# Patient Record
Sex: Female | Born: 1945 | Race: Black or African American | Hispanic: No | Smoking: Former smoker
Health system: Southern US, Community
[De-identification: ages and names within clinical notes are randomized; demographics above are authoritative.]

## PROBLEM LIST (undated history)

## (undated) DIAGNOSIS — E785 Hyperlipidemia, unspecified: Secondary | ICD-10-CM

## (undated) DIAGNOSIS — G8929 Other chronic pain: Secondary | ICD-10-CM

## (undated) DIAGNOSIS — R51 Headache: Secondary | ICD-10-CM

## (undated) DIAGNOSIS — M549 Dorsalgia, unspecified: Secondary | ICD-10-CM

## (undated) DIAGNOSIS — N200 Calculus of kidney: Secondary | ICD-10-CM

## (undated) DIAGNOSIS — K5730 Diverticulosis of large intestine without perforation or abscess without bleeding: Secondary | ICD-10-CM

## (undated) DIAGNOSIS — F40240 Claustrophobia: Secondary | ICD-10-CM

## (undated) DIAGNOSIS — F419 Anxiety disorder, unspecified: Secondary | ICD-10-CM

## (undated) DIAGNOSIS — I10 Essential (primary) hypertension: Secondary | ICD-10-CM

## (undated) DIAGNOSIS — R351 Nocturia: Secondary | ICD-10-CM

## (undated) DIAGNOSIS — K649 Unspecified hemorrhoids: Secondary | ICD-10-CM

## (undated) DIAGNOSIS — M25511 Pain in right shoulder: Secondary | ICD-10-CM

## (undated) DIAGNOSIS — D649 Anemia, unspecified: Secondary | ICD-10-CM

## (undated) DIAGNOSIS — D1720 Benign lipomatous neoplasm of skin and subcutaneous tissue of unspecified limb: Secondary | ICD-10-CM

## (undated) DIAGNOSIS — R519 Headache, unspecified: Secondary | ICD-10-CM

## (undated) DIAGNOSIS — N1330 Unspecified hydronephrosis: Secondary | ICD-10-CM

## (undated) DIAGNOSIS — N261 Atrophy of kidney (terminal): Secondary | ICD-10-CM

## (undated) DIAGNOSIS — N183 Chronic kidney disease, stage 3 unspecified: Secondary | ICD-10-CM

## (undated) DIAGNOSIS — G629 Polyneuropathy, unspecified: Secondary | ICD-10-CM

## (undated) DIAGNOSIS — Z860101 Personal history of adenomatous and serrated colon polyps: Secondary | ICD-10-CM

## (undated) DIAGNOSIS — F32A Depression, unspecified: Secondary | ICD-10-CM

## (undated) DIAGNOSIS — M25512 Pain in left shoulder: Secondary | ICD-10-CM

## (undated) DIAGNOSIS — M5116 Intervertebral disc disorders with radiculopathy, lumbar region: Secondary | ICD-10-CM

## (undated) DIAGNOSIS — R600 Localized edema: Secondary | ICD-10-CM

## (undated) DIAGNOSIS — N201 Calculus of ureter: Secondary | ICD-10-CM

## (undated) DIAGNOSIS — F329 Major depressive disorder, single episode, unspecified: Secondary | ICD-10-CM

## (undated) DIAGNOSIS — N281 Cyst of kidney, acquired: Secondary | ICD-10-CM

## (undated) DIAGNOSIS — K219 Gastro-esophageal reflux disease without esophagitis: Secondary | ICD-10-CM

## (undated) DIAGNOSIS — G4733 Obstructive sleep apnea (adult) (pediatric): Secondary | ICD-10-CM

## (undated) DIAGNOSIS — M5430 Sciatica, unspecified side: Secondary | ICD-10-CM

## (undated) DIAGNOSIS — M1990 Unspecified osteoarthritis, unspecified site: Secondary | ICD-10-CM

## (undated) DIAGNOSIS — Z86010 Personal history of colonic polyps: Secondary | ICD-10-CM

## (undated) DIAGNOSIS — E119 Type 2 diabetes mellitus without complications: Secondary | ICD-10-CM

## (undated) DIAGNOSIS — K760 Fatty (change of) liver, not elsewhere classified: Secondary | ICD-10-CM

## (undated) DIAGNOSIS — Z87442 Personal history of urinary calculi: Secondary | ICD-10-CM

## (undated) DIAGNOSIS — E611 Iron deficiency: Secondary | ICD-10-CM

## (undated) DIAGNOSIS — I679 Cerebrovascular disease, unspecified: Secondary | ICD-10-CM

## (undated) DIAGNOSIS — C801 Malignant (primary) neoplasm, unspecified: Secondary | ICD-10-CM

## (undated) HISTORY — PX: COLONOSCOPY: SHX174

## (undated) HISTORY — PX: CARDIAC CATHETERIZATION: SHX172

## (undated) HISTORY — DX: Benign lipomatous neoplasm of skin and subcutaneous tissue of unspecified limb: D17.20

## (undated) HISTORY — DX: Hyperlipidemia, unspecified: E78.5

## (undated) HISTORY — PX: PERCUTANEOUS NEPHROSTOLITHOTOMY: SHX2207

## (undated) HISTORY — DX: Essential (primary) hypertension: I10

## (undated) HISTORY — DX: Anxiety disorder, unspecified: F41.9

## (undated) HISTORY — PX: VAGINAL HYSTERECTOMY: SUR661

---

## 2000-10-18 ENCOUNTER — Ambulatory Visit (HOSPITAL_COMMUNITY): Admission: RE | Admit: 2000-10-18 | Discharge: 2000-10-18 | Payer: Self-pay | Admitting: *Deleted

## 2000-10-18 ENCOUNTER — Encounter: Payer: Self-pay | Admitting: Family Medicine

## 2000-10-25 ENCOUNTER — Other Ambulatory Visit: Admission: RE | Admit: 2000-10-25 | Discharge: 2000-10-25 | Payer: Self-pay | Admitting: Family Medicine

## 2000-12-01 ENCOUNTER — Emergency Department (HOSPITAL_COMMUNITY): Admission: EM | Admit: 2000-12-01 | Discharge: 2000-12-01 | Payer: Self-pay | Admitting: Emergency Medicine

## 2000-12-10 ENCOUNTER — Encounter: Payer: Self-pay | Admitting: Family Medicine

## 2000-12-10 ENCOUNTER — Ambulatory Visit (HOSPITAL_COMMUNITY): Admission: RE | Admit: 2000-12-10 | Discharge: 2000-12-10 | Payer: Self-pay | Admitting: Family Medicine

## 2000-12-11 ENCOUNTER — Ambulatory Visit (HOSPITAL_COMMUNITY): Admission: RE | Admit: 2000-12-11 | Discharge: 2000-12-11 | Payer: Self-pay | Admitting: General Surgery

## 2001-01-15 ENCOUNTER — Emergency Department (HOSPITAL_COMMUNITY): Admission: EM | Admit: 2001-01-15 | Discharge: 2001-01-15 | Payer: Self-pay | Admitting: Internal Medicine

## 2001-01-20 ENCOUNTER — Encounter: Admission: RE | Admit: 2001-01-20 | Discharge: 2001-01-20 | Payer: Self-pay | Admitting: Neurosurgery

## 2001-01-20 ENCOUNTER — Encounter: Payer: Self-pay | Admitting: Neurosurgery

## 2001-02-03 ENCOUNTER — Encounter: Admission: RE | Admit: 2001-02-03 | Discharge: 2001-02-03 | Payer: Self-pay | Admitting: Neurosurgery

## 2001-02-03 ENCOUNTER — Ambulatory Visit (HOSPITAL_COMMUNITY): Admission: RE | Admit: 2001-02-03 | Discharge: 2001-02-03 | Payer: Self-pay | Admitting: Orthopaedic Surgery

## 2001-02-03 ENCOUNTER — Encounter: Payer: Self-pay | Admitting: Orthopaedic Surgery

## 2001-02-03 ENCOUNTER — Encounter: Payer: Self-pay | Admitting: Neurosurgery

## 2001-02-17 ENCOUNTER — Encounter: Admission: RE | Admit: 2001-02-17 | Discharge: 2001-02-17 | Payer: Self-pay | Admitting: Neurosurgery

## 2001-02-17 ENCOUNTER — Encounter: Payer: Self-pay | Admitting: Neurosurgery

## 2001-02-28 ENCOUNTER — Emergency Department (HOSPITAL_COMMUNITY): Admission: EM | Admit: 2001-02-28 | Discharge: 2001-02-28 | Payer: Self-pay | Admitting: *Deleted

## 2001-02-28 ENCOUNTER — Encounter: Payer: Self-pay | Admitting: *Deleted

## 2001-03-28 ENCOUNTER — Ambulatory Visit (HOSPITAL_COMMUNITY): Admission: RE | Admit: 2001-03-28 | Discharge: 2001-03-28 | Payer: Self-pay | Admitting: Family Medicine

## 2001-03-28 ENCOUNTER — Encounter: Payer: Self-pay | Admitting: Family Medicine

## 2001-04-01 ENCOUNTER — Encounter: Payer: Self-pay | Admitting: Family Medicine

## 2001-04-01 ENCOUNTER — Ambulatory Visit (HOSPITAL_COMMUNITY): Admission: RE | Admit: 2001-04-01 | Discharge: 2001-04-01 | Payer: Self-pay | Admitting: Family Medicine

## 2001-04-03 ENCOUNTER — Ambulatory Visit (HOSPITAL_COMMUNITY): Admission: RE | Admit: 2001-04-03 | Discharge: 2001-04-03 | Payer: Self-pay | Admitting: Family Medicine

## 2001-04-03 ENCOUNTER — Encounter: Payer: Self-pay | Admitting: Family Medicine

## 2001-04-30 ENCOUNTER — Ambulatory Visit (HOSPITAL_COMMUNITY): Admission: RE | Admit: 2001-04-30 | Discharge: 2001-04-30 | Payer: Self-pay | Admitting: General Surgery

## 2001-05-01 ENCOUNTER — Encounter: Payer: Self-pay | Admitting: Urology

## 2001-05-01 ENCOUNTER — Ambulatory Visit (HOSPITAL_COMMUNITY): Admission: RE | Admit: 2001-05-01 | Discharge: 2001-05-01 | Payer: Self-pay | Admitting: Urology

## 2001-11-05 ENCOUNTER — Encounter: Payer: Self-pay | Admitting: Family Medicine

## 2001-11-05 ENCOUNTER — Ambulatory Visit (HOSPITAL_COMMUNITY): Admission: RE | Admit: 2001-11-05 | Discharge: 2001-11-05 | Payer: Self-pay | Admitting: Family Medicine

## 2001-11-07 ENCOUNTER — Ambulatory Visit (HOSPITAL_COMMUNITY): Admission: RE | Admit: 2001-11-07 | Discharge: 2001-11-07 | Payer: Self-pay | Admitting: Family Medicine

## 2001-11-07 ENCOUNTER — Encounter: Payer: Self-pay | Admitting: Family Medicine

## 2001-11-29 ENCOUNTER — Emergency Department (HOSPITAL_COMMUNITY): Admission: EM | Admit: 2001-11-29 | Discharge: 2001-11-29 | Payer: Self-pay | Admitting: *Deleted

## 2002-03-12 ENCOUNTER — Encounter: Payer: Self-pay | Admitting: *Deleted

## 2002-03-12 ENCOUNTER — Emergency Department (HOSPITAL_COMMUNITY): Admission: EM | Admit: 2002-03-12 | Discharge: 2002-03-12 | Payer: Self-pay | Admitting: Emergency Medicine

## 2002-11-23 ENCOUNTER — Ambulatory Visit (HOSPITAL_COMMUNITY): Admission: RE | Admit: 2002-11-23 | Discharge: 2002-11-23 | Payer: Self-pay | Admitting: Family Medicine

## 2002-11-23 ENCOUNTER — Encounter: Payer: Self-pay | Admitting: Family Medicine

## 2003-02-19 ENCOUNTER — Ambulatory Visit (HOSPITAL_COMMUNITY): Admission: RE | Admit: 2003-02-19 | Discharge: 2003-02-19 | Payer: Self-pay | Admitting: Family Medicine

## 2003-02-19 ENCOUNTER — Encounter: Payer: Self-pay | Admitting: Family Medicine

## 2003-02-23 ENCOUNTER — Ambulatory Visit (HOSPITAL_COMMUNITY): Admission: RE | Admit: 2003-02-23 | Discharge: 2003-02-23 | Payer: Self-pay | Admitting: Family Medicine

## 2003-02-23 ENCOUNTER — Encounter: Payer: Self-pay | Admitting: Family Medicine

## 2003-03-04 ENCOUNTER — Encounter: Payer: Self-pay | Admitting: Family Medicine

## 2003-03-04 ENCOUNTER — Ambulatory Visit (HOSPITAL_COMMUNITY): Admission: RE | Admit: 2003-03-04 | Discharge: 2003-03-04 | Payer: Self-pay | Admitting: Family Medicine

## 2003-04-14 ENCOUNTER — Ambulatory Visit (HOSPITAL_COMMUNITY): Admission: RE | Admit: 2003-04-14 | Discharge: 2003-04-14 | Payer: Self-pay | Admitting: Family Medicine

## 2003-05-11 HISTORY — PX: CARDIAC CATHETERIZATION: SHX172

## 2003-07-09 ENCOUNTER — Emergency Department (HOSPITAL_COMMUNITY): Admission: EM | Admit: 2003-07-09 | Discharge: 2003-07-09 | Payer: Self-pay | Admitting: Emergency Medicine

## 2003-11-19 ENCOUNTER — Emergency Department (HOSPITAL_COMMUNITY): Admission: EM | Admit: 2003-11-19 | Discharge: 2003-11-20 | Payer: Self-pay | Admitting: Emergency Medicine

## 2003-11-30 ENCOUNTER — Ambulatory Visit (HOSPITAL_COMMUNITY): Admission: RE | Admit: 2003-11-30 | Discharge: 2003-11-30 | Payer: Self-pay | Admitting: Family Medicine

## 2004-03-13 ENCOUNTER — Ambulatory Visit (HOSPITAL_COMMUNITY): Admission: RE | Admit: 2004-03-13 | Discharge: 2004-03-13 | Payer: Self-pay | Admitting: Neurosurgery

## 2004-05-09 ENCOUNTER — Ambulatory Visit: Payer: Self-pay | Admitting: Family Medicine

## 2004-06-11 HISTORY — PX: OTHER SURGICAL HISTORY: SHX169

## 2004-07-10 ENCOUNTER — Ambulatory Visit: Payer: Self-pay | Admitting: Family Medicine

## 2004-07-24 ENCOUNTER — Emergency Department (HOSPITAL_COMMUNITY): Admission: EM | Admit: 2004-07-24 | Discharge: 2004-07-24 | Payer: Self-pay | Admitting: Emergency Medicine

## 2004-07-25 ENCOUNTER — Ambulatory Visit (HOSPITAL_COMMUNITY): Admission: RE | Admit: 2004-07-25 | Discharge: 2004-07-25 | Payer: Self-pay | Admitting: Emergency Medicine

## 2004-08-10 ENCOUNTER — Ambulatory Visit (HOSPITAL_COMMUNITY): Admission: RE | Admit: 2004-08-10 | Discharge: 2004-08-10 | Payer: Self-pay | Admitting: General Surgery

## 2004-08-10 LAB — HM COLONOSCOPY: HM Colonoscopy: NORMAL

## 2004-09-11 ENCOUNTER — Ambulatory Visit (HOSPITAL_COMMUNITY): Admission: RE | Admit: 2004-09-11 | Discharge: 2004-09-11 | Payer: Self-pay | Admitting: Urology

## 2004-10-09 ENCOUNTER — Emergency Department (HOSPITAL_COMMUNITY): Admission: EM | Admit: 2004-10-09 | Discharge: 2004-10-09 | Payer: Self-pay | Admitting: Emergency Medicine

## 2004-10-13 ENCOUNTER — Observation Stay (HOSPITAL_COMMUNITY): Admission: RE | Admit: 2004-10-13 | Discharge: 2004-10-14 | Payer: Self-pay | Admitting: Urology

## 2004-10-13 HISTORY — PX: OTHER SURGICAL HISTORY: SHX169

## 2004-11-01 ENCOUNTER — Ambulatory Visit (HOSPITAL_COMMUNITY): Admission: RE | Admit: 2004-11-01 | Discharge: 2004-11-01 | Payer: Self-pay | Admitting: Urology

## 2004-12-11 ENCOUNTER — Ambulatory Visit (HOSPITAL_COMMUNITY): Admission: RE | Admit: 2004-12-11 | Discharge: 2004-12-11 | Payer: Self-pay | Admitting: Family Medicine

## 2005-01-19 ENCOUNTER — Ambulatory Visit (HOSPITAL_COMMUNITY): Admission: RE | Admit: 2005-01-19 | Discharge: 2005-01-19 | Payer: Self-pay | Admitting: Urology

## 2005-02-01 ENCOUNTER — Ambulatory Visit: Payer: Self-pay | Admitting: Family Medicine

## 2005-03-12 ENCOUNTER — Ambulatory Visit (HOSPITAL_COMMUNITY): Admission: RE | Admit: 2005-03-12 | Discharge: 2005-03-12 | Payer: Self-pay | Admitting: Urology

## 2005-03-23 ENCOUNTER — Ambulatory Visit: Payer: Self-pay | Admitting: Family Medicine

## 2005-04-03 ENCOUNTER — Ambulatory Visit: Payer: Self-pay | Admitting: Family Medicine

## 2005-04-11 ENCOUNTER — Ambulatory Visit (HOSPITAL_COMMUNITY): Admission: RE | Admit: 2005-04-11 | Discharge: 2005-04-11 | Payer: Self-pay | Admitting: Urology

## 2005-04-20 ENCOUNTER — Ambulatory Visit: Payer: Self-pay | Admitting: Family Medicine

## 2005-05-14 ENCOUNTER — Ambulatory Visit: Payer: Self-pay | Admitting: Family Medicine

## 2005-07-23 ENCOUNTER — Ambulatory Visit: Payer: Self-pay | Admitting: Family Medicine

## 2005-08-30 ENCOUNTER — Ambulatory Visit (HOSPITAL_COMMUNITY): Admission: RE | Admit: 2005-08-30 | Discharge: 2005-08-30 | Payer: Self-pay | Admitting: Family Medicine

## 2005-08-31 ENCOUNTER — Ambulatory Visit: Payer: Self-pay | Admitting: Family Medicine

## 2005-09-20 ENCOUNTER — Ambulatory Visit: Payer: Self-pay | Admitting: Orthopedic Surgery

## 2005-09-25 ENCOUNTER — Encounter (HOSPITAL_COMMUNITY): Admission: RE | Admit: 2005-09-25 | Discharge: 2005-10-25 | Payer: Self-pay | Admitting: Orthopedic Surgery

## 2005-10-23 ENCOUNTER — Ambulatory Visit: Payer: Self-pay | Admitting: Family Medicine

## 2005-10-30 ENCOUNTER — Encounter (HOSPITAL_COMMUNITY): Admission: RE | Admit: 2005-10-30 | Discharge: 2005-11-29 | Payer: Self-pay | Admitting: Orthopedic Surgery

## 2005-11-19 ENCOUNTER — Ambulatory Visit: Payer: Self-pay | Admitting: Orthopedic Surgery

## 2005-11-30 ENCOUNTER — Ambulatory Visit: Admission: RE | Admit: 2005-11-30 | Discharge: 2005-11-30 | Payer: Self-pay | Admitting: Orthopedic Surgery

## 2005-12-05 ENCOUNTER — Ambulatory Visit: Payer: Self-pay | Admitting: Family Medicine

## 2005-12-13 ENCOUNTER — Ambulatory Visit (HOSPITAL_COMMUNITY): Admission: RE | Admit: 2005-12-13 | Discharge: 2005-12-13 | Payer: Self-pay | Admitting: Family Medicine

## 2005-12-24 ENCOUNTER — Ambulatory Visit: Payer: Self-pay | Admitting: Orthopedic Surgery

## 2006-01-31 ENCOUNTER — Ambulatory Visit: Payer: Self-pay | Admitting: Family Medicine

## 2006-04-23 ENCOUNTER — Ambulatory Visit: Payer: Self-pay | Admitting: Family Medicine

## 2006-04-23 ENCOUNTER — Encounter: Payer: Self-pay | Admitting: Family Medicine

## 2006-04-23 ENCOUNTER — Encounter (INDEPENDENT_AMBULATORY_CARE_PROVIDER_SITE_OTHER): Payer: Self-pay | Admitting: *Deleted

## 2006-04-23 ENCOUNTER — Other Ambulatory Visit: Admission: RE | Admit: 2006-04-23 | Discharge: 2006-04-23 | Payer: Self-pay | Admitting: Family Medicine

## 2006-04-23 LAB — CONVERTED CEMR LAB: Pap Smear: NORMAL

## 2006-04-29 ENCOUNTER — Emergency Department (HOSPITAL_COMMUNITY): Admission: EM | Admit: 2006-04-29 | Discharge: 2006-04-29 | Payer: Self-pay | Admitting: Emergency Medicine

## 2006-04-29 ENCOUNTER — Ambulatory Visit (HOSPITAL_COMMUNITY): Admission: RE | Admit: 2006-04-29 | Discharge: 2006-04-29 | Payer: Self-pay | Admitting: Family Medicine

## 2006-06-25 ENCOUNTER — Ambulatory Visit (HOSPITAL_COMMUNITY): Admission: RE | Admit: 2006-06-25 | Discharge: 2006-06-25 | Payer: Self-pay | Admitting: Urology

## 2006-07-09 ENCOUNTER — Ambulatory Visit: Payer: Self-pay | Admitting: Family Medicine

## 2006-07-11 ENCOUNTER — Ambulatory Visit (HOSPITAL_COMMUNITY): Admission: RE | Admit: 2006-07-11 | Discharge: 2006-07-11 | Payer: Self-pay | Admitting: Family Medicine

## 2006-07-16 ENCOUNTER — Encounter (HOSPITAL_COMMUNITY): Admission: RE | Admit: 2006-07-16 | Discharge: 2006-08-15 | Payer: Self-pay | Admitting: Family Medicine

## 2006-07-23 ENCOUNTER — Ambulatory Visit: Payer: Self-pay | Admitting: Gastroenterology

## 2006-08-05 ENCOUNTER — Ambulatory Visit: Payer: Self-pay | Admitting: Gastroenterology

## 2006-08-09 ENCOUNTER — Encounter: Payer: Self-pay | Admitting: Family Medicine

## 2006-08-09 LAB — CONVERTED CEMR LAB: Hgb A1c MFr Bld: 7 % — ABNORMAL HIGH (ref 4.6–6.1)

## 2006-08-14 ENCOUNTER — Ambulatory Visit: Payer: Self-pay | Admitting: Family Medicine

## 2006-08-21 ENCOUNTER — Ambulatory Visit (HOSPITAL_COMMUNITY): Admission: RE | Admit: 2006-08-21 | Discharge: 2006-08-21 | Payer: Self-pay | Admitting: Gastroenterology

## 2006-08-21 ENCOUNTER — Ambulatory Visit: Payer: Self-pay | Admitting: Gastroenterology

## 2006-08-21 ENCOUNTER — Encounter (INDEPENDENT_AMBULATORY_CARE_PROVIDER_SITE_OTHER): Payer: Self-pay | Admitting: Specialist

## 2006-08-23 IMAGING — MR MR LUMBAR SPINE W/O CM
4 of 6 series · 11 of 48 positions shown · non-contrast
Comparison: none

MR lumbar spine without contrast

Clinical: Low back pain radiating to left lower extremity

[Series 3: T2 · sagittal · 4.0mm · 0.67mm/px · 3 of 12 slices shown (1 of 2)]
[im 3/12]
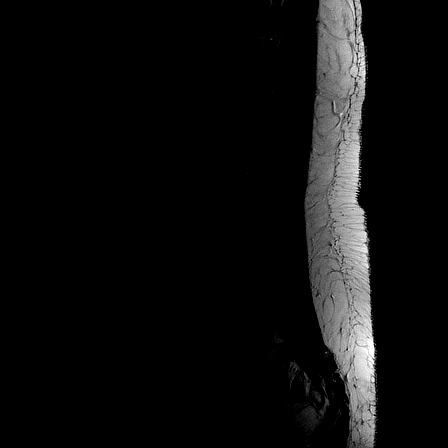
[im 7/12]
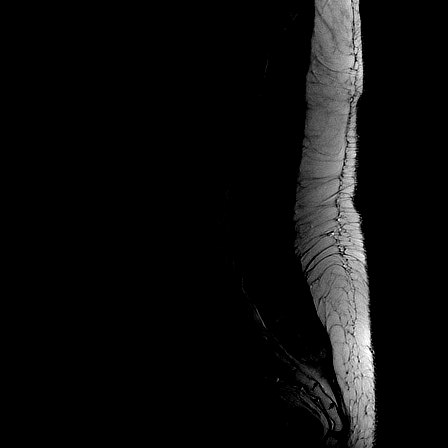
[im 12/12]
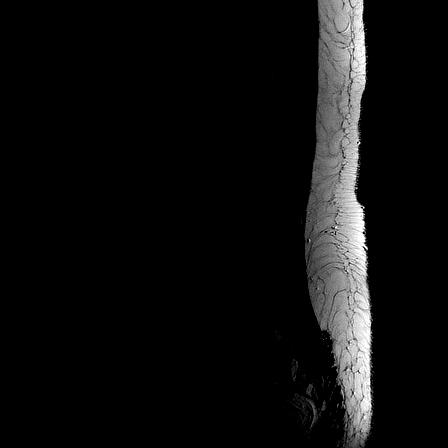

[Series 4: T1 · sagittal · 4.0mm · 0.39mm/px · 3 of 12 slices shown]
[im 3/12]
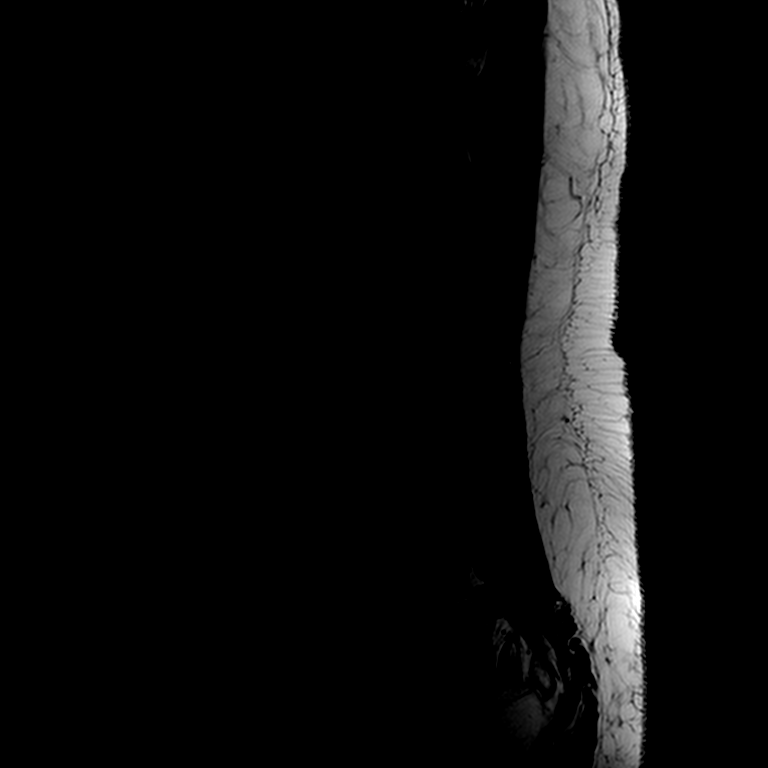
[im 7/12]
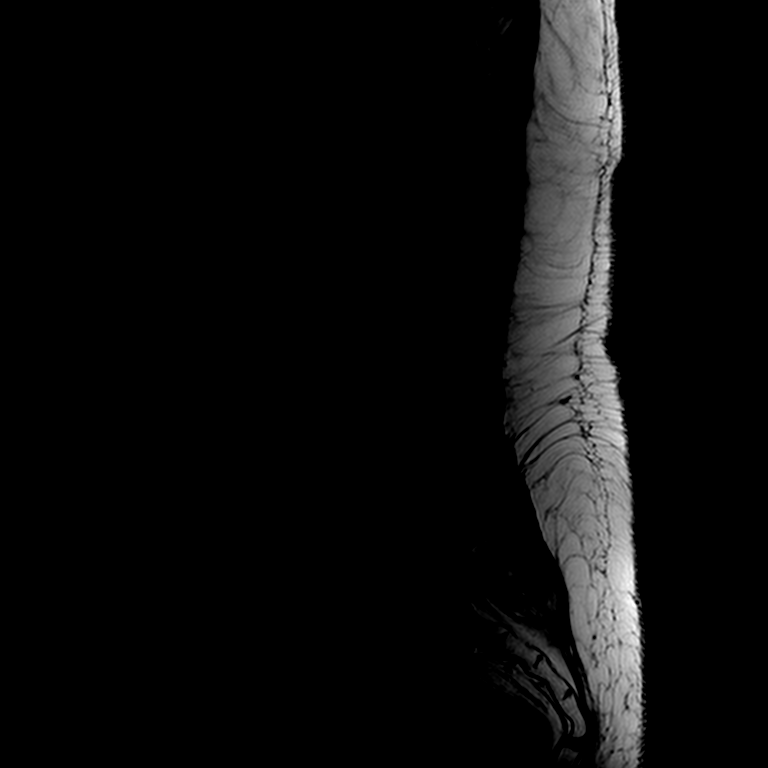
[im 12/12]
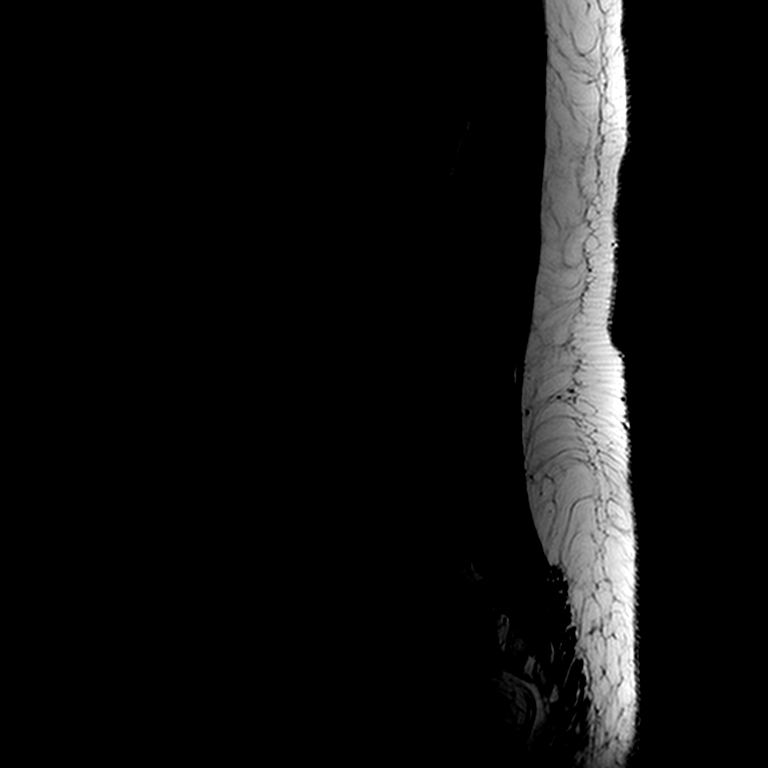

[Series 5: PD · sagittal · 4.0mm · 0.42mm/px · 2 of 12 slices shown]
[im 3/12]
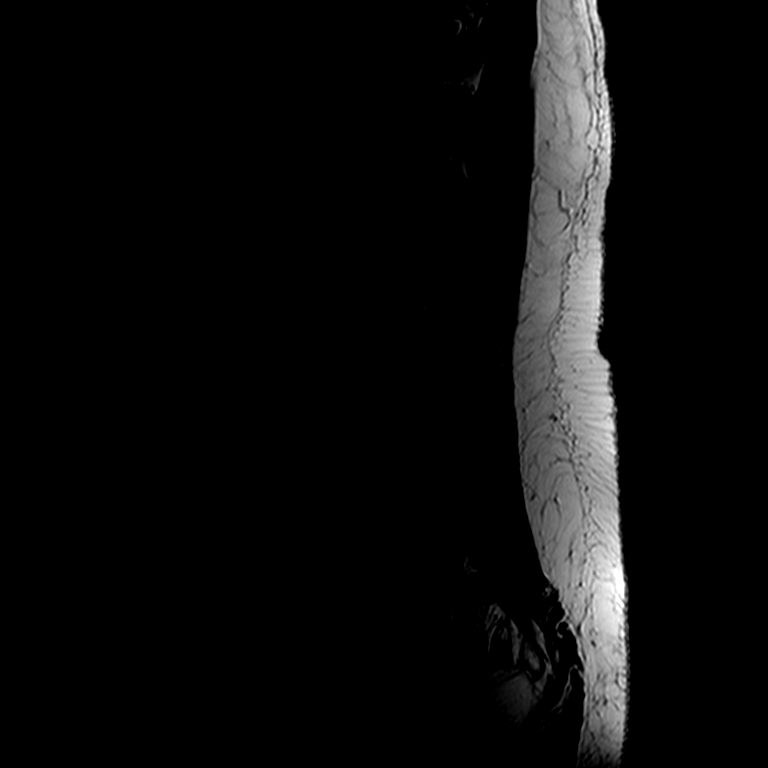
[im 7/12]
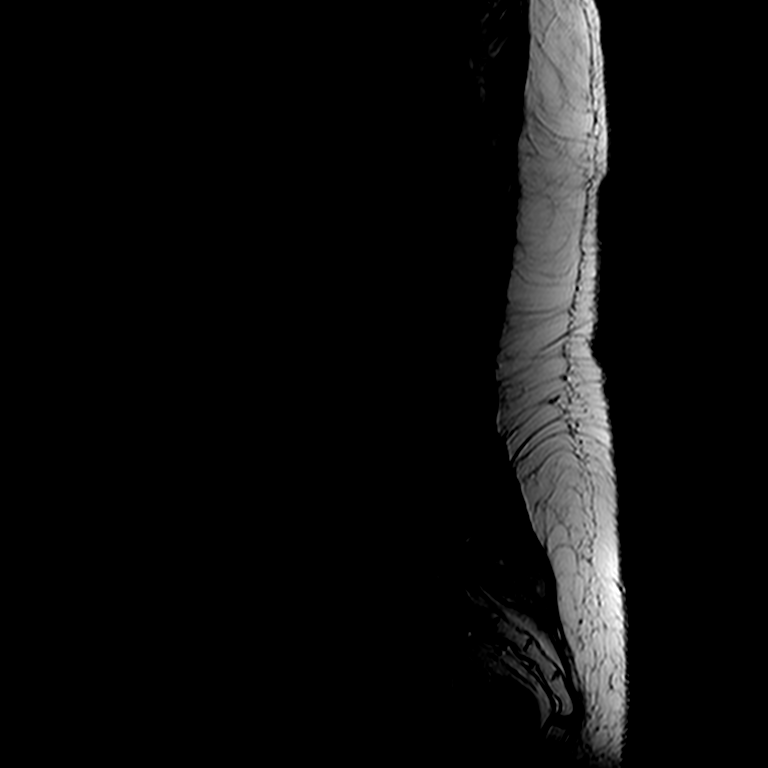

[Series 7: T2 · axial · 4.0mm · 0.26mm/px · z∈[-74,+106]mm · 3 of 26 slices shown (2 of 2)]
[im 5/26]
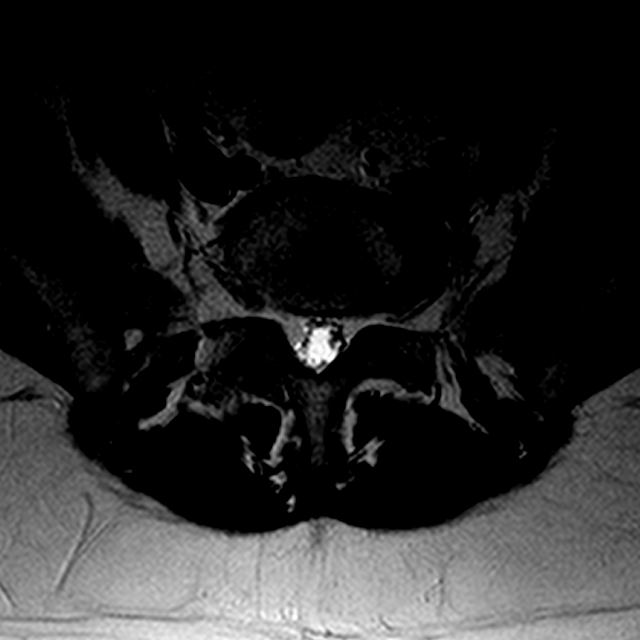
[im 14/26]
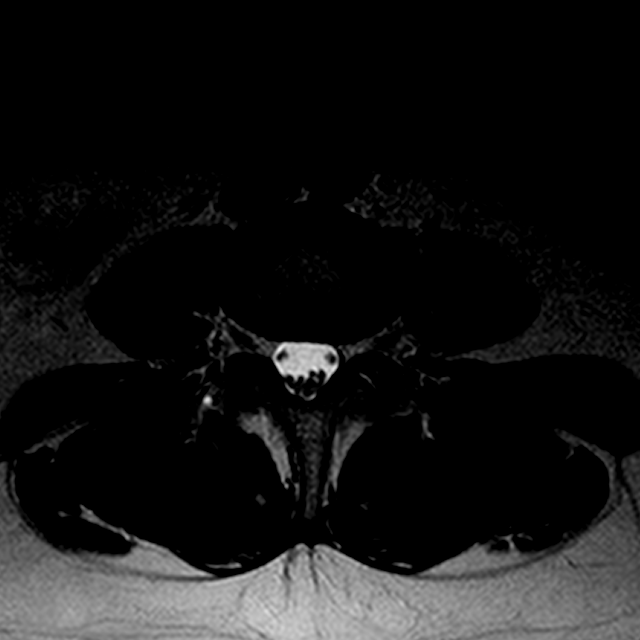
[im 23/26]
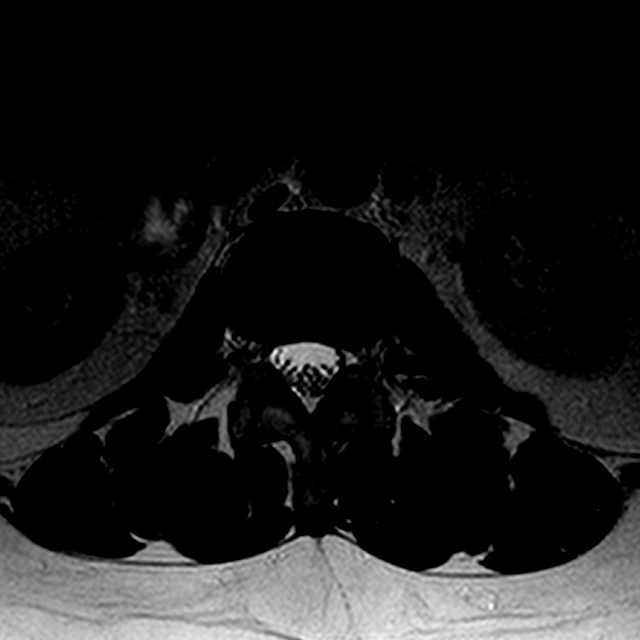

[11 of 48 positions shown; findings below may reference images not displayed]

FINDINGS: There is anatomic alignment of the vertebral bodies. There is no vertebral height loss
bone marrow signal is within normal limits. The conus medullaris is at the L1 to this.

L1-L2: No disc herniation canal stenosis or foraminal stenosis. A tiny right posterior synovial
cyst from the facet joint is noted.

L2-L3: No disc herniation canal stenosis or foraminal stenosis.

L3-L4: Mild concentric disc bulge asymmetric to the left in the foraminal and extraforaminal region
is noted without significant central canal stenosis or foraminal narrowing. Mild facet arthropathy
is noted.

L4-L5: There is a mild concentric disc bulge with a central posterior protrusion. Coupled with
moderate facet arthropathy and ligamentum flavum hypertrophy there is mild central canal stenosis
and moderate bilateral lateral recess narrowing. L5 nerve root symptomatology may be present.
Moderate right foraminal narrowing is present secondary to disc and facet osteophytes. Right L4
nerve root encroachment is noted this has progressed since the prior study.

L5-S1 a concentric disc bulge asymmetric to the left is present. A more focal left anterolateral
disc extrusion is noted not significantly changed. Left L5 nerve root encroachment is stable. This
occurs both in the foramen due to disc osteophytes and in the extraforaminal region secondary to
the asymmetric left broad disc bulge. Little if any facet arthropathy is noted.
IMPRESSION: 1. Mild central stenosis at L4-L5 with bilateral lateral recess narrowing is stable.
2. Right foraminal narrowing at L4-L5 has progressed. L4 nerve root encroachment is present.
3. At L5-S1, there is left L5 nerve root encroachment intraforaminal and extraforaminal, which is
stable.
4. Left anterolateral disc extrusion at L5-S1 is stable

## 2006-08-29 ENCOUNTER — Ambulatory Visit (HOSPITAL_COMMUNITY): Admission: RE | Admit: 2006-08-29 | Discharge: 2006-08-29 | Payer: Self-pay | Admitting: Family Medicine

## 2006-08-29 ENCOUNTER — Ambulatory Visit: Payer: Self-pay | Admitting: Family Medicine

## 2006-09-02 ENCOUNTER — Ambulatory Visit (HOSPITAL_COMMUNITY): Admission: RE | Admit: 2006-09-02 | Discharge: 2006-09-02 | Payer: Self-pay | Admitting: Family Medicine

## 2006-09-16 ENCOUNTER — Encounter: Admission: RE | Admit: 2006-09-16 | Discharge: 2006-09-16 | Payer: Self-pay | Admitting: Family Medicine

## 2006-10-01 ENCOUNTER — Encounter (HOSPITAL_COMMUNITY): Admission: RE | Admit: 2006-10-01 | Discharge: 2006-10-31 | Payer: Self-pay | Admitting: Family Medicine

## 2006-10-02 ENCOUNTER — Ambulatory Visit: Payer: Self-pay | Admitting: Family Medicine

## 2006-10-02 LAB — CONVERTED CEMR LAB: Microalb, Ur: 21.3 mg/dL — ABNORMAL HIGH (ref 0.00–1.89)

## 2006-10-08 ENCOUNTER — Encounter: Payer: Self-pay | Admitting: Family Medicine

## 2006-10-08 LAB — CONVERTED CEMR LAB
ALT: 17 units/L (ref 0–35)
AST: 13 units/L (ref 0–37)
Albumin: 4.2 g/dL (ref 3.5–5.2)
Alkaline Phosphatase: 79 units/L (ref 39–117)
BUN: 19 mg/dL (ref 6–23)
Basophils Absolute: 0 10*3/uL (ref 0.0–0.1)
Basophils Relative: 0 % (ref 0–1)
Bilirubin, Direct: <0.1 mg/dL (ref 0.0–0.3)
CO2: 24 meq/L (ref 19–32)
Calcium: 9.5 mg/dL (ref 8.4–10.5)
Chloride: 103 meq/L (ref 96–112)
Cholesterol: 156 mg/dL (ref 0–200)
Creatinine, Ser: 0.86 mg/dL (ref 0.40–1.20)
Eosinophils Absolute: 0.1 10*3/uL (ref 0.0–0.7)
Eosinophils Relative: 1 % (ref 0–5)
Glucose, Bld: 72 mg/dL (ref 70–99)
HCT: 42 % (ref 36.0–46.0)
HDL: 54 mg/dL (ref >39)
Hemoglobin: 13.4 g/dL (ref 12.0–15.0)
Hgb A1c MFr Bld: 6.5 % — ABNORMAL HIGH (ref 4.6–6.1)
LDL Cholesterol: 76 mg/dL (ref 0–99)
Lymphocytes Relative: 31 % (ref 12–46)
Lymphs Abs: 2.3 10*3/uL (ref 0.7–3.3)
MCHC: 31.9 g/dL (ref 30.0–36.0)
MCV: 95.7 fL (ref 78.0–100.0)
Monocytes Absolute: 0.5 10*3/uL (ref 0.2–0.7)
Monocytes Relative: 7 % (ref 3–11)
Neutro Abs: 4.5 10*3/uL (ref 1.7–7.7)
Neutrophils Relative %: 61 % (ref 43–77)
Platelets: 335 10*3/uL (ref 150–400)
Potassium: 4 meq/L (ref 3.5–5.3)
RBC: 4.39 M/uL (ref 3.87–5.11)
RDW: 15.9 % — ABNORMAL HIGH (ref 11.5–14.0)
Sodium: 139 meq/L (ref 135–145)
Total Bilirubin: 0.3 mg/dL (ref 0.3–1.2)
Total CHOL/HDL Ratio: 2.9
Total Protein: 7.6 g/dL (ref 6.0–8.3)
Triglycerides: 129 mg/dL (ref <150)
VLDL: 26 mg/dL (ref 0–40)
WBC: 7.5 10*3/uL (ref 4.0–10.5)

## 2006-11-05 ENCOUNTER — Encounter (HOSPITAL_COMMUNITY): Admission: RE | Admit: 2006-11-05 | Discharge: 2006-12-05 | Payer: Self-pay | Admitting: Family Medicine

## 2006-11-25 ENCOUNTER — Ambulatory Visit: Payer: Self-pay | Admitting: Family Medicine

## 2007-01-02 ENCOUNTER — Ambulatory Visit: Payer: Self-pay | Admitting: Family Medicine

## 2007-01-03 IMAGING — CR DG KNEE COMPLETE 4+V*L*
4 series · 4 of 4 positions shown · non-contrast
Comparison: none

CLINICAL DATA: No known injury.  Pain and swelling with knot on side and along back.  
FOUR VIEW LEFT KNEE:
Small suprapatellar joint effusion.  Mild degenerative changes.  No fracture or bony destructive lesion.  If it is possible that what the patient is feeling is a popliteal cyst, one may consider further delineation with MR imaging.

[view not recorded (1 of 4)]
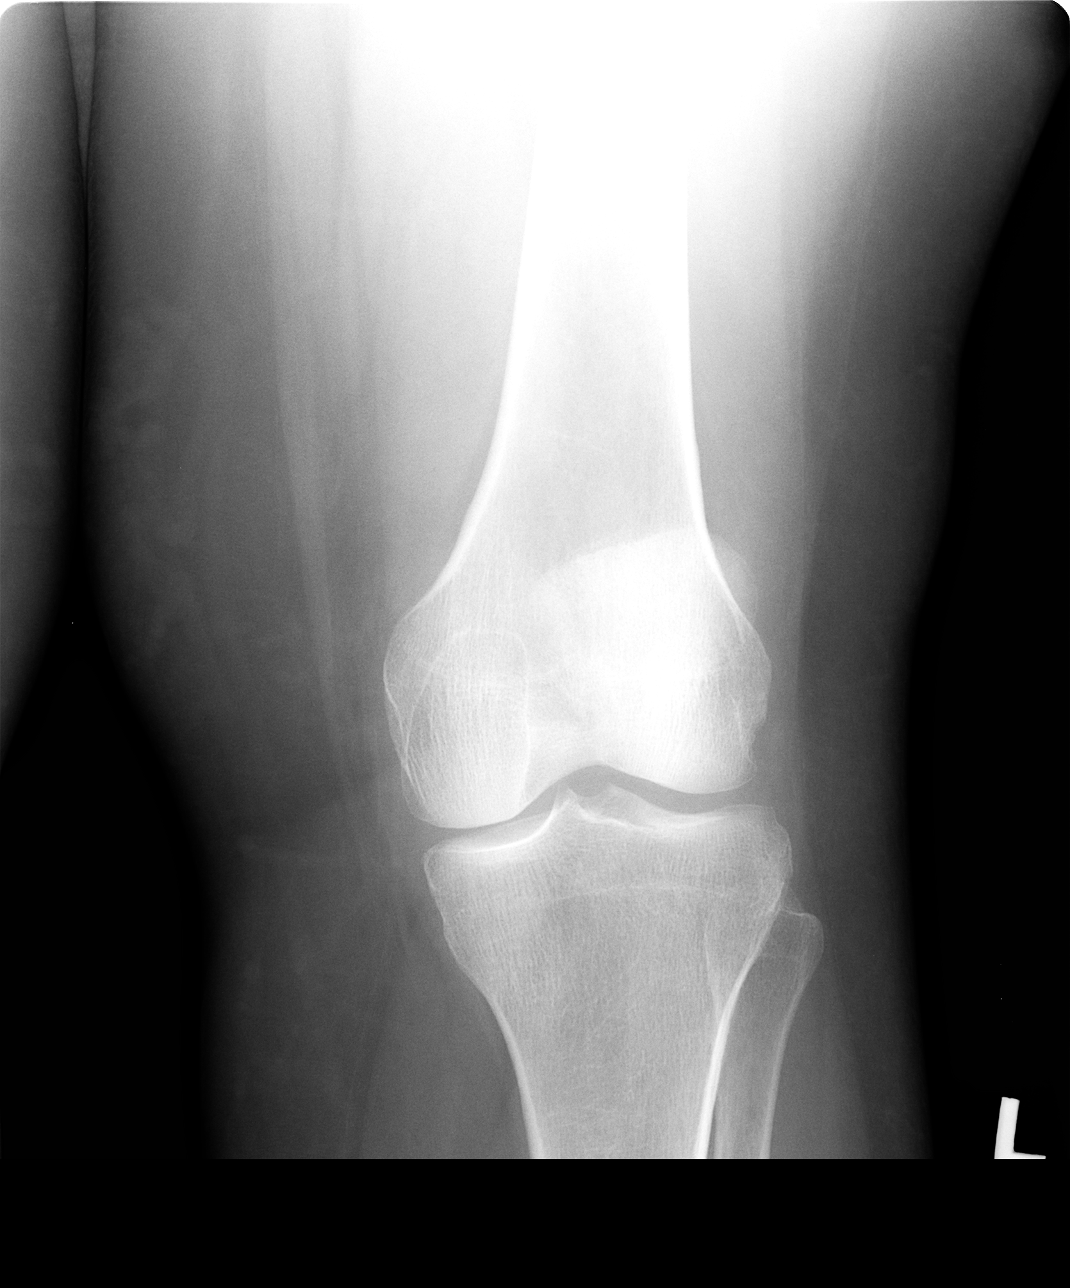

[view not recorded (2 of 4)]
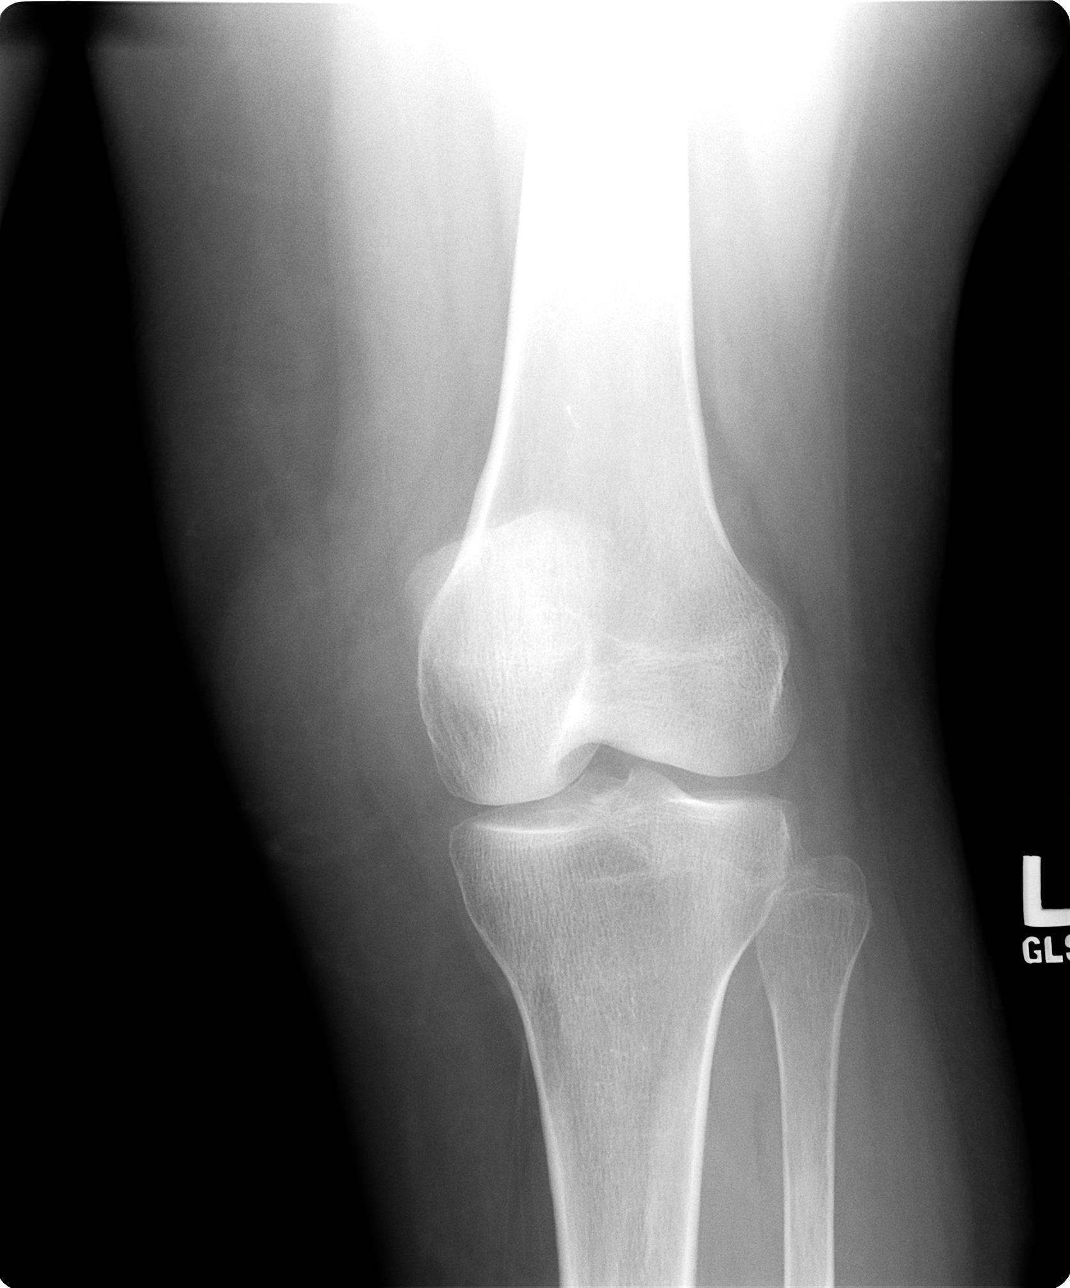

[view not recorded (3 of 4)]
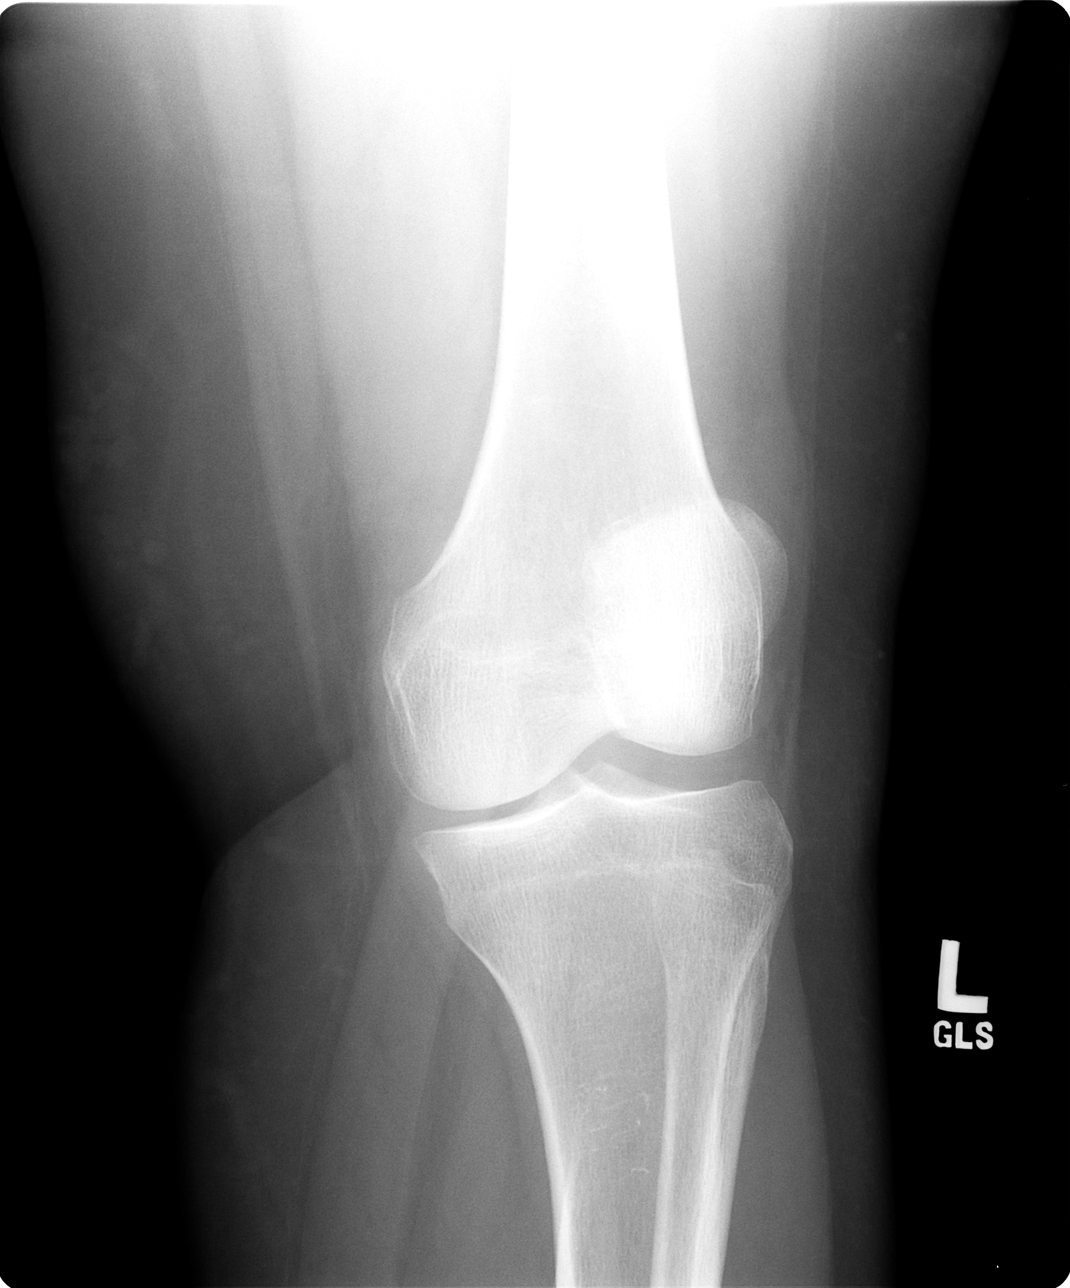

[view not recorded (4 of 4)]
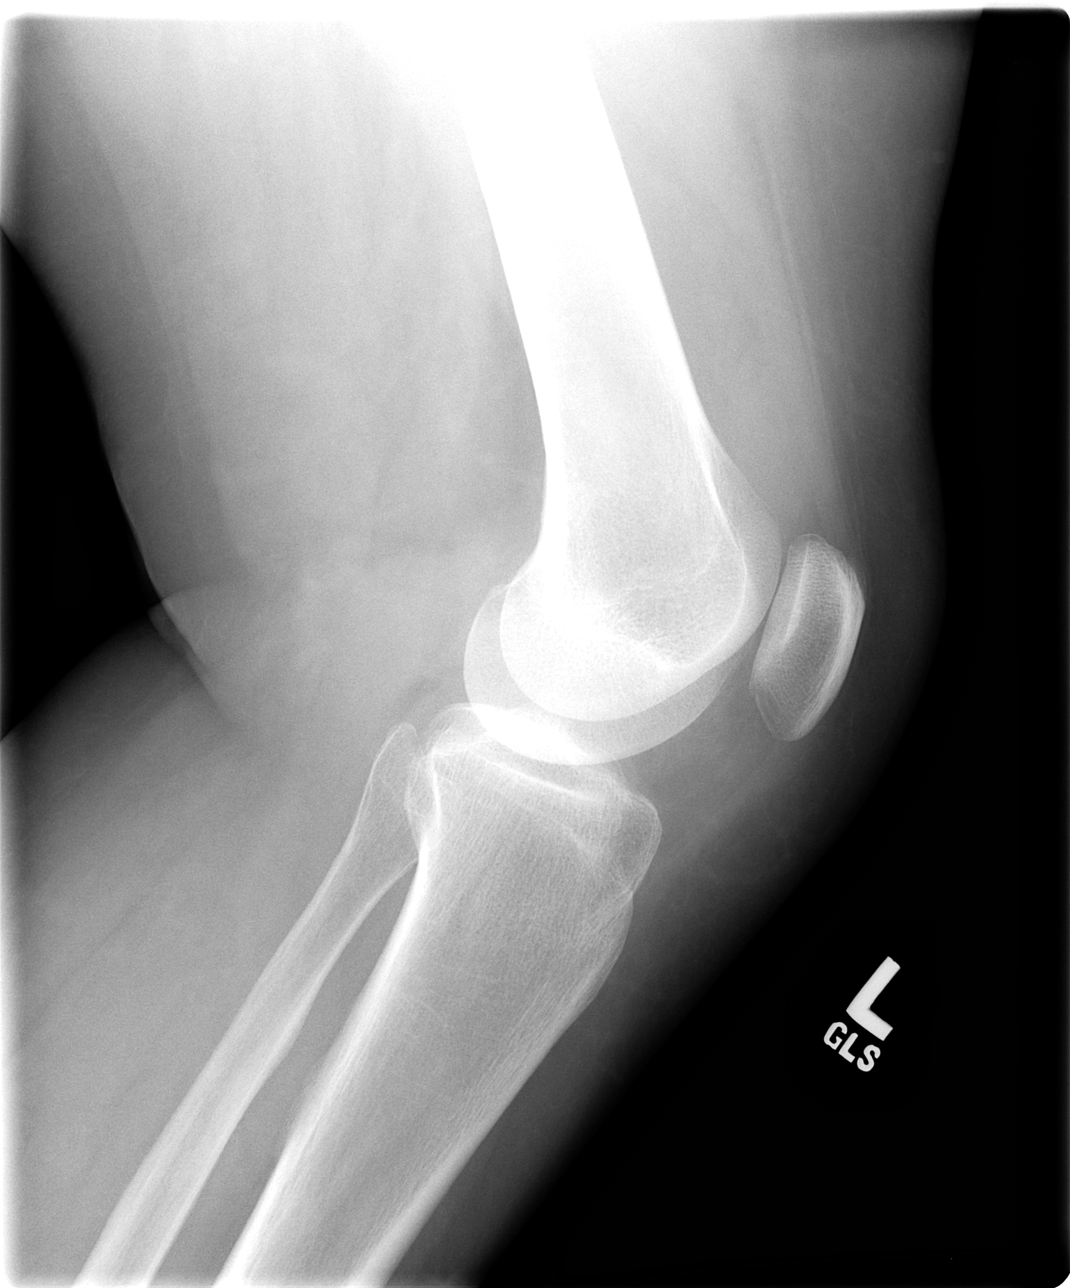

[4 of 4 positions shown; findings below may reference images not displayed]

IMPRESSION: 1.  Mild degenerative changes with small suprapatellar joint effusion.
2.  MR imaging may prove helpful to determine if popliteal cyst is a cause of the patient?s perceived knot along side and back of knee (and rule out other lesions).

## 2007-01-14 ENCOUNTER — Ambulatory Visit: Payer: Self-pay | Admitting: Family Medicine

## 2007-01-15 ENCOUNTER — Encounter: Payer: Self-pay | Admitting: Family Medicine

## 2007-01-17 ENCOUNTER — Ambulatory Visit (HOSPITAL_COMMUNITY): Admission: RE | Admit: 2007-01-17 | Discharge: 2007-01-17 | Payer: Self-pay | Admitting: Family Medicine

## 2007-01-24 ENCOUNTER — Ambulatory Visit: Payer: Self-pay | Admitting: Gastroenterology

## 2007-02-13 ENCOUNTER — Ambulatory Visit: Payer: Self-pay | Admitting: Family Medicine

## 2007-02-21 IMAGING — CT CT ABDOMEN W/O CM
1 of 2 series · 15 of 32 positions shown, 19 images · non-contrast
Comparison: none

HISTORY: Right flank pain and hematuria

[Series 5376: — · axial · 0.73mm/px · z∈[+1136,+1566]mm · 15 of 94 slices shown, 19 images]
[im 4/94  soft-tissue]
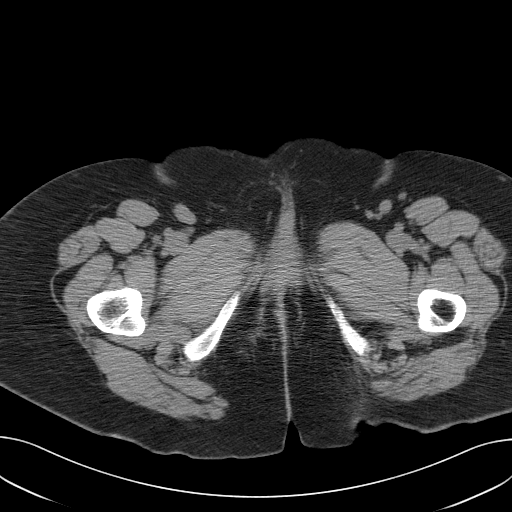
[im 4/94  bone]
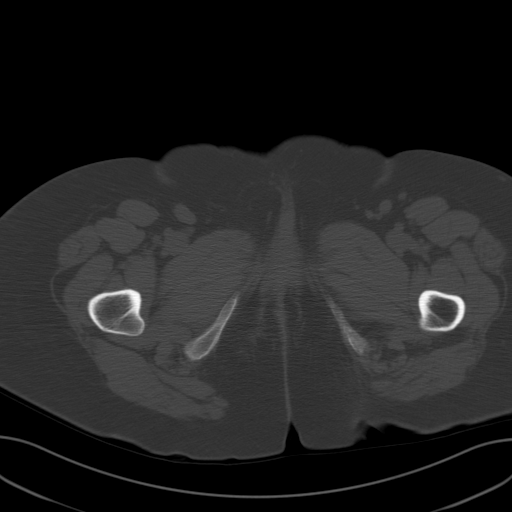
[im 12/94  soft-tissue]
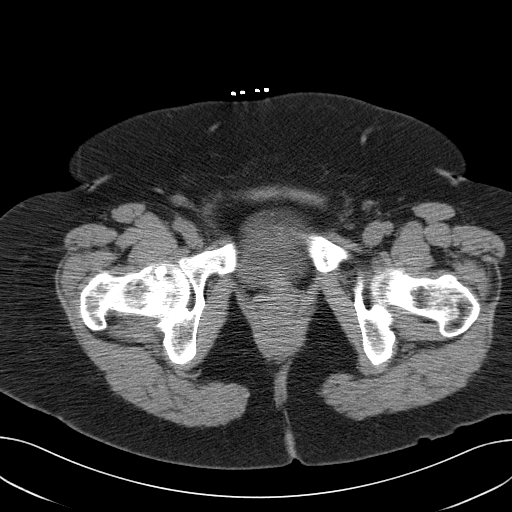
[im 20/94  soft-tissue]
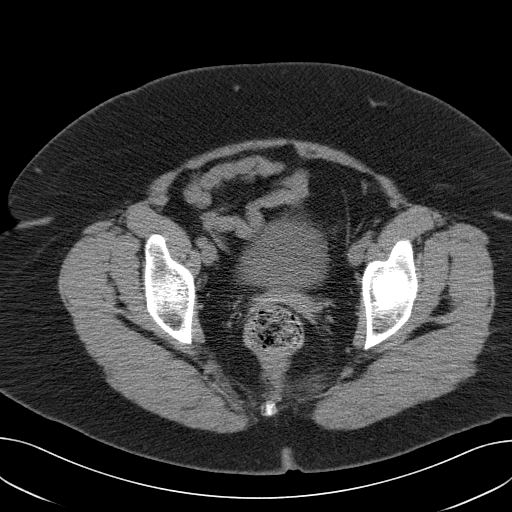
[im 28/94  soft-tissue]
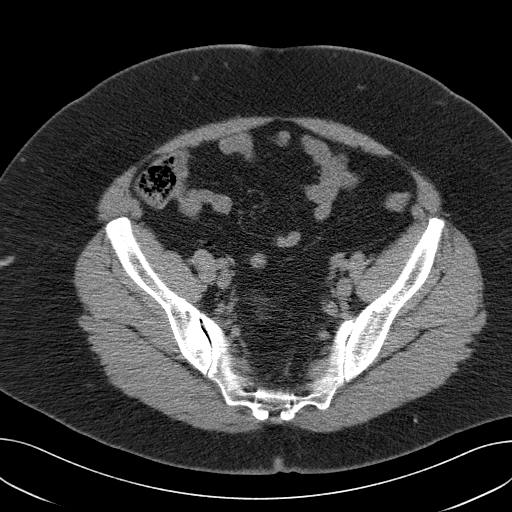
[im 32/94  soft-tissue]
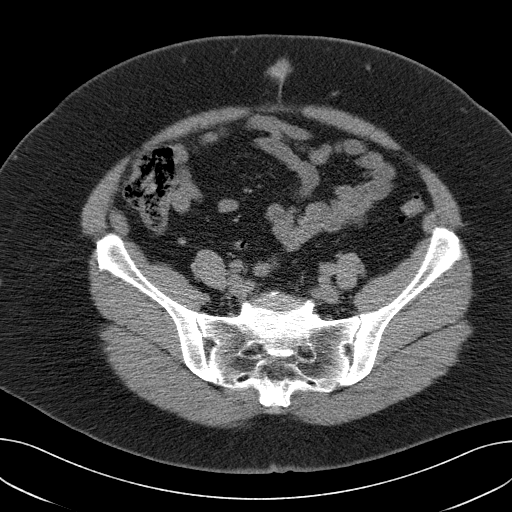
[im 39/94  soft-tissue]
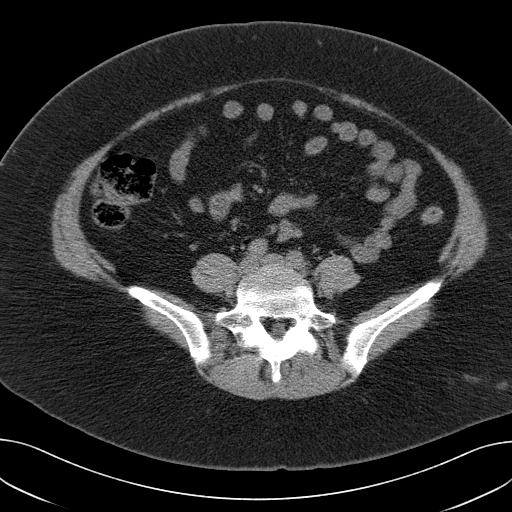
[im 47/94  soft-tissue]
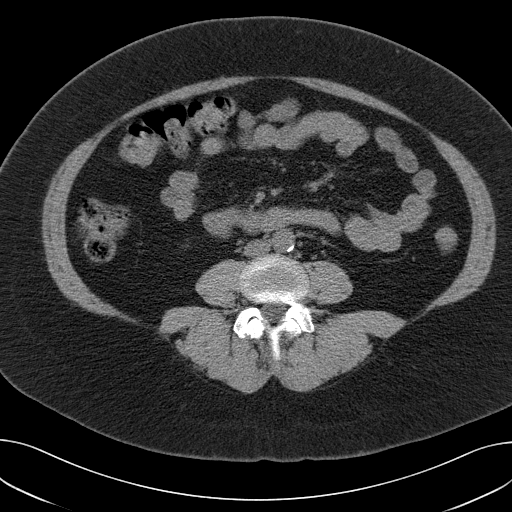
[im 55/94  soft-tissue]
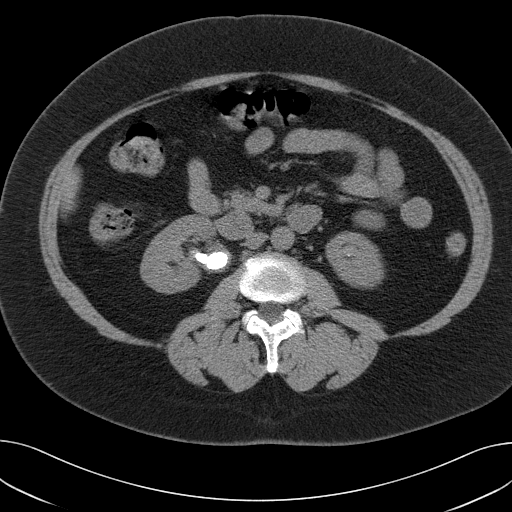
[im 63/94  soft-tissue]
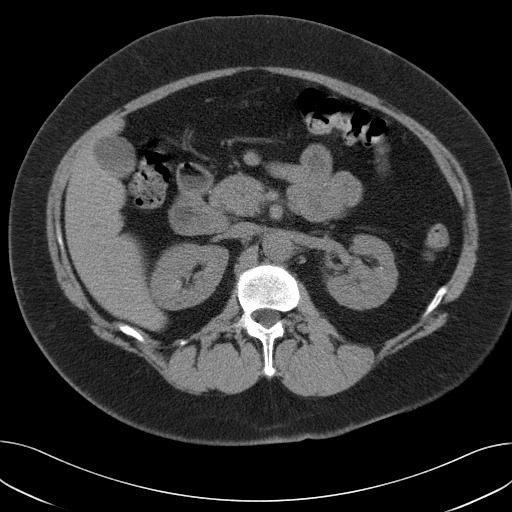
[im 63/94  bone]
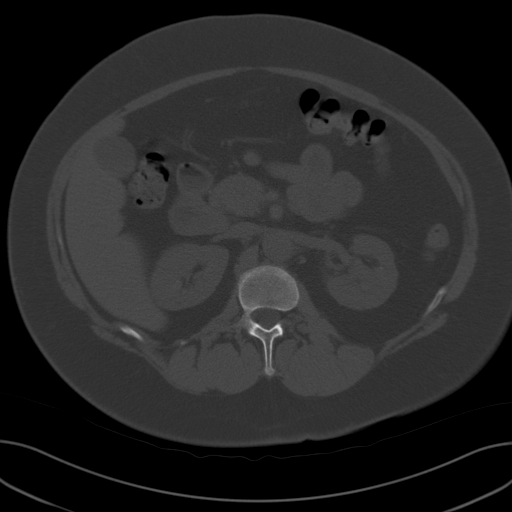
[im 66/94  soft-tissue]
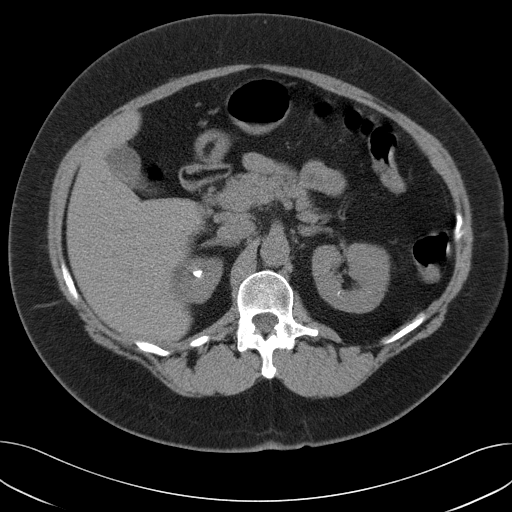
[im 74/94  soft-tissue]
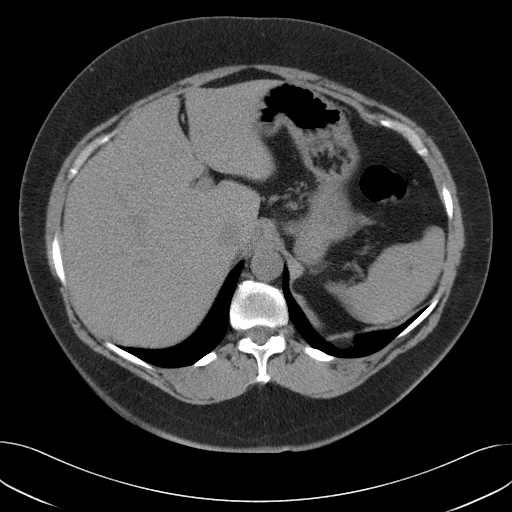
[im 78/94  lung]
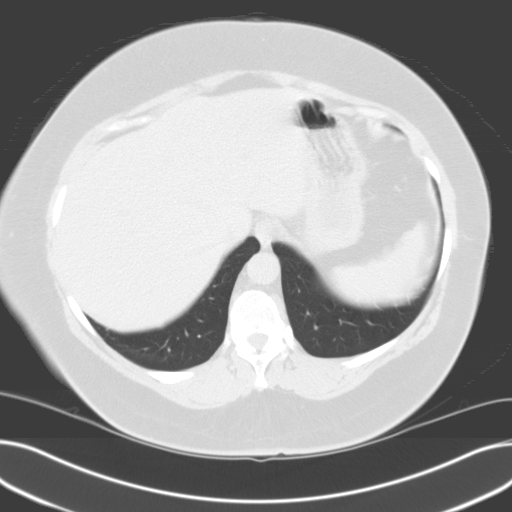
[im 82/94  soft-tissue]
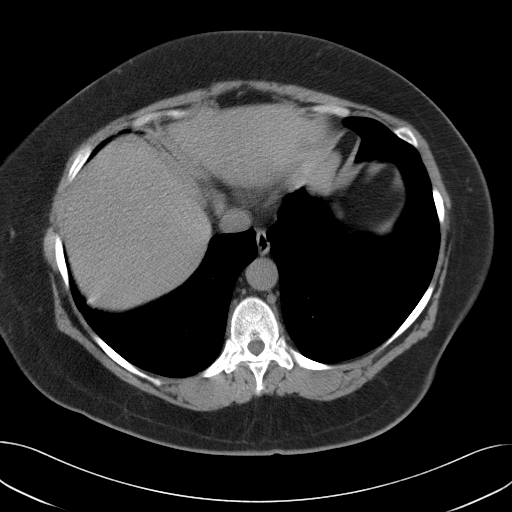
[im 82/94  lung]
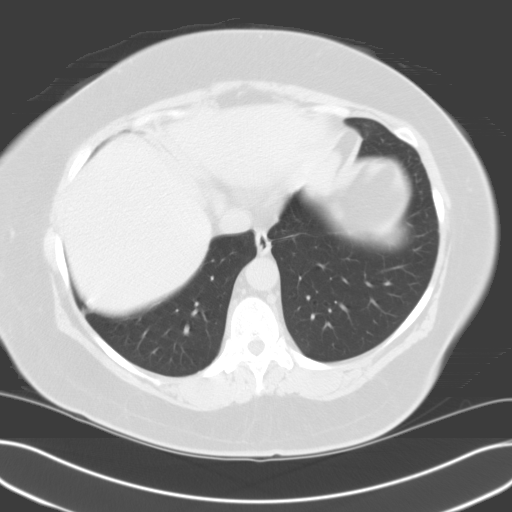
[im 86/94  lung]
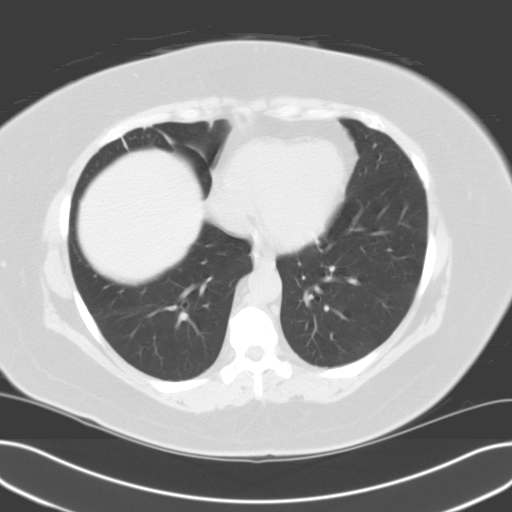
[im 90/94  soft-tissue]
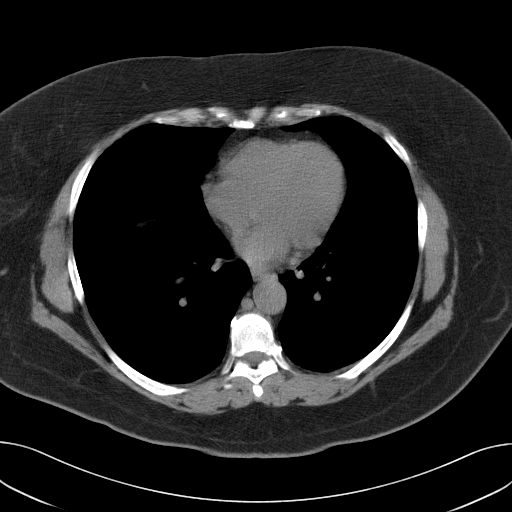
[im 90/94  lung]
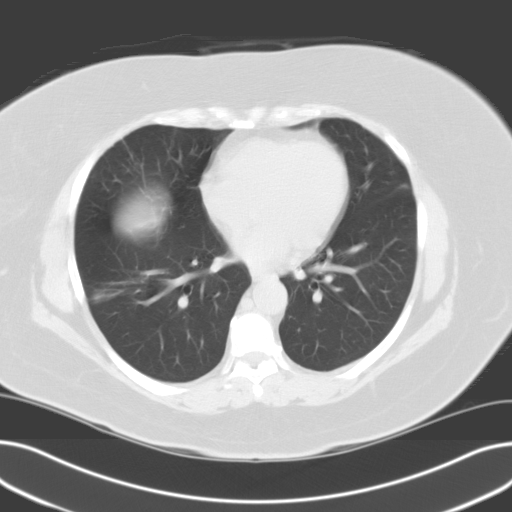

[15 of 32 positions shown; findings below may reference images not displayed]

CT ABDOMEN AND PELVIS WITHOUT CONTRAST:

Multidetector helical CT imaging of abdomen and pelvis performed using kidney
stone protocol. 
Neither oral nor intravenous contrast utilized for this indication.
No prior study available for comparison.

CT ABDOMEN:

Lung bases clear.
Multiple bilateral renal calculi, including largest calculus at the right renal
pelvis, [DATE] x 1.0 cm, image 40.
Largest left renal stone is at the left renal pelvis, 13 x 9 mm, image 35.
Minimal collecting system dilatation in right kidney.
No ureteral dilatation.
Remaining solid organs and bowel loops unremarkable for exam lacking IV and oral
contrast.
IMPRESSION: Multiple bilateral renal calculi including a 15 x 10 mm diameter calculus at
right renal pelvis.
Minimal collecting system dilatation right kidney.

CT PELVIS:

No distal ureteral calcification or dilatation.
Normal appendix.
No mass, adenopathy, free fluid, or hernia.
Pelvic bowel loops grossly normal.
IMPRESSION: No additional abnormalities.

## 2007-03-21 IMAGING — CT CT PELVIS W/O CM
1 of 2 series · 15 of 32 positions shown, 19 images · non-contrast
Comparison: 09/11/04.
CT ABDOMEN WITHOUT CONTRAST:

CLINICAL DATA: Abdominal and pelvic pain.  Fever and vomiting.  History of recent lithotripsy.
TECHNIQUE: Multidetector helical CT scanning was obtained through the abdomen and pelvis.

[Series 8183: — · axial · 0.68mm/px · z∈[+1174,+1628]mm · 15 of 99 slices shown, 19 images]
[im 4/99  soft-tissue]
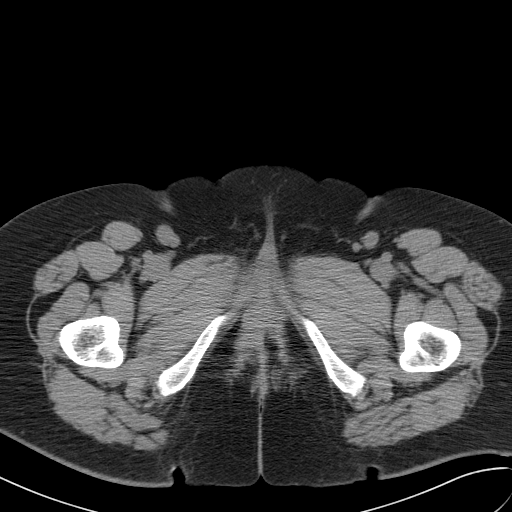
[im 4/99  bone]
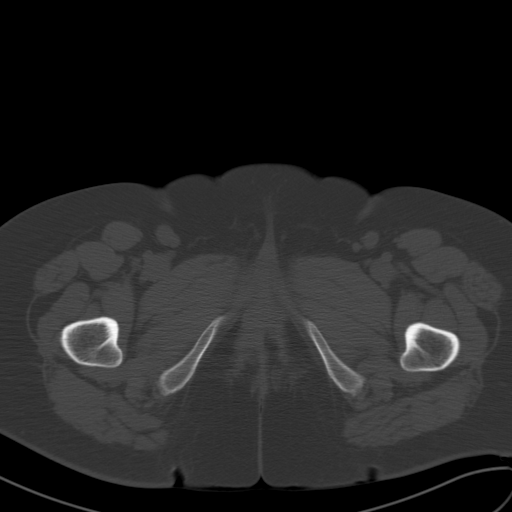
[im 12/99  soft-tissue]
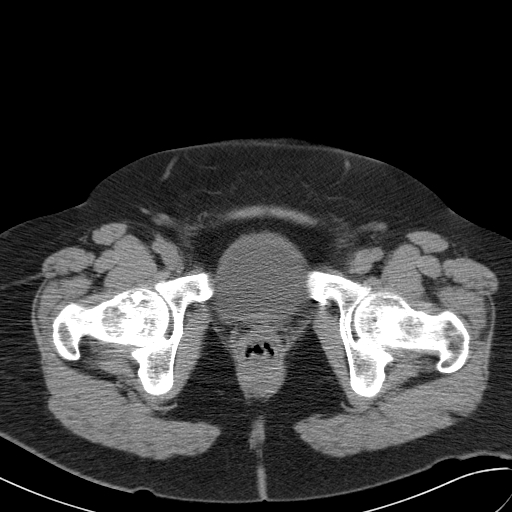
[im 20/99  soft-tissue]
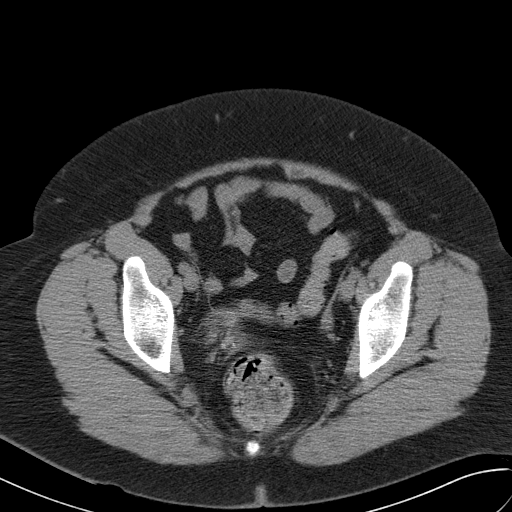
[im 28/99  soft-tissue]
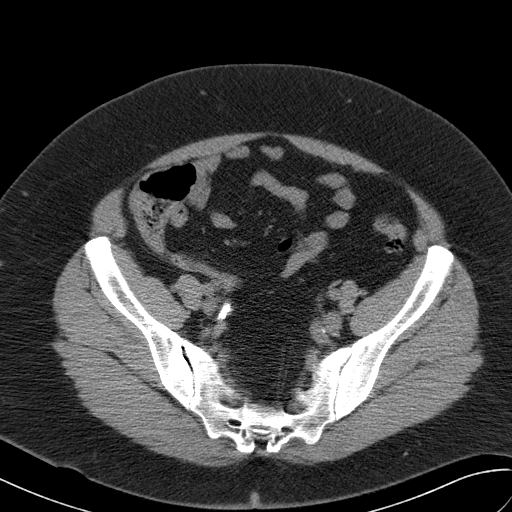
[im 36/99  soft-tissue]
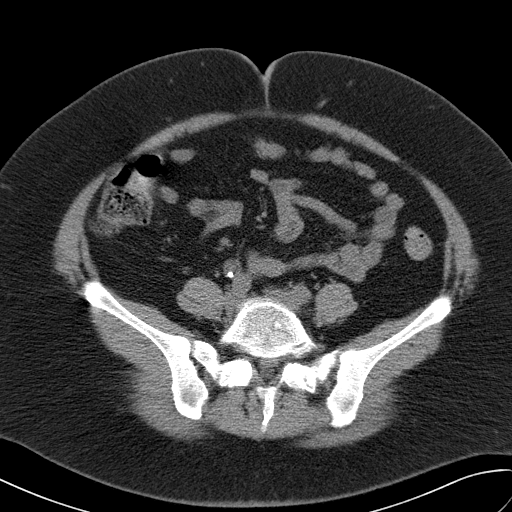
[im 44/99  soft-tissue]
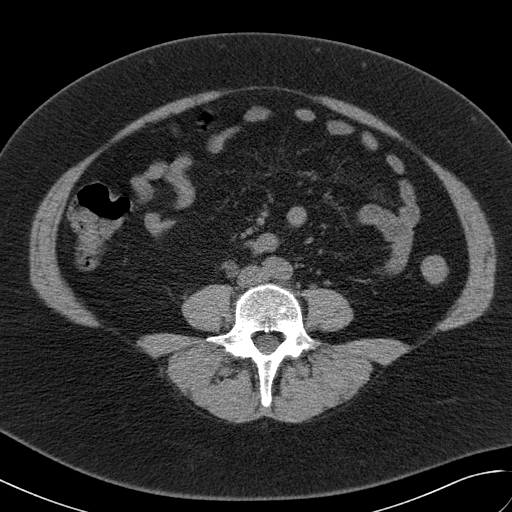
[im 51/99  soft-tissue]
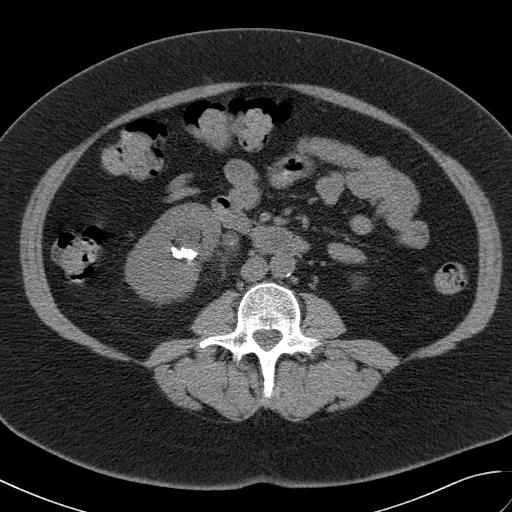
[im 55/99  soft-tissue]
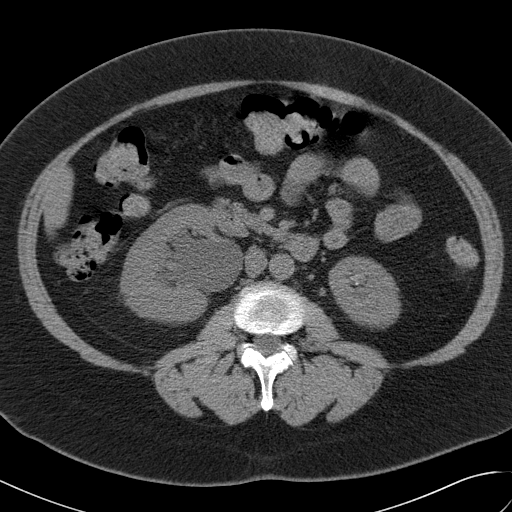
[im 63/99  soft-tissue]
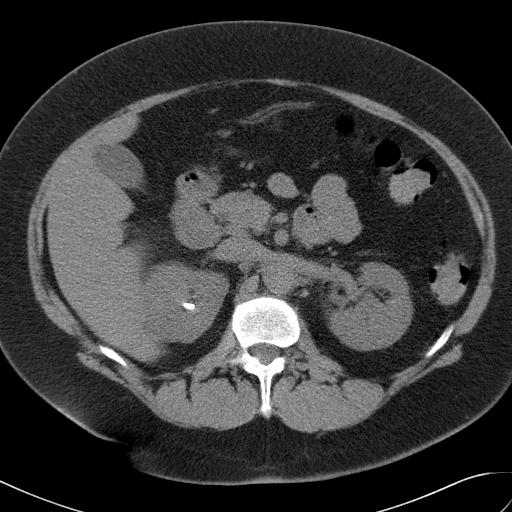
[im 63/99  bone]
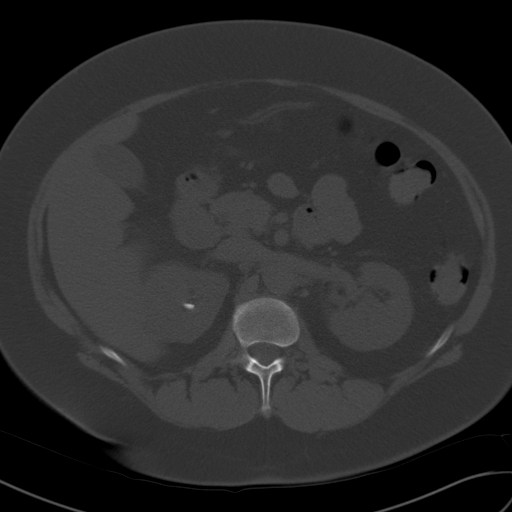
[im 71/99  soft-tissue]
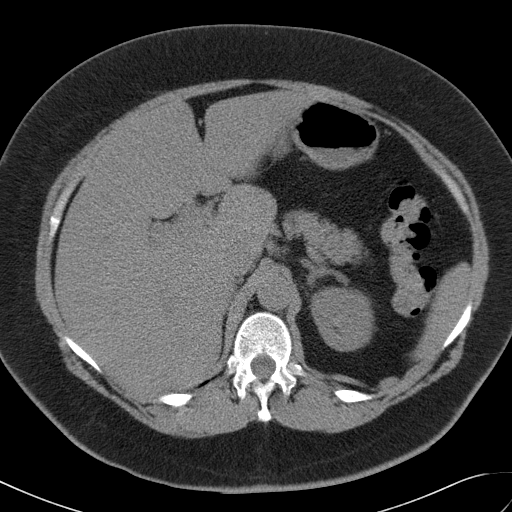
[im 79/99  soft-tissue]
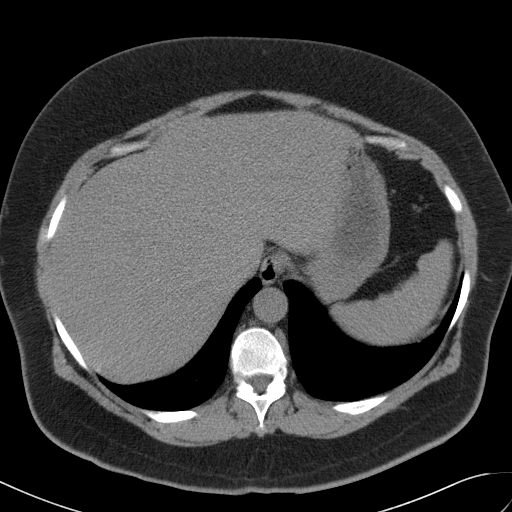
[im 83/99  lung]
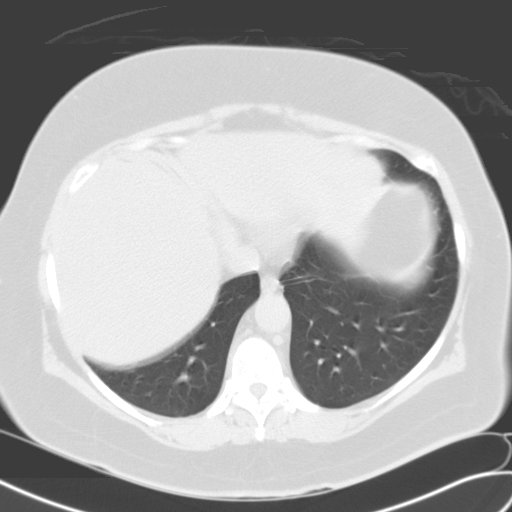
[im 87/99  soft-tissue]
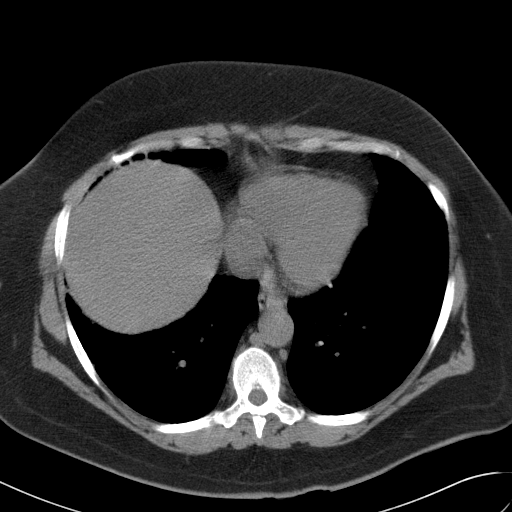
[im 87/99  lung]
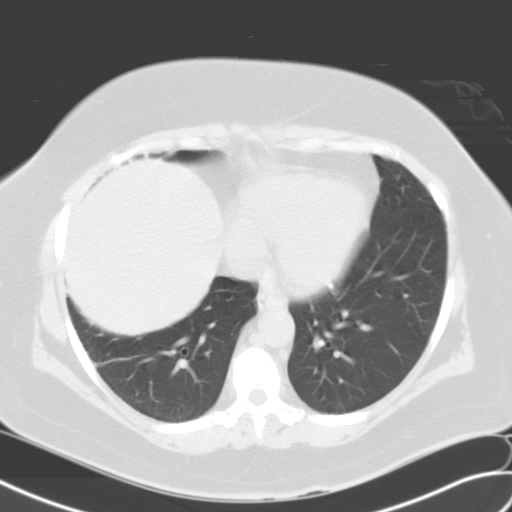
[im 91/99  lung]
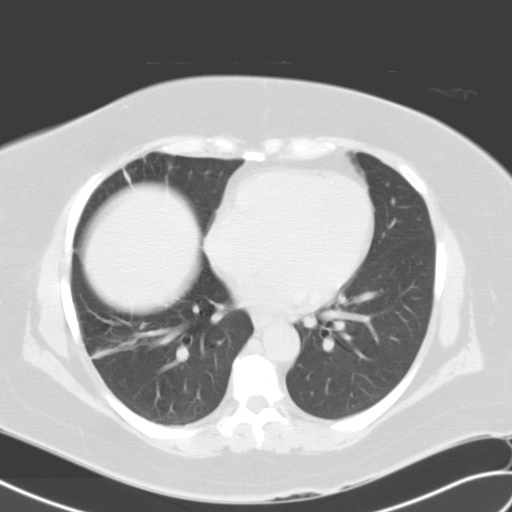
[im 95/99  soft-tissue]
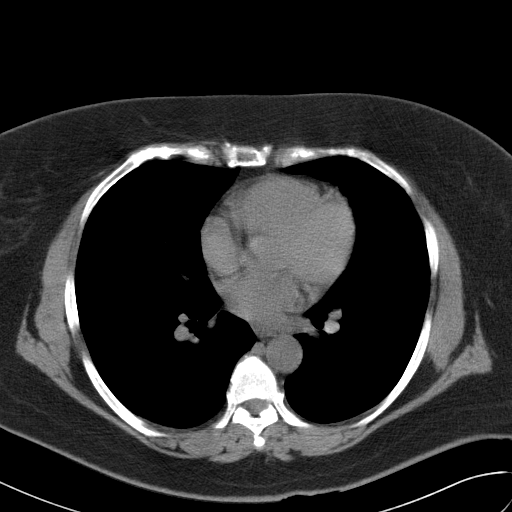
[im 95/99  lung]
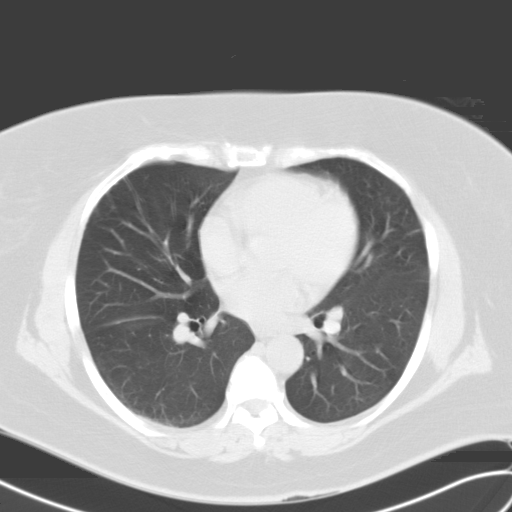

[15 of 32 positions shown; findings below may reference images not displayed]

FINDINGS: The liver, gallbladder, pancreas, and adrenal glands are unremarkable.  New moderate right hydronephrosis caused by a long segment of urinary calculi/fragments in the mid/distal right ureter with a transverse diameter of 6 mm and traversing a length of 6 cm.  This calculus has migrated from the renal pelvis following lithotripsy.  Also noted are bilateral nonobstructing renal calculi.  A stable 13 x 9 mm calculus in the left renal pelvis is noted with new very mild left hydronephrosis.  No evidence of free fluid, enlarged lymph nodes. abdominal aortic aneurysm, or biliary dilatation.  Mild right basilar atelectasis/scarring is identified.
IMPRESSION: 1.  New moderate right hydronephrosis caused by a 6 cm long collection of urinary calculi/fragments in the mid/distal right ureter (Steinstrasse).
2.  Stable 13 x 9 mm calculus in the left renal pelvis with slight increase in left intrarenal collecting system fullness.
3.  Multiple bilateral renal calculi.
CT PELVIS WITHOUT CONTRAST:
FINDINGS: A 6 cm long collection of urinary calculi/fragments within the mid/distal right ureter is identified causing moderate right hydronephrosis.  There is no evidence of a left ureteral calculus.  No free fluid or enlarged lymph nodes.  The patient is status post hysterectomy.  Visualized bowel and appendix are normal.
IMPRESSION: 6 cm long collection of calculi/fragments within the mid/distal right ureter (Steinstrasse) causing moderate right hydronephrosis.

## 2007-03-25 IMAGING — RF DG RETROGRADE PYELOGRAM
1 series · 5 of 5 positions shown · non-contrast
Comparison: none

CLINICAL DATA: 59-year-old with right ureteral calculus. 
 RETROGRADE URETERAL PYELOGRAM:
 There are multiple fluoroscopic spot images from a retrograde procedure.  There is a small amount of contrast in the low ureter which demonstrates multiple filling defects, likely multiple stones.  This possibly could be air bubbles.  There is subsequent placement of a stent in the right ureter.

[Series 1110: run · 5 of 5 slices shown]
[im 1/5]
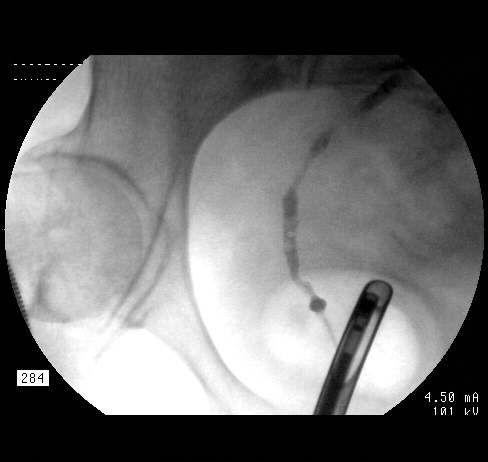
[im 2/5]
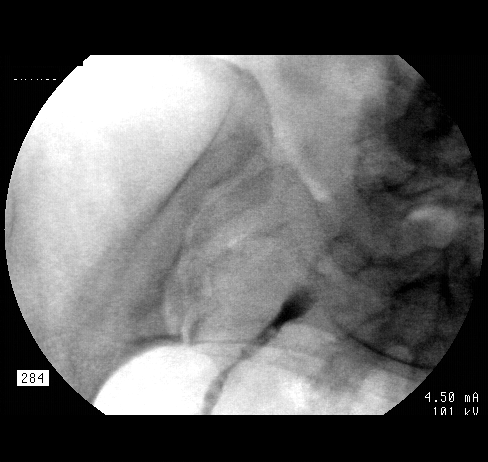
[im 3/5]
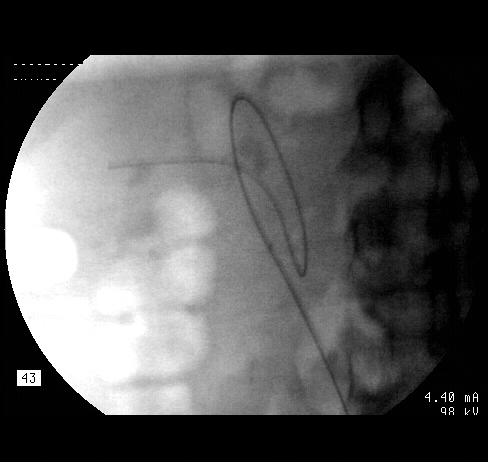
[im 4/5]
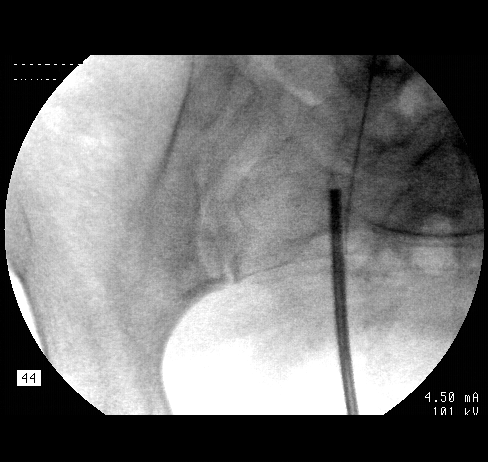
[im 5/5]
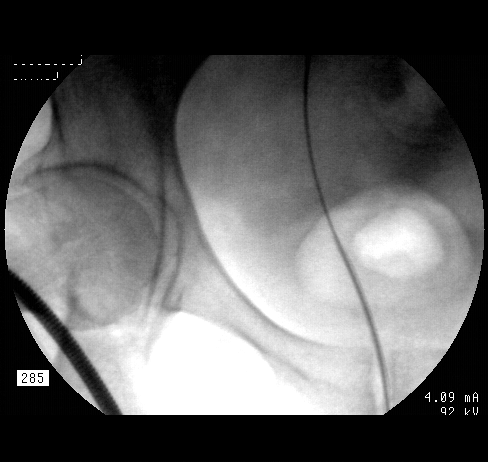

[5 of 5 positions shown; findings below may reference images not displayed]

IMPRESSION: Multiple filling defects in the lower ureter, likely representing ureteral calculi.  Subsequent placement of a ureteral stent.

## 2007-04-11 ENCOUNTER — Encounter: Payer: Self-pay | Admitting: Family Medicine

## 2007-04-11 LAB — CONVERTED CEMR LAB
BUN: 13 mg/dL (ref 6–23)
CO2: 25 meq/L (ref 19–32)
Calcium: 9.8 mg/dL (ref 8.4–10.5)
Chloride: 101 meq/L (ref 96–112)
Cholesterol: 143 mg/dL (ref 0–200)
Creatinine, Ser: 0.76 mg/dL (ref 0.40–1.20)
Glucose, Bld: 101 mg/dL — ABNORMAL HIGH (ref 70–99)
HDL: 38 mg/dL — ABNORMAL LOW (ref >39)
LDL Cholesterol: 76 mg/dL (ref 0–99)
Potassium: 3.6 meq/L (ref 3.5–5.3)
Sodium: 144 meq/L (ref 135–145)
TSH: 1.384 microintl units/mL (ref 0.350–5.50)
Total CHOL/HDL Ratio: 3.8
Triglycerides: 146 mg/dL (ref <150)
VLDL: 29 mg/dL (ref 0–40)

## 2007-04-13 IMAGING — CR DG ABDOMEN 1V
2 series · 2 of 2 positions shown · non-contrast
Comparison: none

HISTORY: Ureteral calcification

ABDOMEN ONE VIEW:
Comparison 10/13/2004
Right ureteral stent.
Bilateral renal calculi.
No definite calculi along course of right ureteral stent.
Small calculus noted at expected position of right ureteropelvic junction,
approximately 8mm diameter.

[view not recorded (1 of 2)]
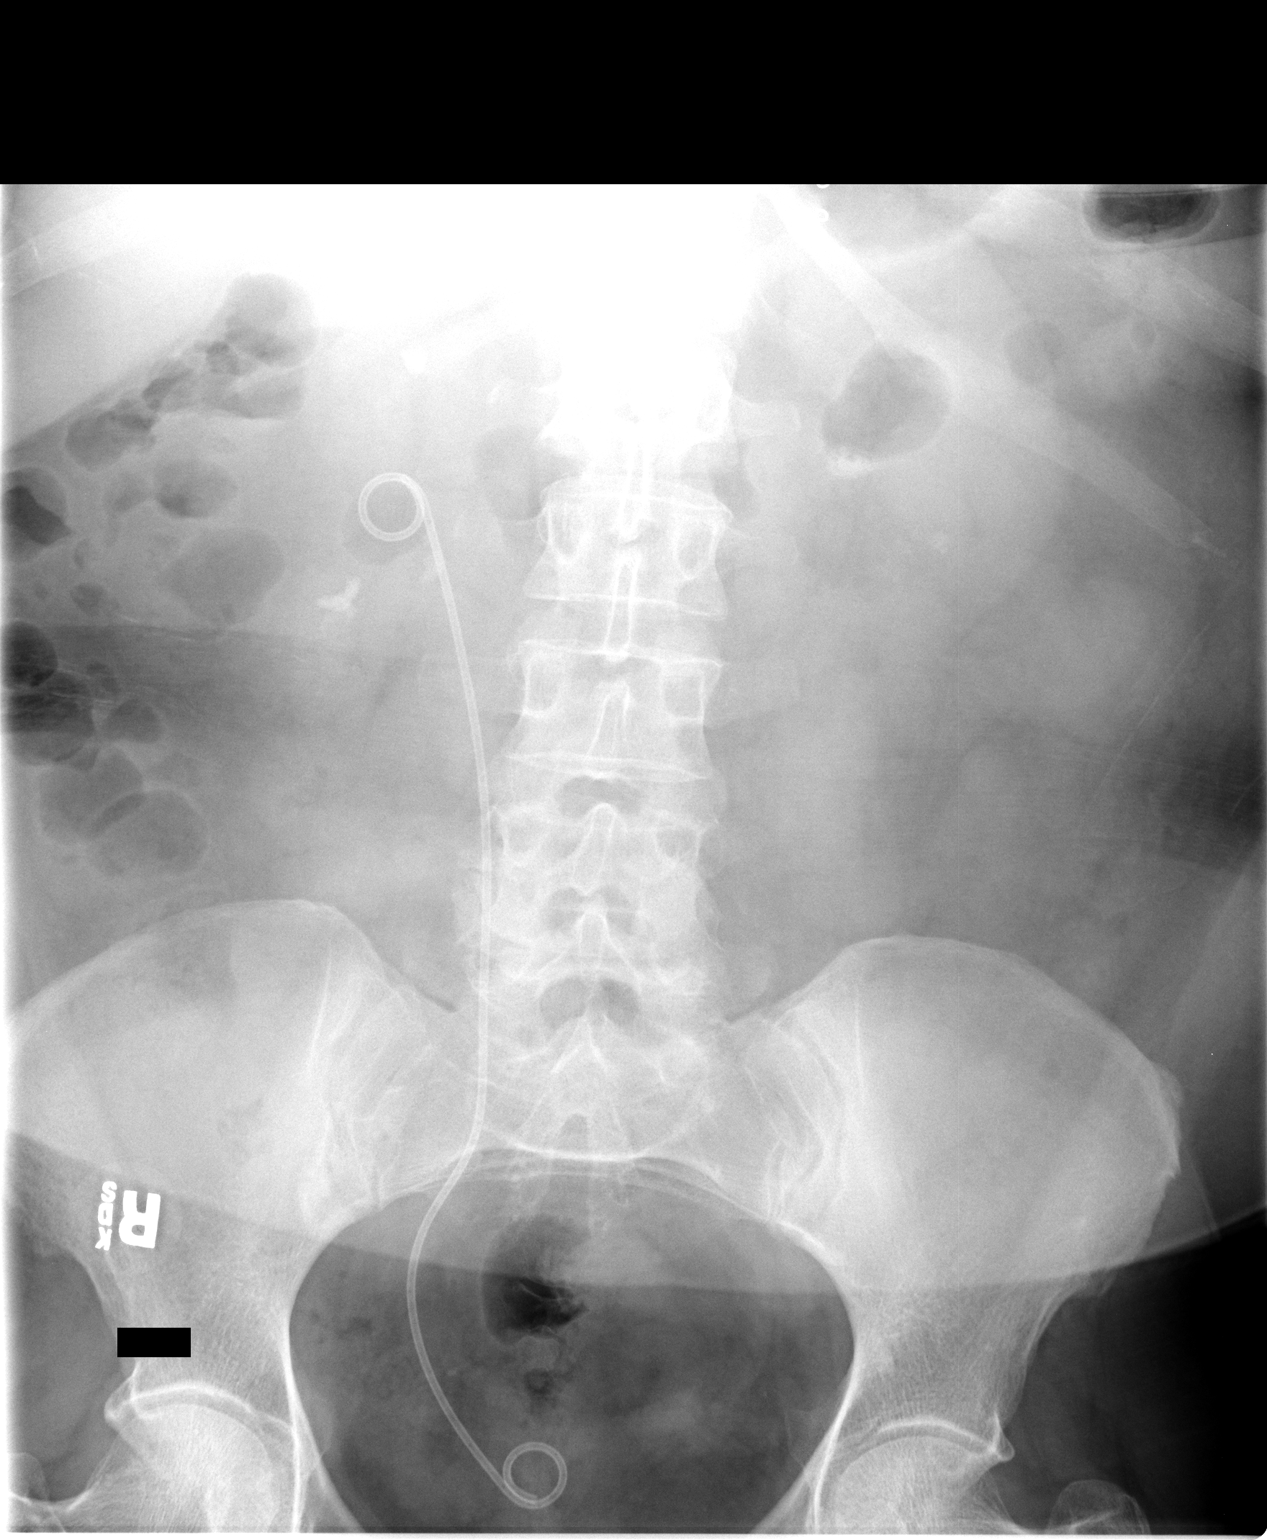

[view not recorded (2 of 2)]
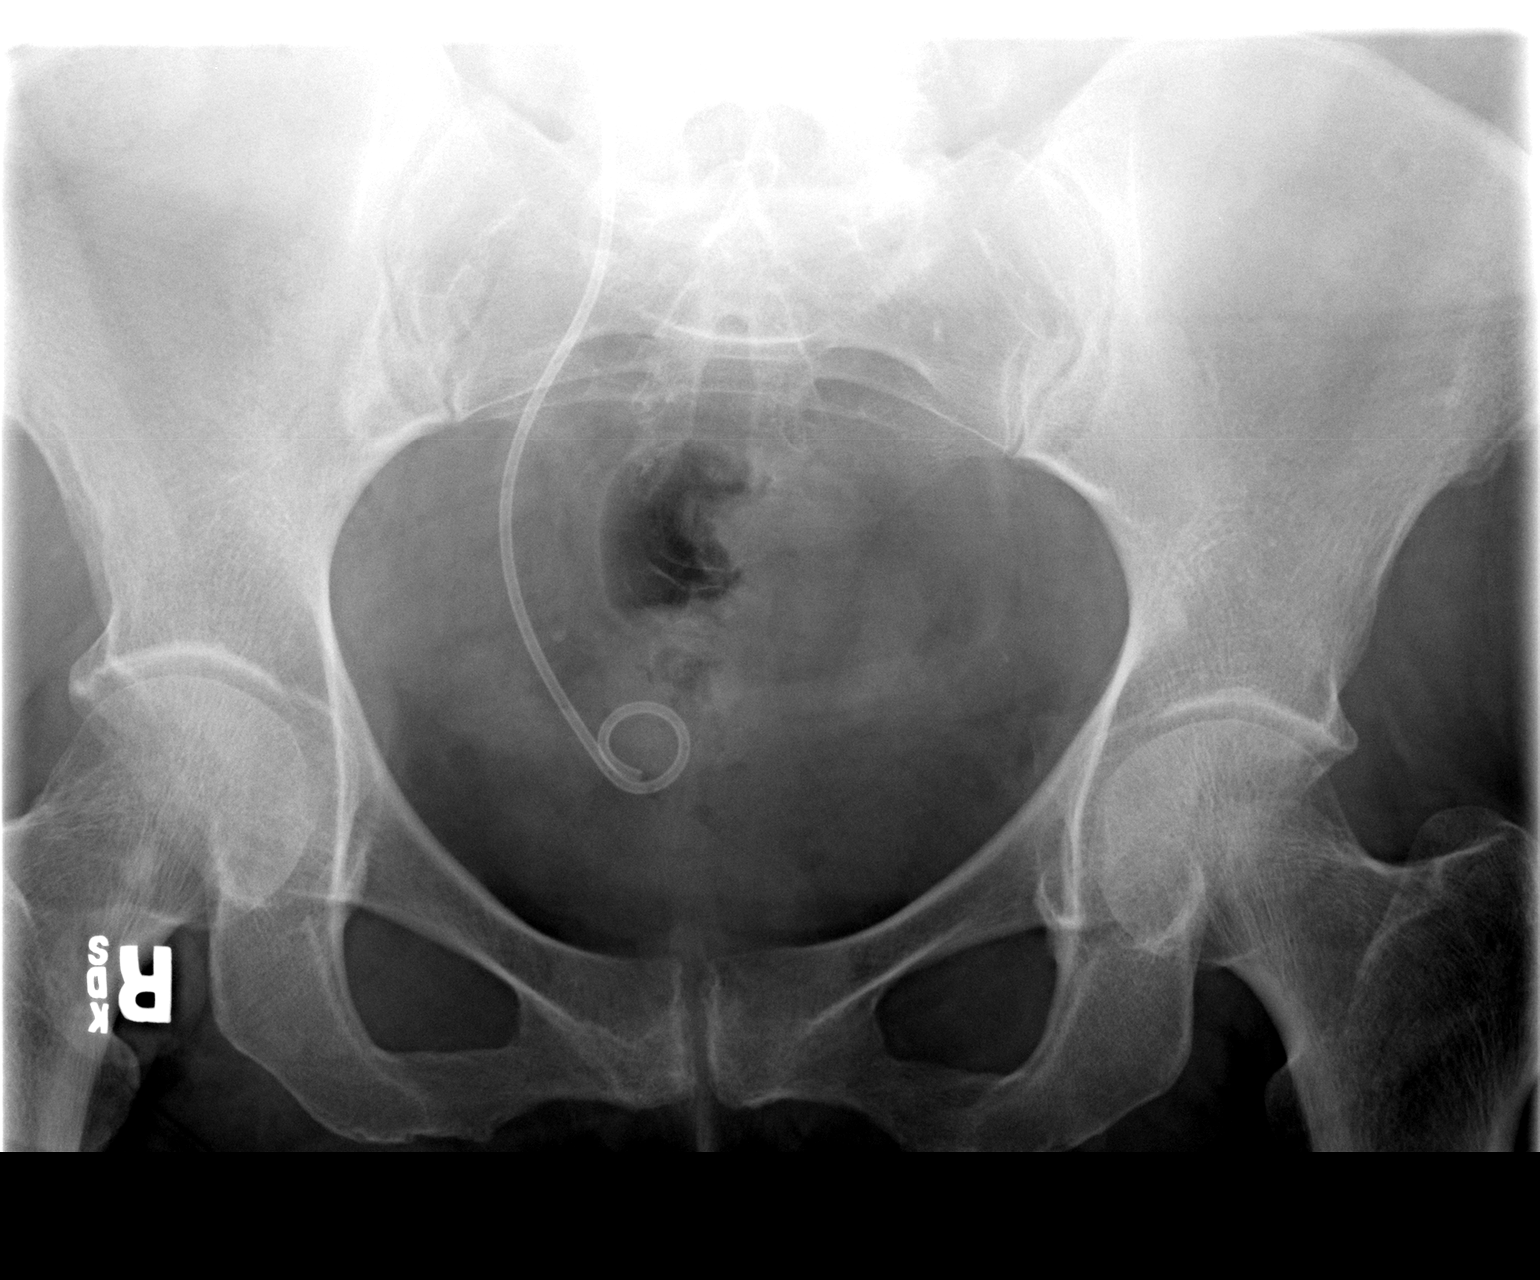

[2 of 2 positions shown; findings below may reference images not displayed]

IMPRESSION: Bilateral renal calculi, including an 8mm diameter calculus at expected position
of right ureteropelvic junction.

## 2007-04-15 ENCOUNTER — Ambulatory Visit: Payer: Self-pay | Admitting: Family Medicine

## 2007-04-24 ENCOUNTER — Encounter (HOSPITAL_COMMUNITY): Admission: RE | Admit: 2007-04-24 | Discharge: 2007-05-24 | Payer: Self-pay | Admitting: Family Medicine

## 2007-05-27 ENCOUNTER — Encounter (HOSPITAL_COMMUNITY): Admission: RE | Admit: 2007-05-27 | Discharge: 2007-06-11 | Payer: Self-pay | Admitting: Family Medicine

## 2007-06-12 ENCOUNTER — Encounter: Payer: Self-pay | Admitting: Family Medicine

## 2007-07-01 IMAGING — CR DG ABDOMEN 1V
2 series · 2 of 2 positions shown · non-contrast
Comparison: 11/01/04.

CLINICAL DATA: Status post ESL.
ABDOMEN ? 1 VIEW:

[view not recorded (1 of 2)]
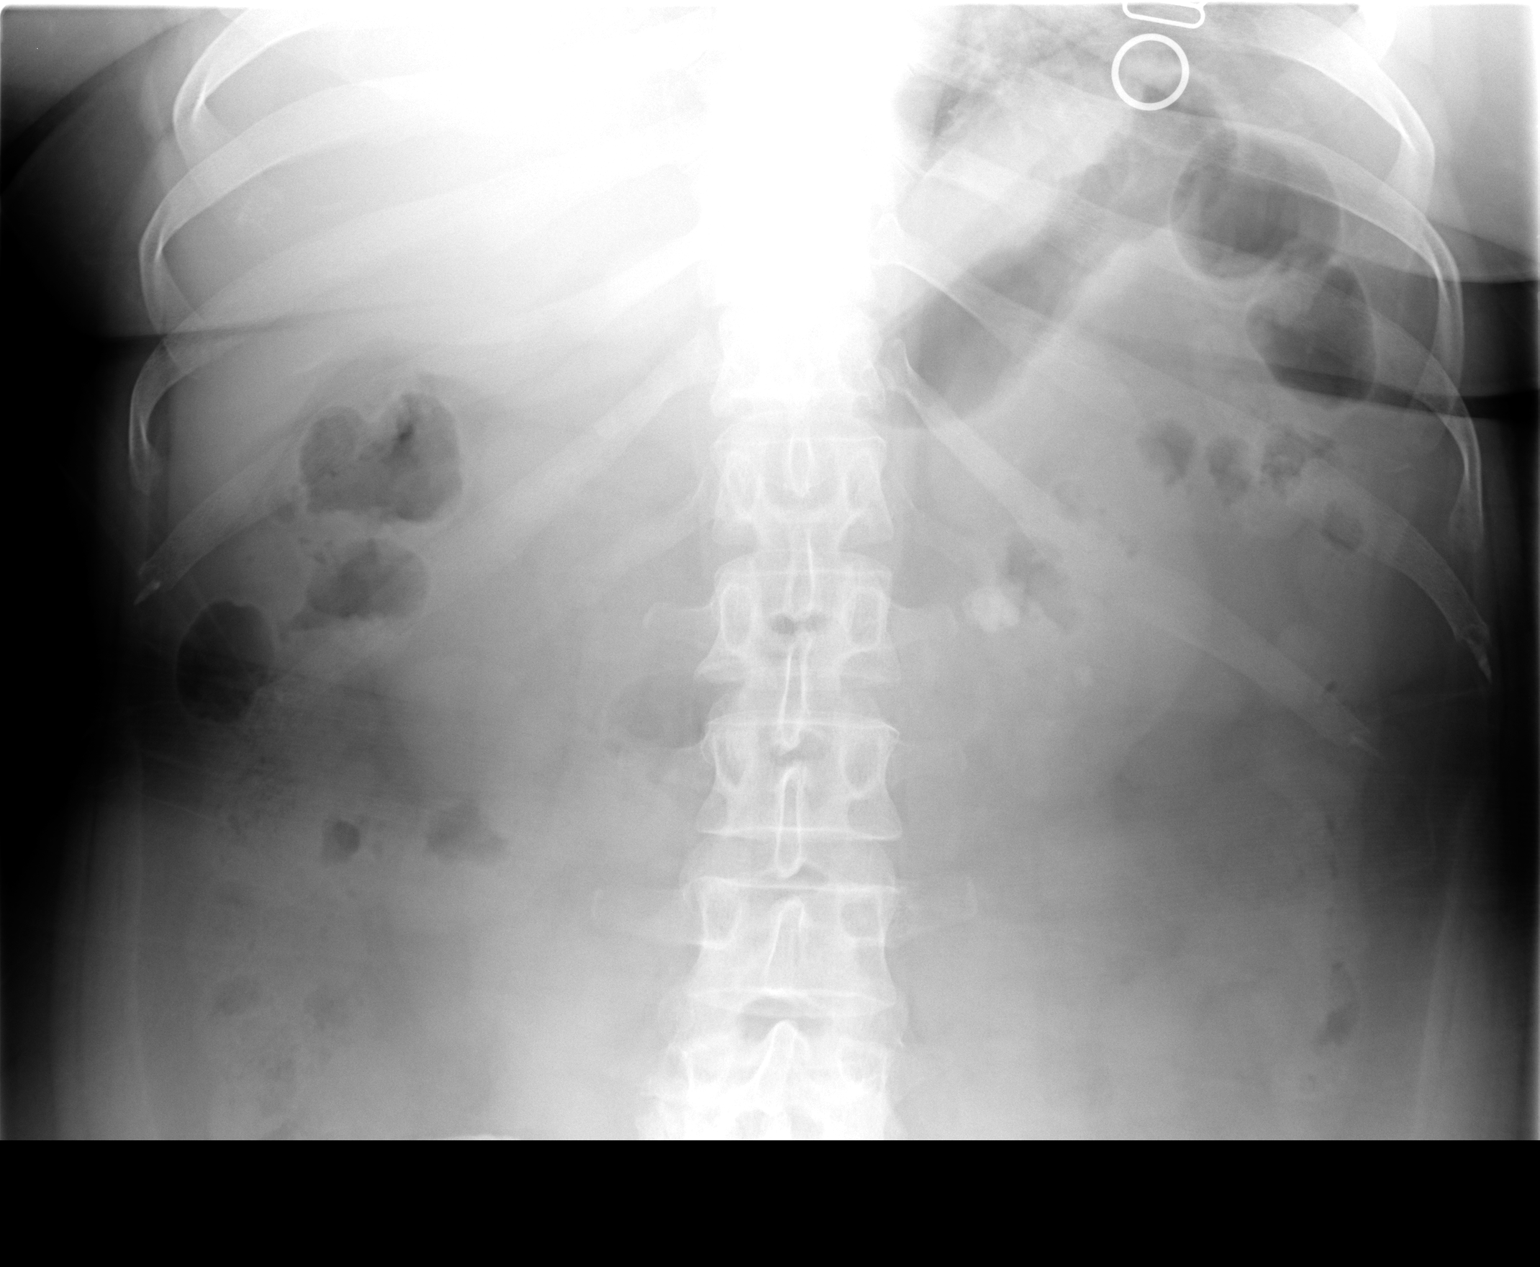

[view not recorded (2 of 2)]
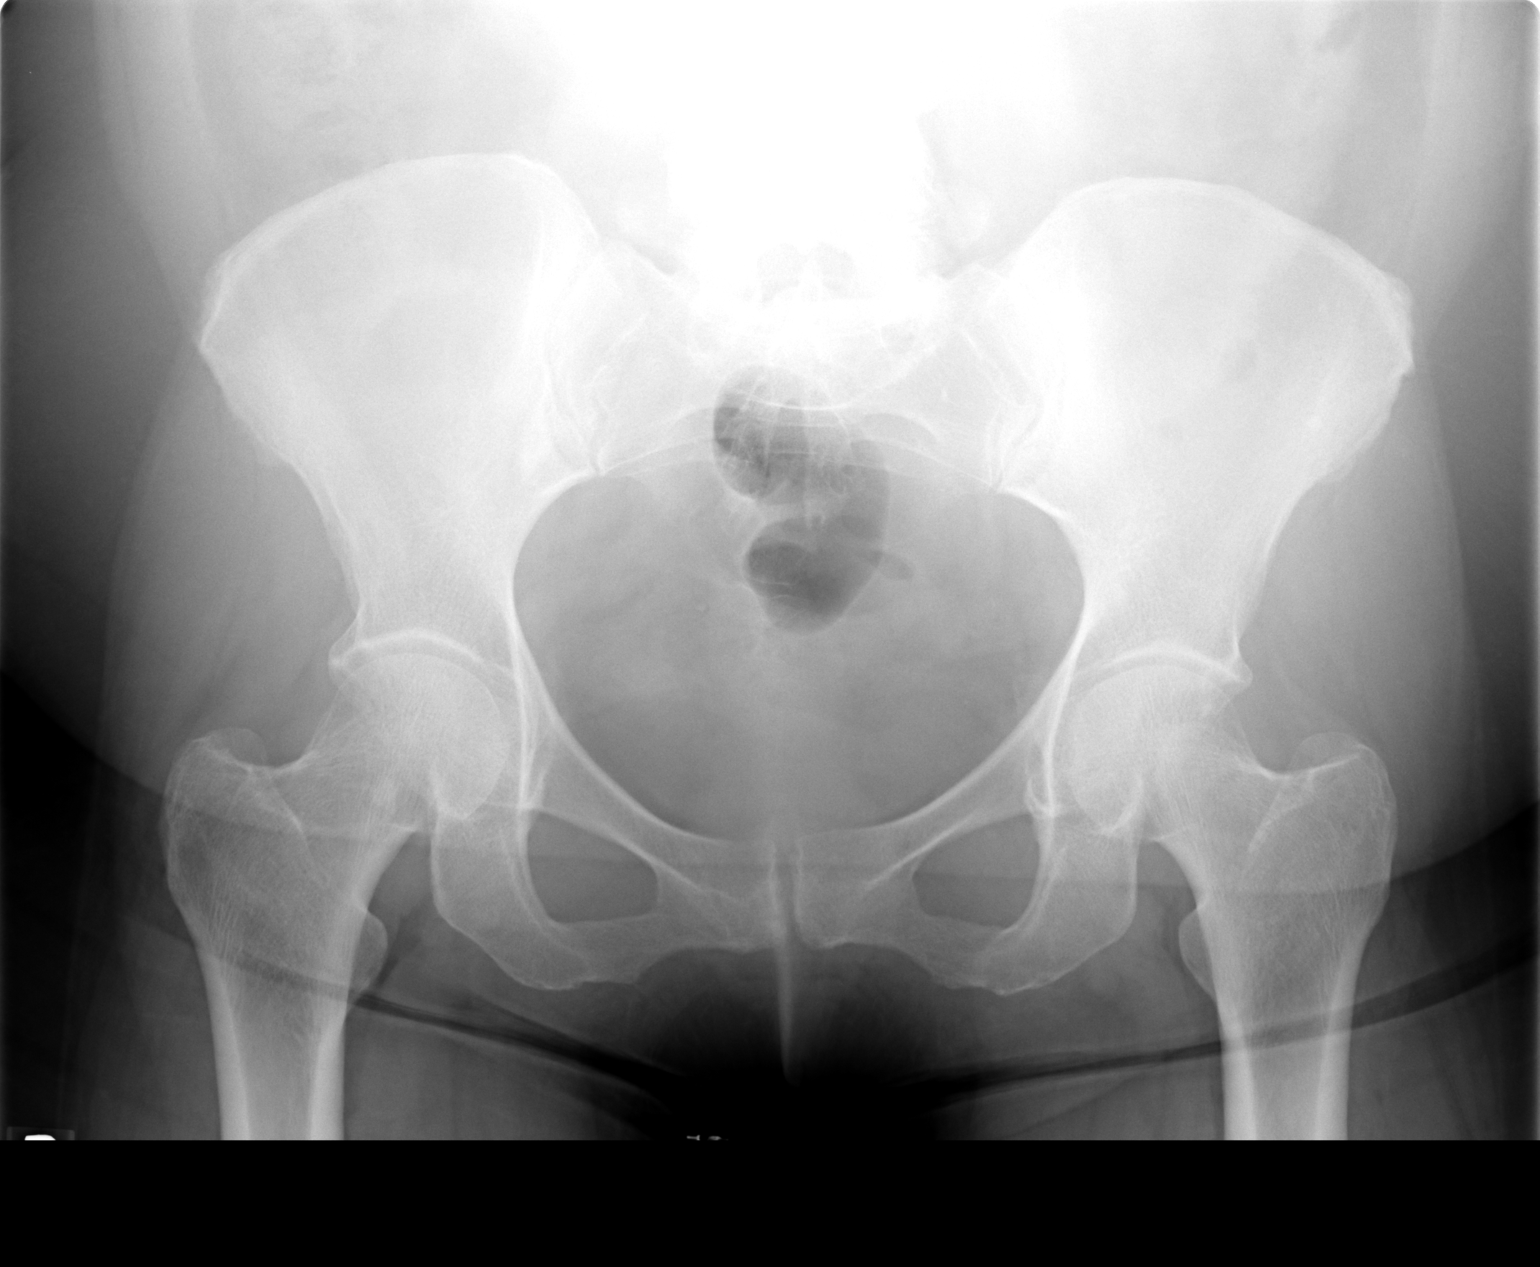

[2 of 2 positions shown; findings below may reference images not displayed]

FINDINGS: Interval removal of right-sided Double-J ureteral stent. There is a density in the projection of the twelfth rib on the right.  This is suspicious for a single renal stone.  This stone measures 7 mm.   On the left, there is a very large renal stone within the lower pole collecting system measuring 14.8 mm.  An additional lower pole collecting system stone measures 6.2 mm.  These are unchanged in position when compared to the previous examination.
IMPRESSION: Interval removal of the Double-J stent.

## 2007-07-25 ENCOUNTER — Encounter: Payer: Self-pay | Admitting: Family Medicine

## 2007-07-25 LAB — CONVERTED CEMR LAB
ALT: 17 units/L (ref 0–35)
AST: 15 units/L (ref 0–37)
Albumin: 4.4 g/dL (ref 3.5–5.2)
Alkaline Phosphatase: 93 units/L (ref 39–117)
BUN: 10 mg/dL (ref 6–23)
Bilirubin, Direct: 0.1 mg/dL (ref 0.0–0.3)
CO2: 25 meq/L (ref 19–32)
Calcium: 9.7 mg/dL (ref 8.4–10.5)
Chloride: 105 meq/L (ref 96–112)
Cholesterol: 157 mg/dL (ref 0–200)
Creatinine, Ser: 0.83 mg/dL (ref 0.40–1.20)
Glucose, Bld: 88 mg/dL (ref 70–99)
HDL: 44 mg/dL (ref >39)
Indirect Bilirubin: 0.2 mg/dL (ref 0.0–0.9)
LDL Cholesterol: 87 mg/dL (ref 0–99)
Potassium: 4.3 meq/L (ref 3.5–5.3)
Sodium: 143 meq/L (ref 135–145)
Total Bilirubin: 0.3 mg/dL (ref 0.3–1.2)
Total CHOL/HDL Ratio: 3.6
Total Protein: 7.8 g/dL (ref 6.0–8.3)
Triglycerides: 128 mg/dL (ref <150)
VLDL: 26 mg/dL (ref 0–40)

## 2007-07-31 ENCOUNTER — Ambulatory Visit: Payer: Self-pay | Admitting: Family Medicine

## 2007-08-05 ENCOUNTER — Ambulatory Visit: Payer: Self-pay | Admitting: Gastroenterology

## 2007-09-09 ENCOUNTER — Ambulatory Visit: Payer: Self-pay | Admitting: Family Medicine

## 2007-09-17 ENCOUNTER — Encounter (INDEPENDENT_AMBULATORY_CARE_PROVIDER_SITE_OTHER): Payer: Self-pay | Admitting: *Deleted

## 2007-09-17 DIAGNOSIS — I10 Essential (primary) hypertension: Secondary | ICD-10-CM | POA: Insufficient documentation

## 2007-09-17 DIAGNOSIS — E785 Hyperlipidemia, unspecified: Secondary | ICD-10-CM | POA: Insufficient documentation

## 2007-09-17 DIAGNOSIS — F411 Generalized anxiety disorder: Secondary | ICD-10-CM | POA: Insufficient documentation

## 2007-09-17 DIAGNOSIS — M549 Dorsalgia, unspecified: Secondary | ICD-10-CM | POA: Insufficient documentation

## 2007-10-10 ENCOUNTER — Ambulatory Visit: Payer: Self-pay | Admitting: Family Medicine

## 2007-10-11 ENCOUNTER — Encounter: Payer: Self-pay | Admitting: Family Medicine

## 2007-12-15 ENCOUNTER — Emergency Department (HOSPITAL_COMMUNITY): Admission: EM | Admit: 2007-12-15 | Discharge: 2007-12-15 | Payer: Self-pay | Admitting: Emergency Medicine

## 2007-12-17 ENCOUNTER — Ambulatory Visit: Payer: Self-pay | Admitting: Family Medicine

## 2008-01-06 ENCOUNTER — Emergency Department (HOSPITAL_COMMUNITY): Admission: EM | Admit: 2008-01-06 | Discharge: 2008-01-06 | Payer: Self-pay | Admitting: Emergency Medicine

## 2008-01-07 ENCOUNTER — Ambulatory Visit: Payer: Self-pay | Admitting: Family Medicine

## 2008-01-07 ENCOUNTER — Encounter: Payer: Self-pay | Admitting: Family Medicine

## 2008-01-07 LAB — CONVERTED CEMR LAB: Glucose, Bld: 162 mg/dL

## 2008-01-08 ENCOUNTER — Telehealth: Payer: Self-pay | Admitting: Family Medicine

## 2008-01-09 ENCOUNTER — Telehealth: Payer: Self-pay | Admitting: Family Medicine

## 2008-01-21 ENCOUNTER — Ambulatory Visit (HOSPITAL_COMMUNITY): Admission: RE | Admit: 2008-01-21 | Discharge: 2008-01-21 | Payer: Self-pay | Admitting: Family Medicine

## 2008-01-22 ENCOUNTER — Telehealth: Payer: Self-pay | Admitting: Family Medicine

## 2008-01-26 ENCOUNTER — Ambulatory Visit (HOSPITAL_COMMUNITY): Admission: RE | Admit: 2008-01-26 | Discharge: 2008-01-26 | Payer: Self-pay | Admitting: Family Medicine

## 2008-01-26 ENCOUNTER — Encounter: Payer: Self-pay | Admitting: Family Medicine

## 2008-01-26 LAB — CONVERTED CEMR LAB
Bilirubin Urine: NEGATIVE
Blood in Urine, dipstick: NEGATIVE
Glucose, Urine, Semiquant: NEGATIVE
Ketones, urine, test strip: NEGATIVE
Nitrite: NEGATIVE
Protein, U semiquant: NEGATIVE
Specific Gravity, Urine: 1.02
Urobilinogen, UA: 0.2
WBC Urine, dipstick: NEGATIVE
pH: 5.5

## 2008-01-27 ENCOUNTER — Encounter: Payer: Self-pay | Admitting: Family Medicine

## 2008-01-30 ENCOUNTER — Encounter: Payer: Self-pay | Admitting: Family Medicine

## 2008-02-02 ENCOUNTER — Ambulatory Visit: Payer: Self-pay | Admitting: Family Medicine

## 2008-02-02 DIAGNOSIS — E1121 Type 2 diabetes mellitus with diabetic nephropathy: Secondary | ICD-10-CM | POA: Insufficient documentation

## 2008-02-02 LAB — CONVERTED CEMR LAB
Glucose, Bld: 182 mg/dL
Hgb A1c MFr Bld: 6.5 %
OCCULT 1: NEGATIVE

## 2008-02-09 IMAGING — MR MR [PERSON_NAME] UP JT W/O CM*L*
5 series · 40 of 40 positions shown · IV contrast (agent unspecified)
Comparison: none

CLINICAL DATA: Bilateral shoulder pain superiorly.  Limited overhead motion.  
 MRI OF THE RIGHT SHOULDER WITHOUT CONTRAST:
TECHNIQUE: Multiplanar, multisequence MR imaging of the shoulder was performed following the standard protocol.  No intravenous contrast was administered.

[Series 4: pdfs axial · axial · 4.0mm · 0.44mm/px · z∈[-39,+39]mm · 9 of 19 slices shown]
[im 1/19]
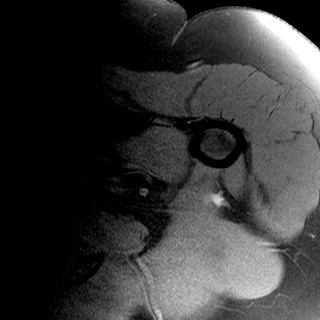
[im 3/19]
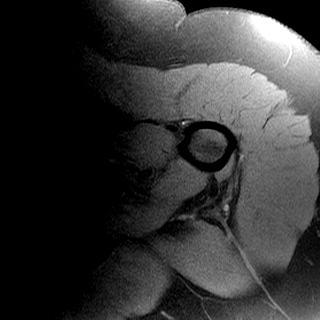
[im 5/19]
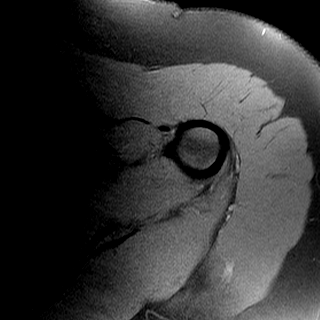
[im 7/19]
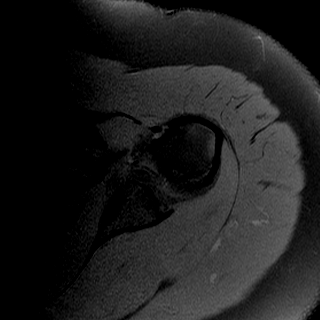
[im 10/19]
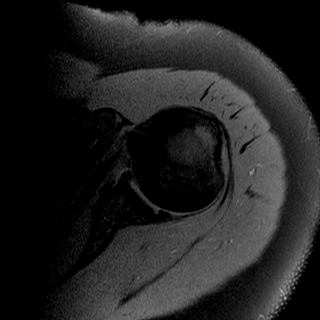
[im 12/19]
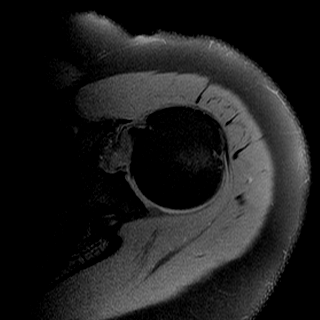
[im 14/19]
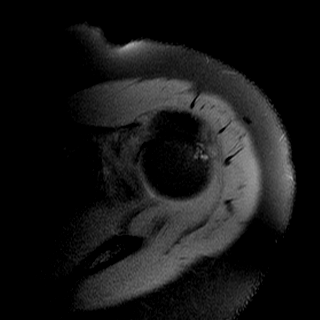
[im 16/19]
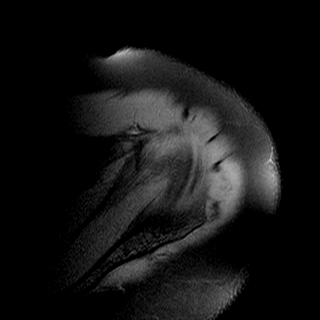
[im 19/19]
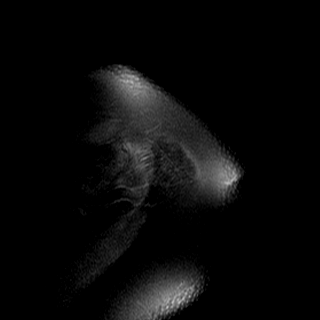

[Series 5: T1 · oblique · 4.0mm · 0.50mm/px · 7 of 15 slices shown]
[im 1/15]
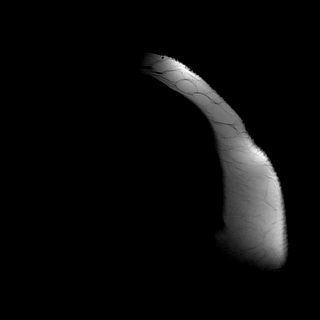
[im 3/15]
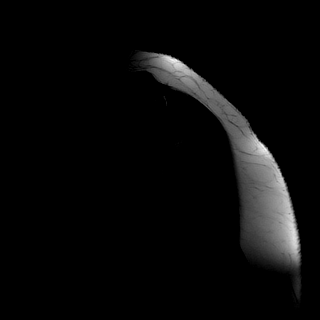
[im 5/15]
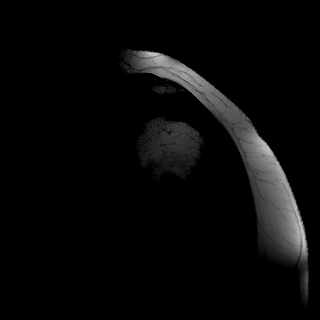
[im 8/15]
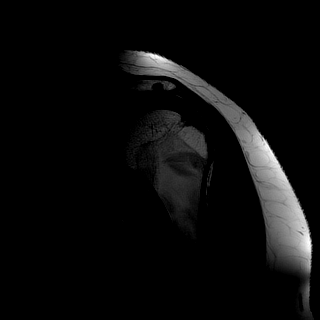
[im 10/15]
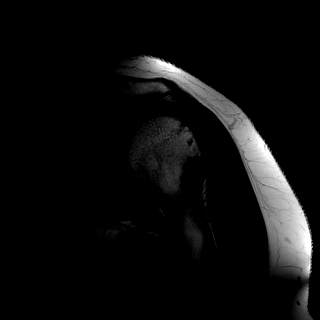
[im 12/15]
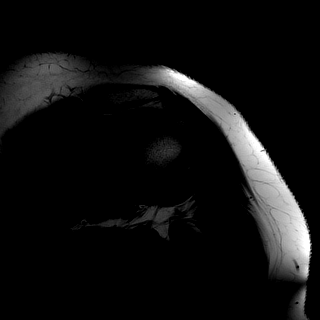
[im 15/15]
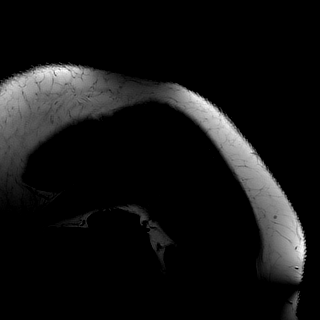

[Series 7: t2fs coronal · oblique · 4.0mm · 0.50mm/px · 8 of 15 slices shown]
[im 1/15]
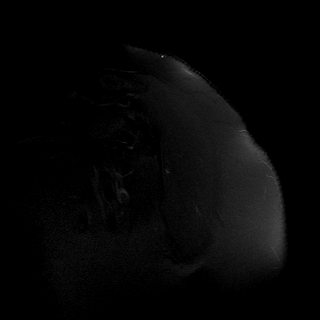
[im 3/15]
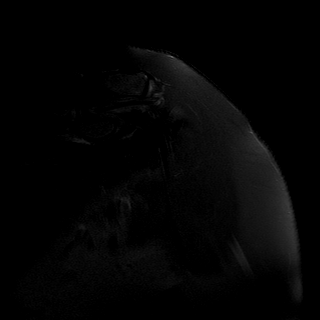
[im 5/15]
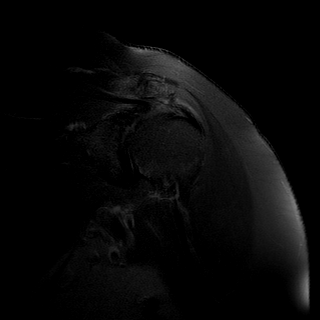
[im 7/15]
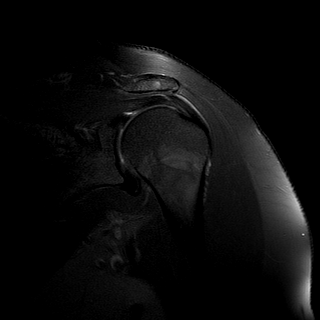
[im 9/15]
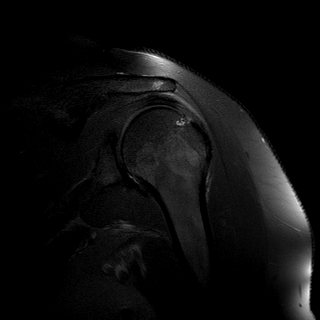
[im 11/15]
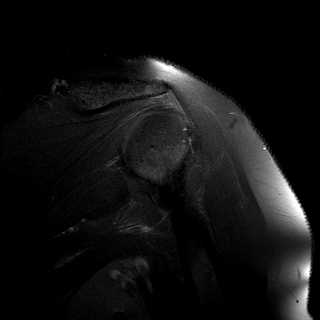
[im 13/15]
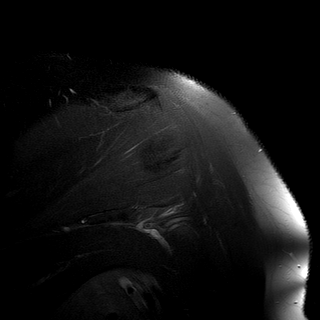
[im 15/15]
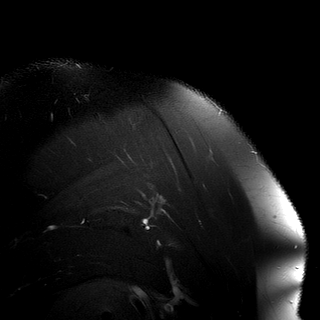

[Series 8: pdfs coronal · oblique · 4.0mm · 0.50mm/px · 8 of 15 slices shown]
[im 1/15]
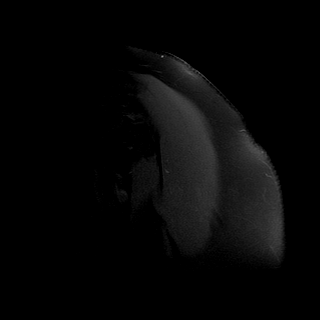
[im 3/15]
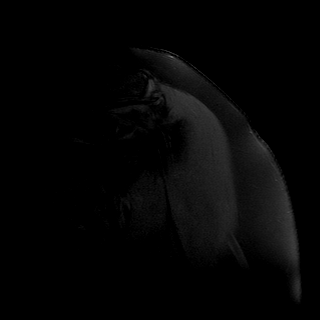
[im 5/15]
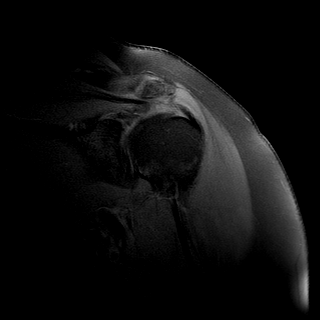
[im 7/15]
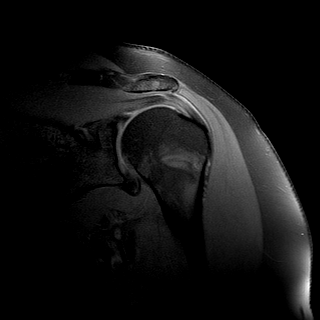
[im 9/15]
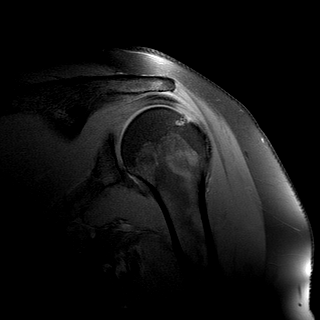
[im 11/15]
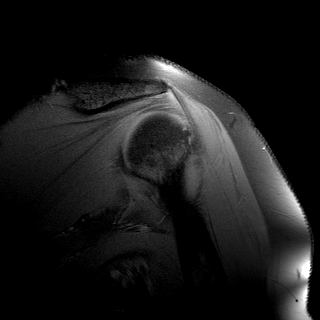
[im 13/15]
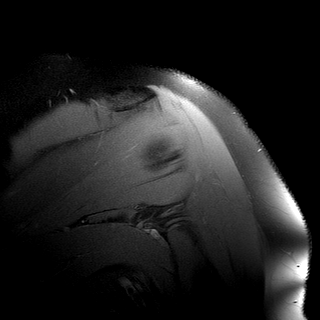
[im 15/15]
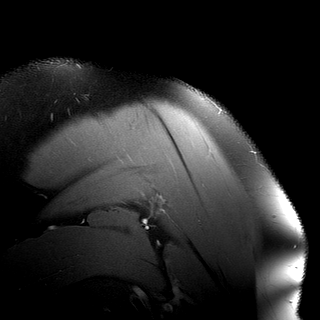

[Series 9: t2fs sagital · oblique · 4.0mm · 0.50mm/px · 8 of 15 slices shown]
[im 1/15]
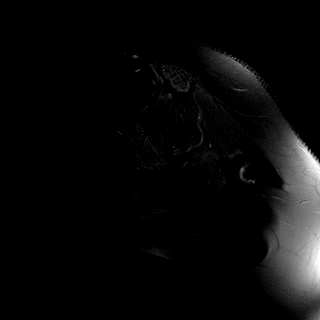
[im 3/15]
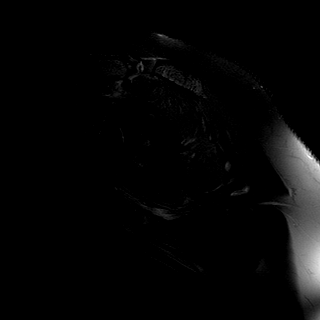
[im 5/15]
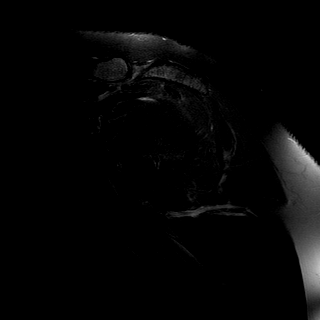
[im 7/15]
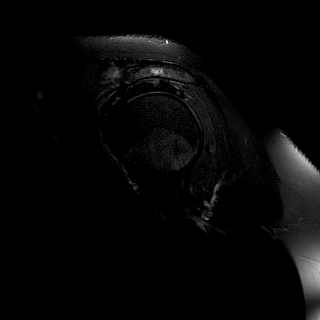
[im 9/15]
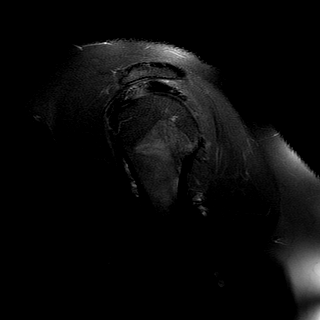
[im 11/15]
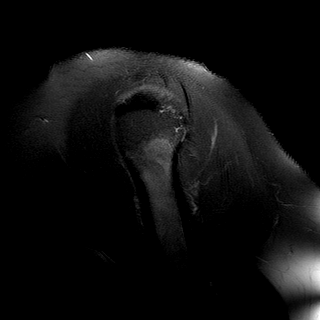
[im 13/15]
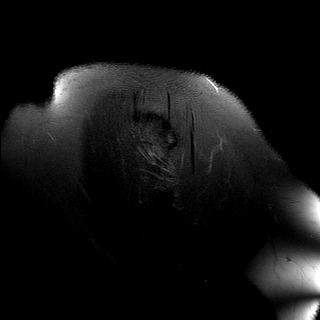
[im 15/15]
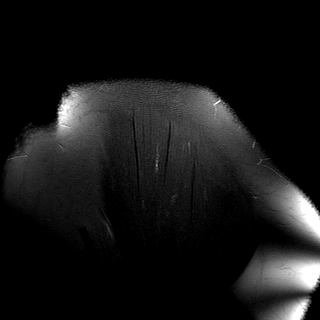

[40 of 40 positions shown; findings below may reference images not displayed]

FINDINGS: There is a severely diseased supraspinatus tendon.  There is a full thickness tear with a gap of about 1.3 cm.  Some intact fibers do persist and the tendon is not completely retracted. There is a partial tear of the distal infraspinatus tendon at the attachment. Subscapularis and teres minor components appear normal.  There is a small amount of fluid in the subacromial and subdeltoid bursa and in the joint itself.  The acromial anatomy is not unfavorable.  The AC joint does not grossly impinge upon the cuff.  No significant glenohumeral joint pathology is seen. There may be some mild labral degeneration.  The biceps tendon is intact.
IMPRESSION: 1.  Full thickness tear of the supraspinatus tendon with a hole of about 1.3 cm in size.  Some intact fibers do persist and the tendon is not completely retracted.
 2.  Partial thickness tear of the infraspinatus insertion.
 MRI OF THE LEFT SHOULDER WITHOUT CONTRAST:
FINDINGS: The patient has similar findings on the left side when compared to the right.  There is a 1 to 1.5 cm region of tendinopathy of the supraspinatus tendon with full thickness tendon derangement.  I don?t see a distinct fluid-filled hole on this side.  Nonetheless, it would be possible for there to be a full thickness discontinuity, but there is certainly no retraction. There are changes of mild tendinopathy of the distal infraspinatus tendon with minimal partial thickness tearing. Subscapularis and teres minor components are normal.  There is a small amount of fluid in the subacromial subdeltoid bursa.  The acromion is not grossly unfavorable.  There is mild degenerative disease of the AC joint, but this does not appear to impinge upon the cuff.
IMPRESSION: 1.  Full thickness tendon pathology of the supraspinatus tendon without a definable fluid-filled hole or any retraction.  I am suspicious that there is a full thickness tear in this region that would allow some fluid to leak into the subacromial subdeltoid bursa but there is certainly no large fluid-filled hole or tendon retraction.
 2.  Minimal partial thickness tear of the infraspinatus tendon insertion region.

## 2008-02-09 IMAGING — MR MR [PERSON_NAME] UP JT W/O CM*R*
6 of 7 series · 34 of 40 positions shown · IV contrast (agent unspecified)
Comparison: none

CLINICAL DATA: Bilateral shoulder pain superiorly.  Limited overhead motion.  
 MRI OF THE RIGHT SHOULDER WITHOUT CONTRAST:
TECHNIQUE: Multiplanar, multisequence MR imaging of the shoulder was performed following the standard protocol.  No intravenous contrast was administered.

[Series 3: pdfs axial · axial · 4.0mm · 0.46mm/px · z∈[-19,+60]mm · 8 of 19 slices shown]
[im 1/19]
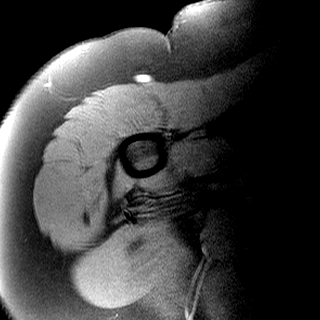
[im 3/19]
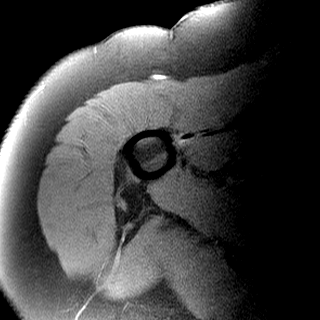
[im 6/19]
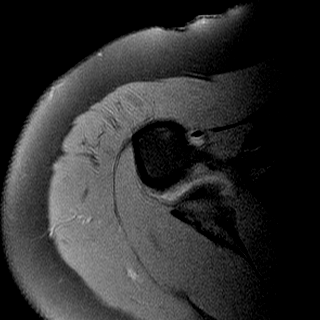
[im 8/19]
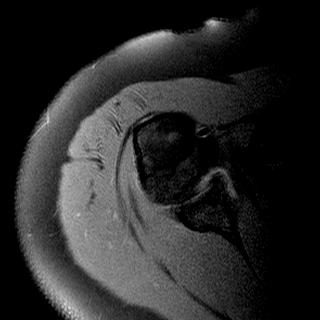
[im 11/19]
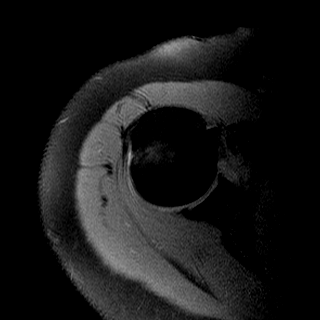
[im 13/19]
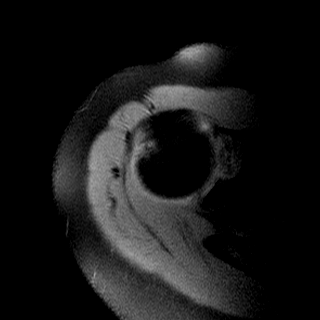
[im 16/19]
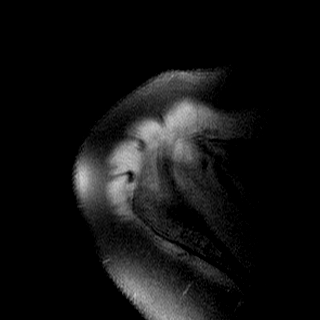
[im 19/19]
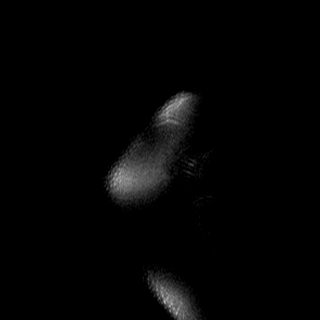

[Series 4: T1 · oblique · 4.0mm · 0.50mm/px · 5 of 15 slices shown]
[im 1/15]
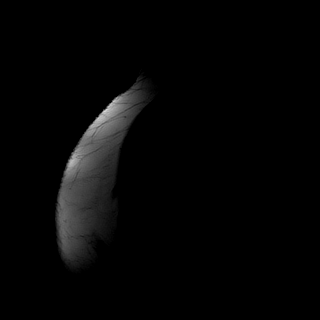
[im 4/15]
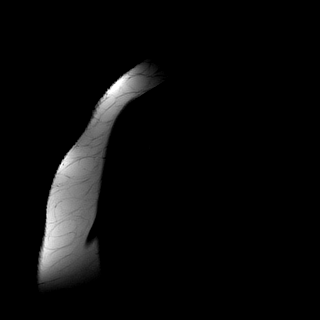
[im 8/15]
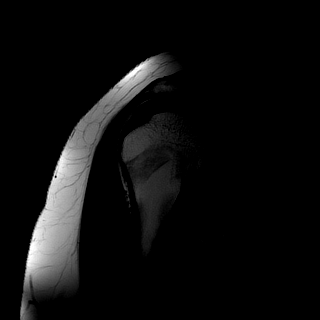
[im 11/15]
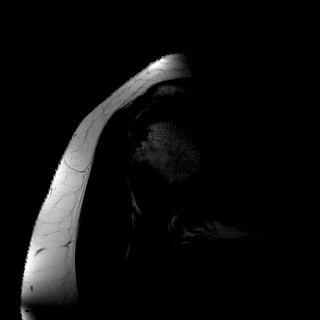
[im 15/15]
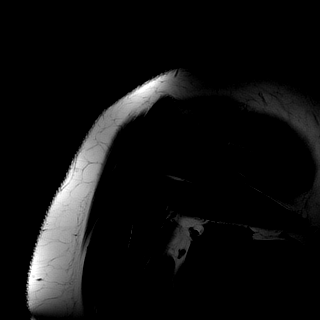

[Series 5: t2fs coronal · oblique · 4.0mm · 0.50mm/px · 5 of 15 slices shown]
[im 1/15]
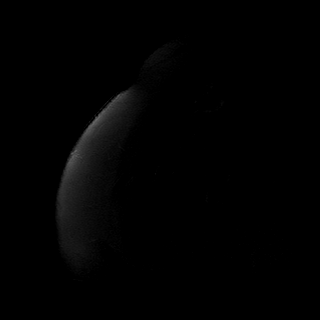
[im 4/15]
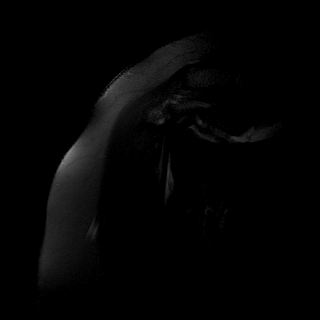
[im 8/15]
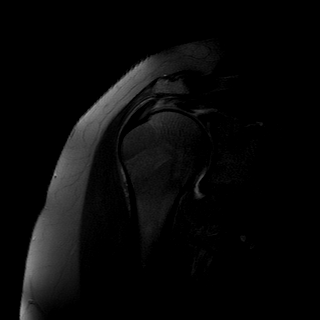
[im 11/15]
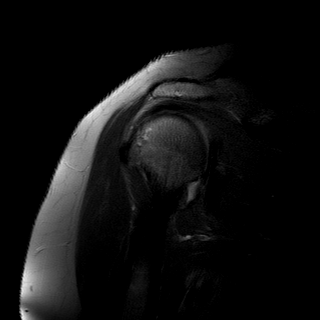
[im 15/15]
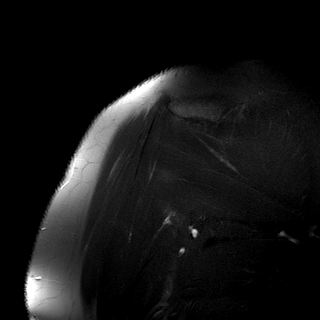

[Series 6: pdfs coronal · oblique · 4.0mm · 0.50mm/px · 5 of 15 slices shown]
[im 1/15]
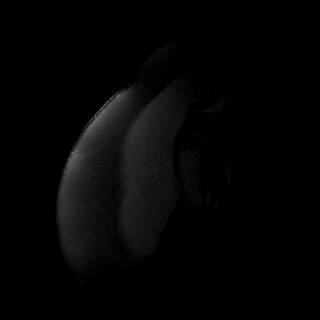
[im 4/15]
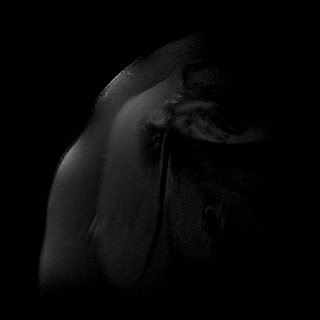
[im 8/15]
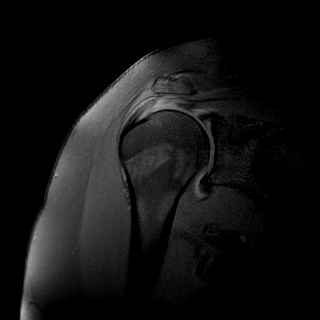
[im 11/15]
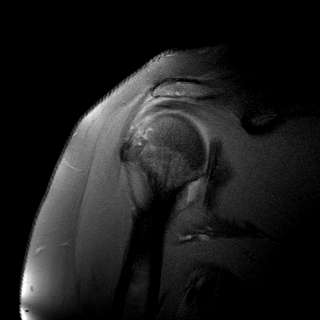
[im 15/15]
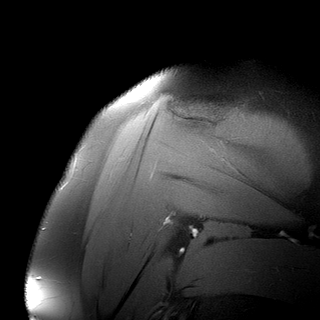

[Series 7: t2fs sagital · oblique · 4.0mm · 0.52mm/px · 5 of 15 slices shown]
[im 1/15]
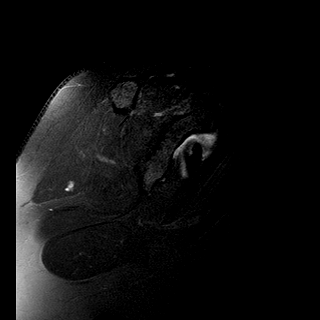
[im 4/15]
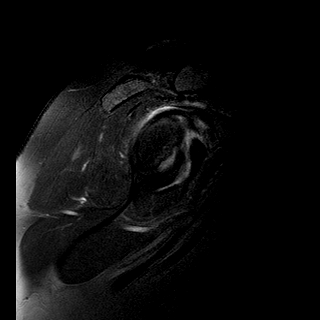
[im 8/15]
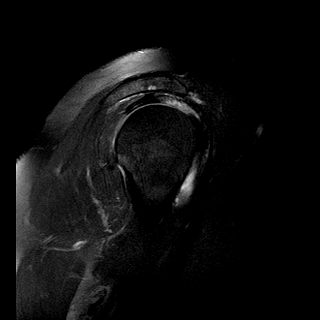
[im 11/15]
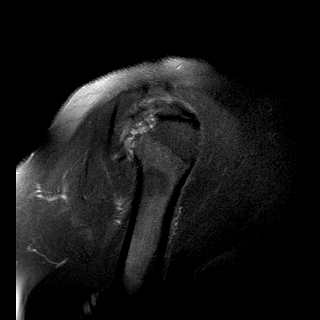
[im 15/15]
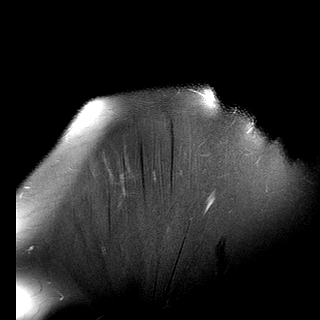

[Series 8: pdfs axial repeat · axial · 4.0mm · 0.27mm/px · z∈[-21,+44]mm · 6 of 20 slices shown]
[im 1/20]
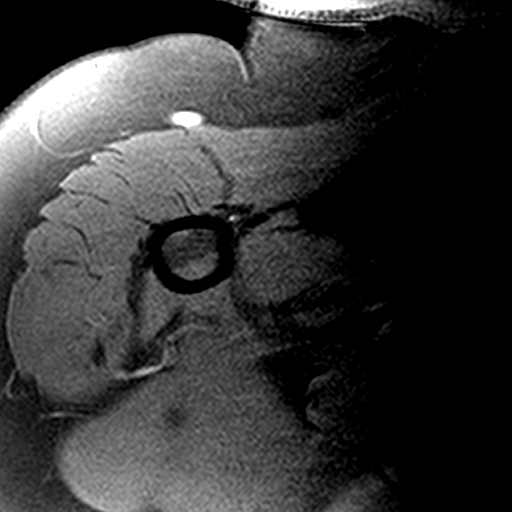
[im 4/20]
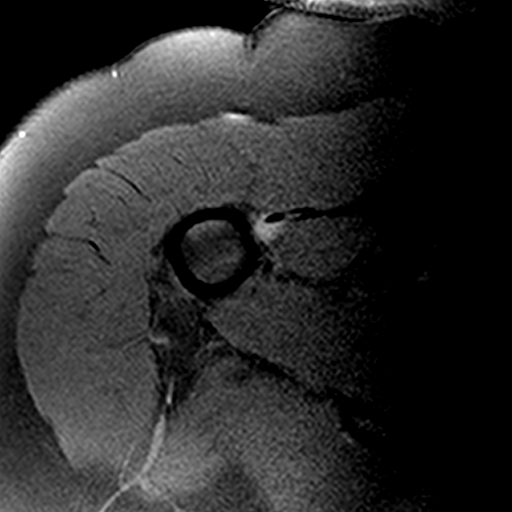
[im 7/20]
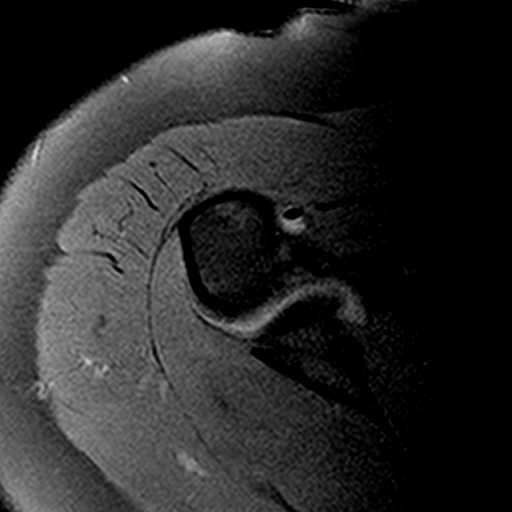
[im 10/20]
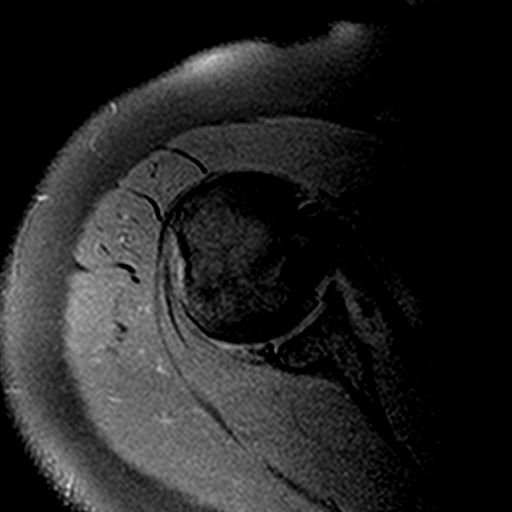
[im 13/20]
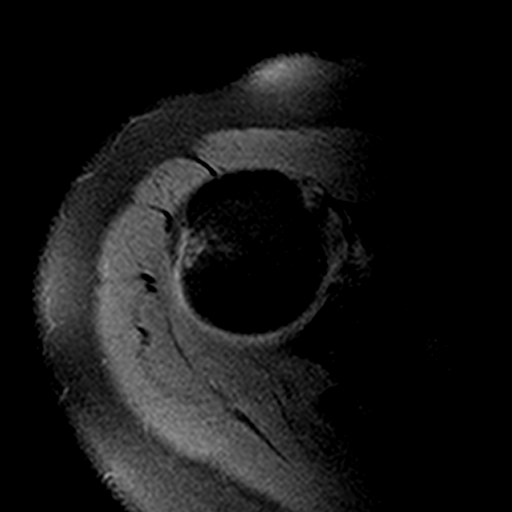
[im 16/20]
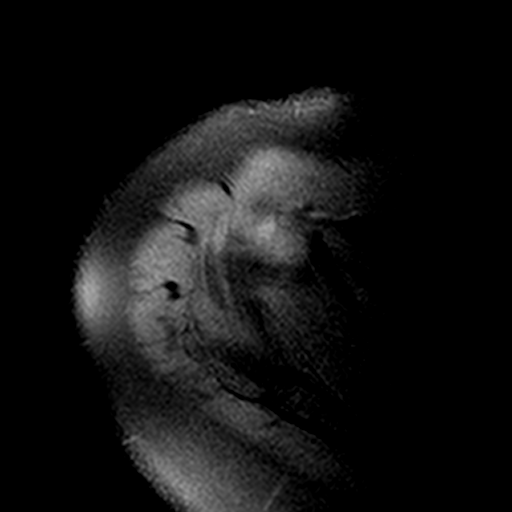

[34 of 40 positions shown; findings below may reference images not displayed]

FINDINGS: There is a severely diseased supraspinatus tendon.  There is a full thickness tear with a gap of about 1.3 cm.  Some intact fibers do persist and the tendon is not completely retracted. There is a partial tear of the distal infraspinatus tendon at the attachment. Subscapularis and teres minor components appear normal.  There is a small amount of fluid in the subacromial and subdeltoid bursa and in the joint itself.  The acromial anatomy is not unfavorable.  The AC joint does not grossly impinge upon the cuff.  No significant glenohumeral joint pathology is seen. There may be some mild labral degeneration.  The biceps tendon is intact.
IMPRESSION: 1.  Full thickness tear of the supraspinatus tendon with a hole of about 1.3 cm in size.  Some intact fibers do persist and the tendon is not completely retracted.
 2.  Partial thickness tear of the infraspinatus insertion.
 MRI OF THE LEFT SHOULDER WITHOUT CONTRAST:
FINDINGS: The patient has similar findings on the left side when compared to the right.  There is a 1 to 1.5 cm region of tendinopathy of the supraspinatus tendon with full thickness tendon derangement.  I don?t see a distinct fluid-filled hole on this side.  Nonetheless, it would be possible for there to be a full thickness discontinuity, but there is certainly no retraction. There are changes of mild tendinopathy of the distal infraspinatus tendon with minimal partial thickness tearing. Subscapularis and teres minor components are normal.  There is a small amount of fluid in the subacromial subdeltoid bursa.  The acromion is not grossly unfavorable.  There is mild degenerative disease of the AC joint, but this does not appear to impinge upon the cuff.
IMPRESSION: 1.  Full thickness tendon pathology of the supraspinatus tendon without a definable fluid-filled hole or any retraction.  I am suspicious that there is a full thickness tear in this region that would allow some fluid to leak into the subacromial subdeltoid bursa but there is certainly no large fluid-filled hole or tendon retraction.
 2.  Minimal partial thickness tear of the infraspinatus tendon insertion region.

## 2008-02-11 ENCOUNTER — Encounter: Payer: Self-pay | Admitting: Family Medicine

## 2008-04-14 ENCOUNTER — Encounter: Payer: Self-pay | Admitting: Family Medicine

## 2008-04-19 ENCOUNTER — Encounter: Payer: Self-pay | Admitting: Family Medicine

## 2008-05-10 ENCOUNTER — Ambulatory Visit: Payer: Self-pay | Admitting: Family Medicine

## 2008-05-10 LAB — CONVERTED CEMR LAB
Glucose, Bld: 70 mg/dL
Hgb A1c MFr Bld: 6.5 %

## 2008-06-14 ENCOUNTER — Encounter: Payer: Self-pay | Admitting: Family Medicine

## 2008-07-02 ENCOUNTER — Encounter: Payer: Self-pay | Admitting: Family Medicine

## 2008-07-08 ENCOUNTER — Encounter: Payer: Self-pay | Admitting: Family Medicine

## 2008-08-18 ENCOUNTER — Encounter: Payer: Self-pay | Admitting: Family Medicine

## 2008-08-31 ENCOUNTER — Telehealth: Payer: Self-pay | Admitting: Family Medicine

## 2008-09-01 ENCOUNTER — Encounter: Payer: Self-pay | Admitting: Family Medicine

## 2008-09-08 ENCOUNTER — Encounter: Payer: Self-pay | Admitting: Family Medicine

## 2008-09-08 LAB — CONVERTED CEMR LAB
ALT: 13 units/L (ref 0–35)
AST: 15 units/L (ref 0–37)
Albumin: 4.2 g/dL (ref 3.5–5.2)
Alkaline Phosphatase: 89 units/L (ref 39–117)
BUN: 10 mg/dL (ref 6–23)
Basophils Absolute: 0 10*3/uL (ref 0.0–0.1)
Basophils Relative: 0 % (ref 0–1)
Bilirubin, Direct: 0.1 mg/dL (ref 0.0–0.3)
CO2: 24 meq/L (ref 19–32)
Calcium: 9.9 mg/dL (ref 8.4–10.5)
Chloride: 103 meq/L (ref 96–112)
Cholesterol: 165 mg/dL (ref 0–200)
Creatinine, Ser: 1.03 mg/dL (ref 0.40–1.20)
Creatinine, Urine: 68.1 mg/dL
Eosinophils Absolute: 0.1 10*3/uL (ref 0.0–0.7)
Eosinophils Relative: 2 % (ref 0–5)
Glucose, Bld: 90 mg/dL (ref 70–99)
HCT: 39 % (ref 36.0–46.0)
HDL: 44 mg/dL (ref >39)
Hemoglobin: 12.5 g/dL (ref 12.0–15.0)
Indirect Bilirubin: 0.1 mg/dL (ref 0.0–0.9)
LDL Cholesterol: 88 mg/dL (ref 0–99)
Lymphocytes Relative: 25 % (ref 12–46)
Lymphs Abs: 1.5 10*3/uL (ref 0.7–4.0)
MCHC: 32.1 g/dL (ref 30.0–36.0)
MCV: 92 fL (ref 78.0–100.0)
Microalb Creat Ratio: 304.6 mg/g — ABNORMAL HIGH (ref 0.0–30.0)
Microalb, Ur: 20.74 mg/dL — ABNORMAL HIGH (ref 0.00–1.89)
Monocytes Absolute: 0.6 10*3/uL (ref 0.1–1.0)
Monocytes Relative: 11 % (ref 3–12)
Neutro Abs: 3.7 10*3/uL (ref 1.7–7.7)
Neutrophils Relative %: 63 % (ref 43–77)
Platelets: 417 10*3/uL — ABNORMAL HIGH (ref 150–400)
Potassium: 4.8 meq/L (ref 3.5–5.3)
RBC: 4.24 M/uL (ref 3.87–5.11)
RDW: 17.4 % — ABNORMAL HIGH (ref 11.5–15.5)
Sodium: 142 meq/L (ref 135–145)
TSH: 1.741 microintl units/mL (ref 0.350–4.500)
Total Bilirubin: 0.2 mg/dL — ABNORMAL LOW (ref 0.3–1.2)
Total CHOL/HDL Ratio: 3.8
Total Protein: 8.1 g/dL (ref 6.0–8.3)
Triglycerides: 167 mg/dL — ABNORMAL HIGH (ref <150)
VLDL: 33 mg/dL (ref 0–40)
WBC: 5.9 10*3/uL (ref 4.0–10.5)

## 2008-09-15 ENCOUNTER — Ambulatory Visit: Payer: Self-pay | Admitting: Family Medicine

## 2008-09-15 DIAGNOSIS — G4730 Sleep apnea, unspecified: Secondary | ICD-10-CM | POA: Insufficient documentation

## 2008-10-11 ENCOUNTER — Encounter: Payer: Self-pay | Admitting: Family Medicine

## 2008-10-15 ENCOUNTER — Ambulatory Visit: Payer: Self-pay | Admitting: Family Medicine

## 2008-10-21 ENCOUNTER — Telehealth: Payer: Self-pay | Admitting: Family Medicine

## 2008-11-25 ENCOUNTER — Encounter: Payer: Self-pay | Admitting: Family Medicine

## 2008-12-04 IMAGING — CT CT ABDOMEN W/O CM
2 of 3 series · 16 of 46 positions shown, 18 images · IV contrast (agent unspecified)
Comparison: 04/11/05.

CLINICAL DATA: Left renal calculus.  History of bilateral lithotripsy.  
ABDOMEN CT WITHOUT CONTRAST:
TECHNIQUE: Multidetector CT imaging of the abdomen was performed following the standard protocol without IV contrast.
TECHNIQUE: Multidetector CT imaging of the pelvis was performed following the standard protocol without IV contrast.

[Series 2: stone 5.0 b40f · axial · 0.81mm/px · z∈[-422,-32]mm · 13 of 90 slices shown, 15 images]
[im 6/90  soft-tissue]
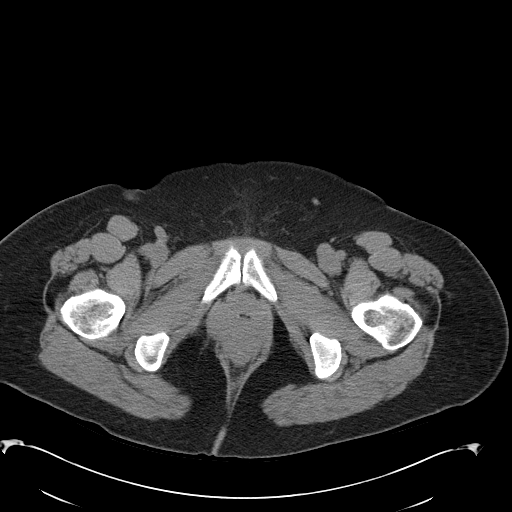
[im 6/90  bone]
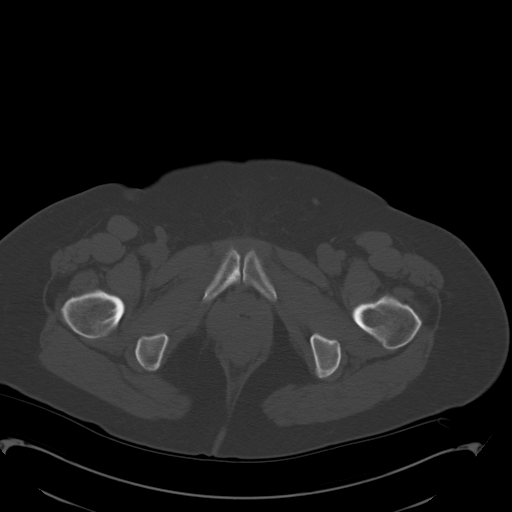
[im 12/90  soft-tissue]
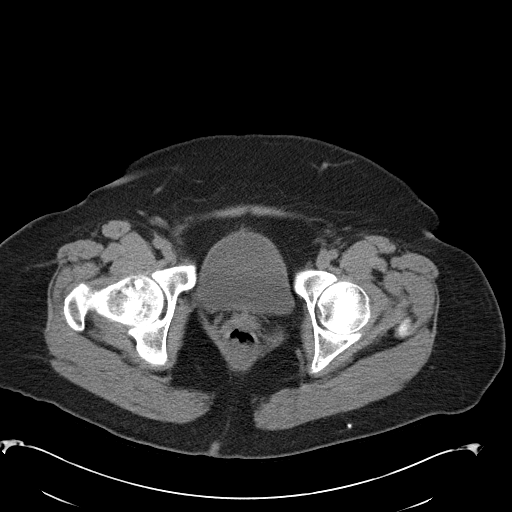
[im 18/90  soft-tissue]
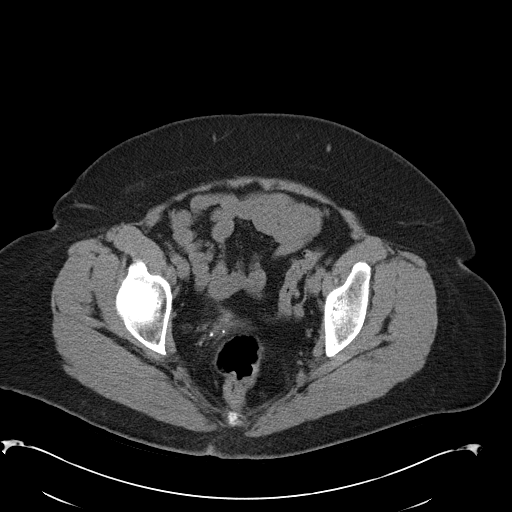
[im 26/90  soft-tissue]
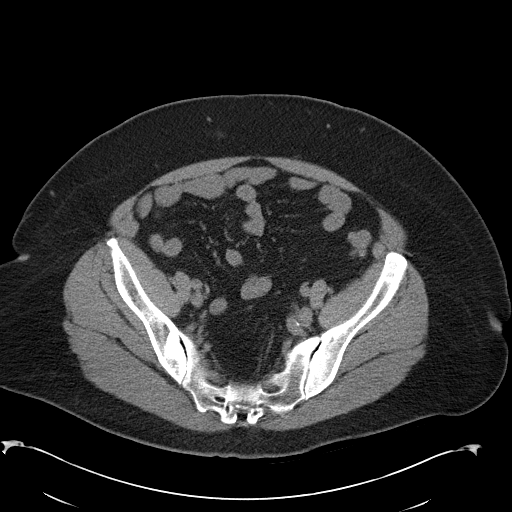
[im 32/90  soft-tissue]
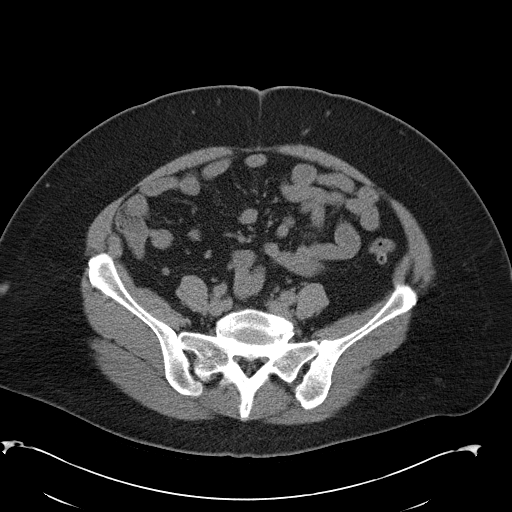
[im 38/90  soft-tissue]
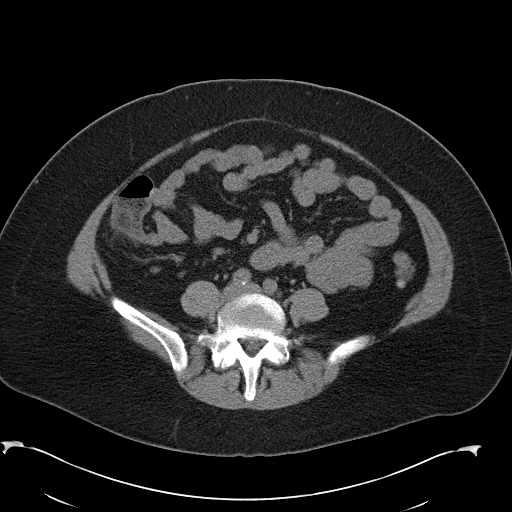
[im 46/90  soft-tissue]
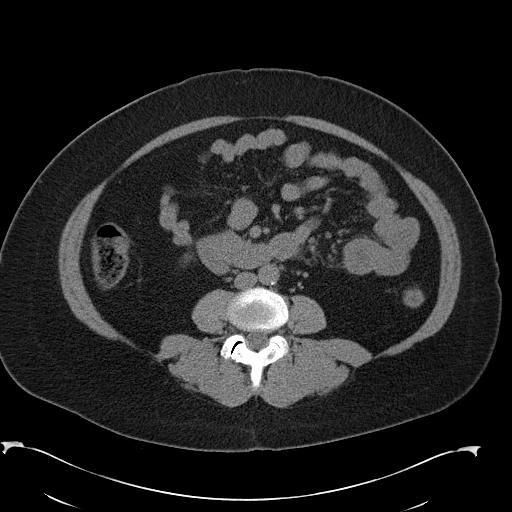
[im 52/90  soft-tissue]
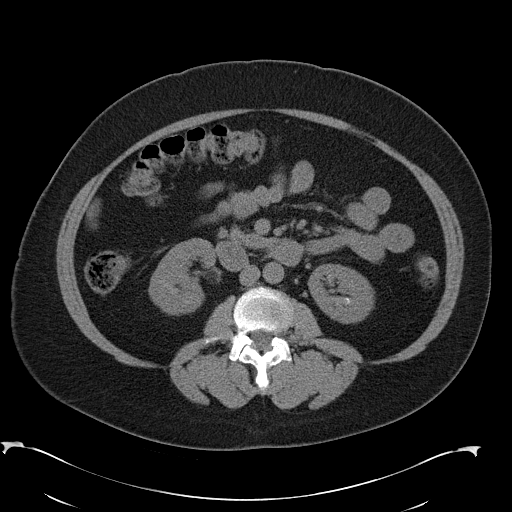
[im 58/90  soft-tissue]
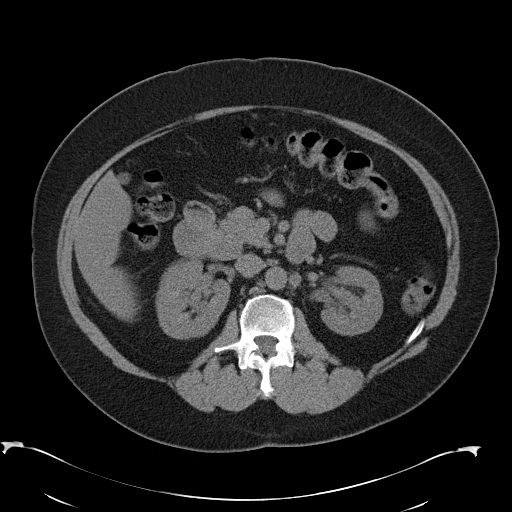
[im 58/90  bone]
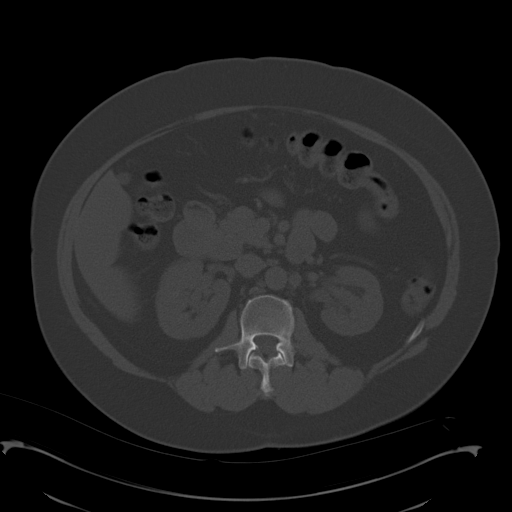
[im 64/90  soft-tissue]
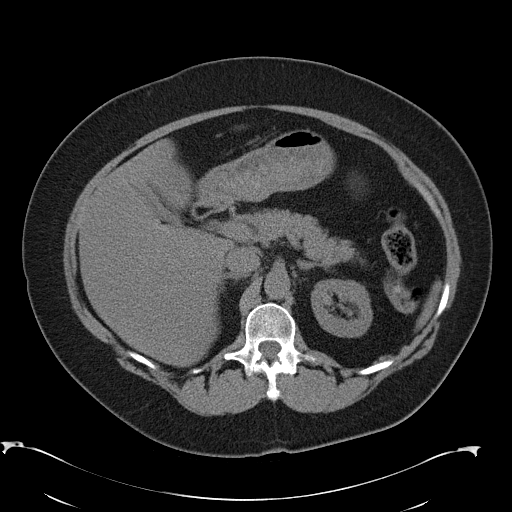
[im 72/90  soft-tissue]
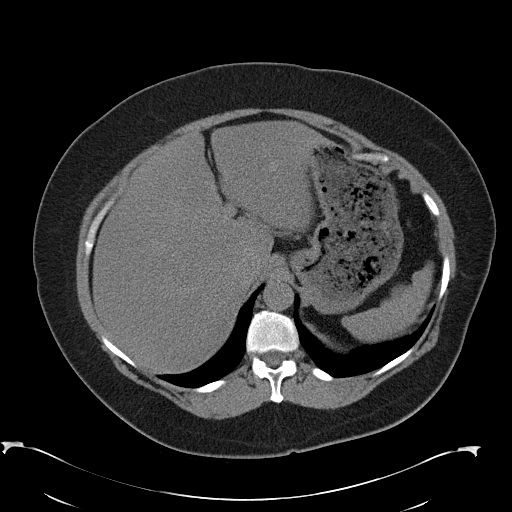
[im 78/90  soft-tissue]
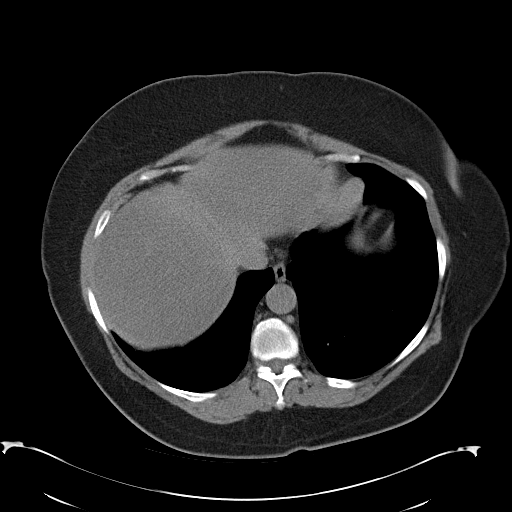
[im 84/90  soft-tissue]
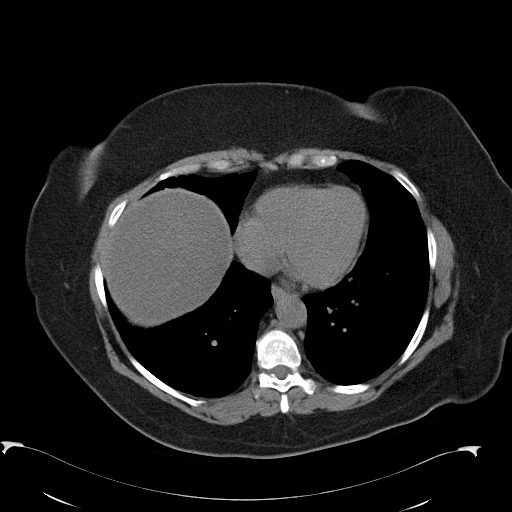

[Series 4: mpr coronal · coronal · 0.78mm/px · 3 of 83 slices shown]
[im 28/83  soft-tissue]
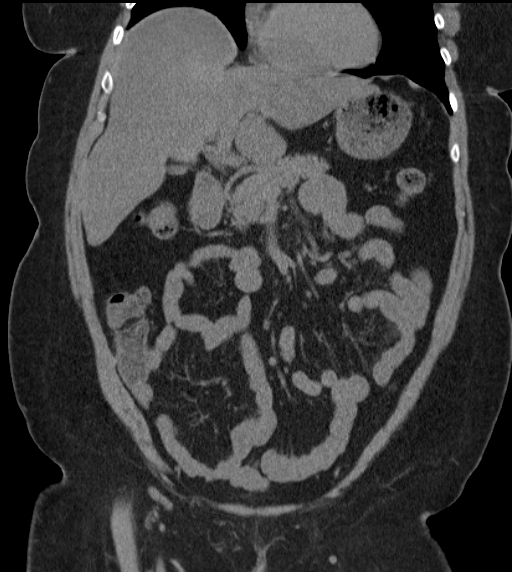
[im 37/83  soft-tissue]
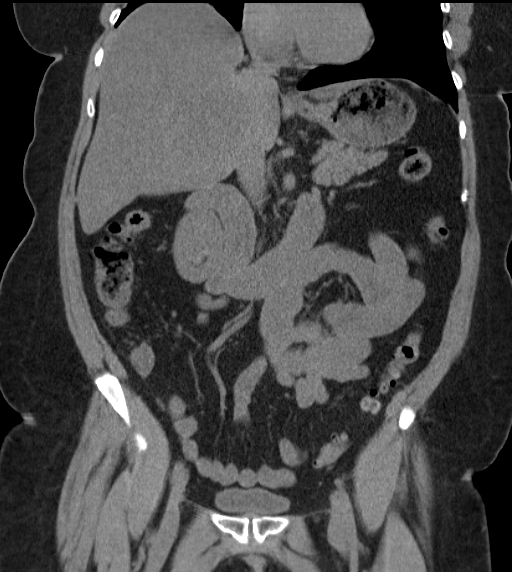
[im 46/83  soft-tissue]
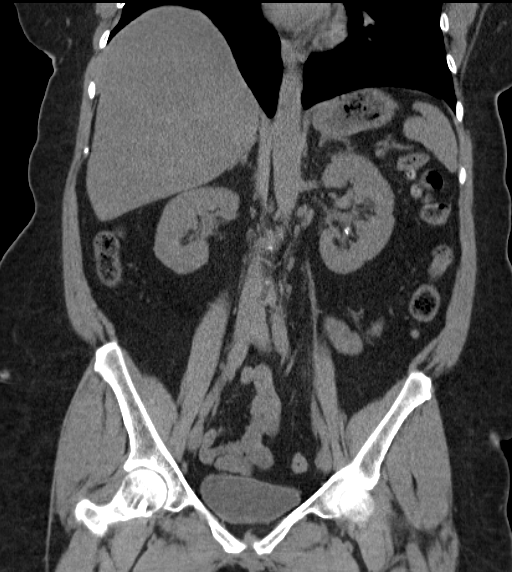

[16 of 46 positions shown; findings below may reference images not displayed]

FINDINGS: Small left-sided Bochdalek hernia.  Heart size is normal.  There is no pericardial or pleural effusion.  
The liver is of markedly decreased attenuation consistent with fatty infiltration.  The liver is also enlarged at 19.9 cm in craniocaudal span.  There is fat sparing adjacent to the gallbladder.
The spleen is normal.  The stomach is food filled.  The pancreas is normal.  The gallbladder is incompletely distended.  Adrenal glands are normal.  Punctate right upper pole renal calculus without obstruction.  There are numerous left renal medullary calcifications.  Largest stone is in the left lower pole collecting system measuring maximally 6 mm.  No hydronephrosis.  No ureteric stones.  A vascular calcification is immediately medial to the left ureter on image 63.  Small retroperitoneal lymph nodes but no adenopathy.  Descending colonic diverticulosis.  Appendix and terminal ileum normal.  
Small bowel is normal and there is no ascites.
IMPRESSION: 1.  Bilateral left greater than right renal calculi without evidence of obstruction or ureteric stone.  
2.  Fatty infiltration of the liver with hepatomegaly.  
PELVIS CT WITHOUT CONTRAST:
FINDINGS: Sigmoid colon diverticulosis.  Pelvic small bowel normal.   No distal ureteric stone.  Normal bladder.  Hysterectomy.  No free fluid or adenopathy. 
Bone windows demonstrate right femoral head sclerotic lesion which is likely a bone island.  Degenerative sclerosis about the right SI joint.
IMPRESSION: No acute findings in the pelvis.

## 2008-12-06 ENCOUNTER — Encounter: Payer: Self-pay | Admitting: Family Medicine

## 2008-12-20 IMAGING — CT CT HEAD W/O CM
1 series · 16 of 30 positions shown, 20 images · non-contrast
Comparison: None.

CLINICAL DATA: Headache, status post fall last month. 
 HEAD CT WITHOUT CONTRAST ? 07/11/06:
TECHNIQUE: Contiguous axial CT images were obtained from the base of the skull through the vertex according to standard protocol without contrast.

[Series 2: headseq 4.8 h37s · axial · 0.43mm/px · z∈[+128,+283]mm · 16 of 36 slices shown, 20 images]
[im 2/36  brain]
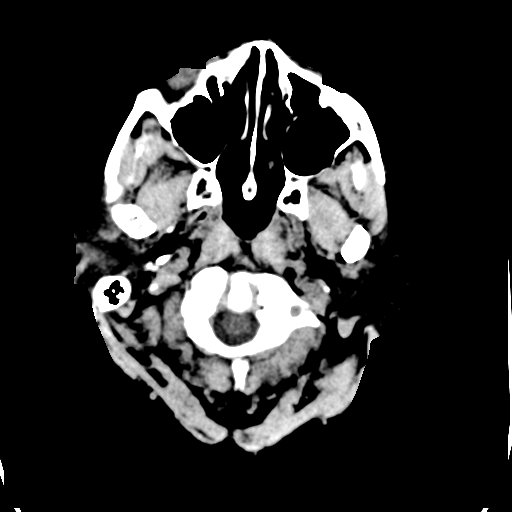
[im 2/36  bone]
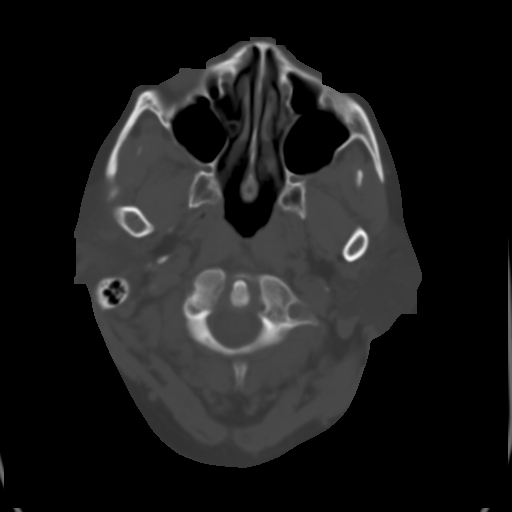
[im 4/36  brain]
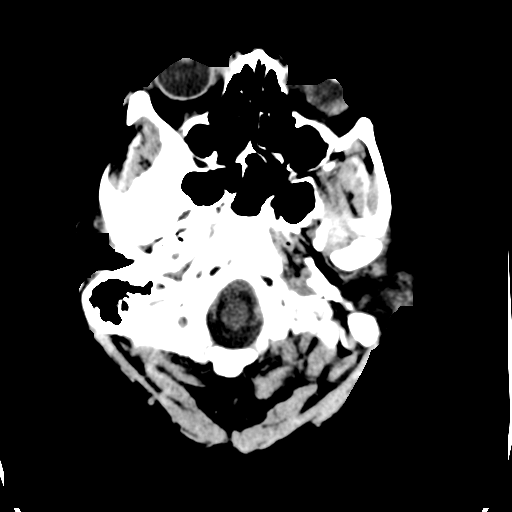
[im 7/36  brain]
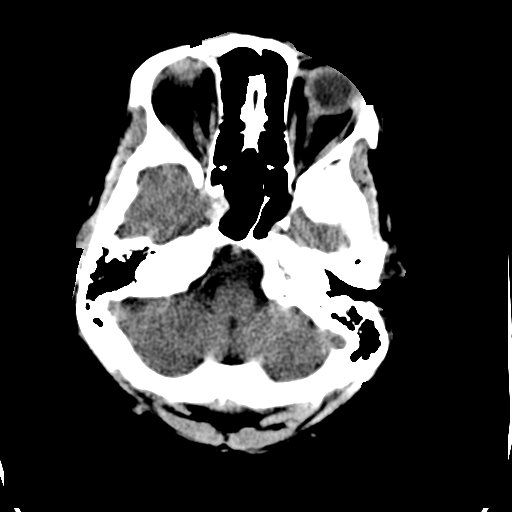
[im 9/36  brain]
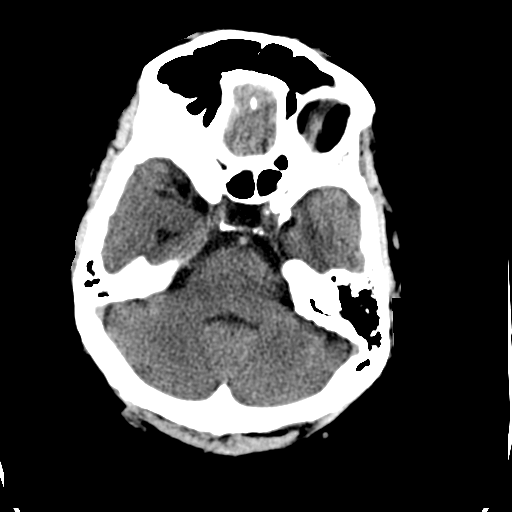
[im 10/36  brain]
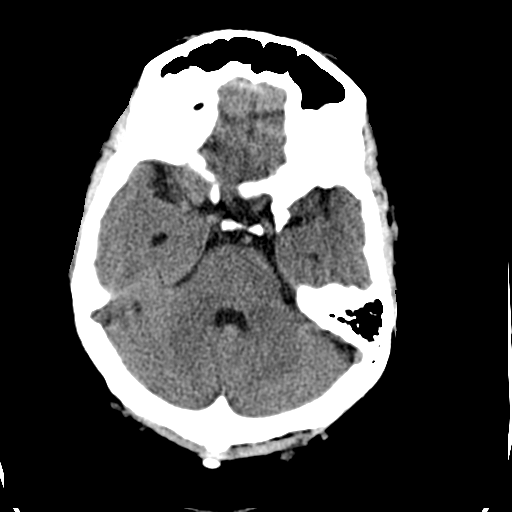
[im 10/36  bone]
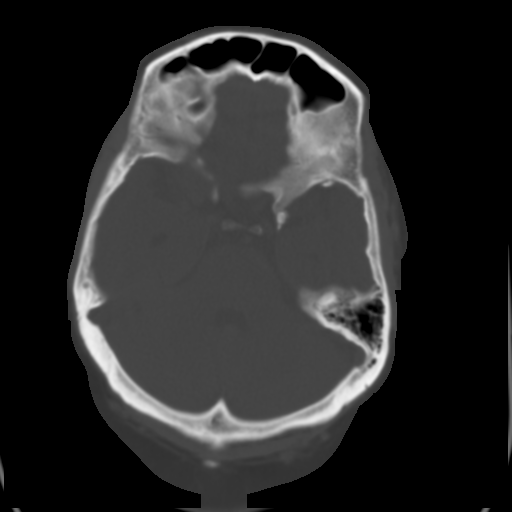
[im 13/36  brain]
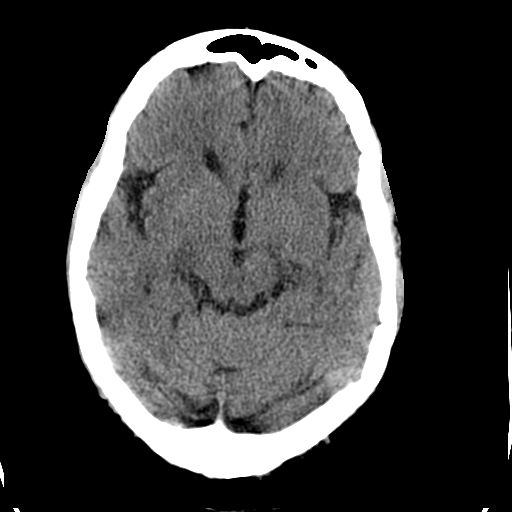
[im 15/36  brain]
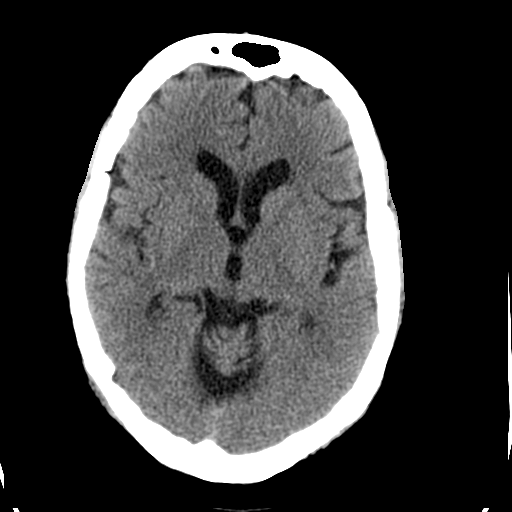
[im 17/36  brain]
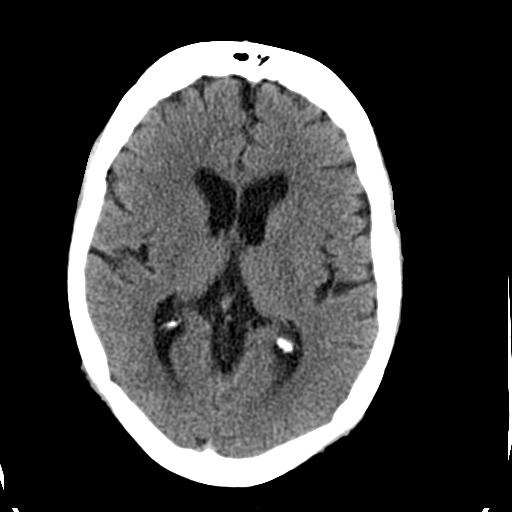
[im 19/36  brain]
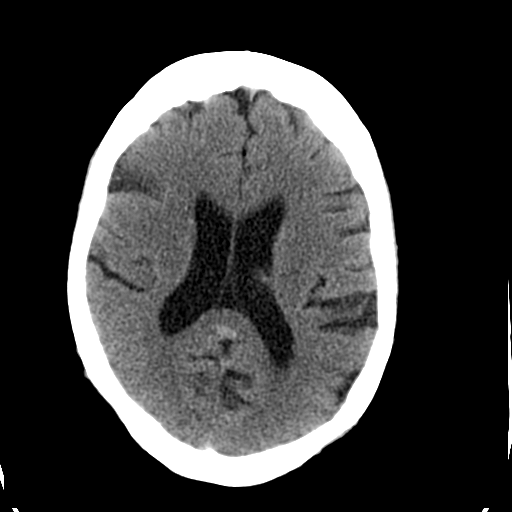
[im 19/36  bone]
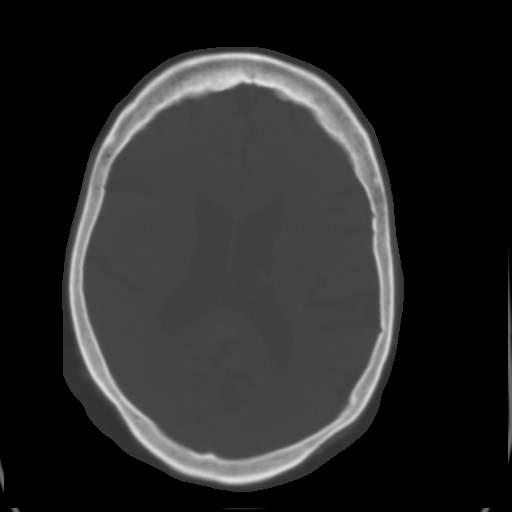
[im 21/36  brain]
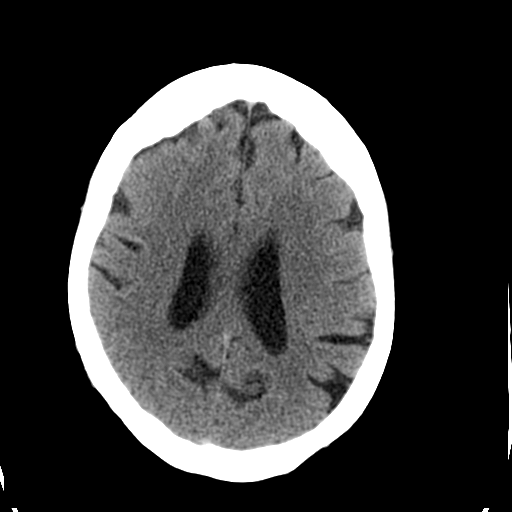
[im 23/36  brain]
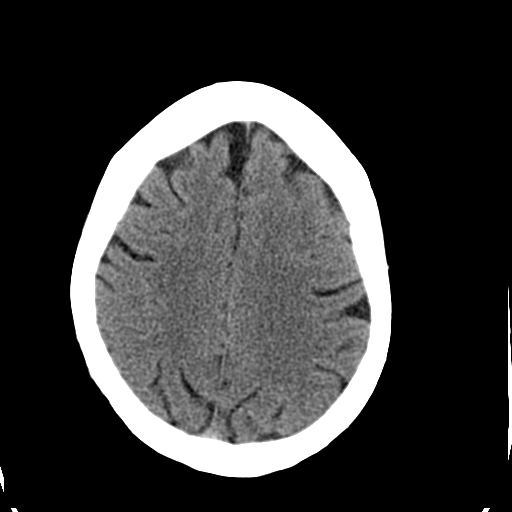
[im 26/36  brain]
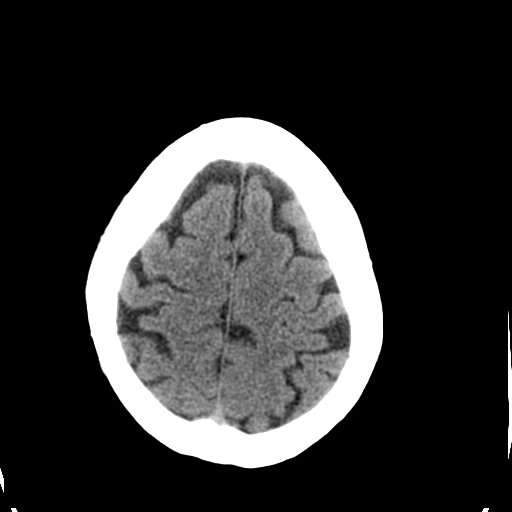
[im 27/36  brain]
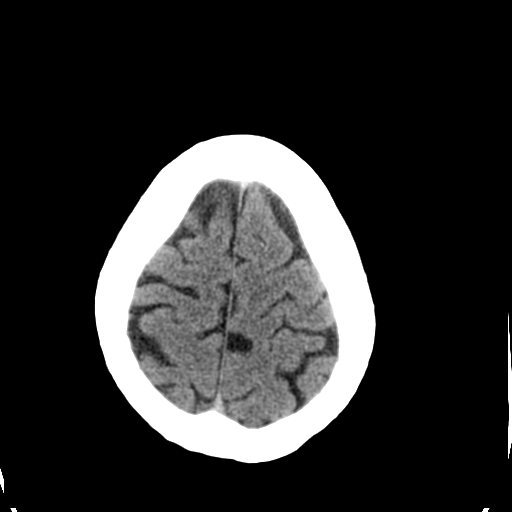
[im 27/36  bone]
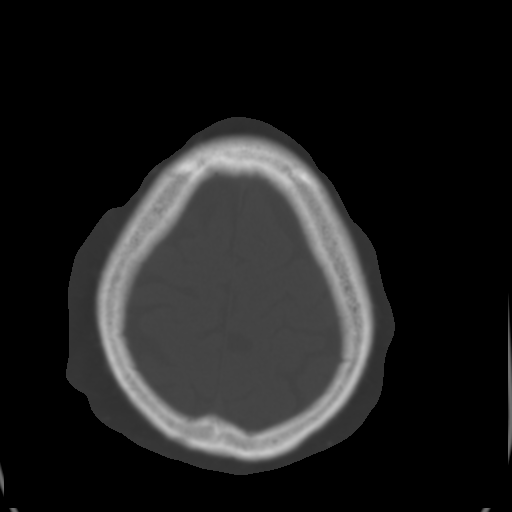
[im 29/36  brain]
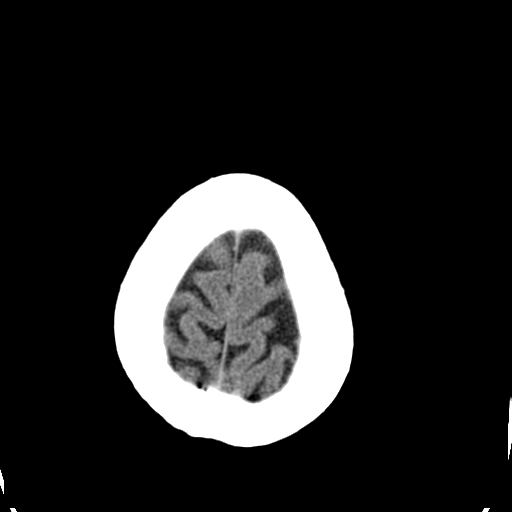
[im 32/36  brain]
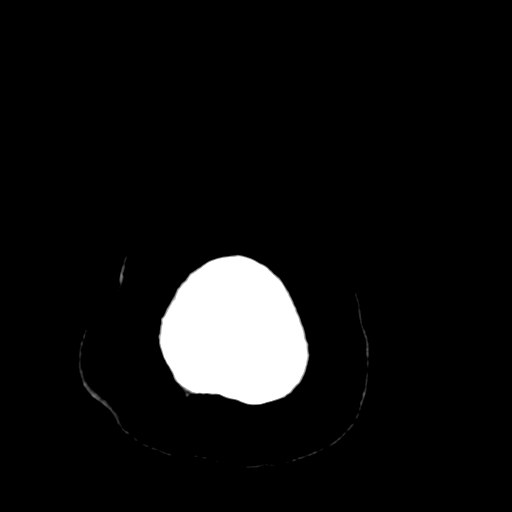
[im 34/36  brain]
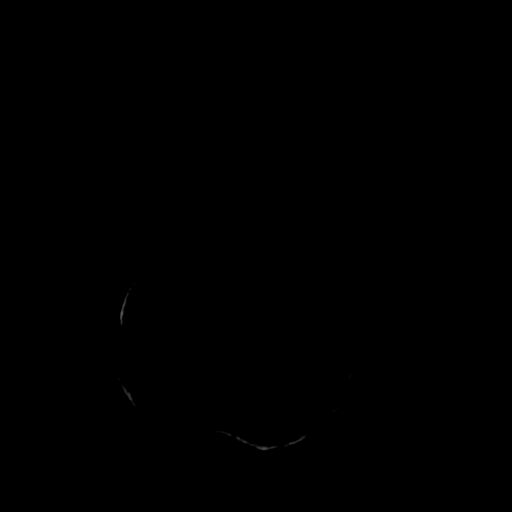

[16 of 30 positions shown; findings below may reference images not displayed]

FINDINGS: There is no evidence of acute intracranial hemorrhage, mass effect, or extra-axial fluid collection.  The ventricles and subarachnoid spaces are appropriately sized for age.  The visualized paranasal sinuses are clear.  The calvarium is intact.
IMPRESSION: Unremarkable unenhanced CT of the head.

## 2008-12-20 IMAGING — US US ABDOMEN COMPLETE
1 series · 13 of 25 positions shown · non-contrast
Comparison: CT scan 06/25/06.

CLINICAL DATA: 60-year-old, enlarged liver. Right upper quadrant abdominal pain. 
 ABDOMEN ULTRASOUND:
TECHNIQUE: Complete abdominal ultrasound examination was performed including evaluation of the liver, gallbladder, bile ducts, pancreas, kidneys, spleen, IVC, and abdominal aorta.

[Series 1: unknown · 0.34mm/px · 13 of 62 slices shown]
[im 1/62]
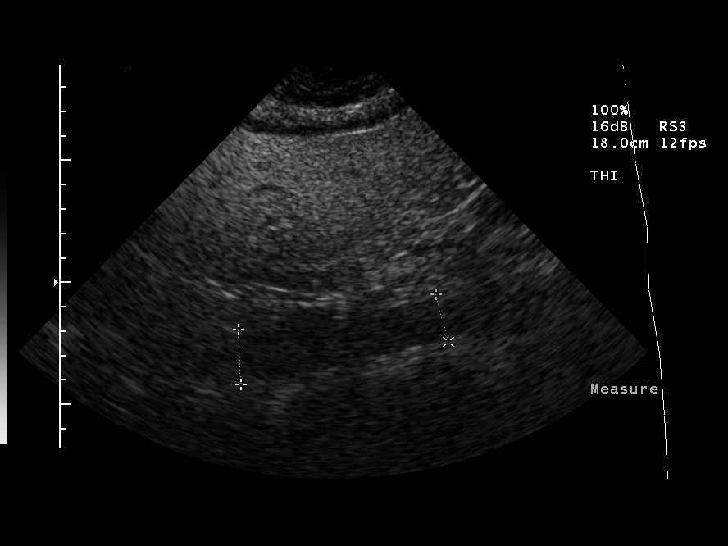
[im 6/62]
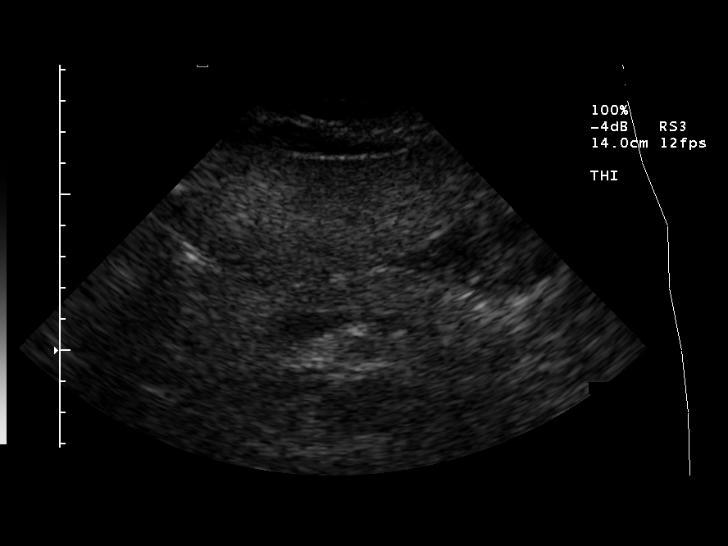
[im 11/62]
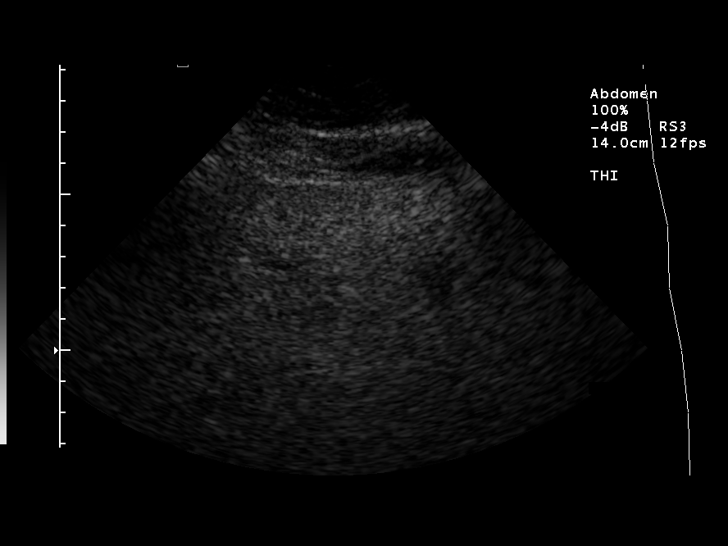
[im 16/62]
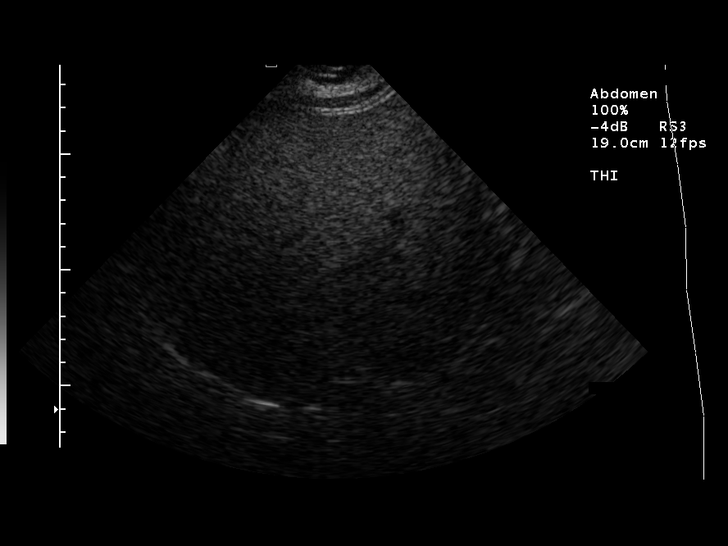
[im 21/62]
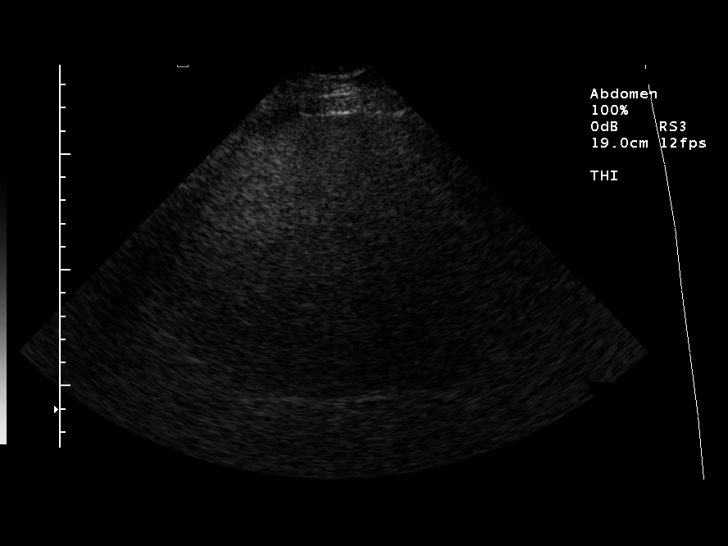
[im 26/62]
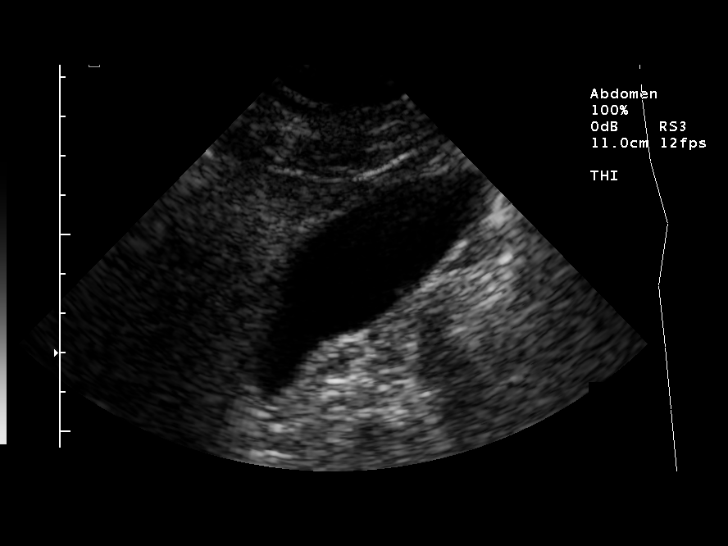
[im 31/62]
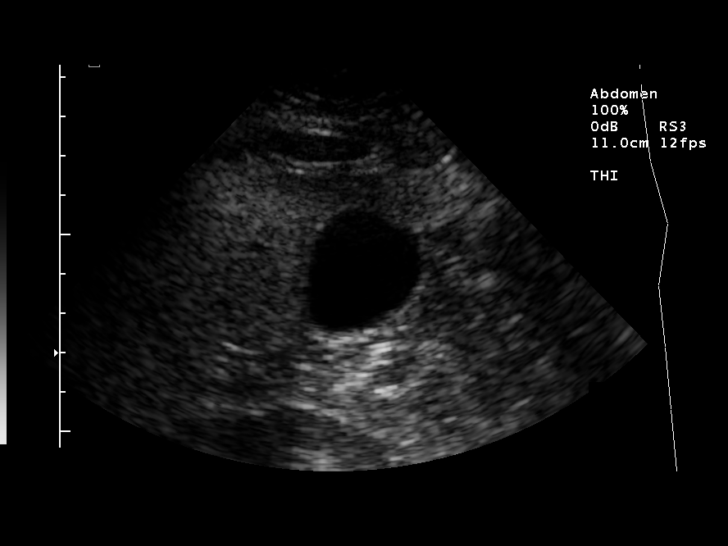
[im 36/62]
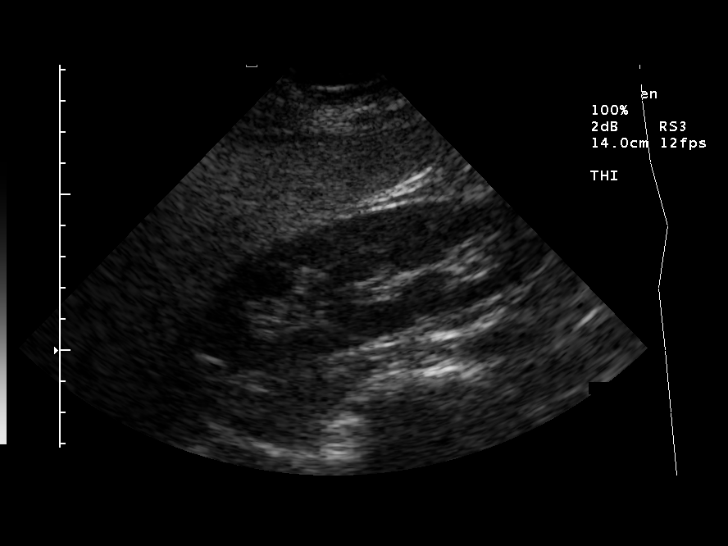
[im 41/62]
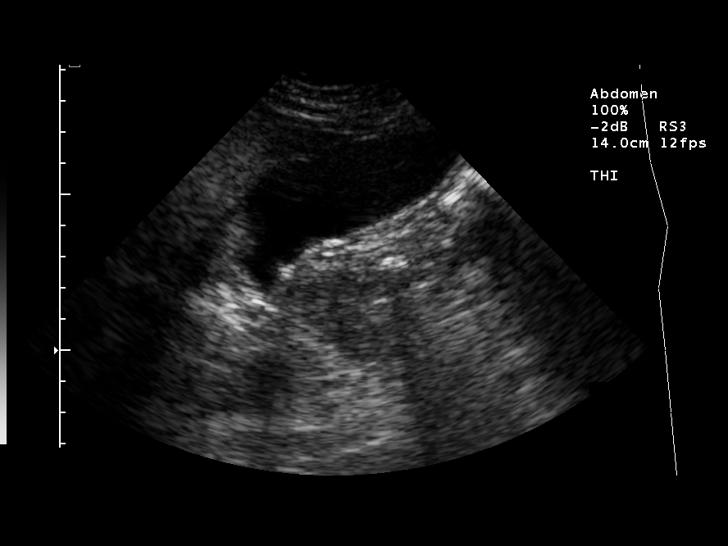
[im 46/62]
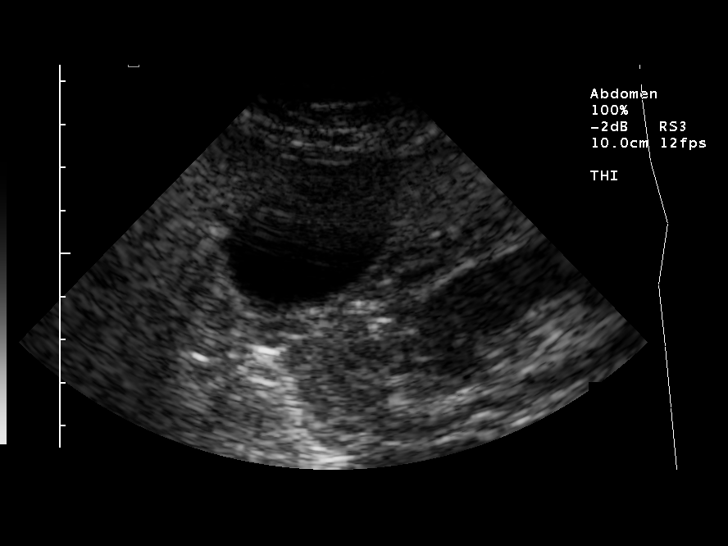
[im 51/62]
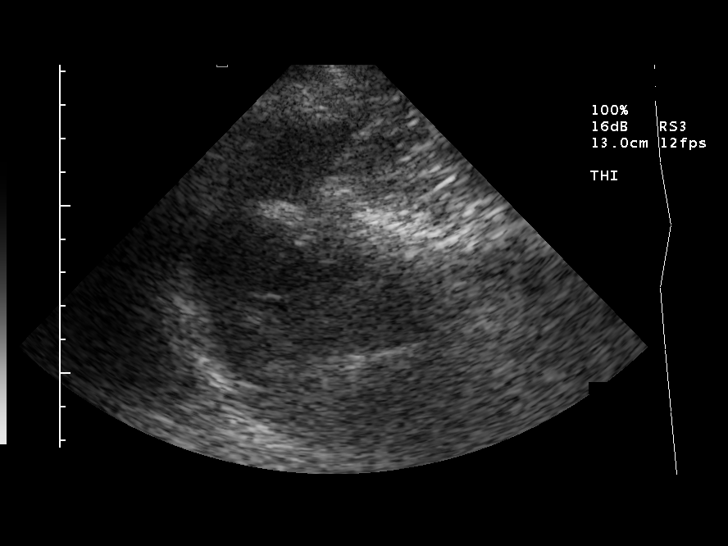
[im 56/62]
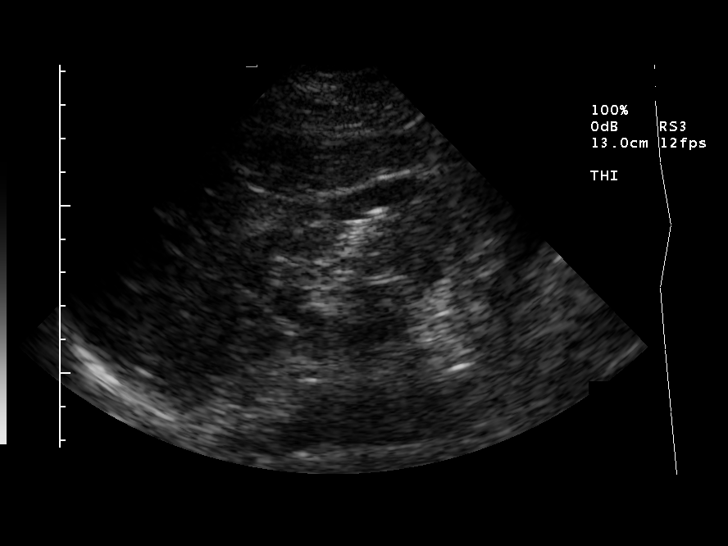
[im 62/62]
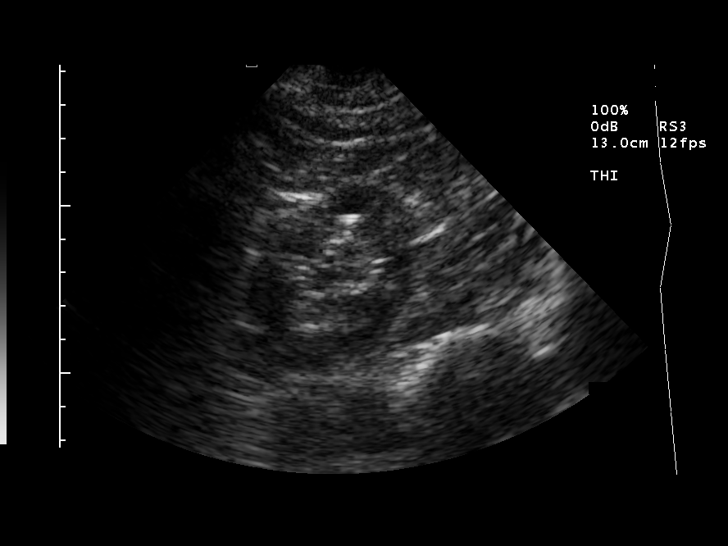

[13 of 25 positions shown; findings below may reference images not displayed]

FINDINGS: There is diffuse fatty infiltration of the liver with increased echogenicity, poor through transmission, and poor definition of the liver architecture.  No focal hepatic lesions or intrahepatic ductal dilatation.  The common bile duct measures 3.5 mm which is within normal limits.  Gallbladder is sonographically normal. 
 The pancreas is fairly well visualized and demonstrates no sonographic abnormalities.  The inferior vena cava and aorta are normal in caliber.  
 Spleen is normal in size and demonstrates normal echogenicity.  No focal lesions.  
 Right kidney measures 11.1 cm.  Left kidney measures 12.1 cm.  Dromedary hump left kidney, small bilateral renal calculi.  No hydronephrosis.  No perinephric fluid collections.
IMPRESSION: 1.  Diffuse fatty infiltration of the liver. 
 2.  Small bilateral renal calculi.
 3.  Normal gallbladder and normal caliber common bile duct.

## 2008-12-25 IMAGING — NM NM HEPATO W/GB/PHARM/[PERSON_NAME]
2 series · 12 of 12 positions shown · non-contrast
Comparison: none

HISTORY: Enlarged fatty liver, right upper quadrant pain

[Series 1: gb hepatobiliary scan · 3.27mm/px · 6 of 60 frames shown (1 of 2)]
[frame 6/60]
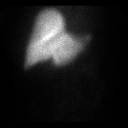
[frame 16/60]
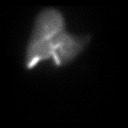
[frame 26/60]
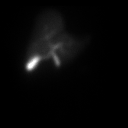
[frame 36/60]
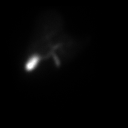
[frame 46/60]
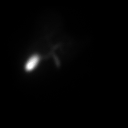
[frame 56/60]
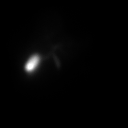

[Series 1: gb hepatobiliary scan · 3.27mm/px · 6 of 60 frames shown (2 of 2)]
[frame 6/60]
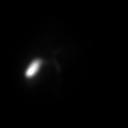
[frame 16/60]
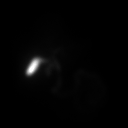
[frame 26/60]
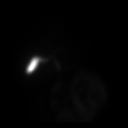
[frame 36/60]
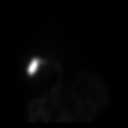
[frame 46/60]
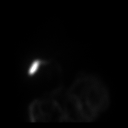
[frame 56/60]
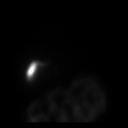

[12 of 12 positions shown; findings below may reference images not displayed]

HEPATOBILIARY SCAN WITH EJECTION FRACTION:

Hepatobiliary imaging performed using 5 mCi 4c-UUm mebrofenin.

Prompt tracer extraction from blood stream, indicating normal hepatocellular
function.
Prompt excretion of tracer into biliary tree.
Gallbladder visualized x 15 minutes.
Small bowel activity not identified until fatty meal stimulation.
No focal hepatic retention of tracer.

At 1 hour, patient ingested half-and-half, and imaging was continued for 60
minutes.
Normal emptying of tracer occurs from gallbladder following fatty meal
stimulation.
Calculated gallbladder ejection fraction 60%, normal.
IMPRESSION: Normal exam.

## 2008-12-29 ENCOUNTER — Telehealth: Payer: Self-pay | Admitting: Family Medicine

## 2009-01-19 ENCOUNTER — Encounter: Payer: Self-pay | Admitting: Cardiology

## 2009-01-19 ENCOUNTER — Ambulatory Visit: Payer: Self-pay | Admitting: Family Medicine

## 2009-01-19 DIAGNOSIS — R079 Chest pain, unspecified: Secondary | ICD-10-CM | POA: Insufficient documentation

## 2009-01-21 ENCOUNTER — Encounter: Payer: Self-pay | Admitting: Family Medicine

## 2009-01-21 DIAGNOSIS — E669 Obesity, unspecified: Secondary | ICD-10-CM | POA: Insufficient documentation

## 2009-01-24 ENCOUNTER — Ambulatory Visit (HOSPITAL_COMMUNITY): Admission: RE | Admit: 2009-01-24 | Discharge: 2009-01-24 | Payer: Self-pay | Admitting: Family Medicine

## 2009-01-31 ENCOUNTER — Telehealth: Payer: Self-pay | Admitting: Family Medicine

## 2009-02-07 ENCOUNTER — Encounter: Payer: Self-pay | Admitting: Family Medicine

## 2009-02-07 IMAGING — CR DG CHEST 2V
2 series · 2 of 2 positions shown · non-contrast
Comparison: None.

CLINICAL DATA: Hypertension, shortness of breath, productive cough.  
 CHEST - 2 VIEW:

[view not recorded (1 of 2)]
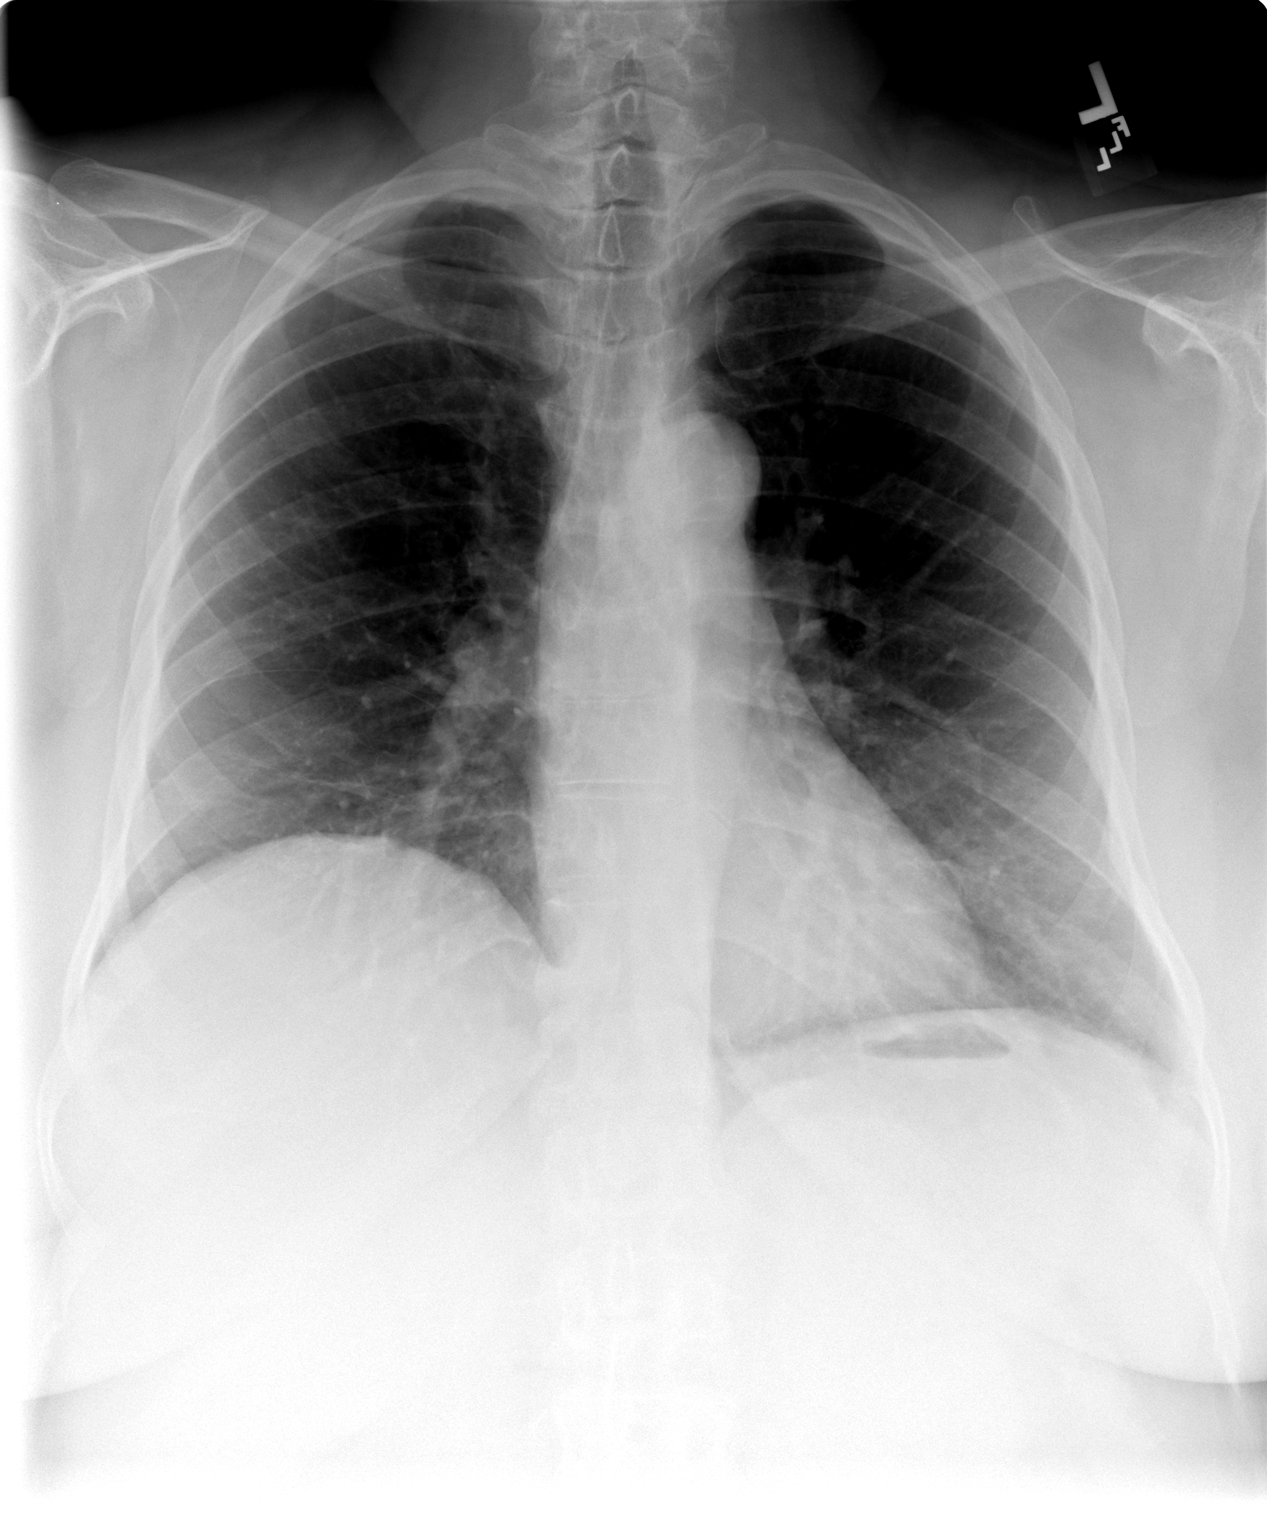

[view not recorded (2 of 2)]
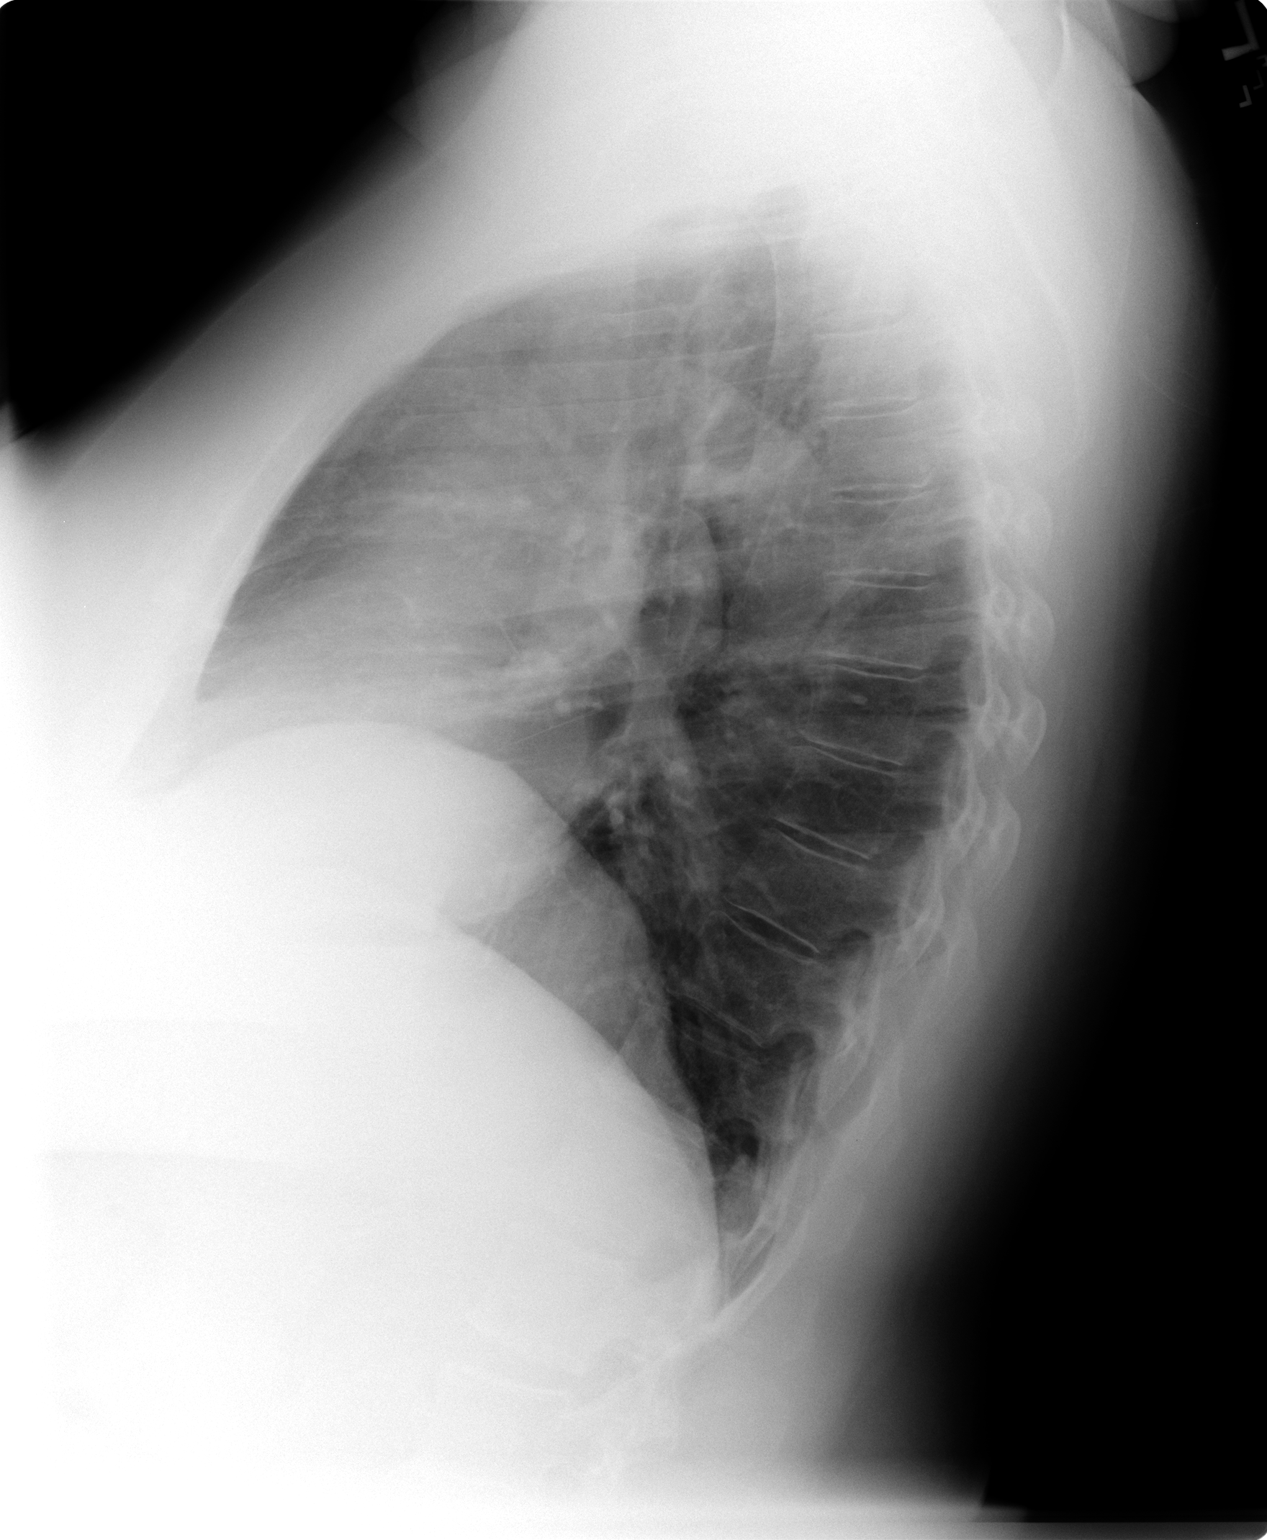

[2 of 2 positions shown; findings below may reference images not displayed]

FINDINGS: The trachea is midline. Heart size is normal.  The lungs are clear.   No pleural fluid.
IMPRESSION: No acute findings.

## 2009-02-11 IMAGING — MR MR LUMBAR SPINE W/O CM
4 of 7 series · 20 of 48 positions shown · non-contrast
Comparison: 03/13/2004

CLINICAL DATA: Low back pain radiating to the left leg.

MRI OF LUMBAR SPINE WITHOUT CONTRAST
TECHNIQUE: Multiplanar and multiecho pulse sequences of the lumbar spine, to
include the lower thoracic and upper sacral regions, were obtained according to
standard protocol without IV contrast.

[Series 3: T2 · sagittal · 4.0mm · 0.94mm/px · 6 of 13 slices shown (1 of 2)]
[im 1/13]
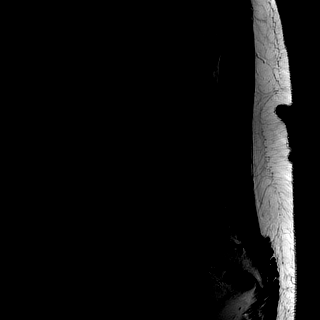
[im 3/13]
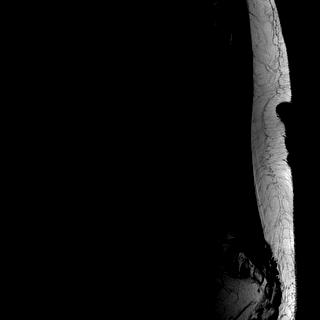
[im 5/13]
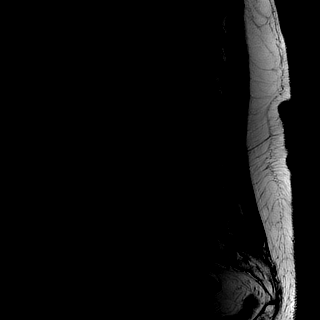
[im 8/13]
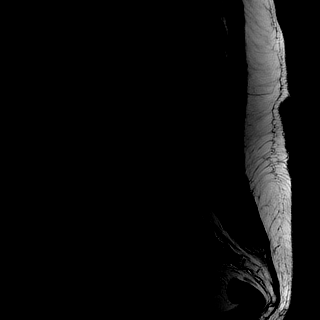
[im 10/13]
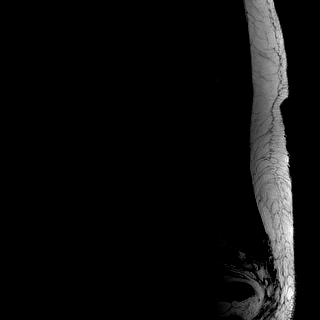
[im 13/13]
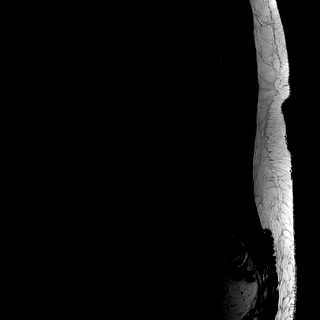

[Series 4: T1 · sagittal · 4.0mm · 0.47mm/px · 3 of 13 slices shown (1 of 2)]
[im 1/13]
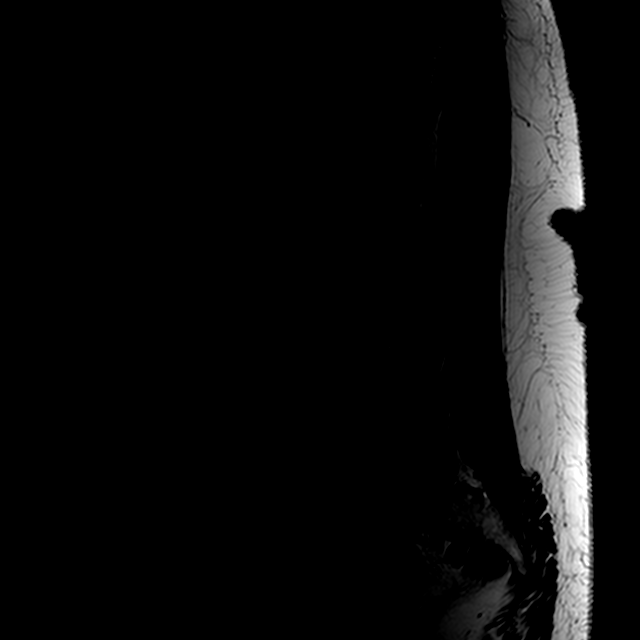
[im 7/13]
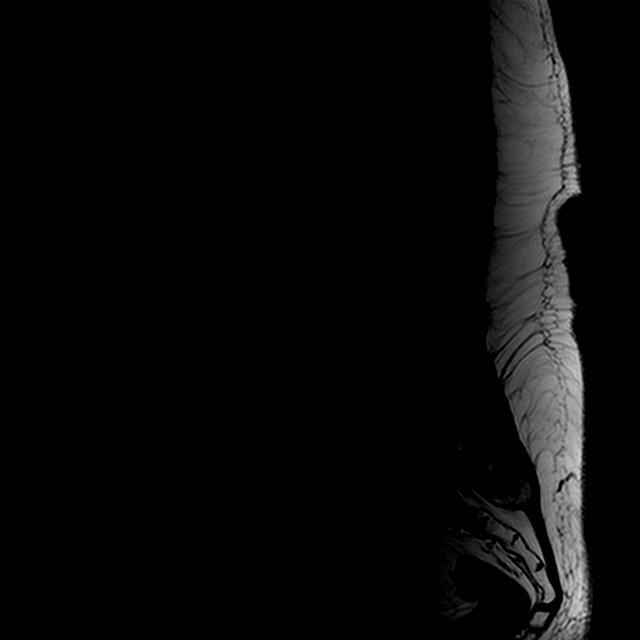
[im 13/13]
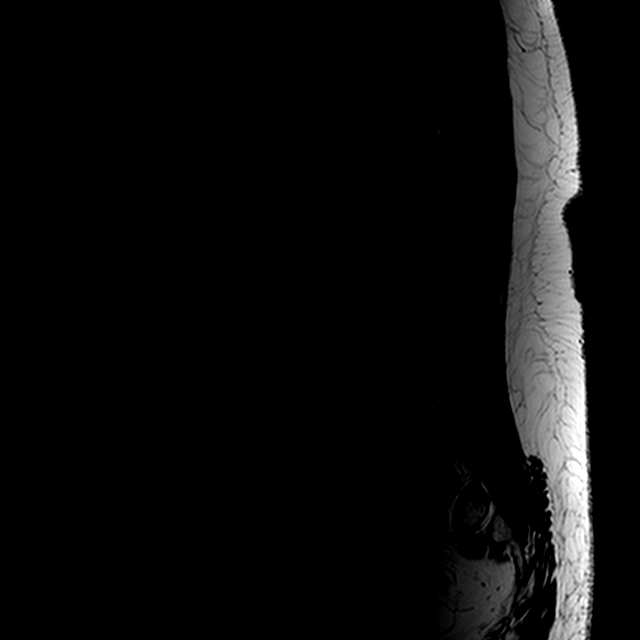

[Series 7: T2 · axial · 4.0mm · 0.34mm/px · z∈[-41,+156]mm · 8 of 25 slices shown (2 of 2)]
[im 1/25]
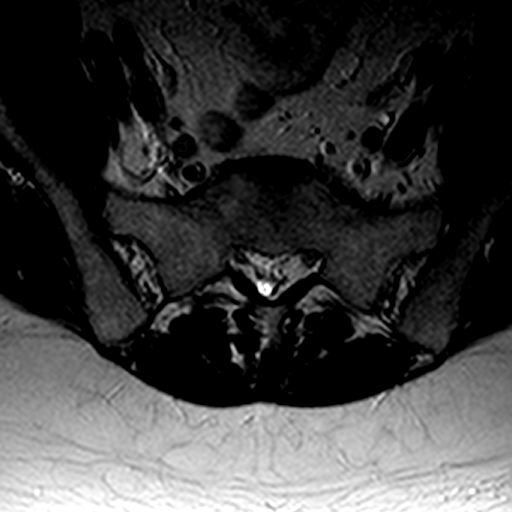
[im 4/25]
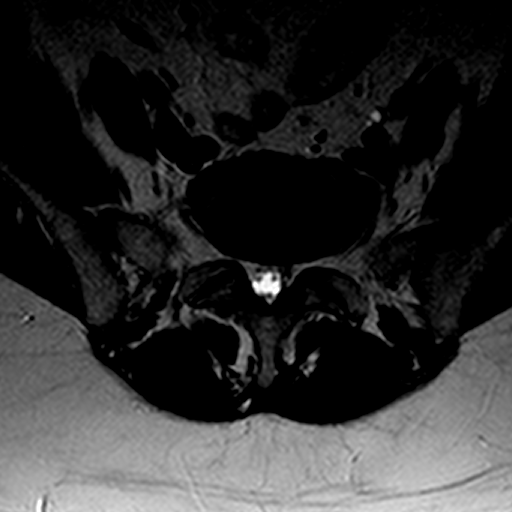
[im 7/25]
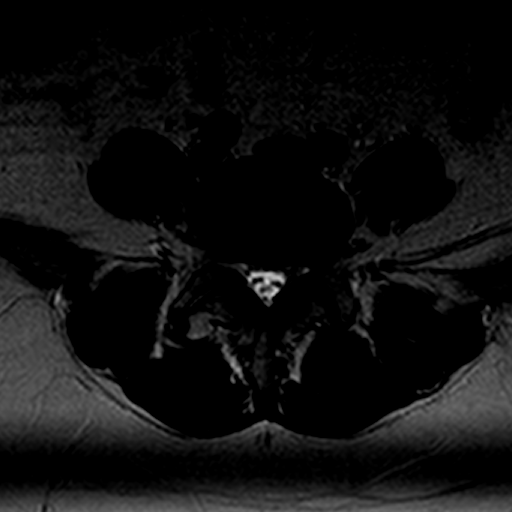
[im 10/25]
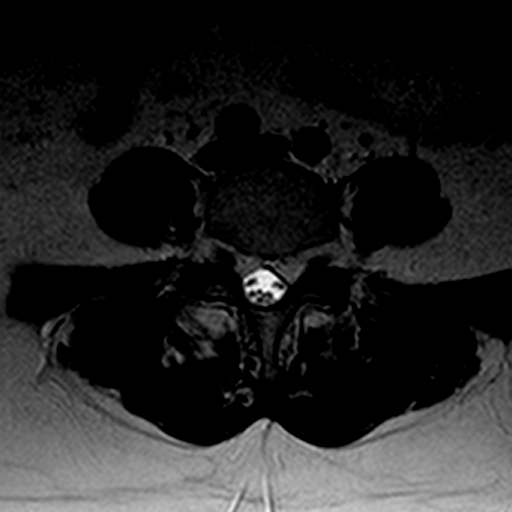
[im 13/25]
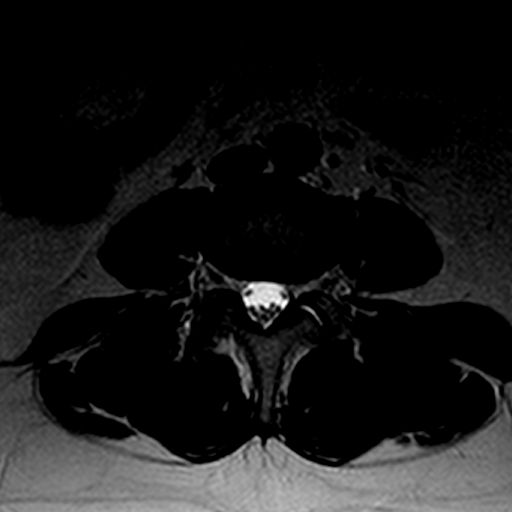
[im 16/25]
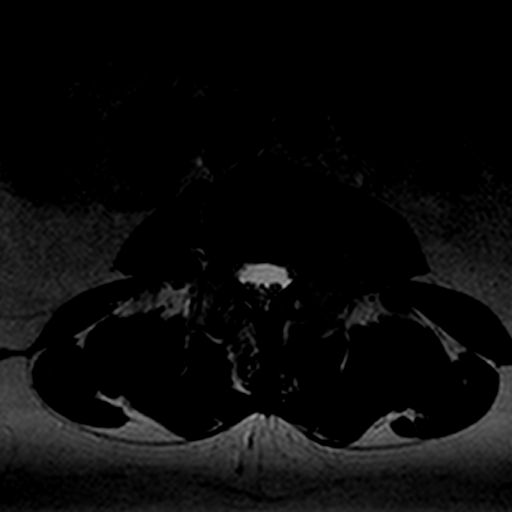
[im 19/25]
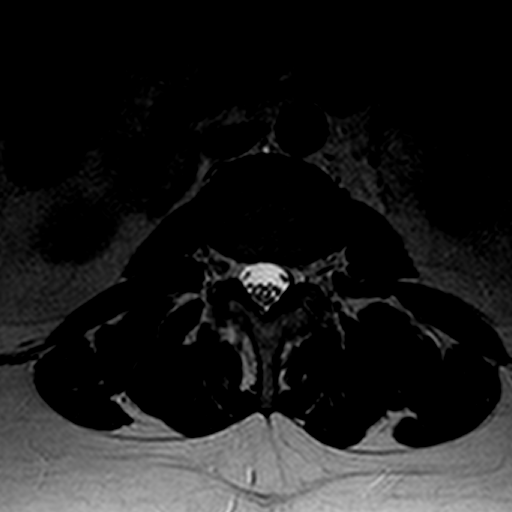
[im 22/25]
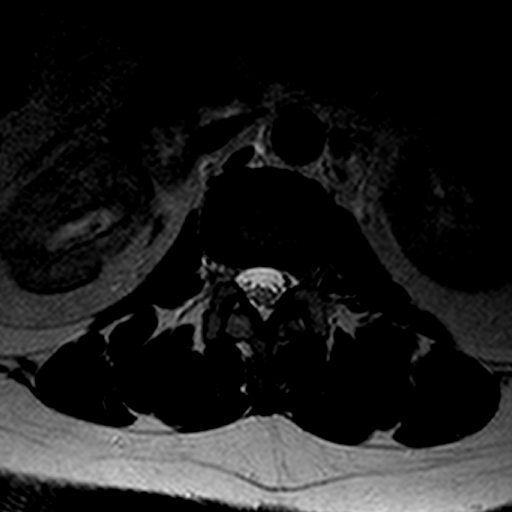

[Series 9: T1 · axial · 4.0mm · 0.27mm/px · z∈[-30,+156]mm · 3 of 25 slices shown (2 of 2)]
[im 4/25]
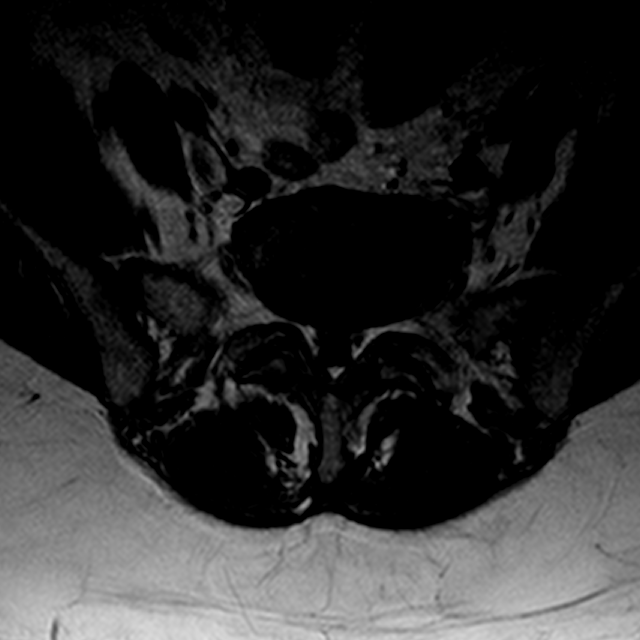
[im 13/25]
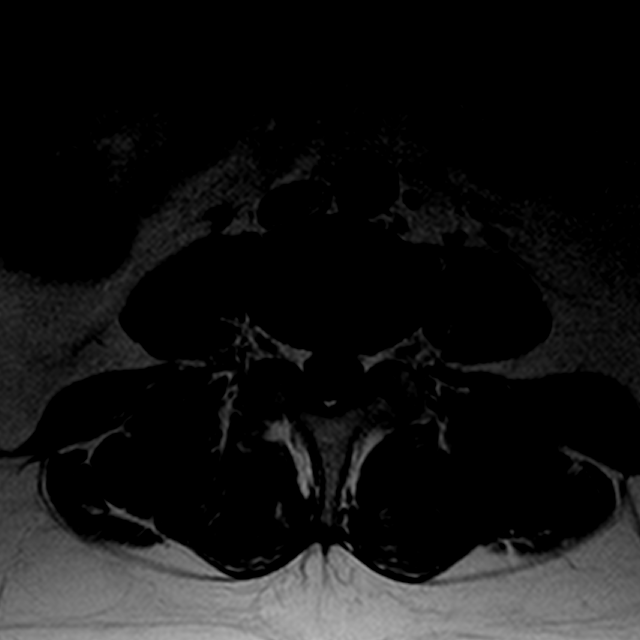
[im 22/25]
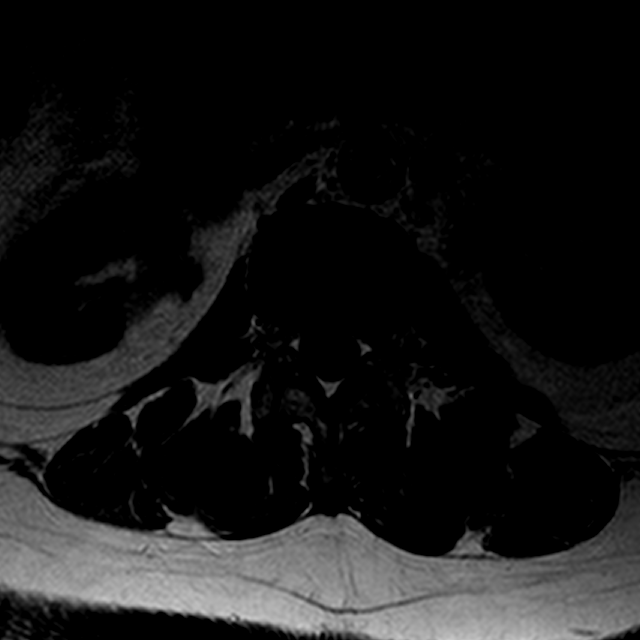

[20 of 48 positions shown; findings below may reference images not displayed]

FINDINGS: Mild intervertebral disc desiccation noted at L4-L5 and L5-S1. Conus
medullaris is at the L1 level. No vertebral malalignment.

Findings that individual levels are as follows:

Unremarkable

L2-L3: Unremarkable

L3-L4: Mild left eccentric disc bulge appears stable. No stenosis.

L4-L5: Broad-based disc bulge is present along with mild facet overgrowth mild
ligamentum flavum redundancy. AP diameter the thecal sac is 10 mm, borderline
for central stenosis. Borderline right foraminal stenosis is noted. There is
mild left and borderline right subarticular lateral recess stenosis.

L5-S1: There is mild left coronal stenosis due to inferior endplate spurring at
L5 extending into the neural foramen. Prominent left lateral intervertebral
spurring may have mass-effect on the left L5 nerve in the lateral extraforaminal
space. Disc bulge is present at this level along with a central annular tear.

IMPRESSION

1. Similar appearance to the prior exam, with mild left foraminal stenosis at
L4-L5 and mild left foraminal stenosis at L5-S1. The prominent intervertebral
spur eccentric to the left L5-S[DATE] have mass-effect on the left L5 spinal
nerve in the lateral extraforaminal space.

## 2009-02-15 ENCOUNTER — Encounter: Payer: Self-pay | Admitting: Family Medicine

## 2009-02-23 ENCOUNTER — Encounter: Payer: Self-pay | Admitting: Family Medicine

## 2009-03-15 DIAGNOSIS — K649 Unspecified hemorrhoids: Secondary | ICD-10-CM

## 2009-03-15 DIAGNOSIS — K7689 Other specified diseases of liver: Secondary | ICD-10-CM | POA: Insufficient documentation

## 2009-03-15 DIAGNOSIS — N200 Calculus of kidney: Secondary | ICD-10-CM | POA: Insufficient documentation

## 2009-03-15 DIAGNOSIS — K219 Gastro-esophageal reflux disease without esophagitis: Secondary | ICD-10-CM | POA: Insufficient documentation

## 2009-03-15 HISTORY — DX: Calculus of kidney: N20.0

## 2009-03-15 HISTORY — DX: Unspecified hemorrhoids: K64.9

## 2009-03-25 ENCOUNTER — Encounter: Payer: Self-pay | Admitting: Family Medicine

## 2009-04-08 ENCOUNTER — Encounter: Payer: Self-pay | Admitting: Family Medicine

## 2009-04-21 ENCOUNTER — Ambulatory Visit: Payer: Self-pay | Admitting: Family Medicine

## 2009-04-21 LAB — CONVERTED CEMR LAB
Bilirubin Urine: NEGATIVE
Glucose, Bld: 96 mg/dL
Glucose, Urine, Semiquant: NEGATIVE
Hgb A1c MFr Bld: 6.6 %
Ketones, urine, test strip: NEGATIVE
Nitrite: NEGATIVE
Protein, U semiquant: 100
Specific Gravity, Urine: 1.015
Urobilinogen, UA: 0.2
pH: 5.5

## 2009-04-23 ENCOUNTER — Encounter: Payer: Self-pay | Admitting: Family Medicine

## 2009-04-30 DIAGNOSIS — N3000 Acute cystitis without hematuria: Secondary | ICD-10-CM | POA: Insufficient documentation

## 2009-05-03 ENCOUNTER — Telehealth: Payer: Self-pay | Admitting: Family Medicine

## 2009-05-09 ENCOUNTER — Telehealth: Payer: Self-pay | Admitting: Family Medicine

## 2009-05-10 ENCOUNTER — Telehealth: Payer: Self-pay | Admitting: Family Medicine

## 2009-05-10 LAB — CONVERTED CEMR LAB
ALT: 9 units/L (ref 0–35)
AST: 11 units/L (ref 0–37)
Albumin: 4.4 g/dL (ref 3.5–5.2)
Alkaline Phosphatase: 91 units/L (ref 39–117)
BUN: 10 mg/dL (ref 6–23)
Bilirubin, Direct: <0.1 mg/dL (ref 0.0–0.3)
CO2: 25 meq/L (ref 19–32)
Calcium: 10.4 mg/dL (ref 8.4–10.5)
Chloride: 100 meq/L (ref 96–112)
Cholesterol: 179 mg/dL (ref 0–200)
Creatinine, Ser: 1.05 mg/dL (ref 0.40–1.20)
Glucose, Bld: 91 mg/dL (ref 70–99)
HDL: 42 mg/dL (ref >39)
LDL Cholesterol: 102 mg/dL — ABNORMAL HIGH (ref 0–99)
Potassium: 5.6 meq/L — ABNORMAL HIGH (ref 3.5–5.3)
Sodium: 138 meq/L (ref 135–145)
Total Bilirubin: 0.1 mg/dL — ABNORMAL LOW (ref 0.3–1.2)
Total CHOL/HDL Ratio: 4.3
Total Protein: 8.3 g/dL (ref 6.0–8.3)
Triglycerides: 177 mg/dL — ABNORMAL HIGH (ref <150)
VLDL: 35 mg/dL (ref 0–40)

## 2009-05-11 ENCOUNTER — Ambulatory Visit: Payer: Self-pay | Admitting: Gastroenterology

## 2009-05-11 DIAGNOSIS — D126 Benign neoplasm of colon, unspecified: Secondary | ICD-10-CM | POA: Insufficient documentation

## 2009-05-17 ENCOUNTER — Ambulatory Visit: Payer: Self-pay | Admitting: Orthopedic Surgery

## 2009-05-17 DIAGNOSIS — M79609 Pain in unspecified limb: Secondary | ICD-10-CM | POA: Insufficient documentation

## 2009-05-30 ENCOUNTER — Telehealth: Payer: Self-pay | Admitting: Family Medicine

## 2009-05-31 ENCOUNTER — Encounter: Payer: Self-pay | Admitting: Family Medicine

## 2009-06-30 ENCOUNTER — Telehealth: Payer: Self-pay | Admitting: Family Medicine

## 2009-07-04 ENCOUNTER — Telehealth: Payer: Self-pay | Admitting: Family Medicine

## 2009-07-04 ENCOUNTER — Ambulatory Visit: Payer: Self-pay | Admitting: Family Medicine

## 2009-07-04 LAB — CONVERTED CEMR LAB
Bilirubin Urine: NEGATIVE
Blood in Urine, dipstick: NEGATIVE
Glucose, Urine, Semiquant: NEGATIVE
Ketones, urine, test strip: NEGATIVE
Nitrite: NEGATIVE
Protein, U semiquant: 100
Specific Gravity, Urine: 1.025
Urobilinogen, UA: 0.2
pH: 5.5

## 2009-07-06 ENCOUNTER — Encounter: Payer: Self-pay | Admitting: Family Medicine

## 2009-08-01 ENCOUNTER — Telehealth: Payer: Self-pay | Admitting: Family Medicine

## 2009-08-03 ENCOUNTER — Ambulatory Visit: Payer: Self-pay | Admitting: Family Medicine

## 2009-08-03 ENCOUNTER — Telehealth: Payer: Self-pay | Admitting: Family Medicine

## 2009-08-03 DIAGNOSIS — D1739 Benign lipomatous neoplasm of skin and subcutaneous tissue of other sites: Secondary | ICD-10-CM | POA: Insufficient documentation

## 2009-08-03 DIAGNOSIS — R5381 Other malaise: Secondary | ICD-10-CM | POA: Insufficient documentation

## 2009-08-03 DIAGNOSIS — R5383 Other fatigue: Secondary | ICD-10-CM

## 2009-08-03 LAB — CONVERTED CEMR LAB
Bilirubin Urine: NEGATIVE
Glucose, Urine, Semiquant: NEGATIVE
Ketones, urine, test strip: NEGATIVE
Nitrite: NEGATIVE
Protein, U semiquant: >=300
Specific Gravity, Urine: 1.02
Urobilinogen, UA: 0.2
pH: 5.5

## 2009-08-04 ENCOUNTER — Encounter: Payer: Self-pay | Admitting: Family Medicine

## 2009-08-05 LAB — CONVERTED CEMR LAB
Creatinine, Urine: 97.7 mg/dL
Microalb Creat Ratio: 380.6 mg/g — ABNORMAL HIGH (ref 0.0–30.0)
Microalb, Ur: 37.18 mg/dL — ABNORMAL HIGH (ref 0.00–1.89)

## 2009-08-10 ENCOUNTER — Telehealth: Payer: Self-pay | Admitting: Family Medicine

## 2009-08-16 ENCOUNTER — Encounter (HOSPITAL_COMMUNITY): Admission: RE | Admit: 2009-08-16 | Discharge: 2009-09-15 | Payer: Self-pay | Admitting: Family Medicine

## 2009-08-16 ENCOUNTER — Encounter: Payer: Self-pay | Admitting: Family Medicine

## 2009-08-26 LAB — CONVERTED CEMR LAB
BUN: 14 mg/dL (ref 6–23)
CO2: 26 meq/L (ref 19–32)
Calcium: 9.6 mg/dL (ref 8.4–10.5)
Chloride: 103 meq/L (ref 96–112)
Cholesterol: 141 mg/dL (ref 0–200)
Creatinine, Ser: 1.05 mg/dL (ref 0.40–1.20)
Glucose, Bld: 87 mg/dL (ref 70–99)
HCT: 36.1 % (ref 36.0–46.0)
HDL: 45 mg/dL (ref >39)
Hemoglobin: 11.3 g/dL — ABNORMAL LOW (ref 12.0–15.0)
Hgb A1c MFr Bld: 6.3 % — ABNORMAL HIGH (ref 4.6–6.1)
LDL Cholesterol: 64 mg/dL (ref 0–99)
MCHC: 31.3 g/dL (ref 30.0–36.0)
MCV: 86.6 fL (ref 78.0–100.0)
Platelets: 440 10*3/uL — ABNORMAL HIGH (ref 150–400)
Potassium: 4.9 meq/L (ref 3.5–5.3)
RBC: 4.17 M/uL (ref 3.87–5.11)
RDW: 17.4 % — ABNORMAL HIGH (ref 11.5–15.5)
Sodium: 141 meq/L (ref 135–145)
TSH: 1.512 microintl units/mL (ref 0.350–4.500)
Total CHOL/HDL Ratio: 3.1
Triglycerides: 158 mg/dL — ABNORMAL HIGH (ref <150)
VLDL: 32 mg/dL (ref 0–40)
Vit D, 25-Hydroxy: 21 ng/mL — ABNORMAL LOW (ref 30–89)
WBC: 5.9 10*3/uL (ref 4.0–10.5)

## 2009-08-31 ENCOUNTER — Emergency Department (HOSPITAL_COMMUNITY): Admission: EM | Admit: 2009-08-31 | Discharge: 2009-08-31 | Payer: Self-pay | Admitting: Emergency Medicine

## 2009-09-02 ENCOUNTER — Encounter: Payer: Self-pay | Admitting: Family Medicine

## 2009-09-08 ENCOUNTER — Ambulatory Visit: Payer: Self-pay | Admitting: Family Medicine

## 2009-09-08 DIAGNOSIS — R140 Abdominal distension (gaseous): Secondary | ICD-10-CM | POA: Insufficient documentation

## 2009-09-08 DIAGNOSIS — R112 Nausea with vomiting, unspecified: Secondary | ICD-10-CM | POA: Insufficient documentation

## 2009-09-08 DIAGNOSIS — D539 Nutritional anemia, unspecified: Secondary | ICD-10-CM | POA: Insufficient documentation

## 2009-09-08 DIAGNOSIS — K3189 Other diseases of stomach and duodenum: Secondary | ICD-10-CM | POA: Insufficient documentation

## 2009-09-08 DIAGNOSIS — R1013 Epigastric pain: Secondary | ICD-10-CM

## 2009-09-08 LAB — CONVERTED CEMR LAB
Bilirubin Urine: NEGATIVE
Glucose, Urine, Semiquant: NEGATIVE
Ketones, urine, test strip: NEGATIVE
Nitrite: NEGATIVE
Protein, U semiquant: 100
Specific Gravity, Urine: 1.015
Urobilinogen, UA: 0.2
pH: 5.5

## 2009-09-09 ENCOUNTER — Encounter: Payer: Self-pay | Admitting: Family Medicine

## 2009-09-12 LAB — CONVERTED CEMR LAB: Retic Ct Pct: 0.9 % (ref 0.4–3.1)

## 2009-09-14 ENCOUNTER — Encounter: Payer: Self-pay | Admitting: Family Medicine

## 2009-09-19 ENCOUNTER — Ambulatory Visit (HOSPITAL_COMMUNITY): Admission: RE | Admit: 2009-09-19 | Discharge: 2009-09-19 | Payer: Self-pay | Admitting: Family Medicine

## 2009-09-21 ENCOUNTER — Encounter: Payer: Self-pay | Admitting: Family Medicine

## 2009-09-26 ENCOUNTER — Telehealth: Payer: Self-pay | Admitting: Family Medicine

## 2009-09-27 ENCOUNTER — Encounter: Payer: Self-pay | Admitting: Family Medicine

## 2009-09-27 ENCOUNTER — Encounter (HOSPITAL_COMMUNITY): Admission: RE | Admit: 2009-09-27 | Discharge: 2009-10-27 | Payer: Self-pay | Admitting: Family Medicine

## 2009-10-11 ENCOUNTER — Ambulatory Visit: Payer: Self-pay | Admitting: Family Medicine

## 2009-10-12 ENCOUNTER — Encounter: Payer: Self-pay | Admitting: Physician Assistant

## 2009-10-28 ENCOUNTER — Telehealth: Payer: Self-pay | Admitting: Family Medicine

## 2009-10-31 ENCOUNTER — Ambulatory Visit: Payer: Self-pay | Admitting: Family Medicine

## 2009-11-01 ENCOUNTER — Encounter: Payer: Self-pay | Admitting: Family Medicine

## 2009-11-21 ENCOUNTER — Telehealth: Payer: Self-pay | Admitting: Family Medicine

## 2009-12-02 ENCOUNTER — Encounter (INDEPENDENT_AMBULATORY_CARE_PROVIDER_SITE_OTHER): Payer: Self-pay | Admitting: *Deleted

## 2009-12-09 ENCOUNTER — Ambulatory Visit (HOSPITAL_COMMUNITY): Admission: RE | Admit: 2009-12-09 | Discharge: 2009-12-09 | Payer: Self-pay | Admitting: Urology

## 2009-12-15 ENCOUNTER — Ambulatory Visit (HOSPITAL_COMMUNITY): Admission: RE | Admit: 2009-12-15 | Discharge: 2009-12-15 | Payer: Self-pay | Admitting: Urology

## 2009-12-20 ENCOUNTER — Telehealth: Payer: Self-pay | Admitting: Family Medicine

## 2009-12-27 ENCOUNTER — Encounter: Payer: Self-pay | Admitting: Family Medicine

## 2010-01-04 ENCOUNTER — Ambulatory Visit (HOSPITAL_COMMUNITY)
Admission: RE | Admit: 2010-01-04 | Discharge: 2010-01-04 | Payer: Self-pay | Source: Home / Self Care | Admitting: Urology

## 2010-01-05 ENCOUNTER — Emergency Department (HOSPITAL_COMMUNITY): Admission: EM | Admit: 2010-01-05 | Discharge: 2010-01-05 | Payer: Self-pay | Admitting: Emergency Medicine

## 2010-01-06 ENCOUNTER — Inpatient Hospital Stay (HOSPITAL_COMMUNITY): Admission: EM | Admit: 2010-01-06 | Discharge: 2010-01-11 | Payer: Self-pay | Admitting: Emergency Medicine

## 2010-01-10 HISTORY — PX: OTHER SURGICAL HISTORY: SHX169

## 2010-01-11 ENCOUNTER — Encounter: Payer: Self-pay | Admitting: Family Medicine

## 2010-01-13 ENCOUNTER — Telehealth: Payer: Self-pay | Admitting: Family Medicine

## 2010-01-18 ENCOUNTER — Encounter: Payer: Self-pay | Admitting: Family Medicine

## 2010-01-18 ENCOUNTER — Ambulatory Visit (HOSPITAL_COMMUNITY): Admission: RE | Admit: 2010-01-18 | Discharge: 2010-01-18 | Payer: Self-pay | Admitting: Urology

## 2010-01-19 ENCOUNTER — Telehealth: Payer: Self-pay | Admitting: Family Medicine

## 2010-01-19 ENCOUNTER — Encounter: Payer: Self-pay | Admitting: Family Medicine

## 2010-01-20 ENCOUNTER — Encounter: Payer: Self-pay | Admitting: Family Medicine

## 2010-01-23 ENCOUNTER — Encounter: Payer: Self-pay | Admitting: Family Medicine

## 2010-01-24 ENCOUNTER — Ambulatory Visit: Payer: Self-pay | Admitting: Family Medicine

## 2010-01-24 DIAGNOSIS — R42 Dizziness and giddiness: Secondary | ICD-10-CM | POA: Insufficient documentation

## 2010-01-24 DIAGNOSIS — N12 Tubulo-interstitial nephritis, not specified as acute or chronic: Secondary | ICD-10-CM | POA: Insufficient documentation

## 2010-01-25 ENCOUNTER — Ambulatory Visit: Payer: Self-pay | Admitting: Family Medicine

## 2010-01-25 LAB — CONVERTED CEMR LAB
BUN: 14 mg/dL (ref 6–23)
Basophils Absolute: 0 10*3/uL (ref 0.0–0.1)
Basophils Relative: 1 % (ref 0–1)
CO2: 23 meq/L (ref 19–32)
Calcium: 10.3 mg/dL (ref 8.4–10.5)
Chloride: 101 meq/L (ref 96–112)
Creatinine, Ser: 1.24 mg/dL — ABNORMAL HIGH (ref 0.40–1.20)
Eosinophils Absolute: 0.1 10*3/uL (ref 0.0–0.7)
Eosinophils Relative: 1 % (ref 0–5)
Glucose, Bld: 81 mg/dL (ref 70–99)
HCT: 40.4 % (ref 36.0–46.0)
Hemoglobin: 13.2 g/dL (ref 12.0–15.0)
Hgb A1c MFr Bld: 6.8 % — ABNORMAL HIGH (ref <5.7)
Lymphocytes Relative: 40 % (ref 12–46)
Lymphs Abs: 1.6 10*3/uL (ref 0.7–4.0)
MCHC: 32.7 g/dL (ref 30.0–36.0)
MCV: 96.7 fL (ref 78.0–100.0)
Monocytes Absolute: 0.3 10*3/uL (ref 0.1–1.0)
Monocytes Relative: 7 % (ref 3–12)
Neutro Abs: 2 10*3/uL (ref 1.7–7.7)
Neutrophils Relative %: 51 % (ref 43–77)
Platelets: 310 10*3/uL (ref 150–400)
Potassium: 4.4 meq/L (ref 3.5–5.3)
RBC: 4.18 M/uL (ref 3.87–5.11)
RDW: 13.1 % (ref 11.5–15.5)
Sodium: 138 meq/L (ref 135–145)
WBC: 4 10*3/uL (ref 4.0–10.5)

## 2010-01-26 ENCOUNTER — Encounter: Payer: Self-pay | Admitting: Family Medicine

## 2010-01-27 ENCOUNTER — Encounter: Payer: Self-pay | Admitting: Family Medicine

## 2010-02-07 ENCOUNTER — Ambulatory Visit: Payer: Self-pay | Admitting: Family Medicine

## 2010-02-07 LAB — CONVERTED CEMR LAB
Bilirubin Urine: NEGATIVE
Glucose, Urine, Semiquant: NEGATIVE
Ketones, urine, test strip: NEGATIVE
Nitrite: NEGATIVE
Protein, U semiquant: 100
Specific Gravity, Urine: 1.025
Urobilinogen, UA: 0.2
pH: 5.5

## 2010-02-08 ENCOUNTER — Encounter: Payer: Self-pay | Admitting: Family Medicine

## 2010-02-25 ENCOUNTER — Encounter: Payer: Self-pay | Admitting: Family Medicine

## 2010-02-27 ENCOUNTER — Encounter: Payer: Self-pay | Admitting: Family Medicine

## 2010-02-28 ENCOUNTER — Ambulatory Visit (HOSPITAL_COMMUNITY): Admission: RE | Admit: 2010-02-28 | Discharge: 2010-02-28 | Payer: Self-pay | Admitting: Family Medicine

## 2010-03-08 ENCOUNTER — Encounter: Payer: Self-pay | Admitting: Family Medicine

## 2010-03-14 ENCOUNTER — Telehealth: Payer: Self-pay | Admitting: Family Medicine

## 2010-03-15 ENCOUNTER — Telehealth: Payer: Self-pay | Admitting: Family Medicine

## 2010-03-15 ENCOUNTER — Encounter: Payer: Self-pay | Admitting: Family Medicine

## 2010-03-20 ENCOUNTER — Ambulatory Visit: Payer: Self-pay | Admitting: Family Medicine

## 2010-03-29 ENCOUNTER — Encounter: Payer: Self-pay | Admitting: Family Medicine

## 2010-03-30 ENCOUNTER — Encounter: Payer: Self-pay | Admitting: Family Medicine

## 2010-03-30 ENCOUNTER — Telehealth: Payer: Self-pay | Admitting: Family Medicine

## 2010-05-11 HISTORY — PX: PERCUTANEOUS NEPHROSTOLITHOTOMY: SHX2207

## 2010-05-16 ENCOUNTER — Encounter: Payer: Self-pay | Admitting: Family Medicine

## 2010-05-17 ENCOUNTER — Encounter: Payer: Self-pay | Admitting: Family Medicine

## 2010-05-19 ENCOUNTER — Encounter: Payer: Self-pay | Admitting: Family Medicine

## 2010-05-23 ENCOUNTER — Ambulatory Visit: Payer: Self-pay | Admitting: Family Medicine

## 2010-05-23 LAB — HM DIABETES FOOT EXAM

## 2010-05-26 IMAGING — CR DG CHEST 2V
2 series · 2 of 2 positions shown · non-contrast
Comparison: Portable chest x-ray from earlier today.

CLINICAL DATA: Fever and cough.

CHEST - 2 VIEW

[view not recorded (1 of 2)]
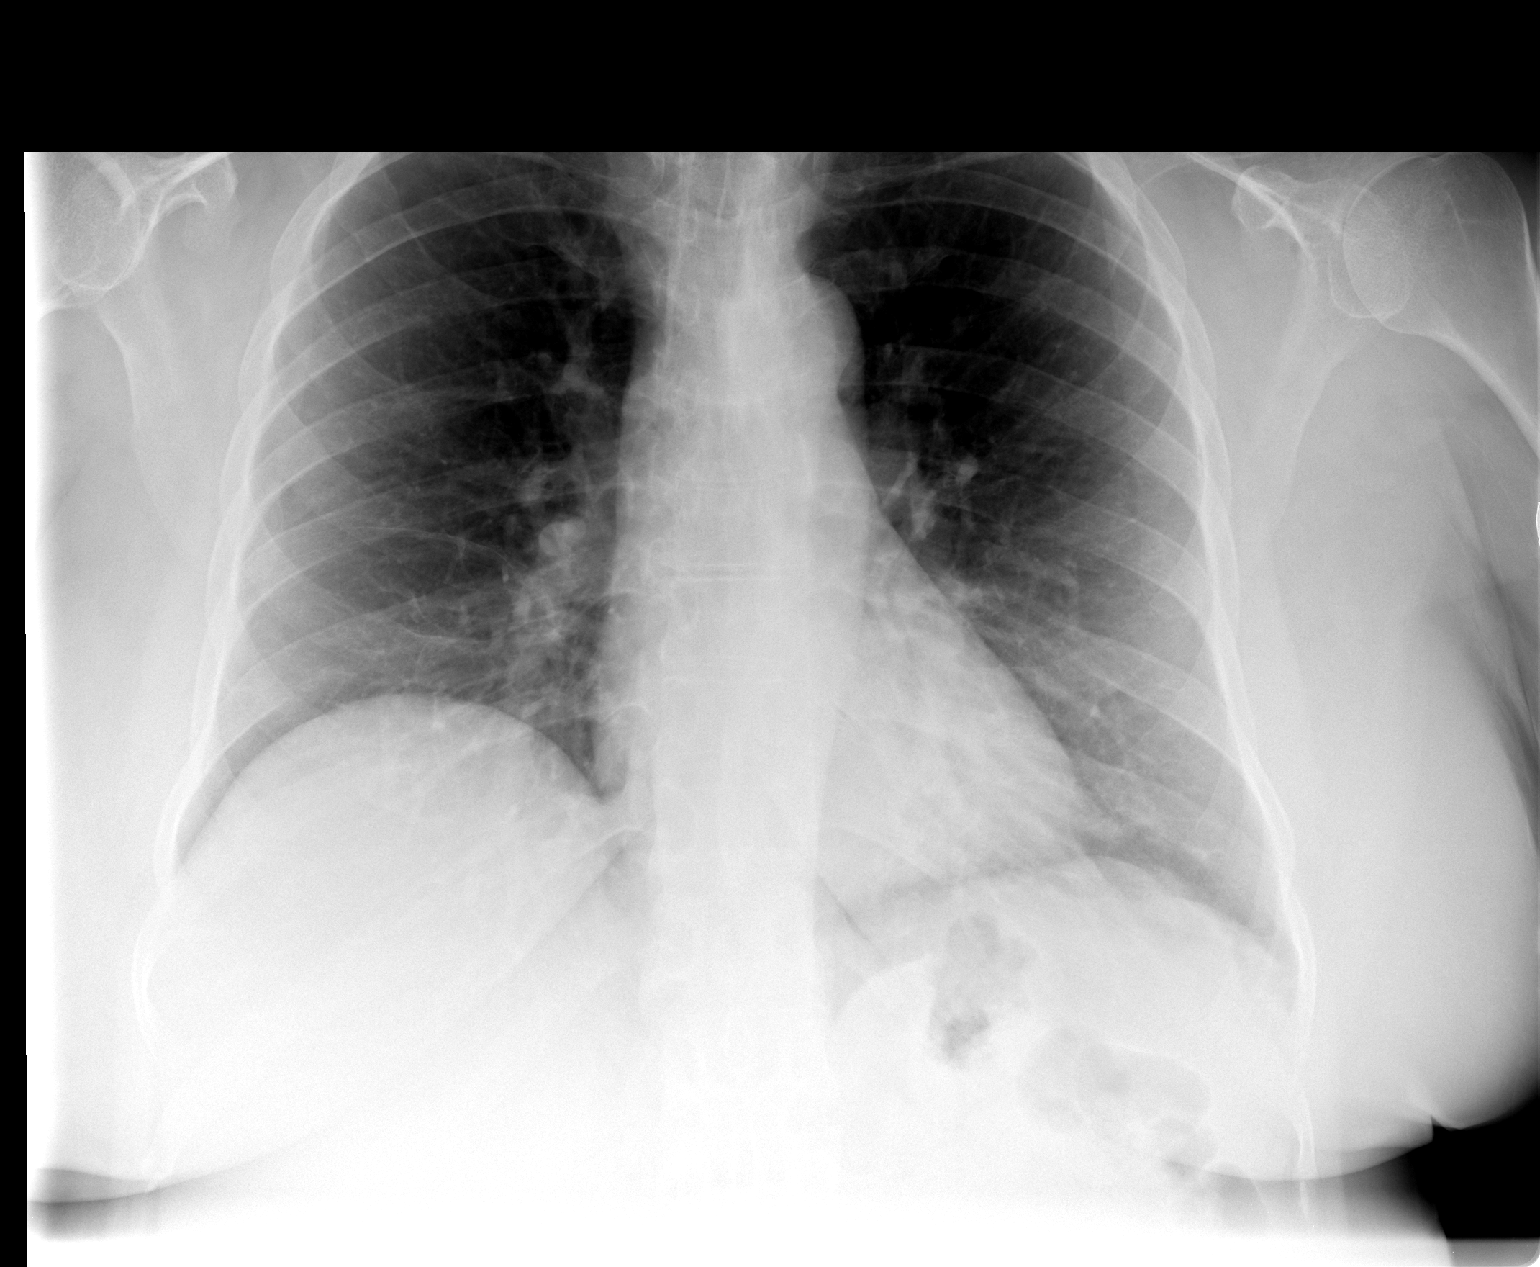

[view not recorded (2 of 2)]
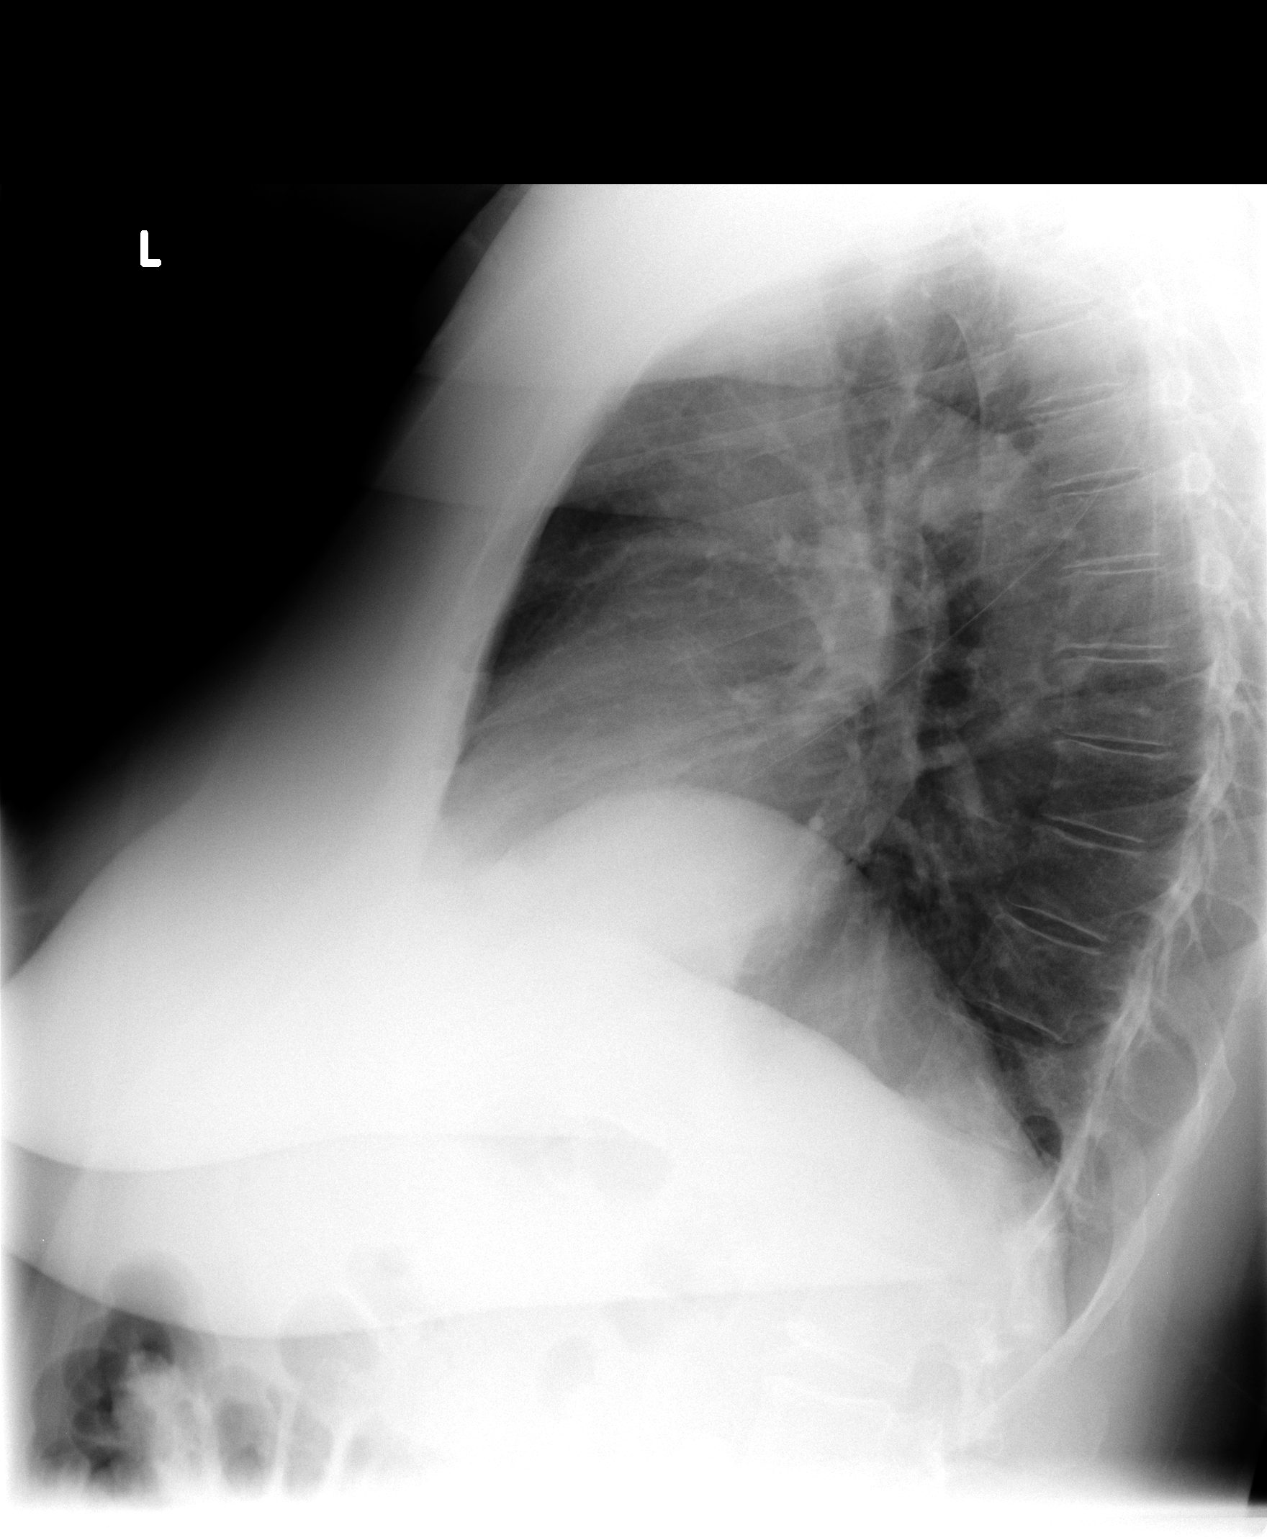

[2 of 2 positions shown; findings below may reference images not displayed]

FINDINGS: Two-view exam shows persistent retrocardiac opacity,
consistent with atelectasis or infiltrate.  Asymmetric elevation of
the right hemidiaphragm is stable.  There is mild diffuse
interstitial coarsening.  Heart size is normal.  Bony structures of
the visualized thorax are intact.
IMPRESSION: Persistent retrocardiac opacity, compatible with atelectasis or
pneumonia.

## 2010-05-26 IMAGING — CR DG CHEST 1V PORT
1 series · 1 of 1 positions shown · non-contrast
Comparison: Chest radiograph 08/29/2006

CLINICAL DATA: Fever, cough

PORTABLE CHEST - 1 VIEW

[view not recorded]
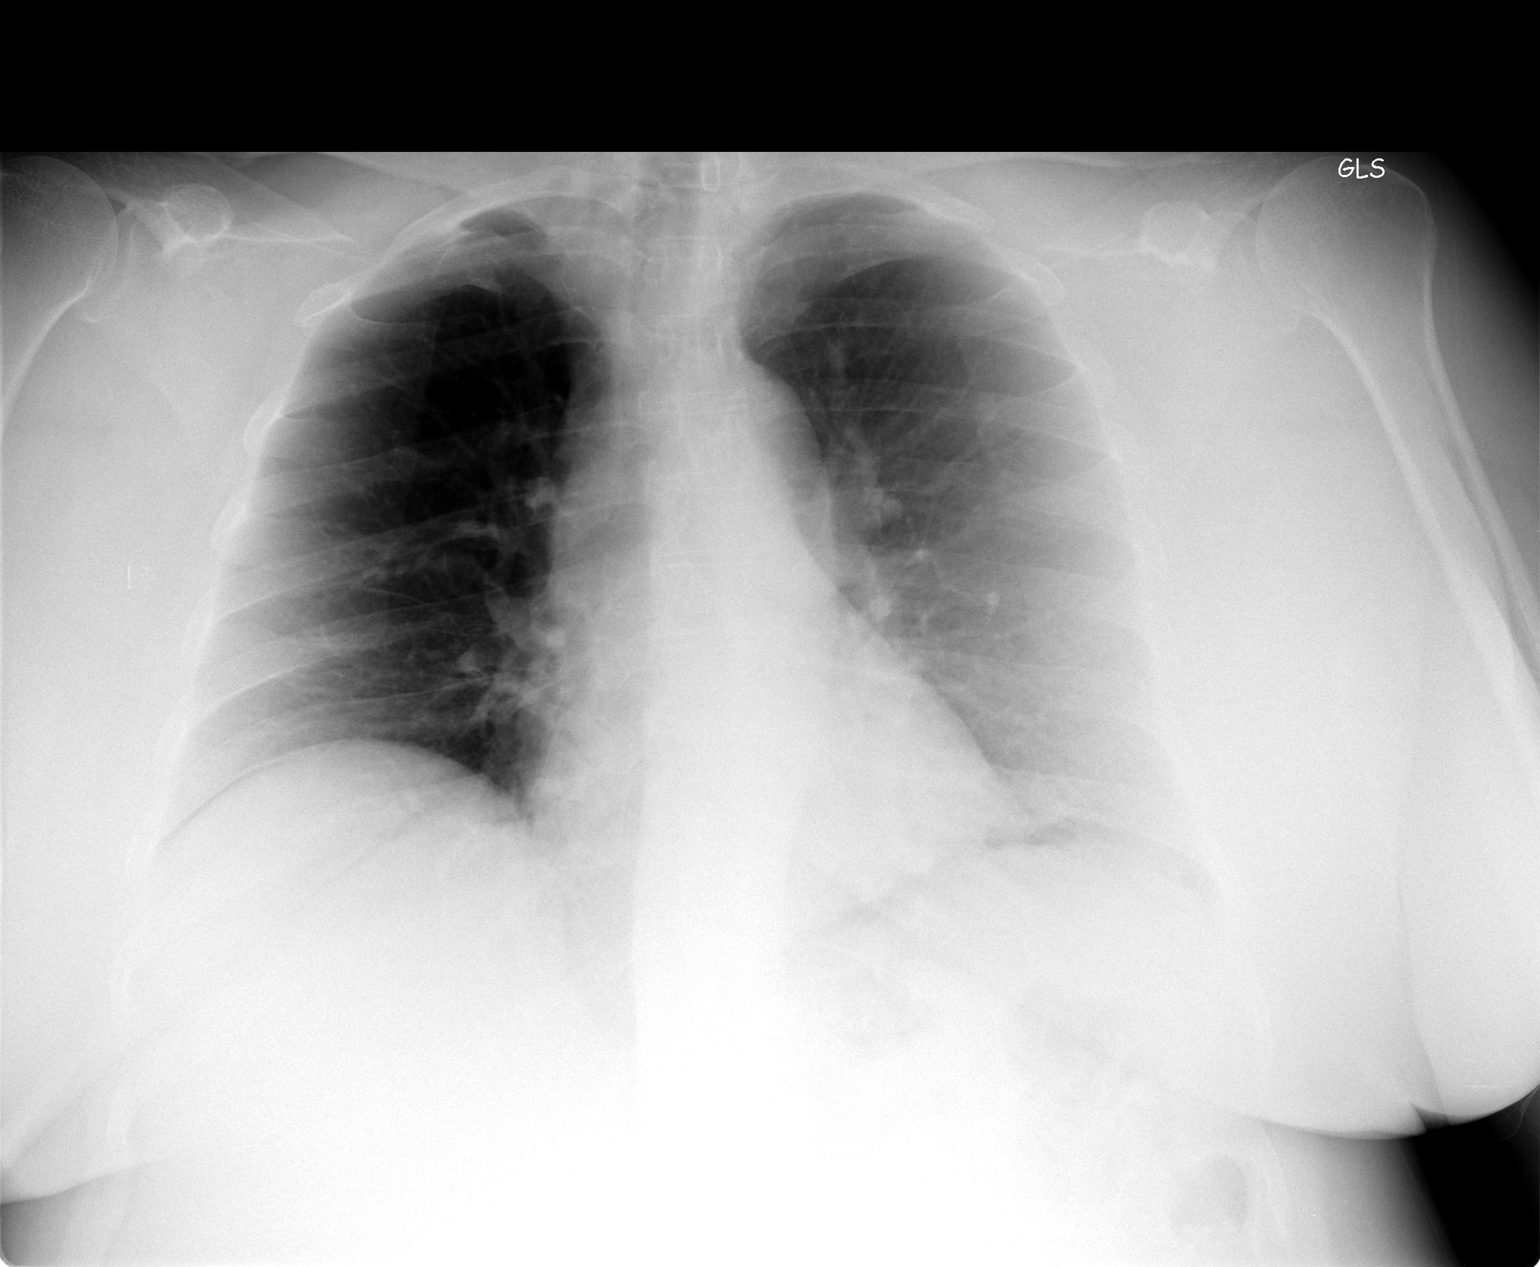

[1 of 1 positions shown; findings below may reference images not displayed]

FINDINGS: Normal mediastinum and cardiac silhouette.  Costophrenic
angles are clear.  Left retrocardiac opacity.  No evidence of
pulmonary edema.  No pneumothorax.
IMPRESSION: 1.  Potential left retrocardiac opacity representing atelectasis or
infiltrate.  Consider PA and lateral.

## 2010-05-28 LAB — CONVERTED CEMR LAB
ALT: 9 units/L (ref 0–35)
AST: 12 units/L (ref 0–37)
Albumin: 4.5 g/dL (ref 3.5–5.2)
Alkaline Phosphatase: 92 units/L (ref 39–117)
BUN: 16 mg/dL (ref 6–23)
Bilirubin, Direct: 0.1 mg/dL (ref 0.0–0.3)
CO2: 26 meq/L (ref 19–32)
Calcium: 10.2 mg/dL (ref 8.4–10.5)
Chloride: 103 meq/L (ref 96–112)
Cholesterol: 163 mg/dL (ref 0–200)
Creatinine, Ser: 0.88 mg/dL (ref 0.40–1.20)
Glucose, Bld: 91 mg/dL (ref 70–99)
HDL: 43 mg/dL (ref >39)
Hgb A1c MFr Bld: 6.4 % — ABNORMAL HIGH (ref <5.7)
Indirect Bilirubin: 0.1 mg/dL (ref 0.0–0.9)
LDL Cholesterol: 86 mg/dL (ref 0–99)
Potassium: 4.4 meq/L (ref 3.5–5.3)
Sodium: 140 meq/L (ref 135–145)
Total Bilirubin: 0.2 mg/dL — ABNORMAL LOW (ref 0.3–1.2)
Total CHOL/HDL Ratio: 3.8
Total Protein: 8.5 g/dL — ABNORMAL HIGH (ref 6.0–8.3)
Triglycerides: 172 mg/dL — ABNORMAL HIGH (ref <150)
VLDL: 34 mg/dL (ref 0–40)

## 2010-06-08 ENCOUNTER — Emergency Department (HOSPITAL_COMMUNITY)
Admission: EM | Admit: 2010-06-08 | Discharge: 2010-06-08 | Payer: Self-pay | Source: Home / Self Care | Admitting: Emergency Medicine

## 2010-06-08 ENCOUNTER — Telehealth (INDEPENDENT_AMBULATORY_CARE_PROVIDER_SITE_OTHER): Payer: Self-pay | Admitting: *Deleted

## 2010-06-17 IMAGING — CR DG ABDOMEN ACUTE W/ 1V CHEST
3 series · 3 of 3 positions shown · non-contrast
Comparison: Chest x-ray 12/15/2007

CLINICAL DATA: Chest pain, abdominal pain

ACUTE ABDOMEN SERIES (ABDOMEN 2 VIEW & CHEST 1 VIEW)

[view not recorded (1 of 3)]
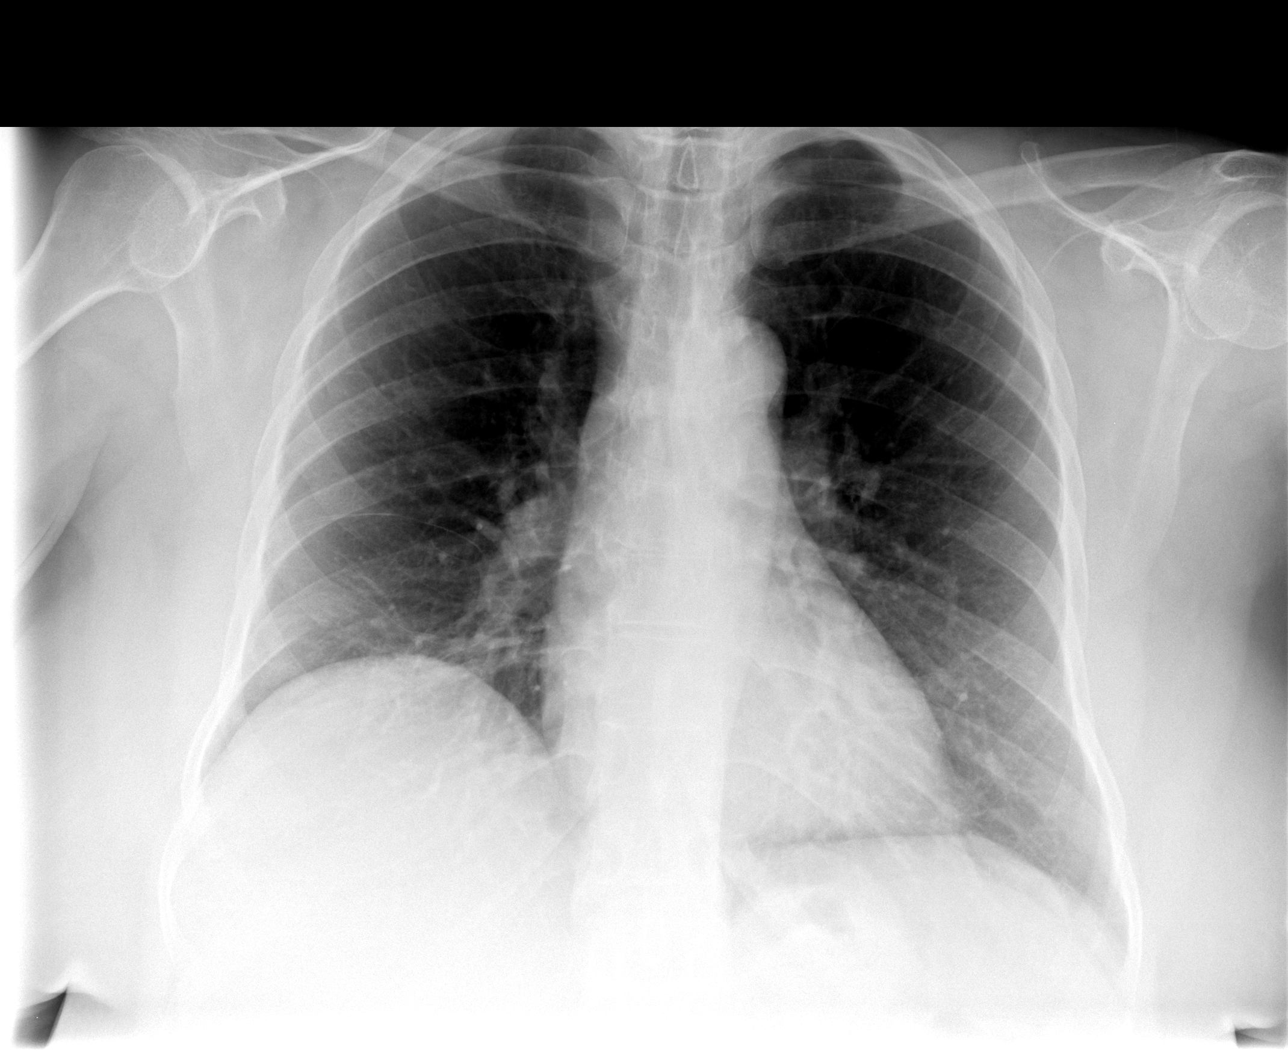

[view not recorded (2 of 3)]
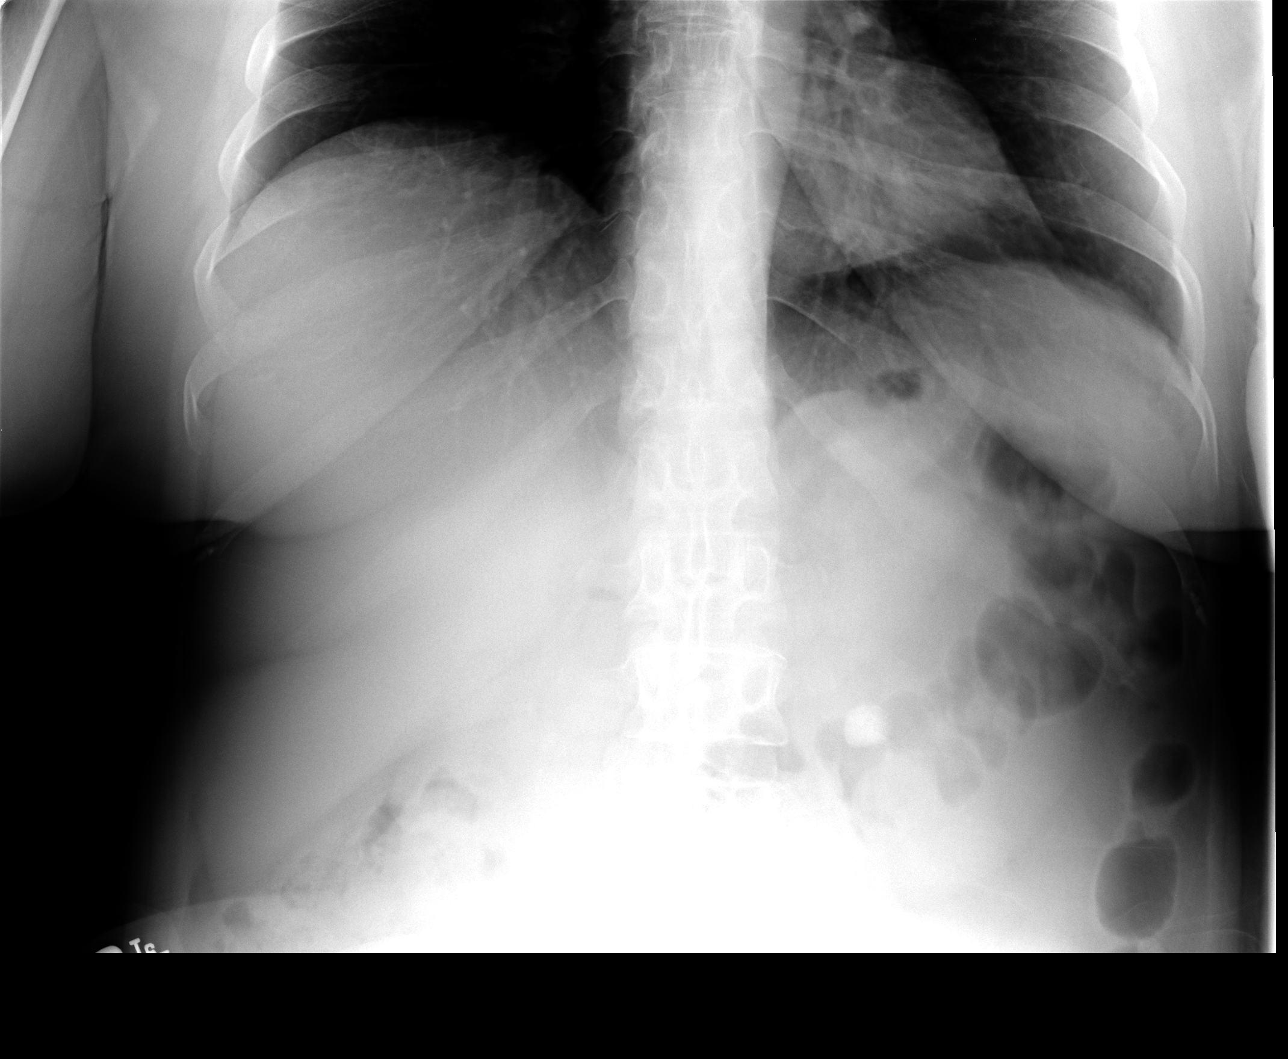

[view not recorded (3 of 3)]
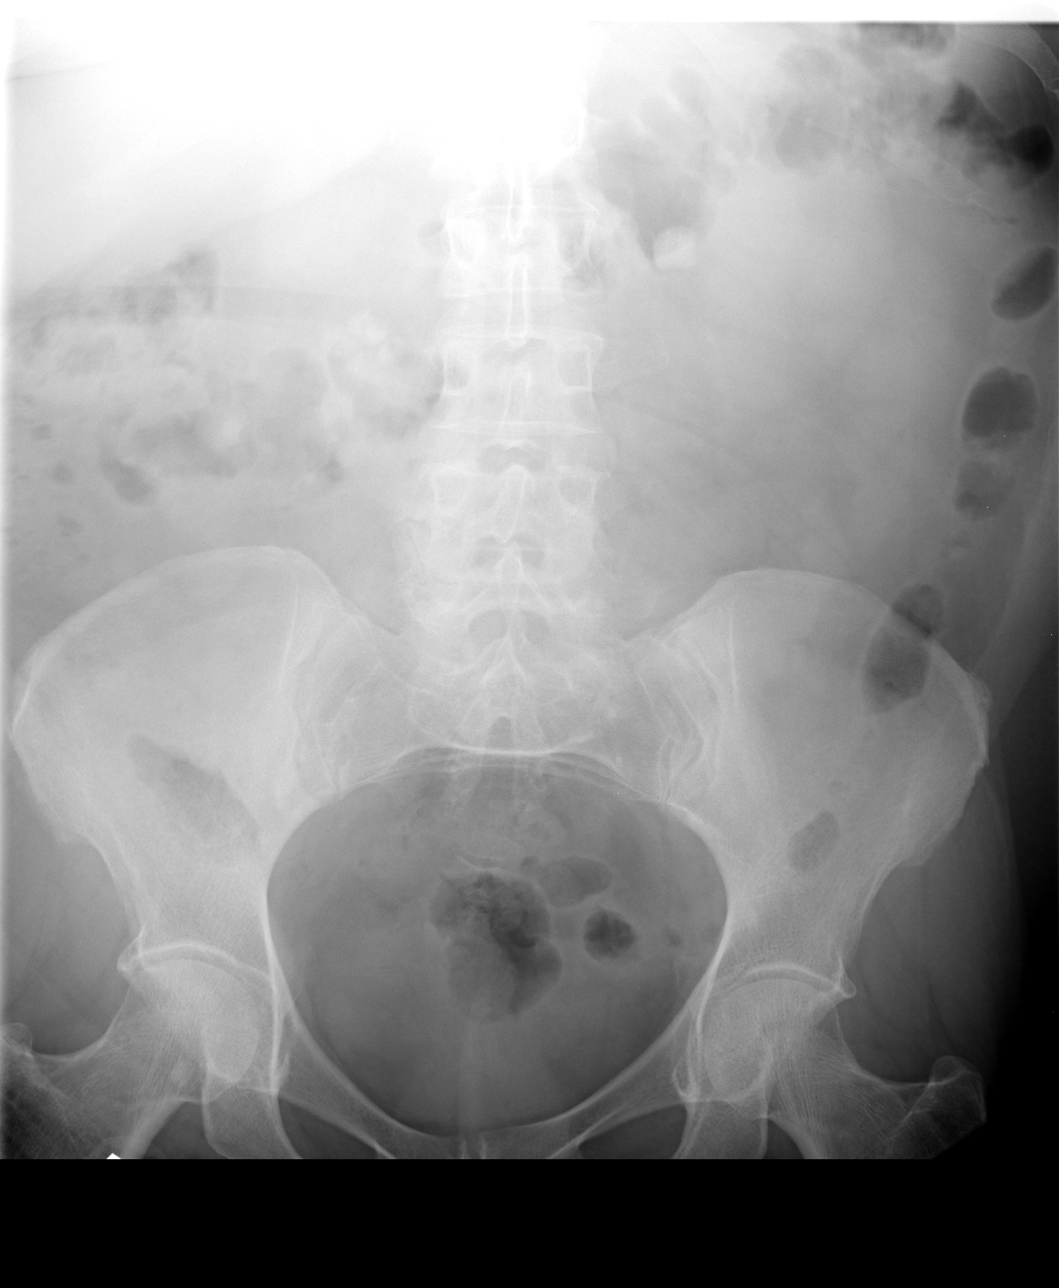

[3 of 3 positions shown; findings below may reference images not displayed]

FINDINGS: Improved left retrocardiac density, decreasing
atelectasis or pneumonia.  Minimal right base atelectasis.  No
effusions.  Heart is normal size.

There is a normal bowel gas pattern.  No free air.  No
organomegaly.  Rounded radiopaque densities seen both to the left
and right of the lumbar spine.  This may be material within the
bowel, stomach or fecal stream.
IMPRESSION: No obstruction or free air.

Radiopaque densities both to the left and right of the lumbar
spine.  Question ingested material.

## 2010-06-22 ENCOUNTER — Ambulatory Visit
Admission: RE | Admit: 2010-06-22 | Discharge: 2010-06-22 | Payer: Self-pay | Source: Home / Self Care | Attending: Family Medicine | Admitting: Family Medicine

## 2010-06-22 DIAGNOSIS — B369 Superficial mycosis, unspecified: Secondary | ICD-10-CM | POA: Insufficient documentation

## 2010-06-22 DIAGNOSIS — L049 Acute lymphadenitis, unspecified: Secondary | ICD-10-CM | POA: Insufficient documentation

## 2010-07-01 ENCOUNTER — Encounter: Payer: Self-pay | Admitting: Neurosurgery

## 2010-07-02 ENCOUNTER — Encounter: Payer: Self-pay | Admitting: Nephrology

## 2010-07-02 ENCOUNTER — Encounter: Payer: Self-pay | Admitting: Family Medicine

## 2010-07-07 IMAGING — CR DG CHEST 2V
2 series · 2 of 2 positions shown · non-contrast
Comparison: 12/15/2007

CLINICAL DATA: Cough, fever, diabetes, hypertension, prior smoker,
history pneumonia

CHEST - 2 VIEW

[view not recorded (1 of 2)]
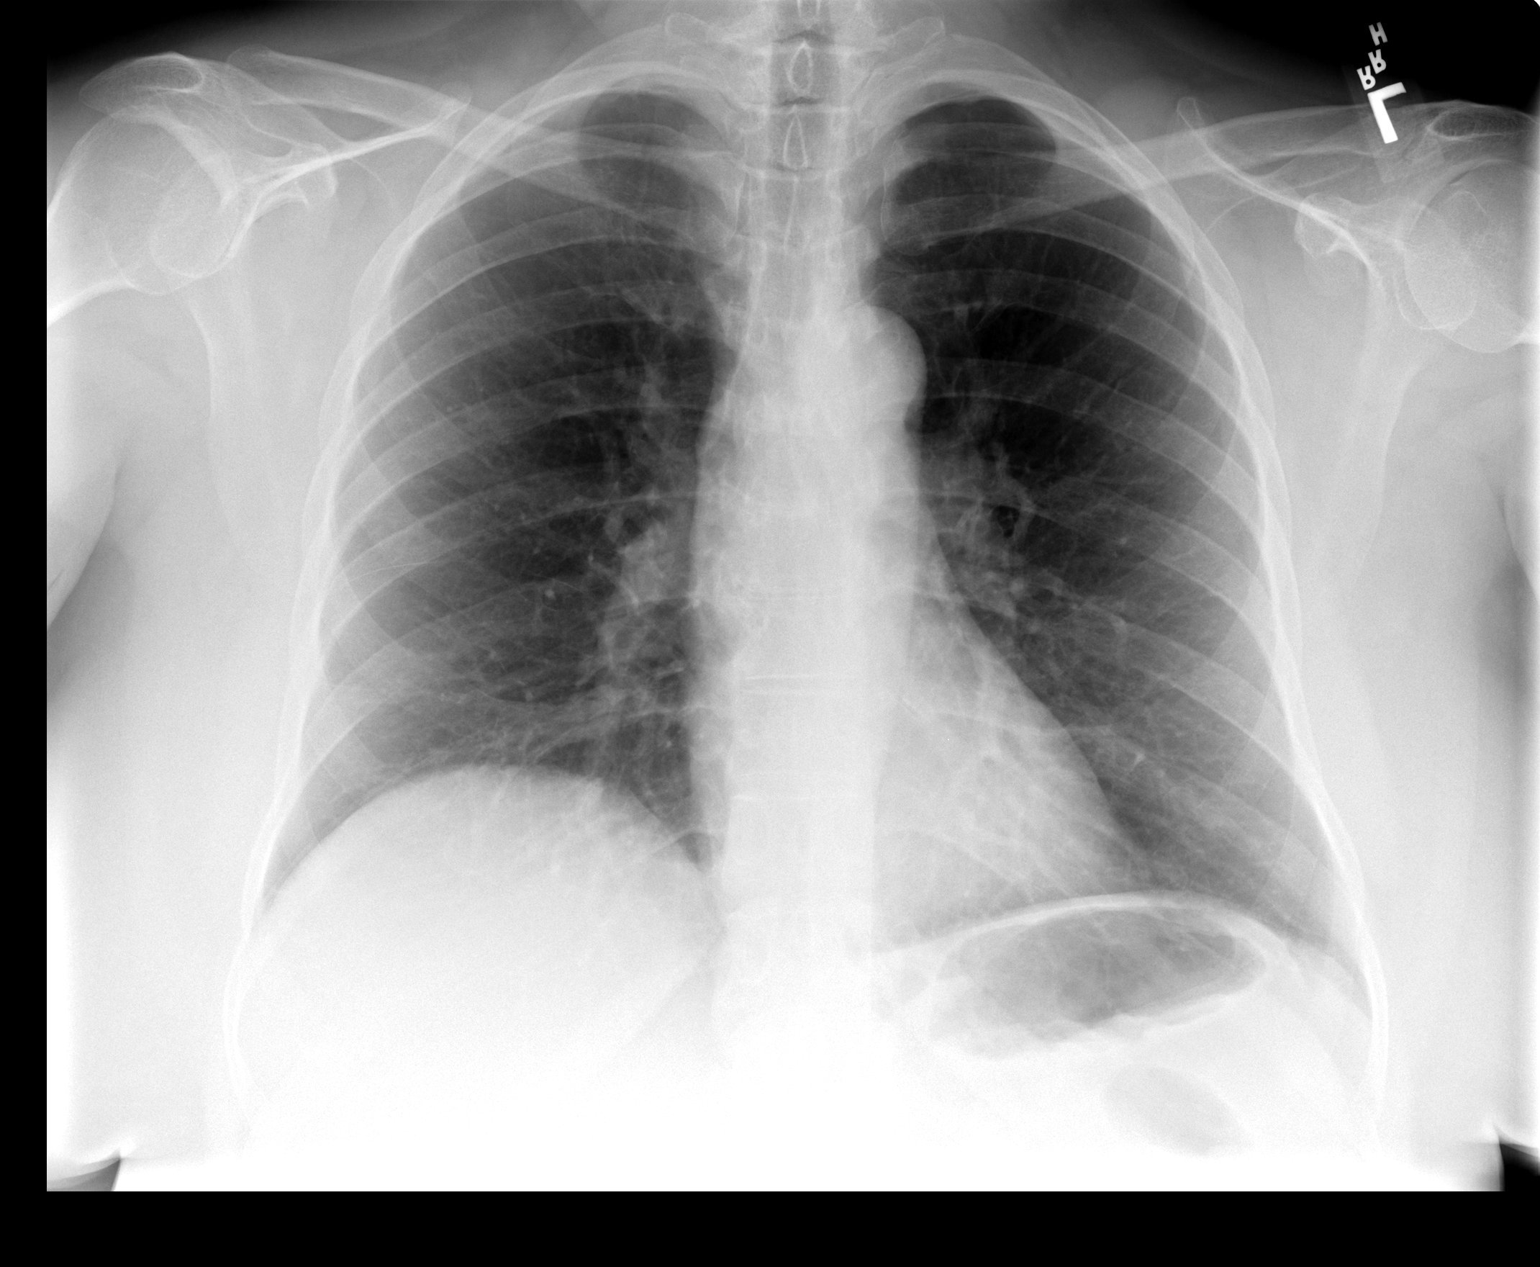

[view not recorded (2 of 2)]
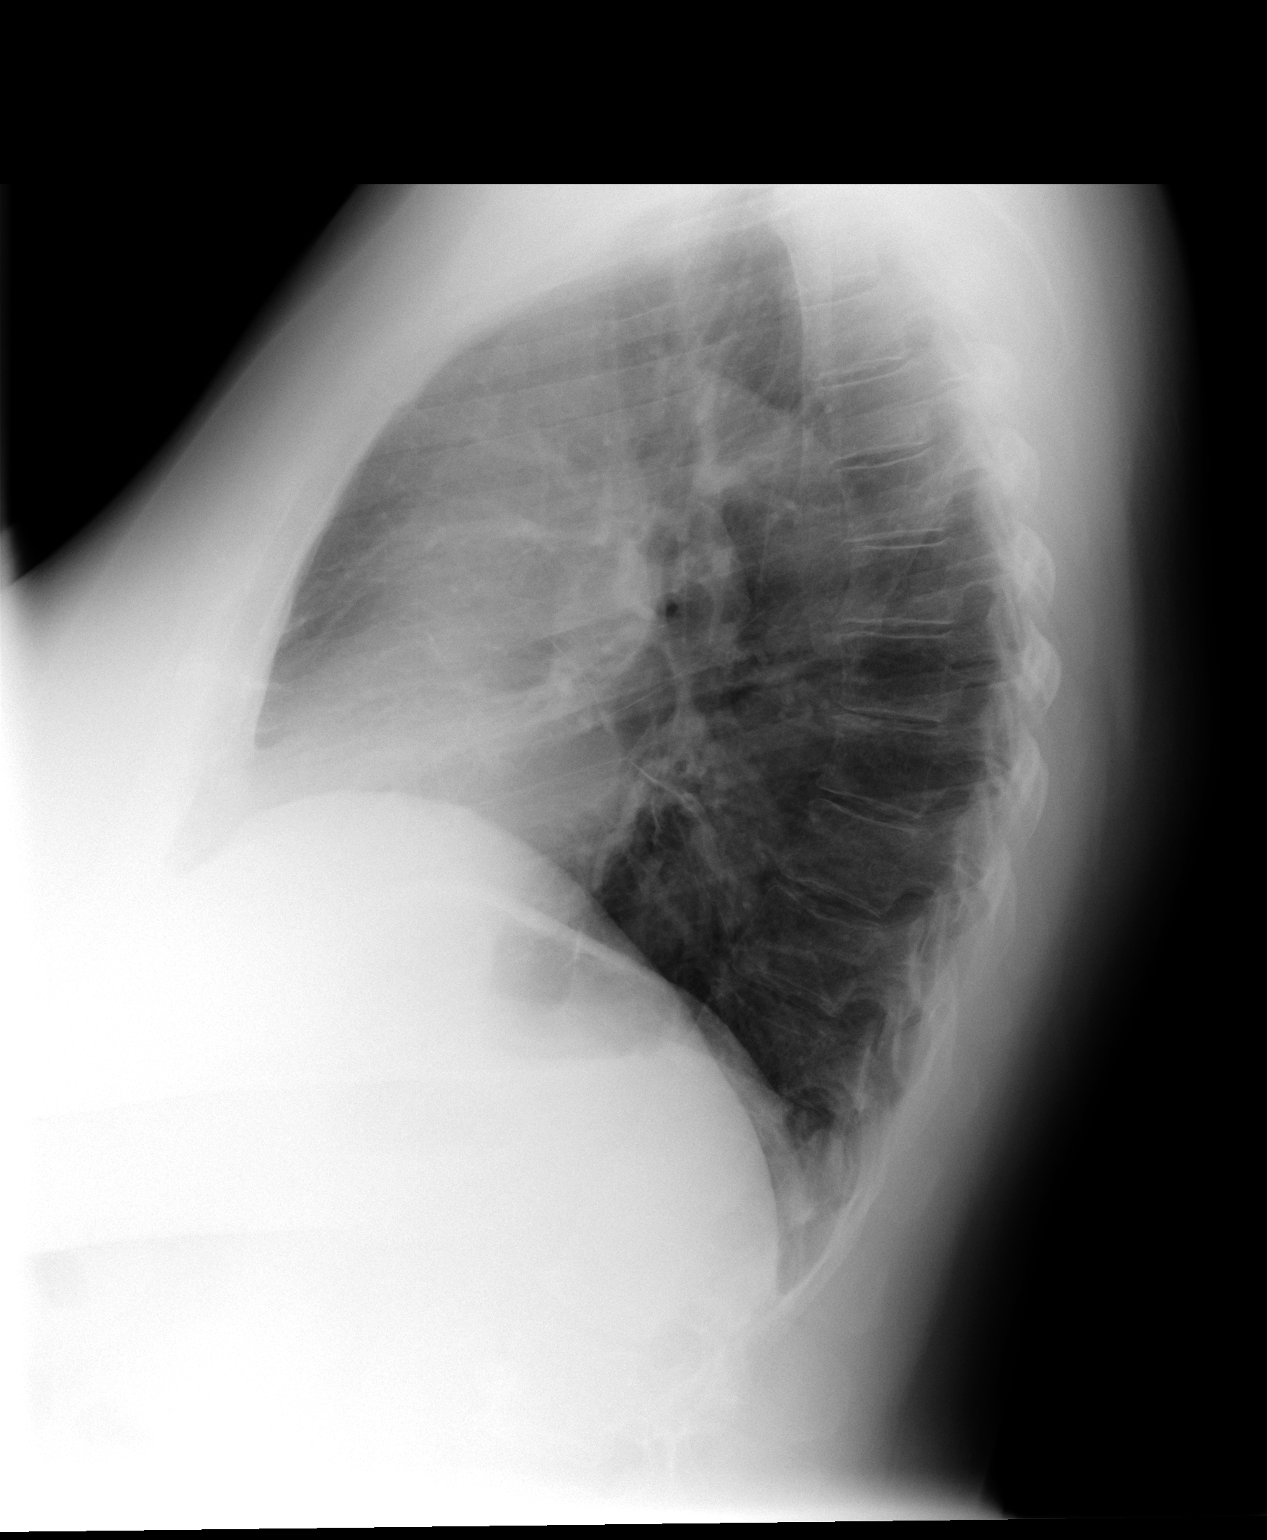

[2 of 2 positions shown; findings below may reference images not displayed]

FINDINGS: Normal heart size, mediastinal contours, and pulmonary vascularity.
Lungs clear.
Slight chronic elevation of right diaphragm stable.
No acute infiltrate or effusion.
Bones unremarkable.
IMPRESSION: No acute abnormalities.

## 2010-07-09 LAB — CONVERTED CEMR LAB
Glucose, Bld: 76 mg/dL
Hgb A1c MFr Bld: 6.5 %

## 2010-07-11 ENCOUNTER — Telehealth: Payer: Self-pay | Admitting: Family Medicine

## 2010-07-11 NOTE — Miscellaneous (Signed)
Summary: refill  Clinical Lists Changes  Medications: Rx of XANAX 0.5 MG  TABS (ALPRAZOLAM) Take 1 tablet by mouth two times a day;  #60 x 3;  Signed;  Entered by: Jimmey Ralph LPN;  Authorized by: Tula Nakayama MD;  Method used: Printed then faxed to [facility]. [phone]*, [address], [place], [place], [place]  [address], Ph: [phone] or [phone], Fax: [phone]    Prescriptions: XANAX 0.5 MG  TABS (ALPRAZOLAM) Take 1 tablet by mouth two times a day  #60 x 3   Entered by:   Jimmey Ralph LPN   Authorized by:   Tula Nakayama MD   Signed by:   Jimmey Ralph LPN on [id number]   Method used:   Printed then faxed [facility] ...       [facility]  [address] [phone]* (retail)       [address]       [place], [place]  [address]       Ph: [phone] or [phone]       Fax: [phone]   RxID:   [id number]

## 2010-07-11 NOTE — Progress Notes (Signed)
Summary: refill  Phone Note Call from Patient   Summary of Call: pt needs potissum and alprazolam 5mg  and tramadol 50mg  [facility] [phone] Initial call taken by: Lenn Cal,  Oct 28, 2009 2:17 PM    Prescriptions: TRAMADOL HCL 50 MG TABS (TRAMADOL HCL) Take 1 tab bid  #60 x 3   Entered by:   Kate Sable LPN   Authorized by:   Tula Nakayama MD   Signed by:   Kate Sable LPN on [id number]   Method used:   Printed then faxed to ...       [facility]  [address] [phone]* (retail)       [address]       [place], [place]  [address]       Ph: [phone] or [phone]       Fax: [phone]   RxID:   [id number] POTASSIUM CITRATE 540 MG  TBCR (POTASSIUM CITRATE) Take 1 tablet by mouth two times a day  #60 Tablet x 3   Entered by:   Kate Sable LPN   Authorized by:   Tula Nakayama MD   Signed by:   Kate Sable LPN on [id number]   Method used:   Printed then faxed to ...       [facility]  [address] [phone]* (retail)       [address]       [place], [place]  [address]       Ph: [phone] or [phone]       Fax: [phone]   RxID:   [id number] Duanne Moron 0.5 MG  TABS (ALPRAZOLAM) Take 1 tablet by mouth two times a day  #30 x 3   Entered by:   Kate Sable LPN   Authorized by:   Tula Nakayama MD   Signed by:   Kate Sable LPN on [id number]   Method used:   Printed then faxed to ...       [facility]  [address] [phone]* (retail)       [address]       [place], [place]  [address]       Ph: [phone] or [phone]       Fax: [phone]   RxID:   [id number]

## 2010-07-11 NOTE — Miscellaneous (Signed)
Summary: refill  -Clinical Lists Changes  Medications: Rx of XANAX 0.5 MG  TABS (ALPRAZOLAM) Take 1 tablet by mouth two times a day;  #60 x 1;  Signed;  Entered by: Jimmey Ralph LPN;  Authorized by: Tula Nakayama MD;  Method used: Printed then faxed to [facility]. [phone]*, [address] [place], [place], [place]  [address], Ph: [phone] or [phone], Fax: [phone]    Prescriptions: XANAX 0.5 MG  TABS (ALPRAZOLAM) Take 1 tablet by mouth two times a day  #60 x 1   Entered by:   Jimmey Ralph LPN   Authorized by:   Tula Nakayama MD   Signed by:   Jimmey Ralph LPN on [id number]   Method used:   Printed then faxed to ...       [facility]  [address] [phone]* (retail)       [address]       [place], [place]  [address]       Ph: [phone] or [phone]       Fax: [phone]   RxID:   [id number]

## 2010-07-11 NOTE — Miscellaneous (Signed)
Summary: Refill  Clinical Lists Changes  Medications: Rx of XANAX 0.5 MG  TABS (ALPRAZOLAM) Take 1 tablet by mouth two times a day;  #60 x 1;  Signed;  Entered by: Kate Sable;  Authorized by: Tula Nakayama MD;  Method used: Printed then faxed to [facility]. [phone]*, [address], [place], [place], [place]  [address], Ph: [phone] or [phone], Fax: [phone]    Prescriptions: Duanne Moron 0.5 MG  TABS (ALPRAZOLAM) Take 1 tablet by mouth two times a day  #60 x 1   Entered by:   Kate Sable   Authorized by:   Tula Nakayama MD   Signed by:   Kate Sable on 04/14/2008   Method used:   Printed then faxed to ...       [facility]  [address] [phone]* (retail)       [address]       [place], [place]  [address]       Ph: [phone] or [phone]       Fax: [phone]   RxID:   [id number]

## 2010-07-11 NOTE — Assessment & Plan Note (Signed)
Summary: INJ  Nurse Visit   Vital Signs:  Patient profile:   65 year old female Menstrual status:  hysterectomy O2 Sat:      94 % Pulse rate:   90 / minute Resp:     16 per minute  Vitals Entered By: Kate Sable (Oct 15, 2008 11:24 AM)  CC: Patient came in c/o severe back pain and requested some pain shots. Gave depo 80 and toradol 60    Allergies: 1)  ! Penicillin     Medication Administration  Injection # 1:    Medication: Depo- Medrol 80mg     Diagnosis: BACK PAIN (ICD-724.5)    Route: IM    Site: LUOQ gluteus    Exp Date: 12/2010    Lot #: [id number]    Mfr: Pharmacia    Comments: 80 mg given    Patient tolerated injection without complications    Given by: Kate Sable (Oct 15, 2008 11:26 AM)  Injection # 2:    Medication: Ketorolac-Toradol 15mg     Diagnosis: BACK PAIN (ICD-724.5)    Route: IM    Site: LUOQ gluteus    Exp Date: 07/2010    Lot #: [id number]    Mfr: Novaplus    Comments: 60 mg given    Patient tolerated injection without complications    Given by: Kate Sable (Oct 15, 2008 11:27 AM)  Orders Added: 1)  Depo- Medrol 80mg  [J1040] 2)  Ketorolac-Toradol 15mg  [J1885] 3)  Admin of Therapeutic Inj  intramuscular or subcutaneous [[id number]]      Medication Administration  Injection # 1:    Medication: Depo- Medrol 80mg     Diagnosis: BACK PAIN (ICD-724.5)    Route: IM    Site: LUOQ gluteus    Exp Date: 12/2010    Lot #: [id number]    Mfr: Pharmacia    Comments: 80 mg given    Patient tolerated injection without complications    Given by: Kate Sable (Oct 15, 2008 11:26 AM)  Injection # 2:    Medication: Ketorolac-Toradol 15mg     Diagnosis: BACK PAIN (ICD-724.5)    Route: IM    Site: LUOQ gluteus    Exp Date: 07/2010    Lot #: [id number]    Mfr: Novaplus    Comments: 60 mg given    Patient tolerated injection without complications    Given by: Kate Sable (Oct 15, 2008 11:27 AM)  Orders Added: 1)  Depo- Medrol 80mg  [J1040] 2)   Ketorolac-Toradol 15mg  [J1885] 3)  Admin of Therapeutic Inj  intramuscular or subcutaneous [id number]

## 2010-07-11 NOTE — Assessment & Plan Note (Signed)
Summary: office visit   Vital Signs:  Patient profile:   65 year old female Menstrual status:  hysterectomy Height:      66.5 inches Weight:      205.50 pounds BMI:     32.79 Pulse rate:   84 / minute Pulse rhythm:   regular Resp:     16 per minute BP sitting:   115 / 75  (left arm)  Vitals Entered By: Jimmey Ralph LPN (November 11, 624THL 12:04 PM)  Nutrition Counseling: Patient's BMI is greater than 25 and therefore counseled on weight management options. CC: follow-up visit, Hypertension Management Is Patient Diabetic? Yes Did you bring your meter with you today? No Pain Assessment Patient in pain? no        CC:  follow-up visit and Hypertension Management.  History of Present Illness: Reports  that she has been doing     well. Denies recent fever or chills. Denies sinus pressure, nasal congestion , ear pain or sore throat. Denies chest congestion, or cough productive of sputum. Denies chest pain, palpitations, PND, orthopnea or leg swelling. Denies abdominal pain, nausea, vomitting, diarrhea or constipation. Denies change in bowel movements or bloody stool.  Denies  joint pain, swelling, or reduced mobility.She does ahve chronic ack pain. Denies headaches, vertigo, seizures. Denies depression, anxiety or insomnia. Denies  rash, lesions, or itch.     Hypertension History:      Positive major cardiovascular risk factors include female age 75 years old or older, diabetes, hyperlipidemia, and hypertension.  Negative major cardiovascular risk factors include non-tobacco-user status.     Current Medications (verified): 1)  Metformin Hcl 500 Mg  Tabs (Metformin Hcl) .... Take  Two  Tablet By Mouth Two Times A Day 2)  Xanax 0.5 Mg  Tabs (Alprazolam) .... Take 1 Tablet By Mouth Two Times A Day 3)  Paroxetine Hcl 20 Mg  Tabs (Paroxetine Hcl) .... Take 1 Tablet By Mouth Once A Day 4)  Lovastatin 20 Mg  Tabs (Lovastatin) .... Take 1 Tab By Mouth At Bedtime 5)  Potassium  Citrate 540 Mg  Tbcr (Potassium Citrate) .... Take 1 Tablet By Mouth Two Times A Day 6)  Omeprazole 20 Mg  Tbec (Omeprazole) .... Take 1 Tablet By Mouth Twice A Day 7)  Darvocet-N 100 100-650 Mg  Tabs (Propoxyphene N-Apap) .... Take 1 Tablet By Mouth Once A Day 8)  Amlodipine Besylate 10 Mg Tabs (Amlodipine Besylate) .... Take One Tab By Mouth Once Daily 9)  Aspirin 81 Mg Tabs (Aspirin) .... As Directed 10)  Propoxyphene N-Apap 100-650 Mg Tabs (Propoxyphene N-Apap) .... At Bedtime 11)  Potassium Citrate 10 Meq (1080 Mg) Cr-Tabs (Potassium Citrate) .... As Directed 12)  Metformin Hcl 1000 Mg Tabs (Metformin Hcl) .... Two Times A Day  Allergies (verified): 1)  ! Penicillin  Review of Systems      See HPI Eyes:  Denies blurring, discharge, and double vision. GU:  Complains of dysuria and urinary frequency. Neuro:  Complains of headaches; denies seizures, sensation of room spinning, tingling, tremors, and visual disturbances; intermittent headaches. Endo:  Denies cold intolerance, excessive hunger, excessive thirst, excessive urination, heat intolerance, polyuria, and weight change; tests blood sugar onc daily, fastings are less than 130. Heme:  Denies abnormal bruising and bleeding. Allergy:  Complains of seasonal allergies; denies hives or rash, itching eyes, and sneezing.  Physical Exam  General:  alert, well-hydrated, and overweight-appearing.  HEENT: No facial asymmetry,  EOMI, No sinus tenderness, TM's Clear, oropharynx  pink and moist.   Chest: Clear to auscultation bilaterally.  CVS: S1, S2, No murmurs, No S3.   Abd: Soft, Nontender.  MS: Adequate ROM spine, hips, shoulders and knees.  Ext: No edema.   CNS: CN 2-12 intact, power tone and sensation normal throughout.   Skin: Intact, no visible lesions or rashes.  Psych: Good eye contact, normal affect.  Memory intact, not anxious or depressed appearing.    Diabetes Management Exam:    Foot Exam (with socks and/or shoes not  present):       Sensory-Monofilament:          Left foot: normal          Right foot: normal       Inspection:          Left foot: normal          Right foot: normal       Nails:          Left foot: normal          Right foot: normal   Impression & Recommendations:  Problem # 1:  OBESITY, UNSPECIFIED (ICD-278.00) Assessment Unchanged  Ht: 66.5 (04/21/2009)   Wt: 205.50 (04/21/2009)   BMI: 32.79 (04/21/2009)  Problem # 2:  HYPERLIPIDEMIA (ICD-272.4) Assessment: Comment Only  Her updated medication list for this problem includes:    Lovastatin 20 Mg Tabs (Lovastatin) .Marland Kitchen... Take 1 tab by mouth at bedtime  Orders: T-Hepatic Function [phone]) T-Lipid Profile [phone])  Labs Reviewed: SGOT: 15 (09/08/2008)   SGPT: 13 (09/08/2008)  10 Yr Risk Heart Disease: 11 %   HDL:44 (09/08/2008), 44 (07/25/2007)  LDL:88 (09/08/2008), 87 (07/25/2007)  Chol:165 (09/08/2008), 157 (07/25/2007)  Trig:167 (09/08/2008), 128 (07/25/2007)  Problem # 3:  DIABETES MELLITUS, TYPE II (ICD-250.00) Assessment: Unchanged  Her updated medication list for this problem includes:    Metformin Hcl 500 Mg Tabs (Metformin hcl) .Marland Kitchen... Take  two  tablet by mouth two times a day    Aspirin 81 Mg Tabs (Aspirin) .Marland Kitchen... As directed    Metformin Hcl 1000 Mg Tabs (Metformin hcl) .Marland Kitchen..Marland Kitchen Two times a day  Orders: Glucose, (CBG) (82962) Hemoglobin A1C (83036)  Labs Reviewed: Creat: 1.03 (09/08/2008)    Reviewed HgBA1c results: 6.6 (04/21/2009)  6.5 (01/19/2009)  Problem # 4:  HYPERTENSION (ICD-401.9) Assessment: Improved  Her updated medication list for this problem includes:    Amlodipine Besylate 10 Mg Tabs (Amlodipine besylate) .Marland Kitchen... Take one tab by mouth once daily  Orders: T-Basic Metabolic Panel (99991111)  BP today: 115/75 Prior BP: 110/80 (01/19/2009)  10 Yr Risk Heart Disease: 11 %  Labs Reviewed: K+: 4.8 (09/08/2008) Creat: : 1.03 (09/08/2008)   Chol: 165 (09/08/2008)   HDL: 44  (09/08/2008)   LDL: 88 (09/08/2008)   TG: 167 (09/08/2008)  Problem # 5:  ACUTE CYSTITIS (ICD-595.0) Assessment: Comment Only  Her updated medication list for this problem includes:    Cipro 500 Mg Tabs (Ciprofloxacin hcl) ..... One tab by mouth bid  Orders: UA Dipstick W/ Micro (manual) (81000) T-Culture, Urine [id number])  Encouraged to push clear liquids, get enough rest, and take acetaminophen as needed. To be seen in 10 days if no improvement, sooner if worse.  Complete Medication List: 1)  Metformin Hcl 500 Mg Tabs (Metformin hcl) .... Take  two  tablet by mouth two times a day 2)  Xanax 0.5 Mg Tabs (Alprazolam) .... Take 1 tablet by mouth two times a day 3)  Paroxetine Hcl 20 Mg Tabs (Paroxetine  hcl) .... Take 1 tablet by mouth once a day 4)  Lovastatin 20 Mg Tabs (Lovastatin) .... Take 1 tab by mouth at bedtime 5)  Potassium Citrate 540 Mg Tbcr (Potassium citrate) .... Take 1 tablet by mouth two times a day 6)  Omeprazole 20 Mg Tbec (Omeprazole) .... Take 1 tablet by mouth twice a day 7)  Darvocet-n 100 100-650 Mg Tabs (Propoxyphene n-apap) .... Take 1 tablet by mouth once a day 8)  Amlodipine Besylate 10 Mg Tabs (Amlodipine besylate) .... Take one tab by mouth once daily 9)  Aspirin 81 Mg Tabs (Aspirin) .... As directed 10)  Propoxyphene N-apap 100-650 Mg Tabs (Propoxyphene n-apap) .... At bedtime 11)  Potassium Citrate 10 Meq (1080 Mg) Cr-tabs (Potassium citrate) .... As directed 12)  Metformin Hcl 1000 Mg Tabs (Metformin hcl) .... Two times a day 13)  Cipro 500 Mg Tabs (Ciprofloxacin hcl) .... One tab by mouth bid  Hypertension Assessment/Plan:      The patient's hypertensive risk group is category C: Target organ damage and/or diabetes.  Her calculated 10 year risk of coronary heart disease is 11 %.  Today's blood pressure is 115/75.  Her blood pressure goal is < 130/80.  Patient Instructions: 1)  Please schedule a follow-up appointment in 3.5 months. 2)  It is  important that you exercise regularly at least 20 minutes 5 times a week. If you develop chest pain, have severe difficulty breathing, or feel very tired , stop exercising immediately and seek medical attention. 3)  You need to lose weight. Consider a lower calorie diet and regular exercise.  4)  BMP prior to visit, ICD-9: 5)  Hepatic Panel prior to visit, ICD-9:  fasting due 6)  Lipid Panel prior to visit, ICD-9: Prescriptions: CIPRO 500 MG TABS (CIPROFLOXACIN HCL) one tab by mouth bid  #14 x 0   Entered by:   Jimmey Ralph LPN   Authorized by:   Tula Nakayama MD   Signed by:   Jimmey Ralph LPN on [id number]   Method used:   Electronically to        [facility]. [phone]* (retail)       [address]       [place], [place]  [address]       Ph: [phone] or [phone]       Fax: [phone]   RxID:   [id number]   Laboratory Results   Urine Tests  Date/Time Received: 04/21/09 Date/Time Reported: 04/21/09  Routine Urinalysis   Color: yellow Appearance: Clear Glucose: negative   (Normal Range: Negative) Bilirubin: negative   (Normal Range: Negative) Ketone: negative   (Normal Range: Negative) Spec. Gravity: 1.015   (Normal Range: 1.003-1.035) Blood: moderate   (Normal Range: Negative) pH: 5.5   (Normal Range: 5.0-8.0) Protein: 100   (Normal Range: Negative) Urobilinogen: 0.2   (Normal Range: 0-1) Nitrite: negative   (Normal Range: Negative) Leukocyte Esterace: large   (Normal Range: Negative)     Blood Tests   Date/Time Received: 04/21/09 Date/Time Reported: 04/21/09  Glucose (random): 96 mg/dL   (Normal Range: 70-105) HGBA1C: 6.6%   (Normal Range: Non-Diabetic - 3-6%   Control Diabetic - 6-8%)

## 2010-07-11 NOTE — Progress Notes (Signed)
Summary: medicines  Phone Note Call from Patient   Summary of Call: needs her medicine refilled   pot. pills and her back pill  [facility] [address] Initial call taken by: Dierdre Harness,  January 31, 2009 4:44 PM    Prescriptions: POTASSIUM CITRATE 540 MG  TBCR (POTASSIUM CITRATE) Take 1 tablet by mouth two times a day  #60 x 4   Entered by:   Jimmey Ralph LPN   Authorized by:   Tula Nakayama MD   Signed by:   Jimmey Ralph LPN on [id number]   Method used:   Printed then faxed [facility] ...       [facility]  [address] [phone]* (retail)       [address]       [place], [place]  [address]       Ph: [phone] or [phone]       Fax: [phone]   RxID:   [id number] DARVOCET-N 100 100-650 MG  TABS (PROPOXYPHENE N-APAP) Take 1 tablet by mouth once a day  #30 x 4   Entered by:   Jimmey Ralph LPN   Authorized by:   Tula Nakayama MD   Signed by:   Jimmey Ralph LPN on [id number]   Method used:   Printed then faxed to ...       [facility]  [address] [phone]* (retail)       [address]       [place], [place]  [address]       Ph: [phone] or [phone]       Fax: [phone]   RxID:   [id number]

## 2010-07-11 NOTE — Medication Information (Signed)
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

## 2010-07-11 NOTE — Miscellaneous (Signed)
Summary: refill  Clinical Lists Changes  Medications: Rx of DARVOCET-N 100 100-650 MG  TABS (PROPOXYPHENE N-APAP) Take 1 tablet by mouth once a day;  #30 x 1;  Signed;  Entered by: Jimmey Ralph LPN;  Authorized by: Tula Nakayama MD;  Method used: Printed then faxed to [facility]. [phone]*, [address], [place], [place], [place]  [address], Ph: [phone] or [phone], Fax: [phone]    Prescriptions: DARVOCET-N 100 100-650 MG  TABS (PROPOXYPHENE N-APAP) Take 1 tablet by mouth once a day  #30 x 1   Entered by:   Jimmey Ralph LPN   Authorized by:   Tula Nakayama MD   Signed by:   Jimmey Ralph LPN on [id number]   Method used:   Printed then faxed to ...       [facility]  [address] [phone]* (retail)       [address]       [place], [place]  [address]       Ph: [phone] or [phone]       Fax: [phone]   RxID:   [id number]

## 2010-07-11 NOTE — Assessment & Plan Note (Signed)
Summary: POSS.FX TOE,RT FOOT/NEEDS XRAY/REF [facility]/EVERCARE/CAF   Vital Signs:  Patient profile:   65 year old female Menstrual status:  hysterectomy Weight:      202 pounds Pulse rate:   68 / minute Resp:     16 per minute  Vitals Entered By: Arther Abbott MD (May 17, 2009 2:12 PM)  Visit Type:  Initial Consult Referring Provider:  same Primary Provider:  Moshe Cipro, M.D.  CC:  pain right foot .  History of Present Illness: I saw Cheryl Abbott in the office today for an initial visit.  She is a 65 years old woman with the complaint of:  chief complaint:  right little toe pain, referral [facility].  The patient reports that on November 25 she was changing a light bulb standing in the chair fell backwards. She's had pain in her small toe. Since that time. Pain is a 4/10 worst with walking relieved with rest, ice, and Tylenol. Pain is dull.     Allergies: 1)  ! Penicillin  Past History:  Past Medical History: Current Problems:  ANXIETY DISORDER (ICD-300.00) BACK PAIN (ICD-724.5) DIABETES MELLITUS, TYPE I (ICD-250.01) HYPERLIPIDEMIA (ICD-272.4) HYPERTENSION (ICD-401.9) SLEEP APNEA Vein disease  Family History: Mom HTN,Dm,CHF Dad deceased empyzema,HTN Sister 3 HTN< Brother 19 DM,GERD FH of Cancer:  Family History of Diabetes Family History Coronary Heart Disease female < 66 Family History of Arthritis  Social History: Divorced retired Two children  Never Smoked Alcohol use-no Drug use-no 3 cups of caffeine per day  Review of Systems General:  Denies weight loss, weight gain, fever, chills, and fatigue. Cardiac :  Denies chest pain, angina, heart attack, heart failure, poor circulation, blood clots, and phlebitis. Resp:  Complains of difficulty breathing; denies short of breath, COPD, cough, and pneumonia. GI:  Denies nausea, vomiting, diarrhea, constipation, difficulty swallowing, ulcers, GERD, and reflux. GU:  Denies kidney failure, kidney  transplant, kidney stones, burning, poor stream, testicular cancer, blood in urine, and . Neuro:  Complains of numbness; denies headache, dizziness, migraines, weakness, tremor, and unsteady walking. MS:  Complains of joint pain; denies rheumatoid arthritis, joint swelling, gout, bone cancer, osteoporosis, and . Endo:  Complains of diabetes; denies thyroid disease and goiter. Psych:  Complains of depression and anxiety; denies mood swings, panic attack, bipolar, and schizophrenia. Derm:  Denies eczema, cancer, and itching. EENT:  Denies poor vision, cataracts, glaucoma, poor hearing, vertigo, ears ringing, sinusitis, hoarseness, toothaches, and bleeding gums. Immunology:  Denies seasonal allergies, sinus problems, and allergic to bee stings. Lymphatic:  Denies lymph node cancer and lymph edema.  Physical Exam  Additional Exam:  vital signs see recorded data are stable  General appearance normal appearance  Orientation x3 normal  Mood and affect. Normal  Body area: RIGHT foot. There is some tenderness over the small toe. She is ambulating with a limp. She has good pulse in temperature to the extremity with normal sensation. Her coordination is normal. Her balance may be a little off      Impression & Recommendations:  Problem # 1:  FOOT PAIN (ICD-729.5) Assessment New  3 views, RIGHT foot for pain, RIGHT small toe.  At the distal aspect of the proximal phalanx. It appears to be a cortical irregularity, which may be consistent with a fracture.  Impression possible fracture, small toe, proximal phalanx, distal aspect.  Orders: New Patient Level II [id number]) Foot x-ray complete, minimum 3 views [id number])  Patient Instructions: 1)  Wear post shoe for 3 weeks  2)  no f/u  necessary

## 2010-07-11 NOTE — Miscellaneous (Signed)
Summary: refill  Clinical Lists Changes  Medications: Rx of POTASSIUM CITRATE 540 MG  TBCR (POTASSIUM CITRATE) Take 1 tablet by mouth two times a day;  #60 x 3;  Signed;  Entered by: Jimmey Ralph LPN;  Authorized by: Tula Nakayama MD;  Method used: Electronically to [facility]. [phone]*, [address], [place], [place], [place]  [address], Ph: [phone] or [phone], Fax: [phone]    Prescriptions: POTASSIUM CITRATE 540 MG  TBCR (POTASSIUM CITRATE) Take 1 tablet by mouth two times a day  #60 x 3   Entered by:   Jimmey Ralph LPN   Authorized by:   Tula Nakayama MD   Signed by:   Jimmey Ralph LPN on [id number]   Method used:   [facility]        [facility] [phone]* (retail)       [address]       [place], [place]  [address]       Ph: [phone] or [phone]       Fax: [phone]   RxID:   [id number]

## 2010-07-11 NOTE — Miscellaneous (Signed)
Summary: [facility]   [facility]   Imported By: Dierdre Harness 01/20/2010 14:03:41  _____________________________________________________________________  External Attachment:    Type:   Image     Comment:   External Document

## 2010-07-11 NOTE — Letter (Signed)
Summary: MISC  MISC   Imported By: Dierdre Harness 11/01/2009 15:08:28  _____________________________________________________________________  External Attachment:    Type:   Image     Comment:   External Document

## 2010-07-11 NOTE — Miscellaneous (Signed)
Summary: Refill  Clinical Lists Changes  Medications: Rx of XANAX 0.5 MG  TABS (ALPRAZOLAM) Take 1 tablet by mouth two times a day;  #60 x 3;  Signed;  Entered by: Kate Sable;  Authorized by: Tula Nakayama MD;  Method used: Printed then faxed to [facility]. [phone]*, [address] [place], [place], [place]  [address], Ph: [phone] or [phone], Fax: [phone]    Prescriptions: Duanne Moron 0.5 MG  TABS (ALPRAZOLAM) Take 1 tablet by mouth two times a day  #60 x 3   Entered by:   Kate Sable   Authorized by:   Tula Nakayama MD   Signed by:   Kate Sable on 06/14/2008   Method used:   Printed then faxed to ...       [facility]  [address] [phone]* (retail)       [address]       [place], [place]  [address]       Ph: [phone] or [phone]       Fax: [phone]   RxID:   [id number]

## 2010-07-11 NOTE — Progress Notes (Signed)
Summary: rx  Phone Note Call from Patient   Summary of Call: [facility] [address] Initial call taken by: Dierdre Harness,  March 14, 2010 3:03 PM    Prescriptions: XANAX 0.5 MG  TABS (ALPRAZOLAM) Take 1 tablet by mouth two times a day  #30 x 2   Entered by:   Kate Sable LPN   Authorized by:   Tula Nakayama MD   Signed by:   Kate Sable LPN on [id number]   Method used:   Printed then faxed to ...       [facility]  [address] [phone]* (retail)       [address]       [place], [place]  [address]       Ph: [phone] or [phone]       Fax: [phone]   RxID:   [id number]

## 2010-07-11 NOTE — Progress Notes (Signed)
Summary: Progress note  Progress note   Imported By: Ruffin Pyo 05/17/2009 11:17:53  _____________________________________________________________________  External Attachment:    Type:   Image     Comment:   External Document

## 2010-07-11 NOTE — Letter (Signed)
Summary: med review sheets  med review sheets   Imported By: Lenn Cal 03/21/2010 08:46:49  _____________________________________________________________________  External Attachment:    Type:   Image     Comment:   External Document

## 2010-07-11 NOTE — Letter (Signed)
Summary: histry and physical  histry and physical   Imported By: Dierdre Harness 11/01/2009 14:53:17  _____________________________________________________________________  External Attachment:    Type:   Image     Comment:   External Document

## 2010-07-11 NOTE — Assessment & Plan Note (Signed)
Summary: office visit   Vital Signs:  Patient Profile:   65 Years Old Female Height:     65 inches Weight:      203.38 pounds BMI:     33.97 O2 Sat:      94 % Pulse rate:   96 / minute Resp:     16 per minute BP sitting:   100 / 74  (left arm)  Pt. in pain?   no  Vitals Entered By: Kate Sable (May 10, 2008 11:30 AM)                  Chief Complaint:  Follow up, headache, and tired all the time.  History of Present Illness: Pt. doing fairly well. Sh e does c/o chronic fatigue and headache . She has had no recent fever or chills. She denies symptoms of uncontrolled blood sugars. She has chronic back pain which is unchanged.    Current Allergies: ! PENICILLIN     Review of Systems  General      Complains of fatigue and malaise.  ENT      Denies earache, hoarseness, nasal congestion, and sinus pressure.  CV      Denies chest pain or discomfort, palpitations, shortness of breath with exertion, and swelling of feet.  Resp      Denies cough, sputum productive, and wheezing.  GI      Denies abdominal pain, constipation, diarrhea, nausea, and vomiting.  GU      Complains of dysuria and urinary frequency.  MS      Complains of joint pain, low back pain, mid back pain, and muscle weakness.  Psych      Complains of anxiety and depression.      Denies suicidal thoughts/plans and thoughts of violence.  Endo      Denies cold intolerance, excessive hunger, excessive thirst, excessive urination, heat intolerance, polyuria, and weight change.   Physical Exam  General:     overweight-appearing.   Head:     Normocephalic and atraumatic without obvious abnormalities. No apparent alopecia or balding. Eyes:     vision grossly intact.   Ears:     External ear exam shows no significant lesions or deformities.  Otoscopic examination reveals clear canals, tympanic membranes are intact bilaterally without bulging, retraction, inflammation or discharge. Hearing  is grossly normal bilaterally. Nose:     no external erythema and no nasal discharge.   Mouth:     pharynx pink and moist and fair dentition.   Neck:     No deformities, masses, or tenderness noted. Lungs:     Normal respiratory effort, chest expands symmetrically. Lungs are clear to auscultation, no crackles or wheezes. Heart:     Normal rate and regular rhythm. S1 and S2 normal without gallop, murmur, click, rub or other extra sounds. Abdomen:     soft and non-tender.   Msk:     Decreased ROM spine. Extremities:     No edema. Neurologic:     alert & oriented X3, cranial nerves II-XII intact, strength normal in all extremities, and sensation intact to light touch.   Skin:     Intact without suspicious lesions or rashes Psych:     Oriented X3, memory intact for recent and remote, normally interactive, good eye contact, not anxious appearing, and not depressed appearing.      Impression & Recommendations:  Problem # 1:  FATIGUE (ICD-780.79) Assessment: Comment Only  Orders: T-CBC w/Diff [id number])   Problem #  2:  SCREENING FOR THYROID DISORDER (ICD-V77.0) Assessment: Comment Only  Orders: T-TSH [id number])   Problem # 3:  DIABETES MELLITUS, TYPE II (ICD-250.00) Assessment: Improved  Her updated medication list for this problem includes:    Metformin Hcl 500 Mg Tabs (Metformin hcl) .Marland Kitchen... Take  two  tablet by mouth two times a day  Labs Reviewed: HgBA1c: 6.5 (05/10/2008)   Creat: 0.83 (07/25/2007)   Microalbumin: 21.30 (10/02/2006)   Problem # 4:  HYPERLIPIDEMIA (ICD-272.4) Assessment: Comment Only  Her updated medication list for this problem includes:    Lovastatin 20 Mg Tabs (Lovastatin) .Marland Kitchen... Take 1 tab by mouth at bedtime  Orders: T-Lipid Profile [phone]) T-Hepatic Function [id number])  Labs Reviewed: Chol: 157 (07/25/2007)   HDL: 44 (07/25/2007)   LDL: 87 (07/25/2007)   TG: 128 (07/25/2007) SGOT: 15 (07/25/2007)   SGPT: 17  (07/25/2007)   Problem # 5:  HYPERTENSION (ICD-401.9) Assessment: Unchanged  Her updated medication list for this problem includes:    Amlodipine Besylate 10 Mg Tabs (Amlodipine besylate) .Marland Kitchen... Take one tab by mouth once daily  Orders: T-Basic Metabolic Panel ([id number])  BP today: 100/74 Prior BP: 110/70 (02/02/2008)  Labs Reviewed: Creat: 0.83 (07/25/2007) Chol: 157 (07/25/2007)   HDL: 44 (07/25/2007)   LDL: 87 (07/25/2007)   TG: 128 (07/25/2007)   Complete Medication List: 1)  Metformin Hcl 500 Mg Tabs (Metformin hcl) .... Take  two  tablet by mouth two times a day 2)  Xanax 0.5 Mg Tabs (Alprazolam) .... Take 1 tablet by mouth two times a day 3)  Paroxetine Hcl 20 Mg Tabs (Paroxetine hcl) .... Take 1 tablet by mouth once a day 4)  Lovastatin 20 Mg Tabs (Lovastatin) .... Take 1 tab by mouth at bedtime 5)  Potassium Citrate 540 Mg Tbcr (Potassium citrate) .... Take 1 tablet by mouth two times a day 6)  Omeprazole 20 Mg Tbec (Omeprazole) .... Take 1 tablet by mouth twice a day 7)  Darvocet-n 100 100-650 Mg Tabs (Propoxyphene n-apap) .... Take 1 tablet by mouth once a day 8)  Levaquin 500 Mg Tabs (Levofloxacin) .... Take 1 tablet by mouth once a day 9)  Diflucan 100 Mg Tabs (Fluconazole) .... Take 1 tablet by mouth once a day as needed 10)  Amlodipine Besylate 10 Mg Tabs (Amlodipine besylate) .... Take one tab by mouth once daily   Patient Instructions: 1)  F/u in 3.5 months. 2)  It is important that you exercise regularly at least 20 minutes 5 times a week. If you develop chest pain, have severe difficulty breathing, or feel very tired , stop exercising immediately and seek medical attention. 3)  You need to lose weight. Consider a lower calorie diet and regular exercise.  4)  Check your blood sugars regularly. If your readings are usually above : or below 70 you should contact our office. 5)  Check your Blood Pressure regularly. If it is above150/95  you should make an  appointment. 6)  BMP prior to visit, ICD-9: 7)  Hepatic Panel prior to visit, ICD-9: 8)  Lipid Panel prior to visit, ICD-9:   past due do asap 9)  TSH prior to visit, ICD-9: 10)  CBC w/ Diff prior to visit, ICD-9:   Prescriptions: OMEPRAZOLE 20 MG  TBEC (OMEPRAZOLE) Take 1 tablet by mouth twice a day  #605 x 5   Entered and Authorized by:   Tula Nakayama MD   Signed by:   Tula Nakayama MD on 05/10/2008   Method used:  Electronically to        [facility]. [phone]* (retail)       [address]       [place], [place]  [address]       Ph: [phone] or [phone]       Fax: [phone]   RxID:   [id number]  ]  Laboratory Results   Blood Tests   Date/Time Received: May 10, 2008 2pm Date/Time Reported: May 10, 2008 2pm  Glucose (random): 70 mg/dL   (Normal Range: 70-105) HGBA1C: 6.5%   (Normal Range: Non-Diabetic - 3-6%   Control Diabetic - 6-8%)

## 2010-07-11 NOTE — Miscellaneous (Signed)
Summary: refill  Clinical Lists Changes  Medications: Rx of POTASSIUM CITRATE 540 MG  TBCR (POTASSIUM CITRATE) Take 1 tablet by mouth two times a day;  #60 x 4;  Signed;  Entered by: Kate Sable;  Authorized by: Tula Nakayama MD;  Method used: Electronically to [facility]. [phone]*, [address] [place], [place], [place]  [address], Ph: [phone] or [phone], Fax: [phone]    Prescriptions: POTASSIUM CITRATE 540 MG  TBCR (POTASSIUM CITRATE) Take 1 tablet by mouth two times a day  #60 x 4   Entered by:   Kate Sable   Authorized by:   Tula Nakayama MD   Signed by:   Kate Sable on 11/25/2008   Method used:   Electronically to        [facility]. [phone]* (retail)       [address]       [place], [place]  [address]       Ph: [phone] or [phone]       Fax: [phone]   RxID:   [id number]

## 2010-07-11 NOTE — Progress Notes (Signed)
Summary: note  Phone Note Call from Patient   Summary of Call: pt wanted doc to know that dr. Freda Munro said she had kidney stones and thats why she couldn't get better. she has got to have surgery.  Initial call taken by: Lenn Cal,  December 20, 2009 2:28 PM  Follow-up for Phone Call        noted and thanks Follow-up by: Tula Nakayama MD,  December 20, 2009 4:37 PM

## 2010-07-11 NOTE — Miscellaneous (Signed)
Summary: Home Care Report  Home Care Report   Imported By: Dierdre Harness 01/23/2010 13:46:10  _____________________________________________________________________  External Attachment:    Type:   Image     Comment:   External Document

## 2010-07-11 NOTE — Assessment & Plan Note (Signed)
Summary: office visit   Vital Signs:  Patient profile:   65 year old female Menstrual status:  hysterectomy Height:      66.5 inches Weight:      204.50 pounds BMI:     32.63 O2 Sat:      94 % Pulse rate:   120 / minute Pulse rhythm:   regular Resp:     16 per minute BP sitting:   110 / 80  (left arm) Cuff size:   large  Vitals Entered By: Kate Sable LPN (May 23, 624THL 579FGE PM)  Nutrition Counseling: Patient's BMI is greater than 25 and therefore counseled on weight management options. CC: she does well while she is taking the cipro but after she's done with it, the uti comes right back. She is going to see Freda Munro the 1st of the month. She missed her app with him because her mother was sick. She has no energy, doesn't feel well, feels weak and tired and depressed due to her mother's illness   Primary Care Provider:  Moshe Cipro, M.D.  CC:  she does well while she is taking the cipro but after she's done with it, the uti comes right back. She is going to see Freda Munro the 1st of the month. She missed her app with him because her mother was sick. She has no energy, doesn't feel well, and feels weak and tired and depressed due to her mother's illness.  History of Present Illness: Pt reports recurrent urinary tract infections which are drainaing her energy and making her feel badly. she states as soon as she is off an antibiotic she has a flare. She had to miss her urology appt because of the health of her moother who is out of state, this is a major mental drain on her andshe feels herselfslipping into real depression about it.  Allergies: 1)  ! Penicillin 2)  ! Macrobid  Family History: Mom HTN,Dm,CHF, has end stage dementia in [place], in hospice for 1 month Dad deceased empyzema,HTN Sister 3 HTN< Brother 1 DM,GERD FH of Cancer:  Family History of Diabetes Family History Coronary Heart Disease female < 83 Family History of Arthritis  Review of Systems      See HPI General:   Complains of chills, fatigue, and sleep disorder. Eyes:  Denies discharge, double vision, and red eye. GI:  Complains of abdominal pain, diarrhea, and loss of appetite; denies bloody stools, nausea, and vomiting; 3 day history of diarreah which is much better on its ownw. GU:  Complains of dysuria and urinary frequency; 3 day history. MS:  Complains of joint pain, loss of strength, low back pain, and stiffness. Derm:  Denies itching, lesion(s), and rash. Neuro:  Denies headaches, seizures, and sensation of room spinning. Psych:  Complains of depression, easily tearful, and mental problems; denies thoughts of violence, unusual visions or sounds, and thoughts /plans of harming others; oversleeping and less of an apetite, having alot of family stress with illnessandloss. Endo:  Denies excessive thirst and excessive urination; fasting blood sugars when tested are seldom over 120. Heme:  Denies abnormal bruising and bleeding. Allergy:  Denies hives or rash and itching eyes.  Physical Exam  General:  Well-developed,well-nourished,in no acute distress; alert,appropriate and cooperative throughout examination HEENT: No facial asymmetry,  EOMI, No sinus tenderness, TM's Clear, oropharynx  pink and moist.   Chest: Clear to auscultation bilaterally.  CVS: S1, S2, No murmurs, No S3.   Abd: Soft, suprapubic tenderness, no renal angle tenderness  [id number]  ROM spine, adequate in hips, shoulders and knees.  Ext: No edema.   CNS: CN 2-12 intact, power tone and sensation normal throughout.   Skin: Intact, no visible lesions or rashes.  Psych: Good eye contact, normal affect.  Memory intact,  anxious and depressed appearing.    Impression & Recommendations:  Problem # 1:  ACUTE CYSTITIS (ICD-595.0) Assessment Comment Only  The following medications were removed from the medication list:    Ciprofloxacin Hcl 500 Mg Tabs (Ciprofloxacin hcl) .Marland Kitchen... Take 1 tablet by mouth two times a day Her updated  medication list for this problem includes:    Ciprofloxacin Hcl 500 Mg Tabs (Ciprofloxacin hcl) .Marland Kitchen... Take 1 tablet by mouth two times a day  Orders: T-Culture, Urine [id number])  Encouraged to push clear liquids, get enough rest, and take acetaminophen as needed. To be seen in 10 days if no improvement, sooner if worse.  Problem # 2:  OBESITY, UNSPECIFIED (ICD-278.00) Assessment: Unchanged  Ht: 66.5 (10/31/2009)   Wt: 204.50 (10/31/2009)   BMI: 32.63 (10/31/2009)  Problem # 3:  ANXIETY DISORDER (ICD-300.00) Assessment: Deteriorated  The following medications were removed from the medication list:    Paroxetine Hcl 20 Mg Tabs (Paroxetine hcl) .Marland Kitchen... Take 1 tablet by mouth once a day Her updated medication list for this problem includes:    Xanax 0.5 Mg Tabs (Alprazolam) .Marland Kitchen... Take 1 tablet by mouth two times a day    Paroxetine Hcl 40 Mg Tabs (Paroxetine hcl) .Marland Kitchen... Take 1 tablet by mouth once a day  Discussed medication use and relaxation techniques.   Problem # 4:  DIABETES MELLITUS, TYPE II (ICD-250.00) Assessment: Unchanged  Her updated medication list for this problem includes:    Metformin Hcl 500 Mg Tabs (Metformin hcl) .Marland Kitchen... Take  two  tablet by mouth two times a day    Aspirin 81 Mg Tabs (Aspirin) .Marland Kitchen... As directed  Labs Reviewed: Creat: 1.05 (08/26/2009)    Reviewed HgBA1c results: 6.3 (08/26/2009)  6.6 (04/21/2009)  Complete Medication List: 1)  Metformin Hcl 500 Mg Tabs (Metformin hcl) .... Take  two  tablet by mouth two times a day 2)  Xanax 0.5 Mg Tabs (Alprazolam) .... Take 1 tablet by mouth two times a day 3)  Potassium Citrate 540 Mg Tbcr (Potassium citrate) .... Take 1 tablet by mouth two times a day 4)  Omeprazole 20 Mg Tbec (Omeprazole) .... Take 1 tablet by mouth twice a day 5)  Amlodipine Besylate 10 Mg Tabs (Amlodipine besylate) .... Take one tab by mouth once daily 6)  Aspirin 81 Mg Tabs (Aspirin) .... As directed 7)  Tramadol Hcl 50 Mg Tabs  (Tramadol hcl) .... Take 1 tab bid 8)  Lovastatin 40 Mg Tabs (Lovastatin) .... One tab by mouth qd 9)  Tylenol 325 Mg Tabs (Acetaminophen) .... 2 at bedtime 10)  Ciprofloxacin Hcl 500 Mg Tabs (Ciprofloxacin hcl) .... Take 1 tablet by mouth two times a day 11)  Vitamin D (ergocalciferol) 50000 Unit Caps (Ergocalciferol) .... One capsule once weekly 12)  Paroxetine Hcl 40 Mg Tabs (Paroxetine hcl) .... Take 1 tablet by mouth once a day  Other Orders: T-Hepatic Function [id number]) T-Vitamin D (25-Hydroxy) [id number]) T- Hemoglobin A1C [id number])  Patient Instructions: 1)  F./U early August. 2)  Vit D hepatic and hBa1C. 3)  NCREASED DOSE OF PAROXETINE , AND KNOW THAT I AM CONCERNED ABOUT YOUR MOM. ( increased dose of paroxetine , take two 20mg  tabs daily till done) Prescriptions: PAROXETINE HCL 40  MG TABS (PAROXETINE HCL) Take 1 tablet by mouth once a day  #30 x 3   Entered and Authorized by:   Tula Nakayama MD   Signed by:   Tula Nakayama MD on 10/31/2009   Method used:   Printed then [facility] ...       [facility]  [address] [phone]* (retail)       [address]       [place], [place]  [address]       Ph: [phone] or [phone]       Fax: [phone]   RxID:   [id number] VITAMIN D (ERGOCALCIFEROL) 50000 UNIT CAPS (ERGOCALCIFEROL) one capsule once weekly  #4 x 4   Entered and Authorized by:   Tula Nakayama MD   Signed by:   Tula Nakayama MD on 10/31/2009   Method used:   Electronically to        [facility]. [phone]* (retail)       [address]       [place], [place]  [address]       Ph: [phone] or [phone]       Fax: [phone]   RxID:   [id number] CIPROFLOXACIN HCL 500 MG TABS (CIPROFLOXACIN HCL) Take 1 tablet by mouth two times a day  #28 x 0   Entered and Authorized by:   Tula Nakayama MD   Signed by:   Tula Nakayama MD on 10/31/2009   Method used:   Electronically to        [facility]. [phone]*  (retail)       [address]       [place], [place]  [address]       Ph: [phone] or [phone]       Fax: [phone]   RxID:   [id number]

## 2010-07-11 NOTE — Letter (Signed)
Summary: exercise stress echo w/doppler  exercise stress echo w/doppler   Imported By: Dierdre Harness 03/16/2010 16:12:37  _____________________________________________________________________  External Attachment:    Type:   Image     Comment:   External Document

## 2010-07-11 NOTE — Progress Notes (Signed)
Summary: message  Phone Note Call from Patient   Summary of Call: it is ok to leave the message on the answering machine no one there but her Initial call taken by: Dierdre Harness,  January 09, 2008 10:09 AM  Follow-up for Phone Call        advised pt she had a urine infection and would call in abx and medicine for yeast if needed per MD Follow-up by: Rennie Plowman,  January 09, 2008 11:06 AM    New/Updated Medications: NITROFURANTOIN MACROCRYSTAL 100 MG  CAPS (NITROFURANTOIN MACROCRYSTAL) Take 1 tablet by mouth two times a day DIFLUCAN 100 MG  TABS (FLUCONAZOLE) Take 1 tablet by mouth once a day as needed   Prescriptions: DIFLUCAN 100 MG  TABS (FLUCONAZOLE) Take 1 tablet by mouth once a day as needed  #2 x 0   Entered by:   Rennie Plowman   Authorized by:   Tula Nakayama MD   Signed by:   Rennie Plowman on 01/09/2008   Method used:   Electronically sent to ...       [facility] [phone]*       [address]       [place], [place]  [address]       Ph: [phone] or [phone]       Fax: [phone]   RxID:   [id number] NITROFURANTOIN MACROCRYSTAL 100 MG  CAPS (NITROFURANTOIN MACROCRYSTAL) Take 1 tablet by mouth two times a day  #20 x 0   Entered by:   Rennie Plowman   Authorized by:   Tula Nakayama MD   Signed by:   Rennie Plowman on 01/09/2008   Method used:   Electronically sent to ...       [facility] [phone]*       [address]       [place], [place]  [address]       Ph: [phone] or [phone]       Fax: [phone]   RxID:   [id number]

## 2010-07-11 NOTE — Progress Notes (Signed)
Summary: rx  Phone Note Call from Patient   Summary of Call: potissum is on back order. pharm told her to see if there was something else she could use. [id number] Initial call taken by: Lenn Cal,  August 10, 2009 2:39 PM  Follow-up for Phone Call        sent it to [facility] to see if she could get it there Patient aware Follow-up by: Kate Sable LPN,  March  2, 624THL 2:56 PM    Prescriptions: POTASSIUM CITRATE 540 MG  TBCR (POTASSIUM CITRATE) Take 1 tablet by mouth two times a day  #60 x 0   Entered by:   Kate Sable LPN   Authorized by:   Tula Nakayama MD   Signed by:   Kate Sable LPN on [id number]   Method used:   Electronically to        [facility]. [phone]* (retail)       [address] [place]  [address]       Ph: [phone]       Fax: [phone]   RxID:   [id number]

## 2010-07-11 NOTE — Progress Notes (Signed)
Summary: RX  Phone Note Call from Patient   Summary of Call: NEEDS HER DARVOCET RE[facility]LLED AT [facility] [place]  CALL [phone] Initial call taken by: Dierdre Harness,  August 31, 2008 4:39 PM      Prescriptions: DARVOCET-N 100 100-650 MG  TABS (PROPOXYPHENE N-APAP) Take 1 tablet by mouth once a day  #30 x 1   Entered by:   Jimmey Ralph LPN   Authorized by:   Tula Nakayama MD   Signed by:   Jimmey Ralph LPN on 075-GRM   Method used:   Printed then faxed to ...       [facility]  [address] [phone]* (retail)       [address]       [place], [place]  [address]       Ph: [phone] or [phone]       Fax: [phone]   RxID:   [id number]

## 2010-07-11 NOTE — Letter (Signed)
Summary: X RAYS  X RAYS   Imported By: Dierdre Harness 11/01/2009 15:16:52  _____________________________________________________________________  External Attachment:    Type:   Image     Comment:   External Document

## 2010-07-11 NOTE — Miscellaneous (Signed)
Summary: refill  Clinical Lists Changes  Medications: Rx of POTASSIUM CITRATE 540 MG  TBCR (POTASSIUM CITRATE) Take 1 tablet by mouth two times a day;  #30 x 2;  Signed;  Entered by: Jimmey Ralph LPN;  Authorized by: Tula Nakayama MD;  Method used: Electronically to [facility]. [phone]*, [address] [place], [place], [place]  [address], Ph: [phone] or [phone], Fax: [phone]    Prescriptions: POTASSIUM CITRATE 540 MG  TBCR (POTASSIUM CITRATE) Take 1 tablet by mouth two times a day  #30 x 2   Entered by:   Jimmey Ralph LPN   Authorized by:   Tula Nakayama MD   Signed by:   Jimmey Ralph LPN on 075-GRM   Method used:   Electronically to        [facility]. [phone]* (retail)       [address]       [place], [place]  [address]       Ph: [phone] or [phone]       Fax: [phone]   RxID:   [id number]

## 2010-07-11 NOTE — Assessment & Plan Note (Signed)
Summary: [place]  Nurse Visit   Vital Signs:  Patient profile:   65 year old female Menstrual status:  hysterectomy Height:      66.5 inches Weight:      207 pounds BMI:     33.03 Resp:     16 per minute BP sitting:   120 / 70  Vitals Entered By: Jimmey Ralph LPN (January 24, 624THL 4:27 PM) CC: burning with urination   Allergies: 1)  ! Penicillin Laboratory Results   Urine Tests  Date/Time Received: July 04, 2009  Date/Time Reported: July 04, 2009   Routine Urinalysis   Color: yellow Appearance: Clear Glucose: negative   (Normal Range: Negative) Bilirubin: negative   (Normal Range: Negative) Ketone: negative   (Normal Range: Negative) Spec. Gravity: 1.025   (Normal Range: 1.003-1.035) Blood: negative   (Normal Range: Negative) pH: 5.5   (Normal Range: 5.0-8.0) Protein: 100   (Normal Range: Negative) Urobilinogen: 0.2   (Normal Range: 0-1) Nitrite: negative   (Normal Range: Negative) Leukocyte Esterace: large   (Normal Range: Negative)       Orders Added: 1)  UA Dipstick W/ Micro (manual) [81000] 2)  T-Culture, Urine [id number] Prescriptions: CIPROFLOXACIN HCL 500 MG TABS (CIPROFLOXACIN HCL) Take 1 tablet by mouth two times a day  #14 x 0   Entered and Authorized by:   Tula Nakayama MD   Signed by:   Tula Nakayama MD on 07/04/2009   Method used:   Electronically to        [facility]. [phone]* (retail)       [address]       [place], [place]  [address]       Ph: [phone] or [phone]       Fax: [phone]   RxID:   [id number]   Laboratory Results   Urine Tests    Routine Urinalysis   Color: yellow Appearance: Clear Glucose: negative   (Normal Range: Negative) Bilirubin: negative   (Normal Range: Negative) Ketone: negative   (Normal Range: Negative) Spec. Gravity: 1.025   (Normal Range: 1.003-1.035) Blood: negative   (Normal Range: Negative) pH: 5.5   (Normal Range: 5.0-8.0) Protein: 100   (Normal  Range: Negative) Urobilinogen: 0.2   (Normal Range: 0-1) Nitrite: negative   (Normal Range: Negative) Leukocyte Esterace: large   (Normal Range: Negative)

## 2010-07-11 NOTE — Letter (Signed)
Summary: Letter  Letter   Imported By: Dierdre Harness 06/01/2009 09:34:21  _____________________________________________________________________  External Attachment:    Type:   Image     Comment:   External Document

## 2010-07-11 NOTE — Assessment & Plan Note (Signed)
Summary: CERTIFICATION   Allergies: 1)  ! Penicillin 2)  ! Macrobid   Complete Medication List: 1)  Metformin Hcl 500 Mg Tabs (Metformin hcl) .... Take  two  tablet by mouth two times a day 2)  Xanax 0.5 Mg Tabs (Alprazolam) .... Take 1 tablet by mouth two times a day 3)  Potassium Citrate 540 Mg Tbcr (Potassium citrate) .... Take 1 tablet by mouth two times a day 4)  Omeprazole 20 Mg Tbec (Omeprazole) .... Take 1 tablet by mouth twice a day 5)  Amlodipine Besylate 10 Mg Tabs (Amlodipine besylate) .... Take one tab by mouth once daily 6)  Aspirin 81 Mg Tabs (Aspirin) .... As directed 7)  Tramadol Hcl 50 Mg Tabs (Tramadol hcl) .... Take 1 tab bid 8)  Lovastatin 40 Mg Tabs (Lovastatin) .... One tab by mouth qd 9)  Tylenol 325 Mg Tabs (Acetaminophen) .... 2 at bedtime 10)  Ciprofloxacin Hcl 500 Mg Tabs (Ciprofloxacin hcl) .... Take 1 tablet by mouth two times a day 11)  Vitamin D (ergocalciferol) 50000 Unit Caps (Ergocalciferol) .... One capsule once weekly 12)  Paroxetine Hcl 40 Mg Tabs (Paroxetine hcl) .... Take 1 tablet by mouth once a day 13)  Antivert 12.5 Mg Tabs (Meclizine hcl) .... Take 1 tablet by mouth three times a day as needed for dizziness 14)  Antivert 12.5 Mg Tabs (Meclizine hcl) .... Take 1 tablet by mouth three times a day as needed for dizziness  Other Orders: Re-certification of Home Health [phone])

## 2010-07-11 NOTE — Assessment & Plan Note (Signed)
Summary: per PA   Vital Signs:  Patient profile:   65 year old female Menstrual status:  hysterectomy Height:      66.5 inches Weight:      199.75 pounds BMI:     31.87 O2 Sat:      94 % on Room air Pulse rate:   113 / minute Pulse rhythm:   regular Resp:     16 per minute BP sitting:   124 / 90  (right arm) BP standing:   124 / 90  (right arm)  Vitals Entered By: Baldomero Lamy LPN (August 30, 624THL 9:51 AM)  Nutrition Counseling: Patient's BMI is greater than 25 and therefore counseled on weight management options.  O2 Flow:  Room air CC: follow-up visit Is Patient Diabetic? Yes   Primary Care Provider:  Moshe Cipro, M.D.  CC:  follow-up visit.  History of Present Illness: Pt had attempted  lithotripsy in July which failed, sh had to be admitted on July 29 for 1 week. She currently has a ureteral stent, continued to have dizziness and feel weak, also pain in stomach and side. She was told she would always have UTI toll stones cleared, her 2 week supply of cipro finished this past Saturday. she denies fever or chills, she continues to have frequency with some dysuria. he c/o increased anxiety , and is somewhat reserved and timid at this visit, sttes in a note that exams whichh involve he stomach and side make her uncomfortable, I obtained permission to check her BP.  Current Medications (verified): 1)  Metformin Hcl 500 Mg  Tabs (Metformin Hcl) .... Take  Two  Tablet By Mouth Two Times A Day 2)  Xanax 0.5 Mg  Tabs (Alprazolam) .... Take 1 Tablet By Mouth Two Times A Day 3)  Potassium Citrate 540 Mg  Tbcr (Potassium Citrate) .... Take 1 Tablet By Mouth Two Times A Day 4)  Omeprazole 20 Mg  Tbec (Omeprazole) .... Take 1 Tablet By Mouth Twice A Day 5)  Amlodipine Besylate 10 Mg Tabs (Amlodipine Besylate) .... Take One Tab By Mouth Once Daily 6)  Aspirin 81 Mg Tabs (Aspirin) .... As Directed 7)  Tramadol Hcl 50 Mg Tabs (Tramadol Hcl) .... Take 1 Tab Bid 8)  Lovastatin 40 Mg Tabs  (Lovastatin) .... One Tab By Mouth Qd 9)  Tylenol 325 Mg Tabs (Acetaminophen) .... 2 At Bedtime 10)  Ciprofloxacin Hcl 500 Mg Tabs (Ciprofloxacin Hcl) .... Take 1 Tablet By Mouth Two Times A Day 11)  Paroxetine Hcl 40 Mg Tabs (Paroxetine Hcl) .... Take 1 Tablet By Mouth Once A Day 12)  Antivert 12.5 Mg Tabs (Meclizine Hcl) .... Take 1 Tablet By Mouth Three Times A Day As Needed For Dizziness 13)  Antivert 12.5 Mg Tabs (Meclizine Hcl) .... Take 1 Tablet By Mouth Three Times A Day As Needed For Dizziness  Allergies (verified): 1)  ! Penicillin 2)  ! Macrobid  Review of Systems      See HPI General:  Complains of fatigue and loss of appetite. Eyes:  Denies discharge, eye pain, and red eye. ENT:  Denies hoarseness, nasal congestion, sinus pressure, and sore throat. CV:  Denies chest pain or discomfort, palpitations, and swelling of feet. Resp:  Denies cough and sputum productive. GI:  Complains of abdominal pain; denies constipation, diarrhea, nausea, and vomiting. MS:  Complains of joint pain, low back pain, mid back pain, and stiffness. Derm:  Denies itching and rash. Neuro:  Denies headaches, seizures, and sensation of  room spinning. Psych:  Complains of anxiety, depression, and mental problems; denies suicidal thoughts/plans, thoughts of violence, and unusual visions or sounds. Endo:  Denies excessive hunger, excessive thirst, heat intolerance, and polyuria. Heme:  Denies abnormal bruising and bleeding. Allergy:  Complains of seasonal allergies; denies hives or rash and itching eyes.  Physical Exam  General:  Well-developed,well-nourished,in no acute distress; alert,appropriate and cooperative throughout examination HEENT: No facial asymmetry,  EOMI, No sinus tenderness, TM's Clear, oropharynx  pink and moist.   Chest: Clear to auscultation bilaterally.  CVS: S1, S2, No murmurs, No S3.   Abd: Soft, Nontender.  MS: Adequate ROM spine, hips, shoulders and knees.  Ext: No edema.     CNS: CN 2-12 intact, power tone and sensation normal throughout.   Skin: Intact, no visible lesions or rashes.  Psych: Good eye contact, normal affect.  Memory intact, not anxious or depressed appearing.    Impression & Recommendations:  Problem # 1:  ACUTE CYSTITIS (ICD-595.0) Assessment Comment Only  The following medications were removed from the medication list:    Ciprofloxacin Hcl 500 Mg Tabs (Ciprofloxacin hcl) .Marland Kitchen... Take 1 tablet by mouth two times a day  Orders: Urinalysis [id number]) T-Culture, Urine [id number])  Problem # 2:  OBESITY, UNSPECIFIED (ICD-278.00) Assessment: Improved  Ht: 66.5 (02/07/2010)   Wt: 199.75 (02/07/2010)   BMI: 31.87 (02/07/2010)  Problem # 3:  DIABETES MELLITUS, TYPE II (ICD-250.00) Assessment: Unchanged  Her updated medication list for this problem includes:    Metformin Hcl 500 Mg Tabs (Metformin hcl) .Marland Kitchen... Take  two  tablet by mouth two times a day    Aspirin 81 Mg Tabs (Aspirin) .Marland Kitchen... As directed  Labs Reviewed: Creat: 1.24 (01/24/2010)    Reviewed HgBA1c results: 6.8 (01/25/2010)  6.3 (08/26/2009)  Problem # 4:  ANXIETY DISORDER (ICD-300.00) Assessment: Deteriorated  Her updated medication list for this problem includes:    Xanax 0.5 Mg Tabs (Alprazolam) .Marland Kitchen... Take 1 tablet by mouth two times a day    Paroxetine Hcl 40 Mg Tabs (Paroxetine hcl) .Marland Kitchen... Take 1 tablet by mouth once a day  Problem # 5:  HYPERLIPIDEMIA (ICD-272.4) Assessment: Comment Only  Her updated medication list for this problem includes:    Lovastatin 40 Mg Tabs (Lovastatin) ..... One tab by mouth qd  Labs Reviewed: SGOT: 11 (05/09/2009)   SGPT: 9 (05/09/2009)  Prior 10 Yr Risk Heart Disease: 11 % (04/21/2009)   HDL:45 (08/26/2009), 42 (05/09/2009)  LDL:64 (08/26/2009), 102 (05/09/2009)  Chol:141 (08/26/2009), 179 (05/09/2009)  Trig:158 (08/26/2009), 177 (05/09/2009)  Problem # 6:  HYPERTENSION (ICD-401.9) Assessment: Improved  Her updated  medication list for this problem includes:    Amlodipine Besylate 10 Mg Tabs (Amlodipine besylate) .Marland Kitchen... Take one tab by mouth once daily  BP today: 124/90 Prior BP: 90/70 (01/24/2010)  Prior 10 Yr Risk Heart Disease: 11 % (04/21/2009)  Labs Reviewed: K+: 4.4 (01/24/2010) Creat: : 1.24 (01/24/2010)   Chol: 141 (08/26/2009)   HDL: 45 (08/26/2009)   LDL: 64 (08/26/2009)   TG: 158 (08/26/2009)  Complete Medication List: 1)  Metformin Hcl 500 Mg Tabs (Metformin hcl) .... Take  two  tablet by mouth two times a day 2)  Xanax 0.5 Mg Tabs (Alprazolam) .... Take 1 tablet by mouth two times a day 3)  Potassium Citrate 540 Mg Tbcr (Potassium citrate) .... Take 1 tablet by mouth two times a day 4)  Omeprazole 20 Mg Tbec (Omeprazole) .... Take 1 tablet by mouth twice a day 5)  Amlodipine  Besylate 10 Mg Tabs (Amlodipine besylate) .... Take one tab by mouth once daily 6)  Aspirin 81 Mg Tabs (Aspirin) .... As directed 7)  Tramadol Hcl 50 Mg Tabs (Tramadol hcl) .... Take 1 tab bid 8)  Lovastatin 40 Mg Tabs (Lovastatin) .... One tab by mouth qd 9)  Tylenol 325 Mg Tabs (Acetaminophen) .... 2 at bedtime 10)  Paroxetine Hcl 40 Mg Tabs (Paroxetine hcl) .... Take 1 tablet by mouth once a day 11)  Antivert 12.5 Mg Tabs (Meclizine hcl) .... Take 1 tablet by mouth three times a day as needed for dizziness 12)  Antivert 12.5 Mg Tabs (Meclizine hcl) .... Take 1 tablet by mouth three times a day as needed for dizziness  Patient Instructions: 1)  F/U in 6 weeks 2)  Pls try and eat more, and ensure that you drink at least 48 ounces of water daily. 3)  CCUA today, no abx if no infection, I will also call Dr Michela Pitcher Prescriptions: LOVASTATIN 40 MG TABS (LOVASTATIN) one tab by mouth qd  #30 Tablet x 2   Entered by:   Baldomero Lamy LPN   Authorized by:   Tula Nakayama MD   Signed by:   Baldomero Lamy LPN on [id number]   Method used:   Electronically to        [facility]. [phone]* (retail)       [address]        [place], [place]  [address]       Ph: [phone] or [phone]       Fax: [phone]   RxID:   [id number]   Laboratory Results   Urine Tests  Date/Time Received: February 07, 2010 11:24 AM  Date/Time Reported: February 07, 2010 11:25 AM   Routine Urinalysis   Color: yellow Appearance: Clear Glucose: negative   (Normal Range: Negative) Bilirubin: negative   (Normal Range: Negative) Ketone: negative   (Normal Range: Negative) Spec. Gravity: 1.025   (Normal Range: 1.003-1.035) Blood: moderate   (Normal Range: Negative) pH: 5.5   (Normal Range: 5.0-8.0) Protein: 100   (Normal Range: Negative) Urobilinogen: 0.2   (Normal Range: 0-1) Nitrite: negative   (Normal Range: Negative) Leukocyte Esterace: large   (Normal Range: Negative)

## 2010-07-11 NOTE — Progress Notes (Signed)
Summary: meds  Phone Note Call from Patient   Summary of Call: needs a new back pill. they are taking the tablet off market that she is taking now. [phone] Initial call taken by: Lenn Cal,  May 03, 2009 11:14 AM  Follow-up for Phone Call        pls advise tramadol has been sent in Follow-up by: Tula Nakayama MD,  May 03, 2009 1:18 PM  Additional Follow-up for Phone Call Additional follow up Details #1::        pt was called and notified Additional Follow-up by: Lenn Cal,  May 03, 2009 3:26 PM    New/Updated Medications: TRAMADOL HCL 50 MG TABS (TRAMADOL HCL) Take 1 tab by mouth at bedtime Prescriptions: TRAMADOL HCL 50 MG TABS (TRAMADOL HCL) Take 1 tab by mouth at bedtime  #30 x 2   Entered and Authorized by:   Tula Nakayama MD   Signed by:   Tula Nakayama MD on 05/03/2009   Method used:   Electronically to        [facility]. [phone]* (retail)       [address]       [place], [place]  [address]       Ph: [phone] or [phone]       Fax: [phone]   RxID:   [id number]

## 2010-07-11 NOTE — Letter (Signed)
[facility] [employer]  [employer]   Imported By: Dierdre Harness 12/27/2009 14:38:23  _____________________________________________________________________  External Attachment:    Type:   Image     Comment:   External Document

## 2010-07-11 NOTE — Letter (Signed)
Summary: Recall Office Visit  [facility]  [address]   [place], [place] [address]   Phone: [phone]  Fax: [phone]      December 02, 2009   Cheryl Abbott [address] [place], [place]  [address] January 13, 1946   Dear Ms. Wix,   According to our records, it is time for you to schedule a follow-up office visit with [place].   At your convenience, please call [phone] to schedule an office visit. If you have any questions, concerns, or feel that this letter is in error, we would appreciate your call.   Sincerely,    [facility] Ph: [phone]   Fax: [phone]

## 2010-07-11 NOTE — Miscellaneous (Signed)
Summary: refill  Clinical Lists Changes  Medications: Rx of DARVOCET-N 100 100-650 MG  TABS (PROPOXYPHENE N-APAP) Take 1 tablet by mouth once a day;  #30 x 1;  Signed;  Entered by: Jimmey Ralph LPN;  Authorized by: Tula Nakayama MD;  Method used: Printed then faxed to [facility]. [phone]*, [address], [place], [place], [place]  [address], Ph: [phone] or [phone], Fax: [phone]    Prescriptions: DARVOCET-N 100 100-650 MG  TABS (PROPOXYPHENE N-APAP) Take 1 tablet by mouth once a day  #30 x 1   Entered by:   Jimmey Ralph LPN   Authorized by:   Tula Nakayama MD   Signed by:   Jimmey Ralph LPN on [id number]   Method used:   Printed then faxed [facility] ...       [facility]  [address] [phone][facility] ([facility]ail)       [address]       [place],   [address]       Ph: [phone] or [phone]       Fax: [phone]   RxID:   [id number]

## 2010-07-11 NOTE — Letter (Signed)
Summary: HANDICAPP CARD  HANDICAPP CARD   Imported By: Dierdre Harness 09/14/2009 08:48:43  _____________________________________________________________________  External Attachment:    Type:   Image     Comment:   External Document

## 2010-07-11 NOTE — Progress Notes (Signed)
Summary: please advise  Phone Note Call from Patient   Summary of Call: Patient needs a referral for home health, she just got out of the hospital.  she needs a nurse to look at her insession and assist with a bath.   This can be faxed to medicare and medicaid patient states you would have the number.  Please advise. [facility] [phone] TUBE IS [place] Initial call taken by: Eliezer Mccoy,  March 30, 2010 3:17 PM  Follow-up for Phone Call        since the tube is leaking and the need is based on recent surgery she needs to call the surgeon so he can be supervising the home health Follow-up by: Tula Nakayama MD,  March 31, 2010 10:38 AM  Additional Follow-up for Phone Call Additional follow up Details #1::        patient aware Additional Follow-up by: Baldomero Lamy LPN,  October 21, 624THL 3:15 PM

## 2010-07-11 NOTE — Miscellaneous (Signed)
Summary: flu shot  Clinical Lists Changes  Observations: Added new observation of FLU VAX: Historical (03/17/2009 15:23)      Influenza Immunization History:    Influenza # 1:  Historical (03/17/2009)

## 2010-07-11 NOTE — Progress Notes (Signed)
Summary: NERVES  Phone Note Call from Patient   Summary of Call: LOST HER MOTHER  AND HER FAMILY IS COMING TO HER HOUSE  NEEDS SOMETHING FOR HER NERVES AND PAXIL IS DOING NO GOOD  WANTS TO KNOW ABOUT SOME  VALIUMS  [facility] [place]   PLEASE CALL IN TODAY SHE IS HAVING A HARD TIME CALL BACK AND LET HER KNOW AT [id number] Initial call taken by: Dierdre Harness,  November 21, 2009 3:24 PM  Follow-up for Phone Call        tell her sorry to hear. For 2 weeks she can take 2 extera xanax at bedtime to help with rest I have prescribed , pls send to her pharmacy Follow-up by: Tula Nakayama MD,  November 21, 2009 4:58 PM  Additional Follow-up for Phone Call Additional follow up Details #1::        patient aware Additional Follow-up by: Dierdre Harness,  November 22, 2009 9:01 AM    New/Updated Medications: ALPRAZOLAM 0.5 MG TABS (ALPRAZOLAM) Take 1 tablet by mouth two times a day and two tablets at bedtime as needed Prescriptions: ALPRAZOLAM 0.5 MG TABS (ALPRAZOLAM) Take 1 tablet by mouth two times a day and two tablets at bedtime as needed  #56 x 0   Entered and Authorized by:   Tula Nakayama MD   Signed by:   Tula Nakayama MD on 11/21/2009   Method used:   Printed then faxed [facility] ...       [facility]  [address] [phone][facility] (retail)       [address]       [place], [place]  [address]       Ph: [phone] or [phone]       Fax: [phone]   RxID:   [id number]

## 2010-07-11 NOTE — Progress Notes (Signed)
Summary: LITTLE TOE  Phone Note Call from Patient   Summary of Call: THINKS SHE BROKE HER LITTLE TOE  RIGHT FOOT AND WANST A REFFERRAL TO DR. HARRISON CALL BACK AT [phone]  TO LET HER KNOW Initial call taken by: Dierdre Harness,  May 09, 2009 9:18 AM  Follow-up for Phone Call        pls refer to Dr. Jolee Ewing eval toe pain, possible fracture, see if you can getr a date and time or let her know they will call Follow-up by: Tula Nakayama MD,  May 09, 2009 12:40 PM  Additional Follow-up for Phone Call Additional follow up Details #1::        faxed over information to [facility] office. they will call pt with appt. pt notified Additional Follow-up by: Lenn Cal,  May 10, 2009 11:02 AM

## 2010-07-11 NOTE — Progress Notes (Signed)
Summary: medicine  Phone Note Call from Patient   Summary of Call: pt states darvocet wasn't sent into pharm. [id number] Initial call taken by: Lenn Cal,  December 29, 2008 3:24 PM    Prescriptions: DARVOCET-N 100 100-650 MG  TABS (PROPOXYPHENE N-APAP) Take 1 tablet by mouth once a day  #30 x 3   Entered by:   Kate Sable   Authorized by:   Tula Nakayama MD   Signed by:   Kate Sable on 12/29/2008   Method used:   Printed then faxed to ...       [facility]  [address] [phone]* (retail)       [address]       [place],   [address]       Ph: [phone] or [phone]       Fax: [phone]   RxID:   [id number]

## 2010-07-11 NOTE — Progress Notes (Signed)
Summary: MEDS  Phone Note Call from Patient   Summary of Call: NEEDS HER ALPRAZOLAM AND HER TRAMADOL [facility] Initial call taken by: Dierdre Harness,  August 01, 2009 11:33 AM  Follow-up for Phone Call        Rx Called In Follow-up by: Baldomero Lamy LPN,  February 21, 624THL 3:21 PM    Prescriptions: TRAMADOL HCL 50 MG TABS (TRAMADOL HCL) Take 1 tab by mouth at bedtime  #30 Tablet x 2   Entered by:   Baldomero Lamy LPN   Authorized by:   Tula Nakayama MD   Signed by:   Baldomero Lamy LPN on [id number]   Method used:   Printed then faxed to ...       [facility]  [address] [phone]* (retail)       [address]       [place], [place]  [address]       Ph: [phone] or [phone]       Fax: [phone]   RxID:   [id number] Duanne Moron 0.5 MG  TABS (ALPRAZOLAM) Take 1 tablet by mouth two times a day  #60 x 2   Entered by:   Baldomero Lamy LPN   Authorized by:   Tula Nakayama MD   Signed by:   Baldomero Lamy LPN on [id number]   Method used:   Printed then faxed to ...       [facility]  [address] [phone]* (retail)       [address]       [place], [place]  [address]       Ph: [phone] or [phone]       Fax: [phone]   RxID:   [id number]

## 2010-07-11 NOTE — Progress Notes (Signed)
Summary: letter  Phone Note Call from Patient   Summary of Call: needs for doc to write a letter excusing her from jury duty. [phone] back pain  Initial call taken by: Lenn Cal,  May 30, 2009 10:34 AM  Follow-up for Phone Call        pls type a letter stating this I will signcopy in your box Follow-up by: Tula Nakayama MD,  May 30, 2009 12:19 PM  Additional Follow-up for Phone Call Additional follow up Details #1::        pt came by office and picked up her note for court. I gave her a copy and scanned the other Additional Follow-up by: Lenn Cal,  May 31, 2009 3:13 PM

## 2010-07-11 NOTE — Assessment & Plan Note (Signed)
Summary: uti?- room 1   Vital Signs:  Patient profile:   65 year old female Menstrual status:  hysterectomy Height:      66.5 inches Weight:      204.75 pounds BMI:     32.67 O2 Sat:      96 % on Room air Pulse rate:   109 / minute Resp:     16 per minute BP sitting:   110 / 70  (left arm)  Vitals Entered By: Baldomero Lamy LPN (May  3, 624THL [id number] PM) CC: uti? discomfort with urination Is Patient Diabetic? Yes Pain Assessment Patient in pain? no        Referring Provider:  same Primary Provider:  Moshe Cipro, M.D.  CC:  uti? discomfort with urination.  History of Present Illness: Pt is here today feeling like she has another UTI.  She has been having recurrent UTI's, and has an appt with urologist 10-18-09.No fever or chills.  Cipro works best for her.  She states though that about 4-5 days after finishing Cipro prescription her syptoms start to rtn.  Mother terminally ill with Alzheimers.  Also had a stroke & unable to swallow now.  She lives in [place], [place] has been called in.  Pt states she needs something to help with her nerves thru this. Her mother is not expected to live very long, and she has a cousin who is going todrive her up to spend a few days with her mother tomorrow. It is very sad & stressful when she does visit her mother becuase of her Alzheimers and she is combative besides not remembering pt and her siblings. She is currently on Xanax & Paxil.  Would like prescription for Valium to use instead of Xanax.  She states the Xanax is not strong enough to get her thru a funeral.    Current Medications (verified): 1)  Metformin Hcl 500 Mg  Tabs (Metformin Hcl) .... Take  Two  Tablet By Mouth Two Times A Day 2)  Xanax 0.5 Mg  Tabs (Alprazolam) .... Take 1 Tablet By Mouth Two Times A Day 3)  Paroxetine Hcl 20 Mg  Tabs (Paroxetine Hcl) .... Take 1 Tablet By Mouth Once A Day 4)  Potassium Citrate 540 Mg  Tbcr (Potassium Citrate) .... Take 1 Tablet By Mouth Two Times  A Day 5)  Omeprazole 20 Mg  Tbec (Omeprazole) .... Take 1 Tablet By Mouth Twice A Day 6)  Amlodipine Besylate 10 Mg Tabs (Amlodipine Besylate) .... Take One Tab By Mouth Once Daily 7)  Aspirin 81 Mg Tabs (Aspirin) .... As Directed 8)  Tramadol Hcl 50 Mg Tabs (Tramadol Hcl) .... Take 1 Tab Bid 9)  Lovastatin 40 Mg Tabs (Lovastatin) .... One Tab By Mouth Qd 10)  Tylenol 325 Mg Tabs (Acetaminophen) .... 2 At Bedtime 11)  Ciprofloxacin Hcl 500 Mg Tabs (Ciprofloxacin Hcl) .... Take 1 Tablet By Mouth Two Times A Day  Allergies (verified): 1)  ! Penicillin 2)  ! Macrobid  Past History:  Past medical history reviewed for relevance to current acute and chronic problems.  Past Medical History: Reviewed history from 05/17/2009 and no changes required. Current Problems:  ANXIETY DISORDER (ICD-300.00) BACK PAIN (ICD-724.5) DIABETES MELLITUS, TYPE I (ICD-250.01) HYPERLIPIDEMIA (ICD-272.4) HYPERTENSION (ICD-401.9) SLEEP APNEA Vein disease  Review of Systems General:  Denies chills and fever. GI:  Complains of abdominal pain; denies change in bowel habits; LOWER ABD PAIN WITH UTI SXS, RESOLVES WHEN INFECTION/SXS RESOLVE. GU:  Complains of dysuria, nocturia,  and urinary frequency. Psych:  Complains of anxiety, depression, and easily tearful.  Physical Exam  General:  Well-developed,well-nourished,in no acute distress; alert,appropriate and cooperative throughout examination Head:  Normocephalic and atraumatic without obvious abnormalities. No apparent alopecia or balding. Ears:  External ear exam shows no significant lesions or deformities.  Otoscopic examination reveals clear canals, tympanic membranes are intact bilaterally without bulging, retraction, inflammation or discharge. Hearing is grossly normal bilaterally. Nose:  External nasal examination shows no deformity or inflammation. Nasal mucosa are pink and moist without lesions or exudates. Mouth:  Oral mucosa and oropharynx without  lesions or exudates.  Teeth in good repair. Neck:  No deformities, masses, or tenderness noted. Lungs:  Normal respiratory effort, chest expands symmetrically. Lungs are clear to auscultation, no crackles or wheezes. Heart:  Normal rate and regular rhythm. S1 and S2 normal without gallop, murmur, click, rub or other extra sounds. Abdomen:  soft, normal bowel sounds, and no masses.  +TTP suprapubic region. Neurologic:  alert & oriented X3 and gait normal.   Cervical Nodes:  No lymphadenopathy noted Psych:  Oriented X3, normally interactive, good eye contact, and tearful when discussing mother.   Impression & Recommendations:  Problem # 1:  ACUTE CYSTITIS (ICD-595.0) Assessment New  Her updated medication list for this problem includes:    Ciprofloxacin Hcl 500 Mg Tabs (Ciprofloxacin hcl) .Marland Kitchen... Take 1 tablet by mouth two times a day  Orders: Urinalysis [id number])  Problem # 2:  ANXIETY DISORDER (ICD-300.00) Assessment: Deteriorated Discussed with pt that I would rather she try increasing her Xanax to 2 tabs two times a day when needed, instead of 1.   I have given her an add'l 30 tabs to accommodate the increase use over the next few weeks. Her updated medication list for this problem includes:    Xanax 0.5 Mg Tabs (Alprazolam) .Marland Kitchen... Take 1 tablet by mouth two times a day    Paroxetine Hcl 20 Mg Tabs (Paroxetine hcl) .Marland Kitchen... Take 1 tablet by mouth once a day  Complete Medication List: 1)  Metformin Hcl 500 Mg Tabs (Metformin hcl) .... Take  two  tablet by mouth two times a day 2)  Xanax 0.5 Mg Tabs (Alprazolam) .... Take 1 tablet by mouth two times a day 3)  Paroxetine Hcl 20 Mg Tabs (Paroxetine hcl) .... Take 1 tablet by mouth once a day 4)  Potassium Citrate 540 Mg Tbcr (Potassium citrate) .... Take 1 tablet by mouth two times a day 5)  Omeprazole 20 Mg Tbec (Omeprazole) .... Take 1 tablet by mouth twice a day 6)  Amlodipine Besylate 10 Mg Tabs (Amlodipine besylate) .... Take one  tab by mouth once daily 7)  Aspirin 81 Mg Tabs (Aspirin) .... As directed 8)  Tramadol Hcl 50 Mg Tabs (Tramadol hcl) .... Take 1 tab bid 9)  Lovastatin 40 Mg Tabs (Lovastatin) .... One tab by mouth qd 10)  Tylenol 325 Mg Tabs (Acetaminophen) .... 2 at bedtime 11)  Ciprofloxacin Hcl 500 Mg Tabs (Ciprofloxacin hcl) .... Take 1 tablet by mouth two times a day  Patient Instructions: 1)  Please schedule a follow-up appointment as needed. 2)  Keep your appt with urologist. 3)  I have prescribed Cipro for your urinary infection. 4)  I have given you extra Xanax to use as needed during this stressful time. You may take 2 tablets as needed for anxiety. Prescriptions: XANAX 0.5 MG  TABS (ALPRAZOLAM) Take 1 tablet by mouth two times a day  #30 x 0  Entered and Authorized by:   Kennith Gain PA   Signed by:   Kennith Gain PA on 10/11/2009   Method used:   Printed then [facility] ...       [facility]  [address] [phone]* (retail)       [address]       [place], [place]  [address]       Ph: [phone] or [phone]       Fax: [phone]   RxID:   [id number] CIPROFLOXACIN HCL 500 MG TABS (CIPROFLOXACIN HCL) Take 1 tablet by mouth two times a day  #14 x 1   Entered and Authorized by:   Kennith Gain PA   Signed by:   Kennith Gain PA on 10/11/2009   Method used:   Electronically to        [facility] [phone]* (retail)       [address]       [place], [place]  [address]       Ph: [phone] or [phone]       Fax: [phone]   RxID:   [id number]   Appended Document: uti?- room 1  Laboratory Results   Urine Tests  Date/Time Received: Oct 11, 2009 2:58 PM  Date/Time Reported: Oct 11, 2009 2:58 PM   Routine Urinalysis   Color: yellow Appearance: Cloudy Glucose: negative   (Normal Range: Negative) Bilirubin: negative   (Normal Range: Negative) Ketone: negative   (Normal Range: Negative) Spec. Gravity: 1.020   (Normal Range:  1.003-1.035) Blood: large   (Normal Range: Negative) pH: 5.5   (Normal Range: 5.0-8.0) Protein: >=300   (Normal Range: Negative) Urobilinogen: 0.2   (Normal Range: 0-1) Nitrite: negative   (Normal Range: Negative) Leukocyte Esterace: large   (Normal Range: Negative)

## 2010-07-11 NOTE — Letter (Signed)
Summary: office notes  office notes   Imported By: Dierdre Harness 11/01/2009 14:53:50  _____________________________________________________________________  External Attachment:    Type:   Image     Comment:   External Document

## 2010-07-11 NOTE — Progress Notes (Signed)
Summary: right side pain  Phone Note Call from Patient   Summary of Call: everytime she eats hurting under her right breast and going down right side. [id number] Initial call taken by: Lenn Cal,  Oct 21, 2008 4:27 PM  Follow-up for Phone Call        advised if pain persists or gets worse to go to er Follow-up by: Jimmey Ralph LPN,  May 13, 624THL 624THL PM

## 2010-07-11 NOTE — Letter (Signed)
Summary: TRANSFERRED RECORDS  TRANSFERRED RECORDS   Imported By: Dierdre Harness 03/09/2010 16:29:55  _____________________________________________________________________  External Attachment:    Type:   Image     Comment:   External Document

## 2010-07-11 NOTE — Progress Notes (Signed)
  Phone Note Call from Patient   Summary of Call: [facility] called and wanted a verbal order on a cane for patient. I wasn't sure if I could do that so I asked them to send a request Initial call taken by: Kate Sable LPN,  August  5, 624THL 2:20 PM  Follow-up for Phone Call        I am not aware that she has bad problems walking, any recent knee surgeryu/?/ why does she need a cane? Follow-up by: Tula Nakayama MD,  January 17, 2010 4:44 PM  Additional Follow-up for Phone Call Additional follow up Details #1::        They faxed a paper explaining it and I put it in your box last week Additional Follow-up by: Kate Sable LPN,  August 11, 624THL 9:01 AM

## 2010-07-11 NOTE — Miscellaneous (Signed)
Summary: FL 2   FL 2   Imported By: Dierdre Harness 01/26/2010 08:07:19  _____________________________________________________________________  External Attachment:    Type:   Image     Comment:   External Document

## 2010-07-11 NOTE — Assessment & Plan Note (Signed)
Summary: urinary symptoms  Nurse Visit              Comments patient complains of buring with urination - submitted urine specimen for ccua     Prior Medications: METFORMIN HCL 500 MG  TABS (METFORMIN HCL) Take 1 tablet by mouth two times a day XANAX 0.5 MG  TABS (ALPRAZOLAM) Take 1 tablet by mouth two times a day PAROXETINE HCL 20 MG  TABS (PAROXETINE HCL) Take 1 tablet by mouth once a day LOVASTATIN 20 MG  TABS (LOVASTATIN) Take 1 tab by mouth at bedtime POTASSIUM CITRATE 540 MG  TBCR (POTASSIUM CITRATE) Take 1 tablet by mouth two times a day OMEPRAZOLE 20 MG  TBEC (OMEPRAZOLE) Take 1 tablet by mouth once a day DARVOCET-N 100 100-650 MG  TABS (PROPOXYPHENE N-APAP) Take 1 tablet by mouth once a day LEVAQUIN 500 MG  TABS (LEVOFLOXACIN) Take 1 tablet by mouth once a day DIFLUCAN 100 MG  TABS (FLUCONAZOLE) Take 1 tablet by mouth once a day as needed Current Allergies: ! PENICILLIN Laboratory Results   Urine Tests  Date/Time Received: 01/26/08 3:30pm Date/Time Reported: 01/26/08 3:30pm  Routine Urinalysis   Color: yellow Appearance: Cloudy Glucose: negative   (Normal Range: Negative) Bilirubin: negative   (Normal Range: Negative) Ketone: negative   (Normal Range: Negative) Spec. Gravity: 1.020   (Normal Range: 1.003-1.035) Blood: negative   (Normal Range: Negative) pH: 5.5   (Normal Range: 5.0-8.0) Protein: negative   (Normal Range: Negative) Urobilinogen: 0.2   (Normal Range: 0-1) Nitrite: negative   (Normal Range: Negative) Leukocyte Esterace: negative   (Normal Range: Negative)           ] Laboratory Results   Urine Tests    Routine Urinalysis   Color: yellow Appearance: Cloudy Glucose: negative   (Normal Range: Negative) Bilirubin: negative   (Normal Range: Negative) Ketone: negative   (Normal Range: Negative) Spec. Gravity: 1.020   (Normal Range: 1.003-1.035) Blood: negative   (Normal Range: Negative) pH: 5.5   (Normal Range: 5.0-8.0) Protein:  negative   (Normal Range: Negative) Urobilinogen: 0.2   (Normal Range: 0-1) Nitrite: negative   (Normal Range: Negative) Leukocyte Esterace: negative   (Normal Range: Negative)       Appended Document: urinary symptoms

## 2010-07-11 NOTE — Miscellaneous (Signed)
Summary: Immunizations  Immunizations   Imported By: Dierdre Harness 03/01/2010 12:57:56  _____________________________________________________________________  External Attachment:    Type:   Image     Comment:   External Document

## 2010-07-11 NOTE — Assessment & Plan Note (Signed)
Summary: uti symptoms -room 3   Vital Signs:  Patient profile:   65 year old female Menstrual status:  hysterectomy Height:      66.5 inches Weight:      210.75 pounds BMI:     33.63 O2 Sat:      95 % on Room air Pulse rate:   99 / minute Resp:     16 per minute BP sitting:   120 / 82  (left arm)  Vitals Entered By: Baldomero Lamy LPN (February 23, 624THL 2:21 PM)  Nutrition Counseling: Patient's BMI is greater than 25 and therefore counseled on weight management options. CC: burning and pressure at end of urine flow, increased frequency Is Patient Diabetic? Yes Pain Assessment Patient in pain? no        Primary Provider:  Moshe Cipro, M.D.  CC:  burning and pressure at end of urine flow and increased frequency.  History of Present Illness: Pt is here today due to recurrence of UTI syptoms.  She states that about 5 days after she finished her Cipro Rx that her syptoms returned.  She is having burning, urgency & frequency. Pt states that she knew the infection would come back again so she would skip some doses of her antibiotics to try to space it out & make it last longer.  Also has a lump on the Rt side of her scalp that she noticed recently. No tenderness to touch or pain.  No change of size.  Also has chronic LBP.  Is requesting physical therapy, states that this has helped her in the past.  The Ultram that she is taking at HS helps alot & she is able to sleep well, but still has alot of daytime pain.  Pt has a hx of DM & HTN.  She is taking her meds regularly & has no complaints/concerns regarding.  She is overdue for her diabetic eye exam & knows that she needs to schedule one but wants to check on ins coverage first.    Current Medications (verified): 1)  Metformin Hcl 500 Mg  Tabs (Metformin Hcl) .... Take  Two  Tablet By Mouth Two Times A Day 2)  Xanax 0.5 Mg  Tabs (Alprazolam) .... Take 1 Tablet By Mouth Two Times A Day 3)  Paroxetine Hcl 20 Mg  Tabs (Paroxetine Hcl) ....  Take 1 Tablet By Mouth Once A Day 4)  Potassium Citrate 540 Mg  Tbcr (Potassium Citrate) .... Take 1 Tablet By Mouth Two Times A Day 5)  Omeprazole 20 Mg  Tbec (Omeprazole) .... Take 1 Tablet By Mouth Twice A Day 6)  Amlodipine Besylate 10 Mg Tabs (Amlodipine Besylate) .... Take One Tab By Mouth Once Daily 7)  Aspirin 81 Mg Tabs (Aspirin) .... As Directed 8)  Metformin Hcl 1000 Mg Tabs (Metformin Hcl) .... Two Times A Day 9)  Tramadol Hcl 50 Mg Tabs (Tramadol Hcl) .... Take 1 Tab By Mouth At Bedtime 10)  Lovastatin 40 Mg Tabs (Lovastatin) .... One Tab By Mouth Qd 11)  Tylenol 325 Mg Tabs (Acetaminophen) .... 2 At Bedtime 12)  Ciprofloxacin Hcl 500 Mg Tabs (Ciprofloxacin Hcl) .... Take 1 Tablet By Mouth Two Times A Day  Allergies (verified): 1)  ! Penicillin PMH-FH-SH reviewed for relevance  Review of Systems General:  Complains of fatigue; denies chills and fever. ENT:  Denies earache and nasal congestion. CV:  Complains of swelling of feet; denies chest pain or discomfort and palpitations. Resp:  Denies cough and shortness of  breath. GI:  Denies abdominal pain, nausea, and vomiting. GU:  Complains of dysuria and urinary frequency. MS:  Complains of low back pain. Neuro:  Denies numbness and tingling. Endo:  Denies excessive thirst. Heme:  Denies enlarge lymph nodes and fevers.  Physical Exam  General:  Well-developed,well-nourished,in no acute distress; alert,appropriate and cooperative throughout examination Head:  Normocephalic and atraumatic without obvious abnormalities. No apparent alopecia or balding. There is a palpable soft, fluctuant mass Rt parietal area approx 2"x3".  Nontender Ears:  External ear exam shows no significant lesions or deformities.  Otoscopic examination reveals clear canals, tympanic membranes are intact bilaterally without bulging, retraction, inflammation or discharge. Hearing is grossly normal bilaterally. Nose:  External nasal examination shows no  deformity or inflammation. Nasal mucosa are pink and moist without lesions or exudates. Mouth:  Oral mucosa and oropharynx without lesions or exudates.  Teeth in good repair. Neck:  No deformities, masses, or tenderness noted. Lungs:  Normal respiratory effort, chest expands symmetrically. Lungs are clear to auscultation, no crackles or wheezes. Heart:  Normal rate and regular rhythm. S1 and S2 normal without gallop, murmur, click, rub or other extra sounds. Abdomen:  Bowel sounds positive,abdomen soft and non-tender without masses, organomegaly or hernias noted. Pulses:  R and L carotid,radial,femoral,dorsalis pedis and posterior tibial pulses are full and equal bilaterally Extremities:  No clubbing, cyanosis, edema, or deformity noted with normal full range of motion of all joints.   Skin:  Intact without suspicious lesions or rashes Cervical Nodes:  No lymphadenopathy noted Psych:  Cognition and judgment appear intact. Alert and cooperative with normal attention span and concentration. No apparent delusions, illusions, hallucinations   Impression & Recommendations:  Problem # 1:  ACUTE CYSTITIS (ICD-595.0)  Her updated medication list for this problem includes:    Ciprofloxacin Hcl 500 Mg Tabs (Ciprofloxacin hcl) .Marland Kitchen... Take 1 tablet by mouth two times a day  Orders: UA Dipstick W/ Micro (manual) (81000) T-Culture, Urine [id number])  Pt referred back to urologist for Recurrent UTI's.  Problem # 2:  HYPERTENSION (ICD-401.9)  Her updated medication list for this problem includes:    Amlodipine Besylate 10 Mg Tabs (Amlodipine besylate) .Marland Kitchen... Take one tab by mouth once daily  BP today: 120/82 Prior BP: 120/70 (07/04/2009)  Prior 10 Yr Risk Heart Disease: 11 % (04/21/2009)  Labs Reviewed: K+: 5.6 (05/09/2009) Creat: : 1.05 (05/09/2009)   Chol: 179 (05/09/2009)   HDL: 42 (05/09/2009)   LDL: 102 (05/09/2009)   TG: 177 (05/09/2009)  Her updated medication list for this problem  includes:    Amlodipine Besylate 10 Mg Tabs (Amlodipine besylate) .Marland Kitchen... Take one tab by mouth once daily  Problem # 3:  DIABETES MELLITUS, TYPE II (ICD-250.00) Assessment: Unchanged  Her updated medication list for this problem includes:    Metformin Hcl 500 Mg Tabs (Metformin hcl) .Marland Kitchen... Take  two  tablet by mouth two times a day    Aspirin 81 Mg Tabs (Aspirin) .Marland Kitchen... As directed    Metformin Hcl 1000 Mg Tabs (Metformin hcl) .Marland Kitchen..Marland Kitchen Two times a day  Labs Reviewed: Creat: 1.05 (05/09/2009)    Reviewed HgBA1c results: 6.6 (04/21/2009)  6.5 (01/19/2009)  Problem # 4:  BACK PAIN (ICD-724.5) Assessment: Unchanged  Her updated medication list for this problem includes:    Aspirin 81 Mg Tabs (Aspirin) .Marland Kitchen... As directed    Tramadol Hcl 50 Mg Tabs (Tramadol hcl) .Marland Kitchen... Take 1 tab bid    Tylenol 325 Mg Tabs (Acetaminophen) .Marland Kitchen... 2 at bedtime  Advised pt to increase her ultram to twice daily.  Orders: Physical Therapy Referral (PT)  Problem # 5:  LIPOMA OF OTHER SKIN AND SUBCUTANEOUS TISSUE (ICD-214.1) Assessment: New Reassurance that this is a fatty cyst.   Consider referral for removal if change size, etc.  Complete Medication List: 1)  Metformin Hcl 500 Mg Tabs (Metformin hcl) .... Take  two  tablet by mouth two times a day 2)  Xanax 0.5 Mg Tabs (Alprazolam) .... Take 1 tablet by mouth two times a day 3)  Paroxetine Hcl 20 Mg Tabs (Paroxetine hcl) .... Take 1 tablet by mouth once a day 4)  Potassium Citrate 540 Mg Tbcr (Potassium citrate) .... Take 1 tablet by mouth two times a day 5)  Omeprazole 20 Mg Tbec (Omeprazole) .... Take 1 tablet by mouth twice a day 6)  Amlodipine Besylate 10 Mg Tabs (Amlodipine besylate) .... Take one tab by mouth once daily 7)  Aspirin 81 Mg Tabs (Aspirin) .... As directed 8)  Metformin Hcl 1000 Mg Tabs (Metformin hcl) .... Two times a day 9)  Tramadol Hcl 50 Mg Tabs (Tramadol hcl) .... Take 1 tab bid 10)  Lovastatin 40 Mg Tabs (Lovastatin) .... One tab by  mouth qd 11)  Tylenol 325 Mg Tabs (Acetaminophen) .... 2 at bedtime 12)  Ciprofloxacin Hcl 500 Mg Tabs (Ciprofloxacin hcl) .... Take 1 tablet by mouth two times a day  Other Orders: T-TSH [id number]) T- Hemoglobin A1C [id number]) T-Lipid Profile (123XX123) T-Basic Metabolic Panel (99991111) T-CBC No Diff [id number]) TLB-Microalbumin/Creat Ratio, Urine (82043-MALB) T-Vitamin D (25-Hydroxy) [id number])  Patient Instructions: 1)  Please schedule a follow-up appointment in 3 months. 2)  Increase your Tramadol to twice day. 3)  I ordered lab work for you, and physical therapy. 4)  We will refer you back to the urologist for your frequent urinary tract infections. Prescriptions: TRAMADOL HCL 50 MG TABS (TRAMADOL HCL) Take 1 tab bid  #60 x 2   Entered and Authorized by:   Kennith Gain PA   Signed by:   Kennith Gain PA on 08/03/2009   Method used:   Electronically to        [facility]. [phone]* (retail)       [address]       [place], [place]  [address]       Ph: [phone] or [phone]       Fax: [phone]   RxID:   [id number] CIPROFLOXACIN HCL 500 MG TABS (CIPROFLOXACIN HCL) Take 1 tablet by mouth two times a day  #14 x 0   Entered and Authorized by:   Kennith Gain PA   Signed by:   Kennith Gain PA on 08/03/2009   Method used:   Electronically to        [facility] [phone]* (retail)       [address]       [place], [place]  [address]       Ph: [phone] or [phone]       Fax: [phone]   RxID:   [id number]   Laboratory Results   Urine Tests  Date/Time Received: August 03, 2009  Date/Time Reported: August 03, 2009   Routine Urinalysis   Color: yellow Appearance: Clear Glucose: negative   (Normal Range: Negative) Bilirubin: negative   (Normal Range: Negative) Ketone: negative   (Normal Range: Negative) Spec. Gravity: 1.020   (Normal Range: 1.003-1.035)  Blood: large   (Normal Range:  Negative) pH: 5.5   (Normal Range: 5.0-8.0) Protein: >=300   (Normal Range: Negative) Urobilinogen: 0.2   (Normal Range: 0-1) Nitrite: negative   (Normal Range: Negative) Leukocyte Esterace: large   (Normal Range: Negative)        Appended Document: uti symptoms -room 3     Allergies: 1)  ! Penicillin  Diabetes Management Exam:    Foot Exam (with socks and/or shoes not present):       Sensory-Monofilament:          Left foot: normal          Right foot: normal       Inspection:          Left foot: normal          Right foot: normal       Nails:          Left foot: normal          Right foot: normal   Complete Medication List: 1)  Metformin Hcl 500 Mg Tabs (Metformin hcl) .... Take  two  tablet by mouth two times a day 2)  Xanax 0.5 Mg Tabs (Alprazolam) .... Take 1 tablet by mouth two times a day 3)  Paroxetine Hcl 20 Mg Tabs (Paroxetine hcl) .... Take 1 tablet by mouth once a day 4)  Potassium Citrate 540 Mg Tbcr (Potassium citrate) .... Take 1 tablet by mouth two times a day 5)  Omeprazole 20 Mg Tbec (Omeprazole) .... Take 1 tablet by mouth twice a day 6)  Amlodipine Besylate 10 Mg Tabs (Amlodipine besylate) .... Take one tab by mouth once daily 7)  Aspirin 81 Mg Tabs (Aspirin) .... As directed 8)  Metformin Hcl 1000 Mg Tabs (Metformin hcl) .... Two times a day 9)  Tramadol Hcl 50 Mg Tabs (Tramadol hcl) .... Take 1 tab bid 10)  Lovastatin 40 Mg Tabs (Lovastatin) .... One tab by mouth qd 11)  Tylenol 325 Mg Tabs (Acetaminophen) .... 2 at bedtime 12)  Ciprofloxacin Hcl 500 Mg Tabs (Ciprofloxacin hcl) .... Take 1 tablet by mouth two times a day

## 2010-07-11 NOTE — Assessment & Plan Note (Signed)
Summary: FOLLOW UP GERD- [facility]   Visit Type:  Follow-up Visit Primary Care Provider:  Moshe Cipro, M.D.  Chief Complaint:  follow up- gerd.  History of Present Illness: Had a big Thanksgiving dinner with family. Pain in right foot. Having sharp pains that would come and go. Haven't had that lately. Has GERD and takes meds two times a day. Intentional weight loss down to 199 lbs. Sweets are her weakness. BM: 2-3x/day, but since tramadol 2x/day.  Current Medications (verified): 1)  Metformin Hcl 500 Mg  Tabs (Metformin Hcl) .... Take  Two  Tablet By Mouth Two Times A Day 2)  Xanax 0.5 Mg  Tabs (Alprazolam) .... Take 1 Tablet By Mouth Two Times A Day 3)  Paroxetine Hcl 20 Mg  Tabs (Paroxetine Hcl) .... Take 1 Tablet By Mouth Once A Day 4)  Potassium Citrate 540 Mg  Tbcr (Potassium Citrate) .... Take 1 Tablet By Mouth Two Times A Day 5)  Omeprazole 20 Mg  Tbec (Omeprazole) .... Take 1 Tablet By Mouth Twice A Day 6)  Amlodipine Besylate 10 Mg Tabs (Amlodipine Besylate) .... Take One Tab By Mouth Once Daily 7)  Aspirin 81 Mg Tabs (Aspirin) .... As Directed 8)  Metformin Hcl 1000 Mg Tabs (Metformin Hcl) .... Two Times A Day 9)  Tramadol Hcl 50 Mg Tabs (Tramadol Hcl) .... Take 1 Tab By Mouth At Bedtime 10)  Lovastatin 40 Mg Tabs (Lovastatin) .... One Tab By Mouth Qd 11)  Tylenol 325 Mg Tabs (Acetaminophen) .... 2 At Bedtime  Allergies (verified): 1)  ! Penicillin  Past History:  Past Medical History: Last updated: 02/02/2008 Current Problems:  ANXIETY DISORDER (ICD-300.00) BACK PAIN (ICD-724.5) DIABETES MELLITUS, TYPE I (ICD-250.01) HYPERLIPIDEMIA (ICD-272.4) HYPERTENSION (ICD-401.9) SLEEP APNEA  Vital Signs:  Patient profile:   65 year old female Menstrual status:  hysterectomy Height:      66.5 inches Weight:      203 pounds BMI:     32.39 Temp:     98.1 degrees F oral Pulse rate:   76 / minute BP sitting:   128 / 82  (left arm) Cuff size:   regular  Vitals Entered By: Burnadette Peter LPN (December  1, 624THL 3:07 PM)  Physical Exam  General:  Well developed, well nourished, no acute distress. Head:  Normocephalic and atraumatic. Lungs:  Clear throughout to auscultation. Heart:  Regular rate and rhythm; no murmurs Abdomen:  Soft, nontender and nondistended. Normal bowel sounds.  Impression & Recommendations:  Problem # 1:  GERD (ICD-530.81) Assessment Unchanged Most likely cause for intermittent chest pain. Pt should acheive an additional 20 lb weight loss. Continue two times a day PPI. Low fat/diabetic diet. Avoid sweets and minimize soda. Eat popcorn after 8 pm if hungry. Avoid big meals after 8 PM. Agree with exercising 30 minutes a day 5 days a week. May occasionally have a diet soda. Dr. Moshe Cipro refills omeprazole. No indication for endoscopy. OPV in 6 mos.  Problem # 2:  COLONIC POLYPS, ADENOMATOUS, HX OF (ICD-V12.72) Assessment: Unchanged TCS between 2013 and 2018. High fiber diet.  CC: DR. Moshe Cipro  Appended Document: Orders Update-charge    Clinical Lists Changes  Orders: Added new Service order of Est. Patient Level II [id number]) - Signed      Appended Document: FOLLOW UP GERD- CDG PT AWARE OF APPT 07/18/2009 930AM.

## 2010-07-11 NOTE — Letter (Signed)
Summary: Discharge Summary  Discharge Summary   Imported By: Dierdre Harness 01/24/2010 09:16:26  _____________________________________________________________________  External Attachment:    Type:   Image     Comment:   External Document

## 2010-07-11 NOTE — Miscellaneous (Signed)
Summary: refill  Clinical Lists Changes  Medications: Rx of DARVOCET-N 100 100-650 MG  TABS (PROPOXYPHENE N-APAP) Take 1 tablet by mouth once a day;  #30 x 1;  Signed;  Entered by: Jimmey Ralph LPN;  Authorized by: Tula Nakayama MD;  Method used: Printed then faxed to [facility]. [phone]*, [address] [place], [place], [place]  [address], Ph: [phone] or [phone], Fax: [phone]    Prescriptions: DARVOCET-N 100 100-650 MG  TABS (PROPOXYPHENE N-APAP) Take 1 tablet by mouth once a day  #30 x 1   Entered by:   Jimmey Ralph LPN   Authorized by:   Tula Nakayama MD   Signed by:   Jimmey Ralph LPN on 624THL   Method used:   Printed then faxed to ...       [facility]  [address] [phone]* (retail)       [address]       [place], [place]  [address]       Ph: [phone] or [phone]       Fax: [phone]   RxID:   [id number]

## 2010-07-11 NOTE — Miscellaneous (Signed)
Summary: refill  Clinical Lists Changes  Medications: Rx of XANAX 0.5 MG  TABS (ALPRAZOLAM) Take 1 tablet by mouth two times a day;  #60 x 3;  Signed;  Entered by: Kate Sable;  Authorized by: Tula Nakayama MD;  Method used: Printed then faxed to [facility]. [phone]*, [address] [place], [place], [place]  [address], Ph: [phone] or [phone], Fax: [phone]    Prescriptions: XANAX 0.5 MG  TABS (ALPRAZOLAM) Take 1 tablet by mouth two times a day  #60 x 3   Entered by:   Kate Sable   Authorized by:   Tula Nakayama MD   Signed by:   Kate Sable on 04/08/2009   Method used:   Printed then faxed to ...       [facility]  [address] [phone]* (retail)       [address]       [place], [place]  [address]       Ph: [phone] or [phone]       Fax: [phone]   RxID:   [id number]

## 2010-07-11 NOTE — Letter (Signed)
Summary: hida scan  hida scan   Imported By: Lenn Cal 09/21/2009 10:22:38  _____________________________________________________________________  External Attachment:    Type:   Image     Comment:   External Document

## 2010-07-11 NOTE — Letter (Signed)
Summary: [facility]  [facility]icians [facility]   Imported By: Dierdre Harness 02/27/2010 10:37:04  _____________________________________________________________________  External Attachment:    Type:   Image     Comment:   External Document

## 2010-07-11 NOTE — Miscellaneous (Signed)
Summary: refill  Clinical Lists Changes  Medications: Rx of POTASSIUM CITRATE 540 MG  TBCR (POTASSIUM CITRATE) Take 1 tablet by mouth two times a day;  #60 x 2;  Signed;  Entered by: Jimmey Ralph LPN;  Authorized by: Tula Nakayama MD;  Method used: Electronically to [facility]. [phone]*, [address], [place], [place], [place]  [address], Ph: [phone] or [phone], Fax: [phone]    Prescriptions: POTASSIUM CITRATE 540 MG  TBCR (POTASSIUM CITRATE) Take 1 tablet by mouth two times a day  #60 x 2   Entered by:   Jimmey Ralph LPN   Authorized by:   Tula Nakayama MD   Signed by:   Jimmey Ralph LPN on [id number]   Method used:   Electronically to        [facility]. [phone]* (retail)       [address]       [place], [place]  [address]       Ph: [phone] or [phone]       Fax: [phone]   RxID:   [id number]

## 2010-07-11 NOTE — Letter (Signed)
Summary: PHONE NOTES  PHONE NOTES   Imported By: Dierdre Harness 11/01/2009 15:14:05  _____________________________________________________________________  External Attachment:    Type:   Image     Comment:   External Document

## 2010-07-11 NOTE — Progress Notes (Signed)
[facility]   Imported By: Dierdre Harness 02/18/2009 13:04:33  _____________________________________________________________________  External Attachment:    Type:   Image     Comment:   External Document

## 2010-07-11 NOTE — Progress Notes (Signed)
Summary: medicine  Phone Note Call from Patient   Summary of Call: needs zepro anti.  refilled that she recieved from the hospital  she is dizzy and her aid said for her not to drive she resch. her appt for next wednesday refiill at [facility] on [address] Initial call taken by: Dierdre Harness,  January 19, 2010 9:37 AM  Follow-up for Phone Call        She is wanting Cipro refilled. She was at ER and got some through IV and her rx runs out today. She said that you would know why she needed it refilled because the lithotripsy didn't work to break up the stones and she has to go see another doc about it and the hosp wants her to stay on antibiotics until then. I told her I would have to check with you first because the hospital should have gave enough to last Follow-up by: Kate Sable LPN,  August 11, 624THL 10:38 AM  Additional Follow-up for Phone Call Additional follow up Details #1::        ok ,pls refill the cipro in may.  pls follow up on the urine c/s sent from the ed yesterday, this will be ready on Monday, pls flag yourself to remember if necessary.  I am sending med for vertigo, she knows Additional Follow-up by: Tula Nakayama MD,  January 19, 2010 11:42 AM    Additional Follow-up for Phone Call Additional follow up Details #2::    Meds sent in and will send myself a flag to get culture on Monday Follow-up by: Kate Sable LPN,  August 11, 624THL 2:59 PM  New/Updated Medications: ANTIVERT 12.5 MG TABS (MECLIZINE HCL) Take 1 tablet by mouth three times a day as needed for dizziness Prescriptions: CIPROFLOXACIN HCL 500 MG TABS (CIPROFLOXACIN HCL) Take 1 tablet by mouth two times a day  #28 x 0   Entered by:   Kate Sable LPN   Authorized by:   Tula Nakayama MD   Signed by:   Kate Sable LPN on [id number]   Method used:   [facility] to        [facility] [phone]* (retail)       [address]       [place], [place]  [address]       Ph: [phone] or  [phone]       Fax: [phone]   RxID:   [id number] ANTIVERT 12.5 MG TABS (MECLIZINE HCL) Take 1 tablet by mouth three times a day as needed for dizziness  #30 x 0   Entered by:   Tula Nakayama MD   Authorized by:   Kate Sable LPN   Signed by:   Tula Nakayama MD on 01/19/2010   Method used:   Printed then faxed to ...       [facility]  [address] [phone]* (retail)       [address]       [place], [place]  [address]       Ph: [phone] or [phone]       Fax: [phone]   RxID:   [id number]

## 2010-07-11 NOTE — Letter (Signed)
Summary: demo  demo   Imported By: Dierdre Harness 11/01/2009 14:52:46  _____________________________________________________________________  External Attachment:    Type:   Image     Comment:   External Document

## 2010-07-11 NOTE — Assessment & Plan Note (Signed)
Summary: er follow up   Vital Signs:  Patient Profile:   65 Years Old Female Height:     65 inches Weight:      209.56 pounds BMI:     35.00 Pulse rate:   112 / minute Resp:     16 per minute BP sitting:   120 / 70  (left arm)  Pt. in pain?   yes    Location:   chest    Intensity:   5    Type:       sharp  Vitals Entered By: Jimmey Ralph LPN (July 29, 579FGE [id number] PM)                  Chief Complaint:  er follow up.  History of Present Illness: Patient evaluated in ED 1 day ago with c/o fever, chills, nausea, vomitting. She was diagnosed with a UTI and precribed Bactrim, which she has not filled.She has continued to take once daily Nitrofurantoin and states that she feels better. I advised her to increase dose to twice daily until her urine cultures are available, since she reports feeling better.     Current Allergies: ! PENICILLIN     Review of Systems  ENT      Denies difficulty swallowing, sinus pressure, and sore throat.  CV      Denies chest pain or discomfort, palpitations, shortness of breath with exertion, and swelling of feet.  Resp      Denies chest pain with inspiration, cough, shortness of breath, and sputum productive.  GI      Denies abdominal pain, constipation, diarrhea, and vomiting.   Physical Exam  General:     overweight-appearing.   Head:     Normocephalic and atraumatic without obvious abnormalities. No apparent alopecia or balding. Eyes:     vision grossly intact.   Ears:     R ear normal and L ear normal.   Mouth:     pharynx pink and moist and poor dentition.   Neck:     No deformities, masses, or tenderness noted. Lungs:     Normal respiratory effort, chest expands symmetrically. Lungs are clear to auscultation, no crackles or wheezes. Heart:     Normal rate and regular rhythm. S1 and S2 normal without gallop, murmur, click, rub or other extra sounds. Abdomen:     soft, non-tender, no guarding, no rigidity, and no  rebound tenderness.      Impression & Recommendations:  Problem # 1:  ANXIETY DISORDER (ICD-300.00) Assessment: Unchanged  Her updated medication list for this problem includes:    Xanax 0.5 Mg Tabs (Alprazolam) .Marland Kitchen... Take 1 tablet by mouth two times a day    Paroxetine Hcl 20 Mg Tabs (Paroxetine hcl) .Marland Kitchen... Take 1 tablet by mouth once a day   Problem # 2:  DIABETES MELLITUS, TYPE I (ICD-250.01) Assessment: Unchanged  Her updated medication list for this problem includes:    Metformin Hcl 500 Mg Tabs (Metformin hcl) .Marland Kitchen... Take 1 tablet by mouth two times a day   Problem # 3:  HYPERTENSION (ICD-401.9) Assessment: Unchanged  Problem # 4:  ACUTE CYSTITIS (ICD-595.0) Assessment: Improved Call in 2 days for results of culture sent from ED.  Complete Medication List: 1)  Metformin Hcl 500 Mg Tabs (Metformin hcl) .... Take 1 tablet by mouth two times a day 2)  Xanax 0.5 Mg Tabs (Alprazolam) .... Take 1 tablet by mouth two times a day 3)  Paroxetine Hcl 20 Mg  Tabs (Paroxetine hcl) .... Take 1 tablet by mouth once a day 4)  Lovastatin 20 Mg Tabs (Lovastatin) .... Take 1 tab by mouth at bedtime 5)  Potassium Citrate 540 Mg Tbcr (Potassium citrate) .... Take 1 tablet by mouth two times a day 6)  Omeprazole 20 Mg Tbec (Omeprazole) .... Take 1 tablet by mouth once a day 7)  Darvocet-n 100 100-650 Mg Tabs (Propoxyphene n-apap) .... Take 1 tablet by mouth once a day   Patient Instructions: 1)  Please schedule a follow-up appointment in 1 month. 2)  It is important that you exercise regularly at least 20 minutes 5 times a week. If you develop chest pain, have severe difficulty breathing, or feel very tired , stop exercising immediately and seek medical attention. 3)  You need to lose weight. Consider a lower calorie diet and regular exercise.  4)  Take NItrofurantoin 1 twice daily, and call office on FRiday morning for results of urine culture please.  .bg .bp   ] Laboratory Results    Blood Tests   Date/Time Received: 01/07/08 2:29pm Date/Time Reported: 01/07/08 2:29pm  Glucose (random): 162 mg/dL   (Normal Range: 70-105)

## 2010-07-11 NOTE — Assessment & Plan Note (Signed)
Summary: office visit   Vital Signs:  Patient profile:   65 year old female Menstrual status:  hysterectomy Height:      66.5 inches (168.91 cm) Weight:      212.31 pounds (96.50 kg) BMI:     33.88 BSA:     2.06 Pulse rate:   87 / minute Pulse rhythm:   regular Resp:     16 per minute BP sitting:   112 / 68  (left arm) Cuff size:   regular  Vitals Entered By: Jimmey Ralph LPN (April  7, 624THL 624THL AM)  Nutrition Counseling: Patient's BMI is greater than 25 and therefore counseled on weight management options. CC: follow up chronic problems Is Patient Diabetic? Yes  Pain Assessment Patient in pain? yes     Location: back Intensity: 5 Type: dull Onset of pain  Constant  years   days  Menstrual Status hysterectomy Last PAP Result Normal   CC:  follow up chronic problems.  History of Present Illness: Patient reports doing well.  Denies any recent fever or chills.  Denies any appetite change or change in bowel movements. Patient denies depression, anxiety or insomnia. the pt . denies polyuria, polydypsia, blurred vision or hypoglycemic episodes.  Current Problems (verified): 1)  Sleep Apnea  (ICD-780.57) 2)  Diabetes Mellitus, Type II  (ICD-250.00) 3)  Anxiety Disorder  (ICD-300.00) 4)  Back Pain  (ICD-724.5) 5)  Hyperlipidemia  (ICD-272.4) 6)  Hypertension  (ICD-401.9)  Current Medications (verified): 1)  Metformin Hcl 500 Mg  Tabs (Metformin Hcl) .... Take  Two  Tablet By Mouth Two Times A Day 2)  Xanax 0.5 Mg  Tabs (Alprazolam) .... Take 1 Tablet By Mouth Two Times A Day 3)  Paroxetine Hcl 20 Mg  Tabs (Paroxetine Hcl) .... Take 1 Tablet By Mouth Once A Day 4)  Lovastatin 20 Mg  Tabs (Lovastatin) .... Take 1 Tab By Mouth At Bedtime 5)  Potassium Citrate 540 Mg  Tbcr (Potassium Citrate) .... Take 1 Tablet By Mouth Two Times A Day 6)  Omeprazole 20 Mg  Tbec (Omeprazole) .... Take 1 Tablet By Mouth Twice A Day 7)  Darvocet-N 100 100-650 Mg  Tabs (Propoxyphene  N-Apap) .... Take 1 Tablet By Mouth Once A Day 8)  Amlodipine Besylate 10 Mg Tabs (Amlodipine Besylate) .... Take One Tab By Mouth Once Daily 9)  Levaquin 500 Mg Tabs (Levofloxacin) .... Take 1 Tablet By Mouth Once A Day  Allergies (verified): 1)  ! Penicillin  Diabetes Management Exam:    Foot Exam (with socks and/or shoes not present):       Sensory-Monofilament:          Left foot: normal          Right foot: normal       Inspection:          Left foot: normal          Right foot: normal       Nails:          Left foot: normal          Right foot: normal    Eye Exam:       Eye Exam done elsewhere          Date: 04/2008          Results: normal          Done by: Dr's vision   Impression & Recommendations:  Problem # 1:  DIABETES MELLITUS, TYPE II (ICD-250.00)  Assessment Comment Only  Her updated medication list for this problem includes:    Metformin Hcl 500 Mg Tabs (Metformin hcl) .Marland Kitchen... Take  two  tablet by mouth two times a day  Orders: Hemoglobin A1C [id number])  Labs Reviewed: Creat: 1.03 (09/08/2008)    Reviewed HgBA1c results: 6.5 (05/10/2008)  6.5 (02/02/2008)  Problem # 2:  ANXIETY DISORDER (ICD-300.00) Assessment: Unchanged  Her updated medication list for this problem includes:    Xanax 0.5 Mg Tabs (Alprazolam) .Marland Kitchen... Take 1 tablet by mouth two times a day    Paroxetine Hcl 20 Mg Tabs (Paroxetine hcl) .Marland Kitchen... Take 1 tablet by mouth once a day  Discussed medication use and relaxation techniques.   Problem # 3:  HYPERTENSION (ICD-401.9) Assessment: Improved  Her updated medication list for this problem includes:    Amlodipine Besylate 10 Mg Tabs (Amlodipine besylate) .Marland Kitchen... Take one tab by mouth once daily  BP today: 112/68 Prior BP: 100/74 (05/10/2008)  Labs Reviewed: K+: 4.8 (09/08/2008) Creat: : 1.03 (09/08/2008)   Chol: 165 (09/08/2008)   HDL: 44 (09/08/2008)   LDL: 88 (09/08/2008)   TG: 167 (09/08/2008)  Problem # 4:  HYPERLIPIDEMIA  (ICD-272.4) Assessment: Unchanged  Her updated medication list for this problem includes:    Lovastatin 20 Mg Tabs (Lovastatin) .Marland Kitchen... Take 1 tab by mouth at bedtime  Labs Reviewed: SGOT: 15 (09/08/2008)   SGPT: 13 (09/08/2008)   HDL:44 (09/08/2008), 44 (07/25/2007)  LDL:88 (09/08/2008), 87 (07/25/2007)  Chol:165 (09/08/2008), 157 (07/25/2007)  Trig:167 (09/08/2008), 128 (07/25/2007)  Problem # 5:  MORBID OBESITY (ICD-278.01)  Ht: 66.5 (09/15/2008)   Wt: 212.31 (09/15/2008)   BMI: 33.88 (09/15/2008)  Complete Medication List: 1)  Metformin Hcl 500 Mg Tabs (Metformin hcl) .... Take  two  tablet by mouth two times a day 2)  Xanax 0.5 Mg Tabs (Alprazolam) .... Take 1 tablet by mouth two times a day 3)  Paroxetine Hcl 20 Mg Tabs (Paroxetine hcl) .... Take 1 tablet by mouth once a day 4)  Lovastatin 20 Mg Tabs (Lovastatin) .... Take 1 tab by mouth at bedtime 5)  Potassium Citrate 540 Mg Tbcr (Potassium citrate) .... Take 1 tablet by mouth two times a day 6)  Omeprazole 20 Mg Tbec (Omeprazole) .... Take 1 tablet by mouth twice a day 7)  Darvocet-n 100 100-650 Mg Tabs (Propoxyphene n-apap) .... Take 1 tablet by mouth once a day 8)  Amlodipine Besylate 10 Mg Tabs (Amlodipine besylate) .... Take one tab by mouth once daily 9)  Levaquin 500 Mg Tabs (Levofloxacin) .... Take 1 tablet by mouth once a day  Patient Instructions: 1)  Please schedule a follow-up appointment in 4 months. 2)  It is important that you exercise regularly at least 20 minutes 5 times a week. If you develop chest pain, have severe difficulty breathing, or feel very tired , stop exercising immediately and seek medical attention. 3)  You need to lose weight. Consider a lower calorie diet and regular exercise.  4)  Your blood pressure, cholesterol and blood sugar are great, no med changes. 5)  Med is sent in for your cystitis symptoms. Prescriptions: LEVAQUIN 500 MG TABS (LEVOFLOXACIN) Take 1 tablet by mouth once a day  #5 x 0    Entered and Authorized by:   Tula Nakayama MD   Signed by:   Tula Nakayama MD on 09/15/2008   Method used:   Electronically to        [facility] [phone]* (retail)  [address]       [place], [place]  [address]       Ph: [phone] or [phone]       Fax: [phone]   RxID:   [id number]       Laboratory Results   Blood Tests   Date/Time Received: 09/15/08 11:14a Date/Time Reported: 09/15/08 11:14a

## 2010-07-11 NOTE — Progress Notes (Signed)
Summary: percert number  Phone Note Call from Patient   Summary of Call: pt has a percert number for [place] and hida [phone]  Initial call taken by: Lenn Cal,  September 26, 2009 4:35 PM

## 2010-07-11 NOTE — Progress Notes (Signed)
Summary: rx  Phone Note Call from Patient   Summary of Call: pt would like to get a rx on uti.  Stated doc gave her this before. [phone] Initial call taken by: Lenn Cal,  July 04, 2009 10:25 AM  Follow-up for Phone Call        burning with urination, increased frequency at night  Follow-up by: Jimmey Ralph LPN,  January 24, 624THL 10:30 AM  Additional Follow-up for Phone Call Additional follow up Details #1::        patient to come in and give urine for ccua in office Additional Follow-up by: Jimmey Ralph LPN,  January 24, 624THL 10:31 AM

## 2010-07-11 NOTE — Miscellaneous (Signed)
Summary: Rehab Report  Rehab Report   Imported By: Dierdre Harness 08/19/2009 13:51:27  _____________________________________________________________________  External Attachment:    Type:   Image     Comment:   External Document

## 2010-07-11 NOTE — Letter (Signed)
Summary: consults  consults   Imported By: Dierdre Harness 11/01/2009 14:57:20  _____________________________________________________________________  External Attachment:    Type:   Image     Comment:   External Document

## 2010-07-11 NOTE — Assessment & Plan Note (Signed)
Summary: office visit   Vital Signs:  Patient profile:   65 year old female Menstrual status:  hysterectomy Height:      66.5 inches Weight:      205 pounds BMI:     32.71 O2 Sat:      93 % Pulse rate:   112 / minute Pulse rhythm:   regular Resp:     16 per minute BP sitting:   124 / 82  (left arm) Cuff size:   large  Vitals Entered By: Kate Sable LPN (March 31, 624THL 075-GRM AM)  Nutrition Counseling: Patient's BMI is greater than 25 and therefore counseled on weight management options. CC: was in er on wednesday with uti. Had a reaction to the macrobid   Primary Care Provider:  Moshe Cipro, M.D.  CC:  was in er on wednesday with uti. Had a reaction to the macrobid.  History of Present Illness: pt had a uTI with uti states she was intolerant of the abiotic prescribed, with vomitting and diarreah she took the med for 3days only, states temp up to 101.8, states only cipro works for her. sHE CONTINUES TO EXPERIENCE  dysuria and frequency, states she constantly goes from one uTI to the next.She has upcoming appt with urology. Shereports sheotherwise has been well. She is not as physically active as she should be,b ut states that with the season change this wll improve.  Current Medications (verified): 1)  Metformin Hcl 500 Mg  Tabs (Metformin Hcl) .... Take  Two  Tablet By Mouth Two Times A Day 2)  Xanax 0.5 Mg  Tabs (Alprazolam) .... Take 1 Tablet By Mouth Two Times A Day 3)  Paroxetine Hcl 20 Mg  Tabs (Paroxetine Hcl) .... Take 1 Tablet By Mouth Once A Day 4)  Potassium Citrate 540 Mg  Tbcr (Potassium Citrate) .... Take 1 Tablet By Mouth Two Times A Day 5)  Omeprazole 20 Mg  Tbec (Omeprazole) .... Take 1 Tablet By Mouth Twice A Day 6)  Amlodipine Besylate 10 Mg Tabs (Amlodipine Besylate) .... Take One Tab By Mouth Once Daily 7)  Aspirin 81 Mg Tabs (Aspirin) .... As Directed 8)  Metformin Hcl 1000 Mg Tabs (Metformin Hcl) .... Two Times A Day 9)  Tramadol Hcl 50 Mg Tabs (Tramadol Hcl)  .... Take 1 Tab Bid 10)  Lovastatin 40 Mg Tabs (Lovastatin) .... One Tab By Mouth Qd 11)  Tylenol 325 Mg Tabs (Acetaminophen) .... 2 At Bedtime  Allergies: 1)  ! Penicillin 2)  ! Macrobid  Review of Systems      See HPI General:  Complains of fatigue; denies chills and fever. Eyes:  Denies blurring and discharge. ENT:  Denies hoarseness, nasal congestion, sinus pressure, and sore throat. CV:  Denies chest pain or discomfort, palpitations, and swelling of feet. Resp:  Denies cough and sputum productive. GI:  Complains of loss of appetite and nausea; progressive over the past 2 months. GU:  Complains of dysuria and urinary frequency; recurrent uTI. MS:  Complains of joint pain, muscle aches, and stiffness; upper chest and abdomen pain x 3 days, pt stretched back to save herself from falling and while pushing upp shepulled her right upper arm espescially. Derm:  Denies itching and rash. Neuro:  Denies headaches, seizures, and sensation of room spinning. Psych:  Complains of anxiety and depression; denies suicidal thoughts/plans, thoughts of violence, and unusual visions or sounds. Endo:  Denies excessive hunger and excessive thirst; tests daily, and fasting sugars are seldom over 130. Heme:  Denies abnormal bruising and bleeding. Allergy:  Denies hives or rash and seasonal allergies.  Physical Exam  General:  Well-developed,well-nourished,in no acute distress; alert,appropriate and cooperative throughout examination HEENT: No facial asymmetry,  EOMI, No sinus tenderness, TM's Clear, oropharynx  pink and moist.   Chest: Clear to auscultation bilaterally.  CVS: S1, S2, No murmurs, No S3.   Abd: Soft, Nontender.  MS: Adequate ROM spine, hips, shoulders and knees.  Ext: No edema.   CNS: CN 2-12 intact, power tone and sensation normal throughout.   Skin: Intact, no visible lesions or rashes.  Psych: Good eye contact, normal affect.  Memory intact, not anxious or depressed  appearing.    Impression & Recommendations:  Problem # 1:  ACUTE CYSTITIS (ICD-595.0) Assessment Comment Only  Her updated medication list for this problem includes:    Ciprofloxacin Hcl 500 Mg Tabs (Ciprofloxacin hcl) .Marland Kitchen... Take 1 tablet by mouth two times a day  Orders: UA Dipstick W/ Micro (manual) (81000) T-Culture, Urine [id number])  Problem # 2:  DIABETES MELLITUS, TYPE II (ICD-250.00) Assessment: Improved  Her updated medication list for this problem includes:    Metformin Hcl 500 Mg Tabs (Metformin hcl) .Marland Kitchen... Take  two  tablet by mouth two times a day    Aspirin 81 Mg Tabs (Aspirin) .Marland Kitchen... As directed    Metformin Hcl 1000 Mg Tabs (Metformin hcl) .Marland Kitchen..Marland Kitchen Two times a day  Labs Reviewed: Creat: 1.05 (08/26/2009)    Reviewed HgBA1c results: 6.3 (08/26/2009)  6.6 (04/21/2009)  Problem # 3:  HYPERTENSION (ICD-401.9) Assessment: Unchanged  Her updated medication list for this problem includes:    Amlodipine Besylate 10 Mg Tabs (Amlodipine besylate) .Marland Kitchen... Take one tab by mouth once daily  BP today: 124/82 Prior BP: 120/82 (08/03/2009)  Prior 10 Yr Risk Heart Disease: 11 % (04/21/2009)  Labs Reviewed: K+: 4.9 (08/26/2009) Creat: : 1.05 (08/26/2009)   Chol: 141 (08/26/2009)   HDL: 45 (08/26/2009)   LDL: 64 (08/26/2009)   TG: 158 (08/26/2009)  Problem # 4:  OBESITY, UNSPECIFIED (ICD-278.00) Assessment: Unchanged  Ht: 66.5 (09/08/2009)   Wt: 205 (09/08/2009)   BMI: 32.71 (09/08/2009)  Problem # 5:  HYPERLIPIDEMIA (ICD-272.4) Assessment: Improved  Her updated medication list for this problem includes:    Lovastatin 40 Mg Tabs (Lovastatin) ..... One tab by mouth qd  Labs Reviewed: SGOT: 11 (05/09/2009)   SGPT: 9 (05/09/2009)  Prior 10 Yr Risk Heart Disease: 11 % (04/21/2009)   HDL:45 (08/26/2009), 42 (05/09/2009)  LDL:64 (08/26/2009), 102 (05/09/2009)  Chol:141 (08/26/2009), 179 (05/09/2009)  Trig:158 (08/26/2009), 177 (05/09/2009)  Problem # 6:  NAUSEA AND  VOMITING (ICD-787.01) Assessment: Deteriorated  Orders: Radiology Referral (Radiology)  Complete Medication List: 1)  Metformin Hcl 500 Mg Tabs (Metformin hcl) .... Take  two  tablet by mouth two times a day 2)  Xanax 0.5 Mg Tabs (Alprazolam) .... Take 1 tablet by mouth two times a day 3)  Paroxetine Hcl 20 Mg Tabs (Paroxetine hcl) .... Take 1 tablet by mouth once a day 4)  Potassium Citrate 540 Mg Tbcr (Potassium citrate) .... Take 1 tablet by mouth two times a day 5)  Omeprazole 20 Mg Tbec (Omeprazole) .... Take 1 tablet by mouth twice a day 6)  Amlodipine Besylate 10 Mg Tabs (Amlodipine besylate) .... Take one tab by mouth once daily 7)  Aspirin 81 Mg Tabs (Aspirin) .... As directed 8)  Metformin Hcl 1000 Mg Tabs (Metformin hcl) .... Two times a day 9)  Tramadol Hcl 50 Mg Tabs (  Tramadol hcl) .... Take 1 tab bid 10)  Lovastatin 40 Mg Tabs (Lovastatin) .... One tab by mouth qd 11)  Tylenol 325 Mg Tabs (Acetaminophen) .... 2 at bedtime 12)  Ciprofloxacin Hcl 500 Mg Tabs (Ciprofloxacin hcl) .... Take 1 tablet by mouth two times a day  Other Orders: TLB-H. Pylori Abs(Helicobacter Pylori) ([id number]) T- * Misc. Laboratory test [phone])  Patient Instructions: 1)  f/u in 5 weeks 2)  H pylori and anemia panel today  and CBC today 3)  you will be referred for an [place] of your gallbladder 4)  med is sent in for uTI we will call with culture results. 5)  pls keep appt with dr [place] Pink Prescriptions: CIPROFLOXACIN HCL 500 MG TABS (CIPROFLOXACIN HCL) Take 1 tablet by mouth two times a day  #14 x 0   Entered and Authorized by:   Tula Nakayama MD   Signed by:   Tula Nakayama MD on 09/08/2009   Method used:   Electronically to        [facility]. [phone]* (retail)       [address]       [place], [place]  [address]       Ph: [phone] or [phone]       Fax: [phone]   RxID:   [id number]   Laboratory Results   Urine Tests    Routine Urinalysis    Color: yellow Appearance: Cloudy Glucose: negative   (Normal Range: Negative) Bilirubin: negative   (Normal Range: Negative) Ketone: negative   (Normal Range: Negative) Spec. Gravity: 1.015   (Normal Range: 1.003-1.035) Blood: large   (Normal Range: Negative) pH: 5.5   (Normal Range: 5.0-8.0) Protein: 100   (Normal Range: Negative) Urobilinogen: 0.2   (Normal Range: 0-1) Nitrite: negative   (Normal Range: Negative) Leukocyte Esterace: large   (Normal Range: Negative)

## 2010-07-11 NOTE — Progress Notes (Signed)
Summary: PHYSICAL THERAPY DISCHARGE  PHYSICAL THERAPY DISCHARGE   Imported By: Dierdre Harness 09/02/2009 08:52:40  _____________________________________________________________________  External Attachment:    Type:   Image     Comment:   External Document

## 2010-07-11 NOTE — Assessment & Plan Note (Signed)
Summary: hospital follow up- room 1   Vital Signs:  Patient profile:   65 year old female Menstrual status:  hysterectomy Height:      66.5 inches Weight:      203.75 pounds BMI:     32.51 O2 Sat:      97 % on Room air Pulse rate:   100 / minute Pulse (ortho):   119 / minute Resp:     16 per minute BP sitting:   100 / 78  (left arm) BP standing:   90 / 70  Vitals Entered By: Baldomero Lamy LPN (August 16, 624THL 10:00 AM)  Serial Vital Signs/Assessments:  Time      Position  BP       Pulse  Resp  Temp     By           Lying LA  110/80   100                   Baldomero Lamy LPN           Sitting   100/78   Louise LPN           Standing  90/70    Waldorf LPN  CC: hospital follow up- weak Is Patient Diabetic? Yes Comments did not bring meds to ov   Referring Provider:  same Primary Provider:  Moshe Cipro, M.D.  CC:  hospital follow up- weak.  History of Present Illness: Pt presents today for check up.  She was recently hospitalized.  She had been having recurrent UTI's.  Was found to have bilat renal lithiasis and had lithotripsy.  She then developed Pyleonephritis so was admitted.  She then had a stent put in the Rt Ureter, but states the stones are so large they wont passs & she needs to have them surgically removed.   Is taking Cipro two times a day. And has prescription pain med to use as needed ( ? Oxycodone). Has appt at [facility] with surgeon Sept 28th for consultation for surgery.  She has home health currently.  She states she is feeling weak and tired with everything going on.  The home health agency is helping her around the house.  She also c/o dizziness intermittently.  She notices this with change of positions, or if bends over then stands up.  No chest pain or pressure, no palpitations.  She states the dizziness was much worse while she was in the hospital but is still bothering her.  Needs refill of her  Alprazolam. Also hasnt been taking her Vit D prescription.  States it makes her constipated.   Allergies (verified): 1)  ! Penicillin 2)  ! Macrobid  Past History:  Past medical, surgical, family and social histories (including risk factors) reviewed, and no changes noted (except as noted below).  Past Medical History: Reviewed history from 05/17/2009 and no changes required. Current Problems:  ANXIETY DISORDER (ICD-300.00) BACK PAIN (ICD-724.5) DIABETES MELLITUS, TYPE I (ICD-250.01) HYPERLIPIDEMIA (ICD-272.4) HYPERTENSION (ICD-401.9) SLEEP APNEA Vein disease  Past Surgical History: partial hysterectomy extraction of left kidney stone lithotryspy and stent replacement rt ureter 10/2004, 12/2009 Removal of lump from posterior R thigh   Family History: Reviewed history from 10/31/2009 and no changes required. Mom HTN,Dm,CHF, has  end stage dementia in [place], in hospice for 1 month Dad deceased empyzema,HTN Sister 3 HTN< Brother 1 DM,GERD FH of Cancer:  Family History of Diabetes Family History Coronary Heart Disease female < 13 Family History of Arthritis  Social History: Reviewed history from 05/17/2009 and no changes required. Divorced retired Two children  Never Smoked Alcohol use-no Drug use-no 3 cups of caffeine per day  Review of Systems General:  Complains of fatigue, loss of appetite, and weakness; denies chills and fever. ENT:  Denies earache, sinus pressure, and sore throat. CV:  Complains of fatigue and lightheadness; denies chest pain or discomfort and palpitations. Resp:  Denies cough and shortness of breath. GI:  Complains of abdominal pain, constipation, and loss of appetite; denies bloody stools, dark tarry stools, indigestion, nausea, and vomiting; CONSTIPATION WORSE INPT, BUT STILL HAVING INTERMITTENT PROBLEMS WITH. Neuro:  Denies headaches.  Physical Exam  General:  Well-developed,well-nourished,in no acute distress; alert,appropriate and  cooperative throughout examination Head:  Normocephalic and atraumatic without obvious abnormalities. No apparent alopecia or balding. Eyes:  pupils equal, pupils round, pupils reactive to light, and no nystagmus.   Ears:  External ear exam shows no significant lesions or deformities.  Otoscopic examination reveals clear canals, tympanic membranes are intact bilaterally without bulging, retraction, inflammation or discharge. Hearing is grossly normal bilaterally. Nose:  External nasal examination shows no deformity or inflammation. Nasal mucosa are pink and moist without lesions or exudates. Mouth:  Oral mucosa and oropharynx without lesions or exudates.   Neck:  No deformities, masses, or tenderness noted.no thyromegaly and no carotid bruits.   Lungs:  Normal respiratory effort, chest expands symmetrically. Lungs are clear to auscultation, no crackles or wheezes. Heart:  Normal rate and regular rhythm. S1 and S2 normal without gallop, murmur, click, rub or other extra sounds. Abdomen:  soft, non-tender, no masses, no hepatomegaly, and no splenomegaly.   Neurologic:  cranial nerves II-XII grossly intact.  cranial nerves II-XII intact, gait normal, and DTRs symmetrical and normal.   Cervical Nodes:  No lymphadenopathy noted Psych:  Cognition and judgment appear intact. Alert and cooperative with normal attention span and concentration. No apparent delusions, illusions, hallucinations   Impression & Recommendations:  Problem # 1:  ORTHOSTATIC DIZZINESS (ICD-780.4) Assessment New Discussed with pt this certainly may be due to her decreased nutrional intake, pain meds, and infection. Her systolic BP though did decrease and pulse increased when orthostatic BP's checked.  Will decrease Amlodipine dose at this time and monitor BP.  Advised pt to take 1/2 tablet daily.  Her updated medication list for this problem includes:    Antivert 12.5 Mg Tabs (Meclizine hcl) .Marland Kitchen... Take 1 tablet by mouth three  times a day as needed for dizziness  Orders: T-CBC w/Diff (Q000111Q) T-Basic Metabolic Panel (99991111)  Problem # 2:  PYELONEPHRITIS (ICD-590.80) Assessment: Improved Continue f/u with urologist.  Her updated medication list for this problem includes:    Ciprofloxacin Hcl 500 Mg Tabs (Ciprofloxacin hcl) .Marland Kitchen... Take 1 tablet by mouth two times a day  Orders: T-CBC w/Diff ([id number]) T-Basic Metabolic Panel (99991111)  Problem # 3:  RENAL CALCULUS (ICD-592.0) Assessment: Unchanged Keep appt with specialist at [facility].  Problem # 4:  HYPERTENSION (ICD-401.9) Assessment: Comment Only  Her updated medication list for this problem includes:    Amlodipine Besylate 10 Mg Tabs (Amlodipine besylate) .Marland Kitchen... Take one tab by mouth once daily  BP today: 110/80 Prior BP: 110/80 (10/31/2009)  Prior 10 Yr Risk Heart Disease: 11 % (04/21/2009)  Labs Reviewed: K+: 4.9 (08/26/2009) Creat: : 1.05 (08/26/2009)   Chol: 141 (08/26/2009)   HDL: 45 (08/26/2009)   LDL: 64 (08/26/2009)   TG: 158 (08/26/2009)  Complete Medication List: 1)  Metformin Hcl 500 Mg Tabs (Metformin hcl) .... Take  two  tablet by mouth two times a day 2)  Xanax 0.5 Mg Tabs (Alprazolam) .... Take 1 tablet by mouth two times a day 3)  Potassium Citrate 540 Mg Tbcr (Potassium citrate) .... Take 1 tablet by mouth two times a day 4)  Omeprazole 20 Mg Tbec (Omeprazole) .... Take 1 tablet by mouth twice a day 5)  Amlodipine Besylate 10 Mg Tabs (Amlodipine besylate) .... Take one tab by mouth once daily 6)  Aspirin 81 Mg Tabs (Aspirin) .... As directed 7)  Tramadol Hcl 50 Mg Tabs (Tramadol hcl) .... Take 1 tab bid 8)  Lovastatin 40 Mg Tabs (Lovastatin) .... One tab by mouth qd 9)  Tylenol 325 Mg Tabs (Acetaminophen) .... 2 at bedtime 10)  Ciprofloxacin Hcl 500 Mg Tabs (Ciprofloxacin hcl) .... Take 1 tablet by mouth two times a day 11)  Vitamin D (ergocalciferol) 50000 Unit Caps (Ergocalciferol) .... One capsule once  weekly 12)  Paroxetine Hcl 40 Mg Tabs (Paroxetine hcl) .... Take 1 tablet by mouth once a day 13)  Antivert 12.5 Mg Tabs (Meclizine hcl) .... Take 1 tablet by mouth three times a day as needed for dizziness 14)  Antivert 12.5 Mg Tabs (Meclizine hcl) .... Take 1 tablet by mouth three times a day as needed for dizziness  Patient Instructions: 1)  Please schedule a follow-up appointment in 2 weeks wit Dr Moshe Cipro. 2)  I would like you to increase your water intake. 3)  You need to increase your food intake also.  I recommend you drink 1 can of Ensure or Boost a day. 4)  Have blood work drawn today. 5)  Stop taking the prescription Vitamin D that is once weekly.  Take an over the counter Vitamin D 1000 mg once a day instead. 6)  TAKE ONE HALF A TABLET OF YOUR AMLODIPINE (NORVASC) ONCE A DAY, INSTEAD OF A WHOLE TABLET, UNTIL YOUR NEXT APPT. Prescriptions: XANAX 0.5 MG  TABS (ALPRAZOLAM) Take 1 tablet by mouth two times a day  #30 x 1   Entered and Authorized by:   Kennith Gain PA   Signed by:   Kennith Gain PA on 01/24/2010   Method used:   Printed then faxed to ...       [facility]  [address] [phone]* (retail)       [address]       [place], [place]  [address]       Ph: [phone] or [phone]       Fax: [phone]   RxID:   [id number]

## 2010-07-11 NOTE — Progress Notes (Signed)
  Phone Note Call from Patient   Caller: Patient Summary of Call: patient states dr [place] Pink put her on potassium to prevent kidney stones. should she really stop this? Initial call taken by: Jimmey Ralph LPN,  November 30, 624THL 11:15 AM  Follow-up for Phone Call        the level is slightly higher than normal, continue the potassium for now, I am sending a msg to Dr. Maryland Pink, if he feels she needs it then no need to stop Follow-up by: Tula Nakayama MD,  May 10, 2009 1:16 PM  Additional Follow-up for Phone Call Additional follow up Details #1::        Phone Call Completed Additional Follow-up by: Jimmey Ralph LPN,  November 30, 624THL 5:08 PM

## 2010-07-11 NOTE — Assessment & Plan Note (Signed)
Summary: office visit   Vital Signs:  Patient profile:   65 year old female Menstrual status:  hysterectomy Height:      66.5 inches Weight:      198.25 pounds BMI:     31.63 O2 Sat:      93 % on Room air Pulse rate:   99 / minute Pulse rhythm:   regular Resp:     16 per minute BP sitting:   134 / 88  (left arm) Cuff size:   regular  Vitals Entered By: Louie Casa, CMA  Nutrition Counseling: Patient's BMI is greater than 25 and therefore counseled on weight management options.  O2 Flow:  Room air CC: Follow up Comments Patient did not bring meds   Primary Care Dquan Cortopassi:  Moshe Cipro, M.D.  CC:  Follow up.  History of Present Illness: Reports  that they are doing well. Denies recent fever or chills. Denies sinus pressure, nasal congestion , ear pain or sore throat. Denies chest congestion, or cough productive of sputum. Denies chest pain, palpitations, PND, orthopnea or leg swelling. Denies abdominal pain, nausea, vomitting, diarrhea or constipation. Denies change in bowel movements or bloody stool. Denies dysuria , frequency, incontinence or hesitancy. Denies  joint pain, swelling, or reduced mobility. Denies headaches, vertigo, seizures. Denies depression, anxiety or insomnia. Denies  rash, lesions, or itch.   She has upcoming surgery in 1 week for kidney stones which have caused recurrent UTI's and which have not been amenable to lithotrpsy.She ahs cipro which ashe will begin prior to surgery, adn has been advised to d/c asprin a few days before.    Allergies (verified): 1)  ! Penicillin 2)  ! Macrobid  Review of Systems      See HPI General:  Complains of fatigue. Eyes:  Denies blurring and discharge. GU:  recurrent uTI's with nephrolithiasis. MS:  Complains of joint pain, low back pain, and mid back pain. Psych:  Complains of anxiety, depression, and mental problems; denies suicidal thoughts/plans, thoughts of violence, and unusual visions or sounds. Endo:   Denies cold intolerance, excessive thirst, excessive urination, and heat intolerance. Heme:  Denies abnormal bruising and bleeding. Allergy:  Denies hives or rash and itching eyes.  Physical Exam  General:  Well-developed,well-nourished,in no acute distress; alert,appropriate and cooperative throughout examination HEENT: No facial asymmetry,  EOMI, No sinus tenderness, TM's Clear, oropharynx  pink and moist.   Chest: Clear to auscultation bilaterally.  CVS: S1, S2, No murmurs, No S3.   Abd: Soft, Nontender.  MS: Adequate ROM spine, hips, shoulders and knees.  Ext: No edema.   CNS: CN 2-12 intact, power tone and sensation normal throughout.   Skin: Intact, no visible lesions or rashes.  Psych: Good eye contact, normal affect.  Memory intact, not anxious or depressed appearing.    Impression & Recommendations:  Problem # 1:  RENAL CALCULUS (ICD-592.0) Assessment Comment Only planned surgical intervention next week at tertiary center  Problem # 2:  HYPERTENSION (ICD-401.9) Assessment: Unchanged  Her updated medication list for this problem includes:    Amlodipine Besylate 10 Mg Tabs (Amlodipine besylate) .Marland Kitchen... Take one tab by mouth once daily  Orders: T-Basic Metabolic Panel (99991111)  BP today: 134/88 Prior BP: 124/90 (02/07/2010)  Prior 10 Yr Risk Heart Disease: 11 % (04/21/2009)  Labs Reviewed: K+: 4.4 (01/24/2010) Creat: : 1.24 (01/24/2010)   Chol: 141 (08/26/2009)   HDL: 45 (08/26/2009)   LDL: 64 (08/26/2009)   TG: 158 (08/26/2009)  Problem # 3:  DIABETES  MELLITUS, TYPE II (ICD-250.00) Assessment: Comment Only  The following medications were removed from the medication list:    Aspirin 81 Mg Tabs (Aspirin) .Marland Kitchen... As directed Her updated medication list for this problem includes:    Metformin Hcl 500 Mg Tabs (Metformin hcl) .Marland Kitchen... Take  two  tablet by mouth two times a day Patient advised to reduce carbs and sweets, commit to regular physical activity, take meds  as prescribed, test blood sugars as directed, and attempt to lose weight , to improve blood sugar control.   Orders: T- Hemoglobin A1C [id number])  Labs Reviewed: Creat: 1.24 (01/24/2010)    Reviewed HgBA1c results: 6.8 (01/25/2010)  6.3 (08/26/2009)  Problem # 4:  HYPERLIPIDEMIA (ICD-272.4) Assessment: Comment Only  Her updated medication list for this problem includes:    Lovastatin 40 Mg Tabs (Lovastatin) ..... One tab by mouth qd Low fat dietdiscussed and encouraged  Orders: T-Hepatic Function [id number]) T-Lipid Profile [phone])  Labs Reviewed: SGOT: 11 (05/09/2009)   SGPT: 9 (05/09/2009)  Prior 10 Yr Risk Heart Disease: 11 % (04/21/2009)   HDL:45 (08/26/2009), 42 (05/09/2009)  LDL:64 (08/26/2009), 102 (05/09/2009)  Chol:141 (08/26/2009), 179 (05/09/2009)  Trig:158 (08/26/2009), 177 (05/09/2009)  Problem # 5:  OBESITY, UNSPECIFIED (ICD-278.00) Assessment: Unchanged  Ht: 66.5 (03/20/2010)   Wt: 198.25 (03/20/2010)   BMI: 31.63 (03/20/2010) therapeutic lifestyle change discussed and encouraged  Complete Medication List: 1)  Metformin Hcl 500 Mg Tabs (Metformin hcl) .... Take  two  tablet by mouth two times a day 2)  Potassium Citrate 540 Mg Tbcr (Potassium citrate) .... Take 1 tablet by mouth two times a day 3)  Omeprazole 20 Mg Tbec (Omeprazole) .... Take 1 tablet by mouth twice a day 4)  Amlodipine Besylate 10 Mg Tabs (Amlodipine besylate) .... Take one tab by mouth once daily 5)  Tramadol Hcl 50 Mg Tabs (Tramadol hcl) .... Take 1 tab bid 6)  Lovastatin 40 Mg Tabs (Lovastatin) .... One tab by mouth qd 7)  Tylenol 325 Mg Tabs (Acetaminophen) .... 2 at bedtime 8)  Paroxetine Hcl 40 Mg Tabs (Paroxetine hcl) .... Take 1 tablet by mouth once a day 9)  Alprazolam 0.5 Mg Tabs (Alprazolam) .... Take 1 tablet by mouth two times a day  Patient Instructions: 1)  F/u MID DECEMBER. 2)  All the best with your surgery. 3)  BMP prior to visit, ICD-9: 4)  Hepatic Panel  prior to visit, ICD-9: 5)  Lipid Panel prior to visit, ICD-9:   fasting llabs mid December. 6)  HbgA1C prior to visit, ICD-9: 7)  Meds as discussed Prescriptions: ALPRAZOLAM 0.5 MG TABS (ALPRAZOLAM) Take 1 tablet by mouth two times a day  #60 x 4   Entered and Authorized by:   Tula Nakayama MD   Signed by:   Tula Nakayama MD on 03/20/2010   Method used:   Printed then faxed to ...       [facility]  [address] [phone]* (retail)       [address]       [place], [place]  [address]       Ph: [phone] or [phone]       Fax: [phone]   RxID:   [id number]

## 2010-07-11 NOTE — Progress Notes (Signed)
Summary: sick  Phone Note Call from Patient   Summary of Call: Medicine that dr gave her is making her have fever and chills and vomiting. when she stops taking it she can't control bladder and burns. [id number] Initial call taken by: Lenn Cal,  January 22, 2008 9:55 AM  Follow-up for Phone Call        Roselyn Reef order urine C/S pt. aware   Follow-up by: Tula Nakayama MD,  January 22, 2008 1:08 PM  Additional Follow-up for Phone Call Additional follow up Details #1::        urine culture ordered Additional Follow-up by: Jimmey Ralph LPN,  August 13, 579FGE 4:46 PM    New/Updated Medications: LEVAQUIN 500 MG  TABS (LEVOFLOXACIN) Take 1 tablet by mouth once a day   Prescriptions: LEVAQUIN 500 MG  TABS (LEVOFLOXACIN) Take 1 tablet by mouth once a day  #7 x 0   Entered and Authorized by:   Tula Nakayama MD   Signed by:   Tula Nakayama MD on 01/22/2008   Method used:   Electronically sent to ...       [facility] [phone]*       [address]       [place], [place]  [address]       Ph: [phone] or [phone]       Fax: [phone]   RxID:   [id number]

## 2010-07-11 NOTE — Letter (Signed)
Summary: s/p bilateral pcnl procedures  s/p bilateral pcnl procedures   Imported By: Dierdre Harness 05/19/2010 13:47:09  _____________________________________________________________________  External Attachment:    Type:   Image     Comment:   External Document

## 2010-07-11 NOTE — Progress Notes (Signed)
Summary: chills and vomiting  Phone Note Call from Patient   Summary of Call: Pt still having chills and vomiting. pt can't sleep. What can she do. [id number] Initial call taken by: Lenn Cal,  January 08, 2008 1:30 PM  Follow-up for Phone Call        left message to return call Follow-up by: Jimmey Ralph LPN,  July 30, 579FGE 075-GRM PM  Additional Follow-up for Phone Call Additional follow up Details #1::        Dr. Moshe Cipro spoke with patient Additional Follow-up by: Rennie Plowman,  January 09, 2008 11:11 AM

## 2010-07-11 NOTE — Miscellaneous (Signed)
Summary: refills  Clinical Lists Changes  Medications: Rx of XANAX 0.5 MG  TABS (ALPRAZOLAM) Take 1 tablet by mouth two times a day;  #60 x 1;  Signed;  Entered by: Jimmey Ralph LPN;  Authorized by: Tula Nakayama MD;  Method used: Printed then faxed to [facility]. [phone]*, [address] [place], [place], [place]  [address], Ph: [phone] or [phone], Fax: [phone]    Prescriptions: XANAX 0.5 MG  TABS (ALPRAZOLAM) Take 1 tablet by mouth two times a day  #60 x 1   Entered by:   Jimmey Ralph LPN   Authorized by:   Tula Nakayama MD   Signed by:   Jimmey Ralph LPN on [id number]   Method used:   Printed then faxed to ...       [facility]  [address] [phone]* (retail)       [address]       [place], [place]  [address]       Ph: [phone] or [phone]       Fax: [phone]   RxID:   [id number]

## 2010-07-11 NOTE — Assessment & Plan Note (Signed)
Summary: office visit   Vital Signs:  Patient profile:   65 year old female Menstrual status:  hysterectomy Height:      66.5 inches Weight:      205 pounds BMI:     32.71 O2 Sat:      93 % Pulse rate:   89 / minute Pulse rhythm:   regular Resp:     16 per minute BP sitting:   110 / 80  (left arm) Cuff size:   large  Vitals Entered By: Kate Sable (January 19, 2009 10:40 AM)  Nutrition Counseling: Patient's BMI is greater than 25 and therefore counseled on weight management options. CC: been having sharp pains in center of chest, other times its a nagging pain there for a month Is Patient Diabetic? Yes   CC:  been having sharp pains in center of chest and other times its a nagging pain there for a month.  History of Present Illness: Patient reports doing well.  Denies any recent fever or chills.  Denies any appetite change or change in bowel movements. Patient denies depression, anxiety or insomnia.  She denies polyuria, polydypsia, blurred vision or hypoglycemic episodes. She has had no recent head or chest congestion.  Current Medications (verified): 1)  Metformin Hcl 500 Mg  Tabs (Metformin Hcl) .... Take  Two  Tablet By Mouth Two Times A Day 2)  Xanax 0.5 Mg  Tabs (Alprazolam) .... Take 1 Tablet By Mouth Two Times A Day 3)  Paroxetine Hcl 20 Mg  Tabs (Paroxetine Hcl) .... Take 1 Tablet By Mouth Once A Day 4)  Lovastatin 20 Mg  Tabs (Lovastatin) .... Take 1 Tab By Mouth At Bedtime 5)  Potassium Citrate 540 Mg  Tbcr (Potassium Citrate) .... Take 1 Tablet By Mouth Two Times A Day 6)  Omeprazole 20 Mg  Tbec (Omeprazole) .... Take 1 Tablet By Mouth Twice A Day 7)  Darvocet-N 100 100-650 Mg  Tabs (Propoxyphene N-Apap) .... Take 1 Tablet By Mouth Once A Day 8)  Amlodipine Besylate 10 Mg Tabs (Amlodipine Besylate) .... Take One Tab By Mouth Once Daily  Allergies (verified): 1)  ! Penicillin  Review of Systems      See HPI General:  See HPI; Complains of fatigue. Eyes:   Denies blurring and discharge. ENT:  Denies hoarseness, nasal congestion, sinus pressure, and sore throat. CV:  Complains of chest pain or discomfort, palpitations, and shortness of breath with exertion; denies difficulty breathing while lying down; 1 month h/o chest pain with or without activity, no assocd lightheadedness, nausea or diaphoresis, on avg daily duration less than 10 mins, sometimes sharp and  other times dull and squeezing. Resp:  Denies cough, sputum productive, and wheezing. GI:  Denies abdominal pain, constipation, diarrhea, nausea, and vomiting. GU:  See HPI; Denies dysuria, incontinence, and urinary frequency. MS:  Complains of joint pain, low back pain, mid back pain, and stiffness. Derm:  Denies itching, lesion(s), and rash. Neuro:  Denies seizures and tingling. Psych:  See HPI; Complains of anxiety and depression; denies suicidal thoughts/plans, thoughts of violence, unusual visions or sounds, and thoughts /plans of harming others; well controlled on meds. Pt however states she has recently noted that "all" her friends are becoming increasingly busy with theirown lives. Endo:  See HPI. Heme:  Denies abnormal bruising and bleeding. Allergy:  Complains of seasonal allergies; mid symptoms.  Physical Exam  General:  alert, well-hydrated, and overweight-appearing.  HEENT: No facial asymmetry,  EOMI, No sinus tenderness, TM's  Clear, oropharynx  pink and moist.   Chest: Clear to auscultation bilaterally.  CVS: S1, S2, No murmurs, No S3.   Abd: Soft, Nontender.  MS: Adequate ROM spine, hips, shoulders and knees.  Ext: No edema.   CNS: CN 2-12 intact, power tone and sensation normal throughout.   Skin: Intact, no visible lesions or rashes.  Psych: Good eye contact, normal affect.  Memory intact, not anxious or depressed appearing.    Diabetes Management Exam:    Foot Exam (with socks and/or shoes not present):       Sensory-Monofilament:          Left foot: diminished           Right foot: diminished       Inspection:          Left foot: normal          Right foot: normal       Nails:          Left foot: normal          Right foot: normal   Impression & Recommendations:  Problem # 1:  CHEST PAIN UNSPECIFIED (ICD-786.50) Assessment Comment Only  Orders: EKG w/ Interpretation (93000), normals sinus ryhtm , no evidence of ischemia. Pt repoprts recent palpitations, exertional fatigue and chest pain, she has multiple risk factors for cardiac disease Cardiology Referral (Cardiology)  Problem # 2:  OBESITY, UNSPECIFIED (ICD-278.00) Assessment: Improved  Ht: 66.5 (01/19/2009)   Wt: 205 (01/19/2009)   BMI: 32.71 (01/19/2009)  Problem # 3:  DIABETES MELLITUS, TYPE II (ICD-250.00) Assessment: Unchanged  Her updated medication list for this problem includes:    Metformin Hcl 500 Mg Tabs (Metformin hcl) .Marland Kitchen... Take  two  tablet by mouth two times a day  Orders: Glucose, (CBG) [id number]) Hemoglobin A1C (83036)  Labs Reviewed: Creat: 1.03 (09/08/2008)    Reviewed HgBA1c results: 6.5 (01/19/2009)  6.5 (05/10/2008)  Problem # 4:  ANXIETY DISORDER (ICD-300.00) Assessment: Unchanged  Her updated medication list for this problem includes:    Xanax 0.5 Mg Tabs (Alprazolam) .Marland Kitchen... Take 1 tablet by mouth two times a day    Paroxetine Hcl 20 Mg Tabs (Paroxetine hcl) .Marland Kitchen... Take 1 tablet by mouth once a day  Problem # 5:  BACK PAIN (ICD-724.5) Assessment: Unchanged  Her updated medication list for this problem includes:    Darvocet-n 100 100-650 Mg Tabs (Propoxyphene n-apap) .Marland Kitchen... Take 1 tablet by mouth once a day  Problem # 6:  HYPERTENSION (ICD-401.9) Assessment: Unchanged  Her updated medication list for this problem includes:    Amlodipine Besylate 10 Mg Tabs (Amlodipine besylate) .Marland Kitchen... Take one tab by mouth once daily  BP today: 110/80 Prior BP: 112/68 (09/15/2008)  Labs Reviewed: K+: 4.8 (09/08/2008) Creat: : 1.03 (09/08/2008)   Chol: 165  (09/08/2008)   HDL: 44 (09/08/2008)   LDL: 88 (09/08/2008)   TG: 167 (09/08/2008)  Problem # 7:  HYPERLIPIDEMIA (ICD-272.4) Assessment: Comment Only  Her updated medication list for this problem includes:    Lovastatin 20 Mg Tabs (Lovastatin) .Marland Kitchen... Take 1 tab by mouth at bedtime  Labs Reviewed: SGOT: 15 (09/08/2008)   SGPT: 13 (09/08/2008)   HDL:44 (09/08/2008), 44 (07/25/2007)  LDL:88 (09/08/2008), 87 (07/25/2007)  Chol:165 (09/08/2008), 157 (07/25/2007)  Trig:167 (09/08/2008), 128 (07/25/2007)  Complete Medication List: 1)  Metformin Hcl 500 Mg Tabs (Metformin hcl) .... Take  two  tablet by mouth two times a day 2)  Xanax 0.5 Mg Tabs (Alprazolam) .... Take 1  tablet by mouth two times a day 3)  Paroxetine Hcl 20 Mg Tabs (Paroxetine hcl) .... Take 1 tablet by mouth once a day 4)  Lovastatin 20 Mg Tabs (Lovastatin) .... Take 1 tab by mouth at bedtime 5)  Potassium Citrate 540 Mg Tbcr (Potassium citrate) .... Take 1 tablet by mouth two times a day 6)  Omeprazole 20 Mg Tbec (Omeprazole) .... Take 1 tablet by mouth twice a day 7)  Darvocet-n 100 100-650 Mg Tabs (Propoxyphene n-apap) .... Take 1 tablet by mouth once a day 8)  Amlodipine Besylate 10 Mg Tabs (Amlodipine besylate) .... Take one tab by mouth once daily  Patient Instructions: 1)  Please schedule a follow-up appointment in 3 months. 2)  BMP prior to visit, ICD-9: 3)  Hepatic Panel prior to visit, ICD-9:  fasting in 3 months. 4)  Your blood pressure and blood sugar are great. 5)  Lipid Panel prior to visit, ICD-9: 6)  It is important that you exercise regularly at least 30 minutes 6 times a week. If you develop chest pain, have severe difficulty breathing, or feel very tired , stop exercising immediately and seek medical attention. 7)  You need to lose weight. Consider a lower calorie diet and regular exercise.  8)  You will be referred to Dr. Claiborne Billings to eval your new chest pain. Prescriptions: OMEPRAZOLE 20 MG  TBEC (OMEPRAZOLE)  Take 1 tablet by mouth twice a day  #60 x 5   Entered by:   Jimmey Ralph LPN   Authorized by:   Tula Nakayama MD   Signed by:   Jimmey Ralph LPN on 075-GRM   Method used:   Electronically to        [facility] [phone]* (retail)       [address]       [place], [place]  [address]       Ph: [phone] or [phone]       Fax: [phone]   [id number] AMLODIPINE BESYLATE 10 MG TABS (AMLODIPINE BESYLATE) take one tab by mouth once daily  #30 x 5   Entered by:   Jimmey Ralph LPN   Authorized by:   Tula Nakayama MD   Signed by:   Jimmey Ralph LPN on 075-GRM   Method used:   Electronically to        [facility] [phone]* (retail)       [address]       [place], [place]  [address]       Ph: [phone] or [phone]       Fax: [phone]   [id number] METFORMIN HCL 500 MG  TABS (METFORMIN HCL) Take  two  tablet by mouth two times a day  #120 Tablet x 5   Entered by:   Jimmey Ralph LPN   Authorized by:   Tula Nakayama MD   Signed by:   Jimmey Ralph LPN on 075-GRM   Method used:   Electronically to        [facility] [phone]* (retail)       [address]       [place], [place]  [address]       Ph: [phone] or [phone]       Fax: [phone]   [id number]   Laboratory Results  Blood Tests   Date/Time Received: January 19, 2009  Date/Time Reported: January 19, 2009   Glucose (random): 76 mg/dL   (Normal Range: 70-105) HGBA1C: 6.5%   (Normal Range: Non-Diabetic - 3-6%   Control Diabetic - 6-8%)
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
## 2010-07-11 NOTE — Progress Notes (Signed)
Summary: [facility]   Imported By: Dierdre Harness 02/01/2009 13:20:54  _____________________________________________________________________  External Attachment:    Type:   Image     Comment:   External Document

## 2010-07-11 NOTE — Progress Notes (Signed)
Summary: uti rx  Phone Note Call from Patient   Summary of Call: wants something for her uti     [facility] [address] [phone] Initial call taken by: Dierdre Harness,  August 03, 2009 7:56 AM  Follow-up for Phone Call        please advise need appt and schedule with dawn Follow-up by: Baldomero Lamy LPN,  February 23, 624THL 9:14 AM  Additional Follow-up for Phone Call Additional follow up Details #1::        LEFT MESAGE Additional Follow-up by: Dierdre Harness,  August 03, 2009 11:31 AM    Additional Follow-up for Phone Call Additional follow up Details #2::    COMING IN TODAY Follow-up by: Dierdre Harness,  August 03, 2009 1:28 PM

## 2010-07-11 NOTE — Progress Notes (Signed)
Summary: stomach pains  Phone Note Call from Patient   Summary of Call: having stomach pains right at the bottom. [phone] Initial call taken by: Lenn Cal,  June 30, 2009 2:57 PM  Follow-up for Phone Call        lower abdominal pain, feels like somthing is gonna fall out when she walks, keeping uti, tired moderate pain no fever   Follow-up by: Jimmey Ralph LPN,  January 20, 624THL 3:07 PM  Additional Follow-up for Phone Call Additional follow up Details #1::        patient scheduled appt for monday, advised if worse over weekend, go to er Additional Follow-up by: Jimmey Ralph LPN,  January 20, 624THL 3:07 PM

## 2010-07-11 NOTE — Letter (Signed)
Summary: [facility]   Imported By: Dierdre Harness 03/15/2010 08:38:59  _____________________________________________________________________  External Attachment:    Type:   Image     Comment:   External Document

## 2010-07-11 NOTE — Letter (Signed)
Summary: [facility]  [facility]   Imported By: Dierdre Harness 11/01/2009 15:05:10  _____________________________________________________________________  External Attachment:    Type:   Image     Comment:   External Document

## 2010-07-11 NOTE — Letter (Signed)
Summary: [facility]   Imported By: Dierdre Harness 03/17/2010 13:22:29  _____________________________________________________________________  External Attachment:    Type:   Image     Comment:   External Document

## 2010-07-11 NOTE — Progress Notes (Signed)
Summary: medicine  Phone Note Call from Patient   Summary of Call: [facility] please call her and let her know she is leaving home at 10:00 am Initial call taken by: Dierdre Harness,  March 15, 2010 7:44 AM  Follow-up for Phone Call        find out what she is talking about and refill pls Follow-up by: Tula Nakayama MD,  March 15, 2010 8:10 AM  Additional Follow-up for Phone Call Additional follow up Details #1::        She was talking about the xanax which I resent yesterday. [facility]. Patient must have gotten rx because she was leaving to go out of town at [place] and she never called back to say it wasn't there Additional Follow-up by: Kate Sable LPN,  October  5, 624THL 2:20 PM    Prescriptions: XANAX 0.5 MG  TABS (ALPRAZOLAM) Take 1 tablet by mouth two times a day  #30 x 2   Entered by:   Kate Sable LPN   Authorized by:   Tula Nakayama MD   Signed by:   Kate Sable LPN on [id number]   Method used:   Print[facility]       [facility]  [address] [phone][facility] ([facility]ail[facility]       [address]       [place], [place]  [address]       Ph: [phone] or [phone]       Fax: [phone]   RxID:   [id number]

## 2010-07-11 NOTE — Letter (Signed)
Summary: [facility]   Imported By: Lenn Cal 09/27/2009 08:58:30  _____________________________________________________________________  External Attachment:    Type:   Image     Comment:   External Document

## 2010-07-13 NOTE — Progress Notes (Signed)
  Phone Note Other Incoming   Caller: ed Summary of Call: call from the ed that pt is being put on clindamycin for post cervical node and swelling. neds f/u in office Initial call taken by: Tula Nakayama MD,  June 08, 2010 8:04 PM  Follow-up for Phone Call        pls sched appt for ed f/u 01/11 or after one day that week pls, re-eval swollen lymph node, call anbd give her appt info  pls Follow-up by: Tula Nakayama MD,  June 08, 2010 8:05 PM  Additional Follow-up for Phone Call Additional follow up Details #1::        Patient has an appt on the 12th of Jan.  Additional Follow-up by: Eliezer Mccoy,  June 09, 2010 8:40 AM

## 2010-07-13 NOTE — Assessment & Plan Note (Signed)
Summary: office visit   Vital Signs:  Patient profile:   65 year old female Menstrual status:  hysterectomy Height:      66.5 inches Weight:      202.50 pounds BMI:     32.31 O2 Sat:      95 % on Room air Pulse rate:   101 / minute Pulse rhythm:   regular Resp:     16 per minute BP sitting:   130 / 80  (left arm)  Vitals Entered By: Baldomero Lamy LPN (December 13, 624THL 2:09 PM)  Nutrition Counseling: Patient's BMI is greater than 25 and therefore counseled on weight management options.  O2 Flow:  Room air CC: follow-up visit Is Patient Diabetic? Yes Did you bring your meter with you today? No   Primary Care Erion Hermans:  Moshe Cipro, M.D.  CC:  follow-up visit.  History of Present Illness: Reports  that she has been doing well. Denies recent fever or chills. Denies sinus pressure, nasal congestion , ear pain or sore throat. Denies chest congestion, or cough productive of sputum. Denies chest pain, palpitations, PND, orthopnea or leg swelling. Denies abdominal pain, nausea, vomitting, diarrhea or constipation. Denies change in bowel movements or bloody stool. Denies dysuria , frequency, incontinence or hesitancy.Symptom resolution following reent surgery. Chronic  joint pain, swelling,and reduced mobility. Denies headaches, vertigo, seizures. Denies uncontrolled  depression, anxiety or insomnia. Denies  rash, lesions, or itch.     Current Medications (verified): 1)  Metformin Hcl 500 Mg  Tabs (Metformin Hcl) .... Take  Two  Tablet By Mouth Two Times A Day 2)  Potassium Citrate 540 Mg  Tbcr (Potassium Citrate) .... Take 1 Tablet By Mouth Two Times A Day 3)  Omeprazole 20 Mg  Tbec (Omeprazole) .... Take 1 Tablet By Mouth Twice A Day 4)  Amlodipine Besylate 10 Mg Tabs (Amlodipine Besylate) .... Take One Tab By Mouth Once Daily 5)  Tramadol Hcl 50 Mg Tabs (Tramadol Hcl) .... Take 1 Tab Bid 6)  Lovastatin 40 Mg Tabs (Lovastatin) .... One Tab By Mouth Qd 7)  Tylenol 325 Mg Tabs  (Acetaminophen) .... 2 At Bedtime 8)  Paroxetine Hcl 40 Mg Tabs (Paroxetine Hcl) .... Take 1 Tablet By Mouth Once A Day 9)  Alprazolam 0.5 Mg Tabs (Alprazolam) .... Take 1 Tablet By Mouth Two Times A Day 10)  Ferretts 325 (106 Fe) Mg Tabs (Ferrous Fumarate) .... One Tab By Mouth Once Daily 11)  Aspir-Low 81 Mg Tbec (Aspirin) .... One Tab By Mouth Once Daily  Allergies (verified): 1)  ! Penicillin 2)  ! Macrobid  Past History:  Past medical, surgical, family and social histories (including risk factors) reviewed, and no changes noted (except as noted below).  Past Medical History: Reviewed history from 05/17/2009 and no changes required. Current Problems:  ANXIETY DISORDER (ICD-300.00) BACK PAIN (ICD-724.5) DIABETES MELLITUS, TYPE I (ICD-250.01) HYPERLIPIDEMIA (ICD-272.4) HYPERTENSION (ICD-401.9) SLEEP APNEA Vein disease  Past Surgical History: partial hysterectomy extraction of left kidney stone lithotryspy and stent replacement rt ureter 10/2004, 12/2009 Dr Michela Pitcher Removal of lump from posterior R thigh  Bilateral stone removal with nephrostomy at [facility] hospital Octber 2011  Family History: Reviewed history from 10/31/2009 and no changes required. Mom HTN,Dm,CHF, has end stage dementia in [place], in hospice for 1 month, died in Sep 22, 2009 age 12 Dad deceased empyzema,HTN Sister 3 HTN< Brother 1 DM,GERD FH of Cancer:  Family History of Diabetes Family History Coronary Heart Disease female < 79 Family History of Arthritis  Social History:  Reviewed history from 05/17/2009 and no changes required. Divorced retired Two children  Never Smoked Alcohol use-no Drug use-no 3 cups of caffeine per day  Review of Systems      See HPI General:  Complains of fatigue. Eyes:  Denies blurring and discharge. MS:  Complains of joint pain, low back pain, mid back pain, and stiffness; chronic. Endo:  Denies cold intolerance, excessive hunger, excessive thirst, and excessive  urination; fasting sugars seldom over 120. Heme:  Denies abnormal bruising and bleeding. Allergy:  Complains of seasonal allergies; denies hives or rash and itching eyes.  Physical Exam  General:  Well-developed,well-nourished,in no acute distress; alert,appropriate and cooperative throughout examination HEENT: No facial asymmetry,  EOMI, No sinus tenderness, TM's Clear, oropharynx  pink and moist.   Chest: Clear to auscultation bilaterally.  CVS: S1, S2, No murmurs, No S3.   Abd: Soft, Nontender.  MS: Adequate ROM spine, hips, shoulders and knees.  Ext: No edema.   CNS: CN 2-12 intact, power tone and sensation normal throughout.   Skin: Intact, no visible lesions or rashes.  Psych: Good eye contact, normal affect.  Memory intact, not anxious or depressed appearing.   Diabetes Management Exam:    Foot Exam (with socks and/or shoes not present):       Sensory-Monofilament:          Left foot: normal          Right foot: normal       Inspection:          Left foot: abnormal             Comments: tinea pedis with cracking          Right foot: abnormal             Comments: tinea pedis with cracking       Nails:          Left foot: normal          Right foot: normal   Impression & Recommendations:  Problem # 1:  DIABETES MELLITUS, TYPE II (ICD-250.00) Assessment Comment Only  Her updated medication list for this problem includes:    Metformin Hcl 500 Mg Tabs (Metformin hcl) .Marland Kitchen... Take  two  tablet by mouth two times a day    Aspir-low 81 Mg Tbec (Aspirin) ..... One tab by mouth once daily Patient advised to reduce carbs and sweets, commit to regular physical activity, take meds as prescribed, test blood sugars as directed, and attempt to lose weight , to improve blood sugar control.  Labs Reviewed: Creat: 1.24 (01/24/2010)    Reviewed HgBA1c results: 6.8 (01/25/2010)  6.3 (08/26/2009)  Problem # 2:  ANXIETY DISORDER (ICD-300.00) Assessment: Improved  Her updated  medication list for this problem includes:    Paroxetine Hcl 40 Mg Tabs (Paroxetine hcl) .Marland Kitchen... Take 1 tablet by mouth once a day    Alprazolam 0.5 Mg Tabs (Alprazolam) .Marland Kitchen... Take 1 tablet by mouth two times a day  Orders: Medicare Electronic Prescription [phone])  Problem # 3:  HYPERTENSION (ICD-401.9) Assessment: Improved  Her updated medication list for this problem includes:    Amlodipine Besylate 10 Mg Tabs (Amlodipine besylate) .Marland Kitchen... Take one tab by mouth once daily  Orders: Medicare Electronic Prescription ([id number]) T-Basic Metabolic Panel (99991111)  BP today: 130/80 Prior BP: 134/88 (03/20/2010)  Prior 10 Yr Risk Heart Disease: 11 % (04/21/2009)  Labs Reviewed: K+: 4.4 (01/24/2010) Creat: : 1.24 (01/24/2010)   Chol: 141 (08/26/2009)  HDL: 45 (08/26/2009)   LDL: 64 (08/26/2009)   TG: 158 (08/26/2009)  Problem # 4:  HYPERLIPIDEMIA (ICD-272.4) Assessment: Comment Only  Her updated medication list for this problem includes:    Lovastatin 40 Mg Tabs (Lovastatin) ..... One tab by mouth qd  Orders: Medicare Electronic Prescription [phone]) T-Lipid Profile [phone]) T-Hepatic Function [phone]) Low fat dietdiscussed and encouraged  Labs Reviewed: SGOT: 11 (05/09/2009)   SGPT: 9 (05/09/2009)  Prior 10 Yr Risk Heart Disease: 11 % (04/21/2009)   HDL:45 (08/26/2009), 42 (05/09/2009)  LDL:64 (08/26/2009), 102 (05/09/2009)  Chol:141 (08/26/2009), 179 (05/09/2009)  Trig:158 (08/26/2009), 177 (05/09/2009)  Complete Medication List: 1)  Metformin Hcl 500 Mg Tabs (Metformin hcl) .... Take  two  tablet by mouth two times a day 2)  Potassium Citrate 540 Mg Tbcr (Potassium citrate) .... Take 1 tablet by mouth two times a day 3)  Omeprazole 20 Mg Tbec (Omeprazole) .... Take 1 tablet by mouth twice a day 4)  Amlodipine Besylate 10 Mg Tabs (Amlodipine besylate) .... Take one tab by mouth once daily 5)  Tramadol Hcl 50 Mg Tabs (Tramadol hcl) .... Take 1 tab bid 6)  Lovastatin  40 Mg Tabs (Lovastatin) .... One tab by mouth qd 7)  Tylenol 325 Mg Tabs (Acetaminophen) .... 2 at bedtime 8)  Paroxetine Hcl 40 Mg Tabs (Paroxetine hcl) .... Take 1 tablet by mouth once a day 9)  Alprazolam 0.5 Mg Tabs (Alprazolam) .... Take 1 tablet by mouth two times a day 10)  Ferretts 325 (106 Fe) Mg Tabs (Ferrous fumarate) .... One tab by mouth once daily 11)  Aspir-low 81 Mg Tbec (Aspirin) .... One tab by mouth once daily  Other Orders: T- Hemoglobin A1C [id number]) T- Hemoglobin A1C [id number]) Pneumococcal Vaccine [id number]) Admin 1st Vaccine [id number])  Patient Instructions: 1)  Please schedule a follow-up appointment in 4.5 months. 2)  It is important that you exercise regularly at least 20 minutes 5 times a week. If you develop chest pain, have severe difficulty breathing, or feel very tired , stop exercising immediately and seek medical attention. 3)  You need to lose weight. Consider a lower calorie diet and regular exercise.  4)  HbgA1C prior to visit, ICD-9: 5)  BMP prior to visit, ICD-9: 6)  Hepatic Panel prior to visit, ICD-9:  fasting asap 7)  Lipid Panel prior to visit, ICD-9: 8)  Pneumovac today 9)  HBA1C in 4.5 months Prescriptions: PAROXETINE HCL 40 MG TABS (PAROXETINE HCL) Take 1 tablet by mouth once a day  #30 Tablet x 3   Entered by:   Baldomero Lamy LPN   Authorized by:   Tula Nakayama MD   Signed by:   Baldomero Lamy LPN on [id number]   Method used:   Electronically to        [facility]. [phone]* (retail)       [address]       [place], [place]  [address]       Ph: [phone] or [phone]       Fax: [phone]   [id number] LOVASTATIN 40 MG TABS (LOVASTATIN) one tab by mouth qd  #30 Tablet x 3   Entered by:   Baldomero Lamy LPN   Authorized by:   Tula Nakayama MD   Signed by:   Baldomero Lamy LPN on QA348G   Method used:   Electronically to        [facility]. #  [facility] [place] (retail)       [address]       [place], [place]  [address]       Ph: [phone] or [phone]       Fax: [phone]   RxID:   [id number] AMLODIPINE BESYLATE 10 MG TABS (AMLODIPINE BESYLATE) take one tab by mouth once daily  #30 Tablet x 3   Entered by:   Baldomero Lamy LPN   Authorized by:   Tula Nakayama MD   Signed by:   Baldomero Lamy LPN on QA348G   Method used:   Electronically to        [facility]. [phone]* (retail)       [address]       [place], [place]  [address]       Ph: [phone] or [phone]       Fax: [phone]   [id number] OMEPRAZOLE 20 MG  TBEC (OMEPRAZOLE) Take 1 tablet by mouth twice a day  #60 Capsule x 3   Entered by:   Baldomero Lamy LPN   Authorized by:   Tula Nakayama MD   Signed by:   Baldomero Lamy LPN on QA348G   Method used:   Electronically to        [facility]. [phone]* (retail)       [address]       [place], [place]  [address]       Ph: [phone] or [phone]       Fax: [phone]   [id number] POTASSIUM CITRATE 540 MG  TBCR (POTASSIUM CITRATE) Take 1 tablet by mouth two times a day  #60 Tablet x 3   Entered by:   Baldomero Lamy LPN   Authorized by:   Tula Nakayama MD   Signed by:   Baldomero Lamy LPN on QA348G   Method used:   Electronically to        [facility]. [phone]* (retail)       [address]       [place], [place]  [address]       Ph: [phone] or [phone]       Fax: [phone]   [id number] METFORMIN HCL 500 MG  TABS (METFORMIN HCL) Take  two  tablet by mouth two times a day  #120 Tablet x 3   Entered by:   Baldomero Lamy LPN   Authorized by:   Tula Nakayama MD   Signed by:   Baldomero Lamy LPN on QA348G   Method used:   [facility]        [facility] [phone]* (retail)       [address]       [place],   [address]       Ph: [phone] or [phone]       Fax: [phone]   RxID:    [id number]    Orders Added: 1)  Est. Patient Level IV [id number] 2)  Medicare Electronic Prescription [id number] 3)  T-Basic Metabolic Panel 0000000 4)  T-Lipid Profile [80061-22930] 5)  T-Hepatic Function [80076-22960] 6)  T- Hemoglobin A1C [83036-23375] 7)  T- Hemoglobin A1C [83036-23375] 8)  Pneumococcal Vaccine [907[address]] 9)  Admin 1st Vaccine [id number]  Immunizations Administered:  Pneumonia Vaccine:    Vaccine Type: Pneumovax    Site: right deltoid    Mfr: Merck    Dose: 0.5 ml    Route: IM    Given by: Baldomero Lamy LPN    Exp. Date: 08/28/2011    Lot #: [id number]    VIS given: 05/16/09 version given May 23, 2010.   Immunizations Administered:  Pneumonia Vaccine:    Vaccine Type: Pneumovax    Site: right deltoid    Mfr: Merck    Dose: 0.5 ml    Route: IM    Given by: Baldomero Lamy LPN    Exp. Date: 08/28/2011    Lot #: [id number]    VIS given: 05/16/09 version given May 23, 2010.

## 2010-07-13 NOTE — Assessment & Plan Note (Signed)
Summary: F/U ON SWOLLEN LYMPH NODE/SLJ   Vital Signs:  Patient profile:   65 year old female Menstrual status:  hysterectomy Height:      66.5 inches Weight:      206 pounds BMI:     32.87 O2 Sat:      94 % Pulse rate:   104 / minute Pulse rhythm:   regular Resp:     16 per minute BP sitting:   134 / 84  (left arm) Cuff size:   large  Vitals Entered By: Kate Sable LPN (January 12, [id number] 1:03 PM) CC: Follow up chronic problems   Primary Care Provider:  Moshe Cipro, M.D.  CC:  Follow up chronic problems.  History of Present Illness: Recently treateed in Ed with antibiotics for swollen left posterior neck node. She still has engd and mildly tender left post occipital node, no fever or chill, no sore throat or cough, no local skin infection. She does have tinea pedis Reports  that she has otherwise been doing well. doing well. Denies recent fever or chills. Denies sinus pressure, nasal congestion , ear pain or sore throat. Denies chest congestion, or cough productive of sputum. Denies chest pain, palpitations, PND, orthopnea or leg swelling. Denies abdominal pain, nausea, vomitting, diarrhea or constipation. Denies change in bowel movements or bloody stool. Denies dysuria , frequency, incontinence or hesitancy. Denies  joint pain, swelling, or reduced mobility. Denies headaches, vertigo, seizures. Denies depression, anxiety or insDenies  rash, lesions, or itch.     Current Medications (verified): 1)  Metformin Hcl 500 Mg  Tabs (Metformin Hcl) .... Take  Two  Tablet By Mouth Two Times A Day 2)  Potassium Citrate 540 Mg  Tbcr (Potassium Citrate) .... Take 1 Tablet By Mouth Two Times A Day 3)  Omeprazole 20 Mg  Tbec (Omeprazole) .... Take 1 Tablet By Mouth Twice A Day 4)  Amlodipine Besylate 10 Mg Tabs (Amlodipine Besylate) .... Take One Tab By Mouth Once Daily 5)  Tramadol Hcl 50 Mg Tabs (Tramadol Hcl) .... Take 1 Tab Bid 6)  Lovastatin 40 Mg Tabs (Lovastatin) .... One Tab By  Mouth Qd 7)  Tylenol 325 Mg Tabs (Acetaminophen) .... 2 At Bedtime 8)  Paroxetine Hcl 40 Mg Tabs (Paroxetine Hcl) .... Take 1 Tablet By Mouth Once A Day 9)  Alprazolam 0.5 Mg Tabs (Alprazolam) .... Take 1 Tablet By Mouth Two Times A Day 10)  Ferretts 325 (106 Fe) Mg Tabs (Ferrous Fumarate) .... One Tab By Mouth Once Daily 11)  Aspir-Low 81 Mg Tbec (Aspirin) .... One Tab By Mouth Once Daily 12)  Colace 100 Mg Caps (Docusate Sodium) .... Take 1 Tablet By Mouth Two Times A Day 13)  Clindamycin Hcl 300 Mg Caps (Clindamycin Hcl) .... Take 1 Tablet By Mouth Three Times A Day  Allergies (verified): 1)  ! Penicillin 2)  ! Macrobid  Review of Systems      See HPI Eyes:  Denies discharge and red eye. Derm:  Complains of itching and rash; fungal infection on sole of foot. Endo:  Denies excessive thirst and excessive urination. Heme:  Denies abnormal bruising, bleeding, and fevers. Allergy:  Complains of seasonal allergies.  Physical Exam  General:  Well-developed,well-nourished,in no acute distress; alert,appropriate and cooperative throughout examination HEENT: No facial asymmetry,  EOMI, No sinus tenderness, TM's Clear, oropharynx  pink and moist. Left occipital adenitis  Chest: Clear to auscultation bilaterally.  CVS: S1, S2, No murmurs, No S3.   Abd: Soft, Nontender.  MS:  Adequate ROM spine, hips, shoulders and knees.  Ext: No edema.   CNS: CN 2-12 intact, power tone and sensation normal throughout. tinea pedisno visible lesions or rashes.  Psych: Good eye contact, normal affect.  Memory intact, not anxious or depressed appearing.    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 Apply twice daily to soles of feet over the heels for 3 weeks , then as needed  Orders: Medicare Electronic Prescription [id number])  Problem # 3:  HYPERTENSION (ICD-401.9) Assessment: Unchanged  Her updated medication list for this problem includes:    Amlodipine Besylate 10 Mg Tabs (Amlodipine besylate) .Marland Kitchen... Take one tab by mouth once daily  BP today: 134/84 Prior BP: 130/80 (05/23/2010)  Prior 10 Yr Risk Heart Disease: 11 % (04/21/2009)  Labs Reviewed: K+: 4.4 (05/26/2010) Creat: : 0.88 (05/26/2010)   Chol: 163 (05/26/2010)   HDL: 43 (05/26/2010)   LDL: 86 (05/26/2010)   TG: 172 (05/26/2010)  Problem # 4:  DIABETES MELLITUS, TYPE II (ICD-250.00)  Her updated medication list for this problem includes:    Metformin Hcl 500 Mg Tabs (Metformin hcl) .Marland Kitchen... Take  two  tablet by mouth two times a day    Aspir-low 81 Mg Tbec (Aspirin) ..... One tab by mouth once daily  Labs Reviewed: Creat: 0.88 (05/26/2010)    Reviewed HgBA1c results: 6.4 (05/26/2010)  6.8 (01/25/2010)  Complete Medication List: 1)  Metformin Hcl 500 Mg Tabs (Metformin hcl) .... Take  two  tablet by mouth two times a day 2)  Potassium Citrate 540 Mg Tbcr (Potassium citrate) .... Take 1 tablet by mouth two times a day 3)  Omeprazole 20 Mg Tbec (Omeprazole) .... Take 1 tablet by mouth twice a day 4)  Amlodipine Besylate 10 Mg Tabs (Amlodipine besylate) .... Take one tab by mouth once daily 5)  Tramadol Hcl 50 Mg Tabs (Tramadol hcl) .... Take 1 tab bid 6)  Lovastatin 40 Mg Tabs (Lovastatin) .... One tab by mouth qd 7)  Tylenol 325 Mg Tabs (Acetaminophen) .... 2 at bedtime 8)  Paroxetine Hcl 40 Mg Tabs (Paroxetine hcl) .... Take 1 tablet by mouth once a day 9)  Alprazolam 0.5 Mg Tabs (Alprazolam) .... Take 1 tablet by mouth two times a day 10)  Ferretts 325 (106 Fe) Mg Tabs (Ferrous fumarate) .... One tab by mouth once daily 11)  Aspir-low 81 Mg Tbec (Aspirin) .... One  tab by mouth once daily 12)  Colace 100 Mg Caps (Docusate sodium) .... Take 1 tablet by mouth two times a day 13)  Clotrimazole-betamethasone 1-0.05 % Crea (Clotrimazole-betamethasone) .... Apply twice daily to soles of feet over the heels for 3 weeks , then as needed  Patient Instructions: 1)  F/U in 4 weeks 2)  You still have an enlarged gland in the back of your neck, please complete the entire 20days of the clindamycin prescribed in the Ed 3)  I am sending in a new med for your fungal infection on the feet Prescriptions: ALPRAZOLAM 0.5 MG TABS (ALPRAZOLAM) Take 1 tablet by mouth two times a day  #60 x 5   Entered by:   Kate Sable LPN   Authorized by:   Tula Nakayama MD  Signed by:   Kate Sable LPN on 075-GRM   Method used:   Printed then faxed to ...       [facility] [phone]* (retail)       [address]       [place], [place]  [address]       Ph: [phone]       Fax: [phone]   RxID:   [id number] LOVASTATIN 40 MG TABS (LOVASTATIN) one tab by mouth qd  #30 Tablet x 5   Entered by:   Kate Sable LPN   Authorized by:   Tula Nakayama MD   Signed by:   Kate Sable LPN on 075-GRM   Method used:   Electronically to        [facility]. [phone]* (retail)       [address]       [place], [place]  [address]       Ph: [phone]       Fax: [phone]   RxID:   [id number] OMEPRAZOLE 20 MG  TBEC (OMEPRAZOLE) Take 1 tablet by mouth twice a day  #60 Capsule x 5   Entered by:   Kate Sable LPN   Authorized by:   Tula Nakayama MD   Signed by:   Kate Sable LPN on 075-GRM   Method used:   Electronically to        [facility]. [phone]* (retail)       [address]       [place], [place]  [address]       Ph: [phone]       Fax: [phone]   RxID:   [id number] POTASSIUM CITRATE 540 MG  TBCR (POTASSIUM CITRATE) Take 1 tablet by mouth two times a day  #60 Tablet x 5   Entered by:    Kate Sable LPN   Authorized by:   Tula Nakayama MD   Signed by:   Kate Sable LPN on 075-GRM   Method used:   Electronically to        [facility]. [phone]* (retail)       [address]       [place], [place]  [address]       Ph: [phone]       Fax: [phone]   RxID:   [id number] METFORMIN HCL 500 MG  TABS (METFORMIN HCL) Take  two  tablet by mouth two times a day  #120 Tablet x 5   Entered by:   Kate Sable LPN   Authorized by:   Tula Nakayama MD   Signed by:   Kate Sable LPN on 075-GRM   Method used:   [facility]ronically to        [facility] [phone]* (retail)       [address]       [place],   [address]       Ph: [phone]       Fax: [phone]   RxID:   [id number] CLOTRIMAZOLE-BETAMETHASONE 1-0.05 % CREA (CLOTRIMAZOLE-BETAMETHASONE) apply twice daily to soles of feet over the heels for 3 weeks , then as needed  #45 gm x 1   Entered and Authorized by:   Tula Nakayama MD   Signed by:  Tula Nakayama MD on 06/22/2010   Method used:   Electronically to        [facility]. [phone]* (retail)       [address]       [place], [place]  [address]       Ph: [phone]       Fax: [phone]   RxID:   [id number]    Orders Added: 1)  Est. Patient Level III [id number] 2)  Medicare Electronic Prescription [phone]

## 2010-07-19 NOTE — Progress Notes (Signed)
Summary: does she need a [place]  Phone Note Call from Patient   Summary of Call: patient called and stated that you wanted her to do a [place] for her lymph nodes in her neck  please call back and let her know @ [phone] Initial call taken by: Dierdre Harness,  July 11, 2010 1:24 PM  Follow-up for Phone Call        needs office re-eval before Ultrasound, has appt mid Feb pls let her know Follow-up by: Tula Nakayama MD,  July 11, 2010 4:58 PM  Additional Follow-up for Phone Call Additional follow up Details #1::        Phone Call Completed Additional Follow-up by: Baldomero Lamy LPN,  February  2, [id number] 11:10 AM

## 2010-07-27 ENCOUNTER — Encounter: Payer: Self-pay | Admitting: Family Medicine

## 2010-07-27 ENCOUNTER — Ambulatory Visit (INDEPENDENT_AMBULATORY_CARE_PROVIDER_SITE_OTHER): Payer: Medicare Other | Admitting: Family Medicine

## 2010-07-27 DIAGNOSIS — F411 Generalized anxiety disorder: Secondary | ICD-10-CM

## 2010-07-27 DIAGNOSIS — D179 Benign lipomatous neoplasm, unspecified: Secondary | ICD-10-CM | POA: Insufficient documentation

## 2010-07-27 DIAGNOSIS — E119 Type 2 diabetes mellitus without complications: Secondary | ICD-10-CM

## 2010-07-27 DIAGNOSIS — E785 Hyperlipidemia, unspecified: Secondary | ICD-10-CM

## 2010-07-27 DIAGNOSIS — R599 Enlarged lymph nodes, unspecified: Secondary | ICD-10-CM | POA: Insufficient documentation

## 2010-07-27 DIAGNOSIS — I10 Essential (primary) hypertension: Secondary | ICD-10-CM

## 2010-07-28 ENCOUNTER — Other Ambulatory Visit: Payer: Self-pay | Admitting: Family Medicine

## 2010-07-28 DIAGNOSIS — R591 Generalized enlarged lymph nodes: Secondary | ICD-10-CM

## 2010-07-28 DIAGNOSIS — R221 Localized swelling, mass and lump, neck: Secondary | ICD-10-CM

## 2010-07-28 DIAGNOSIS — M79622 Pain in left upper arm: Secondary | ICD-10-CM

## 2010-07-28 LAB — CONVERTED CEMR LAB: Hgb A1c MFr Bld: 6.6 % — ABNORMAL HIGH (ref <5.7)

## 2010-08-03 ENCOUNTER — Ambulatory Visit (HOSPITAL_COMMUNITY)
Admission: RE | Admit: 2010-08-03 | Discharge: 2010-08-03 | Disposition: A | Discharge status: Home / Self Care | Payer: Medicare Other | Source: Ambulatory Visit | Attending: Family Medicine | Admitting: Family Medicine

## 2010-08-03 DIAGNOSIS — R591 Generalized enlarged lymph nodes: Secondary | ICD-10-CM

## 2010-08-03 DIAGNOSIS — R221 Localized swelling, mass and lump, neck: Secondary | ICD-10-CM

## 2010-08-03 DIAGNOSIS — M79622 Pain in left upper arm: Secondary | ICD-10-CM

## 2010-08-03 DIAGNOSIS — R220 Localized swelling, mass and lump, head: Secondary | ICD-10-CM | POA: Insufficient documentation

## 2010-08-08 NOTE — Assessment & Plan Note (Signed)
Summary: follow up   Vital Signs:  Patient profile:   65 year old female Menstrual status:  hysterectomy Height:      66.5 inches Weight:      209.25 pounds BMI:     33.39 O2 Sat:      96 % Pulse rate:   100 / minute Pulse rhythm:   regular Resp:     16 per minute BP sitting:   130 / 80  (left arm) Cuff size:   large  Vitals Entered By: Kate Sable LPN (February 16, [id number] 1:35 PM)  Nutrition Counseling: Patient's BMI is greater than 25 and therefore counseled on weight management options. CC: ER Follow up chronic problems, swollen glands in neck   Primary Care Provider:  Moshe Cipro, M.D.  CC:  ER Follow up chronic problems and swollen glands in neck.  History of Present Illness: still has left post neck swelling, mildly tender to palpation, no response to antibiotics, has been present for approx 6 weeks  c/o left upper arm swelling, painless present for past month . She denies any recent fever or chills. Reports  that she has otherwise been doing well Denies recent fever or chills. Denies sinus pressure, nasal congestion , ear pain or sore throat. Denies chest congestion, or cough productive of sputum. Denies chest pain, palpitations, PND, orthopnea or leg swelling. Denies abdominal pain, nausea, vomitting, diarrhea or constipation. Denies change in bowel movements or bloody stool. Denies dysuria , frequency, incontinence or hesitancy. Denies  joint pain, swelling, or reduced mobility. Denies headaches, vertigo, seizures. Denies uncontrolled depression, anxiety or insomnia. Denies  rash, lesions, or itch.     Current Medications (verified): 1)  Metformin Hcl 500 Mg  Tabs (Metformin Hcl) .... Take  Two  Tablet By Mouth Two Times A Day 2)  Potassium Citrate 540 Mg  Tbcr (Potassium Citrate) .... Take 1 Tablet By Mouth Two Times A Day 3)  Omeprazole 20 Mg  Tbec (Omeprazole) .... Take 1 Tablet By Mouth Twice A Day 4)  Amlodipine Besylate 10 Mg Tabs (Amlodipine Besylate) ....  Take One Tab By Mouth Once Daily 5)  Tramadol Hcl 50 Mg Tabs (Tramadol Hcl) .... Take 1 Tab Bid 6)  Lovastatin 40 Mg Tabs (Lovastatin) .... One Tab By Mouth Qd 7)  Tylenol 325 Mg Tabs (Acetaminophen) .... 2 At Bedtime 8)  Paroxetine Hcl 40 Mg Tabs (Paroxetine Hcl) .... Take 1 Tablet By Mouth Once A Day 9)  Alprazolam 0.5 Mg Tabs (Alprazolam) .... Take 1 Tablet By Mouth Two Times A Day 10)  Ferretts 325 (106 Fe) Mg Tabs (Ferrous Fumarate) .... One Tab By Mouth Once Daily 11)  Aspir-Low 81 Mg Tbec (Aspirin) .... One Tab By Mouth Once Daily 12)  Colace 100 Mg Caps (Docusate Sodium) .... Take 1 Tablet By Mouth Two Times A Day 13)  Clotrimazole-Betamethasone 1-0.05 % Crea (Clotrimazole-Betamethasone) .... Apply Twice Daily To Soles of Feet Over The Heels For 3 Weeks , Then As Needed 14)  Clindamycin Hcl 300 Mg Caps (Clindamycin Hcl) .... Take 1 Tablet By Mouth Three Times A Day  Allergies (verified): 1)  ! Penicillin 2)  ! Macrobid  Review of Systems      See HPI General:  Complains of fatigue. Eyes:  Denies discharge and red eye. MS:  Complains of joint pain, low back pain, mid back pain, and stiffness. Psych:  Complains of anxiety, depression, and mental problems; denies suicidal thoughts/plans, thoughts of violence, unusual visions or sounds, and thoughts /plans of  harming others. Endo:  Denies cold intolerance, excessive hunger, excessive thirst, excessive urination, and polyuria. Heme:  Complains of enlarge lymph nodes; denies abnormal bruising, bleeding, and fevers. Allergy:  Denies hives or rash and itching eyes.  Physical Exam  General:  Well-developed,well-nourished,in no acute distress; alert,appropriate and cooperative throughout examination HEENT: No facial asymmetry,  EOMI, No sinus tenderness, TM's Clear, oropharynx  pink and moist. Lef posteror cervical adenopathy  Chest: Clear to auscultation bilaterally.  CVS: S1, S2, No murmurs, No S3.   Abd: Soft, Nontender.  MS:  Adequate ROM spine, hips, shoulders and knees.  Ext: No edema.   CNS: CN 2-12 intact, power tone and sensation normal throughout.   Skin: Intact, lipoma on left upper arm. Psych: Good eye contact, normal affect.  Memory intact, not anxious or depressed appearing.     Impression & Recommendations:  Problem # 1:  LIPOMA (ICD-214.9) Assessment Comment Only  Orders: Surgical Referral (Surgery)Future Orders: Radiology Referral (Radiology) ... 07/28/2010  Problem # 2:  LYMPHADENOPATHY (P7382067.6) Assessment: Comment Only  Her updated medication list for this problem includes:    Clindamycin Hcl 300 Mg Caps (Clindamycin hcl) .Marland Kitchen... Take 1 tablet by mouth three times a day  Orders: Surgical Referral (Surgery)Future Orders: Radiology Referral (Radiology) ... 07/28/2010  Problem # 3:  DIABETES MELLITUS, TYPE II (ICD-250.00) Assessment: Comment Only  Her updated medication list for this problem includes:    Metformin Hcl 500 Mg Tabs (Metformin hcl) .Marland Kitchen... Take  two  tablet by mouth two times a day    Aspir-low 81 Mg Tbec (Aspirin) ..... One tab by mouth once daily  Orders: Medicare Electronic Prescription [phone]) T- Hemoglobin A1C [phone])  Labs Reviewed: Creat: 0.88 (05/26/2010)    Reviewed HgBA1c results: 6.4 (05/26/2010)  6.8 (01/25/2010)  Problem # 4:  ANXIETY DISORDER (ICD-300.00) Assessment: Unchanged  Her updated medication list for this problem includes:    Paroxetine Hcl 40 Mg Tabs (Paroxetine hcl) .Marland Kitchen... Take 1 tablet by mouth once a day    Alprazolam 0.5 Mg Tabs (Alprazolam) .Marland Kitchen... Take 1 tablet by mouth two times a day  Problem # 5:  HYPERTENSION (ICD-401.9) Assessment: Improved  Her updated medication list for this problem includes:    Amlodipine Besylate 10 Mg Tabs (Amlodipine besylate) .Marland Kitchen... Take one tab by mouth once daily  Orders: Medicare Electronic Prescription [phone])  BP today: 130/80 Prior BP: 134/84 (06/22/2010)  Prior 10 Yr Risk Heart  Disease: 11 % (04/21/2009)  Labs Reviewed: K+: 4.4 (05/26/2010) Creat: : 0.88 (05/26/2010)   Chol: 163 (05/26/2010)   HDL: 43 (05/26/2010)   LDL: 86 (05/26/2010)   TG: 172 (05/26/2010)  Problem # 6:  HYPERLIPIDEMIA (ICD-272.4) Assessment: Comment Only  Her updated medication list for this problem includes:    Lovastatin 40 Mg Tabs (Lovastatin) ..... One tab by mouth qd Low fat dietdiscussed and encouraged  Labs Reviewed: SGOT: 12 (05/26/2010)   SGPT: 9 (05/26/2010)  Prior 10 Yr Risk Heart Disease: 11 % (04/21/2009)   HDL:43 (05/26/2010), 45 (08/26/2009)  LDL:86 (05/26/2010), 64 (08/26/2009)  Chol:163 (05/26/2010), 141 (08/26/2009)  Trig:172 (05/26/2010), 158 (08/26/2009)  Complete Medication List: 1)  Metformin Hcl 500 Mg Tabs (Metformin hcl) .... Take  two  tablet by mouth two times a day 2)  Potassium Citrate 540 Mg Tbcr (Potassium citrate) .... Take 1 tablet by mouth two times a day 3)  Omeprazole 20 Mg Tbec (Omeprazole) .... Take 1 tablet by mouth twice a day 4)  Amlodipine Besylate 10 Mg Tabs (Amlodipine besylate) .Marland KitchenMarland KitchenMarland Kitchen  Take one tab by mouth once daily 5)  Tramadol Hcl 50 Mg Tabs (Tramadol hcl) .... Take 1 tab bid 6)  Lovastatin 40 Mg Tabs (Lovastatin) .... One tab by mouth qd 7)  Tylenol 325 Mg Tabs (Acetaminophen) .... 2 at bedtime 8)  Paroxetine Hcl 40 Mg Tabs (Paroxetine hcl) .... Take 1 tablet by mouth once a day 9)  Alprazolam 0.5 Mg Tabs (Alprazolam) .... Take 1 tablet by mouth two times a day 10)  Ferretts 325 (106 Fe) Mg Tabs (Ferrous fumarate) .... One tab by mouth once daily 11)  Aspir-low 81 Mg Tbec (Aspirin) .... One tab by mouth once daily 12)  Colace 100 Mg Caps (Docusate sodium) .... Take 1 tablet by mouth two times a day 13)  Clotrimazole-betamethasone 1-0.05 % Crea (Clotrimazole-betamethasone) .... Apply twice daily to soles of feet over the heels for 3 weeks , then as needed 14)  Clindamycin Hcl 300 Mg Caps (Clindamycin hcl) .... Take 1 tablet by mouth three  times a day  Patient Instructions: 1)  Please schedule a follow-up appointment in 3.5 months. 2)  It is important that you exercise regularly at least 30 minutes 5 times a week. If you develop chest pain, have severe difficulty breathing, or feel very tired , stop exercising immediately and seek medical attention. 3)  You need to lose weight. Consider a lower calorie diet and regular exercise.  4)  HbgA1C prior to visit, ICD-9: 5)  you are referred for ultrasound of your neck and upper left arm to eval swelling and to see the surgeon Prescriptions: TRAMADOL HCL 50 MG TABS (TRAMADOL HCL) Take 1 tab bid  #60 Tablet x 3   Entered by:   Kate Sable LPN   Authorized by:   Tula Nakayama MD   Signed by:   Kate Sable LPN on 579FGE   Method used:   Electronically to        [facility] [phone]* (retail)       [address]       [place], [place]  [address]       Ph: [phone]       Fax: [phone]   RxID:   [id number] AMLODIPINE BESYLATE 10 MG TABS (AMLODIPINE BESYLATE) take one tab by mouth once daily  #30 Tablet x 3   Entered by:   Kate Sable LPN   Authorized by:   Tula Nakayama MD   Signed by:   Kate Sable LPN on 579FGE   Method used:   Electronically to        [facility] [phone]* (retail)       [address]       [place], [place]  [address]       Ph: [phone]       Fax: [phone]   RxID:   [id number] OMEPRAZOLE 20 MG  TBEC (OMEPRAZOLE) Take 1 tablet by mouth twice a day  #60 Capsule x 3   Entered by:   Kate Sable LPN   Authorized by:   Tula Nakayama MD   Signed by:   Kate Sable LPN on 579FGE   Method used:   Electronically to        [facility] [phone]* (retail)       [address]       [place], [place]  [address]  Ph: [phone]       Fax: [phone]   RxID:   [id number] POTASSIUM CITRATE 540 MG  TBCR (POTASSIUM CITRATE) Take 1 tablet by mouth two times a day   #60 Tablet x 3   Entered by:   Kate Sable LPN   Authorized by:   Tula Nakayama MD   Signed by:   Kate Sable LPN on 579FGE   Method used:   Electronically to        [facility]. [phone]* (retail)       [address]       [place], [place]  [address]       Ph: [phone]       Fax: [phone]   RxID:   [id number]    Orders Added: 1)  Est. Patient Level IV [id number] 2)  Medicare Electronic Prescription [id number])  Surgical Referral [Surgery] 4)  T- Hemoglobin A1C [83036-23375] 5)  Radiology Referral [Radiology] 6)  Radiology Referral [Radiology]

## 2010-08-09 ENCOUNTER — Other Ambulatory Visit (HOSPITAL_COMMUNITY): Payer: Self-pay | Admitting: General Surgery

## 2010-08-09 DIAGNOSIS — M7989 Other specified soft tissue disorders: Secondary | ICD-10-CM

## 2010-08-15 ENCOUNTER — Ambulatory Visit (HOSPITAL_COMMUNITY)
Admission: RE | Admit: 2010-08-15 | Discharge: 2010-08-15 | Disposition: A | Discharge status: Home / Self Care | Payer: Medicare Other | Source: Ambulatory Visit | Attending: General Surgery | Admitting: General Surgery

## 2010-08-15 DIAGNOSIS — M7989 Other specified soft tissue disorders: Secondary | ICD-10-CM

## 2010-08-15 DIAGNOSIS — IMO0002 Reserved for concepts with insufficient information to code with codable children: Secondary | ICD-10-CM | POA: Insufficient documentation

## 2010-08-15 DIAGNOSIS — M67919 Unspecified disorder of synovium and tendon, unspecified shoulder: Secondary | ICD-10-CM | POA: Insufficient documentation

## 2010-08-15 DIAGNOSIS — M25519 Pain in unspecified shoulder: Secondary | ICD-10-CM | POA: Insufficient documentation

## 2010-08-15 DIAGNOSIS — M75100 Unspecified rotator cuff tear or rupture of unspecified shoulder, not specified as traumatic: Secondary | ICD-10-CM | POA: Insufficient documentation

## 2010-08-15 DIAGNOSIS — M719 Bursopathy, unspecified: Secondary | ICD-10-CM | POA: Insufficient documentation

## 2010-08-15 MED ORDER — GADOBENATE DIMEGLUMINE 529 MG/ML IV SOLN: 19.0000 mL | Freq: Once | INTRAVENOUS | Status: AC | PRN

## 2010-08-25 LAB — CBC
HCT: 33.9 % — ABNORMAL LOW (ref 36.0–46.0)
HCT: 34.9 % — ABNORMAL LOW (ref 36.0–46.0)
Hemoglobin: 11.3 g/dL — ABNORMAL LOW (ref 12.0–15.0)
Hemoglobin: 11.6 g/dL — ABNORMAL LOW (ref 12.0–15.0)
MCH: 31.2 pg (ref 26.0–34.0)
MCH: 31.4 pg (ref 26.0–34.0)
MCHC: 33.1 g/dL (ref 30.0–36.0)
MCHC: 33.3 g/dL (ref 30.0–36.0)
MCV: 94.1 fL (ref 78.0–100.0)
MCV: 94.2 fL (ref 78.0–100.0)
Platelets: 300 10*3/uL (ref 150–400)
Platelets: 359 10*3/uL (ref 150–400)
RBC: 3.59 MIL/uL — ABNORMAL LOW (ref 3.87–5.11)
RBC: 3.71 MIL/uL — ABNORMAL LOW (ref 3.87–5.11)
RDW: 16.3 % — ABNORMAL HIGH (ref 11.5–15.5)
RDW: 16.4 % — ABNORMAL HIGH (ref 11.5–15.5)
WBC: 12.6 10*3/uL — ABNORMAL HIGH (ref 4.0–10.5)
WBC: 14.1 10*3/uL — ABNORMAL HIGH (ref 4.0–10.5)

## 2010-08-25 LAB — GLUCOSE, CAPILLARY
Glucose-Capillary: 103 mg/dL — ABNORMAL HIGH (ref 70–99)
Glucose-Capillary: 105 mg/dL — ABNORMAL HIGH (ref 70–99)
Glucose-Capillary: 109 mg/dL — ABNORMAL HIGH (ref 70–99)
Glucose-Capillary: 113 mg/dL — ABNORMAL HIGH (ref 70–99)
Glucose-Capillary: 117 mg/dL — ABNORMAL HIGH (ref 70–99)
Glucose-Capillary: 125 mg/dL — ABNORMAL HIGH (ref 70–99)
Glucose-Capillary: 125 mg/dL — ABNORMAL HIGH (ref 70–99)
Glucose-Capillary: 126 mg/dL — ABNORMAL HIGH (ref 70–99)
Glucose-Capillary: 136 mg/dL — ABNORMAL HIGH (ref 70–99)
Glucose-Capillary: 83 mg/dL (ref 70–99)
Glucose-Capillary: 96 mg/dL (ref 70–99)

## 2010-08-25 LAB — DIFFERENTIAL
Basophils Absolute: 0 10*3/uL (ref 0.0–0.1)
Basophils Absolute: 0 10*3/uL (ref 0.0–0.1)
Basophils Relative: 0 % (ref 0–1)
Basophils Relative: 0 % (ref 0–1)
Eosinophils Absolute: 0.1 10*3/uL (ref 0.0–0.7)
Eosinophils Absolute: 0.2 10*3/uL (ref 0.0–0.7)
Eosinophils Relative: 1 % (ref 0–5)
Eosinophils Relative: 2 % (ref 0–5)
Lymphocytes Relative: 10 % — ABNORMAL LOW (ref 12–46)
Lymphocytes Relative: 12 % (ref 12–46)
Lymphs Abs: 1.3 10*3/uL (ref 0.7–4.0)
Lymphs Abs: 1.5 10*3/uL (ref 0.7–4.0)
Monocytes Absolute: 0.9 10*3/uL (ref 0.1–1.0)
Monocytes Absolute: 1.5 10*3/uL — ABNORMAL HIGH (ref 0.1–1.0)
Monocytes Relative: 10 % (ref 3–12)
Monocytes Relative: 7 % (ref 3–12)
Neutro Abs: 10 10*3/uL — ABNORMAL HIGH (ref 1.7–7.7)
Neutro Abs: 11.3 10*3/uL — ABNORMAL HIGH (ref 1.7–7.7)
Neutrophils Relative %: 80 % — ABNORMAL HIGH (ref 43–77)
Neutrophils Relative %: 80 % — ABNORMAL HIGH (ref 43–77)

## 2010-08-25 LAB — BASIC METABOLIC PANEL
BUN: 4 mg/dL — ABNORMAL LOW (ref 6–23)
CO2: 22 mEq/L (ref 19–32)
Calcium: 9.4 mg/dL (ref 8.4–10.5)
Chloride: 106 mEq/L (ref 96–112)
Creatinine, Ser: 1.04 mg/dL (ref 0.4–1.2)
GFR calc Af Amer: >60 mL/min (ref >60)
GFR calc non Af Amer: 53 mL/min — ABNORMAL LOW (ref >60)
Glucose, Bld: 113 mg/dL — ABNORMAL HIGH (ref 70–99)
Potassium: 3.7 mEq/L (ref 3.5–5.1)
Sodium: 137 mEq/L (ref 135–145)

## 2010-08-26 LAB — DIFFERENTIAL
Basophils Absolute: 0 10*3/uL (ref 0.0–0.1)
Basophils Absolute: 0 10*3/uL (ref 0.0–0.1)
Basophils Absolute: 0 10*3/uL (ref 0.0–0.1)
Basophils Relative: 0 % (ref 0–1)
Basophils Relative: 0 % (ref 0–1)
Basophils Relative: 0 % (ref 0–1)
Eosinophils Absolute: 0.1 10*3/uL (ref 0.0–0.7)
Eosinophils Absolute: 0.4 10*3/uL (ref 0.0–0.7)
Eosinophils Absolute: 0.5 10*3/uL (ref 0.0–0.7)
Eosinophils Relative: 0 % (ref 0–5)
Eosinophils Relative: 3 % (ref 0–5)
Eosinophils Relative: 4 % (ref 0–5)
Lymphocytes Relative: 10 % — ABNORMAL LOW (ref 12–46)
Lymphocytes Relative: 5 % — ABNORMAL LOW (ref 12–46)
Lymphocytes Relative: 8 % — ABNORMAL LOW (ref 12–46)
Lymphs Abs: 0.8 10*3/uL (ref 0.7–4.0)
Lymphs Abs: 1.1 10*3/uL (ref 0.7–4.0)
Lymphs Abs: 1.2 10*3/uL (ref 0.7–4.0)
Monocytes Absolute: 1.1 10*3/uL — ABNORMAL HIGH (ref 0.1–1.0)
Monocytes Absolute: 1.2 10*3/uL — ABNORMAL HIGH (ref 0.1–1.0)
Monocytes Absolute: 1.3 10*3/uL — ABNORMAL HIGH (ref 0.1–1.0)
Monocytes Relative: 7 % (ref 3–12)
Monocytes Relative: 9 % (ref 3–12)
Monocytes Relative: 9 % (ref 3–12)
Neutro Abs: 10.7 10*3/uL — ABNORMAL HIGH (ref 1.7–7.7)
Neutro Abs: 13.7 10*3/uL — ABNORMAL HIGH (ref 1.7–7.7)
Neutro Abs: 9.6 10*3/uL — ABNORMAL HIGH (ref 1.7–7.7)
Neutrophils Relative %: 77 % (ref 43–77)
Neutrophils Relative %: 80 % — ABNORMAL HIGH (ref 43–77)
Neutrophils Relative %: 88 % — ABNORMAL HIGH (ref 43–77)

## 2010-08-26 LAB — CBC
HCT: 35 % — ABNORMAL LOW (ref 36.0–46.0)
HCT: 36.2 % (ref 36.0–46.0)
HCT: 36.3 % (ref 36.0–46.0)
Hemoglobin: 11.6 g/dL — ABNORMAL LOW (ref 12.0–15.0)
Hemoglobin: 12 g/dL (ref 12.0–15.0)
Hemoglobin: 12.1 g/dL (ref 12.0–15.0)
MCH: 31 pg (ref 26.0–34.0)
MCH: 31.1 pg (ref 26.0–34.0)
MCH: 31.3 pg (ref 26.0–34.0)
MCHC: 33.1 g/dL (ref 30.0–36.0)
MCHC: 33.1 g/dL (ref 30.0–36.0)
MCHC: 33.5 g/dL (ref 30.0–36.0)
MCV: 93.5 fL (ref 78.0–100.0)
MCV: 93.6 fL (ref 78.0–100.0)
MCV: 93.9 fL (ref 78.0–100.0)
Platelets: 265 10*3/uL (ref 150–400)
Platelets: 277 10*3/uL (ref 150–400)
Platelets: 278 10*3/uL (ref 150–400)
RBC: 3.72 MIL/uL — ABNORMAL LOW (ref 3.87–5.11)
RBC: 3.86 MIL/uL — ABNORMAL LOW (ref 3.87–5.11)
RBC: 3.88 MIL/uL (ref 3.87–5.11)
RDW: 16.9 % — ABNORMAL HIGH (ref 11.5–15.5)
RDW: 17.1 % — ABNORMAL HIGH (ref 11.5–15.5)
RDW: 17.4 % — ABNORMAL HIGH (ref 11.5–15.5)
WBC: 12.5 10*3/uL — ABNORMAL HIGH (ref 4.0–10.5)
WBC: 13.4 10*3/uL — ABNORMAL HIGH (ref 4.0–10.5)
WBC: 15.6 10*3/uL — ABNORMAL HIGH (ref 4.0–10.5)

## 2010-08-26 LAB — GLUCOSE, CAPILLARY
Glucose-Capillary: 102 mg/dL — ABNORMAL HIGH (ref 70–99)
Glucose-Capillary: 102 mg/dL — ABNORMAL HIGH (ref 70–99)
Glucose-Capillary: 103 mg/dL — ABNORMAL HIGH (ref 70–99)
Glucose-Capillary: 106 mg/dL — ABNORMAL HIGH (ref 70–99)
Glucose-Capillary: 115 mg/dL — ABNORMAL HIGH (ref 70–99)
Glucose-Capillary: 118 mg/dL — ABNORMAL HIGH (ref 70–99)
Glucose-Capillary: 128 mg/dL — ABNORMAL HIGH (ref 70–99)
Glucose-Capillary: 136 mg/dL — ABNORMAL HIGH (ref 70–99)
Glucose-Capillary: 98 mg/dL (ref 70–99)

## 2010-08-26 LAB — URINE MICROSCOPIC-ADD ON

## 2010-08-26 LAB — COMPREHENSIVE METABOLIC PANEL
ALT: 20 U/L (ref 0–35)
AST: 22 U/L (ref 0–37)
Albumin: 3.1 g/dL — ABNORMAL LOW (ref 3.5–5.2)
Alkaline Phosphatase: 77 U/L (ref 39–117)
BUN: 9 mg/dL (ref 6–23)
CO2: 26 mEq/L (ref 19–32)
Calcium: 9.1 mg/dL (ref 8.4–10.5)
Chloride: 102 mEq/L (ref 96–112)
Creatinine, Ser: 1.25 mg/dL — ABNORMAL HIGH (ref 0.4–1.2)
GFR calc Af Amer: 52 mL/min — ABNORMAL LOW (ref >60)
GFR calc non Af Amer: 43 mL/min — ABNORMAL LOW (ref >60)
Glucose, Bld: 114 mg/dL — ABNORMAL HIGH (ref 70–99)
Potassium: 3.4 mEq/L — ABNORMAL LOW (ref 3.5–5.1)
Sodium: 136 mEq/L (ref 135–145)
Total Bilirubin: 0.4 mg/dL (ref 0.3–1.2)
Total Protein: 7.4 g/dL (ref 6.0–8.3)

## 2010-08-26 LAB — URINE CULTURE
Colony Count: NO GROWTH
Culture: NO GROWTH

## 2010-08-26 LAB — CULTURE, BLOOD (ROUTINE X 2)
Culture: NO GROWTH
Culture: NO GROWTH
Report Status: 8032011
Report Status: 8032011

## 2010-08-26 LAB — BASIC METABOLIC PANEL
BUN: 6 mg/dL (ref 6–23)
BUN: 8 mg/dL (ref 6–23)
CO2: 23 mEq/L (ref 19–32)
CO2: 25 mEq/L (ref 19–32)
Calcium: 9.1 mg/dL (ref 8.4–10.5)
Calcium: 9.2 mg/dL (ref 8.4–10.5)
Chloride: 102 mEq/L (ref 96–112)
Chloride: 106 mEq/L (ref 96–112)
Creatinine, Ser: 1.22 mg/dL — ABNORMAL HIGH (ref 0.4–1.2)
Creatinine, Ser: 1.35 mg/dL — ABNORMAL HIGH (ref 0.4–1.2)
GFR calc Af Amer: 48 mL/min — ABNORMAL LOW (ref >60)
GFR calc Af Amer: 54 mL/min — ABNORMAL LOW (ref >60)
GFR calc non Af Amer: 39 mL/min — ABNORMAL LOW (ref >60)
GFR calc non Af Amer: 44 mL/min — ABNORMAL LOW (ref >60)
Glucose, Bld: 104 mg/dL — ABNORMAL HIGH (ref 70–99)
Glucose, Bld: 119 mg/dL — ABNORMAL HIGH (ref 70–99)
Potassium: 3.3 mEq/L — ABNORMAL LOW (ref 3.5–5.1)
Potassium: 4 mEq/L (ref 3.5–5.1)
Sodium: 135 mEq/L (ref 135–145)
Sodium: 137 mEq/L (ref 135–145)

## 2010-08-26 LAB — URINALYSIS, ROUTINE W REFLEX MICROSCOPIC
Bilirubin Urine: NEGATIVE
Glucose, UA: 100 mg/dL — AB
Ketones, ur: NEGATIVE mg/dL
Nitrite: NEGATIVE
Protein, ur: 100 mg/dL — AB
Specific Gravity, Urine: 1.02 (ref 1.005–1.030)
Urobilinogen, UA: 0.2 mg/dL (ref 0.0–1.0)
pH: 6.5 (ref 5.0–8.0)

## 2010-08-26 LAB — HEMOGLOBIN A1C
Hgb A1c MFr Bld: 6.6 % — ABNORMAL HIGH (ref <5.7)
Mean Plasma Glucose: 143 mg/dL — ABNORMAL HIGH (ref <117)

## 2010-08-26 LAB — TSH: TSH: 0.796 u[IU]/mL (ref 0.350–4.500)

## 2010-08-26 LAB — MAGNESIUM: Magnesium: 2.3 mg/dL (ref 1.5–2.5)

## 2010-08-26 LAB — BRAIN NATRIURETIC PEPTIDE: Pro B Natriuretic peptide (BNP): 43 pg/mL (ref 0.0–100.0)

## 2010-08-26 LAB — AMYLASE: Amylase: 45 U/L (ref 0–105)

## 2010-08-26 LAB — LIPASE, BLOOD: Lipase: 19 U/L (ref 11–59)

## 2010-09-01 LAB — URINALYSIS, ROUTINE W REFLEX MICROSCOPIC
Bilirubin Urine: NEGATIVE
Glucose, UA: NEGATIVE mg/dL
Ketones, ur: NEGATIVE mg/dL
Nitrite: NEGATIVE
Protein, ur: 100 mg/dL — AB
Specific Gravity, Urine: 1.025 (ref 1.005–1.030)
Urobilinogen, UA: 0.2 mg/dL (ref 0.0–1.0)
pH: 5.5 (ref 5.0–8.0)

## 2010-09-01 LAB — URINE MICROSCOPIC-ADD ON

## 2010-09-01 LAB — URINE CULTURE: Colony Count: 55000

## 2010-09-04 ENCOUNTER — Telehealth: Payer: Self-pay | Admitting: Family Medicine

## 2010-09-04 NOTE — Telephone Encounter (Signed)
She has known disc disease in her spine, bulging disc, I suggest nurse visit only for toradol 60mg  Im and depomedrol 80mg  im, and also send in ibuprofen 800mg  one tree times daily for 7 days, and prednisone 5mg  dose pack, let me know if this is a problem pls

## 2010-09-04 NOTE — Telephone Encounter (Signed)
Lower back pain,states feels like legs are going to give way when she stands, states pain in left hip down leg. X 2 weeks What do you suggest?

## 2010-09-05 ENCOUNTER — Encounter: Payer: Self-pay | Admitting: Family Medicine

## 2010-09-05 ENCOUNTER — Ambulatory Visit (INDEPENDENT_AMBULATORY_CARE_PROVIDER_SITE_OTHER): Payer: Medicare Other | Admitting: Family Medicine

## 2010-09-05 VITALS — BP 130/80 | Wt 205.0 lb

## 2010-09-05 DIAGNOSIS — M549 Dorsalgia, unspecified: Secondary | ICD-10-CM

## 2010-09-05 MED ORDER — METHYLPREDNISOLONE ACETATE 80 MG/ML IJ SUSP
80.0000 mg | Freq: Once | INTRAMUSCULAR | Status: AC
  Administered 2010-09-05: 80 mg via INTRAMUSCULAR

## 2010-09-05 MED ORDER — KETOROLAC TROMETHAMINE 30 MG/ML IJ SOLN
60.0000 mg | Freq: Once | INTRAMUSCULAR | Status: AC
  Administered 2010-09-05: 60 mg via INTRAVENOUS

## 2010-09-05 MED ORDER — IBUPROFEN 800 MG PO TABS: 800.0000 mg | ORAL_TABLET | Freq: Three times a day (TID) | ORAL | Status: AC | PRN

## 2010-09-05 NOTE — Telephone Encounter (Signed)
Patient coming today for nurse visit injections, ibuprofen sent but patient declined prednisone dose pak.

## 2010-09-11 NOTE — Progress Notes (Signed)
T60 and D80 given per Dr Moshe Cipro for pain

## 2010-09-16 NOTE — Progress Notes (Signed)
  Subjective:    Patient ID: Cheryl Abbott, female    DOB: September 11, 1945, 65 y.o.   MRN: [id number]  HPI    Review of Systems     Objective:   Physical Exam        Assessment & Plan:  Acute on chronic back pain, toradol 60mg  im and depomedrol 8mg IiM  Ordered and given with no adverse effects.

## 2010-09-22 ENCOUNTER — Other Ambulatory Visit: Payer: Self-pay | Admitting: Family Medicine

## 2010-09-22 DIAGNOSIS — E119 Type 2 diabetes mellitus without complications: Secondary | ICD-10-CM

## 2010-09-27 ENCOUNTER — Other Ambulatory Visit: Payer: Self-pay | Admitting: Family Medicine

## 2010-10-04 ENCOUNTER — Encounter: Payer: Self-pay | Admitting: Family Medicine

## 2010-10-09 ENCOUNTER — Encounter: Payer: Self-pay | Admitting: Family Medicine

## 2010-10-10 ENCOUNTER — Ambulatory Visit (INDEPENDENT_AMBULATORY_CARE_PROVIDER_SITE_OTHER): Payer: Medicare Other | Admitting: Family Medicine

## 2010-10-10 ENCOUNTER — Encounter: Payer: Self-pay | Admitting: Family Medicine

## 2010-10-10 VITALS — BP 112/78 | HR 97 | Resp 16 | Ht 67.0 in | Wt 203.0 lb

## 2010-10-10 DIAGNOSIS — I10 Essential (primary) hypertension: Secondary | ICD-10-CM

## 2010-10-10 DIAGNOSIS — E785 Hyperlipidemia, unspecified: Secondary | ICD-10-CM

## 2010-10-10 DIAGNOSIS — E119 Type 2 diabetes mellitus without complications: Secondary | ICD-10-CM

## 2010-10-10 DIAGNOSIS — M549 Dorsalgia, unspecified: Secondary | ICD-10-CM

## 2010-10-10 DIAGNOSIS — F411 Generalized anxiety disorder: Secondary | ICD-10-CM

## 2010-10-10 MED ORDER — GABAPENTIN 300 MG PO CAPS: 300.0000 mg | ORAL_CAPSULE | Freq: Every day | ORAL | Status: DC

## 2010-10-10 NOTE — Progress Notes (Signed)
  Subjective:    Patient ID: Cheryl Abbott, female    DOB: 02/16/1946, 65 y.o.   MRN: [id number]  HPI C/o increased and uncontrolled back pain for the past several months. States injections from here helped short term only.Activity level is zero. She has been to pain specialists and neurosurgeon in the past , wants to see neither, at this time, is interested in physical therapy No specific aggravating or relieving factors noted, no lower ext weakness or numbness, no incontinence. HYPERTENSION Disease Monitoring Blood pressure range120 to 130 /80Chest pain- no      Dyspnea- no Medications Compliance- good Lightheadedness- no   Edema- no   DIABETES Disease Monitoring Blood Sugar ranges-90 to 10 fatsing Polyuria- no New Visual problems- no Medications Compliance- good  Hypoglycemic symptoms- no   HYPERLIPIDEMIA Disease Monitoring See symptoms for Hypertension Medications Compliance- good  RUQ pain- no  Muscle aches- no        Review of Systems Denies recent fever or chills.c/o chronic fatigue and lack of energy . Does not get around much , some of the reason is uncontrolled back pain Denies sinus pressure, nasal congestion, ear pain or sore throat. Denies chest congestion, productive cough or wheezing. Denies chest pains, palpitations, paroxysmal nocturnal dyspnea, orthopnea and leg swelling Denies abdominal pain, nausea, vomiting,diarrhea or constipation.  Denies rectal bleeding or change in bowel movement. Denies dysuria, frequency, hesitancy or incontinence.  Denies headaches, seizure, numbness, or tingling. Denies uncontrolled depression, anxiety or insomnia.she takes medication which keeps her stable, she is not suicidal, homicidal or excessively tearful. Denies skin break down or rash.        Objective:   Physical Exam Patient alert and oriented and in no Cardiopulmonary distress.  HEENT: No facial asymmetry, EOMI, no sinus tenderness, TM's clear, Oropharynx  pink and moist.  Neck supple no adenopathy.  Chest: Clear to auscultation bilaterally.  CVS: S1, S2 no murmurs, no S3.  ABD: Soft non tender. Bowel sounds normal.  Ext: No edema  MS: decreased  ROM spine,adequate in shoulders, hips and knees.  Skin: Intact, no ulcerations or rash noted.  Psych: Good eye contact, normal affect. Memory intact not anxious or depressed appearing.  CNS: CN 2-12 intact, power, tone and sensation normal throughout.        Assessment & Plan:

## 2010-10-10 NOTE — Patient Instructions (Signed)
F/u end June.  New medication for back pain and you will be referred to physical therapy.  Fasting labs end June and urine to be sent today for testing.

## 2010-10-12 LAB — MICROALBUMIN / CREATININE URINE RATIO
Creatinine, Urine: 131.7 mg/dL
Microalb Creat Ratio: 26 mg/g (ref 0.0–30.0)
Microalb, Ur: 3.43 mg/dL — ABNORMAL HIGH (ref 0.00–1.89)

## 2010-10-15 NOTE — Assessment & Plan Note (Signed)
Controlled, no change in medication  

## 2010-10-15 NOTE — Assessment & Plan Note (Signed)
Not at goal, low fat diet discussed, no med change

## 2010-10-15 NOTE — Assessment & Plan Note (Signed)
Deteriorated, refer to PT

## 2010-10-18 ENCOUNTER — Telehealth: Payer: Self-pay | Admitting: Family Medicine

## 2010-10-18 ENCOUNTER — Other Ambulatory Visit: Payer: Self-pay

## 2010-10-18 MED ORDER — POTASSIUM CITRATE ER 5 MEQ (540 MG) PO TBCR: 5.0000 meq | EXTENDED_RELEASE_TABLET | Freq: Two times a day (BID) | ORAL | Status: DC

## 2010-10-18 NOTE — Telephone Encounter (Signed)
The doesn't like the potassium her Dr. [place] prescribed to her. She wants to go back on the ones she got from you last time which were potassium 26meq ER bid. [facility]

## 2010-10-18 NOTE — Telephone Encounter (Signed)
Med sent in.

## 2010-10-18 NOTE — Telephone Encounter (Signed)
pls send inn 1 month supply with 3 refills of the potassium I used to prescribe her, and let her know

## 2010-10-20 ENCOUNTER — Ambulatory Visit (HOSPITAL_COMMUNITY)
Admission: RE | Admit: 2010-10-20 | Discharge: 2010-10-20 | Disposition: A | Discharge status: Home / Self Care | Payer: Medicare Other | Source: Ambulatory Visit | Attending: Family Medicine | Admitting: Family Medicine

## 2010-10-20 DIAGNOSIS — M545 Low back pain, unspecified: Secondary | ICD-10-CM | POA: Insufficient documentation

## 2010-10-20 DIAGNOSIS — IMO0001 Reserved for inherently not codable concepts without codable children: Secondary | ICD-10-CM | POA: Insufficient documentation

## 2010-10-20 DIAGNOSIS — E119 Type 2 diabetes mellitus without complications: Secondary | ICD-10-CM | POA: Insufficient documentation

## 2010-10-20 DIAGNOSIS — I10 Essential (primary) hypertension: Secondary | ICD-10-CM | POA: Insufficient documentation

## 2010-10-20 DIAGNOSIS — R269 Unspecified abnormalities of gait and mobility: Secondary | ICD-10-CM | POA: Insufficient documentation

## 2010-10-20 DIAGNOSIS — M6281 Muscle weakness (generalized): Secondary | ICD-10-CM | POA: Insufficient documentation

## 2010-10-23 ENCOUNTER — Other Ambulatory Visit: Payer: Self-pay | Admitting: Family Medicine

## 2010-10-23 ENCOUNTER — Ambulatory Visit (HOSPITAL_COMMUNITY): Payer: Medicare Other

## 2010-10-24 NOTE — Assessment & Plan Note (Signed)
NAME:  AJWA, FRATTINI              CHART#:  [id number]   DATE:  01/24/2007                       DOB:  [birth year]   REFERRING PHYSICIAN:  Tula Nakayama.   PROBLEM LIST:  1. Adenomatous polyp on biopsy in March 2008.  2. Fatty liver disease causing mild hepatomegaly without biochemical      evidence of hepatocellular injury.  3. Diabetes.  4. Severe obesity.  5. Anxiety.  6. Kidney stones.  7. Hemorrhoids.  8. Hysterectomy.   SUBJECTIVE:  The patient is a 65 year old female who presents today  complaining of having problems with her kidney stones.  She also was  having problems with shortness of breath when she lays down, and feeling  like acid was backing up in her throat.  She also complains of loose  stool after eating.  She has soreness in her chest wall muscles.  She  sometimes also has a sharp, internal pain.  Since her visit in February  of 2008, she has lost only 4 pounds.   ALLERGIES:  PENICILLIN.   MEDICATIONS:  1. Alprazolam 0.5 mg b.i.d.  2. Paroxetine 20 mg daily.  3. Lovastatin 20 mg nightly.  4. Aspirin 81 mg daily.  5. Darvocet 100/650 one nightly.  6. Potassium citrate.  7. Metformin 1 g b.i.d.  8. Amlodipine 10 mg daily.  9. Bactrim double strength 1 daily.  10.Tylenol PM nightly as needed.   OBJECTIVE:  Weight 222 pounds, height 5 feet 6 inches, BMI 35.8  (severely obese).  Temperature 98.1, blood pressure 120/82, pulse is 94.  GENERAL:  She is in no acute distress, alert and oriented x4.  HEENT:  Atraumatic, normocephalic.  Pupils equal and reactive to light.  Mouth, no oral lesions.  Posterior pharynx without erythema or  exudate.NECK:  Full range of motion, no lymphadenopathy.LUNGS:  Clear to  auscultation bilaterally. CARDIOVASCULAR EXAM:  Regular rhythm, no  murmur, normal S1, S2.ABDOMEN:  Bowel sounds are present.  Soft,  nontender, nondistended, no rebound or guarding.NEURO:  She has no focal  neurological deficit.   ASSESSMENT:  1. The  patient is a 65 year old female with fatty liver disease.  I,      again, emphasized to her that managing her fatty liver disease will      take effort by her.  2. It sounds like she has a component of gastroesophageal reflux      disease which responded to Prevacid, but now she is out of those      medicines.  3. She also has loose stool after eating which may be secondary to      irritable bowel syndrome or diabetic enteropathy.   Thank you for allowing me to see the patient in consultation.  My  recommendations follow.   RECOMMENDATIONS:  1. I emphasized to her that her fatty liver disease can be reversed by      controlling her cholesterol, good diabetic control, and weight      loss.  I will check a hepatic function panel today.  2. She is given a prescription for omeprazole 20 mg daily.  3. She has a return patient visit to see me in 6 months.  If the loose      stools persist, then we would consider adding a medication to      prevent post-prandial diarrhea.  I could also consider doing a      breath test to look for bacterial overgrowth.       Caro Hight, M.D.  Electronically Signed     SM/MEDQ  D:  01/24/2007  T:  01/25/2007  Job:  [id number]   cc:   Norwood Levo. Moshe Cipro, M.D.

## 2010-10-24 NOTE — Assessment & Plan Note (Signed)
NAME:  Cheryl Abbott, Cheryl Abbott              CHART#:  [id number]   DATE:  08/05/2007                       DOB:  Apr 24, 1946   REFERRING PHYSICIAN:  Tula Nakayama, MD.   PROBLEM LIST:  1. Adenomatous polyp in March 2008.  2. Fatty liver disease with severe obesity.  3. Diabetes.  4. Anxiety.  5. Kidney stones.  6. Hemorrhoids.  7. Hysterectomy.   SUBJECTIVE:  [name] is a 65 year old female who presents as a  return patient visit.  She was last seen in August 2008.  She has  intentionally lost 11 pounds.  She has pain in her flanks, in her chest,  and sometimes in her back.  She is not really sure whether it is gas,  reflux, or kidney stones.  She did have surgery on her kidney stones  approximately 2 years ago.  The pain can last a few seconds, but it  takes her breath.  She usually treats it with Pepsi.  Once she has a  good burp, she feels better.   MEDICATIONS:  1. Alprazolam.  2. Paroxetine.  3. Lovastatin.  4. Aspirin 81 mg daily.  5. Darvocet.  6. Potassium citrate.  7. Metformin 1 gm b.i.d.  8. Amlodipine.  9. Tylenol.   OBJECTIVE:  Weight 211 pounds.  Height 5 feet 6 inches.  Temperature  97.7.  Blood pressure 120/90.  Pulse 64.  GENERAL:  She is in no apparent distress.  Alert and oriented x4.  LUNGS:  Clear to auscultation bilaterally.  CARDIOVASCULAR EXAM:  Regular rhythm.  No murmur.ABDOMEN:  Bowel sounds are present.  Soft,  nontender and non-distended.   ASSESSMENT:  [name] is a 65 year old female who has migratory pain,  which may be secondary to gastroesophageal reflux disease.  She has been  successful at losing weight.  Thank you for allowing me to see Ms.  Cheryl Abbott in consultation.  My recommendations follow.   RECOMMENDATIONS:  1. She should continue her omeprazole once daily, and she was given a      prescription.  2. She should follow the lifestyle modification recommendations from      the [facility] handout.  3. We will obtain labs from  [facility]'s office.  4. She has a follow up appointment to see me in 3 months.       Caro Hight, M.D.  Electronically Signed     SM/MEDQ  D:  08/05/2007  T:  08/06/2007  Job:  [id number]   cc:   Norwood Levo. Moshe Cipro, M.D.

## 2010-10-27 ENCOUNTER — Ambulatory Visit (HOSPITAL_COMMUNITY)
Admission: RE | Admit: 2010-10-27 | Discharge: 2010-10-27 | Disposition: A | Discharge status: Home / Self Care | Payer: Medicare Other | Source: Ambulatory Visit | Attending: Family Medicine | Admitting: Family Medicine

## 2010-10-27 NOTE — Op Note (Signed)
Cheryl Abbott, Cheryl Abbott             ACCOUNT NO.:  [id number]   MEDICAL RECORD NO.:  [id number]          PATIENT TYPE:  AMB   LOCATION:  DAY                           FACILITY:  [facility]   PHYSICIAN:  Caro Hight, M.D.      DATE OF BIRTH:  02/04/1946   DATE OF PROCEDURE:  08/21/2006  DATE OF DISCHARGE:                               OPERATIVE REPORT   REFERRING PHYSICIAN:  Tula Nakayama, M.D.   PROCEDURE:  Colonoscopy with cold snare polypectomy.   INDICATION FOR EXAM:  Ms. Apollo is a 65 year old female with a  personal history of polyps.  She presents for colon cancer screening.   FINDINGS:  1. Multiple ascending colon polyps removed via cold snare.  2. Sigmoid colon polyp removed via cold snare.  3. Sigmoid diverticulosis. Otherwise no masses, inflammatory changes      or AVMs.  4. Normal retroflexed view of the rectum.   RECOMMENDATIONS:  1. Screening colonoscopy in 5 years.  2. High fiber diet.  Ms. Mauger is given a handout on high-fiber diet      and polyps.  3. No aspirin, NSAIDs or anticoagulation for 7 days.  4. Will call Ms. Harden with her biopsy results.   MEDICATIONS:  1. Demerol 100 mg IV.  2. Versed 10 mg IV.  3. Phenergan 25 mg IV.   PROCEDURE TECHNIQUE:  Physical exam was performed.  Informed consent was  obtained from the patient after explaining the benefits, risks and  alternatives to the procedure.  The patient was connected to the monitor  placed in left lateral position.  Continuous oxygen was provided by  nasal cannula IV medicine administered through an indwelling cannula.  After administration of sedation and rectal exam, the patient's rectum  was intubated.  Scope  was advanced under direct visualization to the cecum.  The scope was  subsequently slowly by carefully examining the color, texture, anatomy  and integrity mucosa on the way out.  The patient was recovered  endoscopy suite and discharged home in satisfactory condition.      Caro Hight, M.D.  Electronically Signed     SM/MEDQ  D:  08/27/2006  T:  08/28/2006  Job:  [id number]   cc:   Norwood Levo. Moshe Cipro, M.D.  Fax: [phone]

## 2010-10-27 NOTE — H&P (Signed)
[facility]  Patient:    Cheryl Abbott, Cheryl Abbott Visit Number: [id number] MRN: [id number]          Service Type: OUT Location: RAD Attending Physician:  Tula Nakayama Dictated by:   Irving Shows, M.D. Admit Date:  04/03/2001 Discharge Date: 04/03/2001                           History and Physical  HISTORY OF PRESENT ILLNESS:  This is a 65 year old female with a history of chronic abdominal pain.  She has pains in her upper abdomen that starts on the left and goes across her abdomen.  She has morning nausea with meals.  There is no vomiting.  She has had some diarrhea.  She denies indigestion of heartburn.  She is H. pylori negative and gallbladder ultrasound is negative. The patient is scheduled to have upper endoscopy and HIDA scan.  PAST MEDICAL HISTORY:  She has hypertension, anxiety disorder, osteoarthritis, and obesity.  MEDICATIONS:  Celebrex 200 mg q.d., Paxil 20 mg q.d., Serazone 100 mg q.d., Norvasc 5 mg q.d., Tylenol No 3 q.4h. p.r.n. pain.  ALLERGIES:  PENICILLIN.  PAST SURGICAL HISTORY:  The patient had colonoscopy in July which showed transverse colon and sigmoid colon polyps.  H. pylori has been negative.  The polyps showed focal adenomatous changes.  PHYSICAL EXAMINATION:  VITAL SIGNS:  Blood pressure 130/80, pulse 80, respirations 18.  Weight 231 pounds.  HEENT:  Unremarkable.  NECK:  Supple without JVD or bruits.  CHEST:  Clear to auscultation.  HEART:  Regular rate without murmur, gallop, or rub.  ABDOMEN:  Soft and nontender.  No masses.  Normoactive bowel sounds.  EXTREMITIES:  No clubbing, cyanosis, or edema.  No joint deformity.  NEUROLOGIC:  No motor, sensory or cerebellar deficit.  Cranial nerves intact.  IMPRESSION: 1. Recurrent abdominal pain with progressive dyspepsia. 2. Hypertension. 3. Anxiety disorder. 4. Obesity. 5. Osteoarthritis.  PLAN:  Upper endoscopy. Dictated by:   Irving Shows,  M.D. Attending Physician:  Tula Nakayama DD:  04/15/01 TD:  04/15/01 Job: [id number]

## 2010-10-27 NOTE — H&P (Signed)
NAMEMCKAILA, RENNHACK             ACCOUNT NO.:  [id number]   MEDICAL RECORD NO.:  [id number]          PATIENT TYPE:  AMB   LOCATION:  DAY                           FACILITY:  [facility]   PHYSICIAN:  Leane Para C. Tamala Julian, M.D.   DATE OF BIRTH:  Mar 27, 1946   DATE OF ADMISSION:  DATE OF DISCHARGE:  LH                                HISTORY & PHYSICAL   HISTORY OF PRESENT ILLNESS:  A 65 year old female with a prior history of  colon polyps with last colonoscopy over four years ago.  She has had no  further problems.  She is having followup colonoscopy.   PAST MEDICAL HISTORY:  1.  Hypertension.  2.  Hyperlipidemia.  3.  Diabetes mellitus.  4.  Chronic low back pain.  5.  Depression.  6.  History of kidney stones.  7.  Venous stasis disease.   MEDICATIONS:  Norvasc, Lipitor, Actos, Skelaxin, and Naprosyn.  The patient  did not remember the dose.   PAST SURGICAL HISTORY:  Positive for hysterectomy.   PHYSICAL EXAMINATION:  VITAL SIGNS:  Blood pressure 120/70, pulse 80,  respirations 18, weight 240 pounds.  HEENT:  Unremarkable.  NECK:  Supple without JVD, bruits, adenopathy, or thyromegaly.  CHEST:  Clear to auscultation.  HEART:  Regular rate and rhythm without murmur, gallop or rub.  ABDOMEN:  Soft, nontender.  No masses.  RECTAL:  External hemorrhoids with sentinel pile.  Stool guaiac negative.  EXTREMITIES:  No cyanosis, clubbing or edema.  NEUROLOGIC:  No focal motor, sensory or cerebellar deficits.   IMPRESSION:  1.  History of colon polyps.  2.  External hemorrhoids with a large sentinel pile.  3.  Hypertension.  4.  Diabetes mellitus.  5.  Chronic venous stasis.  6.  Anxiety disorder.   PLAN:  A colonoscopy with polypectomy if needed.  If colonoscopy is  negative, the patient will have hemorrhoidectomy with excision of her  external perianal tags.      LCS/MEDQ  D:  08/10/2004  T:  08/10/2004  Job:  [id number]

## 2010-10-27 NOTE — Procedures (Signed)
NAMEYOSHINO, Cheryl Abbott             ACCOUNT NO.:  [id number]   MEDICAL RECORD NO.:  [id number]          PATIENT TYPE:  OUT   LOCATION:  RAD                           FACILITY:  [facility]   PHYSICIAN:  Leslye Peer, MD       DATE OF BIRTH:  Jul 22, 1945   DATE OF PROCEDURE:  09/02/2006  DATE OF DISCHARGE:                                ECHOCARDIOGRAM   INDICATIONS:  A 65 year old female with past medical history of  hypertension referred for increasing shortness of breath.   The technical quality of the study is reasonable.   The aorta measures normally at 2.7 cm.   The left atrium is mildly dilated and measured at 4.2 cm.  The patient  appeared to be in sinus rhythm during this procedure.   The interventricular septum and posterior wall are mildly thickened.   The aortic valve is not well visualized but appeared to be grossly  structurally normal.  Doppler interrogation of the aortic valve revealed  a peak velocity of 1.6 meters per second corresponding to a peak  gradient of 11 mmHg and a mean gradient of 7 mmHg.  No significant  aortic insufficiency is noted.  Doppler interrogation of the aortic  valve is as outlined above.   The mitral valve appeared grossly structurally normal.  No mitral valve  prolapse is noted.  Trivial mitral regurgitation is noted.  In the  parasternal long axis view there did appear to be a small mobile  echodensity just below the mitral valve most suggestive of a lax chordae  or a torn minor subvalvular apparatus.  Cannot entirely exclude  vegetation but doubt given location and character.   Pulmonic valve is incompletely visualized but appeared to be grossly  structurally normal.   The tricuspid valve also appeared grossly structurally normal with  trivial tricuspid regurgitation.   The left ventricle is normal in size with the LVIDD measured at 3.6 cm  and LVISD measured at 2.7 cm.  Overall left ventricular systolic  function is normal and no regional  wall motion abnormalities are  appreciated.   The right ventricle is mild to moderately dilated with preserved right  ventricular systolic function.   IMPRESSION:  1. Mild left atrial enlargement.  2. Mild concentric left ventricular hypertrophy.  3. Mild aortic sclerosis without hemodynamically significant stenosis.  4. Trivial mitral and tricuspid regurgitation.  5. Normal left ventricular size and systolic function without regional      wall motion abnormality noted.  6. Mild to moderate right ventricular enlargement with preserved right      ventricular systolic function.           ______________________________  Leslye Peer, MD     AB/MEDQ  D:  09/02/2006  T:  09/02/2006  Job:  [id number]   cc:   Norwood Levo. Moshe Cipro, M.D.  Fax: [phone]   Dr. Claiborne Billings

## 2010-10-27 NOTE — H&P (Signed)
[facility]  Patient:    Cheryl Abbott, Cheryl Abbott Visit Number: [id number] MRN: [id number]          Service Type: END Location: DAY Attending Physician:  Delorise Jackson Dictated by:   Irving Shows, M.D. Admit Date:  04/16/2001 Discharge Date: 04/16/2001                           History and Physical  HISTORY OF PRESENT ILLNESS:  A polite 65 year old female with a history of sharp pains of the upper abdomen that starts from her left upper quadrant and goes across her abdomen.  She has morning nausea especially with meals, some increased diarrhea.  No indigestion or heartburn.  H. pylori is negative. Gallbladder ultrasound is negative.  Patient is scheduled for upper endoscopy.  PAST MEDICAL HISTORY:  She has hypertension, degenerative arthritis, depression.  PAST SURGICAL HISTORY:  Hysterectomy.  MEDICATIONS: 1. Norvasc 5 mg q.d. 2. Serzone 100 mg q.d. 3. Paxil 20 mg q.d. 4. Tylenol PM two q.h.s. p.r.n.  PHYSICAL EXAMINATION:  VITAL SIGNS:  Blood pressure 130/80, pulse 80, respirations 18, weight 231 pounds.  HEENT:  Unremarkable.  NECK:  Supple without JVD or bruit.  CHEST:  Clear to auscultation.  HEART:  Regular rate and rhythm without murmur, gallop, or rub.  ABDOMEN:  Soft, nontender, no masses.  EXTREMITIES:  Unremarkable.  No cyanosis, clubbing, or edema.  NEUROLOGIC:  No focal motor, sensory, or cerebellar deficit.  IMPRESSION: 1. Recurrent upper abdominal pain. 2. Hypertension. 3. Depression with anxiety.  PLAN:  Upper endoscopy. Dictated by:   Irving Shows, M.D. Attending Physician:  Delorise Jackson DD:  04/29/01 TD:  04/29/01 Job: [id number]

## 2010-10-27 NOTE — Op Note (Signed)
[name], [name]             ACCOUNT NO.:  [id number]   MEDICAL RECORD NO.:  [id number]          PATIENT TYPE:  AMB   LOCATION:  DAY                           FACILITY:  [facility]   PHYSICIAN:  Marissa Nestle, M.D.DATE OF BIRTH:  February 21, 1946   DATE OF PROCEDURE:  10/13/2004  DATE OF DISCHARGE:                                 OPERATIVE REPORT   PREOPERATIVE DIAGNOSIS:  Right ureteral calculi, right pyelonephritis.   PROCEDURE:  Cystoscopy, right retrograde pyelogram, Holmium laser  lithotripsy and stone basket, insertion of double J stent, size 6 French 24-  cm, no string attached.   ANESTHESIA:  General endotracheal.   PROCEDURE NOTE:  The patient was given general endotracheal, placed in  lithotomy position. After usual prep and drape, a #25 cystoscope introduced  into the bladder. It looks normal. Right ureteral orifice is catheterized  with a wedge catheter, and 1 cc of Renografin is injected under very low  pressure because this patient is running fever, has pyelonephritis, so I  want to do just the retrograde of the lower third of the ureter. So gently  injected the dye, and the dye goes up into the ureter. Then I can see the  filling defects which are stones. At this point, a guide wire is passed up  into the renal pelvis, and the cystoscope is removed. Along side the guide  wire, the short rigid ureteroscope was introduced, went to the level of the  stone. Stone was treated with Holmium laser, and then I started to extract  the stone with basket without any problem. I went to the level of the mid  ureter. All of the stones were extracted from this area, and the ureter  above that is markedly dilated, so I decided to quit at this time, and the  ureteroscope was removed over the guide wire. Six-French 24-cm double J  stent was positioned between the renal pelvis and the bladder. Nice loops  were obtained. Guide wire was removed. All of the instruments were removed.  The  patient left the operating room in satisfactory condition.      MIJ/MEDQ  D:  10/13/2004  T:  10/13/2004  Job:  [id number]

## 2010-10-27 NOTE — H&P (Signed)
Cheryl Abbott, Cheryl Abbott             ACCOUNT NO.:  [id number]   MEDICAL RECORD NO.:  [id number]          PATIENT TYPE:  AMB   LOCATION:  DAY                           FACILITY:  [facility]   PHYSICIAN:  Marissa Nestle, M.D.DATE OF BIRTH:  [birth year]   DATE OF ADMISSION:  DATE OF DISCHARGE:  LH                                HISTORY & PHYSICAL   CHIEF COMPLAINT:  Right flank pain.   HISTORY:  A 66 year old female who is complaining of pain in the right flank  and nausea.  She was quite sick last week with no vomiting.  No fevers or  chills.  She was having pain in the right flank.  She went to [facility]  Emergency Room.  She was told she had stones in her right kidney and left  kidney.  Also in the right ureter.  She was sent home with antibiotics.  She  is still having considerable pain.  She has not passed any stones.  It  should be mentioned that a couple of weeks ago she had a stone in the right  kidney and underwent lithotripsy.  Today on a KUB I can see the stone it  well broken and several pieces of stone are in the distal right ureter.  I  have recommended that we go ahead and do a stone basket and put in a double  J stent.  The procedure, its limitations, complications, and the possibility  of open surgery were discussed.  She understands and wants me to go ahead  and proceed.   PAST MEDICAL HISTORY:  1.  She had several years ago lithotripsy on the left side for a renal      calculous.  2.  She had a hysterectomy several years ago for bleeding.  3.  She has non-insulin-dependent diabetes and hypertension.   PERSONAL HISTORY:  She does not smoke or drink.   REVIEW OF SYSTEMS:  Unremarkable.   MEDICATIONS:  She is taking:  1.  Dilaudid.  2.  Cipro.   PHYSICAL EXAMINATION:  VITAL SIGNS:  Blood pressure is 128/77, temperature  is 97.9.  CENTRAL NERVOUS SYSTEM:  No gross neurological deficit.  HEAD/NECK:  Negative.  CHEST:  Symmetrical.  HEART:  Regular sinus rhythm.  No  murmur.  ABDOMEN:  Soft, flat.  Liver, spleen, and kidneys are not palpable.  There  is 1+ right CVA tenderness.   IMPRESSION:  1.  Bilateral renal calculi.  2.  Right ureteral calculi.  3.  Possible right pyelonephritis.   PLAN:  Cystoscopy and right retrograde pyelogram.  Ureteroscopic stone  basket, Holmium laser lithotripsy and the use of a double J stent.  The  patient is coming tomorrow and will be kept overnight for observation.      MIJ/MEDQ  D:  10/12/2004  T:  10/12/2004  Job:  [id number]

## 2010-11-02 ENCOUNTER — Ambulatory Visit (HOSPITAL_COMMUNITY)
Admission: RE | Admit: 2010-11-02 | Discharge: 2010-11-02 | Disposition: A | Discharge status: Home / Self Care | Payer: Medicare Other | Source: Ambulatory Visit | Attending: Family Medicine | Admitting: Family Medicine

## 2010-11-08 ENCOUNTER — Ambulatory Visit (HOSPITAL_COMMUNITY)
Admission: RE | Admit: 2010-11-08 | Discharge: 2010-11-08 | Disposition: A | Discharge status: Home / Self Care | Payer: Medicare Other | Source: Ambulatory Visit | Attending: Family Medicine | Admitting: Family Medicine

## 2010-11-09 ENCOUNTER — Ambulatory Visit (HOSPITAL_COMMUNITY)
Admission: RE | Admit: 2010-11-09 | Discharge: 2010-11-09 | Disposition: A | Discharge status: Home / Self Care | Payer: Medicare Other | Source: Ambulatory Visit | Attending: Family Medicine | Admitting: Family Medicine

## 2010-11-14 ENCOUNTER — Ambulatory Visit (HOSPITAL_COMMUNITY)
Admission: RE | Admit: 2010-11-14 | Discharge: 2010-11-14 | Disposition: A | Discharge status: Home / Self Care | Payer: Medicare Other | Source: Ambulatory Visit | Attending: Family Medicine | Admitting: Family Medicine

## 2010-11-14 ENCOUNTER — Encounter: Payer: Self-pay | Admitting: Family Medicine

## 2010-11-14 ENCOUNTER — Other Ambulatory Visit: Payer: Self-pay | Admitting: Family Medicine

## 2010-11-14 DIAGNOSIS — IMO0001 Reserved for inherently not codable concepts without codable children: Secondary | ICD-10-CM | POA: Insufficient documentation

## 2010-11-14 DIAGNOSIS — M6281 Muscle weakness (generalized): Secondary | ICD-10-CM | POA: Insufficient documentation

## 2010-11-14 DIAGNOSIS — M545 Low back pain, unspecified: Secondary | ICD-10-CM | POA: Insufficient documentation

## 2010-11-14 DIAGNOSIS — E119 Type 2 diabetes mellitus without complications: Secondary | ICD-10-CM | POA: Insufficient documentation

## 2010-11-14 DIAGNOSIS — R269 Unspecified abnormalities of gait and mobility: Secondary | ICD-10-CM | POA: Insufficient documentation

## 2010-11-14 DIAGNOSIS — I10 Essential (primary) hypertension: Secondary | ICD-10-CM | POA: Insufficient documentation

## 2010-11-15 ENCOUNTER — Encounter: Payer: Self-pay | Admitting: Family Medicine

## 2010-11-15 ENCOUNTER — Ambulatory Visit (INDEPENDENT_AMBULATORY_CARE_PROVIDER_SITE_OTHER): Payer: Medicare Other | Admitting: Family Medicine

## 2010-11-15 VITALS — BP 120/84 | HR 104 | Resp 16 | Ht 66.5 in | Wt 203.4 lb

## 2010-11-15 DIAGNOSIS — E119 Type 2 diabetes mellitus without complications: Secondary | ICD-10-CM

## 2010-11-15 DIAGNOSIS — I10 Essential (primary) hypertension: Secondary | ICD-10-CM

## 2010-11-15 DIAGNOSIS — B369 Superficial mycosis, unspecified: Secondary | ICD-10-CM

## 2010-11-15 DIAGNOSIS — E669 Obesity, unspecified: Secondary | ICD-10-CM

## 2010-11-15 DIAGNOSIS — E785 Hyperlipidemia, unspecified: Secondary | ICD-10-CM

## 2010-11-15 LAB — HEMOGLOBIN A1C
Hgb A1c MFr Bld: 6.8 % — ABNORMAL HIGH (ref <5.7)
Mean Plasma Glucose: 148 mg/dL — ABNORMAL HIGH (ref <117)

## 2010-11-15 MED ORDER — OMEPRAZOLE 20 MG PO CPDR: 20.0000 mg | DELAYED_RELEASE_CAPSULE | Freq: Every day | ORAL | Status: DC

## 2010-11-15 MED ORDER — AMLODIPINE BESYLATE 10 MG PO TABS: 10.0000 mg | ORAL_TABLET | Freq: Every day | ORAL | Status: DC

## 2010-11-15 MED ORDER — BENAZEPRIL HCL 5 MG PO TABS: 5.0000 mg | ORAL_TABLET | Freq: Every day | ORAL | Status: DC

## 2010-11-15 MED ORDER — ALPRAZOLAM 0.5 MG PO TABS: 0.5000 mg | ORAL_TABLET | Freq: Two times a day (BID) | ORAL | Status: DC

## 2010-11-15 MED ORDER — TRAMADOL HCL 50 MG PO TABS: 50.0000 mg | ORAL_TABLET | Freq: Two times a day (BID) | ORAL | Status: DC

## 2010-11-15 NOTE — Progress Notes (Signed)
  Subjective:    Patient ID: Cheryl Abbott, female    DOB: [birth year], 65 y.o.   MRN: [id number]  HPI Pt reports improvement for her back pain with physical therapy, still continuing with this HYPERTENSION Disease Monitoring Blood pressure range-unknown Chest pain- no      Dyspnea- no Medications Compliance- goodLightheadedness- no   Edema- no   DIABETES Disease Monitoring Blood Sugar ranges-fasting range from 133 to 123 Polyuria- no New Visual problems- no Medications Compliance- good Hypoglycemic symptoms- no   HYPERLIPIDEMIA Disease Monitoring See symptoms for Hypertension Medications Compliance- good RUQ pain- no  Muscle aches- no  2 week h/o puritic lesions on different parts of her body, limbs,and abdomen, no purulent drainage    Review of Systems Denies recent fever or chills. Denies sinus pressure, nasal congestion, ear pain or sore throat. Denies chest congestion, productive cough or wheezing. Denies chest pains, palpitations, paroxysmal nocturnal dyspnea, orthopnea and leg swelling Denies abdominal pain, nausea, vomiting,diarrhea or constipation.  Denies rectal bleeding or change in bowel movement. Denies dysuria, frequency, hesitancy or incontinence. Denies joint pain, swelling and limitation in mobility. Denies headaches, seizure, numbness, or tingling. Denies uncontrolled  depression, anxiety or insomnia.        Objective:   Physical Exam Patient alert and oriented and in no Cardiopulmonary distress.  HEENT: No facial asymmetry, EOMI, no sinus tenderness, TM's clear, Oropharynx pink and moist.  Neck supple no adenopathy.  Chest: Clear to auscultation bilaterally.  CVS: S1, S2 no murmurs, no S3.  ABD: Soft non tender. Bowel sounds normal.  Ext: No edema  MS: decreased ROM spine,adequate in  shoulders, hips and knees.  Skin: Intact, hypopigmented macular  rash on upper extremities.  Psych: Good eye contact, normal affect. Memory intact ,  mildly  anxious not  depressed appearing.  CNS: CN 2-12 intact, power, tone and sensation normal throughout.        Assessment & Plan:

## 2010-11-15 NOTE — Patient Instructions (Addendum)
F/u in 3 months.  You need to reduce sugar intake, eat more vegetables, drink water , and commit to regular exercise on avg 30 minutes 5 days per week  Your blood sugar is higher than in the past.  Pls stop sodAS    FASTING LABS IN 3 MONTHS  CONTINUE therapy, and ok to call for dermatology referral   New med to be started forprotection of kidneys, benazepril 5 mg one daily

## 2010-11-16 ENCOUNTER — Ambulatory Visit (HOSPITAL_COMMUNITY)
Admission: RE | Admit: 2010-11-16 | Discharge: 2010-11-16 | Disposition: A | Discharge status: Home / Self Care | Payer: Medicare Other | Source: Ambulatory Visit | Attending: Family Medicine | Admitting: Family Medicine

## 2010-11-21 ENCOUNTER — Inpatient Hospital Stay (HOSPITAL_COMMUNITY)
Admission: EM | Admit: 2010-11-21 | Discharge: 2010-11-24 | DRG: 392 | Disposition: A | Discharge status: Home / Self Care | Payer: Medicare Other | Attending: Internal Medicine | Admitting: Internal Medicine

## 2010-11-21 ENCOUNTER — Inpatient Hospital Stay (HOSPITAL_COMMUNITY): Admission: RE | Admit: 2010-11-21 | Payer: Medicare Other | Source: Ambulatory Visit | Admitting: Physical Therapy

## 2010-11-21 ENCOUNTER — Emergency Department (HOSPITAL_COMMUNITY): Payer: Medicare Other

## 2010-11-21 DIAGNOSIS — E861 Hypovolemia: Secondary | ICD-10-CM | POA: Diagnosis present

## 2010-11-21 DIAGNOSIS — A088 Other specified intestinal infections: Principal | ICD-10-CM | POA: Diagnosis present

## 2010-11-21 DIAGNOSIS — I10 Essential (primary) hypertension: Secondary | ICD-10-CM | POA: Diagnosis present

## 2010-11-21 DIAGNOSIS — K219 Gastro-esophageal reflux disease without esophagitis: Secondary | ICD-10-CM | POA: Diagnosis present

## 2010-11-21 DIAGNOSIS — E119 Type 2 diabetes mellitus without complications: Secondary | ICD-10-CM | POA: Diagnosis present

## 2010-11-21 DIAGNOSIS — D509 Iron deficiency anemia, unspecified: Secondary | ICD-10-CM | POA: Diagnosis present

## 2010-11-21 LAB — DIFFERENTIAL
Basophils Absolute: 0 10*3/uL (ref 0.0–0.1)
Basophils Relative: 0 % (ref 0–1)
Eosinophils Absolute: 0.1 10*3/uL (ref 0.0–0.7)
Eosinophils Relative: 2 % (ref 0–5)
Lymphocytes Relative: 29 % (ref 12–46)
Lymphs Abs: 2.3 10*3/uL (ref 0.7–4.0)
Monocytes Absolute: 0.8 10*3/uL (ref 0.1–1.0)
Monocytes Relative: 9 % (ref 3–12)
Neutro Abs: 4.8 10*3/uL (ref 1.7–7.7)
Neutrophils Relative %: 60 % (ref 43–77)

## 2010-11-21 LAB — URINALYSIS, ROUTINE W REFLEX MICROSCOPIC
Bilirubin Urine: NEGATIVE
Glucose, UA: NEGATIVE mg/dL
Hgb urine dipstick: NEGATIVE
Ketones, ur: NEGATIVE mg/dL
Leukocytes, UA: NEGATIVE
Nitrite: NEGATIVE
Protein, ur: NEGATIVE mg/dL
Specific Gravity, Urine: 1.025 (ref 1.005–1.030)
Urobilinogen, UA: 0.2 mg/dL (ref 0.0–1.0)
pH: 5.5 (ref 5.0–8.0)

## 2010-11-21 LAB — CBC
HCT: 34.5 % — ABNORMAL LOW (ref 36.0–46.0)
Hemoglobin: 10.9 g/dL — ABNORMAL LOW (ref 12.0–15.0)
MCH: 23.9 pg — ABNORMAL LOW (ref 26.0–34.0)
MCHC: 31.6 g/dL (ref 30.0–36.0)
MCV: 75.5 fL — ABNORMAL LOW (ref 78.0–100.0)
Platelets: 454 10*3/uL — ABNORMAL HIGH (ref 150–400)
RBC: 4.57 MIL/uL (ref 3.87–5.11)
RDW: 17.9 % — ABNORMAL HIGH (ref 11.5–15.5)
WBC: 8.1 10*3/uL (ref 4.0–10.5)

## 2010-11-21 LAB — GLUCOSE, CAPILLARY
Glucose-Capillary: 80 mg/dL (ref 70–99)
Glucose-Capillary: 93 mg/dL (ref 70–99)

## 2010-11-21 LAB — COMPREHENSIVE METABOLIC PANEL
ALT: 18 U/L (ref 0–35)
AST: 20 U/L (ref 0–37)
Albumin: 4 g/dL (ref 3.5–5.2)
Alkaline Phosphatase: 85 U/L (ref 39–117)
BUN: 21 mg/dL (ref 6–23)
CO2: 30 mEq/L (ref 19–32)
Calcium: 12 mg/dL — ABNORMAL HIGH (ref 8.4–10.5)
Chloride: 94 mEq/L — ABNORMAL LOW (ref 96–112)
Creatinine, Ser: 1 mg/dL (ref 0.4–1.2)
GFR calc Af Amer: >60 mL/min (ref >60)
GFR calc non Af Amer: 55 mL/min — ABNORMAL LOW (ref >60)
Glucose, Bld: 70 mg/dL (ref 70–99)
Potassium: 4.7 mEq/L (ref 3.5–5.1)
Sodium: 137 mEq/L (ref 135–145)
Total Bilirubin: 0.1 mg/dL — ABNORMAL LOW (ref 0.3–1.2)
Total Protein: 8.2 g/dL (ref 6.0–8.3)

## 2010-11-22 LAB — CARDIAC PANEL(CRET KIN+CKTOT+MB+TROPI)
CK, MB: 2 ng/mL (ref 0.3–4.0)
CK, MB: 2 ng/mL (ref 0.3–4.0)
Relative Index: 1.9 (ref 0.0–2.5)
Relative Index: 1.8 (ref 0.0–2.5)
Total CK: 108 U/L (ref 7–177)
Total CK: 110 U/L (ref 7–177)
Troponin I: <0.3 ng/mL (ref <0.30)
Troponin I: <0.3 ng/mL (ref <0.30)

## 2010-11-22 LAB — TSH: TSH: 2.126 u[IU]/mL (ref 0.350–4.500)

## 2010-11-22 LAB — BASIC METABOLIC PANEL
BUN: 15 mg/dL (ref 6–23)
CO2: 30 mEq/L (ref 19–32)
Calcium: 10.2 mg/dL (ref 8.4–10.5)
Chloride: 98 mEq/L (ref 96–112)
Creatinine, Ser: 0.94 mg/dL (ref 0.4–1.2)
GFR calc Af Amer: >60 mL/min (ref >60)
GFR calc non Af Amer: 60 mL/min — ABNORMAL LOW (ref >60)
Glucose, Bld: 84 mg/dL (ref 70–99)
Potassium: 3.7 mEq/L (ref 3.5–5.1)
Sodium: 137 mEq/L (ref 135–145)

## 2010-11-22 NOTE — Group Therapy Note (Signed)
  Cheryl Abbott, Cheryl Abbott             ACCOUNT NO.:  [id number]  MEDICAL RECORD NO.:  [id number]  LOCATION:  A334                          FACILITY:  [facility]  PHYSICIAN:  Doree Albee, M.D.DATE OF BIRTH:  1946/03/24  DATE OF PROCEDURE:  11/22/2010 DATE OF DISCHARGE:                                PROGRESS NOTE   HISTORY OF PRESENT ILLNESS:  This lady was admitted with about a week's history of nausea and 24-hour history of vomiting.  She has also had diarrhea.  All her symptoms appear to have improved significantly since she has been hospitalized and has been put on intravenous fluids.  She was noted to have a high calcium on admission of 12.0 with an albumin of 4.0.  BUN and creatinine were normal when this blood was taken.  PHYSICAL EXAMINATION:  VITAL SIGNS:  Temperature 98.2, blood pressure 98/58 pulse 89, saturation 98% on room air.  She does looks good systemically well and is not clinically shocked despite of blood pressure, heart sounds present.  Normal lung fields, clear.  ABDOMEN: Soft and nontender.  Neurologically, she is intact.  INVESTIGATIONS:  Today, show sodium of 137, potassium 3.7, bicarbonate 30, BUN 15, creatinine 0.94, calcium 10.2.  IMPRESSION: 1. Hypercalcemia of unclear etiology.  Await studies.  Dr. Elie Confer, the     admitting physician has sent blood work for intact parathyroid     hormone levels, vitamin D levels, and blood work to investigate for     multiple myeloma. 2. Nausea and vomiting/diarrhea, this seems to have improved and may     be related to the hypercalcemia or may be a separate illness such     as a viral gastroenteritis. 3. Diabetes, controlled. 4. Hypertension.  Currently hypotensive.  PLAN: 1. Continue with IV fluids. 2. Await investigations for hypercalcemia.  I suspect she has     hyperparathyroidism, especially in view of the fact that she has     history of kidney stones.     Doree Albee, M.D.     NCG/MEDQ  D:   11/22/2010  T:  11/22/2010  Job:  [id number]  Electronically Signed by Hurshel Party M.D. on 11/22/2010 05:05:59 PM

## 2010-11-23 ENCOUNTER — Ambulatory Visit (HOSPITAL_COMMUNITY): Payer: Medicare Other | Admitting: Physical Therapy

## 2010-11-23 ENCOUNTER — Inpatient Hospital Stay (HOSPITAL_COMMUNITY): Payer: Medicare Other

## 2010-11-23 LAB — DIFFERENTIAL
Basophils Absolute: 0 10*3/uL (ref 0.0–0.1)
Basophils Relative: 1 % (ref 0–1)
Eosinophils Absolute: 0.1 10*3/uL (ref 0.0–0.7)
Eosinophils Relative: 2 % (ref 0–5)
Lymphocytes Relative: 39 % (ref 12–46)
Lymphs Abs: 2.5 10*3/uL (ref 0.7–4.0)
Monocytes Absolute: 0.5 10*3/uL (ref 0.1–1.0)
Monocytes Relative: 8 % (ref 3–12)
Neutro Abs: 3.2 10*3/uL (ref 1.7–7.7)
Neutrophils Relative %: 50 % (ref 43–77)

## 2010-11-23 LAB — COMPREHENSIVE METABOLIC PANEL
ALT: 15 U/L (ref 0–35)
AST: 17 U/L (ref 0–37)
Albumin: 3.4 g/dL — ABNORMAL LOW (ref 3.5–5.2)
Alkaline Phosphatase: 71 U/L (ref 39–117)
BUN: 8 mg/dL (ref 6–23)
CO2: 26 mEq/L (ref 19–32)
Calcium: 10.1 mg/dL (ref 8.4–10.5)
Chloride: 106 mEq/L (ref 96–112)
Creatinine, Ser: 0.79 mg/dL (ref 0.4–1.2)
GFR calc Af Amer: >60 mL/min (ref >60)
GFR calc non Af Amer: >60 mL/min (ref >60)
Glucose, Bld: 87 mg/dL (ref 70–99)
Potassium: 4.1 mEq/L (ref 3.5–5.1)
Sodium: 140 mEq/L (ref 135–145)
Total Bilirubin: 0.1 mg/dL — ABNORMAL LOW (ref 0.3–1.2)
Total Protein: 7.4 g/dL (ref 6.0–8.3)

## 2010-11-23 LAB — CBC
HCT: 31 % — ABNORMAL LOW (ref 36.0–46.0)
Hemoglobin: 9.7 g/dL — ABNORMAL LOW (ref 12.0–15.0)
MCH: 23.8 pg — ABNORMAL LOW (ref 26.0–34.0)
MCHC: 31.3 g/dL (ref 30.0–36.0)
MCV: 76.2 fL — ABNORMAL LOW (ref 78.0–100.0)
Platelets: 372 10*3/uL (ref 150–400)
RBC: 4.07 MIL/uL (ref 3.87–5.11)
RDW: 17.8 % — ABNORMAL HIGH (ref 11.5–15.5)
WBC: 6.4 10*3/uL (ref 4.0–10.5)

## 2010-11-23 LAB — GLUCOSE, CAPILLARY
Glucose-Capillary: 101 mg/dL — ABNORMAL HIGH (ref 70–99)
Glucose-Capillary: 133 mg/dL — ABNORMAL HIGH (ref 70–99)

## 2010-11-23 LAB — VITAMIN D 25 HYDROXY (VIT D DEFICIENCY, FRACTURES): Vit D, 25-Hydroxy: 22 ng/mL — ABNORMAL LOW (ref 30–89)

## 2010-11-23 LAB — SEDIMENTATION RATE: Sed Rate: 40 mm/hr — ABNORMAL HIGH (ref 0–22)

## 2010-11-23 MED ORDER — IOHEXOL 300 MG/ML  SOLN
80.0000 mL | Freq: Once | INTRAMUSCULAR | Status: AC | PRN
  Administered 2010-11-23: 80 mL via INTRAVENOUS

## 2010-11-24 LAB — IRON AND TIBC
Iron: 20 ug/dL — ABNORMAL LOW (ref 42–135)
Saturation Ratios: 4 % — ABNORMAL LOW (ref 20–55)
TIBC: 466 ug/dL (ref 250–470)
UIBC: 446 ug/dL

## 2010-11-24 LAB — COMPREHENSIVE METABOLIC PANEL
ALT: 18 U/L (ref 0–35)
AST: 18 U/L (ref 0–37)
Albumin: 3.4 g/dL — ABNORMAL LOW (ref 3.5–5.2)
Alkaline Phosphatase: 71 U/L (ref 39–117)
BUN: 10 mg/dL (ref 6–23)
CO2: 26 mEq/L (ref 19–32)
Calcium: 11.1 mg/dL — ABNORMAL HIGH (ref 8.4–10.5)
Chloride: 103 mEq/L (ref 96–112)
Creatinine, Ser: 1.02 mg/dL (ref 0.50–1.10)
GFR calc Af Amer: >60 mL/min (ref >60)
GFR calc non Af Amer: 54 mL/min — ABNORMAL LOW (ref >60)
Glucose, Bld: 87 mg/dL (ref 70–99)
Potassium: 3.8 mEq/L (ref 3.5–5.1)
Sodium: 138 mEq/L (ref 135–145)
Total Bilirubin: 0.1 mg/dL — ABNORMAL LOW (ref 0.3–1.2)
Total Protein: 7.5 g/dL (ref 6.0–8.3)

## 2010-11-24 LAB — GLUCOSE, CAPILLARY
Glucose-Capillary: 105 mg/dL — ABNORMAL HIGH (ref 70–99)
Glucose-Capillary: 104 mg/dL — ABNORMAL HIGH (ref 70–99)

## 2010-11-24 LAB — PROTEIN ELECTROPH W RFLX QUANT IMMUNOGLOBULINS
Albumin ELP: 51.4 % — ABNORMAL LOW (ref 55.8–66.1)
Alpha-1-Globulin: 4.4 % (ref 2.9–4.9)
Alpha-2-Globulin: 11 % (ref 7.1–11.8)
Beta 2: 6.9 % — ABNORMAL HIGH (ref 3.2–6.5)
Beta Globulin: 8.1 % — ABNORMAL HIGH (ref 4.7–7.2)
Gamma Globulin: 18.2 % (ref 11.1–18.8)
M-Spike, %: 0.29 g/dL
Total Protein ELP: 6.7 g/dL (ref 6.0–8.3)

## 2010-11-24 LAB — FERRITIN: Ferritin: 6 ng/mL — ABNORMAL LOW (ref 10–291)

## 2010-11-24 LAB — IMMUNOFIXATION ADD-ON

## 2010-11-24 LAB — FOLATE: Folate: 11.2 ng/mL

## 2010-11-24 LAB — VITAMIN B12: Vitamin B-12: 270 pg/mL (ref 211–911)

## 2010-11-25 LAB — IGG, IGA, IGM
IgA: 330 mg/dL (ref 69–380)
IgG (Immunoglobin G), Serum: 1220 mg/dL (ref 690–1700)
IgM, Serum: 114 mg/dL (ref 52–322)

## 2010-11-26 NOTE — Discharge Summary (Signed)
NAMESVEA, AUSLEY             ACCOUNT NO.:  [id number]  MEDICAL RECORD NO.:  [id number]  LOCATION:                                 FACILITY:  PHYSICIAN:  Doree Albee, M.D.DATE OF BIRTH:  May 24, 1946  DATE OF ADMISSION:  11/21/2010 DATE OF DISCHARGE:  06/15/2012LH                              DISCHARGE SUMMARY   FINAL DISCHARGE DIAGNOSES: 1. Viral gastroenteritis. 2. Possible hypercalcemia. 3. Microcytic anemia, anemia panel pending. 4. Diabetes. 5. Hypertension.  CONDITION ON DISCHARGE:  Stable.  MEDICATIONS ON DISCHARGE: 1. Colace 100 mg b.i.d.  Continue with home medications. 2. Amlodipine 10 mg daily. 3. Aspirin 81 mg daily. 4. Alprazolam 0.5 mg b.i.d. 5. Ferrous sulfate 325 mg daily. 6. Lovastatin 40 mg daily. 7. Metformin 500 mg 2 tablets b.i.d. 8. Omeprazole 20 mg b.i.d. 9. Paroxetine 40 mg daily. 10.Potassium citrate 540 mg b.i.d. 11.Tramadol 50 mg b.i.d. 12.Acetaminophen 325 mg 2 tablets at bedtime p.r.n.  HISTORY:  This very pleasant 65 year old lady was admitted with symptoms of nausea and vomiting.  Please see initial history and physical examination done by Dr. Marylene Buerger.  HOSPITAL PROGRESS:  The patient was treated with intravenous fluids and antiemetics as required.  She was noted to have a high calcium on admission of 12.0.  Because of this she underwent further testing and vitamin D levels were low at 22.  Other lab work including parathyroid hormone levels are still pending.  ESR was marginally raised at 40.  She was also found to have a microcytic anemia with hemoglobin of 9.7, MCV of 76.2.  An anemia panel has been ordered and is pending.  She has improved significantly during her hospitalization and she is eating 62 at 100% of all meals and there is no vomiting, abdominal pain, or diarrhea.  PHYSICAL EXAMINATION:  VITAL SIGNS:  Today; temperature 98.2, blood pressure 119/71, pulse 83, and saturation 96% on room air. GENERAL:  She looks  systemically well. HEART:  Sounds are present normal. LUNGS:  Fields are clear. ABDOMEN:  Soft and nontender.  INVESTIGATIONS:  Today show a sodium of 138, potassium 3.8, bicarbonate 26, BUN 10, creatinine 1.02, albumin is 3.4 with a calcium showing 11.1. I have spoken to our lab about calcium levels as I have noticed in the last week or so levels in many the patient's have been on the higher side.  She will look into this.  She informed me that they have changed machines in May of this year and I wonder whether this has anything to do with it.  In any case, she did undergo a CT scan of her chest looking for malignancy and there was no evidence of malignancy in the chest. She did have hepatic steatosis and right nephrolithiasis.  There was a report of 3-mm subpleural nodule in the right upper lobe, which likely represents in the subpleural lymph node.  I am not sure that this is very significant.  DISPOSITION:  The patient is now stable to be discharged home.  Lab work is still pending including anemia panel and parathyroid hormone studies. I am slightly suspicious that the hypercalcemia seen on our labs are erroneous.  I have brought this to the  attention of our lab and they will look into this.  I have told the patient to follow up with primary care physician, Dr. Moshe Cipro in the next week or 2.     Doree Albee, M.D.     NCG/MEDQ  D:  11/24/2010  T:  11/24/2010  Job:  [id number]  cc:   Norwood Levo. Moshe Cipro, M.D. [phone]  Electronically Signed by Hurshel Party M.D. on 11/26/2010 07:10:42 AM

## 2010-11-26 NOTE — H&P (Signed)
Cheryl Abbott, Cheryl Abbott             ACCOUNT NO.:  [id number]  MEDICAL RECORD NO.:  [id number]  LOCATION:                                 FACILITY:  PHYSICIAN:  Ulice Bold, MD   DATE OF BIRTH:  December 19, 1945  DATE OF ADMISSION: DATE OF DISCHARGE:  LH                             HISTORY & PHYSICAL   PRIMARY CARE DOCTOR:  Norwood Levo. Moshe Cipro, MD  CHIEF COMPLAINT:  Vomiting.  HISTORY OF PRESENT ILLNESS:  The patient is a 65 year old African American female with a past medical history of recurrent nephrolithiasis who presents to the ER with a chief complaint of vomiting.  History of present illness dates back to this a.m. when the patient started having vomiting.  She said she has had 3 episodes up until now with no blood and only food particles.  Because her vomiting was not getting better, she came to the ER.  Besides the vomiting, the patient also complains of nausea.  She says she is having nausea from past 1 week, but started vomiting this morning.  The patient also complains of abdominal pain, but this only occurs when she has emesis.  Next, the patient complains of malaise and weakness from past 1 week.  The patient also complains of sharp substernal chest pain and she says she feels better when she takes an acid reflux pill.  Pain is intermittent and lasts few seconds.  No complaint of fever or cough.  The patient complains of diarrhea.  She says she has had around 4-5 episodes of diarrhea since this morning.  No blood.  Diarrhea is loose in consistency.  No complaint of syncope.  The patient complains of dizziness.  She said she had dizziness this Monday when her blood sugar dropped.  No complaint of any focal weakness.  No complaint of change in vision.  No complaint of change in hearing.  No complaint of Tums use.  REVIEW OF SYSTEMS:  Negative besides the HPI.  ALLERGIES:  The patient states she is allergic to PENICILLIN that causes her hives.  FAMILY HISTORY:  The  patient's mother died at the age of 67 from dementia.  Father at the age of 42 from CHF.  SOCIAL HISTORY:  The patient is nonsmoker, nondrinker, and denies illegal drug abuse.  PAST MEDICAL HISTORY:  Positive for: 1. Nephrolithiasis. 2. Multiple lithotripsy and stent placement. 3. GERD. 4. Hypertension. 5. Diabetes type 2. 6. Depression. 7. Anxiety attacks. 8. Panic attacks. 9. Chronic back pain. 10.Chronic venous insufficiency. 11.Hyperlipidemia.  MEDICATIONS AS OUTPATIENT: 1. The patient is on potassium citrate 5 mEq p.o. b.i.d. 2. Alprax 0.5 mg p.o. b.i.d. 3. Benazepril 5 mg p.o. daily. 4. Omeprazole 20 mg p.o. daily. 5. The patient is also on Norvasc 10 mg p.o. daily. 6. Lovastatin 40 mg p.o. daily. 7. Indapamide 1.25 mg p.o. daily. 8. Paxil 40 mg p.o. daily. 9. Metformin 1000 mg p.o. b.i.d. 10.Gabapentin 300 mg p.o. at bedtime, but the patient has not filled     this medication for some time.  PHYSICAL EXAMINATION:  VITAL SIGNS:  Blood pressure is 86-92/45-63, pulse 94, respiratory rate 16, temperature afebrile. GENERAL:  The patient is awake, alert, oriented to  time, place and person, is resting comfortably in the bed.  She is well built, well nourished. HEENT:  Pupils equally reactive to light and accommodation.  Extraocular movements are intact.  Head is atraumatic, normocephalic. RESPIRATIONS:  No acute respiratory distress. CHEST:  Clear to auscultation bilaterally. CARDIOVASCULAR:  S1, S2.  Regular rate and rhythm.  No murmurs are appreciated. GI:  Deep bowel sounds present. ABDOMEN:  Soft, nontender, nondistended, obese. EXTREMITIES:  No lower extremity edema.  No cyanosis is seen. CNS:  Cranial nerves II through XII are grossly intact.  The patient is moving all 4 extremities with normal strength. PSYCHIATRY:  The patient's mood and affect are normal.  LABORATORY DATA:  Sodium 137, potassium is 4.7, serum chloride is 94, bicarb is 30, BUN is 31, serum  creatinine is 1, glucose is 70, calcium is 12, albumin is 4.  The patient's hemoglobin is 10.9, platelets 454. The patient's abdominal x-ray shows normal bowel gas pattern with punctate right lower pole calculus.  IMPRESSION: 1. Hypercalcemia.  Etiology has a wide differential.  It could be     volume related versus med related versus other etiologies as     PTH/vitamin D/PTS related peptide. 2. Gastrointestinal.  The patient is being admitted with nausea,     vomiting, and diarrhea, but there is no fever.  No history of blood     and less than 6 episodes have lasted less than 1 day, so this could     be viral or this could be side effects from hypercalcemia. 3. Fluid, electrolytes, nutrition.  The patient is hypovolemic, is     normokalemic, normonatremic. 4. Psychiatry.  The patient has a history of depression. history of     anxiety.  Right now, the patient is stable. 5. Diabetes mellitus type 2.  The patient is stable. 6. Deep venous thrombosis.  We will keep the patient on DVT     prophylaxis. 7. Gastroesophageal reflux disease.  The patient complains of GERD     symptom.  We will increase PPI.  PLAN: 1. We will admit the patient to tele.  We will cycle trops as the     patient is complaining of chest pain.  It is atypical, but we will     rule out MI. 2. We will give the patient IV fluids. 3. We will not start Lasix as the patient is hypovolemic, so for now     we will replete with IV fluids and as the patient is repleted, we     will follow BNP to see whether she needs Lasix or not next. 4. We will hold indapamide as indapamide might be contributing to her     hypercalcemia. 5. We will hold ACE and calcium blockers as the patient is on     hypotensive side. 6. We will get PTH intact, we will get PTH related peptide, vitamin D     25 hydroxy, vitamin D 1,25 hydroxy.  We will get SPEP and UPEP.  The     patient's further clinical course depends how the patient does with      IV fluids and rest of the treatment.     Ulice Bold, MD     MB/MEDQ  D:  11/21/2010  T:  11/22/2010  Job:  [id number]  Electronically Signed by Ulice Bold M.D. on 11/26/2010 12:21:24 PM

## 2010-11-26 NOTE — Group Therapy Note (Signed)
  Cheryl Abbott, RATTERMAN             ACCOUNT NO.:  [id number]  MEDICAL RECORD NO.:  [id number]  LOCATION:                                 FACILITY:  PHYSICIAN:  Doree Albee, M.D.DATE OF BIRTH:  July 03, 1945  DATE OF PROCEDURE:  11/23/2010 DATE OF DISCHARGE:                                PROGRESS NOTE   This lady has clearly improved.  She has not had any further diarrhea, and she is able to tolerate food.  PHYSICAL EXAMINATION:  VITAL SIGNS:  Temperature 98, blood pressure 116/77, pulse 76, saturation 97% on room air. GENERAL:  She looks systemically well. CARDIAC:  Heart sounds are present and normal. CHEST:  Lung fields clear. ABDOMEN:  Soft and nontender.  INVESTIGATIONS:  Sodium 140, potassium 4.1, bicarbonate 26, BUN 8, creatinine 0.79, calcium 10.1 with an albumin of 3.4.  ESR is only marginally raised at 40.  Hemoglobin 9.7 with a microcytic picture, MCV of 76.2, white blood cell count 6.4, platelets 372.  Vitamin D level is actually low at 22, TSH is normal range.  IMPRESSION: 1. Nausea, vomiting, and diarrhea, improved, possibly viral in nature. 2. Hypercalcemia of unclear etiology, awaiting investigations. 3. Microcytic anemia. 4. Diabetes, controlled. 5. Hypertension, controlled.  PLAN: 1. Discontinue IV fluids now and encourage p.o. intake. 2. Discontinue telemetry. 3. Anemia panel. 4. CT chest scan to rule out malignancy as a cause of hypercalcemia.     Doree Albee, M.D.    NCG/MEDQ  D:  11/23/2010  T:  11/23/2010  Job:  [id number]  Electronically Signed by Hurshel Party M.D. on 11/26/2010 07:10:48 AM

## 2010-11-27 LAB — UIFE/LIGHT CHAINS/TP QN, 24-HR UR
Albumin, U: DETECTED
Alpha 1, Urine: DETECTED — AB
Alpha 2, Urine: DETECTED — AB
Beta, Urine: DETECTED — AB
Free Kappa Lt Chains,Ur: 3.25 mg/dL — ABNORMAL HIGH (ref 0.14–2.42)
Free Kappa/Lambda Ratio: 11.21 ratio — ABNORMAL HIGH (ref 2.04–10.37)
Free Lambda Excretion/Day: 6.09 mg/d
Free Lambda Lt Chains,Ur: 0.29 mg/dL (ref 0.02–0.67)
Free Lt Chn Excr Rate: 68.25 mg/d
Gamma Globulin, Urine: DETECTED — AB
Time: 24 hours
Total Protein, Urine-Ur/day: 84 mg/d (ref 10–140)
Total Protein, Urine: 4 mg/dL
Volume, Urine: 2100 mL

## 2010-11-28 ENCOUNTER — Telehealth: Payer: Self-pay | Admitting: Family Medicine

## 2010-11-28 ENCOUNTER — Ambulatory Visit (HOSPITAL_COMMUNITY): Payer: Medicare Other | Admitting: *Deleted

## 2010-11-28 ENCOUNTER — Encounter: Payer: Self-pay | Admitting: Family Medicine

## 2010-11-28 DIAGNOSIS — I10 Essential (primary) hypertension: Secondary | ICD-10-CM

## 2010-11-28 DIAGNOSIS — E785 Hyperlipidemia, unspecified: Secondary | ICD-10-CM

## 2010-11-28 DIAGNOSIS — E119 Type 2 diabetes mellitus without complications: Secondary | ICD-10-CM

## 2010-11-28 NOTE — Assessment & Plan Note (Signed)
Antifungal prescribed, if no improvement , pt will be referred to dermatology, she will call back

## 2010-11-28 NOTE — Telephone Encounter (Signed)
Pt needs hospital f/u visit next week,between Thursday or Friday preferrably, and will need fasting lipid, hepatic, chem 7 and PTH before the visit pls let her know and order the tests, also transfer her to sched  the appt if possible

## 2010-11-28 NOTE — Assessment & Plan Note (Signed)
Unchanged, lifestyle change encouraged to facilitate weight loss

## 2010-11-28 NOTE — Assessment & Plan Note (Signed)
Low fat diet discussed. Pt needs labs , they are past due, she is to continue lovastatin at current dose

## 2010-11-28 NOTE — Telephone Encounter (Signed)
Labs ordered patient aware

## 2010-11-28 NOTE — Assessment & Plan Note (Signed)
Controlled, no change in medication  

## 2010-11-29 LAB — BASIC METABOLIC PANEL
BUN: 20 mg/dL (ref 6–23)
CO2: 26 mEq/L (ref 19–32)
Calcium: 10.5 mg/dL (ref 8.4–10.5)
Chloride: 98 mEq/L (ref 96–112)
Creat: 1.15 mg/dL — ABNORMAL HIGH (ref 0.50–1.10)
Glucose, Bld: 91 mg/dL (ref 70–99)
Potassium: 4.5 mEq/L (ref 3.5–5.3)
Sodium: 135 mEq/L (ref 135–145)

## 2010-11-29 LAB — LIPID PANEL
Cholesterol: 166 mg/dL (ref 0–200)
HDL: 38 mg/dL — ABNORMAL LOW (ref >39)
LDL Cholesterol: 107 mg/dL — ABNORMAL HIGH (ref 0–99)
Total CHOL/HDL Ratio: 4.4 Ratio
Triglycerides: 107 mg/dL (ref <150)
VLDL: 21 mg/dL (ref 0–40)

## 2010-11-29 LAB — HEPATIC FUNCTION PANEL
ALT: 25 U/L (ref 0–35)
AST: 20 U/L (ref 0–37)
Albumin: 4.6 g/dL (ref 3.5–5.2)
Alkaline Phosphatase: 77 U/L (ref 39–117)
Bilirubin, Direct: <0.1 mg/dL (ref 0.0–0.3)
Total Bilirubin: 0.3 mg/dL (ref 0.3–1.2)
Total Protein: 8.4 g/dL — ABNORMAL HIGH (ref 6.0–8.3)

## 2010-11-30 ENCOUNTER — Ambulatory Visit (INDEPENDENT_AMBULATORY_CARE_PROVIDER_SITE_OTHER): Payer: Medicare Other | Admitting: Family Medicine

## 2010-11-30 ENCOUNTER — Encounter: Payer: Self-pay | Admitting: Family Medicine

## 2010-11-30 ENCOUNTER — Ambulatory Visit (HOSPITAL_COMMUNITY): Payer: Medicare Other | Admitting: *Deleted

## 2010-11-30 DIAGNOSIS — E119 Type 2 diabetes mellitus without complications: Secondary | ICD-10-CM

## 2010-11-30 DIAGNOSIS — I10 Essential (primary) hypertension: Secondary | ICD-10-CM

## 2010-11-30 DIAGNOSIS — R110 Nausea: Secondary | ICD-10-CM

## 2010-11-30 HISTORY — DX: Hypercalcemia: E83.52

## 2010-11-30 LAB — PTH, INTACT AND CALCIUM
Calcium, Total (PTH): 10.5 mg/dL (ref 8.4–10.5)
PTH: 45.9 pg/mL (ref 14.0–72.0)

## 2010-11-30 MED ORDER — PROMETHAZINE HCL 12.5 MG PO TABS: 12.5000 mg | ORAL_TABLET | Freq: Three times a day (TID) | ORAL | Status: AC | PRN

## 2010-11-30 MED ORDER — ONDANSETRON HCL 2 MG/ML IJ SOLN
4.0000 mg | Freq: Once | INTRAMUSCULAR | Status: AC
  Administered 2010-11-30: 4 mg via INTRAMUSCULAR

## 2010-11-30 MED ORDER — AMLODIPINE BESYLATE 5 MG PO TABS: ORAL_TABLET | ORAL | Status: DC

## 2010-11-30 NOTE — Patient Instructions (Addendum)
F/u in 4 weeks.  Med will be given for nausea, and med will be sent in.  You will be referred to Dr Dorris Fetch, an endocrinologist about your elevated calcium

## 2010-12-02 LAB — PTH-RELATED PEPTIDE

## 2010-12-05 ENCOUNTER — Emergency Department (HOSPITAL_COMMUNITY)
Admission: EM | Admit: 2010-12-05 | Discharge: 2010-12-05 | Disposition: A | Discharge status: Home / Self Care | Payer: Medicare Other | Attending: Emergency Medicine | Admitting: Emergency Medicine

## 2010-12-05 ENCOUNTER — Ambulatory Visit (HOSPITAL_COMMUNITY)
Admission: RE | Admit: 2010-12-05 | Discharge: 2010-12-05 | Disposition: A | Discharge status: Home / Self Care | Payer: Medicare Other | Source: Ambulatory Visit | Attending: Family Medicine | Admitting: Family Medicine

## 2010-12-05 ENCOUNTER — Ambulatory Visit (HOSPITAL_COMMUNITY): Payer: Medicare Other | Admitting: *Deleted

## 2010-12-05 DIAGNOSIS — F411 Generalized anxiety disorder: Secondary | ICD-10-CM | POA: Insufficient documentation

## 2010-12-05 DIAGNOSIS — R197 Diarrhea, unspecified: Secondary | ICD-10-CM | POA: Insufficient documentation

## 2010-12-05 DIAGNOSIS — E7800 Pure hypercholesterolemia, unspecified: Secondary | ICD-10-CM | POA: Insufficient documentation

## 2010-12-05 DIAGNOSIS — I10 Essential (primary) hypertension: Secondary | ICD-10-CM | POA: Insufficient documentation

## 2010-12-05 DIAGNOSIS — Z79899 Other long term (current) drug therapy: Secondary | ICD-10-CM | POA: Insufficient documentation

## 2010-12-05 DIAGNOSIS — F329 Major depressive disorder, single episode, unspecified: Secondary | ICD-10-CM | POA: Insufficient documentation

## 2010-12-05 DIAGNOSIS — E119 Type 2 diabetes mellitus without complications: Secondary | ICD-10-CM | POA: Insufficient documentation

## 2010-12-05 DIAGNOSIS — F3289 Other specified depressive episodes: Secondary | ICD-10-CM | POA: Insufficient documentation

## 2010-12-05 DIAGNOSIS — R112 Nausea with vomiting, unspecified: Secondary | ICD-10-CM | POA: Insufficient documentation

## 2010-12-05 DIAGNOSIS — K219 Gastro-esophageal reflux disease without esophagitis: Secondary | ICD-10-CM | POA: Insufficient documentation

## 2010-12-05 LAB — COMPREHENSIVE METABOLIC PANEL
ALT: 25 U/L (ref 0–35)
AST: 18 U/L (ref 0–37)
Albumin: 3.6 g/dL (ref 3.5–5.2)
Alkaline Phosphatase: 73 U/L (ref 39–117)
BUN: 20 mg/dL (ref 6–23)
CO2: 27 mEq/L (ref 19–32)
Calcium: 10.2 mg/dL (ref 8.4–10.5)
Chloride: 97 mEq/L (ref 96–112)
Creatinine, Ser: 1.31 mg/dL — ABNORMAL HIGH (ref 0.50–1.10)
GFR calc Af Amer: 49 mL/min — ABNORMAL LOW (ref >60)
GFR calc non Af Amer: 41 mL/min — ABNORMAL LOW (ref >60)
Glucose, Bld: 105 mg/dL — ABNORMAL HIGH (ref 70–99)
Potassium: 4.2 mEq/L (ref 3.5–5.1)
Sodium: 136 mEq/L (ref 135–145)
Total Bilirubin: 0.1 mg/dL — ABNORMAL LOW (ref 0.3–1.2)
Total Protein: 8 g/dL (ref 6.0–8.3)

## 2010-12-05 LAB — CBC
HCT: 31.7 % — ABNORMAL LOW (ref 36.0–46.0)
Hemoglobin: 10.2 g/dL — ABNORMAL LOW (ref 12.0–15.0)
MCH: 24.2 pg — ABNORMAL LOW (ref 26.0–34.0)
MCHC: 32.2 g/dL (ref 30.0–36.0)
MCV: 75.3 fL — ABNORMAL LOW (ref 78.0–100.0)
Platelets: 432 10*3/uL — ABNORMAL HIGH (ref 150–400)
RBC: 4.21 MIL/uL (ref 3.87–5.11)
RDW: 18.1 % — ABNORMAL HIGH (ref 11.5–15.5)
WBC: 9.5 10*3/uL (ref 4.0–10.5)

## 2010-12-05 LAB — DIFFERENTIAL
Basophils Absolute: 0 10*3/uL (ref 0.0–0.1)
Basophils Relative: 0 % (ref 0–1)
Eosinophils Absolute: 0.1 10*3/uL (ref 0.0–0.7)
Eosinophils Relative: 1 % (ref 0–5)
Lymphocytes Relative: 32 % (ref 12–46)
Lymphs Abs: 3 10*3/uL (ref 0.7–4.0)
Monocytes Absolute: 0.9 10*3/uL (ref 0.1–1.0)
Monocytes Relative: 9 % (ref 3–12)
Neutro Abs: 5.5 10*3/uL (ref 1.7–7.7)
Neutrophils Relative %: 58 % (ref 43–77)

## 2010-12-05 LAB — URINALYSIS, ROUTINE W REFLEX MICROSCOPIC
Bilirubin Urine: NEGATIVE
Glucose, UA: NEGATIVE mg/dL
Hgb urine dipstick: NEGATIVE
Ketones, ur: NEGATIVE mg/dL
Leukocytes, UA: NEGATIVE
Nitrite: NEGATIVE
Protein, ur: NEGATIVE mg/dL
Specific Gravity, Urine: 1.02 (ref 1.005–1.030)
Urobilinogen, UA: 0.2 mg/dL (ref 0.0–1.0)
pH: 6 (ref 5.0–8.0)

## 2010-12-05 LAB — LIPASE, BLOOD: Lipase: 37 U/L (ref 11–59)

## 2010-12-07 ENCOUNTER — Ambulatory Visit (HOSPITAL_COMMUNITY)
Admission: RE | Admit: 2010-12-07 | Discharge: 2010-12-07 | Disposition: A | Discharge status: Home / Self Care | Payer: Medicare Other | Source: Ambulatory Visit | Attending: Family Medicine | Admitting: Family Medicine

## 2010-12-07 ENCOUNTER — Ambulatory Visit (HOSPITAL_COMMUNITY): Payer: Medicare Other | Admitting: *Deleted

## 2010-12-08 ENCOUNTER — Other Ambulatory Visit (HOSPITAL_COMMUNITY): Payer: Self-pay | Admitting: "Endocrinology

## 2010-12-08 ENCOUNTER — Encounter: Payer: Self-pay | Admitting: Family Medicine

## 2010-12-11 ENCOUNTER — Ambulatory Visit (INDEPENDENT_AMBULATORY_CARE_PROVIDER_SITE_OTHER): Payer: Medicare Other | Admitting: Family Medicine

## 2010-12-11 ENCOUNTER — Encounter: Payer: Self-pay | Admitting: Family Medicine

## 2010-12-11 VITALS — BP 112/72 | HR 108 | Resp 16 | Ht 66.5 in | Wt 202.4 lb

## 2010-12-11 DIAGNOSIS — E119 Type 2 diabetes mellitus without complications: Secondary | ICD-10-CM

## 2010-12-11 DIAGNOSIS — K219 Gastro-esophageal reflux disease without esophagitis: Secondary | ICD-10-CM

## 2010-12-11 DIAGNOSIS — F411 Generalized anxiety disorder: Secondary | ICD-10-CM

## 2010-12-11 DIAGNOSIS — N3000 Acute cystitis without hematuria: Secondary | ICD-10-CM

## 2010-12-11 DIAGNOSIS — I10 Essential (primary) hypertension: Secondary | ICD-10-CM

## 2010-12-11 DIAGNOSIS — E785 Hyperlipidemia, unspecified: Secondary | ICD-10-CM

## 2010-12-11 MED ORDER — LOVASTATIN 40 MG PO TABS
40.0000 mg | ORAL_TABLET | Freq: Every day | ORAL | Status: DC
Start: 2010-12-11 — End: 2011-01-11

## 2010-12-11 MED ORDER — CIPROFLOXACIN HCL 500 MG PO TABS: 500.0000 mg | ORAL_TABLET | Freq: Two times a day (BID) | ORAL | Status: AC

## 2010-12-11 MED ORDER — PAROXETINE HCL 40 MG PO TABS: 40.0000 mg | ORAL_TABLET | ORAL | Status: DC

## 2010-12-11 MED ORDER — METFORMIN HCL 500 MG PO TABS: 500.0000 mg | ORAL_TABLET | Freq: Two times a day (BID) | ORAL | Status: DC

## 2010-12-11 MED ORDER — OMEPRAZOLE 20 MG PO CPDR: 20.0000 mg | DELAYED_RELEASE_CAPSULE | Freq: Every day | ORAL | Status: DC

## 2010-12-11 NOTE — Assessment & Plan Note (Signed)
Pt to be refered to endocrine for further evaluaton

## 2010-12-11 NOTE — Progress Notes (Signed)
  Subjective:    Patient ID: Cheryl Abbott, female    DOB: 1945/09/18, 65 y.o.   MRN: [id number]  HPI Pt here for f/u of recent hospitalization, she went in with acute gastroenteritis, and during the course of her stay , her markedly elevated calcium became enough of a concern to warrant further outpatient evaluation. She still feels somewhat weak, however her symptoms have improved and she reports signifcant recovery.   Review of Systems Denies recent fever or chills. Denies sinus pressure, nasal congestion, ear pain or sore throat. Denies chest congestion, productive cough or wheezing. Denies chest pains, palpitations, paroxysmal nocturnal dyspnea, orthopnea and leg swelling Denies abdominal pain, however still experiencing significant nausea Denies dysuria, frequency, hesitancy or incontinence. Chronic back pain and reduced mobility Denies headaches, seizure, numbness, or tingling. Denies uncontrolled  depression, anxiety or insomnia. Denies skin break down or rash.        Objective:   Physical Exam Patient alert and oriented and in no Cardiopulmonary distress.  HEENT: No facial asymmetry, EOMI, no sinus tenderness, TM's clear, Oropharynx pink and moist.  Neck supple no adenopathy.  Chest: Clear to auscultation bilaterally.  CVS: S1, S2 no murmurs, no S3.  ABD: Soft non tender. Bowel sounds normal.  Ext: No edema  MS: decreased ROM spine,adequate in  shoulders, hips and knees.  Skin: Intact, no ulcerations or rash noted.  Psych: Good eye contact, normal affect. Memory intact not anxious or depressed appearing.  CNS: CN 2-12 intact, power, tone and sensation normal throughout.        Assessment & Plan:

## 2010-12-11 NOTE — Assessment & Plan Note (Signed)
Controlled but will add ace for renal protection,. And lower amlodipine dose

## 2010-12-11 NOTE — Assessment & Plan Note (Signed)
Uncontrolled , zofran administered and medication also sent in

## 2010-12-11 NOTE — Progress Notes (Signed)
  Subjective:    Patient ID: ALLEANE GLUCK, female    DOB: 1946-05-03, 65 y.o.   MRN: [id number]  HPI Pt in for Ed f/u , states after pT for back pain, she felt light headed and went to the ed was told she was dehydrated. States she has been having smelly urine for the past   approx 1 week, no fever , chills , flank pain or nausea. Has developed , tickle in throat and nocturnal cough ever since starting benazepril, denies sputum or nasal drainage Has seen endocrinologist 3 days ago has upcoming neck scan  And will follow up after that regarding her hypercalcemia      Review of Systems . Denies chest pains, palpitations, paroxysmal nocturnal dyspnea, orthopnea and leg swelling Denies abdominal pain, nausea, vomiting,diarrhea or constipation.  Denies rectal bleeding or change in bowel movement. Chronic mid and low back pain and limitation in mobility. Denies headaches, seizure, numbness, or tingling. Denies uncontrolled  depression, anxiety or insomnia. Denies skin break down or rash.        Objective:   Physical Exam Patient alert and oriented and in no Cardiopulmonary distress.  HEENT: No facial asymmetry, EOMI, no sinus tenderness, TM's clear, Oropharynx pink and moist.  Neck supple no adenopathy.  Chest: Clear to auscultation bilaterally.  CVS: S1, S2 no murmurs, no S3.  ABD: Soft non tender. Bowel sounds normal.  Ext: No edema  MS: decreased  ROM spine, shoulders, hips and knees.  Skin: Intact, no ulcerations or rash noted.  Psych: Good eye contact, normal affect. Memory intact  anxious but not  depressed appearing.  CNS: CN 2-12 intact, power, tone and sensation normal throughout.        Assessment & Plan:

## 2010-12-11 NOTE — Assessment & Plan Note (Signed)
Pt catheterized, since unable to void , and cloudy specimen sent for c/s, antibiotics started based on history

## 2010-12-11 NOTE — Assessment & Plan Note (Signed)
Controlled, no change in medication  

## 2010-12-11 NOTE — Assessment & Plan Note (Signed)
Controlled, however has ACE intolerance , she is to discontinue same

## 2010-12-11 NOTE — Assessment & Plan Note (Signed)
Currently being evaluated by endocrine

## 2010-12-11 NOTE — Patient Instructions (Addendum)
F/u as before.4 to 6 weeks please  Pls stop the benazepril, I believe this is the cause of your cough.  Med is sent in for a UTI, if I am able to obtain a urine specimen this will be sent for culture

## 2010-12-12 ENCOUNTER — Ambulatory Visit (HOSPITAL_COMMUNITY)
Admission: RE | Admit: 2010-12-12 | Discharge: 2010-12-12 | Disposition: A | Discharge status: Home / Self Care | Payer: Medicare Other | Source: Ambulatory Visit | Attending: Family Medicine | Admitting: Family Medicine

## 2010-12-12 ENCOUNTER — Ambulatory Visit (HOSPITAL_COMMUNITY)
Admission: RE | Admit: 2010-12-12 | Discharge: 2010-12-12 | Disposition: A | Discharge status: Home / Self Care | Payer: Medicare Other | Source: Ambulatory Visit | Attending: "Endocrinology | Admitting: "Endocrinology

## 2010-12-12 DIAGNOSIS — M545 Low back pain, unspecified: Secondary | ICD-10-CM | POA: Insufficient documentation

## 2010-12-12 DIAGNOSIS — IMO0001 Reserved for inherently not codable concepts without codable children: Secondary | ICD-10-CM | POA: Insufficient documentation

## 2010-12-12 DIAGNOSIS — M6281 Muscle weakness (generalized): Secondary | ICD-10-CM | POA: Insufficient documentation

## 2010-12-12 DIAGNOSIS — I10 Essential (primary) hypertension: Secondary | ICD-10-CM | POA: Insufficient documentation

## 2010-12-12 DIAGNOSIS — E119 Type 2 diabetes mellitus without complications: Secondary | ICD-10-CM | POA: Insufficient documentation

## 2010-12-12 DIAGNOSIS — R269 Unspecified abnormalities of gait and mobility: Secondary | ICD-10-CM | POA: Insufficient documentation

## 2010-12-13 LAB — URINE CULTURE: Colony Count: >=100000

## 2010-12-14 ENCOUNTER — Other Ambulatory Visit: Payer: Self-pay | Admitting: Family Medicine

## 2010-12-14 ENCOUNTER — Ambulatory Visit (HOSPITAL_COMMUNITY): Payer: Medicare Other | Admitting: Physical Therapy

## 2010-12-14 ENCOUNTER — Ambulatory Visit (HOSPITAL_COMMUNITY): Payer: Medicare Other | Admitting: *Deleted

## 2010-12-14 ENCOUNTER — Ambulatory Visit (HOSPITAL_COMMUNITY)
Admission: RE | Admit: 2010-12-14 | Discharge: 2010-12-14 | Disposition: A | Discharge status: Home / Self Care | Payer: Medicare Other | Source: Ambulatory Visit | Admitting: Physical Therapy

## 2010-12-14 MED ORDER — NITROFURANTOIN MONOHYD MACRO 100 MG PO CAPS: 100.0000 mg | ORAL_CAPSULE | Freq: Two times a day (BID) | ORAL | Status: DC

## 2010-12-18 ENCOUNTER — Other Ambulatory Visit (HOSPITAL_COMMUNITY): Payer: Self-pay | Admitting: "Endocrinology

## 2010-12-18 ENCOUNTER — Emergency Department (HOSPITAL_COMMUNITY): Payer: Medicare Other

## 2010-12-18 ENCOUNTER — Encounter (HOSPITAL_COMMUNITY): Payer: Self-pay

## 2010-12-18 ENCOUNTER — Emergency Department (HOSPITAL_COMMUNITY)
Admission: EM | Admit: 2010-12-18 | Discharge: 2010-12-19 | Disposition: A | Discharge status: Home / Self Care | Payer: Medicare Other | Attending: Emergency Medicine | Admitting: Emergency Medicine

## 2010-12-18 ENCOUNTER — Telehealth: Payer: Self-pay | Admitting: Family Medicine

## 2010-12-18 DIAGNOSIS — R109 Unspecified abdominal pain: Secondary | ICD-10-CM | POA: Insufficient documentation

## 2010-12-18 DIAGNOSIS — I10 Essential (primary) hypertension: Secondary | ICD-10-CM | POA: Insufficient documentation

## 2010-12-18 DIAGNOSIS — E119 Type 2 diabetes mellitus without complications: Secondary | ICD-10-CM | POA: Insufficient documentation

## 2010-12-18 DIAGNOSIS — N390 Urinary tract infection, site not specified: Secondary | ICD-10-CM

## 2010-12-18 LAB — COMPREHENSIVE METABOLIC PANEL
ALT: 21 U/L (ref 0–35)
AST: 21 U/L (ref 0–37)
Albumin: 3.8 g/dL (ref 3.5–5.2)
Alkaline Phosphatase: 77 U/L (ref 39–117)
BUN: 19 mg/dL (ref 6–23)
CO2: 27 mEq/L (ref 19–32)
Calcium: 10.3 mg/dL (ref 8.4–10.5)
Chloride: 96 mEq/L (ref 96–112)
Creatinine, Ser: 1.23 mg/dL — ABNORMAL HIGH (ref 0.50–1.10)
GFR calc Af Amer: 53 mL/min — ABNORMAL LOW (ref >60)
GFR calc non Af Amer: 44 mL/min — ABNORMAL LOW (ref >60)
Glucose, Bld: 119 mg/dL — ABNORMAL HIGH (ref 70–99)
Potassium: 3.6 mEq/L (ref 3.5–5.1)
Sodium: 134 mEq/L — ABNORMAL LOW (ref 135–145)
Total Bilirubin: 0.2 mg/dL — ABNORMAL LOW (ref 0.3–1.2)
Total Protein: 8.6 g/dL — ABNORMAL HIGH (ref 6.0–8.3)

## 2010-12-18 LAB — CBC
HCT: 31.4 % — ABNORMAL LOW (ref 36.0–46.0)
Hemoglobin: 10.1 g/dL — ABNORMAL LOW (ref 12.0–15.0)
MCH: 24 pg — ABNORMAL LOW (ref 26.0–34.0)
MCHC: 32.2 g/dL (ref 30.0–36.0)
MCV: 74.8 fL — ABNORMAL LOW (ref 78.0–100.0)
Platelets: 411 10*3/uL — ABNORMAL HIGH (ref 150–400)
RBC: 4.2 MIL/uL (ref 3.87–5.11)
RDW: 18.5 % — ABNORMAL HIGH (ref 11.5–15.5)
WBC: 17.4 10*3/uL — ABNORMAL HIGH (ref 4.0–10.5)

## 2010-12-18 LAB — LIPASE, BLOOD: Lipase: 22 U/L (ref 11–59)

## 2010-12-18 MED ORDER — ONDANSETRON 8 MG PO TBDP
8.0000 mg | ORAL_TABLET | Freq: Once | ORAL | Status: AC
  Administered 2010-12-18: 8 mg via ORAL
  Filled 2010-12-18: qty 1

## 2010-12-18 MED ORDER — SODIUM CHLORIDE 0.9 % IV SOLN
Freq: Once | INTRAVENOUS | Status: AC
  Administered 2010-12-18: 18:00:00 via INTRAVENOUS

## 2010-12-18 MED ORDER — PROMETHAZINE HCL 25 MG PO TABS: 25.0000 mg | ORAL_TABLET | Freq: Four times a day (QID) | ORAL | Status: AC | PRN

## 2010-12-18 MED ORDER — CIPROFLOXACIN IN D5W 400 MG/200ML IV SOLN
400.0000 mg | Freq: Once | INTRAVENOUS | Status: AC
  Administered 2010-12-18: 400 mg via INTRAVENOUS
  Filled 2010-12-18: qty 200

## 2010-12-18 MED ORDER — SODIUM CHLORIDE 0.9 % IV BOLUS (SEPSIS)
1000.0000 mL | Freq: Once | INTRAVENOUS | Status: AC
  Administered 2010-12-18: 1000 mL via INTRAVENOUS
  Filled 2010-12-18: qty 1000

## 2010-12-18 NOTE — ED Provider Notes (Addendum)
History     Chief Complaint  Patient presents with  . Nausea  . Emesis  . Dizziness   Patient is a 65 y.o. female presenting with vomiting and flank pain. The history is provided by the patient. No language interpreter was used.  Emesis  This is a new problem. Episode onset: 3 days ago. Episode frequency: "frequently" The problem has not changed since onset.The emesis has an appearance of stomach contents. There has been no fever. Associated symptoms include diarrhea. Pertinent negatives include no abdominal pain, no chills, no cough, no fever and no headaches.  Flank Pain This is a new problem. Episode onset: 3 days ago. Episode frequency: intermittently. The problem has not changed since onset.Pertinent negatives include no chest pain, no abdominal pain, no headaches and no shortness of breath. The symptoms are aggravated by nothing. The symptoms are relieved by nothing. She has tried nothing for the symptoms.  Patient's doctor put patient on Cipro for kidney infection started 3 days ago. Reports severe nausea, vomiting and diarrhea the day after starting Cipro with associated intermittent right flank pain. Patient denies right flank pain currently. Also notes she has been incontinent of urine intermittently. Denies blood in stool, hematemesis, hematuria. Notes h/o of kidney stones, diabetes, hypertension and abdominal surgery to remove kidney stones.   Past Medical History  Diagnosis Date  . Anxiety disorder   . Back pain   . Diabetes mellitus type 1   . Hyperlipidemia   . Hypertension   . Sleep apnea   . Disease of vein     Past Surgical History  Procedure Date  . Partial hysterectomy   . Extraction of left kidney stone   . Lithotryspy and stent replacement rt ureter 10/2004    12/2009 Dr, Michela Pitcher   . Removal of lump from posterior r thigh   . Bilateral stone removal with nephrostomy at [facility] October 2011    Family History  Problem Relation Age of Onset  .  Hypertension Mother   . Diabetes Mother   . Heart failure Mother   . Dementia Mother   . Emphysema Father   . Hypertension Father   . Diabetes Brother   . GER disease Brother   . Cancer      family history   . Diabetes      family history   . Heart defect      famiily history   . Arthritis      family history     History  Substance Use Topics  . Smoking status: Never Smoker   . Smokeless tobacco: Not on file  . Alcohol Use: No    OB History    Grav Para Term Preterm Abortions TAB SAB Ect Mult Living                  Review of Systems  Constitutional: Negative for fever and chills.  HENT: Negative for rhinorrhea.   Eyes: Negative for pain.  Respiratory: Negative for cough and shortness of breath.   Cardiovascular: Negative for chest pain.  Gastrointestinal: Positive for nausea, vomiting and diarrhea. Negative for abdominal pain and blood in stool.       No hematemesis  Genitourinary: Positive for flank pain. Negative for dysuria and hematuria.       Urinary incontinence.  Musculoskeletal: Negative for back pain.  Skin: Negative for rash.  Neurological: Negative for weakness and headaches.    Physical Exam  BP 125/67  Pulse 129  Temp(Src) 99.6  F (37.6 C) (Oral)  Resp 22  Ht 5\' 7"  (1.702 m)  Wt 200 lb (90.719 kg)  BMI 31.32 kg/m2  SpO2 99%  Physical Exam  Constitutional: She is oriented to person, place, and time. She appears well-developed and well-nourished.       Uncomfortable appearing.   HENT:  Head: Normocephalic and atraumatic.  Eyes: Conjunctivae are normal. Pupils are equal, round, and reactive to light.  Neck: Neck supple.  Cardiovascular: Normal rate, regular rhythm, intact distal pulses and normal pulses.  Exam reveals no gallop and no friction rub.   No murmur heard. Pulmonary/Chest: Effort normal and breath sounds normal. She has no wheezes.  Abdominal: Soft. Bowel sounds are normal. She exhibits no distension. There is no tenderness.  There is no rebound, no guarding and no CVA tenderness.  Musculoskeletal: Normal range of motion. She exhibits no edema.  Neurological: She is alert and oriented to person, place, and time. No sensory deficit.  Skin: Skin is warm and dry.  Psychiatric: She has a normal mood and affect. Her behavior is normal.    ED Course  Procedures Recheck- 9:09PM, Patient reports nausea has improved. Will attempt to drink water. MDM PT wants to go home, UA sent U Cx pending, is already on Tx for UTI. Plan RX phenergan and strict return precautions with close PCP follow up.    I personally performed the services described in this documentation, which was scribed in my presence. The recorded information has been reviewed and considered.     Teressa Lower, MD 12/18/10 2316  Teressa Lower, MD 01/13/11 2041

## 2010-12-18 NOTE — Telephone Encounter (Signed)
Advised patient to try otc dramamine, if she gets no relief and continues to vomit she will need to discontinue antibiotic so she does not become dehydrated, patient aware

## 2010-12-18 NOTE — ED Notes (Signed)
Pt presents with n/v and dizziness since yesterday. Pt actively vomiting in triage. Pt to triage in w/c.

## 2010-12-19 LAB — URINALYSIS, ROUTINE W REFLEX MICROSCOPIC
Bilirubin Urine: NEGATIVE
Glucose, UA: NEGATIVE mg/dL
Ketones, ur: NEGATIVE mg/dL
Nitrite: NEGATIVE
Protein, ur: NEGATIVE mg/dL
Specific Gravity, Urine: 1.01 (ref 1.005–1.030)
Urobilinogen, UA: 0.2 mg/dL (ref 0.0–1.0)
pH: 7 (ref 5.0–8.0)

## 2010-12-19 LAB — URINE MICROSCOPIC-ADD ON

## 2010-12-19 NOTE — ED Notes (Signed)
Given gingerale and  Sandwich,   Family member with pt

## 2010-12-20 ENCOUNTER — Inpatient Hospital Stay (HOSPITAL_COMMUNITY): Admission: RE | Admit: 2010-12-20 | Payer: Medicare Other | Source: Ambulatory Visit | Admitting: Physical Therapy

## 2010-12-20 LAB — URINE CULTURE
Colony Count: 7000
Culture  Setup Time: 201207102142

## 2010-12-21 ENCOUNTER — Other Ambulatory Visit: Payer: Self-pay | Admitting: Family Medicine

## 2010-12-22 ENCOUNTER — Ambulatory Visit (HOSPITAL_COMMUNITY)
Admission: RE | Admit: 2010-12-22 | Discharge: 2010-12-22 | Disposition: A | Discharge status: Home / Self Care | Payer: Medicare Other | Source: Ambulatory Visit | Attending: Family Medicine | Admitting: Family Medicine

## 2010-12-22 DIAGNOSIS — IMO0002 Reserved for concepts with insufficient information to code with codable children: Secondary | ICD-10-CM | POA: Insufficient documentation

## 2010-12-22 DIAGNOSIS — M6281 Muscle weakness (generalized): Secondary | ICD-10-CM | POA: Insufficient documentation

## 2010-12-22 DIAGNOSIS — M5442 Lumbago with sciatica, left side: Secondary | ICD-10-CM | POA: Insufficient documentation

## 2010-12-22 NOTE — Progress Notes (Cosign Needed)
  Patient Name: Cheryl Abbott MRN: [id number] Today's Date: 12/22/2010       Physical Therapy Treatment/Re-assessment Note  Time In: 3:55 Time Out: 4:36 Charge: Extremity/ Trunk MMT x 1 unit Extremity/Trunk ROM x 1 unit Functional assessment  Subjective: current: 5/10 L hip/LE radicular pain.  Best: 3/10, worst: 6-7/10 following long perios of standing/walking.  Pt side sleeps on R side only secondary to pain on L side.  Able to sit thru church but has to move around to reduce pain following the sit to stand.  Able to stand for 10 min max, long enough to fry some eggs, unable to complete housework activities for several days following.  Able to walk no longer than 15 minutes secondary to increase pain.  Has been performing HEP 1-2x a week.  It is easier to get in and out of bed.  Housework really no easier maybe 4%.  Has trouble lifting groceries out of car bringing into house.  Objective: MMT:  Muscle/Action Left now Left at initial eval Right Right at initial eval  Quad/ Knee extension 4/5 3/5 4+/5 3/5  Anterior tib/ Dorsiflexion 3+/5 2/5 4/5 3/5  Glut Med/ Abduction 4/5 3-/5 4-/5 3-/5  Hip flexion 4/5 3-/5 4/5 3-/5  Hamstrings/ knee flexion 3+/5 3/5 4/5 4/5  Glut max/ hip extension 3+/5 3-/5 4-/5 3-/5  Adduction 3/5 2+/5 3/5 2+/5   ROM: FB WNL (decreased by 30%) Extension 30 degrees WNL (decreased by 30%) SB L 11cm from middle finger to fibular head (decreased by 70%) SB R 9cm from middle finger to fibular head (decreased by 80%) Rotation L WNL ( decreased by 60%) Rotation R WNL (decreased by 60%)  5 STS in 17.5" with UE A  Exercises/Treatments: MMT B ROM measurement Subjective functional assessment  Assessment: Re-eval complete, see objective data for details.  Plan: Continue PT 2x a week for 3 more weeks to address remaining deficits.   Ihor Austin, PTA  [name] 12/22/2010, 5:03 PM

## 2010-12-26 ENCOUNTER — Ambulatory Visit (HOSPITAL_COMMUNITY)
Admission: RE | Admit: 2010-12-26 | Discharge: 2010-12-26 | Disposition: A | Discharge status: Home / Self Care | Payer: Medicare Other | Source: Ambulatory Visit | Attending: Family Medicine | Admitting: Family Medicine

## 2010-12-26 NOTE — Progress Notes (Signed)
Physical Therapy Treatment Patient Name: Cheryl Abbott [id number] Date: 12/26/2010  HPI: Pain Assessment Currently in Pain?: Yes Pain Score:   5 Pain Location: Back Pain Orientation: Left Pain Type: Chronic pain Pain Radiating Towards: to knee level Pain Onset: More than a month ago Pain Frequency: Constant Pain Relieving Factors: meds Effect of Pain on Daily Activities: increases Multiple Pain Sites: No  Precautions/Restrictions     Mobility (including Balance)     Patient ambulates with a shuffled gait need cuing to take longer steps.  L piriformis is tighter than the R   Exercise/Treatments Cervical Exercises Shoulder Extension: 10 reps;Theraband Row: 15 reps;Theraband Scapular Retraction: Theraband;15 reps Theraband Level (Scapular Retraction): Level 2 (Red);Level 3 (Green) Lumbar Stretches Piriformis Stretch: 3 reps;30 seconds Lumbar Exercises Scapular Retraction: Theraband;15 reps Theraband Level (Scapular Retraction): Level 2 (Red);Level 3 (Green) Row: 15 reps;Theraband Shoulder Extension: 10 reps;Theraband Stability Exercises Clam: 10 reps Bridge: 10 reps;Other (comment) (with legs on ball) Toe Tap: 10 reps Straight Leg Raise: 10 reps Hip Abduction: 10 reps;Side-lying;Weights (3#) Single Arm Raise: Prone;10 reps Leg Raise: Prone;10 reps Opposite Arm/Leg Raise: Prone;10 reps Knee Stretches Piriformis Stretch: 3 reps;30 seconds Modalities Modalities: Ultrasound Ultrasound Ultrasound Location: L sciatic area  Ultrasound Parameters: 1.0 mghz @ 1,5w/cm2 Ultrasound Goals: Pain  Goals PT Short Term Goals Short Term Goal 1 Progress: Partly met Short Term Goal 2 Progress: Progressing toward goal Short Term Goal 3 Progress: Progressing toward goal Short Term Goal 4 Progress: Progressing toward goal PT Long Term Goals Long Term Goal 1 Progress: Progressing toward goal Long Term Goal 2 Progress: Progressing toward goal Long Term Goal 3 Progress:  Progressing toward goal PT - End of Session Activity Tolerance: Patient limited by fatigue;Patient limited by pain General Behavior During Session: [facility] for tasks performed Cognition: [facility] for tasks performed PT Assessment and Plan Clinical Impression Statement: Patient needs verbal cuing to complete exercises properly as well as encouragement to complete exercises. Rehab Potential: Fair PT Frequency: Min 2X/week PT Duration: 4 weeks PT Treatment/Interventions: Therapeutic exercise;Patient/family education;Other (comment) (Ultra-sound) assess how ultrasound did for pt next treatment.  Work on improving gait.  RUSSELL,CINDY 12/26/2010, 4:35 PM

## 2010-12-26 NOTE — Patient Instructions (Signed)
To complete piriformis stretch on own.

## 2010-12-27 ENCOUNTER — Other Ambulatory Visit: Payer: Self-pay

## 2010-12-27 ENCOUNTER — Telehealth: Payer: Self-pay | Admitting: Family Medicine

## 2010-12-27 DIAGNOSIS — K219 Gastro-esophageal reflux disease without esophagitis: Secondary | ICD-10-CM

## 2010-12-27 MED ORDER — ALPRAZOLAM 0.5 MG PO TABS: 0.5000 mg | ORAL_TABLET | Freq: Two times a day (BID) | ORAL | Status: DC

## 2010-12-27 MED ORDER — OMEPRAZOLE 20 MG PO CPDR: 20.0000 mg | DELAYED_RELEASE_CAPSULE | Freq: Two times a day (BID) | ORAL | Status: DC

## 2010-12-27 MED ORDER — POTASSIUM CITRATE ER 5 MEQ (540 MG) PO TBCR: 5.0000 meq | EXTENDED_RELEASE_TABLET | Freq: Two times a day (BID) | ORAL | Status: DC

## 2010-12-27 NOTE — Telephone Encounter (Signed)
Sent in the patients meds and she said she was taking cipro for her uti but when the culture came back she was switched to nitrofuratoin and it made her so sick she had to go to the ER, and she received IV cipro. She said she felt better now and wasn't burning really anymore but I told her that if she felt like she needed to be seen she could call and make an appt to be seen to have her urine rechecked

## 2010-12-28 ENCOUNTER — Ambulatory Visit (HOSPITAL_COMMUNITY)
Admission: RE | Admit: 2010-12-28 | Discharge: 2010-12-28 | Disposition: A | Discharge status: Home / Self Care | Payer: Medicare Other | Source: Ambulatory Visit | Attending: Family Medicine | Admitting: Family Medicine

## 2010-12-28 ENCOUNTER — Ambulatory Visit: Payer: Medicare Other | Admitting: Family Medicine

## 2010-12-28 NOTE — Progress Notes (Signed)
Physical Therapy Treatment Patient Name: Cheryl Abbott [id number] Date: 12/28/2010  Re-eval date: 12/22/2010  (x3 wks) Charges: Therex 30'; Ultrasound 10'   HPI: Symptoms/Limitations Symptoms: 5/10 LHP with L hip radicular pain Pain Assessment Currently in Pain?: Yes Pain Score:   4 Pain Location: Back Pain Orientation: Lower Pain Type: Neuropathic pain Pain Radiating Towards: To left knee Pain Frequency: Constant   Exercise/Treatments Stability Exercises Clam: 10 reps;Supine Additional Hip Exercises Gait Training: Focusing on bigger steps and heel to toe gt pattern; around dept. Additional Knee Exercises Gait Training: Focusing on bigger steps and heel to toe gt pattern; around dept. Modalities Modalities: Ultrasound Ultrasound Ultrasound Location: [place] L3-5 area Ultrasound Parameters: 1.0 mHz @ 1.2 w/cm2 Ultrasound Goals: Pain Other therex unable to doc in doc flowsheets.  Squat with weighted ball forward(yellow) x10  Balance/bounce on blue theraball x2'  Seated on bosu on mat:   Pelvic tilts A/P R/L 10x each   Seated march 10x   Seated march with opp arm raise 10x      Goals PT Short Term Goals Short Term Goal 1 Progress: Partly met Short Term Goal 2 Progress: Progressing toward goal Short Term Goal 3 Progress: Progressing toward goal Short Term Goal 4 Progress: Progressing toward goal PT Long Term Goals Long Term Goal 1 Progress: Progressing toward goal Long Term Goal 2 Progress: Progressing toward goal Long Term Goal 3 Progress: Progressing toward goal Long Term Goal 4 Progress: Partly met End of Session Patient Active Problem List  Diagnoses  . 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 DIABETES MELLITUS, TYPE II  . Hyperlipidemia LDL goal < 70  . OBESITY, UNSPECIFIED  . ANEMIA  . ANXIETY DISORDER  . Hypertension goal BP (blood pressure) < 130/80  . HEMORRHOIDS  . GERD  . DYSPEPSIA  . FATTY LIVER DISEASE  . PYELONEPHRITIS  . RENAL CALCULUS    . ACUTE CYSTITIS  . BACK PAIN  . FOOT PAIN  . ORTHOSTATIC DIZZINESS  . SLEEP APNEA  . FATIGUE  . CHEST PAIN UNSPECIFIED  . NAUSEA AND VOMITING  . COLONIC POLYPS, ADENOMATOUS, HX OF  . UNSPECIFIED DERMATOMYCOSIS  . CERVICAL LYMPHADENITIS  . LIPOMA  . LYMPHADENOPATHY  . Hypercalcemia  . Nausea  . Muscle weakness (generalized)  . Thoracic or lumbosacral neuritis or radiculitis, unspecified   PT - End of Session Activity Tolerance: Patient limited by fatigue;Patient limited by pain General Behavior During Session: [facility] for tasks performed Cognition: [facility] for tasks performed PT Assessment and Plan Clinical Impression Statement: Pt requires VC/TC for posture control. Pt is easilty fatigued and requires frequent rest breaks. PT Treatment/Interventions: Therapeutic exercise;Other (comment) (ultrasound) PT Plan: Continue per PT POC. Continue to prgress core strength and decrease pain.  Rachelle Hora [facility] 12/28/2010, 5:18 PM

## 2010-12-29 ENCOUNTER — Ambulatory Visit (INDEPENDENT_AMBULATORY_CARE_PROVIDER_SITE_OTHER): Payer: Medicare Other | Admitting: Urology

## 2010-12-29 DIAGNOSIS — N200 Calculus of kidney: Secondary | ICD-10-CM

## 2010-12-29 DIAGNOSIS — N390 Urinary tract infection, site not specified: Secondary | ICD-10-CM

## 2011-01-02 ENCOUNTER — Ambulatory Visit (HOSPITAL_COMMUNITY): Payer: Medicare Other

## 2011-01-04 ENCOUNTER — Ambulatory Visit (HOSPITAL_COMMUNITY)
Admission: RE | Admit: 2011-01-04 | Discharge: 2011-01-04 | Disposition: A | Discharge status: Home / Self Care | Payer: Medicare Other | Source: Ambulatory Visit | Attending: Family Medicine | Admitting: Family Medicine

## 2011-01-04 NOTE — Patient Instructions (Signed)
Proper ambulation;

## 2011-01-04 NOTE — Progress Notes (Signed)
Physical Therapy Treatment Patient Name: CALISSE PICARDI [id number] Date: 01/04/2011  HPI:  Low back pain with L radicular symptoms                         Exercise x 49 min, HMP  Re-assess by 01/19/11                                                                    Symptoms/Limitations Symptoms: Patient states that she is doing her exercises on the bed Pain Assessment Pain Score:   4 Pain Location: Back Pain Orientation: Left Pain Type: Chronic pain Pain Onset: More than a month ago Pain Frequency: Constant Pain Relieving Factors: heating pad is better than ultrasound Effect of Pain on Daily Activities: increases Multiple Pain Sites: No  Precautions/Restrictions     Mobility (including Balance)       Exercise/Treatments Stability Exercises Clam: Supine;10 reps Bridge: Supine;15 reps Toe Tap: 10 reps Large Ball Abdominal Isometric: 10 reps Large Ball Oblique Isometric: 10 reps Straight Leg Raise: Supine (12 rep) Hip Abduction: 10 reps;Side-lying Single Arm Raise: Prone;10 reps Leg Raise: Prone;10 reps Opposite Arm/Leg Raise: Prone;10 reps Functional Squats: 20 reps (10 rep without anything/10 with yellow ball to practice Bodm) Modalities Modalities: Moist Heat Moist Heat Therapy Number Minutes Moist Heat: 15 Minutes Moist Heat Location: Other (comment) (back) Exercises not on doc flowsheet:  On Bosu ball on mat 10 hip flex, 10 pelvic rotation and 10 opposite arm/leg raise. Goals PT Short Term Goals Short Term Goal 1 Progress: Progressing toward goal Short Term Goal 2 Progress: Met Short Term Goal 3 Progress: Met Short Term Goal 4 Progress: Progressing toward goal PT Long Term Goals Long Term Goal 1 Progress: Progressing toward goal Long Term Goal 2 Progress: Progressing toward goal Long Term Goal 3 Progress: Progressing toward goal Long Term Goal 4 Progress: Progressing toward goal End of Session Patient Active Problem List  Diagnoses  . LIPOMA OF OTHER SKIN  AND SUBCUTANEOUS TISSUE  . DIABETES MELLITUS, TYPE II  . Hyperlipidemia LDL goal < 70  . OBESITY, UNSPECIFIED  . ANEMIA  . ANXIETY DISORDER  . Hypertension goal BP (blood pressure) < 130/80  . HEMORRHOIDS  . GERD  . DYSPEPSIA  . FATTY LIVER DISEASE  . PYELONEPHRITIS  . RENAL CALCULUS  . ACUTE CYSTITIS  . BACK PAIN  . FOOT PAIN  . ORTHOSTATIC DIZZINESS  . SLEEP APNEA  . FATIGUE  . CHEST PAIN UNSPECIFIED  . NAUSEA AND VOMITING  . COLONIC POLYPS, ADENOMATOUS, HX OF  . UNSPECIFIED DERMATOMYCOSIS  . CERVICAL LYMPHADENITIS  . LIPOMA  . LYMPHADENOPATHY  . Hypercalcemia  . Nausea  . Muscle weakness (generalized)  . Thoracic or lumbosacral neuritis or radiculitis, unspecified   PT - End of Session Activity Tolerance: Patient tolerated treatment well General Behavior During Session: [facility] for tasks performed Cognition: [facility] for tasks performed PT Assessment and Plan Rehab Potential: Good PT Frequency: Min 2X/week PT Duration: 4 weeks PT Treatment/Interventions: Therapeutic exercise;Other (comment) ([employer]) PT Plan: Pt did well with exercises.  Add 3# to abduction; begin plank on an incline  Abigale Dorow,CINDY 01/04/2011, 2:58 PM

## 2011-01-09 ENCOUNTER — Ambulatory Visit (HOSPITAL_COMMUNITY)
Admission: RE | Admit: 2011-01-09 | Discharge: 2011-01-09 | Disposition: A | Discharge status: Home / Self Care | Payer: Medicare Other | Source: Ambulatory Visit | Attending: Family Medicine | Admitting: Family Medicine

## 2011-01-09 NOTE — Progress Notes (Addendum)
Physical Therapy Treatment Patient Name: Cheryl Abbott [id number] Date: 01/09/2011  Time in: 1:06 Time out: 2:23 Re-assess: 01/19/11  Charges: Therex 50' MHPx 15'  HPI: Symptoms/Limitations Symptoms: Pt requests to have her blood pressure taken upon arrival to therapy secondary to feeling light headed. Pt reports ligh headedness subsided after resting. She reports has the same pain that's always in her low back. Pain Assessment Currently in Pain?: Yes Pain Score:   6 Pain Location: Back Pain Orientation: Lower Pain Type: Chronic pain   Exercise/Treatments  Warm up: Tread Mill: 5'@1 .0  Standing: Functional Squats: 20 reps Heel Raises: 15 reps Toe Raise: 15 reps   Seated: On physio ball- Marches x10 LAQ x10 Pelvic tilts A/P L/R x10 each   Supine: Clam: 15 reps;Supine Bridge: 10 reps;Supine Toe Tap: 10 reps Large Ball Abdominal Isometric: 10 reps;Supine Straight Leg Raise: Supine;15 reps  Side lying: Hip Abduction: 10 reps;Side-lying  Prone: Opposite Arm/Leg Raise: Prone;10 reps   Modalities Modalities: Moist Heat Moist Heat Therapy Number Minutes Moist Heat: 15 Minutes Moist Heat Location: Other (comment) (low back)  Goals PT Short Term Goals Short Term Goal 1: Independent in HEP Short Term Goal 1 Progress: Partly met Short Term Goal 2: Pain level to be decreased by 2 levels Short Term Goal 2 Progress: Partly met Short Term Goal 3: ROM to be improved by 20% to allow patient to have more ease picking items up off the floor. Short Term Goal 3 Progress: Met Short Term Goal 4: LTG 1#  Pain level to be decreased by 4 Short Term Goal 4 Progress: Progressing toward goal PT Long Term Goals Long Term Goal 1: Independent in advanced HEP Long Term Goal 1 Progress: Progressing toward goal Long Term Goal 2: Strength to be improved by 1 grade to allow patient to come from sit to stand without increased pain. Long Term Goal 2 Progress: Progressing toward  goal Long Term Goal 3: ROM to be improved by 40% to allow patient to reach the floor to pick up items without any difficulty. Long Term Goal 3 Progress: Progressing toward goal Long Term Goal 4: State that doing housework is 20% easier. Long Term Goal 4 Progress: Progressing toward goal End of Session Patient Active Problem List  Diagnoses  . 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 DIABETES MELLITUS, TYPE II  . Hyperlipidemia LDL goal < 70  . OBESITY, UNSPECIFIED  . ANEMIA  . ANXIETY DISORDER  . Hypertension goal BP (blood pressure) < 130/80  . HEMORRHOIDS  . GERD  . DYSPEPSIA  . FATTY LIVER DISEASE  . PYELONEPHRITIS  . RENAL CALCULUS  . ACUTE CYSTITIS  . BACK PAIN  . FOOT PAIN  . ORTHOSTATIC DIZZINESS  . SLEEP APNEA  . FATIGUE  . CHEST PAIN UNSPECIFIED  . NAUSEA AND VOMITING  . COLONIC POLYPS, ADENOMATOUS, HX OF  . UNSPECIFIED DERMATOMYCOSIS  . CERVICAL LYMPHADENITIS  . LIPOMA  . LYMPHADENOPATHY  . Hypercalcemia  . Nausea  . Muscle weakness (generalized)  . Thoracic or lumbosacral neuritis or radiculitis, unspecified   PT - End of Session Activity Tolerance: Patient tolerated treatment well General Behavior During Session: [facility] for tasks performed Cognition: [facility] for tasks performed PT Assessment and Plan Clinical Impression Statement: Pt with increased activity tolerance this tx. Pt c/o LBP with bridges which subsided with VC to engage core mm. Pt displays increased pelvic movement with pball activity. PT Treatment/Interventions: Therapeutic exercise;Other (comment) (MHP) PT Plan: Continue per PT POC. Add 3#  to abduction and begin plank on incline next tx.  Rachelle Hora Leah 01/09/2011, 2:15 PM

## 2011-01-11 ENCOUNTER — Ambulatory Visit (HOSPITAL_COMMUNITY)
Admission: RE | Admit: 2011-01-11 | Discharge: 2011-01-11 | Disposition: A | Discharge status: Home / Self Care | Payer: Medicare Other | Source: Ambulatory Visit | Attending: Family Medicine | Admitting: Family Medicine

## 2011-01-11 ENCOUNTER — Other Ambulatory Visit: Payer: Self-pay | Admitting: *Deleted

## 2011-01-11 DIAGNOSIS — E785 Hyperlipidemia, unspecified: Secondary | ICD-10-CM

## 2011-01-11 DIAGNOSIS — E119 Type 2 diabetes mellitus without complications: Secondary | ICD-10-CM | POA: Insufficient documentation

## 2011-01-11 DIAGNOSIS — R269 Unspecified abnormalities of gait and mobility: Secondary | ICD-10-CM | POA: Insufficient documentation

## 2011-01-11 DIAGNOSIS — IMO0001 Reserved for inherently not codable concepts without codable children: Secondary | ICD-10-CM | POA: Insufficient documentation

## 2011-01-11 DIAGNOSIS — I10 Essential (primary) hypertension: Secondary | ICD-10-CM | POA: Insufficient documentation

## 2011-01-11 DIAGNOSIS — M545 Low back pain, unspecified: Secondary | ICD-10-CM | POA: Insufficient documentation

## 2011-01-11 DIAGNOSIS — M6281 Muscle weakness (generalized): Secondary | ICD-10-CM | POA: Insufficient documentation

## 2011-01-11 MED ORDER — LOVASTATIN 40 MG PO TABS: 40.0000 mg | ORAL_TABLET | Freq: Every day | ORAL | Status: DC

## 2011-01-11 NOTE — Progress Notes (Signed)
Physical Therapy Treatment Patient Name: Cheryl Abbott [id number] Date: 01/11/2011  Time in: 1:05pm Time out: 2:00pm Visits #:  Charges: therex 39' , MHP 20' Due for Re-eval 01/21/11  HPI: Symptoms/Limitations Symptoms: Pt reports her back was [place] until grocery shopping and sweeping the floor Pain Assessment Currently in Pain?: Yes Pain Score:   4 Pain Location: Back  Precautions/Restrictions :None noted      OBJECTIVE: Exercise/Treatments Warm up:  Tread Mill: 5'@1 .0  Standing:  Functional Squats: 20 reps  Heel Raises: 20 reps  Toe Raise: 20 reps  Seated:  On physioball -  marching 5X each    LAQ 5X  each Supine:  Clam: 15 reps;Supine  Bridge: 10 reps;Supine  Toe Tap: 10 reps  Large Ball Abdominal Isometric: 10 reps;Supine  Straight Leg Raise: Supine;15 reps  Side lying:  Hip Abduction: 15 reps;Side-lying  Prone:  Opposite Arm/Leg Raise: Prone;10 reps    Modalities Modalities: Moist Heat Moist Heat Therapy Number Minutes Moist Heat: 20 Minutes Moist Heat Location: Other (comment) (low back)   End of Session  PT - End of Session Activity Tolerance: Patient tolerated treatment well General Behavior During Session: [facility] for tasks performed Cognition: [facility] for tasks performed PT Assessment and Plan Clinical Impression Statement: pt. with difficulty stabilizing with ball activities with poor eccentric control of movements despite VC's. PT Treatment/Interventions: Therapeutic exercise;Other (comment) (Moist Heat) PT Plan: Add 3# to abduction and begin plank on incline next treatment.  Mare Ferrari, Amy B 01/11/2011, 1:46 PM

## 2011-01-12 ENCOUNTER — Encounter (HOSPITAL_COMMUNITY)
Admission: RE | Admit: 2011-01-12 | Discharge: 2011-01-12 | Disposition: A | Discharge status: Home / Self Care | Payer: Medicare Other | Source: Ambulatory Visit | Attending: "Endocrinology | Admitting: "Endocrinology

## 2011-01-12 ENCOUNTER — Encounter (HOSPITAL_COMMUNITY): Payer: Self-pay

## 2011-01-12 DIAGNOSIS — E215 Disorder of parathyroid gland, unspecified: Secondary | ICD-10-CM | POA: Insufficient documentation

## 2011-01-12 MED ORDER — TECHNETIUM TC 99M SESTAMIBI - CARDIOLITE
25.0000 | Freq: Once | INTRAVENOUS | Status: AC | PRN
Start: 2011-01-12 — End: 2011-01-12
  Administered 2011-01-12: 24.5 via INTRAVENOUS

## 2011-01-16 ENCOUNTER — Ambulatory Visit (HOSPITAL_COMMUNITY): Payer: Medicare Other

## 2011-01-17 ENCOUNTER — Ambulatory Visit (HOSPITAL_COMMUNITY)
Admission: RE | Admit: 2011-01-17 | Discharge: 2011-01-17 | Disposition: A | Discharge status: Home / Self Care | Payer: Medicare Other | Source: Ambulatory Visit | Attending: Physical Therapy | Admitting: Physical Therapy

## 2011-01-17 DIAGNOSIS — IMO0002 Reserved for concepts with insufficient information to code with codable children: Secondary | ICD-10-CM

## 2011-01-17 DIAGNOSIS — M6281 Muscle weakness (generalized): Secondary | ICD-10-CM

## 2011-01-17 NOTE — Patient Instructions (Addendum)
Pt must exercise at home.

## 2011-01-17 NOTE — Progress Notes (Signed)
Physical Therapy Evaluation  Patient Name: Cheryl Abbott [id number] Date: 01/17/2011 HPI: Low back pain Symptoms/Limitations Symptoms: Pt states her back pain in min today, her BP is normal but sugar is in the 300s today. Pain Assessment Currently in Pain?: No/denies Pain Location: Back Past Medical History:  Past Medical History  Diagnosis Date  . Anxiety disorder   . Back pain   . Diabetes mellitus type 1   . Hyperlipidemia   . Hypertension   . Sleep apnea   . Disease of vein   . Renal insufficiency    Past Surgical History:  Past Surgical History  Procedure Date  . Partial hysterectomy   . Extraction of left kidney stone   . Lithotryspy and stent replacement rt ureter 10/2004    12/2009 Dr, Michela Pitcher   . Removal of lump from posterior r thigh   . Bilateral stone removal with nephrostomy at [facility] hospital October 2011    Precautions/Restrictions   none Prior Functioning - Chronic LBP  Cognition alert and oriented.   Sensation/Coordination/Flexibility    Assessment RLE Strength Right Hip Flexion: 3+/5 Right Hip Extension: 3+/5 Right Hip ABduction: 3/5 Right Hip ADduction: 3/5 Right Knee Flexion: 4/5 Right Knee Extension: 5/5 Right Ankle Dorsiflexion: 5/5 LLE Strength Left Hip Flexion: 3/5 Left Hip Extension: 3+/5 Left Hip ABduction: 3/5 Left Hip ADduction: 3/5 Left Knee Flexion:  (4-) Left Knee Extension: 4/5 Left Ankle Dorsiflexion: 4/5 Lumbar Assessment Lumbar Assessment: Exceptions to [facility] Lumbar AROM Lumbar Flexion: wnl Lumbar Extension: wnl Lumbar - Right Side Bend: wnl Lumbar - Left Side Bend: wnl Lumbar - Right Rotation: decreased 20% Lumbar - Left Rotation: decreased 20%.  Knee extension/AT has improved B from last treatment.  L hamstring has improved 1/2 grade; Hip extension/abduction and adduction have remained the same. Pt ROM Has improved but subjective pain is the same.   Pt to be discharge to HEP at next treatment with HEP focusing on  hip extension, ab and adduction.    Long term goals Met:  ROM wnl; Pt completing HEP,(pt admits she knows her HEP but is not doing them very often); Pt states housework is easier although very fatiguing;  Goals not met :  Strength is not 4/5 ; pain level has not changed.   Pt counseled on the importance of increasing overall strength,(weak mm one of the best indicators of admission to nursing homes), and walking.   Mobility (including Balance)   Pt has decreased balance.    Exercise/Treatments Lumbar Exercises Scapular Retraction: Strengthening;15 reps;Seated;Theraband Row: 15 reps;Seated;Theraband Shoulder Extension: 15 reps;Strengthening;seated;Theraband Stability Exercises Single Arm Raise: 15 reps;Prone Leg Raise: 15 reps;Prone Opposite Arm/Leg Raise: Prone;15 reps Functional Squats: 10 reps (2 sets of 10) Heel Raises: 20 reps Lumbar Machine Exercises Tread Mill: 5' at 1.4  Heel Raises: 20 reps Ankle Exercises Heel Raises: 20 reps Toe Raise: 20 reps    Goals PT Short Term Goals Short Term Goal 1: Does exercises 2x week Short Term Goal 2 Progress: Not met Short Term Goal 3 Progress: Met Short Term Goal 4 Progress: Not met PT Long Term Goals Long Term Goal 1 Progress: Partly met Long Term Goal 2 Progress: Partly met Long Term Goal 3 Progress: Met Long Term Goal 4 Progress: Met End of Session Patient Active Problem List  Diagnoses  . 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 DIABETES MELLITUS, TYPE II  . Hyperlipidemia LDL goal < 70  . OBESITY, UNSPECIFIED  . ANEMIA  . ANXIETY DISORDER  .  Hypertension goal BP (blood pressure) < 130/80  . HEMORRHOIDS  . GERD  . DYSPEPSIA  . FATTY LIVER DISEASE  . PYELONEPHRITIS  . RENAL CALCULUS  . ACUTE CYSTITIS  . BACK PAIN  . FOOT PAIN  . ORTHOSTATIC DIZZINESS  . SLEEP APNEA  . FATIGUE  . CHEST PAIN UNSPECIFIED  . NAUSEA AND VOMITING  . COLONIC POLYPS, ADENOMATOUS, HX OF  . UNSPECIFIED DERMATOMYCOSIS  .  CERVICAL LYMPHADENITIS  . LIPOMA  . LYMPHADENOPATHY  . Hypercalcemia  . Nausea  . Muscle weakness (generalized)  . Thoracic or lumbosacral neuritis or radiculitis, unspecified   PT - End of Session Activity Tolerance: Patient tolerated treatment well General Behavior During Session: [facility] for tasks performed Cognition: [facility] for tasks performed PT Assessment and Plan Rehab Potential: Fair PT Frequency:  (one more treatment on Fri)  Time Calculation Start Time: [id number] Stop Time: 1555 Time Calculation (min): 58 min  Cheryl Abbott,Cheryl Abbott 01/17/2011, 3:57 PM

## 2011-01-18 ENCOUNTER — Ambulatory Visit (HOSPITAL_COMMUNITY): Payer: Medicare Other | Admitting: Physical Therapy

## 2011-01-18 ENCOUNTER — Ambulatory Visit (HOSPITAL_COMMUNITY): Payer: Medicare Other

## 2011-01-19 ENCOUNTER — Other Ambulatory Visit: Payer: Self-pay | Admitting: Family Medicine

## 2011-01-22 ENCOUNTER — Ambulatory Visit (HOSPITAL_COMMUNITY)
Admission: RE | Admit: 2011-01-22 | Discharge: 2011-01-22 | Disposition: A | Discharge status: Home / Self Care | Payer: Medicare Other | Source: Ambulatory Visit

## 2011-01-22 DIAGNOSIS — IMO0002 Reserved for concepts with insufficient information to code with codable children: Secondary | ICD-10-CM

## 2011-01-22 DIAGNOSIS — M6281 Muscle weakness (generalized): Secondary | ICD-10-CM

## 2011-01-22 NOTE — Progress Notes (Signed)
Physical Therapy Treatment Patient Name: Cheryl Abbott [id number] Date: 01/22/2011  Time In: 2:42 Time Out: 3:40 Visit #:  Next Re-eval: today last tx. Charge: therex 38 min Gait 5 min Heat x 60min  Subjective: Symptoms/Limitations Symptoms: 3/10 LBP, sugar level is 117 today Pain Assessment Currently in Pain?: Yes Pain Score:   3 Pain Location: Back Pain Orientation: Lower Pain Type: Chronic pain Pain Onset: More than a month ago Pain Frequency: Constant  Objective:  BP 144/70 HR 118 bpm  Exercise/Treatments  Tread Mill: 5' at 1.4   Standing: Scapular Retraction: Strengthening;15 reps;Seated;Theraband  Row: 15 reps;Seated;Theraband  Shoulder Extension: 15 reps;Strengthening;seated;Theraband  Heel Raises: 20 reps  Toe Raise: 20 reps  Prone Single Arm Raise: 15 reps;Prone  Leg Raise: 15 reps;Prone  Opposite Arm/Leg Raise: Prone;15 reps  Heel squeeze 10 x 5"  Goals   End of Session Patient Active Problem List  Diagnoses  . 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 DIABETES MELLITUS, TYPE II  . Hyperlipidemia LDL goal < 70  . OBESITY, UNSPECIFIED  . ANEMIA  . ANXIETY DISORDER  . Hypertension goal BP (blood pressure) < 130/80  . HEMORRHOIDS  . GERD  . DYSPEPSIA  . FATTY LIVER DISEASE  . PYELONEPHRITIS  . RENAL CALCULUS  . ACUTE CYSTITIS  . BACK PAIN  . FOOT PAIN  . ORTHOSTATIC DIZZINESS  . SLEEP APNEA  . FATIGUE  . CHEST PAIN UNSPECIFIED  . NAUSEA AND VOMITING  . COLONIC POLYPS, ADENOMATOUS, HX OF  . UNSPECIFIED DERMATOMYCOSIS  . CERVICAL LYMPHADENITIS  . LIPOMA  . LYMPHADENOPATHY  . Hypercalcemia  . Nausea  . Muscle weakness (generalized)  . Thoracic or lumbosacral neuritis or radiculitis, unspecified   PT - End of Session Activity Tolerance: Patient tolerated treatment well General Behavior During Session: [facility] for tasks performed Cognition: [facility] for tasks performed PT Assessment and Plan Clinical Impression Statement: Pt with  c/o chest pain underneath sternum during session.  Pt stated it had been like that during weekend and still continues.  Pt encouraged to make an appt with MD or go to ER.  BP 144/70, HR 118.  Pt with instabiility shown during therex. PT Plan: D/C to HEP per PT plan reassessment complete on 01/17/2011.  [name] 01/22/2011, 5:09 PM

## 2011-01-26 ENCOUNTER — Ambulatory Visit (INDEPENDENT_AMBULATORY_CARE_PROVIDER_SITE_OTHER): Payer: Medicare Other | Admitting: Urology

## 2011-01-26 DIAGNOSIS — N200 Calculus of kidney: Secondary | ICD-10-CM

## 2011-01-26 DIAGNOSIS — N390 Urinary tract infection, site not specified: Secondary | ICD-10-CM

## 2011-01-30 ENCOUNTER — Telehealth: Payer: Self-pay | Admitting: Family Medicine

## 2011-01-30 NOTE — Telephone Encounter (Signed)
Please have patient come in for ov

## 2011-01-31 ENCOUNTER — Ambulatory Visit: Payer: Medicare Other | Admitting: Family Medicine

## 2011-01-31 ENCOUNTER — Encounter: Payer: Self-pay | Admitting: Family Medicine

## 2011-02-15 ENCOUNTER — Emergency Department (HOSPITAL_COMMUNITY): Payer: Medicare Other

## 2011-02-15 ENCOUNTER — Emergency Department (HOSPITAL_COMMUNITY)
Admission: EM | Admit: 2011-02-15 | Discharge: 2011-02-15 | Disposition: A | Discharge status: Home / Self Care | Payer: Medicare Other | Attending: Emergency Medicine | Admitting: Emergency Medicine

## 2011-02-15 ENCOUNTER — Encounter (HOSPITAL_COMMUNITY): Payer: Self-pay | Admitting: *Deleted

## 2011-02-15 ENCOUNTER — Other Ambulatory Visit: Payer: Self-pay

## 2011-02-15 DIAGNOSIS — J069 Acute upper respiratory infection, unspecified: Secondary | ICD-10-CM | POA: Insufficient documentation

## 2011-02-15 DIAGNOSIS — I10 Essential (primary) hypertension: Secondary | ICD-10-CM | POA: Insufficient documentation

## 2011-02-15 DIAGNOSIS — F411 Generalized anxiety disorder: Secondary | ICD-10-CM | POA: Insufficient documentation

## 2011-02-15 DIAGNOSIS — E119 Type 2 diabetes mellitus without complications: Secondary | ICD-10-CM | POA: Insufficient documentation

## 2011-02-15 DIAGNOSIS — E785 Hyperlipidemia, unspecified: Secondary | ICD-10-CM | POA: Insufficient documentation

## 2011-02-15 LAB — GLUCOSE, CAPILLARY: Glucose-Capillary: 94 mg/dL (ref 70–99)

## 2011-02-15 MED ORDER — IBUPROFEN 800 MG PO TABS
800.0000 mg | ORAL_TABLET | Freq: Once | ORAL | Status: AC
  Administered 2011-02-15: 800 mg via ORAL
  Filled 2011-02-15: qty 1

## 2011-02-15 MED ORDER — ONDANSETRON 8 MG PO TBDP
8.0000 mg | ORAL_TABLET | Freq: Once | ORAL | Status: AC
  Administered 2011-02-15: 8 mg via ORAL
  Filled 2011-02-15: qty 1

## 2011-02-15 MED ORDER — PREDNISONE 20 MG PO TABS: 60.0000 mg | ORAL_TABLET | Freq: Once | ORAL | Status: DC

## 2011-02-15 NOTE — ED Notes (Signed)
Lung sounds clear 

## 2011-02-15 NOTE — ED Provider Notes (Signed)
Scribed for Dr. Christy Gentles, the patient was seen in room 14. This chart was scribed by Hortense Ramal. This patient's care was started at 20:30.   History   CSN: [id number] Arrival date & time: 02/15/2011  7:48 PM  Chief Complaint  Patient presents with  . Emesis   Patient is a 65 y.o. female presenting with vomiting. The history is provided by the patient.  Emesis  Pertinent negatives include no abdominal pain, no chills, no diarrhea and no fever.   Cheryl Abbott is a 65 y.o. female who presents to the Emergency Department complaining of sore throat for one week worse with swallowing and with associated raspy voice. She describes one week of symptoms, now with nausea, vomiting, diarrhea, cough with sputum, DOE, measured 99.8 temperature at home. She reports a medical history positive for DM, HTN. She took ASA 81mg  PO prior to arrival.   Past Medical History  Diagnosis Date  . Anxiety disorder   . Back pain   . Diabetes mellitus type 1   . Hyperlipidemia   . Hypertension   . Sleep apnea   . Disease of vein   . Renal insufficiency     Past Surgical History  Procedure Date  . Partial hysterectomy   . Extraction of left kidney stone   . Lithotryspy and stent replacement rt ureter 10/2004    12/2009 Dr, Michela Pitcher   . Removal of lump from posterior r thigh   . Bilateral stone removal with nephrostomy at [facility] hospital October 2011    Family History  Problem Relation Age of Onset  . Hypertension Mother   . Diabetes Mother   . Heart failure Mother   . Dementia Mother   . Emphysema Father   . Hypertension Father   . Diabetes Brother   . GER disease Brother   . Cancer      family history   . Diabetes      family history   . Heart defect      famiily history   . Arthritis      family history     History  Substance Use Topics  . Smoking status: Never Smoker   . Smokeless tobacco: Not on file  . Alcohol Use: No    Review of Systems  Constitutional:  Negative for fever and chills.  HENT: Positive for sore throat and voice change. Negative for hearing loss, ear pain, trouble swallowing, neck pain and neck stiffness.   Eyes: Negative for visual disturbance.  Respiratory: Positive for shortness of breath (with exertion only).   Cardiovascular: Negative for chest pain.  Gastrointestinal: Positive for vomiting. Negative for abdominal pain and diarrhea.  Neurological: Negative for weakness and light-headedness.  Psychiatric/Behavioral: Negative for confusion.  All other systems reviewed and are negative.    Physical Exam  BP 111/73  Pulse 98  Temp(Src) 98.6 F (37 C) (Oral)  Resp 18  Wt 201 lb (91.173 kg)  SpO2 98%  Physical Exam  Nursing note and vitals reviewed. CONSTITUTIONAL: Well developed/well nourished HEAD AND FACE: Normocephalic/atraumatic EYES: EOMI/PERRL ENMT: Mucous membranes moist, no pharyngeal erythema or tonsilar exudates No stridor.  Voice is mildly hoarse.   NECK: supple no meningeal signs CV: S1/S2 noted, no murmurs/rubs/gallops noted LUNGS: Lungs are clear to auscultation bilaterally, no apparent distress ABDOMEN: soft, nontender, no rebound or guarding NEURO: Pt is awake/alert, moves all extremitiesx4 EXTREMITIES: pulses normal, full ROM SKIN: warm, color normal PSYCH: no abnormalities of mood noted   ED Course  Procedures  DIAGNOSTIC STUDIES: Oxygen Saturation is 98% on room air, normal by my interpretation.     Date: 02/15/2011  Rate: 91  Rhythm: normal sinus rhythm  QRS Axis: normal  Intervals: normal  ST/T Wave abnormalities: nonspecific ST changes  Conduction Disutrbances:none  Narrative Interpretation:   Old EKG Reviewed: unchanged    LABS / RADIOLOGY: xrays reviewed and considered     ED COURSE / COORDINATION OF CARE: Pt well appearing.  Xray performed due to h/o sore throat for a week and h/o DM, but suspicion for deep space infection in neck is low.  She is well appearing,  taking PO.  Likely viral process.  I offered dose of prednisone for sore throat/cough but she refused.      CONDITION ON DISCHARGE: Good  MEDICATIONS GIVEN IN THE E.D.  Medications  amLODipine (NORVASC) 5 MG tablet (not administered)  cephALEXin (KEFLEX) 500 MG capsule (not administered)  omeprazole (PRILOSEC) 20 MG capsule (not administered)  ALPRAZolam (XANAX) 0.5 MG tablet (not administered)    DISCHARGE MEDICATIONS: New Prescriptions   No medications on file    SCRIBE ATTESTATION: I personally performed the services described in this documentation, which was scribed in my presence. The recorded information has been reviewed and considered. Sharyon Cable, MD       Sharyon Cable, MD 02/15/11 [phone]

## 2011-02-15 NOTE — ED Notes (Signed)
Nausea and vomiting, pain in chest when coughing for over a week, also has sore throat

## 2011-02-20 ENCOUNTER — Encounter: Payer: Self-pay | Admitting: Family Medicine

## 2011-02-21 ENCOUNTER — Ambulatory Visit (INDEPENDENT_AMBULATORY_CARE_PROVIDER_SITE_OTHER): Payer: Medicare Other | Admitting: Family Medicine

## 2011-02-21 ENCOUNTER — Encounter: Payer: Self-pay | Admitting: Family Medicine

## 2011-02-21 VITALS — BP 110/70 | HR 106 | Resp 16 | Ht 67.0 in | Wt 199.0 lb

## 2011-02-21 DIAGNOSIS — H109 Unspecified conjunctivitis: Secondary | ICD-10-CM

## 2011-02-21 DIAGNOSIS — I10 Essential (primary) hypertension: Secondary | ICD-10-CM

## 2011-02-21 DIAGNOSIS — E119 Type 2 diabetes mellitus without complications: Secondary | ICD-10-CM

## 2011-02-21 DIAGNOSIS — D649 Anemia, unspecified: Secondary | ICD-10-CM

## 2011-02-21 DIAGNOSIS — F411 Generalized anxiety disorder: Secondary | ICD-10-CM

## 2011-02-21 DIAGNOSIS — E785 Hyperlipidemia, unspecified: Secondary | ICD-10-CM

## 2011-02-21 NOTE — Assessment & Plan Note (Signed)
Improved, based on pt history, will still refer for opthalmology eval , and her diabetic eye exam is due reportedly

## 2011-02-21 NOTE — Assessment & Plan Note (Signed)
Low fat diet discussed and encouraged 

## 2011-02-21 NOTE — Assessment & Plan Note (Signed)
Controlled, no change in medication  

## 2011-02-21 NOTE — Progress Notes (Signed)
  Subjective:    Patient ID: Cheryl Abbott, female    DOB: 05/27/1946, 65 y.o.   MRN: [id number]  HPI Pt  In for f/u acute conjunctivitis, and pharyngitis, which was treated at [place] 3 days ago. States she had been to [facility] 2 days before, steroids were prescribed and she refused this and eneded going by ambulance to another ED  2 days after. Still has redness in the eyes, drainage is gone, throat is no longer sore. Treated with z pack and moxifloxacin eye drops. Much better, but needs opthal f/u, 2 week h/o vomiting daily however this has resolved  since getting med x 1 dose in  the ED . Never had any  diarreah   Review of Systems See HPI Denies recent fever or chills. Denies sinus pressure, nasal congestion, ear pain or sore throat. Denies chest congestion, productive cough or wheezing. Denies chest pains, palpitations and leg swelling Denies abdominal pain, nausea, vomiting,diarrhea or constipation.   Denies dysuria, frequency, hesitancy or incontinence. Denies joint pain, swelling and limitation in mobility. Denies headaches, seizures, numbness, or tingling. Denies depression, anxiety or insomnia. Denies skin break down or rash.        Objective:   Physical Exam Patient alert and oriented and in no cardiopulmonary distress.  HEENT: No facial asymmetry, EOMI, no sinus tenderness,  oropharynx pink and moist.  Neck supple no adenopathy.Bilateral conjunctival injection, left greater than right, no drainage noted  Chest: Clear to auscultation bilaterally.  CVS: S1, S2 no murmurs, no S3.  ABD: Soft non tender. Bowel sounds normal.  Ext: No edema  MS: Adequate ROM spine, shoulders, hips and knees.  Skin: Intact, no ulcerations or rash noted.  Psych: Good eye contact, normal affect. Memory intact not anxious or depressed appearing.  CNS: CN 2-12 intact, power, tone and sensation normal throughout.        Assessment & Plan:

## 2011-02-21 NOTE — Patient Instructions (Signed)
F/U in 3.5 months. Cancel any sooner appt  HBA1C and cBC and diff today.  Fasting lipid, cmp and EGFR and hBA1C in 3.5 months  LABWORK  NEEDS TO BE DONE BETWEEN 3 TO 7 DAYS BEFORE YOUR NEXT SCEDULED  VISIT.  THIS WILL IMPROVE THE QUALITY OF YOUR CARE.  You are being referred for eye exam and your mammogram.

## 2011-02-22 LAB — CBC WITH DIFFERENTIAL/PLATELET
Basophils Absolute: 0 K/uL (ref 0.0–0.1)
Basophils Relative: 0 % (ref 0–1)
Eosinophils Absolute: 0.3 K/uL (ref 0.0–0.7)
Eosinophils Relative: 3 % (ref 0–5)
HCT: 31.9 % — ABNORMAL LOW (ref 36.0–46.0)
Hemoglobin: 9.7 g/dL — ABNORMAL LOW (ref 12.0–15.0)
Lymphocytes Relative: 28 % (ref 12–46)
Lymphs Abs: 2.7 K/uL (ref 0.7–4.0)
MCH: 22.7 pg — ABNORMAL LOW (ref 26.0–34.0)
MCHC: 30.4 g/dL (ref 30.0–36.0)
MCV: 74.5 fL — ABNORMAL LOW (ref 78.0–100.0)
Monocytes Absolute: 1 K/uL (ref 0.1–1.0)
Monocytes Relative: 11 % (ref 3–12)
Neutro Abs: 5.5 K/uL (ref 1.7–7.7)
Neutrophils Relative %: 58 % (ref 43–77)
Platelets: 506 K/uL — ABNORMAL HIGH (ref 150–400)
RBC: 4.28 MIL/uL (ref 3.87–5.11)
RDW: 19 % — ABNORMAL HIGH (ref 11.5–15.5)
WBC: 9.5 K/uL (ref 4.0–10.5)

## 2011-02-22 LAB — HEMOGLOBIN A1C
Hgb A1c MFr Bld: 6.5 % — ABNORMAL HIGH
Mean Plasma Glucose: 140 mg/dL — ABNORMAL HIGH

## 2011-02-23 ENCOUNTER — Telehealth: Payer: Self-pay | Admitting: *Deleted

## 2011-02-23 NOTE — Telephone Encounter (Signed)
Patient aware of lab results.

## 2011-02-23 NOTE — Telephone Encounter (Signed)
Patient aware.

## 2011-02-23 NOTE — Telephone Encounter (Signed)
Message copied by Christia Reading on Fri Feb 23, 2011  8:53 AM ------      Message from: Tula Nakayama MD E      Created: Thu Feb 22, 2011  5:45 AM       pls advise pt her white cell count is normal, good

## 2011-02-23 NOTE — Telephone Encounter (Signed)
Message copied by Christia Reading on Fri Feb 23, 2011  3:33 PM ------      Message from: Tula Nakayama MD E      Created: Thu Feb 22, 2011  5:45 AM       pls advise pt her white cell count is normal, good

## 2011-02-23 NOTE — Telephone Encounter (Signed)
Called patient, no answer 

## 2011-03-04 ENCOUNTER — Emergency Department (HOSPITAL_COMMUNITY)
Admission: EM | Admit: 2011-03-04 | Discharge: 2011-03-04 | Disposition: A | Discharge status: Home / Self Care | Payer: Medicare Other | Attending: Emergency Medicine | Admitting: Emergency Medicine

## 2011-03-04 ENCOUNTER — Encounter (HOSPITAL_COMMUNITY): Payer: Self-pay | Admitting: *Deleted

## 2011-03-04 DIAGNOSIS — R1011 Right upper quadrant pain: Secondary | ICD-10-CM | POA: Insufficient documentation

## 2011-03-04 DIAGNOSIS — IMO0001 Reserved for inherently not codable concepts without codable children: Secondary | ICD-10-CM | POA: Insufficient documentation

## 2011-03-04 DIAGNOSIS — R110 Nausea: Secondary | ICD-10-CM | POA: Insufficient documentation

## 2011-03-04 DIAGNOSIS — Z79899 Other long term (current) drug therapy: Secondary | ICD-10-CM | POA: Insufficient documentation

## 2011-03-04 DIAGNOSIS — E785 Hyperlipidemia, unspecified: Secondary | ICD-10-CM | POA: Insufficient documentation

## 2011-03-04 DIAGNOSIS — J029 Acute pharyngitis, unspecified: Secondary | ICD-10-CM | POA: Insufficient documentation

## 2011-03-04 DIAGNOSIS — F411 Generalized anxiety disorder: Secondary | ICD-10-CM | POA: Insufficient documentation

## 2011-03-04 DIAGNOSIS — E109 Type 1 diabetes mellitus without complications: Secondary | ICD-10-CM | POA: Insufficient documentation

## 2011-03-04 DIAGNOSIS — M7918 Myalgia, other site: Secondary | ICD-10-CM

## 2011-03-04 DIAGNOSIS — I10 Essential (primary) hypertension: Secondary | ICD-10-CM | POA: Insufficient documentation

## 2011-03-04 LAB — URINALYSIS, ROUTINE W REFLEX MICROSCOPIC
Bilirubin Urine: NEGATIVE
Glucose, UA: NEGATIVE mg/dL
Hgb urine dipstick: NEGATIVE
Ketones, ur: NEGATIVE mg/dL
Nitrite: NEGATIVE
Protein, ur: NEGATIVE mg/dL
Specific Gravity, Urine: 1.02 (ref 1.005–1.030)
Urobilinogen, UA: 0.2 mg/dL (ref 0.0–1.0)
pH: 6 (ref 5.0–8.0)

## 2011-03-04 LAB — COMPREHENSIVE METABOLIC PANEL
ALT: 12 U/L (ref 0–35)
AST: 15 U/L (ref 0–37)
Albumin: 3.8 g/dL (ref 3.5–5.2)
Alkaline Phosphatase: 78 U/L (ref 39–117)
BUN: 19 mg/dL (ref 6–23)
CO2: 26 mEq/L (ref 19–32)
Calcium: 11 mg/dL — ABNORMAL HIGH (ref 8.4–10.5)
Chloride: 91 mEq/L — ABNORMAL LOW (ref 96–112)
Creatinine, Ser: 1.26 mg/dL — ABNORMAL HIGH (ref 0.50–1.10)
GFR calc Af Amer: 51 mL/min — ABNORMAL LOW (ref >60)
GFR calc non Af Amer: 42 mL/min — ABNORMAL LOW (ref >60)
Glucose, Bld: 101 mg/dL — ABNORMAL HIGH (ref 70–99)
Potassium: 3.4 mEq/L — ABNORMAL LOW (ref 3.5–5.1)
Sodium: 133 mEq/L — ABNORMAL LOW (ref 135–145)
Total Bilirubin: 0.1 mg/dL — ABNORMAL LOW (ref 0.3–1.2)
Total Protein: 8.5 g/dL — ABNORMAL HIGH (ref 6.0–8.3)

## 2011-03-04 LAB — DIFFERENTIAL
Basophils Absolute: 0 10*3/uL (ref 0.0–0.1)
Basophils Relative: 0 % (ref 0–1)
Eosinophils Absolute: 0.1 10*3/uL (ref 0.0–0.7)
Eosinophils Relative: 0 % (ref 0–5)
Lymphocytes Relative: 17 % (ref 12–46)
Lymphs Abs: 2 10*3/uL (ref 0.7–4.0)
Monocytes Absolute: 1.1 10*3/uL — ABNORMAL HIGH (ref 0.1–1.0)
Monocytes Relative: 9 % (ref 3–12)
Neutro Abs: 8.9 10*3/uL — ABNORMAL HIGH (ref 1.7–7.7)
Neutrophils Relative %: 74 % (ref 43–77)

## 2011-03-04 LAB — RAPID STREP SCREEN (MED CTR MEBANE ONLY): Streptococcus, Group A Screen (Direct): NEGATIVE

## 2011-03-04 LAB — CBC
HCT: 33.3 % — ABNORMAL LOW (ref 36.0–46.0)
Hemoglobin: 10.1 g/dL — ABNORMAL LOW (ref 12.0–15.0)
MCH: 22.3 pg — ABNORMAL LOW (ref 26.0–34.0)
MCHC: 30.3 g/dL (ref 30.0–36.0)
MCV: 73.7 fL — ABNORMAL LOW (ref 78.0–100.0)
Platelets: 484 10*3/uL — ABNORMAL HIGH (ref 150–400)
RBC: 4.52 MIL/uL (ref 3.87–5.11)
RDW: 18.4 % — ABNORMAL HIGH (ref 11.5–15.5)
WBC: 12.1 10*3/uL — ABNORMAL HIGH (ref 4.0–10.5)

## 2011-03-04 LAB — LIPASE, BLOOD: Lipase: 43 U/L (ref 11–59)

## 2011-03-04 LAB — URINE MICROSCOPIC-ADD ON

## 2011-03-04 MED ORDER — DIPHENHYDRAMINE HCL 12.5 MG/5ML PO ELIX
25.0000 mg | ORAL_SOLUTION | Freq: Once | ORAL | Status: AC
  Administered 2011-03-04: 25 mg via ORAL
  Filled 2011-03-04 (×2): qty 5

## 2011-03-04 MED ORDER — IBUPROFEN 600 MG PO TABS: 600.0000 mg | ORAL_TABLET | Freq: Three times a day (TID) | ORAL | Status: AC | PRN

## 2011-03-04 MED ORDER — DIPHENHYDRAMINE HCL 12.5 MG/5ML PO ELIX: 25.0000 mg | ORAL_SOLUTION | Freq: Four times a day (QID) | ORAL | Status: AC | PRN

## 2011-03-04 NOTE — ED Notes (Signed)
Benadryl given as ordered--Tolerated well but c/o severe sore throat.

## 2011-03-04 NOTE — ED Notes (Signed)
No Change in status--pt. Kept updated

## 2011-03-04 NOTE — ED Notes (Signed)
C/o sore throat onset yesterday---rates pain 10 on 1-10 scale---no exudate seen---also nausea but no vomiting.

## 2011-03-04 NOTE — ED Provider Notes (Signed)
History    Scribed for Charlena Cross, MD, the patient was seen in room [facility]. This chart was scribed by Lyndee Hensen. This patient's care was started at 4:03PM.    CSN: [id number] Arrival date & time: 03/04/2011  3:03 PM  Chief Complaint  Patient presents with  . Nausea  . Sore Throat  . Abdominal Pain    HPI  (Consider location/radiation/quality/duration/timing/severity/associated sxs/prior treatment)  HPI Cheryl Abbott is a 65 y.o. female who presents to the Emergency Department complaining of gradual worsening of persistent sore throat with associated difficult swallowing, loose stools, nausea, lower left chest wall pain and RUQ abdominal pain that began yesterday am.   Patient hasn't urinated since 10:30 AM.  Left lower chest wall and RUQ abdominal pain worsens with movement.  Denies fever, otalgia, cough, SOB, chest pain, dysuria, diarrhea, and nasal congestion.  Reports 3 kidney stone removal surgeries in the past.  Patient has hx of DM, HTN, renal insufficiency and hyperlipidemia.    PAST MEDICAL HISTORY:  Past Medical History  Diagnosis Date  . Anxiety disorder   . Back pain   . Diabetes mellitus type 1   . Hyperlipidemia   . Hypertension   . Sleep apnea   . Disease of vein   . Renal insufficiency     PAST SURGICAL HISTORY:  Past Surgical History  Procedure Date  . Partial hysterectomy   . Extraction of left kidney stone   . Lithotryspy and stent replacement rt ureter 10/2004    12/2009 Dr, Michela Pitcher   . Removal of lump from posterior r thigh   . Bilateral stone removal with nephrostomy at [facility] October 2011    FAMILY HISTORY:  Family History  Problem Relation Age of Onset  . Hypertension Mother   . Diabetes Mother   . Heart failure Mother   . Dementia Mother   . Emphysema Father   . Hypertension Father   . Diabetes Brother   . GER disease Brother   . Cancer      family history   . Diabetes      family history   . Heart defect        famiily history   . Arthritis      family history      SOCIAL HISTORY: History   Social History  . Marital Status: Divorced    Spouse Name: N/A    Number of Children: 2  . Years of Education: N/A   Occupational History  . retired     Social History Main Topics  . Smoking status: Never Smoker   . Smokeless tobacco: None  . Alcohol Use: No  . Drug Use: No  . Sexually Active: None   Other Topics Concern  . None   Social History Narrative  . None      Review of Systems  Review of Systems 10 Systems reviewed and are negative for acute change except as noted in the HPI.  Allergies  Ace inhibitors; Nitrofurantoin; and Penicillins  Home Medications   Current Outpatient Rx  Name Route Sig Dispense Refill  . ACETAMINOPHEN 325 MG PO TABS Oral Take 650 mg by mouth. 2 at bedtime       . ALPRAZOLAM 0.5 MG PO TABS Oral Take 0.5 mg by mouth 2 (two) times daily. Take one tablet by mouth two times a day     . AMLODIPINE BESYLATE 5 MG PO TABS Oral Take 5 mg by mouth daily.      Marland Kitchen  ASPIRIN 81 MG PO CHEW Oral Chew 81 mg by mouth daily. Take one tablet by mouth once daily     . CEPHALEXIN 500 MG PO CAPS Oral Take 500 mg by mouth every 6 (six) hours as needed. For infection    . DOCUSATE SODIUM 100 MG PO CAPS Oral Take 100 mg by mouth at bedtime as needed. For relief    . FERROUS FUMARATE 325 (106 FE) MG PO TABS Oral Take by mouth. Take one tablet by mouth once a day      . INDAPAMIDE 1.25 MG PO TABS Oral Take 1.25 mg by mouth every morning.      Marland Kitchen LOVASTATIN 40 MG PO TABS  TAKE 1 TABLET BY MOUTH ONCE DAILY 30 tablet 3  . MAGNESIUM OXIDE-PYRIDOXINE HCL 362-20 MG PO TABS Oral Take 1 tablet by mouth 2 (two) times daily with a meal.      . METFORMIN HCL 500 MG PO TABS Oral Take 1 tablet (500 mg total) by mouth 2 (two) times daily with a meal. 120 tablet 3  . OMEPRAZOLE 20 MG PO CPDR Oral Take 20 mg by mouth daily.      Marland Kitchen PAROXETINE HCL 40 MG PO TABS Oral Take 1 tablet (40 mg total)  by mouth every morning. 30 tablet 3  . POTASSIUM CITRATE 5 MEQ (540 MG) PO TBCR Oral Take 1 tablet (5 mEq total) by mouth 2 (two) times daily. 60 tablet 3  . PROMETHAZINE HCL 25 MG PO TABS Oral Take 25 mg by mouth every 6 (six) hours as needed.      Marland Kitchen TRAMADOL HCL 50 MG PO TABS Oral Take 1 tablet (50 mg total) by mouth 2 (two) times daily. Take one tablet bid   60 tablet 3    Physical Exam    BP 120/81  Pulse 127  Temp(Src) 98.9 F (37.2 C) (Oral)  Resp 22  Ht 5\' 7"  (1.702 m)  Wt 200 lb (90.719 kg)  BMI 31.32 kg/m2  SpO2 97%  Physical Exam  Constitutional: She is oriented to person, place, and time. She appears well-developed and well-nourished.  HENT:  Head: Normocephalic and atraumatic.  Right Ear: External ear normal.  Left Ear: External ear normal.  Mouth/Throat: Oropharynx is clear and moist and mucous membranes are normal. No oropharyngeal exudate, posterior oropharyngeal edema or posterior oropharyngeal erythema.  Eyes: EOM are normal.  Neck: Neck supple. No thyromegaly present.  Cardiovascular: Intact distal pulses.   Pulmonary/Chest: Effort normal and breath sounds normal. No respiratory distress. She has no wheezes. She has no rales.       nlm all fields.   Abdominal: Soft. Bowel sounds are normal. She exhibits no distension. There is no tenderness. There is no rebound and no guarding.  Musculoskeletal: Normal range of motion. She exhibits no edema.  Neurological: She is alert and oriented to person, place, and time.  Skin: Skin is warm and dry. No rash noted.  Psychiatric: She has a normal mood and affect. Her behavior is normal.    ED Course  Procedures (including critical care time)    OTHER DATA REVIEWED: Nursing notes, vital signs, and past medical records reviewed.   DIAGNOSTIC STUDIES: Oxygen Saturation is 97% on room air, normal by my interpretation.      LABS / RADIOLOGY:   Results for orders placed during the hospital encounter of 03/04/11    CBC      Component Value Range   WBC 12.1 (*) 4.0 - 10.5 (  K/uL)   RBC 4.52  3.87 - 5.11 (MIL/uL)   Hemoglobin 10.1 (*) 12.0 - 15.0 (g/dL)   HCT 33.3 (*) 36.0 - 46.0 (%)   MCV 73.7 (*) 78.0 - 100.0 (fL)   MCH 22.3 (*) 26.0 - 34.0 (pg)   MCHC 30.3  30.0 - 36.0 (g/dL)   RDW 18.4 (*) 11.5 - 15.5 (%)   Platelets 484 (*) 150 - 400 (K/uL)  DIFFERENTIAL      Component Value Range   Neutrophils Relative 74  43 - 77 (%)   Neutro Abs 8.9 (*) 1.7 - 7.7 (K/uL)   Lymphocytes Relative 17  12 - 46 (%)   Lymphs Abs 2.0  0.7 - 4.0 (K/uL)   Monocytes Relative 9  3 - 12 (%)   Monocytes Absolute 1.1 (*) 0.1 - 1.0 (K/uL)   Eosinophils Relative 0  0 - 5 (%)   Eosinophils Absolute 0.1  0.0 - 0.7 (K/uL)   Basophils Relative 0  0 - 1 (%)   Basophils Absolute 0.0  0.0 - 0.1 (K/uL)  COMPREHENSIVE METABOLIC PANEL      Component Value Range   Sodium 133 (*) 135 - 145 (mEq/L)   Potassium 3.4 (*) 3.5 - 5.1 (mEq/L)   Chloride 91 (*) 96 - 112 (mEq/L)   CO2 26  19 - 32 (mEq/L)   Glucose, Bld 101 (*) 70 - 99 (mg/dL)   BUN 19  6 - 23 (mg/dL)   Creatinine, Ser 1.26 (*) 0.50 - 1.10 (mg/dL)   Calcium 11.0 (*) 8.4 - 10.5 (mg/dL)   Total Protein 8.5 (*) 6.0 - 8.3 (g/dL)   Albumin 3.8  3.5 - 5.2 (g/dL)   AST 15  0 - 37 (U/L)   ALT 12  0 - 35 (U/L)   Alkaline Phosphatase 78  39 - 117 (U/L)   Total Bilirubin 0.1 (*) 0.3 - 1.2 (mg/dL)   GFR calc non Af Amer 42 (*) >60 (mL/min)   GFR calc Af Amer 51 (*) >60 (mL/min)  LIPASE, BLOOD      Component Value Range   Lipase 43  11 - 59 (U/L)  URINALYSIS, ROUTINE W REFLEX MICROSCOPIC      Component Value Range   Color, Urine YELLOW  YELLOW    Appearance CLEAR  CLEAR    Specific Gravity, Urine 1.020  1.005 - 1.030    pH 6.0  5.0 - 8.0    Glucose, UA NEGATIVE  NEGATIVE (mg/dL)   Hgb urine dipstick NEGATIVE  NEGATIVE    Bilirubin Urine NEGATIVE  NEGATIVE    Ketones, ur NEGATIVE  NEGATIVE (mg/dL)   Protein, ur NEGATIVE  NEGATIVE (mg/dL)   Urobilinogen, UA 0.2  0.0 -  1.0 (mg/dL)   Nitrite NEGATIVE  NEGATIVE    Leukocytes, UA TRACE (*) NEGATIVE   URINE MICROSCOPIC-ADD ON      Component Value Range   Squamous Epithelial / LPF FEW (*) RARE    WBC, UA 3-6  <3 (WBC/hpf)   Bacteria, UA RARE  RARE   RAPID STREP SCREEN      Component Value Range   Streptococcus, Group A Screen (Direct) NEGATIVE  NEGATIVE        ED COURSE / COORDINATION OF CARE: 4:11 PM  Will order labs for abdominal discomfort.  UA for check for UTI and gallbladder infection.   Orders Placed This Encounter  Procedures  . Rapid strep screen  . Rapid strep screen  . CBC  . Differential  .  Comprehensive metabolic panel  . Lipase, blood  . Urinalysis with microscopic  . Urine microscopic-add on  . Nursing communication      DDx:  Viral pharyngitis.  Bacterial pharyngitis,. Seasonal Allergies Musculoskeletal Pain, UTI, and Biliary colic.   MDM: The patient's laboratory results have been reviewed and do not suggest acute cholecystitis or cholelithiasis, and her exam was only noteworthy to suspect these for reported pain at the right upper quadrant although there was no tenderness of the right upper quadrant, so examination is also thinning with the absence of acute gallbladder pathology. Her pains at her sides/abdomen appear to be musculoskeletal. She appears not to have a bacterial pharyngitis on exam or by a rapid strep screen. Her sore throat is likely due to allergies. I will treat her with pain medication and antihistamines and discharge her home.  IMPRESSION: Diagnoses that have been ruled out:  Diagnoses that are still under consideration:  Final diagnoses:     MEDICATIONS GIVEN IN THE E.D. Scheduled Meds:    . diphenhydrAMINE  25 mg Oral Once   Continuous Infusions:     DISCHARGE MEDICATIONS: New Prescriptions   No medications on file     I personally performed the services described in this documentation, which was scribed in my presence. The recorded  information has been reviewed and considered.            Charlena Cross, MD 03/30/11 [phone]

## 2011-03-04 NOTE — ED Notes (Signed)
Pt c/o sore throat with abd pain; pt states she feels nauseous, but no vomiting

## 2011-03-08 LAB — URINE MICROSCOPIC-ADD ON

## 2011-03-08 LAB — URINALYSIS, ROUTINE W REFLEX MICROSCOPIC
Bilirubin Urine: NEGATIVE
Glucose, UA: NEGATIVE
Ketones, ur: NEGATIVE
Nitrite: NEGATIVE
Protein, ur: 30 — AB
Specific Gravity, Urine: <1.005 — ABNORMAL LOW
Urobilinogen, UA: 0.2
pH: 5.5

## 2011-03-08 LAB — DIFFERENTIAL
Basophils Absolute: 0
Basophils Relative: 0
Eosinophils Absolute: 0.4
Eosinophils Relative: 4
Lymphocytes Relative: 10 — ABNORMAL LOW
Lymphs Abs: 1
Monocytes Absolute: 0.6
Monocytes Relative: 6
Neutro Abs: 7.9 — ABNORMAL HIGH
Neutrophils Relative %: 80 — ABNORMAL HIGH

## 2011-03-08 LAB — COMPREHENSIVE METABOLIC PANEL
ALT: 19
AST: 20
Albumin: 4
Alkaline Phosphatase: 94
BUN: 8
CO2: 27
Calcium: 10.2
Chloride: 101
Creatinine, Ser: 0.92
GFR calc Af Amer: >60
GFR calc non Af Amer: >60
Glucose, Bld: 97
Potassium: 3.8
Sodium: 136
Total Bilirubin: 0.3
Total Protein: 8.4 — ABNORMAL HIGH

## 2011-03-08 LAB — CBC
HCT: 40.7
Hemoglobin: 13.4
MCHC: 32.9
MCV: 92.5
Platelets: 340
RBC: 4.4
RDW: 15.8 — ABNORMAL HIGH
WBC: 10

## 2011-03-09 LAB — DIFFERENTIAL
Basophils Absolute: 0
Basophils Relative: 0
Eosinophils Absolute: 0.5
Eosinophils Relative: 4
Lymphocytes Relative: 3 — ABNORMAL LOW
Lymphs Abs: 0.4 — ABNORMAL LOW
Monocytes Absolute: 0.4
Monocytes Relative: 4
Neutro Abs: 10.1 — ABNORMAL HIGH
Neutrophils Relative %: 89 — ABNORMAL HIGH

## 2011-03-09 LAB — CBC
HCT: 38.6
Hemoglobin: 12.6
MCHC: 32.7
MCV: 92.6
Platelets: 308
RBC: 4.17
RDW: 15.9 — ABNORMAL HIGH
WBC: 11.3 — ABNORMAL HIGH

## 2011-03-09 LAB — URINALYSIS, ROUTINE W REFLEX MICROSCOPIC
Bilirubin Urine: NEGATIVE
Glucose, UA: NEGATIVE
Ketones, ur: NEGATIVE
Nitrite: NEGATIVE
Protein, ur: 30 — AB
Specific Gravity, Urine: 1.015
Urobilinogen, UA: 0.2
pH: 6

## 2011-03-09 LAB — COMPREHENSIVE METABOLIC PANEL
ALT: 20
AST: 24
Albumin: 3.8
Alkaline Phosphatase: 73
BUN: 12
CO2: 26
Calcium: 10.1
Chloride: 107
Creatinine, Ser: 1.04
GFR calc Af Amer: >60
GFR calc non Af Amer: 54 — ABNORMAL LOW
Glucose, Bld: 135 — ABNORMAL HIGH
Potassium: 3.8
Sodium: 140
Total Bilirubin: 0.2 — ABNORMAL LOW
Total Protein: 7.8

## 2011-03-09 LAB — URINE MICROSCOPIC-ADD ON

## 2011-03-14 ENCOUNTER — Other Ambulatory Visit: Payer: Self-pay | Admitting: Family Medicine

## 2011-03-14 DIAGNOSIS — Z139 Encounter for screening, unspecified: Secondary | ICD-10-CM

## 2011-03-22 ENCOUNTER — Ambulatory Visit (HOSPITAL_COMMUNITY)
Admission: RE | Admit: 2011-03-22 | Discharge: 2011-03-22 | Disposition: A | Discharge status: Home / Self Care | Payer: Medicare Other | Source: Ambulatory Visit | Attending: Family Medicine | Admitting: Family Medicine

## 2011-03-22 DIAGNOSIS — Z1231 Encounter for screening mammogram for malignant neoplasm of breast: Secondary | ICD-10-CM | POA: Insufficient documentation

## 2011-03-22 DIAGNOSIS — Z139 Encounter for screening, unspecified: Secondary | ICD-10-CM

## 2011-04-13 ENCOUNTER — Telehealth: Payer: Self-pay | Admitting: Family Medicine

## 2011-04-13 MED ORDER — ALPRAZOLAM 0.5 MG PO TABS: 0.5000 mg | ORAL_TABLET | Freq: Two times a day (BID) | ORAL | Status: DC

## 2011-04-13 NOTE — Telephone Encounter (Signed)
Sent in

## 2011-04-22 ENCOUNTER — Other Ambulatory Visit: Payer: Self-pay | Admitting: Family Medicine

## 2011-04-27 ENCOUNTER — Ambulatory Visit: Payer: Medicare Other | Admitting: Urology

## 2011-05-05 ENCOUNTER — Other Ambulatory Visit: Payer: Self-pay | Admitting: Family Medicine

## 2011-05-14 ENCOUNTER — Other Ambulatory Visit: Payer: Self-pay | Admitting: Family Medicine

## 2011-05-26 ENCOUNTER — Other Ambulatory Visit: Payer: Self-pay | Admitting: Family Medicine

## 2011-06-08 ENCOUNTER — Other Ambulatory Visit: Payer: Self-pay | Admitting: Urology

## 2011-06-08 ENCOUNTER — Ambulatory Visit (INDEPENDENT_AMBULATORY_CARE_PROVIDER_SITE_OTHER): Payer: Medicare Other | Admitting: Urology

## 2011-06-08 ENCOUNTER — Ambulatory Visit (HOSPITAL_COMMUNITY)
Admission: RE | Admit: 2011-06-08 | Discharge: 2011-06-08 | Disposition: A | Discharge status: Home / Self Care | Payer: Medicare Other | Source: Ambulatory Visit | Attending: Urology | Admitting: Urology

## 2011-06-08 DIAGNOSIS — R1032 Left lower quadrant pain: Secondary | ICD-10-CM | POA: Insufficient documentation

## 2011-06-08 DIAGNOSIS — R109 Unspecified abdominal pain: Secondary | ICD-10-CM

## 2011-06-08 DIAGNOSIS — R1031 Right lower quadrant pain: Secondary | ICD-10-CM | POA: Insufficient documentation

## 2011-06-08 DIAGNOSIS — N200 Calculus of kidney: Secondary | ICD-10-CM

## 2011-06-08 DIAGNOSIS — K7689 Other specified diseases of liver: Secondary | ICD-10-CM | POA: Insufficient documentation

## 2011-06-08 DIAGNOSIS — Z87440 Personal history of urinary (tract) infections: Secondary | ICD-10-CM

## 2011-06-14 ENCOUNTER — Ambulatory Visit (INDEPENDENT_AMBULATORY_CARE_PROVIDER_SITE_OTHER): Payer: Medicare Other | Admitting: Family Medicine

## 2011-06-14 ENCOUNTER — Encounter: Payer: Self-pay | Admitting: Family Medicine

## 2011-06-14 VITALS — BP 120/80 | HR 104 | Resp 16 | Ht 67.0 in | Wt 200.2 lb

## 2011-06-14 DIAGNOSIS — I10 Essential (primary) hypertension: Secondary | ICD-10-CM

## 2011-06-14 DIAGNOSIS — E785 Hyperlipidemia, unspecified: Secondary | ICD-10-CM

## 2011-06-14 DIAGNOSIS — Z23 Encounter for immunization: Secondary | ICD-10-CM

## 2011-06-14 DIAGNOSIS — F411 Generalized anxiety disorder: Secondary | ICD-10-CM

## 2011-06-14 DIAGNOSIS — E669 Obesity, unspecified: Secondary | ICD-10-CM

## 2011-06-14 DIAGNOSIS — E119 Type 2 diabetes mellitus without complications: Secondary | ICD-10-CM

## 2011-06-14 MED ORDER — METFORMIN HCL 500 MG PO TABS: 500.0000 mg | ORAL_TABLET | Freq: Two times a day (BID) | ORAL | Status: DC

## 2011-06-14 MED ORDER — OMEPRAZOLE 20 MG PO CPDR: 20.0000 mg | DELAYED_RELEASE_CAPSULE | Freq: Every day | ORAL | Status: DC

## 2011-06-14 MED ORDER — TRAMADOL HCL 50 MG PO TABS: 50.0000 mg | ORAL_TABLET | Freq: Two times a day (BID) | ORAL | Status: DC

## 2011-06-14 MED ORDER — ALPRAZOLAM 0.5 MG PO TABS: 0.5000 mg | ORAL_TABLET | Freq: Two times a day (BID) | ORAL | Status: DC

## 2011-06-14 MED ORDER — AMLODIPINE BESYLATE 5 MG PO TABS: 5.0000 mg | ORAL_TABLET | Freq: Every day | ORAL | Status: DC

## 2011-06-14 MED ORDER — LOVASTATIN 40 MG PO TABS: 40.0000 mg | ORAL_TABLET | Freq: Every day | ORAL | Status: DC

## 2011-06-14 NOTE — Patient Instructions (Addendum)
F/U in 3.5 months.  Please continue to follow with urology regarding the kidney stones.  It is important that you exercise regularly at least 30 minutes 5 times a week. If you develop chest pain, have severe difficulty breathing, or feel very tired, stop exercising immediately and seek medical attention   A healthy diet is rich in fruit, vegetables and whole grains. Poultry fish, nuts and beans are a healthy choice for protein rather then red meat. A low sodium diet and drinking 64 ounces of water daily is generally recommended. Oils and sweet should be limited. Carbohydrates especially for those who are diabetic or overweight, should be limited to 34-45 gram per meal. It is important to eat on a regular schedule, at least 3 times daily. Snacks should be primarily fruits, vegetables or nuts.   Labs today including microalb  I am glad that you have had your eye exam and mammogram

## 2011-06-15 LAB — HEMOGLOBIN A1C
Hgb A1c MFr Bld: 6.2 % — ABNORMAL HIGH (ref <5.7)
Mean Plasma Glucose: 131 mg/dL — ABNORMAL HIGH (ref <117)

## 2011-06-15 LAB — COMPLETE METABOLIC PANEL WITH GFR
ALT: 11 U/L (ref 0–35)
AST: 11 U/L (ref 0–37)
Albumin: 4.2 g/dL (ref 3.5–5.2)
Alkaline Phosphatase: 66 U/L (ref 39–117)
BUN: 23 mg/dL (ref 6–23)
CO2: 28 mEq/L (ref 19–32)
Calcium: 10.9 mg/dL — ABNORMAL HIGH (ref 8.4–10.5)
Chloride: 95 mEq/L — ABNORMAL LOW (ref 96–112)
Creat: 1.21 mg/dL — ABNORMAL HIGH (ref 0.50–1.10)
GFR, Est African American: 54 mL/min — ABNORMAL LOW
GFR, Est Non African American: 47 mL/min — ABNORMAL LOW
Glucose, Bld: 98 mg/dL (ref 70–99)
Potassium: 4.5 mEq/L (ref 3.5–5.3)
Sodium: 134 mEq/L — ABNORMAL LOW (ref 135–145)
Total Bilirubin: 0.2 mg/dL — ABNORMAL LOW (ref 0.3–1.2)
Total Protein: 7.9 g/dL (ref 6.0–8.3)

## 2011-06-15 LAB — LIPID PANEL
Cholesterol: 134 mg/dL (ref 0–200)
HDL: 34 mg/dL — ABNORMAL LOW (ref >39)
LDL Cholesterol: 66 mg/dL (ref 0–99)
Total CHOL/HDL Ratio: 3.9 Ratio
Triglycerides: 169 mg/dL — ABNORMAL HIGH (ref <150)
VLDL: 34 mg/dL (ref 0–40)

## 2011-06-15 LAB — MICROALBUMIN / CREATININE URINE RATIO
Creatinine, Urine: 141.2 mg/dL
Microalb Creat Ratio: 12.1 mg/g (ref 0.0–30.0)
Microalb, Ur: 1.71 mg/dL (ref 0.00–1.89)

## 2011-06-17 NOTE — Assessment & Plan Note (Signed)
Controlled, no change in medication  

## 2011-06-17 NOTE — Assessment & Plan Note (Signed)
Improved and controled, no med change

## 2011-06-17 NOTE — Assessment & Plan Note (Signed)
Elevated triglycerides and low HDL. Dietary changes needed to lower fat intake , and pt encouraged to commit to daily exercise

## 2011-06-17 NOTE — Progress Notes (Signed)
Subjective:     Patient ID: Cheryl Abbott, female   DOB: Oct 11, 1945, 66 y.o.   MRN: [id number]  HPI The PT is here for follow up and re-evaluation of chronic medical conditions, medication management and review of any available recent lab and radiology data.  Preventive health is updated, specifically  Cancer screening and Immunization.   Questions or concerns regarding consultations or procedures which the PT has had in the interim are  addressed. The PT denies any adverse reactions to current medications since the last visit.  Recent eval reveals recurrent bilateral kidney stones, she is following with local urologist . Denies polyuria, polydipsia or hypoglycemic episodes. Fasting sugars are seldom over 120     Review of Systems See HPI Denies recent fever or chills. Denies sinus pressure, nasal congestion, ear pain or sore throat. Denies chest congestion, productive cough or wheezing. Denies chest pains, palpitations and leg swelling Denies abdominal pain, nausea, vomiting,diarrhea or constipation.   Denies dysuria, frequency, hesitancy or incontinence. Denies joint pain, swelling and limitation in mobility. Denies headaches, seizures, numbness, or tingling. Denies uncontrolled  depression, anxiety or insomnia. Denies skin break down or rash.        Objective:   Physical Exam Patient alert and oriented and in no cardiopulmonary distress.  HEENT: No facial asymmetry, EOMI, no sinus tenderness,  oropharynx pink and moist.  Neck supple no adenopathy.  Chest: Clear to auscultation bilaterally.  CVS: S1, S2 no murmurs, no S3.  ABD: Soft non tender. Bowel sounds normal.  Ext: No edema  MS: Adequate ROM spine, shoulders, hips and knees.  Skin: Intact, no ulcerations or rash noted.  Psych: Good eye contact, normal affect. Memory intact not anxious or depressed appearing.  CNS: CN 2-12 intact, power, tone and sensation normal throughout. Diabetic Foot Check:    Appearance - no lesions, ulcers or calluses Skin - no unusual pallor or redness Sensation - grossly intact to light touch Monofilament testing -  Right - Great toe, medial, central, lateral ball and posterior foot intact Left - Great toe, medial, central, lateral ball and posterior foot intact Pulses Left - Dorsalis Pedis and Posterior Tibia normal Right - Dorsalis Pedis and Posterior Tibia normal     Assessment:         Plan:

## 2011-06-17 NOTE — Assessment & Plan Note (Signed)
Unchanged. Patient re-educated about  the importance of commitment to a  minimum of 150 minutes of exercise per week. The importance of healthy food choices with portion control discussed. Encouraged to start a food diary, count calories and to consider  joining a support group. Sample diet sheets offered. Goals set by the patient for the next several months.    

## 2011-07-06 IMAGING — MG MM DIGITAL SCREENING
4 series · 4 of 4 positions shown · non-contrast
Comparison: none

DG SCREEN MAMMOGRAM BILATERAL
Bilateral CC and MLO view(s) were taken.

DIGITAL SCREENING MAMMOGRAM WITH CAD:
There are scattered fibroglandular densities.  No masses or malignant type calcifications are 
identified.  Compared with prior studies.
Images were processed with CAD.

[L CC]
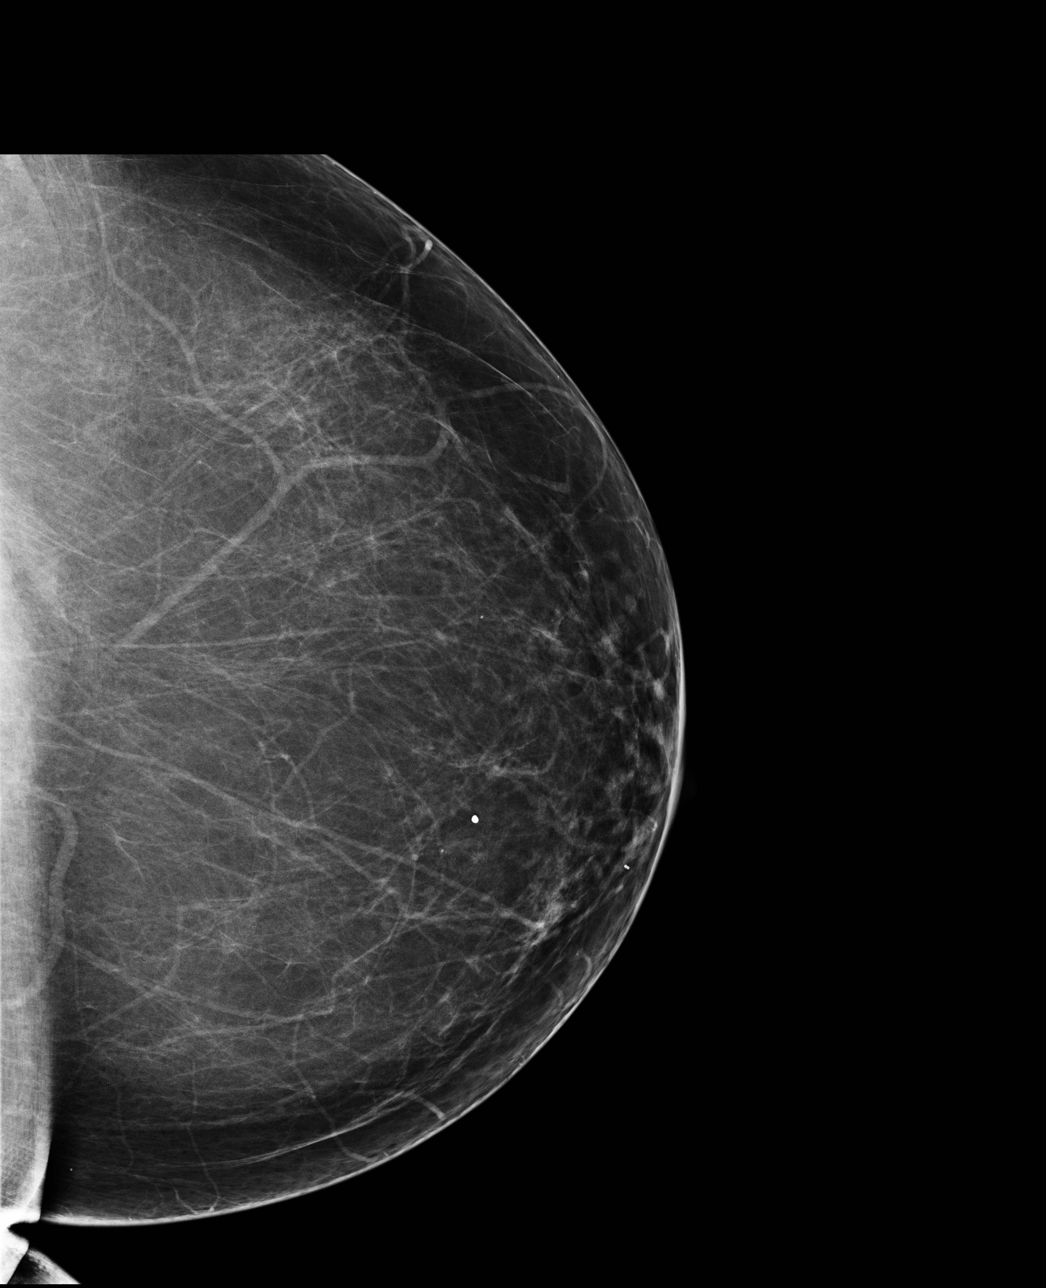

[L MLO]
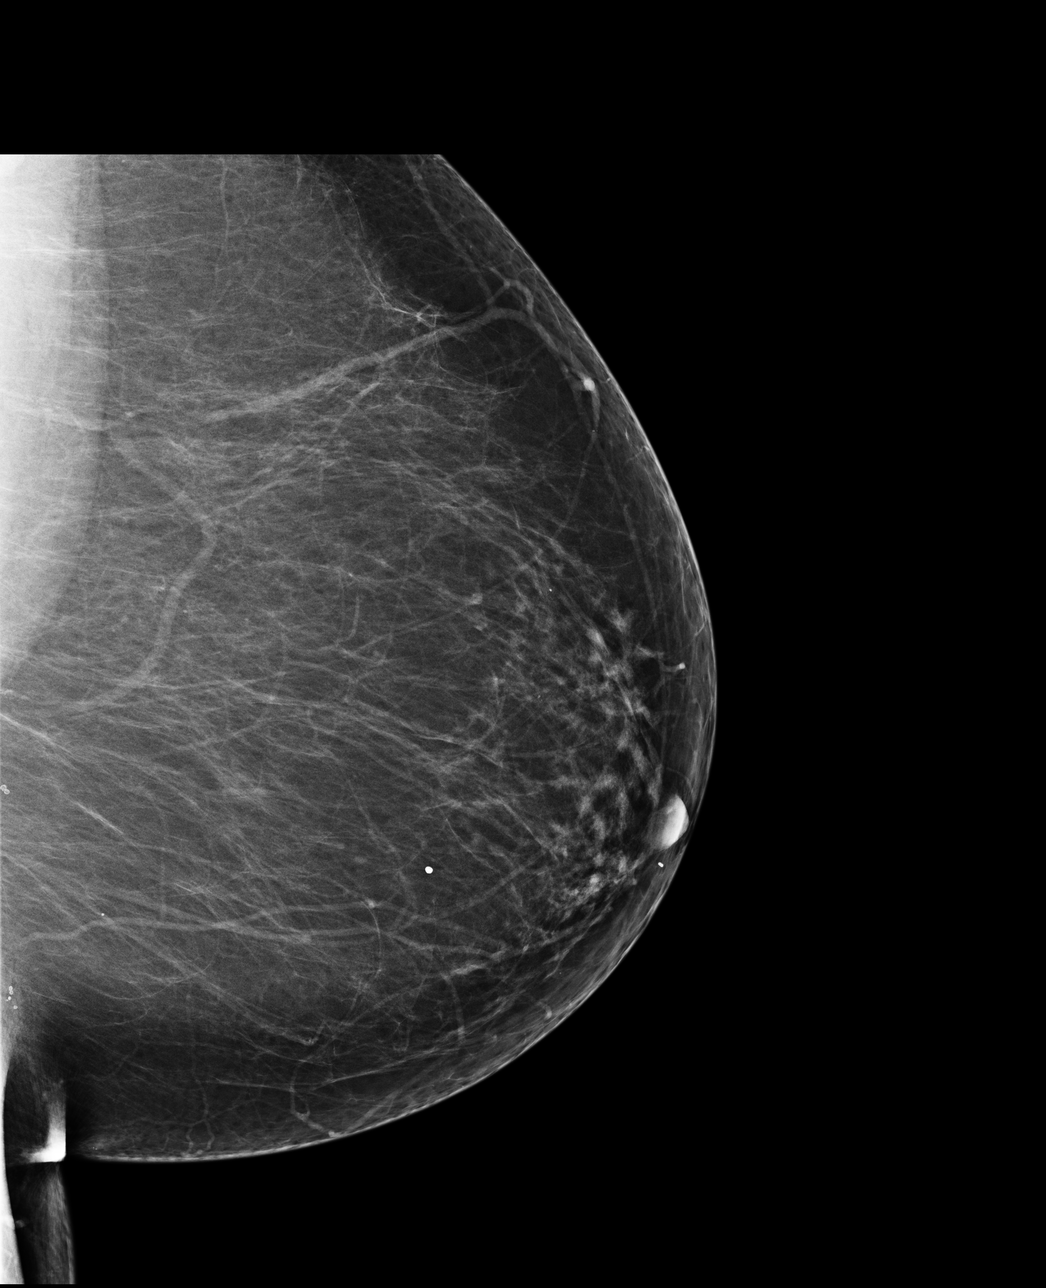

[R CC]
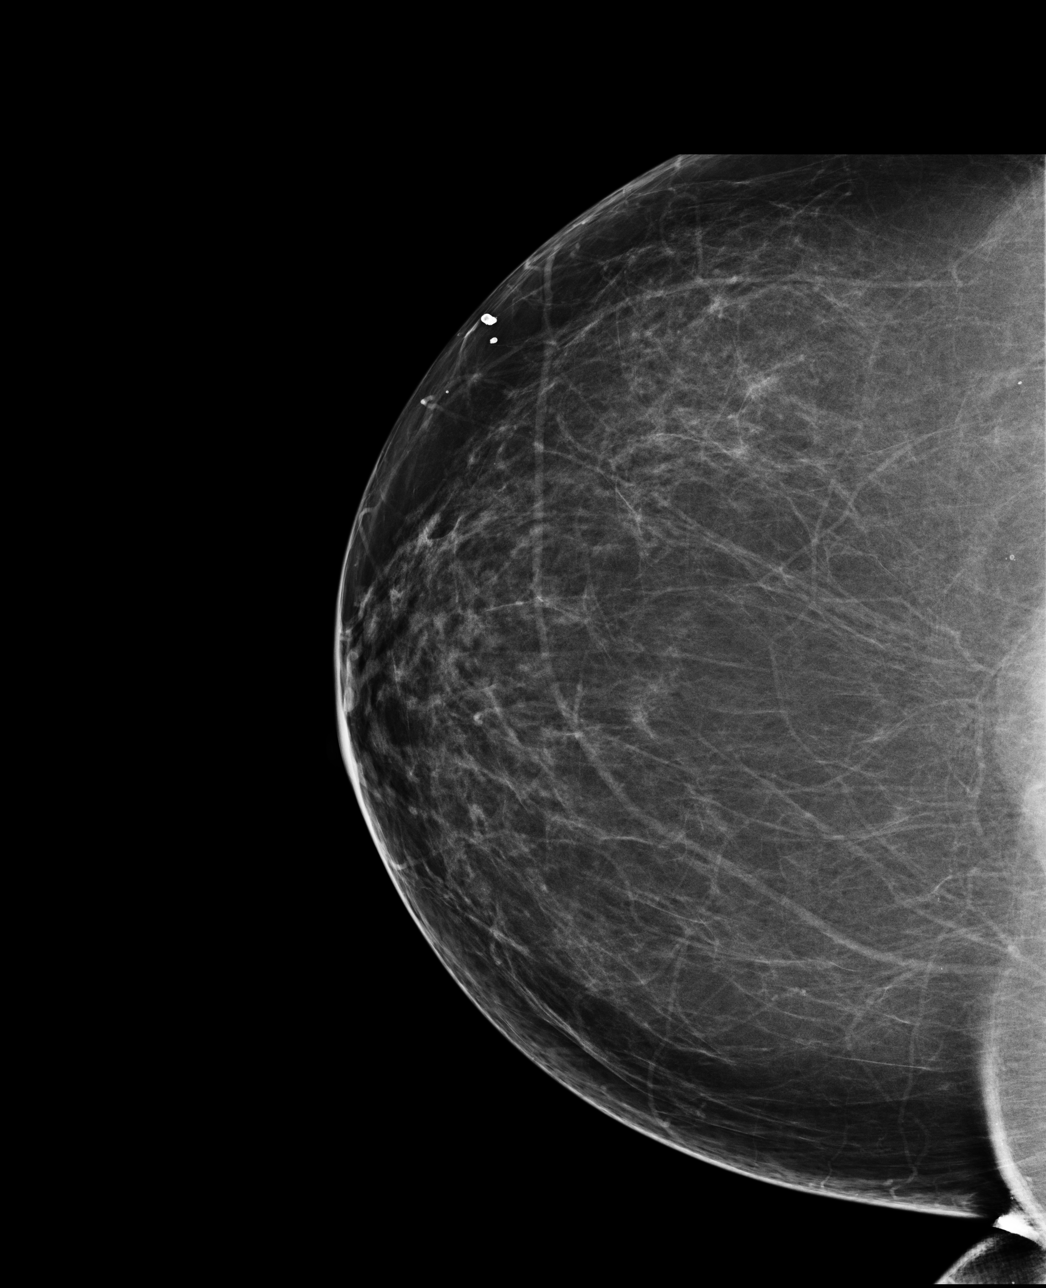

[R MLO]
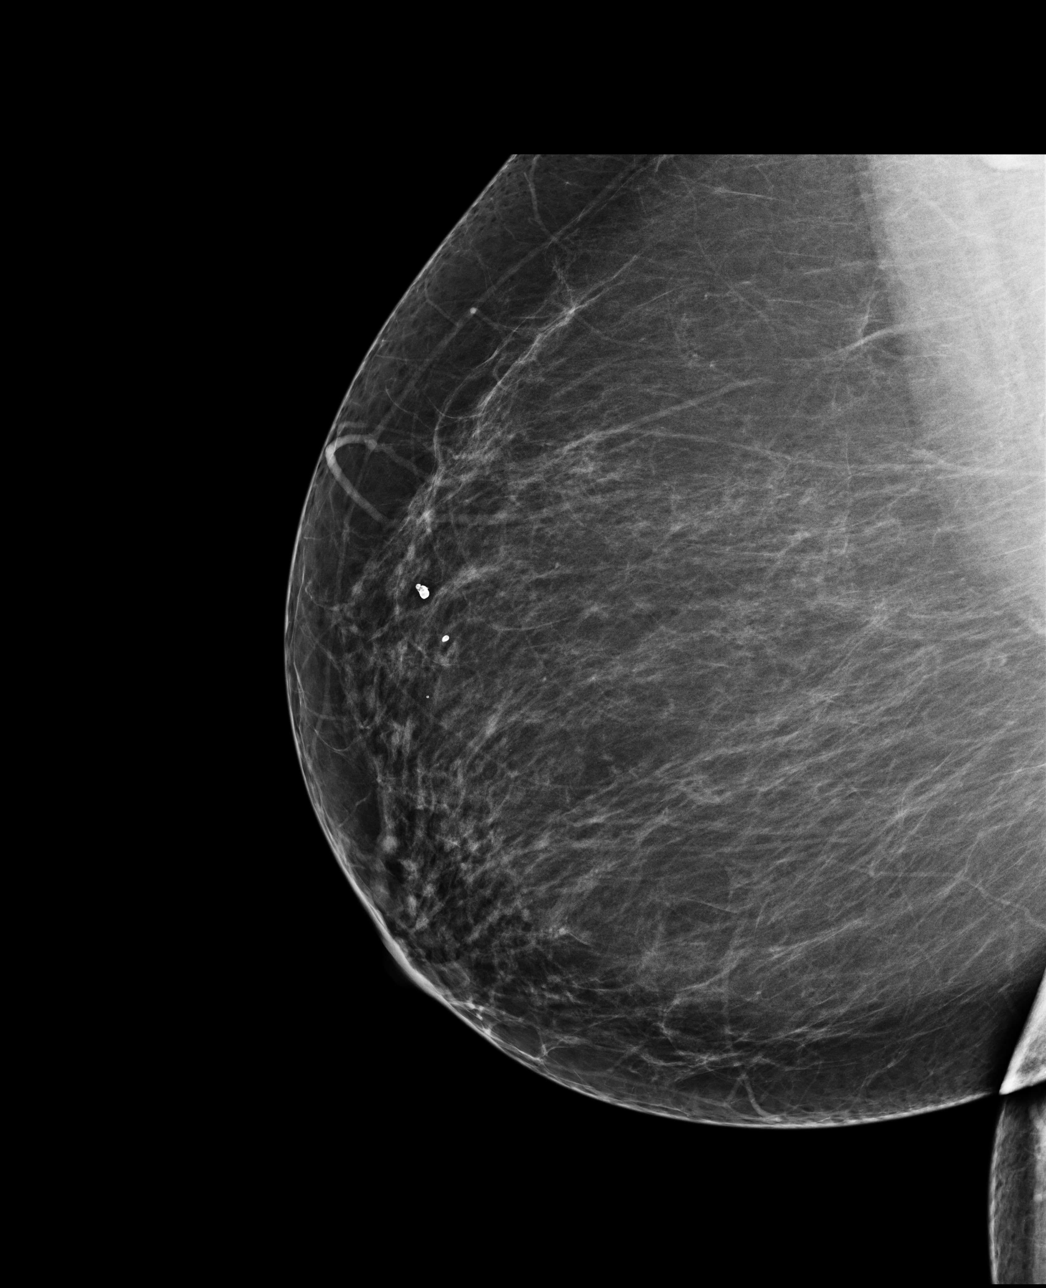

[4 of 4 positions shown; findings below may reference images not displayed]

IMPRESSION: No specific mammographic evidence of malignancy.  Next screening mammogram is recommended in one 
year.

A result letter of this screening mammogram will be mailed directly to the patient.

ASSESSMENT: Negative - BI-RADS 1

Screening mammogram in 1 year.
,

## 2011-07-16 ENCOUNTER — Telehealth: Payer: Self-pay

## 2011-07-16 NOTE — Telephone Encounter (Signed)
spk with pt and she needs to stay on current dose she has , excellent hBA1C in January send in the dose currently taking pls , any quest ask

## 2011-07-16 NOTE — Telephone Encounter (Signed)
Metformin sent in for 500mg  bid. Pt told pharmacy it should be 2 tabs bid. Is this correct because I don't see it documented anywhere.

## 2011-07-17 MED ORDER — METFORMIN HCL 500 MG PO TABS: 1000.0000 mg | ORAL_TABLET | Freq: Two times a day (BID) | ORAL | Status: DC

## 2011-07-17 NOTE — Telephone Encounter (Signed)
Pt has been taking 2 tabs bid so I sent the corrected rx back to pharmacy

## 2011-07-17 NOTE — Telephone Encounter (Signed)
Called pt left message .

## 2011-07-24 ENCOUNTER — Encounter (HOSPITAL_COMMUNITY): Payer: Self-pay | Admitting: *Deleted

## 2011-07-24 ENCOUNTER — Emergency Department (HOSPITAL_COMMUNITY): Payer: Medicare Other

## 2011-07-24 ENCOUNTER — Other Ambulatory Visit: Payer: Self-pay

## 2011-07-24 ENCOUNTER — Emergency Department (HOSPITAL_COMMUNITY)
Admission: EM | Admit: 2011-07-24 | Discharge: 2011-07-24 | Disposition: A | Discharge status: Home / Self Care | Payer: Medicare Other | Attending: Emergency Medicine | Admitting: Emergency Medicine

## 2011-07-24 DIAGNOSIS — R5381 Other malaise: Secondary | ICD-10-CM | POA: Insufficient documentation

## 2011-07-24 DIAGNOSIS — R079 Chest pain, unspecified: Secondary | ICD-10-CM | POA: Insufficient documentation

## 2011-07-24 DIAGNOSIS — E119 Type 2 diabetes mellitus without complications: Secondary | ICD-10-CM | POA: Insufficient documentation

## 2011-07-24 DIAGNOSIS — I498 Other specified cardiac arrhythmias: Secondary | ICD-10-CM | POA: Insufficient documentation

## 2011-07-24 DIAGNOSIS — E785 Hyperlipidemia, unspecified: Secondary | ICD-10-CM | POA: Insufficient documentation

## 2011-07-24 DIAGNOSIS — F411 Generalized anxiety disorder: Secondary | ICD-10-CM | POA: Insufficient documentation

## 2011-07-24 DIAGNOSIS — Z7982 Long term (current) use of aspirin: Secondary | ICD-10-CM | POA: Insufficient documentation

## 2011-07-24 DIAGNOSIS — R5383 Other fatigue: Secondary | ICD-10-CM | POA: Insufficient documentation

## 2011-07-24 DIAGNOSIS — I10 Essential (primary) hypertension: Secondary | ICD-10-CM | POA: Insufficient documentation

## 2011-07-24 DIAGNOSIS — R197 Diarrhea, unspecified: Secondary | ICD-10-CM | POA: Insufficient documentation

## 2011-07-24 DIAGNOSIS — R61 Generalized hyperhidrosis: Secondary | ICD-10-CM | POA: Insufficient documentation

## 2011-07-24 DIAGNOSIS — Z9079 Acquired absence of other genital organ(s): Secondary | ICD-10-CM | POA: Insufficient documentation

## 2011-07-24 DIAGNOSIS — D649 Anemia, unspecified: Secondary | ICD-10-CM

## 2011-07-24 LAB — BASIC METABOLIC PANEL
BUN: 20 mg/dL (ref 6–23)
CO2: 24 mEq/L (ref 19–32)
Calcium: 10.4 mg/dL (ref 8.4–10.5)
Chloride: 95 mEq/L — ABNORMAL LOW (ref 96–112)
Creatinine, Ser: 1.17 mg/dL — ABNORMAL HIGH (ref 0.50–1.10)
GFR calc Af Amer: 55 mL/min — ABNORMAL LOW (ref >90)
GFR calc non Af Amer: 47 mL/min — ABNORMAL LOW (ref >90)
Glucose, Bld: 209 mg/dL — ABNORMAL HIGH (ref 70–99)
Potassium: 3.3 mEq/L — ABNORMAL LOW (ref 3.5–5.1)
Sodium: 134 mEq/L — ABNORMAL LOW (ref 135–145)

## 2011-07-24 LAB — CBC
HCT: 31.7 % — ABNORMAL LOW (ref 36.0–46.0)
Hemoglobin: 9.4 g/dL — ABNORMAL LOW (ref 12.0–15.0)
MCH: 21 pg — ABNORMAL LOW (ref 26.0–34.0)
MCHC: 29.7 g/dL — ABNORMAL LOW (ref 30.0–36.0)
MCV: 70.8 fL — ABNORMAL LOW (ref 78.0–100.0)
Platelets: 458 10*3/uL — ABNORMAL HIGH (ref 150–400)
RBC: 4.48 MIL/uL (ref 3.87–5.11)
RDW: 19 % — ABNORMAL HIGH (ref 11.5–15.5)
WBC: 10.2 10*3/uL (ref 4.0–10.5)

## 2011-07-24 LAB — D-DIMER, QUANTITATIVE: D-Dimer, Quant: 0.42 ug/mL-FEU (ref 0.00–0.48)

## 2011-07-24 LAB — POCT I-STAT TROPONIN I: Troponin i, poc: 0.02 ng/mL (ref 0.00–0.08)

## 2011-07-24 MED ORDER — MORPHINE SULFATE 2 MG/ML IJ SOLN
2.0000 mg | Freq: Once | INTRAMUSCULAR | Status: AC
  Administered 2011-07-24: 2 mg via INTRAVENOUS
  Filled 2011-07-24: qty 1

## 2011-07-24 MED ORDER — ACETAMINOPHEN 325 MG PO TABS
650.0000 mg | ORAL_TABLET | Freq: Once | ORAL | Status: AC
  Administered 2011-07-24: 650 mg via ORAL
  Filled 2011-07-24: qty 2

## 2011-07-24 NOTE — ED Notes (Signed)
Pt stable at discharge Discussed follow with PCP for anemia; Pt states constipation from iron before also informed her to discuss with MD

## 2011-07-24 NOTE — Discharge Instructions (Signed)
Anemia, Frequently Asked Questions WHAT ARE THE SYMPTOMS OF ANEMIA?  Headache.   Difficulty thinking.   Fatigue.   Shortness of breath.   Weakness.   Rapid heartbeat.  AT WHAT POINT ARE PEOPLE CONSIDERED ANEMIC?  This varies with gender and age.   Both hemoglobin (Hgb) and hematocrit values are used to define anemia. These lab values are obtained from a complete blood count (CBC) test. This is performed at a caregiver's office.   The normal range of hemoglobin values for adult men is 14.0 g/dL to 17.4 g/dL. For nonpregnant women, values are 12.3 g/dL to 15.3 g/dL.   The World Health Organization defines anemia as less than 12 g/dL for nonpregnant women and less than 13 g/dL for men.   For adult males, the average normal hematocrit is 46%, and the range is 40% to 52%.   For adult females, the average normal hematocrit is 41%, and the range is 35% to 47%.   Values that fall below the lower limits can be a sign of anemia and should have further checking (evaluation).  GROUPS OF PEOPLE WHO ARE AT RISK FOR DEVELOPING ANEMIA INCLUDE:   Infants who are breastfed or taking a formula that is not fortified with iron.   Children going through a rapid growth spurt. The iron available can not keep up with the needs for a red cell mass which must grow with the child.   Women in childbearing years. They need iron because of blood loss during menstruation.   Pregnant women. The growing fetus creates a high demand for iron.   People with ongoing gastrointestinal blood loss are at risk of developing iron deficiency.   Individuals with leukemia or cancer who must receive chemotherapy or radiation to treat their disease. The drugs or radiation used to treat these diseases often decreases the bone marrow's ability to make cells of all classes. This includes red blood cells, white blood cells, and platelets.   Individuals with chronic inflammatory conditions such as rheumatoid arthritis or  chronic infections.   The elderly.  ARE SOME TYPES OF ANEMIA INHERITED?   Yes, some types of anemia are due to inherited or genetic defects.   Sickle cell anemia. This occurs most often in people of African, African American, and Mediterranean descent.   Thalassemia (or Cooley's anemia). This type is found in people of Mediterranean and Southeast Asian descent. These types of anemia are common.   [place]. This is rare.  CAN CERTAIN MEDICATIONS CAUSE A PERSON TO BECOME ANEMIC?  Yes. For example, drugs to fight cancer (chemotherapeutic agents) often cause anemia. These drugs can slow the bone marrow's ability to make red blood cells. If there are not enough red blood cells, the body does not get enough oxygen. WHAT HEMATOCRIT LEVEL IS REQUIRED TO DONATE BLOOD?  The lower limit of an acceptable hematocrit for blood donors is 38%. If you have a low hematocrit value, you should schedule an appointment with your caregiver. ARE BLOOD TRANSFUSIONS COMMONLY USED TO CORRECT ANEMIA, AND ARE THEY DANGEROUS?  They are used to treat anemia as a last resort. Your caregiver will find the cause of the anemia and correct it if possible. Most blood transfusions are given because of excessive bleeding at the time of surgery, with trauma, or because of bone marrow suppression in patients with cancer or leukemia on chemotherapy. Blood transfusions are safer than ever before. We also know that blood transfusions affect the immune system and may increase certain risks. There is   also a concern for human error. In 1/16,000 transfusions, a patient receives a transfusion of blood that is not matched with his or her blood type.  WHAT IS IRON DEFICIENCY ANEMIA AND CAN I CORRECT IT BY CHANGING MY DIET?  Iron is an essential part of hemoglobin. Without enough hemoglobin, anemia develops and the body does not get the right amount of oxygen. Iron deficiency anemia develops after the body has had a low level of iron for a long  time. This is either caused by blood loss, not taking in or absorbing enough iron, or increased demands for iron (like pregnancy or rapid growth).  Foods from animal origin such as beef, chicken, and pork, are good sources of iron. Be sure to have one of these foods at each meal. Vitamin C helps your body absorb iron. Foods rich in Vitamin C include citrus, bell pepper, strawberries, spinach and cantaloupe. In some cases, iron supplements may be needed in order to correct the iron deficiency. In the case of poor absorption, extra iron may have to be given directly into the vein through a needle (intravenously). I HAVE BEEN DIAGNOSED WITH IRON DEFICIENCY ANEMIA AND MY CAREGIVER PRESCRIBED IRON SUPPLEMENTS. HOW LONG WILL IT TAKE FOR MY BLOOD TO BECOME NORMAL?  It depends on the degree of anemia at the beginning of treatment. Most people with mild to moderate iron deficiency, anemia will correct the anemia over a period of 2 to 3 months. But after the anemia is corrected, the iron stored by the body is still low. Caregivers often suggest an additional 6 months of oral iron therapy once the anemia has been reversed. This will help prevent the iron deficiency anemia from quickly happening again. Non-anemic adult males should take iron supplements only under the direction of a doctor, too much iron can cause liver damage.  MY HEMOGLOBIN IS 9 G/DL AND I AM SCHEDULED FOR SURGERY. SHOULD I POSTPONE THE SURGERY?  If you have Hgb of 9, you should discuss this with your caregiver right away. Many patients with similar hemoglobin levels have had surgery without problems. If minimal blood loss is expected for a minor procedure, no treatment may be necessary.  If a greater blood loss is expected for more extensive procedures, you should ask your caregiver about being treated with erythropoietin and iron. This is to accelerate the recovery of your hemoglobin to a normal level before surgery. An anemic patient who undergoes  high-blood-loss surgery has a greater risk of surgical complications and need for a blood transfusion, which also carries some risk.  I HAVE BEEN TOLD THAT HEAVY MENSTRUAL PERIODS CAUSE ANEMIA. IS THERE ANYTHING I CAN DO TO PREVENT THE ANEMIA?  Anemia that results from heavy periods is usually due to iron deficiency. You can try to meet the increased demands for iron caused by the heavy monthly blood loss by increasing the intake of iron-rich foods. Iron supplements may be required. Discuss your concerns with your caregiver. WHAT CAUSES ANEMIA DURING PREGNANCY?  Pregnancy places major demands on the body. The mother must meet the needs of both her body and her growing baby. The body needs enough iron and folate to make the right amount of red blood cells. To prevent anemia while pregnant, the mother should stay in close contact with her caregiver.  Be sure to eat a diet that has foods rich in iron and folate like liver and dark green leafy vegetables. Folate plays an important role in the normal development of a baby's   spinal cord. Folate can help prevent serious disorders like spina bifida. If your diet does not provide adequate nutrients, you may want to talk with your caregiver about nutritional supplements.  WHAT IS THE RELATIONSHIP BETWEEN FIBROID TUMORS AND ANEMIA IN WOMEN?  The relationship is usually caused by the increased menstrual blood loss caused by fibroids. Good iron intake may be required to prevent iron deficiency anemia from developing.  Document Released: 01/04/2004 Document Revised: 02/07/2011 Document Reviewed: 06/20/2010 [facility] Patient Information 2012 [place].  Your caregiver has diagnosed you as having chest pain that is not specific for one problem, but does not require admission.  Chest pain comes from many different causes.  SEEK IMMEDIATE MEDICAL ATTENTION IF: You have severe chest pain, especially if the pain is crushing or pressure-like and spreads to the arms,  back, neck, or jaw, or if you have sweating, nausea (feeling sick to your stomach), or shortness of breath. THIS IS AN EMERGENCY. Don't wait to see if the pain will go away. Get medical help at once. Call 911 or 0 (operator). DO NOT drive yourself to the hospital.  Your chest pain gets worse and does not go away with rest.  You have an attack of chest pain lasting longer than usual, despite rest and treatment with the medications your caregiver has prescribed.  You wake from sleep with chest pain or shortness of breath.  You feel dizzy or faint.  You have chest pain not typical of your usual pain for which you originally saw your caregiver.

## 2011-07-24 NOTE — ED Notes (Signed)
Received report agree with previous assessment

## 2011-07-24 NOTE — ED Notes (Signed)
Intermittent central cp x 1 wk - worse and constant since yesterday with sob, weakness, and nausea.

## 2011-07-24 NOTE — ED Notes (Signed)
Cardiac monitor showing NSR. Pt states that she has been having chest pain off and on for a week. Also c/o problems with her glucose being high, nausea and diarrhea for the last couple days.

## 2011-07-24 NOTE — ED Provider Notes (Signed)
History  Scribed for Cheryl Cable, MD, the patient was seen in room APA07/APA07. This chart was scribed by Truddie Coco. The patient's care started at 6:21 PM    CSN: [id number]  Arrival date & time 07/24/11  1740   First MD Initiated Contact with Patient 07/24/11 1751      Chief Complaint  Patient presents with  . Chest Pain     Patient is a 66 y.o. female presenting with chest pain. The history is provided by the patient.  Chest Pain The chest pain began 1 - 2 weeks ago. Chest pain occurs intermittently. The chest pain is worsening. The pain is associated with stress. The quality of the pain is described as sharp. Chest pain is worsened by stress. Primary symptoms include fatigue. Pertinent negatives for primary symptoms include no shortness of breath.  Associated symptoms include diaphoresis (at baseline for pt) and weakness.    Cheryl Abbott is a 66 y.o. female who presents to the Emergency Department complaining of intermittent chest pain in the middle of her chest with episodes that last several seconds occuring two or three times a day.  She states that these episodes began about a week ago and have become progressively worse.  She describes the pain as sharp and states that stress seems to make the pain worse.  Walking and deep breathing does not make the pain worse.  She is also experiencing fatigue, weakness, diarrhea, and sweats (chronic per patient) but denies SOB.  Her PCP is Dr. Moshe Cipro.   Past Medical History  Diagnosis Date  . Anxiety disorder   . Back pain   . Diabetes mellitus type 1   . Hyperlipidemia   . Hypertension   . Sleep apnea   . Disease of vein   . Renal insufficiency     Past Surgical History  Procedure Date  . Partial hysterectomy   . Extraction of left kidney stone   . Lithotryspy and stent replacement rt ureter 10/2004    12/2009 Dr, Michela Pitcher   . Removal of lump from posterior r thigh   . Bilateral stone removal with nephrostomy at  [facility] October 2011    Family History  Problem Relation Age of Onset  . Hypertension Mother   . Diabetes Mother   . Heart failure Mother   . Dementia Mother   . Emphysema Father   . Hypertension Father   . Diabetes Brother   . GER disease Brother   . Cancer      family history   . Diabetes      family history   . Heart defect      famiily history   . Arthritis      family history     History  Substance Use Topics  . Smoking status: Never Smoker   . Smokeless tobacco: Not on file  . Alcohol Use: No    OB History    Grav Para Term Preterm Abortions TAB SAB Ect Mult Living                  Review of Systems  Constitutional: Positive for diaphoresis (at baseline for pt) and fatigue.  Respiratory: Negative for shortness of breath.   Cardiovascular: Positive for chest pain.  Gastrointestinal: Positive for diarrhea.  Neurological: Positive for weakness.  All other systems reviewed and are negative.    Allergies  Ace inhibitors; Keflex; Nitrofurantoin; and Penicillins  Home Medications   Current Outpatient Rx  Name Route  Sig Dispense Refill  . ACETAMINOPHEN 500 MG PO TABS Oral Take 1,000 mg by mouth at bedtime. For sleep and pain in back    . ALPRAZOLAM 0.5 MG PO TABS Oral Take 1 tablet (0.5 mg total) by mouth 2 (two) times daily. Take one tablet by mouth two times a day 60 tablet 3  . AMLODIPINE BESYLATE 5 MG PO TABS Oral Take 1 tablet (5 mg total) by mouth daily. 30 tablet 4  . ASPIRIN EC 81 MG PO TBEC Oral Take 162 mg by mouth daily.    . CHOLECALCIFEROL 400 UNITS PO TABS Oral Take 400 Units by mouth every morning.    . INDAPAMIDE 1.25 MG PO TABS Oral Take 1.25 mg by mouth every morning.      Marland Kitchen LOVASTATIN 40 MG PO TABS Oral Take 1 tablet (40 mg total) by mouth daily. 30 tablet 4  . METFORMIN HCL 500 MG PO TABS Oral Take 2 tablets (1,000 mg total) by mouth 2 (two) times daily with a meal. 120 tablet 4  . OMEPRAZOLE 20 MG PO CPDR Oral Take 20 mg by  mouth 2 (two) times daily.    Marland Kitchen PAROXETINE HCL 40 MG PO TABS  TAKE 1 TABLET BY MOUTH EVERY MORNING 30 tablet 4  . POTASSIUM CITRATE ER 10 MEQ (1080 MG) PO TBCR Oral Take 20 mEq by mouth 2 (two) times daily. 2 tabs twice daily    . TRAMADOL HCL 50 MG PO TABS Oral Take 1 tablet (50 mg total) by mouth 2 (two) times daily. 60 tablet 3    BP 114/64  Temp(Src) 98.1 F (36.7 C) (Oral)  Resp 20  Ht 5\' 6"  (1.676 m)  Wt 200 lb (90.719 kg)  BMI 32.28 kg/m2  SpO2 93%  Physical Exam CONSTITUTIONAL: Well developed/well nourished HEAD AND FACE: Normocephalic/atraumatic EYES: EOMI/PERRL ENMT: Mucous membranes moist NECK: supple no meningeal signs SPINE:entire spine nontender CV: S1/S2 noted, no murmurs/rubs/gallops noted LUNGS: Lungs are clear to auscultation bilaterally, no apparent distress ABDOMEN: soft, nontender, no rebound or guarding GU:no cva tenderness NEURO: Pt is awake/alert, moves all extremitiesx4 EXTREMITIES: pulses normal, full ROM SKIN: warm, color normal PSYCH: no abnormalities of mood noted  ED Course  Procedures   DIAGNOSTIC STUDIES: Oxygen Saturation is 93% on room air, normal by my interpretation.    COORDINATION OF CARE:  5:51PM Ordered: Cardiac monitoring ; Pulse oximetry, continuous ; ED EKG (<80mins upon arrival to the ED) ; Saline lock IV ; CBC ; Basic metabolic panel ; Perform i-Stat troponin I ; Chest Portable 1 View  5:53PM Ordered: i-Stat troponin I  6:27PM Ordered: morphine 2 MG/ML injection 2 mg Pt reports h/o cardiac cath 5 yrs ago but no records found Pt denies any known cardiac history, reports cath was "normal"  8:04 PM D-dimer negative Pt otherwise well appearing Reports brief episodes of sharp CP None at this time Well appearing, doubt ACS/Dissection Pt given morphine for pain, will monitor to allow meds to metabolize  Labs Reviewed  CBC - Abnormal; Notable for the following:    Hemoglobin 9.4 (*)    HCT 31.7 (*)    MCV 70.8 (*)    MCH  21.0 (*)    MCHC 29.7 (*)    RDW 19.0 (*)    Platelets 458 (*)    All other components within normal limits  BASIC METABOLIC PANEL - Abnormal; Notable for the following:    Sodium 134 (*)    Potassium 3.3 (*)  Chloride 95 (*)    Glucose, Bld 209 (*)    Creatinine, Ser 1.17 (*)    GFR calc non Af Amer 47 (*)    GFR calc Af Amer 55 (*)    All other components within normal limits  POCT I-STAT TROPONIN I      MDM  Nursing notes reviewed and considered in documentation All labs/vitals reviewed and considered xrays reviewed and considered Previous records reviewed and considered     Date: 07/24/2011  Rate: 104  Rhythm: sinus tachycardia  QRS Axis: normal  Intervals: normal  ST/T Wave abnormalities: nonspecific ST changes  Conduction Disutrbances:none  Narrative Interpretation:   Old EKG Reviewed: unchanged    I personally performed the services described in this documentation, which was scribed in my presence. The recorded information has been reviewed and considered.           Cheryl Cable, MD 07/24/11 [phone]

## 2011-07-24 NOTE — ED Notes (Signed)
Pt ambulated in hall without problems with not complaints.

## 2011-08-01 ENCOUNTER — Encounter: Payer: Self-pay | Admitting: Gastroenterology

## 2011-08-07 ENCOUNTER — Ambulatory Visit (INDEPENDENT_AMBULATORY_CARE_PROVIDER_SITE_OTHER): Payer: Medicare Other | Admitting: Family Medicine

## 2011-08-07 ENCOUNTER — Encounter: Payer: Self-pay | Admitting: Family Medicine

## 2011-08-07 VITALS — BP 138/84 | HR 101 | Temp 98.1°F | Resp 18 | Ht 67.0 in | Wt 199.0 lb

## 2011-08-07 DIAGNOSIS — R5381 Other malaise: Secondary | ICD-10-CM

## 2011-08-07 DIAGNOSIS — K219 Gastro-esophageal reflux disease without esophagitis: Secondary | ICD-10-CM

## 2011-08-07 DIAGNOSIS — F411 Generalized anxiety disorder: Secondary | ICD-10-CM

## 2011-08-07 DIAGNOSIS — E119 Type 2 diabetes mellitus without complications: Secondary | ICD-10-CM

## 2011-08-07 DIAGNOSIS — M940 Chondrocostal junction syndrome [Tietze]: Secondary | ICD-10-CM

## 2011-08-07 DIAGNOSIS — I10 Essential (primary) hypertension: Secondary | ICD-10-CM

## 2011-08-07 DIAGNOSIS — R112 Nausea with vomiting, unspecified: Secondary | ICD-10-CM

## 2011-08-07 DIAGNOSIS — E669 Obesity, unspecified: Secondary | ICD-10-CM

## 2011-08-07 MED ORDER — IBUPROFEN 800 MG PO TABS: 800.0000 mg | ORAL_TABLET | Freq: Three times a day (TID) | ORAL | Status: AC | PRN

## 2011-08-07 MED ORDER — PROMETHAZINE HCL 12.5 MG PO TABS: 12.5000 mg | ORAL_TABLET | Freq: Four times a day (QID) | ORAL | Status: AC | PRN

## 2011-08-07 NOTE — Patient Instructions (Addendum)
F/U in mid May.  You will get medication sent in for chest wall pain and also for acute gastroenteritis.  I will fill xanax early but   You need to go back to as prescribed, I understand you felt stressed and took extra  It is important that you exercise regularly at least 30 minutes 5 times a week. If you develop chest pain, have severe difficulty breathing, or feel very tired, stop exercising immediately and seek medical attention    A healthy diet is rich in fruit, vegetables and whole grains. Poultry fish, nuts and beans are a healthy choice for protein rather then red meat. A low sodium diet and drinking 64 ounces of water daily is generally recommended. Oils and sweet should be limited. Carbohydrates especially for those who are diabetic or overweight, should be limited to 34-45 gram per meal. It is important to eat on a regular schedule, at least 3 times daily. Snacks should be primarily fruits, vegetables or nuts.   You will get the BRAT diet   HBA1C and chem 7 in May non fasting before visit

## 2011-08-07 NOTE — Progress Notes (Signed)
  Subjective:    Patient ID: Cheryl Abbott, female    DOB: 05-21-1946, 67 y.o.   MRN: [id number]  HPI Acute vomiting and diarrheah today, 4 waterty stools, vomit x 2. Was treated in Ed for chest wall pain recently, still has tendeness over left CC junction. Otherwise had recent increase in stress due to illness and death in her family, and has been using an excess of xanax as a result is asking for early fill. Denies polyuria , polydipsia, blurred vision, fasting blood sugars are seldom over 120   Review of Systems See HPI Denies recent fever or chills. Denies sinus pressure, nasal congestion, ear pain or sore throat. Denies chest congestion, productive cough or wheezing. Denies any current  chest pains, palpitations and leg swelling  Denies dysuria, frequency, hesitancy or incontinence. Denies joint pain, swelling and limitation in mobility. Denies headaches, seizures, numbness, or tingling. Denies depression, has recently had increased anxiety or insomnia. Denies skin break down or rash.        Objective:   Physical Exam Patient alert and oriented and in no cardiopulmonary distress.  HEENT: No facial asymmetry, EOMI, no sinus tenderness,  oropharynx pink and moist.  Neck supple no adenopathy.  Chest: Clear to auscultation bilaterally.No reproducible chest wall tenderness  CVS: S1, S2 no murmurs, no S3.  ABD: Soft .Increased  Bowel sounds diffuse superficial tenderness, no guarding or rebound  Ext: No edema  MS: Adequate ROM spine, shoulders, hips and knees.  Skin: Intact, no ulcerations or rash noted.  Psych: Good eye contact, normal affect. Memory intact mildly anxious not  depressed appearing.  CNS: CN 2-12 intact, power, tone and sensation normal throughout.        Assessment & Plan:

## 2011-08-11 ENCOUNTER — Emergency Department (HOSPITAL_COMMUNITY): Payer: No Typology Code available for payment source

## 2011-08-11 ENCOUNTER — Encounter (HOSPITAL_COMMUNITY): Payer: Self-pay | Admitting: Emergency Medicine

## 2011-08-11 ENCOUNTER — Emergency Department (HOSPITAL_COMMUNITY)
Admission: EM | Admit: 2011-08-11 | Discharge: 2011-08-11 | Disposition: A | Discharge status: Home / Self Care | Payer: No Typology Code available for payment source | Attending: Emergency Medicine | Admitting: Emergency Medicine

## 2011-08-11 DIAGNOSIS — R079 Chest pain, unspecified: Secondary | ICD-10-CM | POA: Insufficient documentation

## 2011-08-11 DIAGNOSIS — I10 Essential (primary) hypertension: Secondary | ICD-10-CM | POA: Insufficient documentation

## 2011-08-11 DIAGNOSIS — S39012A Strain of muscle, fascia and tendon of lower back, initial encounter: Secondary | ICD-10-CM

## 2011-08-11 DIAGNOSIS — R071 Chest pain on breathing: Secondary | ICD-10-CM | POA: Insufficient documentation

## 2011-08-11 DIAGNOSIS — E785 Hyperlipidemia, unspecified: Secondary | ICD-10-CM | POA: Insufficient documentation

## 2011-08-11 DIAGNOSIS — E109 Type 1 diabetes mellitus without complications: Secondary | ICD-10-CM | POA: Insufficient documentation

## 2011-08-11 DIAGNOSIS — Z7982 Long term (current) use of aspirin: Secondary | ICD-10-CM | POA: Insufficient documentation

## 2011-08-11 DIAGNOSIS — M545 Low back pain, unspecified: Secondary | ICD-10-CM | POA: Insufficient documentation

## 2011-08-11 DIAGNOSIS — R0789 Other chest pain: Secondary | ICD-10-CM

## 2011-08-11 DIAGNOSIS — S335XXA Sprain of ligaments of lumbar spine, initial encounter: Secondary | ICD-10-CM | POA: Insufficient documentation

## 2011-08-11 DIAGNOSIS — M542 Cervicalgia: Secondary | ICD-10-CM | POA: Insufficient documentation

## 2011-08-11 DIAGNOSIS — Z79899 Other long term (current) drug therapy: Secondary | ICD-10-CM | POA: Insufficient documentation

## 2011-08-11 MED ORDER — OXYCODONE-ACETAMINOPHEN 5-325 MG PO TABS
2.0000 | ORAL_TABLET | Freq: Once | ORAL | Status: AC
  Administered 2011-08-11: 2 via ORAL
  Filled 2011-08-11: qty 2

## 2011-08-11 MED ORDER — OXYCODONE-ACETAMINOPHEN 5-325 MG PO TABS: ORAL_TABLET | ORAL | Status: DC

## 2011-08-11 NOTE — Assessment & Plan Note (Signed)
Controlled, no change in medication  

## 2011-08-11 NOTE — Discharge Instructions (Signed)
Lumbosacral Strain Lumbosacral strain is one of the most common causes of back pain. There are many causes of back pain. Most are not serious conditions. CAUSES  Your backbone (spinal column) is made up of 24 main vertebral bodies, the sacrum, and the coccyx. These are held together by muscles and tough, fibrous tissue (ligaments). Nerve roots pass through the openings between the vertebrae. A sudden move or injury to the back may cause injury to, or pressure on, these nerves. This may result in localized back pain or pain movement (radiation) into the buttocks, down the leg, and into the foot. Sharp, shooting pain from the buttock down the back of the leg (sciatica) is frequently associated with a ruptured (herniated) disk. Pain may be caused by muscle spasm alone. Your caregiver can often find the cause of your pain by the details of your symptoms and an exam. In some cases, you may need tests (such as X-rays). Your caregiver will work with you to decide if any tests are needed based on your specific exam. HOME CARE INSTRUCTIONS   Avoid an underactive lifestyle. Active exercise, as directed by your caregiver, is your greatest weapon against back pain.   Avoid hard physical activities (tennis, racquetball, waterskiing) if you are not in proper physical condition for it. This may aggravate or create problems.   If you have a back problem, avoid sports requiring sudden body movements. Swimming and walking are generally safer activities.   Maintain good posture.   Avoid becoming overweight (obese).   Use bed rest for only the most extreme, sudden (acute) episode. Your caregiver will help you determine how much bed rest is necessary.   For acute conditions, you may put ice on the injured area.   Put ice in a plastic bag.   Place a towel between your skin and the bag.   Leave the ice on for 15 to 20 minutes at a time, every 2 hours, or as needed.   After you are improved and more active, it  may help to apply heat for 30 minutes before activities.  See your caregiver if you are having pain that lasts longer than expected. Your caregiver can advise appropriate exercises or therapy if needed. With conditioning, most back problems can be avoided. SEEK IMMEDIATE MEDICAL CARE IF:   You have numbness, tingling, weakness, or problems with the use of your arms or legs.   You experience severe back pain not relieved with medicines.   There is a change in bowel or bladder control.   You have increasing pain in any area of the body, including your belly (abdomen).   You notice shortness of breath, dizziness, or feel faint.   You feel sick to your stomach (nauseous), are throwing up (vomiting), or become sweaty.   You notice discoloration of your toes or legs, or your feet get very cold.   Your back pain is getting worse.   You have a fever.  MAKE SURE YOU:   Understand these instructions.   Will watch your condition.   Will get help right away if you are not doing well or get worse.  Document Released: 03/07/2005 Document Revised: 02/07/2011 Document Reviewed: 08/27/2008 [facility] Patient Information 2012 [place].    Narcotic and benzodiazepine use may cause drowsiness, slowed breathing or dependence.  Please use with caution and do not drive, operate machinery or watch young children alone while taking them.  Taking combinations of these medications or drinking alcohol will potentiate these effects.
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
## 2011-08-11 NOTE — Assessment & Plan Note (Signed)
Symptom controlled by medication
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
## 2011-08-11 NOTE — ED Notes (Signed)
Patient transported to X-ray 

## 2011-08-11 NOTE — Assessment & Plan Note (Signed)
Uncontrolled due to recent stress, pt has been increasing her med dose in recent times, but will reduce to prescribed dose
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
## 2011-08-11 NOTE — Assessment & Plan Note (Signed)
Unchanged. Patient re-educated about  the importance of commitment to a  minimum of 150 minutes of exercise per week. The importance of healthy food choices with portion control discussed. Encouraged to start a food diary, count calories and to consider  joining a support group. Sample diet sheets offered. Goals set by the patient for the next several months.    

## 2011-08-11 NOTE — ED Notes (Signed)
Patient is asking if [employer] brought her Nurse, mental health to the nurse desk. Told patient that I haven't seen anyone or anything. Asked other staff. Nurse RN Arnetha Gula called [employer] to find out.

## 2011-08-11 NOTE — Assessment & Plan Note (Signed)
Acute infection, symptomatic treatment

## 2011-08-11 NOTE — ED Notes (Signed)
Patient was ambulated in hallway. Patient did well. Is in Room getting dressed.

## 2011-08-14 ENCOUNTER — Other Ambulatory Visit: Payer: Self-pay | Admitting: Family Medicine

## 2011-08-14 ENCOUNTER — Telehealth: Payer: Self-pay | Admitting: Family Medicine

## 2011-08-14 DIAGNOSIS — M549 Dorsalgia, unspecified: Secondary | ICD-10-CM

## 2011-08-14 NOTE — Telephone Encounter (Signed)
Pt was referred to [facility]. And she is aware that they will call her with appt and time
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
## 2011-08-15 ENCOUNTER — Encounter: Payer: Self-pay | Admitting: Gastroenterology

## 2011-08-16 ENCOUNTER — Encounter: Payer: Self-pay | Admitting: Gastroenterology

## 2011-08-16 ENCOUNTER — Ambulatory Visit (INDEPENDENT_AMBULATORY_CARE_PROVIDER_SITE_OTHER): Payer: Medicare Other | Admitting: Gastroenterology

## 2011-08-16 VITALS — BP 107/69 | HR 104 | Temp 98.2°F | Ht 67.0 in | Wt 203.4 lb

## 2011-08-16 DIAGNOSIS — Z86010 Personal history of colonic polyps: Secondary | ICD-10-CM

## 2011-08-16 MED ORDER — SOD PICOSULFATE-MAG OX-CIT ACD 10-3.5-12 MG-GM-GM PO PACK: 1.0000 | PACK | Freq: Once | ORAL | Status: DC

## 2011-08-16 NOTE — Progress Notes (Signed)
Faxed to PCP
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
## 2011-08-16 NOTE — Progress Notes (Signed)
Addended by: Danny Lawless D on: 08/16/2011 03:45 PM   Modules accepted: Orders

## 2011-08-16 NOTE — Patient Instructions (Signed)
YOU NEED A PREOP APPT PRIOR TO YOUR PROCEDURE. COLONOSCOPY ON APR 2.  FOLLOW UP IN ONE YEAR.
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
## 2011-08-20 ENCOUNTER — Ambulatory Visit (HOSPITAL_COMMUNITY)
Admission: RE | Admit: 2011-08-20 | Discharge: 2011-08-20 | Disposition: A | Discharge status: Home / Self Care | Payer: No Typology Code available for payment source | Source: Ambulatory Visit | Attending: Family Medicine | Admitting: Family Medicine

## 2011-08-20 DIAGNOSIS — E785 Hyperlipidemia, unspecified: Secondary | ICD-10-CM | POA: Insufficient documentation

## 2011-08-20 DIAGNOSIS — M545 Low back pain, unspecified: Secondary | ICD-10-CM | POA: Insufficient documentation

## 2011-08-20 DIAGNOSIS — IMO0001 Reserved for inherently not codable concepts without codable children: Secondary | ICD-10-CM | POA: Insufficient documentation

## 2011-08-20 DIAGNOSIS — M6281 Muscle weakness (generalized): Secondary | ICD-10-CM | POA: Insufficient documentation

## 2011-08-20 DIAGNOSIS — E119 Type 2 diabetes mellitus without complications: Secondary | ICD-10-CM | POA: Insufficient documentation

## 2011-08-20 DIAGNOSIS — I10 Essential (primary) hypertension: Secondary | ICD-10-CM | POA: Insufficient documentation

## 2011-08-20 DIAGNOSIS — M546 Pain in thoracic spine: Secondary | ICD-10-CM | POA: Insufficient documentation
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
## 2011-08-20 NOTE — Patient Instructions (Addendum)
given

## 2011-08-21 ENCOUNTER — Encounter: Payer: Self-pay | Admitting: Gastroenterology

## 2011-08-21 ENCOUNTER — Telehealth: Payer: Self-pay | Admitting: Family Medicine

## 2011-08-21 NOTE — Telephone Encounter (Signed)
Her pain medication from the hospital has ran out and her back hurts when she sit, lays and walks, hurts down left leg also. Wants to come in for pain injection

## 2011-08-22 NOTE — Telephone Encounter (Signed)
[place] nurse visit only for toradol 60mg  and depo medrol 80mg  iM for pain, pls arrange a suitabl;e time with scheduling

## 2011-08-23 ENCOUNTER — Ambulatory Visit (INDEPENDENT_AMBULATORY_CARE_PROVIDER_SITE_OTHER): Payer: Medicare Other

## 2011-08-23 ENCOUNTER — Ambulatory Visit (HOSPITAL_COMMUNITY)
Admission: RE | Admit: 2011-08-23 | Discharge: 2011-08-23 | Disposition: A | Discharge status: Home / Self Care | Payer: No Typology Code available for payment source | Source: Ambulatory Visit | Attending: Family Medicine | Admitting: Family Medicine

## 2011-08-23 VITALS — BP 130/90 | Wt 204.0 lb

## 2011-08-23 DIAGNOSIS — M549 Dorsalgia, unspecified: Secondary | ICD-10-CM

## 2011-08-23 NOTE — Progress Notes (Signed)
Physical Therapy Treatment Patient Details  Name: Cheryl Abbott MRN: [id number] Date of Birth: 1946/03/26  Today's Date: 08/23/2011 Time: 1345-1430 Time Calculation (min): 45 min Visit#: 2  of 12   Re-eval: 09/19/11 Charges: Therex x 40'  Subjective: Symptoms/Limitations Symptoms: My back feels so stiff. Pain Assessment Currently in Pain?: Yes Pain Score:   5 Pain Location: Back Pain Orientation: Left;Upper;Mid;Lower   Exercise/Treatments Stretches Active Hamstring Stretch: 3 reps;30 seconds Single Knee to Chest Stretch: 3 reps;30 seconds Lower Trunk Rotation: 5 reps Stability Clam: 10 reps;Supine Bridge: 5 reps;3 seconds Bent Knee Raise: 5 reps Ab Set: 10 reps;5 seconds Straight Leg Raise: 10 reps;Supine Wall Slides: Limitations Wall Slides Limitations: Glute set x 10  Physical Therapy Assessment and Plan PT Assessment and Plan Clinical Impression Statement: Pt completes all exercises well after using for form. Pt does struggle with maintaining abdominal control with exercises and requires frequent cueing to facilitate abdominal contraction. Pt reports pain decrease to 4/10 at end of session. Pt appears to ambulate better at end of session. PT Plan: Continue to progress per PT POC.     Problem List Patient Active Problem List  Diagnoses  . 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 DIABETES MELLITUS, TYPE II  . Hyperlipidemia LDL goal < 70  . OBESITY, UNSPECIFIED  . ANEMIA  . ANXIETY DISORDER  . Hypertension goal BP (blood pressure) < 130/80  . HEMORRHOIDS  . GERD  . DYSPEPSIA  . FATTY LIVER DISEASE  . PYELONEPHRITIS  . RENAL CALCULUS  . ACUTE CYSTITIS  . BACK PAIN  . FOOT PAIN  . ORTHOSTATIC DIZZINESS  . SLEEP APNEA  . FATIGUE  . CHEST PAIN UNSPECIFIED  . NAUSEA AND VOMITING  . COLONIC POLYPS, ADENOMATOUS, HX OF  . UNSPECIFIED DERMATOMYCOSIS  . CERVICAL LYMPHADENITIS  . LIPOMA  . LYMPHADENOPATHY  . Hypercalcemia  . Nausea  . Muscle  weakness (generalized)  . Thoracic or lumbosacral neuritis or radiculitis, unspecified  . Conjunctivitis  . Costochondritis, acute    PT - End of Session Activity Tolerance: Patient tolerated treatment well General Behavior During Session: [facility] for tasks performed Cognition: [facility] for tasks performed   Rachelle Hora, PTA 08/23/2011, 3:07 PM

## 2011-08-23 NOTE — Telephone Encounter (Signed)
Has appt for 3:15

## 2011-08-23 NOTE — Progress Notes (Signed)
Reminder in epic to follow up in one year

## 2011-08-24 DIAGNOSIS — M549 Dorsalgia, unspecified: Secondary | ICD-10-CM

## 2011-08-24 MED ORDER — METHYLPREDNISOLONE ACETATE PF 80 MG/ML IJ SUSP
80.0000 mg | Freq: Once | INTRAMUSCULAR | Status: AC
  Administered 2011-08-24: 80 mg via INTRAMUSCULAR

## 2011-08-24 MED ORDER — KETOROLAC TROMETHAMINE 60 MG/2ML IM SOLN
60.0000 mg | Freq: Once | INTRAMUSCULAR | Status: AC
  Administered 2011-08-24: 60 mg via INTRAMUSCULAR

## 2011-08-24 NOTE — Progress Notes (Signed)
Pt received toradol 60 and depo 80 per Dr Moshe Cipro

## 2011-08-25 ENCOUNTER — Other Ambulatory Visit: Payer: Self-pay | Admitting: Family Medicine

## 2011-08-28 ENCOUNTER — Telehealth (HOSPITAL_COMMUNITY): Payer: Self-pay

## 2011-08-28 ENCOUNTER — Ambulatory Visit (HOSPITAL_COMMUNITY): Payer: No Typology Code available for payment source | Admitting: *Deleted

## 2011-08-29 ENCOUNTER — Ambulatory Visit (HOSPITAL_COMMUNITY)
Admission: RE | Admit: 2011-08-29 | Discharge: 2011-08-29 | Disposition: A | Discharge status: Home / Self Care | Payer: No Typology Code available for payment source | Source: Ambulatory Visit | Attending: Family Medicine | Admitting: Family Medicine

## 2011-08-29 NOTE — Progress Notes (Signed)
Physical Therapy Treatment Patient Details  Name: Cheryl Abbott MRN: [id number] Date of Birth: 1946/03/03  Today's Date: 08/29/2011 Time: [id number] Time Calculation (min): 48 min Visit#: 3  of 12   Re-eval: 09/19/11 Charges;  therex 28', MHP 25'    Subjective: Symptoms/Limitations Symptoms: Pt. requested MHP today with therex, states she is hurting bad today. Very slow, labored/antalgic gait today. Pain Assessment Currently in Pain?: Yes Pain Score:   6 Pain Location: Back Pain Orientation: Right;Left;Mid;Lower Pain Radiating Towards: Patient states her pain radiates into both hips   Exercise/Treatments Stretches Active Hamstring Stretch: 3 reps;30 seconds Single Knee to Chest Stretch: 3 reps;30 seconds Lower Trunk Rotation: Limitations Lower Trunk Rotation Limitations: 10 reps, 5 second holds Stability Clam: 10 reps Bridge: 5 seconds;5 reps Bent Knee Raise: 5 reps Ab Set: 10 reps;5 seconds;Limitations AB Set Limitations: pelvic floor 10 reps, 3 sec holds Straight Leg Raise: 10 reps;Supine;Limitations Straight Leg Raises Limitations: 5 reps R with AAROM Wall Slides: Limitations Wall Slides Limitations: Glute set x 10   Modalities Modalities: Moist Heat Moist Heat Therapy Number Minutes Moist Heat: 25 Minutes Moist Heat Location:  (Low back supine)  Physical Therapy Assessment and Plan PT Assessment and Plan Clinical Impression Statement: Pt. very slow with mobility today. It took 6' to ambulate to treatment room and get postioned supine.  Pt. requested to hold wallslides.  Did not begin any new activities today and added MHP to supine therex. Poor control with SLR and unable to perform R SLR without assist, 5 reps only.  Pt. required frequent cueing to for stabilization with therex.   Pt. reported slight pain reduction at end of session 1-2 levels. PT Plan: Continue to progress core stability and strength while decreasing pain.     Problem List Patient Active  Problem List  Diagnoses  . 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 DIABETES MELLITUS, TYPE II  . Hyperlipidemia LDL goal < 70  . OBESITY, UNSPECIFIED  . ANEMIA  . ANXIETY DISORDER  . Hypertension goal BP (blood pressure) < 130/80  . HEMORRHOIDS  . GERD  . DYSPEPSIA  . FATTY LIVER DISEASE  . PYELONEPHRITIS  . RENAL CALCULUS  . ACUTE CYSTITIS  . BACK PAIN  . FOOT PAIN  . ORTHOSTATIC DIZZINESS  . SLEEP APNEA  . FATIGUE  . CHEST PAIN UNSPECIFIED  . NAUSEA AND VOMITING  . COLONIC POLYPS, ADENOMATOUS, HX OF  . UNSPECIFIED DERMATOMYCOSIS  . CERVICAL LYMPHADENITIS  . LIPOMA  . LYMPHADENOPATHY  . Hypercalcemia  . Nausea  . Muscle weakness (generalized)  . Thoracic or lumbosacral neuritis or radiculitis, unspecified  . Conjunctivitis  . Costochondritis, acute    PT - End of Session Activity Tolerance: Patient tolerated treatment well General Behavior During Session: [facility] for tasks performed Cognition: [facility] for tasks performed    Treyvion Durkee B. Mare Ferrari, PTA 08/29/2011, 2:34 PM

## 2011-08-30 ENCOUNTER — Ambulatory Visit (HOSPITAL_COMMUNITY)
Admission: RE | Admit: 2011-08-30 | Discharge: 2011-08-30 | Disposition: A | Discharge status: Home / Self Care | Payer: No Typology Code available for payment source | Source: Ambulatory Visit | Attending: Family Medicine | Admitting: Family Medicine

## 2011-08-30 NOTE — Progress Notes (Signed)
Physical Therapy Treatment Patient Details  Name: Cheryl Abbott MRN: [id number] Date of Birth: [birth year]  Today's Date: 08/30/2011 Time: [id number] Time Calculation (min): 43 min Visit#: 4  of 12   Re-eval: 09/19/11 Charges: Therex x 38' w/ MHP  Subjective: Symptoms/Limitations Symptoms: Pt states that the tightness was better after last session but it returned that night. Pain Assessment Currently in Pain?: Yes Pain Score:   6 Pain Location: Back Pain Orientation: Right;Left;Mid;Lower   Exercise/Treatments Stretches Active Hamstring Stretch: 3 reps;30 seconds Single Knee to Chest Stretch: 3 reps;30 seconds Lower Trunk Rotation: Limitations Lower Trunk Rotation Limitations: 10 reps, 5 second holds Stability Clam: 10 reps Bridge: 10 reps;3 seconds Bent Knee Raise: 5 reps Ab Set: 10 reps;5 seconds;Limitations AB Set Limitations: pelvic floor 10 reps, 3 sec holds Straight Leg Raise: 10 reps;Supine;Limitations Wall Slides Limitations: Glute set x 10  Physical Therapy Assessment and Plan PT Assessment and Plan Clinical Impression Statement: Pt continues to present with slow labored movements this session. Pt also continues to be limited by pain but does tolerate increase in reps with certain exercises. No progression to more difficult exercises as pt need continued practice with exercises she has completed during previous tx. Tx focus on abdominal control. PT Plan: Continue to progress as pain allows.     Problem List Patient Active Problem List  Diagnoses  . 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 DIABETES MELLITUS, TYPE II  . Hyperlipidemia LDL goal < 70  . OBESITY, UNSPECIFIED  . ANEMIA  . ANXIETY DISORDER  . Hypertension goal BP (blood pressure) < 130/80  . HEMORRHOIDS  . GERD  . DYSPEPSIA  . FATTY LIVER DISEASE  . PYELONEPHRITIS  . RENAL CALCULUS  . ACUTE CYSTITIS  . BACK PAIN  . FOOT PAIN  . ORTHOSTATIC DIZZINESS  . SLEEP APNEA  . FATIGUE  .  CHEST PAIN UNSPECIFIED  . NAUSEA AND VOMITING  . COLONIC POLYPS, ADENOMATOUS, HX OF  . UNSPECIFIED DERMATOMYCOSIS  . CERVICAL LYMPHADENITIS  . LIPOMA  . LYMPHADENOPATHY  . Hypercalcemia  . Nausea  . Muscle weakness (generalized)  . Thoracic or lumbosacral neuritis or radiculitis, unspecified  . Conjunctivitis  . Costochondritis, acute    PT - End of Session Activity Tolerance: Patient tolerated treatment well General Behavior During Session: [facility] for tasks performed Cognition: [facility] for tasks performed    Rachelle Hora, PTA 08/30/2011, 1:48 PM

## 2011-09-01 ENCOUNTER — Other Ambulatory Visit: Payer: Self-pay | Admitting: Family Medicine

## 2011-09-04 ENCOUNTER — Ambulatory Visit (HOSPITAL_COMMUNITY)
Admission: RE | Admit: 2011-09-04 | Discharge: 2011-09-04 | Disposition: A | Discharge status: Home / Self Care | Payer: No Typology Code available for payment source | Source: Ambulatory Visit | Attending: Family Medicine | Admitting: Family Medicine

## 2011-09-04 NOTE — Patient Instructions (Addendum)
[place]  09/04/2011   Your procedure is scheduled on:  September 11, 2011  Report to [facility] at 6:15 AM.  Call this number if you have problems the morning of surgery: [phone]   Remember:   Do not drink or eat food:After Midnight.    Clear liquids include soda, tea, black coffee, apple or grape juice, broth.  Take these medicines the morning of surgery with A SIP OF WATER: Norvasc, Prilosec   Do not wear jewelry, make-up or nail polish.  Do not wear lotions, powders, or perfumes. You may wear deodorant.  Do not shave 48 hours prior to surgery.  Do not bring valuables to the hospital.  Contacts, dentures or bridgework may not be worn into surgery.  Leave suitcase in the car. After surgery it may be brought to your room.  For patients admitted to the hospital, checkout time is 11:00 AM the day of discharge.   Patients discharged the day of surgery will not be allowed to drive home.  Name and phone number of your driver:   Special Instructions: N/A   Please read over the following fact sheets that you were given: Pain Booklet, Anesthesia Post-op Instructions and Care and Recovery After Surgery

## 2011-09-04 NOTE — Progress Notes (Signed)
Physical Therapy Treatment Patient Details  Name: Cheryl Abbott MRN: [id number] Date of Birth: 08/13/1945  Today's Date: 09/04/2011 Time: [id number] Time Calculation (min): 42 min Visit#: 5  of 12   Re-eval: 09/19/11 Charges: Therex x 39'  Subjective: Symptoms/Limitations Symptoms: Pt states that she is no longer having pain in her low back and butttocks. She reports her pain has moved up her back. Pain Assessment Currently in Pain?: Yes Pain Score:   4 Pain Location: Back Pain Orientation: Upper   Exercise/Treatments Stretches Active Hamstring Stretch: 3 reps;30 seconds Single Knee to Chest Stretch: 3 reps;30 seconds Lower Trunk Rotation: Limitations Lower Trunk Rotation Limitations: 10 reps, 5 second holds Stability Bridge: 10 reps;3 seconds Ab Set: 10 reps;5 seconds;Limitations AB Set Limitations: pelvic floor 10 reps, 3 sec holds Machine Exercises Tread Mill: [id number] to increase general movement and endurance   Physical Therapy Assessment and Plan PT Assessment and Plan Clinical Impression Statement: Pt continues to require frequent cueing to facilitate abdominal stabilization. Pt presents with weakness in TA during pelvic floor exercises. PT does complete SL clams well with good glute med contraction.  Pt reports no change in pain at end of session. PT Plan: Continue to progress per PT POC.     Problem List Patient Active Problem List  Diagnoses  . 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 DIABETES MELLITUS, TYPE II  . Hyperlipidemia LDL goal < 70  . OBESITY, UNSPECIFIED  . ANEMIA  . ANXIETY DISORDER  . Hypertension goal BP (blood pressure) < 130/80  . HEMORRHOIDS  . GERD  . DYSPEPSIA  . FATTY LIVER DISEASE  . PYELONEPHRITIS  . RENAL CALCULUS  . ACUTE CYSTITIS  . BACK PAIN  . FOOT PAIN  . ORTHOSTATIC DIZZINESS  . SLEEP APNEA  . FATIGUE  . CHEST PAIN UNSPECIFIED  . NAUSEA AND VOMITING  . COLONIC POLYPS, ADENOMATOUS, HX OF  . UNSPECIFIED  DERMATOMYCOSIS  . CERVICAL LYMPHADENITIS  . LIPOMA  . LYMPHADENOPATHY  . Hypercalcemia  . Nausea  . Muscle weakness (generalized)  . Thoracic or lumbosacral neuritis or radiculitis, unspecified  . Conjunctivitis  . Costochondritis, acute    PT - End of Session Activity Tolerance: Patient tolerated treatment well General Behavior During Session: [facility] for tasks performed Cognition: [facility] for tasks performed    Rachelle Hora, PTA 09/04/2011, 4:16 PM

## 2011-09-05 ENCOUNTER — Encounter (HOSPITAL_COMMUNITY): Payer: Self-pay | Admitting: Pharmacy Technician

## 2011-09-05 ENCOUNTER — Encounter (HOSPITAL_COMMUNITY): Payer: Self-pay

## 2011-09-05 ENCOUNTER — Other Ambulatory Visit: Payer: Self-pay

## 2011-09-05 ENCOUNTER — Encounter (HOSPITAL_COMMUNITY)
Admission: RE | Admit: 2011-09-05 | Discharge: 2011-09-05 | Disposition: A | Discharge status: Home / Self Care | Payer: Medicare Other | Source: Ambulatory Visit | Attending: Gastroenterology | Admitting: Gastroenterology

## 2011-09-05 LAB — BASIC METABOLIC PANEL
BUN: 23 mg/dL (ref 6–23)
CO2: 31 mEq/L (ref 19–32)
Calcium: 10.8 mg/dL — ABNORMAL HIGH (ref 8.4–10.5)
Chloride: 95 mEq/L — ABNORMAL LOW (ref 96–112)
Creatinine, Ser: 0.99 mg/dL (ref 0.50–1.10)
GFR calc Af Amer: 67 mL/min — ABNORMAL LOW (ref >90)
GFR calc non Af Amer: 58 mL/min — ABNORMAL LOW (ref >90)
Glucose, Bld: 70 mg/dL (ref 70–99)
Potassium: 4.3 mEq/L (ref 3.5–5.1)
Sodium: 137 mEq/L (ref 135–145)

## 2011-09-05 LAB — CBC
HCT: 32.5 % — ABNORMAL LOW (ref 36.0–46.0)
Hemoglobin: 9.6 g/dL — ABNORMAL LOW (ref 12.0–15.0)
MCH: 20.8 pg — ABNORMAL LOW (ref 26.0–34.0)
MCHC: 29.5 g/dL — ABNORMAL LOW (ref 30.0–36.0)
MCV: 70.3 fL — ABNORMAL LOW (ref 78.0–100.0)
Platelets: 474 10*3/uL — ABNORMAL HIGH (ref 150–400)
RBC: 4.62 MIL/uL (ref 3.87–5.11)
RDW: 20.3 % — ABNORMAL HIGH (ref 11.5–15.5)
WBC: 8.4 10*3/uL (ref 4.0–10.5)

## 2011-09-06 ENCOUNTER — Other Ambulatory Visit: Payer: Self-pay

## 2011-09-06 ENCOUNTER — Ambulatory Visit (HOSPITAL_COMMUNITY)
Admission: RE | Admit: 2011-09-06 | Discharge: 2011-09-06 | Disposition: A | Discharge status: Home / Self Care | Payer: No Typology Code available for payment source | Source: Ambulatory Visit | Attending: Family Medicine | Admitting: Family Medicine

## 2011-09-06 ENCOUNTER — Telehealth: Payer: Self-pay | Admitting: Family Medicine

## 2011-09-06 MED ORDER — ALPRAZOLAM 0.5 MG PO TABS: 0.5000 mg | ORAL_TABLET | Freq: Two times a day (BID) | ORAL | Status: DC

## 2011-09-06 NOTE — Progress Notes (Signed)
Physical Therapy Treatment Patient Details  Name: Cheryl Abbott MRN: [id number] Date of Birth: 07/27/1945  Today's Date: 09/06/2011 Time: [id number] Time Calculation (min): 47 min Visit#: 6  of 12   Re-eval: 09/19/11 Charges: Therex x 40'   Subjective: Symptoms/Limitations Symptoms: Pt states that she feels like she is moving better. Pain Assessment Currently in Pain?: Yes Pain Score:   4 Pain Location: Back Pain Orientation: Lower   Exercise/Treatments Stretches Lower Trunk Rotation: Limitations Lower Trunk Rotation Limitations: 10 reps, 5 second holds Stability Clam: 10 reps;Supine Bridge: 10 reps;3 seconds Ab Set: Limitations AB Set Limitations: pelvic floor 10 reps, 3 sec holds Straight Leg Raise: 10 reps;Supine Machine Exercises Tread Mill: 5' to increase general movement and endurance  Modalities Modalities: Moist Heat Moist Heat Therapy Number Minutes Moist Heat: 20 Minutes (with supine exercises) Moist Heat Location: Other (comment) (Low back supine)  Physical Therapy Assessment and Plan PT Assessment and Plan Clinical Impression Statement: Pt presents with improved activity tolerance this session. Pt able to complete TM for 5' this session. Pt ambulates into therapy with increased speed and decreased guarding. MHP applied to lumbar during supine exercises to decrease tightness and pain. Pt reports pain decrease t o3/10 at end of session. PT Plan: Continue to progress per PT POC     Problem List Patient Active Problem List  Diagnoses  . 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 DIABETES MELLITUS, TYPE II  . Hyperlipidemia LDL goal < 70  . OBESITY, UNSPECIFIED  . ANEMIA  . ANXIETY DISORDER  . Hypertension goal BP (blood pressure) < 130/80  . HEMORRHOIDS  . GERD  . DYSPEPSIA  . FATTY LIVER DISEASE  . PYELONEPHRITIS  . RENAL CALCULUS  . ACUTE CYSTITIS  . BACK PAIN  . FOOT PAIN  . ORTHOSTATIC DIZZINESS  . SLEEP APNEA  . FATIGUE  . CHEST  PAIN UNSPECIFIED  . NAUSEA AND VOMITING  . COLONIC POLYPS, ADENOMATOUS, HX OF  . UNSPECIFIED DERMATOMYCOSIS  . CERVICAL LYMPHADENITIS  . LIPOMA  . LYMPHADENOPATHY  . Hypercalcemia  . Nausea  . Muscle weakness (generalized)  . Thoracic or lumbosacral neuritis or radiculitis, unspecified  . Conjunctivitis  . Costochondritis, acute    PT - End of Session Activity Tolerance: Patient tolerated treatment well General Behavior During Session: [facility] for tasks performed Cognition: [facility] for tasks performed    Rachelle Hora, PTA 09/06/2011, 2:48 PM

## 2011-09-06 NOTE — Telephone Encounter (Signed)
rx printed and awaiting signature.
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
## 2011-09-10 NOTE — Interval H&P Note (Signed)
History and Physical Interval Note:  09/10/2011 9:33 PM  [name]  has presented for surgery, with the diagnosis of hx of polyps  The various methods of treatment have been discussed with the patient. After consideration of risks, benefits and other options for treatment, the patient has consented to  Procedure(s) (LRB): COLONOSCOPY WITH PROPOFOL (N/A) as a surgical intervention .  The patients' history has been reviewed, patient examined.  I have reviewed the patients' chart and labs.  Questions were answered to the patient's satisfaction.     [employer]

## 2011-09-11 ENCOUNTER — Ambulatory Visit (HOSPITAL_COMMUNITY): Payer: Medicare Other | Admitting: Anesthesiology

## 2011-09-11 ENCOUNTER — Encounter (HOSPITAL_COMMUNITY): Payer: Self-pay | Admitting: *Deleted

## 2011-09-11 ENCOUNTER — Ambulatory Visit (HOSPITAL_COMMUNITY): Payer: No Typology Code available for payment source | Admitting: *Deleted

## 2011-09-11 ENCOUNTER — Ambulatory Visit (HOSPITAL_COMMUNITY)
Admission: RE | Admit: 2011-09-11 | Discharge: 2011-09-11 | Disposition: A | Discharge status: Home / Self Care | Payer: Medicare Other | Source: Ambulatory Visit | Attending: Gastroenterology | Admitting: Gastroenterology

## 2011-09-11 ENCOUNTER — Encounter (HOSPITAL_COMMUNITY): Payer: Self-pay | Admitting: Anesthesiology

## 2011-09-11 ENCOUNTER — Encounter (HOSPITAL_COMMUNITY)
Admission: RE | Disposition: A | Discharge status: Home / Self Care | Payer: Self-pay | Source: Ambulatory Visit | Attending: Gastroenterology

## 2011-09-11 DIAGNOSIS — I10 Essential (primary) hypertension: Secondary | ICD-10-CM | POA: Insufficient documentation

## 2011-09-11 DIAGNOSIS — Z79899 Other long term (current) drug therapy: Secondary | ICD-10-CM | POA: Insufficient documentation

## 2011-09-11 DIAGNOSIS — Z01812 Encounter for preprocedural laboratory examination: Secondary | ICD-10-CM | POA: Insufficient documentation

## 2011-09-11 DIAGNOSIS — Z860100 Personal history of colon polyps, unspecified: Secondary | ICD-10-CM | POA: Insufficient documentation

## 2011-09-11 DIAGNOSIS — K648 Other hemorrhoids: Secondary | ICD-10-CM | POA: Insufficient documentation

## 2011-09-11 DIAGNOSIS — E785 Hyperlipidemia, unspecified: Secondary | ICD-10-CM | POA: Insufficient documentation

## 2011-09-11 DIAGNOSIS — E119 Type 2 diabetes mellitus without complications: Secondary | ICD-10-CM | POA: Insufficient documentation

## 2011-09-11 HISTORY — PX: FLEXIBLE SIGMOIDOSCOPY: SHX5431

## 2011-09-11 LAB — GLUCOSE, CAPILLARY: Glucose-Capillary: 125 mg/dL — ABNORMAL HIGH (ref 70–99)

## 2011-09-11 SURGERY — SIGMOIDOSCOPY, FLEXIBLE
Anesthesia: Monitor Anesthesia Care | Site: Rectum

## 2011-09-11 MED ORDER — FENTANYL CITRATE 0.05 MG/ML IJ SOLN: 25.0000 ug | INTRAMUSCULAR | Status: DC | PRN

## 2011-09-11 MED ORDER — MIDAZOLAM HCL 2 MG/2ML IJ SOLN
INTRAMUSCULAR | Status: AC
  Filled 2011-09-11: qty 2

## 2011-09-11 MED ORDER — MIDAZOLAM HCL 2 MG/2ML IJ SOLN
INTRAMUSCULAR | Status: AC
  Administered 2011-09-11: 2 mg via INTRAVENOUS
  Filled 2011-09-11: qty 2

## 2011-09-11 MED ORDER — ONDANSETRON HCL 4 MG/2ML IJ SOLN
4.0000 mg | Freq: Once | INTRAMUSCULAR | Status: AC
  Administered 2011-09-11: 4 mg via INTRAVENOUS

## 2011-09-11 MED ORDER — ONDANSETRON HCL 4 MG/2ML IJ SOLN
INTRAMUSCULAR | Status: AC
  Administered 2011-09-11: 4 mg via INTRAVENOUS
  Filled 2011-09-11: qty 2

## 2011-09-11 MED ORDER — PROPOFOL 10 MG/ML IV EMUL
INTRAVENOUS | Status: AC
  Filled 2011-09-11: qty 20

## 2011-09-11 MED ORDER — LACTATED RINGERS IV SOLN: INTRAVENOUS | Status: DC

## 2011-09-11 MED ORDER — PROPOFOL 10 MG/ML IV EMUL
INTRAVENOUS | Status: DC | PRN
  Administered 2011-09-11: 50 ug/kg/min via INTRAVENOUS

## 2011-09-11 MED ORDER — LIDOCAINE HCL 1 % IJ SOLN
INTRAMUSCULAR | Status: DC | PRN
  Administered 2011-09-11: 30 mg via INTRADERMAL

## 2011-09-11 MED ORDER — ONDANSETRON HCL 4 MG/2ML IJ SOLN: 4.0000 mg | Freq: Once | INTRAMUSCULAR | Status: DC | PRN

## 2011-09-11 MED ORDER — MIDAZOLAM HCL 5 MG/5ML IJ SOLN
INTRAMUSCULAR | Status: DC | PRN
  Administered 2011-09-11: 2 mg via INTRAVENOUS

## 2011-09-11 MED ORDER — STERILE WATER FOR IRRIGATION IR SOLN
Status: DC | PRN
  Administered 2011-09-11: 08:00:00

## 2011-09-11 MED ORDER — GLYCOPYRROLATE 0.2 MG/ML IJ SOLN
INTRAMUSCULAR | Status: AC
  Administered 2011-09-11: 0.2 mg via INTRAVENOUS
  Filled 2011-09-11: qty 1

## 2011-09-11 MED ORDER — LIDOCAINE HCL (PF) 1 % IJ SOLN
INTRAMUSCULAR | Status: AC
  Filled 2011-09-11: qty 5

## 2011-09-11 MED ORDER — LACTATED RINGERS IV SOLN
INTRAVENOUS | Status: DC
  Administered 2011-09-11: 1000 mL via INTRAVENOUS

## 2011-09-11 MED ORDER — GLYCOPYRROLATE 0.2 MG/ML IJ SOLN
0.2000 mg | Freq: Once | INTRAMUSCULAR | Status: AC
  Administered 2011-09-11: 0.2 mg via INTRAVENOUS

## 2011-09-11 MED ORDER — MIDAZOLAM HCL 2 MG/2ML IJ SOLN
1.0000 mg | INTRAMUSCULAR | Status: DC | PRN
  Administered 2011-09-11: 2 mg via INTRAVENOUS

## 2011-09-11 SURGICAL SUPPLY — 22 items
ELECT REM PT RETURN 9FT ADLT (ELECTROSURGICAL)
ELECTRODE REM PT RTRN 9FT ADLT (ELECTROSURGICAL) IMPLANT
FCP BXJMBJMB 240X2.8X (CUTTING FORCEPS)
FLOOR PAD 36X40 (MISCELLANEOUS) ×3
FORCEPS BIOP RAD 4 LRG CAP 4 (CUTTING FORCEPS) IMPLANT
FORCEPS BIOP RJ4 240 W/NDL (CUTTING FORCEPS)
FORCEPS BXJMBJMB 240X2.8X (CUTTING FORCEPS) IMPLANT
INJECTOR/SNARE I SNARE (MISCELLANEOUS) IMPLANT
LUBRICANT JELLY 4.5OZ STERILE (MISCELLANEOUS) ×2 IMPLANT
MANIFOLD NEPTUNE II (INSTRUMENTS) ×2 IMPLANT
NDL SCLEROTHERAPY 25GX240 (NEEDLE) IMPLANT
NEEDLE SCLEROTHERAPY 25GX240 (NEEDLE) IMPLANT
PAD FLOOR 36X40 (MISCELLANEOUS) ×2 IMPLANT
PROBE APC STR FIRE (PROBE) IMPLANT
PROBE INJECTION GOLD (MISCELLANEOUS)
PROBE INJECTION GOLD 7FR (MISCELLANEOUS) IMPLANT
SNARE SHORT THROW 13M SML OVAL (MISCELLANEOUS) ×3 IMPLANT
SYR 50ML LL SCALE MARK (SYRINGE) ×2 IMPLANT
TRAP SPECIMEN MUCOUS 40CC (MISCELLANEOUS) ×2 IMPLANT
TUBING ENDO SMARTCAP PENTAX (MISCELLANEOUS) ×2 IMPLANT
TUBING IRRIGATION ENDOGATOR (MISCELLANEOUS) ×2 IMPLANT
WATER STERILE IRR 1000ML POUR (IV SOLUTION) ×2 IMPLANT

## 2011-09-11 NOTE — Interval H&P Note (Signed)
History and Physical Interval Note:  09/11/2011 7:31 AM  Cheryl Abbott  has presented today for surgery, with the diagnosis of hx of polyps  The various methods of treatment have been discussed with the patient and family. After consideration of risks, benefits and other options for treatment, the patient has consented to  Procedure(s) (LRB): COLONOSCOPY WITH PROPOFOL (N/A) as a surgical intervention .  The patients' history has been reviewed, patient examined, no change in status, stable for surgery.  I have reviewed the patients' chart and labs.  Questions were answered to the patient's satisfaction.     [employer]

## 2011-09-11 NOTE — Anesthesia Postprocedure Evaluation (Signed)
  Anesthesia Post-op Note  Patient: Cheryl Abbott  Procedure(s) Performed: Procedure(s) (LRB): FLEXIBLE SIGMOIDOSCOPY (N/A)  Patient Location: PACU  Anesthesia Type: MAC  Level of Consciousness: awake, alert , oriented and patient cooperative  Airway and Oxygen Therapy: Patient Spontanous Breathing  Post-op Pain: none  Post-op Assessment: Post-op Vital signs reviewed, Patient's Cardiovascular Status Stable, Respiratory Function Stable, Patent Airway and No signs of Nausea or vomiting  Post-op Vital Signs: Reviewed and stable  Complications: No apparent anesthesia complications

## 2011-09-11 NOTE — Anesthesia Preprocedure Evaluation (Signed)
Anesthesia Evaluation    Airway Mallampati: II  Neck ROM: Full    Dental  (+) Teeth Intact   Pulmonary sleep apnea , former smoker breath sounds clear to auscultation        Cardiovascular hypertension, Pt. on medications + CAD Rhythm:Regular     Neuro/Psych PSYCHIATRIC DISORDERS Anxiety    GI/Hepatic GERD-  Medicated and Controlled,  Endo/Other  Diabetes mellitus-, Well Controlled, Type 2, Oral Hypoglycemic Agents  Renal/GU      Musculoskeletal   Abdominal   Peds  Hematology   Anesthesia Other Findings   Reproductive/Obstetrics                           Anesthesia Physical Anesthesia Plan  ASA: III  Anesthesia Plan: MAC   Post-op Pain Management:    Induction: Intravenous  Airway Management Planned: Simple Face Mask  Additional Equipment:   Intra-op Plan:   Post-operative Plan:   Informed Consent: I have reviewed the patients History and Physical, chart, labs and discussed the procedure including the risks, benefits and alternatives for the proposed anesthesia with the patient or authorized representative who has indicated his/her understanding and acceptance.     Plan Discussed with:   Anesthesia Plan Comments:         Anesthesia Quick Evaluation

## 2011-09-11 NOTE — Discharge Instructions (Signed)
Your prep was not good. I WAS ONLY ABLE TO LOOK AT THE LAST THIRD OF YOUR COLON.  RETURN FOR COLONOSCOPY ON APR 23. YOU NEED 2 DAYS OF BOWEL PREP AND CLEAR LIQUIDS.  SIGMOIDOSCOPY Care After Read the instructions outlined below and refer to this sheet in the next week. These discharge instructions provide you with general information on caring for yourself after you leave the hospital. While your treatment has been planned according to the most current medical practices available, unavoidable complications occasionally occur. If you have any problems or questions after discharge, call DR. Jannice Beitzel, [phone].  ACTIVITY  You may resume your regular activity, but move at a slower pace for the next 24 hours.   Take frequent rest periods for the next 24 hours.   Walking will help get rid of the air and reduce the bloated feeling in your belly (abdomen).   No driving for 24 hours (because of the medicine (anesthesia) used during the test).   You may shower.   Do not sign any important legal documents or operate any machinery for 24 hours (because of the anesthesia used during the test).    NUTRITION  Drink plenty of fluids.   You may resume your normal diet as instructed by your doctor.   Begin with a light meal and progress to your normal diet. Heavy or fried foods are harder to digest and may make you feel sick to your stomach (nauseated).   Avoid alcoholic beverages for 24 hours or as instructed.    MEDICATIONS  You may resume your normal medications.   WHAT YOU CAN EXPECT TODAY  Some feelings of bloating in the abdomen.   Passage of more gas than usual.   Spotting of blood in your stool or on the toilet paper  .  IF YOU HAD POLYPS REMOVED DURING THE COLONOSCOPY:  Eat a soft diet IF YOU HAVE NAUSEA, BLOATING, ABDOMINAL PAIN, OR VOMITING.    FINDING OUT THE RESULTS OF YOUR TEST Not all test results are available during your visit. DR. Darrick Penna WILL CALL YOU WITHIN 7  DAYS OF YOUR PROCEDUE WITH YOUR RESULTS. Do not assume everything is normal if you have not heard from DR. Lochlan Grygiel IN ONE WEEK, CALL HER OFFICE AT [phone].  SEEK IMMEDIATE MEDICAL ATTENTION AND CALL THE OFFICE: [phone] IF:  You have more than a spotting of blood in your stool.   Your belly is swollen (abdominal distention).   You are nauseated or vomiting.   You have a temperature over 101F.   You have abdominal pain or discomfort that is severe or gets worse throughout the day.

## 2011-09-11 NOTE — Transfer of Care (Signed)
Immediate Anesthesia Transfer of Care Note  Patient: Cheryl Abbott  Procedure(s) Performed: Procedure(s) (LRB): FLEXIBLE SIGMOIDOSCOPY (N/A)  Patient Location: PACU  Anesthesia Type: MAC  Level of Consciousness: awake and patient cooperative  Airway & Oxygen Therapy: Patient Spontanous Breathing and Patient connected to face mask oxygen  Post-op Assessment: Report given to PACU RN, Post -op Vital signs reviewed and stable and Patient moving all extremities  Post vital signs: Reviewed and stable  Complications: No apparent anesthesia complications

## 2011-09-13 ENCOUNTER — Other Ambulatory Visit: Payer: Self-pay | Admitting: Gastroenterology

## 2011-09-13 ENCOUNTER — Ambulatory Visit (HOSPITAL_COMMUNITY)
Admission: RE | Admit: 2011-09-13 | Discharge: 2011-09-13 | Disposition: A | Discharge status: Home / Self Care | Payer: Medicare Other | Source: Ambulatory Visit | Attending: Family Medicine | Admitting: Family Medicine

## 2011-09-13 ENCOUNTER — Encounter (HOSPITAL_COMMUNITY): Payer: Self-pay | Admitting: Gastroenterology

## 2011-09-13 DIAGNOSIS — M545 Low back pain, unspecified: Secondary | ICD-10-CM | POA: Insufficient documentation

## 2011-09-13 DIAGNOSIS — I10 Essential (primary) hypertension: Secondary | ICD-10-CM | POA: Insufficient documentation

## 2011-09-13 DIAGNOSIS — M546 Pain in thoracic spine: Secondary | ICD-10-CM | POA: Insufficient documentation

## 2011-09-13 DIAGNOSIS — IMO0001 Reserved for inherently not codable concepts without codable children: Secondary | ICD-10-CM | POA: Insufficient documentation

## 2011-09-13 DIAGNOSIS — M6281 Muscle weakness (generalized): Secondary | ICD-10-CM | POA: Insufficient documentation

## 2011-09-13 DIAGNOSIS — E119 Type 2 diabetes mellitus without complications: Secondary | ICD-10-CM | POA: Insufficient documentation

## 2011-09-13 DIAGNOSIS — E785 Hyperlipidemia, unspecified: Secondary | ICD-10-CM | POA: Insufficient documentation

## 2011-09-13 NOTE — Progress Notes (Signed)
Physical Therapy Treatment Patient Details  Name: Cheryl Abbott MRN: [id number] Date of Birth: 04/02/1946  Today's Date: 09/13/2011 Time: [id number] Time Calculation (min): 50 min Visit#: 7  of 12   Re-eval: 09/19/11    Subjective: Symptoms/Limitations Symptoms: Pt states she has not been doing her exercises at home. Pain Assessment Currently in Pain?: Yes Pain Score:   3 Pain Location: Back Pain Orientation: Lower Pain Type: Chronic pain  Exercise/Treatments   Stability Hip Abduction: 10 reps;Weights;Limitations Heel Squeeze: 10 reps Leg Raise: Right;Left;10 reps Machine Exercises Cybex Lumbar Extension:  (3 Pl x 10 ) Cybex Knee Flexion:  (4 Pl x 10) Cybex Leg Press:  (Row with 1 pl x 10; leg press 4 pl x 10) Tread Mill: 5' @.8 mph    Physical Therapy Assessment and Plan PT Assessment and Plan Clinical Impression Statement: Pt slowly improving.  Pt tends to state "I can't" for any new activities... Pt added machines and prone exercises to improve strength.     Problem List Patient Active Problem List  Diagnoses  . 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 DIABETES MELLITUS, TYPE II  . Hyperlipidemia LDL goal < 70  . OBESITY, UNSPECIFIED  . ANEMIA  . ANXIETY DISORDER  . Hypertension goal BP (blood pressure) < 130/80  . HEMORRHOIDS  . GERD  . DYSPEPSIA  . FATTY LIVER DISEASE  . PYELONEPHRITIS  . RENAL CALCULUS  . ACUTE CYSTITIS  . BACK PAIN  . FOOT PAIN  . ORTHOSTATIC DIZZINESS  . SLEEP APNEA  . FATIGUE  . CHEST PAIN UNSPECIFIED  . NAUSEA AND VOMITING  . COLONIC POLYPS, ADENOMATOUS, HX OF  . UNSPECIFIED DERMATOMYCOSIS  . CERVICAL LYMPHADENITIS  . LIPOMA  . LYMPHADENOPATHY  . Hypercalcemia  . Nausea  . Muscle weakness (generalized)  . Thoracic or lumbosacral neuritis or radiculitis, unspecified  . Conjunctivitis  . Costochondritis, acute    PT - End of Session Activity Tolerance: Patient tolerated treatment well General Behavior  During Session: [facility] for tasks performed  GP No functional reporting required  Adler Alton,CINDY 09/13/2011, 1:48 PM

## 2011-09-14 ENCOUNTER — Ambulatory Visit (HOSPITAL_COMMUNITY)
Admission: RE | Admit: 2011-09-14 | Discharge: 2011-09-14 | Disposition: A | Discharge status: Home / Self Care | Payer: Medicare Other | Source: Ambulatory Visit | Attending: Family Medicine | Admitting: Family Medicine

## 2011-09-14 NOTE — Op Note (Signed)
[facility] [address] [place], [place]  [address]  FLEXIBLE SIGMOIDOSCOPY PROCEDURE REPORT  PATIENT:  Cheryl, Abbott  MR#:  [id number] BIRTHDATE:  [birth year], 66 yrs. old  GENDER:  female  ENDOSCOPIST:  Barney Drain, MD Referred by:  Tula Nakayama, M.D.  PROCEDURE DATE:  09/11/2011 PROCEDURE:  Flexible Sigmoidoscopy, diagnostic ASA CLASS: INDICATIONS:  personal hx: polyps ATE A BIG BREAKFAST YESTERDAY  MEDICATIONS:   MAC sedation, administered by CRNA  DESCRIPTION OF PROCEDURE:   After the risks benefits and alternatives of the procedure were thoroughly explained, informed consent was obtained.  Digital rectal exam was performed and revealed no abnormalities.   The  endoscope was introduced through the anus and advanced to the sigmoid colon, limited by poor preparation.   The quality of the prep was poor.  The instrument was then slowly withdrawn as the mucosa was fully examined. <<PROCEDUREIMAGES>>  Internal hemorrhoids were found.   Retroflexion was not performed. The scope was then withdrawn from the patient and the procedure terminated. LIMITED EXAM OF THE SIGOID COLON DUE TO POOR BOWEL PREP.  COMPLICATIONS:  None  ENDOSCOPIC IMPRESSION: 1) MODERATE Internal hemorrhoids  RECOMMENDATIONS: TCS APR 23 W/ PROPOFOL-2 DAY BOWEL PREP  REPEAT EXAM:  No  ______________________________ Barney Drain, MD  CC:  n. eSIGNED:   Landis Dowdy at 09/14/2011 04:15 PM  [name], [id number]

## 2011-09-14 NOTE — Progress Notes (Signed)
Physical Therapy Treatment Patient Details  Name: Cheryl Abbott MRN: [id number] Date of Birth: June 22, 1945  Today's Date: 09/14/2011 Time: [id number] Time Calculation (min): 67 min Visit#: 8  of 12   Re-eval: 09/19/11  Charge: gait 8 min therex 45 min MHP 10 min  Subjective: Symptoms/Limitations Symptoms: Pt reported no pain today just sore from session yesterday, pt has been c/o chest pain, has run out of perscription IBProfen, pt advised to call MD if chest pain continues. Pain Assessment Currently in Pain?: No/denies  Objective:   Exercise/Treatments Stability Heel Squeeze: Prone;10 reps;5 seconds Opposite Arm/Leg Raise: Right arm/Left leg;Left arm/Right leg;10 reps Machine Exercises Cybex Lumbar Extension: 3 Pl x 15 Cybex Knee Extension: 3 Pl 10 reps Cybex Knee Flexion: 3.5 Pl x 15 Cybex Leg Press: Row 1 Pl 10 reps; 4 Pl 15 reps Tread Mill: 8' @ 1.0 mph for endurance  Physical Therapy Assessment and Plan PT Assessment and Plan Clinical Impression Statement: Progressed prone leg raise including arm for postural strengthening, pt following cues for proper technique without difficulty.  Able to increase time and speed for endurance on treadmill and increased reps on cybex machines with noted fatigue. PT Plan: Re-eval next session.    Goals    Problem List Patient Active Problem List  Diagnoses  . 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 DIABETES MELLITUS, TYPE II  . Hyperlipidemia LDL goal < 70  . OBESITY, UNSPECIFIED  . ANEMIA  . ANXIETY DISORDER  . Hypertension goal BP (blood pressure) < 130/80  . HEMORRHOIDS  . GERD  . DYSPEPSIA  . FATTY LIVER DISEASE  . PYELONEPHRITIS  . RENAL CALCULUS  . ACUTE CYSTITIS  . BACK PAIN  . FOOT PAIN  . ORTHOSTATIC DIZZINESS  . SLEEP APNEA  . FATIGUE  . CHEST PAIN UNSPECIFIED  . NAUSEA AND VOMITING  . COLONIC POLYPS, ADENOMATOUS, HX OF  . UNSPECIFIED DERMATOMYCOSIS  . CERVICAL LYMPHADENITIS  . LIPOMA  .  LYMPHADENOPATHY  . Hypercalcemia  . Nausea  . Muscle weakness (generalized)  . Thoracic or lumbosacral neuritis or radiculitis, unspecified  . Conjunctivitis  . Costochondritis, acute    PT - End of Session Activity Tolerance: Patient tolerated treatment well General Behavior During Session: [facility] for tasks performed Cognition: [facility] for tasks performed  GP No functional reporting required  Cheryl Abbott 09/14/2011, 5:12 PM

## 2011-09-18 ENCOUNTER — Other Ambulatory Visit: Payer: Self-pay | Admitting: Family Medicine

## 2011-09-18 ENCOUNTER — Ambulatory Visit (HOSPITAL_COMMUNITY)
Admission: RE | Admit: 2011-09-18 | Discharge: 2011-09-18 | Disposition: A | Discharge status: Home / Self Care | Payer: Medicare Other | Source: Ambulatory Visit | Attending: Family Medicine | Admitting: Family Medicine

## 2011-09-18 NOTE — Evaluation (Signed)
Physical Therapy Evaluation  Patient Details  Name: Cheryl Abbott MRN: [id number] Date of Birth: October 01, 1945  Today's Date: 09/18/2011 Time: 1030-1106 Time Calculation (min): 36 min  Visit#: 9  of 12   Re-eval: 10/02/11 Assessment Diagnosis: Lumbar strain Next MD Visit: 11/06/11 Prior Therapy: 7/12 for back pain  Past Medical History:  Past Medical History  Diagnosis Date  . Anxiety disorder   . Back pain   . Diabetes mellitus type 1   . Hyperlipidemia   . Hypertension   . Sleep apnea   . Disease of vein   . Renal insufficiency   . Coronary artery disease   . Complication of anesthesia     pt states she was not given enough medication with last surgery.  She was able to feel and hear everything.   Past Surgical History:  Past Surgical History  Procedure Date  . Partial hysterectomy   . Extraction of left kidney stone   . Lithotryspy and stent replacement rt ureter 10/2004    12/2009 Dr, Michela Pitcher   . Removal of lump from posterior r thigh   . Bilateral stone removal with nephrostomy at [facility] October 2011  . Colonoscopy 08/21/06    adenomatous polyps/  . Cardiac catheterization 2005  . Flexible sigmoidoscopy 09/11/2011    Procedure: FLEXIBLE SIGMOIDOSCOPY;  Surgeon: Danie Binder, MD;  Location: [facility];  Service: Endoscopy;  Laterality: N/A;  with propofol    Subjective Symptoms/Limitations Symptoms: My pain is not very bad today. How long can you sit comfortably?: Cheryl Abbott states that she is able to sit for three to four hours in her recliner chair.  She is not having as much pain down her leg but she is still experiencing pain. How long can you stand comfortably?: The patient is able to stand for 45 minutes now; prior level was able to stand for 15 minutes. How long can you walk comfortably?: The patient is able to walk for ten minutes now.  Pt states she is limited more from her breathing and her legs being tired.  She was unable to walk for five  minutes. Pain Assessment Currently in Pain?: Yes Pain Score:   2 (worst pain has been a four.) Pain Location: Back Pain Orientation: Lower Pain Type: Chronic pain Pain Onset: More than a month ago Pain Frequency: Constant  Sensation/Coordination/Flexibility/Functional Tests Functional Tests Functional Tests: 23/50  Assessment RLE Strength Right Hip Flexion: 3+/5 (was 4/5 but in July was 3+) Right Hip ABduction: 3+/5 (was 3+) Right Knee Flexion: 5/5 (was 4/5) Right Knee Extension: 5/5 Right Ankle Dorsiflexion: 3+/5 (cog-wheel ) LLE Strength Left Hip Flexion: 3+/5 (was 4/5 at eval but 3+/5 in July) Left Hip ABduction: 3+/5 (was 3+) Left Hip ADduction: 3+/5 (was 4/5) Left Knee Flexion: 5/5 Left Knee Extension: 5/5 (was 3+) Left Ankle Dorsiflexion: 3+/5 (cogwheel) Lumbar AROM Lumbar Flexion: wnl was decreased 30% Lumbar Extension: decreased 25% was decreased 50% Lumbar - Right Side Bend: decreased 20% was decreased 60% Lumbar - Left Side Bend: decreased 20% was decreased 30% Lumbar - Right Rotation: wnl was decreased 50% Lumbar - Left Rotation: wnl was decreased 30%  Exercise/Treatments  Treadmill x 8 min followed by prone heel squeeze x 15   Physical Therapy Assessment and Plan PT Assessment and Plan Clinical Impression Statement: Pt has improved in ROM but has not improved in strength.  Pt admits to not doing HEP will concentrate on strengthening and encouraging pt to complete HEP continue to increase speed  of TM begin functional activity ie picking items off floor; steps... Rehab Potential: Fair Clinical Impairments Affecting Rehab Potential: pain weakness PT Frequency: Min 2X/week PT Duration:  (2 more weeks then discharge to HEP) PT Plan: See two more weeks concentrating on functional strengthening tasks.    Goals Home Exercise Program PT Goal: Perform Home Exercise Program - Progress:  (Pt admits that most of the time she is not doing her exercis) PT Short Term  Goals PT Short Term Goal 1 - Progress: Met PT Short Term Goal 2 - Progress: Met PT Long Term Goals PT Long Term Goal 1 - Progress: Partly met PT Long Term Goal 2 - Progress: Not met Long Term Goal 3 Progress: Partly met  Problem List Patient Active Problem List  Diagnoses  . 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 DIABETES MELLITUS, TYPE II  . Hyperlipidemia LDL goal < 70  . OBESITY, UNSPECIFIED  . ANEMIA  . ANXIETY DISORDER  . Hypertension goal BP (blood pressure) < 130/80  . HEMORRHOIDS  . GERD  . DYSPEPSIA  . FATTY LIVER DISEASE  . PYELONEPHRITIS  . RENAL CALCULUS  . ACUTE CYSTITIS  . BACK PAIN  . FOOT PAIN  . ORTHOSTATIC DIZZINESS  . SLEEP APNEA  . FATIGUE  . CHEST PAIN UNSPECIFIED  . NAUSEA AND VOMITING  . COLONIC POLYPS, ADENOMATOUS, HX OF  . UNSPECIFIED DERMATOMYCOSIS  . CERVICAL LYMPHADENITIS  . LIPOMA  . LYMPHADENOPATHY  . Hypercalcemia  . Nausea  . Muscle weakness (generalized)  . Thoracic or lumbosacral neuritis or radiculitis, unspecified  . Conjunctivitis  . Costochondritis, acute    General Behavior During Session: [facility] for tasks performed Cognition: [facility] for tasks performed    Cheryl Abbott,CINDY 09/18/2011, 2:36 PM  Physician Documentation Your signature is required to indicate approval of the treatment plan as stated above.  Please sign and either send electronically or make a copy of this report for your files and return this physician signed original.   Please mark one 1.__approve of plan  2. ___approve of plan with the following conditions.   ______________________________                                                          _____________________ Physician Signature                                                                                                             Date

## 2011-09-20 ENCOUNTER — Encounter (HOSPITAL_COMMUNITY): Payer: Self-pay | Admitting: Pharmacy Technician

## 2011-09-20 ENCOUNTER — Telehealth: Payer: Self-pay | Admitting: Gastroenterology

## 2011-09-20 NOTE — Telephone Encounter (Signed)
Pt called wanting to talk to Dr Oneida Alar about her previous TCS and her upcoming repeat TCS- I told her Dr Oneida Alar was out of the office until 04/15 -  She would like Dr Oneida Alar to call her at [phone]

## 2011-09-21 ENCOUNTER — Encounter (HOSPITAL_COMMUNITY): Payer: Self-pay

## 2011-09-21 ENCOUNTER — Encounter (HOSPITAL_COMMUNITY)
Admission: RE | Admit: 2011-09-21 | Discharge: 2011-09-21 | Disposition: A | Discharge status: Home / Self Care | Payer: Medicare Other | Source: Ambulatory Visit | Attending: Gastroenterology | Admitting: Gastroenterology

## 2011-09-21 HISTORY — DX: Depression, unspecified: F32.A

## 2011-09-21 HISTORY — DX: Major depressive disorder, single episode, unspecified: F32.9

## 2011-09-21 LAB — BASIC METABOLIC PANEL
BUN: 21 mg/dL (ref 6–23)
CO2: 28 mEq/L (ref 19–32)
Calcium: 10.9 mg/dL — ABNORMAL HIGH (ref 8.4–10.5)
Chloride: 97 mEq/L (ref 96–112)
Creatinine, Ser: 1.06 mg/dL (ref 0.50–1.10)
GFR calc Af Amer: 62 mL/min — ABNORMAL LOW (ref >90)
GFR calc non Af Amer: 53 mL/min — ABNORMAL LOW (ref >90)
Glucose, Bld: 74 mg/dL (ref 70–99)
Potassium: 4.7 mEq/L (ref 3.5–5.1)
Sodium: 135 mEq/L (ref 135–145)

## 2011-09-21 LAB — HEMOGLOBIN AND HEMATOCRIT, BLOOD
HCT: 31.9 % — ABNORMAL LOW (ref 36.0–46.0)
Hemoglobin: 9.4 g/dL — ABNORMAL LOW (ref 12.0–15.0)

## 2011-09-21 NOTE — Patient Instructions (Addendum)
[facility]  09/21/2011   Your procedure is scheduled on:   10/02/2011  Report to [facility] at  900  AM.  Call this number if you have problems the morning of surgery: [phone]   Remember:   Do not eat food:After Midnight.  May have clear liquids:until Midnight .  Clear liquids include soda, tea, black coffee, apple or grape juice, broth.  Take these medicines the morning of surgery with A SIP OF WATER:  Xanax,ultram,norvasc,prilosec,paxil,phenergan   Do not wear jewelry, make-up or nail polish.  Do not wear lotions, powders, or perfumes. You may wear deodorant.  Do not shave 48 hours prior to surgery.  Do not bring valuables to the hospital.  Contacts, dentures or bridgework may not be worn into surgery.  Leave suitcase in the car. After surgery it may be brought to your room.  For patients admitted to the hospital, checkout time is 11:00 AM the day of discharge.   Patients discharged the day of surgery will not be allowed to drive home.  Name and phone number of your driver: family  Special Instructions: N/A   Please read over the following fact sheets that you were given: Pain Booklet, Surgical Site Infection Prevention, Anesthesia Post-op Instructions and Care and Recovery After Surgery Colonoscopy A colonoscopy is an exam to evaluate your entire colon. In this exam, your colon is cleansed. A long fiberoptic tube is inserted through your rectum and into your colon. The fiberoptic scope (endoscope) is a long bundle of enclosed and very flexible fibers. These fibers transmit light to the area examined and send images from that area to your caregiver. Discomfort is usually minimal. You may be given a drug to help you sleep (sedative) during or prior to the procedure. This exam helps to detect lumps (tumors), polyps, inflammation, and areas of bleeding. Your caregiver may also take a small piece of tissue (biopsy) that will be examined under a microscope. LET YOUR CAREGIVER KNOW  ABOUT:   Allergies to food or medicine.   Medicines taken, including vitamins, herbs, eyedrops, over-the-counter medicines, and creams.   Use of steroids (by mouth or creams).   Previous problems with anesthetics or numbing medicines.   History of bleeding problems or blood clots.   Previous surgery.   Other health problems, including diabetes and kidney problems.   Possibility of pregnancy, if this applies.  BEFORE THE PROCEDURE   A clear liquid diet may be required for 2 days before the exam.   Ask your caregiver about changing or stopping your regular medications.   Liquid injections (enemas) or laxatives may be required.   A large amount of electrolyte solution may be given to you to drink over a short period of time. This solution is used to clean out your colon.   You should be present 60 minutes prior to your procedure or as directed by your caregiver.  AFTER THE PROCEDURE   If you received a sedative or pain relieving medication, you will need to arrange for someone to drive you home.   Occasionally, there is a little blood passed with the first bowel movement. Do not be concerned.  FINDING OUT THE RESULTS OF YOUR TEST Not all test results are available during your visit. If your test results are not back during the visit, make an appointment with your caregiver to find out the results. Do not assume everything is normal if you have not heard from your caregiver or the medical facility. It is  important for you to follow up on all of your test results. HOME CARE INSTRUCTIONS   It is not unusual to pass moderate amounts of gas and experience mild abdominal cramping following the procedure. This is due to air being used to inflate your colon during the exam. Walking or a warm pack on your belly (abdomen) may help.   You may resume all normal meals and activities after sedatives and medicines have worn off.   Only take over-the-counter or prescription medicines for pain,  discomfort, or fever as directed by your caregiver. Do not use aspirin or blood thinners if a biopsy was taken. Consult your caregiver for medicine usage if biopsies were taken.  SEEK IMMEDIATE MEDICAL CARE IF:   You have a fever.   You pass large blood clots or fill a toilet with blood following the procedure. This may also occur 10 to 14 days following the procedure. This is more likely if a biopsy was taken.   You develop abdominal pain that keeps getting worse and cannot be relieved with medicine.  Document Released: 05/25/2000 Document Revised: 05/17/2011 Document Reviewed: 01/08/2008 [facility] Patient Information 2012 [place].PATIENT INSTRUCTIONS POST-ANESTHESIA  IMMEDIATELY FOLLOWING SURGERY:  Do not drive or operate machinery for the first twenty four hours after surgery.  Do not make any important decisions for twenty four hours after surgery or while taking narcotic pain medications or sedatives.  If you develop intractable nausea and vomiting or a severe headache please notify your doctor immediately.  FOLLOW-UP:  Please make an appointment with your surgeon as instructed. You do not need to follow up with anesthesia unless specifically instructed to do so.  WOUND CARE INSTRUCTIONS (if applicable):  Keep a dry clean dressing on the anesthesia/puncture wound site if there is drainage.  Once the wound has quit draining you may leave it open to air.  Generally you should leave the bandage intact for twenty four hours unless there is drainage.  If the epidural site drains for more than 36-48 hours please call the anesthesia department.  QUESTIONS?:  Please feel free to call your physician or the hospital operator if you have any questions, and they will be happy to assist you.     [place] [phone]

## 2011-09-25 NOTE — Telephone Encounter (Signed)
ANSWERED QUESTIONS. PT HAS MICROCYTIC ANEMIA. NEVER HAD EGD. NEEDS TCS/?EGD ON APR 23 FOR ANEMIA/PERSONLA HX: POLYPS.

## 2011-09-28 ENCOUNTER — Ambulatory Visit (INDEPENDENT_AMBULATORY_CARE_PROVIDER_SITE_OTHER): Payer: Medicare Other | Admitting: Family Medicine

## 2011-09-28 ENCOUNTER — Ambulatory Visit (HOSPITAL_COMMUNITY)
Admission: RE | Admit: 2011-09-28 | Discharge: 2011-09-28 | Disposition: A | Discharge status: Home / Self Care | Payer: Medicare Other | Source: Ambulatory Visit | Attending: Family Medicine | Admitting: Family Medicine

## 2011-09-28 VITALS — BP 110/72 | HR 95 | Resp 16 | Ht 67.0 in | Wt 201.0 lb

## 2011-09-28 DIAGNOSIS — M25529 Pain in unspecified elbow: Secondary | ICD-10-CM | POA: Insufficient documentation

## 2011-09-28 DIAGNOSIS — M25522 Pain in left elbow: Secondary | ICD-10-CM | POA: Insufficient documentation

## 2011-09-28 DIAGNOSIS — R5381 Other malaise: Secondary | ICD-10-CM

## 2011-09-28 DIAGNOSIS — E669 Obesity, unspecified: Secondary | ICD-10-CM

## 2011-09-28 DIAGNOSIS — E119 Type 2 diabetes mellitus without complications: Secondary | ICD-10-CM

## 2011-09-28 DIAGNOSIS — D649 Anemia, unspecified: Secondary | ICD-10-CM

## 2011-09-28 DIAGNOSIS — I10 Essential (primary) hypertension: Secondary | ICD-10-CM

## 2011-09-28 DIAGNOSIS — E785 Hyperlipidemia, unspecified: Secondary | ICD-10-CM

## 2011-09-28 MED ORDER — IBUPROFEN 800 MG PO TABS: 800.0000 mg | ORAL_TABLET | Freq: Three times a day (TID) | ORAL | Status: DC | PRN

## 2011-09-28 NOTE — Patient Instructions (Signed)
F/u in 4 month   HBA1C today.  It is important that you exercise regularly at least 30 minutes 5 times a week. If you develop chest pain, have severe difficulty breathing, or feel very tired, stop exercising immediately and seek medical attention    A healthy diet is rich in fruit, vegetables and whole grains. Poultry fish, nuts and beans are a healthy choice for protein rather then red meat. A low sodium diet and drinking 64 ounces of water daily is generally recommended. Oils and sweet should be limited. Carbohydrates especially for those who are diabetic or overweight, should be limited to 30-45 gram per meal. It is important to eat on a regular schedule, at least 3 times daily. Snacks should be primarily fruits, vegetables or nuts.   Fasting lipid cmp and EGFR, hBA1c in 4 month
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
## 2011-09-29 ENCOUNTER — Encounter: Payer: Self-pay | Admitting: Family Medicine

## 2011-09-29 LAB — HEMOGLOBIN A1C
Hgb A1c MFr Bld: 7 % — ABNORMAL HIGH (ref <5.7)
Mean Plasma Glucose: 154 mg/dL — ABNORMAL HIGH (ref <117)
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
## 2011-09-29 NOTE — Assessment & Plan Note (Signed)
Hyperlipidemia:Low fat diet discussed and encouraged.  Updated labs in the next 3 months
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
## 2011-09-29 NOTE — Assessment & Plan Note (Signed)
Deteriorated. Patient re-educated about  the importance of commitment to a  minimum of 150 minutes of exercise per week. The importance of healthy food choices with portion control discussed. Encouraged to start a food diary, count calories and to consider  joining a support group. Sample diet sheets offered. Goals set by the patient for the next several months.    

## 2011-09-29 NOTE — Assessment & Plan Note (Signed)
Controlled, no change in medication  

## 2011-09-29 NOTE — Assessment & Plan Note (Signed)
Deteriorated as far as control is concerned, though still adequate. Pt will be encouraged to be more diligent with diet and regular physical activity, no change in medication at this time

## 2011-09-30 ENCOUNTER — Other Ambulatory Visit: Payer: Self-pay | Admitting: Family Medicine

## 2011-10-01 ENCOUNTER — Telehealth: Payer: Self-pay | Admitting: Family Medicine

## 2011-10-01 NOTE — Telephone Encounter (Signed)
Normal and a letter was sent out to her

## 2011-10-01 NOTE — Telephone Encounter (Signed)
Pt aware of x ray results and states that she is still having problems with elbow.  She also stated that she is willing to go back to therapy.

## 2011-10-02 ENCOUNTER — Ambulatory Visit (HOSPITAL_COMMUNITY): Payer: Medicare Other | Admitting: Anesthesiology

## 2011-10-02 ENCOUNTER — Ambulatory Visit (HOSPITAL_COMMUNITY)
Admission: RE | Admit: 2011-10-02 | Discharge: 2011-10-02 | Disposition: A | Discharge status: Home / Self Care | Payer: Medicare Other | Source: Ambulatory Visit | Attending: Gastroenterology | Admitting: Gastroenterology

## 2011-10-02 ENCOUNTER — Encounter (HOSPITAL_COMMUNITY): Payer: Self-pay | Admitting: *Deleted

## 2011-10-02 ENCOUNTER — Encounter (HOSPITAL_COMMUNITY)
Admission: RE | Disposition: A | Discharge status: Home / Self Care | Payer: Self-pay | Source: Ambulatory Visit | Attending: Gastroenterology

## 2011-10-02 ENCOUNTER — Encounter (HOSPITAL_COMMUNITY): Payer: Self-pay | Admitting: Anesthesiology

## 2011-10-02 DIAGNOSIS — Z860100 Personal history of colon polyps, unspecified: Secondary | ICD-10-CM | POA: Insufficient documentation

## 2011-10-02 DIAGNOSIS — K5730 Diverticulosis of large intestine without perforation or abscess without bleeding: Secondary | ICD-10-CM | POA: Insufficient documentation

## 2011-10-02 DIAGNOSIS — E119 Type 2 diabetes mellitus without complications: Secondary | ICD-10-CM | POA: Insufficient documentation

## 2011-10-02 DIAGNOSIS — I10 Essential (primary) hypertension: Secondary | ICD-10-CM | POA: Insufficient documentation

## 2011-10-02 DIAGNOSIS — Z01812 Encounter for preprocedural laboratory examination: Secondary | ICD-10-CM | POA: Insufficient documentation

## 2011-10-02 DIAGNOSIS — K648 Other hemorrhoids: Secondary | ICD-10-CM | POA: Insufficient documentation

## 2011-10-02 DIAGNOSIS — E785 Hyperlipidemia, unspecified: Secondary | ICD-10-CM | POA: Insufficient documentation

## 2011-10-02 LAB — GLUCOSE, CAPILLARY
Glucose-Capillary: 102 mg/dL — ABNORMAL HIGH (ref 70–99)
Glucose-Capillary: 95 mg/dL (ref 70–99)

## 2011-10-02 SURGERY — COLONOSCOPY WITH PROPOFOL
Anesthesia: Monitor Anesthesia Care

## 2011-10-02 MED ORDER — LACTATED RINGERS IV SOLN
INTRAVENOUS | Status: DC | PRN
  Administered 2011-10-02: 10:00:00 via INTRAVENOUS

## 2011-10-02 MED ORDER — MIDAZOLAM HCL 2 MG/2ML IJ SOLN
INTRAMUSCULAR | Status: AC
  Filled 2011-10-02: qty 2

## 2011-10-02 MED ORDER — FENTANYL CITRATE 0.05 MG/ML IJ SOLN
INTRAMUSCULAR | Status: AC
  Filled 2011-10-02: qty 2

## 2011-10-02 MED ORDER — PROPOFOL 10 MG/ML IV EMUL
INTRAVENOUS | Status: DC | PRN
  Administered 2011-10-02: 75 ug/kg/min via INTRAVENOUS

## 2011-10-02 MED ORDER — WATER FOR IRRIGATION, STERILE IR SOLN
Status: DC | PRN
  Administered 2011-10-02: 1000 mL

## 2011-10-02 MED ORDER — FENTANYL CITRATE 0.05 MG/ML IJ SOLN
INTRAMUSCULAR | Status: DC | PRN
  Administered 2011-10-02: 25 ug via INTRAVENOUS
  Administered 2011-10-02: 50 ug via INTRAVENOUS
  Administered 2011-10-02: 25 ug via INTRAVENOUS

## 2011-10-02 MED ORDER — GLYCOPYRROLATE 0.2 MG/ML IJ SOLN
0.2000 mg | Freq: Once | INTRAMUSCULAR | Status: AC
  Administered 2011-10-02: 0.2 mg via INTRAVENOUS

## 2011-10-02 MED ORDER — ONDANSETRON HCL 4 MG/2ML IJ SOLN
4.0000 mg | Freq: Once | INTRAMUSCULAR | Status: AC
  Administered 2011-10-02: 4 mg via INTRAVENOUS

## 2011-10-02 MED ORDER — MIDAZOLAM HCL 5 MG/5ML IJ SOLN
INTRAMUSCULAR | Status: DC | PRN
  Administered 2011-10-02: 2 mg via INTRAVENOUS

## 2011-10-02 MED ORDER — MIDAZOLAM HCL 2 MG/2ML IJ SOLN
1.0000 mg | INTRAMUSCULAR | Status: DC | PRN
  Administered 2011-10-02: 2 mg via INTRAVENOUS

## 2011-10-02 MED ORDER — ONDANSETRON HCL 4 MG/2ML IJ SOLN: 4.0000 mg | Freq: Once | INTRAMUSCULAR | Status: DC | PRN

## 2011-10-02 MED ORDER — ETOMIDATE 2 MG/ML IV SOLN
INTRAVENOUS | Status: AC
  Filled 2011-10-02: qty 10

## 2011-10-02 MED ORDER — LACTATED RINGERS IV SOLN
INTRAVENOUS | Status: DC
  Administered 2011-10-02: 1000 mL via INTRAVENOUS

## 2011-10-02 MED ORDER — FENTANYL CITRATE 0.05 MG/ML IJ SOLN: 25.0000 ug | INTRAMUSCULAR | Status: DC | PRN

## 2011-10-02 MED ORDER — PROPOFOL 10 MG/ML IV EMUL
INTRAVENOUS | Status: AC
  Filled 2011-10-02: qty 20

## 2011-10-02 MED ORDER — GLYCOPYRROLATE 0.2 MG/ML IJ SOLN
INTRAMUSCULAR | Status: AC
  Filled 2011-10-02: qty 1

## 2011-10-02 MED ORDER — LIDOCAINE HCL (CARDIAC) 10 MG/ML IV SOLN
INTRAVENOUS | Status: DC | PRN
  Administered 2011-10-02: 50 mg via INTRAVENOUS
  Administered 2011-10-02: 10 mg via INTRAVENOUS

## 2011-10-02 MED ORDER — ONDANSETRON HCL 4 MG/2ML IJ SOLN
INTRAMUSCULAR | Status: AC
  Filled 2011-10-02: qty 2

## 2011-10-02 MED ORDER — STERILE WATER FOR IRRIGATION IR SOLN
Status: DC | PRN
  Administered 2011-10-02: 11:00:00

## 2011-10-02 SURGICAL SUPPLY — 22 items
ELECT REM PT RETURN 9FT ADLT (ELECTROSURGICAL)
ELECTRODE REM PT RTRN 9FT ADLT (ELECTROSURGICAL) IMPLANT
FCP BXJMBJMB 240X2.8X (CUTTING FORCEPS)
FLOOR PAD 36X40 (MISCELLANEOUS) ×2
FORCEPS BIOP RAD 4 LRG CAP 4 (CUTTING FORCEPS) IMPLANT
FORCEPS BIOP RJ4 240 W/NDL (CUTTING FORCEPS)
FORCEPS BXJMBJMB 240X2.8X (CUTTING FORCEPS) IMPLANT
INJECTOR/SNARE I SNARE (MISCELLANEOUS) IMPLANT
LUBRICANT JELLY 4.5OZ STERILE (MISCELLANEOUS) ×1 IMPLANT
MANIFOLD NEPTUNE II (INSTRUMENTS) ×1 IMPLANT
NDL SCLEROTHERAPY 25GX240 (NEEDLE) IMPLANT
NEEDLE SCLEROTHERAPY 25GX240 (NEEDLE) IMPLANT
PAD FLOOR 36X40 (MISCELLANEOUS) ×1 IMPLANT
PROBE APC STR FIRE (PROBE) IMPLANT
PROBE INJECTION GOLD (MISCELLANEOUS)
PROBE INJECTION GOLD 7FR (MISCELLANEOUS) IMPLANT
SNARE SHORT THROW 13M SML OVAL (MISCELLANEOUS) ×2 IMPLANT
SYR 50ML LL SCALE MARK (SYRINGE) ×1 IMPLANT
TRAP SPECIMEN MUCOUS 40CC (MISCELLANEOUS) IMPLANT
TUBING ENDO SMARTCAP PENTAX (MISCELLANEOUS) IMPLANT
TUBING IRRIGATION ENDOGATOR (MISCELLANEOUS) ×1 IMPLANT
WATER STERILE IRR 1000ML POUR (IV SOLUTION) ×2 IMPLANT

## 2011-10-02 NOTE — H&P (Signed)
Primary Care Physician:  Tula Nakayama, MD, MD Primary Gastroenterologist:  Dr. Oneida Alar  Pre-Procedure History & Physical: HPI:  Cheryl Abbott is a 66 y.o. female here for  PERSONAL HISTORY OF POLYPS.   Past Medical History  Diagnosis Date  . Anxiety disorder   . Back pain   . Diabetes mellitus type 1   . Hyperlipidemia   . Hypertension   . Sleep apnea   . Disease of vein   . Renal insufficiency   . Coronary artery disease   . Complication of anesthesia     pt states she was not given enough medication with  tcs with Dr Tamala Julian                                    last [place].as able to feel and hear everything.  . Depression   . DEMENTIA     Past Surgical History  Procedure Date  . Partial hysterectomy   . Lithotryspy and stent replacement rt ureter 10/2004    12/2009 Dr, Michela Pitcher   . Removal of lump from posterior r thigh   . Colonoscopy 08/21/06    adenomatous polyps/  . Cardiac catheterization 2005  . Flexible sigmoidoscopy 09/11/2011    Procedure: FLEXIBLE SIGMOIDOSCOPY;  Surgeon: Danie Binder, MD;  Location: [facility];  Service: Endoscopy;  Laterality: N/A;  with propofol  . Extraction of left kidney stone   . Bilateral stone removal with nephrostomy at [facility]     Prior to Admission medications   Medication Sig Start Date End Date Taking? Authorizing Provider  ALPRAZolam Duanne Moron) 0.5 MG tablet Take 1 tablet (0.5 mg total) by mouth 2 (two) times daily. 09/06/11  Yes [name], MD  amLODipine (NORVASC) 5 MG tablet Take 5 mg by mouth daily.   Yes Historical Provider, MD  aspirin EC 81 MG tablet Take 81 mg by mouth daily.    Yes Historical Provider, MD  diphenhydramine-acetaminophen (TYLENOL PM) 25-500 MG TABS Take 2 tablets by mouth at bedtime as needed. Sleep/Pain   Yes Historical Provider, MD  ibuprofen (ADVIL,MOTRIN) 800 MG tablet Take 1 tablet (800 mg total) by mouth every 8 (eight) hours as needed. Pain 09/28/11  Yes [name], MD  indapamide  (LOZOL) 1.25 MG tablet Take 1.25 mg by mouth daily.   Yes Historical Provider, MD  lovastatin (MEVACOR) 40 MG tablet Take 1 tablet (40 mg total) by mouth daily. 06/14/11  Yes [name], MD  metFORMIN (GLUCOPHAGE) 500 MG tablet Take 2 tablets (1,000 mg total) by mouth 2 (two) times daily with a meal. 07/17/11  Yes [name], MD  omeprazole (PRILOSEC) 20 MG capsule Take 20 mg by mouth daily.  06/14/11  Yes [name], MD  PARoxetine (PAXIL) 40 MG tablet Take 40 mg by mouth daily.   Yes Historical Provider, MD  PARoxetine (PAXIL) 40 MG tablet TAKE 1 TABLET BY MOUTH EVERY MORNING 09/30/11  Yes [name], MD  potassium citrate (UROCIT-K) 10 MEQ (1080 MG) SR tablet Take 20 mEq by mouth 2 (two) times daily.    Yes Historical Provider, MD  traMADol (ULTRAM) 50 MG tablet Take 1 tablet (50 mg total) by mouth 2 (two) times daily. 06/14/11  Yes [name], MD  promethazine (PHENERGAN) 12.5 MG tablet Take 12.5 mg by mouth every 6 (six) hours as needed. Nausea and Vomiting.    Historical  Provider, MD    Allergies as of 09/13/2011 - Review Complete 09/11/2011  Allergen Reaction Noted  . Ace inhibitors Cough 12/11/2010  . Keflex (cephalexin) Diarrhea and Nausea And Vomiting 07/24/2011  . Nitrofurantoin Diarrhea and Nausea And Vomiting 09/08/2009  . Penicillins Hives, Rash, and Other (See Comments) 09/17/2007    Family History  Problem Relation Age of Onset  . Hypertension Mother   . Diabetes Mother   . Heart failure Mother   . Dementia Mother   . Emphysema Father   . Hypertension Father   . Diabetes Brother   . GER disease Brother   . Cancer      family history   . Diabetes      family history   . Heart defect      famiily history   . Arthritis      family history   . Anesthesia problems Neg Hx   . Hypotension Neg Hx   . Malignant hyperthermia Neg Hx   . Pseudochol deficiency Neg Hx     History   Social History  . Marital Status: Divorced    Spouse  Name: N/A    Number of Children: 2  . Years of Education: N/A   Occupational History  . retired     Social History Main Topics  . Smoking status: Former Smoker    Quit date: 09/21/1994  . Smokeless tobacco: Former Systems developer    Quit date: 09/04/1996  . Alcohol Use: No  . Drug Use: No  . Sexually Active: Not on file   Other Topics Concern  . Not on file   Social History Narrative  . No narrative on file    Review of Systems: See HPI, otherwise negative ROS   Physical Exam: BP 102/57  Pulse 97  Temp(Src) 98.2 F (36.8 C) (Oral)  Resp 24  SpO2 99% General:   Alert,  pleasant and cooperative in NAD Head:  Normocephalic and atraumatic. Neck:  Supple;  Lungs:  Clear throughout to auscultation.    Heart:  Regular rate and rhythm. Abdomen:  Soft, nontender and nondistended. Normal bowel sounds, without guarding, and without rebound.   Neurologic:  Alert and  oriented x4;  grossly normal neurologically.  Impression/Plan:    PERSONAL HISTORY OF POLYPS.  PLAN:  1. TCS TODAY

## 2011-10-02 NOTE — Discharge Instructions (Signed)
You have small internal hemorrhoids and diverticulosis.  FOLLOW A HIGH FIBER DIET. AVOID ITEMS THAT CAUSE BLOATING. SEE INFO BELOW. Next colonoscopy in 5 years WITH A 2 DAY BOWEL PREP. FOLLOW UP MARCH 2014.  Colonoscopy Care After Read the instructions outlined below and refer to this sheet in the next week. These discharge instructions provide you with general information on caring for yourself after you leave the hospital. While your treatment has been planned according to the most current medical practices available, unavoidable complications occasionally occur. If you have any problems or questions after discharge, call DR. Adriel Kessen, [phone].  ACTIVITY  You may resume your regular activity, but move at a slower pace for the next 24 hours.   Take frequent rest periods for the next 24 hours.   Walking will help get rid of the air and reduce the bloated feeling in your belly (abdomen).   No driving for 24 hours (because of the medicine (anesthesia) used during the test).   You may shower.   Do not sign any important legal documents or operate any machinery for 24 hours (because of the anesthesia used during the test).    NUTRITION  Drink plenty of fluids.   You may resume your normal diet as instructed by your doctor.   Begin with a light meal and progress to your normal diet. Heavy or fried foods are harder to digest and may make you feel sick to your stomach (nauseated).   Avoid alcoholic beverages for 24 hours or as instructed.    MEDICATIONS  You may resume your normal medications.   WHAT YOU CAN EXPECT TODAY  Some feelings of bloating in the abdomen.   Passage of more gas than usual.   Spotting of blood in your stool or on the toilet paper  .  IF YOU HAD POLYPS REMOVED DURING THE COLONOSCOPY:  Eat a soft diet IF YOU HAVE NAUSEA, BLOATING, ABDOMINAL PAIN, OR VOMITING.    FINDING OUT THE RESULTS OF YOUR TEST Not all test results are available during your  visit. DR. Oneida Alar WILL CALL YOU WITHIN 7 DAYS OF YOUR PROCEDUE WITH YOUR RESULTS. Do not assume everything is normal if you have not heard from DR. Tion Tse IN ONE WEEK, CALL HER OFFICE AT [phone].  SEEK IMMEDIATE MEDICAL ATTENTION AND CALL THE OFFICE: [phone] IF:  You have more than a spotting of blood in your stool.   Your belly is swollen (abdominal distention).   You are nauseated or vomiting.   You have a temperature over 101F.   You have abdominal pain or discomfort that is severe or gets worse throughout the day.  High-Fiber Diet A high-fiber diet changes your normal diet to include more whole grains, legumes, fruits, and vegetables. Changes in the diet involve replacing refined carbohydrates with unrefined foods. The calorie level of the diet is essentially unchanged. The Dietary Reference Intake (recommended amount) for adult males is 38 grams per day. For adult females, it is 25 grams per day. Pregnant and lactating women should consume 28 grams of fiber per day. Fiber is the intact part of a plant that is not broken down during digestion. Functional fiber is fiber that has been isolated from the plant to provide a beneficial effect in the body. PURPOSE  Increase stool bulk.   Ease and regulate bowel movements.   Lower cholesterol.  INDICATIONS THAT YOU NEED MORE FIBER  Constipation and hemorrhoids.   Uncomplicated diverticulosis (intestine condition) and irritable bowel syndrome.   Weight  management.   As a protective measure against hardening of the arteries (atherosclerosis), diabetes, and cancer.   GUIDELINES FOR INCREASING FIBER IN THE DIET  Start adding fiber to the diet slowly. A gradual increase of about 5 more grams (2 slices of whole-wheat bread, 2 servings of most fruits or vegetables, or 1 bowl of high-fiber cereal) per day is best. Too rapid an increase in fiber may result in constipation, flatulence, and bloating.   Drink enough water and fluids  to keep your urine clear or pale yellow. Water, juice, or caffeine-free drinks are recommended. Not drinking enough fluid may cause constipation.   Eat a variety of high-fiber foods rather than one type of fiber.   Try to increase your intake of fiber through using high-fiber foods rather than fiber pills or supplements that contain small amounts of fiber.   The goal is to change the types of food eaten. Do not supplement your present diet with high-fiber foods, but replace foods in your present diet.  INCLUDE A VARIETY OF FIBER SOURCES  Replace refined and processed grains with whole grains, canned fruits with fresh fruits, and incorporate other fiber sources. White rice, white breads, and most bakery goods contain little or no fiber.   Brown whole-grain rice, buckwheat oats, and many fruits and vegetables are all good sources of fiber. These include: broccoli, Brussels sprouts, cabbage, cauliflower, beets, sweet potatoes, white potatoes (skin on), carrots, tomatoes, eggplant, squash, berries, fresh fruits, and dried fruits.   Cereals appear to be the richest source of fiber. Cereal fiber is found in whole grains and bran. Bran is the fiber-rich outer coat of cereal grain, which is largely removed in refining. In whole-grain cereals, the bran remains. In breakfast cereals, the largest amount of fiber is found in those with "bran" in their names. The fiber content is sometimes indicated on the label.   You may need to include additional fruits and vegetables each day.   In baking, for 1 cup white flour, you may use the following substitutions:   1 cup whole-wheat flour minus 2 tablespoons.   1/2 cup white flour plus 1/2 cup whole-wheat flour.   Diverticulosis Diverticulosis is a common condition that develops when small pouches (diverticula) form in the wall of the colon. The risk of diverticulosis increases with age. It happens more often in people who eat a low-fiber diet. Most individuals  with diverticulosis have no symptoms. Those individuals with symptoms usually experience belly (abdominal) pain, constipation, or loose stools (diarrhea).  HOME CARE INSTRUCTIONS  Increase the amount of fiber in your diet as directed by your caregiver or dietician. This may reduce symptoms of diverticulosis.   Drink at least 6 to 8 glasses of water each day to prevent constipation.   Try not to strain when you have a bowel movement.   Avoiding nuts and seeds to prevent complications is still an uncertain benefit.       FOODS HAVING HIGH FIBER CONTENT INCLUDE:  Fruits. Apple, peach, pear, tangerine, raisins, prunes.   Vegetables. Brussels sprouts, asparagus, broccoli, cabbage, carrot, cauliflower, romaine lettuce, spinach, summer squash, tomato, winter squash, zucchini.   Starchy Vegetables. Baked beans, kidney beans, lima beans, split peas, lentils, potatoes (with skin).   Grains. Whole wheat bread, brown rice, bran flake cereal, plain oatmeal, white rice, shredded wheat, bran muffins.    SEEK IMMEDIATE MEDICAL CARE IF:  You develop increasing pain or severe bloating.   You have an oral temperature above 101F.   You  develop vomiting or bowel movements that are bloody or black.   Hemorrhoids Hemorrhoids are dilated (enlarged) veins around the rectum. Sometimes clots will form in the veins. This makes them swollen and painful. These are called thrombosed hemorrhoids. Causes of hemorrhoids include:  Constipation.   Straining to have a bowel movement.   HEAVY LIFTING HOME CARE INSTRUCTIONS  Eat a well balanced diet and drink 6 to 8 glasses of water every day to avoid constipation. You may also use a bulk laxative.   Avoid straining to have bowel movements.   Keep anal area dry and clean.   Do not use a donut shaped pillow or sit on the toilet for long periods. This increases blood pooling and pain.   Move your bowels when your body has the urge; this will require less  straining and will decrease pain and pressure.

## 2011-10-02 NOTE — Anesthesia Preprocedure Evaluation (Signed)
Anesthesia Evaluation  Patient identified by MRN, date of birth, ID band Patient awake    Reviewed: Allergy & Precautions, H&P , NPO status , Patient's Chart, lab work & pertinent test results  Airway Mallampati: II  Neck ROM: Full    Dental  (+) Teeth Intact   Pulmonary sleep apnea , former smoker breath sounds clear to auscultation        Cardiovascular hypertension, Pt. on medications + CAD Rhythm:Regular     Neuro/Psych PSYCHIATRIC DISORDERS Anxiety    GI/Hepatic GERD-  Medicated and Controlled,  Endo/Other  Diabetes mellitus-, Well Controlled, Type 2, Oral Hypoglycemic Agents  Renal/GU      Musculoskeletal   Abdominal   Peds  Hematology   Anesthesia Other Findings   Reproductive/Obstetrics                           Anesthesia Physical Anesthesia Plan  ASA: III  Anesthesia Plan: MAC   Post-op Pain Management:    Induction: Intravenous  Airway Management Planned: Simple Face Mask  Additional Equipment:   Intra-op Plan:   Post-operative Plan:   Informed Consent: I have reviewed the patients History and Physical, chart, labs and discussed the procedure including the risks, benefits and alternatives for the proposed anesthesia with the patient or authorized representative who has indicated his/her understanding and acceptance.     Plan Discussed with:   Anesthesia Plan Comments:         Anesthesia Quick Evaluation

## 2011-10-02 NOTE — Op Note (Signed)
[facility] [address] [place], [place]  [address]  COLONOSCOPY PROCEDURE REPORT  PATIENT:  Cheryl, Abbott  MR#:  [id number] BIRTHDATE:  10-May-1946, 50 yrs. old  GENDER:  female  ENDOSCOPIST:  Cheryl Drain, MD REF. BY:  Cheryl Abbott, M.D. ASSISTANT:  PROCEDURE DATE:  10/02/2011 PROCEDURE:  Colonoscopy [id number]  INDICATIONS:  PERSONAL HISTORY OF POLYPS  MEDICATIONS:   MAC sedation, administered by CRNA  DESCRIPTION OF PROCEDURE:    Physical exam was performed. Informed consent was obtained from the patient after explaining the benefits, risks, and alternatives to procedure.  The patient was connected to monitor and placed in left lateral position. Continuous oxygen was provided by nasal cannula and IV medicine administered through an indwelling cannula.  After administration of sedation and rectal exam, the patient's rectum was intubated and the  colonoscope was advanced under direct visualization to the cecum.  The scope was removed slowly by carefully examining the color, texture, anatomy, and integrity mucosa on the way out. The patient was recovered in endoscopy and discharged home in satisfactory condition. <<PROCEDUREIMAGES>>  FINDINGS:  FREQUENT Diverticula were found in the sigmoid colon. MODERATE Internal Hemorrhoids were found.  PREP QUALITY: GOOD CECAL W/D TIME:    14 minutes  COMPLICATIONS:    None  ENDOSCOPIC IMPRESSION: MODERATE SIGMODI COLON DIVERTICULOSIS HEMORRHOIDS  RECOMMENDATIONS: HIGH FIBER DIET OPV IN [facility] 2014  REPEAT EXAM:  No  ______________________________ Cheryl Drain, MD  CC:  Cheryl Abbott, M.D.  n. eSIGNED:   Yarel Abbott at 10/02/2011 11:34 AM  Cheryl Abbott, [id number]

## 2011-10-02 NOTE — Anesthesia Procedure Notes (Signed)
Procedure Name: MAC Date/Time: 10/02/2011 10:45 AM Performed by: Vista Deck Pre-anesthesia Checklist: Patient identified, Emergency Drugs available, Suction available, Timeout performed and Patient being monitored Patient Re-evaluated:Patient Re-evaluated prior to inductionOxygen Delivery Method: Non-rebreather mask

## 2011-10-02 NOTE — Transfer of Care (Signed)
Immediate Anesthesia Transfer of Care Note  Patient: Cheryl Abbott  Procedure(s) Performed: Procedure(s) (LRB): COLONOSCOPY WITH PROPOFOL (N/A)  Patient Location: PACU  Anesthesia Type: MAC  Level of Consciousness: awake, alert , oriented and patient cooperative  Airway & Oxygen Therapy: Patient Spontanous Breathing and Patient connected to face mask oxygen  Post-op Assessment: Report given to PACU RN and Post -op Vital signs reviewed and stable  Post vital signs: Reviewed and stable  Complications: No apparent anesthesia complications

## 2011-10-02 NOTE — Anesthesia Postprocedure Evaluation (Signed)
  Anesthesia Post-op Note  Patient: Cheryl Abbott  Procedure(s) Performed: Procedure(s) (LRB): COLONOSCOPY WITH PROPOFOL (N/A)  Patient Location: PACU  Anesthesia Type: MAC  Level of Consciousness: awake, alert , oriented and patient cooperative  Airway and Oxygen Therapy: Patient Spontanous Breathing and Patient connected to face mask oxygen  Post-op Pain: none  Post-op Assessment: Post-op Vital signs reviewed, Patient's Cardiovascular Status Stable, Respiratory Function Stable and Patent Airway  Post-op Vital Signs: Reviewed and stable  Complications: No apparent anesthesia complications

## 2011-10-02 NOTE — Preoperative (Signed)
Beta Blockers   Reason not to administer Beta Blockers:Not Applicable 

## 2011-10-03 ENCOUNTER — Ambulatory Visit: Payer: Medicare Other | Admitting: Family Medicine

## 2011-10-23 ENCOUNTER — Other Ambulatory Visit: Payer: Self-pay

## 2011-10-23 ENCOUNTER — Encounter (HOSPITAL_COMMUNITY): Payer: Self-pay | Admitting: *Deleted

## 2011-10-23 ENCOUNTER — Emergency Department (HOSPITAL_COMMUNITY)
Admission: EM | Admit: 2011-10-23 | Discharge: 2011-10-23 | Disposition: A | Discharge status: Home / Self Care | Payer: Medicare Other | Attending: Emergency Medicine | Admitting: Emergency Medicine

## 2011-10-23 ENCOUNTER — Emergency Department (HOSPITAL_COMMUNITY): Payer: Medicare Other

## 2011-10-23 DIAGNOSIS — E109 Type 1 diabetes mellitus without complications: Secondary | ICD-10-CM | POA: Insufficient documentation

## 2011-10-23 DIAGNOSIS — M79609 Pain in unspecified limb: Secondary | ICD-10-CM | POA: Insufficient documentation

## 2011-10-23 DIAGNOSIS — R0789 Other chest pain: Secondary | ICD-10-CM

## 2011-10-23 DIAGNOSIS — F0390 Unspecified dementia without behavioral disturbance: Secondary | ICD-10-CM | POA: Insufficient documentation

## 2011-10-23 DIAGNOSIS — G4730 Sleep apnea, unspecified: Secondary | ICD-10-CM | POA: Insufficient documentation

## 2011-10-23 DIAGNOSIS — R21 Rash and other nonspecific skin eruption: Secondary | ICD-10-CM | POA: Insufficient documentation

## 2011-10-23 DIAGNOSIS — I10 Essential (primary) hypertension: Secondary | ICD-10-CM | POA: Insufficient documentation

## 2011-10-23 DIAGNOSIS — M79602 Pain in left arm: Secondary | ICD-10-CM

## 2011-10-23 DIAGNOSIS — I2510 Atherosclerotic heart disease of native coronary artery without angina pectoris: Secondary | ICD-10-CM | POA: Insufficient documentation

## 2011-10-23 DIAGNOSIS — R079 Chest pain, unspecified: Secondary | ICD-10-CM | POA: Insufficient documentation

## 2011-10-23 LAB — DIFFERENTIAL
Basophils Absolute: 0.1 10*3/uL (ref 0.0–0.1)
Basophils Relative: 1 % (ref 0–1)
Eosinophils Absolute: 0.3 10*3/uL (ref 0.0–0.7)
Eosinophils Relative: 4 % (ref 0–5)
Lymphocytes Relative: 24 % (ref 12–46)
Lymphs Abs: 1.8 10*3/uL (ref 0.7–4.0)
Monocytes Absolute: 0.7 10*3/uL (ref 0.1–1.0)
Monocytes Relative: 9 % (ref 3–12)
Neutro Abs: 4.7 10*3/uL (ref 1.7–7.7)
Neutrophils Relative %: 62 % (ref 43–77)
Smear Review: INCREASED

## 2011-10-23 LAB — CBC
HCT: 28.8 % — ABNORMAL LOW (ref 36.0–46.0)
Hemoglobin: 8.6 g/dL — ABNORMAL LOW (ref 12.0–15.0)
MCH: 20.8 pg — ABNORMAL LOW (ref 26.0–34.0)
MCHC: 29.9 g/dL — ABNORMAL LOW (ref 30.0–36.0)
MCV: 69.7 fL — ABNORMAL LOW (ref 78.0–100.0)
Platelets: 436 10*3/uL — ABNORMAL HIGH (ref 150–400)
RBC: 4.13 MIL/uL (ref 3.87–5.11)
RDW: 21.1 % — ABNORMAL HIGH (ref 11.5–15.5)
WBC: 7.6 10*3/uL (ref 4.0–10.5)

## 2011-10-23 LAB — COMPREHENSIVE METABOLIC PANEL
ALT: 14 U/L (ref 0–35)
AST: 12 U/L (ref 0–37)
Albumin: 3.5 g/dL (ref 3.5–5.2)
Alkaline Phosphatase: 62 U/L (ref 39–117)
BUN: 16 mg/dL (ref 6–23)
CO2: 24 mEq/L (ref 19–32)
Calcium: 10 mg/dL (ref 8.4–10.5)
Chloride: 101 mEq/L (ref 96–112)
Creatinine, Ser: 1.04 mg/dL (ref 0.50–1.10)
GFR calc Af Amer: 63 mL/min — ABNORMAL LOW (ref >90)
GFR calc non Af Amer: 54 mL/min — ABNORMAL LOW (ref >90)
Glucose, Bld: 187 mg/dL — ABNORMAL HIGH (ref 70–99)
Potassium: 4.3 mEq/L (ref 3.5–5.1)
Sodium: 134 mEq/L — ABNORMAL LOW (ref 135–145)
Total Bilirubin: 0.1 mg/dL — ABNORMAL LOW (ref 0.3–1.2)
Total Protein: 7.9 g/dL (ref 6.0–8.3)

## 2011-10-23 LAB — TROPONIN I: Troponin I: <0.3 ng/mL (ref <0.30)

## 2011-10-23 MED ORDER — OXYCODONE-ACETAMINOPHEN 5-325 MG PO TABS
1.0000 | ORAL_TABLET | Freq: Once | ORAL | Status: AC
  Administered 2011-10-23: 1 via ORAL
  Filled 2011-10-23: qty 1

## 2011-10-23 MED ORDER — TRAMADOL HCL 50 MG PO TABS: 50.0000 mg | ORAL_TABLET | Freq: Four times a day (QID) | ORAL | Status: AC | PRN
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
## 2011-10-23 NOTE — Discharge Instructions (Signed)
Follow up with your md next week. And follow up with an orthopedic person

## 2011-10-25 ENCOUNTER — Ambulatory Visit (HOSPITAL_COMMUNITY)
Admission: RE | Admit: 2011-10-25 | Discharge: 2011-10-25 | Disposition: A | Discharge status: Home / Self Care | Payer: No Typology Code available for payment source | Source: Ambulatory Visit | Attending: Family Medicine | Admitting: Family Medicine

## 2011-10-25 DIAGNOSIS — E119 Type 2 diabetes mellitus without complications: Secondary | ICD-10-CM | POA: Insufficient documentation

## 2011-10-25 DIAGNOSIS — E785 Hyperlipidemia, unspecified: Secondary | ICD-10-CM | POA: Insufficient documentation

## 2011-10-25 DIAGNOSIS — I10 Essential (primary) hypertension: Secondary | ICD-10-CM | POA: Insufficient documentation

## 2011-10-25 DIAGNOSIS — M6281 Muscle weakness (generalized): Secondary | ICD-10-CM | POA: Insufficient documentation

## 2011-10-25 DIAGNOSIS — IMO0001 Reserved for inherently not codable concepts without codable children: Secondary | ICD-10-CM | POA: Insufficient documentation

## 2011-10-25 DIAGNOSIS — M545 Low back pain, unspecified: Secondary | ICD-10-CM | POA: Insufficient documentation

## 2011-10-25 DIAGNOSIS — M546 Pain in thoracic spine: Secondary | ICD-10-CM | POA: Insufficient documentation

## 2011-10-26 DIAGNOSIS — M791 Myalgia, unspecified site: Secondary | ICD-10-CM | POA: Insufficient documentation
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
## 2011-10-30 ENCOUNTER — Ambulatory Visit (HOSPITAL_COMMUNITY)
Admission: RE | Admit: 2011-10-30 | Discharge: 2011-10-30 | Disposition: A | Discharge status: Home / Self Care | Payer: No Typology Code available for payment source | Source: Ambulatory Visit | Attending: Internal Medicine | Admitting: Internal Medicine
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
## 2011-11-01 ENCOUNTER — Ambulatory Visit (HOSPITAL_COMMUNITY)
Admission: RE | Admit: 2011-11-01 | Discharge: 2011-11-01 | Disposition: A | Discharge status: Home / Self Care | Payer: No Typology Code available for payment source | Source: Ambulatory Visit | Attending: Family Medicine | Admitting: Family Medicine
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
## 2011-11-06 ENCOUNTER — Ambulatory Visit: Payer: Medicare Other | Admitting: Family Medicine

## 2011-11-06 ENCOUNTER — Ambulatory Visit (HOSPITAL_COMMUNITY)
Admission: RE | Admit: 2011-11-06 | Discharge: 2011-11-06 | Disposition: A | Discharge status: Home / Self Care | Payer: No Typology Code available for payment source | Source: Ambulatory Visit | Attending: Family Medicine | Admitting: Family Medicine
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
## 2011-11-07 ENCOUNTER — Ambulatory Visit (INDEPENDENT_AMBULATORY_CARE_PROVIDER_SITE_OTHER): Payer: Medicare Other | Admitting: Orthopedic Surgery

## 2011-11-07 ENCOUNTER — Encounter: Payer: Self-pay | Admitting: Orthopedic Surgery

## 2011-11-07 ENCOUNTER — Other Ambulatory Visit: Payer: Self-pay

## 2011-11-07 VITALS — BP 90/60 | Ht 67.0 in | Wt 200.0 lb

## 2011-11-07 DIAGNOSIS — T148XXA Other injury of unspecified body region, initial encounter: Secondary | ICD-10-CM

## 2011-11-07 MED ORDER — ALPRAZOLAM 0.5 MG PO TABS: 0.5000 mg | ORAL_TABLET | Freq: Two times a day (BID) | ORAL | Status: DC

## 2011-11-07 NOTE — Patient Instructions (Signed)
Bone contusion and post traumatic hematoma elbow
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
## 2011-11-08 ENCOUNTER — Ambulatory Visit (HOSPITAL_COMMUNITY)
Admission: RE | Admit: 2011-11-08 | Discharge: 2011-11-08 | Disposition: A | Discharge status: Home / Self Care | Payer: No Typology Code available for payment source | Source: Ambulatory Visit | Attending: Family Medicine | Admitting: Family Medicine

## 2011-11-08 NOTE — Progress Notes (Signed)
Physical Therapy Treatment Patient Details  Name: Cheryl Abbott MRN: [id number] Date of Birth: November 25, 1945  Today's Date: 11/08/2011 Time: [id number] PT Time Calculation (min): 47 min Visit#: 5  of 8   Re-eval: 11/23/11 Charges:  Therex 30', manual 10'   Subjective: Symptoms/Limitations Symptoms: Pt. states went to MD regarding her L elbow yesterday and he ordered 6 therapy treatments for dissolving her hematoma.  Pt. only having pain in her L cervical area with R lateral flexion and extension up to 5/10.  No pain when immobile. Pain Assessment Currently in Pain?: Yes Pain Score:   5 (only with movement) Pain Location: Neck Pain Orientation: Left   Exercise/Treatments Stretches Corner Stretch: 3 reps;30 seconds Neck Exercises Neck Retraction: 10 reps;Limitations Neck Retraction Limitations: with elbow presses Shoulder Extension: 15 reps Theraband Level (Shoulder Extension): Level 3 (Green) Row: 15 reps Theraband Level (Row): Level 3 (Green) Scapular Retraction: 15 reps Theraband Level (Scapular Retraction): Level 3 (Green) W Back: 5 reps X to V: 5 reps Additional Neck Exercises Wall Pushups/Modified Pushups: 10 reps with chin tuck UBE (Upper Arm Bike): 6'@2 .0 (3'fwd,3'bkd)   Manual Therapy Manual Therapy: Massage Massage: STM to L UT and chest region x 12 min   Physical Therapy Assessment and Plan PT Assessment and Plan Clinical Impression Statement: Able to complete all exercises today.  Required tactile/VC's for posture with theraband exercises secondary to weak stabilizers.  Pt. reported being painfree following manual techniques. PT Plan: Add: Supine Chest wall stretch, UE lift w/cervical stabilzation, and prone activites for scapular strength when able.     PT - End of Session Activity Tolerance: Patient tolerated treatment well General Behavior During Session: [facility] for tasks performed Cognition: [facility] for tasks performed   Teena Irani, PTA/CLT 11/08/2011,  3:29 PM

## 2011-11-12 ENCOUNTER — Telehealth: Payer: Self-pay | Admitting: Family Medicine

## 2011-11-12 MED ORDER — TRAMADOL HCL 50 MG PO TABS: 50.0000 mg | ORAL_TABLET | Freq: Two times a day (BID) | ORAL | Status: DC

## 2011-11-12 NOTE — Telephone Encounter (Signed)
Refill sent in per request.  

## 2011-11-13 ENCOUNTER — Ambulatory Visit (HOSPITAL_COMMUNITY)
Admission: RE | Admit: 2011-11-13 | Discharge: 2011-11-13 | Disposition: A | Discharge status: Home / Self Care | Payer: Medicare Other | Source: Ambulatory Visit | Attending: Orthopedic Surgery | Admitting: Orthopedic Surgery

## 2011-11-13 ENCOUNTER — Ambulatory Visit (HOSPITAL_COMMUNITY)
Admission: RE | Admit: 2011-11-13 | Discharge: 2011-11-13 | Disposition: A | Discharge status: Home / Self Care | Payer: Medicare Other | Source: Ambulatory Visit | Attending: Family Medicine | Admitting: Family Medicine

## 2011-11-13 DIAGNOSIS — M6281 Muscle weakness (generalized): Secondary | ICD-10-CM | POA: Insufficient documentation

## 2011-11-13 DIAGNOSIS — T148XXA Other injury of unspecified body region, initial encounter: Secondary | ICD-10-CM | POA: Insufficient documentation

## 2011-11-13 DIAGNOSIS — M545 Low back pain, unspecified: Secondary | ICD-10-CM | POA: Insufficient documentation

## 2011-11-13 DIAGNOSIS — I10 Essential (primary) hypertension: Secondary | ICD-10-CM | POA: Insufficient documentation

## 2011-11-13 DIAGNOSIS — M546 Pain in thoracic spine: Secondary | ICD-10-CM | POA: Insufficient documentation

## 2011-11-13 DIAGNOSIS — IMO0001 Reserved for inherently not codable concepts without codable children: Secondary | ICD-10-CM | POA: Insufficient documentation

## 2011-11-13 DIAGNOSIS — E785 Hyperlipidemia, unspecified: Secondary | ICD-10-CM | POA: Insufficient documentation

## 2011-11-13 DIAGNOSIS — E119 Type 2 diabetes mellitus without complications: Secondary | ICD-10-CM | POA: Insufficient documentation
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
## 2011-11-14 ENCOUNTER — Ambulatory Visit (INDEPENDENT_AMBULATORY_CARE_PROVIDER_SITE_OTHER): Payer: Medicare Other | Admitting: Family Medicine

## 2011-11-14 ENCOUNTER — Encounter: Payer: Self-pay | Admitting: Family Medicine

## 2011-11-14 VITALS — BP 114/80 | HR 77 | Resp 16 | Ht 67.0 in | Wt 207.0 lb

## 2011-11-14 DIAGNOSIS — R5383 Other fatigue: Secondary | ICD-10-CM

## 2011-11-14 DIAGNOSIS — E785 Hyperlipidemia, unspecified: Secondary | ICD-10-CM

## 2011-11-14 DIAGNOSIS — I10 Essential (primary) hypertension: Secondary | ICD-10-CM

## 2011-11-14 DIAGNOSIS — R5381 Other malaise: Secondary | ICD-10-CM

## 2011-11-14 DIAGNOSIS — D649 Anemia, unspecified: Secondary | ICD-10-CM

## 2011-11-14 DIAGNOSIS — E119 Type 2 diabetes mellitus without complications: Secondary | ICD-10-CM

## 2011-11-14 DIAGNOSIS — R51 Headache: Secondary | ICD-10-CM

## 2011-11-14 DIAGNOSIS — R079 Chest pain, unspecified: Secondary | ICD-10-CM

## 2011-11-14 DIAGNOSIS — F411 Generalized anxiety disorder: Secondary | ICD-10-CM

## 2011-11-14 MED ORDER — METFORMIN HCL 500 MG PO TABS: 1000.0000 mg | ORAL_TABLET | Freq: Two times a day (BID) | ORAL | Status: DC

## 2011-11-14 MED ORDER — OMEPRAZOLE 20 MG PO CPDR: 20.0000 mg | DELAYED_RELEASE_CAPSULE | Freq: Every day | ORAL | Status: DC

## 2011-11-14 MED ORDER — AMLODIPINE BESYLATE 5 MG PO TABS: 5.0000 mg | ORAL_TABLET | Freq: Every day | ORAL | Status: DC

## 2011-11-14 NOTE — Patient Instructions (Addendum)
F/u August 25 or  after.  HBa1C and chem 7  Next due August 18  Please work on weight loss , your blood sugar is not as good as it has been in the past   You are being referred to cardiology for further chest pain evaluation.  Fasting lipid, cbc and diff as soon as possible   You will be referred to hematology if your blood count is again abnormal  No mRI for headache at thsi time, if worsens then you will be referred to neurologist, on review of record following visit I have  decided to proceed with brain scan and will make pt aware

## 2011-11-14 NOTE — Progress Notes (Signed)
  Subjective:    Patient ID: Cheryl Abbott, female    DOB: [birth year], 66 y.o.   MRN: [id number]  HPI Pt seen in ED 5/14 for left chest pain,intermitttent occurrence, treated as chest  Wall however ekg was abnormal and she has multiple cardiac risk factors Also concerned about increased frequency and severity of headaches in the past approx 2 to 3 months, as well as increased short term memory loss, requests brain scan. nitially I had felt this was not necessary, however based on concerns and high cv risk will order study Denies polyuria or polydipsia, fasting blood sugar seldom over 130.    Review of Systems See HPI Denies recent fever or chills. Denies sinus pressure, nasal congestion, ear pain or sore throat. Denies chest congestion, productive cough or wheezing.  Denies abdominal pain, nausea, vomiting,diarrhea or constipation.   Denies dysuria, frequency, hesitancy or incontinence. Denies , seizures, numbness, or tingling. Denies uncontrolled  depression, anxiety or insomnia. Denies skin break down or rash.        Objective:   Physical Exam Patient alert and oriented and in no cardiopulmonary distress.Anxious appearing  HEENT: No facial asymmetry, EOMI, no sinus tenderness,  oropharynx pink and moist.  Neck supple no adenopathy.  Chest: Clear to auscultation bilaterally.  CVS: S1, S2 no murmurs, no S3.  ABD: Soft non tender. Bowel sounds normal.  Ext: No edema  MS: Adequate though reduced  ROM spine, shoulders, hips and knees.  Skin: Intact, no ulcerations or rash noted.  Psych: Good eye contact, blunted affect. Memory intact nmildly impaired  anxious but not  depressed appearing.  CNS: CN 2-12 intact, power, tone and sensation normal throughout.        Assessment & Plan:

## 2011-11-15 ENCOUNTER — Ambulatory Visit (HOSPITAL_COMMUNITY)
Admission: RE | Admit: 2011-11-15 | Discharge: 2011-11-15 | Disposition: A | Discharge status: Home / Self Care | Payer: Medicare Other | Source: Ambulatory Visit | Attending: Family Medicine | Admitting: Family Medicine
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
## 2011-11-15 NOTE — Progress Notes (Signed)
Physical Therapy Treatment Patient Details  Name: Cheryl Abbott MRN: [id number] Date of Birth: 1945/07/25  Today's Date: 11/15/2011 Time: [id number] PT Time Calculation (min): 45 min Visit#: 7  of 8   Re-eval: 11/23/11 Charges:  28', manual 15'    Subjective: Symptoms/Limitations Symptoms: Pt. reports her L shoulder is still bothering her quite a bit.  States her chest and elbow are feeling a little better today.  Pt. reports she went to the MD for a check up and her bloodwork showed heart problems and high WBC's.  Pt. is going to see a cardiologist.   Exercise/Treatments Machines for Strengthening UBE (Upper Arm Bike): 6' @ 3.0 3'/3' Theraband Exercises Scapula Retraction: 10 reps;Green Shoulder Extension: 10 reps;Green Rows: 10 reps;Green Standing Exercises Wall Push Ups Limitations: time UE Flexion with Stabilization Limitations: time Prone Exercises W Back: 10 reps Shoulder Extension: 10 reps Rows: 10 reps   Manual Therapy Massage: STM to L UT,and scapular region with patient in prone position.  Physical Therapy Assessment and Plan PT Assessment and Plan Clinical Impression Statement: Added prone stab exercises with noted weakness; resumed standing theraband exercises for strengthening.  Improved relaxation of muscles while in prone position with overall decreased tightness. PT Plan: Re-evaluate next visit.     PT - End of Session Activity Tolerance: Patient tolerated treatment well General Behavior During Session: [facility] for tasks performed Cognition: [facility] for tasks performed  GP No functional reporting required  Teena Irani, PTA/CLT 11/15/2011, 4:44 PM

## 2011-11-16 LAB — CBC WITH DIFFERENTIAL/PLATELET
Basophils Absolute: 0 10*3/uL (ref 0.0–0.1)
Basophils Relative: 1 % (ref 0–1)
Eosinophils Absolute: 1 10*3/uL — ABNORMAL HIGH (ref 0.0–0.7)
Eosinophils Relative: 13 % — ABNORMAL HIGH (ref 0–5)
HCT: 29.7 % — ABNORMAL LOW (ref 36.0–46.0)
Hemoglobin: 9 g/dL — ABNORMAL LOW (ref 12.0–15.0)
Lymphocytes Relative: 23 % (ref 12–46)
Lymphs Abs: 1.7 10*3/uL (ref 0.7–4.0)
MCH: 20.6 pg — ABNORMAL LOW (ref 26.0–34.0)
MCHC: 30.3 g/dL (ref 30.0–36.0)
MCV: 68 fL — ABNORMAL LOW (ref 78.0–100.0)
Monocytes Absolute: 0.7 10*3/uL (ref 0.1–1.0)
Monocytes Relative: 10 % (ref 3–12)
Neutro Abs: 3.9 10*3/uL (ref 1.7–7.7)
Neutrophils Relative %: 53 % (ref 43–77)
Platelets: 522 10*3/uL — ABNORMAL HIGH (ref 150–400)
RBC: 4.37 MIL/uL (ref 3.87–5.11)
RDW: 20.7 % — ABNORMAL HIGH (ref 11.5–15.5)
WBC: 7.3 10*3/uL (ref 4.0–10.5)

## 2011-11-16 LAB — LIPID PANEL
Cholesterol: 152 mg/dL (ref 0–200)
HDL: 36 mg/dL — ABNORMAL LOW (ref >39)
LDL Cholesterol: 92 mg/dL (ref 0–99)
Total CHOL/HDL Ratio: 4.2 Ratio
Triglycerides: 120 mg/dL (ref <150)
VLDL: 24 mg/dL (ref 0–40)

## 2011-11-18 DIAGNOSIS — R519 Headache, unspecified: Secondary | ICD-10-CM | POA: Insufficient documentation

## 2011-11-18 DIAGNOSIS — R51 Headache: Secondary | ICD-10-CM | POA: Insufficient documentation

## 2011-11-18 NOTE — Assessment & Plan Note (Signed)
Controlled, no change in medication  

## 2011-11-18 NOTE — Assessment & Plan Note (Signed)
recdent onset of frequency and severity of headaches, requess MRI of brain , on reconsideration based on expressed concern, along with memory loss will refer for same

## 2011-11-18 NOTE — Assessment & Plan Note (Signed)
Will continue to follow, may need heme eval

## 2011-11-18 NOTE — Assessment & Plan Note (Signed)
Recurrent chest pain recently in Ed with complaint, EKG is abnormal and p has multiple cardiac risk factors , refer to cardiology

## 2011-11-18 NOTE — Assessment & Plan Note (Signed)
Low HDL, pt encouraged to increase and commit to regular exercise once cleared by cardiology, no mmed change

## 2011-11-20 ENCOUNTER — Ambulatory Visit (HOSPITAL_COMMUNITY)
Admission: RE | Admit: 2011-11-20 | Discharge: 2011-11-20 | Disposition: A | Discharge status: Home / Self Care | Payer: Medicare Other | Source: Ambulatory Visit | Attending: Family Medicine | Admitting: Family Medicine

## 2011-11-20 ENCOUNTER — Ambulatory Visit (HOSPITAL_COMMUNITY)
Admission: RE | Admit: 2011-11-20 | Discharge: 2011-11-20 | Disposition: A | Discharge status: Home / Self Care | Payer: Medicare Other | Source: Ambulatory Visit
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
## 2011-11-21 ENCOUNTER — Other Ambulatory Visit: Payer: Self-pay | Admitting: Family Medicine

## 2011-11-21 DIAGNOSIS — R079 Chest pain, unspecified: Secondary | ICD-10-CM

## 2011-11-22 ENCOUNTER — Telehealth: Payer: Self-pay | Admitting: Family Medicine

## 2011-11-22 ENCOUNTER — Ambulatory Visit (HOSPITAL_COMMUNITY): Payer: Medicare Other | Admitting: Physical Therapy

## 2011-11-23 ENCOUNTER — Ambulatory Visit (HOSPITAL_COMMUNITY)
Admission: RE | Admit: 2011-11-23 | Discharge: 2011-11-23 | Disposition: A | Discharge status: Home / Self Care | Payer: Medicare Other | Source: Ambulatory Visit | Attending: Occupational Therapy | Admitting: Occupational Therapy

## 2011-11-23 ENCOUNTER — Other Ambulatory Visit: Payer: Self-pay | Admitting: Family Medicine

## 2011-11-23 DIAGNOSIS — R51 Headache: Secondary | ICD-10-CM

## 2011-11-23 NOTE — Telephone Encounter (Signed)
Order put in for MRI brain without contrast, thanks

## 2011-11-23 NOTE — Progress Notes (Signed)
Cheryl Abbott, PT
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
## 2011-11-26 ENCOUNTER — Inpatient Hospital Stay (HOSPITAL_COMMUNITY): Admission: RE | Admit: 2011-11-26 | Payer: No Typology Code available for payment source | Source: Ambulatory Visit

## 2011-11-27 ENCOUNTER — Ambulatory Visit (HOSPITAL_COMMUNITY)
Admission: RE | Admit: 2011-11-27 | Discharge: 2011-11-27 | Disposition: A | Discharge status: Home / Self Care | Payer: No Typology Code available for payment source | Source: Ambulatory Visit | Attending: Family Medicine | Admitting: Family Medicine

## 2011-11-27 ENCOUNTER — Ambulatory Visit (HOSPITAL_COMMUNITY)
Admission: RE | Admit: 2011-11-27 | Discharge: 2011-11-27 | Disposition: A | Discharge status: Home / Self Care | Payer: Medicare Other | Source: Ambulatory Visit | Attending: Family Medicine | Admitting: Family Medicine

## 2011-11-27 DIAGNOSIS — G319 Degenerative disease of nervous system, unspecified: Secondary | ICD-10-CM | POA: Insufficient documentation

## 2011-11-27 DIAGNOSIS — R413 Other amnesia: Secondary | ICD-10-CM | POA: Insufficient documentation

## 2011-11-27 DIAGNOSIS — R51 Headache: Secondary | ICD-10-CM | POA: Insufficient documentation
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
## 2011-11-28 ENCOUNTER — Ambulatory Visit (INDEPENDENT_AMBULATORY_CARE_PROVIDER_SITE_OTHER): Payer: No Typology Code available for payment source | Admitting: Cardiovascular Disease

## 2011-11-28 ENCOUNTER — Telehealth (INDEPENDENT_AMBULATORY_CARE_PROVIDER_SITE_OTHER): Payer: Self-pay | Admitting: *Deleted

## 2011-11-28 ENCOUNTER — Ambulatory Visit: Payer: No Typology Code available for payment source | Admitting: Cardiovascular Disease

## 2011-11-28 ENCOUNTER — Encounter: Payer: Self-pay | Admitting: Cardiovascular Disease

## 2011-11-28 VITALS — BP 122/84 | HR 89 | Resp 18 | Ht 67.0 in | Wt 207.0 lb

## 2011-11-28 DIAGNOSIS — R079 Chest pain, unspecified: Secondary | ICD-10-CM

## 2011-11-28 DIAGNOSIS — I10 Essential (primary) hypertension: Secondary | ICD-10-CM

## 2011-11-28 DIAGNOSIS — R51 Headache: Secondary | ICD-10-CM
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
## 2011-11-28 NOTE — Assessment & Plan Note (Signed)
MRI ordered.  F/U Dr Moshe Cipro

## 2011-11-28 NOTE — Assessment & Plan Note (Signed)
Cholesterol is at goal.  Continue current dose of statin and diet Rx.  No myalgias or side effects.  F/U  LFT's in 6 months. Lab Results  Component Value Date   LDLCALC 92 11/14/2011

## 2011-11-28 NOTE — Telephone Encounter (Signed)
Called and left message on voicemail as requested with mr results.

## 2011-11-28 NOTE — Patient Instructions (Addendum)
**Note De-Identified  Obfuscation** Your physician has requested that you have a dobutamine echocardiogram. For further information please visit [email or web address]. Please follow instruction sheet as given.  Your physician recommends that you continue on your current medications as directed. Please refer to the Current Medication list given to you today.  Your physician recommends that you schedule a follow-up appointment in: as needed

## 2011-11-28 NOTE — Assessment & Plan Note (Signed)
Well controlled.  Continue current medications and low sodium Dash type diet.    

## 2011-11-28 NOTE — Assessment & Plan Note (Signed)
Atypical difficulty ambulating  Multiple risk factors F/U dobutamine echo also R/O pericardial disease with this

## 2011-11-28 NOTE — Telephone Encounter (Signed)
Would like to get her MRI results. May leave a message.

## 2011-11-28 NOTE — Assessment & Plan Note (Signed)
Discussed low carb diet.  Target hemoglobin A1c is 6.5 or less.  Continue current medications.  

## 2011-11-29 ENCOUNTER — Ambulatory Visit (HOSPITAL_COMMUNITY)
Admission: RE | Admit: 2011-11-29 | Discharge: 2011-11-29 | Disposition: A | Discharge status: Home / Self Care | Payer: Medicare Other | Source: Ambulatory Visit | Attending: Family Medicine | Admitting: Family Medicine
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
## 2011-12-04 ENCOUNTER — Ambulatory Visit (HOSPITAL_COMMUNITY)
Admission: RE | Admit: 2011-12-04 | Discharge: 2011-12-04 | Disposition: A | Discharge status: Home / Self Care | Payer: Medicare Other | Source: Ambulatory Visit | Attending: Family Medicine | Admitting: Family Medicine
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
## 2011-12-05 ENCOUNTER — Ambulatory Visit (HOSPITAL_COMMUNITY): Admission: RE | Admit: 2011-12-05 | Payer: Medicare Other | Source: Ambulatory Visit

## 2011-12-07 ENCOUNTER — Ambulatory Visit: Payer: No Typology Code available for payment source | Admitting: Urology

## 2011-12-12 ENCOUNTER — Encounter (HOSPITAL_COMMUNITY): Payer: Self-pay | Admitting: Cardiovascular Disease

## 2011-12-12 ENCOUNTER — Ambulatory Visit (HOSPITAL_COMMUNITY)
Admission: RE | Admit: 2011-12-12 | Discharge: 2011-12-12 | Disposition: A | Discharge status: Home / Self Care | Payer: Medicare Other | Source: Ambulatory Visit | Attending: Cardiovascular Disease | Admitting: Cardiovascular Disease

## 2011-12-12 DIAGNOSIS — R079 Chest pain, unspecified: Secondary | ICD-10-CM

## 2011-12-12 DIAGNOSIS — E119 Type 2 diabetes mellitus without complications: Secondary | ICD-10-CM | POA: Insufficient documentation

## 2011-12-12 DIAGNOSIS — I10 Essential (primary) hypertension: Secondary | ICD-10-CM | POA: Insufficient documentation

## 2011-12-12 DIAGNOSIS — R072 Precordial pain: Secondary | ICD-10-CM

## 2011-12-12 DIAGNOSIS — E785 Hyperlipidemia, unspecified: Secondary | ICD-10-CM | POA: Insufficient documentation

## 2011-12-12 MED ORDER — DOBUTAMINE IN D5W 4-5 MG/ML-% IV SOLN
INTRAVENOUS | Status: AC
  Administered 2011-12-12: 12:00:00 via INTRAVENOUS
  Administered 2011-12-12: 10 ug/kg/min via INTRAVENOUS
  Filled 2011-12-12: qty 250

## 2011-12-12 MED ORDER — SODIUM CHLORIDE 0.9 % IJ SOLN
INTRAMUSCULAR | Status: AC
  Administered 2011-12-12: 10 mL via INTRAVENOUS
  Filled 2011-12-12: qty 10

## 2011-12-12 NOTE — Progress Notes (Signed)
Stress Lab Nurses Notes - [place] 12/12/2011 Reason for doing test: Chest Pain Type of test: Dobutamine Myoview Nurse performing test: Gerrit Halls, RN Nuclear Medicine Tech: Not Applicable Echo Tech: judith MD performing test: Myles Gip Family MD: Moshe Cipro Test explained and consent signed: yes IV started: 22g jelco, Saline lock flushed, KVO and No redness or edema Symptoms: Nausea Treatment/Intervention: None Reason test stopped: protocol completed and reached target HR After recovery IV was: Discontinued and No redness or edema Patient to return to [place]. Med at :NA Patient discharged: Home Patient's Condition upon discharge was: stable Comments: During test peak BP 122/68 & HR 139.  Recovery BP 118/68 & HR 90.  Symptoms resolved in recovery. Geanie Cooley T

## 2011-12-17 ENCOUNTER — Telehealth: Payer: Self-pay | Admitting: Cardiovascular Disease

## 2011-12-17 NOTE — Telephone Encounter (Signed)
Pt rtn your call and she said just leave the message on machine she will get it

## 2011-12-17 NOTE — Telephone Encounter (Signed)
Forwarding to Christine.

## 2011-12-17 NOTE — Telephone Encounter (Signed)
LEFT MESSAGE ON VOICE MAIL OF TEST RESULTS./CY

## 2011-12-25 ENCOUNTER — Ambulatory Visit: Payer: No Typology Code available for payment source | Admitting: Family Medicine

## 2011-12-31 ENCOUNTER — Other Ambulatory Visit: Payer: Self-pay | Admitting: Family Medicine

## 2012-01-03 ENCOUNTER — Other Ambulatory Visit: Payer: Self-pay

## 2012-01-03 MED ORDER — ALPRAZOLAM 0.5 MG PO TABS: 0.5000 mg | ORAL_TABLET | Freq: Two times a day (BID) | ORAL | Status: DC

## 2012-01-20 ENCOUNTER — Other Ambulatory Visit: Payer: Self-pay | Admitting: Family Medicine

## 2012-01-28 ENCOUNTER — Ambulatory Visit: Payer: Medicare Other | Admitting: Family Medicine

## 2012-02-06 ENCOUNTER — Ambulatory Visit: Payer: Medicare Other | Admitting: Family Medicine

## 2012-02-07 ENCOUNTER — Encounter: Payer: Self-pay | Admitting: Family Medicine

## 2012-02-07 ENCOUNTER — Ambulatory Visit (INDEPENDENT_AMBULATORY_CARE_PROVIDER_SITE_OTHER): Payer: Medicare Other | Admitting: Family Medicine

## 2012-02-07 VITALS — BP 130/82 | HR 102 | Resp 16 | Ht 67.0 in | Wt 202.0 lb

## 2012-02-07 DIAGNOSIS — B369 Superficial mycosis, unspecified: Secondary | ICD-10-CM | POA: Insufficient documentation

## 2012-02-07 DIAGNOSIS — Z13820 Encounter for screening for osteoporosis: Secondary | ICD-10-CM

## 2012-02-07 DIAGNOSIS — R9402 Abnormal brain scan: Secondary | ICD-10-CM | POA: Insufficient documentation

## 2012-02-07 DIAGNOSIS — R9409 Abnormal results of other function studies of central nervous system: Secondary | ICD-10-CM

## 2012-02-07 DIAGNOSIS — I10 Essential (primary) hypertension: Secondary | ICD-10-CM

## 2012-02-07 DIAGNOSIS — D649 Anemia, unspecified: Secondary | ICD-10-CM

## 2012-02-07 DIAGNOSIS — E119 Type 2 diabetes mellitus without complications: Secondary | ICD-10-CM

## 2012-02-07 DIAGNOSIS — E785 Hyperlipidemia, unspecified: Secondary | ICD-10-CM

## 2012-02-07 DIAGNOSIS — E669 Obesity, unspecified: Secondary | ICD-10-CM

## 2012-02-07 HISTORY — DX: Abnormal brain scan: R94.02

## 2012-02-07 MED ORDER — CLOTRIMAZOLE-BETAMETHASONE 1-0.05 % EX CREA: TOPICAL_CREAM | Freq: Two times a day (BID) | CUTANEOUS | Status: DC

## 2012-02-07 NOTE — Patient Instructions (Addendum)
Annual wellness exam in December.  Flu shots  Will be available by October.   hBA1C ,  Fasting lipid, cmp and eGFR, tSH as soon as possible.  You are referred for a bone density scan, also your mammogram is due in October , call in September and schedule your mammogram.  Please have fasting labs done the day you get the bone density scan   You are referred to Dr Merlene Laughter re abnormal brain scan, unsteady gait and some forgetfullness

## 2012-02-07 NOTE — Progress Notes (Signed)
  Subjective:    Patient ID: Cheryl Abbott, female    DOB: 03/03/1946, 66 y.o.   MRN: [id number]  HPI The PT is here for follow up and re-evaluation of chronic medical conditions, medication management and review of any available recent lab and radiology data.  Preventive health is updated, specifically  Cancer screening and Immunization.   Questions or concerns regarding consultations or procedures which the PT has had in the interim are  addressed. The PT denies any adverse reactions to current medications since the last visit.  States her back and chest pain and left elbow pain have improved with physical therapy. Concerned about memory loss, MMSE is 28, which is reassuring , still has some headaches, though improved, ibuprofen helps, her recent ct scan of brain is abn so she is referred for neurology eval also experiences unsteady gait. Denies polyuria, polydipsia, or blurred vision, blood sugars are reportedly within norm , when tested     Review of Systems See HPI Denies recent fever or chills. Denies sinus pressure, nasal congestion, ear pain or sore throat. Denies chest congestion, productive cough or wheezing. Denies chest pains, palpitations and leg swelling Denies abdominal pain, nausea, vomiting,diarrhea or constipation.   Denies dysuria, frequency, hesitancy or incontinence. .  Denies skin break down or rash.        Objective:   Physical Exam  Patient alert and oriented and in no cardiopulmonary distress.  HEENT: No facial asymmetry, EOMI, no sinus tenderness,  oropharynx pink and moist.  Neck supple no adenopathy.  Chest: Clear to auscultation bilaterally.  CVS: S1, S2 no murmurs, no S3.  ABD: Soft non tender. Bowel sounds normal.  Ext: No edema  MS:  Though reduced ROM spine, shoulders, hips and knees.  Skin: Intact, no ulcerations or rash noted.  Psych: Good eye contact, normal affect. Memory intact not anxious or depressed appearing.  CNS: CN  2-12 intact, power, tone and sensation normal throughout. Diabetic Foot Check:  Appearance - no lesions, ulcers or calluses Skin - no unusual pallor or redness Sensation - grossly intact to light touch Monofilament testing -  Right - Great toe, medial, central, lateral ball and posterior foot diminished slightly Left - Great toe, medial, central, lateral ball and posterior foot diminished slightly Pulses Left - Dorsalis Pedis and Posterior Tibia normal Right - Dorsalis Pedis and Posterior Tibia normal       Assessment & Plan:

## 2012-02-08 ENCOUNTER — Other Ambulatory Visit: Payer: Self-pay

## 2012-02-08 MED ORDER — LOVASTATIN 40 MG PO TABS: 40.0000 mg | ORAL_TABLET | Freq: Every day | ORAL | Status: DC

## 2012-02-08 MED ORDER — TRAMADOL HCL 50 MG PO TABS: 50.0000 mg | ORAL_TABLET | Freq: Two times a day (BID) | ORAL | Status: DC

## 2012-02-08 MED ORDER — METFORMIN HCL 500 MG PO TABS: 1000.0000 mg | ORAL_TABLET | Freq: Two times a day (BID) | ORAL | Status: DC

## 2012-02-08 MED ORDER — ALPRAZOLAM 0.5 MG PO TABS: 0.5000 mg | ORAL_TABLET | Freq: Two times a day (BID) | ORAL | Status: DC

## 2012-02-12 ENCOUNTER — Other Ambulatory Visit: Payer: Self-pay | Admitting: Family Medicine

## 2012-02-12 DIAGNOSIS — Z139 Encounter for screening, unspecified: Secondary | ICD-10-CM

## 2012-02-12 LAB — COMPLETE METABOLIC PANEL WITH GFR
ALT: 16 U/L (ref 0–35)
AST: 16 U/L (ref 0–37)
Albumin: 4.3 g/dL (ref 3.5–5.2)
Alkaline Phosphatase: 86 U/L (ref 39–117)
BUN: 12 mg/dL (ref 6–23)
CO2: 25 mEq/L (ref 19–32)
Calcium: 10 mg/dL (ref 8.4–10.5)
Chloride: 104 mEq/L (ref 96–112)
Creat: 0.92 mg/dL (ref 0.50–1.10)
GFR, Est African American: 75 mL/min
GFR, Est Non African American: 65 mL/min
Glucose, Bld: 90 mg/dL (ref 70–99)
Potassium: 4.3 mEq/L (ref 3.5–5.3)
Sodium: 139 mEq/L (ref 135–145)
Total Bilirubin: 0.2 mg/dL — ABNORMAL LOW (ref 0.3–1.2)
Total Protein: 8 g/dL (ref 6.0–8.3)

## 2012-02-12 LAB — TSH: TSH: 0.997 u[IU]/mL (ref 0.350–4.500)

## 2012-02-12 LAB — LIPID PANEL
Cholesterol: 206 mg/dL — ABNORMAL HIGH (ref 0–200)
HDL: 36 mg/dL — ABNORMAL LOW (ref >39)
LDL Cholesterol: 129 mg/dL — ABNORMAL HIGH (ref 0–99)
Total CHOL/HDL Ratio: 5.7 Ratio
Triglycerides: 203 mg/dL — ABNORMAL HIGH (ref <150)
VLDL: 41 mg/dL — ABNORMAL HIGH (ref 0–40)

## 2012-02-12 LAB — HEMOGLOBIN A1C
Hgb A1c MFr Bld: 6.8 % — ABNORMAL HIGH (ref <5.7)
Mean Plasma Glucose: 148 mg/dL — ABNORMAL HIGH (ref <117)

## 2012-02-13 ENCOUNTER — Other Ambulatory Visit: Payer: Self-pay | Admitting: Family Medicine

## 2012-02-13 ENCOUNTER — Telehealth: Payer: Self-pay | Admitting: Family Medicine

## 2012-02-14 ENCOUNTER — Other Ambulatory Visit: Payer: Self-pay

## 2012-02-14 MED ORDER — LOVASTATIN 40 MG PO TABS: ORAL_TABLET | ORAL | Status: DC

## 2012-02-14 NOTE — Telephone Encounter (Signed)
pls make pt aware of the fact that we do not get involved in write offs for other practices, and she needs to addres this with his office. Lease refer to neurologist in [place] for same reason, pls ask if she is willing/able to go to [place], no other neurologist locally

## 2012-02-14 NOTE — Telephone Encounter (Signed)
Will forward 

## 2012-02-15 ENCOUNTER — Other Ambulatory Visit (HOSPITAL_COMMUNITY): Payer: Medicare Other

## 2012-02-15 NOTE — Telephone Encounter (Signed)
Patient advised and also agreed to go to [facility] for Neuro appt.

## 2012-02-19 ENCOUNTER — Other Ambulatory Visit: Payer: Self-pay | Admitting: Family Medicine

## 2012-02-19 ENCOUNTER — Ambulatory Visit (HOSPITAL_COMMUNITY)
Admission: RE | Admit: 2012-02-19 | Discharge: 2012-02-19 | Disposition: A | Discharge status: Home / Self Care | Payer: Medicare Other | Source: Ambulatory Visit | Attending: Family Medicine | Admitting: Family Medicine

## 2012-02-19 DIAGNOSIS — Z13820 Encounter for screening for osteoporosis: Secondary | ICD-10-CM | POA: Insufficient documentation

## 2012-02-19 DIAGNOSIS — M899 Disorder of bone, unspecified: Secondary | ICD-10-CM | POA: Insufficient documentation

## 2012-02-19 DIAGNOSIS — E559 Vitamin D deficiency, unspecified: Secondary | ICD-10-CM | POA: Insufficient documentation

## 2012-02-25 ENCOUNTER — Telehealth: Payer: Self-pay | Admitting: Family Medicine

## 2012-02-27 ENCOUNTER — Other Ambulatory Visit: Payer: Self-pay

## 2012-02-27 MED ORDER — TRAMADOL HCL 50 MG PO TABS: 50.0000 mg | ORAL_TABLET | Freq: Two times a day (BID) | ORAL | Status: DC

## 2012-02-29 IMAGING — US US ABDOMEN COMPLETE
1 series · 13 of 25 positions shown · non-contrast
Comparison: Unenhanced CT abdomen and pelvis 06/25/2006.

CLINICAL DATA: Abdominal pain.  Nausea and vomiting.  History of
urinary tract calculi.

COMPLETE ABDOMINAL ULTRASOUND 09/19/2009:

[Series 1: us abdomen complete · 0.30mm/px · 68 acquisitions, 13 frames shown]
[im 1/68]
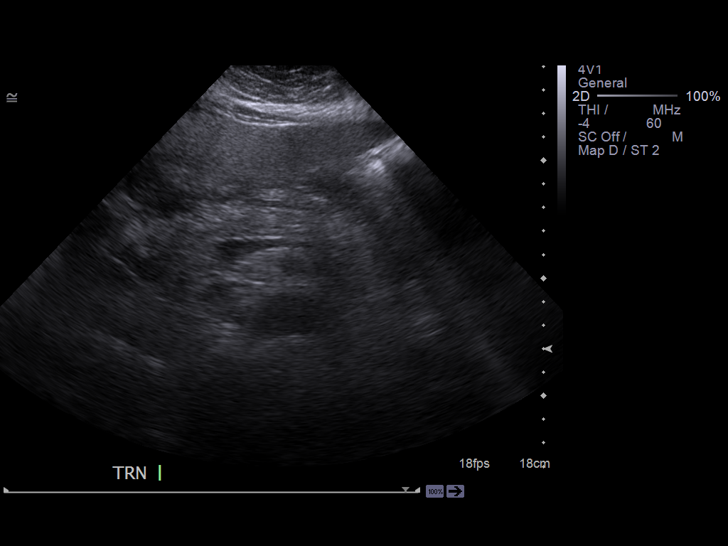
[im 6/68]
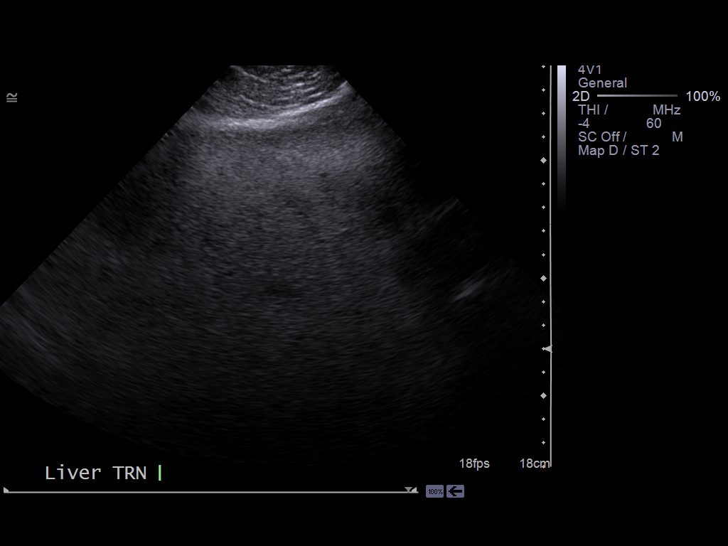
[im 12/68]
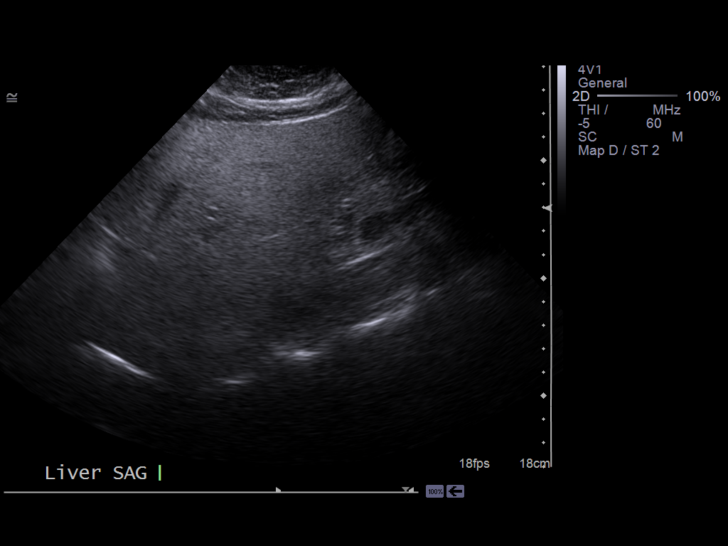
[im 17/68]
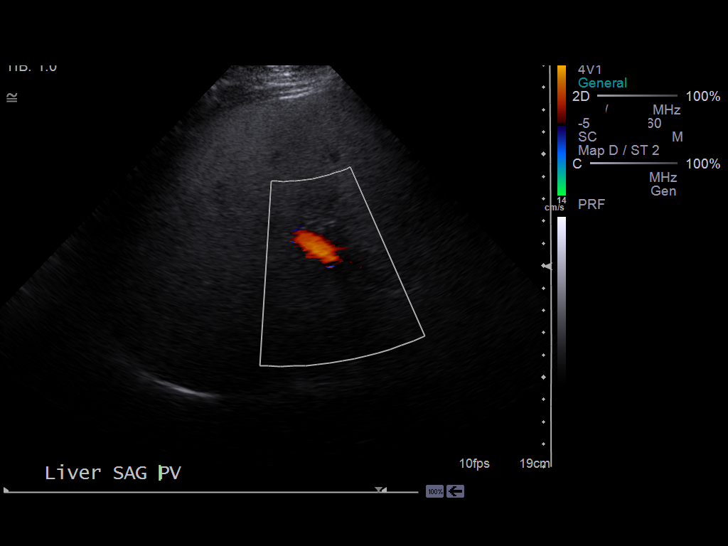
[im 23/68]
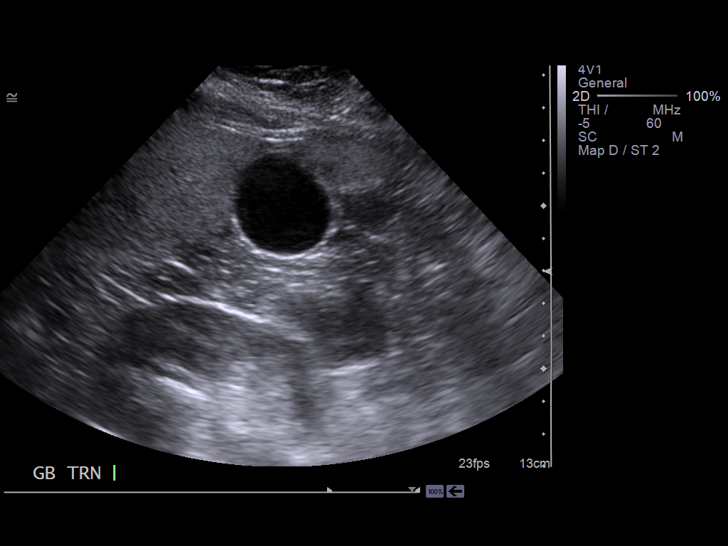
[im 28/68]
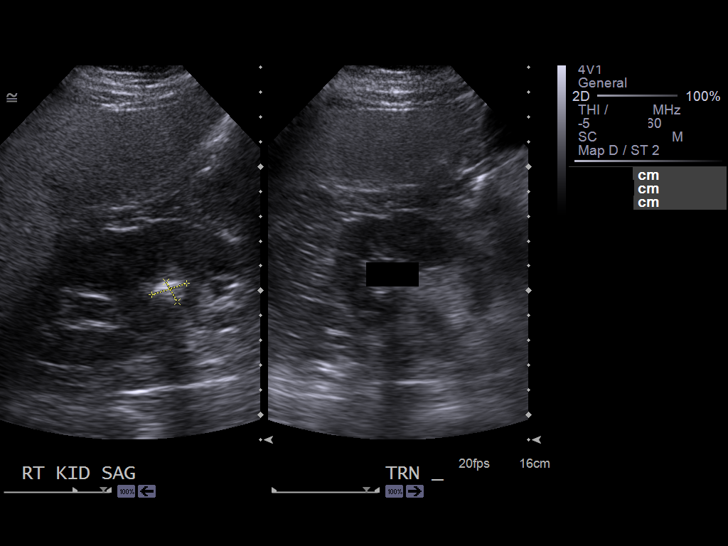
[im 34/68]
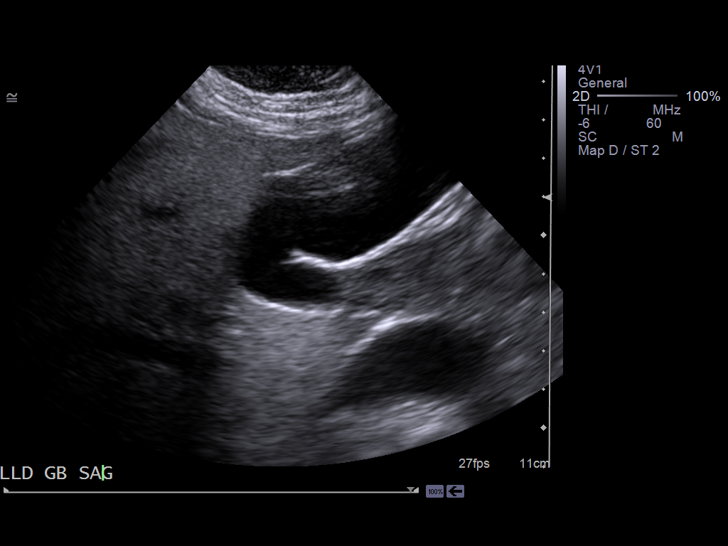
[im 40/68]
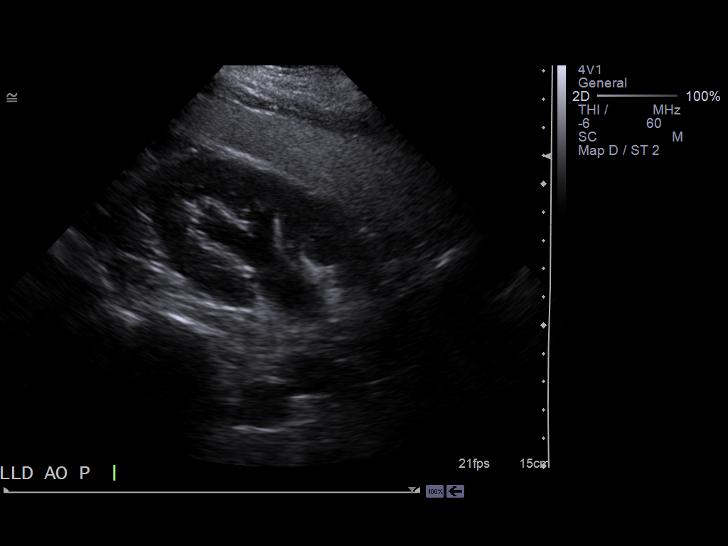
[im 45/68]
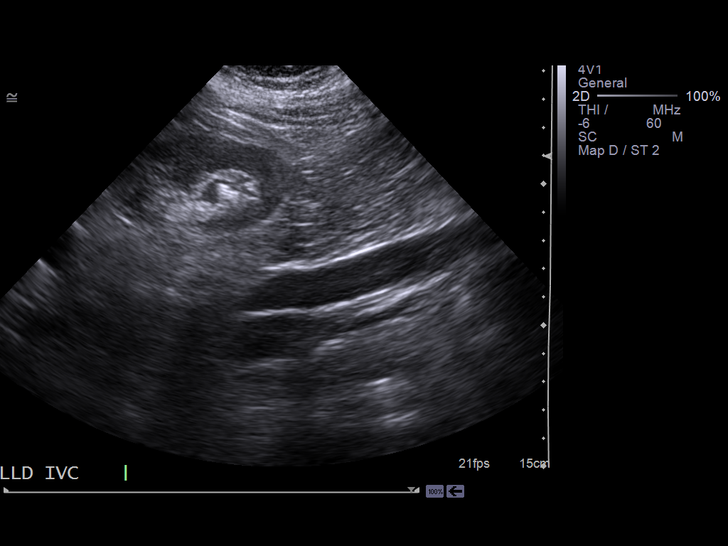
[im 51/68]
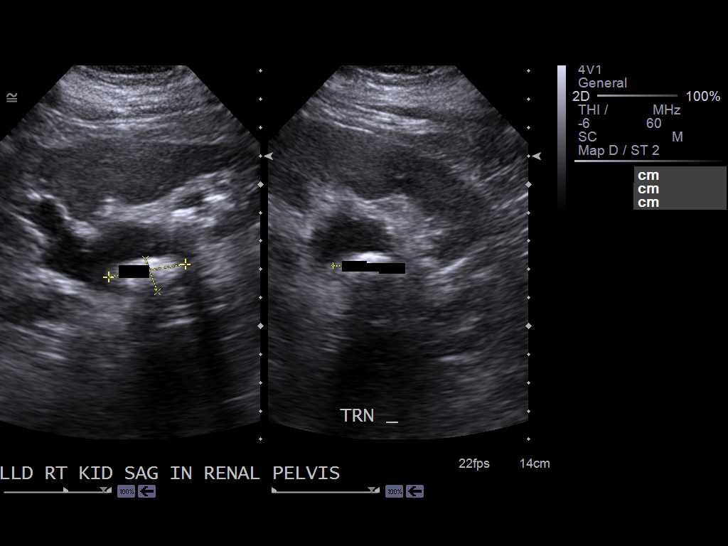
[im 56/68]
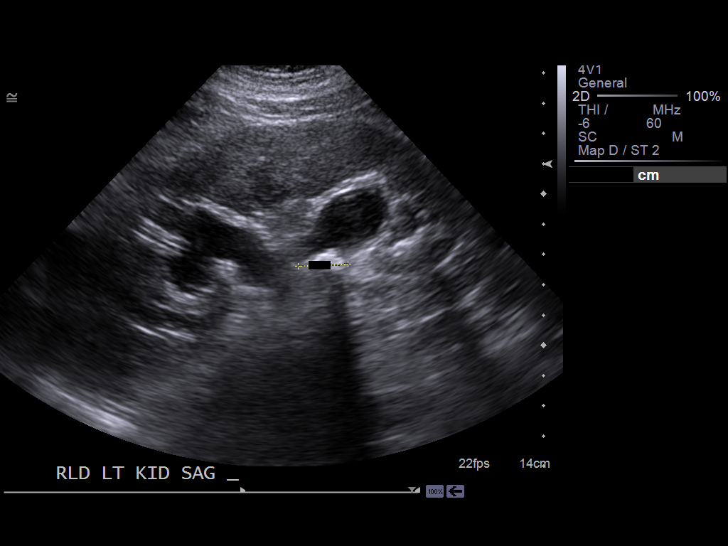
[im 62/68]
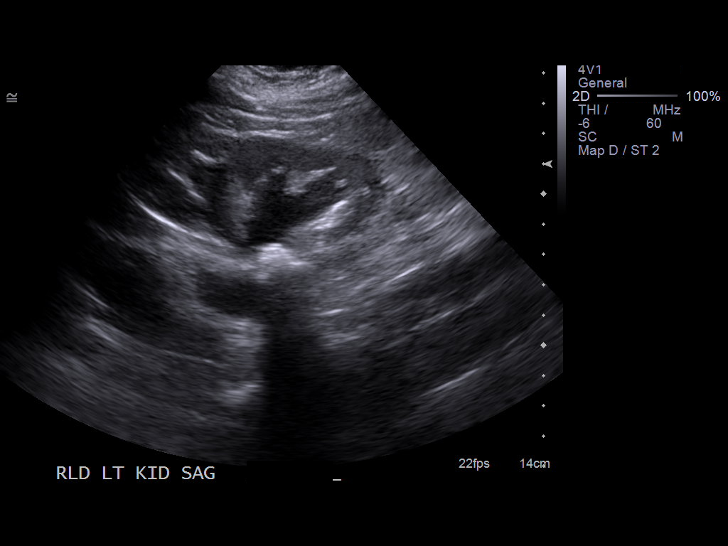
[im 68/68]
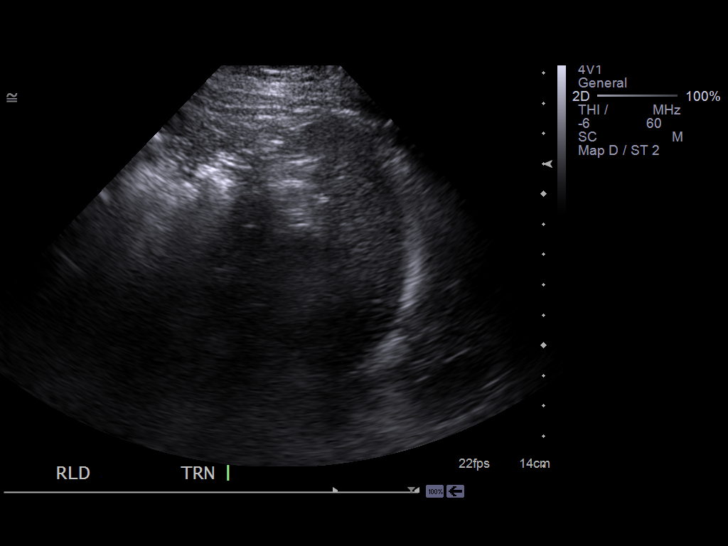

[13 of 25 positions shown; findings below may reference images not displayed]

Report
of ultrasound 02/23/2003 describing a nonobstructing right upper
pole renal calculus and mild diffuse fatty infiltration of the
liver.
FINDINGS: Gallbladder:  No shadowing gallstones.  Small amount of echogenic
sludge.  No gallbladder wall thickening or pericholecystic fluid.

Common bile duct:  Normal in caliber with maximum diameter
approximating 3 mm.

Liver:  Diffusely increased and coarsened echotexture without focal
parenchymal abnormality.  Borderline hepatic enlargement.  Patent
portal vein with hepatopetal flow.

IVC:  Patent.

Pancreas:  Although the pancreas is difficult to visualize in its
entirety, no focal pancreatic abnormality is identified.

Spleen:  Normal size and echotexture without focal parenchymal
abnormality.

Right Kidney:  Approximate 1.5 cm shadowing calculus in an upper
pole calix.  Approximate 2.8 cm shadowing calculus within the renal
pelvis.  Moderate hydronephrosis, new since the prior CT.  Normal
parenchymal echotexture without focal parenchymal abnormality.

Left Kidney:  Approximate 1.7 cm shadowing calculus in the renal
pelvis.  Moderate hydronephrosis, new since the prior CT.  Normal
parenchymal echotexture without focal parenchymal abnormality.

Abdominal aorta:  Normal in caliber throughout its visualized
course the abdomen.
IMPRESSION: 1.  Likely obstructing approximate 2.8 cm calculus in the right
renal pelvis, as there is associated moderate hydronephrosis.
2.  Likely obstructing approximate 1.7 cm calculus in the left
renal pelvis, as there is associated moderate hydronephrosis.
3.  Gallbladder sludge.  No sonographic evidence of cholelithiasis
or acute cholecystitis.  No biliary ductal dilation.
4.  Diffuse fatty infiltration of the liver and/or hepatocellular
disease without focal hepatic parenchymal abnormality.  Patent
portal vein with hepatopetal flow.

## 2012-03-08 IMAGING — NM NM HEPATO W/GB/PHARM/[PERSON_NAME]
2 series · 12 of 12 positions shown · non-contrast
Comparison: 07/16/2006

CLINICAL DATA: Abdominal pain

NUCLEAR MEDICINE HEPATOBILIARY IMAGING WITH GALLBLADDER EF:
TECHNIQUE: Sequential images of the abdomen were obtained for 60
minutes following intravenous administration of
radiopharmaceutical.  Patient then received an infusion of
ugm/kg of CCK analog intravenously over 30 minutes, and imaging was
continued for 30 minutes.  A time-activity curve was generated from
tracer within the gallbladder following CCK administration, and the
gallbladder ejection fraction was calculated.
Radiopharmaceutical:  5.64 mCi Zc-YYm mebrofenin
Pharmaceutical:  1.86 mcg CCK

[Series 1: hida · 3.20mm/px · 6 of 30 frames shown (1 of 2)]
[frame 3/30]
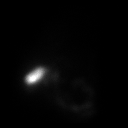
[frame 8/30]
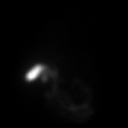
[frame 13/30]
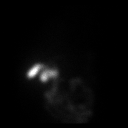
[frame 18/30]
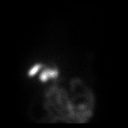
[frame 23/30]
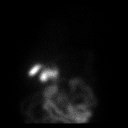
[frame 28/30]
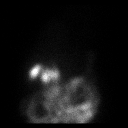

[Series 1: hida · 3.20mm/px · 6 of 60 frames shown (2 of 2)]
[frame 6/60]
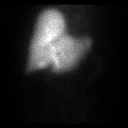
[frame 16/60]
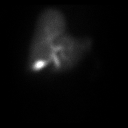
[frame 26/60]
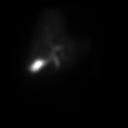
[frame 36/60]
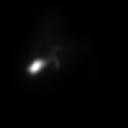
[frame 46/60]
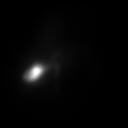
[frame 56/60]
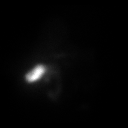

[12 of 12 positions shown; findings below may reference images not displayed]

FINDINGS: Prompt tracer extraction from bloodstream, indicating normal
hepatocellular function.
Prompt excretion of tracer into biliary tree.
Gallbladder visualized at 10 minutes.
Small bowel visualized at 55 minutes.
No hepatic retention of tracer.

Subjectively normal emptying of tracer from gallbladder following
CCK stimulation.
Calculated gallbladder ejection fraction is 84%, normal.
Patient did not experience symptoms following CCK administration.
IMPRESSION: Normal exam, unchanged.

Normal values for gallbladder ejection fraction:
> 30% for exams utilizing sincalide (CCK)
> 50% for exams utilizing fatty meal stimulation

## 2012-03-10 ENCOUNTER — Telehealth: Payer: Self-pay | Admitting: Family Medicine

## 2012-03-10 NOTE — Telephone Encounter (Signed)
pls send referral from 02/11/2012, pls see in referral box, for abnormal brain scan to [facility] per pt request

## 2012-03-11 ENCOUNTER — Ambulatory Visit (INDEPENDENT_AMBULATORY_CARE_PROVIDER_SITE_OTHER): Payer: Medicare Other | Admitting: Urology

## 2012-03-11 ENCOUNTER — Ambulatory Visit (HOSPITAL_COMMUNITY)
Admission: RE | Admit: 2012-03-11 | Discharge: 2012-03-11 | Disposition: A | Discharge status: Home / Self Care | Payer: Medicare Other | Source: Ambulatory Visit | Attending: Urology | Admitting: Urology

## 2012-03-11 ENCOUNTER — Other Ambulatory Visit: Payer: Self-pay | Admitting: Urology

## 2012-03-11 DIAGNOSIS — R109 Unspecified abdominal pain: Secondary | ICD-10-CM | POA: Insufficient documentation

## 2012-03-11 DIAGNOSIS — N200 Calculus of kidney: Secondary | ICD-10-CM | POA: Insufficient documentation

## 2012-03-12 ENCOUNTER — Telehealth: Payer: Self-pay | Admitting: Family Medicine

## 2012-03-12 NOTE — Telephone Encounter (Signed)
I suspect this is Oct 1 abd xray ordered by urology, recommend she discuss with ordering Doc

## 2012-03-12 NOTE — Telephone Encounter (Signed)
Please comment on results and I will call patient back

## 2012-03-13 NOTE — Telephone Encounter (Signed)
PAtient aware 

## 2012-03-17 ENCOUNTER — Ambulatory Visit (HOSPITAL_COMMUNITY): Payer: Medicare Other

## 2012-03-24 ENCOUNTER — Ambulatory Visit (HOSPITAL_COMMUNITY)
Admission: RE | Admit: 2012-03-24 | Discharge: 2012-03-24 | Disposition: A | Discharge status: Home / Self Care | Payer: Medicare Other | Source: Ambulatory Visit | Attending: Family Medicine | Admitting: Family Medicine

## 2012-03-24 DIAGNOSIS — Z139 Encounter for screening, unspecified: Secondary | ICD-10-CM

## 2012-03-24 DIAGNOSIS — Z1231 Encounter for screening mammogram for malignant neoplasm of breast: Secondary | ICD-10-CM | POA: Insufficient documentation

## 2012-03-25 ENCOUNTER — Other Ambulatory Visit: Payer: Self-pay | Admitting: Family Medicine

## 2012-03-27 ENCOUNTER — Telehealth: Payer: Self-pay | Admitting: Family Medicine

## 2012-03-28 ENCOUNTER — Telehealth: Payer: Self-pay | Admitting: Family Medicine

## 2012-03-28 MED ORDER — AMLODIPINE BESYLATE 5 MG PO TABS: 5.0000 mg | ORAL_TABLET | Freq: Every day | ORAL | Status: DC

## 2012-03-28 MED ORDER — OMEPRAZOLE 20 MG PO CPDR: 20.0000 mg | DELAYED_RELEASE_CAPSULE | Freq: Every day | ORAL | Status: DC

## 2012-03-28 MED ORDER — METFORMIN HCL 500 MG PO TABS: 1000.0000 mg | ORAL_TABLET | Freq: Two times a day (BID) | ORAL | Status: DC

## 2012-03-28 NOTE — Telephone Encounter (Signed)
reqested meds already been faxed

## 2012-03-28 NOTE — Telephone Encounter (Signed)
meds refilled 

## 2012-04-14 DIAGNOSIS — F329 Major depressive disorder, single episode, unspecified: Secondary | ICD-10-CM

## 2012-04-14 DIAGNOSIS — R413 Other amnesia: Secondary | ICD-10-CM

## 2012-04-14 DIAGNOSIS — F3289 Other specified depressive episodes: Secondary | ICD-10-CM

## 2012-04-19 ENCOUNTER — Other Ambulatory Visit: Payer: Self-pay | Admitting: Family Medicine

## 2012-05-01 ENCOUNTER — Other Ambulatory Visit: Payer: Self-pay | Admitting: Family Medicine

## 2012-05-15 ENCOUNTER — Encounter: Payer: Self-pay | Admitting: Family Medicine

## 2012-05-15 ENCOUNTER — Ambulatory Visit (INDEPENDENT_AMBULATORY_CARE_PROVIDER_SITE_OTHER): Payer: Medicare Other | Admitting: Family Medicine

## 2012-05-15 VITALS — BP 130/80 | HR 85 | Resp 18 | Ht 67.0 in | Wt 204.1 lb

## 2012-05-15 DIAGNOSIS — E785 Hyperlipidemia, unspecified: Secondary | ICD-10-CM

## 2012-05-15 DIAGNOSIS — Z0000 Encounter for general adult medical examination without abnormal findings: Secondary | ICD-10-CM

## 2012-05-15 DIAGNOSIS — E119 Type 2 diabetes mellitus without complications: Secondary | ICD-10-CM

## 2012-05-15 NOTE — Patient Instructions (Addendum)
F/U in  3 month   HBA1C , cmp and EGFR fasting lipid in 3 month  Please start regular exercise this will help with mild depression, weight and better blood sugar control.  Please get eye exam done  Reconsider the cPAP machine , you will need see a neurologist to re evaluate this   Cholesterol was high in September, please cut back on fried and fatty foods, blood sugar was good.  Reduce caffeine to improve reflux symptoms  Please schedule and keep eye exam

## 2012-05-15 NOTE — Progress Notes (Signed)
Subjective:    Patient ID: Cheryl Abbott, female    DOB: Sep 05, 1945, 66 y.o.   MRN: [id number]  HPI Preventive Screening-Counseling & Management   Patient present here today for a Medicare annual wellness visit.   Current Problems (verified)   Medications Prior to Visit Allergies (verified)   PAST HISTORY  Family History; Four siblings living, no family h/o vascular disease, or cancer. Brother diabetic, one sister mental health illness. Mother died at 71 had dementia, father age 6, emphysema  Social History  Divorced x 77 years, mother of 2 daughters, one has lupus. Retired from [employer], educational in 2002  Risk Factors  Current exercise habits: none plans to start silver sneakers   Dietary issues discussed:increased vegetable and fruit intake reduce carbs and caffeine   Cardiac risk factors: DM, hTN hyperlipidemia, lack of activity  Depression Screen  (Note: if answer to either of the following is "Yes", a more complete depression screening is indicated)   Over the past two weeks, have you felt down, depressed or hopeless? No  Over the past two weeks, have you felt little interest or pleasure in doing things? No  Have you lost interest or pleasure in daily life?yes Do you often feel hopeless? No  Do you cry easily over simple problems? yes   Activities of Daily Living  In your present state of health, do you have any difficulty performing the following activities?  Driving?: No Managing money?: No Feeding yourself?:No Getting from bed to chair?:No Climbing a flight of stairs?:No Preparing food and eating?:No Bathing or showering?:No Getting dressed?:No Getting to the toilet?:No Using the toilet?:No Moving around from place to place?: No  Fall Risk Assessment In the past year have you fallen or had a near fall?:yes, once Are you currently taking any medications that make you dizziness?:No   Hearing Difficulties: No Do you often ask people to speak up or repeat  themselves?:No Do you experience ringing or noises in your ears?:No Do you have difficulty understanding soft or whispered voices?:No  Cognitive Testing  Alert? Yes Normal Appearance?Yes  Oriented to person? Yes Place? Yes  Time? Yes  Displays appropriate judgment?Yes  Can read the correct time from a watch face? yes Are you having problems remembering things?No  Advanced Directives have been discussed with the patient?Yes, needs to further discuss with her children    List the Names of Other Physician/Practitioners you currently use:      Assessment:    Annual Wellness Exam   Plan:    During the course of the visit the patient was educated and counseled about appropriate screening and preventive services including:  A healthy diet is rich in fruit, vegetables and whole grains. Poultry fish, nuts and beans are a healthy choice for protein rather then red meat. A low sodium diet and drinking 64 ounces of water daily is generally recommended. Oils and sweet should be limited. Carbohydrates especially for those who are diabetic or overweight, should be limited to 30-45 gram per meal. It is important to eat on a regular schedule, at least 3 times daily. Snacks should be primarily fruits, vegetables or nuts. It is important that you exercise regularly at least 30 minutes 5 times a week. If you develop chest pain, have severe difficulty breathing, or feel very tired, stop exercising immediately and seek medical attention  Immunization reviewed and updated. Cancer screening reviewed and updated    Patient Instructions (the written plan) was given to the patient.  Medicare Attestation  I have  personally reviewed:  The patient's medical and social history  Their use of alcohol, tobacco or illicit drugs  Their current medications and supplements  The patient's functional ability including ADLs,fall risks, home safety risks, cognitive, and hearing and visual impairment  Diet and  physical activities  Evidence for depression or mood disorders  The patient's weight, height, BMI, and visual acuity have been recorded in the chart. I have made referrals, counseling, and provided education to the patient based on review of the above and I have provided the patient with a written personalized care plan for preventive services.      Review of Systems     Objective:   Physical Exam        Assessment & Plan:

## 2012-05-19 ENCOUNTER — Other Ambulatory Visit: Payer: Self-pay | Admitting: Family Medicine

## 2012-05-20 IMAGING — CT CT ABD-PELV W/O CM
2 of 4 series · 17 of 46 positions shown, 19 images · non-contrast
Comparison: June 25, 2006

CLINICAL DATA: Bilateral renal calculus; recurrent UTI

CT ABDOMEN AND PELVIS WITHOUT CONTRAST
TECHNIQUE: Multidetector CT imaging of the abdomen and pelvis was
performed following the standard protocol without intravenous
contrast.

[Series 4: mpr coronal · coronal · 0.79mm/px · 3 of 108 slices shown]
[im 36/108  soft-tissue]
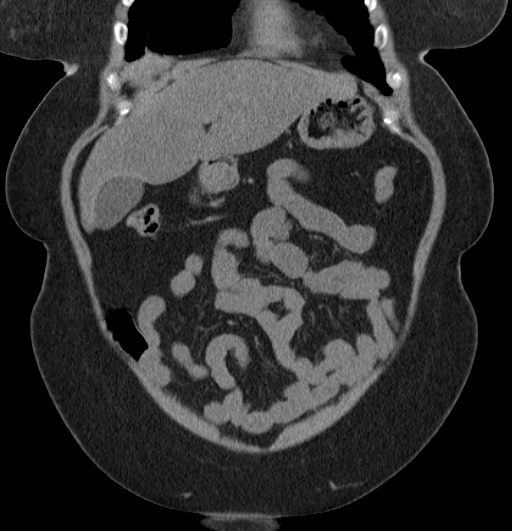
[im 48/108  soft-tissue]
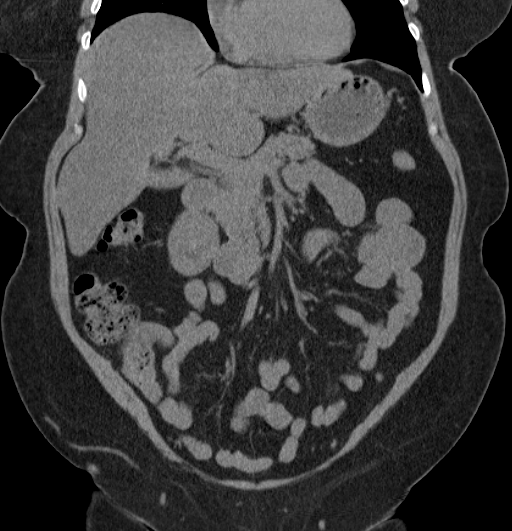
[im 60/108  soft-tissue]
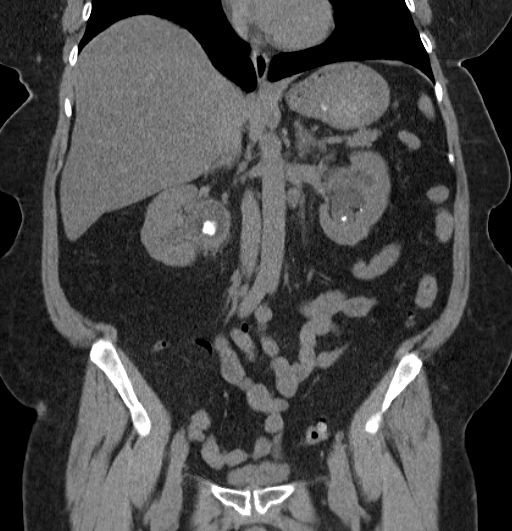

[Series 6: (person_name) thins (id) >45>(id) · axial · 0.81mm/px · z∈[-350,+38]mm · 14 of 214 slices shown, 16 images]
[im 10/214  soft-tissue]
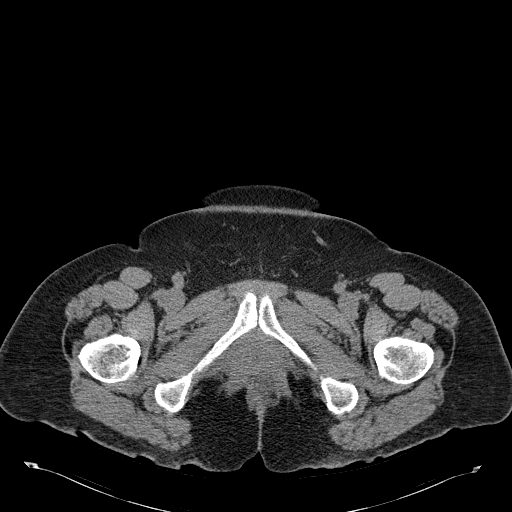
[im 10/214  bone]
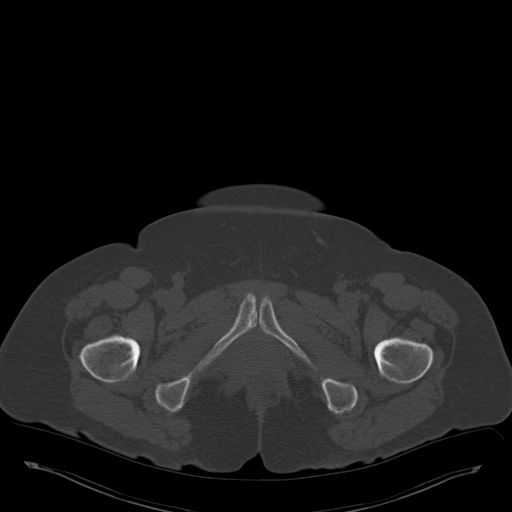
[im 30/214  soft-tissue]
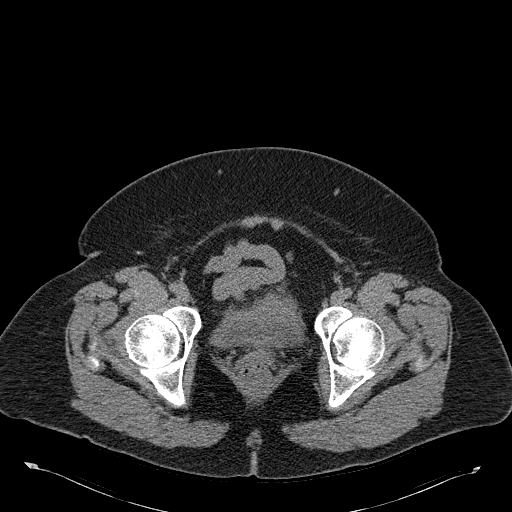
[im 39/214  soft-tissue]
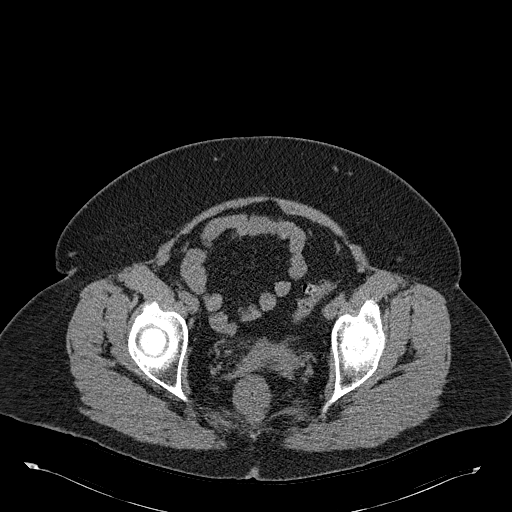
[im 59/214  soft-tissue]
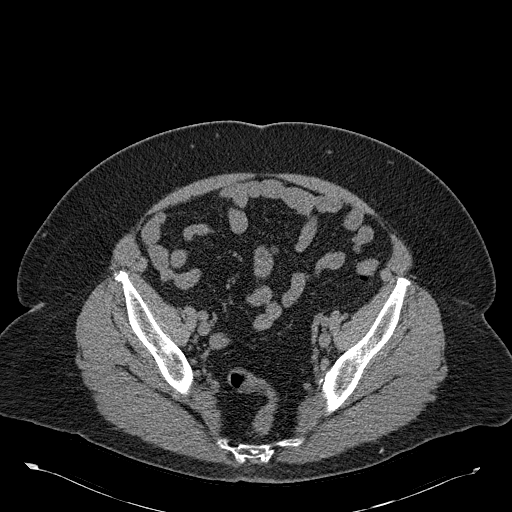
[im 68/214  soft-tissue]
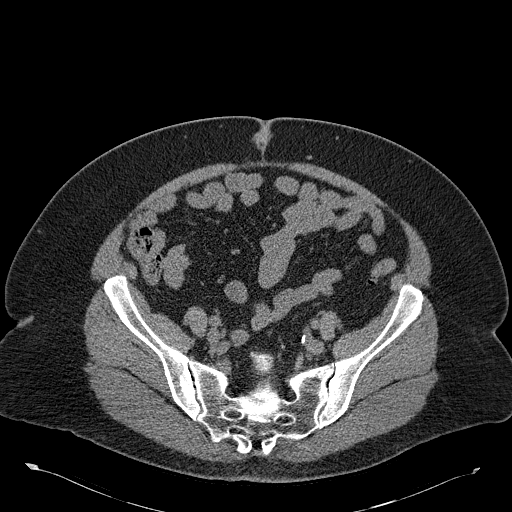
[im 88/214  soft-tissue]
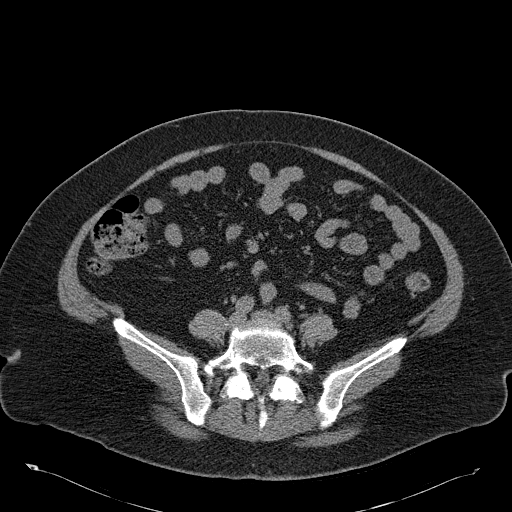
[im 97/214  soft-tissue]
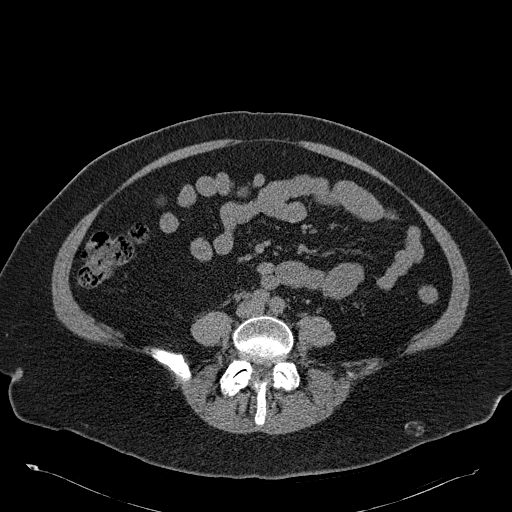
[im 117/214  soft-tissue]
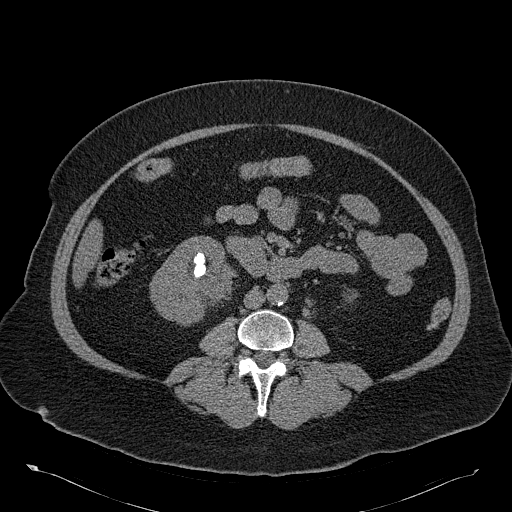
[im 126/214  soft-tissue]
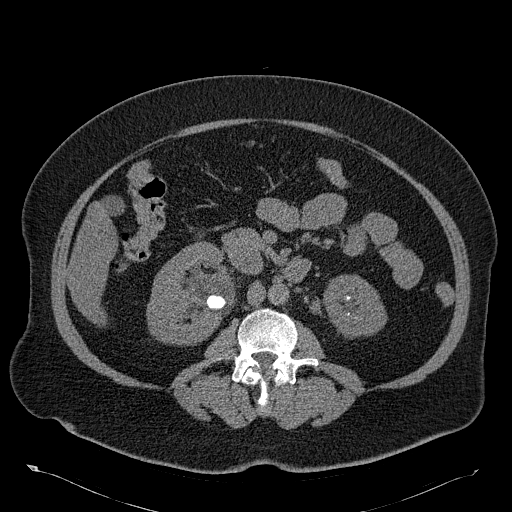
[im 126/214  bone]
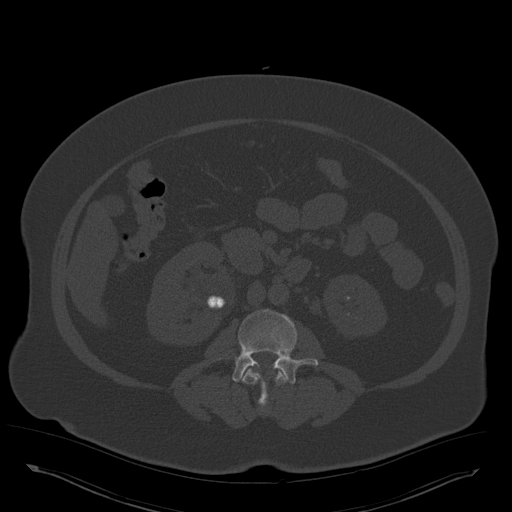
[im 146/214  soft-tissue]
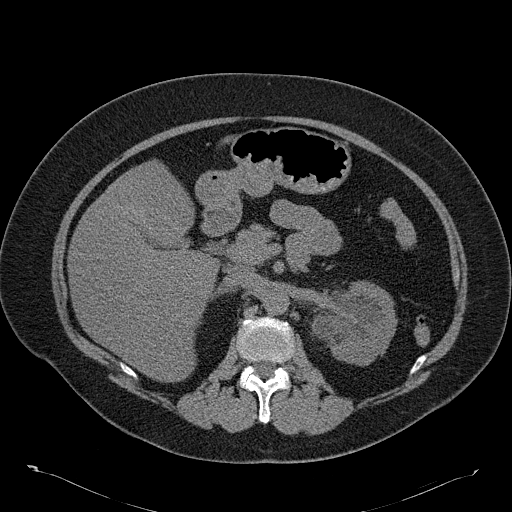
[im 155/214  soft-tissue]
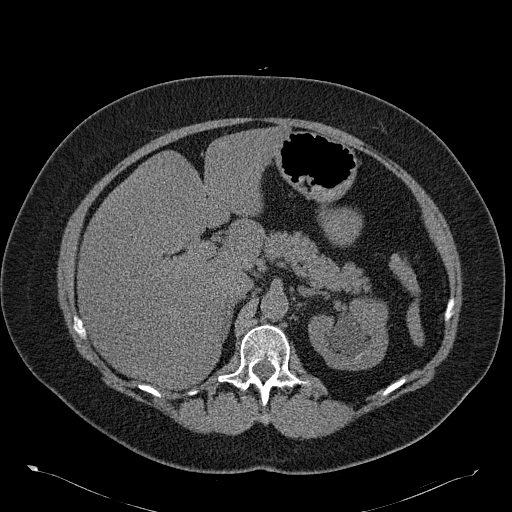
[im 175/214  soft-tissue]
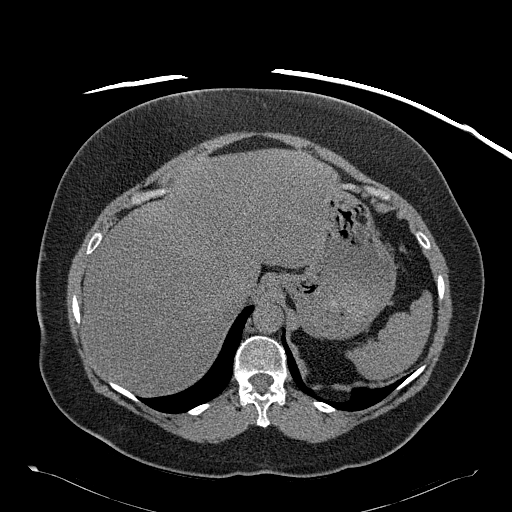
[im 184/214  soft-tissue]
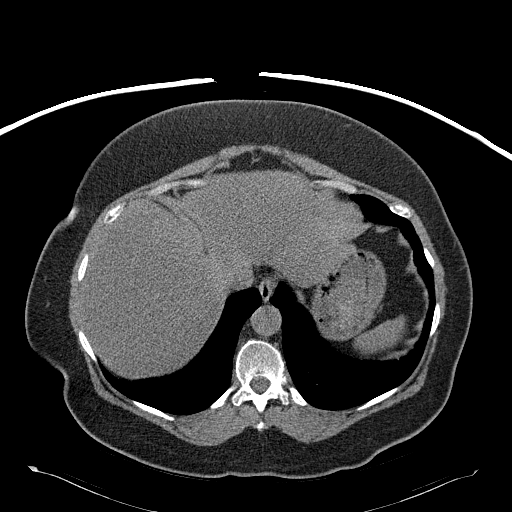
[im 204/214  soft-tissue]
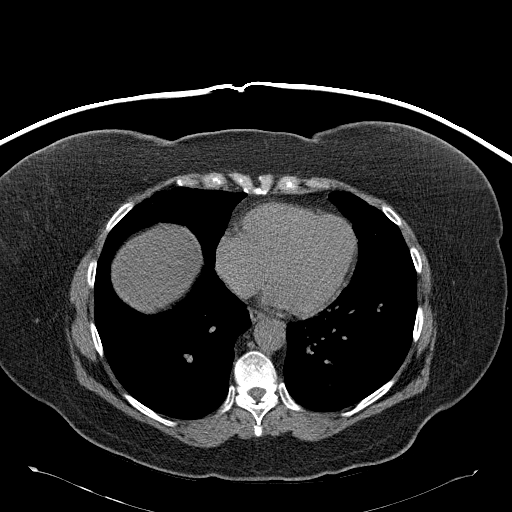

[17 of 46 positions shown; findings below may reference images not displayed]

FINDINGS: The lung bases are clear.  The liver, gallbladder,
spleen, pancreas, and adrenal glands have an unremarkable
noncontrasted appearance.  The bowel is unremarkable with no
evident no evidence of gross obstruction.  The appendix is well
seen within the right lower quadrant.  There is no free fluid or
adenopathy within the abdomen or pelvis. The osseous structures are
unremarkable.  Sigmoid diverticulosis is present without
radiographic evidence of diverticulitis.

There are large renal calculi bilaterally which have markedly
increased in size since the prior CT scan.  There are obstructing
calculi in both renal pelves with moderate hydronephrosis
bilaterally now.  These measure 1.4 cm on the right and 1.6 cm on
the left.  There is also a nonobstructing 2 cm calculus at the
lower pole of the right kidney, as well as additional sub
centimeter calculi bilaterally.  Neither ureter is dilated.  The
urinary bladder has an unremarkable appearance.
IMPRESSION: There are partially obstructing calculi within both renal pelves
with moderate hydronephrosis bilaterally, as described above.

## 2012-05-26 ENCOUNTER — Telehealth: Payer: Self-pay | Admitting: Family Medicine

## 2012-05-26 IMAGING — CR DG ABDOMEN 1V
2 series · 2 of 2 positions shown · non-contrast
Comparison: 01/06/2008

CLINICAL DATA: Renal calculi, recurrent UTIs

ABDOMEN - 1 VIEW

[view not recorded (1 of 2)]
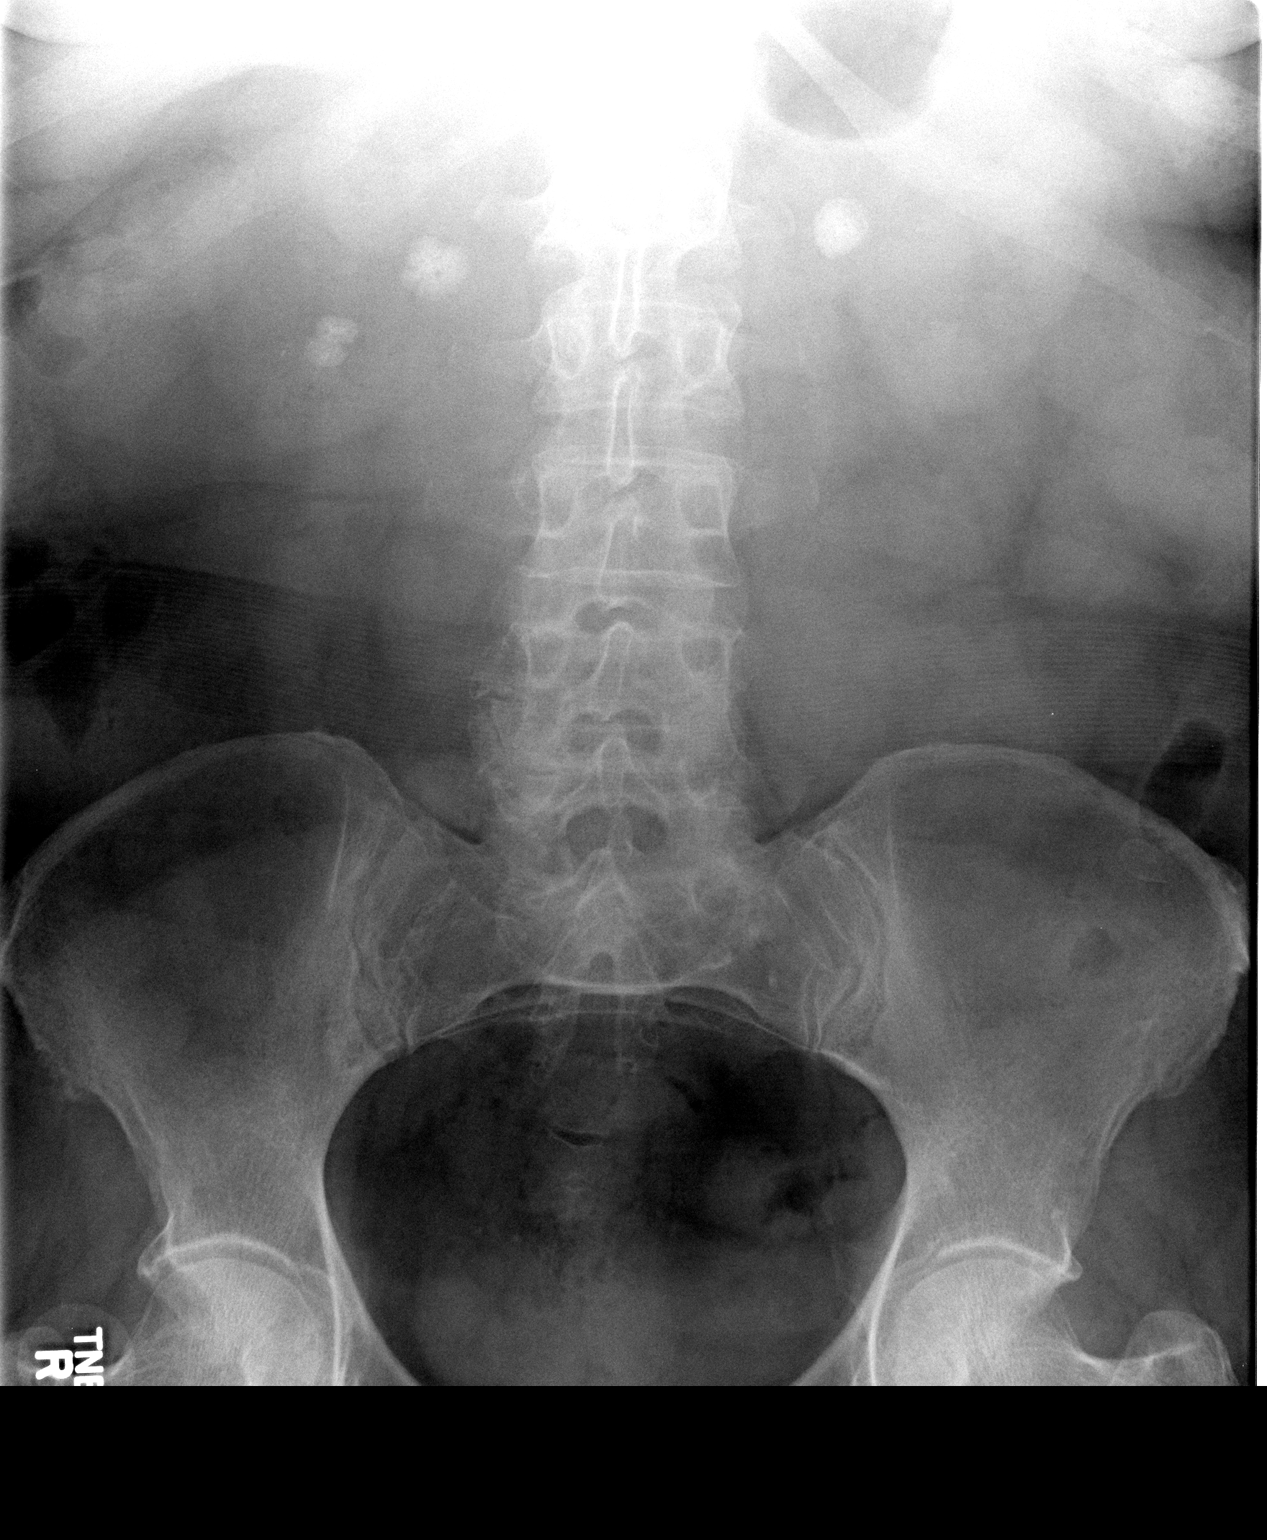

[view not recorded (2 of 2)]
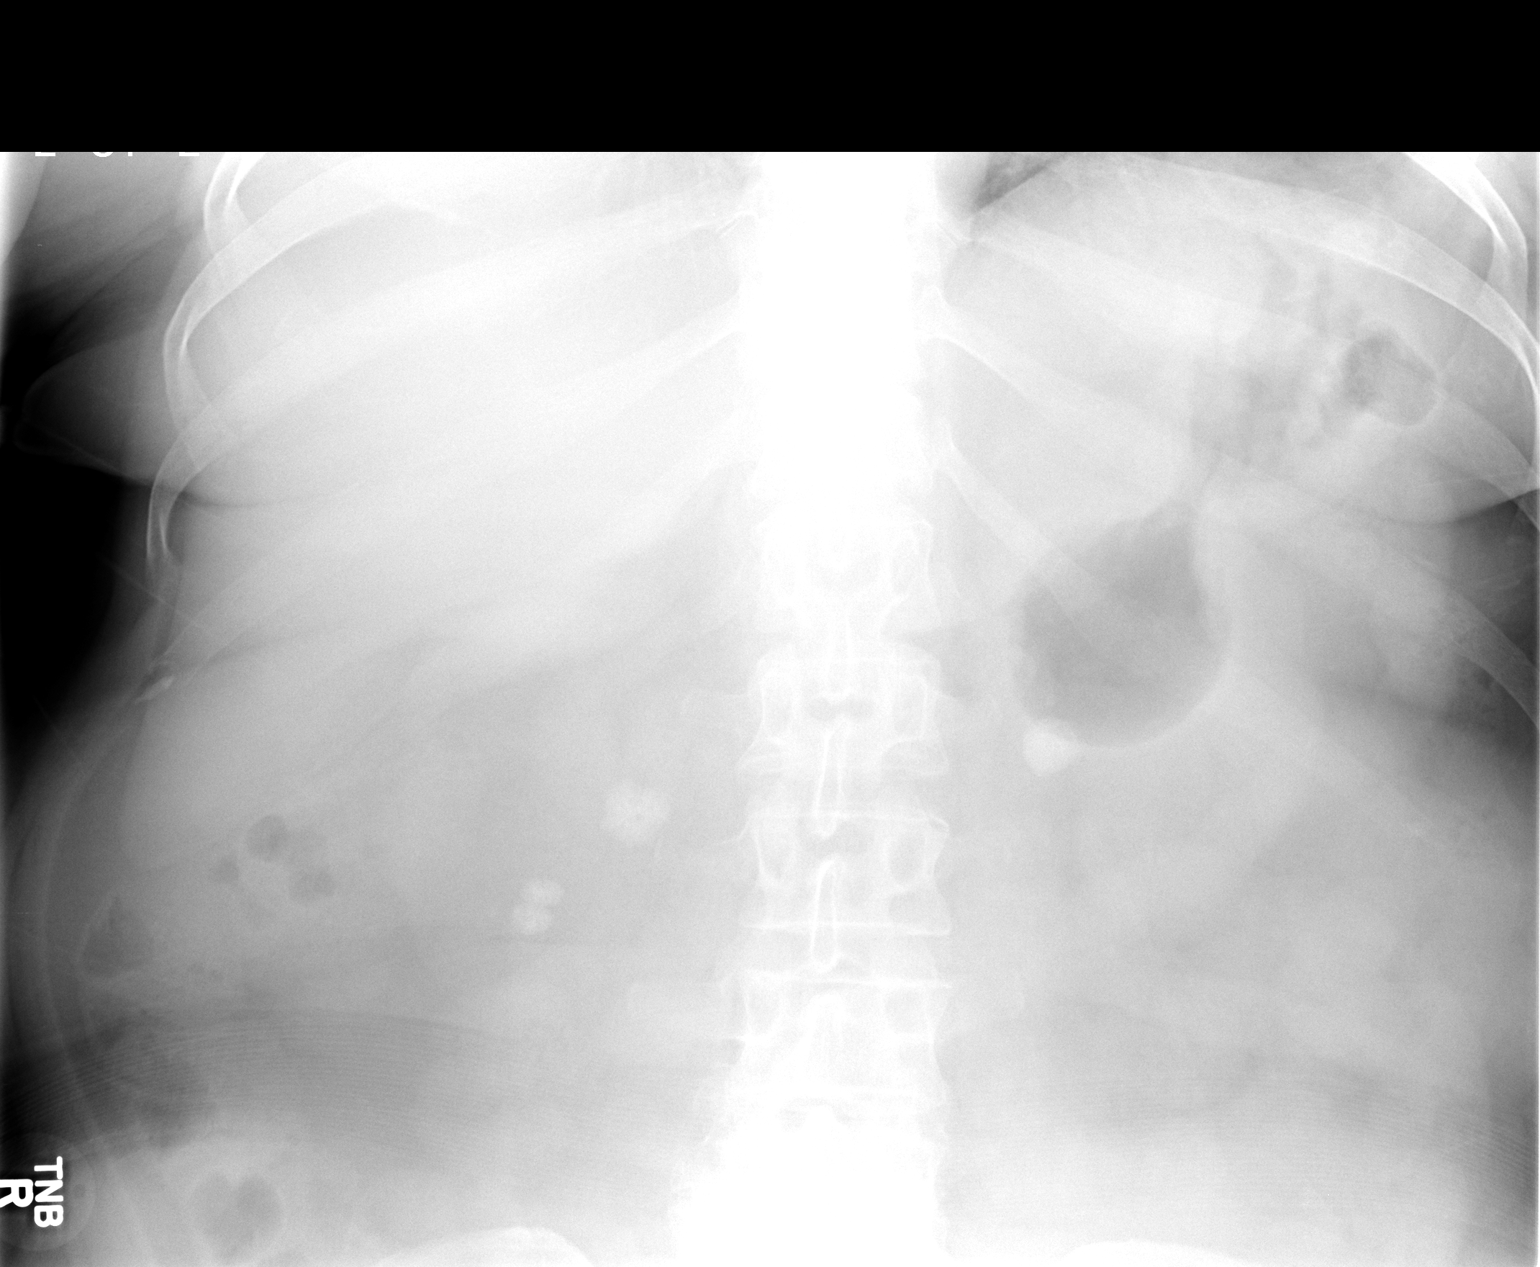

[2 of 2 positions shown; findings below may reference images not displayed]

FINDINGS: Large bilateral renal calculi, measuring up to 1.8 x 1.8 cm at
expected position of right renal pelvis and [DATE] x 1.5 cm at
expected position of left renal pelvis.
These were also seen on an interval CT of 12/09/2009.
No ureteral calcifications identified.
Bowel gas pattern normal.
Bones appear demineralized.
IMPRESSION: Large bilateral renal calculi.

## 2012-05-27 ENCOUNTER — Encounter (HOSPITAL_COMMUNITY): Payer: Self-pay

## 2012-05-27 ENCOUNTER — Emergency Department (HOSPITAL_COMMUNITY)
Admission: EM | Admit: 2012-05-27 | Discharge: 2012-05-27 | Disposition: A | Discharge status: Home / Self Care | Payer: Medicare Other | Attending: Emergency Medicine | Admitting: Emergency Medicine

## 2012-05-27 ENCOUNTER — Other Ambulatory Visit: Payer: Self-pay | Admitting: Family Medicine

## 2012-05-27 ENCOUNTER — Emergency Department (HOSPITAL_COMMUNITY): Payer: Medicare Other

## 2012-05-27 DIAGNOSIS — Z79899 Other long term (current) drug therapy: Secondary | ICD-10-CM | POA: Insufficient documentation

## 2012-05-27 DIAGNOSIS — E785 Hyperlipidemia, unspecified: Secondary | ICD-10-CM | POA: Insufficient documentation

## 2012-05-27 DIAGNOSIS — F411 Generalized anxiety disorder: Secondary | ICD-10-CM | POA: Insufficient documentation

## 2012-05-27 DIAGNOSIS — S300XXA Contusion of lower back and pelvis, initial encounter: Secondary | ICD-10-CM | POA: Insufficient documentation

## 2012-05-27 DIAGNOSIS — R42 Dizziness and giddiness: Secondary | ICD-10-CM | POA: Insufficient documentation

## 2012-05-27 DIAGNOSIS — F329 Major depressive disorder, single episode, unspecified: Secondary | ICD-10-CM | POA: Insufficient documentation

## 2012-05-27 DIAGNOSIS — R296 Repeated falls: Secondary | ICD-10-CM | POA: Insufficient documentation

## 2012-05-27 DIAGNOSIS — Z87891 Personal history of nicotine dependence: Secondary | ICD-10-CM | POA: Insufficient documentation

## 2012-05-27 DIAGNOSIS — Z87448 Personal history of other diseases of urinary system: Secondary | ICD-10-CM | POA: Insufficient documentation

## 2012-05-27 DIAGNOSIS — Z8669 Personal history of other diseases of the nervous system and sense organs: Secondary | ICD-10-CM | POA: Insufficient documentation

## 2012-05-27 DIAGNOSIS — E109 Type 1 diabetes mellitus without complications: Secondary | ICD-10-CM | POA: Insufficient documentation

## 2012-05-27 DIAGNOSIS — F3289 Other specified depressive episodes: Secondary | ICD-10-CM | POA: Insufficient documentation

## 2012-05-27 DIAGNOSIS — Y9289 Other specified places as the place of occurrence of the external cause: Secondary | ICD-10-CM | POA: Insufficient documentation

## 2012-05-27 DIAGNOSIS — I2510 Atherosclerotic heart disease of native coronary artery without angina pectoris: Secondary | ICD-10-CM | POA: Insufficient documentation

## 2012-05-27 DIAGNOSIS — Z8679 Personal history of other diseases of the circulatory system: Secondary | ICD-10-CM | POA: Insufficient documentation

## 2012-05-27 DIAGNOSIS — I10 Essential (primary) hypertension: Secondary | ICD-10-CM | POA: Insufficient documentation

## 2012-05-27 DIAGNOSIS — Y9389 Activity, other specified: Secondary | ICD-10-CM | POA: Insufficient documentation

## 2012-05-27 DIAGNOSIS — Z87442 Personal history of urinary calculi: Secondary | ICD-10-CM | POA: Insufficient documentation

## 2012-05-27 MED ORDER — HYDROCODONE-ACETAMINOPHEN 5-325 MG PO TABS
1.0000 | ORAL_TABLET | Freq: Once | ORAL | Status: AC
  Administered 2012-05-27: 1 via ORAL
  Filled 2012-05-27: qty 1

## 2012-05-27 MED ORDER — HYDROCODONE-ACETAMINOPHEN 5-325 MG PO TABS: 1.0000 | ORAL_TABLET | ORAL | Status: AC | PRN

## 2012-05-27 NOTE — ED Notes (Signed)
Pt states she was bending over and got dizzy and fell

## 2012-05-27 NOTE — Telephone Encounter (Signed)
Called and left message that she needed to come in for evaluation and to call back and make appt

## 2012-05-27 NOTE — ED Provider Notes (Signed)
History     CSN: [id number]  Arrival date & time 05/27/12  1730   First MD Initiated Contact with Patient 05/27/12 1759      Chief Complaint  Patient presents with  . Fall    (Consider location/radiation/quality/duration/timing/severity/associated sxs/prior treatment) Patient is a 66 y.o. female presenting with fall. The history is provided by the patient. No language interpreter was used.  Fall The accident occurred 2 days ago. Incident: Patient was putting Christmas cards around the base of the Christmas tree. She stood up, felt dizzy, and fell backwards, landing on her lower back and sacral region. She's had pain there since that time.  She landed on carpet. Point of impact: Pelvis and sacral region. Pain location: Sacral region. The pain is at a severity of 8/10. The pain is severe. She was ambulatory at the scene. There was no entrapment after the fall. There was no drug use involved in the accident. There was no alcohol use involved in the accident. Pertinent negatives include no fever. Associated symptoms comments: Nothing.. The symptoms are aggravated by activity. Treatments tried: She took tramadol and acetaminophen without relief.    Past Medical History  Diagnosis Date  . Anxiety disorder   . Back pain   . Diabetes mellitus type 1   . Hyperlipidemia   . Hypertension   . Sleep apnea   . Disease of vein   . Coronary artery disease   . Complication of anesthesia     pt states she was not given enough medication with  tcs with Dr Tamala Julian                                    last Benedict.as able to feel and hear everything.  . Depression   . DEMENTIA   . Renal insufficiency   . Kidney stones     Past Surgical History  Procedure Date  . Partial hysterectomy   . Lithotryspy and stent replacement rt ureter 10/2004    12/2009 Dr, Michela Pitcher   . Removal of lump from posterior r thigh   . Colonoscopy 08/21/06    adenomatous polyps/  . Cardiac catheterization 2005  . Flexible  sigmoidoscopy 09/11/2011    Procedure: FLEXIBLE SIGMOIDOSCOPY;  Surgeon: Danie Binder, MD;  Location: [facility];  Service: Endoscopy;  Laterality: N/A;  with propofol  . Extraction of left kidney stone   . Bilateral stone removal with nephrostomy at [facility]   . Abdominal hysterectomy     Family History  Problem Relation Age of Onset  . Hypertension Mother   . Diabetes Mother   . Heart failure Mother   . Dementia Mother   . Emphysema Father   . Hypertension Father   . Diabetes Brother   . GER disease Brother   . Cancer      family history   . Diabetes      family history   . Heart defect      famiily history   . Arthritis      family history   . Anesthesia problems Neg Hx   . Hypotension Neg Hx   . Malignant hyperthermia Neg Hx   . Pseudochol deficiency Neg Hx     History  Substance Use Topics  . Smoking status: Former Smoker    Quit date: 09/21/1994  . Smokeless tobacco: Former Systems developer    Quit date: 09/04/1996  . Alcohol Use: No  OB History    Grav Para Term Preterm Abortions TAB SAB Ect Mult Living                  Review of Systems  Constitutional: Negative.  Negative for fever and chills.  HENT: Negative.   Eyes: Negative.   Respiratory: Negative.   Cardiovascular: Negative.   Gastrointestinal: Negative.   Genitourinary: Negative.   Musculoskeletal: Positive for back pain.  Skin: Negative.   Neurological: Negative.   Psychiatric/Behavioral: Negative.     Allergies  Ace inhibitors; Keflex; Nitrofurantoin; and Penicillins  Home Medications   Current Outpatient Rx  Name  Route  Sig  Dispense  Refill  . ALPRAZOLAM 0.5 MG PO TABS   Oral   Take 0.5 mg by mouth 2 (two) times daily.         Marland Kitchen AMLODIPINE BESYLATE 5 MG PO TABS   Oral   Take 5 mg by mouth daily.         Marland Kitchen CLOTRIMAZOLE-BETAMETHASONE 1-0.05 % EX CREA   Topical   Apply 1 application topically 2 (two) times daily. To feet         . DIPHENHYDRAMINE-APAP (SLEEP) 25-500 MG PO  TABS   Oral   Take 2 tablets by mouth at bedtime.         . IBUPROFEN 800 MG PO TABS   Oral   Take 800 mg by mouth daily. For pain         . LOVASTATIN 40 MG PO TABS      Take two tablets at bedtime. Dose increase effective 02/14/2012   60 tablet   11   . METFORMIN HCL 500 MG PO TABS   Oral   Take 2 tablets (1,000 mg total) by mouth 2 (two) times daily with a meal.   120 tablet   4   . OMEPRAZOLE 20 MG PO CPDR   Oral   Take 1 capsule (20 mg total) by mouth daily.   30 capsule   3   . PAROXETINE HCL 40 MG PO TABS   Oral   Take 40 mg by mouth daily.         . TRAMADOL HCL 50 MG PO TABS   Oral   Take 1 tablet (50 mg total) by mouth 2 (two) times daily.   60 tablet   4     BP 138/91  Pulse 100  Temp 97.2 F (36.2 C) (Oral)  Resp 20  Ht 5' 6.5" (1.689 m)  Wt 204 lb (92.534 kg)  BMI 32.43 kg/m2  SpO2 97%  Physical Exam  Nursing note and vitals reviewed. Constitutional: She is oriented to person, place, and time. She appears well-developed and well-nourished.       Pleasant elderly lady in mild to moderate distress with pain in the sacral region.  HENT:  Head: Normocephalic and atraumatic.  Right Ear: External ear normal.  Left Ear: External ear normal.  Mouth/Throat: Oropharynx is clear and moist.  Eyes: Conjunctivae normal and EOM are normal. Pupils are equal, round, and reactive to light.  Neck: Normal range of motion. Neck supple.  Cardiovascular: Normal rate, regular rhythm and normal heart sounds.   Pulmonary/Chest: Effort normal and breath sounds normal.  Abdominal: Soft. Bowel sounds are normal.  Musculoskeletal:       Pain is localized to the lower sacral region. There is no Mark on the skin, no palpable bony deformity.  Neurological: She is alert and oriented to person, place, and  time.       No sensory or motor deficit.  Skin: Skin is warm and dry.  Psychiatric: She has a normal mood and affect. Her behavior is normal.    ED Course   Procedures (including critical care time)  6:34 PM Pt seen --> physical exam performed.  X-rays of pelvis and sacrum ordered.  PO hydrocodone-acetaminophen ordered.  8:03 PM Results for orders placed in visit on 02/07/12  HEMOGLOBIN A1C      Component Value Range   Hemoglobin A1C 6.8 (*) <5.7 %   Mean Plasma Glucose 148 (*) <117 mg/dL  LIPID PANEL      Component Value Range   Cholesterol 206 (*) 0 - 200 mg/dL   Triglycerides 203 (*) <150 mg/dL   HDL 36 (*) >39 mg/dL   Total CHOL/HDL Ratio 5.7     VLDL 41 (*) 0 - 40 mg/dL   LDL Cholesterol 129 (*) 0 - 99 mg/dL  COMPLETE METABOLIC PANEL WITH GFR      Component Value Range   Sodium 139  135 - 145 mEq/L   Potassium 4.3  3.5 - 5.3 mEq/L   Chloride 104  96 - 112 mEq/L   CO2 25  19 - 32 mEq/L   Glucose, Bld 90  70 - 99 mg/dL   BUN 12  6 - 23 mg/dL   Creat 0.92  0.50 - 1.10 mg/dL   Total Bilirubin 0.2 (*) 0.3 - 1.2 mg/dL   Alkaline Phosphatase 86  39 - 117 U/L   AST 16  0 - 37 U/L   ALT 16  0 - 35 U/L   Total Protein 8.0  6.0 - 8.3 g/dL   Albumin 4.3  3.5 - 5.2 g/dL   Calcium 10.0  8.4 - 10.5 mg/dL   GFR, Est African American 75     GFR, Est Non African American 65    TSH      Component Value Range   TSH 0.997  0.350 - 4.500 uIU/mL   Dg Pelvis 1-2 Views  05/27/2012  *RADIOLOGY REPORT*  Clinical Data: Fall.  Pelvic pain.  PELVIS - 1-2 VIEW  Comparison:  03/11/2012  Findings:  There is no evidence of pelvic fracture or diastasis. No other pelvic bone lesions are seen.  IMPRESSION: Negative.   Original Report Authenticated By: Earle Gell, M.D.    Dg Sacrum/coccyx  05/27/2012  *RADIOLOGY REPORT*  Clinical Data: Fall.  Sacrococcygeal pain.  SACRUM AND COCCYX - 2+ VIEW  Comparison:  None.  Findings:  There is no evidence of fracture or other focal bone lesions.  IMPRESSION: Negative.   Original Report Authenticated By: Earle Gell, M.D.     X-rays were negative.  Rx hydrocodone-acetaminophen po q4h prn pain.  1. Contusion of  sacrum         Mylinda Latina III, MD 05/28/12 1115

## 2012-05-27 NOTE — ED Notes (Signed)
Pt attempting to call ride at this time.  

## 2012-06-01 ENCOUNTER — Encounter: Payer: Self-pay | Admitting: Family Medicine

## 2012-06-01 DIAGNOSIS — Z0000 Encounter for general adult medical examination without abnormal findings: Secondary | ICD-10-CM | POA: Insufficient documentation

## 2012-06-01 NOTE — Assessment & Plan Note (Signed)
Annual wellness exam completed and documented. Pt is not limited in ability to live independently, she is a low fall risk, and though depressed, does not feel the need for therapy nor medication adjustment, she has chronic depression. Healthy diet and need to commit to daily exercise id discussed and she will work on [employer]

## 2012-06-13 ENCOUNTER — Other Ambulatory Visit: Payer: Self-pay | Admitting: Family Medicine

## 2012-06-13 ENCOUNTER — Telehealth: Payer: Self-pay | Admitting: Family Medicine

## 2012-06-13 ENCOUNTER — Other Ambulatory Visit: Payer: Self-pay

## 2012-06-13 MED ORDER — OMEPRAZOLE 20 MG PO CPDR: 20.0000 mg | DELAYED_RELEASE_CAPSULE | Freq: Every day | ORAL | Status: DC

## 2012-06-13 MED ORDER — ALPRAZOLAM 0.5 MG PO TABS: 0.5000 mg | ORAL_TABLET | Freq: Two times a day (BID) | ORAL | Status: DC

## 2012-06-13 NOTE — Telephone Encounter (Signed)
meds refilled 

## 2012-06-17 IMAGING — CR DG CHEST 2V
2 series · 2 of 2 positions shown · non-contrast
Comparison: Chest x-ray of 01/26/2008

CLINICAL DATA: Fever, weakness, abdominal pain

CHEST - 2 VIEW

[view not recorded (1 of 2)]
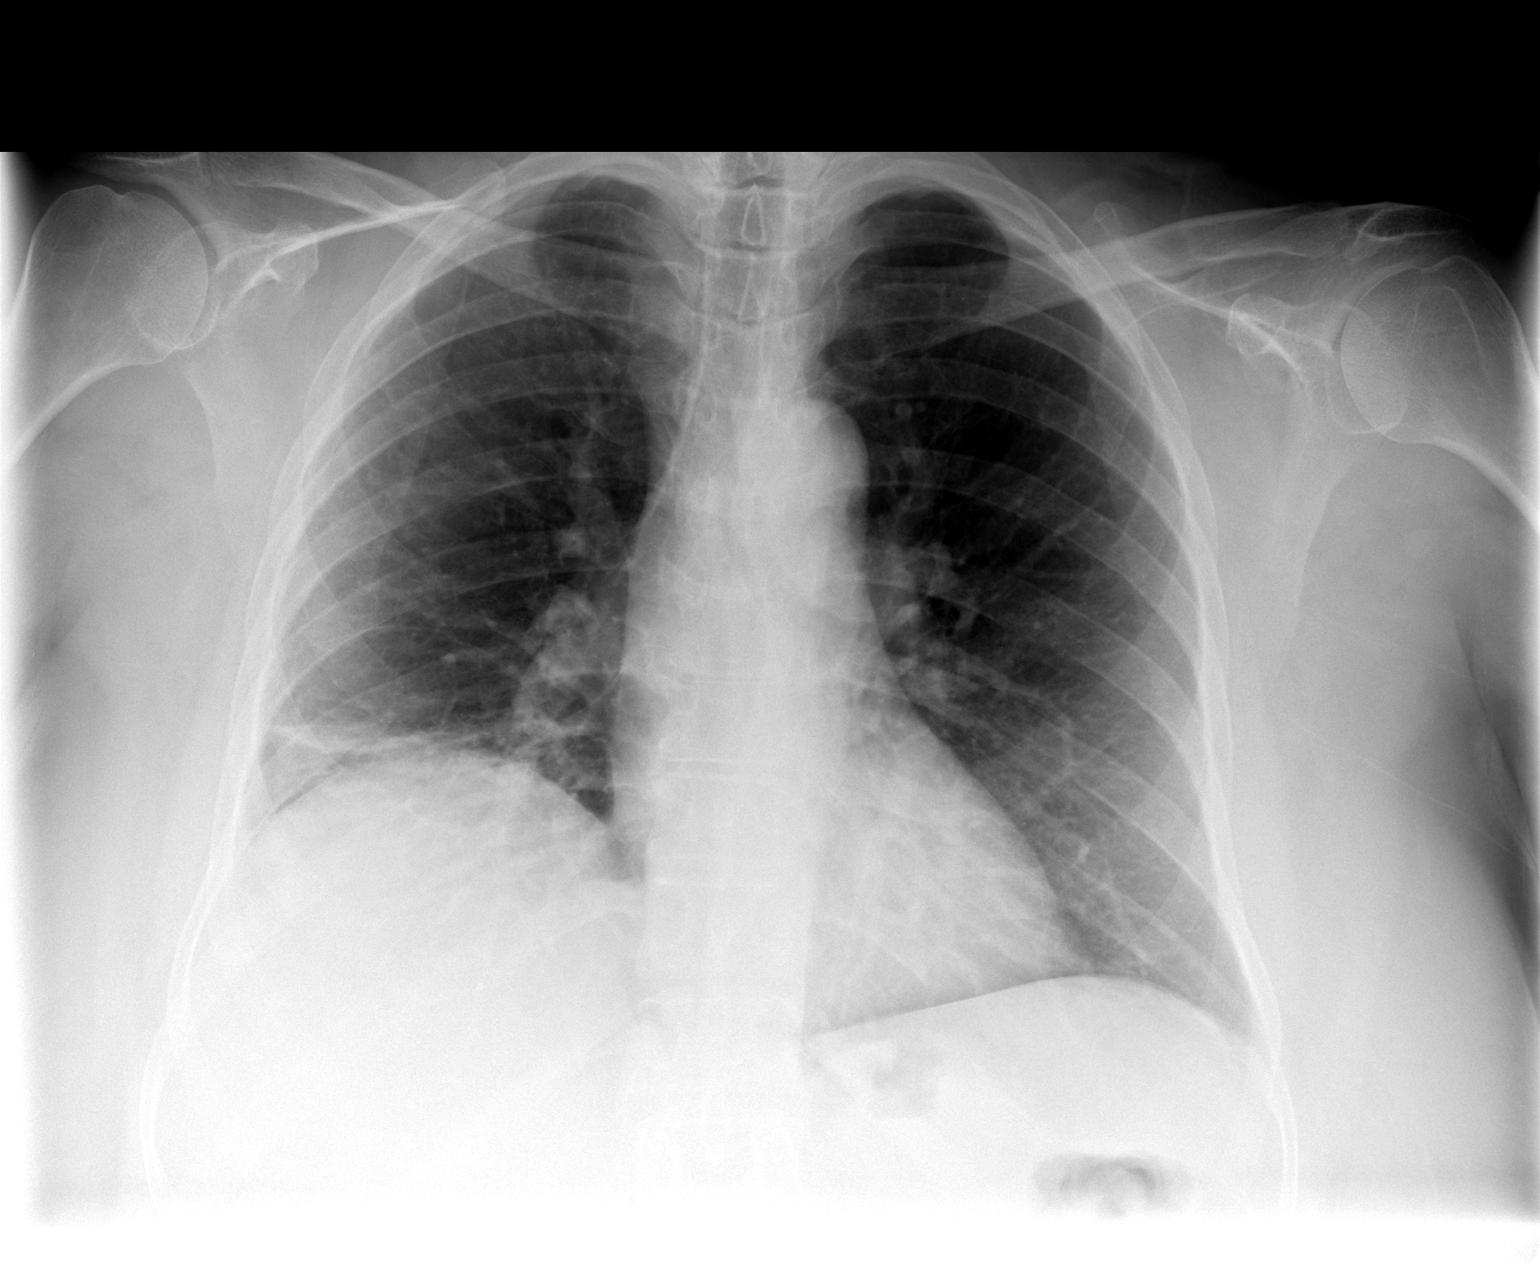

[view not recorded (2 of 2)]
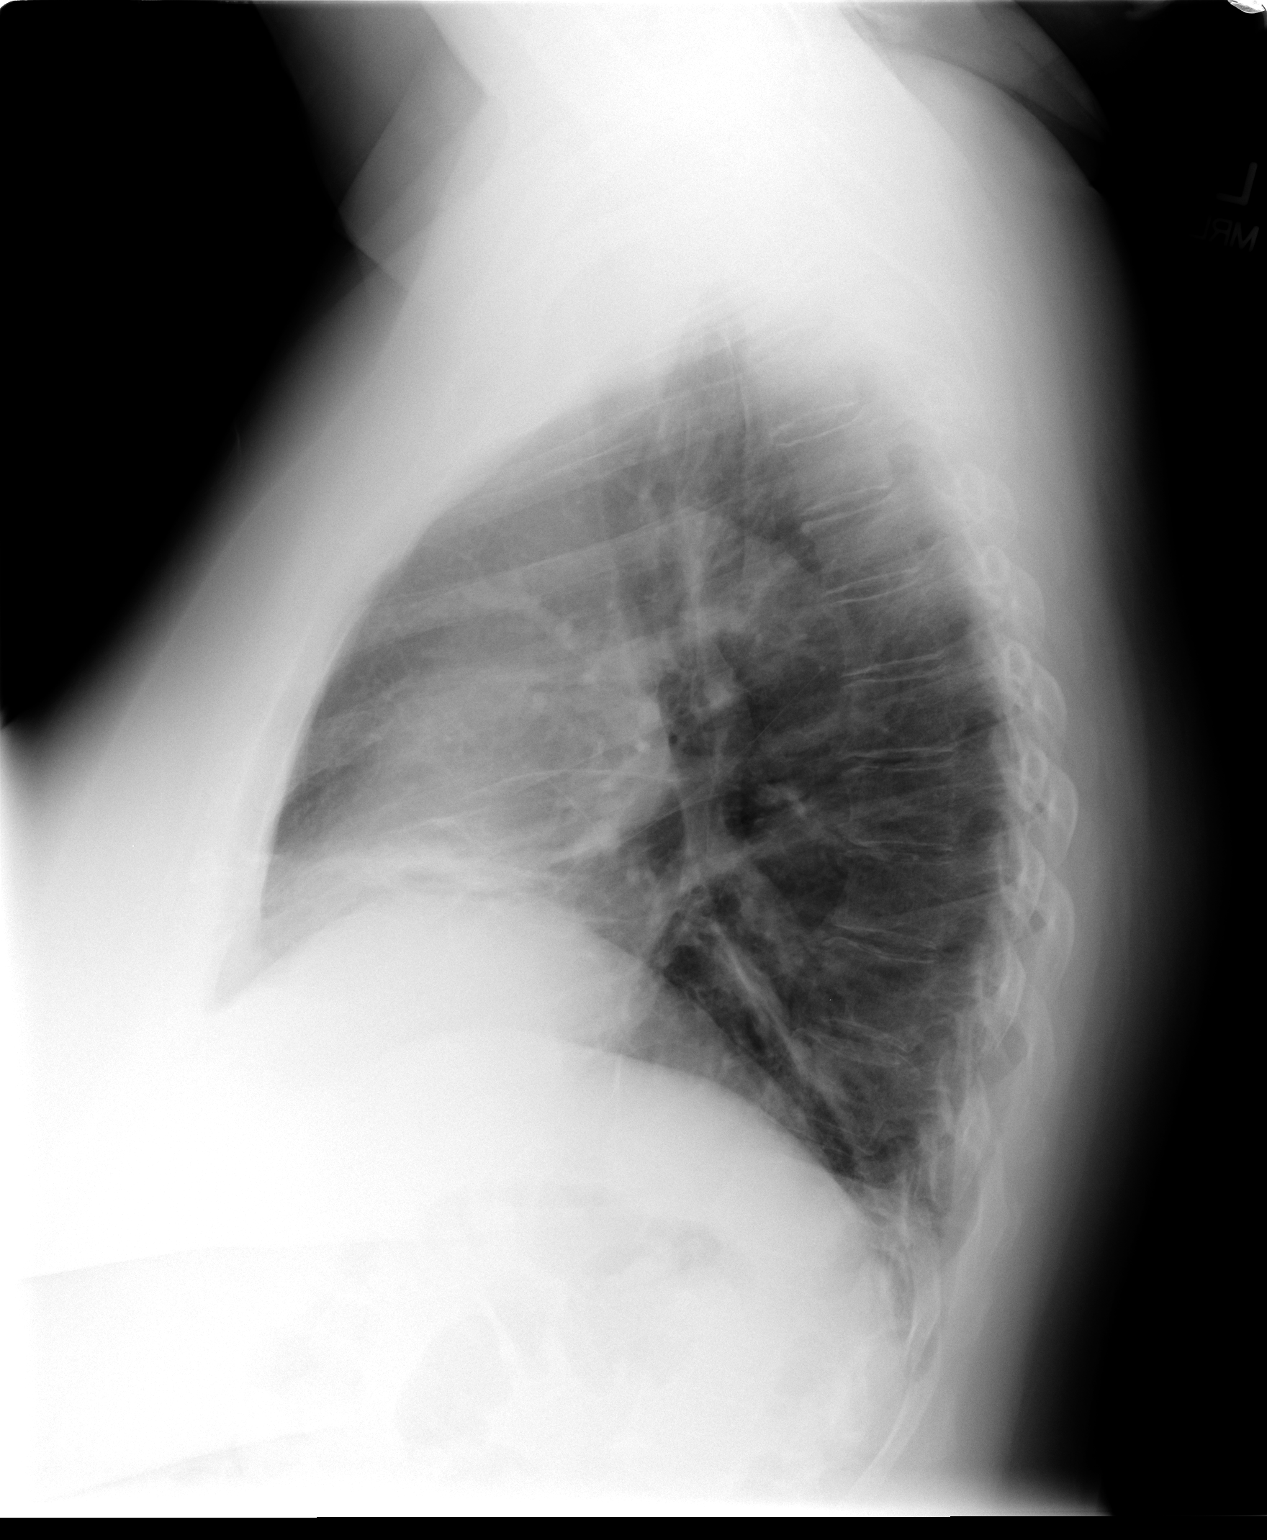

[2 of 2 positions shown; findings below may reference images not displayed]

FINDINGS: Linear opacity is noted in the right middle lobe and
right lower lobe most likely due to atelectasis.  Early pneumonia
is difficult to exclude.  The left lung is clear.  No pleural
effusion is seen.  Mediastinal contours are normal.  The heart is
within normal limits in size.
IMPRESSION: Linear opacity in the right middle lobe and right lower lobe most
consistent with atelectasis.  Recommend follow-up to exclude
pneumonia.

## 2012-06-17 IMAGING — CR DG ABDOMEN 1V
2 series · 2 of 2 positions shown · non-contrast
Comparison: Abdomen film of 01/04/2010

CLINICAL DATA: Fever, weakness, abdominal pain

ABDOMEN - 1 VIEW

[view not recorded (1 of 2)]
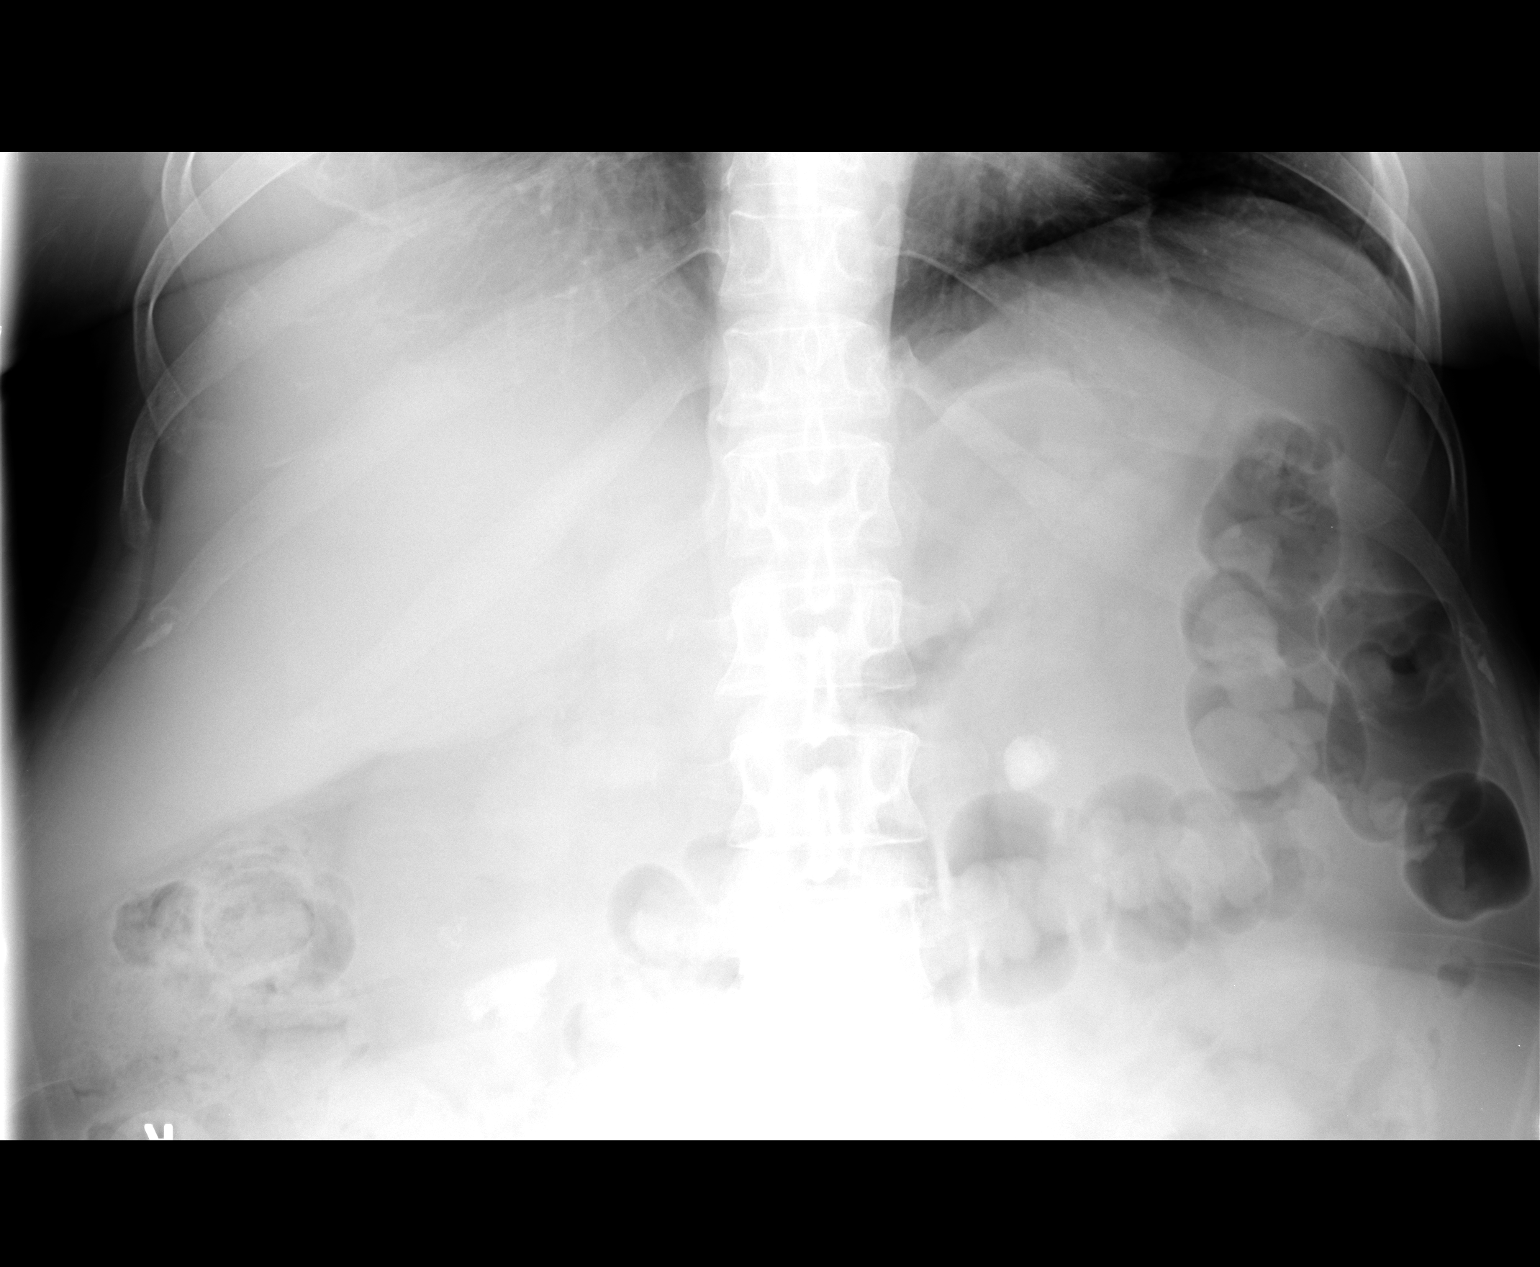

[view not recorded (2 of 2)]
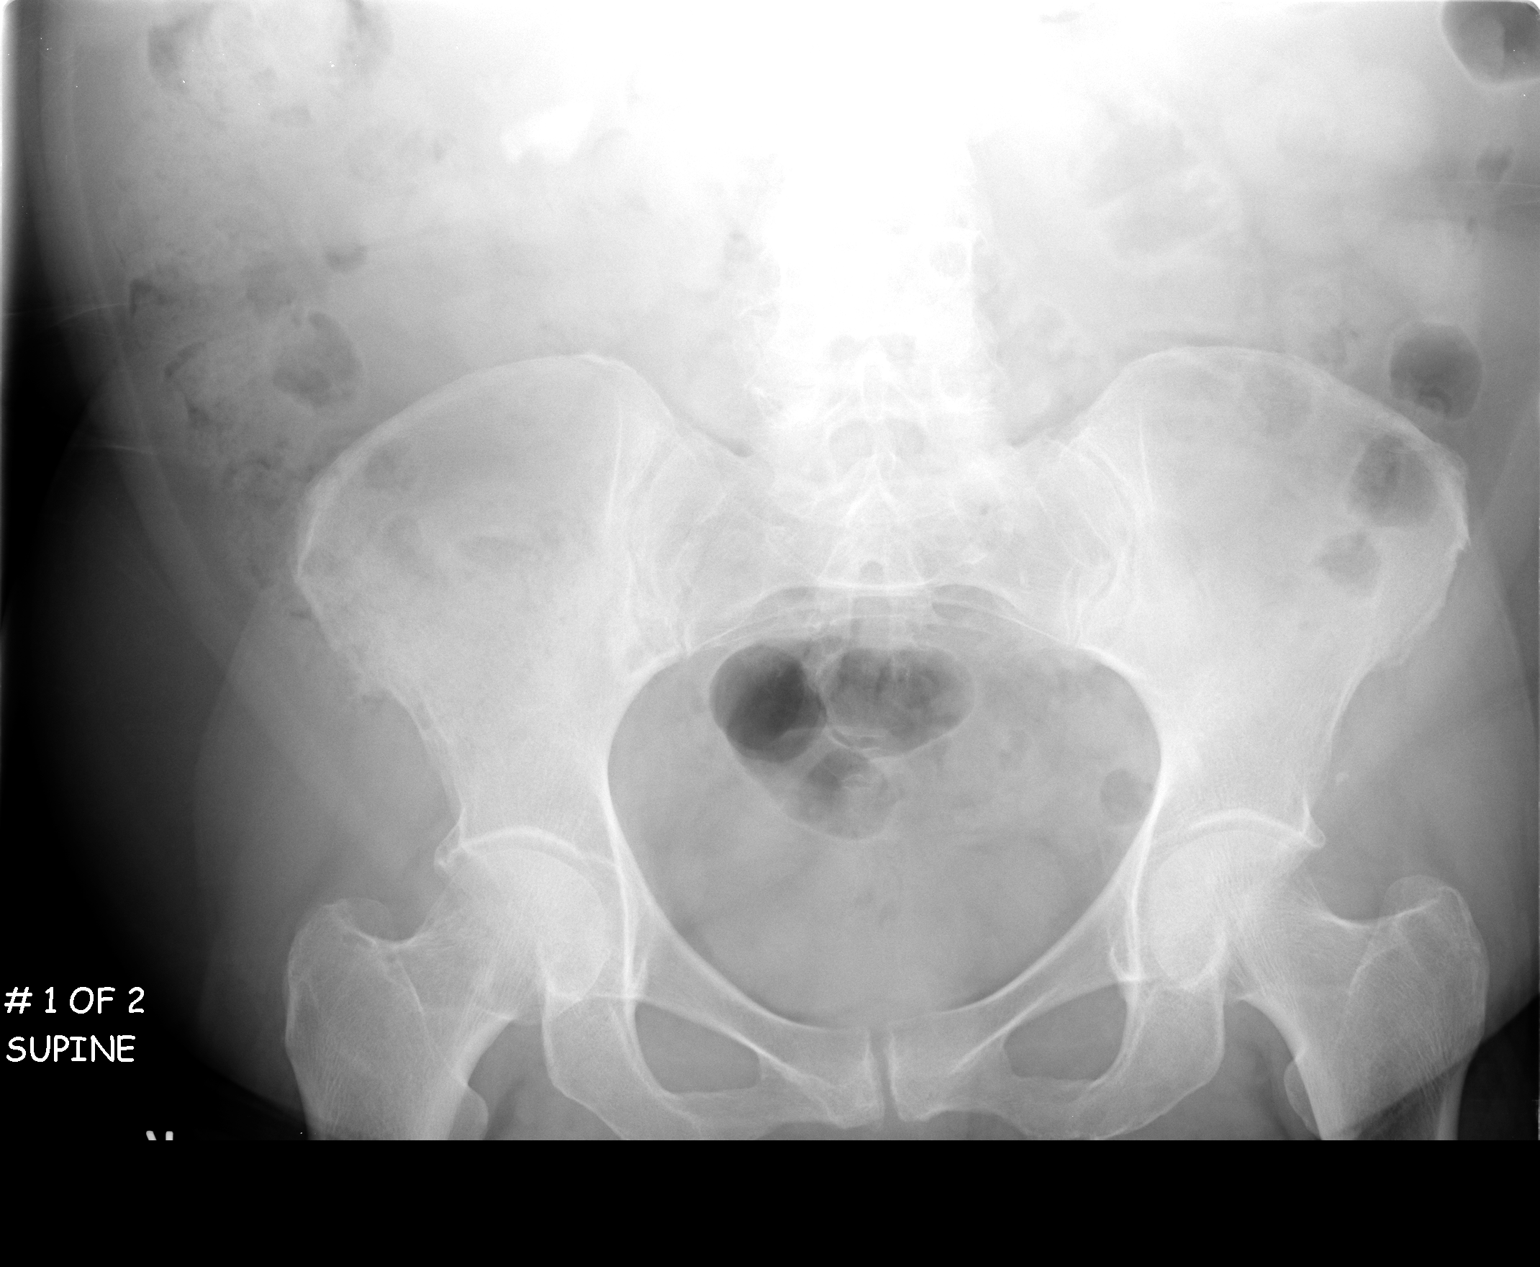

[2 of 2 positions shown; findings below may reference images not displayed]

FINDINGS: Bilateral large renal calculi again noted.  A calculus
previously overlay the right renal pelvis and now most likely is
within the lower pole collecting system on the right.  No definite
calculi are seen along the expected courses of the ureters.  The
bowel gas pattern is nonspecific.
IMPRESSION: Bilateral renal calculi as noted above.  No definite ureteral
calculus is seen.  Nonspecific bowel gas pattern.

## 2012-06-18 ENCOUNTER — Telehealth: Payer: Self-pay

## 2012-06-18 NOTE — Telephone Encounter (Signed)
Walked in office at 4:40 wanting to be seen. States she fell 12/17 and hit her sacrum. Went to the ED and was treated but she states after a while her hair started to hurt and now she can feel a knot in the back of her head and its causing her head to hurt. Wants to know if you want to see her in the office or order a scan of her head to see if anything is wrong.  Call back number [phone]

## 2012-06-18 NOTE — Telephone Encounter (Signed)
Called and left message for patient relaying message.

## 2012-06-18 NOTE — Telephone Encounter (Signed)
Since already evalaute in the Ed, I suggest you apply compress to where she is experiencing some swelling, which m,ay well occur post trauma, can get appt next week if still a prob

## 2012-06-19 IMAGING — CT CT ABD-PELV W/O CM
2 of 5 series · 17 of 46 positions shown, 19 images · non-contrast
Comparison: 12/09/2009

CLINICAL DATA: Right-sided pain, stones.

CT ABDOMEN AND PELVIS WITHOUT CONTRAST
TECHNIQUE: Multidetector CT imaging of the abdomen and pelvis was
performed following the standard protocol without intravenous
contrast.

[Series 2: standard/full over (age)lbs 5.0 · axial · 0.79mm/px · z∈[-449,-54]mm · 14 of 89 slices shown, 16 images]
[im 5/89  soft-tissue]
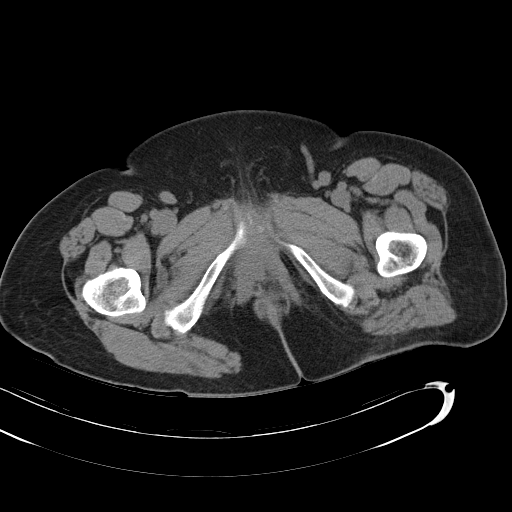
[im 5/89  bone]
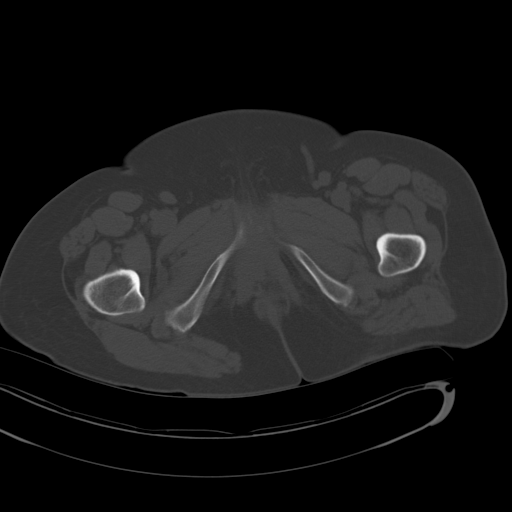
[im 13/89  soft-tissue]
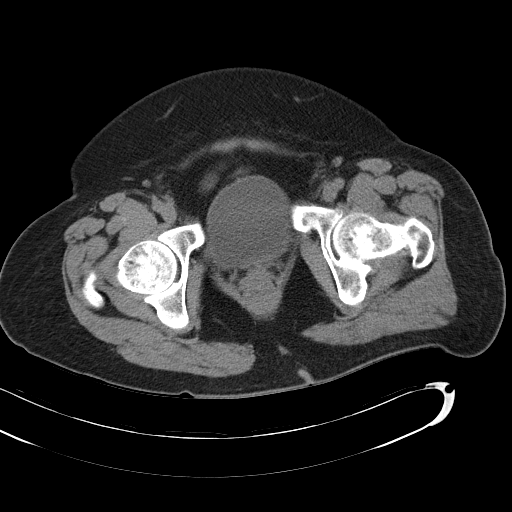
[im 17/89  soft-tissue]
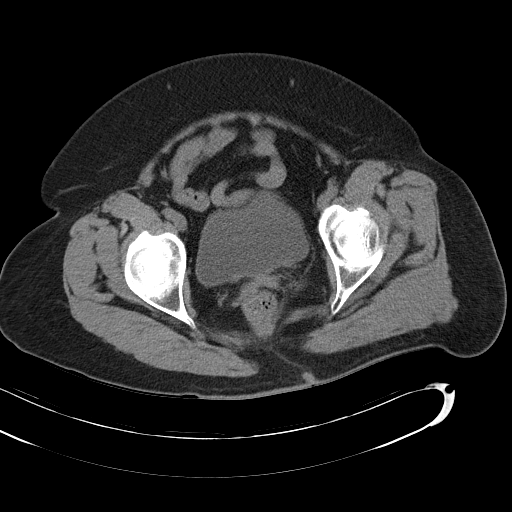
[im 26/89  soft-tissue]
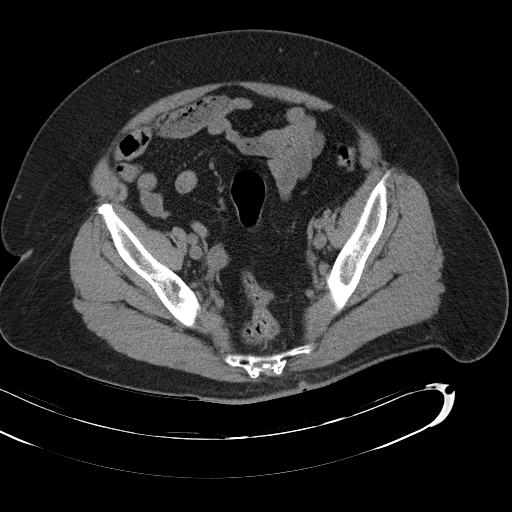
[im 30/89  soft-tissue]
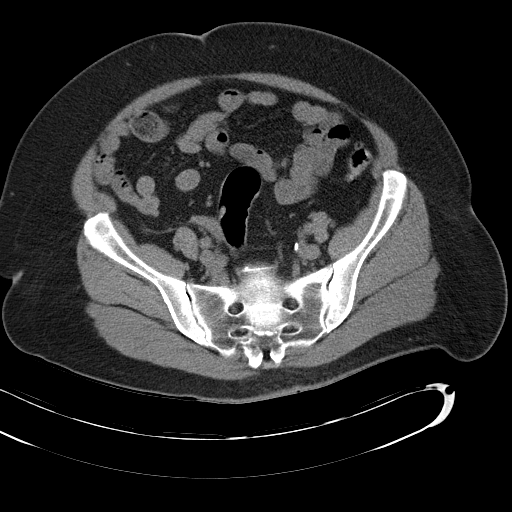
[im 34/89  soft-tissue]
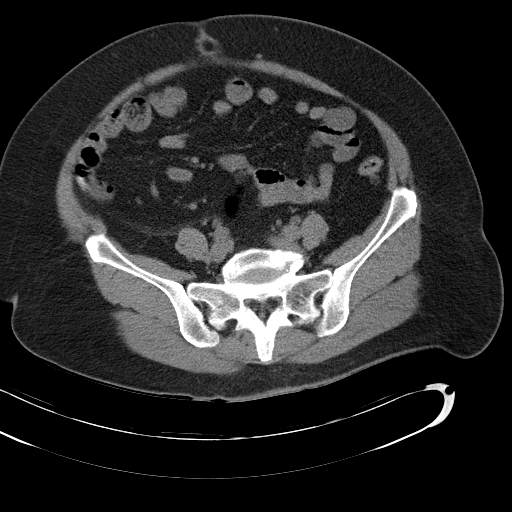
[im 42/89  soft-tissue]
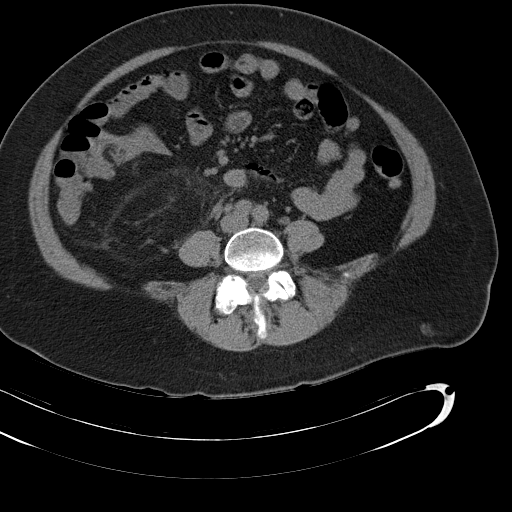
[im 47/89  soft-tissue]
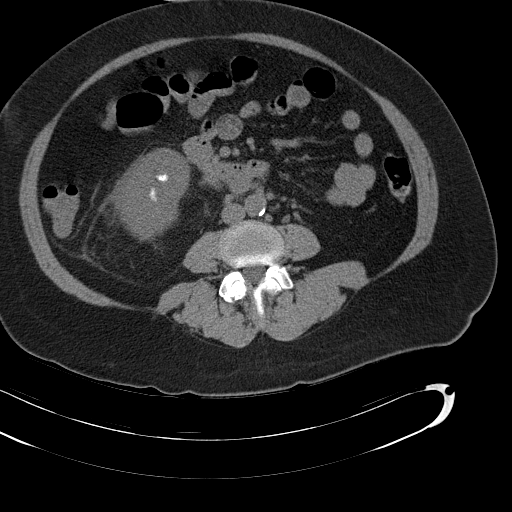
[im 55/89  soft-tissue]
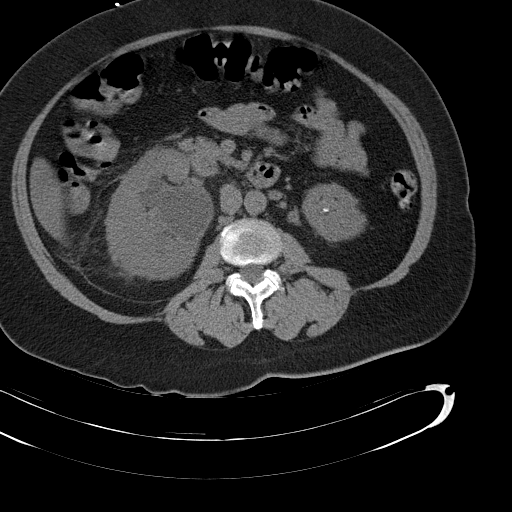
[im 55/89  bone]
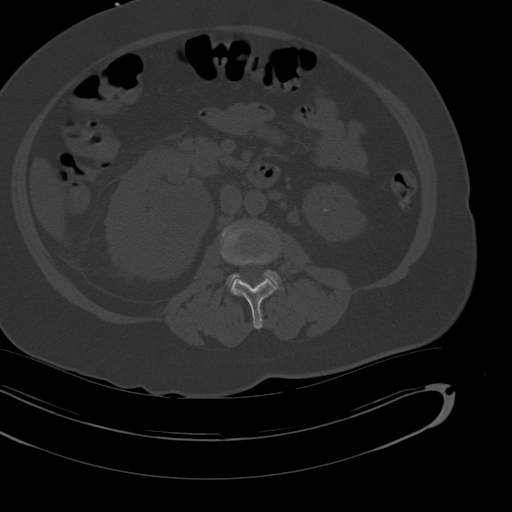
[im 59/89  soft-tissue]
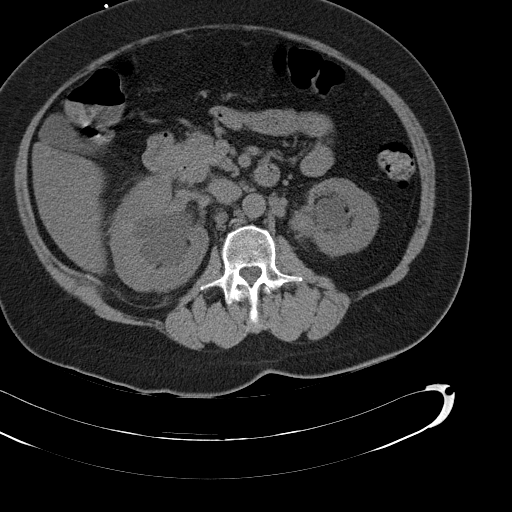
[im 68/89  soft-tissue]
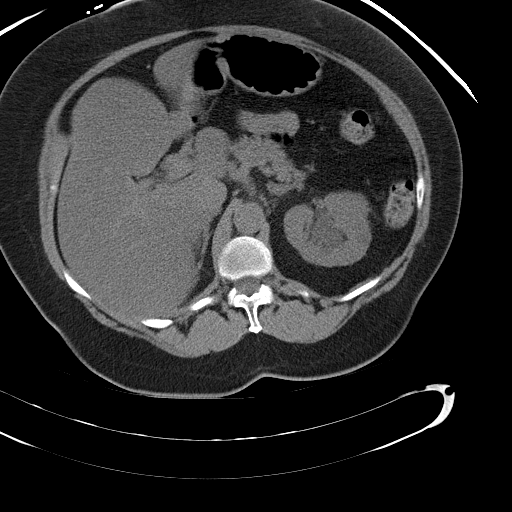
[im 72/89  soft-tissue]
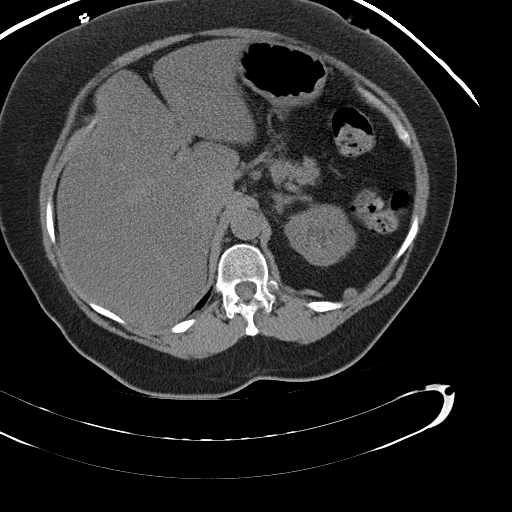
[im 76/89  soft-tissue]
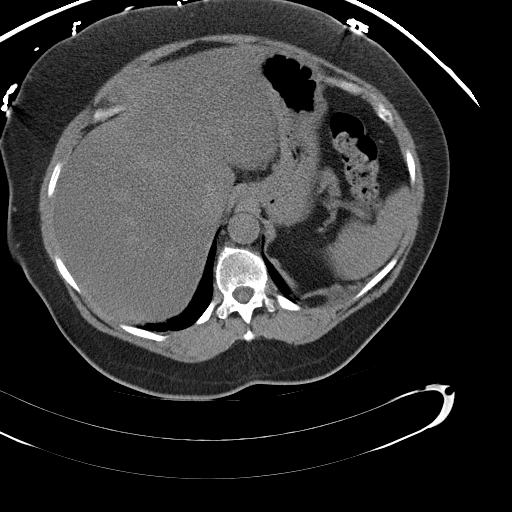
[im 84/89  soft-tissue]
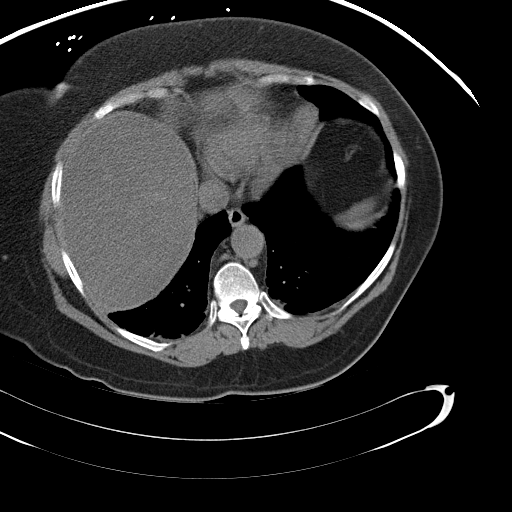

[Series 4: mpr coronal · coronal · 0.87mm/px · 3 of 102 slices shown]
[im 34/102  soft-tissue]
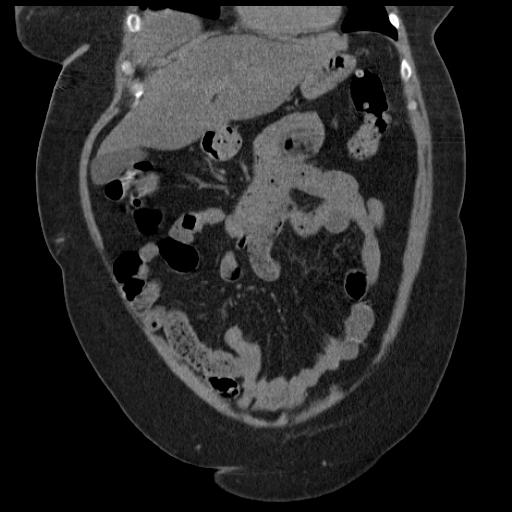
[im 45/102  soft-tissue]
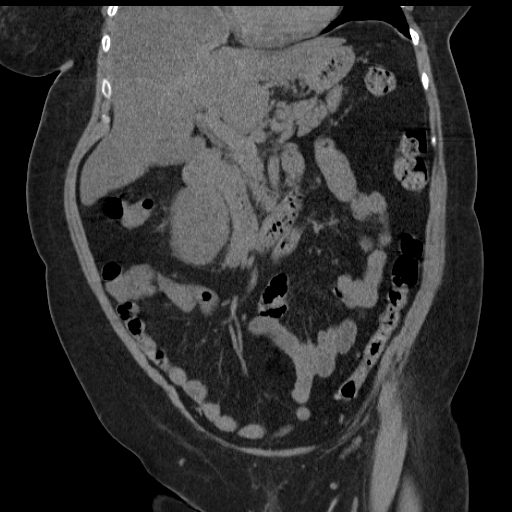
[im 57/102  soft-tissue]
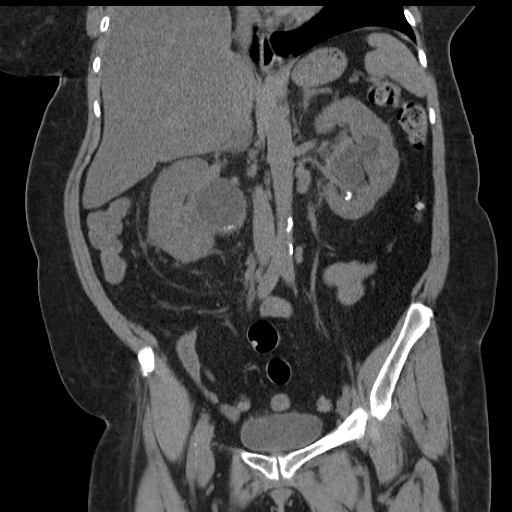

[17 of 46 positions shown; findings below may reference images not displayed]

FINDINGS: Linear densities are seen in the lung bases, scarring
versus atelectasis.  No effusions.  Heart is normal size.

There is fatty infiltration of the liver.  Gallbladder, spleen,
pancreas, adrenals are unremarkable.

There is a large left ureteral pelvic stone, measuring 20 mm in
diameter.  Moderate left hydronephrosis present.  Other smaller
lower pole stones on the left.

On the right, the kidney is enlarged with surrounding perinephric
stranding.  Numerous stones are seen in the right kidney, including
a 14 mm right lower pole stone and a 10 mm stone layering in the
dilated renal pelvis.  There is marked right hydronephrosis.  7 mm
obstructing stone noted at the right ureteral pelvic junction.
Both ureters decompressed.  No additional stones.  Bladder is
unremarkable.

The patient is status post hysterectomy.  Scattered descending
colonic and sigmoid diverticula.  No active diverticulitis.  Small
bowel is decompressed.  No free fluid or free air.

No acute bony abnormality.
IMPRESSION: Marked enlargement of the right kidney with severe right
hydronephrosis due to 7 mm obstructing right UPJ stone.  Numerous
other small and large renal stones and stones layering in the
dilated renal pelvis.  Perinephric stranding.

20 mm left UPJ stone with moderate left hydronephrosis.  Other
smaller left renal calculi noted.

Fatty infiltration of the liver.

Bibasilar atelectasis or scarring.

Colonic diverticulosis.

## 2012-06-26 ENCOUNTER — Telehealth: Payer: Self-pay | Admitting: Family Medicine

## 2012-06-27 NOTE — Telephone Encounter (Signed)
Given to patient  

## 2012-06-29 IMAGING — CR DG ABDOMEN 1V
2 series · 2 of 2 positions shown · non-contrast
Comparison: 01/06/2010

CLINICAL DATA: Renal calculi status post right side lithotripsy,
follow-up

ABDOMEN - 1 VIEW

[view not recorded (1 of 2)]
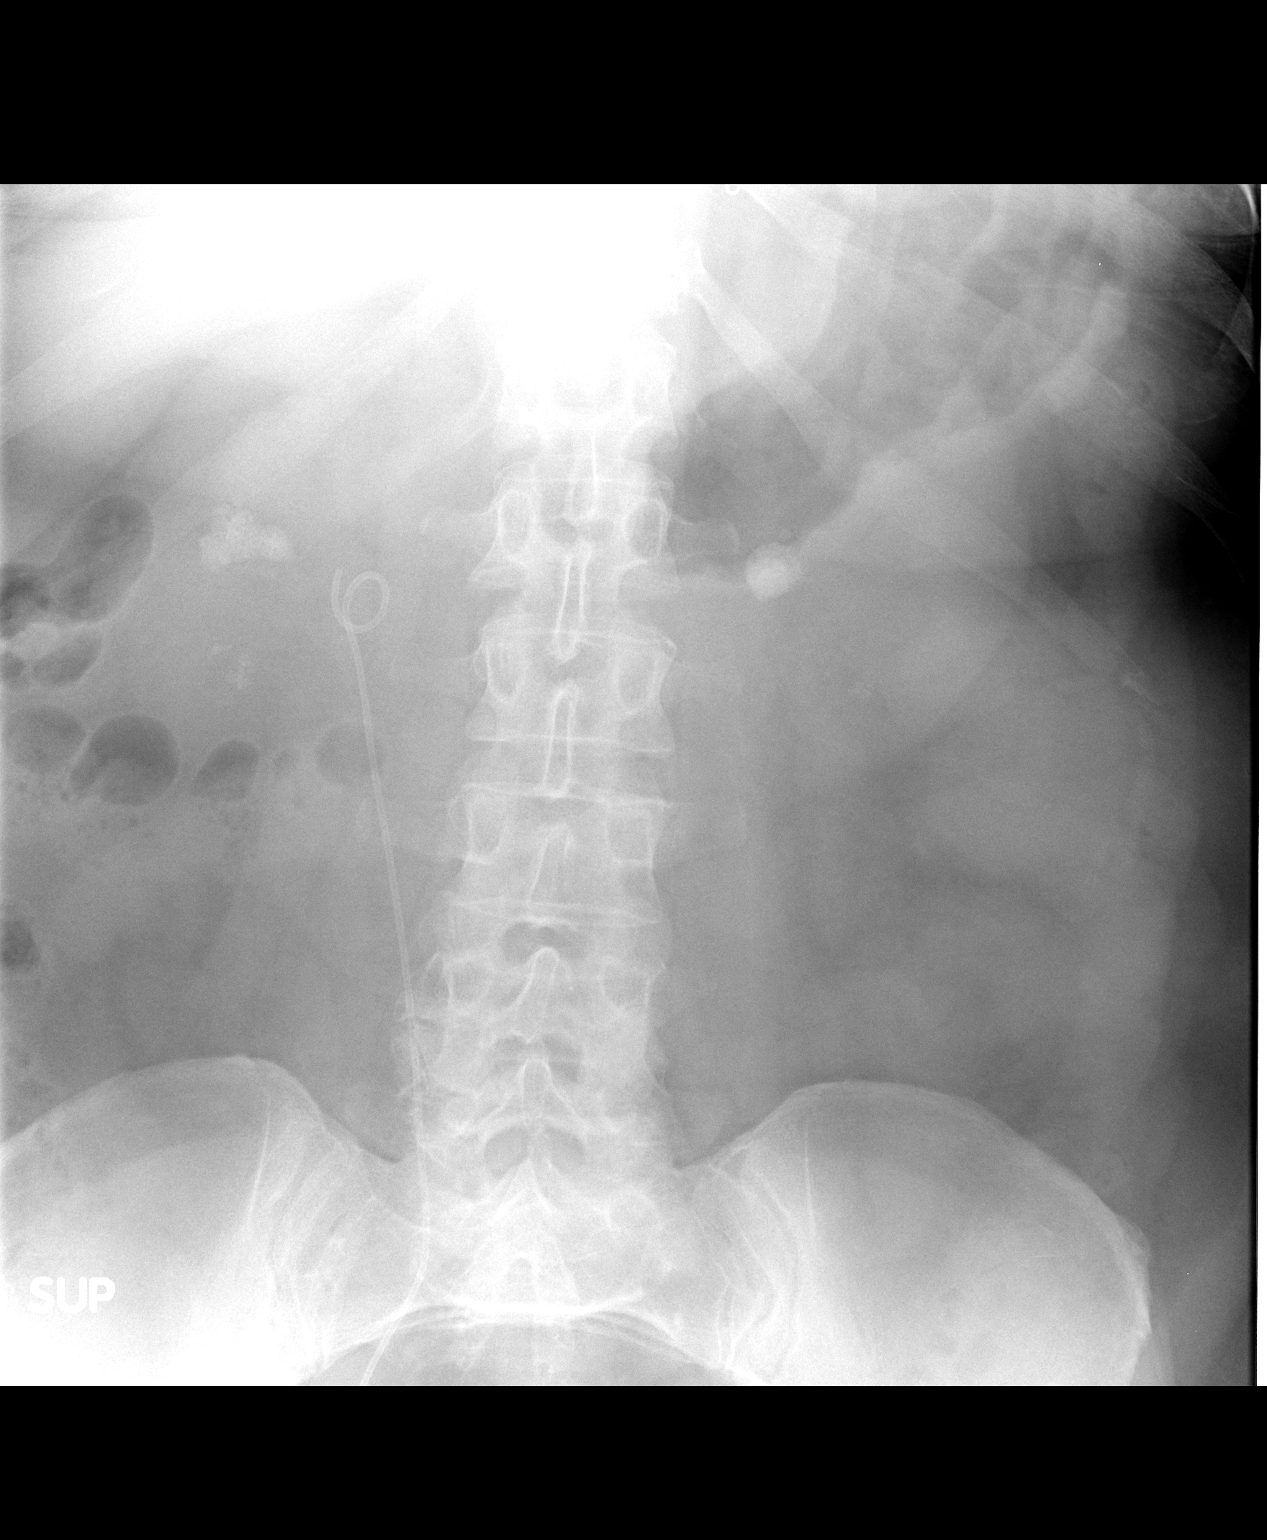

[view not recorded (2 of 2)]
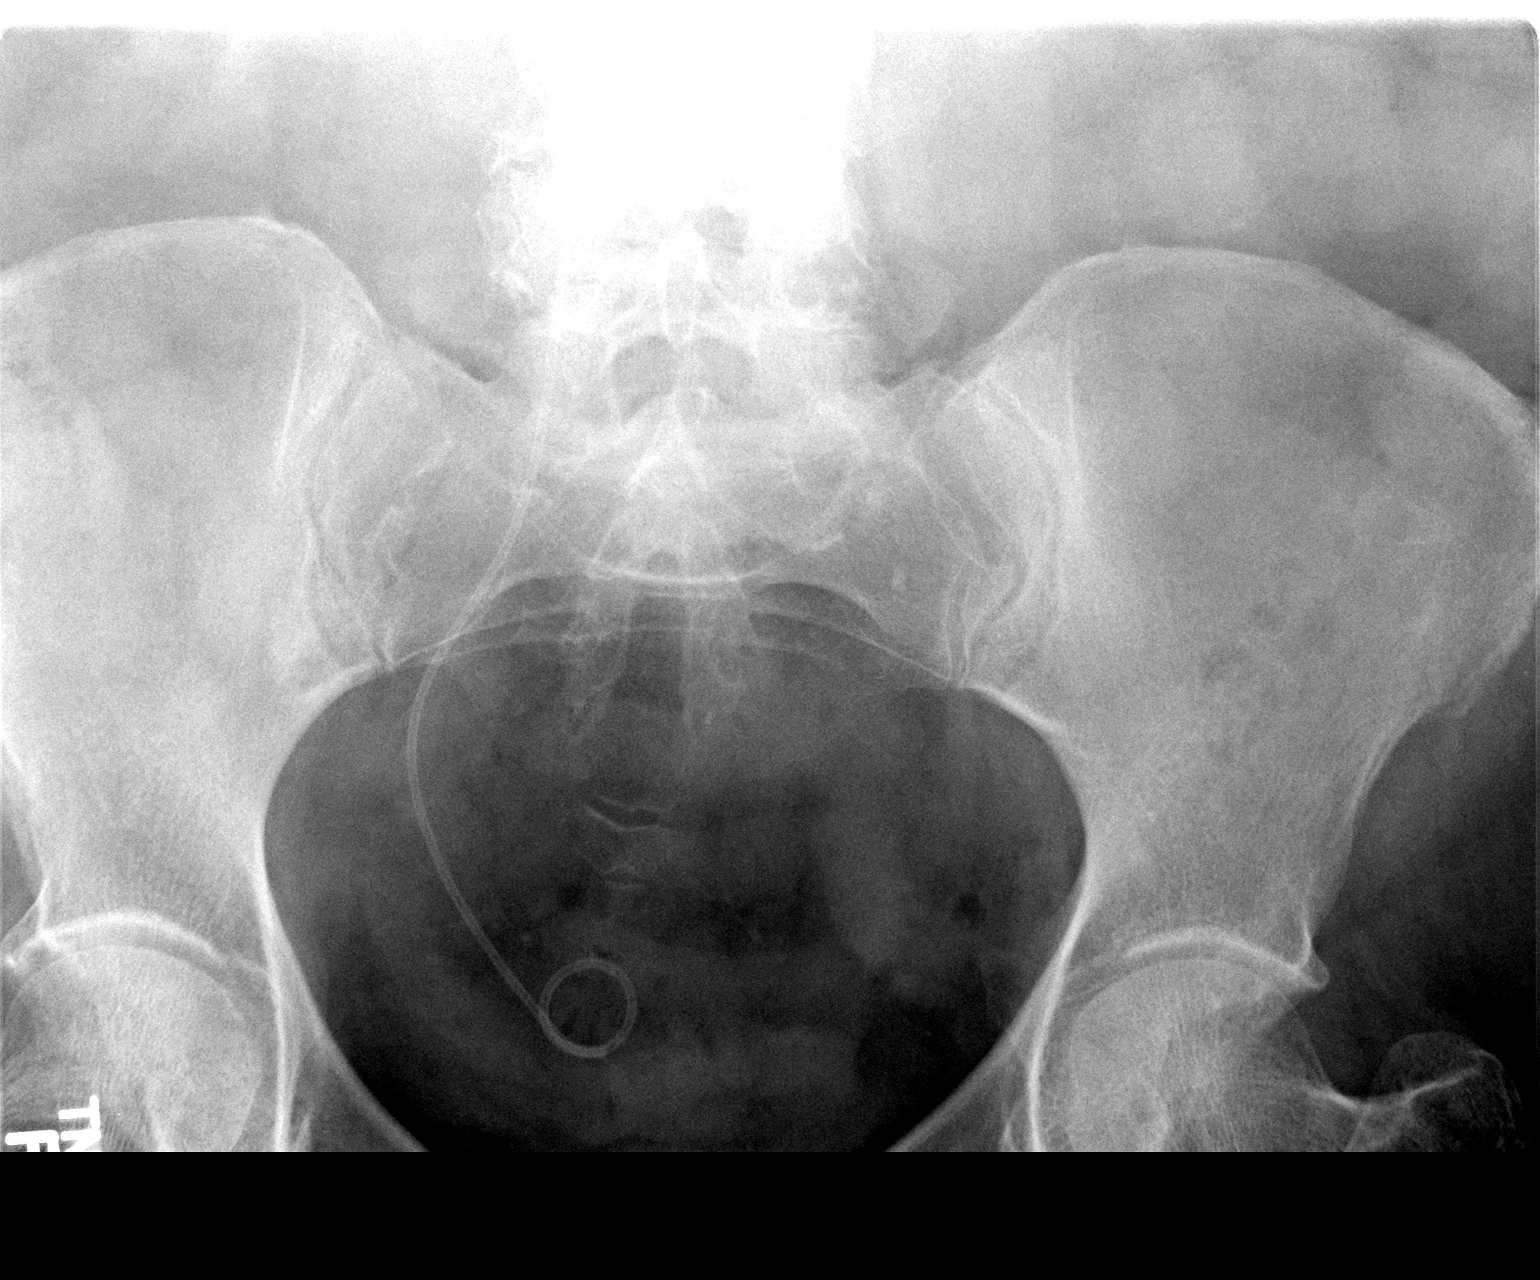

[2 of 2 positions shown; findings below may reference images not displayed]

FINDINGS: Interval placement of a right ureteral stent.
Large calculus LEFT renal pelvis [DATE] x 1.6 cm.
Large calculus mid RIGHT kidney, 2.7 x 1.8 cm.
Small calculi or stone fragments at mid to lower pole RIGHT kidney.
No definite calcifications identified along course of right
ureteral stent.
Few scattered phleboliths.
Bowel gas pattern normal.
Bones appear demineralized.
IMPRESSION: Bilateral renal calculi as above.

## 2012-07-09 ENCOUNTER — Other Ambulatory Visit: Payer: Self-pay | Admitting: Family Medicine

## 2012-07-10 ENCOUNTER — Telehealth: Payer: Self-pay | Admitting: Family Medicine

## 2012-07-10 NOTE — Telephone Encounter (Signed)
Has been faxed over

## 2012-07-22 ENCOUNTER — Other Ambulatory Visit: Payer: Self-pay | Admitting: Family Medicine

## 2012-08-08 ENCOUNTER — Encounter: Payer: Self-pay | Admitting: *Deleted

## 2012-08-09 IMAGING — MG MM DIGITAL SCREENING
4 series · 4 of 4 positions shown · non-contrast
Comparison: none

DG SCREEN MAMMOGRAM BILATERAL
Bilateral CC and MLO view(s) were taken.

DIGITAL SCREENING MAMMOGRAM WITH CAD:
There are scattered fibroglandular densities.  No masses or malignant type calcifications are 
identified.  Compared with prior studies.
Images were processed with CAD.

[L CC]
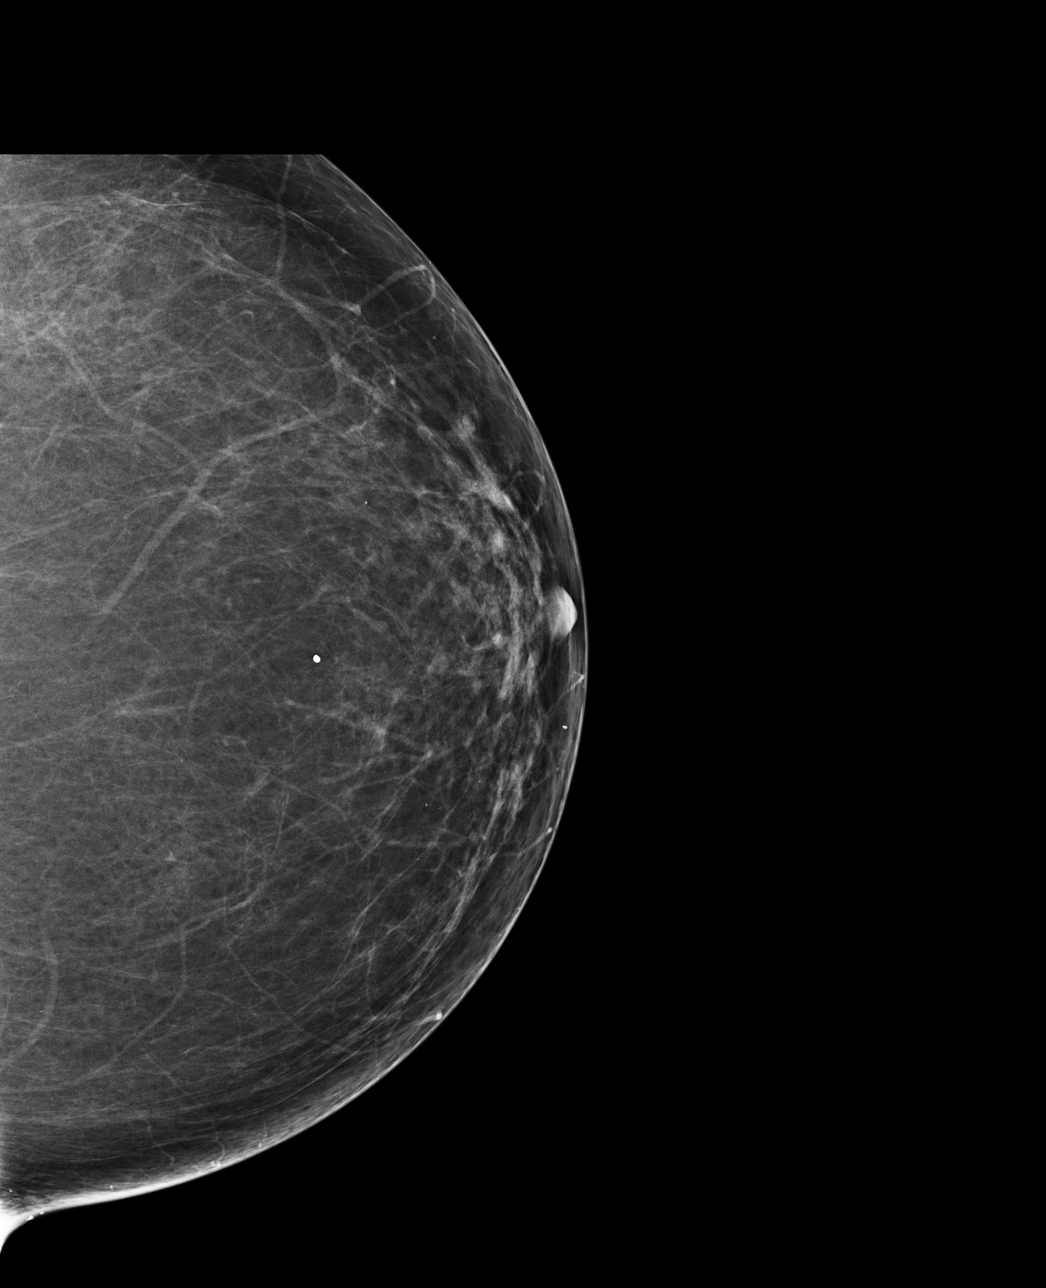

[L MLO]
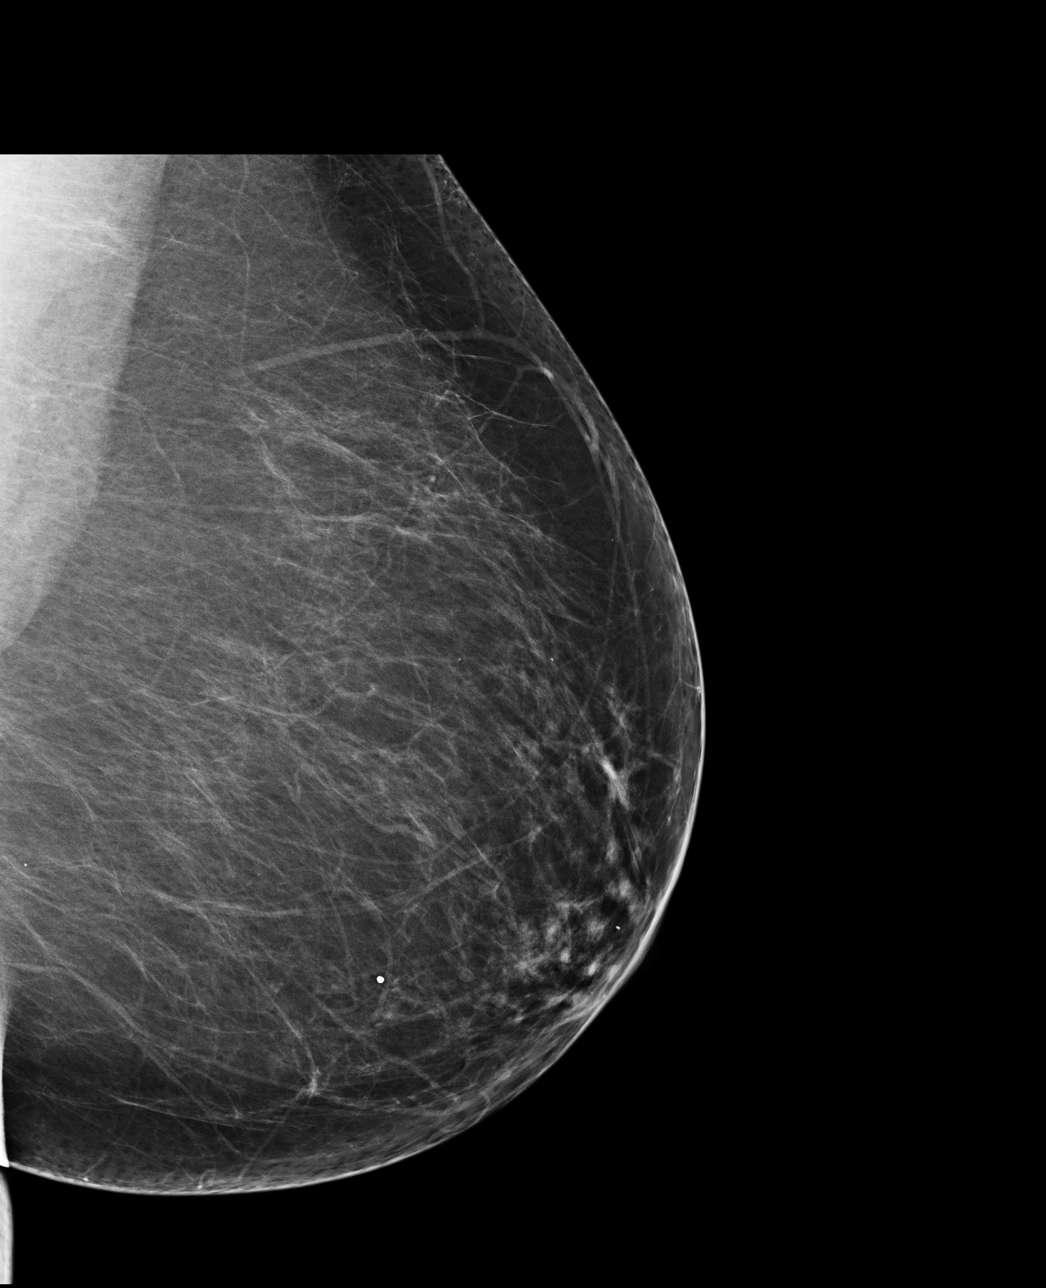

[R CC]
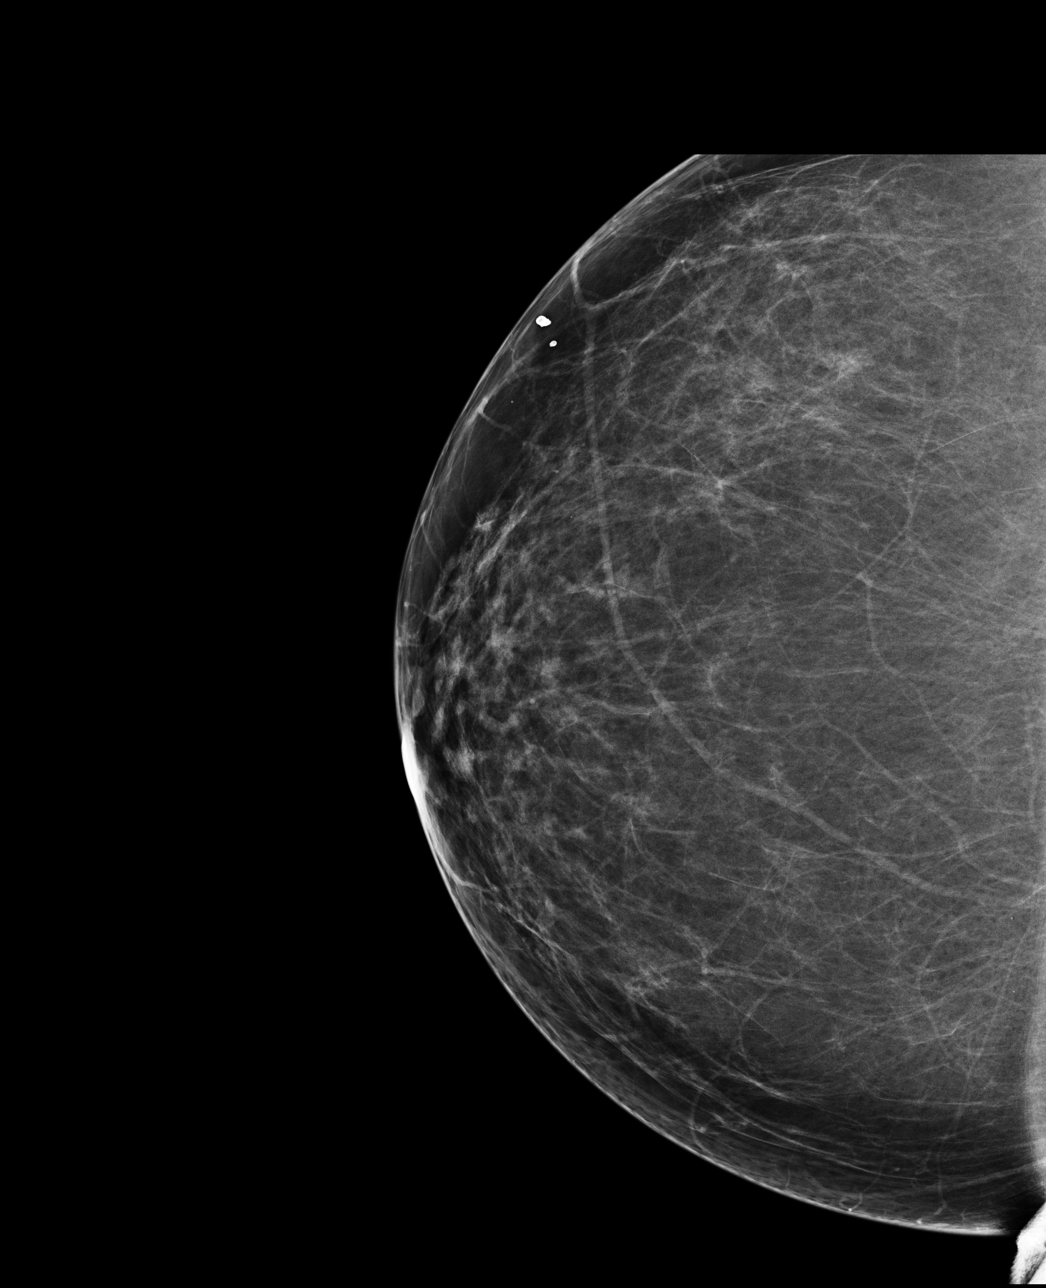

[R MLO]
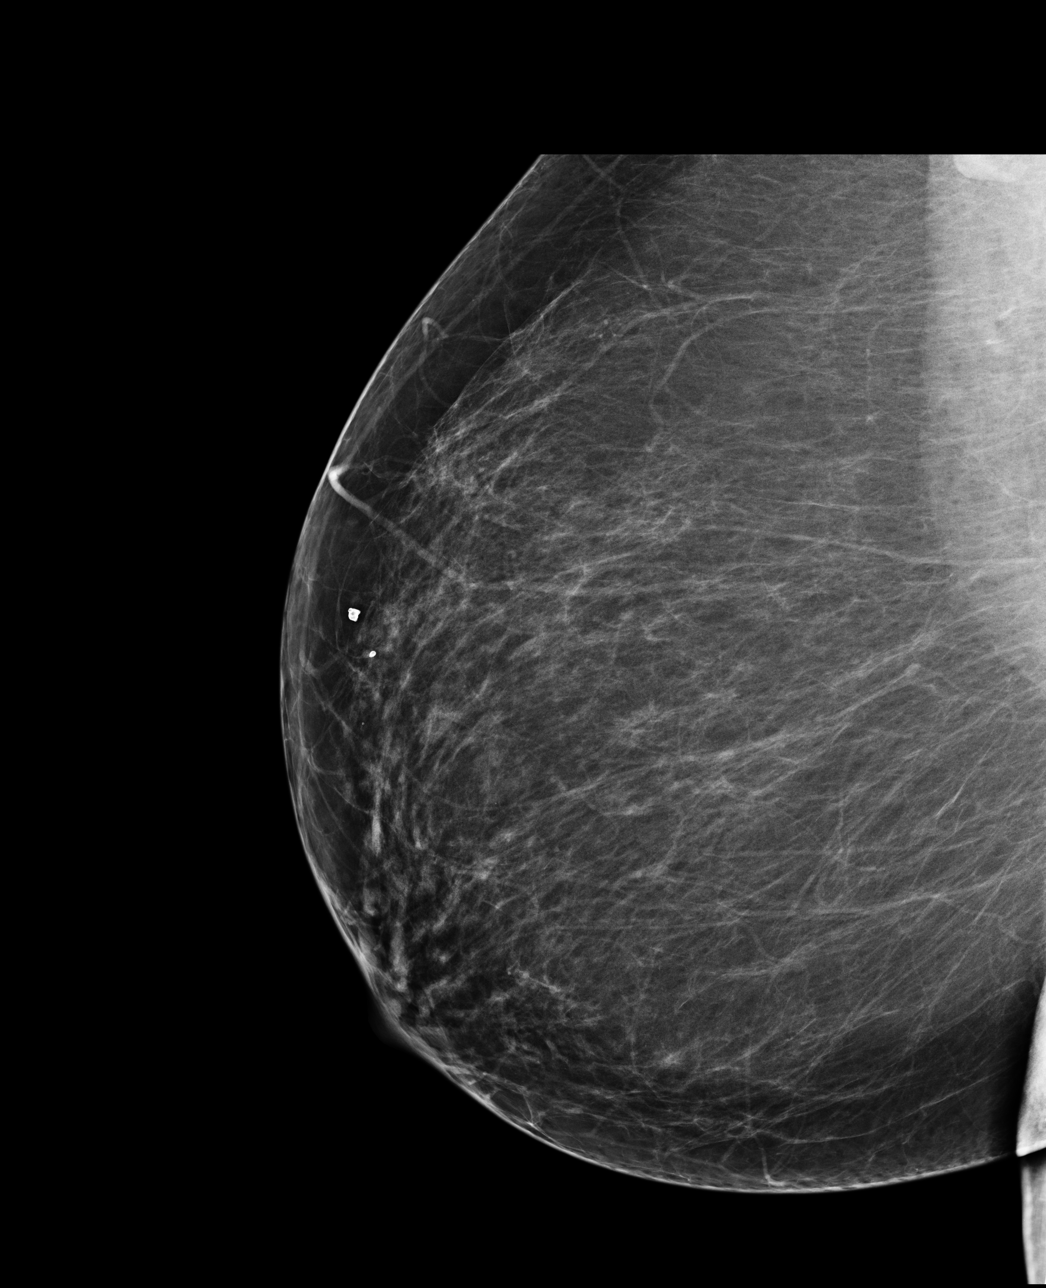

[4 of 4 positions shown; findings below may reference images not displayed]

IMPRESSION: No specific mammographic evidence of malignancy.  Next screening mammogram is recommended in one 
year.

A result letter of this screening mammogram will be mailed directly to the patient.

ASSESSMENT: Negative - BI-RADS 1

Screening mammogram in 1 year.
,

## 2012-08-13 ENCOUNTER — Encounter: Payer: Self-pay | Admitting: Family Medicine

## 2012-08-13 ENCOUNTER — Ambulatory Visit (INDEPENDENT_AMBULATORY_CARE_PROVIDER_SITE_OTHER): Payer: Medicare Other | Admitting: Family Medicine

## 2012-08-13 VITALS — BP 120/84 | HR 95 | Resp 18 | Ht 67.0 in | Wt 206.0 lb

## 2012-08-13 DIAGNOSIS — F329 Major depressive disorder, single episode, unspecified: Secondary | ICD-10-CM

## 2012-08-13 DIAGNOSIS — I10 Essential (primary) hypertension: Secondary | ICD-10-CM

## 2012-08-13 DIAGNOSIS — R079 Chest pain, unspecified: Secondary | ICD-10-CM

## 2012-08-13 DIAGNOSIS — F3289 Other specified depressive episodes: Secondary | ICD-10-CM

## 2012-08-13 DIAGNOSIS — E669 Obesity, unspecified: Secondary | ICD-10-CM

## 2012-08-13 DIAGNOSIS — F419 Anxiety disorder, unspecified: Secondary | ICD-10-CM | POA: Insufficient documentation

## 2012-08-13 DIAGNOSIS — K219 Gastro-esophageal reflux disease without esophagitis: Secondary | ICD-10-CM

## 2012-08-13 DIAGNOSIS — E119 Type 2 diabetes mellitus without complications: Secondary | ICD-10-CM

## 2012-08-13 DIAGNOSIS — E785 Hyperlipidemia, unspecified: Secondary | ICD-10-CM

## 2012-08-13 DIAGNOSIS — F32A Depression, unspecified: Secondary | ICD-10-CM | POA: Insufficient documentation

## 2012-08-13 MED ORDER — METFORMIN HCL 500 MG PO TABS: 1000.0000 mg | ORAL_TABLET | Freq: Two times a day (BID) | ORAL | Status: DC

## 2012-08-13 MED ORDER — OMEPRAZOLE 20 MG PO CPDR: 20.0000 mg | DELAYED_RELEASE_CAPSULE | Freq: Every day | ORAL | Status: DC

## 2012-08-13 MED ORDER — AMLODIPINE BESYLATE 5 MG PO TABS: 5.0000 mg | ORAL_TABLET | Freq: Every day | ORAL | Status: DC

## 2012-08-13 MED ORDER — VENLAFAXINE HCL 37.5 MG PO TABS: 37.5000 mg | ORAL_TABLET | Freq: Two times a day (BID) | ORAL | Status: DC

## 2012-08-13 NOTE — Progress Notes (Signed)
  Subjective:    Patient ID: Cheryl Abbott, female    DOB: 16-Nov-1945, 67 y.o.   MRN: [id number]  HPI The PT is here for follow up and re-evaluation of chronic medical conditions, medication management and review of any available recent lab and radiology data.  Preventive health is updated, specifically  Cancer screening and Immunization.   Questions or concerns regarding consultations or procedures which the PT has had in the interim are  addressed. Intermittent substernal chest tightness lasting less than 1 minute approx twice per week on awakening, , non radiating, no associated nausea, diaphoresis or light headedness  States all she does is eat and sleep worsening in the past approx 26month, will carry out formal depression screen. She is not suicidal or homicidal, denies hallucinations      Review of Systems See HPI Denies recent fever or chills. Denies sinus pressure, nasal congestion, ear pain or sore throat. Denies chest congestion, productive cough or wheezing. Denies PND, orhtopnea, palpitations and leg swelling Denies abdominal pain, nausea, vomiting,diarrhea or constipation.   Denies dysuria, frequency, hesitancy or incontinence. Denies joint pain, swelling and limitation in mobility. Denies headaches, seizures, numbness, or tingling.  Denies skin break down or rash.        Objective:   Physical Exam  Patient alert and oriented and in no cardiopulmonary distress.  HEENT: No facial asymmetry, EOMI, no sinus tenderness,  oropharynx pink and moist.  Neck supple no adenopathy.  Chest: Clear to auscultation bilaterally.No reproducible chest wall tenderness  CVS: S1, S2 no murmurs, no S3.  ABD: Soft non tender. Bowel sounds normal.  Ext: No edema  MS: Adequate ROM spine, shoulders, hips and knees.  Skin: Intact, no ulcerations or rash noted.  Psych: Good eye contact, flat  affect. Memory intact not anxious or depressed appearing.  CNS: CN 2-12 intact, power,  tone and sensation normal throughout.       Assessment & Plan:

## 2012-08-13 NOTE — Assessment & Plan Note (Signed)
Uncontrolled, pt is not suicidal or homicidal, she however has significant psychomotor retardation Will add effexor

## 2012-08-13 NOTE — Patient Instructions (Addendum)
F/u in 3 month   New additional medication for depression, and start working on cleaning up a 2nd room in your hose and exercising inside the house for 30 minutes every day  You are referred for eye exam.  You need labs drawn that werepreviously ordered, also expect to give urine that morning for testing also for microalb  Please work on weight loss

## 2012-08-13 NOTE — Addendum Note (Signed)
Addended by: Denman George B on: 08/13/2012 05:00 PM   Modules accepted: Orders, Medications

## 2012-08-13 NOTE — Assessment & Plan Note (Signed)
Uncontrolled, c/o substernal chest discomfort, however no interest in taking med daily, states it makes her "hot"

## 2012-08-13 NOTE — Assessment & Plan Note (Signed)
Controlled, no change in medication DASH diet and commitment to daily physical activity for a minimum of 30 minutes discussed and encouraged, as a part of hypertension management. The importance of attaining a healthy weight is also discussed.  

## 2012-08-13 NOTE — Assessment & Plan Note (Signed)
Deteriorated. Patient re-educated about  the importance of commitment to a  minimum of 150 minutes of exercise per week. The importance of healthy food choices with portion control discussed. Encouraged to start a food diary, count calories and to consider  joining a support group. Sample diet sheets offered. Goals set by the patient for the next several months.    

## 2012-08-13 NOTE — Assessment & Plan Note (Signed)
Atypical chest pain by history, with normal EKG, pt advised no EKG evidence of acute ischemia .She has no significant family h/o cAD

## 2012-08-13 NOTE — Assessment & Plan Note (Signed)
Hyperlipidemia:Low fat diet discussed and encouraged.  Updated lab asap

## 2012-08-13 NOTE — Assessment & Plan Note (Signed)
Patient educated about the importance of limiting  Carbohydrate intake , the need to commit to daily physical activity for a minimum of 30 minutes , and to commit weight loss. The fact that changes in all these areas will reduce or eliminate all together the development of diabetes is stressed.   Updated lab asap

## 2012-08-29 ENCOUNTER — Encounter: Payer: Self-pay | Admitting: Family Medicine

## 2012-08-30 ENCOUNTER — Other Ambulatory Visit: Payer: Self-pay | Admitting: Family Medicine

## 2012-09-05 LAB — HEMOGLOBIN A1C
Hgb A1c MFr Bld: 6.6 % — ABNORMAL HIGH (ref <5.7)
Mean Plasma Glucose: 143 mg/dL — ABNORMAL HIGH (ref <117)

## 2012-09-05 LAB — COMPLETE METABOLIC PANEL WITH GFR
ALT: 15 U/L (ref 0–35)
AST: 14 U/L (ref 0–37)
Albumin: 4.5 g/dL (ref 3.5–5.2)
Alkaline Phosphatase: 98 U/L (ref 39–117)
BUN: 12 mg/dL (ref 6–23)
CO2: 30 mEq/L (ref 19–32)
Calcium: 9.9 mg/dL (ref 8.4–10.5)
Chloride: 103 mEq/L (ref 96–112)
Creat: 0.94 mg/dL (ref 0.50–1.10)
GFR, Est African American: 73 mL/min
GFR, Est Non African American: 63 mL/min
Glucose, Bld: 100 mg/dL — ABNORMAL HIGH (ref 70–99)
Potassium: 4.2 mEq/L (ref 3.5–5.3)
Sodium: 139 mEq/L (ref 135–145)
Total Bilirubin: 0.2 mg/dL — ABNORMAL LOW (ref 0.3–1.2)
Total Protein: 7.9 g/dL (ref 6.0–8.3)

## 2012-09-05 LAB — LIPID PANEL
Cholesterol: 139 mg/dL (ref 0–200)
HDL: 43 mg/dL (ref >39)
LDL Cholesterol: 72 mg/dL (ref 0–99)
Total CHOL/HDL Ratio: 3.2 Ratio
Triglycerides: 119 mg/dL (ref <150)
VLDL: 24 mg/dL (ref 0–40)

## 2012-09-06 LAB — MICROALBUMIN / CREATININE URINE RATIO
Creatinine, Urine: 115.7 mg/dL
Microalb Creat Ratio: 47.1 mg/g — ABNORMAL HIGH (ref 0.0–30.0)
Microalb, Ur: 5.45 mg/dL — ABNORMAL HIGH (ref 0.00–1.89)

## 2012-09-17 ENCOUNTER — Ambulatory Visit (HOSPITAL_COMMUNITY)
Admission: RE | Admit: 2012-09-17 | Discharge: 2012-09-17 | Disposition: A | Discharge status: Home / Self Care | Payer: Medicare Other | Source: Ambulatory Visit | Attending: Urology | Admitting: Urology

## 2012-09-17 ENCOUNTER — Other Ambulatory Visit: Payer: Self-pay | Admitting: Urology

## 2012-09-17 DIAGNOSIS — N200 Calculus of kidney: Secondary | ICD-10-CM

## 2012-09-17 DIAGNOSIS — Z9889 Other specified postprocedural states: Secondary | ICD-10-CM | POA: Insufficient documentation

## 2012-09-17 DIAGNOSIS — R109 Unspecified abdominal pain: Secondary | ICD-10-CM | POA: Insufficient documentation

## 2012-09-23 ENCOUNTER — Ambulatory Visit (INDEPENDENT_AMBULATORY_CARE_PROVIDER_SITE_OTHER): Payer: Medicare Other | Admitting: Urology

## 2012-09-23 DIAGNOSIS — N200 Calculus of kidney: Secondary | ICD-10-CM

## 2012-10-02 ENCOUNTER — Other Ambulatory Visit: Payer: Self-pay | Admitting: Family Medicine

## 2012-10-03 ENCOUNTER — Other Ambulatory Visit: Payer: Self-pay | Admitting: Family Medicine

## 2012-10-25 ENCOUNTER — Emergency Department (HOSPITAL_COMMUNITY): Payer: Medicare Other

## 2012-10-25 ENCOUNTER — Emergency Department (HOSPITAL_COMMUNITY)
Admission: EM | Admit: 2012-10-25 | Discharge: 2012-10-25 | Disposition: A | Discharge status: Home / Self Care | Payer: Medicare Other | Attending: Emergency Medicine | Admitting: Emergency Medicine

## 2012-10-25 DIAGNOSIS — Z79899 Other long term (current) drug therapy: Secondary | ICD-10-CM | POA: Insufficient documentation

## 2012-10-25 DIAGNOSIS — E109 Type 1 diabetes mellitus without complications: Secondary | ICD-10-CM | POA: Insufficient documentation

## 2012-10-25 DIAGNOSIS — Z87442 Personal history of urinary calculi: Secondary | ICD-10-CM | POA: Insufficient documentation

## 2012-10-25 DIAGNOSIS — E860 Dehydration: Secondary | ICD-10-CM | POA: Insufficient documentation

## 2012-10-25 DIAGNOSIS — Z951 Presence of aortocoronary bypass graft: Secondary | ICD-10-CM | POA: Insufficient documentation

## 2012-10-25 DIAGNOSIS — F329 Major depressive disorder, single episode, unspecified: Secondary | ICD-10-CM | POA: Insufficient documentation

## 2012-10-25 DIAGNOSIS — R0602 Shortness of breath: Secondary | ICD-10-CM | POA: Insufficient documentation

## 2012-10-25 DIAGNOSIS — I10 Essential (primary) hypertension: Secondary | ICD-10-CM | POA: Insufficient documentation

## 2012-10-25 DIAGNOSIS — F3289 Other specified depressive episodes: Secondary | ICD-10-CM | POA: Insufficient documentation

## 2012-10-25 DIAGNOSIS — Z880 Allergy status to penicillin: Secondary | ICD-10-CM | POA: Insufficient documentation

## 2012-10-25 DIAGNOSIS — Z87891 Personal history of nicotine dependence: Secondary | ICD-10-CM | POA: Insufficient documentation

## 2012-10-25 DIAGNOSIS — F0390 Unspecified dementia without behavioral disturbance: Secondary | ICD-10-CM | POA: Insufficient documentation

## 2012-10-25 DIAGNOSIS — R112 Nausea with vomiting, unspecified: Secondary | ICD-10-CM | POA: Insufficient documentation

## 2012-10-25 DIAGNOSIS — R079 Chest pain, unspecified: Secondary | ICD-10-CM

## 2012-10-25 DIAGNOSIS — R1110 Vomiting, unspecified: Secondary | ICD-10-CM

## 2012-10-25 DIAGNOSIS — F411 Generalized anxiety disorder: Secondary | ICD-10-CM | POA: Insufficient documentation

## 2012-10-25 DIAGNOSIS — R197 Diarrhea, unspecified: Secondary | ICD-10-CM | POA: Insufficient documentation

## 2012-10-25 DIAGNOSIS — R109 Unspecified abdominal pain: Secondary | ICD-10-CM | POA: Insufficient documentation

## 2012-10-25 DIAGNOSIS — I2510 Atherosclerotic heart disease of native coronary artery without angina pectoris: Secondary | ICD-10-CM | POA: Insufficient documentation

## 2012-10-25 LAB — CBC WITH DIFFERENTIAL/PLATELET
Basophils Absolute: 0 10*3/uL (ref 0.0–0.1)
Basophils Relative: 0 % (ref 0–1)
Eosinophils Absolute: 0.1 10*3/uL (ref 0.0–0.7)
Eosinophils Relative: 2 % (ref 0–5)
HCT: 41 % (ref 36.0–46.0)
Hemoglobin: 13.1 g/dL (ref 12.0–15.0)
Lymphocytes Relative: 24 % (ref 12–46)
Lymphs Abs: 1.7 10*3/uL (ref 0.7–4.0)
MCH: 27.6 pg (ref 26.0–34.0)
MCHC: 32 g/dL (ref 30.0–36.0)
MCV: 86.5 fL (ref 78.0–100.0)
Monocytes Absolute: 0.5 10*3/uL (ref 0.1–1.0)
Monocytes Relative: 7 % (ref 3–12)
Neutro Abs: 4.9 10*3/uL (ref 1.7–7.7)
Neutrophils Relative %: 67 % (ref 43–77)
Platelets: 333 10*3/uL (ref 150–400)
RBC: 4.74 MIL/uL (ref 3.87–5.11)
RDW: 18.4 % — ABNORMAL HIGH (ref 11.5–15.5)
WBC: 7.2 10*3/uL (ref 4.0–10.5)

## 2012-10-25 LAB — TROPONIN I: Troponin I: <0.3 ng/mL (ref <0.30)

## 2012-10-25 LAB — COMPREHENSIVE METABOLIC PANEL
ALT: 16 U/L (ref 0–35)
AST: 15 U/L (ref 0–37)
Albumin: 3.9 g/dL (ref 3.5–5.2)
Alkaline Phosphatase: 113 U/L (ref 39–117)
BUN: 17 mg/dL (ref 6–23)
CO2: 27 mEq/L (ref 19–32)
Calcium: 10.6 mg/dL — ABNORMAL HIGH (ref 8.4–10.5)
Chloride: 96 mEq/L (ref 96–112)
Creatinine, Ser: 1.41 mg/dL — ABNORMAL HIGH (ref 0.50–1.10)
GFR calc Af Amer: 43 mL/min — ABNORMAL LOW (ref >90)
GFR calc non Af Amer: 37 mL/min — ABNORMAL LOW (ref >90)
Glucose, Bld: 210 mg/dL — ABNORMAL HIGH (ref 70–99)
Potassium: 4.5 mEq/L (ref 3.5–5.1)
Sodium: 135 mEq/L (ref 135–145)
Total Bilirubin: 0.2 mg/dL — ABNORMAL LOW (ref 0.3–1.2)
Total Protein: 8.6 g/dL — ABNORMAL HIGH (ref 6.0–8.3)

## 2012-10-25 LAB — OCCULT BLOOD, POC DEVICE: Fecal Occult Bld: NEGATIVE

## 2012-10-25 LAB — LIPASE, BLOOD: Lipase: 44 U/L (ref 11–59)

## 2012-10-25 MED ORDER — SODIUM CHLORIDE 0.9 % IV BOLUS (SEPSIS)
500.0000 mL | Freq: Once | INTRAVENOUS | Status: AC
  Administered 2012-10-25: 500 mL via INTRAVENOUS

## 2012-10-25 NOTE — ED Provider Notes (Addendum)
History     CSN: [id number]  Arrival date & time 10/25/12  1208   First MD Initiated Contact with Patient 10/25/12 1229      Chief Complaint  Patient presents with  . Chest Pain  . Shortness of Breath  . Nausea  . Diarrhea  . Emesis     Patient is a 67 y.o. female presenting with vomiting. The history is provided by the patient.  Emesis Severity:  Moderate Timing:  Intermittent Progression:  Improving Chronicity:  New Relieved by:  Nothing Worsened by:  Nothing tried Associated symptoms: abdominal pain, chills and diarrhea   pt presents for multiple complaints She reports vomiting/diarrhea last week (nonbloody) with last episode of vomiting two days ago She also reports abdominal discomfort She also reports CP - she reports when she feels anxious her chest started to hurt She also feels SOB at times but none now  Past Medical History  Diagnosis Date  . Anxiety disorder   . Back pain   . Diabetes mellitus type 1   . Hyperlipidemia   . Hypertension   . Sleep apnea   . Disease of vein   . Coronary artery disease   . Complication of anesthesia     pt states she was not given enough medication with  tcs with Dr Tamala Julian                                    last [place].as able to feel and hear everything.  . Depression   . DEMENTIA   . Renal insufficiency   . Kidney stones     Past Surgical History  Procedure Laterality Date  . Partial hysterectomy    . Lithotryspy and stent replacement rt ureter  10/2004    12/2009 Dr, Michela Pitcher   . Removal of lump from posterior r thigh    . Colonoscopy  08/21/06    adenomatous polyps/  . Cardiac catheterization  2005  . Flexible sigmoidoscopy  09/11/2011    Procedure: FLEXIBLE SIGMOIDOSCOPY;  Surgeon: Danie Binder, MD;  Location: [facility];  Service: Endoscopy;  Laterality: N/A;  with propofol  . Extraction of left kidney stone    . Bilateral stone removal with nephrostomy at [facility]    . Abdominal hysterectomy      Family  History  Problem Relation Age of Onset  . Hypertension Mother   . Diabetes Mother   . Heart failure Mother   . Dementia Mother   . Emphysema Father   . Hypertension Father   . Diabetes Brother   . GER disease Brother   . Cancer      family history   . Diabetes      family history   . Heart defect      famiily history   . Arthritis      family history   . Anesthesia problems Neg Hx   . Hypotension Neg Hx   . Malignant hyperthermia Neg Hx   . Pseudochol deficiency Neg Hx     History  Substance Use Topics  . Smoking status: Former Smoker    Quit date: 09/21/1994  . Smokeless tobacco: Former Systems developer    Quit date: 09/04/1996  . Alcohol Use: No    OB History   Grav Para Term Preterm Abortions TAB SAB Ect Mult Living  Review of Systems  Constitutional: Positive for chills.  Respiratory: Positive for shortness of breath.   Cardiovascular: Positive for chest pain.  Gastrointestinal: Positive for vomiting, abdominal pain and diarrhea.  Neurological: Negative for syncope.  All other systems reviewed and are negative.    Allergies  Ace inhibitors; Keflex; Nitrofurantoin; and Penicillins  Home Medications   Current Outpatient Rx  Name  Route  Sig  Dispense  Refill  . ALPRAZolam (XANAX) 0.5 MG tablet   Oral   Take 0.5 mg by mouth 2 (two) times daily.         Marland Kitchen amLODipine (NORVASC) 5 MG tablet   Oral   Take 1 tablet (5 mg total) by mouth daily.   30 tablet   4   . diphenhydramine-acetaminophen (PAIN RELIEF PM EXTRA STRENGTH) 25-500 MG TABS   Oral   Take 2 tablets by mouth at bedtime.         Marland Kitchen ibuprofen (ADVIL,MOTRIN) 800 MG tablet   Oral   Take 800 mg by mouth daily. For pain         . lovastatin (MEVACOR) 40 MG tablet      Take two tablets at bedtime. Dose increase effective 02/14/2012   60 tablet   11   . metFORMIN (GLUCOPHAGE) 500 MG tablet   Oral   Take 2 tablets (1,000 mg total) by mouth 2 (two) times daily with a meal.    120 tablet   4   . omeprazole (PRILOSEC) 20 MG capsule   Oral   Take 1 capsule (20 mg total) by mouth daily.   30 capsule   3   . PARoxetine (PAXIL) 40 MG tablet   Oral   Take 40 mg by mouth daily.         . potassium citrate (UROCIT-K) 10 MEQ (1080 MG) SR tablet   Oral   Take 10 mEq by mouth 3 (three) times daily with meals.         . traMADol (ULTRAM) 50 MG tablet   Oral   Take 50 mg by mouth 2 (two) times daily.         Marland Kitchen venlafaxine (EFFEXOR) 37.5 MG tablet   Oral   Take 1 tablet (37.5 mg total) by mouth 2 (two) times daily.   30 tablet   4     BP 121/68  Pulse 96  Temp(Src) 98.7 F (37.1 C) (Oral)  Resp 18  Ht 5\' 6"  (1.676 m)  Wt 203 lb (92.08 kg)  BMI 32.78 kg/m2  SpO2 94%  Physical Exam CONSTITUTIONAL: Well developed/well nourished HEAD: Normocephalic/atraumatic EYES: EOMI/PERRL ENMT: Mucous membranes moist NECK: supple no meningeal signs SPINE:entire spine nontender CV: S1/S2 noted, no murmurs/rubs/gallops noted Chest - tender to palpation LUNGS: Lungs are clear to auscultation bilaterally, no apparent distress ABDOMEN: soft, nontender, no rebound or guarding Rectal - stool color normal, no blood or melena GU:no cva tenderness NEURO: Pt is awake/alert, moves all extremitiesx4 EXTREMITIES: pulses normal, full ROM SKIN: warm, color normal PSYCH: no abnormalities of mood noted  ED Course  Procedures (including critical care time)  Labs Reviewed  CBC WITH DIFFERENTIAL - Abnormal; Notable for the following:    RDW 18.4 (*)    All other components within normal limits  LIPASE, BLOOD  TROPONIN I  COMPREHENSIVE METABOLIC PANEL  OCCULT BLOOD, POC DEVICE   1:47 PM Pt with vomiting/diarrhea and also CP.  CP is reproducible, EKG unremarkable. CXR reviewed and negative Will rehydrate and reassess  3:19  PM Pt improved Taking PO without difficulty Mild dehydration noted She reports her biggest concern is the diarrhea that seems worse at  night Advised to f/u with PCP this week CP atypical and reproducible, doubt ACS/PE at this time  MDM  Nursing notes including past medical history and social history reviewed and considered in documentation Labs/vital reviewed and considered xrays reviewed and considered        Date: 10/25/2012  Rate: 106  Rhythm: sinus tachycardia  QRS Axis: normal  Intervals: normal  ST/T Wave abnormalities: normal  Conduction Disutrbances:none  Narrative Interpretation:   Old EKG Reviewed: unchanged    Sharyon Cable, MD 10/25/12 [place], MD 10/25/12 [phone]

## 2012-10-25 NOTE — ED Notes (Signed)
Patient taking po fluids without further nausea and vomiting.  No complaints offered at present, resting quietly and awaiting disposition.

## 2012-10-25 NOTE — ED Notes (Signed)
States that she has been waking up in the mornings with sharp chest pains and shortness of breath, states that she has been having nausea, vomiting and diarrhea all week.  States that she is able to keep foods down at times.  States that she has been running a fever at home of approx 99 all week.

## 2012-11-13 ENCOUNTER — Ambulatory Visit (INDEPENDENT_AMBULATORY_CARE_PROVIDER_SITE_OTHER): Payer: Medicare Other | Admitting: Family Medicine

## 2012-11-13 ENCOUNTER — Encounter: Payer: Self-pay | Admitting: Family Medicine

## 2012-11-13 VITALS — BP 124/82 | HR 100 | Resp 18 | Ht 67.0 in | Wt 208.0 lb

## 2012-11-13 DIAGNOSIS — I10 Essential (primary) hypertension: Secondary | ICD-10-CM

## 2012-11-13 DIAGNOSIS — E785 Hyperlipidemia, unspecified: Secondary | ICD-10-CM

## 2012-11-13 DIAGNOSIS — M549 Dorsalgia, unspecified: Secondary | ICD-10-CM

## 2012-11-13 DIAGNOSIS — F329 Major depressive disorder, single episode, unspecified: Secondary | ICD-10-CM

## 2012-11-13 DIAGNOSIS — E1129 Type 2 diabetes mellitus with other diabetic kidney complication: Secondary | ICD-10-CM

## 2012-11-13 DIAGNOSIS — E669 Obesity, unspecified: Secondary | ICD-10-CM

## 2012-11-13 DIAGNOSIS — F32A Depression, unspecified: Secondary | ICD-10-CM

## 2012-11-13 DIAGNOSIS — E119 Type 2 diabetes mellitus without complications: Secondary | ICD-10-CM

## 2012-11-13 DIAGNOSIS — F3289 Other specified depressive episodes: Secondary | ICD-10-CM

## 2012-11-13 NOTE — Patient Instructions (Addendum)
F/U in 4.5 month, call if  You need me before  Fasting lipid, cmp and EGFR, HBA1C early August  It is important that you exercise regularly at least 30 minutes 5 times a week. If you develop chest pain, have severe difficulty breathing, or feel very tired, stop exercising immediately and seek medical attention    A healthy diet is rich in fruit, vegetables and whole grains. Poultry fish, nuts and beans are a healthy choice for protein rather then red meat. A low sodium diet and drinking 64 ounces of water daily is generally recommended. Oils and sweet should be limited. Carbohydrates especially for those who are diabetic or overweight, should be limited to 30-45 gram per meal. It is important to eat on a regular schedule, at least 3 times daily. Snacks should be primarily fruits, vegetables or nuts.   Medication remains the same  Weight loss of 2 pounds per month

## 2012-11-13 NOTE — Progress Notes (Signed)
  Subjective:    Patient ID: Cheryl Abbott, female    DOB: 1945/06/23, 67 y.o.   MRN: [id number]  HPI The PT is here for follow up and re-evaluation of chronic medical conditions, medication management and review of any available recent lab and radiology data.  Preventive health is updated, specifically  Cancer screening and Immunization.   Questions or concerns regarding consultations or procedures which the PT has had in the interim are  addressed. The PT denies any adverse reactions to current medications since the last visit.  C/o weight gain, states she intends to work on lifestyle changes to address this There are no specific complaints  Denies polyuria, polydipsia or blurred vision, blood sugars are within expected range when she tests      Review of Systems See HPI Denies recent fever or chills. Denies sinus pressure, nasal congestion, ear pain or sore throat. Denies chest congestion, productive cough or wheezing. Denies chest pains, palpitations and leg swelling Denies abdominal pain, nausea, vomiting,diarrhea or constipation.   Denies dysuria, frequency, hesitancy or incontinence. Denies joint pain, swelling and limitation in mobility. Denies headaches, seizures, numbness, or tingling. Denies depression, anxiety or insomnia. Denies skin break down or rash.        Objective:   Physical Exam  Patient alert and oriented and in no cardiopulmonary distress.  HEENT: No facial asymmetry, EOMI, no sinus tenderness,  oropharynx pink and moist.  Neck supple no adenopathy.  Chest: Clear to auscultation bilaterally.  CVS: S1, S2 no murmurs, no S3.  ABD: Soft non tender. Bowel sounds normal.  Ext: No edema  MS: Adequate ROM spine, shoulders, hips and knees.  Skin: Intact, no ulcerations or rash noted.  Psych: Good eye contact, normal affect. Memory intact not anxious or depressed appearing.  CNS: CN 2-12 intact, power, tone and sensation normal throughout.       Assessment & Plan:

## 2012-11-20 ENCOUNTER — Telehealth: Payer: Self-pay | Admitting: Family Medicine

## 2012-11-20 NOTE — Telephone Encounter (Signed)
Coming to collect a meter we have in the office

## 2012-11-21 ENCOUNTER — Emergency Department (HOSPITAL_COMMUNITY): Payer: Medicare Other

## 2012-11-21 ENCOUNTER — Telehealth: Payer: Self-pay | Admitting: Family Medicine

## 2012-11-21 ENCOUNTER — Emergency Department (HOSPITAL_COMMUNITY)
Admission: EM | Admit: 2012-11-21 | Discharge: 2012-11-21 | Disposition: A | Discharge status: Home / Self Care | Payer: Medicare Other | Attending: Emergency Medicine | Admitting: Emergency Medicine

## 2012-11-21 ENCOUNTER — Encounter (HOSPITAL_COMMUNITY): Payer: Self-pay | Admitting: *Deleted

## 2012-11-21 DIAGNOSIS — I10 Essential (primary) hypertension: Secondary | ICD-10-CM | POA: Insufficient documentation

## 2012-11-21 DIAGNOSIS — G8929 Other chronic pain: Secondary | ICD-10-CM | POA: Insufficient documentation

## 2012-11-21 DIAGNOSIS — Z79899 Other long term (current) drug therapy: Secondary | ICD-10-CM | POA: Insufficient documentation

## 2012-11-21 DIAGNOSIS — F411 Generalized anxiety disorder: Secondary | ICD-10-CM | POA: Insufficient documentation

## 2012-11-21 DIAGNOSIS — Z8679 Personal history of other diseases of the circulatory system: Secondary | ICD-10-CM | POA: Insufficient documentation

## 2012-11-21 DIAGNOSIS — F3289 Other specified depressive episodes: Secondary | ICD-10-CM | POA: Insufficient documentation

## 2012-11-21 DIAGNOSIS — Z9889 Other specified postprocedural states: Secondary | ICD-10-CM | POA: Insufficient documentation

## 2012-11-21 DIAGNOSIS — M545 Low back pain, unspecified: Secondary | ICD-10-CM | POA: Insufficient documentation

## 2012-11-21 DIAGNOSIS — M5430 Sciatica, unspecified side: Secondary | ICD-10-CM | POA: Insufficient documentation

## 2012-11-21 DIAGNOSIS — E109 Type 1 diabetes mellitus without complications: Secondary | ICD-10-CM | POA: Insufficient documentation

## 2012-11-21 DIAGNOSIS — Z8669 Personal history of other diseases of the nervous system and sense organs: Secondary | ICD-10-CM | POA: Insufficient documentation

## 2012-11-21 DIAGNOSIS — E785 Hyperlipidemia, unspecified: Secondary | ICD-10-CM | POA: Insufficient documentation

## 2012-11-21 DIAGNOSIS — M25559 Pain in unspecified hip: Secondary | ICD-10-CM | POA: Insufficient documentation

## 2012-11-21 DIAGNOSIS — Z880 Allergy status to penicillin: Secondary | ICD-10-CM | POA: Insufficient documentation

## 2012-11-21 DIAGNOSIS — Z8739 Personal history of other diseases of the musculoskeletal system and connective tissue: Secondary | ICD-10-CM | POA: Insufficient documentation

## 2012-11-21 DIAGNOSIS — F0390 Unspecified dementia without behavioral disturbance: Secondary | ICD-10-CM | POA: Insufficient documentation

## 2012-11-21 DIAGNOSIS — F329 Major depressive disorder, single episode, unspecified: Secondary | ICD-10-CM | POA: Insufficient documentation

## 2012-11-21 DIAGNOSIS — Z87442 Personal history of urinary calculi: Secondary | ICD-10-CM | POA: Insufficient documentation

## 2012-11-21 DIAGNOSIS — Z87448 Personal history of other diseases of urinary system: Secondary | ICD-10-CM | POA: Insufficient documentation

## 2012-11-21 DIAGNOSIS — M25551 Pain in right hip: Secondary | ICD-10-CM

## 2012-11-21 DIAGNOSIS — Z87891 Personal history of nicotine dependence: Secondary | ICD-10-CM | POA: Insufficient documentation

## 2012-11-21 HISTORY — DX: Other chronic pain: G89.29

## 2012-11-21 HISTORY — DX: Sciatica, unspecified side: M54.30

## 2012-11-21 HISTORY — DX: Dorsalgia, unspecified: M54.9

## 2012-11-21 MED ORDER — HYDROCODONE-ACETAMINOPHEN 5-325 MG PO TABS
1.0000 | ORAL_TABLET | Freq: Once | ORAL | Status: DC
  Filled 2012-11-21: qty 1

## 2012-11-21 MED ORDER — DIAZEPAM 2 MG PO TABS
2.0000 mg | ORAL_TABLET | Freq: Once | ORAL | Status: DC
  Filled 2012-11-21: qty 1

## 2012-11-21 MED ORDER — METHOCARBAMOL 500 MG PO TABS: 500.0000 mg | ORAL_TABLET | Freq: Two times a day (BID) | ORAL | Status: DC | PRN

## 2012-11-21 MED ORDER — HYDROCODONE-ACETAMINOPHEN 5-325 MG PO TABS: ORAL_TABLET | ORAL | Status: DC

## 2012-11-21 NOTE — ED Notes (Signed)
Pt with right leg pain over several weeks

## 2012-11-21 NOTE — ED Provider Notes (Signed)
History     CSN: [id number]  Arrival date & time 11/21/12  1752   First MD Initiated Contact with Patient 11/21/12 1935      Chief Complaint  Patient presents with  . Hip Pain     HPI Pt was seen at [facility].   Per pt, c/o gradual onset and persistence of constant right hip "pain" for the past 2 weeks. Pt states the pain starts in her hip and radiates down her RLE. Pt also c/o gradual onset and persistence of constant acute flair of her chronic low back "pain" for the past several weeks.  Denies any change in her usual chronic pain pattern.  Pain worsens with palpation of the area and body position changes. Has been taking tramadol without relief. States she has been "favoring" her right low back and hip due to chronic left sided LBP and sciatica pain.  Denies incont/retention of bowel or bladder, no saddle anesthesia, no focal motor weakness, no tingling/numbness in extremities, no fevers, no injury, no abd pain. The patient has a significant history of similar symptoms previously, recently being evaluated for this complaint and multiple prior evals for same.     Past Medical History  Diagnosis Date  . Anxiety disorder   . Diabetes mellitus type 1   . Hyperlipidemia   . Hypertension   . Sleep apnea   . Disease of vein   . Coronary artery disease   . Complication of anesthesia     pt states she was not given enough medication with  tcs with Dr Tamala Julian                                    last [place].as able to feel and hear everything.  . Depression   . DEMENTIA   . Renal insufficiency   . Kidney stones   . DDD (degenerative disc disease), lumbar   . Chronic back pain   . Sciatica     Past Surgical History  Procedure Laterality Date  . Partial hysterectomy    . Lithotryspy and stent replacement rt ureter  10/2004    12/2009 Dr, Michela Pitcher   . Removal of lump from posterior r thigh    . Colonoscopy  08/21/06    adenomatous polyps/  . Cardiac catheterization  2005  . Flexible  sigmoidoscopy  09/11/2011    Procedure: FLEXIBLE SIGMOIDOSCOPY;  Surgeon: Danie Binder, MD;  Location: [facility];  Service: Endoscopy;  Laterality: N/A;  with propofol  . Extraction of left kidney stone    . Bilateral stone removal with nephrostomy at [facility]    . Abdominal hysterectomy      Family History  Problem Relation Age of Onset  . Hypertension Mother   . Diabetes Mother   . Heart failure Mother   . Dementia Mother   . Emphysema Father   . Hypertension Father   . Diabetes Brother   . GER disease Brother   . Cancer      family history   . Diabetes      family history   . Heart defect      famiily history   . Arthritis      family history   . Anesthesia problems Neg Hx   . Hypotension Neg Hx   . Malignant hyperthermia Neg Hx   . Pseudochol deficiency Neg Hx     History  Substance Use Topics  .  Smoking status: Former Smoker    Quit date: 09/21/1994  . Smokeless tobacco: Former Systems developer    Quit date: 09/04/1996  . Alcohol Use: No      Review of Systems ROS: Statement: All systems negative except as marked or noted in the HPI; Constitutional: Negative for fever and chills. ; ; Eyes: Negative for eye pain, redness and discharge. ; ; ENMT: Negative for ear pain, hoarseness, nasal congestion, sinus pressure and sore throat. ; ; Cardiovascular: Negative for chest pain, palpitations, diaphoresis, dyspnea and peripheral edema. ; ; Respiratory: Negative for cough, wheezing and stridor. ; ; Gastrointestinal: Negative for nausea, vomiting, diarrhea, abdominal pain, blood in stool, hematemesis, jaundice and rectal bleeding. . ; ; Genitourinary: Negative for dysuria, flank pain and hematuria. ; ; Musculoskeletal: +right hip pain, LBP. Negative for neck pain. Negative for swelling and trauma.; ; Skin: Negative for pruritus, rash, abrasions, blisters, bruising and skin lesion.; ; Neuro: Negative for headache, lightheadedness and neck stiffness. Negative for weakness, altered level  of consciousness , altered mental status, extremity weakness, paresthesias, involuntary movement, seizure and syncope.       Allergies  Ace inhibitors; Keflex; Nitrofurantoin; and Penicillins  Home Medications   Current Outpatient Rx  Name  Route  Sig  Dispense  Refill  . ALPRAZolam (XANAX) 0.5 MG tablet   Oral   Take 0.5 mg by mouth 2 (two) times daily.         Marland Kitchen amLODipine (NORVASC) 5 MG tablet   Oral   Take 1 tablet (5 mg total) by mouth daily.   30 tablet   4   . diphenhydramine-acetaminophen (PAIN RELIEF PM EXTRA STRENGTH) 25-500 MG TABS   Oral   Take 2 tablets by mouth at bedtime.         Marland Kitchen ibuprofen (ADVIL,MOTRIN) 800 MG tablet   Oral   Take 800 mg by mouth daily. For pain         . lovastatin (MEVACOR) 40 MG tablet   Oral   Take 80 mg by mouth at bedtime. Take two tablets at bedtime. Dose increase effective 02/14/2012         . metFORMIN (GLUCOPHAGE) 500 MG tablet   Oral   Take 2 tablets (1,000 mg total) by mouth 2 (two) times daily with a meal.   120 tablet   4   . PARoxetine (PAXIL) 40 MG tablet   Oral   Take 40 mg by mouth daily.         . potassium citrate (UROCIT-K) 10 MEQ (1080 MG) SR tablet   Oral   Take 10 mEq by mouth 3 (three) times daily with meals.         . traMADol (ULTRAM) 50 MG tablet   Oral   Take 50 mg by mouth 2 (two) times daily.         Marland Kitchen HYDROcodone-acetaminophen (NORCO/VICODIN) 5-325 MG per tablet      1 tab PO q6 hours prn pain   15 tablet   0   . methocarbamol (ROBAXIN) 500 MG tablet   Oral   Take 1 tablet (500 mg total) by mouth 2 (two) times daily as needed (muscle spasm).   10 tablet   0     BP 105/53  Pulse 114  Temp(Src) 98.3 F (36.8 C) (Oral)  Resp 18  Ht 5\' 7"  (1.702 m)  Wt 208 lb (94.348 kg)  BMI 32.57 kg/m2  SpO2 97%  Physical Exam 1950: Physical examination:  Nursing notes  reviewed; Vital signs and O2 SAT reviewed;  Constitutional: Well developed, Well nourished, Well hydrated, In  no acute distress; Head:  Normocephalic, atraumatic; Eyes: EOMI, PERRL, No scleral icterus; ENMT: Mouth and pharynx normal, Mucous membranes moist; Neck: Supple, Full range of motion, No lymphadenopathy; Cardiovascular: Regular rate and rhythm, No gallop; Respiratory: Breath sounds clear & equal bilaterally, No rales, rhonchi, wheezes.  Speaking full sentences with ease, Normal respiratory effort/excursion; Chest: Nontender, Movement normal; Abdomen: Soft, Nontender, Nondistended, Normal bowel sounds; Genitourinary: No CVA tenderness; Spine:  No midline CS, TS, LS tenderness. +TTP right lumbar paraspinal muscles.;; Extremities: Pulses normal, Pelvis stable. +TTP right anterior-lateral hip area at without palp masses. RLE muscles compartments soft. No deformity. No right knee/ankle/foot tenderness, No calf edema or asymmetry.; Neuro: AA&Ox3, Major CN grossly intact.  Speech clear. Strength 5/5 equal bilat UE's and LE's, including great toe dorsiflexion.  DTR 2/4 equal bilat UE's and LE's.  No gross sensory deficits.  Neg straight leg raises bilat. Climbs on and off stretcher easily by herself. Gait steady;; Skin: Color normal, Warm, Dry.   ED Course  Procedures     MDM  MDM Reviewed: previous chart, vitals and nursing note Reviewed previous: MRI, x-ray and CT scan Interpretation: x-ray   Dg Lumbar Spine Complete 11/21/2012   *RADIOLOGY REPORT*  Clinical Data: Low back pain, right hip pain  LUMBAR SPINE - COMPLETE 4+ VIEW  Comparison: None.  Findings: Five views of the lumbar spine submitted.  No acute fracture or subluxation.  Mild disc space flattening at L5 S1 level.  Alignment and vertebral heights are preserved.  IMPRESSION: No acute fracture or subluxation.  Mild disc space flattening at L5- S1 level.   Original Report Authenticated By: Lahoma Crocker, M.D.   Dg Hip Complete Right 11/21/2012   *RADIOLOGY REPORT*  Clinical Data: Low back pain, right hip pain  RIGHT HIP - COMPLETE 2+ VIEW  Comparison:  None.  Findings: Three views of the right hip submitted.  No acute fracture or subluxation.  No radiopaque foreign body.  IMPRESSION: No acute fracture or subluxation.   Original Report Authenticated By: Lahoma Crocker, M.D.     2100:  Pt can't find a ride and will be driving herself home; unable to dose meds here.  Pt has climbed off the stretcher by herself and gotten dressed; wants to go home now.  Known lumbar DDD; no red flags on exam to suggest cauda equina. Pt is ambulatory on right hip; doubt fx. No RLE edema; doubt DVT. No hx of AAA on previous CT scans; doubt same today. Will tx symptomatically at this time. Pt already taking NSAID. Dx and testing d/w pt.  Questions answered.  Verb understanding, agreeable to d/c home with outpt f/u.       Alfonzo Feller, DO 11/24/12 0109

## 2012-11-21 NOTE — ED Notes (Signed)
C/o pain from left hip radiating down right leg x 2 wks.  Denies injury.

## 2012-11-22 NOTE — Telephone Encounter (Signed)
Pt seen in ED 

## 2012-11-25 NOTE — Assessment & Plan Note (Signed)
Controlled, no change in medication Hyperlipidemia:Low fat diet discussed and encouraged.  \ 

## 2012-11-25 NOTE — Assessment & Plan Note (Signed)
Controlled, no change in medication DASH diet and commitment to daily physical activity for a minimum of 30 minutes discussed and encouraged, as a part of hypertension management. The importance of attaining a healthy weight is also discussed.  

## 2012-11-25 NOTE — Assessment & Plan Note (Signed)
Controlled, no change in medication Patient advised to reduce carb and sweets, commit to regular physical activity, take meds as prescribed, test blood as directed, and attempt to lose weight, to improve blood sugar control.  

## 2012-11-25 NOTE — Assessment & Plan Note (Signed)
Not suicidal or homicidal Improved, and pt will work on lifestyle change to address this issue

## 2012-11-25 NOTE — Assessment & Plan Note (Signed)
Controlled, no change in medication  

## 2012-11-25 NOTE — Assessment & Plan Note (Signed)
Deteriorated. Patient re-educated about  the importance of commitment to a  minimum of 150 minutes of exercise per week. The importance of healthy food choices with portion control discussed. Encouraged to start a food diary, count calories and to consider  joining a support group. Sample diet sheets offered. Goals set by the patient for the next several months.    

## 2012-12-22 ENCOUNTER — Other Ambulatory Visit: Payer: Self-pay

## 2012-12-22 ENCOUNTER — Other Ambulatory Visit: Payer: Self-pay | Admitting: Family Medicine

## 2012-12-22 MED ORDER — ALPRAZOLAM 0.5 MG PO TABS: 0.5000 mg | ORAL_TABLET | Freq: Two times a day (BID) | ORAL | Status: DC

## 2013-01-05 ENCOUNTER — Telehealth: Payer: Self-pay | Admitting: Family Medicine

## 2013-01-05 NOTE — Telephone Encounter (Signed)
Called and spoke with patient and she stated that the pharmacy corrected rx.  Will call and clarify rx with [facility].

## 2013-01-12 IMAGING — US US MISC SOFT TISSUE
1 series · 14 of 15 positions shown · non-contrast
Comparison: None.

CLINICAL DATA: History of an area of arm pain and swelling
involving an area of the left upper arm.

ULTRASOUND OF LEFT ARM SOFT TISSUES LIMITED
TECHNIQUE: Ultrasound examination of and area of the soft tissues
of the left arm was performed in the area of clinical concern.

[Series 1: us misc soft tissue · 0.08mm/px · 15 acquisitions, 14 frames shown]
[im 1/15]
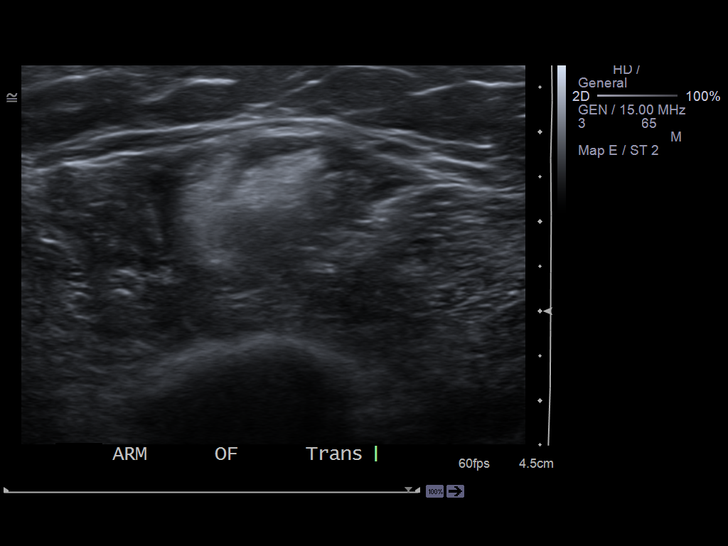
[im 2/15]
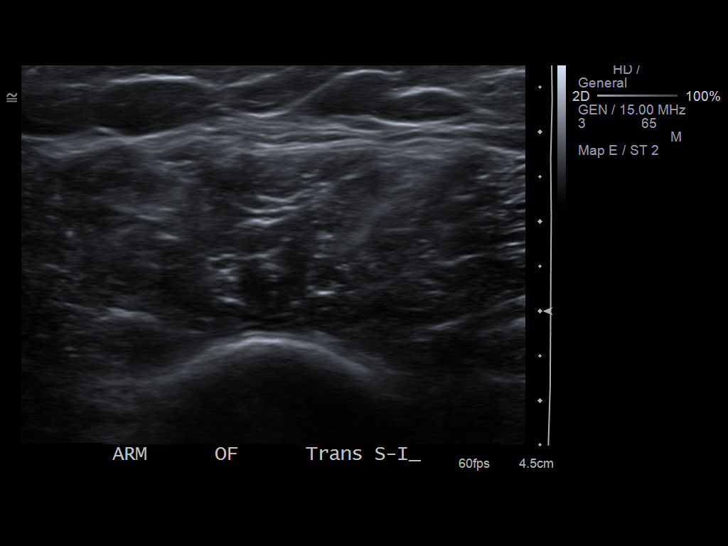
[im 3/15]
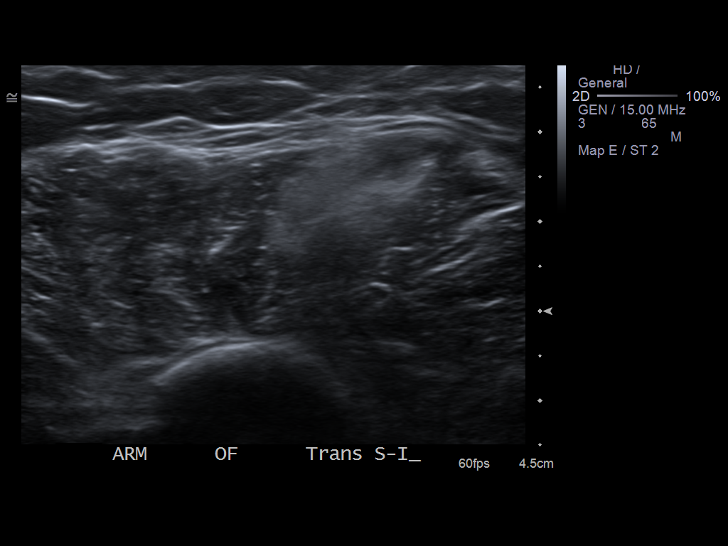
[im 4/15]
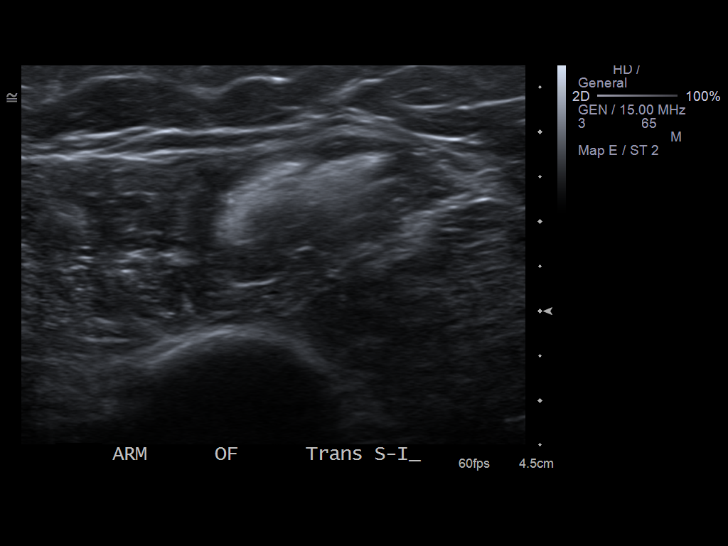
[im 5/15]
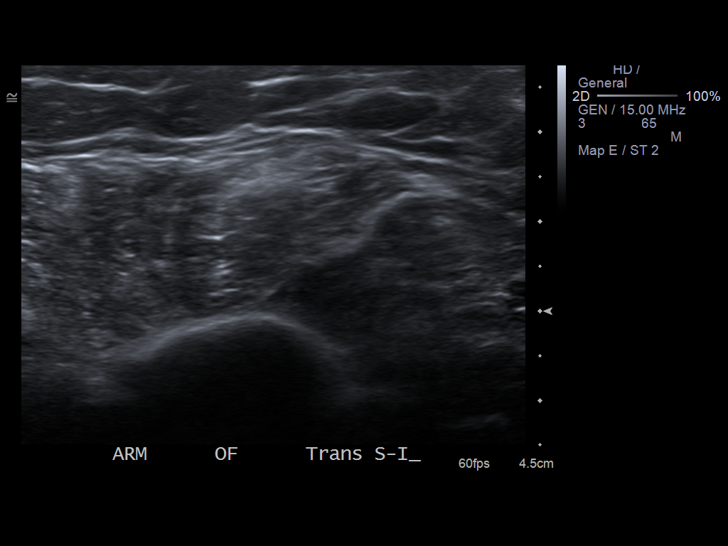
[im 6/15]
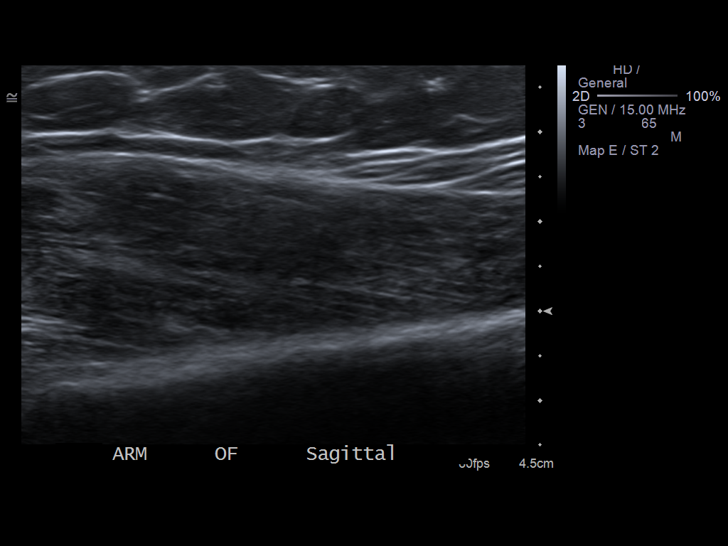
[im 7/15]
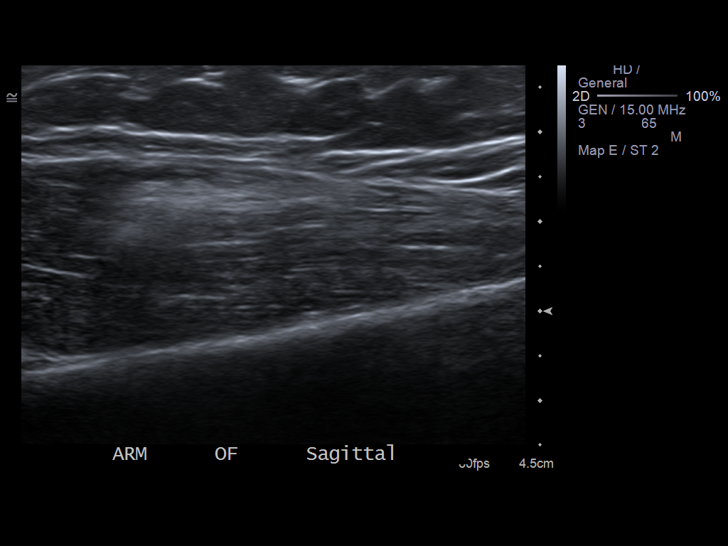
[im 9/15]
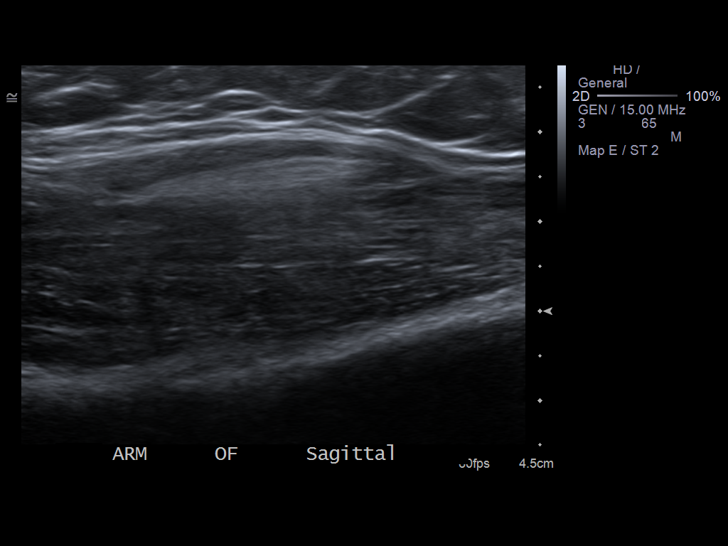
[im 10/15]
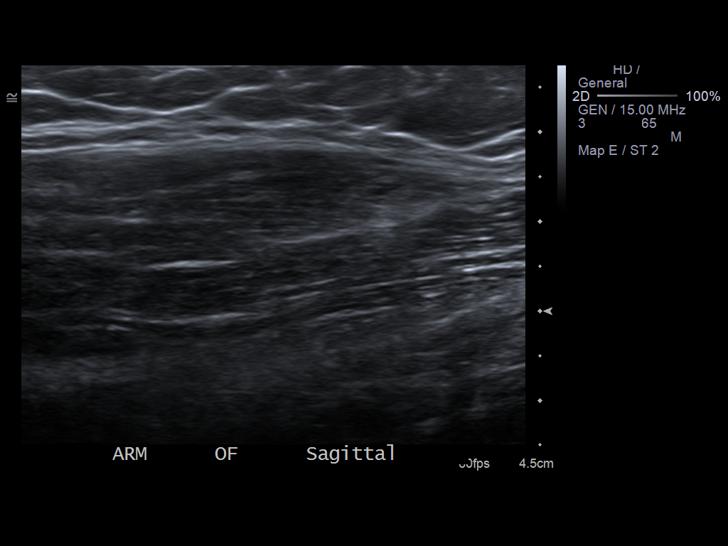
[im 11/15]
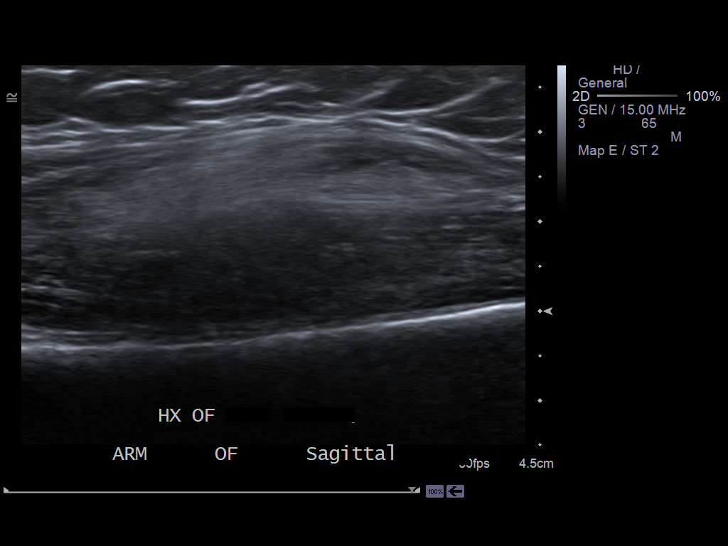
[im 12/15]
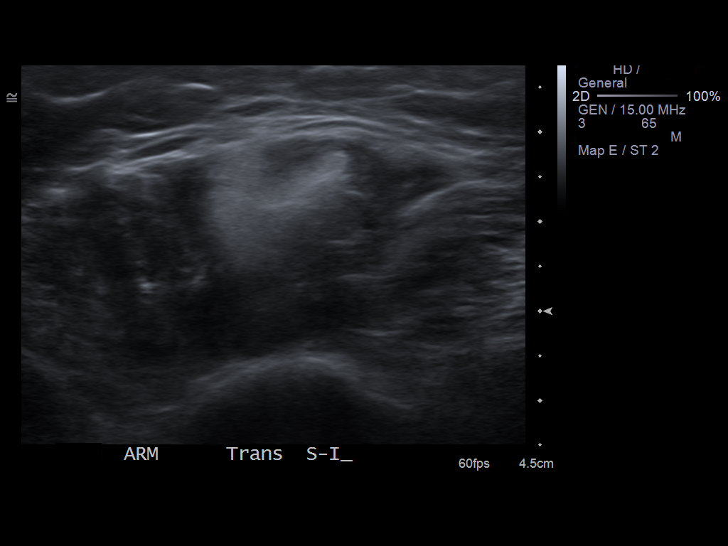
[im 13/15]
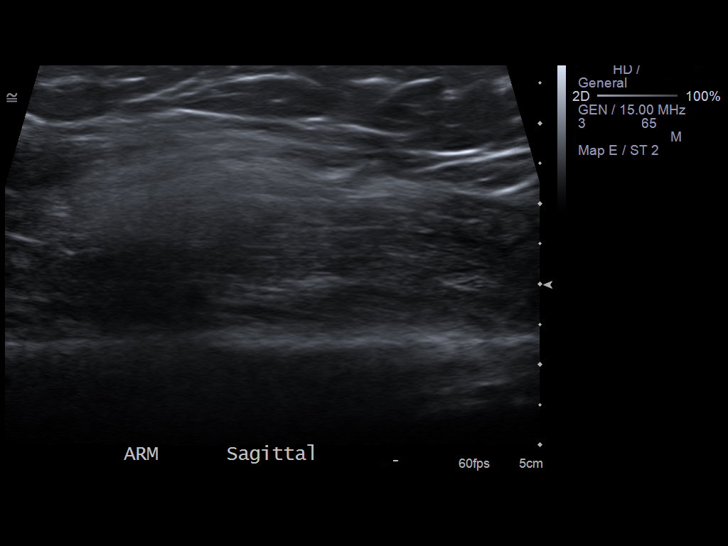
[im 14/15]
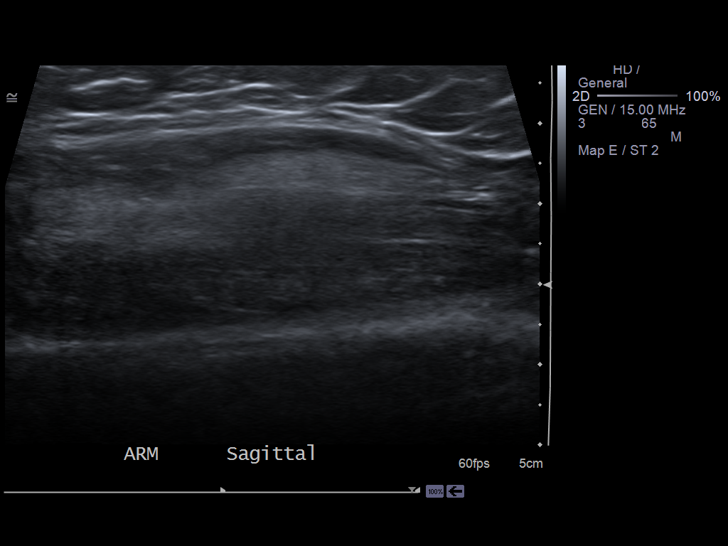
[im 15/15]
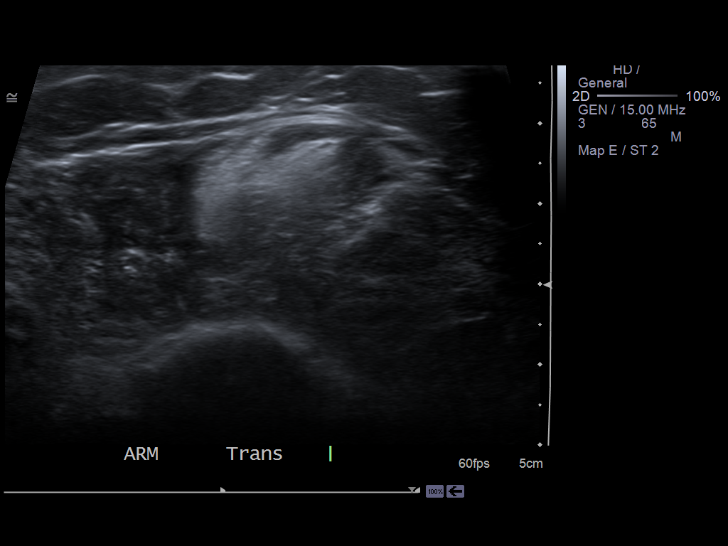

[14 of 15 positions shown; findings below may reference images not displayed]

FINDINGS: In the area of concern where there is an area of palpable
thickening there is an area of increased echogenicity with ill-
defined margins within the muscle.  This  appears to be in the area
of deltoid and biceps muscle junction.  No Doppler color
vascularity could be identified.  The patient gave history of
tendon tear.
IMPRESSION: Area of increased echogenicity with ill-defined margins within the
muscle of the upper arm on the left responding to area of palpable
possible thickening.  Differential diagnostic possibilities must
include area of injury, lipoma, or infiltrating process.  The
margins are slightly irregular.  Suggest consideration of MRI of
the arm with and without IV contrast for additional
characterization.

## 2013-01-12 IMAGING — US US MISC SOFT TISSUE
1 series · 10 of 10 positions shown · non-contrast
Comparison: None.

***ADDENDUM*** CREATED: 08/03/2010 [DATE]

The lymph node described in the report is in the area IIA.
***END ADDENDUM*** SIGNED BY: Xanaxmat Niftaliev
CLINICAL DATA: History of swelling in the left side of the neck and
posterior aspect of the neck.
ULTRASOUND OF HEAD/NECK SOFT TISSUES
TECHNIQUE: Ultrasound examination of the head and neck soft
tissues was performed in the area of clinical concern.

[Series 1: us misc soft tissue · 0.08mm/px · 10 of 10 slices shown]
[im 1/10]
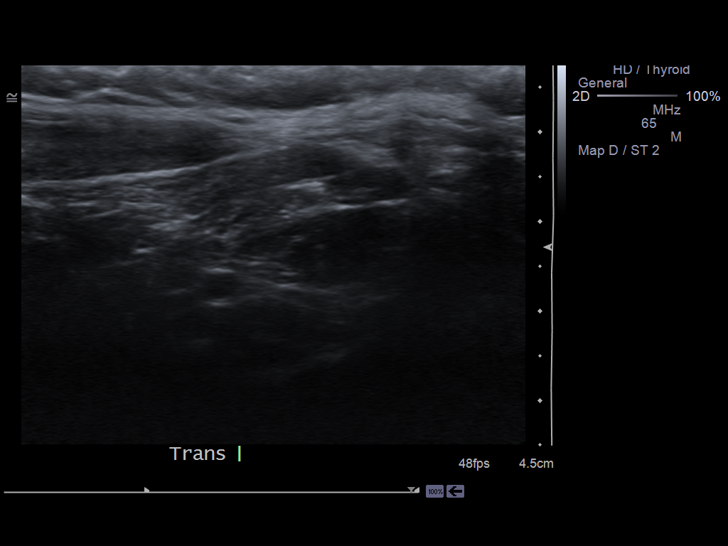
[im 2/10]
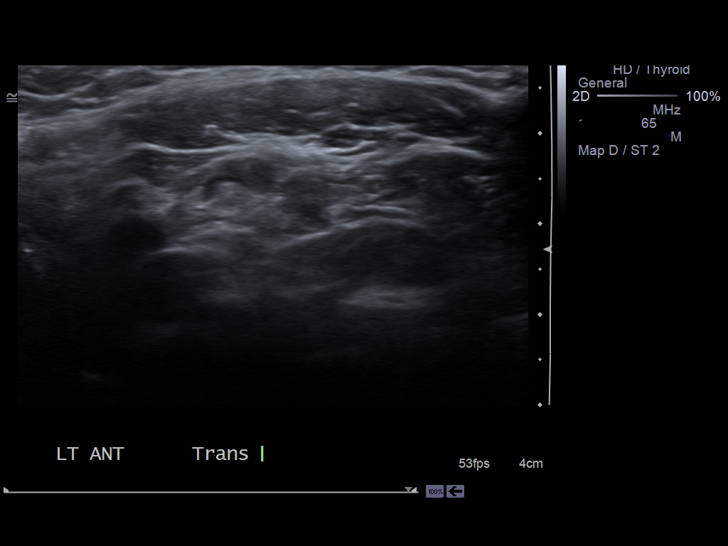
[im 3/10]
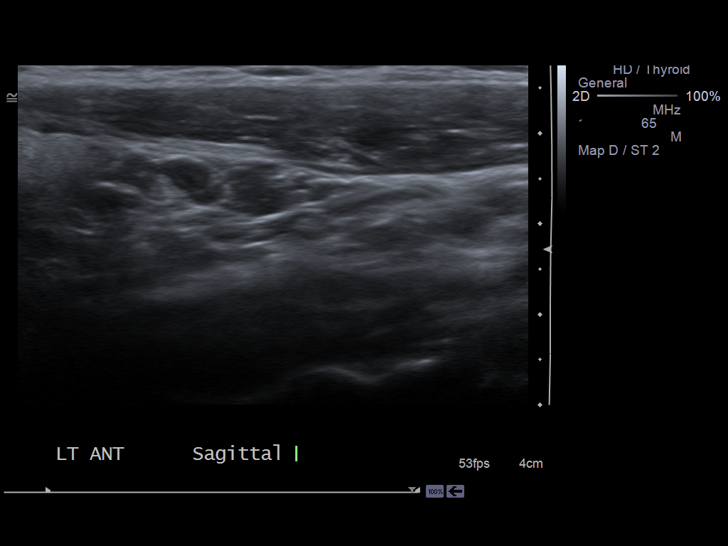
[im 4/10]
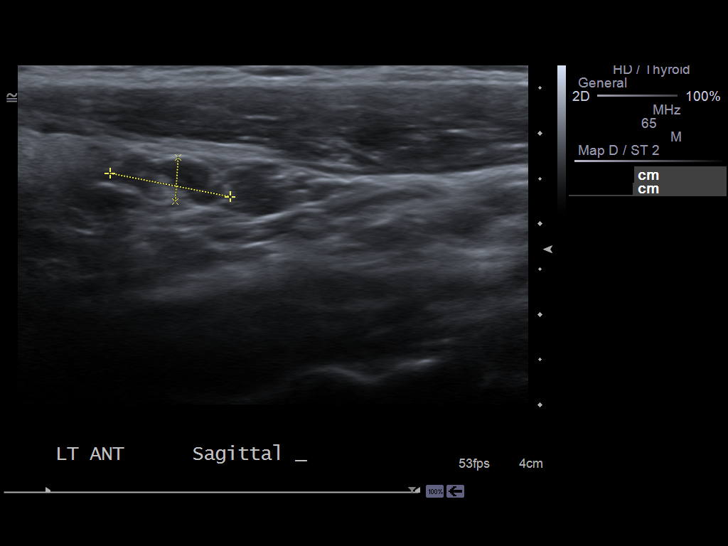
[im 5/10]
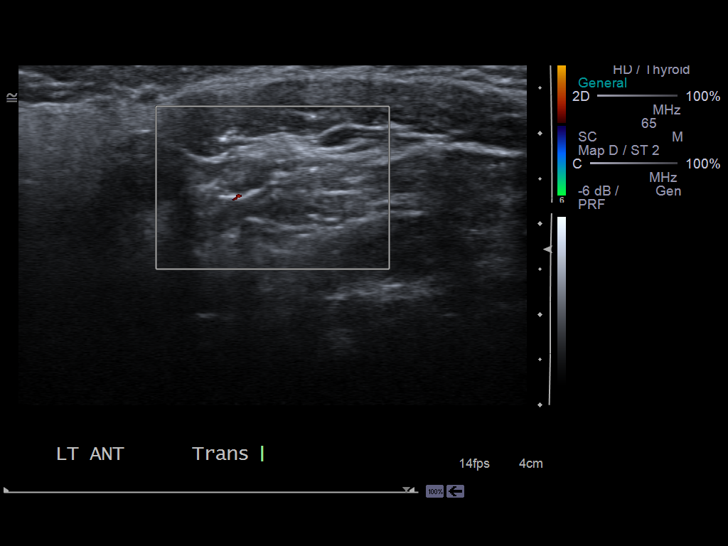
[im 6/10]
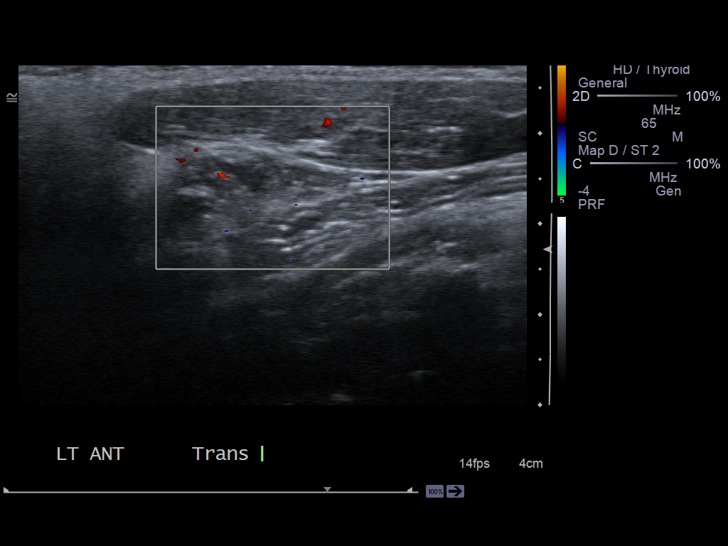
[im 7/10]
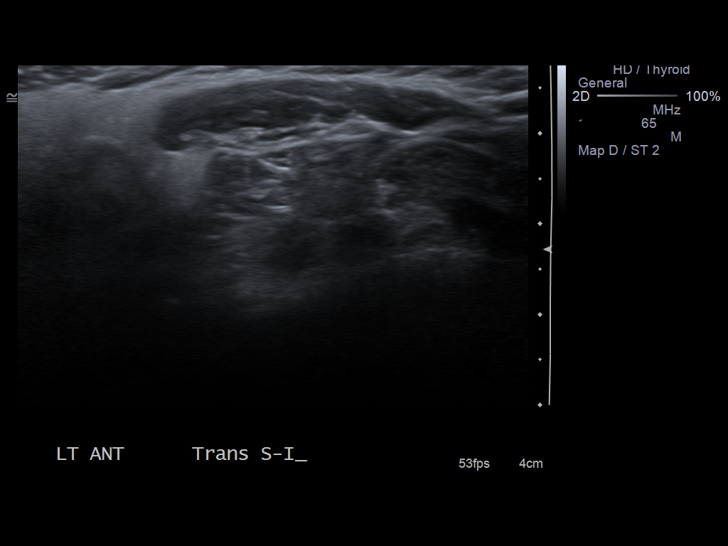
[im 8/10]
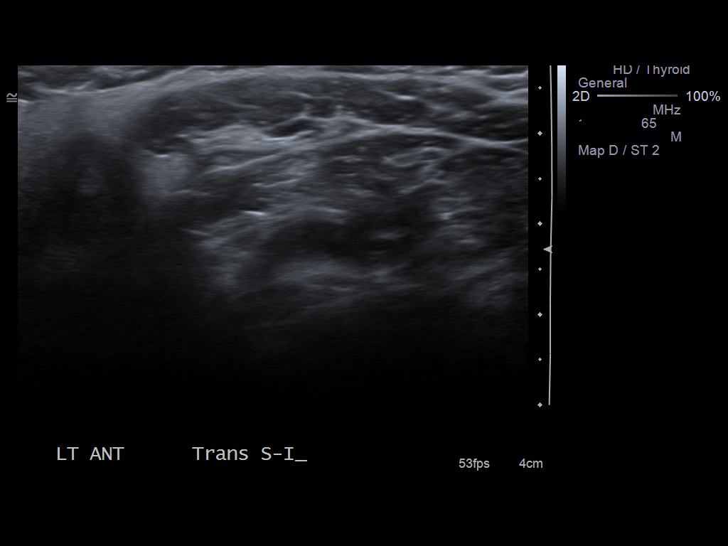
[im 9/10]
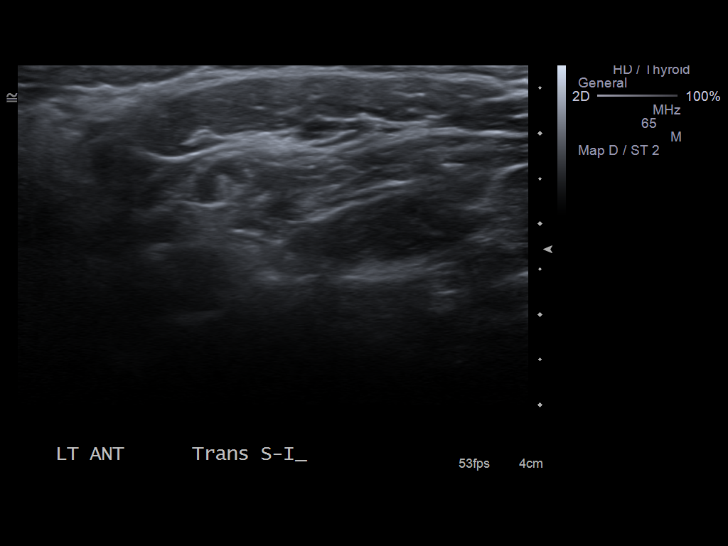
[im 10/10]
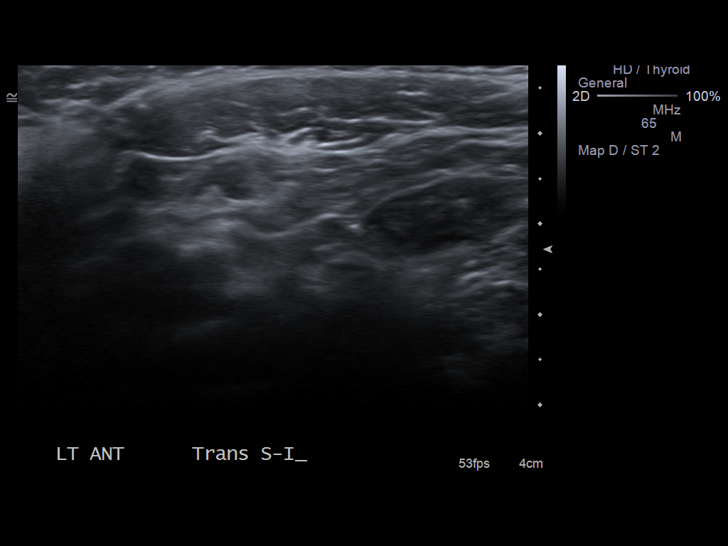

[10 of 10 positions shown; findings below may reference images not displayed]

FINDINGS: In the anterior aspect of the left side of the neck has
an oval structure having the appearance of a lymph node measuring
1.4 x 0.5 x 0.8 cm.  No solid mass is evident.  No abnormal fluid
collection is seen.  No abnormal vascularity is evident.  No other
lymph nodes could be discretely identified.
IMPRESSION: In the anterior aspect of the left side of the neck is an oval
structure having the appearance of a lymph node measuring 1.4 x
x 0.8 cm.

No solid mass, abnormal fluid collection, or abnormal Doppler
vascularity was seen.  No other lymph nodes identified.  If more
detailed evaluation of neck for adenopathy is needed CT of the neck
with contrast would be suggested to be considered.

## 2013-01-14 ENCOUNTER — Ambulatory Visit: Payer: Self-pay | Admitting: Nurse Practitioner

## 2013-01-15 ENCOUNTER — Other Ambulatory Visit: Payer: Self-pay | Admitting: Family Medicine

## 2013-01-16 ENCOUNTER — Telehealth: Payer: Self-pay | Admitting: Family Medicine

## 2013-01-16 NOTE — Telephone Encounter (Signed)
Med refilled 8/7 patient should contact pharmacy

## 2013-01-21 LAB — COMPLETE METABOLIC PANEL WITH GFR
ALT: 16 U/L (ref 0–35)
AST: 13 U/L (ref 0–37)
Albumin: 4.5 g/dL (ref 3.5–5.2)
Alkaline Phosphatase: 99 U/L (ref 39–117)
BUN: 17 mg/dL (ref 6–23)
CO2: 26 mEq/L (ref 19–32)
Calcium: 10.3 mg/dL (ref 8.4–10.5)
Chloride: 101 mEq/L (ref 96–112)
Creat: 1.19 mg/dL — ABNORMAL HIGH (ref 0.50–1.10)
GFR, Est African American: 54 mL/min — ABNORMAL LOW
GFR, Est Non African American: 47 mL/min — ABNORMAL LOW
Glucose, Bld: 94 mg/dL (ref 70–99)
Potassium: 4.9 mEq/L (ref 3.5–5.3)
Sodium: 136 mEq/L (ref 135–145)
Total Bilirubin: 0.3 mg/dL (ref 0.3–1.2)
Total Protein: 8.3 g/dL (ref 6.0–8.3)

## 2013-01-21 LAB — LIPID PANEL
Cholesterol: 150 mg/dL (ref 0–200)
HDL: 41 mg/dL (ref >39)
LDL Cholesterol: 75 mg/dL (ref 0–99)
Total CHOL/HDL Ratio: 3.7 Ratio
Triglycerides: 169 mg/dL — ABNORMAL HIGH (ref <150)
VLDL: 34 mg/dL (ref 0–40)

## 2013-01-21 LAB — HEMOGLOBIN A1C
Hgb A1c MFr Bld: 6.4 % — ABNORMAL HIGH (ref <5.7)
Mean Plasma Glucose: 137 mg/dL — ABNORMAL HIGH (ref <117)

## 2013-01-24 IMAGING — MR MR SHOULDER*L* WO/W CM
9 series · 40 of 40 positions shown · IV contrast (multihance)
Comparison: Soft tissue ultrasound 08/03/2010.

CLINICAL DATA: Palpable and painful lesion in the left shoulder
for 1 month.

MRI LEFT SHOULDER WITH AND WITHOUT CONTRAST
TECHNIQUE: Multiplanar, multisequence MR imaging of the left
shoulder was performed before and after the administration of
intravenous contrast.
Contrast: 19 ml Multihance IV.

[Series 3: T1 · axial · 4.0mm · 0.56mm/px · z∈[-40,+52]mm · 5 of 22 slices shown (1 of 2)]
[im 1/22]
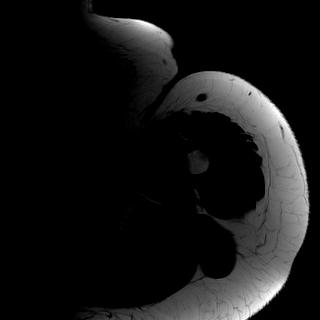
[im 6/22]
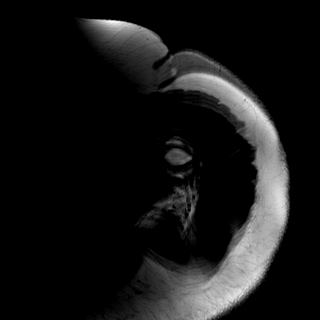
[im 11/22]
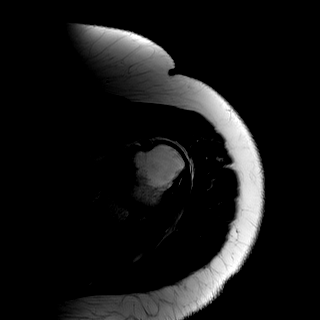
[im 16/22]
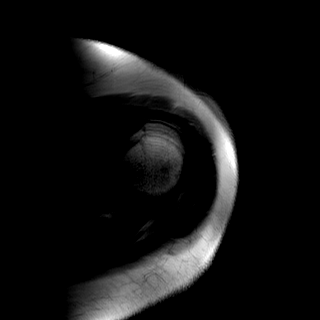
[im 22/22]
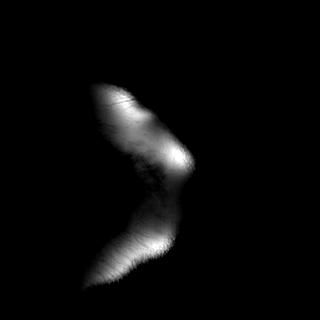

[Series 4: t2fs axial · axial · 4.0mm · 0.28mm/px · z∈[-40,+52]mm · 4 of 22 slices shown]
[im 1/22]
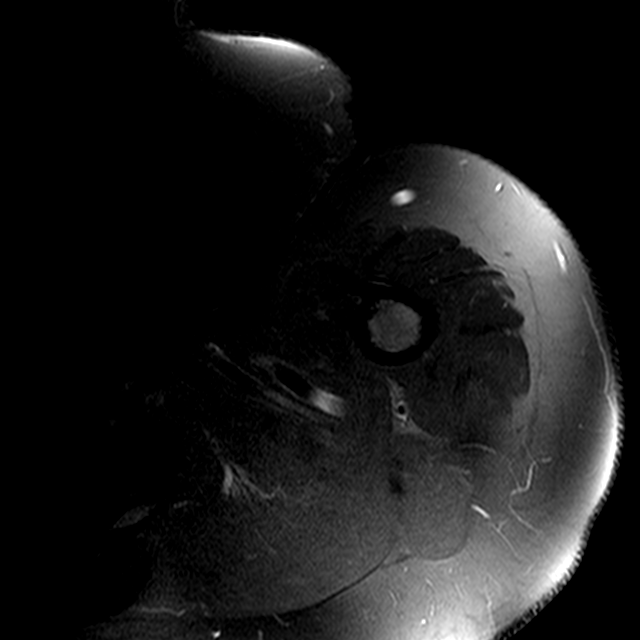
[im 8/22]
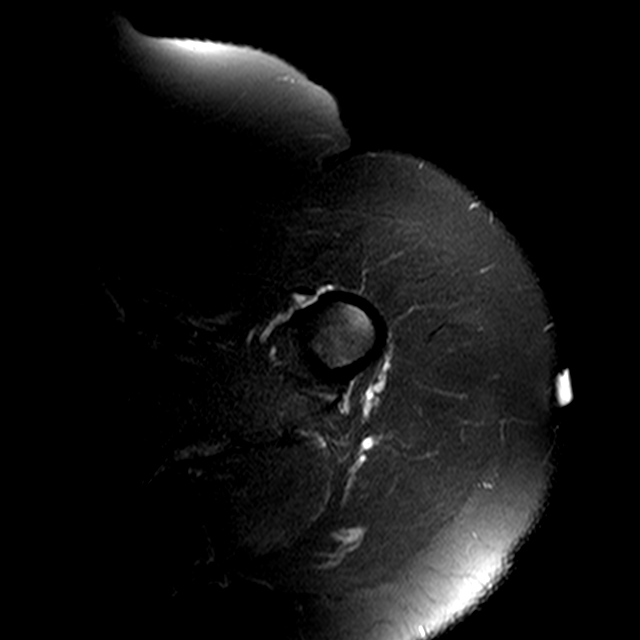
[im 15/22]
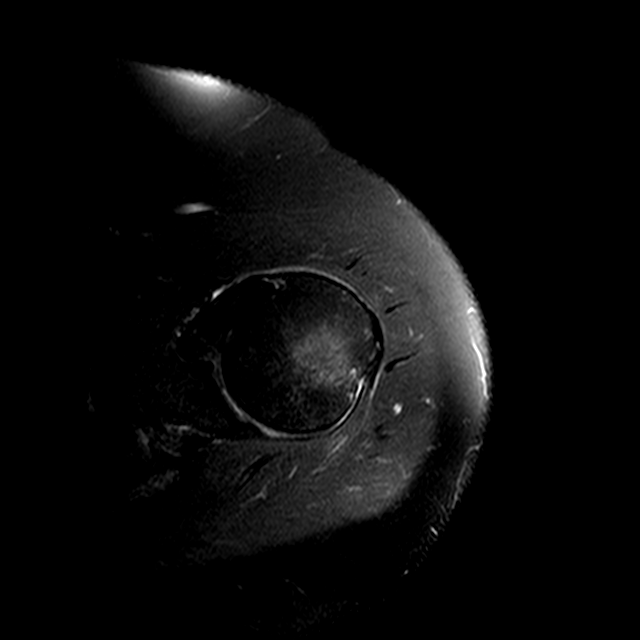
[im 22/22]
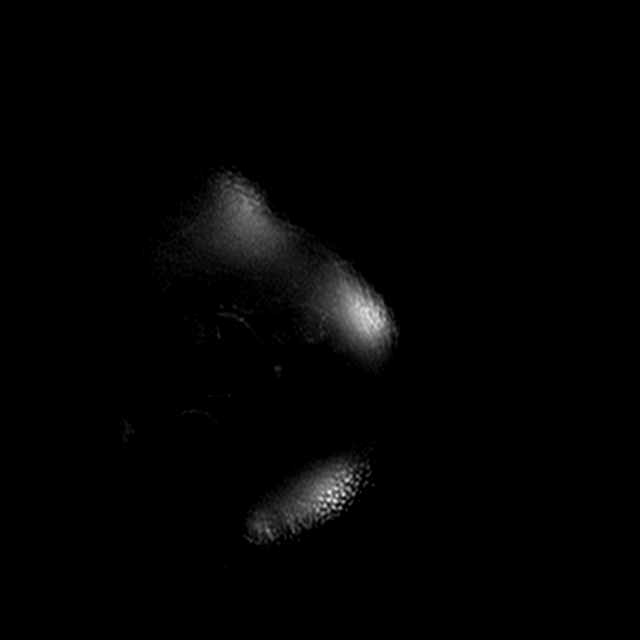

[Series 5: t1fs axial · axial · 4.0mm · 0.70mm/px · z∈[-40,+52]mm · 4 of 22 slices shown (1 of 2)]
[im 1/22]
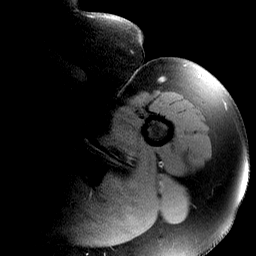
[im 8/22]
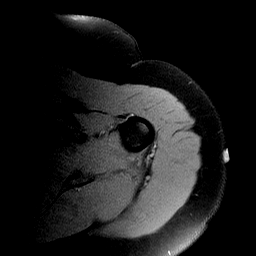
[im 15/22]
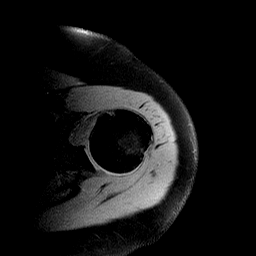
[im 22/22]
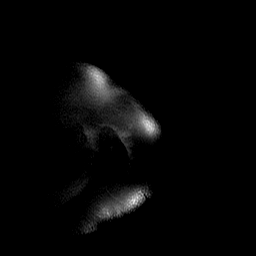

[Series 6: T1 · oblique · 5.0mm · 0.56mm/px · 5 of 24 slices shown (2 of 2)]
[im 1/24]
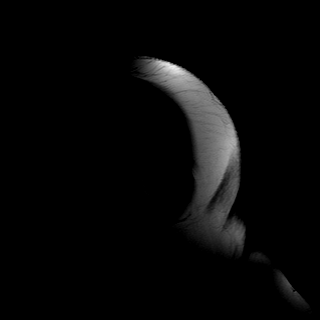
[im 6/24]
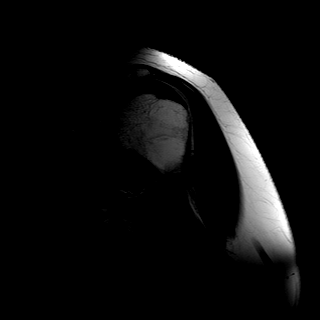
[im 12/24]
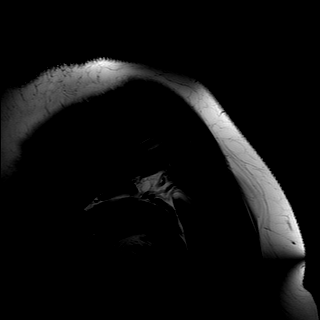
[im 18/24]
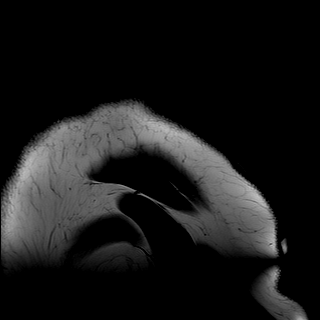
[im 24/24]
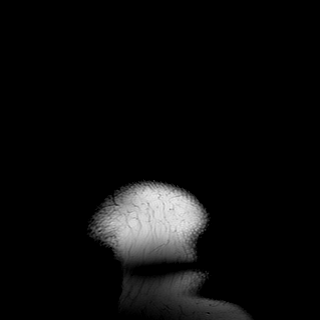

[Series 7: t2fs coronal · oblique · 5.0mm · 0.56mm/px · 5 of 24 slices shown]
[im 1/24]
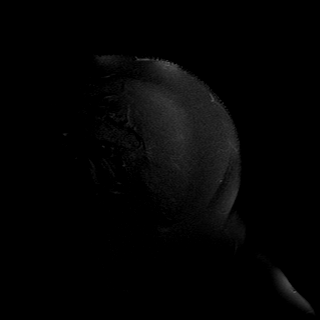
[im 6/24]
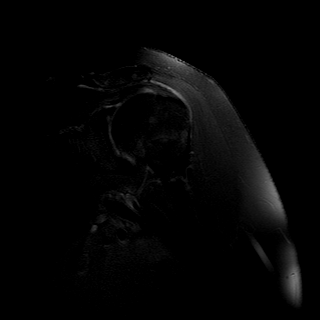
[im 12/24]
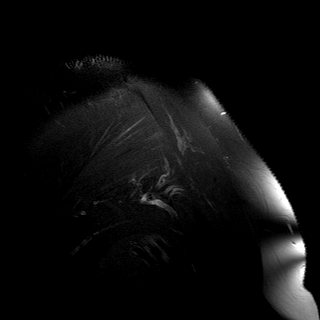
[im 18/24]
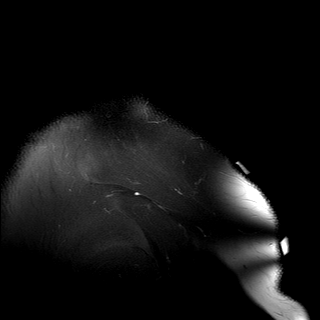
[im 24/24]
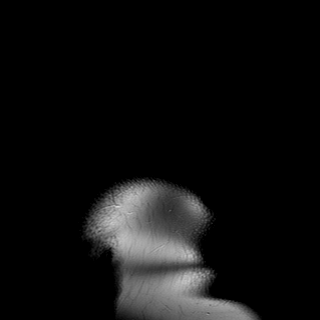

[Series 8: t2fs sagital · oblique · 5.0mm · 0.56mm/px · 3 of 17 slices shown]
[im 1/17]
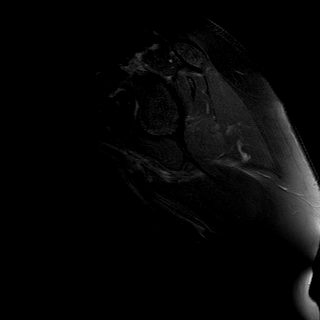
[im 9/17]
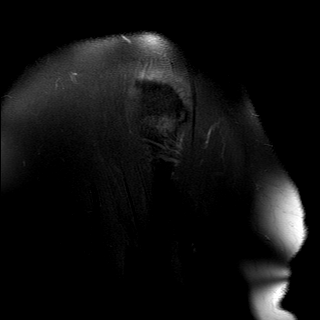
[im 17/17]
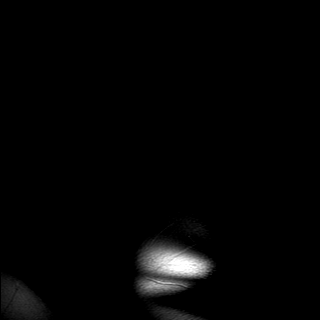

[Series 9: ir coronal · oblique · 5.0mm · 0.70mm/px · 5 of 24 slices shown]
[im 1/24]
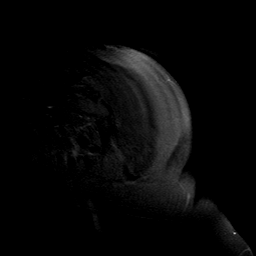
[im 6/24]
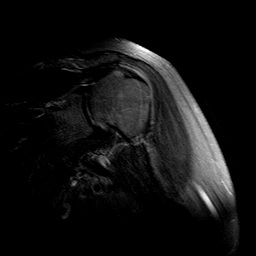
[im 12/24]
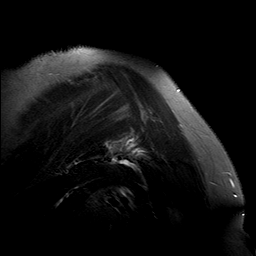
[im 18/24]
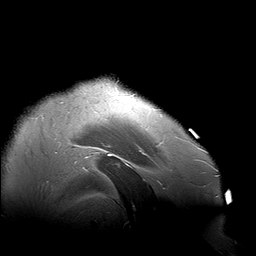
[im 24/24]
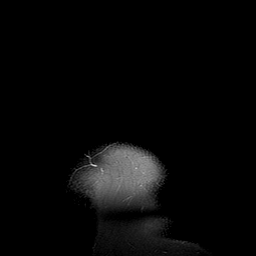

[Series 10: t1fs axial · axial · 4.0mm · 0.70mm/px · z∈[-40,+52]mm · 4 of 22 slices shown (2 of 2)]
[im 1/22]
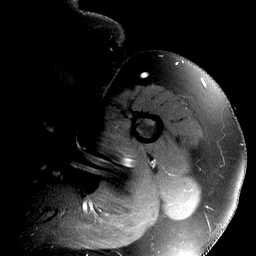
[im 8/22]
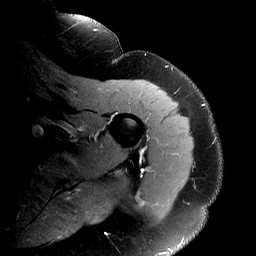
[im 15/22]
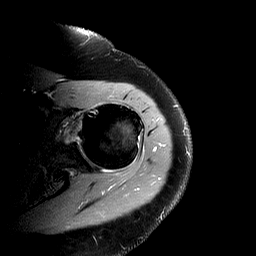
[im 22/22]
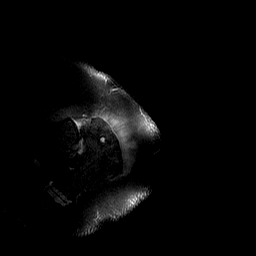

[Series 11: t1fs coronal · oblique · 5.0mm · 0.56mm/px · 5 of 24 slices shown]
[im 1/24]
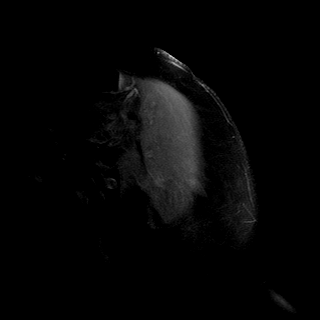
[im 6/24]
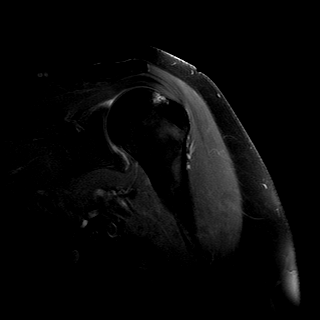
[im 12/24]
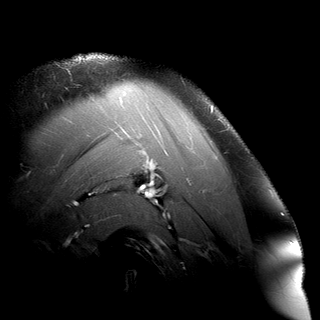
[im 18/24]
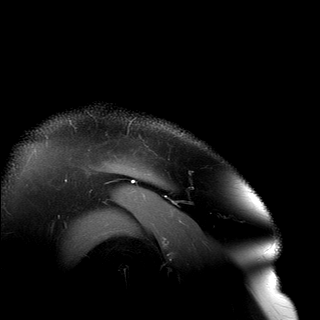
[im 24/24]
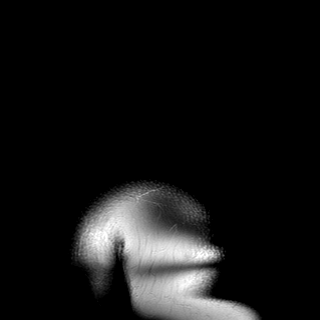

[40 of 40 positions shown; findings below may reference images not displayed]

FINDINGS: Markers are placed about the region of concern over the
left deltoid muscle.  No underlying mass or focal fluid collection
is identified.  Subcutaneous fatty and muscular tissues appear
normal.  No worrisome bone lesion is identified.  There is some
cystic change in the greater tuberosity at the rotator cuff
insertion compatible with degenerative change. The patient has some
rotator cuff tendinopathy without tear.  Labrum appears intact.
There is acromioclavicular degenerative disease.  Fluid is present
in the subacromial/subdeltoid bursa.
IMPRESSION: 1.  Negative for mass.  No abnormality is identified in the region
concern.
2.  Supraspinatus and infraspinatus tendinopathy without tear.
3.  Acromioclavicular degenerative disease.
4.  Subacromial/subdeltoid bursitis.

## 2013-02-02 ENCOUNTER — Ambulatory Visit (INDEPENDENT_AMBULATORY_CARE_PROVIDER_SITE_OTHER): Payer: Medicare Other | Admitting: Family Medicine

## 2013-02-02 ENCOUNTER — Encounter: Payer: Self-pay | Admitting: Family Medicine

## 2013-02-02 ENCOUNTER — Ambulatory Visit (HOSPITAL_COMMUNITY)
Admission: RE | Admit: 2013-02-02 | Discharge: 2013-02-02 | Disposition: A | Discharge status: Home / Self Care | Payer: Medicare Other | Source: Ambulatory Visit | Attending: Family Medicine | Admitting: Family Medicine

## 2013-02-02 VITALS — BP 122/78 | HR 100 | Resp 18 | Ht 67.0 in | Wt 207.0 lb

## 2013-02-02 DIAGNOSIS — F32A Depression, unspecified: Secondary | ICD-10-CM

## 2013-02-02 DIAGNOSIS — E1121 Type 2 diabetes mellitus with diabetic nephropathy: Secondary | ICD-10-CM

## 2013-02-02 DIAGNOSIS — R1310 Dysphagia, unspecified: Secondary | ICD-10-CM

## 2013-02-02 DIAGNOSIS — M25511 Pain in right shoulder: Secondary | ICD-10-CM

## 2013-02-02 DIAGNOSIS — M25519 Pain in unspecified shoulder: Secondary | ICD-10-CM | POA: Insufficient documentation

## 2013-02-02 DIAGNOSIS — N058 Unspecified nephritic syndrome with other morphologic changes: Secondary | ICD-10-CM

## 2013-02-02 DIAGNOSIS — E1129 Type 2 diabetes mellitus with other diabetic kidney complication: Secondary | ICD-10-CM

## 2013-02-02 DIAGNOSIS — E785 Hyperlipidemia, unspecified: Secondary | ICD-10-CM

## 2013-02-02 DIAGNOSIS — I10 Essential (primary) hypertension: Secondary | ICD-10-CM

## 2013-02-02 DIAGNOSIS — M19019 Primary osteoarthritis, unspecified shoulder: Secondary | ICD-10-CM | POA: Insufficient documentation

## 2013-02-02 DIAGNOSIS — F3289 Other specified depressive episodes: Secondary | ICD-10-CM

## 2013-02-02 DIAGNOSIS — K219 Gastro-esophageal reflux disease without esophagitis: Secondary | ICD-10-CM

## 2013-02-02 DIAGNOSIS — E669 Obesity, unspecified: Secondary | ICD-10-CM

## 2013-02-02 DIAGNOSIS — F329 Major depressive disorder, single episode, unspecified: Secondary | ICD-10-CM

## 2013-02-02 DIAGNOSIS — G8929 Other chronic pain: Secondary | ICD-10-CM | POA: Insufficient documentation

## 2013-02-02 MED ORDER — LOVASTATIN 40 MG PO TABS: 80.0000 mg | ORAL_TABLET | Freq: Every day | ORAL | Status: DC

## 2013-02-02 MED ORDER — IBUPROFEN 800 MG PO TABS: 800.0000 mg | ORAL_TABLET | Freq: Every day | ORAL | Status: DC

## 2013-02-02 MED ORDER — AMLODIPINE BESYLATE 5 MG PO TABS: 5.0000 mg | ORAL_TABLET | Freq: Every day | ORAL | Status: DC

## 2013-02-02 MED ORDER — PANTOPRAZOLE SODIUM 40 MG PO TBEC: DELAYED_RELEASE_TABLET | ORAL | Status: DC

## 2013-02-02 MED ORDER — PAROXETINE HCL 40 MG PO TABS: 40.0000 mg | ORAL_TABLET | Freq: Every day | ORAL | Status: DC

## 2013-02-02 NOTE — Patient Instructions (Addendum)
Annual wellness 2nd week in December, pls cancel November appt.  Call in October for flu vaccine    You have severe reflux, pls change all drinks to decaffeinated, no chocolate  Start protonix twice daily to help with symptoms , and you are referred to Dr Oneida Alar to further evaluate and treat  You need to get the xray of your right shoulder today,and you are referred to Dr Aline Brochure  Blood sugar is good, need to cut back on cheese, egg yolk, and butter and red meat   CBC, HBA1C, and chem 7 in December non fasting before visit

## 2013-02-02 NOTE — Progress Notes (Signed)
  Subjective:    Patient ID: Cheryl Abbott, female    DOB: 08-30-1945, 67 y.o.   MRN: [id number]  HPI The PT is here for follow up and re-evaluation of chronic medical conditions, medication management and review of any available recent lab and radiology data.  Preventive health is updated, specifically  Cancer screening and Immunization.    The PT denies any adverse reactions to current medications since the last visit.  2 week h/o increased symptoms of uncontrolled reflux with liquid dysphagia at times. Bowel movements unchanged soft , still 3 times per day. Right shoulder pain increased with reduced mobility x 1 month, has benefited in the past from ortho so will refer, also had PT Still limits activities to [facility] and medical appts but she is content Denies polyuria, polydipsia or blurred vision    Review of Systems See HPI Denies recent fever or chills. Denies sinus pressure, nasal congestion, ear pain or sore throat. Denies chest congestion, productive cough or wheezing. Denies chest pains, palpitations and leg swelling  Denies dysuria, frequency, hesitancy or incontinence.  Denies headaches, seizures, numbness, or tingling. Denies depression, anxiety or insomnia. Denies skin break down or rash.        Objective:   Physical Exam  Patient alert and oriented and in no cardiopulmonary distress.  HEENT: No facial asymmetry, EOMI, no sinus tenderness,  oropharynx pink and moist.  Neck supple no adenopathy.  Chest: Clear to auscultation bilaterally.  CVS: S1, S2 no murmurs, no S3.  ABD: Soft non tender. Bowel sounds normal.  Ext: No edema  MS: Adequate ROM spine,  hips and knees.Decreased ROM right shoulder  Skin: Intact, no ulcerations or rash noted.  Psych: Good eye contact, normal though blunted affect. Memory intact not anxious or depressed appearing.  CNS: CN 2-12 intact, power, tone and sensation normal throughout.       Assessment & Plan:

## 2013-02-02 NOTE — Assessment & Plan Note (Addendum)
Uncontrolled, with dysphagia, start PPI and refer to GI as she has  dysphagia also symptoms of uncontrolled reflux with acid coming up in throat and mouth

## 2013-02-02 NOTE — Assessment & Plan Note (Signed)
Non compliant with machine , unable to tolerate

## 2013-02-03 ENCOUNTER — Telehealth: Payer: Self-pay | Admitting: Family Medicine

## 2013-02-03 ENCOUNTER — Encounter: Payer: Self-pay | Admitting: Gastroenterology

## 2013-02-03 ENCOUNTER — Other Ambulatory Visit: Payer: Self-pay | Admitting: Family Medicine

## 2013-02-03 NOTE — Assessment & Plan Note (Signed)
Controlled, no change in medication DASH diet and commitment to daily physical activity for a minimum of 30 minutes discussed and encouraged, as a part of hypertension management. The importance of attaining a healthy weight is also discussed.  

## 2013-02-03 NOTE — Assessment & Plan Note (Signed)
Triglycerides elevated otherwise well controlled. Dietary modification, no med change

## 2013-02-03 NOTE — Telephone Encounter (Signed)
rx faxed to [facility], patient aware

## 2013-02-03 NOTE — Assessment & Plan Note (Signed)
Controlled, no change in medication Patient advised to reduce carb and sweets, commit to regular physical activity, take meds as prescribed, test blood as directed, and attempt to lose weight, to improve blood sugar control.  

## 2013-02-03 NOTE — Assessment & Plan Note (Signed)
Improved, not suicidal or homicidal, continue current med

## 2013-02-04 ENCOUNTER — Telehealth: Payer: Self-pay | Admitting: Family Medicine

## 2013-02-04 NOTE — Telephone Encounter (Signed)
Called patient and left message for them to return call at the office   

## 2013-02-05 NOTE — Telephone Encounter (Signed)
pls spk with Enid Derry Davia document her concern

## 2013-02-05 NOTE — Telephone Encounter (Signed)
Patient just wanted to know the results of her test. Patient aware

## 2013-02-07 ENCOUNTER — Other Ambulatory Visit: Payer: Self-pay | Admitting: Family Medicine

## 2013-02-10 ENCOUNTER — Other Ambulatory Visit: Payer: Self-pay

## 2013-02-10 MED ORDER — TRAMADOL HCL 50 MG PO TABS: ORAL_TABLET | ORAL | Status: DC

## 2013-02-27 ENCOUNTER — Other Ambulatory Visit: Payer: Self-pay | Admitting: Family Medicine

## 2013-02-27 DIAGNOSIS — Z139 Encounter for screening, unspecified: Secondary | ICD-10-CM

## 2013-03-03 ENCOUNTER — Encounter: Payer: Self-pay | Admitting: Gastroenterology

## 2013-03-04 ENCOUNTER — Encounter (HOSPITAL_COMMUNITY): Payer: Self-pay

## 2013-03-04 ENCOUNTER — Other Ambulatory Visit: Payer: Self-pay | Admitting: Gastroenterology

## 2013-03-04 ENCOUNTER — Encounter: Payer: Self-pay | Admitting: Gastroenterology

## 2013-03-04 ENCOUNTER — Ambulatory Visit (INDEPENDENT_AMBULATORY_CARE_PROVIDER_SITE_OTHER): Payer: Medicare Other | Admitting: Gastroenterology

## 2013-03-04 VITALS — BP 115/77 | HR 105 | Temp 97.9°F | Ht 66.0 in | Wt 206.0 lb

## 2013-03-04 DIAGNOSIS — K219 Gastro-esophageal reflux disease without esophagitis: Secondary | ICD-10-CM

## 2013-03-04 DIAGNOSIS — R1310 Dysphagia, unspecified: Secondary | ICD-10-CM | POA: Insufficient documentation

## 2013-03-04 MED ORDER — PANTOPRAZOLE SODIUM 40 MG PO TBEC: 40.0000 mg | DELAYED_RELEASE_TABLET | Freq: Two times a day (BID) | ORAL | Status: DC

## 2013-03-04 NOTE — Progress Notes (Signed)
Referring Provider: [name], MD Primary Care Physician:  Tula Nakayama, MD Primary GI: Dr. Oneida Alar   Chief Complaint  Patient presents with  . Gastrophageal Reflux  . Dysphagia    HPI:   Cheryl Abbott presents today at the request of Dr. Moshe Cipro secondary to worsening GERD and dysphagia. Last TCS in April 2013 with colonic diverticulosis.   Intermittent nocturnal regurgitation, nausea, can't catch her breath good when it happens. Not only at night. Symptoms for a good while. Notes lack of appetite, early satiety. Nausea intermittent. No abdominal pain. Gets "strangled" sometimes with food. Doesn't want to go down. Remote EGD many years ago, Dr. Tamala Julian. Weight stable. States Protonix BID didn't help with reflux symptoms but did help with the nausea. Failed Prilosec and Nexium. No melena. Stomach is growling a lot. Stool looks like a pumpkin. States back hurts from bra area all the way down. Prevents her from housework at times. Worse with exertion.   Past Medical History  Diagnosis Date  . Anxiety disorder   . Diabetes mellitus type 1   . Hyperlipidemia   . Hypertension   . Sleep apnea   . Disease of vein   . Coronary artery disease   . Complication of anesthesia     pt states she was not given enough medication with  tcs with Dr Tamala Julian                                    last [place].as able to feel and hear everything.  . Depression   . DEMENTIA   . Renal insufficiency   . Kidney stones   . DDD (degenerative disc disease), lumbar   . Chronic back pain   . Sciatica     Past Surgical History  Procedure Laterality Date  . Partial hysterectomy    . Lithotryspy and stent replacement rt ureter  10/2004    12/2009 Dr, Michela Pitcher   . Removal of lump from posterior r thigh    . Colonoscopy  08/21/06    adenomatous polyps/  . Cardiac catheterization  2005  . Flexible sigmoidoscopy  09/11/2011    [id number] Internal hemorrhoids  . Extraction of left kidney stone    .  Bilateral stone removal with nephrostomy at [facility]    . Colonoscopy  10/02/2011    [id number] SIGMOID COLON DIVERTICULOSIS/hemorrhoids    Current Outpatient Prescriptions  Medication Sig Dispense Refill  . ALPRAZolam (XANAX) 0.5 MG tablet TAKE 1 TABLET BY MOUTH TWICE A DAY  60 tablet  3  . amLODipine (NORVASC) 5 MG tablet Take 1 tablet (5 mg total) by mouth daily.  30 tablet  4  . ibuprofen (ADVIL,MOTRIN) 800 MG tablet Take 1 tablet (800 mg total) by mouth daily. For pain  30 tablet  3  . lovastatin (MEVACOR) 40 MG tablet Take 2 tablets (80 mg total) by mouth at bedtime. Take two tablets at bedtime. Dose increase effective 02/14/2012  60 tablet  4  . metFORMIN (GLUCOPHAGE) 500 MG tablet TAKE 2 TABLETS (1,000 MG TOTAL) BY MOUTH 2 (TWO) TIMES DAILY WITH A MEAL.  120 tablet  3  . PARoxetine (PAXIL) 40 MG tablet Take 1 tablet (40 mg total) by mouth daily.  30 tablet  4  . potassium citrate (UROCIT-K) 10 MEQ (1080 MG) SR tablet Take 10 mEq by mouth 3 (three) times daily with meals.      Marland Kitchen  traMADol (ULTRAM) 50 MG tablet TAKE 1 TABLET (50 MG TOTAL) BY MOUTH 2 (TWO) TIMES DAILY.  60 tablet  4  . diphenhydramine-acetaminophen (TYLENOL PM) 25-500 MG TABS Take 2 tablets by mouth at bedtime as needed (sleep/pain).      . pantoprazole (PROTONIX) 40 MG tablet Take 80 mg by mouth daily with lunch.       No current facility-administered medications for this visit.    Allergies as of 03/04/2013 - Review Complete 02/02/2013  Allergen Reaction Noted  . Ace inhibitors Cough 12/11/2010  . Keflex [cephalexin] Diarrhea and Nausea And Vomiting 07/24/2011  . Nitrofurantoin Diarrhea and Nausea And Vomiting 09/08/2009  . Penicillins Hives, Rash, and Other (See Comments) 09/17/2007    Family History  Problem Relation Age of Onset  . Hypertension Mother   . Diabetes Mother   . Heart failure Mother   . Dementia Mother   . Emphysema Father   . Hypertension Father   . Diabetes Brother   . GER  disease Brother   . Cancer      family history   . Diabetes      family history   . Heart defect      famiily history   . Arthritis      family history   . Anesthesia problems Neg Hx   . Hypotension Neg Hx   . Malignant hyperthermia Neg Hx   . Pseudochol deficiency Neg Hx   . Colon cancer Neg Hx     History   Social History  . Marital Status: Divorced    Spouse Name: N/A    Number of Children: 2  . Years of Education: N/A   Occupational History  . retired     Social History Main Topics  . Smoking status: Former Smoker    Quit date: 09/21/1994  . Smokeless tobacco: Former Systems developer    Quit date: 09/04/1996  . Alcohol Use: No  . Drug Use: No  . Sexual Activity: Yes    Birth Control/ Protection: Surgical   Other Topics Concern  . None   Social History Narrative  . None    Review of Systems: Negative unless mentioned in HPI  Physical Exam: BP 115/77  Pulse 105  Temp(Src) 97.9 F (36.6 C) (Oral)  Ht 5\' 6"  (1.676 m)  Wt 206 lb (93.441 kg)  BMI 33.27 kg/m2 General:   Alert and oriented. No distress noted. Pleasant and cooperative.  Head:  Normocephalic and atraumatic. Eyes:  Conjuctiva clear without scleral icterus. Mouth:  Oral mucosa pink and moist. Good dentition. No lesions. Neck:  Supple, without mass or thyromegaly. Heart:  S1, S2 present without murmurs, rubs, or gallops. Regular rate and rhythm. Abdomen:  +BS, soft, non-tender and non-distended. No rebound or guarding. No HSM or masses noted. Msk:  Symmetrical without gross deformities. Normal posture. Extremities:  Without edema. Neurologic:  Alert and  oriented x4;  grossly normal neurologically. Skin:  Intact without significant lesions or rashes. Cervical Nodes:  No significant cervical adenopathy. Psych:  Alert and cooperative. Normal mood and affect.

## 2013-03-04 NOTE — Patient Instructions (Addendum)
Start taking Dexilant once each morning for reflux. We have provided samples, and I sent this medication to your pharmacy.  We have scheduled you for an upper endoscopy with dilation with Dr. Oneida Alar.

## 2013-03-05 NOTE — Assessment & Plan Note (Signed)
67 year old female with worsening GERD, nausea, nocturnal symptoms, early satiety, and vague dysphagia, presenting with need for further evaluation. Likely symptoms secondary to uncontrolled GERD, possible underlying gastritis, PUD, esophagitis, esophageal web, ring or stricture. Last EGD in remote past by Dr. Tamala Julian. Failed Prilosec, Nexium, and most recently Protonix.  Start Dexilant once daily.  Proceed with upper endoscopy and dilation in the near future with Dr. Oneida Alar. The risks, benefits, and alternatives have been discussed in detail with patient. They have stated understanding and desire to proceed.  PROPOFOL DUE TO HX OF PRIOR FAILED SEDATION

## 2013-03-05 NOTE — Assessment & Plan Note (Signed)
See GERD. Dilation as appropriate. Consider BPE.

## 2013-03-05 NOTE — Progress Notes (Signed)
CC'd to PCP 

## 2013-03-10 NOTE — Patient Instructions (Addendum)
Cheryl Abbott  03/10/2013   Your procedure is scheduled on:  03/17/2013  Report to [facility] at  12  AM.  Call this number if you have problems the morning of surgery: [phone]   Remember:   Do not eat food or drink liquids after midnight.   Take these medicines the morning of surgery with A SIP OF WATER:  Xanax, norvasc, protonix, paxil, ultram   Do not wear jewelry, make-up or nail polish.  Do not wear lotions, powders, or perfumes.   Do not shave 48 hours prior to surgery. Men may shave face and neck.  Do not bring valuables to the hospital.  [facility] is not responsible for any belongings or valuables.               Contacts, dentures or bridgework may not be worn into surgery.  Leave suitcase in the car. After surgery it may be brought to your room.  For patients admitted to the hospital, discharge time is determined by your  treatment team.               Patients discharged the day of surgery will not be allowed to drive home.  Name and phone number of your driver: family  Special Instructions: N/A   Please read over the following fact sheets that you were given: Pain Booklet, Coughing and Deep Breathing, Surgical Site Infection Prevention, Anesthesia Post-op Instructions and Care and Recovery After Surgery Esophagogastroduodenoscopy Esophagogastroduodenoscopy (EGD) is a procedure to examine the lining of the esophagus, stomach, and first part of the small intestine (duodenum). A long, flexible, lighted tube with a camera attached (endoscope) is inserted down the throat to view these organs. This procedure is done to detect problems or abnormalities, such as inflammation, bleeding, ulcers, or growths, in order to treat them. The procedure lasts about 5 20 minutes. It is usually an outpatient procedure, but it may need to be performed in emergency cases in the hospital. LET YOUR CAREGIVER KNOW ABOUT:   Allergies to food or medicine.  All medicines you are  taking, including vitamins, herbs, eyedrops, and over-the-counter medicines and creams.  Use of steroids (by mouth or creams).  Previous problems you or members of your family have had with the use of anesthetics.  Any blood disorders you have.  Previous surgeries you have had.  Other health problems you have.  Possibility of pregnancy, if this applies. RISKS AND COMPLICATIONS  Generally, EGD is a safe procedure. However, as with any procedure, complications can occur. Possible complications include:  Infection.  Bleeding.  Tearing (perforation) of the esophagus, stomach, or duodenum.  Difficulty breathing or not being able to breath.  Excessive sweating.  Spasms of the larynx.  Slowed heartbeat.  Low blood pressure. BEFORE THE PROCEDURE  Do not eat or drink anything for 6 8 hours before the procedure or as directed by your caregiver.  Ask your caregiver about changing or stopping your regular medicines.  If you wear dentures, be prepared to remove them before the procedure.  Arrange for someone to drive you home after the procedure. PROCEDURE   A vein will be accessed to give medicines and fluids. A medicine to relax you (sedative) and a pain reliever will be given through that access into the vein.  A numbing medicine (local anesthetic) may be sprayed on your throat for comfort and to stop you from gagging or coughing.  A mouth guard may be placed in your mouth  to protect your teeth and to keep you from biting on the endoscope.  You will be asked to lie on your left side.  The endoscope is inserted down your throat and into the esophagus, stomach, and duodenum.  Air is put through the endoscope to allow your caregiver to view the lining of your esophagus clearly.  The esophagus, stomach, and duodenum is then examined. During the exam, your caregiver may:  Remove tissue to be examined under a microscope (biopsy) for inflammation, infection, or other medical  problems.  Remove growths.  Remove objects (foreign bodies) that are stuck.  Treat any bleeding with medicines or other devices that stop tissues from bleeding (hot cauters, clipping devices).  Widen (dilate) or stretch narrowed areas of the esophagus and stomach.  The endoscope will then be withdrawn. AFTER THE PROCEDURE  You will be taken to a recovery area to be monitored. You will be able to go home once you are stable and alert.  Do not eat or drink anything until the local anesthetic and numbing medicines have worn off. You may choke.  It is normal to feel bloated, have pain with swallowing, or have a sore throat for a short time. This will wear off.  Your caregiver should be able to discuss his or her findings with you. It will take longer to discuss the test results if any biopsies were taken. Document Released: 09/28/2004 Document Revised: 05/14/2012 Document Reviewed: 04/30/2012 [facility] Patient Information 2014 [place], [place]. PATIENT INSTRUCTIONS POST-ANESTHESIA  IMMEDIATELY FOLLOWING SURGERY:  Do not drive or operate machinery for the first twenty four hours after surgery.  Do not make any important decisions for twenty four hours after surgery or while taking narcotic pain medications or sedatives.  If you develop intractable nausea and vomiting or a severe headache please notify your doctor immediately.  FOLLOW-UP:  Please make an appointment with your surgeon as instructed. You do not need to follow up with anesthesia unless specifically instructed to do so.  WOUND CARE INSTRUCTIONS (if applicable):  Keep a dry clean dressing on the anesthesia/puncture wound site if there is drainage.  Once the wound has quit draining you may leave it open to air.  Generally you should leave the bandage intact for twenty four hours unless there is drainage.  If the epidural site drains for more than 36-48 hours please call the anesthesia department.  QUESTIONS?:  Please feel free to  call your physician or the hospital operator if you have any questions, and they will be happy to assist you.

## 2013-03-11 ENCOUNTER — Encounter (HOSPITAL_COMMUNITY)
Admission: RE | Admit: 2013-03-11 | Discharge: 2013-03-11 | Disposition: A | Discharge status: Home / Self Care | Payer: Medicare Other | Source: Ambulatory Visit | Attending: Gastroenterology | Admitting: Gastroenterology

## 2013-03-11 ENCOUNTER — Encounter (HOSPITAL_COMMUNITY): Payer: Self-pay | Admitting: Pharmacy Technician

## 2013-03-11 DIAGNOSIS — Z01812 Encounter for preprocedural laboratory examination: Secondary | ICD-10-CM | POA: Insufficient documentation

## 2013-03-11 DIAGNOSIS — Z01818 Encounter for other preprocedural examination: Secondary | ICD-10-CM | POA: Insufficient documentation

## 2013-03-11 LAB — BASIC METABOLIC PANEL
BUN: 21 mg/dL (ref 6–23)
CO2: 26 mEq/L (ref 19–32)
Calcium: 10.5 mg/dL (ref 8.4–10.5)
Chloride: 101 mEq/L (ref 96–112)
Creatinine, Ser: 1.47 mg/dL — ABNORMAL HIGH (ref 0.50–1.10)
GFR calc Af Amer: 41 mL/min — ABNORMAL LOW (ref >90)
GFR calc non Af Amer: 35 mL/min — ABNORMAL LOW (ref >90)
Glucose, Bld: 84 mg/dL (ref 70–99)
Potassium: 5.3 mEq/L — ABNORMAL HIGH (ref 3.5–5.1)
Sodium: 137 mEq/L (ref 135–145)

## 2013-03-11 LAB — HEMOGLOBIN AND HEMATOCRIT, BLOOD
HCT: 39.5 % (ref 36.0–46.0)
Hemoglobin: 12.9 g/dL (ref 12.0–15.0)

## 2013-03-11 NOTE — Progress Notes (Signed)
03/11/13 1002  OBSTRUCTIVE SLEEP APNEA  Have you ever been diagnosed with sleep apnea through a sleep study? Yes  If yes, do you have and use a CPAP or BPAP machine every night? 0  Do you snore loudly (loud enough to be heard through closed doors)?  1  Do you often feel tired, fatigued, or sleepy during the daytime? 1  Has anyone observed you stop breathing during your sleep? 1  Do you have, or are you being treated for high blood pressure? 1  BMI more than 35 kg/m2? 0  Age over 31 years old? 1  Neck circumference greater than 40 cm/18 inches? 0  Gender: 0  Obstructive Sleep Apnea Score 5  Score 4 or greater  Results sent to PCP

## 2013-03-12 ENCOUNTER — Telehealth: Payer: Self-pay | Admitting: Family Medicine

## 2013-03-12 NOTE — Progress Notes (Signed)
K+ 5.3 reported to Dr Patsey Berthold.  Ordered ISTAT on arrival and instruct pt to hold Potassium 10 meq until we check her labs am of surgery.  Ms Dimare called and instructed to hold  K+ 10 meq .  Rosalyn Gess RN, BSN, CNOR, [employer]

## 2013-03-12 NOTE — Telephone Encounter (Signed)
No note of calling this patient

## 2013-03-12 NOTE — Telephone Encounter (Signed)
No note of calling patient.

## 2013-03-17 ENCOUNTER — Ambulatory Visit (HOSPITAL_COMMUNITY): Payer: Medicare Other | Admitting: Anesthesiology

## 2013-03-17 ENCOUNTER — Ambulatory Visit (HOSPITAL_COMMUNITY)
Admission: RE | Admit: 2013-03-17 | Discharge: 2013-03-17 | Disposition: A | Discharge status: Home / Self Care | Payer: Medicare Other | Source: Ambulatory Visit | Attending: Gastroenterology | Admitting: Gastroenterology

## 2013-03-17 ENCOUNTER — Encounter (HOSPITAL_COMMUNITY)
Admission: RE | Disposition: A | Discharge status: Home / Self Care | Payer: Self-pay | Source: Ambulatory Visit | Attending: Gastroenterology

## 2013-03-17 ENCOUNTER — Encounter (HOSPITAL_COMMUNITY): Payer: Self-pay | Admitting: Anesthesiology

## 2013-03-17 ENCOUNTER — Encounter (HOSPITAL_COMMUNITY): Payer: Self-pay | Admitting: *Deleted

## 2013-03-17 DIAGNOSIS — K3189 Other diseases of stomach and duodenum: Secondary | ICD-10-CM | POA: Insufficient documentation

## 2013-03-17 DIAGNOSIS — I10 Essential (primary) hypertension: Secondary | ICD-10-CM | POA: Insufficient documentation

## 2013-03-17 DIAGNOSIS — K2990 Gastroduodenitis, unspecified, without bleeding: Secondary | ICD-10-CM

## 2013-03-17 DIAGNOSIS — K222 Esophageal obstruction: Secondary | ICD-10-CM | POA: Insufficient documentation

## 2013-03-17 DIAGNOSIS — K2970 Gastritis, unspecified, without bleeding: Secondary | ICD-10-CM

## 2013-03-17 DIAGNOSIS — D131 Benign neoplasm of stomach: Secondary | ICD-10-CM

## 2013-03-17 DIAGNOSIS — R1310 Dysphagia, unspecified: Secondary | ICD-10-CM | POA: Insufficient documentation

## 2013-03-17 DIAGNOSIS — Z01812 Encounter for preprocedural laboratory examination: Secondary | ICD-10-CM | POA: Insufficient documentation

## 2013-03-17 DIAGNOSIS — E119 Type 2 diabetes mellitus without complications: Secondary | ICD-10-CM | POA: Insufficient documentation

## 2013-03-17 HISTORY — PX: SAVORY DILATION: SHX5439

## 2013-03-17 HISTORY — PX: POLYPECTOMY: SHX5525

## 2013-03-17 HISTORY — PX: ESOPHAGOGASTRODUODENOSCOPY (EGD) WITH PROPOFOL: SHX5813

## 2013-03-17 HISTORY — PX: BIOPSY: SHX5522

## 2013-03-17 LAB — POCT I-STAT 4, (NA,K, GLUC, HGB,HCT)
Glucose, Bld: 85 mg/dL (ref 70–99)
HCT: 45 % (ref 36.0–46.0)
Hemoglobin: 15.3 g/dL — ABNORMAL HIGH (ref 12.0–15.0)
Potassium: 3.5 mEq/L (ref 3.5–5.1)
Sodium: 139 mEq/L (ref 135–145)

## 2013-03-17 LAB — GLUCOSE, CAPILLARY: Glucose-Capillary: 90 mg/dL (ref 70–99)

## 2013-03-17 SURGERY — ESOPHAGOGASTRODUODENOSCOPY (EGD) WITH PROPOFOL
Anesthesia: Monitor Anesthesia Care | Site: Esophagus

## 2013-03-17 MED ORDER — MIDAZOLAM HCL 2 MG/2ML IJ SOLN
1.0000 mg | INTRAMUSCULAR | Status: DC | PRN
  Administered 2013-03-17: 2 mg via INTRAVENOUS

## 2013-03-17 MED ORDER — FENTANYL CITRATE 0.05 MG/ML IJ SOLN
INTRAMUSCULAR | Status: DC | PRN
  Administered 2013-03-17 (×2): 25 ug via INTRAVENOUS

## 2013-03-17 MED ORDER — FENTANYL CITRATE 0.05 MG/ML IJ SOLN: 25.0000 ug | INTRAMUSCULAR | Status: DC | PRN

## 2013-03-17 MED ORDER — MIDAZOLAM HCL 2 MG/2ML IJ SOLN
INTRAMUSCULAR | Status: AC
  Filled 2013-03-17: qty 2

## 2013-03-17 MED ORDER — ONDANSETRON HCL 4 MG/2ML IJ SOLN: 4.0000 mg | Freq: Once | INTRAMUSCULAR | Status: DC | PRN

## 2013-03-17 MED ORDER — PANTOPRAZOLE SODIUM 40 MG PO TBEC: DELAYED_RELEASE_TABLET | ORAL | Status: DC

## 2013-03-17 MED ORDER — LACTATED RINGERS IV SOLN
INTRAVENOUS | Status: DC | PRN
  Administered 2013-03-17: 09:00:00 via INTRAVENOUS

## 2013-03-17 MED ORDER — FENTANYL CITRATE 0.05 MG/ML IJ SOLN
25.0000 ug | INTRAMUSCULAR | Status: AC
  Administered 2013-03-17 (×2): 25 ug via INTRAVENOUS

## 2013-03-17 MED ORDER — BUTAMBEN-TETRACAINE-BENZOCAINE 2-2-14 % EX AERO
1.0000 | INHALATION_SPRAY | Freq: Once | CUTANEOUS | Status: AC
  Administered 2013-03-17: 1 via TOPICAL
  Filled 2013-03-17: qty 56

## 2013-03-17 MED ORDER — MIDAZOLAM HCL 5 MG/5ML IJ SOLN
INTRAMUSCULAR | Status: DC | PRN
  Administered 2013-03-17 (×2): 1 mg via INTRAVENOUS

## 2013-03-17 MED ORDER — FENTANYL CITRATE 0.05 MG/ML IJ SOLN
INTRAMUSCULAR | Status: AC
  Filled 2013-03-17: qty 2

## 2013-03-17 MED ORDER — LIDOCAINE HCL (PF) 1 % IJ SOLN
INTRAMUSCULAR | Status: AC
  Filled 2013-03-17: qty 5

## 2013-03-17 MED ORDER — PROPOFOL 10 MG/ML IV BOLUS
INTRAVENOUS | Status: AC
  Filled 2013-03-17: qty 20

## 2013-03-17 MED ORDER — GLYCOPYRROLATE 0.2 MG/ML IJ SOLN
INTRAMUSCULAR | Status: AC
  Filled 2013-03-17: qty 1

## 2013-03-17 MED ORDER — LACTATED RINGERS IV SOLN
INTRAVENOUS | Status: DC
  Administered 2013-03-17: 08:00:00 via INTRAVENOUS

## 2013-03-17 MED ORDER — STERILE WATER FOR IRRIGATION IR SOLN
Status: DC | PRN
  Administered 2013-03-17: 09:00:00

## 2013-03-17 MED ORDER — GLYCOPYRROLATE 0.2 MG/ML IJ SOLN
0.2000 mg | Freq: Once | INTRAMUSCULAR | Status: AC
  Administered 2013-03-17: 0.2 mg via INTRAVENOUS

## 2013-03-17 MED ORDER — LIDOCAINE HCL (CARDIAC) 10 MG/ML IV SOLN
INTRAVENOUS | Status: DC | PRN
  Administered 2013-03-17: 50 mg via INTRAVENOUS

## 2013-03-17 MED ORDER — PROPOFOL INFUSION 10 MG/ML OPTIME
INTRAVENOUS | Status: DC | PRN
  Administered 2013-03-17: 75 ug/kg/min via INTRAVENOUS

## 2013-03-17 SURGICAL SUPPLY — 9 items
BLOCK BITE 60FR ADLT L/F BLUE (MISCELLANEOUS) ×3 IMPLANT
FLOOR PAD 36X40 (MISCELLANEOUS) ×3
FORCEPS BIOP RAD 4 LRG CAP 4 (CUTTING FORCEPS) ×2 IMPLANT
FORMALIN 10 PREFIL 20ML (MISCELLANEOUS) ×2 IMPLANT
MANIFOLD NEPTUNE II (INSTRUMENTS) ×3 IMPLANT
PAD FLOOR 36X40 (MISCELLANEOUS) ×2 IMPLANT
TUBING ENDO SMARTCAP PENTAX (MISCELLANEOUS) ×3 IMPLANT
TUBING IRRIGATION ENDOGATOR (MISCELLANEOUS) ×3 IMPLANT
WATER STERILE IRR 1000ML POUR (IV SOLUTION) ×2 IMPLANT

## 2013-03-17 NOTE — OR Nursing (Signed)
50 mcg fentynl given to [facility]

## 2013-03-17 NOTE — Anesthesia Postprocedure Evaluation (Signed)
  Anesthesia Post-op Note  Patient: Cheryl Abbott  Procedure(s) Performed: Procedure(s) with comments: ESOPHAGOGASTRODUODENOSCOPY (EGD) WITH PROPOFOL (N/A) SAVORY DILATION (N/A) - #12.8, 14, 15, 16 dilators used GASTRIC BIOPSIES (N/A) GASTRIC POLYPECTOMY (N/A)  Patient Location: PACU  Anesthesia Type:MAC  Level of Consciousness: sedated and patient cooperative  Airway and Oxygen Therapy: Patient Spontanous Breathing and Patient connected to face mask oxygen  Post-op Pain: none  Post-op Assessment: Post-op Vital signs reviewed, Patient's Cardiovascular Status Stable, Respiratory Function Stable, Patent Airway, No signs of Nausea or vomiting and Pain level controlled  Post-op Vital Signs: Reviewed and stable  Complications: No apparent anesthesia complications

## 2013-03-17 NOTE — H&P (Signed)
Primary Care Physician:  Tula Nakayama, MD Primary Gastroenterologist:  Dr. Oneida Alar  Pre-Procedure History & Physical: HPI:  Cheryl Abbott is a 67 y.o. female here for DYSPHAGIA/DYSEPSIA.  Past Medical History  Diagnosis Date  . Anxiety disorder   . Diabetes mellitus type 1   . Hyperlipidemia   . Hypertension   . Sleep apnea   . Disease of vein   . Coronary artery disease   . Complication of anesthesia     pt states she was not given enough medication with  tcs with Dr Tamala Julian                                    last [place].as able to feel and hear everything.  . Depression   . DEMENTIA   . Renal insufficiency   . Kidney stones   . DDD (degenerative disc disease), lumbar   . Chronic back pain   . Sciatica     Past Surgical History  Procedure Laterality Date  . Partial hysterectomy    . Lithotryspy and stent replacement rt ureter  10/2004    12/2009 Dr, Michela Pitcher   . Removal of lump from posterior r thigh    . Colonoscopy  08/21/06    adenomatous polyps/  . Cardiac catheterization  2005  . Flexible sigmoidoscopy  09/11/2011    [id number] Internal hemorrhoids  . Extraction of left kidney stone    . Bilateral stone removal with nephrostomy at [facility]    . Colonoscopy  10/02/2011    [id number] SIGMOID COLON DIVERTICULOSIS/hemorrhoids    Prior to Admission medications   Medication Sig Start Date End Date Taking? Authorizing Provider  ALPRAZolam Duanne Moron) 0.5 MG tablet TAKE 1 TABLET BY MOUTH TWICE A DAY 01/15/13  Yes [name], MD  amLODipine (NORVASC) 5 MG tablet Take 1 tablet (5 mg total) by mouth daily. 02/02/13  Yes [name], MD  diphenhydramine-acetaminophen (TYLENOL PM) 25-500 MG TABS Take 2 tablets by mouth at bedtime as needed (sleep/pain).   Yes Historical Provider, MD  ibuprofen (ADVIL,MOTRIN) 800 MG tablet Take 1 tablet (800 mg total) by mouth daily. For pain 02/02/13  Yes [name], MD  lovastatin (MEVACOR) 40 MG tablet Take 2  tablets (80 mg total) by mouth at bedtime. Take two tablets at bedtime. Dose increase effective 02/14/2012 02/02/13  Yes [name], MD  metFORMIN (GLUCOPHAGE) 500 MG tablet TAKE 2 TABLETS (1,000 MG TOTAL) BY MOUTH 2 (TWO) TIMES DAILY WITH A MEAL. 02/07/13  Yes [name], MD  pantoprazole (PROTONIX) 40 MG tablet Take 80 mg by mouth daily with lunch.   Yes Historical Provider, MD  PARoxetine (PAXIL) 40 MG tablet Take 1 tablet (40 mg total) by mouth daily. 02/02/13  Yes [name], MD  potassium citrate (UROCIT-K) 10 MEQ (1080 MG) SR tablet Take 10 mEq by mouth 3 (three) times daily with meals.   Yes Historical Provider, MD  traMADol (ULTRAM) 50 MG tablet TAKE 1 TABLET (50 MG TOTAL) BY MOUTH 2 (TWO) TIMES DAILY. 02/10/13  Yes [name], MD    Allergies as of 03/04/2013 - Review Complete 02/02/2013  Allergen Reaction Noted  . Ace inhibitors Cough 12/11/2010  . Keflex [cephalexin] Diarrhea and Nausea And Vomiting 07/24/2011  . Nitrofurantoin Diarrhea and Nausea And Vomiting 09/08/2009  . Penicillins Hives, Rash, and Other (See Comments) 09/17/2007    Family History  Problem Relation Age of Onset  . Hypertension Mother   . Diabetes Mother   . Heart failure Mother   . Dementia Mother   . Emphysema Father   . Hypertension Father   . Diabetes Brother   . GER disease Brother   . Cancer      family history   . Diabetes      family history   . Heart defect      famiily history   . Arthritis      family history   . Anesthesia problems Neg Hx   . Hypotension Neg Hx   . Malignant hyperthermia Neg Hx   . Pseudochol deficiency Neg Hx   . Colon cancer Neg Hx     History   Social History  . Marital Status: Divorced    Spouse Name: N/A    Number of Children: 2  . Years of Education: N/A   Occupational History  . retired     Social History Main Topics  . Smoking status: Former Smoker    Quit date: 09/21/1994  . Smokeless tobacco: Former Systems developer    Quit  date: 09/04/1996  . Alcohol Use: No  . Drug Use: No  . Sexual Activity: Yes    Birth Control/ Protection: Surgical   Other Topics Concern  . Not on file   Social History Narrative  . No narrative on file    Review of Systems: See HPI, otherwise negative ROS   Physical Exam: BP 124/76  Pulse 79  Temp(Src) 98.6 F (37 C) (Oral)  Resp 13 General:   Alert,  pleasant and cooperative in NAD Head:  Normocephalic and atraumatic. Neck:  Supple; Lungs:  Clear throughout to auscultation.    Heart:  Regular rate and rhythm. Abdomen:  Soft, nontender and nondistended. Normal bowel sounds, without guarding, and without rebound.   Neurologic:  Alert and  oriented x4;  grossly normal neurologically.  Impression/Plan:     DYSPHAGIA/DYSEPSIA  PLAN:  EGD/?DIL TODAY

## 2013-03-17 NOTE — Transfer of Care (Signed)
Immediate Anesthesia Transfer of Care Note  Patient: Cheryl Abbott  Procedure(s) Performed: Procedure(s) with comments: ESOPHAGOGASTRODUODENOSCOPY (EGD) WITH PROPOFOL (N/A) SAVORY DILATION (N/A) - #12.8, 14, 15, 16 dilators used GASTRIC BIOPSIES (N/A) GASTRIC POLYPECTOMY (N/A)  Patient Location: PACU  Anesthesia Type:MAC  Level of Consciousness: sedated and patient cooperative  Airway & Oxygen Therapy: Patient Spontanous Breathing and Patient connected to face mask oxygen  Post-op Assessment: Report given to PACU RN and Post -op Vital signs reviewed and stable  Post vital signs: Reviewed and stable  Complications: No apparent anesthesia complications

## 2013-03-17 NOTE — Anesthesia Procedure Notes (Signed)
Procedure Name: MAC Date/Time: 03/17/2013 8:48 AM Performed by: Antony Contras, AMY L Pre-anesthesia Checklist: Patient identified, Timeout performed, Emergency Drugs available, Suction available and Patient being monitored Oxygen Delivery Method: Non-rebreather mask

## 2013-03-17 NOTE — Op Note (Signed)
[facility] [address] [place], [address]   ENDOSCOPY PROCEDURE REPORT  PATIENT: Cheryl Abbott, Cheryl Abbott  MR#: [id number] BIRTHDATE: February 20, 1946 , 67  yrs. old GENDER: Female  ENDOSCOPIST: Barney Drain, MD REFFERED [id number] Moshe Cipro, M.D.  PROCEDURE DATE:  03/17/2013 PROCEDURE:   EGD with biopsy and EGD with dilatation over guidewire   INDICATIONS:1.  dyspepsia.   2.  dysphagia. MEDICATIONS: MAC sedation, administered by CRNA TOPICAL ANESTHETIC: Cetacaine Spray  DESCRIPTION OF PROCEDURE:   After the risks benefits and alternatives of the procedure were thoroughly explained, informed consent was obtained.  The     endoscope was introduced through the mouth and advanced to the second portion of the duodenum. The instrument was slowly withdrawn as the mucosa was carefully examined.  Prior to withdrawal of the scope, the guidwire was placed.  The esophagus was dilated successfully.  The patient was recovered in endoscopy and discharged home in satisfactory condition.   ESOPHAGUS: A stricture was found at the gastroesophageal junction. The stenosis was traversable with the endoscope.  STOMACH: Mild non-erosive gastritis (inflammation) was found. Multiple biopsies were performed using cold forceps.   Multiple small sessile polyps were found in the gastric body.  Multiple biopsies was performed using cold forceps.  DUODENUM: The duodenal mucosa showed no abnormalities in the bulb and second portion of the duodenum.   Dilation was then performed at the gastroesphageal junction  Dilator: Savary over guidewire Size(s): 12.8-16 MM Resistance: minimal Heme: none  COMPLICATIONS: There were no complications.   ENDOSCOPIC IMPRESSION: 1.   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   MILD Non-erosive gastritis DUE TO IBUPROFEN 3.   Multiple small sessile polyps in the gastric body  RECOMMENDATIONS: CONTINUE WEIGHT LOSS EFFORTS.  LOSE 10  LBS. TAKE ONE PROTONIX 30 MINUTES PRIOR TO BREAKFAST AND SUPPER. FOLLOW A LOW FAT DIET/DIABETIC DIET. BIOPSY WILL BE BACK IN 7 DAYS  FOLLOW UP IN 3 MOS.      _______________________________ Lorrin MaisBarney Drain, MD 03/17/2013 9:33 AM      PATIENT NAME:  Kinberli, Rawls MR#: [id number]

## 2013-03-17 NOTE — Anesthesia Preprocedure Evaluation (Signed)
Anesthesia Evaluation  Patient identified by MRN, date of birth, ID band Patient awake    Reviewed: Allergy & Precautions, H&P , NPO status , Patient's Chart, lab work & pertinent test results  Airway Mallampati: II  Neck ROM: Full    Dental  (+) Teeth Intact   Pulmonary sleep apnea , former smoker,  breath sounds clear to auscultation        Cardiovascular hypertension, Pt. on medications + CAD Rhythm:Regular     Neuro/Psych PSYCHIATRIC DISORDERS Anxiety    GI/Hepatic GERD-  Medicated and Controlled,  Endo/Other    Renal/GU      Musculoskeletal   Abdominal   Peds  Hematology   Anesthesia Other Findings   Reproductive/Obstetrics                           Anesthesia Physical Anesthesia Plan  ASA: III  Anesthesia Plan: MAC   Post-op Pain Management:    Induction: Intravenous  Airway Management Planned: Simple Face Mask  Additional Equipment:   Intra-op Plan:   Post-operative Plan:   Informed Consent: I have reviewed the patients History and Physical, chart, labs and discussed the procedure including the risks, benefits and alternatives for the proposed anesthesia with the patient or authorized representative who has indicated his/her understanding and acceptance.     Plan Discussed with:   Anesthesia Plan Comments:         Anesthesia Quick Evaluation

## 2013-03-17 NOTE — Preoperative (Signed)
Beta Blockers   Reason not to administer Beta Blockers:Not Applicable 

## 2013-03-17 NOTE — Progress Notes (Signed)
REVIEWED.  

## 2013-03-18 ENCOUNTER — Encounter (HOSPITAL_COMMUNITY): Payer: Self-pay | Admitting: Gastroenterology

## 2013-03-18 ENCOUNTER — Encounter: Payer: Self-pay | Admitting: Gastroenterology

## 2013-03-18 DIAGNOSIS — N189 Chronic kidney disease, unspecified: Secondary | ICD-10-CM | POA: Insufficient documentation

## 2013-03-18 NOTE — Progress Notes (Signed)
PLEASE CALL PT. HER KIDNEY FUNCTIONS IS NOT NORMAL. SHE SHOULD AVOID IBUPROFEN AND HAVE IT RECHECKED BY DR. Moshe Cipro ION 2 WEEKS.

## 2013-03-19 ENCOUNTER — Other Ambulatory Visit: Payer: Self-pay | Admitting: Family Medicine

## 2013-03-20 ENCOUNTER — Telehealth: Payer: Self-pay | Admitting: Gastroenterology

## 2013-03-20 ENCOUNTER — Telehealth: Payer: Self-pay | Admitting: Family Medicine

## 2013-03-20 NOTE — Telephone Encounter (Signed)
Noted that patient had xray of lumbar in June.  Please advise.

## 2013-03-20 NOTE — Telephone Encounter (Signed)
Please call pt. HER stomach Bx shows MILD gastritis.   CONTINUE YOUR WEIGHT LOSS EFFORTS.Marland Kitchen  TAKE ONE PROTONIX 30 MINUTES PRIOR TO BREAKFAST AND SUPPER.  FOLLOW A LOW FAT DIET/DIABETIC DIET.   FOLLOW UP IN 3 MOS E30 DYSPHAGIA AND GERD.Marland Kitchen

## 2013-03-20 NOTE — Telephone Encounter (Signed)
pls advise her of this, let her know that the xray would not be very different unless she had trauma and is worried about a fracture. If her concern ios for increased and uncontrolled back pain, I know she has a bulging disc in her back and has ahd epidural injection in the past for this. Will need to sched OV for re assesment of this pls  explain

## 2013-03-21 ENCOUNTER — Emergency Department (HOSPITAL_COMMUNITY)
Admission: EM | Admit: 2013-03-21 | Discharge: 2013-03-21 | Disposition: A | Discharge status: Home / Self Care | Payer: Medicare Other | Attending: Emergency Medicine | Admitting: Emergency Medicine

## 2013-03-21 ENCOUNTER — Other Ambulatory Visit: Payer: Self-pay

## 2013-03-21 ENCOUNTER — Emergency Department (HOSPITAL_COMMUNITY): Payer: Medicare Other

## 2013-03-21 ENCOUNTER — Encounter (HOSPITAL_COMMUNITY): Payer: Self-pay | Admitting: Emergency Medicine

## 2013-03-21 DIAGNOSIS — Y929 Unspecified place or not applicable: Secondary | ICD-10-CM | POA: Insufficient documentation

## 2013-03-21 DIAGNOSIS — F411 Generalized anxiety disorder: Secondary | ICD-10-CM | POA: Insufficient documentation

## 2013-03-21 DIAGNOSIS — M549 Dorsalgia, unspecified: Secondary | ICD-10-CM

## 2013-03-21 DIAGNOSIS — F3289 Other specified depressive episodes: Secondary | ICD-10-CM | POA: Insufficient documentation

## 2013-03-21 DIAGNOSIS — Z79899 Other long term (current) drug therapy: Secondary | ICD-10-CM | POA: Insufficient documentation

## 2013-03-21 DIAGNOSIS — S298XXA Other specified injuries of thorax, initial encounter: Secondary | ICD-10-CM | POA: Insufficient documentation

## 2013-03-21 DIAGNOSIS — I2510 Atherosclerotic heart disease of native coronary artery without angina pectoris: Secondary | ICD-10-CM | POA: Insufficient documentation

## 2013-03-21 DIAGNOSIS — M5430 Sciatica, unspecified side: Secondary | ICD-10-CM | POA: Insufficient documentation

## 2013-03-21 DIAGNOSIS — G8929 Other chronic pain: Secondary | ICD-10-CM | POA: Insufficient documentation

## 2013-03-21 DIAGNOSIS — Z87442 Personal history of urinary calculi: Secondary | ICD-10-CM | POA: Insufficient documentation

## 2013-03-21 DIAGNOSIS — X58XXXA Exposure to other specified factors, initial encounter: Secondary | ICD-10-CM | POA: Insufficient documentation

## 2013-03-21 DIAGNOSIS — IMO0002 Reserved for concepts with insufficient information to code with codable children: Secondary | ICD-10-CM | POA: Insufficient documentation

## 2013-03-21 DIAGNOSIS — Y939 Activity, unspecified: Secondary | ICD-10-CM | POA: Insufficient documentation

## 2013-03-21 DIAGNOSIS — E109 Type 1 diabetes mellitus without complications: Secondary | ICD-10-CM | POA: Insufficient documentation

## 2013-03-21 DIAGNOSIS — F329 Major depressive disorder, single episode, unspecified: Secondary | ICD-10-CM | POA: Insufficient documentation

## 2013-03-21 DIAGNOSIS — Z87891 Personal history of nicotine dependence: Secondary | ICD-10-CM | POA: Insufficient documentation

## 2013-03-21 DIAGNOSIS — F0390 Unspecified dementia without behavioral disturbance: Secondary | ICD-10-CM | POA: Insufficient documentation

## 2013-03-21 DIAGNOSIS — I10 Essential (primary) hypertension: Secondary | ICD-10-CM | POA: Insufficient documentation

## 2013-03-21 DIAGNOSIS — E785 Hyperlipidemia, unspecified: Secondary | ICD-10-CM | POA: Insufficient documentation

## 2013-03-21 DIAGNOSIS — Z9861 Coronary angioplasty status: Secondary | ICD-10-CM | POA: Insufficient documentation

## 2013-03-21 DIAGNOSIS — Z87448 Personal history of other diseases of urinary system: Secondary | ICD-10-CM | POA: Insufficient documentation

## 2013-03-21 DIAGNOSIS — Z880 Allergy status to penicillin: Secondary | ICD-10-CM | POA: Insufficient documentation

## 2013-03-21 LAB — GLUCOSE, CAPILLARY: Glucose-Capillary: 93 mg/dL (ref 70–99)

## 2013-03-21 MED ORDER — HYDROCODONE-ACETAMINOPHEN 5-325 MG PO TABS: ORAL_TABLET | ORAL | Status: DC

## 2013-03-21 MED ORDER — METHOCARBAMOL 500 MG PO TABS: 500.0000 mg | ORAL_TABLET | Freq: Three times a day (TID) | ORAL | Status: DC

## 2013-03-21 NOTE — ED Provider Notes (Signed)
CSN: [id number]     Arrival date & time 03/21/13  1337 History   First MD Initiated Contact with Patient 03/21/13 1604     Chief Complaint  Patient presents with  . Back Pain   (Consider location/radiation/quality/duration/timing/severity/associated sxs/prior Treatment) Patient is a 67 y.o. female presenting with back pain. The history is provided by the patient.  Back Pain Location:  Thoracic spine Quality:  Aching Radiates to: radiates across her mid back and around her lower chest. Pain severity:  Moderate Pain is:  Same all the time Onset quality:  Gradual Duration:  1 month Timing:  Constant Progression:  Unchanged Chronicity:  Chronic Context comment:  Patient reports pain to her mid back and lower chest since a family member gave her a "bear hug" one month ago Relieved by:  Being still and lying down Worsened by:  Bending, twisting, sitting, standing and movement Ineffective treatments:  OTC medications Associated symptoms: chest pain   Associated symptoms: no abdominal pain, no abdominal swelling, no bladder incontinence, no bowel incontinence, no dysuria, no fever, no headaches, no leg pain, no numbness, no paresthesias, no pelvic pain, no perianal numbness, no tingling and no weakness   Chest pain:    Chest pain quality: "soreness"   Severity:  Moderate   Onset quality:  Gradual   Duration:  1 month   Timing:  Constant   Progression:  Unchanged   Chronicity:  Chronic   Past Medical History  Diagnosis Date  . Anxiety disorder   . Diabetes mellitus type 1   . Hyperlipidemia   . Hypertension   . Sleep apnea   . Disease of vein   . Coronary artery disease   . Complication of anesthesia     pt states she was not given enough medication with  tcs with Dr Tamala Julian                                    last [place].as able to feel and hear everything.  . Depression   . DEMENTIA   . Renal insufficiency   . Kidney stones   . DDD (degenerative disc disease), lumbar   .  Chronic back pain   . Sciatica    Past Surgical History  Procedure Laterality Date  . Partial hysterectomy    . Lithotryspy and stent replacement rt ureter  10/2004    12/2009 Dr, Michela Pitcher   . Removal of lump from posterior r thigh    . Colonoscopy  08/21/06    adenomatous polyps/  . Cardiac catheterization  2005  . Flexible sigmoidoscopy  09/11/2011    [id number] Internal hemorrhoids  . Extraction of left kidney stone    . Bilateral stone removal with nephrostomy at [facility]    . Colonoscopy  10/02/2011    [id number] SIGMOID COLON DIVERTICULOSIS/hemorrhoids  . Esophagogastroduodenoscopy (egd) with propofol N/A 03/17/2013    Procedure: ESOPHAGOGASTRODUODENOSCOPY (EGD) WITH PROPOFOL;  Surgeon: Danie Binder, MD;  Location: [facility];  Service: Endoscopy;  Laterality: N/A;  . Savory dilation N/A 03/17/2013    Procedure: SAVORY DILATION;  Surgeon: Danie Binder, MD;  Location: [facility];  Service: Endoscopy;  Laterality: N/A;  #12.8, 14, 15, 16 dilators used  . Esophageal biopsy N/A 03/17/2013    Procedure: GASTRIC BIOPSIES;  Surgeon: Danie Binder, MD;  Location: [facility];  Service: Endoscopy;  Laterality: N/A;  . Polypectomy N/A 03/17/2013  Procedure: GASTRIC POLYPECTOMY;  Surgeon: Danie Binder, MD;  Location: [facility];  Service: Endoscopy;  Laterality: N/A;   Family History  Problem Relation Age of Onset  . Hypertension Mother   . Diabetes Mother   . Heart failure Mother   . Dementia Mother   . Emphysema Father   . Hypertension Father   . Diabetes Brother   . GER disease Brother   . Cancer      family history   . Diabetes      family history   . Heart defect      famiily history   . Arthritis      family history   . Anesthesia problems Neg Hx   . Hypotension Neg Hx   . Malignant hyperthermia Neg Hx   . Pseudochol deficiency Neg Hx   . Colon cancer Neg Hx    History  Substance Use Topics  . Smoking status: Former Smoker    Quit date: 09/21/1994  . Smokeless  tobacco: Former Systems developer    Quit date: 09/04/1996  . Alcohol Use: No   OB History   Grav Para Term Preterm Abortions TAB SAB Ect Mult Living                 Review of Systems  Constitutional: Negative for fever.  Respiratory: Negative for cough, chest tightness, shortness of breath and wheezing.   Cardiovascular: Positive for chest pain.  Gastrointestinal: Negative for vomiting, abdominal pain, constipation, abdominal distention and bowel incontinence.  Genitourinary: Negative for bladder incontinence, dysuria, hematuria, flank pain, decreased urine volume, difficulty urinating and pelvic pain.       No perineal numbness or incontinence of urine or feces  Musculoskeletal: Positive for back pain. Negative for joint swelling and neck pain.  Skin: Negative for rash.  Neurological: Negative for dizziness, tingling, weakness, numbness, headaches and paresthesias.  All other systems reviewed and are negative.    Allergies  Ace inhibitors; Keflex; Nitrofurantoin; and Penicillins  Home Medications   Current Outpatient Rx  Name  Route  Sig  Dispense  Refill  . ALPRAZolam (XANAX) 0.5 MG tablet   Oral   Take 0.5 mg by mouth 2 (two) times daily.         Marland Kitchen amLODipine (NORVASC) 5 MG tablet   Oral   Take 1 tablet (5 mg total) by mouth daily.   30 tablet   4   . diphenhydramine-acetaminophen (TYLENOL PM) 25-500 MG TABS   Oral   Take 2 tablets by mouth at bedtime as needed (sleep/pain).         Marland Kitchen ibuprofen (ADVIL,MOTRIN) 800 MG tablet   Oral   Take 1 tablet (800 mg total) by mouth daily. For pain   30 tablet   3   . lovastatin (MEVACOR) 40 MG tablet   Oral   Take 2 tablets (80 mg total) by mouth at bedtime. Take two tablets at bedtime. Dose increase effective 02/14/2012   60 tablet   4   . metFORMIN (GLUCOPHAGE) 500 MG tablet   Oral   Take 1,000 mg by mouth 2 (two) times daily with a meal.         . pantoprazole (PROTONIX) 40 MG tablet   Oral   Take 40 mg by mouth 2  (two) times daily.         Marland Kitchen PARoxetine (PAXIL) 40 MG tablet   Oral   Take 40 mg by mouth daily.         Marland Kitchen  potassium citrate (UROCIT-K) 10 MEQ (1080 MG) SR tablet   Oral   Take 10 mEq by mouth 3 (three) times daily with meals.         . traMADol (ULTRAM) 50 MG tablet   Oral   Take 50 mg by mouth 2 (two) times daily.          BP 136/84  Pulse 94  Temp(Src) 98.5 F (36.9 C) (Oral)  Resp 18  Ht 5' 6.5" (1.689 m)  Wt 206 lb (93.441 kg)  BMI 32.76 kg/m2  SpO2 100% Physical Exam  Nursing note and vitals reviewed. Constitutional: She is oriented to person, place, and time. She appears well-developed and well-nourished. No distress.  HENT:  Head: Normocephalic and atraumatic.  Neck: Normal range of motion. Neck supple.  Cardiovascular: Normal rate, regular rhythm, normal heart sounds and intact distal pulses.   No murmur heard. Pulmonary/Chest: Effort normal and breath sounds normal. No respiratory distress. She has no wheezes. She has no rales. Chest wall is not dull to percussion. She exhibits tenderness. She exhibits no laceration, no crepitus, no edema, no deformity, no swelling and no retraction.    ttp of the bilateral lower, anterior ribs.  No edema, crepitus or splinting.    Abdominal: Soft. Normal appearance. She exhibits no distension. There is no hepatosplenomegaly. There is no tenderness. There is no rebound, no guarding and no CVA tenderness.  Musculoskeletal: She exhibits tenderness. She exhibits no edema.       Thoracic back: She exhibits tenderness. She exhibits normal range of motion, no swelling, no edema, no deformity and no laceration.       Lumbar back: She exhibits tenderness and pain. She exhibits normal range of motion, no swelling, no deformity, no laceration and normal pulse.       Back:  ttp of the thoracic spine and paraspinal muscles.   DP pulses are brisk and symmetrical.  Distal sensation intact.  Pain reproduced with movement of the UE's.      Neurological: She is alert and oriented to person, place, and time. She has normal strength. No sensory deficit. She exhibits normal muscle tone. Coordination and gait normal.  Reflex Scores:      Patellar reflexes are 2+ on the right side and 2+ on the left side.      Achilles reflexes are 2+ on the right side and 2+ on the left side. Skin: Skin is warm and dry. No rash noted.    ED Course  Procedures (including critical care time) Labs Review Results for orders placed during the hospital encounter of 03/21/13  GLUCOSE, CAPILLARY      Result Value Range   Glucose-Capillary 93  70 - 99 mg/dL    Imaging Review Dg Thoracic Spine 2 View  03/21/2013   CLINICAL DATA:  Mid thoracic spine pain.  EXAM: THORACIC SPINE - 2 VIEW  COMPARISON:  No priors.  FINDINGS: Three views of the thoracic spine demonstrate no definite acute displaced fractures or compression type fractures. Visualized portions of the bony thorax appears grossly intact. Elevation of the right hemidiaphragm, similar to the prior chest x-ray 10/25/2012.  IMPRESSION: 1. No acute abnormality of the thoracic spine to account for the patient's symptoms.   Electronically Signed   By: Vinnie Langton M.D.   On: 03/21/2013 15:09     MDM     Date: 03/21/2013  Rate: 86  Rhythm: normal sinus rhythm  QRS Axis: normal  Intervals: normal  ST/T Wave abnormalities: normal  Conduction Disutrbances:none  Narrative Interpretation:   Old EKG Reviewed: improved from previous EKG 10/25/12  EKG read by Dr. Alvino Chapel.  Patient has hx of mid back and lower chest wall pain for one month,  Pain is reproduced with certain movements and improves at rest.  Vital are stable, no tachycardia, tachypnea , hypoxia.  Patient ambulates with steady gait.  No focal neuro deficits.  She agrees to close f/u with her PMD .  Will prescribe vicodin and robaxin .  She appears stable for discharge    Odaly Peri L. Vanessa Hurricane, PA-C 03/23/13 [id number]

## 2013-03-21 NOTE — ED Notes (Signed)
Mid-lower back pain since first part of September after family member gave her a "bear hug" at a family reunion.  NAD

## 2013-03-23 NOTE — ED Provider Notes (Signed)
Medical screening examination/treatment/procedure(s) were performed by non-physician practitioner and as supervising physician I was immediately available for consultation/collaboration.  Jasper Riling. Alvino Chapel, MD 03/23/13 1515

## 2013-03-23 NOTE — Progress Notes (Signed)
Called and informed pt.  

## 2013-03-23 NOTE — Telephone Encounter (Signed)
Reminder in epic °

## 2013-03-23 NOTE — Progress Notes (Signed)
Please see recommendations by Dr. Oneida Alar: needs to have renal function followed up on with Dr. Moshe Cipro in 2 weeks. Avoid NSAIDs.

## 2013-03-24 NOTE — Telephone Encounter (Signed)
Called and informed pt.  

## 2013-03-25 NOTE — Telephone Encounter (Signed)
Patient coming in 10/16 for evaluation of back pain and is aware that an xray may not be indicated.

## 2013-03-26 ENCOUNTER — Ambulatory Visit (INDEPENDENT_AMBULATORY_CARE_PROVIDER_SITE_OTHER): Payer: Medicare Other | Admitting: Family Medicine

## 2013-03-26 ENCOUNTER — Encounter: Payer: Self-pay | Admitting: Family Medicine

## 2013-03-26 ENCOUNTER — Ambulatory Visit (HOSPITAL_COMMUNITY)
Admission: RE | Admit: 2013-03-26 | Discharge: 2013-03-26 | Disposition: A | Discharge status: Home / Self Care | Payer: Medicare Other | Source: Ambulatory Visit | Attending: Family Medicine | Admitting: Family Medicine

## 2013-03-26 VITALS — BP 124/84 | HR 90 | Resp 18 | Ht 67.0 in | Wt 206.0 lb

## 2013-03-26 DIAGNOSIS — I10 Essential (primary) hypertension: Secondary | ICD-10-CM

## 2013-03-26 DIAGNOSIS — E1121 Type 2 diabetes mellitus with diabetic nephropathy: Secondary | ICD-10-CM

## 2013-03-26 DIAGNOSIS — E1129 Type 2 diabetes mellitus with other diabetic kidney complication: Secondary | ICD-10-CM

## 2013-03-26 DIAGNOSIS — Z1231 Encounter for screening mammogram for malignant neoplasm of breast: Secondary | ICD-10-CM | POA: Insufficient documentation

## 2013-03-26 DIAGNOSIS — Z139 Encounter for screening, unspecified: Secondary | ICD-10-CM

## 2013-03-26 DIAGNOSIS — IMO0002 Reserved for concepts with insufficient information to code with codable children: Secondary | ICD-10-CM

## 2013-03-26 DIAGNOSIS — N058 Unspecified nephritic syndrome with other morphologic changes: Secondary | ICD-10-CM

## 2013-03-26 NOTE — Patient Instructions (Signed)
Annual wellness as before  You are referred for an MRI of your mid back since you continue to have disabling back pain  Non fasting cmp and EGFr and hBA1C for December visit please

## 2013-03-26 NOTE — Progress Notes (Signed)
  Subjective:    Patient ID: SELEST YOX, female    DOB: [birth year], 67 y.o.   MRN: [id number]  HPI  Pt reports since Oct 1 she has had severe mid back pain around the Bra area not at the front, pain is constant , unable to do her routine housework, seen in the ED, nothing relieves the pain, this has not been an issue before, and is not like the pain from her kidney stones. She has established lumbar disc disease, and this suggests that a similar problem may be in the mid back Pain disturbs her rest and limits her activity  Review of Systems See HPI Denies recent fever or chills. Denies sinus pressure, nasal congestion, ear pain or sore throat. Denies chest congestion, productive cough or wheezing. Denies chest pains, palpitations and leg swelling Denies abdominal pain, nausea, vomiting,diarrhea or constipation.   Denies dysuria, frequency, hesitancy or incontinence. Denies uncontrolled  depression, has increased anxiety and  Insomnia due to pain Denies skin break down or rash. Recently had upper endo with dilation which she states has helped her alot        Objective:   Physical Exam  Patient alert and oriented and in no cardiopulmonary distress.Pt in pain  HEENT: No facial asymmetry, EOMI, no sinus tenderness,  oropharynx pink and moist.  Neck supple no adenopathy.  Chest: Clear to auscultation bilaterally.  CVS: S1, S2 no murmurs, no S3.  ABD: Soft non tender. Bowel sounds normal.  Ext: No edema  MS: Decreased ROM thoraco lumbar spine due to pain  Skin: Intact, no ulcerations or rash noted.  Psych: Good eye contact, normal affect. Memory intact not anxious or depressed appearing.  CNS: CN 2-12 intact, power,  normal throughout.       Assessment & Plan:

## 2013-03-28 NOTE — Assessment & Plan Note (Signed)
Controlled, no change in medication  

## 2013-03-28 NOTE — Assessment & Plan Note (Signed)
2 week  h/o increased and disabling mid back  Pain, established disc disease in lumbar spine, needs MRI to furhter evaluate

## 2013-03-31 ENCOUNTER — Ambulatory Visit (HOSPITAL_COMMUNITY)
Admission: RE | Admit: 2013-03-31 | Discharge: 2013-03-31 | Disposition: A | Discharge status: Home / Self Care | Payer: Medicare Other | Source: Ambulatory Visit | Attending: Family Medicine | Admitting: Family Medicine

## 2013-03-31 DIAGNOSIS — R209 Unspecified disturbances of skin sensation: Secondary | ICD-10-CM | POA: Insufficient documentation

## 2013-03-31 DIAGNOSIS — IMO0002 Reserved for concepts with insufficient information to code with codable children: Secondary | ICD-10-CM

## 2013-03-31 DIAGNOSIS — M546 Pain in thoracic spine: Secondary | ICD-10-CM | POA: Insufficient documentation

## 2013-04-03 ENCOUNTER — Telehealth: Payer: Self-pay

## 2013-04-03 NOTE — Telephone Encounter (Signed)
From her bra area up to her shoulders is still hurting her. Dull constant pain all the time. Is aware her MRI is normal but she can't understand why since she is still having so much pain.   [id number]

## 2013-04-06 ENCOUNTER — Other Ambulatory Visit: Payer: Self-pay | Admitting: Family Medicine

## 2013-04-06 DIAGNOSIS — M549 Dorsalgia, unspecified: Secondary | ICD-10-CM

## 2013-04-06 MED ORDER — METHOCARBAMOL 500 MG PO TABS: ORAL_TABLET | ORAL | Status: AC

## 2013-04-06 MED ORDER — IBUPROFEN 800 MG PO TABS: ORAL_TABLET | ORAL | Status: AC

## 2013-04-06 NOTE — Telephone Encounter (Signed)
Called pt back , states her mid back pain is aggravated by walking, bending or standing. Have had low back pain in the past, but this upper back pain is new Will refer to Dr Aline Brochure

## 2013-04-06 NOTE — Telephone Encounter (Signed)
pls refer to Dr Aline Brochure, for ealuation of uncontrolled mid back pain, after Nov 3 per pt request

## 2013-04-07 NOTE — Telephone Encounter (Signed)
She doesn't want anything else sent in, she said she would just take the ibuprofen

## 2013-04-07 NOTE — Telephone Encounter (Signed)
When Dr. Moshe Cipro rx her muscle relaxers when she went to drug store they were 20.00 and wanted Dr. Moshe Cipro to see if she could not get them for the same price as before 2.65 told patient that Dr. Moshe Cipro does not do that may she got more pills this time or strength she needs to call her insurance company

## 2013-04-07 NOTE — Telephone Encounter (Signed)
pls clarify, iss pt asking for a different relaxant that is affordable. If so , pls find out pharmacy what pill that would be so I can send in, thank you

## 2013-04-08 ENCOUNTER — Telehealth: Payer: Self-pay | Admitting: Family Medicine

## 2013-04-08 ENCOUNTER — Telehealth: Payer: Self-pay | Admitting: Orthopedic Surgery

## 2013-04-08 NOTE — Telephone Encounter (Signed)
Noted  

## 2013-04-08 NOTE — Telephone Encounter (Signed)
Dr. Moshe Cipro has referred Cheryl Abbott for back pain.  Please review Dr. Griffin Dakin office notes and the xr /mri reports and advise if to schedule here.

## 2013-04-15 ENCOUNTER — Ambulatory Visit: Payer: Medicare Other | Admitting: Family Medicine

## 2013-04-20 NOTE — Telephone Encounter (Signed)
No note from Dr. Aline Brochure regarding this matter so I gave him the XR and MRI reports for review.  He advised patient got to Neurosurgery

## 2013-04-21 ENCOUNTER — Other Ambulatory Visit: Payer: Self-pay | Admitting: Family Medicine

## 2013-04-21 DIAGNOSIS — M546 Pain in thoracic spine: Secondary | ICD-10-CM

## 2013-04-21 NOTE — Telephone Encounter (Signed)
I recommend that Dr Rolena Infante , ortho spine surgeon in [place] eval pt, as long as he takes her insurance. Pls let pt know of Dr Ruthe Mannan recommendation , and therefore i am referring her to Dr Rolena Infante. Referral is entered

## 2013-05-02 IMAGING — CR DG ABDOMEN 1V
1 series · 1 of 1 positions shown · non-contrast
Comparison: 01/18/2010.

CLINICAL DATA: 65-year-old female with right flank pain, nausea,
kidney stones.

ABDOMEN - 1 VIEW

[view not recorded]
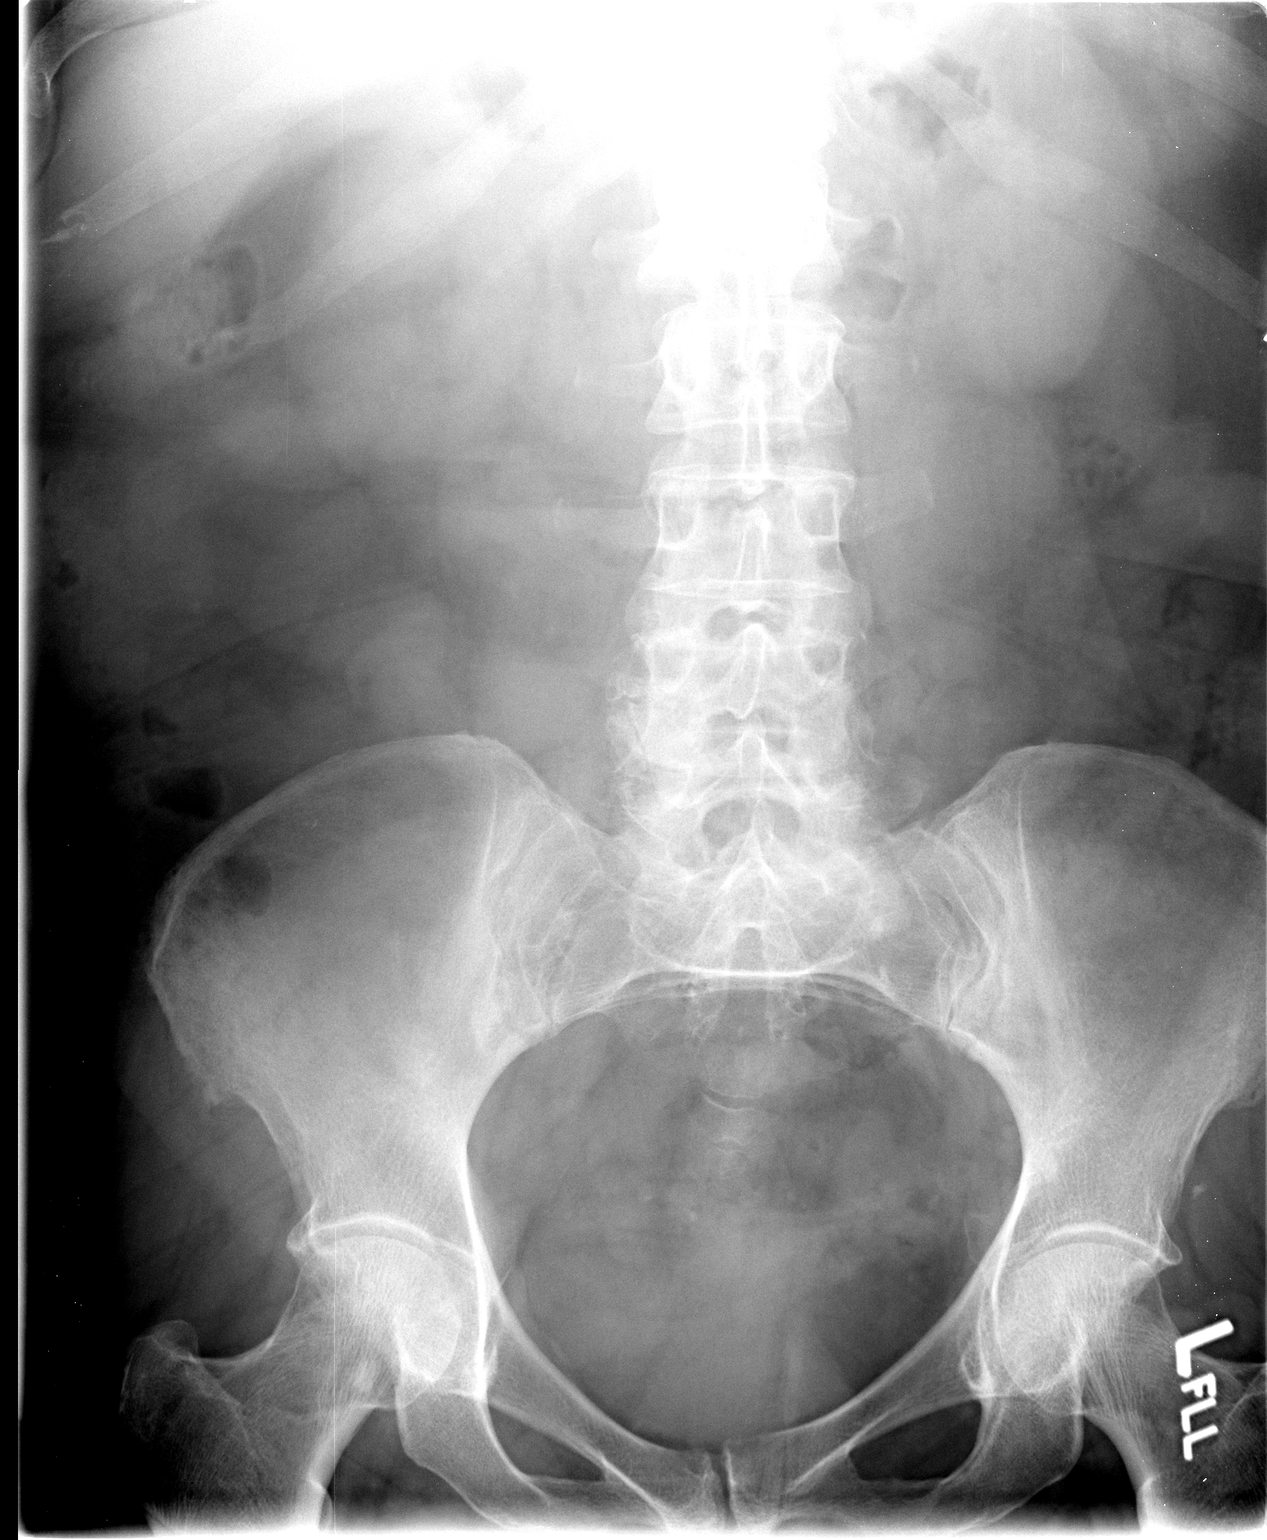

[1 of 1 positions shown; findings below may reference images not displayed]

FINDINGS: Previously seen staghorn calculi in the right upper pole
region no longer identified.  Likewise, previously seen left renal
pelvis level large calculus is no longer seen.  Right double-J
ureteral stent has been removed.  Punctate right lower pole
calculus now present.  No other urologic calculi identified.  Small
pelvic phleboliths. Visualized bowel gas pattern is nonobstructed.
No acute osseous abnormality identified.
IMPRESSION: Interval decreased bilateral nephrolithiasis.  Punctate right lower
pole calculus identified.  No  right ureteral calculus identified
by plain radiographic technique.

## 2013-05-08 ENCOUNTER — Other Ambulatory Visit: Payer: Self-pay | Admitting: Family Medicine

## 2013-05-10 ENCOUNTER — Encounter (HOSPITAL_COMMUNITY): Payer: Self-pay | Admitting: Emergency Medicine

## 2013-05-10 ENCOUNTER — Emergency Department (HOSPITAL_COMMUNITY)
Admission: EM | Admit: 2013-05-10 | Discharge: 2013-05-10 | Disposition: A | Discharge status: Home / Self Care | Payer: Medicare Other | Attending: Emergency Medicine | Admitting: Emergency Medicine

## 2013-05-10 ENCOUNTER — Emergency Department (HOSPITAL_COMMUNITY): Payer: Medicare Other

## 2013-05-10 DIAGNOSIS — H9209 Otalgia, unspecified ear: Secondary | ICD-10-CM | POA: Insufficient documentation

## 2013-05-10 DIAGNOSIS — M549 Dorsalgia, unspecified: Secondary | ICD-10-CM | POA: Insufficient documentation

## 2013-05-10 DIAGNOSIS — I10 Essential (primary) hypertension: Secondary | ICD-10-CM | POA: Insufficient documentation

## 2013-05-10 DIAGNOSIS — R51 Headache: Secondary | ICD-10-CM | POA: Insufficient documentation

## 2013-05-10 DIAGNOSIS — F411 Generalized anxiety disorder: Secondary | ICD-10-CM | POA: Insufficient documentation

## 2013-05-10 DIAGNOSIS — F329 Major depressive disorder, single episode, unspecified: Secondary | ICD-10-CM | POA: Insufficient documentation

## 2013-05-10 DIAGNOSIS — Z87442 Personal history of urinary calculi: Secondary | ICD-10-CM | POA: Insufficient documentation

## 2013-05-10 DIAGNOSIS — I2510 Atherosclerotic heart disease of native coronary artery without angina pectoris: Secondary | ICD-10-CM | POA: Insufficient documentation

## 2013-05-10 DIAGNOSIS — G8929 Other chronic pain: Secondary | ICD-10-CM | POA: Insufficient documentation

## 2013-05-10 DIAGNOSIS — Z87891 Personal history of nicotine dependence: Secondary | ICD-10-CM | POA: Insufficient documentation

## 2013-05-10 DIAGNOSIS — E109 Type 1 diabetes mellitus without complications: Secondary | ICD-10-CM | POA: Insufficient documentation

## 2013-05-10 DIAGNOSIS — Z79899 Other long term (current) drug therapy: Secondary | ICD-10-CM | POA: Insufficient documentation

## 2013-05-10 DIAGNOSIS — R42 Dizziness and giddiness: Secondary | ICD-10-CM | POA: Insufficient documentation

## 2013-05-10 DIAGNOSIS — E785 Hyperlipidemia, unspecified: Secondary | ICD-10-CM | POA: Insufficient documentation

## 2013-05-10 DIAGNOSIS — G4730 Sleep apnea, unspecified: Secondary | ICD-10-CM | POA: Insufficient documentation

## 2013-05-10 DIAGNOSIS — M25519 Pain in unspecified shoulder: Secondary | ICD-10-CM | POA: Insufficient documentation

## 2013-05-10 DIAGNOSIS — R519 Headache, unspecified: Secondary | ICD-10-CM

## 2013-05-10 DIAGNOSIS — Z880 Allergy status to penicillin: Secondary | ICD-10-CM | POA: Insufficient documentation

## 2013-05-10 DIAGNOSIS — F3289 Other specified depressive episodes: Secondary | ICD-10-CM | POA: Insufficient documentation

## 2013-05-10 HISTORY — DX: Headache: R51

## 2013-05-10 HISTORY — DX: Headache, unspecified: R51.9

## 2013-05-10 LAB — URINALYSIS, ROUTINE W REFLEX MICROSCOPIC
Bilirubin Urine: NEGATIVE
Glucose, UA: NEGATIVE mg/dL
Hgb urine dipstick: NEGATIVE
Leukocytes, UA: NEGATIVE
Nitrite: NEGATIVE
Protein, ur: NEGATIVE mg/dL
Specific Gravity, Urine: 1.025 (ref 1.005–1.030)
Urobilinogen, UA: 0.2 mg/dL (ref 0.0–1.0)
pH: 6 (ref 5.0–8.0)

## 2013-05-10 LAB — CBC WITH DIFFERENTIAL/PLATELET
Basophils Absolute: 0 10*3/uL (ref 0.0–0.1)
Basophils Relative: 0 % (ref 0–1)
Eosinophils Absolute: 0.2 10*3/uL (ref 0.0–0.7)
Eosinophils Relative: 2 % (ref 0–5)
HCT: 40.8 % (ref 36.0–46.0)
Hemoglobin: 13.3 g/dL (ref 12.0–15.0)
Lymphocytes Relative: 27 % (ref 12–46)
Lymphs Abs: 2.4 10*3/uL (ref 0.7–4.0)
MCH: 29.4 pg (ref 26.0–34.0)
MCHC: 32.6 g/dL (ref 30.0–36.0)
MCV: 90.1 fL (ref 78.0–100.0)
Monocytes Absolute: 0.8 10*3/uL (ref 0.1–1.0)
Monocytes Relative: 9 % (ref 3–12)
Neutro Abs: 5.5 10*3/uL (ref 1.7–7.7)
Neutrophils Relative %: 62 % (ref 43–77)
Platelets: 369 10*3/uL (ref 150–400)
RBC: 4.53 MIL/uL (ref 3.87–5.11)
RDW: 16.2 % — ABNORMAL HIGH (ref 11.5–15.5)
WBC: 8.9 10*3/uL (ref 4.0–10.5)

## 2013-05-10 LAB — COMPREHENSIVE METABOLIC PANEL
ALT: 15 U/L (ref 0–35)
AST: 14 U/L (ref 0–37)
Albumin: 4.3 g/dL (ref 3.5–5.2)
Alkaline Phosphatase: 97 U/L (ref 39–117)
BUN: 19 mg/dL (ref 6–23)
CO2: 25 mEq/L (ref 19–32)
Calcium: 11 mg/dL — ABNORMAL HIGH (ref 8.4–10.5)
Chloride: 101 mEq/L (ref 96–112)
Creatinine, Ser: 1.36 mg/dL — ABNORMAL HIGH (ref 0.50–1.10)
GFR calc Af Amer: 45 mL/min — ABNORMAL LOW (ref >90)
GFR calc non Af Amer: 39 mL/min — ABNORMAL LOW (ref >90)
Glucose, Bld: 78 mg/dL (ref 70–99)
Potassium: 4.8 mEq/L (ref 3.5–5.1)
Sodium: 139 mEq/L (ref 135–145)
Total Bilirubin: 0.2 mg/dL — ABNORMAL LOW (ref 0.3–1.2)
Total Protein: 9.1 g/dL — ABNORMAL HIGH (ref 6.0–8.3)

## 2013-05-10 MED ORDER — MECLIZINE HCL 12.5 MG PO TABS
25.0000 mg | ORAL_TABLET | Freq: Once | ORAL | Status: AC
  Administered 2013-05-10: 25 mg via ORAL
  Filled 2013-05-10: qty 2

## 2013-05-10 MED ORDER — ALPRAZOLAM 0.5 MG PO TABS: 0.5000 mg | ORAL_TABLET | Freq: Two times a day (BID) | ORAL | Status: DC | PRN

## 2013-05-10 NOTE — ED Notes (Signed)
Pt complains of back pain x1 month, headache started this morning.

## 2013-05-10 NOTE — ED Provider Notes (Signed)
CSN: [id number]     Arrival date & time 05/10/13  1304 History  This chart was scribed for Veryl Speak, MD, by Neta Ehlers, ED Scribe. This patient was seen in room APA05/APA05 and the patient's care was started at 1:20 PM.   First MD Initiated Contact with Patient 05/10/13 1314     No chief complaint on file.   The history is provided by the patient. No language interpreter was used.   HPI Comments: Cheryl Abbott is a 67 y.o. Female, with a h/o DM type I, headache, and chronic back pain, who presents to the Emergency Department complaining of pain to her to the occipital region of her head with associated dizziness which began approximately an hour ago while the pt was at church. She states that quick movements increase the dizziness. She denies any head trauma. She also denies tinnitus or urinary problems. She reports she has a h/o similar symptoms; she reports that with the last episode of similar symptoms she fainted. The pt reports that she ran out of xanax five days ago and she wonders if the symptoms are related to that; she has taken two doses of xanax daily for several years. The pt also complains of back pain which has been intermittent for approximately a month, occasional otalgia, and sleep disturbances. She denies a h/o cardiac stents or MI. The pt has a h/o dementia. She is a former smoker.   Past Medical History  Diagnosis Date  . Anxiety disorder   . Diabetes mellitus type 1   . Hyperlipidemia   . Hypertension   . Sleep apnea   . Disease of vein   . Coronary artery disease   . Complication of anesthesia     pt states she was not given enough medication with  tcs with Dr Tamala Julian                                    last [place].as able to feel and hear everything.  . Depression   . DEMENTIA   . Renal insufficiency   . Kidney stones   . DDD (degenerative disc disease), lumbar   . Sciatica   . Chronic back pain   . Chronic shoulder pain   . Headache    Past Surgical  History  Procedure Laterality Date  . Partial hysterectomy    . Lithotryspy and stent replacement rt ureter  10/2004    12/2009 Dr, Michela Pitcher   . Removal of lump from posterior r thigh    . Colonoscopy  08/21/06    adenomatous polyps/  . Cardiac catheterization  2005  . Flexible sigmoidoscopy  09/11/2011    [id number] Internal hemorrhoids  . Extraction of left kidney stone    . Bilateral stone removal with nephrostomy at [facility]    . Colonoscopy  10/02/2011    [id number] SIGMOID COLON DIVERTICULOSIS/hemorrhoids  . Esophagogastroduodenoscopy (egd) with propofol N/A 03/17/2013    Procedure: ESOPHAGOGASTRODUODENOSCOPY (EGD) WITH PROPOFOL;  Surgeon: Danie Binder, MD;  Location: [facility];  Service: Endoscopy;  Laterality: N/A;  . Savory dilation N/A 03/17/2013    Procedure: SAVORY DILATION;  Surgeon: Danie Binder, MD;  Location: [facility];  Service: Endoscopy;  Laterality: N/A;  #12.8, 14, 15, 16 dilators used  . Esophageal biopsy N/A 03/17/2013    Procedure: GASTRIC BIOPSIES;  Surgeon: Danie Binder, MD;  Location: [facility];  Service: Endoscopy;  Laterality: N/A;  . Polypectomy N/A 03/17/2013    Procedure: GASTRIC POLYPECTOMY;  Surgeon: Danie Binder, MD;  Location: [facility];  Service: Endoscopy;  Laterality: N/A;   Family History  Problem Relation Age of Onset  . Hypertension Mother   . Diabetes Mother   . Heart failure Mother   . Dementia Mother   . Emphysema Father   . Hypertension Father   . Diabetes Brother   . GER disease Brother   . Cancer      family history   . Diabetes      family history   . Heart defect      famiily history   . Arthritis      family history   . Anesthesia problems Neg Hx   . Hypotension Neg Hx   . Malignant hyperthermia Neg Hx   . Pseudochol deficiency Neg Hx   . Colon cancer Neg Hx    History  Substance Use Topics  . Smoking status: Former Smoker    Quit date: 09/21/1994  . Smokeless tobacco: Former Systems developer    Quit date: 09/04/1996  . Alcohol  Use: No   No OB history provided.  Review of Systems  HENT: Positive for ear pain.   Genitourinary: Negative for dysuria, hematuria and difficulty urinating.  Musculoskeletal: Positive for back pain.  Neurological: Positive for dizziness and headaches. Negative for syncope.  Psychiatric/Behavioral: Positive for sleep disturbance.  All other systems reviewed and are negative.    Allergies  Ace inhibitors; Keflex; Nitrofurantoin; and Penicillins  Home Medications   Current Outpatient Rx  Name  Route  Sig  Dispense  Refill  . ALPRAZolam (XANAX) 0.5 MG tablet   Oral   Take 0.5 mg by mouth 2 (two) times daily.         Marland Kitchen amLODipine (NORVASC) 5 MG tablet   Oral   Take 1 tablet (5 mg total) by mouth daily.   30 tablet   4   . diphenhydramine-acetaminophen (TYLENOL PM) 25-500 MG TABS   Oral   Take 2 tablets by mouth at bedtime as needed (sleep/pain).         Marland Kitchen HYDROcodone-acetaminophen (NORCO/VICODIN) 5-325 MG per tablet      Take one tab po q 4-6 hrs prn pain   10 tablet   0   . lovastatin (MEVACOR) 40 MG tablet   Oral   Take 2 tablets (80 mg total) by mouth at bedtime. Take two tablets at bedtime. Dose increase effective 02/14/2012   60 tablet   4   . metFORMIN (GLUCOPHAGE) 500 MG tablet   Oral   Take 1,000 mg by mouth 2 (two) times daily with a meal.         . pantoprazole (PROTONIX) 40 MG tablet   Oral   Take 40 mg by mouth 2 (two) times daily.         Marland Kitchen PARoxetine (PAXIL) 40 MG tablet   Oral   Take 40 mg by mouth daily.         . potassium citrate (UROCIT-K) 10 MEQ (1080 MG) SR tablet   Oral   Take 10 mEq by mouth 3 (three) times daily with meals.         . traMADol (ULTRAM) 50 MG tablet   Oral   Take 50 mg by mouth 2 (two) times daily.          Triage Vitals: BP 147/86  Pulse 89  Temp(Src) 98.1 F (36.7 C) (Oral)  Resp 20  Ht 5\' 6"  (1.676 m)  Wt 206 lb (93.441 kg)  BMI 33.27 kg/m2  SpO2 97%  Physical Exam  Nursing note and  vitals reviewed. Constitutional: She is oriented to person, place, and time. She appears well-developed and well-nourished. No distress.  HENT:  Head: Normocephalic and atraumatic.  Eyes: Conjunctivae and EOM are normal. Pupils are equal, round, and reactive to light.  No papiloedema.   Neck: Neck supple. No tracheal deviation present.  Cardiovascular: Normal rate and regular rhythm.   No murmur heard. Pulmonary/Chest: Effort normal and breath sounds normal. No respiratory distress. She has no wheezes.  Abdominal: Soft. There is no tenderness.  Musculoskeletal: Normal range of motion.  Neurological: She is alert and oriented to person, place, and time. She has normal reflexes. No cranial nerve deficit. Coordination normal.  Skin: Skin is warm and dry.  Psychiatric: She has a normal mood and affect. Her behavior is normal.    ED Course  Procedures (including critical care time)  DIAGNOSTIC STUDIES: Oxygen Saturation is 97% on room air, normal by my interpretation.    COORDINATION OF CARE:  1:28 PM- Discussed treatment plan with patient, and the patient agreed to the plan.   Labs Review Labs Reviewed  CBC WITH DIFFERENTIAL - Abnormal; Notable for the following:    RDW 16.2 (*)    All other components within normal limits  COMPREHENSIVE METABOLIC PANEL - Abnormal; Notable for the following:    Creatinine, Ser 1.36 (*)    Calcium 11.0 (*)    Total Protein 9.1 (*)    Total Bilirubin 0.2 (*)    GFR calc non Af Amer 39 (*)    GFR calc Af Amer 45 (*)    All other components within normal limits  URINALYSIS, ROUTINE W REFLEX MICROSCOPIC - Abnormal; Notable for the following:    APPearance HAZY (*)    Ketones, ur TRACE (*)    All other components within normal limits   Imaging Review Ct Head Wo Contrast  05/10/2013   CLINICAL DATA:  Dizziness and headache.  EXAM: CT HEAD WITHOUT CONTRAST  TECHNIQUE: Contiguous axial images were obtained from the base of the skull through the  vertex without intravenous contrast.  COMPARISON:  CT 07/11/2006.  FINDINGS: No mass. No hydrocephalus. No hemorrhage. Orbits are normal. Mild subcortical and deep white matter lucencies noted suggesting chronic white matter ischemia. No acute bony abnormality. Visualized paranasal sinuses are clear. Mastoids are clear.  IMPRESSION: No acute intracranial abnormality. Findings consistent with mild chronic white matter ischemia.   Electronically Signed   By: Marcello Moores  Register   On: 05/10/2013 14:09    EKG Interpretation    Date/Time:  Sunday May 10 2013 13:41:45 EST Ventricular Rate:  88 PR Interval:  184 QRS Duration: 64 QT Interval:  336 QTC Calculation: 406 R Axis:   5 Text Interpretation:  Normal sinus rhythm Cannot rule out Anterior infarct , age undetermined Abnormal ECG When compared with ECG of 21-Mar-2013 16:48, No significant change was found Confirmed by DELOS  MD, Rayneisha Bouza ([id number]) on 05/10/2013 2:19:06 PM            MDM  No diagnosis found. Patient is a 67 year old female presents with complaints of dizziness and headache that started this morning. She states that she feels lightheaded and as if she is going to pass out. She denies any injury or trauma. She tells me that she recently stopped taking her Xanax that she has been taking twice daily  for quite some time. Vital signs are stable her neurologic exam is nonfocal. She was given meclizine and is feeling better. Workup reveals an essentially unremarkable electrolytes panel and blood count. Urine is clear and CT scan of the head reveals no acute intracranial abnormality. At this point I feel she is stable for discharge. I've agreed to provide her with a small number of Xanax that she can take until she follows up with her primary Dr. Her symptoms may well be due to withdrawal from this.   I personally performed the services described in this documentation, which was scribed in my presence. The recorded information has been  reviewed and is accurate.      Veryl Speak, MD 05/10/13 [phone]

## 2013-05-10 NOTE — ED Notes (Signed)
Pt alert & oriented x4, stable gait. Patient given discharge instructions, paperwork & prescription(s). Patient  instructed to stop at the registration desk to finish any additional paperwork. Patient verbalized understanding. Pt left department w/ no further questions. 

## 2013-05-14 LAB — BASIC METABOLIC PANEL
BUN: 19 mg/dL (ref 6–23)
CO2: 28 mEq/L (ref 19–32)
Calcium: 10.6 mg/dL — ABNORMAL HIGH (ref 8.4–10.5)
Chloride: 100 mEq/L (ref 96–112)
Creat: 1.06 mg/dL (ref 0.50–1.10)
Glucose, Bld: 94 mg/dL (ref 70–99)
Potassium: 4.6 mEq/L (ref 3.5–5.3)
Sodium: 137 mEq/L (ref 135–145)

## 2013-05-14 LAB — CBC
HCT: 39.4 % (ref 36.0–46.0)
Hemoglobin: 13.5 g/dL (ref 12.0–15.0)
MCH: 29.2 pg (ref 26.0–34.0)
MCHC: 34.3 g/dL (ref 30.0–36.0)
MCV: 85.1 fL (ref 78.0–100.0)
Platelets: 378 10*3/uL (ref 150–400)
RBC: 4.63 MIL/uL (ref 3.87–5.11)
RDW: 17 % — ABNORMAL HIGH (ref 11.5–15.5)
WBC: 6.4 10*3/uL (ref 4.0–10.5)

## 2013-05-14 LAB — HEMOGLOBIN A1C
Hgb A1c MFr Bld: 6.7 % — ABNORMAL HIGH (ref <5.7)
Mean Plasma Glucose: 146 mg/dL — ABNORMAL HIGH (ref <117)

## 2013-05-20 ENCOUNTER — Encounter: Payer: Self-pay | Admitting: Family Medicine

## 2013-05-20 ENCOUNTER — Ambulatory Visit (INDEPENDENT_AMBULATORY_CARE_PROVIDER_SITE_OTHER): Payer: Medicare Other | Admitting: Family Medicine

## 2013-05-20 VITALS — BP 132/76 | HR 100 | Resp 18 | Ht 67.0 in | Wt 208.0 lb

## 2013-05-20 DIAGNOSIS — F411 Generalized anxiety disorder: Secondary | ICD-10-CM | POA: Insufficient documentation

## 2013-05-20 DIAGNOSIS — F329 Major depressive disorder, single episode, unspecified: Secondary | ICD-10-CM

## 2013-05-20 DIAGNOSIS — F32A Depression, unspecified: Secondary | ICD-10-CM

## 2013-05-20 DIAGNOSIS — N058 Unspecified nephritic syndrome with other morphologic changes: Secondary | ICD-10-CM

## 2013-05-20 DIAGNOSIS — Z0000 Encounter for general adult medical examination without abnormal findings: Secondary | ICD-10-CM

## 2013-05-20 DIAGNOSIS — E785 Hyperlipidemia, unspecified: Secondary | ICD-10-CM

## 2013-05-20 DIAGNOSIS — E1129 Type 2 diabetes mellitus with other diabetic kidney complication: Secondary | ICD-10-CM

## 2013-05-20 DIAGNOSIS — E1121 Type 2 diabetes mellitus with diabetic nephropathy: Secondary | ICD-10-CM

## 2013-05-20 DIAGNOSIS — F3289 Other specified depressive episodes: Secondary | ICD-10-CM

## 2013-05-20 MED ORDER — ALPRAZOLAM 0.5 MG PO TABS: 0.5000 mg | ORAL_TABLET | Freq: Every day | ORAL | Status: DC

## 2013-05-20 NOTE — Progress Notes (Signed)
Subjective:    Patient ID: Cheryl Abbott, female    DOB: Feb 17, 1946, 67 y.o.   MRN: [id number]  HPI Preventive Screening-Counseling & Management   Patient present here today for a Medicare annual wellness visit.   Current Problems (verified)   Medications Prior to Visit Allergies (verified)   PAST HISTORY  Family History: non significant f/h depression, dementia, premature CAD or cancer  Social History Lives on her own, children are adults and in fairly good health. Denies current nicotine, alcohol or street drug use   Risk Factors  Current exercise habits:  Twice weekly 15 minutes , discussed the need to increase to a target of 30 mins total daily for at least 5 days per week  Dietary issues discussed: reduced carbs and fats, increase water intake, heart healthy diet   Cardiac risk factors: diabetes and HTN and hyperlipidemia, no known f/h of premature CAD  Depression Screen  (Note: if answer to either of the following is "Yes", a more complete depression screening is indicated)   Over the past two weeks, have you felt down, depressed or hopeless? No  Over the past two weeks, have you felt little interest or pleasure in doing things? No  Have you lost interest or pleasure in daily life? No  Do you often feel hopeless? No  Do you cry easily over simple problems? No   Activities of Daily Living  In your present state of health, do you have any difficulty performing the following activities?  Driving?: No Managing money?: No Feeding yourself?:No Getting from bed to chair?:No Climbing a flight of stairs?:yes has to hold on Preparing food and eating?:No Bathing or showering?:No Getting dressed?:No Getting to the toilet?:No Using the toilet?:No Moving around from place to place?: No  Fall Risk Assessment In the past year have you fallen or had a near fall?:No Are you currently taking any medications that make you dizzy?:No   Hearing Difficulties: No Do you  often ask people to speak up or repeat themselves?:No Do you experience ringing or noises in your ears?:no Do you have difficulty understanding soft or whispered voices?:No  Cognitive Testing  Alert? Yes Normal Appearance?Yes  Oriented to person? Yes Place? Yes  Time? Yes  Displays appropriate judgment?Yes  Can read the correct time from a watch face? yes Are you having problems remembering things?No  Advanced Directives have been discussed with the patient?Yes , full code   List the Names of Other Physician/Practitioners you currently use: see list   Indicate any recent Medical Services you may have received from other than [facility] providers in the past year (date may be approximate).   Assessment:    Annual Wellness Exam   Plan:    During the course of the visit the patient was educated and counseled about appropriate screening and preventive services including:  A healthy diet is rich in fruit, vegetables and whole grains. Poultry fish, nuts and beans are a healthy choice for protein rather then red meat. A low sodium diet and drinking 64 ounces of water daily is generally recommended. Oils and sweet should be limited. Carbohydrates especially for those who are diabetic or overweight, should be limited to 30-45 gram per meal. It is important to eat on a regular schedule, at least 3 times daily. Snacks should be primarily fruits, vegetables or nuts. It is important that you exercise regularly at least 30 minutes 5 times a week. If you develop chest pain, have severe difficulty breathing, or feel very tired,  stop exercising immediately and seek medical attention  Immunization reviewed and updated. Cancer screening reviewed and updated    Patient Instructions (the written plan) was given to the patient.  Medicare Attestation  I have personally reviewed:  The patient's medical and social history  Their use of alcohol, tobacco or illicit drugs  Their current medications and  supplements  The patient's functional ability including ADLs,fall risks, home safety risks, cognitive, and hearing and visual impairment  Diet and physical activities  Evidence for depression or mood disorders  The patient's weight, height, BMI, and visual acuity have been recorded in the chart. I have made referrals, counseling, and provided education to the patient based on review of the above and I have provided the patient with a written personalized care plan for preventive services.      Review of Systems     Objective:   Physical Exam        Assessment & Plan:

## 2013-05-20 NOTE — Patient Instructions (Addendum)
F/u in 4 month, call if yiou need me before  Start aspirin 81 mg one daily   Reduce intake of sugar and candy  You will be referred to [facility] pulmonary  after next visit per your request for re evaluation of sleep apnea so that you can be treated HBA1C, fasting lipid, cmp and EGFR  In 4 months

## 2013-05-23 IMAGING — US US SOFT TISSUE HEAD/NECK
1 series · 9 of 9 positions shown · non-contrast
Comparison: 08/03/2010

CLINICAL DATA: Hypercalcemia question parathyroid disease

THYROID ULTRASOUND
TECHNIQUE: Ultrasound examination of the thyroid gland and adjacent
soft tissues was performed, targeted to the parathyroid gland
regions.

[Series 1: us soft tissue head/neck · 0.07mm/px · 9 of 9 slices shown]
[im 1/9]
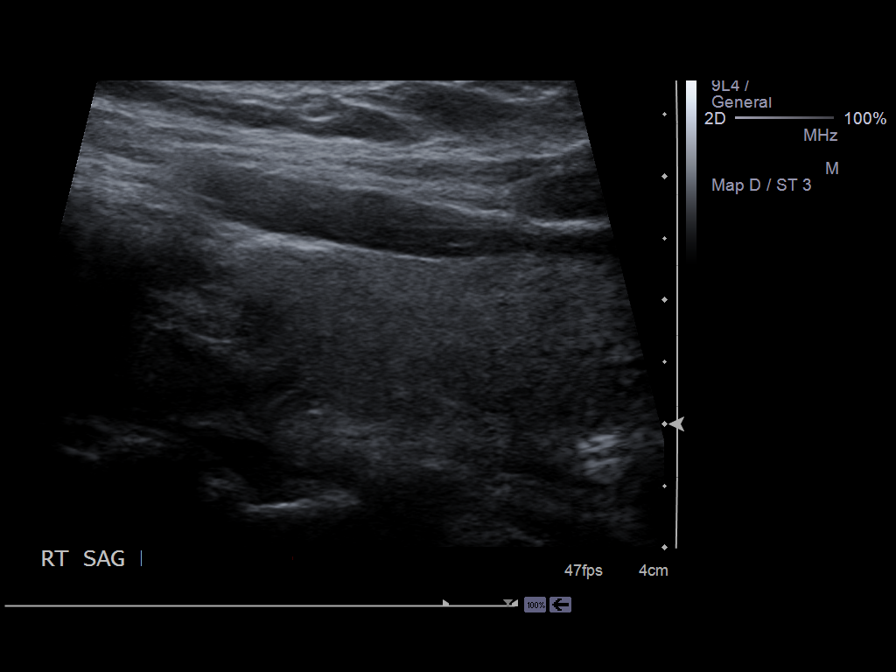
[im 2/9]
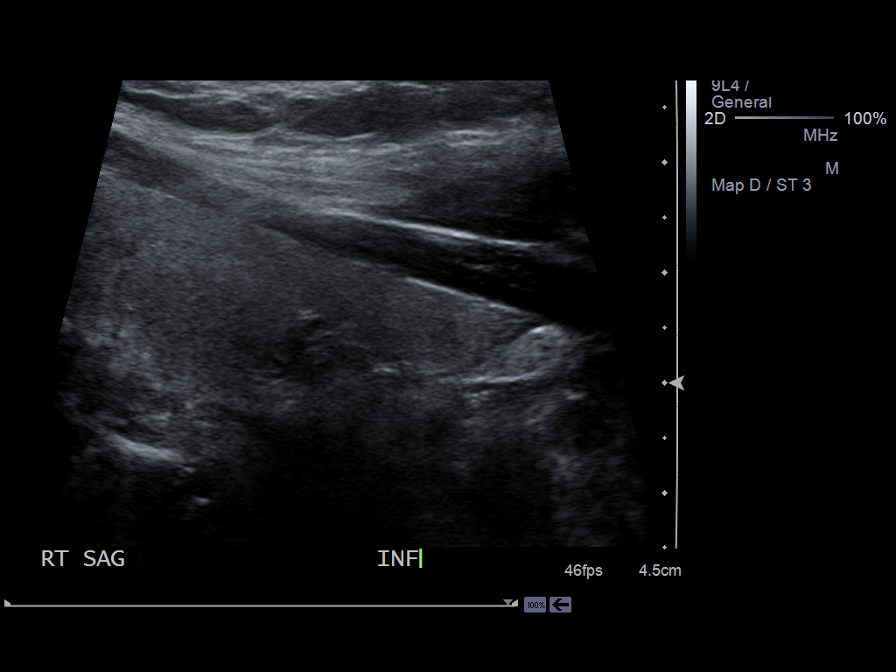
[im 3/9]
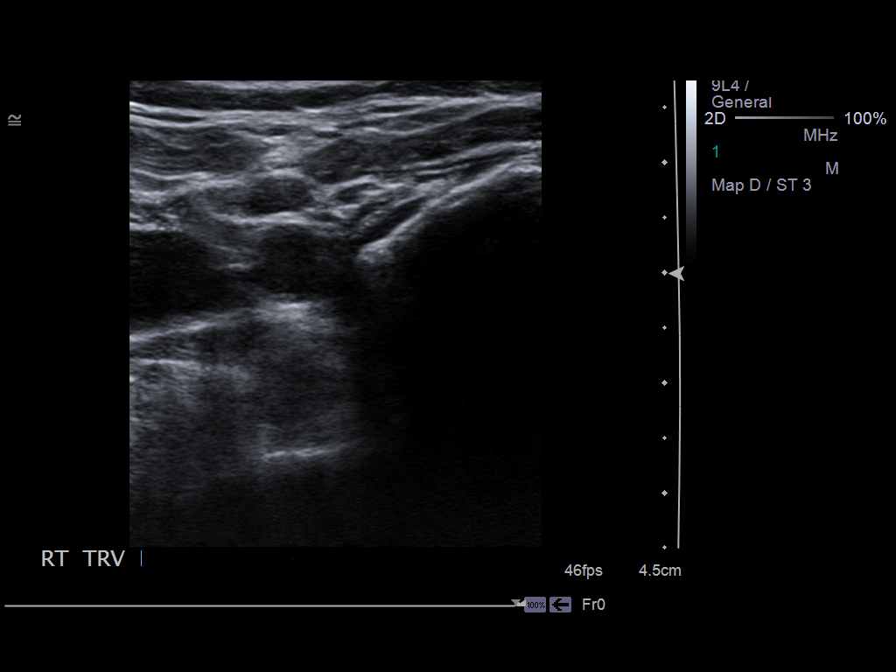
[im 4/9]
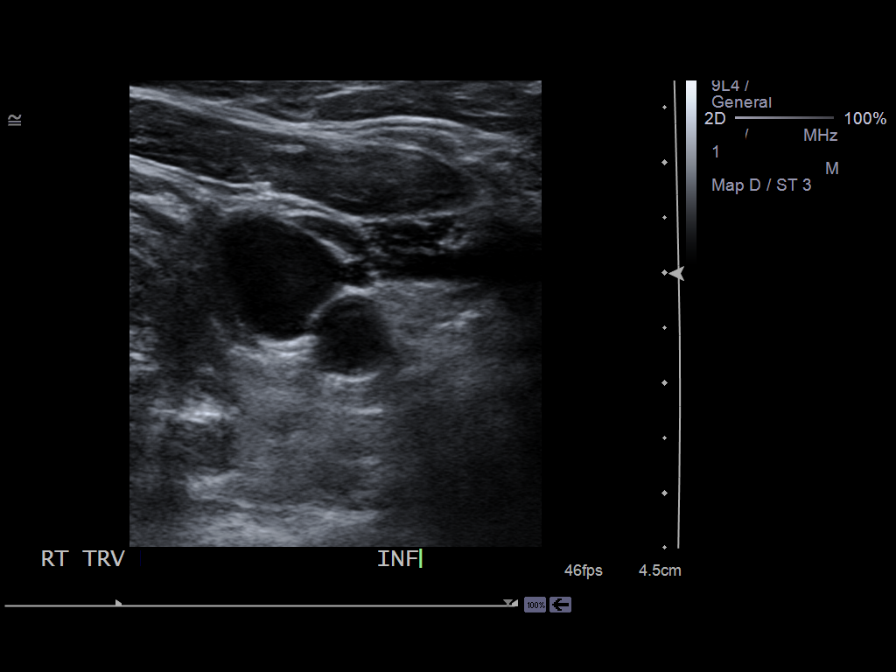
[im 5/9]
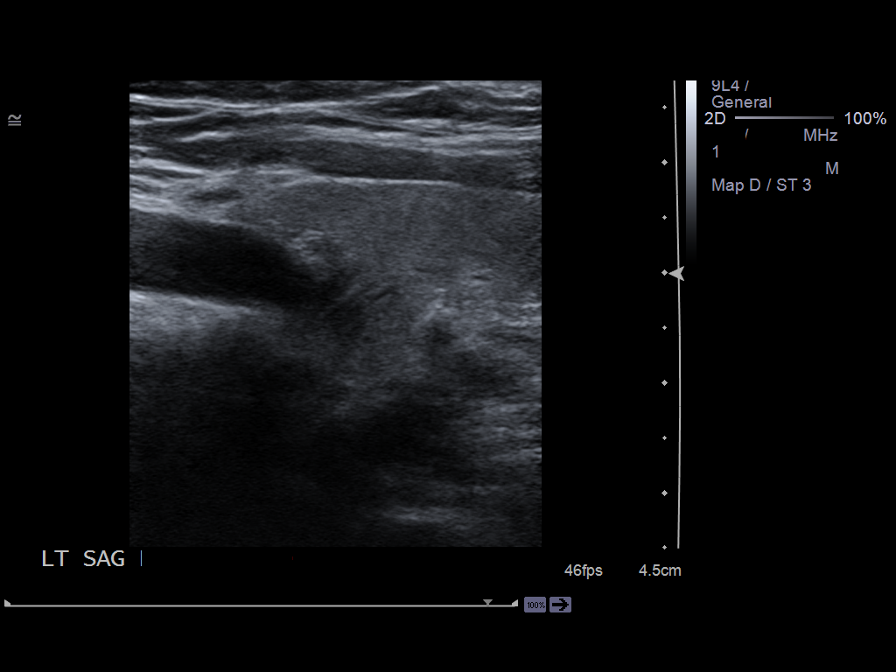
[im 6/9]
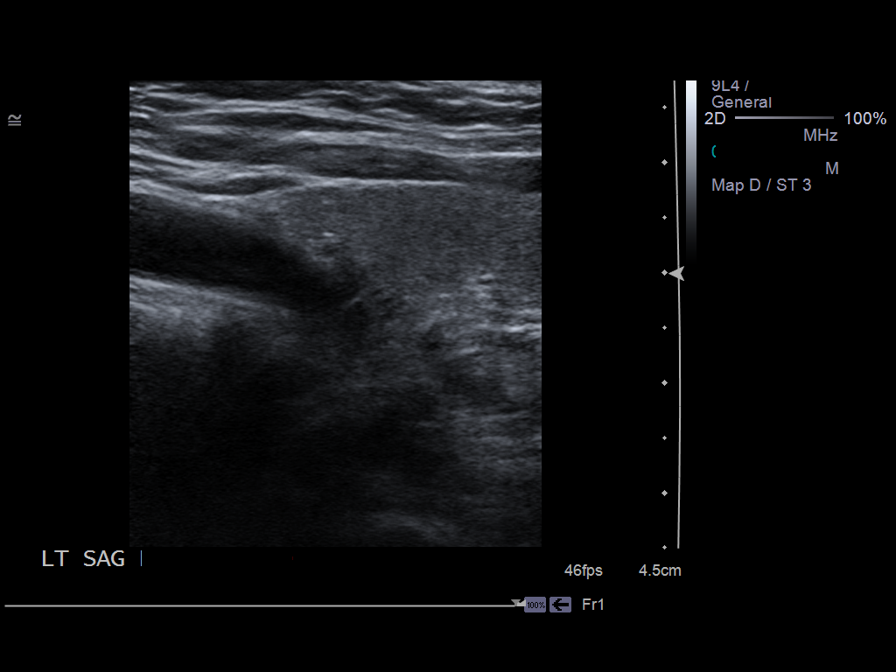
[im 7/9]
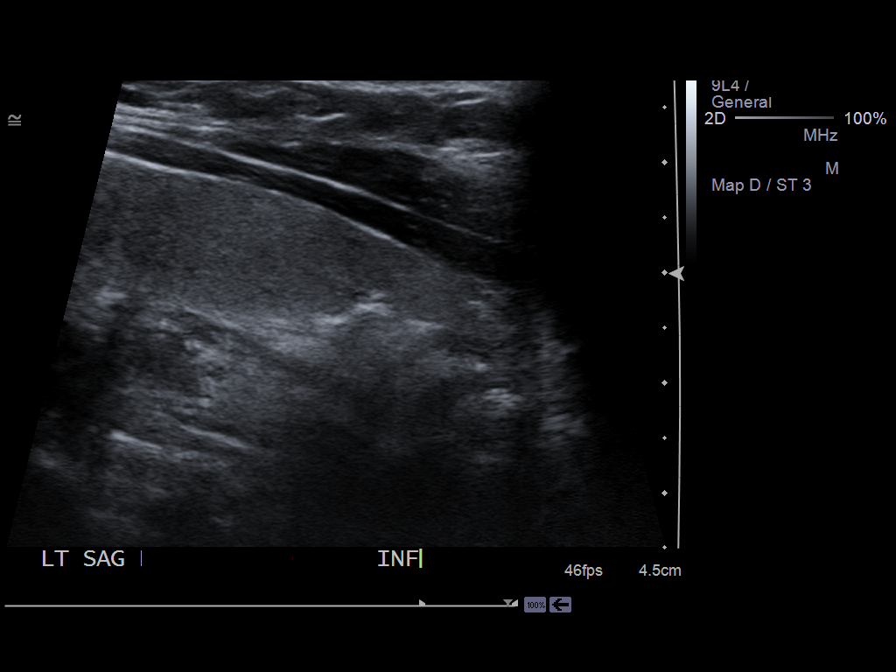
[im 8/9]
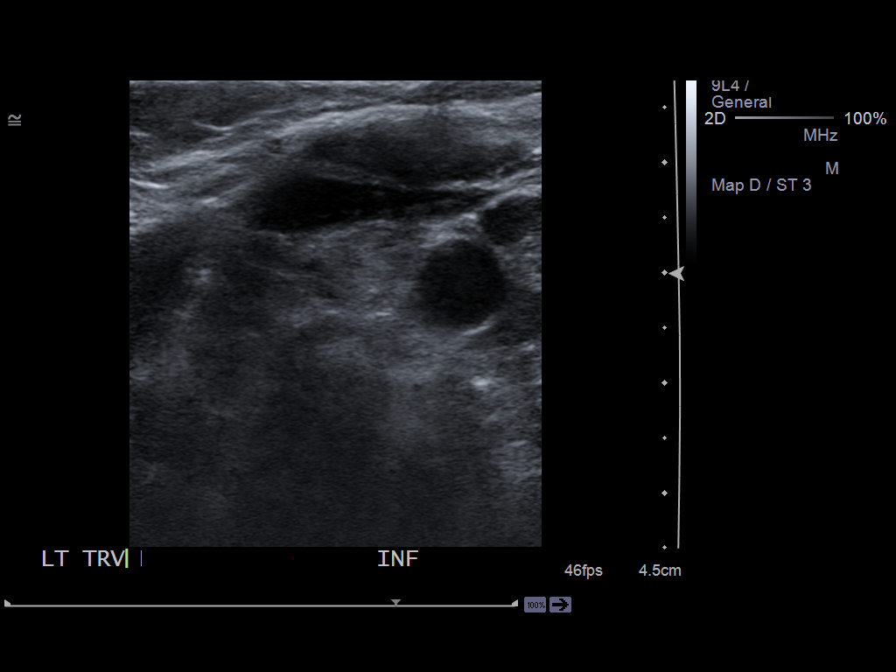
[im 9/9]
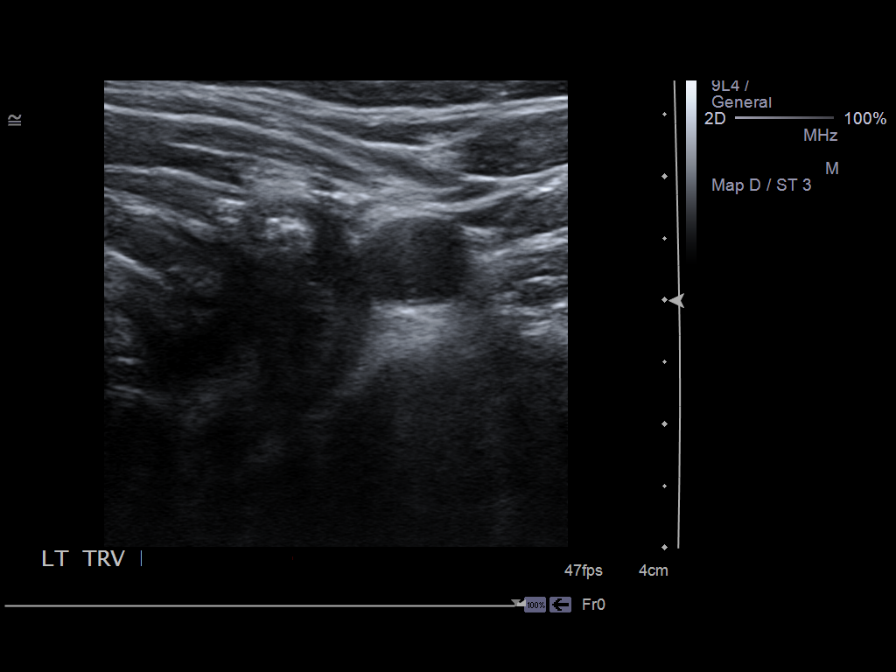

[9 of 9 positions shown; findings below may reference images not displayed]

FINDINGS: Right thyroid lobe:  Approximately 4.6 cm length and 1.7 cm AP.
Left thyroid lobe: Approximately 5.7 cm length by 1.2 cm AP.
Isthmus: Not adequately imaged

Focal nodules:  No gross thyroid nodules are seen on limited
assessment.

Parathyroid regions: At the expected positions of the parathyroid
glands, no definite soft tissue nodules are identified.

Lymphadenopathy:  None identified
IMPRESSION: Grossly unremarkable thyroid gland.
No identification of peri thyroidal nodules are identified to
suggest parathyroid adenomas or enlarged parathyroid glands.
If patient has biochemical evidence indicative of hyper para
hormone excretion, consider radionuclide parathyroid scan in an
attempt to localize hyperfunctioning parathyroid glands/adenomas.

## 2013-05-24 ENCOUNTER — Encounter: Payer: Self-pay | Admitting: Family Medicine

## 2013-05-24 NOTE — Assessment & Plan Note (Signed)
Annual wellness as documented Needs to work on increased physical activity and reduce intake of sweets , fried and fatty foods

## 2013-05-24 NOTE — Assessment & Plan Note (Signed)
No med changes or referral for therapy indicated  Pt encouraged to commit to daily physical activity as well as to become more involved in her community

## 2013-05-28 ENCOUNTER — Encounter: Payer: Self-pay | Admitting: Gastroenterology

## 2013-05-29 IMAGING — CT CT ABD-PELV W/O CM
2 of 4 series · 16 of 46 positions shown, 18 images · non-contrast
Comparison: 01/08/2010.

CLINICAL DATA: Right flank pain.  Nausea and vomiting.  Dizziness.
History of renal stones.

CT ABDOMEN AND PELVIS WITHOUT CONTRAST
TECHNIQUE: Multidetector CT imaging of the abdomen and pelvis was
performed following the standard protocol without intravenous
contrast.

[Series 2: standard/full over (age)lbs 5.0 · axial · 0.81mm/px · z∈[-482,-26]mm · 13 of 99 slices shown, 15 images]
[im 4/99  soft-tissue]
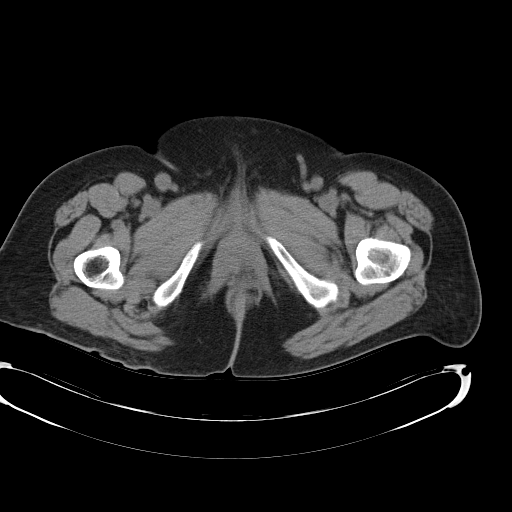
[im 4/99  bone]
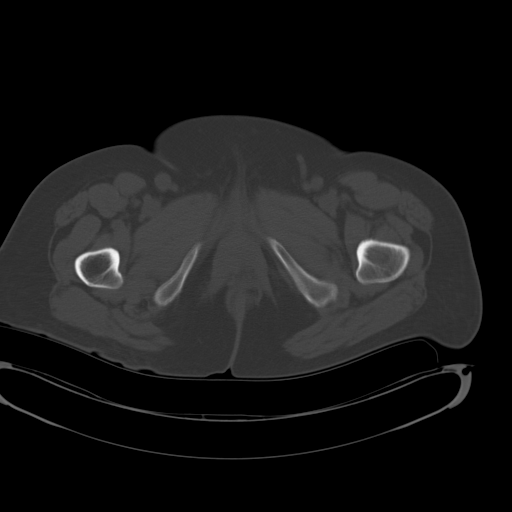
[im 12/99  soft-tissue]
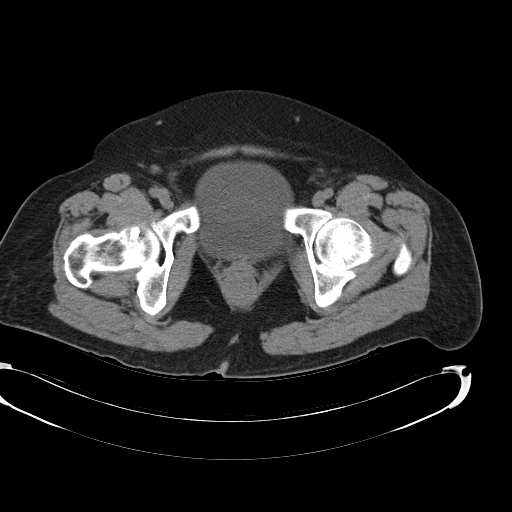
[im 20/99  soft-tissue]
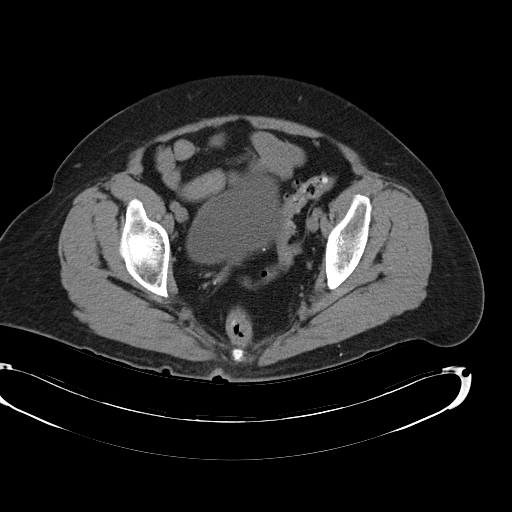
[im 28/99  soft-tissue]
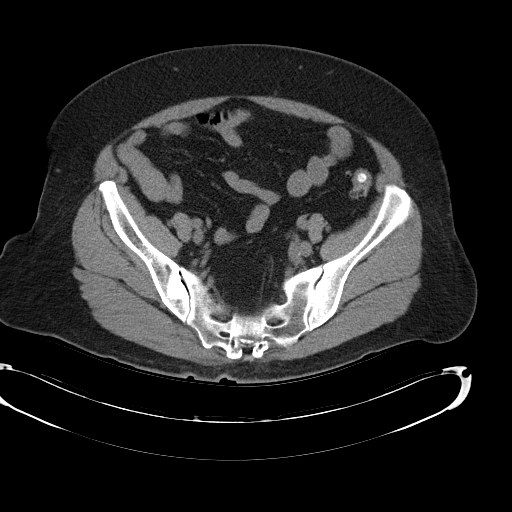
[im 36/99  soft-tissue]
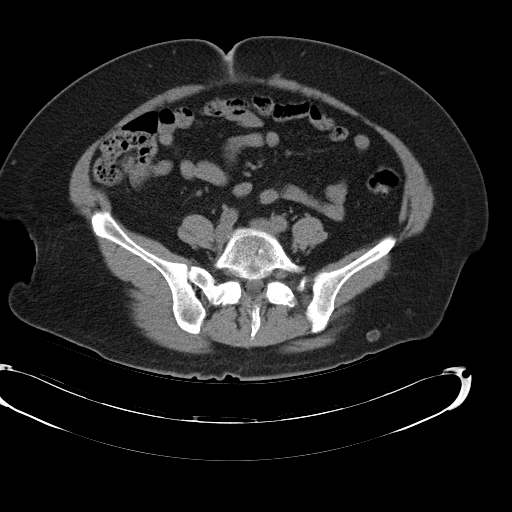
[im 44/99  soft-tissue]
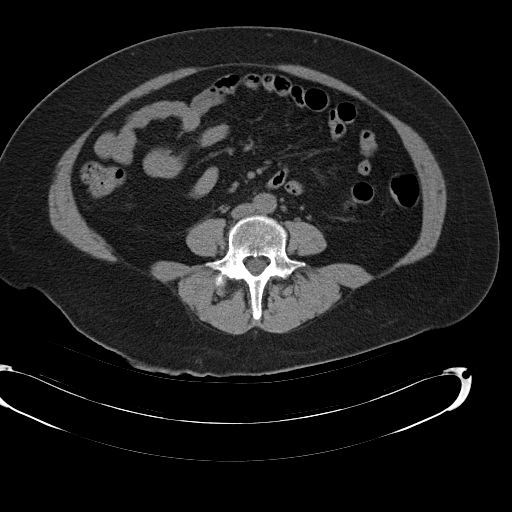
[im 51/99  soft-tissue]
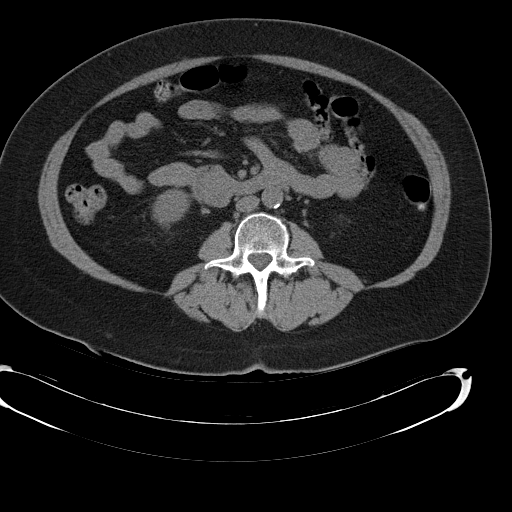
[im 55/99  soft-tissue]
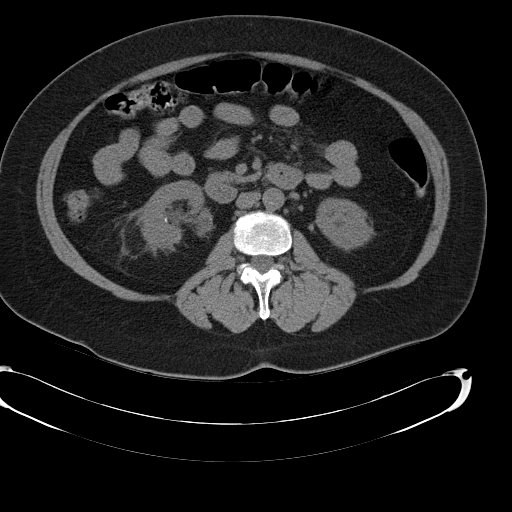
[im 63/99  soft-tissue]
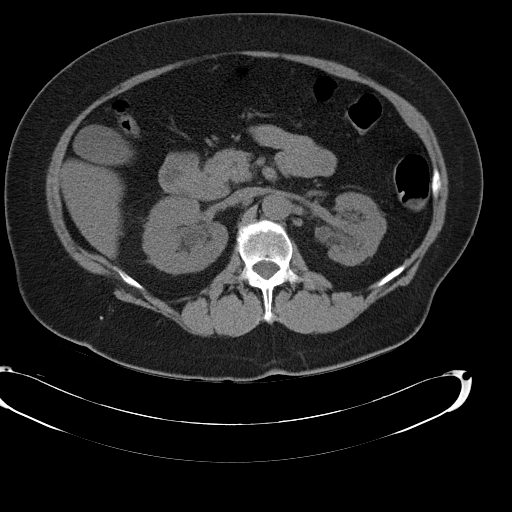
[im 63/99  bone]
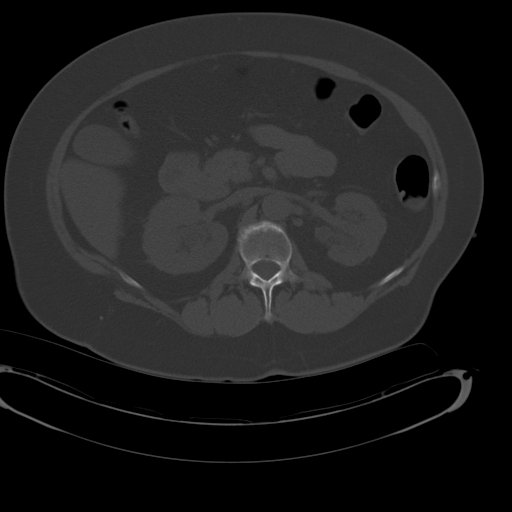
[im 71/99  soft-tissue]
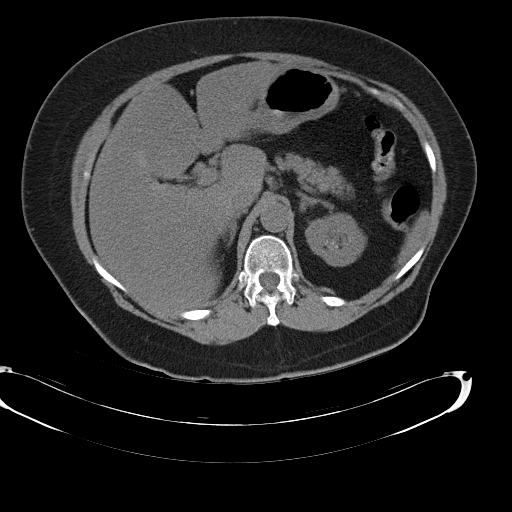
[im 79/99  soft-tissue]
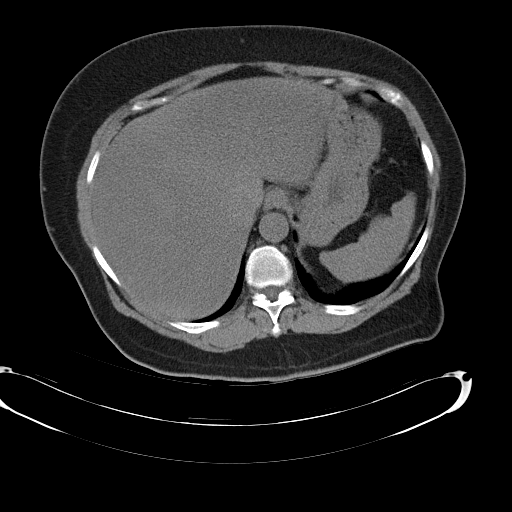
[im 87/99  soft-tissue]
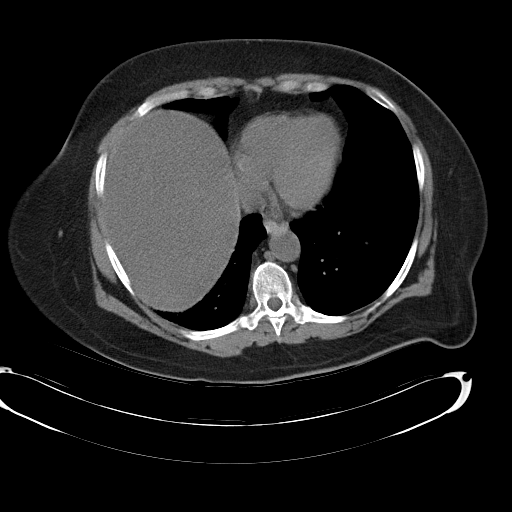
[im 95/99  soft-tissue]
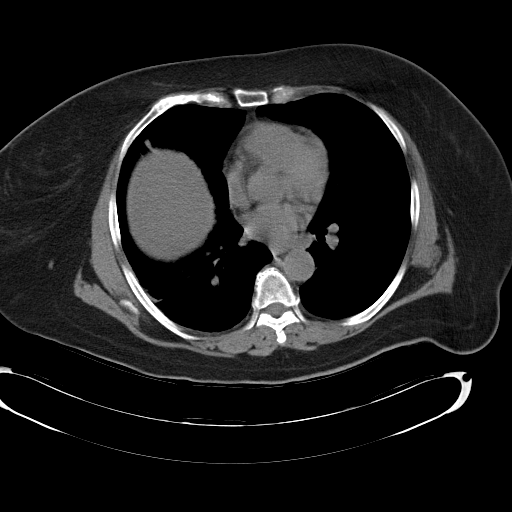

[Series 5: mpr coronal · coronal · 0.77mm/px · 3 of 91 slices shown]
[im 31/91  soft-tissue]
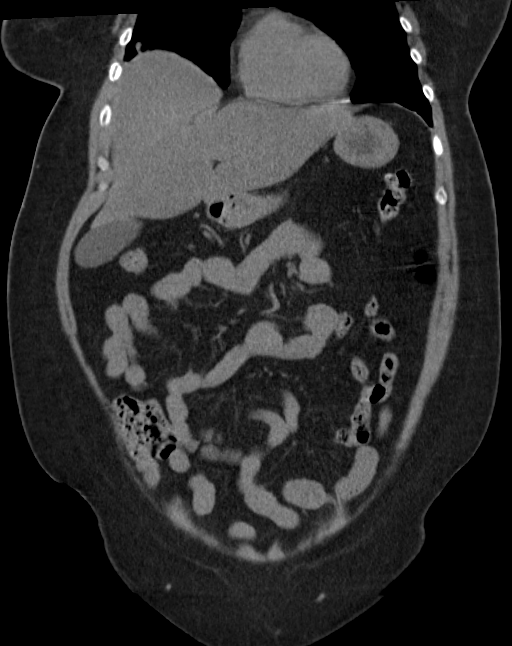
[im 41/91  soft-tissue]
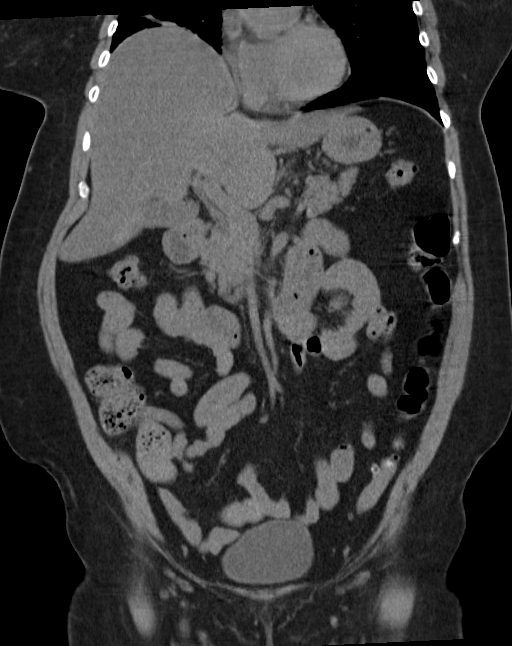
[im 51/91  soft-tissue]
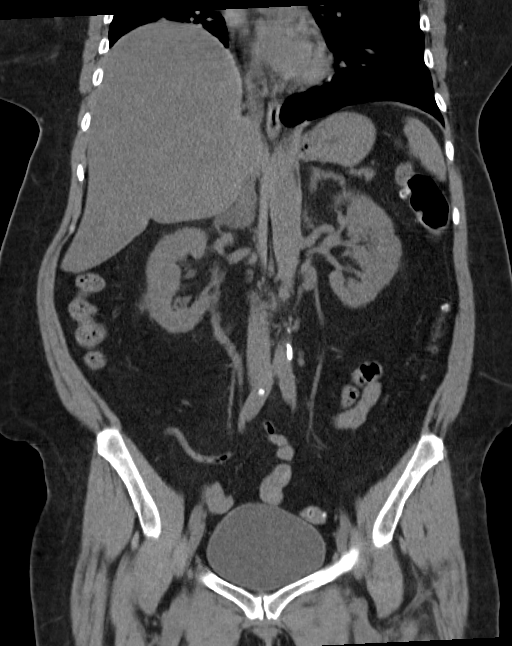

[16 of 46 positions shown; findings below may reference images not displayed]

FINDINGS: Dependent atelectasis and / or scarring is present at the
right lung base.  Fatty liver.  Hepatomegaly.  Focal fatty sparing
adjacent to the gallbladder fossa.  Pancreas grossly normal.
Unenhanced CT was performed per clinician order.  Lack of IV
contrast limits sensitivity and specificity, especially for
evaluation of abdominal/pelvic solid viscera.

Bilateral nonobstructing renal collecting system calculi are
present.  Largest on the right measures 3 mm.  Largest on the left
measures 2 mm.  Perinephric stranding is present on the right and
to a lesser extent on the left.  Ectasia of the right renal pelvis
is present with proximal periureteric stranding.  The findings are
most compatible with ascending urinary tract infection and
pyelonephritis.  No ureteral calculi are identified.  Urinary
bladder is distended and appears within normal limits.

Hysterectomy.  Colon is decompressed with colonic diverticulosis.
No diverticulitis.  Normal appendix.  Lumbar spondylosis. Mild
abdominal aortic atherosclerosis.
IMPRESSION: 1.  Bilateral nonobstructing renal collecting system calculi.  No
ureteral calculi.
2.  Right greater than left perinephric stranding with thickening
of the right renal pelvis, most compatible with ascending urinary
tract infection/pyelonephritis.  Correlation with urinalysis
recommended.
3. Fatty liver and hepatomegaly.

## 2013-06-11 ENCOUNTER — Other Ambulatory Visit: Payer: Self-pay | Admitting: Family Medicine

## 2013-06-15 ENCOUNTER — Other Ambulatory Visit: Payer: Self-pay | Admitting: Family Medicine

## 2013-06-23 IMAGING — NM NM PARATHYOID PLANAR
4 series · 24 of 24 positions shown · non-contrast
Comparison: None

CLINICAL DATA: Hypercalcemia

NUCLEAR MEDICINE PARATHYROID SCAN WITH SPECT IMAGING
TECHNIQUE: After intravenous administration of radiopharmaceutical
images of the neck and mediastinum were obtained at approximately
15 minutes, 60 minutes and 120 minutes.  SPECT imaging performed at
120 minutes.
Radiopharmaceutical: 24.5 mCi Kc-88m Sestamibi

[spect - general rel 180 · 6.4mm · 6.39mm/px · 6 of 45 frames shown (1 of 4)]
[frame 4/45]
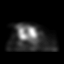
[frame 12/45]
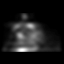
[frame 19/45]
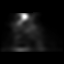
[frame 27/45]
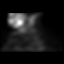
[frame 34/45]
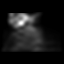
[frame 42/45]
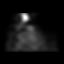

[spect - general rel 180 · 6.4mm · 6.39mm/px · 6 of 45 frames shown (2 of 4)]
[frame 4/45]
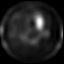
[frame 12/45]
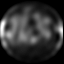
[frame 19/45]
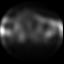
[frame 27/45]
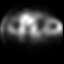
[frame 34/45]
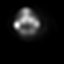
[frame 42/45]
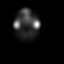

[spect - general rel 180 · 6.4mm · 6.39mm/px · 6 of 45 frames shown (3 of 4)]
[frame 4/45]
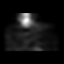
[frame 12/45]
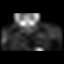
[frame 19/45]
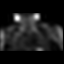
[frame 27/45]
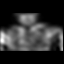
[frame 34/45]
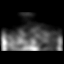
[frame 42/45]
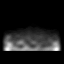

[spect - general rel 180 · 6.39mm/px · 6 of 64 frames shown (4 of 4)]
[frame 6/64]
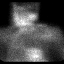
[frame 16/64]
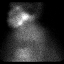
[frame 27/64]
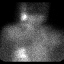
[frame 38/64]
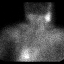
[frame 48/64]
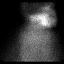
[frame 59/64]
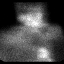

[24 of 24 positions shown; findings below may reference images not displayed]

FINDINGS: Planar images of neck and chest obtained at 15 minutes, 1 hour and
2 hours demonstrate normal tracer distribution.
No abnormal sestamibi localization in neck to suggest parathyroid
adenoma.
No abnormal sestamibi localization in chest to suggest ectopic
parathyroid tumor.
SPECT imaging of neck and chest at 2 hours is normal.
IMPRESSION: Normal exam.
No evidence of hyperfunctioning parathyroid adenoma.

## 2013-06-30 ENCOUNTER — Other Ambulatory Visit: Payer: Self-pay | Admitting: Family Medicine

## 2013-07-08 ENCOUNTER — Telehealth: Payer: Self-pay | Admitting: Family Medicine

## 2013-07-08 MED ORDER — TRAMADOL HCL 50 MG PO TABS: 50.0000 mg | ORAL_TABLET | Freq: Two times a day (BID) | ORAL | Status: DC

## 2013-07-08 NOTE — Telephone Encounter (Signed)
Med printed to be signed by Dr and then will fax to pharmacy

## 2013-07-09 NOTE — Telephone Encounter (Signed)
Refill sent in

## 2013-07-15 ENCOUNTER — Other Ambulatory Visit: Payer: Self-pay | Admitting: Family Medicine

## 2013-07-17 ENCOUNTER — Telehealth: Payer: Self-pay | Admitting: Family Medicine

## 2013-07-17 NOTE — Telephone Encounter (Signed)
Called [facility] myself and both meds have been ready to be collected

## 2013-07-27 IMAGING — CR DG NECK SOFT TISSUE
2 series · 2 of 2 positions shown · non-contrast
Comparison: None.

CLINICAL DATA: Sore throat with difficulty swallowing.

NECK SOFT TISSUES - 2+ VIEW

[view not recorded (1 of 2)]
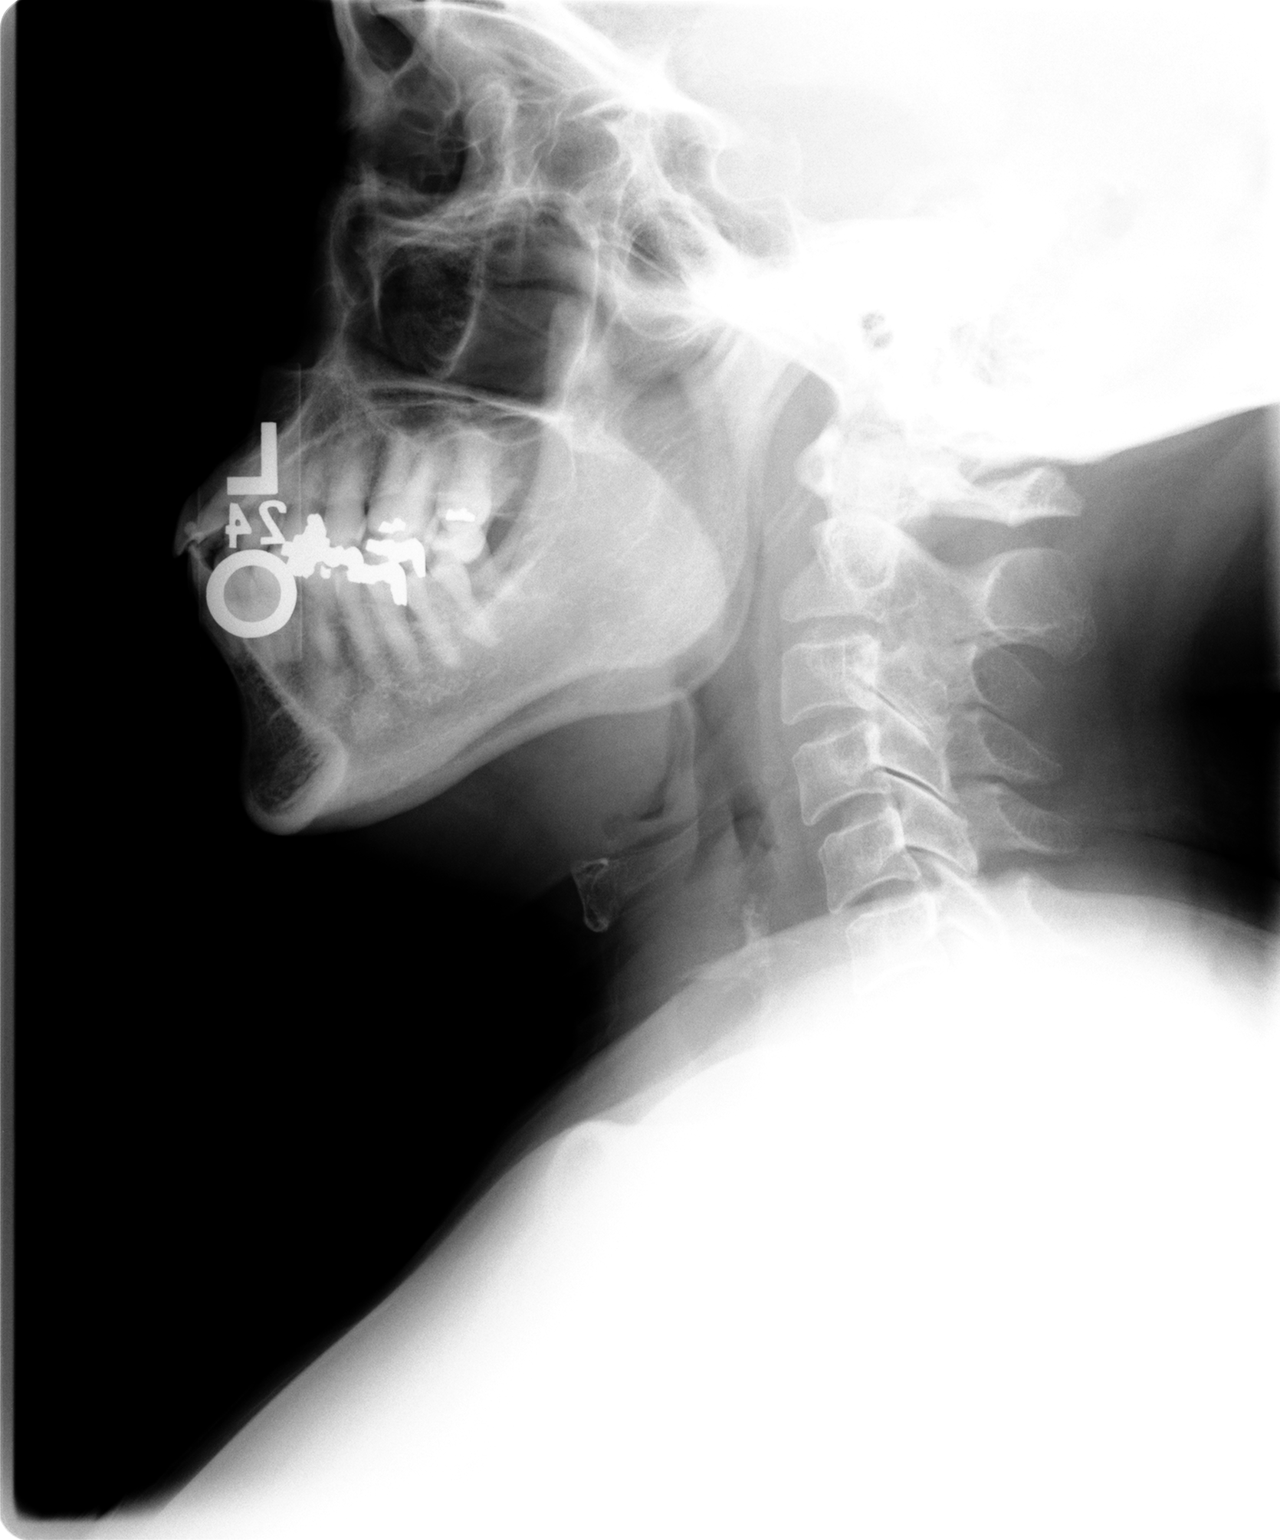

[view not recorded (2 of 2)]
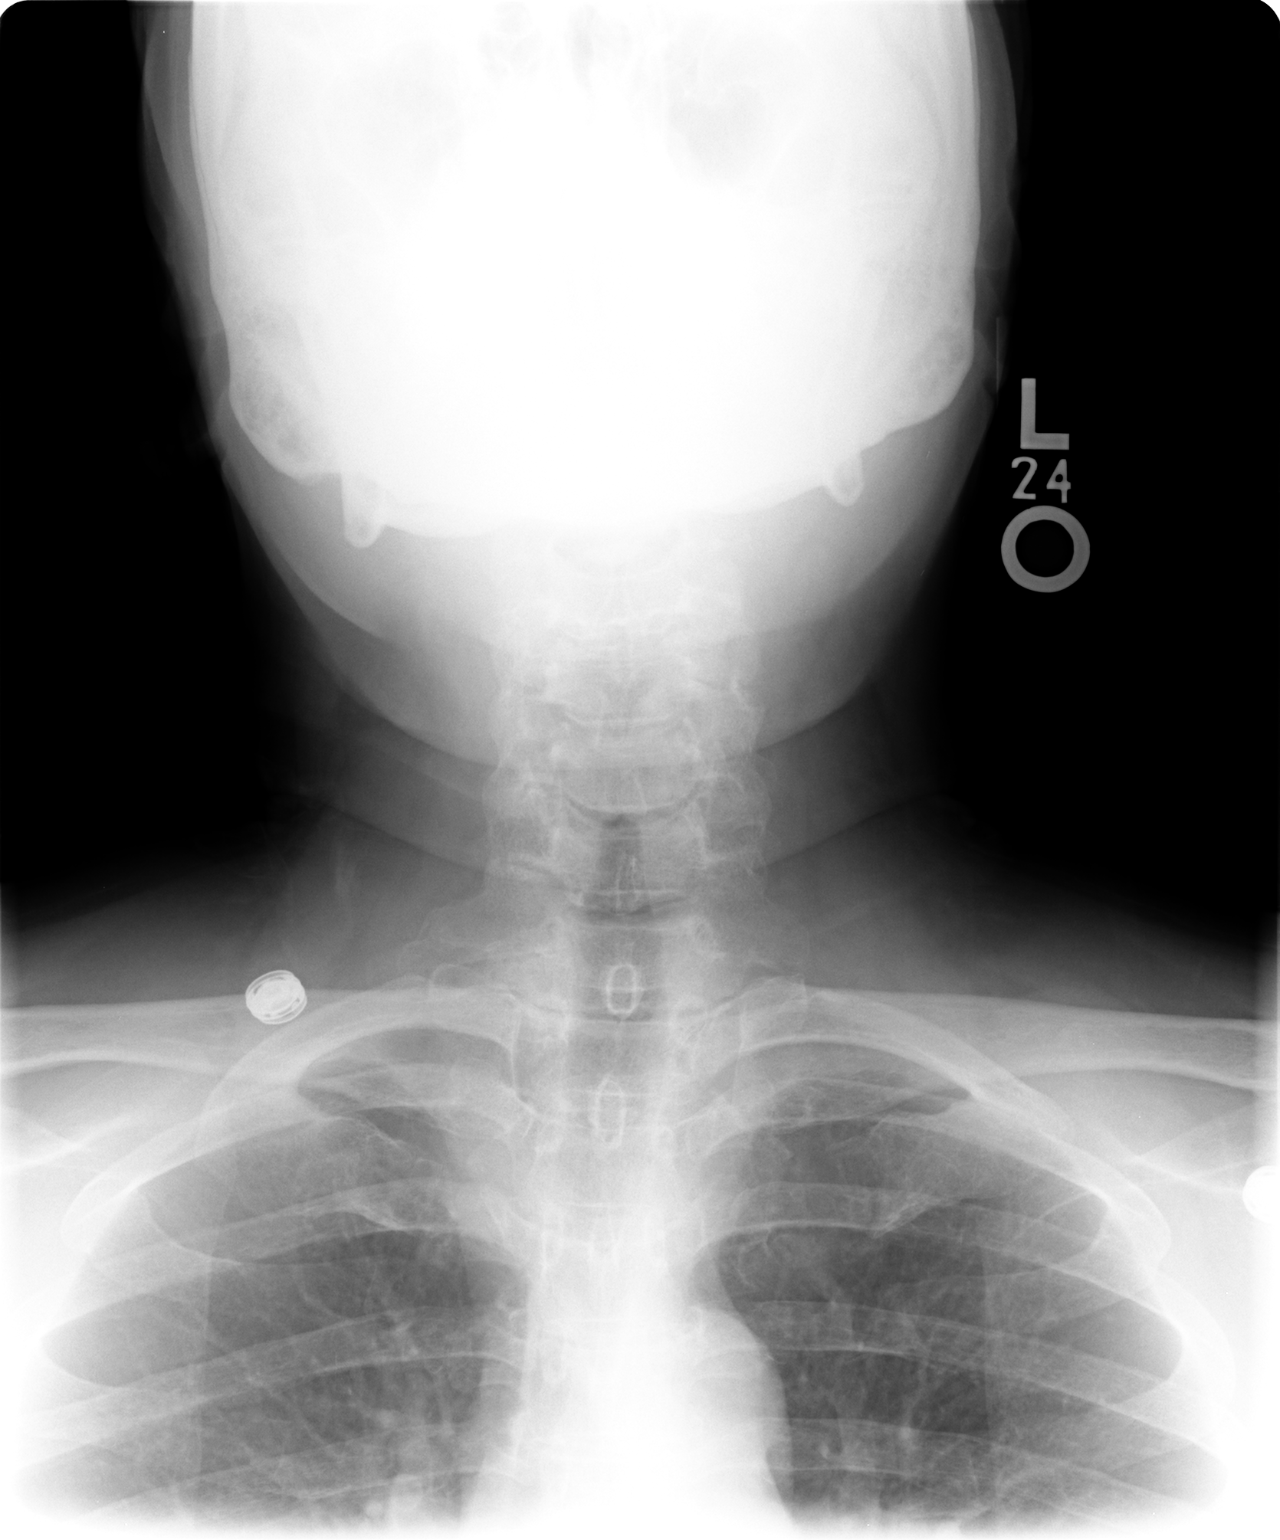

[2 of 2 positions shown; findings below may reference images not displayed]

FINDINGS: There is no evidence of retropharyngeal soft tissue
swelling or epiglottic enlargement.  The cervical airway is
unremarkable and no radio-opaque foreign body identified.
IMPRESSION: Negative.

## 2013-08-25 ENCOUNTER — Other Ambulatory Visit: Payer: Self-pay | Admitting: Family Medicine

## 2013-08-31 IMAGING — MG MM DIGITAL SCREENING {[facility]}
4 series · 4 of 4 positions shown · non-contrast
Comparison: Prior studies.

DG SCREEN MAMMOGRAM BILATERAL
Bilateral CC and MLO view(s) were taken.

DIGITAL SCREENING MAMMOGRAM WITH CAD:

[L CC]
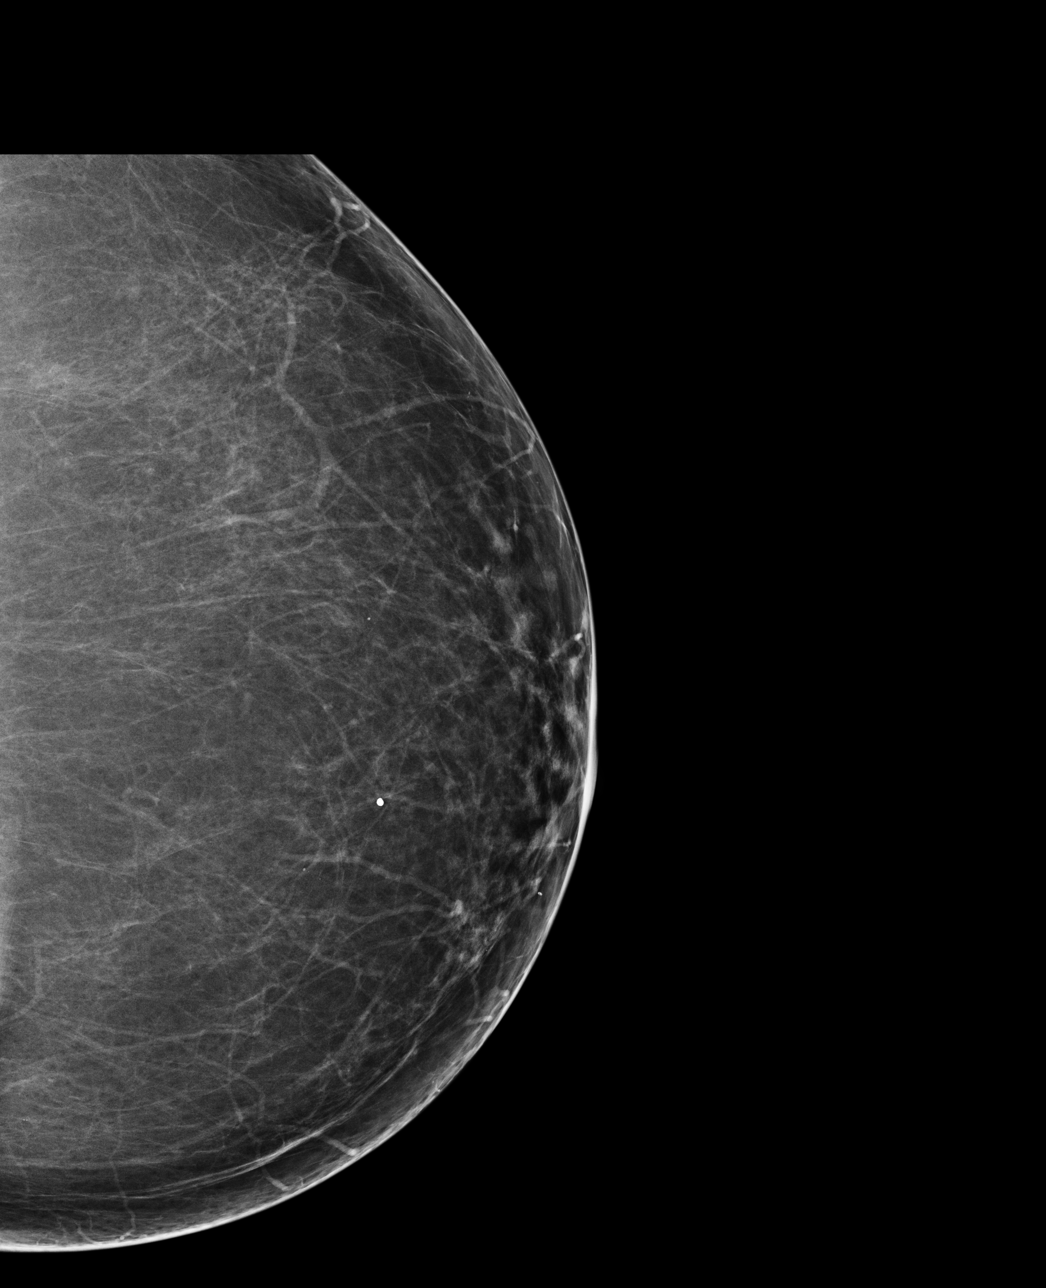

[L MLO]
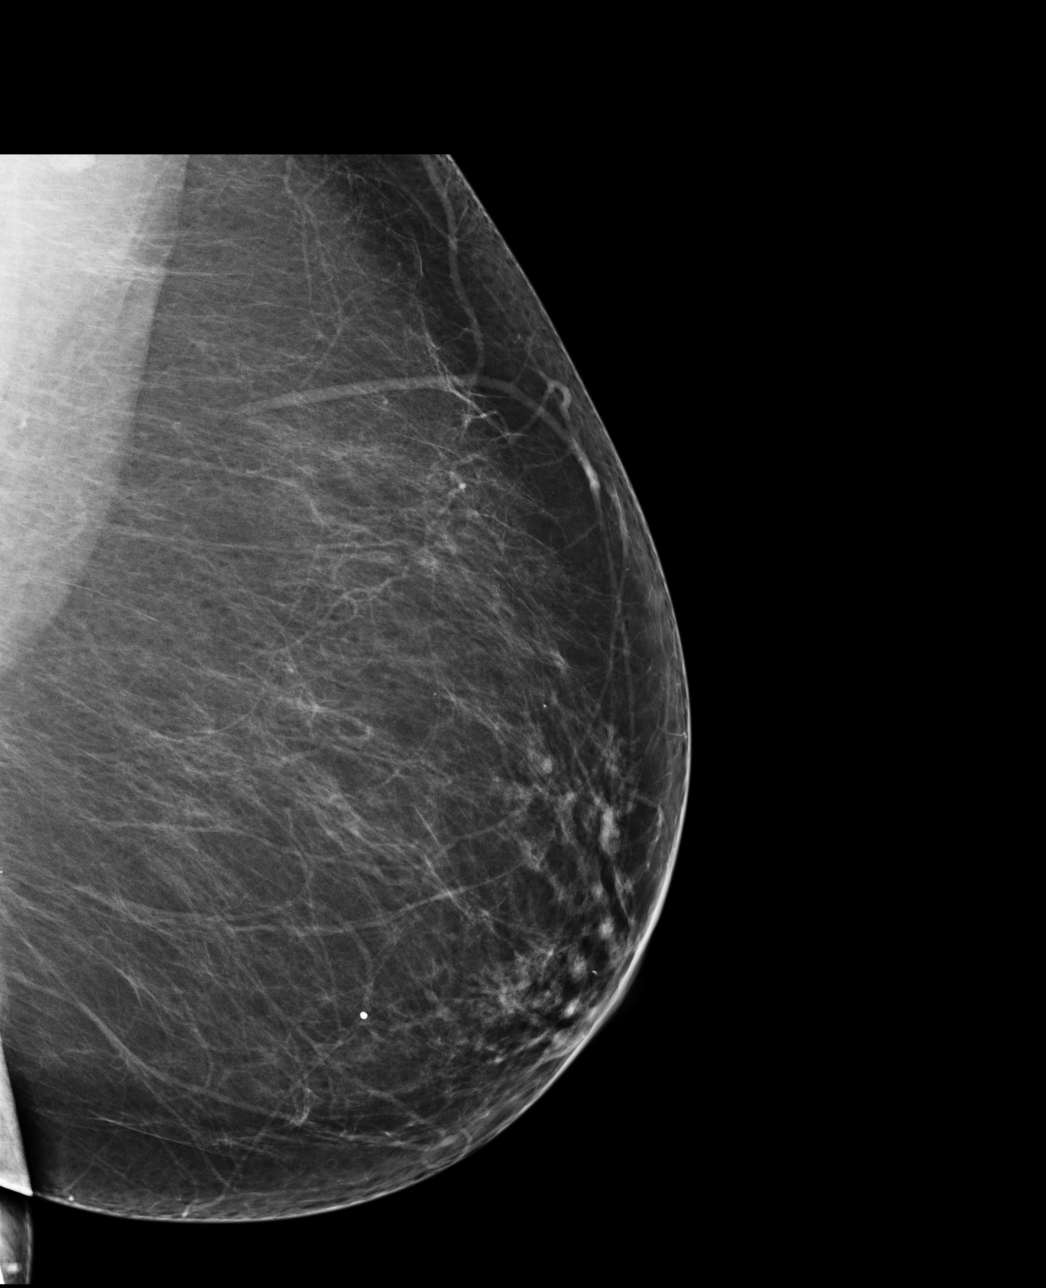

[R CC]
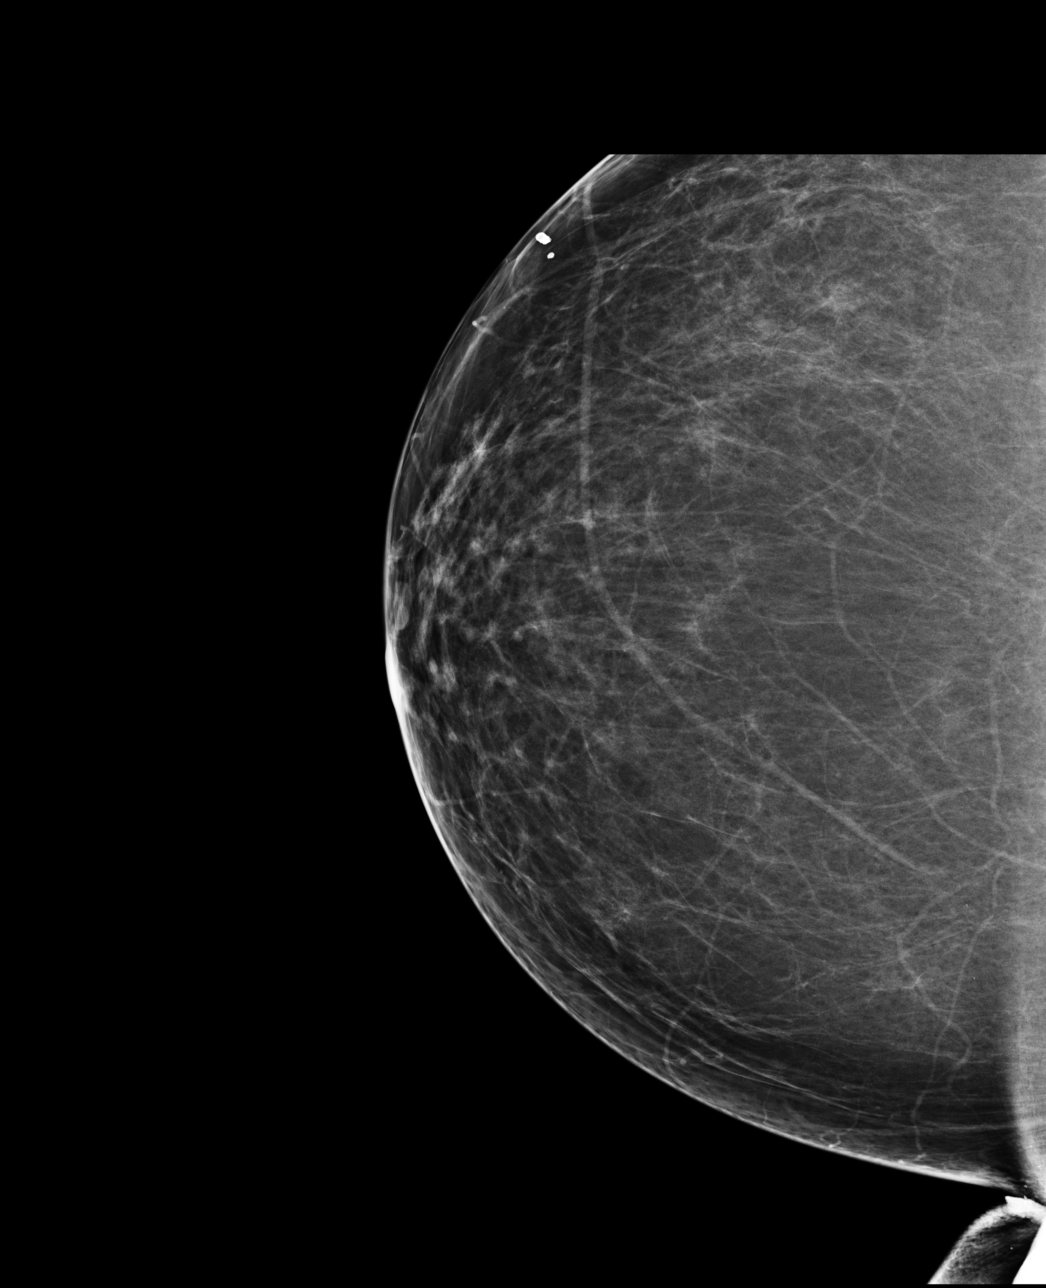

[R MLO]
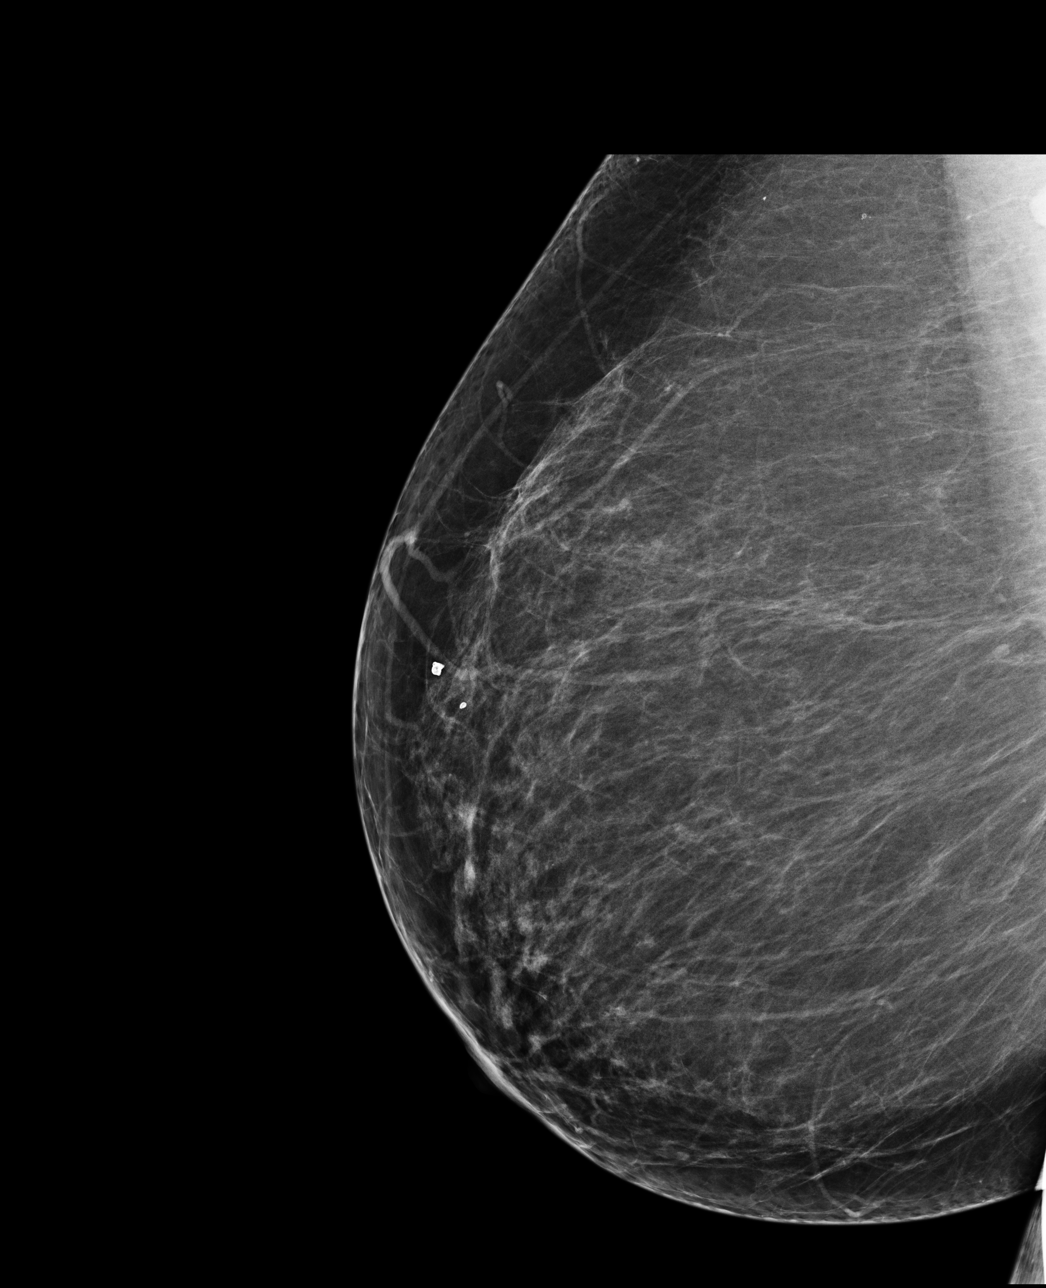

[4 of 4 positions shown; findings below may reference images not displayed]

The breast tissue is almost entirely fatty.  There is no dominant mass, architectural distortion or
calcification to suggest malignancy.

Images were processed with CAD.
IMPRESSION: No mammographic evidence of malignancy.  Suggest yearly screening mammography.

A result letter of this screening mammogram will be mailed directly to the patient.

ASSESSMENT: Negative - BI-RADS 1

Screening mammogram in 1 year.
,

## 2013-09-02 NOTE — Progress Notes (Signed)
EGD/DIL OCT 2014 GASTRITIS

## 2013-09-03 ENCOUNTER — Other Ambulatory Visit: Payer: Self-pay | Admitting: Family Medicine

## 2013-09-09 ENCOUNTER — Other Ambulatory Visit: Payer: Self-pay | Admitting: Family Medicine

## 2013-09-14 ENCOUNTER — Other Ambulatory Visit: Payer: Self-pay | Admitting: Family Medicine

## 2013-09-15 ENCOUNTER — Ambulatory Visit (HOSPITAL_COMMUNITY)
Admission: RE | Admit: 2013-09-15 | Discharge: 2013-09-15 | Disposition: A | Discharge status: Home / Self Care | Payer: Medicare HMO | Source: Ambulatory Visit | Attending: Urology | Admitting: Urology

## 2013-09-15 ENCOUNTER — Other Ambulatory Visit: Payer: Self-pay

## 2013-09-15 ENCOUNTER — Other Ambulatory Visit: Payer: Self-pay | Admitting: Urology

## 2013-09-15 DIAGNOSIS — N200 Calculus of kidney: Secondary | ICD-10-CM

## 2013-09-15 DIAGNOSIS — Z87442 Personal history of urinary calculi: Secondary | ICD-10-CM | POA: Insufficient documentation

## 2013-09-15 DIAGNOSIS — M545 Low back pain, unspecified: Secondary | ICD-10-CM | POA: Insufficient documentation

## 2013-09-15 MED ORDER — METFORMIN HCL 500 MG PO TABS: ORAL_TABLET | ORAL | Status: DC

## 2013-09-15 MED ORDER — LOVASTATIN 40 MG PO TABS: ORAL_TABLET | ORAL | Status: DC

## 2013-09-15 MED ORDER — TRAMADOL HCL 50 MG PO TABS: ORAL_TABLET | ORAL | Status: DC

## 2013-09-15 MED ORDER — IBUPROFEN 800 MG PO TABS: 800.0000 mg | ORAL_TABLET | Freq: Every day | ORAL | Status: DC

## 2013-09-16 LAB — LIPID PANEL
Cholesterol: 144 mg/dL (ref 0–200)
HDL: 42 mg/dL (ref >39)
LDL Cholesterol: 75 mg/dL (ref 0–99)
Total CHOL/HDL Ratio: 3.4 Ratio
Triglycerides: 133 mg/dL (ref <150)
VLDL: 27 mg/dL (ref 0–40)

## 2013-09-16 LAB — HEMOGLOBIN A1C
Hgb A1c MFr Bld: 6.8 % — ABNORMAL HIGH (ref <5.7)
Mean Plasma Glucose: 148 mg/dL — ABNORMAL HIGH (ref <117)

## 2013-09-16 LAB — COMPLETE METABOLIC PANEL WITH GFR
ALT: 20 U/L (ref 0–35)
AST: 19 U/L (ref 0–37)
Albumin: 4.6 g/dL (ref 3.5–5.2)
Alkaline Phosphatase: 94 U/L (ref 39–117)
BUN: 12 mg/dL (ref 6–23)
CO2: 26 mEq/L (ref 19–32)
Calcium: 10.3 mg/dL (ref 8.4–10.5)
Chloride: 100 mEq/L (ref 96–112)
Creat: 1.14 mg/dL — ABNORMAL HIGH (ref 0.50–1.10)
GFR, Est African American: 57 mL/min — ABNORMAL LOW
GFR, Est Non African American: 49 mL/min — ABNORMAL LOW
Glucose, Bld: 102 mg/dL — ABNORMAL HIGH (ref 70–99)
Potassium: 4.8 mEq/L (ref 3.5–5.3)
Sodium: 137 mEq/L (ref 135–145)
Total Bilirubin: 0.3 mg/dL (ref 0.2–1.2)
Total Protein: 8.3 g/dL (ref 6.0–8.3)

## 2013-09-29 ENCOUNTER — Encounter (INDEPENDENT_AMBULATORY_CARE_PROVIDER_SITE_OTHER): Payer: Self-pay

## 2013-09-29 ENCOUNTER — Ambulatory Visit (INDEPENDENT_AMBULATORY_CARE_PROVIDER_SITE_OTHER): Payer: Medicare HMO | Admitting: Family Medicine

## 2013-09-29 ENCOUNTER — Encounter: Payer: Self-pay | Admitting: Family Medicine

## 2013-09-29 VITALS — BP 130/88 | HR 100 | Resp 18 | Ht 67.0 in | Wt 211.0 lb

## 2013-09-29 DIAGNOSIS — F3289 Other specified depressive episodes: Secondary | ICD-10-CM

## 2013-09-29 DIAGNOSIS — F329 Major depressive disorder, single episode, unspecified: Secondary | ICD-10-CM

## 2013-09-29 DIAGNOSIS — N058 Unspecified nephritic syndrome with other morphologic changes: Secondary | ICD-10-CM

## 2013-09-29 DIAGNOSIS — G4730 Sleep apnea, unspecified: Secondary | ICD-10-CM

## 2013-09-29 DIAGNOSIS — F32A Depression, unspecified: Secondary | ICD-10-CM

## 2013-09-29 DIAGNOSIS — E669 Obesity, unspecified: Secondary | ICD-10-CM

## 2013-09-29 DIAGNOSIS — E1129 Type 2 diabetes mellitus with other diabetic kidney complication: Secondary | ICD-10-CM

## 2013-09-29 DIAGNOSIS — I10 Essential (primary) hypertension: Secondary | ICD-10-CM

## 2013-09-29 DIAGNOSIS — E1121 Type 2 diabetes mellitus with diabetic nephropathy: Secondary | ICD-10-CM

## 2013-09-29 DIAGNOSIS — F411 Generalized anxiety disorder: Secondary | ICD-10-CM

## 2013-09-29 MED ORDER — ALPRAZOLAM 0.5 MG PO TABS: ORAL_TABLET | ORAL | Status: DC

## 2013-09-29 MED ORDER — TERBINAFINE HCL 250 MG PO TABS: 250.0000 mg | ORAL_TABLET | Freq: Every day | ORAL | Status: DC

## 2013-09-29 NOTE — Patient Instructions (Addendum)
F/u with rectal in 4.5 month, call if you need me before  You a re referred to Dr Iona Hansen for eye exam  Labs are good no med changes  You are encouraged to increase interactions with friends and family, you do have a lot of gifts.   Medication for fungal foot  infection is sent in (terbinafine tablets only, no cream is necessary)  Urine today for microalb  HBA1C, cmp and EGFr, and TSH in 4 month  Please use all your talent s , cooking, baking, craft to make life more enjoyable and interesting  It is important that you exercise regularly at least 30 minutes 5 times a week. If you develop chest pain, have severe difficulty breathing, or feel very tired, stop exercising immediately and seek medical attention   Weight loss goal of 5 pounds  You need to make arrangements to get your sleep apnea treated, this will improve your health and energy level!

## 2013-10-06 LAB — MICROALBUMIN / CREATININE URINE RATIO
Creatinine, Urine: 121.7 mg/dL
Microalb Creat Ratio: 34.3 mg/g — ABNORMAL HIGH (ref 0.0–30.0)
Microalb, Ur: 4.17 mg/dL — ABNORMAL HIGH (ref 0.00–1.89)

## 2013-10-09 ENCOUNTER — Other Ambulatory Visit: Payer: Self-pay

## 2013-10-09 MED ORDER — METFORMIN HCL 500 MG PO TABS: ORAL_TABLET | ORAL | Status: DC

## 2013-10-09 MED ORDER — PAROXETINE HCL 40 MG PO TABS: ORAL_TABLET | ORAL | Status: DC

## 2013-10-09 MED ORDER — AMLODIPINE BESYLATE 5 MG PO TABS: ORAL_TABLET | ORAL | Status: DC

## 2013-10-09 MED ORDER — LOVASTATIN 40 MG PO TABS: ORAL_TABLET | ORAL | Status: DC

## 2013-10-09 MED ORDER — IBUPROFEN 800 MG PO TABS: ORAL_TABLET | ORAL | Status: DC

## 2013-10-12 ENCOUNTER — Other Ambulatory Visit: Payer: Self-pay

## 2013-10-12 MED ORDER — PANTOPRAZOLE SODIUM 40 MG PO TBEC: 40.0000 mg | DELAYED_RELEASE_TABLET | Freq: Two times a day (BID) | ORAL | Status: DC

## 2013-10-13 ENCOUNTER — Ambulatory Visit (INDEPENDENT_AMBULATORY_CARE_PROVIDER_SITE_OTHER): Payer: Medicare HMO | Admitting: Urology

## 2013-10-13 DIAGNOSIS — N200 Calculus of kidney: Secondary | ICD-10-CM

## 2013-10-21 ENCOUNTER — Ambulatory Visit: Payer: Self-pay | Admitting: Gastroenterology

## 2013-10-23 NOTE — Progress Notes (Signed)
   Subjective:    Patient ID: Cheryl Abbott, female    DOB: February 18, 1946, 68 y.o.   MRN: [id number]  HPI The PT is here for follow up and re-evaluation of chronic medical conditions, medication management and review of any available recent lab and radiology data.  Preventive health is updated, specifically  Cancer screening and Immunization.   Questions or concerns regarding consultations or procedures which the PT has had in the interim are  addressed. The PT denies any adverse reactions to current medications since the last visit.  There are no new concerns.  There are no specific complaints  Denies polyuria, polydipsia or blurred vision, denies hypoglycemic episodes, FBG averages around 130      Review of Systems See HPI Denies recent fever or chills. Denies sinus pressure, nasal congestion, ear pain or sore throat. Denies chest congestion, productive cough or wheezing. Denies chest pains, palpitations and leg swelling Denies abdominal pain, nausea, vomiting,diarrhea or constipation.   Denies dysuria, frequency, hesitancy or incontinence. Denies joint pain, swelling and limitation in mobility. Denies headaches, seizures, numbness, or tingling. Denies uncontrolled  depression, anxiety or insomnia. Denies skin break down or rash.        Objective:   Physical Exam  BP 130/88  Pulse 100  Resp 18  Ht 5\' 7"  (1.702 m)  Wt 211 lb (95.709 kg)  BMI 33.04 kg/m2  SpO2 92% Patient alert and oriented and in no cardiopulmonary distress.  HEENT: No facial asymmetry, EOMI, no sinus tenderness,  oropharynx pink and moist.  Neck supple no adenopathy.  Chest: Clear to auscultation bilaterally.  CVS: S1, S2 no murmurs, no S3.  ABD: Soft non tender. Bowel sounds normal.  Ext: No edema  MS: Adequate ROM spine, shoulders, hips and knees.  Skin: Intact, no ulcerations or rash noted.  Psych: Good eye contact, normal affect. Memory intact not anxious or depressed  appearing.  CNS: CN 2-12 intact, power, tone and sensation normal throughout.       Assessment & Plan:  Hypertension goal BP (blood pressure) < 130/80 Slightly elevated , needs to work on lifestyle change, no med change DASH diet and commitment to daily physical activity for a minimum of 30 minutes discussed and encouraged, as a part of hypertension management. The importance of attaining a healthy weight is also discussed.   Type 2 diabetes with nephropathy Controlled, no change in medication Patient advised to reduce carb and sweets, commit to regular physical activity, take meds as prescribed, test blood as directed, and attempt to lose weight, to improve blood sugar control.   ANXIETY DISORDER Controlled, no change in medication Encouraged to work on increased interaction with friends and family and more use of her natural gifts  Depression Improved and stable no med change Daily physical activity encouraged to be a part of her management    SLEEP APNEA Needs to be treated, has debt to pay off and is working on this so she can get the CPAP equipment that she needs  OBESITY, UNSPECIFIED Deteriorated. Patient re-educated about  the importance of commitment to a  minimum of 150 minutes of exercise per week. The importance of healthy food choices with portion control discussed. Encouraged to start a food diary, count calories and to consider  joining a support group. Sample diet sheets offered. Goals set by the patient for the next several months.

## 2013-10-23 NOTE — Assessment & Plan Note (Signed)
Controlled, no change in medication Encouraged to work on increased interaction with friends and family and more use of her natural gifts

## 2013-10-23 NOTE — Assessment & Plan Note (Signed)
Deteriorated. Patient re-educated about  the importance of commitment to a  minimum of 150 minutes of exercise per week. The importance of healthy food choices with portion control discussed. Encouraged to start a food diary, count calories and to consider  joining a support group. Sample diet sheets offered. Goals set by the patient for the next several months.    

## 2013-10-23 NOTE — Assessment & Plan Note (Signed)
Slightly elevated , needs to work on lifestyle change, no med change DASH diet and commitment to daily physical activity for a minimum of 30 minutes discussed and encouraged, as a part of hypertension management. The importance of attaining a healthy weight is also discussed.

## 2013-10-23 NOTE — Assessment & Plan Note (Signed)
Needs to be treated, has debt to pay off and is working on this so she can get the CPAP equipment that she needs

## 2013-10-23 NOTE — Assessment & Plan Note (Signed)
Improved and stable no med change Daily physical activity encouraged to be a part of her management

## 2013-10-23 NOTE — Assessment & Plan Note (Signed)
Controlled, no change in medication Patient advised to reduce carb and sweets, commit to regular physical activity, take meds as prescribed, test blood as directed, and attempt to lose weight, to improve blood sugar control.  

## 2013-10-27 ENCOUNTER — Other Ambulatory Visit: Payer: Self-pay | Admitting: Family Medicine

## 2013-11-12 ENCOUNTER — Encounter: Payer: Self-pay | Admitting: Gastroenterology

## 2013-11-12 ENCOUNTER — Encounter (INDEPENDENT_AMBULATORY_CARE_PROVIDER_SITE_OTHER): Payer: Self-pay

## 2013-11-12 ENCOUNTER — Ambulatory Visit (INDEPENDENT_AMBULATORY_CARE_PROVIDER_SITE_OTHER): Payer: Medicare HMO | Admitting: Gastroenterology

## 2013-11-12 VITALS — BP 118/82 | HR 98 | Temp 97.8°F | Ht 66.5 in | Wt 209.4 lb

## 2013-11-12 DIAGNOSIS — K219 Gastro-esophageal reflux disease without esophagitis: Secondary | ICD-10-CM

## 2013-11-12 DIAGNOSIS — K7689 Other specified diseases of liver: Secondary | ICD-10-CM

## 2013-11-12 DIAGNOSIS — D649 Anemia, unspecified: Secondary | ICD-10-CM

## 2013-11-12 DIAGNOSIS — R1013 Epigastric pain: Secondary | ICD-10-CM

## 2013-11-12 DIAGNOSIS — K3189 Other diseases of stomach and duodenum: Secondary | ICD-10-CM

## 2013-11-12 DIAGNOSIS — R1310 Dysphagia, unspecified: Secondary | ICD-10-CM

## 2013-11-12 NOTE — Assessment & Plan Note (Addendum)
GAINED 3 LBS. LAST HFP NL.  CONTINUE YOUR WEIGHT LOSS EFFORTS. CONTINUE TO MONITOR. ENCOURAGED WEIGHT LOSS. DISCUSSED BENEFITS OF WEIGHT LOSS.

## 2013-11-12 NOTE — Assessment & Plan Note (Signed)
SX RESOLVED.

## 2013-11-12 NOTE — Patient Instructions (Signed)
CONTINUE YOUR WEIGHT LOSS EFFORTS. LOSE 10 LBS.  FOLLOW A HIGH FIBER/LOW FAT/DIABETIC DIET.   CONTINUE PROTONIX. TAKE 30 MINUTES PRIOR TO MEALS TWICE DAILY.  FOLLOW UP IN 6 MOS.

## 2013-11-12 NOTE — Assessment & Plan Note (Addendum)
SX IMPROVED AFTER EGD/DIL.  CONTINUE TO MONITOR SYMPTOMS. FOLLOW UP IN 6 MOS.

## 2013-11-12 NOTE — Progress Notes (Signed)
Subjective:    Patient ID: Cheryl Abbott, female    DOB: 03/24/1946, 68 y.o.   MRN: [id number]  Tula Nakayama, MD  HPI Swallowing a lot better. CAN GET STRANGLED ON LIQUIDS <1-2X/WEEK. GAINED 3 LBS SINCE JUN 2014. LOVES SWEETS AND MILK. IF GETS ANXIOUS WHEN SHE EATS, FOOD COMES BACK/NAUSEA AND SHE THROWS UP. BMs: DAILY, SOFT. MAY USE STOOL SOFTENER. PT DENIES FEVER, CHILLS, BRBPR, nausea, melena, diarrhea, abd pain, problems swallowing, problems with sedation, OR heartburn or indigestion.  Past Medical History  Diagnosis Date  . Anxiety disorder   . Diabetes mellitus type 1   . Hyperlipidemia   . Hypertension   . Sleep apnea   . Disease of vein   . Coronary artery disease   . Complication of anesthesia     pt states she was not given enough medication with  tcs with Dr Tamala Julian                                    last [place].as able to feel and hear everything.  . Depression   . DEMENTIA   . Renal insufficiency   . Kidney stones   . DDD (degenerative disc disease), lumbar   . Sciatica   . Chronic back pain   . Chronic shoulder pain   . Headache    Past Surgical History  Procedure Laterality Date  . Partial hysterectomy    . Lithotryspy and stent replacement rt ureter  10/2004    12/2009 Dr, Michela Pitcher   . Removal of lump from posterior r thigh    . Colonoscopy  08/21/06    adenomatous polyps/  . Cardiac catheterization  2005  . Flexible sigmoidoscopy  09/11/2011    [id number] Internal hemorrhoids  . Extraction of left kidney stone    . Bilateral stone removal with nephrostomy at [facility]    . Colonoscopy  10/02/2011    [id number] SIGMOID COLON DIVERTICULOSIS/hemorrhoids  . Esophagogastroduodenoscopy (egd) with propofol N/A 03/17/2013    Procedure: ESOPHAGOGASTRODUODENOSCOPY (EGD) WITH PROPOFOL;  Surgeon: Danie Binder, MD;  Location: [facility];  Service: Endoscopy;  Laterality: N/A;  . Savory dilation N/A 03/17/2013    Procedure: SAVORY DILATION;  Surgeon: Danie Binder, MD;  Location: [facility];  Service: Endoscopy;  Laterality: N/A;  #12.8, 14, 15, 16 dilators used  . Esophageal biopsy N/A 03/17/2013    Procedure: GASTRIC BIOPSIES;  Surgeon: Danie Binder, MD;  Location: [facility];  Service: Endoscopy;  Laterality: N/A;  . Polypectomy N/A 03/17/2013    Procedure: GASTRIC POLYPECTOMY;  Surgeon: Danie Binder, MD;  Location: [facility];  Service: Endoscopy;  Laterality: N/A;  . Abdominal hysterectomy      Allergies  Allergen Reactions  . Ace Inhibitors Cough  . Keflex [Cephalexin] Diarrhea and Nausea And Vomiting  . Nitrofurantoin Diarrhea and Nausea And Vomiting  . Penicillins Hives, Rash and Other (See Comments)    Blisters on hands and feet    Current Outpatient Prescriptions  Medication Sig Dispense Refill  . ALPRAZolam (XANAX) 0.5 MG tablet TAKE 1 TABLET BY MOUTH TWICE A DAY    . amLODipine (NORVASC) 5 MG tablet TAKE 1 TABLET (5 MG TOTAL) BY MOUTH DAILY.    Marland Kitchen aspirin EC 81 MG tablet Take 1 tablet (81 mg total) by mouth daily.    . diphenhydramine-acetaminophen (TYLENOL PM) 25-500 MG TABS Take 2 tablets by mouth at  bedtime as needed (sleep/pain).    Marland Kitchen ibuprofen (ADVIL,MOTRIN) 800 MG tablet TAKE 1 TABLET (800 MG TOTAL) BY MOUTH DAILY. FOR PAIN    . lovastatin (MEVACOR) 40 MG tablet TAKE 2 TABLETS BY MOUTH AT BEDTIME    . metFORMIN (GLUCOPHAGE) 500 MG tablet TAKE 2 TABLETS (1,000 MG TOTAL) BY MOUTH 2 (TWO) TIMES DAILY WITH A MEAL.    . pantoprazole (PROTONIX) 40 MG tablet Take 1 tablet (40 mg total) by mouth 2 (two) times daily.    Marland Kitchen PARoxetine (PAXIL) 40 MG tablet TAKE 1 TABLET BY MOUTH EVERY MORNING    . potassium citrate (UROCIT-K) 10 MEQ (1080 MG) SR tablet TAKE 1 TABLET 3 TIMES DAILY WITH MEALS.    . traMADol (ULTRAM) 50 MG tablet TAKE 1 TABLET BY MOUTH TWICE A DAY    . naproxen sodium (ANAPROX) 220 MG tablet Take 220 mg by mouth daily as needed (headache).    . terbinafine (LAMISIL) 250 MG tablet Take 1 tablet (250 mg total) by mouth daily.        Review of Systems     Objective:   Physical Exam  Vitals reviewed. Constitutional: She is oriented to person, place, and time. She appears well-nourished. No distress.  HENT:  Head: Normocephalic and atraumatic.  Mouth/Throat: Oropharynx is clear and moist. No oropharyngeal exudate.  Eyes: Pupils are equal, round, and reactive to light. No scleral icterus.  Neck: Normal range of motion. Neck supple.  Cardiovascular: Normal rate, regular rhythm and normal heart sounds.   Pulmonary/Chest: Effort normal and breath sounds normal. No respiratory distress.  Abdominal: Soft. Bowel sounds are normal. She exhibits no distension. There is no tenderness.  Musculoskeletal: She exhibits no edema.  Lymphadenopathy:    She has no cervical adenopathy.  Neurological: She is alert and oriented to person, place, and time.  NO  NEW FOCAL DEFICITS   Psychiatric: She has a normal mood and affect.          Assessment & Plan:

## 2013-11-12 NOTE — Assessment & Plan Note (Signed)
SX CONTROLLED.  CONTINUE YOUR WEIGHT LOSS EFFORTS. LOW FAT/DIABETIC DIET OPV IN 6 MOS

## 2013-11-16 NOTE — Progress Notes (Signed)
cc'd to pcp 

## 2013-11-17 NOTE — Progress Notes (Signed)
Reminder in epic °

## 2013-11-26 ENCOUNTER — Emergency Department (HOSPITAL_COMMUNITY): Payer: Medicare HMO

## 2013-11-26 ENCOUNTER — Emergency Department (HOSPITAL_COMMUNITY)
Admission: EM | Admit: 2013-11-26 | Discharge: 2013-11-26 | Disposition: A | Discharge status: Home / Self Care | Payer: Medicare HMO | Attending: Emergency Medicine | Admitting: Emergency Medicine

## 2013-11-26 ENCOUNTER — Encounter (HOSPITAL_COMMUNITY): Payer: Self-pay | Admitting: Emergency Medicine

## 2013-11-26 DIAGNOSIS — F411 Generalized anxiety disorder: Secondary | ICD-10-CM | POA: Insufficient documentation

## 2013-11-26 DIAGNOSIS — I10 Essential (primary) hypertension: Secondary | ICD-10-CM | POA: Insufficient documentation

## 2013-11-26 DIAGNOSIS — Z8739 Personal history of other diseases of the musculoskeletal system and connective tissue: Secondary | ICD-10-CM | POA: Insufficient documentation

## 2013-11-26 DIAGNOSIS — R079 Chest pain, unspecified: Secondary | ICD-10-CM

## 2013-11-26 DIAGNOSIS — Z880 Allergy status to penicillin: Secondary | ICD-10-CM | POA: Insufficient documentation

## 2013-11-26 DIAGNOSIS — Z87891 Personal history of nicotine dependence: Secondary | ICD-10-CM | POA: Insufficient documentation

## 2013-11-26 DIAGNOSIS — Z9889 Other specified postprocedural states: Secondary | ICD-10-CM | POA: Insufficient documentation

## 2013-11-26 DIAGNOSIS — F0390 Unspecified dementia without behavioral disturbance: Secondary | ICD-10-CM | POA: Insufficient documentation

## 2013-11-26 DIAGNOSIS — F3289 Other specified depressive episodes: Secondary | ICD-10-CM | POA: Insufficient documentation

## 2013-11-26 DIAGNOSIS — F329 Major depressive disorder, single episode, unspecified: Secondary | ICD-10-CM | POA: Insufficient documentation

## 2013-11-26 DIAGNOSIS — E109 Type 1 diabetes mellitus without complications: Secondary | ICD-10-CM | POA: Insufficient documentation

## 2013-11-26 DIAGNOSIS — Z87442 Personal history of urinary calculi: Secondary | ICD-10-CM | POA: Insufficient documentation

## 2013-11-26 DIAGNOSIS — I2510 Atherosclerotic heart disease of native coronary artery without angina pectoris: Secondary | ICD-10-CM | POA: Insufficient documentation

## 2013-11-26 DIAGNOSIS — G8929 Other chronic pain: Secondary | ICD-10-CM | POA: Insufficient documentation

## 2013-11-26 DIAGNOSIS — Z87448 Personal history of other diseases of urinary system: Secondary | ICD-10-CM | POA: Insufficient documentation

## 2013-11-26 DIAGNOSIS — R0602 Shortness of breath: Secondary | ICD-10-CM | POA: Insufficient documentation

## 2013-11-26 DIAGNOSIS — E785 Hyperlipidemia, unspecified: Secondary | ICD-10-CM | POA: Insufficient documentation

## 2013-11-26 DIAGNOSIS — Z862 Personal history of diseases of the blood and blood-forming organs and certain disorders involving the immune mechanism: Secondary | ICD-10-CM | POA: Insufficient documentation

## 2013-11-26 DIAGNOSIS — Z79899 Other long term (current) drug therapy: Secondary | ICD-10-CM | POA: Insufficient documentation

## 2013-11-26 LAB — COMPREHENSIVE METABOLIC PANEL
ALT: 20 U/L (ref 0–35)
AST: 20 U/L (ref 0–37)
Albumin: 3.9 g/dL (ref 3.5–5.2)
Alkaline Phosphatase: 98 U/L (ref 39–117)
BUN: 19 mg/dL (ref 6–23)
CO2: 25 mEq/L (ref 19–32)
Calcium: 10.6 mg/dL — ABNORMAL HIGH (ref 8.4–10.5)
Chloride: 98 mEq/L (ref 96–112)
Creatinine, Ser: 1.15 mg/dL — ABNORMAL HIGH (ref 0.50–1.10)
GFR calc Af Amer: 55 mL/min — ABNORMAL LOW (ref >90)
GFR calc non Af Amer: 47 mL/min — ABNORMAL LOW (ref >90)
Glucose, Bld: 96 mg/dL (ref 70–99)
Potassium: 4.5 mEq/L (ref 3.7–5.3)
Sodium: 138 mEq/L (ref 137–147)
Total Bilirubin: <0.2 mg/dL — ABNORMAL LOW (ref 0.3–1.2)
Total Protein: 8.6 g/dL — ABNORMAL HIGH (ref 6.0–8.3)

## 2013-11-26 LAB — CBC WITH DIFFERENTIAL/PLATELET
Basophils Absolute: 0 10*3/uL (ref 0.0–0.1)
Basophils Relative: 1 % (ref 0–1)
Eosinophils Absolute: 0.1 10*3/uL (ref 0.0–0.7)
Eosinophils Relative: 2 % (ref 0–5)
HCT: 37.6 % (ref 36.0–46.0)
Hemoglobin: 12.4 g/dL (ref 12.0–15.0)
Lymphocytes Relative: 30 % (ref 12–46)
Lymphs Abs: 2.1 10*3/uL (ref 0.7–4.0)
MCH: 28.1 pg (ref 26.0–34.0)
MCHC: 33 g/dL (ref 30.0–36.0)
MCV: 85.3 fL (ref 78.0–100.0)
Monocytes Absolute: 1 10*3/uL (ref 0.1–1.0)
Monocytes Relative: 14 % — ABNORMAL HIGH (ref 3–12)
Neutro Abs: 3.8 10*3/uL (ref 1.7–7.7)
Neutrophils Relative %: 53 % (ref 43–77)
Platelets: 363 10*3/uL (ref 150–400)
RBC: 4.41 MIL/uL (ref 3.87–5.11)
RDW: 16.8 % — ABNORMAL HIGH (ref 11.5–15.5)
WBC: 7.1 10*3/uL (ref 4.0–10.5)

## 2013-11-26 LAB — PRO B NATRIURETIC PEPTIDE: Pro B Natriuretic peptide (BNP): 20.9 pg/mL (ref 0–125)

## 2013-11-26 LAB — TROPONIN I: Troponin I: <0.3 ng/mL (ref <0.30)

## 2013-11-26 MED ORDER — ASPIRIN 81 MG PO CHEW
324.0000 mg | CHEWABLE_TABLET | Freq: Once | ORAL | Status: AC
  Administered 2013-11-26: 324 mg via ORAL
  Filled 2013-11-26: qty 4

## 2013-11-26 NOTE — ED Notes (Signed)
Per patient intermittent chest pain that starts mid-sternal and radiates under right breast and into side. Patient reports shortness of breath and nausea. Per patient typically starts after eating.

## 2013-11-26 NOTE — ED Notes (Signed)
Get SOB when going outside, walking up steps, and any activity.  Usually last about 30 minutes. States,  "Each time it feels my heart is racing".  Usually see "silver floaters.  Last week had dizziness and SOB and on Monday had another episode with same symptoms.

## 2013-11-26 NOTE — Discharge Instructions (Signed)
Today's workup for the chest pain was negative. Return for any new or worse symptoms. Make an appointment to followup with Dr. Moshe Cipro.

## 2013-11-26 NOTE — ED Provider Notes (Signed)
CSN: [id number]     Arrival date & time 11/26/13  1425 History  This chart was scribed for Fredia Sorrow, MD by Ludger Nutting, ED Scribe. This patient was seen in room [facility] and the patient's care was started 2:53 PM.    Chief Complaint  Patient presents with  . Chest Pain   The history is provided by the patient. No language interpreter was used.   HPI Comments: FLORETTE COLAIANNI is a 68 y.o. female with past medical history of DM, HLD, HTN, CAD who presents to the Emergency Department complaining of 2-3 episodes of intermittent, non-radiating chest pain that lasts for 15-20 minutes at a time. She states the most recent episode occurred today and the one prior occurred 1-2 days ago. She states the pain is only present when she is exerting herself. She describes the pain as tightness and rates it as 7/10. She states the episode that occurred today has resolved and she is currently pain-free. She denies SOB, nausea, diaphoresis.    Past Medical History  Diagnosis Date  . Anxiety disorder   . Diabetes mellitus type 1   . Hyperlipidemia   . Hypertension   . Sleep apnea   . Disease of vein   . Coronary artery disease   . Complication of anesthesia     pt states she was not given enough medication with  tcs with Dr Tamala Julian                                    last [place].as able to feel and hear everything.  . Depression   . DEMENTIA   . Renal insufficiency   . Kidney stones   . DDD (degenerative disc disease), lumbar   . Sciatica   . Chronic back pain   . Chronic shoulder pain   . Headache   . ANEMIA 09/08/2009    DEC 2014 Hb 13.5     Past Surgical History  Procedure Laterality Date  . Partial hysterectomy    . Lithotryspy and stent replacement rt ureter  10/2004    12/2009 Dr, Michela Pitcher   . Removal of lump from posterior r thigh    . Colonoscopy  08/21/06    adenomatous polyps/  . Cardiac catheterization  2005  . Flexible sigmoidoscopy  09/11/2011    [id number] Internal hemorrhoids   . Extraction of left kidney stone    . Bilateral stone removal with nephrostomy at [facility]    . Colonoscopy  10/02/2011    [id number] SIGMOID COLON DIVERTICULOSIS/hemorrhoids  . Esophagogastroduodenoscopy (egd) with propofol N/A 03/17/2013    Procedure: ESOPHAGOGASTRODUODENOSCOPY (EGD) WITH PROPOFOL;  Surgeon: Danie Binder, MD;  Location: [facility];  Service: Endoscopy;  Laterality: N/A;  . Savory dilation N/A 03/17/2013    Procedure: SAVORY DILATION;  Surgeon: Danie Binder, MD;  Location: [facility];  Service: Endoscopy;  Laterality: N/A;  #12.8, 14, 15, 16 dilators used  . Esophageal biopsy N/A 03/17/2013    Procedure: GASTRIC BIOPSIES;  Surgeon: Danie Binder, MD;  Location: [facility];  Service: Endoscopy;  Laterality: N/A;  . Polypectomy N/A 03/17/2013    Procedure: GASTRIC POLYPECTOMY;  Surgeon: Danie Binder, MD;  Location: [facility];  Service: Endoscopy;  Laterality: N/A;  . Abdominal hysterectomy     Family History  Problem Relation Age of Onset  . Hypertension Mother   . Diabetes Mother   . Heart failure  Mother   . Dementia Mother   . Emphysema Father   . Hypertension Father   . Diabetes Brother   . GER disease Brother   . Cancer      family history   . Diabetes      family history   . Heart defect      famiily history   . Arthritis      family history   . Anesthesia problems Neg Hx   . Hypotension Neg Hx   . Malignant hyperthermia Neg Hx   . Pseudochol deficiency Neg Hx   . Colon cancer Neg Hx    History  Substance Use Topics  . Smoking status: Former Smoker -- 1.00 packs/day for 20 years    Types: Cigarettes    Quit date: 09/21/1994  . Smokeless tobacco: Never Used  . Alcohol Use: No   OB History   Grav Para Term Preterm Abortions TAB SAB Ect Mult Living   2 2 2       2      Review of Systems  Constitutional: Negative for fever, chills and diaphoresis.  HENT: Negative for rhinorrhea and sore throat.   Eyes: Positive for visual disturbance (blurry  vision).  Respiratory: Positive for cough. Negative for shortness of breath.   Cardiovascular: Positive for chest pain. Negative for leg swelling.  Gastrointestinal: Positive for abdominal pain. Negative for nausea, vomiting and diarrhea.  Genitourinary: Negative for dysuria.  Musculoskeletal: Negative for back pain and neck pain.  Skin: Negative for rash.  Neurological: Negative for headaches.  Hematological: Does not bruise/bleed easily.  Psychiatric/Behavioral: Negative for confusion.      Allergies  Ace inhibitors; Keflex; Nitrofurantoin; and Penicillins  Home Medications   Prior to Admission medications   Medication Sig Start Date End Date Taking? Authorizing Provider  ALPRAZolam Duanne Moron) 0.5 MG tablet TAKE 1 TABLET BY MOUTH TWICE A DAY 09/29/13  Yes [name], MD  amLODipine (NORVASC) 5 MG tablet TAKE 1 TABLET (5 MG TOTAL) BY MOUTH DAILY. 10/09/13  Yes [name], MD  diphenhydramine-acetaminophen (TYLENOL PM) 25-500 MG TABS Take 2 tablets by mouth at bedtime as needed (sleep/pain).   Yes Historical Provider, MD  ibuprofen (ADVIL,MOTRIN) 800 MG tablet TAKE 1 TABLET (800 MG TOTAL) BY MOUTH DAILY. FOR PAIN 10/09/13  Yes [name], MD  lovastatin (MEVACOR) 40 MG tablet TAKE 2 TABLETS BY MOUTH AT BEDTIME 10/09/13  Yes [name], MD  metFORMIN (GLUCOPHAGE) 500 MG tablet TAKE 2 TABLETS (1,000 MG TOTAL) BY MOUTH 2 (TWO) TIMES DAILY WITH A MEAL. 10/09/13  Yes [name], MD  pantoprazole (PROTONIX) 40 MG tablet Take 1 tablet (40 mg total) by mouth 2 (two) times daily. 10/12/13  Yes Orvil Feil, NP  PARoxetine (PAXIL) 40 MG tablet TAKE 1 TABLET BY MOUTH EVERY MORNING 10/09/13  Yes [name], MD  potassium citrate (UROCIT-K) 10 MEQ (1080 MG) SR tablet TAKE 1 TABLET 3 TIMES DAILY WITH MEALS. 10/27/13  Yes [name], MD  traMADol (ULTRAM) 50 MG tablet TAKE 1 TABLET BY MOUTH TWICE A DAY 09/15/13  Yes [name], MD   BP 165/92  Pulse 111   Temp(Src) 98.1 F (36.7 C) (Oral)  Resp 20  Ht 5\' 6"  (1.676 m)  Wt 209 lb (94.802 kg)  BMI 33.75 kg/m2  SpO2 98% Physical Exam  Nursing note and vitals reviewed. Constitutional: She is oriented to person, place, and time. She appears well-developed and well-nourished.  HENT:  Head: Normocephalic and atraumatic.  Eyes: Conjunctivae and EOM are normal.  Neck: Normal range of motion. Neck supple.  Cardiovascular: Normal rate, regular rhythm and normal heart sounds.   Pulmonary/Chest: Effort normal and breath sounds normal. No respiratory distress. She has no wheezes. She has no rales.  Abdominal: Soft. Bowel sounds are normal. She exhibits no distension. There is no tenderness. There is no rebound and no guarding.  Musculoskeletal: Normal range of motion. She exhibits no edema and no tenderness.  Neurological: She is alert and oriented to person, place, and time. No cranial nerve deficit. Coordination normal.  Skin: Skin is warm and dry.  Psychiatric: She has a normal mood and affect.    ED Course  Procedures (including critical care time)  DIAGNOSTIC STUDIES: Oxygen Saturation is 98% on RA, normal by my interpretation.    COORDINATION OF CARE: 3:03 PM Discussed treatment plan with pt at bedside and pt agreed to plan.   Labs Review Labs Reviewed  CBC WITH DIFFERENTIAL - Abnormal; Notable for the following:    RDW 16.8 (*)    Monocytes Relative 14 (*)    All other components within normal limits  COMPREHENSIVE METABOLIC PANEL - Abnormal; Notable for the following:    Creatinine, Ser 1.15 (*)    Calcium 10.6 (*)    Total Protein 8.6 (*)    Total Bilirubin <0.2 (*)    GFR calc non Af Amer 47 (*)    GFR calc Af Amer 55 (*)    All other components within normal limits  TROPONIN I  PRO B NATRIURETIC PEPTIDE   Results for orders placed during the hospital encounter of 11/26/13  TROPONIN I      Result Value Ref Range   Troponin I <0.30  <0.30 ng/mL  PRO B NATRIURETIC PEPTIDE       Result Value Ref Range   Pro B Natriuretic peptide (BNP) 20.9  0 - 125 pg/mL  CBC WITH DIFFERENTIAL      Result Value Ref Range   WBC 7.1  4.0 - 10.5 K/uL   RBC 4.41  3.87 - 5.11 MIL/uL   Hemoglobin 12.4  12.0 - 15.0 g/dL   HCT 37.6  36.0 - 46.0 %   MCV 85.3  78.0 - 100.0 fL   MCH 28.1  26.0 - 34.0 pg   MCHC 33.0  30.0 - 36.0 g/dL   RDW 16.8 (*) 11.5 - 15.5 %   Platelets 363  150 - 400 K/uL   Neutrophils Relative % 53  43 - 77 %   Neutro Abs 3.8  1.7 - 7.7 K/uL   Lymphocytes Relative 30  12 - 46 %   Lymphs Abs 2.1  0.7 - 4.0 K/uL   Monocytes Relative 14 (*) 3 - 12 %   Monocytes Absolute 1.0  0.1 - 1.0 K/uL   Eosinophils Relative 2  0 - 5 %   Eosinophils Absolute 0.1  0.0 - 0.7 K/uL   Basophils Relative 1  0 - 1 %   Basophils Absolute 0.0  0.0 - 0.1 K/uL  COMPREHENSIVE METABOLIC PANEL      Result Value Ref Range   Sodium 138  137 - 147 mEq/L   Potassium 4.5  3.7 - 5.3 mEq/L   Chloride 98  96 - 112 mEq/L   CO2 25  19 - 32 mEq/L   Glucose, Bld 96  70 - 99 mg/dL   BUN 19  6 - 23 mg/dL   Creatinine, Ser  1.15 (*) 0.50 - 1.10 mg/dL   Calcium 10.6 (*) 8.4 - 10.5 mg/dL   Total Protein 8.6 (*) 6.0 - 8.3 g/dL   Albumin 3.9  3.5 - 5.2 g/dL   AST 20  0 - 37 U/L   ALT 20  0 - 35 U/L   Alkaline Phosphatase 98  39 - 117 U/L   Total Bilirubin <0.2 (*) 0.3 - 1.2 mg/dL   GFR calc non Af Amer 47 (*) >90 mL/min   GFR calc Af Amer 55 (*) >90 mL/min     Imaging Review Dg Chest 2 View  11/26/2013   CLINICAL DATA:  Chest pain on exertion  EXAM: CHEST  2 VIEW  COMPARISON:  Portable chest x-ray of Oct 25, 2012.  FINDINGS: The lungs are adequately inflated. There is chronic mild elevation of the right hemidiaphragm. The heart and pulmonary vascularity are normal. There is no pleural effusion. The thoracic spine exhibits mild degenerative change.  IMPRESSION: There is no acute cardiopulmonary abnormality.   Electronically Signed   By: David  Martinique   On: 11/26/2013 16:05     EKG  Interpretation None          Date: 11/26/2013  Rate: 108  Rhythm: sinus tachycardia  QRS Axis: normal  Intervals: normal  ST/T Wave abnormalities: normal  Conduction Disutrbances:none  Narrative Interpretation:   Old EKG Reviewed: none available   MDM   Final diagnoses:  Chest pain, unspecified chest pain type  Shortness of breath    Today's workup for the chest pain and shortness of breath without any significant findings. Chest x-rays negative for pneumonia or pulmonary edema or pneumothorax. Troponin was negative. EKG without acute changes. Patient's been having chest pain on and off in the midsternal area for months. A little more concerned about some increased pain on Tuesday and today. Patient had a cardiac cath back in 2005 does not have stents. Patient currently only followed by Dr. Moshe Cipro not followed by cardiology. But as stated patient's been having this pain long-term no significant change. Today.  I personally performed the services described in this documentation, which was scribed in my presence. The recorded information has been reviewed and is accurate.    Fredia Sorrow, MD 11/26/13 [phone]

## 2013-12-21 ENCOUNTER — Encounter (INDEPENDENT_AMBULATORY_CARE_PROVIDER_SITE_OTHER): Payer: Self-pay

## 2013-12-21 ENCOUNTER — Ambulatory Visit (INDEPENDENT_AMBULATORY_CARE_PROVIDER_SITE_OTHER): Payer: Medicare HMO | Admitting: Family Medicine

## 2013-12-21 ENCOUNTER — Encounter: Payer: Self-pay | Admitting: Family Medicine

## 2013-12-21 VITALS — BP 118/82 | HR 99 | Resp 16 | Ht 67.0 in | Wt 206.0 lb

## 2013-12-21 DIAGNOSIS — E1121 Type 2 diabetes mellitus with diabetic nephropathy: Secondary | ICD-10-CM

## 2013-12-21 DIAGNOSIS — I10 Essential (primary) hypertension: Secondary | ICD-10-CM

## 2013-12-21 DIAGNOSIS — M899 Disorder of bone, unspecified: Secondary | ICD-10-CM

## 2013-12-21 DIAGNOSIS — Z23 Encounter for immunization: Secondary | ICD-10-CM

## 2013-12-21 DIAGNOSIS — M949 Disorder of cartilage, unspecified: Secondary | ICD-10-CM

## 2013-12-21 DIAGNOSIS — R0602 Shortness of breath: Secondary | ICD-10-CM

## 2013-12-21 DIAGNOSIS — E669 Obesity, unspecified: Secondary | ICD-10-CM

## 2013-12-21 DIAGNOSIS — E1129 Type 2 diabetes mellitus with other diabetic kidney complication: Secondary | ICD-10-CM

## 2013-12-21 DIAGNOSIS — R0789 Other chest pain: Secondary | ICD-10-CM

## 2013-12-21 DIAGNOSIS — F32A Depression, unspecified: Secondary | ICD-10-CM

## 2013-12-21 DIAGNOSIS — F329 Major depressive disorder, single episode, unspecified: Secondary | ICD-10-CM

## 2013-12-21 DIAGNOSIS — N058 Unspecified nephritic syndrome with other morphologic changes: Secondary | ICD-10-CM

## 2013-12-21 DIAGNOSIS — F3289 Other specified depressive episodes: Secondary | ICD-10-CM

## 2013-12-21 NOTE — Patient Instructions (Addendum)
Annual physical  exam in 4 month, call if you need me before  D dimer , HBA1C, vit d and TSH  You will be referred to cardiology once your labs from today are reviewed  Please schedule appt with Dr Merlene Laughter for sleep eval also your eye exam

## 2013-12-21 NOTE — Progress Notes (Signed)
   Subjective:    Patient ID: Cheryl Abbott, female    DOB: May 02, 1946, 68 y.o.   MRN: [id number]  HPI Pt in for f/u from recent ED vist in mid June when she presented with chest pain and she c/o increased exertional d SOB over the past 1 month and associated with diaphoresis Pain in her chest is localized to left , non radiaiting not associated with nausea ,  or light headedness,  She denies productive cough or hemoptysis, no recent fever or chills. Denies polyuria, polydipsia or hypoglycemic episodes Denies uncontrolled depression. Still needs to get her sleep equipment , needs re evaluation   Review of Systems See HPI Denies recent fever or chills. Denies sinus pressure, nasal congestion, ear pain or sore throat. Denies chest congestion, productive cough or wheezing. Denies PND, orthopnea, palpitations and leg swelling Denies abdominal pain, nausea, vomiting,diarrhea or constipation.   Denies dysuria, frequency, hesitancy or incontinence. Denies joint pain, swelling and limitation in mobility. Denies headaches, seizures, numbness, or tingling. Denies uncontrolled  depression, anxiety or insomnia. Denies skin break down or rash.        Objective:   Physical Exam BP 118/82  Pulse 99  Resp 16  Ht 5\' 7"  (1.702 m)  Wt 206 lb (93.441 kg)  BMI 32.26 kg/m2  SpO2 92% Patient alert and oriented and in no cardiopulmonary distress.  HEENT: No facial asymmetry, EOMI,   oropharynx pink and moist.  Neck supple no JVD, no mass.  Chest: Clear to auscultation bilaterally.  CVS: S1, S2 no murmurs, no S3.Regular rate.  ABD: Soft non tender.   Ext: No edema  MS: Adequate ROM spine, shoulders, hips and knees.  Skin: Intact, no ulcerations or rash noted.  Psych: Good eye contact, normal affect. Memory intact not anxious or depressed appearing.  CNS: CN 2-12 intact, power,  normal throughout.no focal deficits noted.        Assessment & Plan:  Atypical chest pain 1 month  h/o intermittent chest pain with increased shortness of breath with activity and diaphoresis. Multiple cardiac risk factors. D dimer is negative, will refer to cardiology  Hypertension goal BP (blood pressure) < 130/80 Controlled, no change in medication DASH diet and commitment to daily physical activity for a minimum of 30 minutes discussed and encouraged, as a part of hypertension management. The importance of attaining a healthy weight is also discussed.   Type 2 diabetes with nephropathy Controlled, no change in medication Patient advised to reduce carb and sweets, commit to regular physical activity, take meds as prescribed, test blood as directed, and attempt to lose weight, to improve blood sugar control.   Depression Controlled, no change in medication   Obesity (BMI 30.0-34.9) Deteriorated. Patient re-educated about  the importance of commitment to a  minimum of 150 minutes of exercise per week. The importance of healthy food choices with portion control discussed. Encouraged to start a food diary, count calories and to consider  joining a support group. Sample diet sheets offered. Goals set by the patient for the next several months.

## 2013-12-22 ENCOUNTER — Telehealth: Payer: Self-pay | Admitting: Family Medicine

## 2013-12-22 DIAGNOSIS — R0789 Other chest pain: Secondary | ICD-10-CM | POA: Insufficient documentation

## 2013-12-22 LAB — D-DIMER, QUANTITATIVE: D-Dimer, Quant: 0.29 ug/mL-FEU (ref 0.00–0.48)

## 2013-12-22 LAB — HEMOGLOBIN A1C
Hgb A1c MFr Bld: 6.5 % — ABNORMAL HIGH (ref <5.7)
Mean Plasma Glucose: 140 mg/dL — ABNORMAL HIGH (ref <117)

## 2013-12-22 LAB — VITAMIN D 25 HYDROXY (VIT D DEFICIENCY, FRACTURES): Vit D, 25-Hydroxy: 23 ng/mL — ABNORMAL LOW (ref 30–89)

## 2013-12-22 LAB — TSH: TSH: 2.072 u[IU]/mL (ref 0.350–4.500)

## 2013-12-22 MED ORDER — VITAMIN D (ERGOCALCIFEROL) 1.25 MG (50000 UNIT) PO CAPS: 50000.0000 [IU] | ORAL_CAPSULE | ORAL | Status: DC

## 2013-12-22 NOTE — Telephone Encounter (Signed)
Called pt back and she is aware of her lab results

## 2013-12-22 NOTE — Assessment & Plan Note (Signed)
1 month h/o intermittent chest pain with increased shortness of breath with activity and diaphoresis. Multiple cardiac risk factors. D dimer is negative, will refer to cardiology

## 2013-12-23 ENCOUNTER — Other Ambulatory Visit: Payer: Self-pay

## 2013-12-23 MED ORDER — ALPRAZOLAM 0.5 MG PO TABS: ORAL_TABLET | ORAL | Status: DC

## 2013-12-23 MED ORDER — TRAMADOL HCL 50 MG PO TABS: ORAL_TABLET | ORAL | Status: DC

## 2013-12-23 MED ORDER — PAROXETINE HCL 40 MG PO TABS: ORAL_TABLET | ORAL | Status: DC

## 2013-12-23 MED ORDER — AMLODIPINE BESYLATE 5 MG PO TABS: ORAL_TABLET | ORAL | Status: DC

## 2013-12-28 ENCOUNTER — Encounter: Payer: Self-pay | Admitting: Internal Medicine

## 2013-12-28 ENCOUNTER — Ambulatory Visit (INDEPENDENT_AMBULATORY_CARE_PROVIDER_SITE_OTHER): Payer: Medicare HMO | Admitting: Internal Medicine

## 2013-12-28 VITALS — BP 118/76 | HR 100 | Ht 66.0 in | Wt 206.0 lb

## 2013-12-28 DIAGNOSIS — R0602 Shortness of breath: Secondary | ICD-10-CM

## 2013-12-28 NOTE — Progress Notes (Signed)
HPI Patient is a 68 yo who is referred by Dr Moshe Cipro with a 1 month history of chest pressure   Pain in middle of chest  Sharp  Comes on with stress  Maybe after food.  Occurs usually  in mornings or night. Lays down  Relaxed  Eases off.   Energy level is very low  Has been like that for a long time.   Doesn't walk much. Has sleep apnea  Came off of machine  Told needs to go back on machine. Cant afford  Has had SOB for a long time  But worse over past year.   Allergies  Allergen Reactions  . Ace Inhibitors Cough  . Keflex [Cephalexin] Diarrhea and Nausea And Vomiting  . Nitrofurantoin Diarrhea and Nausea And Vomiting  . Penicillins Hives, Rash and Other (See Comments)    Blisters on hands and feet    Current Outpatient Prescriptions  Medication Sig Dispense Refill  . ALPRAZolam (XANAX) 0.5 MG tablet TAKE 1 TABLET BY MOUTH TWICE A DAY  180 tablet  0  . amLODipine (NORVASC) 5 MG tablet TAKE 1 TABLET (5 MG TOTAL) BY MOUTH DAILY.  90 tablet  1  . diphenhydramine-acetaminophen (TYLENOL PM) 25-500 MG TABS Take 2 tablets by mouth at bedtime as needed (sleep/pain).      Marland Kitchen ibuprofen (ADVIL,MOTRIN) 800 MG tablet TAKE 1 TABLET (800 MG TOTAL) BY MOUTH DAILY. FOR PAIN  90 tablet  1  . lovastatin (MEVACOR) 40 MG tablet TAKE 2 TABLETS BY MOUTH AT BEDTIME  180 tablet  1  . metFORMIN (GLUCOPHAGE) 500 MG tablet TAKE 2 TABLETS (1,000 MG TOTAL) BY MOUTH 2 (TWO) TIMES DAILY WITH A MEAL.  360 tablet  1  . pantoprazole (PROTONIX) 40 MG tablet Take 1 tablet (40 mg total) by mouth 2 (two) times daily.  60 tablet  5  . PARoxetine (PAXIL) 40 MG tablet TAKE 1 TABLET BY MOUTH EVERY MORNING  90 tablet  1  . potassium citrate (UROCIT-K) 10 MEQ (1080 MG) SR tablet TAKE 1 TABLET 3 TIMES DAILY WITH MEALS.  100 tablet  5  . traMADol (ULTRAM) 50 MG tablet TAKE 1 TABLET BY MOUTH TWICE A DAY  180 tablet  1  . Vitamin D, Ergocalciferol, (DRISDOL) 50000 UNITS CAPS capsule Take 1 capsule (50,000 Units total) by mouth every 7  (seven) days.  4 capsule  5   No current facility-administered medications for this visit.    Past Medical History  Diagnosis Date  . Anxiety disorder   . Diabetes mellitus type 1   . Hyperlipidemia   . Hypertension   . Sleep apnea   . Disease of vein   . Coronary artery disease   . Complication of anesthesia     pt states she was not given enough medication with  tcs with Dr Tamala Julian                                    last [place].as able to feel and hear everything.  . Depression   . DEMENTIA   . Renal insufficiency   . Kidney stones   . DDD (degenerative disc disease), lumbar   . Sciatica   . Chronic back pain   . Chronic shoulder pain   . Headache   . ANEMIA 09/08/2009    DEC 2014 Hb 13.5      Past Surgical History  Procedure Laterality Date  .  Partial hysterectomy    . Lithotryspy and stent replacement rt ureter  10/2004    12/2009 Dr, Michela Pitcher   . Removal of lump from posterior r thigh    . Colonoscopy  08/21/06    adenomatous polyps/  . Cardiac catheterization  2005  . Flexible sigmoidoscopy  09/11/2011    [id number] Internal hemorrhoids  . Extraction of left kidney stone    . Bilateral stone removal with nephrostomy at [facility]    . Colonoscopy  10/02/2011    [id number] SIGMOID COLON DIVERTICULOSIS/hemorrhoids  . Esophagogastroduodenoscopy (egd) with propofol N/A 03/17/2013    Procedure: ESOPHAGOGASTRODUODENOSCOPY (EGD) WITH PROPOFOL;  Surgeon: Danie Binder, MD;  Location: [facility];  Service: Endoscopy;  Laterality: N/A;  . Savory dilation N/A 03/17/2013    Procedure: SAVORY DILATION;  Surgeon: Danie Binder, MD;  Location: [facility];  Service: Endoscopy;  Laterality: N/A;  #12.8, 14, 15, 16 dilators used  . Esophageal biopsy N/A 03/17/2013    Procedure: GASTRIC BIOPSIES;  Surgeon: Danie Binder, MD;  Location: [facility];  Service: Endoscopy;  Laterality: N/A;  . Polypectomy N/A 03/17/2013    Procedure: GASTRIC POLYPECTOMY;  Surgeon: Danie Binder, MD;  Location: [facility];  Service: Endoscopy;  Laterality: N/A;  . Abdominal hysterectomy      Family History  Problem Relation Age of Onset  . Hypertension Mother   . Diabetes Mother   . Heart failure Mother   . Dementia Mother   . Emphysema Father   . Hypertension Father   . Diabetes Brother   . GER disease Brother   . Cancer      family history   . Diabetes      family history   . Heart defect      famiily history   . Arthritis      family history   . Anesthesia problems Neg Hx   . Hypotension Neg Hx   . Malignant hyperthermia Neg Hx   . Pseudochol deficiency Neg Hx   . Colon cancer Neg Hx     History   Social History  . Marital Status: Divorced    Spouse Name: N/A    Number of Children: 2  . Years of Education: N/A   Occupational History  . retired     Social History Main Topics  . Smoking status: Former Smoker -- 1.00 packs/day for 20 years    Types: Cigarettes    Quit date: 09/21/1994  . Smokeless tobacco: Never Used  . Alcohol Use: No  . Drug Use: No  . Sexual Activity: Not Currently    Birth Control/ Protection: Surgical   Other Topics Concern  . Not on file   Social History Narrative  . No narrative on file    Review of Systems:  All systems reviewed.  They are negative to the above problem except as previously stated.  Vital Signs: BP 118/76  Pulse 100  Ht 5\' 6"  (1.676 m)  Wt 206 lb (93.441 kg)  BMI 33.27 kg/m2  Physical Exam Patient is a morbidly obese 68 yo in NAD   HEENT:  Normocephalic, atraumatic. EOMI, PERRLA.  Neck: JVP is normal.  No bruits.  Lungs: clear to auscultation. No rales no wheezes.  Heart: Regular rate and rhythm. Normal S1, S2. No S3.   No significant murmurs. PMI not displaced.  Abdomen:  Supple, nontender. Normal bowel sounds. No masses. No hepatomegaly.  Extremities:   Good distal pulses throughout. No lower extremity edema.  Musculoskeletal :moving all extremities.  Neuro:   alert and oriented x3.  CN II-XII grossly  intact.  EKG  6/19  ST 108   Assessment and Plan:  1.  CP  Atypical  I am not convinced cardiac  ? Muscular.  2.  SOB  This is more concerning  She says worse this year  Energy level is down I would sched echo  Also set up for stress Lexiscan COntinue activities as tolerated.

## 2013-12-28 NOTE — Patient Instructions (Signed)
Your physician recommends that you schedule a follow-up appointment in: to be determined after tests.We will call you with results   Your physician recommends that you continue on your current medications as directed. Please refer to the Current Medication list given to you today.   Your physician has requested that you have an echocardiogram. Echocardiography is a painless test that uses sound waves to create images of your heart. It provides your doctor with information about the size and shape of your heart and how well your heart's chambers and valves are working. This procedure takes approximately one hour. There are no restrictions for this procedure.   Your physician has requested that you have a lexiscan myoview. For further information please visit [email or web address]. Please follow instruction sheet, as given.       Thank you for choosing [facility] !

## 2014-01-01 ENCOUNTER — Encounter (HOSPITAL_COMMUNITY)
Admission: RE | Admit: 2014-01-01 | Discharge: 2014-01-01 | Disposition: A | Discharge status: Home / Self Care | Payer: Medicare HMO | Source: Ambulatory Visit | Attending: Internal Medicine | Admitting: Internal Medicine

## 2014-01-01 ENCOUNTER — Ambulatory Visit (HOSPITAL_COMMUNITY)
Admission: RE | Admit: 2014-01-01 | Discharge: 2014-01-01 | Disposition: A | Discharge status: Home / Self Care | Payer: Medicare HMO | Source: Ambulatory Visit | Attending: Internal Medicine | Admitting: Internal Medicine

## 2014-01-01 ENCOUNTER — Encounter (HOSPITAL_COMMUNITY): Payer: Self-pay

## 2014-01-01 ENCOUNTER — Telehealth: Payer: Self-pay

## 2014-01-01 DIAGNOSIS — M549 Dorsalgia, unspecified: Secondary | ICD-10-CM

## 2014-01-01 DIAGNOSIS — E785 Hyperlipidemia, unspecified: Secondary | ICD-10-CM | POA: Insufficient documentation

## 2014-01-01 DIAGNOSIS — I10 Essential (primary) hypertension: Secondary | ICD-10-CM | POA: Insufficient documentation

## 2014-01-01 DIAGNOSIS — R110 Nausea: Secondary | ICD-10-CM | POA: Insufficient documentation

## 2014-01-01 DIAGNOSIS — G4730 Sleep apnea, unspecified: Secondary | ICD-10-CM | POA: Diagnosis not present

## 2014-01-01 DIAGNOSIS — R0989 Other specified symptoms and signs involving the circulatory and respiratory systems: Secondary | ICD-10-CM | POA: Diagnosis not present

## 2014-01-01 DIAGNOSIS — R0609 Other forms of dyspnea: Secondary | ICD-10-CM | POA: Insufficient documentation

## 2014-01-01 DIAGNOSIS — Z6833 Body mass index (BMI) 33.0-33.9, adult: Secondary | ICD-10-CM | POA: Diagnosis not present

## 2014-01-01 DIAGNOSIS — R0602 Shortness of breath: Secondary | ICD-10-CM | POA: Insufficient documentation

## 2014-01-01 DIAGNOSIS — I2510 Atherosclerotic heart disease of native coronary artery without angina pectoris: Secondary | ICD-10-CM | POA: Insufficient documentation

## 2014-01-01 DIAGNOSIS — R0789 Other chest pain: Secondary | ICD-10-CM

## 2014-01-01 DIAGNOSIS — E119 Type 2 diabetes mellitus without complications: Secondary | ICD-10-CM | POA: Diagnosis not present

## 2014-01-01 DIAGNOSIS — Z87891 Personal history of nicotine dependence: Secondary | ICD-10-CM | POA: Diagnosis not present

## 2014-01-01 DIAGNOSIS — I059 Rheumatic mitral valve disease, unspecified: Secondary | ICD-10-CM | POA: Insufficient documentation

## 2014-01-01 DIAGNOSIS — R079 Chest pain, unspecified: Secondary | ICD-10-CM | POA: Insufficient documentation

## 2014-01-01 DIAGNOSIS — I517 Cardiomegaly: Secondary | ICD-10-CM

## 2014-01-01 HISTORY — PX: CARDIOVASCULAR STRESS TEST: SHX262

## 2014-01-01 HISTORY — PX: TRANSTHORACIC ECHOCARDIOGRAM: SHX275

## 2014-01-01 MED ORDER — SODIUM CHLORIDE 0.9 % IJ SOLN
INTRAMUSCULAR | Status: AC
  Administered 2014-01-01: 10 mL via INTRAVENOUS
  Filled 2014-01-01: qty 10

## 2014-01-01 MED ORDER — TECHNETIUM TC 99M SESTAMIBI - CARDIOLITE
30.0000 | Freq: Once | INTRAVENOUS | Status: AC | PRN
  Administered 2014-01-01: 12:00:00 30 via INTRAVENOUS

## 2014-01-01 MED ORDER — REGADENOSON 0.4 MG/5ML IV SOLN
INTRAVENOUS | Status: AC
  Administered 2014-01-01: 0.4 mg via INTRAVENOUS
  Filled 2014-01-01: qty 5

## 2014-01-01 MED ORDER — TECHNETIUM TC 99M SESTAMIBI GENERIC - CARDIOLITE
10.0000 | Freq: Once | INTRAVENOUS | Status: AC | PRN
  Administered 2014-01-01: 10 via INTRAVENOUS

## 2014-01-01 MED ORDER — DILTIAZEM HCL ER COATED BEADS 180 MG PO TB24
180.0000 mg | ORAL_TABLET | Freq: Every day | ORAL | Status: DC
Start: 2014-01-01 — End: 2014-02-01

## 2014-01-01 NOTE — Telephone Encounter (Signed)
Message copied by Bernita Raisin on Fri Jan 01, 2014  5:12 PM ------      Message from: Dorris Carnes V      Created: Fri Jan 01, 2014  3:49 PM       LV systolic function is normal  Mild relaxation abnormality      Stress test was normal      Would recomm trying another medicine instead of amlodipine to see if breating improves      WOuld stop amlodipine  Stiwch to dilitzaem 180 mg.       F/U in clinic in 1 month ------

## 2014-01-01 NOTE — Progress Notes (Signed)
Stress Lab Nurses Notes - [facility]  Cheryl Abbott 01/01/2014 Reason for doing test: Chest Pain and Dyspnea Type of test: Lexiscan MyoviewCardiolite Nurse performing test: Carvel Getting, RN Nuclear Medicine Tech: Redmond Baseman Echo Tech: Not Applicable MD performing test: Myles Gip Family MD: Tula Nakayama Test explained and consent signed: Yes.   IV started: 22g jelco, Saline lock flushed, No redness or edema and Saline lock started in radiology Symptoms: nausea and funny feelings Treatment/Intervention: None Reason test stopped: protocol completed After recovery IV was: Discontinued via X-ray tech, No redness or edema and Saline Lock flushed Patient to return to [place]. Med at :12:20 Patient discharged: Home Patient's Condition upon discharge was: stable Comments: Lexiscan Cardiolite performed. Resting HR was 75 and resting BP  was 116/83, Peak HR was 111 and peak BP was 131/78. Symptoms resolved in recovery. Norlene Duel

## 2014-01-01 NOTE — Telephone Encounter (Signed)
Spoke with contact person,daughter, Stanton Kidney, and relayed info to her

## 2014-01-02 DIAGNOSIS — E669 Obesity, unspecified: Secondary | ICD-10-CM | POA: Insufficient documentation

## 2014-01-02 DIAGNOSIS — E663 Overweight: Secondary | ICD-10-CM | POA: Insufficient documentation

## 2014-01-02 IMAGING — CR DG CHEST 1V PORT
1 series · 1 of 1 positions shown · non-contrast
Comparison: Chest x-ray of 02/15/2011

CLINICAL DATA: Intermittent central chest pain, shortness of breath

PORTABLE CHEST - 1 VIEW

[view not recorded]
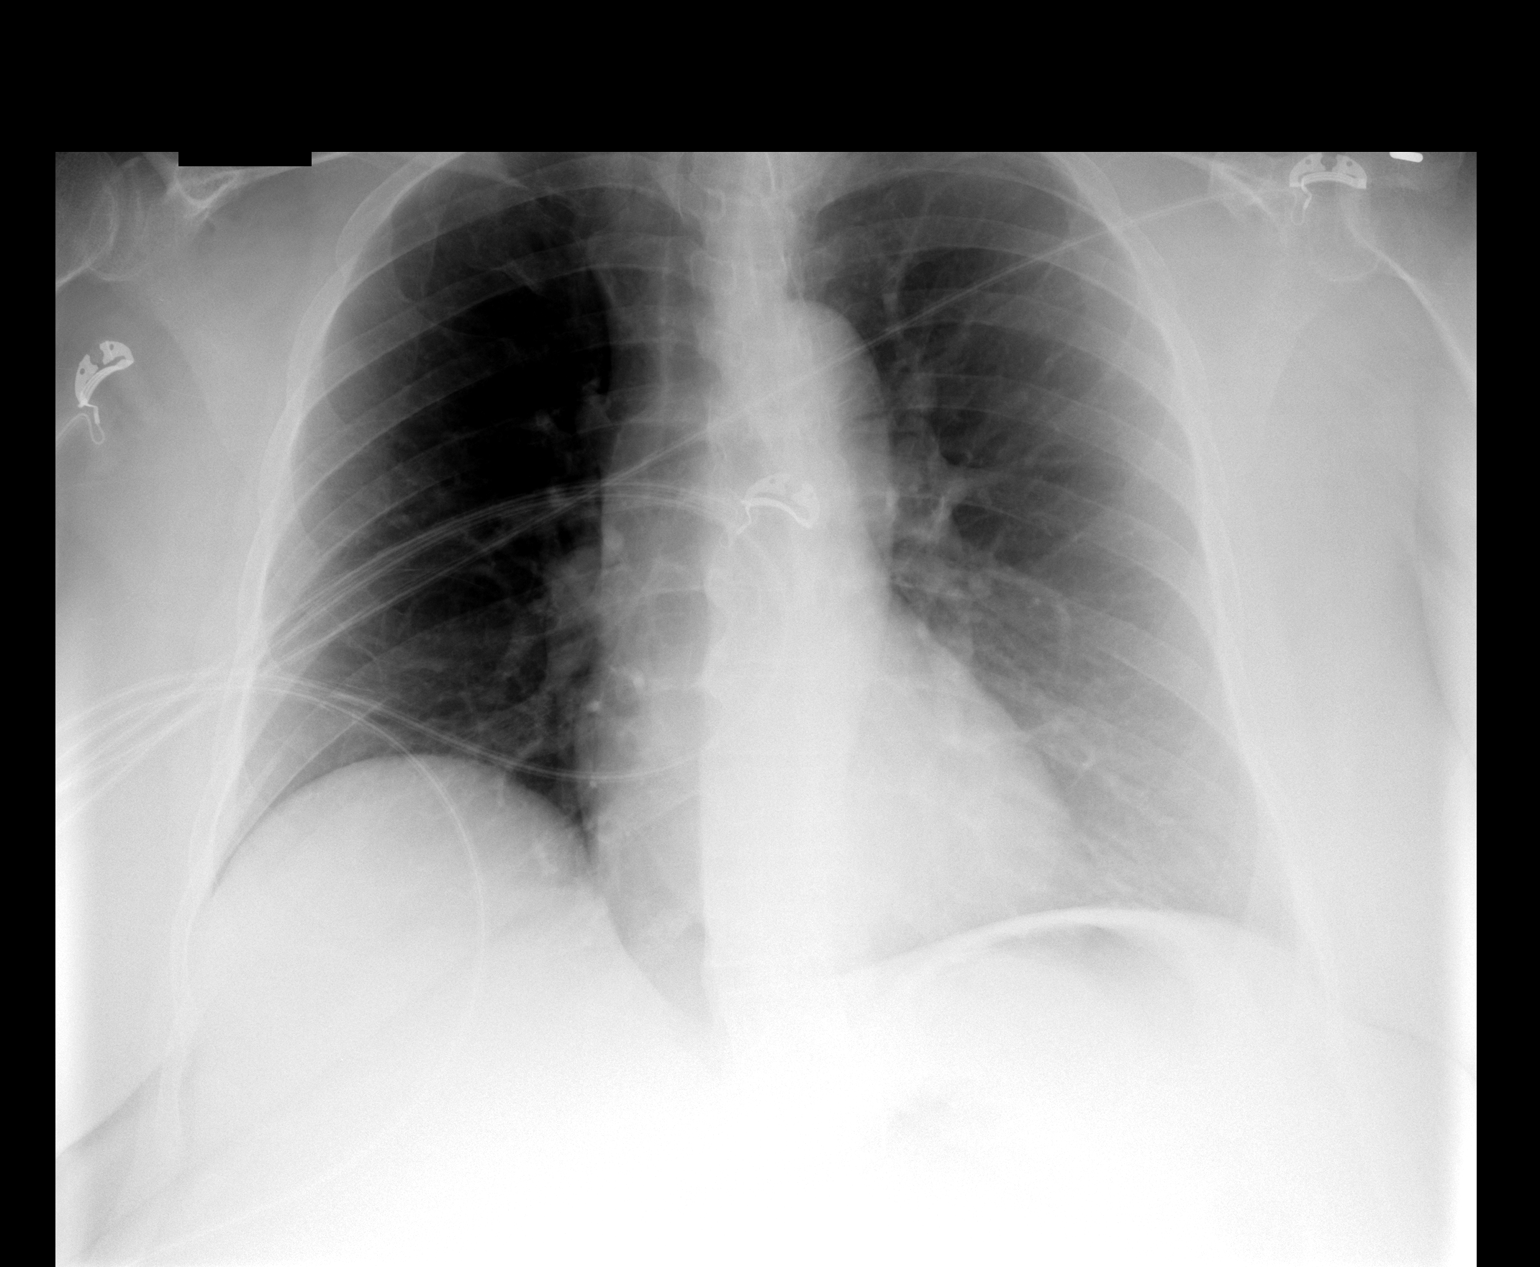

[1 of 1 positions shown; findings below may reference images not displayed]

FINDINGS: The lungs are clear.  Mediastinal contours appear normal.
The heart is within normal limits in size for age.  No bony
abnormality is seen.
IMPRESSION: Stable chest x-ray.  No active lung disease.

## 2014-01-02 NOTE — Assessment & Plan Note (Signed)
Controlled, no change in medication DASH diet and commitment to daily physical activity for a minimum of 30 minutes discussed and encouraged, as a part of hypertension management. The importance of attaining a healthy weight is also discussed.  

## 2014-01-02 NOTE — Assessment & Plan Note (Signed)
Deteriorated. Patient re-educated about  the importance of commitment to a  minimum of 150 minutes of exercise per week. The importance of healthy food choices with portion control discussed. Encouraged to start a food diary, count calories and to consider  joining a support group. Sample diet sheets offered. Goals set by the patient for the next several months.    

## 2014-01-02 NOTE — Assessment & Plan Note (Signed)
Controlled, no change in medication Patient advised to reduce carb and sweets, commit to regular physical activity, take meds as prescribed, test blood as directed, and attempt to lose weight, to improve blood sugar control.  

## 2014-01-02 NOTE — Assessment & Plan Note (Signed)
Controlled, no change in medication  

## 2014-01-14 ENCOUNTER — Other Ambulatory Visit: Payer: Self-pay | Admitting: Family Medicine

## 2014-01-15 ENCOUNTER — Ambulatory Visit: Payer: Medicare HMO | Admitting: Internal Medicine
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
## 2014-02-01 ENCOUNTER — Encounter: Payer: Self-pay | Admitting: Adult Health

## 2014-02-01 ENCOUNTER — Ambulatory Visit (INDEPENDENT_AMBULATORY_CARE_PROVIDER_SITE_OTHER): Payer: Medicare HMO | Admitting: Adult Health

## 2014-02-01 ENCOUNTER — Telehealth: Payer: Self-pay

## 2014-02-01 VITALS — BP 150/98 | HR 92 | Ht 66.0 in | Wt 201.0 lb

## 2014-02-01 DIAGNOSIS — R0789 Other chest pain: Secondary | ICD-10-CM

## 2014-02-01 DIAGNOSIS — R5381 Other malaise: Secondary | ICD-10-CM

## 2014-02-01 DIAGNOSIS — R5383 Other fatigue: Secondary | ICD-10-CM

## 2014-02-01 DIAGNOSIS — I10 Essential (primary) hypertension: Secondary | ICD-10-CM

## 2014-02-01 MED ORDER — AMLODIPINE BESYLATE 10 MG PO TABS: 10.0000 mg | ORAL_TABLET | Freq: Every day | ORAL | Status: DC

## 2014-02-01 NOTE — Patient Instructions (Signed)
Your physician wants you to follow-up in: 6 months You will receive a reminder letter in the mail two months in advance. If you don't receive a letter, please call our office to schedule the follow-up appointment.     Your physician has recommended you make the following change in your medication:     INCREASE Norvasc to 10 mg daily    Please see Dr.Simpson regarding your CPAP equipment.It is important for your heart that you wear it.     Thank you for choosing [facility] !
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
## 2014-02-01 NOTE — Assessment & Plan Note (Signed)
Normal stress trest. No further testing is planned.

## 2014-02-01 NOTE — Assessment & Plan Note (Signed)
Labs followed by Dr. Moshe Cipro.

## 2014-02-01 NOTE — Progress Notes (Signed)
HPI: Cheryl Abbott is a 68 year old patient of Dr. Wyatt Haste. We are following for ongoing assessment and management. Chest pressure. The patient has a history of hypertension, anxiety, diabetes, and sleep apnea. She was last seen by Dr. Harrington Challenger in July of 2015. Her chest and was found to be atypical and more musculoskeletal in etiology. Shortness of breath was more concerning to Dr. Harrington Challenger and, therefore, she is schedule an echocardiogram and Lexiscan Cardiolite.   01/01/2014: Low risk Lexiscan Cardiolite. There were no diagnostic ST segment  abnormalities. Normal myocardial perfusion without evidence of scar  or ischemia. LVEF is calculated at 81% with normal wall motion and  volumes.  Echo: Mild LVH with LVEF [id number], grade 1 diastolic dysfunction. Mildly thickened mitral leaflets with trivial mitral regurgitation. Normal PASP 26 mm mercury. No paracardial effusion.  Still feels tired;. No chest pain. She states that she has OSA but is not using CPAP.  Allergies  Allergen Reactions  . Ace Inhibitors Cough  . Keflex [Cephalexin] Diarrhea and Nausea And Vomiting  . Nitrofurantoin Diarrhea and Nausea And Vomiting  . Penicillins Hives, Rash and Other (See Comments)    Blisters on hands and feet    Current Outpatient Prescriptions  Medication Sig Dispense Refill  . ALPRAZolam (XANAX) 0.5 MG tablet TAKE 1 TABLET BY MOUTH TWICE A DAY  180 tablet  0  . amLODipine (NORVASC) 5 MG tablet Take 5 mg by mouth daily.      Marland Kitchen ibuprofen (ADVIL,MOTRIN) 800 MG tablet Take 800 mg by mouth. 2 tab in the morning and 2 tab  at night      . lovastatin (MEVACOR) 40 MG tablet TAKE 2 TABLETS BY MOUTH AT BEDTIME  180 tablet  1  . metFORMIN (GLUCOPHAGE) 500 MG tablet TAKE 2 TABLETS (1,000 MG TOTAL) BY MOUTH 2 (TWO) TIMES DAILY WITH A MEAL.  360 tablet  1  . pantoprazole (PROTONIX) 40 MG tablet Take 1 tablet (40 mg total) by mouth 2 (two) times daily.  60 tablet  5  . PARoxetine (PAXIL) 40 MG tablet TAKE 1 TABLET  BY MOUTH EVERY MORNING  90 tablet  1  . potassium citrate (UROCIT-K) 10 MEQ (1080 MG) SR tablet TAKE 1 TABLET 3 TIMES DAILY WITH MEALS.  100 tablet  5  . terbinafine (LAMISIL) 250 MG tablet Take 250 mg by mouth daily.      . traMADol (ULTRAM) 50 MG tablet TAKE 1 TABLET BY MOUTH TWICE A DAY  180 tablet  1  . Vitamin D, Ergocalciferol, (DRISDOL) 50000 UNITS CAPS capsule Take 1 capsule (50,000 Units total) by mouth every 7 (seven) days.  4 capsule  5   No current facility-administered medications for this visit.    Past Medical History  Diagnosis Date  . Anxiety disorder   . Diabetes mellitus type 1   . Hyperlipidemia   . Hypertension   . Sleep apnea   . Disease of vein   . Coronary artery disease   . Complication of anesthesia     pt states she was not given enough medication with  tcs with Dr Tamala Julian                                    last [place].as able to feel and hear everything.  . Depression   . DEMENTIA   . Renal insufficiency   . Kidney stones   . DDD (degenerative  disc disease), lumbar   . Sciatica   . Chronic back pain   . Chronic shoulder pain   . Headache   . ANEMIA 09/08/2009    DEC 2014 Hb 13.5      Past Surgical History  Procedure Laterality Date  . Partial hysterectomy    . Lithotryspy and stent replacement rt ureter  10/2004    12/2009 Dr, Michela Pitcher   . Removal of lump from posterior r thigh    . Colonoscopy  08/21/06    adenomatous polyps/  . Cardiac catheterization  2005  . Flexible sigmoidoscopy  09/11/2011    [id number] Internal hemorrhoids  . Extraction of left kidney stone    . Bilateral stone removal with nephrostomy at [facility]    . Colonoscopy  10/02/2011    [id number] SIGMOID COLON DIVERTICULOSIS/hemorrhoids  . Esophagogastroduodenoscopy (egd) with propofol N/A 03/17/2013    Procedure: ESOPHAGOGASTRODUODENOSCOPY (EGD) WITH PROPOFOL;  Surgeon: Danie Binder, MD;  Location: [facility];  Service: Endoscopy;  Laterality: N/A;  . Savory dilation N/A  03/17/2013    Procedure: SAVORY DILATION;  Surgeon: Danie Binder, MD;  Location: [facility];  Service: Endoscopy;  Laterality: N/A;  #12.8, 14, 15, 16 dilators used  . Esophageal biopsy N/A 03/17/2013    Procedure: GASTRIC BIOPSIES;  Surgeon: Danie Binder, MD;  Location: [facility];  Service: Endoscopy;  Laterality: N/A;  . Polypectomy N/A 03/17/2013    Procedure: GASTRIC POLYPECTOMY;  Surgeon: Danie Binder, MD;  Location: [facility];  Service: Endoscopy;  Laterality: N/A;  . Abdominal hysterectomy      ROS: Review of systems complete and found to be negative unless listed above PHYSICAL EXAM BP 150/98  Pulse 92  Ht 5\' 6"  (1.676 m)  Wt 201 lb (91.173 kg)  BMI 32.46 kg/m2  SpO2 95%   General: Well developed, well nourished, in no acute distress Head: Eyes PERRLA, No xanthomas.   Normal cephalic and atramatic  Lungs: Clear bilaterally to auscultation and percussion. Heart: HRRR S1 S2, without MRG.  Pulses are 2+ & equal.            No carotid bruit. No JVD.  No abdominal bruits. No femoral bruits. Abdomen: Bowel sounds are positive, abdomen soft and non-tender without masses or                  Hernia's noted. Msk:  Back normal, normal gait. Normal strength and tone for age. Extremities: No clubbing, cyanosis or edema.  DP +1 Neuro: Alert and oriented X 3. Psych:  Good affect, responds appropriately     ASSESSMENT AND PLAN

## 2014-02-01 NOTE — Assessment & Plan Note (Signed)
She is not well controlled after medication adjustments. I will increase amlodipine to 10 mg daily. She is not wearing CPAP as directed because it is broken.I have advised her to find a place to replace it or repair it as this will substaintially contribute to hypertension.

## 2014-02-01 NOTE — Telephone Encounter (Signed)
Needs to re establish with new lung specialist if UNABLE to work with Dr Freddie Apley office. I hope she is also explaining to them what she is calling telling us.  If she decides to change to a new Doc and start bills all over again, then refer her to [facility] please for eval and mx of sleep apnea

## 2014-02-01 NOTE — Telephone Encounter (Signed)
States her cardiologist said it was very important that she start back using her CPAP machine. Dr Merlene Laughter set it up the first time but she owes a bill and his office won't discuss anything with her until its paid. Wants to know what she needs to do. Please advise

## 2014-02-01 NOTE — Assessment & Plan Note (Signed)
Stress test and echo were found to be normal with the exception of diastolic dysfunction. BP control is essential.

## 2014-02-02 NOTE — Telephone Encounter (Signed)
Patient does not want referral now, is making payments to [place] to stay with his practice.

## 2014-02-04 ENCOUNTER — Ambulatory Visit (INDEPENDENT_AMBULATORY_CARE_PROVIDER_SITE_OTHER): Payer: Medicare HMO | Admitting: Family Medicine

## 2014-02-04 ENCOUNTER — Encounter (INDEPENDENT_AMBULATORY_CARE_PROVIDER_SITE_OTHER): Payer: Self-pay

## 2014-02-04 ENCOUNTER — Encounter: Payer: Self-pay | Admitting: Family Medicine

## 2014-02-04 VITALS — BP 132/82 | HR 100 | Resp 18 | Ht 67.0 in | Wt 210.1 lb

## 2014-02-04 DIAGNOSIS — G4734 Idiopathic sleep related nonobstructive alveolar hypoventilation: Secondary | ICD-10-CM | POA: Insufficient documentation

## 2014-02-04 DIAGNOSIS — N058 Unspecified nephritic syndrome with other morphologic changes: Secondary | ICD-10-CM

## 2014-02-04 DIAGNOSIS — Z23 Encounter for immunization: Secondary | ICD-10-CM

## 2014-02-04 DIAGNOSIS — F3289 Other specified depressive episodes: Secondary | ICD-10-CM

## 2014-02-04 DIAGNOSIS — E669 Obesity, unspecified: Secondary | ICD-10-CM

## 2014-02-04 DIAGNOSIS — G4730 Sleep apnea, unspecified: Secondary | ICD-10-CM

## 2014-02-04 DIAGNOSIS — E1129 Type 2 diabetes mellitus with other diabetic kidney complication: Secondary | ICD-10-CM

## 2014-02-04 DIAGNOSIS — E1121 Type 2 diabetes mellitus with diabetic nephropathy: Secondary | ICD-10-CM

## 2014-02-04 DIAGNOSIS — R0902 Hypoxemia: Secondary | ICD-10-CM | POA: Insufficient documentation

## 2014-02-04 DIAGNOSIS — F32A Depression, unspecified: Secondary | ICD-10-CM

## 2014-02-04 DIAGNOSIS — I10 Essential (primary) hypertension: Secondary | ICD-10-CM

## 2014-02-04 DIAGNOSIS — F329 Major depressive disorder, single episode, unspecified: Secondary | ICD-10-CM

## 2014-02-04 DIAGNOSIS — E785 Hyperlipidemia, unspecified: Secondary | ICD-10-CM

## 2014-02-04 DIAGNOSIS — R0789 Other chest pain: Secondary | ICD-10-CM

## 2014-02-04 HISTORY — DX: Idiopathic sleep related nonobstructive alveolar hypoventilation: G47.34

## 2014-02-04 NOTE — Assessment & Plan Note (Signed)
Controlled, no change in medication DASH diet and commitment to daily physical activity for a minimum of 30 minutes discussed and encouraged, as a part of hypertension management. The importance of attaining a healthy weight is also discussed.  

## 2014-02-04 NOTE — Patient Instructions (Signed)
F/u as before  Fasting lipid, cmp and EGFR, hBA1C  First week Nov  Blood pressure is excellent , stay on same medication  You are being referred to see if you need oxygen while asleep, you STILL need to get back on your CPAP machine as soon as possible  Flu vac today

## 2014-02-07 DIAGNOSIS — Z23 Encounter for immunization: Secondary | ICD-10-CM | POA: Insufficient documentation

## 2014-02-07 NOTE — Assessment & Plan Note (Signed)
Vaccine administered at visit.  

## 2014-02-07 NOTE — Assessment & Plan Note (Signed)
Controlled, no change in medication  

## 2014-02-07 NOTE — Progress Notes (Signed)
Subjective:    Patient ID: Cheryl Abbott, female    DOB: 28-Nov-1945, 68 y.o.   MRN: [id number]  HPI The PT is here for follow up and re-evaluation of chronic medical conditions, medication management and review of any available recent lab and radiology data.  Preventive health is updated, specifically  Cancer screening and Immunization.   Questions or concerns regarding consultations or procedures which the PT has had in the interim are  addressed. The PT denies any adverse reactions to current medications since the last visit.  Stressed over inability to afford her CPAP machine but is working on this. Will refer for overnight pulse oximetry in the interim   Concerned about BP , feels it is "fluctuating too much"     Review of Systems See HPI Denies recent fever or chills.c/o fatigue Denies sinus pressure, nasal congestion, ear pain or sore throat. Denies chest congestion, productive cough or wheezing. Denies chest pains, palpitations and leg swelling Denies abdominal pain, nausea, vomiting,diarrhea or constipation.   Denies dysuria, frequency, hesitancy or incontinence. Denies  disabling joint pain, swelling and limitation in mobility. Denies headaches, seizures, numbness, or tingling. Denies uncontrolled  depression, anxiety or insomnia. Denies skin break down or rash.         Objective:   Physical Exam BP 132/82  Pulse 100  Resp 18  Ht 5\' 7"  (1.702 m)  Wt 210 lb 1.3 oz (95.292 kg)  BMI 32.90 kg/m2  SpO2 94% Patient alert and oriented and in no cardiopulmonary distress.  HEENT: No facial asymmetry, EOMI,   oropharynx pink and moist.  Neck supple no JVD, no mass.  Chest: Clear to auscultation bilaterally.  CVS: S1, S2 no murmurs, no S3.Regular rate.  ABD: Soft non tender.   Ext: No edema  MS: Adequate ROM spine, shoulders, hips and knees.  Skin: Intact, no ulcerations or rash noted.  Psych: Good eye contact, normal affect. Memory intact not anxious or  depressed appearing.  CNS: CN 2-12 intact, power,  normal throughout.no focal deficits noted.        Assessment & Plan:  Hypertension goal BP (blood pressure) < 130/80 Controlled, no change in medication DASH diet and commitment to daily physical activity for a minimum of 30 minutes discussed and encouraged, as a part of hypertension management. The importance of attaining a healthy weight is also discussed.   Nocturnal hypoxia Send for specific testing so pt can get supplemental nocturnal oxygemn if needed, most likely so, as she waits on her CPAP  Type 2 diabetes with nephropathy Improved Patient advised to reduce carb and sweets, commit to regular physical activity, take meds as prescribed, test blood as directed, and attempt to lose weight, to improve blood sugar control. Updated lab needed at/ before next visit.   Hyperlipidemia LDL goal <100 Controlled, no change in medication Hyperlipidemia:Low fat diet discussed and encouraged.  Updated lab needed at/ before next visit.   Obesity (BMI 30.0-34.9) Deteriorated. Patient re-educated about  the importance of commitment to a  minimum of 150 minutes of exercise per week. The importance of healthy food choices with portion control discussed. Encouraged to start a food diary, count calories and to consider  joining a support group. Sample diet sheets offered. Goals set by the patient for the next several months.     SLEEP APNEA Needs to get financials in order so she can get back on  Her CPAP machine which she needs, I have advised her of this multiple times  Depression Controlled, no change in medication   Need for prophylactic vaccination and inoculation against influenza Vaccine administered at visit.

## 2014-02-07 NOTE — Assessment & Plan Note (Signed)
Send for specific testing so pt can get supplemental nocturnal oxygemn if needed, most likely so, as she waits on her CPAP

## 2014-02-07 NOTE — Assessment & Plan Note (Signed)
Improved Patient advised to reduce carb and sweets, commit to regular physical activity, take meds as prescribed, test blood as directed, and attempt to lose weight, to improve blood sugar control. Updated lab needed at/ before next visit.

## 2014-02-07 NOTE — Assessment & Plan Note (Signed)
Controlled, no change in medication Hyperlipidemia:Low fat diet discussed and encouraged.  Updated lab needed at/ before next visit.  

## 2014-02-07 NOTE — Assessment & Plan Note (Signed)
Needs to get financials in order so she can get back on  Her CPAP machine which she needs, I have advised her of this multiple times

## 2014-02-07 NOTE — Assessment & Plan Note (Signed)
Deteriorated. Patient re-educated about  the importance of commitment to a  minimum of 150 minutes of exercise per week. The importance of healthy food choices with portion control discussed. Encouraged to start a food diary, count calories and to consider  joining a support group. Sample diet sheets offered. Goals set by the patient for the next several months.    

## 2014-02-12 ENCOUNTER — Encounter: Payer: Self-pay | Admitting: Family Medicine

## 2014-02-12 ENCOUNTER — Telehealth: Payer: Self-pay | Admitting: Family Medicine

## 2014-02-12 NOTE — Telephone Encounter (Signed)
Pt does qualify for oxygen while asleep, pls set this up , I will sign any necessary paperwork, result of overnight study done 02/10/2014 confirms this

## 2014-02-12 NOTE — Telephone Encounter (Signed)
Patient aware.  Forms ready for signature

## 2014-02-14 ENCOUNTER — Other Ambulatory Visit: Payer: Self-pay | Admitting: Family Medicine

## 2014-02-23 ENCOUNTER — Telehealth: Payer: Self-pay | Admitting: Family Medicine

## 2014-02-23 ENCOUNTER — Ambulatory Visit: Payer: Medicare HMO | Admitting: Family Medicine

## 2014-02-23 MED ORDER — ALPRAZOLAM 0.5 MG PO TABS: ORAL_TABLET | ORAL | Status: DC

## 2014-02-23 NOTE — Telephone Encounter (Signed)
Med refilled. Will fax to pharmacy once signed

## 2014-03-09 ENCOUNTER — Other Ambulatory Visit: Payer: Self-pay | Admitting: Family Medicine

## 2014-03-09 DIAGNOSIS — Z139 Encounter for screening, unspecified: Secondary | ICD-10-CM
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
## 2014-03-18 ENCOUNTER — Telehealth: Payer: Self-pay | Admitting: Family Medicine

## 2014-03-18 NOTE — Telephone Encounter (Signed)
Letter sent to remind her to have her eye exam

## 2014-03-24 ENCOUNTER — Other Ambulatory Visit: Payer: Self-pay

## 2014-03-24 MED ORDER — METFORMIN HCL 500 MG PO TABS: ORAL_TABLET | ORAL | Status: DC

## 2014-03-25 ENCOUNTER — Telehealth: Payer: Self-pay | Admitting: Family Medicine

## 2014-03-25 DIAGNOSIS — E119 Type 2 diabetes mellitus without complications: Secondary | ICD-10-CM

## 2014-03-25 DIAGNOSIS — Z0100 Encounter for examination of eyes and vision without abnormal findings: Principal | ICD-10-CM

## 2014-03-25 NOTE — Telephone Encounter (Signed)
Pls let pt know she needs her eye exam, her ins has notified me, I am entering the referral pls sched after you spk with her and she confirms that she will go

## 2014-03-29 ENCOUNTER — Emergency Department (HOSPITAL_COMMUNITY)
Admission: EM | Admit: 2014-03-29 | Discharge: 2014-03-29 | Disposition: A | Discharge status: Home / Self Care | Payer: Medicare HMO | Attending: Emergency Medicine | Admitting: Emergency Medicine

## 2014-03-29 ENCOUNTER — Telehealth: Payer: Self-pay

## 2014-03-29 ENCOUNTER — Other Ambulatory Visit: Payer: Self-pay

## 2014-03-29 ENCOUNTER — Ambulatory Visit (HOSPITAL_COMMUNITY)
Admission: RE | Admit: 2014-03-29 | Discharge: 2014-03-29 | Disposition: A | Discharge status: Home / Self Care | Payer: Medicare HMO | Source: Ambulatory Visit | Attending: Family Medicine | Admitting: Family Medicine

## 2014-03-29 ENCOUNTER — Encounter (HOSPITAL_COMMUNITY): Payer: Self-pay | Admitting: Emergency Medicine

## 2014-03-29 DIAGNOSIS — E785 Hyperlipidemia, unspecified: Secondary | ICD-10-CM | POA: Diagnosis not present

## 2014-03-29 DIAGNOSIS — M6258 Muscle wasting and atrophy, not elsewhere classified, other site: Secondary | ICD-10-CM

## 2014-03-29 DIAGNOSIS — F419 Anxiety disorder, unspecified: Secondary | ICD-10-CM | POA: Diagnosis not present

## 2014-03-29 DIAGNOSIS — Z8669 Personal history of other diseases of the nervous system and sense organs: Secondary | ICD-10-CM | POA: Diagnosis not present

## 2014-03-29 DIAGNOSIS — Z139 Encounter for screening, unspecified: Secondary | ICD-10-CM

## 2014-03-29 DIAGNOSIS — M8949 Other hypertrophic osteoarthropathy, multiple sites: Secondary | ICD-10-CM

## 2014-03-29 DIAGNOSIS — Z880 Allergy status to penicillin: Secondary | ICD-10-CM | POA: Diagnosis not present

## 2014-03-29 DIAGNOSIS — Z79899 Other long term (current) drug therapy: Secondary | ICD-10-CM | POA: Insufficient documentation

## 2014-03-29 DIAGNOSIS — Z862 Personal history of diseases of the blood and blood-forming organs and certain disorders involving the immune mechanism: Secondary | ICD-10-CM | POA: Insufficient documentation

## 2014-03-29 DIAGNOSIS — Z87891 Personal history of nicotine dependence: Secondary | ICD-10-CM | POA: Diagnosis not present

## 2014-03-29 DIAGNOSIS — G8929 Other chronic pain: Secondary | ICD-10-CM | POA: Diagnosis not present

## 2014-03-29 DIAGNOSIS — Z791 Long term (current) use of non-steroidal anti-inflammatories (NSAID): Secondary | ICD-10-CM | POA: Insufficient documentation

## 2014-03-29 DIAGNOSIS — I10 Essential (primary) hypertension: Secondary | ICD-10-CM | POA: Insufficient documentation

## 2014-03-29 DIAGNOSIS — M62562 Muscle wasting and atrophy, not elsewhere classified, left lower leg: Secondary | ICD-10-CM | POA: Diagnosis not present

## 2014-03-29 DIAGNOSIS — Z1231 Encounter for screening mammogram for malignant neoplasm of breast: Secondary | ICD-10-CM | POA: Diagnosis present

## 2014-03-29 DIAGNOSIS — M150 Primary generalized (osteo)arthritis: Secondary | ICD-10-CM | POA: Diagnosis not present

## 2014-03-29 DIAGNOSIS — F0390 Unspecified dementia without behavioral disturbance: Secondary | ICD-10-CM | POA: Diagnosis not present

## 2014-03-29 DIAGNOSIS — I2510 Atherosclerotic heart disease of native coronary artery without angina pectoris: Secondary | ICD-10-CM | POA: Diagnosis not present

## 2014-03-29 DIAGNOSIS — Z9889 Other specified postprocedural states: Secondary | ICD-10-CM | POA: Insufficient documentation

## 2014-03-29 DIAGNOSIS — E109 Type 1 diabetes mellitus without complications: Secondary | ICD-10-CM | POA: Insufficient documentation

## 2014-03-29 DIAGNOSIS — Z87442 Personal history of urinary calculi: Secondary | ICD-10-CM | POA: Diagnosis not present

## 2014-03-29 DIAGNOSIS — M159 Polyosteoarthritis, unspecified: Secondary | ICD-10-CM

## 2014-03-29 DIAGNOSIS — M79605 Pain in left leg: Secondary | ICD-10-CM | POA: Diagnosis present

## 2014-03-29 DIAGNOSIS — F329 Major depressive disorder, single episode, unspecified: Secondary | ICD-10-CM | POA: Diagnosis not present

## 2014-03-29 MED ORDER — IBUPROFEN 800 MG PO TABS: 800.0000 mg | ORAL_TABLET | Freq: Every day | ORAL | Status: DC | PRN

## 2014-03-29 NOTE — ED Provider Notes (Signed)
CSN: [id number]     Arrival date & time 03/29/14  1415 History  This chart was scribed for non-physician practitioner Lily Kocher, PA-C, working with Trish Mage, MD by Zola Button, ED Scribe. This patient was seen in room APFT23/APFT23 and the patient's care was started at 4:26 PM.    Chief Complaint  Patient presents with  . Leg Pain      The history is provided by the patient. No language interpreter was used.   HPI Comments: Cheryl Abbott is a 68 y.o. female with a hx of DM who presents to the Emergency Department complaining of gradual onset, constant unsteadiness with her left leg that began 1 month ago. Patient notes some mild left leg pain and some mild left leg weakness, stating that she feels like her leg may give out when she is walking. She went to Dr. Nils Pyle in [place] for her leg before she stopped working. She had left leg swelling and states that her left leg from her knee to the bottom of her leg was black at the time. However, she states that she hasn't experienced any problems with her left leg since she stopped working until now. Her former job required her to stand for long periods of time. Patient does not do any regular exercise.  PCP: Dr. Moshe Cipro   Past Medical History  Diagnosis Date  . Anxiety disorder   . Diabetes mellitus type 1   . Hyperlipidemia   . Hypertension   . Sleep apnea   . Disease of vein   . Coronary artery disease   . Complication of anesthesia     pt states she was not given enough medication with  tcs with Dr Tamala Julian                                    last [place].as able to feel and hear everything.  . Depression   . DEMENTIA   . Renal insufficiency   . Kidney stones   . DDD (degenerative disc disease), lumbar   . Sciatica   . Chronic back pain   . Chronic shoulder pain   . Headache   . ANEMIA 09/08/2009    DEC 2014 Hb 13.5     Past Surgical History  Procedure Laterality Date  . Partial hysterectomy    . Lithotryspy and stent  replacement rt ureter  10/2004    12/2009 Dr, Michela Pitcher   . Removal of lump from posterior r thigh    . Colonoscopy  08/21/06    adenomatous polyps/  . Cardiac catheterization  2005  . Flexible sigmoidoscopy  09/11/2011    [id number] Internal hemorrhoids  . Extraction of left kidney stone    . Bilateral stone removal with nephrostomy at [facility]    . Colonoscopy  10/02/2011    [id number] SIGMOID COLON DIVERTICULOSIS/hemorrhoids  . Esophagogastroduodenoscopy (egd) with propofol N/A 03/17/2013    Procedure: ESOPHAGOGASTRODUODENOSCOPY (EGD) WITH PROPOFOL;  Surgeon: Danie Binder, MD;  Location: [facility];  Service: Endoscopy;  Laterality: N/A;  . Savory dilation N/A 03/17/2013    Procedure: SAVORY DILATION;  Surgeon: Danie Binder, MD;  Location: [facility];  Service: Endoscopy;  Laterality: N/A;  #12.8, 14, 15, 16 dilators used  . Esophageal biopsy N/A 03/17/2013    Procedure: GASTRIC BIOPSIES;  Surgeon: Danie Binder, MD;  Location: [facility];  Service: Endoscopy;  Laterality: N/A;  . Polypectomy  N/A 03/17/2013    Procedure: GASTRIC POLYPECTOMY;  Surgeon: Danie Binder, MD;  Location: [facility];  Service: Endoscopy;  Laterality: N/A;  . Abdominal hysterectomy     Family History  Problem Relation Age of Onset  . Hypertension Mother   . Diabetes Mother   . Heart failure Mother   . Dementia Mother   . Emphysema Father   . Hypertension Father   . Diabetes Brother   . GER disease Brother   . Cancer      family history   . Diabetes      family history   . Heart defect      famiily history   . Arthritis      family history   . Anesthesia problems Neg Hx   . Hypotension Neg Hx   . Malignant hyperthermia Neg Hx   . Pseudochol deficiency Neg Hx   . Colon cancer Neg Hx    History  Substance Use Topics  . Smoking status: Former Smoker -- 1.00 packs/day for 20 years    Types: Cigarettes    Quit date: 09/21/1994  . Smokeless tobacco: Never Used  . Alcohol Use: No   OB History   Grav  Para Term Preterm Abortions TAB SAB Ect Mult Living   2 2 2       2      Review of Systems  Constitutional: Positive for fatigue. Negative for fever, activity change and appetite change.  HENT: Positive for congestion.   Respiratory: Negative for shortness of breath.   Cardiovascular: Negative for leg swelling.  Musculoskeletal: Positive for arthralgias and myalgias. Negative for joint swelling.  Psychiatric/Behavioral: The patient is nervous/anxious.   All other systems reviewed and are negative.     Allergies  Ace inhibitors; Keflex; Nitrofurantoin; and Penicillins  Home Medications   Prior to Admission medications   Medication Sig Start Date End Date Taking? Authorizing Provider  ALPRAZolam Duanne Moron) 0.5 MG tablet TAKE 1 TABLET BY MOUTH TWICE A DAY 02/23/14   [name], MD  amLODipine (NORVASC) 10 MG tablet Take 1 tablet (10 mg total) by mouth daily. 02/01/14   Lendon Colonel, NP  ibuprofen (ADVIL,MOTRIN) 800 MG tablet Take 1 tablet (800 mg total) by mouth daily as needed. 03/29/14   [name], MD  lovastatin (MEVACOR) 40 MG tablet TAKE 2 TABLETS BY MOUTH AT BEDTIME 10/09/13   [name], MD  MATZIM LA 180 MG 24 hr tablet Take 180 mg by mouth daily. 01/01/14   Historical Provider, MD  metFORMIN (GLUCOPHAGE) 500 MG tablet TAKE 2 TABLETS (1,000 MG TOTAL) BY MOUTH 2 (TWO) TIMES DAILY WITH A MEAL. 03/24/14   [name], MD  pantoprazole (PROTONIX) 40 MG tablet Take 1 tablet (40 mg total) by mouth 2 (two) times daily. 10/12/13   Orvil Feil, NP  PARoxetine (PAXIL) 40 MG tablet TAKE 1 TABLET BY MOUTH EVERY MORNING 12/23/13   [name], MD  PARoxetine (PAXIL) 40 MG tablet TAKE 1 TABLET (40 MG TOTAL) BY MOUTH DAILY. 02/16/14   [name], MD  potassium citrate (UROCIT-K) 10 MEQ (1080 MG) SR tablet TAKE 1 TABLET 3 TIMES DAILY WITH MEALS. 10/27/13   [name], MD  terbinafine (LAMISIL) 250 MG tablet Take 250 mg by mouth daily.    Historical  Provider, MD  traMADol (ULTRAM) 50 MG tablet TAKE 1 TABLET BY MOUTH TWICE A DAY 12/23/13   [name], MD  Vitamin D, Ergocalciferol, (  DRISDOL) 50000 UNITS CAPS capsule Take 1 capsule (50,000 Units total) by mouth every 7 (seven) days. 12/22/13   [name], MD   BP 130/69  Pulse 100  Temp(Src) 99.7 F (37.6 C) (Oral)  Resp 18  Ht 5\' 6"  (1.676 m)  Wt 210 lb (95.255 kg)  BMI 33.91 kg/m2  SpO2 98% Physical Exam  Nursing note and vitals reviewed. Constitutional: She is oriented to person, place, and time. She appears well-developed and well-nourished. No distress.  HENT:  Head: Normocephalic and atraumatic.  Mouth/Throat: Oropharynx is clear and moist. No oropharyngeal exudate.  Eyes: Pupils are equal, round, and reactive to light.  Neck: Neck supple.  Cardiovascular: Normal rate.   Pulmonary/Chest: Effort normal.  Musculoskeletal: She exhibits no edema.  Mild atrophy of the quadriceps on the left. Degenerative changes in the right and left knee. Mod crepitus present with ROM exercises. No mass of the posterior knee on the left. Multiple varicosities on the right lower extremity. No pitting edema of the lower extremities. No hot joints.  Neurological: She is alert and oriented to person, place, and time. No cranial nerve deficit.  Gait is intact. No foot drop.  Skin: Skin is warm and dry. No rash noted.  Psychiatric: She has a normal mood and affect. Her behavior is normal.    ED Course  Procedures  DIAGNOSTIC STUDIES: Oxygen Saturation is 98% on RA, nml by my interpretation.    COORDINATION OF CARE: 4:47 PM-Discussed treatment plan which includes recommendation of water aerobic exercise and leg strengthening with pt at bedside and pt agreed to plan.   Labs Review Labs Reviewed - No data to display  Imaging Review No results found.   EKG Interpretation None      MDM  No acute neurovascular changes appreciated. The patient seems to have no atrophy of the  quadriceps area, left greater than right. She knowledge is that she's not been exercising since leaving work. She also states she has some stiffness and sometimes pain in her lower extremities that does not allow her to exercise routinely she has been doing in the past.  Discussed findings with the patient in terms which he understands and she is in agreement to consider water aerobics and other exercising regimens to strengthen her areas of weakness. Also discussed with the patient the crepitus and signs of degenerative changes particularly in the knees. Patient advised to follow up with her primary physician for additional evaluation of this finding.    Final diagnoses:  Muscle atrophy of lower extremity  Primary osteoarthritis involving multiple joints    **I have reviewed nursing notes, vital signs, and all appropriate lab and imaging results for this patient.*  **I personally performed the services described in this documentation, which was scribed in my presence. The recorded information has been reviewed and is accurate.Lenox Ahr, PA-C 03/30/14 1205

## 2014-03-29 NOTE — Discharge Instructions (Signed)
Your examination reveals arthritis changes involving your knees. You have some mild muscle atrophy of the lower extremity. Please increase your exercises, particularly those to strengthen your lower extremities. Water aerobics maybe helpful. Please follow up with Dr. Moshe Cipro, or return to the emergency department if any additional changes or problems. Arthritis, Nonspecific Arthritis is inflammation of a joint. This usually means pain, redness, warmth or swelling are present. One or more joints may be involved. There are a number of types of arthritis. Your caregiver may not be able to tell what type of arthritis you have right away. CAUSES  The most common cause of arthritis is the wear and tear on the joint (osteoarthritis). This causes damage to the cartilage, which can break down over time. The knees, hips, back and neck are most often affected by this type of arthritis. Other types of arthritis and common causes of joint pain include:  Sprains and other injuries near the joint. Sometimes minor sprains and injuries cause pain and swelling that develop hours later.  Rheumatoid arthritis. This affects hands, feet and knees. It usually affects both sides of your body at the same time. It is often associated with chronic ailments, fever, weight loss and general weakness.  Crystal arthritis. Gout and pseudo gout can cause occasional acute severe pain, redness and swelling in the foot, ankle, or knee.  Infectious arthritis. Bacteria can get into a joint through a break in overlying skin. This can cause infection of the joint. Bacteria and viruses can also spread through the blood and affect your joints.  Drug, infectious and allergy reactions. Sometimes joints can become mildly painful and slightly swollen with these types of illnesses. SYMPTOMS   Pain is the main symptom.  Your joint or joints can also be red, swollen and warm or hot to the touch.  You may have a fever with certain types of  arthritis, or even feel overall ill.  The joint with arthritis will hurt with movement. Stiffness is present with some types of arthritis. DIAGNOSIS  Your caregiver will suspect arthritis based on your description of your symptoms and on your exam. Testing may be needed to find the type of arthritis:  Blood and sometimes urine tests.  X-ray tests and sometimes CT or MRI scans.  Removal of fluid from the joint (arthrocentesis) is done to check for bacteria, crystals or other causes. Your caregiver (or a specialist) will numb the area over the joint with a local anesthetic, and use a needle to remove joint fluid for examination. This procedure is only minimally uncomfortable.  Even with these tests, your caregiver may not be able to tell what kind of arthritis you have. Consultation with a specialist (rheumatologist) may be helpful. TREATMENT  Your caregiver will discuss with you treatment specific to your type of arthritis. If the specific type cannot be determined, then the following general recommendations may apply. Treatment of severe joint pain includes:  Rest.  Elevation.  Anti-inflammatory medication (for example, ibuprofen) may be prescribed. Avoiding activities that cause increased pain.  Only take over-the-counter or prescription medicines for pain and discomfort as recommended by your caregiver.  Cold packs over an inflamed joint may be used for 10 to 15 minutes every hour. Hot packs sometimes feel better, but do not use overnight. Do not use hot packs if you are diabetic without your caregiver's permission.  A cortisone shot into arthritic joints may help reduce pain and swelling.  Any acute arthritis that gets worse over the next 1  to 2 days needs to be looked at to be sure there is no joint infection. Long-term arthritis treatment involves modifying activities and lifestyle to reduce joint stress jarring. This can include weight loss. Also, exercise is needed to nourish the  joint cartilage and remove waste. This helps keep the muscles around the joint strong. HOME CARE INSTRUCTIONS   Do not take aspirin to relieve pain if gout is suspected. This elevates uric acid levels.  Only take over-the-counter or prescription medicines for pain, discomfort or fever as directed by your caregiver.  Rest the joint as much as possible.  If your joint is swollen, keep it elevated.  Use crutches if the painful joint is in your leg.  Drinking plenty of fluids may help for certain types of arthritis.  Follow your caregiver's dietary instructions.  Try low-impact exercise such as:  Swimming.  Water aerobics.  Biking.  Walking.  Morning stiffness is often relieved by a warm shower.  Put your joints through regular range-of-motion. SEEK MEDICAL CARE IF:   You do not feel better in 24 hours or are getting worse.  You have side effects to medications, or are not getting better with treatment. SEEK IMMEDIATE MEDICAL CARE IF:   You have a fever.  You develop severe joint pain, swelling or redness.  Many joints are involved and become painful and swollen.  There is severe back pain and/or leg weakness.  You have loss of bowel or bladder control. Document Released: 07/05/2004 Document Revised: 08/20/2011 Document Reviewed: 07/21/2008 [facility] Patient Information 2015 [place], [place]. This information is not intended to replace advice given to you by your health care provider. Make sure you discuss any questions you have with your health care provider.

## 2014-03-29 NOTE — Telephone Encounter (Signed)
Patient has an appointment with Dr Gershon Crane in November and she is aware

## 2014-03-29 NOTE — ED Notes (Addendum)
Patient complaining of left leg pain x 1 month. States "I called my doctor but she didn't have any openings until December, so I wanted to come here and check on it." States "the pain isn't that bad, but it feels like my leg is going to give out on me." Denies injury.

## 2014-03-29 NOTE — Telephone Encounter (Signed)
3 month supply sent to pharmacy 

## 2014-03-29 NOTE — ED Notes (Signed)
Alert, NAd, pt in w/c , says she has had problem with weakness of legs for 1 month. Has not been seen about this , Able to get out of w/c and step to stretcher without problem.Cheryl Abbott

## 2014-04-01 NOTE — ED Provider Notes (Signed)
Medical screening examination/treatment/procedure(s) were performed by non-physician practitioner and as supervising physician I was immediately available for consultation/collaboration.     Veryl Speak, MD 04/01/14 [phone]

## 2014-04-02 ENCOUNTER — Telehealth: Payer: Self-pay | Admitting: Family Medicine

## 2014-04-02 DIAGNOSIS — M5442 Lumbago with sciatica, left side: Secondary | ICD-10-CM

## 2014-04-02 DIAGNOSIS — M25552 Pain in left hip: Secondary | ICD-10-CM

## 2014-04-03 ENCOUNTER — Other Ambulatory Visit: Payer: Self-pay | Admitting: Family Medicine
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
## 2014-04-08 NOTE — Addendum Note (Signed)
Addended by: Tula Nakayama E on: 04/08/2014 10:17 AM   Modules accepted: Orders

## 2014-04-08 NOTE — Telephone Encounter (Signed)
Pain in her left hip going down her whole leg x 1 month. Hurts to walk. I will put in but I need to know how many days per week and for how long PT is ordered for?

## 2014-04-08 NOTE — Telephone Encounter (Signed)
I have entered

## 2014-04-08 NOTE — Telephone Encounter (Signed)
pls enter, I will sign, but ensure you get correct complaint, is it back pian radiaiting to left leg, or is it specific joint pain, like left knee or left hip, need to clarify that before referring

## 2014-04-12 ENCOUNTER — Encounter (HOSPITAL_COMMUNITY): Payer: Self-pay | Admitting: Emergency Medicine

## 2014-04-20 ENCOUNTER — Encounter: Payer: Self-pay | Admitting: Gastroenterology

## 2014-04-20 LAB — HM DIABETES EYE EXAM

## 2014-04-28 ENCOUNTER — Ambulatory Visit (HOSPITAL_COMMUNITY): Payer: Medicare HMO | Admitting: Physical Therapy

## 2014-04-28 ENCOUNTER — Telehealth (HOSPITAL_COMMUNITY): Payer: Self-pay | Admitting: Physical Therapy

## 2014-04-28 NOTE — Telephone Encounter (Signed)
She can not afford to pay copay for each visit

## 2014-05-08 IMAGING — MR MR HEAD W/O CM
7 of 9 series · 26 of 48 positions shown · non-contrast
Comparison: 07/11/2006 head CT.

CLINICAL DATA: Headache.  Memory loss.

MRI HEAD WITHOUT CONTRAST
TECHNIQUE: Multiplanar, multiecho pulse sequences of the brain and
surrounding structures were obtained according to standard protocol
without intravenous contrast.

[Series 2: T1 · sagittal · 5.0mm · 0.38mm/px · 1 of 17 slices shown]
[im 1/17]
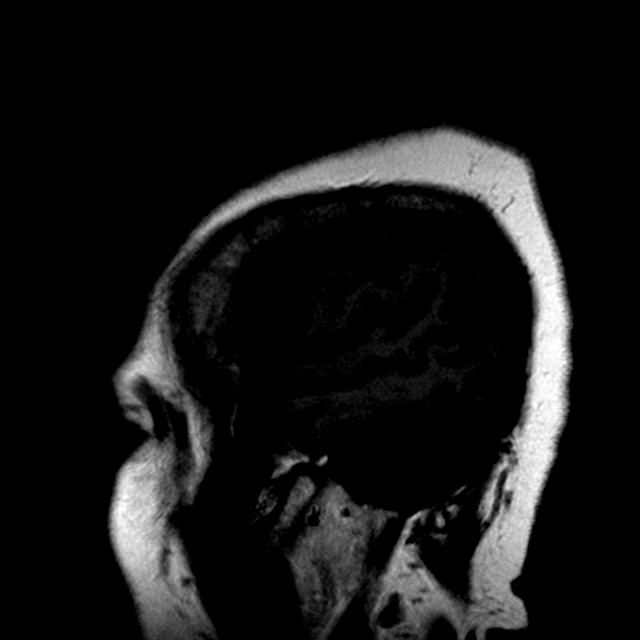

[Series 3: DWI · axial · 5.0mm · 0.72mm/px · z∈[-83,+53]mm · 3 of 22 slices shown (1 of 2)]
[im 1/22]
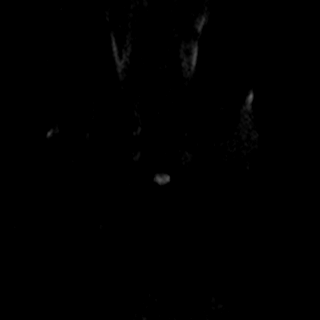
[im 11/22]
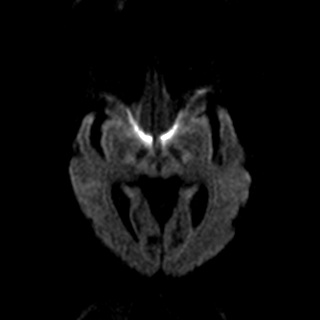
[im 22/22]
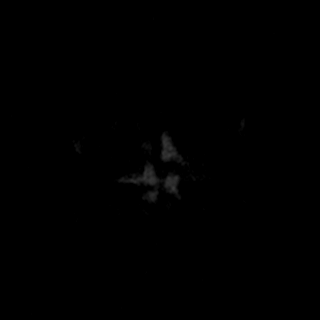

[Series 4: DWI · axial · 5.0mm · 0.72mm/px · z∈[-83,+53]mm · 3 of 22 slices shown (2 of 2)]
[im 1/22]
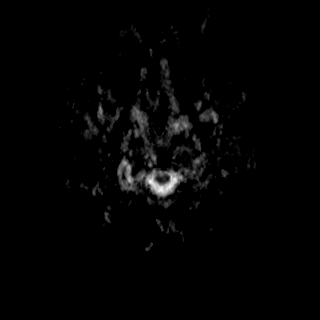
[im 11/22]
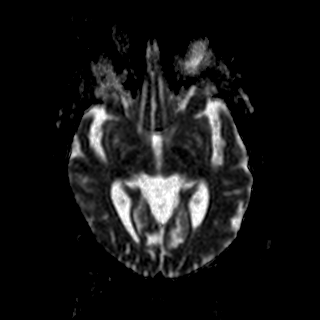
[im 22/22]
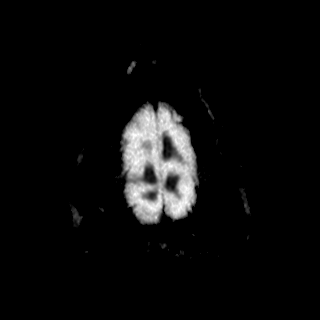

[Series 5: FLAIR · axial · 5.0mm · 0.64mm/px · z∈[-91,+58]mm · 4 of 26 slices shown (1 of 2)]
[im 1/26]
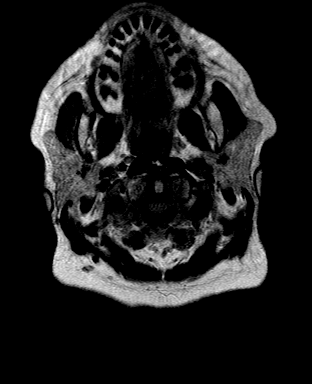
[im 9/26]
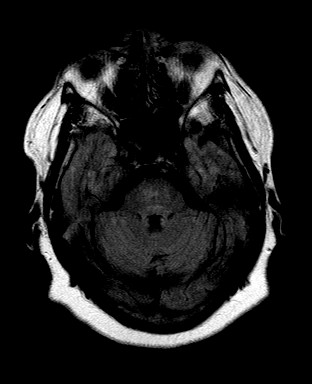
[im 17/26]
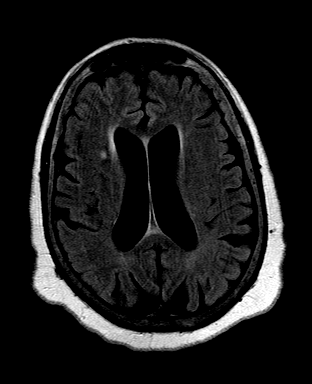
[im 26/26]
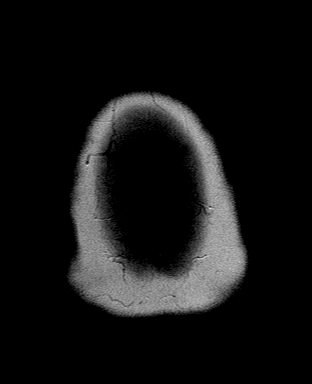

[Series 6: T2 · axial · 5.0mm · 0.57mm/px · z∈[-84,+53]mm · 3 of 24 slices shown (1 of 2)]
[im 1/24]
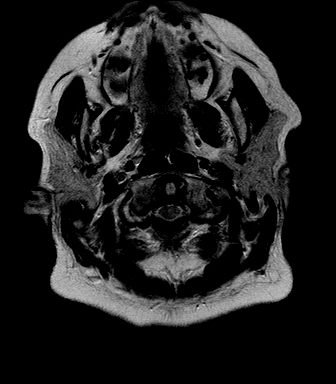
[im 12/24]
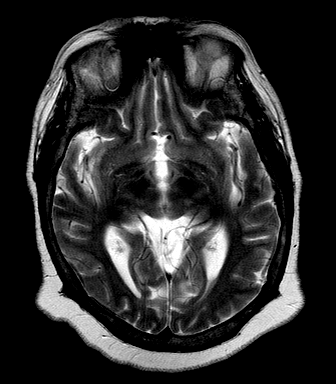
[im 24/24]
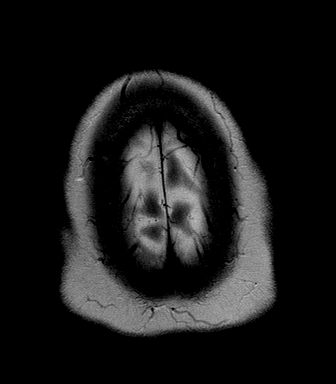

[Series 8: T2 · axial · 5.0mm · 0.51mm/px · z∈[-84,+54]mm · 3 of 24 slices shown (2 of 2)]
[im 1/24]
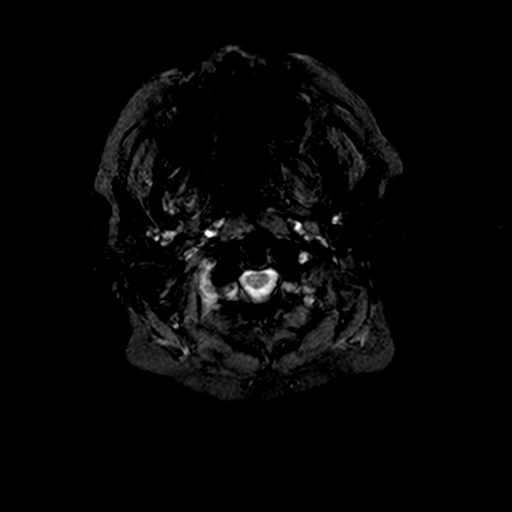
[im 12/24]
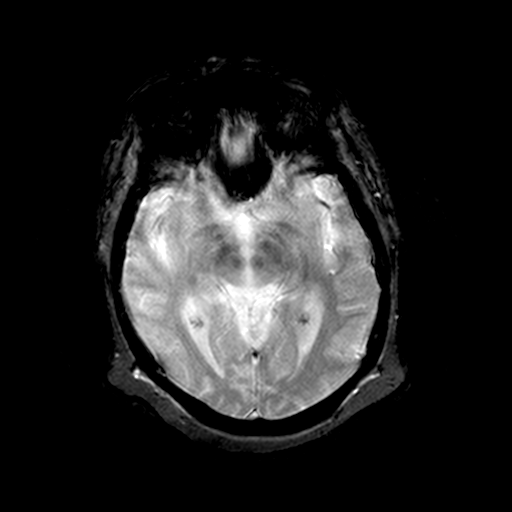
[im 24/24]
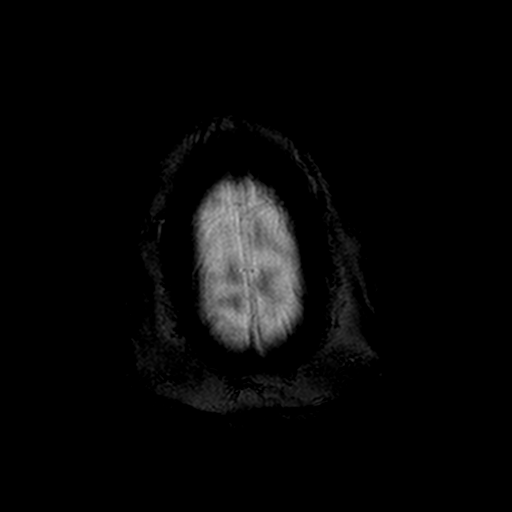

[Series 10: FLAIR · sagittal · 1.8mm · 0.48mm/px · 9 of 88 slices shown (2 of 2)]
[im 1/88]
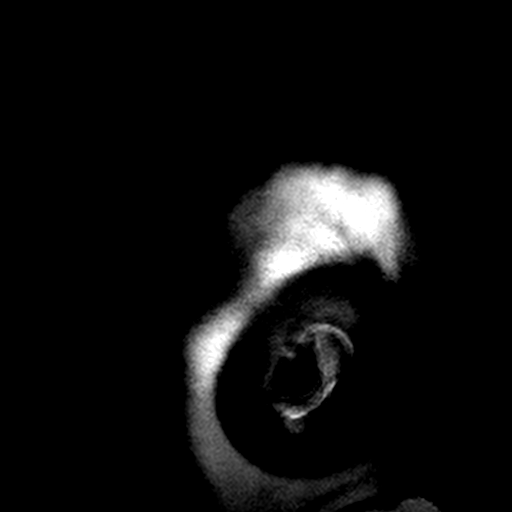
[im 15/88]
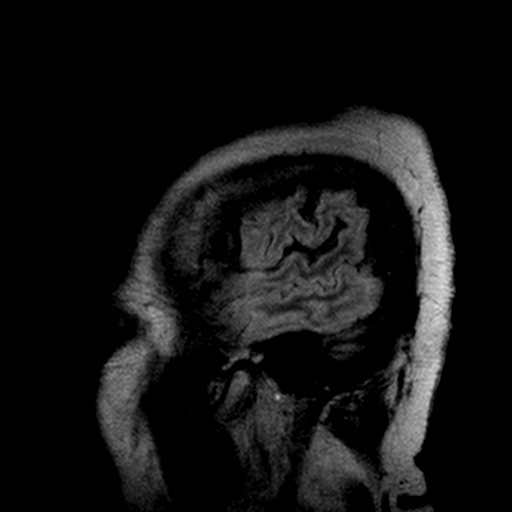
[im 30/88]
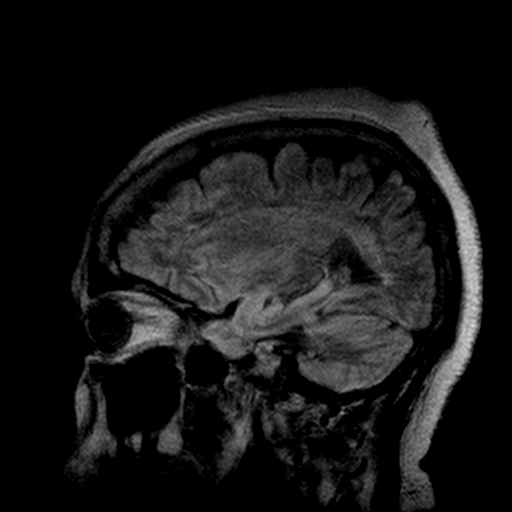
[im 37/88]
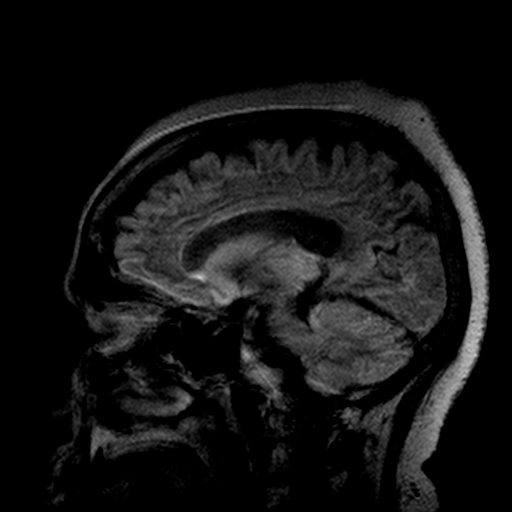
[im 44/88]
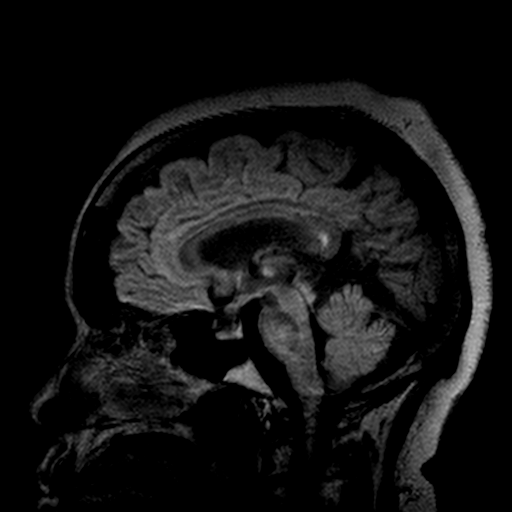
[im 51/88]
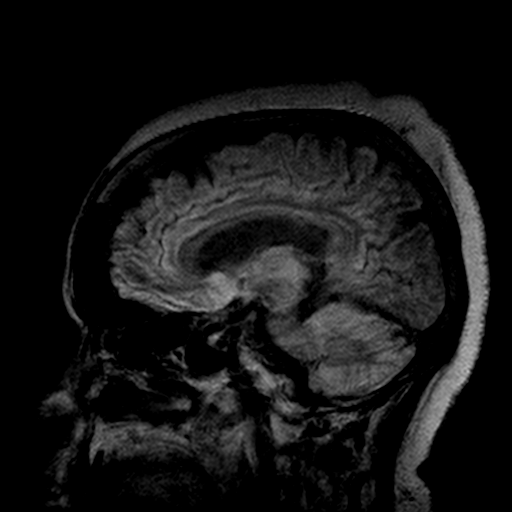
[im 59/88]
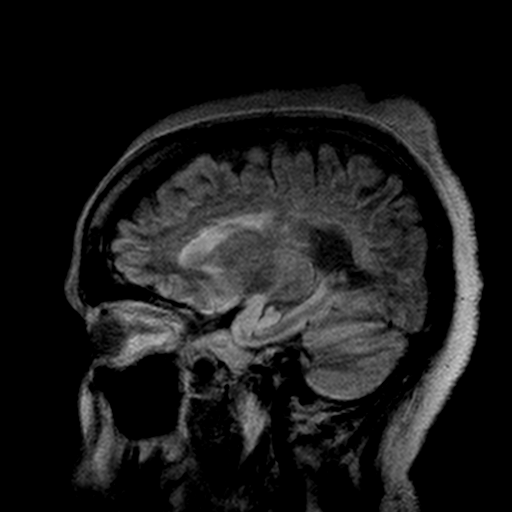
[im 73/88]
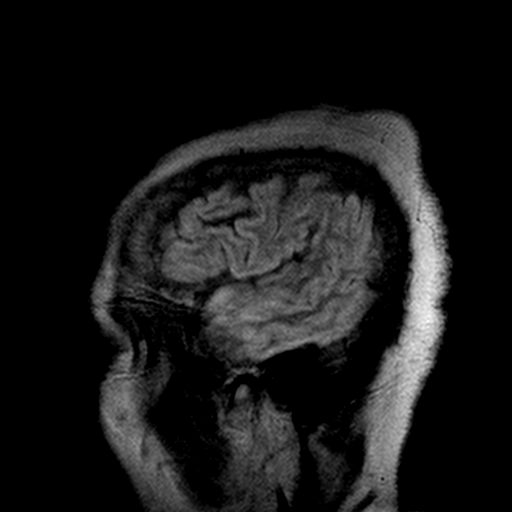
[im 88/88]
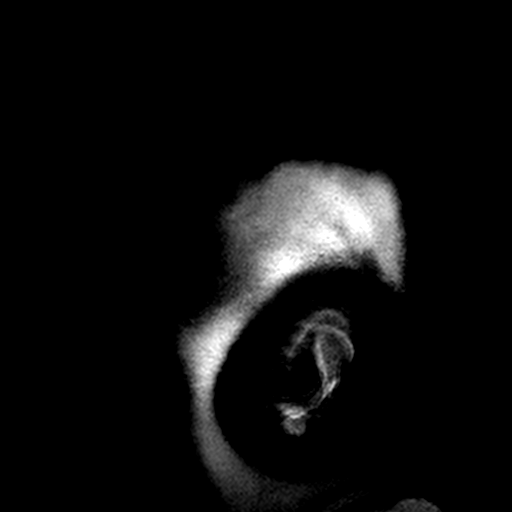

[26 of 48 positions shown; findings below may reference images not displayed]

FINDINGS: No acute infarct.

No intracranial hemorrhage.

Global atrophy advanced for patient's age.  Ventricular prominence
felt to be related to atrophy rather than hydrocephalus.

Mild small vessel disease type changes.

No intracranial mass lesion detected on this unenhanced exam..

Major intracranial vascular structures are patent.

Partially empty sella.  This may be an incidental finding but also
described in patients with pseudotumor cerebri.  Infundibulum is
deviated to the left.  No right-sided pituitary mass identified on
this non dedicated exam.

Exophthalmos.
IMPRESSION: No acute infarct.

Global atrophy advanced for patient's age.

Mild small vessel disease type changes.

Partially empty sella.

## 2014-05-11 ENCOUNTER — Encounter: Payer: Self-pay | Admitting: Family Medicine

## 2014-05-11 ENCOUNTER — Ambulatory Visit (INDEPENDENT_AMBULATORY_CARE_PROVIDER_SITE_OTHER): Payer: Medicare HMO | Admitting: Family Medicine

## 2014-05-11 VITALS — BP 118/74 | HR 100 | Resp 20 | Ht 66.0 in | Wt 204.0 lb

## 2014-05-11 DIAGNOSIS — E1121 Type 2 diabetes mellitus with diabetic nephropathy: Secondary | ICD-10-CM

## 2014-05-11 DIAGNOSIS — M5416 Radiculopathy, lumbar region: Secondary | ICD-10-CM

## 2014-05-11 DIAGNOSIS — E785 Hyperlipidemia, unspecified: Secondary | ICD-10-CM

## 2014-05-11 DIAGNOSIS — R102 Pelvic and perineal pain: Secondary | ICD-10-CM | POA: Insufficient documentation

## 2014-05-11 DIAGNOSIS — Z0000 Encounter for general adult medical examination without abnormal findings: Secondary | ICD-10-CM

## 2014-05-11 DIAGNOSIS — M5417 Radiculopathy, lumbosacral region: Secondary | ICD-10-CM

## 2014-05-11 DIAGNOSIS — Z09 Encounter for follow-up examination after completed treatment for conditions other than malignant neoplasm: Secondary | ICD-10-CM | POA: Insufficient documentation

## 2014-05-11 DIAGNOSIS — N3000 Acute cystitis without hematuria: Secondary | ICD-10-CM

## 2014-05-11 DIAGNOSIS — Z1211 Encounter for screening for malignant neoplasm of colon: Secondary | ICD-10-CM | POA: Insufficient documentation

## 2014-05-11 LAB — LIPID PANEL
Cholesterol: 138 mg/dL (ref 0–200)
HDL: 42 mg/dL (ref >39)
LDL Cholesterol: 67 mg/dL (ref 0–99)
Total CHOL/HDL Ratio: 3.3 Ratio
Triglycerides: 143 mg/dL (ref <150)
VLDL: 29 mg/dL (ref 0–40)

## 2014-05-11 LAB — HEMOGLOBIN A1C
Hgb A1c MFr Bld: 6.9 % — ABNORMAL HIGH (ref <5.7)
Mean Plasma Glucose: 151 mg/dL — ABNORMAL HIGH (ref <117)

## 2014-05-11 LAB — COMPLETE METABOLIC PANEL WITH GFR
ALT: 19 U/L (ref 0–35)
AST: 20 U/L (ref 0–37)
Albumin: 4.3 g/dL (ref 3.5–5.2)
Alkaline Phosphatase: 98 U/L (ref 39–117)
BUN: 11 mg/dL (ref 6–23)
CO2: 28 mEq/L (ref 19–32)
Calcium: 10.2 mg/dL (ref 8.4–10.5)
Chloride: 99 mEq/L (ref 96–112)
Creat: 1 mg/dL (ref 0.50–1.10)
GFR, Est African American: 66 mL/min
GFR, Est Non African American: 58 mL/min — ABNORMAL LOW
Glucose, Bld: 105 mg/dL — ABNORMAL HIGH (ref 70–99)
Potassium: 4 mEq/L (ref 3.5–5.3)
Sodium: 136 mEq/L (ref 135–145)
Total Bilirubin: 0.3 mg/dL (ref 0.2–1.2)
Total Protein: 8.2 g/dL (ref 6.0–8.3)

## 2014-05-11 LAB — HEMOCCULT GUIAC POC 1CARD (OFFICE): Fecal Occult Blood, POC: NEGATIVE

## 2014-05-11 MED ORDER — LORAZEPAM 1 MG PO TABS: ORAL_TABLET | ORAL | Status: DC

## 2014-05-11 NOTE — Assessment & Plan Note (Signed)

## 2014-05-11 NOTE — Assessment & Plan Note (Addendum)
2 week h/o pelvic pain preventing sleep and chronic abdominal pain with recurrent  kidney stones, needs imaging study

## 2014-05-11 NOTE — Assessment & Plan Note (Addendum)
Four month h/o progressive left lower extremity muscle loss, weakness and reduced sensation, needs imaging study

## 2014-05-11 NOTE — Progress Notes (Signed)
   Subjective:    Patient ID: Cheryl Abbott, female    DOB: 14-Jul-1945, 68 y.o.   MRN: [id number]  HPI Patient is in for annual physical exam.Refuses pelvic exam C/o increased low back pain and weakness and wasting of left lower extremity C/o left pelvic and abdominal pain and swelling Recent labs are reviewed and are excellent  Review of Systems    See HPI  Objective:   Physical Exam BP 118/74 mmHg  Pulse 100  Resp 20  Ht 5\' 6"  (1.676 m)  Wt 204 lb (92.534 kg)  BMI 32.94 kg/m2  SpO2 90%  Pleasant  obese female, alert and oriented x 3, in no cardio-pulmonary distress. Afebrile. HEENT No facial trauma or asymetry. Sinuses non tender.  Extra occullar muscles intact, pupils equally reactive to light. External ears normal, tympanic membranes clear. Oropharynx moist, no exudate,fairly  good dentition. Neck: supple, no adenopathy,JVD or thyromegaly.No bruits.  Chest: Clear to ascultation bilaterally.No crackles or wheezes. Non tender to palpation  Breast: No asymetry,no masses or lumps. No tenderness. No nipple discharge or inversion. No axillary or supraclavicular adenopathy  Cardiovascular system; Heart sounds normal,  S1 and  S2 ,no S3.  No murmur, or thrill. Apical beat not displaced Peripheral pulses normal.  Abdomen: Soft, diffuse superficial tenderness, no organomegaly or masses.No renal angle tenderness No bruits. Bowel sounds normal. No guarding, tenderness or rebound.  Rectal:  Normal sphincter tone. No mass.No rectal masses.  Guaiac negative stool.  GU: Not done, pt refused   Musculoskeletal exam: Decreased  ROM of lumbar  spine,  Adequate in hips , shoulders and knees. No deformity ,swelling or crepitus noted. Left thigh wasting and reduced sensation  Neurologic: Cranial nerves 2 to 12 intact. Power, tone ,sensation and reflexes normal throughout. Gait disturbance. No tremor.  Skin: Intact, no ulceration, erythema , scaling or rash  noted. Pigmentation normal throughout  Psych; Normal mood and affect. Judgement and concentration normal        Assessment & Plan:  Annual physical exam Annual exam as documented. Counseling done  re healthy lifestyle involving commitment to 150 minutes exercise per week, heart healthy diet, and attaining healthy weight.The importance of adequate sleep also discussed. Regular seat belt use and home safety, is also discussed. Changes in health habits are decided on by the patient with goals and time frames  set for achieving them. Immunization and cancer screening needs are specifically addressed at this visit.   Lumbar back pain with radiculopathy affecting left lower extremity Four month h/o progressive left lower extremity muscle loss, weakness and reduced sensation, needs imaging study  Pelvic pain in female 2 week h/o pelvic pain preventing sleep and chronic abdominal pain with recurrent  kidney stones, needs imaging study  Hyperlipidemia LDL goal <100 Controlled, no change in medication Hyperlipidemia:Low fat diet discussed and encouraged.    Type 2 diabetes with nephropathy Deteriorated but still controlled, no med change Patient advised to reduce carb and sweets, commit to regular physical activity, take meds as prescribed, test blood as directed, and attempt to lose weight, to improve blood sugar control.

## 2014-05-11 NOTE — Patient Instructions (Addendum)
F/u in 3. month, call if you need me before  Recent labs are excellent, no changes in your medcation, kidney, liver, blood sugar and cholesterol are all excellent   You are referred for an MRI of your low back also for a  CT scan of abdomen and pelvis due to abdominal and pelvic pain  I hope that your family's health improves  Pls cut back on sweets and starchy foods a little bit, as blood sugar level has increased slightly  Non fast hBa1C , chem 7 and EGFr in 3 month  Check with social services to see if you can get on to medicaid that is the process  Urine will be checked for infection today if you can provide a specimen, if not may take urine specimen to lab for testing

## 2014-05-14 ENCOUNTER — Other Ambulatory Visit: Payer: Self-pay | Admitting: Family Medicine

## 2014-05-14 LAB — URINALYSIS W MICROSCOPIC + REFLEX CULTURE
Bilirubin Urine: NEGATIVE
Casts: NONE SEEN
Crystals: NONE SEEN
Glucose, UA: NEGATIVE mg/dL
Ketones, ur: NEGATIVE mg/dL
Nitrite: NEGATIVE
Protein, ur: 30 mg/dL — AB
Specific Gravity, Urine: 1.017 (ref 1.005–1.030)
Urobilinogen, UA: 0.2 mg/dL (ref 0.0–1.0)
pH: 5.5 (ref 5.0–8.0)

## 2014-05-14 MED ORDER — CIPROFLOXACIN HCL 500 MG PO TABS: 500.0000 mg | ORAL_TABLET | Freq: Two times a day (BID) | ORAL | Status: DC

## 2014-05-16 LAB — URINE CULTURE: Colony Count: >=100000

## 2014-05-17 ENCOUNTER — Ambulatory Visit (HOSPITAL_COMMUNITY)
Admission: RE | Admit: 2014-05-17 | Discharge: 2014-05-17 | Disposition: A | Discharge status: Home / Self Care | Payer: Medicare HMO | Source: Ambulatory Visit | Attending: Family Medicine | Admitting: Family Medicine

## 2014-05-17 ENCOUNTER — Telehealth: Payer: Self-pay | Admitting: Family Medicine

## 2014-05-17 DIAGNOSIS — M4806 Spinal stenosis, lumbar region: Secondary | ICD-10-CM | POA: Insufficient documentation

## 2014-05-17 DIAGNOSIS — M7138 Other bursal cyst, other site: Secondary | ICD-10-CM | POA: Diagnosis not present

## 2014-05-17 DIAGNOSIS — M545 Low back pain: Secondary | ICD-10-CM | POA: Insufficient documentation

## 2014-05-17 DIAGNOSIS — M2578 Osteophyte, vertebrae: Secondary | ICD-10-CM | POA: Insufficient documentation

## 2014-05-17 DIAGNOSIS — R531 Weakness: Secondary | ICD-10-CM | POA: Insufficient documentation

## 2014-05-17 DIAGNOSIS — M5126 Other intervertebral disc displacement, lumbar region: Secondary | ICD-10-CM | POA: Insufficient documentation

## 2014-05-17 DIAGNOSIS — M5416 Radiculopathy, lumbar region: Secondary | ICD-10-CM

## 2014-05-17 MED ORDER — ALPRAZOLAM 0.5 MG PO TABS: ORAL_TABLET | ORAL | Status: DC

## 2014-05-17 NOTE — Telephone Encounter (Signed)
Printed for collection

## 2014-05-18 ENCOUNTER — Other Ambulatory Visit: Payer: Self-pay | Admitting: Family Medicine

## 2014-05-18 ENCOUNTER — Other Ambulatory Visit: Payer: Self-pay

## 2014-05-18 ENCOUNTER — Ambulatory Visit (HOSPITAL_COMMUNITY)
Admission: RE | Admit: 2014-05-18 | Discharge: 2014-05-18 | Disposition: A | Discharge status: Home / Self Care | Payer: Medicare HMO | Source: Ambulatory Visit | Attending: Family Medicine | Admitting: Family Medicine

## 2014-05-18 DIAGNOSIS — E119 Type 2 diabetes mellitus without complications: Secondary | ICD-10-CM | POA: Insufficient documentation

## 2014-05-18 DIAGNOSIS — I10 Essential (primary) hypertension: Secondary | ICD-10-CM | POA: Diagnosis not present

## 2014-05-18 DIAGNOSIS — K5730 Diverticulosis of large intestine without perforation or abscess without bleeding: Secondary | ICD-10-CM | POA: Diagnosis not present

## 2014-05-18 DIAGNOSIS — Z90710 Acquired absence of both cervix and uterus: Secondary | ICD-10-CM | POA: Diagnosis not present

## 2014-05-18 DIAGNOSIS — K760 Fatty (change of) liver, not elsewhere classified: Secondary | ICD-10-CM | POA: Diagnosis not present

## 2014-05-18 DIAGNOSIS — N281 Cyst of kidney, acquired: Secondary | ICD-10-CM | POA: Diagnosis not present

## 2014-05-18 DIAGNOSIS — Z87442 Personal history of urinary calculi: Secondary | ICD-10-CM | POA: Insufficient documentation

## 2014-05-18 DIAGNOSIS — R102 Pelvic and perineal pain: Secondary | ICD-10-CM | POA: Insufficient documentation

## 2014-05-18 DIAGNOSIS — N132 Hydronephrosis with renal and ureteral calculous obstruction: Secondary | ICD-10-CM | POA: Insufficient documentation

## 2014-05-18 MED ORDER — IOHEXOL 300 MG/ML  SOLN
100.0000 mL | Freq: Once | INTRAMUSCULAR | Status: AC | PRN
  Administered 2014-05-18: 100 mL via INTRAVENOUS

## 2014-05-18 MED ORDER — SODIUM CHLORIDE 0.9 % IJ SOLN
INTRAMUSCULAR | Status: AC
  Filled 2014-05-18: qty 600

## 2014-05-18 MED ORDER — SODIUM CHLORIDE 0.9 % IJ SOLN
INTRAMUSCULAR | Status: AC
  Filled 2014-05-18: qty 30

## 2014-05-18 MED ORDER — ALPRAZOLAM 0.5 MG PO TABS: ORAL_TABLET | ORAL | Status: DC

## 2014-05-19 ENCOUNTER — Telehealth: Payer: Self-pay

## 2014-05-19 DIAGNOSIS — M5489 Other dorsalgia: Secondary | ICD-10-CM

## 2014-05-19 DIAGNOSIS — R9389 Abnormal findings on diagnostic imaging of other specified body structures: Secondary | ICD-10-CM

## 2014-05-19 NOTE — Telephone Encounter (Signed)
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

## 2014-05-20 ENCOUNTER — Telehealth: Payer: Self-pay | Admitting: Family Medicine

## 2014-05-20 NOTE — Telephone Encounter (Signed)
Also pharmacy stated to her she can get them filled on the 12 and she stated that she must be having withdrawals she is feeling sick

## 2014-05-24 ENCOUNTER — Telehealth: Payer: Self-pay | Admitting: Family Medicine

## 2014-05-24 NOTE — Telephone Encounter (Signed)
Called and left message for patient to return call.  

## 2014-05-25 NOTE — Telephone Encounter (Signed)
Noted  

## 2014-06-08 ENCOUNTER — Ambulatory Visit (INDEPENDENT_AMBULATORY_CARE_PROVIDER_SITE_OTHER): Payer: Medicare HMO | Admitting: Neurology

## 2014-06-08 ENCOUNTER — Encounter: Payer: Self-pay | Admitting: Neurology

## 2014-06-08 VITALS — BP 116/72 | HR 94 | Ht 66.0 in | Wt 198.2 lb

## 2014-06-08 DIAGNOSIS — M5442 Lumbago with sciatica, left side: Secondary | ICD-10-CM

## 2014-06-08 NOTE — Assessment & Plan Note (Signed)
Controlled, no change in medication Hyperlipidemia:Low fat diet discussed and encouraged.  \ 

## 2014-06-08 NOTE — Patient Instructions (Signed)
Back Pain, Adult °Back pain is very common. The pain often gets better over time. The cause of back pain is usually not dangerous. Most people can learn to manage their back pain on their own.  °HOME CARE  °· Stay active. Start with short walks on flat ground if you can. Try to walk farther each day. °· Do not sit, drive, or stand in one place for more than 30 minutes. Do not stay in bed. °· Do not avoid exercise or work. Activity can help your back heal faster. °· Be careful when you bend or lift an object. Bend at your knees, keep the object close to you, and do not twist. °· Sleep on a firm mattress. Lie on your side, and bend your knees. If you lie on your back, put a pillow under your knees. °· Only take medicines as told by your doctor. °· Put ice on the injured area. °¨ Put ice in a plastic bag. °¨ Place a towel between your skin and the bag. °¨ Leave the ice on for 15-20 minutes, 03-04 times a day for the first 2 to 3 days. After that, you can switch between ice and heat packs. °· Ask your doctor about back exercises or massage. °· Avoid feeling anxious or stressed. Find good ways to deal with stress, such as exercise. °GET HELP RIGHT AWAY IF:  °· Your pain does not go away with rest or medicine. °· Your pain does not go away in 1 week. °· You have new problems. °· You do not feel well. °· The pain spreads into your legs. °· You cannot control when you poop (bowel movement) or pee (urinate). °· Your arms or legs feel weak or lose feeling (numbness). °· You feel sick to your stomach (nauseous) or throw up (vomit). °· You have belly (abdominal) pain. °· You feel like you may pass out (faint). °MAKE SURE YOU:  °· Understand these instructions. °· Will watch your condition. °· Will get help right away if you are not doing well or get worse. °Document Released: 11/14/2007 Document Revised: 08/20/2011 Document Reviewed: 09/29/2013 °[employer]® Patient Information ©2015 [employer]. This information is not intended  to replace advice given to you by your health care provider. Make sure you discuss any questions you have with your health care provider. ° °

## 2014-06-08 NOTE — Assessment & Plan Note (Signed)
Deteriorated but still controlled, no med change Patient advised to reduce carb and sweets, commit to regular physical activity, take meds as prescribed, test blood as directed, and attempt to lose weight, to improve blood sugar control.

## 2014-06-08 NOTE — Progress Notes (Signed)
Reason for visit: Back pain, left leg pain  Cheryl Abbott is a 68 y.o. female  History of present illness:  Cheryl Abbott is a 68 year old right-handed black female with a long-standing history of low back pain. The patient indicates that within the last 2-3 months prior to this evaluation, she has had some atrophy of the distal left thigh that was not there previously. She does report a slight leg length discrepancy that is lifelong, with the left leg being slightly shorter than the right. The patient indicates that she has some numbness going down the posterior aspects of the left leg to the foot. She feels as if the left leg is weak. She denies any falls. She indicates that there has been no change in the way the bowels or the bladder are functioning. MRI evaluation of the low back was done on 05/17/2014, and this shows some facet joint changes at the L4-5 level with a minimal disc bulge at the L4-5 level and a disc bulge at the L5-S1 level. No significant nerve root compression was noted. The patient is sent to this office for further evaluation. The patient does have a history of diabetes. The patient denies any previous history of polio.  Past Medical History  Diagnosis Date  . Anxiety disorder   . Diabetes mellitus type 1   . Hyperlipidemia   . Hypertension   . Sleep apnea   . Disease of vein   . Coronary artery disease   . Complication of anesthesia     pt states she was not given enough medication with  tcs with Dr Tamala Julian                                    last [place].as able to feel and hear everything.  . Depression   . DEMENTIA   . Renal insufficiency   . Kidney stones   . DDD (degenerative disc disease), lumbar   . Sciatica   . Chronic back pain   . Chronic shoulder pain   . Headache   . ANEMIA 09/08/2009    DEC 2014 Hb 13.5      Past Surgical History  Procedure Laterality Date  . Partial hysterectomy    . Lithotryspy and stent replacement rt ureter  10/2004   12/2009 Dr, Michela Pitcher   . Removal of lump from posterior r thigh    . Colonoscopy  08/21/06    adenomatous polyps/  . Cardiac catheterization  2005  . Flexible sigmoidoscopy  09/11/2011    [id number] Internal hemorrhoids  . Extraction of left kidney stone    . Bilateral stone removal with nephrostomy at [facility]    . Colonoscopy  10/02/2011    [id number] SIGMOID COLON DIVERTICULOSIS/hemorrhoids  . Esophagogastroduodenoscopy (egd) with propofol N/A 03/17/2013    Procedure: ESOPHAGOGASTRODUODENOSCOPY (EGD) WITH PROPOFOL;  Surgeon: Danie Binder, MD;  Location: [facility];  Service: Endoscopy;  Laterality: N/A;  . Savory dilation N/A 03/17/2013    Procedure: SAVORY DILATION;  Surgeon: Danie Binder, MD;  Location: [facility];  Service: Endoscopy;  Laterality: N/A;  #12.8, 14, 15, 16 dilators used  . Esophageal biopsy N/A 03/17/2013    Procedure: GASTRIC BIOPSIES;  Surgeon: Danie Binder, MD;  Location: [facility];  Service: Endoscopy;  Laterality: N/A;  . Polypectomy N/A 03/17/2013    Procedure: GASTRIC POLYPECTOMY;  Surgeon: Danie Binder, MD;  Location: [facility];  Service: Endoscopy;  Laterality: N/A;  . Abdominal hysterectomy      Family History  Problem Relation Age of Onset  . Hypertension Mother   . Diabetes Mother   . Heart failure Mother   . Dementia Mother   . Emphysema Father   . Hypertension Father   . Diabetes Brother   . GER disease Brother   . Cancer      family history   . Diabetes      family history   . Heart defect      famiily history   . Arthritis      family history   . Anesthesia problems Neg Hx   . Hypotension Neg Hx   . Malignant hyperthermia Neg Hx   . Pseudochol deficiency Neg Hx   . Colon cancer Neg Hx     Social history:  reports that she quit smoking about 19 years ago. Her smoking use included Cigarettes. She has a 20 pack-year smoking history. She has never used smokeless tobacco. She reports that she does not drink alcohol or use illicit  drugs.  Medications:  Current Outpatient Prescriptions on File Prior to Visit  Medication Sig Dispense Refill  . ALPRAZolam (XANAX) 0.5 MG tablet TAKE 1 TABLET BY MOUTH TWICE A DAY 180 tablet 0  . amLODipine (NORVASC) 5 MG tablet     . ibuprofen (ADVIL,MOTRIN) 800 MG tablet Take 1 tablet (800 mg total) by mouth daily as needed. 30 tablet 0  . LORazepam (ATIVAN) 1 MG tablet One tablet 30 minutes before test, repeat after 15 minutes if needed, one time only 2 tablet 0  . lovastatin (MEVACOR) 40 MG tablet TAKE 2 TABLETS BY MOUTH AT BEDTIME 180 tablet 1  . metFORMIN (GLUCOPHAGE) 500 MG tablet TAKE 2 TABLETS (1,000 MG TOTAL) BY MOUTH 2 (TWO) TIMES DAILY WITH A MEAL. 360 tablet 1  . pantoprazole (PROTONIX) 40 MG tablet Take 1 tablet (40 mg total) by mouth 2 (two) times daily. 60 tablet 5  . PARoxetine (PAXIL) 40 MG tablet TAKE 1 TABLET BY MOUTH EVERY MORNING 90 tablet 1  . potassium citrate (UROCIT-K) 10 MEQ (1080 MG) SR tablet TAKE 1 TABLET 3 TIMES DAILY WITH MEALS. 100 tablet 5  . terbinafine (LAMISIL) 250 MG tablet Take 250 mg by mouth daily.    . traMADol (ULTRAM) 50 MG tablet TAKE 1 TABLET BY MOUTH TWICE A DAY 180 tablet 1  . Vitamin D, Ergocalciferol, (DRISDOL) 50000 UNITS CAPS capsule Take 1 capsule (50,000 Units total) by mouth every 7 (seven) days. 4 capsule 5  . MATZIM LA 180 MG 24 hr tablet Take 180 mg by mouth daily.     No current facility-administered medications on file prior to visit.      Allergies  Allergen Reactions  . Ace Inhibitors Cough  . Keflex [Cephalexin] Diarrhea and Nausea And Vomiting  . Nitrofurantoin Diarrhea and Nausea And Vomiting  . Penicillins Hives, Rash and Other (See Comments)    Blisters on hands and feet    ROS:  Out of a complete 14 system review of symptoms, the patient complains only of the following symptoms, and all other reviewed systems are negative.  Fatigue Chest pain, swelling in the legs Blurred vision, eye pain Shortness of  breath Anemia Feeling hot, cold Joint pain, joint swelling Headache, weakness Depression, too much sleep, decreased energy Insomnia, sleepiness, restless legs  Blood pressure 116/72, pulse 94, height 5\' 6"  (1.676 m), weight 198 lb 3.2 oz (89.903  kg).  Physical Exam  General: The patient is alert and cooperative at the time of the examination.  Eyes: Pupils are equal, round, and reactive to light. Discs are flat bilaterally.  Neck: The neck is supple, no carotid bruits are noted.  Respiratory: The respiratory examination is clear.  Cardiovascular: The cardiovascular examination reveals a regular rate and rhythm, no obvious murmurs or rubs are noted.  Skin: Extremities are without significant edema.  Neurologic Exam  Mental status: The patient is alert and oriented x 3 at the time of the examination. The patient has apparent normal recent and remote memory, with an apparently normal attention span and concentration ability.  Cranial nerves: Facial symmetry is present. There is good sensation of the face to pinprick and soft touch bilaterally. The strength of the facial muscles and the muscles to head turning and shoulder shrug are normal bilaterally. Speech is well enunciated, no aphasia or dysarthria is noted. Extraocular movements are full. Visual fields are full. The tongue is midline, and the patient has symmetric elevation of the soft palate. No obvious hearing deficits are noted.  Motor: The motor testing reveals 5 over 5 strength of all 4 extremities. Good symmetric motor tone is noted throughout.  Sensory: Sensory testing is intact to pinprick, soft touch, vibration sensation, and position sense on all 4 extremities. No evidence of extinction is noted.  Coordination: Cerebellar testing reveals good finger-nose-finger and heel-to-shin bilaterally.  Gait and station: Gait is normal. Tandem gait is normal. Romberg is negative. No drift is seen.  Reflexes: Deep tendon reflexes  are symmetric and normal bilaterally. Toes are downgoing bilaterally.   MRI lumbar 05/17/2014:  IMPRESSION: Summary of pertinent findings includes:  L4-5 moderate facet joint degenerative changes. Minimal to mild bulge. Very mild spinal stenosis. Additionally, there is a 3 mm right-sided synovial cyst immediately adjacent to the origin of the right L5 nerve root which is not compressed.  L5-S1 bulge with tiny annular fissure centrally. Left lateral prominent osteophyte with slight encroachment upon but not significant compression of the exiting left L5 nerve root.   Assessment/Plan:  1. Low back pain, left-sided sciatica  The patient has had a chronic issue with back pain and left leg pain, but recently she has noted a change in size of the distal thigh on the left. The patient will be set up for nerve conduction studies on both legs, and EMG of the left leg. She will follow-up for the above study. Clinical examination today does not show any significant issues with weakness of the left leg.  Jill Alexanders MD 06/08/2014 9:54 PM  [facility] [address] [place], [place] [address]  Phone [phone] Fax [phone]

## 2014-06-09 ENCOUNTER — Other Ambulatory Visit: Payer: Self-pay | Admitting: Family Medicine

## 2014-06-09 ENCOUNTER — Telehealth: Payer: Self-pay | Admitting: *Deleted

## 2014-06-09 NOTE — Telephone Encounter (Signed)
I called patient to schedule the NCV/EMG.  Patient refused to schedule at this time.  Wants to meditate on it and will call us if she decides to schedule.

## 2014-06-26 ENCOUNTER — Other Ambulatory Visit: Payer: Self-pay | Admitting: Family Medicine

## 2014-07-17 ENCOUNTER — Other Ambulatory Visit: Payer: Self-pay | Admitting: Family Medicine

## 2014-07-26 LAB — HM DIABETES EYE EXAM

## 2014-07-27 ENCOUNTER — Other Ambulatory Visit: Payer: Self-pay

## 2014-07-27 ENCOUNTER — Emergency Department (HOSPITAL_COMMUNITY)
Admission: EM | Admit: 2014-07-27 | Discharge: 2014-07-27 | Discharge status: Against Medical Advice | Payer: Medicare HMO | Attending: Emergency Medicine | Admitting: Emergency Medicine

## 2014-07-27 ENCOUNTER — Encounter (HOSPITAL_COMMUNITY): Payer: Self-pay | Admitting: Emergency Medicine

## 2014-07-27 DIAGNOSIS — R21 Rash and other nonspecific skin eruption: Secondary | ICD-10-CM | POA: Diagnosis not present

## 2014-07-27 DIAGNOSIS — E109 Type 1 diabetes mellitus without complications: Secondary | ICD-10-CM | POA: Insufficient documentation

## 2014-07-27 DIAGNOSIS — R1110 Vomiting, unspecified: Secondary | ICD-10-CM | POA: Diagnosis present

## 2014-07-27 DIAGNOSIS — I2510 Atherosclerotic heart disease of native coronary artery without angina pectoris: Secondary | ICD-10-CM | POA: Diagnosis not present

## 2014-07-27 DIAGNOSIS — I10 Essential (primary) hypertension: Secondary | ICD-10-CM | POA: Diagnosis not present

## 2014-07-27 DIAGNOSIS — G8929 Other chronic pain: Secondary | ICD-10-CM | POA: Diagnosis not present

## 2014-07-27 DIAGNOSIS — F0390 Unspecified dementia without behavioral disturbance: Secondary | ICD-10-CM | POA: Insufficient documentation

## 2014-07-27 MED ORDER — POTASSIUM CITRATE ER 10 MEQ (1080 MG) PO TBCR: EXTENDED_RELEASE_TABLET | ORAL | Status: DC

## 2014-07-27 NOTE — ED Notes (Signed)
No answer when called in all waiting areas

## 2014-07-27 NOTE — ED Notes (Signed)
Pt states that she has had occasional vomiting for the past 3 days with a rash across stomach that itches really bad.  States that she was called not long ago and told that her pcp put her on a medication that would interact with another one and she was afraid it may be in her system.

## 2014-07-29 ENCOUNTER — Other Ambulatory Visit: Payer: Self-pay

## 2014-07-29 ENCOUNTER — Emergency Department (HOSPITAL_COMMUNITY)
Admission: EM | Admit: 2014-07-29 | Discharge: 2014-07-29 | Disposition: A | Discharge status: Home / Self Care | Payer: Medicare HMO | Attending: Emergency Medicine | Admitting: Emergency Medicine

## 2014-07-29 ENCOUNTER — Other Ambulatory Visit: Payer: Self-pay | Admitting: Family Medicine

## 2014-07-29 ENCOUNTER — Encounter (HOSPITAL_COMMUNITY): Payer: Self-pay

## 2014-07-29 DIAGNOSIS — R21 Rash and other nonspecific skin eruption: Secondary | ICD-10-CM | POA: Diagnosis present

## 2014-07-29 DIAGNOSIS — F0390 Unspecified dementia without behavioral disturbance: Secondary | ICD-10-CM | POA: Diagnosis not present

## 2014-07-29 DIAGNOSIS — E109 Type 1 diabetes mellitus without complications: Secondary | ICD-10-CM | POA: Diagnosis not present

## 2014-07-29 DIAGNOSIS — Z87891 Personal history of nicotine dependence: Secondary | ICD-10-CM | POA: Diagnosis not present

## 2014-07-29 DIAGNOSIS — F329 Major depressive disorder, single episode, unspecified: Secondary | ICD-10-CM | POA: Diagnosis not present

## 2014-07-29 DIAGNOSIS — Z87442 Personal history of urinary calculi: Secondary | ICD-10-CM | POA: Diagnosis not present

## 2014-07-29 DIAGNOSIS — I2510 Atherosclerotic heart disease of native coronary artery without angina pectoris: Secondary | ICD-10-CM | POA: Insufficient documentation

## 2014-07-29 DIAGNOSIS — Z880 Allergy status to penicillin: Secondary | ICD-10-CM | POA: Insufficient documentation

## 2014-07-29 DIAGNOSIS — E785 Hyperlipidemia, unspecified: Secondary | ICD-10-CM | POA: Insufficient documentation

## 2014-07-29 DIAGNOSIS — Z862 Personal history of diseases of the blood and blood-forming organs and certain disorders involving the immune mechanism: Secondary | ICD-10-CM | POA: Diagnosis not present

## 2014-07-29 DIAGNOSIS — G8929 Other chronic pain: Secondary | ICD-10-CM | POA: Diagnosis not present

## 2014-07-29 DIAGNOSIS — Z79899 Other long term (current) drug therapy: Secondary | ICD-10-CM | POA: Diagnosis not present

## 2014-07-29 DIAGNOSIS — F419 Anxiety disorder, unspecified: Secondary | ICD-10-CM | POA: Diagnosis not present

## 2014-07-29 DIAGNOSIS — I10 Essential (primary) hypertension: Secondary | ICD-10-CM | POA: Diagnosis not present

## 2014-07-29 DIAGNOSIS — Z87448 Personal history of other diseases of urinary system: Secondary | ICD-10-CM | POA: Insufficient documentation

## 2014-07-29 DIAGNOSIS — Z791 Long term (current) use of non-steroidal anti-inflammatories (NSAID): Secondary | ICD-10-CM | POA: Insufficient documentation

## 2014-07-29 DIAGNOSIS — T7840XA Allergy, unspecified, initial encounter: Secondary | ICD-10-CM

## 2014-07-29 DIAGNOSIS — T380X5A Adverse effect of glucocorticoids and synthetic analogues, initial encounter: Secondary | ICD-10-CM | POA: Insufficient documentation

## 2014-07-29 LAB — CBG MONITORING, ED: Glucose-Capillary: 99 mg/dL (ref 70–99)

## 2014-07-29 MED ORDER — LORATADINE 10 MG PO TABS
10.0000 mg | ORAL_TABLET | Freq: Once | ORAL | Status: AC
  Administered 2014-07-29: 10 mg via ORAL
  Filled 2014-07-29: qty 1

## 2014-07-29 MED ORDER — HYDROXYZINE HCL 25 MG PO TABS: 25.0000 mg | ORAL_TABLET | Freq: Three times a day (TID) | ORAL | Status: DC | PRN

## 2014-07-29 MED ORDER — DEXAMETHASONE SODIUM PHOSPHATE 4 MG/ML IJ SOLN
10.0000 mg | Freq: Once | INTRAMUSCULAR | Status: AC
  Administered 2014-07-29: 10 mg via INTRAMUSCULAR
  Filled 2014-07-29: qty 3

## 2014-07-29 MED ORDER — FAMOTIDINE 20 MG PO TABS
40.0000 mg | ORAL_TABLET | Freq: Once | ORAL | Status: AC
  Administered 2014-07-29: 40 mg via ORAL
  Filled 2014-07-29: qty 2

## 2014-07-29 NOTE — ED Notes (Signed)
CBG- 99 

## 2014-07-29 NOTE — Discharge Instructions (Signed)
°Emergency Department Resource Guide °1) Find a Doctor and Pay Out of Pocket °Although you won't have to find out who is covered by your insurance plan, it is a good idea to ask around and get recommendations. You will then need to call the office and see if the doctor you have chosen will accept you as a new patient and what types of options they offer for patients who are self-pay. Some doctors offer discounts or will set up payment plans for their patients who do not have insurance, but you will need to ask so you aren't surprised when you get to your appointment. ° °2) Contact Your Local Health Department °Not all health departments have doctors that can see patients for sick visits, but many do, so it is worth a call to see if yours does. If you don't know where your local health department is, you can check in your phone book. The CDC also has a tool to help you locate your state's health department, and many state websites also have listings of all of their local health departments. ° °3) Find a Walk-in Clinic °If your illness is not likely to be very severe or complicated, you may want to try a walk in clinic. These are popping up all over the country in pharmacies, drugstores, and shopping centers. They're usually staffed by nurse practitioners or physician assistants that have been trained to treat common illnesses and complaints. They're usually fairly quick and inexpensive. However, if you have serious medical issues or chronic medical problems, these are probably not your best option. ° °No Primary Care Doctor: °- Call [facility] at  832-8000 - they can help you locate a primary care doctor that  accepts your insurance, provides certain services, etc. °- Physician Referral Service- 1-800-533-3463 ° °Chronic Pain Problems: °Organization         Address  Phone   Notes  °[facility]  (336) [phone] Patients need to be referred by their primary care doctor.  ° °Medication  Assistance: °Organization         Address  Phone   Notes  °[place] [facility] [address] °[place], [place] [address] (336) 641-8030 --Must be a resident of [place] °-- Must have NO insurance coverage whatsoever (no Medicaid/ Medicare, etc.) °-- The pt. MUST have a primary care doctor that directs their care regularly and follows them in the community °  °[facility]  (866) 331-1348   °[employer]  (888) 892-1162   ° °Agencies that provide inexpensive medical care: °Organization         Address  Phone   Notes  °[facility]  (336) 832-8035   °[facility]    (336) 832-7272   °[facility] [address] °[place], [place] [address] (336) 832-4777   °[facility] [address], °[place] (336) 271-4999   °[facility]    (336) 373-0678   °[facility]    (336) 272-1050   °[facility] ° [address], [place] Phone:  (336) 832-4444, Fax:  (336) 832-4440 Hours of Operation:  9 am - 6 pm, M-F.  Also accepts Medicaid/Medicare and self-pay.  °[facility] ° [address], [place] Phone: (336) 832-3150, Fax: (336) [phone]. Hours of Operation:  8:30 am - 5:30 pm, M-F.  Also accepts Medicaid and self-pay.  °[facility] [place] [address], [place] Phone: (336) 878-6027   °[facility] [address], [place], [place] (336)723-1848, Ext. 123 Mondays & Thursdays: 7-9 AM.  First 15 patients are seen on a first come, first serve basis. °  ° °Medicaid-accepting [place] Providers: ° °Organization         Address  Phone   Notes  °[facility] [address], [place] (336) 641-2100 Also accepts self-pay patients.  °[facility] [address], [place] ° (336) 856-9996   °[facility] [address], [place]  (336) 288-8857   °[facility] [address] [place] [address], [place] (336) 299-7000   °Veita Bland [address], [place]  ° (336) 373-1557 Only accepts [place] Access Medicaid patients after they have their name applied to their card.  ° °Self-Pay (no insurance) in [place]: ° °Organization         Address  Phone   Notes  °Sickle Cell Patients, [facility] [address], [place] (336) 832-1970   °[facility] [address], [place] (336) 832-4400   °[facility] ° [address], [place] (336) 992-4800   °[facility]/Dr. Osei-Bonsu ° [address] [place] [address], [place] or [address], [place] (336) [phone] Phone number for both [place] and [place] locations is the same.  °[facility] [address], [place] (336) 299-0000   °[facility] [address] [place] [address], [place] or [address] (336) 852-7530 °(336) 878-2260   °[facility] [address], [place] (336) 350-1642, phone; (336) 294-5005, fax Sees patients 1st and 3rd Saturday of every month.  Must not qualify for public or private insurance (i.e. Medicaid, Medicare, [place] Health Choice, Veterans' Benefits) • Household income should be no more than 200% of the poverty level •The clinic cannot treat you if you are pregnant or think you are pregnant • Sexually transmitted diseases are not treated at the clinic.  ° ° °Dental Care: °Organization         Address  Phone  Notes  °[place] [facility] [address], [place] (336) 641-6152 Accepts children up to age 21 who are enrolled in Medicaid or [place] Health Choice; pregnant women with a Medicaid card; and children who have applied for Medicaid or [place] Health Choice, but were declined, whose parents can pay a reduced fee at time of service.  °[place]  [facility] [place]  [address], [place] (336) 641-7733 Accepts children up to age 21 who are enrolled in Medicaid or [place] Health Choice; pregnant women with a Medicaid card; and children who have applied for Medicaid or [place] Health Choice, but were declined, whose parents can pay a reduced fee at time of service.  °[facility] ° [address],  (336) 641-4533 Patients are seen by appointment only. Walk-ins are not accepted. [facility] will see patients 18 years of age and older. °Monday - Tuesday (8am-5pm) °Most Wednesdays (8:30-5pm) °$30 per visit, cash only  °[facility] ° [address], [place] (336) 641-4533 Patients are seen by appointment only. Walk-ins are not accepted. [facility] will see patients 18 years of age and older. °One   Wednesday Evening (Monthly: Volunteer Based).  $30 per visit, cash only  °[facility]  (919) 537-3737 for adults; Children under age 4, call Graduate Pediatric Dentistry at ([phone]) [phone]. Children aged 4-14, please call (919) [phone] to request a pediatric application. ° Dental services are provided in all areas of dental care including fillings, crowns and bridges, complete and partial dentures, implants, gum treatment, root canals, and extractions. Preventive care is also provided. Treatment is provided to both adults and children. °Patients are selected via a lottery and there is often a waiting list. °  °[facility] [address], °[place] ° (336) 763-8833 [email or web address] °  °[facility] [address], [place], [place] (336)723-1848, Ext. 123 Second and Fourth Thursday of each month, opens at 6:30 AM; Clinic ends at 9 AM.  Patients are seen on a first-come first-served basis, and a limited number are seen during each clinic.  ° °[facility] ° [address], [place], [place] (336) [phone]    Eligibility Requirements °You must have lived in [place], or [place] counties for at least the last three months. °  You cannot be eligible for state or federal sponsored healthcare insurance, including Veterans Administration, Medicaid, or Medicare. °  You generally cannot be eligible for healthcare insurance through your employer.  °  How to apply: °Eligibility screenings are held every Tuesday and Wednesday afternoon from 1:00 pm until 4:00 pm. You do not need an appointment for the interview!  °[facility] [address], [place], [place] 336-631-2330   °[facility]  336-342-8273   °[facility]  336-703-3100   °[facility]  336-570-6415   ° °Behavioral Health Resources in the Community: °Intensive Outpatient Programs °Organization         Address  Phone  Notes  °[facility] [address], [place], [place] 336-878-6098   °[facility] [address], [place], [place] 336-832-9800   °[facility] [address], [place], [place] ° 336-882-2125   °[facility] [address],  °[place], [place] 1-800-853-5163 or 336-641-4981   °Substance Abuse Resources °Organization         Address  Phone  Notes  °[facility]  336-882-2125   °[facility]  336-784-9470   °[facility]  336-285-9073   °[facility]  336-845-3988   °Residential & Outpatient Substance Abuse Program  1-800-659-3381   °Psychological Services °Organization         Address  Phone  Notes  °[facility]  336- 832-9600   °[facility]  336- 378-7881   °[facility] [address], [place] 1-800-853-5163 or 336-641-4981   ° °Mobile Crisis Teams °Organization         Address  Phone  Notes  °[facility], Mobile Crisis Care Unit  1-877-626-1772   °Assertive °[facility] ° [address]  [place], [place] 336-834-9664   °Sharon DeEsch [address] °[place]  336-554-5454   ° °Self-Help/Support Groups °Organization         Address  Phone             Notes  °[facility] - variety of support groups  336- 373-1402 Call for more information  °Narcotics Anonymous (NA), [facility] [address], °[place]   2 meetings at this location  ° °  Residential Treatment Programs Organization         Address  Phone  Notes  [facility] [address]    [place]  1-2366063933   [facility]  [address] [place] [id number], [place], [place]   [place], [place] (548)404-2162 Admissions: 8am-3pm M-F  [facility] [place] [address]    [place], [place] [phone]   [facility] [address] [place], [place], [place]   [facility] [address]  [place], [place]   [facility] Intensive Outpatient [address], [place], [place]   [facility] ([place]  [place], [place] 1-(985)087-5979 or (520)[phone]   [facility] ([facility] [address] [place], [place] Accepts Medicaid  [facility] [address]  [place] 1-(431)583-8872 Substance Abuse/Addiction Treatment   [facility]         Address  Phone  Notes  [facility]  708-563-6022   Domenic Schwab, PhD [address] [place], [place]   [phone] or 640-440-6147   [place], [place] [phone]   [facility] [address], [place], [place] (917)448-4839 Insurance/Medicaid/sponsorship through [facility] and Families [address]                                    [place], [place] [phone] [place] [address] [place], [place] [phone]    Dr. Adele Schilder  [phone]   [facility] Dept. 1) [address] [place] 2) [place] 3)  [address], [place] [phone]  [phone]   [place] 510-872-3044 or [phone] (After Hours)      Take the prescription as directed. If the prescription "itching medicine" is too sedating, take an over the counter non-sedating antihistamine such as claritin, allegra or zyrtec, as directed on packaging.  Call your regular medical doctor tomorrow to schedule a follow up appointment within the next 2 days.  Return to the Emergency Department immediately sooner if worsening.

## 2014-07-29 NOTE — ED Notes (Signed)
Pt's cbg was 55.  Pt says hasn't eaten since breakfast.  Pt given peanutbutter, graham crackers, and coke to snack on.  Pt in waiting room behind triage for closer monitoring.

## 2014-07-29 NOTE — ED Notes (Signed)
Pt alert & oriented x4, stable gait. Patient  given discharge instructions, paperwork & prescription(s).  Patient verbalized understanding. Pt left department in wheelchair w/ no further questions. 

## 2014-07-29 NOTE — ED Provider Notes (Signed)
CSN: [id number]     Arrival date & time 07/29/14  1755 History   First MD Initiated Contact with Patient 07/29/14 2136     Chief Complaint  Patient presents with  . Rash  . Pruritis     HPI Pt was seen at 2140. Per pt, c/o gradual onset and persistence of constant "itching rash" to her abd wall for the past 2 weeks. Pt states she was started on "a new BP med" 2 weeks ago, but stopped it 1 week ago "because I thought it was making me itch." Pt states she called her PMD and was told to take benadryl, but she did not. Denies SOB, no wheezing, no intra-oral edema, no CP/SOB, no sore throat.    Past Medical History  Diagnosis Date  . Anxiety disorder   . Diabetes mellitus type 1   . Hyperlipidemia   . Hypertension   . Sleep apnea   . Disease of vein   . Coronary artery disease   . Complication of anesthesia     pt states she was not given enough medication with  tcs with Dr Tamala Julian                                    last [place].as able to feel and hear everything.  . Depression   . DEMENTIA   . Renal insufficiency   . Kidney stones   . DDD (degenerative disc disease), lumbar   . Sciatica   . Chronic back pain   . Chronic shoulder pain   . Headache   . ANEMIA 09/08/2009    DEC 2014 Hb 13.5     Past Surgical History  Procedure Laterality Date  . Partial hysterectomy    . Lithotryspy and stent replacement rt ureter  10/2004    12/2009 Dr, Michela Pitcher   . Removal of lump from posterior r thigh    . Colonoscopy  08/21/06    adenomatous polyps/  . Cardiac catheterization  2005  . Flexible sigmoidoscopy  09/11/2011    [id number] Internal hemorrhoids  . Extraction of left kidney stone    . [facility]    . Colonoscopy  10/02/2011    [id number] SIGMOID COLON DIVERTICULOSIS/hemorrhoids  . Esophagogastroduodenoscopy (egd) with propofol N/A 03/17/2013    Procedure: ESOPHAGOGASTRODUODENOSCOPY (EGD) WITH PROPOFOL;  Surgeon: Danie Binder, MD;   Location: [facility];  Service: Endoscopy;  Laterality: N/A;  . Savory dilation N/A 03/17/2013    Procedure: SAVORY DILATION;  Surgeon: Danie Binder, MD;  Location: [facility];  Service: Endoscopy;  Laterality: N/A;  #12.8, 14, 15, 16 dilators used  . Esophageal biopsy N/A 03/17/2013    Procedure: GASTRIC BIOPSIES;  Surgeon: Danie Binder, MD;  Location: [facility];  Service: Endoscopy;  Laterality: N/A;  . Polypectomy N/A 03/17/2013    Procedure: GASTRIC POLYPECTOMY;  Surgeon: Danie Binder, MD;  Location: [facility];  Service: Endoscopy;  Laterality: N/A;  . Abdominal hysterectomy     Family History  Problem Relation Age of Onset  . Hypertension Mother   . Diabetes Mother   . Heart failure Mother   . Dementia Mother   . Emphysema Father   . Hypertension Father   . Diabetes Brother   . GER disease Brother   . Cancer      family history   . Diabetes  family history   . Heart defect      famiily history   . Arthritis      family history   . Anesthesia problems Neg Hx   . Hypotension Neg Hx   . Malignant hyperthermia Neg Hx   . Pseudochol deficiency Neg Hx   . Colon cancer Neg Hx    History  Substance Use Topics  . Smoking status: Former Smoker -- 1.00 packs/day for 20 years    Types: Cigarettes    Quit date: 09/21/1994  . Smokeless tobacco: Never Used  . Alcohol Use: No   OB History    Gravida Para Term Preterm AB TAB SAB Ectopic Multiple Living   2 2 2       2      Review of Systems ROS: Statement: All systems negative except as marked or noted in the HPI; Constitutional: Negative for fever and chills. ; ; Eyes: Negative for eye pain, redness and discharge. ; ; ENMT: Negative for ear pain, hoarseness, nasal congestion, sinus pressure and sore throat. ; ; Cardiovascular: Negative for chest pain, palpitations, diaphoresis, dyspnea and peripheral edema. ; ; Respiratory: Negative for cough, wheezing and stridor. ; ; Gastrointestinal: Negative for nausea, vomiting, diarrhea, abdominal  pain, blood in stool, hematemesis, jaundice and rectal bleeding. . ; ; Genitourinary: Negative for dysuria, flank pain and hematuria. ; ; Musculoskeletal: Negative for back pain and neck pain. Negative for swelling and trauma.; ; Skin: +rash, itching. Negative for abrasions, blisters, bruising and skin lesion.; ; Neuro: Negative for headache, lightheadedness and neck stiffness. Negative for weakness, altered level of consciousness , altered mental status, extremity weakness, paresthesias, involuntary movement, seizure and syncope.     Allergies  Ace inhibitors; Keflex; Nitrofurantoin; and Penicillins  Home Medications   Prior to Admission medications   Medication Sig Start Date End Date Taking? Authorizing Provider  ALPRAZolam Duanne Moron) 0.5 MG tablet TAKE 1 TABLET BY MOUTH TWICE A DAY 05/18/14  Yes [name], MD  amLODipine (NORVASC) 10 MG tablet Take 10 mg by mouth daily. 05/13/14  Yes Historical Provider, MD  lovastatin (MEVACOR) 40 MG tablet TAKE 2 TABLETS BY MOUTH AT BEDTIME 04/03/14  Yes [name], MD  metFORMIN (GLUCOPHAGE) 500 MG tablet TAKE 2 TABLETS (1,000 MG TOTAL) BY MOUTH 2 (TWO) TIMES DAILY WITH A MEAL. 03/24/14  Yes [name], MD  pantoprazole (PROTONIX) 40 MG tablet Take 1 tablet (40 mg total) by mouth 2 (two) times daily. 10/12/13  Yes Orvil Feil, NP  PARoxetine (PAXIL) 40 MG tablet TAKE 1 TABLET BY MOUTH EVERY MORNING 12/23/13  Yes [name], MD  potassium citrate (UROCIT-K) 10 MEQ (1080 MG) SR tablet TAKE 1 TABLET 3 TIMES DAILY WITH MEALS. 07/27/14  Yes [name], MD  traMADol (ULTRAM) 50 MG tablet TAKE 1 TABLET TWICE A DAY 07/19/14  Yes [name], MD  ibuprofen (ADVIL,MOTRIN) 800 MG tablet Take 1 tablet (800 mg total) by mouth daily as needed. Patient not taking: Reported on 07/29/2014 03/29/14   [name], MD  LORazepam (ATIVAN) 1 MG tablet One tablet 30 minutes before test, repeat after 15 minutes if needed, one time  only Patient not taking: Reported on 07/29/2014 05/11/14   [name], MD  Vitamin D, Ergocalciferol, (DRISDOL) 50000 UNITS CAPS capsule TAKE 1 CAPSULE (50,000 UNITS TOTAL) BY MOUTH EVERY 7 (SEVEN) DAYS. 06/10/14   [name], MD   BP 135/88 mmHg  Pulse 112  Temp(Src) 98.6  F (37 C) (Oral)  Resp 17  Ht 5\' 6"  (1.676 m)  Wt 195 lb (88.451 kg)  BMI 31.49 kg/m2  SpO2 93% Physical Exam  2145: Physical examination:  Nursing notes reviewed; Vital signs and O2 SAT reviewed;  Constitutional: Well developed, Well nourished, Well hydrated, In no acute distress; Head:  Normocephalic, atraumatic; Eyes: EOMI, PERRL, No scleral icterus; ENMT: Mouth and pharynx normal, Mucous membranes moist Mouth and pharynx without lesions. No tonsillar exudates. No intra-oral edema. No submandibular or sublingual edema. No hoarse voice, no drooling, no stridor. No pain with manipulation of larynx. No trismus.; Neck: Supple, Full range of motion, No lymphadenopathy; Cardiovascular: Regular rate and rhythm, No gallop; Respiratory: Breath sounds clear & equal bilaterally, No wheezes.  Speaking full sentences with ease, Normal respiratory effort/excursion; Chest: Nontender, Movement normal; Abdomen: Soft, Nontender, Nondistended, Normal bowel sounds; Genitourinary: No CVA tenderness; Extremities: Pulses normal, No tenderness, No edema, No calf edema or asymmetry.; Neuro: AA&Ox3, Major CN grossly intact.  Speech clear. No gross focal motor or sensory deficits in extremities.; Skin: Color normal, Warm, Dry. +hives to torso that pt is scratching at during exam. No blisters, no vesicles, no petechiae.    ED Course  Procedures      EKG Interpretation None      MDM  MDM Reviewed: previous chart, nursing note and vitals      2315:  Meds given with good effect. Pt is no longer scratching herself constantly and rash appears to have faded. Pt has tol PO well while in the ED without N/V during her 5+ hour ED  visit.  Will continue to tx symptomatically at this time. Pt refusing prednisone because "it keeps me awake." IM decadron given. Dx d/w pt.  Questions answered.  Verb understanding, agreeable to d/c home with outpt f/u.   Francine Graven, DO 08/01/14 [phone]

## 2014-07-29 NOTE — ED Notes (Signed)
Pt states itching for the past 2 weeks, called her PCP & was advised to take benadryl but did not take any. Pt denies any new meds, foods, clothes or soaps.

## 2014-07-29 NOTE — ED Notes (Signed)
Pt reports chills and a rash over 1 week.  Also reports vomiting and diarrhea.  Reports v/d has been intermittent for 2 weeks.

## 2014-07-29 NOTE — ED Notes (Signed)
Pt resting calmly w/ eyes closed. Rise & fall of the chest noted. NAD noted at this time.   

## 2014-07-29 NOTE — ED Notes (Signed)
Pt ate her snack and cbg 93.

## 2014-07-30 LAB — CBG MONITORING, ED
Glucose-Capillary: 146 mg/dL — ABNORMAL HIGH (ref 70–99)
Glucose-Capillary: 55 mg/dL — ABNORMAL LOW (ref 70–99)
Glucose-Capillary: 93 mg/dL (ref 70–99)

## 2014-08-01 MED ORDER — PANTOPRAZOLE SODIUM 40 MG PO TBEC: 40.0000 mg | DELAYED_RELEASE_TABLET | Freq: Two times a day (BID) | ORAL | Status: DC

## 2014-08-04 ENCOUNTER — Encounter: Payer: Self-pay | Admitting: Family Medicine

## 2014-08-04 ENCOUNTER — Ambulatory Visit (INDEPENDENT_AMBULATORY_CARE_PROVIDER_SITE_OTHER): Payer: Medicare HMO | Admitting: Family Medicine

## 2014-08-04 VITALS — BP 104/66 | HR 95 | Resp 18 | Ht 66.0 in | Wt 196.0 lb

## 2014-08-04 DIAGNOSIS — I10 Essential (primary) hypertension: Secondary | ICD-10-CM

## 2014-08-04 DIAGNOSIS — B354 Tinea corporis: Secondary | ICD-10-CM

## 2014-08-04 DIAGNOSIS — E1121 Type 2 diabetes mellitus with diabetic nephropathy: Secondary | ICD-10-CM

## 2014-08-04 DIAGNOSIS — L299 Pruritus, unspecified: Secondary | ICD-10-CM

## 2014-08-04 DIAGNOSIS — E669 Obesity, unspecified: Secondary | ICD-10-CM

## 2014-08-04 MED ORDER — METHYLPREDNISOLONE ACETATE 80 MG/ML IJ SUSP
40.0000 mg | Freq: Once | INTRAMUSCULAR | Status: AC
  Administered 2014-08-04: 40 mg via INTRAMUSCULAR

## 2014-08-04 MED ORDER — CLOTRIMAZOLE-BETAMETHASONE 1-0.05 % EX CREA: 1.0000 "application " | TOPICAL_CREAM | Freq: Two times a day (BID) | CUTANEOUS | Status: DC

## 2014-08-04 MED ORDER — TERBINAFINE HCL 250 MG PO TABS: 250.0000 mg | ORAL_TABLET | Freq: Every day | ORAL | Status: DC

## 2014-08-04 MED ORDER — HYDROXYZINE HCL 25 MG PO TABS: ORAL_TABLET | ORAL | Status: DC

## 2014-08-04 NOTE — Patient Instructions (Signed)
F/u as before  You are treated for fungal infection affecting different areas of your body, causing you to itch, mainly the neck and ruight lower chest  Tablets are prescribed for 1 month, terbinafine one daily, and an antifungal cream apply twice daily to rash  Pill for itch as needed, use one in the morning and two at night  Sleep with gloves on so you do not scratch yourself while asleep  Depo  Medrol is given at visit today to help with itch   Non fasting cmp and EGFr, hBA1C and vit D before your March visit please (5 to 7 dayss)

## 2014-08-04 NOTE — Assessment & Plan Note (Signed)
Unchanged. Patient re-educated about  the importance of commitment to a  minimum of 150 minutes of exercise per week. The importance of healthy food choices with portion control discussed. Encouraged to start a food diary, count calories and to consider  joining a support group. Sample diet sheets offered. Goals set by the patient for the next several months.    

## 2014-08-04 NOTE — Assessment & Plan Note (Signed)
Controlled, no change in medication  

## 2014-08-04 NOTE — Progress Notes (Signed)
   Subjective:    Patient ID: Cheryl Abbott, female    DOB: Dec 16, 1945, 69 y.o.   MRN: [id number]  HPI 1 month h/o pruritic rash in neck and upper back, no new toiletries or personal care products No skin breakdown or purulent drainage from the rash Has been to the ED twice in past 1 week, steroid injection helped itch temporarily Denies polyuria, polydipsia, blurred vision , or hypoglycemic episodes. Other    Review of Systems See HPI Denies recent fever or chills. Denies sinus pressure, nasal congestion, ear pain or sore throat. Denies chest congestion, productive cough or wheezing. Denies chest pains, palpitations and leg swelling Denies abdominal pain, nausea, vomiting,diarrhea or constipation.   Denies dysuria, frequency, hesitancy or incontinence. Denies joint pain, swelling and limitation in mobility. Denies headaches, seizures, numbness, or tingling. Denies uncontrolled  depression, anxiety or insomnia.        Objective:   Physical Exam BP 104/66 mmHg  Pulse 95  Resp 18  Ht 5\' 6"  (1.676 m)  Wt 196 lb 0.6 oz (88.923 kg)  BMI 31.66 kg/m2  SpO2 100% Patient alert and oriented and in no cardiopulmonary distress.Itching neck and upper body throughout exam  HEENT: No facial asymmetry, EOMI,   oropharynx pink and moist.  Neck supple no JVD, no mass.  Chest: Clear to auscultation bilaterally.  CVS: S1, S2 no murmurs, no S3.Regular rate.  ABD: Soft non tender.   Ext: No edema  MS: Adequate ROM spine, shoulders, hips and knees.  Skin: Intact, fungal rash on neck and right posterior chest lower aspect. Small amt of rash under breasts, no skin breakdown, no bacterial superinfection  Psych: Good eye contact, normal affect. Memory intact not anxious or depressed appearing.  CNS: CN 2-12 intact, power,  normal throughout.no focal deficits noted.        Assessment & Plan:  Tinea corporis Terbinafine and clotrimazole/betameth Hydroxyzine up to 3 per day    Depo medrol in office   Hypertension goal BP (blood pressure) < 130/80 Controlled, no change in medication    Obesity (BMI 30.0-34.9) Unchanged. Patient re-educated about  the importance of commitment to a  minimum of 150 minutes of exercise per week. The importance of healthy food choices with portion control discussed. Encouraged to start a food diary, count calories and to consider  joining a support group. Sample diet sheets offered. Goals set by the patient for the next several months.

## 2014-08-04 NOTE — Assessment & Plan Note (Signed)
Terbinafine and clotrimazole/betameth Hydroxyzine up to 3 per day  Depo medrol in office

## 2014-08-07 ENCOUNTER — Other Ambulatory Visit: Payer: Self-pay | Admitting: Family Medicine

## 2014-08-21 IMAGING — CR DG ABDOMEN 1V
1 series · 1 of 1 positions shown · non-contrast
Comparison: August 11, 2011

CLINICAL DATA: Renal calculus

ABDOMEN - 1 VIEW

[view not recorded]
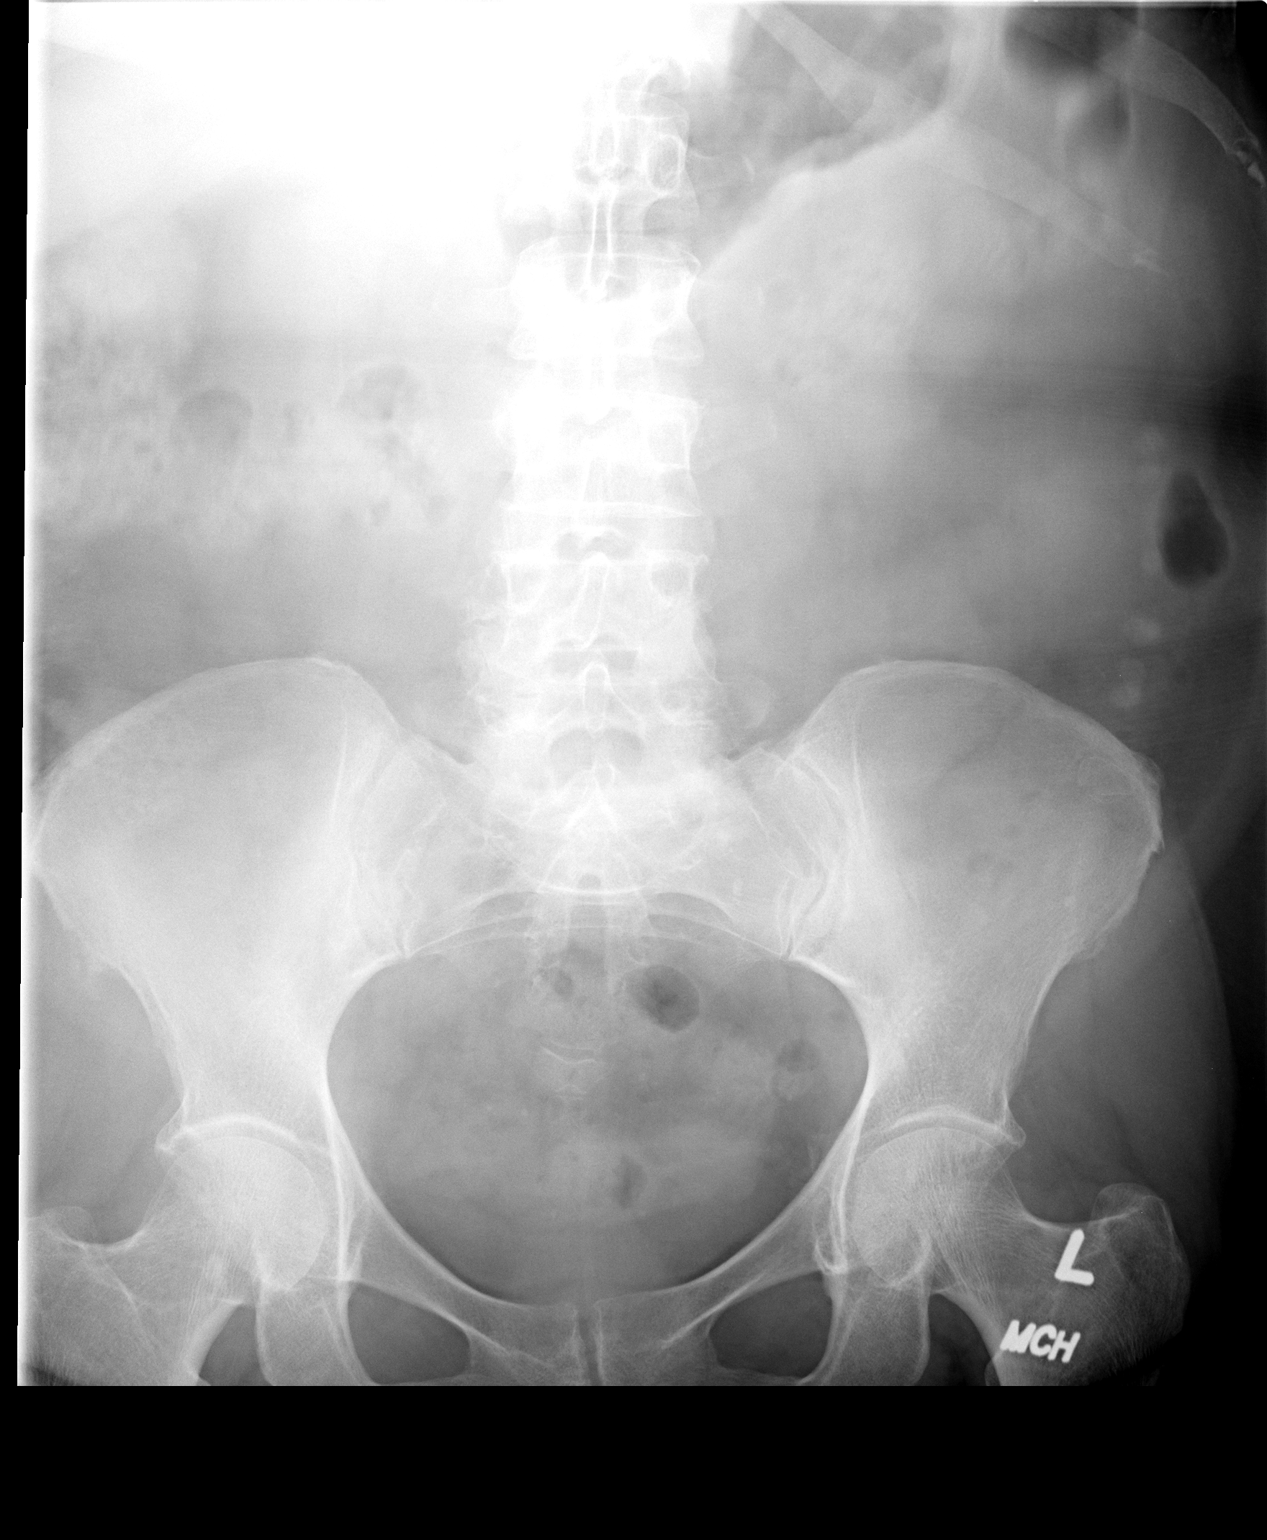

[1 of 1 positions shown; findings below may reference images not displayed]

FINDINGS: The bowel gas pattern is normal.  There is moderate stool
in the colon.

There are calcifications to the right of L1, largest measuring 6
mm.  Small calcifications in the pelvis probably represent
phleboliths.
IMPRESSION: Calcification to the right of L1, possibly within the
kidneys or near the ureteropelvic junction on the right.

Bowel gas pattern normal.

Noncontrast CT would be the imaging study of choice for optimal
assessment for renal or ureteral calculi.

## 2014-08-26 ENCOUNTER — Telehealth: Payer: Self-pay | Admitting: Family Medicine

## 2014-08-27 ENCOUNTER — Other Ambulatory Visit: Payer: Self-pay | Admitting: Family Medicine

## 2014-08-27 MED ORDER — AMLODIPINE BESYLATE 10 MG PO TABS: 10.0000 mg | ORAL_TABLET | Freq: Every day | ORAL | Status: DC

## 2014-08-27 MED ORDER — ALPRAZOLAM 0.5 MG PO TABS: ORAL_TABLET | ORAL | Status: DC

## 2014-08-27 NOTE — Telephone Encounter (Signed)
meds sent

## 2014-08-30 ENCOUNTER — Telehealth: Payer: Self-pay | Admitting: Family Medicine

## 2014-08-30 MED ORDER — VITAMIN D (ERGOCALCIFEROL) 1.25 MG (50000 UNIT) PO CAPS: ORAL_CAPSULE | ORAL | Status: DC

## 2014-08-30 MED ORDER — ALPRAZOLAM 0.5 MG PO TABS: ORAL_TABLET | ORAL | Status: DC

## 2014-08-30 NOTE — Telephone Encounter (Signed)
meds have been sent in

## 2014-09-03 IMAGING — MG MM DIGITAL SCREENING
5 series · 5 of 5 positions shown · non-contrast
Comparison: Previous exams.

CLINICAL DATA: Screening.

DIGITAL BILATERAL SCREENING MAMMOGRAM WITH CAD

[L CC]
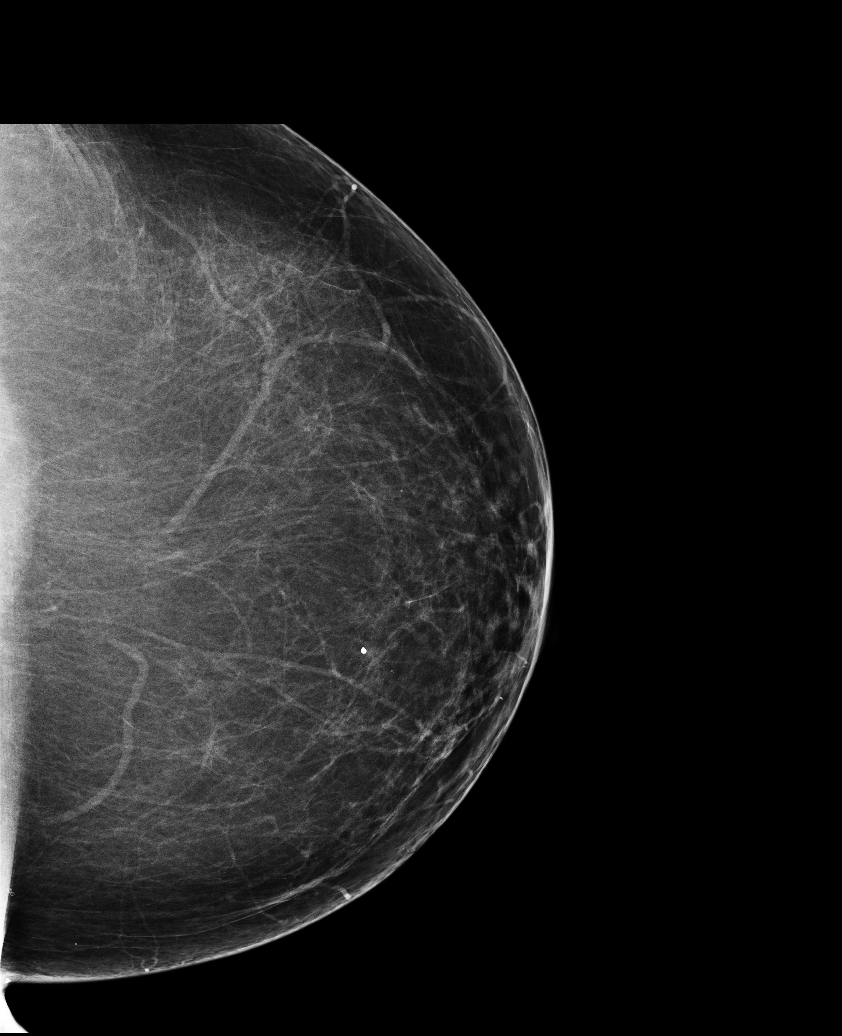

[L MLO (1 of 2)]
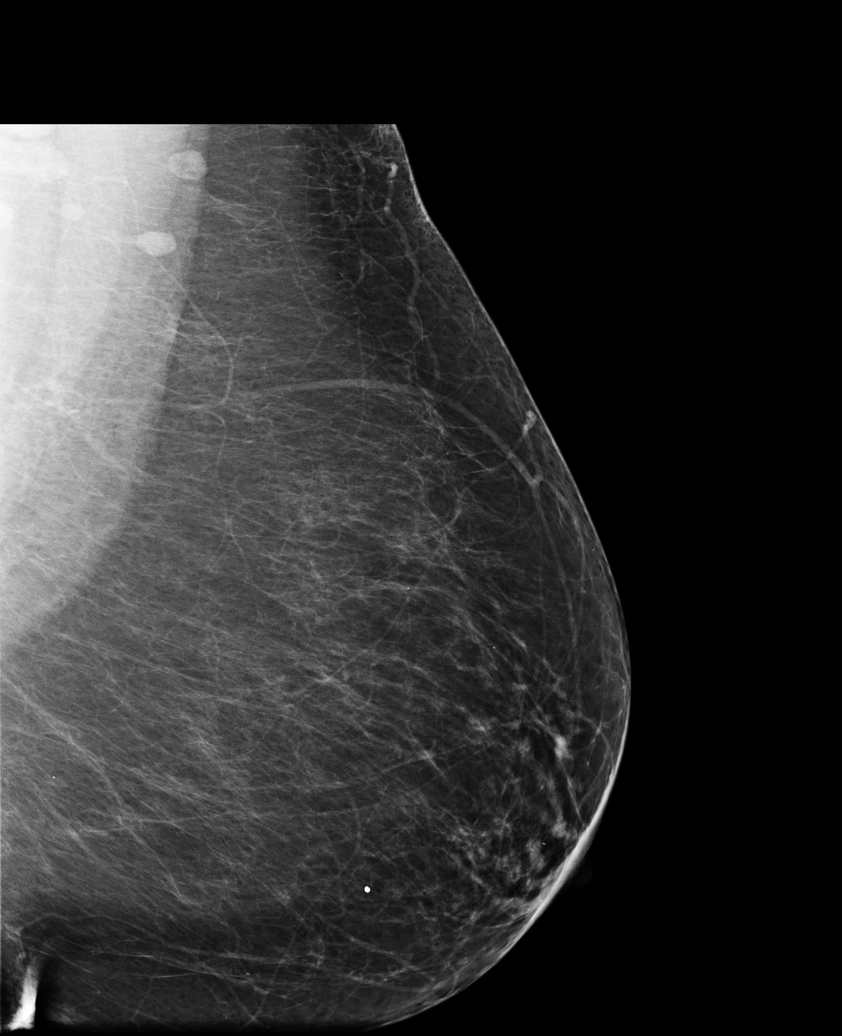

[R CC]
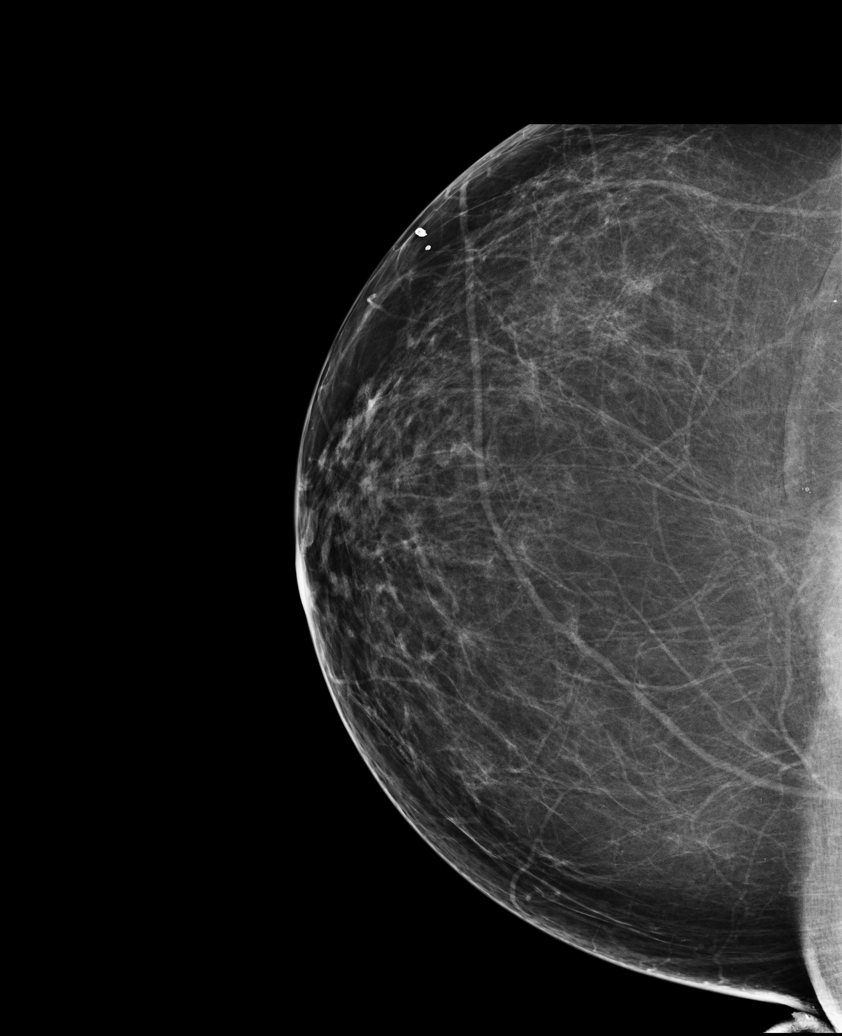

[R MLO]
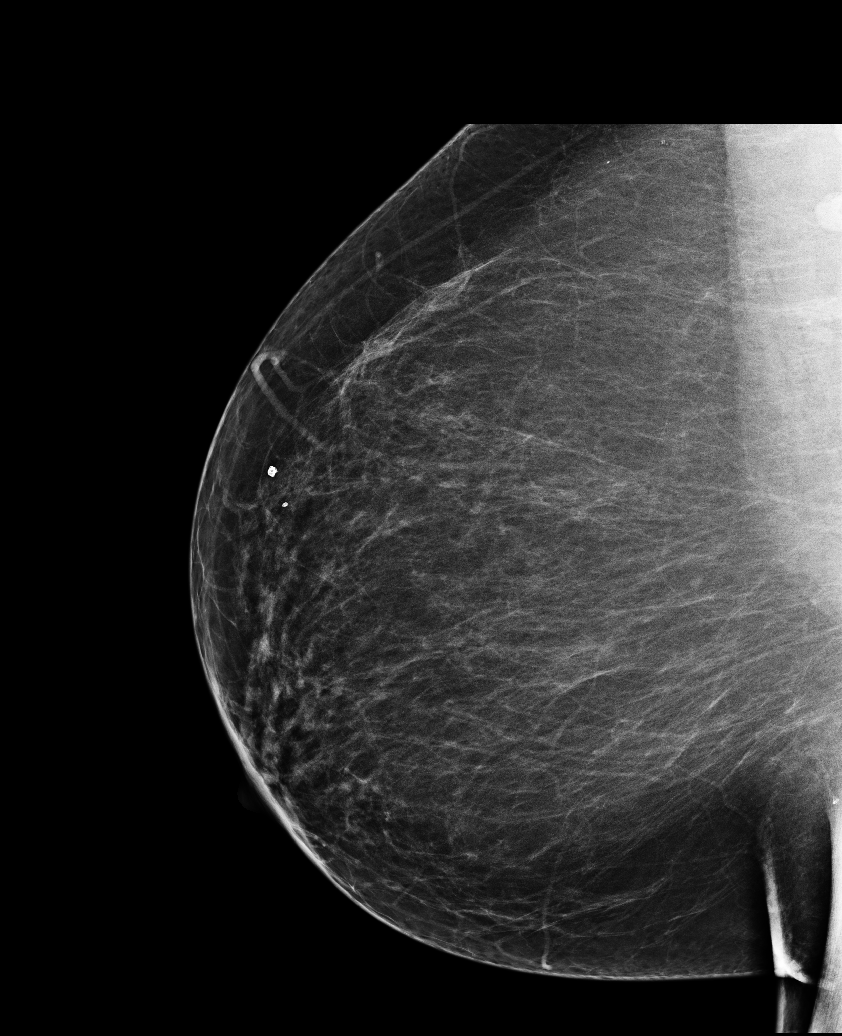

[L MLO (2 of 2)]
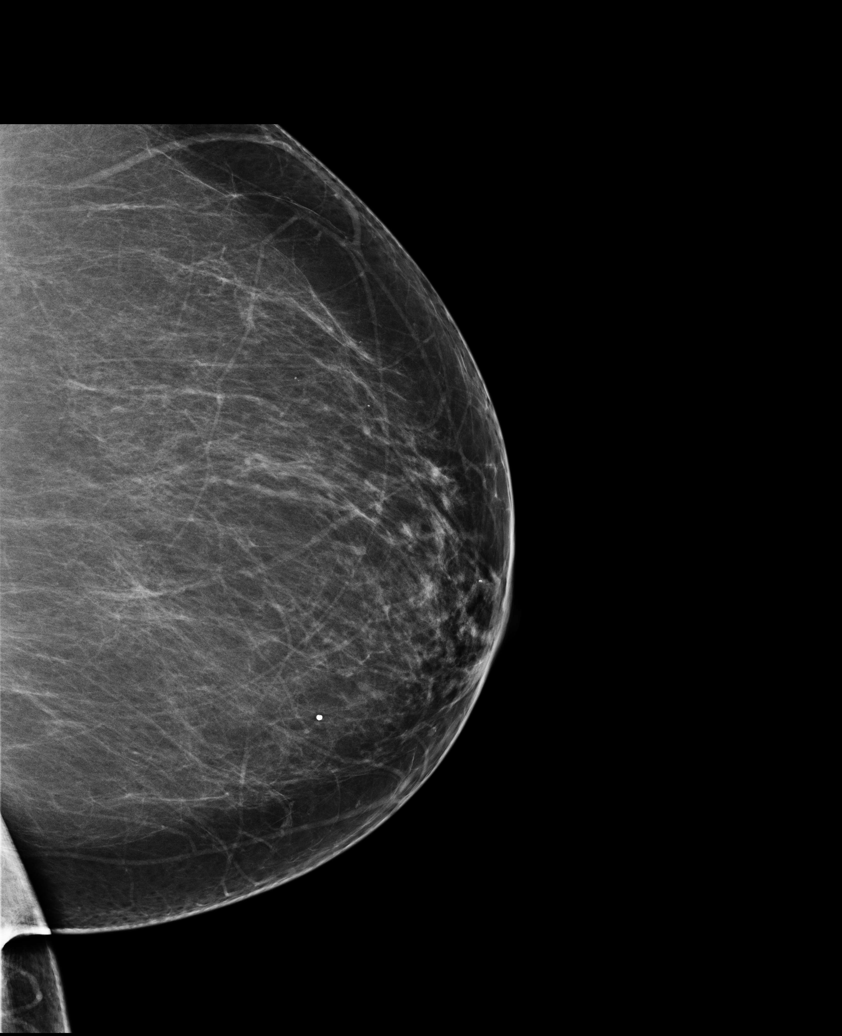

[5 of 5 positions shown; findings below may reference images not displayed]

FINDINGS: The breast tissue is almost entirely fatty. No suspicious
masses, architectural distortion, or calcifications are present.

Images were processed with CAD.
IMPRESSION: No mammographic evidence of malignancy.

A result letter of this screening mammogram will be mailed directly
to the patient.

RECOMMENDATION:
Screening mammogram in one year. (Code:5Y-M-L37)

BI-RADS CATEGORY 1:  Negative.

## 2014-09-09 ENCOUNTER — Ambulatory Visit (INDEPENDENT_AMBULATORY_CARE_PROVIDER_SITE_OTHER): Payer: Medicare HMO | Admitting: Family Medicine

## 2014-09-09 ENCOUNTER — Encounter: Payer: Self-pay | Admitting: Family Medicine

## 2014-09-09 VITALS — BP 134/82 | HR 92 | Resp 16 | Ht 66.0 in | Wt 190.0 lb

## 2014-09-09 DIAGNOSIS — K219 Gastro-esophageal reflux disease without esophagitis: Secondary | ICD-10-CM | POA: Diagnosis not present

## 2014-09-09 DIAGNOSIS — M5417 Radiculopathy, lumbosacral region: Secondary | ICD-10-CM | POA: Diagnosis not present

## 2014-09-09 DIAGNOSIS — E1121 Type 2 diabetes mellitus with diabetic nephropathy: Secondary | ICD-10-CM

## 2014-09-09 DIAGNOSIS — F411 Generalized anxiety disorder: Secondary | ICD-10-CM

## 2014-09-09 DIAGNOSIS — E785 Hyperlipidemia, unspecified: Secondary | ICD-10-CM

## 2014-09-09 DIAGNOSIS — F32A Depression, unspecified: Secondary | ICD-10-CM

## 2014-09-09 DIAGNOSIS — I10 Essential (primary) hypertension: Secondary | ICD-10-CM

## 2014-09-09 DIAGNOSIS — M5416 Radiculopathy, lumbar region: Secondary | ICD-10-CM

## 2014-09-09 DIAGNOSIS — E669 Obesity, unspecified: Secondary | ICD-10-CM

## 2014-09-09 DIAGNOSIS — F329 Major depressive disorder, single episode, unspecified: Secondary | ICD-10-CM

## 2014-09-09 LAB — COMPLETE METABOLIC PANEL WITH GFR
ALT: 15 U/L (ref 0–35)
AST: 18 U/L (ref 0–37)
Albumin: 4.5 g/dL (ref 3.5–5.2)
Alkaline Phosphatase: 92 U/L (ref 39–117)
BUN: 16 mg/dL (ref 6–23)
CO2: 27 mEq/L (ref 19–32)
Calcium: 10.5 mg/dL (ref 8.4–10.5)
Chloride: 101 mEq/L (ref 96–112)
Creat: 1.11 mg/dL — ABNORMAL HIGH (ref 0.50–1.10)
GFR, Est African American: 58 mL/min — ABNORMAL LOW
GFR, Est Non African American: 50 mL/min — ABNORMAL LOW
Glucose, Bld: 154 mg/dL — ABNORMAL HIGH (ref 70–99)
Potassium: 4.1 mEq/L (ref 3.5–5.3)
Sodium: 135 mEq/L (ref 135–145)
Total Bilirubin: 0.2 mg/dL (ref 0.2–1.2)
Total Protein: 8.4 g/dL — ABNORMAL HIGH (ref 6.0–8.3)

## 2014-09-09 LAB — HEMOGLOBIN A1C
Hgb A1c MFr Bld: 7.1 % — ABNORMAL HIGH (ref <5.7)
Mean Plasma Glucose: 157 mg/dL — ABNORMAL HIGH (ref <117)

## 2014-09-09 LAB — VITAMIN D 25 HYDROXY (VIT D DEFICIENCY, FRACTURES): Vit D, 25-Hydroxy: 36 ng/mL (ref 30–100)

## 2014-09-09 MED ORDER — GABAPENTIN 100 MG PO CAPS: 100.0000 mg | ORAL_CAPSULE | Freq: Every day | ORAL | Status: DC

## 2014-09-09 MED ORDER — PAROXETINE HCL 40 MG PO TABS: 40.0000 mg | ORAL_TABLET | Freq: Every morning | ORAL | Status: DC

## 2014-09-09 MED ORDER — METFORMIN HCL 500 MG PO TABS: ORAL_TABLET | ORAL | Status: DC

## 2014-09-09 MED ORDER — AMLODIPINE BESYLATE 10 MG PO TABS: 10.0000 mg | ORAL_TABLET | Freq: Every day | ORAL | Status: DC

## 2014-09-09 NOTE — Patient Instructions (Addendum)
Annul wellness in 4.5 month, call if you need me before  Excellent weight loss, keep it up  Change form sugary snacks to fresh fruit and veg  CBC, fasting lipid,cmp and EGFr, hBA1C and TSH in 4.5 month  Blood sugar has increased so please work on healthy diet and exercise 30 min 5 days per week  You are referred to Dr Oneida Alar as discussed

## 2014-09-10 ENCOUNTER — Encounter: Payer: Self-pay | Admitting: Gastroenterology

## 2014-09-13 ENCOUNTER — Other Ambulatory Visit: Payer: Self-pay | Admitting: Family Medicine

## 2014-09-21 ENCOUNTER — Ambulatory Visit (HOSPITAL_COMMUNITY)
Admission: RE | Admit: 2014-09-21 | Discharge: 2014-09-21 | Disposition: A | Discharge status: Home / Self Care | Payer: Medicare HMO | Source: Ambulatory Visit | Attending: Urology | Admitting: Urology

## 2014-09-21 ENCOUNTER — Other Ambulatory Visit: Payer: Self-pay | Admitting: Urology

## 2014-09-21 DIAGNOSIS — N200 Calculus of kidney: Secondary | ICD-10-CM

## 2014-09-21 DIAGNOSIS — Z01818 Encounter for other preprocedural examination: Secondary | ICD-10-CM | POA: Insufficient documentation

## 2014-10-05 ENCOUNTER — Ambulatory Visit (INDEPENDENT_AMBULATORY_CARE_PROVIDER_SITE_OTHER): Payer: Medicare HMO | Admitting: Urology

## 2014-10-05 DIAGNOSIS — N201 Calculus of ureter: Secondary | ICD-10-CM

## 2014-10-06 ENCOUNTER — Other Ambulatory Visit: Payer: Self-pay | Admitting: Urology

## 2014-10-14 ENCOUNTER — Encounter: Payer: Self-pay | Admitting: Gastroenterology

## 2014-10-14 ENCOUNTER — Ambulatory Visit (INDEPENDENT_AMBULATORY_CARE_PROVIDER_SITE_OTHER): Payer: Medicare HMO | Admitting: Gastroenterology

## 2014-10-14 VITALS — BP 119/74 | HR 102 | Temp 98.3°F | Ht 66.0 in | Wt 192.2 lb

## 2014-10-14 DIAGNOSIS — D649 Anemia, unspecified: Secondary | ICD-10-CM | POA: Insufficient documentation

## 2014-10-14 DIAGNOSIS — D126 Benign neoplasm of colon, unspecified: Secondary | ICD-10-CM | POA: Diagnosis not present

## 2014-10-14 DIAGNOSIS — R1310 Dysphagia, unspecified: Secondary | ICD-10-CM

## 2014-10-14 DIAGNOSIS — R634 Abnormal weight loss: Secondary | ICD-10-CM

## 2014-10-14 DIAGNOSIS — K219 Gastro-esophageal reflux disease without esophagitis: Secondary | ICD-10-CM | POA: Diagnosis not present

## 2014-10-14 DIAGNOSIS — D538 Other specified nutritional anemias: Secondary | ICD-10-CM

## 2014-10-14 LAB — CBC WITH DIFFERENTIAL/PLATELET
Basophils Absolute: 0 10*3/uL (ref 0.0–0.1)
Basophils Relative: 0 % (ref 0–1)
Eosinophils Absolute: 0.2 10*3/uL (ref 0.0–0.7)
Eosinophils Relative: 2 % (ref 0–5)
HCT: 35.4 % — ABNORMAL LOW (ref 36.0–46.0)
Hemoglobin: 11.4 g/dL — ABNORMAL LOW (ref 12.0–15.0)
Lymphocytes Relative: 28 % (ref 12–46)
Lymphs Abs: 2.7 10*3/uL (ref 0.7–4.0)
MCH: 26.6 pg (ref 26.0–34.0)
MCHC: 32.2 g/dL (ref 30.0–36.0)
MCV: 82.5 fL (ref 78.0–100.0)
MPV: 9.6 fL (ref 8.6–12.4)
Monocytes Absolute: 1 10*3/uL (ref 0.1–1.0)
Monocytes Relative: 10 % (ref 3–12)
Neutro Abs: 5.7 10*3/uL (ref 1.7–7.7)
Neutrophils Relative %: 60 % (ref 43–77)
Platelets: 399 10*3/uL (ref 150–400)
RBC: 4.29 MIL/uL (ref 3.87–5.11)
RDW: 19 % — ABNORMAL HIGH (ref 11.5–15.5)
WBC: 9.5 10*3/uL (ref 4.0–10.5)

## 2014-10-14 NOTE — Progress Notes (Signed)
ON RECALL LIST  °

## 2014-10-14 NOTE — Assessment & Plan Note (Signed)
SYMPTOMS CONTROLLED/RESOLVED. STILL HAS TROUBLE WITH LIQUIDS.  CONTINUE TO MONITOR SYMPTOMS. FOLLOW UP IN 6 MOS.

## 2014-10-14 NOTE — Progress Notes (Signed)
cc'ed to pcp °

## 2014-10-14 NOTE — Assessment & Plan Note (Signed)
PERSONAL HISTORY OF POLYPS-LAST TCS APR 2013  NEXT TCS 2018

## 2014-10-14 NOTE — Progress Notes (Signed)
Subjective:    Patient ID: Cheryl Abbott, female    DOB: Dec 06, 1945, 68 y.o.   MRN: [id number]  Tula Nakayama, MD  HPI LAST SEEN JUN 2015: 209 LBS FOR FATTY LIVER. SLACKED UP ON PEPSI. STILL EATS STRAWBERRY JELLO ROLLS OCCASIONALLY. SWALLOWING STRANGLED ON LIQUIDS: <1-2X/WEEK. BMs: GOOD WITH STOOL SOFTENER. LOST 17 LBS UN-INTENTIONAL WEIGHT LOSS. C/O NAUSEA:1-2X/WEEK. HAS A KIDNEY STONE AND PENDING CYSTOSCOPY. TRYING TO DRINK MORE WATER. NO DIARRHEA LATELY. ATE BAKED POTATO AND TEA FOR BREAKFAST THIS MORNING. SLEEPING A LOT MORE. TOOK HER MEDICAID.  PT DENIES FEVER, CHILLS, HEMATOCHEZIA,  vomiting, melena, diarrhea, CHEST PAIN, SHORTNESS OF BREATH, constipation, abdominal pain, problems swallowing, OR heartburn or indigestion.   Past Medical History  Diagnosis Date  . Anxiety disorder   . Diabetes mellitus type 1   . Hyperlipidemia   . Hypertension   . Sleep apnea   . Disease of vein   . Coronary artery disease   . Complication of anesthesia     pt states she was not given enough medication with  tcs with Dr Tamala Julian                                    last [place].as able to feel and hear everything.  . Depression   . DEMENTIA   . Renal insufficiency   . Kidney stones   . DDD (degenerative disc disease), lumbar   . Sciatica   . Chronic back pain   . Chronic shoulder pain   . Headache   . ANEMIA 09/08/2009    DEC 2014 Hb 13.5     Past Surgical History  Procedure Laterality Date  . Partial hysterectomy    . Lithotryspy and stent replacement rt ureter  10/2004    12/2009 Dr, Michela Pitcher   . Removal of lump from posterior r thigh    . Cardiac catheterization  2005  . Flexible sigmoidoscopy  09/11/2011    [id number] Internal hemorrhoids  . Extraction of left kidney stone    . Bilateral stone removal with nephrostomy at [facility]    . Esophagogastroduodenoscopy (egd) with propofol N/A 03/17/2013    Procedure: ESOPHAGOGASTRODUODENOSCOPY (EGD) WITH PROPOFOL;  Surgeon: Danie Binder, MD;  Location: [facility];  Service: Endoscopy;  Laterality: N/A;  . Savory dilation N/A 03/17/2013    Procedure: SAVORY DILATION;  Surgeon: Danie Binder, MD;  Location: [facility];  Service: Endoscopy;  Laterality: N/A;  #12.8, 14, 15, 16 dilators used  . Esophageal biopsy N/A 03/17/2013    Procedure: GASTRIC BIOPSIES;  Surgeon: Danie Binder, MD;  Location: [facility];  Service: Endoscopy;  Laterality: N/A;  . Polypectomy N/A 03/17/2013    Procedure: GASTRIC POLYPECTOMY;  Surgeon: Danie Binder, MD;  Location: [facility];  Service: Endoscopy;  Laterality: N/A;  . Abdominal hysterectomy    . Colonoscopy  08/21/06    adenomatous polyps:MULTIPLE  . Colonoscopy  10/02/2011    [facility] [place] TICS, IH, NEXT TCS APR 2018   Allergies  Allergen Reactions  . Ace Inhibitors Cough  . Keflex [Cephalexin] Diarrhea and Nausea And Vomiting  . Nitrofurantoin Diarrhea and Nausea And Vomiting  . Penicillins Hives, Rash and Other (See Comments)    Blisters on hands and feet    Current Outpatient Prescriptions  Medication Sig Dispense Refill  . ALPRAZolam (XANAX) 0.5 MG tablet TAKE 1 TABLET BY MOUTH TWICE A DAY    .  amLODipine (NORVASC) 10 MG tablet Take 1 tablet (10 mg total) by mouth daily.    Marland Kitchen amLODipine (NORVASC) 5 MG tablet TAKE 1 TABLET (5 MG TOTAL) BY MOUTH DAILY.    . clotrimazole-betamethasone (LOTRISONE) cream Apply 1 application topically 2 (two) times daily. Apply to affected areas    . gabapentin (NEURONTIN) 100 MG capsule Take 1 capsule (100 mg total) by mouth at bedtime.    . lovastatin (MEVACOR) 40 MG tablet TAKE 2 TABLETS BY MOUTH AT BEDTIME    . metFORMIN (GLUCOPHAGE) 500 MG tablet TAKE 2 TABLETS (1,000 MG TOTAL) BY MOUTH 2 (TWO) TIMES DAILY WITH A MEAL.    Marland Kitchen oxyCODONE-acetaminophen (PERCOCET/ROXICET) 5-325 MG per tablet     . pantoprazole (PROTONIX) 40 MG tablet Take 1 tablet (40 mg total) by mouth 2 (two) times daily.    Marland Kitchen PARoxetine (PAXIL) 40 MG tablet Take 1 tablet (40 mg total) by mouth every  morning.    . potassium citrate (UROCIT-K) 10 MEQ (1080 MG) SR tablet TAKE 1 TABLET 3 TIMES DAILY WITH MEALS.    . traMADol (ULTRAM) 50 MG tablet TAKE 1 TABLET TWICE A DAY    . Vitamin D, Ergocalciferol, (DRISDOL) 50000 UNITS CAPS capsule TAKE 1 CAPSULE (50,000 UNITS TOTAL) BY MOUTH EVERY 7 (SEVEN) DAYS.    Marland Kitchen       Review of Systems     Objective:   Physical Exam  Constitutional: She is oriented to person, place, and time. She appears well-developed and well-nourished. No distress.  HENT:  Head: Normocephalic and atraumatic.  Mouth/Throat: Oropharynx is clear and moist. No oropharyngeal exudate.  Eyes: Pupils are equal, round, and reactive to light. No scleral icterus.  Neck: Normal range of motion. Neck supple.  Cardiovascular: Normal rate, regular rhythm and normal heart sounds.   Pulmonary/Chest: Effort normal and breath sounds normal. No respiratory distress.  Abdominal: Soft. Bowel sounds are normal. She exhibits no distension. There is no tenderness.  Musculoskeletal: She exhibits no edema.  Lymphadenopathy:    She has no cervical adenopathy.  Neurological: She is alert and oriented to person, place, and time.  NO  NEW FOCAL DEFICITS   Psychiatric:  FLAT AFFECT, NL MOOD  Vitals reviewed.         Assessment & Plan:

## 2014-10-14 NOTE — Assessment & Plan Note (Signed)
SYMPTOMS CONTROLLED/RESOLVED.  CONTINUE PROTONIX. TAKE 30 MINUTES PRIOR TO MEALS TWICE DAILY. FOLLOW LOW FAT/DIABETIC DIET FOLLOW UP IN 6 MOS.

## 2014-10-14 NOTE — Patient Instructions (Signed)
GET YOUR THYROID AND BLOOD COUNT CHECKED TODAY.   CONTINUE PROTONIX. TAKE 30 MINUTES PRIOR TO MEALS TWICE DAILY.  CONTINUE TO FOLLOW A LOW FAT/DIABETIC DIET.  FOLLOW UP IN 6 MOS.

## 2014-10-14 NOTE — Assessment & Plan Note (Signed)
Unintentional and associated with fatigue. Lost HER MEDICAID.  TSH TODAY.

## 2014-10-14 NOTE — Assessment & Plan Note (Signed)
NO BRBPR OR MELENA. LAST CBC MAR 2015. ALSO C/O FATIGUE AND SOMNOLENCE.  CBC TODAY.

## 2014-10-15 ENCOUNTER — Telehealth: Payer: Self-pay | Admitting: Gastroenterology

## 2014-10-15 LAB — TSH: TSH: 2.167 u[IU]/mL (ref 0.350–4.500)

## 2014-10-15 NOTE — Telephone Encounter (Signed)
PLEASE CALL PT. HER BLOOD COUNT IS A LITTLE LOW AT 11.4 MOST LIKELY DUE TO MILD KIDNEY DYSFUNCTION. IT IS NOT LOW ENOUGH TO CAUSE FATIGUE/DROWSINESS. HER THYROID TEST IS NORMAL. SHE NEEDS A FERRITIN TO CHECK HER IRON STORES.

## 2014-10-18 ENCOUNTER — Other Ambulatory Visit: Payer: Self-pay

## 2014-10-18 DIAGNOSIS — D649 Anemia, unspecified: Secondary | ICD-10-CM

## 2014-10-19 ENCOUNTER — Encounter (HOSPITAL_BASED_OUTPATIENT_CLINIC_OR_DEPARTMENT_OTHER): Payer: Self-pay | Admitting: *Deleted

## 2014-10-19 NOTE — Telephone Encounter (Signed)
Pt does not have a phone number listed. I have mailed a letter for her to call and also faxed the lab order to [facility].

## 2014-10-20 ENCOUNTER — Encounter (HOSPITAL_BASED_OUTPATIENT_CLINIC_OR_DEPARTMENT_OTHER): Payer: Self-pay | Admitting: *Deleted

## 2014-10-20 NOTE — Progress Notes (Addendum)
NPO AFTER MN.  ARRIVE AT 0830.  NEEDS ISTAT AND KUB.  CURRENT EKG IN CHART AND EPIC. WILL TAKE PROTONIX, XANAX, AND NORVASC AM DOS W/ SIPS  OF WATER AND IF NEEDED TAKE TRAMADOL.

## 2014-10-21 ENCOUNTER — Ambulatory Visit (HOSPITAL_BASED_OUTPATIENT_CLINIC_OR_DEPARTMENT_OTHER): Payer: Medicare HMO | Admitting: Anesthesiology

## 2014-10-21 ENCOUNTER — Encounter (HOSPITAL_BASED_OUTPATIENT_CLINIC_OR_DEPARTMENT_OTHER)
Admission: RE | Disposition: A | Discharge status: Home / Self Care | Payer: Self-pay | Source: Ambulatory Visit | Attending: Urology

## 2014-10-21 ENCOUNTER — Encounter (HOSPITAL_BASED_OUTPATIENT_CLINIC_OR_DEPARTMENT_OTHER): Payer: Self-pay

## 2014-10-21 ENCOUNTER — Ambulatory Visit (HOSPITAL_BASED_OUTPATIENT_CLINIC_OR_DEPARTMENT_OTHER)
Admission: RE | Admit: 2014-10-21 | Discharge: 2014-10-21 | Disposition: A | Discharge status: Home / Self Care | Payer: Medicare HMO | Source: Ambulatory Visit | Attending: Urology | Admitting: Urology

## 2014-10-21 ENCOUNTER — Ambulatory Visit (HOSPITAL_COMMUNITY): Payer: Medicare HMO

## 2014-10-21 DIAGNOSIS — N132 Hydronephrosis with renal and ureteral calculous obstruction: Secondary | ICD-10-CM | POA: Insufficient documentation

## 2014-10-21 DIAGNOSIS — E119 Type 2 diabetes mellitus without complications: Secondary | ICD-10-CM | POA: Diagnosis not present

## 2014-10-21 DIAGNOSIS — G4733 Obstructive sleep apnea (adult) (pediatric): Secondary | ICD-10-CM | POA: Insufficient documentation

## 2014-10-21 DIAGNOSIS — G8929 Other chronic pain: Secondary | ICD-10-CM | POA: Diagnosis not present

## 2014-10-21 DIAGNOSIS — N201 Calculus of ureter: Secondary | ICD-10-CM | POA: Diagnosis present

## 2014-10-21 DIAGNOSIS — Z87891 Personal history of nicotine dependence: Secondary | ICD-10-CM | POA: Diagnosis not present

## 2014-10-21 DIAGNOSIS — M5116 Intervertebral disc disorders with radiculopathy, lumbar region: Secondary | ICD-10-CM | POA: Insufficient documentation

## 2014-10-21 DIAGNOSIS — Z9989 Dependence on other enabling machines and devices: Secondary | ICD-10-CM | POA: Diagnosis not present

## 2014-10-21 DIAGNOSIS — M549 Dorsalgia, unspecified: Secondary | ICD-10-CM | POA: Insufficient documentation

## 2014-10-21 DIAGNOSIS — N2889 Other specified disorders of kidney and ureter: Secondary | ICD-10-CM | POA: Diagnosis not present

## 2014-10-21 DIAGNOSIS — G629 Polyneuropathy, unspecified: Secondary | ICD-10-CM | POA: Diagnosis not present

## 2014-10-21 DIAGNOSIS — M1990 Unspecified osteoarthritis, unspecified site: Secondary | ICD-10-CM | POA: Insufficient documentation

## 2014-10-21 DIAGNOSIS — K219 Gastro-esophageal reflux disease without esophagitis: Secondary | ICD-10-CM | POA: Diagnosis not present

## 2014-10-21 DIAGNOSIS — Z79899 Other long term (current) drug therapy: Secondary | ICD-10-CM | POA: Diagnosis not present

## 2014-10-21 DIAGNOSIS — I10 Essential (primary) hypertension: Secondary | ICD-10-CM | POA: Insufficient documentation

## 2014-10-21 HISTORY — DX: Intervertebral disc disorders with radiculopathy, lumbar region: M51.16

## 2014-10-21 HISTORY — DX: Obstructive sleep apnea (adult) (pediatric): G47.33

## 2014-10-21 HISTORY — DX: Type 2 diabetes mellitus without complications: E11.9

## 2014-10-21 HISTORY — DX: Polyneuropathy, unspecified: G62.9

## 2014-10-21 HISTORY — DX: Gastro-esophageal reflux disease without esophagitis: K21.9

## 2014-10-21 HISTORY — DX: Fatty (change of) liver, not elsewhere classified: K76.0

## 2014-10-21 HISTORY — DX: Personal history of urinary calculi: Z87.442

## 2014-10-21 HISTORY — DX: Unspecified osteoarthritis, unspecified site: M19.90

## 2014-10-21 HISTORY — DX: Calculus of ureter: N20.1

## 2014-10-21 HISTORY — DX: Anemia, unspecified: D64.9

## 2014-10-21 HISTORY — DX: Hypercalcemia: E83.52

## 2014-10-21 HISTORY — DX: Calculus of kidney: N20.0

## 2014-10-21 HISTORY — PX: HOLMIUM LASER APPLICATION: SHX5852

## 2014-10-21 HISTORY — DX: Personal history of adenomatous and serrated colon polyps: Z86.0101

## 2014-10-21 HISTORY — PX: CYSTOSCOPY WITH RETROGRADE PYELOGRAM, URETEROSCOPY AND STENT PLACEMENT: SHX5789

## 2014-10-21 HISTORY — DX: Diverticulosis of large intestine without perforation or abscess without bleeding: K57.30

## 2014-10-21 HISTORY — DX: Personal history of colonic polyps: Z86.010

## 2014-10-21 LAB — POCT I-STAT 4, (NA,K, GLUC, HGB,HCT)
Glucose, Bld: 96 mg/dL (ref 65–99)
HCT: 30 % — ABNORMAL LOW (ref 36.0–46.0)
Hemoglobin: 10.2 g/dL — ABNORMAL LOW (ref 12.0–15.0)
Potassium: 4.4 mmol/L (ref 3.5–5.1)
Sodium: 139 mmol/L (ref 135–145)

## 2014-10-21 LAB — GLUCOSE, CAPILLARY: Glucose-Capillary: 92 mg/dL (ref 65–99)

## 2014-10-21 SURGERY — CYSTOURETEROSCOPY, WITH RETROGRADE PYELOGRAM AND STENT INSERTION
Anesthesia: General | Site: Ureter | Laterality: Left

## 2014-10-21 MED ORDER — ONDANSETRON HCL 4 MG/2ML IJ SOLN
INTRAMUSCULAR | Status: DC | PRN
  Administered 2014-10-21: 4 mg via INTRAVENOUS

## 2014-10-21 MED ORDER — FENTANYL CITRATE (PF) 100 MCG/2ML IJ SOLN
INTRAMUSCULAR | Status: DC | PRN
  Administered 2014-10-21 (×2): 50 ug via INTRAVENOUS

## 2014-10-21 MED ORDER — FENTANYL CITRATE (PF) 100 MCG/2ML IJ SOLN
50.0000 ug | INTRAMUSCULAR | Status: DC | PRN
  Filled 2014-10-21: qty 1

## 2014-10-21 MED ORDER — IOHEXOL 350 MG/ML SOLN
INTRAVENOUS | Status: DC | PRN
  Administered 2014-10-21: 14 mL

## 2014-10-21 MED ORDER — CIPROFLOXACIN HCL 250 MG PO TABS: 250.0000 mg | ORAL_TABLET | Freq: Two times a day (BID) | ORAL | Status: DC

## 2014-10-21 MED ORDER — FENTANYL CITRATE (PF) 100 MCG/2ML IJ SOLN
INTRAMUSCULAR | Status: AC
  Filled 2014-10-21: qty 4

## 2014-10-21 MED ORDER — MEPERIDINE HCL 25 MG/ML IJ SOLN
6.2500 mg | INTRAMUSCULAR | Status: DC | PRN
  Filled 2014-10-21: qty 1

## 2014-10-21 MED ORDER — OXYBUTYNIN CHLORIDE 5 MG PO TABS: 5.0000 mg | ORAL_TABLET | Freq: Three times a day (TID) | ORAL | Status: DC | PRN

## 2014-10-21 MED ORDER — PROMETHAZINE HCL 25 MG/ML IJ SOLN
6.2500 mg | INTRAMUSCULAR | Status: DC | PRN
  Filled 2014-10-21: qty 1

## 2014-10-21 MED ORDER — PROPOFOL 10 MG/ML IV BOLUS
INTRAVENOUS | Status: DC | PRN
  Administered 2014-10-21: 150 mg via INTRAVENOUS

## 2014-10-21 MED ORDER — LIDOCAINE HCL (CARDIAC) 20 MG/ML IV SOLN
INTRAVENOUS | Status: DC | PRN
  Administered 2014-10-21: 60 mg via INTRAVENOUS

## 2014-10-21 MED ORDER — CIPROFLOXACIN IN D5W 400 MG/200ML IV SOLN
INTRAVENOUS | Status: AC
  Filled 2014-10-21: qty 200

## 2014-10-21 MED ORDER — LACTATED RINGERS IV SOLN
INTRAVENOUS | Status: DC
  Administered 2014-10-21: 09:00:00 via INTRAVENOUS
  Filled 2014-10-21: qty 1000

## 2014-10-21 MED ORDER — STERILE WATER FOR IRRIGATION IR SOLN
Status: DC | PRN
Start: 2014-10-21 — End: 2014-10-21
  Administered 2014-10-21: 500 mL

## 2014-10-21 MED ORDER — CIPROFLOXACIN IN D5W 400 MG/200ML IV SOLN
400.0000 mg | INTRAVENOUS | Status: AC
  Administered 2014-10-21: 400 mg via INTRAVENOUS
  Filled 2014-10-21: qty 200

## 2014-10-21 MED ORDER — SODIUM CHLORIDE 0.9 % IR SOLN
Status: DC | PRN
  Administered 2014-10-21: 1000 mL
  Administered 2014-10-21: 3000 mL

## 2014-10-21 SURGICAL SUPPLY — 22 items
BAG DRAIN URO-CYSTO SKYTR STRL (DRAIN) ×3 IMPLANT
BAG DRN UROCATH (DRAIN) ×2
CANISTER SUCT LVC 12 LTR MEDI- (MISCELLANEOUS) ×1 IMPLANT
CATH INTERMIT  6FR 70CM (CATHETERS) ×1 IMPLANT
CLOTH BEACON ORANGE TIMEOUT ST (SAFETY) ×3 IMPLANT
FIBER LASER TRAC TIP (UROLOGICAL SUPPLIES) ×1 IMPLANT
GLOVE BIO SURGEON STRL SZ8 (GLOVE) ×3 IMPLANT
GLOVE BIOGEL PI IND STRL 7.0 (GLOVE) IMPLANT
GLOVE BIOGEL PI INDICATOR 7.0 (GLOVE) ×1
GLOVE ECLIPSE 6.5 STRL STRAW (GLOVE) ×1 IMPLANT
GOWN STRL REUS W/ TWL LRG LVL3 (GOWN DISPOSABLE) ×2 IMPLANT
GOWN STRL REUS W/ TWL XL LVL3 (GOWN DISPOSABLE) ×2 IMPLANT
GOWN STRL REUS W/TWL LRG LVL3 (GOWN DISPOSABLE) ×3
GOWN STRL REUS W/TWL XL LVL3 (GOWN DISPOSABLE) ×3
GUIDEWIRE STR DUAL SENSOR (WIRE) ×1 IMPLANT
IV NS 1000ML (IV SOLUTION) ×3
IV NS 1000ML BAXH (IV SOLUTION) IMPLANT
IV NS IRRIG 3000ML ARTHROMATIC (IV SOLUTION) ×3 IMPLANT
LASER FIBER DISP (UROLOGICAL SUPPLIES) IMPLANT
PACK CYSTO (CUSTOM PROCEDURE TRAY) ×3 IMPLANT
STENT URET 6FRX24 CONTOUR (STENTS) ×1 IMPLANT
WATER STERILE IRR 500ML POUR (IV SOLUTION) ×1 IMPLANT

## 2014-10-21 NOTE — Anesthesia Postprocedure Evaluation (Signed)
  Anesthesia Post-op Note  Patient: Cheryl Abbott  Procedure(s) Performed: Procedure(s) (LRB): CYSTOSCOPY WITH RETROGRADE PYELOGRAM, URETEROSCOPY AND STENT PLACEMENT (Left) HOLMIUM LASER APPLICATION (Left)  Patient Location: PACU  Anesthesia Type: General  Level of Consciousness: awake and alert   Airway and Oxygen Therapy: Patient Spontanous Breathing  Post-op Pain: mild  Post-op Assessment: Post-op Vital signs reviewed, Patient's Cardiovascular Status Stable, Respiratory Function Stable, Patent Airway and No signs of Nausea or vomiting  Last Vitals:  Filed Vitals:   10/21/14 1207  BP:   Pulse: 79  Temp:   Resp: 14    Post-op Vital Signs: stable   Complications: No apparent anesthesia complications

## 2014-10-21 NOTE — Progress Notes (Signed)
Ok to discharge per Dr.Caringan due to her sao2 varies 88-99

## 2014-10-21 NOTE — Op Note (Signed)
PATIENT:  Cheryl Abbott  PRE-OPERATIVE DIAGNOSIS: Left ureteral stone  POST-OPERATIVE DIAGNOSIS: Same  PROCEDURE: Cystoscopy, left retrograde ureteropyelogram with interpretive fluoroscopy, right retrograde pyelogram with interpretive fluoroscopy, left ureteroscopy with holmium laser and basketing/extraction of left ureteral stone, double-J stent placement on left (6 [place] by 24 cm without string)  SURGEON:  Lillette Boxer. Marcelia Petersen, M.D.  ANESTHESIA:  General  EBL:  None  DRAINS: 6 [place] by 24 cm contour double-J stent without string  LOCAL MEDICATIONS USED:  None  SPECIMEN:  Small stone fragments, to patient   INDICATION: MARKEDA NIMMER is a 69 year old female with a symptomatic left proximal ureteral stone approximately 8 mm in size. She has associated hydronephrosis. Because of non-passage of the stone, it was recommended that she undergo either lithotripsy or ureteroscopy with holmium laser and extraction of her stone. Because of the long period of time where the stone is most likely been there, I have suggested ureteroscopy, as there may be some ureteral narrowing from impaction. She accepts this procedure. She is aware of the risks and complications and desires to proceed.  Description of procedure: The patient was properly identified and marked (if applicable) in the holding area. They were then  taken to the operating room and placed on the table in a supine position. General anesthesia was then administered. Once fully anesthetized the patient was moved to the dorsolithotomy position and the genitalia and perineum were sterilely prepped and draped in standard fashion. An official timeout was then performed.   A 23 French panendoscope was placed into the bladder. Careful inspection was performed. The bladder was normal. No urothelial lesions were noted. Both ureteral orifices were normal in location and configuration. The left ureter was cannulated with a 6 [place] open-ended  catheter. Retrograde ureteropyelogram was performed. This revealed a normal ureter up to approximately the level of the L3 transverse process. There was a significant filling defect and minimal passage of contrast around this. That was consistent with approximately ureteral stone that was previously noted radiographically. I then passed a 0.038 inch sensor-tip guidewire through the open-ended catheter. It was difficult, but I eventually navigated the guidewire past the stone which seemingly was impacted. Once a good curl was seen in the upper pole calyces, I then passed the 6 [place] open-ended catheter over top of the guidewire, passed the stone. At this point, retrograde filling of the hila calyceal system was performed pre-this revealed significant pelvocaliectasis. I did not see any filling defects in the pyelo-calyceal system. This also verified adequate positioning of the guidewire. The guidewire was replaced, and the open-ended catheter removed. I then removed the cystoscope. I dilated the distal ureter with a 12/14 ureteral access sheath. First, the inner core was placed, then the entire system. At this point, the guidewire was left in and the dilator removed. I then passed a 6 French rigid ureteroscope through the urethra, up into the left ureteral orifice and up under direct vision through the ureter to the stone. This was crystalline/dihydrate in appearance. The 200  fiber was used to pass through the scope up to the stone. The stone was fragmented using a power of 0.2 J at 10 Hz frequency. This fragmented the stone into multiple tiny fragments. 1-2 larger fragments were basketed with the Nitinol basket. The other short so small that they were could not be engaged with the basket. I did use the basket while open to sweep the stone fragments more distally in the ureter and into the bladder. This  was easily performed. I inspected the site of the prior stone, and looked at the more proximal ureter. No stone  fragments were seen at this point. There was significant inflammation of the ureter where the stone had been impacted. Because of this, I did not feel temporary stenting would be appropriate-I felt that I needed to leave the stent in at least 2-3 weeks to allow this area to heal. Thus, a 24 cm x 6 French contour stent was obtained, and the string removed. After I verified passage of all fragments within the ureter and of the bladder, the ureteroscope was removed. Over top of the safety guidewire I then placed, using a cystoscope, the previously mentioned double-J stent. Adequate rostral and distal curls in the stent were seen using fluoroscopic and cystoscopic guidance after removal of the guidewire. The bladder was then drained. The scope was removed.  The patient tolerated procedure well. She was awakened and taken to the PACU in stable condition.

## 2014-10-21 NOTE — Discharge Instructions (Signed)
POSTOPERATIVE CARE AFTER URETEROSCOPY  Stent management  *Stents are often left in after ureteroscopy and stone treatment. If left in, they often cause urinary frequency, urgency, occasional blood in the urine, as well as flank discomfort with urination. These are all expected issues, and should resolve after the stent is removed.   Diet  Once you have adequately recovered from anesthesia, you may gradually advance your diet, as tolerated, to your regular diet.  Activities  You may gradually increase your activities to your normal unrestricted level the day following your procedure.  Medications  You should resume all preoperative medications. If you are on aspirin-like compounds, you should not resume these until the blood clears from your urine. If given an antibiotic by the surgeon, take these until they are completed. You may also be given, if you have a stent, medications to decrease the urinary frequency and urgency.  Pain  After ureteroscopy, there may be some pain on the side of the scope. Take your pain medicine for this. Usually, this pain resolves within a day or 2.  Fever  Please report any fever over 100 to the doctor.   Post Anesthesia Home Care Instructions  Activity: Get plenty of rest for the remainder of the day. A responsible adult should stay with you for 24 hours following the procedure.  For the next 24 hours, DO NOT: -Drive a car -Paediatric nurse -Drink alcoholic beverages -Take any medication unless instructed by your physician -Make any legal decisions or sign important papers.  Meals: Start with liquid foods such as gelatin or soup. Progress to regular foods as tolerated. Avoid greasy, spicy, heavy foods. If nausea and/or vomiting occur, drink only clear liquids until the nausea and/or vomiting subsides. Call your physician if vomiting continues.  Special Instructions/Symptoms: Your throat may feel dry or sore from the anesthesia or the  breathing tube placed in your throat during surgery. If this causes discomfort, gargle with warm salt water. The discomfort should disappear within 24 hours.  If you had a scopolamine patch placed behind your ear for the management of post- operative nausea and/or vomiting:  1. The medication in the patch is effective for 72 hours, after which it should be removed.  Wrap patch in a tissue and discard in the trash. Wash hands thoroughly with soap and water. 2. You may remove the patch earlier than 72 hours if you experience unpleasant side effects which may include dry mouth, dizziness or visual disturbances. 3. Avoid touching the patch. Wash your hands with soap and water after contact with the patch.

## 2014-10-21 NOTE — Anesthesia Procedure Notes (Signed)
Procedure Name: LMA Insertion Performed by: Mechele Claude Pre-anesthesia Checklist: Patient identified, Emergency Drugs available, Suction available and Patient being monitored Patient Re-evaluated:Patient Re-evaluated prior to inductionOxygen Delivery Method: Circle System Utilized Preoxygenation: Pre-oxygenation with 100% oxygen Intubation Type: IV induction Ventilation: Mask ventilation without difficulty LMA: LMA inserted LMA Size: 4.0 Number of attempts: 1 Airway Equipment and Method: Bite block Placement Confirmation: positive ETCO2 Tube secured with: Tape Dental Injury: Teeth and Oropharynx as per pre-operative assessment

## 2014-10-21 NOTE — Transfer of Care (Signed)
Immediate Anesthesia Transfer of Care Note  Patient: Cheryl Abbott  Procedure(s) Performed: Procedure(s): CYSTOSCOPY WITH RETROGRADE PYELOGRAM, URETEROSCOPY AND STENT PLACEMENT (Left) HOLMIUM LASER APPLICATION (Left)  Patient Location: PACU  Anesthesia Type:General  Level of Consciousness: sedated and responds to stimulation  Airway & Oxygen Therapy: Patient Spontanous Breathing and Patient connected to face mask oxygen  Post-op Assessment: Report given to RN  Post vital signs: Reviewed and stable  Last Vitals:  Filed Vitals:   10/21/14 0835  BP: 119/77  Pulse: 84  Temp: 37 C  Resp: 12    Complications: No apparent anesthesia complications

## 2014-10-21 NOTE — Anesthesia Preprocedure Evaluation (Signed)
Anesthesia Evaluation  Patient identified by MRN, date of birth, ID band Patient awake    Reviewed: Allergy & Precautions, NPO status , Patient's Chart, lab work & pertinent test results  Airway Mallampati: II  TM Distance: >3 FB Neck ROM: Full    Dental no notable dental hx.    Pulmonary sleep apnea and Continuous Positive Airway Pressure Ventilation , former smoker,  breath sounds clear to auscultation  Pulmonary exam normal       Cardiovascular hypertension, Pt. on medications Normal cardiovascular examRhythm:Regular Rate:Normal     Neuro/Psych negative neurological ROS  negative psych ROS   GI/Hepatic Neg liver ROS, GERD-  Medicated and Controlled,  Endo/Other  negative endocrine ROSdiabetes, Type 2, Oral Hypoglycemic Agents  Renal/GU negative Renal ROS  negative genitourinary   Musculoskeletal negative musculoskeletal ROS (+)   Abdominal   Peds negative pediatric ROS (+)  Hematology negative hematology ROS (+)   Anesthesia Other Findings   Reproductive/Obstetrics negative OB ROS                             Anesthesia Physical Anesthesia Plan  ASA: II  Anesthesia Plan: General   Post-op Pain Management:    Induction: Intravenous  Airway Management Planned: LMA  Additional Equipment:   Intra-op Plan:   Post-operative Plan:   Informed Consent: I have reviewed the patients History and Physical, chart, labs and discussed the procedure including the risks, benefits and alternatives for the proposed anesthesia with the patient or authorized representative who has indicated his/her understanding and acceptance.   Dental advisory given  Plan Discussed with: CRNA  Anesthesia Plan Comments:         Anesthesia Quick Evaluation

## 2014-10-21 NOTE — H&P (Signed)
H&P  Chief Complaint: Left sided kidney stone  History of Present Illness: Cheryl Abbott is a 69 y.o. year old female who presents today for endoscopic management of a symptomatic left ureteral stone. She has had back and intermitent left flank pain.  Past Medical History  Diagnosis Date  . Anxiety disorder   . Hyperlipidemia   . Hypertension   . Complication of anesthesia     pt states she was not given enough medication with  tcs with Dr Tamala Julian                                    last [place].as able to feel and hear everything.  . Depression   . Sciatica   . Chronic back pain   . Headache   . Type 2 diabetes mellitus   . GERD (gastroesophageal reflux disease)   . Anemia   . History of adenomatous polyp of colon     2008  . Fatty liver   . Sigmoid diverticulosis   . Left ureteral stone   . Nephrolithiasis   . History of kidney stones   . Lumbar disc disease with radiculopathy   . Arthritis   . Neuropathy, peripheral   . Hypercalcemia   . OSA treated with BiPAP     per study 2007    Past Surgical History  Procedure Laterality Date  . Flexible sigmoidoscopy  09/11/2011    [id number] Internal hemorrhoids  . Esophagogastroduodenoscopy (egd) with propofol N/A 03/17/2013    Procedure: ESOPHAGOGASTRODUODENOSCOPY (EGD) WITH PROPOFOL;  Surgeon: Danie Binder, MD;  Location: [facility];  Service: Endoscopy;  Laterality: N/A;  . Savory dilation N/A 03/17/2013    Procedure: SAVORY DILATION;  Surgeon: Danie Binder, MD;  Location: [facility];  Service: Endoscopy;  Laterality: N/A;  #12.8, 14, 15, 16 dilators used  . Esophageal biopsy N/A 03/17/2013    Procedure: GASTRIC BIOPSIES;  Surgeon: Danie Binder, MD;  Location: [facility];  Service: Endoscopy;  Laterality: N/A;  . Polypectomy N/A 03/17/2013    Procedure: GASTRIC POLYPECTOMY;  Surgeon: Danie Binder, MD;  Location: [facility];  Service: Endoscopy;  Laterality: N/A;  . Colonoscopy  last one 10/02/2011    MOD [place] TICS, IH, NEXT TCS APR 2018   . Cysto/   right ureterosocopy laser lithotripsy stone extraction  10-13-2004  . Cysto/  right retrograde pyelogram/  placment right ureteral stent  01-10-2010  . Percutaneous nephrostolithotomy Bilateral 12/  2011     [facility]  . Cardiovascular stress test  01-01-2014    normal lexiscan cardiolite/  no ischemia/ infarct/  normal LV function and wall motion ,  ef 81%  . Transthoracic echocardiogram  01-01-2014    mild LVH/  ef [id number]  grade I diastolic dysfunction/  trivial MR, TR, and PR  . Cardiac catheterization  05-11-2003  dr Shelva Majestic ([facility])    Abnormal cardiolite/   Normal coronary arteries and normal LVF,  ef  63%  . Removal right thigh cyst  2006  . Vaginal hysterectomy  1970's    Home Medications:  No prescriptions prior to admission    Allergies:  Allergies  Allergen Reactions  . Ace Inhibitors Cough  . Keflex [Cephalexin] Diarrhea and Nausea And Vomiting  . Nitrofurantoin Diarrhea and Nausea And Vomiting  . Penicillins Hives, Rash and Other (See Comments)    Blisters on hands and feet  Family History  Problem Relation Age of Onset  . Hypertension Mother   . Diabetes Mother   . Heart failure Mother   . Dementia Mother   . Emphysema Father   . Hypertension Father   . Diabetes Brother   . GER disease Brother   . Cancer      family history   . Diabetes      family history   . Heart defect      famiily history   . Arthritis      family history   . Anesthesia problems Neg Hx   . Hypotension Neg Hx   . Malignant hyperthermia Neg Hx   . Pseudochol deficiency Neg Hx   . Colon cancer Neg Hx     Social History:  reports that she quit smoking about 20 years ago. Her smoking use included Cigarettes. She has a 20 pack-year smoking history. She has never used smokeless tobacco. She reports that she does not drink alcohol or use illicit drugs.  ROS: A complete review of systems was performed.  All systems are negative except for pertinent  findings as noted.  Physical Exam:  Vital signs in last 24 hours:   General:  Alert and oriented, No acute distress HEENT: Normocephalic, atraumatic Neck: No JVD or lymphadenopathy Cardiovascular: Regular rate and rhythm Lungs: Clear bilaterally Abdomen: Soft, nontender, nondistended, no abdominal masses Back: No CVA tenderness Extremities: No edema Neurologic: Grossly intact  Laboratory Data:  No results found for this or any previous visit (from the past 24 hour(s)). No results found for this or any previous visit (from the past 240 hour(s)). Creatinine: No results for input(s): CREATININE in the last 168 hours.  Radiologic Imaging: No results found.  Impression/Assessment:  Symptomatic left proximal ureteral stone  Plan:  Cystoscopy, left retrograde, left ureteroscopy with holmium laser and extraction of left ureteral stone.  Jorja Loa 10/21/2014, 6:20 AM  Lillette Boxer. Keyoni Lapinski MD

## 2014-10-22 ENCOUNTER — Encounter (HOSPITAL_BASED_OUTPATIENT_CLINIC_OR_DEPARTMENT_OTHER): Payer: Self-pay | Admitting: Urology

## 2014-10-23 ENCOUNTER — Other Ambulatory Visit: Payer: Self-pay | Admitting: Family Medicine

## 2014-10-24 NOTE — Assessment & Plan Note (Signed)
DASH diet and commitment to daily physical activity for a minimum of 30 minutes discussed and encouraged, as a part of hypertension management. The importance of attaining a healthy weight is also discussed. Controlled, no change in medication  BP/Weight 10/21/2014 10/14/2014 09/09/2014 08/04/2014 07/29/2014 07/27/2014 123456  Systolic BP XX123456 123456 Q000111Q 123456 123456 [id number]  Diastolic BP 87 74 82 66 73 81 72  Wt. (Lbs) 191.5 192.2 190 196.04 195 195 198.2  BMI 30.92 31.04 30.68 31.66 31.49 31.49 32.01

## 2014-10-24 NOTE — Assessment & Plan Note (Signed)
Deteriorated. Patient re-educated about  the importance of commitment to a  minimum of 150 minutes of exercise per week.  The importance of healthy food choices with portion control discussed. Encouraged to start a food diary, count calories and to consider  joining a support group. Sample diet sheets offered. Goals set by the patient for the next several months.   Weight /BMI 10/21/2014 10/14/2014 09/09/2014  WEIGHT 191 lb 8 oz 192 lb 3.2 oz 190 lb  HEIGHT 5\' 6"  5\' 6"  5\' 6"   BMI 30.92 kg/m2 31.04 kg/m2 30.68 kg/m2    Current exercise per week 120 minutes.

## 2014-10-24 NOTE — Assessment & Plan Note (Signed)
Controlled, no change in medication  

## 2014-10-24 NOTE — Assessment & Plan Note (Addendum)
Uncontrolled, increased difficulty with swallowing , will refer to GI for management

## 2014-10-24 NOTE — Assessment & Plan Note (Signed)
Patient educated about the importance of limiting  Carbohydrate intake , the need to commit to daily physical activity for a minimum of 30 minutes , and to commit weight loss. The fact that changes in all these areas will reduce or eliminate all together the development of diabetes is stressed.  Controlled , though less well Diabetic Labs Latest Ref Rng 09/08/2014 05/10/2014 12/21/2013 11/26/2013 10/06/2013  HbA1c <5.7 % 7.1(H) 6.9(H) 6.5(H) - -  Microalbumin 0.00 - 1.89 mg/dL - - - - 4.17(H)  Micro/Creat Ratio 0.0 - 30.0 mg/g - - - - 34.3(H)  Chol 0 - 200 mg/dL - 138 - - -  HDL >39 mg/dL - 42 - - -  Calc LDL 0 - 99 mg/dL - 67 - - -  Triglycerides <150 mg/dL - 143 - - -  Creatinine 0.50 - 1.10 mg/dL 1.11(H) 1.00 - 1.15(H) -   BP/Weight 10/21/2014 10/14/2014 09/09/2014 08/04/2014 07/29/2014 07/27/2014 123456  Systolic BP XX123456 123456 Q000111Q 123456 123456 [id number]  Diastolic BP 87 74 82 66 73 81 72  Wt. (Lbs) 191.5 192.2 190 196.04 195 195 198.2  BMI 30.92 31.04 30.68 31.66 31.49 31.49 32.01   Foot/eye exam completion dates Latest Ref Rng 04/20/2014 09/29/2013  Eye Exam No Retinopathy No Retinopathy -  Foot exam Order - - -  Foot Form Completion - - Done

## 2014-10-24 NOTE — Progress Notes (Signed)
Subjective:    Patient ID: Cheryl Abbott, female    DOB: 08/06/1945, 69 y.o.   MRN: [id number]  HPI The PT is here for follow up and re-evaluation of chronic medical conditions, medication management and review of any available recent lab and radiology data.  Preventive health is updated, specifically  Cancer screening and Immunization.   Questions or concerns regarding consultations or procedures which the PT has had in the interim are  addressed. The PT denies any adverse reactions to current medications since the last visit.  There are no new concerns.  There are no specific complaints       Review of Systems See HPI Denies recent fever or chills. Denies sinus pressure, nasal congestion, ear pain or sore throat. Denies chest congestion, productive cough or wheezing. Denies chest pains, palpitations and leg swelling Denies abdominal pain, nausea, vomiting,diarrhea or constipation.   Denies dysuria, frequency, hesitancy or incontinence. Denies joint pain, swelling and limitation in mobility. Denies headaches, seizures, numbness, or tingling. Denies depression, anxiety or insomnia. Denies skin break down or rash.        Objective:   Physical Exam   BP 134/82 mmHg  Pulse 92  Resp 16  Ht 5\' 6"  (1.676 m)  Wt 190 lb (86.183 kg)  BMI 30.68 kg/m2  SpO2 93% Patient alert and oriented and in no cardiopulmonary distress.  HEENT: No facial asymmetry, EOMI,   oropharynx pink and moist.  Neck supple no JVD, no mass.  Chest: Clear to auscultation bilaterally.  CVS: S1, S2 no murmurs, no S3.Regular rate.  ABD: Soft non tender.   Ext: No edema  MS: Adequate ROM spine, shoulders, hips and knees.  Skin: Intact, no ulcerations or rash noted.  Psych: Good eye contact, normal affect. Memory intact not anxious or depressed appearing.  CNS: CN 2-12 intact, power,  normal throughout.no focal deficits noted.      Assessment & Plan:  Hypertension goal BP (blood  pressure) < 130/80 DASH diet and commitment to daily physical activity for a minimum of 30 minutes discussed and encouraged, as a part of hypertension management. The importance of attaining a healthy weight is also discussed. Controlled, no change in medication  BP/Weight 10/21/2014 10/14/2014 09/09/2014 08/04/2014 07/29/2014 07/27/2014 123456  Systolic BP XX123456 123456 Q000111Q 123456 123456 [id number]  Diastolic BP 87 74 82 66 73 81 72  Wt. (Lbs) 191.5 192.2 190 196.04 195 195 198.2  BMI 30.92 31.04 30.68 31.66 31.49 31.49 32.01         Hyperlipidemia LDL goal <100 Hyperlipidemia:Low fat diet discussed and encouraged. Updated lab needed at/ before next visit.    Lipid Panel  Lab Results  Component Value Date   CHOL 138 05/10/2014   HDL 42 05/10/2014   LDLCALC 67 05/10/2014   TRIG 143 05/10/2014   CHOLHDL 3.3 05/10/2014         Generalized anxiety disorder Controlled, no change in medication    Depression Controlled, no change in medication    Anxiety state Controlled, no change in medication    Obesity (BMI 30.0-34.9) Deteriorated. Patient re-educated about  the importance of commitment to a  minimum of 150 minutes of exercise per week.  The importance of healthy food choices with portion control discussed. Encouraged to start a food diary, count calories and to consider  joining a support group. Sample diet sheets offered. Goals set by the patient for the next several months.   Weight /BMI 10/21/2014 10/14/2014 09/09/2014  WEIGHT 191  lb 8 oz 192 lb 3.2 oz 190 lb  HEIGHT 5\' 6"  5\' 6"  5\' 6"   BMI 30.92 kg/m2 31.04 kg/m2 30.68 kg/m2    Current exercise per week 120 minutes.    Type 2 diabetes with nephropathy Patient educated about the importance of limiting  Carbohydrate intake , the need to commit to daily physical activity for a minimum of 30 minutes , and to commit weight loss. The fact that changes in all these areas will reduce or eliminate all together the  development of diabetes is stressed.  Controlled , though less well Diabetic Labs Latest Ref Rng 09/08/2014 05/10/2014 12/21/2013 11/26/2013 10/06/2013  HbA1c <5.7 % 7.1(H) 6.9(H) 6.5(H) - -  Microalbumin 0.00 - 1.89 mg/dL - - - - 4.17(H)  Micro/Creat Ratio 0.0 - 30.0 mg/g - - - - 34.3(H)  Chol 0 - 200 mg/dL - 138 - - -  HDL >39 mg/dL - 42 - - -  Calc LDL 0 - 99 mg/dL - 67 - - -  Triglycerides <150 mg/dL - 143 - - -  Creatinine 0.50 - 1.10 mg/dL 1.11(H) 1.00 - 1.15(H) -   BP/Weight 10/21/2014 10/14/2014 09/09/2014 08/04/2014 07/29/2014 07/27/2014 123456  Systolic BP XX123456 123456 Q000111Q 123456 123456 [id number]  Diastolic BP 87 74 82 66 73 81 72  Wt. (Lbs) 191.5 192.2 190 196.04 195 195 198.2  BMI 30.92 31.04 30.68 31.66 31.49 31.49 32.01   Foot/eye exam completion dates Latest Ref Rng 04/20/2014 09/29/2013  Eye Exam No Retinopathy No Retinopathy -  Foot exam Order - - -  Foot Form Completion - - Done        GERD Uncontrolled, increased difficulty with swallowing , will refer to GI for management   Lumbar back pain with radiculopathy affecting left lower extremity Unchanged chronic pain mangement

## 2014-10-24 NOTE — Assessment & Plan Note (Signed)
Hyperlipidemia:Low fat diet discussed and encouraged. Updated lab needed at/ before next visit.    Lipid Panel  Lab Results  Component Value Date   CHOL 138 05/10/2014   HDL 42 05/10/2014   LDLCALC 67 05/10/2014   TRIG 143 05/10/2014   CHOLHDL 3.3 05/10/2014

## 2014-10-24 NOTE — Assessment & Plan Note (Signed)
Unchanged chronic pain mangement

## 2014-10-27 ENCOUNTER — Telehealth: Payer: Self-pay | Admitting: *Deleted

## 2014-10-27 DIAGNOSIS — N3001 Acute cystitis with hematuria: Secondary | ICD-10-CM

## 2014-10-27 NOTE — Telephone Encounter (Signed)
Pt called requesting cipro for her UTI. Please advise

## 2014-10-28 NOTE — Telephone Encounter (Signed)
Patient having pressure and dysuria and states she was given 3 days of cipro after her procedure by Dr Durward Mallard (she had a left ureteral calculus) but still having symptoms. I asked her if she had contacted him and she said he wasn't in [place] until next Tuesday and she wanted her urine checked here because she thought she had a UTI. I told her to plan to come in this am for a nurse visit but I wanted to let you know what was going on. Please advise

## 2014-10-28 NOTE — Addendum Note (Signed)
Addended by: Eual Fines on: 10/28/2014 08:27 AM   Modules accepted: Orders

## 2014-10-28 NOTE — Telephone Encounter (Signed)
Continue as stated, I recommend sending for c/s only, I do not plan to prescribe antibiotic without that, and she ahs multiple allergies listed also I think

## 2014-11-04 ENCOUNTER — Other Ambulatory Visit: Payer: Self-pay | Admitting: Urology

## 2014-11-04 ENCOUNTER — Ambulatory Visit (HOSPITAL_COMMUNITY)
Admission: RE | Admit: 2014-11-04 | Discharge: 2014-11-04 | Disposition: A | Discharge status: Home / Self Care | Payer: Medicare HMO | Source: Ambulatory Visit | Attending: Family Medicine | Admitting: Family Medicine

## 2014-11-04 ENCOUNTER — Other Ambulatory Visit: Payer: Self-pay | Admitting: Family Medicine

## 2014-11-04 DIAGNOSIS — R112 Nausea with vomiting, unspecified: Secondary | ICD-10-CM | POA: Insufficient documentation

## 2014-11-04 DIAGNOSIS — N200 Calculus of kidney: Secondary | ICD-10-CM | POA: Insufficient documentation

## 2014-11-06 IMAGING — CR DG SACRUM/COCCYX 2+V
3 series · 3 of 3 positions shown · non-contrast
Comparison: None.

CLINICAL DATA: Fall.  Sacrococcygeal pain.

SACRUM AND COCCYX - 2+ VIEW

[view not recorded (1 of 3)]
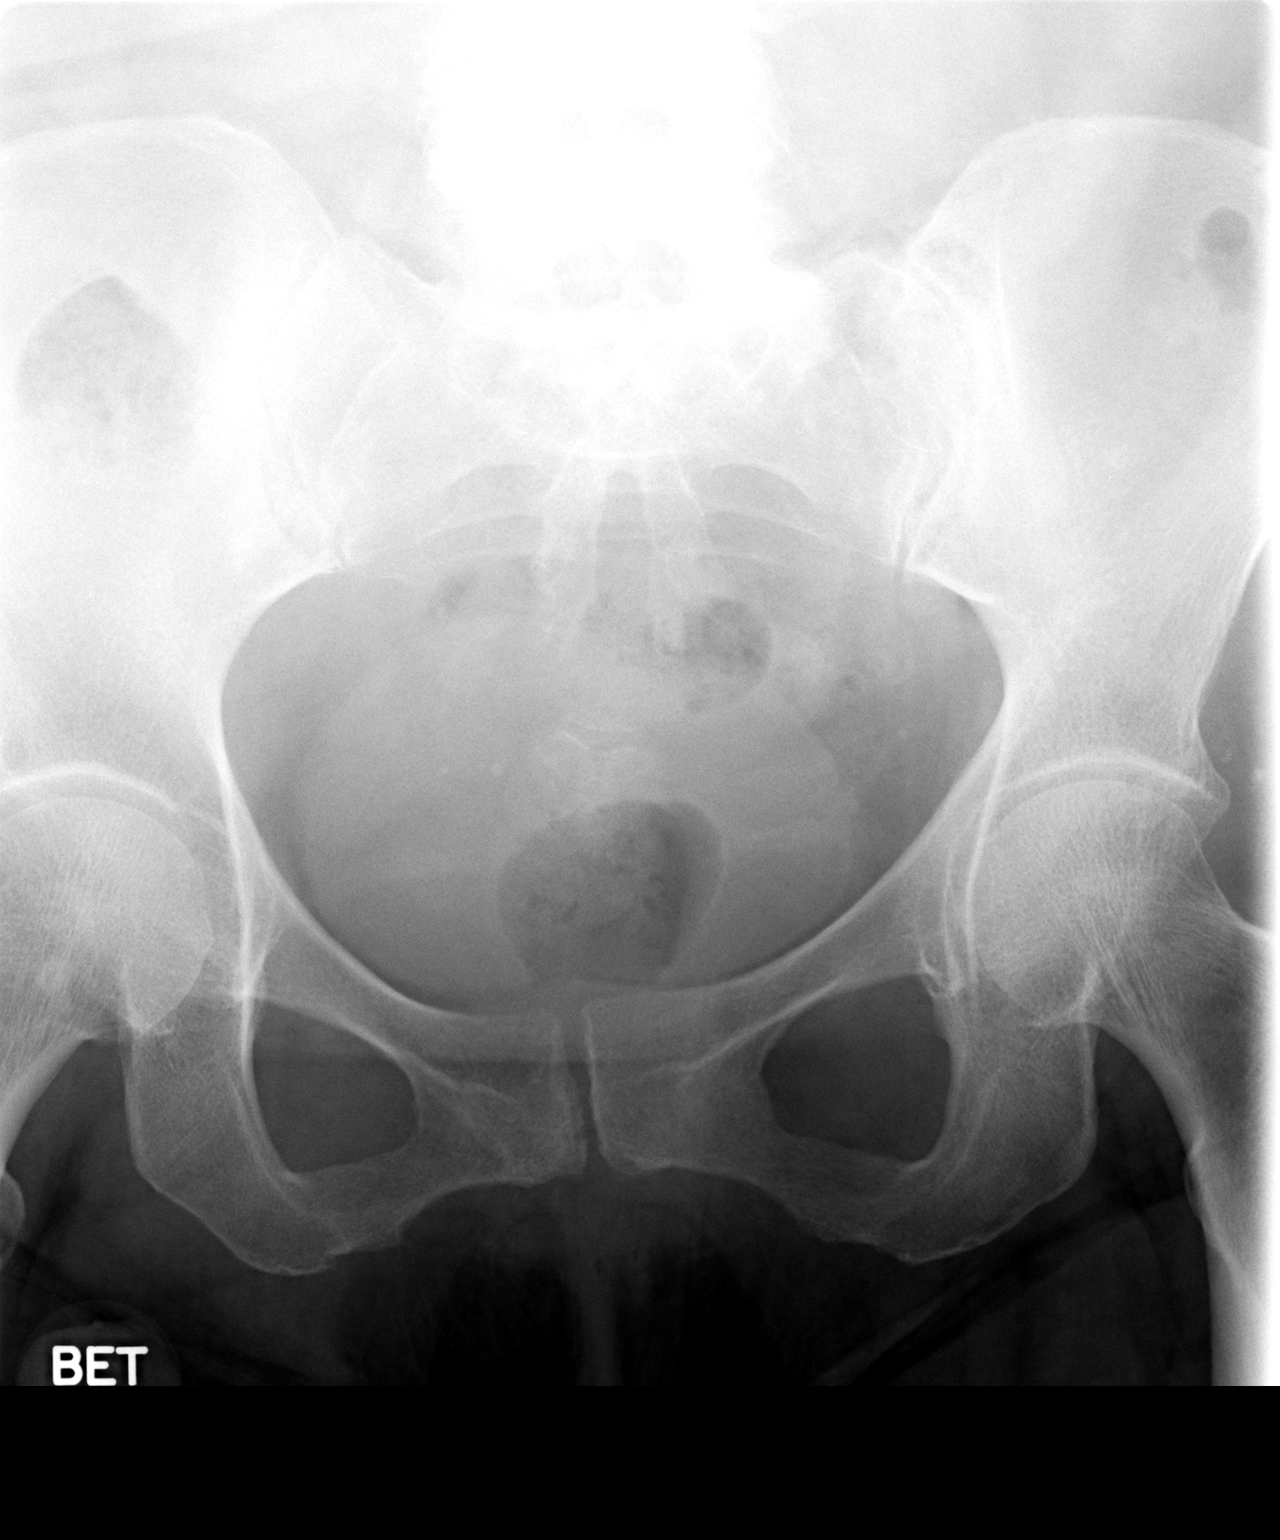

[view not recorded (2 of 3)]
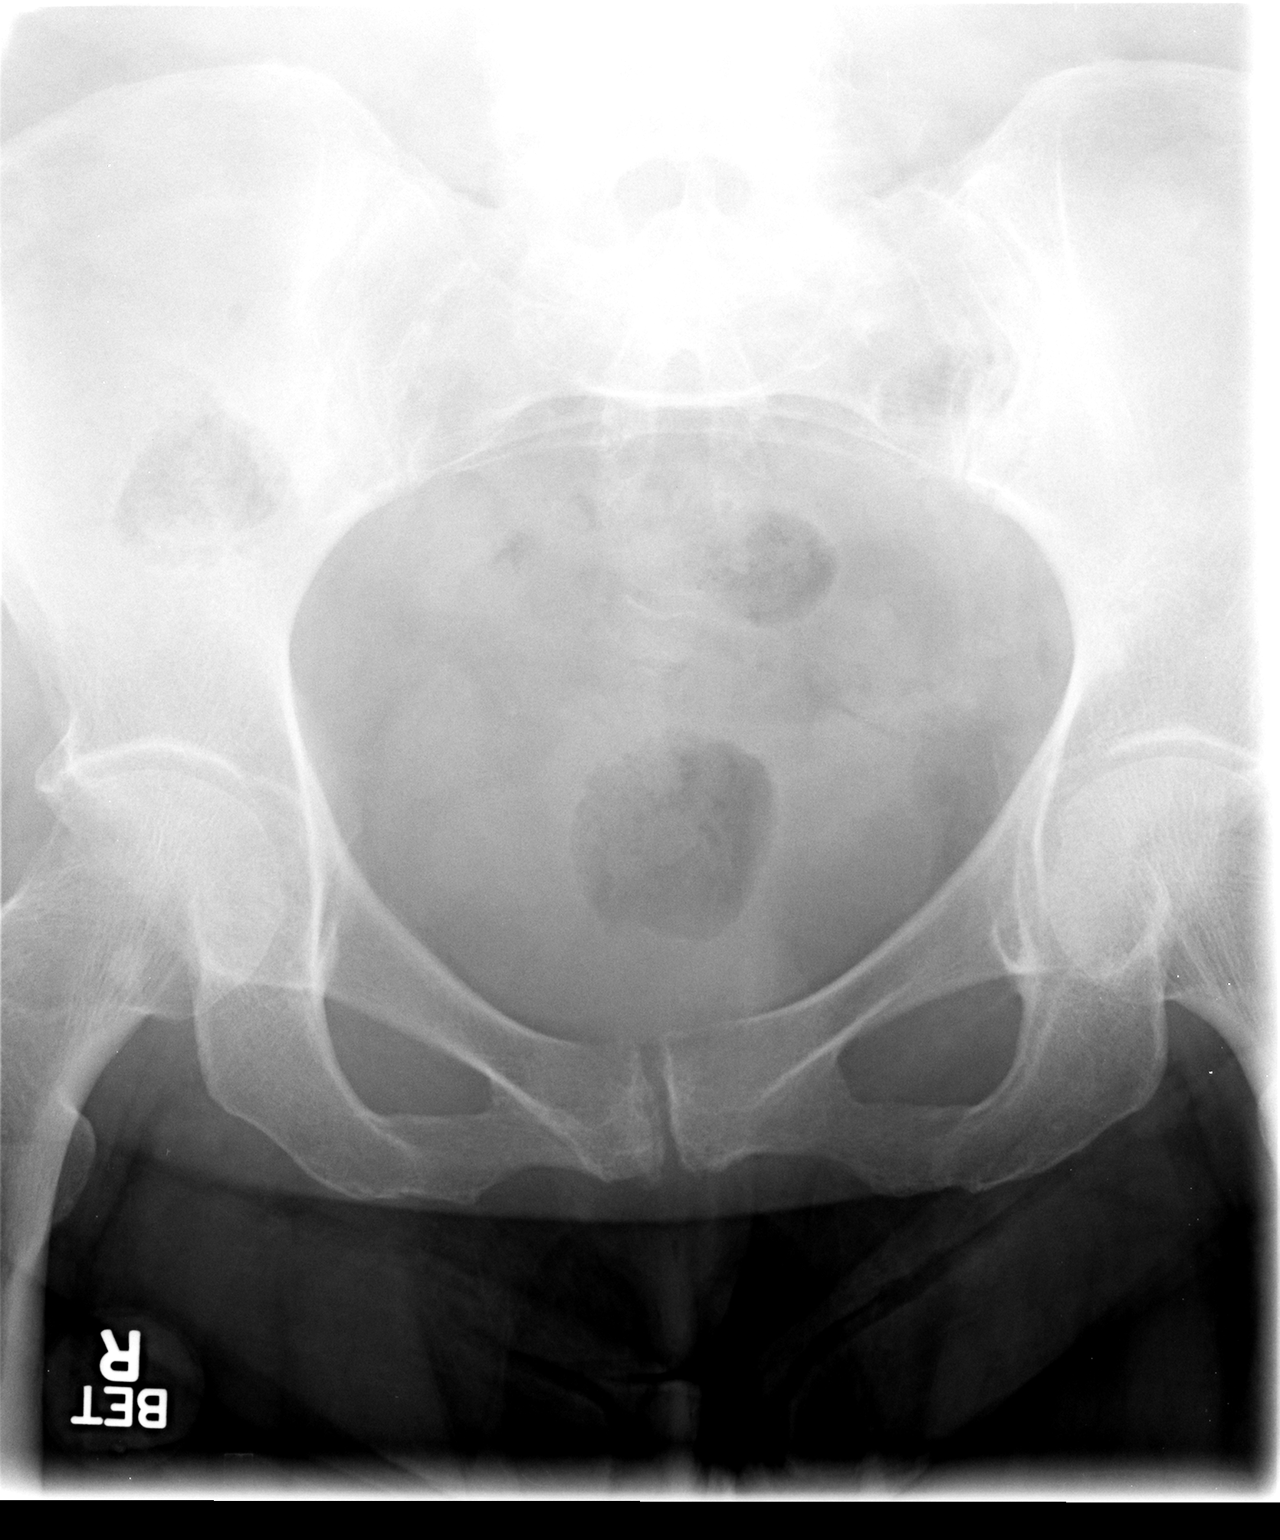

[view not recorded (3 of 3)]
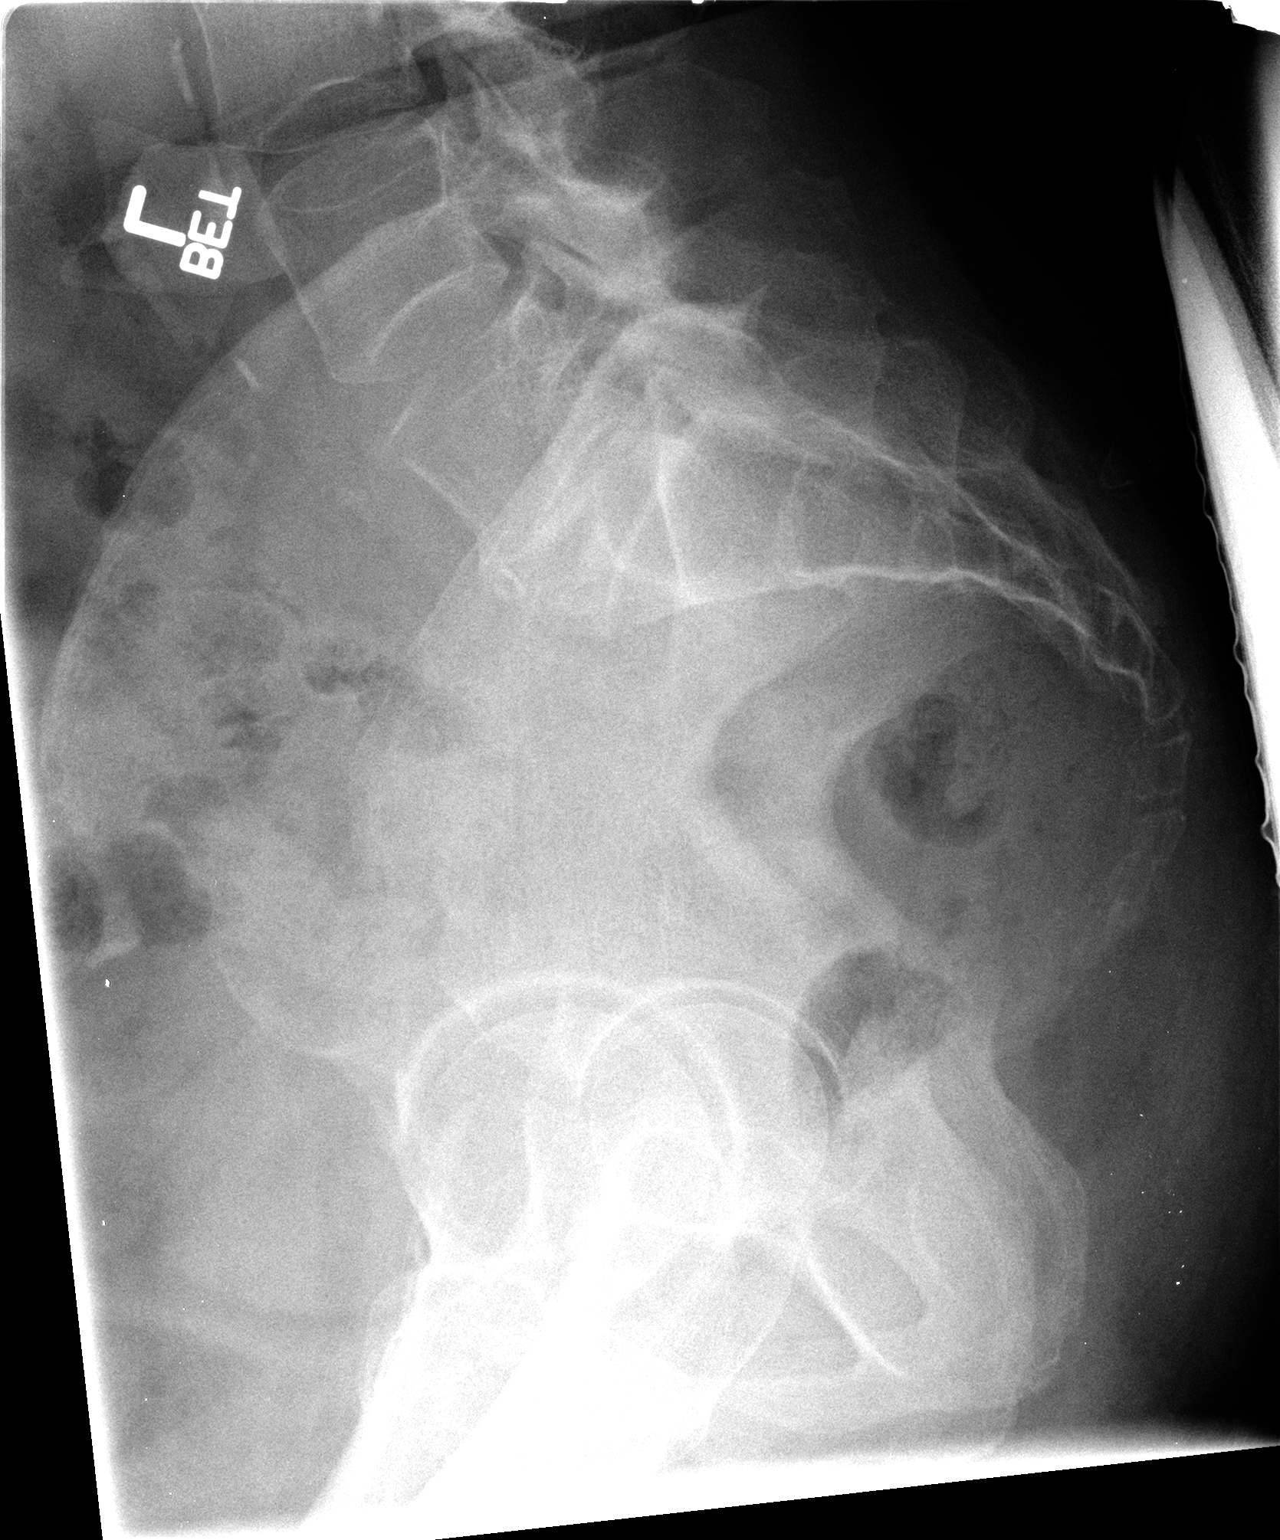

[3 of 3 positions shown; findings below may reference images not displayed]

FINDINGS: There is no evidence of fracture or other focal bone
lesions.
IMPRESSION: Negative.

## 2014-11-06 IMAGING — CR DG PELVIS 1-2V
1 series · 1 of 1 positions shown · non-contrast
Comparison: 03/11/2012

CLINICAL DATA: Fall.  Pelvic pain.

PELVIS - 1-2 VIEW

[view not recorded]
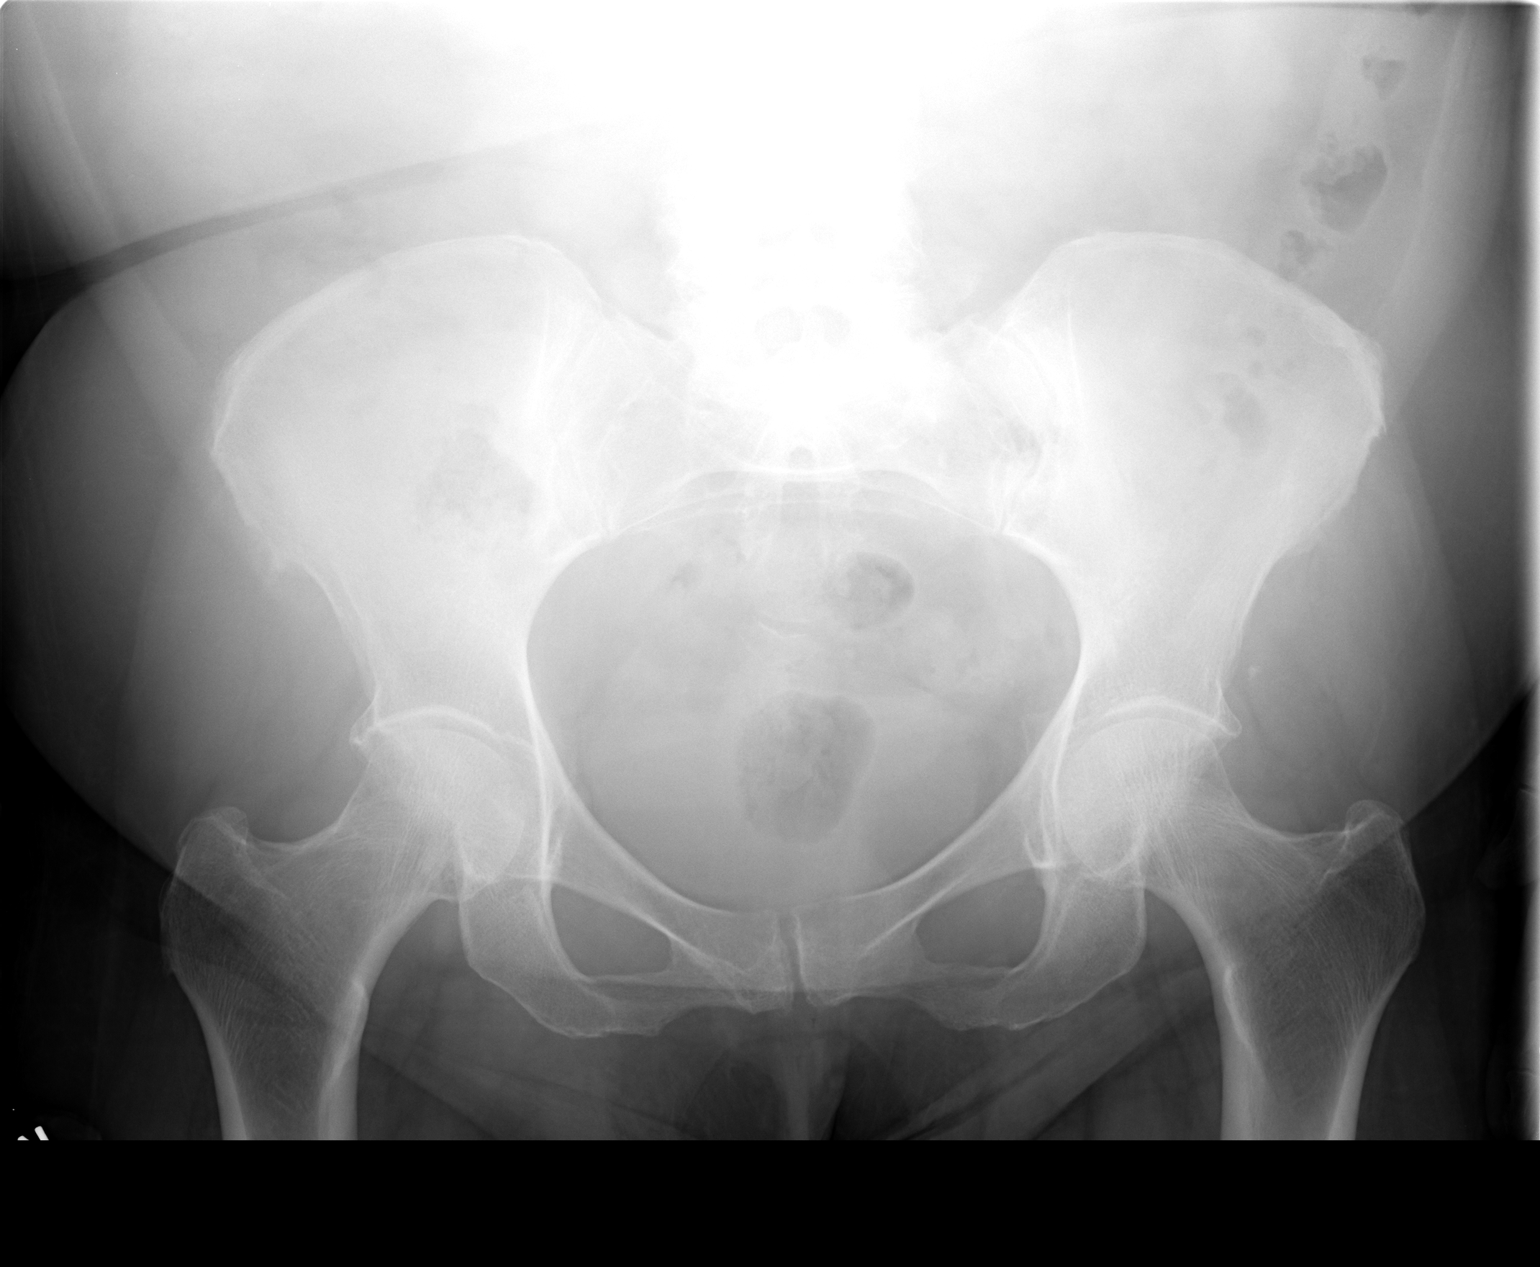

[1 of 1 positions shown; findings below may reference images not displayed]

FINDINGS: There is no evidence of pelvic fracture or diastasis.
No other pelvic bone lesions are seen.
IMPRESSION: Negative.

## 2014-11-09 ENCOUNTER — Ambulatory Visit (INDEPENDENT_AMBULATORY_CARE_PROVIDER_SITE_OTHER): Payer: Medicare HMO | Admitting: Urology

## 2014-11-09 DIAGNOSIS — N201 Calculus of ureter: Secondary | ICD-10-CM | POA: Diagnosis not present

## 2014-11-25 ENCOUNTER — Ambulatory Visit: Payer: Medicare HMO | Admitting: Internal Medicine

## 2014-11-29 ENCOUNTER — Telehealth: Payer: Self-pay | Admitting: Family Medicine

## 2014-11-29 MED ORDER — ALPRAZOLAM 0.5 MG PO TABS: ORAL_TABLET | ORAL | Status: DC

## 2014-11-29 MED ORDER — LOVASTATIN 40 MG PO TABS: 80.0000 mg | ORAL_TABLET | Freq: Every day | ORAL | Status: DC

## 2014-11-29 NOTE — Telephone Encounter (Signed)
meds sent

## 2014-11-30 ENCOUNTER — Ambulatory Visit: Payer: Self-pay | Admitting: Urology

## 2014-12-06 ENCOUNTER — Telehealth: Payer: Self-pay | Admitting: Family Medicine

## 2014-12-06 NOTE — Telephone Encounter (Signed)
Patient called back and I scheduled her for 6.28.16 at 2:00 she sounded bad stuffed up

## 2014-12-07 ENCOUNTER — Encounter: Payer: Self-pay | Admitting: Family Medicine

## 2014-12-07 ENCOUNTER — Ambulatory Visit (INDEPENDENT_AMBULATORY_CARE_PROVIDER_SITE_OTHER): Payer: Medicare Other | Admitting: Family Medicine

## 2014-12-07 VITALS — BP 120/70 | HR 68 | Temp 99.1°F | Resp 18 | Ht 66.0 in | Wt 190.0 lb

## 2014-12-07 DIAGNOSIS — R05 Cough: Secondary | ICD-10-CM

## 2014-12-07 DIAGNOSIS — N200 Calculus of kidney: Secondary | ICD-10-CM

## 2014-12-07 DIAGNOSIS — F329 Major depressive disorder, single episode, unspecified: Secondary | ICD-10-CM | POA: Diagnosis not present

## 2014-12-07 DIAGNOSIS — F411 Generalized anxiety disorder: Secondary | ICD-10-CM

## 2014-12-07 DIAGNOSIS — I10 Essential (primary) hypertension: Secondary | ICD-10-CM

## 2014-12-07 DIAGNOSIS — E785 Hyperlipidemia, unspecified: Secondary | ICD-10-CM

## 2014-12-07 DIAGNOSIS — E66811 Obesity, class 1: Secondary | ICD-10-CM

## 2014-12-07 DIAGNOSIS — F32A Depression, unspecified: Secondary | ICD-10-CM

## 2014-12-07 DIAGNOSIS — E1121 Type 2 diabetes mellitus with diabetic nephropathy: Secondary | ICD-10-CM | POA: Diagnosis not present

## 2014-12-07 DIAGNOSIS — E669 Obesity, unspecified: Secondary | ICD-10-CM

## 2014-12-07 DIAGNOSIS — R058 Other specified cough: Secondary | ICD-10-CM

## 2014-12-07 MED ORDER — PREDNISONE 5 MG PO TABS: 5.0000 mg | ORAL_TABLET | Freq: Two times a day (BID) | ORAL | Status: DC

## 2014-12-07 MED ORDER — MONTELUKAST SODIUM 10 MG PO TABS: 10.0000 mg | ORAL_TABLET | Freq: Every day | ORAL | Status: DC

## 2014-12-07 MED ORDER — METHYLPREDNISOLONE ACETATE 80 MG/ML IJ SUSP
80.0000 mg | Freq: Once | INTRAMUSCULAR | Status: AC
  Administered 2014-12-07: 80 mg via INTRAMUSCULAR

## 2014-12-07 NOTE — Patient Instructions (Signed)
Annual wellness in August as before, call if you need me sooner  You are treated for uncontrolled allergies with cough, Depo medrol in office today, 5 day course of prednisone and daily singulair  Sputum is sent for testing  Foot exam and microalb today  Blood pressure is good

## 2014-12-08 LAB — MICROALBUMIN / CREATININE URINE RATIO
Creatinine, Urine: 144.2 mg/dL
Microalb Creat Ratio: 47.9 mg/g — ABNORMAL HIGH (ref 0.0–30.0)
Microalb, Ur: 6.9 mg/dL — ABNORMAL HIGH (ref <2.0)

## 2014-12-10 LAB — RESPIRATORY CULTURE OR RESPIRATORY AND SPUTUM CULTURE: Organism ID, Bacteria: NORMAL

## 2014-12-13 NOTE — Assessment & Plan Note (Signed)
Hyperlipidemia:Low fat diet discussed and encouraged.   Lipid Panel  Lab Results  Component Value Date   CHOL 138 05/10/2014   HDL 42 05/10/2014   LDLCALC 67 05/10/2014   TRIG 143 05/10/2014   CHOLHDL 3.3 05/10/2014      Updated lab needed at/ before next visit.

## 2014-12-13 NOTE — Assessment & Plan Note (Signed)
Recent lithotripsy successful, pt in less pain

## 2014-12-13 NOTE — Assessment & Plan Note (Signed)
Controlled, no change in medication Cheryl Abbott is reminded of the importance of commitment to daily physical activity for 30 minutes or more, as able and the need to limit carbohydrate intake to 30 to 60 grams per meal to help with blood sugar control.   The need to take medication as prescribed, test blood sugar as directed, and to call between visits if there is a concern that blood sugar is uncontrolled is also discussed.   Cheryl Abbott is reminded of the importance of daily foot exam, annual eye examination, and good blood sugar, blood pressure and cholesterol control. Updated lab needed at/ before next visit.   Diabetic Labs Latest Ref Rng 12/07/2014 09/08/2014 05/10/2014 12/21/2013 11/26/2013  HbA1c <5.7 % - 7.1(H) 6.9(H) 6.5(H) -  Microalbumin <2.0 mg/dL 6.9(H) - - - -  Micro/Creat Ratio 0.0 - 30.0 mg/g 47.9(H) - - - -  Chol 0 - 200 mg/dL - - 138 - -  HDL >39 mg/dL - - 42 - -  Calc LDL 0 - 99 mg/dL - - 67 - -  Triglycerides <150 mg/dL - - 143 - -  Creatinine 0.50 - 1.10 mg/dL - 1.11(H) 1.00 - 1.15(H)   BP/Weight 12/07/2014 10/21/2014 10/14/2014 09/09/2014 08/04/2014 07/29/2014 [id number]  Systolic BP 123456 XX123456 123456 Q000111Q 123456 123456 [id number]  Diastolic BP 70 87 74 82 66 73 81  Wt. (Lbs) 190 191.5 192.2 190 196.04 195 195  BMI 30.68 30.92 31.04 30.68 31.66 31.49 31.49   Foot/eye exam completion dates Latest Ref Rng 12/07/2014 04/20/2014  Eye Exam No Retinopathy - No Retinopathy  Foot exam Order - - -  Foot Form Completion - Done -

## 2014-12-13 NOTE — Assessment & Plan Note (Signed)
Unchanged Patient re-educated about  the importance of commitment to a  minimum of 150 minutes of exercise per week.  The importance of healthy food choices with portion control discussed. Encouraged to start a food diary, count calories and to consider  joining a support group. Sample diet sheets offered. Goals set by the patient for the next several months.   Weight /BMI 12/07/2014 10/21/2014 10/14/2014  WEIGHT 190 lb 191 lb 8 oz 192 lb 3.2 oz  HEIGHT 5\' 6"  5\' 6"  5\' 6"   BMI 30.68 kg/m2 30.92 kg/m2 31.04 kg/m2    Current exercise per week 90 minutes.

## 2014-12-13 NOTE — Assessment & Plan Note (Signed)
Controlled, no change in medication DASH diet and commitment to daily physical activity for a minimum of 30 minutes discussed and encouraged, as a part of hypertension management. The importance of attaining a healthy weight is also discussed.  BP/Weight 12/07/2014 10/21/2014 10/14/2014 09/09/2014 08/04/2014 07/29/2014 [id number]  Systolic BP 123456 XX123456 123456 Q000111Q 123456 123456 [id number]  Diastolic BP 70 87 74 82 66 73 81  Wt. (Lbs) 190 191.5 192.2 190 196.04 195 195  BMI 30.68 30.92 31.04 30.68 31.66 31.49 31.49

## 2014-12-13 NOTE — Progress Notes (Signed)
Cheryl Abbott     MRN: [id number]      DOB: 1946-01-15   HPI Cheryl Abbott is here with main c/o 2 week h/o increased nasal congestion, facial pressure, thick drainage and excess productive cough. Denies fever or chills.  re-evaluation of chronic medical conditions, medication management and review of any available recent lab and radiology data is also done Preventive health is updated, specifically  Cancer screening and Immunization.   Questions or concerns regarding consultations or procedures which the PT has had in the interim are  addressed. The PT denies any adverse reactions to current medications since the last visit.  There are no new concerns.  There are no specific complaints   ROS Denies recent fever or chills. C/o  sinus pressure, nasal congestion, denies  ear pain or sore throat.c/o  chest congestion, productive cough denies  wheezing. Denies chest pains, palpitations and leg swelling Denies abdominal pain, nausea, vomiting,diarrhea or constipation.   Denies dysuria, frequency, hesitancy or incontinence. Denies joint pain, swelling and limitation in mobility. Denies headaches, seizures, numbness, or tingling. Denies depression, anxiety or insomnia. Denies skin break down or rash.   PE  BP 120/70 mmHg  Pulse 68  Temp(Src) 99.1 F (37.3 C)  Resp 18  Ht 5\' 6"  (1.676 m)  Wt 190 lb (86.183 kg)  BMI 30.68 kg/m2  SpO2 96%  Patient alert and oriented and in no cardiopulmonary distress.  HEENT: No facial asymmetry, EOMI,   oropharynx pink and moist.  Neck supple no JVD, no mass.Sinuses non tender, excess thick drainage from nostrils and able tpo expectorate mucus also  Chest: Clear to auscultation bilaterally.  CVS: S1, S2 no murmurs, no S3.Regular rate.  ABD: Soft non tender.   Ext: No edema  MS: Adequate ROM spine, shoulders, hips and knees.  Skin: Intact, no ulcerations or rash noted.  Psych: Good eye contact, normal affect. Memory intact not anxious or  depressed appearing.  CNS: CN 2-12 intact, power,  normal throughout.no focal deficits noted.   Assessment & Plan   Allergic cough Cough and congestion with thick sputum, likely allergic, however DUE TO PROLONGED NATURE, SPUTUM SENT FOR TESTING Depo medrol in office and short course of prdnisone  Hypertension goal BP (blood pressure) < 130/80 Controlled, no change in medication DASH diet and commitment to daily physical activity for a minimum of 30 minutes discussed and encouraged, as a part of hypertension management. The importance of attaining a healthy weight is also discussed.  BP/Weight 12/07/2014 10/21/2014 10/14/2014 09/09/2014 08/04/2014 07/29/2014 [id number]  Systolic BP 123456 XX123456 123456 Q000111Q 123456 123456 [id number]  Diastolic BP 70 87 74 82 66 73 81  Wt. (Lbs) 190 191.5 192.2 190 196.04 195 195  BMI 30.68 30.92 31.04 30.68 31.66 31.49 31.49        Type 2 diabetes with nephropathy Controlled, no change in medication Cheryl Abbott is reminded of the importance of commitment to daily physical activity for 30 minutes or more, as able and the need to limit carbohydrate intake to 30 to 60 grams per meal to help with blood sugar control.   The need to take medication as prescribed, test blood sugar as directed, and to call between visits if there is a concern that blood sugar is uncontrolled is also discussed.   Cheryl Abbott is reminded of the importance of daily foot exam, annual eye examination, and good blood sugar, blood pressure and cholesterol control. Updated lab needed at/ before next visit.   Diabetic Labs  Latest Ref Rng 12/07/2014 09/08/2014 05/10/2014 12/21/2013 11/26/2013  HbA1c <5.7 % - 7.1(H) 6.9(H) 6.5(H) -  Microalbumin <2.0 mg/dL 6.9(H) - - - -  Micro/Creat Ratio 0.0 - 30.0 mg/g 47.9(H) - - - -  Chol 0 - 200 mg/dL - - 138 - -  HDL >39 mg/dL - - 42 - -  Calc LDL 0 - 99 mg/dL - - 67 - -  Triglycerides <150 mg/dL - - 143 - -  Creatinine 0.50 - 1.10 mg/dL - 1.11(H) 1.00 - 1.15(H)    BP/Weight 12/07/2014 10/21/2014 10/14/2014 09/09/2014 08/04/2014 07/29/2014 [id number]  Systolic BP 123456 XX123456 123456 Q000111Q 123456 123456 [id number]  Diastolic BP 70 87 74 82 66 73 81  Wt. (Lbs) 190 191.5 192.2 190 196.04 195 195  BMI 30.68 30.92 31.04 30.68 31.66 31.49 31.49   Foot/eye exam completion dates Latest Ref Rng 12/07/2014 04/20/2014  Eye Exam No Retinopathy - No Retinopathy  Foot exam Order - - -  Foot Form Completion - Done -         Depression Controlled, no change in medication   Generalized anxiety disorder Controlled, no change in medication   Obesity (BMI 30.0-34.9) Unchanged Patient re-educated about  the importance of commitment to a  minimum of 150 minutes of exercise per week.  The importance of healthy food choices with portion control discussed. Encouraged to start a food diary, count calories and to consider  joining a support group. Sample diet sheets offered. Goals set by the patient for the next several months.   Weight /BMI 12/07/2014 10/21/2014 10/14/2014  WEIGHT 190 lb 191 lb 8 oz 192 lb 3.2 oz  HEIGHT 5\' 6"  5\' 6"  5\' 6"   BMI 30.68 kg/m2 30.92 kg/m2 31.04 kg/m2    Current exercise per week 90 minutes.   RENAL CALCULUS Recent lithotripsy successful, pt in less pain  Hyperlipidemia LDL goal <100 Hyperlipidemia:Low fat diet discussed and encouraged.   Lipid Panel  Lab Results  Component Value Date   CHOL 138 05/10/2014   HDL 42 05/10/2014   LDLCALC 67 05/10/2014   TRIG 143 05/10/2014   CHOLHDL 3.3 05/10/2014      Updated lab needed at/ before next visit.

## 2014-12-13 NOTE — Assessment & Plan Note (Signed)
Controlled, no change in medication  

## 2014-12-13 NOTE — Assessment & Plan Note (Signed)
Cough and congestion with thick sputum, likely allergic, however DUE TO PROLONGED NATURE, SPUTUM SENT FOR TESTING Depo medrol in office and short course of prdnisone

## 2014-12-14 ENCOUNTER — Ambulatory Visit (HOSPITAL_COMMUNITY): Payer: Medicare HMO | Admitting: Physical Therapy

## 2014-12-16 ENCOUNTER — Ambulatory Visit (HOSPITAL_COMMUNITY): Payer: Medicare HMO | Admitting: Physical Therapy

## 2015-01-11 ENCOUNTER — Emergency Department (HOSPITAL_COMMUNITY)
Admission: EM | Admit: 2015-01-11 | Discharge: 2015-01-11 | Disposition: A | Discharge status: Home / Self Care | Payer: Medicare Other | Attending: Emergency Medicine | Admitting: Emergency Medicine

## 2015-01-11 ENCOUNTER — Encounter (HOSPITAL_COMMUNITY): Payer: Self-pay | Admitting: Cardiology

## 2015-01-11 ENCOUNTER — Emergency Department (HOSPITAL_COMMUNITY): Payer: Medicare Other

## 2015-01-11 DIAGNOSIS — E119 Type 2 diabetes mellitus without complications: Secondary | ICD-10-CM | POA: Diagnosis not present

## 2015-01-11 DIAGNOSIS — G4733 Obstructive sleep apnea (adult) (pediatric): Secondary | ICD-10-CM | POA: Insufficient documentation

## 2015-01-11 DIAGNOSIS — Z87891 Personal history of nicotine dependence: Secondary | ICD-10-CM | POA: Insufficient documentation

## 2015-01-11 DIAGNOSIS — Z87442 Personal history of urinary calculi: Secondary | ICD-10-CM | POA: Diagnosis not present

## 2015-01-11 DIAGNOSIS — M545 Low back pain: Secondary | ICD-10-CM | POA: Insufficient documentation

## 2015-01-11 DIAGNOSIS — Z9981 Dependence on supplemental oxygen: Secondary | ICD-10-CM | POA: Diagnosis not present

## 2015-01-11 DIAGNOSIS — Z86010 Personal history of colonic polyps: Secondary | ICD-10-CM | POA: Insufficient documentation

## 2015-01-11 DIAGNOSIS — R109 Unspecified abdominal pain: Secondary | ICD-10-CM | POA: Diagnosis present

## 2015-01-11 DIAGNOSIS — Z7952 Long term (current) use of systemic steroids: Secondary | ICD-10-CM | POA: Insufficient documentation

## 2015-01-11 DIAGNOSIS — F329 Major depressive disorder, single episode, unspecified: Secondary | ICD-10-CM | POA: Insufficient documentation

## 2015-01-11 DIAGNOSIS — F419 Anxiety disorder, unspecified: Secondary | ICD-10-CM | POA: Insufficient documentation

## 2015-01-11 DIAGNOSIS — Z79899 Other long term (current) drug therapy: Secondary | ICD-10-CM | POA: Diagnosis not present

## 2015-01-11 DIAGNOSIS — Z880 Allergy status to penicillin: Secondary | ICD-10-CM | POA: Diagnosis not present

## 2015-01-11 DIAGNOSIS — N182 Chronic kidney disease, stage 2 (mild): Secondary | ICD-10-CM

## 2015-01-11 DIAGNOSIS — M549 Dorsalgia, unspecified: Secondary | ICD-10-CM

## 2015-01-11 DIAGNOSIS — I10 Essential (primary) hypertension: Secondary | ICD-10-CM | POA: Diagnosis not present

## 2015-01-11 DIAGNOSIS — E785 Hyperlipidemia, unspecified: Secondary | ICD-10-CM | POA: Diagnosis not present

## 2015-01-11 DIAGNOSIS — N390 Urinary tract infection, site not specified: Secondary | ICD-10-CM | POA: Diagnosis not present

## 2015-01-11 DIAGNOSIS — G8929 Other chronic pain: Secondary | ICD-10-CM | POA: Insufficient documentation

## 2015-01-11 DIAGNOSIS — K219 Gastro-esophageal reflux disease without esophagitis: Secondary | ICD-10-CM | POA: Insufficient documentation

## 2015-01-11 DIAGNOSIS — Z9889 Other specified postprocedural states: Secondary | ICD-10-CM | POA: Insufficient documentation

## 2015-01-11 DIAGNOSIS — N200 Calculus of kidney: Secondary | ICD-10-CM | POA: Diagnosis not present

## 2015-01-11 DIAGNOSIS — G629 Polyneuropathy, unspecified: Secondary | ICD-10-CM | POA: Insufficient documentation

## 2015-01-11 DIAGNOSIS — F411 Generalized anxiety disorder: Secondary | ICD-10-CM

## 2015-01-11 DIAGNOSIS — R300 Dysuria: Secondary | ICD-10-CM

## 2015-01-11 DIAGNOSIS — Z862 Personal history of diseases of the blood and blood-forming organs and certain disorders involving the immune mechanism: Secondary | ICD-10-CM | POA: Diagnosis not present

## 2015-01-11 DIAGNOSIS — I700 Atherosclerosis of aorta: Secondary | ICD-10-CM | POA: Diagnosis not present

## 2015-01-11 DIAGNOSIS — N2889 Other specified disorders of kidney and ureter: Secondary | ICD-10-CM | POA: Diagnosis not present

## 2015-01-11 LAB — CBC WITH DIFFERENTIAL/PLATELET
Basophils Absolute: 0 10*3/uL (ref 0.0–0.1)
Basophils Relative: 0 % (ref 0–1)
Eosinophils Absolute: 0.3 10*3/uL (ref 0.0–0.7)
Eosinophils Relative: 3 % (ref 0–5)
HCT: 34.8 % — ABNORMAL LOW (ref 36.0–46.0)
Hemoglobin: 11.1 g/dL — ABNORMAL LOW (ref 12.0–15.0)
Lymphocytes Relative: 28 % (ref 12–46)
Lymphs Abs: 2.5 10*3/uL (ref 0.7–4.0)
MCH: 27.6 pg (ref 26.0–34.0)
MCHC: 31.9 g/dL (ref 30.0–36.0)
MCV: 86.6 fL (ref 78.0–100.0)
Monocytes Absolute: 0.9 10*3/uL (ref 0.1–1.0)
Monocytes Relative: 10 % (ref 3–12)
Neutro Abs: 5.2 10*3/uL (ref 1.7–7.7)
Neutrophils Relative %: 59 % (ref 43–77)
Platelets: 390 10*3/uL (ref 150–400)
RBC: 4.02 MIL/uL (ref 3.87–5.11)
RDW: 17.3 % — ABNORMAL HIGH (ref 11.5–15.5)
WBC: 8.9 10*3/uL (ref 4.0–10.5)

## 2015-01-11 LAB — BASIC METABOLIC PANEL
Anion gap: 11 (ref 5–15)
BUN: 17 mg/dL (ref 6–20)
CO2: 25 mmol/L (ref 22–32)
Calcium: 10 mg/dL (ref 8.9–10.3)
Chloride: 101 mmol/L (ref 101–111)
Creatinine, Ser: 1.1 mg/dL — ABNORMAL HIGH (ref 0.44–1.00)
GFR calc Af Amer: 58 mL/min — ABNORMAL LOW (ref >60)
GFR calc non Af Amer: 50 mL/min — ABNORMAL LOW (ref >60)
Glucose, Bld: 130 mg/dL — ABNORMAL HIGH (ref 65–99)
Potassium: 4.3 mmol/L (ref 3.5–5.1)
Sodium: 137 mmol/L (ref 135–145)

## 2015-01-11 LAB — URINALYSIS, ROUTINE W REFLEX MICROSCOPIC
Bilirubin Urine: NEGATIVE
Glucose, UA: NEGATIVE mg/dL
Nitrite: NEGATIVE
Specific Gravity, Urine: 1.02 (ref 1.005–1.030)
Urobilinogen, UA: 0.2 mg/dL (ref 0.0–1.0)
pH: 6.5 (ref 5.0–8.0)

## 2015-01-11 LAB — URINE MICROSCOPIC-ADD ON

## 2015-01-11 MED ORDER — CIPROFLOXACIN IN D5W 400 MG/200ML IV SOLN
400.0000 mg | Freq: Once | INTRAVENOUS | Status: AC
  Administered 2015-01-11: 400 mg via INTRAVENOUS
  Filled 2015-01-11: qty 200

## 2015-01-11 MED ORDER — MORPHINE SULFATE 2 MG/ML IJ SOLN: 2.0000 mg | Freq: Once | INTRAMUSCULAR | Status: DC

## 2015-01-11 MED ORDER — PHENAZOPYRIDINE HCL 100 MG PO TABS
200.0000 mg | ORAL_TABLET | Freq: Three times a day (TID) | ORAL | Status: DC
  Administered 2015-01-11: 200 mg via ORAL
  Filled 2015-01-11: qty 2

## 2015-01-11 MED ORDER — CIPROFLOXACIN HCL 500 MG PO TABS: 500.0000 mg | ORAL_TABLET | Freq: Two times a day (BID) | ORAL | Status: DC

## 2015-01-11 MED ORDER — MORPHINE SULFATE 2 MG/ML IJ SOLN
2.0000 mg | INTRAMUSCULAR | Status: DC | PRN
  Administered 2015-01-11: 2 mg via INTRAVENOUS
  Filled 2015-01-11: qty 1

## 2015-01-11 MED ORDER — PHENAZOPYRIDINE HCL 100 MG PO TABS: 100.0000 mg | ORAL_TABLET | Freq: Three times a day (TID) | ORAL | Status: DC

## 2015-01-11 MED ORDER — HYDROCODONE-ACETAMINOPHEN 5-325 MG PO TABS
1.0000 | ORAL_TABLET | Freq: Once | ORAL | Status: AC
  Administered 2015-01-11: 1 via ORAL
  Filled 2015-01-11: qty 1

## 2015-01-11 MED ORDER — SODIUM CHLORIDE 0.9 % IV SOLN
1000.0000 mL | Freq: Once | INTRAVENOUS | Status: AC
  Administered 2015-01-11: 1000 mL via INTRAVENOUS

## 2015-01-11 NOTE — ED Notes (Addendum)
Bilateral flank pain,  Lower back pain that radiates into both legs.  Times one month.  Pain is worse today. Recently had lithotripsy.

## 2015-01-11 NOTE — ED Provider Notes (Signed)
CSN: [id number]     Arrival date & time 01/11/15  1702 History   First MD Initiated Contact with Patient 01/11/15 1721     Chief Complaint  Patient presents with  . Flank Pain      HPI Pt was seen at 1740. Per pt, c/o gradual onset and persistence of constant bilat low back "pain" for the past 1 month, wore over the past 2 weeks. Has been associated with urinary frequency and "feeling like it's hard to urinate." Pt states 3 months ago "the Urologist had to go get my kidney stones out," and her symptoms "feel the same." Denies dysuria/hematuria, no abd pain, no N/V/D, no fevers, no rash.  Denies incont/retention of bowel or bladder, no saddle anesthesia, no focal motor weakness, no tingling/numbness in extremities.   Uro: Dr. Diona Fanti Past Medical History  Diagnosis Date  . Anxiety disorder   . Hyperlipidemia   . Hypertension   . Complication of anesthesia     pt states she was not given enough medication with  tcs with Dr Tamala Julian                                    last [place].as able to feel and hear everything.  . Depression   . Sciatica   . Chronic back pain   . Headache   . Type 2 diabetes mellitus   . GERD (gastroesophageal reflux disease)   . Anemia   . History of adenomatous polyp of colon     2008  . Fatty liver   . Sigmoid diverticulosis   . Left ureteral stone   . Nephrolithiasis   . History of kidney stones   . Lumbar disc disease with radiculopathy   . Arthritis   . Neuropathy, peripheral   . Hypercalcemia   . OSA treated with BiPAP     per study 2007   Past Surgical History  Procedure Laterality Date  . Flexible sigmoidoscopy  09/11/2011    [id number] Internal hemorrhoids  . Esophagogastroduodenoscopy (egd) with propofol N/A 03/17/2013    Procedure: ESOPHAGOGASTRODUODENOSCOPY (EGD) WITH PROPOFOL;  Surgeon: Danie Binder, MD;  Location: [facility];  Service: Endoscopy;  Laterality: N/A;  . Savory dilation N/A 03/17/2013    Procedure: SAVORY DILATION;  Surgeon: Danie Binder, MD;  Location: [facility];  Service: Endoscopy;  Laterality: N/A;  #12.8, 14, 15, 16 dilators used  . Esophageal biopsy N/A 03/17/2013    Procedure: GASTRIC BIOPSIES;  Surgeon: Danie Binder, MD;  Location: [facility];  Service: Endoscopy;  Laterality: N/A;  . Polypectomy N/A 03/17/2013    Procedure: GASTRIC POLYPECTOMY;  Surgeon: Danie Binder, MD;  Location: [facility];  Service: Endoscopy;  Laterality: N/A;  . Colonoscopy  last one 10/02/2011    MOD [place] TICS, IH, NEXT TCS APR 2018  . Cysto/   right ureterosocopy laser lithotripsy stone extraction  10-13-2004  . Cysto/  right retrograde pyelogram/  placment right ureteral stent  01-10-2010  . Percutaneous nephrostolithotomy Bilateral 12/  2011     [facility]  . Cardiovascular stress test  01-01-2014    normal lexiscan cardiolite/  no ischemia/ infarct/  normal LV function and wall motion ,  ef 81%  . Transthoracic echocardiogram  01-01-2014    mild LVH/  ef [id number]  grade I diastolic dysfunction/  trivial MR, TR, and PR  . Cardiac catheterization  05-11-2003  dr Marcello Moores  kelly ([facility])    Abnormal cardiolite/   Normal coronary arteries and normal LVF,  ef  63%  . Removal right thigh cyst  2006  . Vaginal hysterectomy  1970's  . Cystoscopy with retrograde pyelogram, ureteroscopy and stent placement Left 10/21/2014    Procedure: CYSTOSCOPY WITH RETROGRADE PYELOGRAM, URETEROSCOPY AND STENT PLACEMENT;  Surgeon: Franchot Gallo, MD;  Location:  [facility];  Service: Urology;  Laterality: Left;  . Holmium laser application Left [id number]    Procedure: HOLMIUM LASER APPLICATION;  Surgeon: Franchot Gallo, MD;  Location: [facility];  Service: Urology;  Laterality: Left;   Family History  Problem Relation Age of Onset  . Hypertension Mother   . Diabetes Mother   . Heart failure Mother   . Dementia Mother   . Emphysema Father   . Hypertension Father   . Diabetes Brother   . GER disease Brother   .  Cancer      family history   . Diabetes      family history   . Heart defect      famiily history   . Arthritis      family history   . Anesthesia problems Neg Hx   . Hypotension Neg Hx   . Malignant hyperthermia Neg Hx   . Pseudochol deficiency Neg Hx   . Colon cancer Neg Hx    History  Substance Use Topics  . Smoking status: Former Smoker -- 1.00 packs/day for 20 years    Types: Cigarettes    Quit date: 09/21/1994  . Smokeless tobacco: Never Used  . Alcohol Use: No   OB History    Gravida Para Term Preterm AB TAB SAB Ectopic Multiple Living   2 2 2       2      Review of Systems ROS: Statement: All systems negative except as marked or noted in the HPI; Constitutional: Negative for fever and chills. ; ; Eyes: Negative for eye pain, redness and discharge. ; ; ENMT: Negative for ear pain, hoarseness, nasal congestion, sinus pressure and sore throat. ; ; Cardiovascular: Negative for chest pain, palpitations, diaphoresis, dyspnea and peripheral edema. ; ; Respiratory: Negative for cough, wheezing and stridor. ; ; Gastrointestinal: Negative for nausea, vomiting, diarrhea, abdominal pain, blood in stool, hematemesis, jaundice and rectal bleeding. . ; ; Genitourinary: +urinary frequency. Negative for flank pain and hematuria. ; ; Musculoskeletal: +LBP. Negative for neck pain. Negative for swelling and trauma.; ; Skin: Negative for pruritus, rash, abrasions, blisters, bruising and skin lesion.; ; Neuro: Negative for headache, lightheadedness and neck stiffness. Negative for weakness, altered level of consciousness , altered mental status, extremity weakness, paresthesias, involuntary movement, seizure and syncope.      Allergies  Ace inhibitors; Keflex; Nitrofurantoin; and Penicillins  Home Medications   Prior to Admission medications   Medication Sig Start Date End Date Taking? Authorizing Provider  ALPRAZolam Duanne Moron) 0.5 MG tablet TAKE 1 TABLET BY MOUTH TWICE A DAY 11/29/14   [name], MD  amLODipine (NORVASC) 10 MG tablet Take 1 tablet (10 mg total) by mouth daily. Patient taking differently: Take 10 mg by mouth every morning.  09/09/14   [name], MD  diphenhydramine-acetaminophen (TYLENOL PM) 25-500 MG TABS Take 1 tablet by mouth at bedtime as needed.    Historical Provider, MD  docusate sodium (COLACE) 100 MG capsule Take 200 mg by mouth every evening.    Historical Provider, MD  gabapentin (NEURONTIN) 100 MG  capsule Take 1 capsule (100 mg total) by mouth at bedtime. 09/09/14   [name], MD  lovastatin (MEVACOR) 40 MG tablet Take 2 tablets (80 mg total) by mouth at bedtime. 11/29/14   [name], MD  metFORMIN (GLUCOPHAGE) 500 MG tablet TAKE 2 TABLETS (1,000 MG TOTAL) BY MOUTH 2 (TWO) TIMES DAILY WITH A MEAL. 09/13/14   [name], MD  montelukast (SINGULAIR) 10 MG tablet Take 1 tablet (10 mg total) by mouth at bedtime. 12/07/14   [name], MD  oxybutynin (DITROPAN) 5 MG tablet Take 1 tablet (5 mg total) by mouth every 8 (eight) hours as needed for bladder spasms. 10/21/14   Franchot Gallo, MD  pantoprazole (PROTONIX) 40 MG tablet Take 1 tablet (40 mg total) by mouth 2 (two) times daily. 08/01/14   Carlis Stable, NP  PARoxetine (PAXIL) 40 MG tablet Take 1 tablet (40 mg total) by mouth every morning. 09/09/14   [name], MD  potassium citrate (UROCIT-K) 10 MEQ (1080 MG) SR tablet TAKE 1 TABLET 3 TIMES DAILY WITH MEALS. Patient taking differently: Take 10 mEq by mouth 3 (three) times daily with meals. TAKE 1 TABLET 3 TIMES DAILY WITH MEALS. 07/27/14   [name], MD  predniSONE (DELTASONE) 5 MG tablet Take 1 tablet (5 mg total) by mouth 2 (two) times daily with a meal. 12/07/14   [name], MD  traMADol (ULTRAM) 50 MG tablet TAKE 1 TABLET BY MOUTH TWICE A DAY 10/25/14   [name], MD  Vitamin D, Ergocalciferol, (DRISDOL) 50000 UNITS CAPS capsule TAKE 1 CAPSULE (50,000 UNITS TOTAL) BY MOUTH EVERY 7  (SEVEN) DAYS. 08/30/14   [name], MD   BP 132/68 mmHg  Pulse 105  Temp(Src) 97.3 F (36.3 C) (Oral)  Resp 16  Ht 5\' 7"  (1.702 m)  Wt 185 lb (83.915 kg)  BMI 28.97 kg/m2  SpO2 99%   Filed Vitals:   01/11/15 2030 01/11/15 2059 01/11/15 2100 01/11/15 2112  BP: 105/64 106/64 113/66 118/68  Pulse: 79 77 79   Temp:      TempSrc:      Resp:  18    Height:      Weight:      SpO2: 92% 95% 97%     Physical Exam  1745: Physical examination:  Nursing notes reviewed; Vital signs and O2 SAT reviewed;  Constitutional: Well developed, Well nourished, Well hydrated, In no acute distress; Head:  Normocephalic, atraumatic; Eyes: EOMI, PERRL, No scleral icterus; ENMT: Mouth and pharynx normal, Mucous membranes moist; Neck: Supple, Full range of motion, No lymphadenopathy; Cardiovascular: Regular rate and rhythm, No gallop; Respiratory: Breath sounds clear & equal bilaterally, No wheezes.  Speaking full sentences with ease, Normal respiratory effort/excursion; Chest: Nontender, Movement normal; Abdomen: Soft, Nontender, Nondistended, Normal bowel sounds; Genitourinary: +L>R CVA tenderness; Spine:  No midline CS, TS, LS tenderness. +TTP bilat lumbar paraspinal muscles. No rash.;;  Extremities: Pulses normal, No tenderness, No edema, No calf edema or asymmetry.; Neuro: AA&Ox3, vague historian. Major CN grossly intact.  Speech clear. No gross focal motor or sensory deficits in extremities. Climbs on and off stretcher easily by herself. Gait steady.; Skin: Color normal, Warm, Dry.    ED Course  Procedures     EKG Interpretation None      MDM  MDM Reviewed: previous chart, nursing note and vitals Reviewed previous: labs Interpretation: labs and CT scan     Results for orders placed or performed  during the hospital encounter of 01/11/15  Urinalysis, Routine w reflex microscopic (not at [facility])  Result Value Ref Range   Color, Urine YELLOW YELLOW   APPearance CLEAR CLEAR   Specific  Gravity, Urine 1.020 1.005 - 1.030   pH 6.5 5.0 - 8.0   Glucose, UA NEGATIVE NEGATIVE mg/dL   Hgb urine dipstick TRACE (A) NEGATIVE   Bilirubin Urine NEGATIVE NEGATIVE   Ketones, ur TRACE (A) NEGATIVE mg/dL   Protein, ur TRACE (A) NEGATIVE mg/dL   Urobilinogen, UA 0.2 0.0 - 1.0 mg/dL   Nitrite NEGATIVE NEGATIVE   Leukocytes, UA TRACE (A) NEGATIVE  Basic metabolic panel  Result Value Ref Range   Sodium 137 135 - 145 mmol/L   Potassium 4.3 3.5 - 5.1 mmol/L   Chloride 101 101 - 111 mmol/L   CO2 25 22 - 32 mmol/L   Glucose, Bld 130 (H) 65 - 99 mg/dL   BUN 17 6 - 20 mg/dL   Creatinine, Ser 1.10 (H) 0.44 - 1.00 mg/dL   Calcium 10.0 8.9 - 10.3 mg/dL   GFR calc non Af Amer 50 (L) >60 mL/min   GFR calc Af Amer 58 (L) >60 mL/min   Anion gap 11 5 - 15  CBC with Differential  Result Value Ref Range   WBC 8.9 4.0 - 10.5 K/uL   RBC 4.02 3.87 - 5.11 MIL/uL   Hemoglobin 11.1 (L) 12.0 - 15.0 g/dL   HCT 34.8 (L) 36.0 - 46.0 %   MCV 86.6 78.0 - 100.0 fL   MCH 27.6 26.0 - 34.0 pg   MCHC 31.9 30.0 - 36.0 g/dL   RDW 17.3 (H) 11.5 - 15.5 %   Platelets 390 150 - 400 K/uL   Neutrophils Relative % 59 43 - 77 %   Neutro Abs 5.2 1.7 - 7.7 K/uL   Lymphocytes Relative 28 12 - 46 %   Lymphs Abs 2.5 0.7 - 4.0 K/uL   Monocytes Relative 10 3 - 12 %   Monocytes Absolute 0.9 0.1 - 1.0 K/uL   Eosinophils Relative 3 0 - 5 %   Eosinophils Absolute 0.3 0.0 - 0.7 K/uL   Basophils Relative 0 0 - 1 %   Basophils Absolute 0.0 0.0 - 0.1 K/uL  Urine microscopic-add on  Result Value Ref Range   Squamous Epithelial / LPF MANY (A) RARE   WBC, UA 11-20 <3 WBC/hpf   RBC / HPF 3-6 <3 RBC/hpf   Bacteria, UA RARE RARE   Casts HYALINE CASTS (A) NEGATIVE   Crystals CA OXALATE CRYSTALS (A) NEGATIVE   Ct Renal Stone Study 01/11/2015   CLINICAL DATA:  Chronic bilateral flank pain and lower back pain, radiating down into both legs. Initial encounter.  EXAM: CT ABDOMEN AND PELVIS WITHOUT CONTRAST  TECHNIQUE: Multidetector CT  imaging of the abdomen and pelvis was performed following the standard protocol without IV contrast.  COMPARISON:  CT of the abdomen and pelvis from 05/18/2014  FINDINGS: Mild right basilar atelectasis is noted.  The liver and spleen are unremarkable in appearance. The gallbladder is within normal limits. The pancreas and adrenal glands are unremarkable.  Scattered bilateral nonobstructing renal stones are seen, measuring up to 1.4 cm in size. 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 Mild left renal scarring is noted. There is no evidence of hydronephrosis. No obstructing ureteral stones are identified at this time.  No free fluid is identified. The small bowel is unremarkable in appearance. The stomach is  within normal limits. No acute vascular abnormalities are seen. Mild scattered calcification is noted along the abdominal aorta and its branches.  The appendix is normal in caliber, without evidence of appendicitis. Mild diverticulosis is noted along the descending and sigmoid colon, without evidence of diverticulitis.  The bladder is mildly distended and grossly unremarkable. The patient is status post hysterectomy. No suspicious adnexal masses are seen. No inguinal lymphadenopathy is seen.  No acute osseous abnormalities are identified.  IMPRESSION: 1. No acute abnormality seen to explain the patient's symptoms. 2. Nonobstructing bilateral renal stones seen, measuring up to 1.4 cm in size. 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 Mild left renal scarring noted. 3. Mild diverticulosis along the descending and sigmoid colon, without evidence of diverticulitis. 4. Mild right basilar atelectasis noted. 5. Mild scattered calcification along the abdominal aorta and its branches.   Electronically Signed   By: Garald Balding M.D.   On: 01/11/2015 18:59    1950:  Pt has bilat CVAT on exam, UC is pending; will dose IV  cipro. Pt continues to c/o flank pain despite meds for same; will dose IV meds. T/C to [facility] Dr. Marily Memos, case discussed, including:  HPI, pertinent PM/SHx, VS/PE, dx testing, ED course and treatment:  Agreeable to come to ED for evaluation for possible admission.   2015:  [facility] Dr. Marily Memos has evaluated pt: states on his exam pt does not have LBP or CVAT when distracted, requests to dose IVF for hypotension, IV abx, have pt f/u with her Uro this week.   2100:  Pt persistently hypotensive despite IVF NS 1L bolus. IV cipro given. [facility] MD called and update: will re-evaluate.   2115:  Manual BP's are higher than monitor automatic BP's. Pt wants to go home. Hospitalist will d/c. Return precautions given; pt verb understanding.     Francine Graven, DO 01/14/15 [phone]

## 2015-01-11 NOTE — Consult Note (Addendum)
[facility] History and Physical  COBY BARTOLI [id number] DOB: April 06, 1946 DOA: 01/11/2015  Referring physician: Thurnell Garbe PCP: Tula Nakayama, MD   Chief Complaint: Dysuria  HPI: Cheryl Abbott is a 69 y.o. female  Dysuria: Ongoing for 5-6 weeks. Intermittent. Approximately 1-2 week history of intermittent bilateral flank pain. Symptoms are getting worse per patient. Patient with known chronic ongoing bilateral nephrolithiasis and has a history of lithotripsy as well as nephrostomy tubes in the past. No recent medication changes. No recent antibiotic. Nothing makes her symptoms better or worse. Denies fevers, abdominal pain, frequency, nausea, vomiting, constipation, diarrhea, chest pain, shortness of breath, palpitations, neck stiffness, headache.  Of note patient drinks 5-6, 8oz of either apple juice and/or water per day.  Review of Systems:   Per history of present illness with all other systems negative..   Past Medical History  Diagnosis Date  . Anxiety disorder   . Hyperlipidemia   . Hypertension   . Complication of anesthesia     pt states she was not given enough medication with  tcs with Dr Tamala Julian                                    last [place].as able to feel and hear everything.  . Depression   . Sciatica   . Chronic back pain   . Headache   . Type 2 diabetes mellitus   . GERD (gastroesophageal reflux disease)   . Anemia   . History of adenomatous polyp of colon     2008  . Fatty liver   . Sigmoid diverticulosis   . Left ureteral stone   . Nephrolithiasis   . History of kidney stones   . Lumbar disc disease with radiculopathy   . Arthritis   . Neuropathy, peripheral   . Hypercalcemia   . OSA treated with BiPAP     per study 2007   Past Surgical History  Procedure Laterality Date  . Flexible sigmoidoscopy  09/11/2011    [id number] Internal hemorrhoids  . Esophagogastroduodenoscopy (egd) with propofol N/A 03/17/2013    Procedure:  ESOPHAGOGASTRODUODENOSCOPY (EGD) WITH PROPOFOL;  Surgeon: Danie Binder, MD;  Location: [facility];  Service: Endoscopy;  Laterality: N/A;  . Savory dilation N/A 03/17/2013    Procedure: SAVORY DILATION;  Surgeon: Danie Binder, MD;  Location: [facility];  Service: Endoscopy;  Laterality: N/A;  #12.8, 14, 15, 16 dilators used  . Esophageal biopsy N/A 03/17/2013    Procedure: GASTRIC BIOPSIES;  Surgeon: Danie Binder, MD;  Location: [facility];  Service: Endoscopy;  Laterality: N/A;  . Polypectomy N/A 03/17/2013    Procedure: GASTRIC POLYPECTOMY;  Surgeon: Danie Binder, MD;  Location: [facility];  Service: Endoscopy;  Laterality: N/A;  . Colonoscopy  last one 10/02/2011    MOD [facility] TICS, IH, NEXT TCS APR 2018  . Cysto/   right ureterosocopy laser lithotripsy stone extraction  10-13-2004  . Cysto/  right retrograde pyelogram/  placment right ureteral stent  01-10-2010  . Percutaneous nephrostolithotomy Bilateral 12/  2011     [facility]  . Cardiovascular stress test  01-01-2014    normal lexiscan cardiolite/  no ischemia/ infarct/  normal LV function and wall motion ,  ef 81%  . Transthoracic echocardiogram  01-01-2014    mild LVH/  ef [id number]  grade I diastolic dysfunction/  trivial MR, TR, and PR  . Cardiac catheterization  05-11-2003  dr Shelva Majestic ([facility])    Abnormal cardiolite/   Normal coronary arteries and normal LVF,  ef  63%  . Removal right thigh cyst  2006  . Vaginal hysterectomy  1970's  . Cystoscopy with retrograde pyelogram, ureteroscopy and stent placement Left 10/21/2014    Procedure: CYSTOSCOPY WITH RETROGRADE PYELOGRAM, URETEROSCOPY AND STENT PLACEMENT;  Surgeon: Franchot Gallo, MD;  Location[facility];  Service: Urology;  Laterality: Left;  . Holmium laser application Left [id number]    Procedure: HOLMIUM LASER APPLICATION;  Surgeon: Franchot Gallo, MD;  Location: [facility];  Service: Urology;  Laterality: Left;   Social History:   reports that she quit smoking about 20 years ago. Her smoking use included Cigarettes. She has a 20 pack-year smoking history. She has never used smokeless tobacco. She reports that she does not drink alcohol or use illicit drugs.  Allergies  Allergen Reactions  . Ace Inhibitors Cough  . Keflex [Cephalexin] Diarrhea and Nausea And Vomiting  . Nitrofurantoin Diarrhea and Nausea And Vomiting  . Penicillins Hives, Rash and Other (See Comments)    Blisters on hands and feet    Family History  Problem Relation Age of Onset  . Hypertension Mother   . Diabetes Mother   . Heart failure Mother   . Dementia Mother   . Emphysema Father   . Hypertension Father   . Diabetes Brother   . GER disease Brother   . Cancer      family history   . Diabetes      family history   . Heart defect      famiily history   . Arthritis      family history   . Anesthesia problems Neg Hx   . Hypotension Neg Hx   . Malignant hyperthermia Neg Hx   . Pseudochol deficiency Neg Hx   . Colon cancer Neg Hx      Prior to Admission medications   Medication Sig Start Date End Date Taking? Authorizing Provider  ALPRAZolam (XANAX) 0.5 MG tablet TAKE 1 TABLET BY MOUTH TWICE A DAY Patient taking differently: Take 0.5 mg by mouth 2 (two) times daily. TAKE 1 TABLET BY MOUTH TWICE A DA 11/29/14  Yes [name], MD  amLODipine (NORVASC) 10 MG tablet Take 1 tablet (10 mg total) by mouth daily. Patient taking differently: Take 10 mg by mouth every morning.  09/09/14  Yes [name], MD  diphenhydramine-acetaminophen (TYLENOL PM) 25-500 MG TABS Take 1-2 tablets by mouth at bedtime as needed (for sleep/pain).    Yes Historical Provider, MD  docusate sodium (COLACE) 100 MG capsule Take 100-200 mg by mouth daily as needed for mild constipation or moderate constipation.    Yes Historical Provider, MD  gabapentin (NEURONTIN) 100 MG capsule Take 1 capsule (100 mg total) by mouth at bedtime. 09/09/14  Yes [name], MD  lovastatin (MEVACOR) 40 MG tablet Take 2 tablets (80 mg total) by mouth at bedtime. 11/29/14  Yes [name], MD  metFORMIN (GLUCOPHAGE) 500 MG tablet TAKE 2 TABLETS (1,000 MG TOTAL) BY MOUTH 2 (TWO) TIMES DAILY WITH A MEAL. 09/13/14  Yes [name], MD  pantoprazole (PROTONIX) 40 MG tablet Take 1 tablet (40 mg total) by mouth 2 (two) times daily. 08/01/14  Yes Carlis Stable, NP  PARoxetine (PAXIL) 40 MG tablet Take 1 tablet (40 mg total) by mouth every morning. 09/09/14  Yes [name],  MD  potassium citrate (UROCIT-K) 10 MEQ (1080 MG) SR tablet TAKE 1 TABLET 3 TIMES DAILY WITH MEALS. Patient taking differently: Take 10 mEq by mouth 3 (three) times daily with meals. TAKE 1 TABLET 3 TIMES DAILY WITH MEALS. 07/27/14  Yes [name], MD  traMADol (ULTRAM) 50 MG tablet TAKE 1 TABLET BY MOUTH TWICE A DAY 10/25/14  Yes [name], MD  Vitamin D, Ergocalciferol, (DRISDOL) 50000 UNITS CAPS capsule TAKE 1 CAPSULE (50,000 UNITS TOTAL) BY MOUTH EVERY 7 (SEVEN) DAYS. Patient taking differently: Take 50,000 Units by mouth every Sunday. TAKE 1 CAPSULE (50,000 UNITS TOTAL) BY MOUTH EVERY 7 (SEVEN) DAYS. 08/30/14  Yes [name], MD  montelukast (SINGULAIR) 10 MG tablet Take 1 tablet (10 mg total) by mouth at bedtime. Patient not taking: Reported on 01/11/2015 12/07/14   [name], MD  oxybutynin (DITROPAN) 5 MG tablet Take 1 tablet (5 mg total) by mouth every 8 (eight) hours as needed for bladder spasms. Patient not taking: Reported on 01/11/2015 10/21/14   Franchot Gallo, MD  predniSONE (DELTASONE) 5 MG tablet Take 1 tablet (5 mg total) by mouth 2 (two) times daily with a meal. Patient not taking: Reported on 01/11/2015 12/07/14   [name], MD   Physical Exam: Filed Vitals:   01/11/15 1711 01/11/15 1859 01/11/15 1926  BP: 132/68 106/71 104/66  Pulse: 105 97 100  Temp: 97.3 F (36.3 C)    TempSrc: Oral    Resp: 16 16 20   Height: 5\' 7"  (1.702  m)    Weight: 83.915 kg (185 lb)    SpO2: 99% 94% 98%    Wt Readings from Last 3 Encounters:  01/11/15 83.915 kg (185 lb)  12/07/14 86.183 kg (190 lb)  10/21/14 86.864 kg (191 lb 8 oz)    General:  Appears calm and comfortable Eyes:  PERRL, normal lids, irises & conjunctiva ENT:  grossly normal hearing, lips & tongue Neck:  no LAD, masses or thyromegaly Cardiovascular:  RRR, no m/r/g. No LE edema. Telemetry:  SR, no arrhythmias  Respiratory:  CTA bilaterally, no w/r/r. Normal respiratory effort. Abdomen:  soft, ntnd Skin:  no rash or induration seen on limited exam Musculoskeletal:  Intermittent bilateral CVA tenderness. Of note when patient is distracted by conversation or other exam techniques she exhibits no tenderness. Psychiatric:  grossly normal mood and affect, speech fluent and appropriate Neurologic:  grossly non-focal.          Labs on Admission:  Basic Metabolic Panel:  Recent Labs Lab 01/11/15 1811  NA 137  K 4.3  CL 101  CO2 25  GLUCOSE 130*  BUN 17  CREATININE 1.10*  CALCIUM 10.0   Liver Function Tests: No results for input(s): AST, ALT, ALKPHOS, BILITOT, PROT, ALBUMIN in the last 168 hours. No results for input(s): LIPASE, AMYLASE in the last 168 hours. No results for input(s): AMMONIA in the last 168 hours. CBC:  Recent Labs Lab 01/11/15 1811  WBC 8.9  NEUTROABS 5.2  HGB 11.1*  HCT 34.8*  MCV 86.6  PLT 390   Cardiac Enzymes: No results for input(s): CKTOTAL, CKMB, CKMBINDEX, TROPONINI in the last 168 hours.  BNP (last 3 results) No results for input(s): BNP in the last 8760 hours.  ProBNP (last 3 results) No results for input(s): PROBNP in the last 8760 hours.  CBG: No results for input(s): GLUCAP in the last 168 hours.  Radiological Exams on Admission: Ct Renal Stone Study  01/11/2015  CLINICAL DATA:  Chronic bilateral flank pain and lower back pain, radiating down into both legs. Initial encounter.  EXAM: CT ABDOMEN AND PELVIS  WITHOUT CONTRAST  TECHNIQUE: Multidetector CT imaging of the abdomen and pelvis was performed following the standard protocol without IV contrast.  COMPARISON:  CT of the abdomen and pelvis from 05/18/2014  FINDINGS: Mild right basilar atelectasis is noted.  The liver and spleen are unremarkable in appearance. The gallbladder is within normal limits. The pancreas and adrenal glands are unremarkable.  Scattered bilateral nonobstructing renal stones are seen, measuring up to 1.4 cm in size. 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 Mild left renal scarring is noted. There is no evidence of hydronephrosis. No obstructing ureteral stones are identified at this time.  No free fluid is identified. The small bowel is unremarkable in appearance. The stomach is within normal limits. No acute vascular abnormalities are seen. Mild scattered calcification is noted along the abdominal aorta and its branches.  The appendix is normal in caliber, without evidence of appendicitis. Mild diverticulosis is noted along the descending and sigmoid colon, without evidence of diverticulitis.  The bladder is mildly distended and grossly unremarkable. The patient is status post hysterectomy. No suspicious adnexal masses are seen. No inguinal lymphadenopathy is seen.  No acute osseous abnormalities are identified.  IMPRESSION: 1. No acute abnormality seen to explain the patient's symptoms. 2. Nonobstructing bilateral renal stones seen, measuring up to 1.4 cm in size. 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 Mild left renal scarring noted. 3. Mild diverticulosis along the descending and sigmoid colon, without evidence of diverticulitis. 4. Mild right basilar atelectasis noted. 5. Mild scattered calcification along the abdominal aorta and its branches.   Electronically Signed   By: Garald Balding M.D.   On: 01/11/2015 18:59      Assessment/Plan Active Problems:   Anxiety state   RENAL CALCULUS   Dysuria   CKD (chronic kidney disease), stage II   Dysuria: Possible UTI. Urine sample contaminated but shows possible signs of infection. Questionable CVA tenderness. No true evidence of pyelonephritis given patient is afebrile and WBC is normal. CT scan without evidence of acute infectious or inflammatory process. Stable bilateral nephrolithiasis. Patient is at baseline dehydrated given the fact that she only drinks approximately 4-5, 8 ounce drinks per day. Patient sees urology here in [place] on Thursdays. Recommend patient receive a 1 L normal saline bolus as she is likely dehydrated and started on ciprofloxacin or Keflex for treatment of a UTI. Would recommend Pyridium to specifically treat dysuria. Pain medications can be given at the discretion of the ED physician. Patient follow-up with urology here in [place] on Thursday. Patient to call tomorrow to establish appointment.   No other medication changes recommended.  Family Communication: None  Disposition Plan: Fluids, ABX, pyridium, DC home  Gladyes Kudo J, MD Family Medicine [facility] [email or web address] Password [email or web address]   -------------ADDENDUM-------------  Called to evaluate pt for hypotension. Numerous automatic blood pressure readings in the low [id number] systolic range. Patient states that she took her blood pressure medications this morning. Upon review of chart patient's blood pressure readings from office visits range anywhere from the low 100s to 1:30. I personally checked her blood pressure first by palpation at the radial artery which showed a systolic pressure of around 110. Recheck of her blood pressure using stethoscope revealed a blood pressure of 118/68. Patient states that she feels much better after receiving her IV  fluids and antibiotics. Of note patient received 2 mg of morphine as well as Norco 10/12/2023 and was sleeping when I  arrived to check on the patient. Further low blood pressures are likely due to the narcotics and patient resting. Patient is stable and safe for discharge.  Linna Darner, MD Family Medicine 01/11/2015, 9:16 PM

## 2015-01-11 NOTE — Discharge Instructions (Signed)
°Emergency Department Resource Guide °1) Find a Doctor and Pay Out of Pocket °Although you won't have to find out who is covered by your insurance plan, it is a good idea to ask around and get recommendations. You will then need to call the office and see if the doctor you have chosen will accept you as a new patient and what types of options they offer for patients who are self-pay. Some doctors offer discounts or will set up payment plans for their patients who do not have insurance, but you will need to ask so you aren't surprised when you get to your appointment. ° °2) Contact Your Local Health Department °Not all health departments have doctors that can see patients for sick visits, but many do, so it is worth a call to see if yours does. If you don't know where your local health department is, you can check in your phone book. The CDC also has a tool to help you locate your state's health department, and many state websites also have listings of all of their local health departments. ° °3) Find a Walk-in Clinic °If your illness is not likely to be very severe or complicated, you may want to try a walk in clinic. These are popping up all over the country in pharmacies, drugstores, and shopping centers. They're usually staffed by nurse practitioners or physician assistants that have been trained to treat common illnesses and complaints. They're usually fairly quick and inexpensive. However, if you have serious medical issues or chronic medical problems, these are probably not your best option. ° °No Primary Care Doctor: °- Call [facility] at  832-8000 - they can help you locate a primary care doctor that  accepts your insurance, provides certain services, etc. °- Physician Referral Service- 1-800-533-3463 ° °Chronic Pain Problems: °Organization         Address  Phone   Notes  °[facility]  (336) 297-2271 Patients need to be referred by their primary care doctor.  ° °Medication  Assistance: °Organization         Address  Phone   Notes  °[place] [facility] [address] °[place], [place] [address] (336) 641-8030 --Must be a resident of [place] °-- Must have NO insurance coverage whatsoever (no Medicaid/ Medicare, etc.) °-- The pt. MUST have a primary care doctor that directs their care regularly and follows them in the community °  °[facility]  (866) 331-1348   °[employer]  (888) 892-1162   ° °Agencies that provide inexpensive medical care: °Organization         Address  Phone   Notes  °[facility]  (336) 832-8035   °[facility]    (336) 832-7272   °[facility] [address] °[place], [place] [address] (336) 832-4777   °[facility] [address], °[place] (336) 271-4999   °[facility]    (336) 373-0678   °[facility]    (336) 272-1050   °[facility] ° [address], [place] Phone:  (336) 832-4444, Fax:  (336) 832-4440 Hours of Operation:  9 am - 6 pm, M-F.  Also accepts Medicaid/Medicare and self-pay.  °[facility] ° [address], [place] Phone: (336) 832-3150, Fax: (336) 832-3151. Hours of Operation:  8:30 am - 5:30 pm, M-F.  Also accepts Medicaid and self-pay.  °[facility] [place] [address], [place] Phone: (336) 878-6027   °[facility] [address], [place], [place] (336)723-1848, Ext. 123 Mondays & Thursdays: 7-9 AM.  First 15 patients are seen on a first come, first serve basis. °  ° °Medicaid-accepting [place] Providers: ° °Organization         Address  Phone   Notes  °[facility] [address], [place] (336) 641-2100 Also accepts self-pay patients.  °[facility] [address], [place] ° (336) 856-9996   °[facility] [address], [place]  (336) 288-8857   °[facility] [address] [place] [address], [place] (336) 299-7000   °Veita Bland [address], [place]  ° (336) 373-1557 Only accepts  Access Medicaid patients after they have their name applied to their card.  ° °Self-Pay (no insurance) in [place]: ° °Organization         Address  Phone   Notes  °Sickle Cell Patients, [facility] [address], [place] (336) 832-1970   °[facility] [address], [place] (336) 832-4400   °[facility] ° [address],  (336) 992-4800   °[facility]/Dr. Osei-Bonsu ° [address] [place] [address], [place] or [address], [place] (336) 841-8500 Phone number for both [place] and [place] locations is the same.  °[facility] [address], [place] (336) 299-0000   °[facility] [address] [place] [address], [place] or [address] (336) 852-7530 °(336) 878-2260   °[facility] [address], [place] (336) 350-1642, phone; (336) 294-5005, fax Sees patients 1st and 3rd Saturday of every month.  Must not qualify for public or private insurance (i.e. Medicaid, Medicare, [place] Health Choice, Veterans' Benefits) • Household income should be no more than 200% of the poverty level •The clinic cannot treat you if you are pregnant or think you are pregnant • Sexually transmitted diseases are not treated at the clinic.  ° ° °Dental Care: °Organization         Address  Phone  Notes  °[place] [facility] [address], [place] (336) 641-6152 Accepts children up to age 21 who are enrolled in Medicaid or [place] Health Choice; pregnant women with a Medicaid card; and children who have applied for Medicaid or [place] Health Choice, but were declined, whose parents can pay a reduced fee at time of service.  °[place]  [facility] [place]  [address], [place] (336) 641-7733 Accepts children up to age 21 who are enrolled in Medicaid or [place] Health Choice; pregnant women with a Medicaid card; and children who have applied for Medicaid or [place] Health Choice, but were declined, whose parents can pay a reduced fee at time of service.  °[facility] ° [address], [place] (336) 641-4533 Patients are seen by appointment only. Walk-ins are not accepted. [facility] will see patients 18 years of age and older. °Monday - Tuesday (8am-5pm) °Most Wednesdays (8:30-5pm) °$30 per visit, cash only  °[facility] ° [address], [place] (336) 641-4533 Patients are seen by appointment only. Walk-ins are not accepted. [facility] will see patients 18 years of age and older. °One   Wednesday Evening (Monthly: Volunteer Based).  $30 per visit, cash only  °[facility]  (919) 537-3737 for adults; Children under age 4, call Graduate Pediatric Dentistry at (919) [phone]. Children aged 4-14, please call (919) [phone] to request a pediatric application. ° Dental services are provided in all areas of dental care including fillings, crowns and bridges, complete and partial dentures, implants, gum treatment, root canals, and extractions. Preventive care is also provided. Treatment is provided to both adults and children. °Patients are selected via a lottery and there is often a waiting list. °  °[facility] [address], °[place] ° (336) 763-8833 [email or web address] °  °[facility] [address], [place], [place] (336)723-1848, Ext. 123 Second and Fourth Thursday of each month, opens at 6:30 AM; Clinic ends at 9 AM.  Patients are seen on a first-come first-served basis, and a limited number are seen during each clinic.  ° °[facility] ° [address], [place], [place] (336) 723-7904    Eligibility Requirements °You must have lived in [place], or [place] counties for at least the last three months. °  You cannot be eligible for state or federal sponsored healthcare insurance, including Veterans Administration, Medicaid, or Medicare. °  You generally cannot be eligible for healthcare insurance through your employer.  °  How to apply: °Eligibility screenings are held every Tuesday and Wednesday afternoon from 1:00 pm until 4:00 pm. You do not need an appointment for the interview!  °[facility] [address], [place], [place] 336-631-2330   °[facility]  336-342-8273   °[facility]  336-703-3100   °[facility]  336-570-6415   ° °Behavioral Health Resources in the Community: °Intensive Outpatient Programs °Organization         Address  Phone  Notes  °[facility] [address], [place], [place] 336-878-6098   °[facility] [address], [place], [place] 336-832-9800   °[facility] [address], [place], [place] ° 336-882-2125   °[facility] [address],  °[place], [place] 1-800-853-5163 or 336-641-4981   °Substance Abuse Resources °Organization         Address  Phone  Notes  °[facility]  336-882-2125   °[facility]  336-784-9470   °[facility]  336-285-9073   °[facility]  336-845-3988   °Residential & Outpatient Substance Abuse Program  1-800-659-3381   °Psychological Services °Organization         Address  Phone  Notes  °[facility]  336- 832-9600   °[facility]  336- 378-7881   °[facility] [address], [place] 1-800-853-5163 or 336-641-4981   ° °Mobile Crisis Teams °Organization         Address  Phone  Notes  °[facility], Mobile Crisis Care Unit  1-877-626-1772   °Assertive °[facility] ° [address]  [place], [place] 336-834-9664   °Sharon DeEsch [address] °[place] 336-554-5454   ° °Self-Help/Support Groups °Organization         Address  Phone             Notes  °[facility] [place] - variety of support groups  336- 373-1402 Call for more information  °Narcotics Anonymous (NA), [facility] [address], °[place]  2 meetings at this location  ° °  Residential Treatment Programs Organization         Address  Phone  Notes  [facility] [address],    [place]  1-(864)750-7042   [facility]  [address] [place] [id number], [place], [place]   [place], [place] (989) [phone] Admissions: 8am-3pm M-F  [facility] [address]    [place], [place] X4321937   [facility] [address] [place], [place], [place]   [facility] [address] [place] [address],  [place], [place]   [facility] - Intensive Outpatient [address], [place], [place]   [facility]  [place], [place] 1-(415)830-5778 or 803-608-0930   [facility] [address], [place], [place] Accepts Medicaid  [facility] [address]  [place] 1-531-383-0511 Substance Abuse/Addiction Treatment   [facility]         Address  Phone  Notes  [facility]  (705)155-3339   Domenic Schwab, PhD [address] [place], [place]   8122474269 or 212-566-9683   [facility], [place] 337-651-1397   [facility] [address], [place], [place] (707)640-5494 Insurance/Medicaid/sponsorship through [facility] [address]                                    [place], [place] [phone] [place] [address], [place] [phone]    Dr. Adele Schilder  [phone]   [facility] Dept. 1) [address] [place] 2) [place] 3)  [address], [place] ([phone]) [phone]  [phone]   [place] (458)[phone] or [phone] (After Hours)      Take the prescriptions as directed. Take your usual pain medication as previously directed. Apply moist heat or ice to the area(s) of discomfort, for 15 minutes at a time, several times per day for the next few days.  Do not fall asleep on a heating or ice pack.  Call your regular Urologist tomorrow to schedule a follow up appointment in the next 2 days. Call your regular medical doctor to schedule a follow up appointment within the next 3 days.  Return to the Emergency Department immediately if worsening.

## 2015-01-11 NOTE — ED Notes (Signed)
Patient attempted to urinate, but was unable.

## 2015-01-11 NOTE — ED Notes (Signed)
In and Out cath done for u/a. Patient tolerated well.  Drained additional 150cc beyond sample.

## 2015-01-13 LAB — URINE CULTURE: Culture: NO GROWTH

## 2015-01-19 ENCOUNTER — Ambulatory Visit (INDEPENDENT_AMBULATORY_CARE_PROVIDER_SITE_OTHER): Payer: Medicare Other | Admitting: Family Medicine

## 2015-01-19 ENCOUNTER — Encounter: Payer: Self-pay | Admitting: Family Medicine

## 2015-01-19 VITALS — BP 120/74 | HR 97 | Resp 16 | Ht 66.0 in | Wt 188.0 lb

## 2015-01-19 DIAGNOSIS — D1720 Benign lipomatous neoplasm of skin and subcutaneous tissue of unspecified limb: Secondary | ICD-10-CM

## 2015-01-19 DIAGNOSIS — Z0000 Encounter for general adult medical examination without abnormal findings: Secondary | ICD-10-CM

## 2015-01-19 DIAGNOSIS — I10 Essential (primary) hypertension: Secondary | ICD-10-CM

## 2015-01-19 DIAGNOSIS — E785 Hyperlipidemia, unspecified: Secondary | ICD-10-CM | POA: Diagnosis not present

## 2015-01-19 DIAGNOSIS — E1121 Type 2 diabetes mellitus with diabetic nephropathy: Secondary | ICD-10-CM

## 2015-01-19 DIAGNOSIS — Z1159 Encounter for screening for other viral diseases: Secondary | ICD-10-CM

## 2015-01-19 DIAGNOSIS — E1129 Type 2 diabetes mellitus with other diabetic kidney complication: Secondary | ICD-10-CM | POA: Diagnosis not present

## 2015-01-19 DIAGNOSIS — M5489 Other dorsalgia: Secondary | ICD-10-CM

## 2015-01-19 HISTORY — DX: Benign lipomatous neoplasm of skin and subcutaneous tissue of unspecified limb: D17.20

## 2015-01-19 MED ORDER — TRAMADOL HCL 50 MG PO TABS
ORAL_TABLET | ORAL | Status: DC
Start: 2015-01-19 — End: 2015-03-04

## 2015-01-19 NOTE — Patient Instructions (Signed)
Annual physical in January, call in September for flu vaccine  Call if you need me before  You are referred to Dr Arnoldo Morale re left arm swelling  Labs today   You will be  referred for in home help as discussed 4 hrs per week for clean, your insurance decides what they will cover, this also includes your life alert  pls start daily exercise to improve health  Thanks for choosing [facility], we consider it a privelige to serve you.

## 2015-01-19 NOTE — Assessment & Plan Note (Addendum)
Reports back pain is disabling , requests help at home for cleaning on e day per week, also requests medication on a daily basis to help symptom I explained that she may have no financial coverage for requested help but that referral will be made

## 2015-01-19 NOTE — Assessment & Plan Note (Signed)

## 2015-01-19 NOTE — Progress Notes (Signed)
Subjective:    Patient ID: Cheryl Abbott, female    DOB: 07/07/1945, 69 y.o.   MRN: [id number]  HPI  Preventive Screening-Counseling & Management   Patient present here today for a Medicare annual wellness visit. Recently treated in [place] for presumed UTI, however , record review shoes no actual infection. C/o worsening back pain, , requests inhome help as well as medication assistance C/o enlarging , and at times , painful growth on her left arm, wants it checked/ removed  Current Problems (verified)   Medications Prior to Visit Allergies (verified)   PAST HISTORY  Family History (verified)   Social History Divorced with 2 daughters, had to quit working at [employer] due to becoming disabled. Former smoker    Risk Factors  Current exercise habits: No current exercise routine   Dietary issues discussed: Encouraged to limit carbs and fried fatty foods    Cardiac risk factors: Type 2 diabetes   Depression Screen  (Note: if answer to either of the following is "Yes", a more complete depression screening is indicated)   Over the past two weeks, have you felt down, depressed or hopeless? No  Over the past two weeks, have you felt little interest or pleasure in doing things? No  Have you lost interest or pleasure in daily life? No  Do you often feel hopeless? No  Do you cry easily over simple problems? No   Activities of Daily Living  In your present state of health, do you have any difficulty performing the following activities?  Driving?: No Managing money?: No Feeding yourself?:No Getting from bed to chair?:No Climbing a flight of stairs?: a few  Preparing food and eating?:No Bathing or showering?:No Getting dressed?:No Getting to the toilet?:No Using the toilet?:No Moving around from place to place?: has a cane but rarely uses it , but reports chronic back pain limiting ability to do housework at times  Fall Risk Assessment In the past year have you fallen or had  a near fall?:No Are you currently taking any medications that make you dizziness?:No   Hearing Difficulties: No Do you often ask people to speak up or repeat themselves?:No Do you experience ringing or noises in your ears?:No Do you have difficulty understanding soft or whispered voices?:No  Cognitive Testing  Alert? Yes Normal Appearance?Yes  Oriented to person? Yes Place? Yes  Time? Yes  Displays appropriate judgment?Yes  Can read the correct time from a watch face? yes Are you having problems remembering things? Sometimes she forgets small things   Advanced Directives have been discussed with the patient?Yes, will give brochure     List the Names of Other Physician/Practitioners you currently use:  Dr Oneida Alar (GI)  Gershon Crane (opth) Dorris Carnes (cardiology)   Indicate any recent Medical Services you may have received from other than [facility] providers in the past year (date may be approximate).   Assessment:    Annual Wellness Exam   Plan:    During the course of the visit the patient was educated and counseled about appropriate screening and preventive services including:  A healthy diet is rich in fruit, vegetables and whole grains. Poultry fish, nuts and beans are a healthy choice for protein rather then red meat. A low sodium diet and drinking 64 ounces of water daily is generally recommended. Oils and sweet should be limited. Carbohydrates especially for those who are diabetic or overweight, should be limited to 30-45 gram per meal. It is important to eat on a regular schedule, at  least 3 times daily. Snacks should be primarily fruits, vegetables or nuts. It is important that you exercise regularly at least 30 minutes 5 times a week. If you develop chest pain, have severe difficulty breathing, or feel very tired, stop exercising immediately and seek medical attention  Immunization reviewed and updated. Cancer screening reviewed and updated    Patient Instructions (the written  plan) was given to the patient.  Medicare Attestation  I have personally reviewed:  The patient's medical and social history  Their use of alcohol, tobacco or illicit drugs  Their current medications and supplements  The patient's functional ability including ADLs,fall risks, home safety risks, cognitive, and hearing and visual impairment  Diet and physical activities  Evidence for depression or mood disorders  The patient's weight, height, BMI, and visual acuity have been recorded in the chart. I have made referrals, counseling, and provided education to the patient based on review of the above and I have provided the patient with a written personalized care plan for preventive services.     Review of Systems     Objective:   Physical Exam  BP 120/74 mmHg  Pulse 97  Resp 16  Ht 5\' 6"  (1.676 m)  Wt 188 lb (85.276 kg)  BMI 30.36 kg/m2  SpO2 97%   MS: Decreased ROM lumbar spine , wtih spasm. Grade 5 power and normal tone in all 4 extremities  Skin: fatty tumor on left arm, tender to palpation, max diameter approx 3 inches    Assessment & Plan:  Medicare annual wellness visit, subsequent Annual exam as documented. Counseling done  re healthy lifestyle involving commitment to 150 minutes exercise per week, heart healthy diet, and attaining healthy weight.The importance of adequate sleep also discussed. Regular seat belt use and home safety, is also discussed. Changes in health habits are decided on by the patient with goals and time frames  set for achieving them. Immunization and cancer screening needs are specifically addressed at this visit.   Back pain Reports back pain is disabling , requests help at home for cleaning on e day per week, also requests medication on a daily basis to help symptom I explained that she may have no financial coverage for requested help but that referral will be made  Lipoma of arm Tender left arm swelling x 2 month, wants further eval, refer  to gen surgery

## 2015-01-19 NOTE — Assessment & Plan Note (Addendum)
Tender left arm swelling x 2 month, wants further eval, refer to gen surgery

## 2015-01-20 LAB — LIPID PANEL
Cholesterol: 138 mg/dL (ref 125–200)
HDL: 46 mg/dL (ref >=46)
LDL Cholesterol: 61 mg/dL (ref <130)
Total CHOL/HDL Ratio: 3 Ratio (ref <=5.0)
Triglycerides: 153 mg/dL — ABNORMAL HIGH (ref <150)
VLDL: 31 mg/dL — ABNORMAL HIGH (ref <30)

## 2015-01-20 LAB — HEPATIC FUNCTION PANEL
ALT: 25 U/L (ref 6–29)
AST: 28 U/L (ref 10–35)
Albumin: 4.1 g/dL (ref 3.6–5.1)
Alkaline Phosphatase: 74 U/L (ref 33–130)
Bilirubin, Direct: 0.1 mg/dL (ref <=0.2)
Indirect Bilirubin: 0.1 mg/dL — ABNORMAL LOW (ref 0.2–1.2)
Total Bilirubin: 0.2 mg/dL (ref 0.2–1.2)
Total Protein: 8 g/dL (ref 6.1–8.1)

## 2015-01-20 LAB — HEMOGLOBIN A1C
Hgb A1c MFr Bld: 6.5 % — ABNORMAL HIGH (ref <5.7)
Mean Plasma Glucose: 140 mg/dL — ABNORMAL HIGH (ref <117)

## 2015-01-20 LAB — HEPATITIS C ANTIBODY: HCV Ab: NEGATIVE

## 2015-01-21 ENCOUNTER — Encounter: Payer: Self-pay | Admitting: Family Medicine

## 2015-02-07 ENCOUNTER — Ambulatory Visit (HOSPITAL_COMMUNITY)
Admission: RE | Admit: 2015-02-07 | Discharge: 2015-02-07 | Disposition: A | Discharge status: Home / Self Care | Payer: Medicare Other | Source: Ambulatory Visit | Attending: Urology | Admitting: Urology

## 2015-02-07 ENCOUNTER — Other Ambulatory Visit: Payer: Self-pay | Admitting: Urology

## 2015-02-07 DIAGNOSIS — N200 Calculus of kidney: Secondary | ICD-10-CM

## 2015-02-07 DIAGNOSIS — M549 Dorsalgia, unspecified: Secondary | ICD-10-CM | POA: Diagnosis not present

## 2015-02-08 ENCOUNTER — Ambulatory Visit (INDEPENDENT_AMBULATORY_CARE_PROVIDER_SITE_OTHER): Payer: Medicare Other | Admitting: Urology

## 2015-02-08 DIAGNOSIS — N200 Calculus of kidney: Secondary | ICD-10-CM

## 2015-02-14 ENCOUNTER — Other Ambulatory Visit: Payer: Self-pay | Admitting: Family Medicine

## 2015-02-15 DIAGNOSIS — D492 Neoplasm of unspecified behavior of bone, soft tissue, and skin: Secondary | ICD-10-CM | POA: Diagnosis not present

## 2015-02-16 NOTE — H&P (Signed)
  NTS SOAP Note  Vital Signs:  Vitals as of: 123XX123: Systolic Q000111Q: Diastolic 87: Heart Rate 99991111: Temp 97.37F: Height 1ft 7in: Weight 188Lbs 0 Ounces: Pain Level 3: BMI 29.44  BMI : 29.44 kg/m2  Subjective: This 69 year old female presents for of a lump in the left arm.  Is tender to touch, seems to have increased in size.   Review of Symptoms:  Constitutional:fatigue Head:unremarkable Eyes:blurred vision bilateral Nose/Mouth/Throat:unremarkable Cardiovascular:  unremarkable Respiratory:dyspnea Gastrointestinnausea, heartburn Genitourinary:urinary hesitancy joint and back pain dry skin Hematolgic/Lymphatic:unremarkable   Allergic/Immunologic:unremarkable   Past Medical History:  Reviewed  Past Medical History  Surgical History: TAH Medical Problems: NIDDM, high cholesterol, HTN Allergies: pcn, ace inhibitors, all antbiotics except cipro Medications: metformin, kcl, tramadol, lovastatin, amlodipine, paroxetine, alprazolam   Social History:Reviewed  Social History  Preferred Language: English Race:  Black or African American Ethnicity: Not Hispanic / Latino Age: 62 year Marital Status:  M Alcohol: no   Smoking Status: Former smoker reviewed on 02/15/2015 Started Date:  Stopped Date:  Functional Status reviewed on 02/15/2015 ------------------------------------------------ Bathing: Normal Cooking: Normal Dressing: Normal Driving: Normal Eating: Normal Managing Meds: Normal Oral Care: Normal Shopping: Normal Toileting: Normal Transferring: Normal Walking: Normal Cognitive Status reviewed on 02/15/2015 ------------------------------------------------ Attention: Normal Decision Making: Normal Language: Normal Memory: Normal Motor: Normal Perception: Normal Problem Solving: Normal Visual and Spatial: Normal   Family History:Reviewed  Family Health History Mother, Deceased; Diabetes mellitus, unspecified type;  Father, Deceased;  Diabetes mellitus, unspecified type;     Objective Information: General:Well appearing, well nourished in no distress. Heart:RRR, no murmur Lungs:  CTA bilaterally, no wheezes, rhonchi, rales.  Breathing unlabored. 4cm ovoid tender somewhat firm subcutaneous mass in the left arm just distal to shoulder.  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   Patient Education:Alternative treatments to surgery were discussed with patient (and family).  Risks and benefits  of procedure were fully explained to the patient (and family) who gave informed consent. Patient/family questions were addressed.  Follow-up:Pending Surgery

## 2015-02-23 DIAGNOSIS — N200 Calculus of kidney: Secondary | ICD-10-CM | POA: Diagnosis not present

## 2015-02-26 ENCOUNTER — Other Ambulatory Visit: Payer: Self-pay | Admitting: Family Medicine

## 2015-02-27 IMAGING — CR DG ABDOMEN 1V
1 series · 1 of 1 positions shown · non-contrast
Comparison: None.

CLINICAL DATA: Renal calculus, bilateral kidney pain, prior
lithotripsy

ABDOMEN - 1 VIEW

[view not recorded]
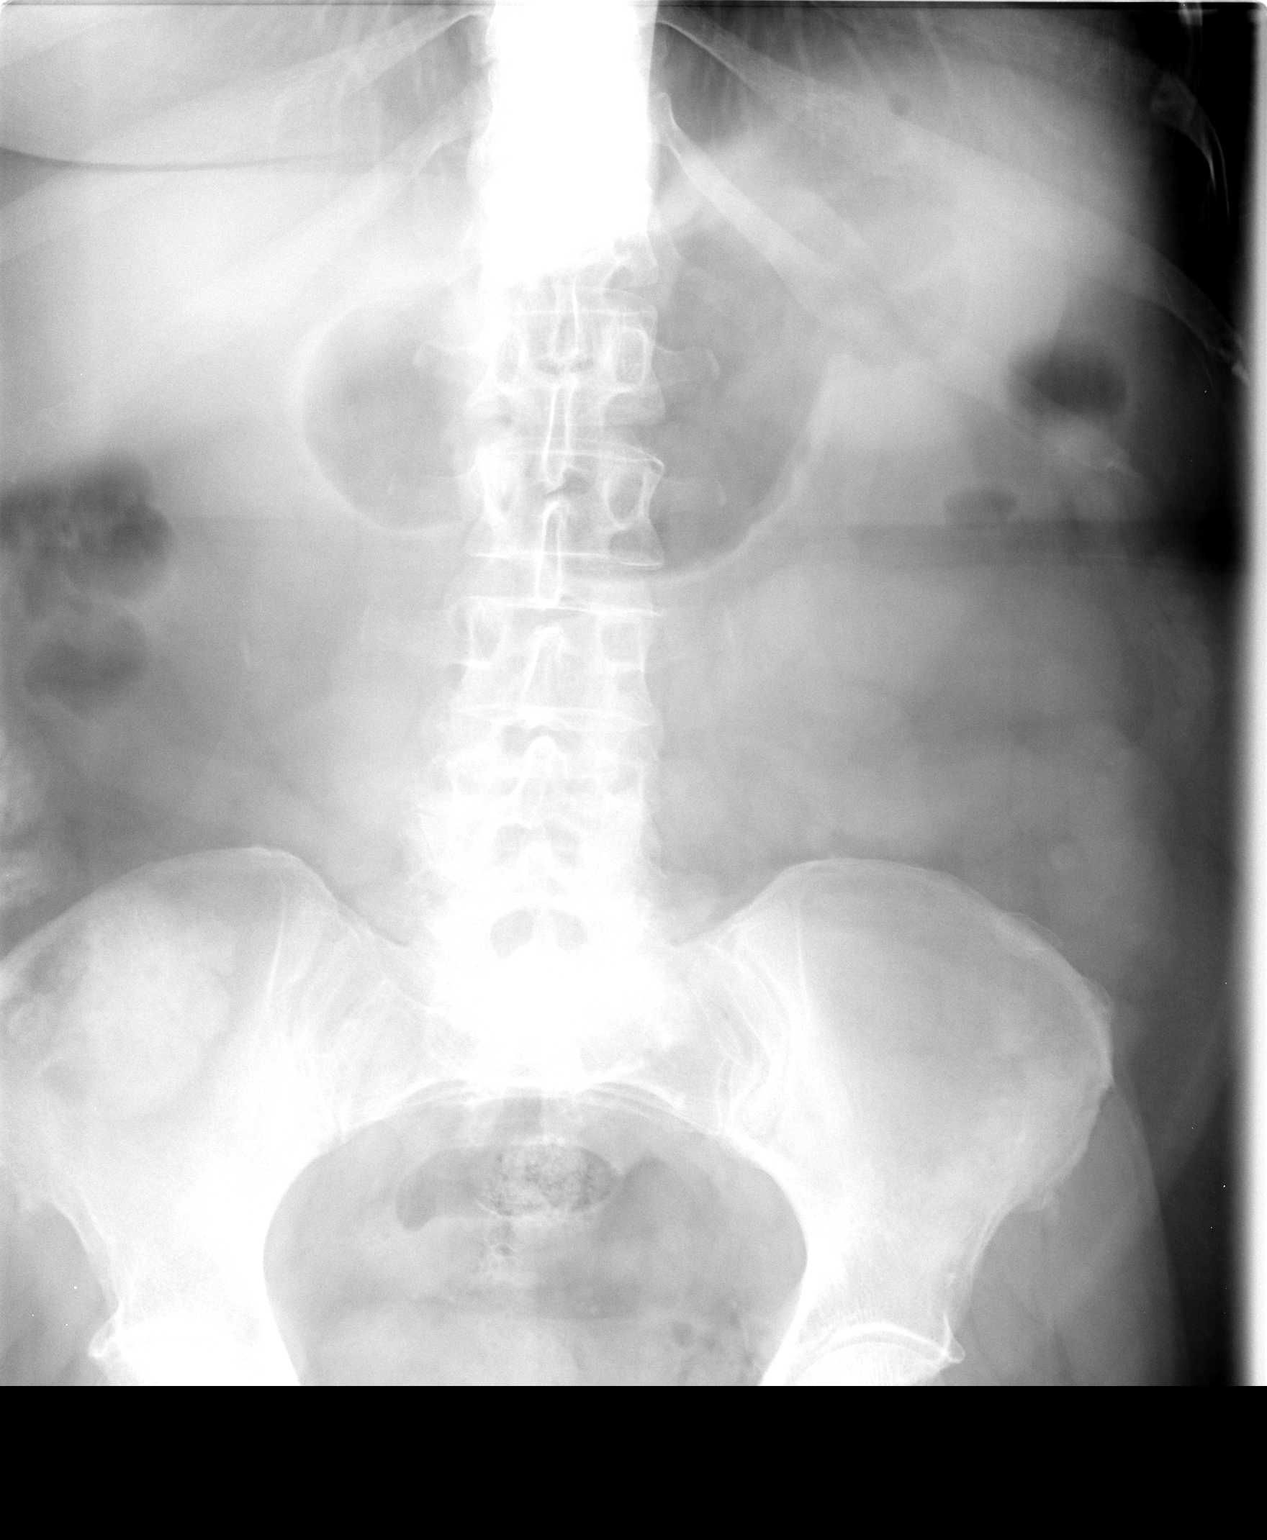

[1 of 1 positions shown; findings below may reference images not displayed]

FINDINGS: 7 mm calcification overlying the right lower kidney.

No ureteral calculus is evident.
IMPRESSION: Suspected 7 mm calculus in the right lower kidney.

## 2015-02-28 NOTE — Patient Instructions (Signed)
    Cheryl Abbott  02/28/2015     @PREFPERIOPPHARMACY @   Your procedure is scheduled on 03/04/2015.  Report to [facility] at 7:30 A.M.  Call this number if you have problems the morning of surgery:  [phone]   Remember:  Do not eat food or drink liquids after midnight.  Take these medicines the morning of surgery with A SIP OF WATER Xanax, Norvasc, Gabapentin, Protonix, Paxil, Ultram   Do not wear jewelry, make-up or nail polish.  Do not wear lotions, powders, or perfumes.  You may wear deodorant.  Do not shave 48 hours prior to surgery.  Men may shave face and neck.  Do not bring valuables to the hospital.  [facility] is not responsible for any belongings or valuables.  Contacts, dentures or bridgework may not be worn into surgery.  Leave your suitcase in the car.  After surgery it may be brought to your room.  For patients admitted to the hospital, discharge time will be determined by your treatment team.  Patients discharged the day of surgery will not be allowed to drive home.    Please read over the following fact sheets that you were given. Surgical Site Infection Prevention and Anesthesia Post-op Instructions     PATIENT INSTRUCTIONS POST-ANESTHESIA  IMMEDIATELY FOLLOWING SURGERY:  Do not drive or operate machinery for the first twenty four hours after surgery.  Do not make any important decisions for twenty four hours after surgery or while taking narcotic pain medications or sedatives.  If you develop intractable nausea and vomiting or a severe headache please notify your doctor immediately.  FOLLOW-UP:  Please make an appointment with your surgeon as instructed. You do not need to follow up with anesthesia unless specifically instructed to do so.  WOUND CARE INSTRUCTIONS (if applicable):  Keep a dry clean dressing on the anesthesia/puncture wound site if there is drainage.  Once the wound has quit draining you may leave it open to air.  Generally you should  leave the bandage intact for twenty four hours unless there is drainage.  If the epidural site drains for more than 36-48 hours please call the anesthesia department.  QUESTIONS?:  Please feel free to call your physician or the hospital operator if you have any questions, and they will be happy to assist you.

## 2015-03-01 ENCOUNTER — Encounter (HOSPITAL_COMMUNITY)
Admission: RE | Admit: 2015-03-01 | Discharge: 2015-03-01 | Disposition: A | Discharge status: Home / Self Care | Payer: Medicare Other | Source: Ambulatory Visit | Attending: General Surgery | Admitting: General Surgery

## 2015-03-01 ENCOUNTER — Encounter (HOSPITAL_COMMUNITY): Payer: Self-pay | Admitting: Emergency Medicine

## 2015-03-01 ENCOUNTER — Other Ambulatory Visit: Payer: Self-pay

## 2015-03-01 ENCOUNTER — Emergency Department (HOSPITAL_COMMUNITY)
Admission: EM | Admit: 2015-03-01 | Discharge: 2015-03-01 | Disposition: A | Discharge status: Home / Self Care | Payer: Medicare Other | Attending: Emergency Medicine | Admitting: Emergency Medicine

## 2015-03-01 DIAGNOSIS — Z862 Personal history of diseases of the blood and blood-forming organs and certain disorders involving the immune mechanism: Secondary | ICD-10-CM | POA: Diagnosis not present

## 2015-03-01 DIAGNOSIS — Z9981 Dependence on supplemental oxygen: Secondary | ICD-10-CM | POA: Diagnosis not present

## 2015-03-01 DIAGNOSIS — Z86010 Personal history of colonic polyps: Secondary | ICD-10-CM | POA: Insufficient documentation

## 2015-03-01 DIAGNOSIS — G8929 Other chronic pain: Secondary | ICD-10-CM | POA: Diagnosis not present

## 2015-03-01 DIAGNOSIS — E785 Hyperlipidemia, unspecified: Secondary | ICD-10-CM | POA: Insufficient documentation

## 2015-03-01 DIAGNOSIS — Z880 Allergy status to penicillin: Secondary | ICD-10-CM | POA: Insufficient documentation

## 2015-03-01 DIAGNOSIS — Z79899 Other long term (current) drug therapy: Secondary | ICD-10-CM | POA: Diagnosis not present

## 2015-03-01 DIAGNOSIS — I10 Essential (primary) hypertension: Secondary | ICD-10-CM | POA: Diagnosis not present

## 2015-03-01 DIAGNOSIS — R197 Diarrhea, unspecified: Secondary | ICD-10-CM

## 2015-03-01 DIAGNOSIS — G4733 Obstructive sleep apnea (adult) (pediatric): Secondary | ICD-10-CM | POA: Diagnosis not present

## 2015-03-01 DIAGNOSIS — M1990 Unspecified osteoarthritis, unspecified site: Secondary | ICD-10-CM | POA: Insufficient documentation

## 2015-03-01 DIAGNOSIS — K219 Gastro-esophageal reflux disease without esophagitis: Secondary | ICD-10-CM | POA: Diagnosis not present

## 2015-03-01 DIAGNOSIS — G629 Polyneuropathy, unspecified: Secondary | ICD-10-CM | POA: Diagnosis not present

## 2015-03-01 DIAGNOSIS — Z87891 Personal history of nicotine dependence: Secondary | ICD-10-CM | POA: Insufficient documentation

## 2015-03-01 DIAGNOSIS — F329 Major depressive disorder, single episode, unspecified: Secondary | ICD-10-CM | POA: Diagnosis not present

## 2015-03-01 DIAGNOSIS — E119 Type 2 diabetes mellitus without complications: Secondary | ICD-10-CM | POA: Diagnosis not present

## 2015-03-01 DIAGNOSIS — R531 Weakness: Secondary | ICD-10-CM | POA: Diagnosis not present

## 2015-03-01 DIAGNOSIS — Z87442 Personal history of urinary calculi: Secondary | ICD-10-CM | POA: Diagnosis not present

## 2015-03-01 DIAGNOSIS — Z7982 Long term (current) use of aspirin: Secondary | ICD-10-CM | POA: Diagnosis not present

## 2015-03-01 DIAGNOSIS — F419 Anxiety disorder, unspecified: Secondary | ICD-10-CM | POA: Diagnosis not present

## 2015-03-01 LAB — URINALYSIS, ROUTINE W REFLEX MICROSCOPIC
Bilirubin Urine: NEGATIVE
Glucose, UA: NEGATIVE mg/dL
Ketones, ur: NEGATIVE mg/dL
Leukocytes, UA: NEGATIVE
Nitrite: NEGATIVE
Specific Gravity, Urine: >1.03 — ABNORMAL HIGH (ref 1.005–1.030)
Urobilinogen, UA: 0.2 mg/dL (ref 0.0–1.0)
pH: 6 (ref 5.0–8.0)

## 2015-03-01 LAB — URINE MICROSCOPIC-ADD ON

## 2015-03-01 LAB — CBC WITH DIFFERENTIAL/PLATELET
Basophils Absolute: 0 10*3/uL (ref 0.0–0.1)
Basophils Relative: 1 %
Eosinophils Absolute: 0.2 10*3/uL (ref 0.0–0.7)
Eosinophils Relative: 3 %
HCT: 35 % — ABNORMAL LOW (ref 36.0–46.0)
Hemoglobin: 11 g/dL — ABNORMAL LOW (ref 12.0–15.0)
Lymphocytes Relative: 20 %
Lymphs Abs: 1.7 10*3/uL (ref 0.7–4.0)
MCH: 26.5 pg (ref 26.0–34.0)
MCHC: 31.4 g/dL (ref 30.0–36.0)
MCV: 84.3 fL (ref 78.0–100.0)
Monocytes Absolute: 1 10*3/uL (ref 0.1–1.0)
Monocytes Relative: 12 %
Neutro Abs: 5.6 10*3/uL (ref 1.7–7.7)
Neutrophils Relative %: 64 %
Platelets: 438 10*3/uL — ABNORMAL HIGH (ref 150–400)
RBC: 4.15 MIL/uL (ref 3.87–5.11)
RDW: 17.3 % — ABNORMAL HIGH (ref 11.5–15.5)
WBC: 8.6 10*3/uL (ref 4.0–10.5)

## 2015-03-01 LAB — COMPREHENSIVE METABOLIC PANEL WITH GFR
ALT: 19 U/L (ref 14–54)
AST: 29 U/L (ref 15–41)
Albumin: 3.9 g/dL (ref 3.5–5.0)
Alkaline Phosphatase: 65 U/L (ref 38–126)
Anion gap: 7 (ref 5–15)
BUN: 18 mg/dL (ref 6–20)
CO2: 25 mmol/L (ref 22–32)
Calcium: 10 mg/dL (ref 8.9–10.3)
Chloride: 105 mmol/L (ref 101–111)
Creatinine, Ser: 1.02 mg/dL — ABNORMAL HIGH (ref 0.44–1.00)
GFR calc Af Amer: >60 mL/min
GFR calc non Af Amer: 54 mL/min — ABNORMAL LOW
Glucose, Bld: 81 mg/dL (ref 65–99)
Potassium: 4.4 mmol/L (ref 3.5–5.1)
Sodium: 137 mmol/L (ref 135–145)
Total Bilirubin: 0.4 mg/dL (ref 0.3–1.2)
Total Protein: 8.5 g/dL — ABNORMAL HIGH (ref 6.5–8.1)

## 2015-03-01 LAB — CBG MONITORING, ED: Glucose-Capillary: 76 mg/dL (ref 65–99)

## 2015-03-01 LAB — TROPONIN I: Troponin I: <0.03 ng/mL (ref <0.031)

## 2015-03-01 MED ORDER — SODIUM CHLORIDE 0.9 % IV BOLUS (SEPSIS)
1000.0000 mL | Freq: Once | INTRAVENOUS | Status: AC
  Administered 2015-03-01: 1000 mL via INTRAVENOUS

## 2015-03-01 MED ORDER — DIPHENOXYLATE-ATROPINE 2.5-0.025 MG PO TABS: 1.0000 | ORAL_TABLET | Freq: Four times a day (QID) | ORAL | Status: DC | PRN

## 2015-03-01 MED ORDER — DIPHENOXYLATE-ATROPINE 2.5-0.025 MG PO TABS
2.0000 | ORAL_TABLET | Freq: Once | ORAL | Status: AC
  Administered 2015-03-01: 2 via ORAL
  Filled 2015-03-01: qty 2

## 2015-03-01 NOTE — Pre-Procedure Instructions (Signed)
Patient in for PAT. When to waiting room to call patient back, she stubbles. Can hardly put one foot in front of other. Gait is very off balance. Assisted x1 to door where patient can hold with one hand and be held up on oppostie side by staff member and another staff member obtains wheelchair.  Patient states she is lightheaded and has been up all night in bathroom. patient is alone and drove herself here. Dr Arnoldo Morale notified and wants her to be seen by PCP. Dr Moshe Cipro called and she wants patient to be taken to ED. Patients vitals 98.3, 101-22-94% bp 118/79. Sitting up asleep in wheelchair. Patient has a cousin that we attempted to call but no answer. Patient to ED via wheelchair accompanied by Pearson Grippe and DMcLaughlin, CNA. Report called to triage RN in ED, Anderson Malta.

## 2015-03-01 NOTE — ED Notes (Signed)
Pt c/o generally feeling unwell since last night. Pt reports malaise and diarrhea x 4 episodes since 0000 last night. Some nausea, denies vomiting. Pt was in short stay for pre-op work up for lipoma removal on Friday and c/o weakness and stumbled while attempting to walk to treatment room. Pt sent to ED by PCP for evaluation.

## 2015-03-01 NOTE — ED Provider Notes (Signed)
CSN: [id number]     Arrival date & time 03/01/15  1124 History   First MD Initiated Contact with Patient 03/01/15 1202     Chief Complaint  Patient presents with  . Diarrhea      HPI  Patient presents for evaluation of generalized weakness. She is scheduled to have a lipoma removal on Friday, 3 days. Was here for her preop check. When she was walking from check in area to the evaluation room she complained of being weak. She was brought here for evaluation. She's had diarrhea 5 episodes since midnight. No blood. No abdominal pain. No nausea or vomiting. No chest pain or difficulty breathing states she just feels generalized weakness. No new medicines or change in medications. No urinary symptoms.  No recent hospitalizations or antibiotics use.  Past Medical History  Diagnosis Date  . Anxiety disorder   . Hyperlipidemia   . Hypertension   . Complication of anesthesia     pt states she was not given enough medication with  tcs with Dr Tamala Julian                                    last [place].as able to feel and hear everything.  . Depression   . Sciatica   . Chronic back pain   . Headache   . Type 2 diabetes mellitus   . GERD (gastroesophageal reflux disease)   . Anemia   . History of adenomatous polyp of colon     2008  . Fatty liver   . Sigmoid diverticulosis   . Left ureteral stone   . Nephrolithiasis   . History of kidney stones   . Lumbar disc disease with radiculopathy   . Arthritis   . Neuropathy, peripheral   . Hypercalcemia   . OSA treated with BiPAP     per study 2007   Past Surgical History  Procedure Laterality Date  . Flexible sigmoidoscopy  09/11/2011    [id number] Internal hemorrhoids  . Esophagogastroduodenoscopy (egd) with propofol N/A 03/17/2013    Procedure: ESOPHAGOGASTRODUODENOSCOPY (EGD) WITH PROPOFOL;  Surgeon: Danie Binder, MD;  Location: [facility];  Service: Endoscopy;  Laterality: N/A;  . Savory dilation N/A 03/17/2013    Procedure: SAVORY DILATION;   Surgeon: Danie Binder, MD;  Location: [facility];  Service: Endoscopy;  Laterality: N/A;  #12.8, 14, 15, 16 dilators used  . Esophageal biopsy N/A 03/17/2013    Procedure: GASTRIC BIOPSIES;  Surgeon: Danie Binder, MD;  Location: [facility];  Service: Endoscopy;  Laterality: N/A;  . Polypectomy N/A 03/17/2013    Procedure: GASTRIC POLYPECTOMY;  Surgeon: Danie Binder, MD;  Location: [facility];  Service: Endoscopy;  Laterality: N/A;  . Colonoscopy  last one 10/02/2011    [facility], I[facility], NEXT TCS APR 2018  . Cysto/   right ureterosocopy laser lithotripsy stone extraction  10-13-2004  . Cysto/  right retrograde pyelogram/  placment right ureteral stent  01-10-2010  . Percutaneous nephrostolithotomy Bilateral 12/  2011     [facility]  . Cardiovascular stress test  01-01-2014    normal lexiscan cardiolite/  no ischemia/ infarct/  normal LV function and wall motion ,  ef 81%  . Transthoracic echocardiogram  01-01-2014    mild LVH/  ef [id number]  grade I diastolic dysfunction/  trivial MR, TR, and PR  . Cardiac catheterization  05-11-2003  dr Shelva Majestic ([facility])    Abnormal cardiolite/   Normal coronary arteries and normal LVF,  ef  63%  . Removal right thigh cyst  2006  . Vaginal hysterectomy  1970's  . Cystoscopy with retrograde pyelogram, ureteroscopy and stent placement Left 10/21/2014    Procedure: CYSTOSCOPY WITH RETROGRADE PYELOGRAM, URETEROSCOPY AND STENT PLACEMENT;  Surgeon: Franchot Gallo, MD;  Location: [facility];  Service: Urology;  Laterality: Left;  . Holmium laser application Left [id number]    Procedure: HOLMIUM LASER APPLICATION;  Surgeon: Franchot Gallo, MD;  Location: [facility];  Service: Urology;  Laterality: Left;   Family History  Problem Relation Age of Onset  . Hypertension Mother   . Diabetes Mother   . Heart failure Mother   . Dementia Mother   . Emphysema Father   . Hypertension Father   . Diabetes Brother   . GER disease  Brother   . Cancer      family history   . Diabetes      family history   . Heart defect      famiily history   . Arthritis      family history   . Anesthesia problems Neg Hx   . Hypotension Neg Hx   . Malignant hyperthermia Neg Hx   . Pseudochol deficiency Neg Hx   . Colon cancer Neg Hx   . Hypertension Sister   . Hypertension Sister    Social History  Substance Use Topics  . Smoking status: Former Smoker -- 1.00 packs/day for 20 years    Types: Cigarettes    Quit date: 09/21/1994  . Smokeless tobacco: Never Used  . Alcohol Use: No   OB History    Gravida Para Term Preterm AB TAB SAB Ectopic Multiple Living   2 2 2       2      Review of Systems  Constitutional: Negative for fever, chills, diaphoresis, appetite change and fatigue.  HENT: Negative for mouth sores, sore throat and trouble swallowing.   Eyes: Negative for visual disturbance.  Respiratory: Negative for cough, chest tightness, shortness of breath and wheezing.   Cardiovascular: Negative for chest pain.  Gastrointestinal: Positive for diarrhea. Negative for nausea, vomiting, abdominal pain and abdominal distention.  Endocrine: Negative for polydipsia, polyphagia and polyuria.  Genitourinary: Negative for dysuria, frequency and hematuria.  Musculoskeletal: Negative for gait problem.  Skin: Negative for color change, pallor and rash.  Neurological: Positive for weakness. Negative for dizziness, syncope, light-headedness and headaches.  Hematological: Does not bruise/bleed easily.  Psychiatric/Behavioral: Negative for behavioral problems and confusion.      Allergies  Ace inhibitors; Keflex; Nitrofurantoin; and Penicillins  Home Medications   Prior to Admission medications   Medication Sig Start Date End Date Taking? Authorizing Provider  ALPRAZolam Duanne Moron) 0.5 MG tablet TAKE 1 TABLET BY MOUTH TWICE A DAY 02/28/15  Yes [name], MD  amLODipine (NORVASC) 10 MG tablet Take 1 tablet (10 mg total)  by mouth daily. Patient taking differently: Take 10 mg by mouth every morning.  09/09/14  Yes [name], MD  aspirin EC 81 MG tablet Take 81 mg by mouth daily.   Yes Historical Provider, MD  diphenhydramine-acetaminophen (TYLENOL PM) 25-500 MG TABS Take 1-2 tablets by mouth at bedtime as needed (for sleep/pain).    Yes Historical Provider, MD  docusate sodium (COLACE) 100 MG capsule Take 100-200 mg by mouth daily as needed for mild constipation or moderate constipation.    Yes Historical  Provider, MD  gabapentin (NEURONTIN) 100 MG capsule TAKE 1 CAPSULE (100 MG TOTAL) BY MOUTH AT BEDTIME. 02/15/15  Yes [name], MD  lovastatin (MEVACOR) 40 MG tablet Take 2 tablets (80 mg total) by mouth at bedtime. 11/29/14  Yes [name], MD  metFORMIN (GLUCOPHAGE) 500 MG tablet TAKE 2 TABLETS (1,000 MG TOTAL) BY MOUTH 2 (TWO) TIMES DAILY WITH A MEAL. 09/13/14  Yes [name], MD  pantoprazole (PROTONIX) 40 MG tablet Take 1 tablet (40 mg total) by mouth 2 (two) times daily. 08/01/14  Yes Carlis Stable, NP  PARoxetine (PAXIL) 40 MG tablet Take 1 tablet (40 mg total) by mouth every morning. 09/09/14  Yes [name], MD  potassium citrate (UROCIT-K) 10 MEQ (1080 MG) SR tablet TAKE 1 TABLET 3 TIMES DAILY WITH MEALS. Patient taking differently: Take 10 mEq by mouth 3 (three) times daily with meals. TAKE 1 TABLET 3 TIMES DAILY WITH MEALS. 07/27/14  Yes [name], MD  traMADol Veatrice Bourbon) 50 MG tablet One at bedtime 01/19/15  Yes [name], MD  Vitamin D, Ergocalciferol, (DRISDOL) 50000 UNITS CAPS capsule TAKE 1 CAPSULE (50,000 UNITS TOTAL) BY MOUTH EVERY 7 (SEVEN) DAYS. Patient taking differently: Take 50,000 Units by mouth every Sunday. TAKE 1 CAPSULE (50,000 UNITS TOTAL) BY MOUTH EVERY 7 (SEVEN) DAYS. 08/30/14  Yes [name], MD  diphenoxylate-atropine (LOMOTIL) 2.5-0.025 MG per tablet Take 1 tablet by mouth 4 (four) times daily as needed for diarrhea or loose  stools. 03/01/15   Tanna Furry, MD   BP 109/57 mmHg  Pulse 79  Temp(Src) 97.9 F (36.6 C)  Resp 16  Ht 5\' 7"  (1.702 m)  Wt 188 lb (85.276 kg)  BMI 29.44 kg/m2  SpO2 89% Physical Exam  Constitutional: She is oriented to person, place, and time. She appears well-developed and well-nourished. No distress.  HENT:  Head: Normocephalic.  Eyes: Conjunctivae are normal. Pupils are equal, round, and reactive to light. No scleral icterus.  Neck: Normal range of motion. Neck supple. No thyromegaly present.  Cardiovascular: Normal rate and regular rhythm.  Exam reveals no gallop and no friction rub.   No murmur heard. Pulmonary/Chest: Effort normal and breath sounds normal. No respiratory distress. She has no wheezes. She has no rales.  Abdominal: Soft. Bowel sounds are normal. She exhibits no distension. There is no tenderness. There is no rebound.  Soft benign abdomen. Normal active bowel sounds.  Musculoskeletal: Normal range of motion.  Neurological: She is alert and oriented to person, place, and time.  Skin: Skin is warm and dry. No rash noted.  Psychiatric: She has a normal mood and affect. Her behavior is normal.    ED Course  Procedures (including critical care time) Labs Review Labs Reviewed  CBC WITH DIFFERENTIAL/PLATELET - Abnormal; Notable for the following:    Hemoglobin 11.0 (*)    HCT 35.0 (*)    RDW 17.3 (*)    Platelets 438 (*)    All other components within normal limits  COMPREHENSIVE METABOLIC PANEL - Abnormal; Notable for the following:    Creatinine, Ser 1.02 (*)    Total Protein 8.5 (*)    GFR calc non Af Amer 54 (*)    All other components within normal limits  URINALYSIS, ROUTINE W REFLEX MICROSCOPIC (NOT AT [facility]) - Abnormal; Notable for the following:    Specific Gravity, Urine >1.030 (*)    Hgb urine dipstick TRACE (*)    Protein, ur TRACE (*)  All other components within normal limits  URINE MICROSCOPIC-ADD ON - Abnormal; Notable for the following:     Squamous Epithelial / LPF MANY (*)    Bacteria, UA MANY (*)    Crystals CA OXALATE CRYSTALS (*)    All other components within normal limits  TROPONIN I  CBG MONITORING, ED    Imaging Review No results found. I have personally reviewed and evaluated these images and lab results as part of my medical decision-making.   EKG Interpretation   Date/Time:  Tuesday March 01 2015 11:33:20 EDT Ventricular Rate:  94 PR Interval:  156 QRS Duration: 75 QT Interval:  328 QTC Calculation: 410 R Axis:   11 Text Interpretation:  Sinus rhythm Confirmed by Jeneen Rinks  MD, Purdy ([id number]) on  03/01/2015 1:22:25 PM      MDM   Final diagnoses:  Diarrhea    Reassuring studies. Normal urine. No additional diarrhea here. Plan is home, push fluids, symptomatic treatment, expectant management.    Tanna Furry, MD 03/01/15 [phone]

## 2015-03-01 NOTE — Discharge Instructions (Signed)
Rest and stay hydrated. Push fluids Lomotil as needed for any diarrhea more than 1-2 per day.  Diarrhea Diarrhea is frequent loose and watery bowel movements. It can cause you to feel weak and dehydrated. Dehydration can cause you to become tired and thirsty, have a dry mouth, and have decreased urination that often is dark yellow. Diarrhea is a sign of another problem, most often an infection that will not last long. In most cases, diarrhea typically lasts 2-3 days. However, it can last longer if it is a sign of something more serious. It is important to treat your diarrhea as directed by your caregiver to lessen or prevent future episodes of diarrhea. CAUSES  Some common causes include:  Gastrointestinal infections caused by viruses, bacteria, or parasites.  Food poisoning or food allergies.  Certain medicines, such as antibiotics, chemotherapy, and laxatives.  Artificial sweeteners and fructose.  Digestive disorders. HOME CARE INSTRUCTIONS  Ensure adequate fluid intake (hydration): Have 1 cup (8 oz) of fluid for each diarrhea episode. Avoid fluids that contain simple sugars or sports drinks, fruit juices, whole milk products, and sodas. Your urine should be clear or pale yellow if you are drinking enough fluids. Hydrate with an oral rehydration solution that you can purchase at pharmacies, retail stores, and online. You can prepare an oral rehydration solution at home by mixing the following ingredients together:   - tsp table salt.   tsp baking soda.   tsp salt substitute containing potassium chloride.  1  tablespoons sugar.  1 L (34 oz) of water.  Certain foods and beverages may increase the speed at which food moves through the gastrointestinal (GI) tract. These foods and beverages should be avoided and include:  Caffeinated and alcoholic beverages.  High-fiber foods, such as raw fruits and vegetables, nuts, seeds, and whole grain breads and cereals.  Foods and beverages  sweetened with sugar alcohols, such as xylitol, sorbitol, and mannitol.  Some foods may be well tolerated and may help thicken stool including:  Starchy foods, such as rice, toast, pasta, low-sugar cereal, oatmeal, grits, baked potatoes, crackers, and bagels.  Bananas.  Applesauce.  Add probiotic-rich foods to help increase healthy bacteria in the GI tract, such as yogurt and fermented milk products.  Wash your hands well after each diarrhea episode.  Only take over-the-counter or prescription medicines as directed by your caregiver.  Take a warm bath to relieve any burning or pain from frequent diarrhea episodes. SEEK IMMEDIATE MEDICAL CARE IF:   You are unable to keep fluids down.  You have persistent vomiting.  You have blood in your stool, or your stools are black and tarry.  You do not urinate in 6-8 hours, or there is only a small amount of very dark urine.  You have abdominal pain that increases or localizes.  You have weakness, dizziness, confusion, or light-headedness.  You have a severe headache.  Your diarrhea gets worse or does not get better.  You have a fever or persistent symptoms for more than 2-3 days.  You have a fever and your symptoms suddenly get worse. MAKE SURE YOU:   Understand these instructions.  Will watch your condition.  Will get help right away if you are not doing well or get worse. Document Released: 05/18/2002 Document Revised: 10/12/2013 Document Reviewed: 02/03/2012 [facility] Patient Information 2015 [place], [place]. This information is not intended to replace advice given to you by your health care provider. Make sure you discuss any questions you have with your health care  provider.

## 2015-03-01 NOTE — ED Notes (Signed)
Incorrect reading on O2 sat.

## 2015-03-02 ENCOUNTER — Encounter (HOSPITAL_COMMUNITY): Payer: Self-pay

## 2015-03-02 NOTE — Pre-Procedure Instructions (Signed)
Patient calls daysurgery to see if she is still going to be able to have her surgery on Friday 23 September. Dr Arnoldo Morale contacted and reviewed her chart and wants to proceed with surgery. Patient called and instructed on NPO after midnight except xanax, norvasc, protonix and paxil with a sip of water. Instructed her to not take any of her diabetic medicines the day of surgery. She states her children will be with her to take her home and care for her after surgery. Patient verbalizes understanding of above.

## 2015-03-04 ENCOUNTER — Encounter (HOSPITAL_COMMUNITY)
Admission: RE | Disposition: A | Discharge status: Home / Self Care | Payer: Self-pay | Source: Ambulatory Visit | Attending: General Surgery

## 2015-03-04 ENCOUNTER — Ambulatory Visit (HOSPITAL_COMMUNITY): Payer: Medicare Other | Admitting: Anesthesiology

## 2015-03-04 ENCOUNTER — Encounter (HOSPITAL_COMMUNITY): Payer: Self-pay | Admitting: *Deleted

## 2015-03-04 ENCOUNTER — Ambulatory Visit (HOSPITAL_COMMUNITY)
Admission: RE | Admit: 2015-03-04 | Discharge: 2015-03-04 | Disposition: A | Discharge status: Home / Self Care | Payer: Medicare Other | Source: Ambulatory Visit | Attending: General Surgery | Admitting: General Surgery

## 2015-03-04 DIAGNOSIS — F418 Other specified anxiety disorders: Secondary | ICD-10-CM | POA: Insufficient documentation

## 2015-03-04 DIAGNOSIS — Z79899 Other long term (current) drug therapy: Secondary | ICD-10-CM | POA: Diagnosis not present

## 2015-03-04 DIAGNOSIS — I10 Essential (primary) hypertension: Secondary | ICD-10-CM | POA: Insufficient documentation

## 2015-03-04 DIAGNOSIS — E780 Pure hypercholesterolemia: Secondary | ICD-10-CM | POA: Insufficient documentation

## 2015-03-04 DIAGNOSIS — M7989 Other specified soft tissue disorders: Secondary | ICD-10-CM | POA: Diagnosis not present

## 2015-03-04 DIAGNOSIS — D492 Neoplasm of unspecified behavior of bone, soft tissue, and skin: Secondary | ICD-10-CM | POA: Diagnosis present

## 2015-03-04 DIAGNOSIS — Z87891 Personal history of nicotine dependence: Secondary | ICD-10-CM | POA: Insufficient documentation

## 2015-03-04 DIAGNOSIS — Z7982 Long term (current) use of aspirin: Secondary | ICD-10-CM | POA: Diagnosis not present

## 2015-03-04 DIAGNOSIS — M5489 Other dorsalgia: Secondary | ICD-10-CM

## 2015-03-04 DIAGNOSIS — E119 Type 2 diabetes mellitus without complications: Secondary | ICD-10-CM | POA: Diagnosis not present

## 2015-03-04 DIAGNOSIS — D1722 Benign lipomatous neoplasm of skin and subcutaneous tissue of left arm: Secondary | ICD-10-CM | POA: Diagnosis not present

## 2015-03-04 DIAGNOSIS — K219 Gastro-esophageal reflux disease without esophagitis: Secondary | ICD-10-CM | POA: Diagnosis not present

## 2015-03-04 DIAGNOSIS — D2112 Benign neoplasm of connective and other soft tissue of left upper limb, including shoulder: Secondary | ICD-10-CM | POA: Diagnosis not present

## 2015-03-04 HISTORY — PX: MASS EXCISION: SHX2000

## 2015-03-04 LAB — GLUCOSE, CAPILLARY: Glucose-Capillary: 98 mg/dL (ref 65–99)

## 2015-03-04 SURGERY — EXCISION MASS
Anesthesia: Monitor Anesthesia Care | Laterality: Left

## 2015-03-04 MED ORDER — FENTANYL CITRATE (PF) 100 MCG/2ML IJ SOLN
INTRAMUSCULAR | Status: DC | PRN
  Administered 2015-03-04: 25 ug via INTRAVENOUS
  Administered 2015-03-04: 50 ug via INTRAVENOUS

## 2015-03-04 MED ORDER — ONDANSETRON HCL 4 MG/2ML IJ SOLN
4.0000 mg | Freq: Once | INTRAMUSCULAR | Status: AC
  Administered 2015-03-04: 4 mg via INTRAVENOUS

## 2015-03-04 MED ORDER — LACTATED RINGERS IV SOLN
INTRAVENOUS | Status: DC
  Administered 2015-03-04: 08:00:00 via INTRAVENOUS

## 2015-03-04 MED ORDER — 0.9 % SODIUM CHLORIDE (POUR BTL) OPTIME
TOPICAL | Status: DC | PRN
  Administered 2015-03-04: 1000 mL

## 2015-03-04 MED ORDER — CHLORHEXIDINE GLUCONATE 4 % EX LIQD: 1.0000 "application " | Freq: Once | CUTANEOUS | Status: DC

## 2015-03-04 MED ORDER — TRAMADOL HCL 50 MG PO TABS: ORAL_TABLET | ORAL | Status: DC

## 2015-03-04 MED ORDER — FENTANYL CITRATE (PF) 100 MCG/2ML IJ SOLN
INTRAMUSCULAR | Status: AC
  Filled 2015-03-04: qty 4

## 2015-03-04 MED ORDER — LIDOCAINE HCL (PF) 1 % IJ SOLN
INTRAMUSCULAR | Status: AC
  Filled 2015-03-04: qty 30

## 2015-03-04 MED ORDER — ONDANSETRON HCL 4 MG/2ML IJ SOLN
INTRAMUSCULAR | Status: AC
  Filled 2015-03-04: qty 2

## 2015-03-04 MED ORDER — PROPOFOL 10 MG/ML IV BOLUS
INTRAVENOUS | Status: AC
  Filled 2015-03-04: qty 20

## 2015-03-04 MED ORDER — MIDAZOLAM HCL 2 MG/2ML IJ SOLN
INTRAMUSCULAR | Status: AC
  Filled 2015-03-04: qty 2

## 2015-03-04 MED ORDER — FENTANYL CITRATE (PF) 100 MCG/2ML IJ SOLN: 25.0000 ug | INTRAMUSCULAR | Status: DC | PRN

## 2015-03-04 MED ORDER — LIDOCAINE HCL (PF) 1 % IJ SOLN
INTRAMUSCULAR | Status: DC | PRN
  Administered 2015-03-04: 5 mL

## 2015-03-04 MED ORDER — MIDAZOLAM HCL 2 MG/2ML IJ SOLN
1.0000 mg | INTRAMUSCULAR | Status: DC | PRN
  Administered 2015-03-04 (×2): 2 mg via INTRAVENOUS

## 2015-03-04 MED ORDER — PROPOFOL 500 MG/50ML IV EMUL
INTRAVENOUS | Status: DC | PRN
  Administered 2015-03-04: 40 ug/kg/min via INTRAVENOUS

## 2015-03-04 MED ORDER — PROPOFOL 10 MG/ML IV BOLUS
INTRAVENOUS | Status: DC | PRN
  Administered 2015-03-04 (×3): 10 mg via INTRAVENOUS

## 2015-03-04 MED ORDER — ONDANSETRON HCL 4 MG/2ML IJ SOLN: 4.0000 mg | Freq: Once | INTRAMUSCULAR | Status: DC | PRN

## 2015-03-04 MED ORDER — KETOROLAC TROMETHAMINE 30 MG/ML IJ SOLN
30.0000 mg | Freq: Once | INTRAMUSCULAR | Status: AC
  Administered 2015-03-04: 30 mg via INTRAVENOUS
  Filled 2015-03-04: qty 1

## 2015-03-04 MED ORDER — POVIDONE-IODINE 10 % EX OINT
TOPICAL_OINTMENT | CUTANEOUS | Status: AC
Start: 2015-03-04 — End: 2015-03-04
  Filled 2015-03-04: qty 1

## 2015-03-04 SURGICAL SUPPLY — 37 items
ADH SKN CLS APL DERMABOND .7 (GAUZE/BANDAGES/DRESSINGS) ×1
BAG HAMPER (MISCELLANEOUS) ×2 IMPLANT
CHLORAPREP W/TINT 10.5 ML (MISCELLANEOUS) ×2 IMPLANT
CLOTH BEACON ORANGE TIMEOUT ST (SAFETY) ×2 IMPLANT
COVER LIGHT HANDLE STERIS (MISCELLANEOUS) ×4 IMPLANT
DECANTER SPIKE VIAL GLASS SM (MISCELLANEOUS) ×2 IMPLANT
DERMABOND ADVANCED (GAUZE/BANDAGES/DRESSINGS) ×1
DERMABOND ADVANCED .7 DNX12 (GAUZE/BANDAGES/DRESSINGS) IMPLANT
ELECT NDL TIP 2.8 STRL (NEEDLE) IMPLANT
ELECT NEEDLE TIP 2.8 STRL (NEEDLE) IMPLANT
ELECT REM PT RETURN 9FT ADLT (ELECTROSURGICAL) ×2
ELECTRODE REM PT RTRN 9FT ADLT (ELECTROSURGICAL) ×1 IMPLANT
FORMALIN 10 PREFIL 120ML (MISCELLANEOUS) ×2 IMPLANT
GLOVE BIO SURGEON STRL SZ 6.5 (GLOVE) ×1 IMPLANT
GLOVE BIOGEL PI IND STRL 7.0 (GLOVE) IMPLANT
GLOVE BIOGEL PI IND STRL 7.5 (GLOVE) IMPLANT
GLOVE BIOGEL PI INDICATOR 7.0 (GLOVE) ×1
GLOVE BIOGEL PI INDICATOR 7.5 (GLOVE) ×1
GLOVE SURG SS PI 7.5 STRL IVOR (GLOVE) ×2 IMPLANT
GOWN STRL REUS W/ TWL XL LVL3 (GOWN DISPOSABLE) ×1 IMPLANT
GOWN STRL REUS W/TWL LRG LVL3 (GOWN DISPOSABLE) ×4 IMPLANT
GOWN STRL REUS W/TWL XL LVL3 (GOWN DISPOSABLE) ×2
KIT ROOM TURNOVER APOR (KITS) ×2 IMPLANT
LIQUID BAND (GAUZE/BANDAGES/DRESSINGS) IMPLANT
MANIFOLD NEPTUNE II (INSTRUMENTS) ×2 IMPLANT
NDL HYPO 25X1 1.5 SAFETY (NEEDLE) IMPLANT
NEEDLE HYPO 25X1 1.5 SAFETY (NEEDLE) ×2 IMPLANT
NS IRRIG 1000ML POUR BTL (IV SOLUTION) ×2 IMPLANT
PACK MINOR (CUSTOM PROCEDURE TRAY) ×1 IMPLANT
PAD ARMBOARD 7.5X6 YLW CONV (MISCELLANEOUS) ×2 IMPLANT
SET BASIN LINEN APH (SET/KITS/TRAYS/PACK) ×2 IMPLANT
SUT ETHILON 3 0 FSL (SUTURE) IMPLANT
SUT PROLENE 4 0 PS 2 18 (SUTURE) IMPLANT
SUT VIC AB 3-0 SH 27 (SUTURE) ×2
SUT VIC AB 3-0 SH 27X BRD (SUTURE) IMPLANT
SUT VIC AB 4-0 PS2 27 (SUTURE) ×1 IMPLANT
SYRINGE 10CC LL (SYRINGE) ×2 IMPLANT

## 2015-03-04 NOTE — Op Note (Signed)
Patient:  Cheryl Abbott  DOB:  05/24/1946  MRN:  [id number]   Preop Diagnosis:  Soft tissue neoplasm, left arm  Postop Diagnosis:  Same  Procedure:  Excision of soft tissue neoplasm, left arm, 4 cm  Surgeon:  Aviva Signs, M.D.  Anes:  Mac  Indications:  Patient is a 69 year old black female who presents with an enlarging soft tissue mass in the left arm. It is tender to touch. The risks and benefits of the procedure including bleeding, infection, and recurrence of the mass were fully explained to the patient, who gave informed consent.  Procedure note:  The patient was placed the supine position. After monitored anesthesia care was given, the left arm was prepped and draped using the usual sterile technique with DuraPrep. Surgical site confirmation was performed. 1% Xylocaine was used for local anesthesia.  A longitudinal incision was made over the soft tissue mass which was just inferior to the left shoulder. The dissection was taken down to the mass. It appeared to be a lipoma which was interdigitating with the deltoid muscle. The lipoma was excised without difficulty. Care was taken to avoid any injury to the muscle. The fascia was closed loosely using 3-0 Vicryl interrupted sutures. The skin was closed using a 4 Vicryl subcuticular suture. Dermabond was then applied.  All tape and needle counts were correct at the end of the procedure. The patient was transferred to PACU in stable condition.  Complications:  None  EBL:  Minimal  Specimen:  Soft tissue mass, left arm

## 2015-03-04 NOTE — Interval H&P Note (Signed)
History and Physical Interval Note:  03/04/2015 8:26 AM  Cheryl Abbott  has presented today for surgery, with the diagnosis of soft tissue neoplasm unspecified  The various methods of treatment have been discussed with the patient and family. After consideration of risks, benefits and other options for treatment, the patient has consented to  Procedure(s): EXCISION SOFT TISSUE MASS 4 CM- LEFT ARM (Left) as a surgical intervention .  The patient's history has been reviewed, patient examined, no change in status, stable for surgery.  I have reviewed the patient's chart and labs.  Questions were answered to the patient's satisfaction.     Aviva Signs A

## 2015-03-04 NOTE — Anesthesia Postprocedure Evaluation (Signed)
  Anesthesia Post-op Note  Patient: Cheryl Abbott  Procedure(s) Performed: Procedure(s): EXCISION OF SOFT TISSUE NEOPLASM LEFT ARM (Left)  Patient Location: PACU  Anesthesia Type:MAC  Level of Consciousness: awake, alert  and oriented  Airway and Oxygen Therapy: Patient Spontanous Breathing  Post-op Pain: none  Post-op Assessment: Post-op Vital signs reviewed, Patient's Cardiovascular Status Stable, Respiratory Function Stable, Patent Airway and No signs of Nausea or vomiting              Post-op Vital Signs: Reviewed and stable  Last Vitals:  Filed Vitals:   03/04/15 1055  BP: 140/97  Pulse: 83  Temp: 36.3 C  Resp: 20    Complications: No apparent anesthesia complications, late entry.

## 2015-03-04 NOTE — Transfer of Care (Signed)
Immediate Anesthesia Transfer of Care Note  Patient: Cheryl Abbott  Procedure(s) Performed: Procedure(s): EXCISION OF SOFT TISSUE NEOPLASM LEFT ARM (Left)  Patient Location: PACU  Anesthesia Type:MAC  Level of Consciousness: awake and alert   Airway & Oxygen Therapy: Patient Spontanous Breathing and Patient connected to face mask oxygen  Post-op Assessment: Report given to RN  Post vital signs: Reviewed and stable  Last Vitals:  Filed Vitals:   03/04/15 0915  BP: 142/84  Pulse:   Temp:   Resp: 21    Complications: No apparent anesthesia complications

## 2015-03-04 NOTE — Discharge Instructions (Signed)
Lipoma A lipoma is a noncancerous (benign) tumor composed of fat cells. They are usually found under the skin (subcutaneous). A lipoma may occur in any tissue of the body that contains fat. Common areas for lipomas to appear include the back, shoulders, buttocks, and thighs. Lipomas are a very common soft tissue growth. They are soft and grow slowly. Most problems caused by a lipoma depend on where it is growing. DIAGNOSIS  A lipoma can be diagnosed with a physical exam. These tumors rarely become cancerous, but radiographic studies can help determine this for certain. Studies used may include:  Computerized X-ray scans (CT or CAT scan).  Computerized magnetic scans (MRI). TREATMENT  Small lipomas that are not causing problems may be watched. If a lipoma continues to enlarge or causes problems, removal is often the best treatment. Lipomas can also be removed to improve appearance. Surgery is done to remove the fatty cells and the surrounding capsule. Most often, this is done with medicine that numbs the area (local anesthetic). The removed tissue is examined under a microscope to make sure it is not cancerous. Keep all follow-up appointments with your caregiver. SEEK MEDICAL CARE IF:   The lipoma becomes larger or hard.  The lipoma becomes painful, red, or increasingly swollen. These could be signs of infection or a more serious condition. Document Released: 05/18/2002 Document Revised: 08/20/2011 Document Reviewed: 10/28/2009 [facility] Patient Information 2015 [place], [place]. This information is not intended to replace advice given to you by your health care provider. Make sure you discuss any questions you have with your health care provider. Excision of Skin Lesions Excision of a skin lesion refers to the removal of a section of skin by making small cuts (incisions) in the skin. This is typically done to remove a cancerous growth (basal cell carcinoma, squamous cell carcinoma, or melanoma) or a  noncancerous growth (cyst). It may be done to treat or prevent cancer or infection. It may also be done to improve cosmetic appearance (removal of mole, skin tag). LET YOUR CAREGIVER KNOW ABOUT:   Allergies to food or medicine.  Medicines taken, including vitamins, herbs, eyedrops, over-the-counter medicines, and creams.  Use of steroids (by mouth or creams).  Previous problems with anesthetics or numbing medicines.  History of bleeding problems or blood clots.  History of any prostheses.  Previous surgery.  Other health problems, including diabetes and kidney problems.  Possibility of pregnancy, if this applies. RISKS AND COMPLICATIONS  Many complications can be managed. With appropriate treatment and rehabilitation, the following complications are very uncommon:  Bleeding.  Infection.  Scarring.  Recurrence of cyst or cancer.  Changes in skin sensation or appearance (discoloration, swelling).  Reaction to anesthesia.  Allergic reaction to surgical materials or ointments.  Damage to nerves, blood vessels, muscles, or other structures.  Continued pain. BEFORE THE PROCEDURE  It is important to follow your caregiver's instructions prior to your procedure to avoid complications. Steps before your procedure may include:  Physical exam, blood tests, other procedures, such as removing a small sample for examination under a microscope (biopsy).  Your caregiver may review the procedure, the anesthesia being used, and what to expect after the procedure with you. You may be asked to:  Stop taking certain medicines, such as blood thinners (including aspirin, clopidogrel, ibuprofen), for several days prior to your procedure.  Take certain medicines.  Stop smoking. It is a good idea to arrange for a ride home after surgery and to have someone to help you with  activities during recovery. PROCEDURE  There are several excision techniques. The type of excision or surgical  technique used will depend on your condition, the location of the lesion, and your overall health. After the lesion is sterilized and a local anesthetic is applied, the following may be performed: Complete surgical excision The area to be removed is marked with a pen. Using a small scalpel and scissors, the surgeon gently cuts around and under the lesion until it is completely removed. The lesion is placed in a special fluid and sent to the lab for examination. If necessary, bleeding will be controlled with a device that delivers heat. The edges of the wound are stitched together and a dressing is applied. This procedure may be performed to treat a cancerous growth or noncancerous cyst or lesion. Surgeons commonly perform an elliptical excision, to minimize scarring. Excision of a cyst The surgeon makes an incision on the cyst. The entire cyst is removed through the incision. The wound may be closed with a suture (stitch). Shave excision During shave excision, the surgeon uses a small blade or loop instrument to shave off the lesion. This may be done to remove a mole or skin tag. The wound is usually left to heal on its own without stitches. Punch excision During punch excision, the surgeon uses a small, round tool (like a cookie cutter) to cut a circle shape out of the skin. The outer edges of the skin are stitched together. This may be done to remove a mole or scar or to perform a biopsy of the lesion. Mohs micrographic surgery During Mohs micrographic surgery, layers of the lesion are removed with a scalpel or loop instrument and immediately examined under a microscope until all of the abnormal or cancerous tissue is removed. This procedure is minimally invasive and ensures the best cosmetic outcome, with removal of as little normal tissue as possible. Mohs is usually done to treat skin cancer, such as basal cell carcinoma or squamous cell carcinoma, particularly on the face and ears. Antibiotic  ointment is applied to the surgical area after each of the procedures listed above, as necessary. AFTER THE PROCEDURE  How well you heal depends on many factors. Most patients heal quite well with proper techniques and self-care. Scarring will lessen over time. HOME CARE INSTRUCTIONS   Take medicines for pain as directed.  Keep the incision area clean, dry, and protected for at least 48 hours. Change dressings as directed.  For bleeding, apply gentle but firm pressure to the wound using a folded towel for 20 minutes. Call your caregiver if bleeding does not stop.  Avoid high-impact exercise and activities until the stitches are removed or the area heals.  Follow your caregiver's instructions to minimize scarring. Avoid sun exposure until the area has healed. Scarring should lessen over time.  Follow up with your caregiver as directed. Removal of stitches within 4 to 14 days may be necessary. Finding out the results of your test Not all test results are available during your visit. If your test results are not back during the visit, make an appointment with your caregiver to find out the results. Do not assume everything is normal if you have not heard from your caregiver or the medical facility. It is important for you to follow up on all of your test results. SEEK MEDICAL CARE IF:   You or your child has an oral temperature above 102 F (38.9 C).  You develop signs of infection (chills, feeling unwell).  You notice bleeding, pain, discharge, redness, or swelling at the incision site.  You notice skin irregularities or changes in sensation. MAKE SURE YOU:   Understand these instructions.  Will watch your condition.  Will get help right away if you are not doing well or get worse. FOR MORE INFORMATION  American Academy [employer]: www.AromatherapyParty.no American Academy of Dermatology: http://jones-macias.info/ Document Released: 08/22/2009 Document Revised: 08/20/2011 Document Reviewed:  08/22/2009 [facility] Patient Information 2015 [place], [place]. This information is not intended to replace advice given to you by your health care provider. Make sure you discuss any questions you have with your health care provider.

## 2015-03-04 NOTE — Anesthesia Preprocedure Evaluation (Addendum)
Anesthesia Evaluation  Patient identified by MRN, date of birth, ID band Patient awake    Reviewed: Allergy & Precautions, NPO status , Patient's Chart, lab work & pertinent test results  History of Anesthesia Complications Negative for: history of anesthetic complications  Airway Mallampati: III  TM Distance: >3 FB     Dental  (+) Teeth Intact   Pulmonary neg pulmonary ROS, former smoker,    Pulmonary exam normal        Cardiovascular Exercise Tolerance: Poor hypertension, Pt. on medications Normal cardiovascular exam     Neuro/Psych  Headaches, Anxiety Depression  Neuromuscular disease negative neurological ROS     GI/Hepatic GERD  Medicated,  Endo/Other  diabetes, Well Controlled  Renal/GU      Musculoskeletal negative musculoskeletal ROS (+)   Abdominal Normal abdominal exam  (+)   Peds  Hematology negative hematology ROS (+)   Anesthesia Other Findings   Reproductive/Obstetrics negative OB ROS                          Anesthesia Physical Anesthesia Plan  ASA: III  Anesthesia Plan: MAC   Post-op Pain Management:    Induction: Intravenous  Airway Management Planned: Nasal Cannula  Additional Equipment:   Intra-op Plan:   Post-operative Plan:   Informed Consent: I have reviewed the patients History and Physical, chart, labs and discussed the procedure including the risks, benefits and alternatives for the proposed anesthesia with the patient or authorized representative who has indicated his/her understanding and acceptance.   Dental advisory given  Plan Discussed with: CRNA and Surgeon  Anesthesia Plan Comments:         Anesthesia Quick Evaluation

## 2015-03-07 ENCOUNTER — Encounter (HOSPITAL_COMMUNITY): Payer: Self-pay | Admitting: General Surgery

## 2015-03-12 ENCOUNTER — Other Ambulatory Visit: Payer: Self-pay | Admitting: Nurse Practitioner

## 2015-03-22 ENCOUNTER — Encounter: Payer: Self-pay | Admitting: Gastroenterology

## 2015-03-22 ENCOUNTER — Other Ambulatory Visit: Payer: Self-pay | Admitting: Family Medicine

## 2015-03-22 DIAGNOSIS — Z1231 Encounter for screening mammogram for malignant neoplasm of breast: Secondary | ICD-10-CM

## 2015-04-04 ENCOUNTER — Ambulatory Visit (HOSPITAL_COMMUNITY): Payer: Medicare Other

## 2015-04-06 IMAGING — CR DG CHEST 1V PORT
1 series · 1 of 1 positions shown · non-contrast
Comparison: 10/23/2011

CLINICAL DATA: Chest pain and shortness of breath.

PORTABLE CHEST - 1 VIEW

[view not recorded]
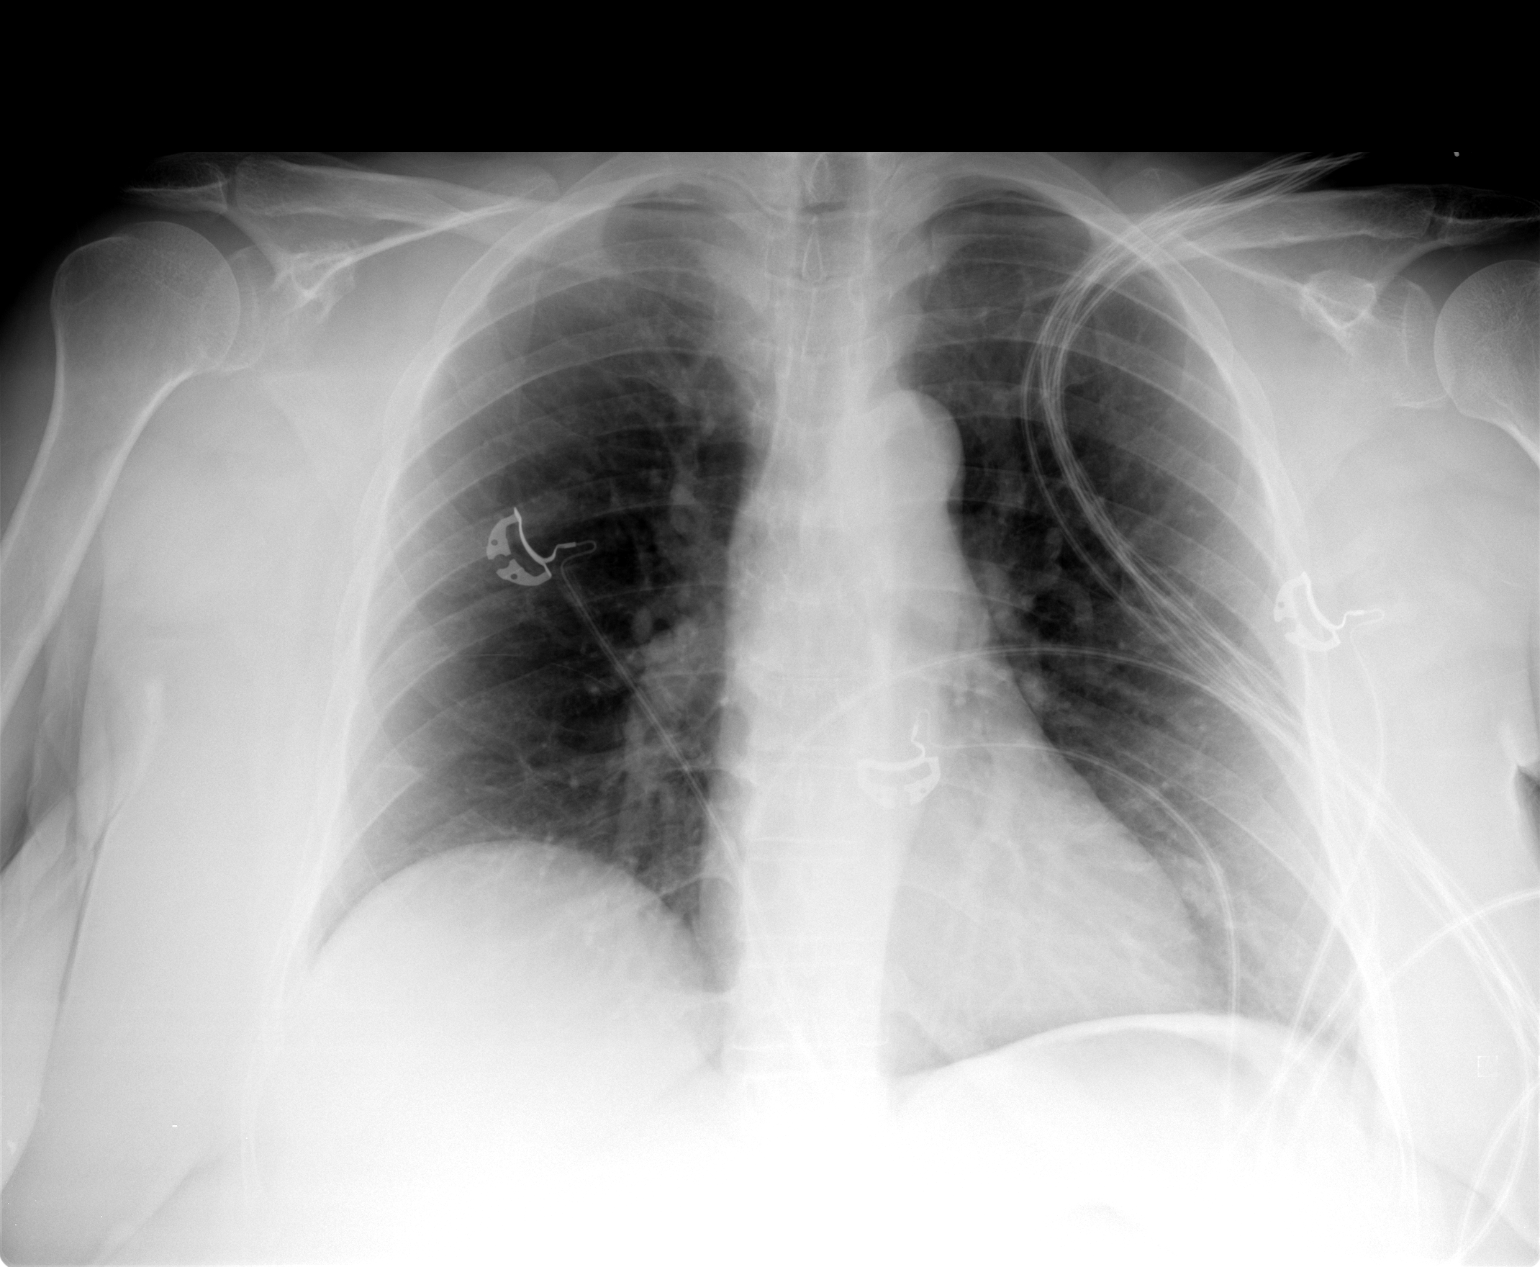

[1 of 1 positions shown; findings below may reference images not displayed]

FINDINGS: Heart size and pulmonary vascularity are normal and the
lungs are clear.  Chronic elevation of the right hemidiaphragm.  No
osseous abnormality.
IMPRESSION: No acute disease.

## 2015-04-08 ENCOUNTER — Other Ambulatory Visit: Payer: Self-pay | Admitting: Family Medicine

## 2015-04-20 ENCOUNTER — Ambulatory Visit (HOSPITAL_COMMUNITY)
Admission: RE | Admit: 2015-04-20 | Discharge: 2015-04-20 | Disposition: A | Discharge status: Home / Self Care | Payer: Medicare Other | Source: Ambulatory Visit | Attending: Family Medicine | Admitting: Family Medicine

## 2015-04-20 DIAGNOSIS — Z1231 Encounter for screening mammogram for malignant neoplasm of breast: Secondary | ICD-10-CM | POA: Diagnosis present

## 2015-05-03 IMAGING — CR DG HIP COMPLETE 2+V*R*
3 series · 3 of 3 positions shown · non-contrast
Comparison: None.

CLINICAL DATA: Low back pain, right hip pain

RIGHT HIP - COMPLETE 2+ VIEW

[view not recorded (1 of 3)]
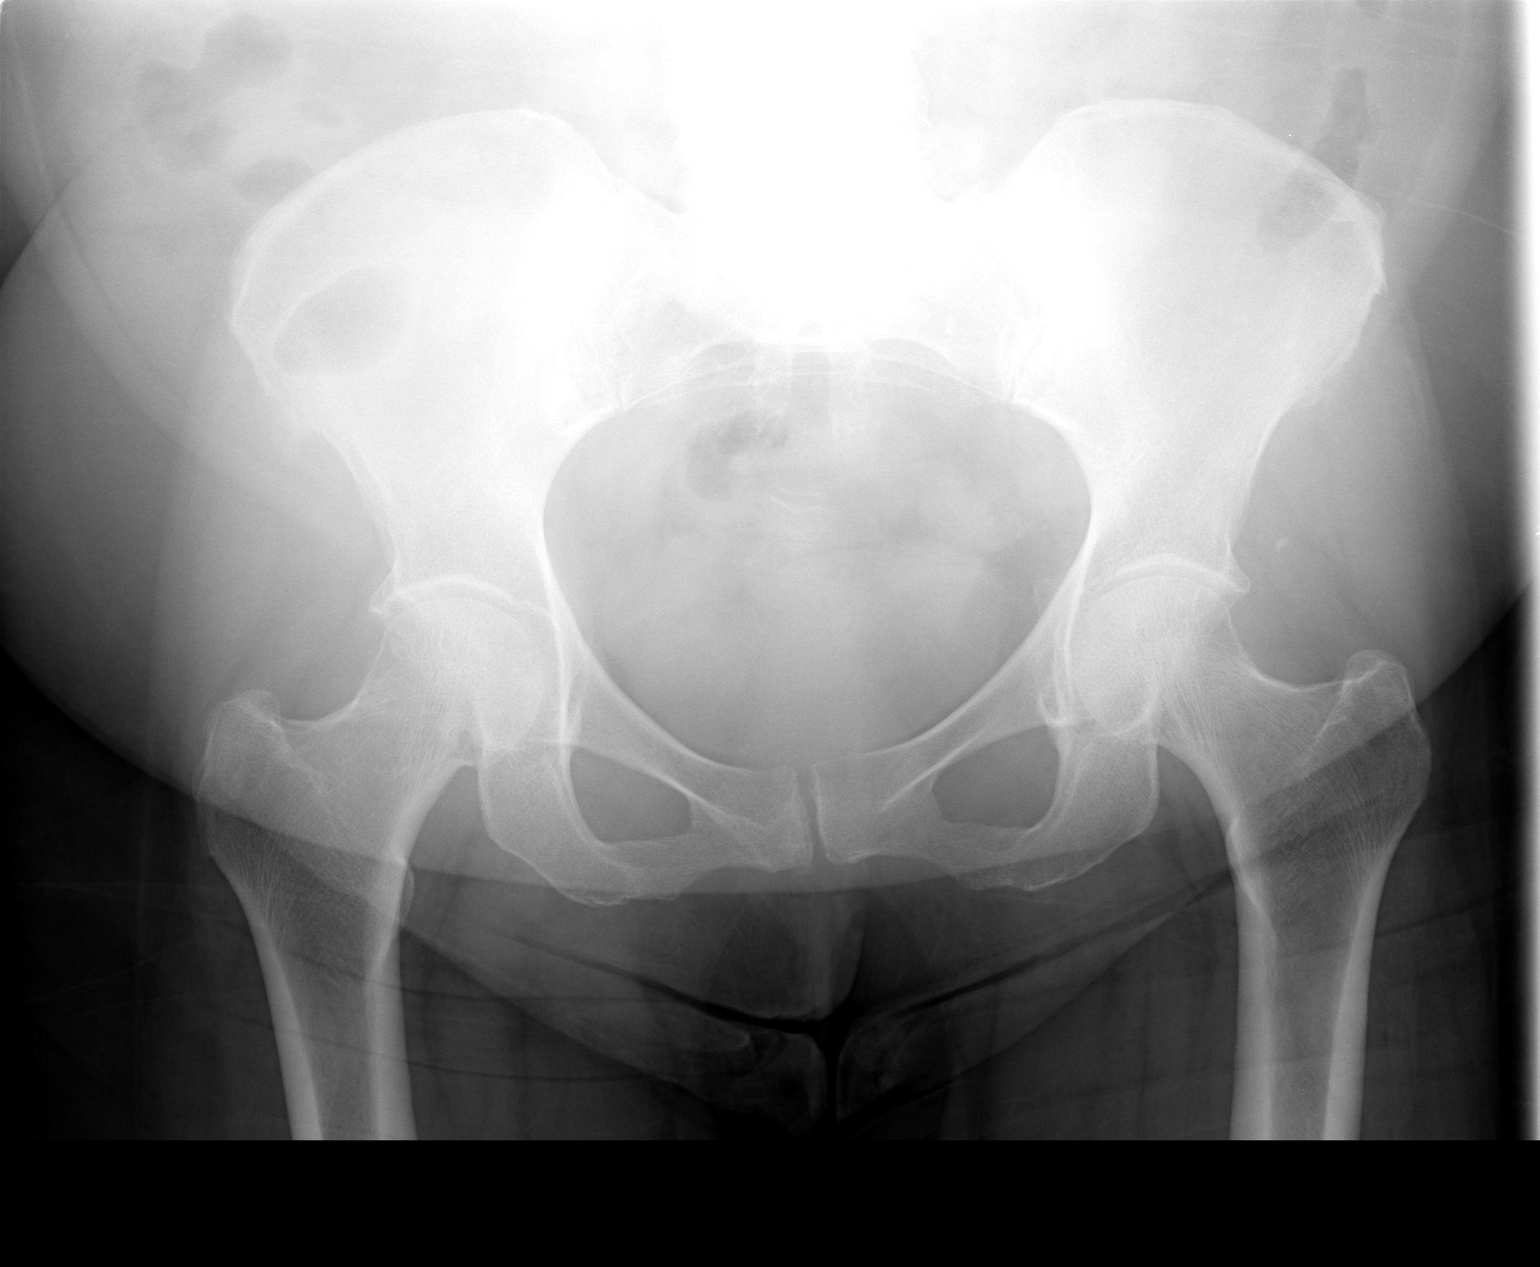

[view not recorded (2 of 3)]
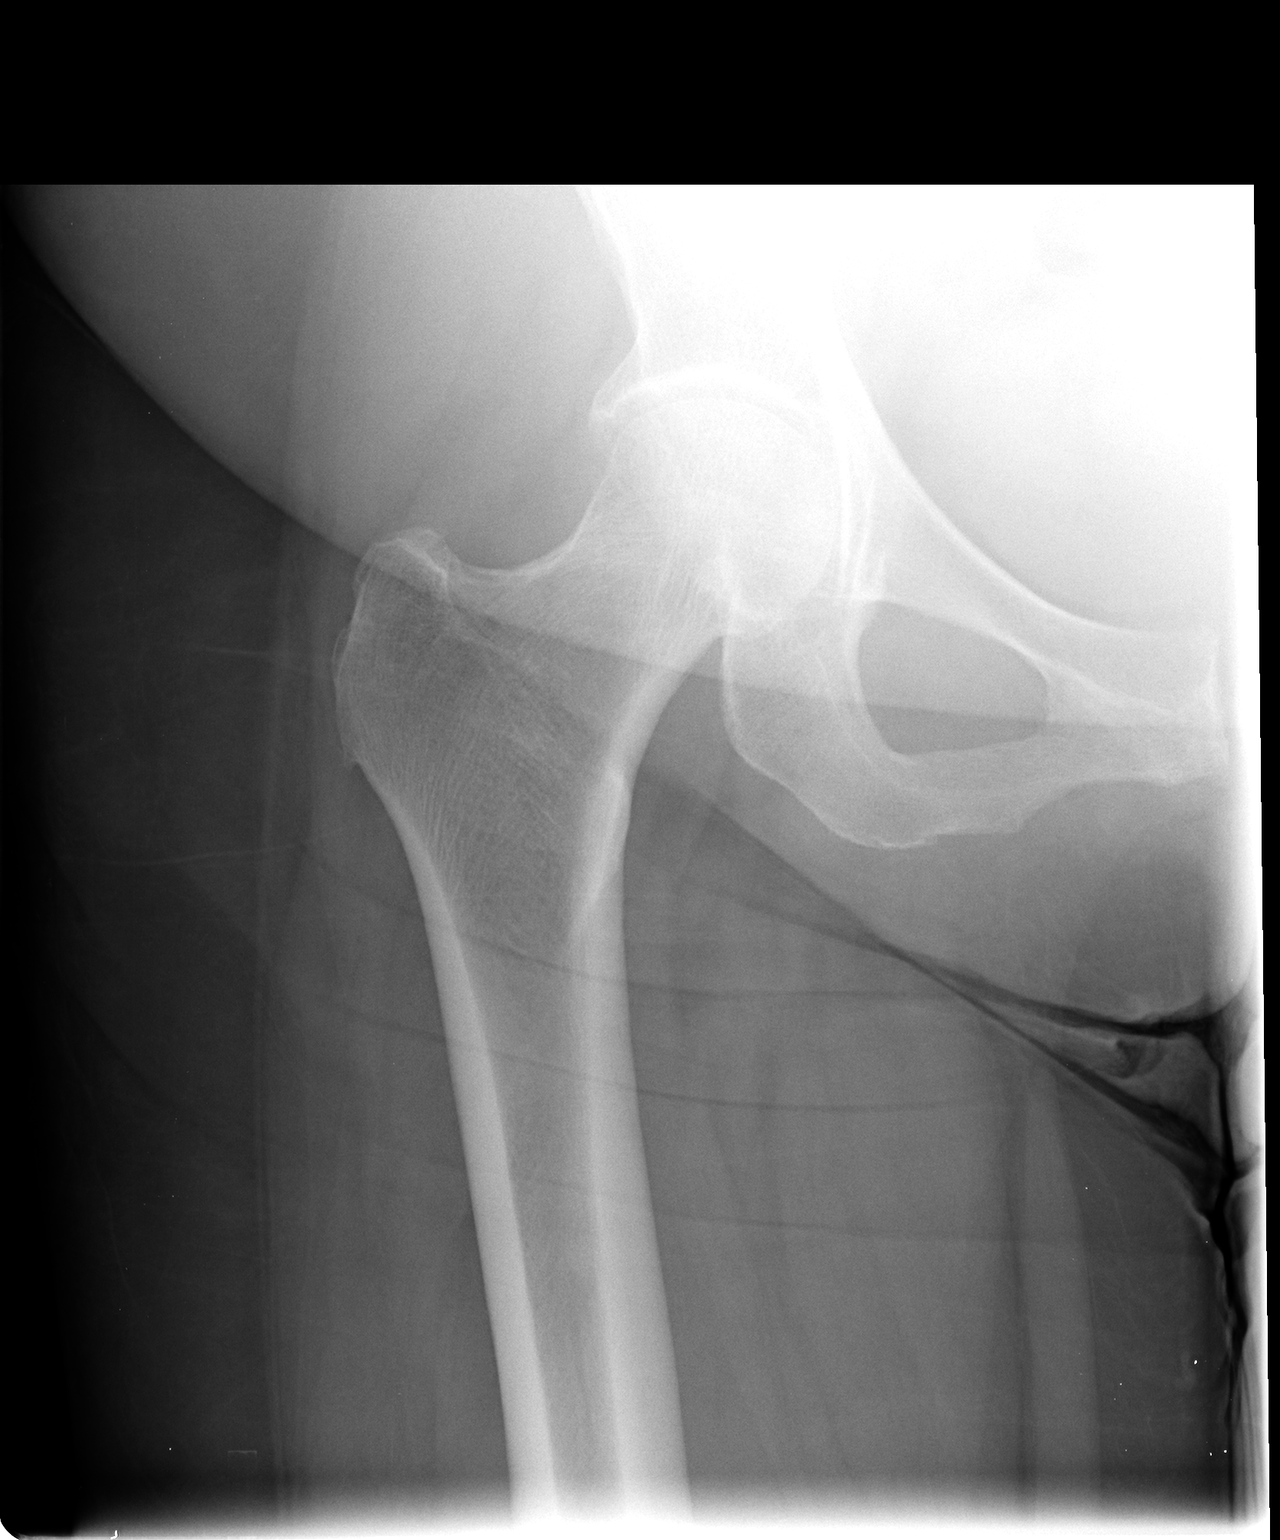

[view not recorded (3 of 3)]
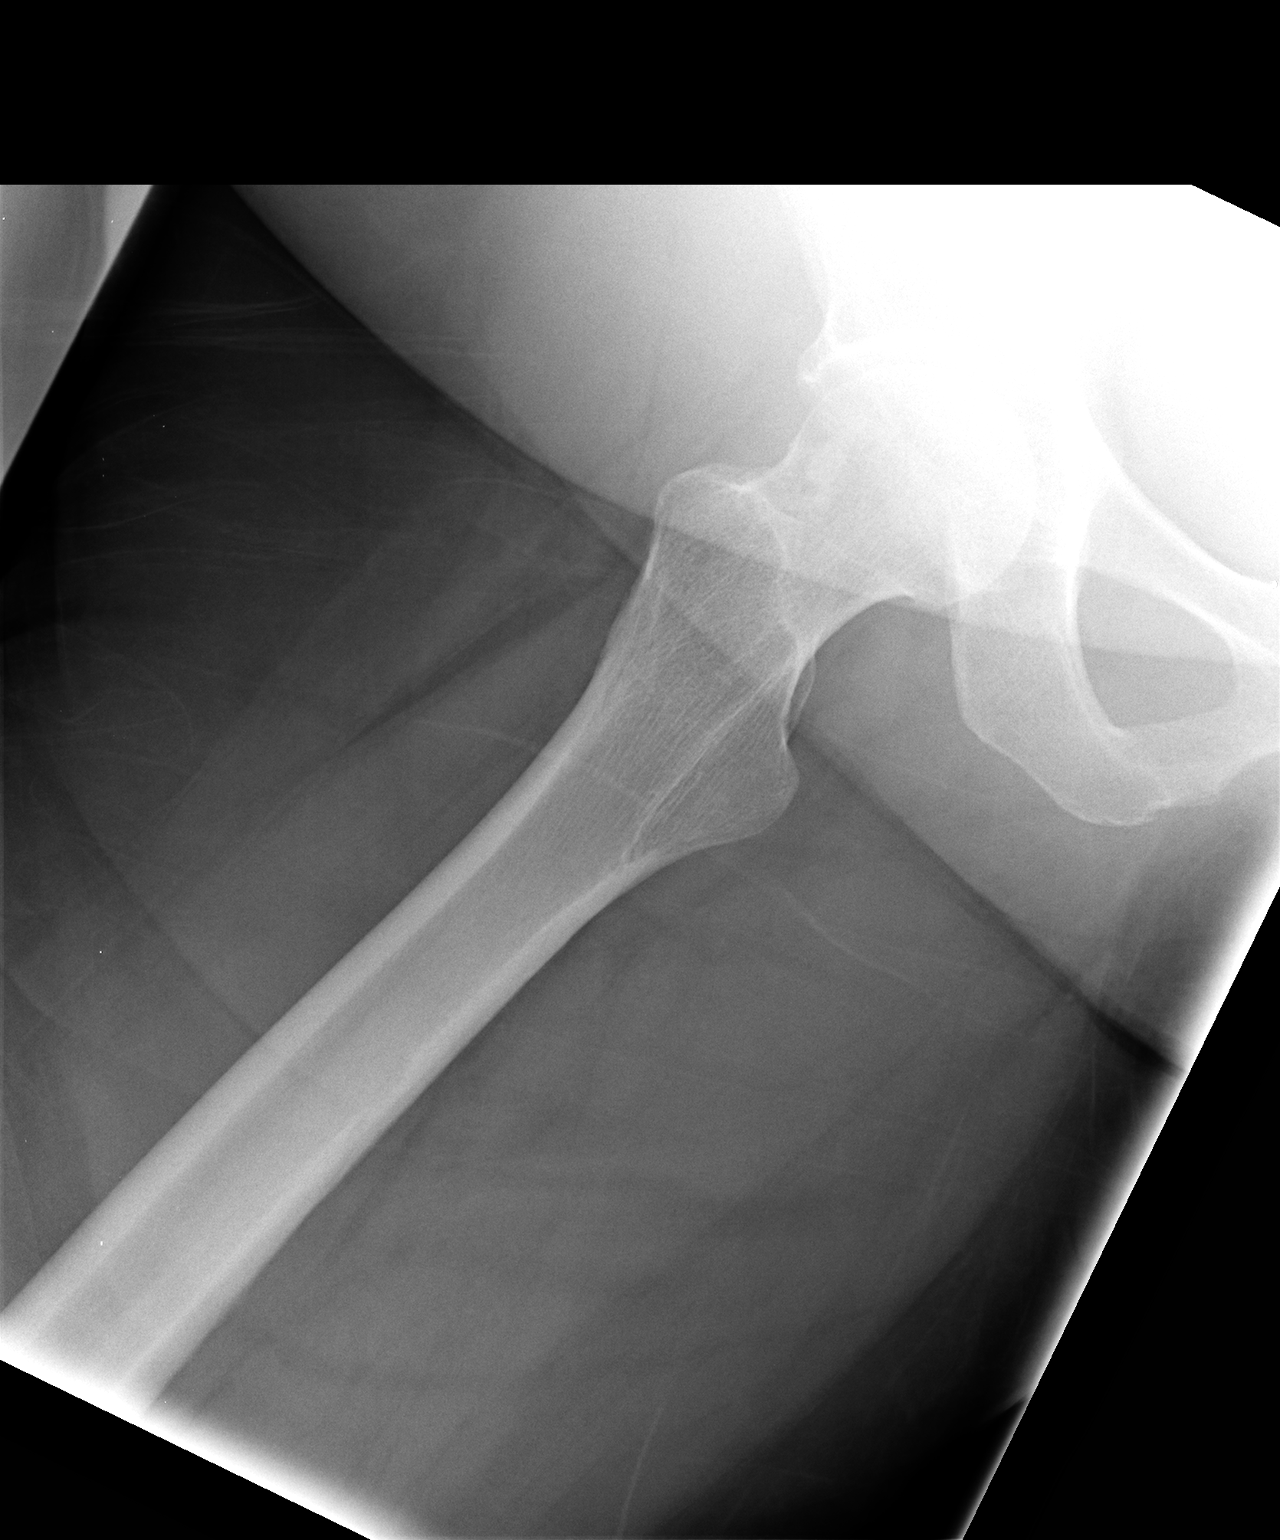

[3 of 3 positions shown; findings below may reference images not displayed]

FINDINGS: Three views of the right hip submitted.  No acute
fracture or subluxation.  No radiopaque foreign body.
IMPRESSION: No acute fracture or subluxation.

## 2015-05-03 IMAGING — CR DG LUMBAR SPINE COMPLETE 4+V
5 series · 5 of 5 positions shown · non-contrast
Comparison: None.

CLINICAL DATA: Low back pain, right hip pain

LUMBAR SPINE - COMPLETE 4+ VIEW

[view not recorded (1 of 5)]
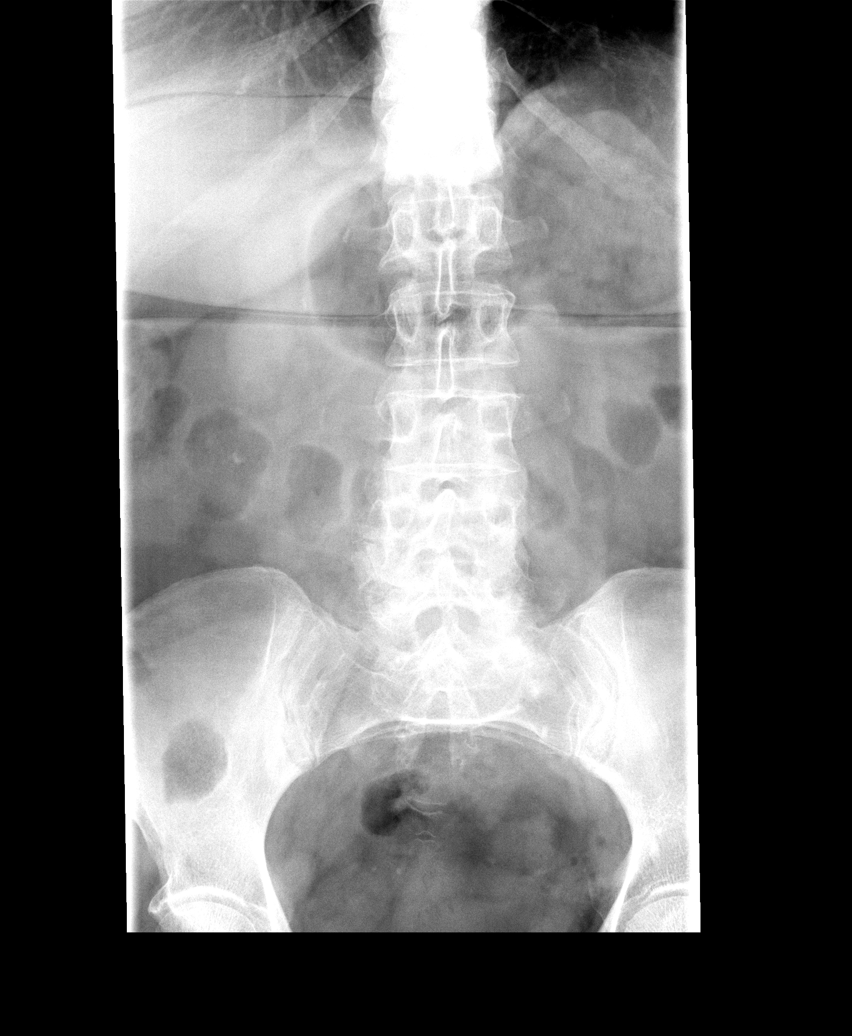

[view not recorded (2 of 5)]
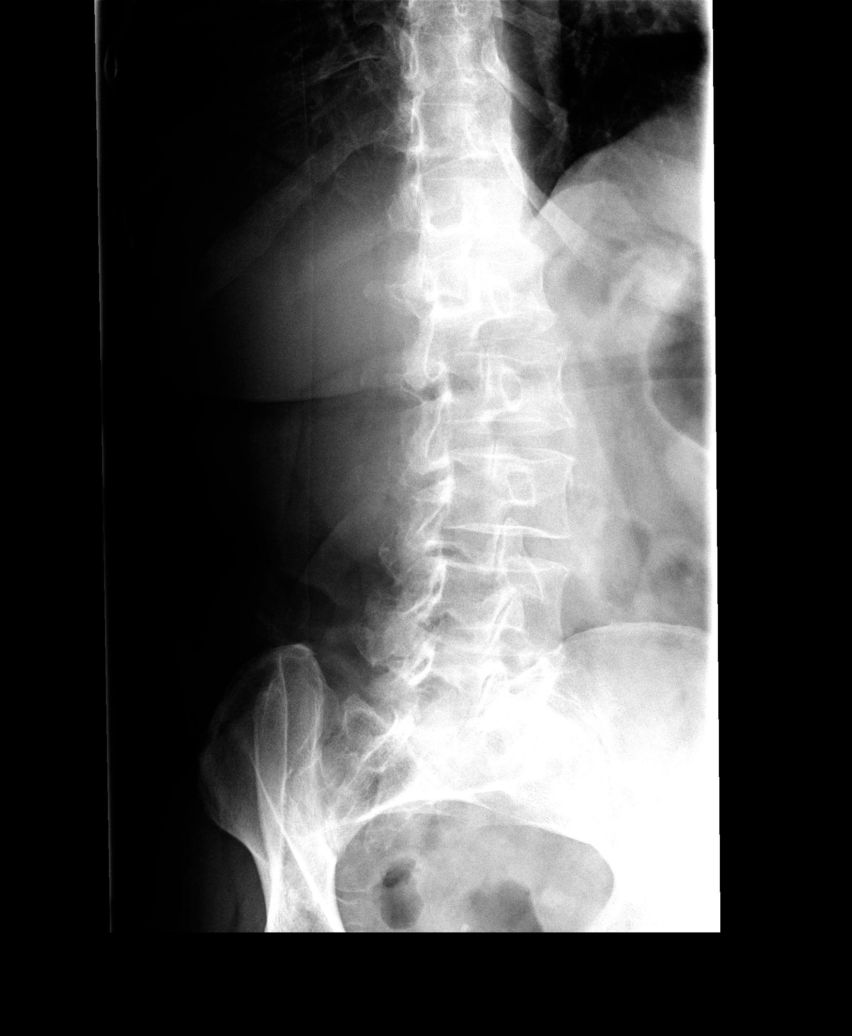

[view not recorded (3 of 5)]
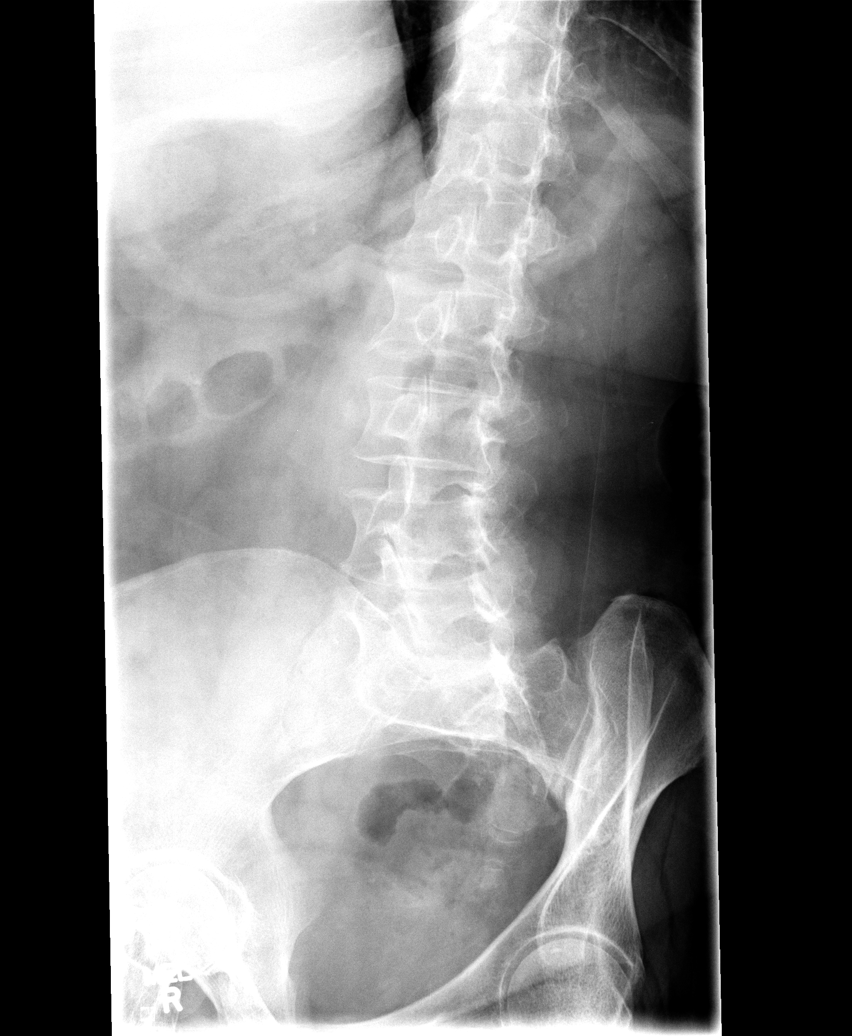

[view not recorded (4 of 5)]
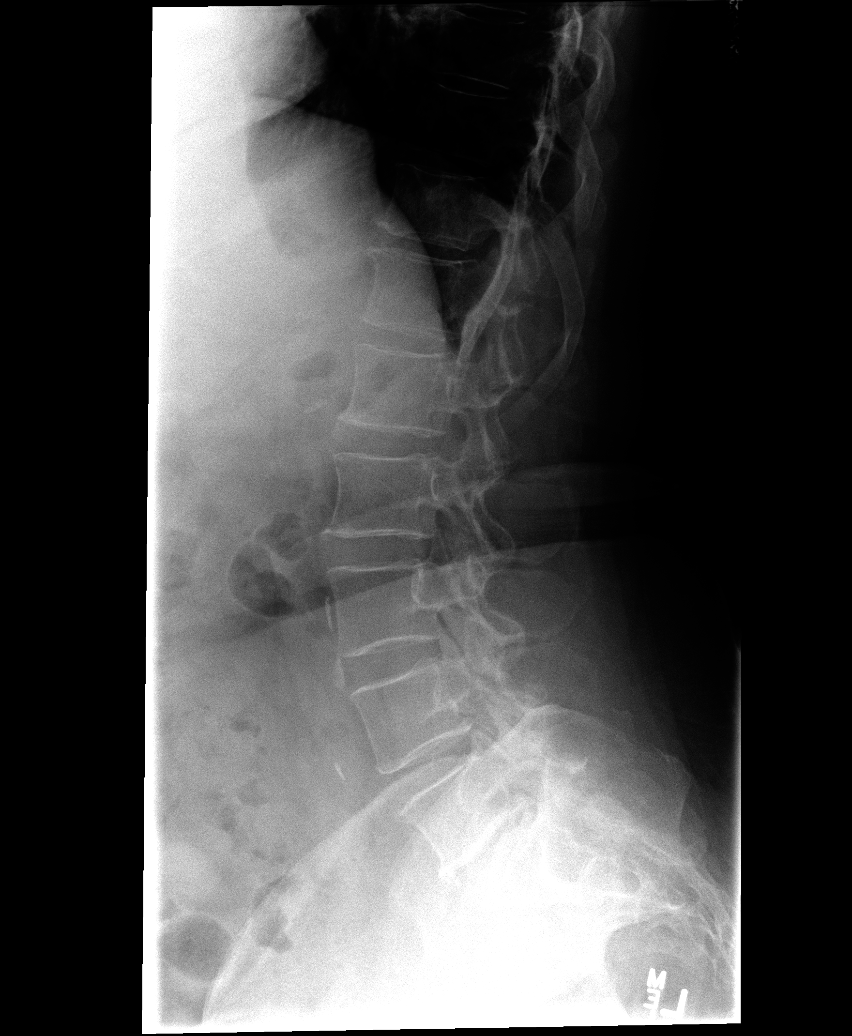

[view not recorded (5 of 5)]
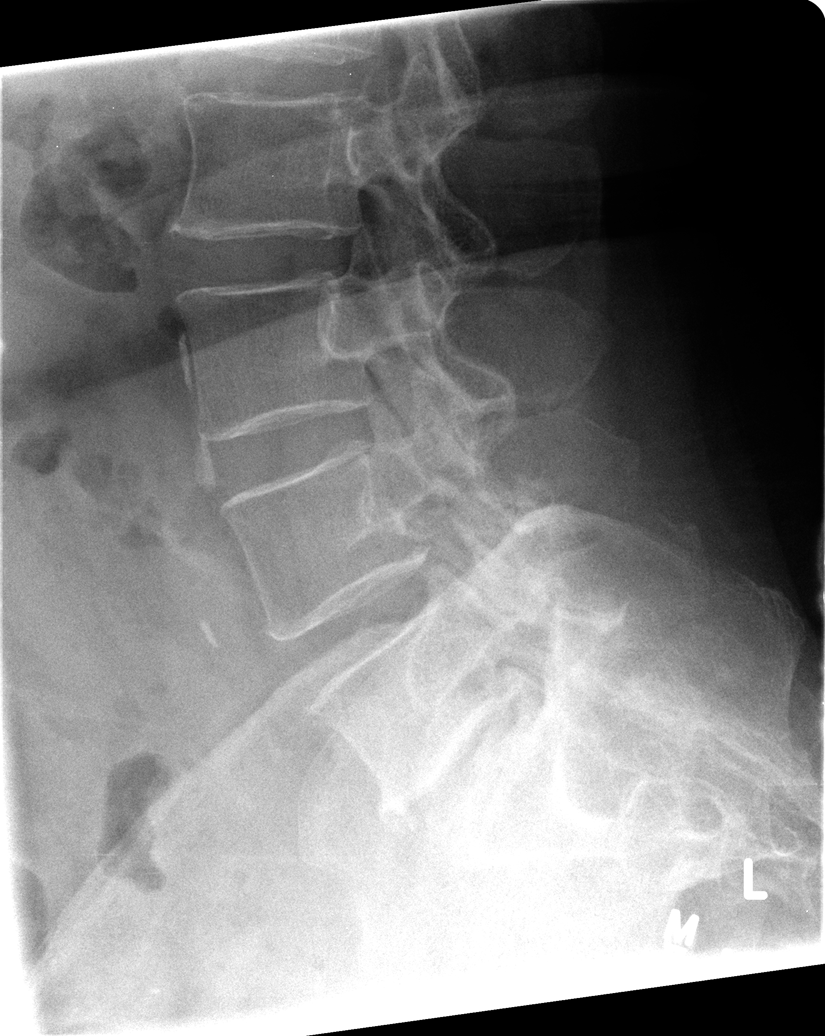

[5 of 5 positions shown; findings below may reference images not displayed]

FINDINGS: Five views of the lumbar spine submitted.  No acute
fracture or subluxation.  Mild disc space flattening at L5 S1
level.  Alignment and vertebral heights are preserved.
IMPRESSION: No acute fracture or subluxation.  Mild disc space flattening at L5-
S1 level.

## 2015-05-19 ENCOUNTER — Other Ambulatory Visit: Payer: Self-pay

## 2015-05-19 ENCOUNTER — Ambulatory Visit (INDEPENDENT_AMBULATORY_CARE_PROVIDER_SITE_OTHER): Payer: Medicare Other | Admitting: Gastroenterology

## 2015-05-19 ENCOUNTER — Encounter: Payer: Self-pay | Admitting: Gastroenterology

## 2015-05-19 VITALS — BP 130/81 | HR 106 | Temp 97.1°F | Ht 66.0 in | Wt 183.4 lb

## 2015-05-19 DIAGNOSIS — R1310 Dysphagia, unspecified: Secondary | ICD-10-CM | POA: Diagnosis not present

## 2015-05-19 DIAGNOSIS — K5901 Slow transit constipation: Secondary | ICD-10-CM | POA: Diagnosis not present

## 2015-05-19 DIAGNOSIS — R197 Diarrhea, unspecified: Secondary | ICD-10-CM | POA: Insufficient documentation

## 2015-05-19 NOTE — Patient Instructions (Addendum)
DRINK WATER TO KEEP YOUR URINE LIGHT YELLOW.  FOLLOW A SOFT MECHANICAL DIET.  MEATS SHOULD BE CHOPPED OR GROUND ONLY. DO NOT EAT CHUNKS OF ANYTHING. SEE INFO BELOW.  WE WILL GET YOU SCHEDULED FOR UPPER ENDOSCOPY WITHIN THE NEXT 2 WEEKS.  USE COLACE WITH A STIMULANT LAXATIVE  2 PILLS EVERY OTHER DAY TO PREVENT CONSTIPATION.  FOLLOW UP IN 4 MOS.    SOFT MECHANICAL DIET This SOFT MECHANICAL DIET is restricted to:  Foods that are moist, soft-textured, and easy to chew and swallow.   Meats that are ground or are minced no larger than one-quarter inch pieces. Meats are moist with gravy or sauce added.   Foods that do not include bread or bread-like textures except soft pancakes, well-moistened with syrup or sauce.   Textures with some chewing ability required.   Casseroles without rice.   Cooked vegetables that are less than half an inch in size and easily mashed with a fork. No cooked corn, peas, broccoli, cauliflower, cabbage, Brussels sprouts, asparagus, or other fibrous, non-tender or rubbery cooked vegetables.   Canned fruit except for pineapple. Fruit must be cut into pieces no larger than half an inch in size.   Foods that do not include nuts, seeds, coconut, or sticky textures.   FOOD TEXTURES FOR DYSPHAGIA DIET LEVEL 2 -SOFT MECHANICAL DIET (includes all foods on Dysphagia Diet Level 1 - Pureed, in addition to the foods listed below)  FOOD GROUP: Breads. RECOMMENDED: Soft pancakes, well-moistened with syrup or sauce.  AVOID: All others.  FOOD GROUP: Cereals.  RECOMMENDED: Cooked cereals with little texture, including oatmeal. Unprocessed wheat bran stirred into cereals for bulk. Note: If thin liquids are restricted, it is important that all of the liquid is absorbed into the cereal.  AVOID: All dry cereals and any cooked cereals that may contain flax seeds or other seeds or nuts. Whole-grain, dry, or coarse cereals. Cereals with nuts, seeds, dried fruit, and/or  coconut.  FOOD GROUP: Desserts. RECOMMENDED: Pudding, custard. Soft fruit pies with bottom crust only. Canned fruit (excluding pineapple). Soft, moist cakes with icing.Frozen malts, milk shakes, frozen yogurt, eggnog, nutritional supplements, ice cream, sherbet, regular or sugar-free gelatin, or any foods that become thin liquid at either room (70 F) or body temperature (98 F).  AVOID: Dry, coarse cakes and cookies. Anything with nuts, seeds, coconut, pineapple, or dried fruit. Breakfast yogurt with nuts. Rice or bread pudding.  FOOD GROUP: Fats. RECOMMENDED: Butter, margarine, cream for cereal (depending on liquid consistency recommendations), gravy, cream sauces, sour cream, sour cream dips with soft additives, mayonnaise, salad dressings, cream cheese, cream cheese spreads with soft additives, whipped toppings.  AVOID: All fats with coarse or chunky additives.  FOOD GROUP: Fruits. RECOMMENDED: Soft drained, canned, or cooked fruits without seeds or skin. Fresh soft and ripe banana. Fruit juices with a small amount of pulp. If thin liquids are restricted, fruit juices should be thickened to appropriate consistency.  AVOID: Fresh or frozen fruits. Cooked fruit with skin or seeds. Dried fruits. Fresh, canned, or cooked pineapple.  FOOD GROUP: Meats and Meat Substitutes. (Meat pieces should not exceed 1/4 of an inch cube and should be tender.) RECOMMENDED: Moistened ground or cooked meat, poultry, or fish. Moist ground or tender meat may be served with gravy or sauce. Casseroles without rice. Moist macaroni and cheese, well-cooked pasta with meat sauce, tuna noodle casserole, soft, moist lasagna. Moist meatballs, meatloaf, or fish loaf. Protein salads, such as tuna or egg without large chunks, celery,  or onion. Cottage cheese, smooth quiche without large chunks. Poached, scrambled, or soft-cooked eggs (egg yolks should not be "runny" but should be moist and able to be mashed with butter,  margarine, or other moisture added to them). (Cook eggs to 160 F or use pasteurized eggs for safety.) Souffls may have small, soft chunks. Tofu. Well-cooked, slightly mashed, moist legumes, such as baked beans. All meats or protein substitutes should be served with sauces or moistened to help maintain cohesiveness in the oral cavity.  AVOID: Dry meats, tough meats (such as bacon, sausage, hot dogs, bratwurst). Dry casseroles or casseroles with rice or large chunks. Peanut butter. Cheese slices and cubes. Hard-cooked or crisp fried eggs. Sandwiches.Pizza.  FOOD GROUP: Potatoes and Starches. RECOMMENDED: Well-cooked, moistened, boiled, baked, or mashed potatoes. Well-cooked shredded hash brown potatoes that are not crisp. (All potatoes need to be moist and in sauces.)Well-cooked noodles in sauce. Spaetzel or soft dumplings that have been moistened with butter or gravy.  AVOID: Potato skins and chips. Fried or French-fried potatoes. Rice.  FOOD GROUP: Soups. RECOMMENDED: Soups with easy-to-chew or easy-to-swallow meats or vegetables: Particle sizes in soups should be less than 1/2 inch. Soups will need to be thickened to appropriate consistency if soup is thinner than prescribed liquid consistency.  AVOID: Soups with large chunks of meat and vegetables. Soups with rice, corn, peas.  FOOD GROUP: Vegetables. RECOMMENDED: All soft, well-cooked vegetables. Vegetables should be less than a half inch. Should be easily mashed with a fork.  AVOID: Cooked corn and peas. Broccoli, cabbage, Brussels sprouts, asparagus, or other fibrous, non-tender or rubbery cooked vegetables.  FOOD GROUP: Miscellaneous. RECOMMENDED: Jams and preserves without seeds, jelly. Sauces, salsas, etc., that may have small tender chunks less than 1/2 inch. Soft, smooth chocolate bars that are easily chewed.  AVOID: Seeds, nuts, coconut, or sticky foods. Chewy candies such as caramels or licorice.

## 2015-05-19 NOTE — Assessment & Plan Note (Signed)
SYMPTOMS NOT IDEALLY CONTROLLED AND LIKELY IDIOPATHIC.TCS UP TO DATE. NO WARNING SIGNS/SYMPTOMS  DRINK WATER USE COLACE WITH A STIMULANT LAXATIVE  2 PILLS EVERY OTHER DAY TO PREVENT CONSTIPATION. FOLLOW UP IN 4 MOS.

## 2015-05-19 NOTE — Progress Notes (Signed)
Subjective:    Patient ID: Cheryl Abbott, female    DOB: May 24, 1946, 69 y.o.   MRN: [id number]  Tula Nakayama, MD  HPI HAVING DIFFICULTY SWALLOWING. LOST 15 LBS SINCE DEC 2015 UNINTENTIONAL. DIFFICULTY SWALLOWING OVER LAST 6 MOS. GETS STRANGLED 3-4 TIMES A WEEK. MOSTLY WITH SOLIDS AND SOMETIMES MUCOUS. NAUSEA SOME MORNINGS AND VOMITING RARELY AFTER/DURING BRUSHING TEETH. BMs: 1-2/DAY((#6 MOSTLY). RARE ACID REFLUX CHEST PAIN. RARE CONSTIPATION. IF SHE SKIPS Q3 DAYS-TAKE STOOL SOFTENER. LAST EGD/DIL OCT 2014. ALSO C/O LESIONS ON LOWER LEGS THAT ITCH. WONDERS OF SHE HAS A BLOOD CLOT IN HER LEG.  PT DENIES FEVER, CHILLS, HEMATOCHEZIA, HEMATEMESIS, melena, diarrhea, SHORTNESS OF BREATH, CHANGE IN BOWEL IN HABITS, constipation, abdominal pain, OR problems with sedation.   Past Medical History  Diagnosis Date  . Anxiety disorder   . Hyperlipidemia   . Hypertension   . Complication of anesthesia     pt states she was not given enough medication with  tcs with Dr Tamala Julian                                    last [place].as able to feel and hear everything.  . Depression   . Sciatica   . Chronic back pain   . Headache   . Type 2 diabetes mellitus ([place])   . GERD (gastroesophageal reflux disease)   . Anemia   . History of adenomatous polyp of colon     2008  . Fatty liver   . Sigmoid diverticulosis   . Left ureteral stone   . Nephrolithiasis   . History of kidney stones   . Lumbar disc disease with radiculopathy   . Arthritis   . Neuropathy, peripheral ([place])   . Hypercalcemia   . OSA treated with BiPAP     per study 2007   Past Surgical History  Procedure Laterality Date  . Flexible sigmoidoscopy  09/11/2011    [id number] Internal hemorrhoids  . Esophagogastroduodenoscopy (egd) with propofol N/A 03/17/2013    Procedure: ESOPHAGOGASTRODUODENOSCOPY (EGD) WITH PROPOFOL;  Surgeon: Danie Binder, MD;  Location: [facility];  Service: Endoscopy;  Laterality: N/A;  . Savory dilation N/A  03/17/2013    Procedure: SAVORY DILATION;  Surgeon: Danie Binder, MD;  Location: [facility];  Service: Endoscopy;  Laterality: N/A;  #12.8, 14, 15, 16 dilators used  . Esophageal biopsy N/A 03/17/2013    Procedure: GASTRIC BIOPSIES;  Surgeon: Danie Binder, MD;  Location: [facility];  Service: Endoscopy;  Laterality: N/A;  . Polypectomy N/A 03/17/2013    Procedure: GASTRIC POLYPECTOMY;  Surgeon: Danie Binder, MD;  Location: [facility];  Service: Endoscopy;  Laterality: N/A;  . Colonoscopy  last one 10/02/2011    [facility] [place] [facility], IH, NEXT TCS APR 2018  . Cysto/   right ureterosocopy laser lithotripsy stone extraction  10-13-2004  . Cysto/  right retrograde pyelogram/  placment right ureteral stent  01-10-2010  . Percutaneous nephrostolithotomy Bilateral 12/  2011     [facility]  . Cardiovascular stress test  01-01-2014    normal lexiscan cardiolite/  no ischemia/ infarct/  normal LV function and wall motion ,  ef 81%  . Transthoracic echocardiogram  01-01-2014    mild LVH/  ef [id number]  grade I diastolic dysfunction/  trivial MR, TR, and PR  . Cardiac catheterization  05-11-2003  dr Shelva Majestic ([facility])    Abnormal cardiolite/  Normal coronary arteries and normal LVF,  ef  63%  . Removal right thigh cyst  2006  . Vaginal hysterectomy  1970's  . Cystoscopy with retrograde pyelogram, ureteroscopy and stent placement Left 10/21/2014    Procedure: CYSTOSCOPY WITH RETROGRADE PYELOGRAM, URETEROSCOPY AND STENT PLACEMENT;  Surgeon: Franchot Gallo, MD;  Location: [facility];  Service: Urology;  Laterality: Left;  . Holmium laser application Left [id number]    Procedure: HOLMIUM LASER APPLICATION;  Surgeon: Franchot Gallo, MD;  Location: [facility];  Service: Urology;  Laterality: Left;  Marland Kitchen Mass excision Left 03/04/2015    Procedure: EXCISION OF SOFT TISSUE NEOPLASM LEFT ARM;  Surgeon: Aviva Signs, MD;  Location: [facility];  Service: General;  Laterality: Left;    Allergies  Allergen Reactions  . Ace Inhibitors Cough  . Keflex [Cephalexin] Diarrhea and Nausea And Vomiting  . Nitrofurantoin Diarrhea and Nausea And Vomiting  . Penicillins Hives, Rash and Other (See Comments)    Blisters on hands and feet   Current Outpatient Prescriptions  Medication Sig Dispense Refill  . ALPRAZolam (XANAX) 0.5 MG tablet TAKE 1 TABLET BY MOUTH TWICE A DAY    . amLODipine (NORVASC) 10 MG tablet Take 1 tablet (10 mg total) by mouth daily. (Patient taking differently: Take 10 mg by mouth every morning. )    . aspirin EC 81 MG tablet Take 81 mg by mouth daily.    . diphenhydramine-acetaminophen (TYLENOL PM) 25-500 MG TABS Take 1-2 tablets by mouth at bedtime as needed (for sleep/pain).     Marland Kitchen diphenoxylate-atropine (LOMOTIL) 2.5-0.025 MG per tablet Take 1 tablet by mouth 4 (four) times daily as needed for diarrhea or loose stools.    . docusate sodium (COLACE) 100 MG capsule Take 100-200 mg by mouth daily as needed for mild constipation or moderate constipation.     . gabapentin (NEURONTIN) 100 MG capsule TAKE 1 CAPSULE (100 MG TOTAL) BY MOUTH AT BEDTIME.    Marland Kitchen lovastatin (MEVACOR) 40 MG tablet Take 2 tablets (80 mg total) by mouth at bedtime.    . metFORMIN (GLUCOPHAGE) 500 MG tablet TAKE 2 TABLETS (1,000 MG TOTAL) BY MOUTH 2 (TWO) TIMES DAILY WITH A MEAL.    . pantoprazole (PROTONIX) 40 MG tablet TAKE 1 TABLET BY MOUTH TWICE A DAY    . PARoxetine (PAXIL) 40 MG tablet Take 1 tablet (40 mg total) by mouth every morning.    . potassium citrate (UROCIT-K) 10 MEQ (1080 MG) SR tablet TAKE 1 TABLET 3 TIMES DAILY WITH MEALS. (Patient taking differently: Take 10 mEq by mouth 3 (three) times daily with meals. TAKE 1 TABLET 3 TIMES DAILY WITH MEALS.)    . traMADol (ULTRAM) 50 MG tablet One at bedtime    . Vitamin D, Ergocalciferol, (DRISDOL) 50000 UNITS CAPS capsule TAKE 1 CAPSULE (50,000 UNITS TOTAL) BY MOUTH EVERY 7 (SEVEN) DAYS. (Patient taking differently: Take 50,000 Units by  mouth every Sunday. TAKE 1 CAPSULE (50,000 UNITS TOTAL) BY MOUTH EVERY 7 (SEVEN) DAYS.)     Review of Systems PER HPI OTHERWISE ALL SYSTEMS ARE NEGATIVE.    Objective:   Physical Exam  Constitutional: She is oriented to person, place, and time. She appears well-developed and well-nourished. No distress.  HENT:  Head: Normocephalic and atraumatic.  Mouth/Throat: Oropharynx is clear and moist. No oropharyngeal exudate.  Eyes: Pupils are equal, round, and reactive to light. No scleral icterus.  Neck: Normal range of motion. Neck supple.  Cardiovascular: Normal rate, regular rhythm and  normal heart sounds.   Pulmonary/Chest: Effort normal and breath sounds normal. No respiratory distress.  Abdominal: Soft. Bowel sounds are normal. She exhibits no distension. There is no tenderness.  Musculoskeletal: She exhibits no edema.  Lymphadenopathy:    She has no cervical adenopathy.  Neurological: She is alert and oriented to person, place, and time.  NO FOCAL DEFICITS  Skin:  DRKYSKIN ON LOWER LEGS WITH HYPERPIGMENTED ROUND EXCORIATED (SMALL) AREAS x2.  Psychiatric:  FLAT AFFECT, NL MOOD  Vitals reviewed.     Assessment & Plan:

## 2015-05-19 NOTE — Assessment & Plan Note (Addendum)
ASSOCIATED WITH UNINTENTIONAL WEIGHT LOSS. SYMPTOMS MOST LIKELY DUE TO: PEPTIC STRICTURE AT [facility] GASTRIC OR ESOPHAGEAL CANCER.   FOLLOW A SOFT MECHANICAL DIET.  MEATS SHOULD BE CHOPPED OR GROUND ONLY. DO NOT EAT CHUNKS OF ANYTHING. SEE INFO BELOW. UPPER ENDOSCOPY WITHIN THE NEXT 2 WEEKS. DISCUSSED PROCEDURE, BENEFITS, & RISKS: < 1% chance of medication reaction, OR bleeding. FOLLOW UP IN 4 MOS.

## 2015-05-20 NOTE — Progress Notes (Signed)
ON RECALL  °

## 2015-05-20 NOTE — Progress Notes (Signed)
CC'D TO PCP °

## 2015-05-27 ENCOUNTER — Encounter (HOSPITAL_COMMUNITY): Payer: Self-pay

## 2015-05-27 ENCOUNTER — Other Ambulatory Visit: Payer: Self-pay | Admitting: Family Medicine

## 2015-05-27 ENCOUNTER — Encounter (HOSPITAL_COMMUNITY)
Admission: RE | Admit: 2015-05-27 | Discharge: 2015-05-27 | Disposition: A | Discharge status: Home / Self Care | Payer: Medicare Other | Source: Ambulatory Visit | Attending: Gastroenterology | Admitting: Gastroenterology

## 2015-05-27 DIAGNOSIS — R1310 Dysphagia, unspecified: Secondary | ICD-10-CM | POA: Insufficient documentation

## 2015-05-27 DIAGNOSIS — Z01812 Encounter for preprocedural laboratory examination: Secondary | ICD-10-CM | POA: Insufficient documentation

## 2015-05-27 LAB — CBC WITH DIFFERENTIAL/PLATELET
Basophils Absolute: 0 10*3/uL (ref 0.0–0.1)
Basophils Relative: 0 %
Eosinophils Absolute: 0.2 10*3/uL (ref 0.0–0.7)
Eosinophils Relative: 3 %
HCT: 31.7 % — ABNORMAL LOW (ref 36.0–46.0)
Hemoglobin: 9.7 g/dL — ABNORMAL LOW (ref 12.0–15.0)
Lymphocytes Relative: 20 %
Lymphs Abs: 1.5 10*3/uL (ref 0.7–4.0)
MCH: 24.7 pg — ABNORMAL LOW (ref 26.0–34.0)
MCHC: 30.6 g/dL (ref 30.0–36.0)
MCV: 80.7 fL (ref 78.0–100.0)
Monocytes Absolute: 0.7 10*3/uL (ref 0.1–1.0)
Monocytes Relative: 9 %
Neutro Abs: 5.4 10*3/uL (ref 1.7–7.7)
Neutrophils Relative %: 68 %
Platelets: 427 10*3/uL — ABNORMAL HIGH (ref 150–400)
RBC: 3.93 MIL/uL (ref 3.87–5.11)
RDW: 17.5 % — ABNORMAL HIGH (ref 11.5–15.5)
WBC: 7.8 10*3/uL (ref 4.0–10.5)

## 2015-05-27 LAB — BASIC METABOLIC PANEL
Anion gap: 8 (ref 5–15)
BUN: 14 mg/dL (ref 6–20)
CO2: 27 mmol/L (ref 22–32)
Calcium: 9.8 mg/dL (ref 8.9–10.3)
Chloride: 102 mmol/L (ref 101–111)
Creatinine, Ser: 0.96 mg/dL (ref 0.44–1.00)
GFR calc Af Amer: >60 mL/min (ref >60)
GFR calc non Af Amer: 59 mL/min — ABNORMAL LOW (ref >60)
Glucose, Bld: 102 mg/dL — ABNORMAL HIGH (ref 65–99)
Potassium: 4.2 mmol/L (ref 3.5–5.1)
Sodium: 137 mmol/L (ref 135–145)

## 2015-05-27 NOTE — Pre-Procedure Instructions (Signed)
Patient given information to sign up for my chart at home. 

## 2015-05-27 NOTE — Patient Instructions (Signed)
Cheryl Abbott  05/27/2015     @PREFPERIOPPHARMACY @   Your procedure is scheduled on  06/10/2015  Report to [facility] at  33  A.M.  Call this number if you have problems the morning of surgery:  [phone]   Remember:  Do not eat food or drink liquids after midnight.  Take these medicines the morning of surgery with A SIP OF WATER  Xanax, amlodipine,gabapentin, protonix, paxil.   Do not wear jewelry, make-up or nail polish.  Do not wear lotions, powders, or perfumes.  You may wear deodorant.  Do not shave 48 hours prior to surgery.  Men may shave face and neck.  Do not bring valuables to the hospital.  [facility] is not responsible for any belongings or valuables.  Contacts, dentures or bridgework may not be worn into surgery.  Leave your suitcase in the car.  After surgery it may be brought to your room.  For patients admitted to the hospital, discharge time will be determined by your treatment team.  Patients discharged the day of surgery will not be allowed to drive home.   Name and phone number of your driver:   family Special instructions:   none  Please read over the following fact sheets that you were given. Pain Booklet, Coughing and Deep Breathing, Surgical Site Infection Prevention, Anesthesia Post-op Instructions and Care and Recovery After Surgery      Esophageal Dilatation Esophageal dilatation is a procedure to open a blocked or narrowed part of the esophagus. The esophagus is the long tube in your throat that carries food and liquid from your mouth to your stomach. The procedure is also called esophageal dilation.  You may need this procedure if you have a buildup of scar tissue in your esophagus that makes it difficult, painful, or even impossible to swallow. This can be caused by gastroesophageal reflux disease (GERD). In rare cases, people need this procedure because they have cancer of the esophagus or a problem with the way food moves  through the esophagus. Sometimes you may need to have another dilatation to enlarge the opening of the esophagus gradually. LET [facility] CARE PROVIDER KNOW ABOUT:   Any allergies you have.  All medicines you are taking, including vitamins, herbs, eye drops, creams, and over-the-counter medicines.  Previous problems you or members of your family have had with the use of anesthetics.  Any blood disorders you have.  Previous surgeries you have had.  Medical conditions you have.  Any antibiotic medicines you are required to take before dental procedures. RISKS AND COMPLICATIONS Generally, this is a safe procedure. However, problems can occur and include:  Bleeding from a tear in the lining of the esophagus.  A hole (perforation) in the esophagus. BEFORE THE PROCEDURE  Do not eat or drink anything after midnight on the night before the procedure or as directed by your health care provider.  Ask your health care provider about changing or stopping your regular medicines. This is especially important if you are taking diabetes medicines or blood thinners.  Plan to have someone take you home after the procedure. PROCEDURE   You will be given a medicine that makes you relaxed and sleepy (sedative).  A medicine may be sprayed or gargled to numb the back of the throat.  Your health care provider can use various instruments to do an esophageal dilatation. During the procedure, the instrument used will be placed in your mouth and  passed down into your esophagus. Options include:  Simple dilators. This instrument is carefully placed in the esophagus to stretch it.  Guided wire bougies. In this method, a flexible tube (endoscope) is used to insert a wire into the esophagus. The dilator is passed over this wire to enlarge the esophagus. Then the wire is removed.  Balloon dilators. An endoscope with a small balloon at the end is passed down into the esophagus. Inflating the balloon gently  stretches the esophagus and opens it up. AFTER THE PROCEDURE  Your blood pressure, heart rate, breathing rate, and blood oxygen level will be monitored often until the medicines you were given have worn off.  Your throat may feel slightly sore and will probably still feel numb. This will improve slowly over time.  You will not be allowed to eat or drink until the throat numbness has resolved.  If this is a same-day procedure, you may be allowed to go home once you have been able to drink, urinate, and sit on the edge of the bed without nausea or dizziness.  If this is a same-day procedure, you should have a friend or family member with you for the next 24 hours after the procedure.   This information is not intended to replace advice given to you by your health care provider. Make sure you discuss any questions you have with your health care provider.   Document Released: 07/19/2005 Document Revised: 06/18/2014 Document Reviewed: 10/07/2013 Elsevier Interactive Patient Education 2016 [employer]. Esophagogastroduodenoscopy Esophagogastroduodenoscopy (EGD) is a procedure that is used to examine the lining of the esophagus, stomach, and first part of the small intestine (duodenum). A long, flexible, lighted tube with a camera attached (endoscope) is inserted down the throat to view these organs. This procedure is done to detect problems or abnormalities, such as inflammation, bleeding, ulcers, or growths, in order to treat them. The procedure lasts 5-20 minutes. It is usually an outpatient procedure, but it may need to be performed in a hospital in emergency cases. LET [facility] CARE PROVIDER KNOW ABOUT:  Any allergies you have.  All medicines you are taking, including vitamins, herbs, eye drops, creams, and over-the-counter medicines.  Previous problems you or members of your family have had with the use of anesthetics.  Any blood disorders you have.  Previous surgeries you have  had.  Medical conditions you have. RISKS AND COMPLICATIONS Generally, this is a safe procedure. However, problems can occur and include:  Infection.  Bleeding.  Tearing (perforation) of the esophagus, stomach, or duodenum.  Difficulty breathing or not being able to breathe.  Excessive sweating.  Spasms of the larynx.  Slowed heartbeat.  Low blood pressure. BEFORE THE PROCEDURE  Do not eat or drink anything after midnight on the night before the procedure or as directed by your health care provider.  Do not take your regular medicines before the procedure if your health care provider asks you not to. Ask your health care provider about changing or stopping those medicines.  If you wear dentures, be prepared to remove them before the procedure.  Arrange for someone to drive you home after the procedure. PROCEDURE  A numbing medicine (local anesthetic) may be sprayed in your throat for comfort and to stop you from gagging or coughing.  You will have an IV tube inserted in a vein in your hand or arm. You will receive medicines and fluids through this tube.  You will be given a medicine to relax you (sedative).  A pain reliever will be given through the IV tube.  A mouth guard may be placed in your mouth to protect your teeth and to keep you from biting on the endoscope.  You will be asked to lie on your left side.  The endoscope will be inserted down your throat and into your esophagus, stomach, and duodenum.  Air will be put through the endoscope to allow your health care provider to clearly view the lining of your esophagus.  The lining of your esophagus, stomach, and duodenum will be examined. During the exam, your health care provider may:  Remove tissue to be examined under a microscope (biopsy) for inflammation, infection, or other medical problems.  Remove growths.  Remove objects (foreign bodies) that are stuck.  Treat any bleeding with medicines or other  devices that stop tissues from bleeding (hot cautery, clipping devices).  Widen (dilate) or stretch narrowed areas of your esophagus and stomach.  The endoscope will be withdrawn. AFTER THE PROCEDURE  You will be taken to a recovery area for observation. Your blood pressure, heart rate, breathing rate, and blood oxygen level will be monitored often until the medicines you were given have worn off.  Do not eat or drink anything until the numbing medicine has worn off and your gag reflex has returned. You may choke.  Your health care provider should be able to discuss his or her findings with you. It will take longer to discuss the test results if any biopsies were taken.   This information is not intended to replace advice given to you by your health care provider. Make sure you discuss any questions you have with your health care provider.   Document Released: 09/28/2004 Document Revised: 06/18/2014 Document Reviewed: 04/30/2012 Elsevier Interactive Patient Education 2016 [employer] PATIENT INSTRUCTIONS POST-ANESTHESIA  IMMEDIATELY FOLLOWING SURGERY:  Do not drive or operate machinery for the first twenty four hours after surgery.  Do not make any important decisions for twenty four hours after surgery or while taking narcotic pain medications or sedatives.  If you develop intractable nausea and vomiting or a severe headache please notify your doctor immediately.  FOLLOW-UP:  Please make an appointment with your surgeon as instructed. You do not need to follow up with anesthesia unless specifically instructed to do so.  WOUND CARE INSTRUCTIONS (if applicable):  Keep a dry clean dressing on the anesthesia/puncture wound site if there is drainage.  Once the wound has quit draining you may leave it open to air.  Generally you should leave the bandage intact for twenty four hours unless there is drainage.  If the epidural site drains for more than 36-48 hours please call the anesthesia  department.  QUESTIONS?:  Please feel free to call your physician or the hospital operator if you have any questions, and they will be happy to assist you.

## 2015-06-01 ENCOUNTER — Other Ambulatory Visit: Payer: Self-pay | Admitting: Family Medicine

## 2015-06-02 ENCOUNTER — Inpatient Hospital Stay (HOSPITAL_COMMUNITY): Admission: RE | Admit: 2015-06-02 | Payer: Medicare Other | Source: Ambulatory Visit

## 2015-06-07 ENCOUNTER — Ambulatory Visit (HOSPITAL_COMMUNITY)
Admission: RE | Admit: 2015-06-07 | Discharge: 2015-06-07 | Disposition: A | Discharge status: Home / Self Care | Payer: Medicare Other | Source: Ambulatory Visit | Attending: Urology | Admitting: Urology

## 2015-06-07 ENCOUNTER — Ambulatory Visit: Payer: Medicare Other | Admitting: Urology

## 2015-06-07 ENCOUNTER — Other Ambulatory Visit: Payer: Self-pay | Admitting: Urology

## 2015-06-07 DIAGNOSIS — N200 Calculus of kidney: Secondary | ICD-10-CM | POA: Diagnosis present

## 2015-06-10 ENCOUNTER — Encounter (HOSPITAL_COMMUNITY)
Admission: RE | Disposition: A | Discharge status: Home / Self Care | Payer: Self-pay | Source: Ambulatory Visit | Attending: Gastroenterology

## 2015-06-10 ENCOUNTER — Ambulatory Visit (HOSPITAL_COMMUNITY): Payer: Medicare Other | Admitting: Anesthesiology

## 2015-06-10 ENCOUNTER — Ambulatory Visit (HOSPITAL_COMMUNITY)
Admission: RE | Admit: 2015-06-10 | Discharge: 2015-06-10 | Disposition: A | Discharge status: Home / Self Care | Payer: Medicare Other | Source: Ambulatory Visit | Attending: Gastroenterology | Admitting: Gastroenterology

## 2015-06-10 ENCOUNTER — Encounter (HOSPITAL_COMMUNITY): Payer: Self-pay | Admitting: *Deleted

## 2015-06-10 DIAGNOSIS — K222 Esophageal obstruction: Secondary | ICD-10-CM | POA: Diagnosis not present

## 2015-06-10 DIAGNOSIS — K2970 Gastritis, unspecified, without bleeding: Secondary | ICD-10-CM

## 2015-06-10 DIAGNOSIS — E119 Type 2 diabetes mellitus without complications: Secondary | ICD-10-CM | POA: Insufficient documentation

## 2015-06-10 DIAGNOSIS — F329 Major depressive disorder, single episode, unspecified: Secondary | ICD-10-CM | POA: Diagnosis not present

## 2015-06-10 DIAGNOSIS — I10 Essential (primary) hypertension: Secondary | ICD-10-CM | POA: Diagnosis not present

## 2015-06-10 DIAGNOSIS — F419 Anxiety disorder, unspecified: Secondary | ICD-10-CM | POA: Insufficient documentation

## 2015-06-10 DIAGNOSIS — G4733 Obstructive sleep apnea (adult) (pediatric): Secondary | ICD-10-CM | POA: Diagnosis not present

## 2015-06-10 DIAGNOSIS — Z7982 Long term (current) use of aspirin: Secondary | ICD-10-CM | POA: Insufficient documentation

## 2015-06-10 DIAGNOSIS — E785 Hyperlipidemia, unspecified: Secondary | ICD-10-CM | POA: Insufficient documentation

## 2015-06-10 DIAGNOSIS — R1310 Dysphagia, unspecified: Secondary | ICD-10-CM

## 2015-06-10 DIAGNOSIS — Z79899 Other long term (current) drug therapy: Secondary | ICD-10-CM | POA: Insufficient documentation

## 2015-06-10 DIAGNOSIS — R112 Nausea with vomiting, unspecified: Secondary | ICD-10-CM | POA: Diagnosis not present

## 2015-06-10 DIAGNOSIS — K319 Disease of stomach and duodenum, unspecified: Secondary | ICD-10-CM | POA: Diagnosis not present

## 2015-06-10 DIAGNOSIS — K219 Gastro-esophageal reflux disease without esophagitis: Secondary | ICD-10-CM | POA: Diagnosis not present

## 2015-06-10 DIAGNOSIS — Z7984 Long term (current) use of oral hypoglycemic drugs: Secondary | ICD-10-CM | POA: Insufficient documentation

## 2015-06-10 DIAGNOSIS — K317 Polyp of stomach and duodenum: Secondary | ICD-10-CM | POA: Diagnosis not present

## 2015-06-10 HISTORY — PX: SAVORY DILATION: SHX5439

## 2015-06-10 HISTORY — PX: ESOPHAGOGASTRODUODENOSCOPY (EGD) WITH PROPOFOL: SHX5813

## 2015-06-10 HISTORY — PX: BIOPSY: SHX5522

## 2015-06-10 LAB — GLUCOSE, CAPILLARY
Glucose-Capillary: 115 mg/dL — ABNORMAL HIGH (ref 65–99)
Glucose-Capillary: 98 mg/dL (ref 65–99)

## 2015-06-10 SURGERY — ESOPHAGOGASTRODUODENOSCOPY (EGD) WITH PROPOFOL
Anesthesia: Monitor Anesthesia Care

## 2015-06-10 MED ORDER — LACTATED RINGERS IV SOLN
INTRAVENOUS | Status: DC
  Administered 2015-06-10: 10:00:00 via INTRAVENOUS

## 2015-06-10 MED ORDER — ONDANSETRON HCL 4 MG/2ML IJ SOLN: 4.0000 mg | Freq: Once | INTRAMUSCULAR | Status: DC | PRN

## 2015-06-10 MED ORDER — PROPOFOL 500 MG/50ML IV EMUL
INTRAVENOUS | Status: DC | PRN
  Administered 2015-06-10: 125 ug/kg/min via INTRAVENOUS

## 2015-06-10 MED ORDER — MIDAZOLAM HCL 2 MG/2ML IJ SOLN
1.0000 mg | INTRAMUSCULAR | Status: DC | PRN
  Administered 2015-06-10: 2 mg via INTRAVENOUS

## 2015-06-10 MED ORDER — ONDANSETRON HCL 4 MG/2ML IJ SOLN
4.0000 mg | Freq: Once | INTRAMUSCULAR | Status: AC
  Administered 2015-06-10: 4 mg via INTRAVENOUS

## 2015-06-10 MED ORDER — ONDANSETRON HCL 4 MG/2ML IJ SOLN
INTRAMUSCULAR | Status: AC
  Filled 2015-06-10: qty 2

## 2015-06-10 MED ORDER — GLYCOPYRROLATE 0.2 MG/ML IJ SOLN
INTRAMUSCULAR | Status: AC
  Filled 2015-06-10: qty 1

## 2015-06-10 MED ORDER — FENTANYL CITRATE (PF) 100 MCG/2ML IJ SOLN
INTRAMUSCULAR | Status: AC
  Filled 2015-06-10: qty 2

## 2015-06-10 MED ORDER — LIDOCAINE VISCOUS 2 % MT SOLN
OROMUCOSAL | Status: AC
  Filled 2015-06-10: qty 15

## 2015-06-10 MED ORDER — LIDOCAINE VISCOUS 2 % MT SOLN
6.0000 mL | Freq: Once | OROMUCOSAL | Status: AC
  Administered 2015-06-10: 6 mL via OROMUCOSAL

## 2015-06-10 MED ORDER — FENTANYL CITRATE (PF) 100 MCG/2ML IJ SOLN: 25.0000 ug | INTRAMUSCULAR | Status: DC | PRN

## 2015-06-10 MED ORDER — FENTANYL CITRATE (PF) 100 MCG/2ML IJ SOLN
25.0000 ug | INTRAMUSCULAR | Status: AC
  Administered 2015-06-10: 25 ug via INTRAVENOUS

## 2015-06-10 MED ORDER — MIDAZOLAM HCL 2 MG/2ML IJ SOLN
INTRAMUSCULAR | Status: AC
  Filled 2015-06-10: qty 2

## 2015-06-10 MED ORDER — MIDAZOLAM HCL 5 MG/5ML IJ SOLN
INTRAMUSCULAR | Status: DC | PRN
  Administered 2015-06-10: 2 mg via INTRAVENOUS

## 2015-06-10 MED ORDER — PROPOFOL 10 MG/ML IV BOLUS
INTRAVENOUS | Status: AC
  Filled 2015-06-10: qty 20

## 2015-06-10 MED ORDER — GLYCOPYRROLATE 0.2 MG/ML IJ SOLN
0.2000 mg | Freq: Once | INTRAMUSCULAR | Status: AC
  Administered 2015-06-10: 0.2 mg via INTRAVENOUS

## 2015-06-10 NOTE — Discharge Instructions (Signed)
I dilated your esophagus. You have a stricture near the base of your esophagus.  You have mild gastritis. I biopsied your stomach.   FOLLOW A LOW FAT DIET. MEATS SHOULD BE BAKED, BROILED, OR BOILED. AVOID ITEMS THAT CAUSE BLOATING & GAS.SEE INFO BELOW.  YOUR BIOPSY RESULTS WILL BE AVAILABLE IN MY CHART JAN 3 AND MY OFFICE WILL CONTACT YOU IN 10-14 DAYS WITH YOUR RESULTS.   FOLLOW UP IN April 2017.  UPPER ENDOSCOPY AFTER CARE Read the instructions outlined below and refer to this sheet in the next week. These discharge instructions provide you with general information on caring for yourself after you leave the hospital. While your treatment has been planned according to the most current medical practices available, unavoidable complications occasionally occur. If you have any problems or questions after discharge, call DR. Nazaire Cordial, [phone].  ACTIVITY  You may resume your regular activity, but move at a slower pace for the next 24 hours.   Take frequent rest periods for the next 24 hours.   Walking will help get rid of the air and reduce the bloated feeling in your belly (abdomen).   No driving for 24 hours (because of the medicine (anesthesia) used during the test).   You may shower.   Do not sign any important legal documents or operate any machinery for 24 hours (because of the anesthesia used during the test).    NUTRITION  Drink plenty of fluids.   You may resume your normal diet as instructed by your doctor.   Begin with a light meal and progress to your normal diet. Heavy or fried foods are harder to digest and may make you feel sick to your stomach (nauseated).   Avoid alcoholic beverages for 24 hours or as instructed.    MEDICATIONS  You may resume your normal medications.   WHAT YOU CAN EXPECT TODAY  Some feelings of bloating in the abdomen.   Passage of more gas than usual.    IF YOU HAD A BIOPSY TAKEN DURING THE UPPER ENDOSCOPY:  Eat a soft diet IF  YOU HAVE NAUSEA, BLOATING, ABDOMINAL PAIN, OR VOMITING.    FINDING OUT THE RESULTS OF YOUR TEST Not all test results are available during your visit. DR. Oneida Alar WILL CALL YOU WITHIN 14 DAYS OF YOUR PROCEDUE WITH YOUR RESULTS. Do not assume everything is normal if you have not heard from DR. Othar Curto, CALL HER OFFICE AT [phone].  SEEK IMMEDIATE MEDICAL ATTENTION AND CALL THE OFFICE: [phone] IF:  You have more than a spotting of blood in your stool.   Your belly is swollen (abdominal distention).   You are nauseated or vomiting.   You have a temperature over 101F.   You have abdominal pain or discomfort that is severe or gets worse throughout the day.  Gastritis  Gastritis is an inflammation (the body's way of reacting to injury and/or infection) of the stomach. It is often caused by viral or bacterial (germ) infections. It can also be caused BY ASPIRIN, BC/GOODY POWDER'S, (IBUPROFEN) MOTRIN, OR ALEVE (NAPROXEN), chemicals (including alcohol), SPICY FOODS, and medications. This illness may be associated with generalized malaise (feeling tired, not well), UPPER ABDOMINAL STOMACH cramps, and fever. One common bacterial cause of gastritis is an organism known as H. Pylori. This can be treated with antibiotics.   ESOPHAGEAL STRICTURE  Esophageal strictures can be caused by stomach acid backing up into the tube that carries food from the mouth down to the stomach (lower esophagus).  TREATMENT There  are a number of medicines used to treat reflux/stricture, including: Antacids.  Proton-pump inhibitors: OMEPRAZOLE  HOME CARE INSTRUCTIONS Eat 2-3 hours before going to bed.  Try to reach and maintain a healthy weight.  Do not eat just a few very large meals. Instead, eat 4 TO 6 smaller meals throughout the day.  Try to identify foods and beverages that make your symptoms worse, and avoid these.  Avoid tight clothing.  Do not exercise right after eating.  Low-Fat Diet BREADS,  CEREALS, PASTA, RICE, DRIED PEAS, AND BEANS These products are high in carbohydrates and most are low in fat. Therefore, they can be increased in the diet as substitutes for fatty foods. They too, however, contain calories and should not be eaten in excess. Cereals can be eaten for snacks as well as for breakfast.   FRUITS AND VEGETABLES It is good to eat fruits and vegetables. Besides being sources of fiber, both are rich in vitamins and some minerals. They help you get the daily allowances of these nutrients. Fruits and vegetables can be used for snacks and desserts.  MEATS Limit lean meat, chicken, [place], and fish to no more than 6 ounces per day. Beef, Pork, and Lamb Use lean cuts of beef, pork, and lamb. Lean cuts include:  Extra-lean ground beef.  Arm roast.  Sirloin tip.  Center-cut ham.  Round steak.  Loin chops.  Rump roast.  Tenderloin.  Trim all fat off the outside of meats before cooking. It is not necessary to severely decrease the intake of red meat, but lean choices should be made. Lean meat is rich in protein and contains a highly absorbable form of iron. Premenopausal women, in particular, should avoid reducing lean red meat because this could increase the risk for low red blood cells (iron-deficiency anemia).  Chicken and [place] These are good sources of protein. The fat of poultry can be reduced by removing the skin and underlying fat layers before cooking. Chicken and [place] can be substituted for lean red meat in the diet. Poultry should not be fried or covered with high-fat sauces. Fish and Shellfish Fish is a good source of protein. Shellfish contain cholesterol, but they usually are low in saturated fatty acids. The preparation of fish is important. Like chicken and [place], they should not be fried or covered with high-fat sauces. EGGS Egg whites contain no fat or cholesterol. They can be eaten often. Try 1 to 2 egg whites instead of whole eggs in recipes or use egg  substitutes that do not contain yolk. MILK AND DAIRY PRODUCTS Use skim or 1% milk instead of 2% or whole milk. Decrease whole milk, natural, and processed cheeses. Use nonfat or low-fat (2%) cottage cheese or low-fat cheeses made from vegetable oils. Choose nonfat or low-fat (1 to 2%) yogurt. Experiment with evaporated skim milk in recipes that call for heavy cream. Substitute low-fat yogurt or low-fat cottage cheese for sour cream in dips and salad dressings. Have at least 2 servings of low-fat dairy products, such as 2 glasses of skim (or 1%) milk each day to help get your daily calcium intake. FATS AND OILS Reduce the total intake of fats, especially saturated fat. Butterfat, lard, and beef fats are high in saturated fat and cholesterol. These should be avoided as much as possible. Vegetable fats do not contain cholesterol, but certain vegetable fats, such as coconut oil, palm oil, and palm kernel oil are very high in saturated fats. These should be limited. These fats are often  used in [employer], processed foods, popcorn, oils, and nondairy creamers. Vegetable shortenings and some peanut butters contain hydrogenated oils, which are also saturated fats. Read the labels on these foods and check for saturated vegetable oils. Unsaturated vegetable oils and fats do not raise blood cholesterol. However, they should be limited because they are fats and are high in calories. Total fat should still be limited to 30% of your daily caloric intake. Desirable liquid vegetable oils are corn oil, cottonseed oil, olive oil, canola oil, safflower oil, soybean oil, and sunflower oil. Peanut oil is not as good, but small amounts are acceptable. Buy a heart-healthy tub margarine that has no partially hydrogenated oils in the ingredients. Mayonnaise and salad dressings often are made from unsaturated fats, but they should also be limited because of their high calorie and fat content. Seeds, nuts, peanut butter, olives, and  avocados are high in fat, but the fat is mainly the unsaturated type. These foods should be limited mainly to avoid excess calories and fat. OTHER EATING TIPS Snacks  Most sweets should be limited as snacks. They tend to be rich in calories and fats, and their caloric content outweighs their nutritional value. Some good choices in snacks are graham crackers, melba toast, soda crackers, bagels (no egg), English muffins, fruits, and vegetables. These snacks are preferable to snack crackers, [place] fries, TORTILLA CHIPS, and POTATO chips. Popcorn should be air-popped or cooked in small amounts of liquid vegetable oil. Desserts Eat fruit, low-fat yogurt, and fruit ices instead of pastries, cake, and cookies. Sherbet, angel food cake, gelatin dessert, frozen low-fat yogurt, or other frozen products that do not contain saturated fat (pure fruit juice bars, frozen ice pops) are also acceptable.  COOKING METHODS Choose those methods that use little or no fat. They include: Poaching.  Braising.  Steaming.  Grilling.  Baking.  Stir-frying.  Broiling.  Microwaving.  Foods can be cooked in a nonstick pan without added fat, or use a nonfat cooking spray in regular cookware. Limit fried foods and avoid frying in saturated fat. Add moisture to lean meats by using water, broth, cooking wines, and other nonfat or low-fat sauces along with the cooking methods mentioned above. Soups and stews should be chilled after cooking. The fat that forms on top after a few hours in the refrigerator should be skimmed off. When preparing meals, avoid using excess salt. Salt can contribute to raising blood pressure in some people.  EATING AWAY FROM HOME Order entres, potatoes, and vegetables without sauces or butter. When meat exceeds the size of a deck of cards (3 to 4 ounces), the rest can be taken home for another meal. Choose vegetable or fruit salads and ask for low-calorie salad dressings to be served on the side. Use  dressings sparingly. Limit high-fat toppings, such as bacon, crumbled eggs, cheese, sunflower seeds, and olives. Ask for heart-healthy tub margarine instead of butter.

## 2015-06-10 NOTE — Anesthesia Postprocedure Evaluation (Signed)
Anesthesia Post Note  Patient: Cheryl Abbott  Procedure(s) Performed: Procedure(s) (LRB): ESOPHAGOGASTRODUODENOSCOPY (EGD) WITH PROPOFOL (N/A) SAVORY DILATION (N/A) BIOPSY  Patient location during evaluation: PACU Anesthesia Type: MAC Level of consciousness: awake and patient cooperative Pain management: pain level controlled Vital Signs Assessment: post-procedure vital signs reviewed and stable Respiratory status: nonlabored ventilation Cardiovascular status: blood pressure returned to baseline Postop Assessment: no signs of nausea or vomiting Anesthetic complications: no    Last Vitals:  Filed Vitals:   06/10/15 1000 06/10/15 1005  BP: 123/74 123/77  Temp:    Resp: 19 17    Last Pain: There were no vitals filed for this visit.               Marigny Borre J

## 2015-06-10 NOTE — Anesthesia Preprocedure Evaluation (Signed)
Anesthesia Evaluation  Patient identified by MRN, date of birth, ID band Patient awake    Reviewed: Allergy & Precautions, NPO status , Patient's Chart, lab work & pertinent test results  History of Anesthesia Complications Negative for: history of anesthetic complications  Airway Mallampati: III  TM Distance: >3 FB     Dental  (+) Teeth Intact   Pulmonary neg pulmonary ROS, former smoker,    Pulmonary exam normal        Cardiovascular Exercise Tolerance: Poor hypertension, Pt. on medications Normal cardiovascular exam     Neuro/Psych  Headaches, PSYCHIATRIC DISORDERS Anxiety Depression  Neuromuscular disease negative neurological ROS     GI/Hepatic GERD  Medicated,  Endo/Other  diabetes, Well Controlled  Renal/GU      Musculoskeletal negative musculoskeletal ROS (+)   Abdominal Normal abdominal exam  (+)   Peds  Hematology negative hematology ROS (+)   Anesthesia Other Findings   Reproductive/Obstetrics negative OB ROS                             Anesthesia Physical Anesthesia Plan  ASA: III  Anesthesia Plan: MAC   Post-op Pain Management:    Induction: Intravenous  Airway Management Planned: Simple Face Mask  Additional Equipment:   Intra-op Plan:   Post-operative Plan:   Informed Consent: I have reviewed the patients History and Physical, chart, labs and discussed the procedure including the risks, benefits and alternatives for the proposed anesthesia with the patient or authorized representative who has indicated his/her understanding and acceptance.   Dental advisory given  Plan Discussed with: CRNA and Surgeon  Anesthesia Plan Comments:         Anesthesia Quick Evaluation

## 2015-06-10 NOTE — H&P (View-Only) (Signed)
Subjective:    Patient ID: Cheryl Abbott, female    DOB: 1946-05-22, 69 y.o.   MRN: [id number]  Tula Nakayama, MD  HPI HAVING DIFFICULTY SWALLOWING. LOST 15 LBS SINCE DEC 2015 UNINTENTIONAL. DIFFICULTY SWALLOWING OVER LAST 6 MOS. GETS STRANGLED 3-4 TIMES A WEEK. MOSTLY WITH SOLIDS AND SOMETIMES MUCOUS. NAUSEA SOME MORNINGS AND VOMITING RARELY AFTER/DURING BRUSHING TEETH. BMs: 1-2/DAY((#6 MOSTLY). RARE ACID REFLUX CHEST PAIN. RARE CONSTIPATION. IF SHE SKIPS Q3 DAYS-TAKE STOOL SOFTENER. LAST EGD/DIL OCT 2014. ALSO C/O LESIONS ON LOWER LEGS THAT ITCH. WONDERS OF SHE HAS A BLOOD CLOT IN HER LEG.  PT DENIES FEVER, CHILLS, HEMATOCHEZIA, HEMATEMESIS, melena, diarrhea, SHORTNESS OF BREATH, CHANGE IN BOWEL IN HABITS, constipation, abdominal pain, OR problems with sedation.   Past Medical History  Diagnosis Date  . Anxiety disorder   . Hyperlipidemia   . Hypertension   . Complication of anesthesia     pt states she was not given enough medication with  tcs with Dr Tamala Julian                                    last [place].as able to feel and hear everything.  . Depression   . Sciatica   . Chronic back pain   . Headache   . Type 2 diabetes mellitus ([place])   . GERD (gastroesophageal reflux disease)   . Anemia   . History of adenomatous polyp of colon     2008  . Fatty liver   . Sigmoid diverticulosis   . Left ureteral stone   . Nephrolithiasis   . History of kidney stones   . Lumbar disc disease with radiculopathy   . Arthritis   . Neuropathy, peripheral ([place])   . Hypercalcemia   . OSA treated with BiPAP     per study 2007   Past Surgical History  Procedure Laterality Date  . Flexible sigmoidoscopy  09/11/2011    [id number] Internal hemorrhoids  . Esophagogastroduodenoscopy (egd) with propofol N/A 03/17/2013    Procedure: ESOPHAGOGASTRODUODENOSCOPY (EGD) WITH PROPOFOL;  Surgeon: Danie Binder, MD;  Location: [facility];  Service: Endoscopy;  Laterality: N/A;  . Savory dilation N/A  03/17/2013    Procedure: SAVORY DILATION;  Surgeon: Danie Binder, MD;  Location: [facility];  Service: Endoscopy;  Laterality: N/A;  #12.8, 14, 15, 16 dilators used  . Esophageal biopsy N/A 03/17/2013    Procedure: GASTRIC BIOPSIES;  Surgeon: Danie Binder, MD;  Location: [facility];  Service: Endoscopy;  Laterality: N/A;  . Polypectomy N/A 03/17/2013    Procedure: GASTRIC POLYPECTOMY;  Surgeon: Danie Binder, MD;  Location: [facility];  Service: Endoscopy;  Laterality: N/A;  . Colonoscopy  last one 10/02/2011    [facility] [place] [facility], IH, NEXT TCS APR 2018  . Cysto/   right ureterosocopy laser lithotripsy stone extraction  10-13-2004  . Cysto/  right retrograde pyelogram/  placment right ureteral stent  01-10-2010  . Percutaneous nephrostolithotomy Bilateral 12/  2011     [facility]  . Cardiovascular stress test  01-01-2014    normal lexiscan cardiolite/  no ischemia/ infarct/  normal LV function and wall motion ,  ef 81%  . Transthoracic echocardiogram  01-01-2014    mild LVH/  ef [id number]  grade I diastolic dysfunction/  trivial MR, TR, and PR  . Cardiac catheterization  05-11-2003  dr Shelva Majestic ([facility])    Abnormal cardiolite/  Normal coronary arteries and normal LVF,  ef  63%  . Removal right thigh cyst  2006  . Vaginal hysterectomy  1970's  . Cystoscopy with retrograde pyelogram, ureteroscopy and stent placement Left 10/21/2014    Procedure: CYSTOSCOPY WITH RETROGRADE PYELOGRAM, URETEROSCOPY AND STENT PLACEMENT;  Surgeon: Franchot Gallo, MD;  Location: [facility];  Service: Urology;  Laterality: Left;  . Holmium laser application Left [id number]    Procedure: HOLMIUM LASER APPLICATION;  Surgeon: Franchot Gallo, MD;  Location: [facility];  Service: Urology;  Laterality: Left;  Marland Kitchen Mass excision Left 03/04/2015    Procedure: EXCISION OF SOFT TISSUE NEOPLASM LEFT ARM;  Surgeon: Aviva Signs, MD;  Location: [facility];  Service: General;  Laterality: Left;    Allergies  Allergen Reactions  . Ace Inhibitors Cough  . Keflex [Cephalexin] Diarrhea and Nausea And Vomiting  . Nitrofurantoin Diarrhea and Nausea And Vomiting  . Penicillins Hives, Rash and Other (See Comments)    Blisters on hands and feet   Current Outpatient Prescriptions  Medication Sig Dispense Refill  . ALPRAZolam (XANAX) 0.5 MG tablet TAKE 1 TABLET BY MOUTH TWICE A DAY    . amLODipine (NORVASC) 10 MG tablet Take 1 tablet (10 mg total) by mouth daily. (Patient taking differently: Take 10 mg by mouth every morning. )    . aspirin EC 81 MG tablet Take 81 mg by mouth daily.    . diphenhydramine-acetaminophen (TYLENOL PM) 25-500 MG TABS Take 1-2 tablets by mouth at bedtime as needed (for sleep/pain).     Marland Kitchen diphenoxylate-atropine (LOMOTIL) 2.5-0.025 MG per tablet Take 1 tablet by mouth 4 (four) times daily as needed for diarrhea or loose stools.    . docusate sodium (COLACE) 100 MG capsule Take 100-200 mg by mouth daily as needed for mild constipation or moderate constipation.     . gabapentin (NEURONTIN) 100 MG capsule TAKE 1 CAPSULE (100 MG TOTAL) BY MOUTH AT BEDTIME.    Marland Kitchen lovastatin (MEVACOR) 40 MG tablet Take 2 tablets (80 mg total) by mouth at bedtime.    . metFORMIN (GLUCOPHAGE) 500 MG tablet TAKE 2 TABLETS (1,000 MG TOTAL) BY MOUTH 2 (TWO) TIMES DAILY WITH A MEAL.    . pantoprazole (PROTONIX) 40 MG tablet TAKE 1 TABLET BY MOUTH TWICE A DAY    . PARoxetine (PAXIL) 40 MG tablet Take 1 tablet (40 mg total) by mouth every morning.    . potassium citrate (UROCIT-K) 10 MEQ (1080 MG) SR tablet TAKE 1 TABLET 3 TIMES DAILY WITH MEALS. (Patient taking differently: Take 10 mEq by mouth 3 (three) times daily with meals. TAKE 1 TABLET 3 TIMES DAILY WITH MEALS.)    . traMADol (ULTRAM) 50 MG tablet One at bedtime    . Vitamin D, Ergocalciferol, (DRISDOL) 50000 UNITS CAPS capsule TAKE 1 CAPSULE (50,000 UNITS TOTAL) BY MOUTH EVERY 7 (SEVEN) DAYS. (Patient taking differently: Take 50,000 Units by  mouth every Sunday. TAKE 1 CAPSULE (50,000 UNITS TOTAL) BY MOUTH EVERY 7 (SEVEN) DAYS.)     Review of Systems PER HPI OTHERWISE ALL SYSTEMS ARE NEGATIVE.    Objective:   Physical Exam  Constitutional: She is oriented to person, place, and time. She appears well-developed and well-nourished. No distress.  HENT:  Head: Normocephalic and atraumatic.  Mouth/Throat: Oropharynx is clear and moist. No oropharyngeal exudate.  Eyes: Pupils are equal, round, and reactive to light. No scleral icterus.  Neck: Normal range of motion. Neck supple.  Cardiovascular: Normal rate, regular rhythm and  normal heart sounds.   Pulmonary/Chest: Eff[place] normal and breath sounds normal. No respiratory distress.  Abdominal: Soft. Bowel sounds are normal. She exhibits no distension. There is no tenderness.  Musculoskeletal: She exhibits no edema.  Lymphadenopathy:    She has no cervical adenopathy.  Neurological: She is alert and oriented to person, place, and time.  NO FOCAL DEFICITS  Skin:  DRKYSKIN ON LOWER LEGS WITH HYPERPIGMENTED ROUND EXCORIATED (SMALL) AREAS x2.  Psychiatric:  FLAT AFFECT, NL MOOD  Vitals reviewed.     Assessment & Plan:

## 2015-06-10 NOTE — Op Note (Addendum)
[facility] [address] [place], [address]   ENDOSCOPY PROCEDURE REPORT  PATIENT: Cheryl, Abbott  MR#: [id number] BIRTHDATE: December 04, 1945 , 69  yrs. old GENDER: female  ENDOSCOPIST: Danie Binder, MD REFFERED [id number] Moshe Cipro, M.D.  PROCEDURE DATE:  06/10/2015 PROCEDURE:   EGD with biopsy and EGD with dilatation over guidewire   INDICATIONS:1.  dysphagia. MEDICATIONS: Monitored anesthesia care TOPICAL ANESTHETIC: Viscous Xylocaine  DESCRIPTION OF PROCEDURE:   After the risks benefits and alternatives of the procedure were thoroughly explained, informed consent was obtained.  The [id number]  endoscope was introduced through the mouth and advanced to the second portion of the duodenum. The instrument was slowly withdrawn as the mucosa was carefully examined.  Prior to withdrawal of the scope, the guidwire was placed.  The esophagus was dilated successfully.  The patient was recovered in endoscopy and discharged home in satisfactory condition. Estimated blood loss is zero unless otherwise noted in this procedure report.  ESOPHAGUS: A stricture was found at the gastroesophageal junction. The stenosis was traversable with the endoscope.  STOMACH: Moderate non-erosive gastritis (inflammation) was found in the gastric antrum.  Multiple biopsies were performed using cold forceps. DUODENUM: The duodenal mucosa showed no abnormalities in the bulb and second portion of the duodenum.   Dilation was then performed at the gastroesphageal junctionDilator: Savary over guidewire Size(s): 14-17 mm Heme: yes trace COMPLICATIONS: There were no immediate complications.  ENDOSCOPIC IMPRESSION: 1.   Distal gastritis 2.   Distal esophageal stricture RECOMMENDATIONS: FOLLOW A LOW FAT DIET. AWAIT BIOPSY RESULTS. FOLLOW UP IN April 2017.     _______________________________ eSignedDanie Binder, MD 2015-06-19 6:26 PM Revised: Jun 19, 2015 6:26 PM  CPT  CODES: ICD CODES:  The ICD and CPT codes recommended by this software are interpretations from the data that the clinical staff has captured with the software.  The verification of the translation of this report to the ICD and CPT codes and modifiers is the sole responsibility of the health care institution and practicing physician where this report was generated.  [employer] will not be held responsible for the validity of the ICD and CPT codes included on this report.  AMA assumes no liability for data contained or not contained herein. CPT is a Designer, television/film set of the [employer].

## 2015-06-10 NOTE — Interval H&P Note (Signed)
History and Physical Interval Note:  06/10/2015 9:49 AM  Cheryl Abbott  has presented today for surgery, with the diagnosis of [place]  The various methods of treatment have been discussed with the patient and family. After consideration of risks, benefits and other options for treatment, the patient has consented to  Procedure(s) with comments: ESOPHAGOGASTRODUODENOSCOPY (EGD) WITH PROPOFOL (N/A) - 1245 - pt knows to arrive at 8:30 SAVORY DILATION (N/A) MALONEY DILATION (N/A) as a surgical intervention .  The patient's history has been reviewed, patient examined, no change in status, stable for surgery.  I have reviewed the patient's chart and labs.  Questions were answered to the patient's satisfaction.     [employer]

## 2015-06-10 NOTE — Transfer of Care (Signed)
Immediate Anesthesia Transfer of Care Note  Patient: Cheryl Abbott  Procedure(s) Performed: Procedure(s) with comments: ESOPHAGOGASTRODUODENOSCOPY (EGD) WITH PROPOFOL (N/A) - 1245 - pt knows to arrive at 8:30 SAVORY DILATION (N/A) BIOPSY - gastric biopsy  Patient Location: PACU  Anesthesia Type:MAC  Level of Consciousness: awake  Airway & Oxygen Therapy: Patient Spontanous Breathing and Patient connected to face mask oxygen  Post-op Assessment: Report given to RN, Post -op Vital signs reviewed and stable and Patient moving all extremities  Post vital signs: Reviewed and stable  Last Vitals:  Filed Vitals:   06/10/15 1000 06/10/15 1005  BP: 123/74 123/77  Temp:    Resp: 19 17    Complications: No apparent anesthesia complications

## 2015-06-14 ENCOUNTER — Telehealth: Payer: Self-pay | Admitting: Gastroenterology

## 2015-06-14 ENCOUNTER — Ambulatory Visit (INDEPENDENT_AMBULATORY_CARE_PROVIDER_SITE_OTHER): Payer: Medicare Other | Admitting: Urology

## 2015-06-14 DIAGNOSIS — N200 Calculus of kidney: Secondary | ICD-10-CM | POA: Diagnosis not present

## 2015-06-14 NOTE — Telephone Encounter (Signed)
[phone]  PATIENT CAME IN INQUIRING ABOUT TEST RESULTS FROM Friday 06/10/15, I TOLD HER IT CAN TAKE UP TO 10 DAYS AND THAT THE NURSE WOULD CALL HER AS SOON AS SHE WAS NOTIFIED OF THE RESULTS

## 2015-06-14 NOTE — Telephone Encounter (Signed)
Please call pt. HER stomach Bx shows gastritis. HER BLOOD COUNT IS LOWER THAN IT WAS IN MAY 2016. SHE NEEDS A FERRITIN AND BMP IN THE NEXT 7 DAYS.  FOLLOW A LOW FAT DIET. MEATS SHOULD BE BAKED, BROILED, OR BOILED. AVOID ITEMS THAT CAUSE BLOATING & GAS. FOLLOW UP IN April 2017.

## 2015-06-15 ENCOUNTER — Other Ambulatory Visit: Payer: Self-pay

## 2015-06-15 ENCOUNTER — Other Ambulatory Visit: Payer: Self-pay | Admitting: Urology

## 2015-06-15 DIAGNOSIS — D649 Anemia, unspecified: Secondary | ICD-10-CM

## 2015-06-15 DIAGNOSIS — K760 Fatty (change of) liver, not elsewhere classified: Secondary | ICD-10-CM

## 2015-06-15 NOTE — Telephone Encounter (Signed)
OV made °

## 2015-06-15 NOTE — Telephone Encounter (Signed)
Pt is aware of results and lab orders have been faxed to [facility] and she will go within the next few days.

## 2015-06-15 NOTE — Telephone Encounter (Signed)
See result note. Pt is aware of results.  

## 2015-06-16 ENCOUNTER — Encounter (HOSPITAL_COMMUNITY): Payer: Self-pay | Admitting: Gastroenterology

## 2015-06-17 LAB — BASIC METABOLIC PANEL
BUN: 11 mg/dL (ref 7–25)
CO2: 25 mmol/L (ref 20–31)
Calcium: 10.1 mg/dL (ref 8.6–10.4)
Chloride: 99 mmol/L (ref 98–110)
Creat: 1.03 mg/dL — ABNORMAL HIGH (ref 0.60–0.93)
Glucose, Bld: 231 mg/dL — ABNORMAL HIGH (ref 65–99)
Potassium: 4.1 mmol/L (ref 3.5–5.3)
Sodium: 138 mmol/L (ref 135–146)

## 2015-06-17 LAB — FERRITIN: Ferritin: 8 ng/mL — ABNORMAL LOW (ref 10–291)

## 2015-06-21 ENCOUNTER — Encounter: Payer: Medicare Other | Admitting: Family Medicine

## 2015-06-21 NOTE — Telephone Encounter (Signed)
Called pt and she is aware of [facility] recommendations. Pt is having kidney surgery on 06/28/2015 ( Lithotripsy).  She is going to check with Dr. Eulogio Ditch to make sure its not to close after her procedure. Will call back to set up TCS

## 2015-06-21 NOTE — Telephone Encounter (Signed)
PLEASE CALL PT. HER IRON STORES ARE LOW. SHE NEEDS ANOTHER TCS IN THE NEXT 4-6 WEEKS, DX: [facility].

## 2015-06-22 ENCOUNTER — Other Ambulatory Visit: Payer: Self-pay

## 2015-06-22 ENCOUNTER — Encounter (HOSPITAL_COMMUNITY): Payer: Self-pay

## 2015-06-22 ENCOUNTER — Encounter (HOSPITAL_COMMUNITY)
Admission: RE | Admit: 2015-06-22 | Discharge: 2015-06-22 | Disposition: A | Discharge status: Home / Self Care | Payer: Medicare Other | Source: Ambulatory Visit | Attending: Urology | Admitting: Urology

## 2015-06-22 ENCOUNTER — Telehealth: Payer: Self-pay

## 2015-06-22 DIAGNOSIS — Z01818 Encounter for other preprocedural examination: Secondary | ICD-10-CM | POA: Diagnosis not present

## 2015-06-22 DIAGNOSIS — N200 Calculus of kidney: Secondary | ICD-10-CM | POA: Insufficient documentation

## 2015-06-22 MED ORDER — NA SULFATE-K SULFATE-MG SULF 17.5-3.13-1.6 GM/177ML PO SOLN: 1.0000 | Freq: Once | ORAL | Status: DC

## 2015-06-22 NOTE — Telephone Encounter (Signed)
Pt medications reviewed. No changes noted. Pt is set for TCS with [facility] on 07/22/2015.

## 2015-06-22 NOTE — Telephone Encounter (Signed)
Called pt back and LMOM to call office to set up TCS

## 2015-06-22 NOTE — Telephone Encounter (Signed)
Pt called back and medications reviewed. No changes. Pt is set for TCS on 07/22/2015 @ 12:00pm.  Instructions mailed to pt.

## 2015-06-22 NOTE — Patient Instructions (Signed)
Cheryl Abbott  06/22/2015     @PREFPERIOPPHARMACY @   Your procedure is scheduled on  06/28/2015   Report to [facility] at  1130  A.M.  Call this number if you have problems the morning of surgery:  [phone]   Remember:  Do not eat food or drink liquids after midnight.  Take these medicines the morning of surgery with A SIP OF WATER  Xanax, amlodipine, neurontin, protonix, paxil.   Do not wear jewelry, make-up or nail polish.  Do not wear lotions, powders, or perfumes.  You may wear deodorant.  Do not shave 48 hours prior to surgery.  Men may shave face and neck.  Do not bring valuables to the hospital.  [facility] is not responsible for any belongings or valuables.  Contacts, dentures or bridgework may not be worn into surgery.  Leave your suitcase in the car.  After surgery it may be brought to your room.  For patients admitted to the hospital, discharge time will be determined by your treatment team.  Patients discharged the day of surgery will not be allowed to drive home.   Name and phone number of your driver:   family Special instructions:  none  Please read over the following fact sheets that you were given. Pain Booklet, Coughing and Deep Breathing, Surgical Site Infection Prevention, Anesthesia Post-op Instructions and Care and Recovery After Surgery      Lithotripsy Lithotripsy is a treatment that can sometimes help eliminate kidney stones and pain that they cause. A form of lithotripsy, also known as extracorporeal shock wave lithotripsy, is a nonsurgical procedure that helps your body rid itself of the kidney stone when it is too big to pass on its own. Extracorporeal shock wave lithotripsy is a method of crushing a kidney stone with shock waves. These shock waves pass through your body and are focused on your stone. They cause the kidney stones to crumble while still in the urinary tract. It is then easier for the smaller  pieces of stone to pass in the urine. Lithotripsy usually takes about an hour. It is done in a hospital, a lithotripsy center, or a mobile unit. It usually does not require an overnight stay. Your health care provider will instruct you on preparation for the procedure. Your health care provider will tell you what to expect afterward. LET [facility] CARE PROVIDER KNOW ABOUT:  Any allergies you have.  All medicines you are taking, including vitamins, herbs, eye drops, creams, and over-the-counter medicines.  Previous problems you or members of your family have had with the use of anesthetics.  Any blood disorders you have.  Previous surgeries you have had.  Medical conditions you have. RISKS AND COMPLICATIONS Generally, lithotripsy for kidney stones is a safe procedure. However, as with any procedure, complications can occur. Possible complications include:  Infection.  Bleeding of the kidney.  Bruising of the kidney or skin.  Obstruction of the ureter.  Failure of the stone to fragment. BEFORE THE PROCEDURE  Do not eat or drink for 6-8 hours prior to the procedure. You may, however, take the medications with a sip of water that your physician instructs you to take  Do not take aspirin or aspirin-containing products for 7 days prior to your procedure  Do not take nonsteroidal anti-inflammatory products for 7 days prior to your procedure PROCEDURE A stent (flexible tube with holes) may  be placed in your ureter. The ureter is the tube that transports the urine from the kidneys to the bladder. Your health care provider may place a stent before the procedure. This will help keep urine flowing from the kidney if the fragments of the stone block the ureter. You may have an IV tube placed in one of your veins to give you fluids and medicines. These medicines may help you relax or make you sleep. During the procedure, you will lie comfortably on a fluid-filled cushion or in a warm-water  bath. After an X-ray or ultrasound exam to locate your stone, shock waves are aimed at the stone. If you are awake, you may feel a tapping sensation as the shock waves pass through your body. If large stone particles remain after treatment, a second procedure may be necessary at a later date. For comfort during the test:  Relax as much as possible.  Try to remain still as much as possible.  Try to follow instructions to speed up the test.  Let your health care provider know if you are uncomfortable, anxious, or in pain. AFTER THE PROCEDURE  After surgery, you will be taken to the recovery area. A nurse will watch and check your progress. Once you're awake, stable, and taking fluids well, you will be allowed to go home as long as there are no problems. You will also be allowed to pass your urine before discharge.You may be given antibiotics to help prevent infection. You may also be prescribed pain medicine if needed. In a week or two, your health care provider may remove your stent, if you have one. You may first have an X-ray exam to check on how successful the fragmentation of your stone has been and how much of the stone has passed. Your health care provider will check to see whether or not stone particles remain. SEEK IMMEDIATE MEDICAL CARE IF:  You develop a fever or shaking chills.  Your pain is not relieved by medicine.  You feel sick to your stomach (nauseated) and you vomit.  You develop heavy bleeding.  You have difficulty urinating.  You start to pass your stent from your penis.   This information is not intended to replace advice given to you by your health care provider. Make sure you discuss any questions you have with your health care provider.   Document Released: 05/25/2000 Document Revised: 06/18/2014 Document Reviewed: 12/11/2012 Elsevier Interactive Patient Education 2016 [employer]. Lithotripsy, Care After Refer to this sheet in the next few weeks. These  instructions provide you with information on caring for yourself after your procedure. Your health care provider may also give you more specific instructions. Your treatment has been planned according to current medical practices, but problems sometimes occur. Call your health care provider if you have any problems or questions after your procedure. WHAT TO EXPECT AFTER THE PROCEDURE   Your urine may have a red tinge for a few days after treatment. Blood loss is usually minimal.  You may have soreness in the back or flank area. This usually goes away after a few days. The procedure can cause blotches or bruises on the back where the pressure wave enters the skin. These marks usually cause only minimal discomfort and should disappear in a short time.  Stone fragments should begin to pass within 24 hours of treatment. However, a delayed passage is not unusual.  You may have pain, discomfort, and feel sick to your stomach (nauseated) when the crushed fragments  of stone are passed down the tube from the kidney to the bladder. Stone fragments can pass soon after the procedure and may last for up to 4-8 weeks.  A small number of patients may have severe pain when stone fragments are not able to pass, which leads to an obstruction.  If your stone is greater than 1 inch (2.5 cm) in diameter or if you have multiple stones that have a combined diameter greater than 1 inch (2.5 cm), you may require more than one treatment.  If you had a stent placed prior to your procedure, you may experience some discomfort, especially during urination. You may experience the pain or discomfort in your flank or back, or you may experience a sharp pain or discomfort at the base of your penis or in your lower abdomen. The discomfort usually lasts only a few minutes after urinating. HOME CARE INSTRUCTIONS   Rest at home until you feel your energy improving.  Only take over-the-counter or prescription medicines for pain,  discomfort, or fever as directed by your health care provider. Depending on the type of lithotripsy, you may need to take antibiotics and anti-inflammatory medicines for a few days.  Drink enough water and fluids to keep your urine clear or pale yellow. This helps "flush" your kidneys. It helps pass any remaining pieces of stone and prevents stones from coming back.  Most people can resume daily activities within 1-2 days after standard lithotripsy. It can take longer to recover from laser and percutaneous lithotripsy.  Strain all urine through the provided strainer. Keep all particulate matter and stones for your health care provider to see. The stone may be as small as a grain of salt. It is very important to use the strainer each and every time you pass your urine. Any stones that are found can be sent to a medical lab for examination.  Visit your health care provider for a follow-up appointment in a few weeks. Your doctor may remove your stent if you have one. Your health care provider will also check to see whether stone particles still remain. SEEK MEDICAL CARE IF:   Your pain is not relieved by medicine.  You have a lasting nauseous feeling.  You feel there is too much blood in the urine.  You develop persistent problems with frequent or painful urination that does not at least partially improve after 2 days following the procedure.  You have a congested cough.  You feel lightheaded.  You develop a rash or any other signs that might suggest an allergic problem.  You develop any reaction or side effects to your medicine(s). SEEK IMMEDIATE MEDICAL CARE IF:   You experience severe back or flank pain or both.  You see nothing but blood when you urinate.  You cannot pass any urine at all.  You have a fever or shaking chills.  You develop shortness of breath, difficulty breathing, or chest pain.  You develop vomiting that will not stop after 6-8 hours.  You have a fainting  episode.   This information is not intended to replace advice given to you by your health care provider. Make sure you discuss any questions you have with your health care provider.   Document Released: 06/17/2007 Document Revised: 02/16/2015 Document Reviewed: 12/11/2012 Elsevier Interactive Patient Education [employer]. Ureteroscopy Ureteroscopy is a surgical procedure to check for and treat a problem inside part of your urinary tract. You may need this procedure if you have frequent urinary tract infections,  blood in your urine, or a stone in one of the tubes that carries urine from your kidneys to your bladder (ureter). You may also have this procedure if your health care provider wants to check for an abnormal growth in your ureter. To do this, your surgeon passes a thin, flexible telescope (ureteroscope) into one or both ureters. LET [facility] CARE PROVIDER KNOW ABOUT:   Any allergies you have.  All medicines you are taking, including vitamins, herbs, eye drops, creams, and over-the-counter medicines.  Previous problems you or members of your family have had with the use of anesthetics.  Any blood disorders you have.  Previous surgeries you have had.  Medical conditions you have. RISKS AND COMPLICATIONS  Generally, this is a safe procedure. However, as with any procedure, problems can occur. Possible problems include:   Bleeding.  Pain.  Infection.  Scarring that narrows the ureter (stricture).  Creating a hole in the ureter (perforation). BEFORE THE PROCEDURE   You may be given a medicine to make you sleep during ureteroscopy (general anesthetic).  Do not eat or drink anything for 6-8 hours before the procedure.  You may also need to have a urine test before the procedure to check for any sign of infection.  You may be given an antibiotic medicine by injection or through an IV tube before the procedure to prevent infection.  Arrange for someone to take you  home after the procedure. PROCEDURE  You may be given one of the following medicines for this procedure:  A medicine that makes you go to sleep (general anesthetic).  A medicine injected into your spine that numbs your body below the waist (spinal anesthetic). This procedure is usually done as outpatient surgery and usually takes about 30 minutes. During the procedure:  The opening from which you urinate (urethra) will be cleaned with a germ-killing solution.  The ureteroscope will be passed through your urethra into your bladder.  A saltwater solution will flow through the ureteroscope to fill your bladder. This will help the surgeon see the openings of your ureters clearly.  The ureteroscope will then be passed into your ureter.  If a growth is found, your surgeon may take a piece of the growth (biopsy) to examine under a microscope.  If a stone is found, your surgeon may remove the stone through the ureteroscope or break up the stone using a laser, shock waves, or electrical energy.  In some cases, the surgeon may put in a tube that keeps your ureter open (ureteral stent).  When the procedure is over, the surgeon will remove the scope. Then your bladder will be emptied and you will go to the recovery area. AFTER THE PROCEDURE  After ureteroscopy, you will remain in the recovery area until the anesthesia wears off and your surgeon says you can go home.    This information is not intended to replace advice given to you by your health care provider. Make sure you discuss any questions you have with your health care provider.   Document Released: 06/02/2013 Document Reviewed: 06/02/2013 Elsevier Interactive Patient Education [employer]. Ureteroscopy, Care After Refer to this sheet in the next few weeks. These instructions provide you with information on caring for yourself after your procedure. Your health care provider may also give you more specific instructions. Your  treatment has been planned according to current medical practices, but problems sometimes occur. Call your health care provider if you have any problems or questions after your procedure.  WHAT TO EXPECT AFTER THE PROCEDURE  After your procedure, it is typical to have the following:   A burning sensation when you urinate.  Blood in your urine. HOME CARE INSTRUCTIONS   Only take medicines as directed by your health care provider. Do not take any over-the-counter pain medication unless your health care provider says it is okay.  Take a warm bath or hold a warm washcloth over your groin to relieve burning.  Drink enough fluids to keep your urine clear or pale yellow.  Drink two 8-ounce glasses of water every hour for the first 2 hours after you get home.  Continue to drink water often at home.  You can eat what you usually do.  Ask your surgeon when you can do your usual activities.  If you had a tube placed to keep urine flowing (ureteral stent), ask your health care provider when you need to return to have it removed.  Keep all follow-up appointments. SEEK MEDICAL CARE IF:   You have chills or fever.  You have burning pain for longer than 24 hours after the procedure.  You have blood in your urine for longer than 24 hours after the procedure. SEEK IMMEDIATE MEDICAL CARE IF:   You have large amounts of blood or clots in your urine.  You have very bad pain.  You have chest pain or trouble breathing.   This information is not intended to replace advice given to you by your health care provider. Make sure you discuss any questions you have with your health care provider.   Document Released: 06/02/2013 Document Reviewed: 06/02/2013 Elsevier Interactive Patient Education [employer]. Cystoscopy Cystoscopy is a procedure that is used to help your caregiver diagnose and sometimes treat conditions that affect your lower urinary tract. Your lower urinary tract includes your  bladder and the tube through which urine passes from your bladder out of your body (urethra). Cystoscopy is performed with a thin, tube-shaped instrument (cystoscope). The cystoscope has lenses and a light at the end so that your caregiver can see inside your bladder. The cystoscope is inserted at the entrance of your urethra. Your caregiver guides it through your urethra and into your bladder. There are two main types of cystoscopy:  Flexible cystoscopy (with a flexible cystoscope).  Rigid cystoscopy (with a rigid cystoscope). Cystoscopy may be recommended for many conditions, including:  Urinary tract infections.  Blood in your urine (hematuria).  Loss of bladder control (urinary incontinence) or overactive bladder.  Unusual cells found in a urine sample.  Urinary blockage.  Painful urination. Cystoscopy may also be done to remove a sample of your tissue to be checked under a microscope (biopsy). It may also be done to remove or destroy bladder stones. LET YOUR CAREGIVER KNOW ABOUT:  Allergies to food or medicine.  Medicines taken, including vitamins, herbs, eyedrops, over-the-counter medicines, and creams.  Use of steroids (by mouth or creams).  Previous problems with anesthetics or numbing medicines.  History of bleeding problems or blood clots.  Previous surgery.  Other health problems, including diabetes and kidney problems.  Possibility of pregnancy, if this applies. PROCEDURE The area around the opening to your urethra will be cleaned. A medicine to numb your urethra (local anesthetic) is used. If a tissue sample or stone is removed during the procedure, you may be given a medicine to make you sleep (general anesthetic). Your caregiver will gently insert the tip of the cystoscope into your urethra. The cystoscope will be slowly glided  through your urethra and into your bladder. Sterile fluid will flow through the cystoscope and into your bladder. The fluid will expand  and stretch your bladder. This gives your caregiver a better view of your bladder walls. The procedure lasts about 15-20 minutes. AFTER THE PROCEDURE If a local anesthetic is used, you will be allowed to go home as soon as you are ready. If a general anesthetic is used, you will be taken to a recovery area until you are stable. You may have temporary bleeding and burning on urination.   This information is not intended to replace advice given to you by your health care provider. Make sure you discuss any questions you have with your health care provider.   Document Released: 05/25/2000 Document Revised: 06/18/2014 Document Reviewed: 11/19/2011 Elsevier Interactive Patient Education 2016 [place]. Cystoscopy, Care After Refer to this sheet in the next few weeks. These instructions provide you with information on caring for yourself after your procedure. Your caregiver may also give you more specific instructions. Your treatment has been planned according to current medical practices, but problems sometimes occur. Call your caregiver if you have any problems or questions after your procedure. HOME CARE INSTRUCTIONS  Things you can do to ease any discomfort after your procedure include:  Drinking enough water and fluids to keep your urine clear or pale yellow.  Taking a warm bath to relieve any burning feelings. SEEK IMMEDIATE MEDICAL CARE IF:   You have an increase in blood in your urine.  You notice blood clots in your urine.  You have difficulty passing urine.  You have the chills.  You have abdominal pain.  You have a fever or persistent symptoms for more than 2-3 days.  You have a fever and your symptoms suddenly get worse. MAKE SURE YOU:   Understand these instructions.  Will watch your condition.  Will get help right away if you are not doing well or get worse.   This information is not intended to replace advice given to you by your health care provider. Make sure you  discuss any questions you have with your health care provider.   Document Released: 12/15/2004 Document Revised: 06/18/2014 Document Reviewed: 11/19/2011 Elsevier Interactive Patient Education 2016 [employer] PATIENT INSTRUCTIONS POST-ANESTHESIA  IMMEDIATELY FOLLOWING SURGERY:  Do not drive or operate machinery for the first twenty four hours after surgery.  Do not make any important decisions for twenty four hours after surgery or while taking narcotic pain medications or sedatives.  If you develop intractable nausea and vomiting or a severe headache please notify your doctor immediately.  FOLLOW-UP:  Please make an appointment with your surgeon as instructed. You do not need to follow up with anesthesia unless specifically instructed to do so.  WOUND CARE INSTRUCTIONS (if applicable):  Keep a dry clean dressing on the anesthesia/puncture wound site if there is drainage.  Once the wound has quit draining you may leave it open to air.  Generally you should leave the bandage intact for twenty four hours unless there is drainage.  If the epidural site drains for more than 36-48 hours please call the anesthesia department.  QUESTIONS?:  Please feel free to call your physician or the hospital operator if you have any questions, and they will be happy to assist you.

## 2015-06-22 NOTE — Telephone Encounter (Signed)
Noted  

## 2015-06-22 NOTE — Telephone Encounter (Signed)
Pt called and said Dr. Eulogio Ditch said that the colonoscopy would not interfere with her Lithotripsy.  Forwarding to [employer].

## 2015-06-23 NOTE — Telephone Encounter (Addendum)
REVIEWED-PT NEEDS TCS W/ MAC.

## 2015-06-24 ENCOUNTER — Other Ambulatory Visit: Payer: Self-pay

## 2015-06-24 NOTE — Telephone Encounter (Signed)
Noted. Pt procedure date has been changed and pt is aware. Pt is set for 07/19/2015 @ 1115. New instructions mailed.   Per Hoyle Sauer at short stay pt will be on for pre-op call  (07/13/2015) due to pt having surgery on 06/28/2015.   New instructions mailed to pt.

## 2015-06-27 ENCOUNTER — Other Ambulatory Visit: Payer: Self-pay | Admitting: Family Medicine

## 2015-06-28 ENCOUNTER — Encounter (HOSPITAL_COMMUNITY): Payer: Self-pay | Admitting: *Deleted

## 2015-06-28 ENCOUNTER — Encounter (HOSPITAL_COMMUNITY)
Admission: RE | Disposition: A | Discharge status: Home / Self Care | Payer: Self-pay | Source: Ambulatory Visit | Attending: Urology

## 2015-06-28 ENCOUNTER — Ambulatory Visit (HOSPITAL_COMMUNITY): Payer: Medicare Other | Admitting: Anesthesiology

## 2015-06-28 ENCOUNTER — Ambulatory Visit (HOSPITAL_COMMUNITY): Payer: Medicare Other

## 2015-06-28 ENCOUNTER — Ambulatory Visit (HOSPITAL_COMMUNITY)
Admission: RE | Admit: 2015-06-28 | Discharge: 2015-06-28 | Disposition: A | Discharge status: Home / Self Care | Payer: Medicare Other | Source: Ambulatory Visit | Attending: Urology | Admitting: Urology

## 2015-06-28 DIAGNOSIS — Z79899 Other long term (current) drug therapy: Secondary | ICD-10-CM | POA: Insufficient documentation

## 2015-06-28 DIAGNOSIS — F419 Anxiety disorder, unspecified: Secondary | ICD-10-CM | POA: Diagnosis not present

## 2015-06-28 DIAGNOSIS — G4733 Obstructive sleep apnea (adult) (pediatric): Secondary | ICD-10-CM | POA: Diagnosis not present

## 2015-06-28 DIAGNOSIS — N200 Calculus of kidney: Secondary | ICD-10-CM

## 2015-06-28 DIAGNOSIS — E119 Type 2 diabetes mellitus without complications: Secondary | ICD-10-CM | POA: Insufficient documentation

## 2015-06-28 DIAGNOSIS — Z7982 Long term (current) use of aspirin: Secondary | ICD-10-CM | POA: Diagnosis not present

## 2015-06-28 DIAGNOSIS — Z7984 Long term (current) use of oral hypoglycemic drugs: Secondary | ICD-10-CM | POA: Insufficient documentation

## 2015-06-28 DIAGNOSIS — K219 Gastro-esophageal reflux disease without esophagitis: Secondary | ICD-10-CM | POA: Insufficient documentation

## 2015-06-28 DIAGNOSIS — F329 Major depressive disorder, single episode, unspecified: Secondary | ICD-10-CM | POA: Diagnosis not present

## 2015-06-28 DIAGNOSIS — I10 Essential (primary) hypertension: Secondary | ICD-10-CM | POA: Insufficient documentation

## 2015-06-28 DIAGNOSIS — E785 Hyperlipidemia, unspecified: Secondary | ICD-10-CM | POA: Diagnosis not present

## 2015-06-28 HISTORY — PX: HOLMIUM LASER APPLICATION: SHX5852

## 2015-06-28 HISTORY — PX: CYSTOSCOPY/RETROGRADE/URETEROSCOPY/STONE EXTRACTION WITH BASKET: SHX5317

## 2015-06-28 LAB — GLUCOSE, CAPILLARY: Glucose-Capillary: 109 mg/dL — ABNORMAL HIGH (ref 65–99)

## 2015-06-28 SURGERY — CYSTOSCOPY, WITH CALCULUS REMOVAL USING BASKET
Anesthesia: General | Laterality: Right

## 2015-06-28 MED ORDER — OXYBUTYNIN CHLORIDE 5 MG PO TABS: 5.0000 mg | ORAL_TABLET | Freq: Three times a day (TID) | ORAL | Status: DC | PRN

## 2015-06-28 MED ORDER — FENTANYL CITRATE (PF) 100 MCG/2ML IJ SOLN
25.0000 ug | INTRAMUSCULAR | Status: AC
  Administered 2015-06-28: 25 ug via INTRAVENOUS

## 2015-06-28 MED ORDER — FENTANYL CITRATE (PF) 100 MCG/2ML IJ SOLN
INTRAMUSCULAR | Status: AC
  Filled 2015-06-28: qty 2

## 2015-06-28 MED ORDER — STERILE WATER FOR IRRIGATION IR SOLN
Status: DC | PRN
  Administered 2015-06-28: 1000 mL

## 2015-06-28 MED ORDER — FENTANYL CITRATE (PF) 100 MCG/2ML IJ SOLN
INTRAMUSCULAR | Status: DC | PRN
  Administered 2015-06-28: 50 ug via INTRAVENOUS
  Administered 2015-06-28 (×2): 25 ug via INTRAVENOUS

## 2015-06-28 MED ORDER — MIDAZOLAM HCL 2 MG/2ML IJ SOLN
1.0000 mg | INTRAMUSCULAR | Status: DC | PRN
  Administered 2015-06-28: 2 mg via INTRAVENOUS

## 2015-06-28 MED ORDER — FENTANYL CITRATE (PF) 100 MCG/2ML IJ SOLN: 25.0000 ug | INTRAMUSCULAR | Status: DC | PRN

## 2015-06-28 MED ORDER — PROPOFOL 10 MG/ML IV BOLUS
INTRAVENOUS | Status: DC | PRN
  Administered 2015-06-28: 150 mg via INTRAVENOUS

## 2015-06-28 MED ORDER — MIDAZOLAM HCL 2 MG/2ML IJ SOLN
INTRAMUSCULAR | Status: AC
  Filled 2015-06-28: qty 2

## 2015-06-28 MED ORDER — ONDANSETRON HCL 4 MG/2ML IJ SOLN
4.0000 mg | Freq: Once | INTRAMUSCULAR | Status: AC
  Administered 2015-06-28: 4 mg via INTRAVENOUS

## 2015-06-28 MED ORDER — EPHEDRINE SULFATE 50 MG/ML IJ SOLN
INTRAMUSCULAR | Status: AC
  Filled 2015-06-28: qty 1

## 2015-06-28 MED ORDER — ONDANSETRON HCL 4 MG/2ML IJ SOLN: 4.0000 mg | Freq: Once | INTRAMUSCULAR | Status: DC | PRN

## 2015-06-28 MED ORDER — ONDANSETRON HCL 4 MG/2ML IJ SOLN
INTRAMUSCULAR | Status: AC
  Filled 2015-06-28: qty 2

## 2015-06-28 MED ORDER — IOHEXOL 350 MG/ML SOLN
INTRAVENOUS | Status: DC | PRN
  Administered 2015-06-28: 50 mL

## 2015-06-28 MED ORDER — CIPROFLOXACIN IN D5W 400 MG/200ML IV SOLN
400.0000 mg | INTRAVENOUS | Status: AC
  Administered 2015-06-28: 400 mg via INTRAVENOUS
  Filled 2015-06-28: qty 200

## 2015-06-28 MED ORDER — EPHEDRINE SULFATE 50 MG/ML IJ SOLN
INTRAMUSCULAR | Status: DC | PRN
  Administered 2015-06-28: 5 mg via INTRAVENOUS
  Administered 2015-06-28: 10 mg via INTRAVENOUS

## 2015-06-28 MED ORDER — MIDAZOLAM HCL 2 MG/2ML IJ SOLN: 1.0000 mg | INTRAMUSCULAR | Status: DC | PRN

## 2015-06-28 MED ORDER — HYDROCODONE-ACETAMINOPHEN 5-325 MG PO TABS: 1.0000 | ORAL_TABLET | ORAL | Status: DC | PRN

## 2015-06-28 MED ORDER — SODIUM CHLORIDE 0.9 % IJ SOLN
INTRAMUSCULAR | Status: AC
  Filled 2015-06-28: qty 10

## 2015-06-28 MED ORDER — LIDOCAINE HCL (PF) 1 % IJ SOLN
INTRAMUSCULAR | Status: AC
Start: 2015-06-28 — End: 2015-06-28
  Filled 2015-06-28: qty 5

## 2015-06-28 MED ORDER — SODIUM CHLORIDE 0.9 % IR SOLN
Status: DC | PRN
  Administered 2015-06-28 (×2): 3000 mL

## 2015-06-28 MED ORDER — LIDOCAINE HCL (CARDIAC) 20 MG/ML IV SOLN
INTRAVENOUS | Status: DC | PRN
  Administered 2015-06-28: 30 mg via INTRATRACHEAL

## 2015-06-28 MED ORDER — PROPOFOL 10 MG/ML IV BOLUS
INTRAVENOUS | Status: AC
  Filled 2015-06-28: qty 20

## 2015-06-28 MED ORDER — LACTATED RINGERS IV SOLN
INTRAVENOUS | Status: DC
  Administered 2015-06-28 (×2): via INTRAVENOUS

## 2015-06-28 MED ORDER — SULFAMETHOXAZOLE-TRIMETHOPRIM 800-160 MG PO TABS: 1.0000 | ORAL_TABLET | Freq: Two times a day (BID) | ORAL | Status: DC

## 2015-06-28 SURGICAL SUPPLY — 29 items
ADAPTER CATH URET PLST 4-6FR (CATHETERS) IMPLANT
ADPR CATH URET STRL DISP 4-6FR (CATHETERS)
BAG DRAIN URO TABLE W/ADPT NS (DRAPE) ×2 IMPLANT
BAG DRN 8 ADPR NS SKTRN CSTL (DRAPE) ×1
BAG HAMPER (MISCELLANEOUS) ×2 IMPLANT
BASKET STNLS GEMINI 4WIRE 3FR (BASKET) IMPLANT
BASKET ZERO TIP NITINOL 2.4FR (BASKET) IMPLANT
BSKT STON RTRVL GEM 120X11 3FR (BASKET)
BSKT STON RTRVL ZERO TP 2.4FR (BASKET)
CANISTER SUCT LVC 12 LTR MEDI- (MISCELLANEOUS) ×1 IMPLANT
CATH INTERMIT  6FR 70CM (CATHETERS) ×1 IMPLANT
CLOTH BEACON ORANGE TIMEOUT ST (SAFETY) ×2 IMPLANT
DRAPE CAMERA CLOSED 9X96 (DRAPES) ×1 IMPLANT
EXTRACTOR STONE NITINOL NGAGE (UROLOGICAL SUPPLIES) ×1 IMPLANT
GLOVE BIOGEL M 8.0 STRL (GLOVE) ×2 IMPLANT
GOWN PREVENTION PLUS LG XLONG (DISPOSABLE) ×2 IMPLANT
GOWN STRL REIN XL XLG (GOWN DISPOSABLE) ×2 IMPLANT
GUIDEWIRE 0.038 PTFE COATED (WIRE) IMPLANT
GUIDEWIRE ANG ZIPWIRE 038X150 (WIRE) IMPLANT
GUIDEWIRE STR DUAL SENSOR (WIRE) ×1 IMPLANT
IV NS IRRIG 3000ML ARTHROMATIC (IV SOLUTION) ×3 IMPLANT
LASER FIBER DISP (UROLOGICAL SUPPLIES) ×4 IMPLANT
PACK CYSTO (CUSTOM PROCEDURE TRAY) ×2 IMPLANT
PAD ARMBOARD 7.5X6 YLW CONV (MISCELLANEOUS) ×2 IMPLANT
SHEATH ACCESS URETERAL 38CM (SHEATH) ×1 IMPLANT
SHEATH URET ACCESS 12FR/35CM (UROLOGICAL SUPPLIES) IMPLANT
STENT URET 6FRX24 CONTOUR (STENTS) ×1 IMPLANT
TOWEL OR 17X26 4PK STRL BLUE (TOWEL DISPOSABLE) ×2 IMPLANT
WATER STERILE IRR 1000ML POUR (IV SOLUTION) ×1 IMPLANT

## 2015-06-28 NOTE — Anesthesia Postprocedure Evaluation (Signed)
Anesthesia Post Note  Patient: Cheryl Abbott  Procedure(s) Performed: Procedure(s) (LRB): CYSTOSCOPY/RETROGRADE/URETEROSCOPY/STONE EXTRACTION WITH BASKET, RIGHT URETERAL DOUBLE J STENT PLACEMENT (Right) HOLMIUM LASER APPLICATION (Right)  Patient location during evaluation: PACU Anesthesia Type: General Level of consciousness: awake and alert Pain management: pain level controlled Vital Signs Assessment: post-procedure vital signs reviewed and stable Respiratory status: spontaneous breathing Cardiovascular status: blood pressure returned to baseline and stable Postop Assessment: adequate PO intake Anesthetic complications: no    Last Vitals:  Filed Vitals:   06/28/15 1245 06/28/15 1455  BP: 126/73   Pulse:    Temp:  36.8 C  Resp: 18     Last Pain: There were no vitals filed for this visit.               Carlton Buskey

## 2015-06-28 NOTE — Anesthesia Procedure Notes (Signed)
Procedure Name: LMA Insertion Date/Time: 06/28/2015 12:56 PM Performed by: Tressie Stalker E Pre-anesthesia Checklist: Patient identified, Patient being monitored, Emergency Drugs available, Timeout performed and Suction available Patient Re-evaluated:Patient Re-evaluated prior to inductionOxygen Delivery Method: Circle System Utilized Preoxygenation: Pre-oxygenation with 100% oxygen Intubation Type: IV induction Ventilation: Mask ventilation without difficulty LMA: LMA inserted LMA Size: 4.0 Number of attempts: 1 Placement Confirmation: positive ETCO2 and breath sounds checked- equal and bilateral

## 2015-06-28 NOTE — Op Note (Signed)
Preoperative diagnosis: Right renal calculi  Postoperative diagnosis: Same  Procedure:  1. Cystoscopy 2. Right retrograde ureteropyelogram with interpretive fluoroscopy 3. Right ureteroscopy with holmium laser application and extraction of right renal calculi  4. Placement of double-J stent and right kidney-24 cm x 6 French  Surgeon: Lillette Boxer. Laurence Crofford,  M.D.  Anesthesia: General  ComplicationIntraoperative findings: Rightrade pyelography demonstratedfilling defectsin the right renal pelvis/calyceal system  consistent with the patient's known calculus without other abnormalities noted.  EBL: Minimal  Specimens: 1. Right renal stone fragments  Disposition of specimens: [facility] for stone analysis  Indication: Cheryl Abbott  is a 70 y.o. patient with urolithiasis. After reviewing the management options for treatment, he elected to proceed with the above surgical procedure(s). We have discussed the potential benefits and risks of the procedure, side effects of the proposed treatment, the likelihood of the patient achieving the goals of the procedure, and any potential problems that might occur during the procedure or recuperation. Informed consent has been obtained.  Description of procedure:  The patient was taken to the operating room and general anesthesia was induced.  The patient was placed in the dorsal lithotomy position, prepped and draped in the usual sterile fashion, and preoperative antibiotics were administered. A preoperative time-out was performed.   Cystourethroscopy was performed.  The patient's urethra was examined and was normal. The bladder was then circumferentially examined. There was no evidence of tumors, trabeculations or foreign bodies.  Attention then turned to thrie ght ureteral orifice and a ureteral catheter was used to intubate the ureteral orifice.  Omnipaque contrast was injected through the ureteral catheter and a retrograde  pyelogram was performed with findings as dictated above.  A 0.38 sensor guidewire was then advanced up the right ureter into the renal pelvis under fluoroscopic guidance.  A 12/14 Fr ureteral access sheath was then advance over the guide wire. The digital flexible ureteroscope was then advanced through the access sheath into the ureter next to the guidewire and the calculi were noted in the right lower pole calyces as well as an interpole calyx. The larger one was in the interpole calyx.   The stone was then fragmented with the 200 micron holmium laser fiber on a setting of  0.8 J with a frequency of 30 Hz . Multiple larger fragments were obtained, as well as multiple sand-like fragments that I felt were too small to grasp. Over a period of an hour or so, following fragmentation of the interpolar and lower pole are stones, the stone fragments were grasped with the [place] basket, extracted through the access sheath.  Following removal of the larger fragments, multiple tiny fragments were seen. These were felt to be small enough in easy enough to pass along the ureteral stent and were left indwelling.   The safety wire was then replaced and the access sheath removed.  The guidewire was backloaded through the cystoscope and a ureteral stent was advance over the wire using Seldinger technique.  The stent was positioned appropriately under fluoroscopic and cystoscopic guidance.  The wire was then removed with an adequate stent curl noted in the renal pelvis as well as in the bladder.  The bladder was then emptied and the procedure ended.  The patient appeared to tolerate the procedure well and without complications.  The patient was able to be awakened and transferred to the recovery unit in satisfactory condition.

## 2015-06-28 NOTE — Transfer of Care (Signed)
Immediate Anesthesia Transfer of Care Note  Patient: Cheryl Abbott  Procedure(s) Performed: Procedure(s): CYSTOSCOPY/RETROGRADE/URETEROSCOPY/STONE EXTRACTION WITH BASKET, RIGHT URETERAL DOUBLE J STENT PLACEMENT (Right) HOLMIUM LASER APPLICATION (Right)  Patient Location: PACU  Anesthesia Type:General  Level of Consciousness: awake  Airway & Oxygen Therapy: Patient Spontanous Breathing and Patient connected to face mask oxygen  Post-op Assessment: Report given to RN  Post vital signs: Reviewed and stable  Last Vitals:  Filed Vitals:   06/28/15 1240 06/28/15 1245  BP: 123/79 126/73  Pulse:    Temp:    Resp: 20 18    Complications: No apparent anesthesia complications

## 2015-06-28 NOTE — Anesthesia Preprocedure Evaluation (Signed)
Anesthesia Evaluation  Patient identified by MRN, date of birth, ID band Patient awake    Reviewed: Allergy & Precautions, NPO status , Patient's Chart, lab work & pertinent test results  History of Anesthesia Complications Negative for: history of anesthetic complications  Airway Mallampati: III  TM Distance: >3 FB     Dental  (+) Teeth Intact   Pulmonary neg pulmonary ROS, sleep apnea , former smoker,    Pulmonary exam normal        Cardiovascular Exercise Tolerance: Poor hypertension, Pt. on medications Normal cardiovascular exam     Neuro/Psych  Headaches, PSYCHIATRIC DISORDERS Anxiety Depression  Neuromuscular disease negative neurological ROS     GI/Hepatic GERD  Medicated,  Endo/Other  diabetes, Well Controlled, Type 2  Renal/GU      Musculoskeletal negative musculoskeletal ROS (+)   Abdominal Normal abdominal exam  (+)   Peds  Hematology negative hematology ROS (+)   Anesthesia Other Findings   Reproductive/Obstetrics negative OB ROS                             Anesthesia Physical Anesthesia Plan  ASA: III  Anesthesia Plan: General   Post-op Pain Management:    Induction: Intravenous  Airway Management Planned:   Additional Equipment:   Intra-op Plan:   Post-operative Plan:   Informed Consent:   Plan Discussed with:   Anesthesia Plan Comments:         Anesthesia Quick Evaluation

## 2015-06-28 NOTE — H&P (Signed)
H&P  Chief Complaint: Right sided kidney stones  History of Present Illness: Cheryl Abbott is a 70 y.o. year old female who presents for ureteroscopic management of a 12 mm right renal pelvic stone as well as smaller right LP stones.  Past Medical History  Diagnosis Date  . Anxiety disorder   . Hyperlipidemia   . Hypertension   . Depression   . Chronic back pain   . Headache   . Type 2 diabetes mellitus ([place])   . GERD (gastroesophageal reflux disease)   . Anemia   . History of adenomatous polyp of colon     2008  . Fatty liver   . Sigmoid diverticulosis   . Left ureteral stone   . History of kidney stones   . Arthritis   . Hypercalcemia   . OSA treated with BiPAP     per study 2007  . Nephrolithiasis   . Sciatica   . Lumbar disc disease with radiculopathy   . Neuropathy, peripheral [facility])     Past Surgical History  Procedure Laterality Date  . Flexible sigmoidoscopy  09/11/2011    [id number] Internal hemorrhoids  . Esophagogastroduodenoscopy (egd) with propofol N/A 03/17/2013    Procedure: ESOPHAGOGASTRODUODENOSCOPY (EGD) WITH PROPOFOL;  Surgeon: Danie Binder, MD;  Location: [facility];  Service: Endoscopy;  Laterality: N/A;  . Savory dilation N/A 03/17/2013    Procedure: SAVORY DILATION;  Surgeon: Danie Binder, MD;  Location: [facility];  Service: Endoscopy;  Laterality: N/A;  #12.8, 14, 15, 16 dilators used  . Esophageal biopsy N/A 03/17/2013    Procedure: GASTRIC BIOPSIES;  Surgeon: Danie Binder, MD;  Location: [facility];  Service: Endoscopy;  Laterality: N/A;  . Polypectomy N/A 03/17/2013    Procedure: GASTRIC POLYPECTOMY;  Surgeon: Danie Binder, MD;  Location: [facility];  Service: Endoscopy;  Laterality: N/A;  . Colonoscopy  last one 10/02/2011    [facility] [place] [facility], IH, NEXT TCS APR 2018  . Cysto/   right ureterosocopy laser lithotripsy stone extraction  10-13-2004  . Cysto/  right retrograde pyelogram/  placment right ureteral stent  01-10-2010  . Percutaneous  nephrostolithotomy Bilateral 12/  2011     [facility]  . Cardiovascular stress test  01-01-2014    normal lexiscan cardiolite/  no ischemia/ infarct/  normal LV function and wall motion ,  ef 81%  . Transthoracic echocardiogram  01-01-2014    mild LVH/  ef [id number]  grade I diastolic dysfunction/  trivial MR, TR, and PR  . Removal right thigh cyst  2006  . Vaginal hysterectomy  1970's  . Cystoscopy with retrograde pyelogram, ureteroscopy and stent placement Left 10/21/2014    Procedure: CYSTOSCOPY WITH RETROGRADE PYELOGRAM, URETEROSCOPY AND STENT PLACEMENT;  Surgeon: Franchot Gallo, MD;  Location: [facility];  Service: Urology;  Laterality: Left;  . Holmium laser application Left [id number]    Procedure: HOLMIUM LASER APPLICATION;  Surgeon: Franchot Gallo, MD;  Location: [facility];  Service: Urology;  Laterality: Left;  Marland Kitchen Mass excision Left 03/04/2015    Procedure: EXCISION OF SOFT TISSUE NEOPLASM LEFT ARM;  Surgeon: Aviva Signs, MD;  Location: [facility];  Service: General;  Laterality: Left;  . Esophagogastroduodenoscopy (egd) with propofol N/A 06/10/2015    Procedure: ESOPHAGOGASTRODUODENOSCOPY (EGD) WITH PROPOFOL;  Surgeon: Danie Binder, MD;  [facility];  Service: Endoscopy;  Laterality: N/A;  1245 - pt knows to arrive at 8:30  . Savory dilation N/A 06/10/2015  Procedure: SAVORY DILATION;  Surgeon: Danie Binder, MD;  [facility];  Service: Endoscopy;  Laterality: N/A;  . Esophageal biopsy  06/10/2015    Procedure: BIOPSY;  Surgeon: Danie Binder, MD;  [facility];  Service: Endoscopy;;  gastric biopsy  . Cardiac catheterization  05-11-2003  dr Shelva Majestic ([facility])    Abnormal cardiolite/   Normal coronary arteries and normal LVF,  ef  63%    Home Medications:  No prescriptions prior to admission    Allergies:  Allergies  Allergen Reactions  . Ace Inhibitors Cough  . Keflex [Cephalexin]  Diarrhea and Nausea And Vomiting  . Nitrofurantoin Diarrhea and Nausea And Vomiting  . Penicillins Hives, Rash and Other (See Comments)    Blisters on hands and feet    Family History  Problem Relation Age of Onset  . Hypertension Mother   . Diabetes Mother   . Heart failure Mother   . Dementia Mother   . Emphysema Father   . Hypertension Father   . Diabetes Brother   . GER disease Brother   . Cancer      family history   . Diabetes      family history   . Heart defect      famiily history   . Arthritis      family history   . Anesthesia problems Neg Hx   . Hypotension Neg Hx   . Malignant hyperthermia Neg Hx   . Pseudochol deficiency Neg Hx   . Colon cancer Neg Hx   . Hypertension Sister   . Hypertension Sister     Social History:  reports that she quit smoking about 20 years ago. Her smoking use included Cigarettes. She has a 20 pack-year smoking history. She has never used smokeless tobacco. She reports that she does not drink alcohol or use illicit drugs.  ROS: A complete review of systems was performed.  All systems are negative except for pertinent findings as noted.  Physical Exam:  Vital signs in last 24 hours:   General:  Alert and oriented, No acute distress HEENT: Normocephalic, atraumatic Neck: No JVD or lymphadenopathy Cardiovascular: Regular rate and rhythm Lungs: Clear bilaterally Abdomen: Soft, nontender, nondistended, no abdominal masses Back: No CVA tenderness Extremities: No edema Neurologic: Grossly intact  Laboratory Data:  No results found for this or any previous visit (from the past 24 hour(s)). No results found for this or any previous visit (from the past 240 hour(s)). Creatinine: No results for input(s): CREATININE in the last 168 hours.  Radiologic Imaging: No results found.  Impression/Assessment:  12 mm right renal pelvic, smaller right lower pole renal cacluli  Plan:  Cysto, right RGP, right ureteroscopy wit lase,  extraction of stones, right J2 stent  Jorja Loa 06/28/2015, 8:04 AM  Lillette Boxer. Madhav Mohon MD

## 2015-06-28 NOTE — Discharge Instructions (Signed)
POSTOPERATIVE CARE AFTER URETEROSCOPY  Stent management  *Stents are often left in after ureteroscopy and stone treatment. If left in, they often cause urinary frequency, urgency, occasional blood in the urine, as well as flank discomfort with urination. These are all expected issues, and should resolve after the stent is removed. *Often times, a small thread is left on the end of the stent, and brought out through the urethra. If so, this is used to remove the stent, making it unnecessary to look in the bladder with a scope in the office to remove the stent. If a thread is left on, did not pull on it until instructed.  Diet  Once you have adequately recovered from anesthesia, you may gradually advance your diet, as tolerated, to your regular diet.  Activities  You may gradually increase your activities to your normal unrestricted level the day following your procedure.  Medications  You should resume all preoperative medications. If you are on aspirin-like compounds, you should not resume these until the blood clears from your urine. If given an antibiotic by the surgeon, take these until they are completed. You may also be given, if you have a stent, medications to decrease the urinary frequency and urgency.  Pain  After ureteroscopy, there may be some pain on the side of the scope. Take your pain medicine for this. Usually, this pain resolves within a day or 2.  Fever  Please report any fever over 100 to the doctor.

## 2015-06-29 ENCOUNTER — Encounter (HOSPITAL_COMMUNITY): Payer: Self-pay | Admitting: Urology

## 2015-06-29 LAB — GLUCOSE, CAPILLARY: Glucose-Capillary: 77 mg/dL (ref 65–99)

## 2015-07-04 ENCOUNTER — Other Ambulatory Visit: Payer: Self-pay | Admitting: Family Medicine

## 2015-07-09 ENCOUNTER — Other Ambulatory Visit: Payer: Self-pay | Admitting: Family Medicine

## 2015-07-10 ENCOUNTER — Emergency Department (HOSPITAL_COMMUNITY)
Admission: EM | Admit: 2015-07-10 | Discharge: 2015-07-10 | Disposition: A | Discharge status: Home / Self Care | Payer: Medicare Other | Source: Home / Self Care | Attending: Emergency Medicine | Admitting: Emergency Medicine

## 2015-07-10 ENCOUNTER — Emergency Department (HOSPITAL_COMMUNITY): Payer: Medicare Other

## 2015-07-10 ENCOUNTER — Encounter (HOSPITAL_COMMUNITY): Payer: Self-pay

## 2015-07-10 DIAGNOSIS — Z7982 Long term (current) use of aspirin: Secondary | ICD-10-CM

## 2015-07-10 DIAGNOSIS — I10 Essential (primary) hypertension: Secondary | ICD-10-CM

## 2015-07-10 DIAGNOSIS — F419 Anxiety disorder, unspecified: Secondary | ICD-10-CM

## 2015-07-10 DIAGNOSIS — G4733 Obstructive sleep apnea (adult) (pediatric): Secondary | ICD-10-CM | POA: Insufficient documentation

## 2015-07-10 DIAGNOSIS — M1990 Unspecified osteoarthritis, unspecified site: Secondary | ICD-10-CM | POA: Insufficient documentation

## 2015-07-10 DIAGNOSIS — F329 Major depressive disorder, single episode, unspecified: Secondary | ICD-10-CM | POA: Insufficient documentation

## 2015-07-10 DIAGNOSIS — Z862 Personal history of diseases of the blood and blood-forming organs and certain disorders involving the immune mechanism: Secondary | ICD-10-CM

## 2015-07-10 DIAGNOSIS — Z86010 Personal history of colonic polyps: Secondary | ICD-10-CM | POA: Insufficient documentation

## 2015-07-10 DIAGNOSIS — E1121 Type 2 diabetes mellitus with diabetic nephropathy: Secondary | ICD-10-CM | POA: Diagnosis not present

## 2015-07-10 DIAGNOSIS — R1033 Periumbilical pain: Secondary | ICD-10-CM | POA: Diagnosis not present

## 2015-07-10 DIAGNOSIS — E785 Hyperlipidemia, unspecified: Secondary | ICD-10-CM | POA: Insufficient documentation

## 2015-07-10 DIAGNOSIS — Z792 Long term (current) use of antibiotics: Secondary | ICD-10-CM | POA: Insufficient documentation

## 2015-07-10 DIAGNOSIS — Z87891 Personal history of nicotine dependence: Secondary | ICD-10-CM | POA: Insufficient documentation

## 2015-07-10 DIAGNOSIS — Z794 Long term (current) use of insulin: Secondary | ICD-10-CM | POA: Diagnosis not present

## 2015-07-10 DIAGNOSIS — Z7984 Long term (current) use of oral hypoglycemic drugs: Secondary | ICD-10-CM | POA: Insufficient documentation

## 2015-07-10 DIAGNOSIS — Z888 Allergy status to other drugs, medicaments and biological substances status: Secondary | ICD-10-CM | POA: Diagnosis not present

## 2015-07-10 DIAGNOSIS — Z880 Allergy status to penicillin: Secondary | ICD-10-CM

## 2015-07-10 DIAGNOSIS — G8929 Other chronic pain: Secondary | ICD-10-CM

## 2015-07-10 DIAGNOSIS — N200 Calculus of kidney: Secondary | ICD-10-CM | POA: Diagnosis not present

## 2015-07-10 DIAGNOSIS — Z87442 Personal history of urinary calculi: Secondary | ICD-10-CM

## 2015-07-10 DIAGNOSIS — N183 Chronic kidney disease, stage 3 (moderate): Secondary | ICD-10-CM | POA: Diagnosis not present

## 2015-07-10 DIAGNOSIS — E1122 Type 2 diabetes mellitus with diabetic chronic kidney disease: Secondary | ICD-10-CM | POA: Diagnosis not present

## 2015-07-10 DIAGNOSIS — E119 Type 2 diabetes mellitus without complications: Secondary | ICD-10-CM

## 2015-07-10 DIAGNOSIS — Z9889 Other specified postprocedural states: Secondary | ICD-10-CM | POA: Insufficient documentation

## 2015-07-10 DIAGNOSIS — Z79899 Other long term (current) drug therapy: Secondary | ICD-10-CM

## 2015-07-10 DIAGNOSIS — N1330 Unspecified hydronephrosis: Secondary | ICD-10-CM | POA: Diagnosis not present

## 2015-07-10 DIAGNOSIS — N390 Urinary tract infection, site not specified: Secondary | ICD-10-CM | POA: Insufficient documentation

## 2015-07-10 DIAGNOSIS — I129 Hypertensive chronic kidney disease with stage 1 through stage 4 chronic kidney disease, or unspecified chronic kidney disease: Secondary | ICD-10-CM | POA: Diagnosis not present

## 2015-07-10 DIAGNOSIS — G629 Polyneuropathy, unspecified: Secondary | ICD-10-CM | POA: Insufficient documentation

## 2015-07-10 DIAGNOSIS — N202 Calculus of kidney with calculus of ureter: Secondary | ICD-10-CM | POA: Diagnosis not present

## 2015-07-10 DIAGNOSIS — R109 Unspecified abdominal pain: Secondary | ICD-10-CM

## 2015-07-10 DIAGNOSIS — Z881 Allergy status to other antibiotic agents status: Secondary | ICD-10-CM | POA: Diagnosis not present

## 2015-07-10 DIAGNOSIS — Z936 Other artificial openings of urinary tract status: Secondary | ICD-10-CM | POA: Diagnosis not present

## 2015-07-10 DIAGNOSIS — K219 Gastro-esophageal reflux disease without esophagitis: Secondary | ICD-10-CM

## 2015-07-10 LAB — CBC WITH DIFFERENTIAL/PLATELET
Basophils Absolute: 0 10*3/uL (ref 0.0–0.1)
Basophils Relative: 0 %
Eosinophils Absolute: 0.4 10*3/uL (ref 0.0–0.7)
Eosinophils Relative: 4 %
HCT: 31.9 % — ABNORMAL LOW (ref 36.0–46.0)
Hemoglobin: 9.6 g/dL — ABNORMAL LOW (ref 12.0–15.0)
Lymphocytes Relative: 24 %
Lymphs Abs: 2.4 10*3/uL (ref 0.7–4.0)
MCH: 23.4 pg — ABNORMAL LOW (ref 26.0–34.0)
MCHC: 30.1 g/dL (ref 30.0–36.0)
MCV: 77.6 fL — ABNORMAL LOW (ref 78.0–100.0)
Monocytes Absolute: 0.9 10*3/uL (ref 0.1–1.0)
Monocytes Relative: 9 %
Neutro Abs: 6.4 10*3/uL (ref 1.7–7.7)
Neutrophils Relative %: 63 %
Platelets: 514 10*3/uL — ABNORMAL HIGH (ref 150–400)
RBC: 4.11 MIL/uL (ref 3.87–5.11)
RDW: 17.9 % — ABNORMAL HIGH (ref 11.5–15.5)
WBC: 10.1 10*3/uL (ref 4.0–10.5)

## 2015-07-10 LAB — URINALYSIS, ROUTINE W REFLEX MICROSCOPIC
Glucose, UA: 100 mg/dL — AB
Nitrite: POSITIVE — AB
Protein, ur: >300 mg/dL — AB
Specific Gravity, Urine: >1.03 — ABNORMAL HIGH (ref 1.005–1.030)
pH: 5.5 (ref 5.0–8.0)

## 2015-07-10 LAB — COMPREHENSIVE METABOLIC PANEL
ALT: 18 U/L (ref 14–54)
AST: 39 U/L (ref 15–41)
Albumin: 3.9 g/dL (ref 3.5–5.0)
Alkaline Phosphatase: 72 U/L (ref 38–126)
Anion gap: 10 (ref 5–15)
BUN: 21 mg/dL — ABNORMAL HIGH (ref 6–20)
CO2: 22 mmol/L (ref 22–32)
Calcium: 10.3 mg/dL (ref 8.9–10.3)
Chloride: 103 mmol/L (ref 101–111)
Creatinine, Ser: 1.62 mg/dL — ABNORMAL HIGH (ref 0.44–1.00)
GFR calc Af Amer: 36 mL/min — ABNORMAL LOW (ref >60)
GFR calc non Af Amer: 31 mL/min — ABNORMAL LOW (ref >60)
Glucose, Bld: 147 mg/dL — ABNORMAL HIGH (ref 65–99)
Potassium: 4.2 mmol/L (ref 3.5–5.1)
Sodium: 135 mmol/L (ref 135–145)
Total Bilirubin: 0.4 mg/dL (ref 0.3–1.2)
Total Protein: 8.5 g/dL — ABNORMAL HIGH (ref 6.5–8.1)

## 2015-07-10 LAB — URINE MICROSCOPIC-ADD ON

## 2015-07-10 MED ORDER — CIPROFLOXACIN IN D5W 400 MG/200ML IV SOLN
400.0000 mg | Freq: Once | INTRAVENOUS | Status: AC
  Administered 2015-07-10: 400 mg via INTRAVENOUS
  Filled 2015-07-10: qty 200

## 2015-07-10 MED ORDER — HYDROCODONE-ACETAMINOPHEN 5-325 MG PO TABS: 1.0000 | ORAL_TABLET | Freq: Four times a day (QID) | ORAL | Status: DC | PRN

## 2015-07-10 MED ORDER — SODIUM CHLORIDE 0.9 % IV BOLUS (SEPSIS)
500.0000 mL | Freq: Once | INTRAVENOUS | Status: AC
  Administered 2015-07-10: 500 mL via INTRAVENOUS

## 2015-07-10 MED ORDER — HYDROMORPHONE HCL 1 MG/ML IJ SOLN
1.0000 mg | Freq: Once | INTRAMUSCULAR | Status: AC
  Administered 2015-07-10: 1 mg via INTRAMUSCULAR
  Filled 2015-07-10: qty 1

## 2015-07-10 MED ORDER — CIPROFLOXACIN HCL 500 MG PO TABS: 500.0000 mg | ORAL_TABLET | Freq: Two times a day (BID) | ORAL | Status: DC

## 2015-07-10 NOTE — ED Provider Notes (Signed)
CSN: [id number]     Arrival date & time 07/10/15  1837 History   First MD Initiated Contact with Patient 07/10/15 1911     Chief Complaint  Patient presents with  . Abdominal Pain     (Consider location/radiation/quality/duration/timing/severity/associated sxs/prior Treatment) Patient is a 70 y.o. female presenting with abdominal pain. The history is provided by the patient (Patient complains of pain over her suprapubic area for a few days now. She recently had a stent placed in the ureter on January 17).  Abdominal Pain Pain location:  Suprapubic Pain quality: aching   Pain radiates to:  Does not radiate Pain severity:  Mild Onset quality:  Gradual Timing:  Constant Progression:  Worsening Chronicity:  Recurrent Context: alcohol use   Associated symptoms: no chest pain, no cough, no diarrhea, no fatigue and no hematuria     Past Medical History  Diagnosis Date  . Anxiety disorder   . Hyperlipidemia   . Hypertension   . Depression   . Chronic back pain   . Headache   . Type 2 diabetes mellitus ([place])   . GERD (gastroesophageal reflux disease)   . Anemia   . History of adenomatous polyp of colon     2008  . Fatty liver   . Sigmoid diverticulosis   . Left ureteral stone   . History of kidney stones   . Arthritis   . Hypercalcemia   . OSA treated with BiPAP     per study 2007  . Nephrolithiasis   . Sciatica   . Lumbar disc disease with radiculopathy   . Neuropathy, peripheral [facility])    Past Surgical History  Procedure Laterality Date  . Flexible sigmoidoscopy  09/11/2011    [id number] Internal hemorrhoids  . Esophagogastroduodenoscopy (egd) with propofol N/A 03/17/2013    Procedure: ESOPHAGOGASTRODUODENOSCOPY (EGD) WITH PROPOFOL;  Surgeon: Danie Binder, MD;  Location: [facility];  Service: Endoscopy;  Laterality: N/A;  . Savory dilation N/A 03/17/2013    Procedure: SAVORY DILATION;  Surgeon: Danie Binder, MD;  Location: [facility];  Service: Endoscopy;  Laterality: N/A;   #12.8, 14, 15, 16 dilators used  . Biopsy N/A 03/17/2013    Procedure: GASTRIC BIOPSIES;  Surgeon: Danie Binder, MD;  Location: [facility];  Service: Endoscopy;  Laterality: N/A;  . Polypectomy N/A 03/17/2013    Procedure: GASTRIC POLYPECTOMY;  Surgeon: Danie Binder, MD;  Location: [facility];  Service: Endoscopy;  Laterality: N/A;  . Colonoscopy  last one 10/02/2011    MOD [place] T[facility], IH, NEXT TCS APR 2018  . Cysto/   right ureterosocopy laser lithotripsy stone extraction  10-13-2004  . Cysto/  right retrograde pyelogram/  placment right ureteral stent  01-10-2010  . Percutaneous nephrostolithotomy Bilateral 12/  2011     [facility]  . Cardiovascular stress test  01-01-2014    normal lexiscan cardiolite/  no ischemia/ infarct/  normal LV function and wall motion ,  ef 81%  . Transthoracic echocardiogram  01-01-2014    mild LVH/  ef 55-73%/  grade I diastolic dysfunction/  trivial MR, TR, and PR  . Removal right thigh cyst  2006  . Vaginal hysterectomy  1970's  . Cystoscopy with retrograde pyelogram, ureteroscopy and stent placement Left 10/21/2014    Procedure: CYSTOSCOPY WITH RETROGRADE PYELOGRAM, URETEROSCOPY AND STENT PLACEMENT;  Surgeon: Franchot Gallo, MD;  Location: [facility];  Service: Urology;  Laterality: Left;  . Holmium laser application Left 07/31/2540    Procedure: HOLMIUM LASER  APPLICATION;  Surgeon: Franchot Gallo, MD;  Location: [facility];  Service: Urology;  Laterality: Left;  Marland Kitchen Mass excision Left 03/04/2015    Procedure: EXCISION OF SOFT TISSUE NEOPLASM LEFT ARM;  Surgeon: Aviva Signs, MD;  Location: [facility];  Service: General;  Laterality: Left;  . Esophagogastroduodenoscopy (egd) with propofol N/A 06/10/2015    Procedure: ESOPHAGOGASTRODUODENOSCOPY (EGD) WITH PROPOFOL;  Surgeon: Danie Binder, MD;  [facility];  Service: Endoscopy;  Laterality: N/A;  1245 - pt knows to arrive at 8:30  . Savory dilation N/A 06/10/2015    Procedure:  SAVORY DILATION;  Surgeon: Danie Binder, MD;  [facility];  Service: Endoscopy;  Laterality: N/A;  . Biopsy  06/10/2015    Procedure: BIOPSY;  Surgeon: Danie Binder, MD;  [facility];  Service: Endoscopy;;  gastric biopsy  . Cardiac catheterization  05-11-2003  dr Shelva Majestic ([facility])    Abnormal cardiolite/   Normal coronary arteries and normal LVF,  ef  63%  . Cystoscopy/retrograde/ureteroscopy/stone extraction with basket Right 06/28/2015    Procedure: CYSTOSCOPY/RETROGRADE/URETEROSCOPY/STONE EXTRACTION WITH BASKET, RIGHT URETERAL DOUBLE J STENT PLACEMENT;  Surgeon: Franchot Gallo, MD;  Location[facility];  Service: Urology;  Laterality: Right;  . Holmium laser application Right 09/17/1446    Procedure: HOLMIUM LASER APPLICATION;  Surgeon: Franchot Gallo, MD;  Location: [facility];  Service: Urology;  Laterality: Right;   Family History  Problem Relation Age of Onset  . Hypertension Mother   . Diabetes Mother   . Heart failure Mother   . Dementia Mother   . Emphysema Father   . Hypertension Father   . Diabetes Brother   . GER disease Brother   . Cancer      family history   . Diabetes      family history   . Heart defect      famiily history   . Arthritis      family history   . Anesthesia problems Neg Hx   . Hypotension Neg Hx   . Malignant hyperthermia Neg Hx   . Pseudochol deficiency Neg Hx   . Colon cancer Neg Hx   . Hypertension Sister   . Hypertension Sister    Social History  Substance Use Topics  . Smoking status: Former Smoker -- 1.00 packs/day for 20 years    Types: Cigarettes    Quit date: 09/21/1994  . Smokeless tobacco: Never Used  . Alcohol Use: No   OB History    Gravida Para Term Preterm AB TAB SAB Ectopic Multiple Living   2 2 2       2      Review of Systems  Constitutional: Negative for appetite change and fatigue.  HENT: Negative for congestion, ear discharge and sinus pressure.   Eyes: Negative for  discharge.  Respiratory: Negative for cough.   Cardiovascular: Negative for chest pain.  Gastrointestinal: Positive for abdominal pain. Negative for diarrhea.  Genitourinary: Negative for frequency and hematuria.  Musculoskeletal: Negative for back pain.  Skin: Negative for rash.  Neurological: Negative for seizures and headaches.  Psychiatric/Behavioral: Negative for hallucinations.      Allergies  Ace inhibitors; Keflex; Nitrofurantoin; and Penicillins  Home Medications   Prior to Admission medications   Medication Sig Start Date End Date Taking? Authorizing Provider  ALPRAZolam Duanne Moron) 0.5 MG tablet TAKE 1 TABLET BY MOUTH TWICE A DAY 05/27/15  Yes [name], MD  amLODipine (NORVASC) 10 MG tablet  Take 1 tablet (10 mg total) by mouth daily. Patient taking differently: Take 10 mg by mouth every morning.  09/09/14  Yes [name], MD  aspirin EC 81 MG tablet Take 81 mg by mouth daily.   Yes Historical Provider, MD  diphenhydramine-acetaminophen (TYLENOL PM) 25-500 MG TABS Take 1-2 tablets by mouth at bedtime as needed (for sleep/pain).    Yes Historical Provider, MD  docusate sodium (COLACE) 100 MG capsule Take 200 mg by mouth at bedtime.    Yes Historical Provider, MD  gabapentin (NEURONTIN) 100 MG capsule TAKE 1 CAPSULE (100 MG TOTAL) BY MOUTH AT BEDTIME. 02/15/15  Yes [name], MD  lovastatin (MEVACOR) 40 MG tablet TAKE 2 TABLETS (80 MG TOTAL) BY MOUTH AT BEDTIME. 06/01/15  Yes [name], MD  metFORMIN (GLUCOPHAGE) 500 MG tablet TAKE 2 TABLETS (1,000 MG TOTAL) BY MOUTH 2 (TWO) TIMES DAILY WITH A MEAL. 09/13/14  Yes [name], MD  oxybutynin (DITROPAN) 5 MG tablet Take 1 tablet (5 mg total) by mouth every 8 (eight) hours as needed for bladder spasms. 06/28/15  Yes Franchot Gallo, MD  pantoprazole (PROTONIX) 40 MG tablet TAKE 1 TABLET BY MOUTH TWICE A DAY 03/16/15  Yes Carlis Stable, NP  PARoxetine (PAXIL) 40 MG tablet TAKE 1 TABLET (40 MG TOTAL)  BY MOUTH EVERY MORNING. 07/05/15  Yes [name], MD  potassium citrate (UROCIT-K) 10 MEQ (1080 MG) SR tablet TAKE 1 TABLET 3 TIMES DAILY WITH MEALS. Patient taking differently: Take 10 mEq by mouth 2 (two) times daily.  07/27/14  Yes [name], MD  sulfamethoxazole-trimethoprim (BACTRIM DS,SEPTRA DS) 800-160 MG tablet Take 1 tablet by mouth 2 (two) times daily. 06/28/15  Yes Franchot Gallo, MD  traMADol (ULTRAM) 50 MG tablet One at bedtime Patient taking differently: Take 50 mg by mouth at bedtime as needed for moderate pain. One at bedtime 03/04/15  Yes Aviva Signs, MD  ciprofloxacin (CIPRO) 500 MG tablet Take 1 tablet (500 mg total) by mouth 2 (two) times daily. One po bid x 7 days 07/10/15   Milton Ferguson, MD  diphenoxylate-atropine (LOMOTIL) 2.5-0.025 MG per tablet Take 1 tablet by mouth 4 (four) times daily as needed for diarrhea or loose stools. 03/01/15   Tanna Furry, MD  HYDROcodone-acetaminophen (NORCO/VICODIN) 5-325 MG tablet Take 1 tablet by mouth every 6 (six) hours as needed. 07/10/15   Milton Ferguson, MD  Na Sulfate-K Sulfate-Mg Sulf SOLN Take 1 kit by mouth once. 06/22/15 07/22/15  Danie Binder, MD  Vitamin D, Ergocalciferol, (DRISDOL) 50000 UNITS CAPS capsule TAKE 1 CAPSULE (50,000 UNITS TOTAL) BY MOUTH EVERY 7 (SEVEN) DAYS. Patient taking differently: Take 50,000 Units by mouth every Sunday. TAKE 1 CAPSULE (50,000 UNITS TOTAL) BY MOUTH EVERY 7 (SEVEN) DAYS. 08/30/14   [name], MD  Vitamin D, Ergocalciferol, (DRISDOL) 50000 units CAPS capsule TAKE 1 CAPSULE (50,000 UNITS TOTAL) BY MOUTH EVERY 7 (SEVEN) DAYS. 06/29/15   [name], MD   BP 131/76 mmHg  Pulse 111  Temp(Src) 98.6 F (37 C) (Temporal)  Resp 16  Ht 5' 6"  (1.676 m)  Wt 191 lb (86.637 kg)  BMI 30.84 kg/m2  SpO2 96% Physical Exam  Constitutional: She is oriented to person, place, and time. She appears well-developed.  HENT:  Head: Normocephalic.  Eyes: Conjunctivae and EOM are normal.  No scleral icterus.  Neck: Neck supple. No thyromegaly present.  Cardiovascular: Normal rate and regular rhythm.  Exam reveals no gallop and  no friction rub.   No murmur heard. Pulmonary/Chest: No stridor. She has no wheezes. She has no rales. She exhibits no tenderness.  Abdominal: She exhibits no distension. There is tenderness. There is no rebound.  Mild tender suprapubic  Musculoskeletal: Normal range of motion. She exhibits no edema.  Lymphadenopathy:    She has no cervical adenopathy.  Neurological: She is oriented to person, place, and time. She exhibits normal muscle tone. Coordination normal.  Skin: No rash noted. No erythema.  Psychiatric: She has a normal mood and affect. Her behavior is normal.    ED Course  Procedures (including critical care time) Labs Review Labs Reviewed  CBC WITH DIFFERENTIAL/PLATELET - Abnormal; Notable for the following:    Hemoglobin 9.6 (*)    HCT 31.9 (*)    MCV 77.6 (*)    MCH 23.4 (*)    RDW 17.9 (*)    Platelets 514 (*)    All other components within normal limits  COMPREHENSIVE METABOLIC PANEL - Abnormal; Notable for the following:    Glucose, Bld 147 (*)    BUN 21 (*)    Creatinine, Ser 1.62 (*)    Total Protein 8.5 (*)    GFR calc non Af Amer 31 (*)    GFR calc Af Amer 36 (*)    All other components within normal limits  URINALYSIS, ROUTINE W REFLEX MICROSCOPIC (NOT AT [facility]) - Abnormal; Notable for the following:    Color, Urine AMBER (*)    APPearance TURBID (*)    Specific Gravity, Urine >1.030 (*)    Glucose, UA 100 (*)    Hgb urine dipstick LARGE (*)    Bilirubin Urine MODERATE (*)    Ketones, ur TRACE (*)    Protein, ur >300 (*)    Nitrite POSITIVE (*)    Leukocytes, UA MODERATE (*)    All other components within normal limits  URINE MICROSCOPIC-ADD ON - Abnormal; Notable for the following:    Squamous Epithelial / LPF 6-30 (*)    Bacteria, UA MANY (*)    All other components within normal limits  URINE CULTURE     Imaging Review Ct Renal Stone Study  07/10/2015  CLINICAL DATA:  Flank pain. Bilateral lower abdominal pain for 1 week. History of cystoscopy with stone extraction 06/28/2015, 12 days prior EXAM: CT ABDOMEN AND PELVIS WITHOUT CONTRAST TECHNIQUE: Multidetector CT imaging of the abdomen and pelvis was performed following the standard protocol without IV contrast. COMPARISON:  Abdominal radiograph 06/07/2015, CT 01/15/2015 FINDINGS: Lower chest: Minimal linear scarring in the right middle and lower lobes. Lung bases otherwise clear. Stable elevation of right hemidiaphragm. Liver: No focal lesion allowing for lack contrast. Hepatobiliary: Gallbladder physiologically distended, no calcified stone. No biliary dilatation. Pancreas: No ductal dilatation or inflammation. Spleen: Normal. Adrenal glands: No nodule. Kidneys: Right ureteral stent, proximal portion in the renal pelvis, distal portion in the bladder. No stone fragments about the stent or within the ureter. Mild prominence of the right renal pelvis and proximal ureter with adjacent fat stranding. Small stones or stone fragments in the right lower, interpolar, and upper kidney measuring from 4-7 mm. No stones in the urinary bladder. Cortical scarring and multiple nonobstructing left renal calculi are unchanged. There is a 4 mm stone in the left pelvis, localizing to the distal left ureter, however no proximal hydroureteronephrosis. There is no left perinephric stranding. Stomach/Bowel: Stomach physiologically distended with ingested contents. There are no dilated or thickened small bowel loops. High-density material in  the cecum is likely related to ingested contents. Small volume of stool throughout the colon without colonic wall thickening. Scattered diverticulosis of the distal colon from the splenic flexure through the sigmoid, no diverticulitis. The appendix is normal. Vascular/Lymphatic: No retroperitoneal adenopathy. Abdominal aorta is normal in  caliber. Mild atherosclerosis of the aorta without aneurysm. Reproductive: Uterus surgically absent.  No adnexal mass. Bladder: Physiologically distended. No bladder stone. No stone fragments. No wall thickening. Other: No free air, free fluid, or intra-abdominal fluid collection. Small fat containing umbilical hernia. Musculoskeletal: There are no acute or suspicious osseous abnormalities. IMPRESSION: 1. Right ureteral stent in place. Mild prominence of the right renal pelvis and peripelvic soft tissue stranding, likely secondary to recent procedure. No stone fragments along the course of the ureteral stent. Nonobstructing stones/stone fragments remain in the right kidney, largest measuring 7 mm. 2. Apparent 4 mm stone in the distal left ureter, however no proximal hydroureteronephrosis or inflammatory changes about the left kidney. This is likely nonobstructing. Additional nonobstructing left renal calculi are seen. 3. Colonic diverticulosis without diverticulitis. Electronically Signed   By: Jeb Levering M.D.   On: 07/10/2015 20:29   I have personally reviewed and evaluated these images and lab results as part of my medical decision-making.   EKG Interpretation None     I spoke to urology and they agree with putting the patient on Cipro and have her follow-up with urology this week  MDM   Final diagnoses:  Flank pain  UTI (lower urinary tract infection)    Diagnosis UTI with stent patient will be prescribed Cipro will follow-up in urology this week    Milton Ferguson, MD 07/10/15 2149

## 2015-07-10 NOTE — ED Notes (Signed)
Patient attempted to obtain ua sample- but unsuccessful.

## 2015-07-10 NOTE — Discharge Instructions (Signed)
Call urology tomorrow at [phone] to be seen this week.  Continue taking the bladder spasm medicne

## 2015-07-10 NOTE — ED Notes (Signed)
Pt reports her lower abdomen has been hurting since approx last Wednesday. Reports that had one day sx on 06/28/15 to remove a kidney stone. Conts to have lower abdominal pain worsening with ambulation

## 2015-07-12 ENCOUNTER — Other Ambulatory Visit: Payer: Self-pay

## 2015-07-12 ENCOUNTER — Inpatient Hospital Stay (HOSPITAL_COMMUNITY)
Admission: AD | Admit: 2015-07-12 | Discharge: 2015-07-14 | DRG: 690 | Disposition: A | Discharge status: Home / Self Care | Payer: Medicare Other | Source: Ambulatory Visit | Attending: Internal Medicine | Admitting: Internal Medicine

## 2015-07-12 ENCOUNTER — Encounter (HOSPITAL_COMMUNITY): Payer: Self-pay

## 2015-07-12 ENCOUNTER — Encounter (INDEPENDENT_AMBULATORY_CARE_PROVIDER_SITE_OTHER): Payer: Medicare Other | Admitting: Urology

## 2015-07-12 DIAGNOSIS — K219 Gastro-esophageal reflux disease without esophagitis: Secondary | ICD-10-CM | POA: Diagnosis present

## 2015-07-12 DIAGNOSIS — E785 Hyperlipidemia, unspecified: Secondary | ICD-10-CM | POA: Diagnosis present

## 2015-07-12 DIAGNOSIS — Z87891 Personal history of nicotine dependence: Secondary | ICD-10-CM

## 2015-07-12 DIAGNOSIS — I129 Hypertensive chronic kidney disease with stage 1 through stage 4 chronic kidney disease, or unspecified chronic kidney disease: Secondary | ICD-10-CM | POA: Diagnosis present

## 2015-07-12 DIAGNOSIS — N390 Urinary tract infection, site not specified: Secondary | ICD-10-CM | POA: Diagnosis present

## 2015-07-12 DIAGNOSIS — Z880 Allergy status to penicillin: Secondary | ICD-10-CM

## 2015-07-12 DIAGNOSIS — Z881 Allergy status to other antibiotic agents status: Secondary | ICD-10-CM | POA: Diagnosis not present

## 2015-07-12 DIAGNOSIS — Z936 Other artificial openings of urinary tract status: Secondary | ICD-10-CM

## 2015-07-12 DIAGNOSIS — Z7984 Long term (current) use of oral hypoglycemic drugs: Secondary | ICD-10-CM

## 2015-07-12 DIAGNOSIS — N183 Chronic kidney disease, stage 3 unspecified: Secondary | ICD-10-CM | POA: Diagnosis present

## 2015-07-12 DIAGNOSIS — E1122 Type 2 diabetes mellitus with diabetic chronic kidney disease: Secondary | ICD-10-CM | POA: Diagnosis present

## 2015-07-12 DIAGNOSIS — Z794 Long term (current) use of insulin: Secondary | ICD-10-CM | POA: Diagnosis not present

## 2015-07-12 DIAGNOSIS — Z888 Allergy status to other drugs, medicaments and biological substances status: Secondary | ICD-10-CM

## 2015-07-12 DIAGNOSIS — N202 Calculus of kidney with calculus of ureter: Secondary | ICD-10-CM | POA: Diagnosis present

## 2015-07-12 DIAGNOSIS — E1121 Type 2 diabetes mellitus with diabetic nephropathy: Secondary | ICD-10-CM | POA: Diagnosis present

## 2015-07-12 DIAGNOSIS — R3982 Chronic bladder pain: Secondary | ICD-10-CM

## 2015-07-12 DIAGNOSIS — R110 Nausea: Secondary | ICD-10-CM

## 2015-07-12 DIAGNOSIS — N200 Calculus of kidney: Secondary | ICD-10-CM | POA: Diagnosis not present

## 2015-07-12 DIAGNOSIS — N1330 Unspecified hydronephrosis: Secondary | ICD-10-CM | POA: Diagnosis present

## 2015-07-12 DIAGNOSIS — N201 Calculus of ureter: Secondary | ICD-10-CM

## 2015-07-12 DIAGNOSIS — G4733 Obstructive sleep apnea (adult) (pediatric): Secondary | ICD-10-CM | POA: Diagnosis present

## 2015-07-12 DIAGNOSIS — I10 Essential (primary) hypertension: Secondary | ICD-10-CM

## 2015-07-12 DIAGNOSIS — R1031 Right lower quadrant pain: Secondary | ICD-10-CM

## 2015-07-12 LAB — BASIC METABOLIC PANEL
Anion gap: 7 (ref 5–15)
BUN: 16 mg/dL (ref 6–20)
CO2: 24 mmol/L (ref 22–32)
Calcium: 9.8 mg/dL (ref 8.9–10.3)
Chloride: 106 mmol/L (ref 101–111)
Creatinine, Ser: 1.25 mg/dL — ABNORMAL HIGH (ref 0.44–1.00)
GFR calc Af Amer: 49 mL/min — ABNORMAL LOW (ref >60)
GFR calc non Af Amer: 43 mL/min — ABNORMAL LOW (ref >60)
Glucose, Bld: 72 mg/dL (ref 65–99)
Potassium: 4.8 mmol/L (ref 3.5–5.1)
Sodium: 137 mmol/L (ref 135–145)

## 2015-07-12 LAB — GLUCOSE, CAPILLARY: Glucose-Capillary: 122 mg/dL — ABNORMAL HIGH (ref 65–99)

## 2015-07-12 LAB — CBC WITH DIFFERENTIAL/PLATELET
Basophils Absolute: 0 10*3/uL (ref 0.0–0.1)
Basophils Relative: 1 %
Eosinophils Absolute: 0.3 10*3/uL (ref 0.0–0.7)
Eosinophils Relative: 3 %
HCT: 29.9 % — ABNORMAL LOW (ref 36.0–46.0)
Hemoglobin: 9 g/dL — ABNORMAL LOW (ref 12.0–15.0)
Lymphocytes Relative: 23 %
Lymphs Abs: 1.9 10*3/uL (ref 0.7–4.0)
MCH: 23.4 pg — ABNORMAL LOW (ref 26.0–34.0)
MCHC: 30.1 g/dL (ref 30.0–36.0)
MCV: 77.9 fL — ABNORMAL LOW (ref 78.0–100.0)
Monocytes Absolute: 1 10*3/uL (ref 0.1–1.0)
Monocytes Relative: 12 %
Neutro Abs: 5 10*3/uL (ref 1.7–7.7)
Neutrophils Relative %: 61 %
Platelets: 469 10*3/uL — ABNORMAL HIGH (ref 150–400)
RBC: 3.84 MIL/uL — ABNORMAL LOW (ref 3.87–5.11)
RDW: 18.2 % — ABNORMAL HIGH (ref 11.5–15.5)
WBC: 8.1 10*3/uL (ref 4.0–10.5)

## 2015-07-12 LAB — URINE CULTURE: Culture: NO GROWTH

## 2015-07-12 MED ORDER — PANTOPRAZOLE SODIUM 40 MG PO TBEC
40.0000 mg | DELAYED_RELEASE_TABLET | Freq: Two times a day (BID) | ORAL | Status: DC
  Administered 2015-07-12 – 2015-07-14 (×4): 40 mg via ORAL
  Filled 2015-07-12 (×4): qty 1

## 2015-07-12 MED ORDER — INSULIN ASPART 100 UNIT/ML ~~LOC~~ SOLN: 0.0000 [IU] | Freq: Every day | SUBCUTANEOUS | Status: DC

## 2015-07-12 MED ORDER — CIPROFLOXACIN IN D5W 400 MG/200ML IV SOLN
400.0000 mg | Freq: Two times a day (BID) | INTRAVENOUS | Status: DC
  Administered 2015-07-12 – 2015-07-14 (×5): 400 mg via INTRAVENOUS
  Filled 2015-07-12 (×5): qty 200

## 2015-07-12 MED ORDER — OXYCODONE HCL 5 MG PO TABS
5.0000 mg | ORAL_TABLET | ORAL | Status: DC | PRN
  Administered 2015-07-12 – 2015-07-13 (×4): 5 mg via ORAL
  Filled 2015-07-12 (×4): qty 1

## 2015-07-12 MED ORDER — OXYBUTYNIN CHLORIDE 5 MG PO TABS
5.0000 mg | ORAL_TABLET | Freq: Three times a day (TID) | ORAL | Status: DC | PRN
  Administered 2015-07-12 – 2015-07-13 (×2): 5 mg via ORAL
  Filled 2015-07-12 (×2): qty 1

## 2015-07-12 MED ORDER — ZOLPIDEM TARTRATE 5 MG PO TABS
5.0000 mg | ORAL_TABLET | Freq: Every evening | ORAL | Status: DC | PRN
  Administered 2015-07-12 – 2015-07-13 (×2): 5 mg via ORAL
  Filled 2015-07-12 (×2): qty 1

## 2015-07-12 MED ORDER — OXYBUTYNIN CHLORIDE 5 MG PO TABS: 5.0000 mg | ORAL_TABLET | Freq: Three times a day (TID) | ORAL | Status: DC | PRN

## 2015-07-12 MED ORDER — DOCUSATE SODIUM 100 MG PO CAPS
100.0000 mg | ORAL_CAPSULE | Freq: Two times a day (BID) | ORAL | Status: DC
  Administered 2015-07-12 – 2015-07-14 (×5): 100 mg via ORAL
  Filled 2015-07-12 (×5): qty 1

## 2015-07-12 MED ORDER — METFORMIN HCL 500 MG PO TABS
1000.0000 mg | ORAL_TABLET | Freq: Two times a day (BID) | ORAL | Status: DC
  Administered 2015-07-13 – 2015-07-14 (×3): 1000 mg via ORAL
  Filled 2015-07-12 (×3): qty 2

## 2015-07-12 MED ORDER — DOCUSATE SODIUM 100 MG PO CAPS: 200.0000 mg | ORAL_CAPSULE | Freq: Every day | ORAL | Status: DC

## 2015-07-12 MED ORDER — ONDANSETRON HCL 4 MG/2ML IJ SOLN: 4.0000 mg | INTRAMUSCULAR | Status: DC | PRN

## 2015-07-12 MED ORDER — ASPIRIN EC 81 MG PO TBEC
81.0000 mg | DELAYED_RELEASE_TABLET | Freq: Every day | ORAL | Status: DC
Start: 2015-07-13 — End: 2015-07-14
  Administered 2015-07-13 – 2015-07-14 (×2): 81 mg via ORAL
  Filled 2015-07-12 (×2): qty 1

## 2015-07-12 MED ORDER — ENOXAPARIN SODIUM 30 MG/0.3ML ~~LOC~~ SOLN
30.0000 mg | Freq: Two times a day (BID) | SUBCUTANEOUS | Status: DC
  Administered 2015-07-12 – 2015-07-14 (×5): 30 mg via SUBCUTANEOUS
  Filled 2015-07-12 (×5): qty 0.3

## 2015-07-12 MED ORDER — ACETAMINOPHEN 325 MG PO TABS
650.0000 mg | ORAL_TABLET | ORAL | Status: DC | PRN
  Administered 2015-07-12: 650 mg via ORAL
  Filled 2015-07-12: qty 2

## 2015-07-12 MED ORDER — DIPHENHYDRAMINE HCL 25 MG PO CAPS: 25.0000 mg | ORAL_CAPSULE | Freq: Every evening | ORAL | Status: DC | PRN

## 2015-07-12 MED ORDER — INSULIN ASPART 100 UNIT/ML ~~LOC~~ SOLN
0.0000 [IU] | Freq: Three times a day (TID) | SUBCUTANEOUS | Status: DC
  Administered 2015-07-13: 2 [IU] via SUBCUTANEOUS

## 2015-07-12 MED ORDER — SODIUM CHLORIDE 0.45 % IV SOLN
INTRAVENOUS | Status: DC
  Administered 2015-07-12 – 2015-07-14 (×3): via INTRAVENOUS

## 2015-07-12 MED ORDER — PAROXETINE HCL 20 MG PO TABS
40.0000 mg | ORAL_TABLET | Freq: Every day | ORAL | Status: DC
  Administered 2015-07-13 – 2015-07-14 (×2): 40 mg via ORAL
  Filled 2015-07-12 (×2): qty 2

## 2015-07-12 MED ORDER — AMLODIPINE BESYLATE 5 MG PO TABS
10.0000 mg | ORAL_TABLET | Freq: Every morning | ORAL | Status: DC
  Administered 2015-07-13 – 2015-07-14 (×2): 10 mg via ORAL
  Filled 2015-07-12 (×2): qty 2

## 2015-07-12 MED ORDER — ALUM & MAG HYDROXIDE-SIMETH 200-200-20 MG/5ML PO SUSP
30.0000 mL | ORAL | Status: DC | PRN
  Administered 2015-07-12: 30 mL via ORAL
  Filled 2015-07-12 (×2): qty 30

## 2015-07-12 MED ORDER — GABAPENTIN 100 MG PO CAPS
100.0000 mg | ORAL_CAPSULE | Freq: Every day | ORAL | Status: DC
  Administered 2015-07-12 – 2015-07-13 (×2): 100 mg via ORAL
  Filled 2015-07-12 (×2): qty 1

## 2015-07-12 MED ORDER — ALPRAZOLAM 0.5 MG PO TABS
0.5000 mg | ORAL_TABLET | Freq: Two times a day (BID) | ORAL | Status: DC
  Administered 2015-07-12 – 2015-07-14 (×4): 0.5 mg via ORAL
  Filled 2015-07-12 (×4): qty 1

## 2015-07-12 MED ORDER — PRAVASTATIN SODIUM 40 MG PO TABS
80.0000 mg | ORAL_TABLET | Freq: Every day | ORAL | Status: DC
  Administered 2015-07-13: 80 mg via ORAL
  Filled 2015-07-12: qty 2

## 2015-07-12 NOTE — H&P (Signed)
H&P  Chief Complaint: Bladder pain  History of Present Illness: Cheryl Abbott is a 70 y.o. year old female who is admitted through my office for management of a urinary tract infection. She underwent ureteroscopic management of right renal calculi on 06/28/2015. She was sent home on 5 days of Bactrim. 3-4 days ago, she began having lower abdominal discomfort, frequency, urgency and increasing gross hematuria. She denies lateralizing flank pain, but has had some nausea and one episode of emesis over the past day or 2. She denies fever, shakes, chills. She went to the emergency room here in [place] 2 days ago where she was found to have a UTI. There was no leukocytosis on her blood count. She did have a CT revealing a decompressed right kidney with a few residual fragments from stones, as well as a 4 mm left distal ureteral stone with no hydronephrosis. She was seen in follow-up in the office today. She had mild suprapubic tenderness but no lateralizing flank tenderness. There was no recent fever or chills. Because she looked uncomfortable, but not septic, I have admitted her for further management.  Past Medical History  Diagnosis Date  . Anxiety disorder   . Hyperlipidemia   . Hypertension   . Depression   . Chronic back pain   . Headache   . Type 2 diabetes mellitus ([place])   . GERD (gastroesophageal reflux disease)   . Anemia   . History of adenomatous polyp of colon     2008  . Fatty liver   . Sigmoid diverticulosis   . Left ureteral stone   . History of kidney stones   . Arthritis   . Hypercalcemia   . OSA treated with BiPAP     per study 2007  . Nephrolithiasis   . Sciatica   . Lumbar disc disease with radiculopathy   . Neuropathy, peripheral [facility])     Past Surgical History  Procedure Laterality Date  . Flexible sigmoidoscopy  09/11/2011    [id number] Internal hemorrhoids  . Esophagogastroduodenoscopy (egd) with propofol N/A 03/17/2013    Procedure:  ESOPHAGOGASTRODUODENOSCOPY (EGD) WITH PROPOFOL;  Surgeon: Danie Binder, MD;  Location: [facility];  Service: Endoscopy;  Laterality: N/A;  . Savory dilation N/A 03/17/2013    Procedure: SAVORY DILATION;  Surgeon: Danie Binder, MD;  Location: [facility];  Service: Endoscopy;  Laterality: N/A;  #12.8, 14, 15, 16 dilators used  . Biopsy N/A 03/17/2013    Procedure: GASTRIC BIOPSIES;  Surgeon: Danie Binder, MD;  Location: [facility];  Service: Endoscopy;  Laterality: N/A;  . Polypectomy N/A 03/17/2013    Procedure: GASTRIC POLYPECTOMY;  Surgeon: Danie Binder, MD;  Location: [facility];  Service: Endoscopy;  Laterality: N/A;  . Colonoscopy  last one 10/02/2011    [facility] [place] [facility], IH, NEXT TCS APR 2018  . Cysto/   right ureterosocopy laser lithotripsy stone extraction  10-13-2004  . Cysto/  right retrograde pyelogram/  placment right ureteral stent  01-10-2010  . Percutaneous nephrostolithotomy Bilateral 12/  2011     [facility]  . Cardiovascular stress test  01-01-2014    normal lexiscan cardiolite/  no ischemia/ infarct/  normal LV function and wall motion ,  ef 81%  . Transthoracic echocardiogram  01-01-2014    mild LVH/  ef [id number]  grade I diastolic dysfunction/  trivial MR, TR, and PR  . Removal right thigh cyst  2006  . Vaginal hysterectomy  1970's  . Cystoscopy with retrograde pyelogram, ureteroscopy  and stent placement Left 10/21/2014    Procedure: CYSTOSCOPY WITH RETROGRADE PYELOGRAM, URETEROSCOPY AND STENT PLACEMENT;  Surgeon: Franchot Gallo, MD;  Location: [facility];  Service: Urology;  Laterality: Left;  . Holmium laser application Left [id number]    Procedure: HOLMIUM LASER APPLICATION;  Surgeon: Franchot Gallo, MD;  Location: [facility];  Service: Urology;  Laterality: Left;  Marland Kitchen Mass excision Left 03/04/2015    Procedure: EXCISION OF SOFT TISSUE NEOPLASM LEFT ARM;  Surgeon: Aviva Signs, MD;  Location: [facility];  Service: General;  Laterality: Left;  .  Esophagogastroduodenoscopy (egd) with propofol N/A 06/10/2015    Procedure: ESOPHAGOGASTRODUODENOSCOPY (EGD) WITH PROPOFOL;  Surgeon: Danie Binder, MD;  [facility];  Service: Endoscopy;  Laterality: N/A;  1245 - pt knows to arrive at 8:30  . Savory dilation N/A 06/10/2015    Procedure: SAVORY DILATION;  Surgeon: Danie Binder, MD;  [facility];  Service: Endoscopy;  Laterality: N/A;  . Biopsy  06/10/2015    Procedure: BIOPSY;  Surgeon: Danie Binder, MD;  [facility];  Service: Endoscopy;;  gastric biopsy  . Cardiac catheterization  05-11-2003  dr Shelva Majestic ([facility])    Abnormal cardiolite/   Normal coronary arteries and normal LVF,  ef  63%  . Cystoscopy/retrograde/ureteroscopy/stone extraction with basket Right 06/28/2015    Procedure: CYSTOSCOPY/RETROGRADE/URETEROSCOPY/STONE EXTRACTION WITH BASKET, RIGHT URETERAL DOUBLE J STENT PLACEMENT;  Surgeon: Franchot Gallo, MD;  Location: [facility];  Service: Urology;  Laterality: Right;  . Holmium laser application Right [id number]    Procedure: HOLMIUM LASER APPLICATION;  Surgeon: Franchot Gallo, MD;  Location: [facility];  Service: Urology;  Laterality: Right;    Home Medications:   (Not in a hospital admission)  Allergies:  Allergies  Allergen Reactions  . Ace Inhibitors Cough  . Keflex [Cephalexin] Diarrhea and Nausea And Vomiting  . Nitrofurantoin Diarrhea and Nausea And Vomiting  . Penicillins Hives, Rash and Other (See Comments)    Blisters on hands and feet    Family History  Problem Relation Age of Onset  . Hypertension Mother   . Diabetes Mother   . Heart failure Mother   . Dementia Mother   . Emphysema Father   . Hypertension Father   . Diabetes Brother   . GER disease Brother   . Cancer      family history   . Diabetes      family history   . Heart defect      famiily history   . Arthritis      family history   . Anesthesia problems Neg Hx   . Hypotension  Neg Hx   . Malignant hyperthermia Neg Hx   . Pseudochol deficiency Neg Hx   . Colon cancer Neg Hx   . Hypertension Sister   . Hypertension Sister     Social History:  reports that she quit smoking about 20 years ago. Her smoking use included Cigarettes. She has a 20 pack-year smoking history. She has never used smokeless tobacco. She reports that she does not drink alcohol or use illicit drugs.  ROS: Positive-frequency, urgency, dysuria, bladder pain, intermittent hematuria, nausea, one episode of emesis Negative-no fever, chills, no flank or abdominal pain  Physical Exam:  Vital signs in last 24 hours: @VSRANGES @ General:  Alert and oriented, mild distress HEENT: Normocephalic, atraumatic Neck: No JVD or lymphadenopathy Cardiovascular: Regular rate and rhythm Lungs: Clear bilaterally Abdomen: Soft, nontender, nondistended, no abdominal  masses Back: No CVA tenderness Extremities: No edema Neurologic: Grossly intact  Laboratory Data:  No results found for this or any previous visit (from the past 24 hour(s)). Recent Results (from the past 240 hour(s))  Urine culture     Status: None (Preliminary result)   Collection Time: 07/10/15  8:40 PM  Result Value Ref Range Status   Specimen Description URINE, CATHETERIZED  Final   Special Requests NONE  Final   Culture   Final    NO GROWTH < 24 HOURS Performed at [facility]    Report Status PENDING  Incomplete   Creatinine:  Recent Labs  07/10/15 1937  CREATININE 1.62*    Radiologic Imaging: Ct Renal Stone Study  07/10/2015  CLINICAL DATA:  Flank pain. Bilateral lower abdominal pain for 1 week. History of cystoscopy with stone extraction 06/28/2015, 12 days prior EXAM: CT ABDOMEN AND PELVIS WITHOUT CONTRAST TECHNIQUE: Multidetector CT imaging of the abdomen and pelvis was performed following the standard protocol without IV contrast. COMPARISON:  Abdominal radiograph 06/07/2015, CT 01/15/2015 FINDINGS: Lower chest:  Minimal linear scarring in the right middle and lower lobes. Lung bases otherwise clear. Stable elevation of right hemidiaphragm. Liver: No focal lesion allowing for lack contrast. Hepatobiliary: Gallbladder physiologically distended, no calcified stone. No biliary dilatation. Pancreas: No ductal dilatation or inflammation. Spleen: Normal. Adrenal glands: No nodule. Kidneys: Right ureteral stent, proximal portion in the renal pelvis, distal portion in the bladder. No stone fragments about the stent or within the ureter. Mild prominence of the right renal pelvis and proximal ureter with adjacent fat stranding. Small stones or stone fragments in the right lower, interpolar, and upper kidney measuring from 4-7 mm. No stones in the urinary bladder. Cortical scarring and multiple nonobstructing left renal calculi are unchanged. There is a 4 mm stone in the left pelvis, localizing to the distal left ureter, however no proximal hydroureteronephrosis. There is no left perinephric stranding. Stomach/Bowel: Stomach physiologically distended with ingested contents. There are no dilated or thickened small bowel loops. High-density material in the cecum is likely related to ingested contents. Small volume of stool throughout the colon without colonic wall thickening. Scattered diverticulosis of the distal colon from the splenic flexure through the sigmoid, no diverticulitis. The appendix is normal. Vascular/Lymphatic: No retroperitoneal adenopathy. Abdominal aorta is normal in caliber. Mild atherosclerosis of the aorta without aneurysm. Reproductive: Uterus surgically absent.  No adnexal mass. Bladder: Physiologically distended. No bladder stone. No stone fragments. No wall thickening. Other: No free air, free fluid, or intra-abdominal fluid collection. Small fat containing umbilical hernia. Musculoskeletal: There are no acute or suspicious osseous abnormalities. IMPRESSION: 1. Right ureteral stent in place. Mild prominence of  the right renal pelvis and peripelvic soft tissue stranding, likely secondary to recent procedure. No stone fragments along the course of the ureteral stent. Nonobstructing stones/stone fragments remain in the right kidney, largest measuring 7 mm. 2. Apparent 4 mm stone in the distal left ureter, however no proximal hydroureteronephrosis or inflammatory changes about the left kidney. This is likely nonobstructing. Additional nonobstructing left renal calculi are seen. 3. Colonic diverticulosis without diverticulitis. Electronically Signed   By: Jeb Levering M.D.   On: 07/10/2015 20:29   Cath urine was obtained. This still looks mildly infected, but much better than the urinalysis from 2 days ago in the hospital. Urine culture was pending. Impression/Assessment:  1. Right renal calculi, status post ureteroscopy, holmium laser lithotripsy and stone extraction on 06/28/2015. She has a few residual fragments left  in the right renal unit, but stent is adequately decompressing that side  2. Small left distal ureteral stone, nonobstructing, patient does not seemingly have symptoms from this  3. UTI, currently on Cipro, although she has only taken 2 doses over the past 3 days  Plan:  1. She will be admitted for pain management as well as IV antibiotics  2. Hydration  3. I will contact the hospital service about hopefully taking over her care while she is here. I think a couple of days of antibiotic management, especially tailoring this to her culture, which will hopefully be back tomorrow, would be appropriate. I do not think at this point she has significant symptoms from her left distal ureteral stone and we can watch that area she might need stenting if her condition changes significantly  Jorja Loa 07/12/2015, 11:46 AM  Lillette Boxer. Maclovio Henson MD

## 2015-07-12 NOTE — Progress Notes (Signed)
[place] for Renal Adjustment of ABX if needed  Allergies  Allergen Reactions  . Ace Inhibitors Cough  . Keflex [Cephalexin] Diarrhea and Nausea And Vomiting  . Nitrofurantoin Diarrhea and Nausea And Vomiting  . Penicillins Hives, Rash and Other (See Comments)    Blisters on hands and feet   Last recorded weight = 86.6Kg  Vital Signs:    Labs:  Recent Labs  07/10/15 1937  WBC 10.1  HGB 9.6*  PLT 514*  CREATININE 1.62*   No results for input(s): VANCOTROUGH, VANCOPEAK, VANCORANDOM, GENTTROUGH, GENTPEAK, GENTRANDOM, TOBRATROUGH, TOBRAPEAK, TOBRARND, AMIKACINPEAK, AMIKACINTROU, AMIKACIN in the last 72 hours.   Medical History: Past Medical History  Diagnosis Date  . Anxiety disorder   . Hyperlipidemia   . Hypertension   . Depression   . Chronic back pain   . Headache   . Type 2 diabetes mellitus ([place])   . GERD (gastroesophageal reflux disease)   . Anemia   . History of adenomatous polyp of colon     2008  . Fatty liver   . Sigmoid diverticulosis   . Left ureteral stone   . History of kidney stones   . Arthritis   . Hypercalcemia   . OSA treated with BiPAP     per study 2007  . Nephrolithiasis   . Sciatica   . Lumbar disc disease with radiculopathy   . Neuropathy, peripheral [facility])     Assessment: 70 yo female,  Estimated clcr > 30.  No adjustment needed at this time.  Estimated Creatinine Clearance: 35.8 mL/min (by C-G formula based on Cr of 1.62).  Plan: Continue Cipro 400mg  IV q12hrs Switch to PO when appropriate  Ena Dawley, RPH 07/12/2015,12:43 PM

## 2015-07-12 NOTE — Consult Note (Signed)
[facility] Medical Consultation  Cheryl Abbott [id number] DOB: 08-Aug-1945 DOA: 07/12/2015 PCP: Tula Nakayama, MD   Requesting physician: Dr. Luberta Robertson Date of consultation: 07/12/2015 Reason for consultation: Medical management  Impression/Recommendations Principal Problem:   Acute UTI Active Problems:   Type 2 diabetes with nephropathy ([place])   Hyperlipidemia LDL goal <100   Hypertension goal BP (blood pressure) < 130/80   GERD   RENAL CALCULUS   CKD (chronic kidney disease) stage 3, GFR 30-59 ml/min    1. UTI. Started on levofloxacin. Follow-up urine culture 2. CKD stage III. Creatinine appears to be improving. Approaching baseline. Continue to follow. 3. Hypertension. Stable. Continue outpatient regimen. 4. Hyperlipidemia. Continue statin. 5. GERD. Continue PPI and as needed Maalox. 6. Right renal calculus. Does not appear to be obstructing at this time. Patient is asymptomatic. She will follow up with urology for further management. 7. Diabetes. Continue metformin and start sliding scale insulin.  Dr. Luberta Robertson has requested that we assume care of this patient. Will follow up in a.m. and consider discharge if she is improved.    Chief Complaint: Abdominal pain  HPI:  This is a 70 year old female with history of renal stones who underwent ureteroscopy and stent placement for right-sided renal stone with associated hydronephrosis. She initially felt improved. She did have hematuria which was expected after this procedure. She reports that over the past week, she had worsening abdominal pain and had hematuria. She presented in the emergency room and was given a prescription for ciprofloxacin was also follow-up with her urologist. She followed up in the urology office today and continue to complain of abdominal pain, nausea and hematuria. The patient was admitted for intravenous antibiotics.  Review of Systems:  Pertinent positives as per history of present  illness, otherwise negative  Past Medical History  Diagnosis Date  . Anxiety disorder   . Hyperlipidemia   . Hypertension   . Depression   . Chronic back pain   . Headache   . Type 2 diabetes mellitus ([place])   . GERD (gastroesophageal reflux disease)   . Anemia   . History of adenomatous polyp of colon     2008  . Fatty liver   . Sigmoid diverticulosis   . Left ureteral stone   . History of kidney stones   . Arthritis   . Hypercalcemia   . OSA treated with BiPAP     per study 2007  . Nephrolithiasis   . Sciatica   . Lumbar disc disease with radiculopathy   . Neuropathy, peripheral [facility])    Past Surgical History  Procedure Laterality Date  . Flexible sigmoidoscopy  09/11/2011    [id number] Internal hemorrhoids  . Esophagogastroduodenoscopy (egd) with propofol N/A 03/17/2013    Procedure: ESOPHAGOGASTRODUODENOSCOPY (EGD) WITH PROPOFOL;  Surgeon: Danie Binder, MD;  Location: [facility];  Service: Endoscopy;  Laterality: N/A;  . Savory dilation N/A 03/17/2013    Procedure: SAVORY DILATION;  Surgeon: Danie Binder, MD;  Location: [facility];  Service: Endoscopy;  Laterality: N/A;  #12.8, 14, 15, 16 dilators used  . Biopsy N/A 03/17/2013    Procedure: GASTRIC BIOPSIES;  Surgeon: Danie Binder, MD;  Location: [facility];  Service: Endoscopy;  Laterality: N/A;  . Polypectomy N/A 03/17/2013    Procedure: GASTRIC POLYPECTOMY;  Surgeon: Danie Binder, MD;  Location: [facility];  Service: Endoscopy;  Laterality: N/A;  . Colonoscopy  last one 10/02/2011    MOD [facility] TICS, IH, NEXT TCS APR 2018  .  Cysto/   right ureterosocopy laser lithotripsy stone extraction  10-13-2004  . Cysto/  right retrograde pyelogram/  placment right ureteral stent  01-10-2010  . Percutaneous nephrostolithotomy Bilateral 12/  2011     [facility]  . Cardiovascular stress test  01-01-2014    normal lexiscan cardiolite/  no ischemia/ infarct/  normal LV function and wall motion ,  ef 81%  . Transthoracic echocardiogram  01-01-2014     mild LVH/  ef [id number]  grade I diastolic dysfunction/  trivial MR, TR, and PR  . Removal right thigh cyst  2006  . Vaginal hysterectomy  1970's  . Cystoscopy with retrograde pyelogram, ureteroscopy and stent placement Left 10/21/2014    Procedure: CYSTOSCOPY WITH RETROGRADE PYELOGRAM, URETEROSCOPY AND STENT PLACEMENT;  Surgeon: Franchot Gallo, MD;  Location[facility];  Service: Urology;  Laterality: Left;  . Holmium laser application Left [id number]    Procedure: HOLMIUM LASER APPLICATION;  Surgeon: Franchot Gallo, MD;  Location: [facility];  Service: Urology;  Laterality: Left;  Marland Kitchen Mass excision Left 03/04/2015    Procedure: EXCISION OF SOFT TISSUE NEOPLASM LEFT ARM;  Surgeon: Aviva Signs, MD;  Location: [facility];  Service: General;  Laterality: Left;  . Esophagogastroduodenoscopy (egd) with propofol N/A 06/10/2015    Procedure: ESOPHAGOGASTRODUODENOSCOPY (EGD) WITH PROPOFOL;  Surgeon: Danie Binder, MD;  [facility];  Service: Endoscopy;  Laterality: N/A;  1245 - pt knows to arrive at 8:30  . Savory dilation N/A 06/10/2015    Procedure: SAVORY DILATION;  Surgeon: Danie Binder, MD;  [facility];  Service: Endoscopy;  Laterality: N/A;  . Biopsy  06/10/2015    Procedure: BIOPSY;  Surgeon: Danie Binder, MD;  [facility];  Service: Endoscopy;;  gastric biopsy  . Cardiac catheterization  05-11-2003  dr Shelva Majestic ([facility])    Abnormal cardiolite/   Normal coronary arteries and normal LVF,  ef  63%  . Cystoscopy/retrograde/ureteroscopy/stone extraction with basket Right 06/28/2015    Procedure: CYSTOSCOPY/RETROGRADE/URETEROSCOPY/STONE EXTRACTION WITH BASKET, RIGHT URETERAL DOUBLE J STENT PLACEMENT;  Surgeon: Franchot Gallo, MD;  Location: [facility];  Service: Urology;  Laterality: Right;  . Holmium laser application Right [id number]    Procedure: HOLMIUM LASER APPLICATION;  Surgeon: Franchot Gallo, MD;   Location: [facility];  Service: Urology;  Laterality: Right;   Social History:  reports that she quit smoking about 20 years ago. Her smoking use included Cigarettes. She has a 20 pack-year smoking history. She has never used smokeless tobacco. She reports that she does not drink alcohol or use illicit drugs.  Allergies  Allergen Reactions  . Ace Inhibitors Cough  . Keflex [Cephalexin] Diarrhea and Nausea And Vomiting  . Nitrofurantoin Diarrhea and Nausea And Vomiting  . Penicillins Hives, Rash and Other (See Comments)    Blisters on hands and feet Has patient had a PCN reaction causing immediate rash, facial/tongue/throat swelling, SOB or lightheadedness with hypotension: Yes Has patient had a PCN reaction causing severe rash involving mucus membranes or skin necrosis: No Has patient had a PCN reaction that required hospitalization No Has patient had a PCN reaction occurring within the last 10 years: No If all of the above answers are "NO", then may proceed with Cephalosporin use.    Family History  Problem Relation Age of Onset  . Hypertension Mother   . Diabetes Mother   . Heart failure Mother   . Dementia Mother   . Emphysema Father   .  Hypertension Father   . Diabetes Brother   . GER disease Brother   . Cancer      family history   . Diabetes      family history   . Heart defect      famiily history   . Arthritis      family history   . Anesthesia problems Neg Hx   . Hypotension Neg Hx   . Malignant hyperthermia Neg Hx   . Pseudochol deficiency Neg Hx   . Colon cancer Neg Hx   . Hypertension Sister   . Hypertension Sister     Prior to Admission medications   Medication Sig Start Date End Date Taking? Authorizing Provider  ALPRAZolam Duanne Moron) 0.5 MG tablet TAKE 1 TABLET BY MOUTH TWICE A DAY 05/27/15  Yes [name], MD  amLODipine (NORVASC) 10 MG tablet Take 1 tablet (10 mg total) by mouth daily. Patient taking differently: Take 10 mg by mouth every morning.   09/09/14  Yes [name], MD  aspirin EC 81 MG tablet Take 81 mg by mouth daily.   Yes Historical Provider, MD  diphenhydramine-acetaminophen (TYLENOL PM) 25-500 MG TABS Take 1-2 tablets by mouth at bedtime as needed (for sleep/pain).    Yes Historical Provider, MD  diphenoxylate-atropine (LOMOTIL) 2.5-0.025 MG per tablet Take 1 tablet by mouth 4 (four) times daily as needed for diarrhea or loose stools. 03/01/15  Yes Tanna Furry, MD  docusate sodium (COLACE) 100 MG capsule Take 200 mg by mouth at bedtime.    Yes Historical Provider, MD  gabapentin (NEURONTIN) 100 MG capsule TAKE 1 CAPSULE (100 MG TOTAL) BY MOUTH AT BEDTIME. 07/11/15  Yes [name], MD  HYDROcodone-acetaminophen (NORCO/VICODIN) 5-325 MG tablet Take 1 tablet by mouth every 6 (six) hours as needed. Patient taking differently: Take 1 tablet by mouth every 6 (six) hours as needed for moderate pain.  07/10/15  Yes Milton Ferguson, MD  lovastatin (MEVACOR) 40 MG tablet TAKE 2 TABLETS (80 MG TOTAL) BY MOUTH AT BEDTIME. 06/01/15  Yes [name], MD  metFORMIN (GLUCOPHAGE) 500 MG tablet TAKE 2 TABLETS (1,000 MG TOTAL) BY MOUTH 2 (TWO) TIMES DAILY WITH A MEAL. 09/13/14  Yes [name], MD  oxybutynin (DITROPAN) 5 MG tablet Take 1 tablet (5 mg total) by mouth every 8 (eight) hours as needed for bladder spasms. 06/28/15  Yes Franchot Gallo, MD  pantoprazole (PROTONIX) 40 MG tablet TAKE 1 TABLET BY MOUTH TWICE A DAY 03/16/15  Yes Carlis Stable, NP  PARoxetine (PAXIL) 40 MG tablet TAKE 1 TABLET (40 MG TOTAL) BY MOUTH EVERY MORNING. 07/05/15  Yes [name], MD  potassium citrate (UROCIT-K) 10 MEQ (1080 MG) SR tablet TAKE 1 TABLET 3 TIMES DAILY WITH MEALS. Patient taking differently: Take 10 mEq by mouth 2 (two) times daily.  07/27/14  Yes [name], MD  traMADol (ULTRAM) 50 MG tablet One at bedtime Patient taking differently: Take 50 mg by mouth at bedtime as needed for moderate pain. One at bedtime 03/04/15   Yes Aviva Signs, MD  Vitamin D, Ergocalciferol, (DRISDOL) 50000 UNITS CAPS capsule TAKE 1 CAPSULE (50,000 UNITS TOTAL) BY MOUTH EVERY 7 (SEVEN) DAYS. Patient taking differently: Take 50,000 Units by mouth every Sunday. TAKE 1 CAPSULE (50,000 UNITS TOTAL) BY MOUTH EVERY 7 (SEVEN) DAYS. 08/30/14  Yes [name], MD   Physical Exam: Blood pressure 136/86, pulse 98, temperature 98.2 F (36.8 C), temperature source Oral, height 5\' 6"  (1.676  m), weight 86.637 kg (191 lb), SpO2 96 %. Filed Vitals:   07/12/15 1306  BP: 136/86  Pulse: 98  Temp: 98.2 F (36.8 C)     General:  No acute distress  Eyes: Pupils are equal round reactive to light  ENT: Mucous membranes are moist  Neck: Supple  Cardiovascular: S1, S2, regular rate and rhythm  Respiratory: Clear to auscultation bilaterally  Abdomen: Soft, diffusely tender but more so in the suprapubic region, bowel sounds active  Skin: No visible rashes based on my limited exam  Musculoskeletal: No peripheral edema  Psychiatric: Normal affect, cooperative with exam  Neurologic: Grossly intact, nonfocal  Labs on Admission:  Basic Metabolic Panel:  Recent Labs Lab 07/10/15 1937 07/12/15 1257  NA 135 137  K 4.2 4.8  CL 103 106  CO2 22 24  GLUCOSE 147* 72  BUN 21* 16  CREATININE 1.62* 1.25*  CALCIUM 10.3 9.8   Liver Function Tests:  Recent Labs Lab 07/10/15 1937  AST 39  ALT 18  ALKPHOS 72  BILITOT 0.4  PROT 8.5*  ALBUMIN 3.9   No results for input(s): LIPASE, AMYLASE in the last 168 hours. No results for input(s): AMMONIA in the last 168 hours. CBC:  Recent Labs Lab 07/10/15 1937 07/12/15 1257  WBC 10.1 8.1  NEUTROABS 6.4 5.0  HGB 9.6* 9.0*  HCT 31.9* 29.9*  MCV 77.6* 77.9*  PLT 514* 469*   Cardiac Enzymes: No results for input(s): CKTOTAL, CKMB, CKMBINDEX, TROPONINI in the last 168 hours. BNP: Invalid input(s): POCBNP CBG: No results for input(s): GLUCAP in the last 168 hours.  Radiological  Exams on Admission: Ct Renal Stone Study  07/10/2015  CLINICAL DATA:  Flank pain. Bilateral lower abdominal pain for 1 week. History of cystoscopy with stone extraction 06/28/2015, 12 days prior EXAM: CT ABDOMEN AND PELVIS WITHOUT CONTRAST TECHNIQUE: Multidetector CT imaging of the abdomen and pelvis was performed following the standard protocol without IV contrast. COMPARISON:  Abdominal radiograph 06/07/2015, CT 01/15/2015 FINDINGS: Lower chest: Minimal linear scarring in the right middle and lower lobes. Lung bases otherwise clear. Stable elevation of right hemidiaphragm. Liver: No focal lesion allowing for lack contrast. Hepatobiliary: Gallbladder physiologically distended, no calcified stone. No biliary dilatation. Pancreas: No ductal dilatation or inflammation. Spleen: Normal. Adrenal glands: No nodule. Kidneys: Right ureteral stent, proximal portion in the renal pelvis, distal portion in the bladder. No stone fragments about the stent or within the ureter. Mild prominence of the right renal pelvis and proximal ureter with adjacent fat stranding. Small stones or stone fragments in the right lower, interpolar, and upper kidney measuring from 4-7 mm. No stones in the urinary bladder. Cortical scarring and multiple nonobstructing left renal calculi are unchanged. There is a 4 mm stone in the left pelvis, localizing to the distal left ureter, however no proximal hydroureteronephrosis. There is no left perinephric stranding. Stomach/Bowel: Stomach physiologically distended with ingested contents. There are no dilated or thickened small bowel loops. High-density material in the cecum is likely related to ingested contents. Small volume of stool throughout the colon without colonic wall thickening. Scattered diverticulosis of the distal colon from the splenic flexure through the sigmoid, no diverticulitis. The appendix is normal. Vascular/Lymphatic: No retroperitoneal adenopathy. Abdominal aorta is normal in  caliber. Mild atherosclerosis of the aorta without aneurysm. Reproductive: Uterus surgically absent.  No adnexal mass. Bladder: Physiologically distended. No bladder stone. No stone fragments. No wall thickening. Other: No free air, free fluid, or intra-abdominal fluid collection. Small fat containing  umbilical hernia. Musculoskeletal: There are no acute or suspicious osseous abnormalities. IMPRESSION: 1. Right ureteral stent in place. Mild prominence of the right renal pelvis and peripelvic soft tissue stranding, likely secondary to recent procedure. No stone fragments along the course of the ureteral stent. Nonobstructing stones/stone fragments remain in the right kidney, largest measuring 7 mm. 2. Apparent 4 mm stone in the distal left ureter, however no proximal hydroureteronephrosis or inflammatory changes about the left kidney. This is likely nonobstructing. Additional nonobstructing left renal calculi are seen. 3. Colonic diverticulosis without diverticulitis. Electronically Signed   By: Jeb Levering M.D.   On: 07/10/2015 20:29    Time spent: 47mins  Cheryl Abbott [facility] Pager [phone]  If 7PM-7AM, please contact night-coverage [email or web address] Password [email or web address] 07/12/2015, 7:01 PM

## 2015-07-13 ENCOUNTER — Encounter (HOSPITAL_COMMUNITY)
Admission: RE | Admit: 2015-07-13 | Discharge: 2015-07-13 | Disposition: A | Discharge status: Home / Self Care | Payer: Medicare Other | Source: Ambulatory Visit | Attending: Gastroenterology | Admitting: Gastroenterology

## 2015-07-13 ENCOUNTER — Other Ambulatory Visit (HOSPITAL_COMMUNITY): Payer: Medicare Other

## 2015-07-13 ENCOUNTER — Inpatient Hospital Stay (HOSPITAL_COMMUNITY): Admission: RE | Admit: 2015-07-13 | Payer: Medicare Other | Source: Ambulatory Visit

## 2015-07-13 DIAGNOSIS — N390 Urinary tract infection, site not specified: Principal | ICD-10-CM

## 2015-07-13 DIAGNOSIS — E785 Hyperlipidemia, unspecified: Secondary | ICD-10-CM

## 2015-07-13 DIAGNOSIS — E1121 Type 2 diabetes mellitus with diabetic nephropathy: Secondary | ICD-10-CM

## 2015-07-13 DIAGNOSIS — R1031 Right lower quadrant pain: Secondary | ICD-10-CM

## 2015-07-13 DIAGNOSIS — N201 Calculus of ureter: Secondary | ICD-10-CM

## 2015-07-13 DIAGNOSIS — K219 Gastro-esophageal reflux disease without esophagitis: Secondary | ICD-10-CM

## 2015-07-13 DIAGNOSIS — R110 Nausea: Secondary | ICD-10-CM

## 2015-07-13 DIAGNOSIS — R3982 Chronic bladder pain: Secondary | ICD-10-CM

## 2015-07-13 LAB — GLUCOSE, CAPILLARY
Glucose-Capillary: 86 mg/dL (ref 65–99)
Glucose-Capillary: 143 mg/dL — ABNORMAL HIGH (ref 65–99)
Glucose-Capillary: 73 mg/dL (ref 65–99)
Glucose-Capillary: 90 mg/dL (ref 65–99)

## 2015-07-13 LAB — BASIC METABOLIC PANEL
Anion gap: 7 (ref 5–15)
BUN: 13 mg/dL (ref 6–20)
CO2: 26 mmol/L (ref 22–32)
Calcium: 9.1 mg/dL (ref 8.9–10.3)
Chloride: 106 mmol/L (ref 101–111)
Creatinine, Ser: 1.03 mg/dL — ABNORMAL HIGH (ref 0.44–1.00)
GFR calc Af Amer: >60 mL/min (ref >60)
GFR calc non Af Amer: 54 mL/min — ABNORMAL LOW (ref >60)
Glucose, Bld: 98 mg/dL (ref 65–99)
Potassium: 3.7 mmol/L (ref 3.5–5.1)
Sodium: 139 mmol/L (ref 135–145)

## 2015-07-13 MED ORDER — DIAZEPAM 5 MG PO TABS
5.0000 mg | ORAL_TABLET | Freq: Three times a day (TID) | ORAL | Status: DC | PRN
  Administered 2015-07-13: 5 mg via ORAL
  Filled 2015-07-13: qty 1

## 2015-07-13 MED ORDER — KETOROLAC TROMETHAMINE 15 MG/ML IJ SOLN
15.0000 mg | Freq: Four times a day (QID) | INTRAMUSCULAR | Status: DC | PRN
  Administered 2015-07-13: 15 mg via INTRAVENOUS
  Filled 2015-07-13: qty 1

## 2015-07-13 NOTE — Care Management Important Message (Signed)
Important Message  Patient Details  Name: SCOTLAND EXLEY MRN: [id number] Date of Birth: 16-May-1946   Medicare Important Message Given:  Yes    Alvie Heidelberg, RN 07/13/2015, 2:01 PM

## 2015-07-13 NOTE — Progress Notes (Signed)
[facility] PROGRESS NOTE  Cheryl Abbott [id number] DOB: 12/03/1945 DOA: 07/12/2015 PCP: Tula Nakayama, MD  HPI/Brief narrative Please see admit h and p from 1/31 for details. Briefly, 70yo with hx of dm2 s/p recent ureteroscopic mgt for R renal calculi presented with increasing abd pain and urinary frequency/urgency with concerns for UTI.  Assessment/Plan: 1. UTI. Pt is continued on ciprofloxacin IV. Remains afebrile without leukocytosis. Urine cx unremarkable. 2. CKD stage III. Creatinine appears to be improving and is at baseline. Continue to follow. 3. Hypertension. Stable. Continue outpatient regimen. 4. Hyperlipidemia. Continue statin. 5. GERD. Continue PPI and as needed Maalox. 6. Right renal calculus. Does not appear to be obstructing at this time. Urology following. Continue analgesics as tolerated. Will order trial of toradol. Muscle relaxant ordered by Urology as well 7. Diabetes. Continue metformin and start sliding scale insulin.  Code Status: Full Family Communication: Pt in room, family at bedside Disposition Plan: Possible d/c 2/2 if stable   Consultants:  Urology  Procedures:    Antibiotics: Anti-infectives    Start     Dose/Rate Route Frequency Ordered Stop   07/12/15 1230  ciprofloxacin (CIPRO) IVPB 400 mg     400 mg 200 mL/hr over 60 Minutes Intravenous Every 12 hours 07/12/15 1228        HPI/Subjective: Complaining of continued pubic/lower quadrant pain  Objective: Filed Vitals:   07/12/15 1306 07/12/15 2114 07/13/15 0551 07/13/15 1452  BP: 136/86 134/72 129/77 134/81  Pulse: 98 87 91 84  Temp: 98.2 F (36.8 C) 99.5 F (37.5 C) 99 F (37.2 C) 98.9 F (37.2 C)  TempSrc: Oral Oral Oral Oral  Resp:    18  Height: 5\' 6"  (1.676 m)     Weight: 86.637 kg (191 lb)     SpO2: 96% 97% 96% 97%    Intake/Output Summary (Last 24 hours) at 07/13/15 1603 Last data filed at 07/13/15 1300  Gross per 24 hour  Intake   2695 ml  Output       0 ml  Net   2695 ml   Filed Weights   07/12/15 1306  Weight: 86.637 kg (191 lb)    Exam:   General:  Awake, in nad  Cardiovascular: regular, s1, s2  Respiratory: normal resp effort, no wheezing  Abdomen: soft, mild tenderness in lower quadrants  Musculoskeletal: perfused, no clubbing   Data Reviewed: Basic Metabolic Panel:  Recent Labs Lab 07/10/15 1937 07/12/15 1257 07/13/15 0626  NA 135 137 139  K 4.2 4.8 3.7  CL 103 106 106  CO2 22 24 26   GLUCOSE 147* 72 98  BUN 21* 16 13  CREATININE 1.62* 1.25* 1.03*  CALCIUM 10.3 9.8 9.1   Liver Function Tests:  Recent Labs Lab 07/10/15 1937  AST 39  ALT 18  ALKPHOS 72  BILITOT 0.4  PROT 8.5*  ALBUMIN 3.9   No results for input(s): LIPASE, AMYLASE in the last 168 hours. No results for input(s): AMMONIA in the last 168 hours. CBC:  Recent Labs Lab 07/10/15 1937 07/12/15 1257  WBC 10.1 8.1  NEUTROABS 6.4 5.0  HGB 9.6* 9.0*  HCT 31.9* 29.9*  MCV 77.6* 77.9*  PLT 514* 469*   Cardiac Enzymes: No results for input(s): CKTOTAL, CKMB, CKMBINDEX, TROPONINI in the last 168 hours. BNP (last 3 results) No results for input(s): BNP in the last 8760 hours.  ProBNP (last 3 results) No results for input(s): PROBNP in the last 8760 hours.  CBG:  Recent Labs Lab  07/12/15 2159 07/13/15 0724 07/13/15 1105  GLUCAP 122* 86 143*    Recent Results (from the past 240 hour(s))  Urine culture     Status: None   Collection Time: 07/10/15  8:40 PM  Result Value Ref Range Status   Specimen Description URINE, CATHETERIZED  Final   Special Requests NONE  Final   Culture   Final    NO GROWTH 1 DAY Performed at [facility]    Report Status 07/12/2015 FINAL  Final     Studies: No results found.  Scheduled Meds: . ALPRAZolam  0.5 mg Oral BID  . amLODipine  10 mg Oral q morning - 10a  . aspirin EC  81 mg Oral Daily  . ciprofloxacin  400 mg Intravenous Q12H  . docusate sodium  100 mg Oral BID  .  enoxaparin (LOVENOX) injection  30 mg Subcutaneous Q12H  . gabapentin  100 mg Oral QHS  . insulin aspart  0-15 Units Subcutaneous TID WC  . insulin aspart  0-5 Units Subcutaneous QHS  . metFORMIN  1,000 mg Oral BID WC  . pantoprazole  40 mg Oral BID  . PARoxetine  40 mg Oral Daily  . pravastatin  80 mg Oral q1800   Continuous Infusions: . sodium chloride 75 mL/hr at 07/13/15 [id number]    Principal Problem:   Acute UTI Active Problems:   Type 2 diabetes with nephropathy ([facility])   Hyperlipidemia LDL goal <100   Hypertension goal BP (blood pressure) < 130/80   GERD   RENAL CALCULUS   CKD (chronic kidney disease) stage 3, GFR 30-59 ml/min   Cheryl Abbott K  [facility] Pager [phone]. If 7PM-7AM, please contact night-coverage at [email or web address], password [email or web address] 07/13/2015, 4:03 PM  LOS: 1 day

## 2015-07-13 NOTE — Progress Notes (Signed)
RN paged this NP because pt wants her IV d'c'd and her antibiotics changed to po. The pt was just admitted to the hospital for UTI after failing outpt therapy. Per medical MD and urology MD note, pt needs IV abx while hospitalized. Asked RN to explain that she needs to keep her IV and her IV meds until discharged.  KJKG, NP [facility]

## 2015-07-13 NOTE — Progress Notes (Signed)
  Subjective: Patient reports continued suprapubic pain which is relieved by narcotics and oxybutynin. Urine culture shows no growth. Hematuria resolved   Objective: Vital signs in last 24 hours: Temp:  [99 F (37.2 C)-99.5 F (37.5 C)] 99 F (37.2 C) (02/01 0551) Pulse Rate:  [87-91] 91 (02/01 0551) BP: (129-134)/(72-77) 129/77 mmHg (02/01 0551) SpO2:  [96 %-97 %] 96 % (02/01 0551)  Intake/Output from previous day: 01/31 0701 - 02/01 0700 In: 2415 [P.O.:720; I.V.:1295; IV Piggyback:400] Out: -  Intake/Output this shift: Total I/O In: 720 [P.O.:720] Out: -   Physical Exam:  General:alert, cooperative and appears stated age GI: soft, non tender, normal bowel sounds, no palpable masses, no organomegaly, no inguinal hernia Female genitalia: not done Extremities: extremities normal, atraumatic, no cyanosis or edema  Lab Results:  Recent Labs  07/10/15 1937 07/12/15 1257  HGB 9.6* 9.0*  HCT 31.9* 29.9*   BMET  Recent Labs  07/12/15 1257 07/13/15 0626  NA 137 139  K 4.8 3.7  CL 106 106  CO2 24 26  GLUCOSE 72 98  BUN 16 13  CREATININE 1.25* 1.03*  CALCIUM 9.8 9.1   No results for input(s): LABPT, INR in the last 72 hours. No results for input(s): LABURIN in the last 72 hours. Results for orders placed or performed during the hospital encounter of 07/10/15  Urine culture     Status: None   Collection Time: 07/10/15  8:40 PM  Result Value Ref Range Status   Specimen Description URINE, CATHETERIZED  Final   Special Requests NONE  Final   Culture   Final    NO GROWTH 1 DAY Performed at [facility]    Report Status 07/12/2015 FINAL  Final    Studies/Results: No results found.  Assessment/Plan: 70yo with left ureteral calculus, right stent colic  Recs: 1. Left ureteral calculus: Continue medical expulsive therapy. continue flomax and narcotics 2. Right stent colic: Continue flomax, oxybutynin, percocet. Add Valium 5mg  TID PRN for spasms Pt's  urine culture is currently negative and if it remains negative and her pain is controlled tomorrow she can be discharged home. She will need to followup with Dr. Diona Fanti in 1 week after discharge   LOS: 1 day   MCKENZIE, [place] 07/13/2015, 1:41 PM

## 2015-07-13 NOTE — Care Management Note (Signed)
Case Management Note  Patient Details  Name: Cheryl Abbott MRN: [id number] Date of Birth: 19-Jun-1945  Subjective/Objective:         Spoke with patient for discharge planning. Patient is independent from home by herself. Uses a cane for ambulation. No O2 or other DME. Alert and oriented. Denies andy needs with medications. No CM needs identified.           Action/Plan:  Home with Self care.   Expected Discharge Date:                  Expected Discharge Plan:  Home/Self Care  In-House Referral:     Discharge planning Services  CM Consult  Post Acute Care Choice:    Choice offered to:     DME Arranged:    DME Agency:     HH Arranged:    [place] Agency:     Status of Service:  Completed, signed off  Medicare Important Message Given:  Yes Date Medicare IM Given:    Medicare IM give by:    Date Additional Medicare IM Given:    Additional Medicare Important Message give by:     If discussed at [place] of Stay Meetings, dates discussed:    Additional Comments:  Alvie Heidelberg, RN 07/13/2015, 3:42 PM

## 2015-07-13 NOTE — Pre-Procedure Instructions (Signed)
While reviewing chart for PAT phone call, discovered that she is an inpatient admitted for urology. Dr Oneida Alar aware and will review chart to determine if patient should stay on schedule.

## 2015-07-14 DIAGNOSIS — N183 Chronic kidney disease, stage 3 (moderate): Secondary | ICD-10-CM

## 2015-07-14 LAB — GLUCOSE, CAPILLARY
Glucose-Capillary: 87 mg/dL (ref 65–99)
Glucose-Capillary: 78 mg/dL (ref 65–99)

## 2015-07-14 LAB — BASIC METABOLIC PANEL
Anion gap: 7 (ref 5–15)
BUN: 12 mg/dL (ref 6–20)
CO2: 26 mmol/L (ref 22–32)
Calcium: 9.3 mg/dL (ref 8.9–10.3)
Chloride: 106 mmol/L (ref 101–111)
Creatinine, Ser: 0.94 mg/dL (ref 0.44–1.00)
GFR calc Af Amer: >60 mL/min (ref >60)
GFR calc non Af Amer: 60 mL/min — ABNORMAL LOW (ref >60)
Glucose, Bld: 89 mg/dL (ref 65–99)
Potassium: 3.7 mmol/L (ref 3.5–5.1)
Sodium: 139 mmol/L (ref 135–145)

## 2015-07-14 MED ORDER — OXYCODONE HCL 5 MG PO TABS: 5.0000 mg | ORAL_TABLET | ORAL | Status: DC | PRN

## 2015-07-14 MED ORDER — DIAZEPAM 5 MG PO TABS: 5.0000 mg | ORAL_TABLET | Freq: Three times a day (TID) | ORAL | Status: DC | PRN

## 2015-07-14 MED ORDER — KETOROLAC TROMETHAMINE 10 MG PO TABS: 10.0000 mg | ORAL_TABLET | Freq: Four times a day (QID) | ORAL | Status: DC | PRN

## 2015-07-14 MED ORDER — CIPROFLOXACIN HCL 500 MG PO TABS: 500.0000 mg | ORAL_TABLET | Freq: Two times a day (BID) | ORAL | Status: DC

## 2015-07-14 NOTE — Discharge Instructions (Signed)

## 2015-07-14 NOTE — Consult Note (Signed)
   [facility] CM Inpatient Consult   07/14/2015  Cheryl Abbott [birth year] [id number]  Spoke with patient at bedside regarding [facility] services. Patient does not want to participate with [facility] at this time. Patient given [facility] brochure and contact information for future reference.  Of note, [facility] Care Management services would not replace or interfere with any services that are arranged by inpatient case management or social work. For additional questions or referrals please contact:  Royetta Crochet. Laymond Purser, RN, BSN, [facility] Liaison [phone]

## 2015-07-14 NOTE — Progress Notes (Signed)
Discharge instructions reviewed with patient. Understanding verbalized. IV removed. Ready for discharge home.

## 2015-07-14 NOTE — Discharge Summary (Signed)
Physician Discharge Summary  Cheryl Abbott [id number] DOB: 18-Nov-1945 DOA: 07/12/2015  PCP: Tula Nakayama, MD  Admit date: 07/12/2015 Discharge date: 07/14/2015  Time spent: 20 minutes  Recommendations for Outpatient Follow-up:  1. Follow up with PCP in 1-2 weeks 2. Follow up with Dr. Diona Fanti in one week  Discharge Diagnoses:  Principal Problem:   Acute UTI Active Problems:   Type 2 diabetes with nephropathy (HCC)   Hyperlipidemia LDL goal <100   Hypertension goal BP (blood pressure) < 130/80   GERD   RENAL CALCULUS   CKD (chronic kidney disease) stage 3, GFR 30-59 ml/min   Discharge Condition: Improved  Diet recommendation: Heart Healthy  Filed Weights   07/12/15 1306  Weight: 86.637 kg (191 lb)    History of present illness:  Please review dictated H and P from 1/31 for details. Briefly, 70yo with hx of dm2 s/p recent ureteroscopic mgt for R renal calculi presented with increasing abd pain and urinary frequency/urgency with concerns for UTI.  Hospital Course:  1. UTI. Pt was continued on ciprofloxacin IV while admitted. Patient remained afebrile without leukocytosis. Urine cx unremarkable with no growth. Would complete course of cipro on discharge 2. CKD stage III. Creatinine improved and is at baseline. 3. Hypertension. Stable. Continue outpatient regimen. 4. Hyperlipidemia. Continue statin. 5. GERD. Continue PPI and as needed Maalox. 6. Right renal calculus. Does not appear to be obstructing at this time. Urology following. Continue analgesics as tolerated. Will order trial of toradol. Muscle relaxant ordered by Urology as well. Patient to follow up with Urology in one week 7. Diabetes. Continued metformin and sliding scale insulin while admitted  Consultations:  Urology  Discharge Exam: Filed Vitals:   07/13/15 1452 07/13/15 2102 07/14/15 0549 07/14/15 0829  BP: 134/81 123/74 124/60 146/81  Pulse: 84 92 85 88  Temp: 98.9 F (37.2 C) 98.8 F (37.1  C) 98.7 F (37.1 C)   TempSrc: Oral Oral Oral   Resp: 18 20 20    Height:      Weight:      SpO2: 97% 98% 98%     General: Awake, in nad Cardiovascular: regular, s1, s2 Respiratory: normal resp effort, no wheezing  Discharge Instructions     Medication List    STOP taking these medications        HYDROcodone-acetaminophen 5-325 MG tablet  Commonly known as:  NORCO/VICODIN     traMADol 50 MG tablet  Commonly known as:  ULTRAM      TAKE these medications        ALPRAZolam 0.5 MG tablet  Commonly known as:  XANAX  TAKE 1 TABLET BY MOUTH TWICE A DAY     amLODipine 10 MG tablet  Commonly known as:  NORVASC  Take 1 tablet (10 mg total) by mouth daily.     aspirin EC 81 MG tablet  Take 81 mg by mouth daily.     ciprofloxacin 500 MG tablet  Commonly known as:  CIPRO  Take 1 tablet (500 mg total) by mouth 2 (two) times daily.     diazepam 5 MG tablet  Commonly known as:  VALIUM  Take 1 tablet (5 mg total) by mouth every 8 (eight) hours as needed for muscle spasms.     diphenhydramine-acetaminophen 25-500 MG Tabs tablet  Commonly known as:  TYLENOL PM  Take 1-2 tablets by mouth at bedtime as needed (for sleep/pain).     diphenoxylate-atropine 2.5-0.025 MG tablet  Commonly known as:  LOMOTIL  Take  1 tablet by mouth 4 (four) times daily as needed for diarrhea or loose stools.     docusate sodium 100 MG capsule  Commonly known as:  COLACE  Take 200 mg by mouth at bedtime.     gabapentin 100 MG capsule  Commonly known as:  NEURONTIN  TAKE 1 CAPSULE (100 MG TOTAL) BY MOUTH AT BEDTIME.     ketorolac 10 MG tablet  Commonly known as:  TORADOL  Take 1 tablet (10 mg total) by mouth every 6 (six) hours as needed.     lovastatin 40 MG tablet  Commonly known as:  MEVACOR  TAKE 2 TABLETS (80 MG TOTAL) BY MOUTH AT BEDTIME.     metFORMIN 500 MG tablet  Commonly known as:  GLUCOPHAGE  TAKE 2 TABLETS (1,000 MG TOTAL) BY MOUTH 2 (TWO) TIMES DAILY WITH A MEAL.      oxybutynin 5 MG tablet  Commonly known as:  DITROPAN  Take 1 tablet (5 mg total) by mouth every 8 (eight) hours as needed for bladder spasms.     oxyCODONE 5 MG immediate release tablet  Commonly known as:  Oxy IR/ROXICODONE  Take 1 tablet (5 mg total) by mouth every 4 (four) hours as needed for moderate pain.     pantoprazole 40 MG tablet  Commonly known as:  PROTONIX  TAKE 1 TABLET BY MOUTH TWICE A DAY     PARoxetine 40 MG tablet  Commonly known as:  PAXIL  TAKE 1 TABLET (40 MG TOTAL) BY MOUTH EVERY MORNING.     potassium citrate 10 MEQ (1080 MG) SR tablet  Commonly known as:  UROCIT-K  TAKE 1 TABLET 3 TIMES DAILY WITH MEALS.     Vitamin D (Ergocalciferol) 50000 units Caps capsule  Commonly known as:  DRISDOL  TAKE 1 CAPSULE (50,000 UNITS TOTAL) BY MOUTH EVERY 7 (SEVEN) DAYS.       Allergies  Allergen Reactions  . Ace Inhibitors Cough  . Keflex [Cephalexin] Diarrhea and Nausea And Vomiting  . Nitrofurantoin Diarrhea and Nausea And Vomiting  . Penicillins Hives, Rash and Other (See Comments)    Blisters on hands and feet Has patient had a PCN reaction causing immediate rash, facial/tongue/throat swelling, SOB or lightheadedness with hypotension: Yes Has patient had a PCN reaction causing severe rash involving mucus membranes or skin necrosis: No Has patient had a PCN reaction that required hospitalization No Has patient had a PCN reaction occurring within the last 10 years: No If all of the above answers are "NO", then may proceed with Cephalosporin use.    Follow-up Information    Follow up with Tula Nakayama, MD. Schedule an appointment as soon as possible for a visit in 1 week.   Specialty:  Family Medicine   Why:  Hospital follow up   Contact information:   [address] [place] [address] [phone]       Follow up with Jorja Loa, MD. Schedule an appointment as soon as possible for a visit in 1 week.   Specialty:  Urology   Why:   Hospital follow up   Contact information:   [address] [place] [address] [phone]        The results of significant diagnostics from this hospitalization (including imaging, microbiology, ancillary and laboratory) are listed below for reference.    Significant Diagnostic Studies: Dg C-arm 61-120 Min-no Report  06/28/2015  CLINICAL DATA: stone C-ARM 61-120 MINUTES Fluoroscopy was utilized by  the requesting physician.  No radiographic interpretation.   Ct Renal Stone Study  07/10/2015  CLINICAL DATA:  Flank pain. Bilateral lower abdominal pain for 1 week. History of cystoscopy with stone extraction 06/28/2015, 12 days prior EXAM: CT ABDOMEN AND PELVIS WITHOUT CONTRAST TECHNIQUE: Multidetector CT imaging of the abdomen and pelvis was performed following the standard protocol without IV contrast. COMPARISON:  Abdominal radiograph 06/07/2015, CT 01/15/2015 FINDINGS: Lower chest: Minimal linear scarring in the right middle and lower lobes. Lung bases otherwise clear. Stable elevation of right hemidiaphragm. Liver: No focal lesion allowing for lack contrast. Hepatobiliary: Gallbladder physiologically distended, no calcified stone. No biliary dilatation. Pancreas: No ductal dilatation or inflammation. Spleen: Normal. Adrenal glands: No nodule. Kidneys: Right ureteral stent, proximal portion in the renal pelvis, distal portion in the bladder. No stone fragments about the stent or within the ureter. Mild prominence of the right renal pelvis and proximal ureter with adjacent fat stranding. Small stones or stone fragments in the right lower, interpolar, and upper kidney measuring from 4-7 mm. No stones in the urinary bladder. Cortical scarring and multiple nonobstructing left renal calculi are unchanged. There is a 4 mm stone in the left pelvis, localizing to the distal left ureter, however no proximal hydroureteronephrosis. There is no left perinephric stranding. Stomach/Bowel: Stomach  physiologically distended with ingested contents. There are no dilated or thickened small bowel loops. High-density material in the cecum is likely related to ingested contents. Small volume of stool throughout the colon without colonic wall thickening. Scattered diverticulosis of the distal colon from the splenic flexure through the sigmoid, no diverticulitis. The appendix is normal. Vascular/Lymphatic: No retroperitoneal adenopathy. Abdominal aorta is normal in caliber. Mild atherosclerosis of the aorta without aneurysm. Reproductive: Uterus surgically absent.  No adnexal mass. Bladder: Physiologically distended. No bladder stone. No stone fragments. No wall thickening. Other: No free air, free fluid, or intra-abdominal fluid collection. Small fat containing umbilical hernia. Musculoskeletal: There are no acute or suspicious osseous abnormalities. IMPRESSION: 1. Right ureteral stent in place. Mild prominence of the right renal pelvis and peripelvic soft tissue stranding, likely secondary to recent procedure. No stone fragments along the course of the ureteral stent. Nonobstructing stones/stone fragments remain in the right kidney, largest measuring 7 mm. 2. Apparent 4 mm stone in the distal left ureter, however no proximal hydroureteronephrosis or inflammatory changes about the left kidney. This is likely nonobstructing. Additional nonobstructing left renal calculi are seen. 3. Colonic diverticulosis without diverticulitis. Electronically Signed   By: Jeb Levering M.D.   On: 07/10/2015 20:29    Microbiology: Recent Results (from the past 240 hour(s))  Urine culture     Status: None   Collection Time: 07/10/15  8:40 PM  Result Value Ref Range Status   Specimen Description URINE, CATHETERIZED  Final   Special Requests NONE  Final   Culture   Final    NO GROWTH 1 DAY Performed at [facility]    Report Status 07/12/2015 FINAL  Final     Labs: Basic Metabolic Panel:  Recent Labs Lab  07/10/15 1937 07/12/15 1257 07/13/15 0626 07/14/15 0611  NA 135 137 139 139  K 4.2 4.8 3.7 3.7  CL 103 106 106 106  CO2 22 24 26 26   GLUCOSE 147* 72 98 89  BUN 21* 16 13 12   CREATININE 1.62* 1.25* 1.03* 0.94  CALCIUM 10.3 9.8 9.1 9.3   Liver Function Tests:  Recent Labs Lab 07/10/15 1937  AST 39  ALT 18  ALKPHOS  72  BILITOT 0.4  PROT 8.5*  ALBUMIN 3.9   No results for input(s): LIPASE, AMYLASE in the last 168 hours. No results for input(s): AMMONIA in the last 168 hours. CBC:  Recent Labs Lab 07/10/15 1937 07/12/15 1257  WBC 10.1 8.1  NEUTROABS 6.4 5.0  HGB 9.6* 9.0*  HCT 31.9* 29.9*  MCV 77.6* 77.9*  PLT 514* 469*   Cardiac Enzymes: No results for input(s): CKTOTAL, CKMB, CKMBINDEX, TROPONINI in the last 168 hours. BNP: BNP (last 3 results) No results for input(s): BNP in the last 8760 hours.  ProBNP (last 3 results) No results for input(s): PROBNP in the last 8760 hours.  CBG:  Recent Labs Lab 07/13/15 0724 07/13/15 1105 07/13/15 1646 07/13/15 2102 07/14/15 0728  GLUCAP 86 143* 73 90 87     Signed:  Kennth Vanbenschoten K  [facility] 07/14/2015, 11:38 AM

## 2015-07-15 IMAGING — CR DG SHOULDER 2+V*R*
3 series · 3 of 3 positions shown · non-contrast
Comparison: MRI 08/30/2005.

CLINICAL DATA: Right shoulder pain.  No known injury.

RIGHT SHOULDER - 2+ VIEW

[view not recorded (1 of 3)]
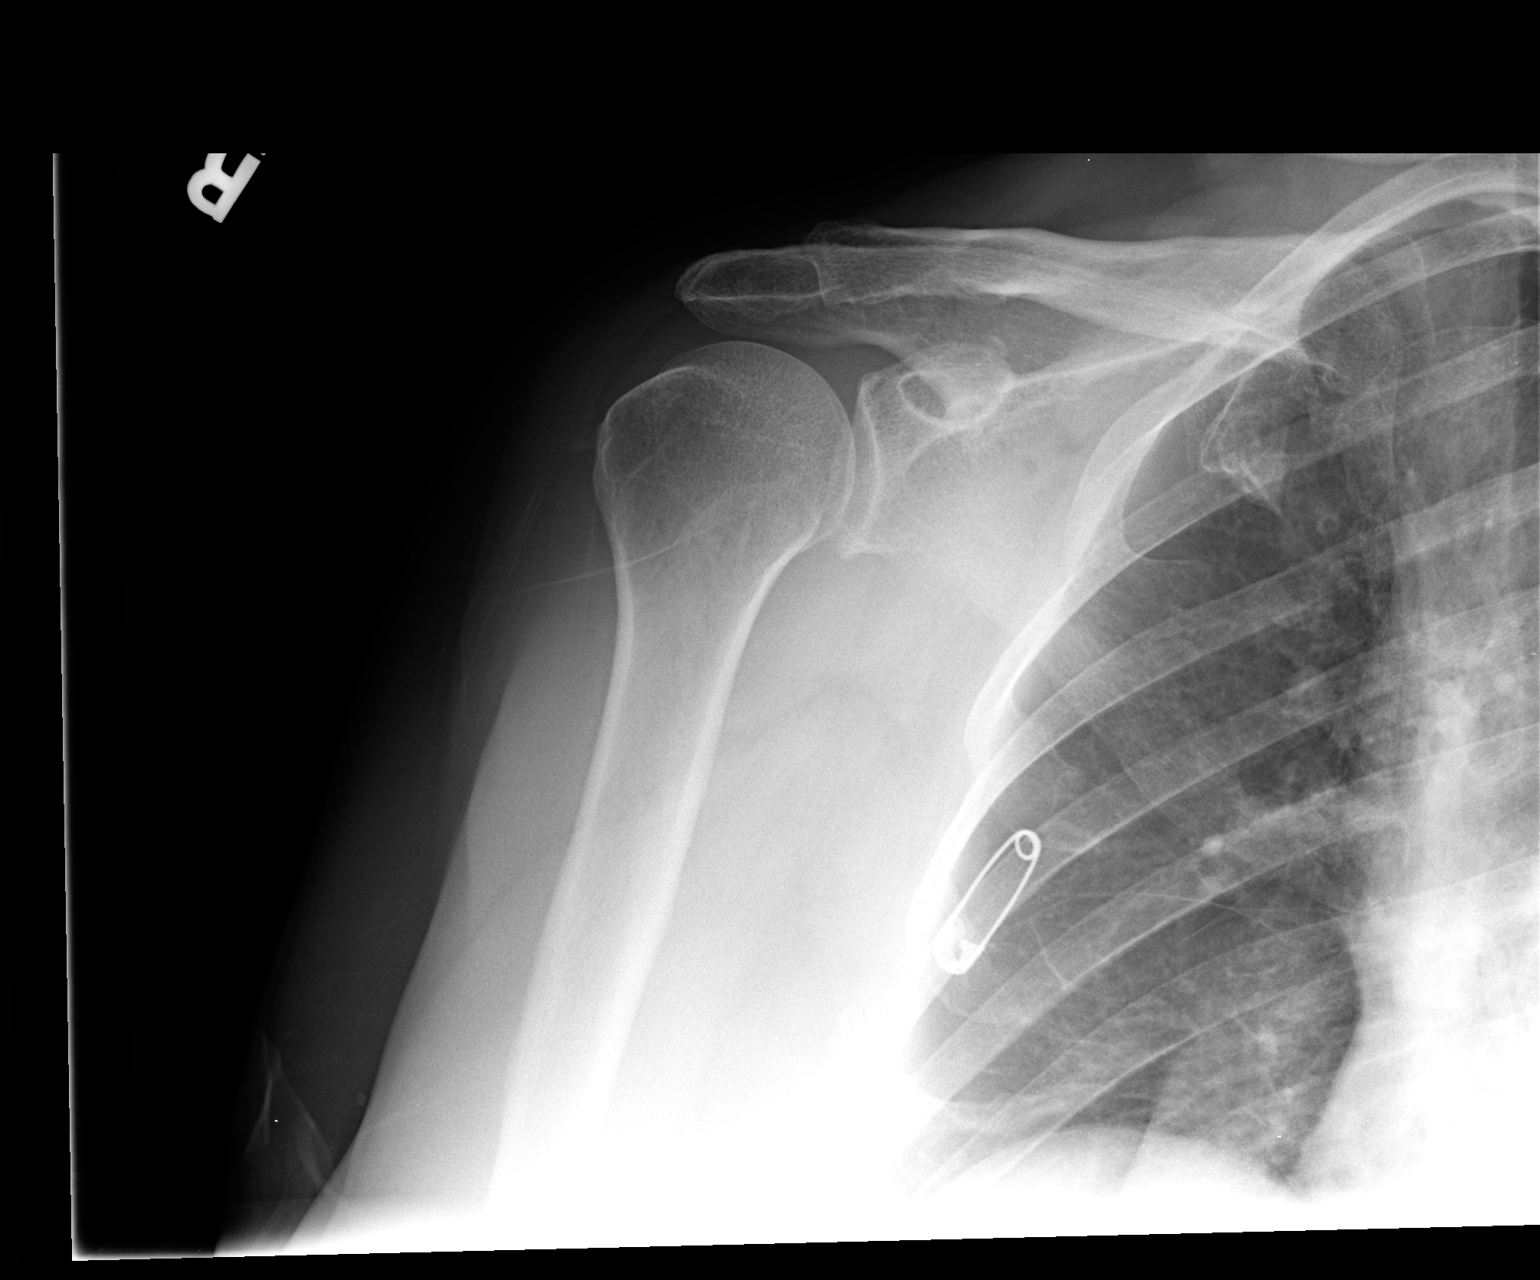

[view not recorded (2 of 3)]
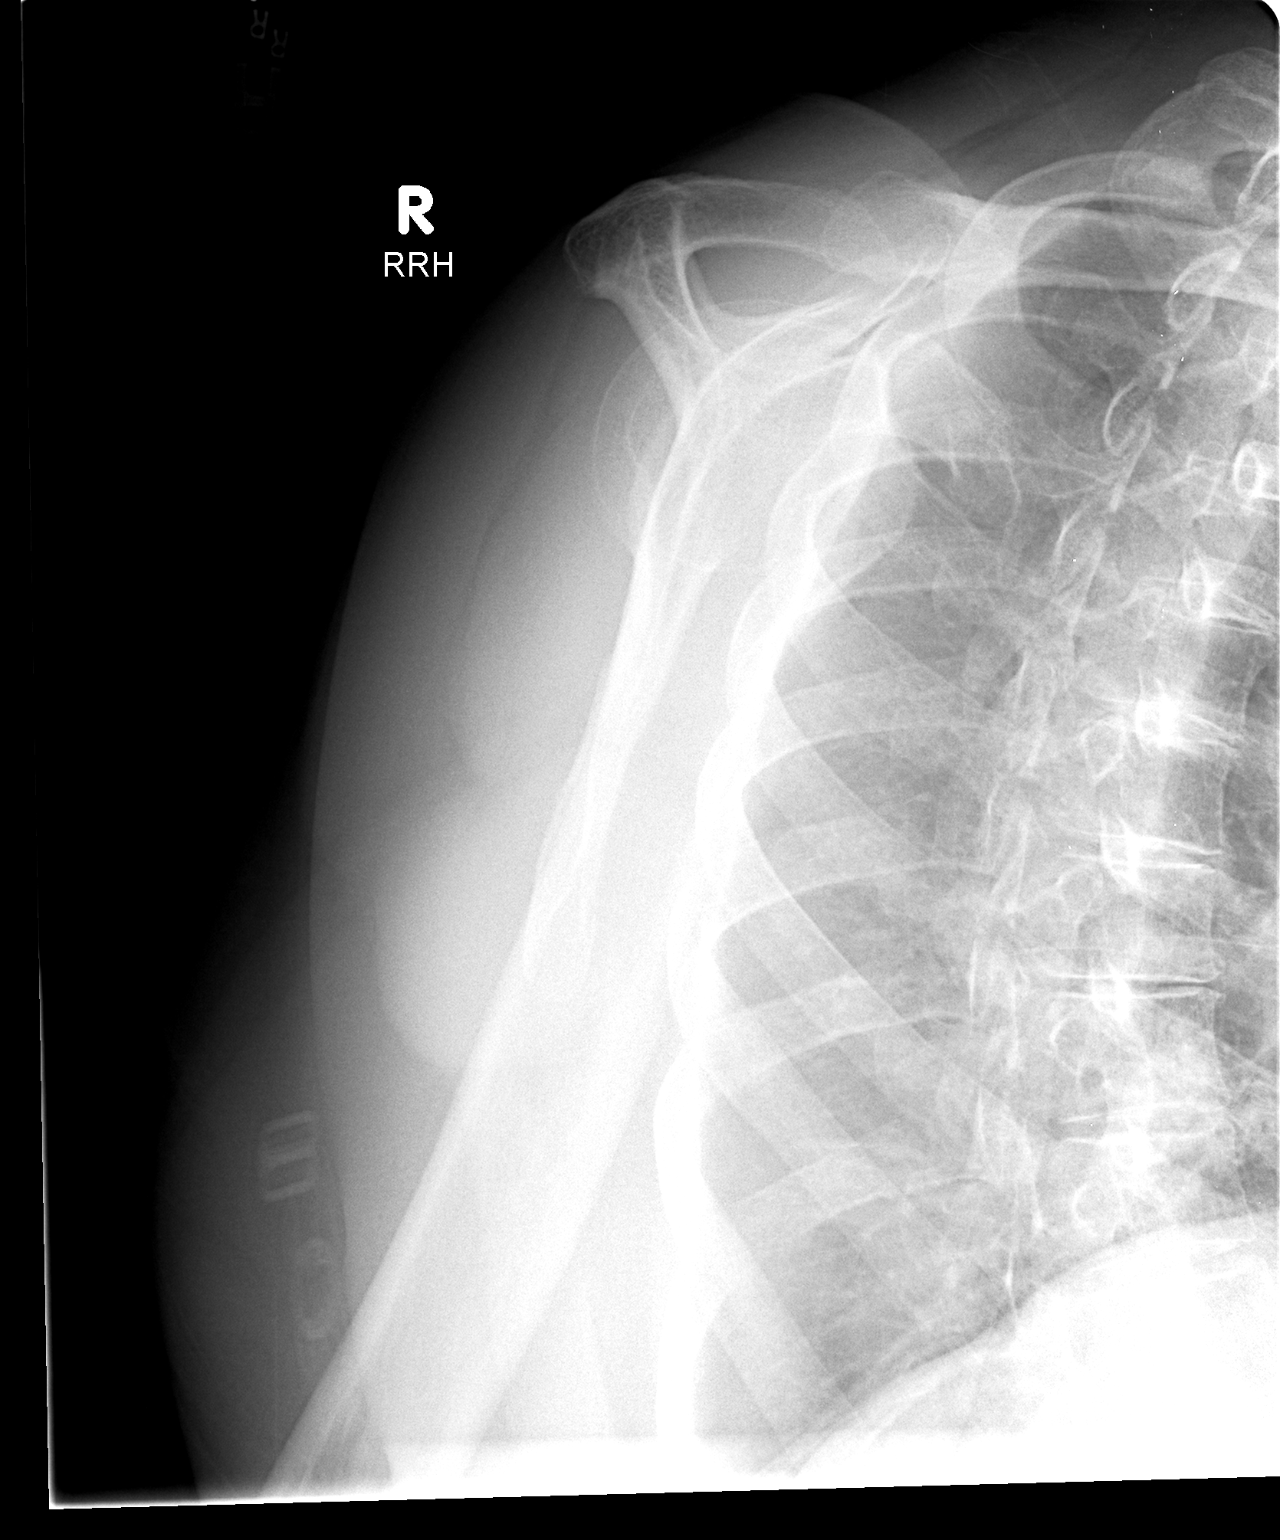

[view not recorded (3 of 3)]
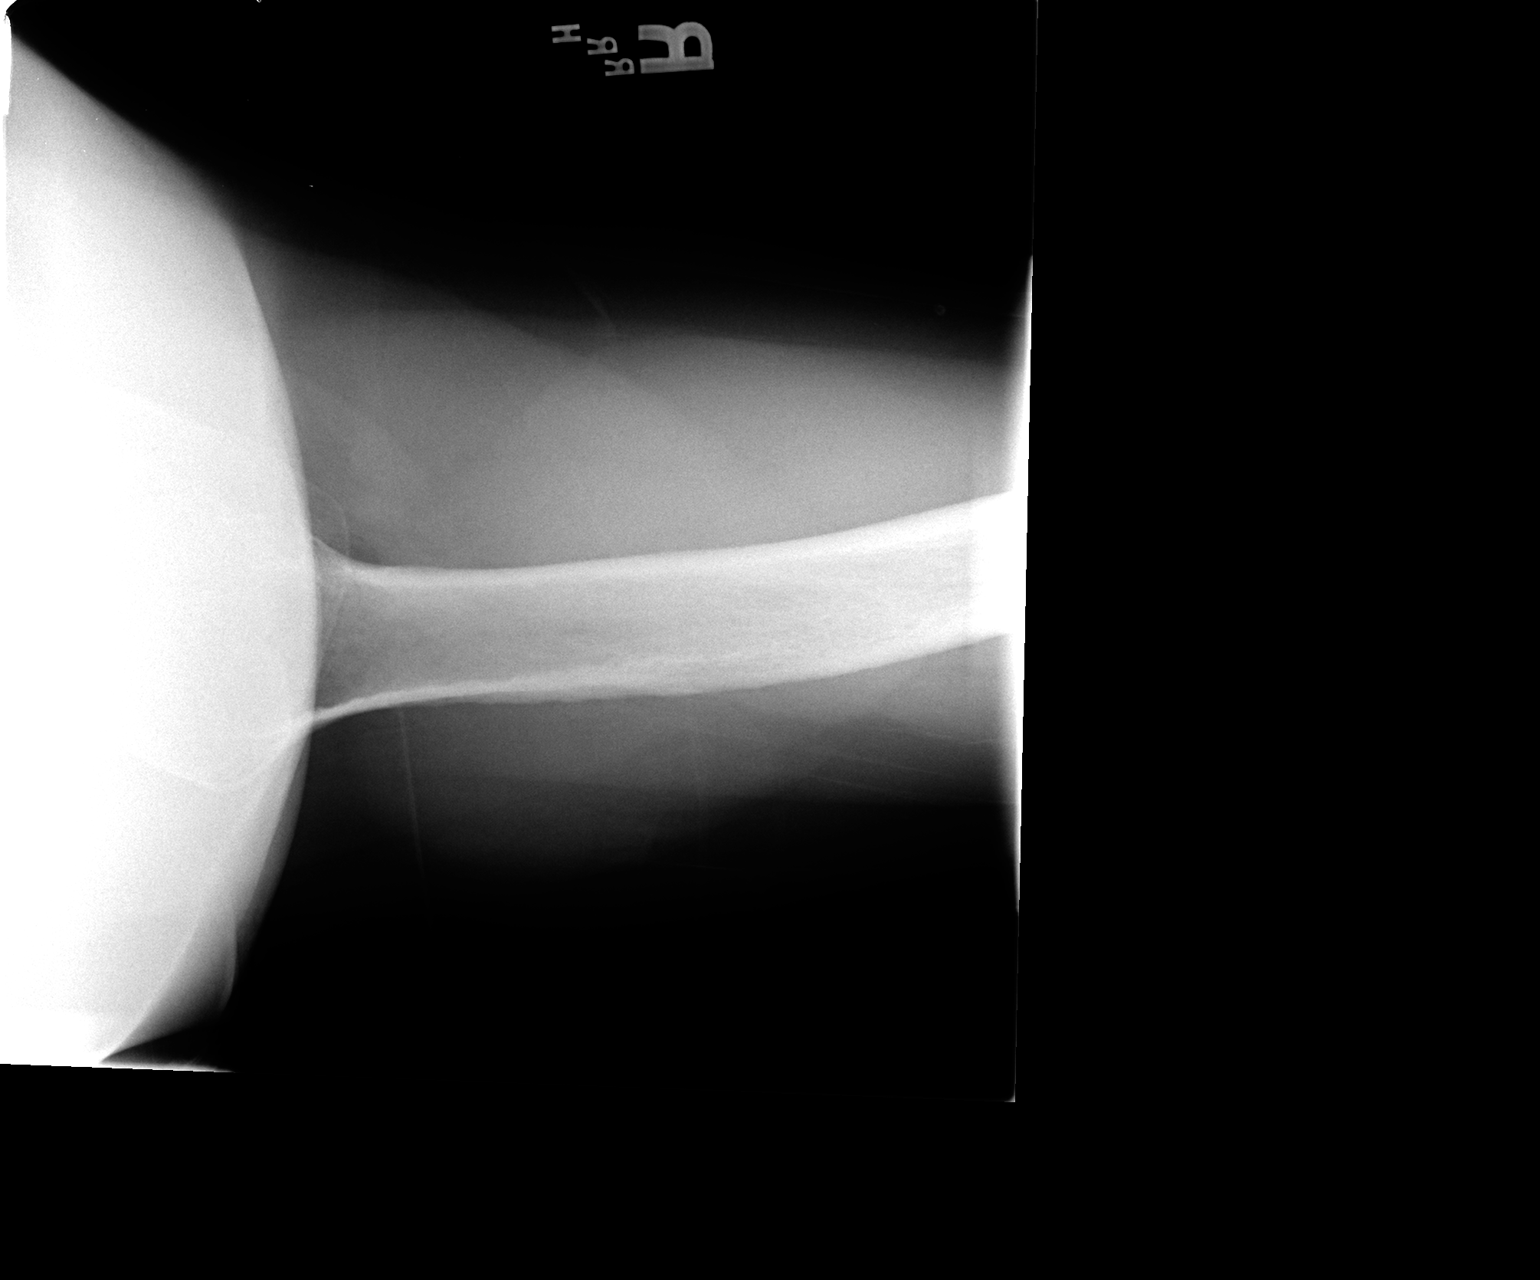

[3 of 3 positions shown; findings below may reference images not displayed]

FINDINGS: Mild glenohumeral osteoarthritis is present with small
inferior glenoid spur. Type 2 acromion.  AC joint appears within
normal limits.  Visualized right chest appears within normal
limits.  Suboptimal axillary view due to soft tissue overlap.
IMPRESSION: Mild glenohumeral osteoarthritis.  No acute abnormality.

## 2015-07-19 ENCOUNTER — Ambulatory Visit (INDEPENDENT_AMBULATORY_CARE_PROVIDER_SITE_OTHER): Payer: Medicare Other | Admitting: Urology

## 2015-07-19 ENCOUNTER — Other Ambulatory Visit: Payer: Self-pay

## 2015-07-19 ENCOUNTER — Telehealth: Payer: Self-pay

## 2015-07-19 DIAGNOSIS — N201 Calculus of ureter: Secondary | ICD-10-CM

## 2015-07-19 SURGERY — COLONOSCOPY WITH PROPOFOL
Anesthesia: Monitor Anesthesia Care

## 2015-07-19 NOTE — Telephone Encounter (Signed)
Called pt and spoke with her about rescheduling her procedure. Reviewed medication and no changes noted. Pt is set for TCS on 08/09/2015 @ 1130am

## 2015-07-19 NOTE — Telephone Encounter (Signed)
REVIEWED-NO ADDITIONAL RECOMMENDATIONS. 

## 2015-07-20 ENCOUNTER — Emergency Department (HOSPITAL_COMMUNITY)
Admission: EM | Admit: 2015-07-20 | Discharge: 2015-07-21 | Disposition: A | Discharge status: Home / Self Care | Payer: Medicare Other | Attending: Emergency Medicine | Admitting: Emergency Medicine

## 2015-07-20 ENCOUNTER — Encounter (HOSPITAL_COMMUNITY): Payer: Self-pay | Admitting: *Deleted

## 2015-07-20 DIAGNOSIS — Z87442 Personal history of urinary calculi: Secondary | ICD-10-CM | POA: Diagnosis not present

## 2015-07-20 DIAGNOSIS — G4733 Obstructive sleep apnea (adult) (pediatric): Secondary | ICD-10-CM | POA: Diagnosis not present

## 2015-07-20 DIAGNOSIS — Z79899 Other long term (current) drug therapy: Secondary | ICD-10-CM | POA: Insufficient documentation

## 2015-07-20 DIAGNOSIS — M1990 Unspecified osteoarthritis, unspecified site: Secondary | ICD-10-CM | POA: Diagnosis not present

## 2015-07-20 DIAGNOSIS — Z87891 Personal history of nicotine dependence: Secondary | ICD-10-CM | POA: Diagnosis not present

## 2015-07-20 DIAGNOSIS — G629 Polyneuropathy, unspecified: Secondary | ICD-10-CM | POA: Diagnosis not present

## 2015-07-20 DIAGNOSIS — Z880 Allergy status to penicillin: Secondary | ICD-10-CM | POA: Diagnosis not present

## 2015-07-20 DIAGNOSIS — Z792 Long term (current) use of antibiotics: Secondary | ICD-10-CM | POA: Insufficient documentation

## 2015-07-20 DIAGNOSIS — F329 Major depressive disorder, single episode, unspecified: Secondary | ICD-10-CM | POA: Insufficient documentation

## 2015-07-20 DIAGNOSIS — I10 Essential (primary) hypertension: Secondary | ICD-10-CM | POA: Diagnosis not present

## 2015-07-20 DIAGNOSIS — R1084 Generalized abdominal pain: Secondary | ICD-10-CM | POA: Diagnosis not present

## 2015-07-20 DIAGNOSIS — Z7982 Long term (current) use of aspirin: Secondary | ICD-10-CM | POA: Insufficient documentation

## 2015-07-20 DIAGNOSIS — G8929 Other chronic pain: Secondary | ICD-10-CM | POA: Insufficient documentation

## 2015-07-20 DIAGNOSIS — Z7984 Long term (current) use of oral hypoglycemic drugs: Secondary | ICD-10-CM | POA: Insufficient documentation

## 2015-07-20 DIAGNOSIS — R1030 Lower abdominal pain, unspecified: Secondary | ICD-10-CM | POA: Diagnosis present

## 2015-07-20 DIAGNOSIS — Z9889 Other specified postprocedural states: Secondary | ICD-10-CM | POA: Diagnosis not present

## 2015-07-20 DIAGNOSIS — E119 Type 2 diabetes mellitus without complications: Secondary | ICD-10-CM | POA: Diagnosis not present

## 2015-07-20 DIAGNOSIS — F419 Anxiety disorder, unspecified: Secondary | ICD-10-CM | POA: Diagnosis not present

## 2015-07-20 DIAGNOSIS — E785 Hyperlipidemia, unspecified: Secondary | ICD-10-CM | POA: Insufficient documentation

## 2015-07-20 DIAGNOSIS — K219 Gastro-esophageal reflux disease without esophagitis: Secondary | ICD-10-CM | POA: Insufficient documentation

## 2015-07-20 DIAGNOSIS — Z9981 Dependence on supplemental oxygen: Secondary | ICD-10-CM | POA: Insufficient documentation

## 2015-07-20 DIAGNOSIS — Z862 Personal history of diseases of the blood and blood-forming organs and certain disorders involving the immune mechanism: Secondary | ICD-10-CM | POA: Diagnosis not present

## 2015-07-20 DIAGNOSIS — Z86010 Personal history of colonic polyps: Secondary | ICD-10-CM | POA: Insufficient documentation

## 2015-07-20 DIAGNOSIS — R339 Retention of urine, unspecified: Secondary | ICD-10-CM | POA: Insufficient documentation

## 2015-07-20 LAB — URINE MICROSCOPIC-ADD ON

## 2015-07-20 LAB — CBC WITH DIFFERENTIAL/PLATELET
Basophils Absolute: 0 10*3/uL (ref 0.0–0.1)
Basophils Relative: 0 %
Eosinophils Absolute: 0.3 10*3/uL (ref 0.0–0.7)
Eosinophils Relative: 3 %
HCT: 30.1 % — ABNORMAL LOW (ref 36.0–46.0)
Hemoglobin: 9.4 g/dL — ABNORMAL LOW (ref 12.0–15.0)
Lymphocytes Relative: 18 %
Lymphs Abs: 1.9 10*3/uL (ref 0.7–4.0)
MCH: 23.8 pg — ABNORMAL LOW (ref 26.0–34.0)
MCHC: 31.2 g/dL (ref 30.0–36.0)
MCV: 76.2 fL — ABNORMAL LOW (ref 78.0–100.0)
Monocytes Absolute: 1.2 10*3/uL — ABNORMAL HIGH (ref 0.1–1.0)
Monocytes Relative: 11 %
Neutro Abs: 7.2 10*3/uL (ref 1.7–7.7)
Neutrophils Relative %: 68 %
Platelets: 491 10*3/uL — ABNORMAL HIGH (ref 150–400)
RBC: 3.95 MIL/uL (ref 3.87–5.11)
RDW: 17.7 % — ABNORMAL HIGH (ref 11.5–15.5)
WBC: 10.6 10*3/uL — ABNORMAL HIGH (ref 4.0–10.5)

## 2015-07-20 LAB — BASIC METABOLIC PANEL
Anion gap: 13 (ref 5–15)
BUN: 19 mg/dL (ref 6–20)
CO2: 22 mmol/L (ref 22–32)
Calcium: 9.8 mg/dL (ref 8.9–10.3)
Chloride: 102 mmol/L (ref 101–111)
Creatinine, Ser: 1.45 mg/dL — ABNORMAL HIGH (ref 0.44–1.00)
GFR calc Af Amer: 41 mL/min — ABNORMAL LOW (ref >60)
GFR calc non Af Amer: 35 mL/min — ABNORMAL LOW (ref >60)
Glucose, Bld: 86 mg/dL (ref 65–99)
Potassium: 4 mmol/L (ref 3.5–5.1)
Sodium: 137 mmol/L (ref 135–145)

## 2015-07-20 LAB — URINALYSIS, ROUTINE W REFLEX MICROSCOPIC
Bilirubin Urine: NEGATIVE
Glucose, UA: NEGATIVE mg/dL
Ketones, ur: NEGATIVE mg/dL
Leukocytes, UA: NEGATIVE
Nitrite: NEGATIVE
Protein, ur: 30 mg/dL — AB
Specific Gravity, Urine: 1.025 (ref 1.005–1.030)
pH: 5.5 (ref 5.0–8.0)

## 2015-07-20 NOTE — ED Provider Notes (Signed)
CSN: [id number]     Arrival date & time 07/20/15  1841 History  By signing my name below, I, Cheryl Abbott, attest that this documentation has been prepared under the direction and in the presence of Daleen Bo, MD. Electronically Signed: Doran Abbott, ED Scribe. 07/20/2015. 9:40 PM.   Chief Complaint  Patient presents with  . Flank Pain   The history is provided by the patient. No language interpreter was used.   HPI Comments: Cheryl Abbott is a 70 y.o. female with a PMHx of HTN, HLD, ureteral stone and kidney stones who presents to the Emergency Department complaining of lower abdominal pain with associated difficulty urinating that began 4 days ago. Pt states she has been eating and drinking with no difficulty. Pt reports normal BM. Pt recently had ureter stents removed with no complications. Pt denies any SOB, cough or any other sx at this time. Pt is compliant with Cipro to treat possible infection.   Past Medical History  Diagnosis Date  . Anxiety disorder   . Hyperlipidemia   . Hypertension   . Depression   . Chronic back pain   . Headache   . Type 2 diabetes mellitus ([place])   . GERD (gastroesophageal reflux disease)   . Anemia   . History of adenomatous polyp of colon     2008  . Fatty liver   . Sigmoid diverticulosis   . Left ureteral stone   . History of kidney stones   . Arthritis   . Hypercalcemia   . OSA treated with BiPAP     per study 2007  . Nephrolithiasis   . Sciatica   . Lumbar disc disease with radiculopathy   . Neuropathy, peripheral [facility])    Past Surgical History  Procedure Laterality Date  . Flexible sigmoidoscopy  09/11/2011    [id number] Internal hemorrhoids  . Esophagogastroduodenoscopy (egd) with propofol N/A 03/17/2013    Procedure: ESOPHAGOGASTRODUODENOSCOPY (EGD) WITH PROPOFOL;  Surgeon: Danie Binder, MD;  Location: [facility];  Service: Endoscopy;  Laterality: N/A;  . Savory dilation N/A 03/17/2013    Procedure: SAVORY DILATION;   Surgeon: Danie Binder, MD;  Location: [facility];  Service: Endoscopy;  Laterality: N/A;  #12.8, 14, 15, 16 dilators used  . Biopsy N/A 03/17/2013    Procedure: GASTRIC BIOPSIES;  Surgeon: Danie Binder, MD;  Location: [facility];  Service: Endoscopy;  Laterality: N/A;  . Polypectomy N/A 03/17/2013    Procedure: GASTRIC POLYPECTOMY;  Surgeon: Danie Binder, MD;  Location: [facility];  Service: Endoscopy;  Laterality: N/A;  . Colonoscopy  last one 10/02/2011    MOD [place] [facility], IH, NEXT TCS APR 2018  . Cysto/   right ureterosocopy laser lithotripsy stone extraction  10-13-2004  . Cysto/  right retrograde pyelogram/  placment right ureteral stent  01-10-2010  . Percutaneous nephrostolithotomy Bilateral 12/  2011     [facility]  . Cardiovascular stress test  01-01-2014    normal lexiscan cardiolite/  no ischemia/ infarct/  normal LV function and wall motion ,  ef 81%  . Transthoracic echocardiogram  01-01-2014    mild LVH/  ef [id number]  grade I diastolic dysfunction/  trivial MR, TR, and PR  . Removal right thigh cyst  2006  . Vaginal hysterectomy  1970's  . Cystoscopy with retrograde pyelogram, ureteroscopy and stent placement Left 10/21/2014    Procedure: CYSTOSCOPY WITH RETROGRADE PYELOGRAM, URETEROSCOPY AND STENT PLACEMENT;  Surgeon: Franchot Gallo, MD;  Location: [facility];  Service: Urology;  Laterality: Left;  . Holmium laser application Left [id number]    Procedure: HOLMIUM LASER APPLICATION;  Surgeon: Franchot Gallo, MD;  Location: [facility];  Service: Urology;  Laterality: Left;  Marland Kitchen Mass excision Left 03/04/2015    Procedure: EXCISION OF SOFT TISSUE NEOPLASM LEFT ARM;  Surgeon: Aviva Signs, MD;  Location: [facility];  Service: General;  Laterality: Left;  . Esophagogastroduodenoscopy (egd) with propofol N/A 06/10/2015    Procedure: ESOPHAGOGASTRODUODENOSCOPY (EGD) WITH PROPOFOL;  Surgeon: Danie Binder, MD;  [facility];  Service: Endoscopy;  Laterality: N/A;   1245 - pt knows to arrive at 8:30  . Savory dilation N/A 06/10/2015    Procedure: SAVORY DILATION;  Surgeon: Danie Binder, MD;  [facility];  Service: Endoscopy;  Laterality: N/A;  . Biopsy  06/10/2015    Procedure: BIOPSY;  Surgeon: Danie Binder, MD;  [facility];  Service: Endoscopy;;  gastric biopsy  . Cardiac catheterization  05-11-2003  dr Shelva Majestic ([facility])    Abnormal cardiolite/   Normal coronary arteries and normal LVF,  ef  63%  . Cystoscopy/retrograde/ureteroscopy/stone extraction with basket Right 06/28/2015    Procedure: CYSTOSCOPY/RETROGRADE/URETEROSCOPY/STONE EXTRACTION WITH BASKET, RIGHT URETERAL DOUBLE J STENT PLACEMENT;  Surgeon: Franchot Gallo, MD;  Location: [facility];  Service: Urology;  Laterality: Right;  . Holmium laser application Right [id number]    Procedure: HOLMIUM LASER APPLICATION;  Surgeon: Franchot Gallo, MD;  Location: [facility];  Service: Urology;  Laterality: Right;   Family History  Problem Relation Age of Onset  . Hypertension Mother   . Diabetes Mother   . Heart failure Mother   . Dementia Mother   . Emphysema Father   . Hypertension Father   . Diabetes Brother   . GER disease Brother   . Cancer      family history   . Diabetes      family history   . Heart defect      famiily history   . Arthritis      family history   . Anesthesia problems Neg Hx   . Hypotension Neg Hx   . Malignant hyperthermia Neg Hx   . Pseudochol deficiency Neg Hx   . Colon cancer Neg Hx   . Hypertension Sister   . Hypertension Sister    Social History  Substance Use Topics  . Smoking status: Former Smoker -- 1.00 packs/day for 20 years    Types: Cigarettes    Quit date: 09/21/1994  . Smokeless tobacco: Never Used  . Alcohol Use: No   OB History    Gravida Para Term Preterm AB TAB SAB Ectopic Multiple Living   2 2 2       2      Review of Systems  Respiratory: Negative for cough and shortness of breath.    Gastrointestinal: Positive for abdominal pain.  Genitourinary: Positive for difficulty urinating.  All other systems reviewed and are negative.  Allergies  Ace inhibitors; Keflex; Nitrofurantoin; and Penicillins  Home Medications   Prior to Admission medications   Medication Sig Start Date End Date Taking? Authorizing Provider  ALPRAZolam Duanne Moron) 0.5 MG tablet TAKE 1 TABLET BY MOUTH TWICE A DAY 05/27/15   [name], MD  amLODipine (NORVASC) 10 MG tablet Take 1 tablet (10 mg total) by mouth daily. Patient taking differently: Take 10 mg by mouth every morning.  09/09/14   [name], MD  aspirin EC 81  MG tablet Take 81 mg by mouth daily.    Historical Provider, MD  ciprofloxacin (CIPRO) 500 MG tablet Take 1 tablet (500 mg total) by mouth 2 (two) times daily. 07/14/15   Donne Hazel, MD  diazepam (VALIUM) 5 MG tablet Take 1 tablet (5 mg total) by mouth every 8 (eight) hours as needed for muscle spasms. 07/14/15   Donne Hazel, MD  diphenhydramine-acetaminophen (TYLENOL PM) 25-500 MG TABS Take 1-2 tablets by mouth at bedtime as needed (for sleep/pain).     Historical Provider, MD  diphenoxylate-atropine (LOMOTIL) 2.5-0.025 MG per tablet Take 1 tablet by mouth 4 (four) times daily as needed for diarrhea or loose stools. 03/01/15   Tanna Furry, MD  docusate sodium (COLACE) 100 MG capsule Take 200 mg by mouth at bedtime.     Historical Provider, MD  gabapentin (NEURONTIN) 100 MG capsule TAKE 1 CAPSULE (100 MG TOTAL) BY MOUTH AT BEDTIME. 07/11/15   [name], MD  lovastatin (MEVACOR) 40 MG tablet TAKE 2 TABLETS (80 MG TOTAL) BY MOUTH AT BEDTIME. 06/01/15   [name], MD  metFORMIN (GLUCOPHAGE) 500 MG tablet TAKE 2 TABLETS (1,000 MG TOTAL) BY MOUTH 2 (TWO) TIMES DAILY WITH A MEAL. 09/13/14   [name], MD  oxybutynin (DITROPAN) 5 MG tablet Take 1 tablet (5 mg total) by mouth every 8 (eight) hours as needed for bladder spasms. 06/28/15   Franchot Gallo, MD   pantoprazole (PROTONIX) 40 MG tablet TAKE 1 TABLET BY MOUTH TWICE A DAY 03/16/15   Carlis Stable, NP  PARoxetine (PAXIL) 40 MG tablet TAKE 1 TABLET (40 MG TOTAL) BY MOUTH EVERY MORNING. 07/05/15   [name], MD  potassium citrate (UROCIT-K) 10 MEQ (1080 MG) SR tablet TAKE 1 TABLET 3 TIMES DAILY WITH MEALS. Patient taking differently: Take 10 mEq by mouth 2 (two) times daily.  07/27/14   [name], MD  traMADol (ULTRAM) 50 MG tablet Take 1 tablet (50 mg total) by mouth every 6 (six) hours as needed. 07/21/15   Daleen Bo, MD  Vitamin D, Ergocalciferol, (DRISDOL) 50000 UNITS CAPS capsule TAKE 1 CAPSULE (50,000 UNITS TOTAL) BY MOUTH EVERY 7 (SEVEN) DAYS. Patient taking differently: Take 50,000 Units by mouth every Sunday. TAKE 1 CAPSULE (50,000 UNITS TOTAL) BY MOUTH EVERY 7 (SEVEN) DAYS. 08/30/14   [name], MD   BP 126/77 mmHg  Pulse 82  Temp(Src) 98.6 F (37 C) (Oral)  Resp 20  Ht 5\' 6"  (1.676 m)  Wt 191 lb (86.637 kg)  BMI 30.84 kg/m2  SpO2 96%   Physical Exam  Constitutional: She is oriented to person, place, and time. She appears well-developed and well-nourished.  HENT:  Head: Normocephalic and atraumatic.  Eyes: Conjunctivae and EOM are normal. Pupils are equal, round, and reactive to light.  Neck: Normal range of motion and phonation normal. Neck supple.  Cardiovascular: Normal rate and regular rhythm.   Pulmonary/Chest: Effort normal and breath sounds normal. She exhibits no tenderness.  Abdominal: Soft. She exhibits no distension. There is tenderness (suprapubic). There is no guarding.  No palpable bladder enlargement   Musculoskeletal: Normal range of motion.  Neurological: She is alert and oriented to person, place, and time. She exhibits normal muscle tone.  Skin: Skin is warm and dry.  Psychiatric: She has a normal mood and affect. Her behavior is normal. Judgment and thought content normal.  Nursing note and vitals reviewed.   ED Course   Procedures   DIAGNOSTIC  STUDIES: Oxygen Saturation is 99% on room air, normal by my interpretation.    COORDINATION OF CARE: 9:19 PM Discussed treatment plan with pt at bedside and pt agreed to plan.   Medications - No data to display  Patient Vitals for the past 24 hrs:  BP Temp Temp src Pulse Resp SpO2 Height Weight  07/20/15 2325 126/77 mmHg 98.6 F (37 C) Oral 82 20 96 % - -  07/20/15 1846 115/62 mmHg 98.5 F (36.9 C) Oral 98 19 99 % 5\' 6"  (1.676 m) 191 lb (86.637 kg)    At discharge- Reevaluation with update and discussion. After initial assessment and treatment, an updated evaluation reveals she reports mild suprapubic pain persists. Urinary catheter is draining well. She was instructed on Foley catheter care, by nursing. All questions answered.Daleen Bo L   Labs Review Labs Reviewed  URINALYSIS, ROUTINE W REFLEX MICROSCOPIC (NOT AT [facility]) - Abnormal; Notable for the following:    Hgb urine dipstick MODERATE (*)    Protein, ur 30 (*)    All other components within normal limits  BASIC METABOLIC PANEL - Abnormal; Notable for the following:    Creatinine, Ser 1.45 (*)    GFR calc non Af Amer 35 (*)    GFR calc Af Amer 41 (*)    All other components within normal limits  CBC WITH DIFFERENTIAL/PLATELET - Abnormal; Notable for the following:    WBC 10.6 (*)    Hemoglobin 9.4 (*)    HCT 30.1 (*)    MCV 76.2 (*)    MCH 23.8 (*)    RDW 17.7 (*)    Platelets 491 (*)    Monocytes Absolute 1.2 (*)    All other components within normal limits  URINE MICROSCOPIC-ADD ON - Abnormal; Notable for the following:    Squamous Epithelial / LPF 0-5 (*)    Bacteria, UA FEW (*)    All other components within normal limits   MDM   Final diagnoses:  Urinary retention    Urinary retention, nonspecific cause. No evidence for acute renal failure surgeon bacterial infection or infected urine.  Nursing Notes Reviewed/ Care Coordinated Applicable Imaging Reviewed Interpretation  of Laboratory Data incorporated into ED treatment  The patient appears reasonably screened and/or stabilized for discharge and I doubt any other medical condition or other [facility] requiring further screening, evaluation, or treatment in the ED at this time prior to discharge.  Plan: Home Medications- usual; Home Treatments- rest; return here if the recommended treatment, does not improve the symptoms; Recommended follow up- Urology 1 week and prn   I personally performed the services described in this documentation, which was scribed in my presence. The recorded information has been reviewed and is accurate.     Daleen Bo, MD 07/21/15 [phone]

## 2015-07-20 NOTE — ED Notes (Signed)
Pt comes in with bilateral flank pain starting 4 days ago with difficulty urinating. However, pt has had minimal fluid intake. Pt recently has 2 kidney stones "broken up" (within the last 2 weeks). NAD noted.

## 2015-07-20 NOTE — ED Notes (Signed)
Noted 55ml of urine in bladder per bladder scan

## 2015-07-21 MED ORDER — TRAMADOL HCL 50 MG PO TABS: 50.0000 mg | ORAL_TABLET | Freq: Four times a day (QID) | ORAL | Status: DC | PRN

## 2015-07-21 NOTE — Discharge Instructions (Signed)
Acute Urinary Retention, Female °Acute urinary retention is the temporary inability to urinate. This is an uncommon problem in women. It can be caused by: °· Infection. °· A side effect of a medicine. °· A problem in a nearby organ that presses or squeezes on the bladder or the urethra (the tube that drains the bladder). °· Psychological problems. °·  Surgery on your bladder, urethra, or pelvic organs that causes obstruction to the outflow of urine from your bladder. °HOME CARE INSTRUCTIONS  °If you are sent home with a Foley catheter and a drainage system, you will need to discuss the best course of action with your health care provider. While the catheter is in, maintain a good intake of fluids. Keep the drainage bag emptied and lower than your catheter. This is so that contaminated urine will not flow back into your bladder, which could lead to a urinary tract infection. °There are two main types of drainage bags. One is a large bag that usually is used at night. It has a good capacity that will allow you to sleep through the night without having to empty it. The second type is called a leg bag. It has a smaller capacity so it needs to be emptied more frequently. However, the main advantage is that it can be attached by a leg strap and goes underneath your clothing, allowing you the freedom to move about or leave your home. °Only take over-the-counter or prescription medicines for pain, discomfort, or fever as directed by your health care provider.  °SEEK MEDICAL CARE IF: °· You develop a low-grade fever. °· You experience spasms or leakage of urine with the spasms. °SEEK IMMEDIATE MEDICAL CARE IF:  °· You develop chills or fever. °· Your catheter stops draining urine. °· Your catheter falls out. °· You start to develop increased bleeding that does not respond to rest and increased fluid intake. °MAKE SURE YOU: °· Understand these instructions. °· Will watch your condition. °· Will get help right away if you are  not doing well or get worse. °  °This information is not intended to replace advice given to you by your health care provider. Make sure you discuss any questions you have with your health care provider. °  °Document Released: 05/27/2006 Document Revised: 10/12/2014 Document Reviewed: 11/06/2012 °Elsevier Interactive Patient Education ©2016 [employer] ° °

## 2015-07-26 ENCOUNTER — Other Ambulatory Visit: Payer: Self-pay | Admitting: Urology

## 2015-07-26 ENCOUNTER — Ambulatory Visit (HOSPITAL_COMMUNITY)
Admission: RE | Admit: 2015-07-26 | Discharge: 2015-07-26 | Disposition: A | Discharge status: Home / Self Care | Payer: Medicare Other | Source: Ambulatory Visit | Attending: Urology | Admitting: Urology

## 2015-07-26 ENCOUNTER — Ambulatory Visit (INDEPENDENT_AMBULATORY_CARE_PROVIDER_SITE_OTHER): Payer: Medicare Other | Admitting: Urology

## 2015-07-26 DIAGNOSIS — R109 Unspecified abdominal pain: Secondary | ICD-10-CM | POA: Insufficient documentation

## 2015-07-26 DIAGNOSIS — N210 Calculus in bladder: Secondary | ICD-10-CM | POA: Diagnosis not present

## 2015-07-26 DIAGNOSIS — N201 Calculus of ureter: Secondary | ICD-10-CM | POA: Diagnosis not present

## 2015-07-26 DIAGNOSIS — K5730 Diverticulosis of large intestine without perforation or abscess without bleeding: Secondary | ICD-10-CM | POA: Insufficient documentation

## 2015-07-26 DIAGNOSIS — N200 Calculus of kidney: Secondary | ICD-10-CM | POA: Diagnosis present

## 2015-07-26 DIAGNOSIS — N132 Hydronephrosis with renal and ureteral calculous obstruction: Secondary | ICD-10-CM | POA: Diagnosis not present

## 2015-07-27 DIAGNOSIS — H2513 Age-related nuclear cataract, bilateral: Secondary | ICD-10-CM | POA: Diagnosis not present

## 2015-07-27 DIAGNOSIS — H524 Presbyopia: Secondary | ICD-10-CM | POA: Diagnosis not present

## 2015-07-27 DIAGNOSIS — E119 Type 2 diabetes mellitus without complications: Secondary | ICD-10-CM | POA: Diagnosis not present

## 2015-07-27 LAB — HM DIABETES EYE EXAM

## 2015-07-29 ENCOUNTER — Ambulatory Visit (INDEPENDENT_AMBULATORY_CARE_PROVIDER_SITE_OTHER): Payer: Medicare Other | Admitting: Urology

## 2015-07-29 DIAGNOSIS — N201 Calculus of ureter: Secondary | ICD-10-CM | POA: Diagnosis not present

## 2015-07-29 NOTE — Patient Instructions (Addendum)
Cheryl Abbott  07/29/2015     @PREFPERIOPPHARMACY @   Your procedure is scheduled on  08/02/2015   Report to [facility] at  1000  A.M.  Call this number if you have problems the morning of surgery:  [phone]   Remember:  Do not eat food or drink liquids after midnight.  Take these medicines the morning of surgery with A SIP OF WATER  Xanax, amlodipine, valium, neurontin, ditropan, protonix, paxil, ultram.   Do not wear jewelry, make-up or nail polish.  Do not wear lotions, powders, or perfumes.  You may wear deodorant.  Do not shave 48 hours prior to surgery.  Men may shave face and neck.  Do not bring valuables to the hospital.  [facility] is not responsible for any belongings or valuables.  Contacts, dentures or bridgework may not be worn into surgery.  Leave your suitcase in the car.  After surgery it may be brought to your room.  For patients admitted to the hospital, discharge time will be determined by your treatment team.  Patients discharged the day of surgery will not be allowed to drive home.   Name and phone number of your driver:   family Special instructions:  none  Please read over the following fact sheets that you were given. Coughing and Deep Breathing, Surgical Site Infection Prevention, Anesthesia Post-op Instructions and Care and Recovery After Surgery      Cystoscopy Cystoscopy is a procedure that is used to help your caregiver diagnose and sometimes treat conditions that affect your lower urinary tract. Your lower urinary tract includes your bladder and the tube through which urine passes from your bladder out of your body (urethra). Cystoscopy is performed with a thin, tube-shaped instrument (cystoscope). The cystoscope has lenses and a light at the end so that your caregiver can see inside your bladder. The cystoscope is inserted at the entrance of your urethra. Your caregiver guides it through your urethra and into your  bladder. There are two main types of cystoscopy:  Flexible cystoscopy (with a flexible cystoscope).  Rigid cystoscopy (with a rigid cystoscope). Cystoscopy may be recommended for many conditions, including:  Urinary tract infections.  Blood in your urine (hematuria).  Loss of bladder control (urinary incontinence) or overactive bladder.  Unusual cells found in a urine sample.  Urinary blockage.  Painful urination. Cystoscopy may also be done to remove a sample of your tissue to be checked under a microscope (biopsy). It may also be done to remove or destroy bladder stones. LET YOUR CAREGIVER KNOW ABOUT:  Allergies to food or medicine.  Medicines taken, including vitamins, herbs, eyedrops, over-the-counter medicines, and creams.  Use of steroids (by mouth or creams).  Previous problems with anesthetics or numbing medicines.  History of bleeding problems or blood clots.  Previous surgery.  Other health problems, including diabetes and kidney problems.  Possibility of pregnancy, if this applies. PROCEDURE The area around the opening to your urethra will be cleaned. A medicine to numb your urethra (local anesthetic) is used. If a tissue sample or stone is removed during the procedure, you may be given a medicine to make you sleep (general anesthetic). Your caregiver will gently insert the tip of the cystoscope into your urethra. The cystoscope will be slowly glided through your urethra and into your bladder. Sterile fluid will flow through the cystoscope and into your bladder. The fluid will expand and stretch your  bladder. This gives your caregiver a better view of your bladder walls. The procedure lasts about 15-20 minutes. AFTER THE PROCEDURE If a local anesthetic is used, you will be allowed to go home as soon as you are ready. If a general anesthetic is used, you will be taken to a recovery area until you are stable. You may have temporary bleeding and burning on urination.    This information is not intended to replace advice given to you by your health care provider. Make sure you discuss any questions you have with your health care provider.   Document Released: 05/25/2000 Document Revised: 06/18/2014 Document Reviewed: 11/19/2011 Elsevier Interactive Patient Education 2016 [place]. Cystoscopy, Care After Refer to this sheet in the next few weeks. These instructions provide you with information on caring for yourself after your procedure. Your caregiver may also give you more specific instructions. Your treatment has been planned according to current medical practices, but problems sometimes occur. Call your caregiver if you have any problems or questions after your procedure. HOME CARE INSTRUCTIONS  Things you can do to ease any discomfort after your procedure include:  Drinking enough water and fluids to keep your urine clear or pale yellow.  Taking a warm bath to relieve any burning feelings. SEEK IMMEDIATE MEDICAL CARE IF:   You have an increase in blood in your urine.  You notice blood clots in your urine.  You have difficulty passing urine.  You have the chills.  You have abdominal pain.  You have a fever or persistent symptoms for more than 2-3 days.  You have a fever and your symptoms suddenly get worse. MAKE SURE YOU:   Understand these instructions.  Will watch your condition.  Will get help right away if you are not doing well or get worse.   This information is not intended to replace advice given to you by your health care provider. Make sure you discuss any questions you have with your health care provider.   Document Released: 12/15/2004 Document Revised: 06/18/2014 Document Reviewed: 11/19/2011 Elsevier Interactive Patient Education 2016 [place]. Ureteral Stent Implantation Ureteral stent implantation is the implantation of a soft plastic tube with multiple holes into the tube that drains urine from your kidney to  your bladder (ureter). The stent helps drain your kidney when there is a blockage of the flow of urine in your ureter. The stent has a coil on each end to keep it from falling out. One end stays in the kidney. The other end stays in the bladder. It is most often taken out after any blockage has been removed or your ureter has healed. Short-term stents have a string attached to make removal quite easy. Removal of a short-term stent can be done in your health care provider's office or by you at home. Long-term stents need to be changed every few months. LET [facility] CARE PROVIDER KNOW ABOUT:  Any allergies you have.  All medicines you are taking, including vitamins, herbs, eye drops, creams, and over-the-counter medicines.  Previous problems you or members of your family have had with the use of anesthetics.  Any blood disorders you have.  Previous surgeries you have had.  Medical conditions you have. RISKS AND COMPLICATIONS Generally, ureteral stent implantation is a safe procedure. However, as with any procedure, complications can occur. Possible complications include:  Movement of the stent away from where it was originally placed (migration). This may affect the ability of the stent to properly drain your kidney.  If migration of the stent occurs, the stent may need to be replaced or repositioned.  Perforation of the ureter.  Infection. BEFORE THE PROCEDURE  You may be asked to wash your genital area with sterile soap the morning of your procedure.  You may be given an oral antibiotic which you should take with a sip of water as prescribed by your health care provider.  You may be asked to not eat or drink for 8 hours before the surgery. PROCEDURE  First you will be given an anesthetic so you do not feel pain during the procedure.  Your health care provider will insert a special lighted instrument called a cystoscope into your bladder. This allows your health care provider to  see the opening to your ureter.  A thin wire is carefully threaded into your bladder and up the ureter. The stent is inserted over the wire and the wire is then removed.  Your bladder will be emptied of urine. AFTER THE PROCEDURE You will be taken to a recovery room until it is okay for you to go home.   This information is not intended to replace advice given to you by your health care provider. Make sure you discuss any questions you have with your health care provider.   Document Released: 05/25/2000 Document Revised: 06/02/2013 Document Reviewed: 12/10/2014 Elsevier Interactive Patient Education 2016 [employer] Ureteral Stent Implantation, Care After Refer to this sheet in the next few weeks. These instructions provide you with information on caring for yourself after your procedure. Your health care provider may also give you more specific instructions. Your treatment has been planned according to current medical practices, but problems sometimes occur. Call your health care provider if you have any problems or questions after your procedure. WHAT TO EXPECT AFTER THE PROCEDURE You should be back to normal activity within 48 hours after the procedure. Nausea and vomiting may occur and are commonly the result of anesthesia. It is common to experience sharp pain in the back or lower abdomen and penis with voiding. This is caused by movement of the ends of the stent with the act of urinating.It usually goes away within minutes after you have stopped urinating. HOME CARE INSTRUCTIONS Make sure to drink plenty of fluids. You may have small amounts of bleeding, causing your urine to be red. This is normal. Certain movements may trigger pain or a feeling that you need to urinate. You may be given medicines to prevent infection or bladder spasms. Be sure to take all medicines as directed. Only take over-the-counter or prescription medicines for pain, discomfort, or fever as directed by your health  care provider. Do not take aspirin, as this can make bleeding worse. Your stent will be left in until the blockage is resolved. This may take 2 weeks or longer, depending on the reason for stent implantation. You may have an X-ray exam to make sure your ureter is open and that the stent has not moved out of position (migrated). The stent can be removed by your health care provider in the office. Medicines may be given for comfort while the stent is being removed. Be sure to keep all follow-up appointments so your health care provider can check that you are healing properly. SEEK MEDICAL CARE IF:  You experience increasing pain.  Your pain medicine is not working. SEEK IMMEDIATE MEDICAL CARE IF:  Your urine is dark red or has blood clots.  You are leaking urine (incontinent).  You have a fever, chills,  feeling sick to your stomach (nausea), or vomiting.  Your pain is not relieved by pain medicine.  The end of the stent comes out of the urethra.  You are unable to urinate.   This information is not intended to replace advice given to you by your health care provider. Make sure you discuss any questions you have with your health care provider.   Document Released: 01/28/2013 Document Revised: 06/02/2013 Document Reviewed: 12/10/2014 Elsevier Interactive Patient Education 2016 [employer]. PATIENT INSTRUCTIONS POST-ANESTHESIA  IMMEDIATELY FOLLOWING SURGERY:  Do not drive or operate machinery for the first twenty four hours after surgery.  Do not make any important decisions for twenty four hours after surgery or while taking narcotic pain medications or sedatives.  If you develop intractable nausea and vomiting or a severe headache please notify your doctor immediately.  FOLLOW-UP:  Please make an appointment with your surgeon as instructed. You do not need to follow up with anesthesia unless specifically instructed to do so.  WOUND CARE INSTRUCTIONS (if applicable):  Keep a dry clean  dressing on the anesthesia/puncture wound site if there is drainage.  Once the wound has quit draining you may leave it open to air.  Generally you should leave the bandage intact for twenty four hours unless there is drainage.  If the epidural site drains for more than 36-48 hours please call the anesthesia department.  QUESTIONS?:  Please feel free to call your physician or the hospital operator if you have any questions, and they will be happy to assist you.

## 2015-08-01 ENCOUNTER — Encounter (HOSPITAL_COMMUNITY): Payer: Self-pay

## 2015-08-01 ENCOUNTER — Encounter (HOSPITAL_COMMUNITY)
Admission: RE | Admit: 2015-08-01 | Discharge: 2015-08-01 | Disposition: A | Discharge status: Home / Self Care | Payer: Medicare Other | Source: Ambulatory Visit | Attending: Urology | Admitting: Urology

## 2015-08-02 ENCOUNTER — Ambulatory Visit (HOSPITAL_COMMUNITY): Payer: Medicare Other | Admitting: Anesthesiology

## 2015-08-02 ENCOUNTER — Encounter (HOSPITAL_COMMUNITY): Payer: Self-pay | Admitting: *Deleted

## 2015-08-02 ENCOUNTER — Telehealth: Payer: Self-pay | Admitting: Urology

## 2015-08-02 ENCOUNTER — Ambulatory Visit (HOSPITAL_COMMUNITY)
Admission: RE | Admit: 2015-08-02 | Discharge: 2015-08-02 | Disposition: A | Discharge status: Home / Self Care | Payer: Medicare Other | Source: Ambulatory Visit | Attending: Urology | Admitting: Urology

## 2015-08-02 ENCOUNTER — Encounter (HOSPITAL_COMMUNITY)
Admission: RE | Disposition: A | Discharge status: Home / Self Care | Payer: Self-pay | Source: Ambulatory Visit | Attending: Urology

## 2015-08-02 ENCOUNTER — Ambulatory Visit (HOSPITAL_COMMUNITY): Payer: Medicare Other

## 2015-08-02 DIAGNOSIS — F329 Major depressive disorder, single episode, unspecified: Secondary | ICD-10-CM | POA: Insufficient documentation

## 2015-08-02 DIAGNOSIS — E119 Type 2 diabetes mellitus without complications: Secondary | ICD-10-CM | POA: Insufficient documentation

## 2015-08-02 DIAGNOSIS — Z79899 Other long term (current) drug therapy: Secondary | ICD-10-CM | POA: Diagnosis not present

## 2015-08-02 DIAGNOSIS — F419 Anxiety disorder, unspecified: Secondary | ICD-10-CM | POA: Diagnosis not present

## 2015-08-02 DIAGNOSIS — G4733 Obstructive sleep apnea (adult) (pediatric): Secondary | ICD-10-CM | POA: Insufficient documentation

## 2015-08-02 DIAGNOSIS — Z87891 Personal history of nicotine dependence: Secondary | ICD-10-CM | POA: Diagnosis not present

## 2015-08-02 DIAGNOSIS — M1990 Unspecified osteoarthritis, unspecified site: Secondary | ICD-10-CM | POA: Insufficient documentation

## 2015-08-02 DIAGNOSIS — Z7984 Long term (current) use of oral hypoglycemic drugs: Secondary | ICD-10-CM | POA: Diagnosis not present

## 2015-08-02 DIAGNOSIS — N201 Calculus of ureter: Secondary | ICD-10-CM | POA: Diagnosis not present

## 2015-08-02 DIAGNOSIS — Z7982 Long term (current) use of aspirin: Secondary | ICD-10-CM | POA: Diagnosis not present

## 2015-08-02 DIAGNOSIS — K219 Gastro-esophageal reflux disease without esophagitis: Secondary | ICD-10-CM | POA: Insufficient documentation

## 2015-08-02 DIAGNOSIS — I10 Essential (primary) hypertension: Secondary | ICD-10-CM | POA: Insufficient documentation

## 2015-08-02 DIAGNOSIS — E785 Hyperlipidemia, unspecified: Secondary | ICD-10-CM | POA: Insufficient documentation

## 2015-08-02 DIAGNOSIS — N131 Hydronephrosis with ureteral stricture, not elsewhere classified: Secondary | ICD-10-CM | POA: Insufficient documentation

## 2015-08-02 HISTORY — PX: CYSTOSCOPY/RETROGRADE/URETEROSCOPY/STONE EXTRACTION WITH BASKET: SHX5317

## 2015-08-02 LAB — GLUCOSE, CAPILLARY
Glucose-Capillary: 100 mg/dL — ABNORMAL HIGH (ref 65–99)
Glucose-Capillary: 83 mg/dL (ref 65–99)

## 2015-08-02 SURGERY — CYSTOSCOPY, WITH CALCULUS REMOVAL USING BASKET
Anesthesia: General | Laterality: Bilateral

## 2015-08-02 MED ORDER — MIDAZOLAM HCL 2 MG/2ML IJ SOLN
1.0000 mg | INTRAMUSCULAR | Status: DC | PRN
  Administered 2015-08-02: 2 mg via INTRAVENOUS

## 2015-08-02 MED ORDER — LIDOCAINE HCL (PF) 1 % IJ SOLN
INTRAMUSCULAR | Status: AC
  Filled 2015-08-02: qty 5

## 2015-08-02 MED ORDER — GENTAMICIN SULFATE 40 MG/ML IJ SOLN: 200.0000 mg | Freq: Once | INTRAMUSCULAR | Status: DC

## 2015-08-02 MED ORDER — ONDANSETRON HCL 4 MG/2ML IJ SOLN
4.0000 mg | Freq: Once | INTRAMUSCULAR | Status: AC
  Administered 2015-08-02: 4 mg via INTRAVENOUS

## 2015-08-02 MED ORDER — ONDANSETRON HCL 4 MG/2ML IJ SOLN
INTRAMUSCULAR | Status: AC
Start: 2015-08-02 — End: 2015-08-02
  Filled 2015-08-02: qty 2

## 2015-08-02 MED ORDER — DOXYCYCLINE HYCLATE 100 MG PO TABS: 100.0000 mg | ORAL_TABLET | Freq: Two times a day (BID) | ORAL | Status: DC

## 2015-08-02 MED ORDER — IOHEXOL 350 MG/ML SOLN
INTRAVENOUS | Status: DC | PRN
  Administered 2015-08-02: 50 mL

## 2015-08-02 MED ORDER — FENTANYL CITRATE (PF) 100 MCG/2ML IJ SOLN: 25.0000 ug | INTRAMUSCULAR | Status: DC | PRN

## 2015-08-02 MED ORDER — MIDAZOLAM HCL 2 MG/2ML IJ SOLN
INTRAMUSCULAR | Status: AC
  Filled 2015-08-02: qty 2

## 2015-08-02 MED ORDER — PROPOFOL 10 MG/ML IV BOLUS
INTRAVENOUS | Status: DC | PRN
  Administered 2015-08-02: 140 mg via INTRAVENOUS

## 2015-08-02 MED ORDER — GENTAMICIN SULFATE 40 MG/ML IJ SOLN
200.0000 mg | Freq: Once | INTRAVENOUS | Status: AC
  Administered 2015-08-02: 200 mg via INTRAVENOUS
  Filled 2015-08-02: qty 5

## 2015-08-02 MED ORDER — SODIUM CHLORIDE 0.9 % IR SOLN
Status: DC | PRN
  Administered 2015-08-02 (×2): 3000 mL

## 2015-08-02 MED ORDER — FENTANYL CITRATE (PF) 100 MCG/2ML IJ SOLN
INTRAMUSCULAR | Status: DC | PRN
  Administered 2015-08-02: 50 ug via INTRAVENOUS

## 2015-08-02 MED ORDER — ONDANSETRON HCL 4 MG/2ML IJ SOLN: 4.0000 mg | Freq: Once | INTRAMUSCULAR | Status: DC | PRN

## 2015-08-02 MED ORDER — FENTANYL CITRATE (PF) 100 MCG/2ML IJ SOLN
INTRAMUSCULAR | Status: AC
  Filled 2015-08-02: qty 2

## 2015-08-02 MED ORDER — STERILE WATER FOR IRRIGATION IR SOLN
Status: DC | PRN
  Administered 2015-08-02: 1000 mL

## 2015-08-02 MED ORDER — FENTANYL CITRATE (PF) 100 MCG/2ML IJ SOLN
INTRAMUSCULAR | Status: AC
Start: 2015-08-02 — End: 2015-08-02
  Filled 2015-08-02: qty 2

## 2015-08-02 MED ORDER — LACTATED RINGERS IV SOLN
INTRAVENOUS | Status: DC
  Administered 2015-08-02: 12:00:00 via INTRAVENOUS

## 2015-08-02 MED ORDER — LIDOCAINE HCL (CARDIAC) 10 MG/ML IV SOLN
INTRAVENOUS | Status: DC | PRN
  Administered 2015-08-02: 50 mg via INTRAVENOUS

## 2015-08-02 MED ORDER — FENTANYL CITRATE (PF) 100 MCG/2ML IJ SOLN
25.0000 ug | INTRAMUSCULAR | Status: AC
  Administered 2015-08-02: 25 ug via INTRAVENOUS

## 2015-08-02 SURGICAL SUPPLY — 24 items
BAG DRAIN URO TABLE W/ADPT NS (DRAPE) ×2 IMPLANT
BAG DRN 8 ADPR NS SKTRN CSTL (DRAPE) ×1
BAG HAMPER (MISCELLANEOUS) ×2 IMPLANT
CATH INTERMIT  6FR 70CM (CATHETERS) ×2 IMPLANT
CLOTH BEACON ORANGE TIMEOUT ST (SAFETY) ×2 IMPLANT
EXTRACTOR STONE NITINOL NGAGE (UROLOGICAL SUPPLIES) ×2 IMPLANT
GLOVE BIOGEL M 8.0 STRL (GLOVE) ×2 IMPLANT
GLOVE BIOGEL PI IND STRL 7.0 (GLOVE) IMPLANT
GLOVE BIOGEL PI INDICATOR 7.0 (GLOVE) ×3
GOWN PREVENTION PLUS LG XLONG (DISPOSABLE) ×2 IMPLANT
GOWN STRL REUS W/ TWL LRG LVL3 (GOWN DISPOSABLE) ×1 IMPLANT
GOWN STRL REUS W/TWL LRG LVL3 (GOWN DISPOSABLE) ×2
GOWN STRL REUS W/TWL XL LVL3 (GOWN DISPOSABLE) ×2 IMPLANT
GUIDEWIRE STR DUAL SENSOR (WIRE) ×2 IMPLANT
IV NS IRRIG 3000ML ARTHROMATIC (IV SOLUTION) ×3 IMPLANT
KIT ROOM TURNOVER AP CYSTO (KITS) ×2 IMPLANT
LASER FIBER DISP (UROLOGICAL SUPPLIES) ×1 IMPLANT
MANIFOLD NEPTUNE II (INSTRUMENTS) ×2 IMPLANT
NS IRRIG 500ML POUR BTL (IV SOLUTION) IMPLANT
PACK CYSTO (CUSTOM PROCEDURE TRAY) ×2 IMPLANT
PAD ARMBOARD 7.5X6 YLW CONV (MISCELLANEOUS) ×2 IMPLANT
SHEATH ACCESS URETERAL 24CM (SHEATH) ×1 IMPLANT
SHEATH URET ACCESS 12FR/35CM (UROLOGICAL SUPPLIES) IMPLANT
TOWEL OR 17X26 4PK STRL BLUE (TOWEL DISPOSABLE) ×2 IMPLANT

## 2015-08-02 NOTE — Telephone Encounter (Signed)
Pt advised of pre op date/time and sx date on 07/29/15 @ 3:44pm. Sx: 08/02/15 @ 11:30-12:30pm, with Dr Diona Fanti @ [facility]. Pre op: 08/01/15 @ 2:15--[facility].   No prior authorization is required per Windle Guard with [facility].

## 2015-08-02 NOTE — Anesthesia Procedure Notes (Signed)
Procedure Name: LMA Insertion Date/Time: 08/02/2015 12:28 PM Performed by: Vista Deck Pre-anesthesia Checklist: Patient identified, Patient being monitored, Emergency Drugs available, Timeout performed and Suction available Patient Re-evaluated:Patient Re-evaluated prior to inductionOxygen Delivery Method: Circle System Utilized Preoxygenation: Pre-oxygenation with 100% oxygen Intubation Type: IV induction Ventilation: Mask ventilation without difficulty LMA: LMA inserted LMA Size: 4.0 Number of attempts: 1 Placement Confirmation: positive ETCO2 and breath sounds checked- equal and bilateral Tube secured with: Tape Dental Injury: Teeth and Oropharynx as per pre-operative assessment

## 2015-08-02 NOTE — H&P (Signed)
H&P  Chief Complaint: Kidney stones  History of Present Illness: Cheryl Abbott is a 70 y.o. year old female who presents for ureteroscopic management of bilateral ureteral calculi. She underwent ureteroscopic management of a large right renal stone burden on 06/28/15. She was admitted a few days later with stent pain--at that time she was noted to have a new left sided ureteral stone--approx 4 mm i left distal ureter. Her stent was subsequently removed, and she passed a few fragments, but now has a remaining 3x5 mm right distal ureteral stone as well as the previously mentioned left stone.   Past Medical History  Diagnosis Date  . Anxiety disorder   . Hyperlipidemia   . Hypertension   . Depression   . Chronic back pain   . Headache   . Type 2 diabetes mellitus ([place])   . GERD (gastroesophageal reflux disease)   . Anemia   . History of adenomatous polyp of colon     2008  . Fatty liver   . Sigmoid diverticulosis   . Left ureteral stone   . History of kidney stones   . Arthritis   . Hypercalcemia   . OSA treated with BiPAP     per study 2007  . Nephrolithiasis   . Sciatica   . Lumbar disc disease with radiculopathy   . Neuropathy, peripheral [facility])     Past Surgical History  Procedure Laterality Date  . Flexible sigmoidoscopy  09/11/2011    [id number] Internal hemorrhoids  . Esophagogastroduodenoscopy (egd) with propofol N/A 03/17/2013    Procedure: ESOPHAGOGASTRODUODENOSCOPY (EGD) WITH PROPOFOL;  Surgeon: Danie Binder, MD;  Location: [facility];  Service: Endoscopy;  Laterality: N/A;  . Savory dilation N/A 03/17/2013    Procedure: SAVORY DILATION;  Surgeon: Danie Binder, MD;  Location: [facility];  Service: Endoscopy;  Laterality: N/A;  #12.8, 14, 15, 16 dilators used  . Biopsy N/A 03/17/2013    Procedure: GASTRIC BIOPSIES;  Surgeon: Danie Binder, MD;  Location: [facility];  Service: Endoscopy;  Laterality: N/A;  . Polypectomy N/A 03/17/2013    Procedure: GASTRIC POLYPECTOMY;   Surgeon: Danie Binder, MD;  Location: [facility];  Service: Endoscopy;  Laterality: N/A;  . Colonoscopy  last one 10/02/2011    [facility] [place] TICS, IH, NEXT TCS APR 2018  . Cysto/   right ureterosocopy laser lithotripsy stone extraction  10-13-2004  . Cysto/  right retrograde pyelogram/  placment right ureteral stent  01-10-2010  . Percutaneous nephrostolithotomy Bilateral 12/  2011     [facility]  . Cardiovascular stress test  01-01-2014    normal lexiscan cardiolite/  no ischemia/ infarct/  normal LV function and wall motion ,  ef 81%  . Transthoracic echocardiogram  01-01-2014    mild LVH/  ef [id number]  grade I diastolic dysfunction/  trivial MR, TR, and PR  . Removal right thigh cyst  2006  . Vaginal hysterectomy  1970's  . Cystoscopy with retrograde pyelogram, ureteroscopy and stent placement Left 10/21/2014    Procedure: CYSTOSCOPY WITH RETROGRADE PYELOGRAM, URETEROSCOPY AND STENT PLACEMENT;  Surgeon: Franchot Gallo, MD;  Location: [facility];  Service: Urology;  Laterality: Left;  . Holmium laser application Left [id number]    Procedure: HOLMIUM LASER APPLICATION;  Surgeon: Franchot Gallo, MD;  Location: [facility];  Service: Urology;  Laterality: Left;  Marland Kitchen Mass excision Left 03/04/2015    Procedure: EXCISION OF SOFT TISSUE NEOPLASM LEFT ARM;  Surgeon: Aviva Signs, MD;  Location:  [facility];  Service: General;  Laterality: Left;  . Esophagogastroduodenoscopy (egd) with propofol N/A 06/10/2015    Procedure: ESOPHAGOGASTRODUODENOSCOPY (EGD) WITH PROPOFOL;  Surgeon: Danie Binder, MD;  [facility];  Service: Endoscopy;  Laterality: N/A;  1245 - pt knows to arrive at 8:30  . Savory dilation N/A 06/10/2015    Procedure: SAVORY DILATION;  Surgeon: Danie Binder, MD;  [facility];  Service: Endoscopy;  Laterality: N/A;  . Biopsy  06/10/2015    Procedure: BIOPSY;  Surgeon: Danie Binder, MD;  [facility];  Service: Endoscopy;;  gastric  biopsy  . Cardiac catheterization  05-11-2003  dr Shelva Majestic ([facility])    Abnormal cardiolite/   Normal coronary arteries and normal LVF,  ef  63%  . Cystoscopy/retrograde/ureteroscopy/stone extraction with basket Right 06/28/2015    Procedure: CYSTOSCOPY/RETROGRADE/URETEROSCOPY/STONE EXTRACTION WITH BASKET, RIGHT URETERAL DOUBLE J STENT PLACEMENT;  Surgeon: Franchot Gallo, MD;  Location: [facility];  Service: Urology;  Laterality: Right;  . Holmium laser application Right [id number]    Procedure: HOLMIUM LASER APPLICATION;  Surgeon: Franchot Gallo, MD;  Location: [facility];  Service: Urology;  Laterality: Right;    Home Medications:  No prescriptions prior to admission    Allergies:  Allergies  Allergen Reactions  . Ace Inhibitors Cough  . Keflex [Cephalexin] Diarrhea and Nausea And Vomiting  . Nitrofurantoin Diarrhea and Nausea And Vomiting  . Penicillins Hives, Rash and Other (See Comments)    Blisters on hands and feet Has patient had a PCN reaction causing immediate rash, facial/tongue/throat swelling, SOB or lightheadedness with hypotension: Yes Has patient had a PCN reaction causing severe rash involving mucus membranes or skin necrosis: No Has patient had a PCN reaction that required hospitalization No Has patient had a PCN reaction occurring within the last 10 years: No If all of the above answers are "NO", then may proceed with Cephalosporin use.     Family History  Problem Relation Age of Onset  . Hypertension Mother   . Diabetes Mother   . Heart failure Mother   . Dementia Mother   . Emphysema Father   . Hypertension Father   . Diabetes Brother   . GER disease Brother   . Cancer      family history   . Diabetes      family history   . Heart defect      famiily history   . Arthritis      family history   . Anesthesia problems Neg Hx   . Hypotension Neg Hx   . Malignant hyperthermia Neg Hx   . Pseudochol deficiency Neg Hx   . Colon cancer  Neg Hx   . Hypertension Sister   . Hypertension Sister     Social History:  reports that she quit smoking about 20 years ago. Her smoking use included Cigarettes. She has a 20 pack-year smoking history. She has never used smokeless tobacco. She reports that she does not drink alcohol or use illicit drugs.  ROS: A complete review of systems was performed.  All systems are negative except for pertinent findings as noted.  Physical Exam:  Vital signs in last 24 hours:   General:  Alert and oriented, No acute distress HEENT: Normocephalic, atraumatic Neck: No JVD or lymphadenopathy Cardiovascular: Regular rate and rhythm Lungs: Clear bilaterally Abdomen: Soft, nontender, nondistended, no abdominal masses Back: No CVA tenderness Extremities: No edema Neurologic: Grossly intact  Laboratory Data:  No results  found for this or any previous visit (from the past 24 hour(s)). No results found for this or any previous visit (from the past 240 hour(s)). Creatinine: No results for input(s): CREATININE in the last 168 hours.  Radiologic Imaging: No results found.  Impression/Assessment:  Bilateral ureteral stones  Plan:  Cystoscopy, bilateral retrograde ureteropyelograms, bilateral ureteroscopy and extraction of bilateral ureteral stones  Jorja Loa 08/02/2015, 7:45 AM  Lillette Boxer. Edom Schmuhl MD

## 2015-08-02 NOTE — Anesthesia Preprocedure Evaluation (Signed)
Anesthesia Evaluation  Patient identified by MRN, date of birth, ID band Patient awake    Reviewed: Allergy & Precautions, NPO status , Patient's Chart, lab work & pertinent test results  History of Anesthesia Complications Negative for: history of anesthetic complications  Airway Mallampati: III  TM Distance: >3 FB     Dental  (+) Teeth Intact   Pulmonary neg pulmonary ROS, sleep apnea , former smoker,    Pulmonary exam normal        Cardiovascular Exercise Tolerance: Poor hypertension, Pt. on medications Normal cardiovascular exam     Neuro/Psych  Headaches, PSYCHIATRIC DISORDERS Anxiety Depression  Neuromuscular disease negative neurological ROS     GI/Hepatic GERD  Medicated,  Endo/Other  diabetes, Well Controlled, Type 2  Renal/GU      Musculoskeletal negative musculoskeletal ROS (+)   Abdominal Normal abdominal exam  (+)   Peds  Hematology negative hematology ROS (+)   Anesthesia Other Findings   Reproductive/Obstetrics negative OB ROS                             Anesthesia Physical Anesthesia Plan  ASA: III  Anesthesia Plan: General   Post-op Pain Management:    Induction: Intravenous  Airway Management Planned: LMA  Additional Equipment:   Intra-op Plan:   Post-operative Plan: Extubation in OR  Informed Consent: I have reviewed the patients History and Physical, chart, labs and discussed the procedure including the risks, benefits and alternatives for the proposed anesthesia with the patient or authorized representative who has indicated his/her understanding and acceptance.     Plan Discussed with:   Anesthesia Plan Comments:         Anesthesia Quick Evaluation

## 2015-08-02 NOTE — Transfer of Care (Signed)
Immediate Anesthesia Transfer of Care Note  Patient: Cheryl Abbott  Procedure(s) Performed: Procedure(s): CYSTOSCOPY; BILATERAL RETROGRADE PYELOGRAMS; URETEROSCOPY/STONE EXTRACTION WITH BASKET (Bilateral) POSSIBLE BILATERAL STENT PLACEMENT (Bilateral) HOLMIUM LASER APPLICATION (Bilateral)  Patient Location: PACU  Anesthesia Type:General  Level of Consciousness: sedated and patient cooperative  Airway & Oxygen Therapy: non-rebreather face mask  Post-op Assessment: Report given to RN, Post -op Vital signs reviewed and stable and Patient moving all extremities  Post vital signs: Reviewed and stable  Complications: No apparent anesthesia complications

## 2015-08-02 NOTE — Discharge Instructions (Signed)
[facility] [phone] Post Ureteroscopy With or Without Stent Instructions  Definitions:  Ureter: The duct that transports urine from the kidney to the bladder. GENERAL INSTRUCTIONS:  Despite the fact that no skin incisions were used, the area around the ureter and bladder is raw and irritated.  Sometimes the urge is strong enough that you may not be able to stop yourself from urinating. The only real cure is to remove the stent and then give time for the bladder wall to heal which can't be done until the danger of the ureter swelling shut has passed, which varies.  You may see some blood in your urine a few days afterwards. Do not be alarmed, even if the urine was clear for a while. Get off your feet and drink lots of fluids until clearing occurs. If you start to pass clots or don't improve, call us.  DIET: You may return to your normal diet immediately. Because of the raw surface of your bladder, alcohol, spicy foods, acid type foods and drinks with caffeine may cause irritation or frequency and should be used in moderation. To keep your urine flowing freely and to avoid constipation, drink plenty of fluids during the day ( 8-10 glasses ). Tip: Avoid cranberry juice because it is very acidic.  ACTIVITY: Your physical activity doesn't need to be restricted. However, if you are very active, you may see some blood in your urine. We suggest that you reduce your activity under these circumstances until the bleeding has stopped.  BOWELS: It is important to keep your bowels regular during the postoperative period. Straining with bowel movements can cause bleeding. A bowel movement every other day is reasonable. Use a mild laxative if needed, such as Milk of Magnesia 2-3 tablespoons, or 2 Dulcolax tablets. Call if you continue to have problems. If you have been taking narcotics for pain, before, during or after your surgery, you may be constipated. Take a laxative if  necessary.   MEDICATION: You should resume your pre-surgery medications unless told not to. In addition you will often be given an antibiotic to prevent infection. These should be taken as prescribed until the bottles are finished unless you are having an unusual reaction to one of the drugs.  PROBLEMS YOU SHOULD REPORT TO [place]:  Fevers over 100.5 Fahrenheit.  Heavy bleeding, or clots ( See above notes about blood in urine ).  Inability to urinate.  Drug reactions ( hives, rash, nausea, vomiting, diarrhea ).  Severe burning or pain with urination that is not improving.  FOLLOW-UP: You will need a follow-up appointment to monitor your progress. Call for this appointment at the number listed above. Usually the first appointment will be about three to fourteen days after your surgery.

## 2015-08-02 NOTE — Anesthesia Postprocedure Evaluation (Signed)
Anesthesia Post Note  Patient: Cheryl Abbott  Procedure(s) Performed: Procedure(s) (LRB): CYSTOSCOPY; BILATERAL RETROGRADE PYELOGRAMS; BILATERAL URETEROSCOPY, STONE EXTRACTION WITH BASKET (Bilateral)  Patient location during evaluation: PACU Anesthesia Type: General Level of consciousness: awake Pain management: pain level controlled Vital Signs Assessment: post-procedure vital signs reviewed and stable Respiratory status: spontaneous breathing and patient connected to nasal cannula oxygen Cardiovascular status: unstable Anesthetic complications: no    st Pain: There were no vitals filed for this visit.               Drucie Opitz

## 2015-08-03 ENCOUNTER — Encounter (HOSPITAL_COMMUNITY): Payer: Self-pay | Admitting: Urology

## 2015-08-03 ENCOUNTER — Ambulatory Visit: Payer: Medicare Other | Admitting: Family Medicine

## 2015-08-03 NOTE — Op Note (Signed)
PATIENT:  [name]  PRE-OPERATIVE DIAGNOSIS: bilateral ureteral calculi with right sided hydronephrosis  POST-OPERATIVE DIAGNOSIS: Same  PROCEDURE: cystoscopy, bilateral retrograde ureteral pyelograms with intraoperative fluoroscopy, bilateral ureteroscopy, bilateral ureteral stone extraction  SURGEON:  Lillette Boxer. Aspasia Rude, M.D.  ANESTHESIA:  General  EBL:  Minimal  DRAINS: None  LOCAL MEDICATIONS USED:  None  SPECIMEN:  Stone fragments, to the patient's family  INDICATION: NISHITA VETH is a 70 year old female with urolithiasis.  She had right-sided ureterorenoscopy in late January, with fragmentation and extraction of a significant stone burden.  She had a stent in postoperatively.  The stent was removed, and she still has small stones in her right distal ureter with significant hydronephrosis.  She developed a new left ureteral stone in the interim.  She presents at this time for cystoscopy, retrograde ureteral pyelograms and stone extractions bilaterally.  Description of procedure: The patient was properly identified and marked (if applicable) in the holding area. They were then  taken to the operating room and placed on the table in a supine position. General anesthesia was then administered. Once fully anesthetized the patient was moved to the dorsolithotomy position and the genitalia and perineum were sterilely prepped and draped in standard fashion. An official timeout was then performed.  A 23 French panendoscope was advanced into her bladder which was inspected circumferentially and found to be normal.  Ureteral orifices were normal in configuration and location.  Using a 6 [place] open-ended catheter, bilateral retrograde ureteral pyelograms were performed.  On the right, there was a distal ureteral stricture approximately 3 cm up with proximal hydroureteronephrosis and a filling defect consistent with the stone, quite small.  No other filling defects were noted on  the right side.  On the left, there was no significant hydronephrosis or stricture, but a filling defect was present and proximally 4-5 cm up consistent with the previously mentioned left ureteral stone.  On the right, 0.038 inch sensor tip guidewire was advanced proximally into the upper pole calyceal system.  The distal ureter and the stricture were dilated with first inner core and then the entire 12/14 ureteral access catheter.  This was left in approximately 1-2 minutes to provide adequate dilation of the stricture.  Following this, the access sheath was removed.  A 6 French semirigid ureteroscope was advanced into the ureter, easily up to the stone which was engaged and removed with the [employer].  No other stone material was seen along the course of the entire ureter following extraction of the stone and reintroduction of the scope.  The scope was then removed, and again the ureter was dilated with the entire 12/14 ureteral access sheath.  This was again left in 2 minutes to provide adequate dilation.  A stent was not left in.  The sensor-tip guidewire was then placed into the left renal pelvis fluoroscopically, and the leftdistal ureter was dilatedwith first the inner core, then the entire 12/14 ureteral access catheter.  Access catheter was then removed, and the ureteroscope placed under direct vision.  The stone was identified, grasped with the basket, and easily extracted.  There was no significant ureteral trauma.  The scope was then readmitted, and the entire ureter was identified/inspected with no further stone material seen.  The scope was then withdrawn.  The guidewire was removed.  The bladder was then drained.  The patient was then awakened and taken to the PACU in stable condition, having tolerated the procedure well.    PLAN OF CARE: Discharge  to home after PACU  PATIENT DISPOSITION:  PACU - hemodynamically stable.

## 2015-08-04 ENCOUNTER — Other Ambulatory Visit: Payer: Self-pay

## 2015-08-04 ENCOUNTER — Telehealth: Payer: Self-pay | Admitting: Gastroenterology

## 2015-08-04 ENCOUNTER — Encounter (HOSPITAL_COMMUNITY): Payer: Self-pay | Admitting: *Deleted

## 2015-08-04 NOTE — Telephone Encounter (Signed)
Christina from Short Stay called to say that patient is on for Tuesday with [facility] for TCS and they need the orders put in.

## 2015-08-04 NOTE — Telephone Encounter (Signed)
Orders entered

## 2015-08-05 ENCOUNTER — Encounter (HOSPITAL_COMMUNITY): Admission: RE | Admit: 2015-08-05 | Payer: Medicare Other | Source: Ambulatory Visit

## 2015-08-05 ENCOUNTER — Other Ambulatory Visit (HOSPITAL_COMMUNITY): Payer: Medicare Other

## 2015-08-06 ENCOUNTER — Encounter (HOSPITAL_COMMUNITY): Payer: Self-pay | Admitting: *Deleted

## 2015-08-06 ENCOUNTER — Emergency Department (HOSPITAL_COMMUNITY): Payer: Medicare Other

## 2015-08-06 ENCOUNTER — Emergency Department (HOSPITAL_COMMUNITY)
Admission: EM | Admit: 2015-08-06 | Discharge: 2015-08-06 | Disposition: A | Discharge status: Home / Self Care | Payer: Medicare Other | Attending: Emergency Medicine | Admitting: Emergency Medicine

## 2015-08-06 DIAGNOSIS — D72829 Elevated white blood cell count, unspecified: Secondary | ICD-10-CM | POA: Diagnosis not present

## 2015-08-06 DIAGNOSIS — M25552 Pain in left hip: Secondary | ICD-10-CM | POA: Insufficient documentation

## 2015-08-06 DIAGNOSIS — F329 Major depressive disorder, single episode, unspecified: Secondary | ICD-10-CM | POA: Diagnosis not present

## 2015-08-06 DIAGNOSIS — Y999 Unspecified external cause status: Secondary | ICD-10-CM | POA: Insufficient documentation

## 2015-08-06 DIAGNOSIS — Z7982 Long term (current) use of aspirin: Secondary | ICD-10-CM | POA: Diagnosis not present

## 2015-08-06 DIAGNOSIS — R531 Weakness: Secondary | ICD-10-CM

## 2015-08-06 DIAGNOSIS — N390 Urinary tract infection, site not specified: Secondary | ICD-10-CM | POA: Diagnosis not present

## 2015-08-06 DIAGNOSIS — E1142 Type 2 diabetes mellitus with diabetic polyneuropathy: Secondary | ICD-10-CM | POA: Insufficient documentation

## 2015-08-06 DIAGNOSIS — J069 Acute upper respiratory infection, unspecified: Secondary | ICD-10-CM | POA: Diagnosis not present

## 2015-08-06 DIAGNOSIS — Z87442 Personal history of urinary calculi: Secondary | ICD-10-CM | POA: Diagnosis not present

## 2015-08-06 DIAGNOSIS — E785 Hyperlipidemia, unspecified: Secondary | ICD-10-CM | POA: Diagnosis not present

## 2015-08-06 DIAGNOSIS — Y92009 Unspecified place in unspecified non-institutional (private) residence as the place of occurrence of the external cause: Secondary | ICD-10-CM | POA: Diagnosis not present

## 2015-08-06 DIAGNOSIS — M25551 Pain in right hip: Secondary | ICD-10-CM | POA: Insufficient documentation

## 2015-08-06 DIAGNOSIS — G8929 Other chronic pain: Secondary | ICD-10-CM | POA: Diagnosis not present

## 2015-08-06 DIAGNOSIS — M542 Cervicalgia: Secondary | ICD-10-CM | POA: Insufficient documentation

## 2015-08-06 DIAGNOSIS — F17210 Nicotine dependence, cigarettes, uncomplicated: Secondary | ICD-10-CM | POA: Diagnosis not present

## 2015-08-06 DIAGNOSIS — Y9389 Activity, other specified: Secondary | ICD-10-CM | POA: Insufficient documentation

## 2015-08-06 DIAGNOSIS — Z86010 Personal history of colonic polyps: Secondary | ICD-10-CM | POA: Diagnosis not present

## 2015-08-06 DIAGNOSIS — S0990XA Unspecified injury of head, initial encounter: Secondary | ICD-10-CM | POA: Diagnosis present

## 2015-08-06 DIAGNOSIS — Z8719 Personal history of other diseases of the digestive system: Secondary | ICD-10-CM | POA: Diagnosis not present

## 2015-08-06 DIAGNOSIS — I10 Essential (primary) hypertension: Secondary | ICD-10-CM | POA: Insufficient documentation

## 2015-08-06 DIAGNOSIS — M549 Dorsalgia, unspecified: Secondary | ICD-10-CM | POA: Insufficient documentation

## 2015-08-06 DIAGNOSIS — M1990 Unspecified osteoarthritis, unspecified site: Secondary | ICD-10-CM | POA: Insufficient documentation

## 2015-08-06 DIAGNOSIS — Z862 Personal history of diseases of the blood and blood-forming organs and certain disorders involving the immune mechanism: Secondary | ICD-10-CM | POA: Insufficient documentation

## 2015-08-06 DIAGNOSIS — R079 Chest pain, unspecified: Secondary | ICD-10-CM | POA: Diagnosis not present

## 2015-08-06 DIAGNOSIS — W1839XA Other fall on same level, initial encounter: Secondary | ICD-10-CM | POA: Insufficient documentation

## 2015-08-06 DIAGNOSIS — W19XXXA Unspecified fall, initial encounter: Secondary | ICD-10-CM

## 2015-08-06 LAB — URINALYSIS, ROUTINE W REFLEX MICROSCOPIC
Bilirubin Urine: NEGATIVE
Glucose, UA: NEGATIVE mg/dL
Ketones, ur: NEGATIVE mg/dL
Nitrite: NEGATIVE
Protein, ur: 30 mg/dL — AB
Specific Gravity, Urine: 1.015 (ref 1.005–1.030)
pH: 6 (ref 5.0–8.0)

## 2015-08-06 LAB — CBC WITH DIFFERENTIAL/PLATELET
Basophils Absolute: 0 10*3/uL (ref 0.0–0.1)
Basophils Relative: 0 %
Eosinophils Absolute: 0.2 10*3/uL (ref 0.0–0.7)
Eosinophils Relative: 1 %
HCT: 30.4 % — ABNORMAL LOW (ref 36.0–46.0)
Hemoglobin: 9.2 g/dL — ABNORMAL LOW (ref 12.0–15.0)
Lymphocytes Relative: 8 %
Lymphs Abs: 1.6 10*3/uL (ref 0.7–4.0)
MCH: 22.6 pg — ABNORMAL LOW (ref 26.0–34.0)
MCHC: 30.3 g/dL (ref 30.0–36.0)
MCV: 74.7 fL — ABNORMAL LOW (ref 78.0–100.0)
Monocytes Absolute: 2 10*3/uL — ABNORMAL HIGH (ref 0.1–1.0)
Monocytes Relative: 11 %
Neutro Abs: 15.1 10*3/uL — ABNORMAL HIGH (ref 1.7–7.7)
Neutrophils Relative %: 80 %
Platelets: 495 10*3/uL — ABNORMAL HIGH (ref 150–400)
RBC: 4.07 MIL/uL (ref 3.87–5.11)
RDW: 18.2 % — ABNORMAL HIGH (ref 11.5–15.5)
WBC: 18.9 10*3/uL — ABNORMAL HIGH (ref 4.0–10.5)

## 2015-08-06 LAB — BASIC METABOLIC PANEL
Anion gap: 9 (ref 5–15)
BUN: 20 mg/dL (ref 6–20)
CO2: 25 mmol/L (ref 22–32)
Calcium: 10 mg/dL (ref 8.9–10.3)
Chloride: 102 mmol/L (ref 101–111)
Creatinine, Ser: 1.91 mg/dL — ABNORMAL HIGH (ref 0.44–1.00)
GFR calc Af Amer: 29 mL/min — ABNORMAL LOW (ref >60)
GFR calc non Af Amer: 25 mL/min — ABNORMAL LOW (ref >60)
Glucose, Bld: 103 mg/dL — ABNORMAL HIGH (ref 65–99)
Potassium: 4.6 mmol/L (ref 3.5–5.1)
Sodium: 136 mmol/L (ref 135–145)

## 2015-08-06 LAB — CK: Total CK: 47 U/L (ref 38–234)

## 2015-08-06 LAB — URINE MICROSCOPIC-ADD ON

## 2015-08-06 MED ORDER — HYDROCODONE-ACETAMINOPHEN 5-325 MG PO TABS: 1.0000 | ORAL_TABLET | Freq: Four times a day (QID) | ORAL | Status: DC | PRN

## 2015-08-06 MED ORDER — CIPROFLOXACIN HCL 500 MG PO TABS: 500.0000 mg | ORAL_TABLET | Freq: Two times a day (BID) | ORAL | Status: DC

## 2015-08-06 MED ORDER — ACETAMINOPHEN 325 MG PO TABS
650.0000 mg | ORAL_TABLET | Freq: Once | ORAL | Status: AC
  Administered 2015-08-06: 650 mg via ORAL
  Filled 2015-08-06: qty 2

## 2015-08-06 MED ORDER — SODIUM CHLORIDE 0.9 % IV BOLUS (SEPSIS)
500.0000 mL | Freq: Once | INTRAVENOUS | Status: AC
Start: 2015-08-06 — End: 2015-08-06
  Administered 2015-08-06: 500 mL via INTRAVENOUS

## 2015-08-06 MED ORDER — CIPROFLOXACIN IN D5W 400 MG/200ML IV SOLN
400.0000 mg | Freq: Once | INTRAVENOUS | Status: AC
  Administered 2015-08-06: 400 mg via INTRAVENOUS
  Filled 2015-08-06: qty 200

## 2015-08-06 MED ORDER — FENTANYL CITRATE (PF) 100 MCG/2ML IJ SOLN
50.0000 ug | Freq: Once | INTRAMUSCULAR | Status: DC
  Filled 2015-08-06: qty 2

## 2015-08-06 NOTE — ED Provider Notes (Signed)
CSN: [id number]     Arrival date & time 08/06/15  1036 History  By signing my name below, I, Cheryl Abbott, attest that this documentation has been prepared under the direction and in the presence of Carmin Muskrat, MD. Electronically Signed: [name], ED Scribe. 08/06/2015. 12:53 PM  Chief Complaint  Patient presents with  . Fall    The history is provided by the patient. No language interpreter was used.   Cheryl Abbott is a 70 y.o. female who presents to the Emergency Department complaining of a fall at home yesterday. Pt states that she lost balance and fell backwards to a carpeted floor. She struck her head but did not lose consciousness. Associated symptoms include headache, left-sided neck pain, bilateral hip pain (L>R),  lower back pain, abdominal pain, and vomiting. Pt denies chest pain, weakness, confusion, and fever.   Past Medical History  Diagnosis Date  . Anxiety disorder   . Hyperlipidemia   . Hypertension   . Depression   . Chronic back pain   . Headache   . Type 2 diabetes mellitus ([place])   . GERD (gastroesophageal reflux disease)   . Anemia   . History of adenomatous polyp of colon     2008  . Fatty liver   . Sigmoid diverticulosis   . Left ureteral stone   . History of kidney stones   . Arthritis   . Hypercalcemia   . OSA treated with BiPAP     per study 2007  . Nephrolithiasis   . Sciatica   . Lumbar disc disease with radiculopathy   . Neuropathy, peripheral [facility])    Past Surgical History  Procedure Laterality Date  . Flexible sigmoidoscopy  09/11/2011    [id number] Internal hemorrhoids  . Esophagogastroduodenoscopy (egd) with propofol N/A 03/17/2013    Procedure: ESOPHAGOGASTRODUODENOSCOPY (EGD) WITH PROPOFOL;  Surgeon: Danie Binder, MD;  Location: [facility];  Service: Endoscopy;  Laterality: N/A;  . Savory dilation N/A 03/17/2013    Procedure: SAVORY DILATION;  Surgeon: Danie Binder, MD;  Location: [facility];  Service: Endoscopy;   Laterality: N/A;  #12.8, 14, 15, 16 dilators used  . Biopsy N/A 03/17/2013    Procedure: GASTRIC BIOPSIES;  Surgeon: Danie Binder, MD;  Location: [facility];  Service: Endoscopy;  Laterality: N/A;  . Polypectomy N/A 03/17/2013    Procedure: GASTRIC POLYPECTOMY;  Surgeon: Danie Binder, MD;  Location: [facility];  Service: Endoscopy;  Laterality: N/A;  . Colonoscopy  last one 10/02/2011    MOD  TICS, IH, NEXT TCS APR 2018  . Cysto/   right ureterosocopy laser lithotripsy stone extraction  10-13-2004  . Cysto/  right retrograde pyelogram/  placment right ureteral stent  01-10-2010  . Percutaneous nephrostolithotomy Bilateral 12/  2011     [facility]  . Cardiovascular stress test  01-01-2014    normal lexiscan cardiolite/  no ischemia/ infarct/  normal LV function and wall motion ,  ef 81%  . Transthoracic echocardiogram  01-01-2014    mild LVH/  ef [id number]  grade I diastolic dysfunction/  trivial MR, TR, and PR  . Removal right thigh cyst  2006  . Vaginal hysterectomy  1970's  . Cystoscopy with retrograde pyelogram, ureteroscopy and stent placement Left 10/21/2014    Procedure: CYSTOSCOPY WITH RETROGRADE PYELOGRAM, URETEROSCOPY AND STENT PLACEMENT;  Surgeon: Franchot Gallo, MD;  Location: [facility];  Service: Urology;  Laterality: Left;  . Holmium laser application Left [id number]    Procedure:  HOLMIUM LASER APPLICATION;  Surgeon: Franchot Gallo, MD;  Location: [facility];  Service: Urology;  Laterality: Left;  Marland Kitchen Mass excision Left 03/04/2015    Procedure: EXCISION OF SOFT TISSUE NEOPLASM LEFT ARM;  Surgeon: Aviva Signs, MD;  Location: [facility];  Service: General;  Laterality: Left;  . Esophagogastroduodenoscopy (egd) with propofol N/A 06/10/2015    Procedure: ESOPHAGOGASTRODUODENOSCOPY (EGD) WITH PROPOFOL;  Surgeon: Danie Binder, MD;  [facility];  Service: Endoscopy;  Laterality: N/A;  1245 - pt knows to arrive at 8:30  . Savory dilation N/A 06/10/2015     Procedure: SAVORY DILATION;  Surgeon: Danie Binder, MD;  [facility];  Service: Endoscopy;  Laterality: N/A;  . Biopsy  06/10/2015    Procedure: BIOPSY;  Surgeon: Danie Binder, MD;  [facility];  Service: Endoscopy;;  gastric biopsy  . Cardiac catheterization  05-11-2003  dr Shelva Majestic ([facility])    Abnormal cardiolite/   Normal coronary arteries and normal LVF,  ef  63%  . Cystoscopy/retrograde/ureteroscopy/stone extraction with basket Right 06/28/2015    Procedure: CYSTOSCOPY/RETROGRADE/URETEROSCOPY/STONE EXTRACTION WITH BASKET, RIGHT URETERAL DOUBLE J STENT PLACEMENT;  Surgeon: Franchot Gallo, MD;  Location: [facility];  Service: Urology;  Laterality: Right;  . Holmium laser application Right [id number]    Procedure: HOLMIUM LASER APPLICATION;  Surgeon: Franchot Gallo, MD;  Location: [facility];  Service: Urology;  Laterality: Right;  . Cystoscopy/retrograde/ureteroscopy/stone extraction with basket Bilateral 08/02/2015    Procedure: CYSTOSCOPY; BILATERAL RETROGRADE PYELOGRAMS; BILATERAL URETEROSCOPY, STONE EXTRACTION WITH BASKET;  Surgeon: Franchot Gallo, MD;  Location: [facility];  Service: Urology;  Laterality: Bilateral;   Family History  Problem Relation Age of Onset  . Hypertension Mother   . Diabetes Mother   . Heart failure Mother   . Dementia Mother   . Emphysema Father   . Hypertension Father   . Diabetes Brother   . GER disease Brother   . Cancer      family history   . Diabetes      family history   . Heart defect      famiily history   . Arthritis      family history   . Anesthesia problems Neg Hx   . Hypotension Neg Hx   . Malignant hyperthermia Neg Hx   . Pseudochol deficiency Neg Hx   . Colon cancer Neg Hx   . Hypertension Sister   . Hypertension Sister    Social History  Substance Use Topics  . Smoking status: Former Smoker -- 1.00 packs/day for 20 years    Types: Cigarettes    Quit date: 09/21/1994  . Smokeless  tobacco: Never Used  . Alcohol Use: No   OB History    Gravida Para Term Preterm AB TAB SAB Ectopic Multiple Living   2 2 2       2      Review of Systems  Constitutional:       Per HPI, otherwise negative  HENT:       Per HPI, otherwise negative  Respiratory:       Per HPI, otherwise negative  Cardiovascular: Negative for chest pain.       Per HPI, otherwise negative  Gastrointestinal: Positive for nausea, vomiting and abdominal pain.  Endocrine:       Negative aside from HPI  Genitourinary:       Neg aside from HPI   Musculoskeletal: Positive for back pain, arthralgias and neck pain.  Skin: Negative.   Neurological: Positive for headaches. Negative for syncope and weakness.   Allergies  Ace inhibitors; Keflex; Nitrofurantoin; and Penicillins  Home Medications   Prior to Admission medications   Medication Sig Start Date End Date Taking? Authorizing Provider  ALPRAZolam Duanne Moron) 0.5 MG tablet TAKE 1 TABLET BY MOUTH TWICE A DAY 05/27/15  Yes [name], MD  amLODipine (NORVASC) 10 MG tablet Take 1 tablet (10 mg total) by mouth daily. Patient taking differently: Take 10 mg by mouth every morning.  09/09/14  Yes [name], MD  aspirin EC 81 MG tablet Take 81 mg by mouth daily.   Yes Historical Provider, MD  diazepam (VALIUM) 5 MG tablet Take 1 tablet (5 mg total) by mouth every 8 (eight) hours as needed for muscle spasms. 07/14/15  Yes Donne Hazel, MD  diphenhydramine-acetaminophen (TYLENOL PM) 25-500 MG TABS Take 1-2 tablets by mouth at bedtime as needed (for sleep/pain).    Yes Historical Provider, MD  diphenoxylate-atropine (LOMOTIL) 2.5-0.025 MG per tablet Take 1 tablet by mouth 4 (four) times daily as needed for diarrhea or loose stools. 03/01/15  Yes Tanna Furry, MD  docusate sodium (COLACE) 100 MG capsule Take 200 mg by mouth at bedtime.    Yes Historical Provider, MD  doxycycline (VIBRA-TABS) 100 MG tablet Take 1 tablet (100 mg total) by mouth 2 (two) times  daily. 08/02/15  Yes Franchot Gallo, MD  gabapentin (NEURONTIN) 100 MG capsule TAKE 1 CAPSULE (100 MG TOTAL) BY MOUTH AT BEDTIME. 07/11/15  Yes [name], MD  lovastatin (MEVACOR) 40 MG tablet TAKE 2 TABLETS (80 MG TOTAL) BY MOUTH AT BEDTIME. 06/01/15  Yes [name], MD  metFORMIN (GLUCOPHAGE) 500 MG tablet TAKE 2 TABLETS (1,000 MG TOTAL) BY MOUTH 2 (TWO) TIMES DAILY WITH A MEAL. 09/13/14  Yes [name], MD  oxybutynin (DITROPAN) 5 MG tablet Take 1 tablet (5 mg total) by mouth every 8 (eight) hours as needed for bladder spasms. 06/28/15  Yes Franchot Gallo, MD  pantoprazole (PROTONIX) 40 MG tablet TAKE 1 TABLET BY MOUTH TWICE A DAY 03/16/15  Yes Carlis Stable, NP  PARoxetine (PAXIL) 40 MG tablet TAKE 1 TABLET (40 MG TOTAL) BY MOUTH EVERY MORNING. 07/05/15  Yes [name], MD  potassium citrate (UROCIT-K) 10 MEQ (1080 MG) SR tablet TAKE 1 TABLET 3 TIMES DAILY WITH MEALS. Patient taking differently: Take 10 mEq by mouth 2 (two) times daily.  07/27/14  Yes [name], MD  traMADol (ULTRAM) 50 MG tablet Take 1 tablet (50 mg total) by mouth every 6 (six) hours as needed. Patient taking differently: Take 50 mg by mouth every 6 (six) hours as needed for moderate pain.  07/21/15  Yes Daleen Bo, MD  Vitamin D, Ergocalciferol, (DRISDOL) 50000 UNITS CAPS capsule TAKE 1 CAPSULE (50,000 UNITS TOTAL) BY MOUTH EVERY 7 (SEVEN) DAYS. Patient taking differently: Take 50,000 Units by mouth every Sunday. TAKE 1 CAPSULE (50,000 UNITS TOTAL) BY MOUTH EVERY 7 (SEVEN) DAYS. 08/30/14  Yes [name], MD  ciprofloxacin (CIPRO) 500 MG tablet Take 1 tablet by mouth daily. Reported on 08/06/2015 07/26/15   Historical Provider, MD   BP 129/67 mmHg  Pulse 114  Temp(Src) 98.6 F (37 C) (Oral)  Resp 18  Ht 5' 6.5" (1.689 m)  Wt 191 lb (86.637 kg)  BMI 30.37 kg/m2  SpO2 98% Physical Exam  Constitutional: She is oriented to person, place, and time. She appears well-developed and  well-nourished.  No distress.  HENT:  Head: Normocephalic and atraumatic.  Eyes: Conjunctivae and EOM are normal.  Cardiovascular: Normal rate and regular rhythm.   Pulmonary/Chest: Effort normal and breath sounds normal. No stridor. No respiratory distress.  Abdominal: She exhibits no distension.  Musculoskeletal: She exhibits no edema.  Pelvis is stable No hip deformity  Neurological: She is alert and oriented to person, place, and time. No cranial nerve deficit.  Skin: Skin is warm and dry.  Psychiatric: She has a normal mood and affect.  Nursing note and vitals reviewed.   ED Course  Procedures (including critical care time) COORDINATION OF CARE: 12:20 PM Discussed treatment plan which includes lab work, XR of the lumbar spine, XR of the hips and pelvis, fentanyl, and IVF  with pt at bedside and pt agreed to plan.  Labs Review Labs Reviewed  CBC WITH DIFFERENTIAL/PLATELET - Abnormal; Notable for the following:    WBC 18.9 (*)    Hemoglobin 9.2 (*)    HCT 30.4 (*)    MCV 74.7 (*)    MCH 22.6 (*)    RDW 18.2 (*)    Platelets 495 (*)    Neutro Abs 15.1 (*)    Monocytes Absolute 2.0 (*)    All other components within normal limits  BASIC METABOLIC PANEL - Abnormal; Notable for the following:    Glucose, Bld 103 (*)    Creatinine, Ser 1.91 (*)    GFR calc non Af Amer 25 (*)    GFR calc Af Amer 29 (*)    All other components within normal limits  URINALYSIS, ROUTINE W REFLEX MICROSCOPIC (NOT AT [facility]) - Abnormal; Notable for the following:    Hgb urine dipstick LARGE (*)    Protein, ur 30 (*)    Leukocytes, UA SMALL (*)    All other components within normal limits  URINE MICROSCOPIC-ADD ON - Abnormal; Notable for the following:    Squamous Epithelial / LPF 0-5 (*)    Bacteria, UA FEW (*)    All other components within normal limits  CK    Imaging Review Dg Chest 1 View  08/06/2015  CLINICAL DATA:  Chest pain and weakness.  Leukocytosis. EXAM: CHEST 1 VIEW COMPARISON:   November 26, 2013 FINDINGS: The heart, hila, mediastinum, lungs, and pleura are normal. IMPRESSION: No active disease. Electronically Signed   By: Dorise Bullion III M.D   On: 08/06/2015 14:46   Dg Lumbar Spine Complete  08/06/2015  CLINICAL DATA:  70 year old female with acute back pain following falls over the last 2 days. Initial encounter. EXAM: LUMBAR SPINE - COMPLETE 4+ VIEW COMPARISON:  07/26/2015 abdominal CT and prior exams. FINDINGS: No evidence of fracture or subluxation. Mild multilevel degenerative disc disease and spondylosis noted. Facet arthropathy in the lumbar spine again identified. No focal bony lesions or spondylolysis identified. IMPRESSION: No acute abnormality. Degenerative changes as described. Electronically Signed   By: Margarette Canada M.D.   On: 08/06/2015 14:08   Dg Hips Bilat With Pelvis 3-4 Views  08/06/2015  CLINICAL DATA:  Fall with bilateral hip pain, left side greater than right. EXAM: DG HIP (WITH OR WITHOUT PELVIS) 3-4V BILAT COMPARISON:  06/07/2015 FINDINGS: Pelvic bony ring is intact. SI joints are symmetric. Both hips are located without a fracture. No significant joint space narrowing in the hip joints. Stable focal sclerotic area at the junction of the right femoral head and neck. IMPRESSION: No acute bone abnormality to the pelvis or hips. Electronically Signed  By: Markus Daft M.D.   On: 08/06/2015 13:49   I have personally reviewed and evaluated these images and lab results as part of my medical decision-making.   3:29 PM Patient states that she feels better. I reviewed all findings with her, including concern for possible urinary tract infection. Chart review demonstrates no positive urine culture recently, but the patient states that she is taking ciprofloxacin successfully for prior UTI.   MDM   Final diagnoses:  Leukocytosis  Weakness   urinary tract infection  Elderly female presents with concern of weakness, and after a fall that occurred  yesterday. Here, the patient is awake, alert, in no distress, hemodynamically stable. Patient's x-rays are reassuring, with no evidence for new fracture. Patient does have new leukocytosis, and slight worsening of her chronic kidney disease. There is some evidence for urinary tract infection, likely contributing to her weakness. Patient started on ciprofloxacin, given her allergy history, discharged in stable condition to follow-up with primary care.    I personally performed the services described in this documentation, which was scribed in my presence. The recorded information has been reviewed and is accurate.     Carmin Muskrat, MD 08/06/15 [phone]

## 2015-08-06 NOTE — ED Notes (Signed)
Pt reports losing balance yesterday while getting off toilet and states she fell back on her butt and hit head on carpeted floor. Also reports fever yesterday and feeling weak.

## 2015-08-06 NOTE — Discharge Instructions (Signed)
As discussed, today's evaluation has demonstrated the presence of an infection.  It is important to take all medication as directed, and sure to follow-up with your primary care physician next 3 or 4 days for repeat evaluation.  Return here for concerning changes in your condition.

## 2015-08-08 ENCOUNTER — Telehealth: Payer: Self-pay

## 2015-08-08 ENCOUNTER — Encounter: Payer: Self-pay | Admitting: Family Medicine

## 2015-08-08 NOTE — Telephone Encounter (Signed)
Pt call this morning to cancel her TCS because she is sick and has no one to bring her home after her TCS. She will call back when she feels better.

## 2015-08-09 ENCOUNTER — Other Ambulatory Visit: Payer: Self-pay | Admitting: Urology

## 2015-08-09 ENCOUNTER — Ambulatory Visit (HOSPITAL_COMMUNITY): Admission: RE | Admit: 2015-08-09 | Payer: Medicare Other | Source: Ambulatory Visit | Admitting: Gastroenterology

## 2015-08-09 DIAGNOSIS — N201 Calculus of ureter: Secondary | ICD-10-CM

## 2015-08-09 SURGERY — COLONOSCOPY WITH PROPOFOL
Anesthesia: Monitor Anesthesia Care

## 2015-08-11 ENCOUNTER — Ambulatory Visit (INDEPENDENT_AMBULATORY_CARE_PROVIDER_SITE_OTHER): Payer: Medicare Other | Admitting: Family Medicine

## 2015-08-11 ENCOUNTER — Encounter (HOSPITAL_COMMUNITY): Payer: Self-pay

## 2015-08-11 ENCOUNTER — Emergency Department (HOSPITAL_COMMUNITY)
Admission: EM | Admit: 2015-08-11 | Discharge: 2015-08-11 | Disposition: A | Discharge status: Home / Self Care | Payer: Medicare Other | Attending: Emergency Medicine | Admitting: Emergency Medicine

## 2015-08-11 ENCOUNTER — Encounter: Payer: Self-pay | Admitting: Family Medicine

## 2015-08-11 VITALS — BP 104/60 | HR 76 | Resp 18 | Ht 66.0 in | Wt 185.1 lb

## 2015-08-11 DIAGNOSIS — Z7984 Long term (current) use of oral hypoglycemic drugs: Secondary | ICD-10-CM | POA: Insufficient documentation

## 2015-08-11 DIAGNOSIS — G8929 Other chronic pain: Secondary | ICD-10-CM | POA: Insufficient documentation

## 2015-08-11 DIAGNOSIS — R531 Weakness: Secondary | ICD-10-CM | POA: Diagnosis not present

## 2015-08-11 DIAGNOSIS — R269 Unspecified abnormalities of gait and mobility: Secondary | ICD-10-CM | POA: Diagnosis not present

## 2015-08-11 DIAGNOSIS — N390 Urinary tract infection, site not specified: Secondary | ICD-10-CM | POA: Diagnosis not present

## 2015-08-11 DIAGNOSIS — Z880 Allergy status to penicillin: Secondary | ICD-10-CM | POA: Diagnosis not present

## 2015-08-11 DIAGNOSIS — F419 Anxiety disorder, unspecified: Secondary | ICD-10-CM | POA: Insufficient documentation

## 2015-08-11 DIAGNOSIS — F05 Delirium due to known physiological condition: Secondary | ICD-10-CM | POA: Diagnosis not present

## 2015-08-11 DIAGNOSIS — R400 Somnolence: Secondary | ICD-10-CM

## 2015-08-11 DIAGNOSIS — R110 Nausea: Secondary | ICD-10-CM | POA: Diagnosis not present

## 2015-08-11 DIAGNOSIS — Z79899 Other long term (current) drug therapy: Secondary | ICD-10-CM | POA: Insufficient documentation

## 2015-08-11 DIAGNOSIS — Z9981 Dependence on supplemental oxygen: Secondary | ICD-10-CM | POA: Diagnosis not present

## 2015-08-11 DIAGNOSIS — M1990 Unspecified osteoarthritis, unspecified site: Secondary | ICD-10-CM | POA: Diagnosis not present

## 2015-08-11 DIAGNOSIS — I10 Essential (primary) hypertension: Secondary | ICD-10-CM | POA: Insufficient documentation

## 2015-08-11 DIAGNOSIS — G629 Polyneuropathy, unspecified: Secondary | ICD-10-CM | POA: Diagnosis not present

## 2015-08-11 DIAGNOSIS — Z87891 Personal history of nicotine dependence: Secondary | ICD-10-CM | POA: Insufficient documentation

## 2015-08-11 DIAGNOSIS — N289 Disorder of kidney and ureter, unspecified: Secondary | ICD-10-CM | POA: Insufficient documentation

## 2015-08-11 DIAGNOSIS — Z862 Personal history of diseases of the blood and blood-forming organs and certain disorders involving the immune mechanism: Secondary | ICD-10-CM | POA: Insufficient documentation

## 2015-08-11 DIAGNOSIS — I6789 Other cerebrovascular disease: Secondary | ICD-10-CM | POA: Diagnosis not present

## 2015-08-11 DIAGNOSIS — F411 Generalized anxiety disorder: Secondary | ICD-10-CM | POA: Diagnosis not present

## 2015-08-11 DIAGNOSIS — E119 Type 2 diabetes mellitus without complications: Secondary | ICD-10-CM | POA: Insufficient documentation

## 2015-08-11 DIAGNOSIS — R109 Unspecified abdominal pain: Secondary | ICD-10-CM | POA: Diagnosis present

## 2015-08-11 DIAGNOSIS — Z86010 Personal history of colonic polyps: Secondary | ICD-10-CM | POA: Insufficient documentation

## 2015-08-11 DIAGNOSIS — G4733 Obstructive sleep apnea (adult) (pediatric): Secondary | ICD-10-CM | POA: Insufficient documentation

## 2015-08-11 DIAGNOSIS — Z87442 Personal history of urinary calculi: Secondary | ICD-10-CM | POA: Insufficient documentation

## 2015-08-11 DIAGNOSIS — M545 Low back pain: Secondary | ICD-10-CM | POA: Diagnosis not present

## 2015-08-11 DIAGNOSIS — F329 Major depressive disorder, single episode, unspecified: Secondary | ICD-10-CM | POA: Diagnosis not present

## 2015-08-11 DIAGNOSIS — Z9889 Other specified postprocedural states: Secondary | ICD-10-CM | POA: Insufficient documentation

## 2015-08-11 DIAGNOSIS — K219 Gastro-esophageal reflux disease without esophagitis: Secondary | ICD-10-CM | POA: Insufficient documentation

## 2015-08-11 LAB — COMPREHENSIVE METABOLIC PANEL
ALT: 10 U/L — ABNORMAL LOW (ref 14–54)
AST: 16 U/L (ref 15–41)
Albumin: 3.5 g/dL (ref 3.5–5.0)
Alkaline Phosphatase: 61 U/L (ref 38–126)
Anion gap: 10 (ref 5–15)
BUN: 19 mg/dL (ref 6–20)
CO2: 24 mmol/L (ref 22–32)
Calcium: 9.7 mg/dL (ref 8.9–10.3)
Chloride: 100 mmol/L — ABNORMAL LOW (ref 101–111)
Creatinine, Ser: 1.7 mg/dL — ABNORMAL HIGH (ref 0.44–1.00)
GFR calc Af Amer: 34 mL/min — ABNORMAL LOW (ref >60)
GFR calc non Af Amer: 29 mL/min — ABNORMAL LOW (ref >60)
Glucose, Bld: 96 mg/dL (ref 65–99)
Potassium: 3.6 mmol/L (ref 3.5–5.1)
Sodium: 134 mmol/L — ABNORMAL LOW (ref 135–145)
Total Bilirubin: 0.4 mg/dL (ref 0.3–1.2)
Total Protein: 9 g/dL — ABNORMAL HIGH (ref 6.5–8.1)

## 2015-08-11 LAB — URINALYSIS, ROUTINE W REFLEX MICROSCOPIC
Bilirubin Urine: NEGATIVE
Glucose, UA: NEGATIVE mg/dL
Ketones, ur: NEGATIVE mg/dL
Nitrite: NEGATIVE
Protein, ur: 30 mg/dL — AB
Specific Gravity, Urine: 1.01 (ref 1.005–1.030)
pH: 6 (ref 5.0–8.0)

## 2015-08-11 LAB — URINE MICROSCOPIC-ADD ON

## 2015-08-11 LAB — ETHANOL: Alcohol, Ethyl (B): 5 mg/dL — ABNORMAL HIGH (ref <5)

## 2015-08-11 LAB — CBC WITH DIFFERENTIAL/PLATELET
Basophils Absolute: 0 10*3/uL (ref 0.0–0.1)
Basophils Relative: 0 %
Eosinophils Absolute: 0.2 10*3/uL (ref 0.0–0.7)
Eosinophils Relative: 2 %
HCT: 28.2 % — ABNORMAL LOW (ref 36.0–46.0)
Hemoglobin: 8.7 g/dL — ABNORMAL LOW (ref 12.0–15.0)
Lymphocytes Relative: 16 %
Lymphs Abs: 2.1 10*3/uL (ref 0.7–4.0)
MCH: 23 pg — ABNORMAL LOW (ref 26.0–34.0)
MCHC: 30.9 g/dL (ref 30.0–36.0)
MCV: 74.4 fL — ABNORMAL LOW (ref 78.0–100.0)
Monocytes Absolute: 1.3 10*3/uL — ABNORMAL HIGH (ref 0.1–1.0)
Monocytes Relative: 10 %
Neutro Abs: 9.5 10*3/uL — ABNORMAL HIGH (ref 1.7–7.7)
Neutrophils Relative %: 72 %
Platelets: 545 10*3/uL — ABNORMAL HIGH (ref 150–400)
RBC: 3.79 MIL/uL — ABNORMAL LOW (ref 3.87–5.11)
RDW: 18.6 % — ABNORMAL HIGH (ref 11.5–15.5)
WBC: 13.2 10*3/uL — ABNORMAL HIGH (ref 4.0–10.5)

## 2015-08-11 LAB — LIPASE, BLOOD: Lipase: 31 U/L (ref 11–51)

## 2015-08-11 MED ORDER — CIPROFLOXACIN HCL 250 MG PO TABS
500.0000 mg | ORAL_TABLET | Freq: Once | ORAL | Status: AC
  Administered 2015-08-11: 500 mg via ORAL
  Filled 2015-08-11: qty 2

## 2015-08-11 MED ORDER — SODIUM CHLORIDE 0.9 % IV BOLUS (SEPSIS): 1000.0000 mL | Freq: Once | INTRAVENOUS | Status: DC

## 2015-08-11 NOTE — ED Notes (Signed)
Pt drank 4 cups of ice water without difficulty

## 2015-08-11 NOTE — ED Notes (Signed)
Pt refusing IV and Dr Vanita Panda aware.  EDP spoke with pt. Continues to refuse IV. Will push fluids

## 2015-08-11 NOTE — ED Notes (Signed)
Pt here from Dr. Griffin Dakin office for evaluation of possible UTI. Pt is complaining of lower abdominal pain, nausea and vomiting for two days

## 2015-08-11 NOTE — ED Provider Notes (Signed)
CSN: [id number]     Arrival date & time 08/11/15  1648 History   First MD Initiated Contact with Patient 08/11/15 1703     Chief Complaint  Patient presents with  . Abdominal Pain     (Consider location/radiation/quality/duration/timing/severity/associated sxs/prior Treatment) HPI  Patient presents from her 35 office due to concern of ongoing generalized weakness, nausea, discomfort. Patient denies focal pain, states that she does not feel well, has not felt well in at least one week. She denies current fever, confusion, disorientation. She acknowledges not filling the antibiotics prescribed for her one week ago, when I also evaluated her here, and she was found have urinary tract infection. Notably, the patient had ureteroscopy with bilateral stone removal in the days prior to that encounter.   Past Medical History  Diagnosis Date  . Anxiety disorder   . Hyperlipidemia   . Hypertension   . Depression   . Chronic back pain   . Headache   . Type 2 diabetes mellitus ([place])   . GERD (gastroesophageal reflux disease)   . Anemia   . History of adenomatous polyp of colon     2008  . Fatty liver   . Sigmoid diverticulosis   . Left ureteral stone   . History of kidney stones   . Arthritis   . Hypercalcemia   . OSA treated with BiPAP     per study 2007  . Nephrolithiasis   . Sciatica   . Lumbar disc disease with radiculopathy   . Neuropathy, peripheral [facility])    Past Surgical History  Procedure Laterality Date  . Flexible sigmoidoscopy  09/11/2011    [id number] Internal hemorrhoids  . Esophagogastroduodenoscopy (egd) with propofol N/A 03/17/2013    Procedure: ESOPHAGOGASTRODUODENOSCOPY (EGD) WITH PROPOFOL;  Surgeon: Danie Binder, MD;  Location: [facility];  Service: Endoscopy;  Laterality: N/A;  . Savory dilation N/A 03/17/2013    Procedure: SAVORY DILATION;  Surgeon: Danie Binder, MD;  Location: [facility];  Service: Endoscopy;  Laterality: N/A;  #12.8, 14, 15, 16 dilators  used  . Biopsy N/A 03/17/2013    Procedure: GASTRIC BIOPSIES;  Surgeon: Danie Binder, MD;  Location: [facility];  Service: Endoscopy;  Laterality: N/A;  . Polypectomy N/A 03/17/2013    Procedure: GASTRIC POLYPECTOMY;  Surgeon: Danie Binder, MD;  Location: [facility];  Service: Endoscopy;  Laterality: N/A;  . Colonoscopy  last one 10/02/2011    [facility] [place] [facility], IH, NEXT TCS APR 2018  . Cysto/   right ureterosocopy laser lithotripsy stone extraction  10-13-2004  . Cysto/  right retrograde pyelogram/  placment right ureteral stent  01-10-2010  . Percutaneous nephrostolithotomy Bilateral 12/  2011     [facility]  . Cardiovascular stress test  01-01-2014    normal lexiscan cardiolite/  no ischemia/ infarct/  normal LV function and wall motion ,  ef 81%  . Transthoracic echocardiogram  01-01-2014    mild LVH/  ef [id number]  grade I diastolic dysfunction/  trivial MR, TR, and PR  . Removal right thigh cyst  2006  . Vaginal hysterectomy  1970's  . Cystoscopy with retrograde pyelogram, ureteroscopy and stent placement Left 10/21/2014    Procedure: CYSTOSCOPY WITH RETROGRADE PYELOGRAM, URETEROSCOPY AND STENT PLACEMENT;  Surgeon: Franchot Gallo, MD;  Location: [facility];  Service: Urology;  Laterality: Left;  . Holmium laser application Left [id number]    Procedure: HOLMIUM LASER APPLICATION;  Surgeon: Franchot Gallo, MD;  Location: [facility];  Service:  Urology;  Laterality: Left;  Marland Kitchen Mass excision Left 03/04/2015    Procedure: EXCISION OF SOFT TISSUE NEOPLASM LEFT ARM;  Surgeon: Aviva Signs, MD;  Location: [facility];  Service: General;  Laterality: Left;  . Esophagogastroduodenoscopy (egd) with propofol N/A 06/10/2015    Procedure: ESOPHAGOGASTRODUODENOSCOPY (EGD) WITH PROPOFOL;  Surgeon: Danie Binder, MD;  [facility];  Service: Endoscopy;  Laterality: N/A;  1245 - pt knows to arrive at 8:30  . Savory dilation N/A 06/10/2015    Procedure: SAVORY DILATION;  Surgeon:  Danie Binder, MD;  [facility];  Service: Endoscopy;  Laterality: N/A;  . Biopsy  06/10/2015    Procedure: BIOPSY;  Surgeon: Danie Binder, MD;  [facility];  Service: Endoscopy;;  gastric biopsy  . Cardiac catheterization  05-11-2003  dr Shelva Majestic ( [facility] center)    Abnormal cardiolite/   Normal coronary arteries and normal LVF,  ef  63%  . Cystoscopy/retrograde/ureteroscopy/stone extraction with basket Right 06/28/2015    Procedure: CYSTOSCOPY/RETROGRADE/URETEROSCOPY/STONE EXTRACTION WITH BASKET, RIGHT URETERAL DOUBLE J STENT PLACEMENT;  Surgeon: Franchot Gallo, MD;  Location: [facility];  Service: Urology;  Laterality: Right;  . Holmium laser application Right [id number]    Procedure: HOLMIUM LASER APPLICATION;  Surgeon: Franchot Gallo, MD;  [facility]: [facility];  Service: Urology;  Laterality: Right;  . Cystoscopy/retrograde/ureteroscopy/stone extraction with basket Bilateral 08/02/2015    Procedure: CYSTOSCOPY; BILATERAL RETROGRADE PYELOGRAMS; BILATERAL URETEROSCOPY, STONE EXTRACTION WITH BASKET;  Surgeon: Franchot Gallo, MD;  Location: [facility];  Service: Urology;  Laterality: Bilateral;   Family History  Problem Relation Age of Onset  . Hypertension Mother   . Diabetes Mother   . Heart failure Mother   . Dementia Mother   . Emphysema Father   . Hypertension Father   . Diabetes Brother   . GER disease Brother   . Cancer      family history   . Diabetes      family history   . Heart defect      famiily history   . Arthritis      family history   . Anesthesia problems Neg Hx   . Hypotension Neg Hx   . Malignant hyperthermia Neg Hx   . Pseudochol deficiency Neg Hx   . Colon cancer Neg Hx   . Hypertension Sister   . Hypertension Sister    Social History  Substance Use Topics  . Smoking status: Former Smoker -- 1.00 packs/day for 20 years    Types: Cigarettes    Quit date: 09/21/1994  . Smokeless tobacco: Never Used  . Alcohol Use: No    OB History    Gravida Para Term Preterm AB TAB SAB Ectopic Multiple Living   2 2 2       2      Review of Systems  Constitutional:       Per HPI, otherwise negative  HENT:       Per HPI, otherwise negative  Respiratory:       Per HPI, otherwise negative  Cardiovascular:       Per HPI, otherwise negative  Gastrointestinal: Positive for nausea. Negative for vomiting.  Endocrine:       Negative aside from HPI  Genitourinary:       Neg aside from HPI   Musculoskeletal:       Per HPI, otherwise negative  Skin: Negative.   Neurological: Positive for weakness. Negative for syncope.      Allergies  Ace inhibitors; Keflex; Nitrofurantoin; and Penicillins  Home Medications   Prior to Admission medications   Medication Sig Start Date End Date Taking? Authorizing Provider  ALPRAZolam Duanne Moron) 0.5 MG tablet TAKE 1 TABLET BY MOUTH TWICE A DAY 05/27/15  Yes [name], MD  amLODipine (NORVASC) 10 MG tablet Take 1 tablet (10 mg total) by mouth daily. Patient taking differently: Take 10 mg by mouth every morning.  09/09/14  Yes [name], MD  docusate sodium (COLACE) 100 MG capsule Take 200 mg by mouth at bedtime.    Yes Historical Provider, MD  gabapentin (NEURONTIN) 100 MG capsule TAKE 1 CAPSULE (100 MG TOTAL) BY MOUTH AT BEDTIME. 07/11/15  Yes [name], MD  HYDROcodone-acetaminophen (NORCO/VICODIN) 5-325 MG tablet Take 1 tablet by mouth every 6 (six) hours as needed for severe pain. 08/06/15  Yes Carmin Muskrat, MD  lovastatin (MEVACOR) 40 MG tablet TAKE 2 TABLETS (80 MG TOTAL) BY MOUTH AT BEDTIME. 06/01/15  Yes [name], MD  metFORMIN (GLUCOPHAGE) 500 MG tablet TAKE 2 TABLETS (1,000 MG TOTAL) BY MOUTH 2 (TWO) TIMES DAILY WITH A MEAL. 09/13/14  Yes [name], MD  oxybutynin (DITROPAN) 5 MG tablet Take 1 tablet (5 mg total) by mouth every 8 (eight) hours as needed for bladder spasms. 06/28/15  Yes Franchot Gallo, MD  pantoprazole (PROTONIX) 40  MG tablet TAKE 1 TABLET BY MOUTH TWICE A DAY 03/16/15  Yes Carlis Stable, NP  PARoxetine (PAXIL) 40 MG tablet TAKE 1 TABLET (40 MG TOTAL) BY MOUTH EVERY MORNING. 07/05/15  Yes [name], MD  potassium citrate (UROCIT-K) 10 MEQ (1080 MG) SR tablet TAKE 1 TABLET 3 TIMES DAILY WITH MEALS. Patient taking differently: Take 10 mEq by mouth 2 (two) times daily.  07/27/14  Yes [name], MD  ciprofloxacin (CIPRO) 500 MG tablet Take 1 tablet (500 mg total) by mouth 2 (two) times daily. Reported on 08/06/2015 Patient not taking: Reported on 08/11/2015 08/06/15   Carmin Muskrat, MD  diazepam (VALIUM) 5 MG tablet Take 1 tablet (5 mg total) by mouth every 8 (eight) hours as needed for muscle spasms. 07/14/15   Donne Hazel, MD  Vitamin D, Ergocalciferol, (DRISDOL) 50000 UNITS CAPS capsule TAKE 1 CAPSULE (50,000 UNITS TOTAL) BY MOUTH EVERY 7 (SEVEN) DAYS. Patient taking differently: Take 50,000 Units by mouth every Sunday. TAKE 1 CAPSULE (50,000 UNITS TOTAL) BY MOUTH EVERY 7 (SEVEN) DAYS. 08/30/14   [name], MD   BP 117/76 mmHg  Pulse 97  Ht 5\' 6"  (1.676 m)  Wt 185 lb (83.915 kg)  BMI 29.87 kg/m2  SpO2 91% Physical Exam  Constitutional: She is oriented to person, place, and time. She appears well-developed and well-nourished. No distress.  HENT:  Head: Normocephalic and atraumatic.  Eyes: Conjunctivae and EOM are normal.  Cardiovascular: Normal rate and regular rhythm.   Pulmonary/Chest: Effort normal and breath sounds normal. No stridor. No respiratory distress.  Abdominal: She exhibits no distension. There is no tenderness.  Musculoskeletal: She exhibits no edema.  Neurological: She is alert and oriented to person, place, and time. No cranial nerve deficit.  Skin: Skin is warm and dry.  Psychiatric: She has a normal mood and affect.  Nursing note and vitals reviewed.   ED Course  Procedures (including critical care time) Labs Review Labs Reviewed  COMPREHENSIVE METABOLIC  PANEL - Abnormal; Notable for the following:    Sodium 134 (*)    Chloride 100 (*)  Creatinine, Ser 1.70 (*)    Total Protein 9.0 (*)    ALT 10 (*)    GFR calc non Af Amer 29 (*)    GFR calc Af Amer 34 (*)    All other components within normal limits  ETHANOL - Abnormal; Notable for the following:    Alcohol, Ethyl (B) 5 (*)    All other components within normal limits  CBC WITH DIFFERENTIAL/PLATELET - Abnormal; Notable for the following:    WBC 13.2 (*)    RBC 3.79 (*)    Hemoglobin 8.7 (*)    HCT 28.2 (*)    MCV 74.4 (*)    MCH 23.0 (*)    RDW 18.6 (*)    Platelets 545 (*)    Neutro Abs 9.5 (*)    Monocytes Absolute 1.3 (*)    All other components within normal limits  URINALYSIS, ROUTINE W REFLEX MICROSCOPIC (NOT AT [facility]) - Abnormal; Notable for the following:    APPearance HAZY (*)    Hgb urine dipstick LARGE (*)    Protein, ur 30 (*)    Leukocytes, UA MODERATE (*)    All other components within normal limits  URINE MICROSCOPIC-ADD ON - Abnormal; Notable for the following:    Squamous Epithelial / LPF 0-5 (*)    Bacteria, UA MANY (*)    All other components within normal limits  LIPASE, BLOOD   7:23 PM Patient remains awake and alert. We discussed all findings.  Patient's leukocytosis has diminished since one week ago, creatinine is slightly decreased. There is still evidence for urinary tract infection. I advocated for IV fluids, antibiotics, admission. Patient was not amenable to display, refused IV therapy, admission. Patient states that she will drink water, take oral antibiotic. She will follow-up with primary care.  MDM  Patient presents one week after I initially evaluated her, and she was found to have UTI.  Tonight, she has continued e/o UTI, and mild renal dysfunction.  Patient received oral therapy here, and deferred recommendation for IV therapy, admission. With no evidence for peritonitis, bacteremia, sepsis, patient's request for outpatient  management was accommodated.   Carmin Muskrat, MD 08/11/15 1925

## 2015-08-11 NOTE — Discharge Instructions (Signed)
As discussed, it is very important that you take all medication as directed, and follow-up with your physician.  Return here for concerning changes in your condition.

## 2015-08-11 NOTE — ED Notes (Signed)
Pt refusing IV . Dr Vanita Panda aware

## 2015-08-11 NOTE — Progress Notes (Signed)
   Subjective:    Patient ID: Cheryl Abbott, female    DOB: 01-28-1946, 70 y.o.   MRN: [id number]  HPI Pt in for follow up of recent ED visit for urinary tract infection C/o poor appette, increased confusion, and fatigue, has been lying in bed most of the time Does not feel safe to drive generally. She just filled the antibiotic prescribed today, and has not taken any as yet, of note her WBC was markedly elevated when seen in the ED . She has had several urologic procedures since past 2 months due to heavy burden of nephrolithiasis   Review of Systems See HPI Denies recent fever or chills. Denies sinus pressure, nasal congestion, ear pain or sore throat. Denies chest congestion, productive cough or wheezing. Denies chest pains, palpitations and leg swelling  Denies headaches, seizures, numbness, or tingling. Denies uncontrolled depression, anxiety or insomnia. Denies skin break down or rash.        Objective:   Physical Exam BP 104/60 mmHg  Pulse 76  Resp 18  Ht 5\' 6"  (1.676 m)  Wt 185 lb 1.9 oz (83.97 kg)  BMI 29.89 kg/m2  SpO2 95%  Patient drowsy and confused lying prone on her stomach, states she is too weak to sit up and be placed in wheelchair for transport to Ed , and also states she has no one nearby to call to assist her getting to the ED  HEENT: neck adequate ROM  Chest: Clear to auscultation bilaterally.  CVS: S1, S2 no murmurs, no S3.Regular rate.  ABD: not examined, unable tr assess, pt lying on stomach. Flank tenderness elicited, however this is normal for her  Ext: No edema          Assessment & Plan:  Urinary tract infection, site not specified Untreated UTI due to pt non compliance , presenting for ED follow up excessively drowsy and slightly confused. Unsafe to even get her from lying to sitting position o in the office, need to call EMS services for transport to the ED Has had recurrent kidney stones requiring procedures since past 2  months. Most recent ED visit showed leukocytosis with a shift, needs re evaluation for possible sepsis. Direct contact  made with Ed  Provider   Drowsiness Reports excessive drowsiness at visit, speech slurred and reduced muscle tone on exam, to the extent that felt to be unsafe to attempt to place pt in wheelchair, EMS to transport pt to ED

## 2015-08-11 NOTE — Patient Instructions (Signed)
You will be transported to the ED for further assessment due to severe confusion.  It is unsafe for you to either drive or to be at home  until you are fully assessed  I have spoken directly with the ED Doctor and you are expected   Follow up in 7 to 12 days

## 2015-08-12 MED FILL — Hydrocodone-Acetaminophen Tab 5-325 MG: ORAL | Qty: 6 | Status: AC

## 2015-08-15 DIAGNOSIS — N390 Urinary tract infection, site not specified: Secondary | ICD-10-CM | POA: Insufficient documentation

## 2015-08-15 DIAGNOSIS — R400 Somnolence: Secondary | ICD-10-CM | POA: Insufficient documentation

## 2015-08-15 NOTE — Assessment & Plan Note (Addendum)
Untreated UTI due to pt non compliance , presenting for ED follow up excessively drowsy and slightly confused. Unsafe to even get her from lying to sitting position o in the office, need to call EMS services for transport to the ED Has had recurrent kidney stones requiring procedures since past 2 months. Most recent ED visit showed leukocytosis with a shift, needs re evaluation for possible sepsis. Direct contact  made with Ed  Provider

## 2015-08-15 NOTE — Assessment & Plan Note (Signed)
Reports excessive drowsiness at visit, speech slurred and reduced muscle tone on exam, to the extent that felt to be unsafe to attempt to place pt in wheelchair, EMS to transport pt to ED

## 2015-08-18 ENCOUNTER — Ambulatory Visit: Payer: Medicare Other | Admitting: Family Medicine

## 2015-08-18 ENCOUNTER — Ambulatory Visit (HOSPITAL_COMMUNITY)
Admission: RE | Admit: 2015-08-18 | Discharge: 2015-08-18 | Disposition: A | Discharge status: Home / Self Care | Payer: Medicare Other | Source: Ambulatory Visit | Attending: Urology | Admitting: Urology

## 2015-08-18 ENCOUNTER — Emergency Department (HOSPITAL_COMMUNITY): Payer: Medicare Other

## 2015-08-18 ENCOUNTER — Observation Stay (HOSPITAL_COMMUNITY)
Admission: EM | Admit: 2015-08-18 | Discharge: 2015-08-22 | Disposition: A | Discharge status: Home / Self Care | Payer: Medicare Other | Attending: Internal Medicine | Admitting: Internal Medicine

## 2015-08-18 ENCOUNTER — Encounter (HOSPITAL_COMMUNITY): Payer: Self-pay | Admitting: Emergency Medicine

## 2015-08-18 DIAGNOSIS — N390 Urinary tract infection, site not specified: Principal | ICD-10-CM | POA: Diagnosis present

## 2015-08-18 DIAGNOSIS — Z79899 Other long term (current) drug therapy: Secondary | ICD-10-CM | POA: Insufficient documentation

## 2015-08-18 DIAGNOSIS — M25561 Pain in right knee: Secondary | ICD-10-CM | POA: Insufficient documentation

## 2015-08-18 DIAGNOSIS — Z6834 Body mass index (BMI) 34.0-34.9, adult: Secondary | ICD-10-CM | POA: Diagnosis not present

## 2015-08-18 DIAGNOSIS — M25562 Pain in left knee: Secondary | ICD-10-CM | POA: Insufficient documentation

## 2015-08-18 DIAGNOSIS — E1122 Type 2 diabetes mellitus with diabetic chronic kidney disease: Secondary | ICD-10-CM | POA: Insufficient documentation

## 2015-08-18 DIAGNOSIS — R51 Headache: Secondary | ICD-10-CM | POA: Insufficient documentation

## 2015-08-18 DIAGNOSIS — F329 Major depressive disorder, single episode, unspecified: Secondary | ICD-10-CM | POA: Diagnosis not present

## 2015-08-18 DIAGNOSIS — Y999 Unspecified external cause status: Secondary | ICD-10-CM | POA: Diagnosis not present

## 2015-08-18 DIAGNOSIS — N131 Hydronephrosis with ureteral stricture, not elsewhere classified: Secondary | ICD-10-CM | POA: Diagnosis not present

## 2015-08-18 DIAGNOSIS — N201 Calculus of ureter: Secondary | ICD-10-CM

## 2015-08-18 DIAGNOSIS — N132 Hydronephrosis with renal and ureteral calculous obstruction: Secondary | ICD-10-CM | POA: Diagnosis not present

## 2015-08-18 DIAGNOSIS — R52 Pain, unspecified: Secondary | ICD-10-CM | POA: Diagnosis not present

## 2015-08-18 DIAGNOSIS — W19XXXA Unspecified fall, initial encounter: Secondary | ICD-10-CM | POA: Diagnosis not present

## 2015-08-18 DIAGNOSIS — F17210 Nicotine dependence, cigarettes, uncomplicated: Secondary | ICD-10-CM | POA: Insufficient documentation

## 2015-08-18 DIAGNOSIS — D72829 Elevated white blood cell count, unspecified: Secondary | ICD-10-CM | POA: Insufficient documentation

## 2015-08-18 DIAGNOSIS — K219 Gastro-esophageal reflux disease without esophagitis: Secondary | ICD-10-CM | POA: Diagnosis not present

## 2015-08-18 DIAGNOSIS — I129 Hypertensive chronic kidney disease with stage 1 through stage 4 chronic kidney disease, or unspecified chronic kidney disease: Secondary | ICD-10-CM | POA: Insufficient documentation

## 2015-08-18 DIAGNOSIS — E669 Obesity, unspecified: Secondary | ICD-10-CM | POA: Insufficient documentation

## 2015-08-18 DIAGNOSIS — S0990XA Unspecified injury of head, initial encounter: Secondary | ICD-10-CM | POA: Diagnosis not present

## 2015-08-18 DIAGNOSIS — R531 Weakness: Secondary | ICD-10-CM

## 2015-08-18 DIAGNOSIS — N183 Chronic kidney disease, stage 3 unspecified: Secondary | ICD-10-CM | POA: Diagnosis present

## 2015-08-18 DIAGNOSIS — G459 Transient cerebral ischemic attack, unspecified: Secondary | ICD-10-CM | POA: Diagnosis present

## 2015-08-18 DIAGNOSIS — M25552 Pain in left hip: Secondary | ICD-10-CM | POA: Diagnosis not present

## 2015-08-18 DIAGNOSIS — Y93E2 Activity, laundry: Secondary | ICD-10-CM | POA: Insufficient documentation

## 2015-08-18 DIAGNOSIS — I10 Essential (primary) hypertension: Secondary | ICD-10-CM | POA: Diagnosis present

## 2015-08-18 DIAGNOSIS — E785 Hyperlipidemia, unspecified: Secondary | ICD-10-CM | POA: Diagnosis not present

## 2015-08-18 DIAGNOSIS — R509 Fever, unspecified: Secondary | ICD-10-CM | POA: Diagnosis not present

## 2015-08-18 DIAGNOSIS — M1991 Primary osteoarthritis, unspecified site: Secondary | ICD-10-CM | POA: Diagnosis not present

## 2015-08-18 DIAGNOSIS — Z7984 Long term (current) use of oral hypoglycemic drugs: Secondary | ICD-10-CM | POA: Insufficient documentation

## 2015-08-18 DIAGNOSIS — G4489 Other headache syndrome: Secondary | ICD-10-CM | POA: Diagnosis not present

## 2015-08-18 DIAGNOSIS — E1121 Type 2 diabetes mellitus with diabetic nephropathy: Secondary | ICD-10-CM | POA: Diagnosis not present

## 2015-08-18 DIAGNOSIS — M25569 Pain in unspecified knee: Secondary | ICD-10-CM | POA: Diagnosis not present

## 2015-08-18 DIAGNOSIS — G4733 Obstructive sleep apnea (adult) (pediatric): Secondary | ICD-10-CM | POA: Diagnosis not present

## 2015-08-18 DIAGNOSIS — T148 Other injury of unspecified body region: Secondary | ICD-10-CM | POA: Diagnosis not present

## 2015-08-18 DIAGNOSIS — Y9289 Other specified places as the place of occurrence of the external cause: Secondary | ICD-10-CM | POA: Insufficient documentation

## 2015-08-18 DIAGNOSIS — M25551 Pain in right hip: Secondary | ICD-10-CM | POA: Diagnosis not present

## 2015-08-18 DIAGNOSIS — Y92009 Unspecified place in unspecified non-institutional (private) residence as the place of occurrence of the external cause: Secondary | ICD-10-CM | POA: Diagnosis present

## 2015-08-18 DIAGNOSIS — N200 Calculus of kidney: Secondary | ICD-10-CM

## 2015-08-18 DIAGNOSIS — I82409 Acute embolism and thrombosis of unspecified deep veins of unspecified lower extremity: Secondary | ICD-10-CM | POA: Diagnosis not present

## 2015-08-18 DIAGNOSIS — M549 Dorsalgia, unspecified: Secondary | ICD-10-CM | POA: Diagnosis not present

## 2015-08-18 DIAGNOSIS — E663 Overweight: Secondary | ICD-10-CM | POA: Diagnosis present

## 2015-08-18 HISTORY — DX: Cerebrovascular disease, unspecified: I67.9

## 2015-08-18 LAB — URINE MICROSCOPIC-ADD ON

## 2015-08-18 LAB — LACTIC ACID, PLASMA
Lactic Acid, Venous: 1.3 mmol/L (ref 0.5–2.0)
Lactic Acid, Venous: 1.3 mmol/L (ref 0.5–2.0)

## 2015-08-18 LAB — CBC WITH DIFFERENTIAL/PLATELET
Basophils Absolute: 0 10*3/uL (ref 0.0–0.1)
Basophils Relative: 0 %
Eosinophils Absolute: 0.2 10*3/uL (ref 0.0–0.7)
Eosinophils Relative: 1 %
HCT: 27.6 % — ABNORMAL LOW (ref 36.0–46.0)
Hemoglobin: 8.5 g/dL — ABNORMAL LOW (ref 12.0–15.0)
Lymphocytes Relative: 10 %
Lymphs Abs: 1.6 10*3/uL (ref 0.7–4.0)
MCH: 22.5 pg — ABNORMAL LOW (ref 26.0–34.0)
MCHC: 30.8 g/dL (ref 30.0–36.0)
MCV: 73 fL — ABNORMAL LOW (ref 78.0–100.0)
Monocytes Absolute: 1.4 10*3/uL — ABNORMAL HIGH (ref 0.1–1.0)
Monocytes Relative: 9 %
Neutro Abs: 12.4 10*3/uL — ABNORMAL HIGH (ref 1.7–7.7)
Neutrophils Relative %: 80 %
Platelets: 534 10*3/uL — ABNORMAL HIGH (ref 150–400)
RBC: 3.78 MIL/uL — ABNORMAL LOW (ref 3.87–5.11)
RDW: 18.9 % — ABNORMAL HIGH (ref 11.5–15.5)
WBC: 15.6 10*3/uL — ABNORMAL HIGH (ref 4.0–10.5)

## 2015-08-18 LAB — URINALYSIS, ROUTINE W REFLEX MICROSCOPIC
Bilirubin Urine: NEGATIVE
Glucose, UA: NEGATIVE mg/dL
Ketones, ur: NEGATIVE mg/dL
Nitrite: NEGATIVE
Specific Gravity, Urine: <1.005 — ABNORMAL LOW (ref 1.005–1.030)
pH: 5.5 (ref 5.0–8.0)

## 2015-08-18 LAB — BASIC METABOLIC PANEL
Anion gap: 8 (ref 5–15)
BUN: 16 mg/dL (ref 6–20)
CO2: 25 mmol/L (ref 22–32)
Calcium: 9.5 mg/dL (ref 8.9–10.3)
Chloride: 98 mmol/L — ABNORMAL LOW (ref 101–111)
Creatinine, Ser: 1.69 mg/dL — ABNORMAL HIGH (ref 0.44–1.00)
GFR calc Af Amer: 34 mL/min — ABNORMAL LOW (ref >60)
GFR calc non Af Amer: 29 mL/min — ABNORMAL LOW (ref >60)
Glucose, Bld: 119 mg/dL — ABNORMAL HIGH (ref 65–99)
Potassium: 4.2 mmol/L (ref 3.5–5.1)
Sodium: 131 mmol/L — ABNORMAL LOW (ref 135–145)

## 2015-08-18 MED ORDER — ACETAMINOPHEN 325 MG PO TABS
650.0000 mg | ORAL_TABLET | Freq: Once | ORAL | Status: AC
  Administered 2015-08-18: 650 mg via ORAL
  Filled 2015-08-18: qty 2

## 2015-08-18 MED ORDER — DEXTROSE 5 % IV SOLN
1.0000 g | Freq: Once | INTRAVENOUS | Status: AC
  Administered 2015-08-18: 1 g via INTRAVENOUS
  Filled 2015-08-18: qty 10

## 2015-08-18 MED ORDER — MORPHINE SULFATE (PF) 2 MG/ML IV SOLN
2.0000 mg | INTRAVENOUS | Status: DC | PRN
  Administered 2015-08-18 – 2015-08-19 (×5): 2 mg via INTRAVENOUS
  Filled 2015-08-18 (×5): qty 1

## 2015-08-18 MED ORDER — ONDANSETRON HCL 4 MG/2ML IJ SOLN
4.0000 mg | Freq: Once | INTRAMUSCULAR | Status: AC
  Administered 2015-08-18: 4 mg via INTRAVENOUS
  Filled 2015-08-18: qty 2

## 2015-08-18 NOTE — ED Provider Notes (Signed)
CSN: [id number]     Arrival date & time 08/18/15  1538 History   First MD Initiated Contact with Patient 08/18/15 1607     Chief Complaint  Patient presents with  . Fall     (Consider location/radiation/quality/duration/timing/severity/associated sxs/prior Treatment) HPI   Cheryl Abbott is a 70 y.o. female who presents to the Emergency Department complaining of persistent generalized weakness, nausea and malaise for more than one week.  She states that she fell earlier today while trying to do laundry and contributes the fall to her weakness and recent illness..  Complains of pain to the back of her head, both hips and knees.  She states that she is currently under the care of a urologist for recent bilateral kidney stones and was started on Cipro on her last ER visit.  She states she has three pills left.  She denies LOC, visual changes, chest pain, vomiting and fever.  She is also taking ASA therapy.     Past Medical History  Diagnosis Date  . Anxiety disorder   . Hyperlipidemia   . Hypertension   . Depression   . Chronic back pain   . Headache   . Type 2 diabetes mellitus ([place])   . GERD (gastroesophageal reflux disease)   . Anemia   . History of adenomatous polyp of colon     2008  . Fatty liver   . Sigmoid diverticulosis   . Left ureteral stone   . History of kidney stones   . Arthritis   . Hypercalcemia   . OSA treated with BiPAP     per study 2007  . Nephrolithiasis   . Sciatica   . Lumbar disc disease with radiculopathy   . Neuropathy, peripheral [facility])    Past Surgical History  Procedure Laterality Date  . Flexible sigmoidoscopy  09/11/2011    [id number] Internal hemorrhoids  . Esophagogastroduodenoscopy (egd) with propofol N/A 03/17/2013    Procedure: ESOPHAGOGASTRODUODENOSCOPY (EGD) WITH PROPOFOL;  Surgeon: Danie Binder, MD;  Location: [facility];  Service: Endoscopy;  Laterality: N/A;  . Savory dilation N/A 03/17/2013    Procedure: SAVORY DILATION;  Surgeon:  Danie Binder, MD;  Location: [facility];  Service: Endoscopy;  Laterality: N/A;  #12.8, 14, 15, 16 dilators used  . Biopsy N/A 03/17/2013    Procedure: GASTRIC BIOPSIES;  Surgeon: Danie Binder, MD;  Location: [facility];  Service: Endoscopy;  Laterality: N/A;  . Polypectomy N/A 03/17/2013    Procedure: GASTRIC POLYPECTOMY;  Surgeon: Danie Binder, MD;  Location: [facility];  Service: Endoscopy;  Laterality: N/A;  . Colonoscopy  last one 10/02/2011    MOD [place] TICS, IH, NEXT TCS APR 2018  . Cysto/   right ureterosocopy laser lithotripsy stone extraction  10-13-2004  . Cysto/  right retrograde pyelogram/  placment right ureteral stent  01-10-2010  . Percutaneous nephrostolithotomy Bilateral 12/  2011     [facility]  . Cardiovascular stress test  01-01-2014    normal lexiscan cardiolite/  no ischemia/ infarct/  normal LV function and wall motion ,  ef 81%  . Transthoracic echocardiogram  01-01-2014    mild LVH/  ef [id number]  grade I diastolic dysfunction/  trivial MR, TR, and PR  . Removal right thigh cyst  2006  . Vaginal hysterectomy  1970's  . Cystoscopy with retrograde pyelogram, ureteroscopy and stent placement Left 10/21/2014    Procedure: CYSTOSCOPY WITH RETROGRADE PYELOGRAM, URETEROSCOPY AND STENT PLACEMENT;  Surgeon: Franchot Gallo, MD;  Location:  [place];  Service: Urology;  Laterality: Left;  . Holmium laser application Left [id number]    Procedure: HOLMIUM LASER APPLICATION;  Surgeon: Franchot Gallo, MD;  Location: [facility];  Service: Urology;  Laterality: Left;  Marland Kitchen Mass excision Left 03/04/2015    Procedure: EXCISION OF SOFT TISSUE NEOPLASM LEFT ARM;  Surgeon: Aviva Signs, MD;  Location: [facility];  Service: General;  Laterality: Left;  . Esophagogastroduodenoscopy (egd) with propofol N/A 06/10/2015    Procedure: ESOPHAGOGASTRODUODENOSCOPY (EGD) WITH PROPOFOL;  Surgeon: Danie Binder, MD;  [facility];  Service: Endoscopy;  Laterality: N/A;  1245 -  pt knows to arrive at 8:30  . Savory dilation N/A 06/10/2015    Procedure: SAVORY DILATION;  Surgeon: Danie Binder, MD;  [facility];  Service: Endoscopy;  Laterality: N/A;  . Biopsy  06/10/2015    Procedure: BIOPSY;  Surgeon: Danie Binder, MD;  [facility];  Service: Endoscopy;;  gastric biopsy  . Cardiac catheterization  05-11-2003  dr Shelva Majestic ([facility])    Abnormal cardiolite/   Normal coronary arteries and normal LVF,  ef  63%  . Cystoscopy/retrograde/ureteroscopy/stone extraction with basket Right 06/28/2015    Procedure: CYSTOSCOPY/RETROGRADE/URETEROSCOPY/STONE EXTRACTION WITH BASKET, RIGHT URETERAL DOUBLE J STENT PLACEMENT;  Surgeon: Franchot Gallo, MD;  [facility];  Service: Urology;  Laterality: Right;  . Holmium laser application Right [id number]    Procedure: HOLMIUM LASER APPLICATION;  Surgeon: Franchot Gallo, MD;  [facility];  Service: Urology;  Laterality: Right;  . Cystoscopy/retrograde/ureteroscopy/stone extraction with basket Bilateral 08/02/2015    Procedure: CYSTOSCOPY; BILATERAL RETROGRADE PYELOGRAMS; BILATERAL URETEROSCOPY, STONE EXTRACTION WITH BASKET;  Surgeon: Franchot Gallo, MD;  Location: [facility];  Service: Urology;  Laterality: Bilateral;   Family History  Problem Relation Age of Onset  . Hypertension Mother   . Diabetes Mother   . Heart failure Mother   . Dementia Mother   . Emphysema Father   . Hypertension Father   . Diabetes Brother   . GER disease Brother   . Cancer      family history   . Diabetes      family history   . Heart defect      famiily history   . Arthritis      family history   . Anesthesia problems Neg Hx   . Hypotension Neg Hx   . Malignant hyperthermia Neg Hx   . Pseudochol deficiency Neg Hx   . Colon cancer Neg Hx   . Hypertension Sister   . Hypertension Sister    Social History  Substance Use Topics  . Smoking status: Former Smoker -- 1.00 packs/day for 20 years     Types: Cigarettes    Quit date: 09/21/1994  . Smokeless tobacco: Never Used  . Alcohol Use: No   OB History    Gravida Para Term Preterm AB TAB SAB Ectopic Multiple Living   2 2 2       2      Review of Systems  Constitutional: Positive for fever and chills. Negative for appetite change.  Eyes: Negative for visual disturbance.  Respiratory: Negative for cough, chest tightness and shortness of breath.   Cardiovascular: Negative for chest pain.  Gastrointestinal: Negative for nausea and vomiting.  Genitourinary: Positive for dysuria and difficulty urinating.  Musculoskeletal: Positive for arthralgias (bilateral hip and knee pain). Negative for joint swelling and neck pain.  Skin: Negative for wound.  Neurological: Positive  for weakness (generalized). Negative for syncope and numbness.  All other systems reviewed and are negative.     Allergies  Ace inhibitors; Keflex; Nitrofurantoin; and Penicillins  Home Medications   Prior to Admission medications   Medication Sig Start Date End Date Taking? Authorizing Provider  ALPRAZolam Duanne Moron) 0.5 MG tablet TAKE 1 TABLET BY MOUTH TWICE A DAY 05/27/15  Yes [name], MD  amLODipine (NORVASC) 10 MG tablet Take 1 tablet (10 mg total) by mouth daily. Patient taking differently: Take 10 mg by mouth every morning.  09/09/14  Yes [name], MD  docusate sodium (COLACE) 100 MG capsule Take 200 mg by mouth at bedtime.    Yes Historical Provider, MD  gabapentin (NEURONTIN) 100 MG capsule TAKE 1 CAPSULE (100 MG TOTAL) BY MOUTH AT BEDTIME. 07/11/15  Yes [name], MD  lovastatin (MEVACOR) 40 MG tablet TAKE 2 TABLETS (80 MG TOTAL) BY MOUTH AT BEDTIME. 06/01/15  Yes [name], MD  metFORMIN (GLUCOPHAGE) 500 MG tablet TAKE 2 TABLETS (1,000 MG TOTAL) BY MOUTH 2 (TWO) TIMES DAILY WITH A MEAL. 09/13/14  Yes [name], MD  pantoprazole (PROTONIX) 40 MG tablet TAKE 1 TABLET BY MOUTH TWICE A DAY 03/16/15  Yes Carlis Stable, NP  PARoxetine (PAXIL) 40 MG tablet TAKE 1 TABLET (40 MG TOTAL) BY MOUTH EVERY MORNING. 07/05/15  Yes [name], MD  potassium citrate (UROCIT-K) 10 MEQ (1080 MG) SR tablet TAKE 1 TABLET 3 TIMES DAILY WITH MEALS. Patient taking differently: Take 10 mEq by mouth 2 (two) times daily.  07/27/14  Yes [name], MD  Vitamin D, Ergocalciferol, (DRISDOL) 50000 UNITS CAPS capsule TAKE 1 CAPSULE (50,000 UNITS TOTAL) BY MOUTH EVERY 7 (SEVEN) DAYS. Patient taking differently: Take 50,000 Units by mouth every Sunday. TAKE 1 CAPSULE (50,000 UNITS TOTAL) BY MOUTH EVERY 7 (SEVEN) DAYS. 08/30/14  Yes [name], MD  ciprofloxacin (CIPRO) 500 MG tablet Take 1 tablet (500 mg total) by mouth 2 (two) times daily. Reported on 08/06/2015 Patient not taking: Reported on 08/11/2015 08/06/15   Carmin Muskrat, MD  diazepam (VALIUM) 5 MG tablet Take 1 tablet (5 mg total) by mouth every 8 (eight) hours as needed for muscle spasms. Patient not taking: Reported on 08/18/2015 07/14/15   Donne Hazel, MD  HYDROcodone-acetaminophen (NORCO/VICODIN) 5-325 MG tablet Take 1 tablet by mouth every 6 (six) hours as needed for severe pain. Patient taking differently: Take 1 tablet by mouth every 6 (six) hours as needed for moderate pain or severe pain.  08/06/15   Carmin Muskrat, MD  oxybutynin (DITROPAN) 5 MG tablet Take 1 tablet (5 mg total) by mouth every 8 (eight) hours as needed for bladder spasms. Patient not taking: Reported on 08/18/2015 06/28/15   Franchot Gallo, MD   BP 124/70 mmHg  Pulse 99  Temp(Src) 99.2 F (37.3 C) (Oral)  Resp 20  Ht 5\' 6"  (1.676 m)  Wt 83.008 kg  BMI 29.55 kg/m2  SpO2 95% Physical Exam  Constitutional: She is oriented to person, place, and time. She appears well-developed and well-nourished. No distress.  HENT:  Mouth/Throat: Oropharynx is clear and moist.  Mild tenderness of the posterior scalp.  No abrasions or definate hematoma  Eyes: Conjunctivae and EOM are normal.  Pupils are equal, round, and reactive to light.  Neck: Normal range of motion. Neck supple.  Cardiovascular: Normal heart sounds and intact distal pulses.  Tachycardia present.   Pulmonary/Chest: Effort normal and breath  sounds normal. No respiratory distress. She exhibits no tenderness.  Abdominal: Soft. She exhibits no distension and no mass. There is no tenderness. There is no rebound and no guarding.  Musculoskeletal: She exhibits no edema.  ttp bilateral anterior knees, no abrasions or edema.  ttp of the bilateral hips also without edema, bruising or abrasions.  No spinal tenderness.  Neurological: She is alert and oriented to person, place, and time. She exhibits normal muscle tone. Coordination normal.  Skin: Skin is warm. No rash noted.  Psychiatric: She has a normal mood and affect.    ED Course  Procedures (including critical care time) Labs Review Labs Reviewed  BASIC METABOLIC PANEL - Abnormal; Notable for the following:    Sodium 131 (*)    Chloride 98 (*)    Glucose, Bld 119 (*)    Creatinine, Ser 1.69 (*)    GFR calc non Af Amer 29 (*)    GFR calc Af Amer 34 (*)    All other components within normal limits  URINALYSIS, ROUTINE W REFLEX MICROSCOPIC (NOT AT [facility]) - Abnormal; Notable for the following:    Color, Urine STRAW (*)    Specific Gravity, Urine <1.005 (*)    Hgb urine dipstick SMALL (*)    Protein, ur TRACE (*)    Leukocytes, UA LARGE (*)    All other components within normal limits  CBC WITH DIFFERENTIAL/PLATELET - Abnormal; Notable for the following:    WBC 15.6 (*)    RBC 3.78 (*)    Hemoglobin 8.5 (*)    HCT 27.6 (*)    MCV 73.0 (*)    MCH 22.5 (*)    RDW 18.9 (*)    Platelets 534 (*)    Neutro Abs 12.4 (*)    Monocytes Absolute 1.4 (*)    All other components within normal limits  URINE MICROSCOPIC-ADD ON - Abnormal; Notable for the following:    Squamous Epithelial / LPF TOO NUMEROUS TO COUNT (*)    Bacteria, UA MANY (*)    All other  components within normal limits  URINE CULTURE  LACTIC ACID, PLASMA  LACTIC ACID, PLASMA    Imaging Review Dg Chest 2 View  08/18/2015  CLINICAL DATA:  Fever.  Fall EXAM: CHEST  2 VIEW COMPARISON:  08/06/2015 FINDINGS: Elevated right hemidiaphragm with right lower lobe scarring unchanged from the prior study. Negative for heart failure. No edema or effusion. Negative for mass or pneumonia. IMPRESSION: No active cardiopulmonary disease. Electronically Signed   By: Franchot Gallo M.D.   On: 08/18/2015 18:49   Ct Head Wo Contrast  08/18/2015  CLINICAL DATA:  Post fall at home hitting posterior aspect of the head. EXAM: CT HEAD WITHOUT CONTRAST TECHNIQUE: Contiguous axial images were obtained from the base of the skull through the vertex without intravenous contrast. COMPARISON:  05/10/2013; brain MRI -11/27/2011 FINDINGS: There is a potential minimal amount of subcutaneous stranding about the high right posterior parietal calvaria (image 32, series 2) without associated displaced calvarial fracture or radiopaque foreign body. Similar findings of centralized atrophy with mild ex vacuo dilatation of the ventricular system. Scattered minimal periventricular hypodensities compatible microvascular ischemic disease. The gray-white differentiation is otherwise well maintained without CT evidence of acute large territory infarct. No intraparenchymal or extra-axial mass or hemorrhage. Unchanged size a configuration of the ventricles and basilar cisterns. No midline shift. Intracranial atherosclerosis. Limited visualization the paranasal sinuses and mastoid air cells is normal. IMPRESSION: 1. Very minimal amount of soft tissue stranding  about the high right posterior parietal calvarium without associated displaced calvarial fracture, radiopaque foreign body or acute intracranial process. 2. Similar findings of centralized atrophy and microvascular ischemic disease. Electronically Signed   By: Sandi Mariscal M.D.   On:  08/18/2015 17:30   US Renal  08/18/2015  CLINICAL DATA:  Ureteral calculus. EXAM: RENAL / URINARY TRACT ULTRASOUND COMPLETE COMPARISON:  CT 07/26/2015. FINDINGS: Right Kidney: Length: 13.6 cm. Echogenicity within normal limits. No mass. Prominent right hydronephrosis again noted. Probable right nephrolithiasis. Left Kidney: Length: 13.2 cm. Echogenicity within normal limits. No mass or hydronephrosis visualized. Probable left nephrolithiasis. Bladder: Appears normal for degree of bladder distention. No ureteral jets identified. Patient refused postvoid exam. IMPRESSION: Prominent right hydronephrosis again noted. Bilateral nephrolithiasis. Electronically Signed   By: Marcello Moores  Register   On: 08/18/2015 11:00   Dg Knee Complete 4 Views Left  08/18/2015  CLINICAL DATA:  Fall.  Bilateral hip and knee pain EXAM: LEFT KNEE - COMPLETE 4+ VIEW COMPARISON:  07/24/2004 FINDINGS: There is no evidence of fracture, dislocation, or joint effusion. There is no evidence of arthropathy or other focal bone abnormality. Soft tissues are unremarkable. IMPRESSION: Negative. Electronically Signed   By: Franchot Gallo M.D.   On: 08/18/2015 17:01   Dg Knee Complete 4 Views Right  08/18/2015  CLINICAL DATA:  Bilateral hip and bilateral knee pain after falling while taking clothes out of her dryer today. PT states she has had several falls in the past. EXAM: RIGHT KNEE - COMPLETE 4+ VIEW COMPARISON:  None. FINDINGS: There is no evidence of fracture, dislocation, or joint effusion. There is generalized osteopenia. Soft tissues are unremarkable. IMPRESSION: No acute osseous injury of the right knee. Electronically Signed   By: Kathreen Devoid   On: 08/18/2015 17:01   Dg Hips Bilat With Pelvis 3-4 Views  08/18/2015  CLINICAL DATA:  Fall.  Bilateral hip pain EXAM: DG HIP (WITH OR WITHOUT PELVIS) 3-4V BILAT COMPARISON:  08/06/2015 FINDINGS: There is no evidence of hip fracture or dislocation. There is no evidence of arthropathy or other focal  bone abnormality. IMPRESSION: Negative. Electronically Signed   By: Franchot Gallo M.D.   On: 08/18/2015 17:07   I have personally reviewed and evaluated these images and lab results as part of my medical decision-making.   EKG Interpretation None      MDM   Final diagnoses:  Fall, initial encounter  Complicated urinary tract infection    Pt is non-toxic appearing.  Low grade fever and mild tachycardia.  Seen here on 08/11/15 for UTI and currently taking cipro.  Failing outpatient therapy and possible urosepsis.  Urine culture obtained today.  No previous since January.  Will start rocephin, lactic acid pending.  Discussed with Dr. Oleta Mouse.   I will consult hospitalist for admission.    2040  Consulted Dr. Darrick Meigs, will admit   Cheryl Parkinson, PA-C 08/18/15 2052  Forde Dandy, MD 08/19/15 1300

## 2015-08-18 NOTE — H&P (Signed)
PCP:   Tula Nakayama, MD   Chief Complaint:  Fall  HPI:  70 year old female who  has a past medical history of Anxiety disorder; Hyperlipidemia; Hypertension; Depression; Chronic back pain; Headache; Type 2 diabetes mellitus ([place]); GERD (gastroesophageal reflux disease); Anemia; History of adenomatous polyp of colon; Fatty liver; Sigmoid diverticulosis; Left ureteral stone; History of kidney stones; Arthritis; Hypercalcemia; OSA treated with BiPAP; Nephrolithiasis; Sciatica; Lumbar disc disease with radiculopathy; and Neuropathy, peripheral ([place]). Today presents to the hospital with generalized weakness for more the one-day duration. Patient says that today she fell while trying to do laundry and was too weak to get up. Imaging studies done in the ED including the CT of the head and x-ray of the knees show no significant abnormality. Patient is currently seen by urology for bilateral kidney stone and underwent urologic procedure 2 weeks ago. Patient was put on Cipro for UTI which she has been taking everyday. Also patient complains of vomiting every night for past 1 week. Patient today was supposed to get a renal ultrasound which showed right-sided hydronephrosis with bilateral kidney stones. UA was abnormal and patient started on ceftriaxone. She denies chest pain or shortness of breath. Denies dysuria urgency or frequency of urination. Denies hematuria. No abdominal pain.  Allergies:   Allergies  Allergen Reactions  . Ace Inhibitors Cough  . Keflex [Cephalexin] Diarrhea and Nausea And Vomiting  . Nitrofurantoin Diarrhea and Nausea And Vomiting  . Penicillins Hives, Rash and Other (See Comments)    Blisters on hands and feet Has patient had a PCN reaction causing immediate rash, facial/tongue/throat swelling, SOB or lightheadedness with hypotension: Yes Has patient had a PCN reaction causing severe rash involving mucus membranes or skin necrosis: No Has patient had a PCN reaction that  required hospitalization No Has patient had a PCN reaction occurring within the last 10 years: No If all of the above answers are "NO", then may proceed with Cephalosporin use.       Past Medical History  Diagnosis Date  . Anxiety disorder   . Hyperlipidemia   . Hypertension   . Depression   . Chronic back pain   . Headache   . Type 2 diabetes mellitus ([place])   . GERD (gastroesophageal reflux disease)   . Anemia   . History of adenomatous polyp of colon     2008  . Fatty liver   . Sigmoid diverticulosis   . Left ureteral stone   . History of kidney stones   . Arthritis   . Hypercalcemia   . OSA treated with BiPAP     per study 2007  . Nephrolithiasis   . Sciatica   . Lumbar disc disease with radiculopathy   . Neuropathy, peripheral [facility])     Past Surgical History  Procedure Laterality Date  . Flexible sigmoidoscopy  09/11/2011    [id number] Internal hemorrhoids  . Esophagogastroduodenoscopy (egd) with propofol N/A 03/17/2013    Procedure: ESOPHAGOGASTRODUODENOSCOPY (EGD) WITH PROPOFOL;  Surgeon: Danie Binder, MD;  Location: [facility];  Service: Endoscopy;  Laterality: N/A;  . Savory dilation N/A 03/17/2013    Procedure: SAVORY DILATION;  Surgeon: Danie Binder, MD;  Location: [facility];  Service: Endoscopy;  Laterality: N/A;  #12.8, 14, 15, 16 dilators used  . Biopsy N/A 03/17/2013    Procedure: GASTRIC BIOPSIES;  Surgeon: Danie Binder, MD;  Location: [facility];  Service: Endoscopy;  Laterality: N/A;  . Polypectomy N/A 03/17/2013    Procedure: GASTRIC POLYPECTOMY;  Surgeon: Danie Binder, MD;  Location: [facility];  Service: Endoscopy;  Laterality: N/A;  . Colonoscopy  last one 10/02/2011    MOD [place] [facility], IH, NEXT TCS APR 2018  . Cysto/   right ureterosocopy laser lithotripsy stone extraction  10-13-2004  . Cysto/  right retrograde pyelogram/  placment right ureteral stent  01-10-2010  . Percutaneous nephrostolithotomy Bilateral 12/  2011     [facility]  . Cardiovascular stress  test  01-01-2014    normal lexiscan cardiolite/  no ischemia/ infarct/  normal LV function and wall motion ,  ef 81%  . Transthoracic echocardiogram  01-01-2014    mild LVH/  ef [id number]  grade I diastolic dysfunction/  trivial MR, TR, and PR  . Removal right thigh cyst  2006  . Vaginal hysterectomy  1970's  . Cystoscopy with retrograde pyelogram, ureteroscopy and stent placement Left 10/21/2014    Procedure: CYSTOSCOPY WITH RETROGRADE PYELOGRAM, URETEROSCOPY AND STENT PLACEMENT;  Surgeon: Franchot Gallo, MD;  Location: [facility];  Service: Urology;  Laterality: Left;  . Holmium laser application Left [id number]    Procedure: HOLMIUM LASER APPLICATION;  Surgeon: Franchot Gallo, MD;  Location: [facility];  Service: Urology;  Laterality: Left;  Marland Kitchen Mass excision Left 03/04/2015    Procedure: EXCISION OF SOFT TISSUE NEOPLASM LEFT ARM;  Surgeon: Aviva Signs, MD;  Location: [facility];  Service: General;  Laterality: Left;  . Esophagogastroduodenoscopy (egd) with propofol N/A 06/10/2015    Procedure: ESOPHAGOGASTRODUODENOSCOPY (EGD) WITH PROPOFOL;  Surgeon: Danie Binder, MD;  [facility];  Service: Endoscopy;  Laterality: N/A;  1245 - pt knows to arrive at 8:30  . Savory dilation N/A 06/10/2015    Procedure: SAVORY DILATION;  Surgeon: Danie Binder, MD;  [facility];  Service: Endoscopy;  Laterality: N/A;  . Biopsy  06/10/2015    Procedure: BIOPSY;  Surgeon: Danie Binder, MD;  [facility];  Service: Endoscopy;;  gastric biopsy  . Cardiac catheterization  05-11-2003  dr Shelva Majestic ([facility])    Abnormal cardiolite/   Normal coronary arteries and normal LVF,  ef  63%  . Cystoscopy/retrograde/ureteroscopy/stone extraction with basket Right 06/28/2015    Procedure: CYSTOSCOPY/RETROGRADE/URETEROSCOPY/STONE EXTRACTION WITH BASKET, RIGHT URETERAL DOUBLE J STENT PLACEMENT;  Surgeon: Franchot Gallo, MD;  Location: [facility];  Service: Urology;  Laterality: Right;  . Holmium laser application Right [id number]    Procedure: HOLMIUM LASER APPLICATION;  Surgeon: Franchot Gallo, MD;  [facility];  Service: Urology;  Laterality: Right;  . Cystoscopy/retrograde/ureteroscopy/stone extraction with basket Bilateral 08/02/2015    Procedure: CYSTOSCOPY; BILATERAL RETROGRADE PYELOGRAMS; BILATERAL URETEROSCOPY, STONE EXTRACTION WITH BASKET;  Surgeon: Franchot Gallo, MD;  Location: [facility];  Service: Urology;  Laterality: Bilateral;    Prior to Admission medications   Medication Sig Start Date End Date Taking? Authorizing Provider  ALPRAZolam Duanne Moron) 0.5 MG tablet TAKE 1 TABLET BY MOUTH TWICE A DAY 05/27/15  Yes [name], MD  amLODipine (NORVASC) 10 MG tablet Take 1 tablet (10 mg total) by mouth daily. Patient taking differently: Take 10 mg by mouth every morning.  09/09/14  Yes [name], MD  docusate sodium (COLACE) 100 MG capsule Take 200 mg by mouth at bedtime.    Yes Historical Provider, MD  gabapentin (NEURONTIN) 100 MG capsule TAKE 1 CAPSULE (100 MG TOTAL) BY MOUTH AT BEDTIME. 07/11/15  Yes [name], MD  lovastatin (MEVACOR) 40 MG tablet TAKE  2 TABLETS (80 MG TOTAL) BY MOUTH AT BEDTIME. 06/01/15  Yes [name], MD  metFORMIN (GLUCOPHAGE) 500 MG tablet TAKE 2 TABLETS (1,000 MG TOTAL) BY MOUTH 2 (TWO) TIMES DAILY WITH A MEAL. 09/13/14  Yes [name], MD  pantoprazole (PROTONIX) 40 MG tablet TAKE 1 TABLET BY MOUTH TWICE A DAY 03/16/15  Yes Carlis Stable, NP  PARoxetine (PAXIL) 40 MG tablet TAKE 1 TABLET (40 MG TOTAL) BY MOUTH EVERY MORNING. 07/05/15  Yes [name], MD  potassium citrate (UROCIT-K) 10 MEQ (1080 MG) SR tablet TAKE 1 TABLET 3 TIMES DAILY WITH MEALS. Patient taking differently: Take 10 mEq by mouth 2 (two) times daily.  07/27/14  Yes [name], MD  Vitamin D, Ergocalciferol, (DRISDOL) 50000 UNITS CAPS capsule TAKE 1 CAPSULE (50,000 UNITS TOTAL) BY  MOUTH EVERY 7 (SEVEN) DAYS. Patient taking differently: Take 50,000 Units by mouth every Sunday. TAKE 1 CAPSULE (50,000 UNITS TOTAL) BY MOUTH EVERY 7 (SEVEN) DAYS. 08/30/14  Yes [name], MD  ciprofloxacin (CIPRO) 500 MG tablet Take 1 tablet (500 mg total) by mouth 2 (two) times daily. Reported on 08/06/2015 Patient not taking: Reported on 08/11/2015 08/06/15   Carmin Muskrat, MD  diazepam (VALIUM) 5 MG tablet Take 1 tablet (5 mg total) by mouth every 8 (eight) hours as needed for muscle spasms. Patient not taking: Reported on 08/18/2015 07/14/15   Donne Hazel, MD  HYDROcodone-acetaminophen (NORCO/VICODIN) 5-325 MG tablet Take 1 tablet by mouth every 6 (six) hours as needed for severe pain. Patient taking differently: Take 1 tablet by mouth every 6 (six) hours as needed for moderate pain or severe pain.  08/06/15   Carmin Muskrat, MD  oxybutynin (DITROPAN) 5 MG tablet Take 1 tablet (5 mg total) by mouth every 8 (eight) hours as needed for bladder spasms. Patient not taking: Reported on 08/18/2015 06/28/15   Franchot Gallo, MD    Social History:  reports that she quit smoking about 20 years ago. Her smoking use included Cigarettes. She has a 20 pack-year smoking history. She has never used smokeless tobacco. She reports that she does not drink alcohol or use illicit drugs.  Family History  Problem Relation Age of Onset  . Hypertension Mother   . Diabetes Mother   . Heart failure Mother   . Dementia Mother   . Emphysema Father   . Hypertension Father   . Diabetes Brother   . GER disease Brother   . Cancer      family history   . Diabetes      family history   . Heart defect      famiily history   . Arthritis      family history   . Anesthesia problems Neg Hx   . Hypotension Neg Hx   . Malignant hyperthermia Neg Hx   . Pseudochol deficiency Neg Hx   . Colon cancer Neg Hx   . Hypertension Sister   . Hypertension Sister     Danley Danker Weights   08/18/15 1550  Weight: 83.008 kg  (183 lb)    All the positives are listed in BOLD  Review of Systems:  HEENT: Headache, blurred vision, runny nose, sore throat Neck: Hypothyroidism, hyperthyroidism,,lymphadenopathy Chest : Shortness of breath, history of COPD, Asthma Heart : Chest pain, history of coronary arterey disease GI:  Nausea, vomiting, diarrhea, constipation, GERD GU: Dysuria, urgency, frequency of urination, hematuria Neuro: Stroke, seizures, syncope Psych: Depression, anxiety, hallucinations   Physical Exam: Blood  pressure 137/71, pulse 97, temperature 98.9 F (37.2 C), temperature source Oral, resp. rate 20, height 5\' 6"  (1.676 m), weight 83.008 kg (183 lb), SpO2 96 %. Constitutional:   Patient is a well-developed and well-nourished female in no acute distress and cooperative with exam. Head: Normocephalic and atraumatic Mouth: Mucus membranes moist Eyes: PERRL, EOMI, conjunctivae normal Neck: Supple, No Thyromegaly Cardiovascular: RRR, S1 normal, S2 normal Pulmonary/Chest: CTAB, no wheezes, rales, or rhonchi Abdominal: Soft. Non-tender, non-distended, bowel sounds are normal, no masses, organomegaly, or guarding present.  Neurological: A&O x3, Strength is normal and symmetric bilaterally, cranial nerve II-XII are grossly intact, no focal motor deficit, sensory intact to light touch bilaterally.  Extremities : No Cyanosis, Clubbing or Edema  Labs on Admission:  Basic Metabolic Panel:  Recent Labs Lab 08/18/15 1711  NA 131*  K 4.2  CL 98*  CO2 25  GLUCOSE 119*  BUN 16  CREATININE 1.69*  CALCIUM 9.5  CBC:  Recent Labs Lab 08/18/15 1711  WBC 15.6*  NEUTROABS 12.4*  HGB 8.5*  HCT 27.6*  MCV 73.0*  PLT 534*    Radiological Exams on Admission: Dg Chest 2 View  08/18/2015  CLINICAL DATA:  Fever.  Fall EXAM: CHEST  2 VIEW COMPARISON:  08/06/2015 FINDINGS: Elevated right hemidiaphragm with right lower lobe scarring unchanged from the prior study. Negative for heart failure. No edema or  effusion. Negative for mass or pneumonia. IMPRESSION: No active cardiopulmonary disease. Electronically Signed   By: Franchot Gallo M.D.   On: 08/18/2015 18:49   Ct Head Wo Contrast  08/18/2015  CLINICAL DATA:  Post fall at home hitting posterior aspect of the head. EXAM: CT HEAD WITHOUT CONTRAST TECHNIQUE: Contiguous axial images were obtained from the base of the skull through the vertex without intravenous contrast. COMPARISON:  05/10/2013; brain MRI -11/27/2011 FINDINGS: There is a potential minimal amount of subcutaneous stranding about the high right posterior parietal calvaria (image 32, series 2) without associated displaced calvarial fracture or radiopaque foreign body. Similar findings of centralized atrophy with mild ex vacuo dilatation of the ventricular system. Scattered minimal periventricular hypodensities compatible microvascular ischemic disease. The gray-white differentiation is otherwise well maintained without CT evidence of acute large territory infarct. No intraparenchymal or extra-axial mass or hemorrhage. Unchanged size a configuration of the ventricles and basilar cisterns. No midline shift. Intracranial atherosclerosis. Limited visualization the paranasal sinuses and mastoid air cells is normal. IMPRESSION: 1. Very minimal amount of soft tissue stranding about the high right posterior parietal calvarium without associated displaced calvarial fracture, radiopaque foreign body or acute intracranial process. 2. Similar findings of centralized atrophy and microvascular ischemic disease. Electronically Signed   By: Sandi Mariscal M.D.   On: 08/18/2015 17:30   US Renal  08/18/2015  CLINICAL DATA:  Ureteral calculus. EXAM: RENAL / URINARY TRACT ULTRASOUND COMPLETE COMPARISON:  CT 07/26/2015. FINDINGS: Right Kidney: Length: 13.6 cm. Echogenicity within normal limits. No mass. Prominent right hydronephrosis again noted. Probable right nephrolithiasis. Left Kidney: Length: 13.2 cm. Echogenicity  within normal limits. No mass or hydronephrosis visualized. Probable left nephrolithiasis. Bladder: Appears normal for degree of bladder distention. No ureteral jets identified. Patient refused postvoid exam. IMPRESSION: Prominent right hydronephrosis again noted. Bilateral nephrolithiasis. Electronically Signed   By: Marcello Moores  Register   On: 08/18/2015 11:00   Dg Knee Complete 4 Views Left  08/18/2015  CLINICAL DATA:  Fall.  Bilateral hip and knee pain EXAM: LEFT KNEE - COMPLETE 4+ VIEW COMPARISON:  07/24/2004 FINDINGS: There is no evidence  of fracture, dislocation, or joint effusion. There is no evidence of arthropathy or other focal bone abnormality. Soft tissues are unremarkable. IMPRESSION: Negative. Electronically Signed   By: Franchot Gallo M.D.   On: 08/18/2015 17:01   Dg Knee Complete 4 Views Right  08/18/2015  CLINICAL DATA:  Bilateral hip and bilateral knee pain after falling while taking clothes out of her dryer today. PT states she has had several falls in the past. EXAM: RIGHT KNEE - COMPLETE 4+ VIEW COMPARISON:  None. FINDINGS: There is no evidence of fracture, dislocation, or joint effusion. There is generalized osteopenia. Soft tissues are unremarkable. IMPRESSION: No acute osseous injury of the right knee. Electronically Signed   By: Kathreen Devoid   On: 08/18/2015 17:01   Dg Hips Bilat With Pelvis 3-4 Views  08/18/2015  CLINICAL DATA:  Fall.  Bilateral hip pain EXAM: DG HIP (WITH OR WITHOUT PELVIS) 3-4V BILAT COMPARISON:  08/06/2015 FINDINGS: There is no evidence of hip fracture or dislocation. There is no evidence of arthropathy or other focal bone abnormality. IMPRESSION: Negative. Electronically Signed   By: Franchot Gallo M.D.   On: 08/18/2015 17:07      Assessment/Plan Active Problems:   UTI (lower urinary tract infection)   UTI, complicated Patient presenting with complicated UTI. She was taking Cipro as outpatient. Started on ceftriaxone in the ED. Continue ceftriaxone,  follow urine culture results.  Hydronephrosis, right kidney Renal ultrasound shows right hydronephrosis Will consult urology in a.m. for further evaluation and recommendation  Diabetes mellitus Start sliding scale insulin with NovoLog. Hold metformin  DVT prophylaxis Lovenox  Code status: Full code  Family discussion: No family at bedside   Time Spent on Admission: 60 minutes  [facility] Pager: [phone] 08/18/2015, 9:34 PM  If 7PM-7AM, please contact night-coverage  [email or web address]  Password [email or web address]

## 2015-08-18 NOTE — ED Notes (Signed)
Having knee, abdomen, hips, neck, back of floor, and back.  Rates pain 8/10.  Pt says at end of triage that she fell at home.

## 2015-08-19 ENCOUNTER — Observation Stay (HOSPITAL_COMMUNITY): Payer: Medicare Other

## 2015-08-19 ENCOUNTER — Observation Stay (HOSPITAL_COMMUNITY): Payer: Medicare Other | Admitting: Anesthesiology

## 2015-08-19 ENCOUNTER — Encounter (HOSPITAL_COMMUNITY)
Admission: EM | Disposition: A | Discharge status: Home / Self Care | Payer: Self-pay | Source: Home / Self Care | Attending: Emergency Medicine

## 2015-08-19 ENCOUNTER — Encounter (HOSPITAL_COMMUNITY): Payer: Self-pay | Admitting: Internal Medicine

## 2015-08-19 DIAGNOSIS — E785 Hyperlipidemia, unspecified: Secondary | ICD-10-CM | POA: Diagnosis not present

## 2015-08-19 DIAGNOSIS — N200 Calculus of kidney: Secondary | ICD-10-CM | POA: Diagnosis not present

## 2015-08-19 DIAGNOSIS — R531 Weakness: Secondary | ICD-10-CM

## 2015-08-19 DIAGNOSIS — I679 Cerebrovascular disease, unspecified: Secondary | ICD-10-CM

## 2015-08-19 DIAGNOSIS — N183 Chronic kidney disease, stage 3 (moderate): Secondary | ICD-10-CM | POA: Diagnosis not present

## 2015-08-19 DIAGNOSIS — N1330 Unspecified hydronephrosis: Secondary | ICD-10-CM | POA: Diagnosis not present

## 2015-08-19 DIAGNOSIS — G459 Transient cerebral ischemic attack, unspecified: Secondary | ICD-10-CM

## 2015-08-19 DIAGNOSIS — N132 Hydronephrosis with renal and ureteral calculous obstruction: Secondary | ICD-10-CM | POA: Diagnosis not present

## 2015-08-19 DIAGNOSIS — I10 Essential (primary) hypertension: Secondary | ICD-10-CM | POA: Diagnosis not present

## 2015-08-19 DIAGNOSIS — D72829 Elevated white blood cell count, unspecified: Secondary | ICD-10-CM | POA: Diagnosis not present

## 2015-08-19 DIAGNOSIS — R1031 Right lower quadrant pain: Secondary | ICD-10-CM | POA: Diagnosis not present

## 2015-08-19 DIAGNOSIS — I6789 Other cerebrovascular disease: Secondary | ICD-10-CM

## 2015-08-19 DIAGNOSIS — M25569 Pain in unspecified knee: Secondary | ICD-10-CM | POA: Diagnosis not present

## 2015-08-19 DIAGNOSIS — W19XXXD Unspecified fall, subsequent encounter: Secondary | ICD-10-CM | POA: Diagnosis not present

## 2015-08-19 DIAGNOSIS — W19XXXA Unspecified fall, initial encounter: Secondary | ICD-10-CM | POA: Diagnosis present

## 2015-08-19 DIAGNOSIS — E119 Type 2 diabetes mellitus without complications: Secondary | ICD-10-CM | POA: Diagnosis not present

## 2015-08-19 HISTORY — DX: Other cerebrovascular disease: I67.89

## 2015-08-19 HISTORY — DX: Transient cerebral ischemic attack, unspecified: G45.9

## 2015-08-19 HISTORY — PX: CYSTOSCOPY W/ URETERAL STENT PLACEMENT: SHX1429

## 2015-08-19 LAB — GLUCOSE, CAPILLARY
Glucose-Capillary: 137 mg/dL — ABNORMAL HIGH (ref 65–99)
Glucose-Capillary: 102 mg/dL — ABNORMAL HIGH (ref 65–99)
Glucose-Capillary: 106 mg/dL — ABNORMAL HIGH (ref 65–99)

## 2015-08-19 LAB — CBC
HCT: 27.4 % — ABNORMAL LOW (ref 36.0–46.0)
Hemoglobin: 8.7 g/dL — ABNORMAL LOW (ref 12.0–15.0)
MCH: 23.1 pg — ABNORMAL LOW (ref 26.0–34.0)
MCHC: 31.8 g/dL (ref 30.0–36.0)
MCV: 72.7 fL — ABNORMAL LOW (ref 78.0–100.0)
Platelets: 561 10*3/uL — ABNORMAL HIGH (ref 150–400)
RBC: 3.77 MIL/uL — ABNORMAL LOW (ref 3.87–5.11)
RDW: 18.8 % — ABNORMAL HIGH (ref 11.5–15.5)
WBC: 17.4 10*3/uL — ABNORMAL HIGH (ref 4.0–10.5)

## 2015-08-19 LAB — COMPREHENSIVE METABOLIC PANEL
ALT: 9 U/L — ABNORMAL LOW (ref 14–54)
AST: 17 U/L (ref 15–41)
Albumin: 3 g/dL — ABNORMAL LOW (ref 3.5–5.0)
Alkaline Phosphatase: 57 U/L (ref 38–126)
Anion gap: 11 (ref 5–15)
BUN: 13 mg/dL (ref 6–20)
CO2: 24 mmol/L (ref 22–32)
Calcium: 9.6 mg/dL (ref 8.9–10.3)
Chloride: 102 mmol/L (ref 101–111)
Creatinine, Ser: 1.69 mg/dL — ABNORMAL HIGH (ref 0.44–1.00)
GFR calc Af Amer: 34 mL/min — ABNORMAL LOW (ref >60)
GFR calc non Af Amer: 29 mL/min — ABNORMAL LOW (ref >60)
Glucose, Bld: 110 mg/dL — ABNORMAL HIGH (ref 65–99)
Potassium: 4.2 mmol/L (ref 3.5–5.1)
Sodium: 137 mmol/L (ref 135–145)
Total Bilirubin: 0.3 mg/dL (ref 0.3–1.2)
Total Protein: 8.5 g/dL — ABNORMAL HIGH (ref 6.5–8.1)

## 2015-08-19 LAB — MRSA PCR SCREENING: MRSA by PCR: NEGATIVE

## 2015-08-19 SURGERY — CYSTOSCOPY, WITH RETROGRADE PYELOGRAM AND URETERAL STENT INSERTION
Anesthesia: General | Laterality: Right

## 2015-08-19 MED ORDER — ONDANSETRON HCL 4 MG/2ML IJ SOLN
INTRAMUSCULAR | Status: AC
  Filled 2015-08-19: qty 2

## 2015-08-19 MED ORDER — FENTANYL CITRATE (PF) 100 MCG/2ML IJ SOLN
INTRAMUSCULAR | Status: AC
  Filled 2015-08-19: qty 2

## 2015-08-19 MED ORDER — ALPRAZOLAM 0.5 MG PO TABS
0.5000 mg | ORAL_TABLET | Freq: Two times a day (BID) | ORAL | Status: DC
  Administered 2015-08-19 (×2): 0.5 mg via ORAL
  Filled 2015-08-19 (×2): qty 1

## 2015-08-19 MED ORDER — PRAVASTATIN SODIUM 40 MG PO TABS
80.0000 mg | ORAL_TABLET | Freq: Every day | ORAL | Status: DC
  Administered 2015-08-20 – 2015-08-21 (×2): 80 mg via ORAL
  Filled 2015-08-19 (×3): qty 2

## 2015-08-19 MED ORDER — TAMSULOSIN HCL 0.4 MG PO CAPS
0.4000 mg | ORAL_CAPSULE | Freq: Every day | ORAL | Status: DC
  Administered 2015-08-19: 0.4 mg via ORAL
  Filled 2015-08-19: qty 1

## 2015-08-19 MED ORDER — FENTANYL CITRATE (PF) 100 MCG/2ML IJ SOLN
INTRAMUSCULAR | Status: DC | PRN
  Administered 2015-08-19 (×2): 25 ug via INTRAVENOUS

## 2015-08-19 MED ORDER — MIDAZOLAM HCL 2 MG/2ML IJ SOLN
INTRAMUSCULAR | Status: AC
  Filled 2015-08-19: qty 2

## 2015-08-19 MED ORDER — DEXTROSE 5 % IV SOLN
1.0000 g | INTRAVENOUS | Status: DC
  Administered 2015-08-19 – 2015-08-20 (×2): 1 g via INTRAVENOUS
  Filled 2015-08-19 (×3): qty 10

## 2015-08-19 MED ORDER — AMLODIPINE BESYLATE 5 MG PO TABS
10.0000 mg | ORAL_TABLET | Freq: Every day | ORAL | Status: DC
  Administered 2015-08-19: 10 mg via ORAL
  Filled 2015-08-19: qty 2

## 2015-08-19 MED ORDER — ENOXAPARIN SODIUM 40 MG/0.4ML ~~LOC~~ SOLN
40.0000 mg | Freq: Every day | SUBCUTANEOUS | Status: DC
  Administered 2015-08-20 – 2015-08-22 (×3): 40 mg via SUBCUTANEOUS
  Filled 2015-08-19 (×3): qty 0.4

## 2015-08-19 MED ORDER — GABAPENTIN 100 MG PO CAPS
100.0000 mg | ORAL_CAPSULE | Freq: Every day | ORAL | Status: DC
  Administered 2015-08-19 – 2015-08-21 (×4): 100 mg via ORAL
  Filled 2015-08-19 (×4): qty 1

## 2015-08-19 MED ORDER — EPHEDRINE SULFATE 50 MG/ML IJ SOLN
INTRAMUSCULAR | Status: AC
  Filled 2015-08-19: qty 1

## 2015-08-19 MED ORDER — SODIUM CHLORIDE 0.9 % IJ SOLN
INTRAMUSCULAR | Status: AC
  Filled 2015-08-19: qty 10

## 2015-08-19 MED ORDER — PANTOPRAZOLE SODIUM 40 MG PO TBEC
40.0000 mg | DELAYED_RELEASE_TABLET | Freq: Two times a day (BID) | ORAL | Status: DC
  Administered 2015-08-19 – 2015-08-22 (×8): 40 mg via ORAL
  Filled 2015-08-19 (×8): qty 1

## 2015-08-19 MED ORDER — IOHEXOL 300 MG/ML  SOLN
INTRAMUSCULAR | Status: DC | PRN
  Administered 2015-08-19: 50 mL via INTRAVENOUS

## 2015-08-19 MED ORDER — LACTATED RINGERS IV SOLN
INTRAVENOUS | Status: DC | PRN
  Administered 2015-08-19: 18:00:00 via INTRAVENOUS

## 2015-08-19 MED ORDER — METOCLOPRAMIDE HCL 5 MG/ML IJ SOLN
INTRAMUSCULAR | Status: DC | PRN
  Administered 2015-08-19: 20 mg via INTRAVENOUS

## 2015-08-19 MED ORDER — ONDANSETRON HCL 4 MG/2ML IJ SOLN
4.0000 mg | Freq: Four times a day (QID) | INTRAMUSCULAR | Status: DC | PRN
  Administered 2015-08-19: 4 mg via INTRAVENOUS
  Filled 2015-08-19: qty 2

## 2015-08-19 MED ORDER — SODIUM CHLORIDE 0.9 % IV SOLN
INTRAVENOUS | Status: DC
  Administered 2015-08-19: 01:00:00 via INTRAVENOUS

## 2015-08-19 MED ORDER — DIAZEPAM 5 MG PO TABS
5.0000 mg | ORAL_TABLET | Freq: Three times a day (TID) | ORAL | Status: DC | PRN
  Administered 2015-08-19: 5 mg via ORAL
  Filled 2015-08-19: qty 1

## 2015-08-19 MED ORDER — ONDANSETRON HCL 4 MG PO TABS: 4.0000 mg | ORAL_TABLET | Freq: Four times a day (QID) | ORAL | Status: DC | PRN

## 2015-08-19 MED ORDER — INSULIN ASPART 100 UNIT/ML ~~LOC~~ SOLN
0.0000 [IU] | Freq: Three times a day (TID) | SUBCUTANEOUS | Status: DC
  Administered 2015-08-19: 1 [IU] via SUBCUTANEOUS

## 2015-08-19 MED ORDER — PROPOFOL 10 MG/ML IV BOLUS
INTRAVENOUS | Status: DC | PRN
  Administered 2015-08-19: 110 mg via INTRAVENOUS

## 2015-08-19 MED ORDER — OXYBUTYNIN CHLORIDE 5 MG PO TABS: 5.0000 mg | ORAL_TABLET | Freq: Three times a day (TID) | ORAL | Status: DC | PRN

## 2015-08-19 MED ORDER — PAROXETINE HCL 20 MG PO TABS
40.0000 mg | ORAL_TABLET | Freq: Every day | ORAL | Status: DC
Start: 2015-08-19 — End: 2015-08-22
  Administered 2015-08-19 – 2015-08-22 (×4): 40 mg via ORAL
  Filled 2015-08-19 (×4): qty 2

## 2015-08-19 MED ORDER — LIDOCAINE HCL 1 % IJ SOLN
INTRAMUSCULAR | Status: DC | PRN
  Administered 2015-08-19: 25 mg via INTRADERMAL

## 2015-08-19 MED ORDER — ONDANSETRON HCL 4 MG/2ML IJ SOLN
INTRAMUSCULAR | Status: DC | PRN
  Administered 2015-08-19: 4 mg via INTRAVENOUS

## 2015-08-19 MED ORDER — PROPOFOL 10 MG/ML IV BOLUS
INTRAVENOUS | Status: AC
  Filled 2015-08-19: qty 20

## 2015-08-19 MED ORDER — METOCLOPRAMIDE HCL 5 MG/ML IJ SOLN
INTRAMUSCULAR | Status: AC
  Filled 2015-08-19: qty 4

## 2015-08-19 MED ORDER — LIDOCAINE HCL (PF) 1 % IJ SOLN
INTRAMUSCULAR | Status: AC
  Filled 2015-08-19: qty 5

## 2015-08-19 MED ORDER — MIDAZOLAM HCL 5 MG/5ML IJ SOLN
INTRAMUSCULAR | Status: DC | PRN
  Administered 2015-08-19 (×2): 1 mg via INTRAVENOUS

## 2015-08-19 SURGICAL SUPPLY — 18 items
BAG DRAIN URO TABLE W/ADPT NS (DRAPE) ×2 IMPLANT
BAG DRN 8 ADPR NS SKTRN CSTL (DRAPE) ×1
BAG HAMPER (MISCELLANEOUS) ×2 IMPLANT
CATH INTERMIT  6FR 70CM (CATHETERS) ×2 IMPLANT
CLOTH BEACON ORANGE TIMEOUT ST (SAFETY) ×2 IMPLANT
DECANTER SPIKE VIAL GLASS SM (MISCELLANEOUS) ×2 IMPLANT
GLOVE BIOGEL M 8.0 STRL (GLOVE) ×2 IMPLANT
GOWN STRL REIN XL XLG (GOWN DISPOSABLE) ×2 IMPLANT
GUIDEWIRE STR DUAL SENSOR (WIRE) ×2 IMPLANT
IV NS IRRIG 3000ML ARTHROMATIC (IV SOLUTION) ×2 IMPLANT
KIT ROOM TURNOVER AP CYSTO (KITS) ×2 IMPLANT
MANIFOLD NEPTUNE II (INSTRUMENTS) ×2 IMPLANT
PACK CYSTO (CUSTOM PROCEDURE TRAY) ×2 IMPLANT
PAD ARMBOARD 7.5X6 YLW CONV (MISCELLANEOUS) ×2 IMPLANT
STENT URET 6FRX24 CONTOUR (STENTS) ×1 IMPLANT
TOWEL OR 17X26 4PK STRL BLUE (TOWEL DISPOSABLE) ×2 IMPLANT
WATER STERILE IRR 1000ML POUR (IV SOLUTION) ×2 IMPLANT
WATER STERILE IRR 3000ML UROMA (IV SOLUTION) ×4 IMPLANT

## 2015-08-19 NOTE — Care Management Obs Status (Signed)
[place] NOTIFICATION   Patient Details  Name: Cheryl Abbott MRN: [id number] Date of Birth: [birth year]   Medicare Observation Status Notification Given:  Yes    Alvie Heidelberg, RN 08/19/2015, 8:23 AM

## 2015-08-19 NOTE — Transfer of Care (Signed)
Immediate Anesthesia Transfer of Care Note  Patient: Cheryl Abbott  Procedure(s) Performed: Procedure(s): CYSTOSCOPY WITH RETROGRADE PYELOGRAM/URETERAL STENT PLACEMENT (Right)  Patient Location: PACU  Anesthesia Type:General  Level of Consciousness: awake  Airway & Oxygen Therapy: Patient Spontanous Breathing and Patient connected to face mask oxygen  Post-op Assessment: Report given to RN and Post -op Vital signs reviewed and stable  Post vital signs: Reviewed and stable  Last Vitals:  Filed Vitals:   08/19/15 1025 08/19/15 1500  BP: 105/55 118/60  Pulse:  112  Temp:  38.6 C  Resp:  21    Complications: No apparent anesthesia complications

## 2015-08-19 NOTE — Anesthesia Preprocedure Evaluation (Addendum)
Anesthesia Evaluation  Patient identified by MRN, date of birth, ID band Patient awake    Reviewed: Allergy & Precautions, NPO status , Patient's Chart, lab work & pertinent test results, reviewed documented beta blocker date and time   Airway Mallampati: III  TM Distance: >3 FB     Dental  (+) Teeth Intact   Pulmonary former smoker,    breath sounds clear to auscultation       Cardiovascular Exercise Tolerance: Good hypertension, Pt. on medications  Rhythm:Regular Rate:Normal     Neuro/Psych Anxiety Depression    GI/Hepatic GERD  Controlled,  Endo/Other  diabetes, Well Controlled, Type 2, Oral Hypoglycemic Agents  Renal/GU Hx: renal calculi     Musculoskeletal   Abdominal (+)  Abdomen: soft. Bowel sounds: normal.  Peds  Hematology   Anesthesia Other Findings   Reproductive/Obstetrics                           Anesthesia Physical Anesthesia Plan  ASA: III and emergent  Anesthesia Plan: General   Post-op Pain Management:    Induction: Intravenous  Airway Management Planned: LMA  Additional Equipment:   Intra-op Plan:   Post-operative Plan: Extubation in OR  Informed Consent:   Plan Discussed with: CRNA, Anesthesiologist and Surgeon  Anesthesia Plan Comments:         Anesthesia Quick Evaluation

## 2015-08-19 NOTE — Progress Notes (Signed)
Progress Note   Cheryl Abbott [id number] DOB: 08-Jan-1946 DOA: 08/18/2015 PCP: Tula Nakayama, MD   Brief Narrative:   Cheryl Abbott is an 70 y.o. female with a PMH of hypertension, stage III chronic kidney disease, hyperlipidemia, depression/anxiety, type 2 diabetes, OSA on BiPAP and peripheral neuropathy who was admitted 08/18/15 with a chief complaint of generalized weakness resulting in a fall. She also reported having undergone a urologic procedure 2 weeks ago and has been maintained on Cipro for UTI. She has had vomiting for the past week. Upon initial evaluation in the ED, renal ultrasound showed prominent right-sided hydronephrosis and bilateral nephrolithiasis. Plain films of the bilateral hips and knees were without fracture. CT of the head showed a soft tissue swelling in the right parietal area with no associated fracture. Urine microscopy showed too numerous to count WBCs and many bacteria but was a contaminated specimen.  Assessment/Plan:   Principal Problem:   Fall/generalized weakness secondary to possible complicated UTI - Physical therapy evaluation requested. Too weak to ambulate.  Almost fell when up, with 2 nurses assisting her. - Urinalysis findings concerning for infection, but specimen was contaminated. Continue Rocephin for now. - Follow-up urine cultures.  Active Problems:   Type 2 diabetes with nephropathy (HCC) - Metformin currently on hold. Add insulin sensitive SSI.    Hypertension goal BP (blood pressure) < 130/80 - Continue Norvasc.    RENAL CALCULUS/right-sided hydronephrosis - Chronic problem. - Urology consultation requested.    Obesity (BMI 30.0-34.9) - Heart healthy diet.    CKD (chronic kidney disease) stage 3, GFR 30-59 ml/min - Baseline creatinine appears to be about 1-1.25. Creatinine has risen since 07/20/15. - Patient is allergic to ace inhibitors.  - CKD likely from hypertension/DM with acute worsening secondary to obstructive  uropathy.    Cerebral microvascular disease - Patient should likely be on aspirin therapy. Will await urology evaluation in case instrumentation needed.    DVT Prophylaxis - SCDs ordered.   Family Communication/Anticipated D/C date and plan/Code Status   Family Communication: Daughter at the bedside. Disposition Plan/date: Suspect will need SNF, too weak to live alone. Code Status: Full code.   IV Access:    Peripheral IV   Procedures and diagnostic studies:   Dg Chest 2 View  08/18/2015  CLINICAL DATA:  Fever.  Fall EXAM: CHEST  2 VIEW COMPARISON:  08/06/2015 FINDINGS: Elevated right hemidiaphragm with right lower lobe scarring unchanged from the prior study. Negative for heart failure. No edema or effusion. Negative for mass or pneumonia. IMPRESSION: No active cardiopulmonary disease. Electronically Signed   By: Franchot Gallo M.D.   On: 08/18/2015 18:49   Ct Head Wo Contrast  08/18/2015  CLINICAL DATA:  Post fall at home hitting posterior aspect of the head. EXAM: CT HEAD WITHOUT CONTRAST TECHNIQUE: Contiguous axial images were obtained from the base of the skull through the vertex without intravenous contrast. COMPARISON:  05/10/2013; brain MRI -11/27/2011 FINDINGS: There is a potential minimal amount of subcutaneous stranding about the high right posterior parietal calvaria (image 32, series 2) without associated displaced calvarial fracture or radiopaque foreign body. Similar findings of centralized atrophy with mild ex vacuo dilatation of the ventricular system. Scattered minimal periventricular hypodensities compatible microvascular ischemic disease. The gray-white differentiation is otherwise well maintained without CT evidence of acute large territory infarct. No intraparenchymal or extra-axial mass or hemorrhage. Unchanged size a configuration of the ventricles and basilar cisterns. No midline shift. Intracranial atherosclerosis. Limited visualization the paranasal  sinuses and  mastoid air cells is normal. IMPRESSION: 1. Very minimal amount of soft tissue stranding about the high right posterior parietal calvarium without associated displaced calvarial fracture, radiopaque foreign body or acute intracranial process. 2. Similar findings of centralized atrophy and microvascular ischemic disease. Electronically Signed   By: Sandi Mariscal M.D.   On: 08/18/2015 17:30   US Renal  08/18/2015  CLINICAL DATA:  Ureteral calculus. EXAM: RENAL / URINARY TRACT ULTRASOUND COMPLETE COMPARISON:  CT 07/26/2015. FINDINGS: Right Kidney: Length: 13.6 cm. Echogenicity within normal limits. No mass. Prominent right hydronephrosis again noted. Probable right nephrolithiasis. Left Kidney: Length: 13.2 cm. Echogenicity within normal limits. No mass or hydronephrosis visualized. Probable left nephrolithiasis. Bladder: Appears normal for degree of bladder distention. No ureteral jets identified. Patient refused postvoid exam. IMPRESSION: Prominent right hydronephrosis again noted. Bilateral nephrolithiasis. Electronically Signed   By: Marcello Moores  Register   On: 08/18/2015 11:00   Dg Knee Complete 4 Views Left  08/18/2015  CLINICAL DATA:  Fall.  Bilateral hip and knee pain EXAM: LEFT KNEE - COMPLETE 4+ VIEW COMPARISON:  07/24/2004 FINDINGS: There is no evidence of fracture, dislocation, or joint effusion. There is no evidence of arthropathy or other focal bone abnormality. Soft tissues are unremarkable. IMPRESSION: Negative. Electronically Signed   By: Franchot Gallo M.D.   On: 08/18/2015 17:01   Dg Knee Complete 4 Views Right  08/18/2015  CLINICAL DATA:  Bilateral hip and bilateral knee pain after falling while taking clothes out of her dryer today. PT states she has had several falls in the past. EXAM: RIGHT KNEE - COMPLETE 4+ VIEW COMPARISON:  None. FINDINGS: There is no evidence of fracture, dislocation, or joint effusion. There is generalized osteopenia. Soft tissues are unremarkable. IMPRESSION: No acute  osseous injury of the right knee. Electronically Signed   By: Kathreen Devoid   On: 08/18/2015 17:01   Dg Hips Bilat With Pelvis 3-4 Views  08/18/2015  CLINICAL DATA:  Fall.  Bilateral hip pain EXAM: DG HIP (WITH OR WITHOUT PELVIS) 3-4V BILAT COMPARISON:  08/06/2015 FINDINGS: There is no evidence of hip fracture or dislocation. There is no evidence of arthropathy or other focal bone abnormality. IMPRESSION: Negative. Electronically Signed   By: Franchot Gallo M.D.   On: 08/18/2015 17:07     Medical Consultants:    Urology  Anti-Infectives:    Rocephin 08/18/15--->  Subjective:    Cheryl Abbott  is very weak. I observed her attempting to ambulate to the bathroom with 2 assistants, and her knees buckled and almost gave out. She reports lower abdominal discomfort. Still running fevers.   Objective:    Filed Vitals:   08/18/15 2006 08/19/15 0056 08/19/15 0457 08/19/15 0554  BP: 137/71 113/60 108/56   Pulse: 97 108 110   Temp: 98.9 F (37.2 C) 100.3 F (37.9 C) 100.9 F (38.3 C) 99.1 F (37.3 C)  TempSrc: Oral Oral Oral Oral  Resp: 20 20 20    Height:      Weight:  82.827 kg (182 lb 9.6 oz)    SpO2: 96% 91% 92%     Intake/Output Summary (Last 24 hours) at 08/19/15 0844 Last data filed at 08/19/15 0500  Gross per 24 hour  Intake  37.67 ml  Output      1 ml  Net  36.67 ml   Filed Weights   08/18/15 1550 08/19/15 0056  Weight: 83.008 kg (183 lb) 82.827 kg (182 lb 9.6 oz)    Exam: Gen:  NAD, weak  Cardiovascular:  RRR, No M/R/G Respiratory:  Lungs CTAB Gastrointestinal:  Abdomen soft, NT/ND, + BS Extremities:  No C/E/C   Data Reviewed:    Labs: Basic Metabolic Panel:  Recent Labs Lab 08/18/15 1711 08/19/15 0718  NA 131* 137  K 4.2 4.2  CL 98* 102  CO2 25 24  GLUCOSE 119* 110*  BUN 16 13  CREATININE 1.69* 1.69*  CALCIUM 9.5 9.6   GFR Estimated Creatinine Clearance: 33.1 mL/min (by C-G formula based on Cr of 1.69). Liver Function Tests:  Recent  Labs Lab 08/19/15 0718  AST 17  ALT 9*  ALKPHOS 57  BILITOT 0.3  PROT 8.5*  ALBUMIN 3.0*   CBC:  Recent Labs Lab 08/18/15 1711 08/19/15 0718  WBC 15.6* 17.4*  NEUTROABS 12.4*  --   HGB 8.5* 8.7*  HCT 27.6* 27.4*  MCV 73.0* 72.7*  PLT 534* 561*   Sepsis Labs:  Recent Labs Lab 08/18/15 1711 08/18/15 2204 08/19/15 0718  WBC 15.6*  --  17.4*  LATICACIDVEN 1.3 1.3  --    Microbiology No results found for this or any previous visit (from the past 240 hour(s)).   Medications:   . ALPRAZolam  0.5 mg Oral BID  . amLODipine  10 mg Oral Daily  . enoxaparin (LOVENOX) injection  40 mg Subcutaneous Q0600  . gabapentin  100 mg Oral QHS  . pantoprazole  40 mg Oral BID  . PARoxetine  40 mg Oral Daily  . pravastatin  80 mg Oral q1800   Continuous Infusions: . sodium chloride 10 mL/hr at 08/19/15 0500    Time spent: 25 minutes.     [facility] Pager [phone]. If unable to reach me by pager, please call my cell phone at [phone].  *Please refer to [email or web address], password [email or web address] to get updated schedule on who will round on this patient, as hospitalists switch teams weekly. If 7PM-7AM, please contact night-coverage at [email or web address], password [email or web address] for any overnight needs.  08/19/2015, 8:44 AM

## 2015-08-19 NOTE — Anesthesia Procedure Notes (Signed)
Procedure Name: LMA Insertion Date/Time: 08/19/2015 7:11 PM Performed by: Charmaine Downs Pre-anesthesia Checklist: Patient identified, Emergency Drugs available, Suction available and Patient being monitored Patient Re-evaluated:Patient Re-evaluated prior to inductionOxygen Delivery Method: Circle system utilized Preoxygenation: Pre-oxygenation with 100% oxygen Intubation Type: IV induction LMA: LMA inserted LMA Size: 4.0 Grade View: Grade I Number of attempts: 1 Placement Confirmation: breath sounds checked- equal and bilateral and positive ETCO2 Tube secured with: Tape Dental Injury: Teeth and Oropharynx as per pre-operative assessment

## 2015-08-19 NOTE — Anesthesia Postprocedure Evaluation (Signed)
Anesthesia Post Note  Patient: Cheryl Abbott  Procedure(s) Performed: Procedure(s) (LRB): CYSTOSCOPY WITH RETROGRADE PYELOGRAM/URETERAL STENT PLACEMENT (Right)  Patient location during evaluation: PACU Anesthesia Type: General Level of consciousness: awake and patient cooperative Pain management: pain level controlled Vital Signs Assessment: post-procedure vital signs reviewed and stable Respiratory status: spontaneous breathing, patient connected to face mask oxygen and nonlabored ventilation Cardiovascular status: blood pressure returned to baseline and stable Postop Assessment: no signs of nausea or vomiting Anesthetic complications: no    Last Vitals:  Filed Vitals:   08/19/15 1937 08/19/15 1945  BP:  110/71  Pulse: 100 99  Temp: 37 C   Resp: 24 27    Last Pain:  Filed Vitals:   08/19/15 1954  PainSc: 3                  Farheen Pfahler J

## 2015-08-19 NOTE — Consult Note (Signed)
Urology Consult  Referring physician: Dr. Rockne Menghini Reason for referral: leukocytosis, right hydronephrosis  Chief Complaint: right flank pain  History of Present Illness: Cheryl Abbott is a 70yo with a hx of nephrolithiasis who was admitted after a fall at home. She has been having sharp, intermittent, severe nonradiating right flank pain for 2 weeks. She has been having low grade fevers at home. Temp is 100.9 here. She has associated nausea but no vomiting. She has associated fatigue and chills. WBC count is 17.5. She underwent renal US which showed moderate right hydronephrois. She then had a CT scan which showed severe right hydronephrosis to the level of the mid ureter. She has a 8m mid ureteral calculus. Urine shows contamination  Past Medical History  Diagnosis Date  . Anxiety disorder   . Hyperlipidemia   . Hypertension   . Depression   . Chronic back pain   . Headache   . Type 2 diabetes mellitus ([place]   . GERD (gastroesophageal reflux disease)   . Anemia   . History of adenomatous polyp of colon     2008  . Fatty liver   . Sigmoid diverticulosis   . Left ureteral stone   . History of kidney stones   . Arthritis   . Hypercalcemia   . OSA treated with BiPAP     per study 2007  . Nephrolithiasis   . Sciatica   . Lumbar disc disease with radiculopathy   . Neuropathy, peripheral ([place]   . 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 Flexible sigmoidoscopy  09/11/2011    SWRU:[id number] hemorrhoids  . Esophagogastroduodenoscopy (egd) with propofol N/A 03/17/2013    Procedure: ESOPHAGOGASTRODUODENOSCOPY (EGD) WITH PROPOFOL;  Surgeon: SDanie Binder MD;  Location: [facility];  Service: Endoscopy;  Laterality: N/A;  . Savory dilation N/A 03/17/2013    Procedure: SAVORY DILATION;  Surgeon: SDanie Binder MD;  Location: [facility];  Service: Endoscopy;  Laterality: N/A;  #12.8, 14, 15, 16 dilators used  . Biopsy N/A 03/17/2013     Procedure: GASTRIC BIOPSIES;  Surgeon: SDanie Binder MD;  Location: [facility];  Service: Endoscopy;  Laterality: N/A;  . Polypectomy N/A 03/17/2013    Procedure: GASTRIC POLYPECTOMY;  Surgeon: SDanie Binder MD;  Location: [facility];  Service: Endoscopy;  Laterality: N/A;  . Colonoscopy  last one 10/02/2011    [facility] [place] TICS, IH, NEXT TCS APR 2018  . Cysto/   right ureterosocopy laser lithotripsy stone extraction  10-13-2004  . Cysto/  right retrograde pyelogram/  placment right ureteral stent  01-10-2010  . Percutaneous nephrostolithotomy Bilateral 12/  2011     [facility]  . Cardiovascular stress test  01-01-2014    normal lexiscan cardiolite/  no ischemia/ infarct/  normal LV function and wall motion ,  ef 81%  . Transthoracic echocardiogram  01-01-2014    mild LVH/  ef 619-14%/ grade I diastolic dysfunction/  trivial MR, TR, and PR  . Removal right thigh cyst  2006  . Vaginal hysterectomy  1970's  . Cystoscopy with retrograde pyelogram, ureteroscopy and stent placement Left 10/21/2014    Procedure: CYSTOSCOPY WITH RETROGRADE PYELOGRAM, URETEROSCOPY AND STENT PLACEMENT;  Surgeon: SFranchot Gallo MD;  [facility]  Service: Urology;  Laterality: Left;  . Holmium laser application Left 57/82/9562   Procedure: HOLMIUM LASER APPLICATION;  Surgeon: SFranchot Gallo MD;  [facility]  [facility];  Laterality:  Left;  . Mass excision Left 03/04/2015    Procedure: EXCISION OF SOFT TISSUE NEOPLASM LEFT ARM;  Surgeon: Aviva Signs, MD;  [facility]: [facility];  Service: General;  Laterality: Left;  . Esophagogastroduodenoscopy (egd) with propofol N/A 06/10/2015    Procedure: ESOPHAGOGASTRODUODENOSCOPY (EGD) WITH PROPOFOL;  Surgeon: Danie Binder, MD;  [facility];  Service: Endoscopy;  Laterality: N/A;  1245 - pt knows to arrive at 8:30  . Savory dilation N/A 06/10/2015    Procedure: SAVORY DILATION;  Surgeon: Danie Binder, MD;  [facility];  Service: Endoscopy;  Laterality: N/A;  . Biopsy  06/10/2015    Procedure: BIOPSY;  Surgeon: Danie Binder, MD;  [facility];  Service: Endoscopy;;  gastric biopsy  . Cardiac catheterization  05-11-2003  dr Shelva Majestic ([facility])    Abnormal cardiolite/   Normal coronary arteries and normal LVF,  ef  63%  . Cystoscopy/retrograde/ureteroscopy/stone extraction with basket Right 06/28/2015    Procedure: CYSTOSCOPY/RETROGRADE/URETEROSCOPY/STONE EXTRACTION WITH BASKET, RIGHT URETERAL DOUBLE J STENT PLACEMENT;  Surgeon: Franchot Gallo, MD;  [facility];  Service: Urology;  Laterality: Right;  . Holmium laser application Right 1/91/4782    Procedure: HOLMIUM LASER APPLICATION;  Surgeon: Franchot Gallo, MD;  [facility];  Service: Urology;  Laterality: Right;  . Cystoscopy/retrograde/ureteroscopy/stone extraction with basket Bilateral 08/02/2015    Procedure: CYSTOSCOPY; BILATERAL RETROGRADE PYELOGRAMS; BILATERAL URETEROSCOPY, STONE EXTRACTION WITH BASKET;  Surgeon: Franchot Gallo, MD;  Location[facility];  Service: Urology;  Laterality: Bilateral;    Medications: I have reviewed the patient's current medications. Allergies:  Allergies  Allergen Reactions  . Ace Inhibitors Cough  . Keflex [Cephalexin] Diarrhea and Nausea And Vomiting  . Nitrofurantoin Diarrhea and Nausea And Vomiting  . Penicillins Hives, Rash and Other (See Comments)    Blisters on hands and feet Has patient had a PCN reaction causing immediate rash, facial/tongue/throat swelling, SOB or lightheadedness with hypotension: Yes Has patient had a PCN reaction causing severe rash involving mucus membranes or skin necrosis: No Has patient had a PCN reaction that required hospitalization No Has patient had a PCN reaction occurring within the last 10 years: No If all of the above answers are "NO", then may proceed with Cephalosporin use.     Family History  Problem Relation Age  of Onset  . Hypertension Mother   . Diabetes Mother   . Heart failure Mother   . Dementia Mother   . Emphysema Father   . Hypertension Father   . Diabetes Brother   . GER disease Brother   . Cancer      family history   . Diabetes      family history   . Heart defect      famiily history   . Arthritis      family history   . Anesthesia problems Neg Hx   . Hypotension Neg Hx   . Malignant hyperthermia Neg Hx   . Pseudochol deficiency Neg Hx   . Colon cancer Neg Hx   . Hypertension Sister   . Hypertension Sister    Social History:  reports that she quit smoking about 20 years ago. Her smoking use included Cigarettes. She has a 20 pack-year smoking history. She has never used smokeless tobacco. She reports that she does not drink alcohol or use illicit drugs.  Review of Systems  Constitutional: Positive for fever, chills and malaise/fatigue.  Gastrointestinal: Positive for nausea.  Genitourinary: Positive for  dysuria, urgency, frequency and flank pain.  All other systems reviewed and are negative.   Physical Exam:  Vital signs in last 24 hours: Temp:  [98.9 F (37.2 C)-100.9 F (38.3 C)] 99.1 F (37.3 C) (03/10 0554) Pulse Rate:  [97-110] 110 (03/10 0457) Resp:  [20] 20 (03/10 0457) BP: (105-154)/(55-74) 105/55 mmHg (03/10 1025) SpO2:  [91 %-98 %] 92 % (03/10 0457) Weight:  [82.827 kg (182 lb 9.6 oz)-83.008 kg (183 lb)] 82.827 kg (182 lb 9.6 oz) (03/10 0056) Physical Exam  Constitutional: She is oriented to person, place, and time. She appears well-developed and well-nourished.  HENT:  Head: Normocephalic and atraumatic.  Eyes: EOM are normal. Pupils are equal, round, and reactive to light.  Neck: Normal range of motion. No thyromegaly present.  Cardiovascular: Normal rate and regular rhythm.   Respiratory: Effort normal. No respiratory distress.  GI: Soft. She exhibits no distension and no mass. There is no tenderness. There is no rebound and no guarding.   Musculoskeletal: Normal range of motion.  Neurological: She is alert and oriented to person, place, and time.  Skin: Skin is warm and dry.  Psychiatric: She has a normal mood and affect. Her behavior is normal. Judgment and thought content normal.    Laboratory Data:  Results for orders placed or performed during the hospital encounter of 08/18/15 (from the past 72 hour(s))  Basic metabolic panel     Status: Abnormal   Collection Time: 08/18/15  5:11 PM  Result Value Ref Range   Sodium 131 (L) 135 - 145 mmol/L   Potassium 4.2 3.5 - 5.1 mmol/L   Chloride 98 (L) 101 - 111 mmol/L   CO2 25 22 - 32 mmol/L   Glucose, Bld 119 (H) 65 - 99 mg/dL   BUN 16 6 - 20 mg/dL   Creatinine, Ser 1.69 (H) 0.44 - 1.00 mg/dL   Calcium 9.5 8.9 - 10.3 mg/dL   GFR calc non Af Amer 29 (L) >60 mL/min   GFR calc Af Amer 34 (L) >60 mL/min    Comment: (NOTE) The eGFR has been calculated using the CKD EPI equation. This calculation has not been validated in all clinical situations. eGFR's persistently <60 mL/min signify possible Chronic Kidney Disease.    Anion gap 8 5 - 15  CBC with Differential     Status: Abnormal   Collection Time: 08/18/15  5:11 PM  Result Value Ref Range   WBC 15.6 (H) 4.0 - 10.5 K/uL   RBC 3.78 (L) 3.87 - 5.11 MIL/uL   Hemoglobin 8.5 (L) 12.0 - 15.0 g/dL   HCT 27.6 (L) 36.0 - 46.0 %   MCV 73.0 (L) 78.0 - 100.0 fL   MCH 22.5 (L) 26.0 - 34.0 pg   MCHC 30.8 30.0 - 36.0 g/dL   RDW 18.9 (H) 11.5 - 15.5 %   Platelets 534 (H) 150 - 400 K/uL   Neutrophils Relative % 80 %   Lymphocytes Relative 10 %   Monocytes Relative 9 %   Eosinophils Relative 1 %   Basophils Relative 0 %   Neutro Abs 12.4 (H) 1.7 - 7.7 K/uL   Lymphs Abs 1.6 0.7 - 4.0 K/uL   Monocytes Absolute 1.4 (H) 0.1 - 1.0 K/uL   Eosinophils Absolute 0.2 0.0 - 0.7 K/uL   Basophils Absolute 0.0 0.0 - 0.1 K/uL   RBC Morphology POLYCHROMASIA PRESENT     Comment: TARGET CELLS  Lactic acid, plasma     Status: None  Collection Time: 08/18/15  5:11 PM  Result Value Ref Range   Lactic Acid, Venous 1.3 0.5 - 2.0 mmol/L  Urinalysis, Routine w reflex microscopic     Status: Abnormal   Collection Time: 08/18/15  6:00 PM  Result Value Ref Range   Color, Urine STRAW (A) YELLOW   APPearance CLEAR CLEAR   Specific Gravity, Urine <1.005 (L) 1.005 - 1.030   pH 5.5 5.0 - 8.0   Glucose, UA NEGATIVE NEGATIVE mg/dL   Hgb urine dipstick SMALL (A) NEGATIVE   Bilirubin Urine NEGATIVE NEGATIVE   Ketones, ur NEGATIVE NEGATIVE mg/dL   Protein, ur TRACE (A) NEGATIVE mg/dL   Nitrite NEGATIVE NEGATIVE   Leukocytes, UA LARGE (A) NEGATIVE  Urine microscopic-add on     Status: Abnormal   Collection Time: 08/18/15  6:00 PM  Result Value Ref Range   Squamous Epithelial / LPF TOO NUMEROUS TO COUNT (A) NONE SEEN   WBC, UA TOO NUMEROUS TO COUNT 0 - 5 WBC/hpf   RBC / HPF 0-5 0 - 5 RBC/hpf   Bacteria, UA MANY (A) NONE SEEN  Lactic acid, plasma     Status: None   Collection Time: 08/18/15 10:04 PM  Result Value Ref Range   Lactic Acid, Venous 1.3 0.5 - 2.0 mmol/L  CBC     Status: Abnormal   Collection Time: 08/19/15  7:18 AM  Result Value Ref Range   WBC 17.4 (H) 4.0 - 10.5 K/uL   RBC 3.77 (L) 3.87 - 5.11 MIL/uL   Hemoglobin 8.7 (L) 12.0 - 15.0 g/dL   HCT 27.4 (L) 36.0 - 46.0 %   MCV 72.7 (L) 78.0 - 100.0 fL   MCH 23.1 (L) 26.0 - 34.0 pg   MCHC 31.8 30.0 - 36.0 g/dL   RDW 18.8 (H) 11.5 - 15.5 %   Platelets 561 (H) 150 - 400 K/uL  Comprehensive metabolic panel     Status: Abnormal   Collection Time: 08/19/15  7:18 AM  Result Value Ref Range   Sodium 137 135 - 145 mmol/L   Potassium 4.2 3.5 - 5.1 mmol/L   Chloride 102 101 - 111 mmol/L   CO2 24 22 - 32 mmol/L   Glucose, Bld 110 (H) 65 - 99 mg/dL   BUN 13 6 - 20 mg/dL   Creatinine, Ser 1.69 (H) 0.44 - 1.00 mg/dL   Calcium 9.6 8.9 - 10.3 mg/dL   Total Protein 8.5 (H) 6.5 - 8.1 g/dL   Albumin 3.0 (L) 3.5 - 5.0 g/dL   AST 17 15 - 41 U/L   ALT 9 (L) 14 - 54 U/L    Alkaline Phosphatase 57 38 - 126 U/L   Total Bilirubin 0.3 0.3 - 1.2 mg/dL   GFR calc non Af Amer 29 (L) >60 mL/min   GFR calc Af Amer 34 (L) >60 mL/min    Comment: (NOTE) The eGFR has been calculated using the CKD EPI equation. This calculation has not been validated in all clinical situations. eGFR's persistently <60 mL/min signify possible Chronic Kidney Disease.    Anion gap 11 5 - 15  Glucose, capillary     Status: Abnormal   Collection Time: 08/19/15  1:12 PM  Result Value Ref Range   Glucose-Capillary 137 (H) 65 - 99 mg/dL   Comment 1 Notify RN    Comment 2 Document in Chart    No results found for this or any previous visit (from the past 240 hour(s)). Creatinine:  Recent Labs  08/18/15  1711 08/19/15 0718  CREATININE 1.69* 1.69*   Baseline Creatinine: 1.5  Impression/Assessment:  70yo with right ureteral calculus, severe hydronephrosis, leukocytosis, SIRS  Plan:  1. NPO  2. The risks/benefits/alternatives to right ureteral stent placement was explained to the patient and she understands and wishes to proceed with surgery. Patient will be taken to OR tonight for stent placement.  Kiev Labrosse L 08/19/2015, 1:27 PM

## 2015-08-19 NOTE — Progress Notes (Signed)
Pharmacy Antibiotic Note  Cheryl Abbott is a 70 y.o. female admitted on 08/18/2015 with UTI.  Pharmacy has been consulted for rocephin dosing.  Plan: Rocephin 1gm IV every 24 hours   Height: 5\' 6"  (167.6 cm) Weight: 182 lb 9.6 oz (82.827 kg) IBW/kg (Calculated) : 59.3  Temp (24hrs), Avg:99.7 F (37.6 C), Min:98.9 F (37.2 C), Max:100.9 F (38.3 C)   Recent Labs Lab 08/18/15 1711 08/18/15 2204 08/19/15 0718  WBC 15.6*  --  17.4*  CREATININE 1.69*  --  1.69*  LATICACIDVEN 1.3 1.3  --     Estimated Creatinine Clearance: 33.1 mL/min (by C-G formula based on Cr of 1.69).    Allergies  Allergen Reactions  . Ace Inhibitors Cough  . Keflex [Cephalexin] Diarrhea and Nausea And Vomiting  . Nitrofurantoin Diarrhea and Nausea And Vomiting  . Penicillins Hives, Rash and Other (See Comments)    Blisters on hands and feet Has patient had a PCN reaction causing immediate rash, facial/tongue/throat swelling, SOB or lightheadedness with hypotension: Yes Has patient had a PCN reaction causing severe rash involving mucus membranes or skin necrosis: No Has patient had a PCN reaction that required hospitalization No Has patient had a PCN reaction occurring within the last 10 years: No If all of the above answers are "NO", then may proceed with Cephalosporin use.     Antimicrobials this admission: Rocephin 3/09>>  Microbiology results: 3/09 Urine cx pending  Thank you for allowing pharmacy to be a part of this patient's care. Will sign off for now. Pharmacokinetic monitoring not necessary for rocephin  Isac Sarna, BS Vena [place], BCPS Clinical Pharmacist Pager [phone] 08/19/2015 9:53 AM

## 2015-08-19 NOTE — Progress Notes (Signed)
Pt sedated, but I discussed cystoscopy and stent placement with her. We will proceed with right J2 stent placement.

## 2015-08-19 NOTE — Op Note (Signed)
Preoperative diagnosis: Right hydronephrosis with impacted right distal ureteral stone  Postoperative diagnosis: Same  Principal procedure: Cystoscopy, right retrograde ureteropyelogram, interpretive fluoroscopy, double-J stent placement-6 French by 24 cm without tether  Surgeon: Cephus Tupy  Anesthesia: Gen. with LMA  Complications: None  Drains: 24 cm x 6 French contour double-J stent without tether  Specimens: None  Indications: 70 year old female with an extensive recent history of urolithiasis. She is had 2 prior procedures, ureteroscopically, for a large stone burden. Her most recent procedure was a couple of weeks ago, removing relatively asymptomatic left distal ureteral stone as well as midureteral stones on the right. This procedure was performed by me. Ureteroscopically, she had no more ureteral stones. However, she was recently admitted with fever, infected urine, CTscan revealing right hydronephrosis with a right distal ureteral stone. She presents at this time for cystoscopy and double-J stent placement. Dr. Nicolette Bang admitted the patient earlier today and discussed the procedure with her. I met the patient in the holding area, she was sedated, and though I explained the procedure to her, she did not seem to understand.  Description of procedure: The patient was identified and properly marked in the holding area. She had been receiving preoperative IV Rocephin. She was taken the operating room where general anesthetic was administered using the LMA. She was placed in the dorsolithotomy position. Genitalia and perineum were prepped and draped. Timeout was then performed.  A 23 French panendoscope was placed and were bladder. Inspection of the bladder revealed normal findings. The right ureteral orifice was cannulated with a 6 [place] open-ended catheter. Omnipaque was used to perform a retrograde ureteropyelogram. This revealed the distal 3-4 cm of the ureter eating normal in  caliber. There was a significant stricture just proximal to this with narrowing and a filling defect. I was unable to pass an adequate amount of contrast past the stricture.  At this point, I then negotiated at 0.038 inch sensor-tip guidewire through the open-ended catheter, easily into the proximal ureter, past the stricture. There was a curl in the upper pole calyx. I then negotiated the open-ended catheter over top of the guidewire, proximal to the stricture. The guidewire was removed and a hydronephrotic drip was obtained. I then replaced the sensor-tip guidewire up in the upper pole calyx. The open-ended catheter was then removed. Using cystoscopic guidance, a 24 cm, 6 French contour double-J stent was negotiated over the sensor-tip guidewire. Once adequate positioning was seen cystoscopically, the guidewire was removed. Adequate proximal and distal curls were seen fluoroscopically and cystoscopically, respectively.  At this point, the bladder was drained, the procedure terminated after the scope was removed. The patient tolerated procedure well. She was returned to the PACU in stable condition.

## 2015-08-19 NOTE — Evaluation (Addendum)
Physical Therapy Evaluation Patient Details Name: Cheryl Abbott MRN: [id number] DOB: [birth year] Today's Date: 08/19/2015   History of Present Illness  70 year old female who  has a past medical history of Anxiety disorder; Hyperlipidemia; Hypertension; Depression; Chronic back pain; Headache; Type 2 diabetes mellitus ([place]); GERD (gastroesophageal reflux disease); Anemia; History of adenomatous polyp of colon; Fatty liver; Sigmoid diverticulosis; Left ureteral stone; History of kidney stones; Arthritis; Hypercalcemia; OSA treated with BiPAP; Nephrolithiasis; Sciatica; Lumbar disc disease with radiculopathy; and Neuropathy, peripheral ([place]). Today presents to the hospital with generalized weakness for more the one-day duration. Patient says that today she fell while trying to do laundry and was too weak to get up. Imaging studies done in the ED including the CT of the head and x-ray of the knees show no significant abnormality. Patient is currently seen by urology for bilateral kidney stone and underwent urologic procedure 2 weeks ago. Patient was put on Cipro for UTI which she has been taking everyday. Also patient complains of vomiting every night for past 1 week. Patient today was supposed to get a renal ultrasound which showed right-sided hydronephrosis with bilateral kidney stones. UA was abnormal and patient started on ceftriaxone. She denies chest pain or shortness of breath. Denies dysuria urgency or frequency of urination. Denies hematuria. No abdominal pain.   Clinical Impression  Pt needed to use the bathroom when therapist entered room.  Pt able to ambulate to restroom with walker and supervision but once pt had finished pt did not have enough strength to stand I.  Nurse aide, Caryl Pina, assisted therapist to get patient back to bed.  Pt had been given mophine per nurse which would explain patient's drowsy state..  Pt is to have stones removed today. Once stones are removed and pt is not on  pain relieving medication therapist feels pt will return to prefunctional state quickly.     Follow Up Recommendations Home health PT    Equipment Recommendations  Rolling walker with 5" wheels    Recommendations for Other Services   none    Precautions / Restrictions Precautions Precautions: Fall Restrictions Weight Bearing Restrictions: No      Mobility  Bed Mobility Overal bed mobility: Needs Assistance Bed Mobility: Supine to Sit;Sit to Supine     Supine to sit: Min assist Sit to supine: Min assist      Transfers Overall transfer level: Needs assistance Equipment used: Rolling walker (2 wheeled) Transfers: Sit to/from Stand Sit to Stand: Supervision            Ambulation/Gait Ambulation/Gait assistance: Min guard   Assistive device: Rolling walker (2 wheeled) Gait Pattern/deviations: Decreased step length - right;Decreased step length - left Gait velocity: slow leaning on walker  Gait velocity interpretation: Below normal speed for age/gender    i         Pertinent Vitals/Pain Pain Assessment: 0-10 Pain Score: 8  Pain Descriptors / Indicators: Crying Pain Intervention(s): Limited activity within patient's tolerance;Monitored during session;Repositioned    Home Living Family/patient expects to be discharged to:: Private residence Living Arrangements: Alone Available Help at Discharge: Available PRN/intermittently;Family;Friend(s) Type of Home: House Home Access: Stairs to enter   [employer] of Steps: 3 Home Layout: One level Home Equipment: Walker - 2 wheels;Cane - single point      Prior Function Level of Independence: Independent               Hand Dominance   Dominant Hand: Right    Extremity/Trunk Assessment  Lower Extremity Assessment: Difficult to assess due to impaired cognition (Pt premedicated )         Communication   Communication: No difficulties  Cognition Arousal/Alertness:  Lethargic;Suspect due to medications Behavior During Therapy: Restless Overall Cognitive Status: Within Functional Limits for tasks assessed                               Assessment/Plan    PT Assessment Patient needs continued PT services  PT Diagnosis Difficulty walking;Acute pain   PT Problem List Decreased strength;Decreased activity tolerance  PT Treatment Interventions Gait training;Therapeutic exercise   PT Goals (Current goals can be found in the Care Plan section) Acute Rehab PT Goals PT Goal Formulation: With patient Potential to Achieve Goals: Good    Frequency Min 3X/week   Barriers to discharge    lives alone       End of Session Equipment Utilized During Treatment: Gait belt Activity Tolerance: Patient limited by pain Patient left: in bed;with bed alarm set;with call bell/phone within reach           Time: 1530-1608 PT Time Calculation (min) (ACUTE ONLY): 38 min   Charges:   PT Evaluation $PT Eval Moderate Complexity: 1 Procedure     PT G Codes:  [id number]  CM                         [place]                         [id number]  [place], PT CLT [phone] 08/19/2015, 4:09 PM

## 2015-08-20 DIAGNOSIS — R531 Weakness: Secondary | ICD-10-CM | POA: Diagnosis not present

## 2015-08-20 DIAGNOSIS — N183 Chronic kidney disease, stage 3 (moderate): Secondary | ICD-10-CM

## 2015-08-20 DIAGNOSIS — W19XXXD Unspecified fall, subsequent encounter: Secondary | ICD-10-CM | POA: Diagnosis not present

## 2015-08-20 DIAGNOSIS — N390 Urinary tract infection, site not specified: Secondary | ICD-10-CM

## 2015-08-20 DIAGNOSIS — R509 Fever, unspecified: Secondary | ICD-10-CM

## 2015-08-20 DIAGNOSIS — R1031 Right lower quadrant pain: Secondary | ICD-10-CM | POA: Diagnosis not present

## 2015-08-20 DIAGNOSIS — N200 Calculus of kidney: Secondary | ICD-10-CM | POA: Diagnosis not present

## 2015-08-20 DIAGNOSIS — N1330 Unspecified hydronephrosis: Secondary | ICD-10-CM | POA: Diagnosis not present

## 2015-08-20 DIAGNOSIS — E1121 Type 2 diabetes mellitus with diabetic nephropathy: Secondary | ICD-10-CM | POA: Diagnosis not present

## 2015-08-20 DIAGNOSIS — D72829 Elevated white blood cell count, unspecified: Secondary | ICD-10-CM | POA: Diagnosis not present

## 2015-08-20 DIAGNOSIS — I10 Essential (primary) hypertension: Secondary | ICD-10-CM | POA: Diagnosis not present

## 2015-08-20 LAB — GLUCOSE, CAPILLARY
Glucose-Capillary: 103 mg/dL — ABNORMAL HIGH (ref 65–99)
Glucose-Capillary: 102 mg/dL — ABNORMAL HIGH (ref 65–99)
Glucose-Capillary: 106 mg/dL — ABNORMAL HIGH (ref 65–99)
Glucose-Capillary: 91 mg/dL (ref 65–99)

## 2015-08-20 LAB — CBC
HCT: 27.6 % — ABNORMAL LOW (ref 36.0–46.0)
Hemoglobin: 8.5 g/dL — ABNORMAL LOW (ref 12.0–15.0)
MCH: 22.5 pg — ABNORMAL LOW (ref 26.0–34.0)
MCHC: 30.8 g/dL (ref 30.0–36.0)
MCV: 73.2 fL — ABNORMAL LOW (ref 78.0–100.0)
Platelets: 561 10*3/uL — ABNORMAL HIGH (ref 150–400)
RBC: 3.77 MIL/uL — ABNORMAL LOW (ref 3.87–5.11)
RDW: 18.9 % — ABNORMAL HIGH (ref 11.5–15.5)
WBC: 15.2 10*3/uL — ABNORMAL HIGH (ref 4.0–10.5)

## 2015-08-20 LAB — BASIC METABOLIC PANEL
Anion gap: 8 (ref 5–15)
BUN: 12 mg/dL (ref 6–20)
CO2: 25 mmol/L (ref 22–32)
Calcium: 9.2 mg/dL (ref 8.9–10.3)
Chloride: 105 mmol/L (ref 101–111)
Creatinine, Ser: 1.69 mg/dL — ABNORMAL HIGH (ref 0.44–1.00)
GFR calc Af Amer: 34 mL/min — ABNORMAL LOW (ref >60)
GFR calc non Af Amer: 29 mL/min — ABNORMAL LOW (ref >60)
Glucose, Bld: 117 mg/dL — ABNORMAL HIGH (ref 65–99)
Potassium: 3.9 mmol/L (ref 3.5–5.1)
Sodium: 138 mmol/L (ref 135–145)

## 2015-08-20 LAB — URINE CULTURE

## 2015-08-20 MED ORDER — OXYCODONE HCL 5 MG PO TABS
5.0000 mg | ORAL_TABLET | Freq: Four times a day (QID) | ORAL | Status: DC | PRN
  Administered 2015-08-20 – 2015-08-22 (×4): 5 mg via ORAL
  Filled 2015-08-20 (×4): qty 1

## 2015-08-20 NOTE — Addendum Note (Signed)
Addendum  created 08/20/15 1321 by Charmaine Downs, CRNA   Modules edited: Clinical Notes   Clinical Notes:  File: [id number]

## 2015-08-20 NOTE — Progress Notes (Addendum)
Progress Note   Cheryl Abbott [id number] DOB: 1946/03/03 DOA: 08/18/2015 PCP: Tula Nakayama, MD   Brief Narrative:   ARMATHA KARGE is an 70 y.o. female with a PMH of hypertension, stage III chronic kidney disease, hyperlipidemia, depression/anxiety, type 2 diabetes, OSA on BiPAP and peripheral neuropathy who was admitted 08/18/15 with a chief complaint of generalized weakness resulting in a fall. She also reported having undergone a urologic procedure 2 weeks ago and has been maintained on Cipro for UTI. She has had vomiting for the past week. Upon initial evaluation in the ED, renal ultrasound showed prominent right-sided hydronephrosis and bilateral nephrolithiasis. Plain films of the bilateral hips and knees were without fracture. CT of the head showed a soft tissue swelling in the right parietal area with no associated fracture. Urine microscopy showed too numerous to count WBCs and many bacteria but was a contaminated specimen.  Assessment/Plan:     Fall/generalized weakness secondary to possible complicated UTI - Physical therapy evaluation requested: home health, rolling walker - Urinalysis findings concerning for infection, but specimen was contaminated. Continue Rocephin for now- fever trending down - urine culture- with multiple species (was on cipro as outpatient recently) -no blood cultures done-- if another fever, will plan to get cultures  Leukocytosis -WBC trending down    Type 2 diabetes with nephropathy (HCC) - Metformin currently on hold. Add insulin sensitive SSI.    Hypertension goal BP (blood pressure) < 130/80 - Continue Norvasc.    RENAL CALCULUS/right-sided hydronephrosis - Chronic problem. - s/p Cystoscopy, right retrograde ureteropyelogram, interpretive fluoroscopy, double-J stent placement-6 [place] by 24 cm without tether    Obesity (BMI 30.0-34.9) - Heart healthy diet.    CKD (chronic kidney disease) stage 3, GFR 30-59 ml/min - Baseline  creatinine appears to be about 1-1.25. Creatinine has risen since 07/20/15. - Patient is allergic to ace inhibitors.  - CKD likely from hypertension/DM with acute worsening secondary to obstructive uropathy.    Cerebral microvascular disease - Patient should likely be on aspirin therapy- start upon d/c    DVT Prophylaxis - SCDs ordered.   Family Communication/Anticipated D/C date and plan/Code Status   Family Communication: no family at bedside- called daughter [facility]- no answer Disposition Plan/date: once fever and WBC resolved Code Status: Full code.   IV Access:    Peripheral IV   Procedures and diagnostic studies:   Dg Chest 2 View  08/18/2015  CLINICAL DATA:  Fever.  Fall EXAM: CHEST  2 VIEW COMPARISON:  08/06/2015 FINDINGS: Elevated right hemidiaphragm with right lower lobe scarring unchanged from the prior study. Negative for heart failure. No edema or effusion. Negative for mass or pneumonia. IMPRESSION: No active cardiopulmonary disease. Electronically Signed   By: Franchot Gallo M.D.   On: 08/18/2015 18:49   Ct Head Wo Contrast  08/18/2015  CLINICAL DATA:  Post fall at home hitting posterior aspect of the head. EXAM: CT HEAD WITHOUT CONTRAST TECHNIQUE: Contiguous axial images were obtained from the base of the skull through the vertex without intravenous contrast. COMPARISON:  05/10/2013; brain MRI -11/27/2011 FINDINGS: There is a potential minimal amount of subcutaneous stranding about the high right posterior parietal calvaria (image 32, series 2) without associated displaced calvarial fracture or radiopaque foreign body. Similar findings of centralized atrophy with mild ex vacuo dilatation of the ventricular system. Scattered minimal periventricular hypodensities compatible microvascular ischemic disease. The gray-white differentiation is otherwise well maintained without CT evidence of acute large territory infarct. No intraparenchymal or  extra-axial mass or hemorrhage.  Unchanged size a configuration of the ventricles and basilar cisterns. No midline shift. Intracranial atherosclerosis. Limited visualization the paranasal sinuses and mastoid air cells is normal. IMPRESSION: 1. Very minimal amount of soft tissue stranding about the high right posterior parietal calvarium without associated displaced calvarial fracture, radiopaque foreign body or acute intracranial process. 2. Similar findings of centralized atrophy and microvascular ischemic disease. Electronically Signed   By: Sandi Mariscal M.D.   On: 08/18/2015 17:30   US Renal  08/18/2015  CLINICAL DATA:  Ureteral calculus. EXAM: RENAL / URINARY TRACT ULTRASOUND COMPLETE COMPARISON:  CT 07/26/2015. FINDINGS: Right Kidney: Length: 13.6 cm. Echogenicity within normal limits. No mass. Prominent right hydronephrosis again noted. Probable right nephrolithiasis. Left Kidney: Length: 13.2 cm. Echogenicity within normal limits. No mass or hydronephrosis visualized. Probable left nephrolithiasis. Bladder: Appears normal for degree of bladder distention. No ureteral jets identified. Patient refused postvoid exam. IMPRESSION: Prominent right hydronephrosis again noted. Bilateral nephrolithiasis. Electronically Signed   By: Marcello Moores  Register   On: 08/18/2015 11:00   Dg Knee Complete 4 Views Left  08/18/2015  CLINICAL DATA:  Fall.  Bilateral hip and knee pain EXAM: LEFT KNEE - COMPLETE 4+ VIEW COMPARISON:  07/24/2004 FINDINGS: There is no evidence of fracture, dislocation, or joint effusion. There is no evidence of arthropathy or other focal bone abnormality. Soft tissues are unremarkable. IMPRESSION: Negative. Electronically Signed   By: Franchot Gallo M.D.   On: 08/18/2015 17:01   Dg Knee Complete 4 Views Right  08/18/2015  CLINICAL DATA:  Bilateral hip and bilateral knee pain after falling while taking clothes out of her dryer today. PT states she has had several falls in the past. EXAM: RIGHT KNEE - COMPLETE 4+ VIEW COMPARISON:  None.  FINDINGS: There is no evidence of fracture, dislocation, or joint effusion. There is generalized osteopenia. Soft tissues are unremarkable. IMPRESSION: No acute osseous injury of the right knee. Electronically Signed   By: Kathreen Devoid   On: 08/18/2015 17:01   Dg Hips Bilat With Pelvis 3-4 Views  08/18/2015  CLINICAL DATA:  Fall.  Bilateral hip pain EXAM: DG HIP (WITH OR WITHOUT PELVIS) 3-4V BILAT COMPARISON:  08/06/2015 FINDINGS: There is no evidence of hip fracture or dislocation. There is no evidence of arthropathy or other focal bone abnormality. IMPRESSION: Negative. Electronically Signed   By: Franchot Gallo M.D.   On: 08/18/2015 17:07     Medical Consultants:    Urology  Anti-Infectives:    Rocephin 08/18/15--->  Subjective:    Weak appearing this AM No pain  Objective:    Filed Vitals:   08/19/15 1945 08/19/15 2000 08/19/15 2019 08/20/15 0700  BP: 110/71 119/71 121/62 132/74  Pulse: 99  100 98  Temp:   98.6 F (37 C) 98.9 F (37.2 C)  TempSrc:    Oral  Resp: 27  22 20   Height:      Weight:      SpO2: 93%  92% 94%    Intake/Output Summary (Last 24 hours) at 08/20/15 1319 Last data filed at 08/20/15 0600  Gross per 24 hour  Intake    700 ml  Output   1250 ml  Net   -550 ml   Filed Weights   08/18/15 1550 08/19/15 0056  Weight: 83.008 kg (183 lb) 82.827 kg (182 lb 9.6 oz)    Exam: Gen:  NAD Cardiovascular:  RRR, No M/R/G Respiratory:  Lungs CTAB Gastrointestinal:  Abdomen soft, NT/ND, + BS Extremities:  No C/E/C   Data Reviewed:    Labs: Basic Metabolic Panel:  Recent Labs Lab 08/18/15 1711 08/19/15 0718 08/20/15 0704  NA 131* 137 138  K 4.2 4.2 3.9  CL 98* 102 105  CO2 25 24 25   GLUCOSE 119* 110* 117*  BUN 16 13 12   CREATININE 1.69* 1.69* 1.69*  CALCIUM 9.5 9.6 9.2   GFR Estimated Creatinine Clearance: 33.1 mL/min (by C-G formula based on Cr of 1.69). Liver Function Tests:  Recent Labs Lab 08/19/15 0718  AST 17  ALT 9*   ALKPHOS 57  BILITOT 0.3  PROT 8.5*  ALBUMIN 3.0*   CBC:  Recent Labs Lab 08/18/15 1711 08/19/15 0718 08/20/15 0704  WBC 15.6* 17.4* 15.2*  NEUTROABS 12.4*  --   --   HGB 8.5* 8.7* 8.5*  HCT 27.6* 27.4* 27.6*  MCV 73.0* 72.7* 73.2*  PLT 534* 561* 561*   Sepsis Labs:  Recent Labs Lab 08/18/15 1711 08/18/15 2204 08/19/15 0718 08/20/15 0704  WBC 15.6*  --  17.4* 15.2*  LATICACIDVEN 1.3 1.3  --   --    Microbiology Recent Results (from the past 240 hour(s))  Urine culture     Status: None   Collection Time: 08/18/15  6:00 PM  Result Value Ref Range Status   Specimen Description Urine  Final   Special Requests NONE  Final   Culture   Final    MULTIPLE SPECIES PRESENT, SUGGEST RECOLLECTION Performed at [facility]    Report Status 08/20/2015 FINAL  Final  MRSA PCR Screening     Status: None   Collection Time: 08/19/15  4:55 PM  Result Value Ref Range Status   MRSA by PCR NEGATIVE NEGATIVE Final    Comment:        The GeneXpert MRSA Assay (FDA approved for NASAL specimens only), is one component of a comprehensive MRSA colonization surveillance program. It is not intended to diagnose MRSA infection nor to guide or monitor treatment for MRSA infections.      Medications:   . cefTRIAXone (ROCEPHIN)  IV  1 g Intravenous Q24H  . enoxaparin (LOVENOX) injection  40 mg Subcutaneous Q0600  . gabapentin  100 mg Oral QHS  . insulin aspart  0-9 Units Subcutaneous TID WC  . pantoprazole  40 mg Oral BID  . PARoxetine  40 mg Oral Daily  . pravastatin  80 mg Oral q1800   Continuous Infusions: . sodium chloride Stopped (08/19/15 1805)    Time spent: 25 minutes.     [facility] Pager [phone].  08/20/2015, 1:19 PM

## 2015-08-20 NOTE — Anesthesia Postprocedure Evaluation (Signed)
Anesthesia Post Note  Patient: Cheryl Abbott  Procedure(s) Performed: Procedure(s) (LRB): CYSTOSCOPY WITH RETROGRADE PYELOGRAM/URETERAL STENT PLACEMENT (Right)  Patient location during evaluation: Nursing Unit Anesthesia Type: General Level of consciousness: awake, oriented and patient cooperative Pain management: pain level controlled Vital Signs Assessment: post-procedure vital signs reviewed and stable Respiratory status: spontaneous breathing and patient connected to nasal cannula oxygen Cardiovascular status: blood pressure returned to baseline Postop Assessment: no signs of nausea or vomiting Anesthetic complications: no    Last Vitals:  Filed Vitals:   08/19/15 2019 08/20/15 0700  BP: 121/62 132/74  Pulse: 100 98  Temp: 37 C 37.2 C  Resp: 22 20    Last Pain:  Filed Vitals:   08/20/15 1218  PainSc: 5                  Adrienne Delay J

## 2015-08-20 NOTE — Progress Notes (Signed)
I spoke w/ pt--would recommend leaving stent in as treatment for ureteral stricture. Will need later procedure for ureteral dilation/ureteroscopy. Will arrange as out patient.

## 2015-08-21 DIAGNOSIS — D72829 Elevated white blood cell count, unspecified: Secondary | ICD-10-CM | POA: Diagnosis not present

## 2015-08-21 DIAGNOSIS — I10 Essential (primary) hypertension: Secondary | ICD-10-CM | POA: Diagnosis not present

## 2015-08-21 DIAGNOSIS — N390 Urinary tract infection, site not specified: Secondary | ICD-10-CM | POA: Diagnosis not present

## 2015-08-21 DIAGNOSIS — N200 Calculus of kidney: Secondary | ICD-10-CM | POA: Diagnosis not present

## 2015-08-21 DIAGNOSIS — R1031 Right lower quadrant pain: Secondary | ICD-10-CM | POA: Diagnosis not present

## 2015-08-21 DIAGNOSIS — N183 Chronic kidney disease, stage 3 (moderate): Secondary | ICD-10-CM | POA: Diagnosis not present

## 2015-08-21 DIAGNOSIS — N1330 Unspecified hydronephrosis: Secondary | ICD-10-CM | POA: Diagnosis not present

## 2015-08-21 DIAGNOSIS — E1121 Type 2 diabetes mellitus with diabetic nephropathy: Secondary | ICD-10-CM | POA: Diagnosis not present

## 2015-08-21 LAB — BASIC METABOLIC PANEL
Anion gap: 7 (ref 5–15)
BUN: 10 mg/dL (ref 6–20)
CO2: 25 mmol/L (ref 22–32)
Calcium: 9.3 mg/dL (ref 8.9–10.3)
Chloride: 107 mmol/L (ref 101–111)
Creatinine, Ser: 1.41 mg/dL — ABNORMAL HIGH (ref 0.44–1.00)
GFR calc Af Amer: 42 mL/min — ABNORMAL LOW (ref >60)
GFR calc non Af Amer: 37 mL/min — ABNORMAL LOW (ref >60)
Glucose, Bld: 110 mg/dL — ABNORMAL HIGH (ref 65–99)
Potassium: 3.8 mmol/L (ref 3.5–5.1)
Sodium: 139 mmol/L (ref 135–145)

## 2015-08-21 LAB — CBC
HCT: 28 % — ABNORMAL LOW (ref 36.0–46.0)
Hemoglobin: 8.5 g/dL — ABNORMAL LOW (ref 12.0–15.0)
MCH: 22.3 pg — ABNORMAL LOW (ref 26.0–34.0)
MCHC: 30.4 g/dL (ref 30.0–36.0)
MCV: 73.3 fL — ABNORMAL LOW (ref 78.0–100.0)
Platelets: 543 10*3/uL — ABNORMAL HIGH (ref 150–400)
RBC: 3.82 MIL/uL — ABNORMAL LOW (ref 3.87–5.11)
RDW: 19.2 % — ABNORMAL HIGH (ref 11.5–15.5)
WBC: 11.7 10*3/uL — ABNORMAL HIGH (ref 4.0–10.5)

## 2015-08-21 LAB — GLUCOSE, CAPILLARY
Glucose-Capillary: 101 mg/dL — ABNORMAL HIGH (ref 65–99)
Glucose-Capillary: 100 mg/dL — ABNORMAL HIGH (ref 65–99)
Glucose-Capillary: 78 mg/dL (ref 65–99)
Glucose-Capillary: 80 mg/dL (ref 65–99)

## 2015-08-21 LAB — IRON AND TIBC
Iron: 14 ug/dL — ABNORMAL LOW (ref 28–170)
Saturation Ratios: 4 % — ABNORMAL LOW (ref 10.4–31.8)
TIBC: 379 ug/dL (ref 250–450)
UIBC: 365 ug/dL

## 2015-08-21 LAB — FERRITIN: Ferritin: 11 ng/mL (ref 11–307)

## 2015-08-21 MED ORDER — SENNOSIDES-DOCUSATE SODIUM 8.6-50 MG PO TABS
2.0000 | ORAL_TABLET | Freq: Two times a day (BID) | ORAL | Status: DC
  Administered 2015-08-21 – 2015-08-22 (×3): 2 via ORAL
  Filled 2015-08-21 (×3): qty 2

## 2015-08-21 MED ORDER — CEFUROXIME AXETIL 250 MG PO TABS
500.0000 mg | ORAL_TABLET | Freq: Two times a day (BID) | ORAL | Status: DC
Start: 2015-08-21 — End: 2015-08-22
  Administered 2015-08-21 – 2015-08-22 (×2): 500 mg via ORAL
  Filled 2015-08-21 (×2): qty 2

## 2015-08-21 MED ORDER — ZOLPIDEM TARTRATE 5 MG PO TABS
5.0000 mg | ORAL_TABLET | Freq: Once | ORAL | Status: AC
  Administered 2015-08-21: 5 mg via ORAL
  Filled 2015-08-21: qty 1

## 2015-08-21 NOTE — Progress Notes (Addendum)
Progress Note   Cheryl Abbott DOB: Jun 12, 1945 DOA: 08/18/2015 PCP: Tula Nakayama, MD   Brief Narrative:   Cheryl Abbott is an 71 y.o. female with a PMH of hypertension, stage III chronic kidney disease, hyperlipidemia, depression/anxiety, type 2 diabetes, OSA on BiPAP and peripheral neuropathy who was admitted 08/18/15 with a chief complaint of generalized weakness resulting in a fall. She also reported having undergone a urologic procedure 2 weeks ago and has been maintained on Cipro for UTI. She has had vomiting for the past week. Upon initial evaluation in the ED, renal ultrasound showed prominent right-sided hydronephrosis and bilateral nephrolithiasis. Plain films of the bilateral hips and knees were without fracture. CT of the head showed a soft tissue swelling in the right parietal area with no associated fracture. Urine microscopy showed too numerous to count WBCs and many bacteria but was a contaminated specimen.  Assessment/Plan:     Fall/generalized weakness secondary to possible complicated UTI - Physical therapy evaluation re-eval requested: home health, rolling walker - Urinalysis findings concerning for infection, but specimen was contaminated.  -change rocephin to PO abx ceftin and watch for 24 hours - urine culture- with multiple species (was on cipro as outpatient recently) -no further fevers  Anemia -suspect Fe def as has low MCV and has h/o -check Fe panel, may need IV fe and Po replacement upon d/c -check POC  Leukocytosis -WBC trending down    Type 2 diabetes with nephropathy (HCC) - Metformin currently on hold. Add insulin sensitive SSI.    Hypertension goal BP (blood pressure) < 130/80 - Continue Norvasc.    RENAL CALCULUS/right-sided hydronephrosis - Chronic problem. - s/p Cystoscopy, right retrograde ureteropyelogram, interpretive fluoroscopy, double-J stent placement-6 [place] by 24 cm without tether    Obesity (BMI  30.0-34.9) - Heart healthy diet.    CKD (chronic kidney disease) stage 3, GFR 30-59 ml/min - Baseline creatinine appears to be about 1-1.25. Creatinine has risen since 07/20/15. - Patient is allergic to ace inhibitors.  - CKD likely from hypertension/DM with acute worsening secondary to obstructive uropathy.    Cerebral microvascular disease - Patient should likely be on aspirin therapy- start upon d/c    DVT Prophylaxis - SCDs ordered.   Family Communication/Anticipated D/C date and plan/Code Status   Family Communication: no family at bedside- called daughter Stanton [facility]- 3/12 Disposition Plan/date: home in the AM? Code Status: Full code.   IV Access:    Peripheral IV   Procedures and diagnostic studies:   Dg Chest 2 View  08/18/2015  CLINICAL DATA:  Fever.  Fall EXAM: CHEST  2 VIEW COMPARISON:  08/06/2015 FINDINGS: Elevated right hemidiaphragm with right lower lobe scarring unchanged from the prior study. Negative for heart failure. No edema or effusion. Negative for mass or pneumonia. IMPRESSION: No active cardiopulmonary disease. Electronically Signed   By: Franchot Gallo M.D.   On: 08/18/2015 18:49   Ct Head Wo Contrast  08/18/2015  CLINICAL DATA:  Post fall at home hitting posterior aspect of the head. EXAM: CT HEAD WITHOUT CONTRAST TECHNIQUE: Contiguous axial images were obtained from the base of the skull through the vertex without intravenous contrast. COMPARISON:  05/10/2013; brain MRI -11/27/2011 FINDINGS: There is a potential minimal amount of subcutaneous stranding about the high right posterior parietal calvaria (image 32, series 2) without associated displaced calvarial fracture or radiopaque foreign body. Similar findings of centralized atrophy with mild ex vacuo dilatation of the ventricular system. Scattered minimal periventricular hypodensities compatible  microvascular ischemic disease. The gray-white differentiation is otherwise well maintained without CT evidence of acute  large territory infarct. No intraparenchymal or extra-axial mass or hemorrhage. Unchanged size a configuration of the ventricles and basilar cisterns. No midline shift. Intracranial atherosclerosis. Limited visualization the paranasal sinuses and mastoid air cells is normal. IMPRESSION: 1. Very minimal amount of soft tissue stranding about the high right posterior parietal calvarium without associated displaced calvarial fracture, radiopaque foreign body or acute intracranial process. 2. Similar findings of centralized atrophy and microvascular ischemic disease. Electronically Signed   By: Sandi Mariscal M.D.   On: 08/18/2015 17:30   US Renal  08/18/2015  CLINICAL DATA:  Ureteral calculus. EXAM: RENAL / URINARY TRACT ULTRASOUND COMPLETE COMPARISON:  CT 07/26/2015. FINDINGS: Right Kidney: Length: 13.6 cm. Echogenicity within normal limits. No mass. Prominent right hydronephrosis again noted. Probable right nephrolithiasis. Left Kidney: Length: 13.2 cm. Echogenicity within normal limits. No mass or hydronephrosis visualized. Probable left nephrolithiasis. Bladder: Appears normal for degree of bladder distention. No ureteral jets identified. Patient refused postvoid exam. IMPRESSION: Prominent right hydronephrosis again noted. Bilateral nephrolithiasis. Electronically Signed   By: Marcello Moores  Register   On: 08/18/2015 11:00   Dg Knee Complete 4 Views Left  08/18/2015  CLINICAL DATA:  Fall.  Bilateral hip and knee pain EXAM: LEFT KNEE - COMPLETE 4+ VIEW COMPARISON:  07/24/2004 FINDINGS: There is no evidence of fracture, dislocation, or joint effusion. There is no evidence of arthropathy or other focal bone abnormality. Soft tissues are unremarkable. IMPRESSION: Negative. Electronically Signed   By: Franchot Gallo M.D.   On: 08/18/2015 17:01   Dg Knee Complete 4 Views Right  08/18/2015  CLINICAL DATA:  Bilateral hip and bilateral knee pain after falling while taking clothes out of her dryer today. PT states she has had  several falls in the past. EXAM: RIGHT KNEE - COMPLETE 4+ VIEW COMPARISON:  None. FINDINGS: There is no evidence of fracture, dislocation, or joint effusion. There is generalized osteopenia. Soft tissues are unremarkable. IMPRESSION: No acute osseous injury of the right knee. Electronically Signed   By: Kathreen Devoid   On: 08/18/2015 17:01   Dg Hips Bilat With Pelvis 3-4 Views  08/18/2015  CLINICAL DATA:  Fall.  Bilateral hip pain EXAM: DG HIP (WITH OR WITHOUT PELVIS) 3-4V BILAT COMPARISON:  08/06/2015 FINDINGS: There is no evidence of hip fracture or dislocation. There is no evidence of arthropathy or other focal bone abnormality. IMPRESSION: Negative. Electronically Signed   By: Franchot Gallo M.D.   On: 08/18/2015 17:07     Medical Consultants:    Urology  Anti-Infectives:    Rocephin 08/18/15---> ceftin 3/12  Subjective:    Eating breakfast Has not had BM- requesting stool softener  Objective:    Filed Vitals:   08/20/15 0700 08/20/15 1445 08/20/15 2152 08/21/15 0430  BP: 132/74 119/58 127/78 118/64  Pulse: 98 103 101 107  Temp: 98.9 F (37.2 C) 99.3 F (37.4 C) 99.3 F (37.4 C) 98.3 F (36.8 C)  TempSrc: Oral Oral Oral Oral  Resp: 20 20 20 20   Height:      Weight:      SpO2: 94% 94% 91% 93%    Intake/Output Summary (Last 24 hours) at 08/21/15 1218 Last data filed at 08/21/15 [id number]  Gross per 24 hour  Intake    240 ml  Output   1900 ml  Net  -1660 ml   Filed Weights   08/18/15 1550 08/19/15 0056  Weight: 83.008 kg (183  lb) 82.827 kg (182 lb 9.6 oz)    Exam: Gen:  NAD Cardiovascular:  RRR, No M/R/G Respiratory:  Lungs CTAB Gastrointestinal:  Abdomen soft, NT/ND, + BS Extremities:  No C/E/C   Data Reviewed:    Labs: Basic Metabolic Panel:  Recent Labs Lab 08/18/15 1711 08/19/15 0718 08/20/15 0704 08/21/15 0655  NA 131* 137 138 139  K 4.2 4.2 3.9 3.8  CL 98* 102 105 107  CO2 25 24 25 25   GLUCOSE 119* 110* 117* 110*  BUN 16 13 12 10    CREATININE 1.69* 1.69* 1.69* 1.41*  CALCIUM 9.5 9.6 9.2 9.3   GFR Estimated Creatinine Clearance: 39.7 mL/min (by C-G formula based on Cr of 1.41). Liver Function Tests:  Recent Labs Lab 08/19/15 0718  AST 17  ALT 9*  ALKPHOS 57  BILITOT 0.3  PROT 8.5*  ALBUMIN 3.0*   CBC:  Recent Labs Lab 08/18/15 1711 08/19/15 0718 08/20/15 0704 08/21/15 0655  WBC 15.6* 17.4* 15.2* 11.7*  NEUTROABS 12.4*  --   --   --   HGB 8.5* 8.7* 8.5* 8.5*  HCT 27.6* 27.4* 27.6* 28.0*  MCV 73.0* 72.7* 73.2* 73.3*  PLT 534* 561* 561* 543*   Sepsis Labs:  Recent Labs Lab 08/18/15 1711 08/18/15 2204 08/19/15 0718 08/20/15 0704 08/21/15 0655  WBC 15.6*  --  17.4* 15.2* 11.7*  LATICACIDVEN 1.3 1.3  --   --   --    Microbiology Recent Results (from the past 240 hour(s))  Urine culture     Status: None   Collection Time: 08/18/15  6:00 PM  Result Value Ref Range Status   Specimen Description Urine  Final   Special Requests NONE  Final   Culture   Final    MULTIPLE SPECIES PRESENT, SUGGEST RECOLLECTION Performed at [facility]    Report Status 08/20/2015 FINAL  Final  MRSA PCR Screening     Status: None   Collection Time: 08/19/15  4:55 PM  Result Value Ref Range Status   MRSA by PCR NEGATIVE NEGATIVE Final    Comment:        The GeneXpert MRSA Assay (FDA approved for NASAL specimens only), is one component of a comprehensive MRSA colonization surveillance program. It is not intended to diagnose MRSA infection nor to guide or monitor treatment for MRSA infections.      Medications:   . cefUROXime  500 mg Oral BID WC  . enoxaparin (LOVENOX) injection  40 mg Subcutaneous Q0600  . gabapentin  100 mg Oral QHS  . insulin aspart  0-9 Units Subcutaneous TID WC  . pantoprazole  40 mg Oral BID  . PARoxetine  40 mg Oral Daily  . pravastatin  80 mg Oral q1800  . senna-docusate  2 tablet Oral BID   Continuous Infusions:    Time spent: 25 minutes.     [place]  [facility] Pager [phone].  08/21/2015, 12:18 PM

## 2015-08-21 NOTE — H&P (Signed)
Author: Franchot Gallo, MD Author Type: Physician Filed: 07/12/2015 11:50 AM    Note Status: Signed Cosign: Cosign Not Required Note Time: 07/12/2015 11:45 AM    Editor: Franchot Gallo, MD (Physician)         Expand All Collapse All   H&P  Chief Complaint: Bladder pain  History of Present Illness: Cheryl Abbott is a 70 y.o. year old female who is admitted through my office for management of a urinary tract infection. She underwent ureteroscopic management of right renal calculi on 06/28/2015. She was sent home on 5 days of Bactrim. 3-4 days ago, she began having lower abdominal discomfort, frequency, urgency and increasing gross hematuria. She denies lateralizing flank pain, but has had some nausea and one episode of emesis over the past day or 2. She denies fever, shakes, chills. She went to the emergency room here in [place] 2 days ago where she was found to have a UTI. There was no leukocytosis on her blood count. She did have a CT revealing a decompressed right kidney with a few residual fragments from stones, as well as a 4 mm left distal ureteral stone with no hydronephrosis. She was seen in follow-up in the office today. She had mild suprapubic tenderness but no lateralizing flank tenderness. There was no recent fever or chills. Because she looked uncomfortable, but not septic, I have admitted her for further management.  Past Medical History  Diagnosis Date  . Anxiety disorder   . Hyperlipidemia   . Hypertension   . Depression   . Chronic back pain   . Headache   . Type 2 diabetes mellitus ([place])   . GERD (gastroesophageal reflux disease)   . Anemia   . History of adenomatous polyp of colon     2008  . Fatty liver   . Sigmoid diverticulosis   . Left ureteral stone   . History of kidney stones   . Arthritis   . Hypercalcemia   . OSA treated with BiPAP     per study 2007  . Nephrolithiasis   .  Sciatica   . Lumbar disc disease with radiculopathy   . Neuropathy, peripheral [facility])     Past Surgical History  Procedure Laterality Date  . Flexible sigmoidoscopy  09/11/2011    [id number] Internal hemorrhoids  . Esophagogastroduodenoscopy (egd) with propofol N/A 03/17/2013    Procedure: ESOPHAGOGASTRODUODENOSCOPY (EGD) WITH PROPOFOL; Surgeon: Danie Binder, MD; Location: [facility]; Service: Endoscopy; Laterality: N/A;  . Savory dilation N/A 03/17/2013    Procedure: SAVORY DILATION; Surgeon: Danie Binder, MD; Location: [facility]; Service: Endoscopy; Laterality: N/A; #12.8, 14, 15, 16 dilators used  . Biopsy N/A 03/17/2013    Procedure: GASTRIC BIOPSIES; Surgeon: Danie Binder, MD; Location: [facility]; Service: Endoscopy; Laterality: N/A;  . Polypectomy N/A 03/17/2013    Procedure: GASTRIC POLYPECTOMY; Surgeon: Danie Binder, MD; Location: [facility]; Service: Endoscopy; Laterality: N/A;  . Colonoscopy  last one 10/02/2011    MOD [address] TICS, IH, NEXT TCS APR 2018  . Cysto/ right ureterosocopy laser lithotripsy stone extraction  10-13-2004  . Cysto/ right retrograde pyelogram/ placment right ureteral stent  01-10-2010  . Percutaneous nephrostolithotomy Bilateral 12/ 2011 [facility]  . Cardiovascular stress test  01-01-2014    normal lexiscan cardiolite/ no ischemia/ infarct/ normal LV function and wall motion , ef 81%  . Transthoracic echocardiogram  01-01-2014    mild LVH/ ef [id number] grade I diastolic dysfunction/ trivial MR, TR, and PR  . Removal right thigh  cyst  2006  . Vaginal hysterectomy  1970's  . Cystoscopy with retrograde pyelogram, ureteroscopy and stent placement Left 10/21/2014    Procedure: CYSTOSCOPY WITH RETROGRADE PYELOGRAM, URETEROSCOPY AND STENT PLACEMENT; Surgeon: Franchot Gallo, MD; Location: [facility]; Service: Urology; Laterality:  Left;  . Holmium laser application Left [id number]    Procedure: HOLMIUM LASER APPLICATION; Surgeon: Franchot Gallo, MD; Location: [facility]; Service: Urology; Laterality: Left;  Marland Kitchen Mass excision Left 03/04/2015    Procedure: EXCISION OF SOFT TISSUE NEOPLASM LEFT ARM; Surgeon: Aviva Signs, MD; Location: [facility]; Service: General; Laterality: Left;  . Esophagogastroduodenoscopy (egd) with propofol N/A 06/10/2015    Procedure: ESOPHAGOGASTRODUODENOSCOPY (EGD) WITH PROPOFOL; Surgeon: Danie Binder, MD; [facility]; Service: Endoscopy; Laterality: N/A; 1245 - pt knows to arrive at 8:30  . Savory dilation N/A 06/10/2015    Procedure: SAVORY DILATION; Surgeon: Danie Binder, MD; [facility]; Service: Endoscopy; Laterality: N/A;  . Biopsy  06/10/2015    Procedure: BIOPSY; Surgeon: Danie Binder, MD; Location[facility]; Service: Endoscopy;; gastric biopsy  . Cardiac catheterization  05-11-2003 dr Shelva Majestic ([facility])    Abnormal cardiolite/ Normal coronary arteries and normal LVF, ef 63%  . Cystoscopy/retrograde/ureteroscopy/stone extraction with basket Right 06/28/2015    Procedure: CYSTOSCOPY/RETROGRADE/URETEROSCOPY/STONE EXTRACTION WITH BASKET, RIGHT URETERAL DOUBLE J STENT PLACEMENT; Surgeon: Franchot Gallo, MD; Location: [facility]; Service: Urology; Laterality: Right;  . Holmium laser application Right [id number]    Procedure: HOLMIUM LASER APPLICATION; Surgeon: Franchot Gallo, MD; Location: [facility]; Service: Urology; Laterality: Right;    Home Medications:   (Not in a hospital admission)  Allergies:  Allergies  Allergen Reactions  . Ace Inhibitors Cough  . Keflex [Cephalexin] Diarrhea and Nausea And Vomiting  . Nitrofurantoin Diarrhea and Nausea And Vomiting  . Penicillins Hives, Rash and Other (See Comments)     Blisters on hands and feet    Family History  Problem Relation Age of Onset  . Hypertension Mother   . Diabetes Mother   . Heart failure Mother   . Dementia Mother   . Emphysema Father   . Hypertension Father   . Diabetes Brother   . GER disease Brother   . Cancer      family history   . Diabetes      family history   . Heart defect      famiily history   . Arthritis      family history   . Anesthesia problems Neg Hx   . Hypotension Neg Hx   . Malignant hyperthermia Neg Hx   . Pseudochol deficiency Neg Hx   . Colon cancer Neg Hx   . Hypertension Sister   . Hypertension Sister     Social History:  reports that she quit smoking about 20 years ago. Her smoking use included Cigarettes. She has a 20 pack-year smoking history. She has never used smokeless tobacco. She reports that she does not drink alcohol or use illicit drugs.  ROS: Positive-frequency, urgency, dysuria, bladder pain, intermittent hematuria, nausea, one episode of emesis Negative-no fever, chills, no flank or abdominal pain  Physical Exam:  Vital signs in last 24 hours: @VSRANGES @ General: Alert and oriented, mild distress HEENT: Normocephalic, atraumatic Neck: No JVD or lymphadenopathy Cardiovascular: Regular rate and rhythm Lungs: Clear bilaterally Abdomen: Soft, nontender, nondistended, no abdominal masses Back: No CVA tenderness Extremities: No edema Neurologic: Grossly intact  Laboratory Data:   Lab Results Last 24 Hours  No results found for this or any previous visit (from the past 24 hour(s)).   Recent Results (from the past 240 hour(s))  Urine culture Status: None (Preliminary result)   Collection Time: 07/10/15 8:40 PM  Result Value Ref Range Status   Specimen Description URINE, CATHETERIZED  Final   Special Requests NONE  Final   Culture   Final    NO  GROWTH < 24 HOURS Performed at [facility]    Report Status PENDING  Incomplete   Creatinine:  Recent Labs  07/10/15 1937  CREATININE 1.62*    Radiologic Imaging:  Imaging Results (Last 48 hours)    Ct Renal Stone Study  07/10/2015 CLINICAL DATA: Flank pain. Bilateral lower abdominal pain for 1 week. History of cystoscopy with stone extraction 06/28/2015, 12 days prior EXAM: CT ABDOMEN AND PELVIS WITHOUT CONTRAST TECHNIQUE: Multidetector CT imaging of the abdomen and pelvis was performed following the standard protocol without IV contrast. COMPARISON: Abdominal radiograph 06/07/2015, CT 01/15/2015 FINDINGS: Lower chest: Minimal linear scarring in the right middle and lower lobes. Lung bases otherwise clear. Stable elevation of right hemidiaphragm. Liver: No focal lesion allowing for lack contrast. Hepatobiliary: Gallbladder physiologically distended, no calcified stone. No biliary dilatation. Pancreas: No ductal dilatation or inflammation. Spleen: Normal. Adrenal glands: No nodule. Kidneys: Right ureteral stent, proximal portion in the renal pelvis, distal portion in the bladder. No stone fragments about the stent or within the ureter. Mild prominence of the right renal pelvis and proximal ureter with adjacent fat stranding. Small stones or stone fragments in the right lower, interpolar, and upper kidney measuring from 4-7 mm. No stones in the urinary bladder. Cortical scarring and multiple nonobstructing left renal calculi are unchanged. There is a 4 mm stone in the left pelvis, localizing to the distal left ureter, however no proximal hydroureteronephrosis. There is no left perinephric stranding. Stomach/Bowel: Stomach physiologically distended with ingested contents. There are no dilated or thickened small bowel loops. High-density material in the cecum is likely related to ingested contents. Small volume of stool throughout the colon without colonic wall thickening. Scattered  diverticulosis of the distal colon from the splenic flexure through the sigmoid, no diverticulitis. The appendix is normal. Vascular/Lymphatic: No retroperitoneal adenopathy. Abdominal aorta is normal in caliber. Mild atherosclerosis of the aorta without aneurysm. Reproductive: Uterus surgically absent. No adnexal mass. Bladder: Physiologically distended. No bladder stone. No stone fragments. No wall thickening. Other: No free air, free fluid, or intra-abdominal fluid collection. Small fat containing umbilical hernia. Musculoskeletal: There are no acute or suspicious osseous abnormalities. IMPRESSION: 1. Right ureteral stent in place. Mild prominence of the right renal pelvis and peripelvic soft tissue stranding, likely secondary to recent procedure. No stone fragments along the course of the ureteral stent. Nonobstructing stones/stone fragments remain in the right kidney, largest measuring 7 mm. 2. Apparent 4 mm stone in the distal left ureter, however no proximal hydroureteronephrosis or inflammatory changes about the left kidney. This is likely nonobstructing. Additional nonobstructing left renal calculi are seen. 3. Colonic diverticulosis without diverticulitis. Electronically Signed By: Jeb Levering M.D. On: 07/10/2015 20:29    Cath urine was obtained. This still looks mildly infected, but much better than the urinalysis from 2 days ago in the hospital. Urine culture was pending. Impression/Assessment:  1. Right renal calculi, status post ureteroscopy, holmium laser lithotripsy and stone extraction on 06/28/2015. She has a few residual fragments left in the right renal unit, but stent is adequately decompressing that side  2. Small left distal  ureteral stone, nonobstructing, patient does not seemingly have symptoms from this  3. UTI, currently on Cipro, although she has only taken 2 doses over the past 3 days  Plan:  1. She will be admitted for pain management as well as IV  antibiotics  2. Hydration  3. I will contact the hospital service about hopefully taking over her care while she is here. I think a couple of days of antibiotic management, especially tailoring this to her culture, which will hopefully be back tomorrow, would be appropriate. I do not think at this point she has significant symptoms from her left distal ureteral stone and we can watch that area she might need stenting if her condition changes significantly  Jorja Loa 07/12/2015, 11:46 AM  Lillette Boxer. Shirla Hodgkiss MD

## 2015-08-21 NOTE — Discharge Instructions (Signed)
Dr. Alan Ripper office will call you to set up a follow-up appointment  Once your initial antibiotic is completed, pick up the trimethoprim, which you should take once daily until Dr. Diona Fanti tells you to quit  If you have worsening of any urinary issue before you see Dr. Diona Fanti, call the office

## 2015-08-21 NOTE — Progress Notes (Signed)
2 Days Post-Op Subjective: Patient reports that she is feeling better. Appetite is good. She has a bit of nausea. No flank pain, she is having no stent discomfort.  Objective: Vital signs in last 24 hours: Temp:  [98.3 F (36.8 C)-99.3 F (37.4 C)] 98.3 F (36.8 C) (03/12 0430) Pulse Rate:  [101-107] 107 (03/12 0430) Resp:  [20] 20 (03/12 0430) BP: (118-127)/(58-78) 118/64 mmHg (03/12 0430) SpO2:  [91 %-94 %] 93 % (03/12 0430)  Intake/Output from previous day: 03/11 0701 - 03/12 0700 In: -  Out: 1500 [Urine:1500] Intake/Output this shift: Total I/O In: 240 [P.O.:240] Out: 400 [Urine:400]  Physical Exam:  Constitutional: Vital signs reviewed. WD WN in NAD. She is talkative, comfortable Eyes: PERRL, No scleral icterus.   Cardiovascular: RRR Pulmonary/Chest: Normal effort Abdominal: Soft. Non-tender, non-distended, bowel sounds are normal, no masses, organomegaly, or guarding present.    Lab Results:  Recent Labs  08/19/15 0718 08/20/15 0704 08/21/15 0655  HGB 8.7* 8.5* 8.5*  HCT 27.4* 27.6* 28.0*   BMET  Recent Labs  08/20/15 0704 08/21/15 0655  NA 138 139  K 3.9 3.8  CL 105 107  CO2 25 25  GLUCOSE 117* 110*  BUN 12 10  CREATININE 1.69* 1.41*  CALCIUM 9.2 9.3   No results for input(s): LABPT, INR in the last 72 hours. No results for input(s): LABURIN in the last 72 hours. Results for orders placed or performed during the hospital encounter of 08/18/15  Urine culture     Status: None   Collection Time: 08/18/15  6:00 PM  Result Value Ref Range Status   Specimen Description Urine  Final   Special Requests NONE  Final   Culture   Final    MULTIPLE SPECIES PRESENT, SUGGEST RECOLLECTION Performed at [facility]    Report Status 08/20/2015 FINAL  Final  MRSA PCR Screening     Status: None   Collection Time: 08/19/15  4:55 PM  Result Value Ref Range Status   MRSA by PCR NEGATIVE NEGATIVE Final    Comment:        The GeneXpert MRSA Assay  (FDA approved for NASAL specimens only), is one component of a comprehensive MRSA colonization surveillance program. It is not intended to diagnose MRSA infection nor to guide or monitor treatment for MRSA infections.     Studies/Results:  Urine culture is negative. Creatinine is trending down.  Assessment/Plan:   Postoperative day #2 placement of right ureteral stent for obstruction/ureteral stricture/small ureteral stone. She seems to be doing better.    I agree with oral Ceftin. I would give her 7 days of home antibiotics, followed by a suppressive dose of trimethoprim-I sent that prescription in    She has an appointment with me on Tuesday, which I do not think she needs to keep, as she will be going home, more than likely, tomorrow. We will call her to set up an appropriate follow-up. She will eventually need cystoscopy, right retrograde, balloon dilation of stricture, ureteroscopy.     Jorja Loa 08/21/2015, 12:30 PM

## 2015-08-22 DIAGNOSIS — N183 Chronic kidney disease, stage 3 (moderate): Secondary | ICD-10-CM | POA: Diagnosis not present

## 2015-08-22 DIAGNOSIS — W19XXXD Unspecified fall, subsequent encounter: Secondary | ICD-10-CM | POA: Diagnosis not present

## 2015-08-22 DIAGNOSIS — N200 Calculus of kidney: Secondary | ICD-10-CM | POA: Diagnosis not present

## 2015-08-22 DIAGNOSIS — E1121 Type 2 diabetes mellitus with diabetic nephropathy: Secondary | ICD-10-CM | POA: Diagnosis not present

## 2015-08-22 DIAGNOSIS — N12 Tubulo-interstitial nephritis, not specified as acute or chronic: Secondary | ICD-10-CM | POA: Diagnosis not present

## 2015-08-22 DIAGNOSIS — E669 Obesity, unspecified: Secondary | ICD-10-CM

## 2015-08-22 LAB — BASIC METABOLIC PANEL
Anion gap: 7 (ref 5–15)
BUN: 9 mg/dL (ref 6–20)
CO2: 25 mmol/L (ref 22–32)
Calcium: 9.6 mg/dL (ref 8.9–10.3)
Chloride: 107 mmol/L (ref 101–111)
Creatinine, Ser: 1.4 mg/dL — ABNORMAL HIGH (ref 0.44–1.00)
GFR calc Af Amer: 43 mL/min — ABNORMAL LOW (ref >60)
GFR calc non Af Amer: 37 mL/min — ABNORMAL LOW (ref >60)
Glucose, Bld: 102 mg/dL — ABNORMAL HIGH (ref 65–99)
Potassium: 3.7 mmol/L (ref 3.5–5.1)
Sodium: 139 mmol/L (ref 135–145)

## 2015-08-22 LAB — CBC
HCT: 28.6 % — ABNORMAL LOW (ref 36.0–46.0)
Hemoglobin: 8.8 g/dL — ABNORMAL LOW (ref 12.0–15.0)
MCH: 22.7 pg — ABNORMAL LOW (ref 26.0–34.0)
MCHC: 30.8 g/dL (ref 30.0–36.0)
MCV: 73.9 fL — ABNORMAL LOW (ref 78.0–100.0)
Platelets: 613 10*3/uL — ABNORMAL HIGH (ref 150–400)
RBC: 3.87 MIL/uL (ref 3.87–5.11)
RDW: 19.2 % — ABNORMAL HIGH (ref 11.5–15.5)
WBC: 10.3 10*3/uL (ref 4.0–10.5)

## 2015-08-22 LAB — GLUCOSE, CAPILLARY
Glucose-Capillary: 100 mg/dL — ABNORMAL HIGH (ref 65–99)
Glucose-Capillary: 89 mg/dL (ref 65–99)
Glucose-Capillary: 119 mg/dL — ABNORMAL HIGH (ref 65–99)

## 2015-08-22 MED ORDER — CEFUROXIME AXETIL 500 MG PO TABS: 500.0000 mg | ORAL_TABLET | Freq: Two times a day (BID) | ORAL | Status: DC

## 2015-08-22 MED ORDER — IRON 325 (65 FE) MG PO TABS: 1.0000 | ORAL_TABLET | Freq: Every day | ORAL | Status: DC

## 2015-08-22 MED ORDER — LACTULOSE 10 GM/15ML PO SOLN: 20.0000 g | Freq: Every day | ORAL | Status: DC | PRN

## 2015-08-22 MED ORDER — FERUMOXYTOL INJECTION 510 MG/17 ML
510.0000 mg | Freq: Once | INTRAVENOUS | Status: AC
  Administered 2015-08-22: 510 mg via INTRAVENOUS
  Filled 2015-08-22: qty 17

## 2015-08-22 NOTE — Discharge Summary (Signed)
Physician Discharge Summary  Cheryl Abbott [id number] DOB: [birth year] DOA: 08/18/2015  PCP: Tula Nakayama, MD  Admit date: 08/18/2015 Discharge date: 08/22/2015  Time spent: 35 minutes  Recommendations for Outpatient Follow-up:  Per urology will need cystoscopy, right retrograde, balloon dilation of stricture, ureteroscopy- reschedule appointment Outpatient colonoscopy   Discharge Diagnoses:  Principal Problem:   Fall Active Problems:   Type 2 diabetes with nephropathy ([place])   Hypertension goal BP (blood pressure) < 130/80   RENAL CALCULUS   Obesity (BMI 30.0-34.9)   CKD (chronic kidney disease) stage 3, GFR 30-59 ml/min   Urinary tract infection, site not specified   Cerebral microvascular disease   Generalized weakness   Discharge Condition: improved  Diet recommendation: diabetic diet  Filed Weights   08/18/15 1550 08/19/15 0056  Weight: 83.008 kg (183 lb) 82.827 kg (182 lb 9.6 oz)    History of present illness:  70 year old female who  has a past medical history of Anxiety disorder; Hyperlipidemia; Hypertension; Depression; Chronic back pain; Headache; Type 2 diabetes mellitus ([place]); GERD (gastroesophageal reflux disease); Anemia; History of adenomatous polyp of colon; Fatty liver; Sigmoid diverticulosis; Left ureteral stone; History of kidney stones; Arthritis; Hypercalcemia; OSA treated with BiPAP; Nephrolithiasis; Sciatica; Lumbar disc disease with radiculopathy; and Neuropathy, peripheral ([place]). Today presents to the hospital with generalized weakness for more the one-day duration. Patient says that today she fell while trying to do laundry and was too weak to get up. Imaging studies done in the ED including the CT of the head and x-ray of the knees show no significant abnormality. Patient is currently seen by urology for bilateral kidney stone and underwent urologic procedure 2 weeks ago. Patient was put on Cipro for UTI which she has been taking everyday. Also  patient complains of vomiting every night for past 1 week. Patient today was supposed to get a renal ultrasound which showed right-sided hydronephrosis with bilateral kidney stones. UA was abnormal and patient started on ceftriaxone. She denies chest pain or shortness of breath. Denies dysuria urgency or frequency of urination. Denies hematuria. No abdominal pain.  Hospital Course:  Fall/generalized weakness secondary to possible complicated UTI - Physical therapy evaluation re-eval requested: home health, rolling walker - Urinalysis findings concerning for infection, but specimen was contaminated.  -change rocephin to PO abx ceftin and treat for total of 7 days - urine culture- with multiple species (was on cipro as outpatient recently) -no further fevers  Anemia - Fe def as has low MCV and has h/o - IV fe and Po replacement upon d/c -check POC- not done in hospital despite 2 BMs per patient-- due for outpatient colonoscopy- patient instructed to have this  Leukocytosis -WBC trending down   Type 2 diabetes with nephropathy ([facility]) - blood sugars controlled on diabetic diet   Hypertension goal BP (blood pressure) < 130/80 - stable   RENAL CALCULUS/right-sided hydronephrosis - Chronic problem. - s/p Cystoscopy, right retrograde ureteropyelogram, interpretive fluoroscopy, double-J stent placement-6 French by 24 cm without tether Will need: need cystoscopy, right retrograde, balloon dilation of stricture, ureteroscopy.   Obesity (BMI 30.0-34.9) - Heart healthy diet.   CKD (chronic kidney disease) stage 3, GFR 30-59 ml/min - Baseline creatinine appears to be about 1-1.25. Creatinine has risen since 07/20/15. - Patient is allergic to ace inhibitors.  - CKD likely from hypertension/DM with acute worsening secondary to obstructive uropathy.   Cerebral microvascular disease - Patient should likely be on aspirin therapy- start after colonoscopy    Procedures:  placement  of  right ureteral stent for obstruction/ureteral stricture/small ureteral stone  Consultations:  urology  Discharge Exam: Filed Vitals:   08/21/15 2208 08/22/15 0649  BP: 135/76 131/62  Pulse: 93 101  Temp: 99.4 F (37.4 C) 98.9 F (37.2 C)  Resp: 20 20    General: awake, NAD Cardiovascular: rrr Respiratory: clear  Discharge Instructions   Discharge Instructions    Diet Carb Modified    Complete by:  As directed      Discharge instructions    Complete by:  As directed   Home health Outpatient colonoscopy Urology follow up [place] can be constipating, so please be sure to have bowel regimen for daily BMs     Increase activity slowly    Complete by:  As directed           Current Discharge Medication List    START taking these medications   Details  cefUROXime (CEFTIN) 500 MG tablet Take 1 tablet (500 mg total) by mouth 2 (two) times daily with a meal. Qty: 8 tablet, Refills: 0    Ferrous Sulfate (IRON) 325 (65 Fe) MG TABS Take 1 tablet by mouth daily. Qty: 30 each, Refills: 0      CONTINUE these medications which have NOT CHANGED   Details  ALPRAZolam (XANAX) 0.5 MG tablet TAKE 1 TABLET BY MOUTH TWICE A DAY Qty: 180 tablet, Refills: 0    docusate sodium (COLACE) 100 MG capsule Take 200 mg by mouth at bedtime.     gabapentin (NEURONTIN) 100 MG capsule TAKE 1 CAPSULE (100 MG TOTAL) BY MOUTH AT BEDTIME. Qty: 30 capsule, Refills: 4    lovastatin (MEVACOR) 40 MG tablet TAKE 2 TABLETS (80 MG TOTAL) BY MOUTH AT BEDTIME. Qty: 180 tablet, Refills: 1    pantoprazole (PROTONIX) 40 MG tablet TAKE 1 TABLET BY MOUTH TWICE A DAY Qty: 60 tablet, Refills: 5    PARoxetine (PAXIL) 40 MG tablet TAKE 1 TABLET (40 MG TOTAL) BY MOUTH EVERY MORNING. Qty: 90 tablet, Refills: 0    Vitamin D, Ergocalciferol, (DRISDOL) 50000 UNITS CAPS capsule TAKE 1 CAPSULE (50,000 UNITS TOTAL) BY MOUTH EVERY 7 (SEVEN) DAYS. Qty: 4 capsule, Refills: 5    HYDROcodone-acetaminophen (NORCO/VICODIN)  5-325 MG tablet Take 1 tablet by mouth every 6 (six) hours as needed for severe pain. Qty: 6 tablet, Refills: 0    oxybutynin (DITROPAN) 5 MG tablet Take 1 tablet (5 mg total) by mouth every 8 (eight) hours as needed for bladder spasms. Qty: 30 tablet, Refills: 1      STOP taking these medications     amLODipine (NORVASC) 10 MG tablet      metFORMIN (GLUCOPHAGE) 500 MG tablet      potassium citrate (UROCIT-K) 10 MEQ (1080 MG) SR tablet      ciprofloxacin (CIPRO) 500 MG tablet      diazepam (VALIUM) 5 MG tablet        Allergies  Allergen Reactions  . Ace Inhibitors Cough  . Keflex [Cephalexin] Diarrhea and Nausea And Vomiting  . Nitrofurantoin Diarrhea and Nausea And Vomiting  . Penicillins Hives, Rash and Other (See Comments)    Blisters on hands and feet Has patient had a PCN reaction causing immediate rash, facial/tongue/throat swelling, SOB or lightheadedness with hypotension: Yes Has patient had a PCN reaction causing severe rash involving mucus membranes or skin necrosis: No Has patient had a PCN reaction that required hospitalization No Has patient had a PCN reaction occurring within the last 10 years: No If  all of the above answers are "NO", then may proceed with Cephalosporin use.    Follow-up Information    Follow up with DAHLSTEDT, Lillette Boxer, MD.   Specialty:  Urology   Why:  We will call to set up an appointment   Contact information:   [address] [place] [address] [phone]        The results of significant diagnostics from this hospitalization (including imaging, microbiology, ancillary and laboratory) are listed below for reference.    Significant Diagnostic Studies: Dg Chest 1 View  08/06/2015  CLINICAL DATA:  Chest pain and weakness.  Leukocytosis. EXAM: CHEST 1 VIEW COMPARISON:  November 26, 2013 FINDINGS: The heart, hila, mediastinum, lungs, and pleura are normal. IMPRESSION: No active disease. Electronically Signed   By: Dorise Bullion III M.D   On: 08/06/2015 14:46   Dg Chest 2 View  08/18/2015  CLINICAL DATA:  Fever.  Fall EXAM: CHEST  2 VIEW COMPARISON:  08/06/2015 FINDINGS: Elevated right hemidiaphragm with right lower lobe scarring unchanged from the prior study. Negative for heart failure. No edema or effusion. Negative for mass or pneumonia. IMPRESSION: No active cardiopulmonary disease. Electronically Signed   By: Franchot Gallo M.D.   On: 08/18/2015 18:49   Dg Lumbar Spine Complete  08/06/2015  CLINICAL DATA:  70 year old female with acute back pain following falls over the last 2 days. Initial encounter. EXAM: LUMBAR SPINE - COMPLETE 4+ VIEW COMPARISON:  07/26/2015 abdominal CT and prior exams. FINDINGS: No evidence of fracture or subluxation. Mild multilevel degenerative disc disease and spondylosis noted. Facet arthropathy in the lumbar spine again identified. No focal bony lesions or spondylolysis identified. IMPRESSION: No acute abnormality. Degenerative changes as described. Electronically Signed   By: Margarette Canada M.D.   On: 08/06/2015 14:08   Ct Head Wo Contrast  08/18/2015  CLINICAL DATA:  Post fall at home hitting posterior aspect of the head. EXAM: CT HEAD WITHOUT CONTRAST TECHNIQUE: Contiguous axial images were obtained from the base of the skull through the vertex without intravenous contrast. COMPARISON:  05/10/2013; brain MRI -11/27/2011 FINDINGS: There is a potential minimal amount of subcutaneous stranding about the high right posterior parietal calvaria (image 32, series 2) without associated displaced calvarial fracture or radiopaque foreign body. Similar findings of centralized atrophy with mild ex vacuo dilatation of the ventricular system. Scattered minimal periventricular hypodensities compatible microvascular ischemic disease. The gray-white differentiation is otherwise well maintained without CT evidence of acute large territory infarct. No intraparenchymal or extra-axial mass or hemorrhage.  Unchanged size a configuration of the ventricles and basilar cisterns. No midline shift. Intracranial atherosclerosis. Limited visualization the paranasal sinuses and mastoid air cells is normal. IMPRESSION: 1. Very minimal amount of soft tissue stranding about the high right posterior parietal calvarium without associated displaced calvarial fracture, radiopaque foreign body or acute intracranial process. 2. Similar findings of centralized atrophy and microvascular ischemic disease. Electronically Signed   By: Sandi Mariscal M.D.   On: 08/18/2015 17:30   US Renal  08/18/2015  CLINICAL DATA:  Ureteral calculus. EXAM: RENAL / URINARY TRACT ULTRASOUND COMPLETE COMPARISON:  CT 07/26/2015. FINDINGS: Right Kidney: Length: 13.6 cm. Echogenicity within normal limits. No mass. Prominent right hydronephrosis again noted. Probable right nephrolithiasis. Left Kidney: Length: 13.2 cm. Echogenicity within normal limits. No mass or hydronephrosis visualized. Probable left nephrolithiasis. Bladder: Appears normal for degree of bladder distention. No ureteral jets identified. Patient refused postvoid exam. IMPRESSION: Prominent right hydronephrosis again noted. Bilateral nephrolithiasis. Electronically  Signed   ByMarcello Moores  Register   On: 08/18/2015 11:00   Dg Knee Complete 4 Views Left  08/18/2015  CLINICAL DATA:  Fall.  Bilateral hip and knee pain EXAM: LEFT KNEE - COMPLETE 4+ VIEW COMPARISON:  07/24/2004 FINDINGS: There is no evidence of fracture, dislocation, or joint effusion. There is no evidence of arthropathy or other focal bone abnormality. Soft tissues are unremarkable. IMPRESSION: Negative. Electronically Signed   By: Franchot Gallo M.D.   On: 08/18/2015 17:01   Dg Knee Complete 4 Views Right  08/18/2015  CLINICAL DATA:  Bilateral hip and bilateral knee pain after falling while taking clothes out of her dryer today. PT states she has had several falls in the past. EXAM: RIGHT KNEE - COMPLETE 4+ VIEW COMPARISON:  None.  FINDINGS: There is no evidence of fracture, dislocation, or joint effusion. There is generalized osteopenia. Soft tissues are unremarkable. IMPRESSION: No acute osseous injury of the right knee. Electronically Signed   By: Kathreen Devoid   On: 08/18/2015 17:01   Dg C-arm 1-60 Min-no Report  08/22/2015  CLINICAL DATA: right ureteral calculus  C-ARM 1-60 MINUTES Fluoroscopy was utilized by the requesting physician.  No radiographic interpretation.   Dg C-arm 1-60 Min-no Report  08/02/2015  CLINICAL DATA: BIL URETERAL CAL C-ARM 1-60 MINUTES Fluoroscopy was utilized by the requesting physician.  No radiographic interpretation.   Ct Renal Stone Study  08/19/2015  CLINICAL DATA:  Evaluate for ureteral calculi, lower abdominal pain for 2 days EXAM: CT ABDOMEN AND PELVIS WITHOUT CONTRAST TECHNIQUE: Multidetector CT imaging of the abdomen and pelvis was performed following the standard protocol without IV contrast. COMPARISON:  07/26/2015 FINDINGS: Lower chest: Lung bases shows mild atelectasis in left lower lobe posteriorly. Hepatobiliary: Unenhanced liver shows no biliary ductal dilatation. No calcified gallstones are noted within gallbladder. No CBD dilatation. Pancreas: Unenhanced pancreas is unremarkable. Spleen: Unenhanced spleen is stable in size grossly unremarkable. Adrenals/Urinary Tract: No adrenal gland mass is noted. There is moderate right hydronephrosis and right hydroureter with slight progression from prior exam. There is right perinephric and mild right periureteral stranding. Axial image 78 there is 4.5 mm calcified obstructive calculus in distal right ureter about 4.5 cm from right UVJ. At least 2 punctate calcified nonobstructive calculi are noted within right kidney the largest in upper pole measures 2 mm. Multiple punctate nonobstructive calcified calculi are noted within left kidney the largest in lower pole measures 3.7 mm. No left hydronephrosis. No left ureteral calculi. Bilateral distal  ureter is unremarkable. No calcified calculi are noted within urinary bladder. Stomach/Bowel: No gastric outlet obstruction. No small bowel obstruction. Normal appendix is noted in axial image 69. No pericecal inflammation. The terminal ileum is unremarkable. No distal colonic obstruction. Some colonic gas noted within rectum. Sigmoid colon diverticula are noted with no evidence of acute diverticulitis. Scattered diverticula are noted descending colon. No evidence of acute diverticulitis. Vascular/Lymphatic: No aortic aneurysm. Mild atherosclerotic calcifications of distal abdominal aorta and and common iliac arteries. No mesenteric or retroperitoneal adenopathy. Reproductive: The patient is status post hysterectomy. Other: No ascites or free air. Tiny umbilical hernia containing fat without evidence of acute complication. There is no inguinal adenopathy. Musculoskeletal: Mild degenerative changes lower thoracic spine. Minimal degenerative changes lumbar spine. No destructive bony lesions are noted within pelvis. Mild degenerative changes pubic symphysis. Mild degenerative changes right SI joint. IMPRESSION: 1. There is moderate right hydronephrosis and right hydroureter with progression from prior exam. Significant right perinephric stranding. 2. Axial image  78 there is 4.5 mm calcified obstructive calculus in distal right ureter about 4.5 cm from right UVJ. 3. Bilateral nonobstructive nephrolithiasis. 4. No left hydronephrosis or ureteral calculi. No calcified calculi are noted within urinary bladder. 5. Normal appendix.  No pericecal inflammation. 6. No small bowel obstruction. 7. Status post hysterectomy. Electronically Signed   By: Lahoma Crocker M.D.   On: 08/19/2015 12:45   Ct Renal Stone Study  07/26/2015  CLINICAL DATA:  Two week history of right-sided abdominal pain EXAM: CT ABDOMEN AND PELVIS WITHOUT CONTRAST TECHNIQUE: Multidetector CT imaging of the abdomen and pelvis was performed following the standard  protocol without IV contrast. COMPARISON:  07/10/2015. FINDINGS: Lower chest:  Unremarkable. Hepatobiliary: No focal abnormality in the liver on this study without intravenous contrast. No evidence of hepatomegaly. There is no evidence for gallstones, gallbladder wall thickening, or pericholecystic fluid. No intrahepatic or extrahepatic biliary dilation. Pancreas: No focal mass lesion. No dilatation of the main duct. No intraparenchymal cyst. No peripancreatic edema. Spleen: No splenomegaly. No focal mass lesion. Adrenals/Urinary Tract: No adrenal nodule or mass. 4 mm nonobstructing stone identified upper pole right kidney. Several other tiny 1 mm stones are seen in the right kidney. There is moderate right hydronephrosis with right ureter dilated to the level of the right pelvic sidewall (distal to the iliac vessels) where a 10 x 4 x 5 mm ureteral stone is identified. Approximately 8 stones are seen in the left kidney ranging in size from 1-3 mm. No left-sided hydronephrosis. Although the left ureter is not dilated, a 4 x 4 x 3 mm stone is identified in the distal left ureter about 3 cm proximal to the UVJ. Urinary bladder is decompressed by a Foley catheter with multiple small bladder stones evident. There may be a stone adjacent to the catheter in the proximal urethra (see image 81 series 2). Stomach/Bowel: Stomach is nondistended. No gastric wall thickening. No evidence of outlet obstruction. Duodenum is normally positioned as is the ligament of Treitz. No small bowel wall thickening. No small bowel dilatation. The terminal ileum is normal. The appendix is normal. Diverticular changes are noted in the left colon without evidence of diverticulitis. Vascular/Lymphatic: There is abdominal aortic atherosclerosis without aneurysm. There is no gastrohepatic or hepatoduodenal ligament lymphadenopathy. No intraperitoneal or retroperitoneal lymphadenopathy. No pelvic sidewall lymphadenopathy. Reproductive: Uterus is  surgically absent. There is no adnexal mass. Other: No intraperitoneal free fluid. Musculoskeletal: Bone windows reveal no worrisome lytic or sclerotic osseous lesions. IMPRESSION: 1. Bilateral nephrolithiasis with moderate right hydroureteronephrosis secondary to a 10 x 4 x 5 mm distal right ureteral stone. 4 x 4 x 3 mm stone identified distal left ureter down hydroureteronephrosis. 2. Bladder stones with possible ureteral stone adjacent Foley catheter. 3. Colonic diverticulosis without diverticulitis. Electronically Signed   By: Misty Stanley M.D.   On: 07/26/2015 12:12   Dg Hips Bilat With Pelvis 3-4 Views  08/18/2015  CLINICAL DATA:  Fall.  Bilateral hip pain EXAM: DG HIP (WITH OR WITHOUT PELVIS) 3-4V BILAT COMPARISON:  08/06/2015 FINDINGS: There is no evidence of hip fracture or dislocation. There is no evidence of arthropathy or other focal bone abnormality. IMPRESSION: Negative. Electronically Signed   By: Franchot Gallo M.D.   On: 08/18/2015 17:07   Dg Hips Bilat With Pelvis 3-4 Views  08/06/2015  CLINICAL DATA:  Fall with bilateral hip pain, left side greater than right. EXAM: DG HIP (WITH OR WITHOUT PELVIS) 3-4V BILAT COMPARISON:  06/07/2015 FINDINGS: Pelvic bony ring is intact.  SI joints are symmetric. Both hips are located without a fracture. No significant joint space narrowing in the hip joints. Stable focal sclerotic area at the junction of the right femoral head and neck. IMPRESSION: No acute bone abnormality to the pelvis or hips. Electronically Signed   By: Markus Daft M.D.   On: 08/06/2015 13:49    Microbiology: Recent Results (from the past 240 hour(s))  Urine culture     Status: None   Collection Time: 08/18/15  6:00 PM  Result Value Ref Range Status   Specimen Description Urine  Final   Special Requests NONE  Final   Culture   Final    MULTIPLE SPECIES PRESENT, SUGGEST RECOLLECTION Performed at [facility]    Report Status 08/20/2015 FINAL  Final  MRSA PCR Screening      Status: None   Collection Time: 08/19/15  4:55 PM  Result Value Ref Range Status   MRSA by PCR NEGATIVE NEGATIVE Final    Comment:        The GeneXpert MRSA Assay (FDA approved for NASAL specimens only), is one component of a comprehensive MRSA colonization surveillance program. It is not intended to diagnose MRSA infection nor to guide or monitor treatment for MRSA infections.      Labs: Basic Metabolic Panel:  Recent Labs Lab 08/18/15 1711 08/19/15 0718 08/20/15 0704 08/21/15 0655 08/22/15 0749  NA 131* 137 138 139 139  K 4.2 4.2 3.9 3.8 3.7  CL 98* 102 105 107 107  CO2 25 24 25 25 25   GLUCOSE 119* 110* 117* 110* 102*  BUN 16 13 12 10 9   CREATININE 1.69* 1.69* 1.69* 1.41* 1.40*  CALCIUM 9.5 9.6 9.2 9.3 9.6   Liver Function Tests:  Recent Labs Lab 08/19/15 0718  AST 17  ALT 9*  ALKPHOS 57  BILITOT 0.3  PROT 8.5*  ALBUMIN 3.0*   No results for input(s): LIPASE, AMYLASE in the last 168 hours. No results for input(s): AMMONIA in the last 168 hours. CBC:  Recent Labs Lab 08/18/15 1711 08/19/15 0718 08/20/15 0704 08/21/15 0655 08/22/15 0749  WBC 15.6* 17.4* 15.2* 11.7* 10.3  NEUTROABS 12.4*  --   --   --   --   HGB 8.5* 8.7* 8.5* 8.5* 8.8*  HCT 27.6* 27.4* 27.6* 28.0* 28.6*  MCV 73.0* 72.7* 73.2* 73.3* 73.9*  PLT 534* 561* 561* 543* 613*   Cardiac Enzymes: No results for input(s): CKTOTAL, CKMB, CKMBINDEX, TROPONINI in the last 168 hours. BNP: BNP (last 3 results) No results for input(s): BNP in the last 8760 hours.  ProBNP (last 3 results) No results for input(s): PROBNP in the last 8760 hours.  CBG:  Recent Labs Lab 08/21/15 0829 08/21/15 1104 08/21/15 1548 08/21/15 2043 08/22/15 0745  GLUCAP 101* 100* 78 80 89       Signed:  [facility] 08/22/2015, 9:13 AM

## 2015-08-22 NOTE — Progress Notes (Signed)
Pt's IV catheter removed and intact. Pt's IV site clean dry and intact. Discharge instructions including medications and follow up appointments reviewed and discussed with patient. Pt verbalized understanding of discharge instructions and medications. All questions were answered and no further questions at this time. Pt in stable condition and in no acute distress at this time. Pt escorted by nurse tech.

## 2015-08-23 ENCOUNTER — Ambulatory Visit: Payer: Medicare Other | Admitting: Urology

## 2015-08-23 ENCOUNTER — Encounter (HOSPITAL_COMMUNITY): Payer: Self-pay | Admitting: Urology

## 2015-08-24 ENCOUNTER — Encounter: Payer: Medicare Other | Admitting: Family Medicine

## 2015-08-24 ENCOUNTER — Other Ambulatory Visit: Payer: Self-pay | Admitting: Family Medicine

## 2015-08-24 DIAGNOSIS — E1122 Type 2 diabetes mellitus with diabetic chronic kidney disease: Secondary | ICD-10-CM | POA: Diagnosis not present

## 2015-08-24 DIAGNOSIS — N183 Chronic kidney disease, stage 3 (moderate): Secondary | ICD-10-CM | POA: Diagnosis not present

## 2015-08-24 DIAGNOSIS — Z9181 History of falling: Secondary | ICD-10-CM | POA: Diagnosis not present

## 2015-08-24 DIAGNOSIS — E1140 Type 2 diabetes mellitus with diabetic neuropathy, unspecified: Secondary | ICD-10-CM | POA: Diagnosis not present

## 2015-08-24 DIAGNOSIS — D649 Anemia, unspecified: Secondary | ICD-10-CM | POA: Diagnosis not present

## 2015-08-24 DIAGNOSIS — Z7984 Long term (current) use of oral hypoglycemic drugs: Secondary | ICD-10-CM | POA: Diagnosis not present

## 2015-08-24 DIAGNOSIS — Z9981 Dependence on supplemental oxygen: Secondary | ICD-10-CM | POA: Diagnosis not present

## 2015-08-24 DIAGNOSIS — N390 Urinary tract infection, site not specified: Secondary | ICD-10-CM | POA: Diagnosis not present

## 2015-08-24 DIAGNOSIS — Z87442 Personal history of urinary calculi: Secondary | ICD-10-CM | POA: Diagnosis not present

## 2015-08-24 DIAGNOSIS — I129 Hypertensive chronic kidney disease with stage 1 through stage 4 chronic kidney disease, or unspecified chronic kidney disease: Secondary | ICD-10-CM | POA: Diagnosis not present

## 2015-08-24 DIAGNOSIS — K219 Gastro-esophageal reflux disease without esophagitis: Secondary | ICD-10-CM | POA: Diagnosis not present

## 2015-08-25 ENCOUNTER — Other Ambulatory Visit: Payer: Self-pay

## 2015-08-25 ENCOUNTER — Telehealth: Payer: Self-pay

## 2015-08-25 NOTE — Telephone Encounter (Signed)
Noted, pls remove them from her med list, thanks

## 2015-08-25 NOTE — Telephone Encounter (Signed)
Deleted from med list.

## 2015-08-26 ENCOUNTER — Telehealth: Payer: Self-pay

## 2015-08-26 ENCOUNTER — Telehealth: Payer: Self-pay | Admitting: Family Medicine

## 2015-08-26 DIAGNOSIS — E1122 Type 2 diabetes mellitus with diabetic chronic kidney disease: Secondary | ICD-10-CM | POA: Diagnosis not present

## 2015-08-26 DIAGNOSIS — D649 Anemia, unspecified: Secondary | ICD-10-CM | POA: Diagnosis not present

## 2015-08-26 DIAGNOSIS — Z9981 Dependence on supplemental oxygen: Secondary | ICD-10-CM | POA: Diagnosis not present

## 2015-08-26 DIAGNOSIS — I129 Hypertensive chronic kidney disease with stage 1 through stage 4 chronic kidney disease, or unspecified chronic kidney disease: Secondary | ICD-10-CM | POA: Diagnosis not present

## 2015-08-26 DIAGNOSIS — Z7984 Long term (current) use of oral hypoglycemic drugs: Secondary | ICD-10-CM | POA: Diagnosis not present

## 2015-08-26 DIAGNOSIS — Z87442 Personal history of urinary calculi: Secondary | ICD-10-CM | POA: Diagnosis not present

## 2015-08-26 DIAGNOSIS — N390 Urinary tract infection, site not specified: Secondary | ICD-10-CM | POA: Diagnosis not present

## 2015-08-26 DIAGNOSIS — Z9181 History of falling: Secondary | ICD-10-CM | POA: Diagnosis not present

## 2015-08-26 DIAGNOSIS — N183 Chronic kidney disease, stage 3 (moderate): Secondary | ICD-10-CM | POA: Diagnosis not present

## 2015-08-26 DIAGNOSIS — K219 Gastro-esophageal reflux disease without esophagitis: Secondary | ICD-10-CM | POA: Diagnosis not present

## 2015-08-26 DIAGNOSIS — E1140 Type 2 diabetes mellitus with diabetic neuropathy, unspecified: Secondary | ICD-10-CM | POA: Diagnosis not present

## 2015-08-26 MED ORDER — PANTOPRAZOLE SODIUM 40 MG PO TBEC: 40.0000 mg | DELAYED_RELEASE_TABLET | Freq: Two times a day (BID) | ORAL | Status: DC

## 2015-08-26 MED ORDER — ONDANSETRON HCL 4 MG PO TABS: 4.0000 mg | ORAL_TABLET | Freq: Three times a day (TID) | ORAL | Status: DC | PRN

## 2015-08-26 NOTE — Telephone Encounter (Signed)
Eudell is calling requesting that her medications be sent to [facility] she needs a refill, on voicemail she never stated which medications she needed

## 2015-08-26 NOTE — Telephone Encounter (Signed)
Patient and nurse made aware.

## 2015-08-26 NOTE — Telephone Encounter (Signed)
This ws the only med that needed refills the others have refills

## 2015-08-26 NOTE — Telephone Encounter (Signed)
Medication sent in to [facility], pls let pt / nurse know

## 2015-08-30 ENCOUNTER — Ambulatory Visit: Payer: Medicare Other | Admitting: Urology

## 2015-08-30 DIAGNOSIS — K219 Gastro-esophageal reflux disease without esophagitis: Secondary | ICD-10-CM | POA: Diagnosis not present

## 2015-08-30 DIAGNOSIS — I129 Hypertensive chronic kidney disease with stage 1 through stage 4 chronic kidney disease, or unspecified chronic kidney disease: Secondary | ICD-10-CM | POA: Diagnosis not present

## 2015-08-30 DIAGNOSIS — Z7984 Long term (current) use of oral hypoglycemic drugs: Secondary | ICD-10-CM | POA: Diagnosis not present

## 2015-08-30 DIAGNOSIS — Z9181 History of falling: Secondary | ICD-10-CM | POA: Diagnosis not present

## 2015-08-30 DIAGNOSIS — Z9981 Dependence on supplemental oxygen: Secondary | ICD-10-CM | POA: Diagnosis not present

## 2015-08-30 DIAGNOSIS — Z87442 Personal history of urinary calculi: Secondary | ICD-10-CM | POA: Diagnosis not present

## 2015-08-30 DIAGNOSIS — E1122 Type 2 diabetes mellitus with diabetic chronic kidney disease: Secondary | ICD-10-CM | POA: Diagnosis not present

## 2015-08-30 DIAGNOSIS — E1140 Type 2 diabetes mellitus with diabetic neuropathy, unspecified: Secondary | ICD-10-CM | POA: Diagnosis not present

## 2015-08-30 DIAGNOSIS — N390 Urinary tract infection, site not specified: Secondary | ICD-10-CM | POA: Diagnosis not present

## 2015-08-30 DIAGNOSIS — N183 Chronic kidney disease, stage 3 (moderate): Secondary | ICD-10-CM | POA: Diagnosis not present

## 2015-08-30 DIAGNOSIS — D649 Anemia, unspecified: Secondary | ICD-10-CM | POA: Diagnosis not present

## 2015-08-31 DIAGNOSIS — I129 Hypertensive chronic kidney disease with stage 1 through stage 4 chronic kidney disease, or unspecified chronic kidney disease: Secondary | ICD-10-CM | POA: Diagnosis not present

## 2015-08-31 DIAGNOSIS — Z87442 Personal history of urinary calculi: Secondary | ICD-10-CM | POA: Diagnosis not present

## 2015-08-31 DIAGNOSIS — E1140 Type 2 diabetes mellitus with diabetic neuropathy, unspecified: Secondary | ICD-10-CM | POA: Diagnosis not present

## 2015-08-31 DIAGNOSIS — Z9181 History of falling: Secondary | ICD-10-CM | POA: Diagnosis not present

## 2015-08-31 DIAGNOSIS — N390 Urinary tract infection, site not specified: Secondary | ICD-10-CM | POA: Diagnosis not present

## 2015-08-31 DIAGNOSIS — E1122 Type 2 diabetes mellitus with diabetic chronic kidney disease: Secondary | ICD-10-CM | POA: Diagnosis not present

## 2015-08-31 DIAGNOSIS — Z7984 Long term (current) use of oral hypoglycemic drugs: Secondary | ICD-10-CM | POA: Diagnosis not present

## 2015-08-31 DIAGNOSIS — N183 Chronic kidney disease, stage 3 (moderate): Secondary | ICD-10-CM | POA: Diagnosis not present

## 2015-08-31 DIAGNOSIS — Z9981 Dependence on supplemental oxygen: Secondary | ICD-10-CM | POA: Diagnosis not present

## 2015-08-31 DIAGNOSIS — D649 Anemia, unspecified: Secondary | ICD-10-CM | POA: Diagnosis not present

## 2015-08-31 DIAGNOSIS — K219 Gastro-esophageal reflux disease without esophagitis: Secondary | ICD-10-CM | POA: Diagnosis not present

## 2015-09-01 DIAGNOSIS — Z7984 Long term (current) use of oral hypoglycemic drugs: Secondary | ICD-10-CM | POA: Diagnosis not present

## 2015-09-01 DIAGNOSIS — K219 Gastro-esophageal reflux disease without esophagitis: Secondary | ICD-10-CM | POA: Diagnosis not present

## 2015-09-01 DIAGNOSIS — Z9181 History of falling: Secondary | ICD-10-CM | POA: Diagnosis not present

## 2015-09-01 DIAGNOSIS — N183 Chronic kidney disease, stage 3 (moderate): Secondary | ICD-10-CM | POA: Diagnosis not present

## 2015-09-01 DIAGNOSIS — Z87442 Personal history of urinary calculi: Secondary | ICD-10-CM | POA: Diagnosis not present

## 2015-09-01 DIAGNOSIS — I129 Hypertensive chronic kidney disease with stage 1 through stage 4 chronic kidney disease, or unspecified chronic kidney disease: Secondary | ICD-10-CM | POA: Diagnosis not present

## 2015-09-01 DIAGNOSIS — E1140 Type 2 diabetes mellitus with diabetic neuropathy, unspecified: Secondary | ICD-10-CM | POA: Diagnosis not present

## 2015-09-01 DIAGNOSIS — Z9981 Dependence on supplemental oxygen: Secondary | ICD-10-CM | POA: Diagnosis not present

## 2015-09-01 DIAGNOSIS — E1122 Type 2 diabetes mellitus with diabetic chronic kidney disease: Secondary | ICD-10-CM | POA: Diagnosis not present

## 2015-09-01 DIAGNOSIS — D649 Anemia, unspecified: Secondary | ICD-10-CM | POA: Diagnosis not present

## 2015-09-01 DIAGNOSIS — N390 Urinary tract infection, site not specified: Secondary | ICD-10-CM | POA: Diagnosis not present

## 2015-09-02 DIAGNOSIS — I129 Hypertensive chronic kidney disease with stage 1 through stage 4 chronic kidney disease, or unspecified chronic kidney disease: Secondary | ICD-10-CM | POA: Diagnosis not present

## 2015-09-02 DIAGNOSIS — Z87442 Personal history of urinary calculi: Secondary | ICD-10-CM | POA: Diagnosis not present

## 2015-09-02 DIAGNOSIS — N390 Urinary tract infection, site not specified: Secondary | ICD-10-CM | POA: Diagnosis not present

## 2015-09-02 DIAGNOSIS — Z9981 Dependence on supplemental oxygen: Secondary | ICD-10-CM | POA: Diagnosis not present

## 2015-09-02 DIAGNOSIS — E1122 Type 2 diabetes mellitus with diabetic chronic kidney disease: Secondary | ICD-10-CM | POA: Diagnosis not present

## 2015-09-02 DIAGNOSIS — D649 Anemia, unspecified: Secondary | ICD-10-CM | POA: Diagnosis not present

## 2015-09-02 DIAGNOSIS — Z7984 Long term (current) use of oral hypoglycemic drugs: Secondary | ICD-10-CM | POA: Diagnosis not present

## 2015-09-02 DIAGNOSIS — Z9181 History of falling: Secondary | ICD-10-CM | POA: Diagnosis not present

## 2015-09-02 DIAGNOSIS — E1140 Type 2 diabetes mellitus with diabetic neuropathy, unspecified: Secondary | ICD-10-CM | POA: Diagnosis not present

## 2015-09-02 DIAGNOSIS — N183 Chronic kidney disease, stage 3 (moderate): Secondary | ICD-10-CM | POA: Diagnosis not present

## 2015-09-02 DIAGNOSIS — K219 Gastro-esophageal reflux disease without esophagitis: Secondary | ICD-10-CM | POA: Diagnosis not present

## 2015-09-05 ENCOUNTER — Telehealth: Payer: Self-pay | Admitting: Family Medicine

## 2015-09-05 DIAGNOSIS — N183 Chronic kidney disease, stage 3 (moderate): Secondary | ICD-10-CM | POA: Diagnosis not present

## 2015-09-05 DIAGNOSIS — Z9181 History of falling: Secondary | ICD-10-CM | POA: Diagnosis not present

## 2015-09-05 DIAGNOSIS — E1140 Type 2 diabetes mellitus with diabetic neuropathy, unspecified: Secondary | ICD-10-CM | POA: Diagnosis not present

## 2015-09-05 DIAGNOSIS — Z87442 Personal history of urinary calculi: Secondary | ICD-10-CM | POA: Diagnosis not present

## 2015-09-05 DIAGNOSIS — N390 Urinary tract infection, site not specified: Secondary | ICD-10-CM | POA: Diagnosis not present

## 2015-09-05 DIAGNOSIS — E1122 Type 2 diabetes mellitus with diabetic chronic kidney disease: Secondary | ICD-10-CM | POA: Diagnosis not present

## 2015-09-05 DIAGNOSIS — D649 Anemia, unspecified: Secondary | ICD-10-CM | POA: Diagnosis not present

## 2015-09-05 DIAGNOSIS — Z7984 Long term (current) use of oral hypoglycemic drugs: Secondary | ICD-10-CM | POA: Diagnosis not present

## 2015-09-05 DIAGNOSIS — K219 Gastro-esophageal reflux disease without esophagitis: Secondary | ICD-10-CM | POA: Diagnosis not present

## 2015-09-05 DIAGNOSIS — Z9981 Dependence on supplemental oxygen: Secondary | ICD-10-CM | POA: Diagnosis not present

## 2015-09-05 DIAGNOSIS — I129 Hypertensive chronic kidney disease with stage 1 through stage 4 chronic kidney disease, or unspecified chronic kidney disease: Secondary | ICD-10-CM | POA: Diagnosis not present

## 2015-09-05 IMAGING — MG MM DIGITAL SCREENING
4 series · 4 of 4 positions shown · non-contrast
Comparison: Previous exam(s)

ACR Breast Density Category a: The breast tissue is almost entirely
fatty.

CLINICAL DATA: Screening.

EXAM:
DIGITAL SCREENING BILATERAL MAMMOGRAM WITH CAD

[L CC]
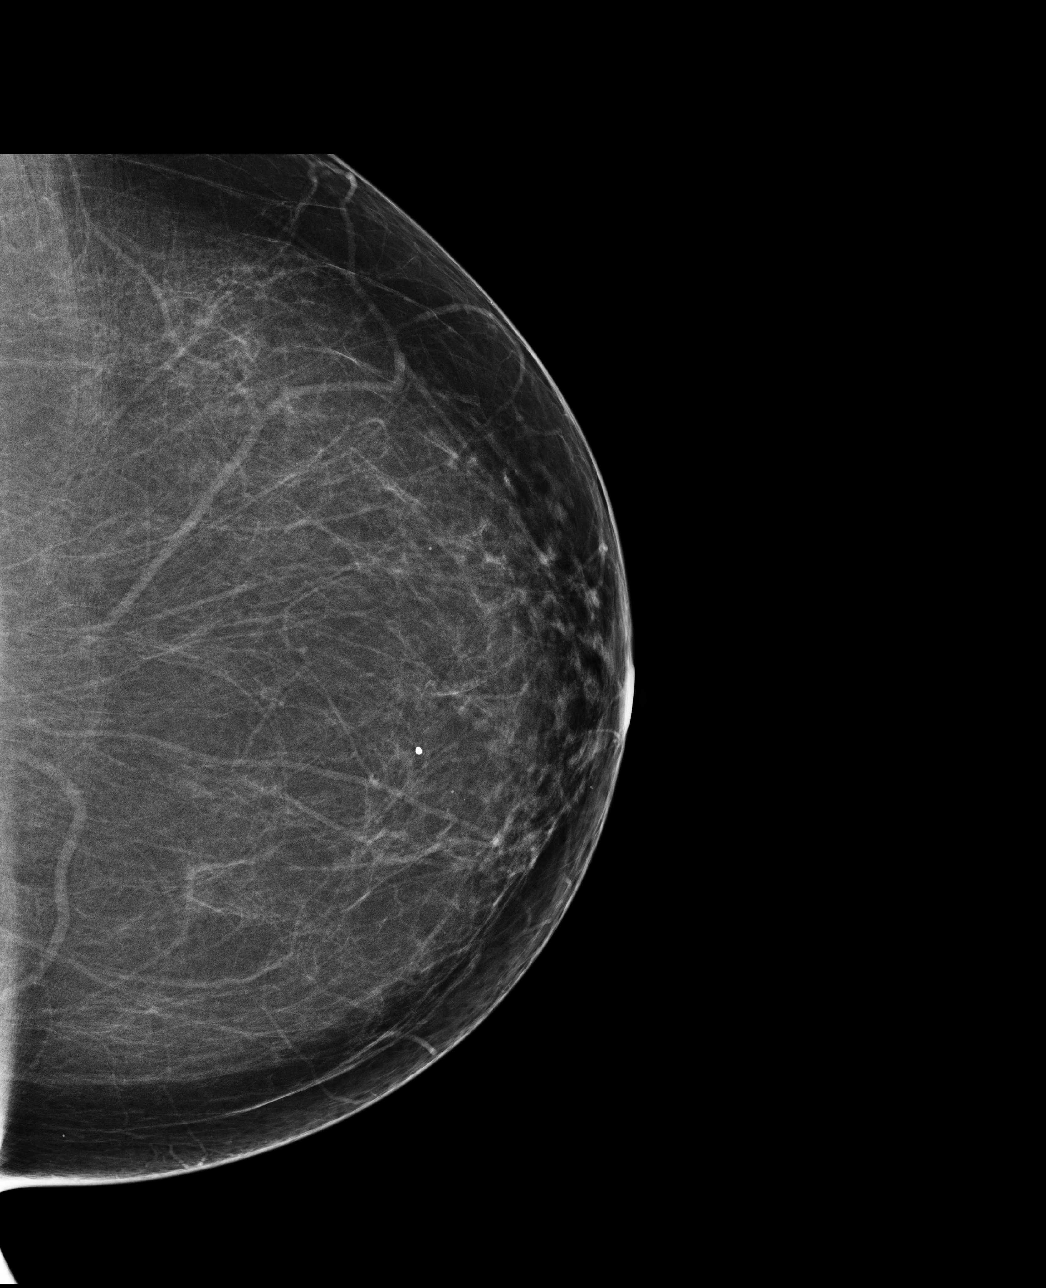

[L MLO]
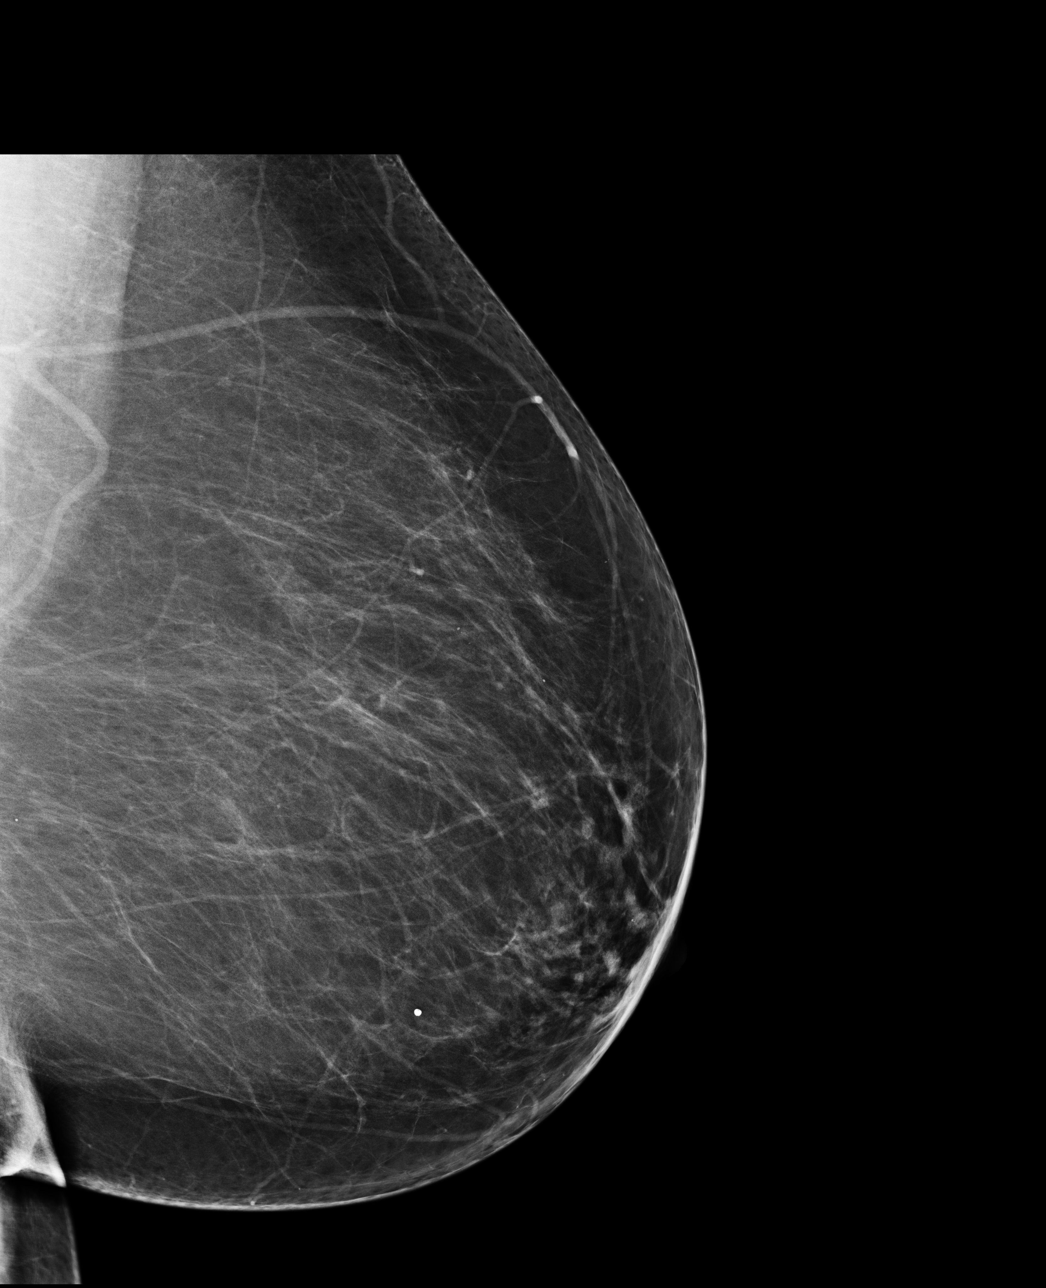

[R CC]
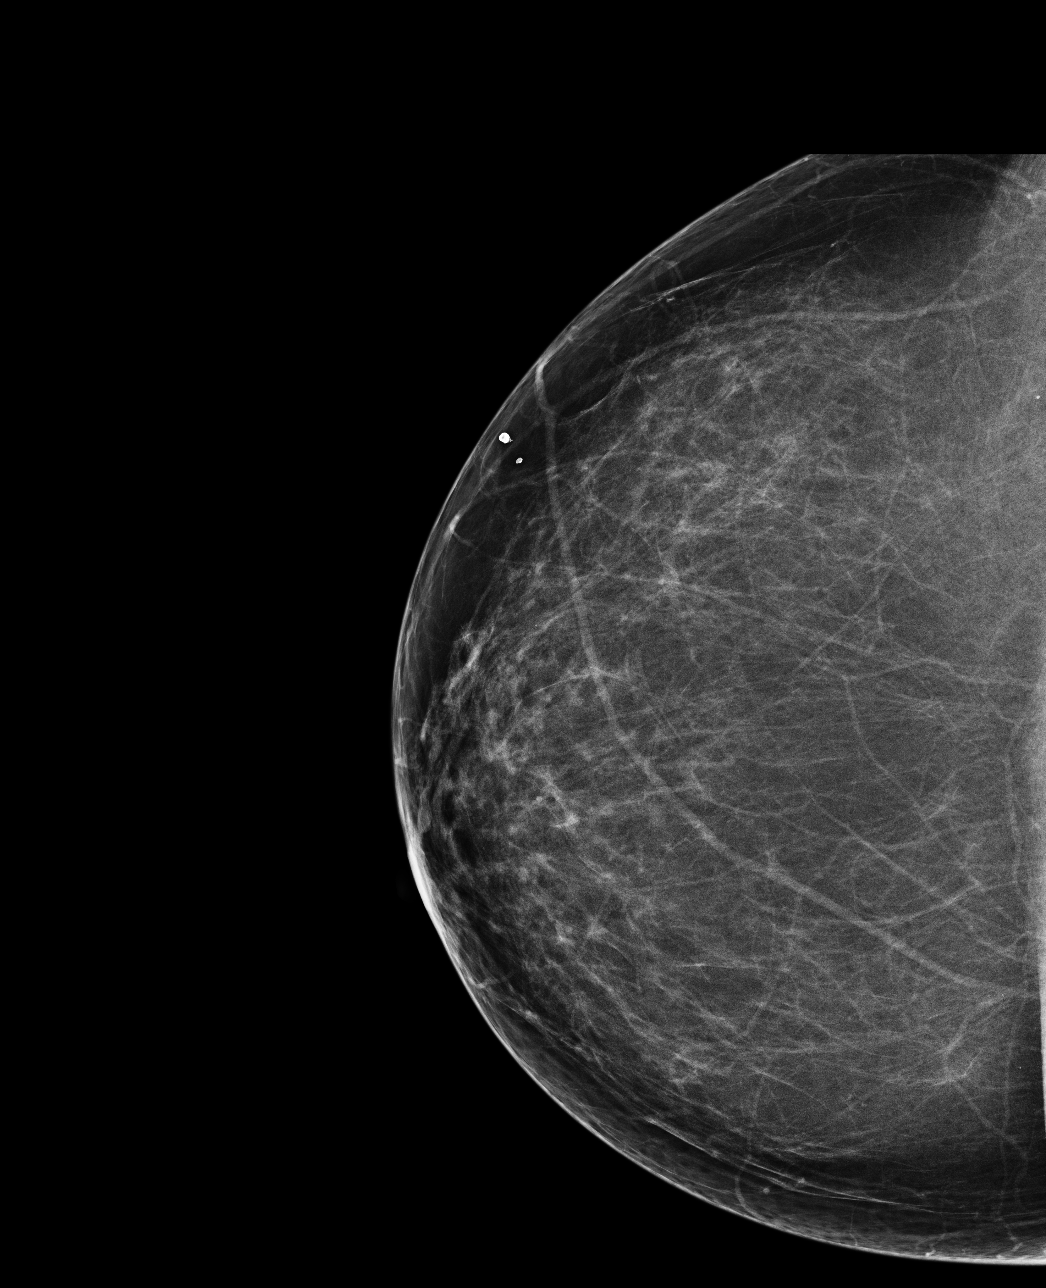

[R MLO]
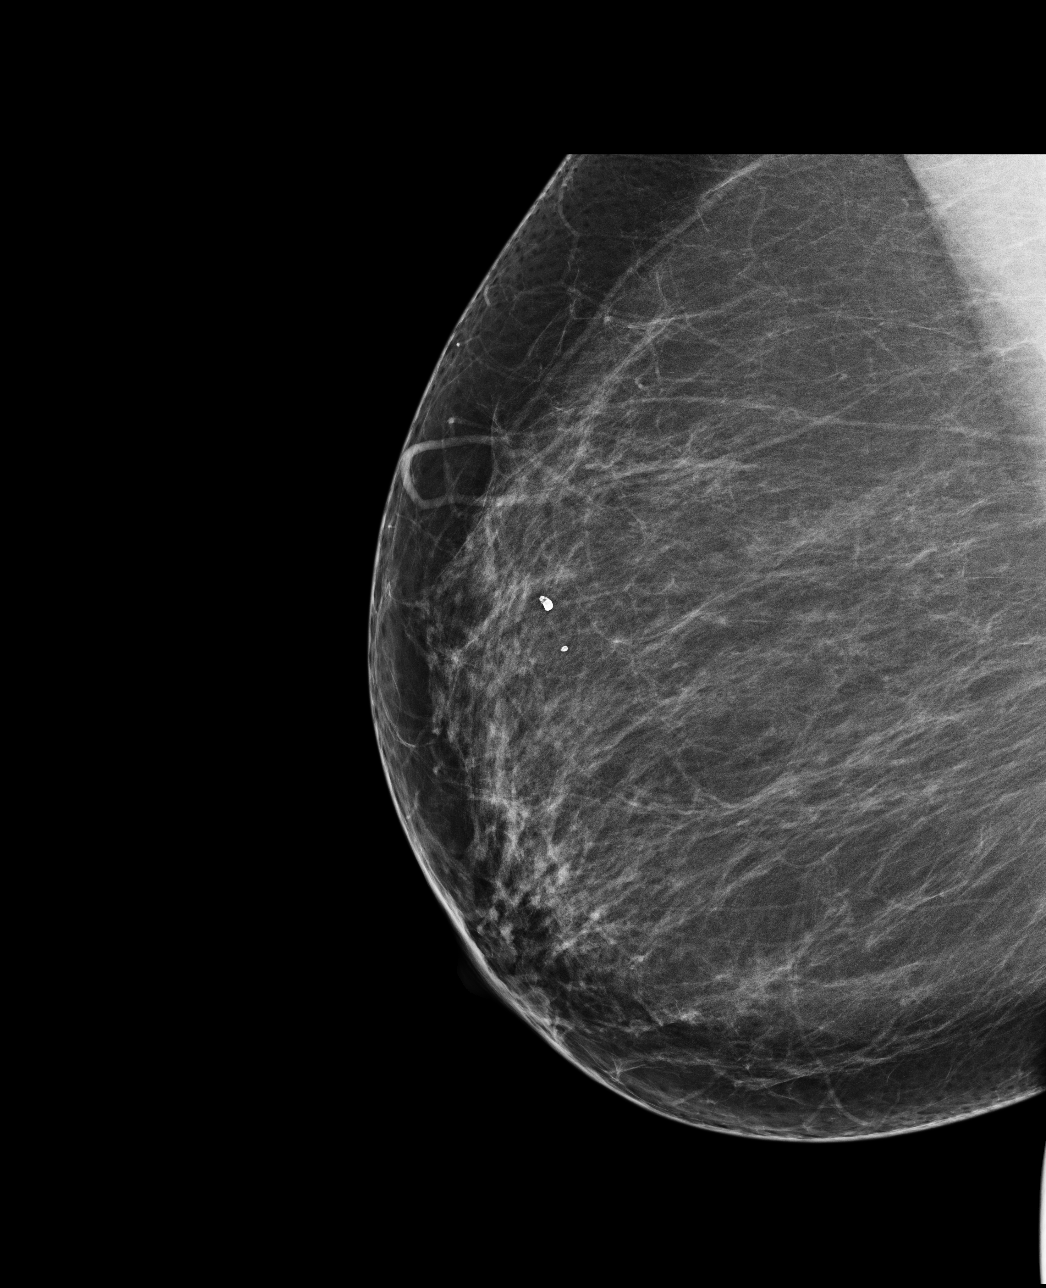

[4 of 4 positions shown; findings below may reference images not displayed]

FINDINGS: There are no findings suspicious for malignancy. Images were
processed with CAD.
IMPRESSION: No mammographic evidence of malignancy. A result letter of this
screening mammogram will be mailed directly to the patient.

RECOMMENDATION:
Screening mammogram in one year. (Code:[id number])

BI-RADS CATEGORY  1: Negative

## 2015-09-05 NOTE — Telephone Encounter (Signed)
Unable to see urology until May 5. Coming in for nurse visit tomorrow

## 2015-09-05 NOTE — Telephone Encounter (Signed)
Patient is asking for a refill on Cipro, she states that she has a UTI, please advise

## 2015-09-06 ENCOUNTER — Ambulatory Visit (INDEPENDENT_AMBULATORY_CARE_PROVIDER_SITE_OTHER): Payer: Medicare Other

## 2015-09-06 DIAGNOSIS — Z0289 Encounter for other administrative examinations: Secondary | ICD-10-CM

## 2015-09-06 DIAGNOSIS — N3001 Acute cystitis with hematuria: Secondary | ICD-10-CM | POA: Diagnosis not present

## 2015-09-06 LAB — POCT URINALYSIS DIPSTICK
Bilirubin, UA: NEGATIVE
Glucose, UA: NEGATIVE
Ketones, UA: NEGATIVE
Nitrite, UA: NEGATIVE
Protein, UA: 100
Spec Grav, UA: 1.015
Urobilinogen, UA: 0.2
pH, UA: 6

## 2015-09-06 MED ORDER — CIPROFLOXACIN HCL 500 MG PO TABS: 500.0000 mg | ORAL_TABLET | Freq: Two times a day (BID) | ORAL | Status: DC

## 2015-09-06 NOTE — Progress Notes (Signed)
UA sent for culture. And 3 days of cipro given to patient and will call with the culture results

## 2015-09-08 ENCOUNTER — Other Ambulatory Visit: Payer: Self-pay | Admitting: Family Medicine

## 2015-09-08 ENCOUNTER — Encounter (HOSPITAL_COMMUNITY): Payer: Self-pay | Admitting: Urology

## 2015-09-08 DIAGNOSIS — Z7984 Long term (current) use of oral hypoglycemic drugs: Secondary | ICD-10-CM | POA: Diagnosis not present

## 2015-09-08 DIAGNOSIS — K219 Gastro-esophageal reflux disease without esophagitis: Secondary | ICD-10-CM | POA: Diagnosis not present

## 2015-09-08 DIAGNOSIS — Z9181 History of falling: Secondary | ICD-10-CM | POA: Diagnosis not present

## 2015-09-08 DIAGNOSIS — I129 Hypertensive chronic kidney disease with stage 1 through stage 4 chronic kidney disease, or unspecified chronic kidney disease: Secondary | ICD-10-CM | POA: Diagnosis not present

## 2015-09-08 DIAGNOSIS — Z9981 Dependence on supplemental oxygen: Secondary | ICD-10-CM | POA: Diagnosis not present

## 2015-09-08 DIAGNOSIS — Z87442 Personal history of urinary calculi: Secondary | ICD-10-CM | POA: Diagnosis not present

## 2015-09-08 DIAGNOSIS — N183 Chronic kidney disease, stage 3 (moderate): Secondary | ICD-10-CM | POA: Diagnosis not present

## 2015-09-08 DIAGNOSIS — E1122 Type 2 diabetes mellitus with diabetic chronic kidney disease: Secondary | ICD-10-CM | POA: Diagnosis not present

## 2015-09-08 DIAGNOSIS — D649 Anemia, unspecified: Secondary | ICD-10-CM | POA: Diagnosis not present

## 2015-09-08 DIAGNOSIS — E1140 Type 2 diabetes mellitus with diabetic neuropathy, unspecified: Secondary | ICD-10-CM | POA: Diagnosis not present

## 2015-09-08 DIAGNOSIS — N390 Urinary tract infection, site not specified: Secondary | ICD-10-CM | POA: Diagnosis not present

## 2015-09-08 LAB — URINE CULTURE

## 2015-09-09 ENCOUNTER — Other Ambulatory Visit: Payer: Self-pay

## 2015-09-09 MED ORDER — FLUCONAZOLE 150 MG PO TABS: 150.0000 mg | ORAL_TABLET | Freq: Once | ORAL | Status: DC

## 2015-09-09 NOTE — Progress Notes (Signed)
See result message.

## 2015-09-10 IMAGING — MR MR THORACIC SPINE W/O CM
4 of 7 series · 11 of 48 positions shown · non-contrast
Comparison: Thoracic spine radiographs [REDACTED] and prior chest
CT 11/23/2010.

CLINICAL DATA: Pain and weakness and numbness.

EXAM:
MRI THORACIC SPINE WITHOUT CONTRAST
TECHNIQUE: Multiplanar, multisequence MR imaging was performed. No intravenous
contrast was administered.

[Series 6: T2 · sagittal · 4.0mm · 0.42mm/px · 3 of 13 slices shown (1 of 3)]
[im 1/13]
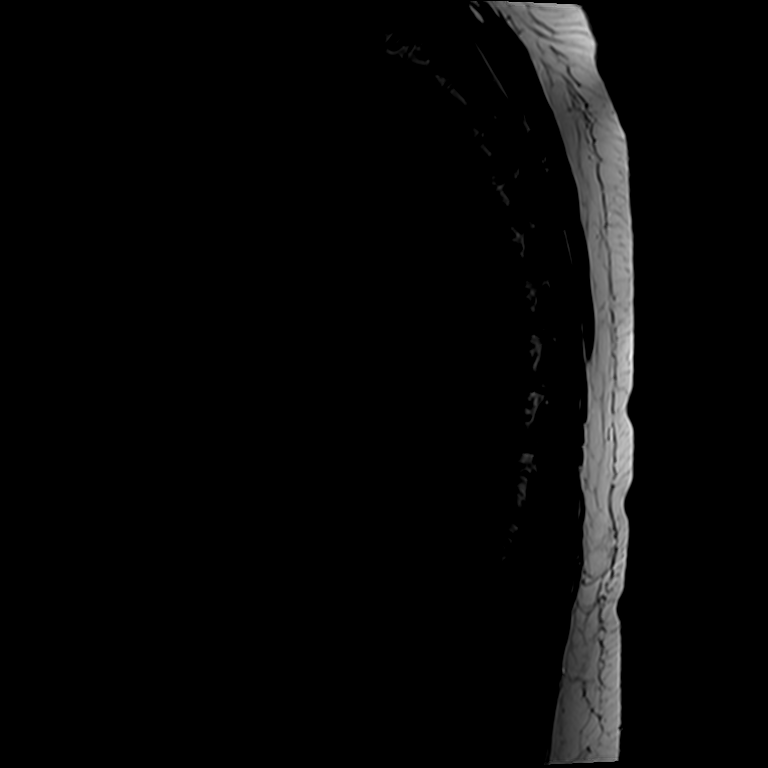
[im 9/13]
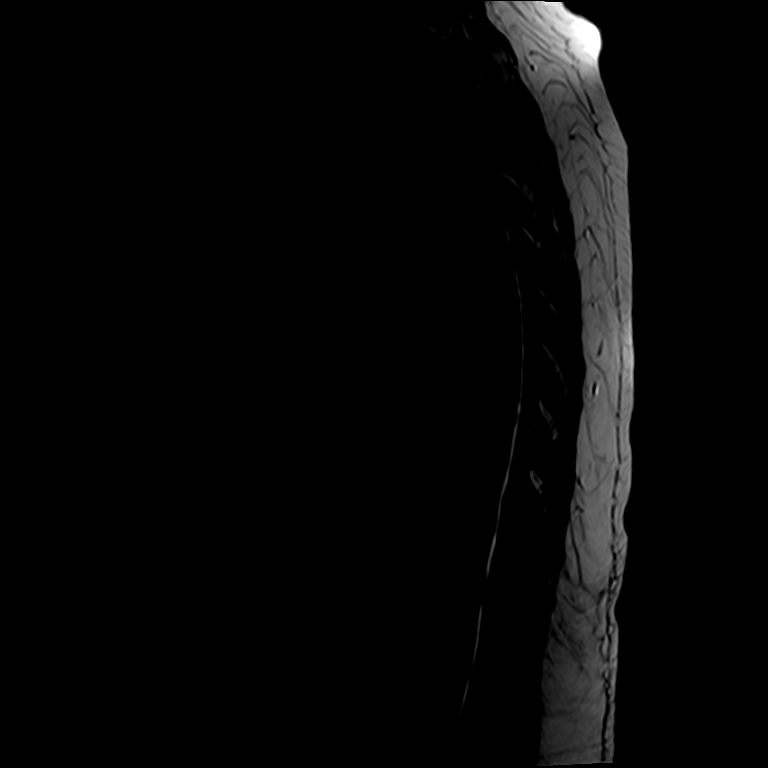
[im 13/13]
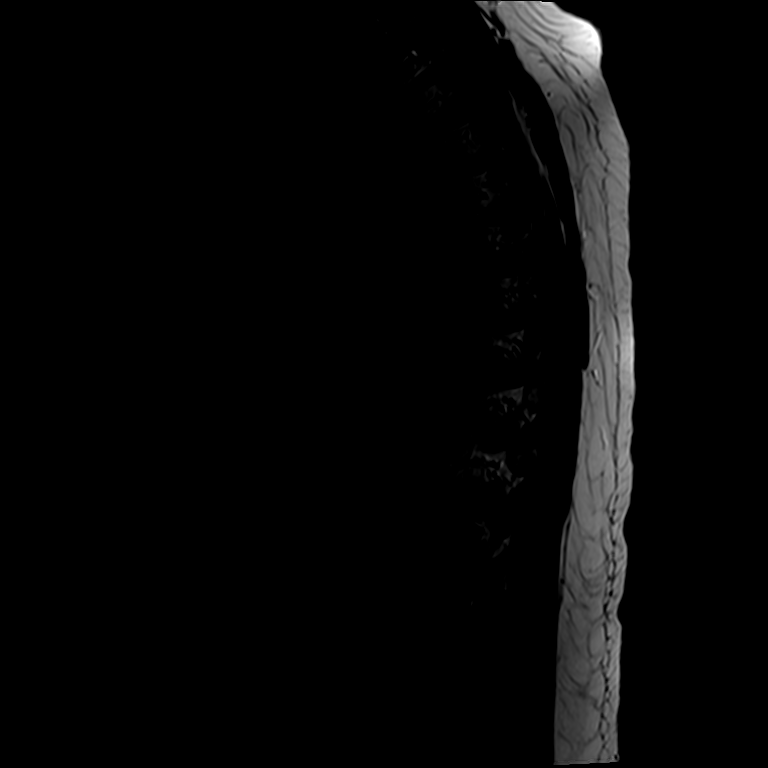

[Series 7: T1 · sagittal · 4.0mm · 0.42mm/px · 2 of 13 slices shown]
[im 1/13]
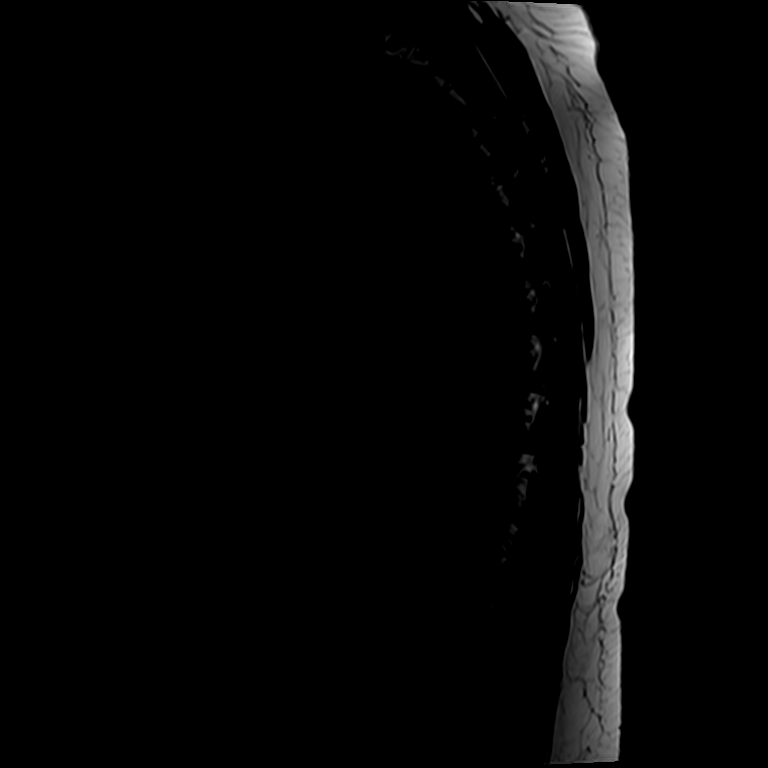
[im 9/13]
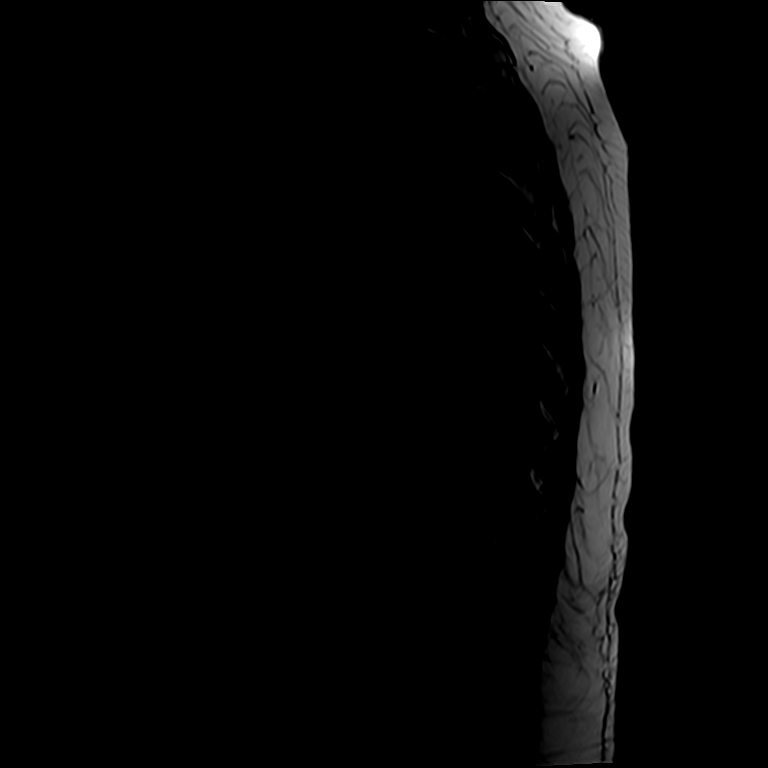

[Series 10: T2 · axial · 3.0mm · 0.26mm/px · z∈[-211,-65]mm · 3 of 36 slices shown (2 of 3)]
[im 6/36]
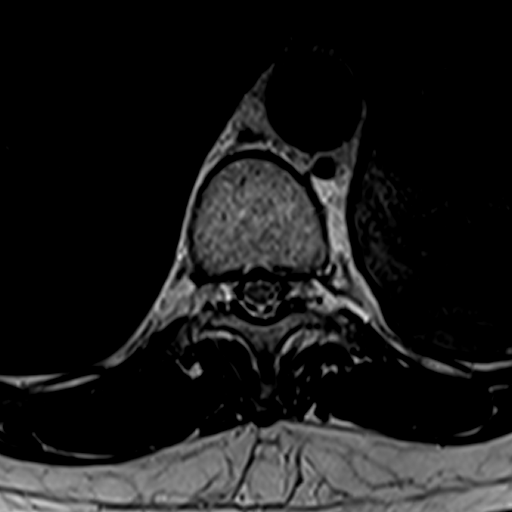
[im 18/36]
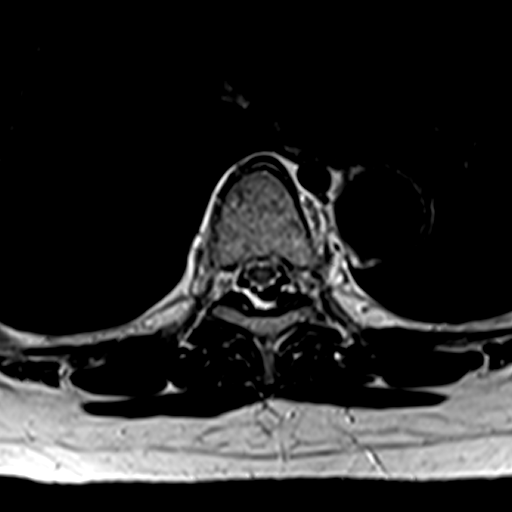
[im 30/36]
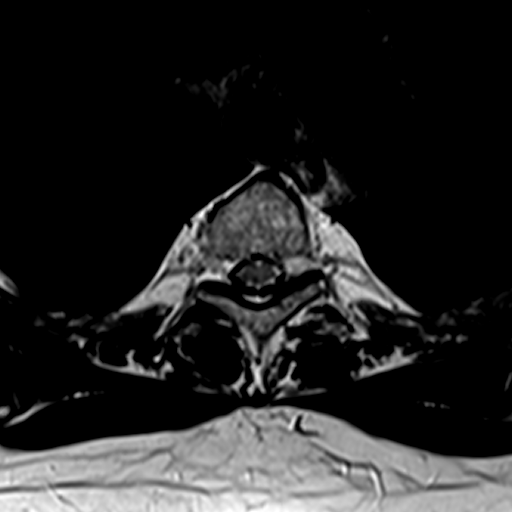

[Series 12: T2 · sagittal · 4.0mm · 0.41mm/px · 3 of 13 slices shown (3 of 3)]
[im 1/13]
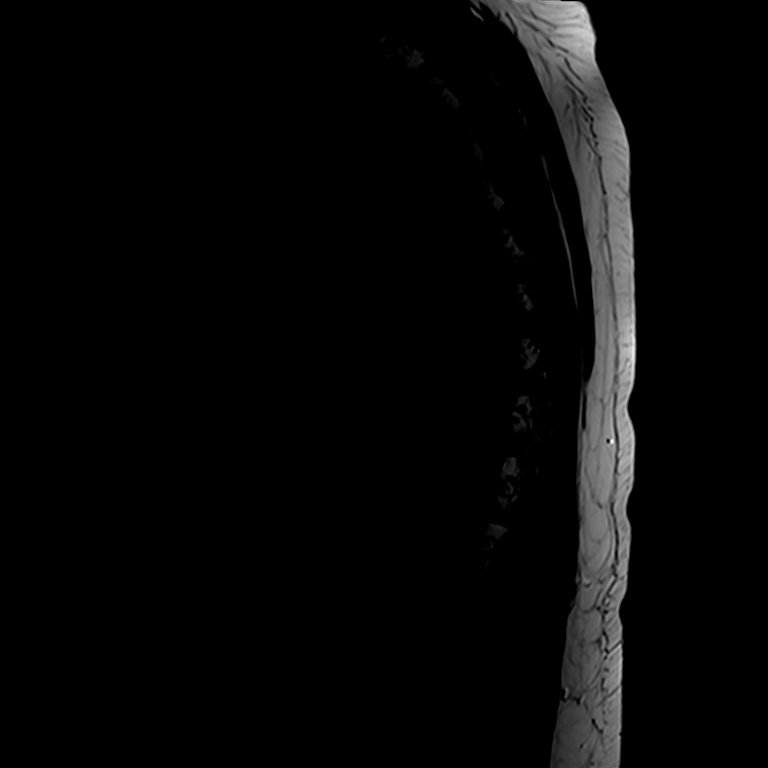
[im 7/13]
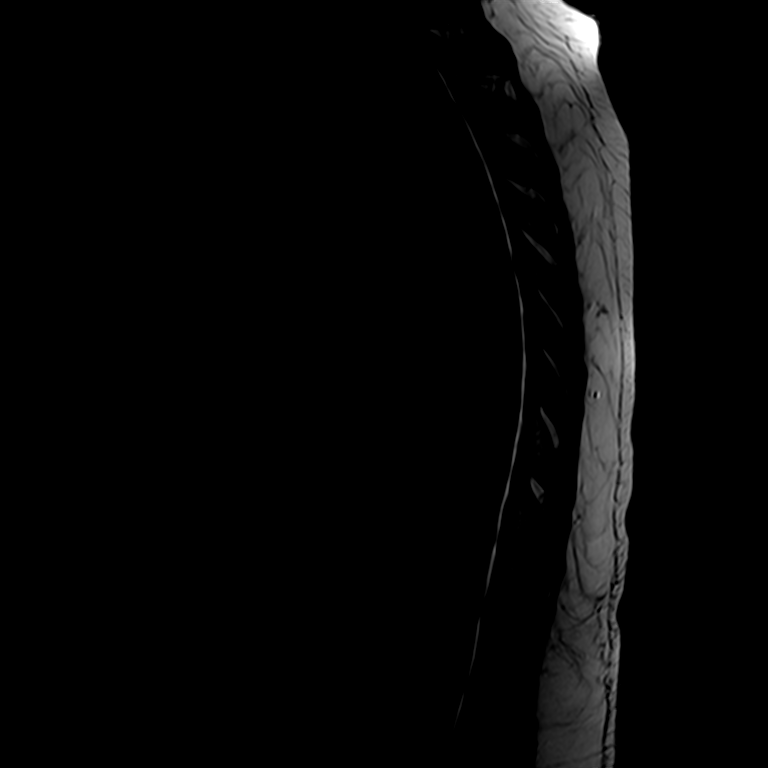
[im 13/13]
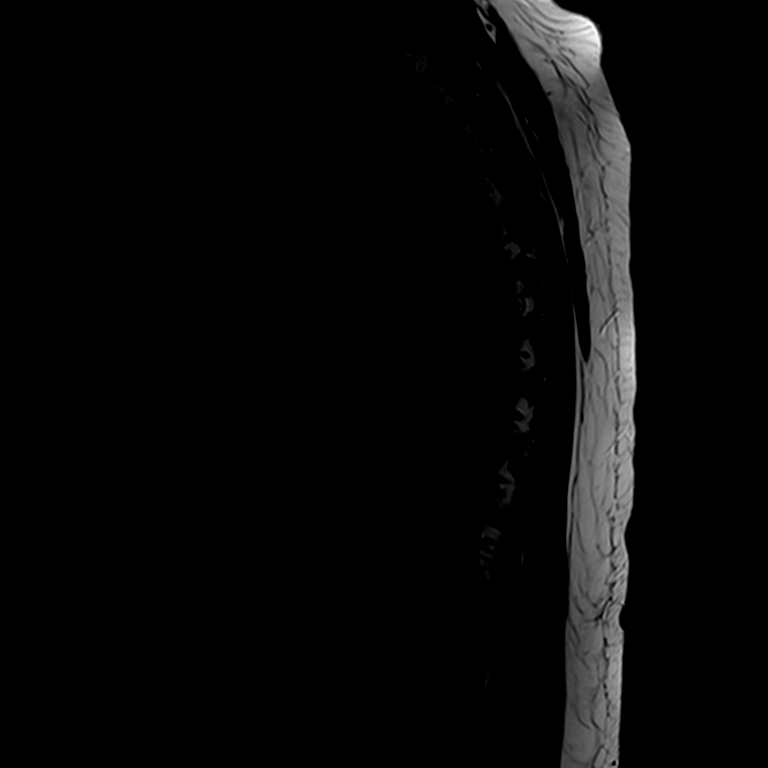

[11 of 48 positions shown; findings below may reference images not displayed]

FINDINGS: Normal alignment of the thoracic vertebral bodies. They demonstrate
normal marrow signal. The thoracic spinal cord demonstrates normal
signal intensity. No cord lesion or syrinx. No significant thoracic
disc protrusions, spinal or foraminal stenosis. Minimal bulging
annulus at T7-8. No foraminal lesions.
IMPRESSION: Unremarkable lumbar spine MRI examination.

## 2015-09-11 ENCOUNTER — Other Ambulatory Visit: Payer: Self-pay | Admitting: Family Medicine

## 2015-09-13 ENCOUNTER — Encounter: Payer: Self-pay | Admitting: Family Medicine

## 2015-09-13 ENCOUNTER — Ambulatory Visit (INDEPENDENT_AMBULATORY_CARE_PROVIDER_SITE_OTHER): Payer: Medicare Other | Admitting: Family Medicine

## 2015-09-13 VITALS — BP 110/80 | HR 83 | Resp 16 | Ht 66.0 in | Wt 177.0 lb

## 2015-09-13 DIAGNOSIS — I10 Essential (primary) hypertension: Secondary | ICD-10-CM

## 2015-09-13 DIAGNOSIS — D126 Benign neoplasm of colon, unspecified: Secondary | ICD-10-CM

## 2015-09-13 DIAGNOSIS — Z09 Encounter for follow-up examination after completed treatment for conditions other than malignant neoplasm: Secondary | ICD-10-CM | POA: Diagnosis not present

## 2015-09-13 DIAGNOSIS — F32A Depression, unspecified: Secondary | ICD-10-CM

## 2015-09-13 DIAGNOSIS — E785 Hyperlipidemia, unspecified: Secondary | ICD-10-CM | POA: Diagnosis not present

## 2015-09-13 DIAGNOSIS — E1121 Type 2 diabetes mellitus with diabetic nephropathy: Secondary | ICD-10-CM | POA: Diagnosis not present

## 2015-09-13 DIAGNOSIS — E559 Vitamin D deficiency, unspecified: Secondary | ICD-10-CM | POA: Diagnosis not present

## 2015-09-13 DIAGNOSIS — F329 Major depressive disorder, single episode, unspecified: Secondary | ICD-10-CM

## 2015-09-13 LAB — CBC
HCT: 32 % — ABNORMAL LOW (ref 35.0–45.0)
Hemoglobin: 9.9 g/dL — ABNORMAL LOW (ref 11.7–15.5)
MCH: 23.9 pg — ABNORMAL LOW (ref 27.0–33.0)
MCHC: 30.9 g/dL — ABNORMAL LOW (ref 32.0–36.0)
MCV: 77.3 fL — ABNORMAL LOW (ref 80.0–100.0)
MPV: 9.3 fL (ref 7.5–12.5)
Platelets: 413 10*3/uL — ABNORMAL HIGH (ref 140–400)
RBC: 4.14 MIL/uL (ref 3.80–5.10)
RDW: 26.4 % — ABNORMAL HIGH (ref 11.0–15.0)
WBC: 9 10*3/uL (ref 3.8–10.8)

## 2015-09-13 LAB — HEMOGLOBIN A1C
Hgb A1c MFr Bld: 6.4 % — ABNORMAL HIGH (ref <5.7)
Mean Plasma Glucose: 137 mg/dL

## 2015-09-13 LAB — TSH: TSH: 2.32 mIU/L

## 2015-09-13 NOTE — Progress Notes (Signed)
Subjective:    Patient ID: Cheryl Abbott, female    DOB: [birth year], 70 y.o.   MRN: [id number]  HPI Patient in for follow up of recent hospitalization. Discharge summary, and laboratory and radiology data are reviewed, and any questions or concerns about recent hospitalization are discussed. Specific issues requiring follow up are specifically addressed. Still c/o weakness and nausea, no vomiting Two meds have been discontinued at most recent hospitalization Has upcoming repot urology procedure, and needs a colonoscopy also   Review of Systems See HPI Denies recent fever or chills. Denies sinus pressure, nasal congestion, ear pain or sore throat. Denies chest congestion, productive cough or wheezing. Denies chest pains, palpitations and leg swelling Denies abdominal pain, nausea, vomiting,diarrhea or constipation.   Denies dysuria, frequency, hesitancy or incontinence. . Denies headaches, seizures, numbness, or tingling. Denies uncontrolled  depression, anxiety or insomnia. Denies skin break down or rash.        Objective:   Physical Exam BP 110/80 mmHg  Pulse 83  Resp 16  Ht 5\' 6"  (1.676 m)  Wt 177 lb (80.287 kg)  BMI 28.58 kg/m2  SpO2 94% Patient alert and oriented and in no cardiopulmonary distress.  HEENT: No facial asymmetry, EOMI,   oropharynx pink and moist.  Neck supple no JVD, no mass.  Chest: Clear to auscultation bilaterally.  CVS: S1, S2 no murmurs, no S3.Regular rate.  ABD: Soft non tender.   Ext: No edema  MS: Adequate ROM spine, shoulders, hips and knees.  Skin: Intact, no ulcerations or rash noted.  Psych: Good eye contact, normal affect. Memory intact mildly anxious or depressed appearing.  CNS: CN 2-12 intact, power,  normal throughout.no focal deficits noted.        Assessment & Plan:  Hospital discharge follow-up Gradually regaining strength from illness due to recurrent kidney stones with infection requiring several procedures  this year, out pt procedure is planned to extract current stone, pt very wary of the procedure, will need to discuss further with the urologist  Hypertension goal BP (blood pressure) < 130/80 Controlled off of medication  Due to significant weight loss D/c amlodipine, medication taken from pt who still had it but reports that she was not taking it per hospital d/c instruction  Colon adenomas Repeat colonoscopy past due , and pt anemic refer for same  Type 2 diabetes with nephropathy ([place]) Discontinue  Metformin Cheryl Abbott is reminded of the importance of commitment to daily physical activity for 30 minutes or more, as able and the need to limit carbohydrate intake to 30 to 60 grams per meal to help with blood sugar control.   The need to take medication as prescribed, test blood sugar as directed, and to call between visits if there is a concern that blood sugar is uncontrolled is also discussed.   Cheryl Abbott is reminded of the importance of daily foot exam, annual eye examination, and good blood sugar, blood pressure and cholesterol control.  Diabetic Labs Latest Ref Rng 09/13/2015 08/22/2015 08/21/2015 08/20/2015 08/19/2015  HbA1c <5.7 % 6.4(H) - - - -  Microalbumin <2.0 mg/dL - - - - -  Micro/Creat Ratio 0.0 - 30.0 mg/g - - - - -  Chol 125 - 200 mg/dL - - - - -  HDL >=46 mg/dL - - - - -  Calc LDL <130 mg/dL - - - - -  Triglycerides <150 mg/dL - - - - -  Creatinine 0.60 - 0.93 mg/dL 1.41(H) 1.40(H) 1.41(H) 1.69(H) 1.69(H)  BP/Weight 09/13/2015 08/22/2015 08/19/2015 08/18/2015 08/11/2015 08/11/2015 [id number]  Systolic BP A999333 A999333 - - [id number] 123456 A999333  Diastolic BP 80 62 - - 123456 60 76  Wt. (Lbs) 177 - 182.6 - 185 185.12 191  BMI 28.58 - - 29.49 29.87 29.89 30.37   Foot/eye exam completion dates Latest Ref Rng 12/07/2014 07/26/2014  Eye Exam No Retinopathy - No Retinopathy  Foot Form Completion - Done -         Depression Challenged by recurrent severe illness but doing well overall, no med  change

## 2015-09-13 NOTE — Patient Instructions (Signed)
Annual wellness August 11 or after, call if you needme sooner  Labs today  STOP amlodipine and metformin, we will throw them out  Thankful that you have improved  Thank you  for choosing [facility]. We consider it a privelige to serve you.  Delivering excellent health care in a caring and  compassionate way is our goal.  Partnering with you,  so that together we can achieve this goal is our strategy.

## 2015-09-14 LAB — COMPLETE METABOLIC PANEL WITH GFR
ALT: 11 U/L (ref 6–29)
AST: 13 U/L (ref 10–35)
Albumin: 3.9 g/dL (ref 3.6–5.1)
Alkaline Phosphatase: 71 U/L (ref 33–130)
BUN: 24 mg/dL (ref 7–25)
CO2: 24 mmol/L (ref 20–31)
Calcium: 9.9 mg/dL (ref 8.6–10.4)
Chloride: 98 mmol/L (ref 98–110)
Creat: 1.41 mg/dL — ABNORMAL HIGH (ref 0.60–0.93)
GFR, Est African American: 43 mL/min — ABNORMAL LOW (ref >=60)
GFR, Est Non African American: 38 mL/min — ABNORMAL LOW (ref >=60)
Glucose, Bld: 88 mg/dL (ref 65–99)
Potassium: 5 mmol/L (ref 3.5–5.3)
Sodium: 134 mmol/L — ABNORMAL LOW (ref 135–146)
Total Bilirubin: 0.2 mg/dL (ref 0.2–1.2)
Total Protein: 8.5 g/dL — ABNORMAL HIGH (ref 6.1–8.1)

## 2015-09-14 LAB — VITAMIN D 25 HYDROXY (VIT D DEFICIENCY, FRACTURES): Vit D, 25-Hydroxy: 38 ng/mL (ref 30–100)

## 2015-09-15 ENCOUNTER — Other Ambulatory Visit: Payer: Self-pay

## 2015-09-15 ENCOUNTER — Telehealth: Payer: Self-pay | Admitting: Family Medicine

## 2015-09-15 NOTE — Telephone Encounter (Signed)
Patient is calling stating she has questions about her Metformin and also asking for lab results, please advise?

## 2015-09-15 NOTE — Telephone Encounter (Signed)
Patient aware of results and the need to D/C metformin.  Note sent to pharmacy to D/C.

## 2015-09-19 DIAGNOSIS — Z09 Encounter for follow-up examination after completed treatment for conditions other than malignant neoplasm: Secondary | ICD-10-CM | POA: Insufficient documentation

## 2015-09-19 NOTE — Assessment & Plan Note (Signed)
Challenged by recurrent severe illness but doing well overall, no med change

## 2015-09-19 NOTE — Assessment & Plan Note (Signed)
Discontinue  Metformin Cheryl Abbott is reminded of the importance of commitment to daily physical activity for 30 minutes or more, as able and the need to limit carbohydrate intake to 30 to 60 grams per meal to help with blood sugar control.   The need to take medication as prescribed, test blood sugar as directed, and to call between visits if there is a concern that blood sugar is uncontrolled is also discussed.   Cheryl Abbott is reminded of the importance of daily foot exam, annual eye examination, and good blood sugar, blood pressure and cholesterol control.  Diabetic Labs Latest Ref Rng 09/13/2015 08/22/2015 08/21/2015 08/20/2015 08/19/2015  HbA1c <5.7 % 6.4(H) - - - -  Microalbumin <2.0 mg/dL - - - - -  Micro/Creat Ratio 0.0 - 30.0 mg/g - - - - -  Chol 125 - 200 mg/dL - - - - -  HDL >=46 mg/dL - - - - -  Calc LDL <130 mg/dL - - - - -  Triglycerides <150 mg/dL - - - - -  Creatinine 0.60 - 0.93 mg/dL 1.41(H) 1.40(H) 1.41(H) 1.69(H) 1.69(H)   BP/Weight 09/13/2015 08/22/2015 08/19/2015 08/18/2015 08/11/2015 08/11/2015 [id number]  Systolic BP A999333 A999333 - - [id number] 123456 A999333  Diastolic BP 80 62 - - 123456 60 76  Wt. (Lbs) 177 - 182.6 - 185 185.12 191  BMI 28.58 - - 29.49 29.87 29.89 30.37   Foot/eye exam completion dates Latest Ref Rng 12/07/2014 07/26/2014  Eye Exam No Retinopathy - No Retinopathy  Foot Form Completion - Done -

## 2015-09-19 NOTE — Assessment & Plan Note (Signed)
Gradually regaining strength from illness due to recurrent kidney stones with infection requiring several procedures this year, out pt procedure is planned to extract current stone, pt very wary of the procedure, will need to discuss further with the urologist

## 2015-09-19 NOTE — Assessment & Plan Note (Signed)
Repeat colonoscopy past due , and pt anemic refer for same

## 2015-09-19 NOTE — Assessment & Plan Note (Signed)
Controlled off of medication  Due to significant weight loss D/c amlodipine, medication taken from pt who still had it but reports that she was not taking it per hospital d/c instruction

## 2015-09-27 ENCOUNTER — Ambulatory Visit: Payer: Medicare Other | Admitting: Urology

## 2015-09-28 ENCOUNTER — Encounter: Payer: Self-pay | Admitting: Gastroenterology

## 2015-09-28 ENCOUNTER — Ambulatory Visit (INDEPENDENT_AMBULATORY_CARE_PROVIDER_SITE_OTHER): Payer: Medicare Other | Admitting: Gastroenterology

## 2015-09-28 VITALS — BP 123/80 | HR 90 | Temp 98.0°F | Ht 66.0 in | Wt 176.4 lb

## 2015-09-28 DIAGNOSIS — D509 Iron deficiency anemia, unspecified: Secondary | ICD-10-CM | POA: Diagnosis not present

## 2015-09-28 DIAGNOSIS — R1310 Dysphagia, unspecified: Secondary | ICD-10-CM

## 2015-09-28 NOTE — Progress Notes (Signed)
cc'ed to pcp °

## 2015-09-28 NOTE — Progress Notes (Signed)
Subjective:    Patient ID: Cheryl Abbott, female    DOB: 05-Jun-1946, 70 y.o.   MRN: [id number]  Cheryl Nakayama, MD  HPI [place] 2016. DEC 2016 183 LBS. HAS TROUBLE WITH HAs AND SORE TAIL BONE. OCCASIONAL FEELS LIKE SHE GETS STRANGLED ON LIQUIDS. NO PROBLEM WITH SOLIDS. APPETITE: DOWN SOME. MORE THIRSTY THAN HUNGRY. SINCE LAST VISIT HAD LIPOMA REMOVED AND 2 KIDNEY SURGERIES. BMs: DOING GOOD BUT IF SIPS A DAY FEELS HEAVY AND TAKES 2 STOOL SOFTENERS. RARE EPISODES OF NAUSEA/VOMTIING/DIARRHEA AT THE SAME TIME-LAST EPISODE WAS LAST WEEK(DUARTION:~5 MINS). MAY HAVE CHEST PAIN AND TAKE ACID REFLUX PILLS AND IT GOES AWAY. SLEPT AFTER SURGERIES. DIDN'T WANT TO EAT JUST WANTED TO DRINK.   PT DENIES FEVER, CHILLS, HEMATOCHEZIA, HEMATEMESIS, melena, CHEST PAIN, SHORTNESS OF BREATH,  CHANGE IN BOWEL IN HABITS,  abdominal pain, OR problems with sedation.   Past Medical History  Diagnosis Date  . Anxiety disorder   . Hyperlipidemia   . Hypertension   . Depression   . Chronic back pain   . Headache   . Type 2 diabetes mellitus ([place])   . GERD (gastroesophageal reflux disease)   . Anemia   . History of adenomatous polyp of colon     2008  . Fatty liver   . Sigmoid diverticulosis   . Left ureteral stone   . History of kidney stones   . Arthritis   . Hypercalcemia   . OSA treated with BiPAP     per study 2007  . Nephrolithiasis   . Sciatica   . Lumbar disc disease with radiculopathy   . Neuropathy, peripheral ([place])   . 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 Flexible sigmoidoscopy  09/11/2011    [id number] Internal hemorrhoids  . Esophagogastroduodenoscopy (egd) with propofol N/A 03/17/2013    Procedure: ESOPHAGOGASTRODUODENOSCOPY (EGD) WITH PROPOFOL;  Surgeon: Danie Binder, MD;  Location: [facility];  Service: Endoscopy;  Laterality: N/A;  . Savory dilation N/A 03/17/2013    Procedure: SAVORY DILATION;  Surgeon: Danie Binder, MD;  Location: [facility];  Service: Endoscopy;  Laterality: N/A;  #12.8, 14, 15, 16 dilators used  . Biopsy N/A 03/17/2013    Procedure: GASTRIC BIOPSIES;  Surgeon: Danie Binder, MD;  Location: [facility];  Service: Endoscopy;  Laterality: N/A;  . Polypectomy N/A 03/17/2013    Procedure: GASTRIC POLYPECTOMY;  Surgeon: Danie Binder, MD;  Location: [facility];  Service: Endoscopy;  Laterality: N/A;  . Colonoscopy  last one 10/02/2011    MOD [facility] TICS, IH, NEXT TCS APR 2018  . Cysto/   right ureterosocopy laser lithotripsy stone extraction  10-13-2004  . Cysto/  right retrograde pyelogram/  placment right ureteral stent  01-10-2010  . Percutaneous nephrostolithotomy Bilateral 12/  2011     [facility]  . Cardiovascular stress test  01-01-2014    normal lexiscan cardiolite/  no ischemia/ infarct/  normal LV function and wall motion ,  ef 81%  . Transthoracic echocardiogram  01-01-2014    mild LVH/  ef [id number]  grade I diastolic dysfunction/  trivial MR, TR, and PR  . Removal right thigh cyst  2006  . Vaginal hysterectomy  1970's  . Cystoscopy with retrograde pyelogram, ureteroscopy and stent placement Left 10/21/2014    Procedure: CYSTOSCOPY WITH RETROGRADE PYELOGRAM, URETEROSCOPY AND STENT PLACEMENT;  Surgeon: Franchot Gallo, MD;  Location: [facility]: [facility];  Service: Urology;  Laterality: Left;  . Holmium laser application Left [id number]    Procedure: HOLMIUM LASER APPLICATION;  Surgeon: Franchot Gallo, MD;  Location: [facility];  Service: Urology;  Laterality: Left;  Marland Kitchen Mass excision Left 03/04/2015    Procedure: EXCISION OF SOFT TISSUE NEOPLASM LEFT ARM;  Surgeon: Aviva Signs, MD;  Location: [facility];  Service: General;  Laterality: Left;  . Esophagogastroduodenoscopy (egd) with propofol N/A 06/10/2015    Procedure: ESOPHAGOGASTRODUODENOSCOPY (EGD) WITH PROPOFOL;  Surgeon: Danie Binder, MD;  [facility];  Service: Endoscopy;  Laterality: N/A;  1245 - pt  knows to arrive at 8:30  . Savory dilation N/A 06/10/2015    Procedure: SAVORY DILATION;  Surgeon: Danie Binder, MD;  [facility];  Service: Endoscopy;  Laterality: N/A;  . Biopsy  06/10/2015    Procedure: BIOPSY;  Surgeon: Danie Binder, MD;  [facility];  Service: Endoscopy;;  gastric biopsy  . Cardiac catheterization  05-11-2003  dr Shelva Majestic ([facility])    Abnormal cardiolite/   Normal coronary arteries and normal LVF,  ef  63%  . Cystoscopy/retrograde/ureteroscopy/stone extraction with basket Bilateral 08/02/2015    Procedure: CYSTOSCOPY; BILATERAL RETROGRADE PYELOGRAMS; BILATERAL URETEROSCOPY, STONE EXTRACTION WITH BASKET;  Surgeon: Franchot Gallo, MD;  Location[facility];  Service: Urology;  Laterality: Bilateral;  . Cystoscopy w/ ureteral stent placement Right 08/19/2015    Procedure: CYSTOSCOPY WITH RETROGRADE PYELOGRAM/URETERAL STENT PLACEMENT;  Surgeon: Franchot Gallo, MD;  Location[facility];  Service: Urology;  Laterality: Right;  . Cystoscopy/retrograde/ureteroscopy/stone extraction with basket Right 06/28/2015    Procedure: CYSTOSCOPY/RETROGRADE/URETEROSCOPY/STONE EXTRACTION WITH BASKET, RIGHT URETERAL DOUBLE J STENT PLACEMENT;  Surgeon: Franchot Gallo, MD;  Location: [facility];  Service: Urology;  Laterality: Right;  . Holmium laser application Right [id number]    Procedure: HOLMIUM LASER APPLICATION;  Surgeon: Franchot Gallo, MD;  Location: [facility];  Service: Urology;  Laterality: Right;    Allergies  Allergen Reactions  . Ace Inhibitors Cough  . Keflex [Cephalexin] Diarrhea and Nausea And Vomiting  . Nitrofurantoin Diarrhea and Nausea And Vomiting  . Penicillins Hives, Rash and Other (See Comments)        Current Outpatient Prescriptions  Medication Sig Dispense Refill  . ALPRAZolam (XANAX) 0.5 MG tablet TAKE 1 TABLET TWICE A DAY 180 tablet 0  . docusate sodium (COLACE) 100 MG capsule Take 200 mg by mouth at bedtime.     .  Ferrous Sulfate (IRON) 325 (65 Fe) MG TABS Take 1 tablet by mouth daily. 30 each 0  . lovastatin (MEVACOR) 40 MG tablet TAKE 2 TABLETS (80 MG TOTAL) BY MOUTH AT BEDTIME. 180 tablet 1  . pantoprazole (PROTONIX) 40 MG tablet Take 1 tablet (40 mg total) by mouth 2 (two) times daily. 60 tablet 5  . PARoxetine (PAXIL) 40 MG tablet TAKE 1 TABLET (40 MG TOTAL) BY MOUTH EVERY MORNING. 90 tablet 0  . Vitamin D, Ergocalciferol, (DRISDOL) 50000 UNITS CAPS capsule TAKE 1 CAPSULE (50,000 UNITS TOTAL) BY MOUTH EVERY 7 (SEVEN) DAYS. (Patient taking differently: Take 50,000 Units by mouth every Sunday. TAKE 1 CAPSULE (50,000 UNITS TOTAL) BY MOUTH EVERY 7 (SEVEN) DAYS.) 4 capsule 5  . fluconazole (DIFLUCAN) 150 MG tablet Take 1 tablet (150 mg total) by mouth once. (Patient not taking: Reported on 09/28/2015) 2 tablet 0  . gabapentin (NEURONTIN) 100 MG capsule TAKE 1 CAPSULE (100 MG TOTAL) BY MOUTH AT BEDTIME. (Patient not taking: Reported on 09/28/2015) 30 capsule 4  .  ondansetron (ZOFRAN) 4 MG tablet Take 1 tablet (4 mg total) by mouth every 8 (eight) hours as needed for nausea or vomiting. (Patient not taking: Reported on 09/28/2015) 20 tablet 0     Review of Systems     Objective:   Physical Exam  Constitutional: She is oriented to person, place, and time. She appears well-developed and well-nourished. No distress.  HENT:  Head: Normocephalic and atraumatic.  Mouth/Throat: Oropharynx is clear and moist. No oropharyngeal exudate.  Eyes: Pupils are equal, round, and reactive to light. No scleral icterus.  Neck: Normal range of motion. Neck supple.  Cardiovascular: Normal rate, regular rhythm and normal heart sounds.   Pulmonary/Chest: Effort normal and breath sounds normal. No respiratory distress.  Abdominal: Soft. Bowel sounds are normal. She exhibits no distension. There is no tenderness.  Musculoskeletal: She exhibits no edema.  WALKS ASSISTED WITH A CANE.  Lymphadenopathy:    She has no cervical  adenopathy.  Neurological: She is alert and oriented to person, place, and time.  NO FOCAL DEFICITS  Psychiatric: She has a normal mood and affect.  Vitals reviewed.     Assessment & Plan:

## 2015-09-28 NOTE — Patient Instructions (Signed)
DRINK WATER TO KEEP YOUR URINE LIGHT YELLOW.  EAT IRON RICH FOODS. SEE HANDOUT.  Take IRON three times a  Day for 3 mos.  CONTINUE PROTONIX. TAKE 30 MINUTES PRIOR TO MEALS ONCE OR TWICE DAILY. YOU CAN TRY GOING DOWN TO ONCE DAILY IF YOU WANT TO SEE IF YOUR HEARTBURN IS CONTROLLED.  FOLLOW UP IN 4 MOS. WE WILL RECHECK YOU BLOOD COUNT AND IRON STORES.

## 2015-09-28 NOTE — Progress Notes (Signed)
ON RECALL  °

## 2015-09-28 NOTE — Assessment & Plan Note (Signed)
SYMPTOMS FAIRLY WELL CONTROLLED.  CONTINUE TO MONITOR SYMPTOMS. 

## 2015-09-28 NOTE — Assessment & Plan Note (Signed)
NO BRBPR OR MELENA. MULTIPLE KIDNEYS STONES/SURGERYIES IN 2017.  DRINK WATER TO KEEP YOUR URINE LIGHT YELLOW.  EAT IRON RICH FOODS. SEE HANDOUT. Take IRON three times a  Day for 3 mos. CONTINUE PROTONIX. TAKE 30 MINUTES PRIOR TO MEALS ONCE OR TWICE DAILY. TITRATE TO ONCE DAILY IF TO SEE IF HEARTBURN CAN BE CONTROLLED. FOLLOW UP IN 4 MOS. WE WILL RECHECK YOU BLOOD COUNT AND IRON STORES. IF LOW CONSIDER CAPSULE ENDOSCOPY +/-TCS

## 2015-09-30 ENCOUNTER — Telehealth: Payer: Self-pay | Admitting: Family Medicine

## 2015-09-30 NOTE — Telephone Encounter (Signed)
Enid Derry left a voicemail asking for Loma Sousa to return her call

## 2015-09-30 NOTE — Telephone Encounter (Signed)
Spoke with patient and she does not have contact # to fax FMLA in for daughter as of yet.   Will mail completed form to patient and can still fax when number is received.

## 2015-10-02 ENCOUNTER — Other Ambulatory Visit: Payer: Self-pay | Admitting: Family Medicine

## 2015-10-03 ENCOUNTER — Telehealth: Payer: Self-pay

## 2015-10-03 NOTE — Telephone Encounter (Signed)
needs rx sent to [facility] for shower chair

## 2015-10-10 NOTE — Telephone Encounter (Signed)
Written , pls fax and let her know

## 2015-10-10 NOTE — Telephone Encounter (Signed)
Faxed to [facility] 

## 2015-10-18 ENCOUNTER — Ambulatory Visit (INDEPENDENT_AMBULATORY_CARE_PROVIDER_SITE_OTHER): Payer: Medicare Other | Admitting: Urology

## 2015-10-18 DIAGNOSIS — N201 Calculus of ureter: Secondary | ICD-10-CM

## 2015-10-18 DIAGNOSIS — N135 Crossing vessel and stricture of ureter without hydronephrosis: Secondary | ICD-10-CM | POA: Diagnosis not present

## 2015-10-18 DIAGNOSIS — N390 Urinary tract infection, site not specified: Secondary | ICD-10-CM | POA: Diagnosis not present

## 2015-10-20 IMAGING — CT CT HEAD W/O CM
1 series · 16 of 30 positions shown, 20 images · non-contrast
Comparison: CT 07/11/2006.

CLINICAL DATA: Dizziness and headache.

EXAM:
CT HEAD WITHOUT CONTRAST
TECHNIQUE: Contiguous axial images were obtained from the base of the skull
through the vertex without intravenous contrast.

[Series 2: headseq 4.8 h37s · axial · 0.47mm/px · z∈[+86,+246]mm · 16 of 36 slices shown, 20 images]
[im 2/36  brain]
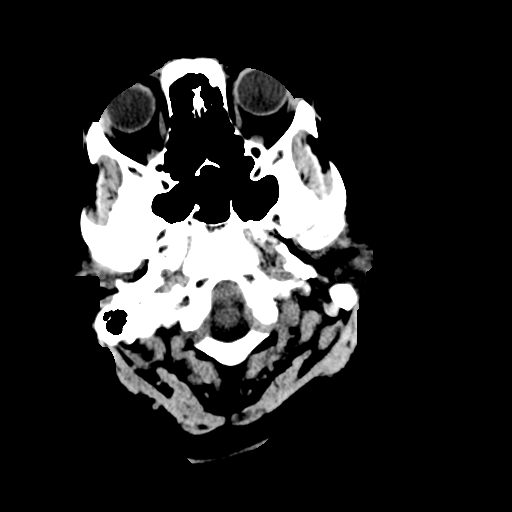
[im 2/36  bone]
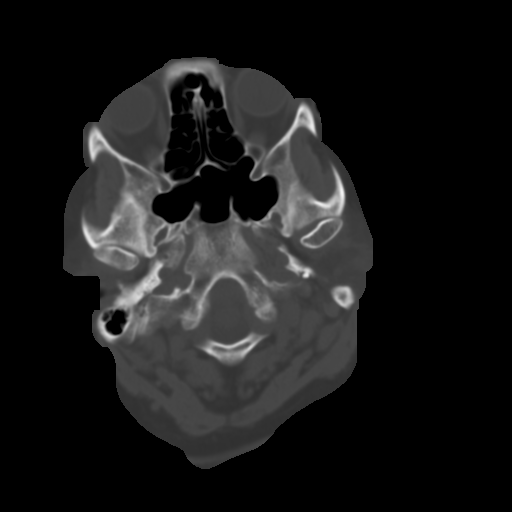
[im 4/36  brain]
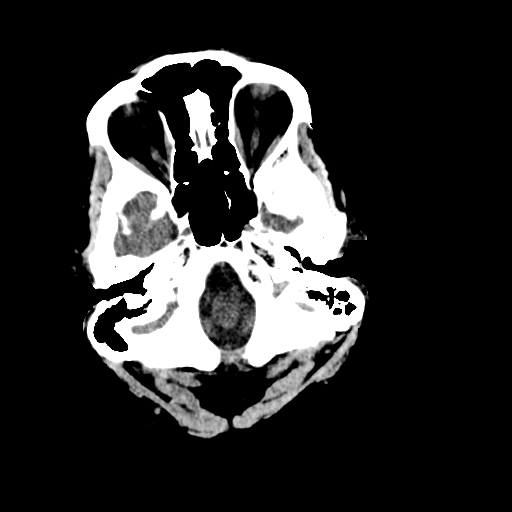
[im 7/36  brain]
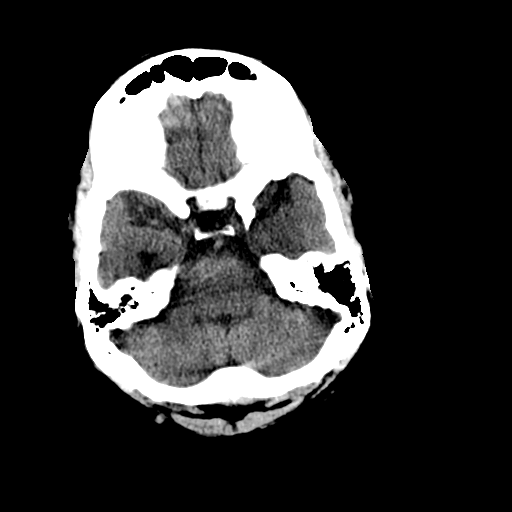
[im 9/36  brain]
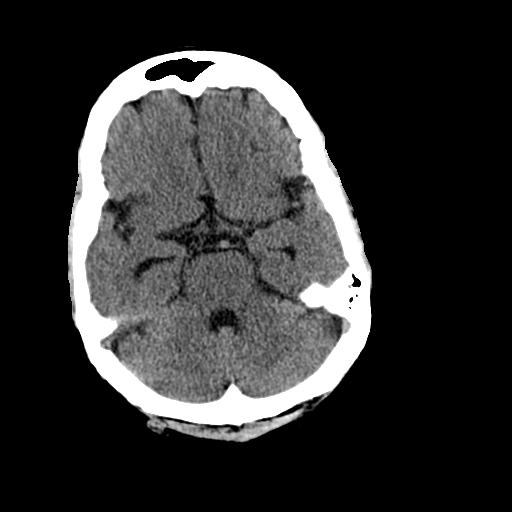
[im 10/36  brain]
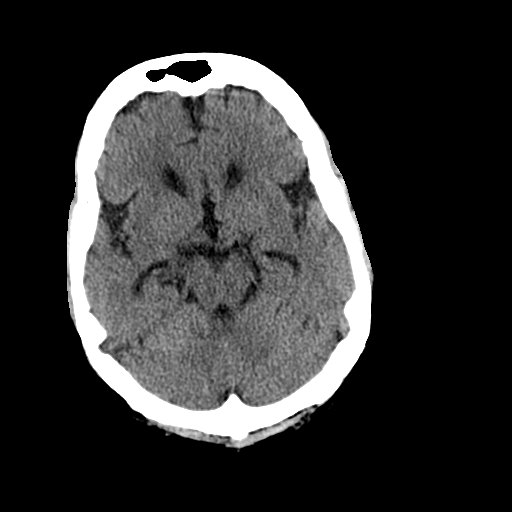
[im 10/36  bone]
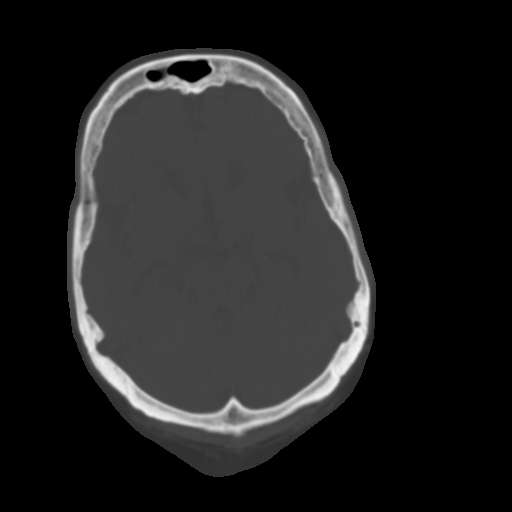
[im 13/36  brain]
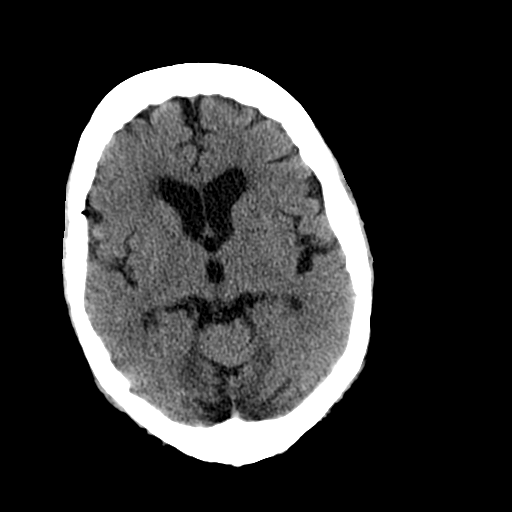
[im 15/36  brain]
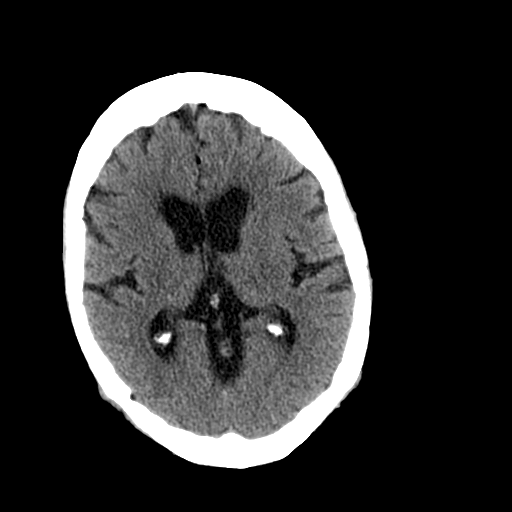
[im 17/36  brain]
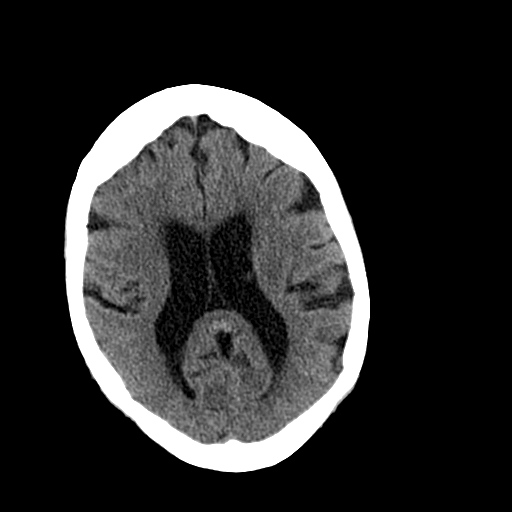
[im 19/36  brain]
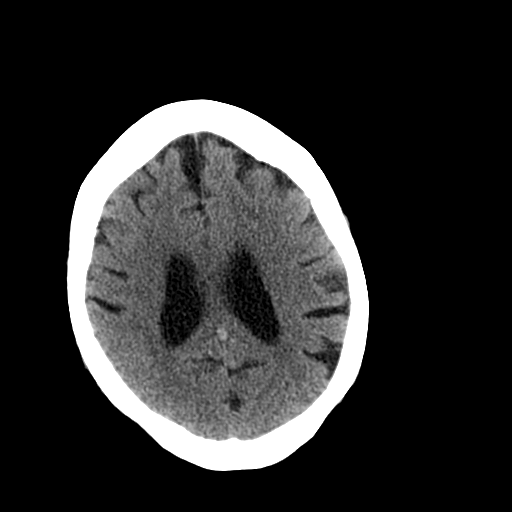
[im 19/36  bone]
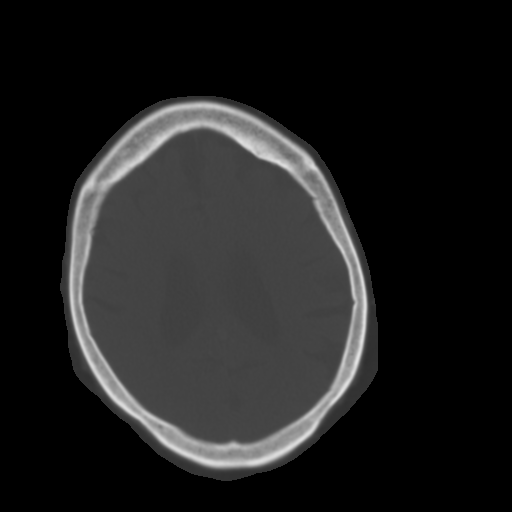
[im 21/36  brain]
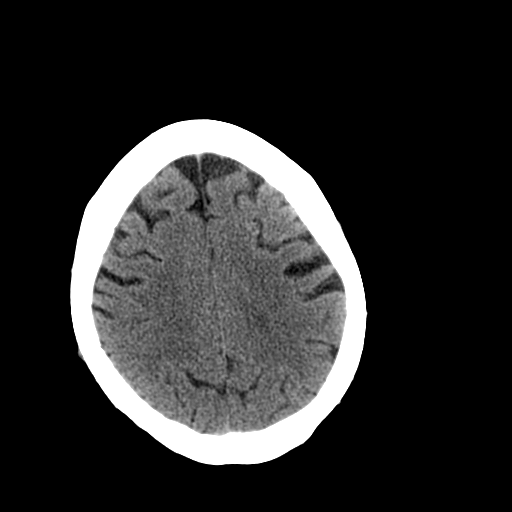
[im 23/36  brain]
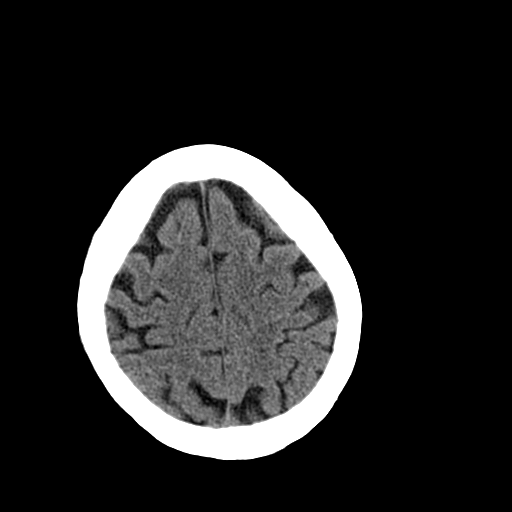
[im 26/36  brain]
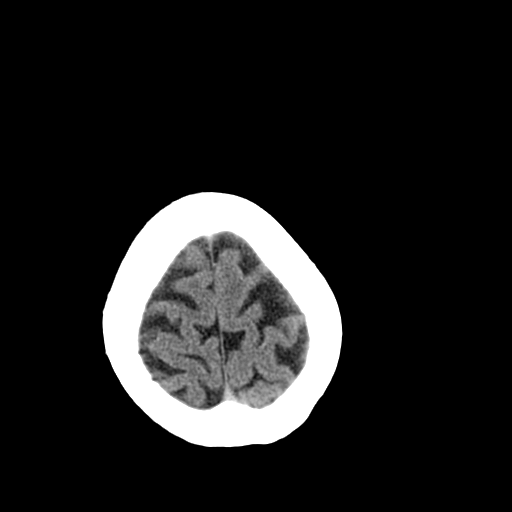
[im 27/36  brain]
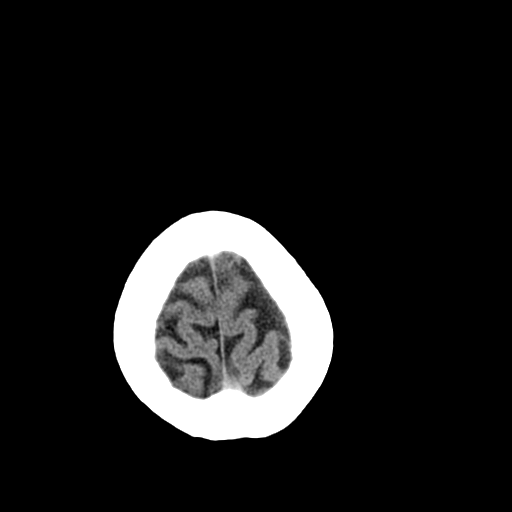
[im 27/36  bone]
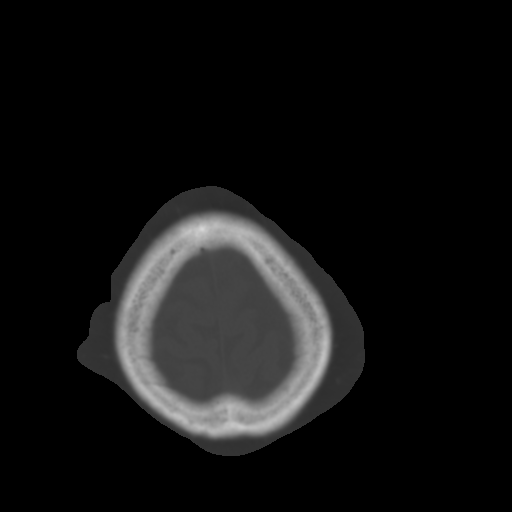
[im 29/36  brain]
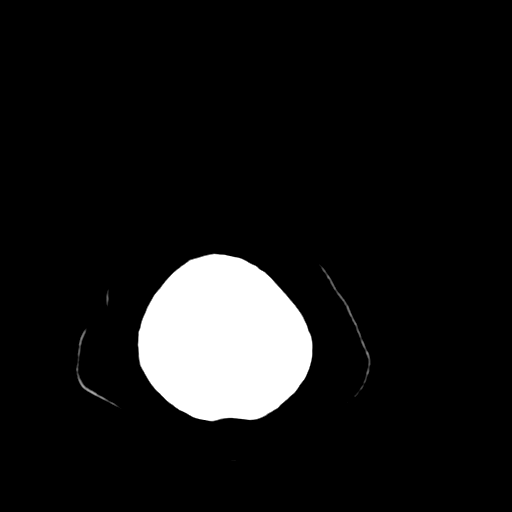
[im 32/36  brain]
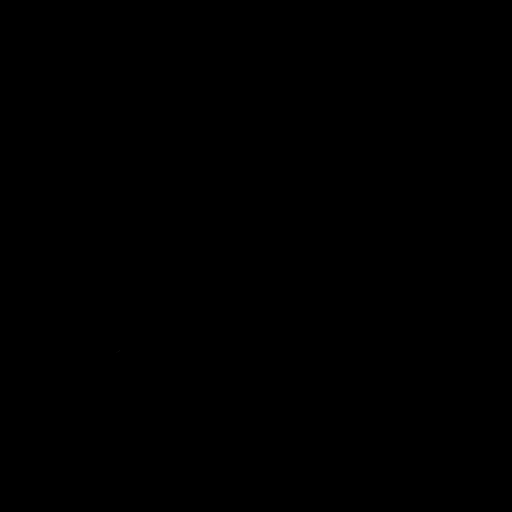
[im 34/36  brain]
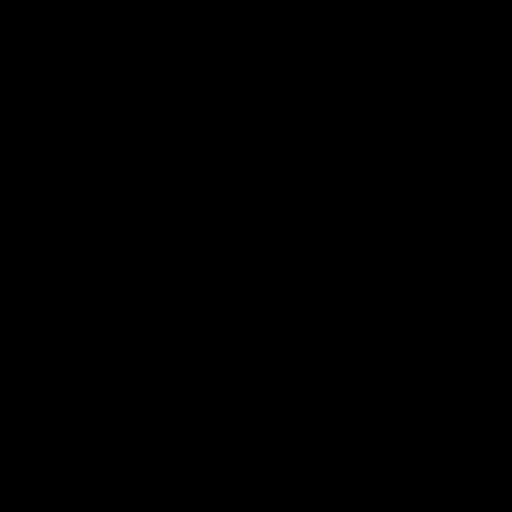

[16 of 30 positions shown; findings below may reference images not displayed]

FINDINGS: No mass. No hydrocephalus. No hemorrhage. Orbits are normal. Mild
subcortical and deep white matter lucencies noted suggesting chronic
white matter ischemia. No acute bony abnormality. Visualized
paranasal sinuses are clear. Mastoids are clear.
IMPRESSION: No acute intracranial abnormality. Findings consistent with mild
chronic white matter ischemia.

## 2015-10-26 ENCOUNTER — Telehealth: Payer: Self-pay | Admitting: Urology

## 2015-10-26 NOTE — Telephone Encounter (Signed)
Pt advised of pre op date/time and sx date. Pre op: 11/17/15 @ 11:00am-[place] Sx: 11/22/15 with Dr Diona Fanti @ [facility]. Post op: 12/27/15 @ 1:30pm [place] location.

## 2015-11-03 ENCOUNTER — Telehealth: Payer: Self-pay | Admitting: Family Medicine

## 2015-11-03 ENCOUNTER — Ambulatory Visit: Payer: Medicare Other | Admitting: Family Medicine

## 2015-11-03 ENCOUNTER — Other Ambulatory Visit: Payer: Self-pay

## 2015-11-03 MED ORDER — LOVASTATIN 40 MG PO TABS: ORAL_TABLET | ORAL | Status: DC

## 2015-11-03 NOTE — Telephone Encounter (Signed)
Cheryl Abbott is calling asking for a refill on lovastatin (MEVACOR) 40 MG tablet and also she is asking if Dr. Moshe Cipro would give her permission to Drive now, please advise?

## 2015-11-03 NOTE — Telephone Encounter (Signed)
Called patient and left message notifying that rx has been refilled.  She needs to contact provider that advised her to stop driving (not noted from this office)

## 2015-11-14 ENCOUNTER — Other Ambulatory Visit: Payer: Self-pay

## 2015-11-14 DIAGNOSIS — N135 Crossing vessel and stricture of ureter without hydronephrosis: Secondary | ICD-10-CM

## 2015-11-16 NOTE — Patient Instructions (Signed)
Cheryl Abbott  11/16/2015     @PREFPERIOPPHARMACY @   Your procedure is scheduled on 11/22/2015.  Report to [facility] at 6:15 A.M.  Call this number if you have problems the morning of surgery:  [phone]   Remember:  Do not eat food or drink liquids after midnight.  Take these medicines the morning of surgery with A SIP OF WATER : XANAX, NEURONTIN, ZOFRAN, PROTONIX AND PAXIL   Do not wear jewelry, make-up or nail polish.  Do not wear lotions, powders, or perfumes.  You may wear deodorant.  Do not shave 48 hours prior to surgery.  Men may shave face and neck.  Do not bring valuables to the hospital.  [facility] is not responsible for any belongings or valuables.  Contacts, dentures or bridgework may not be worn into surgery.  Leave your suitcase in the car.  After surgery it may be brought to your room.  For patients admitted to the hospital, discharge time will be determined by your treatment team.  Patients discharged the day of surgery will not be allowed to drive home.   Name and phone number of your driver:  FAMILY Special instructions:  N/A  Please read over the following fact sheets that you were given. Care and Recovery After Surgery    General Anesthesia, Adult General anesthesia is a sleep-like state of non-feeling produced by medicines (anesthetics). General anesthesia prevents you from being alert and feeling pain during a medical procedure. Your caregiver may recommend general anesthesia if your procedure:  Is long.  Is painful or uncomfortable.  Would be frightening to see or hear.  Requires you to be still.  Affects your breathing.  Causes significant blood loss. LET YOUR CAREGIVER KNOW ABOUT:  Allergies to food or medicine.  Medicines taken, including vitamins, herbs, eyedrops, over-the-counter medicines, and creams.  Use of steroids (by mouth or creams).  Previous problems with anesthetics or numbing medicines, including problems  experienced by relatives.  History of bleeding problems or blood clots.  Previous surgeries and types of anesthetics received.  Possibility of pregnancy, if this applies.  Use of cigarettes, alcohol, or illegal drugs.  Any health condition(s), especially diabetes, sleep apnea, and high blood pressure. RISKS AND COMPLICATIONS General anesthesia rarely causes complications. However, if complications do occur, they can be life threatening. Complications include:  A lung infection.  A stroke.  A heart attack.  Waking up during the procedure. When this occurs, the patient may be unable to move and communicate that he or she is awake. The patient may feel severe pain. Older adults and adults with serious medical problems are more likely to have complications than adults who are young and healthy. Some complications can be prevented by answering all of your caregiver's questions thoroughly and by following all pre-procedure instructions. It is important to tell your caregiver if any of the pre-procedure instructions, especially those related to diet, were not followed. Any food or liquid in the stomach can cause problems when you are under general anesthesia. BEFORE THE PROCEDURE  Ask your caregiver if you will have to spend the night at the hospital. If you will not have to spend the night, arrange to have an adult drive you and stay with you for 24 hours.  Follow your caregiver's instructions if you are taking dietary supplements or medicines. Your caregiver may tell you to stop taking them or to reduce your dosage.  Do not smoke for as long as possible before your procedure.  If possible, stop smoking 3-6 weeks before the procedure.  Do not take new dietary supplements or medicines within 1 week of your procedure unless your caregiver approves them.  Do not eat within 8 hours of your procedure or as directed by your caregiver. Drink only clear liquids, such as water, black coffee (without  milk or cream), and fruit juices (without pulp).  Do not drink within 3 hours of your procedure or as directed by your caregiver.  You may brush your teeth on the morning of the procedure, but make sure to spit out the toothpaste and water when finished. PROCEDURE  You will receive anesthetics through a mask, through an intravenous (IV) access tube, or through both. A doctor who specializes in anesthesia (anesthesiologist) or a nurse who specializes in anesthesia (nurse anesthetist) or both will stay with you throughout the procedure to make sure you remain unconscious. He or she will also watch your blood pressure, pulse, and oxygen levels to make sure that the anesthetics do not cause any problems. Once you are asleep, a breathing tube or mask may be used to help you breathe. AFTER THE PROCEDURE You will wake up after the procedure is complete. You may be in the room where the procedure was performed or in a recovery area. You may have a sore throat if a breathing tube was used. You may also feel:  Dizzy.  Weak.  Drowsy.  Confused.  Nauseous.  Cold. These are all normal responses and can be expected to last for up to 24 hours after the procedure is complete. A caregiver will tell you when you are ready to go home. This will usually be when you are fully awake and in stable condition.   This information is not intended to replace advice given to you by your health care provider. Make sure you discuss any questions you have with your health care provider.   Document Released: 09/04/2007 Document Revised: 06/18/2014 Document Reviewed: 09/26/2011 Elsevier Interactive Patient Education [employer]. Cystoscopy Cystoscopy is a procedure that is used to help your caregiver diagnose and sometimes treat conditions that affect your lower urinary tract. Your lower urinary tract includes your bladder and the tube through which urine passes from your bladder out of your body (urethra).  Cystoscopy is performed with a thin, tube-shaped instrument (cystoscope). The cystoscope has lenses and a light at the end so that your caregiver can see inside your bladder. The cystoscope is inserted at the entrance of your urethra. Your caregiver guides it through your urethra and into your bladder. There are two main types of cystoscopy:  Flexible cystoscopy (with a flexible cystoscope).  Rigid cystoscopy (with a rigid cystoscope). Cystoscopy may be recommended for many conditions, including:  Urinary tract infections.  Blood in your urine (hematuria).  Loss of bladder control (urinary incontinence) or overactive bladder.  Unusual cells found in a urine sample.  Urinary blockage.  Painful urination. Cystoscopy may also be done to remove a sample of your tissue to be checked under a microscope (biopsy). It may also be done to remove or destroy bladder stones. LET YOUR CAREGIVER KNOW ABOUT:  Allergies to food or medicine.  Medicines taken, including vitamins, herbs, eyedrops, over-the-counter medicines, and creams.  Use of steroids (by mouth or creams).  Previous problems with anesthetics or numbing medicines.  History of bleeding problems or blood clots.  Previous surgery.  Other health problems, including diabetes and kidney problems.  Possibility of pregnancy, if this applies. PROCEDURE The area  around the opening to your urethra will be cleaned. A medicine to numb your urethra (local anesthetic) is used. If a tissue sample or stone is removed during the procedure, you may be given a medicine to make you sleep (general anesthetic). Your caregiver will gently insert the tip of the cystoscope into your urethra. The cystoscope will be slowly glided through your urethra and into your bladder. Sterile fluid will flow through the cystoscope and into your bladder. The fluid will expand and stretch your bladder. This gives your caregiver a better view of your bladder walls. The  procedure lasts about 15-20 minutes. AFTER THE PROCEDURE If a local anesthetic is used, you will be allowed to go home as soon as you are ready. If a general anesthetic is used, you will be taken to a recovery area until you are stable. You may have temporary bleeding and burning on urination.   This information is not intended to replace advice given to you by your health care provider. Make sure you discuss any questions you have with your health care provider.   Document Released: 05/25/2000 Document Revised: 06/18/2014 Document Reviewed: 11/19/2011 Elsevier Interactive Patient Education [employer].

## 2015-11-17 ENCOUNTER — Encounter (HOSPITAL_COMMUNITY)
Admission: RE | Admit: 2015-11-17 | Discharge: 2015-11-17 | Disposition: A | Discharge status: Home / Self Care | Payer: Medicare Other | Source: Ambulatory Visit | Attending: Urology | Admitting: Urology

## 2015-11-17 ENCOUNTER — Encounter (HOSPITAL_COMMUNITY): Payer: Self-pay

## 2015-11-17 DIAGNOSIS — Z01812 Encounter for preprocedural laboratory examination: Secondary | ICD-10-CM | POA: Diagnosis present

## 2015-11-17 HISTORY — DX: Anxiety disorder, unspecified: F41.9

## 2015-11-17 LAB — CBC
HCT: 36.7 % (ref 36.0–46.0)
Hemoglobin: 11.8 g/dL — ABNORMAL LOW (ref 12.0–15.0)
MCH: 28.6 pg (ref 26.0–34.0)
MCHC: 32.2 g/dL (ref 30.0–36.0)
MCV: 88.9 fL (ref 78.0–100.0)
Platelets: 329 10*3/uL (ref 150–400)
RBC: 4.13 MIL/uL (ref 3.87–5.11)
RDW: 22.7 % — ABNORMAL HIGH (ref 11.5–15.5)
WBC: 7.4 10*3/uL (ref 4.0–10.5)

## 2015-11-17 LAB — BASIC METABOLIC PANEL
Anion gap: 5 (ref 5–15)
BUN: 19 mg/dL (ref 6–20)
CO2: 23 mmol/L (ref 22–32)
Calcium: 9.9 mg/dL (ref 8.9–10.3)
Chloride: 104 mmol/L (ref 101–111)
Creatinine, Ser: 1.48 mg/dL — ABNORMAL HIGH (ref 0.44–1.00)
GFR calc Af Amer: 40 mL/min — ABNORMAL LOW (ref >60)
GFR calc non Af Amer: 34 mL/min — ABNORMAL LOW (ref >60)
Glucose, Bld: 106 mg/dL — ABNORMAL HIGH (ref 65–99)
Potassium: 4.6 mmol/L (ref 3.5–5.1)
Sodium: 132 mmol/L — ABNORMAL LOW (ref 135–145)

## 2015-11-17 NOTE — Pre-Procedure Instructions (Signed)
Patient given information to sign up for my chart at home. Instructed that she should not be by herself for 24 hours after surgery. States she will try and find someone.

## 2015-11-18 ENCOUNTER — Other Ambulatory Visit: Payer: Self-pay | Admitting: Family Medicine

## 2015-11-21 NOTE — H&P (Signed)
H&P  Chief Complaint: Right ureteral stricture  History of Present Illness: Cheryl Abbott is a 70 y.o. year old female with a history of recurrent urolithiasis. She has undergone several recent ureteroscopic procedures for obstructive stones. Cheryl Abbott has additionally had urinary retention necessitating, at times, catheter drainage. Recent followup imaging has revealed a right ureteral stricture adjacent to a prior distal right sided stone. She has a stent in place currently. She now presents for cystoscopy, right stent extraction, posible ureteroscopy with balloon dilation of her stricture followed by repeat stent placement.  Past Medical History  Diagnosis Date  . Anxiety disorder   . Hyperlipidemia   . Hypertension   . Depression   . Chronic back pain   . Headache   . Type 2 diabetes mellitus ([place])   . GERD (gastroesophageal reflux disease)   . Anemia   . History of adenomatous polyp of colon     2008  . Fatty liver   . Sigmoid diverticulosis   . Left ureteral stone   . History of kidney stones   . Arthritis   . Hypercalcemia   . OSA treated with BiPAP     per study 2007  . Nephrolithiasis   . Sciatica   . Lumbar disc disease with radiculopathy   . Neuropathy, peripheral ([place])   . 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 Lipoma of arm 01/19/2015  . Anxiety     Past Surgical History  Procedure Laterality Date  . Flexible sigmoidoscopy  09/11/2011    [id number] Internal hemorrhoids  . Esophagogastroduodenoscopy (egd) with propofol N/A 03/17/2013    Procedure: ESOPHAGOGASTRODUODENOSCOPY (EGD) WITH PROPOFOL;  Surgeon: Danie Binder, MD;  Location: [facility];  Service: Endoscopy;  Laterality: N/A;  . Savory dilation N/A 03/17/2013    Procedure: SAVORY DILATION;  Surgeon: Danie Binder, MD;  Location: [facility];  Service: Endoscopy;  Laterality: N/A;  #12.8, 14, 15, 16 dilators used  . Biopsy N/A 03/17/2013    Procedure: GASTRIC BIOPSIES;  Surgeon: Danie Binder, MD;  Location:  [facility];  Service: Endoscopy;  Laterality: N/A;  . Polypectomy N/A 03/17/2013    Procedure: GASTRIC POLYPECTOMY;  Surgeon: Danie Binder, MD;  Location: [facility];  Service: Endoscopy;  Laterality: N/A;  . Colonoscopy  last one 10/02/2011    MOD [facility] TICS, IH, NEXT TCS APR 2018  . Cysto/   right ureterosocopy laser lithotripsy stone extraction  10-13-2004  . Cysto/  right retrograde pyelogram/  placment right ureteral stent  01-10-2010  . Percutaneous nephrostolithotomy Bilateral 12/  2011     [facility]  . Cardiovascular stress test  01-01-2014    normal lexiscan cardiolite/  no ischemia/ infarct/  normal LV function and wall motion ,  ef 81%  . Transthoracic echocardiogram  01-01-2014    mild LVH/  ef [id number]  grade I diastolic dysfunction/  trivial MR, TR, and PR  . Removal right thigh cyst  2006  . Vaginal hysterectomy  1970's  . Cystoscopy with retrograde pyelogram, ureteroscopy and stent placement Left 10/21/2014    Procedure: CYSTOSCOPY WITH RETROGRADE PYELOGRAM, URETEROSCOPY AND STENT PLACEMENT;  Surgeon: Franchot Gallo, MD;  Location[facility];  Service: Urology;  Laterality: Left;  . Holmium laser application Left [id number]    Procedure: HOLMIUM LASER APPLICATION;  Surgeon: Franchot Gallo, MD;  Location: [facility];  Service: Urology;  Laterality: Left;  Marland Kitchen Mass excision Left 03/04/2015    Procedure: EXCISION OF SOFT TISSUE NEOPLASM LEFT ARM;  Surgeon: Aviva Signs, MD;  Location: [facility];  Service: General;  Laterality: Left;  . Esophagogastroduodenoscopy (egd) with propofol N/A 06/10/2015    Procedure: ESOPHAGOGASTRODUODENOSCOPY (EGD) WITH PROPOFOL;  Surgeon: Danie Binder, MD;  [facility];  Service: Endoscopy;  Laterality: N/A;  1245 - pt knows to arrive at 8:30  . Savory dilation N/A 06/10/2015    Procedure: SAVORY DILATION;  Surgeon: Danie Binder, MD;  [facility];  Service: Endoscopy;  Laterality: N/A;  . Biopsy   06/10/2015    Procedure: BIOPSY;  Surgeon: Danie Binder, MD;  [facility];  Service: Endoscopy;;  gastric biopsy  . Cardiac catheterization  05-11-2003  dr Shelva Majestic ([facility])    Abnormal cardiolite/   Normal coronary arteries and normal LVF,  ef  63%  . Cystoscopy/retrograde/ureteroscopy/stone extraction with basket Bilateral 08/02/2015    Procedure: CYSTOSCOPY; BILATERAL RETROGRADE PYELOGRAMS; BILATERAL URETEROSCOPY, STONE EXTRACTION WITH BASKET;  Surgeon: Franchot Gallo, MD;  Location: [facility];  Service: Urology;  Laterality: Bilateral;  . Cystoscopy w/ ureteral stent placement Right 08/19/2015    Procedure: CYSTOSCOPY WITH RETROGRADE PYELOGRAM/URETERAL STENT PLACEMENT;  Surgeon: Franchot Gallo, MD;  Location: [facility];  Service: Urology;  Laterality: Right;  . Cystoscopy/retrograde/ureteroscopy/stone extraction with basket Right 06/28/2015    Procedure: CYSTOSCOPY/RETROGRADE/URETEROSCOPY/STONE EXTRACTION WITH BASKET, RIGHT URETERAL DOUBLE J STENT PLACEMENT;  Surgeon: Franchot Gallo, MD;  Location: [facility];  Service: Urology;  Laterality: Right;  . Holmium laser application Right [id number]    Procedure: HOLMIUM LASER APPLICATION;  Surgeon: Franchot Gallo, MD;  Location: [facility];  Service: Urology;  Laterality: Right;    Home Medications:  No prescriptions prior to admission    Allergies:  Allergies  Allergen Reactions  . Ace Inhibitors Cough  . Keflex [Cephalexin] Diarrhea and Nausea And Vomiting  . Nitrofurantoin Diarrhea and Nausea And Vomiting  . Penicillins Hives, Rash and Other (See Comments)    Blisters on hands and feet Has patient had a PCN reaction causing immediate rash, facial/tongue/throat swelling, SOB or lightheadedness with hypotension: Yes Has patient had a PCN reaction causing severe rash involving mucus membranes or skin necrosis: No Has patient had a PCN reaction that required hospitalization No Has patient had a PCN reaction  occurring within the last 10 years: No If all of the above answers are "NO", then may proceed with Cephalosporin use.     Family History  Problem Relation Age of Onset  . Hypertension Mother   . Diabetes Mother   . Heart failure Mother   . Dementia Mother   . Emphysema Father   . Hypertension Father   . Diabetes Brother   . GER disease Brother   . Cancer      family history   . Diabetes      family history   . Heart defect      famiily history   . Arthritis      family history   . Anesthesia problems Neg Hx   . Hypotension Neg Hx   . Malignant hyperthermia Neg Hx   . Pseudochol deficiency Neg Hx   . Colon cancer Neg Hx   . Hypertension Sister   . Hypertension Sister     Social History:  reports that she quit smoking about 21 years ago. Her smoking use included Cigarettes. She has a 20 pack-year smoking history. She has never used smokeless tobacco. She reports that she does not drink alcohol or use illicit drugs.  ROS:  A complete review of systems was performed.  All systems are negative except for pertinent findings as noted.  Physical Exam:  Vital signs in last 24 hours:   General:  Alert and oriented, No acute distress HEENT: Normocephalic, atraumatic Neck: No JVD or lymphadenopathy Cardiovascular: Regular rate and rhythm Lungs: Clear bilaterally Abdomen: Soft, nontender, nondistended, no abdominal masses Back: No CVA tenderness Extremities: No edema Neurologic: Grossly intact  Laboratory Data:  No results found for this or any previous visit (from the past 24 hour(s)). No results found for this or any previous visit (from the past 240 hour(s)). Creatinine:  Recent Labs  11/17/15 1105  CREATININE 1.48*    Radiologic Imaging: No results found.  Impression/Assessment:  Recurrent urolithiasis, s/p multiple interventions, now w/ right ureteral stricture Plan:  Cysto, right J2 stent extraction, right retrograde ureteropyelogram, possible right  ureteroscopy, balloon dilation of stricture, replacement of J2 stent  Jorja Loa 11/21/2015, 10:03 PM  Lillette Boxer. Jeb Schloemer MD

## 2015-11-22 ENCOUNTER — Ambulatory Visit (HOSPITAL_COMMUNITY): Payer: Medicare Other | Admitting: Anesthesiology

## 2015-11-22 ENCOUNTER — Ambulatory Visit (HOSPITAL_COMMUNITY): Payer: Medicare Other

## 2015-11-22 ENCOUNTER — Ambulatory Visit (HOSPITAL_COMMUNITY)
Admission: RE | Admit: 2015-11-22 | Discharge: 2015-11-22 | Disposition: A | Discharge status: Home / Self Care | Payer: Medicare Other | Source: Ambulatory Visit | Attending: Urology | Admitting: Urology

## 2015-11-22 ENCOUNTER — Encounter (HOSPITAL_COMMUNITY): Payer: Self-pay | Admitting: *Deleted

## 2015-11-22 ENCOUNTER — Encounter (HOSPITAL_COMMUNITY)
Admission: RE | Disposition: A | Discharge status: Home / Self Care | Payer: Self-pay | Source: Ambulatory Visit | Attending: Urology

## 2015-11-22 DIAGNOSIS — K219 Gastro-esophageal reflux disease without esophagitis: Secondary | ICD-10-CM | POA: Insufficient documentation

## 2015-11-22 DIAGNOSIS — F419 Anxiety disorder, unspecified: Secondary | ICD-10-CM | POA: Diagnosis not present

## 2015-11-22 DIAGNOSIS — N131 Hydronephrosis with ureteral stricture, not elsewhere classified: Secondary | ICD-10-CM | POA: Diagnosis not present

## 2015-11-22 DIAGNOSIS — E785 Hyperlipidemia, unspecified: Secondary | ICD-10-CM | POA: Diagnosis not present

## 2015-11-22 DIAGNOSIS — M1990 Unspecified osteoarthritis, unspecified site: Secondary | ICD-10-CM | POA: Diagnosis not present

## 2015-11-22 DIAGNOSIS — F329 Major depressive disorder, single episode, unspecified: Secondary | ICD-10-CM | POA: Diagnosis not present

## 2015-11-22 DIAGNOSIS — Z7984 Long term (current) use of oral hypoglycemic drugs: Secondary | ICD-10-CM | POA: Insufficient documentation

## 2015-11-22 DIAGNOSIS — N135 Crossing vessel and stricture of ureter without hydronephrosis: Secondary | ICD-10-CM

## 2015-11-22 DIAGNOSIS — G4733 Obstructive sleep apnea (adult) (pediatric): Secondary | ICD-10-CM | POA: Diagnosis not present

## 2015-11-22 DIAGNOSIS — E1142 Type 2 diabetes mellitus with diabetic polyneuropathy: Secondary | ICD-10-CM | POA: Insufficient documentation

## 2015-11-22 DIAGNOSIS — Z79899 Other long term (current) drug therapy: Secondary | ICD-10-CM | POA: Diagnosis not present

## 2015-11-22 DIAGNOSIS — Z87442 Personal history of urinary calculi: Secondary | ICD-10-CM | POA: Insufficient documentation

## 2015-11-22 DIAGNOSIS — Z466 Encounter for fitting and adjustment of urinary device: Secondary | ICD-10-CM | POA: Diagnosis not present

## 2015-11-22 DIAGNOSIS — I10 Essential (primary) hypertension: Secondary | ICD-10-CM | POA: Insufficient documentation

## 2015-11-22 DIAGNOSIS — Z87891 Personal history of nicotine dependence: Secondary | ICD-10-CM | POA: Insufficient documentation

## 2015-11-22 HISTORY — PX: CYSTOSCOPY WITH RETROGRADE PYELOGRAM, URETEROSCOPY AND STENT PLACEMENT: SHX5789

## 2015-11-22 LAB — GLUCOSE, CAPILLARY
Glucose-Capillary: 98 mg/dL (ref 65–99)
Glucose-Capillary: 99 mg/dL (ref 65–99)

## 2015-11-22 SURGERY — CYSTOURETEROSCOPY, WITH RETROGRADE PYELOGRAM AND STENT INSERTION
Anesthesia: General | Laterality: Right

## 2015-11-22 MED ORDER — DIATRIZOATE MEGLUMINE 30 % UR SOLN
URETHRAL | Status: DC | PRN
  Administered 2015-11-22: 30 mL

## 2015-11-22 MED ORDER — LIDOCAINE HCL 1 % IJ SOLN
INTRAMUSCULAR | Status: DC | PRN
  Administered 2015-11-22: 40 mg via INTRADERMAL

## 2015-11-22 MED ORDER — ONDANSETRON HCL 4 MG/2ML IJ SOLN
4.0000 mg | Freq: Once | INTRAMUSCULAR | Status: AC
  Administered 2015-11-22: 4 mg via INTRAVENOUS
  Filled 2015-11-22: qty 2

## 2015-11-22 MED ORDER — LIDOCAINE HCL (PF) 1 % IJ SOLN
INTRAMUSCULAR | Status: AC
  Filled 2015-11-22: qty 5

## 2015-11-22 MED ORDER — FENTANYL CITRATE (PF) 100 MCG/2ML IJ SOLN: 25.0000 ug | INTRAMUSCULAR | Status: DC | PRN

## 2015-11-22 MED ORDER — FENTANYL CITRATE (PF) 100 MCG/2ML IJ SOLN
INTRAMUSCULAR | Status: DC | PRN
  Administered 2015-11-22 (×2): 25 ug via INTRAVENOUS

## 2015-11-22 MED ORDER — SODIUM CHLORIDE 0.9 % IR SOLN
Status: DC | PRN
  Administered 2015-11-22 (×2): 3000 mL via INTRAVESICAL

## 2015-11-22 MED ORDER — MIDAZOLAM HCL 2 MG/2ML IJ SOLN
1.0000 mg | INTRAMUSCULAR | Status: DC | PRN
  Administered 2015-11-22: 2 mg via INTRAVENOUS
  Filled 2015-11-22: qty 2

## 2015-11-22 MED ORDER — PROPOFOL 10 MG/ML IV BOLUS
INTRAVENOUS | Status: DC | PRN
  Administered 2015-11-22: 150 mg via INTRAVENOUS

## 2015-11-22 MED ORDER — PROPOFOL 10 MG/ML IV BOLUS
INTRAVENOUS | Status: AC
  Filled 2015-11-22: qty 20

## 2015-11-22 MED ORDER — LACTATED RINGERS IV SOLN
INTRAVENOUS | Status: DC
Start: 2015-11-22 — End: 2015-11-22
  Administered 2015-11-22: 1000 mL via INTRAVENOUS

## 2015-11-22 MED ORDER — CIPROFLOXACIN IN D5W 400 MG/200ML IV SOLN
400.0000 mg | INTRAVENOUS | Status: AC
  Administered 2015-11-22: 400 mg via INTRAVENOUS
  Filled 2015-11-22: qty 200

## 2015-11-22 MED ORDER — FENTANYL CITRATE (PF) 100 MCG/2ML IJ SOLN
25.0000 ug | INTRAMUSCULAR | Status: AC
  Administered 2015-11-22: 25 ug via INTRAVENOUS

## 2015-11-22 MED ORDER — FENTANYL CITRATE (PF) 100 MCG/2ML IJ SOLN
INTRAMUSCULAR | Status: AC
  Filled 2015-11-22: qty 2

## 2015-11-22 MED ORDER — ONDANSETRON HCL 4 MG/2ML IJ SOLN: 4.0000 mg | Freq: Once | INTRAMUSCULAR | Status: DC | PRN

## 2015-11-22 MED ORDER — DIATRIZOATE MEGLUMINE 30 % UR SOLN
URETHRAL | Status: AC
  Filled 2015-11-22: qty 300

## 2015-11-22 MED ORDER — IOPAMIDOL (ISOVUE-300) INJECTION 61%
INTRAVENOUS | Status: AC
  Filled 2015-11-22: qty 50

## 2015-11-22 MED ORDER — CIPROFLOXACIN HCL 250 MG PO TABS: 250.0000 mg | ORAL_TABLET | Freq: Two times a day (BID) | ORAL | Status: DC

## 2015-11-22 SURGICAL SUPPLY — 23 items
BAG DRAIN URO TABLE W/ADPT NS (DRAPE) ×2 IMPLANT
BAG DRN 8 ADPR NS SKTRN CSTL (DRAPE) ×1
BAG HAMPER (MISCELLANEOUS) ×2 IMPLANT
CATH INTERMIT  6FR 70CM (CATHETERS) ×1 IMPLANT
CLOTH BEACON ORANGE TIMEOUT ST (SAFETY) ×2 IMPLANT
DECANTER SPIKE VIAL GLASS SM (MISCELLANEOUS) ×2 IMPLANT
DILATOR UROMAX ULTRA (MISCELLANEOUS) ×1 IMPLANT
GLOVE BIOGEL M 8.0 STRL (GLOVE) ×2 IMPLANT
GLOVE BIOGEL PI IND STRL 7.0 (GLOVE) IMPLANT
GLOVE BIOGEL PI INDICATOR 7.0 (GLOVE) ×3
GLOVE ECLIPSE 6.5 STRL STRAW (GLOVE) ×1 IMPLANT
GLOVE ECLIPSE 7.0 STRL STRAW (GLOVE) ×1 IMPLANT
GOWN STRL REUS W/TWL LRG LVL3 (GOWN DISPOSABLE) ×3 IMPLANT
GOWN STRL REUS W/TWL XL LVL3 (GOWN DISPOSABLE) ×2 IMPLANT
GUIDEWIRE STR DUAL SENSOR (WIRE) ×2 IMPLANT
KIT ROOM TURNOVER AP CYSTO (KITS) ×2 IMPLANT
MANIFOLD NEPTUNE II (INSTRUMENTS) ×2 IMPLANT
PACK CYSTO (CUSTOM PROCEDURE TRAY) ×2 IMPLANT
PAD ARMBOARD 7.5X6 YLW CONV (MISCELLANEOUS) ×2 IMPLANT
STENT CONTOUR 7FRX24 (STENTS) ×1 IMPLANT
TOWEL OR 17X26 4PK STRL BLUE (TOWEL DISPOSABLE) ×3 IMPLANT
WATER STERILE IRR 1000ML POUR (IV SOLUTION) ×2 IMPLANT
WATER STERILE IRR 3000ML UROMA (IV SOLUTION) ×3 IMPLANT

## 2015-11-22 NOTE — Discharge Instructions (Signed)
POSTOPERATIVE CARE AFTER URETEROSCOPY  Stent management  *Stents are often left in after ureteroscopy and stone treatment. If left in, they often cause urinary frequency, urgency, occasional blood in the urine, as well as flank discomfort with urination. These are all expected issues, and should resolve after the stent is removed. *Often times, a small thread is left on the end of the stent, and brought out through the urethra. If so, this is used to remove the stent, making it unnecessary to look in the bladder with a scope in the office to remove the stent. If a thread is left on, did not pull on it until instructed. It is OK to pull string to remove stent on Thursday  Diet  Once you have adequately recovered from anesthesia, you may gradually advance your diet, as tolerated, to your regular diet.  Activities  You may gradually increase your activities to your normal unrestricted level the day following your procedure.  Medications  You should resume all preoperative medications. If you are on aspirin-like compounds, you should not resume these until the blood clears from your urine. If given an antibiotic by the surgeon, take these until they are completed. You may also be given, if you have a stent, medications to decrease the urinary frequency and urgency.  Pain  After ureteroscopy, there may be some pain on the side of the scope. Take your pain medicine for this. Usually, this pain resolves within a day or 2.  Fever  Please report any fever over 100 to the doctor.

## 2015-11-22 NOTE — Anesthesia Postprocedure Evaluation (Signed)
Anesthesia Post Note  Patient: Cheryl Abbott  Procedure(s) Performed: Procedure(s) (LRB): CYSTOSCOPY, RIGHT URETERAL STENT REMOVAL; RIGHT RETROGRADE PYELOGRAM, RIGHT URETEROSCOPY WITH BALLOON DILATION; RIGHT URETERAL STENT PLACEMENT (Right)  Patient location during evaluation: PACU Anesthesia Type: General Level of consciousness: awake and alert Pain management: pain level controlled Vital Signs Assessment: post-procedure vital signs reviewed and stable Respiratory status: spontaneous breathing Cardiovascular status: blood pressure returned to baseline Postop Assessment: no signs of nausea or vomiting Anesthetic complications: no    Last Vitals:  Filed Vitals:   11/22/15 0913 11/22/15 0914  BP:  145/90  Pulse: 83   Temp: 36.7 C   Resp: 20     Last Pain:  Filed Vitals:   11/22/15 0915  PainSc: 0-No pain                 Nyjah Denio

## 2015-11-22 NOTE — Addendum Note (Signed)
Addendum  created 11/22/15 1215 by Vista Deck, CRNA   Modules edited: Notes Section   Notes Section:  File: [id number]

## 2015-11-22 NOTE — Anesthesia Preprocedure Evaluation (Addendum)
Anesthesia Evaluation  Patient identified by MRN, date of birth, ID band Patient awake    Reviewed: Allergy & Precautions, NPO status , Patient's Chart, lab work & pertinent test results, reviewed documented beta blocker date and time   Airway Mallampati: III  TM Distance: >3 FB     Dental  (+) Teeth Intact   Pulmonary former smoker,    breath sounds clear to auscultation       Cardiovascular Exercise Tolerance: Good hypertension, Pt. on medications  Rhythm:Regular Rate:Normal     Neuro/Psych Anxiety Depression    GI/Hepatic GERD  Controlled,  Endo/Other  diabetes, Well Controlled, Type 2, Oral Hypoglycemic Agents  Renal/GU Renal InsufficiencyRenal disease (ureteral stricture)Hx: renal calculi     Musculoskeletal   Abdominal   Peds  Hematology   Anesthesia Other Findings   Reproductive/Obstetrics                            Anesthesia Physical Anesthesia Plan  ASA: III  Anesthesia Plan: General   Post-op Pain Management:    Induction: Intravenous  Airway Management Planned: LMA  Additional Equipment:   Intra-op Plan:   Post-operative Plan: Extubation in OR  Informed Consent: I have reviewed the patients History and Physical, chart, labs and discussed the procedure including the risks, benefits and alternatives for the proposed anesthesia with the patient or authorized representative who has indicated his/her understanding and acceptance.     Plan Discussed with: CRNA, Anesthesiologist and Surgeon  Anesthesia Plan Comments:        Anesthesia Quick Evaluation

## 2015-11-22 NOTE — Op Note (Signed)
Preoperative diagnosis: Right ureteral stricture with hydronephrosis, history of urolithiasis  Postoperative diagnosis: Same  Procedure: Cystoscopy, right double-J stent extraction, right ureteroscopy, balloon dilation of right distal ureteral stricture, placement of 7 French by 24 cm contour double-J stent with string, interpretive fluoroscopy  Surgeon: Narcissus Detwiler  Anesthesia: Gen. with LMA  Specimen: Urine sent for culture  Drains: Double-J stent-contour, 7 French by 24 cm with string  Complications: None  Estimated blood loss: None  Indications: 70 year old female with recurrent urolithiasis necessitating several ureteroscopic procedures over the past year using laser and extraction of stones. She has developed a right distal ureteral stricture with proximal hydronephrosis. She is currently stented. She presents at this time for cystoscopy, right double-J stent extraction, right ureteroscopy do sure that there is no remaining urolithiasis on the right side, balloon dilation of her right distal ureteral stricture as well as stenting. She understands procedure well. She has been on suppressive trimethoprim.  Findings: Right ureteral stricture approximately 5 cm up from the UVJ, just below, radiographically, the right sacral wing. This was easily passed with a ureteroscope. There was proximal hydroureteronephrosis. No other ureteral strictures were noted.  Description of procedure: The patient was properly identified and marked in the holding area and received ciprofloxacin IV. She was then taken to the operating room where general anesthetic was administered with the LMA. She was placed in the dorsolithotomy position. Genitalia and perineum were prepped and draped. Proper timeout was performed.  Cystoscope was advanced into the bladder. The bladder was inspected and found to be normal with the exception of the ureteral stent present on the right. Urine was sent for culture. The stent was then  extracted. A 6 [place] open-ended catheter was then utilized to perform a retrograde ureteropyelogram. Findings were dictated above, but this revealed a normal distal ureter, a stricture/narrowing approximate 5 cm up with proximal hydroureteronephrosis. There was significant pyelocaliectasis with significant blunting of the calyces.  I then passed a guidewire through the open-ended catheter. The cystoscope was removed. I then advanced a 4-1/2 [place] single-channel flexible digital ureteroscope over top of the guidewire into the right renal pelvis. The guidewire was then removed. Careful inspection was made of the renal pelvis as well as the blunted calyces. No urolithiasis was noted. I then extracted the ureteroscope slowly through the ureter. The entire ureter was normal except for the previously mentioned right distal ureteral stricture. This was approximately 1-1/2 cm in length. Once past this, no ureteral abnormalities were noted. The guidewire was then advanced through the ureteroscope up into the renal pelvis where good curl was seen. The ureteroscope was then removed. I then passed a 6 [place] by 15 cm balloon dilator to the area of the ureteral stricture. The stricture was then dilated to 18 atm pressure for approximately 5 minutes. This was done without a waist seen on the balloon. Following this, the balloon was removed over top of the guidewire. The guidewire was passed through the cystoscope, and then cystoscopically a 7 [place] by 24 cm double-J stent, with the string on, was advanced into the right renal pelvis where good curl was seen after the guidewire was removed. Additionally, good curl was seen in the bladder. The string was left on and brought through the urethral meatus. It was trimmed intake to the patient's inner thigh on the right. The bladder was partially drained for voiding trial later on this morning following recovery.  The patient was then awakened. She was taken to the PACU in stable  condition,  having tolerated the procedure well.

## 2015-11-22 NOTE — Transfer of Care (Signed)
Immediate Anesthesia Transfer of Care Note  Patient: Cheryl Abbott  Procedure(s) Performed: Procedure(s): CYSTOSCOPY, RIGHT URETERAL STENT REMOVAL; RIGHT RETROGRADE PYELOGRAM, RIGHT URETEROSCOPY WITH BALLOON DILATION; RIGHT URETERAL STENT PLACEMENT (Right)  Patient Location: PACU  Anesthesia Type:MAC  Level of Consciousness: awake and alert   Airway & Oxygen Therapy: Patient Spontanous Breathing and Patient connected to face mask oxygen  Post-op Assessment: Report given to RN  Post vital signs: Reviewed and stable  Last Vitals:  Filed Vitals:   11/22/15 0730 11/22/15 0830  BP: 143/83 164/96  Pulse:  86  Temp:    Resp: 21 30    Last Pain:  Filed Vitals:   11/22/15 0831  PainSc: 0-No pain      Patients Stated Pain Goal: 8 ([id number])  Complications: No apparent anesthesia complications

## 2015-11-22 NOTE — Anesthesia Procedure Notes (Signed)
Procedure Name: LMA Insertion Date/Time: 11/22/2015 7:41 AM Performed by: Tressie Stalker E Pre-anesthesia Checklist: Patient identified, Patient being monitored, Emergency Drugs available, Timeout performed and Suction available Patient Re-evaluated:Patient Re-evaluated prior to inductionOxygen Delivery Method: Circle System Utilized Preoxygenation: Pre-oxygenation with 100% oxygen Intubation Type: IV induction Ventilation: Mask ventilation without difficulty LMA: LMA inserted LMA Size: 4.0 Number of attempts: 1 Placement Confirmation: positive ETCO2 and breath sounds checked- equal and bilateral

## 2015-11-23 LAB — URINE CULTURE: Culture: >=100000 — AB

## 2015-11-24 ENCOUNTER — Encounter (HOSPITAL_COMMUNITY): Payer: Self-pay | Admitting: Urology

## 2015-11-28 ENCOUNTER — Telehealth: Payer: Self-pay | Admitting: Family Medicine

## 2015-11-28 NOTE — Telephone Encounter (Signed)
Called patient and left message for them to return call at the office   

## 2015-11-28 NOTE — Telephone Encounter (Signed)
Cheryl Abbott is asking for something to be called in for her she states that she thinks she has some kind of fungus on both sides of her neck and underneath one of her breast, please advise?

## 2015-11-29 NOTE — Telephone Encounter (Signed)
Darkened area under her breasts that itch and on the side of her neck. Wants cream called in for it. Please advise

## 2015-11-29 NOTE — Telephone Encounter (Signed)
Christella returned your call Velna Hatchet she said you could reach her at [phone]

## 2015-11-30 ENCOUNTER — Encounter: Payer: Self-pay | Admitting: Family Medicine

## 2015-11-30 MED ORDER — CLOTRIMAZOLE-BETAMETHASONE 1-0.05 % EX CREA: 1.0000 "application " | TOPICAL_CREAM | Freq: Two times a day (BID) | CUTANEOUS | Status: DC

## 2015-11-30 NOTE — Telephone Encounter (Signed)
Med sent pt aware

## 2015-11-30 NOTE — Telephone Encounter (Signed)
Pls send clotrimaz/ betameth twice dailty to affected area for 10 days , then as needed 45 gm refill x 1, and let her know

## 2015-11-30 NOTE — Addendum Note (Signed)
Addended by: Eual Fines on: 11/30/2015 02:27 PM   Modules accepted: Orders

## 2015-12-06 ENCOUNTER — Other Ambulatory Visit: Payer: Self-pay | Admitting: Family Medicine

## 2015-12-14 ENCOUNTER — Encounter: Payer: Self-pay | Admitting: Gastroenterology

## 2015-12-20 ENCOUNTER — Other Ambulatory Visit: Payer: Self-pay | Admitting: Family Medicine

## 2015-12-22 ENCOUNTER — Telehealth: Payer: Self-pay | Admitting: Family Medicine

## 2015-12-22 NOTE — Telephone Encounter (Signed)
Cheryl Abbott left a voicemail on the machine asking for Dr. Moshe Cipro to call her at home , please advise?

## 2015-12-23 NOTE — Telephone Encounter (Signed)
Spoke with patient and she will address concerns at next visit.

## 2015-12-27 ENCOUNTER — Ambulatory Visit: Payer: Medicare Other | Admitting: Urology

## 2016-01-18 DIAGNOSIS — R0902 Hypoxemia: Secondary | ICD-10-CM | POA: Diagnosis not present

## 2016-01-23 ENCOUNTER — Encounter: Payer: Medicare Other | Admitting: Family Medicine

## 2016-01-25 ENCOUNTER — Encounter: Payer: Medicare Other | Admitting: Family Medicine

## 2016-01-27 ENCOUNTER — Emergency Department (HOSPITAL_COMMUNITY): Payer: Medicare Other

## 2016-01-27 ENCOUNTER — Emergency Department (HOSPITAL_COMMUNITY)
Admission: EM | Admit: 2016-01-27 | Discharge: 2016-01-27 | Disposition: A | Discharge status: Home / Self Care | Payer: Medicare Other | Attending: Emergency Medicine | Admitting: Emergency Medicine

## 2016-01-27 ENCOUNTER — Encounter (HOSPITAL_COMMUNITY): Payer: Self-pay | Admitting: Emergency Medicine

## 2016-01-27 DIAGNOSIS — I129 Hypertensive chronic kidney disease with stage 1 through stage 4 chronic kidney disease, or unspecified chronic kidney disease: Secondary | ICD-10-CM | POA: Diagnosis not present

## 2016-01-27 DIAGNOSIS — Z791 Long term (current) use of non-steroidal anti-inflammatories (NSAID): Secondary | ICD-10-CM | POA: Diagnosis not present

## 2016-01-27 DIAGNOSIS — E119 Type 2 diabetes mellitus without complications: Secondary | ICD-10-CM | POA: Diagnosis not present

## 2016-01-27 DIAGNOSIS — Z87891 Personal history of nicotine dependence: Secondary | ICD-10-CM | POA: Insufficient documentation

## 2016-01-27 DIAGNOSIS — R079 Chest pain, unspecified: Secondary | ICD-10-CM | POA: Insufficient documentation

## 2016-01-27 DIAGNOSIS — R0789 Other chest pain: Secondary | ICD-10-CM | POA: Diagnosis not present

## 2016-01-27 DIAGNOSIS — Z79899 Other long term (current) drug therapy: Secondary | ICD-10-CM | POA: Diagnosis not present

## 2016-01-27 DIAGNOSIS — N183 Chronic kidney disease, stage 3 (moderate): Secondary | ICD-10-CM | POA: Insufficient documentation

## 2016-01-27 DIAGNOSIS — R0602 Shortness of breath: Secondary | ICD-10-CM | POA: Diagnosis not present

## 2016-01-27 LAB — CBC
HCT: 38.1 % (ref 36.0–46.0)
Hemoglobin: 12.5 g/dL (ref 12.0–15.0)
MCH: 30 pg (ref 26.0–34.0)
MCHC: 32.8 g/dL (ref 30.0–36.0)
MCV: 91.4 fL (ref 78.0–100.0)
Platelets: 330 10*3/uL (ref 150–400)
RBC: 4.17 MIL/uL (ref 3.87–5.11)
RDW: 15.8 % — ABNORMAL HIGH (ref 11.5–15.5)
WBC: 8.2 10*3/uL (ref 4.0–10.5)

## 2016-01-27 LAB — COMPREHENSIVE METABOLIC PANEL
ALT: 15 U/L (ref 14–54)
AST: 21 U/L (ref 15–41)
Albumin: 4.1 g/dL (ref 3.5–5.0)
Alkaline Phosphatase: 83 U/L (ref 38–126)
Anion gap: 6 (ref 5–15)
BUN: 17 mg/dL (ref 6–20)
CO2: 20 mmol/L — ABNORMAL LOW (ref 22–32)
Calcium: 9.4 mg/dL (ref 8.9–10.3)
Chloride: 107 mmol/L (ref 101–111)
Creatinine, Ser: 1.54 mg/dL — ABNORMAL HIGH (ref 0.44–1.00)
GFR calc Af Amer: 38 mL/min — ABNORMAL LOW (ref >60)
GFR calc non Af Amer: 33 mL/min — ABNORMAL LOW (ref >60)
Glucose, Bld: 124 mg/dL — ABNORMAL HIGH (ref 65–99)
Potassium: 4.2 mmol/L (ref 3.5–5.1)
Sodium: 133 mmol/L — ABNORMAL LOW (ref 135–145)
Total Bilirubin: 0.2 mg/dL — ABNORMAL LOW (ref 0.3–1.2)
Total Protein: 9.1 g/dL — ABNORMAL HIGH (ref 6.5–8.1)

## 2016-01-27 LAB — TROPONIN I: Troponin I: <0.03 ng/mL (ref <0.03)

## 2016-01-27 LAB — D-DIMER, QUANTITATIVE: D-Dimer, Quant: 0.38 ug/mL-FEU (ref 0.00–0.50)

## 2016-01-27 NOTE — ED Provider Notes (Signed)
[place] DEPT Provider Note   CSN: [id number] Arrival date & time: 01/27/16  1705     History   Chief Complaint Chief Complaint  Patient presents with  . Chest Pain    HPI Cheryl Abbott is a 70 y.o. female.  HPI This is a 70 year old female with history of anemia, cerebral microvascular disease, and anxiety who presents today complaining of sharp anterior chest pains that occurred lasting 1-2 seconds approximately 1 time per day over the past week. She denies any injury, cough, dyspnea, fever, chills, history of DVT or PE, or history of coronary artery disease. She states that she has had tests done on her cart before and was told that she had no obstruction of her coronary arteries, she had stress echo and myocardial perfusion imaging done in July 2015. However, and I'm able to review these reports. She reports no other episodes of cardiac evaluation since that time. Pain is not exacerbated by exertion, food, but is worse with movement and coughing Past Medical History:  Diagnosis Date  . Anemia   . Anxiety   . Anxiety disorder   . Arthritis   . 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 Chronic back pain   . Depression   . Fatty liver   . GERD (gastroesophageal reflux disease)   . Headache   . History of adenomatous polyp of colon    2008  . History of kidney stones   . Hypercalcemia   . Hyperlipidemia   . Hypertension   . Left ureteral stone   . Lipoma of arm 01/19/2015  . Lumbar disc disease with radiculopathy   . Nephrolithiasis   . Neuropathy, peripheral ([place])   . OSA treated with BiPAP    per study 2007  . Sciatica   . Sigmoid diverticulosis   . Type 2 diabetes mellitus [facility])     Patient Active Problem List   Diagnosis Date Noted  . Hospital discharge follow-up 09/19/2015  . 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 Fall 08/19/2015  . Generalized weakness 08/19/2015  . Urinary tract infection, site not specified 08/15/2015  . CKD (chronic  kidney disease) stage 3, GFR 30-59 ml/min 07/12/2015  . Constipation 05/19/2015  . Medicare annual wellness visit, subsequent 01/19/2015  . Lipoma of arm 01/19/2015  . Dysuria 01/11/2015  . Back pain   . Allergic cough 12/07/2014  . Anemia 10/14/2014  . Tinea corporis 08/04/2014  . Lumbar back pain with radiculopathy affecting left lower extremity 05/11/2014  . Nocturnal hypoxia 02/04/2014  . Generalized anxiety disorder 05/20/2013  . Dysphagia, idiopathic 03/04/2013  . Depression 08/13/2012  . Abnormal brain scan 02/07/2012  . Thoracic or lumbosacral neuritis or radiculitis, unspecified 12/22/2010  . Hypercalcemia 11/30/2010  . Colon adenomas 05/11/2009  . HEMORRHOIDS 03/15/2009  . GERD 03/15/2009  . FATTY LIVER DISEASE 03/15/2009  . RENAL CALCULUS 03/15/2009  . SLEEP APNEA 09/15/2008  . 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 Hyperlipidemia LDL goal <100 09/17/2007  . Hypertension goal BP (blood pressure) < 130/80 09/17/2007    Past Surgical History:  Procedure Laterality Date  . BIOPSY N/A 03/17/2013   Procedure: GASTRIC BIOPSIES;  Surgeon: Danie Binder, MD;  Location: [facility];  Service: Endoscopy;  Laterality: N/A;  . BIOPSY  06/10/2015   Procedure: BIOPSY;  Surgeon: Danie Binder, MD;  Location[facility];  Service: Endoscopy;;  gastric biopsy  . CARDIAC CATHETERIZATION  05-11-2003  dr Shelva Majestic ([facility])   Abnormal cardiolite/  Normal coronary arteries and normal LVF,  ef  63%  . CARDIOVASCULAR STRESS TEST  01-01-2014   normal lexiscan cardiolite/  no ischemia/ infarct/  normal LV function and wall motion ,  ef 81%  . COLONOSCOPY  last one 10/02/2011   MOD [place] TICS, IH, NEXT TCS APR 2018  . CYSTO/   RIGHT URETEROSOCOPY LASER LITHOTRIPSY STONE EXTRACTION  10-13-2004  . CYSTO/  RIGHT RETROGRADE PYELOGRAM/  PLACMENT RIGHT URETERAL STENT  01-10-2010  . CYSTOSCOPY W/ URETERAL STENT PLACEMENT Right 08/19/2015   Procedure: CYSTOSCOPY WITH  RETROGRADE PYELOGRAM/URETERAL STENT PLACEMENT;  Surgeon: Franchot Gallo, MD;  Location: [facility];  Service: Urology;  Laterality: Right;  . CYSTOSCOPY WITH RETROGRADE PYELOGRAM, URETEROSCOPY AND STENT PLACEMENT Left 10/21/2014   Procedure: CYSTOSCOPY WITH RETROGRADE PYELOGRAM, URETEROSCOPY AND STENT PLACEMENT;  Surgeon: Franchot Gallo, MD;  Location: [facility];  Service: Urology;  Laterality: Left;  . CYSTOSCOPY WITH RETROGRADE PYELOGRAM, URETEROSCOPY AND STENT PLACEMENT Right 11/22/2015   Procedure: CYSTOSCOPY, RIGHT URETERAL STENT REMOVAL; RIGHT RETROGRADE PYELOGRAM, RIGHT URETEROSCOPY WITH BALLOON DILATION; RIGHT URETERAL STENT PLACEMENT;  Surgeon: Franchot Gallo, MD;  Location: [facility];  Service: Urology;  Laterality: Right;  . CYSTOSCOPY/RETROGRADE/URETEROSCOPY/STONE EXTRACTION WITH BASKET Bilateral 08/02/2015   Procedure: CYSTOSCOPY; BILATERAL RETROGRADE PYELOGRAMS; BILATERAL URETEROSCOPY, STONE EXTRACTION WITH BASKET;  Surgeon: Franchot Gallo, MD;  Location: [facility];  Service: Urology;  Laterality: Bilateral;  . CYSTOSCOPY/RETROGRADE/URETEROSCOPY/STONE EXTRACTION WITH BASKET Right 06/28/2015   Procedure: CYSTOSCOPY/RETROGRADE/URETEROSCOPY/STONE EXTRACTION WITH BASKET, RIGHT URETERAL DOUBLE J STENT PLACEMENT;  Surgeon: Franchot Gallo, MD;  Location: [facility];  Service: Urology;  Laterality: Right;  . ESOPHAGOGASTRODUODENOSCOPY (EGD) WITH PROPOFOL N/A 03/17/2013   Procedure: ESOPHAGOGASTRODUODENOSCOPY (EGD) WITH PROPOFOL;  Surgeon: Danie Binder, MD;  Location: [facility];  Service: Endoscopy;  Laterality: N/A;  . ESOPHAGOGASTRODUODENOSCOPY (EGD) WITH PROPOFOL N/A 06/10/2015   Procedure: ESOPHAGOGASTRODUODENOSCOPY (EGD) WITH PROPOFOL;  Surgeon: Danie Binder, MD;  [facility];  Service: Endoscopy;  Laterality: N/A;  1245 - pt knows to arrive at 8:30  . FLEXIBLE SIGMOIDOSCOPY  09/11/2011   [id number] Internal hemorrhoids  . HOLMIUM LASER APPLICATION Left [id number]    Procedure: HOLMIUM LASER APPLICATION;  Surgeon: Franchot Gallo, MD;  [facility]  Service: Urology;  Laterality: Left;  . HOLMIUM LASER APPLICATION Right [id number]   Procedure: HOLMIUM LASER APPLICATION;  Surgeon: Franchot Gallo, MD;  Location: [facility];  Service: Urology;  Laterality: Right;  . MASS EXCISION Left 03/04/2015   Procedure: EXCISION OF SOFT TISSUE NEOPLASM LEFT ARM;  Surgeon: Aviva Signs, MD;  Location: [facility];  Service: General;  Laterality: Left;  . PERCUTANEOUS NEPHROSTOLITHOTOMY Bilateral 12/  2011     [facility]  . POLYPECTOMY N/A 03/17/2013   Procedure: GASTRIC POLYPECTOMY;  Surgeon: Danie Binder, MD;  Location: [facility];  Service: Endoscopy;  Laterality: N/A;  . REMOVAL RIGHT THIGH CYST  2006  . SAVORY DILATION N/A 03/17/2013   Procedure: SAVORY DILATION;  Surgeon: Danie Binder, MD;  Location: [facility];  Service: Endoscopy;  Laterality: N/A;  #12.8, 14, 15, 16 dilators used  . SAVORY DILATION N/A 06/10/2015   Procedure: SAVORY DILATION;  Surgeon: Danie Binder, MD;  [facility];  Service: Endoscopy;  Laterality: N/A;  . TRANSTHORACIC ECHOCARDIOGRAM  01-01-2014   mild LVH/  ef [id number]  grade I diastolic dysfunction/  trivial MR, TR, and PR  . VAGINAL HYSTERECTOMY  1970's    OB History    Gravida Para Term Preterm AB Living  2 2 2     2    SAB TAB Ectopic Multiple Live Births                   Home Medications    Prior to Admission medications   Medication Sig Start Date End Date Taking? Authorizing Provider  ALPRAZolam Duanne Moron) 0.5 MG tablet TAKE 1 TABLET BY MOUTH TWICE A DAY 11/18/15   [name], MD  Cholecalciferol (VITAMIN D3) 5000 units CAPS Take 1 capsule by mouth daily.    Historical Provider, MD  ciprofloxacin (CIPRO) 250 MG tablet Take 1 tablet (250 mg total) by mouth 2 (two) times daily. 11/22/15   Franchot Gallo, MD  clotrimazole-betamethasone (LOTRISONE) cream Apply 1 application topically 2 (two) times  daily. 11/30/15   [name], MD  docusate sodium (COLACE) 100 MG capsule Take 200 mg by mouth at bedtime.     Historical Provider, MD  Ferrous Sulfate (IRON) 325 (65 Fe) MG TABS Take 1 tablet by mouth daily. 08/22/15   Geradine Girt, DO  fluconazole (DIFLUCAN) 150 MG tablet Take 1 tablet (150 mg total) by mouth once. Patient not taking: Reported on 09/28/2015 09/09/15   [name], MD  gabapentin (NEURONTIN) 100 MG capsule TAKE 1 CAPSULE (100 MG TOTAL) BY MOUTH AT BEDTIME. 12/06/15   [name], MD  ibuprofen (ADVIL,MOTRIN) 800 MG tablet TAKE 1 TABLET BY MOUTH EVERY DAY AS NEEDED (MUST LAST 90 DAYS) 12/21/15   [name], MD  lovastatin (MEVACOR) 40 MG tablet TAKE 2 TABLETS (80 MG TOTAL) BY MOUTH AT BEDTIME. 11/03/15   [name], MD  metFORMIN (GLUCOPHAGE) 500 MG tablet Take 2 tablets by mouth 2 (two) times daily. 09/12/15   Historical Provider, MD  ondansetron (ZOFRAN) 4 MG tablet Take 1 tablet (4 mg total) by mouth every 8 (eight) hours as needed for nausea or vomiting. Patient not taking: Reported on 09/28/2015 08/26/15   [name], MD  pantoprazole (PROTONIX) 40 MG tablet Take 1 tablet (40 mg total) by mouth 2 (two) times daily. Patient not taking: Reported on 11/11/2015 08/26/15   [name], MD  PARoxetine (PAXIL) 40 MG tablet TAKE 1 TABLET (40 MG TOTAL) BY MOUTH EVERY MORNING. 12/21/15   [name], MD  Vitamin D, Ergocalciferol, (DRISDOL) 50000 UNITS CAPS capsule TAKE 1 CAPSULE (50,000 UNITS TOTAL) BY MOUTH EVERY 7 (SEVEN) DAYS. Patient not taking: Reported on 11/11/2015 08/30/14   [name], MD    Family History Family History  Problem Relation Age of Onset  . Hypertension Mother   . Diabetes Mother   . Heart failure Mother   . Dementia Mother   . Emphysema Father   . Hypertension Father   . Diabetes Brother   . GER disease Brother   . Hypertension Sister   . Hypertension Sister   . Cancer      family history   .  Diabetes      family history   . Heart defect      famiily history   . Arthritis      family history   . Anesthesia problems Neg Hx   . Hypotension Neg Hx   . Malignant hyperthermia Neg Hx   . Pseudochol deficiency Neg Hx   . Colon cancer Neg Hx     Social History Social History  Substance Use Topics  . Smoking status: Former Smoker    Packs/day: 1.00    Years: 20.00  Types: Cigarettes    Quit date: 09/21/1994  . Smokeless tobacco: Never Used     Comment: Quit x 20 years  . Alcohol use No     Allergies   Ace inhibitors; Keflex [cephalexin]; Nitrofurantoin; and Penicillins   Review of Systems Review of Systems  All other systems reviewed and are negative.    Physical Exam Updated Vital Signs BP 131/73 (BP Location: Left Arm)   Pulse 93   Temp 98.2 F (36.8 C) (Oral)   Resp 18   Ht 5\' 6"  (1.676 m)   Wt 86.2 kg   SpO2 95%   BMI 30.67 kg/m   Physical Exam  Constitutional: She is oriented to person, place, and time. She appears well-developed and well-nourished.  Morbidly obese  HENT:  Head: Normocephalic and atraumatic.  Right Ear: External ear normal.  Left Ear: External ear normal.  Nose: Nose normal.  Mouth/Throat: Oropharynx is clear and moist.  Eyes: EOM are normal. Pupils are equal, round, and reactive to light.  Neck: Normal range of motion.  Cardiovascular: Normal rate, regular rhythm, normal heart sounds and intact distal pulses.   Pulmonary/Chest: Effort normal and breath sounds normal.  Abdominal: Soft. Bowel sounds are normal.  Musculoskeletal: Normal range of motion. She exhibits no edema or deformity.  Neurological: She is alert and oriented to person, place, and time.  Skin: Skin is warm and dry. Capillary refill takes less than 2 seconds.  Psychiatric: She has a normal mood and affect.  Nursing note and vitals reviewed.    ED Treatments / Results  Labs (all labs ordered are listed, but only abnormal results are displayed) Labs  Reviewed  CBC - Abnormal; Notable for the following:       Result Value   RDW 15.8 (*)    All other components within normal limits  COMPREHENSIVE METABOLIC PANEL - Abnormal; Notable for the following:    Sodium 133 (*)    CO2 20 (*)    Glucose, Bld 124 (*)    Creatinine, Ser 1.54 (*)    Total Protein 9.1 (*)    Total Bilirubin 0.2 (*)    GFR calc non Af Amer 33 (*)    GFR calc Af Amer 38 (*)    All other components within normal limits  TROPONIN I  D-DIMER, QUANTITATIVE (NOT AT [facility])    EKG  EKG Interpretation  Date/Time:  Friday January 27 2016 17:16:15 EDT Ventricular Rate:  97 PR Interval:    QRS Duration: 85 QT Interval:  341 QTC Calculation: 434 R Axis:   40 Text Interpretation:  Sinus rhythm Low voltage, precordial leads Abnormal R-wave progression, early transition Confirmed by Zeidy Tayag MD, Andee Poles [phone]) on 01/27/2016 5:32:25 PM       Radiology Dg Chest 2 View  Result Date: 01/27/2016 CLINICAL DATA:  Initial evaluation for acute chest pain and shortness of breath with exertion. EXAM: CHEST  2 VIEW COMPARISON:  Prior radiograph from 08/18/2015. FINDINGS: The cardiac and mediastinal silhouettes are stable in size and contour, and remain within normal limits. The lungs are normally inflated. No airspace consolidation, pleural effusion, or pulmonary edema is identified. There is no pneumothorax. No acute osseous abnormality identified. IMPRESSION: No active cardiopulmonary disease. Electronically Signed   By: Jeannine Boga M.D.   On: 01/27/2016 18:08    Procedures Procedures (including critical care time)  Medications Ordered in ED Medications - No data to display   Initial Impression / Assessment and Plan / ED Course  I  have reviewed the triage vital signs and the nursing notes.  Pertinent labs & imaging results that were available during my care of the patient were reviewed by me and considered in my medical decision making (see chart for  details).  Clinical Course    This is a 70 year old female with chest pain consistent with chest wall pain. It is short in duration (see 1-2 seconds) nature and is not increased with exertion, or associated with dyspnea, or diaphoresis. Although patient has multiple risk factors, it does not appear to be cardiac in nature. EKG here shows no evidence of acute ischemia and her troponins are negative. She does complain of some weakness and had a d-dimer checked that was also normal. Patient has remained hemodynamically stable here. She is discharged to home. We discussed return precautions and need for close follow-up and she voices understanding   Vitals:   01/27/16 1730 01/27/16 1933  BP: 139/78 126/61  Pulse: 96 90  Resp: 24 25  Temp:      Final Clinical Impressions(s) / ED Diagnoses   Final diagnoses:  Chest pain, unspecified chest pain type    New Prescriptions New Prescriptions   No medications on file     Pattricia Boss, MD 01/27/16 [id number]

## 2016-01-27 NOTE — ED Triage Notes (Signed)
Pt states she has been having pain in central chest/epigastric area for over 2 weeks intermittently.

## 2016-02-01 ENCOUNTER — Ambulatory Visit (INDEPENDENT_AMBULATORY_CARE_PROVIDER_SITE_OTHER): Payer: Medicare Other | Admitting: Gastroenterology

## 2016-02-01 ENCOUNTER — Other Ambulatory Visit: Payer: Self-pay

## 2016-02-01 ENCOUNTER — Other Ambulatory Visit: Payer: Self-pay | Admitting: Gastroenterology

## 2016-02-01 ENCOUNTER — Telehealth: Payer: Self-pay

## 2016-02-01 ENCOUNTER — Encounter: Payer: Self-pay | Admitting: Gastroenterology

## 2016-02-01 DIAGNOSIS — E739 Lactose intolerance, unspecified: Secondary | ICD-10-CM | POA: Insufficient documentation

## 2016-02-01 DIAGNOSIS — K219 Gastro-esophageal reflux disease without esophagitis: Secondary | ICD-10-CM | POA: Diagnosis not present

## 2016-02-01 DIAGNOSIS — K5901 Slow transit constipation: Secondary | ICD-10-CM | POA: Diagnosis not present

## 2016-02-01 DIAGNOSIS — R1310 Dysphagia, unspecified: Secondary | ICD-10-CM

## 2016-02-01 HISTORY — DX: Lactose intolerance, unspecified: E73.9

## 2016-02-01 NOTE — Assessment & Plan Note (Signed)
WEIGHT UP. LAST HFP NL AUG 2017.  CONTINUE TO MONITOR SYMPTOMS/WEIGHT.

## 2016-02-01 NOTE — Progress Notes (Signed)
ON RECALL  °

## 2016-02-01 NOTE — Assessment & Plan Note (Signed)
SYMPTOMS FAIRLY WELL CONTROLLED BY DIET.  CONTINUE TO MONITOR SYMPTOMS.

## 2016-02-01 NOTE — Assessment & Plan Note (Signed)
SYMPTOMS NOT IDEALLY CONTROLLED. GETS STRANGLED ON LIQUIDS.  COMPLETE MODIFIED BARIUM SWALLOW. FOLLOW UP IN 6 MOS.

## 2016-02-01 NOTE — Assessment & Plan Note (Signed)
CONSUMES DAIRY REGULARLY. HER BROTHER CAN'T TOLERATE MILK PRODUCTS AT ALL.   DISCUSSED LACTOSE INTOLERANCE AND CLINICAL PRESENTATION. TAKE 2-3 LACTASE PILLS WITH MEALS or milk UP TO THREE TIMES A DAY to prevent loose stool. FOLLOW UP IN 6 MOS.

## 2016-02-01 NOTE — Telephone Encounter (Signed)
Called and informed pt of MBS being scheduled for 02/06/16 at [facility] at 1:00pm. Arrive at 12:45pm.

## 2016-02-01 NOTE — Patient Instructions (Addendum)
Complete your modified barium swallow.  TAKE 2-3 LACTASE PILLS WITH MEALS or milk UP TO THREE TIMES A DAY to prevent loose stool.  CONTINUE STOOL SOFTENERS TO PREVENT CONSTIPATION.  FOLLOW UP IN 6 MOS.

## 2016-02-01 NOTE — Progress Notes (Signed)
cc'ed to pcp °
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
## 2016-02-01 NOTE — Assessment & Plan Note (Signed)
INTERMITTENT-SYMPTOMS FAIRLY WELL CONTROLLED WITH STOOL SOFTENER.   CONTINUE STOOL SOFTENERS TO PREVENT CONSTIPATION. FOLLOW UP IN 6 MOS.

## 2016-02-02 NOTE — Telephone Encounter (Signed)
Patient called and asked that we cancel her appt at [facility] for her "swallowing test" she has a lot going on right now and she will call back to reschedule.

## 2016-02-02 NOTE — Telephone Encounter (Signed)
Appt cancelled

## 2016-02-03 ENCOUNTER — Other Ambulatory Visit (HOSPITAL_COMMUNITY): Payer: Self-pay | Admitting: Family Medicine

## 2016-02-03 DIAGNOSIS — R1310 Dysphagia, unspecified: Secondary | ICD-10-CM

## 2016-02-06 ENCOUNTER — Ambulatory Visit (HOSPITAL_COMMUNITY): Payer: Medicare Other

## 2016-02-14 ENCOUNTER — Telehealth: Payer: Self-pay | Admitting: Family Medicine

## 2016-02-14 NOTE — Telephone Encounter (Signed)
I called the pt to reschedule her AWV because Dr. Moshe Cipro would be out of town. She didn't want to reschedule because she didn't believe the visit was necessary.   However, she does want to come pick up a new meter because hers doesn't work anymore.

## 2016-02-14 NOTE — Telephone Encounter (Signed)
Noted  

## 2016-02-16 ENCOUNTER — Other Ambulatory Visit: Payer: Self-pay | Admitting: Family Medicine

## 2016-02-18 ENCOUNTER — Encounter (HOSPITAL_COMMUNITY): Payer: Self-pay | Admitting: Emergency Medicine

## 2016-02-18 ENCOUNTER — Emergency Department (HOSPITAL_COMMUNITY)
Admission: EM | Admit: 2016-02-18 | Discharge: 2016-02-18 | Disposition: A | Discharge status: Home / Self Care | Payer: Medicare Other | Attending: Emergency Medicine | Admitting: Emergency Medicine

## 2016-02-18 DIAGNOSIS — E1122 Type 2 diabetes mellitus with diabetic chronic kidney disease: Secondary | ICD-10-CM | POA: Diagnosis not present

## 2016-02-18 DIAGNOSIS — Z79899 Other long term (current) drug therapy: Secondary | ICD-10-CM | POA: Insufficient documentation

## 2016-02-18 DIAGNOSIS — R51 Headache: Secondary | ICD-10-CM | POA: Diagnosis present

## 2016-02-18 DIAGNOSIS — I129 Hypertensive chronic kidney disease with stage 1 through stage 4 chronic kidney disease, or unspecified chronic kidney disease: Secondary | ICD-10-CM | POA: Diagnosis not present

## 2016-02-18 DIAGNOSIS — Z87891 Personal history of nicotine dependence: Secondary | ICD-10-CM | POA: Diagnosis not present

## 2016-02-18 DIAGNOSIS — Z7982 Long term (current) use of aspirin: Secondary | ICD-10-CM | POA: Insufficient documentation

## 2016-02-18 DIAGNOSIS — N183 Chronic kidney disease, stage 3 (moderate): Secondary | ICD-10-CM | POA: Insufficient documentation

## 2016-02-18 DIAGNOSIS — I10 Essential (primary) hypertension: Secondary | ICD-10-CM | POA: Diagnosis not present

## 2016-02-18 DIAGNOSIS — G44209 Tension-type headache, unspecified, not intractable: Secondary | ICD-10-CM | POA: Diagnosis not present

## 2016-02-18 DIAGNOSIS — R0902 Hypoxemia: Secondary | ICD-10-CM | POA: Diagnosis not present

## 2016-02-18 DIAGNOSIS — G44219 Episodic tension-type headache, not intractable: Secondary | ICD-10-CM

## 2016-02-18 LAB — CBC WITH DIFFERENTIAL/PLATELET
Basophils Absolute: 0 10*3/uL (ref 0.0–0.1)
Basophils Relative: 0 %
Eosinophils Absolute: 0.2 10*3/uL (ref 0.0–0.7)
Eosinophils Relative: 2 %
HCT: 36.8 % (ref 36.0–46.0)
Hemoglobin: 11.9 g/dL — ABNORMAL LOW (ref 12.0–15.0)
Lymphocytes Relative: 31 %
Lymphs Abs: 2.4 10*3/uL (ref 0.7–4.0)
MCH: 29.8 pg (ref 26.0–34.0)
MCHC: 32.3 g/dL (ref 30.0–36.0)
MCV: 92 fL (ref 78.0–100.0)
Monocytes Absolute: 0.7 10*3/uL (ref 0.1–1.0)
Monocytes Relative: 8 %
Neutro Abs: 4.5 10*3/uL (ref 1.7–7.7)
Neutrophils Relative %: 59 %
Platelets: 345 10*3/uL (ref 150–400)
RBC: 4 MIL/uL (ref 3.87–5.11)
RDW: 16.2 % — ABNORMAL HIGH (ref 11.5–15.5)
WBC: 7.7 10*3/uL (ref 4.0–10.5)

## 2016-02-18 LAB — BASIC METABOLIC PANEL
Anion gap: 9 (ref 5–15)
BUN: 27 mg/dL — ABNORMAL HIGH (ref 6–20)
CO2: 23 mmol/L (ref 22–32)
Calcium: 9.9 mg/dL (ref 8.9–10.3)
Chloride: 103 mmol/L (ref 101–111)
Creatinine, Ser: 1.45 mg/dL — ABNORMAL HIGH (ref 0.44–1.00)
GFR calc Af Amer: 41 mL/min — ABNORMAL LOW (ref >60)
GFR calc non Af Amer: 35 mL/min — ABNORMAL LOW (ref >60)
Glucose, Bld: 97 mg/dL (ref 65–99)
Potassium: 3.9 mmol/L (ref 3.5–5.1)
Sodium: 135 mmol/L (ref 135–145)

## 2016-02-18 MED ORDER — ACETAMINOPHEN 500 MG PO TABS
1000.0000 mg | ORAL_TABLET | Freq: Once | ORAL | Status: AC
  Administered 2016-02-18: 1000 mg via ORAL
  Filled 2016-02-18: qty 2

## 2016-02-18 NOTE — ED Provider Notes (Signed)
[facility] DEPT Provider Note   CSN: [id number] Arrival date & time: 02/18/16  1857     History   Chief Complaint Chief Complaint  Patient presents with  . Headache    HPI Cheryl Abbott is a 70 y.o. female with past medical history as outlined below presenting for evaluation of elevated blood pressure and headache.  She endorses occasional intermittent posterior headache which generally responds to tylenol (last dose taken yesterday evening).  She last checked her bp yesterday at [facility] and it was 152/92.  She has concerns about being taken off of both her norvasc and her metformin about 2 months ago as her pcp states she does not need them anymore.  She denies chest pain, sob, palpitations, vision changes, peripheral edema.  She endorses a current mild posterior headache, denies neck pain or stiffness, focal weakness, vision or speech problems.  She does endorse occasional fleeting episodes of lightheadedness (denies vertigo).  The history is provided by the patient.    Past Medical History:  Diagnosis Date  . Anemia   . Anxiety   . Anxiety disorder   . Arthritis   . 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 Chronic back pain   . Depression   . Fatty liver   . GERD (gastroesophageal reflux disease)   . Headache   . History of adenomatous polyp of colon    2008  . History of kidney stones   . Hypercalcemia   . Hyperlipidemia   . Hypertension   . Left ureteral stone   . Lipoma of arm 01/19/2015  . Lumbar disc disease with radiculopathy   . Nephrolithiasis   . Neuropathy, peripheral ([place])   . OSA treated with BiPAP    per study 2007  . Sciatica   . Sigmoid diverticulosis   . Type 2 diabetes mellitus [facility])     Patient Active Problem List   Diagnosis Date Noted  . Lactose intolerance in adult 02/01/2016  . Hospital discharge follow-up 09/19/2015  . 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 Fall 08/19/2015  . Generalized weakness 08/19/2015  . Urinary  tract infection, site not specified 08/15/2015  . CKD (chronic kidney disease) stage 3, GFR 30-59 ml/min 07/12/2015  . Constipation 05/19/2015  . Medicare annual wellness visit, subsequent 01/19/2015  . Lipoma of arm 01/19/2015  . Dysuria 01/11/2015  . Back pain   . Allergic cough 12/07/2014  . Anemia 10/14/2014  . Tinea corporis 08/04/2014  . Lumbar back pain with radiculopathy affecting left lower extremity 05/11/2014  . Nocturnal hypoxia 02/04/2014  . Generalized anxiety disorder 05/20/2013  . Dysphagia, idiopathic 03/04/2013  . Depression 08/13/2012  . Abnormal brain scan 02/07/2012  . Thoracic or lumbosacral neuritis or radiculitis, unspecified 12/22/2010  . Hypercalcemia 11/30/2010  . Colon adenomas 05/11/2009  . HEMORRHOIDS 03/15/2009  . GERD 03/15/2009  . FATTY LIVER DISEASE 03/15/2009  . RENAL CALCULUS 03/15/2009  . SLEEP APNEA 09/15/2008  . 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 Hyperlipidemia LDL goal <100 09/17/2007  . Hypertension goal BP (blood pressure) < 130/80 09/17/2007    Past Surgical History:  Procedure Laterality Date  . BIOPSY N/A 03/17/2013   Procedure: GASTRIC BIOPSIES;  Surgeon: Danie Binder, MD;  Location: [facility];  Service: Endoscopy;  Laterality: N/A;  . BIOPSY  06/10/2015   Procedure: BIOPSY;  Surgeon: Danie Binder, MD;  [facility];  Service: Endoscopy;;  gastric biopsy  . CARDIAC CATHETERIZATION  05-11-2003  dr Shelva [facility]estic [facility]   Abnormal cardiolite/   Normal coronary arteries and normal LVF,  ef  63%  . CARDIOVASCULAR STRESS TEST  01-01-2014   normal lexiscan cardiolite/  no ischemia/ infarct/  normal LV function and wall motion ,  ef 81%  . COLONOSCOPY  last one 10/02/2011   MOD [place] TICS, IH, NEXT TCS APR 2018  . CYSTO/   RIGHT URETEROSOCOPY LASER LITHOTRIPSY STONE EXTRACTION  10-13-2004  . CYSTO/  RIGHT RETROGRADE PYELOGRAM/  PLACMENT RIGHT URETERAL STENT  01-10-2010  . CYSTOSCOPY W/  URETERAL STENT PLACEMENT Right 08/19/2015   Procedure: CYSTOSCOPY WITH RETROGRADE PYELOGRAM/URETERAL STENT PLACEMENT;  Surgeon: Franchot Gallo, MD;  Location: [facility];  Service: Urology;  Laterality: Right;  . CYSTOSCOPY WITH RETROGRADE PYELOGRAM, URETEROSCOPY AND STENT PLACEMENT Left 10/21/2014   Procedure: CYSTOSCOPY WITH RETROGRADE PYELOGRAM, URETEROSCOPY AND STENT PLACEMENT;  Surgeon: Franchot Gallo, MD;  Location: [facility];  Service: Urology;  Laterality: Left;  . CYSTOSCOPY WITH RETROGRADE PYELOGRAM, URETEROSCOPY AND STENT PLACEMENT Right 11/22/2015   Procedure: CYSTOSCOPY, RIGHT URETERAL STENT REMOVAL; RIGHT RETROGRADE PYELOGRAM, RIGHT URETEROSCOPY WITH BALLOON DILATION; RIGHT URETERAL STENT PLACEMENT;  Surgeon: Franchot Gallo, MD;  Location: [facility];  Service: Urology;  Laterality: Right;  . CYSTOSCOPY/RETROGRADE/URETEROSCOPY/STONE EXTRACTION WITH BASKET Bilateral 08/02/2015   Procedure: CYSTOSCOPY; BILATERAL RETROGRADE PYELOGRAMS; BILATERAL URETEROSCOPY, STONE EXTRACTION WITH BASKET;  Surgeon: Franchot Gallo, MD;  Location: [facility];  Service: Urology;  Laterality: Bilateral;  . CYSTOSCOPY/RETROGRADE/URETEROSCOPY/STONE EXTRACTION WITH BASKET Right 06/28/2015   Procedure: CYSTOSCOPY/RETROGRADE/URETEROSCOPY/STONE EXTRACTION WITH BASKET, RIGHT URETERAL DOUBLE J STENT PLACEMENT;  Surgeon: Franchot Gallo, MD;  [facility];  Service: Urology;  Laterality: Right;  . ESOPHAGOGASTRODUODENOSCOPY (EGD) WITH PROPOFOL N/A 03/17/2013   Procedure: ESOPHAGOGASTRODUODENOSCOPY (EGD) WITH PROPOFOL;  Surgeon: Danie Binder, MD;  Location: [facility];  Service: Endoscopy;  Laterality: N/A;  . ESOPHAGOGASTRODUODENOSCOPY (EGD) WITH PROPOFOL N/A 06/10/2015   Procedure: ESOPHAGOGASTRODUODENOSCOPY (EGD) WITH PROPOFOL;  Surgeon: Danie Binder, MD;  [facility];  Service: Endoscopy;  Laterality: N/A;  1245 - pt knows to arrive at 8:30  . FLEXIBLE SIGMOIDOSCOPY  09/11/2011    [id number] Internal hemorrhoids  . HOLMIUM LASER APPLICATION Left 09/24/6061   Procedure: HOLMIUM LASER APPLICATION;  Surgeon: Franchot Gallo, MD;  Location: [facility];  Service: Urology;  Laterality: Left;  . HOLMIUM LASER APPLICATION Right 0/16/0109   Procedure: HOLMIUM LASER APPLICATION;  Surgeon: Franchot Gallo, MD;  Location[facility];  Service: Urology;  Laterality: Right;  . MASS EXCISION Left 03/04/2015   Procedure: EXCISION OF SOFT TISSUE NEOPLASM LEFT ARM;  Surgeon: Aviva Signs, MD;  Location: [facility];  Service: General;  Laterality: Left;  . PERCUTANEOUS NEPHROSTOLITHOTOMY Bilateral 12/  2011     [facility]  . POLYPECTOMY N/A 03/17/2013   Procedure: GASTRIC POLYPECTOMY;  Surgeon: Danie Binder, MD;  Location: [facility];  Service: Endoscopy;  Laterality: N/A;  . REMOVAL RIGHT THIGH CYST  2006  . SAVORY DILATION N/A 03/17/2013   Procedure: SAVORY DILATION;  Surgeon: Danie Binder, MD;  Location: [facility];  Service: Endoscopy;  Laterality: N/A;  #12.8, 14, 15, 16 dilators used  . SAVORY DILATION N/A 06/10/2015   Procedure: SAVORY DILATION;  Surgeon: Danie Binder, MD;  [facility];  Service: Endoscopy;  Laterality: N/A;  . TRANSTHORACIC ECHOCARDIOGRAM  01-01-2014   mild LVH/  ef 32-35%/  grade I diastolic dysfunction/  trivial MR, TR, and PR  . VAGINAL HYSTERECTOMY  1970's    OB History  Gravida Para Term Preterm AB Living   2 2 2     2    SAB TAB Ectopic Multiple Live Births                   Home Medications    Prior to Admission medications   Medication Sig Start Date End Date Taking? Authorizing Provider  ALPRAZolam Duanne Moron) 0.5 MG tablet Take 0.5 mg by mouth 2 (two) times daily.   Yes Historical Provider, MD  aspirin EC 81 MG tablet Take 81 mg by mouth daily.   Yes Historical Provider, MD  clotrimazole-betamethasone (LOTRISONE) cream Apply 1 application topically 2 (two) times daily as needed (for rash).   Yes Historical Provider, MD    docusate sodium (COLACE) 100 MG capsule Take 200 mg by mouth at bedtime.    Yes Historical Provider, MD  lovastatin (MEVACOR) 40 MG tablet Take 80 mg by mouth at bedtime.   Yes Historical Provider, MD  ondansetron (ZOFRAN) 4 MG tablet Take 1 tablet (4 mg total) by mouth every 8 (eight) hours as needed for nausea or vomiting. 08/26/15  Yes [name], MD  pantoprazole (PROTONIX) 40 MG tablet Take 1 tablet (40 mg total) by mouth 2 (two) times daily. 08/26/15  Yes [name], MD  PARoxetine (PAXIL) 40 MG tablet Take 40 mg by mouth daily.   Yes Historical Provider, MD  Vitamin D, Ergocalciferol, (DRISDOL) 50000 units CAPS capsule Take 50,000 Units by mouth every 7 (seven) days.   Yes Historical Provider, MD  ciprofloxacin (CIPRO) 250 MG tablet Take 1 tablet (250 mg total) by mouth 2 (two) times daily. Patient not taking: Reported on 02/01/2016 11/22/15   Franchot Gallo, MD    Family History Family History  Problem Relation Age of Onset  . Hypertension Mother   . Diabetes Mother   . Heart failure Mother   . Dementia Mother   . Emphysema Father   . Hypertension Father   . Diabetes Brother   . GER disease Brother   . Hypertension Sister   . Hypertension Sister   . Cancer      family history   . Diabetes      family history   . Heart defect      famiily history   . Arthritis      family history   . Anesthesia problems Neg Hx   . Hypotension Neg Hx   . Malignant hyperthermia Neg Hx   . Pseudochol deficiency Neg Hx   . Colon cancer Neg Hx     Social History Social History  Substance Use Topics  . Smoking status: Former Smoker    Packs/day: 1.00    Years: 20.00    Types: Cigarettes    Quit date: 09/21/1994  . Smokeless tobacco: Never Used     Comment: Quit x 20 years  . Alcohol use No     Allergies   Ace inhibitors; Keflex [cephalexin]; Nitrofurantoin; and Penicillins   Review of Systems Review of Systems  Constitutional: Negative for fever.  HENT:  Negative for congestion and sore throat.   Eyes: Negative.  Negative for visual disturbance.  Respiratory: Negative for chest tightness and shortness of breath.   Cardiovascular: Negative for chest pain, palpitations and leg swelling.  Gastrointestinal: Negative for abdominal pain and nausea.  Genitourinary: Negative.   Musculoskeletal: Negative for arthralgias, joint swelling and neck pain.  Skin: Negative.  Negative for rash and wound.  Neurological: Positive for headaches. Negative for dizziness,  weakness, light-headedness and numbness.  Psychiatric/Behavioral: Negative.      Physical Exam Updated Vital Signs BP 144/94   Pulse 98   Temp 97.9 F (36.6 C) (Oral)   Resp 17   Ht 5' 6.5" (1.689 m)   Wt 86.2 kg   SpO2 97%   BMI 30.21 kg/m   Physical Exam  Constitutional: She appears well-developed and well-nourished.  HENT:  Head: Normocephalic and atraumatic.  Eyes: Conjunctivae are normal.  Neck: Normal range of motion.  Cardiovascular: Normal rate, regular rhythm, normal heart sounds and intact distal pulses.   Pulmonary/Chest: Effort normal and breath sounds normal. She has no wheezes.  Abdominal: Soft. Bowel sounds are normal. There is no tenderness.  Musculoskeletal: Normal range of motion. She exhibits no edema.  Neurological: She is alert.  Skin: Skin is warm and dry.  Psychiatric: She has a normal mood and affect.  Nursing note and vitals reviewed.     ED Treatments / Results  Labs (all labs ordered are listed, but only abnormal results are displayed) Labs Reviewed  CBC WITH DIFFERENTIAL/PLATELET - Abnormal; Notable for the following:       Result Value   Hemoglobin 11.9 (*)    RDW 16.2 (*)    All other components within normal limits  BASIC METABOLIC PANEL - Abnormal; Notable for the following:    BUN 27 (*)    Creatinine, Ser 1.45 (*)    GFR calc non Af Amer 35 (*)    GFR calc Af Amer 41 (*)    All other components within normal limits    EKG  EKG  Interpretation None       Radiology No results found.  Procedures Procedures (including critical care time)  Medications Ordered in ED Medications  acetaminophen (TYLENOL) tablet 1,000 mg (1,000 mg Oral Given 02/18/16 2120)     Initial Impression / Assessment and Plan / ED Course  I have reviewed the triage vital signs and the nursing notes.  Pertinent labs & imaging results that were available during my care of the patient were reviewed by me and considered in my medical decision making (see chart for details).  Clinical Course    Pt with modestly elevated blood pressure on presentation, labs ok, glucose normal.  Headache resolved with tylenol, no neuro deficits on exam.  She was advised f/u with pcp for recheck, in the interim, keep a log of her blood glucoses for Dr.Simpson.  The patient appears reasonably screened and/or stabilized for discharge and I doubt any other medical condition or other [facility] requiring further screening, evaluation, or treatment in the ED at this time prior to discharge.  Pt was seen by Dr. Rogene Houston prior to dc home.  Final Clinical Impressions(s) / ED Diagnoses   Final diagnoses:  Essential hypertension  Episodic tension-type headache, not intractable    New Prescriptions New Prescriptions   No medications on file     Evalee Jefferson, Hershal Coria 02/18/16 2337    Fredia Sorrow, MD 03/08/16 [phone]

## 2016-02-18 NOTE — ED Provider Notes (Signed)
Medical screening examination/treatment/procedure(s) were conducted as a shared visit with non-physician practitioner(s) and myself.  I personally evaluated the patient during the encounter.   EKG Interpretation None       Results for orders placed or performed during the hospital encounter of 02/18/16  CBC with Differential  Result Value Ref Range   WBC 7.7 4.0 - 10.5 K/uL   RBC 4.00 3.87 - 5.11 MIL/uL   Hemoglobin 11.9 (L) 12.0 - 15.0 g/dL   HCT 36.8 36.0 - 46.0 %   MCV 92.0 78.0 - 100.0 fL   MCH 29.8 26.0 - 34.0 pg   MCHC 32.3 30.0 - 36.0 g/dL   RDW 16.2 (H) 11.5 - 15.5 %   Platelets 345 150 - 400 K/uL   Neutrophils Relative % 59 %   Neutro Abs 4.5 1.7 - 7.7 K/uL   Lymphocytes Relative 31 %   Lymphs Abs 2.4 0.7 - 4.0 K/uL   Monocytes Relative 8 %   Monocytes Absolute 0.7 0.1 - 1.0 K/uL   Eosinophils Relative 2 %   Eosinophils Absolute 0.2 0.0 - 0.7 K/uL   Basophils Relative 0 %   Basophils Absolute 0.0 0.0 - 0.1 K/uL  Basic metabolic panel  Result Value Ref Range   Sodium 135 135 - 145 mmol/L   Potassium 3.9 3.5 - 5.1 mmol/L   Chloride 103 101 - 111 mmol/L   CO2 23 22 - 32 mmol/L   Glucose, Bld 97 65 - 99 mg/dL   BUN 27 (H) 6 - 20 mg/dL   Creatinine, Ser 1.45 (H) 0.44 - 1.00 mg/dL   Calcium 9.9 8.9 - 10.3 mg/dL   GFR calc non Af Amer 35 (L) >60 mL/min   GFR calc Af Amer 41 (L) >60 mL/min   Anion gap 9 5 - 15     The patient seen by me along with the physician assistant. Patient with the complaint of intermittent headache not severe patient's main concern is that her blood pressure has been running high and is concerned that her blood sugars are running high. Tula Nakayama her primary care doctor has cut back on both sets of medications. Workup here shows no acute findings no neuro focal deficits. Pressure running a little above 161 systolic and a little above 90 diastolic. Blood sugar is fine. Would recommend a blood sugar log and follow back up with Dr. Moshe Cipro.   Fredia Sorrow, MD 02/18/16 [phone]

## 2016-02-18 NOTE — ED Notes (Signed)
Patient verbalizes understanding of discharge instructions, home care and follow up care. Patient ambulatory out of department at this time. 

## 2016-02-18 NOTE — ED Triage Notes (Addendum)
Pt reports her blood pressure has been running high for the past few days and she has a headache.  Her pcp took her off bp meds a few months ago.

## 2016-02-21 ENCOUNTER — Ambulatory Visit: Payer: Medicare Other

## 2016-02-21 ENCOUNTER — Other Ambulatory Visit: Payer: Self-pay | Admitting: Family Medicine

## 2016-02-21 MED ORDER — ALPRAZOLAM 0.5 MG PO TABS: 0.5000 mg | ORAL_TABLET | Freq: Two times a day (BID) | ORAL | 0 refills | Status: DC

## 2016-02-24 ENCOUNTER — Encounter: Payer: Self-pay | Admitting: Family Medicine

## 2016-02-24 ENCOUNTER — Ambulatory Visit (INDEPENDENT_AMBULATORY_CARE_PROVIDER_SITE_OTHER): Payer: Medicare Other | Admitting: Family Medicine

## 2016-02-24 VITALS — BP 130/84 | HR 72 | Resp 14 | Ht 66.0 in | Wt 195.0 lb

## 2016-02-24 DIAGNOSIS — K219 Gastro-esophageal reflux disease without esophagitis: Secondary | ICD-10-CM

## 2016-02-24 DIAGNOSIS — E1121 Type 2 diabetes mellitus with diabetic nephropathy: Secondary | ICD-10-CM

## 2016-02-24 DIAGNOSIS — E785 Hyperlipidemia, unspecified: Secondary | ICD-10-CM | POA: Diagnosis not present

## 2016-02-24 DIAGNOSIS — F329 Major depressive disorder, single episode, unspecified: Secondary | ICD-10-CM

## 2016-02-24 DIAGNOSIS — M5489 Other dorsalgia: Secondary | ICD-10-CM | POA: Diagnosis not present

## 2016-02-24 DIAGNOSIS — Z09 Encounter for follow-up examination after completed treatment for conditions other than malignant neoplasm: Secondary | ICD-10-CM | POA: Diagnosis not present

## 2016-02-24 DIAGNOSIS — G4730 Sleep apnea, unspecified: Secondary | ICD-10-CM

## 2016-02-24 DIAGNOSIS — F411 Generalized anxiety disorder: Secondary | ICD-10-CM

## 2016-02-24 DIAGNOSIS — F32A Depression, unspecified: Secondary | ICD-10-CM

## 2016-02-24 MED ORDER — METHYLPREDNISOLONE ACETATE 80 MG/ML IJ SUSP
80.0000 mg | Freq: Once | INTRAMUSCULAR | Status: AC
  Administered 2016-02-24: 80 mg via INTRAMUSCULAR

## 2016-02-24 MED ORDER — KETOROLAC TROMETHAMINE 60 MG/2ML IM SOLN
60.0000 mg | Freq: Once | INTRAMUSCULAR | Status: AC
  Administered 2016-02-24: 60 mg via INTRAMUSCULAR

## 2016-02-24 NOTE — Assessment & Plan Note (Signed)
Controlled, no change in medication  

## 2016-02-24 NOTE — Progress Notes (Signed)
Cheryl Abbott     MRN: [id number]      DOB: 03-25-1946   HPI Cheryl Abbott is here for follow up and re-evaluation of chronic medical conditions, medication management and review of any available recent lab and radiology data.  Preventive health is updated, specifically  Cancer screening and Immunization.   The PT denies any adverse reactions to current medications since the last visit.  C/o excess weight gain, states she knows she needs to get out of the house more often, and also needs to associate with people more. She has brought in a note  Of concern that "someone" had advised she be hospitalized, I told her that this was not the case C/o excessive sleepiness, has cPAP machine , but "cannot stand" to use it  ROS Denies recent fever or chills. Denies sinus pressure, nasal congestion, ear pain or sore throat. Denies chest congestion, productive cough or wheezing. Denies chest pains, palpitations and leg swelling Denies abdominal pain, nausea, vomiting,diarrhea or constipation.   Denies dysuria, frequency, hesitancy or incontinence. C/o increased back pain  and limitation in mobility. Denies headaches, seizures, numbness, or tingling. Denies uncontrolled  depression, anxiety or insomnia.Has excessive somnolence Denies skin break down or rash.   PE  BP 130/84   Pulse 72   Resp 14   Ht 5\' 6"  (1.676 m)   Wt 195 lb (88.5 kg)   SpO2 98%   BMI 31.47 kg/m   Patient alert and oriented and in no cardiopulmonary distress.  HEENT: No facial asymmetry, EOMI,   oropharynx pink and moist.  Neck supple no JVD, no mass.  Chest: Clear to auscultation bilaterally.  CVS: S1, S2 no murmurs, no S3.Regular rate.  ABD: Soft non tender.   Ext: No edema  MS: Decreased  ROM spine, adequate in shoulders, hips and knees.  Skin: Intact, no ulcerations or rash noted.  Psych: Good eye contact, flat affect. Memory intact not anxious or depressed appearing.  CNS: CN 2-12 intact, power,   normal throughout.no focal deficits noted.   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   The need to take medication as prescribed, test blood sugar as directed, and to call between visits if there is a concern that blood sugar is uncontrolled is also discussed.  Updated lab needed at/ before next visit.  Cheryl Abbott is reminded of the importance of daily foot exam, annual eye examination, and good blood sugar, blood pressure and cholesterol control.  Diabetic Labs Latest Ref Rng & Units 02/18/2016 01/27/2016 11/17/2015 09/13/2015 08/22/2015  HbA1c <5.7 % - - - 6.4(H) -  Microalbumin <2.0 mg/dL - - - - -  Micro/Creat Ratio 0.0 - 30.0 mg/g - - - - -  Chol 125 - 200 mg/dL - - - - -  HDL >=46 mg/dL - - - - -  Calc LDL <130 mg/dL - - - - -  Triglycerides <150 mg/dL - - - - -  Creatinine 0.44 - 1.00 mg/dL 1.45(H) 1.54(H) 1.48(H) 1.41(H) 1.40(H)   BP/Weight 02/24/2016 02/18/2016 02/01/2016 01/27/2016 11/22/2015 11/17/2015 12/06/3660  Systolic BP 947 654 650 354 656 812 751  Diastolic BP 84 86 85 61 90 72 80  Wt. (Lbs) 195 190 193.4 190 188 188 176.4  BMI 31.47 30.21 31.22 30.67 30.36 30.36 28.49   Foot/eye exam completion dates Latest Ref Rng & Units 02/24/2016 12/07/2014  Eye Exam No Retinopathy - -  Foot exam Order - - -  Foot Form Completion - Done Done        Back pain Uncontrolled.Toradol and depo medrol administered IM in the office ,   Generalized anxiety disorder Controlled, no change in medication   Depression Controlled, no change in medication   Sleep apnea Untreated and uncontrolled due to non compliance, re educated re need for same to improve overall health  GERD Controlled, no change in medication   Hyperlipidemia LDL goal  <100 Hyperlipidemia:Low fat diet discussed and encouraged.   Lipid Panel  Lab Results  Component Value Date   CHOL 138 01/19/2015   HDL 46 01/19/2015   LDLCALC 61 01/19/2015   TRIG 153 (H) 01/19/2015   CHOLHDL 3.0 01/19/2015     Updated lab needed at/ before next visit.

## 2016-02-24 NOTE — Patient Instructions (Addendum)
Wellness in 6 weeks, call if you need me sooner  Foot exam and blood pressure are good  Microalb and UA today  You may have toradol 60 mg and depo medrol 80 mg in office for back pain today if you wish  HBA1c, fasting lipid, cmp and eGFR, and vit D in 5.5 weeks  Thank you  for choosing [facility]. We consider it a privelige to serve you.  Delivering excellent health care in a caring and  compassionate way is our goal.  Partnering with you,  so that together we can achieve this goal is our strategy.    No new medication , need to start going to [facility] 3 days per week, and  Get involved in community activiities

## 2016-02-24 NOTE — Assessment & Plan Note (Signed)
Uncontrolled.Toradol and depo medrol administered IM in the office , 

## 2016-02-24 NOTE — Assessment & Plan Note (Signed)
Cheryl Abbott is reminded of the importance of commitment to daily physical activity for 30 minutes or more, as able and the need to limit carbohydrate intake to 30 to 60 grams per meal to help with blood sugar control.   The need to take medication as prescribed, test blood sugar as directed, and to call between visits if there is a concern that blood sugar is uncontrolled is also discussed.  Updated lab needed at/ before next visit.  Cheryl Abbott is reminded of the importance of daily foot exam, annual eye examination, and good blood sugar, blood pressure and cholesterol control.  Diabetic Labs Latest Ref Rng & Units 02/18/2016 01/27/2016 11/17/2015 09/13/2015 08/22/2015  HbA1c <5.7 % - - - 6.4(H) -  Microalbumin <2.0 mg/dL - - - - -  Micro/Creat Ratio 0.0 - 30.0 mg/g - - - - -  Chol 125 - 200 mg/dL - - - - -  HDL >=46 mg/dL - - - - -  Calc LDL <130 mg/dL - - - - -  Triglycerides <150 mg/dL - - - - -  Creatinine 0.44 - 1.00 mg/dL 1.45(H) 1.54(H) 1.48(H) 1.41(H) 1.40(H)   BP/Weight 02/24/2016 02/18/2016 02/01/2016 01/27/2016 11/22/2015 11/17/2015 1/47/8295  Systolic BP 621 308 657 846 962 952 841  Diastolic BP 84 86 85 61 90 72 80  Wt. (Lbs) 195 190 193.4 190 188 188 176.4  BMI 31.47 30.21 31.22 30.67 30.36 30.36 28.49   Foot/eye exam completion dates Latest Ref Rng & Units 02/24/2016 12/07/2014  Eye Exam No Retinopathy - -  Foot exam Order - - -  Foot Form Completion - Done Done

## 2016-02-24 NOTE — Assessment & Plan Note (Signed)
Untreated and uncontrolled due to non compliance, re educated re need for same to improve overall health

## 2016-02-24 NOTE — Assessment & Plan Note (Signed)
Hyperlipidemia:Low fat diet discussed and encouraged.   Lipid Panel  Lab Results  Component Value Date   CHOL 138 01/19/2015   HDL 46 01/19/2015   LDLCALC 61 01/19/2015   TRIG 153 (H) 01/19/2015   CHOLHDL 3.0 01/19/2015     Updated lab needed at/ before next visit.

## 2016-02-25 ENCOUNTER — Observation Stay (HOSPITAL_COMMUNITY)
Admission: EM | Admit: 2016-02-25 | Discharge: 2016-02-27 | Disposition: A | Discharge status: Home / Self Care | Payer: Medicare Other | Attending: Internal Medicine | Admitting: Internal Medicine

## 2016-02-25 ENCOUNTER — Emergency Department (HOSPITAL_COMMUNITY): Payer: Medicare Other

## 2016-02-25 ENCOUNTER — Encounter (HOSPITAL_COMMUNITY): Payer: Self-pay

## 2016-02-25 DIAGNOSIS — E739 Lactose intolerance, unspecified: Secondary | ICD-10-CM | POA: Diagnosis present

## 2016-02-25 DIAGNOSIS — I129 Hypertensive chronic kidney disease with stage 1 through stage 4 chronic kidney disease, or unspecified chronic kidney disease: Secondary | ICD-10-CM | POA: Diagnosis not present

## 2016-02-25 DIAGNOSIS — R29898 Other symptoms and signs involving the musculoskeletal system: Secondary | ICD-10-CM

## 2016-02-25 DIAGNOSIS — E119 Type 2 diabetes mellitus without complications: Secondary | ICD-10-CM | POA: Insufficient documentation

## 2016-02-25 DIAGNOSIS — F32A Depression, unspecified: Secondary | ICD-10-CM | POA: Diagnosis present

## 2016-02-25 DIAGNOSIS — I10 Essential (primary) hypertension: Secondary | ICD-10-CM | POA: Diagnosis present

## 2016-02-25 DIAGNOSIS — R51 Headache: Secondary | ICD-10-CM | POA: Diagnosis present

## 2016-02-25 DIAGNOSIS — M6281 Muscle weakness (generalized): Secondary | ICD-10-CM

## 2016-02-25 DIAGNOSIS — F329 Major depressive disorder, single episode, unspecified: Secondary | ICD-10-CM | POA: Diagnosis present

## 2016-02-25 DIAGNOSIS — G459 Transient cerebral ischemic attack, unspecified: Secondary | ICD-10-CM | POA: Diagnosis not present

## 2016-02-25 DIAGNOSIS — F411 Generalized anxiety disorder: Secondary | ICD-10-CM | POA: Diagnosis present

## 2016-02-25 DIAGNOSIS — N183 Chronic kidney disease, stage 3 unspecified: Secondary | ICD-10-CM | POA: Diagnosis present

## 2016-02-25 DIAGNOSIS — I639 Cerebral infarction, unspecified: Secondary | ICD-10-CM

## 2016-02-25 DIAGNOSIS — F419 Anxiety disorder, unspecified: Secondary | ICD-10-CM | POA: Diagnosis present

## 2016-02-25 DIAGNOSIS — Z87891 Personal history of nicotine dependence: Secondary | ICD-10-CM | POA: Diagnosis not present

## 2016-02-25 DIAGNOSIS — E785 Hyperlipidemia, unspecified: Secondary | ICD-10-CM | POA: Diagnosis present

## 2016-02-25 DIAGNOSIS — R29818 Other symptoms and signs involving the nervous system: Secondary | ICD-10-CM | POA: Diagnosis not present

## 2016-02-25 LAB — CBC
HCT: 34.8 % — ABNORMAL LOW (ref 36.0–46.0)
Hemoglobin: 11.4 g/dL — ABNORMAL LOW (ref 12.0–15.0)
MCH: 30.1 pg (ref 26.0–34.0)
MCHC: 32.8 g/dL (ref 30.0–36.0)
MCV: 91.8 fL (ref 78.0–100.0)
Platelets: 317 10*3/uL (ref 150–400)
RBC: 3.79 MIL/uL — ABNORMAL LOW (ref 3.87–5.11)
RDW: 16.1 % — ABNORMAL HIGH (ref 11.5–15.5)
WBC: 7.8 10*3/uL (ref 4.0–10.5)

## 2016-02-25 LAB — DIFFERENTIAL
Basophils Absolute: 0 10*3/uL (ref 0.0–0.1)
Basophils Relative: 0 %
Eosinophils Absolute: 0.2 10*3/uL (ref 0.0–0.7)
Eosinophils Relative: 2 %
Lymphocytes Relative: 31 %
Lymphs Abs: 2.4 10*3/uL (ref 0.7–4.0)
Monocytes Absolute: 0.7 10*3/uL (ref 0.1–1.0)
Monocytes Relative: 9 %
Neutro Abs: 4.5 10*3/uL (ref 1.7–7.7)
Neutrophils Relative %: 58 %

## 2016-02-25 LAB — COMPREHENSIVE METABOLIC PANEL
ALT: 14 U/L (ref 14–54)
AST: 15 U/L (ref 15–41)
Albumin: 3.9 g/dL (ref 3.5–5.0)
Alkaline Phosphatase: 86 U/L (ref 38–126)
Anion gap: 8 (ref 5–15)
BUN: 21 mg/dL — ABNORMAL HIGH (ref 6–20)
CO2: 25 mmol/L (ref 22–32)
Calcium: 9.6 mg/dL (ref 8.9–10.3)
Chloride: 104 mmol/L (ref 101–111)
Creatinine, Ser: 1.64 mg/dL — ABNORMAL HIGH (ref 0.44–1.00)
GFR calc Af Amer: 35 mL/min — ABNORMAL LOW (ref >60)
GFR calc non Af Amer: 30 mL/min — ABNORMAL LOW (ref >60)
Glucose, Bld: 93 mg/dL (ref 65–99)
Potassium: 4.1 mmol/L (ref 3.5–5.1)
Sodium: 137 mmol/L (ref 135–145)
Total Bilirubin: 0.2 mg/dL — ABNORMAL LOW (ref 0.3–1.2)
Total Protein: 8.5 g/dL — ABNORMAL HIGH (ref 6.5–8.1)

## 2016-02-25 LAB — I-STAT CHEM 8, ED
BUN: 19 mg/dL (ref 6–20)
Calcium, Ion: 1.24 mmol/L (ref 1.15–1.40)
Chloride: 104 mmol/L (ref 101–111)
Creatinine, Ser: 1.6 mg/dL — ABNORMAL HIGH (ref 0.44–1.00)
Glucose, Bld: 91 mg/dL (ref 65–99)
HCT: 37 % (ref 36.0–46.0)
Hemoglobin: 12.6 g/dL (ref 12.0–15.0)
Potassium: 4.1 mmol/L (ref 3.5–5.1)
Sodium: 139 mmol/L (ref 135–145)
TCO2: 24 mmol/L (ref 0–100)

## 2016-02-25 LAB — ETHANOL: Alcohol, Ethyl (B): <5 mg/dL (ref <5)

## 2016-02-25 LAB — PROTIME-INR
INR: 0.97
Prothrombin Time: 12.9 seconds (ref 11.4–15.2)

## 2016-02-25 LAB — APTT: aPTT: 31 seconds (ref 24–36)

## 2016-02-25 NOTE — ED Provider Notes (Signed)
[place] DEPT Provider Note   CSN: [id number] Arrival date & time: 02/25/16  2232  By signing my name below, I, Judithe Modest, attest that this documentation has been prepared under the direction and in the presence of Judge Stall, MD. Electronically Signed: Judithe Modest, ER Scribe. 01/21/2016. 10:51 PM.   History   Chief Complaint Chief Complaint  Patient presents with  . Code Stroke   HPI  HPI Comments: Cheryl Abbott is a 70 y.o. female who presents to the Emergency Department complaining of sudden onset numbness in her left lip with associated HA. She was last seen normal at 9pm. She states she has a PMHx of left sided facial twitching, but has never had numbness.   Past Medical History:  Diagnosis Date  . Anemia   . Anxiety   . Anxiety disorder   . Arthritis   . 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 Chronic back pain   . Depression   . Fatty liver   . GERD (gastroesophageal reflux disease)   . Headache   . History of adenomatous polyp of colon    2008  . History of kidney stones   . Hypercalcemia   . Hyperlipidemia   . Hypertension   . Left ureteral stone   . Lipoma of arm 01/19/2015  . Lumbar disc disease with radiculopathy   . Nephrolithiasis   . Neuropathy, peripheral ([place])   . OSA treated with BiPAP    per study 2007  . Sciatica   . Sigmoid diverticulosis   . Type 2 diabetes mellitus [facility])     Patient Active Problem List   Diagnosis Date Noted  . Lactose intolerance in adult 02/01/2016  . 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 Fall 08/19/2015  . Generalized weakness 08/19/2015  . CKD (chronic kidney disease) stage 3, GFR 30-59 ml/min 07/12/2015  . Constipation 05/19/2015  . Lipoma of arm 01/19/2015  . Back pain   . Allergic cough 12/07/2014  . Anemia 10/14/2014  . Encounter for examination following treatment at hospital 05/11/2014  . Lumbar back pain with radiculopathy affecting left lower extremity 05/11/2014  .  Nocturnal hypoxia 02/04/2014  . Generalized anxiety disorder 05/20/2013  . Dysphagia, idiopathic 03/04/2013  . Depression 08/13/2012  . Abnormal brain scan 02/07/2012  . Thoracic or lumbosacral neuritis or radiculitis, unspecified 12/22/2010  . Hypercalcemia 11/30/2010  . Colon adenomas 05/11/2009  . HEMORRHOIDS 03/15/2009  . GERD 03/15/2009  . FATTY LIVER DISEASE 03/15/2009  . RENAL CALCULUS 03/15/2009  . Sleep apnea 09/15/2008  . Type 2 diabetes with nephropathy ([place]) 02/02/2008  . Hyperlipidemia LDL goal <100 09/17/2007  . Hypertension goal BP (blood pressure) < 130/80 09/17/2007    Past Surgical History:  Procedure Laterality Date  . BIOPSY N/A 03/17/2013   Procedure: GASTRIC BIOPSIES;  Surgeon: Danie Binder, MD;  Location: [facility];  Service: Endoscopy;  Laterality: N/A;  . BIOPSY  06/10/2015   Procedure: BIOPSY;  Surgeon: Danie Binder, MD;  Location[facility];  Service: Endoscopy;;  gastric biopsy  . CARDIAC CATHETERIZATION  05-11-2003  dr Shelva Majestic ([facility])   Abnormal cardiolite/   Normal coronary arteries and normal LVF,  ef  63%  . CARDIOVASCULAR STRESS TEST  01-01-2014   normal lexiscan cardiolite/  no ischemia/ infarct/  normal LV function and wall motion ,  ef 81%  . COLONOSCOPY  last one 10/02/2011   MOD Candor TICS, IH, NEXT TCS APR 2018  . CYSTO/  RIGHT URETEROSOCOPY LASER LITHOTRIPSY STONE EXTRACTION  10-13-2004  . CYSTO/  RIGHT RETROGRADE PYELOGRAM/  PLACMENT RIGHT URETERAL STENT  01-10-2010  . CYSTOSCOPY W/ URETERAL STENT PLACEMENT Right 08/19/2015   Procedure: CYSTOSCOPY WITH RETROGRADE PYELOGRAM/URETERAL STENT PLACEMENT;  Surgeon: Franchot Gallo, MD;  Location: [facility];  Service: Urology;  Laterality: Right;  . CYSTOSCOPY WITH RETROGRADE PYELOGRAM, URETEROSCOPY AND STENT PLACEMENT Left 10/21/2014   Procedure: CYSTOSCOPY WITH RETROGRADE PYELOGRAM, URETEROSCOPY AND STENT PLACEMENT;  Surgeon: Franchot Gallo, MD;  Location: [facility];  Service: Urology;  Laterality: Left;  . CYSTOSCOPY WITH RETROGRADE PYELOGRAM, URETEROSCOPY AND STENT PLACEMENT Right 11/22/2015   Procedure: CYSTOSCOPY, RIGHT URETERAL STENT REMOVAL; RIGHT RETROGRADE PYELOGRAM, RIGHT URETEROSCOPY WITH BALLOON DILATION; RIGHT URETERAL STENT PLACEMENT;  Surgeon: Franchot Gallo, MD;  Location: [facility];  Service: Urology;  Laterality: Right;  . CYSTOSCOPY/RETROGRADE/URETEROSCOPY/STONE EXTRACTION WITH BASKET Bilateral 08/02/2015   Procedure: CYSTOSCOPY; BILATERAL RETROGRADE PYELOGRAMS; BILATERAL URETEROSCOPY, STONE EXTRACTION WITH BASKET;  Surgeon: Franchot Gallo, MD;  Location: [facility];  Service: Urology;  Laterality: Bilateral;  . CYSTOSCOPY/RETROGRADE/URETEROSCOPY/STONE EXTRACTION WITH BASKET Right 06/28/2015   Procedure: CYSTOSCOPY/RETROGRADE/URETEROSCOPY/STONE EXTRACTION WITH BASKET, RIGHT URETERAL DOUBLE J STENT PLACEMENT;  Surgeon: Franchot Gallo, MD;  Location: [facility];  Service: Urology;  Laterality: Right;  . ESOPHAGOGASTRODUODENOSCOPY (EGD) WITH PROPOFOL N/A 03/17/2013   Procedure: ESOPHAGOGASTRODUODENOSCOPY (EGD) WITH PROPOFOL;  Surgeon: Danie Binder, MD;  Location: [facility];  Service: Endoscopy;  Laterality: N/A;  . ESOPHAGOGASTRODUODENOSCOPY (EGD) WITH PROPOFOL N/A 06/10/2015   Procedure: ESOPHAGOGASTRODUODENOSCOPY (EGD) WITH PROPOFOL;  Surgeon: Danie Binder, MD;  [facility];  Service: Endoscopy;  Laterality: N/A;  1245 - pt knows to arrive at 8:30  . FLEXIBLE SIGMOIDOSCOPY  09/11/2011   EXH:[id number] Internal hemorrhoids  . HOLMIUM LASER APPLICATION Left 9/38/1017   Procedure: HOLMIUM LASER APPLICATION;  Surgeon: Franchot Gallo, MD;  [facility];  Service: Urology;  Laterality: Left;  . HOLMIUM LASER APPLICATION Right 10/19/2583   Procedure: HOLMIUM LASER APPLICATION;  Surgeon: Franchot Gallo, MD;  Location: [facility];  Service: Urology;  Laterality: Right;  . MASS EXCISION Left 03/04/2015    Procedure: EXCISION OF SOFT TISSUE NEOPLASM LEFT ARM;  Surgeon: Aviva Signs, MD;  Location: [facility];  Service: General;  Laterality: Left;  . PERCUTANEOUS NEPHROSTOLITHOTOMY Bilateral 12/  2011     [facility]  . POLYPECTOMY N/A 03/17/2013   Procedure: GASTRIC POLYPECTOMY;  Surgeon: Danie Binder, MD;  Location: [facility];  Service: Endoscopy;  Laterality: N/A;  . REMOVAL RIGHT THIGH CYST  2006  . SAVORY DILATION N/A 03/17/2013   Procedure: SAVORY DILATION;  Surgeon: Danie Binder, MD;  Location: [facility];  Service: Endoscopy;  Laterality: N/A;  #12.8, 14, 15, 16 dilators used  . SAVORY DILATION N/A 06/10/2015   Procedure: SAVORY DILATION;  Surgeon: Danie Binder, MD;  [facility];  Service: Endoscopy;  Laterality: N/A;  . TRANSTHORACIC ECHOCARDIOGRAM  01-01-2014   mild LVH/  ef 27-78%/  grade I diastolic dysfunction/  trivial MR, TR, and PR  . VAGINAL HYSTERECTOMY  1970's    OB History    Gravida Para Term Preterm AB Living   2 2 2     2    SAB TAB Ectopic Multiple Live Births                   Home Medications    Prior to Admission medications   Medication Sig Start Date End Date Taking? Authorizing Provider  ALPRAZolam Duanne Moron)  0.5 MG tablet Take 1 tablet (0.5 mg total) by mouth 2 (two) times daily. 02/21/16   [name], MD  aspirin EC 81 MG tablet Take 81 mg by mouth daily.    Historical Provider, MD  clotrimazole-betamethasone (LOTRISONE) cream Apply 1 application topically 2 (two) times daily as needed (for rash).    Historical Provider, MD  docusate sodium (COLACE) 100 MG capsule Take 200 mg by mouth at bedtime.     Historical Provider, MD  lovastatin (MEVACOR) 40 MG tablet Take 80 mg by mouth at bedtime.    Historical Provider, MD  ondansetron (ZOFRAN) 4 MG tablet Take 1 tablet (4 mg total) by mouth every 8 (eight) hours as needed for nausea or vomiting. 08/26/15   [name], MD  pantoprazole (PROTONIX) 40 MG tablet Take 1 tablet (40 mg total) by mouth 2  (two) times daily. 08/26/15   [name], MD  PARoxetine (PAXIL) 40 MG tablet Take 40 mg by mouth daily.    Historical Provider, MD  Vitamin D, Ergocalciferol, (DRISDOL) 50000 units CAPS capsule Take 50,000 Units by mouth every 7 (seven) days.    Historical Provider, MD    Family History Family History  Problem Relation Age of Onset  . Hypertension Mother   . Diabetes Mother   . Heart failure Mother   . Dementia Mother   . Emphysema Father   . Hypertension Father   . Diabetes Brother   . GER disease Brother   . Hypertension Sister   . Hypertension Sister   . Cancer      family history   . Diabetes      family history   . Heart defect      famiily history   . Arthritis      family history   . Anesthesia problems Neg Hx   . Hypotension Neg Hx   . Malignant hyperthermia Neg Hx   . Pseudochol deficiency Neg Hx   . Colon cancer Neg Hx     Social History Social History  Substance Use Topics  . Smoking status: Former Smoker    Packs/day: 1.00    Years: 20.00    Types: Cigarettes    Quit date: 09/21/1994  . Smokeless tobacco: Never Used     Comment: Quit x 20 years  . Alcohol use No     Allergies   Ace inhibitors; Keflex [cephalexin]; Nitrofurantoin; and Penicillins   Review of Systems Review of Systems  Constitutional: Negative for chills and fever.  Neurological: Positive for facial asymmetry, numbness and headaches. Negative for weakness.  All other systems reviewed and are negative.    Physical Exam Updated Vital Signs BP 143/86   Pulse 78   Temp 98.8 F (37.1 C) (Oral)   Resp 22   Ht 5\' 6"  (1.676 m)   Wt 195 lb (88.5 kg)   SpO2 92%   BMI 31.47 kg/m   Physical Exam  Constitutional: She is oriented to person, place, and time. She appears well-developed and well-nourished. No distress.  HENT:  Head: Normocephalic and atraumatic.  Eyes: Pupils are equal, round, and reactive to light.  Neck: Neck supple.  Cardiovascular: Normal rate.     Pulmonary/Chest: Effort normal. No respiratory distress.  Musculoskeletal: Normal range of motion.  Neurological: She is alert and oriented to person, place, and time. Coordination normal.  Left sided subtle facial droop with mildly decreased sensation. Normal strength in all 4 extremities, normal speech, cranial nerves III through XII  without deficit other than slight decreased sensation on the left side of the face  Skin: Skin is warm and dry. She is not diaphoretic.  Psychiatric: She has a normal mood and affect. Her behavior is normal.  Nursing note and vitals reviewed.    ED Treatments / Results  DIAGNOSTIC STUDIES: Oxygen Saturation is 99% on RA, normal by my interpretation.    COORDINATION OF CARE: 12:20 AM Discussed treatment plan with pt at bedside which includes CT and labs and pt agreed to plan.  Labs (all labs ordered are listed, but only abnormal results are displayed) Labs Reviewed  CBC - Abnormal; Notable for the following:       Result Value   RBC 3.79 (*)    Hemoglobin 11.4 (*)    HCT 34.8 (*)    RDW 16.1 (*)    All other components within normal limits  COMPREHENSIVE METABOLIC PANEL - Abnormal; Notable for the following:    BUN 21 (*)    Creatinine, Ser 1.64 (*)    Total Protein 8.5 (*)    Total Bilirubin 0.2 (*)    GFR calc non Af Amer 30 (*)    GFR calc Af Amer 35 (*)    All other components within normal limits  I-STAT CHEM 8, ED - Abnormal; Notable for the following:    Creatinine, Ser 1.60 (*)    All other components within normal limits  ETHANOL  PROTIME-INR  APTT  DIFFERENTIAL  URINE RAPID DRUG SCREEN, HOSP PERFORMED  URINALYSIS, ROUTINE W REFLEX MICROSCOPIC (NOT AT [facility])  I-STAT TROPOININ, ED    EKG  EKG Interpretation  Date/Time:  Saturday February 25 2016 22:51:15 EDT Ventricular Rate:  85 PR Interval:    QRS Duration: 81 QT Interval:  355 QTC Calculation: 423 R Axis:   13 Text Interpretation:  Sinus rhythm Low voltage,  precordial leads Normal ECG since last tracing no significant change Confirmed by Sabra Heck  MD, Ronan Duecker ([id number]) on 02/26/2016 12:05:34 AM       Radiology Ct Head Code Stroke W/o Cm  Result Date: 02/25/2016 CLINICAL DATA:  Code stroke.  Left-sided facial numbness. EXAM: CT HEAD WITHOUT CONTRAST TECHNIQUE: Contiguous axial images were obtained from the base of the skull through the vertex without intravenous contrast. COMPARISON:  08/18/2015 CT head. FINDINGS: No evidence of large acute infarct, focal mass effect, intracranial hemorrhage, hydrocephalus, or extra-axial collection. Stable lucency within the right anterior lentiform nucleus extending into right frontal periventricular white matter probably representing an old infarct. Mild parenchymal volume loss and chronic microvascular ischemic changes. No hyperdense vessel identified. Mild calcific atherosclerosis of cavernous internal carotid arteries. Visualized paranasal sinuses, mastoid air cells, and orbits are unremarkable. No displaced calvarial fracture or suspicious osseous lesion. Nonspecific dural calcifications along the anterior medial aspects of left middle cranial fossa. ASPECTS [facility] Stroke Program Early CT Score) - Ganglionic level infarction (caudate, lentiform nuclei, internal capsule, insula, M1-M3 cortex): 7 - Supraganglionic infarction (M4-M6 cortex): 3 Total score (0-10 with 10 being normal): 10 IMPRESSION: 1. No acute intracranial abnormality is identified. If symptoms persist or if clinically indicated MRI is more sensitive for acute stroke. 2. ASPECTS is 10 3. 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 These results were called by telephone at the time of interpretation on 02/25/2016 at 11:29 pm to Dr. Noemi Chapel , who verbally acknowledged these results. Electronically Signed   By: Kristine Garbe  M.D.   On: 02/25/2016 23:30    Procedures Procedures (including critical care time)  Medications Ordered in ED Medications  aspirin chewable tablet 324 mg (324 mg Oral Given 02/26/16 0019)     Initial Impression / Assessment and Plan / ED Course  I have reviewed the triage vital signs and the nursing notes.  Pertinent labs & imaging results that were available during my care of the patient were reviewed by me and considered in my medical decision making (see chart for details).  Clinical Course    Final Clinical Impressions(s) / ED Diagnoses   Final diagnoses:  Acute ischemic stroke [facility])    New Prescriptions New Prescriptions   No medications on file    Radiology read the CT scan at 11:30 PM, telemetry neurology return call at 1203 after seeing the patient and recommended against thrombolytic therapy secondary to the patient's mild symptoms. They did recommend admission to the hospital for further stroke workup  D/w Dr. Marin Comment =- will admit   I personally performed the services described in this documentation, which was scribed in my presence. The recorded information has been reviewed and is accurate.      Noemi Chapel, MD 02/26/16 [phone]

## 2016-02-25 NOTE — ED Notes (Signed)
Pt states during [facility] exam that the left arm & leg are now numb that was not present upon first neuro exam.

## 2016-02-25 NOTE — ED Notes (Signed)
Pt states she was taking off her BP meds back in June.

## 2016-02-25 NOTE — ED Notes (Signed)
Equipment tested for [facility]

## 2016-02-25 NOTE — ED Triage Notes (Signed)
Left sided facial numbness with droop started at 2130 today. Also c/o headache. Patient ambulatory with shuffled gait into triage

## 2016-02-26 ENCOUNTER — Observation Stay (HOSPITAL_COMMUNITY): Payer: Medicare Other

## 2016-02-26 ENCOUNTER — Encounter (HOSPITAL_COMMUNITY): Payer: Self-pay | Admitting: Internal Medicine

## 2016-02-26 DIAGNOSIS — I6523 Occlusion and stenosis of bilateral carotid arteries: Secondary | ICD-10-CM | POA: Diagnosis not present

## 2016-02-26 DIAGNOSIS — G459 Transient cerebral ischemic attack, unspecified: Secondary | ICD-10-CM

## 2016-02-26 DIAGNOSIS — N183 Chronic kidney disease, stage 3 (moderate): Secondary | ICD-10-CM | POA: Diagnosis not present

## 2016-02-26 DIAGNOSIS — E785 Hyperlipidemia, unspecified: Secondary | ICD-10-CM

## 2016-02-26 DIAGNOSIS — F411 Generalized anxiety disorder: Secondary | ICD-10-CM

## 2016-02-26 DIAGNOSIS — R51 Headache: Secondary | ICD-10-CM | POA: Diagnosis not present

## 2016-02-26 DIAGNOSIS — F329 Major depressive disorder, single episode, unspecified: Secondary | ICD-10-CM | POA: Diagnosis not present

## 2016-02-26 DIAGNOSIS — R200 Anesthesia of skin: Secondary | ICD-10-CM | POA: Diagnosis not present

## 2016-02-26 DIAGNOSIS — Z8673 Personal history of transient ischemic attack (TIA), and cerebral infarction without residual deficits: Secondary | ICD-10-CM

## 2016-02-26 HISTORY — DX: Personal history of transient ischemic attack (TIA), and cerebral infarction without residual deficits: Z86.73

## 2016-02-26 HISTORY — DX: Transient cerebral ischemic attack, unspecified: G45.9

## 2016-02-26 LAB — GLUCOSE, CAPILLARY
Glucose-Capillary: 90 mg/dL (ref 65–99)
Glucose-Capillary: 100 mg/dL — ABNORMAL HIGH (ref 65–99)
Glucose-Capillary: 88 mg/dL (ref 65–99)
Glucose-Capillary: 116 mg/dL — ABNORMAL HIGH (ref 65–99)

## 2016-02-26 LAB — COMPREHENSIVE METABOLIC PANEL
ALT: 12 U/L — ABNORMAL LOW (ref 14–54)
AST: 13 U/L — ABNORMAL LOW (ref 15–41)
Albumin: 3.5 g/dL (ref 3.5–5.0)
Alkaline Phosphatase: 76 U/L (ref 38–126)
Anion gap: 9 (ref 5–15)
BUN: 17 mg/dL (ref 6–20)
CO2: 24 mmol/L (ref 22–32)
Calcium: 9.2 mg/dL (ref 8.9–10.3)
Chloride: 103 mmol/L (ref 101–111)
Creatinine, Ser: 1.52 mg/dL — ABNORMAL HIGH (ref 0.44–1.00)
GFR calc Af Amer: 39 mL/min — ABNORMAL LOW (ref >60)
GFR calc non Af Amer: 33 mL/min — ABNORMAL LOW (ref >60)
Glucose, Bld: 90 mg/dL (ref 65–99)
Potassium: 3.9 mmol/L (ref 3.5–5.1)
Sodium: 136 mmol/L (ref 135–145)
Total Bilirubin: 0.3 mg/dL (ref 0.3–1.2)
Total Protein: 7.6 g/dL (ref 6.5–8.1)

## 2016-02-26 LAB — CBC WITH DIFFERENTIAL/PLATELET
Basophils Absolute: 0 10*3/uL (ref 0.0–0.1)
Basophils Relative: 0 %
Eosinophils Absolute: 0.2 10*3/uL (ref 0.0–0.7)
Eosinophils Relative: 3 %
HCT: 33.5 % — ABNORMAL LOW (ref 36.0–46.0)
Hemoglobin: 11 g/dL — ABNORMAL LOW (ref 12.0–15.0)
Lymphocytes Relative: 29 %
Lymphs Abs: 2.1 10*3/uL (ref 0.7–4.0)
MCH: 30.3 pg (ref 26.0–34.0)
MCHC: 32.8 g/dL (ref 30.0–36.0)
MCV: 92.3 fL (ref 78.0–100.0)
Monocytes Absolute: 0.7 10*3/uL (ref 0.1–1.0)
Monocytes Relative: 10 %
Neutro Abs: 4.2 10*3/uL (ref 1.7–7.7)
Neutrophils Relative %: 58 %
Platelets: 314 10*3/uL (ref 150–400)
RBC: 3.63 MIL/uL — ABNORMAL LOW (ref 3.87–5.11)
RDW: 16.2 % — ABNORMAL HIGH (ref 11.5–15.5)
WBC: 7.2 10*3/uL (ref 4.0–10.5)

## 2016-02-26 LAB — LIPID PANEL
Cholesterol: 175 mg/dL (ref 0–200)
HDL: 40 mg/dL — ABNORMAL LOW (ref >40)
LDL Cholesterol: 105 mg/dL — ABNORMAL HIGH (ref 0–99)
Total CHOL/HDL Ratio: 4.4 RATIO
Triglycerides: 148 mg/dL (ref <150)
VLDL: 30 mg/dL (ref 0–40)

## 2016-02-26 MED ORDER — ALPRAZOLAM 0.5 MG PO TABS
0.5000 mg | ORAL_TABLET | Freq: Two times a day (BID) | ORAL | Status: DC
  Administered 2016-02-26 – 2016-02-27 (×3): 0.5 mg via ORAL
  Filled 2016-02-26 (×3): qty 1

## 2016-02-26 MED ORDER — STROKE: EARLY STAGES OF RECOVERY BOOK
Freq: Once | Status: DC
  Filled 2016-02-26: qty 1

## 2016-02-26 MED ORDER — HEPARIN SODIUM (PORCINE) 5000 UNIT/ML IJ SOLN
5000.0000 [IU] | Freq: Three times a day (TID) | INTRAMUSCULAR | Status: DC
  Administered 2016-02-26 – 2016-02-27 (×3): 5000 [IU] via SUBCUTANEOUS
  Filled 2016-02-26 (×3): qty 1

## 2016-02-26 MED ORDER — CLOPIDOGREL BISULFATE 75 MG PO TABS
75.0000 mg | ORAL_TABLET | Freq: Every day | ORAL | Status: DC
  Administered 2016-02-26 – 2016-02-27 (×2): 75 mg via ORAL
  Filled 2016-02-26 (×2): qty 1

## 2016-02-26 MED ORDER — PAROXETINE HCL 20 MG PO TABS
40.0000 mg | ORAL_TABLET | Freq: Every day | ORAL | Status: DC
  Administered 2016-02-26 – 2016-02-27 (×2): 40 mg via ORAL
  Filled 2016-02-26 (×2): qty 2

## 2016-02-26 MED ORDER — DOCUSATE SODIUM 100 MG PO CAPS
200.0000 mg | ORAL_CAPSULE | Freq: Every day | ORAL | Status: DC
  Administered 2016-02-26: 200 mg via ORAL
  Filled 2016-02-26: qty 2

## 2016-02-26 MED ORDER — SENNOSIDES-DOCUSATE SODIUM 8.6-50 MG PO TABS: 1.0000 | ORAL_TABLET | Freq: Every evening | ORAL | Status: DC | PRN

## 2016-02-26 MED ORDER — ACETAMINOPHEN 325 MG PO TABS
650.0000 mg | ORAL_TABLET | Freq: Four times a day (QID) | ORAL | Status: DC | PRN
  Administered 2016-02-26 – 2016-02-27 (×3): 650 mg via ORAL
  Filled 2016-02-26 (×3): qty 2

## 2016-02-26 MED ORDER — ASPIRIN EC 81 MG PO TBEC
81.0000 mg | DELAYED_RELEASE_TABLET | Freq: Every day | ORAL | Status: DC
  Administered 2016-02-26 – 2016-02-27 (×2): 81 mg via ORAL
  Filled 2016-02-26 (×2): qty 1

## 2016-02-26 MED ORDER — SODIUM CHLORIDE 0.9 % IV SOLN
INTRAVENOUS | Status: DC
Start: 2016-02-26 — End: 2016-02-26
  Administered 2016-02-26: 75 mL via INTRAVENOUS

## 2016-02-26 MED ORDER — PANTOPRAZOLE SODIUM 40 MG PO TBEC
40.0000 mg | DELAYED_RELEASE_TABLET | Freq: Two times a day (BID) | ORAL | Status: DC
  Administered 2016-02-26 – 2016-02-27 (×4): 40 mg via ORAL
  Filled 2016-02-26 (×4): qty 1

## 2016-02-26 MED ORDER — ASPIRIN 81 MG PO CHEW
324.0000 mg | CHEWABLE_TABLET | Freq: Once | ORAL | Status: AC
  Administered 2016-02-26: 324 mg via ORAL
  Filled 2016-02-26: qty 4

## 2016-02-26 MED ORDER — PRAVASTATIN SODIUM 40 MG PO TABS
40.0000 mg | ORAL_TABLET | Freq: Every day | ORAL | Status: DC
  Administered 2016-02-26 – 2016-02-27 (×2): 40 mg via ORAL
  Filled 2016-02-26 (×2): qty 1

## 2016-02-26 MED ORDER — INSULIN ASPART 100 UNIT/ML ~~LOC~~ SOLN: 0.0000 [IU] | Freq: Every day | SUBCUTANEOUS | Status: DC

## 2016-02-26 MED ORDER — INSULIN ASPART 100 UNIT/ML ~~LOC~~ SOLN: 0.0000 [IU] | Freq: Three times a day (TID) | SUBCUTANEOUS | Status: DC

## 2016-02-26 NOTE — H&P (Signed)
History and Physical    RIM THATCH CVE:[id number] DOB: 02-03-1946 DOA: 02/25/2016  PCP: Tula Nakayama, MD   Patient coming from: Home  Chief Complaint: Left-sided lip numbness and headache.  HPI: Cheryl Abbott is a 70 y.o. female with medical history significant of type 2 DM,  HLD, GERD, depression, GAD, and CDK stage 3 presents to the ED with left-sided lip numbness and headache that onset around 9pm tonight. She states that the numbness has not resided. She reports prior left-sided lip numbness. However, she did not go the ED at that time. Patient also reports drooling and "different feelings" in her left arm and left leg. She denies facial weakness and trouble speaking. Patient states that she lives alone and does not smoke or drink alcohol.  She came as a code stroke.  She was not a candidate for TPA, as her symptoms were mild.  Hospitalist was asked to admit her for TIA/CVA work up. She has been on ASA faithfully. In the past, she was on Metformin for her DM, and Norvasc for her HTN, and they were both taken off by her PCP.  She doesn't smoke and doesn't drink.    ED Course: While in the ED, her lab results showed creatinine 1.60. EKG revealed sinus rhythm. Head CT was unremarkable. The patient was admitted for further evaluation of possible TIA /CVA.  Review of Systems: As per HPI otherwise 10 point review of systems negative.    Past Medical History:  Diagnosis Date  . Anemia   . Anxiety   . Anxiety disorder   . Arthritis   . 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 Chronic back pain   . Depression   . Fatty liver   . GERD (gastroesophageal reflux disease)   . Headache   . History of adenomatous polyp of colon    2008  . History of kidney stones   . Hypercalcemia   . Hyperlipidemia   . Hypertension   . Left ureteral stone   . Lipoma of arm 01/19/2015  . Lumbar disc disease with radiculopathy   . Nephrolithiasis   . Neuropathy, peripheral ([place])   . OSA  treated with BiPAP    per study 2007  . Sciatica   . Sigmoid diverticulosis   . Type 2 diabetes mellitus ([place])     Past Surgical History:  Procedure Laterality Date  . BIOPSY N/A 03/17/2013   Procedure: GASTRIC BIOPSIES;  Surgeon: Danie Binder, MD;  Location: [facility];  Service: Endoscopy;  Laterality: N/A;  . BIOPSY  06/10/2015   Procedure: BIOPSY;  Surgeon: Danie Binder, MD;  [facility];  Service: Endoscopy;;  gastric biopsy  . CARDIAC CATHETERIZATION  05-11-2003  dr Shelva Majestic ([facility])   Abnormal cardiolite/   Normal coronary arteries and normal LVF,  ef  63%  . CARDIOVASCULAR STRESS TEST  01-01-2014   normal lexiscan cardiolite/  no ischemia/ infarct/  normal LV function and wall motion ,  ef 81%  . COLONOSCOPY  last one 10/02/2011   MOD [place] TICS, IH, NEXT TCS APR 2018  . CYSTO/   RIGHT URETEROSOCOPY LASER LITHOTRIPSY STONE EXTRACTION  10-13-2004  . CYSTO/  RIGHT RETROGRADE PYELOGRAM/  PLACMENT RIGHT URETERAL STENT  01-10-2010  . CYSTOSCOPY W/ URETERAL STENT PLACEMENT Right 08/19/2015   Procedure: CYSTOSCOPY WITH RETROGRADE PYELOGRAM/URETERAL STENT PLACEMENT;  Surgeon: Franchot Gallo, MD;  Location: [facility];  Service: Urology;  Laterality: Right;  . CYSTOSCOPY WITH RETROGRADE PYELOGRAM, URETEROSCOPY  AND STENT PLACEMENT Left 10/21/2014   Procedure: CYSTOSCOPY WITH RETROGRADE PYELOGRAM, URETEROSCOPY AND STENT PLACEMENT;  Surgeon: Franchot Gallo, MD;  [facility];  Service: Urology;  Laterality: Left;  . CYSTOSCOPY WITH RETROGRADE PYELOGRAM, URETEROSCOPY AND STENT PLACEMENT Right 11/22/2015   Procedure: CYSTOSCOPY, RIGHT URETERAL STENT REMOVAL; RIGHT RETROGRADE PYELOGRAM, RIGHT URETEROSCOPY WITH BALLOON DILATION; RIGHT URETERAL STENT PLACEMENT;  Surgeon: Franchot Gallo, MD;  Location: [facility];  Service: Urology;  Laterality: Right;  . CYSTOSCOPY/RETROGRADE/URETEROSCOPY/STONE EXTRACTION WITH BASKET Bilateral 08/02/2015    Procedure: CYSTOSCOPY; BILATERAL RETROGRADE PYELOGRAMS; BILATERAL URETEROSCOPY, STONE EXTRACTION WITH BASKET;  Surgeon: Franchot Gallo, MD;  Location: [facility];  Service: Urology;  Laterality: Bilateral;  . CYSTOSCOPY/RETROGRADE/URETEROSCOPY/STONE EXTRACTION WITH BASKET Right 06/28/2015   Procedure: CYSTOSCOPY/RETROGRADE/URETEROSCOPY/STONE EXTRACTION WITH BASKET, RIGHT URETERAL DOUBLE J STENT PLACEMENT;  Surgeon: Franchot Gallo, MD;  Location: [facility];  Service: Urology;  Laterality: Right;  . ESOPHAGOGASTRODUODENOSCOPY (EGD) WITH PROPOFOL N/A 03/17/2013   Procedure: ESOPHAGOGASTRODUODENOSCOPY (EGD) WITH PROPOFOL;  Surgeon: Danie Binder, MD;  [facility];  Service: Endoscopy;  Laterality: N/A;  . ESOPHAGOGASTRODUODENOSCOPY (EGD) WITH PROPOFOL N/A 06/10/2015   Procedure: ESOPHAGOGASTRODUODENOSCOPY (EGD) WITH PROPOFOL;  Surgeon: Danie Binder, MD;  [facility];  Service: Endoscopy;  Laterality: N/A;  1245 - pt knows to arrive at 8:30  . FLEXIBLE SIGMOIDOSCOPY  09/11/2011   FKC:LEXNTZGY Internal hemorrhoids  . HOLMIUM LASER APPLICATION Left 1/74/9449   Procedure: HOLMIUM LASER APPLICATION;  Surgeon: Franchot Gallo, MD;  Location[facility];  Service: Urology;  Laterality: Left;  . HOLMIUM LASER APPLICATION Right 6/75/9163   Procedure: HOLMIUM LASER APPLICATION;  Surgeon: Franchot Gallo, MD;  Location: [facility];  Service: Urology;  Laterality: Right;  . MASS EXCISION Left 03/04/2015   Procedure: EXCISION OF SOFT TISSUE NEOPLASM LEFT ARM;  Surgeon: Aviva Signs, MD;  Location: [facility];  Service: General;  Laterality: Left;  . PERCUTANEOUS NEPHROSTOLITHOTOMY Bilateral 12/  2011     [facility]  . POLYPECTOMY N/A 03/17/2013   Procedure: GASTRIC POLYPECTOMY;  Surgeon: Danie Binder, MD;  Location: [facility];  Service: Endoscopy;  Laterality: N/A;  . REMOVAL RIGHT THIGH CYST  2006  . SAVORY DILATION N/A 03/17/2013   Procedure: SAVORY DILATION;  Surgeon: Danie Binder, MD;   Location: [facility];  Service: Endoscopy;  Laterality: N/A;  #12.8, 14, 15, 16 dilators used  . SAVORY DILATION N/A 06/10/2015   Procedure: SAVORY DILATION;  Surgeon: Danie Binder, MD;  [facility];  Service: Endoscopy;  Laterality: N/A;  . TRANSTHORACIC ECHOCARDIOGRAM  01-01-2014   mild LVH/  ef 84-66%/  grade I diastolic dysfunction/  trivial MR, TR, and PR  . VAGINAL HYSTERECTOMY  1970's     reports that she quit smoking about 21 years ago. Her smoking use included Cigarettes. She has a 20.00 pack-year smoking history. She has never used smokeless tobacco. She reports that she does not drink alcohol or use drugs.  Allergies  Allergen Reactions  . Ace Inhibitors Cough  . Keflex [Cephalexin] Diarrhea and Nausea And Vomiting  . Nitrofurantoin Diarrhea and Nausea And Vomiting  . Penicillins Hives and Other (See Comments)    Reaction:  Blisters on hands/feet  Has patient had a PCN reaction causing immediate rash, facial/tongue/throat swelling, SOB or lightheadedness with hypotension: Yes Has patient had a PCN reaction causing severe rash involving mucus membranes or skin necrosis: No Has patient had a PCN reaction that required hospitalization No Has patient had a PCN reaction  occurring within the last 10 years: No If all of the above answers are "NO", then may proceed with Cephalosporin use.    Family History  Problem Relation Age of Onset  . Hypertension Mother   . Diabetes Mother   . Heart failure Mother   . Dementia Mother   . Emphysema Father   . Hypertension Father   . Diabetes Brother   . GER disease Brother   . Hypertension Sister   . Hypertension Sister   . Cancer      family history   . Diabetes      family history   . Heart defect      famiily history   . Arthritis      family history   . Anesthesia problems Neg Hx   . Hypotension Neg Hx   . Malignant hyperthermia Neg Hx   . Pseudochol deficiency Neg Hx   . Colon cancer Neg Hx     Prior to  Admission medications   Medication Sig Start Date End Date Taking? Authorizing Provider  ALPRAZolam Duanne Moron) 0.5 MG tablet Take 1 tablet (0.5 mg total) by mouth 2 (two) times daily. 02/21/16   [name], MD  aspirin EC 81 MG tablet Take 81 mg by mouth daily.    Historical Provider, MD  clotrimazole-betamethasone (LOTRISONE) cream Apply 1 application topically 2 (two) times daily as needed (for rash).    Historical Provider, MD  docusate sodium (COLACE) 100 MG capsule Take 200 mg by mouth at bedtime.     Historical Provider, MD  lovastatin (MEVACOR) 40 MG tablet Take 80 mg by mouth at bedtime.    Historical Provider, MD  ondansetron (ZOFRAN) 4 MG tablet Take 1 tablet (4 mg total) by mouth every 8 (eight) hours as needed for nausea or vomiting. 08/26/15   [name], MD  pantoprazole (PROTONIX) 40 MG tablet Take 1 tablet (40 mg total) by mouth 2 (two) times daily. 08/26/15   [name], MD  PARoxetine (PAXIL) 40 MG tablet Take 40 mg by mouth daily.    Historical Provider, MD  Vitamin D, Ergocalciferol, (DRISDOL) 50000 units CAPS capsule Take 50,000 Units by mouth every 7 (seven) days.    Historical Provider, MD    Physical Exam: Vitals:   02/25/16 2252 02/25/16 2300 02/25/16 2315 02/25/16 2330  BP: 149/97 (!) 129/114 (!) 142/125 143/86  Pulse:  79 78 78  Resp: 15 17 19 22   Temp:      TempSrc:      SpO2:  100% 91% 92%  Weight:      Height:          Constitutional: NAD, calm, comfortable Vitals:   02/25/16 2252 02/25/16 2300 02/25/16 2315 02/25/16 2330  BP: 149/97 (!) 129/114 (!) 142/125 143/86  Pulse:  79 78 78  Resp: 15 17 19 22   Temp:      TempSrc:      SpO2:  100% 91% 92%  Weight:      Height:       Eyes: PERRL, lids and conjunctivae normal ENMT: Mucous membranes are moist. Posterior pharynx clear of any exudate or lesions.Normal dentition.  Neck: normal, supple, no masses, no thyromegaly Respiratory: clear to auscultation bilaterally, no wheezing, no  crackles. Normal respiratory effort. No accessory muscle use.  Cardiovascular: Regular rate and rhythm, no murmurs / rubs / gallops. No extremity edema. 2+ pedal pulses. No carotid bruits.  Abdomen: no tenderness, no masses palpated. No hepatosplenomegaly. Bowel sounds  positive.  Musculoskeletal: no clubbing / cyanosis. No joint deformity upper and lower extremities. Good ROM, no contractures. Normal muscle tone.  Skin: no rashes, lesions, ulcers. No induration Neurologic: CN 2-12 grossly intact. Sensation intact, DTR normal. Strength 5/5 in all 4.  Psychiatric: Normal judgment and insight. Alert and oriented x 3. Normal mood.    Labs on Admission: I have personally reviewed following labs and imaging studies  CBC:  Recent Labs Lab 02/25/16 2254 02/25/16 2301  WBC 7.8  --   NEUTROABS 4.5  --   HGB 11.4* 12.6  HCT 34.8* 37.0  MCV 91.8  --   PLT 317  --    Basic Metabolic Panel:  Recent Labs Lab 02/25/16 2254 02/25/16 2301  NA 137 139  K 4.1 4.1  CL 104 104  CO2 25  --   GLUCOSE 93 91  BUN 21* 19  CREATININE 1.64* 1.60*  CALCIUM 9.6  --    GFR: Estimated Creatinine Clearance: 36.1 mL/min (by C-G formula based on SCr of 1.6 mg/dL (H)). Liver Function Tests:  Recent Labs Lab 02/25/16 2254  AST 15  ALT 14  ALKPHOS 86  BILITOT 0.2*  PROT 8.5*  ALBUMIN 3.9   Coagulation Profile:  Recent Labs Lab 02/25/16 2254  INR 0.97   Urine analysis:    Component Value Date/Time   COLORURINE STRAW (A) 08/18/2015 1800   APPEARANCEUR CLEAR 08/18/2015 1800   LABSPEC <1.005 (L) 08/18/2015 1800   PHURINE 5.5 08/18/2015 1800   GLUCOSEU NEGATIVE 08/18/2015 1800   HGBUR SMALL (A) 08/18/2015 1800   HGBUR moderate 02/07/2010 0907   BILIRUBINUR neg 09/06/2015 1401   KETONESUR NEGATIVE 08/18/2015 1800   PROTEINUR 100 09/06/2015 1401   PROTEINUR TRACE (A) 08/18/2015 1800   UROBILINOGEN 0.2 09/06/2015 1401   UROBILINOGEN 0.2 03/01/2015 1600   NITRITE neg 09/06/2015 1401    NITRITE NEGATIVE 08/18/2015 1800   LEUKOCYTESUR large (3+) (A) 09/06/2015 1401   Radiological Exams on Admission: Ct Head Code Stroke W/o Cm  Result Date: 02/25/2016 CLINICAL DATA:  Code stroke.  Left-sided facial numbness. EXAM: CT HEAD WITHOUT CONTRAST TECHNIQUE: Contiguous axial images were obtained from the base of the skull through the vertex without intravenous contrast. COMPARISON:  08/18/2015 CT head. FINDINGS: No evidence of large acute infarct, focal mass effect, intracranial hemorrhage, hydrocephalus, or extra-axial collection. Stable lucency within the right anterior lentiform nucleus extending into right frontal periventricular white matter probably representing an old infarct. Mild parenchymal volume loss and chronic microvascular ischemic changes. No hyperdense vessel identified. Mild calcific atherosclerosis of cavernous internal carotid arteries. Visualized paranasal sinuses, mastoid air cells, and orbits are unremarkable. No displaced calvarial fracture or suspicious osseous lesion. Nonspecific dural calcifications along the anterior medial aspects of left middle cranial fossa. ASPECTS [facility] Stroke Program Early CT Score) - Ganglionic level infarction (caudate, lentiform nuclei, internal capsule, insula, M1-M3 cortex): 7 - Supraganglionic infarction (M4-M6 cortex): 3 Total score (0-10 with 10 being normal): 10 IMPRESSION: 1. No acute intracranial abnormality is identified. If symptoms persist or if clinically indicated MRI is more sensitive for acute stroke. 2. ASPECTS is 10 3. 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 These results were called by telephone at the time of interpretation on 02/25/2016 at 11:29 pm to Dr. Noemi Chapel , who verbally acknowledged these results. Electronically Signed   By: Kristine Garbe M.D.   On: 02/25/2016 23:30  EKG: Independently reviewed. Sinus rhythm.  Assessment/Plan Principal Problem:   Brain TIA Active Problems:   Hyperlipidemia LDL goal <100   Hypertension goal BP (blood pressure) < 130/80   Depression   Generalized anxiety disorder   CKD (chronic kidney disease) stage 3, GFR 30-59 ml/min   Lactose intolerance in adult   TIA (transient ischemic attack)  1. Suspicious for TIA or CVA of right hemisphere. CT unremarkable. Will admit her for full strokel work-up. Order MRI of brain. Order doppler of carotid and echo. SInce she has been on aspirin and administer DUAT.  She should be on ASA and Plavix for at least 6 months.  2. Type 2 DM. Glucose levels stable. Patient reports previously taking metformin. Controlled. Sensitive scale. 3. HLD. Continue statin. 4. HTN. Slightly elevated. Allow permissive HTN. Continue to monitor. 5. CKD stage 3. Creatinine elevated. Continue to monitor. 6. GERD. Noted. 7. Depression. Noted. 8. GAD. Noted. 9. Lactose intolerance. Noted.   DVT prophylaxis: SubQ Code Status: Full Family Communication: No family bedside Disposition Plan: Discharge once improved Consults called: None Admission status: Inpatient   Orvan Falconer, MD FACP [facility] If 7PM-7AM, please contact night-coverage [email or web address] Password [email or web address]  02/26/2016, 12:10 AM   By signing my name below, I, Clerance Lav, attest that this documentation has been prepared under the direction and in the presence of Orvan Falconer, MD. Electronically signed: Clerance Lav, Scribe. 02/26/16 12:20AM

## 2016-02-26 NOTE — Evaluation (Signed)
Physical Therapy Evaluation Patient Details Name: Cheryl Abbott MRN: [id number] DOB: 08/06/1945 Today's Date: 02/26/2016   History of Present Illness  Cheryl Abbott is a 70 y.o. female with medical history significant of type 2 DM, HLD, GERD, depression, GAD, and CDK stage 3 presents to the ED with left-sided lip numbness and headache that onset around 9pm tonight. She states that the numbness has not resided. She reports prior left-sided lip numbness. However, she did not go the ED at that time. Patient also reports drooling and "different feelings" in her left arm and left leg. She denies facial weakness and trouble speaking. Patient states that she lives alone and does not smoke or drink alcohol.  She came as a code stroke.  She was not a candidate for TPA, as her symptoms were mild.  Hospitalist was asked to admit her for TIA/CVA work up. She has been on ASA faithfully. In the past, she was on Metformin for her DM, and Norvasc for her HTN, and they were both taken off by her PCP.  She doesn't smoke and doesn't drink.    Clinical Impression  Patient very pleasant and willing to participate in skilled PT services this morning; patient able to complete all bed mobility, functional transfers, and gait with walker and modified independence, PT providing distant supervision and management of lines/leads primarily. Patient reports she feels to be mostly at her baseline, but does feel a little weak and that she is moving a bit slower than her norm. At this time we will provided skilled PT services with focus on functional strength and balance, however patient will likely benefit from skilled HHPT services upon her return home. Patient left supine in bed with alarm activated, call bell within reach, and all needs otherwise met.     Follow Up Recommendations Home health PT    Equipment Recommendations  None recommended by PT    Recommendations for Other Services       Precautions / Restrictions  Precautions Precautions: Fall Restrictions Weight Bearing Restrictions: No      Mobility  Bed Mobility Overal bed mobility: Modified Independent                Transfers Overall transfer level: Modified independent Equipment used: Rolling walker (2 wheeled)                Ambulation/Gait Ambulation/Gait assistance: Modified independent (Device/Increase time)   Assistive device: Rolling walker (2 wheeled) Gait Pattern/deviations: Step-through pattern;Trunk flexed     General Gait Details: slow but steady   Science writer    Modified Rankin (Stroke Patients Only)       Balance Overall balance assessment: Modified Independent;No apparent balance deficits (not formally assessed)                                           Pertinent Vitals/Pain Pain Assessment: No/denies pain    Home Living Family/patient expects to be discharged to:: Private residence Living Arrangements: Children;Alone Available Help at Discharge: Available PRN/intermittently;Family;Friend(s) Type of Home: House Home Access: Stairs to enter   [employer] of Steps: 3 Home Layout: One level Home Equipment: Walker - 2 wheels;Cane - single point      Prior Function Level of Independence: Independent  Hand Dominance        Extremity/Trunk Assessment   Upper Extremity Assessment: Defer to OT evaluation           Lower Extremity Assessment: Generalized weakness      Cervical / Trunk Assessment: Normal  Communication   Communication: No difficulties  Cognition Arousal/Alertness: Awake/alert Behavior During Therapy: WFL for tasks assessed/performed                        General Comments General comments (skin integrity, edema, etc.): able to sidestep/navigate in bathroom without holding onto walker, no significant balance concerns noted today     Exercises     Assessment/Plan     PT Assessment Patient needs continued PT services  PT Problem List Decreased strength;Decreased coordination;Decreased balance          PT Treatment Interventions      PT Goals (Current goals can be found in the Care Plan section)  Acute Rehab PT Goals Patient Stated Goal: to return to PLOF  PT Goal Formulation: With patient Time For Goal Achievement: 03/11/16 Potential to Achieve Goals: Good    Frequency Min 3X/week   Barriers to discharge        Co-evaluation               End of Session   Activity Tolerance: Patient tolerated treatment well Patient left: in bed;with call bell/phone within reach;with bed alarm set      Functional Assessment Tool Used: Based on skilled clinical assessment of strength, gait, balance Functional Limitation: Mobility: Walking and moving around Mobility: Walking and Moving Around Current Status (D3220): At least 1 percent but less than 20 percent impaired, limited or restricted Mobility: Walking and Moving Around Goal Status [phone]): 0 percent impaired, limited or restricted    Time: 0623-7628 PT Time Calculation (min) (ACUTE ONLY): 24 min   Charges:   PT Evaluation $PT Eval Low Complexity: 1 Procedure PT Treatments $Self Care/Home Management: 8-22   PT G Codes:   PT G-Codes **NOT FOR INPATIENT CLASS** Functional Assessment Tool Used: Based on skilled clinical assessment of strength, gait, balance Functional Limitation: Mobility: Walking and moving around Mobility: Walking and Moving Around Current Status (B1517): At least 1 percent but less than 20 percent impaired, limited or restricted Mobility: Walking and Moving Around Goal Status [phone]): 0 percent impaired, limited or restricted    Deniece Ree PT, DPT [phone]

## 2016-02-26 NOTE — Care Management Obs Status (Signed)
[place] NOTIFICATION   Patient Details  Name: Cheryl Abbott MRN: [id number] Date of Birth: 02/14/1946   Medicare Observation Status Notification Given:  Yes    Briant Sites, RN 02/26/2016, 2:01 PM

## 2016-02-26 NOTE — Progress Notes (Signed)
[facility] PLAN OF CARE NOTE Patient: Cheryl Abbott YVO:[id number]   PCP: Tula Nakayama, MD DOB: [birth year]   DOA: 02/25/2016   DOS: 02/26/2016    Patient was admitted by my colleague Dr. Marin Comment earlier on 02/26/2016. I have reviewed the H&P as well as assessment and plan and agree with the same. Important changes in the plan are listed below.  Plan of care: Principal Problem:   Brain TIA Active Problems:   Hyperlipidemia LDL goal <100   Hypertension goal BP (blood pressure) < 130/80   Depression   Generalized anxiety disorder   CKD (chronic kidney disease) stage 3, GFR 30-59 ml/min   Lactose intolerance in adult   TIA (transient ischemic attack) MRI brain negative. Patient continues to have persistent symptoms. Neurology consulted for further input. We will monitor the patient. PT OT consulted.  Author: Berle Mull, MD [facility] Pager: [phone] 02/26/2016 5:09 PM   If 7PM-7AM, please contact night-coverage at [email or web address], password [facility]

## 2016-02-27 ENCOUNTER — Encounter (HOSPITAL_COMMUNITY): Payer: Self-pay | Admitting: *Deleted

## 2016-02-27 ENCOUNTER — Observation Stay (HOSPITAL_BASED_OUTPATIENT_CLINIC_OR_DEPARTMENT_OTHER): Payer: Medicare Other

## 2016-02-27 DIAGNOSIS — G459 Transient cerebral ischemic attack, unspecified: Secondary | ICD-10-CM

## 2016-02-27 LAB — COMPREHENSIVE METABOLIC PANEL
ALT: 14 U/L (ref 14–54)
AST: 15 U/L (ref 15–41)
Albumin: 3.8 g/dL (ref 3.5–5.0)
Alkaline Phosphatase: 83 U/L (ref 38–126)
Anion gap: 6 (ref 5–15)
BUN: 16 mg/dL (ref 6–20)
CO2: 26 mmol/L (ref 22–32)
Calcium: 9.7 mg/dL (ref 8.9–10.3)
Chloride: 105 mmol/L (ref 101–111)
Creatinine, Ser: 1.52 mg/dL — ABNORMAL HIGH (ref 0.44–1.00)
GFR calc Af Amer: 39 mL/min — ABNORMAL LOW (ref >60)
GFR calc non Af Amer: 33 mL/min — ABNORMAL LOW (ref >60)
Glucose, Bld: 109 mg/dL — ABNORMAL HIGH (ref 65–99)
Potassium: 4 mmol/L (ref 3.5–5.1)
Sodium: 137 mmol/L (ref 135–145)
Total Bilirubin: 0.5 mg/dL (ref 0.3–1.2)
Total Protein: 8.3 g/dL — ABNORMAL HIGH (ref 6.5–8.1)

## 2016-02-27 LAB — GLUCOSE, CAPILLARY
Glucose-Capillary: 110 mg/dL — ABNORMAL HIGH (ref 65–99)
Glucose-Capillary: 115 mg/dL — ABNORMAL HIGH (ref 65–99)
Glucose-Capillary: 99 mg/dL (ref 65–99)

## 2016-02-27 LAB — ECHOCARDIOGRAM COMPLETE
E decel time: 225 msec
E/e' ratio: 7.12
FS: 28 % (ref 28–44)
Height: 66 in
IVS/LV PW RATIO, ED: 0.9
LA ID, A-P, ES: 28 mm
LA diam end sys: 28 mm
LA diam index: 1.36 cm/m2
LA vol A4C: 25.3 ml
LA vol index: 15.2 mL/m2
LA vol: 31.3 mL
LV E/e' medial: 7.12
LV E/e'average: 7.12
LV PW d: 11.5 mm — AB (ref 0.6–1.1)
LV dias vol index: 24 mL/m2
LV dias vol: 50 mL (ref 46–106)
LV e' LATERAL: 7.51 cm/s
LV sys vol index: 9 mL/m2
LV sys vol: 19 mL (ref 14–42)
LVOT SV: 54 mL
LVOT VTI: 23.9 cm
LVOT area: 2.27 cm2
LVOT diameter: 17 mm
LVOT peak grad rest: 5 mmHg
LVOT peak vel: 114 cm/s
Lateral S' vel: 9.14 cm/s
MV Dec: 225
MV pk A vel: 80.9 m/s
MV pk E vel: 53.5 m/s
RV sys press: 20 mmHg
Reg peak vel: 204 cm/s
Simpson's disk: 62
Stroke v: 31 ml
TAPSE: 16.3 mm
TDI e' lateral: 7.51
TDI e' medial: 6.31
TR max vel: 204 cm/s
Weight: 3139.35 oz

## 2016-02-27 LAB — CBC WITH DIFFERENTIAL/PLATELET
Basophils Absolute: 0 10*3/uL (ref 0.0–0.1)
Basophils Relative: 1 %
Eosinophils Absolute: 0.2 10*3/uL (ref 0.0–0.7)
Eosinophils Relative: 3 %
HCT: 35.8 % — ABNORMAL LOW (ref 36.0–46.0)
Hemoglobin: 11.4 g/dL — ABNORMAL LOW (ref 12.0–15.0)
Lymphocytes Relative: 29 %
Lymphs Abs: 1.5 10*3/uL (ref 0.7–4.0)
MCH: 29.3 pg (ref 26.0–34.0)
MCHC: 31.8 g/dL (ref 30.0–36.0)
MCV: 92 fL (ref 78.0–100.0)
Monocytes Absolute: 0.5 10*3/uL (ref 0.1–1.0)
Monocytes Relative: 10 %
Neutro Abs: 3 10*3/uL (ref 1.7–7.7)
Neutrophils Relative %: 57 %
Platelets: 323 10*3/uL (ref 150–400)
RBC: 3.89 MIL/uL (ref 3.87–5.11)
RDW: 16.1 % — ABNORMAL HIGH (ref 11.5–15.5)
WBC: 5.3 10*3/uL (ref 4.0–10.5)

## 2016-02-27 LAB — HEMOGLOBIN A1C
Hgb A1c MFr Bld: 6.7 % — ABNORMAL HIGH (ref 4.8–5.6)
Mean Plasma Glucose: 146 mg/dL

## 2016-02-27 LAB — MAGNESIUM: Magnesium: 2.2 mg/dL (ref 1.7–2.4)

## 2016-02-27 MED ORDER — ASPIRIN EC 325 MG PO TBEC: 325.0000 mg | DELAYED_RELEASE_TABLET | Freq: Every day | ORAL | Status: DC

## 2016-02-27 MED ORDER — ASPIRIN EC 325 MG PO TBEC: 325.0000 mg | DELAYED_RELEASE_TABLET | Freq: Every day | ORAL | 0 refills | Status: DC

## 2016-02-27 NOTE — Care Management Note (Signed)
Case Management Note  Patient Details  Name: Cheryl Abbott MRN: [id number] Date of Birth: 1945-08-15  Subjective/Objective:  Patient adm from home with stroke like symptoms. She has been independent with ADL's. She lives alone, has a cane and walker at home if needed. She has been recommended for HHPT. Choice offered.                   Action/Plan: Romualdo Bolk of [facility] and will obtain orders from chart. Patient aware [facility] has 48 hours to make first visit.    Expected Discharge Date:      02/27/2016            Expected Discharge Plan:  [place]  In-House Referral:  NA  Discharge planning Services  CM Consult  Post Acute Care Choice:  NA Choice offered to:  NA  DME Arranged:    DME Agency:     HH Arranged:  RN, PT [place] Agency:  [place]  Status of Service:  Completed, signed off  If discussed at [place] of Stay Meetings, dates discussed:    Additional Comments:  Cheryl Abbott, Chauncey Reading, RN 02/27/2016, 1:52 PM

## 2016-02-27 NOTE — Progress Notes (Signed)
Patient's IV removed.  Site WNL.  AVS reviewed with patient.  Verbalized understanding of discharge instructions, physician follow-up, medications.  Patient educated that she is not to drive or operate heavy machinery per instructions until her she has follow-up appointment with Dr. Merlene Laughter - patient verbalized understanding.  Follow-up appointment with Dr. Merlene Laughter scheduled for tomorrow at 10:00 a.m. , and patient to arrive at 9:30 a.m.  Patient's daughter at bedside.  Patient stated she will be staying with her sister-in-law tonight.  Patient will leave her car here at hospital. Information given to patient regarding [facility] and transportation.  Patient transported by NT via w/c to main entrance for discharge.  Patient stable at time of discharge.

## 2016-02-27 NOTE — Progress Notes (Signed)
SLP Cancellation Note  Patient Details Name: THEDA PAYER MRN: [id number] DOB: Apr 10, 1946   Cancelled treatment:       Reason Eval/Treat Not Completed: Patient at procedure or test/unavailable; Attempted to see pt to assess for SLP needs, however pt in procedure. Pt passed RN swallow screen. SLP will check back as schedule permits for SLE if needed.  Thank you,  Genene Churn, [place]    [place] 02/27/2016, 4:21 PM

## 2016-02-27 NOTE — Progress Notes (Signed)
*  PRELIMINARY RESULTS* Echocardiogram 2D Echocardiogram has been performed.  Cheryl Abbott 02/27/2016, 4:05 PM

## 2016-02-27 NOTE — Evaluation (Signed)
Occupational Therapy Evaluation Patient Details Name: [name] MRN: [id number] DOB: 10-13-1945 Today's Date: 02/27/2016    History of Present Illness Cheryl Abbott is a 70 y.o. female with medical history significant of type 2 DM, HLD, GERD, depression, GAD, and CDK stage 3 presents to the ED with left-sided lip numbness and headache that onset around 9pm tonight. She states that the numbness has not resided. She reports prior left-sided lip numbness. However, she did not go the ED at that time. Patient also reports drooling and "different feelings" in her left arm and left leg. She denies facial weakness and trouble speaking. Patient states that she lives alone and does not smoke or drink alcohol.  She came as a code stroke.  She was not a candidate for TPA, as her symptoms were mild.  Hospitalist was asked to admit her for TIA/CVA work up. She has been on ASA faithfully. In the past, she was on Metformin for her DM, and Norvasc for her HTN, and they were both taken off by her PCP.  She doesn't smoke and doesn't drink.     Clinical Impression   Pt awake, alert, oriented x4 this afternoon, daughter present for evaluation. Pt demonstrates mod independence in ADL tasks and requires supervision for functional mobility due to not using walker or cane. Pt demonstrates LUE strength WFL, coordination and sensation intact. No further OT services required at this time.     Follow Up Recommendations  No OT follow up    Equipment Recommendations  None recommended by OT       Precautions / Restrictions Precautions Precautions: Fall Restrictions Weight Bearing Restrictions: No      Mobility Bed Mobility Overal bed mobility: Modified Independent                Transfers Overall transfer level: Modified independent                         ADL Overall ADL's : Modified independent;At baseline     Grooming: Wash/dry hands;Modified independent;Standing               Lower Body Dressing: Modified independent;Sitting/lateral leans   Toilet Transfer: Modified Independent;Regular Toilet;Grab bars   Toileting- Clothing Manipulation and Hygiene: Modified independent;Sit to/from stand       Functional mobility during ADLs: Supervision/safety       Vision Vision Assessment?: No apparent visual deficits          Pertinent Vitals/Pain Pain Assessment: No/denies pain     Hand Dominance Right   Extremity/Trunk Assessment Upper Extremity Assessment Upper Extremity Assessment: Overall WFL for tasks assessed   Lower Extremity Assessment Lower Extremity Assessment: Defer to PT evaluation   Cervical / Trunk Assessment Cervical / Trunk Assessment: Normal   Communication Communication Communication: No difficulties   Cognition Arousal/Alertness: Awake/alert Behavior During Therapy: WFL for tasks assessed/performed Overall Cognitive Status: Within Functional Limits for tasks assessed                                Home Living Family/patient expects to be discharged to:: Private residence Living Arrangements: Children;Alone Available Help at Discharge: Available PRN/intermittently;Family;Friend(s)               Bathroom Shower/Tub: Teacher, early years/pre: Standard     Home Equipment: Environmental consultant - 2 wheels;Cane - single point;Bedside commode;Shower seat  Prior Functioning/Environment Level of Independence: Independent                  End of Session    Activity Tolerance: Patient tolerated treatment well Patient left: in bed;with call bell/phone within reach;with family/visitor present   Time: 2712-9290 OT Time Calculation (min): 29 min Charges:  OT General Charges $OT Visit: 1 Procedure OT Evaluation $OT Eval Low Complexity: 1 Procedure G-Codes: OT G-codes **NOT FOR INPATIENT CLASS** Functional Assessment Tool Used: clinical judgement Functional Limitation: Self care Self Care  Current Status (R0301): At least 1 percent but less than 20 percent impaired, limited or restricted Self Care Goal Status (O9969): At least 1 percent but less than 20 percent impaired, limited or restricted Self Care Discharge Status [id number]): At least 1 percent but less than 20 percent impaired, limited or restricted   Guadelupe Sabin, OTR/L  [phone] 02/27/2016, 4:39 PM

## 2016-02-28 DIAGNOSIS — I10 Essential (primary) hypertension: Secondary | ICD-10-CM | POA: Diagnosis not present

## 2016-02-28 DIAGNOSIS — G458 Other transient cerebral ischemic attacks and related syndromes: Secondary | ICD-10-CM | POA: Diagnosis not present

## 2016-02-28 DIAGNOSIS — I672 Cerebral atherosclerosis: Secondary | ICD-10-CM | POA: Diagnosis not present

## 2016-02-28 NOTE — Discharge Summary (Signed)
[facility] Discharge Summary   Patient: Cheryl Abbott ZOX:[id number]   PCP: Tula Nakayama, MD DOB: [birth year]   Date of admission: 02/25/2016   Date of discharge: 02/27/2016     Discharge Diagnoses:  Principal Problem:   Brain TIA Active Problems:   Hyperlipidemia LDL goal <100   Hypertension goal BP (blood pressure) < 130/80   Depression   Generalized anxiety disorder   CKD (chronic kidney disease) stage 3, GFR 30-59 ml/min   Lactose intolerance in adult   TIA (transient ischemic attack)   Admitted From: Home Disposition:  Home with home health  Recommendations for Outpatient Follow-up:  1. Follow-up with PCP in one week. 2. Follow-up with neurology in 2 weeks.  Follow-up Information    Tula Nakayama, MD .   Specialty:  Family Medicine Why:  Call to schedule hospital follow-up appointment within one week Contact information: [address], [place] [address] [phone]        [facility], Trey Sailors, MD. Schedule an appointment as soon as possible for a visit in 2 week(s).   Specialty:  Neurology Why:  Hospital follow-up on Tuesday, 02/28/2016 at 10 a.m.  Please arrive for appointment at 9:30 a.m. Contact information: [address] [place] [address] [phone]          Diet recommendation: Cardiac diet  Activity: The patient is advised to gradually reintroduce usual activities.  Discharge Condition: good  Code Status: Full code  History of present illness: As per the H and P dictated on admission, "Cheryl Abbott is a 70 y.o. female with medical history significant of type 2 DM,  HLD, GERD, depression, GAD, and CDK stage 3 presents to the ED with left-sided lip numbness and headache that onset around 9pm tonight. She states that the numbness has not resided. She reports prior left-sided lip numbness. However, she did not go the ED at that time. Patient also reports drooling and "different feelings" in her left arm and left  leg. She denies facial weakness and trouble speaking. Patient states that she lives alone and does not smoke or drink alcohol.  She came as a code stroke.  She was not a candidate for TPA, as her symptoms were mild.  Hospitalist was asked to admit her for TIA/CVA work up. She has been on ASA faithfully. In the past, she was on Metformin for her DM, and Norvasc for her HTN, and they were both taken off by her PCP.  She doesn't smoke and doesn't drink.  "  Hospital Course:  Summary of her active problems in the hospital is as following.  Principal Problem:   TIA (transient ischemic attack) CT of the head as well as MRI brain unremarkable. Patient's symptoms gradually improving. PT recommends home health. LDL is 103 patient is orally on statin at home. Hemoglobin A1c 6.7. Telemetry did not show any evidence of atrial flutter or A. fib. Echogram shows 6-65% EF without any wall motion abnormality as well as a significant valvular disease with grade 1 diastolic dysfunction. Carotid Doppler shows minimal plaque at bifurcation without any significant stenosis. Patient passed stroke swallowing evaluation in the ER itself. With this I discussed the case with neurology and the patient will follow-up with neurology as an outpatient. Recommendation was to increase aspirin to 325 mg from 81 mg. Discontinue Plavix. Recommended follow-up with PCP as well.   Active Problems:   Hyperlipidemia LDL goal <100 Continue statin    Hypertension goal BP (blood pressure) <  130/80 Routine home medication.    Depression   Generalized anxiety disorder Continue home Paxil and Xanax. Reportedly patient has seen snakes at her home. The daughter is concerned that this could be hallucination. Patient has not reported any concerns or similar hallucination here in the hospital.    CKD (chronic kidney disease) stage 3, GFR 30-59 ml/min Renal function stable. Continue to monitor.  All other chronic medical condition  were stable during the hospitalization.  Patient was seen by physical therapy, who recommended home health, which was arranged by Education officer, museum and case Freight forwarder. On the day of the discharge the patient's vitals are stable, and no other acute medical condition were reported by patient. the patient was felt safe to be discharge at home with home health.  Procedures and Results:  Echocardiogram Study Conclusions  - Left ventricle: The cavity size was normal. Wall thickness was   increased in a pattern of mild LVH. Systolic function was normal.   The estimated ejection fraction was in the range of 60% to 65%.   Wall motion was normal; there were no regional wall motion   abnormalities. Doppler parameters are consistent with abnormal   left ventricular relaxation (grade 1 diastolic dysfunction). - Aortic valve: Mildly to moderately calcified annulus. Trileaflet;   mildly thickened leaflets. Valve area (VTI): 1.86 cm^2. Valve   area (Vmax): 1.95 cm^2. Valve area (Vmean): 1.75 cm^2. - Technically adequate study   Carotid Doppler  IMPRESSION: Very minimal soft atherosclerotic plaque at the bifurcations and proximal internal carotid arteries. No hemodynamically significant Stenosis.  Consultations:  Phone consultation with neurology  DISCHARGE MEDICATION: Discharge Medication List as of 02/27/2016 12:18 PM    CONTINUE these medications which have CHANGED   Details  aspirin EC 325 MG tablet Take 1 tablet (325 mg total) by mouth daily., Starting Mon 02/27/2016, Normal      CONTINUE these medications which have NOT CHANGED   Details  ALPRAZolam (XANAX) 0.5 MG tablet Take 1 tablet (0.5 mg total) by mouth 2 (two) times daily., Starting Tue 02/21/2016, Print    clotrimazole-betamethasone (LOTRISONE) cream Apply 1 application topically 2 (two) times daily as needed (for rash)., Historical Med    docusate sodium (COLACE) 100 MG capsule Take 200 mg by mouth at bedtime. , Until Discontinued,  Historical Med    lovastatin (MEVACOR) 40 MG tablet Take 80 mg by mouth at bedtime., Historical Med    ondansetron (ZOFRAN) 4 MG tablet Take 1 tablet (4 mg total) by mouth every 8 (eight) hours as needed for nausea or vomiting., Starting 08/26/2015, Until Discontinued, Normal    pantoprazole (PROTONIX) 40 MG tablet Take 1 tablet (40 mg total) by mouth 2 (two) times daily., Starting 08/26/2015, Until Discontinued, Normal    PARoxetine (PAXIL) 40 MG tablet Take 40 mg by mouth daily., Historical Med    Vitamin D, Ergocalciferol, (DRISDOL) 50000 units CAPS capsule Take 50,000 Units by mouth every 7 (seven) days., Historical Med       Allergies  Allergen Reactions  . Ace Inhibitors Cough  . Keflex [Cephalexin] Diarrhea and Nausea And Vomiting  . Nitrofurantoin Diarrhea and Nausea And Vomiting  . Penicillins Hives and Other (See Comments)    Reaction:  Blisters on hands/feet  Has patient had a PCN reaction causing immediate rash, facial/tongue/throat swelling, SOB or lightheadedness with hypotension: Yes Has patient had a PCN reaction causing severe rash involving mucus membranes or skin necrosis: No Has patient had a PCN reaction that required hospitalization No Has patient  had a PCN reaction occurring within the last 10 years: No If all of the above answers are "NO", then may proceed with Cephalosporin use.   Discharge Instructions    Diet - low sodium heart healthy    Complete by:  As directed    Discharge instructions    Complete by:  As directed    It is important that you read following instructions as well as go over your medication list with RN to help you understand your care after this hospitalization.  Discharge Instructions: Please follow-up with PCP in one week  Please request your primary care physician to go over all Hospital Tests and Procedure/Radiological results at the follow up,  Please get all Hospital records sent to your PCP by signing hospital release before you  go home.   Do not drive, operating heavy machinery, perform activities at heights, swimming or participation in water activities or provide baby sitting services; until you have been seen by Primary Care Physician or a Neurologist and advised to do so again. Do not take more than prescribed Pain, Sleep and Anxiety Medications. You were cared for by a hospitalist during your hospital stay. If you have any questions about your discharge medications or the care you received while you were in the hospital after you are discharged, you can call the unit and ask to speak with the hospitalist on call if the hospitalist that took care of you is not available.  Once you are discharged, your primary care physician will handle any further medical issues. Please note that NO REFILLS for any discharge medications will be authorized once you are discharged, as it is imperative that you return to your primary care physician (or establish a relationship with a primary care physician if you do not have one) for your aftercare needs so that they can reassess your need for medications and monitor your lab values. You Must read complete instructions/literature along with all the possible adverse reactions/side effects for all the Medicines you take and that have been prescribed to you. Take any new Medicines after you have completely understood and accept all the possible adverse reactions/side effects. Wear Seat belts while driving. If you have smoked or chewed Tobacco in the last 2 yrs please stop smoking and/or stop any Recreational drug use.   Increase activity slowly    Complete by:  As directed      Discharge Exam: Filed Weights   02/25/16 2239 02/26/16 0200  Weight: 88.5 kg (195 lb) 89 kg (196 lb 3.4 oz)   Vitals:   02/27/16 0600 02/27/16 1010  BP: (!) 146/83 126/65  Pulse: 82 88  Resp: 16 18  Temp: 98.4 F (36.9 C) 98.6 F (37 C)   General: Appear in no distress, no Rash; Oral Mucosa  moist. Cardiovascular: S1 and S2 Present, no Murmur, no JVD Respiratory: Bilateral Air entry present and Clear to Auscultation, no Crackles, no wheezes Abdomen: Bowel Sound present, Soft and no tenderness Extremities: no Pedal edema, no calf tenderness Neurology: Grossly no focal neuro deficit. Very small area of numbness on the left lip.  The results of significant diagnostics from this hospitalization (including imaging, microbiology, ancillary and laboratory) are listed below for reference.    Significant Diagnostic Studies: Mr Brain Wo Contrast  Result Date: 02/26/2016 CLINICAL DATA:  Possible TIA. LEFT-sided lip numbness and headache, with normal neurologic exam. Stroke risk factors include hypertension, diabetes mellitus, and hyperlipidemia. Initial encounter. EXAM: MRI HEAD WITHOUT CONTRAST MRA HEAD WITHOUT CONTRAST  TECHNIQUE: Multiplanar, multiecho pulse sequences of the brain and surrounding structures were obtained without intravenous contrast. Angiographic images of the head were obtained using MRA technique without contrast. COMPARISON:  CT head 02/25/2016. FINDINGS: MRI HEAD FINDINGS No evidence for acute infarction, hemorrhage, mass lesion, hydrocephalus, or extra-axial fluid. Global atrophy. Hydrocephalus ex vacuo. Moderate T2 and FLAIR hyperintensities throughout the white matter, also involving the brainstem, representing chronic microvascular ischemic change. Flow voids are maintained throughout the carotid, basilar, and vertebral arteries. There are no areas of chronic hemorrhage. Unremarkable visualized calvarium, skullbase, and cervical vertebrae. Pituitary, pineal, cerebellar tonsils unremarkable. No upper cervical cord lesions. Visualized calvarium, skull base, and upper cervical osseous structures unremarkable. Scalp and extracranial soft tissues, orbits, sinuses, and mastoids show no acute process. MRA HEAD FINDINGS Internal carotid arteries are dolichoectatic. 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 Mild irregularity of the vertical petrous segment on the LEFT. LEFT anterior circulation normal with widely patent A1 and M1 segments of the anterior and middle cerebral arteries respectively. Normal LEFT MCA bifurcation. On the RIGHT, there is a tapering stenosis of the distal M1 MCA extending into both M2 branches, which appears fairly severe and likely flow reducing. No significant A1 ACA disease. Both anterior cerebral arteries distally are widely patent. Basilar artery is widely patent. Vertebrals codominant without V4 stenosis. Mild irregularity of the proximal posterior cerebral artery segments. There is poor flow related enhancement in the RIGHT PCA beyond the P2 segment, suspected intracranial atherosclerotic disease. Similar changes beginning at the P2-P3 junction on the LEFT. No visible cerebellar branch occlusion or saccular aneurysm. IMPRESSION: No acute stroke. Global atrophy with moderate chronic microvascular ischemic change, but no large vessel infarct. Potentially symptomatic flow reducing lesions of the petrous RIGHT ICA, distal RIGHT M1 and proximal M2 MCA branches, as well as both posterior cerebral arteries, RIGHT greater than LEFT. These findings are most commonly seen in diabetic related intracranial atherosclerotic disease. Electronically Signed   By: Staci Righter M.D.   On: 02/26/2016 14:23   US Carotid Bilateral (at [facility] And [facility] Only)  Result Date: 02/26/2016 CLINICAL DATA:  Left eye blurriness today. EXAM: BILATERAL CAROTID DUPLEX ULTRASOUND TECHNIQUE: Pearline Cables scale imaging, color Doppler and duplex ultrasound were performed of bilateral carotid and vertebral arteries in the neck. COMPARISON:  Head CT 02/25/2016 and MRI brain 02/26/2016 FINDINGS: Criteria: Quantification of carotid stenosis is based on velocity parameters that correlate the residual internal carotid diameter with NASCET-based stenosis  levels, using the diameter of the distal internal carotid lumen as the denominator for stenosis measurement. The following velocity measurements were obtained: RIGHT ICA:  45 cm/sec CCA:  932 cm/sec SYSTOLIC ICA/CCA RATIO:  0.5 DIASTOLIC ICA/CCA RATIO:  1.0 ECA:  89 cm/sec LEFT ICA:  82 cm/sec CCA:  98 cm/sec SYSTOLIC ICA/CCA RATIO:  0.8 DIASTOLIC ICA/CCA RATIO:  1.3 ECA:  70 cm/sec RIGHT CAROTID ARTERY: Common carotid artery within normal. Minimal soft plaque at the carotid bulb and origin of the internal carotid artery. Doppler waveforms are within normal. Tortuosity of the internal carotid artery. RIGHT VERTEBRAL ARTERY:  Normal antegrade flow. LEFT CAROTID ARTERY: Common carotid artery within normal. Mild soft atherosclerotic plaque at the bifurcation and origin of the internal carotid artery. Doppler waveforms are within normal. LEFT VERTEBRAL ARTERY:  Normal antegrade flow. IMPRESSION: Very minimal soft atherosclerotic plaque at the bifurcations and proximal internal carotid arteries. No hemodynamically significant stenosis. Normal anterior flow within the vertebral arteries. Electronically Signed  By: Marin Olp M.D.   On: 02/26/2016 14:42   Mr Jodene Nam Head/brain AC Cm  Result Date: 02/26/2016 CLINICAL DATA:  Possible TIA. LEFT-sided lip numbness and headache, with normal neurologic exam. Stroke risk factors include hypertension, diabetes mellitus, and hyperlipidemia. Initial encounter. EXAM: MRI HEAD WITHOUT CONTRAST MRA HEAD WITHOUT CONTRAST TECHNIQUE: Multiplanar, multiecho pulse sequences of the brain and surrounding structures were obtained without intravenous contrast. Angiographic images of the head were obtained using MRA technique without contrast. COMPARISON:  CT head 02/25/2016. FINDINGS: MRI HEAD FINDINGS No evidence for acute infarction, hemorrhage, mass lesion, hydrocephalus, or extra-axial fluid. Global atrophy. Hydrocephalus ex vacuo. Moderate T2 and FLAIR hyperintensities throughout the  white matter, also involving the brainstem, representing chronic microvascular ischemic change. Flow voids are maintained throughout the carotid, basilar, and vertebral arteries. There are no areas of chronic hemorrhage. Unremarkable visualized calvarium, skullbase, and cervical vertebrae. Pituitary, pineal, cerebellar tonsils unremarkable. No upper cervical cord lesions. Visualized calvarium, skull base, and upper cervical osseous structures unremarkable. Scalp and extracranial soft tissues, orbits, sinuses, and mastoids show no acute process. MRA HEAD FINDINGS Internal carotid arteries are dolichoectatic. 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 Mild irregularity of the vertical petrous segment on the LEFT. LEFT anterior circulation normal with widely patent A1 and M1 segments of the anterior and middle cerebral arteries respectively. Normal LEFT MCA bifurcation. On the RIGHT, there is a tapering stenosis of the distal M1 MCA extending into both M2 branches, which appears fairly severe and likely flow reducing. No significant A1 ACA disease. Both anterior cerebral arteries distally are widely patent. Basilar artery is widely patent. Vertebrals codominant without V4 stenosis. Mild irregularity of the proximal posterior cerebral artery segments. There is poor flow related enhancement in the RIGHT PCA beyond the P2 segment, suspected intracranial atherosclerotic disease. Similar changes beginning at the P2-P3 junction on the LEFT. No visible cerebellar branch occlusion or saccular aneurysm. IMPRESSION: No acute stroke. Global atrophy with moderate chronic microvascular ischemic change, but no large vessel infarct. Potentially symptomatic flow reducing lesions of the petrous RIGHT ICA, distal RIGHT M1 and proximal M2 MCA branches, as well as both posterior cerebral arteries, RIGHT greater than LEFT. These findings are most commonly seen in diabetic related  intracranial atherosclerotic disease. Electronically Signed   By: Staci Righter M.D.   On: 02/26/2016 14:23   Ct Head Code Stroke W/o Cm  Result Date: 02/25/2016 CLINICAL DATA:  Code stroke.  Left-sided facial numbness. EXAM: CT HEAD WITHOUT CONTRAST TECHNIQUE: Contiguous axial images were obtained from the base of the skull through the vertex without intravenous contrast. COMPARISON:  08/18/2015 CT head. FINDINGS: No evidence of large acute infarct, focal mass effect, intracranial hemorrhage, hydrocephalus, or extra-axial collection. Stable lucency within the right anterior lentiform nucleus extending into right frontal periventricular white matter probably representing an old infarct. Mild parenchymal volume loss and chronic microvascular ischemic changes. No hyperdense vessel identified. Mild calcific atherosclerosis of cavernous internal carotid arteries. Visualized paranasal sinuses, mastoid air cells, and orbits are unremarkable. No displaced calvarial fracture or suspicious osseous lesion. Nonspecific dural calcifications along the anterior medial aspects of left middle cranial fossa. ASPECTS [facility] Stroke Program Early CT Score) - Ganglionic level infarction (caudate, lentiform nuclei, internal capsule, insula, M1-M3 cortex): 7 - Supraganglionic infarction (M4-M6 cortex): 3 Total score (0-10 with 10 being normal): 10 IMPRESSION: 1. No acute intracranial abnormality is identified. If symptoms persist or if clinically indicated MRI is more sensitive  for acute stroke. 2. ASPECTS is 10 3. 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 These results were called by telephone at the time of interpretation on 02/25/2016 at 11:29 pm to Dr. Noemi Chapel , who verbally acknowledged these results. Electronically Signed   By: Kristine Garbe M.D.   On: 02/25/2016 23:30     Microbiology: No results found for this or any previous visit (from the past 240 hour(s)).   Labs: CBC:  Recent Labs Lab 02/25/16 2254 02/25/16 2301 02/26/16 0704 02/27/16 0708  WBC 7.8  --  7.2 5.3  NEUTROABS 4.5  --  4.2 3.0  HGB 11.4* 12.6 11.0* 11.4*  HCT 34.8* 37.0 33.5* 35.8*  MCV 91.8  --  92.3 92.0  PLT 317  --  314 997   Basic Metabolic Panel:  Recent Labs Lab 02/25/16 2254 02/25/16 2301 02/26/16 0704 02/27/16 0708  NA 137 139 136 137  K 4.1 4.1 3.9 4.0  CL 104 104 103 105  CO2 25  --  24 26  GLUCOSE 93 91 90 109*  BUN 21* 19 17 16   CREATININE 1.64* 1.60* 1.52* 1.52*  CALCIUM 9.6  --  9.2 9.7  MG  --   --   --  2.2   Liver Function Tests:  Recent Labs Lab 02/25/16 2254 02/26/16 0704 02/27/16 0708  AST 15 13* 15  ALT 14 12* 14  ALKPHOS 86 76 83  BILITOT 0.2* 0.3 0.5  PROT 8.5* 7.6 8.3*  ALBUMIN 3.9 3.5 3.8   No results for input(s): LIPASE, AMYLASE in the last 168 hours. No results for input(s): AMMONIA in the last 168 hours. Cardiac Enzymes: No results for input(s): CKTOTAL, CKMB, CKMBINDEX, TROPONINI in the last 168 hours. BNP (last 3 results) No results for input(s): BNP in the last 8760 hours. CBG:  Recent Labs Lab 02/26/16 1617 02/26/16 2138 02/27/16 0743 02/27/16 1131 02/27/16 1650  GLUCAP 88 116* 110* 115* 99   Time spent: 30 minutes  Signed:  Jamal Pavon  [facility] 02/27/2016 , 7:50 AM

## 2016-03-01 DIAGNOSIS — I129 Hypertensive chronic kidney disease with stage 1 through stage 4 chronic kidney disease, or unspecified chronic kidney disease: Secondary | ICD-10-CM | POA: Diagnosis not present

## 2016-03-01 DIAGNOSIS — R269 Unspecified abnormalities of gait and mobility: Secondary | ICD-10-CM | POA: Diagnosis not present

## 2016-03-01 DIAGNOSIS — Z8673 Personal history of transient ischemic attack (TIA), and cerebral infarction without residual deficits: Secondary | ICD-10-CM | POA: Diagnosis not present

## 2016-03-01 DIAGNOSIS — G4733 Obstructive sleep apnea (adult) (pediatric): Secondary | ICD-10-CM | POA: Diagnosis not present

## 2016-03-01 DIAGNOSIS — K219 Gastro-esophageal reflux disease without esophagitis: Secondary | ICD-10-CM | POA: Diagnosis not present

## 2016-03-01 DIAGNOSIS — E1142 Type 2 diabetes mellitus with diabetic polyneuropathy: Secondary | ICD-10-CM | POA: Diagnosis not present

## 2016-03-01 DIAGNOSIS — Z7982 Long term (current) use of aspirin: Secondary | ICD-10-CM | POA: Diagnosis not present

## 2016-03-01 DIAGNOSIS — E1122 Type 2 diabetes mellitus with diabetic chronic kidney disease: Secondary | ICD-10-CM | POA: Diagnosis not present

## 2016-03-01 DIAGNOSIS — N183 Chronic kidney disease, stage 3 (moderate): Secondary | ICD-10-CM | POA: Diagnosis not present

## 2016-03-05 ENCOUNTER — Ambulatory Visit: Payer: Medicare Other | Admitting: Family Medicine

## 2016-03-05 DIAGNOSIS — E1122 Type 2 diabetes mellitus with diabetic chronic kidney disease: Secondary | ICD-10-CM | POA: Diagnosis not present

## 2016-03-05 DIAGNOSIS — K219 Gastro-esophageal reflux disease without esophagitis: Secondary | ICD-10-CM | POA: Diagnosis not present

## 2016-03-05 DIAGNOSIS — G4733 Obstructive sleep apnea (adult) (pediatric): Secondary | ICD-10-CM | POA: Diagnosis not present

## 2016-03-05 DIAGNOSIS — Z8673 Personal history of transient ischemic attack (TIA), and cerebral infarction without residual deficits: Secondary | ICD-10-CM | POA: Diagnosis not present

## 2016-03-05 DIAGNOSIS — I129 Hypertensive chronic kidney disease with stage 1 through stage 4 chronic kidney disease, or unspecified chronic kidney disease: Secondary | ICD-10-CM | POA: Diagnosis not present

## 2016-03-05 DIAGNOSIS — Z7982 Long term (current) use of aspirin: Secondary | ICD-10-CM | POA: Diagnosis not present

## 2016-03-05 DIAGNOSIS — N183 Chronic kidney disease, stage 3 (moderate): Secondary | ICD-10-CM | POA: Diagnosis not present

## 2016-03-05 DIAGNOSIS — E1142 Type 2 diabetes mellitus with diabetic polyneuropathy: Secondary | ICD-10-CM | POA: Diagnosis not present

## 2016-03-05 DIAGNOSIS — R269 Unspecified abnormalities of gait and mobility: Secondary | ICD-10-CM | POA: Diagnosis not present

## 2016-03-06 ENCOUNTER — Ambulatory Visit (INDEPENDENT_AMBULATORY_CARE_PROVIDER_SITE_OTHER): Payer: Medicare Other | Admitting: Family Medicine

## 2016-03-06 ENCOUNTER — Encounter: Payer: Self-pay | Admitting: Family Medicine

## 2016-03-06 VITALS — BP 138/84 | HR 95 | Ht 66.0 in | Wt 195.1 lb

## 2016-03-06 DIAGNOSIS — E1121 Type 2 diabetes mellitus with diabetic nephropathy: Secondary | ICD-10-CM

## 2016-03-06 DIAGNOSIS — E785 Hyperlipidemia, unspecified: Secondary | ICD-10-CM | POA: Diagnosis not present

## 2016-03-06 DIAGNOSIS — F411 Generalized anxiety disorder: Secondary | ICD-10-CM

## 2016-03-06 DIAGNOSIS — I10 Essential (primary) hypertension: Secondary | ICD-10-CM

## 2016-03-06 DIAGNOSIS — G459 Transient cerebral ischemic attack, unspecified: Secondary | ICD-10-CM

## 2016-03-06 MED ORDER — PAROXETINE HCL 40 MG PO TABS: 40.0000 mg | ORAL_TABLET | Freq: Every day | ORAL | 1 refills | Status: DC

## 2016-03-06 NOTE — Progress Notes (Signed)
Chief Complaint  Patient presents with  . Hospitalization Follow-up    TIA- was discharged from hospital on 9/18. Seen [place] last tuesday   " Date of admission: 02/25/2016             Date of discharge: 02/27/2016    Discharge Diagnoses:  Principal Problem:   Brain TIA Active Problems:   Hyperlipidemia LDL goal <100   Hypertension goal BP (blood pressure) < 130/80   Depression   Generalized anxiety disorder   CKD (chronic kidney disease) stage 3, GFR 30-59 ml/min   Lactose intolerance in adult   TIA (transient ischemic attack) PATEL, PRANAV         [facility] 02/27/2016 , 7:50 Am  " from record  Patient is here for hospital follow up. She has multiple risk factors for cerebrovascular disease including Dm, HTN, HLD and CKD.  She had face numbness at home and presented to the Er. Labs revealed well controlled DM with an A1c of 6.7 HTN not well controlled with BP diastolics lately in the 196-222 range LDL is 105 - near goa for diabetic but under goal for secondary prevention stroke Per hospital record "Telemetry did not show any evidence of atrial flutter or A. fib. Echogram shows 6-65% EF without any wall motion abnormality as well as a significant valvular disease with grade 1 diastolic dysfunction. Carotid Doppler shows minimal plaque at bifurcation without any significant stenosis. Patient passed stroke swallowing evaluation in the ER itself." Dr patel  MRA brain:  "IMPRESSION: No acute stroke.  Global atrophy with moderate chronic microvascular ischemic change, but no large vessel infarct.  Potentially symptomatic flow reducing lesions of the petrous RIGHT ICA, distal RIGHT M1 and proximal M2 MCA branches, as well as both posterior cerebral arteries, RIGHT greater than LEFT. These findings are most commonly seen in diabetic related intracranial atherosclerotic disease.  Electronically Signed   By: Staci Righter M.D.   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  Is terribly afraid of snakes.  She believes one got into her house through the dryer vent.  Says her family has searched the home.  The hospital dc summary mentions there is family concern about a hallucination.  She is lucid today as she tells this story.  Unfortunately still believes the snake is hiding in her house.   Patient Active Problem List   Diagnosis Date Noted  . TIA (transient ischemic attack) 02/26/2016  . Lactose intolerance in adult 02/01/2016  . Brain TIA 08/19/2015  . Fall 08/19/2015  . Generalized weakness 08/19/2015  . CKD (chronic kidney disease) stage 3, GFR 30-59 ml/min 07/12/2015  . Constipation 05/19/2015  . Lipoma of arm 01/19/2015  . Back pain   . Allergic cough 12/07/2014  . Anemia 10/14/2014  . Encounter for examination following treatment at hospital 05/11/2014  . Lumbar back pain with radiculopathy affecting left lower extremity 05/11/2014  . Nocturnal hypoxia 02/04/2014  . Generalized anxiety disorder 05/20/2013  . Dysphagia, idiopathic 03/04/2013  . Depression 08/13/2012  . Abnormal brain scan 02/07/2012  . Thoracic or lumbosacral neuritis or radiculitis, unspecified 12/22/2010  . Hypercalcemia 11/30/2010  . Colon adenomas 05/11/2009  . HEMORRHOIDS 03/15/2009  . GERD 03/15/2009  . FATTY LIVER DISEASE 03/15/2009  . RENAL CALCULUS 03/15/2009  . Sleep apnea 09/15/2008  .  Type 2 diabetes with nephropathy ([place]) 02/02/2008  . Hyperlipidemia LDL goal <100 09/17/2007  . Hypertension goal BP (blood pressure) < 130/80 09/17/2007    Outpatient Encounter Prescriptions as of 03/06/2016  Medication Sig  . ALPRAZolam (XANAX) 0.5 MG tablet Take 1 tablet (0.5 mg total) by mouth 2 (two) times daily.  Marland Kitchen aspirin EC 325 MG tablet Take 1 tablet (325 mg total) by mouth daily.  .  clopidogrel (PLAVIX) 75 MG tablet Take 75 mg by mouth daily.  . clotrimazole-betamethasone (LOTRISONE) cream Apply 1 application topically 2 (two) times daily as needed (for rash).  Marland Kitchen docusate sodium (COLACE) 100 MG capsule Take 200 mg by mouth at bedtime.   . lovastatin (MEVACOR) 40 MG tablet Take 80 mg by mouth at bedtime.  . pantoprazole (PROTONIX) 40 MG tablet Take 1 tablet (40 mg total) by mouth 2 (two) times daily.  Marland Kitchen PARoxetine (PAXIL) 40 MG tablet Take 1 tablet (40 mg total) by mouth daily.  . potassium chloride (K-DUR) 10 MEQ tablet Take 10 mEq by mouth 3 (three) times daily.  . ondansetron (ZOFRAN) 4 MG tablet Take 1 tablet (4 mg total) by mouth every 8 (eight) hours as needed for nausea or vomiting. (Patient not taking: Reported on 03/06/2016)  . Vitamin D, Ergocalciferol, (DRISDOL) 50000 units CAPS capsule Take 50,000 Units by mouth every 7 (seven) days.   No facility-administered encounter medications on file as of 03/06/2016.     Past Medical History:  Diagnosis Date  . Anemia   . Anxiety   . Anxiety disorder   . Arthritis   . 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 Chronic back pain   . Depression   . Fatty liver   . GERD (gastroesophageal reflux disease)   . Headache   . History of adenomatous polyp of colon    2008  . History of kidney stones   . Hypercalcemia   . Hyperlipidemia   . Hypertension   . Left ureteral stone   . Lipoma of arm 01/19/2015  . Lumbar disc disease with radiculopathy   . Nephrolithiasis   . Neuropathy, peripheral ([place])   . OSA treated with BiPAP    per study 2007  . Sciatica   . Sigmoid diverticulosis   . Type 2 diabetes mellitus ([place])     Past Surgical History:  Procedure Laterality Date  . BIOPSY N/A 03/17/2013   Procedure: GASTRIC BIOPSIES;  Surgeon: Danie Binder, MD;  Location: [facility];  Service: Endoscopy;  Laterality: N/A;  . BIOPSY  06/10/2015   Procedure: BIOPSY;  Surgeon: Danie Binder, MD;  Location[facility];   Service: Endoscopy;;  gastric biopsy  . CARDIAC CATHETERIZATION  05-11-2003  dr Shelva Majestic ([facility])   Abnormal cardiolite/   Normal coronary arteries and normal LVF,  ef  63%  . CARDIOVASCULAR STRESS TEST  01-01-2014   normal lexiscan cardiolite/  no ischemia/ infarct/  normal LV function and wall motion ,  ef 81%  . COLONOSCOPY  last one 10/02/2011   MOD [place] TICS, IH, NEXT TCS APR 2018  . CYSTO/   RIGHT URETEROSOCOPY LASER LITHOTRIPSY STONE EXTRACTION  10-13-2004  . CYSTO/  RIGHT RETROGRADE PYELOGRAM/  PLACMENT RIGHT URETERAL STENT  01-10-2010  . CYSTOSCOPY W/ URETERAL STENT PLACEMENT Right 08/19/2015   Procedure: CYSTOSCOPY WITH RETROGRADE PYELOGRAM/URETERAL STENT PLACEMENT;  Surgeon: Franchot Gallo, MD;  Location: [facility];  Service: Urology;  Laterality: Right;  . CYSTOSCOPY WITH RETROGRADE PYELOGRAM, URETEROSCOPY AND STENT PLACEMENT  Left 10/21/2014   Procedure: CYSTOSCOPY WITH RETROGRADE PYELOGRAM, URETEROSCOPY AND STENT PLACEMENT;  Surgeon: Franchot Gallo, MD;  Location: [facility];  Service: Urology;  Laterality: Left;  . CYSTOSCOPY WITH RETROGRADE PYELOGRAM, URETEROSCOPY AND STENT PLACEMENT Right 11/22/2015   Procedure: CYSTOSCOPY, RIGHT URETERAL STENT REMOVAL; RIGHT RETROGRADE PYELOGRAM, RIGHT URETEROSCOPY WITH BALLOON DILATION; RIGHT URETERAL STENT PLACEMENT;  Surgeon: Franchot Gallo, MD;  Location: [facility];  Service: Urology;  Laterality: Right;  . CYSTOSCOPY/RETROGRADE/URETEROSCOPY/STONE EXTRACTION WITH BASKET Bilateral 08/02/2015   Procedure: CYSTOSCOPY; BILATERAL RETROGRADE PYELOGRAMS; BILATERAL URETEROSCOPY, STONE EXTRACTION WITH BASKET;  Surgeon: Franchot Gallo, MD;  Location: [facility];  Service: Urology;  Laterality: Bilateral;  . CYSTOSCOPY/RETROGRADE/URETEROSCOPY/STONE EXTRACTION WITH BASKET Right 06/28/2015   Procedure: CYSTOSCOPY/RETROGRADE/URETEROSCOPY/STONE EXTRACTION WITH BASKET, RIGHT URETERAL DOUBLE J STENT PLACEMENT;  Surgeon: Franchot Gallo, MD;  Location: [facility];  Service: Urology;  Laterality: Right;  . ESOPHAGOGASTRODUODENOSCOPY (EGD) WITH PROPOFOL N/A 03/17/2013   Procedure: ESOPHAGOGASTRODUODENOSCOPY (EGD) WITH PROPOFOL;  Surgeon: Danie Binder, MD;  Location: [facility];  Service: Endoscopy;  Laterality: N/A;  . ESOPHAGOGASTRODUODENOSCOPY (EGD) WITH PROPOFOL N/A 06/10/2015   Procedure: ESOPHAGOGASTRODUODENOSCOPY (EGD) WITH PROPOFOL;  Surgeon: Danie Binder, MD;  [facility];  Service: Endoscopy;  Laterality: N/A;  1245 - pt knows to arrive at 8:30  . FLEXIBLE SIGMOIDOSCOPY  09/11/2011   WJX:[id number] Internal hemorrhoids  . HOLMIUM LASER APPLICATION Left 11/29/3084   Procedure: HOLMIUM LASER APPLICATION;  Surgeon: Franchot Gallo, MD;  [facility];  Service: Urology;  Laterality: Left;  . HOLMIUM LASER APPLICATION Right 5/78/4696   Procedure: HOLMIUM LASER APPLICATION;  Surgeon: Franchot Gallo, MD;  Location: [facility];  Service: Urology;  Laterality: Right;  . MASS EXCISION Left 03/04/2015   Procedure: EXCISION OF SOFT TISSUE NEOPLASM LEFT ARM;  Surgeon: Aviva Signs, MD;  Location: [facility];  Service: General;  Laterality: Left;  . PERCUTANEOUS NEPHROSTOLITHOTOMY Bilateral 12/  2011     [facility]  . POLYPECTOMY N/A 03/17/2013   Procedure: GASTRIC POLYPECTOMY;  Surgeon: Danie Binder, MD;  Location: [facility];  Service: Endoscopy;  Laterality: N/A;  . REMOVAL RIGHT THIGH CYST  2006  . SAVORY DILATION N/A 03/17/2013   Procedure: SAVORY DILATION;  Surgeon: Danie Binder, MD;  Location: [facility];  Service: Endoscopy;  Laterality: N/A;  #12.8, 14, 15, 16 dilators used  . SAVORY DILATION N/A 06/10/2015   Procedure: SAVORY DILATION;  Surgeon: Danie Binder, MD;  [facility];  Service: Endoscopy;  Laterality: N/A;  . TRANSTHORACIC ECHOCARDIOGRAM  01-01-2014   mild LVH/  ef 29-52%/  grade I diastolic dysfunction/  trivial MR, TR, and PR  . VAGINAL HYSTERECTOMY  1970's    Social  History   Social History  . Marital status: Divorced    Spouse name: N/A  . Number of children: 2  . Years of education: N/A   Occupational History  . retired     Social History Main Topics  . Smoking status: Former Smoker    Packs/day: 1.00    Years: 20.00    Types: Cigarettes    Quit date: 09/21/1994  . Smokeless tobacco: Never Used     Comment: Quit x 20 years  . Alcohol use No  . Drug use: No  . Sexual activity: No   Other Topics Concern  . Not on file   Social History Narrative   Patient is right handed   Patient drinks some caffeine daily.    Family History  Problem Relation Age of Onset  . Hypertension Mother   . Diabetes Mother   . Heart failure Mother   . Dementia Mother   . Emphysema Father   . Hypertension Father   . Diabetes Brother   . GER disease Brother   . Hypertension Sister   . Hypertension Sister   . Cancer      family history   . Diabetes      family history   . Heart defect      famiily history   . Arthritis      family history   . Anesthesia problems Neg Hx   . Hypotension Neg Hx   . Malignant hyperthermia Neg Hx   . Pseudochol deficiency Neg Hx   . Colon cancer Neg Hx     Review of Systems  Constitutional: Positive for malaise/fatigue. Negative for chills, fever and weight loss.  HENT: Negative for congestion and hearing loss.   Eyes: Negative for blurred vision and pain.  Respiratory: Negative for cough and shortness of breath.   Cardiovascular: Negative for chest pain and leg swelling.  Gastrointestinal: Negative for abdominal pain, constipation, diarrhea and heartburn.  Genitourinary: Negative for dysuria and frequency.  Musculoskeletal: Negative for falls, joint pain and myalgias.  Neurological: Negative for dizziness, seizures and headaches.  Psychiatric/Behavioral: Negative for depression. The patient is nervous/anxious. The patient does not have insomnia.     BP 138/84 (BP Location: Left Arm, Patient Position:  Sitting)   Pulse 95   Ht 5\' 6"  (1.676 m)   Wt 195 lb 1.9 oz (88.5 kg)   SpO2 95%   BMI 31.49 kg/m   Physical Exam  Constitutional: She is oriented to person, place, and time. She appears well-developed and well-nourished.  Hesitant speech  HENT:  Head: Normocephalic and atraumatic.  Right Ear: External ear normal.  Left Ear: External ear normal.  Mouth/Throat: Oropharynx is clear and moist.  Eyes: Conjunctivae and EOM are normal. Pupils are equal, round, and reactive to light.  Neck: Normal range of motion. Neck supple. No thyromegaly present.  NO BRUIT  Cardiovascular: Normal rate, regular rhythm, normal heart sounds and intact distal pulses.   No murmur heard. Pulmonary/Chest: Effort normal and breath sounds normal. No respiratory distress.  Abdominal: Soft. Bowel sounds are normal. There is no tenderness.  No hepatomegaly  Musculoskeletal: Normal range of motion. She exhibits no edema.  Lymphadenopathy:    She has no cervical adenopathy.  Neurological: She is alert and oriented to person, place, and time. She has normal reflexes. Coordination normal.  Gait normal  Skin: Skin is warm and dry.  Psychiatric: She has a normal mood and affect. Her behavior is normal. Thought content normal.  Calm, articulate  Nursing note and vitals reviewed.  ASSESSMENT/PLAN:  1. Transient cerebral ischemia, unspecified transient cerebral ischemia type Continue neurology follow up.  ASA, and plavix  2. Hypertension goal BP (blood pressure) < 130/80 No change  3. Type 2 diabetes with nephropathy (HCC) Well controlled  4. Hyperlipidemia LDL goal <100 Not at goal.  Will leave decision to usual PCP whether she needs different statin  5. Generalized anxiety disorder On Paxil 40 a day ad Xanax twice a day. Question of hallucination.  I favor that she either saw a snake, or a sock she believes was a snake and is obsessing about this due to her phobia.   Patient Instructions  Your diabetes  is doing very well Hemoglobin A1c is 6.7  Blood pressure is  good today  Continue current medicines  Follow up with Dr Moshe Cipro as scheduled     Raylene Everts, MD

## 2016-03-06 NOTE — Patient Instructions (Signed)
Your diabetes is doing very well Hemoglobin A1c is 6.7  Blood pressure is good today  Continue current medicines  Follow up with Dr Moshe Cipro as scheduled

## 2016-03-07 ENCOUNTER — Emergency Department (HOSPITAL_COMMUNITY): Payer: Medicare Other

## 2016-03-07 ENCOUNTER — Telehealth: Payer: Self-pay

## 2016-03-07 ENCOUNTER — Encounter (HOSPITAL_COMMUNITY): Payer: Self-pay

## 2016-03-07 ENCOUNTER — Emergency Department (HOSPITAL_COMMUNITY)
Admission: EM | Admit: 2016-03-07 | Discharge: 2016-03-07 | Disposition: A | Discharge status: Home / Self Care | Payer: Medicare Other | Attending: Emergency Medicine | Admitting: Emergency Medicine

## 2016-03-07 DIAGNOSIS — Z7982 Long term (current) use of aspirin: Secondary | ICD-10-CM | POA: Insufficient documentation

## 2016-03-07 DIAGNOSIS — Z8673 Personal history of transient ischemic attack (TIA), and cerebral infarction without residual deficits: Secondary | ICD-10-CM | POA: Diagnosis not present

## 2016-03-07 DIAGNOSIS — E1122 Type 2 diabetes mellitus with diabetic chronic kidney disease: Secondary | ICD-10-CM | POA: Diagnosis not present

## 2016-03-07 DIAGNOSIS — K219 Gastro-esophageal reflux disease without esophagitis: Secondary | ICD-10-CM | POA: Diagnosis not present

## 2016-03-07 DIAGNOSIS — G4733 Obstructive sleep apnea (adult) (pediatric): Secondary | ICD-10-CM | POA: Diagnosis not present

## 2016-03-07 DIAGNOSIS — Z79899 Other long term (current) drug therapy: Secondary | ICD-10-CM | POA: Insufficient documentation

## 2016-03-07 DIAGNOSIS — I129 Hypertensive chronic kidney disease with stage 1 through stage 4 chronic kidney disease, or unspecified chronic kidney disease: Secondary | ICD-10-CM | POA: Insufficient documentation

## 2016-03-07 DIAGNOSIS — M6281 Muscle weakness (generalized): Secondary | ICD-10-CM | POA: Diagnosis not present

## 2016-03-07 DIAGNOSIS — E1142 Type 2 diabetes mellitus with diabetic polyneuropathy: Secondary | ICD-10-CM | POA: Diagnosis not present

## 2016-03-07 DIAGNOSIS — R531 Weakness: Secondary | ICD-10-CM | POA: Diagnosis not present

## 2016-03-07 DIAGNOSIS — R51 Headache: Secondary | ICD-10-CM | POA: Diagnosis not present

## 2016-03-07 DIAGNOSIS — R5383 Other fatigue: Secondary | ICD-10-CM | POA: Diagnosis present

## 2016-03-07 DIAGNOSIS — Z87891 Personal history of nicotine dependence: Secondary | ICD-10-CM | POA: Insufficient documentation

## 2016-03-07 DIAGNOSIS — R404 Transient alteration of awareness: Secondary | ICD-10-CM | POA: Diagnosis not present

## 2016-03-07 DIAGNOSIS — R269 Unspecified abnormalities of gait and mobility: Secondary | ICD-10-CM | POA: Diagnosis not present

## 2016-03-07 DIAGNOSIS — N183 Chronic kidney disease, stage 3 (moderate): Secondary | ICD-10-CM | POA: Insufficient documentation

## 2016-03-07 LAB — URINALYSIS, ROUTINE W REFLEX MICROSCOPIC
Bilirubin Urine: NEGATIVE
Glucose, UA: NEGATIVE mg/dL
Hgb urine dipstick: NEGATIVE
Ketones, ur: NEGATIVE mg/dL
Nitrite: NEGATIVE
Specific Gravity, Urine: 1.015 (ref 1.005–1.030)
pH: 6.5 (ref 5.0–8.0)

## 2016-03-07 LAB — URINE MICROSCOPIC-ADD ON

## 2016-03-07 LAB — CBC WITH DIFFERENTIAL/PLATELET
Basophils Absolute: 0 10*3/uL (ref 0.0–0.1)
Basophils Relative: 1 %
Eosinophils Absolute: 0.2 10*3/uL (ref 0.0–0.7)
Eosinophils Relative: 3 %
HCT: 36.3 % (ref 36.0–46.0)
Hemoglobin: 11.8 g/dL — ABNORMAL LOW (ref 12.0–15.0)
Lymphocytes Relative: 33 %
Lymphs Abs: 2.3 10*3/uL (ref 0.7–4.0)
MCH: 29.9 pg (ref 26.0–34.0)
MCHC: 32.5 g/dL (ref 30.0–36.0)
MCV: 91.9 fL (ref 78.0–100.0)
Monocytes Absolute: 0.6 10*3/uL (ref 0.1–1.0)
Monocytes Relative: 9 %
Neutro Abs: 3.7 10*3/uL (ref 1.7–7.7)
Neutrophils Relative %: 54 %
Platelets: 324 10*3/uL (ref 150–400)
RBC: 3.95 MIL/uL (ref 3.87–5.11)
RDW: 16.2 % — ABNORMAL HIGH (ref 11.5–15.5)
WBC: 6.9 10*3/uL (ref 4.0–10.5)

## 2016-03-07 LAB — COMPREHENSIVE METABOLIC PANEL
ALT: 14 U/L (ref 14–54)
AST: 15 U/L (ref 15–41)
Albumin: 3.9 g/dL (ref 3.5–5.0)
Alkaline Phosphatase: 82 U/L (ref 38–126)
Anion gap: 7 (ref 5–15)
BUN: 16 mg/dL (ref 6–20)
CO2: 23 mmol/L (ref 22–32)
Calcium: 9.3 mg/dL (ref 8.9–10.3)
Chloride: 103 mmol/L (ref 101–111)
Creatinine, Ser: 1.46 mg/dL — ABNORMAL HIGH (ref 0.44–1.00)
GFR calc Af Amer: 41 mL/min — ABNORMAL LOW (ref >60)
GFR calc non Af Amer: 35 mL/min — ABNORMAL LOW (ref >60)
Glucose, Bld: 78 mg/dL (ref 65–99)
Potassium: 4.3 mmol/L (ref 3.5–5.1)
Sodium: 133 mmol/L — ABNORMAL LOW (ref 135–145)
Total Bilirubin: 0.3 mg/dL (ref 0.3–1.2)
Total Protein: 8.6 g/dL — ABNORMAL HIGH (ref 6.5–8.1)

## 2016-03-07 MED ORDER — AMLODIPINE BESYLATE 10 MG PO TABS: 10.0000 mg | ORAL_TABLET | Freq: Every day | ORAL | 0 refills | Status: DC

## 2016-03-07 NOTE — ED Triage Notes (Signed)
Pt reports her home health nurse came out at 10 am this morning and pt was c/o feeling generalized weakness, increased bp, and headache.  Pt Recently admitted for TIA.

## 2016-03-07 NOTE — Telephone Encounter (Signed)
Needs to go back on the amlodipine.  Please pend Rx

## 2016-03-07 NOTE — ED Provider Notes (Signed)
[facility] DEPT Provider Note   CSN: [id number] Arrival date & time: 03/07/16  1604     History   Chief Complaint Chief Complaint  Patient presents with  . Fatigue    HPI Cheryl Abbott is a 70 y.o. female.  HPI   70 year old female who presents with fatigue. History of DM, HTN, CKD. Recent admission for TIA work-up 9/16-9/18/17. Discharged with home health. States she has not been sleeping since discharge. Up until 3-4 AM since discharge. This morning, feeling tired and weak after her home nurse woke her up. They check her BP and the diastolic elevated to 258 and 106. It was recommended that she came to ED. States nagging mild frontal headache, non-radiating this morning as well. Still has left upper and lower lip tingling that she had when she was admitted for TIA work-up. That is unchanged. Denies focal numbness, weakness, fever, n/v, abd pain, chest pain, dyspnea, cough, urinary complaints.  Past Medical History:  Diagnosis Date  . Anemia   . Anxiety   . Anxiety disorder   . Arthritis   . 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 Chronic back pain   . Depression   . Fatty liver   . GERD (gastroesophageal reflux disease)   . Headache   . History of adenomatous polyp of colon    2008  . History of kidney stones   . Hypercalcemia   . Hyperlipidemia   . Hypertension   . Left ureteral stone   . Lipoma of arm 01/19/2015  . Lumbar disc disease with radiculopathy   . Nephrolithiasis   . Neuropathy, peripheral ([place])   . OSA treated with BiPAP    per study 2007  . Sciatica   . Sigmoid diverticulosis   . Type 2 diabetes mellitus [facility])     Patient Active Problem List   Diagnosis Date Noted  . TIA (transient ischemic attack) 02/26/2016  . Lactose intolerance in adult 02/01/2016  . Brain TIA 08/19/2015  . Fall 08/19/2015  . Generalized weakness 08/19/2015  . CKD (chronic kidney disease) stage 3, GFR 30-59 ml/min 07/12/2015  . Constipation 05/19/2015  .  Lipoma of arm 01/19/2015  . Back pain   . Allergic cough 12/07/2014  . Anemia 10/14/2014  . Encounter for examination following treatment at hospital 05/11/2014  . Lumbar back pain with radiculopathy affecting left lower extremity 05/11/2014  . Nocturnal hypoxia 02/04/2014  . Generalized anxiety disorder 05/20/2013  . Dysphagia, idiopathic 03/04/2013  . Depression 08/13/2012  . Abnormal brain scan 02/07/2012  . Thoracic or lumbosacral neuritis or radiculitis, unspecified 12/22/2010  . Hypercalcemia 11/30/2010  . Colon adenomas 05/11/2009  . HEMORRHOIDS 03/15/2009  . GERD 03/15/2009  . FATTY LIVER DISEASE 03/15/2009  . RENAL CALCULUS 03/15/2009  . Sleep apnea 09/15/2008  . 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 Hyperlipidemia LDL goal <100 09/17/2007  . Hypertension goal BP (blood pressure) < 130/80 09/17/2007    Past Surgical History:  Procedure Laterality Date  . BIOPSY N/A 03/17/2013   Procedure: GASTRIC BIOPSIES;  Surgeon: Danie Binder, MD;  Location: [facility];  Service: Endoscopy;  Laterality: N/A;  . BIOPSY  06/10/2015   Procedure: BIOPSY;  Surgeon: Danie Binder, MD;  Location: [facility];  Service: Endoscopy;;  gastric biopsy  . CARDIAC CATHETERIZATION  05-11-2003  dr Shelva Majestic ([facility])   Abnormal cardiolite/   Normal coronary arteries and normal LVF,  ef  63%  . CARDIOVASCULAR STRESS TEST  01-01-2014   normal lexiscan cardiolite/  no ischemia/ infarct/  normal LV function and wall motion ,  ef 81%  . COLONOSCOPY  last one 10/02/2011   MOD [place] TICS, IH, NEXT TCS APR 2018  . CYSTO/   RIGHT URETEROSOCOPY LASER LITHOTRIPSY STONE EXTRACTION  10-13-2004  . CYSTO/  RIGHT RETROGRADE PYELOGRAM/  PLACMENT RIGHT URETERAL STENT  01-10-2010  . CYSTOSCOPY W/ URETERAL STENT PLACEMENT Right 08/19/2015   Procedure: CYSTOSCOPY WITH RETROGRADE PYELOGRAM/URETERAL STENT PLACEMENT;  Surgeon: Franchot Gallo, MD;  Location: [facility];  Service: Urology;   Laterality: Right;  . CYSTOSCOPY WITH RETROGRADE PYELOGRAM, URETEROSCOPY AND STENT PLACEMENT Left 10/21/2014   Procedure: CYSTOSCOPY WITH RETROGRADE PYELOGRAM, URETEROSCOPY AND STENT PLACEMENT;  Surgeon: Franchot Gallo, MD;  Location: [facility];  Service: Urology;  Laterality: Left;  . CYSTOSCOPY WITH RETROGRADE PYELOGRAM, URETEROSCOPY AND STENT PLACEMENT Right 11/22/2015   Procedure: CYSTOSCOPY, RIGHT URETERAL STENT REMOVAL; RIGHT RETROGRADE PYELOGRAM, RIGHT URETEROSCOPY WITH BALLOON DILATION; RIGHT URETERAL STENT PLACEMENT;  Surgeon: Franchot Gallo, MD;  Location: [facility];  Service: Urology;  Laterality: Right;  . CYSTOSCOPY/RETROGRADE/URETEROSCOPY/STONE EXTRACTION WITH BASKET Bilateral 08/02/2015   Procedure: CYSTOSCOPY; BILATERAL RETROGRADE PYELOGRAMS; BILATERAL URETEROSCOPY, STONE EXTRACTION WITH BASKET;  Surgeon: Franchot Gallo, MD;  Location: [facility];  Service: Urology;  Laterality: Bilateral;  . CYSTOSCOPY/RETROGRADE/URETEROSCOPY/STONE EXTRACTION WITH BASKET Right 06/28/2015   Procedure: CYSTOSCOPY/RETROGRADE/URETEROSCOPY/STONE EXTRACTION WITH BASKET, RIGHT URETERAL DOUBLE J STENT PLACEMENT;  Surgeon: Franchot Gallo, MD;  Location: [facility];  Service: Urology;  Laterality: Right;  . ESOPHAGOGASTRODUODENOSCOPY (EGD) WITH PROPOFOL N/A 03/17/2013   Procedure: ESOPHAGOGASTRODUODENOSCOPY (EGD) WITH PROPOFOL;  Surgeon: Danie Binder, MD;  Location[facility];  Service: Endoscopy;  Laterality: N/A;  . ESOPHAGOGASTRODUODENOSCOPY (EGD) WITH PROPOFOL N/A 06/10/2015   Procedure: ESOPHAGOGASTRODUODENOSCOPY (EGD) WITH PROPOFOL;  Surgeon: Danie Binder, MD;  [facility];  Service: Endoscopy;  Laterality: N/A;  1245 - pt knows to arrive at 8:30  . FLEXIBLE SIGMOIDOSCOPY  09/11/2011   [id number] Internal hemorrhoids  . HOLMIUM LASER APPLICATION Left 0/48/8891   Procedure: HOLMIUM LASER APPLICATION;  Surgeon: Franchot Gallo, MD;  Location: [facility];   Service: Urology;  Laterality: Left;  . HOLMIUM LASER APPLICATION Right 6/94/5038   Procedure: HOLMIUM LASER APPLICATION;  Surgeon: Franchot Gallo, MD;  Location: [facility];  Service: Urology;  Laterality: Right;  . MASS EXCISION Left 03/04/2015   Procedure: EXCISION OF SOFT TISSUE NEOPLASM LEFT ARM;  Surgeon: Aviva Signs, MD;  Location: [facility];  Service: General;  Laterality: Left;  . PERCUTANEOUS NEPHROSTOLITHOTOMY Bilateral 12/  2011     [facility]  . POLYPECTOMY N/A 03/17/2013   Procedure: GASTRIC POLYPECTOMY;  Surgeon: Danie Binder, MD;  Location: [facility];  Service: Endoscopy;  Laterality: N/A;  . REMOVAL RIGHT THIGH CYST  2006  . SAVORY DILATION N/A 03/17/2013   Procedure: SAVORY DILATION;  Surgeon: Danie Binder, MD;  Location: [facility];  Service: Endoscopy;  Laterality: N/A;  #12.8, 14, 15, 16 dilators used  . SAVORY DILATION N/A 06/10/2015   Procedure: SAVORY DILATION;  Surgeon: Danie Binder, MD;  [facility];  Service: Endoscopy;  Laterality: N/A;  . TRANSTHORACIC ECHOCARDIOGRAM  01-01-2014   mild LVH/  ef 88-28%/  grade I diastolic dysfunction/  trivial MR, TR, and PR  . VAGINAL HYSTERECTOMY  1970's    OB History    Gravida Para Term Preterm AB Living   2 2 2     2    SAB TAB Ectopic Multiple  Live Births                   Home Medications    Prior to Admission medications   Medication Sig Start Date End Date Taking? Authorizing Provider  ALPRAZolam Duanne Moron) 0.5 MG tablet Take 1 tablet (0.5 mg total) by mouth 2 (two) times daily. 02/21/16  Yes [name], MD  amLODipine (NORVASC) 10 MG tablet Take 1 tablet (10 mg total) by mouth daily. 03/07/16  Yes Raylene Everts, MD  aspirin EC 325 MG tablet Take 1 tablet (325 mg total) by mouth daily. 02/27/16  Yes Lavina Hamman, MD  clopidogrel (PLAVIX) 75 MG tablet Take 75 mg by mouth daily.   Yes Historical Provider, MD  docusate sodium (COLACE) 100 MG capsule Take 200 mg by mouth at bedtime.    Yes Historical  Provider, MD  lovastatin (MEVACOR) 40 MG tablet Take 80 mg by mouth at bedtime.   Yes Historical Provider, MD  pantoprazole (PROTONIX) 40 MG tablet Take 1 tablet (40 mg total) by mouth 2 (two) times daily. 08/26/15  Yes [name], MD  PARoxetine (PAXIL) 40 MG tablet Take 1 tablet (40 mg total) by mouth daily. 03/06/16  Yes Raylene Everts, MD  potassium chloride (K-DUR) 10 MEQ tablet Take 10 mEq by mouth 2 (two) times daily.    Yes Historical Provider, MD  Vitamin D, Ergocalciferol, (DRISDOL) 50000 units CAPS capsule Take 50,000 Units by mouth every 7 (seven) days.   Yes Historical Provider, MD  ondansetron (ZOFRAN) 4 MG tablet Take 1 tablet (4 mg total) by mouth every 8 (eight) hours as needed for nausea or vomiting. Patient not taking: Reported on 03/06/2016 08/26/15   [name], MD    Family History Family History  Problem Relation Age of Onset  . Hypertension Mother   . Diabetes Mother   . Heart failure Mother   . Dementia Mother   . Emphysema Father   . Hypertension Father   . Diabetes Brother   . GER disease Brother   . Hypertension Sister   . Hypertension Sister   . Cancer      family history   . Diabetes      family history   . Heart defect      famiily history   . Arthritis      family history   . Anesthesia problems Neg Hx   . Hypotension Neg Hx   . Malignant hyperthermia Neg Hx   . Pseudochol deficiency Neg Hx   . Colon cancer Neg Hx     Social History Social History  Substance Use Topics  . Smoking status: Former Smoker    Packs/day: 1.00    Years: 20.00    Types: Cigarettes    Quit date: 09/21/1994  . Smokeless tobacco: Never Used     Comment: Quit x 20 years  . Alcohol use No     Allergies   Ace inhibitors; Keflex [cephalexin]; Nitrofurantoin; and Penicillins   Review of Systems Review of Systems 10/14 systems reviewed and are negative other than those stated in the HPI \  Physical Exam Updated Vital Signs BP 139/78 (BP  Location: Left Arm)   Pulse 65   Temp 98 F (36.7 C) (Oral)   Resp 18   Ht 5\' 6"  (1.676 m)   Wt 195 lb (88.5 kg)   SpO2 96%   BMI 31.47 kg/m   Physical Exam Physical Exam  Nursing note and vitals  reviewed. Constitutional: Well developed, well nourished, non-toxic, and in no acute distress Head: Normocephalic and atraumatic.  Mouth/Throat: Oropharynx is clear and moist.  Neck: Normal range of motion. Neck supple.  Cardiovascular: Normal rate and regular rhythm.   Pulmonary/Chest: Effort normal and breath sounds normal.  Abdominal: Soft. There is no tenderness. There is no rebound and no guarding.  Musculoskeletal: Normal range of motion.  Neurological: Alert, no facial droop, fluent speech, moves all extremities symmetrically, sensation to light touch in tact throughout Skin: Skin is warm and dry.  Psychiatric: Cooperative   ED Treatments / Results  Labs (all labs ordered are listed, but only abnormal results are displayed) Labs Reviewed  CBC WITH DIFFERENTIAL/PLATELET - Abnormal; Notable for the following:       Result Value   Hemoglobin 11.8 (*)    RDW 16.2 (*)    All other components within normal limits  URINALYSIS, ROUTINE W REFLEX MICROSCOPIC (NOT AT [facility]) - Abnormal; Notable for the following:    Protein, ur TRACE (*)    Leukocytes, UA SMALL (*)    All other components within normal limits  COMPREHENSIVE METABOLIC PANEL - Abnormal; Notable for the following:    Sodium 133 (*)    Creatinine, Ser 1.46 (*)    Total Protein 8.6 (*)    GFR calc non Af Amer 35 (*)    GFR calc Af Amer 41 (*)    All other components within normal limits  URINE MICROSCOPIC-ADD ON - Abnormal; Notable for the following:    Squamous Epithelial / LPF 6-30 (*)    Bacteria, UA RARE (*)    All other components within normal limits  URINE CULTURE    EKG  EKG Interpretation None       Radiology Dg Chest 2 View  Result Date: 03/07/2016 CLINICAL DATA:  Headache, elevated blood  pressure, fatigue and generalized weakness EXAM: CHEST  2 VIEW COMPARISON:  Chest x-ray of 01/27/2016 FINDINGS: No active infiltrate or effusion is seen. Mediastinal and hilar contours are unremarkable. The heart is within normal limits in size. Only mild degenerative change is noted in the mid to lower thoracic spine. IMPRESSION: No active cardiopulmonary disease. Electronically Signed   By: Ivar Drape M.D.   On: 03/07/2016 17:13   Ct Head Wo Contrast  Result Date: 03/07/2016 CLINICAL DATA:  Generalized weakness, increased blood pressure and headache. EXAM: CT HEAD WITHOUT CONTRAST TECHNIQUE: Contiguous axial images were obtained from the base of the skull through the vertex without intravenous contrast. COMPARISON:  Head CT dated 02/25/2016. Also head CT dated 05/10/2013. FINDINGS: Brain: Again noted is mild generalized age related parenchymal atrophy with commensurate dilatation of the ventricles and sulci. Mild chronic small vessel ischemic changes again noted within the bilateral periventricular and subcortical white matter regions. There is no mass, hemorrhage, edema or other evidence of acute parenchymal abnormality. No extra-axial hemorrhage. Vascular: No hyperdense vessel or unexpected calcification. Skull: Normal. Negative for fracture or focal lesion. Sinuses/Orbits: No acute finding. Other: None. IMPRESSION: 1. No acute intracranial abnormality. No intracranial mass, hemorrhage or edema. 2. Mild atrophy and chronic ischemic changes in the white matter. Electronically Signed   By: Franki Cabot M.D.   On: 03/07/2016 17:17    Procedures Procedures (including critical care time)  Medications Ordered in ED Medications - No data to display   Initial Impression / Assessment and Plan / ED Course  I have reviewed the triage vital signs and the nursing notes.  Pertinent labs & imaging  results that were available during my care of the patient were reviewed by me and considered in my medical  decision making (see chart for details).  Clinical Course    Presented with elevated blood pressure at home and generalized weakness. On arrival to the ED she is nontoxic in no acute distress. Vital signs stable with blood pressures in the 130s to 140s over 60s to 90s. No concern for hypertensive urgency or emergency. She has no focal deficits. Does states that she has tingling in her upper and lower left lip, but this is unchanged from when she was recently admitted to the hospital for TIA and had a negative MRI. history also recently had complete TIA workup including carotid Dopplers that were unremarkable and echo w/ mild LVH but otherwise unremarkable, on chart review. No new neuro deficits or concerns for TIA/CVA or other acute neurologic process. Basic blood work here revealing no major electrolyte or metabolic derangements. No evidence of infection in her urine, blood, or chest x-ray. She is observed here in the emergency department and does feels improved without any intervention. Is able to ambulate steadily with walker at her baseline. States that she is at her baseline currently. I feel that she is appropriate for discharge home. Her PCP just started her on blood pressure medication today which she will continue starting tomorrow.  Fin al Clinical Impressions(s) / ED Diagnoses   Final diagnoses:  Generalized weakness    New Prescriptions Discharge Medication List as of 03/07/2016  9:18 PM       Forde Dandy, MD 03/08/16 [id number]

## 2016-03-07 NOTE — ED Notes (Signed)
Pt updated on plan of care, states "i am ready to go"

## 2016-03-07 NOTE — Telephone Encounter (Signed)
Called pt-kept getting disconnected after pt answered. Then left a voicemail message that bp was being sent in

## 2016-03-07 NOTE — Telephone Encounter (Signed)
Confirmed with pharmacy previous dose of Amlodipine.   Medication sent to pharmacy.   Home health aware and will try to contact patient

## 2016-03-07 NOTE — Discharge Instructions (Signed)
Drink plenty of fluids, eat well, and get plenty of rest.  Your blood pressure was not concerning in the ED. You can start taking your blood pressure medication tomorrow that your doctor written for you.  Return for worsening symptoms, including worsening pain, fever, confusion, new numbness/weakness of one side versus the other, new vision or speech changes, or any other symptoms concerning to you.

## 2016-03-07 NOTE — ED Notes (Signed)
Pt states she is unable to provide UA at this time.

## 2016-03-07 NOTE — ED Notes (Signed)
Patient ambulated in hallway with two person assist. Patient ambulated well with no complaints.

## 2016-03-07 NOTE — ED Notes (Signed)
cbg 134 per ems.

## 2016-03-08 DIAGNOSIS — Z8673 Personal history of transient ischemic attack (TIA), and cerebral infarction without residual deficits: Secondary | ICD-10-CM | POA: Diagnosis not present

## 2016-03-08 DIAGNOSIS — Z7982 Long term (current) use of aspirin: Secondary | ICD-10-CM | POA: Diagnosis not present

## 2016-03-08 DIAGNOSIS — N183 Chronic kidney disease, stage 3 (moderate): Secondary | ICD-10-CM | POA: Diagnosis not present

## 2016-03-08 DIAGNOSIS — R269 Unspecified abnormalities of gait and mobility: Secondary | ICD-10-CM | POA: Diagnosis not present

## 2016-03-08 DIAGNOSIS — I129 Hypertensive chronic kidney disease with stage 1 through stage 4 chronic kidney disease, or unspecified chronic kidney disease: Secondary | ICD-10-CM | POA: Diagnosis not present

## 2016-03-08 DIAGNOSIS — G4733 Obstructive sleep apnea (adult) (pediatric): Secondary | ICD-10-CM | POA: Diagnosis not present

## 2016-03-08 DIAGNOSIS — E1122 Type 2 diabetes mellitus with diabetic chronic kidney disease: Secondary | ICD-10-CM | POA: Diagnosis not present

## 2016-03-08 DIAGNOSIS — E1142 Type 2 diabetes mellitus with diabetic polyneuropathy: Secondary | ICD-10-CM | POA: Diagnosis not present

## 2016-03-08 DIAGNOSIS — K219 Gastro-esophageal reflux disease without esophagitis: Secondary | ICD-10-CM | POA: Diagnosis not present

## 2016-03-09 DIAGNOSIS — N183 Chronic kidney disease, stage 3 (moderate): Secondary | ICD-10-CM | POA: Diagnosis not present

## 2016-03-09 DIAGNOSIS — E1122 Type 2 diabetes mellitus with diabetic chronic kidney disease: Secondary | ICD-10-CM | POA: Diagnosis not present

## 2016-03-09 DIAGNOSIS — Z7982 Long term (current) use of aspirin: Secondary | ICD-10-CM | POA: Diagnosis not present

## 2016-03-09 DIAGNOSIS — R269 Unspecified abnormalities of gait and mobility: Secondary | ICD-10-CM | POA: Diagnosis not present

## 2016-03-09 DIAGNOSIS — K219 Gastro-esophageal reflux disease without esophagitis: Secondary | ICD-10-CM | POA: Diagnosis not present

## 2016-03-09 DIAGNOSIS — I129 Hypertensive chronic kidney disease with stage 1 through stage 4 chronic kidney disease, or unspecified chronic kidney disease: Secondary | ICD-10-CM | POA: Diagnosis not present

## 2016-03-09 DIAGNOSIS — Z8673 Personal history of transient ischemic attack (TIA), and cerebral infarction without residual deficits: Secondary | ICD-10-CM | POA: Diagnosis not present

## 2016-03-09 DIAGNOSIS — G4733 Obstructive sleep apnea (adult) (pediatric): Secondary | ICD-10-CM | POA: Diagnosis not present

## 2016-03-09 DIAGNOSIS — E1142 Type 2 diabetes mellitus with diabetic polyneuropathy: Secondary | ICD-10-CM | POA: Diagnosis not present

## 2016-03-09 LAB — URINE CULTURE: Culture: 9000 — AB

## 2016-03-12 DIAGNOSIS — G4733 Obstructive sleep apnea (adult) (pediatric): Secondary | ICD-10-CM | POA: Diagnosis not present

## 2016-03-12 DIAGNOSIS — N183 Chronic kidney disease, stage 3 (moderate): Secondary | ICD-10-CM | POA: Diagnosis not present

## 2016-03-12 DIAGNOSIS — I129 Hypertensive chronic kidney disease with stage 1 through stage 4 chronic kidney disease, or unspecified chronic kidney disease: Secondary | ICD-10-CM | POA: Diagnosis not present

## 2016-03-12 DIAGNOSIS — Z8673 Personal history of transient ischemic attack (TIA), and cerebral infarction without residual deficits: Secondary | ICD-10-CM | POA: Diagnosis not present

## 2016-03-12 DIAGNOSIS — E1142 Type 2 diabetes mellitus with diabetic polyneuropathy: Secondary | ICD-10-CM | POA: Diagnosis not present

## 2016-03-12 DIAGNOSIS — Z7982 Long term (current) use of aspirin: Secondary | ICD-10-CM | POA: Diagnosis not present

## 2016-03-12 DIAGNOSIS — K219 Gastro-esophageal reflux disease without esophagitis: Secondary | ICD-10-CM | POA: Diagnosis not present

## 2016-03-12 DIAGNOSIS — E1122 Type 2 diabetes mellitus with diabetic chronic kidney disease: Secondary | ICD-10-CM | POA: Diagnosis not present

## 2016-03-12 DIAGNOSIS — R269 Unspecified abnormalities of gait and mobility: Secondary | ICD-10-CM | POA: Diagnosis not present

## 2016-03-14 DIAGNOSIS — E1122 Type 2 diabetes mellitus with diabetic chronic kidney disease: Secondary | ICD-10-CM | POA: Diagnosis not present

## 2016-03-14 DIAGNOSIS — K219 Gastro-esophageal reflux disease without esophagitis: Secondary | ICD-10-CM | POA: Diagnosis not present

## 2016-03-14 DIAGNOSIS — Z8673 Personal history of transient ischemic attack (TIA), and cerebral infarction without residual deficits: Secondary | ICD-10-CM | POA: Diagnosis not present

## 2016-03-14 DIAGNOSIS — N183 Chronic kidney disease, stage 3 (moderate): Secondary | ICD-10-CM | POA: Diagnosis not present

## 2016-03-14 DIAGNOSIS — Z7982 Long term (current) use of aspirin: Secondary | ICD-10-CM | POA: Diagnosis not present

## 2016-03-14 DIAGNOSIS — E1142 Type 2 diabetes mellitus with diabetic polyneuropathy: Secondary | ICD-10-CM | POA: Diagnosis not present

## 2016-03-14 DIAGNOSIS — R269 Unspecified abnormalities of gait and mobility: Secondary | ICD-10-CM | POA: Diagnosis not present

## 2016-03-14 DIAGNOSIS — I129 Hypertensive chronic kidney disease with stage 1 through stage 4 chronic kidney disease, or unspecified chronic kidney disease: Secondary | ICD-10-CM | POA: Diagnosis not present

## 2016-03-14 DIAGNOSIS — G4733 Obstructive sleep apnea (adult) (pediatric): Secondary | ICD-10-CM | POA: Diagnosis not present

## 2016-03-16 ENCOUNTER — Other Ambulatory Visit: Payer: Self-pay | Admitting: Family Medicine

## 2016-03-16 DIAGNOSIS — Z7982 Long term (current) use of aspirin: Secondary | ICD-10-CM | POA: Diagnosis not present

## 2016-03-16 DIAGNOSIS — Z8673 Personal history of transient ischemic attack (TIA), and cerebral infarction without residual deficits: Secondary | ICD-10-CM

## 2016-03-16 DIAGNOSIS — E1122 Type 2 diabetes mellitus with diabetic chronic kidney disease: Secondary | ICD-10-CM

## 2016-03-16 DIAGNOSIS — E1142 Type 2 diabetes mellitus with diabetic polyneuropathy: Secondary | ICD-10-CM

## 2016-03-16 DIAGNOSIS — R269 Unspecified abnormalities of gait and mobility: Secondary | ICD-10-CM

## 2016-03-16 DIAGNOSIS — I129 Hypertensive chronic kidney disease with stage 1 through stage 4 chronic kidney disease, or unspecified chronic kidney disease: Secondary | ICD-10-CM | POA: Diagnosis not present

## 2016-03-16 DIAGNOSIS — G4733 Obstructive sleep apnea (adult) (pediatric): Secondary | ICD-10-CM | POA: Diagnosis not present

## 2016-03-16 DIAGNOSIS — K219 Gastro-esophageal reflux disease without esophagitis: Secondary | ICD-10-CM | POA: Diagnosis not present

## 2016-03-16 DIAGNOSIS — N183 Chronic kidney disease, stage 3 (moderate): Secondary | ICD-10-CM | POA: Diagnosis not present

## 2016-03-17 ENCOUNTER — Other Ambulatory Visit: Payer: Self-pay | Admitting: Family Medicine

## 2016-03-19 DIAGNOSIS — R0902 Hypoxemia: Secondary | ICD-10-CM | POA: Diagnosis not present

## 2016-03-22 DIAGNOSIS — E1122 Type 2 diabetes mellitus with diabetic chronic kidney disease: Secondary | ICD-10-CM | POA: Diagnosis not present

## 2016-03-22 DIAGNOSIS — E1142 Type 2 diabetes mellitus with diabetic polyneuropathy: Secondary | ICD-10-CM | POA: Diagnosis not present

## 2016-03-22 DIAGNOSIS — N183 Chronic kidney disease, stage 3 (moderate): Secondary | ICD-10-CM | POA: Diagnosis not present

## 2016-03-22 DIAGNOSIS — Z8673 Personal history of transient ischemic attack (TIA), and cerebral infarction without residual deficits: Secondary | ICD-10-CM | POA: Diagnosis not present

## 2016-03-22 DIAGNOSIS — I129 Hypertensive chronic kidney disease with stage 1 through stage 4 chronic kidney disease, or unspecified chronic kidney disease: Secondary | ICD-10-CM | POA: Diagnosis not present

## 2016-03-22 DIAGNOSIS — R269 Unspecified abnormalities of gait and mobility: Secondary | ICD-10-CM | POA: Diagnosis not present

## 2016-03-22 DIAGNOSIS — K219 Gastro-esophageal reflux disease without esophagitis: Secondary | ICD-10-CM | POA: Diagnosis not present

## 2016-03-22 DIAGNOSIS — Z7982 Long term (current) use of aspirin: Secondary | ICD-10-CM | POA: Diagnosis not present

## 2016-03-22 DIAGNOSIS — G4733 Obstructive sleep apnea (adult) (pediatric): Secondary | ICD-10-CM | POA: Diagnosis not present

## 2016-03-23 ENCOUNTER — Other Ambulatory Visit: Payer: Self-pay | Admitting: Family Medicine

## 2016-03-23 ENCOUNTER — Other Ambulatory Visit: Payer: Self-pay

## 2016-03-23 MED ORDER — PANTOPRAZOLE SODIUM 40 MG PO TBEC: 40.0000 mg | DELAYED_RELEASE_TABLET | Freq: Two times a day (BID) | ORAL | 0 refills | Status: DC

## 2016-03-28 DIAGNOSIS — Z7982 Long term (current) use of aspirin: Secondary | ICD-10-CM | POA: Diagnosis not present

## 2016-03-28 DIAGNOSIS — Z8673 Personal history of transient ischemic attack (TIA), and cerebral infarction without residual deficits: Secondary | ICD-10-CM | POA: Diagnosis not present

## 2016-03-28 DIAGNOSIS — K219 Gastro-esophageal reflux disease without esophagitis: Secondary | ICD-10-CM | POA: Diagnosis not present

## 2016-03-28 DIAGNOSIS — R269 Unspecified abnormalities of gait and mobility: Secondary | ICD-10-CM | POA: Diagnosis not present

## 2016-03-28 DIAGNOSIS — I129 Hypertensive chronic kidney disease with stage 1 through stage 4 chronic kidney disease, or unspecified chronic kidney disease: Secondary | ICD-10-CM | POA: Diagnosis not present

## 2016-03-28 DIAGNOSIS — E1142 Type 2 diabetes mellitus with diabetic polyneuropathy: Secondary | ICD-10-CM | POA: Diagnosis not present

## 2016-03-28 DIAGNOSIS — N183 Chronic kidney disease, stage 3 (moderate): Secondary | ICD-10-CM | POA: Diagnosis not present

## 2016-03-28 DIAGNOSIS — E1122 Type 2 diabetes mellitus with diabetic chronic kidney disease: Secondary | ICD-10-CM | POA: Diagnosis not present

## 2016-03-28 DIAGNOSIS — G4733 Obstructive sleep apnea (adult) (pediatric): Secondary | ICD-10-CM | POA: Diagnosis not present

## 2016-03-29 ENCOUNTER — Other Ambulatory Visit: Payer: Self-pay | Admitting: Family Medicine

## 2016-03-29 DIAGNOSIS — Z1231 Encounter for screening mammogram for malignant neoplasm of breast: Secondary | ICD-10-CM

## 2016-03-30 DIAGNOSIS — E1121 Type 2 diabetes mellitus with diabetic nephropathy: Secondary | ICD-10-CM | POA: Diagnosis not present

## 2016-03-30 DIAGNOSIS — E559 Vitamin D deficiency, unspecified: Secondary | ICD-10-CM | POA: Diagnosis not present

## 2016-03-30 DIAGNOSIS — E785 Hyperlipidemia, unspecified: Secondary | ICD-10-CM | POA: Diagnosis not present

## 2016-03-31 LAB — COMPLETE METABOLIC PANEL WITH GFR
ALT: 11 U/L (ref 6–29)
AST: 13 U/L (ref 10–35)
Albumin: 4 g/dL (ref 3.6–5.1)
Alkaline Phosphatase: 78 U/L (ref 33–130)
BUN: 14 mg/dL (ref 7–25)
CO2: 25 mmol/L (ref 20–31)
Calcium: 9.5 mg/dL (ref 8.6–10.4)
Chloride: 104 mmol/L (ref 98–110)
Creat: 1.46 mg/dL — ABNORMAL HIGH (ref 0.60–0.93)
GFR, Est African American: 41 mL/min — ABNORMAL LOW (ref >=60)
GFR, Est Non African American: 36 mL/min — ABNORMAL LOW (ref >=60)
Glucose, Bld: 97 mg/dL (ref 65–99)
Potassium: 4.5 mmol/L (ref 3.5–5.3)
Sodium: 139 mmol/L (ref 135–146)
Total Bilirubin: 0.3 mg/dL (ref 0.2–1.2)
Total Protein: 8 g/dL (ref 6.1–8.1)

## 2016-03-31 LAB — HEMOGLOBIN A1C
Hgb A1c MFr Bld: 6.4 % — ABNORMAL HIGH (ref <5.7)
Mean Plasma Glucose: 137 mg/dL

## 2016-03-31 LAB — LIPID PANEL
Cholesterol: 196 mg/dL (ref 125–200)
HDL: 54 mg/dL (ref >=46)
LDL Cholesterol: 118 mg/dL (ref <130)
Total CHOL/HDL Ratio: 3.6 Ratio (ref <=5.0)
Triglycerides: 118 mg/dL (ref <150)
VLDL: 24 mg/dL (ref <30)

## 2016-03-31 LAB — VITAMIN D 25 HYDROXY (VIT D DEFICIENCY, FRACTURES): Vit D, 25-Hydroxy: 21 ng/mL — ABNORMAL LOW (ref 30–100)

## 2016-04-01 ENCOUNTER — Other Ambulatory Visit: Payer: Self-pay | Admitting: Family Medicine

## 2016-04-03 ENCOUNTER — Ambulatory Visit (INDEPENDENT_AMBULATORY_CARE_PROVIDER_SITE_OTHER): Payer: Medicare Other

## 2016-04-03 VITALS — BP 142/86 | HR 86 | Resp 18 | Ht 66.0 in | Wt 199.1 lb

## 2016-04-03 DIAGNOSIS — Z0000 Encounter for general adult medical examination without abnormal findings: Secondary | ICD-10-CM

## 2016-04-03 NOTE — Patient Instructions (Signed)
Thank you for choosing [facility] for your health care needs  The Annual Wellness Visit is designed to allow [place] the chance to assist you in preserving and improving you health.   Dr. Moshe Cipro will see you back in 4 months  Your lab order will be mailed to you with the date to have it done.   If you have any questions please call the office for a sooner appointment.

## 2016-04-04 NOTE — Progress Notes (Signed)
Subjective:    Cheryl Abbott is a 70 y.o. female who presents for Medicare Annual/Subsequent preventive examination.  Preventive Screening-Counseling & Management  Tobacco History  Smoking Status  . Former Smoker  . Packs/day: 1.00  . Years: 20.00  . Types: Cigarettes  . Quit date: 09/21/1994  Smokeless Tobacco  . Never Used    Comment: Quit x 20 years      Current Problems (verified) Patient Active Problem List   Diagnosis Date Noted  . TIA (transient ischemic attack) 02/26/2016  . Lactose intolerance in adult 02/01/2016  . Brain TIA 08/19/2015  . Fall 08/19/2015  . Generalized weakness 08/19/2015  . CKD (chronic kidney disease) stage 3, GFR 30-59 ml/min 07/12/2015  . Constipation 05/19/2015  . Lipoma of arm 01/19/2015  . Back pain   . Allergic cough 12/07/2014  . Anemia 10/14/2014  . Encounter for examination following treatment at hospital 05/11/2014  . Lumbar back pain with radiculopathy affecting left lower extremity 05/11/2014  . Nocturnal hypoxia 02/04/2014  . Generalized anxiety disorder 05/20/2013  . Dysphagia, idiopathic 03/04/2013  . Depression 08/13/2012  . Abnormal brain scan 02/07/2012  . Thoracic or lumbosacral neuritis or radiculitis, unspecified 12/22/2010  . Hypercalcemia 11/30/2010  . Colon adenomas 05/11/2009  . HEMORRHOIDS 03/15/2009  . GERD 03/15/2009  . FATTY LIVER DISEASE 03/15/2009  . RENAL CALCULUS 03/15/2009  . Sleep apnea 09/15/2008  . Type 2 diabetes with nephropathy ([place]) 02/02/2008  . Hyperlipidemia LDL goal <100 09/17/2007  . Hypertension goal BP (blood pressure) < 130/80 09/17/2007    Medications Prior to Visit Current Outpatient Prescriptions on File Prior to Visit  Medication Sig Dispense Refill  . ALPRAZolam (XANAX) 0.5 MG tablet TAKE 1 TABLET BY MOUTH TWICE A DAY 60 tablet 0  . amLODipine (NORVASC) 10 MG tablet Take 1 tablet (10 mg total) by mouth daily. 90 tablet 0  . aspirin EC 325 MG tablet Take 1 tablet (325 mg  total) by mouth daily. 60 tablet 0  . clopidogrel (PLAVIX) 75 MG tablet Take 75 mg by mouth daily.    Marland Kitchen docusate sodium (COLACE) 100 MG capsule Take 200 mg by mouth at bedtime.     . lovastatin (MEVACOR) 40 MG tablet Take 80 mg by mouth at bedtime.    . pantoprazole (PROTONIX) 40 MG tablet Take 1 tablet (40 mg total) by mouth 2 (two) times daily. 180 tablet 0  . PARoxetine (PAXIL) 40 MG tablet TAKE 1 TABLET (40 MG TOTAL) BY MOUTH EVERY MORNING. 90 tablet 0  . potassium chloride (K-DUR) 10 MEQ tablet Take 10 mEq by mouth 2 (two) times daily.     . Vitamin D, Ergocalciferol, (DRISDOL) 50000 units CAPS capsule Take 50,000 Units by mouth every 7 (seven) days.     No current facility-administered medications on file prior to visit.     Current Medications (verified) Current Outpatient Prescriptions  Medication Sig Dispense Refill  . ALPRAZolam (XANAX) 0.5 MG tablet TAKE 1 TABLET BY MOUTH TWICE A DAY 60 tablet 0  . amLODipine (NORVASC) 10 MG tablet Take 1 tablet (10 mg total) by mouth daily. 90 tablet 0  . aspirin EC 325 MG tablet Take 1 tablet (325 mg total) by mouth daily. 60 tablet 0  . clopidogrel (PLAVIX) 75 MG tablet Take 75 mg by mouth daily.    Marland Kitchen docusate sodium (COLACE) 100 MG capsule Take 200 mg by mouth at bedtime.     . lovastatin (MEVACOR) 40 MG tablet Take 80 mg by mouth  at bedtime.    . pantoprazole (PROTONIX) 40 MG tablet Take 1 tablet (40 mg total) by mouth 2 (two) times daily. 180 tablet 0  . PARoxetine (PAXIL) 40 MG tablet TAKE 1 TABLET (40 MG TOTAL) BY MOUTH EVERY MORNING. 90 tablet 0  . potassium chloride (K-DUR) 10 MEQ tablet Take 10 mEq by mouth 2 (two) times daily.     . Vitamin D, Ergocalciferol, (DRISDOL) 50000 units CAPS capsule Take 50,000 Units by mouth every 7 (seven) days.     No current facility-administered medications for this visit.      Allergies (verified) Ace inhibitors; Keflex [cephalexin]; Nitrofurantoin; and Penicillins   PAST HISTORY  Family  History Family History  Problem Relation Age of Onset  . Hypertension Mother   . Diabetes Mother   . Heart failure Mother   . Dementia Mother   . Emphysema Father   . Hypertension Father   . Diabetes Brother   . GER disease Brother   . Hypertension Sister   . Hypertension Sister   . Cancer      family history   . Diabetes      family history   . Heart defect      famiily history   . Arthritis      family history   . Anesthesia problems Neg Hx   . Hypotension Neg Hx   . Malignant hyperthermia Neg Hx   . Pseudochol deficiency Neg Hx   . Colon cancer Neg Hx     Social History Social History  Substance Use Topics  . Smoking status: Former Smoker    Packs/day: 1.00    Years: 20.00    Types: Cigarettes    Quit date: 09/21/1994  . Smokeless tobacco: Never Used     Comment: Quit x 20 years  . Alcohol use No     Are there smokers in your home (other than you)? No  Risk Factors Current exercise habits: The patient does not participate in regular exercise at present.  Dietary issues discussed: food portions, cholesterol lowering foods    Cardiac risk factors: advanced age (older than 43 for men, 56 for women), dyslipidemia, hypertension and sedentary lifestyle.  Depression Screen (Note: if answer to either of the following is "Yes", a more complete depression screening is indicated)   Over the past two weeks, have you felt down, depressed or hopeless? No  Over the past two weeks, have you felt little interest or pleasure in doing things? No  Have you lost interest or pleasure in daily life? No  Do you often feel hopeless? No  Do you cry easily over simple problems? No  Activities of Daily Living In your present state of health, do you have any difficulty performing the following activities?:  Driving? Yes likes to have someone else in the car with her.  Managing money?  No Feeding yourself? No Getting from bed to chair? No  Climbing a flight of stairs?  Yes Preparing food and eating?: No Bathing or showering? No Getting dressed: No Getting to the toilet? No Using the toilet:No Moving around from place to place: No In the past year have you fallen or had a near fall?:Yes   Are you sexually active?  No  Do you have more than one partner?  na  Hearing Difficulties: No Do you often ask people to speak up or repeat themselves? No Do you experience ringing or noises in your ears? No Do you have difficulty understanding soft or whispered  voices? No   Do you feel that you have a problem with memory? Yes  Do you often misplace items? Yes  Do you feel safe at home?  Yes  Cognitive Testing  Alert? Yes  Normal Appearance?Yes  Oriented to person? Yes  Place? Yes   Time? Yes  Recall of three objects?  Yes  Can perform simple calculations? Yes  Displays appropriate judgment?Yes  Can read the correct time from a watch face?Yes   Advanced Directives have been discussed with the patient? Yes  List the Names of Other Physician/Practitioners you currently use: 1.  Dr. Gershon Crane (opthamology)  2.  Dr. Diona Fanti (urology)   Indicate any recent Medical Services you may have received from other than [facility] providers in the past year (date may be approximate).  Immunization History  Administered Date(s) Administered  . Influenza Split 03/17/2015  . Influenza Whole 04/15/2007, 03/17/2009  . Influenza,inj,Quad PF,36+ Mos 02/04/2014  . Pneumococcal Conjugate-13 12/21/2013  . Pneumococcal Polysaccharide-23 01/19/2004, 05/23/2010  . Td 06/07/2003  . Tdap 06/14/2011  . Zoster 02/02/2008    Screening Tests Health Maintenance  Topic Date Due  . OPHTHALMOLOGY EXAM  07/27/2015  . URINE MICROALBUMIN  12/07/2015  . HEMOGLOBIN A1C  09/28/2016  . FOOT EXAM  02/23/2017  . MAMMOGRAM  04/19/2017  . TETANUS/TDAP  06/13/2021  . COLONOSCOPY  10/01/2021  . INFLUENZA VACCINE  Completed  . DEXA SCAN  Completed  . ZOSTAVAX  Completed  . Hepatitis C  Screening  Completed  . PNA vac Low Risk Adult  Completed    All answers were reviewed with the patient and necessary referrals were made:  Vanetta Mulders, LPN   [id number]/19/6222   History reviewed: allergies, current medications, past family history, past medical history, past social history, past surgical history and problem list  Review of Systems A comprehensive review of systems was negative.    Objective:     Vision by Snellen chart: right eye:20/20, left eye:20/20  Body mass index is 32.13 kg/m. BP (!) 142/86   Pulse 86   Resp 18   Ht 5\' 6"  (1.676 m)   Wt 199 lb 1.3 oz (90.3 kg)   SpO2 96%   BMI 32.13 kg/m   No exam performed today, annual wellness without physical exam.     Assessment:     Encounter for Medicare annual wellness exam Annual exam as documented. Counseling done  re healthy lifestyle involving commitment to 150 minutes exercise per week, heart healthy diet, and attaining healthy weight.The importance of adequate sleep also discussed. Regular seat belt use and home safety, is also discussed. Changes in health habits are decided on by the patient with goals and time frames  set for achieving them. Immunization and cancer screening needs are specifically addressed at this visit.      Plan:     During the course of the visit the patient was educated and counseled about appropriate screening and preventive services including:    Nutrition counseling   Diet review for nutrition referral? Yes ____  Not Indicated _x___   Patient Instructions (the written plan) was given to the patient.  Medicare Attestation I have personally reviewed: The patient's medical and social history Their use of alcohol, tobacco or illicit drugs Their current medications and supplements The patient's functional ability including ADLs,fall risks, home safety risks, cognitive, and hearing and visual impairment Diet and physical activities Evidence for depression  or mood disorders  The patient's weight, height, BMI, and visual acuity  have been recorded in the chart.  I have made referrals, counseling, and provided education to the patient based on review of the above and I have provided the patient with a written personalized care plan for preventive services.     Denman George [place], [place]   27/74/1287

## 2016-04-05 ENCOUNTER — Telehealth: Payer: Self-pay | Admitting: Family Medicine

## 2016-04-05 NOTE — Assessment & Plan Note (Signed)

## 2016-04-05 NOTE — Telephone Encounter (Signed)
pls order cBC, microalb, hBA1C, chem 7 and eGFR, Vit D for 4 month visit. Needs to take daily Vit D3 1000 IU if not taking the weekly script which is often more costly

## 2016-04-11 ENCOUNTER — Encounter: Payer: Self-pay | Admitting: Family Medicine

## 2016-04-11 ENCOUNTER — Ambulatory Visit (INDEPENDENT_AMBULATORY_CARE_PROVIDER_SITE_OTHER): Payer: Medicare Other | Admitting: Family Medicine

## 2016-04-11 VITALS — BP 124/82 | HR 96 | Ht 66.0 in | Wt 199.0 lb

## 2016-04-11 DIAGNOSIS — I10 Essential (primary) hypertension: Secondary | ICD-10-CM | POA: Diagnosis not present

## 2016-04-11 DIAGNOSIS — L298 Other pruritus: Secondary | ICD-10-CM

## 2016-04-11 DIAGNOSIS — R0789 Other chest pain: Secondary | ICD-10-CM

## 2016-04-11 DIAGNOSIS — E785 Hyperlipidemia, unspecified: Secondary | ICD-10-CM | POA: Diagnosis not present

## 2016-04-11 DIAGNOSIS — E1121 Type 2 diabetes mellitus with diabetic nephropathy: Secondary | ICD-10-CM

## 2016-04-11 DIAGNOSIS — G629 Polyneuropathy, unspecified: Secondary | ICD-10-CM

## 2016-04-11 DIAGNOSIS — F411 Generalized anxiety disorder: Secondary | ICD-10-CM

## 2016-04-11 DIAGNOSIS — G5793 Unspecified mononeuropathy of bilateral lower limbs: Secondary | ICD-10-CM

## 2016-04-11 HISTORY — DX: Unspecified mononeuropathy of bilateral lower limbs: G57.93

## 2016-04-11 MED ORDER — HYDROXYZINE HCL 10 MG PO TABS: ORAL_TABLET | ORAL | 3 refills | Status: DC

## 2016-04-11 MED ORDER — BETAMETHASONE DIPROPIONATE 0.05 % EX CREA: TOPICAL_CREAM | CUTANEOUS | 0 refills | Status: DC

## 2016-04-11 NOTE — Patient Instructions (Addendum)
F/u in Feb as before, call if you need me sooner  Hydroxyzine tablet and steroid cream sent for rash and for itching  No antibiotic needed, not infected  You are being referred to cardiology in [place], Dr Harl Bowie due to c/o chest pain and shortness of breath with activity in the past month  Fasting labs in February , 1 week before visit

## 2016-04-11 NOTE — Telephone Encounter (Signed)
Pt coming in for appt today °

## 2016-04-15 ENCOUNTER — Encounter: Payer: Self-pay | Admitting: Family Medicine

## 2016-04-15 DIAGNOSIS — L298 Other pruritus: Secondary | ICD-10-CM | POA: Insufficient documentation

## 2016-04-15 NOTE — Assessment & Plan Note (Addendum)
Controlled, no change in management Cheryl Abbott is reminded of the importance of commitment to daily physical activity for 30 minutes or more, as able and the need to limit carbohydrate intake to 30 to 60 grams per meal to help with blood sugar control.   The need to take medication as prescribed, test blood sugar as directed, and to call between visits if there is a concern that blood sugar is uncontrolled is also discussed.   Cheryl Abbott is reminded of the importance of daily foot exam, annual eye examination, and good blood sugar, blood pressure and cholesterol control.  Diabetic Labs Latest Ref Rng & Units 03/30/2016 03/07/2016 02/27/2016 02/26/2016 02/25/2016  HbA1c <5.7 % 6.4(H) - - 6.7(H) -  Microalbumin <2.0 mg/dL - - - - -  Micro/Creat Ratio 0.0 - 30.0 mg/g - - - - -  Chol 125 - 200 mg/dL 196 - - 175 -  HDL >=46 mg/dL 54 - - 40(L) -  Calc LDL <130 mg/dL 118 - - 105(H) -  Triglycerides <150 mg/dL 118 - - 148 -  Creatinine 0.60 - 0.93 mg/dL 1.46(H) 1.46(H) 1.52(H) 1.52(H) 1.60(H)   BP/Weight 04/11/2016 04/03/2016 03/07/2016 03/06/2016 02/27/2016 02/26/2016 1/53/7943  Systolic BP 276 147 092 957 473 - 403  Diastolic BP 82 86 78 84 65 - 84  Wt. (Lbs) 199 199.08 195 195.12 - 196.21 195  BMI 32.12 32.13 31.47 31.49 - 31.67 31.47   Foot/eye exam completion dates Latest Ref Rng & Units 02/24/2016 07/27/2015  Eye Exam No Retinopathy - No Retinopathy  Foot exam Order - - -  Foot Form Completion - Done -

## 2016-04-15 NOTE — Assessment & Plan Note (Signed)
Controlled, no change in medication DASH diet and commitment to daily physical activity for a minimum of 30 minutes discussed and encouraged, as a part of hypertension management. The importance of attaining a healthy weight is also discussed.  BP/Weight 04/11/2016 04/03/2016 03/07/2016 03/06/2016 02/27/2016 02/26/2016 0/44/7158  Systolic BP 063 868 548 830 141 - 597  Diastolic BP 82 86 78 84 65 - 84  Wt. (Lbs) 199 199.08 195 195.12 - 196.21 195  BMI 32.12 32.13 31.47 31.49 - 31.67 31.47

## 2016-04-15 NOTE — Assessment & Plan Note (Signed)
hyperpigmeted macular rash in both lower extremities, hydroxyzine at bedtime and topical steroid to area

## 2016-04-15 NOTE — Assessment & Plan Note (Signed)
Hyperlipidemia:Low fat diet discussed and encouraged.   Lipid Panel  Lab Results  Component Value Date   CHOL 196 03/30/2016   HDL 54 03/30/2016   LDLCALC 118 03/30/2016   TRIG 118 03/30/2016   CHOLHDL 3.6 03/30/2016   Uncontrolled, dietary change only, nomed change at this time Updated lab needed at/ before next visit.

## 2016-04-15 NOTE — Assessment & Plan Note (Signed)
Uncontrolled , start gabapentin at bedtime

## 2016-04-15 NOTE — Assessment & Plan Note (Signed)
1 month h/o  Intermittent chest pain , no specifica aggravating factors noted , also exertional fatigue. Established high risk  vascular diease based on co morbidities, refer to cardiology for further eval

## 2016-04-15 NOTE — Assessment & Plan Note (Signed)
Controlled, no change in medication  

## 2016-04-15 NOTE — Progress Notes (Signed)
Cheryl Abbott     MRN: [id number]      DOB: Dec 28, 1945   HPI Ms. First is here wit a 1 month h/o intermittent chest discomfort, with and without activity, no aggravating or relieving factors noted, and also new exertional fatigue x 1 month. Multiple CV risk factors, needs cardiology re eval  1 month h/o pruritic rash on both legs no purulent drainage , fever or chills, no known trauma  ROS Denies recent fever or chills. Denies sinus pressure, nasal congestion, ear pain or sore throat. Denies chest congestion, productive cough or wheezing. Denies chest pains, palpitations and leg swelling Denies abdominal pain, nausea, vomiting,diarrhea or constipation.   Denies dysuria, frequency, hesitancy or incontinence.  Denies depression, anxiety or insomnia.    PE  BP 124/82   Pulse 96   Ht 5\' 6"  (1.676 m)   Wt 199 lb (90.3 kg)   SpO2 94%   BMI 32.12 kg/m   Patient alert and oriented and in no cardiopulmonary distress.  HEENT: No facial asymmetry, EOMI,   oropharynx pink and moist.  Neck supple no JVD, no mass.  Chest: Clear to auscultation bilaterally.No r[producible chest pain  CVS: S1, S2 no murmurs, no S3.Regular rate.  ABD: Soft non tender.   Ext: No edema  MS: Adequate ROM spine, shoulders, hips and knees.  Skin: hyperpigmented macular rash on both lower extremity  Psych: Good eye contact, normal affect. Memory intact not anxious or depressed appearing.  CNS: CN 2-12 intact, power,  normal throughout.no focal deficits noted.   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   The need to take medication as prescribed, test blood sugar as directed, and to call between visits if there is a concern that blood sugar is uncontrolled is also discussed.   Cheryl Abbott is reminded of the importance of daily foot exam, annual eye examination, and good blood sugar, blood pressure and cholesterol control.  Diabetic Labs Latest Ref Rng & Units 03/30/2016 03/07/2016 02/27/2016 02/26/2016 02/25/2016  HbA1c <5.7 % 6.4(H) - - 6.7(H) -  Microalbumin <2.0 mg/dL - - - - -  Micro/Creat Ratio 0.0 - 30.0 mg/g - - - - -  Chol 125 - 200 mg/dL 196 - - 175 -  HDL >=46 mg/dL 54 - - 40(L) -  Calc LDL <130 mg/dL 118 - - 105(H) -  Triglycerides <150 mg/dL 118 - - 148 -  Creatinine 0.60 - 0.93 mg/dL 1.46(H) 1.46(H) 1.52(H) 1.52(H) 1.60(H)   BP/Weight 04/11/2016 04/03/2016 03/07/2016 03/06/2016 02/27/2016 02/26/2016 8/58/8502  Systolic BP 774 128 786 767 209 - 470  Diastolic BP 82 86 78 84 65 - 84  Wt. (Lbs) 199 199.08 195 195.12 - 196.21 195  BMI 32.12 32.13 31.47 31.49 - 31.67 31.47   Foot/eye exam completion dates Latest Ref Rng & Units 02/24/2016 07/27/2015  Eye Exam No Retinopathy - No Retinopathy  Foot exam Order - - -  Foot Form Completion - Done -        Hypertension goal  BP (blood pressure) < 130/80 Controlled, no change in medication DASH diet and commitment to daily physical activity for a minimum of 30 minutes discussed and encouraged, as a part of hypertension management. The importance of attaining a healthy weight is also discussed.  BP/Weight 04/11/2016 04/03/2016 03/07/2016 03/06/2016 02/27/2016 02/26/2016 0/35/5974  Systolic BP 163 845 364 680 321 - 224  Diastolic BP 82 86 78 84 65 - 84  Wt. (Lbs) 199 199.08 195 195.12 - 196.21 195  BMI 32.12 32.13 31.47 31.49 - 31.67 31.47       Hyperlipidemia LDL goal <100 Hyperlipidemia:Low fat diet discussed and encouraged.   Lipid  Panel  Lab Results  Component Value Date   CHOL 196 03/30/2016   HDL 54 03/30/2016   LDLCALC 118 03/30/2016   TRIG 118 03/30/2016   CHOLHDL 3.6 03/30/2016   Uncontrolled, dietary change only, nomed change at this time Updated lab needed at/ before next visit.     Chest pain 1 month h/o  Intermittent chest pain , no specifica aggravating factors noted , also exertional fatigue. Established high risk  vascular diease based on co morbidities, refer to cardiology for further eval  Generalized anxiety disorder Controlled, no change in medication

## 2016-04-18 ENCOUNTER — Encounter: Payer: Self-pay | Admitting: Cardiovascular Disease

## 2016-04-19 ENCOUNTER — Other Ambulatory Visit: Payer: Self-pay | Admitting: Family Medicine

## 2016-04-19 DIAGNOSIS — R0902 Hypoxemia: Secondary | ICD-10-CM | POA: Diagnosis not present

## 2016-04-20 ENCOUNTER — Ambulatory Visit (HOSPITAL_COMMUNITY)
Admission: RE | Admit: 2016-04-20 | Discharge: 2016-04-20 | Disposition: A | Discharge status: Home / Self Care | Payer: Medicare Other | Source: Ambulatory Visit | Attending: Family Medicine | Admitting: Family Medicine

## 2016-04-20 DIAGNOSIS — Z1231 Encounter for screening mammogram for malignant neoplasm of breast: Secondary | ICD-10-CM | POA: Diagnosis present

## 2016-05-07 IMAGING — CR DG CHEST 2V
2 series · 2 of 2 positions shown · non-contrast
Comparison: Portable chest x-ray October 25, 2012.

CLINICAL DATA: Chest pain on exertion

EXAM:
CHEST  2 VIEW

[view not recorded (1 of 2)]
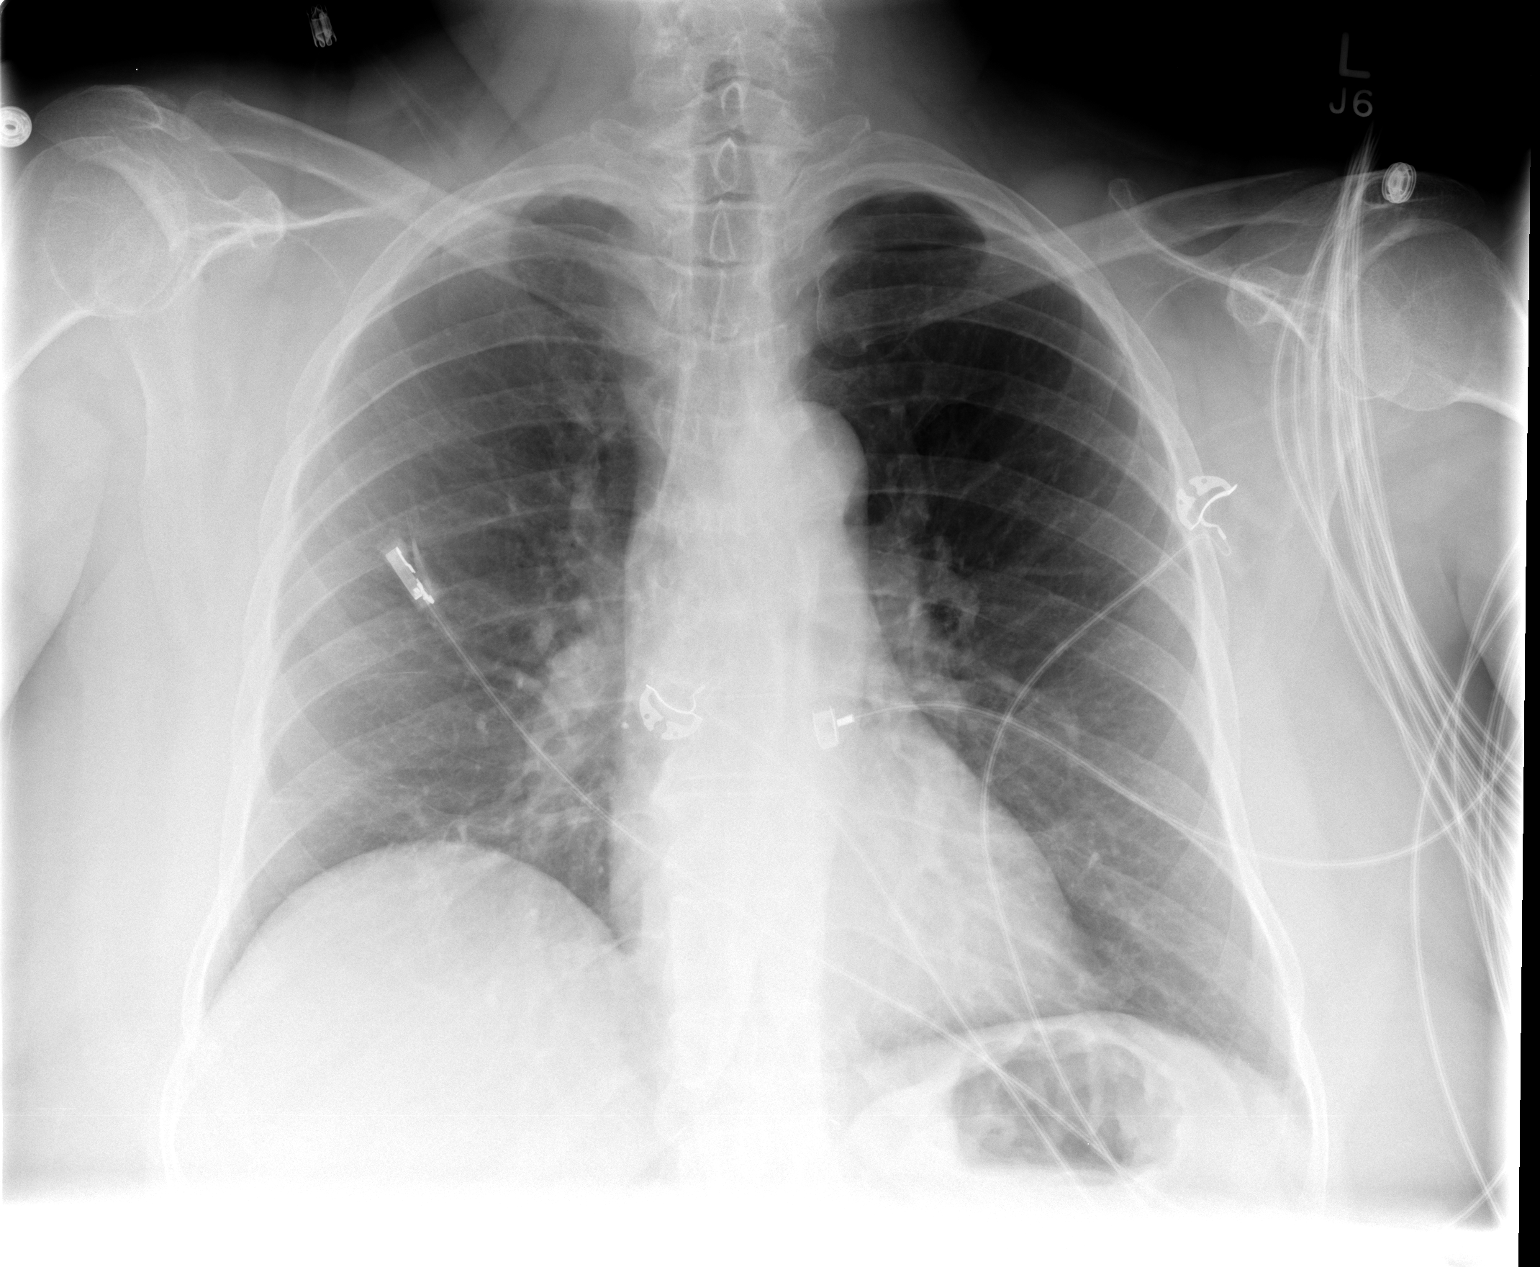

[view not recorded (2 of 2)]
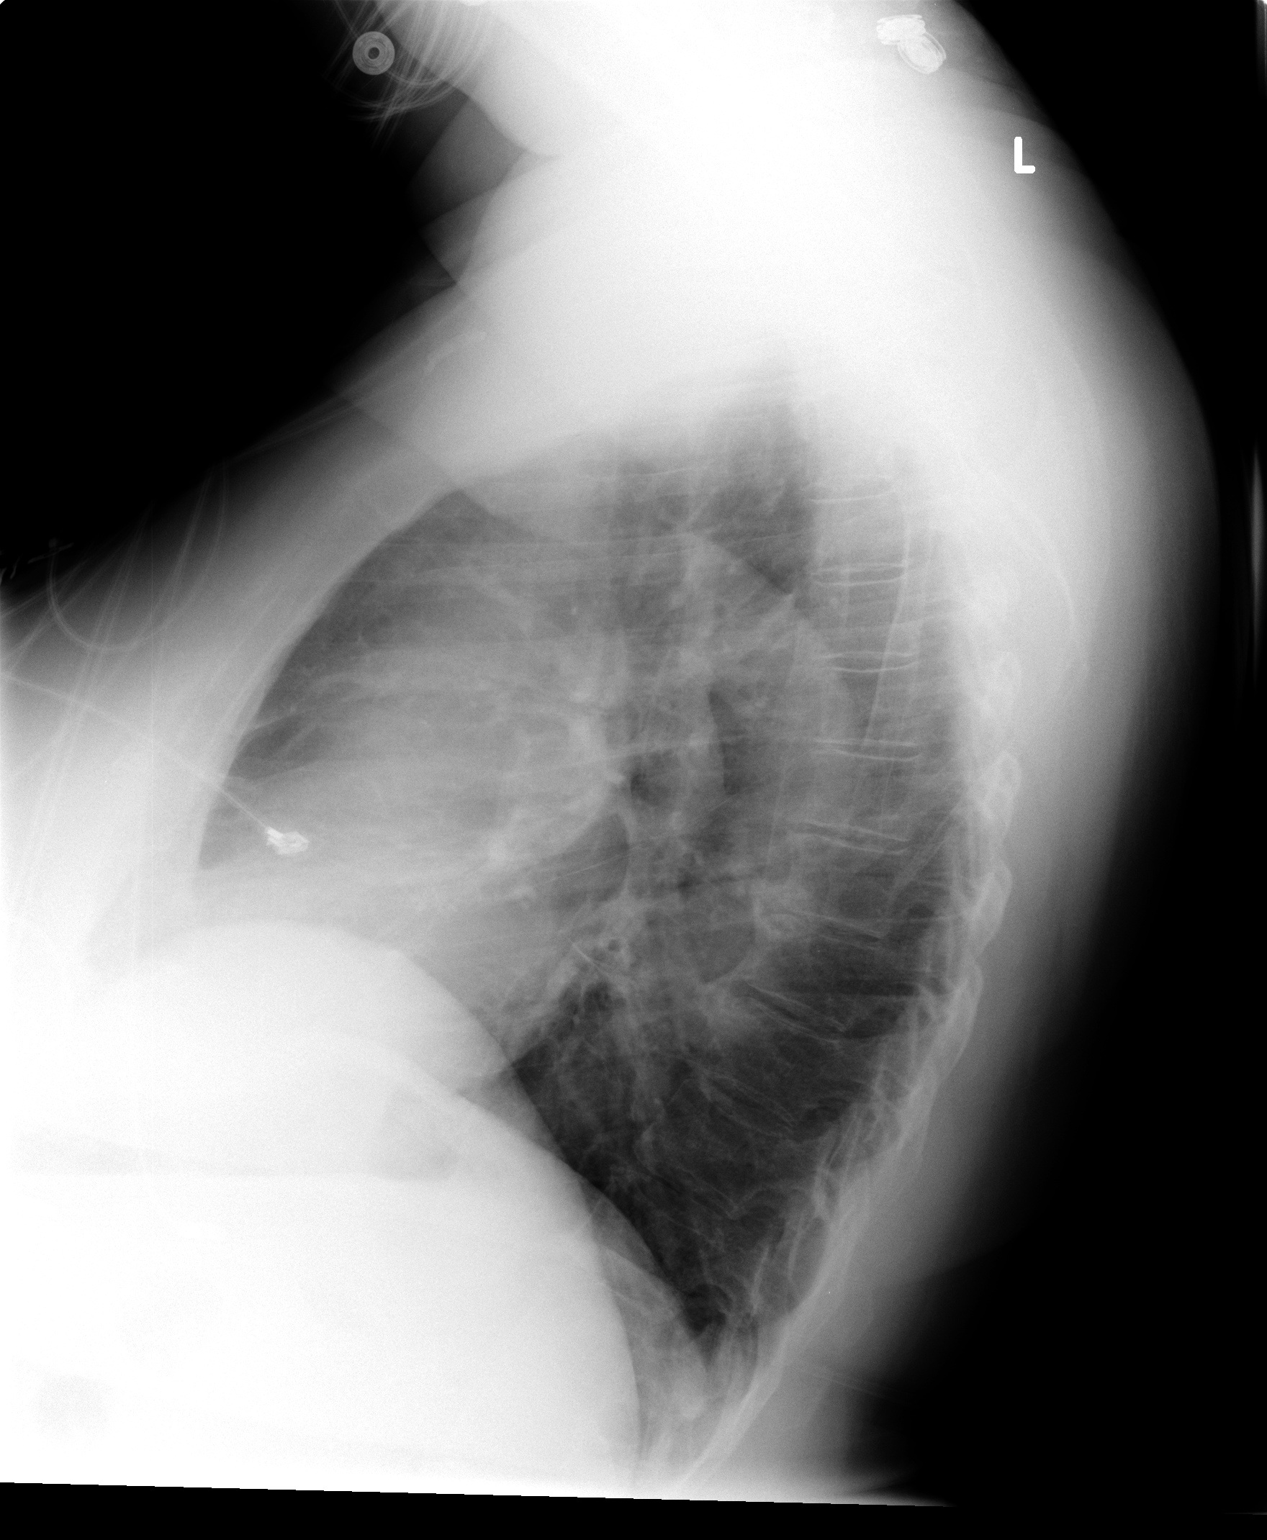

[2 of 2 positions shown; findings below may reference images not displayed]

FINDINGS: The lungs are adequately inflated. There is chronic mild elevation
of the right hemidiaphragm. The heart and pulmonary vascularity are
normal. There is no pleural effusion. The thoracic spine exhibits
mild degenerative change.
IMPRESSION: There is no acute cardiopulmonary abnormality.

## 2016-05-10 DIAGNOSIS — L281 Prurigo nodularis: Secondary | ICD-10-CM | POA: Diagnosis not present

## 2016-05-19 DIAGNOSIS — R0902 Hypoxemia: Secondary | ICD-10-CM | POA: Diagnosis not present

## 2016-05-25 ENCOUNTER — Encounter: Payer: Self-pay | Admitting: *Deleted

## 2016-05-28 ENCOUNTER — Encounter: Payer: Self-pay | Admitting: Cardiology

## 2016-05-28 ENCOUNTER — Ambulatory Visit (INDEPENDENT_AMBULATORY_CARE_PROVIDER_SITE_OTHER): Payer: Medicare Other | Admitting: Cardiology

## 2016-05-28 VITALS — BP 120/79 | HR 89 | Ht 66.0 in | Wt 205.0 lb

## 2016-05-28 DIAGNOSIS — R0602 Shortness of breath: Secondary | ICD-10-CM

## 2016-05-28 NOTE — Progress Notes (Signed)
Clinical Summary Ms. Roughton is a 70 y.o.female seen as a new consult, she is referred by Dr Moshe Cipro   1. Chest pain - echo 02/2016 LVEF 60-65%, grade I diastoilc dysfunction - 12/2013 nuclear stress without ischemia, low risk.   - midchest, dull aching pain. 2/10 in severity. Tends to occur with activity or stress. No associated symptoms. Not positional. Lasts just a few seconds. Occurs twice a week. At time can occur after eating. She is on protonix 40mg  bid with improvement - has had some recent DOE x 1 year.  - former tobacco, quit about 20 years. No cough, no wheezing.  - reports normal cath in past around 2004    Past Medical History:  Diagnosis Date  . Anemia   . Anxiety   . Anxiety disorder   . Arthritis   . 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 Chronic back pain   . Depression   . Fatty liver   . GERD (gastroesophageal reflux disease)   . Headache   . History of adenomatous polyp of colon    2008  . History of kidney stones   . Hypercalcemia   . Hyperlipidemia   . Hypertension   . Left ureteral stone   . Lipoma of arm 01/19/2015  . Lumbar disc disease with radiculopathy   . Nephrolithiasis   . Neuropathy, peripheral ([place])   . OSA treated with BiPAP    per study 2007  . Sciatica   . Sigmoid diverticulosis   . Type 2 diabetes mellitus (HCC)      Allergies  Allergen Reactions  . Ace Inhibitors Cough  . Keflex [Cephalexin] Diarrhea and Nausea And Vomiting  . Nitrofurantoin Diarrhea and Nausea And Vomiting  . Penicillins Hives and Other (See Comments)    Reaction:  Blisters on hands/feet  Has patient had a PCN reaction causing immediate rash, facial/tongue/throat swelling, SOB or lightheadedness with hypotension: No Has patient had a PCN reaction causing severe rash involving mucus membranes or skin necrosis: No Has patient had a PCN reaction that required hospitalization No Has patient had a PCN reaction occurring within the last 10 years:  No If all of the above answers are "NO", then may proceed with Cephalosporin use.     Current Outpatient Prescriptions  Medication Sig Dispense Refill  . ALPRAZolam (XANAX) 0.5 MG tablet TAKE 1 TABLET BY MOUTH TWICE A DAY 60 tablet 1  . amLODipine (NORVASC) 10 MG tablet Take 1 tablet (10 mg total) by mouth daily. 90 tablet 0  . aspirin EC 325 MG tablet Take 1 tablet (325 mg total) by mouth daily. 60 tablet 0  . betamethasone dipropionate (DIPROLENE) 0.05 % cream Apply sparingly twice daily to rash  For 7 to 10 days only 45 g 0  . clopidogrel (PLAVIX) 75 MG tablet Take 75 mg by mouth daily.    Marland Kitchen docusate sodium (COLACE) 100 MG capsule Take 200 mg by mouth at bedtime.     . hydrOXYzine (ATARAX/VISTARIL) 10 MG tablet One tablet at bedtime for itching and burning 30 tablet 3  . ibuprofen (ADVIL,MOTRIN) 800 MG tablet TAKE 1 TABLET BY MOUTH EVERY DAY AS NEEDED (MUST LAST 90 DAYS) 30 tablet 0  . lovastatin (MEVACOR) 40 MG tablet Take 80 mg by mouth at bedtime.    . lovastatin (MEVACOR) 40 MG tablet TAKE 2 TABLETS (80 MG TOTAL) BY MOUTH AT BEDTIME. 180 tablet 0  . pantoprazole (PROTONIX) 40 MG tablet Take 1 tablet (40 mg total)  by mouth 2 (two) times daily. 180 tablet 0  . PARoxetine (PAXIL) 40 MG tablet TAKE 1 TABLET (40 MG TOTAL) BY MOUTH EVERY MORNING. 90 tablet 0  . potassium chloride (K-DUR) 10 MEQ tablet Take 10 mEq by mouth 2 (two) times daily.     . Vitamin D, Ergocalciferol, (DRISDOL) 50000 units CAPS capsule Take 50,000 Units by mouth every 7 (seven) days.     No current facility-administered medications for this visit.      Past Surgical History:  Procedure Laterality Date  . BIOPSY N/A 03/17/2013   Procedure: GASTRIC BIOPSIES;  Surgeon: Danie Binder, MD;  Location: [facility];  Service: Endoscopy;  Laterality: N/A;  . BIOPSY  06/10/2015   Procedure: BIOPSY;  Surgeon: Danie Binder, MD;  [facility];  Service: Endoscopy;;  gastric biopsy  . CARDIAC CATHETERIZATION   05-11-2003  dr Shelva Majestic ([facility])   Abnormal cardiolite/   Normal coronary arteries and normal LVF,  ef  63%  . CARDIOVASCULAR STRESS TEST  01-01-2014   normal lexiscan cardiolite/  no ischemia/ infarct/  normal LV function and wall motion ,  ef 81%  . COLONOSCOPY  last one 10/02/2011   [facility] [place] T[facility], IH, NEXT TCS APR 2018  . CYSTO/   RIGHT URETEROSOCOPY LASER LITHOTRIPSY STONE EXTRACTION  10-13-2004  . CYSTO/  RIGHT RETROGRADE PYELOGRAM/  PLACMENT RIGHT URETERAL STENT  01-10-2010  . CYSTOSCOPY W/ URETERAL STENT PLACEMENT Right 08/19/2015   Procedure: CYSTOSCOPY WITH RETROGRADE PYELOGRAM/URETERAL STENT PLACEMENT;  Surgeon: Franchot Gallo, MD;  Location: [facility];  Service: Urology;  Laterality: Right;  . CYSTOSCOPY WITH RETROGRADE PYELOGRAM, URETEROSCOPY AND STENT PLACEMENT Left 10/21/2014   Procedure: CYSTOSCOPY WITH RETROGRADE PYELOGRAM, URETEROSCOPY AND STENT PLACEMENT;  Surgeon: Franchot Gallo, MD;  Location[facility];  Service: Urology;  Laterality: Left;  . CYSTOSCOPY WITH RETROGRADE PYELOGRAM, URETEROSCOPY AND STENT PLACEMENT Right 11/22/2015   Procedure: CYSTOSCOPY, RIGHT URETERAL STENT REMOVAL; RIGHT RETROGRADE PYELOGRAM, RIGHT URETEROSCOPY WITH BALLOON DILATION; RIGHT URETERAL STENT PLACEMENT;  Surgeon: Franchot Gallo, MD;  Location: [facility];  Service: Urology;  Laterality: Right;  . CYSTOSCOPY/RETROGRADE/URETEROSCOPY/STONE EXTRACTION WITH BASKET Bilateral 08/02/2015   Procedure: CYSTOSCOPY; BILATERAL RETROGRADE PYELOGRAMS; BILATERAL URETEROSCOPY, STONE EXTRACTION WITH BASKET;  Surgeon: Franchot Gallo, MD;  Location: [facility];  Service: Urology;  Laterality: Bilateral;  . CYSTOSCOPY/RETROGRADE/URETEROSCOPY/STONE EXTRACTION WITH BASKET Right 06/28/2015   Procedure: CYSTOSCOPY/RETROGRADE/URETEROSCOPY/STONE EXTRACTION WITH BASKET, RIGHT URETERAL DOUBLE J STENT PLACEMENT;  Surgeon: Franchot Gallo, MD;  Location: [facility];  Service: Urology;  Laterality:  Right;  . ESOPHAGOGASTRODUODENOSCOPY (EGD) WITH PROPOFOL N/A 03/17/2013   Procedure: ESOPHAGOGASTRODUODENOSCOPY (EGD) WITH PROPOFOL;  Surgeon: Danie Binder, MD;  Location: [facility];  Service: Endoscopy;  Laterality: N/A;  . ESOPHAGOGASTRODUODENOSCOPY (EGD) WITH PROPOFOL N/A 06/10/2015   Procedure: ESOPHAGOGASTRODUODENOSCOPY (EGD) WITH PROPOFOL;  Surgeon: Danie Binder, MD;  [facility];  Service: Endoscopy;  Laterality: N/A;  1245 - pt knows to arrive at 8:30  . FLEXIBLE SIGMOIDOSCOPY  09/11/2011   UUE:[id number] Internal hemorrhoids  . HOLMIUM LASER APPLICATION Left 7/91/5056   Procedure: HOLMIUM LASER APPLICATION;  Surgeon: Franchot Gallo, MD;  Location[facility];  Service: Urology;  Laterality: Left;  . HOLMIUM LASER APPLICATION Right 9/79/4801   Procedure: HOLMIUM LASER APPLICATION;  Surgeon: Franchot Gallo, MD;  Location: [facility];  Service: Urology;  Laterality: Right;  . MASS EXCISION Left 03/04/2015   Procedure: EXCISION OF SOFT TISSUE NEOPLASM LEFT ARM;  Surgeon: Aviva Signs, MD;  Location: [facility];  Service: General;  Laterality: Left;  . PERCUTANEOUS NEPHROSTOLITHOTOMY Bilateral 12/  2011     [facility]  . POLYPECTOMY N/A 03/17/2013   Procedure: GASTRIC POLYPECTOMY;  Surgeon: Danie Binder, MD;  Location: [facility];  Service: Endoscopy;  Laterality: N/A;  . REMOVAL RIGHT THIGH CYST  2006  . SAVORY DILATION N/A 03/17/2013   Procedure: SAVORY DILATION;  Surgeon: Danie Binder, MD;  Location: [facility];  Service: Endoscopy;  Laterality: N/A;  #12.8, 14, 15, 16 dilators used  . SAVORY DILATION N/A 06/10/2015   Procedure: SAVORY DILATION;  Surgeon: Danie Binder, MD;  [facility];  Service: Endoscopy;  Laterality: N/A;  . TRANSTHORACIC ECHOCARDIOGRAM  01-01-2014   mild LVH/  ef 50-93%/  grade I diastolic dysfunction/  trivial MR, TR, and PR  . VAGINAL HYSTERECTOMY  1970's     Allergies  Allergen Reactions  . Ace Inhibitors Cough  . Keflex  [Cephalexin] Diarrhea and Nausea And Vomiting  . Nitrofurantoin Diarrhea and Nausea And Vomiting  . Penicillins Hives and Other (See Comments)    Reaction:  Blisters on hands/feet  Has patient had a PCN reaction causing immediate rash, facial/tongue/throat swelling, SOB or lightheadedness with hypotension: No Has patient had a PCN reaction causing severe rash involving mucus membranes or skin necrosis: No Has patient had a PCN reaction that required hospitalization No Has patient had a PCN reaction occurring within the last 10 years: No If all of the above answers are "NO", then may proceed with Cephalosporin use.      Family History  Problem Relation Age of Onset  . Hypertension Mother   . Diabetes Mother   . Heart failure Mother   . Dementia Mother   . Emphysema Father   . Hypertension Father   . Diabetes Brother   . GER disease Brother   . Hypertension Sister   . Hypertension Sister   . Cancer      family history   . Diabetes      family history   . Heart defect      famiily history   . Arthritis      family history   . Anesthesia problems Neg Hx   . Hypotension Neg Hx   . Malignant hyperthermia Neg Hx   . Pseudochol deficiency Neg Hx   . Colon cancer Neg Hx      Social History Ms. Radloff reports that she quit smoking about 21 years ago. Her smoking use included Cigarettes. She has a 20.00 pack-year smoking history. She has never used smokeless tobacco. Ms. Isaac reports that she does not drink alcohol.   Review of Systems CONSTITUTIONAL: No weight loss, fever, chills, weakness or fatigue.  HEENT: Eyes: No visual loss, blurred vision, double vision or yellow sclerae.No hearing loss, sneezing, congestion, runny nose or sore throat.  SKIN: No rash or itching.  CARDIOVASCULAR: per hpi RESPIRATORY: per hpi GASTROINTESTINAL: No anorexia, nausea, vomiting or diarrhea. No abdominal pain or blood.  GENITOURINARY: No burning on urination, no polyuria NEUROLOGICAL:  No headache, dizziness, syncope, paralysis, ataxia, numbness or tingling in the extremities. No change in bowel or bladder control.  MUSCULOSKELETAL: No muscle, back pain, joint pain or stiffness.  LYMPHATICS: No enlarged nodes. No history of splenectomy.  PSYCHIATRIC: No history of depression or anxiety.  ENDOCRINOLOGIC: No reports of sweating, cold or heat intolerance. No polyuria or polydipsia.  Marland Kitchen   Physical Examination Vitals:   05/28/16 1453  BP: 120/79  Pulse: 89   Vitals:   05/28/16 1453  Weight: 205 lb (93 kg)  Height: 5\' 6"  (1.676 m)    Gen: resting comfortably, no acute distress HEENT: no scleral icterus, pupils equal round and reactive, no palptable cervical adenopathy,  CV: RRR, no m/r/g, no jvd Resp: Clear to auscultation bilaterally GI: abdomen is soft, non-tender, non-distended, normal bowel sounds, no hepatosplenomegaly MSK: extremities are warm, no edema.  Skin: warm, no rash Neuro:  no focal deficits Psych: appropriate affect     Assessment and Plan  1. SOB - unclear etiology. Negative cardiac workup that is fairly recent. Will consider posisble lung etiology, order PFTs. She does have a prior smoking history.  - pending results, consider if any repeat cardiac testing is indicated.       Arnoldo Lenis, M.D.

## 2016-05-28 NOTE — Patient Instructions (Signed)
Medication Instructions:  Continue all current medications.  Labwork: none  Testing/Procedures:  Your physician has recommended that you have a pulmonary function test. Pulmonary Function Tests are a group of tests that measure how well air moves in and out of your lungs.  Office will contact with results via phone or letter.    Follow-Up: 6 weeks   Any Other Special Instructions Will Be Listed Below (If Applicable).  If you need a refill on your cardiac medications before your next appointment, please call your pharmacy.

## 2016-05-31 ENCOUNTER — Other Ambulatory Visit: Payer: Self-pay | Admitting: Family Medicine

## 2016-06-06 ENCOUNTER — Encounter: Payer: Self-pay | Admitting: Gastroenterology

## 2016-06-12 IMAGING — NM NM MYOCAR SINGLE W/SPECT W/WALL MOTION & EF
2 series · 12 of 12 positions shown · non-contrast
Comparison: none

CLINICAL DATA: 68-year-old woman with history of hypertension,
diabetes mellitus, hyperlipidemia, and atypical chest discomfort.
This study is requested to evaluate for the presence of ischemia.

EXAM:
MYOCARDIAL IMAGING WITH SPECT (REST AND PHARMACOLOGIC-STRESS)
GATED LEFT VENTRICULAR WALL MOTION STUDY
LEFT VENTRICULAR EJECTION FRACTION
TECHNIQUE: Standard myocardial SPECT imaging was performed after resting
intravenous injection of 10 mCi Hc-PPm sestamibi. Subsequently,
intravenous infusion of Lexiscan was performed under the supervision
of the Cardiology staff. At peak effect of the drug, 30 mCi Hc-PPm
sestamibi was injected intravenously and standard myocardial SPECT
imaging was performed. Quantitative gated imaging was also performed
to evaluate left ventricular wall motion, and estimate left
ventricular ejection fraction.

[Series 1: rest · 8.28mm/px · 6 of 64 frames shown]
[frame 6/64]
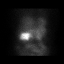
[frame 16/64]
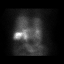
[frame 27/64]
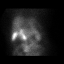
[frame 38/64]
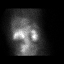
[frame 48/64]
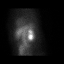
[frame 59/64]
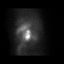

[Series 2: stress gated · 8.28mm/px · 6 of 64 frames shown]
[frame 6/64]
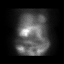
[frame 16/64]
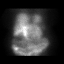
[frame 27/64]
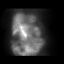
[frame 38/64]
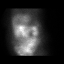
[frame 48/64]
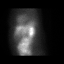
[frame 59/64]
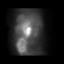

[12 of 12 positions shown; findings below may reference images not displayed]

FINDINGS: Baseline tracing shows normal sinus rhythm with decreased R wave
progression. Lexiscan bolus was given in standard fashion. Heart
rate increased from 78 beats per min up to 111 beats per min, and
blood pressure increased from 116/83 up to 131/78. Patient
experienced nausea with infusion. No diagnostic ST segment
abnormalities were noted, and there were no arrhythmias.

Analysis of the overall perfusion data finds adequate radiotracer
uptake within the myocardium, breast attenuation also noted.

Analysis of the overall perfusion data shows normal myocardial
radiotracer uptake as stress. There are no significant defects to
indicate scar or ischemia.

Gated imaging reveals an EDV of 55, ESV of 11, TID ratio of 0.81,
and LVEF of 81% without wall motion abnormalities.
IMPRESSION: Low risk Lexiscan Cardiolite. There were no diagnostic ST segment
abnormalities. Normal myocardial perfusion without evidence of scar
or ischemia. LVEF is calculated at 81% with normal wall motion and
volumes.

## 2016-06-15 ENCOUNTER — Ambulatory Visit (HOSPITAL_COMMUNITY): Admission: RE | Admit: 2016-06-15 | Payer: Medicare Other | Source: Ambulatory Visit

## 2016-06-15 ENCOUNTER — Other Ambulatory Visit: Payer: Self-pay | Admitting: Family Medicine

## 2016-06-19 DIAGNOSIS — R0902 Hypoxemia: Secondary | ICD-10-CM | POA: Diagnosis not present

## 2016-07-03 ENCOUNTER — Telehealth: Payer: Self-pay

## 2016-07-03 NOTE — Telephone Encounter (Signed)
Patient scheduled for 1/26

## 2016-07-03 NOTE — Telephone Encounter (Signed)
Needs office eval

## 2016-07-06 ENCOUNTER — Ambulatory Visit (INDEPENDENT_AMBULATORY_CARE_PROVIDER_SITE_OTHER): Payer: Medicare Other | Admitting: Family Medicine

## 2016-07-06 ENCOUNTER — Encounter: Payer: Self-pay | Admitting: Family Medicine

## 2016-07-06 VITALS — BP 110/78 | HR 92 | Temp 98.6°F | Resp 20 | Ht 66.0 in | Wt 202.0 lb

## 2016-07-06 DIAGNOSIS — I10 Essential (primary) hypertension: Secondary | ICD-10-CM | POA: Diagnosis not present

## 2016-07-06 DIAGNOSIS — M5416 Radiculopathy, lumbar region: Secondary | ICD-10-CM

## 2016-07-06 DIAGNOSIS — M5417 Radiculopathy, lumbosacral region: Secondary | ICD-10-CM

## 2016-07-06 DIAGNOSIS — E559 Vitamin D deficiency, unspecified: Secondary | ICD-10-CM

## 2016-07-06 DIAGNOSIS — J0100 Acute maxillary sinusitis, unspecified: Secondary | ICD-10-CM | POA: Diagnosis not present

## 2016-07-06 DIAGNOSIS — J019 Acute sinusitis, unspecified: Secondary | ICD-10-CM | POA: Insufficient documentation

## 2016-07-06 DIAGNOSIS — E1121 Type 2 diabetes mellitus with diabetic nephropathy: Secondary | ICD-10-CM | POA: Diagnosis not present

## 2016-07-06 DIAGNOSIS — E785 Hyperlipidemia, unspecified: Secondary | ICD-10-CM | POA: Diagnosis not present

## 2016-07-06 DIAGNOSIS — J209 Acute bronchitis, unspecified: Secondary | ICD-10-CM | POA: Insufficient documentation

## 2016-07-06 MED ORDER — DOXYCYCLINE HYCLATE 100 MG PO TABS: 100.0000 mg | ORAL_TABLET | Freq: Two times a day (BID) | ORAL | 0 refills | Status: DC

## 2016-07-06 MED ORDER — BENZONATATE 100 MG PO CAPS: 100.0000 mg | ORAL_CAPSULE | Freq: Two times a day (BID) | ORAL | 0 refills | Status: DC | PRN

## 2016-07-06 NOTE — Assessment & Plan Note (Signed)
Chronic pain and lower extremity weakness limiting mobility

## 2016-07-06 NOTE — Assessment & Plan Note (Signed)
Ms. Jani is reminded of the importance of commitment to daily physical activity for 30 minutes or more, as able and the need to limit carbohydrate intake to 30 to 60 grams per meal to help with blood sugar control.   The need to take medication as prescribed, test blood sugar as directed, and to call between visits if there is a concern that blood sugar is uncontrolled is also discussed.   Ms. Demarinis is reminded of the importance of daily foot exam, annual eye examination, and good blood sugar, blood pressure and cholesterol control.  Diabetic Labs Latest Ref Rng & Units 03/30/2016 03/07/2016 02/27/2016 02/26/2016 02/25/2016  HbA1c <5.7 % 6.4(H) - - 6.7(H) -  Microalbumin <2.0 mg/dL - - - - -  Micro/Creat Ratio 0.0 - 30.0 mg/g - - - - -  Chol 125 - 200 mg/dL 196 - - 175 -  HDL >=46 mg/dL 54 - - 40(L) -  Calc LDL <130 mg/dL 118 - - 105(H) -  Triglycerides <150 mg/dL 118 - - 148 -  Creatinine 0.60 - 0.93 mg/dL 1.46(H) 1.46(H) 1.52(H) 1.52(H) 1.60(H)   BP/Weight 07/06/2016 05/28/2016 04/11/2016 04/03/2016 03/07/2016 03/06/2016 3/54/6568  Systolic BP 127 517 001 749 449 675 916  Diastolic BP 78 79 82 86 78 84 65  Wt. (Lbs) 202 205 199 199.08 195 195.12 -  BMI 32.6 33.09 32.12 32.13 31.47 31.49 -   Foot/eye exam completion dates Latest Ref Rng & Units 02/24/2016 07/27/2015  Eye Exam No Retinopathy - No Retinopathy  Foot exam Order - - -  Foot Form Completion - Done -      Updated lab needed at/ before next visit.

## 2016-07-06 NOTE — Assessment & Plan Note (Signed)
Controlled, no change in medication DASH diet and commitment to daily physical activity for a minimum of 30 minutes discussed and encouraged, as a part of hypertension management. The importance of attaining a healthy weight is also discussed.  BP/Weight 07/06/2016 05/28/2016 04/11/2016 04/03/2016 03/07/2016 03/06/2016 02/24/3845  Systolic BP 659 935 701 779 390 300 923  Diastolic BP 78 79 82 86 78 84 65  Wt. (Lbs) 202 205 199 199.08 195 195.12 -  BMI 32.6 33.09 32.12 32.13 31.47 31.49 -

## 2016-07-06 NOTE — Progress Notes (Signed)
Cheryl Abbott     MRN: [id number]      DOB: [birth year]   HPI Cheryl Abbott  1 week h/o worsening head and chest congestion, associated with fever and chills intermittently. Nasal drainage has thickened , and is yellowish green, and at times bloody. Sputum is thick and yellow. C/o bilateral ear pressure, denies hearing loss and sore throat. Increasing fatigue , poor appetitie and sleep disturbed by cough. No improvement with OTC medication.  Has PFT scheduled for next week , but currently has respiratory infection , I have advised that she reschedule  ROS  Denies chest pains, palpitations and leg swelling Denies abdominal pain, nausea, vomiting,diarrhea or constipation.   Denies dysuria, frequency, hesitancy or incontinence. C/o chronic joint pain, and limitation in mobility. Denies headaches, seizures, numbness, or tingling. Denies uncontrolled  depression, anxiety or insomnia. Denies skin break down or rash.   PE  BP 110/78 (BP Location: Left Arm, Patient Position: Sitting, Cuff Size: Large)   Pulse 92   Temp 98.6 F (37 C) (Oral)   Resp 20   Ht 5\' 6"  (1.676 m)   Wt 202 lb (91.6 kg)   SpO2 93%   BMI 32.60 kg/m   Patient alert and oriented and in no cardiopulmonary distress.  HEENT: No facial asymmetry, EOMI,   oropharynx pink and moist.  Neck supple no JVD, no mass.Maxillary sinus tenderness, bilateral anterior cervical adenopathy  Chest: decreased air entry, bibasilar crackles and few wheezes  CVS: S1, S2 no murmurs, no S3.Regular rate.  ABD: Soft non tender.   Ext: No edema  MS: Decreased  ROM spine, shoulders, hips and knees.  Skin: Intact, no ulcerations or rash noted.  Psych: Good eye contact, normal affect. Memory intact not anxious or depressed appearing.  CNS: CN 2-12 intact, power,  normal throughout.no focal deficits noted.   Assessment & Plan  Acute bronchitis 1 week course of doxycycline prescribed and decongestant  Acute  sinusitis Doxycycline and saline nasal flushes twice daily  Hypertension goal BP (blood pressure) < 130/80 Controlled, no change in medication DASH diet and commitment to daily physical activity for a minimum of 30 minutes discussed and encouraged, as a part of hypertension management. The importance of attaining a healthy weight is also discussed.  BP/Weight 07/06/2016 05/28/2016 04/11/2016 04/03/2016 03/07/2016 03/06/2016 0/30/0923  Systolic BP 300 762 263 335 456 256 389  Diastolic BP 78 79 82 86 78 84 65  Wt. (Lbs) 202 205 199 199.08 195 195.12 -  BMI 32.6 33.09 32.12 32.13 31.47 31.49 -       Type 2 diabetes with nephropathy ([place]) Cheryl Abbott is reminded of the importance of commitment to daily physical activity for 30 minutes or more, as able and the need to limit carbohydrate intake to 30 to 60 grams per meal to help with blood sugar control.   The need to take medication as prescribed, test blood sugar as directed, and to call between visits if there is a concern that blood sugar is uncontrolled is also discussed.   Cheryl Abbott is reminded of the importance of daily foot exam, annual eye examination, and good blood sugar, blood pressure and cholesterol control.  Diabetic Labs Latest Ref Rng & Units 03/30/2016 03/07/2016 02/27/2016 02/26/2016 02/25/2016  HbA1c <5.7 % 6.4(H) - - 6.7(H) -  Microalbumin <2.0 mg/dL - - - - -  Micro/Creat Ratio 0.0 - 30.0 mg/g - - - - -  Chol 125 - 200 mg/dL 196 - - 175 -  HDL >=46 mg/dL 54 - - 40(L) -  Calc LDL <130 mg/dL 118 - - 105(H) -  Triglycerides <150 mg/dL 118 - - 148 -  Creatinine 0.60 - 0.93 mg/dL 1.46(H) 1.46(H) 1.52(H) 1.52(H) 1.60(H)   BP/Weight 07/06/2016 05/28/2016 04/11/2016 04/03/2016 03/07/2016 03/06/2016 0/17/4944  Systolic BP 967 591 638 466 599 357 017  Diastolic BP 78 79 82 86 78 84 65  Wt. (Lbs) 202 205 199 199.08 195 195.12 -  BMI 32.6 33.09 32.12 32.13 31.47 31.49 -   Foot/eye exam completion dates Latest Ref Rng & Units  02/24/2016 07/27/2015  Eye Exam No Retinopathy - No Retinopathy  Foot exam Order - - -  Foot Form Completion - Done -      Updated lab needed at/ before next visit.   Anxiety and depression Controlled, no change in medication   Lumbar back pain with radiculopathy affecting left lower extremity Chronic pain and lower extremity weakness limiting mobility

## 2016-07-06 NOTE — Assessment & Plan Note (Signed)
Doxycycline and saline nasal flushes twice daily

## 2016-07-06 NOTE — Assessment & Plan Note (Signed)
1 week course of doxycycline prescribed and decongestant

## 2016-07-06 NOTE — Patient Instructions (Addendum)
Cancel Feb appointment, f/u early May instead  Fasting lpid, cmp HBA1C , vit D end April  You are treated for acute infection in chest  Please call and discuss with cardiology and also with hospital to possibly  Reschedule lung function test

## 2016-07-06 NOTE — Assessment & Plan Note (Signed)
Controlled, no change in medication  

## 2016-07-10 ENCOUNTER — Encounter: Payer: Self-pay | Admitting: *Deleted

## 2016-07-11 ENCOUNTER — Ambulatory Visit: Payer: Medicare Other | Admitting: Cardiology

## 2016-07-18 ENCOUNTER — Other Ambulatory Visit: Payer: Self-pay | Admitting: Family Medicine

## 2016-07-20 DIAGNOSIS — R0902 Hypoxemia: Secondary | ICD-10-CM | POA: Diagnosis not present

## 2016-07-27 ENCOUNTER — Ambulatory Visit (HOSPITAL_COMMUNITY)
Admission: RE | Admit: 2016-07-27 | Discharge: 2016-07-27 | Disposition: A | Discharge status: Home / Self Care | Payer: Medicare Other | Source: Ambulatory Visit | Attending: Cardiology | Admitting: Cardiology

## 2016-07-27 DIAGNOSIS — R0602 Shortness of breath: Secondary | ICD-10-CM

## 2016-07-27 LAB — PULMONARY FUNCTION TEST
DL/VA % pred: 85 %
DL/VA: 4.36 ml/min/mmHg/L
DLCO unc % pred: 56 %
DLCO unc: 15.64 ml/min/mmHg
FEF 25-75 Post: 2.35 L/sec
FEF 25-75 Pre: 2.83 L/sec
FEF2575-%Change-Post: -16 %
FEF2575-%Pred-Post: 130 %
FEF2575-%Pred-Pre: 157 %
FEV1-%Change-Post: 0 %
FEV1-%Pred-Post: 103 %
FEV1-%Pred-Pre: 104 %
FEV1-Post: 2.09 L
FEV1-Pre: 2.1 L
FEV1FVC-%Change-Post: -4 %
FEV1FVC-%Pred-Pre: 111 %
FEV6-%Change-Post: -2 %
FEV6-%Pred-Post: 95 %
FEV6-%Pred-Pre: 98 %
FEV6-Post: 2.38 L
FEV6-Pre: 2.45 L
FEV6FVC-%Pred-Post: 104 %
FEV6FVC-%Pred-Pre: 104 %
FVC-%Change-Post: 3 %
FVC-%Pred-Post: 97 %
FVC-%Pred-Pre: 94 %
FVC-Post: 2.53 L
FVC-Pre: 2.45 L
Post FEV1/FVC ratio: 82 %
Post FEV6/FVC ratio: 100 %
Pre FEV1/FVC ratio: 86 %
Pre FEV6/FVC Ratio: 100 %
RV % pred: 39 %
RV: 0.93 L
TLC % pred: 69 %
TLC: 3.8 L

## 2016-07-27 MED ORDER — ALBUTEROL SULFATE (2.5 MG/3ML) 0.083% IN NEBU
2.5000 mg | INHALATION_SOLUTION | Freq: Once | RESPIRATORY_TRACT | Status: AC
  Administered 2016-07-27: 2.5 mg via RESPIRATORY_TRACT

## 2016-07-31 ENCOUNTER — Telehealth: Payer: Self-pay | Admitting: *Deleted

## 2016-07-31 NOTE — Telephone Encounter (Signed)
-----   Message from Arnoldo Lenis, MD sent at 07/30/2016 12:41 PM EST ----- Breathing tests overall look good. Based on prior heart and lung tests no clear cause of her SOB. She can discuss with Dr Moshe Cipro at there next f/u other possible causes. We will f/u with her in 6 months  J Branch mD

## 2016-07-31 NOTE — Telephone Encounter (Signed)
Pt aware - recall placed - pt will f/u with Dr. Moshe Cipro regarding SOB

## 2016-08-01 ENCOUNTER — Other Ambulatory Visit: Payer: Self-pay | Admitting: Family Medicine

## 2016-08-06 ENCOUNTER — Ambulatory Visit: Payer: Medicare Other | Admitting: Family Medicine

## 2016-08-09 ENCOUNTER — Encounter: Payer: Self-pay | Admitting: Gastroenterology

## 2016-08-17 ENCOUNTER — Other Ambulatory Visit: Payer: Self-pay | Admitting: Family Medicine

## 2016-08-17 DIAGNOSIS — Z1231 Encounter for screening mammogram for malignant neoplasm of breast: Secondary | ICD-10-CM

## 2016-08-17 DIAGNOSIS — R0902 Hypoxemia: Secondary | ICD-10-CM | POA: Diagnosis not present

## 2016-08-31 ENCOUNTER — Ambulatory Visit: Payer: Medicare Other | Admitting: Cardiology

## 2016-09-01 ENCOUNTER — Other Ambulatory Visit: Payer: Self-pay | Admitting: Family Medicine

## 2016-09-09 ENCOUNTER — Other Ambulatory Visit: Payer: Self-pay | Admitting: Family Medicine

## 2016-09-17 ENCOUNTER — Encounter (HOSPITAL_COMMUNITY): Payer: Self-pay | Admitting: *Deleted

## 2016-09-17 ENCOUNTER — Other Ambulatory Visit: Payer: Self-pay | Admitting: Family Medicine

## 2016-09-17 ENCOUNTER — Emergency Department (HOSPITAL_COMMUNITY)
Admission: EM | Admit: 2016-09-17 | Discharge: 2016-09-18 | Disposition: A | Discharge status: Home / Self Care | Payer: Medicare Other | Attending: Emergency Medicine | Admitting: Emergency Medicine

## 2016-09-17 ENCOUNTER — Emergency Department (HOSPITAL_COMMUNITY): Payer: Medicare Other

## 2016-09-17 DIAGNOSIS — N200 Calculus of kidney: Secondary | ICD-10-CM | POA: Diagnosis not present

## 2016-09-17 DIAGNOSIS — N3000 Acute cystitis without hematuria: Secondary | ICD-10-CM | POA: Diagnosis not present

## 2016-09-17 DIAGNOSIS — E1165 Type 2 diabetes mellitus with hyperglycemia: Secondary | ICD-10-CM | POA: Insufficient documentation

## 2016-09-17 DIAGNOSIS — Z79899 Other long term (current) drug therapy: Secondary | ICD-10-CM | POA: Diagnosis not present

## 2016-09-17 DIAGNOSIS — I10 Essential (primary) hypertension: Secondary | ICD-10-CM | POA: Insufficient documentation

## 2016-09-17 DIAGNOSIS — Z7982 Long term (current) use of aspirin: Secondary | ICD-10-CM | POA: Insufficient documentation

## 2016-09-17 DIAGNOSIS — R1030 Lower abdominal pain, unspecified: Secondary | ICD-10-CM | POA: Diagnosis present

## 2016-09-17 DIAGNOSIS — Z87891 Personal history of nicotine dependence: Secondary | ICD-10-CM | POA: Insufficient documentation

## 2016-09-17 DIAGNOSIS — R1031 Right lower quadrant pain: Secondary | ICD-10-CM | POA: Diagnosis not present

## 2016-09-17 DIAGNOSIS — R739 Hyperglycemia, unspecified: Secondary | ICD-10-CM

## 2016-09-17 DIAGNOSIS — R0902 Hypoxemia: Secondary | ICD-10-CM | POA: Diagnosis not present

## 2016-09-17 LAB — CBC
HCT: 36.4 % (ref 36.0–46.0)
Hemoglobin: 11.6 g/dL — ABNORMAL LOW (ref 12.0–15.0)
MCH: 27.1 pg (ref 26.0–34.0)
MCHC: 31.9 g/dL (ref 30.0–36.0)
MCV: 85 fL (ref 78.0–100.0)
Platelets: 317 10*3/uL (ref 150–400)
RBC: 4.28 MIL/uL (ref 3.87–5.11)
RDW: 18.2 % — ABNORMAL HIGH (ref 11.5–15.5)
WBC: 7.3 10*3/uL (ref 4.0–10.5)

## 2016-09-17 LAB — URINALYSIS, ROUTINE W REFLEX MICROSCOPIC
Bilirubin Urine: NEGATIVE
Glucose, UA: 150 mg/dL — AB
Ketones, ur: NEGATIVE mg/dL
Nitrite: NEGATIVE
Protein, ur: 30 mg/dL — AB
Specific Gravity, Urine: 1.013 (ref 1.005–1.030)
pH: 6 (ref 5.0–8.0)

## 2016-09-17 LAB — COMPREHENSIVE METABOLIC PANEL
ALT: 16 U/L (ref 14–54)
AST: 21 U/L (ref 15–41)
Albumin: 3.7 g/dL (ref 3.5–5.0)
Alkaline Phosphatase: 113 U/L (ref 38–126)
Anion gap: 9 (ref 5–15)
BUN: 15 mg/dL (ref 6–20)
CO2: 25 mmol/L (ref 22–32)
Calcium: 9.5 mg/dL (ref 8.9–10.3)
Chloride: 99 mmol/L — ABNORMAL LOW (ref 101–111)
Creatinine, Ser: 1.41 mg/dL — ABNORMAL HIGH (ref 0.44–1.00)
GFR calc Af Amer: 42 mL/min — ABNORMAL LOW (ref >60)
GFR calc non Af Amer: 36 mL/min — ABNORMAL LOW (ref >60)
Glucose, Bld: 230 mg/dL — ABNORMAL HIGH (ref 65–99)
Potassium: 3.2 mmol/L — ABNORMAL LOW (ref 3.5–5.1)
Sodium: 133 mmol/L — ABNORMAL LOW (ref 135–145)
Total Bilirubin: 0.1 mg/dL — ABNORMAL LOW (ref 0.3–1.2)
Total Protein: 8.1 g/dL (ref 6.5–8.1)

## 2016-09-17 LAB — CBG MONITORING, ED: Glucose-Capillary: 248 mg/dL — ABNORMAL HIGH (ref 65–99)

## 2016-09-17 LAB — LIPASE, BLOOD: Lipase: 38 U/L (ref 11–51)

## 2016-09-17 MED ORDER — ACETAMINOPHEN 325 MG PO TABS
650.0000 mg | ORAL_TABLET | Freq: Once | ORAL | Status: AC
  Administered 2016-09-17: 650 mg via ORAL
  Filled 2016-09-17: qty 2

## 2016-09-17 MED ORDER — MORPHINE SULFATE (PF) 4 MG/ML IV SOLN
4.0000 mg | Freq: Once | INTRAVENOUS | Status: DC
  Filled 2016-09-17: qty 1

## 2016-09-17 MED ORDER — CIPROFLOXACIN HCL 250 MG PO TABS
500.0000 mg | ORAL_TABLET | Freq: Once | ORAL | Status: AC
  Administered 2016-09-17: 500 mg via ORAL
  Filled 2016-09-17: qty 2

## 2016-09-17 MED ORDER — CIPROFLOXACIN HCL 500 MG PO TABS: 500.0000 mg | ORAL_TABLET | Freq: Two times a day (BID) | ORAL | 0 refills | Status: AC

## 2016-09-17 MED ORDER — SODIUM CHLORIDE 0.9 % IV SOLN: INTRAVENOUS | Status: DC

## 2016-09-17 NOTE — ED Notes (Signed)
Pt states that she prefers to not have an IV unless she is being admitted.  Informed Dr Tomi Bamberger.  Given verbal orders to DC morphine and fluids and give Tylenol PO

## 2016-09-17 NOTE — Discharge Instructions (Signed)
Take the antibiotics as prescribed for your urinary tract infection, follow up with your primary care doctor to make sure your infection has resolved

## 2016-09-17 NOTE — ED Provider Notes (Signed)
[facility] DEPT Provider Note   CSN: [id number] Arrival date & time: 09/17/16  1759     History   Chief Complaint Chief Complaint  Patient presents with  . Abdominal Pain  . Hyperglycemia    HPI Cheryl Abbott is a 71 y.o. female.  HPI Patient presents to the emergency room for evaluation of lower abdominal pain. Patient states she started having trouble with vomiting and diarrhea last week. Symptoms persisted until the last couple of days when she stopped having any vomiting or diarrhea. She continues to have discomfort in her lower abdomen. It's more on the right side. She denies any dysuria or hematuria but she is concerned about the possibility of a kidney stone because she's had them before and has had to have surgery. The patient also feels like her abdomen is bloated.  He denies any fevers. She has had trouble for several months with elevated blood sugars up to the 300s. Her primary doctor took her off her diabetes and some her blood pressure medications because the last time she was in the hospital for blood sugar blood pressure was low. She has not seen her doctor in a few months.  She called the office but has not been able to see her doctor. Past Medical History:  Diagnosis Date  . Anemia   . Anxiety   . Anxiety disorder   . Arthritis   . 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 Chronic back pain   . Depression   . Fatty liver   . GERD (gastroesophageal reflux disease)   . Headache   . History of adenomatous polyp of colon    2008  . History of kidney stones   . Hypercalcemia   . Hyperlipidemia   . Hypertension   . Left ureteral stone   . Lipoma of arm 01/19/2015  . Lumbar disc disease with radiculopathy   . Nephrolithiasis   . Neuropathy, peripheral ([place])   . OSA treated with BiPAP    per study 2007  . Sciatica   . Sigmoid diverticulosis   . Type 2 diabetes mellitus [facility])     Patient Active Problem List   Diagnosis Date Noted  . Acute  bronchitis 07/06/2016  . Acute sinusitis 07/06/2016  . Neuropathic pain of both feet ([place]) 04/11/2016  . TIA (transient ischemic attack) 02/26/2016  . Lactose intolerance in adult 02/01/2016  . Brain TIA 08/19/2015  . CKD (chronic kidney disease) stage 3, GFR 30-59 ml/min 07/12/2015  . Constipation 05/19/2015  . Lipoma of arm 01/19/2015  . Back pain   . Allergic cough 12/07/2014  . Anemia 10/14/2014  . Lumbar back pain with radiculopathy affecting left lower extremity 05/11/2014  . Nocturnal hypoxia 02/04/2014  . Generalized anxiety disorder 05/20/2013  . Dysphagia, idiopathic 03/04/2013  . Anxiety and depression 08/13/2012  . Abnormal brain scan 02/07/2012  . Thoracic or lumbosacral neuritis or radiculitis, unspecified 12/22/2010  . Hypercalcemia 11/30/2010  . Colon adenomas 05/11/2009  . HEMORRHOIDS 03/15/2009  . GERD 03/15/2009  . FATTY LIVER DISEASE 03/15/2009  . RENAL CALCULUS 03/15/2009  . Chest pain 01/19/2009  . Sleep apnea 09/15/2008  . 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 Hyperlipidemia LDL goal <100 09/17/2007  . Hypertension goal BP (blood pressure) < 130/80 09/17/2007    Past Surgical History:  Procedure Laterality Date  . BIOPSY N/A 03/17/2013   Procedure: GASTRIC BIOPSIES;  Surgeon: Danie Binder, MD;  Location: [facility];  Service: Endoscopy;  Laterality: N/A;  .  BIOPSY  06/10/2015   Procedure: BIOPSY;  Surgeon: Danie Binder, MD;  Location: [facility];  Service: Endoscopy;;  gastric biopsy  . CARDIAC CATHETERIZATION  05-11-2003  dr Shelva Majestic ([facility])   Abnormal cardiolite/   Normal coronary arteries and normal LVF,  ef  63%  . CARDIOVASCULAR STRESS TEST  01-01-2014   normal lexiscan cardiolite/  no ischemia/ infarct/  normal LV function and wall motion ,  ef 81%  . COLONOSCOPY  last one 10/02/2011   MOD [place] TICS, IH, NEXT TCS APR 2018  . CYSTO/   RIGHT URETEROSOCOPY LASER LITHOTRIPSY STONE EXTRACTION  10-13-2004  .  CYSTO/  RIGHT RETROGRADE PYELOGRAM/  PLACMENT RIGHT URETERAL STENT  01-10-2010  . CYSTOSCOPY W/ URETERAL STENT PLACEMENT Right 08/19/2015   Procedure: CYSTOSCOPY WITH RETROGRADE PYELOGRAM/URETERAL STENT PLACEMENT;  Surgeon: Franchot Gallo, MD;  Location: [facility];  Service: Urology;  Laterality: Right;  . CYSTOSCOPY WITH RETROGRADE PYELOGRAM, URETEROSCOPY AND STENT PLACEMENT Left 10/21/2014   Procedure: CYSTOSCOPY WITH RETROGRADE PYELOGRAM, URETEROSCOPY AND STENT PLACEMENT;  Surgeon: Franchot Gallo, MD;  Location[facility];  Service: Urology;  Laterality: Left;  . CYSTOSCOPY WITH RETROGRADE PYELOGRAM, URETEROSCOPY AND STENT PLACEMENT Right 11/22/2015   Procedure: CYSTOSCOPY, RIGHT URETERAL STENT REMOVAL; RIGHT RETROGRADE PYELOGRAM, RIGHT URETEROSCOPY WITH BALLOON DILATION; RIGHT URETERAL STENT PLACEMENT;  Surgeon: Franchot Gallo, MD;  Location: [facility];  Service: Urology;  Laterality: Right;  . CYSTOSCOPY/RETROGRADE/URETEROSCOPY/STONE EXTRACTION WITH BASKET Bilateral 08/02/2015   Procedure: CYSTOSCOPY; BILATERAL RETROGRADE PYELOGRAMS; BILATERAL URETEROSCOPY, STONE EXTRACTION WITH BASKET;  Surgeon: Franchot Gallo, MD;  Location: [facility];  Service: Urology;  Laterality: Bilateral;  . CYSTOSCOPY/RETROGRADE/URETEROSCOPY/STONE EXTRACTION WITH BASKET Right 06/28/2015   Procedure: CYSTOSCOPY/RETROGRADE/URETEROSCOPY/STONE EXTRACTION WITH BASKET, RIGHT URETERAL DOUBLE J STENT PLACEMENT;  Surgeon: Franchot Gallo, MD;  Location: [facility];  Service: Urology;  Laterality: Right;  . ESOPHAGOGASTRODUODENOSCOPY (EGD) WITH PROPOFOL N/A 03/17/2013   Procedure: ESOPHAGOGASTRODUODENOSCOPY (EGD) WITH PROPOFOL;  Surgeon: Danie Binder, MD;  Location: [facility];  Service: Endoscopy;  Laterality: N/A;  . ESOPHAGOGASTRODUODENOSCOPY (EGD) WITH PROPOFOL N/A 06/10/2015   Procedure: ESOPHAGOGASTRODUODENOSCOPY (EGD) WITH PROPOFOL;  Surgeon: Danie Binder, MD;  [facility];  Service: Endoscopy;   Laterality: N/A;  1245 - pt knows to arrive at 8:30  . FLEXIBLE SIGMOIDOSCOPY  09/11/2011   [id number] Internal hemorrhoids  . HOLMIUM LASER APPLICATION Left 2/69/4854   Procedure: HOLMIUM LASER APPLICATION;  Surgeon: Franchot Gallo, MD;  Location: [facility];  Service: Urology;  Laterality: Left;  . HOLMIUM LASER APPLICATION Right 12/05/348   Procedure: HOLMIUM LASER APPLICATION;  Surgeon: Franchot Gallo, MD;  Location: [facility];  Service: Urology;  Laterality: Right;  . MASS EXCISION Left 03/04/2015   Procedure: EXCISION OF SOFT TISSUE NEOPLASM LEFT ARM;  Surgeon: Aviva Signs, MD;  Location: [facility];  Service: General;  Laterality: Left;  . PERCUTANEOUS NEPHROSTOLITHOTOMY Bilateral 12/  2011     [facility]  . POLYPECTOMY N/A 03/17/2013   Procedure: GASTRIC POLYPECTOMY;  Surgeon: Danie Binder, MD;  Location: [facility];  Service: Endoscopy;  Laterality: N/A;  . REMOVAL RIGHT THIGH CYST  2006  . SAVORY DILATION N/A 03/17/2013   Procedure: SAVORY DILATION;  Surgeon: Danie Binder, MD;  Location: [facility];  Service: Endoscopy;  Laterality: N/A;  #12.8, 14, 15, 16 dilators used  . SAVORY DILATION N/A 06/10/2015   Procedure: SAVORY DILATION;  Surgeon: Danie Binder, MD;  [facility];  Service: Endoscopy;  Laterality: N/A;  .  TRANSTHORACIC ECHOCARDIOGRAM  01-01-2014   mild LVH/  ef 18-56%/  grade I diastolic dysfunction/  trivial MR, TR, and PR  . VAGINAL HYSTERECTOMY  1970's    OB History    Gravida Para Term Preterm AB Living   2 2 2     2    SAB TAB Ectopic Multiple Live Births                   Home Medications    Prior to Admission medications   Medication Sig Start Date End Date Taking? Authorizing Provider  ALPRAZolam Duanne Moron) 0.5 MG tablet TAKE 1 TABLET BY MOUTH TWICE A DAY 09/10/16  Yes [name], MD  amLODipine (NORVASC) 10 MG tablet TAKE 1 TABLET (10 MG TOTAL) BY MOUTH DAILY. 06/18/16  Yes [name], MD  aspirin EC 325 MG tablet Take 1  tablet (325 mg total) by mouth daily. 02/27/16  Yes Lavina Hamman, MD  betamethasone dipropionate (DIPROLENE) 0.05 % cream Apply sparingly twice daily to rash  For 7 to 10 days only Patient taking differently: Apply topically daily as needed. Apply sparingly twice daily to rash  For 7 to 10 days only 04/11/16  Yes [name], MD  diphenhydramine-acetaminophen (TYLENOL PM) 25-500 MG TABS tablet Take 2 tablets by mouth at bedtime.   Yes Historical Provider, MD  docusate sodium (COLACE) 100 MG capsule Take 100 mg by mouth at bedtime.    Yes Historical Provider, MD  hydrOXYzine (ATARAX/VISTARIL) 10 MG tablet TAKE ONE TABLET BY MOUTH AT BEDTIME FOR ITCHING AND BURNING 08/01/16  Yes [name], MD  ibuprofen (ADVIL,MOTRIN) 800 MG tablet TAKE 1 TABLET BY MOUTH EVERY DAY AS NEEDED (MUST LAST 90 DAYS) 04/20/16  Yes [name], MD  lidocaine (LMX) 4 % cream Apply 1 application topically as needed.   Yes Historical Provider, MD  lovastatin (MEVACOR) 40 MG tablet TAKE 2 TABLETS (80 MG TOTAL) BY MOUTH AT BEDTIME. 07/19/16  Yes [name], MD  pantoprazole (PROTONIX) 40 MG tablet TAKE 1 TABLET (40 MG TOTAL) BY MOUTH 2 (TWO) TIMES DAILY. 09/03/16  Yes [name], MD  PARoxetine (PAXIL) 40 MG tablet TAKE 1 TABLET (40 MG TOTAL) BY MOUTH EVERY MORNING. 03/19/16  Yes [name], MD  Vitamin D, Ergocalciferol, (DRISDOL) 50000 units CAPS capsule Take 50,000 Units by mouth every 7 (seven) days.   Yes Historical Provider, MD  benzonatate (TESSALON) 100 MG capsule Take 1 capsule (100 mg total) by mouth 2 (two) times daily as needed for cough. Patient not taking: Reported on 09/17/2016 07/06/16   [name], MD  ciprofloxacin (CIPRO) 500 MG tablet Take 1 tablet (500 mg total) by mouth 2 (two) times daily. 09/17/16 09/24/16  Dorie Rank, MD  doxycycline (VIBRA-TABS) 100 MG tablet Take 1 tablet (100 mg total) by mouth 2 (two) times daily. Patient not taking: Reported on 09/17/2016  07/06/16   [name], MD    Family History Family History  Problem Relation Age of Onset  . Hypertension Mother   . Diabetes Mother   . Heart failure Mother   . Dementia Mother   . Emphysema Father   . Hypertension Father   . Diabetes Brother   . GER disease Brother   . Hypertension Sister   . Hypertension Sister   . Cancer      family history   . Diabetes      family history   . Heart defect  famiily history   . Arthritis      family history   . Anesthesia problems Neg Hx   . Hypotension Neg Hx   . Malignant hyperthermia Neg Hx   . Pseudochol deficiency Neg Hx   . Colon cancer Neg Hx     Social History Social History  Substance Use Topics  . Smoking status: Former Smoker    Packs/day: 1.00    Years: 20.00    Types: Cigarettes    Start date: 09/21/1974    Quit date: 09/21/1994  . Smokeless tobacco: Never Used     Comment: Quit x 20 years  . Alcohol use No     Allergies   Ace inhibitors; Keflex [cephalexin]; Nitrofurantoin; and Penicillins   Review of Systems Review of Systems  All other systems reviewed and are negative.    Physical Exam Updated Vital Signs BP 133/84 (BP Location: Right Arm)   Pulse 98   Temp 98.8 F (37.1 C) (Oral)   Resp 18   Ht 5' 6.5" (1.689 m)   Wt 90.7 kg   SpO2 95%   BMI 31.80 kg/m   Physical Exam  Constitutional: She appears well-developed and well-nourished. No distress.  HENT:  Head: Normocephalic and atraumatic.  Right Ear: External ear normal.  Left Ear: External ear normal.  Eyes: Conjunctivae are normal. Right eye exhibits no discharge. Left eye exhibits no discharge. No scleral icterus.  Neck: Neck supple. No tracheal deviation present.  Cardiovascular: Normal rate, regular rhythm and intact distal pulses.   Pulmonary/Chest: Effort normal and breath sounds normal. No stridor. No respiratory distress. She has no wheezes. She has no rales.  Abdominal: Soft. Bowel sounds are normal. She exhibits no  distension. There is tenderness. There is no rebound and no guarding.  Mild right lower quadrant, no peritoneal signs no guarding no masses or hernia  Musculoskeletal: She exhibits no edema or tenderness.  Neurological: She is alert. She has normal strength. No cranial nerve deficit (no facial droop, extraocular movements intact, no slurred speech) or sensory deficit. She exhibits normal muscle tone. She displays no seizure activity. Coordination normal.  Skin: Skin is warm and dry. No rash noted.  Psychiatric: She has a normal mood and affect.  Nursing note and vitals reviewed.    ED Treatments / Results  Labs (all labs ordered are listed, but only abnormal results are displayed) Labs Reviewed  COMPREHENSIVE METABOLIC PANEL - Abnormal; Notable for the following:       Result Value   Sodium 133 (*)    Potassium 3.2 (*)    Chloride 99 (*)    Glucose, Bld 230 (*)    Creatinine, Ser 1.41 (*)    Total Bilirubin 0.1 (*)    GFR calc non Af Amer 36 (*)    GFR calc Af Amer 42 (*)    All other components within normal limits  CBC - Abnormal; Notable for the following:    Hemoglobin 11.6 (*)    RDW 18.2 (*)    All other components within normal limits  URINALYSIS, ROUTINE W REFLEX MICROSCOPIC - Abnormal; Notable for the following:    APPearance CLOUDY (*)    Glucose, UA 150 (*)    Hgb urine dipstick SMALL (*)    Protein, ur 30 (*)    Leukocytes, UA SMALL (*)    Bacteria, UA RARE (*)    Squamous Epithelial / LPF 0-5 (*)    All other components within normal limits  CBG MONITORING, ED -  Abnormal; Notable for the following:    Glucose-Capillary 248 (*)    All other components within normal limits  LIPASE, BLOOD    Radiology Dg Abd 2 Views  Result Date: 09/17/2016 CLINICAL DATA:  Kidney stone extraction. EXAM: ABDOMEN - 2 VIEW COMPARISON:  None. FINDINGS: Nonobstructive gas pattern. No definite calculi are seen. No osseous findings of significance. Chronically elevated RIGHT  hemidiaphragm. IMPRESSION: Unremarkable two-view abdomen. Electronically Signed   By: Staci Righter M.D.   On: 09/17/2016 21:34    Procedures Procedures (including critical care time)  Medications Ordered in ED Medications  acetaminophen (TYLENOL) tablet 650 mg (650 mg Oral Given 09/17/16 2053)  ciprofloxacin (CIPRO) tablet 500 mg (500 mg Oral Given 09/17/16 2222)     Initial Impression / Assessment and Plan / ED Course  I have reviewed the triage vital signs and the nursing notes.  Pertinent labs & imaging results that were available during my care of the patient were reviewed by me and considered in my medical decision making (see chart for details).   Pt has a benign abdominal exam.  No TTP on my exam.  No mass or distension.  Labs and xrays reassuring.  Hyperglycemia but no signs of acute complications.  UA is abnormal.   Will dc home with abx course.  Follow up with PCP  Final Clinical Impressions(s) / ED Diagnoses   Final diagnoses:  Lower abdominal pain  Acute cystitis without hematuria    New Prescriptions New Prescriptions   CIPROFLOXACIN (CIPRO) 500 MG TABLET    Take 1 tablet (500 mg total) by mouth 2 (two) times daily.     Dorie Rank, MD 09/17/16 2237

## 2016-09-17 NOTE — ED Triage Notes (Signed)
Pt c/o hyperglycemia, lower abdominal pain, vomiting, diarrhea since last week. Pt reports she was taken off her diabetes medications 6 months ago by her PCP and her sugars have been up and down. Pt reports her sugars have been up to 315 over the last week. Today in triage CBG 248.

## 2016-09-17 NOTE — ED Notes (Signed)
Pt states she wants a CT scan to see if she has more kidney stones or other problems.  Pt states "what scared me was I just found out my neighbor that has been sick for a while has stage 4 cancer and no one ever knew until it was too late and I'm scared something is wrong and nobody knows"

## 2016-09-18 NOTE — ED Notes (Signed)
Pt states understanding of care given and follow up instructions.  Pt a/o

## 2016-09-27 ENCOUNTER — Telehealth: Payer: Self-pay

## 2016-09-27 ENCOUNTER — Telehealth: Payer: Self-pay | Admitting: Family Medicine

## 2016-09-27 ENCOUNTER — Other Ambulatory Visit: Payer: Self-pay

## 2016-09-27 DIAGNOSIS — N390 Urinary tract infection, site not specified: Secondary | ICD-10-CM

## 2016-09-27 NOTE — Telephone Encounter (Signed)
Patient came in to let Dr.Simpson know she was seen at [facility] for a UTI. She received antibiotics, she gave a urine sample to [facility] today, patient came in to request an order per [facility] to check urine incase she needs more antibiotics. Selena took care of this. Patient will call if she needs anything further.

## 2016-09-28 NOTE — Telephone Encounter (Signed)
Error

## 2016-10-05 ENCOUNTER — Telehealth: Payer: Self-pay

## 2016-10-05 DIAGNOSIS — N390 Urinary tract infection, site not specified: Secondary | ICD-10-CM

## 2016-10-05 NOTE — Telephone Encounter (Signed)
uture orders placed for ua

## 2016-10-08 ENCOUNTER — Other Ambulatory Visit: Payer: Self-pay

## 2016-10-11 ENCOUNTER — Encounter: Payer: Self-pay | Admitting: Family Medicine

## 2016-10-11 ENCOUNTER — Ambulatory Visit (INDEPENDENT_AMBULATORY_CARE_PROVIDER_SITE_OTHER): Payer: Medicare Other | Admitting: Family Medicine

## 2016-10-11 VITALS — BP 118/80 | HR 72 | Resp 16 | Ht 66.0 in | Wt 202.0 lb

## 2016-10-11 DIAGNOSIS — E1121 Type 2 diabetes mellitus with diabetic nephropathy: Secondary | ICD-10-CM

## 2016-10-11 DIAGNOSIS — G4733 Obstructive sleep apnea (adult) (pediatric): Secondary | ICD-10-CM | POA: Diagnosis not present

## 2016-10-11 DIAGNOSIS — N951 Menopausal and female climacteric states: Secondary | ICD-10-CM

## 2016-10-11 DIAGNOSIS — N183 Chronic kidney disease, stage 3 unspecified: Secondary | ICD-10-CM

## 2016-10-11 DIAGNOSIS — E785 Hyperlipidemia, unspecified: Secondary | ICD-10-CM | POA: Diagnosis not present

## 2016-10-11 DIAGNOSIS — I10 Essential (primary) hypertension: Secondary | ICD-10-CM | POA: Diagnosis not present

## 2016-10-11 DIAGNOSIS — F411 Generalized anxiety disorder: Secondary | ICD-10-CM

## 2016-10-11 DIAGNOSIS — E559 Vitamin D deficiency, unspecified: Secondary | ICD-10-CM | POA: Diagnosis not present

## 2016-10-11 DIAGNOSIS — K219 Gastro-esophageal reflux disease without esophagitis: Secondary | ICD-10-CM

## 2016-10-11 MED ORDER — ALPRAZOLAM 0.5 MG PO TABS: 0.5000 mg | ORAL_TABLET | Freq: Two times a day (BID) | ORAL | 4 refills | Status: DC

## 2016-10-11 NOTE — Patient Instructions (Addendum)
f/u in 4. 5 month, also wlllness visit with Albina Billet in 4.5 month  REPLENS is the lubricant recommended for dryness this is over the counter insert daily  HBA1C, lipid, cmp and eGFR , TSH and vit D  todaY  Thank you  for choosing [facility]. We consider it a privelige to serve you.  Delivering excellent health care in a caring and  compassionate way is our goal.  Partnering with you,  so that together we can achieve this goal is our strategy.   Please work on good  health habits so that your health will improve. 1. Commitment to daily physical activity for 30 to 60  minutes, if you are able to do this.  2. Commitment to wise food choices. Aim for half of your  food intake to be vegetable and fruit, one quarter starchy foods, and one quarter protein. Try to eat on a regular schedule  3 meals per day, snacking between meals should be limited to vegetables or fruits or small portions of nuts. 64 ounces of water per day is generally recommended, unless you have specific health conditions, like heart failure or kidney failure where you will need to limit fluid intake.  3. Commitment to sufficient and a  good quality of physical and mental rest daily, generally between 6 to 8 hours per day.  WITH PERSISTANCE AND PERSEVERANCE, THE IMPOSSIBLE , BECOMES THE NORM!

## 2016-10-12 LAB — COMPLETE METABOLIC PANEL WITH GFR
ALT: 21 U/L (ref 6–29)
AST: 25 U/L (ref 10–35)
Albumin: 4.4 g/dL (ref 3.6–5.1)
Alkaline Phosphatase: 122 U/L (ref 33–130)
BUN: 13 mg/dL (ref 7–25)
CO2: 28 mmol/L (ref 20–31)
Calcium: 10.5 mg/dL — ABNORMAL HIGH (ref 8.6–10.4)
Chloride: 100 mmol/L (ref 98–110)
Creat: 1.96 mg/dL — ABNORMAL HIGH (ref 0.60–0.93)
GFR, Est African American: 29 mL/min — ABNORMAL LOW (ref >=60)
GFR, Est Non African American: 25 mL/min — ABNORMAL LOW (ref >=60)
Glucose, Bld: 83 mg/dL (ref 65–99)
Potassium: 4.7 mmol/L (ref 3.5–5.3)
Sodium: 139 mmol/L (ref 135–146)
Total Bilirubin: 0.2 mg/dL (ref 0.2–1.2)
Total Protein: 9.1 g/dL — ABNORMAL HIGH (ref 6.1–8.1)

## 2016-10-12 LAB — TSH: TSH: 1.81 mIU/L

## 2016-10-12 LAB — HEMOGLOBIN A1C
Hgb A1c MFr Bld: 7.5 % — ABNORMAL HIGH (ref <5.7)
Mean Plasma Glucose: 169 mg/dL

## 2016-10-12 LAB — LIPID PANEL
Cholesterol: 178 mg/dL (ref <200)
HDL: 47 mg/dL — ABNORMAL LOW (ref >50)
LDL Cholesterol: 99 mg/dL (ref <100)
Total CHOL/HDL Ratio: 3.8 Ratio (ref <5.0)
Triglycerides: 162 mg/dL — ABNORMAL HIGH (ref <150)
VLDL: 32 mg/dL — ABNORMAL HIGH (ref <30)

## 2016-10-12 LAB — VITAMIN D 25 HYDROXY (VIT D DEFICIENCY, FRACTURES): Vit D, 25-Hydroxy: 28 ng/mL — ABNORMAL LOW (ref 30–100)

## 2016-10-14 DIAGNOSIS — N951 Menopausal and female climacteric states: Secondary | ICD-10-CM

## 2016-10-14 HISTORY — DX: Menopausal and female climacteric states: N95.1

## 2016-10-14 NOTE — Assessment & Plan Note (Signed)
Controlled, no change in medication  

## 2016-10-14 NOTE — Assessment & Plan Note (Signed)
Deteriorated refer to nephrology, re educate re avoidance of NSAIDS

## 2016-10-14 NOTE — Assessment & Plan Note (Signed)
Deteriorated. Patient re-educated about  the importance of commitment to a  minimum of 150 minutes of exercise per week.  The importance of healthy food choices with portion control discussed. Encouraged to start a food diary, count calories and to consider  joining a support group. Sample diet sheets offered. Goals set by the patient for the next several months.   Weight /BMI 10/11/2016 09/17/2016 07/06/2016  WEIGHT 202 lb 200 lb 202 lb  HEIGHT 5\' 6"  5' 6.5" 5\' 6"   BMI 32.6 kg/m2 31.8 kg/m2 32.6 kg/m2

## 2016-10-14 NOTE — Assessment & Plan Note (Signed)
Inconsistent use of CPAP, re educated re need to use every night

## 2016-10-14 NOTE — Assessment & Plan Note (Addendum)
Hyperlipidemia:Low fat diet discussed and encouraged.   Lipid Panel  Lab Results  Component Value Date   CHOL 178 10/11/2016   HDL 47 (L) 10/11/2016   LDLCALC 99 10/11/2016   TRIG 162 (H) 10/11/2016   CHOLHDL 3.8 10/11/2016    Needs to increase physical activity and reduce fat intake, no med change

## 2016-10-14 NOTE — Assessment & Plan Note (Signed)
Controlled, no change in medication DASH diet and commitment to daily physical activity for a minimum of 30 minutes discussed and encouraged, as a part of hypertension management. The importance of attaining a healthy weight is also discussed.  BP/Weight 10/11/2016 09/17/2016 07/06/2016 05/28/2016 04/11/2016 04/03/2016 7/89/7847  Systolic BP 841 282 081 388 719 597 471  Diastolic BP 80 84 78 79 82 86 78  Wt. (Lbs) 202 200 202 205 199 199.08 195  BMI 32.6 31.8 32.6 33.09 32.12 32.13 31.47

## 2016-10-14 NOTE — Assessment & Plan Note (Signed)
Deteriorated and uncontrolled needs to change diet and start tragenta Cheryl Abbott is reminded of the importance of commitment to daily physical activity for 30 minutes or more, as able and the need to limit carbohydrate intake to 30 to 60 grams per meal to help with blood sugar control.   The need to take medication as prescribed, test blood sugar as directed, and to call between visits if there is a concern that blood sugar is uncontrolled is also discussed.   Cheryl Abbott is reminded of the importance of daily foot exam, annual eye examination, and good blood sugar, blood pressure and cholesterol control.  Diabetic Labs Latest Ref Rng & Units 10/11/2016 09/17/2016 03/30/2016 03/07/2016 02/27/2016  HbA1c <5.7 % 7.5(H) - 6.4(H) - -  Microalbumin <2.0 mg/dL - - - - -  Micro/Creat Ratio 0.0 - 30.0 mg/g - - - - -  Chol <200 mg/dL 178 - 196 - -  HDL >50 mg/dL 47(L) - 54 - -  Calc LDL <100 mg/dL 99 - 118 - -  Triglycerides <150 mg/dL 162(H) - 118 - -  Creatinine 0.60 - 0.93 mg/dL 1.96(H) 1.41(H) 1.46(H) 1.46(H) 1.52(H)   BP/Weight 10/11/2016 09/17/2016 07/06/2016 05/28/2016 04/11/2016 04/03/2016 12/06/3660  Systolic BP 947 654 650 354 656 812 751  Diastolic BP 80 84 78 79 82 86 78  Wt. (Lbs) 202 200 202 205 199 199.08 195  BMI 32.6 31.8 32.6 33.09 32.12 32.13 31.47   Foot/eye exam completion dates Latest Ref Rng & Units 02/24/2016 07/27/2015  Eye Exam No Retinopathy - No Retinopathy  Foot exam Order - - -  Foot Form Completion - Done -      rept lab in 3.5 months

## 2016-10-14 NOTE — Assessment & Plan Note (Signed)
Use of Replens  (topical lubricant)and sexual activity recommended

## 2016-10-14 NOTE — Progress Notes (Signed)
Cheryl Abbott     MRN: [id number]      DOB: 1946-01-04   HPI Cheryl Abbott is here for follow up and re-evaluation of chronic medical conditions, medication management and review of any available recent lab and radiology data.  Preventive health is updated, specifically  Cancer screening and Immunization.   Questions or concerns regarding consultations or procedures which the PT has had in the interim are  addressed. The PT denies any adverse reactions to current medications since the last visit.  c/o vaginal dryness Uses her CPAP machine sporadically Not testing blood sugar regularly and reports eating a lot of "junk" including large servings of ice cream  ROS Denies recent fever or chills. Denies sinus pressure, nasal congestion, ear pain or sore throat. Denies chest congestion, productive cough or wheezing. Denies chest pains, palpitations and leg swelling Denies abdominal pain, nausea, vomiting,diarrhea or constipation.   Denies dysuria, nocturia, hesitancy or incontinence. Denies joint pain, swelling and limitation in mobility. Denies headaches, seizures, numbness, or tingling. Denies depression, anxiety or insomnia. Denies skin break down or rash.   PE  BP 118/80   Pulse 72   Resp 16   Ht 5\' 6"  (1.676 m)   Wt 202 lb (91.6 kg)   BMI 32.60 kg/m   Patient alert and oriented and in no cardiopulmonary distress.  HEENT: No facial asymmetry, EOMI,   oropharynx pink and moist.  Neck supple no JVD, no mass.  Chest: Clear to auscultation bilaterally.  CVS: S1, S2 no murmurs, no S3.Regular rate.  ABD: Soft non tender.   Ext: No edema  MS: Adequate ROM spine, shoulders, hips and knees.  Skin: Intact, no ulcerations or rash noted.  Psych: Good eye contact, normal affect. Memory intact not anxious or depressed appearing.  CNS: CN 2-12 intact, power,  normal throughout.no focal deficits noted.   Assessment & Plan  Type 2 diabetes with nephropathy  (HCC) Deteriorated and uncontrolled needs to change diet and start tragenta Cheryl Abbott is reminded of the importance of commitment to daily physical activity for 30 minutes or more, as able and the need to limit carbohydrate intake to 30 to 60 grams per meal to help with blood sugar control.   The need to take medication as prescribed, test blood sugar as directed, and to call between visits if there is a concern that blood sugar is uncontrolled is also discussed.   Cheryl Abbott is reminded of the importance of daily foot exam, annual eye examination, and good blood sugar, blood pressure and cholesterol control.  Diabetic Labs Latest Ref Rng & Units 10/11/2016 09/17/2016 03/30/2016 03/07/2016 02/27/2016  HbA1c <5.7 % 7.5(H) - 6.4(H) - -  Microalbumin <2.0 mg/dL - - - - -  Micro/Creat Ratio 0.0 - 30.0 mg/g - - - - -  Chol <200 mg/dL 178 - 196 - -  HDL >50 mg/dL 47(L) - 54 - -  Calc LDL <100 mg/dL 99 - 118 - -  Triglycerides <150 mg/dL 162(H) - 118 - -  Creatinine 0.60 - 0.93 mg/dL 1.96(H) 1.41(H) 1.46(H) 1.46(H) 1.52(H)   BP/Weight 10/11/2016 09/17/2016 07/06/2016 05/28/2016 04/11/2016 04/03/2016 3/90/3009  Systolic BP 233 007 622 633 354 562 563  Diastolic BP 80 84 78 79 82 86 78  Wt. (Lbs) 202 200 202 205 199 199.08 195  BMI 32.6 31.8 32.6 33.09 32.12 32.13 31.47   Foot/eye exam completion dates Latest Ref Rng & Units 02/24/2016 07/27/2015  Eye Exam No Retinopathy - No Retinopathy  Foot exam Order - - -  Foot Form Completion - Done -      rept lab in 3.5 months  Sleep apnea Inconsistent use of CPAP, re educated re need to use every night  Hypertension goal BP (blood pressure) < 130/80 Controlled, no change in medication DASH diet and commitment to daily physical activity for a minimum of 30 minutes discussed and encouraged, as a part of hypertension management. The importance of attaining a healthy weight is also discussed.  BP/Weight 10/11/2016 09/17/2016 07/06/2016 05/28/2016 04/11/2016  04/03/2016 01/11/2335  Systolic BP 122 449 753 005 110 211 173  Diastolic BP 80 84 78 79 82 86 78  Wt. (Lbs) 202 200 202 205 199 199.08 195  BMI 32.6 31.8 32.6 33.09 32.12 32.13 31.47       Hyperlipidemia LDL goal <100 Hyperlipidemia:Low fat diet discussed and encouraged.   Lipid Panel  Lab Results  Component Value Date   CHOL 178 10/11/2016   HDL 47 (L) 10/11/2016   LDLCALC 99 10/11/2016   TRIG 162 (H) 10/11/2016   CHOLHDL 3.8 10/11/2016    Needs to increase physical activity and reduce fat intake, no med change  Generalized anxiety disorder Controlled, no change in medication   CKD (chronic kidney disease) stage 3, GFR 30-59 ml/min Deteriorated refer to nephrology, re educate re avoidance of NSAIDS  Morbid obesity ([place]) Deteriorated. Patient re-educated about  the importance of commitment to a  minimum of 150 minutes of exercise per week.  The importance of healthy food choices with portion control discussed. Encouraged to start a food diary, count calories and to consider  joining a support group. Sample diet sheets offered. Goals set by the patient for the next several months.   Weight /BMI 10/11/2016 09/17/2016 07/06/2016  WEIGHT 202 lb 200 lb 202 lb  HEIGHT 5\' 6"  5' 6.5" 5\' 6"   BMI 32.6 kg/m2 31.8 kg/m2 32.6 kg/m2      Vaginal dryness, menopausal Use of Replens  (topical lubricant)and sexual activity recommended  GERD Controlled, no change in medication

## 2016-10-17 DIAGNOSIS — R0902 Hypoxemia: Secondary | ICD-10-CM | POA: Diagnosis not present

## 2016-10-18 ENCOUNTER — Ambulatory Visit: Payer: Medicare Other | Admitting: Gastroenterology

## 2016-10-19 ENCOUNTER — Telehealth: Payer: Self-pay | Admitting: Gastroenterology

## 2016-10-19 ENCOUNTER — Encounter: Payer: Self-pay | Admitting: Gastroenterology

## 2016-10-19 NOTE — Telephone Encounter (Signed)
PATIENT WAS A NO SHOW AND LETTER SENT  °

## 2016-10-19 NOTE — Telephone Encounter (Signed)
REVIEWED-NO ADDITIONAL RECOMMENDATIONS. 

## 2016-10-22 ENCOUNTER — Telehealth: Payer: Self-pay

## 2016-10-22 NOTE — Telephone Encounter (Signed)
[name] called returning your call. # [phone]

## 2016-10-23 NOTE — Telephone Encounter (Signed)
Tried to call back and no answer. See result message. Have made several attempts to call her back

## 2016-10-23 NOTE — Telephone Encounter (Signed)
Called patient and left message.

## 2016-10-24 ENCOUNTER — Other Ambulatory Visit: Payer: Self-pay

## 2016-10-24 MED ORDER — LINAGLIPTIN 5 MG PO TABS: 5.0000 mg | ORAL_TABLET | Freq: Every day | ORAL | 5 refills | Status: DC

## 2016-10-24 NOTE — Telephone Encounter (Signed)
Left message per patient on her voicemail as to why I was calling. Advised her to call back if she had any questions about her med change

## 2016-10-26 IMAGING — MR MR LUMBAR SPINE W/O CM
4 of 5 series · 15 of 48 positions shown · non-contrast
Comparison: 06/08/2011 CT.

CLINICAL DATA: 68-year-old female with low back pain left leg
weakness for the past 6 months. No known injury. Initial encounter.

EXAM:
MRI LUMBAR SPINE WITHOUT CONTRAST
TECHNIQUE: Multiplanar, multisequence MR imaging of the lumbar spine was
performed. No intravenous contrast was administered.

[Series 2: T2 · sagittal · 4.0mm · 0.67mm/px · 6 of 15 slices shown (1 of 2)]
[im 1/15]
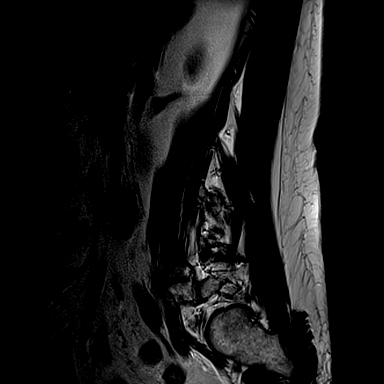
[im 3/15]
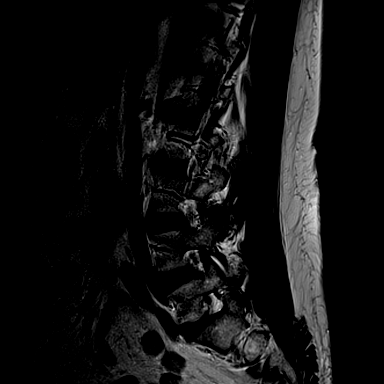
[im 6/15]
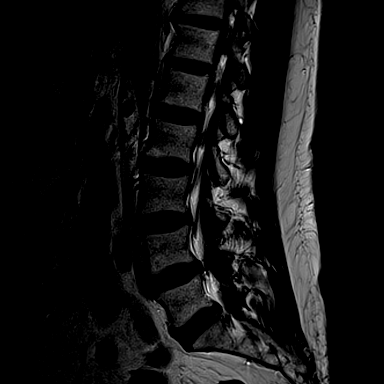
[im 9/15]
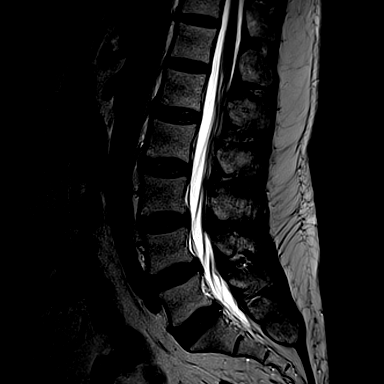
[im 12/15]
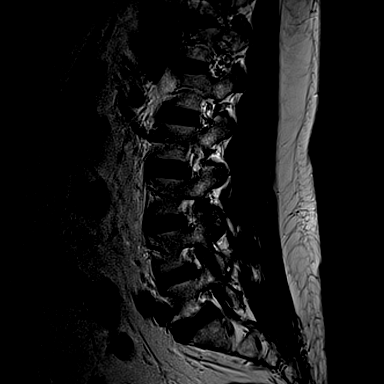
[im 15/15]
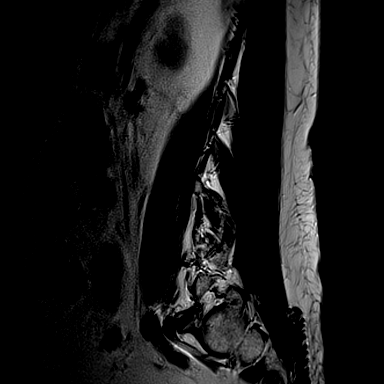

[Series 3: T1 · sagittal · 4.0mm · 0.37mm/px · 3 of 15 slices shown (1 of 2)]
[im 3/15]
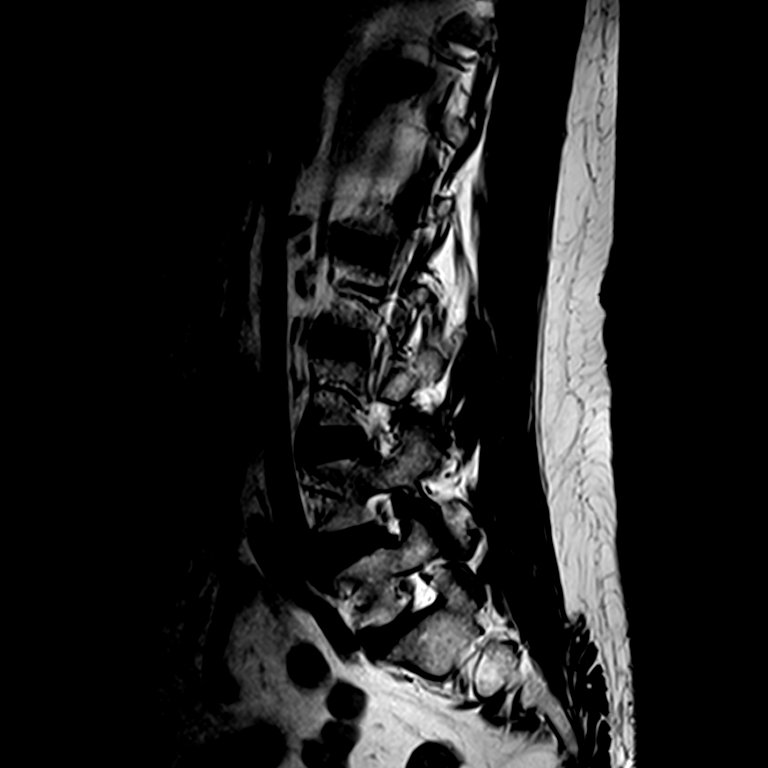
[im 9/15]
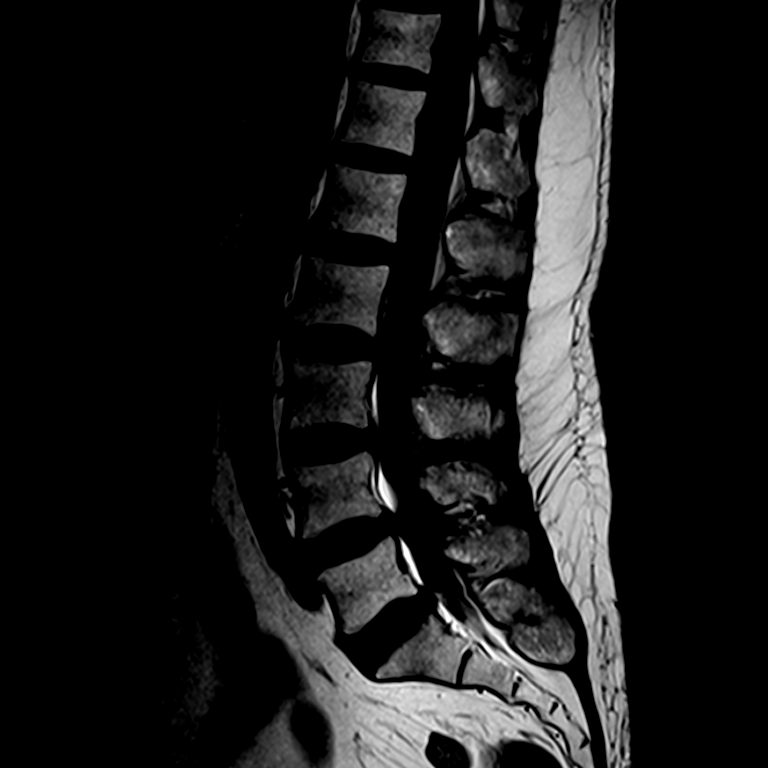
[im 15/15]
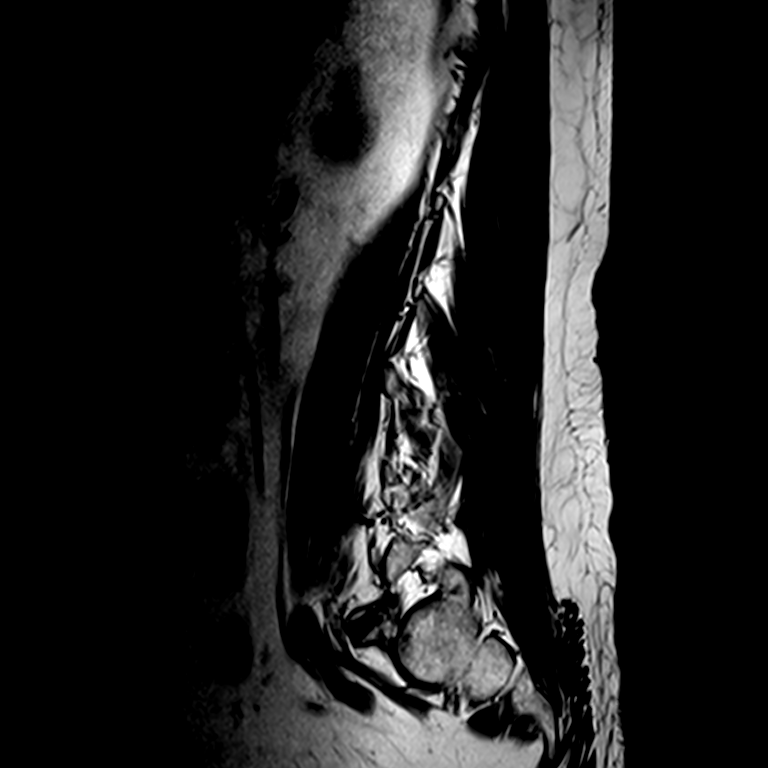

[Series 5: T2 · axial · 4.0mm · 0.24mm/px · z∈[-73,+62]mm · 3 of 39 slices shown (2 of 2)]
[im 6/39]
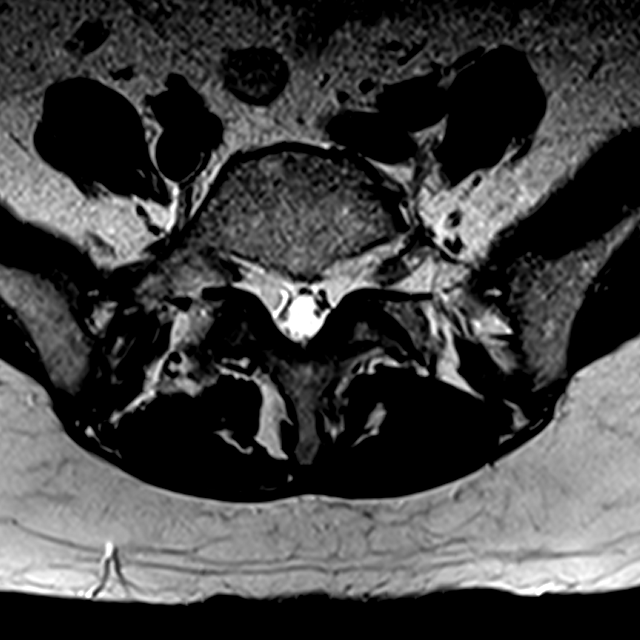
[im 20/39]
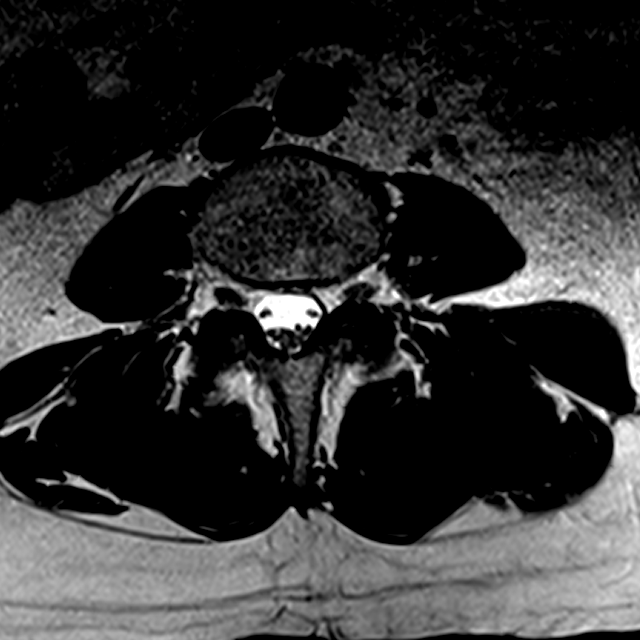
[im 33/39]
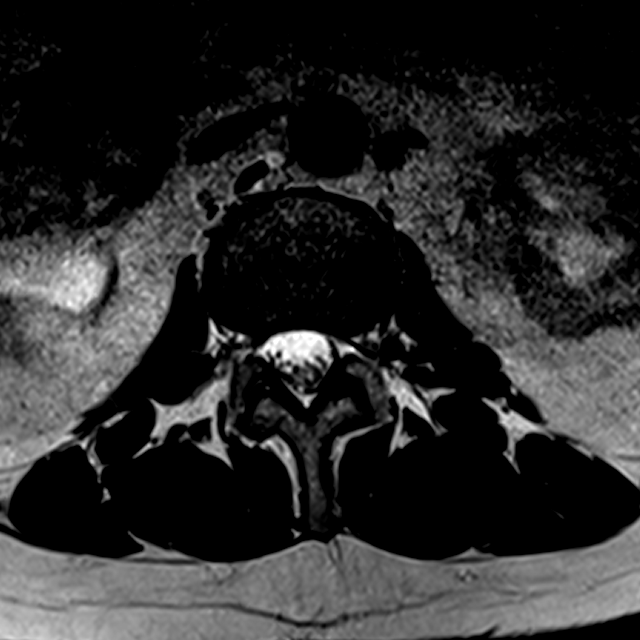

[Series 6: T1 · axial · 4.0mm · 0.23mm/px · z∈[-73,+62]mm · 3 of 39 slices shown (2 of 2)]
[im 6/39]
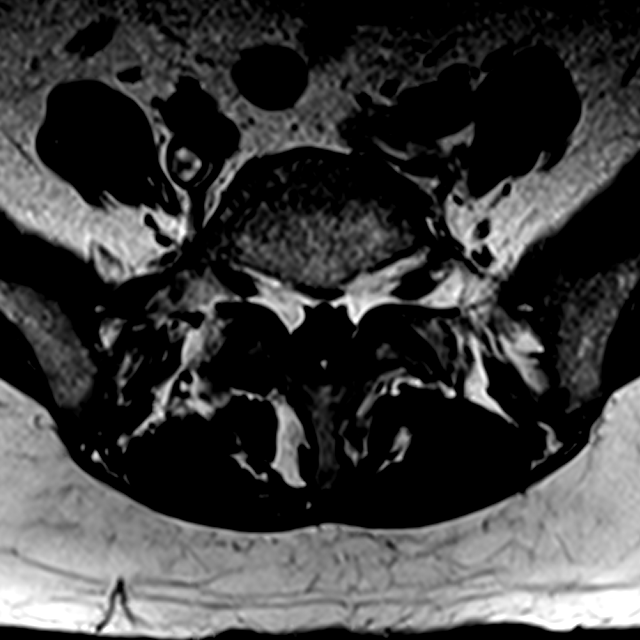
[im 20/39]
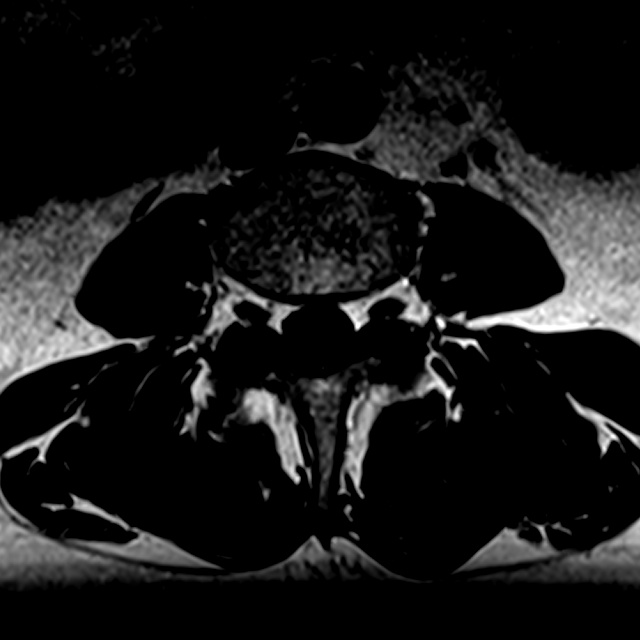
[im 33/39]
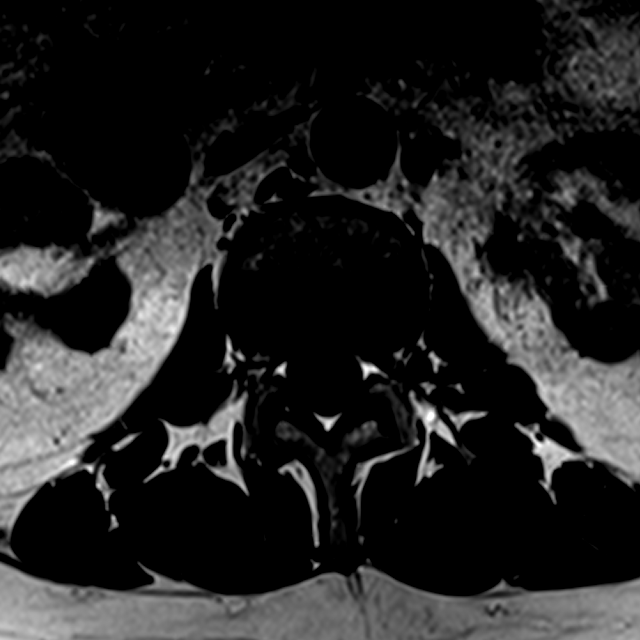

[15 of 48 positions shown; findings below may reference images not displayed]

FINDINGS: Last fully open disk space is labeled L5-S1. Present examination
incorporates from T11-12 disc space through lower sacrum.

Conus lower L1 level.

Visualized paravertebral structures unremarkable.

T11-12 and T12-L1 unremarkable.

L1-2: Minimal anterior osteophyte.

L2-3: Minimal anterior osteophyte.

L3-4:  Mild facet joint degenerative changes.

L4-5: Moderate facet joint degenerative changes. Minimal to mild
bulge. Very mild spinal stenosis. Additionally, there is a 3 mm
right-sided synovial cyst immediately adjacent to the origin of the
right L5 nerve root which is not compressed.

L5-S1: Mild facet joint degenerative changes. Bulge with tiny
annular fissure centrally. Left lateral prominent osteophyte with
slight encroachment upon but not significant compression of the
exiting left L5 nerve root.
IMPRESSION: Summary of pertinent findings includes:

L4-5 moderate facet joint degenerative changes. Minimal to mild
bulge. Very mild spinal stenosis. Additionally, there is a 3 mm
right-sided synovial cyst immediately adjacent to the origin of the
right L5 nerve root which is not compressed.

L5-S1 bulge with tiny annular fissure centrally. Left lateral
prominent osteophyte with slight encroachment upon but not
significant compression of the exiting left L5 nerve root.

## 2016-10-27 IMAGING — CT CT ABD-PELV W/ CM
2 of 5 series · 16 of 46 positions shown, 18 images · IV contrast (Omnipaque 300)
Comparison: None.

CLINICAL DATA: Pelvic pain for 2 weeks. History of renal stones.
History of diabetes and hypertension.

EXAM:
CT ABDOMEN AND PELVIS WITH CONTRAST
TECHNIQUE: Multidetector CT imaging of the abdomen and pelvis was performed
using the standard protocol following bolus administration of
intravenous contrast.
CONTRAST:  100mL OMNIPAQUE IOHEXOL 300 MG/ML  SOLN

[Series 2: abd_pel_with 5.0 b40f · axial · 0.75mm/px · z∈[-472,-48]mm · 13 of 97 slices shown, 15 images]
[im 6/97  soft-tissue]
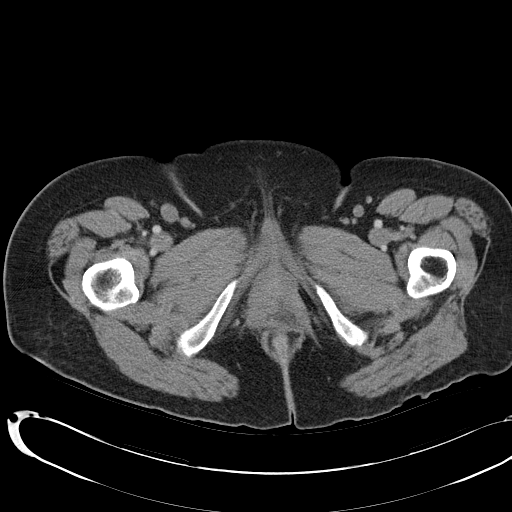
[im 6/97  bone]
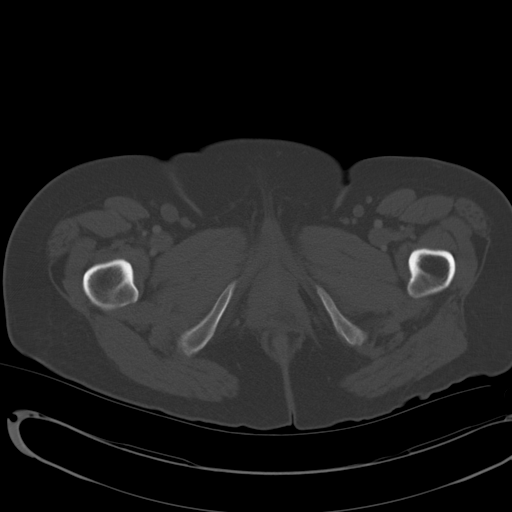
[im 11/97  soft-tissue]
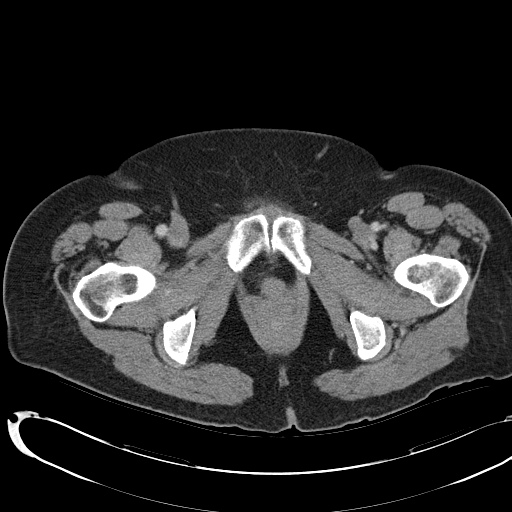
[im 22/97  soft-tissue]
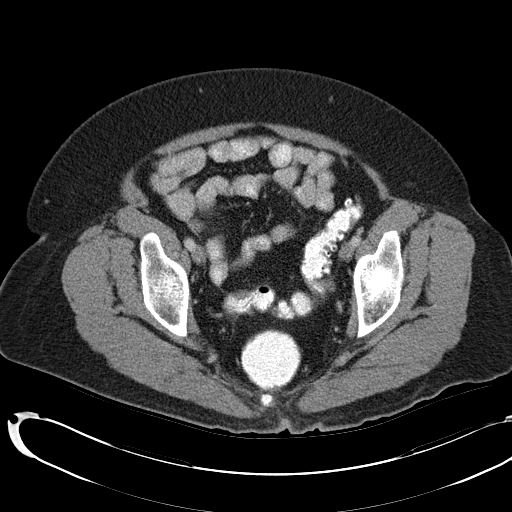
[im 27/97  soft-tissue]
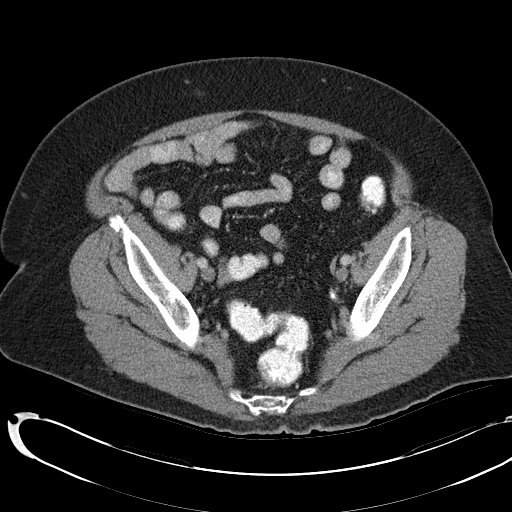
[im 33/97  soft-tissue]
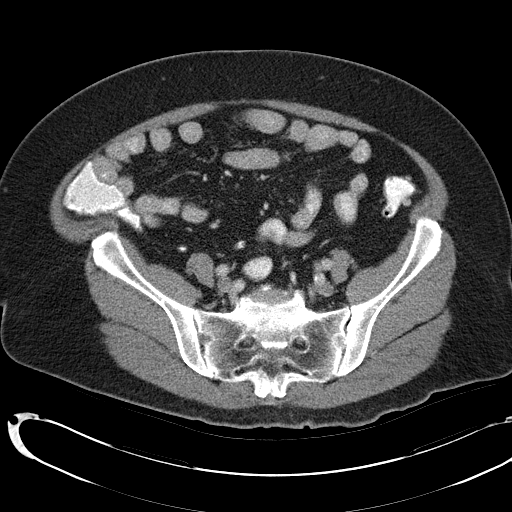
[im 43/97  soft-tissue]
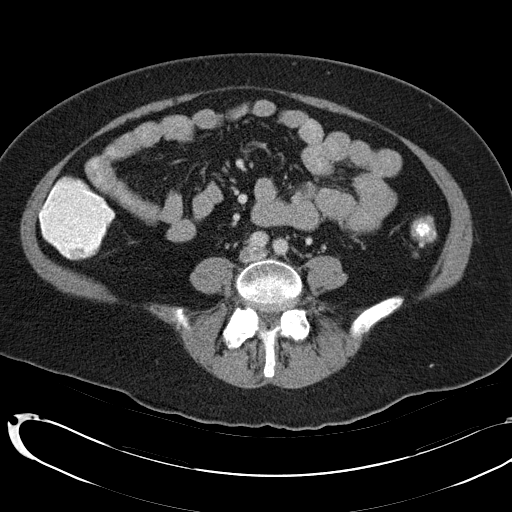
[im 49/97  soft-tissue]
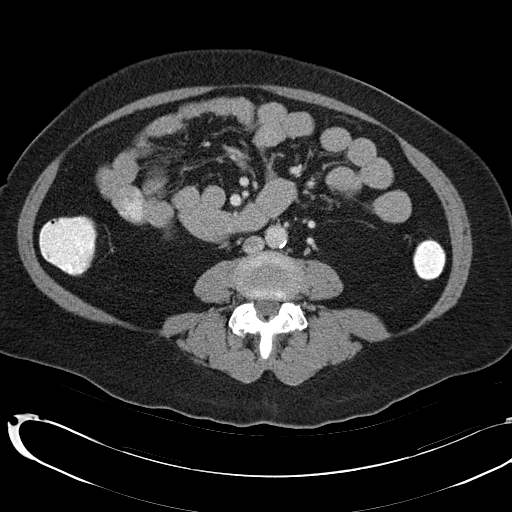
[im 54/97  soft-tissue]
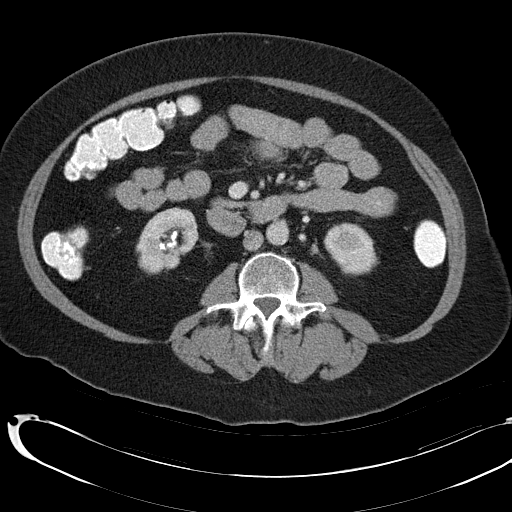
[im 65/97  soft-tissue]
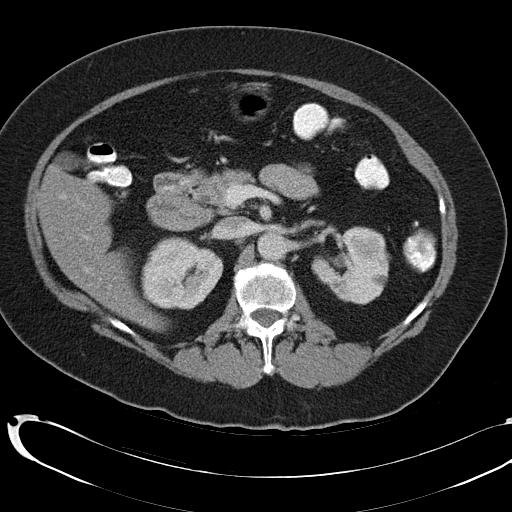
[im 65/97  bone]
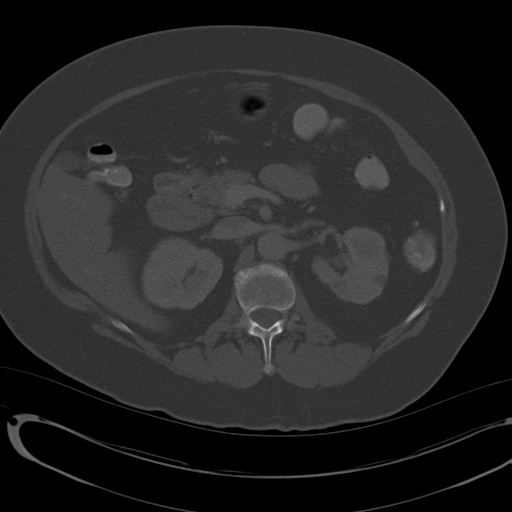
[im 70/97  soft-tissue]
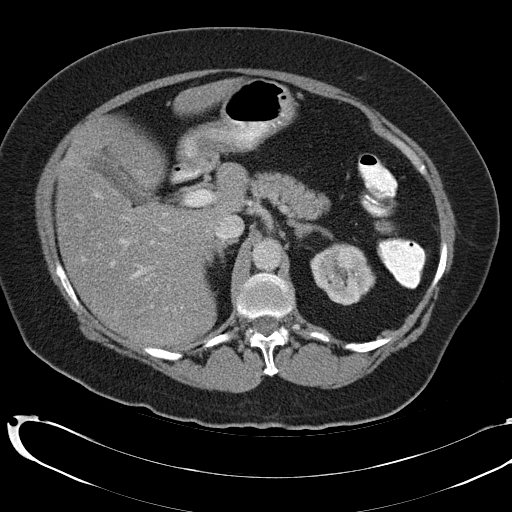
[im 75/97  soft-tissue]
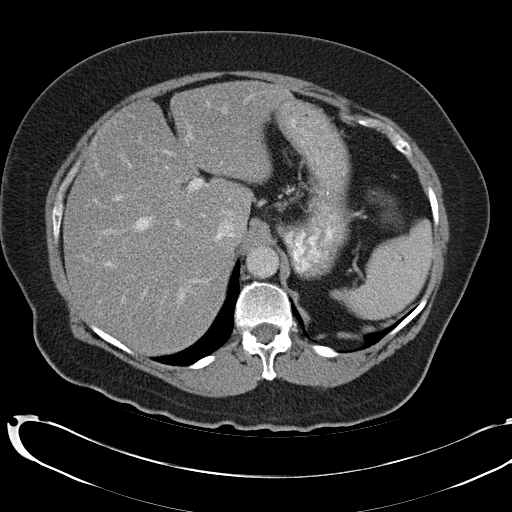
[im 86/97  soft-tissue]
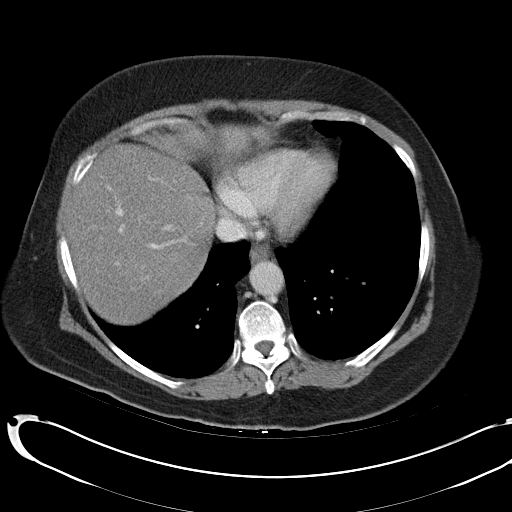
[im 91/97  soft-tissue]
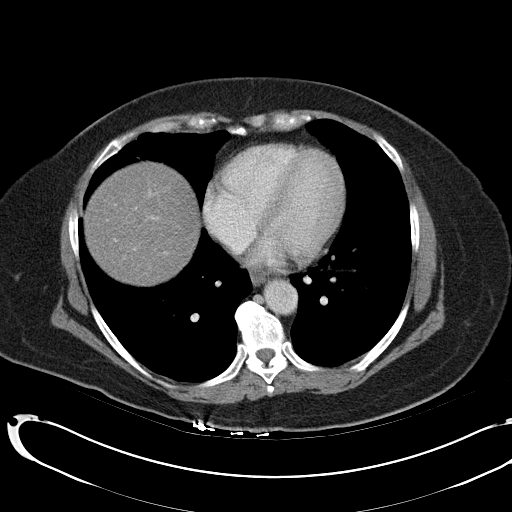

[Series 5: abd_pel_with 3.0 spo cor · coronal · 0.77mm/px · 3 of 99 slices shown]
[im 33/99  soft-tissue]
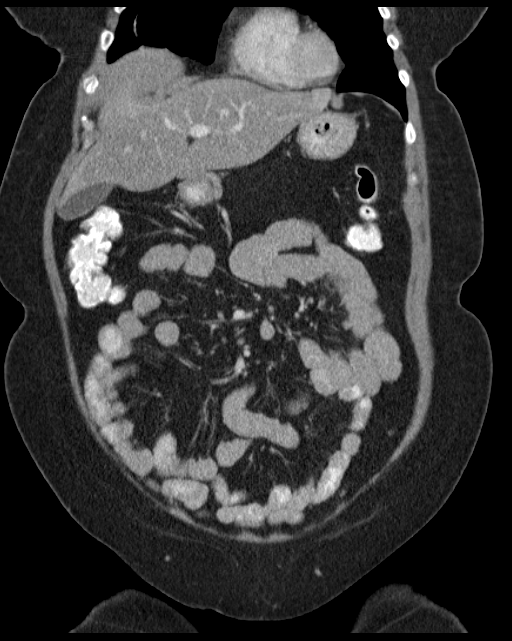
[im 44/99  soft-tissue]
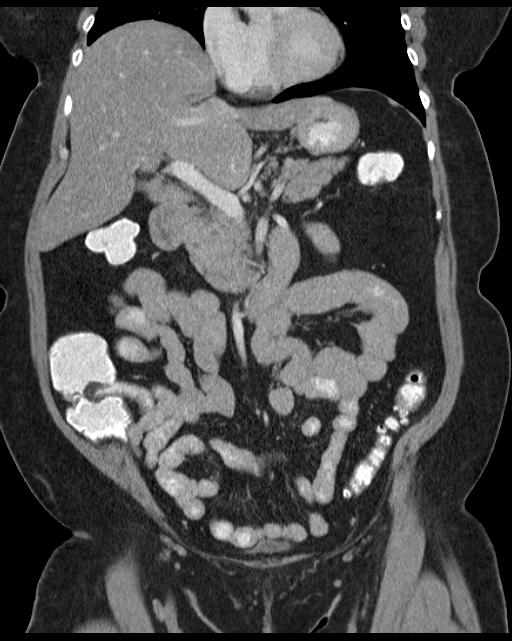
[im 55/99  soft-tissue]
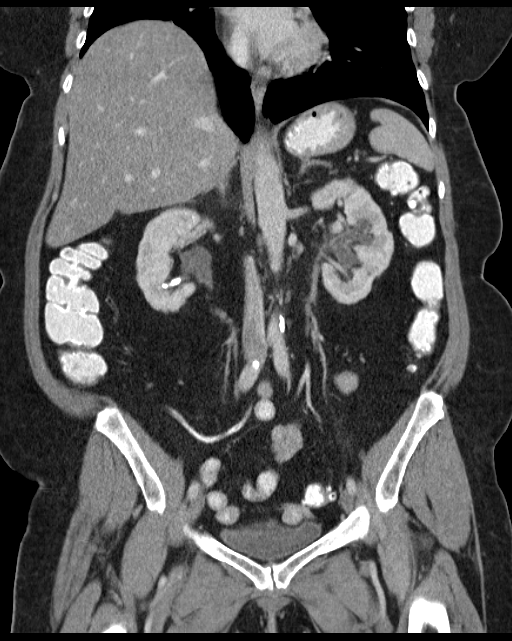

[16 of 46 positions shown; findings below may reference images not displayed]

FINDINGS: A 6 mm stone lies in left renal pelvis. There is mild left renal
collecting system dilation and stranding in the peripelvic fat. The
left ureter is normal course and caliber. There is a 3 mm stone in
the distal ureter.

Both kidneys show small nonobstructing intrarenal stones. A 12 mm
cyst arises from the lateral mid to upper pole of the left kidney.
No other renal masses. Bilateral renal cortical thinning is noted.
No right ureteral stone or hydronephrosis. No bladder mass or stone.

Mild subsegmental atelectasis at the base of the right middle lobe.
Heart is normal in size.

Diffuse fatty infiltration of the liver. No liver mass or focal
lesion.

Spleen, gallbladder, pancreas, adrenal glands: Unremarkable.

Uterus is surgically absent.  No pelvic masses.

Left colon diverticula, mostly along the sigmoid colon. No
diverticulitis. The colon otherwise unremarkable. Normal small
bowel. Normal appendix.

No adenopathy.  No ascites.

No osteoblastic or osteolytic lesions.
IMPRESSION: 1. 6 mm stone in the left renal pelvis associated with mild left
hydronephrosis and mild stranding in the peripelvic fat. There is
also a 3 mm stone in the distal left ureter without ureteral
dilation. The renal pelvis stone may be causing intermittent
obstruction at the level of the ureteropelvic junction or the mild
obstruction may be due to the distal ureteral stone. No other acute
findings.
2. Small bilateral nonobstructing intrarenal stones. Diffuse
bilateral cortical thinning. Small left renal cyst.
3. Hepatic steatosis.
4. Status post hysterectomy.
5. Left colon diverticulosis.

## 2016-11-12 ENCOUNTER — Other Ambulatory Visit (HOSPITAL_COMMUNITY): Payer: Self-pay | Admitting: Nephrology

## 2016-11-12 DIAGNOSIS — N200 Calculus of kidney: Secondary | ICD-10-CM | POA: Diagnosis not present

## 2016-11-12 DIAGNOSIS — N179 Acute kidney failure, unspecified: Secondary | ICD-10-CM | POA: Diagnosis not present

## 2016-11-12 DIAGNOSIS — N183 Chronic kidney disease, stage 3 (moderate): Secondary | ICD-10-CM | POA: Diagnosis not present

## 2016-11-12 DIAGNOSIS — N289 Disorder of kidney and ureter, unspecified: Secondary | ICD-10-CM

## 2016-11-13 ENCOUNTER — Ambulatory Visit (HOSPITAL_COMMUNITY)
Admission: RE | Admit: 2016-11-13 | Discharge: 2016-11-13 | Disposition: A | Discharge status: Home / Self Care | Payer: Medicare Other | Source: Ambulatory Visit | Attending: Nephrology | Admitting: Nephrology

## 2016-11-13 DIAGNOSIS — N289 Disorder of kidney and ureter, unspecified: Secondary | ICD-10-CM | POA: Insufficient documentation

## 2016-11-13 DIAGNOSIS — N200 Calculus of kidney: Secondary | ICD-10-CM

## 2016-11-17 DIAGNOSIS — R0902 Hypoxemia: Secondary | ICD-10-CM | POA: Diagnosis not present

## 2016-11-28 ENCOUNTER — Ambulatory Visit: Payer: Medicare Other | Admitting: Nurse Practitioner

## 2016-11-28 ENCOUNTER — Encounter: Payer: Self-pay | Admitting: Nurse Practitioner

## 2016-11-28 ENCOUNTER — Telehealth: Payer: Self-pay | Admitting: Nurse Practitioner

## 2016-11-28 NOTE — Progress Notes (Deleted)
Referring Provider: [name], MD Primary Care Physician:  [name], MD Primary GI:  Dr. Oneida Alar  No chief complaint on file.   HPI:   Cheryl Abbott is a 71 y.o. female who presents to schedule a colonoscopy/EGD. Patient was last seen in our office 02/01/2016. At that time occasional dysphagia, occasional emesis when she becomes upset. Chronic constipation and uses magnesium citrate to "clean out." Recommended lactase pills with dairy. Recommended modified barium swallow. Follow-up in 6 months. The patient called to cancel her modified barium swallow is "she has a lot going on right now." She was a no-show at her last visit scheduled for 10/19/2016.  Prior to that she was seen for iron deficiency anemia with possible colonoscopy and capsule endoscopy pending lab results. It does not appear that labs are drawn. Noted chronic baseline anemia (since 10/2014), last ferritin was 11 on 08/19/2015.  She was admitted to [facility] 02/25/16 - 02/27/16 for TIA which resolved on it's own and recommended follow-up with PCP and Neurology. She appeared to recover well, on ASA and Plavix.  Last colonoscopy completed 10/02/2011 which found good prep with 2 day prep, completed on propofol/MAC. Findings included moderate sigmoid colonic diverticulosis, hemorrhoids. Recommended high-fiber diet, outpatient follow-up. No repeat exam.  It appears another colonoscopy was scheduled for February 2017 and had multiple cancellations including a final attempt on 08/08/2015 due to being sick. Noted she would call back when she feels better. No rescheduling occurred.  Last EGD completed 06/10/2015. Findings included a distal gastritis, distal esophageal stricture status post dilation. Recommend follow low-fat diet, follow-up in April 2017. Biopsies of gastritis were taken. Surgical pathology found the biopsies to be reactive gastropathy and benign fundic gland polyp negative for H.  pylori.  Today she states   Past Medical History:  Diagnosis Date  . Anemia   . Anxiety   . Anxiety disorder   . Arthritis   . 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 Chronic back pain   . Depression   . Fatty liver   . GERD (gastroesophageal reflux disease)   . Headache   . History of adenomatous polyp of colon    2008  . History of kidney stones   . Hypercalcemia   . Hyperlipidemia   . Hypertension   . Left ureteral stone   . Lipoma of arm 01/19/2015  . Lumbar disc disease with radiculopathy   . Nephrolithiasis   . Neuropathy, peripheral   . OSA treated with BiPAP    per study 2007  . Sciatica   . Sigmoid diverticulosis   . Type 2 diabetes mellitus ([place])     Past Surgical History:  Procedure Laterality Date  . BIOPSY N/A 03/17/2013   Procedure: GASTRIC BIOPSIES;  Surgeon: Danie Binder, MD;  Location: [facility];  Service: Endoscopy;  Laterality: N/A;  . BIOPSY  06/10/2015   Procedure: BIOPSY;  Surgeon: Danie Binder, MD;  Location: [facility];  Service: Endoscopy;;  gastric biopsy  . CARDIAC CATHETERIZATION  05-11-2003  dr Shelva Majestic ([facility])   Abnormal cardiolite/   Normal coronary arteries and normal LVF,  ef  63%  . CARDIOVASCULAR STRESS TEST  01-01-2014   normal lexiscan cardiolite/  no ischemia/ infarct/  normal LV function and wall motion ,  ef 81%  . COLONOSCOPY  last one 10/02/2011   [facility] [place] TICS, IH, NEXT TCS APR 2018  . CYSTO/   RIGHT URETEROSOCOPY LASER LITHOTRIPSY STONE  EXTRACTION  10-13-2004  . CYSTO/  RIGHT RETROGRADE PYELOGRAM/  PLACMENT RIGHT URETERAL STENT  01-10-2010  . CYSTOSCOPY W/ URETERAL STENT PLACEMENT Right 08/19/2015   Procedure: CYSTOSCOPY WITH RETROGRADE PYELOGRAM/URETERAL STENT PLACEMENT;  Surgeon: Franchot Gallo, MD;  Location: [facility];  Service: Urology;  Laterality: Right;  . CYSTOSCOPY WITH RETROGRADE PYELOGRAM, URETEROSCOPY AND STENT PLACEMENT Left 10/21/2014   Procedure: CYSTOSCOPY WITH RETROGRADE PYELOGRAM,  URETEROSCOPY AND STENT PLACEMENT;  Surgeon: Franchot Gallo, MD;  Location[facility];  Service: Urology;  Laterality: Left;  . CYSTOSCOPY WITH RETROGRADE PYELOGRAM, URETEROSCOPY AND STENT PLACEMENT Right 11/22/2015   Procedure: CYSTOSCOPY, RIGHT URETERAL STENT REMOVAL; RIGHT RETROGRADE PYELOGRAM, RIGHT URETEROSCOPY WITH BALLOON DILATION; RIGHT URETERAL STENT PLACEMENT;  Surgeon: Franchot Gallo, MD;  Location: [facility];  Service: Urology;  Laterality: Right;  . CYSTOSCOPY/RETROGRADE/URETEROSCOPY/STONE EXTRACTION WITH BASKET Bilateral 08/02/2015   Procedure: CYSTOSCOPY; BILATERAL RETROGRADE PYELOGRAMS; BILATERAL URETEROSCOPY, STONE EXTRACTION WITH BASKET;  Surgeon: Franchot Gallo, MD;  Location: [facility];  Service: Urology;  Laterality: Bilateral;  . CYSTOSCOPY/RETROGRADE/URETEROSCOPY/STONE EXTRACTION WITH BASKET Right 06/28/2015   Procedure: CYSTOSCOPY/RETROGRADE/URETEROSCOPY/STONE EXTRACTION WITH BASKET, RIGHT URETERAL DOUBLE J STENT PLACEMENT;  Surgeon: Franchot Gallo, MD;  Location: [facility];  Service: Urology;  Laterality: Right;  . ESOPHAGOGASTRODUODENOSCOPY (EGD) WITH PROPOFOL N/A 03/17/2013   Procedure: ESOPHAGOGASTRODUODENOSCOPY (EGD) WITH PROPOFOL;  Surgeon: Danie Binder, MD;  Location: [facility];  Service: Endoscopy;  Laterality: N/A;  . ESOPHAGOGASTRODUODENOSCOPY (EGD) WITH PROPOFOL N/A 06/10/2015   Procedure: ESOPHAGOGASTRODUODENOSCOPY (EGD) WITH PROPOFOL;  Surgeon: Danie Binder, MD;  [facility];  Service: Endoscopy;  Laterality: N/A;  1245 - pt knows to arrive at 8:30  . FLEXIBLE SIGMOIDOSCOPY  09/11/2011   RWE:[id number] Internal hemorrhoids  . HOLMIUM LASER APPLICATION Left 7/61/9509   Procedure: HOLMIUM LASER APPLICATION;  Surgeon: Franchot Gallo, MD;  Location: [facility];  Service: Urology;  Laterality: Left;  . HOLMIUM LASER APPLICATION Right 09/04/7122   Procedure: HOLMIUM LASER APPLICATION;  Surgeon: Franchot Gallo, MD;  Location:  [facility];  Service: Urology;  Laterality: Right;  . MASS EXCISION Left 03/04/2015   Procedure: EXCISION OF SOFT TISSUE NEOPLASM LEFT ARM;  Surgeon: Aviva Signs, MD;  Location: [facility];  Service: General;  Laterality: Left;  . PERCUTANEOUS NEPHROSTOLITHOTOMY Bilateral 12/  2011     [facility]  . POLYPECTOMY N/A 03/17/2013   Procedure: GASTRIC POLYPECTOMY;  Surgeon: Danie Binder, MD;  Location: [facility];  Service: Endoscopy;  Laterality: N/A;  . REMOVAL RIGHT THIGH CYST  2006  . SAVORY DILATION N/A 03/17/2013   Procedure: SAVORY DILATION;  Surgeon: Danie Binder, MD;  Location: [facility];  Service: Endoscopy;  Laterality: N/A;  #12.8, 14, 15, 16 dilators used  . SAVORY DILATION N/A 06/10/2015   Procedure: SAVORY DILATION;  Surgeon: Danie Binder, MD;  [facility];  Service: Endoscopy;  Laterality: N/A;  . TRANSTHORACIC ECHOCARDIOGRAM  01-01-2014   mild LVH/  ef 58-09%/  grade I diastolic dysfunction/  trivial MR, TR, and PR  . VAGINAL HYSTERECTOMY  1970's    Current Outpatient Prescriptions  Medication Sig Dispense Refill  . ALPRAZolam (XANAX) 0.5 MG tablet Take 1 tablet (0.5 mg total) by mouth 2 (two) times daily. 60 tablet 4  . amLODipine (NORVASC) 10 MG tablet TAKE 1 TABLET (10 MG TOTAL) BY MOUTH DAILY. 90 tablet 1  . aspirin EC 325 MG tablet Take 1 tablet (325 mg total) by mouth daily. 60 tablet 0  . betamethasone dipropionate (DIPROLENE) 0.05 %  cream Apply sparingly twice daily to rash  For 7 to 10 days only (Patient taking differently: Apply topically daily as needed. Apply sparingly twice daily to rash  For 7 to 10 days only) 45 g 0  . diphenhydramine-acetaminophen (TYLENOL PM) 25-500 MG TABS tablet Take 2 tablets by mouth at bedtime.    . docusate sodium (COLACE) 100 MG capsule Take 100 mg by mouth at bedtime.     Marland Kitchen linagliptin (TRADJENTA) 5 MG TABS tablet Take 1 tablet (5 mg total) by mouth daily. 30 tablet 5  . lovastatin (MEVACOR) 40 MG tablet TAKE 2 TABLETS (80 MG TOTAL) BY  MOUTH AT BEDTIME. 180 tablet 1  . pantoprazole (PROTONIX) 40 MG tablet TAKE 1 TABLET (40 MG TOTAL) BY MOUTH 2 (TWO) TIMES DAILY. 180 tablet 1  . PARoxetine (PAXIL) 40 MG tablet TAKE 1 TABLET (40 MG TOTAL) BY MOUTH EVERY MORNING. 90 tablet 0   No current facility-administered medications for this visit.     Allergies as of 11/28/2016 - Review Complete 10/14/2016  Allergen Reaction Noted  . Ace inhibitors Cough 12/11/2010  . Keflex [cephalexin] Diarrhea and Nausea And Vomiting 07/24/2011  . Nitrofurantoin Diarrhea and Nausea And Vomiting 09/08/2009  . Penicillins Hives and Other (See Comments) 09/17/2007    Family History  Problem Relation Age of Onset  . Hypertension Mother   . Diabetes Mother   . Heart failure Mother   . Dementia Mother   . Emphysema Father   . Hypertension Father   . Diabetes Brother   . GER disease Brother   . Hypertension Sister   . Hypertension Sister   . Cancer Unknown        family history   . Diabetes Unknown        family history   . Heart defect Unknown        famiily history   . Arthritis Unknown        family history   . Anesthesia problems Neg Hx   . Hypotension Neg Hx   . Malignant hyperthermia Neg Hx   . Pseudochol deficiency Neg Hx   . Colon cancer Neg Hx     Social History   Social History  . Marital status: Divorced    Spouse name: N/A  . Number of children: 2  . Years of education: N/A   Occupational History  . retired     Social History Main Topics  . Smoking status: Former Smoker    Packs/day: 1.00    Years: 20.00    Types: Cigarettes    Start date: 09/21/1974    Quit date: 09/21/1994  . Smokeless tobacco: Never Used     Comment: Quit x 20 years  . Alcohol use No  . Drug use: No  . Sexual activity: No   Other Topics Concern  . Not on file   Social History Narrative   Patient is right handed   Patient drinks some caffeine daily.    Review of Systems: General: Negative for anorexia, weight loss, fever, chills,  fatigue, weakness. Eyes: Negative for vision changes.  ENT: Negative for hoarseness, difficulty swallowing , nasal congestion. CV: Negative for chest pain, angina, palpitations, dyspnea on exertion, peripheral edema.  Respiratory: Negative for dyspnea at rest, dyspnea on exertion, cough, sputum, wheezing.  GI: See history of present illness. GU:  Negative for dysuria, hematuria, urinary incontinence, urinary frequency, nocturnal urination.  MS: Negative for joint pain, low back pain.  Derm: Negative for rash or itching.  Neuro: Negative for weakness, abnormal sensation, seizure, frequent headaches, memory loss, confusion.  Psych: Negative for anxiety, depression, suicidal ideation, hallucinations.  Endo: Negative for unusual weight change.  Heme: Negative for bruising or bleeding. Allergy: Negative for rash or hives.   Physical Exam: There were no vitals taken for this visit. General:   Alert and oriented. Pleasant and cooperative. Well-nourished and well-developed.  Head:  Normocephalic and atraumatic. Eyes:  Without icterus, sclera clear and conjunctiva pink.  Ears:  Normal auditory acuity. Mouth:  No deformity or lesions, oral mucosa pink.  Throat/Neck:  Supple, without mass or thyromegaly. Cardiovascular:  S1, S2 present without murmurs appreciated. Normal pulses noted. Extremities without clubbing or edema. Respiratory:  Clear to auscultation bilaterally. No wheezes, rales, or rhonchi. No distress.  Gastrointestinal:  +BS, soft, non-tender and non-distended. No HSM noted. No guarding or rebound. No masses appreciated.  Rectal:  Deferred  Musculoskalatal:  Symmetrical without gross deformities. Normal posture. Skin:  Intact without significant lesions or rashes. Neurologic:  Alert and oriented x4;  grossly normal neurologically. Psych:  Alert and cooperative. Normal mood and affect. Heme/Lymph/Immune: No significant cervical adenopathy. No excessive bruising  noted.    11/28/2016 1:22 PM   Disclaimer: This note was dictated with voice recognition software. Similar sounding words can inadvertently be transcribed and may not be corrected upon review.

## 2016-11-28 NOTE — Telephone Encounter (Signed)
Noted  

## 2016-11-28 NOTE — Telephone Encounter (Signed)
PATIENT WAS A NO SHOW AND LETTER SENT  °

## 2016-11-29 NOTE — Telephone Encounter (Signed)
Patient overslept and missed her appt.  She states that when she sits down and gets comfortable she "knocks out".  Please make sure we leave a msg on her mobile phone([phone]) in regards to her appt.

## 2016-12-06 ENCOUNTER — Other Ambulatory Visit: Payer: Self-pay | Admitting: Family Medicine

## 2016-12-07 DIAGNOSIS — Z79899 Other long term (current) drug therapy: Secondary | ICD-10-CM | POA: Diagnosis not present

## 2016-12-07 DIAGNOSIS — Z1159 Encounter for screening for other viral diseases: Secondary | ICD-10-CM | POA: Diagnosis not present

## 2016-12-07 DIAGNOSIS — R809 Proteinuria, unspecified: Secondary | ICD-10-CM | POA: Diagnosis not present

## 2016-12-07 DIAGNOSIS — E559 Vitamin D deficiency, unspecified: Secondary | ICD-10-CM | POA: Diagnosis not present

## 2016-12-07 DIAGNOSIS — D509 Iron deficiency anemia, unspecified: Secondary | ICD-10-CM | POA: Diagnosis not present

## 2016-12-07 DIAGNOSIS — N183 Chronic kidney disease, stage 3 (moderate): Secondary | ICD-10-CM | POA: Diagnosis not present

## 2016-12-07 NOTE — Telephone Encounter (Signed)
seen 5 3 18

## 2016-12-10 ENCOUNTER — Encounter (HOSPITAL_COMMUNITY): Payer: Self-pay

## 2016-12-10 ENCOUNTER — Emergency Department (HOSPITAL_COMMUNITY)
Admission: EM | Admit: 2016-12-10 | Discharge: 2016-12-11 | Disposition: A | Discharge status: Home / Self Care | Payer: Medicare Other | Attending: Emergency Medicine | Admitting: Emergency Medicine

## 2016-12-10 DIAGNOSIS — E1140 Type 2 diabetes mellitus with diabetic neuropathy, unspecified: Secondary | ICD-10-CM | POA: Diagnosis not present

## 2016-12-10 DIAGNOSIS — Z7982 Long term (current) use of aspirin: Secondary | ICD-10-CM | POA: Diagnosis not present

## 2016-12-10 DIAGNOSIS — I129 Hypertensive chronic kidney disease with stage 1 through stage 4 chronic kidney disease, or unspecified chronic kidney disease: Secondary | ICD-10-CM | POA: Diagnosis not present

## 2016-12-10 DIAGNOSIS — Z79899 Other long term (current) drug therapy: Secondary | ICD-10-CM | POA: Diagnosis not present

## 2016-12-10 DIAGNOSIS — Y939 Activity, unspecified: Secondary | ICD-10-CM | POA: Insufficient documentation

## 2016-12-10 DIAGNOSIS — Z87891 Personal history of nicotine dependence: Secondary | ICD-10-CM | POA: Diagnosis not present

## 2016-12-10 DIAGNOSIS — T63301A Toxic effect of unspecified spider venom, accidental (unintentional), initial encounter: Secondary | ICD-10-CM | POA: Diagnosis not present

## 2016-12-10 DIAGNOSIS — N183 Chronic kidney disease, stage 3 (moderate): Secondary | ICD-10-CM | POA: Diagnosis not present

## 2016-12-10 DIAGNOSIS — Y999 Unspecified external cause status: Secondary | ICD-10-CM | POA: Insufficient documentation

## 2016-12-10 DIAGNOSIS — Y929 Unspecified place or not applicable: Secondary | ICD-10-CM | POA: Insufficient documentation

## 2016-12-10 DIAGNOSIS — S80861A Insect bite (nonvenomous), right lower leg, initial encounter: Secondary | ICD-10-CM | POA: Diagnosis not present

## 2016-12-10 DIAGNOSIS — W57XXXA Bitten or stung by nonvenomous insect and other nonvenomous arthropods, initial encounter: Secondary | ICD-10-CM | POA: Diagnosis not present

## 2016-12-10 DIAGNOSIS — S40862A Insect bite (nonvenomous) of left upper arm, initial encounter: Secondary | ICD-10-CM | POA: Diagnosis not present

## 2016-12-10 DIAGNOSIS — S80862A Insect bite (nonvenomous), left lower leg, initial encounter: Secondary | ICD-10-CM | POA: Diagnosis not present

## 2016-12-10 LAB — URINALYSIS, ROUTINE W REFLEX MICROSCOPIC
Bacteria, UA: NONE SEEN
Bilirubin Urine: NEGATIVE
Glucose, UA: 50 mg/dL — AB
Hgb urine dipstick: NEGATIVE
Ketones, ur: NEGATIVE mg/dL
Nitrite: NEGATIVE
Protein, ur: NEGATIVE mg/dL
RBC / HPF: NONE SEEN RBC/hpf (ref 0–5)
Specific Gravity, Urine: 1.005 (ref 1.005–1.030)
pH: 7 (ref 5.0–8.0)

## 2016-12-10 LAB — CBC WITH DIFFERENTIAL/PLATELET
Basophils Absolute: 0 10*3/uL (ref 0.0–0.1)
Basophils Relative: 0 %
Eosinophils Absolute: 0.2 10*3/uL (ref 0.0–0.7)
Eosinophils Relative: 3 %
HCT: 34.3 % — ABNORMAL LOW (ref 36.0–46.0)
Hemoglobin: 10.9 g/dL — ABNORMAL LOW (ref 12.0–15.0)
Lymphocytes Relative: 23 %
Lymphs Abs: 2.1 10*3/uL (ref 0.7–4.0)
MCH: 27 pg (ref 26.0–34.0)
MCHC: 31.8 g/dL (ref 30.0–36.0)
MCV: 84.9 fL (ref 78.0–100.0)
Monocytes Absolute: 0.7 10*3/uL (ref 0.1–1.0)
Monocytes Relative: 8 %
Neutro Abs: 6 10*3/uL (ref 1.7–7.7)
Neutrophils Relative %: 66 %
Platelets: 324 10*3/uL (ref 150–400)
RBC: 4.04 MIL/uL (ref 3.87–5.11)
RDW: 17.6 % — ABNORMAL HIGH (ref 11.5–15.5)
WBC: 9 10*3/uL (ref 4.0–10.5)

## 2016-12-10 NOTE — ED Triage Notes (Signed)
Patient reports of possible insect bite to left upper arm. States she has felt fatigue since Thursday. Patient states " I want to know if the spider was poisonous.

## 2016-12-10 NOTE — ED Provider Notes (Signed)
[facility] DEPT Provider Note   CSN: [id number] Arrival date & time: 12/10/16  2127     History   Chief Complaint Chief Complaint  Patient presents with  . Insect Bite    HPI Cheryl Abbott is a 71 y.o. female.  HPI   Cheryl Abbott is a 71 y.o. female who presents to the Emergency Department complaining of generalized body aches, weakness and possible spider bite to her left upper arm.  States that she has been bitten by multiple insects and believes that she was bitten by a spider 4 days ago.  She also complains of "mosquito bites" to both legs that are itching.  Symptoms associated with nausea.  She denies fever, chills, vomiting, abdominal or chest pain.    Past Medical History:  Diagnosis Date  . Anemia   . Anxiety   . Anxiety disorder   . Arthritis   . 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 Chronic back pain   . Depression   . Fatty liver   . GERD (gastroesophageal reflux disease)   . Headache   . History of adenomatous polyp of colon    2008  . History of kidney stones   . Hypercalcemia   . Hyperlipidemia   . Hypertension   . Left ureteral stone   . Lipoma of arm 01/19/2015  . Lumbar disc disease with radiculopathy   . Nephrolithiasis   . Neuropathy, peripheral   . OSA treated with BiPAP    per study 2007  . Sciatica   . Sigmoid diverticulosis   . Type 2 diabetes mellitus [facility])     Patient Active Problem List   Diagnosis Date Noted  . Morbid obesity ([place]) 10/14/2016  . Vaginal dryness, menopausal 10/14/2016  . Neuropathic pain of both feet 04/11/2016  . TIA (transient ischemic attack) 02/26/2016  . Lactose intolerance in adult 02/01/2016  . Brain TIA 08/19/2015  . CKD (chronic kidney disease) stage 3, GFR 30-59 ml/min 07/12/2015  . Constipation 05/19/2015  . Lipoma of arm 01/19/2015  . Anemia 10/14/2014  . Lumbar back pain with radiculopathy affecting left lower extremity 05/11/2014  . Nocturnal hypoxia 02/04/2014  . Generalized  anxiety disorder 05/20/2013  . Anxiety and depression 08/13/2012  . Abnormal brain scan 02/07/2012  . Thoracic or lumbosacral neuritis or radiculitis, unspecified 12/22/2010  . Hypercalcemia 11/30/2010  . Colon adenomas 05/11/2009  . HEMORRHOIDS 03/15/2009  . GERD 03/15/2009  . FATTY LIVER DISEASE 03/15/2009  . RENAL CALCULUS 03/15/2009  . Sleep apnea 09/15/2008  . 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 Hyperlipidemia LDL goal <100 09/17/2007  . Hypertension goal BP (blood pressure) < 130/80 09/17/2007    Past Surgical History:  Procedure Laterality Date  . BIOPSY N/A 03/17/2013   Procedure: GASTRIC BIOPSIES;  Surgeon: Danie Binder, MD;  Location: [facility];  Service: Endoscopy;  Laterality: N/A;  . BIOPSY  06/10/2015   Procedure: BIOPSY;  Surgeon: Danie Binder, MD;  [facility];  Service: Endoscopy;;  gastric biopsy  . CARDIAC CATHETERIZATION  05-11-2003  dr Shelva Majestic ([facility])   Abnormal cardiolite/   Normal coronary arteries and normal LVF,  ef  63%  . CARDIOVASCULAR STRESS TEST  01-01-2014   normal lexiscan cardiolite/  no ischemia/ infarct/  normal LV function and wall motion ,  ef 81%  . COLONOSCOPY  last one 10/02/2011   MOD [place] TICS, IH, NEXT TCS APR 2018  . CYSTO/   RIGHT URETEROSOCOPY  LASER LITHOTRIPSY STONE EXTRACTION  10-13-2004  . CYSTO/  RIGHT RETROGRADE PYELOGRAM/  PLACMENT RIGHT URETERAL STENT  01-10-2010  . CYSTOSCOPY W/ URETERAL STENT PLACEMENT Right 08/19/2015   Procedure: CYSTOSCOPY WITH RETROGRADE PYELOGRAM/URETERAL STENT PLACEMENT;  Surgeon: Franchot Gallo, MD;  Location: [facility];  Service: Urology;  Laterality: Right;  . CYSTOSCOPY WITH RETROGRADE PYELOGRAM, URETEROSCOPY AND STENT PLACEMENT Left 10/21/2014   Procedure: CYSTOSCOPY WITH RETROGRADE PYELOGRAM, URETEROSCOPY AND STENT PLACEMENT;  Surgeon: Franchot Gallo, MD;  Location: [facility];  Service: Urology;  Laterality: Left;  . CYSTOSCOPY WITH  RETROGRADE PYELOGRAM, URETEROSCOPY AND STENT PLACEMENT Right 11/22/2015   Procedure: CYSTOSCOPY, RIGHT URETERAL STENT REMOVAL; RIGHT RETROGRADE PYELOGRAM, RIGHT URETEROSCOPY WITH BALLOON DILATION; RIGHT URETERAL STENT PLACEMENT;  Surgeon: Franchot Gallo, MD;  Location[facility];  Service: Urology;  Laterality: Right;  . CYSTOSCOPY/RETROGRADE/URETEROSCOPY/STONE EXTRACTION WITH BASKET Bilateral 08/02/2015   Procedure: CYSTOSCOPY; BILATERAL RETROGRADE PYELOGRAMS; BILATERAL URETEROSCOPY, STONE EXTRACTION WITH BASKET;  Surgeon: Franchot Gallo, MD;  [facility]: [facility];  Service: Urology;  Laterality: Bilateral;  . CYSTOSCOPY/RETROGRADE/URETEROSCOPY/STONE EXTRACTION WITH BASKET Right 06/28/2015   Procedure: CYSTOSCOPY/RETROGRADE/URETEROSCOPY/STONE EXTRACTION WITH BASKET, RIGHT URETERAL DOUBLE J STENT PLACEMENT;  Surgeon: Franchot Gallo, MD;  [facility];  Service: Urology;  Laterality: Right;  . ESOPHAGOGASTRODUODENOSCOPY (EGD) WITH PROPOFOL N/A 03/17/2013   Procedure: ESOPHAGOGASTRODUODENOSCOPY (EGD) WITH PROPOFOL;  Surgeon: Danie Binder, MD;  [facility];  Service: Endoscopy;  Laterality: N/A;  . ESOPHAGOGASTRODUODENOSCOPY (EGD) WITH PROPOFOL N/A 06/10/2015   Procedure: ESOPHAGOGASTRODUODENOSCOPY (EGD) WITH PROPOFOL;  Surgeon: Danie Binder, MD;  [facility];  Service: Endoscopy;  Laterality: N/A;  1245 - pt knows to arrive at 8:30  . FLEXIBLE SIGMOIDOSCOPY  09/11/2011   VVZ:SMOLMBEM Internal hemorrhoids  . HOLMIUM LASER APPLICATION Left 7/54/4920   Procedure: HOLMIUM LASER APPLICATION;  Surgeon: Franchot Gallo, MD;  Location: [facility];  Service: Urology;  Laterality: Left;  . HOLMIUM LASER APPLICATION Right 1/00/7121   Procedure: HOLMIUM LASER APPLICATION;  Surgeon: Franchot Gallo, MD;  Location: [facility];  Service: Urology;  Laterality: Right;  . MASS EXCISION Left 03/04/2015   Procedure: EXCISION OF SOFT TISSUE NEOPLASM LEFT ARM;  Surgeon: Aviva Signs, MD;   Location: [facility];  Service: General;  Laterality: Left;  . PERCUTANEOUS NEPHROSTOLITHOTOMY Bilateral 12/  2011     [facility]  . POLYPECTOMY N/A 03/17/2013   Procedure: GASTRIC POLYPECTOMY;  Surgeon: Danie Binder, MD;  Location: [facility];  Service: Endoscopy;  Laterality: N/A;  . REMOVAL RIGHT THIGH CYST  2006  . SAVORY DILATION N/A 03/17/2013   Procedure: SAVORY DILATION;  Surgeon: Danie Binder, MD;  Location: [facility];  Service: Endoscopy;  Laterality: N/A;  #12.8, 14, 15, 16 dilators used  . SAVORY DILATION N/A 06/10/2015   Procedure: SAVORY DILATION;  Surgeon: Danie Binder, MD;  [facility];  Service: Endoscopy;  Laterality: N/A;  . TRANSTHORACIC ECHOCARDIOGRAM  01-01-2014   mild LVH/  ef 97-58%/  grade I diastolic dysfunction/  trivial MR, TR, and PR  . VAGINAL HYSTERECTOMY  1970's    OB History    Gravida Para Term Preterm AB Living   2 2 2     2    SAB TAB Ectopic Multiple Live Births                   Home Medications    Prior to Admission medications   Medication Sig Start Date End Date Taking? Authorizing Provider  ALPRAZolam Duanne Moron) 0.5 MG  tablet Take 1 tablet (0.5 mg total) by mouth 2 (two) times daily. 10/11/16   [name], MD  amLODipine (NORVASC) 10 MG tablet TAKE 1 TABLET (10 MG TOTAL) BY MOUTH DAILY. 06/18/16   [name], MD  aspirin EC 325 MG tablet Take 1 tablet (325 mg total) by mouth daily. 02/27/16   Lavina Hamman, MD  betamethasone dipropionate (DIPROLENE) 0.05 % cream Apply sparingly twice daily to rash  For 7 to 10 days only Patient taking differently: Apply topically daily as needed. Apply sparingly twice daily to rash  For 7 to 10 days only 04/11/16   [name], MD  diphenhydramine-acetaminophen (TYLENOL PM) 25-500 MG TABS tablet Take 2 tablets by mouth at bedtime.    [provider]  docusate sodium (COLACE) 100 MG capsule Take 100 mg by mouth at bedtime.     [provider]  linagliptin (TRADJENTA) 5  MG TABS tablet Take 1 tablet (5 mg total) by mouth daily. 10/24/16   [name], MD  lovastatin (MEVACOR) 40 MG tablet TAKE 2 TABLETS (80 MG TOTAL) BY MOUTH AT BEDTIME. 07/19/16   [name], MD  pantoprazole (PROTONIX) 40 MG tablet TAKE 1 TABLET (40 MG TOTAL) BY MOUTH 2 (TWO) TIMES DAILY. 09/03/16   [name], MD  PARoxetine (PAXIL) 40 MG tablet TAKE 1 TABLET BY MOUTH EVERY MORNING 12/07/16   [name], MD    Family History Family History  Problem Relation Age of Onset  . Hypertension Mother   . Diabetes Mother   . Heart failure Mother   . Dementia Mother   . Emphysema Father   . Hypertension Father   . Diabetes Brother   . GER disease Brother   . Hypertension Sister   . Hypertension Sister   . Cancer Unknown        family history   . Diabetes Unknown        family history   . Heart defect Unknown        famiily history   . Arthritis Unknown        family history   . Anesthesia problems Neg Hx   . Hypotension Neg Hx   . Malignant hyperthermia Neg Hx   . Pseudochol deficiency Neg Hx   . Colon cancer Neg Hx     Social History Social History  Substance Use Topics  . Smoking status: Former Smoker    Packs/day: 1.00    Years: 20.00    Types: Cigarettes    Start date: 09/21/1974    Quit date: 09/21/1994  . Smokeless tobacco: Never Used     Comment: Quit x 20 years  . Alcohol use No     Allergies   Ace inhibitors; Keflex [cephalexin]; Nitrofurantoin; and Penicillins   Review of Systems Review of Systems  Constitutional: Negative for activity change, appetite change, chills and fever.  HENT: Negative for facial swelling, sore throat and trouble swallowing.   Respiratory: Negative for chest tightness, shortness of breath and wheezing.   Cardiovascular: Negative for chest pain.  Gastrointestinal: Positive for nausea. Negative for abdominal pain and vomiting.  Genitourinary: Negative for difficulty urinating and dysuria.    Musculoskeletal: Positive for myalgias. Negative for neck pain and neck stiffness.  Skin: Positive for rash. Negative for wound.       Mosquito bites to both legs, spider bite to left upper arm.   Neurological: Negative for dizziness, syncope, weakness (generalized weakness), numbness and headaches.  Hematological: Negative for adenopathy.  All other systems reviewed and are negative.    Physical Exam Updated Vital Signs BP 130/85 (BP Location: Right Arm)   Pulse (!) 103   Temp 98.7 F (37.1 C) (Oral)   Resp 17   Ht 5\' 6"  (1.676 m)   Wt 90.7 kg (200 lb)   SpO2 96%   BMI 32.28 kg/m   Physical Exam  Constitutional: She is oriented to person, place, and time. She appears well-developed and well-nourished. No distress.  HENT:  Head: Normocephalic and atraumatic.  Mouth/Throat: Oropharynx is clear and moist.  Neck: Normal range of motion. Neck supple.  Cardiovascular: Normal rate, regular rhythm, normal heart sounds and intact distal pulses.   No murmur heard. Pulmonary/Chest: Effort normal and breath sounds normal. No respiratory distress.  Musculoskeletal: Normal range of motion. She exhibits no edema or tenderness.  Lymphadenopathy:    She has no cervical adenopathy.  Neurological: She is alert and oriented to person, place, and time. No sensory deficit. She exhibits normal muscle tone. Coordination normal.  CN II-XII intact  Skin: Skin is warm. Capillary refill takes less than 2 seconds. Rash noted. There is erythema.  3 cm area of ecchymosis to the left upper arm.  No hematoma, induration or obvious bite mark.    Psychiatric: She has a normal mood and affect. Her behavior is normal. Thought content normal.  Nursing note and vitals reviewed.    ED Treatments / Results  Labs (all labs ordered are listed, but only abnormal results are displayed) Labs Reviewed  BASIC METABOLIC PANEL  CBC WITH DIFFERENTIAL/PLATELET    EKG  EKG Interpretation None        Radiology No results found.  Procedures Procedures (including critical care time)  Medications Ordered in ED Medications - No data to display   Initial Impression / Assessment and Plan / ED Course  I have reviewed the triage vital signs and the nursing notes.  Pertinent labs & imaging results that were available during my care of the patient were reviewed by me and considered in my medical decision making (see chart for details).     Pt also seen by Dr. Dayna Barker and care plan discussed.    Labs reviewed, urine cultured.  Creatinine elevated but improved from previous from May 2018.  Likely insect bites to LE's.  Area of concern to the upper arm appears c/w a bruise, does not appear c/w tick bite.    Final Clinical Impressions(s) / ED Diagnoses   Final diagnoses:  Insect bite, initial encounter    New Prescriptions New Prescriptions   No medications on file     Kem Parkinson, Hershal Coria 12/11/16 0038    Mesner, Corene Cornea, MD 12/11/16 [phone]

## 2016-12-10 NOTE — ED Provider Notes (Signed)
Medical screening examination/treatment/procedure(s) were conducted as a shared visit with non-physician practitioner(s) and myself.  I personally evaluated the patient during the encounter.  Has a bruise, thinks it might be a bite. Also has had some myalgias, decreased energy but no fevers.  Exam with 1.2 cm ecchymotic area to left upper arm without evidence of puncture/bite. Mild ttp there but no induration, edema, erythema or warmth.  Plan for screening workup and likely discharge if all normal.    Shavone Nevers, Corene Cornea, MD 12/11/16 [phone]

## 2016-12-10 NOTE — ED Notes (Signed)
Lab at bedside for blood work.

## 2016-12-10 NOTE — ED Notes (Signed)
ED Provider at bedside. 

## 2016-12-10 NOTE — ED Notes (Signed)
Pt ambulatory to restroom with assistance, urine sample obtained, sent to lab,

## 2016-12-11 LAB — BASIC METABOLIC PANEL
Anion gap: 8 (ref 5–15)
BUN: 16 mg/dL (ref 6–20)
CO2: 28 mmol/L (ref 22–32)
Calcium: 9.5 mg/dL (ref 8.9–10.3)
Chloride: 99 mmol/L — ABNORMAL LOW (ref 101–111)
Creatinine, Ser: 1.64 mg/dL — ABNORMAL HIGH (ref 0.44–1.00)
GFR calc Af Amer: 35 mL/min — ABNORMAL LOW (ref >60)
GFR calc non Af Amer: 30 mL/min — ABNORMAL LOW (ref >60)
Glucose, Bld: 164 mg/dL — ABNORMAL HIGH (ref 65–99)
Potassium: 3.7 mmol/L (ref 3.5–5.1)
Sodium: 135 mmol/L (ref 135–145)

## 2016-12-11 MED ORDER — TRIAMCINOLONE ACETONIDE 0.1 % EX CREA: 1.0000 "application " | TOPICAL_CREAM | Freq: Three times a day (TID) | CUTANEOUS | 0 refills | Status: DC

## 2016-12-11 NOTE — ED Notes (Signed)
Pt updated on plan of care,  

## 2016-12-11 NOTE — Discharge Instructions (Signed)
Use the cream as directed.  Follow-up with your primary doctor for recheck

## 2016-12-12 LAB — URINE CULTURE: Culture: <10000 — AB

## 2016-12-17 DIAGNOSIS — R0902 Hypoxemia: Secondary | ICD-10-CM | POA: Diagnosis not present

## 2016-12-21 DIAGNOSIS — N200 Calculus of kidney: Secondary | ICD-10-CM | POA: Diagnosis not present

## 2016-12-21 DIAGNOSIS — N183 Chronic kidney disease, stage 3 (moderate): Secondary | ICD-10-CM | POA: Diagnosis not present

## 2016-12-21 DIAGNOSIS — R809 Proteinuria, unspecified: Secondary | ICD-10-CM | POA: Diagnosis not present

## 2016-12-21 DIAGNOSIS — E559 Vitamin D deficiency, unspecified: Secondary | ICD-10-CM | POA: Diagnosis not present

## 2016-12-21 DIAGNOSIS — D509 Iron deficiency anemia, unspecified: Secondary | ICD-10-CM | POA: Diagnosis not present

## 2016-12-23 ENCOUNTER — Other Ambulatory Visit: Payer: Self-pay | Admitting: Family Medicine

## 2017-01-14 ENCOUNTER — Other Ambulatory Visit: Payer: Self-pay | Admitting: Family Medicine

## 2017-01-16 ENCOUNTER — Ambulatory Visit: Payer: Medicare Other | Admitting: Gastroenterology

## 2017-01-17 DIAGNOSIS — R0902 Hypoxemia: Secondary | ICD-10-CM | POA: Diagnosis not present

## 2017-01-21 ENCOUNTER — Ambulatory Visit (HOSPITAL_COMMUNITY): Payer: Medicare Other

## 2017-01-24 ENCOUNTER — Ambulatory Visit (HOSPITAL_COMMUNITY): Payer: Medicare Other

## 2017-01-29 ENCOUNTER — Telehealth: Payer: Self-pay | Admitting: Family Medicine

## 2017-01-29 NOTE — Telephone Encounter (Signed)
Patient came in today thinking that she had an appointment.  Her next appointment with Dr Moshe Cipro and Albina Billet  is Nov 17th @ 3pm & 4pm.  She states that she is not feeling well.  She is experiencing weakness and tiredness.  I asked how long and she states "A long time".  She is complaining of no energy. She verbalizes that her appetite is not that great and food comes back up after she eats.  Please advise

## 2017-01-30 ENCOUNTER — Ambulatory Visit (INDEPENDENT_AMBULATORY_CARE_PROVIDER_SITE_OTHER): Payer: Medicare Other | Admitting: Gastroenterology

## 2017-01-30 ENCOUNTER — Telehealth: Payer: Self-pay

## 2017-01-30 ENCOUNTER — Encounter: Payer: Self-pay | Admitting: Gastroenterology

## 2017-01-30 ENCOUNTER — Other Ambulatory Visit: Payer: Self-pay

## 2017-01-30 VITALS — BP 127/79 | HR 94 | Temp 96.5°F | Ht 68.0 in | Wt 202.2 lb

## 2017-01-30 DIAGNOSIS — D126 Benign neoplasm of colon, unspecified: Secondary | ICD-10-CM

## 2017-01-30 DIAGNOSIS — D509 Iron deficiency anemia, unspecified: Secondary | ICD-10-CM

## 2017-01-30 DIAGNOSIS — D508 Other iron deficiency anemias: Secondary | ICD-10-CM | POA: Diagnosis not present

## 2017-01-30 MED ORDER — PEG 3350-KCL-NA BICARB-NACL 420 G PO SOLR: 4000.0000 mL | ORAL | 0 refills | Status: DC

## 2017-01-30 NOTE — Telephone Encounter (Signed)
AB advised for pt to not take Tradjenta the morning of her TCS/+/-EGD. Noted on pt's instructions.

## 2017-01-30 NOTE — Progress Notes (Addendum)
REVIEWED-NO ADDITIONAL RECOMMENDATIONS.      Referring Provider: [name], MD Primary Care Physician:  [name], MD Primary GI: Dr. Oneida Alar   Chief Complaint  Patient presents with  . Colonoscopy    HPI:   Cheryl Abbott is a 71 y.o. female presenting today with a history of IDA, dysphagia, constipation. Last seen a year ago. Due for colonoscopy with history of adenomas. Last EGD in Dec 2016 with gastritis and distal esophageal stricture s/p dilation. Most recent CBC in July with Hbg 10.9, drifting down from mid 11 range. A year ago, ferritin 11, iron 14. No recent iron studies.   Belly feels swollen chronically. Feels like it is pushing up in her breast. Doesn't feel like doing anything but laying down. BM this morning, #2 Bristol scale. BM usually twice every morning, sometimes three times during the day. Denies feeling constipated. Denies abdominal pain, just feels "stuffed". Feels full and "heavy". Sometimes nauseated if getting up early in the morning. Little appetite. Up about 9 lbs since last seen. Sleepy during the day. No rectal bleeding. Taking Protonix AFTER eating, not before. BID. No dysphagia.   Past Medical History:  Diagnosis Date  . Anemia   . Anxiety   . Anxiety disorder   . Arthritis   . 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 Chronic back pain   . Depression   . Fatty liver   . GERD (gastroesophageal reflux disease)   . Headache   . History of adenomatous polyp of colon    2008  . History of kidney stones   . Hypercalcemia   . Hyperlipidemia   . Hypertension   . Left ureteral stone   . Lipoma of arm 01/19/2015  . Lumbar disc disease with radiculopathy   . Nephrolithiasis   . Neuropathy, peripheral   . OSA treated with BiPAP    per study 2007  . Sciatica   . Sigmoid diverticulosis   . Type 2 diabetes mellitus ([place])     Past Surgical History:  Procedure Laterality Date  . BIOPSY N/A 03/17/2013   Procedure: GASTRIC  BIOPSIES;  Surgeon: Danie Binder, MD;  Location: [facility];  Service: Endoscopy;  Laterality: N/A;  . BIOPSY  06/10/2015   Procedure: BIOPSY;  Surgeon: Danie Binder, MD;  [facility];  Service: Endoscopy;;  gastric biopsy  . CARDIAC CATHETERIZATION  05-11-2003  dr Shelva Majestic ([facility])   Abnormal cardiolite/   Normal coronary arteries and normal LVF,  ef  63%  . CARDIOVASCULAR STRESS TEST  01-01-2014   normal lexiscan cardiolite/  no ischemia/ infarct/  normal LV function and wall motion ,  ef 81%  . COLONOSCOPY  last one 10/02/2011   MOD [place] TICS, IH, NEXT TCS APR 2018  . CYSTO/   RIGHT URETEROSOCOPY LASER LITHOTRIPSY STONE EXTRACTION  10-13-2004  . CYSTO/  RIGHT RETROGRADE PYELOGRAM/  PLACMENT RIGHT URETERAL STENT  01-10-2010  . CYSTOSCOPY W/ URETERAL STENT PLACEMENT Right 08/19/2015   Procedure: CYSTOSCOPY WITH RETROGRADE PYELOGRAM/URETERAL STENT PLACEMENT;  Surgeon: Franchot Gallo, MD;  Location: [facility];  Service: Urology;  Laterality: Right;  . CYSTOSCOPY WITH RETROGRADE PYELOGRAM, URETEROSCOPY AND STENT PLACEMENT Left 10/21/2014   Procedure: CYSTOSCOPY WITH RETROGRADE PYELOGRAM, URETEROSCOPY AND STENT PLACEMENT;  Surgeon: Franchot Gallo, MD;  Location: [facility];  Service: Urology;  Laterality: Left;  . CYSTOSCOPY WITH RETROGRADE PYELOGRAM, URETEROSCOPY AND STENT PLACEMENT Right 11/22/2015   Procedure: CYSTOSCOPY, RIGHT URETERAL STENT REMOVAL; RIGHT  RETROGRADE PYELOGRAM, RIGHT URETEROSCOPY WITH BALLOON DILATION; RIGHT URETERAL STENT PLACEMENT;  Surgeon: Franchot Gallo, MD;  Location: [facility];  Service: Urology;  Laterality: Right;  . CYSTOSCOPY/RETROGRADE/URETEROSCOPY/STONE EXTRACTION WITH BASKET Bilateral 08/02/2015   Procedure: CYSTOSCOPY; BILATERAL RETROGRADE PYELOGRAMS; BILATERAL URETEROSCOPY, STONE EXTRACTION WITH BASKET;  Surgeon: Franchot Gallo, MD;  Location: [facility];  Service: Urology;  Laterality: Bilateral;  .  CYSTOSCOPY/RETROGRADE/URETEROSCOPY/STONE EXTRACTION WITH BASKET Right 06/28/2015   Procedure: CYSTOSCOPY/RETROGRADE/URETEROSCOPY/STONE EXTRACTION WITH BASKET, RIGHT URETERAL DOUBLE J STENT PLACEMENT;  Surgeon: Franchot Gallo, MD;  Location: [facility];  Service: Urology;  Laterality: Right;  . ESOPHAGOGASTRODUODENOSCOPY (EGD) WITH PROPOFOL N/A 03/17/2013   Procedure: ESOPHAGOGASTRODUODENOSCOPY (EGD) WITH PROPOFOL;  Surgeon: Danie Binder, MD;  Location: [facility];  Service: Endoscopy;  Laterality: N/A;  . ESOPHAGOGASTRODUODENOSCOPY (EGD) WITH PROPOFOL N/A 06/10/2015   Procedure: ESOPHAGOGASTRODUODENOSCOPY (EGD) WITH PROPOFOL;  Surgeon: Danie Binder, MD;  [facility];  Service: Endoscopy;  Laterality: N/A;  1245 - pt knows to arrive at 8:30  . FLEXIBLE SIGMOIDOSCOPY  09/11/2011   ELF:[id number] Internal hemorrhoids  . HOLMIUM LASER APPLICATION Left 2/58/5277   Procedure: HOLMIUM LASER APPLICATION;  Surgeon: Franchot Gallo, MD;  Location: [facility];  Service: Urology;  Laterality: Left;  . HOLMIUM LASER APPLICATION Right 02/02/2352   Procedure: HOLMIUM LASER APPLICATION;  Surgeon: Franchot Gallo, MD;  Location: [facility];  Service: Urology;  Laterality: Right;  . MASS EXCISION Left 03/04/2015   Procedure: EXCISION OF SOFT TISSUE NEOPLASM LEFT ARM;  Surgeon: Aviva Signs, MD;  Location: [facility];  Service: General;  Laterality: Left;  . PERCUTANEOUS NEPHROSTOLITHOTOMY Bilateral 12/  2011     [facility]  . POLYPECTOMY N/A 03/17/2013   Procedure: GASTRIC POLYPECTOMY;  Surgeon: Danie Binder, MD;  Location: [facility];  Service: Endoscopy;  Laterality: N/A;  . REMOVAL RIGHT THIGH CYST  2006  . SAVORY DILATION N/A 03/17/2013   Procedure: SAVORY DILATION;  Surgeon: Danie Binder, MD;  Location: [facility];  Service: Endoscopy;  Laterality: N/A;  #12.8, 14, 15, 16 dilators used  . SAVORY DILATION N/A 06/10/2015   Procedure: SAVORY DILATION;  Surgeon: Danie Binder, MD;  [facility];  Service: Endoscopy;  Laterality: N/A;  . TRANSTHORACIC ECHOCARDIOGRAM  01-01-2014   mild LVH/  ef 61-44%/  grade I diastolic dysfunction/  trivial MR, TR, and PR  . VAGINAL HYSTERECTOMY  1970's    Current Outpatient Prescriptions  Medication Sig Dispense Refill  . ALPRAZolam (XANAX) 0.5 MG tablet Take 1 tablet (0.5 mg total) by mouth 2 (two) times daily. 60 tablet 4  . amLODipine (NORVASC) 10 MG tablet TAKE 1 TABLET (10 MG TOTAL) BY MOUTH DAILY. 90 tablet 1  . Cholecalciferol (VITAMIN D3) 5000 units CAPS Take 1 capsule by mouth 2 (two) times daily.    . clopidogrel (PLAVIX) 75 MG tablet Take 75 mg by mouth daily.  1  . diphenhydramine-acetaminophen (TYLENOL PM) 25-500 MG TABS tablet Take 2 tablets by mouth at bedtime.    . docusate sodium (COLACE) 100 MG capsule Take 200 mg by mouth at bedtime.     . iron polysaccharides (NIFEREX) 150 MG capsule Take 150 mg by mouth daily.    Marland Kitchen linagliptin (TRADJENTA) 5 MG TABS tablet Take 1 tablet (5 mg total) by mouth daily. 30 tablet 5  . lovastatin (MEVACOR) 40 MG tablet TAKE 2 TABLETS (80 MG TOTAL) BY MOUTH AT BEDTIME. 180 tablet 1  . pantoprazole (PROTONIX) 40 MG tablet TAKE 1 TABLET (40  MG TOTAL) BY MOUTH 2 (TWO) TIMES DAILY. 180 tablet 1  . PARoxetine (PAXIL) 40 MG tablet TAKE 1 TABLET BY MOUTH EVERY MORNING 90 tablet 0  . triamcinolone cream (KENALOG) 0.1 % Apply 1 application topically 3 (three) times daily. 30 g 0  . betamethasone dipropionate (DIPROLENE) 0.05 % cream Apply sparingly twice daily to rash  For 7 to 10 days only (Patient not taking: Reported on 12/10/2016) 45 g 0   No current facility-administered medications for this visit.     Allergies as of 01/30/2017 - Review Complete 01/30/2017  Allergen Reaction Noted  . Ace inhibitors Cough 12/11/2010  . Keflex [cephalexin] Diarrhea and Nausea And Vomiting 07/24/2011  . Nitrofurantoin Diarrhea and Nausea And Vomiting 09/08/2009  . Penicillins Hives and Other (See Comments)  09/17/2007    Family History  Problem Relation Age of Onset  . Hypertension Mother   . Diabetes Mother   . Heart failure Mother   . Dementia Mother   . Emphysema Father   . Hypertension Father   . Diabetes Brother   . GER disease Brother   . Hypertension Sister   . Hypertension Sister   . Cancer Unknown        family history   . Diabetes Unknown        family history   . Heart defect Unknown        famiily history   . Arthritis Unknown        family history   . Anesthesia problems Neg Hx   . Hypotension Neg Hx   . Malignant hyperthermia Neg Hx   . Pseudochol deficiency Neg Hx   . Colon cancer Neg Hx     Social History   Social History  . Marital status: Divorced    Spouse name: N/A  . Number of children: 2  . Years of education: N/A   Occupational History  . retired     Social History Main Topics  . Smoking status: Former Smoker    Packs/day: 1.00    Years: 20.00    Types: Cigarettes    Start date: 09/21/1974    Quit date: 09/21/1994  . Smokeless tobacco: Never Used     Comment: Quit x 20 years  . Alcohol use No  . Drug use: No  . Sexual activity: No   Other Topics Concern  . None   Social History Narrative   Patient is right handed   Patient drinks some caffeine daily.    Review of Systems: As mentioned in HPI   Physical Exam: BP 127/79   Pulse 94   Temp (!) 96.5 F (35.8 C) (Oral)   Ht 5\' 8"  (1.727 m)   Wt 202 lb 3.2 oz (91.7 kg)   BMI 30.74 kg/m  General:   Alert and oriented. No distress noted. Pleasant and cooperative.  Head:  Normocephalic and atraumatic. Eyes:  Conjuctiva clear without scleral icterus. Mouth:  Oral mucosa pink and moist.  Abdomen:  +BS, soft, non-tender and non-distended. No rebound or guarding. No HSM or masses noted. Msk:  Symmetrical without gross deformities. Normal posture. Extremities:  Without edema. Neurologic:  Alert and  oriented x4 Psych:  Alert and cooperative. Normal mood and affect.  Lab Results    Component Value Date   IRON 14 (L) 08/19/2015   TIBC 379 08/19/2015   FERRITIN 11 08/19/2015   Lab Results  Component Value Date   WBC 9.0 12/10/2016   HGB 10.9 (L) 12/10/2016  HCT 34.3 (L) 12/10/2016   MCV 84.9 12/10/2016   PLT 324 12/10/2016

## 2017-01-30 NOTE — Telephone Encounter (Signed)
Please giove this patient an appointment to see me next week Monday

## 2017-01-30 NOTE — Patient Instructions (Signed)
PA info for TCS/+/-EGD submitted via [facility] website. No PA needed. Decision ID# [id number].

## 2017-01-30 NOTE — Patient Instructions (Addendum)
We have scheduled you for a colonoscopy and possible upper endoscopy with Dr. Oneida Alar.   Take the Protonix on an empty stomach, 30 minutes before breakfast and dinner.   Call me if you don't feel better after taking over the counter agents for constipation.

## 2017-01-31 ENCOUNTER — Telehealth: Payer: Self-pay

## 2017-01-31 NOTE — Telephone Encounter (Signed)
Tried to call pt,no answer, no answering machine. Pre-op appt is 02/22/17 at 10:00am. Letter mailed to pt.

## 2017-02-05 NOTE — Progress Notes (Signed)
CC'ED TO PCP 

## 2017-02-05 NOTE — Assessment & Plan Note (Signed)
71 year old female with IDA and due for colonoscopy now with history of adenomas. Last EGD in 2016 with gastritis and distal esophageal stricture s/p dilation. Drift in Hgb down to the 10 range from mid 11 range (baseline), without recent iron studies. No overt GI bleeding.   Proceed with colonoscopy due to history of adenomas, possible EGD at time of procedure. May need capsule endoscopy to wrap up evaluation. As of note, patient is on Plavix. Risks and benefits discussed in detail with stated understanding.  Propofol due to polypharmacy Take Protonix BEFORE eating BID, 30 minutes prior (currently takes after eating)

## 2017-02-06 ENCOUNTER — Ambulatory Visit: Payer: Medicare Other | Admitting: Family Medicine

## 2017-02-09 DIAGNOSIS — C9000 Multiple myeloma not having achieved remission: Secondary | ICD-10-CM

## 2017-02-09 HISTORY — DX: Multiple myeloma not having achieved remission: C90.00

## 2017-02-12 ENCOUNTER — Encounter: Payer: Self-pay | Admitting: Family Medicine

## 2017-02-12 ENCOUNTER — Other Ambulatory Visit: Payer: Self-pay | Admitting: Family Medicine

## 2017-02-12 ENCOUNTER — Ambulatory Visit (INDEPENDENT_AMBULATORY_CARE_PROVIDER_SITE_OTHER): Payer: Medicare Other | Admitting: Family Medicine

## 2017-02-12 VITALS — BP 110/70 | HR 105 | Temp 99.4°F | Ht 66.0 in | Wt 204.0 lb

## 2017-02-12 DIAGNOSIS — F329 Major depressive disorder, single episode, unspecified: Secondary | ICD-10-CM | POA: Diagnosis not present

## 2017-02-12 DIAGNOSIS — R197 Diarrhea, unspecified: Secondary | ICD-10-CM | POA: Diagnosis not present

## 2017-02-12 DIAGNOSIS — I10 Essential (primary) hypertension: Secondary | ICD-10-CM | POA: Diagnosis not present

## 2017-02-12 DIAGNOSIS — E785 Hyperlipidemia, unspecified: Secondary | ICD-10-CM

## 2017-02-12 DIAGNOSIS — F32A Depression, unspecified: Secondary | ICD-10-CM

## 2017-02-12 DIAGNOSIS — F419 Anxiety disorder, unspecified: Secondary | ICD-10-CM

## 2017-02-12 DIAGNOSIS — Z1231 Encounter for screening mammogram for malignant neoplasm of breast: Secondary | ICD-10-CM

## 2017-02-12 DIAGNOSIS — R350 Frequency of micturition: Secondary | ICD-10-CM

## 2017-02-12 DIAGNOSIS — E1121 Type 2 diabetes mellitus with diabetic nephropathy: Secondary | ICD-10-CM | POA: Diagnosis not present

## 2017-02-12 DIAGNOSIS — Z23 Encounter for immunization: Secondary | ICD-10-CM | POA: Diagnosis not present

## 2017-02-12 MED ORDER — CIPROFLOXACIN HCL 500 MG PO TABS: 500.0000 mg | ORAL_TABLET | Freq: Two times a day (BID) | ORAL | 0 refills | Status: DC

## 2017-02-12 NOTE — Assessment & Plan Note (Signed)
Controlled, no change in medication Cheryl Abbott is reminded of the importance of commitment to daily physical activity for 30 minutes or more, as able and the need to limit carbohydrate intake to 30 to 60 grams per meal to help with blood sugar control.   The need to take medication as prescribed, test blood sugar as directed, and to call between visits if there is a concern that blood sugar is uncontrolled is also discussed.   Cheryl Abbott is reminded of the importance of daily foot exam, annual eye examination, and good blood sugar, blood pressure and cholesterol control.  Diabetic Labs Latest Ref Rng & Units 12/10/2016 10/11/2016 09/17/2016 03/30/2016 03/07/2016  HbA1c <5.7 % - 7.5(H) - 6.4(H) -  Microalbumin <2.0 mg/dL - - - - -  Micro/Creat Ratio 0.0 - 30.0 mg/g - - - - -  Chol <200 mg/dL - 178 - 196 -  HDL >50 mg/dL - 47(L) - 54 -  Calc LDL <100 mg/dL - 99 - 118 -  Triglycerides <150 mg/dL - 162(H) - 118 -  Creatinine 0.44 - 1.00 mg/dL 1.64(H) 1.96(H) 1.41(H) 1.46(H) 1.46(H)   BP/Weight 02/12/2017 01/30/2017 12/11/2016 12/10/2016 10/11/2016 09/17/2016 08/06/7503  Systolic BP 183 358 251 - 898 421 031  Diastolic BP 70 79 94 - 80 84 78  Wt. (Lbs) 204 202.2 - 200 202 200 202  BMI 32.93 30.74 - 32.28 32.6 31.8 32.6   Foot/eye exam completion dates Latest Ref Rng & Units 02/24/2016 07/27/2015  Eye Exam No Retinopathy - No Retinopathy  Foot exam Order - - -  Foot Form Completion - Done -     ]

## 2017-02-12 NOTE — Progress Notes (Signed)
Cheryl Abbott     MRN: [id number]      DOB: Oct 01, 1945   HPI Cheryl Abbott is here for follow up and re-evaluation of chronic medical conditions, medication management and review of any available recent lab and radiology data.  Preventive health is updated, specifically  Cancer screening and Immunization.   Questions or concerns regarding consultations or procedures which the PT has had in the interim are  addressed. The PT denies any adverse reactions to current medications since the last visit.  3 week h/o diarrhea 2 week h/o frequency and dysuria , no flank pain , fever or chills   ROS Denies recent fever or chills. Denies sinus pressure, nasal congestion, ear pain or sore throat. Denies chest congestion, productive cough or wheezing. Denies chest pains, palpitations and leg swelling     Denies joint pain, swelling and limitation in mobility. Denies headaches, seizures, numbness, or tingling. Denies uncontrolled  depression, anxiety or insomnia. Denies skin break down or rash.   PE  BP 110/70 (BP Location: Left Arm, Patient Position: Sitting, Cuff Size: Large)   Pulse (!) 105   Temp 99.4 F (37.4 C)   Ht 5\' 6"  (1.676 m)   Wt 204 lb (92.5 kg)   SpO2 90%   BMI 32.93 kg/m   Patient alert and oriented and in no cardiopulmonary distress.  HEENT: No facial asymmetry, EOMI,   oropharynx pink and moist.  Neck supple no JVD, no mass.  Chest: Clear to auscultation bilaterally.  CVS: S1, S2 no murmurs, no S3.Regular rate.  ABD: Soft no renal angle tenderness, mild suprapubic tenderness   Ext: No edema  MS: Adequate though  Reduced  ROM spine, shoulders, hips and knees.  Skin: Intact, no ulcerations or rash noted.  Psych: Good eye contact, normal affect. Memory intact not anxious or depressed appearing.  CNS: CN 2-12 intact, power,  normal throughout.no focal deficits noted.   Assessment & Plan  Type 2 diabetes with nephropathy (HCC) Controlled, no change in  medication Cheryl Abbott is reminded of the importance of commitment to daily physical activity for 30 minutes or more, as able and the need to limit carbohydrate intake to 30 to 60 grams per meal to help with blood sugar control.   The need to take medication as prescribed, test blood sugar as directed, and to call between visits if there is a concern that blood sugar is uncontrolled is also discussed.   Cheryl Abbott is reminded of the importance of daily foot exam, annual eye examination, and good blood sugar, blood pressure and cholesterol control.  Diabetic Labs Latest Ref Rng & Units 12/10/2016 10/11/2016 09/17/2016 03/30/2016 03/07/2016  HbA1c <5.7 % - 7.5(H) - 6.4(H) -  Microalbumin <2.0 mg/dL - - - - -  Micro/Creat Ratio 0.0 - 30.0 mg/g - - - - -  Chol <200 mg/dL - 178 - 196 -  HDL >50 mg/dL - 47(L) - 54 -  Calc LDL <100 mg/dL - 99 - 118 -  Triglycerides <150 mg/dL - 162(H) - 118 -  Creatinine 0.44 - 1.00 mg/dL 1.64(H) 1.96(H) 1.41(H) 1.46(H) 1.46(H)   BP/Weight 02/12/2017 01/30/2017 12/11/2016 12/10/2016 10/11/2016 09/17/2016 06/13/7251  Systolic BP 664 403 474 - 259 563 875  Diastolic BP 70 79 94 - 80 84 78  Wt. (Lbs) 204 202.2 - 200 202 200 202  BMI 32.93 30.74 - 32.28 32.6 31.8 32.6   Foot/eye exam completion dates Latest Ref Rng & Units 02/24/2016 07/27/2015  Eye Exam  No Retinopathy - No Retinopathy  Foot exam Order - - -  Foot Form Completion - Done -     ]  Urinary frequency Symptomatic with h/o recurrent UTI Will treat empirically wih 3 day course of cipro  Hypertension goal BP (blood pressure) < 130/80 Controlled, no change in medication DASH diet and commitment to daily physical activity for a minimum of 30 minutes discussed and encouraged, as a part of hypertension management. The importance of attaining a healthy weight is also discussed.  BP/Weight 02/27/2017 02/26/2017 02/26/2017 02/19/2017 02/15/2017 02/12/2017 9/79/8921  Systolic BP 194 - 174 081 448 185 631  Diastolic BP 57 - 71  82 71 70 79  Wt. (Lbs) 222.66 - - - 206.4 204 202.2  BMI - 36.49 - - 33.82 32.93 30.74       Anxiety and depression Controlled, no change in medication   Morbid obesity (HCC) Deteriorated. Patient re-educated about  the importance of commitment to a  minimum of 150 minutes of exercise per week.  The importance of healthy food choices with portion control discussed. Encouraged to start a food diary, count calories and to consider  joining a support group. Sample diet sheets offered. Goals set by the patient for the next several months.   Weight /BMI 02/27/2017 02/26/2017 02/15/2017  WEIGHT 222 lb 10.6 oz - 206 lb 6.4 oz  HEIGHT - 5' 5.5" 5' 5.5"  BMI - 36.49 kg/m2 33.82 kg/m2      Diarrhea Three week h/o chronic diarrhea, has upcoming colonoscopy and is being evaluated by gI

## 2017-02-12 NOTE — Patient Instructions (Signed)
Reschedule September 17 appointments, BOTH, since prepping for colonoscopy on the 18 th  Fasting lipid , cmp and EGFR and hBA1C first week in October  MD f/u first week in October  Please schedule mammogram at checkout   You are referred for eye exam, Dr Kellie Moor office will callyou  Flu vaccine in office today  Three day course of ciprofloxacin is sent in for your urinary symptoms  Thank you  for choosing [facility]. We consider it a privelige to serve you.  Delivering excellent health care in a caring and  compassionate way is our goal.  Partnering with you,  so that together we can achieve this goal is our strategy.

## 2017-02-13 ENCOUNTER — Other Ambulatory Visit: Payer: Self-pay | Admitting: Family Medicine

## 2017-02-13 NOTE — Telephone Encounter (Signed)
Seen [phone]

## 2017-02-15 ENCOUNTER — Encounter (HOSPITAL_COMMUNITY): Payer: Medicare Other

## 2017-02-15 ENCOUNTER — Other Ambulatory Visit (HOSPITAL_COMMUNITY): Payer: Self-pay | Admitting: Oncology

## 2017-02-15 ENCOUNTER — Encounter (HOSPITAL_COMMUNITY): Payer: Medicare Other | Attending: Oncology | Admitting: Oncology

## 2017-02-15 ENCOUNTER — Encounter (HOSPITAL_COMMUNITY): Payer: Self-pay | Admitting: Oncology

## 2017-02-15 ENCOUNTER — Ambulatory Visit (HOSPITAL_COMMUNITY)
Admission: RE | Admit: 2017-02-15 | Discharge: 2017-02-15 | Disposition: A | Discharge status: Home / Self Care | Payer: Medicare Other | Source: Ambulatory Visit | Attending: Oncology | Admitting: Oncology

## 2017-02-15 DIAGNOSIS — Z8249 Family history of ischemic heart disease and other diseases of the circulatory system: Secondary | ICD-10-CM | POA: Diagnosis not present

## 2017-02-15 DIAGNOSIS — E1140 Type 2 diabetes mellitus with diabetic neuropathy, unspecified: Secondary | ICD-10-CM | POA: Diagnosis not present

## 2017-02-15 DIAGNOSIS — M25571 Pain in right ankle and joints of right foot: Secondary | ICD-10-CM | POA: Diagnosis not present

## 2017-02-15 DIAGNOSIS — E785 Hyperlipidemia, unspecified: Secondary | ICD-10-CM | POA: Insufficient documentation

## 2017-02-15 DIAGNOSIS — Z825 Family history of asthma and other chronic lower respiratory diseases: Secondary | ICD-10-CM | POA: Diagnosis not present

## 2017-02-15 DIAGNOSIS — F419 Anxiety disorder, unspecified: Secondary | ICD-10-CM | POA: Diagnosis not present

## 2017-02-15 DIAGNOSIS — G8929 Other chronic pain: Secondary | ICD-10-CM | POA: Diagnosis not present

## 2017-02-15 DIAGNOSIS — K219 Gastro-esophageal reflux disease without esophagitis: Secondary | ICD-10-CM | POA: Insufficient documentation

## 2017-02-15 DIAGNOSIS — N189 Chronic kidney disease, unspecified: Secondary | ICD-10-CM | POA: Insufficient documentation

## 2017-02-15 DIAGNOSIS — Z880 Allergy status to penicillin: Secondary | ICD-10-CM | POA: Diagnosis not present

## 2017-02-15 DIAGNOSIS — E1122 Type 2 diabetes mellitus with diabetic chronic kidney disease: Secondary | ICD-10-CM | POA: Insufficient documentation

## 2017-02-15 DIAGNOSIS — Z9889 Other specified postprocedural states: Secondary | ICD-10-CM | POA: Insufficient documentation

## 2017-02-15 DIAGNOSIS — M25572 Pain in left ankle and joints of left foot: Secondary | ICD-10-CM

## 2017-02-15 DIAGNOSIS — Z87891 Personal history of nicotine dependence: Secondary | ICD-10-CM | POA: Insufficient documentation

## 2017-02-15 DIAGNOSIS — Z833 Family history of diabetes mellitus: Secondary | ICD-10-CM | POA: Diagnosis not present

## 2017-02-15 DIAGNOSIS — D472 Monoclonal gammopathy: Secondary | ICD-10-CM

## 2017-02-15 DIAGNOSIS — Z8261 Family history of arthritis: Secondary | ICD-10-CM | POA: Diagnosis not present

## 2017-02-15 DIAGNOSIS — I129 Hypertensive chronic kidney disease with stage 1 through stage 4 chronic kidney disease, or unspecified chronic kidney disease: Secondary | ICD-10-CM | POA: Diagnosis not present

## 2017-02-15 DIAGNOSIS — Z809 Family history of malignant neoplasm, unspecified: Secondary | ICD-10-CM | POA: Insufficient documentation

## 2017-02-15 DIAGNOSIS — G4733 Obstructive sleep apnea (adult) (pediatric): Secondary | ICD-10-CM | POA: Insufficient documentation

## 2017-02-15 DIAGNOSIS — F329 Major depressive disorder, single episode, unspecified: Secondary | ICD-10-CM | POA: Insufficient documentation

## 2017-02-15 DIAGNOSIS — Z881 Allergy status to other antibiotic agents status: Secondary | ICD-10-CM | POA: Insufficient documentation

## 2017-02-15 DIAGNOSIS — Z79899 Other long term (current) drug therapy: Secondary | ICD-10-CM | POA: Diagnosis not present

## 2017-02-15 DIAGNOSIS — Z888 Allergy status to other drugs, medicaments and biological substances status: Secondary | ICD-10-CM | POA: Insufficient documentation

## 2017-02-15 LAB — IRON AND TIBC
Iron: 44 ug/dL (ref 28–170)
Saturation Ratios: 9 % — ABNORMAL LOW (ref 10.4–31.8)
TIBC: 479 ug/dL — ABNORMAL HIGH (ref 250–450)
UIBC: 435 ug/dL

## 2017-02-15 LAB — RETICULOCYTES
RBC.: 4.24 MIL/uL (ref 3.87–5.11)
Retic Count, Absolute: 59.4 10*3/uL (ref 19.0–186.0)
Retic Ct Pct: 1.4 % (ref 0.4–3.1)

## 2017-02-15 LAB — FERRITIN: Ferritin: 8 ng/mL — ABNORMAL LOW (ref 11–307)

## 2017-02-15 NOTE — Patient Instructions (Signed)
[facility] at [facility] Discharge Instructions  RECOMMENDATIONS MADE BY THE CONSULTANT AND ANY TEST RESULTS WILL BE SENT TO YOUR REFERRING PHYSICIAN.  You saw Dr. Zhou today.  Thank you for choosing [facility] at [facility] to provide your oncology and hematology care.  To afford each patient quality time with our provider, please arrive at least 15 minutes before your scheduled appointment time.    If you have a lab appointment with the Cancer Center please come in thru the  Main Entrance and check in at the main information desk  You need to re-schedule your appointment should you arrive 10 or more minutes late.  We strive to give you quality time with our providers, and arriving late affects you and other patients whose appointments are after yours.  Also, if you no show three or more times for appointments you may be dismissed from the clinic at the providers discretion.     Again, thank you for choosing [facility].  Our hope is that these requests will decrease the amount of time that you wait before being seen by our physicians.       _____________________________________________________________  Should you have questions after your visit to [facility], please contact our office at [phone] between the hours of 8:30 a.m. and 4:30 p.m.  Voicemails left after 4:30 p.m. will not be returned until the following business day.  For prescription refill requests, have your pharmacy contact our office.       Resources For Cancer Patients and their Caregivers ? American Cancer Society: Can assist with transportation, wigs, general needs, runs Look Good Feel Better.        1-888-227-6333 ? Cancer Care: Provides financial assistance, online support groups, medication/co-pay assistance.  1-800-813-HOPE (4673) ? [facility] Assists [place] cancer patients and their families through  emotional , educational and financial support.  336-427-4357 ? [place] DSS Where to apply for food stamps, Medicaid and utility assistance. 336-342-1394 ? [facility]: Transportation to medical appointments. 336-347-2287 ? Social Security Administration: May apply for disability if have a Stage IV cancer. 336-342-7796 1-800-772-1213 ? [place] [facility]ging, Disability and Transit Services: Assists with nutrition, care and transit needs. 336-349-2343  Cancer Center Support Programs: @10RELATIVEDAYS@ > Cancer Support Group  2nd Tuesday of the month 1pm-2pm, Journey Room  > Creative Journey  3rd Tuesday of the month 1130am-1pm, Journey Room  > Look Good Feel Better  1st Wednesday of the month 10am-12 noon, Journey Room (Call American Cancer Society to register 1-800-395-5775)    

## 2017-02-15 NOTE — Progress Notes (Signed)
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  HISTORY OF PRESENTING ILLNESS: Cheryl Abbott 71 y.o. female Presents for evaluation M spike in urine and serum. Patient been seen her nephrologist for continued follow-up of her CKD and had labwork performed on 12/07/16. 24 hour urine IFE which demonstrated Bence-Jones protein positive; kappa type. Immunofixation showed IgG monoclonal proteins with kappa light chain specificity. Urine M spike was 8.3 mg per 24 hours. Serum immunofixation also showed IgG monoclonal protein with light chain specificity. SPEP demonstrated M spike of 1.5 g/dL. Free kappa light chains 36.8, free lambda light chains 18.5, free kappa/lambda light chain ratio 1.99. Creatinine 1.6. Calcium 9.8. Patient states she feels fatigued. She has been having abdominal bloating for the past one week also. She denies any chest pain or shortness of breath. She denies any nausea or vomiting. She denies any bone pain, however she does have bilateral ankle pains.  Review of Systems - Oncology ROS as per HPI otherwise 12 point ROS is negative.   MEDICAL HISTORY: Past Medical History:  Diagnosis Date  . Anemia   . Anxiety   . Anxiety disorder   . Arthritis   . 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 Chronic back pain   . Depression   . Fatty liver   . GERD (gastroesophageal reflux disease)   . Headache   . History of adenomatous polyp of colon    2008  . History of kidney stones   . Hypercalcemia   . Hyperlipidemia   . Hypertension   . Left ureteral stone   . Lipoma  of arm 01/19/2015  . Lumbar disc disease with radiculopathy   . Nephrolithiasis   . Neuropathy, peripheral   . OSA treated with BiPAP    per study 2007  . Sciatica   . Sigmoid diverticulosis   . Type 2 diabetes mellitus ([place])     SURGICAL HISTORY: Past Surgical History:  Procedure Laterality Date  . BIOPSY N/A 03/17/2013   Procedure: GASTRIC BIOPSIES;  Surgeon: Danie Binder, MD;  Location: [facility];  Service: Endoscopy;  Laterality: N/A;  . BIOPSY  06/10/2015   Procedure: BIOPSY;  Surgeon: Danie Binder, MD;  [facility];  Service: Endoscopy;;  gastric biopsy  . CARDIAC CATHETERIZATION  05-11-2003  dr Shelva Majestic ([facility])   Abnormal cardiolite/   Normal coronary arteries and normal LVF,  ef  63%  . CARDIOVASCULAR STRESS TEST  01-01-2014   normal lexiscan cardiolite/  no ischemia/ infarct/  normal LV function and wall motion ,  ef 81%  . COLONOSCOPY  last one 10/02/2011   MOD [place] T[facility], IH, NEXT TCS APR 2018  . CYSTO/   RIGHT URETEROSOCOPY LASER LITHOTRIPSY STONE EXTRACTION  10-13-2004  . CYSTO/  RIGHT RETROGRADE PYELOGRAM/  PLACMENT RIGHT URETERAL STENT  01-10-2010  . CYSTOSCOPY W/ URETERAL STENT PLACEMENT Right 08/19/2015   Procedure: CYSTOSCOPY WITH RETROGRADE PYELOGRAM/URETERAL STENT PLACEMENT;  Surgeon: Franchot Gallo, MD;  Location: [facility];  Service: Urology;  Laterality: Right;  . CYSTOSCOPY WITH RETROGRADE PYELOGRAM, URETEROSCOPY AND STENT PLACEMENT Left 10/21/2014   Procedure: CYSTOSCOPY WITH RETROGRADE PYELOGRAM, URETEROSCOPY AND STENT PLACEMENT;  Surgeon:  Franchot Gallo, MD;  Location: [facility];  Service: Urology;  Laterality: Left;  . CYSTOSCOPY WITH RETROGRADE PYELOGRAM, URETEROSCOPY AND STENT PLACEMENT Right 11/22/2015   Procedure: CYSTOSCOPY, RIGHT URETERAL STENT REMOVAL; RIGHT RETROGRADE PYELOGRAM, RIGHT URETEROSCOPY WITH BALLOON DILATION; RIGHT URETERAL STENT PLACEMENT;  Surgeon: Franchot Gallo, MD;  Location: [facility];   Service: Urology;  Laterality: Right;  . CYSTOSCOPY/RETROGRADE/URETEROSCOPY/STONE EXTRACTION WITH BASKET Bilateral 08/02/2015   Procedure: CYSTOSCOPY; BILATERAL RETROGRADE PYELOGRAMS; BILATERAL URETEROSCOPY, STONE EXTRACTION WITH BASKET;  Surgeon: Franchot Gallo, MD;  Location: [facility];  Service: Urology;  Laterality: Bilateral;  . CYSTOSCOPY/RETROGRADE/URETEROSCOPY/STONE EXTRACTION WITH BASKET Right 06/28/2015   Procedure: CYSTOSCOPY/RETROGRADE/URETEROSCOPY/STONE EXTRACTION WITH BASKET, RIGHT URETERAL DOUBLE J STENT PLACEMENT;  Surgeon: Franchot Gallo, MD;  Location: [facility];  Service: Urology;  Laterality: Right;  . ESOPHAGOGASTRODUODENOSCOPY (EGD) WITH PROPOFOL N/A 03/17/2013   Procedure: ESOPHAGOGASTRODUODENOSCOPY (EGD) WITH PROPOFOL;  Surgeon: Danie Binder, MD;  Location: [facility];  Service: Endoscopy;  Laterality: N/A;  . ESOPHAGOGASTRODUODENOSCOPY (EGD) WITH PROPOFOL N/A 06/10/2015   Distal gastritis, distal esophageal stricture s/p dilation  . FLEXIBLE SIGMOIDOSCOPY  09/11/2011   [id number] Internal hemorrhoids  . HOLMIUM LASER APPLICATION Left 6/73/4193   Procedure: HOLMIUM LASER APPLICATION;  Surgeon: Franchot Gallo, MD;  Location: [facility];  Service: Urology;  Laterality: Left;  . HOLMIUM LASER APPLICATION Right 7/90/2409   Procedure: HOLMIUM LASER APPLICATION;  Surgeon: Franchot Gallo, MD;  Location: [facility];  Service: Urology;  Laterality: Right;  . MASS EXCISION Left 03/04/2015   Procedure: EXCISION OF SOFT TISSUE NEOPLASM LEFT ARM;  Surgeon: Aviva Signs, MD;  Location: [facility];  Service: General;  Laterality: Left;  . PERCUTANEOUS NEPHROSTOLITHOTOMY Bilateral 12/  2011     [facility]  . POLYPECTOMY N/A 03/17/2013   Procedure: GASTRIC POLYPECTOMY;  Surgeon: Danie Binder, MD;  Location: [facility];  Service: Endoscopy;  Laterality: N/A;  . REMOVAL RIGHT THIGH CYST  2006  . SAVORY DILATION N/A 03/17/2013   Procedure: SAVORY DILATION;  Surgeon: Danie Binder, MD;   Location: [facility];  Service: Endoscopy;  Laterality: N/A;  #12.8, 14, 15, 16 dilators used  . SAVORY DILATION N/A 06/10/2015   Procedure: SAVORY DILATION;  Surgeon: Danie Binder, MD;  [facility];  Service: Endoscopy;  Laterality: N/A;  . TRANSTHORACIC ECHOCARDIOGRAM  01-01-2014   mild LVH/  ef 73-53%/  grade I diastolic dysfunction/  trivial MR, TR, and PR  . VAGINAL HYSTERECTOMY  1970's    SOCIAL HISTORY: Social History   Social History  . Marital status: Divorced    Spouse name: N/A  . Number of children: 2  . Years of education: N/A   Occupational History  . retired     Social History Main Topics  . Smoking status: Former Smoker    Packs/day: 1.00    Years: 20.00    Types: Cigarettes    Start date: 09/21/1974    Quit date: 09/21/1994  . Smokeless tobacco: Never Used     Comment: Quit x 20 years  . Alcohol use No  . Drug use: No  . Sexual activity: No   Other Topics Concern  . Not on file   Social History Narrative   Patient is right handed   Patient drinks some caffeine daily.    FAMILY HISTORY Family History  Problem Relation Age of Onset  . Hypertension Mother   . Diabetes Mother   . Heart failure Mother   . Dementia Mother   . Emphysema  Father   . Hypertension Father   . Diabetes Brother   . GER disease Brother   . Hypertension Sister   . Hypertension Sister   . Cancer Unknown        family history   . Diabetes Unknown        family history   . Heart defect Unknown        famiily history   . Arthritis Unknown        family history   . Anesthesia problems Neg Hx   . Hypotension Neg Hx   . Malignant hyperthermia Neg Hx   . Pseudochol deficiency Neg Hx   . Colon cancer Neg Hx     ALLERGIES:  is allergic to ace inhibitors; keflex [cephalexin]; nitrofurantoin; and penicillins.  MEDICATIONS:  Current Outpatient Prescriptions  Medication Sig Dispense Refill  . ALPRAZolam (XANAX) 0.5 MG tablet Take 1 tablet (0.5 mg total) by mouth 2  (two) times daily. 60 tablet 4  . amLODipine (NORVASC) 10 MG tablet TAKE 1 TABLET (10 MG TOTAL) BY MOUTH DAILY. 90 tablet 1  . Cholecalciferol (VITAMIN D3) 5000 units CAPS Take 1 capsule by mouth 2 (two) times daily.    . ciprofloxacin (CIPRO) 500 MG tablet Take 1 tablet (500 mg total) by mouth 2 (two) times daily. 6 tablet 0  . clopidogrel (PLAVIX) 75 MG tablet Take 75 mg by mouth daily.  1  . diphenhydramine-acetaminophen (TYLENOL PM) 25-500 MG TABS tablet Take 2 tablets by mouth at bedtime.    . docusate sodium (COLACE) 100 MG capsule Take 200 mg by mouth at bedtime.     . iron polysaccharides (NIFEREX) 150 MG capsule Take 150 mg by mouth daily.    Marland Kitchen lovastatin (MEVACOR) 40 MG tablet TAKE 2 TABLETS (80 MG TOTAL) BY MOUTH AT BEDTIME. 180 tablet 1  . pantoprazole (PROTONIX) 40 MG tablet TAKE 1 TABLET (40 MG TOTAL) BY MOUTH 2 (TWO) TIMES DAILY. 180 tablet 1  . PARoxetine (PAXIL) 40 MG tablet TAKE 1 TABLET BY MOUTH EVERY MORNING 90 tablet 0  . polyethylene glycol-electrolytes (TRILYTE) 420 g solution Take 4,000 mLs by mouth as directed. 4000 mL 0  . TRADJENTA 5 MG TABS tablet TAKE 1 TABLET BY MOUTH EVERY DAY 90 tablet 1  . triamcinolone cream (KENALOG) 0.1 % Apply 1 application topically 3 (three) times daily. 30 g 0   No current facility-administered medications for this visit.     PHYSICAL EXAMINATION:  ECOG PERFORMANCE STATUS: 1 - Symptomatic but completely ambulatory   Vitals:   02/15/17 1306  BP: 124/71  Pulse: 95  Resp: 20  Temp: 98.2 F (36.8 C)  SpO2: 98%    Filed Weights   02/15/17 1306  Weight: 206 lb 6.4 oz (93.6 kg)     Physical Exam Constitutional: Well-developed, well-nourished, and in no distress.   HENT:  Head: Normocephalic and atraumatic.  Mouth/Throat: No oropharyngeal exudate. Mucosa moist. Eyes: Pupils are equal, round, and reactive to light. Conjunctivae are normal. No scleral icterus.  Neck: Normal range of motion. Neck supple. No JVD present.   Cardiovascular: Normal rate, regular rhythm and normal heart sounds.  Exam reveals no gallop and no friction rub.   No murmur heard. Pulmonary/Chest: Effort normal and breath sounds normal. No respiratory distress. No wheezes.No rales.  Abdominal: Soft. Bowel sounds are normal. No distension. There is no tenderness. There is no guarding.  Musculoskeletal: No edema or tenderness.  Lymphadenopathy:    No cervical or supraclavicular adenopathy.  Neurological: Alert and oriented to person, place, and time. No cranial nerve deficit.  Skin: Skin is warm and dry. No rash noted. No erythema. No pallor.  Psychiatric: Affect and judgment normal.   LABORATORY DATA: I have personally reviewed the data as listed:  Appointment on 02/15/2017  Component Date Value Ref Range Status  . Retic Ct Pct 02/15/2017 1.4  0.4 - 3.1 % Final  . RBC. 02/15/2017 4.24  3.87 - 5.11 MIL/uL Final  . Retic Count, Absolute 02/15/2017 59.4  19.0 - 186.0 K/uL Final    RADIOGRAPHIC STUDIES: I have personally reviewed the radiological images as listed and agree with the findings in the report  Dg Bone Survey Met  Result Date: 02/15/2017 CLINICAL DATA:  Monoclonal paraproteinemia. EXAM: METASTATIC BONE SURVEY COMPARISON:  CT scan of August 19, 2015. FINDINGS: There is no definite evidence of lytic lesion or other significant osseous abnormality involving the skull, spine, extremities, pelvis or ribcage. IMPRESSION: No definite lytic lesion seen in the visualized skeleton. Electronically Signed   By: Marijo Conception, M.D.   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  I will perform a full myeloma workup. I will repeat her myeloma labs. Check a skeletal survey to rule out lytic bone lesions. I will set her up to see IR for a CT guided bone marrow biopsy. RTC in 3-4 weeks to review the above workup and to discuss the next  plan of care.  Orders Placed This Encounter  Procedures  . DG Bone Survey Met    Standing Status:   Future    Number of Occurrences:   1    Standing Expiration Date:   04/17/2018    Order Specific Question:   Reason for Exam (SYMPTOM  OR DIAGNOSIS REQUIRED)    Answer:   monoclonal paraprotein r/o lytic bone lesions    Order Specific Question:   Preferred imaging location?    Answer:   [facility]    Order Specific Question:   Radiology Contrast Protocol - do NOT remove file path    Answer:   \\[email or web address].[email or web address]  . CT BIOPSY    Standing Status:   Future    Standing Expiration Date:   05/17/2018    Order Specific Question:   Lab orders requested (DO NOT place separate lab orders, these will be automatically ordered during procedure specimen collection):    Answer:   Surgical Pathology    Comments:   flow cytometry, cytogenetics, surg path    Order Specific Question:   Lab orders requested (DO NOT place separate lab orders, these will be automatically ordered during procedure specimen collection):    Answer:   Other    Order Specific Question:   Reason for Exam (SYMPTOM  OR DIAGNOSIS REQUIRED)    Answer:   monoclonal paraprotein in SPEP and UPEP    Order Specific Question:   Preferred imaging location?    Answer:   [facility]    Order Specific Question:   Radiology Contrast Protocol - do NOT remove file path    Answer:   \\[email or web address].pdf  . Beta 2 microglobuline, serum    Standing Status:   Future    Number of Occurrences:   1    Standing Expiration Date:   02/15/2018  . Ferritin    Standing Status:   Future    Number of Occurrences:   1  Standing Expiration Date:   02/15/2018  . Iron and TIBC    Standing Status:   Future    Number of Occurrences:   1    Standing Expiration Date:   02/15/2018  . Erythropoietin    Standing Status:   Future    Number of Occurrences:   1    Standing Expiration Date:    02/15/2018  . Reticulocytes    Standing Status:   Future    Number of Occurrences:   1    Standing Expiration Date:   02/15/2018  . IFE, 24hr Urine (w Total Protein)    Standing Status:   Standing    Number of Occurrences:   1  . UPEP/TP, 24-Hr Urine  . Multiple myeloma panel, serum    Standing Status:   Future    Number of Occurrences:   1    Standing Expiration Date:   02/15/2018  . Kappa/lambda light chains    Standing Status:   Future    Number of Occurrences:   1    Standing Expiration Date:   02/15/2018    All questions were answered. The patient knows to call the clinic with any problems, questions or concerns.  This note was electronically signed.    Twana First, MD  02/15/2017 4:14 PM

## 2017-02-16 LAB — BETA 2 MICROGLOBULIN, SERUM: Beta-2 Microglobulin: 3.6 mg/L — ABNORMAL HIGH (ref 0.6–2.4)

## 2017-02-16 LAB — ERYTHROPOIETIN: Erythropoietin: 27.2 m[IU]/mL — ABNORMAL HIGH (ref 2.6–18.5)

## 2017-02-17 ENCOUNTER — Other Ambulatory Visit: Payer: Self-pay | Admitting: Radiology

## 2017-02-17 DIAGNOSIS — Z87891 Personal history of nicotine dependence: Secondary | ICD-10-CM | POA: Diagnosis not present

## 2017-02-17 DIAGNOSIS — Z8249 Family history of ischemic heart disease and other diseases of the circulatory system: Secondary | ICD-10-CM | POA: Diagnosis not present

## 2017-02-17 DIAGNOSIS — Z825 Family history of asthma and other chronic lower respiratory diseases: Secondary | ICD-10-CM | POA: Diagnosis not present

## 2017-02-17 DIAGNOSIS — D472 Monoclonal gammopathy: Secondary | ICD-10-CM | POA: Diagnosis not present

## 2017-02-17 DIAGNOSIS — K219 Gastro-esophageal reflux disease without esophagitis: Secondary | ICD-10-CM | POA: Diagnosis not present

## 2017-02-17 DIAGNOSIS — R0902 Hypoxemia: Secondary | ICD-10-CM | POA: Diagnosis not present

## 2017-02-17 DIAGNOSIS — G4733 Obstructive sleep apnea (adult) (pediatric): Secondary | ICD-10-CM | POA: Diagnosis not present

## 2017-02-17 DIAGNOSIS — E785 Hyperlipidemia, unspecified: Secondary | ICD-10-CM | POA: Diagnosis not present

## 2017-02-17 DIAGNOSIS — Z833 Family history of diabetes mellitus: Secondary | ICD-10-CM | POA: Diagnosis not present

## 2017-02-17 DIAGNOSIS — E1140 Type 2 diabetes mellitus with diabetic neuropathy, unspecified: Secondary | ICD-10-CM | POA: Diagnosis not present

## 2017-02-17 DIAGNOSIS — Z8261 Family history of arthritis: Secondary | ICD-10-CM | POA: Diagnosis not present

## 2017-02-17 DIAGNOSIS — I129 Hypertensive chronic kidney disease with stage 1 through stage 4 chronic kidney disease, or unspecified chronic kidney disease: Secondary | ICD-10-CM | POA: Diagnosis not present

## 2017-02-17 DIAGNOSIS — M25572 Pain in left ankle and joints of left foot: Secondary | ICD-10-CM | POA: Diagnosis not present

## 2017-02-17 DIAGNOSIS — G8929 Other chronic pain: Secondary | ICD-10-CM | POA: Diagnosis not present

## 2017-02-17 DIAGNOSIS — Z881 Allergy status to other antibiotic agents status: Secondary | ICD-10-CM | POA: Diagnosis not present

## 2017-02-17 DIAGNOSIS — Z79899 Other long term (current) drug therapy: Secondary | ICD-10-CM | POA: Diagnosis not present

## 2017-02-17 DIAGNOSIS — N189 Chronic kidney disease, unspecified: Secondary | ICD-10-CM | POA: Diagnosis not present

## 2017-02-17 DIAGNOSIS — E1122 Type 2 diabetes mellitus with diabetic chronic kidney disease: Secondary | ICD-10-CM | POA: Diagnosis not present

## 2017-02-17 DIAGNOSIS — Z880 Allergy status to penicillin: Secondary | ICD-10-CM | POA: Diagnosis not present

## 2017-02-17 DIAGNOSIS — Z9889 Other specified postprocedural states: Secondary | ICD-10-CM | POA: Diagnosis not present

## 2017-02-17 DIAGNOSIS — Z809 Family history of malignant neoplasm, unspecified: Secondary | ICD-10-CM | POA: Diagnosis not present

## 2017-02-17 DIAGNOSIS — Z888 Allergy status to other drugs, medicaments and biological substances status: Secondary | ICD-10-CM | POA: Diagnosis not present

## 2017-02-18 ENCOUNTER — Other Ambulatory Visit: Payer: Self-pay | Admitting: Radiology

## 2017-02-18 ENCOUNTER — Telehealth: Payer: Self-pay

## 2017-02-18 DIAGNOSIS — E1121 Type 2 diabetes mellitus with diabetic nephropathy: Secondary | ICD-10-CM | POA: Diagnosis not present

## 2017-02-18 DIAGNOSIS — E785 Hyperlipidemia, unspecified: Secondary | ICD-10-CM | POA: Diagnosis not present

## 2017-02-18 DIAGNOSIS — I10 Essential (primary) hypertension: Secondary | ICD-10-CM | POA: Diagnosis not present

## 2017-02-18 LAB — KAPPA/LAMBDA LIGHT CHAINS
Kappa free light chain: 44.6 mg/L — ABNORMAL HIGH (ref 3.3–19.4)
Kappa, lambda light chain ratio: 2.48 — ABNORMAL HIGH (ref 0.26–1.65)
Lambda free light chains: 18 mg/L (ref 5.7–26.3)

## 2017-02-18 NOTE — Telephone Encounter (Signed)
Left vm to reschedule awv due to provider meeting - nr

## 2017-02-19 ENCOUNTER — Encounter (HOSPITAL_COMMUNITY): Payer: Self-pay

## 2017-02-19 ENCOUNTER — Ambulatory Visit (HOSPITAL_COMMUNITY)
Admission: RE | Admit: 2017-02-19 | Discharge: 2017-02-19 | Disposition: A | Discharge status: Home / Self Care | Payer: Medicare Other | Source: Ambulatory Visit | Attending: Oncology | Admitting: Oncology

## 2017-02-19 ENCOUNTER — Other Ambulatory Visit (HOSPITAL_COMMUNITY): Payer: Self-pay | Admitting: Oncology

## 2017-02-19 DIAGNOSIS — E1140 Type 2 diabetes mellitus with diabetic neuropathy, unspecified: Secondary | ICD-10-CM | POA: Insufficient documentation

## 2017-02-19 DIAGNOSIS — D649 Anemia, unspecified: Secondary | ICD-10-CM | POA: Insufficient documentation

## 2017-02-19 DIAGNOSIS — F329 Major depressive disorder, single episode, unspecified: Secondary | ICD-10-CM | POA: Diagnosis not present

## 2017-02-19 DIAGNOSIS — F419 Anxiety disorder, unspecified: Secondary | ICD-10-CM | POA: Insufficient documentation

## 2017-02-19 DIAGNOSIS — D472 Monoclonal gammopathy: Secondary | ICD-10-CM | POA: Diagnosis not present

## 2017-02-19 DIAGNOSIS — Z87891 Personal history of nicotine dependence: Secondary | ICD-10-CM | POA: Diagnosis not present

## 2017-02-19 DIAGNOSIS — D72822 Plasmacytosis: Secondary | ICD-10-CM | POA: Insufficient documentation

## 2017-02-19 DIAGNOSIS — Z79899 Other long term (current) drug therapy: Secondary | ICD-10-CM | POA: Diagnosis not present

## 2017-02-19 DIAGNOSIS — N189 Chronic kidney disease, unspecified: Secondary | ICD-10-CM | POA: Diagnosis not present

## 2017-02-19 DIAGNOSIS — G4733 Obstructive sleep apnea (adult) (pediatric): Secondary | ICD-10-CM | POA: Insufficient documentation

## 2017-02-19 DIAGNOSIS — Z880 Allergy status to penicillin: Secondary | ICD-10-CM | POA: Diagnosis not present

## 2017-02-19 DIAGNOSIS — E785 Hyperlipidemia, unspecified: Secondary | ICD-10-CM | POA: Insufficient documentation

## 2017-02-19 DIAGNOSIS — K219 Gastro-esophageal reflux disease without esophagitis: Secondary | ICD-10-CM | POA: Diagnosis not present

## 2017-02-19 DIAGNOSIS — I129 Hypertensive chronic kidney disease with stage 1 through stage 4 chronic kidney disease, or unspecified chronic kidney disease: Secondary | ICD-10-CM | POA: Diagnosis not present

## 2017-02-19 DIAGNOSIS — E1122 Type 2 diabetes mellitus with diabetic chronic kidney disease: Secondary | ICD-10-CM | POA: Diagnosis not present

## 2017-02-19 LAB — COMPLETE METABOLIC PANEL WITH GFR
AG Ratio: 0.9 (calc) — ABNORMAL LOW (ref 1.0–2.5)
ALT: 19 U/L (ref 6–29)
AST: 29 U/L (ref 10–35)
Albumin: 3.8 g/dL (ref 3.6–5.1)
Alkaline phosphatase (APISO): 106 U/L (ref 33–130)
BUN/Creatinine Ratio: 8 (calc) (ref 6–22)
BUN: 12 mg/dL (ref 7–25)
CO2: 27 mmol/L (ref 20–32)
Calcium: 9.8 mg/dL (ref 8.6–10.4)
Chloride: 102 mmol/L (ref 98–110)
Creat: 1.45 mg/dL — ABNORMAL HIGH (ref 0.60–0.93)
GFR, Est African American: 42 mL/min/{1.73_m2} — ABNORMAL LOW (ref >=60)
GFR, Est Non African American: 36 mL/min/{1.73_m2} — ABNORMAL LOW (ref >=60)
Globulin: 4.4 g/dL (calc) — ABNORMAL HIGH (ref 1.9–3.7)
Glucose, Bld: 128 mg/dL — ABNORMAL HIGH (ref 65–99)
Potassium: 3.8 mmol/L (ref 3.5–5.3)
Sodium: 138 mmol/L (ref 135–146)
Total Bilirubin: 0.3 mg/dL (ref 0.2–1.2)
Total Protein: 8.2 g/dL — ABNORMAL HIGH (ref 6.1–8.1)

## 2017-02-19 LAB — HEMOGLOBIN A1C
Hgb A1c MFr Bld: 7.5 % of total Hgb — ABNORMAL HIGH (ref <5.7)
Mean Plasma Glucose: 169 (calc)
eAG (mmol/L): 9.3 (calc)

## 2017-02-19 LAB — UIFE/LIGHT CHAINS/TP QN, 24-HR UR
% BETA, Urine: 26.9 %
ALPHA 1 URINE: 7 %
Albumin, U: 26.1 %
Alpha 2, Urine: 14.1 %
Free Kappa/Lambda Ratio: 27.24 — ABNORMAL HIGH (ref 2.04–10.37)
Free Lambda Lt Chains,Ur: 24.6 mg/L — ABNORMAL HIGH (ref 0.24–6.66)
Free Lt Chn Excr Rate: 670 mg/L — ABNORMAL HIGH (ref 1.35–24.19)
GAMMA GLOBULIN URINE: 25.8 %
M-SPIKE %, Urine: 5.1 % — ABNORMAL HIGH
M-Spike, Mg/24 Hr: 37 mg/24 hr — ABNORMAL HIGH
Total Protein, Urine-Ur/day: 734 mg/24 hr — ABNORMAL HIGH (ref 30–150)
Total Protein, Urine: 30.9 mg/dL
Total Volume: 2375

## 2017-02-19 LAB — MULTIPLE MYELOMA PANEL, SERUM
Albumin SerPl Elph-Mcnc: 3.7 g/dL (ref 2.9–4.4)
Albumin/Glob SerPl: 0.8 (ref 0.7–1.7)
Alpha 1: 0.2 g/dL (ref 0.0–0.4)
Alpha2 Glob SerPl Elph-Mcnc: 1 g/dL (ref 0.4–1.0)
B-Globulin SerPl Elph-Mcnc: 1.3 g/dL (ref 0.7–1.3)
Gamma Glob SerPl Elph-Mcnc: 2.1 g/dL — ABNORMAL HIGH (ref 0.4–1.8)
Globulin, Total: 4.7 g/dL — ABNORMAL HIGH (ref 2.2–3.9)
IgA: 236 mg/dL (ref 64–422)
IgG (Immunoglobin G), Serum: 2230 mg/dL — ABNORMAL HIGH (ref 700–1600)
IgM (Immunoglobulin M), Srm: 140 mg/dL (ref 26–217)
M Protein SerPl Elph-Mcnc: 1.5 g/dL — ABNORMAL HIGH
Total Protein ELP: 8.4 g/dL (ref 6.0–8.5)

## 2017-02-19 LAB — PROTIME-INR
INR: 0.96
Prothrombin Time: 12.7 seconds (ref 11.4–15.2)

## 2017-02-19 LAB — BASIC METABOLIC PANEL
Anion gap: 10 (ref 5–15)
BUN: 13 mg/dL (ref 6–20)
CO2: 23 mmol/L (ref 22–32)
Calcium: 9.5 mg/dL (ref 8.9–10.3)
Chloride: 103 mmol/L (ref 101–111)
Creatinine, Ser: 1.64 mg/dL — ABNORMAL HIGH (ref 0.44–1.00)
GFR calc Af Amer: 35 mL/min — ABNORMAL LOW (ref >60)
GFR calc non Af Amer: 30 mL/min — ABNORMAL LOW (ref >60)
Glucose, Bld: 148 mg/dL — ABNORMAL HIGH (ref 65–99)
Potassium: 3.6 mmol/L (ref 3.5–5.1)
Sodium: 136 mmol/L (ref 135–145)

## 2017-02-19 LAB — CBC WITH DIFFERENTIAL/PLATELET
Basophils Absolute: 0 10*3/uL (ref 0.0–0.1)
Basophils Relative: 0 %
Eosinophils Absolute: 0.1 10*3/uL (ref 0.0–0.7)
Eosinophils Relative: 2 %
HCT: 36.2 % (ref 36.0–46.0)
Hemoglobin: 11.7 g/dL — ABNORMAL LOW (ref 12.0–15.0)
Lymphocytes Relative: 22 %
Lymphs Abs: 1.7 10*3/uL (ref 0.7–4.0)
MCH: 27.6 pg (ref 26.0–34.0)
MCHC: 32.3 g/dL (ref 30.0–36.0)
MCV: 85.4 fL (ref 78.0–100.0)
Monocytes Absolute: 0.9 10*3/uL (ref 0.1–1.0)
Monocytes Relative: 11 %
Neutro Abs: 5.1 10*3/uL (ref 1.7–7.7)
Neutrophils Relative %: 65 %
Platelets: 337 10*3/uL (ref 150–400)
RBC: 4.24 MIL/uL (ref 3.87–5.11)
RDW: 18.4 % — ABNORMAL HIGH (ref 11.5–15.5)
WBC: 7.9 10*3/uL (ref 4.0–10.5)

## 2017-02-19 LAB — LIPID PANEL
Cholesterol: 155 mg/dL (ref <200)
HDL: 40 mg/dL — ABNORMAL LOW (ref >50)
LDL Cholesterol (Calc): 91 mg/dL (calc)
Non-HDL Cholesterol (Calc): 115 mg/dL (calc) (ref <130)
Total CHOL/HDL Ratio: 3.9 (calc) (ref <5.0)
Triglycerides: 141 mg/dL (ref <150)

## 2017-02-19 LAB — GLUCOSE, CAPILLARY: Glucose-Capillary: 152 mg/dL — ABNORMAL HIGH (ref 65–99)

## 2017-02-19 MED ORDER — MIDAZOLAM HCL 2 MG/2ML IJ SOLN
INTRAMUSCULAR | Status: AC | PRN
  Administered 2017-02-19 (×4): 1 mg via INTRAVENOUS

## 2017-02-19 MED ORDER — FENTANYL CITRATE (PF) 100 MCG/2ML IJ SOLN
INTRAMUSCULAR | Status: AC | PRN
  Administered 2017-02-19 (×4): 50 ug via INTRAVENOUS

## 2017-02-19 MED ORDER — MIDAZOLAM HCL 2 MG/2ML IJ SOLN
INTRAMUSCULAR | Status: AC
  Filled 2017-02-19: qty 4

## 2017-02-19 MED ORDER — SODIUM CHLORIDE 0.9 % IV SOLN
INTRAVENOUS | Status: DC
  Administered 2017-02-19: 09:00:00 via INTRAVENOUS

## 2017-02-19 MED ORDER — FENTANYL CITRATE (PF) 100 MCG/2ML IJ SOLN
INTRAMUSCULAR | Status: AC
  Filled 2017-02-19: qty 4

## 2017-02-19 NOTE — Sedation Documentation (Signed)
Pt c/o pain, medicine given. See MAR. Will continue to monitor.

## 2017-02-19 NOTE — Procedures (Signed)
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   Complications: None Immediate  EBL: None  SignedSandi Mariscal Pager: [phone] 02/19/2017, 12:02 PM

## 2017-02-19 NOTE — Consult Note (Signed)
Chief Complaint: Patient was seen in consultation today for CT-guided bone marrow biopsy  Referring Physician(s): Zhou,Louise  Supervising Physician: Sandi Mariscal  Patient Status: [facility] - Out-pt  History of Present Illness: Cheryl Abbott is a 71 y.o. 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 She presents today for CT-guided bone marrow biopsy for further evaluation/rule out multiple myeloma.  Past Medical History:  Diagnosis Date  . Anemia   . Anxiety   . Anxiety disorder   . Arthritis   . 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 Chronic back pain   . Depression   . Fatty liver   . GERD (gastroesophageal reflux disease)   . Headache   . History of adenomatous polyp of colon    2008  . History of kidney stones   . Hypercalcemia   . Hyperlipidemia   . Hypertension   . Left ureteral stone   . Lipoma of arm 01/19/2015  . Lumbar disc disease with radiculopathy   . Nephrolithiasis   . Neuropathy, peripheral   . OSA treated with BiPAP    per study 2007  . Sciatica   . Sigmoid diverticulosis   . Type 2 diabetes mellitus ([place])     Past Surgical History:  Procedure Laterality Date  . BIOPSY N/A 03/17/2013   Procedure: GASTRIC BIOPSIES;  Surgeon: Danie Binder, MD;  Location: [facility];  Service: Endoscopy;  Laterality: N/A;  . BIOPSY  06/10/2015   Procedure: BIOPSY;  Surgeon: Danie Binder, MD;  Location: [facility];  Service: Endoscopy;;  gastric biopsy  . CARDIAC CATHETERIZATION  05-11-2003  dr Shelva Majestic ([facility])   Abnormal cardiolite/   Normal coronary arteries and normal LVF,  ef  63%  . CARDIOVASCULAR STRESS TEST  01-01-2014   normal lexiscan cardiolite/  no ischemia/ infarct/  normal LV function and wall motion ,  ef 81%  . COLONOSCOPY  last one 10/02/2011   [facility] [place] [facility], IH, NEXT TCS APR 2018  . CYSTO/   RIGHT URETEROSOCOPY LASER LITHOTRIPSY STONE EXTRACTION   10-13-2004  . CYSTO/  RIGHT RETROGRADE PYELOGRAM/  PLACMENT RIGHT URETERAL STENT  01-10-2010  . CYSTOSCOPY W/ URETERAL STENT PLACEMENT Right 08/19/2015   Procedure: CYSTOSCOPY WITH RETROGRADE PYELOGRAM/URETERAL STENT PLACEMENT;  Surgeon: Franchot Gallo, MD;  Location: [facility];  Service: Urology;  Laterality: Right;  . CYSTOSCOPY WITH RETROGRADE PYELOGRAM, URETEROSCOPY AND STENT PLACEMENT Left 10/21/2014   Procedure: CYSTOSCOPY WITH RETROGRADE PYELOGRAM, URETEROSCOPY AND STENT PLACEMENT;  Surgeon: Franchot Gallo, MD;  Location: [facility];  Service: Urology;  Laterality: Left;  . CYSTOSCOPY WITH RETROGRADE PYELOGRAM, URETEROSCOPY AND STENT PLACEMENT Right 11/22/2015   Procedure: CYSTOSCOPY, RIGHT URETERAL STENT REMOVAL; RIGHT RETROGRADE PYELOGRAM, RIGHT URETEROSCOPY WITH BALLOON DILATION; RIGHT URETERAL STENT PLACEMENT;  Surgeon: Franchot Gallo, MD;  Location: [facility];  Service: Urology;  Laterality: Right;  . CYSTOSCOPY/RETROGRADE/URETEROSCOPY/STONE EXTRACTION WITH BASKET Bilateral 08/02/2015   Procedure: CYSTOSCOPY; BILATERAL RETROGRADE PYELOGRAMS; BILATERAL URETEROSCOPY, STONE EXTRACTION WITH BASKET;  Surgeon: Franchot Gallo, MD;  Location: [facility];  Service: Urology;  Laterality: Bilateral;  . CYSTOSCOPY/RETROGRADE/URETEROSCOPY/STONE EXTRACTION WITH BASKET Right 06/28/2015   Procedure: CYSTOSCOPY/RETROGRADE/URETEROSCOPY/STONE EXTRACTION WITH BASKET, RIGHT URETERAL DOUBLE J STENT PLACEMENT;  Surgeon: Franchot Gallo, MD;  Location: [facility];  Service: Urology;  Laterality: Right;  . ESOPHAGOGASTRODUODENOSCOPY (EGD) WITH PROPOFOL N/A 03/17/2013   Procedure: ESOPHAGOGASTRODUODENOSCOPY (EGD) WITH PROPOFOL;  Surgeon: Danie Binder, MD;  Location: [facility];  Service: Endoscopy;  Laterality: N/A;  . ESOPHAGOGASTRODUODENOSCOPY (EGD) WITH PROPOFOL N/A 06/10/2015   Distal gastritis, distal esophageal stricture s/p dilation  . FLEXIBLE SIGMOIDOSCOPY  09/11/2011   XAJ:OINOMVEH Internal  hemorrhoids  . HOLMIUM LASER APPLICATION Left 07/21/4707   Procedure: HOLMIUM LASER APPLICATION;  Surgeon: Franchot Gallo, MD;  Location: [facility];  Service: Urology;  Laterality: Left;  . HOLMIUM LASER APPLICATION Right 12/06/3660   Procedure: HOLMIUM LASER APPLICATION;  Surgeon: Franchot Gallo, MD;  Location: [facility];  Service: Urology;  Laterality: Right;  . MASS EXCISION Left 03/04/2015   Procedure: EXCISION OF SOFT TISSUE NEOPLASM LEFT ARM;  Surgeon: Aviva Signs, MD;  Location: [facility];  Service: General;  Laterality: Left;  . PERCUTANEOUS NEPHROSTOLITHOTOMY Bilateral 12/  2011     [facility]  . POLYPECTOMY N/A 03/17/2013   Procedure: GASTRIC POLYPECTOMY;  Surgeon: Danie Binder, MD;  Location: [facility];  Service: Endoscopy;  Laterality: N/A;  . REMOVAL RIGHT THIGH CYST  2006  . SAVORY DILATION N/A 03/17/2013   Procedure: SAVORY DILATION;  Surgeon: Danie Binder, MD;  Location: [facility];  Service: Endoscopy;  Laterality: N/A;  #12.8, 14, 15, 16 dilators used  . SAVORY DILATION N/A 06/10/2015   Procedure: SAVORY DILATION;  Surgeon: Danie Binder, MD;  [facility];  Service: Endoscopy;  Laterality: N/A;  . TRANSTHORACIC ECHOCARDIOGRAM  01-01-2014   mild LVH/  ef 94-76%/  grade I diastolic dysfunction/  trivial MR, TR, and PR  . VAGINAL HYSTERECTOMY  1970's    Allergies: Ace inhibitors; Keflex [cephalexin]; Nitrofurantoin; and Penicillins  Medications: Prior to Admission medications   Medication Sig Start Date End Date Taking? Authorizing Provider  ALPRAZolam Duanne Moron) 0.5 MG tablet Take 1 tablet (0.5 mg total) by mouth 2 (two) times daily. 10/11/16  Yes [name], MD  amLODipine (NORVASC) 10 MG tablet TAKE 1 TABLET (10 MG TOTAL) BY MOUTH DAILY. 12/24/16  Yes [name], MD  Cholecalciferol (VITAMIN D3) 5000 units CAPS Take 1 capsule by mouth 2 (two) times daily.   Yes [provider]  ciprofloxacin (CIPRO) 500 MG tablet Take 1 tablet  (500 mg total) by mouth 2 (two) times daily. 02/12/17  Yes [name], MD  diphenhydramine-acetaminophen (TYLENOL PM) 25-500 MG TABS tablet Take 2 tablets by mouth at bedtime.   Yes [provider]  docusate sodium (COLACE) 100 MG capsule Take 200 mg by mouth at bedtime.    Yes [provider]  iron polysaccharides (NIFEREX) 150 MG capsule Take 150 mg by mouth daily.   Yes [provider]  lovastatin (MEVACOR) 40 MG tablet TAKE 2 TABLETS (80 MG TOTAL) BY MOUTH AT BEDTIME. 01/14/17  Yes [name], MD  pantoprazole (PROTONIX) 40 MG tablet TAKE 1 TABLET (40 MG TOTAL) BY MOUTH 2 (TWO) TIMES DAILY. 09/03/16  Yes [name], MD  PARoxetine (PAXIL) 40 MG tablet TAKE 1 TABLET BY MOUTH EVERY MORNING 12/07/16  Yes [name], MD  polyethylene glycol-electrolytes (TRILYTE) 420 g solution Take 4,000 mLs by mouth as directed. 01/30/17  Yes Fields, Marga Melnick, MD  TRADJENTA 5 MG TABS tablet TAKE 1 TABLET BY MOUTH EVERY DAY 02/13/17  Yes [name], MD  triamcinolone cream (KENALOG) 0.1 % Apply 1 application topically 3 (three) times daily. 12/11/16  Yes Triplett, Tammy, PA-C  clopidogrel (PLAVIX) 75 MG tablet Take 75 mg by mouth daily. 10/19/16   [provider]     Family History  Problem Relation Age  of Onset  . Hypertension Mother   . Diabetes Mother   . Heart failure Mother   . Dementia Mother   . Emphysema Father   . Hypertension Father   . Diabetes Brother   . GER disease Brother   . Hypertension Sister   . Hypertension Sister   . Cancer Unknown        family history   . Diabetes Unknown        family history   . Heart defect Unknown        famiily history   . Arthritis Unknown        family history   . Anesthesia problems Neg Hx   . Hypotension Neg Hx   . Malignant hyperthermia Neg Hx   . Pseudochol deficiency Neg Hx   . Colon cancer Neg Hx     Social History   Social History  . Marital status: Divorced    Spouse  name: N/A  . Number of children: 2  . Years of education: N/A   Occupational History  . retired     Social History Main Topics  . Smoking status: Former Smoker    Packs/day: 1.00    Years: 20.00    Types: Cigarettes    Start date: 09/21/1974    Quit date: 09/21/1994  . Smokeless tobacco: Never Used     Comment: Quit x 20 years  . Alcohol use No  . Drug use: No  . Sexual activity: No   Other Topics Concern  . None   Social History Narrative   Patient is right handed   Patient drinks some caffeine daily.      Review of Systems denies fever, headache, cough, vomiting or abnormal bleeding. She does have occasional chest discomfort, occasional dyspnea with exertion, abdominal and back discomfort as well as occasional nausea.  Vital Signs: BP 134/88   Pulse 96   Temp 99 F (37.2 C) (Oral)   Resp 18   SpO2 95%   Physical Exam awake, alert. Chest clear to auscultation bilaterally. Heart with regular rate and rhythm. Abdomen obese, soft, positive bowel sounds,some mild pelvic tenderness to palpation. Extremities with full range of motion.  Mallampati Score:     Imaging: Dg Bone Survey Met  Result Date: 02/15/2017 CLINICAL DATA:  Monoclonal paraproteinemia. EXAM: METASTATIC BONE SURVEY COMPARISON:  CT scan of August 19, 2015. FINDINGS: There is no definite evidence of lytic lesion or other significant osseous abnormality involving the skull, spine, extremities, pelvis or ribcage. IMPRESSION: No definite lytic lesion seen in the visualized skeleton. Electronically Signed   By: Marijo Conception, M.D.   On: 02/15/2017 15:23    Labs:  CBC:  Recent Labs  03/07/16 1719 09/17/16 1954 12/10/16 2324 02/19/17 0908  WBC 6.9 7.3 9.0 7.9  HGB 11.8* 11.6* 10.9* 11.7*  HCT 36.3 36.4 34.3* 36.2  PLT 324 317 324 337    COAGS:  Recent Labs  02/25/16 2254 02/19/17 0908  INR 0.97 0.96  APTT 31  --     BMP:  Recent Labs  10/11/16 1615 12/10/16 2324 02/18/17 1316  02/19/17 0908  NA 139 135 138 136  K 4.7 3.7 3.8 3.6  CL 100 99* 102 103  CO2 28 28 27 23   GLUCOSE 83 164* 128* 148*  BUN 13 16 12 13   CALCIUM 10.5* 9.5 9.8 9.5  CREATININE 1.96* 1.64* 1.45* 1.64*  GFRNONAA 25* 30* 36* 30*  GFRAA 29* 35* 42* 35*  LIVER FUNCTION TESTS:  Recent Labs  03/07/16 1719 03/30/16 1018 09/17/16 1954 10/11/16 1615 02/18/17 1316  BILITOT 0.3 0.3 0.1* 0.2 0.3  AST 15 13 21 25 29   ALT 14 11 16 21 19   ALKPHOS 82 78 113 122  --   PROT 8.6* 8.0 8.1 9.1* 8.2*  ALBUMIN 3.9 4.0 3.7 4.4  --     TUMOR MARKERS: No results for input(s): AFPTM, CEA, CA199, CHROMGRNA in the last 8760 hours.  Assessment and Plan: 71 y.o. 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 She presents today for CT-guided bone marrow biopsy for further evaluation/rule out multiple myeloma.Risks and benefits discussed with the patient/daughter including, but not limited to bleeding, infection, damage to adjacent structures or low yield requiring additional tests.All of the patient's questions were answered, patient is agreeable to proceed.Consent signed and in chart.     Thank you for this interesting consult.  I greatly enjoyed meeting Cheryl Abbott and look forward to participating in their care.  A copy of this report was sent to the requesting provider on this date.  Electronically Signed: D. Rowe Robert, PA-C 02/19/2017, 10:52 AM   I spent a total of 20 minutes  in face to face in clinical consultation, greater than 50% of which was counseling/coordinating care for CT-guided bone marrow biopsy

## 2017-02-19 NOTE — Sedation Documentation (Signed)
Patient denies pain and is resting comfortably.  

## 2017-02-19 NOTE — Discharge Instructions (Signed)
Bone Marrow Aspiration and Bone Marrow Biopsy, Adult, Care After °This sheet gives you information about how to care for yourself after your procedure. Your health care provider may also give you more specific instructions. If you have problems or questions, contact your health care provider. °What can I expect after the procedure? °After the procedure, it is common to have: °· Mild pain and tenderness. °· Swelling. °· Bruising. ° °Follow these instructions at home: °· Take over-the-counter or prescription medicines only as told by your health care provider. °· Do not take baths, swim, or use a hot tub until your health care provider approves. Ask if you can take a shower or have a sponge bath. °· Follow instructions from your health care provider about how to take care of the puncture site. Make sure you: °? Wash your hands with soap and water before you change your bandage (dressing). If soap and water are not available, use hand sanitizer. °? Change your dressing as told by your health care provider. °· Check your puncture site every day for signs of infection. Check for: °? More redness, swelling, or pain. °? More fluid or blood. °? Warmth. °? Pus or a bad smell. °· Return to your normal activities as told by your health care provider. Ask your health care provider what activities are safe for you. °· Do not drive for 24 hours if you were given a medicine to help you relax (sedative). °· Keep all follow-up visits as told by your health care provider. This is important. °Contact a health care provider if: °· You have more redness, swelling, or pain around the puncture site. °· You have more fluid or blood coming from the puncture site. °· Your puncture site feels warm to the touch. °· You have pus or a bad smell coming from the puncture site. °· You have a fever. °· Your pain is not controlled with medicine. °This information is not intended to replace advice given to you by your health care provider. Make sure  you discuss any questions you have with your health care provider. °Document Released: 12/15/2004 Document Revised: 12/16/2015 Document Reviewed: 11/09/2015 °Elsevier Interactive Patient Education © 2018 Elsevier Inc[employer] °Moderate Conscious Sedation, Adult, Care After °These instructions provide you with information about caring for yourself after your procedure. Your health care provider may also give you more specific instructions. Your treatment has been planned according to current medical practices, but problems sometimes occur. Call your health care provider if you have any problems or questions after your procedure. °What can I expect after the procedure? °After your procedure, it is common: °· To feel sleepy for several hours. °· To feel clumsy and have poor balance for several hours. °· To have poor judgment for several hours. °· To vomit if you eat too soon. ° °Follow these instructions at home: °For at least 24 hours after the procedure: ° °· Do not: °? Participate in activities where you could fall or become injured. °? Drive. °? Use heavy machinery. °? Drink alcohol. °? Take sleeping pills or medicines that cause drowsiness. °? Make important decisions or sign legal documents. °? Take care of children on your own. °· Rest. °Eating and drinking °· Follow the diet recommended by your health care provider. °· If you vomit: °? Drink water, juice, or soup when you can drink without vomiting. °? Make sure you have little or no nausea before eating solid foods. °General instructions °· Have a responsible adult stay with you until you are   awake and alert.  Take over-the-counter and prescription medicines only as told by your health care provider.  If you smoke, do not smoke without supervision.  Keep all follow-up visits as told by your health care provider. This is important. Contact a health care provider if:  You keep feeling nauseous or you keep vomiting.  You feel light-headed.  You develop a  rash.  You have a fever. Get help right away if:  You have trouble breathing. This information is not intended to replace advice given to you by your health care provider. Make sure you discuss any questions you have with your health care provider. Document Released: 03/18/2013 Document Revised: 10/31/2015 Document Reviewed: 09/17/2015 Elsevier Interactive Patient Education  [employer].

## 2017-02-20 ENCOUNTER — Ambulatory Visit: Payer: Medicare Other

## 2017-02-20 ENCOUNTER — Encounter: Payer: Self-pay | Admitting: Family Medicine

## 2017-02-20 DIAGNOSIS — E559 Vitamin D deficiency, unspecified: Secondary | ICD-10-CM | POA: Diagnosis not present

## 2017-02-20 DIAGNOSIS — I10 Essential (primary) hypertension: Secondary | ICD-10-CM | POA: Diagnosis not present

## 2017-02-20 DIAGNOSIS — Z79899 Other long term (current) drug therapy: Secondary | ICD-10-CM | POA: Diagnosis not present

## 2017-02-20 DIAGNOSIS — N183 Chronic kidney disease, stage 3 (moderate): Secondary | ICD-10-CM | POA: Diagnosis not present

## 2017-02-20 DIAGNOSIS — D509 Iron deficiency anemia, unspecified: Secondary | ICD-10-CM | POA: Diagnosis not present

## 2017-02-20 DIAGNOSIS — R809 Proteinuria, unspecified: Secondary | ICD-10-CM | POA: Diagnosis not present

## 2017-02-21 ENCOUNTER — Encounter (HOSPITAL_COMMUNITY)
Admission: RE | Admit: 2017-02-21 | Discharge: 2017-02-21 | Disposition: A | Discharge status: Home / Self Care | Payer: Medicare Other | Source: Ambulatory Visit | Attending: Gastroenterology | Admitting: Gastroenterology

## 2017-02-22 ENCOUNTER — Inpatient Hospital Stay (HOSPITAL_COMMUNITY): Admission: RE | Admit: 2017-02-22 | Payer: Medicare Other | Source: Ambulatory Visit

## 2017-02-25 ENCOUNTER — Ambulatory Visit: Payer: Medicare Other | Admitting: Family Medicine

## 2017-02-25 ENCOUNTER — Ambulatory Visit: Payer: Medicare Other

## 2017-02-25 ENCOUNTER — Telehealth: Payer: Self-pay

## 2017-02-25 NOTE — Telephone Encounter (Signed)
Tried to call pt to see if she would like to have TCS/EGD done earlier tomorrow morning d/t a cancellation, no answer, LMOVM for her to call office.

## 2017-02-26 ENCOUNTER — Observation Stay (HOSPITAL_COMMUNITY)
Admission: EM | Admit: 2017-02-26 | Discharge: 2017-02-28 | Disposition: A | Discharge status: Home / Self Care | Payer: Medicare Other | Attending: Family Medicine | Admitting: Family Medicine

## 2017-02-26 ENCOUNTER — Encounter (HOSPITAL_COMMUNITY): Payer: Self-pay | Admitting: Emergency Medicine

## 2017-02-26 ENCOUNTER — Ambulatory Visit (HOSPITAL_COMMUNITY): Payer: Medicare Other | Admitting: Anesthesiology

## 2017-02-26 ENCOUNTER — Encounter (HOSPITAL_COMMUNITY)
Admission: RE | Disposition: A | Discharge status: Home / Self Care | Payer: Self-pay | Source: Ambulatory Visit | Attending: Gastroenterology

## 2017-02-26 ENCOUNTER — Encounter (HOSPITAL_COMMUNITY): Payer: Self-pay | Admitting: Anesthesiology

## 2017-02-26 ENCOUNTER — Ambulatory Visit (HOSPITAL_BASED_OUTPATIENT_CLINIC_OR_DEPARTMENT_OTHER)
Admission: RE | Admit: 2017-02-26 | Discharge: 2017-02-26 | Disposition: A | Discharge status: Home / Self Care | Payer: Medicare Other | Source: Ambulatory Visit | Attending: Gastroenterology | Admitting: Gastroenterology

## 2017-02-26 ENCOUNTER — Emergency Department (HOSPITAL_COMMUNITY): Payer: Medicare Other

## 2017-02-26 ENCOUNTER — Other Ambulatory Visit: Payer: Self-pay

## 2017-02-26 DIAGNOSIS — K635 Polyp of colon: Secondary | ICD-10-CM | POA: Diagnosis not present

## 2017-02-26 DIAGNOSIS — R6510 Systemic inflammatory response syndrome (SIRS) of non-infectious origin without acute organ dysfunction: Secondary | ICD-10-CM | POA: Diagnosis not present

## 2017-02-26 DIAGNOSIS — K219 Gastro-esophageal reflux disease without esophagitis: Secondary | ICD-10-CM | POA: Diagnosis not present

## 2017-02-26 DIAGNOSIS — F411 Generalized anxiety disorder: Secondary | ICD-10-CM | POA: Insufficient documentation

## 2017-02-26 DIAGNOSIS — Z7982 Long term (current) use of aspirin: Secondary | ICD-10-CM | POA: Insufficient documentation

## 2017-02-26 DIAGNOSIS — G4733 Obstructive sleep apnea (adult) (pediatric): Secondary | ICD-10-CM | POA: Insufficient documentation

## 2017-02-26 DIAGNOSIS — K648 Other hemorrhoids: Secondary | ICD-10-CM | POA: Diagnosis not present

## 2017-02-26 DIAGNOSIS — E1140 Type 2 diabetes mellitus with diabetic neuropathy, unspecified: Secondary | ICD-10-CM | POA: Insufficient documentation

## 2017-02-26 DIAGNOSIS — E1121 Type 2 diabetes mellitus with diabetic nephropathy: Secondary | ICD-10-CM | POA: Diagnosis present

## 2017-02-26 DIAGNOSIS — K644 Residual hemorrhoidal skin tags: Secondary | ICD-10-CM | POA: Insufficient documentation

## 2017-02-26 DIAGNOSIS — K2970 Gastritis, unspecified, without bleeding: Secondary | ICD-10-CM

## 2017-02-26 DIAGNOSIS — Z79899 Other long term (current) drug therapy: Secondary | ICD-10-CM | POA: Insufficient documentation

## 2017-02-26 DIAGNOSIS — N183 Chronic kidney disease, stage 3 unspecified: Secondary | ICD-10-CM | POA: Diagnosis present

## 2017-02-26 DIAGNOSIS — D500 Iron deficiency anemia secondary to blood loss (chronic): Secondary | ICD-10-CM | POA: Diagnosis not present

## 2017-02-26 DIAGNOSIS — D120 Benign neoplasm of cecum: Secondary | ICD-10-CM

## 2017-02-26 DIAGNOSIS — K449 Diaphragmatic hernia without obstruction or gangrene: Secondary | ICD-10-CM | POA: Insufficient documentation

## 2017-02-26 DIAGNOSIS — R404 Transient alteration of awareness: Secondary | ICD-10-CM | POA: Diagnosis not present

## 2017-02-26 DIAGNOSIS — Z8673 Personal history of transient ischemic attack (TIA), and cerebral infarction without residual deficits: Secondary | ICD-10-CM | POA: Insufficient documentation

## 2017-02-26 DIAGNOSIS — Z6836 Body mass index (BMI) 36.0-36.9, adult: Secondary | ICD-10-CM | POA: Insufficient documentation

## 2017-02-26 DIAGNOSIS — R509 Fever, unspecified: Secondary | ICD-10-CM

## 2017-02-26 DIAGNOSIS — K5730 Diverticulosis of large intestine without perforation or abscess without bleeding: Secondary | ICD-10-CM | POA: Insufficient documentation

## 2017-02-26 DIAGNOSIS — F329 Major depressive disorder, single episode, unspecified: Secondary | ICD-10-CM | POA: Diagnosis not present

## 2017-02-26 DIAGNOSIS — E860 Dehydration: Secondary | ICD-10-CM | POA: Insufficient documentation

## 2017-02-26 DIAGNOSIS — D122 Benign neoplasm of ascending colon: Secondary | ICD-10-CM | POA: Diagnosis not present

## 2017-02-26 DIAGNOSIS — I129 Hypertensive chronic kidney disease with stage 1 through stage 4 chronic kidney disease, or unspecified chronic kidney disease: Secondary | ICD-10-CM | POA: Diagnosis not present

## 2017-02-26 DIAGNOSIS — E785 Hyperlipidemia, unspecified: Secondary | ICD-10-CM | POA: Diagnosis present

## 2017-02-26 DIAGNOSIS — R531 Weakness: Secondary | ICD-10-CM | POA: Diagnosis not present

## 2017-02-26 DIAGNOSIS — Q438 Other specified congenital malformations of intestine: Secondary | ICD-10-CM | POA: Diagnosis not present

## 2017-02-26 DIAGNOSIS — D123 Benign neoplasm of transverse colon: Secondary | ICD-10-CM | POA: Diagnosis not present

## 2017-02-26 DIAGNOSIS — E1122 Type 2 diabetes mellitus with diabetic chronic kidney disease: Secondary | ICD-10-CM | POA: Diagnosis not present

## 2017-02-26 DIAGNOSIS — D509 Iron deficiency anemia, unspecified: Secondary | ICD-10-CM | POA: Diagnosis not present

## 2017-02-26 DIAGNOSIS — Z87891 Personal history of nicotine dependence: Secondary | ICD-10-CM | POA: Insufficient documentation

## 2017-02-26 DIAGNOSIS — Z7902 Long term (current) use of antithrombotics/antiplatelets: Secondary | ICD-10-CM | POA: Insufficient documentation

## 2017-02-26 DIAGNOSIS — Z9981 Dependence on supplemental oxygen: Secondary | ICD-10-CM | POA: Insufficient documentation

## 2017-02-26 DIAGNOSIS — R0602 Shortness of breath: Secondary | ICD-10-CM | POA: Diagnosis not present

## 2017-02-26 HISTORY — PX: COLONOSCOPY WITH PROPOFOL: SHX5780

## 2017-02-26 HISTORY — PX: POLYPECTOMY: SHX5525

## 2017-02-26 HISTORY — PX: ESOPHAGOGASTRODUODENOSCOPY (EGD) WITH PROPOFOL: SHX5813

## 2017-02-26 LAB — COMPREHENSIVE METABOLIC PANEL
ALT: 26 U/L (ref 14–54)
AST: 29 U/L (ref 15–41)
Albumin: 3.7 g/dL (ref 3.5–5.0)
Alkaline Phosphatase: 93 U/L (ref 38–126)
Anion gap: 8 (ref 5–15)
BUN: 13 mg/dL (ref 6–20)
CO2: 25 mmol/L (ref 22–32)
Calcium: 9.5 mg/dL (ref 8.9–10.3)
Chloride: 103 mmol/L (ref 101–111)
Creatinine, Ser: 1.58 mg/dL — ABNORMAL HIGH (ref 0.44–1.00)
GFR calc Af Amer: 37 mL/min — ABNORMAL LOW (ref >60)
GFR calc non Af Amer: 32 mL/min — ABNORMAL LOW (ref >60)
Glucose, Bld: 154 mg/dL — ABNORMAL HIGH (ref 65–99)
Potassium: 3.6 mmol/L (ref 3.5–5.1)
Sodium: 136 mmol/L (ref 135–145)
Total Bilirubin: 0.3 mg/dL (ref 0.3–1.2)
Total Protein: 8.4 g/dL — ABNORMAL HIGH (ref 6.5–8.1)

## 2017-02-26 LAB — CBC WITH DIFFERENTIAL/PLATELET
Basophils Absolute: 0 10*3/uL (ref 0.0–0.1)
Basophils Relative: 0 %
Eosinophils Absolute: 0 10*3/uL (ref 0.0–0.7)
Eosinophils Relative: 0 %
HCT: 32.5 % — ABNORMAL LOW (ref 36.0–46.0)
Hemoglobin: 10.6 g/dL — ABNORMAL LOW (ref 12.0–15.0)
Lymphocytes Relative: 6 %
Lymphs Abs: 1.1 10*3/uL (ref 0.7–4.0)
MCH: 28 pg (ref 26.0–34.0)
MCHC: 32.6 g/dL (ref 30.0–36.0)
MCV: 85.8 fL (ref 78.0–100.0)
Monocytes Absolute: 1 10*3/uL (ref 0.1–1.0)
Monocytes Relative: 6 %
Neutro Abs: 14.3 10*3/uL — ABNORMAL HIGH (ref 1.7–7.7)
Neutrophils Relative %: 88 %
Platelets: 326 10*3/uL (ref 150–400)
RBC: 3.79 MIL/uL — ABNORMAL LOW (ref 3.87–5.11)
RDW: 17.9 % — ABNORMAL HIGH (ref 11.5–15.5)
WBC: 16.3 10*3/uL — ABNORMAL HIGH (ref 4.0–10.5)

## 2017-02-26 LAB — URINALYSIS, ROUTINE W REFLEX MICROSCOPIC
Bacteria, UA: NONE SEEN
Bilirubin Urine: NEGATIVE
Glucose, UA: 50 mg/dL — AB
Ketones, ur: NEGATIVE mg/dL
Leukocytes, UA: NEGATIVE
Nitrite: NEGATIVE
Protein, ur: 30 mg/dL — AB
Specific Gravity, Urine: 1.013 (ref 1.005–1.030)
pH: 6 (ref 5.0–8.0)

## 2017-02-26 LAB — GLUCOSE, CAPILLARY
Glucose-Capillary: 123 mg/dL — ABNORMAL HIGH (ref 65–99)
Glucose-Capillary: 145 mg/dL — ABNORMAL HIGH (ref 65–99)

## 2017-02-26 SURGERY — COLONOSCOPY WITH PROPOFOL
Anesthesia: Monitor Anesthesia Care

## 2017-02-26 MED ORDER — MIDAZOLAM HCL 2 MG/2ML IJ SOLN
INTRAMUSCULAR | Status: AC
Start: 2017-02-26 — End: 2017-02-26
  Filled 2017-02-26: qty 2

## 2017-02-26 MED ORDER — SODIUM CHLORIDE 0.9 % IV SOLN
INTRAVENOUS | Status: AC
  Administered 2017-02-27 (×2): via INTRAVENOUS

## 2017-02-26 MED ORDER — ACETAMINOPHEN 650 MG RE SUPP: 650.0000 mg | Freq: Four times a day (QID) | RECTAL | Status: DC | PRN

## 2017-02-26 MED ORDER — MIDAZOLAM HCL 2 MG/2ML IJ SOLN
INTRAMUSCULAR | Status: AC
  Filled 2017-02-26: qty 2

## 2017-02-26 MED ORDER — VANCOMYCIN HCL IN DEXTROSE 1-5 GM/200ML-% IV SOLN
1000.0000 mg | Freq: Once | INTRAVENOUS | Status: AC
  Administered 2017-02-27: 1000 mg via INTRAVENOUS
  Filled 2017-02-26: qty 200

## 2017-02-26 MED ORDER — FENTANYL CITRATE (PF) 100 MCG/2ML IJ SOLN
25.0000 ug | Freq: Once | INTRAMUSCULAR | Status: AC
  Administered 2017-02-26: 25 ug via INTRAVENOUS

## 2017-02-26 MED ORDER — VANCOMYCIN HCL IN DEXTROSE 750-5 MG/150ML-% IV SOLN
750.0000 mg | Freq: Once | INTRAVENOUS | Status: AC
  Administered 2017-02-27: 750 mg via INTRAVENOUS
  Filled 2017-02-26 (×2): qty 150

## 2017-02-26 MED ORDER — LACTATED RINGERS IV SOLN
INTRAVENOUS | Status: DC
  Administered 2017-02-26: 1000 mL via INTRAVENOUS
  Administered 2017-02-26: 09:00:00 via INTRAVENOUS

## 2017-02-26 MED ORDER — PANTOPRAZOLE SODIUM 40 MG PO TBEC
40.0000 mg | DELAYED_RELEASE_TABLET | Freq: Two times a day (BID) | ORAL | Status: DC
  Administered 2017-02-27 – 2017-02-28 (×3): 40 mg via ORAL
  Filled 2017-02-26 (×3): qty 1

## 2017-02-26 MED ORDER — FENTANYL CITRATE (PF) 100 MCG/2ML IJ SOLN
INTRAMUSCULAR | Status: AC
  Filled 2017-02-26: qty 2

## 2017-02-26 MED ORDER — LEVOFLOXACIN IN D5W 750 MG/150ML IV SOLN
750.0000 mg | Freq: Once | INTRAVENOUS | Status: AC
  Administered 2017-02-26: 750 mg via INTRAVENOUS
  Filled 2017-02-26: qty 150

## 2017-02-26 MED ORDER — ONDANSETRON HCL 4 MG/2ML IJ SOLN: 4.0000 mg | Freq: Four times a day (QID) | INTRAMUSCULAR | Status: DC | PRN

## 2017-02-26 MED ORDER — DEXTROSE 5 % IV SOLN
2.0000 g | Freq: Once | INTRAVENOUS | Status: AC
  Administered 2017-02-26: 2 g via INTRAVENOUS
  Filled 2017-02-26: qty 2

## 2017-02-26 MED ORDER — ACETAMINOPHEN 500 MG PO TABS
1000.0000 mg | ORAL_TABLET | Freq: Once | ORAL | Status: AC
  Administered 2017-02-26: 1000 mg via ORAL
  Filled 2017-02-26: qty 2

## 2017-02-26 MED ORDER — LIDOCAINE VISCOUS 2 % MT SOLN
6.0000 mL | Freq: Once | OROMUCOSAL | Status: AC
  Administered 2017-02-26: 6 mL via OROMUCOSAL

## 2017-02-26 MED ORDER — SODIUM CHLORIDE 0.9 % IV BOLUS (SEPSIS)
1000.0000 mL | Freq: Once | INTRAVENOUS | Status: AC
  Administered 2017-02-26: 1000 mL via INTRAVENOUS

## 2017-02-26 MED ORDER — SODIUM CHLORIDE 0.9 % IJ SOLN
PREFILLED_SYRINGE | INTRAMUSCULAR | Status: DC | PRN
  Administered 2017-02-26: 3 mL

## 2017-02-26 MED ORDER — MIDAZOLAM HCL 2 MG/2ML IJ SOLN
1.0000 mg | INTRAMUSCULAR | Status: AC
  Administered 2017-02-26 (×2): 2 mg via INTRAVENOUS

## 2017-02-26 MED ORDER — ONDANSETRON HCL 4 MG/2ML IJ SOLN
INTRAMUSCULAR | Status: AC
  Filled 2017-02-26: qty 2

## 2017-02-26 MED ORDER — ONDANSETRON HCL 4 MG PO TABS: 4.0000 mg | ORAL_TABLET | Freq: Four times a day (QID) | ORAL | Status: DC | PRN

## 2017-02-26 MED ORDER — ACETAMINOPHEN 325 MG PO TABS
650.0000 mg | ORAL_TABLET | Freq: Once | ORAL | Status: AC
  Administered 2017-02-26: 650 mg via ORAL
  Filled 2017-02-26: qty 2

## 2017-02-26 MED ORDER — PRAVASTATIN SODIUM 40 MG PO TABS
40.0000 mg | ORAL_TABLET | Freq: Every day | ORAL | Status: DC
  Administered 2017-02-27 – 2017-02-28 (×2): 40 mg via ORAL
  Filled 2017-02-26 (×3): qty 1

## 2017-02-26 MED ORDER — ASPIRIN EC 325 MG PO TBEC
325.0000 mg | DELAYED_RELEASE_TABLET | Freq: Every day | ORAL | Status: DC
  Administered 2017-02-27 – 2017-02-28 (×2): 325 mg via ORAL
  Filled 2017-02-26 (×2): qty 1

## 2017-02-26 MED ORDER — ACETAMINOPHEN 325 MG PO TABS
650.0000 mg | ORAL_TABLET | Freq: Four times a day (QID) | ORAL | Status: DC | PRN
  Administered 2017-02-27 – 2017-02-28 (×2): 650 mg via ORAL
  Filled 2017-02-26 (×2): qty 2

## 2017-02-26 MED ORDER — LIDOCAINE VISCOUS 2 % MT SOLN
OROMUCOSAL | Status: AC
  Filled 2017-02-26: qty 15

## 2017-02-26 MED ORDER — PAROXETINE HCL 20 MG PO TABS
40.0000 mg | ORAL_TABLET | Freq: Every morning | ORAL | Status: DC
  Administered 2017-02-27 – 2017-02-28 (×2): 40 mg via ORAL
  Filled 2017-02-26 (×2): qty 2

## 2017-02-26 MED ORDER — PROPOFOL 500 MG/50ML IV EMUL
INTRAVENOUS | Status: DC | PRN
  Administered 2017-02-26: 150 ug/kg/min via INTRAVENOUS
  Administered 2017-02-26 (×4): via INTRAVENOUS

## 2017-02-26 MED ORDER — ONDANSETRON HCL 4 MG/2ML IJ SOLN
INTRAMUSCULAR | Status: DC | PRN
  Administered 2017-02-26: 4 mg via INTRAVENOUS

## 2017-02-26 NOTE — ED Provider Notes (Signed)
Patient signed out to me by Dr. Lacinda Axon. I evaluated her at 10:30 PMPatient reports that she fell every time she tried to get up after returning from colonoscopy earlier today. She complained of generalized weakness denies pain anywhere denies shortness of breath. No other associated symptoms.on exam she is alert nontoxic appearing lungs clear auscultation heart regular rate and rhythm abdomen obese, nontender extremities without edema.code sepsis called based on Sirs criteria of fever, tachycardia. Source of infection unknown. ED ECG REPORT   Date: 02/26/2017  Rate: 100  Rhythm: sinus tachycardia  QRS Axis: normal  Intervals: normal  ST/T Wave abnormalities: normal  Conduction Disutrbances:none  Narrative Interpretation:   Old EKG Reviewed: increased over 03/07/2016, otherwise no significant change  I have personally reviewed the EKG tracing and agree with the computerized printout as noted. Sepsis - Repeat Assessment  Performed at:    1115 pm  Vitals     Blood pressure 113/68, pulse 100, temperature 98.4 F (36.9 C), temperature source Oral, resp. rate (!) 23, height 5' 5.5" (1.664 m), weight 93.4 kg (206 lb), SpO2 91 %.  Heart:     Regular rate and rhythm  Lungs:    CTA  Capillary Refill:   <2 sec  Peripheral Pulse:   Radial pulse palpable  Skin:     Normal Color  chest x-ray viewed by me Results for orders placed or performed during the hospital encounter of 02/26/17  CBC with Differential  Result Value Ref Range   WBC 16.3 (H) 4.0 - 10.5 K/uL   RBC 3.79 (L) 3.87 - 5.11 MIL/uL   Hemoglobin 10.6 (L) 12.0 - 15.0 g/dL   HCT 32.5 (L) 36.0 - 46.0 %   MCV 85.8 78.0 - 100.0 fL   MCH 28.0 26.0 - 34.0 pg   MCHC 32.6 30.0 - 36.0 g/dL   RDW 17.9 (H) 11.5 - 15.5 %   Platelets 326 150 - 400 K/uL   Neutrophils Relative % 88 %   Neutro Abs 14.3 (H) 1.7 - 7.7 K/uL   Lymphocytes Relative 6 %   Lymphs Abs 1.1 0.7 - 4.0 K/uL   Monocytes Relative 6 %   Monocytes Absolute 1.0 0.1 - 1.0  K/uL   Eosinophils Relative 0 %   Eosinophils Absolute 0.0 0.0 - 0.7 K/uL   Basophils Relative 0 %   Basophils Absolute 0.0 0.0 - 0.1 K/uL  Comprehensive metabolic panel  Result Value Ref Range   Sodium 136 135 - 145 mmol/L   Potassium 3.6 3.5 - 5.1 mmol/L   Chloride 103 101 - 111 mmol/L   CO2 25 22 - 32 mmol/L   Glucose, Bld 154 (H) 65 - 99 mg/dL   BUN 13 6 - 20 mg/dL   Creatinine, Ser 1.58 (H) 0.44 - 1.00 mg/dL   Calcium 9.5 8.9 - 10.3 mg/dL   Total Protein 8.4 (H) 6.5 - 8.1 g/dL   Albumin 3.7 3.5 - 5.0 g/dL   AST 29 15 - 41 U/L   ALT 26 14 - 54 U/L   Alkaline Phosphatase 93 38 - 126 U/L   Total Bilirubin 0.3 0.3 - 1.2 mg/dL   GFR calc non Af Amer 32 (L) >60 mL/min   GFR calc Af Amer 37 (L) >60 mL/min   Anion gap 8 5 - 15  Urinalysis, Routine w reflex microscopic  Result Value Ref Range   Color, Urine YELLOW YELLOW   APPearance CLEAR CLEAR   Specific Gravity, Urine 1.013 1.005 - 1.030  pH 6.0 5.0 - 8.0   Glucose, UA 50 (A) NEGATIVE mg/dL   Hgb urine dipstick SMALL (A) NEGATIVE   Bilirubin Urine NEGATIVE NEGATIVE   Ketones, ur NEGATIVE NEGATIVE mg/dL   Protein, ur 30 (A) NEGATIVE mg/dL   Nitrite NEGATIVE NEGATIVE   Leukocytes, UA NEGATIVE NEGATIVE   RBC / HPF 0-5 0 - 5 RBC/hpf   WBC, UA 0-5 0 - 5 WBC/hpf   Bacteria, UA NONE SEEN NONE SEEN   Squamous Epithelial / LPF 0-5 (A) NONE SEEN   Mucus PRESENT    *Note: Due to a large number of results and/or encounters for the requested time period, some results have not been displayed. A complete set of results can be found in Results Review.   Dg Chest 2 View  Result Date: 02/26/2017 CLINICAL DATA:  Weakness, shortness of breath EXAM: CHEST  2 VIEW COMPARISON:  03/07/2016 FINDINGS: Lower lung volumes with increased bibasilar atelectasis. Normal heart size with mild vascular congestion centrally. No large effusion or pneumothorax. Trachea is midline. Degenerative changes of the spine. IMPRESSION: Lower lung volumes with  bibasilar atelectasis. Electronically Signed   By: Jerilynn Mages.  Shick M.D.   On: 02/26/2017 20:05   Ct Biopsy  Result Date: 02/19/2017 INDICATION: Monoclonal paraproteinemia. Please perform CT-guided bone marrow biopsy for tissue diagnostic purposes. EXAM: CT-GUIDED BONE MARROW BIOPSY AND ASPIRATION MEDICATIONS: None ANESTHESIA/SEDATION: Fentanyl 200 mcg IV; Versed 4 mg IV Sedation Time: 11 minutes; The patient was continuously monitored during the procedure by the interventional radiology nurse under my direct supervision. COMPLICATIONS: None immediate. PROCEDURE: Informed consent was obtained from the patient following an explanation of the procedure, risks, benefits and alternatives. The patient understands, agrees and consents for the procedure. All questions were addressed. A time out was performed prior to the initiation of the procedure. The patient was positioned prone and non-contrast localization CT was performed of the pelvis to demonstrate the iliac marrow spaces. The operative site was prepped and draped in the usual sterile fashion. Under sterile conditions and local anesthesia, a 22 gauge spinal needle was utilized for procedural planning. Next, an 11 gauge coaxial bone biopsy needle was advanced into the left iliac marrow space. Needle position was confirmed with CT imaging. Initially, bone marrow aspiration was performed. Next, a bone marrow biopsy was obtained with the 11 gauge outer bone marrow device. Samples were prepared with the cytotechnologist and deemed adequate. The needle was removed intact. Hemostasis was obtained with compression and a dressing was placed. The patient tolerated the procedure well without immediate post procedural complication. IMPRESSION: Successful CT guided left iliac bone marrow aspiration and core biopsy. Electronically Signed   By: Sandi Mariscal M.D.   On: 02/19/2017 14:22   Dg Bone Survey Met  Result Date: 02/15/2017 CLINICAL DATA:  Monoclonal paraproteinemia. EXAM:  METASTATIC BONE SURVEY COMPARISON:  CT scan of August 19, 2015. FINDINGS: There is no definite evidence of lytic lesion or other significant osseous abnormality involving the skull, spine, extremities, pelvis or ribcage. IMPRESSION: No definite lytic lesion seen in the visualized skeleton. Electronically Signed   By: Marijo Conception, M.D.   On: 02/15/2017 15:23   Ct Bone Marrow Biopsy & Aspiration  Result Date: 02/19/2017 INDICATION: Monoclonal paraproteinemia. Please perform CT-guided bone marrow biopsy for tissue diagnostic purposes. EXAM: CT-GUIDED BONE MARROW BIOPSY AND ASPIRATION MEDICATIONS: None ANESTHESIA/SEDATION: Fentanyl 200 mcg IV; Versed 4 mg IV Sedation Time: 11 minutes; The patient was continuously monitored during the procedure by the interventional radiology nurse under  my direct supervision. COMPLICATIONS: None immediate. PROCEDURE: Informed consent was obtained from the patient following an explanation of the procedure, risks, benefits and alternatives. The patient understands, agrees and consents for the procedure. All questions were addressed. A time out was performed prior to the initiation of the procedure. The patient was positioned prone and non-contrast localization CT was performed of the pelvis to demonstrate the iliac marrow spaces. The operative site was prepped and draped in the usual sterile fashion. Under sterile conditions and local anesthesia, a 22 gauge spinal needle was utilized for procedural planning. Next, an 11 gauge coaxial bone biopsy needle was advanced into the left iliac marrow space. Needle position was confirmed with CT imaging. Initially, bone marrow aspiration was performed. Next, a bone marrow biopsy was obtained with the 11 gauge outer bone marrow device. Samples were prepared with the cytotechnologist and deemed adequate. The needle was removed intact. Hemostasis was obtained with compression and a dressing was placed. The patient tolerated the procedure well  without immediate post procedural complication. IMPRESSION: Successful CT guided left iliac bone marrow aspiration and core biopsy. Electronically Signed   By: Sandi Mariscal M.D.   On: 02/19/2017 14:22  renal insufficiency is chronic.Anemia is chronic hospitalist service consulted and will arrange for overnight stay to telemetry Diagnosis #1 sepsis #2 chronic renal insufficiency #3 anemia #4 hyperglycemia   Orlie Dakin, MD 02/26/17 2319

## 2017-02-26 NOTE — Discharge Instructions (Signed)
You had MORE THAN 10 polyps removed FROM YOUR COLON. You have internal hemorrhoids & DIVERTICULOSIS IN YOUR LEFT COLON. You have A HIATAL HERNIA, gastritis AND BENIGN STOMACH POLYPS.      IN 30 DAYS THE METAL CLIP PLACED IN THE COLON SHOULD FALL OFF. IF YOU HAVE AN MRI LET THE MRI TECH KNOW YOU HAD A METAL CLIP PLACED IN YOUR COLON.  CONTINUE YOUR WEIGHT LOSS EFFORTS.   DRINK WATER TO KEEP YOUR URINE LIGHT YELLOW.  FOLLOW A HIGH FIBER/LOW FAT DIET. AVOID ITEMS THAT CAUSE BLOATING. SEE INFO BELOW.  YOUR BIOPSY RESULTS WILL BE AVAILABLE IN 10-14 DAYS WITH YOUR RESULTS.   Next colonoscopy in 1-3 years.   FOLLOW UP IN 4 MOS.   ENDOSCOPY Care After Read the instructions outlined below and refer to this sheet in the next week. These discharge instructions provide you with general information on caring for yourself after you leave the hospital. While your treatment has been planned according to the most current medical practices available, unavoidable complications occasionally occur. If you have any problems or questions after discharge, call DR. FIELDS, [phone].  ACTIVITY  You may resume your regular activity, but move at a slower pace for the next 24 hours.   Take frequent rest periods for the next 24 hours.   Walking will help get rid of the air and reduce the bloated feeling in your belly (abdomen).   No driving for 24 hours (because of the medicine (anesthesia) used during the test).   You may shower.   Do not sign any important legal documents or operate any machinery for 24 hours (because of the anesthesia used during the test).    NUTRITION  Drink plenty of fluids.   You may resume your normal diet as instructed by your doctor.   Begin with a light meal and progress to your normal diet. Heavy or fried foods are harder to digest and may make you feel sick to your stomach (nauseated).   Avoid alcoholic beverages for 24 hours or as instructed.    MEDICATIONS  You  may resume your normal medications.   WHAT YOU CAN EXPECT TODAY  Some feelings of bloating in the abdomen.   Passage of more gas than usual.   Spotting of blood in your stool or on the toilet paper  .  IF YOU HAD POLYPS REMOVED DURING THE ENDOSCOPY:  Eat a soft diet IF YOU HAVE NAUSEA, BLOATING, ABDOMINAL PAIN, OR VOMITING.    FINDING OUT THE RESULTS OF YOUR TEST Not all test results are available during your visit. DR. Oneida Alar WILL CALL YOU WITHIN 14 DAYS OF YOUR PROCEDUE WITH YOUR RESULTS. Do not assume everything is normal if you have not heard from DR. FIELDS IN ONE WEEK, CALL HER OFFICE AT [phone].  SEEK IMMEDIATE MEDICAL ATTENTION AND CALL THE OFFICE: [phone] IF:  You have more than a spotting of blood in your stool.   Your belly is swollen (abdominal distention).   You are nauseated or vomiting.   You have a temperature over 101F.   You have abdominal pain or discomfort that is severe or gets worse throughout the day.   Colonoscopy, Adult, Care After This sheet gives you information about how to care for yourself after your procedure. Your health care provider may also give you more specific instructions. If you have problems or questions, contact your health care provider. What can I expect after the procedure? After the procedure, it is common to have:  A small amount of blood in your stool for 24 hours after the procedure.  Some gas.  Mild abdominal cramping or bloating.  Follow these instructions at home: General instructions   For the first 24 hours after the procedure: ? Do not drive or use machinery. ? Do not sign important documents. ? Do not drink alcohol. ? Do your regular daily activities at a slower pace than normal. ? Eat soft, easy-to-digest foods. ? Rest often.  Take over-the-counter or prescription medicines only as told by your health care provider.  It is up to you to get the results of your procedure. Ask your health care  provider, or the department performing the procedure, when your results will be ready. Relieving cramping and bloating  Try walking around when you have cramps or feel bloated.  Apply heat to your abdomen as told by your health care provider. Use a heat source that your health care provider recommends, such as a moist heat pack or a heating pad. ? Place a towel between your skin and the heat source. ? Leave the heat on for 20-30 minutes. ? Remove the heat if your skin turns bright red. This is especially important if you are unable to feel pain, heat, or cold. You may have a greater risk of getting burned. Eating and drinking  Drink enough fluid to keep your urine clear or pale yellow.  Resume your normal diet as instructed by your health care provider. Avoid heavy or fried foods that are hard to digest.  Avoid drinking alcohol for as long as instructed by your health care provider. Contact a health care provider if:  You have blood in your stool 2-3 days after the procedure. Get help right away if:  You have more than a small spotting of blood in your stool.  You pass large blood clots in your stool.  Your abdomen is swollen.  You have nausea or vomiting.  You have a fever.  You have increasing abdominal pain that is not relieved with medicine. This information is not intended to replace advice given to you by your health care provider. Make sure you discuss any questions you have with your health care provider. Document Released: 01/10/2004 Document Revised: 02/20/2016 Document Reviewed: 08/09/2015 Elsevier Interactive Patient Education  2018 [employer].    High-Fiber Diet A high-fiber diet changes your normal diet to include more whole grains, legumes, fruits, and vegetables. Changes in the diet involve replacing refined carbohydrates with unrefined foods. The calorie level of the diet is essentially unchanged. The Dietary Reference Intake (recommended amount) for adult  males is 38 grams per day. For adult females, it is 25 grams per day. Pregnant and lactating women should consume 28 grams of fiber per day.Fiber is the intact part of a plant that is not broken down during digestion. Functional fiber is fiber that has been isolated from the plant to provide a beneficial effect in the body.  PURPOSE  Increase stool bulk.   Ease and regulate bowel movements.   Lower cholesterol.   REDUCE RISK OF COLON CANCER  INDICATIONS THAT YOU NEED MORE FIBER  Constipation and hemorrhoids.   Uncomplicated diverticulosis (intestine condition) and irritable bowel syndrome.   Weight management.   As a protective measure against hardening of the arteries (atherosclerosis), diabetes, and cancer.   GUIDELINES FOR INCREASING FIBER IN THE DIET  Start adding fiber to the diet slowly. A gradual increase of about 5 more grams (2 slices of whole-wheat bread, 2 servings  of most fruits or vegetables, or 1 bowl of high-fiber cereal) per day is best. Too rapid an increase in fiber may result in constipation, flatulence, and bloating.   Drink enough water and fluids to keep your urine clear or pale yellow. Water, juice, or caffeine-free drinks are recommended. Not drinking enough fluid may cause constipation.   Eat a variety of high-fiber foods rather than one type of fiber.   Try to increase your intake of fiber through using high-fiber foods rather than fiber pills or supplements that contain small amounts of fiber.   The goal is to change the types of food eaten. Do not supplement your present diet with high-fiber foods, but replace foods in your present diet.   INCLUDE A VARIETY OF FIBER SOURCES  Replace refined and processed grains with whole grains, canned fruits with fresh fruits, and incorporate other fiber sources. White rice, white breads, and most bakery goods contain little or no fiber.   Brown whole-grain rice, buckwheat oats, and many fruits and vegetables are  all good sources of fiber. These include: broccoli, Brussels sprouts, cabbage, cauliflower, beets, sweet potatoes, white potatoes (skin on), carrots, tomatoes, eggplant, squash, berries, fresh fruits, and dried fruits.   Cereals appear to be the richest source of fiber. Cereal fiber is found in whole grains and bran. Bran is the fiber-rich outer coat of cereal grain, which is largely removed in refining. In whole-grain cereals, the bran remains. In breakfast cereals, the largest amount of fiber is found in those with "bran" in their names. The fiber content is sometimes indicated on the label.   You may need to include additional fruits and vegetables each day.   In baking, for 1 cup white flour, you may use the following substitutions:   1 cup whole-wheat flour minus 2 tablespoons.   1/2 cup white flour plus 1/2 cup whole-wheat flour.  Polyps, Colon  A polyp is extra tissue that grows inside your body. Colon polyps grow in the large intestine. The large intestine, also called the colon, is part of your digestive system. It is a long, hollow tube at the end of your digestive tract where your body makes and stores stool.Most polyps are not dangerous. They are benign. This means they are not cancerous. But over time, some types of polyps can turn into cancer. Polyps that are smaller than a pea are usually not harmful. But larger polyps could someday become or may already be cancerous. To be safe, doctors remove all polyps and test them.    PREVENTION There is not one sure way to prevent polyps. You might be able to lower your risk of getting them if you:  Eat more fruits and vegetables and less fatty food.   Do not smoke.   Avoid alcohol.   Exercise every day.   Lose weight if you are overweight.   Eating more calcium and folate can also lower your risk of getting polyps. Some foods that are rich in calcium are milk, cheese, and broccoli. Some foods that are rich in folate are chickpeas,  kidney beans, and spinach.    Hiatal Hernia A hiatal hernia occurs when part of the stomach slides above the muscle that separates the abdomen from the chest (diaphragm). A person can be born with a hiatal hernia (congenital), or it may develop over time. In almost all cases of hiatal hernia, only the top part of the stomach pushes through the diaphragm. Many people have a hiatal hernia with no symptoms.  The larger the hernia, the more likely it is that you will have symptoms. In some cases, a hiatal hernia allows stomach acid to flow back into the tube that carries food from your mouth to your stomach (esophagus). This may cause heartburn symptoms. Severe heartburn symptoms may mean that you have developed a condition called gastroesophageal reflux disease (GERD). What are the causes? This condition is caused by a weakness in the opening (hiatus) where the esophagus passes through the diaphragm to attach to the upper part of the stomach. A person may be born with a weakness in the hiatus, or a weakness can develop over time. What increases the risk? This condition is more likely to develop in:  Older people. Age is a major risk factor for a hiatal hernia, especially if you are over the age of 26.  Pregnant women.  People who are overweight.  People who have frequent constipation.  What are the signs or symptoms? Symptoms of this condition usually develop in the form of GERD symptoms. Symptoms include:  Heartburn.  Belching.  Indigestion.  Trouble swallowing.  Coughing or wheezing.  Sore throat.  Hoarseness.  Chest pain.  Nausea and vomiting.  How is this diagnosed? This condition may be diagnosed during testing for GERD. Tests that may be done include:  X-rays of your stomach or chest.  An upper gastrointestinal (GI) series. This is an X-ray exam of your GI tract that is taken after you swallow a chalky liquid that shows up clearly on the X-ray.  Endoscopy. This is a  procedure to look into your stomach using a thin, flexible tube that has a tiny camera and light on the end of it.  How is this treated? This condition may be treated by:  Dietary and lifestyle changes to help reduce GERD symptoms.  Medicines. These may include: ? Over-the-counter antacids. ? Medicines that make your stomach empty more quickly. ? Medicines that block the production of stomach acid (H2 blockers). ? Stronger medicines to reduce stomach acid (proton pump inhibitors).  Surgery to repair the hernia, if other treatments are not helping.  If you have no symptoms, you may not need treatment. Follow these instructions at home: Lifestyle and activity  Do not use any products that contain nicotine or tobacco, such as cigarettes and e-cigarettes. If you need help quitting, ask your health care provider.  Try to achieve and maintain a healthy body weight.  Avoid putting pressure on your abdomen. Anything that puts pressure on your abdomen increases the amount of acid that may be pushed up into your esophagus. ? Avoid bending over, especially after eating. ? Raise the head of your bed by putting blocks under the legs. This keeps your head and esophagus higher than your stomach. ? Do not wear tight clothing around your chest or stomach. ? Try not to strain when having a bowel movement, when urinating, or when lifting heavy objects. Eating and drinking  Avoid foods that can worsen GERD symptoms. These may include: ? Fatty foods, like fried foods. ? Citrus fruits, like oranges or lemon. ? Other foods and drinks that contain acid, like orange juice or tomatoes. ? Spicy food. ? Chocolate.  Eat frequent small meals instead of three large meals a day. This helps prevent your stomach from getting too full. ? Eat slowly. ? Do not lie down right after eating. ? Do not eat 1-2 hours before bed.  Do not drink beverages with caffeine. These include cola, coffee, cocoa, and tea.  Do  not drink alcohol. General instructions  Take over-the-counter and prescription medicines only as told by your health care provider.  Keep all follow-up visits as told by your health care provider. This is important. Contact a health care provider if:  Your symptoms are not controlled with medicines or lifestyle changes.  You are having trouble swallowing.  You have coughing or wheezing that will not go away. Get help right away if:  Your pain is getting worse.  Your pain spreads to your arms, neck, jaw, teeth, or back.  You have shortness of breath.  You sweat for no reason.  You feel sick to your stomach (nauseous) or you vomit.  You vomit blood.  You have bright red blood in your stools.  You have black, tarry stools. This information is not intended to replace advice given to you by your health care provider. Make sure you discuss any questions you have with your health care provider. Document Released: 08/18/2003 Document Revised: 05/21/2016 Document Reviewed: 05/21/2016 Elsevier Interactive Patient Education  [employer].

## 2017-02-26 NOTE — Progress Notes (Signed)
Dr Oneida Alar notified that  pt states she ate at "0930" this AM. No new orders given. Will proceed with procedure.

## 2017-02-26 NOTE — H&P (Signed)
Primary Care Physician:  [name], MD Primary Gastroenterologist:  Dr. Oneida Alar  Pre-Procedure History & Physical: HPI:  Cheryl Abbott is a 71 y.o. female here for [place].  Past Medical History:  Diagnosis Date  . Anemia   . Anxiety   . Anxiety disorder   . Arthritis   . 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 Chronic back pain   . Depression   . Fatty liver   . GERD (gastroesophageal reflux disease)   . Headache   . History of adenomatous polyp of colon    2008  . History of kidney stones   . Hypercalcemia   . Hyperlipidemia   . Hypertension   . Left ureteral stone   . Lipoma of arm 01/19/2015  . Lumbar disc disease with radiculopathy   . Nephrolithiasis   . Neuropathy, peripheral   . OSA treated with BiPAP    per study 2007  . Sciatica   . Sigmoid diverticulosis   . Type 2 diabetes mellitus ([place])     Past Surgical History:  Procedure Laterality Date  . BIOPSY N/A 03/17/2013   Procedure: GASTRIC BIOPSIES;  Surgeon: Danie Binder, MD;  Location: [facility];  Service: Endoscopy;  Laterality: N/A;  . BIOPSY  06/10/2015   Procedure: BIOPSY;  Surgeon: Danie Binder, MD;  Location: [facility];  Service: Endoscopy;;  gastric biopsy  . CARDIAC CATHETERIZATION  05-11-2003  dr Shelva Majestic ([facility])   Abnormal cardiolite/   Normal coronary arteries and normal LVF,  ef  63%  . CARDIOVASCULAR STRESS TEST  01-01-2014   normal lexiscan cardiolite/  no ischemia/ infarct/  normal LV function and wall motion ,  ef 81%  . COLONOSCOPY  last one 10/02/2011   [facility] [place] TICS, IH, NEXT TCS APR 2018  . CYSTO/   RIGHT URETEROSOCOPY LASER LITHOTRIPSY STONE EXTRACTION  10-13-2004  . CYSTO/  RIGHT RETROGRADE PYELOGRAM/  PLACMENT RIGHT URETERAL STENT  01-10-2010  . CYSTOSCOPY W/ URETERAL STENT PLACEMENT Right 08/19/2015   Procedure: CYSTOSCOPY WITH RETROGRADE PYELOGRAM/URETERAL STENT PLACEMENT;  Surgeon: Franchot Gallo, MD;  Location: [facility];   Service: Urology;  Laterality: Right;  . CYSTOSCOPY WITH RETROGRADE PYELOGRAM, URETEROSCOPY AND STENT PLACEMENT Left 10/21/2014   Procedure: CYSTOSCOPY WITH RETROGRADE PYELOGRAM, URETEROSCOPY AND STENT PLACEMENT;  Surgeon: Franchot Gallo, MD;  Location: [facility];  Service: Urology;  Laterality: Left;  . CYSTOSCOPY WITH RETROGRADE PYELOGRAM, URETEROSCOPY AND STENT PLACEMENT Right 11/22/2015   Procedure: CYSTOSCOPY, RIGHT URETERAL STENT REMOVAL; RIGHT RETROGRADE PYELOGRAM, RIGHT URETEROSCOPY WITH BALLOON DILATION; RIGHT URETERAL STENT PLACEMENT;  Surgeon: Franchot Gallo, MD;  Location: [facility];  Service: Urology;  Laterality: Right;  . CYSTOSCOPY/RETROGRADE/URETEROSCOPY/STONE EXTRACTION WITH BASKET Bilateral 08/02/2015   Procedure: CYSTOSCOPY; BILATERAL RETROGRADE PYELOGRAMS; BILATERAL URETEROSCOPY, STONE EXTRACTION WITH BASKET;  Surgeon: Franchot Gallo, MD;  Location: [facility];  Service: Urology;  Laterality: Bilateral;  . CYSTOSCOPY/RETROGRADE/URETEROSCOPY/STONE EXTRACTION WITH BASKET Right 06/28/2015   Procedure: CYSTOSCOPY/RETROGRADE/URETEROSCOPY/STONE EXTRACTION WITH BASKET, RIGHT URETERAL DOUBLE J STENT PLACEMENT;  Surgeon: Franchot Gallo, MD;  Location: [facility];  Service: Urology;  Laterality: Right;  . ESOPHAGOGASTRODUODENOSCOPY (EGD) WITH PROPOFOL N/A 03/17/2013   Procedure: ESOPHAGOGASTRODUODENOSCOPY (EGD) WITH PROPOFOL;  Surgeon: Danie Binder, MD;  Location: [facility];  Service: Endoscopy;  Laterality: N/A;  . ESOPHAGOGASTRODUODENOSCOPY (EGD) WITH PROPOFOL N/A 06/10/2015   Distal gastritis, distal esophageal stricture s/p dilation  . FLEXIBLE SIGMOIDOSCOPY  09/11/2011   TWS:[id number] Internal hemorrhoids  . HOLMIUM LASER APPLICATION Left 7/00/1749  Procedure: HOLMIUM LASER APPLICATION;  Surgeon: Franchot Gallo, MD;  Location: [facility];  Service: Urology;  Laterality: Left;  . HOLMIUM LASER APPLICATION Right 11/15/3708   Procedure: HOLMIUM LASER  APPLICATION;  Surgeon: Franchot Gallo, MD;  Location: [facility];  Service: Urology;  Laterality: Right;  . MASS EXCISION Left 03/04/2015   Procedure: EXCISION OF SOFT TISSUE NEOPLASM LEFT ARM;  Surgeon: Aviva Signs, MD;  Location: [facility];  Service: General;  Laterality: Left;  . PERCUTANEOUS NEPHROSTOLITHOTOMY Bilateral 12/  2011     [facility]  . POLYPECTOMY N/A 03/17/2013   Procedure: GASTRIC POLYPECTOMY;  Surgeon: Danie Binder, MD;  Location: [facility];  Service: Endoscopy;  Laterality: N/A;  . REMOVAL RIGHT THIGH CYST  2006  . SAVORY DILATION N/A 03/17/2013   Procedure: SAVORY DILATION;  Surgeon: Danie Binder, MD;  Location: [facility];  Service: Endoscopy;  Laterality: N/A;  #12.8, 14, 15, 16 dilators used  . SAVORY DILATION N/A 06/10/2015   Procedure: SAVORY DILATION;  Surgeon: Danie Binder, MD;  [facility];  Service: Endoscopy;  Laterality: N/A;  . TRANSTHORACIC ECHOCARDIOGRAM  01-01-2014   mild LVH/  ef 62-69%/  grade I diastolic dysfunction/  trivial MR, TR, and PR  . VAGINAL HYSTERECTOMY  1970's    Prior to Admission medications   Medication Sig Start Date End Date Taking? Authorizing Provider  ALPRAZolam Duanne Moron) 0.5 MG tablet Take 1 tablet (0.5 mg total) by mouth 2 (two) times daily. 10/11/16  Yes [name], MD  amLODipine (NORVASC) 10 MG tablet TAKE 1 TABLET (10 MG TOTAL) BY MOUTH DAILY. 12/24/16  Yes [name], MD  Cholecalciferol (VITAMIN D3) 5000 units CAPS Take 1 capsule by mouth 2 (two) times daily.   Yes [provider]  diphenhydramine-acetaminophen (TYLENOL PM) 25-500 MG TABS tablet Take 2 tablets by mouth at bedtime.   Yes [provider]  lovastatin (MEVACOR) 40 MG tablet TAKE 2 TABLETS (80 MG TOTAL) BY MOUTH AT BEDTIME. 01/14/17  Yes [name], MD  pantoprazole (PROTONIX) 40 MG tablet TAKE 1 TABLET (40 MG TOTAL) BY MOUTH 2 (TWO) TIMES DAILY. 09/03/16  Yes [name], MD  PARoxetine (PAXIL) 40 MG tablet TAKE 1  TABLET BY MOUTH EVERY MORNING 12/07/16  Yes [name], MD  TRADJENTA 5 MG TABS tablet TAKE 1 TABLET BY MOUTH EVERY DAY 02/13/17  Yes [name], MD  ciprofloxacin (CIPRO) 500 MG tablet Take 1 tablet (500 mg total) by mouth 2 (two) times daily. 02/12/17   [name], MD  clopidogrel (PLAVIX) 75 MG tablet Take 75 mg by mouth daily. 10/19/16   [provider]  docusate sodium (COLACE) 100 MG capsule Take 200 mg by mouth at bedtime.     [provider]  iron polysaccharides (NIFEREX) 150 MG capsule Take 150 mg by mouth daily.    [provider]  polyethylene glycol-electrolytes (TRILYTE) 420 g solution Take 4,000 mLs by mouth as directed. 01/30/17   Florita Nitsch, Marga Melnick, MD  triamcinolone cream (KENALOG) 0.1 % Apply 1 application topically 3 (three) times daily. 12/11/16   Triplett, Tammy, PA-C    Allergies as of 01/30/2017 - Review Complete 01/30/2017  Allergen Reaction Noted  . Ace inhibitors Cough 12/11/2010  . Keflex [cephalexin] Diarrhea and Nausea And Vomiting 07/24/2011  . Nitrofurantoin Diarrhea and Nausea And Vomiting 09/08/2009  . Penicillins Hives and Other (See Comments) 09/17/2007    Family History  Problem Relation Age of  Onset  . Hypertension Mother   . Diabetes Mother   . Heart failure Mother   . Dementia Mother   . Emphysema Father   . Hypertension Father   . Diabetes Brother   . GER disease Brother   . Hypertension Sister   . Hypertension Sister   . Cancer Unknown        family history   . Diabetes Unknown        family history   . Heart defect Unknown        famiily history   . Arthritis Unknown        family history   . Anesthesia problems Neg Hx   . Hypotension Neg Hx   . Malignant hyperthermia Neg Hx   . Pseudochol deficiency Neg Hx   . Colon cancer Neg Hx     Social History   Social History  . Marital status: Divorced    Spouse name: N/A  . Number of children: 2  . Years of education: N/A   Occupational  History  . retired     Social History Main Topics  . Smoking status: Former Smoker    Packs/day: 1.00    Years: 20.00    Types: Cigarettes    Start date: 09/21/1974    Quit date: 09/21/1994  . Smokeless tobacco: Never Used     Comment: Quit x 20 years  . Alcohol use No  . Drug use: No  . Sexual activity: No   Other Topics Concern  . Not on file   Social History Narrative   Patient is right handed   Patient drinks some caffeine daily.    Review of Systems: See HPI, otherwise negative ROS   Physical Exam: BP 118/74   Pulse 94   Temp 98.2 F (36.8 C)   Resp (!) 23   SpO2 93%  General:   Alert,  pleasant and cooperative in NAD Head:  Normocephalic and atraumatic. Neck:  Supple; Lungs:  Clear throughout to auscultation.    Heart:  Regular rate and rhythm. Abdomen:  Soft, nontender and nondistended. Normal bowel sounds, without guarding, and without rebound.   Neurologic:  Alert and  oriented x4;  grossly normal neurologically.  Impression/Plan:    IRON DEFICIENCY ANEMIA.  PLAN: 1. TCS/EGD TODAY.  DISCUSSED PROCEDURE, BENEFITS, & RISKS: < 1% chance of medication reaction, bleeding, perforation, or rupture of spleen/liver.

## 2017-02-26 NOTE — Anesthesia Preprocedure Evaluation (Signed)
Anesthesia Evaluation  Patient identified by MRN, date of birth, ID band Patient awake    Reviewed: Allergy & Precautions, NPO status , Patient's Chart, lab work & pertinent test results  History of Anesthesia Complications Negative for: history of anesthetic complications  Airway Mallampati: III  TM Distance: >3 FB     Dental  (+) Teeth Intact   Pulmonary neg pulmonary ROS, sleep apnea , former smoker,    Pulmonary exam normal        Cardiovascular Exercise Tolerance: Poor hypertension, Pt. on medications Normal cardiovascular exam     Neuro/Psych  Headaches, PSYCHIATRIC DISORDERS Anxiety Depression TIA Neuromuscular disease negative neurological ROS     GI/Hepatic GERD  Medicated,  Endo/Other  diabetes, Well Controlled, Type 2  Renal/GU Renal InsufficiencyRenal disease     Musculoskeletal negative musculoskeletal ROS (+) Arthritis ,   Abdominal Normal abdominal exam  (+)   Peds  Hematology negative hematology ROS (+)   Anesthesia Other Findings   Reproductive/Obstetrics negative OB ROS                             Anesthesia Physical Anesthesia Plan  ASA: III  Anesthesia Plan: MAC   Post-op Pain Management:    Induction: Intravenous  PONV Risk Score and Plan:   Airway Management Planned: Simple Face Mask  Additional Equipment:   Intra-op Plan:   Post-operative Plan:   Informed Consent: I have reviewed the patients History and Physical, chart, labs and discussed the procedure including the risks, benefits and alternatives for the proposed anesthesia with the patient or authorized representative who has indicated his/her understanding and acceptance.     Plan Discussed with: CRNA and Surgeon  Anesthesia Plan Comments:         Anesthesia Quick Evaluation

## 2017-02-26 NOTE — Anesthesia Procedure Notes (Signed)
Procedure Name: MAC Date/Time: 02/26/2017 8:27 AM Performed by: Andree Elk, Danis Pembleton A Pre-anesthesia Checklist: Patient identified, Emergency Drugs available, Suction available, Patient being monitored and Timeout performed Oxygen Delivery Method: Simple face mask

## 2017-02-26 NOTE — Progress Notes (Signed)
Pharmacy Antibiotic Note  Cheryl Abbott is a 71 y.o. female admitted on 02/26/2017 with sepsis.  Pharmacy has been consulted for vancomycin, aztreonam and levaquin dosing.Initial doses ordered in the ED.  Plan: Vancomycin 750 mg for total load 1750 mg then 1250 mg IV q24 hours Aztreonam 1 gm IV q8 hours Levaquin 750 mg IV q48 hours F/u renal function, cultures and clinical course  Height: 5' 5.5" (166.4 cm) Weight: 206 lb (93.4 kg) IBW/kg (Calculated) : 58.15  Temp (24hrs), Avg:98.7 F (37.1 C), Min:97.4 F (36.3 C), Max:101.9 F (38.8 C)   Recent Labs Lab 02/26/17 1928  WBC 16.3*  CREATININE 1.58*    Estimated Creatinine Clearance: 36.7 mL/min (A) (by C-G formula based on SCr of 1.58 mg/dL (H)).    Allergies  Allergen Reactions  . Ace Inhibitors Cough  . Keflex [Cephalexin] Diarrhea and Nausea And Vomiting  . Nitrofurantoin Diarrhea and Nausea And Vomiting  . Penicillins Hives and Other (See Comments)    Reaction:  Blisters on hands/feet  Has patient had a PCN reaction causing immediate rash, facial/tongue/throat swelling, SOB or lightheadedness with hypotension: No Has patient had a PCN reaction causing severe rash involving mucus membranes or skin necrosis: No Has patient had a PCN reaction that required hospitalization No Has patient had a PCN reaction occurring within the last 10 years: No If all of the above answers are "NO", then may proceed with Cephalosporin use.     Thank you for allowing pharmacy to be a part of this patient's care.  Excell Seltzer Poteet 02/26/2017 10:49 PM

## 2017-02-26 NOTE — Op Note (Addendum)
[facility] Patient Name: Cheryl Abbott Procedure Date: 02/26/2017 8:06 AM MRN: [id number] Date of Birth: May 15, 1946 Attending MD: Barney Drain MD, MD CSN: [id number] Age: 71 Admit Type: Outpatient Procedure:                Colonoscopy WITH COLD FORCEPS/SNARE CAUTERY                            POLYPECTOMY, SQ INJ, CLIP x1 Indications:              Iron deficiency anemia secondary to chronic blood                            loss. PT ATE [place] YESTERDAY. RX PLAVIX BUT DENIED                            TAKING IT SINCE MAY 2018. Providers:                Barney Drain MD, MD, Lurline Del, RN, Aram Candela Referring MD:             Norwood Levo. Simpson MD, MD Medicines:                Propofol per Anesthesia Complications:            No immediate complications. Estimated Blood Loss:     Estimated blood loss was minimal. Procedure:                Pre-Anesthesia Assessment:                           - Prior to the procedure, a History and Physical                            was performed, and patient medications and                            allergies were reviewed. The patient's tolerance of                            previous anesthesia was also reviewed. The risks                            and benefits of the procedure and the sedation                            options and risks were discussed with the patient.                            All questions were answered, and informed consent                            was obtained. Prior Anticoagulants: The patient has                            taken Plavix (clopidogrel). ASA Grade Assessment:  II - A patient with mild systemic disease. After                            reviewing the risks and benefits, the patient was                            deemed in satisfactory condition to undergo the                            procedure. After obtaining informed consent, the                            colonoscope was  passed under direct vision.                            Throughout the procedure, the patient's blood                            pressure, pulse, and oxygen saturations were                            monitored continuously. The EC-[id number] ([id number])                            scope was introduced through the anus and advanced                            to the the cecum, identified by appendiceal orifice                            and ileocecal valve. The colonoscopy was                            technically difficult and complex due to                            significant looping and RETAINED LIQUID IN COLON.                            Successful completion of the procedure was aided by                            increasing the dose of sedation medication,                            straightening and shortening the scope to obtain                            bowel loop reduction, lavage and COLOWRAP. The                            patient tolerated the procedure fairly well. The  quality of the bowel preparation was good. The                            ileocecal valve, appendiceal orifice, and rectum                            were photographed. Scope In: 8:49:41 AM Scope Out: 10:12:02 AM Scope Withdrawal Time: 1 hour 18 minutes 6 seconds  Total Procedure Duration: 1 hour 22 minutes 21 seconds  Findings:      EIGHTEEN sessile polyps were found in the transverse colon, hepatic       flexure, ascending colon and cecum. The polyps were 4 to 8 mm in size.       These polyps were removed with a hot snare. Resection and retrieval were       complete.      A 3 mm polyp was found in the ascending colon. The polyp was sessile.       The polyp was removed with a cold biopsy forceps. Resection and       retrieval were complete. Coagulation for hemostasis of bleeding caused       by the procedure using snare was successful.      A 15 mm polyp was found in the hepatic  flexure. The polyp was       carpet-like. Area was successfully injected with 4 mL of a 1:10,000       solution of epinephrine for a lift polypectomy. The polyp was removed       with a hot snare. Resection and retrieval were complete. To prevent       bleeding post-intervention, one hemostatic clip was successfully placed       (MR conditional). There was no bleeding at the end of the procedure.      Multiple small and large-mouthed diverticula were found in the       recto-sigmoid colon, sigmoid colon and descending colon.      External and internal hemorrhoids were found during retroflexion. The       hemorrhoids were moderate.      The recto-sigmoid colon and sigmoid colon were moderately redundant. Impression:               - FEDA DUE TO TWENTY COLON POLYPS, REMOVED                           - Diverticulosis in the recto-sigmoid colon, in the                            sigmoid colon and in the descending colon.                           - External and internal hemorrhoids.                           - Redundant LEFT colon. Moderate Sedation:      Per Anesthesia Care Recommendation:           - High fiber diet and low fat diet. NEEDS PLAIN                            FILM  OF ABDOMEN PRIOR TO MRI DUE TO METAL CLIP                            PLACEMENT AT HEPATIC FLEXURE.                           - Continue present medications.                           - Await pathology results.                           - Repeat colonoscopy 1-3 YEARS for surveillance.                           - Return to GI office in 4 months.                           - Patient has a contact number available for                            emergencies. The signs and symptoms of potential                            delayed complications were discussed with the                            patient. Return to normal activities tomorrow.                            Written discharge instructions were provided to the                             patient. Procedure Code(s):        --- Professional ---                           [phone], Colonoscopy, flexible; with removal of                            tumor(s), polyp(s), or other lesion(s) by snare                            technique                           45380, 59, Colonoscopy, flexible; with biopsy,                            single or multiple                           45381, Colonoscopy, flexible; with directed                            submucosal injection(s), any substance Diagnosis Code(s):        ---  Professional ---                           D12.3, Benign neoplasm of transverse colon (hepatic                            flexure or splenic flexure)                           D12.2, Benign neoplasm of ascending colon                           D12.0, Benign neoplasm of cecum                           K64.8, Other hemorrhoids                           D50.0, Iron deficiency anemia secondary to blood                            loss (chronic)                           K57.30, Diverticulosis of large intestine without                            perforation or abscess without bleeding                           Q43.8, Other specified congenital malformations of                            intestine CPT copyright 2016 [employer]. All rights reserved. The codes documented in this report are preliminary and upon coder review may  be revised to meet current compliance requirements. Barney Drain, MD Barney Drain MD, MD 02/26/2017 10:50:26 AM This report has been signed electronically. Number of Addenda: 0

## 2017-02-26 NOTE — Anesthesia Postprocedure Evaluation (Signed)
Anesthesia Post Note Late Entry for 1100  Patient: Cheryl Abbott  Procedure(s) Performed: Procedure(s) (LRB): COLONOSCOPY WITH PROPOFOL (N/A) ESOPHAGOGASTRODUODENOSCOPY (EGD) WITH PROPOFOL (N/A) POLYPECTOMY  Patient location during evaluation: PACU Anesthesia Type: MAC Level of consciousness: awake and alert, oriented and patient cooperative Pain management: pain level controlled Vital Signs Assessment: post-procedure vital signs reviewed and stable Respiratory status: spontaneous breathing and respiratory function stable Cardiovascular status: stable Anesthetic complications: no     Last Vitals:  Vitals:   02/26/17 1100 02/26/17 1104  BP: 122/69 123/71  Pulse: 90 92  Resp: 16 16  Temp:  36.4 C  SpO2:      Last Pain:  Vitals:   02/26/17 1104  TempSrc: Oral  PainSc:                  Shon Mansouri A

## 2017-02-26 NOTE — ED Notes (Signed)
Attempted to obtain urine sample. Pt ambulatory to restroom. Pt unable to void at this time.

## 2017-02-26 NOTE — ED Provider Notes (Signed)
[place] DEPT Provider Note   CSN: [id number] Arrival date & time: 02/26/17  1901     History   Chief Complaint Chief Complaint  Patient presents with  . Shortness of Breath  . Weakness    HPI Cheryl Abbott is a 71 y.o. female.  Level V caveat for confusion.  Status post colonoscopy today. Patient was sent home. She returns via EMS secondary to weakness and fever. No prodromal illnesses. She is normally on oxygen 2 L via nasal cannula.  Patient complains of shortness of breath, but this is not necessarily new. She feels confused.  No chest pain, dysuria, vomiting, diarrhea.      Past Medical History:  Diagnosis Date  . Anemia   . Anxiety   . Anxiety disorder   . Arthritis   . 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 Chronic back pain   . Depression   . Fatty liver   . GERD (gastroesophageal reflux disease)   . Headache   . History of adenomatous polyp of colon    2008  . History of kidney stones   . Hypercalcemia   . Hyperlipidemia   . Hypertension   . Left ureteral stone   . Lipoma of arm 01/19/2015  . Lumbar disc disease with radiculopathy   . Nephrolithiasis   . Neuropathy, peripheral   . OSA treated with BiPAP    per study 2007  . Sciatica   . Sigmoid diverticulosis   . Type 2 diabetes mellitus [facility])     Patient Active Problem List   Diagnosis Date Noted  . 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 Morbid obesity ([place]) 10/14/2016  . Vaginal dryness, menopausal 10/14/2016  . Neuropathic pain of both feet 04/11/2016  . TIA (transient ischemic attack) 02/26/2016  . Lactose intolerance in adult 02/01/2016  . Brain TIA 08/19/2015  . CKD (chronic kidney disease) stage 3, GFR 30-59 ml/min 07/12/2015  . Constipation 05/19/2015  . Lipoma of arm 01/19/2015  . Anemia 10/14/2014  . Lumbar back pain with radiculopathy affecting left lower extremity 05/11/2014  . Nocturnal hypoxia 02/04/2014  . Generalized anxiety disorder 05/20/2013  . Anxiety  and depression 08/13/2012  . Abnormal brain scan 02/07/2012  . Thoracic or lumbosacral neuritis or radiculitis, unspecified 12/22/2010  . Hypercalcemia 11/30/2010  . Colon adenomas 05/11/2009  . HEMORRHOIDS 03/15/2009  . GERD 03/15/2009  . FATTY LIVER DISEASE 03/15/2009  . RENAL CALCULUS 03/15/2009  . Sleep apnea 09/15/2008  . 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 Hyperlipidemia LDL goal <100 09/17/2007  . Hypertension goal BP (blood pressure) < 130/80 09/17/2007    Past Surgical History:  Procedure Laterality Date  . BIOPSY N/A 03/17/2013   Procedure: GASTRIC BIOPSIES;  Surgeon: Danie Binder, MD;  Location: [facility];  Service: Endoscopy;  Laterality: N/A;  . BIOPSY  06/10/2015   Procedure: BIOPSY;  Surgeon: Danie Binder, MD;  Location: [facility];  Service: Endoscopy;;  gastric biopsy  . CARDIAC CATHETERIZATION  05-11-2003  dr Shelva Majestic ([facility])   Abnormal cardiolite/   Normal coronary arteries and normal LVF,  ef  63%  . CARDIOVASCULAR STRESS TEST  01-01-2014   normal lexiscan cardiolite/  no ischemia/ infarct/  normal LV function and wall motion ,  ef 81%  . COLONOSCOPY  last one 10/02/2011   [facility] [place] TICS, IH, NEXT TCS APR 2018  . CYSTO/   RIGHT URETEROSOCOPY LASER LITHOTRIPSY STONE EXTRACTION  10-13-2004  . CYSTO/  RIGHT RETROGRADE  PYELOGRAM/  PLACMENT RIGHT URETERAL STENT  01-10-2010  . CYSTOSCOPY W/ URETERAL STENT PLACEMENT Right 08/19/2015   Procedure: CYSTOSCOPY WITH RETROGRADE PYELOGRAM/URETERAL STENT PLACEMENT;  Surgeon: Franchot Gallo, MD;  Location: [facility];  Service: Urology;  Laterality: Right;  . CYSTOSCOPY WITH RETROGRADE PYELOGRAM, URETEROSCOPY AND STENT PLACEMENT Left 10/21/2014   Procedure: CYSTOSCOPY WITH RETROGRADE PYELOGRAM, URETEROSCOPY AND STENT PLACEMENT;  Surgeon: Franchot Gallo, MD;  Location: [facility];  Service: Urology;  Laterality: Left;  . CYSTOSCOPY WITH RETROGRADE PYELOGRAM, URETEROSCOPY AND  STENT PLACEMENT Right 11/22/2015   Procedure: CYSTOSCOPY, RIGHT URETERAL STENT REMOVAL; RIGHT RETROGRADE PYELOGRAM, RIGHT URETEROSCOPY WITH BALLOON DILATION; RIGHT URETERAL STENT PLACEMENT;  Surgeon: Franchot Gallo, MD;  Location: [facility];  Service: Urology;  Laterality: Right;  . CYSTOSCOPY/RETROGRADE/URETEROSCOPY/STONE EXTRACTION WITH BASKET Bilateral 08/02/2015   Procedure: CYSTOSCOPY; BILATERAL RETROGRADE PYELOGRAMS; BILATERAL URETEROSCOPY, STONE EXTRACTION WITH BASKET;  Surgeon: Franchot Gallo, MD;  Location: [facility];  Service: Urology;  Laterality: Bilateral;  . CYSTOSCOPY/RETROGRADE/URETEROSCOPY/STONE EXTRACTION WITH BASKET Right 06/28/2015   Procedure: CYSTOSCOPY/RETROGRADE/URETEROSCOPY/STONE EXTRACTION WITH BASKET, RIGHT URETERAL DOUBLE J STENT PLACEMENT;  Surgeon: Franchot Gallo, MD;  Location: [facility];  Service: Urology;  Laterality: Right;  . ESOPHAGOGASTRODUODENOSCOPY (EGD) WITH PROPOFOL N/A 03/17/2013   Procedure: ESOPHAGOGASTRODUODENOSCOPY (EGD) WITH PROPOFOL;  Surgeon: Danie Binder, MD;  Location: [facility];  Service: Endoscopy;  Laterality: N/A;  . ESOPHAGOGASTRODUODENOSCOPY (EGD) WITH PROPOFOL N/A 06/10/2015   Distal gastritis, distal esophageal stricture s/p dilation  . FLEXIBLE SIGMOIDOSCOPY  09/11/2011   DJM:EQASTMHD Internal hemorrhoids  . HOLMIUM LASER APPLICATION Left 11/30/2977   Procedure: HOLMIUM LASER APPLICATION;  Surgeon: Franchot Gallo, MD;  Location: [facility];  Service: Urology;  Laterality: Left;  . HOLMIUM LASER APPLICATION Right 8/92/1194   Procedure: HOLMIUM LASER APPLICATION;  Surgeon: Franchot Gallo, MD;  Location: [facility];  Service: Urology;  Laterality: Right;  . MASS EXCISION Left 03/04/2015   Procedure: EXCISION OF SOFT TISSUE NEOPLASM LEFT ARM;  Surgeon: Aviva Signs, MD;  Location: [facility];  Service: General;  Laterality: Left;  . PERCUTANEOUS NEPHROSTOLITHOTOMY Bilateral 12/  2011     [facility]  . POLYPECTOMY N/A 03/17/2013   Procedure:  GASTRIC POLYPECTOMY;  Surgeon: Danie Binder, MD;  Location: [facility];  Service: Endoscopy;  Laterality: N/A;  . REMOVAL RIGHT THIGH CYST  2006  . SAVORY DILATION N/A 03/17/2013   Procedure: SAVORY DILATION;  Surgeon: Danie Binder, MD;  Location: [facility];  Service: Endoscopy;  Laterality: N/A;  #12.8, 14, 15, 16 dilators used  . SAVORY DILATION N/A 06/10/2015   Procedure: SAVORY DILATION;  Surgeon: Danie Binder, MD;  [facility];  Service: Endoscopy;  Laterality: N/A;  . TRANSTHORACIC ECHOCARDIOGRAM  01-01-2014   mild LVH/  ef 17-40%/  grade I diastolic dysfunction/  trivial MR, TR, and PR  . VAGINAL HYSTERECTOMY  1970's    OB History    Gravida Para Term Preterm AB Living   2 2 2     2    SAB TAB Ectopic Multiple Live Births                   Home Medications    Prior to Admission medications   Medication Sig Start Date End Date Taking? Authorizing Provider  ALPRAZolam Duanne Moron) 0.5 MG tablet Take 1 tablet (0.5 mg total) by mouth 2 (two) times daily. 10/11/16  Yes [name], MD  aspirin EC 325 MG tablet Take 325 mg by mouth daily.   Yes [provider]  Cholecalciferol (VITAMIN D3) 5000 units CAPS Take 1 capsule by mouth 2 (two) times daily.   Yes [provider]  Cyanocobalamin (B-12 PO) Take 1 tablet by mouth daily.   Yes [provider]  diphenhydramine-acetaminophen (TYLENOL PM) 25-500 MG TABS tablet Take 2 tablets by mouth at bedtime.   Yes [provider]  docusate sodium (COLACE) 100 MG capsule Take 200 mg by mouth at bedtime.    Yes [provider]  iron polysaccharides (NIFEREX) 150 MG capsule Take 150 mg by mouth daily.   Yes [provider]  lovastatin (MEVACOR) 40 MG tablet TAKE 2 TABLETS (80 MG TOTAL) BY MOUTH AT BEDTIME. 01/14/17  Yes [name], MD  OXYGEN Inhale into the lungs daily.   Yes [provider]  pantoprazole (PROTONIX) 40 MG tablet TAKE 1 TABLET (40 MG TOTAL) BY MOUTH 2  (TWO) TIMES DAILY. 09/03/16  Yes [name], MD  PARoxetine (PAXIL) 40 MG tablet TAKE 1 TABLET BY MOUTH EVERY MORNING 12/07/16  Yes [name], MD  TRADJENTA 5 MG TABS tablet TAKE 1 TABLET BY MOUTH EVERY DAY 02/13/17  Yes [name], MD  triamcinolone cream (KENALOG) 0.1 % Apply 1 application topically 3 (three) times daily. 12/11/16  Yes Triplett, Tammy, PA-C  amLODipine (NORVASC) 10 MG tablet TAKE 1 TABLET (10 MG TOTAL) BY MOUTH DAILY. Patient not taking: Reported on 02/26/2017 12/24/16   [name], MD    Family History Family History  Problem Relation Age of Onset  . Hypertension Mother   . Diabetes Mother   . Heart failure Mother   . Dementia Mother   . Emphysema Father   . Hypertension Father   . Diabetes Brother   . GER disease Brother   . Hypertension Sister   . Hypertension Sister   . Cancer Unknown        family history   . Diabetes Unknown        family history   . Heart defect Unknown        famiily history   . Arthritis Unknown        family history   . Anesthesia problems Neg Hx   . Hypotension Neg Hx   . Malignant hyperthermia Neg Hx   . Pseudochol deficiency Neg Hx   . Colon cancer Neg Hx     Social History Social History  Substance Use Topics  . Smoking status: Former Smoker    Packs/day: 1.00    Years: 20.00    Types: Cigarettes    Start date: 09/21/1974    Quit date: 09/21/1994  . Smokeless tobacco: Never Used     Comment: Quit x 20 years  . Alcohol use No     Allergies   Ace inhibitors; Keflex [cephalexin]; Nitrofurantoin; and Penicillins   Review of Systems Review of Systems  Unable to perform ROS: Acuity of condition     Physical Exam Updated Vital Signs BP 113/68   Pulse 100   Temp 98.4 F (36.9 C) (Oral)   Resp (!) 23   Ht 5' 5.5" (1.664 m)   Wt 93.4 kg (206 lb)   SpO2 91%   BMI 33.76 kg/m   Physical Exam  Constitutional:  Slightly confused  HENT:  Head: Normocephalic and atraumatic.    Eyes: Conjunctivae are normal.  Neck: Neck supple.  Cardiovascular: Normal rate and regular rhythm.   Pulmonary/Chest: Effort normal and breath sounds normal.  Abdominal: Soft. Bowel sounds  are normal.  Musculoskeletal: Normal range of motion.  Neurological: She is alert.  Skin: Skin is warm and dry.  Psychiatric:  Flat affect  Nursing note and vitals reviewed.    ED Treatments / Results  Labs (all labs ordered are listed, but only abnormal results are displayed) Labs Reviewed  CBC WITH DIFFERENTIAL/PLATELET - Abnormal; Notable for the following:       Result Value   WBC 16.3 (*)    RBC 3.79 (*)    Hemoglobin 10.6 (*)    HCT 32.5 (*)    RDW 17.9 (*)    Neutro Abs 14.3 (*)    All other components within normal limits  COMPREHENSIVE METABOLIC PANEL - Abnormal; Notable for the following:    Glucose, Bld 154 (*)    Creatinine, Ser 1.58 (*)    Total Protein 8.4 (*)    GFR calc non Af Amer 32 (*)    GFR calc Af Amer 37 (*)    All other components within normal limits  URINALYSIS, ROUTINE W REFLEX MICROSCOPIC    EKG  EKG Interpretation None       Radiology Dg Chest 2 View  Result Date: 02/26/2017 CLINICAL DATA:  Weakness, shortness of breath EXAM: CHEST  2 VIEW COMPARISON:  03/07/2016 FINDINGS: Lower lung volumes with increased bibasilar atelectasis. Normal heart size with mild vascular congestion centrally. No large effusion or pneumothorax. Trachea is midline. Degenerative changes of the spine. IMPRESSION: Lower lung volumes with bibasilar atelectasis. Electronically Signed   By: Jerilynn Mages.  Shick M.D.   On: 02/26/2017 20:05    Procedures Procedures (including critical care time)  Medications Ordered in ED Medications  sodium chloride 0.9 % bolus 1,000 mL (1,000 mLs Intravenous New Bag/Given 02/26/17 1933)  acetaminophen (TYLENOL) tablet 1,000 mg (1,000 mg Oral Given 02/26/17 1940)     Initial Impression / Assessment and Plan / ED Course  I have reviewed the triage  vital signs and the nursing notes.  Pertinent labs & imaging results that were available during my care of the patient were reviewed by me and considered in my medical decision making (see chart for details).     Status post colonoscopy today. Patient was sent home and returned via EMS. She is febrile and slightly confused. She has improved with IV fluids and time. However, I do not feel comfortable sending her home. Will admit to general medicine. Urinalysis pending.  Final Clinical Impressions(s) / ED Diagnoses   Final diagnoses:  Weakness  Fever, unspecified fever cause    New Prescriptions New Prescriptions   No medications on file     Nat Christen, MD 02/26/17 2207

## 2017-02-26 NOTE — Transfer of Care (Signed)
Immediate Anesthesia Transfer of Care Note  Patient: Cheryl Abbott  Procedure(s) Performed: Procedure(s) with comments: COLONOSCOPY WITH PROPOFOL (N/A) - 11:00am ESOPHAGOGASTRODUODENOSCOPY (EGD) WITH PROPOFOL (N/A) POLYPECTOMY - polyp at cecum, ascending colon polyps x3, hepatic flexure polyps x8, transverse colon polyps x8   Patient Location: PACU  Anesthesia Type:MAC  Level of Consciousness: awake  Airway & Oxygen Therapy: Patient Spontanous Breathing and Patient connected to nasal cannula oxygen  Post-op Assessment: Report given to RN  Post vital signs: Reviewed  Last Vitals:  Vitals:   02/26/17 0755 02/26/17 0800  BP: 118/74   Pulse:    Resp: 18 (!) 23  Temp:    SpO2: 95% 93%    Last Pain:  Vitals:   02/26/17 0650  PainSc: 0-No pain         Complications: No apparent anesthesia complications

## 2017-02-26 NOTE — ED Triage Notes (Signed)
Patient had EGD & Colonoscopy today, ems called out for weakness, patient was short of breath, suppose to be on home O2, not using it, has some confusion.

## 2017-02-26 NOTE — Op Note (Signed)
[facility] Patient Name: Cheryl Abbott Procedure Date: 02/26/2017 10:14 AM MRN: [id number] Date of Birth: 12-Oct-1945 Attending MD: Barney Drain MD, MD CSN: [id number] Age: 71 Admit Type: Outpatient Procedure:                Upper GI endoscopy, diagnostic Indications:              Iron deficiency anemia secondary to chronic blood                            loss Providers:                Barney Drain MD, MD, Rosina Lowenstein, RN, Randa Spike, Technician Referring MD:             Norwood Levo. Simpson MD, MD Medicines:                Propofol per Anesthesia Complications:            No immediate complications. Estimated Blood Loss:     Estimated blood loss: none. Procedure:                Pre-Anesthesia Assessment:                           - Prior to the procedure, a History and Physical                            was performed, and patient medications and                            allergies were reviewed. The patient's tolerance of                            previous anesthesia was also reviewed. The risks                            and benefits of the procedure and the sedation                            options and risks were discussed with the patient.                            All questions were answered, and informed consent                            was obtained. Prior Anticoagulants: The patient has                            taken Plavix (clopidogrel). ASA Grade Assessment:                            II - A patient with mild systemic disease. After  reviewing the risks and benefits, the patient was                            deemed in satisfactory condition to undergo the                            procedure.                           After obtaining informed consent, the endoscope was                            passed under direct vision. Throughout the                            procedure, the patient's blood  pressure, pulse, and                            oxygen saturations were monitored continuously. The                            [id number]) scope was introduced through the                            mouth, and advanced to the second part of duodenum.                            The upper GI endoscopy was accomplished without                            difficulty. The patient tolerated the procedure                            well. Scope In: 10:18:49 AM Scope Out: 10:22:23 AM Total Procedure Duration: 0 hours 3 minutes 34 seconds  Findings:      The examined esophagus was normal.      A small hiatal hernia was present.      Patchy mild inflammation characterized by congestion (edema) and       erythema was found in the gastric antrum.      The examined duodenum was normal. Impression:               - Small hiatal hernia.                           - MILD Gastritis. Moderate Sedation:      Per Anesthesia Care Recommendation:           - High fiber diet and low fat diet.                           - Continue present medications.                           - Return to my office in 4 months.                           -  Patient has a contact number available for                            emergencies. The signs and symptoms of potential                            delayed complications were discussed with the                            patient. Return to normal activities tomorrow.                            Written discharge instructions were provided to the                            patient. Procedure Code(s):        --- Professional ---                           [phone], Esophagogastroduodenoscopy, flexible,                            transoral; diagnostic, including collection of                            specimen(s) by brushing or washing, when performed                            (separate procedure) Diagnosis Code(s):        --- Professional ---                           K44.9,  Diaphragmatic hernia without obstruction or                            gangrene                           K29.70, Gastritis, unspecified, without bleeding                           D50.0, Iron deficiency anemia secondary to blood                            loss (chronic) CPT copyright 2016 [employer]. All rights reserved. The codes documented in this report are preliminary and upon coder review may  be revised to meet current compliance requirements. Barney Drain, MD Barney Drain MD, MD 02/26/2017 10:53:17 AM This report has been signed electronically. Number of Addenda: 0

## 2017-02-27 ENCOUNTER — Encounter (HOSPITAL_COMMUNITY): Payer: Self-pay | Admitting: *Deleted

## 2017-02-27 DIAGNOSIS — R6510 Systemic inflammatory response syndrome (SIRS) of non-infectious origin without acute organ dysfunction: Secondary | ICD-10-CM

## 2017-02-27 LAB — GLUCOSE, CAPILLARY
Glucose-Capillary: 102 mg/dL — ABNORMAL HIGH (ref 65–99)
Glucose-Capillary: 155 mg/dL — ABNORMAL HIGH (ref 65–99)
Glucose-Capillary: 79 mg/dL (ref 65–99)
Glucose-Capillary: 103 mg/dL — ABNORMAL HIGH (ref 65–99)

## 2017-02-27 LAB — BASIC METABOLIC PANEL
Anion gap: 6 (ref 5–15)
BUN: 12 mg/dL (ref 6–20)
CO2: 26 mmol/L (ref 22–32)
Calcium: 9.2 mg/dL (ref 8.9–10.3)
Chloride: 107 mmol/L (ref 101–111)
Creatinine, Ser: 1.42 mg/dL — ABNORMAL HIGH (ref 0.44–1.00)
GFR calc Af Amer: 42 mL/min — ABNORMAL LOW (ref >60)
GFR calc non Af Amer: 36 mL/min — ABNORMAL LOW (ref >60)
Glucose, Bld: 97 mg/dL (ref 65–99)
Potassium: 3.7 mmol/L (ref 3.5–5.1)
Sodium: 139 mmol/L (ref 135–145)

## 2017-02-27 LAB — CBC
HCT: 30.9 % — ABNORMAL LOW (ref 36.0–46.0)
Hemoglobin: 9.7 g/dL — ABNORMAL LOW (ref 12.0–15.0)
MCH: 27.2 pg (ref 26.0–34.0)
MCHC: 31.4 g/dL (ref 30.0–36.0)
MCV: 86.8 fL (ref 78.0–100.0)
Platelets: 281 10*3/uL (ref 150–400)
RBC: 3.56 MIL/uL — ABNORMAL LOW (ref 3.87–5.11)
RDW: 18.1 % — ABNORMAL HIGH (ref 11.5–15.5)
WBC: 17.5 10*3/uL — ABNORMAL HIGH (ref 4.0–10.5)

## 2017-02-27 LAB — I-STAT CG4 LACTIC ACID, ED: Lactic Acid, Venous: 1.27 mmol/L (ref 0.5–1.9)

## 2017-02-27 MED ORDER — VANCOMYCIN HCL 10 G IV SOLR
1250.0000 mg | INTRAVENOUS | Status: DC
  Administered 2017-02-27: 1250 mg via INTRAVENOUS
  Filled 2017-02-27 (×2): qty 1250

## 2017-02-27 MED ORDER — INSULIN ASPART 100 UNIT/ML ~~LOC~~ SOLN
0.0000 [IU] | Freq: Three times a day (TID) | SUBCUTANEOUS | Status: DC
  Administered 2017-02-27: 3 [IU] via SUBCUTANEOUS
  Administered 2017-02-28: 2 [IU] via SUBCUTANEOUS

## 2017-02-27 MED ORDER — DEXTROSE 5 % IV SOLN
INTRAVENOUS | Status: AC
  Filled 2017-02-27: qty 1

## 2017-02-27 MED ORDER — LEVOFLOXACIN IN D5W 750 MG/150ML IV SOLN: 750.0000 mg | INTRAVENOUS | Status: DC

## 2017-02-27 MED ORDER — ALPRAZOLAM 0.5 MG PO TABS
0.5000 mg | ORAL_TABLET | Freq: Once | ORAL | Status: AC
  Administered 2017-02-27: 0.5 mg via ORAL
  Filled 2017-02-27: qty 1

## 2017-02-27 MED ORDER — AZTREONAM 1 G IJ SOLR
1.0000 g | Freq: Three times a day (TID) | INTRAMUSCULAR | Status: DC
  Administered 2017-02-27 – 2017-02-28 (×5): 1 g via INTRAVENOUS
  Filled 2017-02-27 (×8): qty 1

## 2017-02-27 MED ORDER — SODIUM CHLORIDE 0.9 % IV SOLN
INTRAVENOUS | Status: DC
  Administered 2017-02-28: 01:00:00 via INTRAVENOUS

## 2017-02-27 NOTE — Progress Notes (Signed)
Patient admitted to the hospital earlier this morning by Dr. Aggie Moats.  Patient seen and examined. She feels generally weak. Denies any cough. No vomiting or diarrhea.  She's been admitted to the hospital after noting to be febrile and had an elevated WBC count. She had EGD and colonoscopy done yesterday which was reportedly uneventful. Abdomen is benign on exam. She does not have any clear focus of infection. Cultures have been sent and she was started on empiric antibiotics. Will check influenza panel. If she remains afebrile over the next 24 hours, anticipate discharge home tomorrow.  Gerrit Rafalski

## 2017-02-27 NOTE — H&P (Signed)
[facility] History and Physical  Cheryl Abbott JAS:[id number] DOB: May 07, 1946 DOA: 02/26/2017  Referring physician:  PCP: [name], MD   Chief Complaint: Weakness  HPI: Cheryl Abbott is a 71 y.o. female  with past mental history significant for anemia, anxiety, reflux, high cholesterol presents emergency room with weakness. Patient had an EGD colonoscopy today. Was uneventful except sore throat. Went home and stayed alone AGAINST MEDICAL ADVICE. Patient was told to have someone with her for 24 hours. Patient began to get very weak and slid to the floor from her couch. Patient was found without her oxygen on. She supposed to wear 2 L chronically.  ED course: Sepsis protocol. Hospitalist consulted for admission.   Review of Systems:  As per HPI otherwise 10 point review of systems negative.    Past Medical History:  Diagnosis Date  . Anemia   . Anxiety   . Anxiety disorder   . Arthritis   . 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 Chronic back pain   . Depression   . Fatty liver   . GERD (gastroesophageal reflux disease)   . Headache   . History of adenomatous polyp of colon    2008  . History of kidney stones   . Hypercalcemia   . Hyperlipidemia   . Hypertension   . Left ureteral stone   . Lipoma of arm 01/19/2015  . Lumbar disc disease with radiculopathy   . Nephrolithiasis   . Neuropathy, peripheral   . OSA treated with BiPAP    per study 2007  . Sciatica   . Sigmoid diverticulosis   . Type 2 diabetes mellitus ([place])    Past Surgical History:  Procedure Laterality Date  . BIOPSY N/A 03/17/2013   Procedure: GASTRIC BIOPSIES;  Surgeon: Danie Binder, MD;  Location: [facility];  Service: Endoscopy;  Laterality: N/A;  . BIOPSY  06/10/2015   Procedure: BIOPSY;  Surgeon: Danie Binder, MD;  [facility];  Service: Endoscopy;;  gastric biopsy  . CARDIAC CATHETERIZATION  05-11-2003  dr Shelva Majestic ([facility])   Abnormal  cardiolite/   Normal coronary arteries and normal LVF,  ef  63%  . CARDIOVASCULAR STRESS TEST  01-01-2014   normal lexiscan cardiolite/  no ischemia/ infarct/  normal LV function and wall motion ,  ef 81%  . COLONOSCOPY  last one 10/02/2011   [facility] [place] [facility], IH, NEXT TCS APR 2018  . CYSTO/   RIGHT URETEROSOCOPY LASER LITHOTRIPSY STONE EXTRACTION  10-13-2004  . CYSTO/  RIGHT RETROGRADE PYELOGRAM/  PLACMENT RIGHT URETERAL STENT  01-10-2010  . CYSTOSCOPY W/ URETERAL STENT PLACEMENT Right 08/19/2015   Procedure: CYSTOSCOPY WITH RETROGRADE PYELOGRAM/URETERAL STENT PLACEMENT;  Surgeon: Franchot Gallo, MD;  Location: [facility];  Service: Urology;  Laterality: Right;  . CYSTOSCOPY WITH RETROGRADE PYELOGRAM, URETEROSCOPY AND STENT PLACEMENT Left 10/21/2014   Procedure: CYSTOSCOPY WITH RETROGRADE PYELOGRAM, URETEROSCOPY AND STENT PLACEMENT;  Surgeon: Franchot Gallo, MD;  Location: [facility];  Service: Urology;  Laterality: Left;  . CYSTOSCOPY WITH RETROGRADE PYELOGRAM, URETEROSCOPY AND STENT PLACEMENT Right 11/22/2015   Procedure: CYSTOSCOPY, RIGHT URETERAL STENT REMOVAL; RIGHT RETROGRADE PYELOGRAM, RIGHT URETEROSCOPY WITH BALLOON DILATION; RIGHT URETERAL STENT PLACEMENT;  Surgeon: Franchot Gallo, MD;  Location: [facility];  Service: Urology;  Laterality: Right;  . CYSTOSCOPY/RETROGRADE/URETEROSCOPY/STONE EXTRACTION WITH BASKET Bilateral 08/02/2015   Procedure: CYSTOSCOPY; BILATERAL RETROGRADE PYELOGRAMS; BILATERAL URETEROSCOPY, STONE EXTRACTION WITH BASKET;  Surgeon: Franchot Gallo, MD;  [facility];  Service: Urology;  Laterality: Bilateral;  . CYSTOSCOPY/RETROGRADE/URETEROSCOPY/STONE EXTRACTION WITH BASKET Right 06/28/2015   Procedure: CYSTOSCOPY/RETROGRADE/URETEROSCOPY/STONE EXTRACTION WITH BASKET, RIGHT URETERAL DOUBLE J STENT PLACEMENT;  Surgeon: Franchot Gallo, MD;  Location[facility];  Service: Urology;  Laterality: Right;  . ESOPHAGOGASTRODUODENOSCOPY (EGD) WITH PROPOFOL N/A  03/17/2013   Procedure: ESOPHAGOGASTRODUODENOSCOPY (EGD) WITH PROPOFOL;  Surgeon: Danie Binder, MD;  [facility];  Service: Endoscopy;  Laterality: N/A;  . ESOPHAGOGASTRODUODENOSCOPY (EGD) WITH PROPOFOL N/A 06/10/2015   Distal gastritis, distal esophageal stricture s/p dilation  . FLEXIBLE SIGMOIDOSCOPY  09/11/2011   XAJ:OINOMVEH Internal hemorrhoids  . HOLMIUM LASER APPLICATION Left 07/21/4707   Procedure: HOLMIUM LASER APPLICATION;  Surgeon: Franchot Gallo, MD;  [facility];  Service: Urology;  Laterality: Left;  . HOLMIUM LASER APPLICATION Right 12/06/3660   Procedure: HOLMIUM LASER APPLICATION;  Surgeon: Franchot Gallo, MD;  [facility];  Service: Urology;  Laterality: Right;  . MASS EXCISION Left 03/04/2015   Procedure: EXCISION OF SOFT TISSUE NEOPLASM LEFT ARM;  Surgeon: Aviva Signs, MD;  Location: [facility];  Service: General;  Laterality: Left;  . PERCUTANEOUS NEPHROSTOLITHOTOMY Bilateral 12/  2011     [facility]  . POLYPECTOMY N/A 03/17/2013   Procedure: GASTRIC POLYPECTOMY;  Surgeon: Danie Binder, MD;  Location: [facility];  Service: Endoscopy;  Laterality: N/A;  . REMOVAL RIGHT THIGH CYST  2006  . SAVORY DILATION N/A 03/17/2013   Procedure: SAVORY DILATION;  Surgeon: Danie Binder, MD;  Location: [facility];  Service: Endoscopy;  Laterality: N/A;  #12.8, 14, 15, 16 dilators used  . SAVORY DILATION N/A 06/10/2015   Procedure: SAVORY DILATION;  Surgeon: Danie Binder, MD;  [facility];  Service: Endoscopy;  Laterality: N/A;  . TRANSTHORACIC ECHOCARDIOGRAM  01-01-2014   mild LVH/  ef 94-76%/  grade I diastolic dysfunction/  trivial MR, TR, and PR  . VAGINAL HYSTERECTOMY  1970's   Social History:  reports that she quit smoking about 22 years ago. Her smoking use included Cigarettes. She started smoking about 42 years ago. She has a 20.00 pack-year smoking history. She has never used smokeless tobacco. She reports that she does not drink alcohol or  use drugs.  Allergies  Allergen Reactions  . Ace Inhibitors Cough  . Keflex [Cephalexin] Diarrhea and Nausea And Vomiting  . Nitrofurantoin Diarrhea and Nausea And Vomiting  . Penicillins Hives and Other (See Comments)    Reaction:  Blisters on hands/feet  Has patient had a PCN reaction causing immediate rash, facial/tongue/throat swelling, SOB or lightheadedness with hypotension: No Has patient had a PCN reaction causing severe rash involving mucus membranes or skin necrosis: No Has patient had a PCN reaction that required hospitalization No Has patient had a PCN reaction occurring within the last 10 years: No If all of the above answers are "NO", then may proceed with Cephalosporin use.    Family History  Problem Relation Age of Onset  . Hypertension Mother   . Diabetes Mother   . Heart failure Mother   . Dementia Mother   . Emphysema Father   . Hypertension Father   . Diabetes Brother   . GER disease Brother   . Hypertension Sister   . Hypertension Sister   . Cancer Unknown        family history   . Diabetes Unknown        family history   . Heart defect Unknown        famiily history   . Arthritis  Unknown        family history   . Anesthesia problems Neg Hx   . Hypotension Neg Hx   . Malignant hyperthermia Neg Hx   . Pseudochol deficiency Neg Hx   . Colon cancer Neg Hx      Prior to Admission medications   Medication Sig Start Date End Date Taking? Authorizing Provider  ALPRAZolam Duanne Moron) 0.5 MG tablet Take 1 tablet (0.5 mg total) by mouth 2 (two) times daily. 10/11/16  Yes [name], MD  aspirin EC 325 MG tablet Take 325 mg by mouth daily.   Yes [provider]  Cholecalciferol (VITAMIN D3) 5000 units CAPS Take 1 capsule by mouth 2 (two) times daily.   Yes [provider]  Cyanocobalamin (B-12 PO) Take 1 tablet by mouth daily.   Yes [provider]  diphenhydramine-acetaminophen (TYLENOL PM) 25-500 MG TABS tablet Take 2 tablets  by mouth at bedtime.   Yes [provider]  docusate sodium (COLACE) 100 MG capsule Take 200 mg by mouth at bedtime.    Yes [provider]  iron polysaccharides (NIFEREX) 150 MG capsule Take 150 mg by mouth daily.   Yes [provider]  lovastatin (MEVACOR) 40 MG tablet TAKE 2 TABLETS (80 MG TOTAL) BY MOUTH AT BEDTIME. 01/14/17  Yes [name], MD  OXYGEN Inhale into the lungs daily.   Yes [provider]  pantoprazole (PROTONIX) 40 MG tablet TAKE 1 TABLET (40 MG TOTAL) BY MOUTH 2 (TWO) TIMES DAILY. 09/03/16  Yes [name], MD  PARoxetine (PAXIL) 40 MG tablet TAKE 1 TABLET BY MOUTH EVERY MORNING 12/07/16  Yes [name], MD  TRADJENTA 5 MG TABS tablet TAKE 1 TABLET BY MOUTH EVERY DAY 02/13/17  Yes [name], MD  triamcinolone cream (KENALOG) 0.1 % Apply 1 application topically 3 (three) times daily. 12/11/16  Yes Triplett, Tammy, PA-C  amLODipine (NORVASC) 10 MG tablet TAKE 1 TABLET (10 MG TOTAL) BY MOUTH DAILY. Patient not taking: Reported on 02/26/2017 12/24/16   [name], MD   Physical Exam: Vitals:   02/27/17 0000 02/27/17 0030 02/27/17 0100 02/27/17 0233  BP: (!) 98/54 109/67 125/64 (!) 105/39  Pulse: 79 79 84 73  Resp: (!) 22 (!) 21 (!) 22 (!) 22  Temp:    97.6 F (36.4 C)  TempSrc:    Oral  SpO2: 92% 95% 97% 99%  Weight:    101 kg (222 lb 10.6 oz)  Height:        Wt Readings from Last 3 Encounters:  02/27/17 101 kg (222 lb 10.6 oz)  02/15/17 93.6 kg (206 lb 6.4 oz)  02/12/17 92.5 kg (204 lb)    General:  Appears calm and comfortable; A&Ox3 Eyes:  PERRL, EOMI, normal lids, iris ENT:  grossly normal hearing, lips & tongue Neck:  no LAD, masses or thyromegaly Cardiovascular:  RRR, no m/r/g. No LE edema.  Respiratory:  CTA bilaterally, no w/r/r. Normal respiratory effort. Abdomen:  soft, ntnd Skin:  no rash or induration seen on limited exam Musculoskeletal:  grossly normal tone  BUE/BLE Psychiatric:  grossly normal mood and affect, speech fluent and appropriate Neurologic:  CN 2-12 grossly intact, moves all extremities in coordinated fashion.          Labs on Admission:  Basic Metabolic Panel:  Recent Labs Lab 02/26/17 1928  NA 136  K 3.6  CL 103  CO2 25  GLUCOSE 154*  BUN 13  CREATININE  1.58*  CALCIUM 9.5   Liver Function Tests:  Recent Labs Lab 02/26/17 1928  AST 29  ALT 26  ALKPHOS 93  BILITOT 0.3  PROT 8.4*  ALBUMIN 3.7   No results for input(s): LIPASE, AMYLASE in the last 168 hours. No results for input(s): AMMONIA in the last 168 hours. CBC:  Recent Labs Lab 02/26/17 1928  WBC 16.3*  NEUTROABS 14.3*  HGB 10.6*  HCT 32.5*  MCV 85.8  PLT 326   Cardiac Enzymes: No results for input(s): CKTOTAL, CKMB, CKMBINDEX, TROPONINI in the last 168 hours.  BNP (last 3 results) No results for input(s): BNP in the last 8760 hours.  ProBNP (last 3 results) No results for input(s): PROBNP in the last 8760 hours.   Serum creatinine: 1.58 mg/dL (H) 02/26/17 1928 Estimated creatinine clearance: 38.3 mL/min (A)  CBG:  Recent Labs Lab 02/26/17 0737 02/26/17 1041  GLUCAP 123* 145*    Radiological Exams on Admission: Dg Chest 2 View  Result Date: 02/26/2017 CLINICAL DATA:  Weakness, shortness of breath EXAM: CHEST  2 VIEW COMPARISON:  03/07/2016 FINDINGS: Lower lung volumes with increased bibasilar atelectasis. Normal heart size with mild vascular congestion centrally. No large effusion or pneumothorax. Trachea is midline. Degenerative changes of the spine. IMPRESSION: Lower lung volumes with bibasilar atelectasis. Electronically Signed   By: Jerilynn Mages.  Shick M.D.   On: 02/26/2017 20:05    EKG: Independently reviewed. Sinus tach. No stemi.  Assessment/Plan Active Problems:   SIRS (systemic inflammatory response syndrome) (HCC)   SIRS 2/2 unk after GI procedure Patient hemodynamically stable Given vanc, aztreonam, levaquin emergency  room, will continue Urine culture pending Blood cultures 2 pending Lactic acid normal Lipase normal  Sleepy Appears mildly sedated on my exam Will monitor Cont pulse ox  CKD Monitor Cr daily Cr at baseline,  1.5-1.6  DM SSI AC Hold tradjnta  Mood Cont paxil Prn Xanax No SI/HI  GERD PPI  Code Status: FULL DVT Prophylaxis: SCDs Family Communication: none at bedside Disposition Plan: Pending Improvement  Status: tele obs  Elwin Mocha, MD Family Medicine [facility] [email or web address] Password [email or web address]

## 2017-02-27 NOTE — Evaluation (Signed)
Physical Therapy Evaluation Patient Details Name: Cheryl Abbott MRN: [id number] DOB: Oct 18, 1945 Today's Date: 02/27/2017   History of Present Illness  Cheryl Abbott is a 71 y.o. female  with past mental history significant for anemia, anxiety, reflux, high cholesterol presents emergency room with weakness. Patient had an EGD colonoscopy today. Was uneventful except sore throat. Went home and stayed alone AGAINST MEDICAL ADVICE. Patient was told to have someone with her for 24 hours. Patient began to get very weak and slid to the floor from her couch. Patient was found without her oxygen on. She supposed to wear 2 L chronically.    Clinical Impression  Patient limited for functional mobility as stated below secondary to BLE weakness, fatigue and poor standing balance.  Patient will benefit from continued physical therapy in hospital and recommended venue below to increase strength, balance, endurance for safe ADLs and gait.    Follow Up Recommendations Home health PT;Supervision - Intermittent    Equipment Recommendations       Recommendations for Other Services       Precautions / Restrictions Precautions Precautions: Fall Restrictions Weight Bearing Restrictions: No      Mobility  Bed Mobility Overal bed mobility: Needs Assistance Bed Mobility: Supine to Sit;Sit to Supine     Supine to sit: Min guard Sit to supine: Min guard      Transfers Overall transfer level: Needs assistance Equipment used: Rolling walker (2 wheeled) Transfers: Sit to/from Stand Sit to Stand: Min guard            Ambulation/Gait Ambulation/Gait assistance: Supervision Ambulation Distance (Feet): 45 Feet Assistive device: Rolling walker (2 wheeled) Gait Pattern/deviations: Decreased step length - right;Decreased step length - left;Decreased stride length   Gait velocity interpretation: Below normal speed for age/gender General Gait Details: demonstrates slightly labored slow cadence  without loss of balance, on 2 LPM O2  Stairs            Wheelchair Mobility    Modified Rankin (Stroke Patients Only)       Balance Overall balance assessment: Needs assistance Sitting-balance support: No upper extremity supported;Feet supported Sitting balance-Leahy Scale: Good     Standing balance support: Bilateral upper extremity supported;During functional activity Standing balance-Leahy Scale: Fair                               Pertinent Vitals/Pain Pain Assessment: No/denies pain    Home Living Family/patient expects to be discharged to:: Private residence Living Arrangements: Alone Available Help at Discharge: Family Type of Home: House Home Access: Stairs to enter Entrance Stairs-Rails: Right Entrance Stairs-Number of Steps: 5 steps with bilateral siderails to wide to reach both Home Layout: One level Home Equipment: Walker - 2 wheels;Cane - single point;Bedside commode;Shower seat      Prior Function Level of Independence: Independent with assistive device(s)         Comments: Usually walks without using assitive device most of the time, uses SPC for community distances     Hand Dominance   Dominant Hand: Right    Extremity/Trunk Assessment   Upper Extremity Assessment Upper Extremity Assessment: Generalized weakness    Lower Extremity Assessment Lower Extremity Assessment: Generalized weakness    Cervical / Trunk Assessment Cervical / Trunk Assessment: Normal  Communication   Communication: No difficulties  Cognition Arousal/Alertness: Awake/alert Behavior During Therapy: WFL for tasks assessed/performed Overall Cognitive Status: Within Functional Limits for tasks assessed  General Comments      Exercises     Assessment/Plan    PT Assessment Patient needs continued PT services  PT Problem List Decreased strength;Decreased activity tolerance;Decreased  balance;Decreased mobility       PT Treatment Interventions Gait training;Stair training;Functional mobility training;Therapeutic activities;Therapeutic exercise;Patient/family education    PT Goals (Current goals can be found in the Care Plan section)  Acute Rehab PT Goals Patient Stated Goal: return home with family to assist PT Goal Formulation: With patient Time For Goal Achievement: 03/01/17 Potential to Achieve Goals: Good    Frequency Min 3X/week   Barriers to discharge        Co-evaluation               AM-PAC PT "6 Clicks" Daily Activity  Outcome Measure Difficulty turning over in bed (including adjusting bedclothes, sheets and blankets)?: Unable Difficulty moving from lying on back to sitting on the side of the bed? : Unable Difficulty sitting down on and standing up from a chair with arms (e.g., wheelchair, bedside commode, etc,.)?: Unable Help needed moving to and from a bed to chair (including a wheelchair)?: A Little Help needed walking in hospital room?: A Little Help needed climbing 3-5 steps with a railing? : A Little 6 Click Score: 12    End of Session Equipment Utilized During Treatment: Gait belt;Oxygen   Patient left: in chair;with call bell/phone within reach;with family/visitor present   PT Visit Diagnosis: Unsteadiness on feet (R26.81);Other abnormalities of gait and mobility (R26.89);Muscle weakness (generalized) (M62.81)    Time: 6314-9702 PT Time Calculation (min) (ACUTE ONLY): 28 min   Charges:   PT Evaluation $PT Eval Low Complexity: 1 Low PT Treatments $Therapeutic Activity: 23-37 mins   PT G Codes:   PT G-Codes **NOT FOR INPATIENT CLASS** Functional Assessment Tool Used: AM-PAC 6 Clicks Basic Mobility Functional Limitation: Mobility: Walking and moving around Mobility: Walking and Moving Around Current Status (O3785): At least 60 percent but less than 80 percent impaired, limited or restricted Mobility: Walking and Moving Around  Goal Status [id number]): At least 60 percent but less than 80 percent impaired, limited or restricted Mobility: Walking and Moving Around Discharge Status [phone]): At least 60 percent but less than 80 percent impaired, limited or restricted    1:33 PM, 02/27/17 Lonell Grandchild, MPT Physical Therapist with [facility] 336 [phone] office [phone] mobile phone

## 2017-02-27 NOTE — Care Management Obs Status (Signed)
[facility] NOTIFICATION   Patient Details  Name: Cheryl Abbott MRN: [id number] Date of Birth: 01-07-1946   Medicare Observation Status Notification Given:  Yes    Sherald Barge, RN 02/27/2017, 3:47 PM

## 2017-02-28 ENCOUNTER — Telehealth: Payer: Self-pay | Admitting: Gastroenterology

## 2017-02-28 ENCOUNTER — Encounter: Payer: Self-pay | Admitting: Family Medicine

## 2017-02-28 DIAGNOSIS — R350 Frequency of micturition: Secondary | ICD-10-CM | POA: Insufficient documentation

## 2017-02-28 DIAGNOSIS — N183 Chronic kidney disease, stage 3 (moderate): Secondary | ICD-10-CM | POA: Diagnosis not present

## 2017-02-28 DIAGNOSIS — R6510 Systemic inflammatory response syndrome (SIRS) of non-infectious origin without acute organ dysfunction: Secondary | ICD-10-CM | POA: Diagnosis not present

## 2017-02-28 DIAGNOSIS — E1121 Type 2 diabetes mellitus with diabetic nephropathy: Secondary | ICD-10-CM | POA: Diagnosis not present

## 2017-02-28 DIAGNOSIS — E785 Hyperlipidemia, unspecified: Secondary | ICD-10-CM

## 2017-02-28 LAB — GLUCOSE, CAPILLARY
Glucose-Capillary: 95 mg/dL (ref 65–99)
Glucose-Capillary: 137 mg/dL — ABNORMAL HIGH (ref 65–99)
Glucose-Capillary: 83 mg/dL (ref 65–99)

## 2017-02-28 LAB — CBC
HCT: 31.2 % — ABNORMAL LOW (ref 36.0–46.0)
Hemoglobin: 9.9 g/dL — ABNORMAL LOW (ref 12.0–15.0)
MCH: 27.5 pg (ref 26.0–34.0)
MCHC: 31.7 g/dL (ref 30.0–36.0)
MCV: 86.7 fL (ref 78.0–100.0)
Platelets: 297 10*3/uL (ref 150–400)
RBC: 3.6 MIL/uL — ABNORMAL LOW (ref 3.87–5.11)
RDW: 18.1 % — ABNORMAL HIGH (ref 11.5–15.5)
WBC: 10.9 10*3/uL — ABNORMAL HIGH (ref 4.0–10.5)

## 2017-02-28 LAB — BASIC METABOLIC PANEL
Anion gap: 7 (ref 5–15)
BUN: 9 mg/dL (ref 6–20)
CO2: 24 mmol/L (ref 22–32)
Calcium: 9.1 mg/dL (ref 8.9–10.3)
Chloride: 107 mmol/L (ref 101–111)
Creatinine, Ser: 1.27 mg/dL — ABNORMAL HIGH (ref 0.44–1.00)
GFR calc Af Amer: 48 mL/min — ABNORMAL LOW (ref >60)
GFR calc non Af Amer: 41 mL/min — ABNORMAL LOW (ref >60)
Glucose, Bld: 93 mg/dL (ref 65–99)
Potassium: 3.6 mmol/L (ref 3.5–5.1)
Sodium: 138 mmol/L (ref 135–145)

## 2017-02-28 LAB — INFLUENZA PANEL BY PCR (TYPE A & B)
Influenza A By PCR: NEGATIVE
Influenza B By PCR: NEGATIVE

## 2017-02-28 NOTE — Assessment & Plan Note (Signed)
Controlled, no change in medication DASH diet and commitment to daily physical activity for a minimum of 30 minutes discussed and encouraged, as a part of hypertension management. The importance of attaining a healthy weight is also discussed.  BP/Weight 02/27/2017 02/26/2017 02/26/2017 02/19/2017 02/15/2017 02/12/2017 3/79/5583  Systolic BP 167 - 425 525 [phone]  Diastolic BP 57 - 71 82 71 70 79  Wt. (Lbs) 222.66 - - - 206.4 204 202.2  BMI - 36.49 - - 33.82 32.93 30.74

## 2017-02-28 NOTE — Assessment & Plan Note (Signed)
Controlled, no change in medication  

## 2017-02-28 NOTE — Assessment & Plan Note (Signed)
Deteriorated. Patient re-educated about  the importance of commitment to a  minimum of 150 minutes of exercise per week.  The importance of healthy food choices with portion control discussed. Encouraged to start a food diary, count calories and to consider  joining a support group. Sample diet sheets offered. Goals set by the patient for the next several months.   Weight /BMI 02/27/2017 02/26/2017 02/15/2017  WEIGHT 222 lb 10.6 oz - 206 lb 6.4 oz  HEIGHT - 5' 5.5" 5' 5.5"  BMI - 36.49 kg/m2 33.82 kg/m2

## 2017-02-28 NOTE — Telephone Encounter (Signed)
Please call pt. She had a 19 simple adenomas and one serrated adenomas removed. FOLLOW A High fiber diet. Next TCS in 2 years. YOUR SISTERS, BROTHERS, CHILDREN, AND PARENTS NEED TO HAVE A COLONOSCOPY STARTING AT THE AGE OF 40.

## 2017-02-28 NOTE — Assessment & Plan Note (Signed)
Symptomatic with h/o recurrent UTI Will treat empirically wih 3 day course of cipro

## 2017-02-28 NOTE — Care Management Note (Signed)
Case Management Note  Patient Details  Name: Cheryl Abbott MRN: [id number] Date of Birth: 1946-04-20  Subjective/Objective:                  Admitted with SIRS. Pt is from home, lives alone. She has strong family support, dtr at bedside. Pt has been recommended for [facility] PT. Pt agreeable. She has used [facility] in the psat and would like to use them again. She is aware HH has 48 hrs to make first visit. She uses RW with ambulation, has RW pta. She has MD to f/u with, transportation to appointments and insurance with drug coverage. She communicates no further needs or concerns about DC.   Action/Plan: DC home today with [place] rep, aware of referral and will obtain info from Epic.   Expected Discharge Date:  02/28/17               Expected Discharge Plan:  [place]  In-House Referral:  NA  Discharge planning Services  CM Consult  Post Acute Care Choice:  Home Health Choice offered to:  Patient  HH Arranged:  PT [place]:  [place]  Status of Service:  Completed, signed off  Sherald Barge, RN 02/28/2017, 1:45 PM

## 2017-02-28 NOTE — Telephone Encounter (Signed)
Reminder in epic °

## 2017-02-28 NOTE — Assessment & Plan Note (Signed)
Three week h/o chronic diarrhea, has upcoming colonoscopy and is being evaluated by gI

## 2017-02-28 NOTE — Discharge Summary (Signed)
Physician Discharge Summary  SPECIAL RANES ONG:[id number] DOB: [birth year] DOA: 02/26/2017  PCP: [name], MD  Admit date: 02/26/2017 Discharge date: 02/28/2017  Admitted From: home Disposition:  home  Recommendations for Outpatient Follow-up:  1. Follow up with PCP in 1-2 weeks 2. Please obtain BMP/CBC in one week 3. Follow up with nephrology for bone marrow biopsy results 4. Follow up with GI for colonoscopy biopsy results  Home Health: HHPT Equipment/Devices:  Discharge Condition: stable CODE STATUS: full code Diet recommendation: Heart Healthy, diabetic diet  Brief/Interim Summary: 71 y/o female admitted to the hospital with SIRS. Patient was noted to be febrile, had an elevated WBC count and was somewhat dehydrated. She recently had a colonoscopy done on the day prior to admission. There was concern that she may have bacteremia with her fever and was admitted for further evaluation. She was started on IV antibiotics. Blood cultures show no growth in 48 hours. It was likely that her fever may be related to atelectasis. I do not have any evidence of pneumonia on chest x-ray. She was treated with IV fluids and leukocytosis has improved. She has not had any further fevers. Overall she is feeling better. We'll plan on discharge home today.   Discharge Diagnoses:  Active Problems:   Type 2 diabetes with nephropathy (HCC)   Hyperlipidemia LDL goal <100   CKD (chronic kidney disease) stage 3, GFR 30-59 ml/min   Morbid obesity (HCC)   SIRS (systemic inflammatory response syndrome) (HCC)    Discharge Instructions  Discharge Instructions    Diet - low sodium heart healthy    Complete by:  As directed    Increase activity slowly    Complete by:  As directed      Allergies as of 02/28/2017      Reactions   Ace Inhibitors Cough   Keflex [cephalexin] Diarrhea, Nausea And Vomiting   Nitrofurantoin Diarrhea, Nausea And Vomiting   Penicillins Hives, Other (See Comments)    Reaction:  Blisters on hands/feet  Has patient had a PCN reaction causing immediate rash, facial/tongue/throat swelling, SOB or lightheadedness with hypotension: No Has patient had a PCN reaction causing severe rash involving mucus membranes or skin necrosis: No Has patient had a PCN reaction that required hospitalization No Has patient had a PCN reaction occurring within the last 10 years: No If all of the above answers are "NO", then may proceed with Cephalosporin use.      Medication List    STOP taking these medications   amLODipine 10 MG tablet Commonly known as:  NORVASC     TAKE these medications   ALPRAZolam 0.5 MG tablet Commonly known as:  XANAX Take 1 tablet (0.5 mg total) by mouth 2 (two) times daily.   aspirin EC 325 MG tablet Take 325 mg by mouth daily.   B-12 PO Take 1 tablet by mouth daily.   diphenhydramine-acetaminophen 25-500 MG Tabs tablet Commonly known as:  TYLENOL PM Take 2 tablets by mouth at bedtime.   docusate sodium 100 MG capsule Commonly known as:  COLACE Take 200 mg by mouth at bedtime.   iron polysaccharides 150 MG capsule Commonly known as:  NIFEREX Take 150 mg by mouth daily.   lovastatin 40 MG tablet Commonly known as:  MEVACOR TAKE 2 TABLETS (80 MG TOTAL) BY MOUTH AT BEDTIME.   OXYGEN Inhale into the lungs daily.   pantoprazole 40 MG tablet Commonly known as:  PROTONIX TAKE 1 TABLET (40 MG TOTAL) BY MOUTH 2 (  TWO) TIMES DAILY.   PARoxetine 40 MG tablet Commonly known as:  PAXIL TAKE 1 TABLET BY MOUTH EVERY MORNING   TRADJENTA 5 MG Tabs tablet Generic drug:  linagliptin TAKE 1 TABLET BY MOUTH EVERY DAY   triamcinolone cream 0.1 % Commonly known as:  KENALOG Apply 1 application topically 3 (three) times daily.   Vitamin D3 5000 units Caps Take 1 capsule by mouth 2 (two) times daily.            Discharge Care Instructions        Start     Ordered   02/28/17 0000  Increase activity slowly     02/28/17 1644    02/28/17 0000  Diet - low sodium heart healthy     02/28/17 1644      Allergies  Allergen Reactions  . Ace Inhibitors Cough  . Keflex [Cephalexin] Diarrhea and Nausea And Vomiting  . Nitrofurantoin Diarrhea and Nausea And Vomiting  . Penicillins Hives and Other (See Comments)    Reaction:  Blisters on hands/feet  Has patient had a PCN reaction causing immediate rash, facial/tongue/throat swelling, SOB or lightheadedness with hypotension: No Has patient had a PCN reaction causing severe rash involving mucus membranes or skin necrosis: No Has patient had a PCN reaction that required hospitalization No Has patient had a PCN reaction occurring within the last 10 years: No If all of the above answers are "NO", then may proceed with Cephalosporin use.    Consultations:     Procedures/Studies: Dg Chest 2 View  Result Date: 02/26/2017 CLINICAL DATA:  Weakness, shortness of breath EXAM: CHEST  2 VIEW COMPARISON:  03/07/2016 FINDINGS: Lower lung volumes with increased bibasilar atelectasis. Normal heart size with mild vascular congestion centrally. No large effusion or pneumothorax. Trachea is midline. Degenerative changes of the spine. IMPRESSION: Lower lung volumes with bibasilar atelectasis. Electronically Signed   By: Jerilynn Mages.  Shick M.D.   On: 02/26/2017 20:05   Ct Biopsy  Result Date: 02/19/2017 INDICATION: Monoclonal paraproteinemia. Please perform CT-guided bone marrow biopsy for tissue diagnostic purposes. EXAM: CT-GUIDED BONE MARROW BIOPSY AND ASPIRATION MEDICATIONS: None ANESTHESIA/SEDATION: Fentanyl 200 mcg IV; Versed 4 mg IV Sedation Time: 11 minutes; The patient was continuously monitored during the procedure by the interventional radiology nurse under my direct supervision. COMPLICATIONS: None immediate. PROCEDURE: Informed consent was obtained from the patient following an explanation of the procedure, risks, benefits and alternatives. The patient understands, agrees and consents for  the procedure. All questions were addressed. A time out was performed prior to the initiation of the procedure. The patient was positioned prone and non-contrast localization CT was performed of the pelvis to demonstrate the iliac marrow spaces. The operative site was prepped and draped in the usual sterile fashion. Under sterile conditions and local anesthesia, a 22 gauge spinal needle was utilized for procedural planning. Next, an 11 gauge coaxial bone biopsy needle was advanced into the left iliac marrow space. Needle position was confirmed with CT imaging. Initially, bone marrow aspiration was performed. Next, a bone marrow biopsy was obtained with the 11 gauge outer bone marrow device. Samples were prepared with the cytotechnologist and deemed adequate. The needle was removed intact. Hemostasis was obtained with compression and a dressing was placed. The patient tolerated the procedure well without immediate post procedural complication. IMPRESSION: Successful CT guided left iliac bone marrow aspiration and core biopsy. Electronically Signed   By: Sandi Mariscal M.D.   On: 02/19/2017 14:22   Dg Bone Survey Met  Result Date: 02/15/2017 CLINICAL DATA:  Monoclonal paraproteinemia. EXAM: METASTATIC BONE SURVEY COMPARISON:  CT scan of August 19, 2015. FINDINGS: There is no definite evidence of lytic lesion or other significant osseous abnormality involving the skull, spine, extremities, pelvis or ribcage. IMPRESSION: No definite lytic lesion seen in the visualized skeleton. Electronically Signed   By: Marijo Conception, M.D.   On: 02/15/2017 15:23   Ct Bone Marrow Biopsy & Aspiration  Result Date: 02/19/2017 INDICATION: Monoclonal paraproteinemia. Please perform CT-guided bone marrow biopsy for tissue diagnostic purposes. EXAM: CT-GUIDED BONE MARROW BIOPSY AND ASPIRATION MEDICATIONS: None ANESTHESIA/SEDATION: Fentanyl 200 mcg IV; Versed 4 mg IV Sedation Time: 11 minutes; The patient was continuously monitored  during the procedure by the interventional radiology nurse under my direct supervision. COMPLICATIONS: None immediate. PROCEDURE: Informed consent was obtained from the patient following an explanation of the procedure, risks, benefits and alternatives. The patient understands, agrees and consents for the procedure. All questions were addressed. A time out was performed prior to the initiation of the procedure. The patient was positioned prone and non-contrast localization CT was performed of the pelvis to demonstrate the iliac marrow spaces. The operative site was prepped and draped in the usual sterile fashion. Under sterile conditions and local anesthesia, a 22 gauge spinal needle was utilized for procedural planning. Next, an 11 gauge coaxial bone biopsy needle was advanced into the left iliac marrow space. Needle position was confirmed with CT imaging. Initially, bone marrow aspiration was performed. Next, a bone marrow biopsy was obtained with the 11 gauge outer bone marrow device. Samples were prepared with the cytotechnologist and deemed adequate. The needle was removed intact. Hemostasis was obtained with compression and a dressing was placed. The patient tolerated the procedure well without immediate post procedural complication. IMPRESSION: Successful CT guided left iliac bone marrow aspiration and core biopsy. Electronically Signed   By: Sandi Mariscal M.D.   On: 02/19/2017 14:22      Subjective: Feeling better. Weakness improving. No cough  Discharge Exam: Vitals:   02/28/17 0635 02/28/17 1500  BP: 130/72 123/68  Pulse: 74 88  Resp: 20 20  Temp: 98.8 F (37.1 C) 98.5 F (36.9 C)  SpO2: 92% 100%   Vitals:   02/27/17 1742 02/27/17 2024 02/28/17 0635 02/28/17 1500  BP:  (!) 117/57 130/72 123/68  Pulse: 74 74 74 88  Resp:  18 20 20   Temp:  98.3 F (36.8 C) 98.8 F (37.1 C) 98.5 F (36.9 C)  TempSrc:  Oral Oral Oral  SpO2: 94% 94% 92% 100%  Weight:      Height:        General:  Pt is alert, awake, not in acute distress Cardiovascular: RRR, S1/S2 +, no rubs, no gallops Respiratory: CTA bilaterally, no wheezing, no rhonchi Abdominal: Soft, NT, ND, bowel sounds + Extremities: no edema, no cyanosis    The results of significant diagnostics from this hospitalization (including imaging, microbiology, ancillary and laboratory) are listed below for reference.     Microbiology: Recent Results (from the past 240 hour(s))  Blood Culture (routine x 2)     Status: None (Preliminary result)   Collection Time: 02/26/17 10:48 PM  Result Value Ref Range Status   Specimen Description BLOOD RIGHT HAND  Final   Special Requests   Final    BOTTLES DRAWN AEROBIC AND ANAEROBIC Blood Culture adequate volume   Culture NO GROWTH 2 DAYS  Final   Report Status PENDING  Incomplete  Blood Culture (routine x  2)     Status: None (Preliminary result)   Collection Time: 02/26/17 10:57 PM  Result Value Ref Range Status   Specimen Description BLOOD RIGHT HAND  Final   Special Requests   Final    BOTTLES DRAWN AEROBIC AND ANAEROBIC Blood Culture adequate volume   Culture NO GROWTH 2 DAYS  Final   Report Status PENDING  Incomplete     Labs: BNP (last 3 results) No results for input(s): BNP in the last 8760 hours. Basic Metabolic Panel:  Recent Labs Lab 02/26/17 1928 02/27/17 0627 02/28/17 0618  NA 136 139 138  K 3.6 3.7 3.6  CL 103 107 107  CO2 25 26 24   GLUCOSE 154* 97 93  BUN 13 12 9   CREATININE 1.58* 1.42* 1.27*  CALCIUM 9.5 9.2 9.1   Liver Function Tests:  Recent Labs Lab 02/26/17 1928  AST 29  ALT 26  ALKPHOS 93  BILITOT 0.3  PROT 8.4*  ALBUMIN 3.7   No results for input(s): LIPASE, AMYLASE in the last 168 hours. No results for input(s): AMMONIA in the last 168 hours. CBC:  Recent Labs Lab 02/26/17 1928 02/27/17 0627 02/28/17 0618  WBC 16.3* 17.5* 10.9*  NEUTROABS 14.3*  --   --   HGB 10.6* 9.7* 9.9*  HCT 32.5* 30.9* 31.2*  MCV 85.8 86.8 86.7  PLT  326 281 297   Cardiac Enzymes: No results for input(s): CKTOTAL, CKMB, CKMBINDEX, TROPONINI in the last 168 hours. BNP: Invalid input(s): POCBNP CBG:  Recent Labs Lab 02/27/17 1701 02/27/17 2008 02/28/17 0744 02/28/17 1110 02/28/17 1643  GLUCAP 79 103* 95 137* 83   D-Dimer No results for input(s): DDIMER in the last 72 hours. Hgb A1c No results for input(s): HGBA1C in the last 72 hours. Lipid Profile No results for input(s): CHOL, HDL, LDLCALC, TRIG, CHOLHDL, LDLDIRECT in the last 72 hours. Thyroid function studies No results for input(s): TSH, T4TOTAL, T3FREE, THYROIDAB in the last 72 hours.  Invalid input(s): FREET3 Anemia work up No results for input(s): VITAMINB12, FOLATE, FERRITIN, TIBC, IRON, RETICCTPCT in the last 72 hours. Urinalysis    Component Value Date/Time   COLORURINE YELLOW 02/26/2017 1921   APPEARANCEUR CLEAR 02/26/2017 1921   LABSPEC 1.013 02/26/2017 1921   PHURINE 6.0 02/26/2017 1921   GLUCOSEU 50 (A) 02/26/2017 1921   HGBUR SMALL (A) 02/26/2017 1921   HGBUR moderate 02/07/2010 0907   BILIRUBINUR NEGATIVE 02/26/2017 1921   BILIRUBINUR neg 09/06/2015 1401   KETONESUR NEGATIVE 02/26/2017 1921   PROTEINUR 30 (A) 02/26/2017 1921   UROBILINOGEN 0.2 09/06/2015 1401   UROBILINOGEN 0.2 03/01/2015 1600   NITRITE NEGATIVE 02/26/2017 1921   LEUKOCYTESUR NEGATIVE 02/26/2017 1921   Sepsis Labs Invalid input(s): PROCALCITONIN,  WBC,  LACTICIDVEN Microbiology Recent Results (from the past 240 hour(s))  Blood Culture (routine x 2)     Status: None (Preliminary result)   Collection Time: 02/26/17 10:48 PM  Result Value Ref Range Status   Specimen Description BLOOD RIGHT HAND  Final   Special Requests   Final    BOTTLES DRAWN AEROBIC AND ANAEROBIC Blood Culture adequate volume   Culture NO GROWTH 2 DAYS  Final   Report Status PENDING  Incomplete  Blood Culture (routine x 2)     Status: None (Preliminary result)   Collection Time: 02/26/17 10:57 PM   Result Value Ref Range Status   Specimen Description BLOOD RIGHT HAND  Final   Special Requests   Final    BOTTLES DRAWN AEROBIC AND  ANAEROBIC Blood Culture adequate volume   Culture NO GROWTH 2 DAYS  Final   Report Status PENDING  Incomplete     Time coordinating discharge: Over 30 minutes  SIGNED:   Kathie Dike, MD  [facility] 02/28/2017, 4:49 PM Pager   If 7PM-7AM, please contact night-coverage [email or web address] Password [email or web address]

## 2017-02-28 NOTE — Progress Notes (Signed)
Physical Therapy Treatment Patient Details Name: Cheryl Abbott MRN: [id number] DOB: 1946-04-18 Today's Date: 02/28/2017    History of Present Illness Cheryl Abbott is a 71 y.o. female  with past mental history significant for anemia, anxiety, reflux, high cholesterol presents emergency room with weakness. Patient had an EGD colonoscopy today. Was uneventful except sore throat. Went home and stayed alone AGAINST MEDICAL ADVICE. Patient was told to have someone with her for 24 hours. Patient began to get very weak and slid to the floor from her couch. Patient was found without her oxygen on. She supposed to wear 2 L chronically.    PT Comments    Patient demonstrates improved endurance for gait training without loss of balance while on room air with O2 sats at 93%.  Patient will benefit from continued physical therapy in hospital and recommended venue below to increase strength, balance, endurance for safe ADLs and gait.   Follow Up Recommendations  Home health PT;Supervision - Intermittent     Equipment Recommendations  None recommended by PT    Recommendations for Other Services       Precautions / Restrictions Precautions Precautions: Fall Restrictions Weight Bearing Restrictions: No    Mobility  Bed Mobility Overal bed mobility: Needs Assistance Bed Mobility: Supine to Sit;Sit to Supine     Supine to sit: Min guard;Supervision Sit to supine: Supervision   General bed mobility comments: head of bed raised  Transfers Overall transfer level: Needs assistance Equipment used: Rolling walker (2 wheeled) Transfers: Sit to/from Stand Sit to Stand: Supervision            Ambulation/Gait Ambulation/Gait assistance: Supervision Ambulation Distance (Feet): 75 Feet Assistive device: Rolling walker (2 wheeled) Gait Pattern/deviations: Decreased step length - right;Decreased step length - left;Decreased stride length   Gait velocity interpretation: Below normal  speed for age/gender General Gait Details: demonstrates slightly labored slow cadence without loss of balance, on room air with O2 Sats at 93% after gait training   Stairs            Wheelchair Mobility    Modified Rankin (Stroke Patients Only)       Balance Overall balance assessment: Needs assistance Sitting-balance support: No upper extremity supported;Feet supported Sitting balance-Leahy Scale: Good     Standing balance support: Bilateral upper extremity supported;During functional activity Standing balance-Leahy Scale: Fair                              Cognition Arousal/Alertness: Awake/alert Behavior During Therapy: WFL for tasks assessed/performed Overall Cognitive Status: Within Functional Limits for tasks assessed                                        Exercises General Exercises - Lower Extremity Ankle Circles/Pumps: Seated;AROM;Both;10 reps    General Comments        Pertinent Vitals/Pain Pain Assessment: 0-10 Pain Score: 5  Pain Location: left hip at biopsy site, and discomfort in bilateral forearms and left shoulder Pain Descriptors / Indicators: Aching Pain Intervention(s): Limited activity within patient's tolerance;Monitored during session    Home Living                      Prior Function            PT Goals (current goals can now be found in the  care plan section) Acute Rehab PT Goals Patient Stated Goal: return home with family to assist PT Goal Formulation: With patient Time For Goal Achievement: 03/01/17 Potential to Achieve Goals: Good Progress towards PT goals: Progressing toward goals    Frequency    Min 3X/week      PT Plan Current plan remains appropriate    Co-evaluation              AM-PAC PT "6 Clicks" Daily Activity  Outcome Measure  Difficulty turning over in bed (including adjusting bedclothes, sheets and blankets)?: None Difficulty moving from lying on back to  sitting on the side of the bed? : None Difficulty sitting down on and standing up from a chair with arms (e.g., wheelchair, bedside commode, etc,.)?: None Help needed moving to and from a bed to chair (including a wheelchair)?: A Little Help needed walking in hospital room?: A Little Help needed climbing 3-5 steps with a railing? : A Little 6 Click Score: 21    End of Session   Activity Tolerance: Patient tolerated treatment well;Patient limited by fatigue;No increased pain Patient left: in chair;with call bell/phone within reach Nurse Communication: Mobility status PT Visit Diagnosis: Unsteadiness on feet (R26.81);Other abnormalities of gait and mobility (R26.89);Muscle weakness (generalized) (M62.81)     Time: 2841-3244 PT Time Calculation (min) (ACUTE ONLY): 25 min  Charges:  $Therapeutic Activity: 23-37 mins                    G Codes:  Functional Assessment Tool Used: AM-PAC 6 Clicks Basic Mobility Functional Limitation: Mobility: Walking and moving around Mobility: Walking and Moving Around Current Status (W1027): At least 20 percent but less than 40 percent impaired, limited or restricted Mobility: Walking and Moving Around Goal Status [phone]): At least 20 percent but less than 40 percent impaired, limited or restricted Mobility: Walking and Moving Around Discharge Status [phone]): At least 20 percent but less than 40 percent impaired, limited or restricted    1:07 PM, 02/28/17 Lonell Grandchild, MPT Physical Therapist with [facility] [phone] office [phone] mobile phone

## 2017-03-01 ENCOUNTER — Other Ambulatory Visit: Payer: Self-pay | Admitting: Family Medicine

## 2017-03-01 NOTE — Telephone Encounter (Signed)
LMOM to call.

## 2017-03-01 NOTE — Telephone Encounter (Signed)
Seen [phone]

## 2017-03-03 LAB — CULTURE, BLOOD (ROUTINE X 2)
Culture: NO GROWTH
Culture: NO GROWTH
Special Requests: ADEQUATE
Special Requests: ADEQUATE

## 2017-03-04 ENCOUNTER — Telehealth: Payer: Self-pay | Admitting: Gastroenterology

## 2017-03-04 ENCOUNTER — Telehealth: Payer: Self-pay | Admitting: Family Medicine

## 2017-03-04 DIAGNOSIS — E1121 Type 2 diabetes mellitus with diabetic nephropathy: Secondary | ICD-10-CM | POA: Diagnosis not present

## 2017-03-04 DIAGNOSIS — N183 Chronic kidney disease, stage 3 (moderate): Secondary | ICD-10-CM | POA: Diagnosis not present

## 2017-03-04 DIAGNOSIS — R6510 Systemic inflammatory response syndrome (SIRS) of non-infectious origin without acute organ dysfunction: Secondary | ICD-10-CM | POA: Diagnosis not present

## 2017-03-04 DIAGNOSIS — D649 Anemia, unspecified: Secondary | ICD-10-CM | POA: Diagnosis not present

## 2017-03-04 DIAGNOSIS — M6281 Muscle weakness (generalized): Secondary | ICD-10-CM | POA: Diagnosis not present

## 2017-03-04 DIAGNOSIS — E785 Hyperlipidemia, unspecified: Secondary | ICD-10-CM | POA: Diagnosis not present

## 2017-03-04 DIAGNOSIS — E1122 Type 2 diabetes mellitus with diabetic chronic kidney disease: Secondary | ICD-10-CM | POA: Diagnosis not present

## 2017-03-04 DIAGNOSIS — G4733 Obstructive sleep apnea (adult) (pediatric): Secondary | ICD-10-CM | POA: Diagnosis not present

## 2017-03-04 DIAGNOSIS — I129 Hypertensive chronic kidney disease with stage 1 through stage 4 chronic kidney disease, or unspecified chronic kidney disease: Secondary | ICD-10-CM | POA: Diagnosis not present

## 2017-03-04 DIAGNOSIS — M1991 Primary osteoarthritis, unspecified site: Secondary | ICD-10-CM | POA: Diagnosis not present

## 2017-03-04 NOTE — Telephone Encounter (Signed)
Please advise if you can authorize

## 2017-03-04 NOTE — Telephone Encounter (Signed)
See result note. Pt is aware.

## 2017-03-04 NOTE — Telephone Encounter (Signed)
PT is aware.

## 2017-03-04 NOTE — Telephone Encounter (Signed)
[place] calling to get a verbal order for physical therapy as patient has recently come home from [facility].   2x a week for 3 wks.  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  She is generally weak.   Patient is high risk for falling    Ben's contact #  [phone]

## 2017-03-04 NOTE — Telephone Encounter (Signed)
Patient returned call [phone]

## 2017-03-05 NOTE — Telephone Encounter (Signed)
Add her on to Dr Camillia Herter schedule next week to address

## 2017-03-06 DIAGNOSIS — G4733 Obstructive sleep apnea (adult) (pediatric): Secondary | ICD-10-CM | POA: Diagnosis not present

## 2017-03-06 DIAGNOSIS — E1121 Type 2 diabetes mellitus with diabetic nephropathy: Secondary | ICD-10-CM | POA: Diagnosis not present

## 2017-03-06 DIAGNOSIS — E1122 Type 2 diabetes mellitus with diabetic chronic kidney disease: Secondary | ICD-10-CM | POA: Diagnosis not present

## 2017-03-06 DIAGNOSIS — I129 Hypertensive chronic kidney disease with stage 1 through stage 4 chronic kidney disease, or unspecified chronic kidney disease: Secondary | ICD-10-CM | POA: Diagnosis not present

## 2017-03-06 DIAGNOSIS — N183 Chronic kidney disease, stage 3 (moderate): Secondary | ICD-10-CM | POA: Diagnosis not present

## 2017-03-06 DIAGNOSIS — E785 Hyperlipidemia, unspecified: Secondary | ICD-10-CM | POA: Diagnosis not present

## 2017-03-06 DIAGNOSIS — M1991 Primary osteoarthritis, unspecified site: Secondary | ICD-10-CM | POA: Diagnosis not present

## 2017-03-06 DIAGNOSIS — M6281 Muscle weakness (generalized): Secondary | ICD-10-CM | POA: Diagnosis not present

## 2017-03-06 DIAGNOSIS — R6510 Systemic inflammatory response syndrome (SIRS) of non-infectious origin without acute organ dysfunction: Secondary | ICD-10-CM | POA: Diagnosis not present

## 2017-03-06 DIAGNOSIS — D649 Anemia, unspecified: Secondary | ICD-10-CM | POA: Diagnosis not present

## 2017-03-06 NOTE — Telephone Encounter (Signed)
Appointment made regarding physical therapy.

## 2017-03-07 ENCOUNTER — Encounter (HOSPITAL_COMMUNITY): Payer: Self-pay

## 2017-03-08 ENCOUNTER — Encounter (HOSPITAL_COMMUNITY): Payer: Self-pay | Admitting: Gastroenterology

## 2017-03-08 LAB — CHROMOSOME ANALYSIS, BONE MARROW

## 2017-03-08 LAB — TISSUE HYBRIDIZATION (BONE MARROW)-NCBH

## 2017-03-11 ENCOUNTER — Encounter: Payer: Self-pay | Admitting: Cardiology

## 2017-03-11 ENCOUNTER — Ambulatory Visit (INDEPENDENT_AMBULATORY_CARE_PROVIDER_SITE_OTHER): Payer: Medicare Other | Admitting: Cardiology

## 2017-03-11 VITALS — BP 116/72 | HR 97 | Ht 66.0 in | Wt 204.2 lb

## 2017-03-11 DIAGNOSIS — R0789 Other chest pain: Secondary | ICD-10-CM

## 2017-03-11 DIAGNOSIS — R0602 Shortness of breath: Secondary | ICD-10-CM

## 2017-03-11 NOTE — Progress Notes (Signed)
Clinical Summary Ms. Wilhite is a 71 y.o.female seen today for follow up of the following medical problems.   1. Chest pain/SOB - echo 02/2016 LVEF 60-65%, grade I diastoilc dysfunction - 12/2013 nuclear stress without ischemia, low risk.  07/2016 PFTs: mild restrictive disease - breathing improving since last visit. Can walk longer distances. Working with PT at home.    2. Fever/Dehydration - admit 02/2017 with fever, negative infectous workup Past Medical History:  Diagnosis Date  . Anemia   . Anxiety   . Anxiety disorder   . Arthritis   . 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 Chronic back pain   . Depression   . Fatty liver   . GERD (gastroesophageal reflux disease)   . Headache   . History of adenomatous polyp of colon    2008  . History of kidney stones   . Hypercalcemia   . Hyperlipidemia   . Hypertension   . Left ureteral stone   . Lipoma of arm 01/19/2015  . Lumbar disc disease with radiculopathy   . Nephrolithiasis   . Neuropathy, peripheral   . OSA treated with BiPAP    per study 2007  . Sciatica   . Sigmoid diverticulosis   . Type 2 diabetes mellitus (HCC)      Allergies  Allergen Reactions  . Ace Inhibitors Cough  . Keflex [Cephalexin] Diarrhea and Nausea And Vomiting  . Nitrofurantoin Diarrhea and Nausea And Vomiting  . Penicillins Hives and Other (See Comments)    Reaction:  Blisters on hands/feet  Has patient had a PCN reaction causing immediate rash, facial/tongue/throat swelling, SOB or lightheadedness with hypotension: No Has patient had a PCN reaction causing severe rash involving mucus membranes or skin necrosis: No Has patient had a PCN reaction that required hospitalization No Has patient had a PCN reaction occurring within the last 10 years: No If all of the above answers are "NO", then may proceed with Cephalosporin use.     Current Outpatient Prescriptions  Medication Sig Dispense Refill  . ALPRAZolam (XANAX) 0.5 MG tablet  Take 1 tablet (0.5 mg total) by mouth 2 (two) times daily. 60 tablet 4  . aspirin EC 325 MG tablet Take 325 mg by mouth daily.    . Cholecalciferol (VITAMIN D3) 5000 units CAPS Take 1 capsule by mouth 2 (two) times daily.    . Cyanocobalamin (B-12 PO) Take 1 tablet by mouth daily.    . diphenhydramine-acetaminophen (TYLENOL PM) 25-500 MG TABS tablet Take 2 tablets by mouth at bedtime.    . docusate sodium (COLACE) 100 MG capsule Take 200 mg by mouth at bedtime.     . iron polysaccharides (NIFEREX) 150 MG capsule Take 150 mg by mouth daily.    Marland Kitchen lovastatin (MEVACOR) 40 MG tablet TAKE 2 TABLETS (80 MG TOTAL) BY MOUTH AT BEDTIME. 180 tablet 1  . OXYGEN Inhale into the lungs daily.    . pantoprazole (PROTONIX) 40 MG tablet TAKE 1 TABLET (40 MG TOTAL) BY MOUTH 2 (TWO) TIMES DAILY. 180 tablet 1  . PARoxetine (PAXIL) 40 MG tablet TAKE 1 TABLET BY MOUTH EVERY MORNING 90 tablet 0  . TRADJENTA 5 MG TABS tablet TAKE 1 TABLET BY MOUTH EVERY DAY 90 tablet 1  . triamcinolone cream (KENALOG) 0.1 % Apply 1 application topically 3 (three) times daily. 30 g 0   No current facility-administered medications for this visit.      Past Surgical History:  Procedure Laterality Date  . BIOPSY  N/A 03/17/2013   Procedure: GASTRIC BIOPSIES;  Surgeon: Danie Binder, MD;  Location: [facility];  Service: Endoscopy;  Laterality: N/A;  . BIOPSY  06/10/2015   Procedure: BIOPSY;  Surgeon: Danie Binder, MD;  [facility];  Service: Endoscopy;;  gastric biopsy  . CARDIAC CATHETERIZATION  05-11-2003  dr Shelva Majestic ( heart center)   Abnormal cardiolite/   Normal coronary arteries and normal LVF,  ef  63%  . CARDIOVASCULAR STRESS TEST  01-01-2014   normal lexiscan cardiolite/  no ischemia/ infarct/  normal LV function and wall motion ,  ef 81%  . COLONOSCOPY  last one 10/02/2011   [facility] TICS, IH, NEXT TCS APR 2018  . COLONOSCOPY WITH PROPOFOL N/A 02/26/2017   Procedure: COLONOSCOPY WITH PROPOFOL;   Surgeon: Danie Binder, MD;  [facility];  Service: Endoscopy;  Laterality: N/A;  11:00am  . CYSTO/   RIGHT URETEROSOCOPY LASER LITHOTRIPSY STONE EXTRACTION  10-13-2004  . CYSTO/  RIGHT RETROGRADE PYELOGRAM/  PLACMENT RIGHT URETERAL STENT  01-10-2010  . CYSTOSCOPY W/ URETERAL STENT PLACEMENT Right 08/19/2015   Procedure: CYSTOSCOPY WITH RETROGRADE PYELOGRAM/URETERAL STENT PLACEMENT;  Surgeon: Franchot Gallo, MD;  Location: [facility];  Service: Urology;  Laterality: Right;  . CYSTOSCOPY WITH RETROGRADE PYELOGRAM, URETEROSCOPY AND STENT PLACEMENT Left 10/21/2014   Procedure: CYSTOSCOPY WITH RETROGRADE PYELOGRAM, URETEROSCOPY AND STENT PLACEMENT;  Surgeon: Franchot Gallo, MD;  Location: [facility];  Service: Urology;  Laterality: Left;  . CYSTOSCOPY WITH RETROGRADE PYELOGRAM, URETEROSCOPY AND STENT PLACEMENT Right 11/22/2015   Procedure: CYSTOSCOPY, RIGHT URETERAL STENT REMOVAL; RIGHT RETROGRADE PYELOGRAM, RIGHT URETEROSCOPY WITH BALLOON DILATION; RIGHT URETERAL STENT PLACEMENT;  Surgeon: Franchot Gallo, MD;  Location: [facility];  Service: Urology;  Laterality: Right;  . CYSTOSCOPY/RETROGRADE/URETEROSCOPY/STONE EXTRACTION WITH BASKET Bilateral 08/02/2015   Procedure: CYSTOSCOPY; BILATERAL RETROGRADE PYELOGRAMS; BILATERAL URETEROSCOPY, STONE EXTRACTION WITH BASKET;  Surgeon: Franchot Gallo, MD;  Location: [facility];  Service: Urology;  Laterality: Bilateral;  . CYSTOSCOPY/RETROGRADE/URETEROSCOPY/STONE EXTRACTION WITH BASKET Right 06/28/2015   Procedure: CYSTOSCOPY/RETROGRADE/URETEROSCOPY/STONE EXTRACTION WITH BASKET, RIGHT URETERAL DOUBLE J STENT PLACEMENT;  Surgeon: Franchot Gallo, MD;  Location: [facility];  Service: Urology;  Laterality: Right;  . ESOPHAGOGASTRODUODENOSCOPY (EGD) WITH PROPOFOL N/A 03/17/2013   Procedure: ESOPHAGOGASTRODUODENOSCOPY (EGD) WITH PROPOFOL;  Surgeon: Danie Binder, MD;  Location: [facility];  Service: Endoscopy;  Laterality: N/A;  .  ESOPHAGOGASTRODUODENOSCOPY (EGD) WITH PROPOFOL N/A 06/10/2015   Distal gastritis, distal esophageal stricture s/p dilation  . ESOPHAGOGASTRODUODENOSCOPY (EGD) WITH PROPOFOL N/A 02/26/2017   Procedure: ESOPHAGOGASTRODUODENOSCOPY (EGD) WITH PROPOFOL;  Surgeon: Danie Binder, MD;  [facility];  Service: Endoscopy;  Laterality: N/A;  . FLEXIBLE SIGMOIDOSCOPY  09/11/2011   [id number] Internal hemorrhoids  . HOLMIUM LASER APPLICATION Left 12/08/5282   Procedure: HOLMIUM LASER APPLICATION;  Surgeon: Franchot Gallo, MD;  Location: [facility];  Service: Urology;  Laterality: Left;  . HOLMIUM LASER APPLICATION Right 1/32/4401   Procedure: HOLMIUM LASER APPLICATION;  Surgeon: Franchot Gallo, MD;  Location: [facility];  Service: Urology;  Laterality: Right;  . MASS EXCISION Left 03/04/2015   Procedure: EXCISION OF SOFT TISSUE NEOPLASM LEFT ARM;  Surgeon: Aviva Signs, MD;  Location: [facility];  Service: General;  Laterality: Left;  . PERCUTANEOUS NEPHROSTOLITHOTOMY Bilateral 12/  2011     [facility]  . POLYPECTOMY N/A 03/17/2013   Procedure: GASTRIC POLYPECTOMY;  Surgeon: Danie Binder, MD;  Location: [facility];  Service: Endoscopy;  Laterality: N/A;  . POLYPECTOMY  02/26/2017  Procedure: POLYPECTOMY;  Surgeon: Danie Binder, MD;  [facility];  Service: Endoscopy;;  polyp at cecum, ascending colon polyps x3, hepatic flexure polyps x8, transverse colon polyps x8   . REMOVAL RIGHT THIGH CYST  2006  . SAVORY DILATION N/A 03/17/2013   Procedure: SAVORY DILATION;  Surgeon: Danie Binder, MD;  Location: [facility];  Service: Endoscopy;  Laterality: N/A;  #12.8, 14, 15, 16 dilators used  . SAVORY DILATION N/A 06/10/2015   Procedure: SAVORY DILATION;  Surgeon: Danie Binder, MD;  [facility];  Service: Endoscopy;  Laterality: N/A;  . TRANSTHORACIC ECHOCARDIOGRAM  01-01-2014   mild LVH/  ef 16-10%/  grade I diastolic dysfunction/  trivial MR, TR, and PR  . VAGINAL  HYSTERECTOMY  1970's     Allergies  Allergen Reactions  . Ace Inhibitors Cough  . Keflex [Cephalexin] Diarrhea and Nausea And Vomiting  . Nitrofurantoin Diarrhea and Nausea And Vomiting  . Penicillins Hives and Other (See Comments)    Reaction:  Blisters on hands/feet  Has patient had a PCN reaction causing immediate rash, facial/tongue/throat swelling, SOB or lightheadedness with hypotension: No Has patient had a PCN reaction causing severe rash involving mucus membranes or skin necrosis: No Has patient had a PCN reaction that required hospitalization No Has patient had a PCN reaction occurring within the last 10 years: No If all of the above answers are "NO", then may proceed with Cephalosporin use.      Family History  Problem Relation Age of Onset  . Hypertension Mother   . Diabetes Mother   . Heart failure Mother   . Dementia Mother   . Emphysema Father   . Hypertension Father   . Diabetes Brother   . GER disease Brother   . Hypertension Sister   . Hypertension Sister   . Cancer Unknown        family history   . Diabetes Unknown        family history   . Heart defect Unknown        famiily history   . Arthritis Unknown        family history   . Anesthesia problems Neg Hx   . Hypotension Neg Hx   . Malignant hyperthermia Neg Hx   . Pseudochol deficiency Neg Hx   . Colon cancer Neg Hx      Social History Ms. Beller reports that she quit smoking about 22 years ago. Her smoking use included Cigarettes. She started smoking about 42 years ago. She has a 20.00 pack-year smoking history. She has never used smokeless tobacco. Ms. Dunaway reports that she does not drink alcohol.   Review of Systems CONSTITUTIONAL: No weight loss, fever, chills, weakness or fatigue.  HEENT: Eyes: No visual loss, blurred vision, double vision or yellow sclerae.No hearing loss, sneezing, congestion, runny nose or sore throat.  SKIN: No rash or itching.  CARDIOVASCULAR: per  hpi RESPIRATORY: per hpi GASTROINTESTINAL: No anorexia, nausea, vomiting or diarrhea. No abdominal pain or blood.  GENITOURINARY: No burning on urination, no polyuria NEUROLOGICAL: No headache, dizziness, syncope, paralysis, ataxia, numbness or tingling in the extremities. No change in bowel or bladder control.  MUSCULOSKELETAL: No muscle, back pain, joint pain or stiffness.  LYMPHATICS: No enlarged nodes. No history of splenectomy.  PSYCHIATRIC: No history of depression or anxiety.  ENDOCRINOLOGIC: No reports of sweating, cold or heat intolerance. No polyuria or polydipsia.  Marland Kitchen   Physical Examination Vitals:   03/11/17 1403  BP: 116/72  Pulse: 97  SpO2: 94%   Vitals:   03/11/17 1403  Weight: 204 lb 3.2 oz (92.6 kg)  Height: 5\' 6"  (1.676 m)    Gen: resting comfortably, no acute distress HEENT: no scleral icterus, pupils equal round and reactive, no palptable cervical adenopathy,  CV: RRR, no m/r/g, no jvd Resp: Clear to auscultation bilaterally GI: abdomen is soft, non-tender, non-distended, normal bowel sounds, no hepatosplenomegaly MSK: extremities are warm, no edema.  Skin: warm, no rash Neuro:  no focal deficits Psych: appropriate affect    Assessment and Plan   1. SOB/Chest pain. - symptoms improving since last visit. Unremarkable cardiac and pulmonary workup thus far. Perhaps related to deconditioning. Follow symptoms as she increased her actvity. If recurrent or progressive symptoms consider LHC/RHC    F/u 6 months     Arnoldo Lenis, M.D.

## 2017-03-11 NOTE — Patient Instructions (Signed)
Your physician wants you to follow-up in: 6 MONTHS WITH DR. BRANCH You will receive a reminder letter in the mail two months in advance. If you don't receive a letter, please call our office to schedule the follow-up appointment.  Your physician recommends that you continue on your current medications as directed. Please refer to the Current Medication list given to you today.  Thank you for choosing [facility]!!    

## 2017-03-12 DIAGNOSIS — Z742 Need for assistance at home and no other household member able to render care: Secondary | ICD-10-CM

## 2017-03-14 DIAGNOSIS — N183 Chronic kidney disease, stage 3 (moderate): Secondary | ICD-10-CM | POA: Diagnosis not present

## 2017-03-14 DIAGNOSIS — E1122 Type 2 diabetes mellitus with diabetic chronic kidney disease: Secondary | ICD-10-CM | POA: Diagnosis not present

## 2017-03-14 DIAGNOSIS — R6510 Systemic inflammatory response syndrome (SIRS) of non-infectious origin without acute organ dysfunction: Secondary | ICD-10-CM | POA: Diagnosis not present

## 2017-03-14 DIAGNOSIS — E785 Hyperlipidemia, unspecified: Secondary | ICD-10-CM | POA: Diagnosis not present

## 2017-03-14 DIAGNOSIS — I129 Hypertensive chronic kidney disease with stage 1 through stage 4 chronic kidney disease, or unspecified chronic kidney disease: Secondary | ICD-10-CM | POA: Diagnosis not present

## 2017-03-14 DIAGNOSIS — M6281 Muscle weakness (generalized): Secondary | ICD-10-CM | POA: Diagnosis not present

## 2017-03-14 DIAGNOSIS — E1121 Type 2 diabetes mellitus with diabetic nephropathy: Secondary | ICD-10-CM | POA: Diagnosis not present

## 2017-03-14 DIAGNOSIS — G4733 Obstructive sleep apnea (adult) (pediatric): Secondary | ICD-10-CM | POA: Diagnosis not present

## 2017-03-14 DIAGNOSIS — M1991 Primary osteoarthritis, unspecified site: Secondary | ICD-10-CM | POA: Diagnosis not present

## 2017-03-14 DIAGNOSIS — D649 Anemia, unspecified: Secondary | ICD-10-CM | POA: Diagnosis not present

## 2017-03-15 ENCOUNTER — Ambulatory Visit (HOSPITAL_COMMUNITY): Payer: Medicare Other

## 2017-03-15 ENCOUNTER — Telehealth: Payer: Self-pay

## 2017-03-15 ENCOUNTER — Encounter (HOSPITAL_COMMUNITY): Payer: Self-pay | Admitting: Oncology

## 2017-03-15 ENCOUNTER — Encounter (HOSPITAL_COMMUNITY): Payer: Medicare Other | Attending: Oncology | Admitting: Oncology

## 2017-03-15 DIAGNOSIS — D649 Anemia, unspecified: Secondary | ICD-10-CM | POA: Diagnosis not present

## 2017-03-15 DIAGNOSIS — C9000 Multiple myeloma not having achieved remission: Secondary | ICD-10-CM | POA: Diagnosis not present

## 2017-03-15 DIAGNOSIS — D472 Monoclonal gammopathy: Secondary | ICD-10-CM | POA: Diagnosis not present

## 2017-03-15 NOTE — Telephone Encounter (Signed)
FYI only Anne Ng from [facility] called to let you know Md Guadron canceled her PT appt today.  She said she did not Pt on the same day as a doctors appt.

## 2017-03-15 NOTE — Progress Notes (Signed)
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  HISTORY OF PRESENTING ILLNESS: Cheryl Abbott 71 y.o. female Presents for evaluation M spike in urine and serum. Patient been seen her nephrologist for continued follow-up of her CKD and had labwork performed on 12/07/16. 24 hour urine IFE which demonstrated Bence-Jones protein positive; kappa type. Immunofixation showed IgG monoclonal proteins with kappa light chain specificity. Urine M spike was 8.3 mg per 24 hours. Urine light chain excess 670 mg/l. Serum immunofixation also showed IgG monoclonal protein with light chain specificity. SPEP demonstrated M spike of 1.5 g/dL. Free kappa light chains 36.8, free lambda light chains 18.5, free kappa/lambda light chain ratio 1.99. Creatinine 1.6. Calcium 9.8. Patient states she feels fatigued. She has been having abdominal bloating for the past one week also. She denies any chest pain or shortness of breath. She denies any nausea or vomiting. She denies any bone pain, however she does have bilateral ankle pains.  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 Skeletal survey on 02/15/2017 was negative for lytic lesions. Bone marrow biopsy on 02/19/2017 demonstrated bone marrow with trilineage hematopoiesis and plasma cells representing 14% of all cells with occasional atypical forms. Immunohistochemical stains were performed and highlighted the plasma cell component in relatively limited bone marrow areas which appear to show kappa light chain excess. Cytogenetics  revealed normal female karyotype with no observable clonal chromosomal abnormalities.  Patient states that she feels fatigued all the time. She recently underwent a colonoscopy and is awaiting the results of her biopsies. She was also recently hospitalized for SIRS from 02/26/17-02/28/17. She denies any chest pain, shortness of breath, abdominal pain. She denies any bone pain.  Review of Systems - Oncology ROS as per HPI otherwise 12 point ROS is negative.   MEDICAL HISTORY: Past Medical History:  Diagnosis Date  . Anemia   . Anxiety   . Anxiety disorder   . Arthritis   . 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 Chronic back pain   . Depression   . Fatty liver   . GERD (gastroesophageal reflux disease)   . Headache   . History of adenomatous polyp of colon    2008  . History of kidney stones   . Hypercalcemia   . Hyperlipidemia   . Hypertension   . Left ureteral stone   . Lipoma of arm 01/19/2015  . Lumbar disc disease with radiculopathy   . Nephrolithiasis   . Neuropathy, peripheral   . OSA treated with BiPAP    per study 2007  . Sciatica   . Sigmoid diverticulosis   . Type 2 diabetes mellitus ([place])     SURGICAL HISTORY: Past Surgical History:  Procedure Laterality Date  . BIOPSY N/A 03/17/2013   Procedure: GASTRIC BIOPSIES;  Surgeon: Danie Binder, MD;  Location: [facility];  Service: Endoscopy;  Laterality: N/A;  . BIOPSY  06/10/2015   Procedure: BIOPSY;  Surgeon: Danie Binder, MD;  [facility];  Service: Endoscopy;;  gastric biopsy  . CARDIAC CATHETERIZATION  05-11-2003  dr Shelva Majestic ([facility])   Abnormal cardiolite/   Normal coronary arteries and normal LVF,  ef  63%  . CARDIOVASCULAR STRESS TEST  01-01-2014   normal lexiscan cardiolite/  no ischemia/ infarct/  normal LV function and wall motion ,  ef 81%  . COLONOSCOPY  last one 10/02/2011   MOD [facility] TICS, IH, NEXT TCS APR 2018  . COLONOSCOPY WITH PROPOFOL N/A 02/26/2017   Procedure:  COLONOSCOPY WITH PROPOFOL;  Surgeon: Danie Binder, MD;  [facility];  Service: Endoscopy;  Laterality: N/A;  11:00am  . CYSTO/   RIGHT URETEROSOCOPY LASER LITHOTRIPSY STONE EXTRACTION  10-13-2004  . CYSTO/  RIGHT RETROGRADE PYELOGRAM/  PLACMENT RIGHT URETERAL STENT  01-10-2010  . CYSTOSCOPY W/ URETERAL STENT PLACEMENT Right 08/19/2015   Procedure: CYSTOSCOPY WITH RETROGRADE PYELOGRAM/URETERAL STENT PLACEMENT;  Surgeon: Franchot Gallo, MD;  Location: [facility];  Service: Urology;  Laterality: Right;  . CYSTOSCOPY WITH RETROGRADE PYELOGRAM, URETEROSCOPY AND STENT PLACEMENT Left 10/21/2014   Procedure: CYSTOSCOPY WITH RETROGRADE PYELOGRAM, URETEROSCOPY AND STENT PLACEMENT;  Surgeon: Franchot Gallo, MD;  Location[facility];  Service: Urology;  Laterality: Left;  . CYSTOSCOPY WITH RETROGRADE PYELOGRAM, URETEROSCOPY AND STENT PLACEMENT Right 11/22/2015   Procedure: CYSTOSCOPY, RIGHT URETERAL STENT REMOVAL; RIGHT RETROGRADE PYELOGRAM, RIGHT URETEROSCOPY WITH BALLOON DILATION; RIGHT URETERAL STENT PLACEMENT;  Surgeon: Franchot Gallo, MD;  Location: [facility];  Service: Urology;  Laterality: Right;  . CYSTOSCOPY/RETROGRADE/URETEROSCOPY/STONE EXTRACTION WITH BASKET Bilateral 08/02/2015   Procedure: CYSTOSCOPY; BILATERAL RETROGRADE PYELOGRAMS; BILATERAL URETEROSCOPY, STONE EXTRACTION WITH BASKET;  Surgeon: Franchot Gallo, MD;  Location: [facility];  Service: Urology;  Laterality: Bilateral;  . CYSTOSCOPY/RETROGRADE/URETEROSCOPY/STONE EXTRACTION WITH BASKET Right 06/28/2015   Procedure: CYSTOSCOPY/RETROGRADE/URETEROSCOPY/STONE EXTRACTION WITH BASKET, RIGHT URETERAL DOUBLE J STENT PLACEMENT;  Surgeon: Franchot Gallo, MD;  Location: [facility];  Service: Urology;  Laterality: Right;  . ESOPHAGOGASTRODUODENOSCOPY (EGD) WITH PROPOFOL N/A 03/17/2013   Procedure: ESOPHAGOGASTRODUODENOSCOPY (EGD) WITH PROPOFOL;  Surgeon: Danie Binder, MD;  Location: [facility];  Service: Endoscopy;  Laterality:  N/A;  . ESOPHAGOGASTRODUODENOSCOPY (EGD) WITH PROPOFOL N/A 06/10/2015   Distal gastritis, distal esophageal stricture s/p dilation  . ESOPHAGOGASTRODUODENOSCOPY (EGD) WITH PROPOFOL N/A 02/26/2017   Procedure: ESOPHAGOGASTRODUODENOSCOPY (EGD) WITH PROPOFOL;  Surgeon: Danie Binder, MD;  [facility];  Service: Endoscopy;  Laterality: N/A;  . FLEXIBLE SIGMOIDOSCOPY  09/11/2011   TIW:[id number] Internal hemorrhoids  . HOLMIUM LASER APPLICATION Left 02/02/538   Procedure: HOLMIUM LASER APPLICATION;  Surgeon: Franchot Gallo, MD;  Location: [facility];  Service: Urology;  Laterality: Left;  . HOLMIUM LASER APPLICATION Right 7/67/3419   Procedure: HOLMIUM LASER APPLICATION;  Surgeon: Franchot Gallo, MD;  Location: [facility];  Service: Urology;  Laterality: Right;  . MASS EXCISION Left 03/04/2015   Procedure: EXCISION OF SOFT TISSUE NEOPLASM LEFT ARM;  Surgeon: Aviva Signs, MD;  Location: [facility];  Service: General;  Laterality: Left;  . PERCUTANEOUS NEPHROSTOLITHOTOMY Bilateral 12/  2011     [facility]  . POLYPECTOMY N/A 03/17/2013   Procedure: GASTRIC POLYPECTOMY;  Surgeon: Danie Binder, MD;  Location: [facility];  Service: Endoscopy;  Laterality: N/A;  . POLYPECTOMY  02/26/2017   Procedure: POLYPECTOMY;  Surgeon: Danie Binder, MD;  [facility];  Service: Endoscopy;;  polyp at cecum, ascending colon polyps x3, hepatic flexure polyps x8, transverse colon polyps x8   . REMOVAL RIGHT THIGH CYST  2006  . SAVORY DILATION N/A 03/17/2013   Procedure: SAVORY DILATION;  Surgeon: Danie Binder, MD;  Location: [facility];  Service: Endoscopy;  Laterality: N/A;  #12.8, 14, 15, 16 dilators used  .  SAVORY DILATION N/A 06/10/2015   Procedure: SAVORY DILATION;  Surgeon: Danie Binder, MD;  [facility];  Service: Endoscopy;  Laterality: N/A;  . TRANSTHORACIC ECHOCARDIOGRAM  01-01-2014   mild LVH/  ef 59-16%/  grade I diastolic dysfunction/  trivial MR, TR, and PR  .  VAGINAL HYSTERECTOMY  1970's    SOCIAL HISTORY: Social History   Social History  . Marital status: Divorced    Spouse name: N/A  . Number of children: 2  . Years of education: N/A   Occupational History  . retired     Social History Main Topics  . Smoking status: Former Smoker    Packs/day: 1.00    Years: 20.00    Types: Cigarettes    Start date: 09/21/1974    Quit date: 09/21/1994  . Smokeless tobacco: Never Used     Comment: Quit x 20 years  . Alcohol use No  . Drug use: No  . Sexual activity: No   Other Topics Concern  . Not on file   Social History Narrative   Patient is right handed   Patient drinks some caffeine daily.    FAMILY HISTORY Family History  Problem Relation Age of Onset  . Hypertension Mother   . Diabetes Mother   . Heart failure Mother   . Dementia Mother   . Emphysema Father   . Hypertension Father   . Diabetes Brother   . GER disease Brother   . Hypertension Sister   . Hypertension Sister   . Cancer Unknown        family history   . Diabetes Unknown        family history   . Heart defect Unknown        famiily history   . Arthritis Unknown        family history   . Anesthesia problems Neg Hx   . Hypotension Neg Hx   . Malignant hyperthermia Neg Hx   . Pseudochol deficiency Neg Hx   . Colon cancer Neg Hx     ALLERGIES:  is allergic to ace inhibitors; keflex [cephalexin]; nitrofurantoin; and penicillins.  MEDICATIONS:  Current Outpatient Prescriptions  Medication Sig Dispense Refill  . ALPRAZolam (XANAX) 0.5 MG tablet Take 1 tablet (0.5 mg total) by mouth 2 (two) times daily. 60 tablet 4  . amLODipine (NORVASC) 10 MG tablet Take 10 mg by mouth daily.    Marland Kitchen aspirin EC 325 MG tablet Take 325 mg by mouth daily.    . Cholecalciferol (VITAMIN D3) 5000 units CAPS Take 1 capsule by mouth 2 (two) times daily.    . Cyanocobalamin (B-12 PO) Take 1 tablet by mouth daily.    . diphenhydramine-acetaminophen (TYLENOL PM) 25-500 MG TABS  tablet Take 2 tablets by mouth at bedtime.    . docusate sodium (COLACE) 100 MG capsule Take 200 mg by mouth at bedtime.     . iron polysaccharides (NIFEREX) 150 MG capsule Take 150 mg by mouth daily.    Marland Kitchen lovastatin (MEVACOR) 40 MG tablet TAKE 2 TABLETS (80 MG TOTAL) BY MOUTH AT BEDTIME. 180 tablet 1  . OXYGEN Inhale into the lungs daily. Bedtime    . pantoprazole (PROTONIX) 40 MG tablet TAKE 1 TABLET (40 MG TOTAL) BY MOUTH 2 (TWO) TIMES DAILY. 180 tablet 1  . PARoxetine (PAXIL) 40 MG tablet TAKE 1 TABLET BY MOUTH EVERY MORNING 90 tablet 0  . TRADJENTA 5 MG TABS tablet TAKE 1 TABLET BY MOUTH EVERY DAY  90 tablet 1  . triamcinolone cream (KENALOG) 0.1 % Apply 1 application topically 3 (three) times daily. 30 g 0   No current facility-administered medications for this visit.     PHYSICAL EXAMINATION:  ECOG PERFORMANCE STATUS: 1 - Symptomatic but completely ambulatory   There were no vitals filed for this visit.  There were no vitals filed for this visit.   Physical Exam Constitutional: Well-developed, well-nourished, and in no distress.   HENT:  Head: Normocephalic and atraumatic.  Mouth/Throat: No oropharyngeal exudate. Mucosa moist. Eyes: Pupils are equal, round, and reactive to light. Conjunctivae are normal. No scleral icterus.  Neck: Normal range of motion. Neck supple. No JVD present.  Cardiovascular: Normal rate, regular rhythm and normal heart sounds.  Exam reveals no gallop and no friction rub.   No murmur heard. Pulmonary/Chest: Effort normal and breath sounds normal. No respiratory distress. No wheezes.No rales.  Abdominal: Soft. Bowel sounds are normal. No distension. There is no tenderness. There is no guarding.  Musculoskeletal: No edema or tenderness.  Lymphadenopathy:    No cervical or supraclavicular adenopathy.  Neurological: Alert and oriented to person, place, and time. No cranial nerve deficit.  Skin: Skin is warm and dry. No rash noted. No erythema. No  pallor.  Psychiatric: Affect and judgment normal.   LABORATORY DATA: I have personally reviewed the data as listed:  Admission on 02/26/2017, Discharged on 02/28/2017  Component Date Value Ref Range Status  . WBC 02/26/2017 16.3* 4.0 - 10.5 K/uL Final  . RBC 02/26/2017 3.79* 3.87 - 5.11 MIL/uL Final  . Hemoglobin 02/26/2017 10.6* 12.0 - 15.0 g/dL Final  . HCT 02/26/2017 32.5* 36.0 - 46.0 % Final  . MCV 02/26/2017 85.8  78.0 - 100.0 fL Final  . MCH 02/26/2017 28.0  26.0 - 34.0 pg Final  . MCHC 02/26/2017 32.6  30.0 - 36.0 g/dL Final  . RDW 02/26/2017 17.9* 11.5 - 15.5 % Final  . Platelets 02/26/2017 326  150 - 400 K/uL Final  . Neutrophils Relative % 02/26/2017 88  % Final  . Neutro Abs 02/26/2017 14.3* 1.7 - 7.7 K/uL Final  . Lymphocytes Relative 02/26/2017 6  % Final  . Lymphs Abs 02/26/2017 1.1  0.7 - 4.0 K/uL Final  . Monocytes Relative 02/26/2017 6  % Final  . Monocytes Absolute 02/26/2017 1.0  0.1 - 1.0 K/uL Final  . Eosinophils Relative 02/26/2017 0  % Final  . Eosinophils Absolute 02/26/2017 0.0  0.0 - 0.7 K/uL Final  . Basophils Relative 02/26/2017 0  % Final  . Basophils Absolute 02/26/2017 0.0  0.0 - 0.1 K/uL Final  . Sodium 02/26/2017 136  135 - 145 mmol/L Final  . Potassium 02/26/2017 3.6  3.5 - 5.1 mmol/L Final  . Chloride 02/26/2017 103  101 - 111 mmol/L Final  . CO2 02/26/2017 25  22 - 32 mmol/L Final  . Glucose, Bld 02/26/2017 154* 65 - 99 mg/dL Final  . BUN 02/26/2017 13  6 - 20 mg/dL Final  . Creatinine, Ser 02/26/2017 1.58* 0.44 - 1.00 mg/dL Final  . Calcium 02/26/2017 9.5  8.9 - 10.3 mg/dL Final  . Total Protein 02/26/2017 8.4* 6.5 - 8.1 g/dL Final  . Albumin 02/26/2017 3.7  3.5 - 5.0 g/dL Final  . AST 02/26/2017 29  15 - 41 U/L Final  . ALT 02/26/2017 26  14 - 54 U/L Final  . Alkaline Phosphatase 02/26/2017 93  38 - 126 U/L Final  . Total Bilirubin 02/26/2017 0.3  0.3 -  1.2 mg/dL Final  . GFR calc non Af Amer 02/26/2017 32* >60 mL/min Final  . GFR calc  Af Amer 02/26/2017 37* >60 mL/min Final   Comment: (NOTE) The eGFR has been calculated using the CKD EPI equation. This calculation has not been validated in all clinical situations. eGFR's persistently <60 mL/min signify possible Chronic Kidney Disease.   . Anion gap 02/26/2017 8  5 - 15 Final  . Color, Urine 02/26/2017 YELLOW  YELLOW Final  . APPearance 02/26/2017 CLEAR  CLEAR Final  . Specific Gravity, Urine 02/26/2017 1.013  1.005 - 1.030 Final  . pH 02/26/2017 6.0  5.0 - 8.0 Final  . Glucose, UA 02/26/2017 50* NEGATIVE mg/dL Final  . Hgb urine dipstick 02/26/2017 SMALL* NEGATIVE Final  . Bilirubin Urine 02/26/2017 NEGATIVE  NEGATIVE Final  . Ketones, ur 02/26/2017 NEGATIVE  NEGATIVE mg/dL Final  . Protein, ur 02/26/2017 30* NEGATIVE mg/dL Final  . Nitrite 02/26/2017 NEGATIVE  NEGATIVE Final  . Leukocytes, UA 02/26/2017 NEGATIVE  NEGATIVE Final  . RBC / HPF 02/26/2017 0-5  0 - 5 RBC/hpf Final  . WBC, UA 02/26/2017 0-5  0 - 5 WBC/hpf Final  . Bacteria, UA 02/26/2017 NONE SEEN  NONE SEEN Final  . Squamous Epithelial / LPF 02/26/2017 0-5* NONE SEEN Final  . Mucus 02/26/2017 PRESENT   Final  . Lactic Acid, Venous 02/26/2017 1.27  0.5 - 1.9 mmol/L Final  . Specimen Description 02/26/2017 BLOOD RIGHT HAND   Final  . Special Requests 02/26/2017 BOTTLES DRAWN AEROBIC AND ANAEROBIC Blood Culture adequate volume   Final  . Culture 02/26/2017 NO GROWTH 5 DAYS   Final  . Report Status 02/26/2017 03/03/2017 FINAL   Final  . Specimen Description 02/26/2017 BLOOD RIGHT HAND   Final  . Special Requests 02/26/2017 BOTTLES DRAWN AEROBIC AND ANAEROBIC Blood Culture adequate volume   Final  . Culture 02/26/2017 NO GROWTH 5 DAYS   Final  . Report Status 02/26/2017 03/03/2017 FINAL   Final  . Sodium 02/27/2017 139  135 - 145 mmol/L Final  . Potassium 02/27/2017 3.7  3.5 - 5.1 mmol/L Final  . Chloride 02/27/2017 107  101 - 111 mmol/L Final  . CO2 02/27/2017 26  22 - 32 mmol/L Final  . Glucose, Bld  02/27/2017 97  65 - 99 mg/dL Final  . BUN 02/27/2017 12  6 - 20 mg/dL Final  . Creatinine, Ser 02/27/2017 1.42* 0.44 - 1.00 mg/dL Final  . Calcium 02/27/2017 9.2  8.9 - 10.3 mg/dL Final  . GFR calc non Af Amer 02/27/2017 36* >60 mL/min Final  . GFR calc Af Amer 02/27/2017 42* >60 mL/min Final   Comment: (NOTE) The eGFR has been calculated using the CKD EPI equation. This calculation has not been validated in all clinical situations. eGFR's persistently <60 mL/min signify possible Chronic Kidney Disease.   . Anion gap 02/27/2017 6  5 - 15 Final  . WBC 02/27/2017 17.5* 4.0 - 10.5 K/uL Final  . RBC 02/27/2017 3.56* 3.87 - 5.11 MIL/uL Final  . Hemoglobin 02/27/2017 9.7* 12.0 - 15.0 g/dL Final  . HCT 02/27/2017 30.9* 36.0 - 46.0 % Final  . MCV 02/27/2017 86.8  78.0 - 100.0 fL Final  . MCH 02/27/2017 27.2  26.0 - 34.0 pg Final  . MCHC 02/27/2017 31.4  30.0 - 36.0 g/dL Final  . RDW 02/27/2017 18.1* 11.5 - 15.5 % Final  . Platelets 02/27/2017 281  150 - 400 K/uL Final  . Glucose-Capillary 02/27/2017 102* 65 - 99  mg/dL Final  . Comment 1 02/27/2017 Notify RN   Final  . Comment 2 02/27/2017 Document in Chart   Final  . Glucose-Capillary 02/27/2017 155* 65 - 99 mg/dL Final  . Comment 1 02/27/2017 Notify RN   Final  . Comment 2 02/27/2017 Document in Chart   Final  . Sodium 02/28/2017 138  135 - 145 mmol/L Final  . Potassium 02/28/2017 3.6  3.5 - 5.1 mmol/L Final  . Chloride 02/28/2017 107  101 - 111 mmol/L Final  . CO2 02/28/2017 24  22 - 32 mmol/L Final  . Glucose, Bld 02/28/2017 93  65 - 99 mg/dL Final  . BUN 02/28/2017 9  6 - 20 mg/dL Final  . Creatinine, Ser 02/28/2017 1.27* 0.44 - 1.00 mg/dL Final  . Calcium 02/28/2017 9.1  8.9 - 10.3 mg/dL Final  . GFR calc non Af Amer 02/28/2017 41* >60 mL/min Final  . GFR calc Af Amer 02/28/2017 48* >60 mL/min Final   Comment: (NOTE) The eGFR has been calculated using the CKD EPI equation. This calculation has not been validated in all clinical  situations. eGFR's persistently <60 mL/min signify possible Chronic Kidney Disease.   . Anion gap 02/28/2017 7  5 - 15 Final  . Glucose-Capillary 02/27/2017 79  65 - 99 mg/dL Final  . Comment 1 02/27/2017 Notify RN   Final  . Comment 2 02/27/2017 Document in Chart   Final  . WBC 02/28/2017 10.9* 4.0 - 10.5 K/uL Final  . RBC 02/28/2017 3.60* 3.87 - 5.11 MIL/uL Final  . Hemoglobin 02/28/2017 9.9* 12.0 - 15.0 g/dL Final  . HCT 02/28/2017 31.2* 36.0 - 46.0 % Final  . MCV 02/28/2017 86.7  78.0 - 100.0 fL Final  . MCH 02/28/2017 27.5  26.0 - 34.0 pg Final  . MCHC 02/28/2017 31.7  30.0 - 36.0 g/dL Final  . RDW 02/28/2017 18.1* 11.5 - 15.5 % Final  . Platelets 02/28/2017 297  150 - 400 K/uL Final  . Glucose-Capillary 02/27/2017 103* 65 - 99 mg/dL Final  . Comment 1 02/27/2017 Notify RN   Final  . Comment 2 02/27/2017 Document in Chart   Final  . Influenza A By PCR 02/27/2017 NEGATIVE  NEGATIVE Final  . Influenza B By PCR 02/27/2017 NEGATIVE  NEGATIVE Final   Comment: (NOTE) The Xpert Xpress Flu assay is intended as an aid in the diagnosis of  influenza and should not be used as a sole basis for treatment.  This  assay is FDA approved for nasopharyngeal swab specimens only. Nasal  washings and aspirates are unacceptable for Xpert Xpress Flu testing.   . Glucose-Capillary 02/28/2017 95  65 - 99 mg/dL Final  . Comment 1 02/28/2017 Notify RN   Final  . Comment 2 02/28/2017 Document in Chart   Final  . Glucose-Capillary 02/28/2017 137* 65 - 99 mg/dL Final  . Comment 1 02/28/2017 Notify RN   Final  . Comment 2 02/28/2017 Document in Chart   Final  . Glucose-Capillary 02/28/2017 83  65 - 99 mg/dL Final  . Comment 1 02/28/2017 Notify RN   Final  . Comment 2 02/28/2017 Document in Chart   Final  Admission on 02/26/2017, Discharged on 02/26/2017  Component Date Value Ref Range Status  . Glucose-Capillary 02/26/2017 123* 65 - 99 mg/dL Final  . Glucose-Capillary 02/26/2017 145* 65 - 99 mg/dL  Final  Hospital Outpatient Visit on 02/19/2017  Component Date Value Ref Range Status  . Sodium 02/19/2017 136  135 - 145 mmol/L  Final  . Potassium 02/19/2017 3.6  3.5 - 5.1 mmol/L Final  . Chloride 02/19/2017 103  101 - 111 mmol/L Final  . CO2 02/19/2017 23  22 - 32 mmol/L Final  . Glucose, Bld 02/19/2017 148* 65 - 99 mg/dL Final  . BUN 02/19/2017 13  6 - 20 mg/dL Final  . Creatinine, Ser 02/19/2017 1.64* 0.44 - 1.00 mg/dL Final  . Calcium 02/19/2017 9.5  8.9 - 10.3 mg/dL Final  . GFR calc non Af Amer 02/19/2017 30* >60 mL/min Final  . GFR calc Af Amer 02/19/2017 35* >60 mL/min Final   Comment: (NOTE) The eGFR has been calculated using the CKD EPI equation. This calculation has not been validated in all clinical situations. eGFR's persistently <60 mL/min signify possible Chronic Kidney Disease.   . Anion gap 02/19/2017 10  5 - 15 Final  . WBC 02/19/2017 7.9  4.0 - 10.5 K/uL Final  . RBC 02/19/2017 4.24  3.87 - 5.11 MIL/uL Final  . Hemoglobin 02/19/2017 11.7* 12.0 - 15.0 g/dL Final  . HCT 02/19/2017 36.2  36.0 - 46.0 % Final  . MCV 02/19/2017 85.4  78.0 - 100.0 fL Final  . MCH 02/19/2017 27.6  26.0 - 34.0 pg Final  . MCHC 02/19/2017 32.3  30.0 - 36.0 g/dL Final  . RDW 02/19/2017 18.4* 11.5 - 15.5 % Final  . Platelets 02/19/2017 337  150 - 400 K/uL Final  . Neutrophils Relative % 02/19/2017 65  % Final  . Neutro Abs 02/19/2017 5.1  1.7 - 7.7 K/uL Final  . Lymphocytes Relative 02/19/2017 22  % Final  . Lymphs Abs 02/19/2017 1.7  0.7 - 4.0 K/uL Final  . Monocytes Relative 02/19/2017 11  % Final  . Monocytes Absolute 02/19/2017 0.9  0.1 - 1.0 K/uL Final  . Eosinophils Relative 02/19/2017 2  % Final  . Eosinophils Absolute 02/19/2017 0.1  0.0 - 0.7 K/uL Final  . Basophils Relative 02/19/2017 0  % Final  . Basophils Absolute 02/19/2017 0.0  0.0 - 0.1 K/uL Final  . Prothrombin Time 02/19/2017 12.7  11.4 - 15.2 seconds Final  . INR 02/19/2017 0.96   Final  . Chromosome-Routine  02/19/2017 SEE SEPARATE REPORT   Final   Performed at [facility]  . Tissue hybridization (bone marrow)* 02/19/2017 SEE SEPARATE REPORT   Final   Performed at [facility] Outpatient Visit on 02/19/2017  Component Date Value Ref Range Status  . Glucose-Capillary 02/19/2017 152* 65 - 99 mg/dL Final  Lab on 02/15/2017  Component Date Value Ref Range Status  . Beta-2 Microglobulin 02/15/2017 3.6* 0.6 - 2.4 mg/L Final   Comment: (NOTE) Siemens Immulite 2000 Immunochemiluminometric assay [facility]) Performed At: [facility] [place], [place] [id number] Lindon Romp MD [id number]   . Ferritin 02/15/2017 8* 11 - 307 ng/mL Final   Performed at [facility], [place] [address] [place], [place] [address]  . Iron 02/15/2017 44  28 - 170 ug/dL Final  . TIBC 02/15/2017 479* 250 - 450 ug/dL Final  . Saturation Ratios 02/15/2017 9* 10.4 - 31.8 % Final  . UIBC 02/15/2017 435  ug/dL Final   Performed at [facility], [place] [address], [place], [place] [address]  . Erythropoietin 02/15/2017 27.2* 2.6 - 18.5 mIU/mL Final   Comment: (NOTE) [employer] Coul[employer] UniCel DxI 800 Immunoassay System Performed At: [facility] [place], [place] [id number] Lindon Romp MD QP:[id number]   . Retic  Ct Pct 02/15/2017 1.4  0.4 - 3.1 % Final  . RBC. 02/15/2017 4.24  3.87 - 5.11 MIL/uL Final  . Retic Count, Absolute 02/15/2017 59.4  19.0 - 186.0 K/uL Final  . IgG (Immunoglobin G), Serum 02/15/2017 2230* 700 - 1,600 mg/dL Final  . IgA 02/15/2017 236  64 - 422 mg/dL Final  . IgM (Immunoglobulin M), Srm 02/15/2017 140  26 - 217 mg/dL Final  . Total Protein ELP 02/15/2017 8.4  6.0 - 8.5 g/dL Corrected  . Albumin SerPl Elph-Mcnc 02/15/2017 3.7  2.9 - 4.4 g/dL Corrected  . Alpha 1 02/15/2017 0.2  0.0 - 0.4 g/dL Corrected  . Alpha2 Glob SerPl Elph-Mcnc 02/15/2017 1.0  0.4 - 1.0 g/dL Corrected  . B-Globulin SerPl Elph-Mcnc 02/15/2017 1.3   0.7 - 1.3 g/dL Corrected  . Gamma Glob SerPl Elph-Mcnc 02/15/2017 2.1* 0.4 - 1.8 g/dL Corrected  . M Protein SerPl Elph-Mcnc 02/15/2017 1.5* Not Observed g/dL Corrected  . Globulin, Total 02/15/2017 4.7* 2.2 - 3.9 g/dL Corrected  . Albumin/Glob SerPl 02/15/2017 0.8  0.7 - 1.7 Corrected  . IFE 1 02/15/2017 Comment   Corrected   Comment: (NOTE) Immunofixation shows IgG monoclonal protein with kappa light chain specificity.   . Please Note 02/15/2017 Comment   Corrected   Comment: (NOTE) Protein electrophoresis scan will follow via computer, mail, or courier delivery. Performed At: [facility] [place], [place] [id number] Lindon Romp MD JJ:[id number]   . Kappa free light chain 02/15/2017 44.6* 3.3 - 19.4 mg/L Final  . Lamda free light chains 02/15/2017 18.0  5.7 - 26.3 mg/L Final  . Kappa, lamda light chain ratio 02/15/2017 2.48* 0.26 - 1.65 Final   Comment: (NOTE) Performed At: [facility] [place], [place] [id number] Lindon Romp MD [id number]   . Total Protein, Urine 02/17/2017 30.9  Not Estab. mg/dL Final  . Total Protein, Urine-Ur/day 02/17/2017 734* 30 - 150 mg/24 hr Final  . Albumin, U 02/17/2017 26.1  % Final  . ALPHA 1 URINE 02/17/2017 7.0  % Final  . Alpha 2, Urine 02/17/2017 14.1  % Final  . % BETA, Urine 02/17/2017 26.9  % Final  . GAMMA GLOBULIN URINE 02/17/2017 25.8  % Final  . Free Lt Chn Excr Rate 02/17/2017 670.00* 1.35 - 24.19 mg/L Final   **Results verified by repeat testing**  . Free Lambda Lt Chains,Ur 02/17/2017 24.60* 0.24 - 6.66 mg/L Corrected  . Free Kappa/Lambda Ratio 02/17/2017 27.24* 2.04 - 10.37 Corrected   Comment: (NOTE) Performed At: [facility] [place], [place] [id number] Lindon Romp MD [id number]   . Immunofixation Result, Urine 02/17/2017 Comment   Corrected   Comment: (NOTE) Bence Jones Protein positive; kappa type. Immunofixation shows IgG monoclonal  protein with kappa light chain specificity.   . Total Volume 02/17/2017 2375 ML   Final  . M-SPIKE %, Urine 02/17/2017 5.1* Not Observed % Corrected  . M-Spike, Mg/24 Hr 02/17/2017 37* Not Observed mg/24 hr Corrected  . Note: 02/17/2017 Comment   Corrected   Comment: (NOTE) Protein electrophoresis scan will follow via computer, mail, or courier delivery.    PATHOLOGY: Diagnosis Bone Marrow, Aspirate,Biopsy, and Clot, left iliac crest - BONE MARROW WITH TRILINEAGE HEMATOPOIESIS AND PLASMACYTOSIS. - SEE COMMENT. PERIPHERAL BLOOD: - MILD NORMOCYTIC-NORMOCHROMIC ANEMIA. Diagnosis Note The clot and biopsy sections are suboptimal for evaluation. The aspirate shows trilineage hematopoiesis with non-specific changes, but there is increased number of plasma cells representing 14% of all  cells with occasional atypical forms. Immunohistochemical stains were performed and highlight the plasma cell component in relatively limited bone marrow areas which appear to show kappa light chain excess. In the presence of a monoclonal protein with kappa light specificity, the limited findings favor plasma cell neoplasm. Correlation with cytogenetic and FISH studies are recommended. (BNS:ah/gt 02/20/17) Susanne Greenhouse MD Pathologist, Electronic RADIOGRAPHIC STUDIES: I have personally reviewed the radiological images as listed and agree with the findings in the report  No results found.  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 Bone marrow biopsy demonstrated 14% plasma cells with normal cytogenetics. Bone survey negative for lytic lesions.  I have reviewed patient's diagnosis of multiple myeloma in detail with the patient and her daughter today. I have discussed CRAB (hypercalcemia, renal failure with creatinine >2, anemia <10 g/dL, bone lytic lesions) in detail with the patient and her daughter as manifestations of MM. Patient currently is on the borderline of  needing to start induction treatment for multiple myeloma. Patient does not have any of the CRAB symptoms except for anemia. Her hemoglobin in September has ranged between 9.9-11 g/dl.  Discussed induction treatment with revlimid, velcade, and decadron in detail with them today as well as the potential side effects. Patient stated that she would prefer to hold off for now from starting treatment so that she may recuperate from her recent illness and colonoscopy. Therefore we will do short term follow up and see her back in 6 weeks and repeat her MM labs. I have told her that should her anemia worsen at that time or she develop new signs/symptoms of progressive MM, then we will definitely initiate treatment at that time. She verbalized understanding. We will send a copy of my note today to her nephrologist Dr. Theador Hawthorne as well.    Orders Placed This Encounter  Procedures  . CBC with Differential    Standing Status:   Future    Standing Expiration Date:   03/15/2018  . Comprehensive metabolic panel    Standing Status:   Future    Standing Expiration Date:   03/15/2018  . Multiple Myeloma Panel (SPEP&IFE w/QIG)    Standing Status:   Future    Standing Expiration Date:   03/15/2018  . Kappa/lambda light chains    Standing Status:   Future    Standing Expiration Date:   03/15/2018  . Beta 2 microglobuline, serum    Standing Status:   Future    Standing Expiration Date:   03/15/2018    All questions were answered. The patient knows to call the clinic with any problems, questions or concerns.  This note was electronically signed.    Twana First, MD  03/16/2017 12:06 PM

## 2017-03-18 ENCOUNTER — Ambulatory Visit (INDEPENDENT_AMBULATORY_CARE_PROVIDER_SITE_OTHER): Payer: Medicare Other | Admitting: Family Medicine

## 2017-03-18 ENCOUNTER — Encounter: Payer: Self-pay | Admitting: Family Medicine

## 2017-03-18 ENCOUNTER — Encounter (HOSPITAL_COMMUNITY): Payer: Self-pay | Admitting: Emergency Medicine

## 2017-03-18 VITALS — BP 126/78 | HR 92 | Temp 98.0°F | Resp 16 | Ht 66.0 in | Wt 204.0 lb

## 2017-03-18 DIAGNOSIS — I10 Essential (primary) hypertension: Secondary | ICD-10-CM

## 2017-03-18 DIAGNOSIS — F411 Generalized anxiety disorder: Secondary | ICD-10-CM | POA: Diagnosis not present

## 2017-03-18 DIAGNOSIS — E785 Hyperlipidemia, unspecified: Secondary | ICD-10-CM | POA: Diagnosis not present

## 2017-03-18 DIAGNOSIS — E1169 Type 2 diabetes mellitus with other specified complication: Secondary | ICD-10-CM | POA: Diagnosis not present

## 2017-03-18 DIAGNOSIS — E669 Obesity, unspecified: Secondary | ICD-10-CM | POA: Diagnosis not present

## 2017-03-18 DIAGNOSIS — E1121 Type 2 diabetes mellitus with diabetic nephropathy: Secondary | ICD-10-CM

## 2017-03-18 DIAGNOSIS — C9000 Multiple myeloma not having achieved remission: Secondary | ICD-10-CM

## 2017-03-18 MED ORDER — ALPRAZOLAM 0.5 MG PO TABS: 0.5000 mg | ORAL_TABLET | Freq: Two times a day (BID) | ORAL | 4 refills | Status: DC

## 2017-03-18 MED ORDER — PAROXETINE HCL 40 MG PO TABS: 40.0000 mg | ORAL_TABLET | Freq: Every morning | ORAL | 1 refills | Status: DC

## 2017-03-18 MED ORDER — AMLODIPINE BESYLATE 10 MG PO TABS: 10.0000 mg | ORAL_TABLET | Freq: Every day | ORAL | 3 refills | Status: DC

## 2017-03-18 NOTE — Progress Notes (Signed)
Office note from Dr Talbert Cage faxed to Dr Theador Hawthorne.

## 2017-03-18 NOTE — Patient Instructions (Addendum)
F/u with MD in [place], CALL IF YOU NEEDME SOONER  NON FAST hbA1c 3 TO 5 DAYS BEFORE VISIT  GOOD FOOT EXAM TODAY  NO CHANGE IN MEDICATION TODAY   FORMS WILL BE COMPLETED TODAY  FOR MAILING OUT   It is important that you exercise regularly at least 30 minutes 5 times a week. If you develop chest pain, have severe difficulty breathing, or feel very tired, stop exercising immediately and seek medical attention  Thank you  for choosing [facility]. We consider it a privelige to serve you.  Delivering excellent health care in a caring and  compassionate way is our goal.  Partnering with you,  so that together we can achieve this goal is our strategy.

## 2017-03-19 DIAGNOSIS — N183 Chronic kidney disease, stage 3 (moderate): Secondary | ICD-10-CM | POA: Diagnosis not present

## 2017-03-19 DIAGNOSIS — E785 Hyperlipidemia, unspecified: Secondary | ICD-10-CM | POA: Diagnosis not present

## 2017-03-19 DIAGNOSIS — E1122 Type 2 diabetes mellitus with diabetic chronic kidney disease: Secondary | ICD-10-CM | POA: Diagnosis not present

## 2017-03-19 DIAGNOSIS — G4733 Obstructive sleep apnea (adult) (pediatric): Secondary | ICD-10-CM | POA: Diagnosis not present

## 2017-03-19 DIAGNOSIS — R0902 Hypoxemia: Secondary | ICD-10-CM | POA: Diagnosis not present

## 2017-03-19 DIAGNOSIS — E1121 Type 2 diabetes mellitus with diabetic nephropathy: Secondary | ICD-10-CM | POA: Diagnosis not present

## 2017-03-19 DIAGNOSIS — D649 Anemia, unspecified: Secondary | ICD-10-CM | POA: Diagnosis not present

## 2017-03-19 DIAGNOSIS — M6281 Muscle weakness (generalized): Secondary | ICD-10-CM | POA: Diagnosis not present

## 2017-03-19 DIAGNOSIS — R6510 Systemic inflammatory response syndrome (SIRS) of non-infectious origin without acute organ dysfunction: Secondary | ICD-10-CM | POA: Diagnosis not present

## 2017-03-19 DIAGNOSIS — M1991 Primary osteoarthritis, unspecified site: Secondary | ICD-10-CM | POA: Diagnosis not present

## 2017-03-19 DIAGNOSIS — I129 Hypertensive chronic kidney disease with stage 1 through stage 4 chronic kidney disease, or unspecified chronic kidney disease: Secondary | ICD-10-CM | POA: Diagnosis not present

## 2017-03-20 ENCOUNTER — Ambulatory Visit (INDEPENDENT_AMBULATORY_CARE_PROVIDER_SITE_OTHER): Payer: Medicare Other

## 2017-03-20 VITALS — BP 122/70 | HR 90 | Temp 98.1°F | Ht 66.0 in | Wt 203.1 lb

## 2017-03-20 DIAGNOSIS — Z0000 Encounter for general adult medical examination without abnormal findings: Secondary | ICD-10-CM

## 2017-03-20 NOTE — Progress Notes (Signed)
Subjective:   Cheryl Abbott is a 71 y.o. female who presents for Medicare Annual (Subsequent) preventive examination.  Review of Systems:  Cardiac Risk Factors include: advanced age (>66men, >38 women);diabetes mellitus;dyslipidemia;hypertension;obesity (BMI >30kg/m2)     Objective:     Vitals: BP 122/70   Pulse 90   Temp 98.1 F (36.7 C) (Temporal)   Ht 5\' 6"  (1.676 m)   Wt 203 lb 1.3 oz (92.1 kg)   SpO2 96%   BMI 32.78 kg/m   Body mass index is 32.78 kg/m.   Tobacco History  Smoking Status  . Former Smoker  . Packs/day: 1.00  . Years: 20.00  . Types: Cigarettes  . Start date: 09/21/1974  . Quit date: 09/20/1988  Smokeless Tobacco  . Never Used    Comment: Quit x 20 years     Counseling given: Not Answered   Past Medical History:  Diagnosis Date  . Anemia   . Anxiety   . Anxiety disorder   . Arthritis   . 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 Chronic back pain   . Depression   . Fatty liver   . GERD (gastroesophageal reflux disease)   . Headache   . History of adenomatous polyp of colon    2008  . History of kidney stones   . Hypercalcemia   . Hyperlipidemia   . Hypertension   . Left ureteral stone   . Lipoma of arm 01/19/2015  . Lumbar disc disease with radiculopathy   . Nephrolithiasis   . Neuropathy, peripheral   . OSA treated with BiPAP    per study 2007  . Sciatica   . Sigmoid diverticulosis   . Type 2 diabetes mellitus ([place])    Past Surgical History:  Procedure Laterality Date  . BIOPSY N/A 03/17/2013   Procedure: GASTRIC BIOPSIES;  Surgeon: Danie Binder, MD;  Location: [facility];  Service: Endoscopy;  Laterality: N/A;  . BIOPSY  06/10/2015   Procedure: BIOPSY;  Surgeon: Danie Binder, MD;  [facility];  Service: Endoscopy;;  gastric biopsy  . CARDIAC CATHETERIZATION  05-11-2003  dr Shelva Majestic ([facility])   Abnormal cardiolite/   Normal coronary arteries and normal LVF,  ef  63%  . CARDIOVASCULAR  STRESS TEST  01-01-2014   normal lexiscan cardiolite/  no ischemia/ infarct/  normal LV function and wall motion ,  ef 81%  . COLONOSCOPY  last one 10/02/2011   MOD [place] TICS, IH, NEXT TCS APR 2018  . COLONOSCOPY WITH PROPOFOL N/A 02/26/2017   Procedure: COLONOSCOPY WITH PROPOFOL;  Surgeon: Danie Binder, MD;  [facility]: [facility];  Service: Endoscopy;  Laterality: N/A;  11:00am  . CYSTO/   RIGHT URETEROSOCOPY LASER LITHOTRIPSY STONE EXTRACTION  10-13-2004  . CYSTO/  RIGHT RETROGRADE PYELOGRAM/  PLACMENT RIGHT URETERAL STENT  01-10-2010  . CYSTOSCOPY W/ URETERAL STENT PLACEMENT Right 08/19/2015   Procedure: CYSTOSCOPY WITH RETROGRADE PYELOGRAM/URETERAL STENT PLACEMENT;  Surgeon: Franchot Gallo, MD;  Location: [facility];  Service: Urology;  Laterality: Right;  . CYSTOSCOPY WITH RETROGRADE PYELOGRAM, URETEROSCOPY AND STENT PLACEMENT Left 10/21/2014   Procedure: CYSTOSCOPY WITH RETROGRADE PYELOGRAM, URETEROSCOPY AND STENT PLACEMENT;  Surgeon: Franchot Gallo, MD;  Location[facility];  Service: Urology;  Laterality: Left;  . CYSTOSCOPY WITH RETROGRADE PYELOGRAM, URETEROSCOPY AND STENT PLACEMENT Right 11/22/2015   Procedure: CYSTOSCOPY, RIGHT URETERAL STENT REMOVAL; RIGHT RETROGRADE PYELOGRAM, RIGHT URETEROSCOPY WITH BALLOON DILATION; RIGHT URETERAL STENT PLACEMENT;  Surgeon: Franchot Gallo, MD;  Location: [facility];  Service: Urology;  Laterality: Right;  . CYSTOSCOPY/RETROGRADE/URETEROSCOPY/STONE EXTRACTION WITH BASKET Bilateral 08/02/2015   Procedure: CYSTOSCOPY; BILATERAL RETROGRADE PYELOGRAMS; BILATERAL URETEROSCOPY, STONE EXTRACTION WITH BASKET;  Surgeon: Franchot Gallo, MD;  Location: [facility];  Service: Urology;  Laterality: Bilateral;  . CYSTOSCOPY/RETROGRADE/URETEROSCOPY/STONE EXTRACTION WITH BASKET Right 06/28/2015   Procedure: CYSTOSCOPY/RETROGRADE/URETEROSCOPY/STONE EXTRACTION WITH BASKET, RIGHT URETERAL DOUBLE J STENT PLACEMENT;  Surgeon: Franchot Gallo, MD;  Location:  [facility];  Service: Urology;  Laterality: Right;  . ESOPHAGOGASTRODUODENOSCOPY (EGD) WITH PROPOFOL N/A 03/17/2013   Procedure: ESOPHAGOGASTRODUODENOSCOPY (EGD) WITH PROPOFOL;  Surgeon: Danie Binder, MD;  Location: [facility];  Service: Endoscopy;  Laterality: N/A;  . ESOPHAGOGASTRODUODENOSCOPY (EGD) WITH PROPOFOL N/A 06/10/2015   Distal gastritis, distal esophageal stricture s/p dilation  . ESOPHAGOGASTRODUODENOSCOPY (EGD) WITH PROPOFOL N/A 02/26/2017   Procedure: ESOPHAGOGASTRODUODENOSCOPY (EGD) WITH PROPOFOL;  Surgeon: Danie Binder, MD;  [facility];  Service: Endoscopy;  Laterality: N/A;  . FLEXIBLE SIGMOIDOSCOPY  09/11/2011   [id number] Internal hemorrhoids  . HOLMIUM LASER APPLICATION Left 10/11/7739   Procedure: HOLMIUM LASER APPLICATION;  Surgeon: Franchot Gallo, MD;  Location[facility];  Service: Urology;  Laterality: Left;  . HOLMIUM LASER APPLICATION Right 2/87/8676   Procedure: HOLMIUM LASER APPLICATION;  Surgeon: Franchot Gallo, MD;  Location: [facility];  Service: Urology;  Laterality: Right;  . MASS EXCISION Left 03/04/2015   Procedure: EXCISION OF SOFT TISSUE NEOPLASM LEFT ARM;  Surgeon: Aviva Signs, MD;  Location: [facility];  Service: General;  Laterality: Left;  . PERCUTANEOUS NEPHROSTOLITHOTOMY Bilateral 12/  2011     [facility]  . POLYPECTOMY N/A 03/17/2013   Procedure: GASTRIC POLYPECTOMY;  Surgeon: Danie Binder, MD;  Location: [facility];  Service: Endoscopy;  Laterality: N/A;  . POLYPECTOMY  02/26/2017   Procedure: POLYPECTOMY;  Surgeon: Danie Binder, MD;  [facility];  Service: Endoscopy;;  polyp at cecum, ascending colon polyps x3, hepatic flexure polyps x8, transverse colon polyps x8   . REMOVAL RIGHT THIGH CYST  2006  . SAVORY DILATION N/A 03/17/2013   Procedure: SAVORY DILATION;  Surgeon: Danie Binder, MD;  Location: [facility];  Service: Endoscopy;  Laterality: N/A;  #12.8, 14, 15, 16 dilators used  . SAVORY DILATION N/A 06/10/2015    Procedure: SAVORY DILATION;  Surgeon: Danie Binder, MD;  [facility];  Service: Endoscopy;  Laterality: N/A;  . TRANSTHORACIC ECHOCARDIOGRAM  01-01-2014   mild LVH/  ef 72-09%/  grade I diastolic dysfunction/  trivial MR, TR, and PR  . VAGINAL HYSTERECTOMY  1970's   Family History  Problem Relation Age of Onset  . Hypertension Mother   . Diabetes Mother   . Heart failure Mother   . Dementia Mother   . Emphysema Father   . Hypertension Father   . Diabetes Brother   . GER disease Brother   . Hypertension Sister   . Hypertension Sister   . Cancer Unknown        family history   . Diabetes Unknown        family history   . Heart defect Unknown        famiily history   . Arthritis Unknown        family history   . Anesthesia problems Neg Hx   . Hypotension Neg Hx   . Malignant hyperthermia Neg Hx   . Pseudochol deficiency Neg Hx   . Colon cancer Neg Hx    History  Sexual  Activity  . Sexual activity: No    Outpatient Encounter Prescriptions as of 03/20/2017  Medication Sig  . ALPRAZolam (XANAX) 0.5 MG tablet Take 1 tablet (0.5 mg total) by mouth 2 (two) times daily.  Marland Kitchen amLODipine (NORVASC) 10 MG tablet Take 1 tablet (10 mg total) by mouth daily.  Marland Kitchen aspirin EC 325 MG tablet Take 325 mg by mouth daily.  . Cholecalciferol (VITAMIN D3) 5000 units CAPS Take 1 capsule by mouth 2 (two) times daily.  . clopidogrel (PLAVIX) 75 MG tablet Take 75 mg by mouth daily.  . Cyanocobalamin (B-12 PO) Take 1 tablet by mouth daily.  . diphenhydramine-acetaminophen (TYLENOL PM) 25-500 MG TABS tablet Take 2 tablets by mouth at bedtime.  . docusate sodium (COLACE) 100 MG capsule Take 200 mg by mouth at bedtime.   . iron polysaccharides (NIFEREX) 150 MG capsule Take 150 mg by mouth daily.  Marland Kitchen lovastatin (MEVACOR) 40 MG tablet TAKE 2 TABLETS (80 MG TOTAL) BY MOUTH AT BEDTIME.  Marland Kitchen OXYGEN Inhale into the lungs daily. Bedtime  . pantoprazole (PROTONIX) 40 MG tablet TAKE 1 TABLET (40 MG  TOTAL) BY MOUTH 2 (TWO) TIMES DAILY.  Marland Kitchen PARoxetine (PAXIL) 40 MG tablet Take 1 tablet (40 mg total) by mouth every morning.  . TRADJENTA 5 MG TABS tablet TAKE 1 TABLET BY MOUTH EVERY DAY  . triamcinolone cream (KENALOG) 0.1 % Apply 1 application topically 3 (three) times daily. (Patient taking differently: Apply 1 application topically 3 (three) times daily as needed. )   No facility-administered encounter medications on file as of 03/20/2017.     Activities of Daily Living In your present state of health, do you have any difficulty performing the following activities: 03/20/2017 02/27/2017  Hearing? N -  Vision? N -  Difficulty concentrating or making decisions? N -  Walking or climbing stairs? N -  Dressing or bathing? N -  Doing errands, shopping? N N  Preparing Food and eating ? N -  Using the Toilet? N -  In the past six months, have you accidently leaked urine? N -  Do you have problems with loss of bowel control? Y -  Managing your Medications? N -  Managing your Finances? N -  Housekeeping or managing your Housekeeping? Y -  Some recent data might be hidden    Patient Care Team: [name], MD as PCP - [facility], Marga Melnick, MD (Gastroenterology) Irine Seal, MD as Attending Physician (Urology) Kathrynn Ducking, MD (Neurology) Franchot Gallo, MD as Consulting Physician (Urology) Rutherford Guys, MD as Consulting Physician (Ophthalmology)    Assessment:    Exercise Activities and Dietary recommendations Current Exercise Habits: Home exercise routine, Type of exercise: Other - see comments (currently participating in physical therapy twice a week), Time (Minutes): 30, Frequency (Times/Week): 2, Weekly Exercise (Minutes/Week): 60, Intensity: Mild  Goals    . Blood Pressure < 140/90    . HEMOGLOBIN A1C < 7.0    . LDL CALC < 100      Fall Risk Fall Risk  03/20/2017 02/12/2017 07/06/2016 04/03/2016 01/19/2015  Falls in the past year? Yes No No Yes No  Number  falls in past yr: 2 or more - - 2 or more -  Injury with Fall? No - - Yes -  Comment due to weakness and dehydration after her colonoscopy - - - -  Risk Factor Category  - - - High Fall Risk -  Risk for fall due to : Impaired balance/gait - - - -  Follow up Falls evaluation completed;Education provided;Falls prevention discussed - - - -   Depression Screen PHQ 2/9 Scores 03/20/2017 02/12/2017 07/06/2016 07/06/2016  PHQ - 2 Score 1 0 0 0  PHQ- 9 Score 6 - - -     Cognitive Function: Normal   6CIT Screen 03/20/2017  What Year? 0 points  What month? 0 points  What time? 0 points  Count back from 20 0 points  Months in reverse 0 points  Repeat phrase 0 points  Total Score 0    Immunization History  Administered Date(s) Administered  . Influenza Split 03/17/2015  . Influenza Whole 04/15/2007, 03/17/2009  . Influenza,inj,Quad PF,6+ Mos 02/04/2014, 02/12/2017  . Pneumococcal Conjugate-13 12/21/2013  . Pneumococcal Polysaccharide-23 01/19/2004, 05/23/2010  . Td 06/07/2003  . Tdap 06/14/2011  . Zoster 02/02/2008   Screening Tests Health Maintenance  Topic Date Due  . OPHTHALMOLOGY EXAM  07/26/2016  . HEMOGLOBIN A1C  08/18/2017  . URINE MICROALBUMIN  10/31/2017  . FOOT EXAM  03/18/2018  . MAMMOGRAM  04/20/2018  . COLONOSCOPY  02/27/2019  . TETANUS/TDAP  06/13/2021  . INFLUENZA VACCINE  Completed  . DEXA SCAN  Completed  . Hepatitis C Screening  Completed  . PNA vac Low Risk Adult  Completed      Plan:   I have personally reviewed and noted the following in the patient's chart:   . Medical and social history . Use of alcohol, tobacco or illicit drugs  . Current medications and supplements . Functional ability and status . Nutritional status . Physical activity . Advanced directives . List of other physicians . Hospitalizations, surgeries, and ER visits in previous 12 months . Vitals . Screenings to include cognitive, depression, and falls . Referrals and  appointments  In addition, I have reviewed and discussed with patient certain preventive protocols, quality metrics, and best practice recommendations. A written personalized care plan for preventive services as well as general preventive health recommendations were provided to patient.     Stormy Fabian, LPN  51/07/5850

## 2017-03-20 NOTE — Patient Instructions (Addendum)
[name] , Thank you for taking time to come for your Medicare Wellness Visit. I appreciate your ongoing commitment to your health goals. Please review the following plan we discussed and let me know if I can assist you in the future.   Screening recommendations/referrals: Colonoscopy: Up to date, next due 02/2019 Mammogram: Up to date, due 04/2017 Bone Density: Up to date Diabetic Eye Exam: Due, scheduled for 04/17/2017 with Dr. Gershon Crane Recommended yearly dental visit for hygiene and checkup  Vaccinations: Influenza vaccine: Up to date Pneumococcal vaccine: Up to date Tdap vaccine: Up to date, next due 06/2021 Shingles vaccine: Completed     Advanced directives: Advance directive discussed with you today. Once this is complete please bring a copy in to our office so we can scan it into your chart.  Conditions/risks identified: Obese, recommend increasing your exercise program at least 3 days a week for 30-45 minutes at a time as tolerated.   Next appointment: Follow up with Dr. Moshe Cipro on 1/72/2018 at 2:40 pm. Follow up in 1 year for your annual wellness visit.   Preventive Care 72 Years and Older, Female Preventive care refers to lifestyle choices and visits with your health care provider that can promote health and wellness. What does preventive care include?  A yearly physical exam. This is also called an annual well check.  Dental exams once or twice a year.  Routine eye exams. Ask your health care provider how often you should have your eyes checked.  Personal lifestyle choices, including:  Daily care of your teeth and gums.  Regular physical activity.  Eating a healthy diet.  Avoiding tobacco and drug use.  Limiting alcohol use.  Practicing safe sex.  Taking low-dose aspirin every day.  Taking vitamin and mineral supplements as recommended by your health care provider. What happens during an annual well check? The services and screenings done by your health  care provider during your annual well check will depend on your age, overall health, lifestyle risk factors, and family history of disease. Counseling  Your health care provider may ask you questions about your:  Alcohol use.  Tobacco use.  Drug use.  Emotional well-being.  Home and relationship well-being.  Sexual activity.  Eating habits.  History of falls.  Memory and ability to understand (cognition).  Work and work Statistician.  Reproductive health. Screening  You may have the following tests or measurements:  Height, weight, and BMI.  Blood pressure.  Lipid and cholesterol levels. These may be checked every 5 years, or more frequently if you are over 80 years old.  Skin check.  Lung cancer screening. You may have this screening every year starting at age 49 if you have a 30-pack-year history of smoking and currently smoke or have quit within the past 15 years.  Fecal occult blood test (FOBT) of the stool. You may have this test every year starting at age 36.  Flexible sigmoidoscopy or colonoscopy. You may have a sigmoidoscopy every 5 years or a colonoscopy every 10 years starting at age 75.  Hepatitis C blood test.  Hepatitis B blood test.  Sexually transmitted disease (STD) testing.  Diabetes screening. This is done by checking your blood sugar (glucose) after you have not eaten for a while (fasting). You may have this done every 1-3 years.  Bone density scan. This is done to screen for osteoporosis. You may have this done starting at age 31.  Mammogram. This may be done every 1-2 years. Talk to your  health care provider about how often you should have regular mammograms. Talk with your health care provider about your test results, treatment options, and if necessary, the need for more tests. Vaccines  Your health care provider may recommend certain vaccines, such as:  Influenza vaccine. This is recommended every year.  Tetanus, diphtheria, and  acellular pertussis (Tdap, Td) vaccine. You may need a Td booster every 10 years.  Zoster vaccine. You may need this after age 22.  Pneumococcal 13-valent conjugate (PCV13) vaccine. One dose is recommended after age 72.  Pneumococcal polysaccharide (PPSV23) vaccine. One dose is recommended after age 67. Talk to your health care provider about which screenings and vaccines you need and how often you need them. This information is not intended to replace advice given to you by your health care provider. Make sure you discuss any questions you have with your health care provider. Document Released: 06/24/2015 Document Revised: 02/15/2016 Document Reviewed: 03/29/2015 Elsevier Interactive Patient Education  2017 [employer] Prevention in the Home Falls can cause injuries. They can happen to people of all ages. There are many things you can do to make your home safe and to help prevent falls. What can I do on the outside of my home?  Regularly fix the edges of walkways and driveways and fix any cracks.  Remove anything that might make you trip as you walk through a door, such as a raised step or threshold.  Trim any bushes or trees on the path to your home.  Use bright outdoor lighting.  Clear any walking paths of anything that might make someone trip, such as rocks or tools.  Regularly check to see if handrails are loose or broken. Make sure that both sides of any steps have handrails.  Any raised decks and porches should have guardrails on the edges.  Have any leaves, snow, or ice cleared regularly.  Use sand or salt on walking paths during winter.  Clean up any spills in your garage right away. This includes oil or grease spills. What can I do in the bathroom?  Use night lights.  Install grab bars by the toilet and in the tub and shower. Do not use towel bars as grab bars.  Use non-skid mats or decals in the tub or shower.  If you need to sit down in the shower, use  a plastic, non-slip stool.  Keep the floor dry. Clean up any water that spills on the floor as soon as it happens.  Remove soap buildup in the tub or shower regularly.  Attach bath mats securely with double-sided non-slip rug tape.  Do not have throw rugs and other things on the floor that can make you trip. What can I do in the bedroom?  Use night lights.  Make sure that you have a light by your bed that is easy to reach.  Do not use any sheets or blankets that are too big for your bed. They should not hang down onto the floor.  Have a firm chair that has side arms. You can use this for support while you get dressed.  Do not have throw rugs and other things on the floor that can make you trip. What can I do in the kitchen?  Clean up any spills right away.  Avoid walking on wet floors.  Keep items that you use a lot in easy-to-reach places.  If you need to reach something above you, use a strong step stool that has a  grab bar.  Keep electrical cords out of the way.  Do not use floor polish or wax that makes floors slippery. If you must use wax, use non-skid floor wax.  Do not have throw rugs and other things on the floor that can make you trip. What can I do with my stairs?  Do not leave any items on the stairs.  Make sure that there are handrails on both sides of the stairs and use them. Fix handrails that are broken or loose. Make sure that handrails are as long as the stairways.  Check any carpeting to make sure that it is firmly attached to the stairs. Fix any carpet that is loose or worn.  Avoid having throw rugs at the top or bottom of the stairs. If you do have throw rugs, attach them to the floor with carpet tape.  Make sure that you have a light switch at the top of the stairs and the bottom of the stairs. If you do not have them, ask someone to add them for you. What else can I do to help prevent falls?  Wear shoes that:  Do not have high heels.  Have  rubber bottoms.  Are comfortable and fit you well.  Are closed at the toe. Do not wear sandals.  If you use a stepladder:  Make sure that it is fully opened. Do not climb a closed stepladder.  Make sure that both sides of the stepladder are locked into place.  Ask someone to hold it for you, if possible.  Clearly mark and make sure that you can see:  Any grab bars or handrails.  First and last steps.  Where the edge of each step is.  Use tools that help you move around (mobility aids) if they are needed. These include:  Canes.  Walkers.  Scooters.  Crutches.  Turn on the lights when you go into a dark area. Replace any light bulbs as soon as they burn out.  Set up your furniture so you have a clear path. Avoid moving your furniture around.  If any of your floors are uneven, fix them.  If there are any pets around you, be aware of where they are.  Review your medicines with your doctor. Some medicines can make you feel dizzy. This can increase your chance of falling. Ask your doctor what other things that you can do to help prevent falls. This information is not intended to replace advice given to you by your health care provider. Make sure you discuss any questions you have with your health care provider. Document Released: 03/24/2009 Document Revised: 11/03/2015 Document Reviewed: 07/02/2014 Elsevier Interactive Patient Education  2017 [employer].

## 2017-03-22 ENCOUNTER — Telehealth: Payer: Self-pay | Admitting: Family Medicine

## 2017-03-22 NOTE — Telephone Encounter (Signed)
Tressia Danas (physical therapist) calling to get order on one more visits.  Due to the weather the patient was not seen for discharge visit.  Need verbal order.  Please call  [phone]

## 2017-03-23 ENCOUNTER — Encounter: Payer: Self-pay | Admitting: Family Medicine

## 2017-03-23 NOTE — Assessment & Plan Note (Signed)
Controlled, no change in medication DASH diet and commitment to daily physical activity for a minimum of 30 minutes discussed and encouraged, as a part of hypertension management. The importance of attaining a healthy weight is also discussed.  BP/Weight 03/20/2017 03/18/2017 03/15/2017 03/11/2017 02/28/2017 02/27/2017 09/04/7122  Systolic BP 580 998 338 250 539 - -  Diastolic BP 70 78 74 72 68 - -  Wt. (Lbs) 203.08 204 202.5 204.2 - 222.66 -  BMI 32.78 32.93 32.68 32.96 - - 36.49

## 2017-03-23 NOTE — Assessment & Plan Note (Signed)
Deteriorated. Patient re-educated about  the importance of commitment to a  minimum of 150 minutes of exercise per week.  The importance of healthy food choices with portion control discussed. Encouraged to start a food diary, count calories and to consider  joining a support group. Sample diet sheets offered. Goals set by the patient for the next several months.   Weight /BMI 03/20/2017 03/18/2017 03/15/2017  WEIGHT 203 lb 1.3 oz 204 lb 202 lb 8 oz  HEIGHT 5\' 6"  5\' 6"  5\' 6"   BMI 32.78 kg/m2 32.93 kg/m2 32.68 kg/m2

## 2017-03-23 NOTE — Progress Notes (Signed)
Cheryl Abbott     MRN: [id number]      DOB: 1945-12-11   HPI Cheryl Abbott is here for follow up and re-evaluation of chronic medical conditions, medication management and review of any available recent lab and radiology data.  Preventive health is updated, specifically  Cancer screening and Immunization.   Questions or concerns regarding consultations or procedures which the PT has had in the interim are  addressed. The PT denies any adverse reactions to current medications since the last visit.  There are no new concerns.  There are no specific complaints   ROS Denies recent fever or chills. Denies sinus pressure, nasal congestion, ear pain or sore throat. Denies chest congestion, productive cough or wheezing. Denies chest pains, palpitations and leg swelling Denies abdominal pain, nausea, vomiting,diarrhea or constipation.   Denies dysuria, frequency, hesitancy or incontinence. Denies joint pain, swelling and limitation in mobility. Denies headaches, seizures, numbness, or tingling. Denies depression, anxiety or insomnia. Denies skin break down or rash.   PE  BP 126/78 (BP Location: Left Arm, Patient Position: Sitting, Cuff Size: Large)   Pulse 92   Temp 98 F (36.7 C) (Temporal)   Resp 16   Ht _0  (1.676 m)   Wt 204 lb (92.5 kg)   SpO2 96%   BMI 32.93 kg/m   Patient alert and oriented and in no cardiopulmonary distress.  HEENT: No facial asymmetry, EOMI,   oropharynx pink and moist.  Neck supple no JVD, no mass.  Chest: Clear to auscultation bilaterally.  CVS: S1, S2 no murmurs, no S3.Regular rate.  ABD: Soft non tender.   Ext: No edema  MS: Adequate ROM spine, shoulders, hips and knees.  Skin: Intact, no ulcerations or rash noted.  Psych: Good eye contact, normal affect. Memory intact not anxious or depressed appearing.  CNS: CN 2-12 intact, power,  normal throughout.no focal deficits noted.   Assessment & Plan  Hypertension goal BP (blood  pressure) < 130/80 Controlled, no change in medication DASH diet and commitment to daily physical activity for a minimum of 30 minutes discussed and encouraged, as a part of hypertension management. The importance of attaining a healthy weight is also discussed.  BP/Weight 03/20/2017 03/18/2017 03/15/2017 03/11/2017 02/28/2017 02/27/2017 9/93/5701  Systolic BP 779 390 300 923 300 - -  Diastolic BP 70 78 74 72 68 - -  Wt. (Lbs) 203.08 204 202.5 204.2 - 222.66 -  BMI 32.78 32.93 32.68 32.96 - - 36.49       Type 2 diabetes with nephropathy (HCC) Controlled, no change in medication Cheryl Abbott is reminded of the importance of commitment to daily physical activity for 30 minutes or more, as able and the need to limit carbohydrate intake to 30 to 60 grams per meal to help with blood sugar control.   The need to take medication as prescribed, test blood sugar as directed, and to call between visits if there is a concern that blood sugar is uncontrolled is also discussed.   Cheryl Abbott is reminded of the importance of daily foot exam, annual eye examination, and good blood sugar, blood pressure and cholesterol control.  Diabetic Labs Latest Ref Rng & Units 02/28/2017 02/27/2017 02/26/2017 02/19/2017 02/18/2017  HbA1c <5.7 % of total Hgb - - - - 7.5(H)  Microalbumin <2.0 mg/dL - - - - -  Micro/Creat Ratio 0.0 - 30.0 mg/g - - - - -  Chol <200 mg/dL - - - - 155  HDL >50 mg/dL - - - -  40(L)  Calc LDL <100 mg/dL - - - - -  Triglycerides <150 mg/dL - - - - 141  Creatinine 0.44 - 1.00 mg/dL 1.27(H) 1.42(H) 1.58(H) 1.64(H) 1.45(H)   BP/Weight 03/20/2017 03/18/2017 03/15/2017 03/11/2017 02/28/2017 02/27/2017 08/02/7979  Systolic BP 025 486 282 417 530 - -  Diastolic BP 70 78 74 72 68 - -  Wt. (Lbs) 203.08 204 202.5 204.2 - 222.66 -  BMI 32.78 32.93 32.68 32.96 - - 36.49   Foot/eye exam completion dates Latest Ref Rng & Units 03/18/2017 02/24/2016  Eye Exam No Retinopathy - -  Foot exam Order - - -  Foot Form  Completion - Done Done        Generalized anxiety disorder Controlled, no change in medication   Hyperlipidemia LDL goal <100 Hyperlipidemia:Low fat diet discussed and encouraged.   Lipid Panel  Lab Results  Component Value Date   CHOL 155 02/18/2017   HDL 40 (L) 02/18/2017   LDLCALC 99 10/11/2016   TRIG 141 02/18/2017   CHOLHDL 3.9 02/18/2017       Morbid obesity ([place]) Deteriorated. Patient re-educated about  the importance of commitment to a  minimum of 150 minutes of exercise per week.  The importance of healthy food choices with portion control discussed. Encouraged to start a food diary, count calories and to consider  joining a support group. Sample diet sheets offered. Goals set by the patient for the next several months.   Weight /BMI 03/20/2017 03/18/2017 03/15/2017  WEIGHT 203 lb 1.3 oz 204 lb 202 lb 8 oz  HEIGHT _0  _1  _2   BMI 32.78 kg/m2 32.93 kg/m2 32.68 kg/m2      Multiple myeloma (HCC) Will start definitive treatment in November 2018, new diagnosis

## 2017-03-23 NOTE — Assessment & Plan Note (Signed)
Hyperlipidemia:Low fat diet discussed and encouraged.   Lipid Panel  Lab Results  Component Value Date   CHOL 155 02/18/2017   HDL 40 (L) 02/18/2017   LDLCALC 99 10/11/2016   TRIG 141 02/18/2017   CHOLHDL 3.9 02/18/2017

## 2017-03-23 NOTE — Assessment & Plan Note (Signed)
Controlled, no change in medication Cheryl Abbott is reminded of the importance of commitment to daily physical activity for 30 minutes or more, as able and the need to limit carbohydrate intake to 30 to 60 grams per meal to help with blood sugar control.   The need to take medication as prescribed, test blood sugar as directed, and to call between visits if there is a concern that blood sugar is uncontrolled is also discussed.   Cheryl Abbott is reminded of the importance of daily foot exam, annual eye examination, and good blood sugar, blood pressure and cholesterol control.  Diabetic Labs Latest Ref Rng & Units 02/28/2017 02/27/2017 02/26/2017 02/19/2017 02/18/2017  HbA1c <5.7 % of total Hgb - - - - 7.5(H)  Microalbumin <2.0 mg/dL - - - - -  Micro/Creat Ratio 0.0 - 30.0 mg/g - - - - -  Chol <200 mg/dL - - - - 155  HDL >50 mg/dL - - - - 40(L)  Calc LDL <100 mg/dL - - - - -  Triglycerides <150 mg/dL - - - - 141  Creatinine 0.44 - 1.00 mg/dL 1.27(H) 1.42(H) 1.58(H) 1.64(H) 1.45(H)   BP/Weight 03/20/2017 03/18/2017 03/15/2017 03/11/2017 02/28/2017 02/27/2017 1/63/8453  Systolic BP 646 803 212 248 250 - -  Diastolic BP 70 78 74 72 68 - -  Wt. (Lbs) 203.08 204 202.5 204.2 - 222.66 -  BMI 32.78 32.93 32.68 32.96 - - 36.49   Foot/eye exam completion dates Latest Ref Rng & Units 03/18/2017 02/24/2016  Eye Exam No Retinopathy - -  Foot exam Order - - -  Foot Form Completion - Done Done

## 2017-03-23 NOTE — Assessment & Plan Note (Signed)
Controlled, no change in medication  

## 2017-03-23 NOTE — Assessment & Plan Note (Signed)
Will start definitive treatment in November 2018, new diagnosis

## 2017-03-25 DIAGNOSIS — E785 Hyperlipidemia, unspecified: Secondary | ICD-10-CM | POA: Diagnosis not present

## 2017-03-25 DIAGNOSIS — G4733 Obstructive sleep apnea (adult) (pediatric): Secondary | ICD-10-CM | POA: Diagnosis not present

## 2017-03-25 DIAGNOSIS — E1121 Type 2 diabetes mellitus with diabetic nephropathy: Secondary | ICD-10-CM | POA: Diagnosis not present

## 2017-03-25 DIAGNOSIS — M6281 Muscle weakness (generalized): Secondary | ICD-10-CM | POA: Diagnosis not present

## 2017-03-25 DIAGNOSIS — D649 Anemia, unspecified: Secondary | ICD-10-CM | POA: Diagnosis not present

## 2017-03-25 DIAGNOSIS — E1122 Type 2 diabetes mellitus with diabetic chronic kidney disease: Secondary | ICD-10-CM | POA: Diagnosis not present

## 2017-03-25 DIAGNOSIS — M1991 Primary osteoarthritis, unspecified site: Secondary | ICD-10-CM | POA: Diagnosis not present

## 2017-03-25 DIAGNOSIS — R6510 Systemic inflammatory response syndrome (SIRS) of non-infectious origin without acute organ dysfunction: Secondary | ICD-10-CM | POA: Diagnosis not present

## 2017-03-25 DIAGNOSIS — N183 Chronic kidney disease, stage 3 (moderate): Secondary | ICD-10-CM | POA: Diagnosis not present

## 2017-03-25 DIAGNOSIS — I129 Hypertensive chronic kidney disease with stage 1 through stage 4 chronic kidney disease, or unspecified chronic kidney disease: Secondary | ICD-10-CM | POA: Diagnosis not present

## 2017-03-25 NOTE — Telephone Encounter (Signed)
Verbal order provided for one additional visit

## 2017-03-31 ENCOUNTER — Encounter (HOSPITAL_COMMUNITY): Payer: Self-pay | Admitting: Emergency Medicine

## 2017-03-31 ENCOUNTER — Emergency Department (HOSPITAL_COMMUNITY): Payer: Medicare Other

## 2017-03-31 ENCOUNTER — Emergency Department (HOSPITAL_COMMUNITY)
Admission: EM | Admit: 2017-03-31 | Discharge: 2017-03-31 | Disposition: A | Discharge status: Home / Self Care | Payer: Medicare Other | Attending: Emergency Medicine | Admitting: Emergency Medicine

## 2017-03-31 DIAGNOSIS — Y999 Unspecified external cause status: Secondary | ICD-10-CM | POA: Insufficient documentation

## 2017-03-31 DIAGNOSIS — W101XXA Fall (on)(from) sidewalk curb, initial encounter: Secondary | ICD-10-CM | POA: Insufficient documentation

## 2017-03-31 DIAGNOSIS — S79912A Unspecified injury of left hip, initial encounter: Secondary | ICD-10-CM | POA: Diagnosis not present

## 2017-03-31 DIAGNOSIS — Z8673 Personal history of transient ischemic attack (TIA), and cerebral infarction without residual deficits: Secondary | ICD-10-CM | POA: Diagnosis not present

## 2017-03-31 DIAGNOSIS — Z79899 Other long term (current) drug therapy: Secondary | ICD-10-CM | POA: Insufficient documentation

## 2017-03-31 DIAGNOSIS — Y939 Activity, unspecified: Secondary | ICD-10-CM | POA: Diagnosis not present

## 2017-03-31 DIAGNOSIS — Y92238 Other place in hospital as the place of occurrence of the external cause: Secondary | ICD-10-CM | POA: Diagnosis not present

## 2017-03-31 DIAGNOSIS — N183 Chronic kidney disease, stage 3 (moderate): Secondary | ICD-10-CM | POA: Insufficient documentation

## 2017-03-31 DIAGNOSIS — E1140 Type 2 diabetes mellitus with diabetic neuropathy, unspecified: Secondary | ICD-10-CM | POA: Insufficient documentation

## 2017-03-31 DIAGNOSIS — Z7901 Long term (current) use of anticoagulants: Secondary | ICD-10-CM | POA: Diagnosis not present

## 2017-03-31 DIAGNOSIS — I129 Hypertensive chronic kidney disease with stage 1 through stage 4 chronic kidney disease, or unspecified chronic kidney disease: Secondary | ICD-10-CM | POA: Insufficient documentation

## 2017-03-31 DIAGNOSIS — Z87891 Personal history of nicotine dependence: Secondary | ICD-10-CM | POA: Insufficient documentation

## 2017-03-31 DIAGNOSIS — W19XXXA Unspecified fall, initial encounter: Secondary | ICD-10-CM

## 2017-03-31 DIAGNOSIS — S79911A Unspecified injury of right hip, initial encounter: Secondary | ICD-10-CM | POA: Diagnosis not present

## 2017-03-31 DIAGNOSIS — S300XXA Contusion of lower back and pelvis, initial encounter: Secondary | ICD-10-CM | POA: Diagnosis not present

## 2017-03-31 DIAGNOSIS — E1122 Type 2 diabetes mellitus with diabetic chronic kidney disease: Secondary | ICD-10-CM | POA: Diagnosis not present

## 2017-03-31 DIAGNOSIS — S3992XA Unspecified injury of lower back, initial encounter: Secondary | ICD-10-CM | POA: Diagnosis present

## 2017-03-31 HISTORY — DX: Malignant (primary) neoplasm, unspecified: C80.1

## 2017-03-31 MED ORDER — TRAMADOL HCL 50 MG PO TABS
50.0000 mg | ORAL_TABLET | Freq: Once | ORAL | Status: AC
  Administered 2017-03-31: 50 mg via ORAL
  Filled 2017-03-31: qty 1

## 2017-03-31 MED ORDER — TRAMADOL HCL 50 MG PO TABS: 50.0000 mg | ORAL_TABLET | Freq: Four times a day (QID) | ORAL | 0 refills | Status: DC | PRN

## 2017-03-31 MED ORDER — ACETAMINOPHEN 325 MG PO TABS
650.0000 mg | ORAL_TABLET | Freq: Once | ORAL | Status: AC
  Administered 2017-03-31: 650 mg via ORAL

## 2017-03-31 NOTE — ED Provider Notes (Signed)
Medical screening examination/treatment/procedure(s) were conducted as a shared visit with non-physician practitioner(s) and myself.  I personally evaluated the patient during the encounter.  Mechanical fall earlier today while trying to step up on a curb and she landed on her buttocks and at the same time try to catch herself with her arms.  At this time patient is pain-free has been able to ambulate.  She still having some pain in her bottom and in her bilateral lower arms.  No tenderness to those areas on my exam.  X-rays were done and negative.  Low suspicion for syncope or other causes for fall.  No evidence of stroke.  Patient stable for discharge with supportive care at this time.     Merrily Pew, MD 04/08/17 1041

## 2017-03-31 NOTE — ED Notes (Signed)
Pt reports a fall on this property at short stay (?)- reports here to see church friend in pt Was looking for a valet parking and fell (unwitnessed) and had to get up herself because noone saw here and she saw noone to help her.   She complains of pain to her low back and buttocks area - then speaks of her High hernia that is getting bigger and bigger- and speaks of Dr Moshe Cipro who is her PCP who knows her history including removal of polyps recently as well as her high hernia

## 2017-03-31 NOTE — ED Provider Notes (Signed)
[facility] EMERGENCY DEPARTMENT Provider Note   CSN: [id number] Arrival date & time: 03/31/17  1511     History   Chief Complaint Chief Complaint  Patient presents with  . Fall    HPI Cheryl Abbott is a 71 y.o. female.  HPI   Cheryl Abbott is a 71 y.o. female who presents to the Emergency Department complaining of pain to her lower back and hips secondary to a mechanical fall that occurred shortly before ER arrival.  States that she was here at the hospital visiting someone and tripped on the curb.  Landed on her buttocks and toward her left hip.  Complains of "soreness" to her lower back and hips with is associated with movement and walking. initially had pain to her wrists, but states that pain has improved since arrival.  She denies LOC, neck pain, head injury, dizziness or headache.  Also denies numbness or symptoms prior to the fall.    Past Medical History:  Diagnosis Date  . Anemia   . Anxiety   . Anxiety disorder   . Arthritis   . Cancer ([place])   . 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 Chronic back pain   . Depression   . Fatty liver   . GERD (gastroesophageal reflux disease)   . Headache   . History of adenomatous polyp of colon    2008  . History of kidney stones   . Hypercalcemia   . Hyperlipidemia   . Hypertension   . Left ureteral stone   . Lipoma of arm 01/19/2015  . Lumbar disc disease with radiculopathy   . Nephrolithiasis   . Neuropathy, peripheral   . OSA treated with BiPAP    per study 2007  . Sciatica   . Sigmoid diverticulosis   . Type 2 diabetes mellitus [facility])     Patient Active Problem List   Diagnosis Date Noted  . Multiple myeloma ([place]) 03/15/2017  . Urinary frequency 02/28/2017  . SIRS (systemic inflammatory response syndrome) ([place]) 02/26/2017  . 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 Morbid obesity ([place]) 10/14/2016  . Vaginal dryness, menopausal 10/14/2016  . Neuropathic pain of both feet 04/11/2016  . TIA  (transient ischemic attack) 02/26/2016  . Lactose intolerance in adult 02/01/2016  . Brain TIA 08/19/2015  . CKD (chronic kidney disease) stage 3, GFR 30-59 ml/min (HCC) 07/12/2015  . Lipoma of arm 01/19/2015  . Anemia 10/14/2014  . Lumbar back pain with radiculopathy affecting left lower extremity 05/11/2014  . Nocturnal hypoxia 02/04/2014  . Generalized anxiety disorder 05/20/2013  . Anxiety and depression 08/13/2012  . Abnormal brain scan 02/07/2012  . Thoracic or lumbosacral neuritis or radiculitis, unspecified 12/22/2010  . Hypercalcemia 11/30/2010  . Colon adenomas 05/11/2009  . HEMORRHOIDS 03/15/2009  . GERD 03/15/2009  . FATTY LIVER DISEASE 03/15/2009  . RENAL CALCULUS 03/15/2009  . Sleep apnea 09/15/2008  . 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 Hyperlipidemia LDL goal <100 09/17/2007  . Hypertension goal BP (blood pressure) < 130/80 09/17/2007    Past Surgical History:  Procedure Laterality Date  . BIOPSY N/A 03/17/2013   Procedure: GASTRIC BIOPSIES;  Surgeon: Danie Binder, MD;  Location: [facility];  Service: Endoscopy;  Laterality: N/A;  . BIOPSY  06/10/2015   Procedure: BIOPSY;  Surgeon: Danie Binder, MD;  Location[facility];  Service: Endoscopy;;  gastric biopsy  . CARDIAC CATHETERIZATION  05-11-2003  dr Shelva Majestic ([facility])   Abnormal cardiolite/  Normal coronary arteries and normal LVF,  ef  63%  . CARDIOVASCULAR STRESS TEST  01-01-2014   normal lexiscan cardiolite/  no ischemia/ infarct/  normal LV function and wall motion ,  ef 81%  . COLONOSCOPY  last one 10/02/2011   MOD [place] TICS, IH, NEXT TCS APR 2018  . COLONOSCOPY WITH PROPOFOL N/A 02/26/2017   Procedure: COLONOSCOPY WITH PROPOFOL;  Surgeon: Danie Binder, MD;  Location: [facility];  Service: Endoscopy;  Laterality: N/A;  11:00am  . CYSTO/   RIGHT URETEROSOCOPY LASER LITHOTRIPSY STONE EXTRACTION  10-13-2004  . CYSTO/  RIGHT RETROGRADE PYELOGRAM/  PLACMENT RIGHT  URETERAL STENT  01-10-2010  . CYSTOSCOPY W/ URETERAL STENT PLACEMENT Right 08/19/2015   Procedure: CYSTOSCOPY WITH RETROGRADE PYELOGRAM/URETERAL STENT PLACEMENT;  Surgeon: Franchot Gallo, MD;  Location: [facility];  Service: Urology;  Laterality: Right;  . CYSTOSCOPY WITH RETROGRADE PYELOGRAM, URETEROSCOPY AND STENT PLACEMENT Left 10/21/2014   Procedure: CYSTOSCOPY WITH RETROGRADE PYELOGRAM, URETEROSCOPY AND STENT PLACEMENT;  Surgeon: Franchot Gallo, MD;  Location[facility];  Service: Urology;  Laterality: Left;  . CYSTOSCOPY WITH RETROGRADE PYELOGRAM, URETEROSCOPY AND STENT PLACEMENT Right 11/22/2015   Procedure: CYSTOSCOPY, RIGHT URETERAL STENT REMOVAL; RIGHT RETROGRADE PYELOGRAM, RIGHT URETEROSCOPY WITH BALLOON DILATION; RIGHT URETERAL STENT PLACEMENT;  Surgeon: Franchot Gallo, MD;  Location: [facility];  Service: Urology;  Laterality: Right;  . CYSTOSCOPY/RETROGRADE/URETEROSCOPY/STONE EXTRACTION WITH BASKET Bilateral 08/02/2015   Procedure: CYSTOSCOPY; BILATERAL RETROGRADE PYELOGRAMS; BILATERAL URETEROSCOPY, STONE EXTRACTION WITH BASKET;  Surgeon: Franchot Gallo, MD;  Location: [facility];  Service: Urology;  Laterality: Bilateral;  . CYSTOSCOPY/RETROGRADE/URETEROSCOPY/STONE EXTRACTION WITH BASKET Right 06/28/2015   Procedure: CYSTOSCOPY/RETROGRADE/URETEROSCOPY/STONE EXTRACTION WITH BASKET, RIGHT URETERAL DOUBLE J STENT PLACEMENT;  Surgeon: Franchot Gallo, MD;  Location: [facility];  Service: Urology;  Laterality: Right;  . ESOPHAGOGASTRODUODENOSCOPY (EGD) WITH PROPOFOL N/A 03/17/2013   Procedure: ESOPHAGOGASTRODUODENOSCOPY (EGD) WITH PROPOFOL;  Surgeon: Danie Binder, MD;  Location: [facility];  Service: Endoscopy;  Laterality: N/A;  . ESOPHAGOGASTRODUODENOSCOPY (EGD) WITH PROPOFOL N/A 06/10/2015   Distal gastritis, distal esophageal stricture s/p dilation  . ESOPHAGOGASTRODUODENOSCOPY (EGD) WITH PROPOFOL N/A 02/26/2017   Procedure: ESOPHAGOGASTRODUODENOSCOPY (EGD) WITH PROPOFOL;   Surgeon: Danie Binder, MD;  [facility];  Service: Endoscopy;  Laterality: N/A;  . FLEXIBLE SIGMOIDOSCOPY  09/11/2011   [id number] Internal hemorrhoids  . HOLMIUM LASER APPLICATION Left 3/35/4562   Procedure: HOLMIUM LASER APPLICATION;  Surgeon: Franchot Gallo, MD;  Location[facility];  Service: Urology;  Laterality: Left;  . HOLMIUM LASER APPLICATION Right 5/63/8937   Procedure: HOLMIUM LASER APPLICATION;  Surgeon: Franchot Gallo, MD;  Location: [facility];  Service: Urology;  Laterality: Right;  . MASS EXCISION Left 03/04/2015   Procedure: EXCISION OF SOFT TISSUE NEOPLASM LEFT ARM;  Surgeon: Aviva Signs, MD;  Location: [facility];  Service: General;  Laterality: Left;  . PERCUTANEOUS NEPHROSTOLITHOTOMY Bilateral 12/  2011     [facility]  . POLYPECTOMY N/A 03/17/2013   Procedure: GASTRIC POLYPECTOMY;  Surgeon: Danie Binder, MD;  Location: [facility];  Service: Endoscopy;  Laterality: N/A;  . POLYPECTOMY  02/26/2017   Procedure: POLYPECTOMY;  Surgeon: Danie Binder, MD;  [facility];  Service: Endoscopy;;  polyp at cecum, ascending colon polyps x3, hepatic flexure polyps x8, transverse colon polyps x8   . REMOVAL RIGHT THIGH CYST  2006  . SAVORY DILATION N/A 03/17/2013   Procedure: SAVORY DILATION;  Surgeon: Danie Binder, MD;  Location: [facility];  Service: Endoscopy;  Laterality:  N/A;  #12.8, 14, 15, 16 dilators used  . SAVORY DILATION N/A 06/10/2015   Procedure: SAVORY DILATION;  Surgeon: Danie Binder, MD;  [facility];  Service: Endoscopy;  Laterality: N/A;  . TRANSTHORACIC ECHOCARDIOGRAM  01-01-2014   mild LVH/  ef 25-42%/  grade I diastolic dysfunction/  trivial MR, TR, and PR  . VAGINAL HYSTERECTOMY  1970's    OB History    Gravida Para Term Preterm AB Living   2 2 2     2    SAB TAB Ectopic Multiple Live Births                   Home Medications    Prior to Admission medications   Medication Sig Start Date End Date Taking?  Authorizing Provider  ALPRAZolam Duanne Moron) 0.5 MG tablet Take 1 tablet (0.5 mg total) by mouth 2 (two) times daily. 03/18/17   [name], MD  amLODipine (NORVASC) 10 MG tablet Take 1 tablet (10 mg total) by mouth daily. 03/18/17   [name], MD  aspirin EC 325 MG tablet Take 325 mg by mouth daily.    [provider]  Cholecalciferol (VITAMIN D3) 5000 units CAPS Take 1 capsule by mouth 2 (two) times daily.    [provider]  clopidogrel (PLAVIX) 75 MG tablet Take 75 mg by mouth daily.    [provider]  Cyanocobalamin (B-12 PO) Take 1 tablet by mouth daily.    [provider]  diphenhydramine-acetaminophen (TYLENOL PM) 25-500 MG TABS tablet Take 2 tablets by mouth at bedtime.    [provider]  docusate sodium (COLACE) 100 MG capsule Take 200 mg by mouth at bedtime.     [provider]  iron polysaccharides (NIFEREX) 150 MG capsule Take 150 mg by mouth daily.    [provider]  lovastatin (MEVACOR) 40 MG tablet TAKE 2 TABLETS (80 MG TOTAL) BY MOUTH AT BEDTIME. 01/14/17   [name], MD  OXYGEN Inhale into the lungs daily. Bedtime    [provider]  pantoprazole (PROTONIX) 40 MG tablet TAKE 1 TABLET (40 MG TOTAL) BY MOUTH 2 (TWO) TIMES DAILY. 03/01/17   [name], MD  PARoxetine (PAXIL) 40 MG tablet Take 1 tablet (40 mg total) by mouth every morning. 03/18/17   [name], MD  TRADJENTA 5 MG TABS tablet TAKE 1 TABLET BY MOUTH EVERY DAY 02/13/17   [name], MD  triamcinolone cream (KENALOG) 0.1 % Apply 1 application topically 3 (three) times daily. Patient taking differently: Apply 1 application topically 3 (three) times daily as needed.  12/11/16   Kem Parkinson, PA-C    Family History Family History  Problem Relation Age of Onset  . Hypertension Mother   . Diabetes Mother   . Heart failure Mother   . Dementia Mother   . Emphysema Father   . Hypertension Father   .  Diabetes Brother   . GER disease Brother   . Hypertension Sister   . Hypertension Sister   . Cancer Unknown        family history   . Diabetes Unknown        family history   . Heart defect Unknown        famiily history   . Arthritis Unknown        family history   . Anesthesia problems Neg Hx   . Hypotension Neg Hx   . Malignant hyperthermia Neg Hx   .  Pseudochol deficiency Neg Hx   . Colon cancer Neg Hx     Social History Social History  Substance Use Topics  . Smoking status: Former Smoker    Packs/day: 1.00    Years: 20.00    Types: Cigarettes    Start date: 09/21/1974    Quit date: 09/20/1988  . Smokeless tobacco: Never Used     Comment: Quit x 20 years  . Alcohol use No     Allergies   Ace inhibitors; Keflex [cephalexin]; Nitrofurantoin; and Penicillins   Review of Systems Review of Systems  Constitutional: Negative for chills and fever.  Eyes: Negative for visual disturbance.  Respiratory: Negative for chest tightness and shortness of breath.   Cardiovascular: Negative for chest pain.  Gastrointestinal: Negative for abdominal pain, nausea and vomiting.  Genitourinary: Negative for difficulty urinating and dysuria.  Musculoskeletal: Positive for arthralgias and back pain. Negative for joint swelling and neck pain.  Skin: Negative for color change and wound.  Neurological: Negative for dizziness, weakness, numbness and headaches.  All other systems reviewed and are negative.    Physical Exam Updated Vital Signs BP 117/83 (BP Location: Left Arm)   Pulse 95   Temp 98 F (36.7 C) (Oral)   Resp 18   Ht 5' 6"  (1.676 m)   Wt 91.6 kg (202 lb)   SpO2 98%   BMI 32.60 kg/m   Physical Exam  Constitutional: She is oriented to person, place, and time. She appears well-developed and well-nourished. No distress.  HENT:  Head: Atraumatic.  Mouth/Throat: Oropharynx is clear and moist.  Eyes: Pupils are equal, round, and reactive to light. EOM are normal.    Neck: Normal range of motion. Neck supple.  Cardiovascular: Normal rate, regular rhythm, normal heart sounds and intact distal pulses.   No murmur heard. Pulmonary/Chest: Effort normal and breath sounds normal. No respiratory distress. She exhibits no tenderness.  Abdominal: Soft. She exhibits no distension and no mass. There is no tenderness. There is no guarding.  Musculoskeletal: Normal range of motion. She exhibits tenderness.  Diffuse ttp of the lower bilateral lumber paraspinal muscles.  No bony deformities.  Pain on ROM of bilateral hips.  No shortening or external rotation  Lymphadenopathy:    She has no cervical adenopathy.  Neurological: She is alert and oriented to person, place, and time. She has normal strength. No sensory deficit. Gait normal. GCS eye subscore is 4. GCS verbal subscore is 5. GCS motor subscore is 6.  CN III-XII intact, no pronator drift, speech clear.  5/5 motor strength of bilat upper and lower extremities.  Skin: Skin is warm. Capillary refill takes less than 2 seconds.  Psychiatric: She has a normal mood and affect. Thought content normal.  Nursing note and vitals reviewed.    ED Treatments / Results  Labs (all labs ordered are listed, but only abnormal results are displayed) Labs Reviewed - No data to display  EKG  EKG Interpretation None       Radiology Dg Lumbar Spine Complete  Result Date: 03/31/2017 CLINICAL DATA:  Patient fell in parking lot landing on buttocks. Bilateral buttock and arm pain. EXAM: LUMBAR SPINE - COMPLETE 4+ VIEW COMPARISON:  CT 08/19/2015 FINDINGS: There are 5 non ribbed lumbar vertebrae. There is joint space narrowing and sclerosis of the L3 through S1 facets. No pars defects. No listhesis. No acute fracture of the lumbar spine. Alignment is normal. Intervertebral disc spaces are maintained. The included sacroiliac joints are maintained. IMPRESSION: Lower lumbar  degenerative facet arthropathy L3 through S1. No acute  osseous abnormality. Electronically Signed   By: Ashley Royalty M.D.   On: 03/31/2017 17:47   Dg Hips Bilat W Or Wo Pelvis 3-4 Views  Result Date: 03/31/2017 CLINICAL DATA:  Fall EXAM: DG HIP (WITH OR WITHOUT PELVIS) 3-4V BILAT COMPARISON:  08/18/2015 FINDINGS: There is no evidence of hip fracture or dislocation. There is no evidence of arthropathy or other focal bone abnormality. IMPRESSION: Negative. Electronically Signed   By: Franchot Gallo M.D.   On: 03/31/2017 17:45     Procedures Procedures (including critical care time)  Medications Ordered in ED Medications  acetaminophen (TYLENOL) tablet 650 mg (not administered)     Initial Impression / Assessment and Plan / ED Course  I have reviewed the triage vital signs and the nursing notes.  Pertinent labs & imaging results that were available during my care of the patient were reviewed by me and considered in my medical decision making (see chart for details).     Pt is well appearing, NV intact.  No focal neuro deficits.    [address] on hospital premises.   XR's ordered. No concerning sx's of head injury or indication for CT head or neck  1700 Pt returned from XR and I was informed that she is requesting to be discharged.  I have spoken to her and she agrees to stay until XR's  resulted.   Pt also seen by Dr. Dayna Barker after review of XR's.  Pt appears stable for d/c, agrees to PCP f/u and return precautions discussed.   Final Clinical Impressions(s) / ED Diagnoses   Final diagnoses:  Fall, initial encounter  Lumbar contusion, initial encounter    New Prescriptions New Prescriptions   No medications on file     Kem Parkinson, Hershal Coria 04/02/17 [id number]    Merrily Pew, MD 04/08/17 1041

## 2017-03-31 NOTE — ED Notes (Signed)
Dr Dolly Rias in to assess

## 2017-03-31 NOTE — ED Notes (Signed)
To radiology

## 2017-03-31 NOTE — ED Triage Notes (Addendum)
Per patient she fell in parking lot while here to visit patient. Per patient fell while trying to step up on curb. Patient states landed on buttocks but tried to catch herself with her arms. Patient c/o bilateral pain in buttocks and arms. Denies hitting head or LOC. No obvious deformity noted.

## 2017-03-31 NOTE — Discharge Instructions (Signed)
Apply ice packs on/off to your lower back.  Follow-up with your doctor for recheck.  Return here for any worsening symptoms

## 2017-04-01 IMAGING — CR DG ABDOMEN 1V
1 series · 1 of 1 positions shown · non-contrast
Comparison: 09/21/2014.  CT 05/18/2014.

CLINICAL DATA: Renal calculus.

EXAM:
ABDOMEN - 1 VIEW

[t abdomen supine]
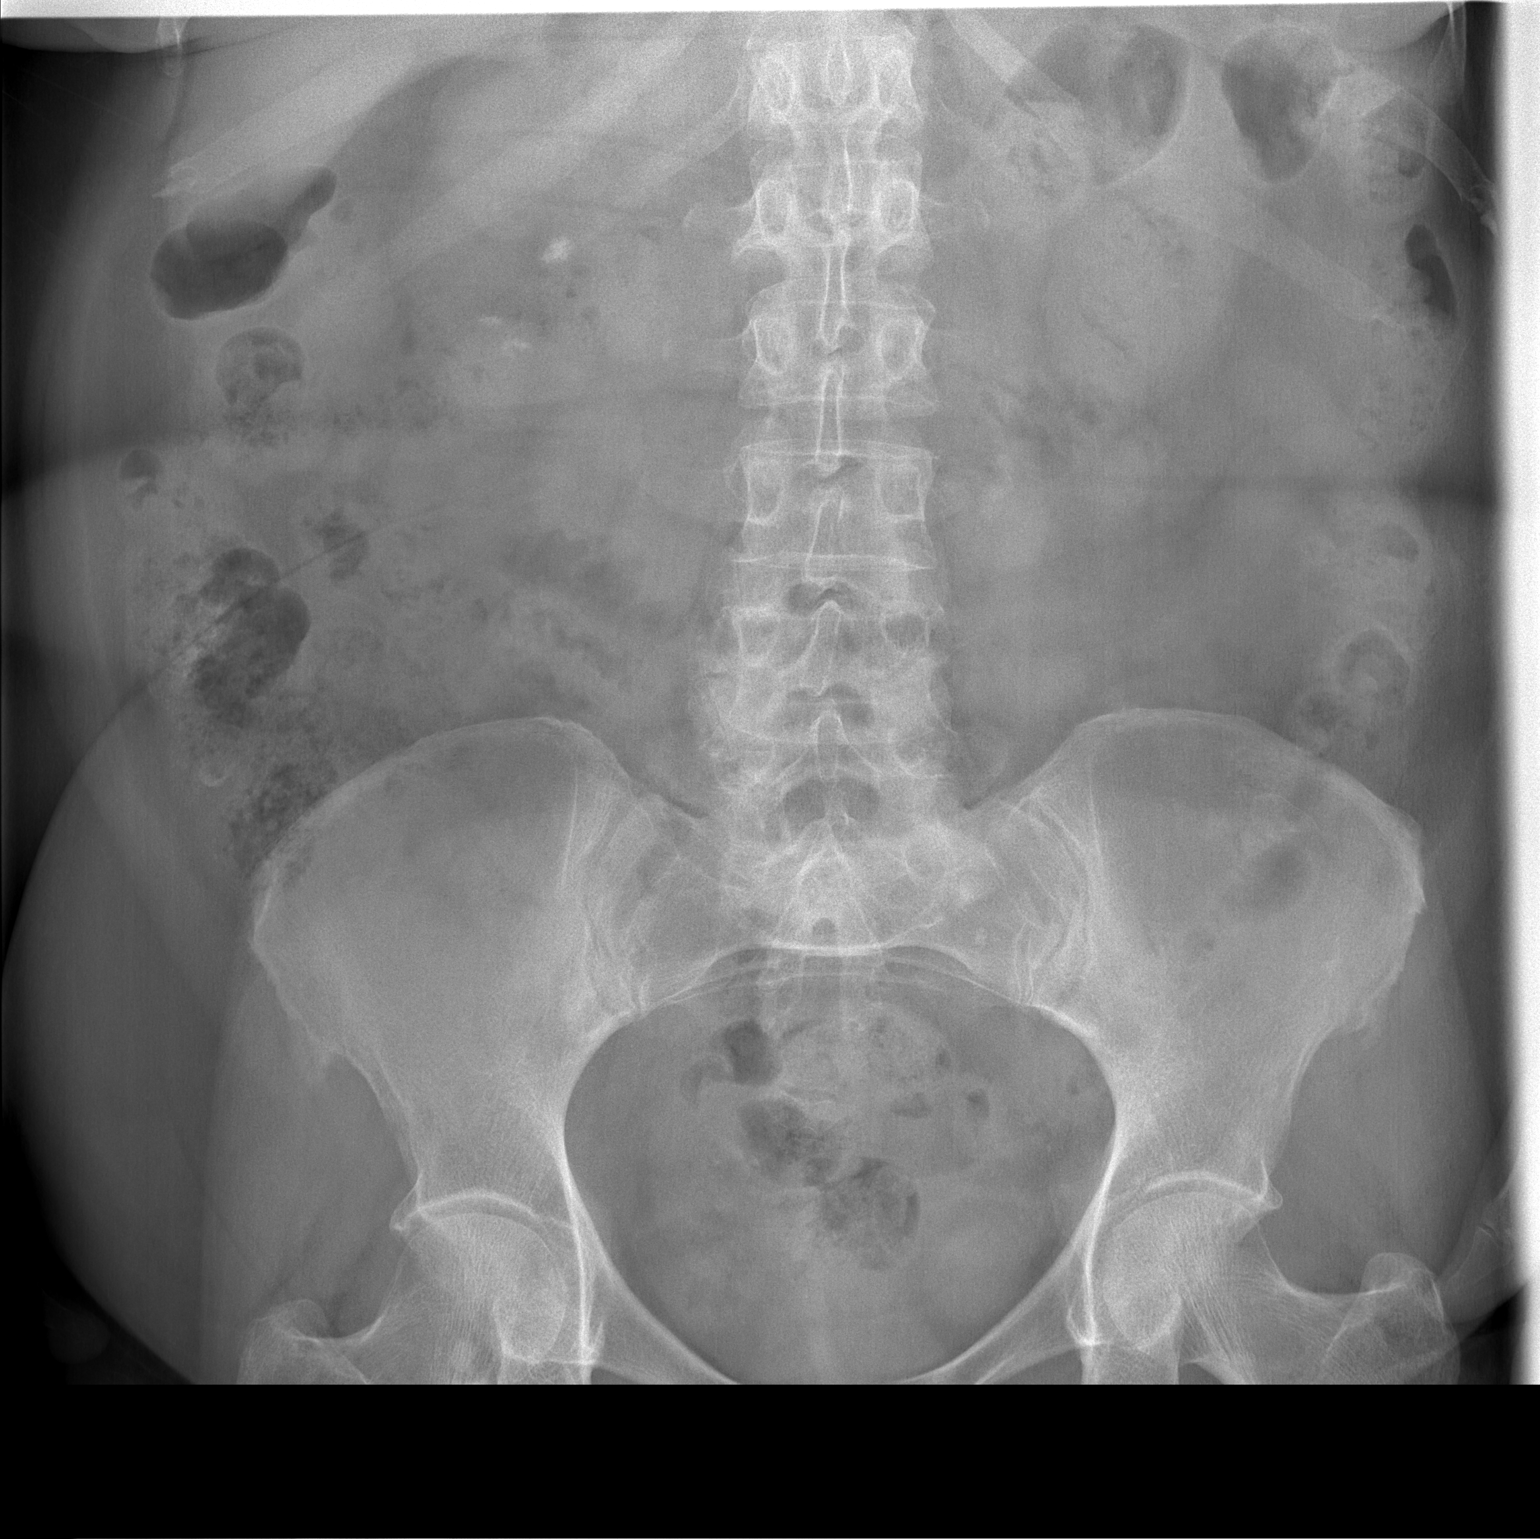

[1 of 1 positions shown; findings below may reference images not displayed]

FINDINGS: Prominent amount of stool noted throughout the colon making
evaluation for renal stone disease difficult. Right nephrolithiasis
again noted. Previously identified left renal pelvic stone is no
longer identified. Stone fragments over the left mid and distal
ureter cannot be excluded.
IMPRESSION: 1. Previously identified stone projected over the left renal pelvis
is no longer identified. Stone fragments over the left mid ureter
and left lower ureter appear to be present.
2. Right nephrolithiasis again noted .

## 2017-04-02 ENCOUNTER — Other Ambulatory Visit: Payer: Self-pay

## 2017-04-04 DIAGNOSIS — Z79899 Other long term (current) drug therapy: Secondary | ICD-10-CM | POA: Diagnosis not present

## 2017-04-04 DIAGNOSIS — D509 Iron deficiency anemia, unspecified: Secondary | ICD-10-CM | POA: Diagnosis not present

## 2017-04-04 DIAGNOSIS — R809 Proteinuria, unspecified: Secondary | ICD-10-CM | POA: Diagnosis not present

## 2017-04-04 DIAGNOSIS — N183 Chronic kidney disease, stage 3 (moderate): Secondary | ICD-10-CM | POA: Diagnosis not present

## 2017-04-04 DIAGNOSIS — E559 Vitamin D deficiency, unspecified: Secondary | ICD-10-CM | POA: Diagnosis not present

## 2017-04-05 ENCOUNTER — Telehealth: Payer: Self-pay

## 2017-04-05 DIAGNOSIS — I10 Essential (primary) hypertension: Secondary | ICD-10-CM | POA: Diagnosis not present

## 2017-04-05 DIAGNOSIS — N183 Chronic kidney disease, stage 3 (moderate): Secondary | ICD-10-CM | POA: Diagnosis not present

## 2017-04-05 DIAGNOSIS — E559 Vitamin D deficiency, unspecified: Secondary | ICD-10-CM | POA: Diagnosis not present

## 2017-04-05 DIAGNOSIS — D509 Iron deficiency anemia, unspecified: Secondary | ICD-10-CM | POA: Diagnosis not present

## 2017-04-05 DIAGNOSIS — R809 Proteinuria, unspecified: Secondary | ICD-10-CM | POA: Diagnosis not present

## 2017-04-05 NOTE — Telephone Encounter (Signed)
Telephone outreach to patient to provide community resources. Referral received from [facility], LPN. Patient answered but phone call service was not good and call was dropped. Tried again and left message with direct number to contact me.    Josepha Pigg, B.A.  Care Guide - [facility] [place]

## 2017-04-10 ENCOUNTER — Telehealth: Payer: Self-pay

## 2017-04-10 DIAGNOSIS — C9000 Multiple myeloma not having achieved remission: Secondary | ICD-10-CM | POA: Diagnosis not present

## 2017-04-10 DIAGNOSIS — N183 Chronic kidney disease, stage 3 (moderate): Secondary | ICD-10-CM | POA: Diagnosis not present

## 2017-04-10 DIAGNOSIS — R809 Proteinuria, unspecified: Secondary | ICD-10-CM | POA: Diagnosis not present

## 2017-04-10 DIAGNOSIS — E559 Vitamin D deficiency, unspecified: Secondary | ICD-10-CM | POA: Diagnosis not present

## 2017-04-10 NOTE — Telephone Encounter (Signed)
Additional outreach attempt to pt to provide community resources. No answer, left vm.    Cheryl Abbott, B.A.  Care Guide

## 2017-04-15 ENCOUNTER — Encounter (HOSPITAL_COMMUNITY)
Admission: RE | Admit: 2017-04-15 | Discharge: 2017-04-15 | Disposition: A | Discharge status: Home / Self Care | Payer: Medicare Other | Source: Ambulatory Visit | Attending: Nephrology | Admitting: Nephrology

## 2017-04-15 DIAGNOSIS — D509 Iron deficiency anemia, unspecified: Secondary | ICD-10-CM | POA: Insufficient documentation

## 2017-04-15 IMAGING — DX DG ABDOMEN 1V
1 series · 1 of 1 positions shown · non-contrast
Comparison: 10/21/2014, CT 05/18/2014

CLINICAL DATA: Renal calculi, nausea and vomiting

EXAM:
ABDOMEN - 1 VIEW

[abdomen supine]
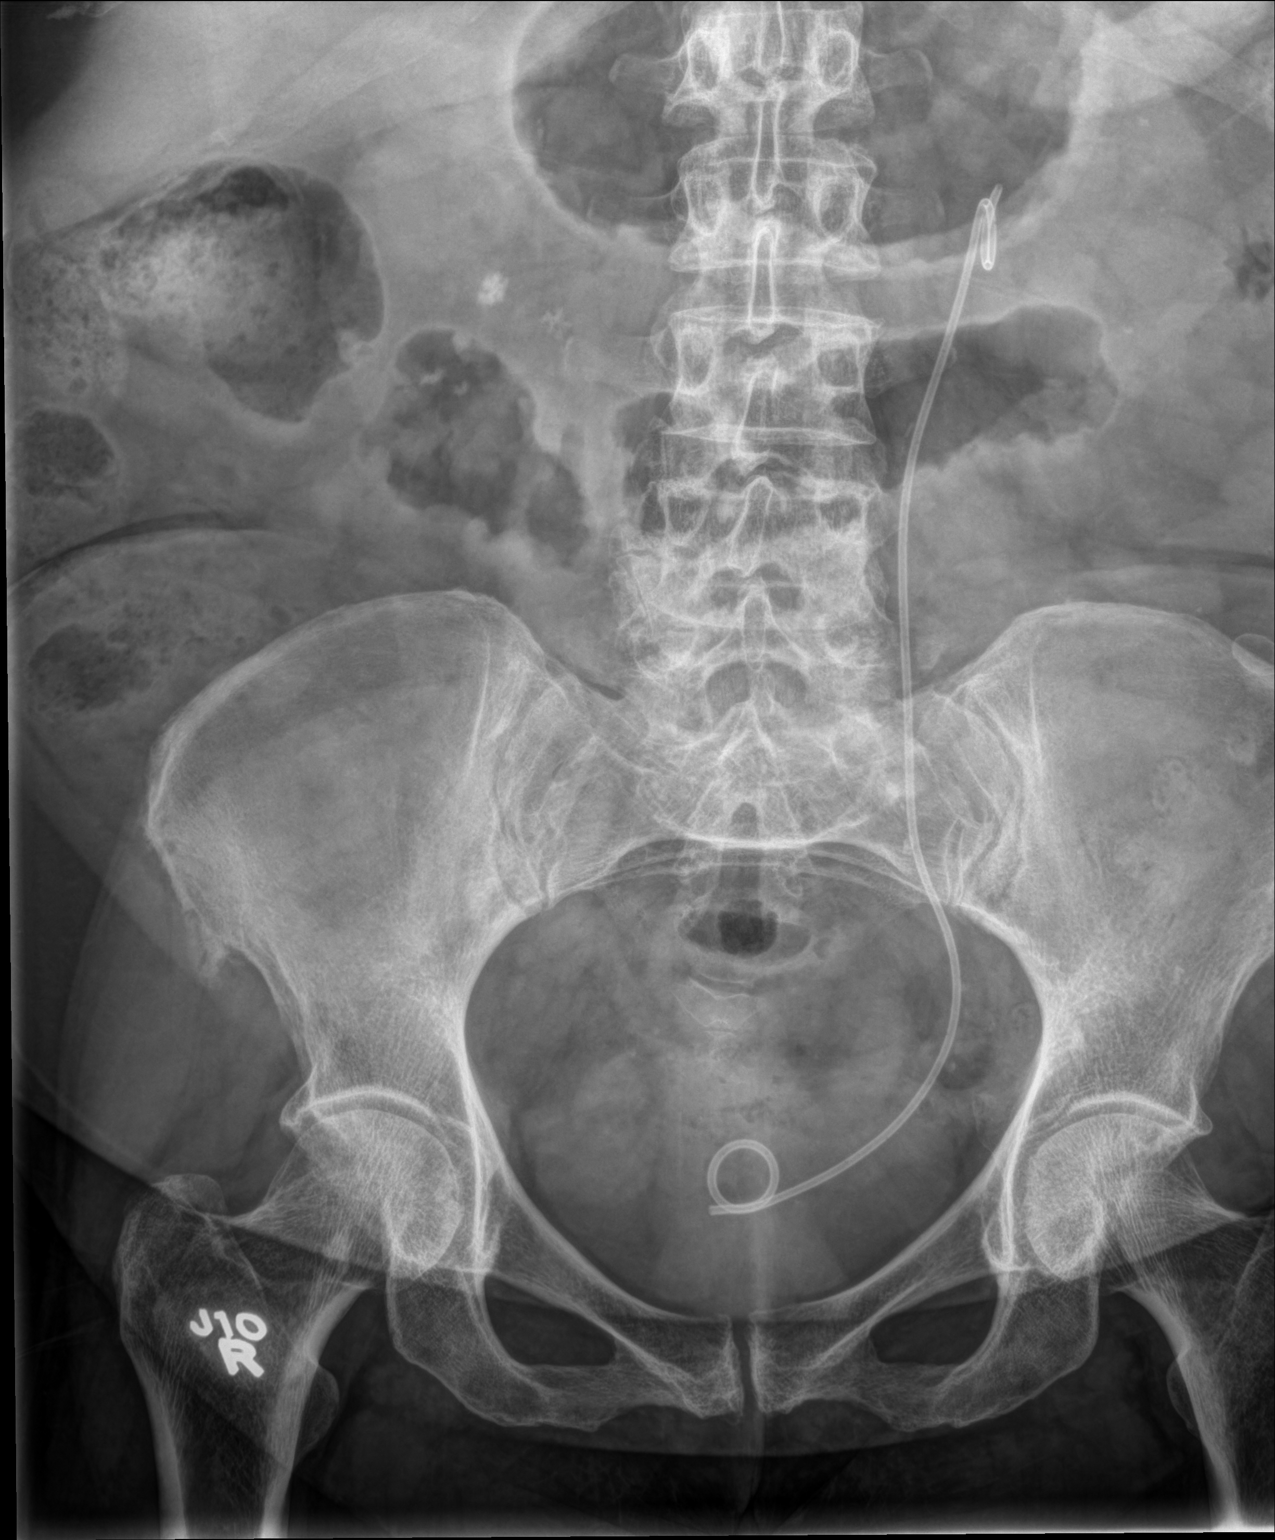

[1 of 1 positions shown; findings below may reference images not displayed]

FINDINGS: Bilateral radiopaque renal calculi are identified. Left nephro
ureteral stent in place. Right femoral bone island is noted.
Probable vascular calcification projecting over the sacrum, just
medial to the midportion of the stent. Moderate stool burden. No
dilated loop of small bowel. Colonic wall thickness at upper limits
of normal.
IMPRESSION: Nonobstructive bowel gas pattern with transverse colonic wall
thickness at upper limits of normal which could be seen with colitis
but is nonspecific.

## 2017-04-15 MED ORDER — SODIUM CHLORIDE 0.9 % IV SOLN
510.0000 mg | Freq: Once | INTRAVENOUS | Status: AC
  Administered 2017-04-15: 510 mg via INTRAVENOUS
  Filled 2017-04-15: qty 17

## 2017-04-15 MED ORDER — SODIUM CHLORIDE 0.9 % IV SOLN
INTRAVENOUS | Status: DC
  Administered 2017-04-15: 250 mL via INTRAVENOUS

## 2017-04-17 DIAGNOSIS — E119 Type 2 diabetes mellitus without complications: Secondary | ICD-10-CM | POA: Diagnosis not present

## 2017-04-17 DIAGNOSIS — H2513 Age-related nuclear cataract, bilateral: Secondary | ICD-10-CM | POA: Diagnosis not present

## 2017-04-17 DIAGNOSIS — H5203 Hypermetropia, bilateral: Secondary | ICD-10-CM | POA: Diagnosis not present

## 2017-04-17 DIAGNOSIS — H52203 Unspecified astigmatism, bilateral: Secondary | ICD-10-CM | POA: Diagnosis not present

## 2017-04-17 DIAGNOSIS — H524 Presbyopia: Secondary | ICD-10-CM | POA: Diagnosis not present

## 2017-04-17 LAB — HM DIABETES EYE EXAM

## 2017-04-18 ENCOUNTER — Encounter (HOSPITAL_COMMUNITY): Payer: Medicare Other | Attending: Oncology

## 2017-04-18 DIAGNOSIS — C9000 Multiple myeloma not having achieved remission: Secondary | ICD-10-CM | POA: Diagnosis not present

## 2017-04-18 DIAGNOSIS — I129 Hypertensive chronic kidney disease with stage 1 through stage 4 chronic kidney disease, or unspecified chronic kidney disease: Secondary | ICD-10-CM | POA: Insufficient documentation

## 2017-04-18 DIAGNOSIS — Z7982 Long term (current) use of aspirin: Secondary | ICD-10-CM | POA: Diagnosis not present

## 2017-04-18 DIAGNOSIS — N189 Chronic kidney disease, unspecified: Secondary | ICD-10-CM | POA: Diagnosis not present

## 2017-04-18 DIAGNOSIS — K219 Gastro-esophageal reflux disease without esophagitis: Secondary | ICD-10-CM | POA: Insufficient documentation

## 2017-04-18 DIAGNOSIS — Z79899 Other long term (current) drug therapy: Secondary | ICD-10-CM | POA: Insufficient documentation

## 2017-04-18 DIAGNOSIS — G4733 Obstructive sleep apnea (adult) (pediatric): Secondary | ICD-10-CM | POA: Diagnosis not present

## 2017-04-18 DIAGNOSIS — Z881 Allergy status to other antibiotic agents status: Secondary | ICD-10-CM | POA: Insufficient documentation

## 2017-04-18 DIAGNOSIS — Z87891 Personal history of nicotine dependence: Secondary | ICD-10-CM | POA: Insufficient documentation

## 2017-04-18 DIAGNOSIS — Z880 Allergy status to penicillin: Secondary | ICD-10-CM | POA: Insufficient documentation

## 2017-04-18 DIAGNOSIS — D472 Monoclonal gammopathy: Secondary | ICD-10-CM | POA: Diagnosis not present

## 2017-04-18 DIAGNOSIS — F329 Major depressive disorder, single episode, unspecified: Secondary | ICD-10-CM | POA: Insufficient documentation

## 2017-04-18 DIAGNOSIS — E785 Hyperlipidemia, unspecified: Secondary | ICD-10-CM | POA: Insufficient documentation

## 2017-04-18 DIAGNOSIS — F419 Anxiety disorder, unspecified: Secondary | ICD-10-CM | POA: Insufficient documentation

## 2017-04-18 DIAGNOSIS — E1122 Type 2 diabetes mellitus with diabetic chronic kidney disease: Secondary | ICD-10-CM | POA: Diagnosis not present

## 2017-04-18 DIAGNOSIS — D509 Iron deficiency anemia, unspecified: Secondary | ICD-10-CM | POA: Diagnosis not present

## 2017-04-18 DIAGNOSIS — E1140 Type 2 diabetes mellitus with diabetic neuropathy, unspecified: Secondary | ICD-10-CM | POA: Diagnosis not present

## 2017-04-18 LAB — COMPREHENSIVE METABOLIC PANEL
ALT: 17 U/L (ref 14–54)
AST: 22 U/L (ref 15–41)
Albumin: 3.9 g/dL (ref 3.5–5.0)
Alkaline Phosphatase: 111 U/L (ref 38–126)
Anion gap: 8 (ref 5–15)
BUN: 12 mg/dL (ref 6–20)
CO2: 26 mmol/L (ref 22–32)
Calcium: 9.9 mg/dL (ref 8.9–10.3)
Chloride: 101 mmol/L (ref 101–111)
Creatinine, Ser: 1.6 mg/dL — ABNORMAL HIGH (ref 0.44–1.00)
GFR calc Af Amer: 36 mL/min — ABNORMAL LOW (ref >60)
GFR calc non Af Amer: 31 mL/min — ABNORMAL LOW (ref >60)
Glucose, Bld: 162 mg/dL — ABNORMAL HIGH (ref 65–99)
Potassium: 3.9 mmol/L (ref 3.5–5.1)
Sodium: 135 mmol/L (ref 135–145)
Total Bilirubin: 0.3 mg/dL (ref 0.3–1.2)
Total Protein: 8.8 g/dL — ABNORMAL HIGH (ref 6.5–8.1)

## 2017-04-18 LAB — CBC WITH DIFFERENTIAL/PLATELET
Basophils Absolute: 0 10*3/uL (ref 0.0–0.1)
Basophils Relative: 0 %
Eosinophils Absolute: 0.3 10*3/uL (ref 0.0–0.7)
Eosinophils Relative: 4 %
HCT: 36.9 % (ref 36.0–46.0)
Hemoglobin: 11.6 g/dL — ABNORMAL LOW (ref 12.0–15.0)
Lymphocytes Relative: 21 %
Lymphs Abs: 1.9 10*3/uL (ref 0.7–4.0)
MCH: 27.2 pg (ref 26.0–34.0)
MCHC: 31.4 g/dL (ref 30.0–36.0)
MCV: 86.6 fL (ref 78.0–100.0)
Monocytes Absolute: 0.6 10*3/uL (ref 0.1–1.0)
Monocytes Relative: 7 %
Neutro Abs: 6 10*3/uL (ref 1.7–7.7)
Neutrophils Relative %: 68 %
Platelets: 330 10*3/uL (ref 150–400)
RBC: 4.26 MIL/uL (ref 3.87–5.11)
RDW: 17.8 % — ABNORMAL HIGH (ref 11.5–15.5)
WBC: 8.9 10*3/uL (ref 4.0–10.5)

## 2017-04-19 DIAGNOSIS — R0902 Hypoxemia: Secondary | ICD-10-CM | POA: Diagnosis not present

## 2017-04-19 LAB — KAPPA/LAMBDA LIGHT CHAINS
Kappa free light chain: 45.3 mg/L — ABNORMAL HIGH (ref 3.3–19.4)
Kappa, lambda light chain ratio: 2.56 — ABNORMAL HIGH (ref 0.26–1.65)
Lambda free light chains: 17.7 mg/L (ref 5.7–26.3)

## 2017-04-19 LAB — BETA 2 MICROGLOBULIN, SERUM: Beta-2 Microglobulin: 3.3 mg/L — ABNORMAL HIGH (ref 0.6–2.4)

## 2017-04-22 ENCOUNTER — Encounter (HOSPITAL_COMMUNITY)
Admission: RE | Admit: 2017-04-22 | Discharge: 2017-04-22 | Disposition: A | Discharge status: Home / Self Care | Payer: Medicare Other | Source: Ambulatory Visit | Attending: Nephrology | Admitting: Nephrology

## 2017-04-22 DIAGNOSIS — D509 Iron deficiency anemia, unspecified: Secondary | ICD-10-CM | POA: Diagnosis not present

## 2017-04-22 LAB — MULTIPLE MYELOMA PANEL, SERUM
Albumin SerPl Elph-Mcnc: 3.8 g/dL (ref 2.9–4.4)
Albumin/Glob SerPl: 0.8 (ref 0.7–1.7)
Alpha 1: 0.2 g/dL (ref 0.0–0.4)
Alpha2 Glob SerPl Elph-Mcnc: 1 g/dL (ref 0.4–1.0)
B-Globulin SerPl Elph-Mcnc: 1.3 g/dL (ref 0.7–1.3)
Gamma Glob SerPl Elph-Mcnc: 2.4 g/dL — ABNORMAL HIGH (ref 0.4–1.8)
Globulin, Total: 4.8 g/dL — ABNORMAL HIGH (ref 2.2–3.9)
IgA: 249 mg/dL (ref 64–422)
IgG (Immunoglobin G), Serum: 2478 mg/dL — ABNORMAL HIGH (ref 700–1600)
IgM (Immunoglobulin M), Srm: 151 mg/dL (ref 26–217)
M Protein SerPl Elph-Mcnc: 1.7 g/dL — ABNORMAL HIGH
Total Protein ELP: 8.6 g/dL — ABNORMAL HIGH (ref 6.0–8.5)

## 2017-04-22 MED ORDER — SODIUM CHLORIDE 0.9 % IV SOLN
Freq: Once | INTRAVENOUS | Status: AC
  Administered 2017-04-22: 250 mL via INTRAVENOUS

## 2017-04-22 MED ORDER — SODIUM CHLORIDE 0.9 % IV SOLN
510.0000 mg | Freq: Once | INTRAVENOUS | Status: AC
  Administered 2017-04-22: 510 mg via INTRAVENOUS
  Filled 2017-04-22: qty 17

## 2017-04-25 ENCOUNTER — Other Ambulatory Visit: Payer: Self-pay

## 2017-04-25 ENCOUNTER — Encounter (HOSPITAL_COMMUNITY): Payer: Self-pay

## 2017-04-25 ENCOUNTER — Encounter (HOSPITAL_BASED_OUTPATIENT_CLINIC_OR_DEPARTMENT_OTHER): Payer: Medicare Other | Admitting: Oncology

## 2017-04-25 VITALS — BP 124/73 | HR 90 | Temp 98.4°F | Resp 16 | Ht 66.0 in | Wt 202.0 lb

## 2017-04-25 DIAGNOSIS — R5383 Other fatigue: Secondary | ICD-10-CM | POA: Diagnosis not present

## 2017-04-25 DIAGNOSIS — C9000 Multiple myeloma not having achieved remission: Secondary | ICD-10-CM | POA: Diagnosis not present

## 2017-04-25 DIAGNOSIS — D509 Iron deficiency anemia, unspecified: Secondary | ICD-10-CM

## 2017-04-25 NOTE — Progress Notes (Signed)
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  HISTORY OF PRESENTING ILLNESS: Cheryl Abbott 71 y.o. female Presents for evaluation M spike in urine and serum. Patient been seen her nephrologist for continued follow-up of her CKD and had labwork performed on 12/07/16. 24 hour urine IFE which demonstrated Bence-Jones protein positive; kappa type. Immunofixation showed IgG monoclonal proteins with kappa light chain specificity. Urine M spike was 8.3 mg per 24 hours. Urine light chain excess 670 mg/l. Serum immunofixation also showed IgG monoclonal protein with light chain specificity. SPEP demonstrated M spike of 1.5 g/dL. Free kappa light chains 36.8, free lambda light chains 18.5, free kappa/lambda light chain ratio 1.99. Creatinine 1.6. Calcium 9.8. Patient states she feels fatigued. She has been having abdominal bloating for the past one week also. She denies any chest pain or shortness of breath. She denies any nausea or vomiting. She denies any bone pain, however she does have bilateral ankle pains. Skeletal survey on 02/15/2017 was negative for lytic lesions. Bone marrow biopsy on 02/19/2017 demonstrated bone marrow with trilineage hematopoiesis and plasma cells representing 14% of all cells with occasional atypical forms. Immunohistochemical stains were performed and highlighted the plasma cell component in relatively limited bone marrow areas which appear to show kappa light chain excess. Cytogenetics revealed normal female karyotype with no observable clonal chromosomal  abnormalities.  INTERVAL HISTORY: Patient presents today for continue follow-up with her daughter.  She stated that she is sustained a mechanical fall and hit her head and mid-October.  She did not sustain any head injury.  She states that she has been having low-grade fever this week in the afternoon.  Her temperature today in clinic is 98.4 F.  She denies any bone pain.  She denies any chest pain, shortness of breath, abdominal pain, focal weakness.  She still feels fatigued.  She continues to take 1 iron tablet daily.  She had received 2 doses of IV Feraheme on 04/15/17 and 04/22/17.  Hemoglobin is 11.6 g/dL today, up from 9.9 g/dL on 02/28/2017.  Review of Systems - Oncology ROS as per HPI otherwise 12 point ROS is negative.   MEDICAL HISTORY: Past Medical History:  Diagnosis Date  . Anemia   . Anxiety   . Anxiety disorder   . Arthritis   . Cancer ([place])   . 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 Chronic back pain   . Depression   . Fatty liver   . GERD (gastroesophageal reflux disease)   . Headache   . History of adenomatous polyp of colon    2008  . History of kidney stones   . Hypercalcemia   . Hyperlipidemia   . Hypertension   . Left ureteral stone   . Lipoma of arm 01/19/2015  . Lumbar disc disease with radiculopathy   . Nephrolithiasis   . Neuropathy, peripheral   . OSA treated with BiPAP    per study 2007  . Sciatica   . Sigmoid diverticulosis   . Type 2 diabetes mellitus ([place])     SURGICAL HISTORY: Past Surgical History:  Procedure Laterality Date  . BIOPSY N/A 03/17/2013   Procedure: GASTRIC BIOPSIES;  Surgeon:  Danie Binder, MD;  Location: [facility];  Service: Endoscopy;  Laterality: N/A;  . BIOPSY  06/10/2015   Procedure: BIOPSY;  Surgeon: Danie Binder, MD;  [facility];  Service: Endoscopy;;  gastric biopsy  . CARDIAC CATHETERIZATION  05-11-2003  dr Shelva Majestic ([facility])   Abnormal cardiolite/   Normal coronary arteries and  normal LVF,  ef  63%  . CARDIOVASCULAR STRESS TEST  01-01-2014   normal lexiscan cardiolite/  no ischemia/ infarct/  normal LV function and wall motion ,  ef 81%  . COLONOSCOPY  last one 10/02/2011   [facility] TICS, IH, NEXT TCS APR 2018  . COLONOSCOPY WITH PROPOFOL N/A 02/26/2017   Procedure: COLONOSCOPY WITH PROPOFOL;  Surgeon: Danie Binder, MD;  [facility]: [facility];  Service: Endoscopy;  Laterality: N/A;  11:00am  . CYSTO/   RIGHT URETEROSOCOPY LASER LITHOTRIPSY STONE EXTRACTION  10-13-2004  . CYSTO/  RIGHT RETROGRADE PYELOGRAM/  PLACMENT RIGHT URETERAL STENT  01-10-2010  . CYSTOSCOPY W/ URETERAL STENT PLACEMENT Right 08/19/2015   Procedure: CYSTOSCOPY WITH RETROGRADE PYELOGRAM/URETERAL STENT PLACEMENT;  Surgeon: Franchot Gallo, MD;  Location: [facility];  Service: Urology;  Laterality: Right;  . CYSTOSCOPY WITH RETROGRADE PYELOGRAM, URETEROSCOPY AND STENT PLACEMENT Left 10/21/2014   Procedure: CYSTOSCOPY WITH RETROGRADE PYELOGRAM, URETEROSCOPY AND STENT PLACEMENT;  Surgeon: Franchot Gallo, MD;  Location[facility];  Service: Urology;  Laterality: Left;  . CYSTOSCOPY WITH RETROGRADE PYELOGRAM, URETEROSCOPY AND STENT PLACEMENT Right 11/22/2015   Procedure: CYSTOSCOPY, RIGHT URETERAL STENT REMOVAL; RIGHT RETROGRADE PYELOGRAM, RIGHT URETEROSCOPY WITH BALLOON DILATION; RIGHT URETERAL STENT PLACEMENT;  Surgeon: Franchot Gallo, MD;  Location: [facility];  Service: Urology;  Laterality: Right;  . CYSTOSCOPY/RETROGRADE/URETEROSCOPY/STONE EXTRACTION WITH BASKET Bilateral 08/02/2015   Procedure: CYSTOSCOPY; BILATERAL RETROGRADE PYELOGRAMS; BILATERAL URETEROSCOPY, STONE EXTRACTION WITH BASKET;  Surgeon: Franchot Gallo, MD;  Location: [facility];  Service: Urology;  Laterality: Bilateral;  . CYSTOSCOPY/RETROGRADE/URETEROSCOPY/STONE EXTRACTION WITH BASKET Right 06/28/2015   Procedure: CYSTOSCOPY/RETROGRADE/URETEROSCOPY/STONE EXTRACTION WITH BASKET, RIGHT URETERAL DOUBLE J STENT PLACEMENT;   Surgeon: Franchot Gallo, MD;  Location: [facility];  Service: Urology;  Laterality: Right;  . ESOPHAGOGASTRODUODENOSCOPY (EGD) WITH PROPOFOL N/A 03/17/2013   Procedure: ESOPHAGOGASTRODUODENOSCOPY (EGD) WITH PROPOFOL;  Surgeon: Danie Binder, MD;  Location: [facility];  Service: Endoscopy;  Laterality: N/A;  . ESOPHAGOGASTRODUODENOSCOPY (EGD) WITH PROPOFOL N/A 06/10/2015   Distal gastritis, distal esophageal stricture s/p dilation  . ESOPHAGOGASTRODUODENOSCOPY (EGD) WITH PROPOFOL N/A 02/26/2017   Procedure: ESOPHAGOGASTRODUODENOSCOPY (EGD) WITH PROPOFOL;  Surgeon: Danie Binder, MD;  [facility];  Service: Endoscopy;  Laterality: N/A;  . FLEXIBLE SIGMOIDOSCOPY  09/11/2011   WEX:[id number] Internal hemorrhoids  . HOLMIUM LASER APPLICATION Left 8/93/8101   Procedure: HOLMIUM LASER APPLICATION;  Surgeon: Franchot Gallo, MD;  Location: [facility];  Service: Urology;  Laterality: Left;  . HOLMIUM LASER APPLICATION Right 7/51/0258   Procedure: HOLMIUM LASER APPLICATION;  Surgeon: Franchot Gallo, MD;  Location: [facility];  Service: Urology;  Laterality: Right;  . MASS EXCISION Left 03/04/2015   Procedure: EXCISION OF SOFT TISSUE NEOPLASM LEFT ARM;  Surgeon: Aviva Signs, MD;  Location: [facility];  Service: General;  Laterality: Left;  . PERCUTANEOUS NEPHROSTOLITHOTOMY Bilateral 12/  2011     [facility]  . POLYPECTOMY N/A 03/17/2013   Procedure: GASTRIC POLYPECTOMY;  Surgeon: Danie Binder, MD;  Location: [facility];  Service: Endoscopy;  Laterality: N/A;  . POLYPECTOMY  02/26/2017   Procedure: POLYPECTOMY;  Surgeon: Danie Binder, MD;  [facility];  Service: Endoscopy;;  polyp at cecum, ascending colon polyps x3, hepatic flexure polyps x8, transverse colon polyps x8   . REMOVAL RIGHT THIGH CYST  2006  . SAVORY DILATION N/A 03/17/2013   Procedure: SAVORY DILATION;  Surgeon: Danie Binder, MD;  Location: [facility];  Service: Endoscopy;  Laterality: N/A;  #12.8, 14, 15, 16 dilators  used  . SAVORY DILATION N/A 06/10/2015   Procedure: SAVORY DILATION;  Surgeon: Danie Binder, MD;  [facility];  Service: Endoscopy;  Laterality: N/A;  . TRANSTHORACIC ECHOCARDIOGRAM  01-01-2014   mild LVH/  ef 03-54%/  grade I diastolic dysfunction/  trivial MR, TR, and PR  . VAGINAL HYSTERECTOMY  1970's    SOCIAL HISTORY: Social History   Socioeconomic History  . Marital status: Divorced    Spouse name: Not on file  . Number of children: 2  . Years of education: Not on file  . Highest education level: Not on file  Social Needs  . Financial resource strain: Not on file  . Food insecurity - worry: Not on file  . Food insecurity - inability: Not on file  . Transportation needs - medical: Not on file  . Transportation needs - non-medical: Not on file  Occupational History  . Occupation: retired   Tobacco Use  . Smoking status: Former Smoker    Packs/day: 1.00    Years: 20.00    Pack years: 20.00    Types: Cigarettes    Start date: 09/21/1974    Last attempt to quit: 09/20/1988    Years since quitting: 28.6  . Smokeless tobacco: Never Used  . Tobacco comment: Quit x 20 years  Substance and Sexual Activity  . Alcohol use: No    Alcohol/week: 0.0 oz  . Drug use: No  . Sexual activity: No    Birth control/protection: Surgical  Other Topics Concern  . Not on file  Social History Narrative   Patient is right handed   Patient drinks some caffeine daily.    FAMILY HISTORY Family History  Problem Relation Age of Onset  . Hypertension Mother   . Diabetes Mother   . Heart failure Mother   . Dementia Mother   . Emphysema Father   . Hypertension Father   . Diabetes Brother   . GER disease Brother   . Hypertension Sister   . Hypertension Sister   . Cancer Unknown        family history   . Diabetes Unknown        family history   . Heart defect Unknown        famiily history   . Arthritis Unknown        family history   . Anesthesia problems Neg Hx   .  Hypotension Neg Hx   . Malignant hyperthermia Neg Hx   . Pseudochol deficiency Neg Hx   . Colon cancer Neg Hx     ALLERGIES:  is allergic to ace inhibitors; keflex [cephalexin]; nitrofurantoin; and penicillins.  MEDICATIONS:  Current Outpatient Medications  Medication Sig Dispense Refill  . ALPRAZolam (XANAX) 0.5 MG tablet Take 1 tablet (0.5 mg total) by mouth 2 (two) times daily. 60 tablet 4  . amLODipine (NORVASC) 10 MG tablet Take 1 tablet (10 mg total) by mouth daily. 90 tablet 3  . aspirin EC 325 MG tablet Take 325 mg by mouth daily.    . Cholecalciferol (VITAMIN D3) 5000 units CAPS Take 1 capsule by  mouth 2 (two) times daily.    . clopidogrel (PLAVIX) 75 MG tablet Take 75 mg by mouth daily.    . Cyanocobalamin (B-12 PO) Take 1 tablet by mouth daily.    . diphenhydramine-acetaminophen (TYLENOL PM) 25-500 MG TABS tablet Take 2 tablets by mouth at bedtime.    . docusate sodium (COLACE) 100 MG capsule Take 200 mg by mouth at bedtime.     . iron polysaccharides (NIFEREX) 150 MG capsule Take 150 mg by mouth daily.    Marland Kitchen lovastatin (MEVACOR) 40 MG tablet TAKE 2 TABLETS (80 MG TOTAL) BY MOUTH AT BEDTIME. 180 tablet 1  . OXYGEN Inhale into the lungs daily. Bedtime    . pantoprazole (PROTONIX) 40 MG tablet TAKE 1 TABLET (40 MG TOTAL) BY MOUTH 2 (TWO) TIMES DAILY. 180 tablet 1  . PARoxetine (PAXIL) 40 MG tablet Take 1 tablet (40 mg total) by mouth every morning. 90 tablet 1  . TRADJENTA 5 MG TABS tablet TAKE 1 TABLET BY MOUTH EVERY DAY 90 tablet 1  . traMADol (ULTRAM) 50 MG tablet Take 1 tablet (50 mg total) by mouth every 6 (six) hours as needed. 12 tablet 0  . triamcinolone cream (KENALOG) 0.1 % Apply 1 application topically 3 (three) times daily. (Patient taking differently: Apply 1 application topically 3 (three) times daily as needed. ) 30 g 0   No current facility-administered medications for this visit.     PHYSICAL EXAMINATION:  ECOG PERFORMANCE STATUS: 1 - Symptomatic but  completely ambulatory   Vitals:   04/25/17 1400  BP: 124/73  Pulse: 90  Resp: 16  Temp: 98.4 F (36.9 C)  SpO2: 96%    Filed Weights   04/25/17 1400  Weight: 202 lb (91.6 kg)     Physical Exam Constitutional: Well-developed, well-nourished, and in no distress.   HENT:  Head: Normocephalic and atraumatic.  Mouth/Throat: No oropharyngeal exudate. Mucosa moist. Eyes: Pupils are equal, round, and reactive to light. Conjunctivae are normal. No scleral icterus.  Neck: Normal range of motion. Neck supple. No JVD present.  Cardiovascular: Normal rate, regular rhythm and normal heart sounds.  Exam reveals no gallop and no friction rub.   No murmur heard. Pulmonary/Chest: Effort normal and breath sounds normal. No respiratory distress. No wheezes.No rales.  Abdominal: Soft. Bowel sounds are normal. No distension. There is no tenderness. There is no guarding.  Musculoskeletal: No edema or tenderness.  Lymphadenopathy:    No cervical or supraclavicular adenopathy.  Neurological: Alert and oriented to person, place, and time. No cranial nerve deficit.  Skin: Skin is warm and dry. No rash noted. No erythema. No pallor.  Psychiatric: Affect and judgment normal.   LABORATORY DATA: I have personally reviewed the data as listed:  Appointment on 04/18/2017  Component Date Value Ref Range Status  . WBC 04/18/2017 8.9  4.0 - 10.5 K/uL Final  . RBC 04/18/2017 4.26  3.87 - 5.11 MIL/uL Final  . Hemoglobin 04/18/2017 11.6* 12.0 - 15.0 g/dL Final  . HCT 04/18/2017 36.9  36.0 - 46.0 % Final  . MCV 04/18/2017 86.6  78.0 - 100.0 fL Final  . MCH 04/18/2017 27.2  26.0 - 34.0 pg Final  . MCHC 04/18/2017 31.4  30.0 - 36.0 g/dL Final  . RDW 04/18/2017 17.8* 11.5 - 15.5 % Final  . Platelets 04/18/2017 330  150 - 400 K/uL Final  . Neutrophils Relative % 04/18/2017 68  % Final  . Neutro Abs 04/18/2017 6.0  1.7 - 7.7 K/uL Final  .  Lymphocytes Relative 04/18/2017 21  % Final  . Lymphs Abs 04/18/2017  1.9  0.7 - 4.0 K/uL Final  . Monocytes Relative 04/18/2017 7  % Final  . Monocytes Absolute 04/18/2017 0.6  0.1 - 1.0 K/uL Final  . Eosinophils Relative 04/18/2017 4  % Final  . Eosinophils Absolute 04/18/2017 0.3  0.0 - 0.7 K/uL Final  . Basophils Relative 04/18/2017 0  % Final  . Basophils Absolute 04/18/2017 0.0  0.0 - 0.1 K/uL Final  . Sodium 04/18/2017 135  135 - 145 mmol/L Final  . Potassium 04/18/2017 3.9  3.5 - 5.1 mmol/L Final  . Chloride 04/18/2017 101  101 - 111 mmol/L Final  . CO2 04/18/2017 26  22 - 32 mmol/L Final  . Glucose, Bld 04/18/2017 162* 65 - 99 mg/dL Final  . BUN 04/18/2017 12  6 - 20 mg/dL Final  . Creatinine, Ser 04/18/2017 1.60* 0.44 - 1.00 mg/dL Final  . Calcium 04/18/2017 9.9  8.9 - 10.3 mg/dL Final  . Total Protein 04/18/2017 8.8* 6.5 - 8.1 g/dL Final  . Albumin 04/18/2017 3.9  3.5 - 5.0 g/dL Final  . AST 04/18/2017 22  15 - 41 U/L Final  . ALT 04/18/2017 17  14 - 54 U/L Final  . Alkaline Phosphatase 04/18/2017 111  38 - 126 U/L Final  . Total Bilirubin 04/18/2017 0.3  0.3 - 1.2 mg/dL Final  . GFR calc non Af Amer 04/18/2017 31* >60 mL/min Final  . GFR calc Af Amer 04/18/2017 36* >60 mL/min Final   Comment: (NOTE) The eGFR has been calculated using the CKD EPI equation. This calculation has not been validated in all clinical situations. eGFR's persistently <60 mL/min signify possible Chronic Kidney Disease.   . Anion gap 04/18/2017 8  5 - 15 Final  . IgG (Immunoglobin G), Serum 04/18/2017 2,478* 700 - 1,600 mg/dL Final  . IgA 04/18/2017 249  64 - 422 mg/dL Final  . IgM (Immunoglobulin M), Srm 04/18/2017 151  26 - 217 mg/dL Final  . Total Protein ELP 04/18/2017 8.6* 6.0 - 8.5 g/dL Corrected  . Albumin SerPl Elph-Mcnc 04/18/2017 3.8  2.9 - 4.4 g/dL Corrected  . Alpha 1 04/18/2017 0.2  0.0 - 0.4 g/dL Corrected  . Alpha2 Glob SerPl Elph-Mcnc 04/18/2017 1.0  0.4 - 1.0 g/dL Corrected  . B-Globulin SerPl Elph-Mcnc 04/18/2017 1.3  0.7 - 1.3 g/dL Corrected  .  Gamma Glob SerPl Elph-Mcnc 04/18/2017 2.4* 0.4 - 1.8 g/dL Corrected  . M Protein SerPl Elph-Mcnc 04/18/2017 1.7* Not Observed g/dL Corrected  . Globulin, Total 04/18/2017 4.8* 2.2 - 3.9 g/dL Corrected  . Albumin/Glob SerPl 04/18/2017 0.8  0.7 - 1.7 Corrected  . IFE 1 04/18/2017 Comment   Corrected   Comment: (NOTE) Immunofixation shows IgG monoclonal protein with kappa light chain specificity.   . Please Note 04/18/2017 Comment   Corrected   Comment: (NOTE) Protein electrophoresis scan will follow via computer, mail, or courier delivery. Performed At: [facility] [place], [place] [id number] Rush Farmer MD [id number]   . Kappa free light chain 04/18/2017 45.3* 3.3 - 19.4 mg/L Final  . Lamda free light chains 04/18/2017 17.7  5.7 - 26.3 mg/L Final  . Kappa, lamda light chain ratio 04/18/2017 2.56* 0.26 - 1.65 Final   Comment: (NOTE) Performed At: [facility] [address] [place], [place] [id number] Rush Farmer MD [id number]   . Beta-2 Microglobulin 04/18/2017 3.3* 0.6 - 2.4 mg/L Final   Comment: (NOTE) Siemens Immulite 2000 Immunochemiluminometric  assay [facility]) Performed At: [facility] [place], [place] [id number] Rush Farmer MD WU:[id number]    PATHOLOGY: Diagnosis Bone Marrow, Aspirate,Biopsy, and Clot, left iliac crest - BONE MARROW WITH TRILINEAGE HEMATOPOIESIS AND PLASMACYTOSIS. - SEE COMMENT. PERIPHERAL BLOOD: - MILD NORMOCYTIC-NORMOCHROMIC ANEMIA. Diagnosis Note The clot and biopsy sections are suboptimal for evaluation. The aspirate shows trilineage hematopoiesis with non-specific changes, but there is increased number of plasma cells representing 14% of all cells with occasional atypical forms. Immunohistochemical stains were performed and highlight the plasma cell component in relatively limited bone marrow areas which appear to show kappa light chain excess. In the presence of a monoclonal  protein with kappa light specificity, the limited findings favor plasma cell neoplasm. Correlation with cytogenetic and FISH studies are recommended. (BNS:ah/gt 02/20/17) Susanne Greenhouse MD Pathologist, Electronic RADIOGRAPHIC STUDIES: I have personally reviewed the radiological images as listed and agree with the findings in the report  No results found.  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  -Bone marrow biopsy demonstrated 14% plasma cells with normal cytogenetics. -Bone survey negative for lytic lesions. -I have reviewed her labs with her today. Her hemoglobin has improved with iron supplementation. She still does not meet CRAB (hypercalcemia, renal failure with creatinine >2, anemia <10 g/dL, bone lytic lesions) criteria for symptomatic treatment requiring starting treatment.  -Discussed induction treatment with revlimid, velcade, and decadron in the future should she progress.  Iron deficiency anemia -She has received 2 doses of IV Feraheme on 04/15/17 and 04/22/17.  Hemoglobin is 11.6 g/dL today, up from 9.9 g/dL on 02/28/2017. -She continues to take 1 iron tablet daily.   -RTC in 3 months for follow up with labs below.  Orders Placed This Encounter  Procedures  . CBC with Differential    Standing Status:   Future    Standing Expiration Date:   04/25/2018  . Comprehensive metabolic panel    Standing Status:   Future    Standing Expiration Date:   04/25/2018  . Multiple Myeloma Panel (SPEP&IFE w/QIG)    Standing Status:   Future    Standing Expiration Date:   04/25/2018  . Kappa/lambda light chains    Standing Status:   Future    Standing Expiration Date:   04/25/2018  . Iron and TIBC    Standing Status:   Future    Standing Expiration Date:   04/25/2018  . Ferritin    Standing Status:   Future    Standing Expiration Date:   04/25/2018    All questions were answered. The patient knows to call the  clinic with any problems, questions or concerns.  This note was electronically signed.    Twana First, MD  04/25/2017 2:52 PM

## 2017-04-29 ENCOUNTER — Encounter (HOSPITAL_COMMUNITY): Payer: Self-pay

## 2017-04-29 ENCOUNTER — Ambulatory Visit (HOSPITAL_COMMUNITY)
Admission: RE | Admit: 2017-04-29 | Discharge: 2017-04-29 | Disposition: A | Discharge status: Home / Self Care | Payer: Medicare Other | Source: Ambulatory Visit | Attending: Family Medicine | Admitting: Family Medicine

## 2017-04-29 DIAGNOSIS — Z1231 Encounter for screening mammogram for malignant neoplasm of breast: Secondary | ICD-10-CM | POA: Diagnosis not present

## 2017-05-10 IMAGING — US US RENAL
1 series · 14 of 25 positions shown · non-contrast
Comparison: CT 07/26/2015.

CLINICAL DATA: Ureteral calculus.

EXAM:
RENAL / URINARY TRACT ULTRASOUND COMPLETE

[Series 1: us renal · 0.24mm/px · 14 of 55 slices shown]
[im 1/55]
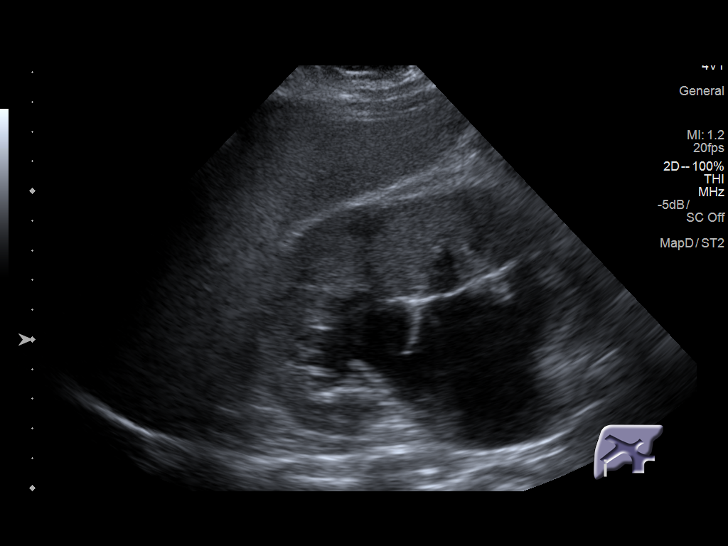
[im 5/55]
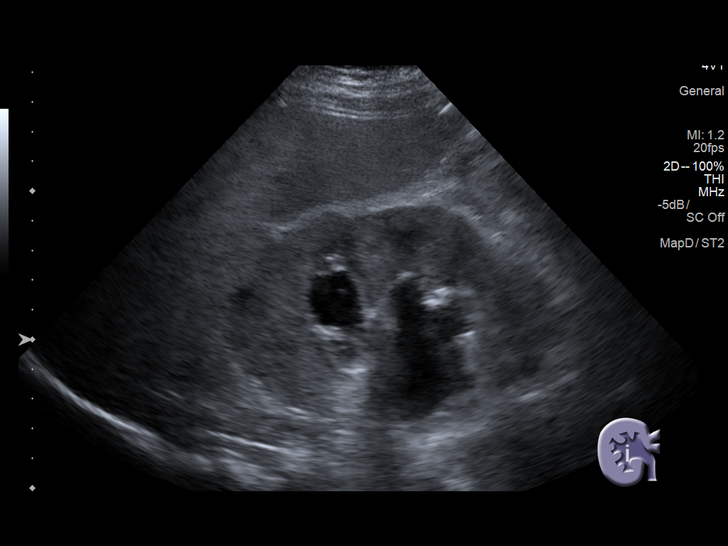
[im 10/55]
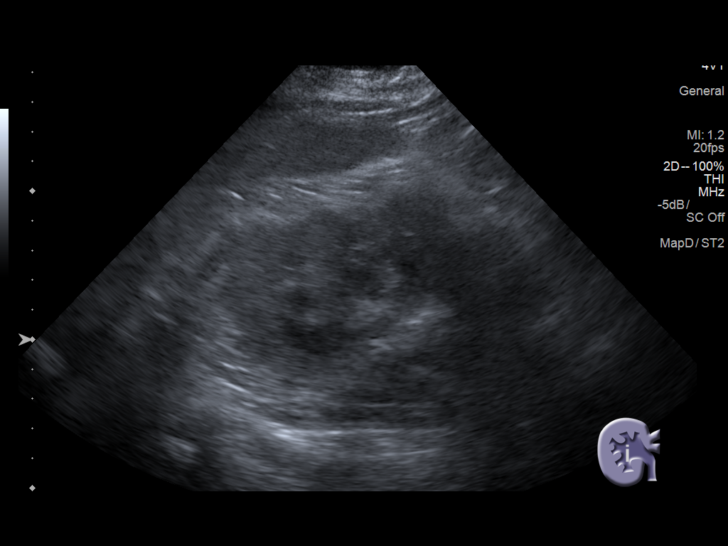
[im 14/55]
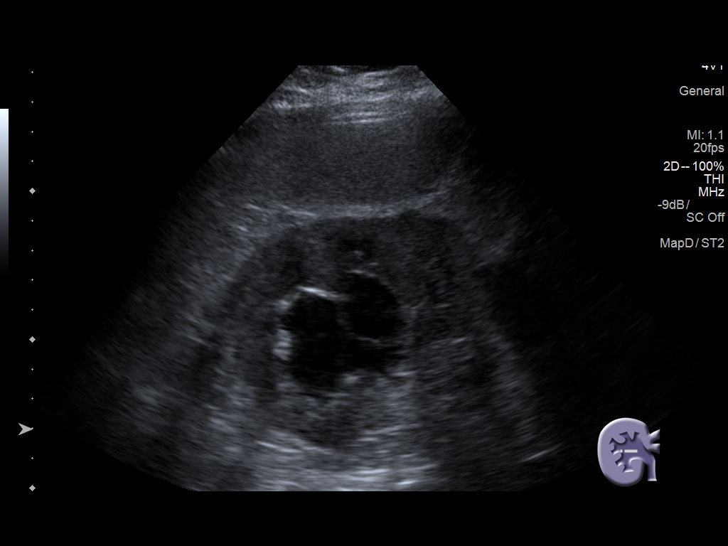
[im 19/55]
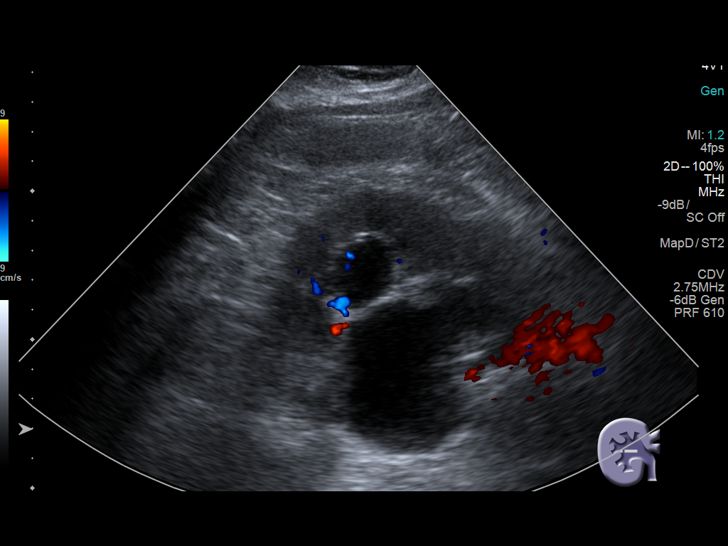
[im 21/55]
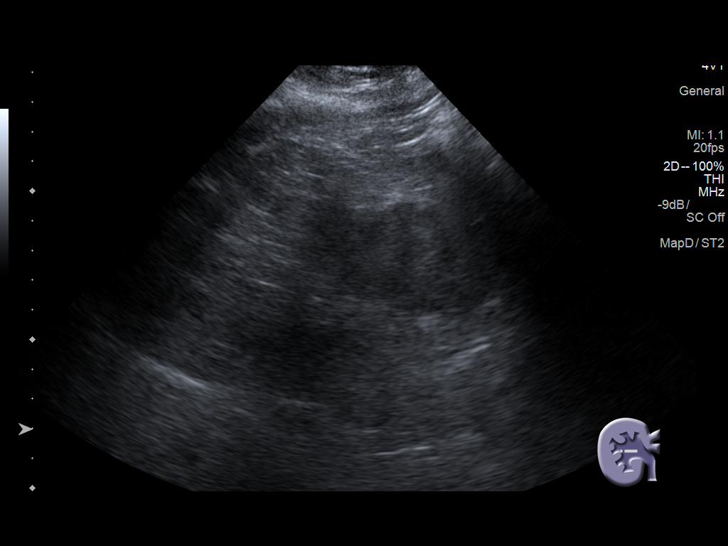
[im 25/55]
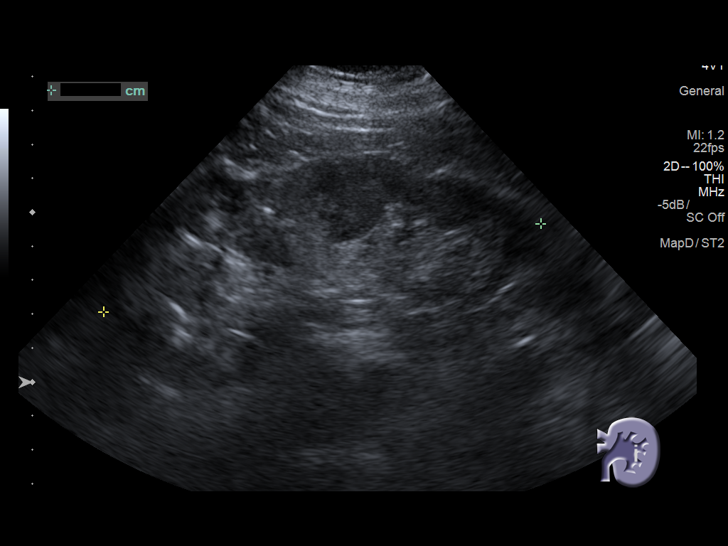
[im 30/55]
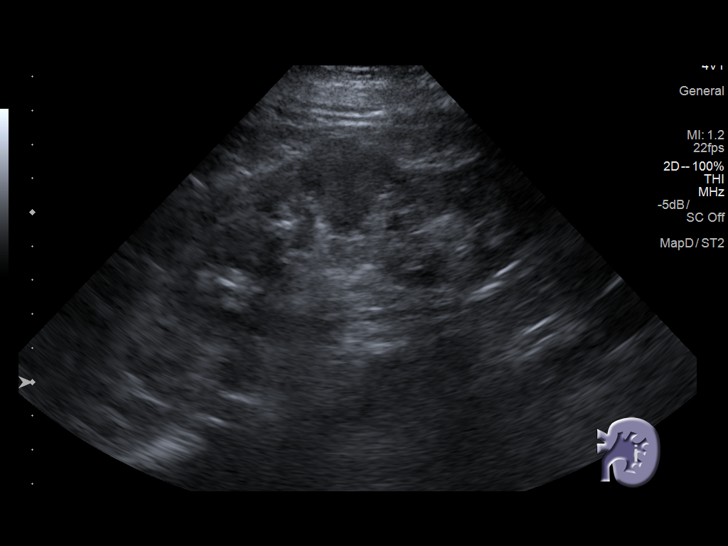
[im 34/55]
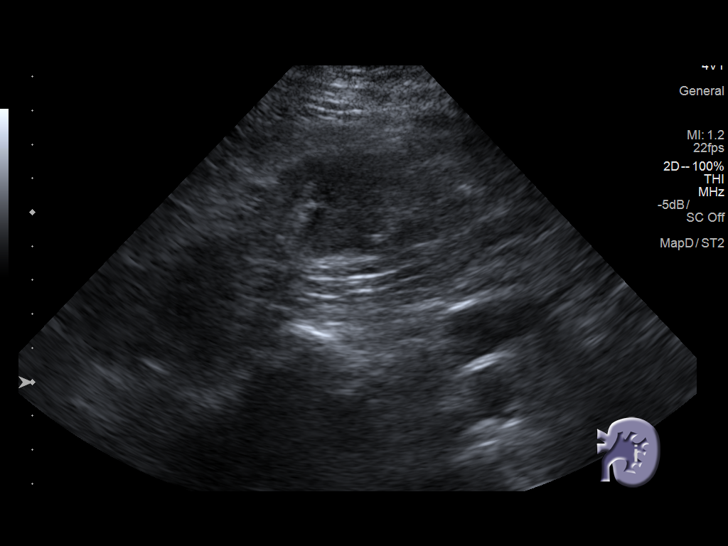
[im 37/55]
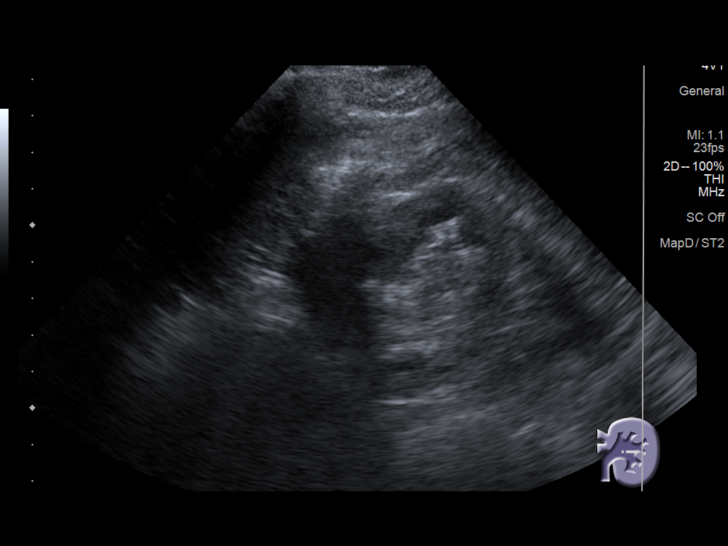
[im 41/55]
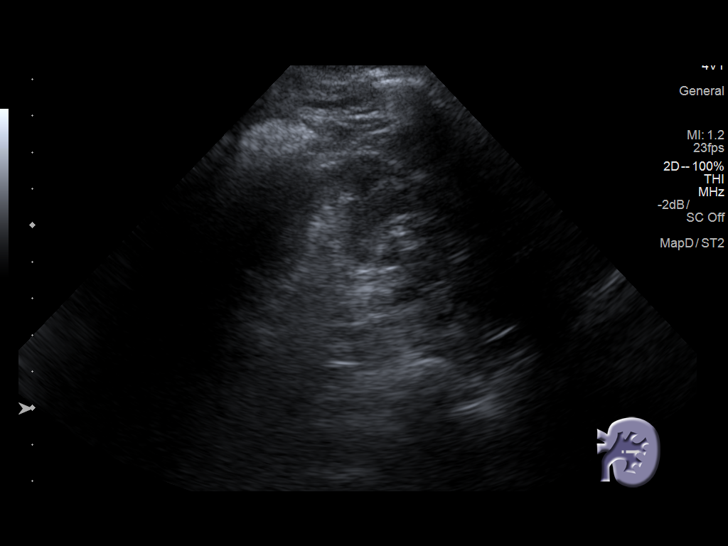
[im 46/55]
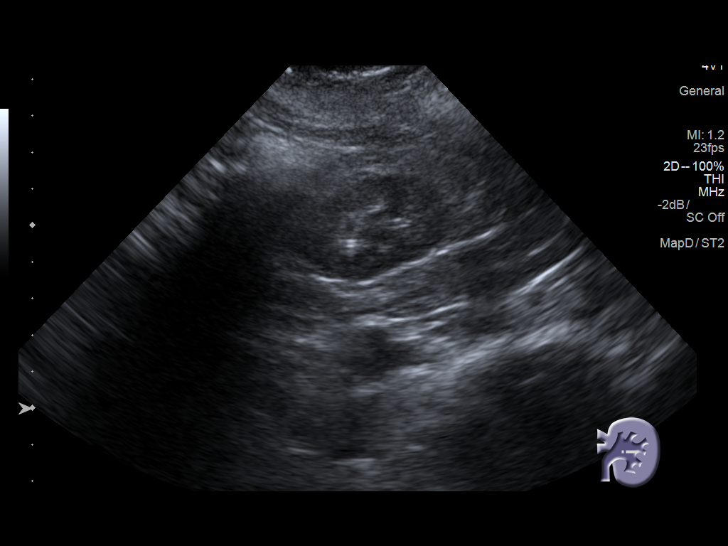
[im 50/55]
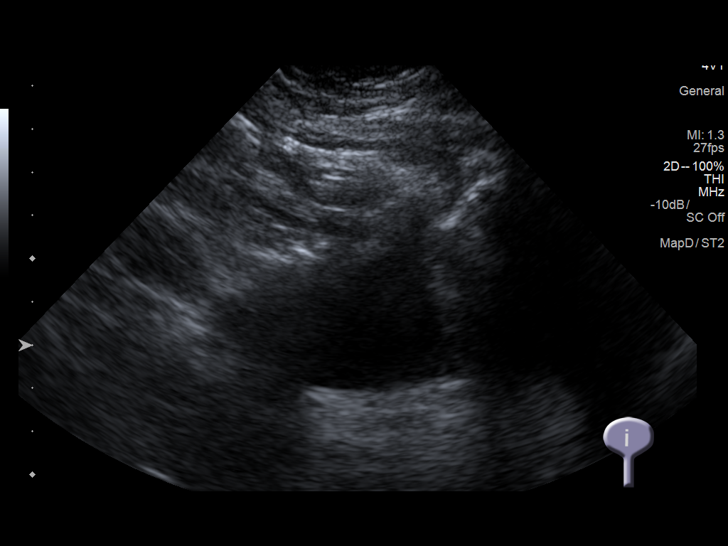
[im 55/55]
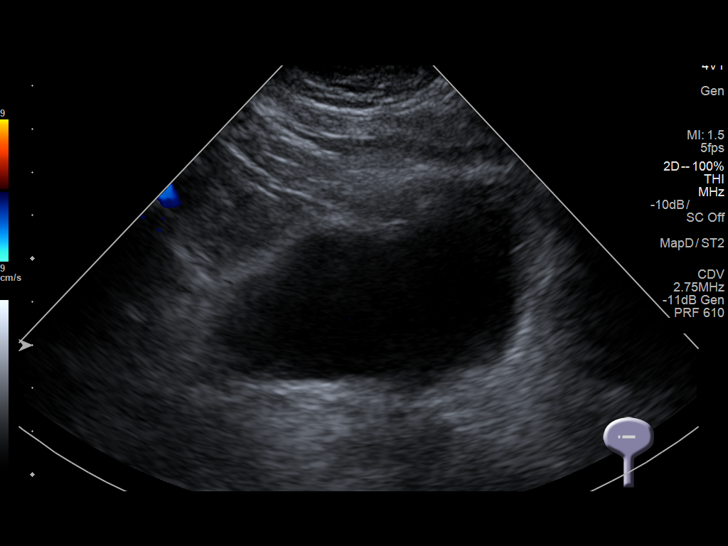

[14 of 25 positions shown; findings below may reference images not displayed]

FINDINGS: Right Kidney:

Length: 13.6 cm. Echogenicity within normal limits. No mass.
Prominent right hydronephrosis again noted. Probable right
nephrolithiasis.

Left Kidney:

Length: 13.2 cm. Echogenicity within normal limits. No mass or
hydronephrosis visualized. Probable left nephrolithiasis.

Bladder:

Appears normal for degree of bladder distention. No ureteral jets
identified. Patient refused postvoid exam.
IMPRESSION: Prominent right hydronephrosis again noted. Bilateral
nephrolithiasis.

## 2017-05-19 DIAGNOSIS — R0902 Hypoxemia: Secondary | ICD-10-CM | POA: Diagnosis not present

## 2017-06-07 ENCOUNTER — Telehealth: Payer: Self-pay

## 2017-06-07 DIAGNOSIS — E785 Hyperlipidemia, unspecified: Secondary | ICD-10-CM

## 2017-06-07 DIAGNOSIS — E1121 Type 2 diabetes mellitus with diabetic nephropathy: Secondary | ICD-10-CM

## 2017-06-07 NOTE — Telephone Encounter (Signed)
Labs ordered.

## 2017-06-12 DIAGNOSIS — E785 Hyperlipidemia, unspecified: Secondary | ICD-10-CM | POA: Diagnosis not present

## 2017-06-12 DIAGNOSIS — E1121 Type 2 diabetes mellitus with diabetic nephropathy: Secondary | ICD-10-CM | POA: Diagnosis not present

## 2017-06-13 LAB — COMPLETE METABOLIC PANEL WITH GFR
AG Ratio: 1 (calc) (ref 1.0–2.5)
ALT: 20 U/L (ref 6–29)
AST: 22 U/L (ref 10–35)
Albumin: 4.2 g/dL (ref 3.6–5.1)
Alkaline phosphatase (APISO): 89 U/L (ref 33–130)
BUN/Creatinine Ratio: 10 (calc) (ref 6–22)
BUN: 14 mg/dL (ref 7–25)
CO2: 29 mmol/L (ref 20–32)
Calcium: 10.1 mg/dL (ref 8.6–10.4)
Chloride: 101 mmol/L (ref 98–110)
Creat: 1.39 mg/dL — ABNORMAL HIGH (ref 0.60–0.93)
GFR, Est African American: 44 mL/min/{1.73_m2} — ABNORMAL LOW (ref >=60)
GFR, Est Non African American: 38 mL/min/{1.73_m2} — ABNORMAL LOW (ref >=60)
Globulin: 4.1 g/dL (calc) — ABNORMAL HIGH (ref 1.9–3.7)
Glucose, Bld: 110 mg/dL — ABNORMAL HIGH (ref 65–99)
Potassium: 4.6 mmol/L (ref 3.5–5.3)
Sodium: 137 mmol/L (ref 135–146)
Total Bilirubin: 0.3 mg/dL (ref 0.2–1.2)
Total Protein: 8.3 g/dL — ABNORMAL HIGH (ref 6.1–8.1)

## 2017-06-13 LAB — LIPID PANEL
Cholesterol: 181 mg/dL (ref <200)
HDL: 47 mg/dL — ABNORMAL LOW (ref >50)
LDL Cholesterol (Calc): 104 mg/dL (calc) — ABNORMAL HIGH
Non-HDL Cholesterol (Calc): 134 mg/dL (calc) — ABNORMAL HIGH (ref <130)
Total CHOL/HDL Ratio: 3.9 (calc) (ref <5.0)
Triglycerides: 188 mg/dL — ABNORMAL HIGH (ref <150)

## 2017-06-13 LAB — HEMOGLOBIN A1C
Hgb A1c MFr Bld: 7.2 % of total Hgb — ABNORMAL HIGH (ref <5.7)
Mean Plasma Glucose: 160 (calc)
eAG (mmol/L): 8.9 (calc)

## 2017-06-17 ENCOUNTER — Encounter: Payer: Self-pay | Admitting: Family Medicine

## 2017-06-17 ENCOUNTER — Ambulatory Visit (INDEPENDENT_AMBULATORY_CARE_PROVIDER_SITE_OTHER): Payer: Medicare Other | Admitting: Family Medicine

## 2017-06-17 VITALS — BP 114/80 | HR 93 | Resp 16 | Ht 66.0 in | Wt 207.0 lb

## 2017-06-17 DIAGNOSIS — M5416 Radiculopathy, lumbar region: Secondary | ICD-10-CM

## 2017-06-17 DIAGNOSIS — F411 Generalized anxiety disorder: Secondary | ICD-10-CM | POA: Diagnosis not present

## 2017-06-17 DIAGNOSIS — G8929 Other chronic pain: Secondary | ICD-10-CM | POA: Diagnosis not present

## 2017-06-17 DIAGNOSIS — M25571 Pain in right ankle and joints of right foot: Secondary | ICD-10-CM | POA: Diagnosis not present

## 2017-06-17 DIAGNOSIS — I10 Essential (primary) hypertension: Secondary | ICD-10-CM

## 2017-06-17 DIAGNOSIS — E785 Hyperlipidemia, unspecified: Secondary | ICD-10-CM

## 2017-06-17 DIAGNOSIS — E1121 Type 2 diabetes mellitus with diabetic nephropathy: Secondary | ICD-10-CM | POA: Diagnosis not present

## 2017-06-17 DIAGNOSIS — K219 Gastro-esophageal reflux disease without esophagitis: Secondary | ICD-10-CM | POA: Diagnosis not present

## 2017-06-17 MED ORDER — LOVASTATIN 40 MG PO TABS: ORAL_TABLET | ORAL | 1 refills | Status: DC

## 2017-06-17 MED ORDER — PAROXETINE HCL 40 MG PO TABS: 40.0000 mg | ORAL_TABLET | Freq: Every morning | ORAL | 1 refills | Status: DC

## 2017-06-17 MED ORDER — ALPRAZOLAM 0.5 MG PO TABS: 0.5000 mg | ORAL_TABLET | Freq: Two times a day (BID) | ORAL | 3 refills | Status: DC

## 2017-06-17 NOTE — Progress Notes (Signed)
Cheryl Abbott     MRN: [id number]      DOB: 1946/03/17   HPI [name] is here for follow up and re-evaluation of chronic medical conditions, medication management and review of any available recent lab and radiology data.  Preventive health is updated, specifically  Cancer screening and Immunization.   Questions or concerns regarding consultations or procedures which the PT has had in the interim are  addressed. The PT denies any adverse reactions to current medications since the last visit.  Bilateral ankle pain when she walks x 4 weeks . Worse on right ankle, has had  stress fracture of right ankle approx 15 years ago which took her out of work now notes instability in the ankle , wore a boot for approx 6  Months Denies polyuria, polydipsia, blurred vision , or hypoglycemic episodes.  ROS Denies recent fever or chills. Denies sinus pressure, nasal congestion, ear pain or sore throat. Denies chest congestion, productive cough or wheezing. Denies chest pains, palpitations and leg swelling Denies abdominal pain, nausea, vomiting,diarrhea or constipation.   Denies dysuria, frequency, hesitancy or incontinence.  Denies headaches, seizures, numbness, or tingling. Denies uncontrolled depression, anxiety or insomnia. Denies skin break down or rash.   PE  BP 114/80   Pulse 93   Resp 16   Ht 5\' 6"  (1.676 m)   Wt 207 lb (93.9 kg)   SpO2 97%   BMI 33.41 kg/m   Patient alert and oriented and in no cardiopulmonary distress.  HEENT: No facial asymmetry, EOMI,   oropharynx pink and moist.  Neck supple no JVD, no mass.  Chest: Clear to auscultation bilaterally.  CVS: S1, S2 no murmurs, no S3.Regular rate.  ABD: Soft non tender.   Ext: No edema  MS: Decreased  ROM lumbar  Spine,adequate in shoulders, hips and knees.Normal  ROM both  ankles  Skin: Intact, no ulcerations or rash noted.  Psych: Good eye contact, normal affect. Memory intact not anxious or depressed  appearing.  CNS: CN 2-12 intact, power,  normal throughout.no focal deficits noted.   Assessment & Plan  Lumbar back pain with radiculopathy affecting left lower extremity Increased symptoms with c//o left foot and ankle pain, I explained that pain is radiaiting from nerves in her spine , offered PT, refused stating that she has done that In the past and it causes worsening of her symptoms  Type 2 diabetes with nephropathy (HCC) Controlled, no change in medication [name] is reminded of the importance of commitment to daily physical activity for 30 minutes or more, as able and the need to limit carbohydrate intake to 30 to 60 grams per meal to help with blood sugar control.   The need to take medication as prescribed, test blood sugar as directed, and to call between visits if there is a concern that blood sugar is uncontrolled is also discussed.   [name] is reminded of the importance of daily foot exam, annual eye examination, and good blood sugar, blood pressure and cholesterol control.  Diabetic Labs Latest Ref Rng & Units 06/12/2017 04/18/2017 02/28/2017 02/27/2017 02/26/2017  HbA1c <5.7 % of total Hgb 7.2(H) - - - -  Microalbumin <2.0 mg/dL - - - - -  Micro/Creat Ratio 0.0 - 30.0 mg/g - - - - -  Chol <200 mg/dL 181 - - - -  HDL >50 mg/dL 47(L) - - - -  Calc LDL <100 mg/dL - - - - -  Triglycerides <150 mg/dL 188(H) - - - -  Creatinine 0.60 - 0.93 mg/dL 1.39(H) 1.60(H) 1.27(H) 1.42(H) 1.58(H)   BP/Weight 06/17/2017 04/25/2017 04/22/2017 04/15/2017 03/31/2017 03/20/2017 40/01/1447  Systolic BP 185 631 497 026 378 588 502  Diastolic BP 80 73 77 81 774 70 78  Wt. (Lbs) 207 202 - - 202 203.08 204  BMI 33.41 32.6 - - 32.6 32.78 32.93   Foot/eye exam completion dates Latest Ref Rng & Units 04/17/2017 03/18/2017  Eye Exam No Retinopathy No Retinopathy -  Foot exam Order - - -  Foot Form Completion - - Done        Generalized anxiety disorder Controlled, no change in  medication   Hyperlipidemia LDL goal <100 Hyperlipidemia:Low fat diet discussed and encouraged.   Lipid Panel  Lab Results  Component Value Date   CHOL 181 06/12/2017   HDL 47 (L) 06/12/2017   LDLCALC 99 10/11/2016   TRIG 188 (H) 06/12/2017   CHOLHDL 3.9 06/12/2017    Needs to reduce fat intake and increase exercise commitment , not at goal,  no med change   Hypertension goal BP (blood pressure) < 130/80 Controlled, no change in medication DASH diet and commitment to daily physical activity for a minimum of 30 minutes discussed and encouraged, as a part of hypertension management. The importance of attaining a healthy weight is also discussed.  BP/Weight 06/17/2017 04/25/2017 04/22/2017 04/15/2017 03/31/2017 03/20/2017 05/18/7866  Systolic BP 672 094 709 628 366 294 765  Diastolic BP 80 73 77 81 465 70 78  Wt. (Lbs) 207 202 - - 202 203.08 204  BMI 33.41 32.6 - - 32.6 32.78 32.93

## 2017-06-17 NOTE — Patient Instructions (Addendum)
F/u in 5 months, caLL IF YOU NEED ME BEFORE  Fasting lipid, cop and EGFr, HBa!C, vitamin D 1 week before next visit  Labs are very good except cholesterol too high  Please work on weight loss   It is important that you exercise regularly at least 30 minutes 5 times a week. If you develop chest pain, have severe difficulty breathing, or feel very tired, stop exercising immediately and seek medical attention     Please work on good  health habits so that your health will improve. 1. Commitment to daily physical activity for 30 to 60  minutes, if you are able to do this.  2. Commitment to wise food choices. Aim for half of your  food intake to be vegetable and fruit, one quarter starchy foods, and one quarter protein. Try to eat on a regular schedule  3 meals per day, snacking between meals should be limited to vegetables or fruits or small portions of nuts. 64 ounces of water per day is generally recommended, unless you have specific health conditions, like heart failure or kidney failure where you will need to limit fluid intake.  3. Commitment to sufficient and a  good quality of physical and mental rest daily, generally between 6 to 8 hours per day.  WITH PERSISTANCE AND PERSEVERANCE, THE IMPOSSIBLE , BECOMES THE NORM!   Thank you  for choosing [facility]. We consider it a privelige to serve you.  Delivering excellent health care in a caring and  compassionate way is our goal.  Partnering with you,  so that together we can achieve this goal is our strategy.

## 2017-06-19 DIAGNOSIS — R0902 Hypoxemia: Secondary | ICD-10-CM | POA: Diagnosis not present

## 2017-06-22 IMAGING — CT CT RENAL STONE PROTOCOL
2 of 4 series · 16 of 46 positions shown, 18 images · non-contrast
Comparison: CT of the abdomen and pelvis from 05/18/2014

CLINICAL DATA: Chronic bilateral flank pain and lower back pain,
radiating down into both legs. Initial encounter.

EXAM:
CT ABDOMEN AND PELVIS WITHOUT CONTRAST
TECHNIQUE: Multidetector CT imaging of the abdomen and pelvis was performed
following the standard protocol without IV contrast.

[Series 2: standard/full over (age)lbs 5.0 · axial · 0.83mm/px · z∈[-442,-2]mm · 13 of 98 slices shown, 15 images]
[im 5/98  soft-tissue]
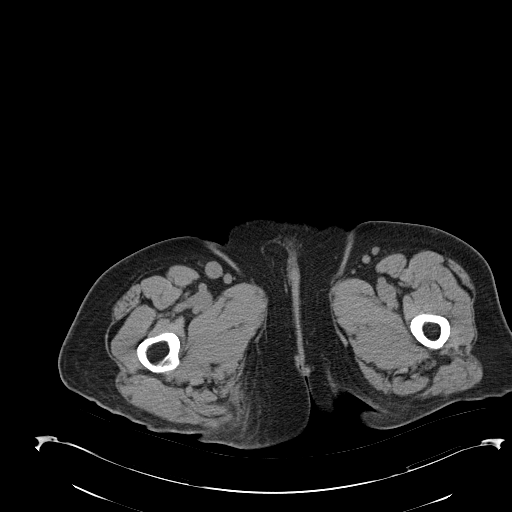
[im 5/98  bone]
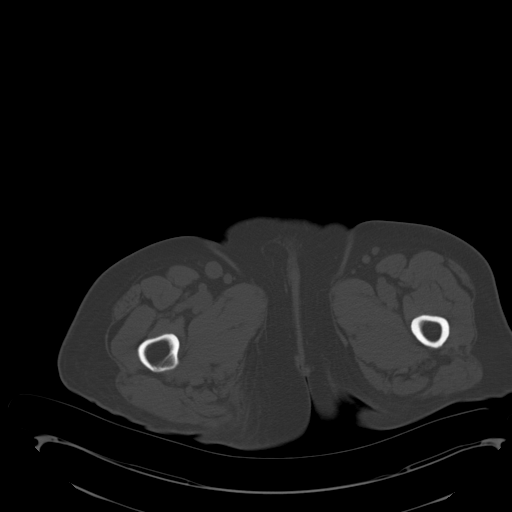
[im 13/98  soft-tissue]
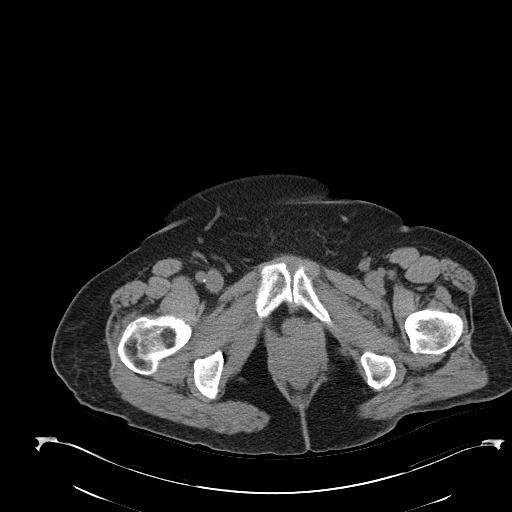
[im 22/98  soft-tissue]
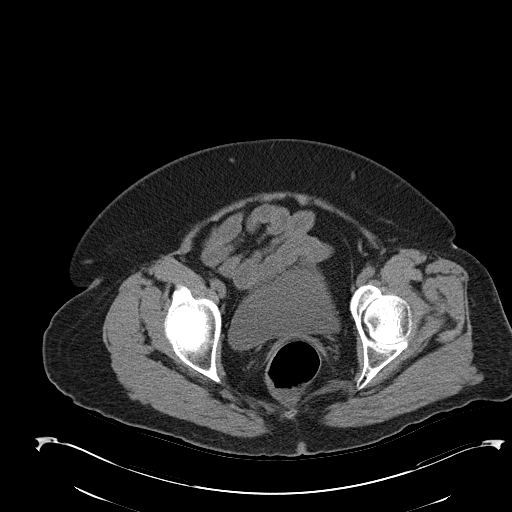
[im 26/98  soft-tissue]
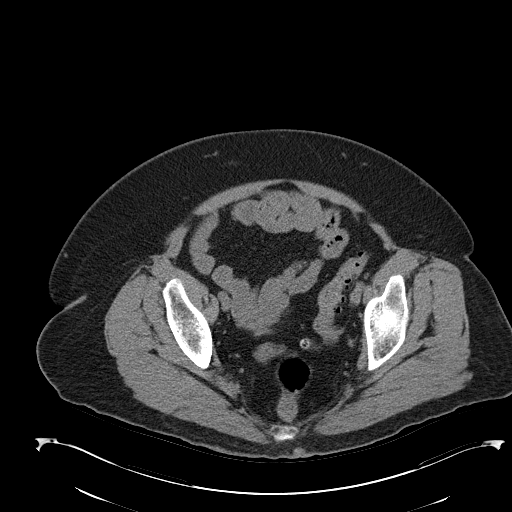
[im 34/98  soft-tissue]
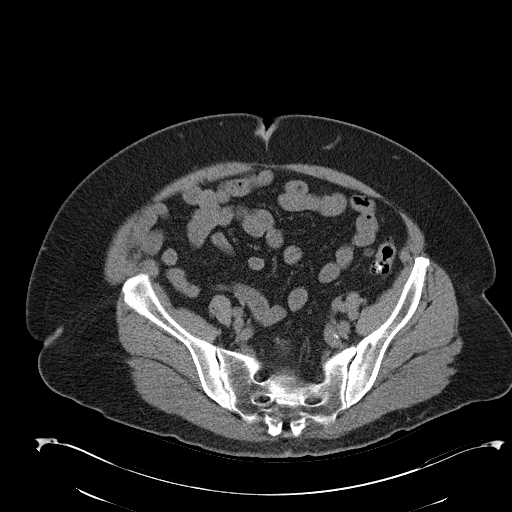
[im 43/98  soft-tissue]
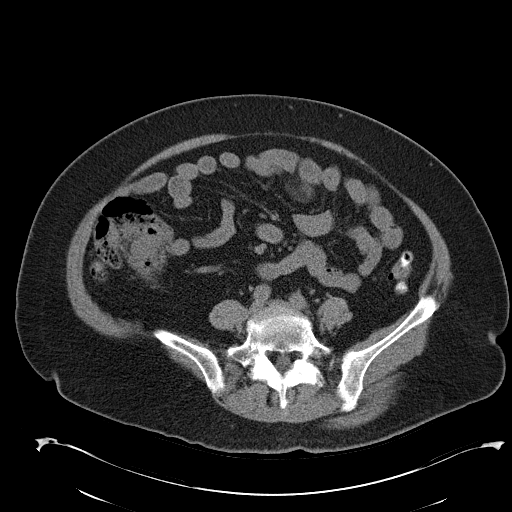
[im 51/98  soft-tissue]
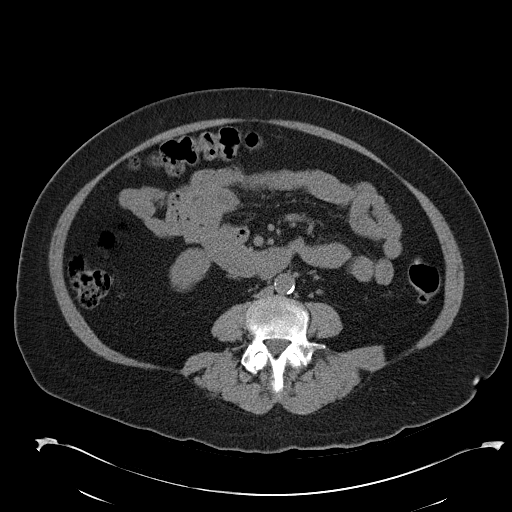
[im 55/98  soft-tissue]
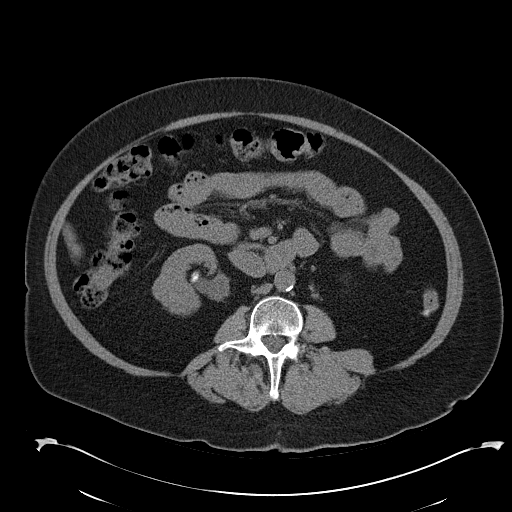
[im 64/98  soft-tissue]
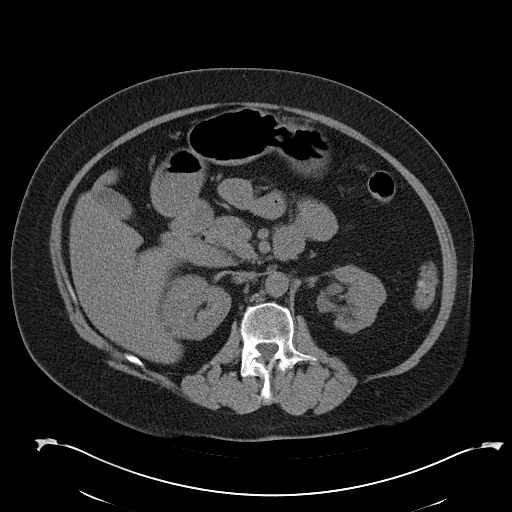
[im 64/98  bone]
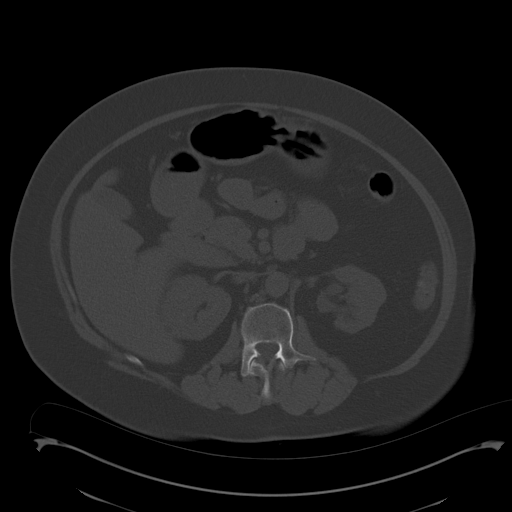
[im 72/98  soft-tissue]
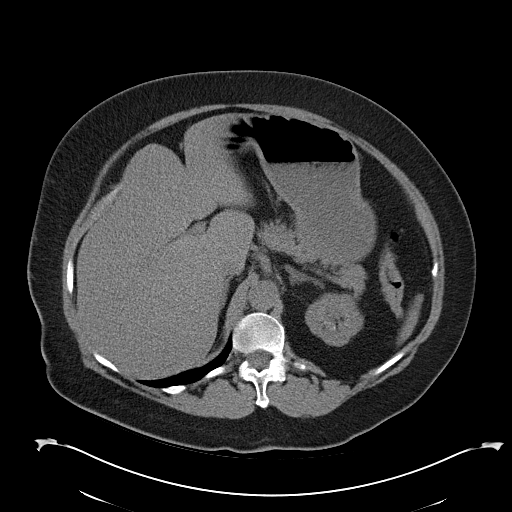
[im 76/98  soft-tissue]
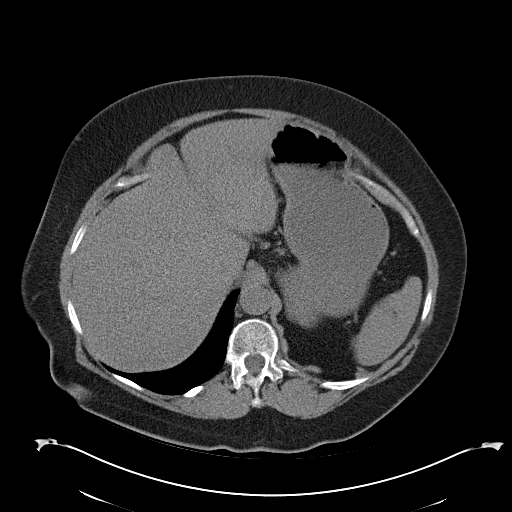
[im 85/98  soft-tissue]
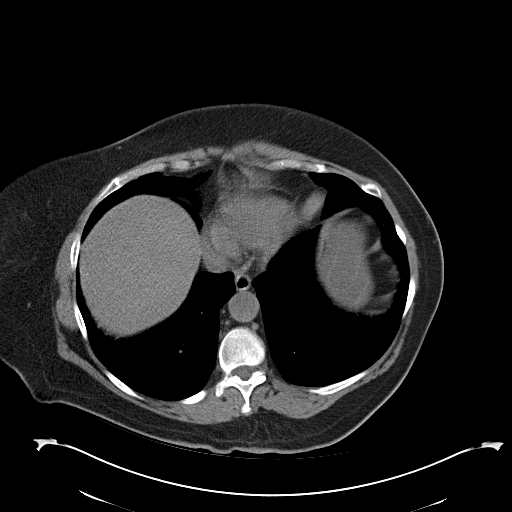
[im 93/98  soft-tissue]
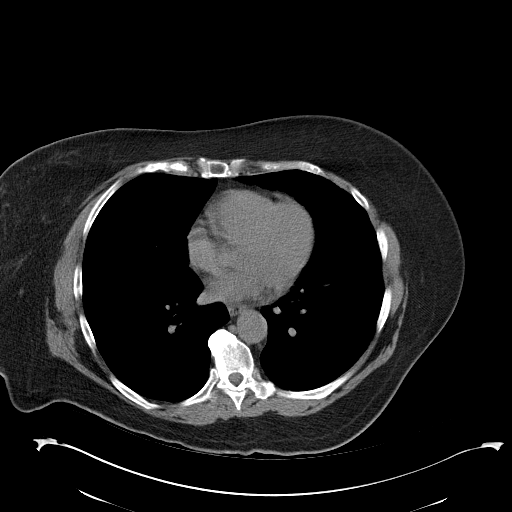

[Series 4: mpr coronal · coronal · 0.78mm/px · 3 of 98 slices shown]
[im 33/98  soft-tissue]
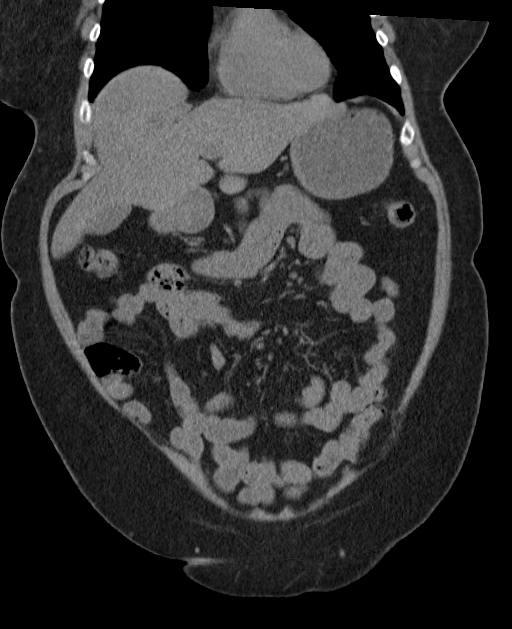
[im 44/98  soft-tissue]
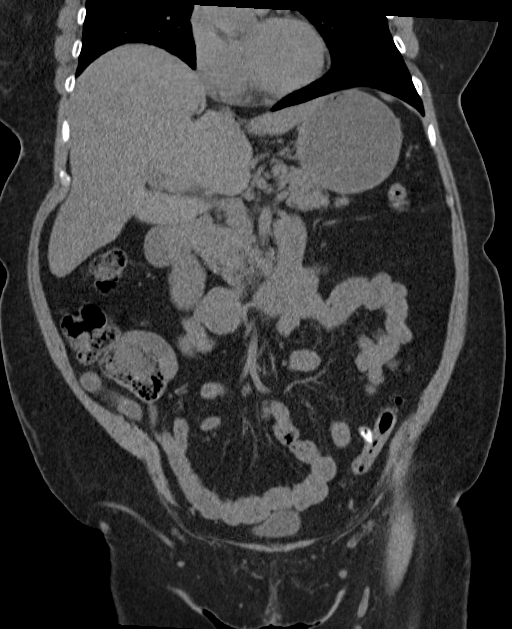
[im 54/98  soft-tissue]
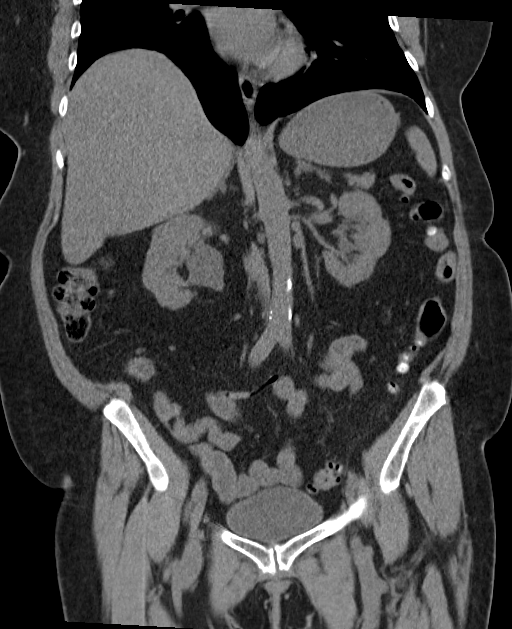

[16 of 46 positions shown; findings below may reference images not displayed]

FINDINGS: Mild right basilar atelectasis is noted.

The liver and spleen are unremarkable in appearance. The gallbladder
is within normal limits. The pancreas and adrenal glands are
unremarkable.

Scattered bilateral nonobstructing renal stones are seen, measuring
up to 1.4 cm in size. Given their distribution, some of these may
reflect underlying medullary nephrocalcinosis. Mild left renal
scarring is noted. There is no evidence of hydronephrosis. No
obstructing ureteral stones are identified at this time.

No free fluid is identified. The small bowel is unremarkable in
appearance. The stomach is within normal limits. No acute vascular
abnormalities are seen. Mild scattered calcification is noted along
the abdominal aorta and its branches.

The appendix is normal in caliber, without evidence of appendicitis.
Mild diverticulosis is noted along the descending and sigmoid colon,
without evidence of diverticulitis.

The bladder is mildly distended and grossly unremarkable. The
patient is status post hysterectomy. No suspicious adnexal masses
are seen. No inguinal lymphadenopathy is seen.

No acute osseous abnormalities are identified.
IMPRESSION: 1. No acute abnormality seen to explain the patient's symptoms.
2. Nonobstructing bilateral renal stones seen, measuring up to
cm in size. Given their distribution, some of these may reflect
underlying medullary nephrocalcinosis. Mild left renal scarring
noted.
3. Mild diverticulosis along the descending and sigmoid colon,
without evidence of diverticulitis.
4. Mild right basilar atelectasis noted.
5. Mild scattered calcification along the abdominal aorta and its
branches.

## 2017-06-27 ENCOUNTER — Ambulatory Visit: Payer: Medicare Other | Admitting: Gastroenterology

## 2017-07-09 ENCOUNTER — Encounter: Payer: Self-pay | Admitting: Family Medicine

## 2017-07-09 NOTE — Assessment & Plan Note (Signed)
Hyperlipidemia:Low fat diet discussed and encouraged.   Lipid Panel  Lab Results  Component Value Date   CHOL 181 06/12/2017   HDL 47 (L) 06/12/2017   LDLCALC 99 10/11/2016   TRIG 188 (H) 06/12/2017   CHOLHDL 3.9 06/12/2017    Needs to reduce fat intake and increase exercise commitment , not at goal,  no med change

## 2017-07-09 NOTE — Assessment & Plan Note (Signed)
Controlled, no change in medication Cheryl Abbott is reminded of the importance of commitment to daily physical activity for 30 minutes or more, as able and the need to limit carbohydrate intake to 30 to 60 grams per meal to help with blood sugar control.   The need to take medication as prescribed, test blood sugar as directed, and to call between visits if there is a concern that blood sugar is uncontrolled is also discussed.   Cheryl Abbott is reminded of the importance of daily foot exam, annual eye examination, and good blood sugar, blood pressure and cholesterol control.  Diabetic Labs Latest Ref Rng & Units 06/12/2017 04/18/2017 02/28/2017 02/27/2017 02/26/2017  HbA1c <5.7 % of total Hgb 7.2(H) - - - -  Microalbumin <2.0 mg/dL - - - - -  Micro/Creat Ratio 0.0 - 30.0 mg/g - - - - -  Chol <200 mg/dL 181 - - - -  HDL >50 mg/dL 47(L) - - - -  Calc LDL <100 mg/dL - - - - -  Triglycerides <150 mg/dL 188(H) - - - -  Creatinine 0.60 - 0.93 mg/dL 1.39(H) 1.60(H) 1.27(H) 1.42(H) 1.58(H)   BP/Weight 06/17/2017 04/25/2017 04/22/2017 04/15/2017 03/31/2017 03/20/2017 63/01/7563  Systolic BP 332 951 884 166 063 016 010  Diastolic BP 80 73 77 81 932 70 78  Wt. (Lbs) 207 202 - - 202 203.08 204  BMI 33.41 32.6 - - 32.6 32.78 32.93   Foot/eye exam completion dates Latest Ref Rng & Units 04/17/2017 03/18/2017  Eye Exam No Retinopathy No Retinopathy -  Foot exam Order - - -  Foot Form Completion - - Done

## 2017-07-09 NOTE — Assessment & Plan Note (Signed)
Increased symptoms with c//o left foot and ankle pain, I explained that pain is radiaiting from nerves in her spine , offered PT, refused stating that she has done that In the past and it causes worsening of her symptoms

## 2017-07-09 NOTE — Assessment & Plan Note (Signed)
Controlled, no change in medication DASH diet and commitment to daily physical activity for a minimum of 30 minutes discussed and encouraged, as a part of hypertension management. The importance of attaining a healthy weight is also discussed.  BP/Weight 06/17/2017 04/25/2017 04/22/2017 04/15/2017 03/31/2017 03/20/2017 42/11/8339  Systolic BP 962 229 798 921 194 174 081  Diastolic BP 80 73 77 81 448 70 78  Wt. (Lbs) 207 202 - - 202 203.08 204  BMI 33.41 32.6 - - 32.6 32.78 32.93

## 2017-07-09 NOTE — Assessment & Plan Note (Signed)
Controlled, no change in medication  

## 2017-07-15 DIAGNOSIS — M79671 Pain in right foot: Secondary | ICD-10-CM | POA: Diagnosis not present

## 2017-07-15 DIAGNOSIS — M25579 Pain in unspecified ankle and joints of unspecified foot: Secondary | ICD-10-CM | POA: Diagnosis not present

## 2017-07-15 DIAGNOSIS — M79672 Pain in left foot: Secondary | ICD-10-CM | POA: Diagnosis not present

## 2017-07-17 ENCOUNTER — Ambulatory Visit (INDEPENDENT_AMBULATORY_CARE_PROVIDER_SITE_OTHER): Payer: Medicare Other | Admitting: Gastroenterology

## 2017-07-17 ENCOUNTER — Encounter: Payer: Self-pay | Admitting: Gastroenterology

## 2017-07-17 DIAGNOSIS — K219 Gastro-esophageal reflux disease without esophagitis: Secondary | ICD-10-CM

## 2017-07-17 NOTE — Patient Instructions (Signed)
DRINK WATER TO KEEP YOUR URINE LIGHT YELLOW.   AVOID SWEET TEA, SODAS, AND FRUIT JUICE.  Do not drink DIET SODA, AVOID HIGH FRUCTOSE CORN SYRUP, chew SUGAR FREE GUM, OR USE ARTIFICIAL SWEETENERS. USE STEVIA AS A SWEETENER OR AGAVE NECTAR.  CONTINUE YOUR WEIGHT LOSS EFFORTS. 1/2 CUP ICE CREAM, ONLY AT NIGHT AND ONLY 2 LITTLE DEBBIE CAKES.   WORK OUT IN YOUR CHAIR WITH HAND WEIGHTS. START WITH 1 LB AND WORK YOUR WAY UP TO 5 LBS. SEE HANDOUT.   FOLLOW UP IN 6 MOS.

## 2017-07-17 NOTE — Assessment & Plan Note (Signed)
SYMPTOMS FAIRLY WELL CONTROLLED.  CONTINUE TO MONITOR SYMPTOMS. CONTINUE PROTONIX. TAKE 30 MINUTES PRIOR TO MEALS TWICE DAILY. FOLLOW UP IN 6 MOS.

## 2017-07-17 NOTE — Assessment & Plan Note (Signed)
WEIGHT UP 11 LBS SINCE 2017.  DRINK WATER TO KEEP YOUR URINE LIGHT YELLOW.  AVOID SWEET TEA, SODAS, AND FRUIT JUICE. Do not drink DIET SODA, AVOID HIGH FRUCTOSE CORN SYRUP, chew SUGAR FREE GUM, OR USE ARTIFICIAL SWEETENERS. USE STEVIA AS A SWEETENER OR AGAVE NECTAR. CONTINUE YOUR WEIGHT LOSS EFFORTS. 1/2 CUP ICE CREAM, ONLY AT NIGHT AND ONLY 2 LITTLE DEBBIE CAKES. WORK OUT IN YOUR CHAIR WITH HAND WEIGHTS. START WITH 1 LB AND WORK YOUR WAY UP TO 5 LBS.  HANDOUT GIVEN. FOLLOW UP IN 6 MOS.

## 2017-07-17 NOTE — Progress Notes (Signed)
Subjective:    Patient ID: Cheryl Abbott, female    DOB: 29-Nov-1945, 72 y.o.   MRN: [id number]  [name], MD  HPI CONCERNED SHE HAS A LARGER ABDOMEN. EATS CHIPS/COOKIES SOMETIMES. MAY DRINK SODA ONCE A DAY. THINKING ABOUT CHANGING DIET TO CUT BACK. NO CHEW GUM OR DIET SODA. GOES OUT A GETS SWEET TEA. EATS ICE CREAM ALMOST EVERY NIGHT. LOVES LITTLE DEBBIE CAKES 3 AND STRAWBERRY CHEESECAKE ICE CREAM. TAKES COLACE TO AVOID CONSTIPATION. IF GETS UP EARLY GETS NAUSEA, BUT SLEEPS LATE. FEELS LIKE MUCOUS IN BACK OF THROAT. HEARTBURN HELPED WITH COKE OR PEPSI AND MAKES HER BURP. MAY HAVE SOB. MAY GET DIARRHEA/INCONTINENCE IF SOMETHING GETS ON HER NERVES OR GETS HER UPSET. BLACK STOOLS ON PO IRON.  PT DENIES FEVER, CHILLS, HEMATOCHEZIA, vomiting, melena,CHEST PAIN, CHANGE IN BOWEL IN HABITS, abdominal pain, OR problems swallowing.  Past Medical History:  Diagnosis Date  . Anemia   . Anxiety   . Anxiety disorder   . Arthritis   . Cancer ([place])   . 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 Chronic back pain   . Depression   . Fatty liver   . GERD (gastroesophageal reflux disease)   . Headache   . History of adenomatous polyp of colon    2008  . History of kidney stones   . Hypercalcemia   . Hyperlipidemia   . Hypertension   . Left ureteral stone   . Lipoma of arm 01/19/2015  . Lumbar disc disease with radiculopathy   . Nephrolithiasis   . Neuropathy, peripheral   . OSA treated with BiPAP    per study 2007  . Sciatica   . Sigmoid diverticulosis   . Type 2 diabetes mellitus ([place])     Past Surgical History:  Procedure Laterality Date  . BIOPSY N/A 03/17/2013   Procedure: GASTRIC BIOPSIES;  Surgeon: Danie Binder, MD;  Location: [facility];  Service: Endoscopy;  Laterality: N/A;  . BIOPSY  06/10/2015   Procedure: BIOPSY;  Surgeon: Danie Binder, MD;  [facility];  Service: Endoscopy;;  gastric biopsy  . CARDIAC CATHETERIZATION  05-11-2003  dr Shelva Majestic  ([facility])   Abnormal cardiolite/   Normal coronary arteries and normal LVF,  ef  63%  . CARDIOVASCULAR STRESS TEST  01-01-2014   normal lexiscan cardiolite/  no ischemia/ infarct/  normal LV function and wall motion ,  ef 81%  . COLONOSCOPY  last one 10/02/2011   [facility] TICS, IH, NEXT TCS APR 2018  . COLONOSCOPY WITH PROPOFOL N/A 02/26/2017   Procedure: COLONOSCOPY WITH PROPOFOL;  Surgeon: Danie Binder, MD;  [facility];  Service: Endoscopy;  Laterality: N/A;  11:00am  . CYSTO/   RIGHT URETEROSOCOPY LASER LITHOTRIPSY STONE EXTRACTION  10-13-2004  . CYSTO/  RIGHT RETROGRADE PYELOGRAM/  PLACMENT RIGHT URETERAL STENT  01-10-2010  . CYSTOSCOPY W/ URETERAL STENT PLACEMENT Right 08/19/2015   Procedure: CYSTOSCOPY WITH RETROGRADE PYELOGRAM/URETERAL STENT PLACEMENT;  Surgeon: Franchot Gallo, MD;  Location: [facility];  Service: Urology;  Laterality: Right;  . CYSTOSCOPY WITH RETROGRADE PYELOGRAM, URETEROSCOPY AND STENT PLACEMENT Left 10/21/2014   Procedure: CYSTOSCOPY WITH RETROGRADE PYELOGRAM, URETEROSCOPY AND STENT PLACEMENT;  Surgeon: Franchot Gallo, MD;  Location: [facility];  Service: Urology;  Laterality: Left;  . CYSTOSCOPY WITH RETROGRADE PYELOGRAM, URETEROSCOPY AND STENT PLACEMENT Right 11/22/2015   Procedure: CYSTOSCOPY, RIGHT URETERAL STENT REMOVAL; RIGHT RETROGRADE PYELOGRAM, RIGHT URETEROSCOPY WITH BALLOON DILATION; RIGHT URETERAL STENT PLACEMENT;  Surgeon: Franchot Gallo, MD;  Location: [facility];  Service: Urology;  Laterality: Right;  . CYSTOSCOPY/RETROGRADE/URETEROSCOPY/STONE EXTRACTION WITH BASKET Bilateral 08/02/2015   Procedure: CYSTOSCOPY; BILATERAL RETROGRADE PYELOGRAMS; BILATERAL URETEROSCOPY, STONE EXTRACTION WITH BASKET;  Surgeon: Franchot Gallo, MD;  Location: [facility];  Service: Urology;  Laterality: Bilateral;  . CYSTOSCOPY/RETROGRADE/URETEROSCOPY/STONE EXTRACTION WITH BASKET Right 06/28/2015   Procedure:  CYSTOSCOPY/RETROGRADE/URETEROSCOPY/STONE EXTRACTION WITH BASKET, RIGHT URETERAL DOUBLE J STENT PLACEMENT;  Surgeon: Franchot Gallo, MD;  Location: [facility];  Service: Urology;  Laterality: Right;  . ESOPHAGOGASTRODUODENOSCOPY (EGD) WITH PROPOFOL N/A 03/17/2013   Procedure: ESOPHAGOGASTRODUODENOSCOPY (EGD) WITH PROPOFOL;  Surgeon: Danie Binder, MD;  Location: [facility];  Service: Endoscopy;  Laterality: N/A;  . ESOPHAGOGASTRODUODENOSCOPY (EGD) WITH PROPOFOL N/A 06/10/2015   Distal gastritis, distal esophageal stricture s/p dilation  . ESOPHAGOGASTRODUODENOSCOPY (EGD) WITH PROPOFOL N/A 02/26/2017   Procedure: ESOPHAGOGASTRODUODENOSCOPY (EGD) WITH PROPOFOL;  Surgeon: Danie Binder, MD;  [facility];  Service: Endoscopy;  Laterality: N/A;  . FLEXIBLE SIGMOIDOSCOPY  09/11/2011   [id number] Internal hemorrhoids  . HOLMIUM LASER APPLICATION Left 02/24/3845   Procedure: HOLMIUM LASER APPLICATION;  Surgeon: Franchot Gallo, MD;  Location[facility];  Service: Urology;  Laterality: Left;  . HOLMIUM LASER APPLICATION Right 6/59/9357   Procedure: HOLMIUM LASER APPLICATION;  Surgeon: Franchot Gallo, MD;  Location: [facility];  Service: Urology;  Laterality: Right;  . MASS EXCISION Left 03/04/2015   Procedure: EXCISION OF SOFT TISSUE NEOPLASM LEFT ARM;  Surgeon: Aviva Signs, MD;  Location: [facility];  Service: General;  Laterality: Left;  . PERCUTANEOUS NEPHROSTOLITHOTOMY Bilateral 12/  2011     [facility]  . POLYPECTOMY N/A 03/17/2013   Procedure: GASTRIC POLYPECTOMY;  Surgeon: Danie Binder, MD;  Location: [facility];  Service: Endoscopy;  Laterality: N/A;  . POLYPECTOMY  02/26/2017   Procedure: POLYPECTOMY;  Surgeon: Danie Binder, MD;  [facility];  Service: Endoscopy;;  polyp at cecum, ascending colon polyps x3, hepatic flexure polyps x8, transverse colon polyps x8   . REMOVAL RIGHT THIGH CYST  2006  . SAVORY DILATION N/A 03/17/2013   Procedure: SAVORY DILATION;  Surgeon:  Danie Binder, MD;  Location: [facility];  Service: Endoscopy;  Laterality: N/A;  #12.8, 14, 15, 16 dilators used  . SAVORY DILATION N/A 06/10/2015   Procedure: SAVORY DILATION;  Surgeon: Danie Binder, MD;  [facility];  Service: Endoscopy;  Laterality: N/A;  . TRANSTHORACIC ECHOCARDIOGRAM  01-01-2014   mild LVH/  ef 01-77%/  grade I diastolic dysfunction/  trivial MR, TR, and PR  . VAGINAL HYSTERECTOMY  1970's   Allergies  Allergen Reactions  . Ace Inhibitors Cough  . Keflex [Cephalexin] Diarrhea and Nausea And Vomiting  . Nitrofurantoin Diarrhea and Nausea And Vomiting  . Penicillins Hives and Other (See Comments)       Current Outpatient Medications  Medication Sig Dispense Refill  . ALPRAZolam (XANAX) 0.5 MG tablet Take 1 tablet (0.5 mg total) by mouth 2 (two) times daily.    Marland Kitchen amLODipine (NORVASC) 10 MG tablet Take 1 tablet (10 mg total) by mouth daily.    Marland Kitchen aspirin EC 325 MG tablet Take 325 mg by mouth daily.    . Cholecalciferol (VITAMIN D3) 5000 units CAPS Take 1 capsule by mouth 2 (two) times daily.    . clopidogrel (PLAVIX) 75 MG tablet Take 75 mg by mouth daily.    . Cyanocobalamin (B-12 PO) Take 1 tablet by mouth daily.    Marland Kitchen  diphenhydramine-acetaminophen (TYLENOL PM) 25-500 MG TABS tablet Take 2 tablets by mouth at bedtime.    . docusate sodium (COLACE) 100 MG capsule Take 200 mg by mouth at bedtime.     . iron polysaccharides (NIFEREX) 150 MG capsule Take 150 mg by mouth daily.    Marland Kitchen lovastatin (MEVACOR) 40 MG tablet TAKE 2 TABLETS (80 MG TOTAL) BY MOUTH AT BEDTIME.    Marland Kitchen OXYGEN Inhale into the lungs daily. Bedtime    . pantoprazole (PROTONIX) 40 MG tablet TAKE 1 TABLET (40 MG TOTAL) BY MOUTH 2 (TWO) TIMES DAILY.    Marland Kitchen PARoxetine (PAXIL) 40 MG tablet Take 1 tablet (40 mg total) by mouth every morning.    . TRADJENTA 5 MG TABS tablet TAKE 1 TABLET BY MOUTH EVERY DAY    . triamcinolone cream (KENALOG) 0.1 % Apply 1 application topically 3 (three) times daily.  (Patient taking differently: Apply 1 application topically 3 (three) times daily as needed. )     Review of Systems PER HPI OTHERWISE ALL SYSTEMS ARE NEGATIVE.     Objective:   Physical Exam  Constitutional: She is oriented to person, place, and time. She appears well-developed and well-nourished. No distress.  HENT:  Head: Normocephalic and atraumatic.  Mouth/Throat: Oropharynx is clear and moist. No oropharyngeal exudate.  Eyes: Pupils are equal, round, and reactive to light. No scleral icterus.  Neck: Normal range of motion. Neck supple.  Cardiovascular: Normal rate, regular rhythm and normal heart sounds.  Pulmonary/Chest: Effort normal and breath sounds normal. No respiratory distress.  Abdominal: Soft. Bowel sounds are normal. She exhibits no distension. There is no tenderness.  Musculoskeletal: She exhibits no edema.  WALKS ASSISTED WITH A CANE.  Lymphadenopathy:    She has no cervical adenopathy.  Neurological: She is alert and oriented to person, place, and time.  NO  NEW FOCAL DEFICITS  Psychiatric:  FLAT AFFECT, NL MOOD  Vitals reviewed.     Assessment & Plan:

## 2017-07-17 NOTE — Progress Notes (Signed)
ON RECALL  °

## 2017-07-17 NOTE — Progress Notes (Signed)
CC'D TO PCP °

## 2017-07-19 IMAGING — DX DG ABDOMEN 1V
1 series · 1 of 1 positions shown · non-contrast
Comparison: Abdominal CT 01/11/2015

CLINICAL DATA: Kidney stones and back pain

EXAM:
ABDOMEN - 1 VIEW

[abdomen supine]
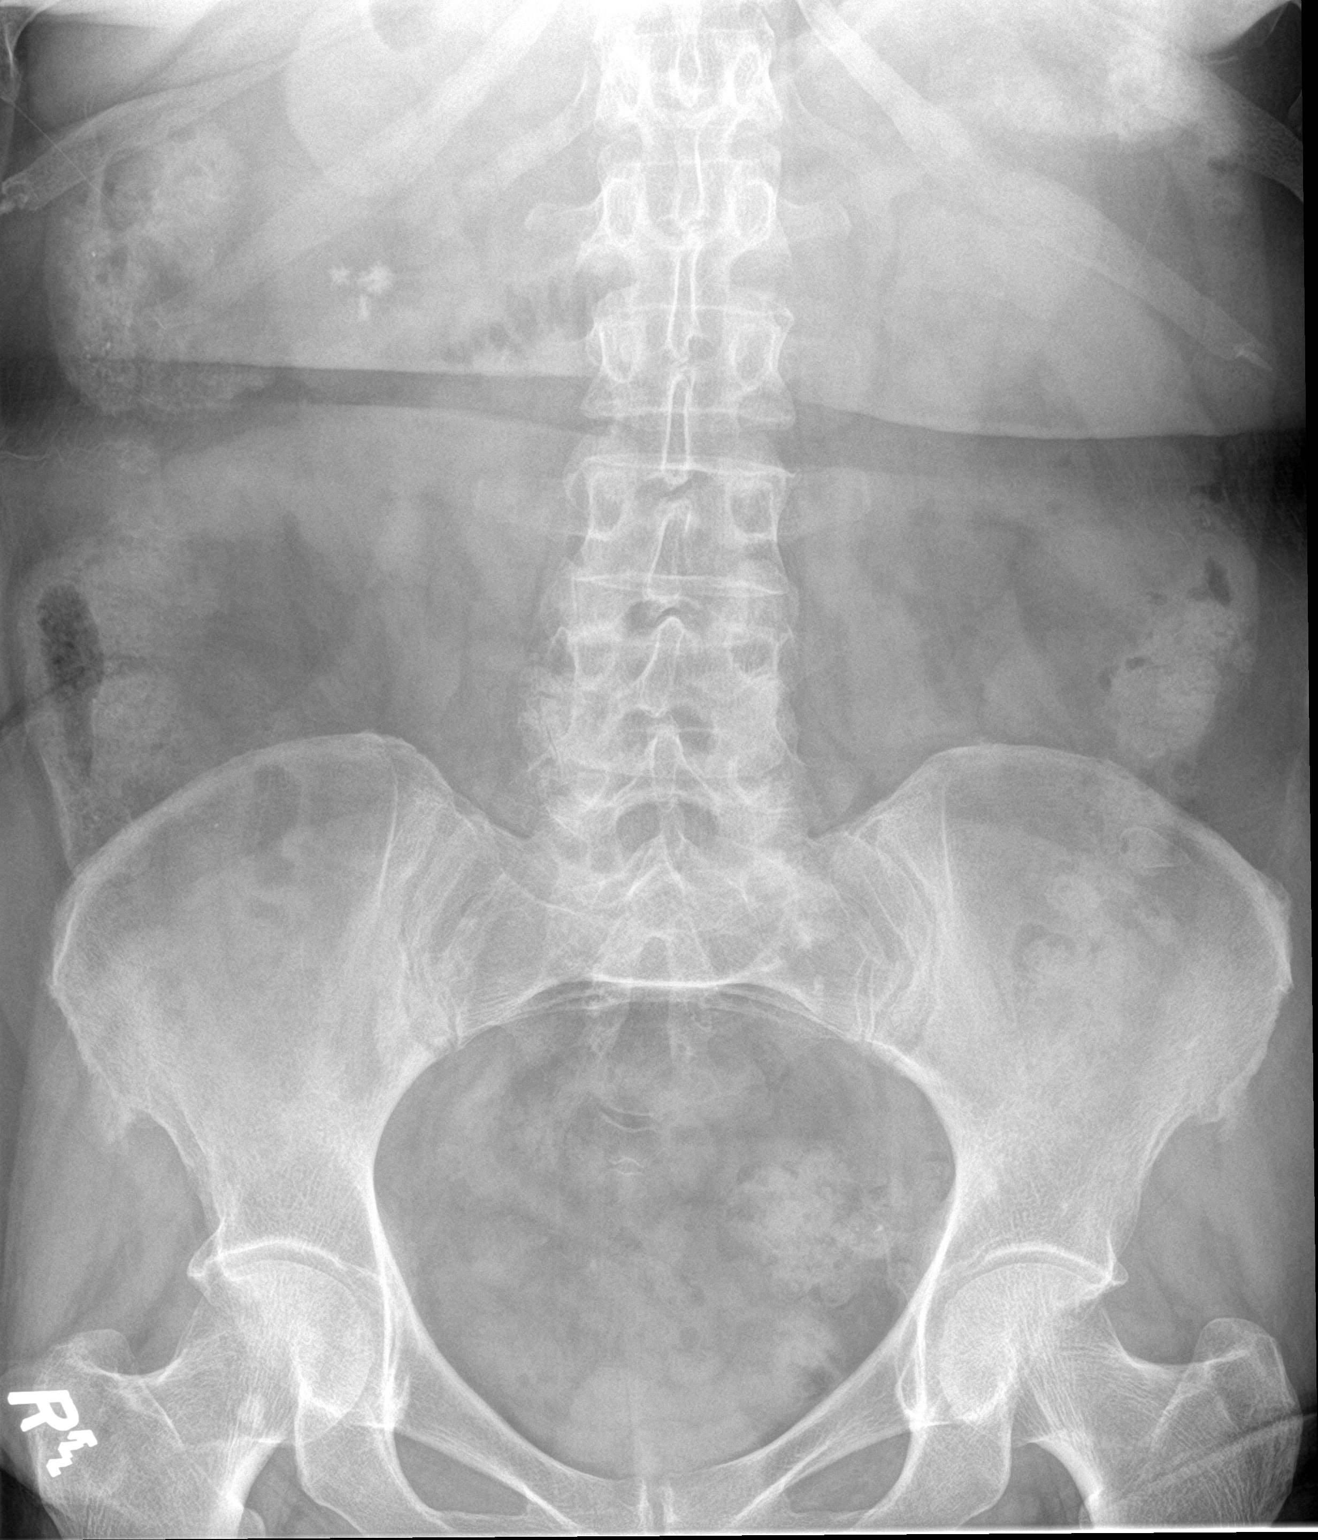

[1 of 1 positions shown; findings below may reference images not displayed]

FINDINGS: Bilateral nephrolithiasis with 3 stones clustered in the right lower
pole measuring up to 11 mm individually. Left nephrolithiasis is
less well demonstrated compared to previous CT, with punctate
calculi seen over the upper pole. No evidence of ureteral stone.

Normal bowel gas pattern. No concerning intra-abdominal mass effect.
IMPRESSION: Bilateral nephrolithiasis with largest stones at the right lower
pole. No evidence of ureteral calculus.

## 2017-07-20 DIAGNOSIS — R0902 Hypoxemia: Secondary | ICD-10-CM | POA: Diagnosis not present

## 2017-07-31 ENCOUNTER — Other Ambulatory Visit (HOSPITAL_COMMUNITY): Payer: Self-pay | Admitting: *Deleted

## 2017-07-31 DIAGNOSIS — C9000 Multiple myeloma not having achieved remission: Secondary | ICD-10-CM

## 2017-08-01 ENCOUNTER — Other Ambulatory Visit (HOSPITAL_COMMUNITY): Payer: Medicare Other

## 2017-08-01 ENCOUNTER — Ambulatory Visit (HOSPITAL_COMMUNITY): Payer: Medicare Other | Admitting: Internal Medicine

## 2017-08-02 ENCOUNTER — Inpatient Hospital Stay (HOSPITAL_COMMUNITY): Payer: Medicare Other | Attending: Oncology

## 2017-08-02 ENCOUNTER — Inpatient Hospital Stay (HOSPITAL_BASED_OUTPATIENT_CLINIC_OR_DEPARTMENT_OTHER): Payer: Medicare Other | Admitting: Oncology

## 2017-08-02 ENCOUNTER — Encounter (HOSPITAL_COMMUNITY): Payer: Self-pay | Admitting: Oncology

## 2017-08-02 ENCOUNTER — Other Ambulatory Visit: Payer: Self-pay

## 2017-08-02 VITALS — BP 103/67 | HR 89 | Temp 98.3°F | Resp 16 | Ht 66.0 in | Wt 208.0 lb

## 2017-08-02 DIAGNOSIS — M5116 Intervertebral disc disorders with radiculopathy, lumbar region: Secondary | ICD-10-CM | POA: Diagnosis not present

## 2017-08-02 DIAGNOSIS — Z87442 Personal history of urinary calculi: Secondary | ICD-10-CM

## 2017-08-02 DIAGNOSIS — F419 Anxiety disorder, unspecified: Secondary | ICD-10-CM | POA: Diagnosis not present

## 2017-08-02 DIAGNOSIS — R5383 Other fatigue: Secondary | ICD-10-CM

## 2017-08-02 DIAGNOSIS — G4730 Sleep apnea, unspecified: Secondary | ICD-10-CM

## 2017-08-02 DIAGNOSIS — M129 Arthropathy, unspecified: Secondary | ICD-10-CM | POA: Diagnosis not present

## 2017-08-02 DIAGNOSIS — Z8719 Personal history of other diseases of the digestive system: Secondary | ICD-10-CM | POA: Insufficient documentation

## 2017-08-02 DIAGNOSIS — Z7982 Long term (current) use of aspirin: Secondary | ICD-10-CM | POA: Insufficient documentation

## 2017-08-02 DIAGNOSIS — E785 Hyperlipidemia, unspecified: Secondary | ICD-10-CM

## 2017-08-02 DIAGNOSIS — G629 Polyneuropathy, unspecified: Secondary | ICD-10-CM | POA: Insufficient documentation

## 2017-08-02 DIAGNOSIS — K760 Fatty (change of) liver, not elsewhere classified: Secondary | ICD-10-CM | POA: Diagnosis not present

## 2017-08-02 DIAGNOSIS — Z87891 Personal history of nicotine dependence: Secondary | ICD-10-CM | POA: Diagnosis not present

## 2017-08-02 DIAGNOSIS — W19XXXA Unspecified fall, initial encounter: Secondary | ICD-10-CM

## 2017-08-02 DIAGNOSIS — C9001 Multiple myeloma in remission: Secondary | ICD-10-CM | POA: Diagnosis not present

## 2017-08-02 DIAGNOSIS — C9000 Multiple myeloma not having achieved remission: Secondary | ICD-10-CM

## 2017-08-02 DIAGNOSIS — I10 Essential (primary) hypertension: Secondary | ICD-10-CM

## 2017-08-02 DIAGNOSIS — F329 Major depressive disorder, single episode, unspecified: Secondary | ICD-10-CM | POA: Diagnosis not present

## 2017-08-02 DIAGNOSIS — G8929 Other chronic pain: Secondary | ICD-10-CM | POA: Insufficient documentation

## 2017-08-02 DIAGNOSIS — R2681 Unsteadiness on feet: Secondary | ICD-10-CM | POA: Insufficient documentation

## 2017-08-02 DIAGNOSIS — D649 Anemia, unspecified: Secondary | ICD-10-CM

## 2017-08-02 DIAGNOSIS — Z79899 Other long term (current) drug therapy: Secondary | ICD-10-CM | POA: Insufficient documentation

## 2017-08-02 DIAGNOSIS — Z9181 History of falling: Secondary | ICD-10-CM | POA: Insufficient documentation

## 2017-08-02 DIAGNOSIS — K219 Gastro-esophageal reflux disease without esophagitis: Secondary | ICD-10-CM | POA: Diagnosis not present

## 2017-08-02 DIAGNOSIS — M549 Dorsalgia, unspecified: Secondary | ICD-10-CM

## 2017-08-02 DIAGNOSIS — D509 Iron deficiency anemia, unspecified: Secondary | ICD-10-CM | POA: Insufficient documentation

## 2017-08-02 DIAGNOSIS — E119 Type 2 diabetes mellitus without complications: Secondary | ICD-10-CM | POA: Insufficient documentation

## 2017-08-02 DIAGNOSIS — D890 Polyclonal hypergammaglobulinemia: Secondary | ICD-10-CM

## 2017-08-02 DIAGNOSIS — Z86010 Personal history of colonic polyps: Secondary | ICD-10-CM

## 2017-08-02 DIAGNOSIS — G459 Transient cerebral ischemic attack, unspecified: Secondary | ICD-10-CM

## 2017-08-02 DIAGNOSIS — Z8673 Personal history of transient ischemic attack (TIA), and cerebral infarction without residual deficits: Secondary | ICD-10-CM

## 2017-08-02 DIAGNOSIS — D472 Monoclonal gammopathy: Secondary | ICD-10-CM

## 2017-08-02 LAB — CBC WITH DIFFERENTIAL/PLATELET
Basophils Absolute: 0 10*3/uL (ref 0.0–0.1)
Basophils Relative: 0 %
Eosinophils Absolute: 0.2 10*3/uL (ref 0.0–0.7)
Eosinophils Relative: 2 %
HCT: 42.3 % (ref 36.0–46.0)
Hemoglobin: 13.8 g/dL (ref 12.0–15.0)
Lymphocytes Relative: 21 %
Lymphs Abs: 1.6 10*3/uL (ref 0.7–4.0)
MCH: 31.3 pg (ref 26.0–34.0)
MCHC: 32.6 g/dL (ref 30.0–36.0)
MCV: 95.9 fL (ref 78.0–100.0)
Monocytes Absolute: 0.5 10*3/uL (ref 0.1–1.0)
Monocytes Relative: 6 %
Neutro Abs: 5.7 10*3/uL (ref 1.7–7.7)
Neutrophils Relative %: 71 %
Platelets: 265 10*3/uL (ref 150–400)
RBC: 4.41 MIL/uL (ref 3.87–5.11)
RDW: 17 % — ABNORMAL HIGH (ref 11.5–15.5)
WBC: 8 10*3/uL (ref 4.0–10.5)

## 2017-08-02 LAB — COMPREHENSIVE METABOLIC PANEL
ALT: 23 U/L (ref 14–54)
AST: 26 U/L (ref 15–41)
Albumin: 3.9 g/dL (ref 3.5–5.0)
Alkaline Phosphatase: 96 U/L (ref 38–126)
Anion gap: 12 (ref 5–15)
BUN: 15 mg/dL (ref 6–20)
CO2: 23 mmol/L (ref 22–32)
Calcium: 9.9 mg/dL (ref 8.9–10.3)
Chloride: 100 mmol/L — ABNORMAL LOW (ref 101–111)
Creatinine, Ser: 1.75 mg/dL — ABNORMAL HIGH (ref 0.44–1.00)
GFR calc Af Amer: 32 mL/min — ABNORMAL LOW (ref >60)
GFR calc non Af Amer: 28 mL/min — ABNORMAL LOW (ref >60)
Glucose, Bld: 210 mg/dL — ABNORMAL HIGH (ref 65–99)
Potassium: 3.4 mmol/L — ABNORMAL LOW (ref 3.5–5.1)
Sodium: 135 mmol/L (ref 135–145)
Total Bilirubin: 0.3 mg/dL (ref 0.3–1.2)
Total Protein: 8.8 g/dL — ABNORMAL HIGH (ref 6.5–8.1)

## 2017-08-02 LAB — IRON AND TIBC
Iron: 70 ug/dL (ref 28–170)
Saturation Ratios: 19 % (ref 10.4–31.8)
TIBC: 360 ug/dL (ref 250–450)
UIBC: 290 ug/dL

## 2017-08-02 LAB — FERRITIN: Ferritin: 43 ng/mL (ref 11–307)

## 2017-08-02 NOTE — Progress Notes (Signed)
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  HISTORY OF PRESENTING ILLNESS: Patient presents for evaluation of M spike in urine and serum. Patient has been seen by her nephrologist for continued follow-up for chronic kidney disease. 24 hour urine IFE which demonstrated Bence-Jones protein positive; kappa type. Immunofixation showed IgG monoclonal proteins with Light chain specificity. Urine M spike was 8.3 mg per 24 hours. Urine light chains excess 670 mg. Serum immunofixation also showed IgG monoclonal protein with light chain specificity. SPEP demonstrated M spike of 1.5, free kappa light chains 36.8, free lambda light chains 18.5, free lambda/kappa light chain ratio 1.99.   Creatinine 1.75 today and calcium 9.9  Most recent skeletal survey from 02/2017 was negative for lytic lesions. Most recent bone marrow biopsy on 02/19/2017 demonstrated bone marrow with trilineage hematopoiesis and plasma cells representing 14 percent of all cells with occasional atypical forms.  Immunohistochemical stains were performed and highlighted the plasma cell component in relatively limited bone marrow areas which appear to show kappa light chain excess. Cytogenetics revealed normal female karyotype with no observable clonal chromosomal abnormalities.   INTERVAL HISTORY: Today patient presents with daughter with complaints of feeling significantly fatigued. States she sleeps for approximately 14-16 hours daily. She states "I wake up and drink my coffee and go right back to sleep. I do not have  the stamina or strength to take down my Christmas tree and it's February".  Her appetite is 50%. Patient additionally states that since her fall at the end of last year she is very concerned about falling again. She had physical therapy in the past which seemed to help with her mobility, stability and confidence. She has not fallen since. Her daughter is very concerned about her overall fatigue and the lack of "wanting to do anything".She continues to deny any chest pain or shortness of breath, nausea or vomiting, bony pain or urinary issues.   She continues to take 1 iron tablet daily. She last received IV Feraheme on 11/5 in 04/22/2017. Hemoglobin today is 13.8 which is up from 11.6 in November.  Review of Systems  Constitutional: Positive for fatigue. Negative for appetite change, chills, diaphoresis, fever and unexpected weight change.  HENT:   Negative for nosebleeds, sore throat and trouble swallowing.   Eyes: Negative.   Respiratory: Negative.  Negative for cough, shortness of breath and wheezing.   Cardiovascular: Positive for leg swelling (on occasion). Negative for chest pain.  Gastrointestinal: Negative for abdominal pain, blood in stool, constipation, diarrhea, nausea and vomiting.  Endocrine: Negative.   Genitourinary: Negative.  Negative for bladder incontinence, hematuria and nocturia.   Musculoskeletal: Negative.  Negative for back pain and flank pain.  Skin: Negative.   Neurological: Negative.  Negative for dizziness, headaches, light-headedness and numbness.  Hematological: Negative.   Psychiatric/Behavioral: Negative.  Negative for confusion. The patient is not nervous/anxious.    ROS as per HPI otherwise 12 point ROS is negative.   MEDICAL HISTORY: Past Medical History:  Diagnosis Date  . Anemia   . Anxiety   . Anxiety disorder   . Arthritis   . Cancer ([place])   . 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  Chronic back pain   . Depression   . Fatty liver   . GERD  (gastroesophageal reflux disease)   . Headache   . History of adenomatous polyp of colon    2008  . History of kidney stones   . Hypercalcemia   . Hyperlipidemia   . Hypertension   . Left ureteral stone   . Lipoma of arm 01/19/2015  . Lumbar disc disease with radiculopathy   . Nephrolithiasis   . Neuropathy, peripheral   . OSA treated with BiPAP    per study 2007  . Sciatica   . Sigmoid diverticulosis   . Type 2 diabetes mellitus ([place])     SURGICAL HISTORY: Past Surgical History:  Procedure Laterality Date  . BIOPSY N/A 03/17/2013   Procedure: GASTRIC BIOPSIES;  Surgeon: Danie Binder, MD;  Location: [facility];  Service: Endoscopy;  Laterality: N/A;  . BIOPSY  06/10/2015   Procedure: BIOPSY;  Surgeon: Danie Binder, MD;  [facility];  Service: Endoscopy;;  gastric biopsy  . CARDIAC CATHETERIZATION  05-11-2003  dr Shelva Majestic ([facility])   Abnormal cardiolite/   Normal coronary arteries and normal LVF,  ef  63%  . CARDIOVASCULAR STRESS TEST  01-01-2014   normal lexiscan cardiolite/  no ischemia/ infarct/  normal LV function and wall motion ,  ef 81%  . COLONOSCOPY  last one 10/02/2011   [facility] [place] TICS, IH, NEXT TCS APR 2018  . COLONOSCOPY WITH PROPOFOL N/A 02/26/2017   Procedure: COLONOSCOPY WITH PROPOFOL;  Surgeon: Danie Binder, MD;  [facility];  Service: Endoscopy;  Laterality: N/A;  11:00am  . CYSTO/   RIGHT URETEROSOCOPY LASER LITHOTRIPSY STONE EXTRACTION  10-13-2004  . CYSTO/  RIGHT RETROGRADE PYELOGRAM/  PLACMENT RIGHT URETERAL STENT  01-10-2010  . CYSTOSCOPY W/ URETERAL STENT PLACEMENT Right 08/19/2015   Procedure: CYSTOSCOPY WITH RETROGRADE PYELOGRAM/URETERAL STENT PLACEMENT;  Surgeon: Franchot Gallo, MD;  Location: [facility];  Service: Urology;  Laterality: Right;  . CYSTOSCOPY WITH RETROGRADE PYELOGRAM, URETEROSCOPY AND STENT PLACEMENT Left 10/21/2014   Procedure: CYSTOSCOPY WITH RETROGRADE PYELOGRAM, URETEROSCOPY AND STENT PLACEMENT;   Surgeon: Franchot Gallo, MD;  Location: [facility];  Service: Urology;  Laterality: Left;  . CYSTOSCOPY WITH RETROGRADE PYELOGRAM, URETEROSCOPY AND STENT PLACEMENT Right 11/22/2015   Procedure: CYSTOSCOPY, RIGHT URETERAL STENT REMOVAL; RIGHT RETROGRADE PYELOGRAM, RIGHT URETEROSCOPY WITH BALLOON DILATION; RIGHT URETERAL STENT PLACEMENT;  Surgeon: Franchot Gallo, MD;  [facility];  Service: Urology;  Laterality: Right;  . CYSTOSCOPY/RETROGRADE/URETEROSCOPY/STONE EXTRACTION WITH BASKET Bilateral 08/02/2015   Procedure: CYSTOSCOPY; BILATERAL RETROGRADE PYELOGRAMS; BILATERAL URETEROSCOPY, STONE EXTRACTION WITH BASKET;  Surgeon: Franchot Gallo, MD;  [facility];  Service: Urology;  Laterality: Bilateral;  . CYSTOSCOPY/RETROGRADE/URETEROSCOPY/STONE EXTRACTION WITH BASKET Right 06/28/2015   Procedure: CYSTOSCOPY/RETROGRADE/URETEROSCOPY/STONE EXTRACTION WITH BASKET, RIGHT URETERAL DOUBLE J STENT PLACEMENT;  Surgeon: Franchot Gallo, MD;  [facility];  Service: Urology;  Laterality: Right;  . ESOPHAGOGASTRODUODENOSCOPY (EGD) WITH PROPOFOL N/A 03/17/2013   Procedure: ESOPHAGOGASTRODUODENOSCOPY (EGD) WITH PROPOFOL;  Surgeon: Danie Binder, MD;  [facility];  Service: Endoscopy;  Laterality: N/A;  . ESOPHAGOGASTRODUODENOSCOPY (EGD) WITH PROPOFOL N/A 06/10/2015   Distal gastritis, distal esophageal stricture s/p dilation  . ESOPHAGOGASTRODUODENOSCOPY (EGD) WITH PROPOFOL N/A 02/26/2017   Procedure: ESOPHAGOGASTRODUODENOSCOPY (EGD) WITH PROPOFOL;  Surgeon: Danie Binder, MD;  [facility];  Service: Endoscopy;  Laterality: N/A;  . FLEXIBLE SIGMOIDOSCOPY  09/11/2011   LOV:FIEPPIRJ Internal hemorrhoids  . HOLMIUM LASER APPLICATION Left  10/21/2014   Procedure: HOLMIUM LASER APPLICATION;  Surgeon: Franchot Gallo, MD;  Location: [facility];  Service: Urology;  Laterality: Left;  . HOLMIUM LASER APPLICATION Right 0/08/7046   Procedure: HOLMIUM  LASER APPLICATION;  Surgeon: Franchot Gallo, MD;  [facility];  Service: Urology;  Laterality: Right;  . MASS EXCISION Left 03/04/2015   Procedure: EXCISION OF SOFT TISSUE NEOPLASM LEFT ARM;  Surgeon: Aviva Signs, MD;  Location: [facility];  Service: General;  Laterality: Left;  . PERCUTANEOUS NEPHROSTOLITHOTOMY Bilateral 12/  2011     [facility]  . POLYPECTOMY N/A 03/17/2013   Procedure: GASTRIC POLYPECTOMY;  Surgeon: Danie Binder, MD;  [facility];  Service: Endoscopy;  Laterality: N/A;  . POLYPECTOMY  02/26/2017   Procedure: POLYPECTOMY;  Surgeon: Danie Binder, MD;  [facility];  Service: Endoscopy;;  polyp at cecum, ascending colon polyps x3, hepatic flexure polyps x8, transverse colon polyps x8   . REMOVAL RIGHT THIGH CYST  2006  . SAVORY DILATION N/A 03/17/2013   Procedure: SAVORY DILATION;  Surgeon: Danie Binder, MD;  [facility];  Service: Endoscopy;  Laterality: N/A;  #12.8, 14, 15, 16 dilators used  . SAVORY DILATION N/A 06/10/2015   Procedure: SAVORY DILATION;  Surgeon: Danie Binder, MD;  [facility];  Service: Endoscopy;  Laterality: N/A;  . TRANSTHORACIC ECHOCARDIOGRAM  01-01-2014   mild LVH/  ef 88-91%/  grade I diastolic dysfunction/  trivial MR, TR, and PR  . VAGINAL HYSTERECTOMY  1970's    SOCIAL HISTORY: Social History   Socioeconomic History  . Marital status: Divorced    Spouse name: Not on file  . Number of children: 2  . Years of education: Not on file  . Highest education level: Not on file  Social Needs  . Financial resource strain: Not on file  . Food insecurity - worry: Not on file  . Food insecurity - inability: Not on file  . Transportation needs - medical: Not on file  . Transportation needs - non-medical: Not on file  Occupational History  . Occupation: retired   Tobacco Use  . Smoking status: Former Smoker    Packs/day: 1.00    Years: 20.00    Pack years: 20.00    Types: Cigarettes    Start date:  09/21/1974    Last attempt to quit: 09/20/1988    Years since quitting: 28.8  . Smokeless tobacco: Never Used  . Tobacco comment: Quit x 20 years  Substance and Sexual Activity  . Alcohol use: No    Alcohol/week: 0.0 oz  . Drug use: No  . Sexual activity: No    Birth control/protection: Surgical  Other Topics Concern  . Not on file  Social History Narrative   Patient is right handed   Patient drinks some caffeine daily.    FAMILY HISTORY Family History  Problem Relation Age of Onset  . Hypertension Mother   . Diabetes Mother   . Heart failure Mother   . Dementia Mother   . Emphysema Father   . Hypertension Father   . Diabetes Brother   . GER disease Brother   . Hypertension Sister   . Hypertension Sister   . Cancer Unknown        family history   . Diabetes Unknown        family history   . Heart defect Unknown        famiily history   . Arthritis Unknown  family history   . Anesthesia problems Neg Hx   . Hypotension Neg Hx   . Malignant hyperthermia Neg Hx   . Pseudochol deficiency Neg Hx   . Colon cancer Neg Hx     ALLERGIES:  is allergic to ace inhibitors; keflex [cephalexin]; nitrofurantoin; and penicillins.  MEDICATIONS:  Current Outpatient Medications  Medication Sig Dispense Refill  . ALPRAZolam (XANAX) 0.5 MG tablet Take 1 tablet (0.5 mg total) by mouth 2 (two) times daily. 60 tablet 3  . amLODipine (NORVASC) 10 MG tablet Take 1 tablet (10 mg total) by mouth daily. 90 tablet 3  . aspirin EC 325 MG tablet Take 325 mg by mouth daily.    . Cholecalciferol (VITAMIN D3) 5000 units CAPS Take 1 capsule by mouth 2 (two) times daily.    . clopidogrel (PLAVIX) 75 MG tablet Take 75 mg by mouth daily.    . Cyanocobalamin (B-12 PO) Take 1 tablet by mouth daily.    . diphenhydramine-acetaminophen (TYLENOL PM) 25-500 MG TABS tablet Take 2 tablets by mouth at bedtime.    . docusate sodium (COLACE) 100 MG capsule Take 200 mg by mouth at bedtime.     . iron  polysaccharides (NIFEREX) 150 MG capsule Take 150 mg by mouth daily.    Marland Kitchen lovastatin (MEVACOR) 40 MG tablet TAKE 2 TABLETS (80 MG TOTAL) BY MOUTH AT BEDTIME. 180 tablet 1  . OXYGEN Inhale into the lungs daily. Bedtime    . pantoprazole (PROTONIX) 40 MG tablet TAKE 1 TABLET (40 MG TOTAL) BY MOUTH 2 (TWO) TIMES DAILY. 180 tablet 1  . PARoxetine (PAXIL) 40 MG tablet Take 1 tablet (40 mg total) by mouth every morning. 90 tablet 1  . TRADJENTA 5 MG TABS tablet TAKE 1 TABLET BY MOUTH EVERY DAY 90 tablet 1  . triamcinolone cream (KENALOG) 0.1 % Apply 1 application topically 3 (three) times daily. (Patient taking differently: Apply 1 application topically 3 (three) times daily as needed. ) 30 g 0   No current facility-administered medications for this visit.     PHYSICAL EXAMINATION:  ECOG PERFORMANCE STATUS: 1 - Symptomatic but completely ambulatory   Vitals:   08/02/17 1428  BP: 103/67  Pulse: 89  Resp: 16  Temp: 98.3 F (36.8 C)  SpO2: 96%    Filed Weights   08/02/17 1428  Weight: 208 lb (94.3 kg)     Physical Exam  Constitutional: She is oriented to person, place, and time and well-developed, well-nourished, and in no distress. Vital signs are normal.  HENT:  Head: Normocephalic and atraumatic.  Eyes: Pupils are equal, round, and reactive to light.  Neck: Normal range of motion.  Cardiovascular: Normal rate, regular rhythm and normal heart sounds.  No murmur heard. Pulmonary/Chest: Effort normal and breath sounds normal. She has no wheezes.  Abdominal: Soft. Normal appearance and bowel sounds are normal. She exhibits no distension. There is no tenderness.  Musculoskeletal: Normal range of motion. She exhibits no edema.  Neurological: She is alert and oriented to person, place, and time. Gait normal.  Skin: Skin is warm and dry. No rash noted.  Psychiatric: Mood, memory, affect and judgment normal.   LABORATORY DATA: I have personally reviewed the data as  listed:  Appointment on 08/02/2017  Component Date Value Ref Range Status  . WBC 08/02/2017 8.0  4.0 - 10.5 K/uL Final  . RBC 08/02/2017 4.41  3.87 - 5.11 MIL/uL Final  . Hemoglobin 08/02/2017 13.8  12.0 - 15.0 g/dL Final  .  HCT 08/02/2017 42.3  36.0 - 46.0 % Final  . MCV 08/02/2017 95.9  78.0 - 100.0 fL Final  . MCH 08/02/2017 31.3  26.0 - 34.0 pg Final  . MCHC 08/02/2017 32.6  30.0 - 36.0 g/dL Final  . RDW 08/02/2017 17.0* 11.5 - 15.5 % Final  . Platelets 08/02/2017 265  150 - 400 K/uL Final  . Neutrophils Relative % 08/02/2017 71  % Final  . Neutro Abs 08/02/2017 5.7  1.7 - 7.7 K/uL Final  . Lymphocytes Relative 08/02/2017 21  % Final  . Lymphs Abs 08/02/2017 1.6  0.7 - 4.0 K/uL Final  . Monocytes Relative 08/02/2017 6  % Final  . Monocytes Absolute 08/02/2017 0.5  0.1 - 1.0 K/uL Final  . Eosinophils Relative 08/02/2017 2  % Final  . Eosinophils Absolute 08/02/2017 0.2  0.0 - 0.7 K/uL Final  . Basophils Relative 08/02/2017 0  % Final  . Basophils Absolute 08/02/2017 0.0  0.0 - 0.1 K/uL Final   Performed at [facility], [address] [place], [place] [address]  . Sodium 08/02/2017 135  135 - 145 mmol/L Final  . Potassium 08/02/2017 3.4* 3.5 - 5.1 mmol/L Final  . Chloride 08/02/2017 100* 101 - 111 mmol/L Final  . CO2 08/02/2017 23  22 - 32 mmol/L Final  . Glucose, Bld 08/02/2017 210* 65 - 99 mg/dL Final  . BUN 08/02/2017 15  6 - 20 mg/dL Final  . Creatinine, Ser 08/02/2017 1.75* 0.44 - 1.00 mg/dL Final  . Calcium 08/02/2017 9.9  8.9 - 10.3 mg/dL Final  . Total Protein 08/02/2017 8.8* 6.5 - 8.1 g/dL Final  . Albumin 08/02/2017 3.9  3.5 - 5.0 g/dL Final  . AST 08/02/2017 26  15 - 41 U/L Final  . ALT 08/02/2017 23  14 - 54 U/L Final  . Alkaline Phosphatase 08/02/2017 96  38 - 126 U/L Final  . Total Bilirubin 08/02/2017 0.3  0.3 - 1.2 mg/dL Final  . GFR calc non Af Amer 08/02/2017 28* >60 mL/min Final  . GFR calc Af Amer 08/02/2017 32* >60 mL/min Final   Comment: (NOTE) The  eGFR has been calculated using the CKD EPI equation. This calculation has not been validated in all clinical situations. eGFR's persistently <60 mL/min signify possible Chronic Kidney Disease.   Georgiann Hahn gap 08/02/2017 12  5 - 15 Final   Performed at [facility], [address] [place], [place] [address]  . Iron 08/02/2017 70  28 - 170 ug/dL Final  . TIBC 08/02/2017 360  250 - 450 ug/dL Final  . Saturation Ratios 08/02/2017 19  10.4 - 31.8 % Final  . UIBC 08/02/2017 290  ug/dL Final   Performed at [facility], [place] [address] [place], [place] [address]  . Ferritin 08/02/2017 43  11 - 307 ng/mL Final   Performed at [facility] [address] [place], [place] [address]   PATHOLOGY: Diagnosis Bone Marrow, Aspirate,Biopsy, and Clot, left iliac crest - BONE MARROW WITH TRILINEAGE HEMATOPOIESIS AND PLASMACYTOSIS. - SEE COMMENT. PERIPHERAL BLOOD: - MILD NORMOCYTIC-NORMOCHROMIC ANEMIA. Diagnosis Note The clot and biopsy sections are suboptimal for evaluation. The aspirate shows trilineage hematopoiesis with non-specific changes, but there is increased number of plasma cells representing 14% of all cells with occasional atypical forms. Immunohistochemical stains were performed and highlight the plasma cell component in relatively limited bone marrow areas which appear to show kappa light chain excess. In the presence of a monoclonal protein with kappa light  specificity, the limited findings favor plasma cell neoplasm. Correlation with cytogenetic and FISH studies are recommended. (BNS:ah/gt 02/20/17) Susanne Greenhouse MD Pathologist, Electronic RADIOGRAPHIC STUDIES: I have personally reviewed the radiological images as listed and agree with the findings in the report  No results found.  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  -Bone marrow biopsy demonstrated 14% plasma cells with normal cytogenetics. - Bone  survey negative for lytic lesions. - I reviewed her labs with her and her daughter today. Her hemoglobin continues to improve. She still does not meet CRAB (hypercalcemia, renal failure with creatinine > 2, anemia < 10 g/dl, bone lytic leisons) criteria for symptomatic treatment requiring beginning treatment. Will route my note to Dr.Befekadu her nephrologist.  - Myeloma labs pending during dictation. Creatinine appears to be climbing. Today 1.75. BUNs normal. GFR 32. Creatinine clearance of 33.1.  -As discussed previously with Dr. Talbert Cage induction treatment should she need it beginning with Revlimid, Velcade and Decadron.    Iron deficiency anemia -She has received 2 doses of IV Feraheme last being on 04/15/2017 and 04/22/2017. Current hemoglobin is 13.8 which is up from 11.6 g/dL. She continues to take 1 iron tablet daily. - Iron and anemia panel revealed normal iron stores with a ferritin of 43 and iron saturation of 19%.    Weakness/fatigue - Recommended patient become more active. Given the patient has recently had a fall, she continues to feel off balance when walking. Patient also has a history of a stroke. She lives alone. - If possible would like to arrange for her to have physical therapy and occupational therapy come to her home to help with strengthening and conditioning and appropriate techniques to safely perform her ADLs. We also spoke in detail regarding her nutrition status and that some of her weakness and fatigue could be coming from the fact that she is not eating a healthy diet. Patient is gained 4 pounds since last visit in November. Would recommend a nutritional/dietary consult at next visit if the weakness/fatigue continues. We also spoke about possibly getting her involved in a Silver sneakers program. He states her insurance company will pay for this.  Depression - Patient states she often feels depressed because of her lack of mobility and strength. Spoke about antidepressants  and how they can take 4-6 weeks to begin working. We spoke at length about different side effects. Patient tells me she would not like to be dependent on a medication she is required to take daily. I understand her feelings on beginning a new medication, but encouraged her to let me know if she changes her mind.  -RTC in 3 months for follow up with labs below.  Orders Placed This Encounter  Procedures  . Ambulatory referral to Occupational Therapy    Referral Priority:   Routine    Referral Type:   Occupational Therapy    Referral Reason:   Patient Preference    Requested Specialty:   Occupational Therapy    Number of Visits Requested:   1  . Ambulatory referral to Physical Therapy    Referral Priority:   Routine    Referral Type:   Physical Medicine    Referral Reason:   Specialty Services Required    Requested Specialty:   Physical Therapy    Number of Visits Requested:   1   Greater than 50% was spent in counseling and coordination of care with this patient including but not limited to discussion of  the relevant topics above (See A&P) including, but not limited to diagnosis and management of acute and chronic medical conditions.   All questions were answered. The patient knows to call the clinic with any problems, questions or concerns.  This note was electronically signed.    Jacquelin Hawking, NP  08/05/2017 11:45 AM

## 2017-08-05 LAB — KAPPA/LAMBDA LIGHT CHAINS
Kappa free light chain: 48.5 mg/L — ABNORMAL HIGH (ref 3.3–19.4)
Kappa, lambda light chain ratio: 2.66 — ABNORMAL HIGH (ref 0.26–1.65)
Lambda free light chains: 18.2 mg/L (ref 5.7–26.3)

## 2017-08-06 ENCOUNTER — Other Ambulatory Visit: Payer: Self-pay | Admitting: Oncology

## 2017-08-06 ENCOUNTER — Telehealth (HOSPITAL_COMMUNITY): Payer: Self-pay

## 2017-08-06 DIAGNOSIS — N183 Chronic kidney disease, stage 3 unspecified: Secondary | ICD-10-CM

## 2017-08-06 LAB — MULTIPLE MYELOMA PANEL, SERUM
Albumin SerPl Elph-Mcnc: 3.9 g/dL (ref 2.9–4.4)
Albumin/Glob SerPl: 0.9 (ref 0.7–1.7)
Alpha 1: 0.2 g/dL (ref 0.0–0.4)
Alpha2 Glob SerPl Elph-Mcnc: 1 g/dL (ref 0.4–1.0)
B-Globulin SerPl Elph-Mcnc: 1.4 g/dL — ABNORMAL HIGH (ref 0.7–1.3)
Gamma Glob SerPl Elph-Mcnc: 2 g/dL — ABNORMAL HIGH (ref 0.4–1.8)
Globulin, Total: 4.7 g/dL — ABNORMAL HIGH (ref 2.2–3.9)
IgA: 259 mg/dL (ref 64–422)
IgG (Immunoglobin G), Serum: 2049 mg/dL — ABNORMAL HIGH (ref 700–1600)
IgM (Immunoglobulin M), Srm: 139 mg/dL (ref 26–217)
M Protein SerPl Elph-Mcnc: 1.4 g/dL — ABNORMAL HIGH
Total Protein ELP: 8.6 g/dL — ABNORMAL HIGH (ref 6.0–8.5)

## 2017-08-06 NOTE — Telephone Encounter (Signed)
Patient states she has seen Dr. Theador Hawthorne in the past but does not have a follow up with him. Sonia Baller, I guess you need to  put in a referral for her to go back to see him.

## 2017-08-06 NOTE — Telephone Encounter (Signed)
Okay I can do that. I will do it now.

## 2017-08-06 NOTE — Telephone Encounter (Signed)
Thanks

## 2017-08-06 NOTE — Telephone Encounter (Signed)
-----   Message from Louis Meckel sent at 08/05/2017 12:09 PM EST ----- Demetrios Loll sent me this message can you call patient about her labs and ask about the nephrologist.  Please let me know if I need to do something.  Thanks Amy  ----- Message ----- From: Jacquelin Hawking, NP Sent: 08/05/2017  11:51 AM To: Louis Meckel  Can we find out if this patient has a nephrologist?. I could not see a note from anyone. Her kidney numbers are steadily declining. Would like for her to be evaluated or reevaluated by a nephrologist if possible. Let me know if you need me to place a referral.  Sonia Baller

## 2017-08-07 ENCOUNTER — Encounter (HOSPITAL_COMMUNITY): Payer: Self-pay | Admitting: Oncology

## 2017-08-07 NOTE — Progress Notes (Signed)
Patient referred back to [facility] for follow-up (Dr. Hedy Camara and Dr. Theador Hawthorne).  Records faxed to [phone].

## 2017-08-17 DIAGNOSIS — R0902 Hypoxemia: Secondary | ICD-10-CM | POA: Diagnosis not present

## 2017-08-27 ENCOUNTER — Other Ambulatory Visit: Payer: Self-pay | Admitting: Family Medicine

## 2017-08-28 ENCOUNTER — Other Ambulatory Visit: Payer: Self-pay

## 2017-08-28 ENCOUNTER — Ambulatory Visit (HOSPITAL_COMMUNITY): Payer: Medicare Other | Attending: Oncology | Admitting: Physical Therapy

## 2017-08-28 ENCOUNTER — Encounter (HOSPITAL_COMMUNITY): Payer: Self-pay | Admitting: Physical Therapy

## 2017-08-28 DIAGNOSIS — M6281 Muscle weakness (generalized): Secondary | ICD-10-CM | POA: Diagnosis not present

## 2017-08-28 DIAGNOSIS — R29898 Other symptoms and signs involving the musculoskeletal system: Secondary | ICD-10-CM | POA: Insufficient documentation

## 2017-08-28 DIAGNOSIS — R296 Repeated falls: Secondary | ICD-10-CM | POA: Diagnosis not present

## 2017-08-28 DIAGNOSIS — R262 Difficulty in walking, not elsewhere classified: Secondary | ICD-10-CM | POA: Diagnosis not present

## 2017-08-28 NOTE — Therapy (Signed)
Cheryl Abbott, [place], [address] Phone: [phone]   Fax:  [phone]  Physical Therapy Evaluation  Cheryl Abbott Details  Name: Cheryl Abbott MRN: [id number] Date of Birth: 1946/05/06 Referring Provider: Faythe Casa    Encounter Date: 08/28/2017  Cheryl Abbott - 08/28/17 1334    Visit Number  1    Number of Visits  12    Date for Cheryl Abbott  10/09/17    Authorization Type  medicare    Authorization - Visit Number  1    Authorization - Number of Visits  12    Cheryl Start Time  1300    Cheryl Stop Time  1340    Cheryl Time Calculation (min)  40 min    Activity Tolerance  Cheryl Abbott tolerated treatment well       Past Medical History:  Diagnosis Date  . Anemia   . Anxiety   . Anxiety disorder   . Arthritis   . Cancer ([place])   . 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 Chronic back pain   . Depression   . Fatty liver   . GERD (gastroesophageal reflux disease)   . Headache   . History of adenomatous polyp of colon    2008  . History of kidney stones   . Hypercalcemia   . Hyperlipidemia   . Hypertension   . Left ureteral stone   . Lipoma of arm 01/19/2015  . Lumbar disc disease with radiculopathy   . Nephrolithiasis   . Neuropathy, peripheral   . OSA treated with BiPAP    per study 2007  . Sciatica   . Sigmoid diverticulosis   . Type 2 diabetes mellitus ([place])     Past Surgical History:  Procedure Laterality Date  . BIOPSY N/A 03/17/2013   Procedure: GASTRIC BIOPSIES;  Surgeon: Danie Binder, MD;  Location: [facility];  Service: Endoscopy;  Laterality: N/A;  . BIOPSY  06/10/2015   Procedure: BIOPSY;  Surgeon: Danie Binder, MD;  [facility];  Service: Endoscopy;;  gastric biopsy  . CARDIAC CATHETERIZATION  05-11-2003  dr Shelva Majestic ([facility])   Abnormal cardiolite/   Normal coronary arteries and normal LVF,  ef  63%  . CARDIOVASCULAR STRESS TEST  01-01-2014   normal  lexiscan cardiolite/  no ischemia/ infarct/  normal LV function and wall motion ,  ef 81%  . COLONOSCOPY  last one 10/02/2011   MOD [place] TICS, IH, NEXT TCS APR 2018  . COLONOSCOPY WITH PROPOFOL N/A 02/26/2017   Procedure: COLONOSCOPY WITH PROPOFOL;  Surgeon: Danie Binder, MD;  [facility];  Service: Endoscopy;  Laterality: N/A;  11:00am  . CYSTO/   RIGHT URETEROSOCOPY LASER LITHOTRIPSY STONE EXTRACTION  10-13-2004  . CYSTO/  RIGHT RETROGRADE PYELOGRAM/  PLACMENT RIGHT URETERAL STENT  01-10-2010  . CYSTOSCOPY W/ URETERAL STENT PLACEMENT Right 08/19/2015   Procedure: CYSTOSCOPY WITH RETROGRADE PYELOGRAM/URETERAL STENT PLACEMENT;  Surgeon: Franchot Gallo, MD;  Location: [facility];  Service: Urology;  Laterality: Right;  . CYSTOSCOPY WITH RETROGRADE PYELOGRAM, URETEROSCOPY AND STENT PLACEMENT Left 10/21/2014   Procedure: CYSTOSCOPY WITH RETROGRADE PYELOGRAM, URETEROSCOPY AND STENT PLACEMENT;  Surgeon: Franchot Gallo, MD;  Location[facility];  Service: Urology;  Laterality: Left;  . CYSTOSCOPY WITH RETROGRADE PYELOGRAM, URETEROSCOPY AND STENT PLACEMENT Right 11/22/2015   Procedure: CYSTOSCOPY, RIGHT URETERAL STENT REMOVAL; RIGHT RETROGRADE PYELOGRAM, RIGHT URETEROSCOPY WITH BALLOON DILATION; RIGHT URETERAL STENT PLACEMENT;  Surgeon:  Franchot Gallo, MD;  Location: [facility];  Service: Urology;  Laterality: Right;  . CYSTOSCOPY/RETROGRADE/URETEROSCOPY/STONE EXTRACTION WITH BASKET Bilateral 08/02/2015   Procedure: CYSTOSCOPY; BILATERAL RETROGRADE PYELOGRAMS; BILATERAL URETEROSCOPY, STONE EXTRACTION WITH BASKET;  Surgeon: Franchot Gallo, MD;  Location: [facility];  Service: Urology;  Laterality: Bilateral;  . CYSTOSCOPY/RETROGRADE/URETEROSCOPY/STONE EXTRACTION WITH BASKET Right 06/28/2015   Procedure: CYSTOSCOPY/RETROGRADE/URETEROSCOPY/STONE EXTRACTION WITH BASKET, RIGHT URETERAL DOUBLE J STENT PLACEMENT;  Surgeon: Franchot Gallo, MD;  Location: [facility];  Service: Urology;   Laterality: Right;  . ESOPHAGOGASTRODUODENOSCOPY (EGD) WITH PROPOFOL N/A 03/17/2013   Procedure: ESOPHAGOGASTRODUODENOSCOPY (EGD) WITH PROPOFOL;  Surgeon: Danie Binder, MD;  [facility]: [facility];  Service: Endoscopy;  Laterality: N/A;  . ESOPHAGOGASTRODUODENOSCOPY (EGD) WITH PROPOFOL N/A 06/10/2015   Distal gastritis, distal esophageal stricture s/p dilation  . ESOPHAGOGASTRODUODENOSCOPY (EGD) WITH PROPOFOL N/A 02/26/2017   Procedure: ESOPHAGOGASTRODUODENOSCOPY (EGD) WITH PROPOFOL;  Surgeon: Danie Binder, MD;  [facility];  Service: Endoscopy;  Laterality: N/A;  . FLEXIBLE SIGMOIDOSCOPY  09/11/2011   TAV:[id number] Internal hemorrhoids  . HOLMIUM LASER APPLICATION Left 6/55/3748   Procedure: HOLMIUM LASER APPLICATION;  Surgeon: Franchot Gallo, MD;  Location: [facility];  Service: Urology;  Laterality: Left;  . HOLMIUM LASER APPLICATION Right 2/70/7867   Procedure: HOLMIUM LASER APPLICATION;  Surgeon: Franchot Gallo, MD;  Location: [facility];  Service: Urology;  Laterality: Right;  . MASS EXCISION Left 03/04/2015   Procedure: EXCISION OF SOFT TISSUE NEOPLASM LEFT ARM;  Surgeon: Aviva Signs, MD;  Location: [facility];  Service: General;  Laterality: Left;  . PERCUTANEOUS NEPHROSTOLITHOTOMY Bilateral 12/  2011     [facility]  . POLYPECTOMY N/A 03/17/2013   Procedure: GASTRIC POLYPECTOMY;  Surgeon: Danie Binder, MD;  Location: [facility];  Service: Endoscopy;  Laterality: N/A;  . POLYPECTOMY  02/26/2017   Procedure: POLYPECTOMY;  Surgeon: Danie Binder, MD;  [facility];  Service: Endoscopy;;  polyp at cecum, ascending colon polyps x3, hepatic flexure polyps x8, transverse colon polyps x8   . REMOVAL RIGHT THIGH CYST  2006  . SAVORY DILATION N/A 03/17/2013   Procedure: SAVORY DILATION;  Surgeon: Danie Binder, MD;  Location: [facility];  Service: Endoscopy;  Laterality: N/A;  #12.8, 14, 15, 16 dilators used  . SAVORY DILATION N/A 06/10/2015   Procedure: SAVORY DILATION;   Surgeon: Danie Binder, MD;  [facility];  Service: Endoscopy;  Laterality: N/A;  . TRANSTHORACIC ECHOCARDIOGRAM  01-01-2014   mild LVH/  ef 54-49%/  grade I diastolic dysfunction/  trivial MR, TR, and PR  . VAGINAL HYSTERECTOMY  1970's    There were no vitals filed for this visit.   Subjective Assessment - 08/28/17 1307    Subjective  Cheryl Abbott states that she has no energy and is unsteady on her feet.     Pertinent History  TIA 2017, DM, anemia, chronic kidney Dz, fatigut , depression, cancer     Limitations  Standing;Walking;House hold activities    How long can you sit comfortably?  no problem    How long can you stand comfortably?  2:30  tested     How long can you walk comfortably?  less than 3 minutes     Currently in Pain?  No/denies         [facility] Cheryl Assessment - 08/28/17 0001      Assessment   Medical Diagnosis  difficulty in walking     Referring Provider  Faythe Casa  Onset Date/Surgical Date  07/12/17    Next MD Visit  3.26.2019    Prior Therapy  last year       Precautions   Precautions  Fall      Restrictions   Weight Bearing Restrictions  No      Balance Screen   Has the Cheryl Abbott fallen in the past 6 months  Yes    How many times?  1    Has the Cheryl Abbott had a decrease in activity level because of a fear of falling?   Yes    Is the Cheryl Abbott reluctant to leave their home because of a fear of falling?   No      Home Film/video editor residence      Prior Function   Level of Independence  Independent with community mobility with device      Cognition   Overall Cognitive Status  Within Functional Limits for tasks assessed      Functional Tests   Functional tests  Single leg stance;Sit to Stand      Single Leg Stance   Comments  Rt: 8 seconds; Lt  7      Sit to Stand   Comments  5 x 27.75 hands on knees       ROM / Strength   AROM / PROM / Strength  Strength      Strength   Strength Assessment Site   Hip;Knee;Ankle    Right/Left Hip  Right;Left    Right Hip Flexion  2/5    Right Hip Extension  2/5    Right Hip ABduction  3/5    Left Hip Flexion  3+/5    Left Hip Extension  3/5    Left Hip ABduction  3/5    Right/Left Knee  Right;Left    Right Knee Flexion  4-/5    Right Knee Extension  3+/5    Left Knee Flexion  3+/5    Left Knee Extension  4/5    Right/Left Ankle  Right;Left    Right Ankle Dorsiflexion  3/5 cogwheel    Left Ankle Dorsiflexion  3/5 cogwheel       Ambulation/Gait   Ambulation Distance (Feet)  244 Feet    Assistive device  None    Gait Comments  3'             Objective measurements completed on examination: See above findings.      [facility] Adult Cheryl Treatment/Exercise - 08/28/17 0001      Exercises   Exercises  Knee/Hip      Knee/Hip Exercises: Standing   Heel Raises  Both;10 reps    Functional Squat  5 reps      Knee/Hip Exercises: Seated   Long Arc Quad  Strengthening;Both;10 reps    Long Arc Quad Weight  3 lbs.    Sit to [employer]  5 reps             Cheryl Education - 08/28/17 1420    Education provided  Yes    Education Details  HEP    Person(s) Educated  Cheryl Abbott    Methods  Explanation;Demonstration;Handout    Comprehension  Verbalized understanding;Returned demonstration       Cheryl Short Term Goals - 08/28/17 1426      Cheryl SHORT TERM GOAL #1   Title  Cheryl to be able to single leg stance with both Le for 15 seconds to reduce the risk of falling.  Time  3    Period  Weeks    Status  New    Target Date  09/18/17      Cheryl SHORT TERM GOAL #2   Title  Cheryl to be able to ambulate for six minute without resting to show progression in community ambulation.     Time  3    Period  Weeks      Cheryl SHORT TERM GOAL #3   Title  Cheryl to be able to complet five sit to stand in 20 seconds or less to demonstrate increased power of LE for funtional activity such as sit to stand, arising from a squatted position.     Time  3    Period  Weeks         Cheryl Long Term Goals - 08/28/17 1429      Cheryl LONG TERM GOAL #1   Title  Cheryl to be able ot single leg stance on both LE for 25 seconds to reduce risk of falling     Time  6    Period  Weeks    Status  New    Target Date  10/09/17      Cheryl LONG TERM GOAL #2   Title  Cheryl to be abl eot ambulate for 15 minutes without resting to become a Hydrographic surveyor.     Time  6    Period  Weeks    Status  New      Cheryl LONG TERM GOAL #3   Title  Cheryl to be able to complet 5 sit to stand in 15 seconds to demonstrate improve power of B LE to arise from lower level objects such as a car.    Time  6    Period  Weeks    Status  New      Cheryl LONG TERM GOAL #4   Title  Cheryl B LE mm grade to increase by one level to allow Cheryl to step  up onto a curb with confidence.     Time  6    Period  Weeks    Status  New             Plan - 08/28/17 1420    Clinical Impression Statement  Cheryl Abbott is a 72 yo female who has been referred to skilled physical therapy for instability while walking as well as decreased activity tolerance.  Evaluation demonstrates decreased core and B LE power and strength, decreased balance, decreased activity tolerance as well as difficulty walking.  Cheryl Abbott will benefit from skilled physical therapy to address these issues and provide her with education for a HEP to improve her balance, strength and activity tolerance.     Clinical Presentation  Evolving    Clinical Decision Making  Moderate    Rehab Potential  Fair    Cheryl Frequency  2x / week    Cheryl Duration  6 weeks    Cheryl Treatment/Interventions  Gait training;Functional mobility training;Therapeutic activities;Therapeutic exercise;Balance training;Neuromuscular re-education;Cheryl Abbott/family education    Cheryl Next Visit Plan  Ambulate every Abbott with goal of increasing ambulation distance by 50 ft.  Cheryl to begin lunging and step up activities with B LE >     Cheryl Home Exercise Plan  eval:   heel raises, squats, sit to stand,  SLR, bridge and  sitting ankle Dorsi/plantar flexion .      Consulted and Agree with Plan of Care  Cheryl Abbott  Cheryl Abbott will benefit from skilled therapeutic intervention in order to improve the following deficits and impairments:  Abnormal gait, Decreased activity tolerance, Decreased balance, Decreased mobility, Decreased strength, Difficulty walking, Impaired perceived functional ability, Obesity, Pain  Visit Diagnosis: Muscle weakness (generalized) - Plan: Cheryl plan of care cert/re-cert  Difficulty in walking, not elsewhere classified - Plan: Cheryl plan of care cert/re-cert  Repeated falls - Plan: Cheryl plan of care cert/re-cert     Problem List Cheryl Abbott Active Problem List   Diagnosis Date Noted  . Multiple myeloma ([place]) 03/15/2017  . Urinary frequency 02/28/2017  . SIRS (systemic inflammatory response syndrome) ([place]) 02/26/2017  . 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 Morbid obesity ([place]) 10/14/2016  . Vaginal dryness, menopausal 10/14/2016  . Neuropathic pain of both feet 04/11/2016  . TIA (transient ischemic attack) 02/26/2016  . Lactose intolerance in adult 02/01/2016  . Brain TIA 08/19/2015  . CKD (chronic kidney disease) stage 3, GFR 30-59 ml/min ([facility]) 07/12/2015  . Lipoma of arm 01/19/2015  . Anemia 10/14/2014  . Lumbar back pain with radiculopathy affecting left lower extremity 05/11/2014  . Nocturnal hypoxia 02/04/2014  . Generalized anxiety disorder 05/20/2013  . Anxiety and depression 08/13/2012  . Abnormal brain scan 02/07/2012  . Thoracic or lumbosacral neuritis or radiculitis, unspecified 12/22/2010  . Hypercalcemia 11/30/2010  . Colon adenomas 05/11/2009  . HEMORRHOIDS 03/15/2009  . GERD 03/15/2009  . FATTY LIVER DISEASE 03/15/2009  . RENAL CALCULUS 03/15/2009  . Sleep apnea 09/15/2008  . 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 Hyperlipidemia LDL goal <100 09/17/2007  . Hypertension goal BP (blood pressure) < 130/80 09/17/2007    Cheryl Abbott, Cheryl Abbott 08/28/2017, 2:34 PM  [place], [place], [address] Phone: [phone]   Fax:  [phone]  Name: Cheryl Abbott MRN: [id number] Date of Birth: 03-24-1946

## 2017-08-28 NOTE — Patient Instructions (Addendum)
Functional Quadriceps: Chair Squat    Keeping feet flat on floor, shoulder width apart, squat as low as is comfortable. Use support as necessary. Repeat _10___ times per set. Do 1____ sets per session. Do __2__ sessions per day.  http://orth.exer.us/736   Copyright  VHI. All rights reserved.  Heel Raise: Bilateral (Standing)   At kitchen counter  Rise on balls of feet. Repeat __10__ times per set. Do _1___ sets per session. Do _2___ sessions per day.  http://orth.exer.us/38   Copyright  VHI. All rights reserved.  Functional Quadriceps: Sit to Stand    Sit on edge of chair, feet flat on floor. Stand upright, extending knees fully. Repeat _5___ times per set. Do ___1_ sets per session. Do _2___ sessions per day.  http://orth.exer.us/734   Copyright  VHI. All rights reserved.  Toe Raise (Sitting)    Raise toes, keeping heels on floor. Now raise your heels up  Repeat _10___ times per set. Do _1___ sets per session. Do _2___ sessions per day.  http://orth.exer.us/46   Copyright  VHI. All rights reserved.  Knee Extension (Sitting)    Place __2__ pound weight on left ankle and straighten knee fully, lower slowly. Repeat __10__ times per set. Do ___2_ sets per session. Do __2__ sessions per day. 1 http://orth.exer.us/732   Copyright  VHI. All rights reserved.

## 2017-08-29 ENCOUNTER — Ambulatory Visit (HOSPITAL_COMMUNITY): Payer: Medicare Other | Admitting: Physical Therapy

## 2017-08-29 ENCOUNTER — Encounter (HOSPITAL_COMMUNITY): Payer: Self-pay | Admitting: Physical Therapy

## 2017-08-29 DIAGNOSIS — R296 Repeated falls: Secondary | ICD-10-CM

## 2017-08-29 DIAGNOSIS — R29898 Other symptoms and signs involving the musculoskeletal system: Secondary | ICD-10-CM | POA: Diagnosis not present

## 2017-08-29 DIAGNOSIS — R262 Difficulty in walking, not elsewhere classified: Secondary | ICD-10-CM | POA: Diagnosis not present

## 2017-08-29 DIAGNOSIS — M6281 Muscle weakness (generalized): Secondary | ICD-10-CM

## 2017-08-29 NOTE — Therapy (Signed)
[place], [place], [address] Phone: [phone]   Fax:  [phone]  Physical Therapy Treatment  Patient Details  Name: Cheryl Abbott MRN: [id number] Date of Birth: 17-Mar-1946 Referring Provider: Faythe Casa    Encounter Date: 08/29/2017  PT End of Session - 08/29/17 0832    Visit Number  2    Number of Visits  12    Date for PT Re-Evaluation  10/09/17    Authorization Type  medicare    Authorization - Visit Number  2    Authorization - Number of Visits  12    PT Start Time  0815    PT Stop Time  0900    PT Time Calculation (min)  45 min    Activity Tolerance  Patient tolerated treatment well       Past Medical History:  Diagnosis Date  . Anemia   . Anxiety   . Anxiety disorder   . Arthritis   . Cancer ([place])   . 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 Chronic back pain   . Depression   . Fatty liver   . GERD (gastroesophageal reflux disease)   . Headache   . History of adenomatous polyp of colon    2008  . History of kidney stones   . Hypercalcemia   . Hyperlipidemia   . Hypertension   . Left ureteral stone   . Lipoma of arm 01/19/2015  . Lumbar disc disease with radiculopathy   . Nephrolithiasis   . Neuropathy, peripheral   . OSA treated with BiPAP    per study 2007  . Sciatica   . Sigmoid diverticulosis   . Type 2 diabetes mellitus ([place])     Past Surgical History:  Procedure Laterality Date  . BIOPSY N/A 03/17/2013   Procedure: GASTRIC BIOPSIES;  Surgeon: Danie Binder, MD;  [facility];  Service: Endoscopy;  Laterality: N/A;  . BIOPSY  06/10/2015   Procedure: BIOPSY;  Surgeon: Danie Binder, MD;  [facility];  Service: Endoscopy;;  gastric biopsy  . CARDIAC CATHETERIZATION  05-11-2003  dr Shelva Majestic ([facility])   Abnormal cardiolite/   Normal coronary arteries and normal LVF,  ef  63%  . CARDIOVASCULAR STRESS TEST  01-01-2014   normal  lexiscan cardiolite/  no ischemia/ infarct/  normal LV function and wall motion ,  ef 81%  . COLONOSCOPY  last one 10/02/2011   MOD [place] TICS, IH, NEXT TCS APR 2018  . COLONOSCOPY WITH PROPOFOL N/A 02/26/2017   Procedure: COLONOSCOPY WITH PROPOFOL;  Surgeon: Danie Binder, MD;  [facility];  Service: Endoscopy;  Laterality: N/A;  11:00am  . CYSTO/   RIGHT URETEROSOCOPY LASER LITHOTRIPSY STONE EXTRACTION  10-13-2004  . CYSTO/  RIGHT RETROGRADE PYELOGRAM/  PLACMENT RIGHT URETERAL STENT  01-10-2010  . CYSTOSCOPY W/ URETERAL STENT PLACEMENT Right 08/19/2015   Procedure: CYSTOSCOPY WITH RETROGRADE PYELOGRAM/URETERAL STENT PLACEMENT;  Surgeon: Franchot Gallo, MD;  [facility];  Service: Urology;  Laterality: Right;  . CYSTOSCOPY WITH RETROGRADE PYELOGRAM, URETEROSCOPY AND STENT PLACEMENT Left 10/21/2014   Procedure: CYSTOSCOPY WITH RETROGRADE PYELOGRAM, URETEROSCOPY AND STENT PLACEMENT;  Surgeon: Franchot Gallo, MD;  Location: [facility];  Service: Urology;  Laterality: Left;  . CYSTOSCOPY WITH RETROGRADE PYELOGRAM, URETEROSCOPY AND STENT PLACEMENT Right 11/22/2015   Procedure: CYSTOSCOPY, RIGHT URETERAL STENT REMOVAL; RIGHT RETROGRADE PYELOGRAM, RIGHT URETEROSCOPY WITH BALLOON DILATION; RIGHT URETERAL STENT PLACEMENT;  Surgeon:  Franchot Gallo, MD;  [facility];  Service: Urology;  Laterality: Right;  . CYSTOSCOPY/RETROGRADE/URETEROSCOPY/STONE EXTRACTION WITH BASKET Bilateral 08/02/2015   Procedure: CYSTOSCOPY; BILATERAL RETROGRADE PYELOGRAMS; BILATERAL URETEROSCOPY, STONE EXTRACTION WITH BASKET;  Surgeon: Franchot Gallo, MD;  [facility];  Service: Urology;  Laterality: Bilateral;  . CYSTOSCOPY/RETROGRADE/URETEROSCOPY/STONE EXTRACTION WITH BASKET Right 06/28/2015   Procedure: CYSTOSCOPY/RETROGRADE/URETEROSCOPY/STONE EXTRACTION WITH BASKET, RIGHT URETERAL DOUBLE J STENT PLACEMENT;  Surgeon: Franchot Gallo, MD;  [facility];  Service: Urology;   Laterality: Right;  . ESOPHAGOGASTRODUODENOSCOPY (EGD) WITH PROPOFOL N/A 03/17/2013   Procedure: ESOPHAGOGASTRODUODENOSCOPY (EGD) WITH PROPOFOL;  Surgeon: Danie Binder, MD;  [facility];  Service: Endoscopy;  Laterality: N/A;  . ESOPHAGOGASTRODUODENOSCOPY (EGD) WITH PROPOFOL N/A 06/10/2015   Distal gastritis, distal esophageal stricture s/p dilation  . ESOPHAGOGASTRODUODENOSCOPY (EGD) WITH PROPOFOL N/A 02/26/2017   Procedure: ESOPHAGOGASTRODUODENOSCOPY (EGD) WITH PROPOFOL;  Surgeon: Danie Binder, MD;  [facility];  Service: Endoscopy;  Laterality: N/A;  . FLEXIBLE SIGMOIDOSCOPY  09/11/2011   [id number] Internal hemorrhoids  . HOLMIUM LASER APPLICATION Left 5/62/5638   Procedure: HOLMIUM LASER APPLICATION;  Surgeon: Franchot Gallo, MD;  Location: [facility];  Service: Urology;  Laterality: Left;  . HOLMIUM LASER APPLICATION Right 9/37/3428   Procedure: HOLMIUM LASER APPLICATION;  Surgeon: Franchot Gallo, MD;  [facility];  Service: Urology;  Laterality: Right;  . MASS EXCISION Left 03/04/2015   Procedure: EXCISION OF SOFT TISSUE NEOPLASM LEFT ARM;  Surgeon: Aviva Signs, MD;  Location: [facility];  Service: General;  Laterality: Left;  . PERCUTANEOUS NEPHROSTOLITHOTOMY Bilateral 12/  2011     [facility]  . POLYPECTOMY N/A 03/17/2013   Procedure: GASTRIC POLYPECTOMY;  Surgeon: Danie Binder, MD;  [facility];  Service: Endoscopy;  Laterality: N/A;  . POLYPECTOMY  02/26/2017   Procedure: POLYPECTOMY;  Surgeon: Danie Binder, MD;  [facility];  Service: Endoscopy;;  polyp at cecum, ascending colon polyps x3, hepatic flexure polyps x8, transverse colon polyps x8   . REMOVAL RIGHT THIGH CYST  2006  . SAVORY DILATION N/A 03/17/2013   Procedure: SAVORY DILATION;  Surgeon: Danie Binder, MD;  [facility]: [facility];  Service: Endoscopy;  Laterality: N/A;  #12.8, 14, 15, 16 dilators used  . SAVORY DILATION N/A 06/10/2015   Procedure: SAVORY DILATION;   Surgeon: Danie Binder, MD;  [facility];  Service: Endoscopy;  Laterality: N/A;  . TRANSTHORACIC ECHOCARDIOGRAM  01-01-2014   mild LVH/  ef 76-81%/  grade I diastolic dysfunction/  trivial MR, TR, and PR  . VAGINAL HYSTERECTOMY  1970's    There were no vitals filed for this visit.  Subjective Assessment - 08/29/17 0816    Subjective  PT states that she hasn't had a chance to complete her exercises.    Patient is accompained by:  Family member    Pertinent History  TIA 2017, DM, anemia, chronic kidney Dz, fatigut , depression, cancer     Limitations  Standing;Walking;House hold activities    How long can you sit comfortably?  no problem    How long can you stand comfortably?  2:30  tested     How long can you walk comfortably?  less than 3 minutes     Currently in Pain?  Yes    Pain Score  5     Pain Location  -- all over the body  [facility] Adult PT Treatment/Exercise - 08/29/17 0001      Ambulation/Gait   Ambulation Distance (Feet)  310 Feet    Assistive device  None      Exercises   Exercises  Knee/Hip      Knee/Hip Exercises: Standing   Heel Raises  15 reps    Forward Lunges  Both;10 reps    Forward Lunges Limitations  on 4" step     Functional Squat  10 reps    Functional Squat Limitations       SLS  x5 B       Knee/Hip Exercises: Seated   Long Arc Quad  Strengthening;Both;10 reps;Weights    Long Arc Quad Weight  4 lbs.    Sit to [employer]  10 reps             PT Education - 08/28/17 1420    Education provided  Yes    Education Details  HEP    Person(s) Educated  Patient    Methods  Explanation;Demonstration;Handout    Comprehension  Verbalized understanding;Returned demonstration       PT Short Term Goals - 08/29/17 0840      PT SHORT TERM GOAL #1   Title  Pt to be able to single leg stance with both Le for 15 seconds to reduce the risk of falling.     Time  3    Period  Weeks    Status  On-going      PT  SHORT TERM GOAL #2   Title  Pt to be able to ambulate for six minute without resting to show progression in community ambulation.     Time  3    Period  Weeks    Status  On-going      PT SHORT TERM GOAL #3   Title  Pt to be able to complet five sit to stand in 20 seconds or less to demonstrate increased power of LE for funtional activity such as sit to stand, arising from a squatted position.     Time  3    Period  Weeks    Status  On-going        PT Long Term Goals - 08/29/17 0840      PT LONG TERM GOAL #1   Title  Pt to be able ot single leg stance on both LE for 25 seconds to reduce risk of falling     Time  6    Period  Weeks    Status  On-going      PT LONG TERM GOAL #2   Title  Pt to be abl eot ambulate for 15 minutes without resting to become a Hydrographic surveyor.     Time  6    Period  Weeks    Status  On-going      PT LONG TERM GOAL #3   Title  Pt to be able to complet 5 sit to stand in 15 seconds to demonstrate improve power of B LE to arise from lower level objects such as a car.    Time  6    Period  Weeks    Status  On-going      PT LONG TERM GOAL #4   Title  Pt B LE mm grade to increase by one level to allow pt to step  up onto a curb with confidence.     Time  6    Period  Weeks    Status  On-going            Plan - 08/29/17 6429    Clinical Impression Statement  Reviewed evaluation and goals with patient.  Increased distance ambulated, wt with LAQ and repetitions as well as adding forward lunging to program.  PT needed multiple rest breaks.   Pt needs constant cuing for proper technique as well as encouragement.    Rehab Potential  Fair    PT Frequency  2x / week    PT Duration  6 weeks    PT Treatment/Interventions  Gait training;Functional mobility training;Therapeutic activities;Therapeutic exercise;Balance training;Neuromuscular re-education;Patient/family education    PT Next Visit Plan  Ambulate every session with goal of increasing  ambulation distance by 50 ft.  Begin step down activity     PT Home Exercise Plan  eval:   heel raises, squats, sit to stand, SLR, bridge and  sitting ankle Dorsi/plantar flexion .      Consulted and Agree with Plan of Care  Patient       Patient will benefit from skilled therapeutic intervention in order to improve the following deficits and impairments:  Abnormal gait, Decreased activity tolerance, Decreased balance, Decreased mobility, Decreased strength, Difficulty walking, Impaired perceived functional ability, Obesity, Pain  Visit Diagnosis: Muscle weakness (generalized)  Difficulty in walking, not elsewhere classified  Repeated falls     Problem List Patient Active Problem List   Diagnosis Date Noted  . Multiple myeloma ([place]) 03/15/2017  . Urinary frequency 02/28/2017  . SIRS (systemic inflammatory response syndrome) ([place]) 02/26/2017  . 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 Morbid obesity ([place]) 10/14/2016  . Vaginal dryness, menopausal 10/14/2016  . Neuropathic pain of both feet 04/11/2016  . TIA (transient ischemic attack) 02/26/2016  . Lactose intolerance in adult 02/01/2016  . Brain TIA 08/19/2015  . CKD (chronic kidney disease) stage 3, GFR 30-59 ml/min (HCC) 07/12/2015  . Lipoma of arm 01/19/2015  . Anemia 10/14/2014  . Lumbar back pain with radiculopathy affecting left lower extremity 05/11/2014  . Nocturnal hypoxia 02/04/2014  . Generalized anxiety disorder 05/20/2013  . Anxiety and depression 08/13/2012  . Abnormal brain scan 02/07/2012  . Thoracic or lumbosacral neuritis or radiculitis, unspecified 12/22/2010  . Hypercalcemia 11/30/2010  . Colon adenomas 05/11/2009  . HEMORRHOIDS 03/15/2009  . GERD 03/15/2009  . FATTY LIVER DISEASE 03/15/2009  . RENAL CALCULUS 03/15/2009  . Sleep apnea 09/15/2008  . 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 Hyperlipidemia LDL goal <100 09/17/2007  . Hypertension goal BP (blood pressure) < 130/80  09/17/2007    Rayetta Humphrey, PT CLT [phone] 08/29/2017, 9:01 AM  [place], [place], [address] Phone: [phone]   Fax:  [phone]  Name: Cheryl Abbott MRN: [id number] Date of Birth: 04-25-1946

## 2017-08-30 ENCOUNTER — Other Ambulatory Visit (HOSPITAL_COMMUNITY)
Admission: RE | Admit: 2017-08-30 | Discharge: 2017-08-30 | Disposition: A | Discharge status: Home / Self Care | Payer: Medicare Other | Source: Ambulatory Visit | Attending: Nephrology | Admitting: Nephrology

## 2017-08-30 DIAGNOSIS — E876 Hypokalemia: Secondary | ICD-10-CM | POA: Diagnosis not present

## 2017-08-30 DIAGNOSIS — D649 Anemia, unspecified: Secondary | ICD-10-CM | POA: Diagnosis not present

## 2017-08-30 DIAGNOSIS — Z79899 Other long term (current) drug therapy: Secondary | ICD-10-CM | POA: Diagnosis not present

## 2017-08-30 DIAGNOSIS — I129 Hypertensive chronic kidney disease with stage 1 through stage 4 chronic kidney disease, or unspecified chronic kidney disease: Secondary | ICD-10-CM | POA: Diagnosis not present

## 2017-08-30 DIAGNOSIS — N183 Chronic kidney disease, stage 3 (moderate): Secondary | ICD-10-CM | POA: Diagnosis not present

## 2017-08-30 DIAGNOSIS — R809 Proteinuria, unspecified: Secondary | ICD-10-CM | POA: Diagnosis not present

## 2017-08-30 LAB — RENAL FUNCTION PANEL
Albumin: 4.1 g/dL (ref 3.5–5.0)
Anion gap: 13 (ref 5–15)
BUN: 12 mg/dL (ref 6–20)
CO2: 24 mmol/L (ref 22–32)
Calcium: 9.6 mg/dL (ref 8.9–10.3)
Chloride: 100 mmol/L — ABNORMAL LOW (ref 101–111)
Creatinine, Ser: 1.41 mg/dL — ABNORMAL HIGH (ref 0.44–1.00)
GFR calc Af Amer: 42 mL/min — ABNORMAL LOW (ref >60)
GFR calc non Af Amer: 36 mL/min — ABNORMAL LOW (ref >60)
Glucose, Bld: 116 mg/dL — ABNORMAL HIGH (ref 65–99)
Phosphorus: 3.3 mg/dL (ref 2.5–4.6)
Potassium: 3.4 mmol/L — ABNORMAL LOW (ref 3.5–5.1)
Sodium: 137 mmol/L (ref 135–145)

## 2017-08-31 ENCOUNTER — Other Ambulatory Visit: Payer: Self-pay | Admitting: Family Medicine

## 2017-09-03 ENCOUNTER — Encounter (HOSPITAL_COMMUNITY): Payer: Self-pay | Admitting: Occupational Therapy

## 2017-09-03 ENCOUNTER — Ambulatory Visit (HOSPITAL_COMMUNITY): Payer: Medicare Other | Admitting: Physical Therapy

## 2017-09-03 ENCOUNTER — Ambulatory Visit (HOSPITAL_COMMUNITY): Payer: Medicare Other | Admitting: Occupational Therapy

## 2017-09-03 ENCOUNTER — Other Ambulatory Visit: Payer: Self-pay

## 2017-09-03 DIAGNOSIS — M6281 Muscle weakness (generalized): Secondary | ICD-10-CM

## 2017-09-03 DIAGNOSIS — R296 Repeated falls: Secondary | ICD-10-CM | POA: Diagnosis not present

## 2017-09-03 DIAGNOSIS — R29898 Other symptoms and signs involving the musculoskeletal system: Secondary | ICD-10-CM

## 2017-09-03 DIAGNOSIS — R262 Difficulty in walking, not elsewhere classified: Secondary | ICD-10-CM | POA: Diagnosis not present

## 2017-09-03 NOTE — Therapy (Signed)
[place], [place], [address] Phone: [phone]   Fax:  [phone]  Physical Therapy Treatment  Patient Details  Name: Cheryl Abbott MRN: [id number] Date of Birth: 07-14-1945 Referring Provider: Faythe Casa, NP   Encounter Date: 09/03/2017  PT End of Session - 09/03/17 1548    Visit Number  3    Number of Visits  12    Date for PT Re-Evaluation  10/09/17    Authorization Type  medicare    Authorization - Visit Number  3    Authorization - Number of Visits  12    PT Start Time  7893    PT Stop Time  8101    PT Time Calculation (min)  39 min    Activity Tolerance  Patient tolerated treatment well       Past Medical History:  Diagnosis Date  . Anemia   . Anxiety   . Anxiety disorder   . Arthritis   . Cancer ([place])   . 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 Chronic back pain   . Depression   . Fatty liver   . GERD (gastroesophageal reflux disease)   . Headache   . History of adenomatous polyp of colon    2008  . History of kidney stones   . Hypercalcemia   . Hyperlipidemia   . Hypertension   . Left ureteral stone   . Lipoma of arm 01/19/2015  . Lumbar disc disease with radiculopathy   . Nephrolithiasis   . Neuropathy, peripheral   . OSA treated with BiPAP    per study 2007  . Sciatica   . Sigmoid diverticulosis   . Type 2 diabetes mellitus ([place])     Past Surgical History:  Procedure Laterality Date  . BIOPSY N/A 03/17/2013   Procedure: GASTRIC BIOPSIES;  Surgeon: Danie Binder, MD;  [facility];  Service: Endoscopy;  Laterality: N/A;  . BIOPSY  06/10/2015   Procedure: BIOPSY;  Surgeon: Danie Binder, MD;  [facility];  Service: Endoscopy;;  gastric biopsy  . CARDIAC CATHETERIZATION  05-11-2003  dr Shelva Majestic ([facility])   Abnormal cardiolite/   Normal coronary arteries and normal LVF,  ef  63%  . CARDIOVASCULAR STRESS TEST  01-01-2014   normal  lexiscan cardiolite/  no ischemia/ infarct/  normal LV function and wall motion ,  ef 81%  . COLONOSCOPY  last one 10/02/2011   MOD [place] TICS, IH, NEXT TCS APR 2018  . COLONOSCOPY WITH PROPOFOL N/A 02/26/2017   Procedure: COLONOSCOPY WITH PROPOFOL;  Surgeon: Danie Binder, MD;  [facility];  Service: Endoscopy;  Laterality: N/A;  11:00am  . CYSTO/   RIGHT URETEROSOCOPY LASER LITHOTRIPSY STONE EXTRACTION  10-13-2004  . CYSTO/  RIGHT RETROGRADE PYELOGRAM/  PLACMENT RIGHT URETERAL STENT  01-10-2010  . CYSTOSCOPY W/ URETERAL STENT PLACEMENT Right 08/19/2015   Procedure: CYSTOSCOPY WITH RETROGRADE PYELOGRAM/URETERAL STENT PLACEMENT;  Surgeon: Franchot Gallo, MD;  [facility];  Service: Urology;  Laterality: Right;  . CYSTOSCOPY WITH RETROGRADE PYELOGRAM, URETEROSCOPY AND STENT PLACEMENT Left 10/21/2014   Procedure: CYSTOSCOPY WITH RETROGRADE PYELOGRAM, URETEROSCOPY AND STENT PLACEMENT;  Surgeon: Franchot Gallo, MD;  Location: [facility];  Service: Urology;  Laterality: Left;  . CYSTOSCOPY WITH RETROGRADE PYELOGRAM, URETEROSCOPY AND STENT PLACEMENT Right 11/22/2015   Procedure: CYSTOSCOPY, RIGHT URETERAL STENT REMOVAL; RIGHT RETROGRADE PYELOGRAM, RIGHT URETEROSCOPY WITH BALLOON DILATION; RIGHT URETERAL STENT PLACEMENT;  Surgeon:  Franchot Gallo, MD;  [facility];  Service: Urology;  Laterality: Right;  . CYSTOSCOPY/RETROGRADE/URETEROSCOPY/STONE EXTRACTION WITH BASKET Bilateral 08/02/2015   Procedure: CYSTOSCOPY; BILATERAL RETROGRADE PYELOGRAMS; BILATERAL URETEROSCOPY, STONE EXTRACTION WITH BASKET;  Surgeon: Franchot Gallo, MD;  [facility];  Service: Urology;  Laterality: Bilateral;  . CYSTOSCOPY/RETROGRADE/URETEROSCOPY/STONE EXTRACTION WITH BASKET Right 06/28/2015   Procedure: CYSTOSCOPY/RETROGRADE/URETEROSCOPY/STONE EXTRACTION WITH BASKET, RIGHT URETERAL DOUBLE J STENT PLACEMENT;  Surgeon: Franchot Gallo, MD;  [facility];  Service: Urology;   Laterality: Right;  . ESOPHAGOGASTRODUODENOSCOPY (EGD) WITH PROPOFOL N/A 03/17/2013   Procedure: ESOPHAGOGASTRODUODENOSCOPY (EGD) WITH PROPOFOL;  Surgeon: Danie Binder, MD;  [facility];  Service: Endoscopy;  Laterality: N/A;  . ESOPHAGOGASTRODUODENOSCOPY (EGD) WITH PROPOFOL N/A 06/10/2015   Distal gastritis, distal esophageal stricture s/p dilation  . ESOPHAGOGASTRODUODENOSCOPY (EGD) WITH PROPOFOL N/A 02/26/2017   Procedure: ESOPHAGOGASTRODUODENOSCOPY (EGD) WITH PROPOFOL;  Surgeon: Danie Binder, MD;  [facility];  Service: Endoscopy;  Laterality: N/A;  . FLEXIBLE SIGMOIDOSCOPY  09/11/2011   [id number] Internal hemorrhoids  . HOLMIUM LASER APPLICATION Left 8/65/7846   Procedure: HOLMIUM LASER APPLICATION;  Surgeon: Franchot Gallo, MD;  Location: [facility];  Service: Urology;  Laterality: Left;  . HOLMIUM LASER APPLICATION Right 9/62/9528   Procedure: HOLMIUM LASER APPLICATION;  Surgeon: Franchot Gallo, MD;  [facility];  Service: Urology;  Laterality: Right;  . MASS EXCISION Left 03/04/2015   Procedure: EXCISION OF SOFT TISSUE NEOPLASM LEFT ARM;  Surgeon: Aviva Signs, MD;  Location: [facility];  Service: General;  Laterality: Left;  . PERCUTANEOUS NEPHROSTOLITHOTOMY Bilateral 12/  2011     [facility]  . POLYPECTOMY N/A 03/17/2013   Procedure: GASTRIC POLYPECTOMY;  Surgeon: Danie Binder, MD;  [facility];  Service: Endoscopy;  Laterality: N/A;  . POLYPECTOMY  02/26/2017   Procedure: POLYPECTOMY;  Surgeon: Danie Binder, MD;  [facility];  Service: Endoscopy;;  polyp at cecum, ascending colon polyps x3, hepatic flexure polyps x8, transverse colon polyps x8   . REMOVAL RIGHT THIGH CYST  2006  . SAVORY DILATION N/A 03/17/2013   Procedure: SAVORY DILATION;  Surgeon: Danie Binder, MD;  [facility]: [facility];  Service: Endoscopy;  Laterality: N/A;  #12.8, 14, 15, 16 dilators used  . SAVORY DILATION N/A 06/10/2015   Procedure: SAVORY DILATION;   Surgeon: Danie Binder, MD;  [facility];  Service: Endoscopy;  Laterality: N/A;  . TRANSTHORACIC ECHOCARDIOGRAM  01-01-2014   mild LVH/  ef 41-32%/  grade I diastolic dysfunction/  trivial MR, TR, and PR  . VAGINAL HYSTERECTOMY  1970's    There were no vitals filed for this visit.  Subjective Assessment - 09/03/17 1546    Subjective  Pt states she hurts everywhere in her legs and she is tired. Pain makes me want to sit and do nothing but sleep.   Reports she does not do much at home and has lost her HEP.    Currently in Pain?  Yes    Pain Score  5     Pain Location  Generalized    Pain Orientation  Lower    Pain Descriptors / Indicators  Aching;Dull;Throbbing                No data recorded       [facility] Adult PT Treatment/Exercise - 09/03/17 0001      Ambulation/Gait   Ambulation Distance (Feet)  452 Feet in 4 minutes    Assistive device  None  Gait Comments  4' (.57 m/sec)      Knee/Hip Exercises: Standing   Heel Raises  15 reps    Forward Lunges  Both;10 reps    Forward Lunges Limitations  on 4" step     Forward Step Up  Both;10 reps;Hand Hold: 1;Step Height: 4"    Step Down  Both;10 reps;Hand Hold: 1;Step Height: 4"    Functional Squat  10 reps      Knee/Hip Exercises: Seated   Long Arc Quad  Strengthening;Both;Weights;15 reps    Long Arc Quad Weight  4 lbs.    Sit to [employer]  10 reps             PT Education - 09/03/17 1615    Education provided  Yes    Education Details  encouraged to increase activity at home and importance of being compliant with HEP for optimal results with therapy.     Person(s) Educated  Patient    Methods  Explanation    Comprehension  Verbalized understanding       PT Short Term Goals - 08/29/17 0840      PT SHORT TERM GOAL #1   Title  Pt to be able to single leg stance with both Le for 15 seconds to reduce the risk of falling.     Time  3    Period  Weeks    Status  On-going      PT SHORT TERM GOAL  #2   Title  Pt to be able to ambulate for six minute without resting to show progression in community ambulation.     Time  3    Period  Weeks    Status  On-going      PT SHORT TERM GOAL #3   Title  Pt to be able to complet five sit to stand in 20 seconds or less to demonstrate increased power of LE for funtional activity such as sit to stand, arising from a squatted position.     Time  3    Period  Weeks    Status  On-going        PT Long Term Goals - 08/29/17 0840      PT LONG TERM GOAL #1   Title  Pt to be able ot single leg stance on both LE for 25 seconds to reduce risk of falling     Time  6    Period  Weeks    Status  On-going      PT LONG TERM GOAL #2   Title  Pt to be abl eot ambulate for 15 minutes without resting to become a Hydrographic surveyor.     Time  6    Period  Weeks    Status  On-going      PT LONG TERM GOAL #3   Title  Pt to be able to complet 5 sit to stand in 15 seconds to demonstrate improve power of B LE to arise from lower level objects such as a car.    Time  6    Period  Weeks    Status  On-going      PT LONG TERM GOAL #4   Title  Pt B LE mm grade to increase by one level to allow pt to step  up onto a curb with confidence.     Time  6    Period  Weeks    Status  On-going  Plan - 09/03/17 1549    Clinical Impression Statement  Completed ambulation around dept,452 feet in 4 minutes with cues to ambulate with increased speed and without shuffling LE's.  Pt with reported fatigue following ambulation, however no acute distress noted.  Printed off new HEP instructions and reviewed with patient.  Required encouragement and cues to complete correctly.  Pt with generalized malaise requiring frequent rest breaks and encouragment for participation.  Pt c/o all exericses being "hard" and causing pain in her legs (pointing to quadriceps).   Added forward step ups and downs on 4" staircase and patient elected to sit down on step after completed  rather than in chair.  Pt reported it was becuase she was tired.  Pt able to transfer back to standing without assistance, modified independently.      Rehab Potential  Fair    PT Frequency  2x / week    PT Duration  6 weeks    PT Treatment/Interventions  Gait training;Functional mobility training;Therapeutic activities;Therapeutic exercise;Balance training;Neuromuscular re-education;Patient/family education    PT Next Visit Plan  Ambulate every session with goal of increasing ambulation distance by 50 ft. Continue to progress functional strength.  Check on HEP compliance.    PT Home Exercise Plan  eval:   heel raises, squats, sit to stand, SLR, bridge and  sitting ankle Dorsi/plantar flexion .      Consulted and Agree with Plan of Care  Patient       Patient will benefit from skilled therapeutic intervention in order to improve the following deficits and impairments:  Abnormal gait, Decreased activity tolerance, Decreased balance, Decreased mobility, Decreased strength, Difficulty walking, Impaired perceived functional ability, Obesity, Pain  Visit Diagnosis: Muscle weakness (generalized)  Difficulty in walking, not elsewhere classified     Problem List Patient Active Problem List   Diagnosis Date Noted  . Multiple myeloma ([place]) 03/15/2017  . Urinary frequency 02/28/2017  . SIRS (systemic inflammatory response syndrome) ([place]) 02/26/2017  . 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 Morbid obesity ([place]) 10/14/2016  . Vaginal dryness, menopausal 10/14/2016  . Neuropathic pain of both feet 04/11/2016  . TIA (transient ischemic attack) 02/26/2016  . Lactose intolerance in adult 02/01/2016  . Brain TIA 08/19/2015  . CKD (chronic kidney disease) stage 3, GFR 30-59 ml/min (HCC) 07/12/2015  . Lipoma of arm 01/19/2015  . Anemia 10/14/2014  . Lumbar back pain with radiculopathy affecting left lower extremity 05/11/2014  . Nocturnal hypoxia 02/04/2014  . Generalized anxiety disorder  05/20/2013  . Anxiety and depression 08/13/2012  . Abnormal brain scan 02/07/2012  . Thoracic or lumbosacral neuritis or radiculitis, unspecified 12/22/2010  . Hypercalcemia 11/30/2010  . Colon adenomas 05/11/2009  . HEMORRHOIDS 03/15/2009  . GERD 03/15/2009  . FATTY LIVER DISEASE 03/15/2009  . RENAL CALCULUS 03/15/2009  . Sleep apnea 09/15/2008  . Type 2 diabetes with nephropathy ([place]) 02/02/2008  . Hyperlipidemia LDL goal <100 09/17/2007  . Hypertension goal BP (blood pressure) < 130/80 09/17/2007   Teena Irani, PTA/CLT [phone]  Teena Irani 09/03/2017, 4:15 PM  [place], [place], [address] Phone: [phone]   Fax:  [phone]  Name: Cheryl Abbott MRN: [id number] Date of Birth: 07/21/1945

## 2017-09-03 NOTE — Therapy (Signed)
[place], [place], [address] Phone: [phone]   Fax:  [phone]  Occupational Therapy Evaluation  Patient Details  Name: Cheryl Abbott MRN: [id number] Date of Birth: [birth year] Referring Provider: Faythe Casa, NP   Encounter Date: 09/03/2017  OT End of Session - 09/03/17 1711    Visit Number  1    Number of Visits  5    Date for OT Re-Evaluation  10/03/17    Authorization Type  [employer] Medicare    OT Start Time  5400 pt arrived 10 min late and had to wait additional time to check in    OT Stop Time  1516    OT Time Calculation (min)  22 min    Activity Tolerance  Patient tolerated treatment well    Behavior During Therapy  [facility] for tasks assessed/performed       Past Medical History:  Diagnosis Date  . Anemia   . Anxiety   . Anxiety disorder   . Arthritis   . Cancer ([place])   . 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 Chronic back pain   . Depression   . Fatty liver   . GERD (gastroesophageal reflux disease)   . Headache   . History of adenomatous polyp of colon    2008  . History of kidney stones   . Hypercalcemia   . Hyperlipidemia   . Hypertension   . Left ureteral stone   . Lipoma of arm 01/19/2015  . Lumbar disc disease with radiculopathy   . Nephrolithiasis   . Neuropathy, peripheral   . OSA treated with BiPAP    per study 2007  . Sciatica   . Sigmoid diverticulosis   . Type 2 diabetes mellitus ([place])     Past Surgical History:  Procedure Laterality Date  . BIOPSY N/A 03/17/2013   Procedure: GASTRIC BIOPSIES;  Surgeon: Danie Binder, MD;  Location: [facility];  Service: Endoscopy;  Laterality: N/A;  . BIOPSY  06/10/2015   Procedure: BIOPSY;  Surgeon: Danie Binder, MD;  [facility];  Service: Endoscopy;;  gastric biopsy  . CARDIAC CATHETERIZATION  05-11-2003  dr Shelva Majestic ([facility])   Abnormal cardiolite/   Normal coronary arteries and normal LVF,   ef  63%  . CARDIOVASCULAR STRESS TEST  01-01-2014   normal lexiscan cardiolite/  no ischemia/ infarct/  normal LV function and wall motion ,  ef 81%  . COLONOSCOPY  last one 10/02/2011   MOD [place] TICS, IH, NEXT TCS APR 2018  . COLONOSCOPY WITH PROPOFOL N/A 02/26/2017   Procedure: COLONOSCOPY WITH PROPOFOL;  Surgeon: Danie Binder, MD;  [facility];  Service: Endoscopy;  Laterality: N/A;  11:00am  . CYSTO/   RIGHT URETEROSOCOPY LASER LITHOTRIPSY STONE EXTRACTION  10-13-2004  . CYSTO/  RIGHT RETROGRADE PYELOGRAM/  PLACMENT RIGHT URETERAL STENT  01-10-2010  . CYSTOSCOPY W/ URETERAL STENT PLACEMENT Right 08/19/2015   Procedure: CYSTOSCOPY WITH RETROGRADE PYELOGRAM/URETERAL STENT PLACEMENT;  Surgeon: Franchot Gallo, MD;  Location: [facility];  Service: Urology;  Laterality: Right;  . CYSTOSCOPY WITH RETROGRADE PYELOGRAM, URETEROSCOPY AND STENT PLACEMENT Left 10/21/2014   Procedure: CYSTOSCOPY WITH RETROGRADE PYELOGRAM, URETEROSCOPY AND STENT PLACEMENT;  Surgeon: Franchot Gallo, MD;  Location[facility];  Service: Urology;  Laterality: Left;  . CYSTOSCOPY WITH RETROGRADE PYELOGRAM, URETEROSCOPY AND STENT PLACEMENT Right 11/22/2015   Procedure: CYSTOSCOPY, RIGHT URETERAL STENT REMOVAL; RIGHT RETROGRADE PYELOGRAM, RIGHT URETEROSCOPY WITH BALLOON  DILATION; RIGHT URETERAL STENT PLACEMENT;  Surgeon: Franchot Gallo, MD;  Location: [facility];  Service: Urology;  Laterality: Right;  . CYSTOSCOPY/RETROGRADE/URETEROSCOPY/STONE EXTRACTION WITH BASKET Bilateral 08/02/2015   Procedure: CYSTOSCOPY; BILATERAL RETROGRADE PYELOGRAMS; BILATERAL URETEROSCOPY, STONE EXTRACTION WITH BASKET;  Surgeon: Franchot Gallo, MD;  Location: [facility];  Service: Urology;  Laterality: Bilateral;  . CYSTOSCOPY/RETROGRADE/URETEROSCOPY/STONE EXTRACTION WITH BASKET Right 06/28/2015   Procedure: CYSTOSCOPY/RETROGRADE/URETEROSCOPY/STONE EXTRACTION WITH BASKET, RIGHT URETERAL DOUBLE J STENT PLACEMENT;  Surgeon: Franchot Gallo, MD;  [facility]: [facility];  Service: Urology;  Laterality: Right;  . ESOPHAGOGASTRODUODENOSCOPY (EGD) WITH PROPOFOL N/A 03/17/2013   Procedure: ESOPHAGOGASTRODUODENOSCOPY (EGD) WITH PROPOFOL;  Surgeon: Danie Binder, MD;  [facility];  Service: Endoscopy;  Laterality: N/A;  . ESOPHAGOGASTRODUODENOSCOPY (EGD) WITH PROPOFOL N/A 06/10/2015   Distal gastritis, distal esophageal stricture s/p dilation  . ESOPHAGOGASTRODUODENOSCOPY (EGD) WITH PROPOFOL N/A 02/26/2017   Procedure: ESOPHAGOGASTRODUODENOSCOPY (EGD) WITH PROPOFOL;  Surgeon: Danie Binder, MD;  [facility];  Service: Endoscopy;  Laterality: N/A;  . FLEXIBLE SIGMOIDOSCOPY  09/11/2011   JSR:[id number] Internal hemorrhoids  . HOLMIUM LASER APPLICATION Left 2/92/4462   Procedure: HOLMIUM LASER APPLICATION;  Surgeon: Franchot Gallo, MD;  Location: [facility];  Service: Urology;  Laterality: Left;  . HOLMIUM LASER APPLICATION Right 8/63/8177   Procedure: HOLMIUM LASER APPLICATION;  Surgeon: Franchot Gallo, MD;  Location[facility];  Service: Urology;  Laterality: Right;  . MASS EXCISION Left 03/04/2015   Procedure: EXCISION OF SOFT TISSUE NEOPLASM LEFT ARM;  Surgeon: Aviva Signs, MD;  Location: [facility];  Service: General;  Laterality: Left;  . PERCUTANEOUS NEPHROSTOLITHOTOMY Bilateral 12/  2011     [facility]  . POLYPECTOMY N/A 03/17/2013   Procedure: GASTRIC POLYPECTOMY;  Surgeon: Danie Binder, MD;  Location[facility];  Service: Endoscopy;  Laterality: N/A;  . POLYPECTOMY  02/26/2017   Procedure: POLYPECTOMY;  Surgeon: Danie Binder, MD;  [facility];  Service: Endoscopy;;  polyp at cecum, ascending colon polyps x3, hepatic flexure polyps x8, transverse colon polyps x8   . REMOVAL RIGHT THIGH CYST  2006  . SAVORY DILATION N/A 03/17/2013   Procedure: SAVORY DILATION;  Surgeon: Danie Binder, MD;  Location: [facility];  Service: Endoscopy;  Laterality: N/A;  #12.8, 14, 15, 16 dilators used  . SAVORY  DILATION N/A 06/10/2015   Procedure: SAVORY DILATION;  Surgeon: Danie Binder, MD;  [facility];  Service: Endoscopy;  Laterality: N/A;  . TRANSTHORACIC ECHOCARDIOGRAM  01-01-2014   mild LVH/  ef 11-65%/  grade I diastolic dysfunction/  trivial MR, TR, and PR  . VAGINAL HYSTERECTOMY  1970's    There were no vitals filed for this visit.  Subjective Assessment - 09/03/17 1706    Subjective   S: I'm just tired all the time.     Pertinent History  Pt is a 72 y/o female presenting with fatigue and poor activity tolerance ongoing since February. Pt was referred to occupational therapy for evaluation and treatment by Faythe Casa, NP.     Limitations  Pt with dx of myeloma     Patient Stated Goals  To have more energy     Currently in Pain?  No/denies        [facility] OT Assessment - 09/03/17 1456      Assessment   Medical Diagnosis  Weakness and fatigue    Referring Provider  Faythe Casa, NP    Onset Date/Surgical Date  07/12/17    Next  MD Visit  unknown    Prior Therapy  last year       Precautions   Precautions  Fall      Restrictions   Weight Bearing Restrictions  No      Balance Screen   Has the patient fallen in the past 6 months  Yes    How many times?  2    Has the patient had a decrease in activity level because of a fear of falling?   No    Is the patient reluctant to leave their home because of a fear of falling?   No      Prior Function   Level of Independence  Independent with basic ADLs    Vocation  Retired    Nature conservation officer, sewing, crocheting      ADL   ADL comments  Pt is fatigued constantly limiting her ability to complete ADLs. States it can take her over an hour to shower and dress. Pt is having difficulty with meal preparation due to fatigue and weakness. Pt is having difficulty bringing in groceries due to weakness.       Written Expression   Dominant Hand  Right      Activity Tolerance   Activity Tolerance  Tolerates 10-20 min  activity with multiple rests    Activity Tolerance Comments  Pt fatigues easily, requiring significantly increased time and rest breaks to complete tasks.       Cognition   Overall Cognitive Status  Within Functional Limits for tasks assessed      ROM / Strength   AROM / PROM / Strength  Strength      Strength   Strength Assessment Site  Shoulder;Elbow;Forearm;Wrist;Hand    Right/Left Shoulder  Right;Left    Right Shoulder Flexion  5/5    Right Shoulder ABduction  4-/5    Right Shoulder Internal Rotation  4-/5    Right Shoulder External Rotation  4-/5    Left Shoulder Flexion  5/5    Left Shoulder ABduction  4-/5    Left Shoulder Internal Rotation  4-/5    Left Shoulder External Rotation  4-/5    Right/Left Elbow  Right;Left    Right Elbow Flexion  4-/5    Right Elbow Extension  4-/5    Left Elbow Flexion  4-/5    Left Elbow Extension  4-/5    Right/Left Forearm  Right;Left    Right Forearm Pronation  5/5    Right Forearm Supination  5/5    Left Forearm Pronation  5/5    Left Forearm Supination  5/5    Right/Left Wrist  Right;Left    Right Wrist Flexion  5/5    Right Wrist Extension  5/5    Right Wrist Radial Deviation  5/5    Right Wrist Ulnar Deviation  5/5    Left Wrist Flexion  5/5    Left Wrist Extension  5/5    Left Wrist Radial Deviation  5/5    Left Wrist Ulnar Deviation  5/5    Right/Left hand  Right;Left    Right Hand Gross Grasp  Functional    Right Hand Grip (lbs)  64    Right Hand Lateral Pinch  18 lbs    Right Hand 3 Point Pinch  12 lbs    Left Hand Gross Grasp  Functional    Left Hand Grip (lbs)  56    Left Hand Lateral Pinch  16 lbs  Left Hand 3 Point Pinch  10 lbs                      OT Education - 09/03/17 1705    Education provided  Yes    Education Details  educated on areas OT will address; unable to provide exercise HEP due to time constraints    Person(s) Educated  Patient    Methods  Explanation    Comprehension   Verbalized understanding       OT Short Term Goals - 09/03/17 1716      OT SHORT TERM GOAL #1   Title  Pt will be provided with and educated on HEP to improve BUE strength and activity tolerance required for ADL completion.     Time  4    Period  Weeks    Status  New    Target Date  10/03/17      OT SHORT TERM GOAL #2   Title  Pt will be educated on and actively implement energy conservation strategies to improve activity tolerance and independence in ADL completion.     Time  4    Period  Weeks    Status  New      OT SHORT TERM GOAL #3   Title  Pt will improve BUE strength to 4+/5 to increase ability to perform meal preparation tasks including carrying groceries and lifting pots and pans.     Time  4    Period  Weeks    Status  New      OT SHORT TERM GOAL #4   Title  Pt will be educated on compensatory strategies and techniques for ADL completion to improve independence and daily activity tolerance.     Time  4    Period  Weeks    Status  New      OT SHORT TERM GOAL #5   Title  Pt will be educated on cognitive strategies to compensate for memory issues.     Time  4    Period  Weeks    Status  New               Plan - 09/03/17 1712    Clinical Impression Statement  A: Pt is a 72 y/o female with dx of multiple myeloma, fatigue, and TIA presenting with weakness and profound fatigue, severely limiting her activity tolerance required for B/ADL completion. Pt presents with fair to good strength in BUE, however states ADL are becoming more and more difficult due to fatigue. Pt is not currently using any energy conservation techniques at home.     Occupational Profile and client history currently impacting functional performance  Pt would like to be able to complete her own housework and cook her own meals. Is motivated to return to these activities    Occupational performance deficits (Please refer to evaluation for details):  ADL's;IADL's;Leisure;Social Participation     Rehab Potential  Fair    OT Frequency  1x / week    OT Duration  4 weeks    OT Treatment/Interventions  Self-care/ADL training;Therapeutic exercise;Therapeutic activities;Patient/family education    Plan  P: Pt will benefit from skilled OT services to improve safety and independence in ADL completion, as well as improve strength and activity tolerance. Treatment plan: Educated and implementation of energy conservation techniques, BUE strengthening, ADL training    Clinical Decision Making  Several treatment options, min-mod task modification necessary    Consulted and Agree with  Plan of Care  Patient       Patient will benefit from skilled therapeutic intervention in order to improve the following deficits and impairments:  Decreased endurance, Decreased activity tolerance, Decreased strength, Impaired UE functional use, Decreased safety awareness  Visit Diagnosis: Muscle weakness (generalized)  Other symptoms and signs involving the musculoskeletal system    Problem List Patient Active Problem List   Diagnosis Date Noted  . Multiple myeloma ([place]) 03/15/2017  . Urinary frequency 02/28/2017  . SIRS (systemic inflammatory response syndrome) ([place]) 02/26/2017  . 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 Morbid obesity ([place]) 10/14/2016  . Vaginal dryness, menopausal 10/14/2016  . Neuropathic pain of both feet 04/11/2016  . TIA (transient ischemic attack) 02/26/2016  . Lactose intolerance in adult 02/01/2016  . Brain TIA 08/19/2015  . CKD (chronic kidney disease) stage 3, GFR 30-59 ml/min ([facility]) 07/12/2015  . Lipoma of arm 01/19/2015  . Anemia 10/14/2014  . Lumbar back pain with radiculopathy affecting left lower extremity 05/11/2014  . Nocturnal hypoxia 02/04/2014  . Generalized anxiety disorder 05/20/2013  . Anxiety and depression 08/13/2012  . Abnormal brain scan 02/07/2012  . Thoracic or lumbosacral neuritis or radiculitis, unspecified 12/22/2010  . Hypercalcemia 11/30/2010  .  Colon adenomas 05/11/2009  . HEMORRHOIDS 03/15/2009  . GERD 03/15/2009  . FATTY LIVER DISEASE 03/15/2009  . RENAL CALCULUS 03/15/2009  . Sleep apnea 09/15/2008  . 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 Hyperlipidemia LDL goal <100 09/17/2007  . Hypertension goal BP (blood pressure) < 130/80 09/17/2007   Guadelupe Sabin, OTR/L  [phone] 09/03/2017, 5:23 PM  [place], [place], [address] Phone: [phone]   Fax:  [phone]  Name: Cheryl Abbott MRN: [id number] Date of Birth: 1946/04/17

## 2017-09-05 ENCOUNTER — Ambulatory Visit (HOSPITAL_COMMUNITY): Payer: Medicare Other

## 2017-09-05 DIAGNOSIS — R296 Repeated falls: Secondary | ICD-10-CM | POA: Diagnosis not present

## 2017-09-05 DIAGNOSIS — M6281 Muscle weakness (generalized): Secondary | ICD-10-CM

## 2017-09-05 DIAGNOSIS — R262 Difficulty in walking, not elsewhere classified: Secondary | ICD-10-CM

## 2017-09-05 DIAGNOSIS — R29898 Other symptoms and signs involving the musculoskeletal system: Secondary | ICD-10-CM

## 2017-09-05 NOTE — Therapy (Signed)
[place], [place], [address] Phone: [phone]   Fax:  [phone]  Physical Therapy Treatment  Patient Details  Name: Cheryl Abbott MRN: [id number] Date of Birth: 1945/10/11 Referring Provider: Faythe Casa, NP   Encounter Date: 09/05/2017  PT End of Session - 09/05/17 1323    Visit Number  4    Number of Visits  12    Date for PT Re-Evaluation  10/09/17    Authorization Type  medicare    Authorization - Visit Number  4    Authorization - Number of Visits  12    PT Start Time  6811    PT Stop Time  1344    PT Time Calculation (min)  39 min    Activity Tolerance  Patient tolerated treatment well;Patient limited by pain;Patient limited by lethargy    Behavior During Therapy  [facility] for tasks assessed/performed       Past Medical History:  Diagnosis Date  . Anemia   . Anxiety   . Anxiety disorder   . Arthritis   . Cancer ([place])   . 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 Chronic back pain   . Depression   . Fatty liver   . GERD (gastroesophageal reflux disease)   . Headache   . History of adenomatous polyp of colon    2008  . History of kidney stones   . Hypercalcemia   . Hyperlipidemia   . Hypertension   . Left ureteral stone   . Lipoma of arm 01/19/2015  . Lumbar disc disease with radiculopathy   . Nephrolithiasis   . Neuropathy, peripheral   . OSA treated with BiPAP    per study 2007  . Sciatica   . Sigmoid diverticulosis   . Type 2 diabetes mellitus ([place])     Past Surgical History:  Procedure Laterality Date  . BIOPSY N/A 03/17/2013   Procedure: GASTRIC BIOPSIES;  Surgeon: Danie Binder, MD;  Location: [facility];  Service: Endoscopy;  Laterality: N/A;  . BIOPSY  06/10/2015   Procedure: BIOPSY;  Surgeon: Danie Binder, MD;  [facility];  Service: Endoscopy;;  gastric biopsy  . CARDIAC CATHETERIZATION  05-11-2003  dr Shelva Majestic ([facility])   Abnormal  cardiolite/   Normal coronary arteries and normal LVF,  ef  63%  . CARDIOVASCULAR STRESS TEST  01-01-2014   normal lexiscan cardiolite/  no ischemia/ infarct/  normal LV function and wall motion ,  ef 81%  . COLONOSCOPY  last one 10/02/2011   MOD [place] TICS, IH, NEXT TCS APR 2018  . COLONOSCOPY WITH PROPOFOL N/A 02/26/2017   Procedure: COLONOSCOPY WITH PROPOFOL;  Surgeon: Danie Binder, MD;  [facility];  Service: Endoscopy;  Laterality: N/A;  11:00am  . CYSTO/   RIGHT URETEROSOCOPY LASER LITHOTRIPSY STONE EXTRACTION  10-13-2004  . CYSTO/  RIGHT RETROGRADE PYELOGRAM/  PLACMENT RIGHT URETERAL STENT  01-10-2010  . CYSTOSCOPY W/ URETERAL STENT PLACEMENT Right 08/19/2015   Procedure: CYSTOSCOPY WITH RETROGRADE PYELOGRAM/URETERAL STENT PLACEMENT;  Surgeon: Franchot Gallo, MD;  Location: [facility];  Service: Urology;  Laterality: Right;  . CYSTOSCOPY WITH RETROGRADE PYELOGRAM, URETEROSCOPY AND STENT PLACEMENT Left 10/21/2014   Procedure: CYSTOSCOPY WITH RETROGRADE PYELOGRAM, URETEROSCOPY AND STENT PLACEMENT;  Surgeon: Franchot Gallo, MD;  Location: [facility];  Service: Urology;  Laterality: Left;  . CYSTOSCOPY WITH RETROGRADE PYELOGRAM, URETEROSCOPY AND STENT PLACEMENT Right 11/22/2015   Procedure: CYSTOSCOPY, RIGHT  URETERAL STENT REMOVAL; RIGHT RETROGRADE PYELOGRAM, RIGHT URETEROSCOPY WITH BALLOON DILATION; RIGHT URETERAL STENT PLACEMENT;  Surgeon: Franchot Gallo, MD;  Location: [facility];  Service: Urology;  Laterality: Right;  . CYSTOSCOPY/RETROGRADE/URETEROSCOPY/STONE EXTRACTION WITH BASKET Bilateral 08/02/2015   Procedure: CYSTOSCOPY; BILATERAL RETROGRADE PYELOGRAMS; BILATERAL URETEROSCOPY, STONE EXTRACTION WITH BASKET;  Surgeon: Franchot Gallo, MD;  [facility]: [facility];  Service: Urology;  Laterality: Bilateral;  . CYSTOSCOPY/RETROGRADE/URETEROSCOPY/STONE EXTRACTION WITH BASKET Right 06/28/2015   Procedure: CYSTOSCOPY/RETROGRADE/URETEROSCOPY/STONE EXTRACTION WITH BASKET,  RIGHT URETERAL DOUBLE J STENT PLACEMENT;  Surgeon: Franchot Gallo, MD;  [facility]: [facility];  Service: Urology;  Laterality: Right;  . ESOPHAGOGASTRODUODENOSCOPY (EGD) WITH PROPOFOL N/A 03/17/2013   Procedure: ESOPHAGOGASTRODUODENOSCOPY (EGD) WITH PROPOFOL;  Surgeon: Danie Binder, MD;  [facility];  Service: Endoscopy;  Laterality: N/A;  . ESOPHAGOGASTRODUODENOSCOPY (EGD) WITH PROPOFOL N/A 06/10/2015   Distal gastritis, distal esophageal stricture s/p dilation  . ESOPHAGOGASTRODUODENOSCOPY (EGD) WITH PROPOFOL N/A 02/26/2017   Procedure: ESOPHAGOGASTRODUODENOSCOPY (EGD) WITH PROPOFOL;  Surgeon: Danie Binder, MD;  [facility];  Service: Endoscopy;  Laterality: N/A;  . FLEXIBLE SIGMOIDOSCOPY  09/11/2011   SWN:[id number] Internal hemorrhoids  . HOLMIUM LASER APPLICATION Left 0/93/8182   Procedure: HOLMIUM LASER APPLICATION;  Surgeon: Franchot Gallo, MD;  [facility];  Service: Urology;  Laterality: Left;  . HOLMIUM LASER APPLICATION Right 9/93/7169   Procedure: HOLMIUM LASER APPLICATION;  Surgeon: Franchot Gallo, MD;  Location: [facility];  Service: Urology;  Laterality: Right;  . MASS EXCISION Left 03/04/2015   Procedure: EXCISION OF SOFT TISSUE NEOPLASM LEFT ARM;  Surgeon: Aviva Signs, MD;  Location: [facility];  Service: General;  Laterality: Left;  . PERCUTANEOUS NEPHROSTOLITHOTOMY Bilateral 12/  2011     [facility]  . POLYPECTOMY N/A 03/17/2013   Procedure: GASTRIC POLYPECTOMY;  Surgeon: Danie Binder, MD;  [facility]: [facility];  Service: Endoscopy;  Laterality: N/A;  . POLYPECTOMY  02/26/2017   Procedure: POLYPECTOMY;  Surgeon: Danie Binder, MD;  [facility];  Service: Endoscopy;;  polyp at cecum, ascending colon polyps x3, hepatic flexure polyps x8, transverse colon polyps x8   . REMOVAL RIGHT THIGH CYST  2006  . SAVORY DILATION N/A 03/17/2013   Procedure: SAVORY DILATION;  Surgeon: Danie Binder, MD;  Location: [facility];  Service: Endoscopy;   Laterality: N/A;  #12.8, 14, 15, 16 dilators used  . SAVORY DILATION N/A 06/10/2015   Procedure: SAVORY DILATION;  Surgeon: Danie Binder, MD;  [facility];  Service: Endoscopy;  Laterality: N/A;  . TRANSTHORACIC ECHOCARDIOGRAM  01-01-2014   mild LVH/  ef 67-89%/  grade I diastolic dysfunction/  trivial MR, TR, and PR  . VAGINAL HYSTERECTOMY  1970's    There were no vitals filed for this visit.  Subjective Assessment - 09/05/17 1309    Subjective  Pt reports continues to have limting pain in low back and her knees, constant, bu tbetter with leg elevation. Her HEP she has focused on walking, but also on seated exercises d/t concerns of falling for legs giving out.     Pertinent History  TIA 2017, DM, anemia, chronic kidney Dz, fatigut , depression, cancer                 No data recorded       [facility] Adult PT Treatment/Exercise - 09/05/17 0001      Ambulation/Gait   Ambulation Distance (Feet)  450 Feet 2x294f: 3:17, 4:37.    Gait velocity  0.353m; 0.2569mpt  reports she is more tired this session than previous      Knee/Hip Exercises: Standing   Heel Raises  2 sets;10 reps BUE support and forward weight shift encouraged to allow for    Hip Flexion  Both;2 sets;10 reps alternating from chair    Forward Lunges Limitations  -- unable to for time.     Forward Step Up  -- unable to for time.     Step Down  -- unable to for time.     Functional Squat  3 sets;5 sets      Knee/Hip Exercises: Seated   Long Arc Quad  Strengthening;Both;Weights;15 reps;2 sets    Long Arc Quad Weight  4 lbs.    Sit to [employer]  -- 3x5             PT Education - 09/05/17 1322    Education provided  Yes    Education Details  improtance of stregnthening prior to chemo treatments    Person(s) Educated  Patient    Methods  Explanation;Demonstration    Comprehension  Need further instruction       PT Short Term Goals - 08/29/17 0840      PT SHORT TERM GOAL #1   Title  Pt  to be able to single leg stance with both Le for 15 seconds to reduce the risk of falling.     Time  3    Period  Weeks    Status  On-going      PT SHORT TERM GOAL #2   Title  Pt to be able to ambulate for six minute without resting to show progression in community ambulation.     Time  3    Period  Weeks    Status  On-going      PT SHORT TERM GOAL #3   Title  Pt to be able to complet five sit to stand in 20 seconds or less to demonstrate increased power of LE for funtional activity such as sit to stand, arising from a squatted position.     Time  3    Period  Weeks    Status  On-going        PT Long Term Goals - 08/29/17 0840      PT LONG TERM GOAL #1   Title  Pt to be able ot single leg stance on both LE for 25 seconds to reduce risk of falling     Time  6    Period  Weeks    Status  On-going      PT LONG TERM GOAL #2   Title  Pt to be abl eot ambulate for 15 minutes without resting to become a Hydrographic surveyor.     Time  6    Period  Weeks    Status  On-going      PT LONG TERM GOAL #3   Title  Pt to be able to complet 5 sit to stand in 15 seconds to demonstrate improve power of B LE to arise from lower level objects such as a car.    Time  6    Period  Weeks    Status  On-going      PT LONG TERM GOAL #4   Title  Pt B LE mm grade to increase by one level to allow pt to step  up onto a curb with confidence.     Time  6    Period  Weeks  Status  On-going            Plan - 09/05/17 1325    Clinical Impression Statement  Pt heavily limited by fatigue globally adn multi jointpain, bu tremains motivated to perform with breaks as needed. AMB is very slow, with SOB after 175'. She has many questions about how chemo will affect her and is directed toward her oncologist. for detailed information. Pt porgressing in AMB tolerance adn strengthening gradually. Gait speed drastically decreasing over 2 trials, and much slower than last session. Pt reports feeling much  more tired this date.     Rehab Potential  Fair    PT Frequency  2x / week    PT Duration  6 weeks    PT Treatment/Interventions  Gait training;Functional mobility training;Therapeutic activities;Therapeutic exercise;Balance training;Neuromuscular re-education;Patient/family education    PT Next Visit Plan  Ambulate every session with goal of increasing ambulation distance by 50 ft. Continue to progress functional strength.  Check on HEP compliance.    PT Home Exercise Plan  eval:   heel raises, squats, sit to stand, SLR, bridge and  sitting ankle Dorsi/plantar flexion .         Patient will benefit from skilled therapeutic intervention in order to improve the following deficits and impairments:  Abnormal gait, Decreased activity tolerance, Decreased balance, Decreased mobility, Decreased strength, Difficulty walking, Impaired perceived functional ability, Obesity, Pain  Visit Diagnosis: Muscle weakness (generalized)  Other symptoms and signs involving the musculoskeletal system  Difficulty in walking, not elsewhere classified  Repeated falls     Problem List Patient Active Problem List   Diagnosis Date Noted  . Multiple myeloma ([place]) 03/15/2017  . Urinary frequency 02/28/2017  . SIRS (systemic inflammatory response syndrome) ([place]) 02/26/2017  . 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 Morbid obesity ([place]) 10/14/2016  . Vaginal dryness, menopausal 10/14/2016  . Neuropathic pain of both feet 04/11/2016  . TIA (transient ischemic attack) 02/26/2016  . Lactose intolerance in adult 02/01/2016  . Brain TIA 08/19/2015  . CKD (chronic kidney disease) stage 3, GFR 30-59 ml/min ([facility]) 07/12/2015  . Lipoma of arm 01/19/2015  . Anemia 10/14/2014  . Lumbar back pain with radiculopathy affecting left lower extremity 05/11/2014  . Nocturnal hypoxia 02/04/2014  . Generalized anxiety disorder 05/20/2013  . Anxiety and depression 08/13/2012  . Abnormal brain scan 02/07/2012  . Thoracic or  lumbosacral neuritis or radiculitis, unspecified 12/22/2010  . Hypercalcemia 11/30/2010  . Colon adenomas 05/11/2009  . HEMORRHOIDS 03/15/2009  . GERD 03/15/2009  . FATTY LIVER DISEASE 03/15/2009  . RENAL CALCULUS 03/15/2009  . Sleep apnea 09/15/2008  . 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 Hyperlipidemia LDL goal <100 09/17/2007  . Hypertension goal BP (blood pressure) < 130/80 09/17/2007    1:45 PM, 09/05/17 Etta Grandchild, PT, DPT Physical Therapist - [place] [phone] (Office)    Etta Grandchild 09/05/2017, 1:44 PM  [place] [address] [place], [place], [address] Phone: [phone]   Fax:  [phone]  Name: DEUNDRA FURBER MRN: [id number] Date of Birth: Aug 09, 1945

## 2017-09-10 ENCOUNTER — Encounter (HOSPITAL_COMMUNITY): Payer: Self-pay | Admitting: Physical Therapy

## 2017-09-10 ENCOUNTER — Ambulatory Visit (INDEPENDENT_AMBULATORY_CARE_PROVIDER_SITE_OTHER): Payer: Medicare Other | Admitting: Cardiology

## 2017-09-10 ENCOUNTER — Ambulatory Visit (HOSPITAL_COMMUNITY): Payer: Medicare Other | Attending: Family Medicine | Admitting: Physical Therapy

## 2017-09-10 ENCOUNTER — Encounter: Payer: Self-pay | Admitting: Cardiology

## 2017-09-10 VITALS — BP 122/70 | HR 89 | Ht 66.0 in | Wt 209.0 lb

## 2017-09-10 DIAGNOSIS — R296 Repeated falls: Secondary | ICD-10-CM | POA: Diagnosis not present

## 2017-09-10 DIAGNOSIS — I10 Essential (primary) hypertension: Secondary | ICD-10-CM

## 2017-09-10 DIAGNOSIS — R262 Difficulty in walking, not elsewhere classified: Secondary | ICD-10-CM

## 2017-09-10 DIAGNOSIS — M6281 Muscle weakness (generalized): Secondary | ICD-10-CM | POA: Diagnosis not present

## 2017-09-10 DIAGNOSIS — R29898 Other symptoms and signs involving the musculoskeletal system: Secondary | ICD-10-CM | POA: Diagnosis not present

## 2017-09-10 MED ORDER — POTASSIUM CHLORIDE CRYS ER 20 MEQ PO TBCR: 20.0000 meq | EXTENDED_RELEASE_TABLET | Freq: Every day | ORAL | 1 refills | Status: DC

## 2017-09-10 NOTE — Therapy (Signed)
[place], [place], [address] Phone: [phone]   Fax:  [phone]  Physical Therapy Treatment  Patient Details  Name: Cheryl Abbott MRN: [id number] Date of Birth: 19-Jul-1945 Referring Provider: Faythe Casa, NP   Encounter Date: 09/10/2017  PT End of Session - 09/10/17 1735    Visit Number  5    Number of Visits  12    Date for PT Re-Evaluation  10/09/17    Authorization Type  medicare    Authorization - Visit Number  5    Authorization - Number of Visits  12    PT Start Time  3903    PT Stop Time  0092    PT Time Calculation (min)  42 min    Activity Tolerance  Patient tolerated treatment well;Patient limited by pain;Patient limited by lethargy    Behavior During Therapy  [facility] for tasks assessed/performed       Past Medical History:  Diagnosis Date  . Anemia   . Anxiety   . Anxiety disorder   . Arthritis   . Cancer ([place])   . 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 Chronic back pain   . Depression   . Fatty liver   . GERD (gastroesophageal reflux disease)   . Headache   . History of adenomatous polyp of colon    2008  . History of kidney stones   . Hypercalcemia   . Hyperlipidemia   . Hypertension   . Left ureteral stone   . Lipoma of arm 01/19/2015  . Lumbar disc disease with radiculopathy   . Nephrolithiasis   . Neuropathy, peripheral   . OSA treated with BiPAP    per study 2007  . Sciatica   . Sigmoid diverticulosis   . Type 2 diabetes mellitus ([place])     Past Surgical History:  Procedure Laterality Date  . BIOPSY N/A 03/17/2013   Procedure: GASTRIC BIOPSIES;  Surgeon: Danie Binder, MD;  Location: [facility];  Service: Endoscopy;  Laterality: N/A;  . BIOPSY  06/10/2015   Procedure: BIOPSY;  Surgeon: Danie Binder, MD;  [facility];  Service: Endoscopy;;  gastric biopsy  . CARDIAC CATHETERIZATION  05-11-2003  dr Shelva Majestic ([facility])   Abnormal  cardiolite/   Normal coronary arteries and normal LVF,  ef  63%  . CARDIOVASCULAR STRESS TEST  01-01-2014   normal lexiscan cardiolite/  no ischemia/ infarct/  normal LV function and wall motion ,  ef 81%  . COLONOSCOPY  last one 10/02/2011   MOD [place] TICS, IH, NEXT TCS APR 2018  . COLONOSCOPY WITH PROPOFOL N/A 02/26/2017   Procedure: COLONOSCOPY WITH PROPOFOL;  Surgeon: Danie Binder, MD;  [facility];  Service: Endoscopy;  Laterality: N/A;  11:00am  . CYSTO/   RIGHT URETEROSOCOPY LASER LITHOTRIPSY STONE EXTRACTION  10-13-2004  . CYSTO/  RIGHT RETROGRADE PYELOGRAM/  PLACMENT RIGHT URETERAL STENT  01-10-2010  . CYSTOSCOPY W/ URETERAL STENT PLACEMENT Right 08/19/2015   Procedure: CYSTOSCOPY WITH RETROGRADE PYELOGRAM/URETERAL STENT PLACEMENT;  Surgeon: Franchot Gallo, MD;  Location: [facility];  Service: Urology;  Laterality: Right;  . CYSTOSCOPY WITH RETROGRADE PYELOGRAM, URETEROSCOPY AND STENT PLACEMENT Left 10/21/2014   Procedure: CYSTOSCOPY WITH RETROGRADE PYELOGRAM, URETEROSCOPY AND STENT PLACEMENT;  Surgeon: Franchot Gallo, MD;  Location[facility];  Service: Urology;  Laterality: Left;  . CYSTOSCOPY WITH RETROGRADE PYELOGRAM, URETEROSCOPY AND STENT PLACEMENT Right 11/22/2015   Procedure: CYSTOSCOPY, RIGHT  URETERAL STENT REMOVAL; RIGHT RETROGRADE PYELOGRAM, RIGHT URETEROSCOPY WITH BALLOON DILATION; RIGHT URETERAL STENT PLACEMENT;  Surgeon: Franchot Gallo, MD;  Location: [facility];  Service: Urology;  Laterality: Right;  . CYSTOSCOPY/RETROGRADE/URETEROSCOPY/STONE EXTRACTION WITH BASKET Bilateral 08/02/2015   Procedure: CYSTOSCOPY; BILATERAL RETROGRADE PYELOGRAMS; BILATERAL URETEROSCOPY, STONE EXTRACTION WITH BASKET;  Surgeon: Franchot Gallo, MD;  Location: [facility];  Service: Urology;  Laterality: Bilateral;  . CYSTOSCOPY/RETROGRADE/URETEROSCOPY/STONE EXTRACTION WITH BASKET Right 06/28/2015   Procedure: CYSTOSCOPY/RETROGRADE/URETEROSCOPY/STONE EXTRACTION WITH BASKET,  RIGHT URETERAL DOUBLE J STENT PLACEMENT;  Surgeon: Franchot Gallo, MD;  Location: [facility];  Service: Urology;  Laterality: Right;  . ESOPHAGOGASTRODUODENOSCOPY (EGD) WITH PROPOFOL N/A 03/17/2013   Procedure: ESOPHAGOGASTRODUODENOSCOPY (EGD) WITH PROPOFOL;  Surgeon: Danie Binder, MD;  [facility]: [facility];  Service: Endoscopy;  Laterality: N/A;  . ESOPHAGOGASTRODUODENOSCOPY (EGD) WITH PROPOFOL N/A 06/10/2015   Distal gastritis, distal esophageal stricture s/p dilation  . ESOPHAGOGASTRODUODENOSCOPY (EGD) WITH PROPOFOL N/A 02/26/2017   Procedure: ESOPHAGOGASTRODUODENOSCOPY (EGD) WITH PROPOFOL;  Surgeon: Danie Binder, MD;  [facility];  Service: Endoscopy;  Laterality: N/A;  . FLEXIBLE SIGMOIDOSCOPY  09/11/2011   VWU:[id number] Internal hemorrhoids  . HOLMIUM LASER APPLICATION Left 02/07/5620   Procedure: HOLMIUM LASER APPLICATION;  Surgeon: Franchot Gallo, MD;  Location[facility];  Service: Urology;  Laterality: Left;  . HOLMIUM LASER APPLICATION Right 08/16/6576   Procedure: HOLMIUM LASER APPLICATION;  Surgeon: Franchot Gallo, MD;  Location: [facility];  Service: Urology;  Laterality: Right;  . MASS EXCISION Left 03/04/2015   Procedure: EXCISION OF SOFT TISSUE NEOPLASM LEFT ARM;  Surgeon: Aviva Signs, MD;  Location: [facility];  Service: General;  Laterality: Left;  . PERCUTANEOUS NEPHROSTOLITHOTOMY Bilateral 12/  2011     [facility]  . POLYPECTOMY N/A 03/17/2013   Procedure: GASTRIC POLYPECTOMY;  Surgeon: Danie Binder, MD;  Location: [facility];  Service: Endoscopy;  Laterality: N/A;  . POLYPECTOMY  02/26/2017   Procedure: POLYPECTOMY;  Surgeon: Danie Binder, MD;  [facility];  Service: Endoscopy;;  polyp at cecum, ascending colon polyps x3, hepatic flexure polyps x8, transverse colon polyps x8   . REMOVAL RIGHT THIGH CYST  2006  . SAVORY DILATION N/A 03/17/2013   Procedure: SAVORY DILATION;  Surgeon: Danie Binder, MD;  Location: [facility];  Service: Endoscopy;   Laterality: N/A;  #12.8, 14, 15, 16 dilators used  . SAVORY DILATION N/A 06/10/2015   Procedure: SAVORY DILATION;  Surgeon: Danie Binder, MD;  [facility];  Service: Endoscopy;  Laterality: N/A;  . TRANSTHORACIC ECHOCARDIOGRAM  01-01-2014   mild LVH/  ef 46-96%/  grade I diastolic dysfunction/  trivial MR, TR, and PR  . VAGINAL HYSTERECTOMY  1970's    There were no vitals filed for this visit.  Subjective Assessment - 09/10/17 1615    Subjective  Patient reported that she does not have any pain, but that she is tired. Patient stated performing exercises at home is slow.     Pertinent History  TIA 2017, DM, anemia, chronic kidney Dz, fatigut , depression, cancer     Currently in Pain?  No/denies                       [facility] Adult PT Treatment/Exercise - 09/10/17 0001      Ambulation/Gait   Ambulation Distance (Feet)  452 Feet 308 feet then 144 feet    Assistive device  None    Gait Pattern  Decreased step length - right;Decreased  step length - left    Ambulation Surface  Level    Gait velocity  0.34 m/s      Knee/Hip Exercises: Standing   Heel Raises  2 sets;10 reps Bilateral upper extremity support    Forward Lunges Limitations  10 repetitions on each lower extremity single upper extremity support    Forward Step Up  Both;10 reps;Hand Hold: 1;Step Height: 6"    Step Down  Both;10 reps;Hand Hold: 1;Step Height: 6"    Functional Squat  3 sets;5 sets      Knee/Hip Exercises: Seated   Long Arc Quad  Strengthening;Both;Weights;15 reps;2 sets    Long Arc Quad Weight  4 lbs. ankle weight    Sit to Sand  without UE support;10 reps          Balance Exercises - 09/10/17 1634      Balance Exercises: Standing   SLS  Eyes open;Foam/compliant surface;Intermittent upper extremity support;5 reps;Other (comment) Each lower extremity    SLS with Vectors  Solid surface;3 reps;Other (comment) 2 repetitions x 3 vectors each lower extremity 1 HHA         PT  Education - 09/10/17 1734    Education provided  Yes    Education Details  Patient was educated on purpose and technique of exercises throughout session.     Person(s) Educated  Patient    Methods  Explanation;Demonstration;Verbal cues;Tactile cues    Comprehension  Verbal cues required;Tactile cues required;Returned demonstration;Verbalized understanding       PT Short Term Goals - 08/29/17 0840      PT SHORT TERM GOAL #1   Title  Pt to be able to single leg stance with both Le for 15 seconds to reduce the risk of falling.     Time  3    Period  Weeks    Status  On-going      PT SHORT TERM GOAL #2   Title  Pt to be able to ambulate for six minute without resting to show progression in community ambulation.     Time  3    Period  Weeks    Status  On-going      PT SHORT TERM GOAL #3   Title  Pt to be able to complet five sit to stand in 20 seconds or less to demonstrate increased power of LE for funtional activity such as sit to stand, arising from a squatted position.     Time  3    Period  Weeks    Status  On-going        PT Long Term Goals - 08/29/17 0840      PT LONG TERM GOAL #1   Title  Pt to be able ot single leg stance on both LE for 25 seconds to reduce risk of falling     Time  6    Period  Weeks    Status  On-going      PT LONG TERM GOAL #2   Title  Pt to be abl eot ambulate for 15 minutes without resting to become a Hydrographic surveyor.     Time  6    Period  Weeks    Status  On-going      PT LONG TERM GOAL #3   Title  Pt to be able to complet 5 sit to stand in 15 seconds to demonstrate improve power of B LE to arise from lower level objects such as a car.    Time  6    Period  Weeks    Status  On-going      PT LONG TERM GOAL #4   Title  Pt B LE mm grade to increase by one level to allow pt to step  up onto a curb with confidence.     Time  6    Period  Weeks    Status  On-going            Plan - 09/10/17 1736    Clinical Impression  Statement  This session, patient continued to be very limited by fatigue. This session began with gait training. Therapist provided verbal cueing for patient to perform heel strike and for posture. Patient was able to ambulate 308 feet initially this session at 0.34 m/s. Then session progressed to patient performing functional lower extremity strengthening exercises. This session added single leg stance on foam and also single leg stance toward vectors which patient performed with single upper extremity support. Patient was provided therapeutic rest breaks throughout session. Patient reported leg soreness at end of session and therapist educated patient on DOMS. Patient would benefit from continued skilled physical therapy in order to continue progress toward functional goals.     Rehab Potential  Fair    PT Frequency  2x / week    PT Duration  6 weeks    PT Treatment/Interventions  Gait training;Functional mobility training;Therapeutic activities;Therapeutic exercise;Balance training;Neuromuscular re-education;Patient/family education    PT Next Visit Plan  Ambulate every session with goal of increasing ambulation distance by 50 ft. Continue to progress functional strength.  Check on HEP compliance.    PT Home Exercise Plan  eval:   heel raises, squats, sit to stand, SLR, bridge and  sitting ankle Dorsi/plantar flexion .         Patient will benefit from skilled therapeutic intervention in order to improve the following deficits and impairments:  Abnormal gait, Decreased activity tolerance, Decreased balance, Decreased mobility, Decreased strength, Difficulty walking, Impaired perceived functional ability, Obesity, Pain  Visit Diagnosis: Muscle weakness (generalized)  Difficulty in walking, not elsewhere classified  Repeated falls     Problem List Patient Active Problem List   Diagnosis Date Noted  . Multiple myeloma ([place]) 03/15/2017  . Urinary frequency 02/28/2017  . SIRS (systemic  inflammatory response syndrome) ([place]) 02/26/2017  . 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 Morbid obesity ([place]) 10/14/2016  . Vaginal dryness, menopausal 10/14/2016  . Neuropathic pain of both feet 04/11/2016  . TIA (transient ischemic attack) 02/26/2016  . Lactose intolerance in adult 02/01/2016  . Brain TIA 08/19/2015  . CKD (chronic kidney disease) stage 3, GFR 30-59 ml/min ([facility]) 07/12/2015  . Lipoma of arm 01/19/2015  . Anemia 10/14/2014  . Lumbar back pain with radiculopathy affecting left lower extremity 05/11/2014  . Nocturnal hypoxia 02/04/2014  . Generalized anxiety disorder 05/20/2013  . Anxiety and depression 08/13/2012  . Abnormal brain scan 02/07/2012  . Thoracic or lumbosacral neuritis or radiculitis, unspecified 12/22/2010  . Hypercalcemia 11/30/2010  . Colon adenomas 05/11/2009  . HEMORRHOIDS 03/15/2009  . GERD 03/15/2009  . FATTY LIVER DISEASE 03/15/2009  . RENAL CALCULUS 03/15/2009  . Sleep apnea 09/15/2008  . 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 Hyperlipidemia LDL goal <100 09/17/2007  . Hypertension goal BP (blood pressure) < 130/80 09/17/2007   Cheryl Abbott PT, DPT 5:45 PM, 09/10/17 [place], [place], [address] Phone: [phone]   Fax:  [phone]  Name: Cheryl Abbott MRN: [id number] Date of Birth: August 19, 1945

## 2017-09-10 NOTE — Progress Notes (Signed)
Clinical Summary Cheryl Abbott is a 72 y.o.female seen today for follow up of the following medical problems.   1. Chest pain/SOB - echo 02/2016 LVEF 60-65%, grade I diastoilc dysfunction - 12/2013 nuclear stress without ischemia, low risk.  07/2016 PFTs: mild restrictive disease  -breathing is improving with exercise, reconditioning.  - still with some chest pain at times. Midchest, can radiate under left breast. Lasts few seconds. Pain is worst laying left side.   2. History of TIA  On ASA/Clopidogrel per neurolgoy  Past Medical History:  Diagnosis Date  . Anemia   . Anxiety   . Anxiety disorder   . Arthritis   . Cancer ([place])   . 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 Chronic back pain   . Depression   . Fatty liver   . GERD (gastroesophageal reflux disease)   . Headache   . History of adenomatous polyp of colon    2008  . History of kidney stones   . Hypercalcemia   . Hyperlipidemia   . Hypertension   . Left ureteral stone   . Lipoma of arm 01/19/2015  . Lumbar disc disease with radiculopathy   . Nephrolithiasis   . Neuropathy, peripheral   . OSA treated with BiPAP    per study 2007  . Sciatica   . Sigmoid diverticulosis   . Type 2 diabetes mellitus (HCC)      Allergies  Allergen Reactions  . Ace Inhibitors Cough  . Keflex [Cephalexin] Diarrhea and Nausea And Vomiting  . Nitrofurantoin Diarrhea and Nausea And Vomiting  . Penicillins Hives and Other (See Comments)    Reaction:  Blisters on hands/feet  Has patient had a PCN reaction causing immediate rash, facial/tongue/throat swelling, SOB or lightheadedness with hypotension: No Has patient had a PCN reaction causing severe rash involving mucus membranes or skin necrosis: No Has patient had a PCN reaction that required hospitalization No Has patient had a PCN reaction occurring within the last 10 years: No If all of the above answers are "NO", then may proceed with Cephalosporin use.     Current  Outpatient Medications  Medication Sig Dispense Refill  . ALPRAZolam (XANAX) 0.5 MG tablet Take 1 tablet (0.5 mg total) by mouth 2 (two) times daily. 60 tablet 3  . amLODipine (NORVASC) 10 MG tablet Take 1 tablet (10 mg total) by mouth daily. 90 tablet 3  . aspirin EC 325 MG tablet Take 325 mg by mouth daily.    . Cholecalciferol (VITAMIN D3) 5000 units CAPS Take 1 capsule by mouth 2 (two) times daily.    . clopidogrel (PLAVIX) 75 MG tablet Take 75 mg by mouth daily.    . Cyanocobalamin (B-12 PO) Take 1 tablet by mouth daily.    . diphenhydramine-acetaminophen (TYLENOL PM) 25-500 MG TABS tablet Take 2 tablets by mouth at bedtime.    . docusate sodium (COLACE) 100 MG capsule Take 200 mg by mouth at bedtime.     . iron polysaccharides (NIFEREX) 150 MG capsule Take 150 mg by mouth daily.    Marland Kitchen lovastatin (MEVACOR) 40 MG tablet TAKE 2 TABLETS (80 MG TOTAL) BY MOUTH AT BEDTIME. 180 tablet 1  . OXYGEN Inhale into the lungs daily. Bedtime    . pantoprazole (PROTONIX) 40 MG tablet TAKE 1 TABLET (40 MG TOTAL) BY MOUTH 2 (TWO) TIMES DAILY. 180 tablet 1  . PARoxetine (PAXIL) 40 MG tablet Take 1 tablet (40 mg total) by mouth every morning. 90 tablet 1  .  TRADJENTA 5 MG TABS tablet TAKE 1 TABLET BY MOUTH EVERY DAY 90 tablet 1  . triamcinolone cream (KENALOG) 0.1 % Apply 1 application topically 3 (three) times daily. (Patient taking differently: Apply 1 application topically 3 (three) times daily as needed. ) 30 g 0   No current facility-administered medications for this visit.      Past Surgical History:  Procedure Laterality Date  . BIOPSY N/A 03/17/2013   Procedure: GASTRIC BIOPSIES;  Surgeon: Danie Binder, MD;  Location: [facility];  Service: Endoscopy;  Laterality: N/A;  . BIOPSY  06/10/2015   Procedure: BIOPSY;  Surgeon: Danie Binder, MD;  [facility];  Service: Endoscopy;;  gastric biopsy  . CARDIAC CATHETERIZATION  05-11-2003  dr Shelva Majestic ([facility])   Abnormal  cardiolite/   Normal coronary arteries and normal LVF,  ef  63%  . CARDIOVASCULAR STRESS TEST  01-01-2014   normal lexiscan cardiolite/  no ischemia/ infarct/  normal LV function and wall motion ,  ef 81%  . COLONOSCOPY  last one 10/02/2011   [facility] [place] [facility], IH, NEXT TCS APR 2018  . COLONOSCOPY WITH PROPOFOL N/A 02/26/2017   Procedure: COLONOSCOPY WITH PROPOFOL;  Surgeon: Danie Binder, MD;  [facility];  Service: Endoscopy;  Laterality: N/A;  11:00am  . CYSTO/   RIGHT URETEROSOCOPY LASER LITHOTRIPSY STONE EXTRACTION  10-13-2004  . CYSTO/  RIGHT RETROGRADE PYELOGRAM/  PLACMENT RIGHT URETERAL STENT  01-10-2010  . CYSTOSCOPY W/ URETERAL STENT PLACEMENT Right 08/19/2015   Procedure: CYSTOSCOPY WITH RETROGRADE PYELOGRAM/URETERAL STENT PLACEMENT;  Surgeon: Franchot Gallo, MD;  Location: [facility];  Service: Urology;  Laterality: Right;  . CYSTOSCOPY WITH RETROGRADE PYELOGRAM, URETEROSCOPY AND STENT PLACEMENT Left 10/21/2014   Procedure: CYSTOSCOPY WITH RETROGRADE PYELOGRAM, URETEROSCOPY AND STENT PLACEMENT;  Surgeon: Franchot Gallo, MD;  Location[facility];  Service: Urology;  Laterality: Left;  . CYSTOSCOPY WITH RETROGRADE PYELOGRAM, URETEROSCOPY AND STENT PLACEMENT Right 11/22/2015   Procedure: CYSTOSCOPY, RIGHT URETERAL STENT REMOVAL; RIGHT RETROGRADE PYELOGRAM, RIGHT URETEROSCOPY WITH BALLOON DILATION; RIGHT URETERAL STENT PLACEMENT;  Surgeon: Franchot Gallo, MD;  Location: [facility];  Service: Urology;  Laterality: Right;  . CYSTOSCOPY/RETROGRADE/URETEROSCOPY/STONE EXTRACTION WITH BASKET Bilateral 08/02/2015   Procedure: CYSTOSCOPY; BILATERAL RETROGRADE PYELOGRAMS; BILATERAL URETEROSCOPY, STONE EXTRACTION WITH BASKET;  Surgeon: Franchot Gallo, MD;  Location: [facility];  Service: Urology;  Laterality: Bilateral;  . CYSTOSCOPY/RETROGRADE/URETEROSCOPY/STONE EXTRACTION WITH BASKET Right 06/28/2015   Procedure: CYSTOSCOPY/RETROGRADE/URETEROSCOPY/STONE EXTRACTION WITH BASKET,  RIGHT URETERAL DOUBLE J STENT PLACEMENT;  Surgeon: Franchot Gallo, MD;  Location: [facility];  Service: Urology;  Laterality: Right;  . ESOPHAGOGASTRODUODENOSCOPY (EGD) WITH PROPOFOL N/A 03/17/2013   Procedure: ESOPHAGOGASTRODUODENOSCOPY (EGD) WITH PROPOFOL;  Surgeon: Danie Binder, MD;  Location: [facility];  Service: Endoscopy;  Laterality: N/A;  . ESOPHAGOGASTRODUODENOSCOPY (EGD) WITH PROPOFOL N/A 06/10/2015   Distal gastritis, distal esophageal stricture s/p dilation  . ESOPHAGOGASTRODUODENOSCOPY (EGD) WITH PROPOFOL N/A 02/26/2017   Procedure: ESOPHAGOGASTRODUODENOSCOPY (EGD) WITH PROPOFOL;  Surgeon: Danie Binder, MD;  [facility];  Service: Endoscopy;  Laterality: N/A;  . FLEXIBLE SIGMOIDOSCOPY  09/11/2011   [id number] Internal hemorrhoids  . HOLMIUM LASER APPLICATION Left 6/97/9480   Procedure: HOLMIUM LASER APPLICATION;  Surgeon: Franchot Gallo, MD;  [facility];  Service: Urology;  Laterality: Left;  . HOLMIUM LASER APPLICATION Right 1/65/5374   Procedure: HOLMIUM LASER APPLICATION;  Surgeon: Franchot Gallo, MD;  Location: [facility];  Service: Urology;  Laterality: Right;  . MASS EXCISION Left  03/04/2015   Procedure: EXCISION OF SOFT TISSUE NEOPLASM LEFT ARM;  Surgeon: Aviva Signs, MD;  Location: [facility];  Service: General;  Laterality: Left;  . PERCUTANEOUS NEPHROSTOLITHOTOMY Bilateral 12/  2011     [facility]  . POLYPECTOMY N/A 03/17/2013   Procedure: GASTRIC POLYPECTOMY;  Surgeon: Danie Binder, MD;  Location: [facility];  Service: Endoscopy;  Laterality: N/A;  . POLYPECTOMY  02/26/2017   Procedure: POLYPECTOMY;  Surgeon: Danie Binder, MD;  [facility];  Service: Endoscopy;;  polyp at cecum, ascending colon polyps x3, hepatic flexure polyps x8, transverse colon polyps x8   . REMOVAL RIGHT THIGH CYST  2006  . SAVORY DILATION N/A 03/17/2013   Procedure: SAVORY DILATION;  Surgeon: Danie Binder, MD;  Location: [facility];  Service: Endoscopy;   Laterality: N/A;  #12.8, 14, 15, 16 dilators used  . SAVORY DILATION N/A 06/10/2015   Procedure: SAVORY DILATION;  Surgeon: Danie Binder, MD;  [facility];  Service: Endoscopy;  Laterality: N/A;  . TRANSTHORACIC ECHOCARDIOGRAM  01-01-2014   mild LVH/  ef 09-73%/  grade I diastolic dysfunction/  trivial MR, TR, and PR  . VAGINAL HYSTERECTOMY  1970's     Allergies  Allergen Reactions  . Ace Inhibitors Cough  . Keflex [Cephalexin] Diarrhea and Nausea And Vomiting  . Nitrofurantoin Diarrhea and Nausea And Vomiting  . Penicillins Hives and Other (See Comments)    Reaction:  Blisters on hands/feet  Has patient had a PCN reaction causing immediate rash, facial/tongue/throat swelling, SOB or lightheadedness with hypotension: No Has patient had a PCN reaction causing severe rash involving mucus membranes or skin necrosis: No Has patient had a PCN reaction that required hospitalization No Has patient had a PCN reaction occurring within the last 10 years: No If all of the above answers are "NO", then may proceed with Cephalosporin use.      Family History  Problem Relation Age of Onset  . Hypertension Mother   . Diabetes Mother   . Heart failure Mother   . Dementia Mother   . Emphysema Father   . Hypertension Father   . Diabetes Brother   . GER disease Brother   . Hypertension Sister   . Hypertension Sister   . Cancer Unknown        family history   . Diabetes Unknown        family history   . Heart defect Unknown        famiily history   . Arthritis Unknown        family history   . Anesthesia problems Neg Hx   . Hypotension Neg Hx   . Malignant hyperthermia Neg Hx   . Pseudochol deficiency Neg Hx   . Colon cancer Neg Hx      Social History Cheryl Abbott reports that she quit smoking about 28 years ago. Her smoking use included cigarettes. She started smoking about 43 years ago. She has a 20.00 pack-year smoking history. She has never used smokeless  tobacco. Cheryl Abbott reports that she does not drink alcohol.   Review of Systems CONSTITUTIONAL: No weight loss, fever, chills, weakness or fatigue.  HEENT: Eyes: No visual loss, blurred vision, double vision or yellow sclerae.No hearing loss, sneezing, congestion, runny nose or sore throat.  SKIN: No rash or itching.  CARDIOVASCULAR: per hpi RESPIRATORY: No shortness of breath, cough or sputum.  GASTROINTESTINAL: No anorexia, nausea, vomiting or diarrhea. No abdominal pain or blood.  GENITOURINARY:  No burning on urination, no polyuria NEUROLOGICAL: No headache, dizziness, syncope, paralysis, ataxia, numbness or tingling in the extremities. No change in bowel or bladder control.  MUSCULOSKELETAL: No muscle, back pain, joint pain or stiffness.  LYMPHATICS: No enlarged nodes. No history of splenectomy.  PSYCHIATRIC: No history of depression or anxiety.  ENDOCRINOLOGIC: No reports of sweating, cold or heat intolerance. No polyuria or polydipsia.  Marland Kitchen   Physical Examination Vitals:   09/10/17 1321  BP: 122/70  Pulse: 89  SpO2: 94%   Vitals:   09/10/17 1321  Weight: 209 lb (94.8 kg)  Height: 5\' 6"  (1.676 m)    Gen: resting comfortably, no acute distress HEENT: no scleral icterus, pupils equal round and reactive, no palptable cervical adenopathy,  CV: RRR, no m/r/g, no jvd Resp: Clear to auscultation bilaterally GI: abdomen is soft, non-tender, non-distended, normal bowel sounds, no hepatosplenomegaly MSK: extremities are warm, no edema.  Skin: warm, no rash Neuro:  no focal deficits Psych: appropriate affect     Assessment and Plan   1. SOB/Chest pain. -Unremarkable cardiac and pulmonary workup  - symptoms have been improving with increased exercise, reconditioning.  - continue to monitor at this time.    2. Hypokalemia - restart her KCl 69mEq daily, repeat labs BMET/Mg in 2 weeks  3. HTN - at goal continue current meds    F/u 1 year      Arnoldo Lenis, M.D.

## 2017-09-10 NOTE — Patient Instructions (Signed)
Your physician wants you to follow-up in: [place] will receive a reminder letter in the mail two months in advance. If you don't receive a letter, please call our office to schedule the follow-up appointment.  Your physician has recommended you make the following change in your medication:   START POTASSIUM [address] [place]  Your physician recommends that you return for lab work in: 2 WEEKS BMP/MG  Thank you for choosing [facility]!!

## 2017-09-12 ENCOUNTER — Ambulatory Visit (HOSPITAL_COMMUNITY): Payer: Medicare Other

## 2017-09-12 ENCOUNTER — Encounter (HOSPITAL_COMMUNITY): Payer: Self-pay

## 2017-09-12 DIAGNOSIS — R296 Repeated falls: Secondary | ICD-10-CM

## 2017-09-12 DIAGNOSIS — M6281 Muscle weakness (generalized): Secondary | ICD-10-CM

## 2017-09-12 DIAGNOSIS — R262 Difficulty in walking, not elsewhere classified: Secondary | ICD-10-CM

## 2017-09-12 DIAGNOSIS — R29898 Other symptoms and signs involving the musculoskeletal system: Secondary | ICD-10-CM | POA: Diagnosis not present

## 2017-09-12 NOTE — Therapy (Signed)
[facility], [place], [address] Phone: [phone]   Fax:  [phone]  Physical Therapy Treatment  Patient Details  Name: Cheryl Abbott MRN: [id number] Date of Birth: 1945-08-04 Referring Provider: Faythe Casa   Encounter Date: 09/12/2017  PT End of Session - 09/12/17 1702    Visit Number  6    Number of Visits  12    Date for PT Re-Evaluation  10/09/17 minireassess 09/18/17    Authorization Type  medicare    Authorization Time Period  03/20-->10/09/17    Authorization - Visit Number  6    Authorization - Number of Visits  12    PT Start Time  1761    PT Stop Time  1650    PT Time Calculation (min)  42 min    Activity Tolerance  Patient tolerated treatment well;Patient limited by pain;Patient limited by lethargy Limited by swollen ankle and stomach, fatigue    Behavior During Therapy  [facility] for tasks assessed/performed       Past Medical History:  Diagnosis Date  . Anemia   . Anxiety   . Anxiety disorder   . Arthritis   . Cancer ([place])   . 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 Chronic back pain   . Depression   . Fatty liver   . GERD (gastroesophageal reflux disease)   . Headache   . History of adenomatous polyp of colon    2008  . History of kidney stones   . Hypercalcemia   . Hyperlipidemia   . Hypertension   . Left ureteral stone   . Lipoma of arm 01/19/2015  . Lumbar disc disease with radiculopathy   . Nephrolithiasis   . Neuropathy, peripheral   . OSA treated with BiPAP    per study 2007  . Sciatica   . Sigmoid diverticulosis   . Type 2 diabetes mellitus ([place])     Past Surgical History:  Procedure Laterality Date  . BIOPSY N/A 03/17/2013   Procedure: GASTRIC BIOPSIES;  Surgeon: Danie Binder, MD;  [facility]: [facility];  Service: Endoscopy;  Laterality: N/A;  . BIOPSY  06/10/2015   Procedure: BIOPSY;  Surgeon: Danie Binder, MD;  [facility];  Service: Endoscopy;;   gastric biopsy  . CARDIAC CATHETERIZATION  05-11-2003  dr Shelva Majestic ([facility])   Abnormal cardiolite/   Normal coronary arteries and normal LVF,  ef  63%  . CARDIOVASCULAR STRESS TEST  01-01-2014   normal lexiscan cardiolite/  no ischemia/ infarct/  normal LV function and wall motion ,  ef 81%  . COLONOSCOPY  last one 10/02/2011   MOD [place] TICS, IH, NEXT TCS APR 2018  . COLONOSCOPY WITH PROPOFOL N/A 02/26/2017   Procedure: COLONOSCOPY WITH PROPOFOL;  Surgeon: Danie Binder, MD;  [facility];  Service: Endoscopy;  Laterality: N/A;  11:00am  . CYSTO/   RIGHT URETEROSOCOPY LASER LITHOTRIPSY STONE EXTRACTION  10-13-2004  . CYSTO/  RIGHT RETROGRADE PYELOGRAM/  PLACMENT RIGHT URETERAL STENT  01-10-2010  . CYSTOSCOPY W/ URETERAL STENT PLACEMENT Right 08/19/2015   Procedure: CYSTOSCOPY WITH RETROGRADE PYELOGRAM/URETERAL STENT PLACEMENT;  Surgeon: Franchot Gallo, MD;  Location: [facility];  Service: Urology;  Laterality: Right;  . CYSTOSCOPY WITH RETROGRADE PYELOGRAM, URETEROSCOPY AND STENT PLACEMENT Left 10/21/2014   Procedure: CYSTOSCOPY WITH RETROGRADE PYELOGRAM, URETEROSCOPY AND STENT PLACEMENT;  Surgeon: Franchot Gallo, MD;  Location: [facility];  Service: Urology;  Laterality: Left;  .  CYSTOSCOPY WITH RETROGRADE PYELOGRAM, URETEROSCOPY AND STENT PLACEMENT Right 11/22/2015   Procedure: CYSTOSCOPY, RIGHT URETERAL STENT REMOVAL; RIGHT RETROGRADE PYELOGRAM, RIGHT URETEROSCOPY WITH BALLOON DILATION; RIGHT URETERAL STENT PLACEMENT;  Surgeon: Franchot Gallo, MD;  [facility]: [facility];  Service: Urology;  Laterality: Right;  . CYSTOSCOPY/RETROGRADE/URETEROSCOPY/STONE EXTRACTION WITH BASKET Bilateral 08/02/2015   Procedure: CYSTOSCOPY; BILATERAL RETROGRADE PYELOGRAMS; BILATERAL URETEROSCOPY, STONE EXTRACTION WITH BASKET;  Surgeon: Franchot Gallo, MD;  [facility];  Service: Urology;  Laterality: Bilateral;  . CYSTOSCOPY/RETROGRADE/URETEROSCOPY/STONE EXTRACTION  WITH BASKET Right 06/28/2015   Procedure: CYSTOSCOPY/RETROGRADE/URETEROSCOPY/STONE EXTRACTION WITH BASKET, RIGHT URETERAL DOUBLE J STENT PLACEMENT;  Surgeon: Franchot Gallo, MD;  [facility];  Service: Urology;  Laterality: Right;  . ESOPHAGOGASTRODUODENOSCOPY (EGD) WITH PROPOFOL N/A 03/17/2013   Procedure: ESOPHAGOGASTRODUODENOSCOPY (EGD) WITH PROPOFOL;  Surgeon: Danie Binder, MD;  [facility];  Service: Endoscopy;  Laterality: N/A;  . ESOPHAGOGASTRODUODENOSCOPY (EGD) WITH PROPOFOL N/A 06/10/2015   Distal gastritis, distal esophageal stricture s/p dilation  . ESOPHAGOGASTRODUODENOSCOPY (EGD) WITH PROPOFOL N/A 02/26/2017   Procedure: ESOPHAGOGASTRODUODENOSCOPY (EGD) WITH PROPOFOL;  Surgeon: Danie Binder, MD;  [facility];  Service: Endoscopy;  Laterality: N/A;  . FLEXIBLE SIGMOIDOSCOPY  09/11/2011   [id number] Internal hemorrhoids  . HOLMIUM LASER APPLICATION Left 02/03/36   Procedure: HOLMIUM LASER APPLICATION;  Surgeon: Franchot Gallo, MD;  Location: [facility];  Service: Urology;  Laterality: Left;  . HOLMIUM LASER APPLICATION Right 0/48/8891   Procedure: HOLMIUM LASER APPLICATION;  Surgeon: Franchot Gallo, MD;  [facility]: [facility];  Service: Urology;  Laterality: Right;  . MASS EXCISION Left 03/04/2015   Procedure: EXCISION OF SOFT TISSUE NEOPLASM LEFT ARM;  Surgeon: Aviva Signs, MD;  Location: [facility];  Service: General;  Laterality: Left;  . PERCUTANEOUS NEPHROSTOLITHOTOMY Bilateral 12/  2011     [facility]  . POLYPECTOMY N/A 03/17/2013   Procedure: GASTRIC POLYPECTOMY;  Surgeon: Danie Binder, MD;  [facility]: [facility];  Service: Endoscopy;  Laterality: N/A;  . POLYPECTOMY  02/26/2017   Procedure: POLYPECTOMY;  Surgeon: Danie Binder, MD;  [facility];  Service: Endoscopy;;  polyp at cecum, ascending colon polyps x3, hepatic flexure polyps x8, transverse colon polyps x8   . REMOVAL RIGHT THIGH CYST  2006  . SAVORY DILATION N/A  03/17/2013   Procedure: SAVORY DILATION;  Surgeon: Danie Binder, MD;  [facility]: [facility];  Service: Endoscopy;  Laterality: N/A;  #12.8, 14, 15, 16 dilators used  . SAVORY DILATION N/A 06/10/2015   Procedure: SAVORY DILATION;  Surgeon: Danie Binder, MD;  [facility];  Service: Endoscopy;  Laterality: N/A;  . TRANSTHORACIC ECHOCARDIOGRAM  01-01-2014   mild LVH/  ef 69-45%/  grade I diastolic dysfunction/  trivial MR, TR, and PR  . VAGINAL HYSTERECTOMY  1970's    There were no vitals filed for this visit.  Subjective Assessment - 09/12/17 1618    Subjective  Pt stated she has a swollen stomach and Bil ankles today.  Reports she is moving slowly wiht HEP, mainly only walking down hallway at home.    Pertinent History  TIA 2017, DM, anemia, chronic kidney Dz, fatigut , depression, cancer     Currently in Pain?  Yes    Pain Score  5     Pain Location  Generalized stomach and swollen ankles    Pain Type  Chronic pain    Aggravating Factors   weight bearing    Pain Relieving Factors  elevating LE  [facility] PT Assessment - 09/12/17 0001      Assessment   Medical Diagnosis  Weakness and fatigue    Referring Provider  Faythe Casa    Onset Date/Surgical Date  07/12/17    Next MD Visit  June, 2019    Prior Therapy  last year       Precautions   Precautions  Fall                   [facility] Adult PT Treatment/Exercise - 09/12/17 0001      Ambulation/Gait   Ambulation Distance (Feet)  162 Feet    Assistive device  None    Gait Pattern  Decreased step length - right;Decreased step length - left    Ambulation Surface  Level      Knee/Hip Exercises: Standing   Heel Raises  2 sets;10 reps    Forward Step Up  Both;10 reps;Hand Hold: 1;Step Height: 6"    Step Down  Both;10 reps;Hand Hold: 1;Step Height: 6"    Functional Squat  10 reps    SLS  x5 B       Knee/Hip Exercises: Seated   Long Arc Quad  Strengthening;Both;Weights;15 reps;2 sets    Long Arc Quad  Weight  5 lbs.    Sit to Sand  without UE support;10 reps               PT Short Term Goals - 08/29/17 0840      PT SHORT TERM GOAL #1   Title  Pt to be able to single leg stance with both Le for 15 seconds to reduce the risk of falling.     Time  3    Period  Weeks    Status  On-going      PT SHORT TERM GOAL #2   Title  Pt to be able to ambulate for six minute without resting to show progression in community ambulation.     Time  3    Period  Weeks    Status  On-going      PT SHORT TERM GOAL #3   Title  Pt to be able to complet five sit to stand in 20 seconds or less to demonstrate increased power of LE for funtional activity such as sit to stand, arising from a squatted position.     Time  3    Period  Weeks    Status  On-going        PT Long Term Goals - 08/29/17 0840      PT LONG TERM GOAL #1   Title  Pt to be able ot single leg stance on both LE for 25 seconds to reduce risk of falling     Time  6    Period  Weeks    Status  On-going      PT LONG TERM GOAL #2   Title  Pt to be abl eot ambulate for 15 minutes without resting to become a Hydrographic surveyor.     Time  6    Period  Weeks    Status  On-going      PT LONG TERM GOAL #3   Title  Pt to be able to complet 5 sit to stand in 15 seconds to demonstrate improve power of B LE to arise from lower level objects such as a car.    Time  6    Period  Weeks    Status  On-going  PT LONG TERM GOAL #4   Title  Pt B LE mm grade to increase by one level to allow pt to step  up onto a curb with confidence.     Time  6    Period  Weeks    Status  On-going            Plan - 09/12/17 1628    Clinical Impression Statement  Pt with increased fatigue this session, reduced distance prior request of seated rest breaks with gait training.  Pt c/o increased fatigue and swelling in stomach and ankle with reports of new potassium medication.  Pt encouraged to contact MD with change.  Session focus with  funcitonal strengthening.  Pt required seated rest breaks between each exercise.  Reviewed importance of compliance iwht HEP, additional copies given to assure which exercises expected to complete at home and benefits of walking program.  No reports of increased pain just fatigue.      Rehab Potential  Fair    PT Frequency  2x / week    PT Duration  6 weeks    PT Treatment/Interventions  Gait training;Functional mobility training;Therapeutic activities;Therapeutic exercise;Balance training;Neuromuscular re-education;Patient/family education    PT Next Visit Plan  Ambulate every session with goal of increasing ambulation distance by 50 ft. Continue to progress functional strength.  Check on HEP compliance.    PT Home Exercise Plan  eval:   heel raises, squats, sit to stand, SLR, bridge and  sitting ankle Dorsi/plantar flexion .         Patient will benefit from skilled therapeutic intervention in order to improve the following deficits and impairments:  Abnormal gait, Decreased activity tolerance, Decreased balance, Decreased mobility, Decreased strength, Difficulty walking, Impaired perceived functional ability, Obesity, Pain  Visit Diagnosis: Muscle weakness (generalized)  Difficulty in walking, not elsewhere classified  Repeated falls     Problem List Patient Active Problem List   Diagnosis Date Noted  . Multiple myeloma ([place]) 03/15/2017  . Urinary frequency 02/28/2017  . SIRS (systemic inflammatory response syndrome) ([place]) 02/26/2017  . 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 Morbid obesity ([place]) 10/14/2016  . Vaginal dryness, menopausal 10/14/2016  . Neuropathic pain of both feet 04/11/2016  . TIA (transient ischemic attack) 02/26/2016  . Lactose intolerance in adult 02/01/2016  . Brain TIA 08/19/2015  . CKD (chronic kidney disease) stage 3, GFR 30-59 ml/min ([facility]) 07/12/2015  . Lipoma of arm 01/19/2015  . Anemia 10/14/2014  . Lumbar back pain with radiculopathy affecting  left lower extremity 05/11/2014  . Nocturnal hypoxia 02/04/2014  . Generalized anxiety disorder 05/20/2013  . Anxiety and depression 08/13/2012  . Abnormal brain scan 02/07/2012  . Thoracic or lumbosacral neuritis or radiculitis, unspecified 12/22/2010  . Hypercalcemia 11/30/2010  . Colon adenomas 05/11/2009  . HEMORRHOIDS 03/15/2009  . GERD 03/15/2009  . FATTY LIVER DISEASE 03/15/2009  . RENAL CALCULUS 03/15/2009  . Sleep apnea 09/15/2008  . 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 Hyperlipidemia LDL goal <100 09/17/2007  . Hypertension goal BP (blood pressure) < 130/80 09/17/2007   Ihor Austin, LPTA; CBIS [phone]  [name] 09/12/2017, 5:07 PM  [place], [place], [address] Phone: [phone]   Fax:  [phone]  Name: Cheryl Abbott MRN: [id number] Date of Birth: 1946/01/14

## 2017-09-13 ENCOUNTER — Other Ambulatory Visit: Payer: Self-pay | Admitting: Cardiology

## 2017-09-13 DIAGNOSIS — I10 Essential (primary) hypertension: Secondary | ICD-10-CM | POA: Diagnosis not present

## 2017-09-14 LAB — SPECIMEN STATUS REPORT

## 2017-09-14 LAB — BASIC METABOLIC PANEL
BUN/Creatinine Ratio: 7 — ABNORMAL LOW (ref 12–28)
BUN: 11 mg/dL (ref 8–27)
CO2: 24 mmol/L (ref 20–29)
Calcium: 10 mg/dL (ref 8.7–10.3)
Chloride: 99 mmol/L (ref 96–106)
Creatinine, Ser: 1.47 mg/dL — ABNORMAL HIGH (ref 0.57–1.00)
GFR calc Af Amer: 41 mL/min/{1.73_m2} — ABNORMAL LOW (ref >59)
GFR calc non Af Amer: 35 mL/min/{1.73_m2} — ABNORMAL LOW (ref >59)
Glucose: 283 mg/dL — ABNORMAL HIGH (ref 65–99)
Potassium: 4.2 mmol/L (ref 3.5–5.2)
Sodium: 136 mmol/L (ref 134–144)

## 2017-09-14 LAB — MAGNESIUM: Magnesium: 1.9 mg/dL (ref 1.6–2.3)

## 2017-09-15 ENCOUNTER — Encounter: Payer: Self-pay | Admitting: Cardiology

## 2017-09-16 ENCOUNTER — Encounter (HOSPITAL_COMMUNITY): Payer: Self-pay | Admitting: Emergency Medicine

## 2017-09-16 ENCOUNTER — Other Ambulatory Visit: Payer: Self-pay

## 2017-09-16 ENCOUNTER — Emergency Department (HOSPITAL_COMMUNITY)
Admission: EM | Admit: 2017-09-16 | Discharge: 2017-09-16 | Disposition: A | Discharge status: Home / Self Care | Payer: Medicare Other | Attending: Emergency Medicine | Admitting: Emergency Medicine

## 2017-09-16 DIAGNOSIS — Z5321 Procedure and treatment not carried out due to patient leaving prior to being seen by health care provider: Secondary | ICD-10-CM | POA: Diagnosis not present

## 2017-09-16 DIAGNOSIS — M542 Cervicalgia: Secondary | ICD-10-CM | POA: Insufficient documentation

## 2017-09-16 NOTE — ED Notes (Signed)
Pt states she is leaving and can't wait because it's getting dark outside and she doesn't drive. Encouraged pt to wait to be seen. Pt states she will just go home and if still feeling bad will call EMS to bring her back up here.

## 2017-09-16 NOTE — ED Notes (Signed)
Pt advised registration she is leaving.

## 2017-09-16 NOTE — ED Triage Notes (Signed)
Pt states having left side neck pain going into arm since yesterday. Pt denies injury, pt also complaining of abd swelling for 5 months.

## 2017-09-17 ENCOUNTER — Ambulatory Visit (HOSPITAL_COMMUNITY): Payer: Medicare Other | Admitting: Physical Therapy

## 2017-09-17 ENCOUNTER — Telehealth (HOSPITAL_COMMUNITY): Payer: Self-pay | Admitting: Physical Therapy

## 2017-09-17 DIAGNOSIS — R0902 Hypoxemia: Secondary | ICD-10-CM | POA: Diagnosis not present

## 2017-09-17 NOTE — Telephone Encounter (Signed)
Pt called re no show for today's visit.  No answer but therapist left message that pt next visit is on Thursday at 1300.   Rayetta Humphrey, [place] CLT [phone]

## 2017-09-19 ENCOUNTER — Other Ambulatory Visit: Payer: Self-pay

## 2017-09-19 ENCOUNTER — Telehealth: Payer: Self-pay

## 2017-09-19 ENCOUNTER — Encounter (HOSPITAL_COMMUNITY): Payer: Self-pay

## 2017-09-19 ENCOUNTER — Ambulatory Visit (HOSPITAL_COMMUNITY): Payer: Medicare Other

## 2017-09-19 DIAGNOSIS — R296 Repeated falls: Secondary | ICD-10-CM | POA: Diagnosis not present

## 2017-09-19 DIAGNOSIS — R29898 Other symptoms and signs involving the musculoskeletal system: Secondary | ICD-10-CM | POA: Diagnosis not present

## 2017-09-19 DIAGNOSIS — R262 Difficulty in walking, not elsewhere classified: Secondary | ICD-10-CM | POA: Diagnosis not present

## 2017-09-19 DIAGNOSIS — M6281 Muscle weakness (generalized): Secondary | ICD-10-CM | POA: Diagnosis not present

## 2017-09-19 NOTE — Therapy (Signed)
[place], [place], [address] Phone: [phone]   Fax:  [phone]  Occupational Therapy Treatment  Patient Details  Name: Cheryl Abbott MRN: [id number] Date of Birth: Oct 31, 1945 Referring Provider: Faythe Casa   Encounter Date: 09/19/2017  OT End of Session - 09/19/17 1450    Visit Number  2    Number of Visits  5    Date for OT Re-Evaluation  10/03/17    Authorization Type  [employer] Medicare    OT Start Time  1308    OT Stop Time  1347    OT Time Calculation (min)  39 min    Activity Tolerance  Patient tolerated treatment well    Behavior During Therapy  [facility] for tasks assessed/performed       Past Medical History:  Diagnosis Date  . Anemia   . Anxiety   . Anxiety disorder   . Arthritis   . Cancer ([place])   . 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 Chronic back pain   . Depression   . Fatty liver   . GERD (gastroesophageal reflux disease)   . Headache   . History of adenomatous polyp of colon    2008  . History of kidney stones   . Hypercalcemia   . Hyperlipidemia   . Hypertension   . Left ureteral stone   . Lipoma of arm 01/19/2015  . Lumbar disc disease with radiculopathy   . Nephrolithiasis   . Neuropathy, peripheral   . OSA treated with BiPAP    per study 2007  . Sciatica   . Sigmoid diverticulosis   . Type 2 diabetes mellitus ([place])     Past Surgical History:  Procedure Laterality Date  . BIOPSY N/A 03/17/2013   Procedure: GASTRIC BIOPSIES;  Surgeon: Danie Binder, MD;  Location: [facility];  Service: Endoscopy;  Laterality: N/A;  . BIOPSY  06/10/2015   Procedure: BIOPSY;  Surgeon: Danie Binder, MD;  [facility];  Service: Endoscopy;;  gastric biopsy  . CARDIAC CATHETERIZATION  05-11-2003  dr Shelva Majestic ([facility])   Abnormal cardiolite/   Normal coronary arteries and normal LVF,  ef  63%  . CARDIOVASCULAR STRESS TEST  01-01-2014   normal lexiscan  cardiolite/  no ischemia/ infarct/  normal LV function and wall motion ,  ef 81%  . COLONOSCOPY  last one 10/02/2011   MOD [place] TICS, IH, NEXT TCS APR 2018  . COLONOSCOPY WITH PROPOFOL N/A 02/26/2017   Procedure: COLONOSCOPY WITH PROPOFOL;  Surgeon: Danie Binder, MD;  [facility];  Service: Endoscopy;  Laterality: N/A;  11:00am  . CYSTO/   RIGHT URETEROSOCOPY LASER LITHOTRIPSY STONE EXTRACTION  10-13-2004  . CYSTO/  RIGHT RETROGRADE PYELOGRAM/  PLACMENT RIGHT URETERAL STENT  01-10-2010  . CYSTOSCOPY W/ URETERAL STENT PLACEMENT Right 08/19/2015   Procedure: CYSTOSCOPY WITH RETROGRADE PYELOGRAM/URETERAL STENT PLACEMENT;  Surgeon: Franchot Gallo, MD;  Location: [facility];  Service: Urology;  Laterality: Right;  . CYSTOSCOPY WITH RETROGRADE PYELOGRAM, URETEROSCOPY AND STENT PLACEMENT Left 10/21/2014   Procedure: CYSTOSCOPY WITH RETROGRADE PYELOGRAM, URETEROSCOPY AND STENT PLACEMENT;  Surgeon: Franchot Gallo, MD;  Location: [facility];  Service: Urology;  Laterality: Left;  . CYSTOSCOPY WITH RETROGRADE PYELOGRAM, URETEROSCOPY AND STENT PLACEMENT Right 11/22/2015   Procedure: CYSTOSCOPY, RIGHT URETERAL STENT REMOVAL; RIGHT RETROGRADE PYELOGRAM, RIGHT URETEROSCOPY WITH BALLOON DILATION; RIGHT URETERAL STENT PLACEMENT;  Surgeon: Franchot Gallo, MD;  Location: [facility];  Service: Urology;  Laterality: Right;  . CYSTOSCOPY/RETROGRADE/URETEROSCOPY/STONE EXTRACTION WITH BASKET Bilateral 08/02/2015   Procedure: CYSTOSCOPY; BILATERAL RETROGRADE PYELOGRAMS; BILATERAL URETEROSCOPY, STONE EXTRACTION WITH BASKET;  Surgeon: Franchot Gallo, MD;  Location: [facility];  Service: Urology;  Laterality: Bilateral;  . CYSTOSCOPY/RETROGRADE/URETEROSCOPY/STONE EXTRACTION WITH BASKET Right 06/28/2015   Procedure: CYSTOSCOPY/RETROGRADE/URETEROSCOPY/STONE EXTRACTION WITH BASKET, RIGHT URETERAL DOUBLE J STENT PLACEMENT;  Surgeon: Franchot Gallo, MD;  Location: [facility];  Service: Urology;  Laterality:  Right;  . ESOPHAGOGASTRODUODENOSCOPY (EGD) WITH PROPOFOL N/A 03/17/2013   Procedure: ESOPHAGOGASTRODUODENOSCOPY (EGD) WITH PROPOFOL;  Surgeon: Danie Binder, MD;  [facility]: [facility];  Service: Endoscopy;  Laterality: N/A;  . ESOPHAGOGASTRODUODENOSCOPY (EGD) WITH PROPOFOL N/A 06/10/2015   Distal gastritis, distal esophageal stricture s/p dilation  . ESOPHAGOGASTRODUODENOSCOPY (EGD) WITH PROPOFOL N/A 02/26/2017   Procedure: ESOPHAGOGASTRODUODENOSCOPY (EGD) WITH PROPOFOL;  Surgeon: Danie Binder, MD;  [facility];  Service: Endoscopy;  Laterality: N/A;  . FLEXIBLE SIGMOIDOSCOPY  09/11/2011   [id number] Internal hemorrhoids  . HOLMIUM LASER APPLICATION Left 9/81/1914   Procedure: HOLMIUM LASER APPLICATION;  Surgeon: Franchot Gallo, MD;  Location: [facility];  Service: Urology;  Laterality: Left;  . HOLMIUM LASER APPLICATION Right 7/82/9562   Procedure: HOLMIUM LASER APPLICATION;  Surgeon: Franchot Gallo, MD;  Location: [facility];  Service: Urology;  Laterality: Right;  . MASS EXCISION Left 03/04/2015   Procedure: EXCISION OF SOFT TISSUE NEOPLASM LEFT ARM;  Surgeon: Aviva Signs, MD;  Location: [facility];  Service: General;  Laterality: Left;  . PERCUTANEOUS NEPHROSTOLITHOTOMY Bilateral 12/  2011     [facility]  . POLYPECTOMY N/A 03/17/2013   Procedure: GASTRIC POLYPECTOMY;  Surgeon: Danie Binder, MD;  Location: [facility];  Service: Endoscopy;  Laterality: N/A;  . POLYPECTOMY  02/26/2017   Procedure: POLYPECTOMY;  Surgeon: Danie Binder, MD;  [facility];  Service: Endoscopy;;  polyp at cecum, ascending colon polyps x3, hepatic flexure polyps x8, transverse colon polyps x8   . REMOVAL RIGHT THIGH CYST  2006  . SAVORY DILATION N/A 03/17/2013   Procedure: SAVORY DILATION;  Surgeon: Danie Binder, MD;  Location: [facility];  Service: Endoscopy;  Laterality: N/A;  #12.8, 14, 15, 16 dilators used  . SAVORY DILATION N/A 06/10/2015   Procedure: SAVORY DILATION;  Surgeon:  Danie Binder, MD;  [facility];  Service: Endoscopy;  Laterality: N/A;  . TRANSTHORACIC ECHOCARDIOGRAM  01-01-2014   mild LVH/  ef 13-08%/  grade I diastolic dysfunction/  trivial MR, TR, and PR  . VAGINAL HYSTERECTOMY  1970's    There were no vitals filed for this visit.  Subjective Assessment - 09/19/17 1443    Subjective   S: I just am so tired.     Currently in Pain?  Yes    Pain Score  5     Pain Location  Shoulder low back    Pain Orientation  Left    Pain Descriptors / Indicators  Aching    Pain Type  Acute pain    Pain Frequency  Constant    Aggravating Factors   unknown    Pain Relieving Factors  unknown    Multiple Pain Sites  No                   OT Treatments/Exercises (OP) - 09/19/17 1449      Cognitive Exercises   Other Cognitive Exercises 1  Discussed memory management techniques including following a routine, using memory aides, increasing activity, and  orgainzing her home environment. Discussed examples of each technique. Patient verbalized understanding as well as provided any techniques that sounded helpful.              OT Education - 09/19/17 1455    Education provided  Yes    Education Details  Management of Memory problems. Pt was provided with OT evaluation print out. Reviewed plan of care and goals.    Person(s) Educated  Patient    Methods  Explanation;Handout    Comprehension  Verbalized understanding       OT Short Term Goals - 09/19/17 1459      OT SHORT TERM GOAL #1   Title  Pt will be provided with and educated on HEP to improve BUE strength and activity tolerance required for ADL completion.     Time  4    Period  Weeks    Status  On-going      OT SHORT TERM GOAL #2   Title  Pt will be educated on and actively implement energy conservation strategies to improve activity tolerance and independence in ADL completion.     Time  4    Period  Weeks    Status  On-going      OT SHORT TERM GOAL #3   Title   Pt will improve BUE strength to 4+/5 to increase ability to perform meal preparation tasks including carrying groceries and lifting pots and pans.     Time  4    Period  Weeks    Status  On-going      OT SHORT TERM GOAL #4   Title  Pt will be educated on compensatory strategies and techniques for ADL completion to improve independence and daily activity tolerance.     Time  4    Period  Weeks    Status  On-going      OT SHORT TERM GOAL #5   Title  Pt will be educated on cognitive strategies to compensate for memory issues.     Time  4    Period  Weeks    Status  On-going               Plan - 09/19/17 1455    Clinical Impression Statement  A: Session was focused on education regarding therapy goals and plan of care. Education provided for memory strategy techniques and examples of ways to utilize them at home. Patient was open to suggestions although she may require more assistance to utilize. Patient was encouraged pick 2 strategies from handout for next session that she will begin to utilize at home.     Plan  P: Follow up on memory management strategies. Ask which 2 did she choose to utilize. Assist with them if needed. Provide information on energy conservation techniques.        Patient will benefit from skilled therapeutic intervention in order to improve the following deficits and impairments:  Decreased endurance, Decreased activity tolerance, Decreased strength, Impaired UE functional use, Decreased safety awareness  Visit Diagnosis: Other symptoms and signs involving the musculoskeletal system    Problem List Patient Active Problem List   Diagnosis Date Noted  . Multiple myeloma ([place]) 03/15/2017  . Urinary frequency 02/28/2017  . SIRS (systemic inflammatory response syndrome) ([place]) 02/26/2017  . 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 Morbid obesity ([place]) 10/14/2016  . Vaginal dryness, menopausal 10/14/2016  . Neuropathic pain of both feet 04/11/2016  . TIA  (transient ischemic attack) 02/26/2016  . Lactose intolerance  in adult 02/01/2016  . Brain TIA 08/19/2015  . CKD (chronic kidney disease) stage 3, GFR 30-59 ml/min (HCC) 07/12/2015  . Lipoma of arm 01/19/2015  . Anemia 10/14/2014  . Lumbar back pain with radiculopathy affecting left lower extremity 05/11/2014  . Nocturnal hypoxia 02/04/2014  . Generalized anxiety disorder 05/20/2013  . Anxiety and depression 08/13/2012  . Abnormal brain scan 02/07/2012  . Thoracic or lumbosacral neuritis or radiculitis, unspecified 12/22/2010  . Hypercalcemia 11/30/2010  . Colon adenomas 05/11/2009  . HEMORRHOIDS 03/15/2009  . GERD 03/15/2009  . FATTY LIVER DISEASE 03/15/2009  . RENAL CALCULUS 03/15/2009  . Sleep apnea 09/15/2008  . Type 2 diabetes with nephropathy ([place]) 02/02/2008  . Hyperlipidemia LDL goal <100 09/17/2007  . Hypertension goal BP (blood pressure) < 130/80 09/17/2007   Ailene Ravel, OTR/L,CBIS  [phone]  09/19/2017, 3:01 PM  [place], [place], [address] Phone: [phone]   Fax:  [phone]  Name: LIOR CARTELLI MRN: [id number] Date of Birth: November 26, 1945

## 2017-09-19 NOTE — Telephone Encounter (Signed)
Called pt. No answer. Unable to leave voicemail.

## 2017-09-19 NOTE — Telephone Encounter (Signed)
-----   Message from Arnoldo Lenis, MD sent at 09/19/2017  1:58 PM EDT ----- Labs look good   Zandra Abts MD

## 2017-09-19 NOTE — Patient Instructions (Signed)
Management of Memory Problems  There are some general things you can do to help manage your memory problems.  Your memory may not in fact recover, but by using techniques and strategies you will be able to manage your memory difficulties better.  1)  Establish a routine.  Try to establish and then stick to a regular routine.  By doing this, you will get used to what to expect and you will reduce the need to rely on your memory.  Also, try to do things at the same time of day, such as taking your medication or checking your calendar first thing in the morning.  Think about think that you can do as a part of a regular routine and make a list.  Then enter them into a daily planner to remind you.  This will help you establish a routine.  2)  Organize your environment.  Organize your environment so that it is uncluttered.  Decrease visual stimulation.  Place everyday items such as keys or cell phone in the same place every day (ie.  Basket next to front door)  Use post it notes with a brief message to yourself (ie. Turn off light, lock the door)  Use labels to indicate where things go (ie. Which cupboards are for food, dishes, etc.)  Keep a notepad and pen by the telephone to take messages  3)  Memory Aids  A diary or journal/notebook/daily planner  Making a list (shopping list, chore list, to do list that needs to be done)  Using an alarm as a reminder (kitchen timer or cell phone alarm)  Using cell phone to store information (Notes, Calendar, Reminders)  Calendar/White board placed in a prominent position  Post-it notes  In order for memory aids to be useful, you need to have good habits.  It's no good remembering to make a note in your journal if you don't remember to look in it.  Try setting aside a certain time of day to look in journal.  4)  Improving mood and managing fatigue.  There may be other factors that contribute to memory difficulties.  Factors, such as anxiety,  depression and tiredness can affect memory.  Regular gentle exercise can help improve your mood and give you more energy.  Simple relaxation techniques may help relieve symptoms of anxiety  Try to get back to completing activities or hobbies you enjoyed doing in the past.  Learn to pace yourself through activities to decrease fatigue.  Find out about some local support groups where you can share experiences with others.  Try and achieve 7-8 hours of sleep at night. 

## 2017-09-19 NOTE — Therapy (Signed)
[place], [place], [address] Phone: [phone]   Fax:  [phone]  Physical Therapy Treatment / Re-assessment  Patient Details  Name: Cheryl Abbott MRN: [id number] Date of Birth: May 11, 1946 Referring Provider: Faythe Casa   As a licensed physical therapist I have read and agree with the content of the below note. [name] PT, DPT 5:15 PM, 09/19/17 [phone]   Encounter Date: 09/19/2017  PT End of Session - 09/19/17 1434    Visit Number  7    Number of Visits  12    Date for PT Re-Evaluation  10/09/17 minireassess complete 09/19/17    Authorization Type  medicare    Authorization Time Period  03/20-->10/09/17    Authorization - Visit Number  7    Authorization - Number of Visits  12    PT Start Time  1351    PT Stop Time  1434    PT Time Calculation (min)  43 min    Activity Tolerance  Patient tolerated treatment well;Patient limited by pain;Patient limited by fatigue;Patient limited by lethargy    Behavior During Therapy  [facility] for tasks assessed/performed       Past Medical History:  Diagnosis Date  . Anemia   . Anxiety   . Anxiety disorder   . Arthritis   . Cancer ([place])   . 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 Chronic back pain   . Depression   . Fatty liver   . GERD (gastroesophageal reflux disease)   . Headache   . History of adenomatous polyp of colon    2008  . History of kidney stones   . Hypercalcemia   . Hyperlipidemia   . Hypertension   . Left ureteral stone   . Lipoma of arm 01/19/2015  . Lumbar disc disease with radiculopathy   . Nephrolithiasis   . Neuropathy, peripheral   . OSA treated with BiPAP    per study 2007  . Sciatica   . Sigmoid diverticulosis   . Type 2 diabetes mellitus ([place])     Past Surgical History:  Procedure Laterality Date  . BIOPSY N/A 03/17/2013   Procedure: GASTRIC BIOPSIES;  Surgeon: Danie Binder, MD;  Location: [facility];  Service:  Endoscopy;  Laterality: N/A;  . BIOPSY  06/10/2015   Procedure: BIOPSY;  Surgeon: Danie Binder, MD;  [facility];  Service: Endoscopy;;  gastric biopsy  . CARDIAC CATHETERIZATION  05-11-2003  dr Shelva Majestic ([facility])   Abnormal cardiolite/   Normal coronary arteries and normal LVF,  ef  63%  . CARDIOVASCULAR STRESS TEST  01-01-2014   normal lexiscan cardiolite/  no ischemia/ infarct/  normal LV function and wall motion ,  ef 81%  . COLONOSCOPY  last one 10/02/2011   MOD [place] TICS, IH, NEXT TCS APR 2018  . COLONOSCOPY WITH PROPOFOL N/A 02/26/2017   Procedure: COLONOSCOPY WITH PROPOFOL;  Surgeon: Danie Binder, MD;  [facility];  Service: Endoscopy;  Laterality: N/A;  11:00am  . CYSTO/   RIGHT URETEROSOCOPY LASER LITHOTRIPSY STONE EXTRACTION  10-13-2004  . CYSTO/  RIGHT RETROGRADE PYELOGRAM/  PLACMENT RIGHT URETERAL STENT  01-10-2010  . CYSTOSCOPY W/ URETERAL STENT PLACEMENT Right 08/19/2015   Procedure: CYSTOSCOPY WITH RETROGRADE PYELOGRAM/URETERAL STENT PLACEMENT;  Surgeon: Franchot Gallo, MD;  Location: [facility];  Service: Urology;  Laterality: Right;  . CYSTOSCOPY WITH RETROGRADE PYELOGRAM, URETEROSCOPY AND STENT PLACEMENT Left 10/21/2014   Procedure:  [place], URETEROSCOPY AND STENT PLACEMENT;  Surgeon: Franchot Gallo, MD;  Location[facility];  Service: Urology;  Laterality: Left;  . CYSTOSCOPY WITH RETROGRADE PYELOGRAM, URETEROSCOPY AND STENT PLACEMENT Right 11/22/2015   Procedure: CYSTOSCOPY, RIGHT URETERAL STENT REMOVAL; RIGHT RETROGRADE PYELOGRAM, RIGHT URETEROSCOPY WITH BALLOON DILATION; RIGHT URETERAL STENT PLACEMENT;  Surgeon: Franchot Gallo, MD;  Location: [facility];  Service: Urology;  Laterality: Right;  . CYSTOSCOPY/RETROGRADE/URETEROSCOPY/STONE EXTRACTION WITH BASKET Bilateral 08/02/2015   Procedure: CYSTOSCOPY; BILATERAL RETROGRADE PYELOGRAMS; BILATERAL URETEROSCOPY, STONE EXTRACTION WITH  BASKET;  Surgeon: Franchot Gallo, MD;  Location: [facility];  Service: Urology;  Laterality: Bilateral;  . CYSTOSCOPY/RETROGRADE/URETEROSCOPY/STONE EXTRACTION WITH BASKET Right 06/28/2015   Procedure: CYSTOSCOPY/RETROGRADE/URETEROSCOPY/STONE EXTRACTION WITH BASKET, RIGHT URETERAL DOUBLE J STENT PLACEMENT;  Surgeon: Franchot Gallo, MD;  [facility];  Service: Urology;  Laterality: Right;  . ESOPHAGOGASTRODUODENOSCOPY (EGD) WITH PROPOFOL N/A 03/17/2013   Procedure: ESOPHAGOGASTRODUODENOSCOPY (EGD) WITH PROPOFOL;  Surgeon: Danie Binder, MD;  [facility];  Service: Endoscopy;  Laterality: N/A;  . ESOPHAGOGASTRODUODENOSCOPY (EGD) WITH PROPOFOL N/A 06/10/2015   Distal gastritis, distal esophageal stricture s/p dilation  . ESOPHAGOGASTRODUODENOSCOPY (EGD) WITH PROPOFOL N/A 02/26/2017   Procedure: ESOPHAGOGASTRODUODENOSCOPY (EGD) WITH PROPOFOL;  Surgeon: Danie Binder, MD;  [facility];  Service: Endoscopy;  Laterality: N/A;  . FLEXIBLE SIGMOIDOSCOPY  09/11/2011   [id number] Internal hemorrhoids  . HOLMIUM LASER APPLICATION Left 5/62/5638   Procedure: HOLMIUM LASER APPLICATION;  Surgeon: Franchot Gallo, MD;  [facility];  Service: Urology;  Laterality: Left;  . HOLMIUM LASER APPLICATION Right 9/37/3428   Procedure: HOLMIUM LASER APPLICATION;  Surgeon: Franchot Gallo, MD;  Location: [facility];  Service: Urology;  Laterality: Right;  . MASS EXCISION Left 03/04/2015   Procedure: EXCISION OF SOFT TISSUE NEOPLASM LEFT ARM;  Surgeon: Aviva Signs, MD;  Location: [facility];  Service: General;  Laterality: Left;  . PERCUTANEOUS NEPHROSTOLITHOTOMY Bilateral 12/  2011     [facility]  . POLYPECTOMY N/A 03/17/2013   Procedure: GASTRIC POLYPECTOMY;  Surgeon: Danie Binder, MD;  Location: [facility];  Service: Endoscopy;  Laterality: N/A;  . POLYPECTOMY  02/26/2017   Procedure: POLYPECTOMY;  Surgeon: Danie Binder, MD;  [facility];  Service: Endoscopy;;  polyp  at cecum, ascending colon polyps x3, hepatic flexure polyps x8, transverse colon polyps x8   . REMOVAL RIGHT THIGH CYST  2006  . SAVORY DILATION N/A 03/17/2013   Procedure: SAVORY DILATION;  Surgeon: Danie Binder, MD;  Location: [facility];  Service: Endoscopy;  Laterality: N/A;  #12.8, 14, 15, 16 dilators used  . SAVORY DILATION N/A 06/10/2015   Procedure: SAVORY DILATION;  Surgeon: Danie Binder, MD;  [facility];  Service: Endoscopy;  Laterality: N/A;  . TRANSTHORACIC ECHOCARDIOGRAM  01-01-2014   mild LVH/  ef 76-81%/  grade I diastolic dysfunction/  trivial MR, TR, and PR  . VAGINAL HYSTERECTOMY  1970's    There were no vitals filed for this visit.  Subjective Assessment - 09/19/17 1353    Subjective  Pt stated she feels very weak today, no energy.  Feels like she is going to faint.  No reports of recent falls.    Pertinent History  TIA 2017, DM, anemia, chronic kidney Dz, fatigut , depression, cancer     How long can you sit comfortably?  no problem    How long can you stand comfortably?  4/11: Stood for 11'12" while completeing FOTO questions, did require  HHA for balance confidence last 5', (was 2:30  tested)    How long can you walk comfortably?  4/11: ambulated for 6'11" prior need for rest break (was less than 3 minutes )    Currently in Pain?  Yes    Pain Score  5     Pain Location  Shoulder    Pain Orientation  Left    Pain Type  Acute pain    Aggravating Factors   weight bearing    Pain Relieving Factors  elevating LE         [facility] PT Assessment - 09/19/17 0001      Assessment   Medical Diagnosis  Weakness and fatigue    Referring Provider  Faythe Casa    Onset Date/Surgical Date  07/12/17    Next MD Visit  June, 2019    Prior Therapy  last year       Precautions   Precautions  Fall      Observation/Other Assessments   Focus on Therapeutic Outcomes (FOTO)   55% limitation      Functional Tests   Functional tests  Single leg stance;Sit to Stand       Single Leg Stance   Comments  Rt 9", Lt 6" max of 5      Sit to Stand   Comments  5 STS in 23" no HHA was 27.75 hands on knees      Strength   Right/Left Hip  Right;Left    Right Hip Flexion  3/5 was 2/5    Right Hip Extension  3/5 was 2/5    Right Hip ABduction  3/5 was 3/5    Left Hip Flexion  4-/5 was 3+/5    Left Hip Extension  3/5 was 3/5    Left Hip ABduction  3/5 was 3/5    Right/Left Knee  Right;Left    Right Knee Flexion  4-/5 was 4-/5    Right Knee Extension  4/5 was 3+/5    Left Knee Flexion  3+/5 was 3+/5    Left Knee Extension  4+/5 was 4/5    Right/Left Ankle  Right;Left    Right Ankle Dorsiflexion  3+/5 was 3/5    Right Ankle Plantar Flexion  3/5 was 3/5    Left Ankle Dorsiflexion  3/5 was 3/5      Ambulation/Gait   Ambulation Distance (Feet)  577 Feet    Assistive device  None    Gait Pattern  Decreased step length - right;Decreased step length - left    Ambulation Surface  Level    Gait velocity  .47    Gait Comments  6' 11"  prior fatigue and need for rest break (2 standing rest breaks)                   [facility] Adult PT Treatment/Exercise - 09/19/17 0001      Knee/Hip Exercises: Standing   SLS  Lt 6", Rt 9" max of 5    Other Standing Knee Exercises  standing tolerance: 11"12'    Other Standing Knee Exercises  walking duration testing: 6'11" with 2 standing rest breaks      Knee/Hip Exercises: Seated   Sit to Sand  without UE support;2 sets;5 reps 5 STS no HHA 23"               PT Short Term Goals - 09/19/17 1550      PT SHORT TERM GOAL #1   Title  Pt  to be able to single leg stance with both Le for 15 seconds to reduce the risk of falling.     Status  On-going      PT SHORT TERM GOAL #2   Title  Pt to be able to ambulate for six minute without resting to show progression in community ambulation.     Baseline  4/11: ambulated 6' 11" , did require 2 standing rest breaks during testing     Status  On-going      PT SHORT  TERM GOAL #3   Title  Pt to be able to complet five sit to stand in 20 seconds or less to demonstrate increased power of LE for funtional activity such as sit to stand, arising from a squatted position.     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     Time  6    Period  Weeks    Status  On-going      PT LONG TERM GOAL #3   Title  Pt to be able to complet 5 sit to stand in 15 seconds to demonstrate improve power of B LE to arise from lower level objects such as a car.    Time  6    Period  Weeks    Status  On-going      PT LONG TERM GOAL #4   Title  Pt B LE mm grade to increase by one level to allow pt to step  up onto a curb with confidence.     Time  6    Period  Weeks    Status  On-going            Plan - 09/19/17 1542    Clinical Impression Statement  Reviewed goals this session.  Pt unmotivated with HEP complaince and making small gains with activity tolerance and overall strengthening.  Pt educated on benefits of completeing HEP more regulary and setting a specific walking program rather than walking from room to room functional wise for maximal benefits.  Improved tolerance for standing (11'12" while completing the FOTO questions, did require HHA for the last 5' due to fatigue from standing) and able to ambulate for 6'11" prior fatigue requiring seated rest breaks.  Pt continues to ambulate at unsafe gait speed of .80ms.  Strength has improved by 1/2 grade of some musculature but continues to demonstrate weakness in hip musculature.      Rehab Potential  Fair    PT Frequency  2x / week    PT Duration  6 weeks    PT  Treatment/Interventions  Gait training;Functional mobility training;Therapeutic activities;Therapeutic exercise;Balance training;Neuromuscular re-education;Patient/family education    PT Next Visit Plan  Ambulate every session with goal of increasing ambulation distance by 50 ft. Continue to progress functional strength.  Check on HEP compliance.    PT Home Exercise Plan  eval:   heel raises,  squats, sit to stand, SLR, bridge and  sitting ankle Dorsi/plantar flexion .         Patient will benefit from skilled therapeutic intervention in order to improve the following deficits and impairments:  Abnormal gait, Decreased activity tolerance, Decreased balance, Decreased mobility, Decreased strength, Difficulty walking, Impaired perceived functional ability, Obesity, Pain  Visit Diagnosis: Muscle weakness (generalized)  Difficulty in walking, not elsewhere classified  Repeated falls  Other symptoms and signs involving the musculoskeletal system     Problem List Patient Active Problem List   Diagnosis Date Noted  . Multiple myeloma ([place]) 03/15/2017  . Urinary frequency 02/28/2017  . SIRS (systemic inflammatory response syndrome) ([place]) 02/26/2017  . 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 Morbid obesity ([place]) 10/14/2016  . Vaginal dryness, menopausal 10/14/2016  . Neuropathic pain of both feet 04/11/2016  . TIA (transient ischemic attack) 02/26/2016  . Lactose intolerance in adult 02/01/2016  . Brain TIA 08/19/2015  . CKD (chronic kidney disease) stage 3, GFR 30-59 ml/min ([facility]) 07/12/2015  . Lipoma of arm 01/19/2015  . Anemia 10/14/2014  . Lumbar back pain with radiculopathy affecting left lower extremity 05/11/2014  . Nocturnal hypoxia 02/04/2014  . Generalized anxiety disorder 05/20/2013  . Anxiety and depression 08/13/2012  . Abnormal brain scan 02/07/2012  . Thoracic or lumbosacral neuritis or radiculitis, unspecified 12/22/2010  . Hypercalcemia 11/30/2010  . Colon adenomas  05/11/2009  . HEMORRHOIDS 03/15/2009  . GERD 03/15/2009  . FATTY LIVER DISEASE 03/15/2009  . RENAL CALCULUS 03/15/2009  . Sleep apnea 09/15/2008  . 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 Hyperlipidemia LDL goal <100 09/17/2007  . Hypertension goal BP (blood pressure) < 130/80 09/17/2007   Ihor Austin, LPTA; CBIS [phone]  [name] 09/19/2017, 3:54 PM  [place], [place], [address] Phone: [phone]   Fax:  [phone]  Name: DONIELLE KAIGLER MRN: [id number] Date of Birth: 01/17/1946

## 2017-09-23 ENCOUNTER — Telehealth (HOSPITAL_COMMUNITY): Payer: Self-pay | Admitting: Physical Therapy

## 2017-09-23 NOTE — Telephone Encounter (Signed)
L/m  to pt cx 09/24/17 apptment per Amy has Jury Duty> NF 09/23/17

## 2017-09-24 ENCOUNTER — Ambulatory Visit (HOSPITAL_COMMUNITY): Payer: Medicare Other | Admitting: Physical Therapy

## 2017-09-24 ENCOUNTER — Telehealth (HOSPITAL_COMMUNITY): Payer: Self-pay | Admitting: Occupational Therapy

## 2017-09-24 NOTE — Telephone Encounter (Signed)
Patient call to cancel for today she is aching  and don't feel like coming out.

## 2017-09-26 ENCOUNTER — Encounter (HOSPITAL_COMMUNITY): Payer: Medicare Other | Admitting: Occupational Therapy

## 2017-09-26 ENCOUNTER — Ambulatory Visit (HOSPITAL_COMMUNITY): Payer: Medicare Other | Admitting: Physical Therapy

## 2017-09-26 DIAGNOSIS — M6281 Muscle weakness (generalized): Secondary | ICD-10-CM

## 2017-09-26 DIAGNOSIS — R262 Difficulty in walking, not elsewhere classified: Secondary | ICD-10-CM

## 2017-09-26 DIAGNOSIS — R296 Repeated falls: Secondary | ICD-10-CM | POA: Diagnosis not present

## 2017-09-26 DIAGNOSIS — R29898 Other symptoms and signs involving the musculoskeletal system: Secondary | ICD-10-CM

## 2017-09-26 NOTE — Therapy (Addendum)
[place], [place], [address] Phone: [phone]   Fax:  [phone]  Physical Therapy Treatment  Patient Details  Name: Cheryl Abbott MRN: [id number] Date of Birth: 08/27/1945 Referring Provider: Faythe Casa   Encounter Date: 09/26/2017  PT End of Session - 09/26/17 1428    Visit Number  8    Number of Visits  12    Date for PT Re-Evaluation  10/09/17 minireassess complete 09/19/17    Authorization Type  medicare    Authorization Time Period  03/20-->10/09/17    Authorization - Visit Number  8    Authorization - Number of Visits  12    PT Start Time  3716    PT Stop Time  1430    PT Time Calculation (min)  45 min    Activity Tolerance  Patient tolerated treatment well;Patient limited by pain;Patient limited by fatigue;Patient limited by lethargy    Behavior During Therapy  [facility] for tasks assessed/performed       Past Medical History:  Diagnosis Date  . Anemia   . Anxiety   . Anxiety disorder   . Arthritis   . Cancer ([place])   . 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 Chronic back pain   . Depression   . Fatty liver   . GERD (gastroesophageal reflux disease)   . Headache   . History of adenomatous polyp of colon    2008  . History of kidney stones   . Hypercalcemia   . Hyperlipidemia   . Hypertension   . Left ureteral stone   . Lipoma of arm 01/19/2015  . Lumbar disc disease with radiculopathy   . Nephrolithiasis   . Neuropathy, peripheral   . OSA treated with BiPAP    per study 2007  . Sciatica   . Sigmoid diverticulosis   . Type 2 diabetes mellitus ([place])     Past Surgical History:  Procedure Laterality Date  . BIOPSY N/A 03/17/2013   Procedure: GASTRIC BIOPSIES;  Surgeon: Danie Binder, MD;  [facility];  Service: Endoscopy;  Laterality: N/A;  . BIOPSY  06/10/2015   Procedure: BIOPSY;  Surgeon: Danie Binder, MD;  [facility];  Service: Endoscopy;;  gastric biopsy   . CARDIAC CATHETERIZATION  05-11-2003  dr Shelva Majestic ([facility])   Abnormal cardiolite/   Normal coronary arteries and normal LVF,  ef  63%  . CARDIOVASCULAR STRESS TEST  01-01-2014   normal lexiscan cardiolite/  no ischemia/ infarct/  normal LV function and wall motion ,  ef 81%  . COLONOSCOPY  last one 10/02/2011   MOD [place] TICS, IH, NEXT TCS APR 2018  . COLONOSCOPY WITH PROPOFOL N/A 02/26/2017   Procedure: COLONOSCOPY WITH PROPOFOL;  Surgeon: Danie Binder, MD;  [facility];  Service: Endoscopy;  Laterality: N/A;  11:00am  . CYSTO/   RIGHT URETEROSOCOPY LASER LITHOTRIPSY STONE EXTRACTION  10-13-2004  . CYSTO/  RIGHT RETROGRADE PYELOGRAM/  PLACMENT RIGHT URETERAL STENT  01-10-2010  . CYSTOSCOPY W/ URETERAL STENT PLACEMENT Right 08/19/2015   Procedure: CYSTOSCOPY WITH RETROGRADE PYELOGRAM/URETERAL STENT PLACEMENT;  Surgeon: Franchot Gallo, MD;  [facility];  Service: Urology;  Laterality: Right;  . CYSTOSCOPY WITH RETROGRADE PYELOGRAM, URETEROSCOPY AND STENT PLACEMENT Left 10/21/2014   Procedure: CYSTOSCOPY WITH RETROGRADE PYELOGRAM, URETEROSCOPY AND STENT PLACEMENT;  Surgeon: Franchot Gallo, MD;  Location: [facility];  Service: Urology;  Laterality: Left;  . CYSTOSCOPY WITH  RETROGRADE PYELOGRAM, URETEROSCOPY AND STENT PLACEMENT Right 11/22/2015   Procedure: CYSTOSCOPY, RIGHT URETERAL STENT REMOVAL; RIGHT RETROGRADE PYELOGRAM, RIGHT URETEROSCOPY WITH BALLOON DILATION; RIGHT URETERAL STENT PLACEMENT;  Surgeon: Franchot Gallo, MD;  [facility];  Service: Urology;  Laterality: Right;  . CYSTOSCOPY/RETROGRADE/URETEROSCOPY/STONE EXTRACTION WITH BASKET Bilateral 08/02/2015   Procedure: CYSTOSCOPY; BILATERAL RETROGRADE PYELOGRAMS; BILATERAL URETEROSCOPY, STONE EXTRACTION WITH BASKET;  Surgeon: Franchot Gallo, MD;  [facility];  Service: Urology;  Laterality: Bilateral;  . CYSTOSCOPY/RETROGRADE/URETEROSCOPY/STONE EXTRACTION WITH BASKET  Right 06/28/2015   Procedure: CYSTOSCOPY/RETROGRADE/URETEROSCOPY/STONE EXTRACTION WITH BASKET, RIGHT URETERAL DOUBLE J STENT PLACEMENT;  Surgeon: Franchot Gallo, MD;  [facility];  Service: Urology;  Laterality: Right;  . ESOPHAGOGASTRODUODENOSCOPY (EGD) WITH PROPOFOL N/A 03/17/2013   Procedure: ESOPHAGOGASTRODUODENOSCOPY (EGD) WITH PROPOFOL;  Surgeon: Danie Binder, MD;  [facility];  Service: Endoscopy;  Laterality: N/A;  . ESOPHAGOGASTRODUODENOSCOPY (EGD) WITH PROPOFOL N/A 06/10/2015   Distal gastritis, distal esophageal stricture s/p dilation  . ESOPHAGOGASTRODUODENOSCOPY (EGD) WITH PROPOFOL N/A 02/26/2017   Procedure: ESOPHAGOGASTRODUODENOSCOPY (EGD) WITH PROPOFOL;  Surgeon: Danie Binder, MD;  [facility];  Service: Endoscopy;  Laterality: N/A;  . FLEXIBLE SIGMOIDOSCOPY  09/11/2011   INO:[id number] Internal hemorrhoids  . HOLMIUM LASER APPLICATION Left 12/17/6281   Procedure: HOLMIUM LASER APPLICATION;  Surgeon: Franchot Gallo, MD;  Location: [facility];  Service: Urology;  Laterality: Left;  . HOLMIUM LASER APPLICATION Right 6/62/9476   Procedure: HOLMIUM LASER APPLICATION;  Surgeon: Franchot Gallo, MD;  [facility];  Service: Urology;  Laterality: Right;  . MASS EXCISION Left 03/04/2015   Procedure: EXCISION OF SOFT TISSUE NEOPLASM LEFT ARM;  Surgeon: Aviva Signs, MD;  Location: [facility];  Service: General;  Laterality: Left;  . PERCUTANEOUS NEPHROSTOLITHOTOMY Bilateral 12/  2011     [facility]  . POLYPECTOMY N/A 03/17/2013   Procedure: GASTRIC POLYPECTOMY;  Surgeon: Danie Binder, MD;  [facility];  Service: Endoscopy;  Laterality: N/A;  . POLYPECTOMY  02/26/2017   Procedure: POLYPECTOMY;  Surgeon: Danie Binder, MD;  [facility];  Service: Endoscopy;;  polyp at cecum, ascending colon polyps x3, hepatic flexure polyps x8, transverse colon polyps x8   . REMOVAL RIGHT THIGH CYST  2006  . SAVORY DILATION N/A 03/17/2013    Procedure: SAVORY DILATION;  Surgeon: Danie Binder, MD;  [facility];  Service: Endoscopy;  Laterality: N/A;  #12.8, 14, 15, 16 dilators used  . SAVORY DILATION N/A 06/10/2015   Procedure: SAVORY DILATION;  Surgeon: Danie Binder, MD;  [facility];  Service: Endoscopy;  Laterality: N/A;  . TRANSTHORACIC ECHOCARDIOGRAM  01-01-2014   mild LVH/  ef 54-65%/  grade I diastolic dysfunction/  trivial MR, TR, and PR  . VAGINAL HYSTERECTOMY  1970's    There were no vitals filed for this visit.  Subjective Assessment - 09/26/17 1435    Subjective  Pt states she had to go to ED over the weekend because her shoulders were hurting and she was achey.  STates they did not keep her.  Currently just achey all over and tired.     Currently in Pain?  Yes    Pain Score  5     Pain Location  Shoulder    Pain Orientation  Left    Pain Descriptors / Indicators  Aching    Pain Type  Chronic pain  [facility] Adult PT Treatment/Exercise - 09/26/17 0001      Ambulation/Gait   Ambulation Distance (Feet)  628 Feet in 4'20"    Assistive device  None    Gait Pattern  Decreased step length - right;Decreased step length - left    Ambulation Surface  Level    Gait velocity  .74    Gait Comments  4 minutes      Knee/Hip Exercises: Standing   Forward Step Up  Both;10 reps;Hand Hold: 1;Step Height: 6"    Step Down  Both;10 reps;Hand Hold: 1;Step Height: 6"    SLS with Vectors  5X3" each with 1 UE assist    Other Standing Knee Exercises  walking duration 4'20" without standing rest breaks                PT Short Term Goals - 09/19/17 1550      PT SHORT TERM GOAL #1   Title  Pt to be able to single leg stance with both Le for 15 seconds to reduce the risk of falling.     Status  On-going      PT SHORT TERM GOAL #2   Title  Pt to be able to ambulate for six minute without resting to show progression in community ambulation.     Baseline  4/11:  ambulated _0 , did require 2 standing rest breaks during testing     Status  On-going      PT SHORT TERM GOAL #3   Title  Pt to be able to complet five sit to stand in 20 seconds or less to demonstrate increased power of LE for funtional activity such as sit to stand, arising from a squatted position.     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     Time  6    Period  Weeks    Status  On-going      PT LONG TERM GOAL #3   Title  Pt to be able to complet 5 sit to stand in 15 seconds to demonstrate improve power of B LE to arise from lower level objects such as a car.    Time  6    Period  Weeks    Status  On-going      PT LONG TERM GOAL #4   Title  Pt B LE mm grade to increase by one level to allow pt to step  up onto a curb with confidence.     Time  6    Period  Weeks    Status  On-going            Plan - 09/26/17 1429    Clinical Impression Statement  Pt continues to present with overall slow mobiltiy.  Able to provoke faster ambulation today at BOS resulting in increased gait velocity of .74 m/sec.  Pt became exhausted at 4 minutes claiming  her LE's were hurting and could not go any farther.  Pt requires max encouragement to complete activities and cues for form and motivation.  No complaints of pain during session, however did require frequent seated breaks.  Added vector stance with cues for posture and hold times.  Pt very fatigued at EOS.    Rehab Potential  Fair    PT Frequency  2x / week    PT Duration  6 weeks    PT Treatment/Interventions  Gait training;Functional mobility training;Therapeutic  activities;Therapeutic exercise;Balance training;Neuromuscular re-education;Patient/family education    PT Next Visit Plan  Ambulate every session with goal of increasing ambulation distance by 50 ft. Continue to progress functional strength.  Check on HEP compliance.    PT Home Exercise Plan  eval:   heel raises, squats, sit to stand, SLR, bridge and  sitting ankle Dorsi/plantar flexion .         Patient will benefit from skilled therapeutic intervention in order to improve the following deficits and impairments:  Abnormal gait, Decreased activity tolerance, Decreased balance, Decreased mobility, Decreased strength, Difficulty walking, Impaired perceived functional ability, Obesity, Pain  Visit Diagnosis: Other symptoms and signs involving the musculoskeletal system  Muscle weakness (generalized)  Difficulty in walking, not elsewhere classified     Problem List Patient Active Problem List   Diagnosis Date Noted  . Multiple myeloma ([place]) 03/15/2017  . Urinary frequency 02/28/2017  . SIRS (systemic inflammatory response syndrome) ([place]) 02/26/2017  . 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 Morbid obesity ([place]) 10/14/2016  . Vaginal dryness, menopausal 10/14/2016  . Neuropathic pain of both feet 04/11/2016  . TIA (transient ischemic attack) 02/26/2016  . Lactose intolerance in adult 02/01/2016  . Brain TIA 08/19/2015  . CKD (chronic kidney disease) stage 3, GFR 30-59 ml/min (HCC) 07/12/2015  . Lipoma of arm 01/19/2015  . Anemia 10/14/2014  . Lumbar back pain with radiculopathy affecting left lower extremity 05/11/2014  . Nocturnal hypoxia 02/04/2014  . Generalized anxiety disorder 05/20/2013  . Anxiety and depression 08/13/2012  . Abnormal brain scan 02/07/2012  . Thoracic or lumbosacral neuritis or radiculitis, unspecified 12/22/2010  . Hypercalcemia 11/30/2010  . Colon adenomas 05/11/2009  . HEMORRHOIDS 03/15/2009  . GERD 03/15/2009  . FATTY LIVER DISEASE 03/15/2009  .  RENAL CALCULUS 03/15/2009  . Sleep apnea 09/15/2008  . 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 Hyperlipidemia LDL goal <100 09/17/2007  . Hypertension goal BP (blood pressure) < 130/80 09/17/2007    Teena Irani, PTA/CLT [phone]   Teena Irani 09/26/2017, 2:36 PM  [place], [place], [address] Phone: [phone]   Fax:  [phone]  Name: Cheryl Abbott MRN: [id number] Date of Birth: [birth year]

## 2017-10-01 ENCOUNTER — Ambulatory Visit (HOSPITAL_COMMUNITY): Payer: Medicare Other | Admitting: Physical Therapy

## 2017-10-01 ENCOUNTER — Encounter (HOSPITAL_COMMUNITY): Payer: Self-pay | Admitting: Physical Therapy

## 2017-10-01 ENCOUNTER — Telehealth (HOSPITAL_COMMUNITY): Payer: Self-pay | Admitting: Family Medicine

## 2017-10-01 DIAGNOSIS — M6281 Muscle weakness (generalized): Secondary | ICD-10-CM | POA: Diagnosis not present

## 2017-10-01 DIAGNOSIS — R296 Repeated falls: Secondary | ICD-10-CM | POA: Diagnosis not present

## 2017-10-01 DIAGNOSIS — R262 Difficulty in walking, not elsewhere classified: Secondary | ICD-10-CM | POA: Diagnosis not present

## 2017-10-01 DIAGNOSIS — R29898 Other symptoms and signs involving the musculoskeletal system: Secondary | ICD-10-CM | POA: Diagnosis not present

## 2017-10-01 NOTE — Therapy (Signed)
[place], [place], [address] Phone: [phone]   Fax:  [phone]  Physical Therapy Treatment  Patient Details  Name: Cheryl Abbott MRN: [id number] Date of Birth: Feb 21, 1946 Referring Provider: Faythe Casa   Encounter Date: 10/01/2017  PT End of Session - 10/01/17 1352    Visit Number  9    Number of Visits  12    Date for PT Re-Evaluation  10/09/17 minireassess complete 09/19/17    Authorization Type  medicare    Authorization Time Period  03/20-->10/09/17    Authorization - Visit Number  9    Authorization - Number of Visits  12    PT Start Time  1350    PT Stop Time  1435    PT Time Calculation (min)  45 min    Activity Tolerance  Patient tolerated treatment well;Patient limited by pain;Patient limited by fatigue;Patient limited by lethargy    Behavior During Therapy  [facility] for tasks assessed/performed       Past Medical History:  Diagnosis Date  . Anemia   . Anxiety   . Anxiety disorder   . Arthritis   . Cancer ([place])   . 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 Chronic back pain   . Depression   . Fatty liver   . GERD (gastroesophageal reflux disease)   . Headache   . History of adenomatous polyp of colon    2008  . History of kidney stones   . Hypercalcemia   . Hyperlipidemia   . Hypertension   . Left ureteral stone   . Lipoma of arm 01/19/2015  . Lumbar disc disease with radiculopathy   . Nephrolithiasis   . Neuropathy, peripheral   . OSA treated with BiPAP    per study 2007  . Sciatica   . Sigmoid diverticulosis   . Type 2 diabetes mellitus ([place])     Past Surgical History:  Procedure Laterality Date  . BIOPSY N/A 03/17/2013   Procedure: GASTRIC BIOPSIES;  Surgeon: Danie Binder, MD;  [facility];  Service: Endoscopy;  Laterality: N/A;  . BIOPSY  06/10/2015   Procedure: BIOPSY;  Surgeon: Danie Binder, MD;  [facility];  Service: Endoscopy;;  gastric biopsy   . CARDIAC CATHETERIZATION  05-11-2003  dr Shelva Majestic ([facility])   Abnormal cardiolite/   Normal coronary arteries and normal LVF,  ef  63%  . CARDIOVASCULAR STRESS TEST  01-01-2014   normal lexiscan cardiolite/  no ischemia/ infarct/  normal LV function and wall motion ,  ef 81%  . COLONOSCOPY  last one 10/02/2011   MOD [place] TICS, IH, NEXT TCS APR 2018  . COLONOSCOPY WITH PROPOFOL N/A 02/26/2017   Procedure: COLONOSCOPY WITH PROPOFOL;  Surgeon: Danie Binder, MD;  [facility];  Service: Endoscopy;  Laterality: N/A;  11:00am  . CYSTO/   RIGHT URETEROSOCOPY LASER LITHOTRIPSY STONE EXTRACTION  10-13-2004  . CYSTO/  RIGHT RETROGRADE PYELOGRAM/  PLACMENT RIGHT URETERAL STENT  01-10-2010  . CYSTOSCOPY W/ URETERAL STENT PLACEMENT Right 08/19/2015   Procedure: CYSTOSCOPY WITH RETROGRADE PYELOGRAM/URETERAL STENT PLACEMENT;  Surgeon: Franchot Gallo, MD;  [facility];  Service: Urology;  Laterality: Right;  . CYSTOSCOPY WITH RETROGRADE PYELOGRAM, URETEROSCOPY AND STENT PLACEMENT Left 10/21/2014   Procedure: CYSTOSCOPY WITH RETROGRADE PYELOGRAM, URETEROSCOPY AND STENT PLACEMENT;  Surgeon: Franchot Gallo, MD;  Location: [facility];  Service: Urology;  Laterality: Left;  . CYSTOSCOPY WITH  RETROGRADE PYELOGRAM, URETEROSCOPY AND STENT PLACEMENT Right 11/22/2015   Procedure: CYSTOSCOPY, RIGHT URETERAL STENT REMOVAL; RIGHT RETROGRADE PYELOGRAM, RIGHT URETEROSCOPY WITH BALLOON DILATION; RIGHT URETERAL STENT PLACEMENT;  Surgeon: Franchot Gallo, MD;  [facility];  Service: Urology;  Laterality: Right;  . CYSTOSCOPY/RETROGRADE/URETEROSCOPY/STONE EXTRACTION WITH BASKET Bilateral 08/02/2015   Procedure: CYSTOSCOPY; BILATERAL RETROGRADE PYELOGRAMS; BILATERAL URETEROSCOPY, STONE EXTRACTION WITH BASKET;  Surgeon: Franchot Gallo, MD;  [facility];  Service: Urology;  Laterality: Bilateral;  . CYSTOSCOPY/RETROGRADE/URETEROSCOPY/STONE EXTRACTION WITH BASKET  Right 06/28/2015   Procedure: CYSTOSCOPY/RETROGRADE/URETEROSCOPY/STONE EXTRACTION WITH BASKET, RIGHT URETERAL DOUBLE J STENT PLACEMENT;  Surgeon: Franchot Gallo, MD;  [facility];  Service: Urology;  Laterality: Right;  . ESOPHAGOGASTRODUODENOSCOPY (EGD) WITH PROPOFOL N/A 03/17/2013   Procedure: ESOPHAGOGASTRODUODENOSCOPY (EGD) WITH PROPOFOL;  Surgeon: Danie Binder, MD;  [facility];  Service: Endoscopy;  Laterality: N/A;  . ESOPHAGOGASTRODUODENOSCOPY (EGD) WITH PROPOFOL N/A 06/10/2015   Distal gastritis, distal esophageal stricture s/p dilation  . ESOPHAGOGASTRODUODENOSCOPY (EGD) WITH PROPOFOL N/A 02/26/2017   Procedure: ESOPHAGOGASTRODUODENOSCOPY (EGD) WITH PROPOFOL;  Surgeon: Danie Binder, MD;  [facility];  Service: Endoscopy;  Laterality: N/A;  . FLEXIBLE SIGMOIDOSCOPY  09/11/2011   [id number] Internal hemorrhoids  . HOLMIUM LASER APPLICATION Left 3/90/3009   Procedure: HOLMIUM LASER APPLICATION;  Surgeon: Franchot Gallo, MD;  Location: [facility];  Service: Urology;  Laterality: Left;  . HOLMIUM LASER APPLICATION Right 2/33/0076   Procedure: HOLMIUM LASER APPLICATION;  Surgeon: Franchot Gallo, MD;  [facility];  Service: Urology;  Laterality: Right;  . MASS EXCISION Left 03/04/2015   Procedure: EXCISION OF SOFT TISSUE NEOPLASM LEFT ARM;  Surgeon: Aviva Signs, MD;  Location: [facility];  Service: General;  Laterality: Left;  . PERCUTANEOUS NEPHROSTOLITHOTOMY Bilateral 12/  2011     [facility]  . POLYPECTOMY N/A 03/17/2013   Procedure: GASTRIC POLYPECTOMY;  Surgeon: Danie Binder, MD;  [facility];  Service: Endoscopy;  Laterality: N/A;  . POLYPECTOMY  02/26/2017   Procedure: POLYPECTOMY;  Surgeon: Danie Binder, MD;  [facility];  Service: Endoscopy;;  polyp at cecum, ascending colon polyps x3, hepatic flexure polyps x8, transverse colon polyps x8   . REMOVAL RIGHT THIGH CYST  2006  . SAVORY DILATION N/A 03/17/2013    Procedure: SAVORY DILATION;  Surgeon: Danie Binder, MD;  [facility];  Service: Endoscopy;  Laterality: N/A;  #12.8, 14, 15, 16 dilators used  . SAVORY DILATION N/A 06/10/2015   Procedure: SAVORY DILATION;  Surgeon: Danie Binder, MD;  [facility];  Service: Endoscopy;  Laterality: N/A;  . TRANSTHORACIC ECHOCARDIOGRAM  01-01-2014   mild LVH/  ef 22-63%/  grade I diastolic dysfunction/  trivial MR, TR, and PR  . VAGINAL HYSTERECTOMY  1970's    There were no vitals filed for this visit.  Subjective Assessment - 10/01/17 1352    Subjective  PT states that she has not been walking for exercise as discussed at evaluation.  Therapist went over the need to walk for exercise everyday.     Pertinent History  TIA 2017, DM, anemia, chronic kidney Dz, fatigut , depression, cancer     How long can you sit comfortably?  no problem    How long can you stand comfortably?  4/11: Stood for 11'12" while completeing FOTO questions, did require HHA for balance confidence last 5', (was 2:30  tested)    How long can you walk comfortably?  4/11: ambulated for 6'11" prior need  for rest break (was less than 3 minutes )    Currently in Pain?  Yes    Pain Score  6     Pain Location  -- all over     Pain Onset  More than a month ago    Pain Frequency  Intermittent    Aggravating Factors   any acttivity     Pain Relieving Factors  lieing flat on her back                        [facility] Adult PT Treatment/Exercise - 10/01/17 0001      Ambulation/Gait   Ambulation Distance (Feet)  410 Feet    Assistive device  Small based quad cane      Exercises   Exercises  Knee/Hip      Knee/Hip Exercises: Standing   Heel Raises  10 reps    Forward Step Up  Both;10 reps;Hand Hold: 1;Step Height: 6"    Step Down  Both;10 reps;Hand Hold: 1;Step Height: 6"    Functional Squat  10 reps    Other Standing Knee Exercises  tandem stance x 2     Other Standing Knee Exercises  side step with green  tband x 2 RT      Knee/Hip Exercises: Seated   Sit to Sand  10 reps               PT Short Term Goals - 09/19/17 1550      PT SHORT TERM GOAL #1   Title  Pt to be able to single leg stance with both Le for 15 seconds to reduce the risk of falling.     Status  On-going      PT SHORT TERM GOAL #2   Title  Pt to be able to ambulate for six minute without resting to show progression in community ambulation.     Baseline  4/11: ambulated _0 , did require 2 standing rest breaks during testing     Status  On-going      PT SHORT TERM GOAL #3   Title  Pt to be able to complet five sit to stand in 20 seconds or less to demonstrate increased power of LE for funtional activity such as sit to stand, arising from a squatted position.     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     Time  6    Period  Weeks    Status  On-going      PT LONG TERM GOAL #3   Title  Pt to be able to complet 5 sit to stand in 15 seconds to demonstrate improve power of B LE to arise from lower level objects such as a car.    Time  6    Period  Weeks    Status  On-going  PT LONG TERM GOAL #4   Title  Pt B LE mm grade to increase by one level to allow pt to step  up onto a curb with confidence.     Time  6    Period  Weeks    Status  On-going            Plan - 10/01/17 1414    Clinical Impression Statement  PT stating that she is hurting all over today.  Needed to remind pt to pick up her feet while walking.  Constant cuing to perform exercises properly.  Added tandem stance to pomote balance.     Rehab Potential  Fair    PT Frequency   2x / week    PT Duration  6 weeks    PT Treatment/Interventions  Gait training;Functional mobility training;Therapeutic activities;Therapeutic exercise;Balance training;Neuromuscular re-education;Patient/family education    PT Next Visit Plan  Ambulate every session with goal of increasing ambulation distance by 50 ft. Continue to progress functional strength.  Continue with balance activity adding PAVLO with tandem stance.     PT Home Exercise Plan  eval:   heel raises, squats, sit to stand, SLR, bridge and  sitting ankle Dorsi/plantar flexion .         Patient will benefit from skilled therapeutic intervention in order to improve the following deficits and impairments:  Abnormal gait, Decreased activity tolerance, Decreased balance, Decreased mobility, Decreased strength, Difficulty walking, Impaired perceived functional ability, Obesity, Pain  Visit Diagnosis: Other symptoms and signs involving the musculoskeletal system  Muscle weakness (generalized)  Difficulty in walking, not elsewhere classified     Problem List Patient Active Problem List   Diagnosis Date Noted  . Multiple myeloma ([place]) 03/15/2017  . Urinary frequency 02/28/2017  . SIRS (systemic inflammatory response syndrome) ([place]) 02/26/2017  . 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 Morbid obesity ([place]) 10/14/2016  . Vaginal dryness, menopausal 10/14/2016  . Neuropathic pain of both feet 04/11/2016  . TIA (transient ischemic attack) 02/26/2016  . Lactose intolerance in adult 02/01/2016  . Brain TIA 08/19/2015  . CKD (chronic kidney disease) stage 3, GFR 30-59 ml/min (HCC) 07/12/2015  . Lipoma of arm 01/19/2015  . Anemia 10/14/2014  . Lumbar back pain with radiculopathy affecting left lower extremity 05/11/2014  . Nocturnal hypoxia 02/04/2014  . Generalized anxiety disorder 05/20/2013  . Anxiety and depression 08/13/2012  . Abnormal brain scan 02/07/2012  . Thoracic or lumbosacral neuritis or radiculitis,  unspecified 12/22/2010  . Hypercalcemia 11/30/2010  . Colon adenomas 05/11/2009  . HEMORRHOIDS 03/15/2009  . GERD 03/15/2009  . FATTY LIVER DISEASE 03/15/2009  . RENAL CALCULUS 03/15/2009  . Sleep apnea 09/15/2008  . Type 2 diabetes with nephropathy ([place]) 02/02/2008  . Hyperlipidemia LDL goal <100 09/17/2007  . Hypertension goal BP (blood pressure) < 130/80 09/17/2007  Rayetta Humphrey, PT CLT [phone] 10/01/2017, 2:31 PM  [facility] [address] [place], [place], [address] Phone: [phone]   Fax:  [phone]  Name: Cheryl Abbott MRN: [id number] Date of Birth: 12-Feb-1946

## 2017-10-01 NOTE — Telephone Encounter (Signed)
10/01/17  I offered 4/29 for OT but then nothing for PT on that day... pt said to cx the 145 OT and would take PT and if something opens up while she is here she will take OT

## 2017-10-03 ENCOUNTER — Encounter (HOSPITAL_COMMUNITY): Payer: Self-pay | Admitting: Occupational Therapy

## 2017-10-03 ENCOUNTER — Ambulatory Visit (HOSPITAL_COMMUNITY): Payer: Medicare Other | Admitting: Occupational Therapy

## 2017-10-03 ENCOUNTER — Ambulatory Visit (HOSPITAL_COMMUNITY): Payer: Medicare Other | Admitting: Physical Therapy

## 2017-10-03 DIAGNOSIS — R262 Difficulty in walking, not elsewhere classified: Secondary | ICD-10-CM

## 2017-10-03 DIAGNOSIS — R29898 Other symptoms and signs involving the musculoskeletal system: Secondary | ICD-10-CM | POA: Diagnosis not present

## 2017-10-03 DIAGNOSIS — R296 Repeated falls: Secondary | ICD-10-CM | POA: Diagnosis not present

## 2017-10-03 DIAGNOSIS — M6281 Muscle weakness (generalized): Secondary | ICD-10-CM

## 2017-10-03 NOTE — Therapy (Signed)
[place], [place], [address] Phone: [phone]   Fax:  [phone]  Physical Therapy Treatment  Patient Details  Name: Cheryl Abbott MRN: [id number] Date of Birth: [birth year] Referring Provider: Faythe Casa   Encounter Date: 10/03/2017  PT End of Session - 10/03/17 1558    Visit Number  10    Number of Visits  12    Date for PT Re-Evaluation  10/09/17 minireassess complete 09/19/17    Authorization Type  medicare    Authorization Time Period  03/20-->10/09/17    Authorization - Visit Number  10    Authorization - Number of Visits  12    PT Start Time  8546    PT Stop Time  1558    PT Time Calculation (min)  40 min    Activity Tolerance  Patient tolerated treatment well;Patient limited by pain;Patient limited by fatigue;Patient limited by lethargy    Behavior During Therapy  [facility] for tasks assessed/performed       Past Medical History:  Diagnosis Date  . Anemia   . Anxiety   . Anxiety disorder   . Arthritis   . Cancer ([place])   . 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 Chronic back pain   . Depression   . Fatty liver   . GERD (gastroesophageal reflux disease)   . Headache   . History of adenomatous polyp of colon    2008  . History of kidney stones   . Hypercalcemia   . Hyperlipidemia   . Hypertension   . Left ureteral stone   . Lipoma of arm 01/19/2015  . Lumbar disc disease with radiculopathy   . Nephrolithiasis   . Neuropathy, peripheral   . OSA treated with BiPAP    per study 2007  . Sciatica   . Sigmoid diverticulosis   . Type 2 diabetes mellitus ([place])     Past Surgical History:  Procedure Laterality Date  . BIOPSY N/A 03/17/2013   Procedure: GASTRIC BIOPSIES;  Surgeon: Danie Binder, MD;  Location: [facility];  Service: Endoscopy;  Laterality: N/A;  . BIOPSY  06/10/2015   Procedure: BIOPSY;  Surgeon: Danie Binder, MD;  [facility];  Service: Endoscopy;;  gastric  biopsy  . CARDIAC CATHETERIZATION  05-11-2003  dr Shelva Majestic ([facility])   Abnormal cardiolite/   Normal coronary arteries and normal LVF,  ef  63%  . CARDIOVASCULAR STRESS TEST  01-01-2014   normal lexiscan cardiolite/  no ischemia/ infarct/  normal LV function and wall motion ,  ef 81%  . COLONOSCOPY  last one 10/02/2011   [facility] [place] TICS, IH, NEXT TCS APR 2018  . COLONOSCOPY WITH PROPOFOL N/A 02/26/2017   Procedure: COLONOSCOPY WITH PROPOFOL;  Surgeon: Danie Binder, MD;  [facility];  Service: Endoscopy;  Laterality: N/A;  11:00am  . CYSTO/   RIGHT URETEROSOCOPY LASER LITHOTRIPSY STONE EXTRACTION  10-13-2004  . CYSTO/  RIGHT RETROGRADE PYELOGRAM/  PLACMENT RIGHT URETERAL STENT  01-10-2010  . CYSTOSCOPY W/ URETERAL STENT PLACEMENT Right 08/19/2015   Procedure: CYSTOSCOPY WITH RETROGRADE PYELOGRAM/URETERAL STENT PLACEMENT;  Surgeon: Franchot Gallo, MD;  Location: [facility];  Service: Urology;  Laterality: Right;  . CYSTOSCOPY WITH RETROGRADE PYELOGRAM, URETEROSCOPY AND STENT PLACEMENT Left 10/21/2014   Procedure: CYSTOSCOPY WITH RETROGRADE PYELOGRAM, URETEROSCOPY AND STENT PLACEMENT;  Surgeon: Franchot Gallo, MD;  Location: [facility];  Service: Urology;  Laterality: Left;  . CYSTOSCOPY WITH  RETROGRADE PYELOGRAM, URETEROSCOPY AND STENT PLACEMENT Right 11/22/2015   Procedure: CYSTOSCOPY, RIGHT URETERAL STENT REMOVAL; RIGHT RETROGRADE PYELOGRAM, RIGHT URETEROSCOPY WITH BALLOON DILATION; RIGHT URETERAL STENT PLACEMENT;  Surgeon: Franchot Gallo, MD;  [facility]: [facility];  Service: Urology;  Laterality: Right;  . CYSTOSCOPY/RETROGRADE/URETEROSCOPY/STONE EXTRACTION WITH BASKET Bilateral 08/02/2015   Procedure: CYSTOSCOPY; BILATERAL RETROGRADE PYELOGRAMS; BILATERAL URETEROSCOPY, STONE EXTRACTION WITH BASKET;  Surgeon: Franchot Gallo, MD;  [facility]: [facility];  Service: Urology;  Laterality: Bilateral;  . CYSTOSCOPY/RETROGRADE/URETEROSCOPY/STONE EXTRACTION WITH  BASKET Right 06/28/2015   Procedure: CYSTOSCOPY/RETROGRADE/URETEROSCOPY/STONE EXTRACTION WITH BASKET, RIGHT URETERAL DOUBLE J STENT PLACEMENT;  Surgeon: Franchot Gallo, MD;  Location: [facility];  Service: Urology;  Laterality: Right;  . ESOPHAGOGASTRODUODENOSCOPY (EGD) WITH PROPOFOL N/A 03/17/2013   Procedure: ESOPHAGOGASTRODUODENOSCOPY (EGD) WITH PROPOFOL;  Surgeon: Danie Binder, MD;  [facility];  Service: Endoscopy;  Laterality: N/A;  . ESOPHAGOGASTRODUODENOSCOPY (EGD) WITH PROPOFOL N/A 06/10/2015   Distal gastritis, distal esophageal stricture s/p dilation  . ESOPHAGOGASTRODUODENOSCOPY (EGD) WITH PROPOFOL N/A 02/26/2017   Procedure: ESOPHAGOGASTRODUODENOSCOPY (EGD) WITH PROPOFOL;  Surgeon: Danie Binder, MD;  [facility];  Service: Endoscopy;  Laterality: N/A;  . FLEXIBLE SIGMOIDOSCOPY  09/11/2011   [facility]:[id number] Internal hemorrhoids  . HOLMIUM LASER APPLICATION Left 3/66/2947   Procedure: HOLMIUM LASER APPLICATION;  Surgeon: Franchot Gallo, MD;  Location: [facility];  Service: Urology;  Laterality: Left;  . HOLMIUM LASER APPLICATION Right 6/54/6503   Procedure: HOLMIUM LASER APPLICATION;  Surgeon: Franchot Gallo, MD;  Location: [facility];  Service: Urology;  Laterality: Right;  . MASS EXCISION Left 03/04/2015   Procedure: EXCISION OF SOFT TISSUE NEOPLASM LEFT ARM;  Surgeon: Aviva Signs, MD;  Location: [facility];  Service: General;  Laterality: Left;  . PERCUTANEOUS NEPHROSTOLITHOTOMY Bilateral 12/  2011     [facility]  . POLYPECTOMY N/A 03/17/2013   Procedure: GASTRIC POLYPECTOMY;  Surgeon: Danie Binder, MD;  Location: [facility];  Service: Endoscopy;  Laterality: N/A;  . POLYPECTOMY  02/26/2017   Procedure: POLYPECTOMY;  Surgeon: Danie Binder, MD;  [facility];  Service: Endoscopy;;  polyp at cecum, ascending colon polyps x3, hepatic flexure polyps x8, transverse colon polyps x8   . REMOVAL RIGHT THIGH CYST  2006  . SAVORY DILATION N/A 03/17/2013    Procedure: SAVORY DILATION;  Surgeon: Danie Binder, MD;  Location: [facility];  Service: Endoscopy;  Laterality: N/A;  #12.8, 14, 15, 16 dilators used  . SAVORY DILATION N/A 06/10/2015   Procedure: SAVORY DILATION;  Surgeon: Danie Binder, MD;  [facility];  Service: Endoscopy;  Laterality: N/A;  . TRANSTHORACIC ECHOCARDIOGRAM  01-01-2014   mild LVH/  ef 54-65%/  grade I diastolic dysfunction/  trivial MR, TR, and PR  . VAGINAL HYSTERECTOMY  1970's    There were no vitals filed for this visit.  Subjective Assessment - 10/03/17 1528    Subjective  Pt states she's unsure if she will be able to complete a full session today.  States the bottom of her stomach has been hurting today, 5/10 like "something is fixing to drop out of it".  States she may contact her MD early and see if she can go to the ED about it.     Currently in Pain?  Yes    Pain Score  5     Pain Location  Abdomen    Pain Orientation  Lower  [facility] Adult PT Treatment/Exercise - 10/03/17 0001      Ambulation/Gait   Ambulation Distance (Feet)  386 Feet took 5 minutes to complete this distance      Knee/Hip Exercises: Standing   Forward Lunges  Both;10 reps;Limitations    Forward Lunges Limitations  onto 4" step with 1 UE    Forward Step Up  Both;Hand Hold: 1;Step Height: 6";15 reps    Step Down  Both;Hand Hold: 1;Step Height: 6";15 reps      Knee/Hip Exercises: Seated   Sit to Sand  10 reps               PT Short Term Goals - 09/19/17 1550      PT SHORT TERM GOAL #1   Title  Pt to be able to single leg stance with both Le for 15 seconds to reduce the risk of falling.     Status  On-going      PT SHORT TERM GOAL #2   Title  Pt to be able to ambulate for six minute without resting to show progression in community ambulation.     Baseline  4/11: ambulated _0 , did require 2 standing rest breaks during testing     Status  On-going      PT SHORT TERM GOAL #3    Title  Pt to be able to complet five sit to stand in 20 seconds or less to demonstrate increased power of LE for funtional activity such as sit to stand, arising from a squatted position.     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     Time  6    Period  Weeks    Status  On-going      PT LONG TERM GOAL #3   Title  Pt to be able to complet 5 sit to stand in 15 seconds to demonstrate improve power of B LE to arise from lower level objects such as a car.    Time  6    Period  Weeks    Status  On-going      PT LONG TERM GOAL #4   Title  Pt B LE mm grade to increase by one level to allow pt to step  up onto a curb with confidence.     Time  6    Period  Weeks    Status  On-going            Plan - 10/03/17 1540    Clinical Impression Statement  Pt with abdominal pain today heeding her performance in therapy.  ONly able to ambulate 386 feet today and took approx 5 minutes to complete.  Frequent rest breaks needed today with noted fatigue, yawning at times.  Continued slow mobilty and lethargy.  Recommended pateint contact MD if symptoms continue or worsen.  PT able to complete full session although she reported she  didn't think she would be able to.  Reveiwed HEP, however states she is only walking 3 minutes once daily through her house and doing LAQ only.  Reveiwed additional therex she was instructed to do, however states she is not doing these.  Instructed to increase 3 minute walks to at least 3X a day rather than once.  did not add palov activity today as pateint was too lethargic.    Rehab Potential  Fair    PT Frequency  2x /  week    PT Duration  6 weeks    PT Treatment/Interventions  Gait training;Functional mobility training;Therapeutic activities;Therapeutic exercise;Balance training;Neuromuscular re-education;Patient/family education    PT Next Visit Plan  Ambulate every session with goal of increasing ambulation distance by 50 ft. Continue to progress functional strength.  Re-evaluate next session.     PT Home Exercise Plan  eval:   heel raises, squats, sit to stand, SLR, bridge and  sitting ankle Dorsi/plantar flexion .         Patient will benefit from skilled therapeutic intervention in order to improve the following deficits and impairments:  Abnormal gait, Decreased activity tolerance, Decreased balance, Decreased mobility, Decreased strength, Difficulty walking, Impaired perceived functional ability, Obesity, Pain  Visit Diagnosis: Difficulty in walking, not elsewhere classified  Repeated falls     Problem List Patient Active Problem List   Diagnosis Date Noted  . Multiple myeloma ([place]) 03/15/2017  . Urinary frequency 02/28/2017  . SIRS (systemic inflammatory response syndrome) ([place]) 02/26/2017  . 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 Morbid obesity ([place]) 10/14/2016  . Vaginal dryness, menopausal 10/14/2016  . Neuropathic pain of both feet 04/11/2016  . TIA (transient ischemic attack) 02/26/2016  . Lactose intolerance in adult 02/01/2016  . Brain TIA 08/19/2015  . CKD (chronic kidney disease) stage 3, GFR 30-59 ml/min ([facility]) 07/12/2015  . Lipoma of arm 01/19/2015  . Anemia 10/14/2014  . Lumbar back pain with radiculopathy affecting left lower extremity 05/11/2014  . Nocturnal hypoxia 02/04/2014  . Generalized anxiety disorder 05/20/2013  . Anxiety and depression 08/13/2012  . Abnormal brain scan 02/07/2012  . Thoracic or lumbosacral neuritis or radiculitis, unspecified 12/22/2010  . Hypercalcemia 11/30/2010  . Colon adenomas 05/11/2009  . HEMORRHOIDS 03/15/2009  . GERD 03/15/2009   . FATTY LIVER DISEASE 03/15/2009  . RENAL CALCULUS 03/15/2009  . Sleep apnea 09/15/2008  . 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 Hyperlipidemia LDL goal <100 09/17/2007  . Hypertension goal BP (blood pressure) < 130/80 09/17/2007   Teena Irani, PTA/CLT [phone]  Teena Irani 10/03/2017, 4:00 PM  [place], [place], [address] Phone: [phone]   Fax:  [phone]  Name: Cheryl Abbott MRN: [id number] Date of Birth: [birth year]

## 2017-10-03 NOTE — Therapy (Signed)
[place], [place], [address] Phone: [phone]   Fax:  [phone]  Occupational Therapy Reassessment, Treatment, Discharge Summary  Patient Details  Name: Cheryl Abbott MRN: [id number] Date of Birth: 1945/12/23 Referring Provider: Faythe Casa   Encounter Date: 10/03/2017  OT End of Session - 10/03/17 1642    Visit Number  3    Number of Visits  5    Date for OT Re-Evaluation  10/03/17    Authorization Type  [employer] Medicare    OT Start Time  1600    OT Stop Time  1632    OT Time Calculation (min)  32 min    Activity Tolerance  Patient tolerated treatment well    Behavior During Therapy  [facility] for tasks assessed/performed       Past Medical History:  Diagnosis Date  . Anemia   . Anxiety   . Anxiety disorder   . Arthritis   . Cancer ([place])   . 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 Chronic back pain   . Depression   . Fatty liver   . GERD (gastroesophageal reflux disease)   . Headache   . History of adenomatous polyp of colon    2008  . History of kidney stones   . Hypercalcemia   . Hyperlipidemia   . Hypertension   . Left ureteral stone   . Lipoma of arm 01/19/2015  . Lumbar disc disease with radiculopathy   . Nephrolithiasis   . Neuropathy, peripheral   . OSA treated with BiPAP    per study 2007  . Sciatica   . Sigmoid diverticulosis   . Type 2 diabetes mellitus ([place])     Past Surgical History:  Procedure Laterality Date  . BIOPSY N/A 03/17/2013   Procedure: GASTRIC BIOPSIES;  Surgeon: Danie Binder, MD;  Location: [facility];  Service: Endoscopy;  Laterality: N/A;  . BIOPSY  06/10/2015   Procedure: BIOPSY;  Surgeon: Danie Binder, MD;  [facility];  Service: Endoscopy;;  gastric biopsy  . CARDIAC CATHETERIZATION  05-11-2003  dr Shelva Majestic ([place] [facility])   Abnormal cardiolite/   Normal coronary arteries and normal LVF,  ef  63%  . CARDIOVASCULAR STRESS TEST   01-01-2014   normal lexiscan cardiolite/  no ischemia/ infarct/  normal LV function and wall motion ,  ef 81%  . COLONOSCOPY  last one 10/02/2011   MOD [place] [facility], IH, NEXT TCS APR 2018  . COLONOSCOPY WITH PROPOFOL N/A 02/26/2017   Procedure: COLONOSCOPY WITH PROPOFOL;  Surgeon: Danie Binder, MD;  [facility];  Service: Endoscopy;  Laterality: N/A;  11:00am  . CYSTO/   RIGHT URETEROSOCOPY LASER LITHOTRIPSY STONE EXTRACTION  10-13-2004  . CYSTO/  RIGHT RETROGRADE PYELOGRAM/  PLACMENT RIGHT URETERAL STENT  01-10-2010  . CYSTOSCOPY W/ URETERAL STENT PLACEMENT Right 08/19/2015   Procedure: CYSTOSCOPY WITH RETROGRADE PYELOGRAM/URETERAL STENT PLACEMENT;  Surgeon: Franchot Gallo, MD;  Location: [facility];  Service: Urology;  Laterality: Right;  . CYSTOSCOPY WITH RETROGRADE PYELOGRAM, URETEROSCOPY AND STENT PLACEMENT Left 10/21/2014   Procedure: CYSTOSCOPY WITH RETROGRADE PYELOGRAM, URETEROSCOPY AND STENT PLACEMENT;  Surgeon: Franchot Gallo, MD;  Location[facility];  Service: Urology;  Laterality: Left;  . CYSTOSCOPY WITH RETROGRADE PYELOGRAM, URETEROSCOPY AND STENT PLACEMENT Right 11/22/2015   Procedure: CYSTOSCOPY, RIGHT URETERAL STENT REMOVAL; RIGHT RETROGRADE PYELOGRAM, RIGHT URETEROSCOPY WITH BALLOON DILATION; RIGHT URETERAL STENT PLACEMENT;  Surgeon: Franchot Gallo, MD;  [facility]  [facility];  Service: Urology;  Laterality: Right;  . CYSTOSCOPY/RETROGRADE/URETEROSCOPY/STONE EXTRACTION WITH BASKET Bilateral 08/02/2015   Procedure: CYSTOSCOPY; BILATERAL RETROGRADE PYELOGRAMS; BILATERAL URETEROSCOPY, STONE EXTRACTION WITH BASKET;  Surgeon: Franchot Gallo, MD;  [facility];  Service: Urology;  Laterality: Bilateral;  . CYSTOSCOPY/RETROGRADE/URETEROSCOPY/STONE EXTRACTION WITH BASKET Right 06/28/2015   Procedure: CYSTOSCOPY/RETROGRADE/URETEROSCOPY/STONE EXTRACTION WITH BASKET, RIGHT URETERAL DOUBLE J STENT PLACEMENT;  Surgeon: Franchot Gallo, MD;  [facility];   Service: Urology;  Laterality: Right;  . ESOPHAGOGASTRODUODENOSCOPY (EGD) WITH PROPOFOL N/A 03/17/2013   Procedure: ESOPHAGOGASTRODUODENOSCOPY (EGD) WITH PROPOFOL;  Surgeon: Danie Binder, MD;  [facility];  Service: Endoscopy;  Laterality: N/A;  . ESOPHAGOGASTRODUODENOSCOPY (EGD) WITH PROPOFOL N/A 06/10/2015   Distal gastritis, distal esophageal stricture s/p dilation  . ESOPHAGOGASTRODUODENOSCOPY (EGD) WITH PROPOFOL N/A 02/26/2017   Procedure: ESOPHAGOGASTRODUODENOSCOPY (EGD) WITH PROPOFOL;  Surgeon: Danie Binder, MD;  [facility];  Service: Endoscopy;  Laterality: N/A;  . FLEXIBLE SIGMOIDOSCOPY  09/11/2011   [id number] Internal hemorrhoids  . HOLMIUM LASER APPLICATION Left 9/37/9024   Procedure: HOLMIUM LASER APPLICATION;  Surgeon: Franchot Gallo, MD;  [facility];  Service: Urology;  Laterality: Left;  . HOLMIUM LASER APPLICATION Right 0/97/3532   Procedure: HOLMIUM LASER APPLICATION;  Surgeon: Franchot Gallo, MD;  [facility];  Service: Urology;  Laterality: Right;  . MASS EXCISION Left 03/04/2015   Procedure: EXCISION OF SOFT TISSUE NEOPLASM LEFT ARM;  Surgeon: Aviva Signs, MD;  Location: [facility];  Service: General;  Laterality: Left;  . PERCUTANEOUS NEPHROSTOLITHOTOMY Bilateral 12/  2011     [facility]  . POLYPECTOMY N/A 03/17/2013   Procedure: GASTRIC POLYPECTOMY;  Surgeon: Danie Binder, MD;  [facility]: [facility];  Service: Endoscopy;  Laterality: N/A;  . POLYPECTOMY  02/26/2017   Procedure: POLYPECTOMY;  Surgeon: Danie Binder, MD;  [facility];  Service: Endoscopy;;  polyp at cecum, ascending colon polyps x3, hepatic flexure polyps x8, transverse colon polyps x8   . REMOVAL RIGHT THIGH CYST  2006  . SAVORY DILATION N/A 03/17/2013   Procedure: SAVORY DILATION;  Surgeon: Danie Binder, MD;  Location: [facility];  Service: Endoscopy;  Laterality: N/A;  #12.8, 14, 15, 16 dilators used  . SAVORY DILATION N/A 06/10/2015    Procedure: SAVORY DILATION;  Surgeon: Danie Binder, MD;  [facility];  Service: Endoscopy;  Laterality: N/A;  . TRANSTHORACIC ECHOCARDIOGRAM  01-01-2014   mild LVH/  ef 99-24%/  grade I diastolic dysfunction/  trivial MR, TR, and PR  . VAGINAL HYSTERECTOMY  1970's    There were no vitals filed for this visit.  Subjective Assessment - 10/03/17 1637    Subjective   S: I just let stuff pile up now.     Currently in Pain?  Yes    Pain Score  5     Pain Location  Abdomen    Pain Orientation  Lower    Pain Descriptors / Indicators  Aching    Pain Type  Chronic pain    Pain Radiating Towards  n/a    Pain Onset  More than a month ago    Pain Frequency  Intermittent    Aggravating Factors   any activity    Pain Relieving Factors  lying flat on back    Effect of Pain on Daily Activities  limited activity tolerance, mod effect    Multiple Pain Sites  No         [facility] OT Assessment -  10/03/17 1640      Assessment   Medical Diagnosis  Weakness and fatigue      Precautions   Precautions  Fall      Strength   Right Shoulder ABduction  4-/5 same as previous    Right Shoulder Internal Rotation  4-/5 same as previous    Right Shoulder External Rotation  4-/5 same as previous    Left Shoulder ABduction  4-/5 same as previous    Left Shoulder Internal Rotation  4-/5 same as previous    Left Shoulder External Rotation  4-/5 same as previous    Right Elbow Flexion  4-/5 same as previous    Right Elbow Extension  4-/5 same as previous    Left Elbow Flexion  4-/5 same as previous    Left Elbow Extension  4-/5 same as previous               OT Treatments/Exercises (OP) - 10/03/17 1638      ADLs   ADL Comments  Discussed energy conservation strategies for improved tolerance with ADLs. Discussed planning, prioritizing, and pacing herself during the day. Pt reports she sits a lot and watches tv. Pt reports washing dishes is hard, discussed sitting to wash, letting dishes  soak first, or using paper plates/utensils she can throw away. Went through various ADLs and discussed techniques for conserving energy.               OT Short Term Goals - 10/03/17 1640      OT SHORT TERM GOAL #1   Title  Pt will be provided with and educated on HEP to improve BUE strength and activity tolerance required for ADL completion.     Time  4    Period  Weeks    Status  Achieved      OT SHORT TERM GOAL #2   Title  Pt will be educated on and actively implement energy conservation strategies to improve activity tolerance and independence in ADL completion.     Time  4    Period  Weeks    Status  Partially Met      OT SHORT TERM GOAL #3   Title  Pt will improve BUE strength to 4+/5 to increase ability to perform meal preparation tasks including carrying groceries and lifting pots and pans.     Time  4    Period  Weeks    Status  Not Met      OT SHORT TERM GOAL #4   Title  Pt will be educated on compensatory strategies and techniques for ADL completion to improve independence and daily activity tolerance.     Time  4    Period  Weeks    Status  Partially Met      OT SHORT TERM GOAL #5   Title  Pt will be educated on cognitive strategies to compensate for memory issues.     Time  4    Period  Weeks    Status  Partially Met               Plan - 10/03/17 1642    Clinical Impression Statement  A: Reassessment completed this session, pt has met 1/5 STGs and partially met 3/5 STGs. Pt has been educated on memory strategies and energy conservation strategies, however is not motivated to implement strategies at home. Pt did not attempt to use any memory strategies from prior session. Pt has assistance from family occasionally and  is able to complete ADLs independently with increased time. At this time pt's main concern is BLE strength and balance to enable her to walk greater distances. Discussed OT sessions and importance of completing discussed strategies at  home, pt is agreeable to discharge from OT at this time.     Plan  P: Discharge pt       Patient will benefit from skilled therapeutic intervention in order to improve the following deficits and impairments:  Decreased endurance, Decreased activity tolerance, Decreased strength, Impaired UE functional use, Decreased safety awareness  Visit Diagnosis: Other symptoms and signs involving the musculoskeletal system  Muscle weakness (generalized)    Problem List Patient Active Problem List   Diagnosis Date Noted  . Multiple myeloma ([place]) 03/15/2017  . Urinary frequency 02/28/2017  . SIRS (systemic inflammatory response syndrome) ([place]) 02/26/2017  . 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 Morbid obesity ([place]) 10/14/2016  . Vaginal dryness, menopausal 10/14/2016  . Neuropathic pain of both feet 04/11/2016  . TIA (transient ischemic attack) 02/26/2016  . Lactose intolerance in adult 02/01/2016  . Brain TIA 08/19/2015  . CKD (chronic kidney disease) stage 3, GFR 30-59 ml/min ([facility]) 07/12/2015  . Lipoma of arm 01/19/2015  . Anemia 10/14/2014  . Lumbar back pain with radiculopathy affecting left lower extremity 05/11/2014  . Nocturnal hypoxia 02/04/2014  . Generalized anxiety disorder 05/20/2013  . Anxiety and depression 08/13/2012  . Abnormal brain scan 02/07/2012  . Thoracic or lumbosacral neuritis or radiculitis, unspecified 12/22/2010  . Hypercalcemia 11/30/2010  . Colon adenomas 05/11/2009  . HEMORRHOIDS 03/15/2009  . GERD 03/15/2009  . FATTY LIVER DISEASE 03/15/2009  . RENAL CALCULUS 03/15/2009  . Sleep apnea 09/15/2008  . Type 2 diabetes with nephropathy ([place]) 02/02/2008  . Hyperlipidemia LDL goal <100 09/17/2007  . Hypertension goal BP (blood pressure) < 130/80 09/17/2007   Guadelupe Sabin, OTR/L  [phone] 10/03/2017, 4:46 PM  [place], [place], [address] Phone: [phone]   Fax:   [phone]  Name: Cheryl Abbott MRN: [id number] Date of Birth: Jun 11, 1946    OCCUPATIONAL THERAPY DISCHARGE SUMMARY  Visits from Start of Care: 3  Current functional level related to goals / functional outcomes: Pt has met 1 goal and has partially met 3 goals, pt has been educated on energy conservation and memory strategies, is not attempting to implement at home.    Remaining deficits: Fatigue, weakness, poor activity tolerance   Education / Equipment: Strategies for memory management and energy conservation Plan: Patient agrees to discharge.  Patient goals were partially met. Patient is being discharged due to being pleased with the current functional level.  ?????

## 2017-10-07 DIAGNOSIS — M25579 Pain in unspecified ankle and joints of unspecified foot: Secondary | ICD-10-CM | POA: Diagnosis not present

## 2017-10-07 DIAGNOSIS — M79672 Pain in left foot: Secondary | ICD-10-CM | POA: Diagnosis not present

## 2017-10-07 DIAGNOSIS — M79671 Pain in right foot: Secondary | ICD-10-CM | POA: Diagnosis not present

## 2017-10-08 ENCOUNTER — Encounter (HOSPITAL_COMMUNITY): Payer: Medicare Other

## 2017-10-08 ENCOUNTER — Encounter (HOSPITAL_COMMUNITY): Payer: Medicare Other | Admitting: Occupational Therapy

## 2017-10-08 ENCOUNTER — Encounter (HOSPITAL_COMMUNITY): Payer: Self-pay | Admitting: Physical Therapy

## 2017-10-08 ENCOUNTER — Telehealth (HOSPITAL_COMMUNITY): Payer: Self-pay | Admitting: Occupational Therapy

## 2017-10-08 ENCOUNTER — Ambulatory Visit (HOSPITAL_COMMUNITY): Payer: Medicare Other | Admitting: Physical Therapy

## 2017-10-08 ENCOUNTER — Encounter

## 2017-10-08 DIAGNOSIS — R296 Repeated falls: Secondary | ICD-10-CM | POA: Diagnosis not present

## 2017-10-08 DIAGNOSIS — R29898 Other symptoms and signs involving the musculoskeletal system: Secondary | ICD-10-CM | POA: Diagnosis not present

## 2017-10-08 DIAGNOSIS — M6281 Muscle weakness (generalized): Secondary | ICD-10-CM | POA: Diagnosis not present

## 2017-10-08 DIAGNOSIS — R262 Difficulty in walking, not elsewhere classified: Secondary | ICD-10-CM

## 2017-10-08 NOTE — Therapy (Signed)
[place], [place], [address] Phone: [phone]   Fax:  [phone]  Physical Therapy Treatment/Discharge  Patient Details  Name: SREEJA SPIES MRN: [id number] Date of Birth: [birth year] Referring Provider: Faythe Casa   Encounter Date: 10/08/2017  PT End of Session - 10/08/17 1306    Visit Number  11    Number of Visits  121   Date for PT Re-Evaluation  10/09/17 minireassess complete 09/19/17    Authorization Type  medicare    Authorization Time Period  03/20-->10/09/17    Authorization - Visit Number  11    Authorization - Number of Visits  11    Activity Tolerance  Patient tolerated treatment well;Patient limited by pain;Patient limited by fatigue;Patient limited by lethargy    Behavior During Therapy  [facility] for tasks assessed/performed       Past Medical History:  Diagnosis Date  . Anemia   . Anxiety   . Anxiety disorder   . Arthritis   . Cancer ([place])   . 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 Chronic back pain   . Depression   . Fatty liver   . GERD (gastroesophageal reflux disease)   . Headache   . History of adenomatous polyp of colon    2008  . History of kidney stones   . Hypercalcemia   . Hyperlipidemia   . Hypertension   . Left ureteral stone   . Lipoma of arm 01/19/2015  . Lumbar disc disease with radiculopathy   . Nephrolithiasis   . Neuropathy, peripheral   . OSA treated with BiPAP    per study 2007  . Sciatica   . Sigmoid diverticulosis   . Type 2 diabetes mellitus ([place])     Past Surgical History:  Procedure Laterality Date  . BIOPSY N/A 03/17/2013   Procedure: GASTRIC BIOPSIES;  Surgeon: Danie Binder, MD;  Location: [facility];  Service: Endoscopy;  Laterality: N/A;  . BIOPSY  06/10/2015   Procedure: BIOPSY;  Surgeon: Danie Binder, MD;  [facility];  Service: Endoscopy;;  gastric biopsy  . CARDIAC CATHETERIZATION  05-11-2003  dr Shelva Majestic ([place]  [facility])   Abnormal cardiolite/   Normal coronary arteries and normal LVF,  ef  63%  . CARDIOVASCULAR STRESS TEST  01-01-2014   normal lexiscan cardiolite/  no ischemia/ infarct/  normal LV function and wall motion ,  ef 81%  . COLONOSCOPY  last one 10/02/2011   MOD [place] TICS, IH, NEXT TCS APR 2018  . COLONOSCOPY WITH PROPOFOL N/A 02/26/2017   Procedure: COLONOSCOPY WITH PROPOFOL;  Surgeon: Danie Binder, MD;  [facility];  Service: Endoscopy;  Laterality: N/A;  11:00am  . CYSTO/   RIGHT URETEROSOCOPY LASER LITHOTRIPSY STONE EXTRACTION  10-13-2004  . CYSTO/  RIGHT RETROGRADE PYELOGRAM/  PLACMENT RIGHT URETERAL STENT  01-10-2010  . CYSTOSCOPY W/ URETERAL STENT PLACEMENT Right 08/19/2015   Procedure: CYSTOSCOPY WITH RETROGRADE PYELOGRAM/URETERAL STENT PLACEMENT;  Surgeon: Franchot Gallo, MD;  Location: [facility];  Service: Urology;  Laterality: Right;  . CYSTOSCOPY WITH RETROGRADE PYELOGRAM, URETEROSCOPY AND STENT PLACEMENT Left 10/21/2014   Procedure: CYSTOSCOPY WITH RETROGRADE PYELOGRAM, URETEROSCOPY AND STENT PLACEMENT;  Surgeon: Franchot Gallo, MD;  [facility];  Service: Urology;  Laterality: Left;  . CYSTOSCOPY WITH RETROGRADE PYELOGRAM, URETEROSCOPY AND STENT PLACEMENT Right 11/22/2015   Procedure: CYSTOSCOPY, RIGHT URETERAL STENT REMOVAL; RIGHT RETROGRADE PYELOGRAM, RIGHT URETEROSCOPY WITH BALLOON DILATION; RIGHT URETERAL STENT  PLACEMENT;  Surgeon: Franchot Gallo, MD;  [facility];  Service: Urology;  Laterality: Right;  . CYSTOSCOPY/RETROGRADE/URETEROSCOPY/STONE EXTRACTION WITH BASKET Bilateral 08/02/2015   Procedure: CYSTOSCOPY; BILATERAL RETROGRADE PYELOGRAMS; BILATERAL URETEROSCOPY, STONE EXTRACTION WITH BASKET;  Surgeon: Franchot Gallo, MD;  [facility];  Service: Urology;  Laterality: Bilateral;  . CYSTOSCOPY/RETROGRADE/URETEROSCOPY/STONE EXTRACTION WITH BASKET Right 06/28/2015   Procedure: CYSTOSCOPY/RETROGRADE/URETEROSCOPY/STONE  EXTRACTION WITH BASKET, RIGHT URETERAL DOUBLE J STENT PLACEMENT;  Surgeon: Franchot Gallo, MD;  [facility];  Service: Urology;  Laterality: Right;  . ESOPHAGOGASTRODUODENOSCOPY (EGD) WITH PROPOFOL N/A 03/17/2013   Procedure: ESOPHAGOGASTRODUODENOSCOPY (EGD) WITH PROPOFOL;  Surgeon: Danie Binder, MD;  [facility];  Service: Endoscopy;  Laterality: N/A;  . ESOPHAGOGASTRODUODENOSCOPY (EGD) WITH PROPOFOL N/A 06/10/2015   Distal gastritis, distal esophageal stricture s/p dilation  . ESOPHAGOGASTRODUODENOSCOPY (EGD) WITH PROPOFOL N/A 02/26/2017   Procedure: ESOPHAGOGASTRODUODENOSCOPY (EGD) WITH PROPOFOL;  Surgeon: Danie Binder, MD;  [facility];  Service: Endoscopy;  Laterality: N/A;  . FLEXIBLE SIGMOIDOSCOPY  09/11/2011   [id number] Internal hemorrhoids  . HOLMIUM LASER APPLICATION Left 4/58/0998   Procedure: HOLMIUM LASER APPLICATION;  Surgeon: Franchot Gallo, MD;  Location: [facility];  Service: Urology;  Laterality: Left;  . HOLMIUM LASER APPLICATION Right 3/38/2505   Procedure: HOLMIUM LASER APPLICATION;  Surgeon: Franchot Gallo, MD;  [facility];  Service: Urology;  Laterality: Right;  . MASS EXCISION Left 03/04/2015   Procedure: EXCISION OF SOFT TISSUE NEOPLASM LEFT ARM;  Surgeon: Aviva Signs, MD;  Location: [facility];  Service: General;  Laterality: Left;  . PERCUTANEOUS NEPHROSTOLITHOTOMY Bilateral 12/  2011     [facility]  . POLYPECTOMY N/A 03/17/2013   Procedure: GASTRIC POLYPECTOMY;  Surgeon: Danie Binder, MD;  [facility];  Service: Endoscopy;  Laterality: N/A;  . POLYPECTOMY  02/26/2017   Procedure: POLYPECTOMY;  Surgeon: Danie Binder, MD;  [facility];  Service: Endoscopy;;  polyp at cecum, ascending colon polyps x3, hepatic flexure polyps x8, transverse colon polyps x8   . REMOVAL RIGHT THIGH CYST  2006  . SAVORY DILATION N/A 03/17/2013   Procedure: SAVORY DILATION;  Surgeon: Danie Binder, MD;  [facility];   Service: Endoscopy;  Laterality: N/A;  #12.8, 14, 15, 16 dilators used  . SAVORY DILATION N/A 06/10/2015   Procedure: SAVORY DILATION;  Surgeon: Danie Binder, MD;  [facility];  Service: Endoscopy;  Laterality: N/A;  . TRANSTHORACIC ECHOCARDIOGRAM  01-01-2014   mild LVH/  ef 39-76%/  grade I diastolic dysfunction/  trivial MR, TR, and PR  . VAGINAL HYSTERECTOMY  1970's    There were no vitals filed for this visit.   Subjective:  PT states that she is not doing her exercises.  Pain:  Everywhere:  Majority ankles and knees 7/10 chronic takes meds to help    [facility] PT Assessment - 10/08/17 0001      Assessment   Medical Diagnosis  Weakness and fatigue    Onset Date/Surgical Date  07/12/17    Next MD Visit  June, 2019    Prior Therapy  last year       Precautions   Precautions  Fall      Observation/Other Assessments   Focus on Therapeutic Outcomes (FOTO)   65 % limited was 55% limitation      Functional Tests   Functional tests  Single leg stance;Sit to Stand      Single Leg Stance   Comments  Rt  10", Lt 8" max of 5 was Rt 9" ; Lt 6"       Sit to Stand   Comments  5 STS in  18.65;  on 4/11 was 23" no HHA was 27.75 hands on knees      Strength   Right Hip Flexion  3/5 was 3/5 on 4/11 at eval was  2/5    Right Hip Extension  3/5 was 3/5 on 4/11 eval was  2/5    Right Hip ABduction  3/5 was 3/5    Left Hip Flexion  4-/5 was 4-/5 on 4/11 at eval was  3+/5    Left Hip Extension  3/5 was 3/5    Left Hip ABduction  3+/5 was 3/5    Right Knee Flexion  4+/5 was 4-/5    Right Knee Extension  5/5 on 4/11 was 4/5 ; eval 3+/5    Left Knee Flexion  4/5 was 3+/5    Left Knee Extension  5/5 eval  4/5; 4/11 was  4+/5    Right Ankle Dorsiflexion  3+/5 was 3+/5 ; pt gives way    Right Ankle Plantar Flexion  3/5 was 3/5    Left Ankle Dorsiflexion  4-/5 was 3+/5on 4/11       Ambulation/Gait   Ambulation Distance (Feet)  556 Feet    Assistive device  None    Gait Pattern   Decreased step length - right;Decreased step length - left    Gait velocity  --    Gait Comments  6 minute walk                    [facility] Adult PT Treatment/Exercise - 10/08/17 0001      Exercises   Exercises  Knee/Hip      Knee/Hip Exercises: Standing   Heel Raises  Both;15 reps    Functional Squat  10 reps      Knee/Hip Exercises: Seated   Sit to Sand  10 reps      Knee/Hip Exercises: Sidelying   Hip ABduction  10 reps      Knee/Hip Exercises: Prone   Hip Extension  Both;10 reps             PT Education - 10/08/17 1354    Education provided  Yes    Education Details  Encouraged pt to complete HEP on a daily basis.     Person(s) Educated  Patient    Methods  Explanation    Comprehension  Verbalized understanding       PT Short Term Goals - 10/08/17 1334      PT SHORT TERM GOAL #1   Title  Pt to be able to single leg stance with both Le for 15 seconds to reduce the risk of falling.     Status  Not Met      PT SHORT TERM GOAL #2   Title  Pt to be able to ambulate for six minute without resting to show progression in community ambulation.     Baseline  4/11: ambulated _0 , did require 2 standing rest breaks during testing     Status  On-going      PT SHORT TERM GOAL #3   Title  Pt to be able to complet five sit to stand in 20 seconds or less to demonstrate increased power of LE for funtional activity such as sit to stand, arising from a squatted position.     Baseline  4/11: 5  STS 23" no HHA    Status  Achieved        PT Long Term Goals - 10/08/17 1335      PT LONG TERM GOAL #1   Title  Pt to be able ot single leg stance on both LE for 25 seconds to reduce risk of falling     Time  6    Period  Weeks    Status  Not Met      PT LONG TERM GOAL #2   Title  Pt to be abl eot ambulate for 15 minutes without resting to become a Hydrographic surveyor.     Time  6    Period  Weeks    Status  Not Met      PT LONG TERM GOAL #3   Title  Pt to be  able to complet 5 sit to stand in 15 seconds to demonstrate improve power of B LE to arise from lower level objects such as a car.    Time  6    Period  Weeks    Status  Not Met      PT LONG TERM GOAL #4   Title  Pt B LE mm grade to increase by one level to allow pt to step  up onto a curb with confidence.     Time  6    Period  Weeks    Status  Not Met            Plan - 10/08/17 1355    Clinical Impression Statement  Pt reassessed.  She has made fair gains towards her goals but admits to not doing her exercises at home like she should.  PT is not walking either.  Therapist explained that it need to be the patient who does the work if she wants to acquire more strength and energy.  Pt verbalized understanding.     Rehab Potential  Fair    PT Frequency  2x / week    PT Duration  6 weeks    PT Treatment/Interventions  Gait training;Functional mobility training;Therapeutic activities;Therapeutic exercise;Balance training;Neuromuscular re-education;Patient/family education    PT Next Visit Plan  discharge to HEP     PT Home Exercise Plan  eval:   heel raises, squats, sit to stand, SLR, bridge and  sitting ankle Dorsi/plantar flexion .         Patient will benefit from skilled therapeutic intervention in order to improve the following deficits and impairments:  Abnormal gait, Decreased activity tolerance, Decreased balance, Decreased mobility, Decreased strength, Difficulty walking, Impaired perceived functional ability, Obesity, Pain  Visit Diagnosis: Muscle weakness (generalized)  Difficulty in walking, not elsewhere classified  Repeated falls     Problem List Patient Active Problem List   Diagnosis Date Noted  . Multiple myeloma ([place]) 03/15/2017  . Urinary frequency 02/28/2017  . SIRS (systemic inflammatory response syndrome) ([place]) 02/26/2017  . 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 Morbid obesity ([place]) 10/14/2016  . Vaginal dryness, menopausal 10/14/2016  .  Neuropathic pain of both feet 04/11/2016  . TIA (transient ischemic attack) 02/26/2016  . Lactose intolerance in adult 02/01/2016  . Brain TIA 08/19/2015  . CKD (chronic kidney disease) stage 3, GFR 30-59 ml/min ([facility]) 07/12/2015  . Lipoma of arm 01/19/2015  . Anemia 10/14/2014  . Lumbar back pain with radiculopathy affecting left lower extremity 05/11/2014  . Nocturnal hypoxia 02/04/2014  . Generalized anxiety disorder 05/20/2013  . Anxiety and depression 08/13/2012  . Abnormal brain scan  02/07/2012  . Thoracic or lumbosacral neuritis or radiculitis, unspecified 12/22/2010  . Hypercalcemia 11/30/2010  . Colon adenomas 05/11/2009  . HEMORRHOIDS 03/15/2009  . GERD 03/15/2009  . FATTY LIVER DISEASE 03/15/2009  . RENAL CALCULUS 03/15/2009  . Sleep apnea 09/15/2008  . 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 Hyperlipidemia LDL goal <100 09/17/2007  . Hypertension goal BP (blood pressure) < 130/80 09/17/2007    Rayetta Humphrey, PT CLT [phone] 10/08/2017, 1:57 PM  [place], [place], [address] Phone: [phone]   Fax:  [phone]  Name: MORINE KOHLMAN MRN: [id number] Date of Birth: April 24, 1946   PHYSICAL THERAPY DISCHARGE SUMMARY  Visits from Start of Care: 11  Current functional level related to goals / functional outcomes: See above    Remaining deficits: See above   Education / Equipment: HEP  Plan: Patient agrees to discharge.  Patient goals were partially met. Patient is being discharged due to lack of progress.  ?????        Rayetta Humphrey, [place] CLT [phone]

## 2017-10-08 NOTE — Telephone Encounter (Signed)
pt was d/c from OT and not taken off the OT Waitlist. NF Magda Paganini just told me as I was leaving pt a mesage that we were able to get her in. Now this is cx since it's not needed.and remove from waitlist.

## 2017-10-08 NOTE — Patient Instructions (Addendum)
Heel Raise: Bilateral (Standing)   At the kitchen counter Rise on balls of feet. Repeat _15___ times per set. Do __1__ sets per session. Do __2__ sessions per day.  http://orth.exer.us/38   Copyright  VHI. All rights reserved.  Functional Quadriceps: Chair Squat    Keeping feet flat on floor, shoulder width apart, squat as low as is comfortable. Use support as necessary. Repeat __15__ times per set. Do __1__ sets per session. Do __2__ sessions per day.  http://orth.exer.us/736   Copyright  VHI. All rights reserved.  Strengthening: Hip Abduction (Side-Lying)    Tighten muscles on front of left thigh, then lift leg 15____ inches from surface, keeping knee locked.  Repeat __15__ times per set. Do __1__ sets per session. Do _2___ sessions per day.  http://orth.exer.us/622   Copyright  VHI. All rights reserved.  Straight Leg Raise (Prone)    Abdomen and head supported, keep left knee locked and raise leg at hip. Avoid arching low back. Repeat __10__ times per set. Do __1__ sets per session. Do ___2_ sessions per day.  http://orth.exer.us/1112   Copyright  VHI. All rights reserved.    WAlk for 6 minutes straight

## 2017-10-10 ENCOUNTER — Ambulatory Visit (HOSPITAL_COMMUNITY): Payer: Medicare Other | Admitting: Physical Therapy

## 2017-10-17 DIAGNOSIS — R0902 Hypoxemia: Secondary | ICD-10-CM | POA: Diagnosis not present

## 2017-10-20 ENCOUNTER — Other Ambulatory Visit: Payer: Self-pay | Admitting: Family Medicine

## 2017-10-23 ENCOUNTER — Other Ambulatory Visit: Payer: Self-pay | Admitting: Family Medicine

## 2017-10-24 ENCOUNTER — Emergency Department (HOSPITAL_COMMUNITY): Payer: Medicare Other

## 2017-10-24 ENCOUNTER — Emergency Department (HOSPITAL_COMMUNITY)
Admission: EM | Admit: 2017-10-24 | Discharge: 2017-10-25 | Disposition: A | Discharge status: Home / Self Care | Payer: Medicare Other | Attending: Emergency Medicine | Admitting: Emergency Medicine

## 2017-10-24 ENCOUNTER — Other Ambulatory Visit: Payer: Self-pay

## 2017-10-24 ENCOUNTER — Encounter (HOSPITAL_COMMUNITY): Payer: Self-pay | Admitting: Emergency Medicine

## 2017-10-24 DIAGNOSIS — I129 Hypertensive chronic kidney disease with stage 1 through stage 4 chronic kidney disease, or unspecified chronic kidney disease: Secondary | ICD-10-CM | POA: Diagnosis not present

## 2017-10-24 DIAGNOSIS — N202 Calculus of kidney with calculus of ureter: Secondary | ICD-10-CM | POA: Diagnosis not present

## 2017-10-24 DIAGNOSIS — N183 Chronic kidney disease, stage 3 (moderate): Secondary | ICD-10-CM | POA: Diagnosis not present

## 2017-10-24 DIAGNOSIS — E1122 Type 2 diabetes mellitus with diabetic chronic kidney disease: Secondary | ICD-10-CM | POA: Insufficient documentation

## 2017-10-24 DIAGNOSIS — R140 Abdominal distension (gaseous): Secondary | ICD-10-CM

## 2017-10-24 DIAGNOSIS — C9001 Multiple myeloma in remission: Secondary | ICD-10-CM | POA: Insufficient documentation

## 2017-10-24 DIAGNOSIS — D1722 Benign lipomatous neoplasm of skin and subcutaneous tissue of left arm: Secondary | ICD-10-CM | POA: Insufficient documentation

## 2017-10-24 DIAGNOSIS — Z87891 Personal history of nicotine dependence: Secondary | ICD-10-CM | POA: Diagnosis not present

## 2017-10-24 DIAGNOSIS — N390 Urinary tract infection, site not specified: Secondary | ICD-10-CM | POA: Diagnosis not present

## 2017-10-24 DIAGNOSIS — K5900 Constipation, unspecified: Secondary | ICD-10-CM | POA: Insufficient documentation

## 2017-10-24 LAB — URINALYSIS, ROUTINE W REFLEX MICROSCOPIC
Bilirubin Urine: NEGATIVE
Glucose, UA: NEGATIVE mg/dL
Ketones, ur: NEGATIVE mg/dL
Nitrite: NEGATIVE
Protein, ur: 30 mg/dL — AB
Specific Gravity, Urine: 1.009 (ref 1.005–1.030)
WBC, UA: >50 WBC/hpf — ABNORMAL HIGH (ref 0–5)
pH: 7 (ref 5.0–8.0)

## 2017-10-24 LAB — COMPREHENSIVE METABOLIC PANEL
ALT: 23 U/L (ref 14–54)
AST: 25 U/L (ref 15–41)
Albumin: 4 g/dL (ref 3.5–5.0)
Alkaline Phosphatase: 103 U/L (ref 38–126)
Anion gap: 10 (ref 5–15)
BUN: 17 mg/dL (ref 6–20)
CO2: 27 mmol/L (ref 22–32)
Calcium: 10.1 mg/dL (ref 8.9–10.3)
Chloride: 99 mmol/L — ABNORMAL LOW (ref 101–111)
Creatinine, Ser: 1.59 mg/dL — ABNORMAL HIGH (ref 0.44–1.00)
GFR calc Af Amer: 36 mL/min — ABNORMAL LOW (ref >60)
GFR calc non Af Amer: 31 mL/min — ABNORMAL LOW (ref >60)
Glucose, Bld: 254 mg/dL — ABNORMAL HIGH (ref 65–99)
Potassium: 3.3 mmol/L — ABNORMAL LOW (ref 3.5–5.1)
Sodium: 136 mmol/L (ref 135–145)
Total Bilirubin: 0.4 mg/dL (ref 0.3–1.2)
Total Protein: 8.9 g/dL — ABNORMAL HIGH (ref 6.5–8.1)

## 2017-10-24 LAB — CBC
HCT: 41.8 % (ref 36.0–46.0)
Hemoglobin: 13.6 g/dL (ref 12.0–15.0)
MCH: 32.2 pg (ref 26.0–34.0)
MCHC: 32.5 g/dL (ref 30.0–36.0)
MCV: 98.8 fL (ref 78.0–100.0)
Platelets: 315 10*3/uL (ref 150–400)
RBC: 4.23 MIL/uL (ref 3.87–5.11)
RDW: 14.3 % (ref 11.5–15.5)
WBC: 8.1 10*3/uL (ref 4.0–10.5)

## 2017-10-24 LAB — LIPASE, BLOOD: Lipase: 42 U/L (ref 11–51)

## 2017-10-24 MED ORDER — RANITIDINE HCL 150 MG PO TABS: 150.0000 mg | ORAL_TABLET | Freq: Two times a day (BID) | ORAL | 0 refills | Status: DC

## 2017-10-24 MED ORDER — ONDANSETRON HCL 4 MG PO TABS: 4.0000 mg | ORAL_TABLET | Freq: Three times a day (TID) | ORAL | 0 refills | Status: DC | PRN

## 2017-10-24 MED ORDER — ALPRAZOLAM 0.5 MG PO TABS
0.5000 mg | ORAL_TABLET | Freq: Once | ORAL | Status: AC
  Administered 2017-10-25: 0.5 mg via ORAL
  Filled 2017-10-24: qty 1

## 2017-10-24 MED ORDER — ALPRAZOLAM 0.5 MG PO TABS: 0.5000 mg | ORAL_TABLET | Freq: Every evening | ORAL | 0 refills | Status: DC | PRN

## 2017-10-24 MED ORDER — CIPROFLOXACIN HCL 500 MG PO TABS: 500.0000 mg | ORAL_TABLET | Freq: Two times a day (BID) | ORAL | 0 refills | Status: DC

## 2017-10-24 MED ORDER — CIPROFLOXACIN HCL 250 MG PO TABS
500.0000 mg | ORAL_TABLET | Freq: Once | ORAL | Status: AC
  Administered 2017-10-25: 500 mg via ORAL
  Filled 2017-10-24: qty 2

## 2017-10-24 NOTE — ED Triage Notes (Signed)
Pt states abd bloating with nausea when eating for one week. Pt drinking cola in triage

## 2017-10-24 NOTE — ED Notes (Signed)
IV attempted as it was needed for the CT with contrast. Patient was very afraid of needles and jerked with attempt causing me to miss the vein.

## 2017-10-24 NOTE — ED Provider Notes (Signed)
Emergency Department Provider Note   I have reviewed the triage vital signs and the nursing notes.   HISTORY  Chief Complaint Abdominal Pain   HPI Cheryl Abbott is a 72 y.o. female with multiple medical problems as documented below the presents to the emergency department today secondary to nausea and abdominal bloating.  Patient states is worse in the mornings when she wakes up and seems to get a little better throughout the day however she had progressively worsening distention for the last couple months.  She states is been worsening since she got her last colonoscopy but she is unsure when that was.  She has had somewhat of a decreased appetite but no fevers.  She actually has not vomited.  She does have constipation once in a while but nothing is worse than normal recently.  She states that she is concerned she has some type of cancer is causing an her symptoms.  She does not have any history of cancer. No other associated or modifying symptoms.    Past Medical History:  Diagnosis Date  . Anemia   . Anxiety   . Anxiety disorder   . Arthritis   . Cancer ([place])   . 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 Chronic back pain   . Depression   . Fatty liver   . GERD (gastroesophageal reflux disease)   . Headache   . History of adenomatous polyp of colon    2008  . History of kidney stones   . Hypercalcemia   . Hyperlipidemia   . Hypertension   . Left ureteral stone   . Lipoma of arm 01/19/2015  . Lumbar disc disease with radiculopathy   . Nephrolithiasis   . Neuropathy, peripheral   . OSA treated with BiPAP    per study 2007  . Sciatica   . Sigmoid diverticulosis   . Type 2 diabetes mellitus [facility])     Patient Active Problem List   Diagnosis Date Noted  . Multiple myeloma ([place]) 03/15/2017  . Urinary frequency 02/28/2017  . SIRS (systemic inflammatory response syndrome) ([place]) 02/26/2017  . 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 Morbid obesity ([place])  10/14/2016  . Vaginal dryness, menopausal 10/14/2016  . Neuropathic pain of both feet 04/11/2016  . TIA (transient ischemic attack) 02/26/2016  . Lactose intolerance in adult 02/01/2016  . Brain TIA 08/19/2015  . CKD (chronic kidney disease) stage 3, GFR 30-59 ml/min (HCC) 07/12/2015  . Lipoma of arm 01/19/2015  . Anemia 10/14/2014  . Lumbar back pain with radiculopathy affecting left lower extremity 05/11/2014  . Nocturnal hypoxia 02/04/2014  . Generalized anxiety disorder 05/20/2013  . Anxiety and depression 08/13/2012  . Abnormal brain scan 02/07/2012  . Thoracic or lumbosacral neuritis or radiculitis, unspecified 12/22/2010  . Hypercalcemia 11/30/2010  . Colon adenomas 05/11/2009  . HEMORRHOIDS 03/15/2009  . GERD 03/15/2009  . FATTY LIVER DISEASE 03/15/2009  . RENAL CALCULUS 03/15/2009  . Sleep apnea 09/15/2008  . 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 Hyperlipidemia LDL goal <100 09/17/2007  . Hypertension goal BP (blood pressure) < 130/80 09/17/2007    Past Surgical History:  Procedure Laterality Date  . BIOPSY N/A 03/17/2013   Procedure: GASTRIC BIOPSIES;  Surgeon: Danie Binder, MD;  Location: [facility];  Service: Endoscopy;  Laterality: N/A;  . BIOPSY  06/10/2015   Procedure: BIOPSY;  Surgeon: Danie Binder, MD;  [facility];  Service: Endoscopy;;  gastric biopsy  . CARDIAC CATHETERIZATION  05-11-2003  dr Shelva Majestic ([facility])   Abnormal cardiolite/   Normal coronary arteries and normal LVF,  ef  63%  . CARDIOVASCULAR STRESS TEST  01-01-2014   normal lexiscan cardiolite/  no ischemia/ infarct/  normal LV function and wall motion ,  ef 81%  . COLONOSCOPY  last one 10/02/2011   MOD [place] TICS, IH, NEXT TCS APR 2018  . COLONOSCOPY WITH PROPOFOL N/A 02/26/2017   Procedure: COLONOSCOPY WITH PROPOFOL;  Surgeon: Danie Binder, MD;  [facility];  Service: Endoscopy;  Laterality: N/A;  11:00am  . CYSTO/   RIGHT URETEROSOCOPY  LASER LITHOTRIPSY STONE EXTRACTION  10-13-2004  . CYSTO/  RIGHT RETROGRADE PYELOGRAM/  PLACMENT RIGHT URETERAL STENT  01-10-2010  . CYSTOSCOPY W/ URETERAL STENT PLACEMENT Right 08/19/2015   Procedure: CYSTOSCOPY WITH RETROGRADE PYELOGRAM/URETERAL STENT PLACEMENT;  Surgeon: Franchot Gallo, MD;  Location: [facility];  Service: Urology;  Laterality: Right;  . CYSTOSCOPY WITH RETROGRADE PYELOGRAM, URETEROSCOPY AND STENT PLACEMENT Left 10/21/2014   Procedure: CYSTOSCOPY WITH RETROGRADE PYELOGRAM, URETEROSCOPY AND STENT PLACEMENT;  Surgeon: Franchot Gallo, MD;  Location[facility];  Service: Urology;  Laterality: Left;  . CYSTOSCOPY WITH RETROGRADE PYELOGRAM, URETEROSCOPY AND STENT PLACEMENT Right 11/22/2015   Procedure: CYSTOSCOPY, RIGHT URETERAL STENT REMOVAL; RIGHT RETROGRADE PYELOGRAM, RIGHT URETEROSCOPY WITH BALLOON DILATION; RIGHT URETERAL STENT PLACEMENT;  Surgeon: Franchot Gallo, MD;  Location[facility];  Service: Urology;  Laterality: Right;  . CYSTOSCOPY/RETROGRADE/URETEROSCOPY/STONE EXTRACTION WITH BASKET Bilateral 08/02/2015   Procedure: CYSTOSCOPY; BILATERAL RETROGRADE PYELOGRAMS; BILATERAL URETEROSCOPY, STONE EXTRACTION WITH BASKET;  Surgeon: Franchot Gallo, MD;  Location: [facility];  Service: Urology;  Laterality: Bilateral;  . CYSTOSCOPY/RETROGRADE/URETEROSCOPY/STONE EXTRACTION WITH BASKET Right 06/28/2015   Procedure: CYSTOSCOPY/RETROGRADE/URETEROSCOPY/STONE EXTRACTION WITH BASKET, RIGHT URETERAL DOUBLE J STENT PLACEMENT;  Surgeon: Franchot Gallo, MD;  [facility];  Service: Urology;  Laterality: Right;  . ESOPHAGOGASTRODUODENOSCOPY (EGD) WITH PROPOFOL N/A 03/17/2013   Procedure: ESOPHAGOGASTRODUODENOSCOPY (EGD) WITH PROPOFOL;  Surgeon: Danie Binder, MD;  [facility];  Service: Endoscopy;  Laterality: N/A;  . ESOPHAGOGASTRODUODENOSCOPY (EGD) WITH PROPOFOL N/A 06/10/2015   Distal gastritis, distal esophageal stricture s/p dilation  . ESOPHAGOGASTRODUODENOSCOPY  (EGD) WITH PROPOFOL N/A 02/26/2017   Procedure: ESOPHAGOGASTRODUODENOSCOPY (EGD) WITH PROPOFOL;  Surgeon: Danie Binder, MD;  [facility];  Service: Endoscopy;  Laterality: N/A;  . FLEXIBLE SIGMOIDOSCOPY  09/11/2011   BOF:BPZWCHEN Internal hemorrhoids  . HOLMIUM LASER APPLICATION Left 2/77/8242   Procedure: HOLMIUM LASER APPLICATION;  Surgeon: Franchot Gallo, MD;  Location[facility];  Service: Urology;  Laterality: Left;  . HOLMIUM LASER APPLICATION Right 3/53/6144   Procedure: HOLMIUM LASER APPLICATION;  Surgeon: Franchot Gallo, MD;  Location[facility];  Service: Urology;  Laterality: Right;  . MASS EXCISION Left 03/04/2015   Procedure: EXCISION OF SOFT TISSUE NEOPLASM LEFT ARM;  Surgeon: Aviva Signs, MD;  Location: [facility];  Service: General;  Laterality: Left;  . PERCUTANEOUS NEPHROSTOLITHOTOMY Bilateral 12/  2011     [facility]  . POLYPECTOMY N/A 03/17/2013   Procedure: GASTRIC POLYPECTOMY;  Surgeon: Danie Binder, MD;  Location[facility];  Service: Endoscopy;  Laterality: N/A;  . POLYPECTOMY  02/26/2017   Procedure: POLYPECTOMY;  Surgeon: Danie Binder, MD;  [facility];  Service: Endoscopy;;  polyp at cecum, ascending colon polyps x3, hepatic flexure polyps x8, transverse colon polyps x8   . REMOVAL RIGHT THIGH CYST  2006  . SAVORY DILATION N/A 03/17/2013   Procedure: SAVORY DILATION;  Surgeon: Carlyon Prows  Rexene Edison, MD;  Location: [facility];  Service: Endoscopy;  Laterality: N/A;  #12.8, 14, 15, 16 dilators used  . SAVORY DILATION N/A 06/10/2015   Procedure: SAVORY DILATION;  Surgeon: Danie Binder, MD;  [facility];  Service: Endoscopy;  Laterality: N/A;  . TRANSTHORACIC ECHOCARDIOGRAM  01-01-2014   mild LVH/  ef 16-10%/  grade I diastolic dysfunction/  trivial MR, TR, and PR  . VAGINAL HYSTERECTOMY  1970's    Current Outpatient Rx  . Order #: [id number] Class: Print  . Order #: [id number] Class: Normal  . Order #: [id number] Class: Historical  Med  . Order #: [id number] Class: Print  . Order #: [id number] Class: Historical Med  . Order #: [id number] Class: Historical Med  . Order #: [id number] Class: Historical Med  . Order #: [id number] Class: Historical Med  . Order #: [id number] Class: Historical Med  . Order #: [id number] Class: Normal  . Order #: [id number] Class: Print  . Order #: [id number] Class: Historical Med  . Order #: [id number] Class: Normal  . Order #: [id number] Class: Normal  . Order #: [id number] Class: Normal  . Order #: [id number] Class: Print  . Order #: [id number] Class: Normal  . Order #: [id number] Class: Print    Allergies Ace inhibitors; Keflex [cephalexin]; Nitrofurantoin; and Penicillins  Family History  Problem Relation Age of Onset  . Hypertension Mother   . Diabetes Mother   . Heart failure Mother   . Dementia Mother   . Emphysema Father   . Hypertension Father   . Diabetes Brother   . GER disease Brother   . Hypertension Sister   . Hypertension Sister   . Cancer Unknown        family history   . Diabetes Unknown        family history   . Heart defect Unknown        famiily history   . Arthritis Unknown        family history   . Anesthesia problems Neg Hx   . Hypotension Neg Hx   . Malignant hyperthermia Neg Hx   . Pseudochol deficiency Neg Hx   . Colon cancer Neg Hx     Social History Social History   Tobacco Use  . Smoking status: Former Smoker    Packs/day: 1.00    Years: 20.00    Pack years: 20.00    Types: Cigarettes    Start date: 09/21/1974    Last attempt to quit: 09/20/1988    Years since quitting: 29.1  . Smokeless tobacco: Never Used  . Tobacco comment: Quit x 20 years  Substance Use Topics  . Alcohol use: No    Alcohol/week: 0.0 oz  . Drug use: No    Review of Systems  All other systems negative except as documented in the HPI. All pertinent positives and negatives as reviewed in the HPI. ____________________________________________   PHYSICAL EXAM:  VITAL SIGNS: ED  Triage Vitals  Enc Vitals Group     BP 10/24/17 1656 114/75     Pulse Rate 10/24/17 1656 99     Resp 10/24/17 1656 16     Temp 10/24/17 1656 97.8 F (36.6 C)     Temp Source 10/24/17 1656 Oral     SpO2 10/24/17 1656 97 %    Constitutional: Alert and oriented. Well appearing and in no acute distress. Eyes: Conjunctivae are normal. PERRL. EOMI. Head: Atraumatic. Nose: No congestion/rhinnorhea. Mouth/Throat: Mucous membranes are moist.  Oropharynx non-erythematous. Neck: No stridor.  No meningeal signs.   Cardiovascular: Normal rate, regular rhythm.  Good peripheral circulation. Grossly normal heart sounds.   Respiratory: Normal respiratory effort.  No retractions. Lungs CTAB. Gastrointestinal: Soft and nontender.  Mild distention.  No tympany to percussion. Musculoskeletal: No lower extremity tenderness nor edema. No gross deformities of extremities. Neurologic:  Normal speech and language. No gross focal neurologic deficits are appreciated.  Skin:  Skin is warm, dry and intact. No rash noted.   ____________________________________________   LABS (all labs ordered are listed, but only abnormal results are displayed)  Labs Reviewed  COMPREHENSIVE METABOLIC PANEL - Abnormal; Notable for the following components:      Result Value   Potassium 3.3 (*)    Chloride 99 (*)    Glucose, Bld 254 (*)    Creatinine, Ser 1.59 (*)    Total Protein 8.9 (*)    GFR calc non Af Amer 31 (*)    GFR calc Af Amer 36 (*)    All other components within normal limits  URINALYSIS, ROUTINE W REFLEX MICROSCOPIC - Abnormal; Notable for the following components:   APPearance CLOUDY (*)    Hgb urine dipstick LARGE (*)    Protein, ur 30 (*)    Leukocytes, UA LARGE (*)    WBC, UA >50 (*)    Bacteria, UA MANY (*)    All other components within normal limits  LIPASE, BLOOD  CBC   ____________________________________________  RADIOLOGY  Ct Abdomen Pelvis Wo Contrast  Result Date:  10/24/2017 CLINICAL DATA:  Pt states abd bloating with nausea when eating for one week Pt. States recently diagnosed with blood cancer. Pt. declines IV. Per earlier bone marrow biopsy report, history of monoclonal paraproteinemia. EXAM: CT ABDOMEN AND PELVIS WITHOUT CONTRAST TECHNIQUE: Multidetector CT imaging of the abdomen and pelvis was performed following the standard protocol without IV contrast. COMPARISON:  CT abdomen dated 08/19/2015. FINDINGS: Lower chest: No acute abnormality. 13 mm mass within the lower portion of the anterior mediastinum, incompletely imaged, possibilities including pericardial cyst or lymph node based on CT density measurements, but stable for greater than 2 years indicating benignity. Hepatobiliary: No focal liver abnormality is seen. No gallstones, gallbladder wall thickening, or biliary dilatation. Pancreas: Unremarkable. No pancreatic ductal dilatation or surrounding inflammatory changes. Spleen: Normal in size without focal abnormality. Adrenals/Urinary Tract: Adrenal glands appear normal. Numerous LEFT renal calculi, largest measuring 5 mm, majority 1-2 mm in size. RIGHT kidney is unremarkable without stone or hydronephrosis. 1 mm stone within the proximal LEFT ureter without significant hydronephrosis. No RIGHT-sided ureteral stone or bladder stone. Bladder is unremarkable, partially decompressed. Stomach/Bowel: Bowel is normal in caliber. Scattered diverticulosis within the descending and sigmoid colon without evidence of acute diverticulitis. Appendix is normal. Stomach is unremarkable. Vascular/Lymphatic: Aortic atherosclerosis. No enlarged abdominal or pelvic lymph nodes. Reproductive: Presumed hysterectomy.  No adnexal mass or free fluid. Other: No free fluid or abscess collection. No free intraperitoneal air. Musculoskeletal: No acute or suspicious osseous abnormality. IMPRESSION: 1. 1 mm stone within the proximal LEFT ureter, without associated hydronephrosis. 2. Numerous  LEFT renal stones, largest measuring 5 mm, majority measuring 1-2 mm in size. 3. Colonic diverticulosis without evidence of acute diverticulitis. 4. Aortic atherosclerosis. Electronically Signed   By: Franki Cabot M.D.   On: 10/24/2017 22:17    ____________________________________________   PROCEDURES  Procedure(s) performed:   Procedures   ____________________________________________   INITIAL IMPRESSION / ASSESSMENT AND PLAN / ED COURSE  Complaint abdominal distention and a 72 year old female could be worrisome for some type of malignancy.  Will CT scan  for same.  If normal will need to follow with PCP.  Workup c/w likely UTI. Will tx for same. Xanax for a week (can't seem to get in with doctor sooner). otherwise pcp follow up for distension. No obvious blockage, cancer or ascites. Unclear etiology.    Pertinent labs & imaging results that were available during my care of the patient were reviewed by me and considered in my medical decision making (see chart for details).  ____________________________________________  FINAL CLINICAL IMPRESSION(S) / ED DIAGNOSES  Final diagnoses:  Abdominal distension  Urinary tract infection without hematuria, site unspecified     MEDICATIONS GIVEN DURING THIS VISIT:  Medications  ALPRAZolam (XANAX) tablet 0.5 mg (0.5 mg Oral Given 10/25/17 0008)  ciprofloxacin (CIPRO) tablet 500 mg (500 mg Oral Given 10/25/17 0008)     NEW OUTPATIENT MEDICATIONS STARTED DURING THIS VISIT:  Discharge Medication List as of 10/24/2017 11:51 PM    START taking these medications   Details  ciprofloxacin (CIPRO) 500 MG tablet Take 1 tablet (500 mg total) by mouth 2 (two) times daily., Starting Thu 10/24/2017, Print    ondansetron (ZOFRAN) 4 MG tablet Take 1 tablet (4 mg total) by mouth every 8 (eight) hours as needed for nausea or vomiting., Starting Thu 10/24/2017, Print    ranitidine (ZANTAC) 150 MG tablet Take 1 tablet (150 mg total) by mouth 2 (two)  times daily., Starting Thu 10/24/2017, Print        Note:  This note was prepared with assistance of Dragon voice recognition software. Occasional wrong-word or sound-a-like substitutions may have occurred due to the inherent limitations of voice recognition software.   Stanislav Gervase, Corene Cornea, MD 10/25/17 0040

## 2017-10-24 NOTE — ED Notes (Signed)
Patient unable to void at this time

## 2017-10-24 NOTE — ED Notes (Signed)
This nurse to room to attempt IV access; Registration in room attempting to complete insurance information. Pt on phone with insurance agent as he is explaining how we do not have her medicaid information, only her supplement insurance. Pt is upset and tearful, attempted IV access to right AC, which had been previously stuck by lab. This vein blew when attempting IV access, however pt would not let me attempt access in forearm (which was a good vein). This nurse informed Dr Dayna Barker of pt condition and emotional state- new orders received for CT w/o contrast. Attempted to console and reassure pt, pt still upset at this time.  Tiffany, [facility] made aware of situation- room to speak with pt.

## 2017-10-28 ENCOUNTER — Telehealth: Payer: Self-pay | Admitting: Family Medicine

## 2017-10-28 NOTE — Telephone Encounter (Signed)
Looks like it was d/c'd at her last hospital stay. Please advise if ok to refill?

## 2017-10-28 NOTE — Telephone Encounter (Signed)
Pt is requesting rx for ALPRAZolam (XANAX) 0.5 MG tablet [[id number]]   Please call in and call patient and let her know it is done - [phone]

## 2017-10-29 ENCOUNTER — Other Ambulatory Visit: Payer: Self-pay

## 2017-10-29 ENCOUNTER — Other Ambulatory Visit (HOSPITAL_COMMUNITY): Payer: Self-pay | Admitting: *Deleted

## 2017-10-29 DIAGNOSIS — C9000 Multiple myeloma not having achieved remission: Secondary | ICD-10-CM

## 2017-10-29 MED ORDER — ALPRAZOLAM 0.5 MG PO TABS: 0.5000 mg | ORAL_TABLET | Freq: Every evening | ORAL | 1 refills | Status: DC | PRN

## 2017-10-29 NOTE — Telephone Encounter (Signed)
Collected script today

## 2017-10-29 NOTE — Telephone Encounter (Signed)
It wasn't d/c'd. The er dr just gave her 7 to last since she was out and could contact her pcp for refill.

## 2017-10-30 ENCOUNTER — Inpatient Hospital Stay (HOSPITAL_COMMUNITY): Payer: Medicare Other | Attending: Hematology

## 2017-10-30 ENCOUNTER — Ambulatory Visit (HOSPITAL_COMMUNITY): Payer: Medicare Other | Admitting: Internal Medicine

## 2017-10-30 DIAGNOSIS — C9000 Multiple myeloma not having achieved remission: Secondary | ICD-10-CM

## 2017-10-30 DIAGNOSIS — D509 Iron deficiency anemia, unspecified: Secondary | ICD-10-CM | POA: Diagnosis not present

## 2017-10-30 LAB — IRON AND TIBC
Iron: 59 ug/dL (ref 28–170)
Saturation Ratios: 13 % (ref 10.4–31.8)
TIBC: 440 ug/dL (ref 250–450)
UIBC: 381 ug/dL

## 2017-10-30 LAB — CBC WITH DIFFERENTIAL/PLATELET
Basophils Absolute: 0 10*3/uL (ref 0.0–0.1)
Basophils Relative: 0 %
Eosinophils Absolute: 0.2 10*3/uL (ref 0.0–0.7)
Eosinophils Relative: 2 %
HCT: 42.3 % (ref 36.0–46.0)
Hemoglobin: 13.7 g/dL (ref 12.0–15.0)
Lymphocytes Relative: 23 %
Lymphs Abs: 2 10*3/uL (ref 0.7–4.0)
MCH: 32.1 pg (ref 26.0–34.0)
MCHC: 32.4 g/dL (ref 30.0–36.0)
MCV: 99.1 fL (ref 78.0–100.0)
Monocytes Absolute: 0.9 10*3/uL (ref 0.1–1.0)
Monocytes Relative: 10 %
Neutro Abs: 5.8 10*3/uL (ref 1.7–7.7)
Neutrophils Relative %: 65 %
Platelets: 311 10*3/uL (ref 150–400)
RBC: 4.27 MIL/uL (ref 3.87–5.11)
RDW: 14.6 % (ref 11.5–15.5)
WBC: 8.9 10*3/uL (ref 4.0–10.5)

## 2017-10-30 LAB — FERRITIN: Ferritin: 23 ng/mL (ref 11–307)

## 2017-10-30 LAB — COMPREHENSIVE METABOLIC PANEL
ALT: 28 U/L (ref 14–54)
AST: 29 U/L (ref 15–41)
Albumin: 4.3 g/dL (ref 3.5–5.0)
Alkaline Phosphatase: 113 U/L (ref 38–126)
Anion gap: 8 (ref 5–15)
BUN: 13 mg/dL (ref 6–20)
CO2: 29 mmol/L (ref 22–32)
Calcium: 9.7 mg/dL (ref 8.9–10.3)
Chloride: 100 mmol/L — ABNORMAL LOW (ref 101–111)
Creatinine, Ser: 1.84 mg/dL — ABNORMAL HIGH (ref 0.44–1.00)
GFR calc Af Amer: 30 mL/min — ABNORMAL LOW (ref >60)
GFR calc non Af Amer: 26 mL/min — ABNORMAL LOW (ref >60)
Glucose, Bld: 174 mg/dL — ABNORMAL HIGH (ref 65–99)
Potassium: 4.5 mmol/L (ref 3.5–5.1)
Sodium: 137 mmol/L (ref 135–145)
Total Bilirubin: 0.4 mg/dL (ref 0.3–1.2)
Total Protein: 9.1 g/dL — ABNORMAL HIGH (ref 6.5–8.1)

## 2017-10-31 LAB — KAPPA/LAMBDA LIGHT CHAINS
Kappa free light chain: 54.6 mg/L — ABNORMAL HIGH (ref 3.3–19.4)
Kappa, lambda light chain ratio: 3.33 — ABNORMAL HIGH (ref 0.26–1.65)
Lambda free light chains: 16.4 mg/L (ref 5.7–26.3)

## 2017-10-31 LAB — BETA 2 MICROGLOBULIN, SERUM: Beta-2 Microglobulin: 3.1 mg/L — ABNORMAL HIGH (ref 0.6–2.4)

## 2017-11-03 ENCOUNTER — Other Ambulatory Visit: Payer: Self-pay | Admitting: Family Medicine

## 2017-11-04 LAB — MULTIPLE MYELOMA PANEL, SERUM
Albumin SerPl Elph-Mcnc: 4.2 g/dL (ref 2.9–4.4)
Albumin/Glob SerPl: 1 (ref 0.7–1.7)
Alpha 1: 0.2 g/dL (ref 0.0–0.4)
Alpha2 Glob SerPl Elph-Mcnc: 0.9 g/dL (ref 0.4–1.0)
B-Globulin SerPl Elph-Mcnc: 1.4 g/dL — ABNORMAL HIGH (ref 0.7–1.3)
Gamma Glob SerPl Elph-Mcnc: 2 g/dL — ABNORMAL HIGH (ref 0.4–1.8)
Globulin, Total: 4.5 g/dL — ABNORMAL HIGH (ref 2.2–3.9)
IgA: 273 mg/dL (ref 64–422)
IgG (Immunoglobin G), Serum: 2334 mg/dL — ABNORMAL HIGH (ref 700–1600)
IgM (Immunoglobulin M), Srm: 146 mg/dL (ref 26–217)
M Protein SerPl Elph-Mcnc: 1.5 g/dL — ABNORMAL HIGH
Total Protein ELP: 8.7 g/dL — ABNORMAL HIGH (ref 6.0–8.5)

## 2017-11-05 ENCOUNTER — Ambulatory Visit (INDEPENDENT_AMBULATORY_CARE_PROVIDER_SITE_OTHER): Payer: Medicare Other | Admitting: Family Medicine

## 2017-11-05 ENCOUNTER — Other Ambulatory Visit: Payer: Self-pay

## 2017-11-05 ENCOUNTER — Encounter: Payer: Self-pay | Admitting: Family Medicine

## 2017-11-05 VITALS — BP 144/90 | HR 98 | Resp 20 | Ht 66.0 in | Wt 211.0 lb

## 2017-11-05 DIAGNOSIS — E1121 Type 2 diabetes mellitus with diabetic nephropathy: Secondary | ICD-10-CM | POA: Diagnosis not present

## 2017-11-05 DIAGNOSIS — F5104 Psychophysiologic insomnia: Secondary | ICD-10-CM

## 2017-11-05 DIAGNOSIS — R52 Pain, unspecified: Secondary | ICD-10-CM | POA: Diagnosis not present

## 2017-11-05 DIAGNOSIS — E785 Hyperlipidemia, unspecified: Secondary | ICD-10-CM

## 2017-11-05 DIAGNOSIS — I10 Essential (primary) hypertension: Secondary | ICD-10-CM

## 2017-11-05 MED ORDER — TRAZODONE HCL 50 MG PO TABS: 25.0000 mg | ORAL_TABLET | Freq: Every evening | ORAL | 3 refills | Status: DC | PRN

## 2017-11-05 MED ORDER — TIZANIDINE HCL 2 MG PO TABS: ORAL_TABLET | ORAL | 1 refills | Status: DC

## 2017-11-05 NOTE — Patient Instructions (Addendum)
F/u in 5 weeks, call if you need me  Before  Zanaflex for pain and and trazodone are prscribed for pain and sleep  hBA1C , and fasting lipid, cmp and EGFR, TSH and vit D 1 week before next visit also microalb when you get labs  [facility] you feel better soon

## 2017-11-05 NOTE — Progress Notes (Signed)
Cheryl Abbott     MRN: [id number]      DOB: 11-11-1945   HPI Cheryl Abbott is here c/o generalized pain from neck down for the past 2 weeks, not in her head, also c/o swelling of the ankles.and legs to mid calf States she has not been able to sleep for the past 3 nights, feels angry, states she has difficulty getting appointments ROS Denies recent fever or chills. Denies sinus pressure, nasal congestion, ear pain or sore throat. Denies chest congestion, productive cough or wheezing. Denies chest pains, palpitations and leg swelling Denies abdominal pain, nausea, vomiting,diarrhea or constipation.   Denies dysuria, frequency, hesitancy or incontinenDenies headaches, seizures, numbness, or tingling.  Denies skin break down or rash.   PE  BP (!) 144/90 (BP Location: Right Arm, Patient Position: Sitting, Cuff Size: Large)   Pulse 98   Resp 20   Ht 5\' 6"  (1.676 m)   Wt 211 lb 0.6 oz (95.7 kg)   SpO2 98%   BMI 34.06 kg/m   Patient alert and oriented and in no cardiopulmonary distress.  HEENT: No facial asymmetry, EOMI,   oropharynx pink and moist.  Neck decreased ROM, no JVD, no mass.  Chest: Clear to auscultation bilaterally.  CVS: S1, S2 no murmurs, no S3.Regular rate.  ABD: Soft non tender.   Ext: No edema  MS: decreased  ROM spine, shoulders, hips and knees.  Skin: Intact, no ulcerations or rash noted.  Psych: Good eye contact, normal affect. Memory intact mildly  anxious and  depressed appearing.  CNS: CN 2-12 intact, power,  normal throughout.no focal deficits noted.   Assessment & Plan  Insomnia Uncontrolled Sleep hygiene reviewed and written information offered also. Prescription sent for  medication needed. Trazodone added, review in 5 weeks, also to help with anxiety and depresssion  Hypertension goal BP (blood pressure) < 130/80 Uncontrolled at voisit, pt reports anger and stress and lack of sleep , will re evaluate before med adjustment DASH diet  and commitment to daily physical activity for a minimum of 30 minutes discussed and encouraged, as a part of hypertension management. The importance of attaining a healthy weight is also discussed.  BP/Weight 11/08/2017 11/05/2017 10/24/2017 09/16/2017 09/10/2017 3/55/7322 0/07/5425  Systolic BP 062 376 283 151 761 607 371  Diastolic BP 67 90 72 73 70 67 83  Wt. (Lbs) 209.56 211.04 - 209 209 208 204.4  BMI 33.82 34.06 - 33.73 33.73 33.57 32.99       Morbid obesity (HCC) Unchanged. Patient re-educated about  the importance of commitment to a  minimum of 150 minutes of exercise per week.  The importance of healthy food choices with portion control discussed.  Goals set by the patient for the next several months.   Weight /BMI 11/08/2017 11/05/2017 09/16/2017  WEIGHT 209 lb 9 oz 211 lb 0.6 oz 209 lb  HEIGHT 5\' 6"  5\' 6"  5\' 6"   BMI 33.82 kg/m2 34.06 kg/m2 33.73 kg/m2      Hyperlipidemia LDL goal <100 Hyperlipidemia:Low fat diet discussed and encouraged.   Lipid Panel  Lab Results  Component Value Date   CHOL 181 06/12/2017   HDL 47 (L) 06/12/2017   LDLCALC 104 (H) 06/12/2017   TRIG 188 (H) 06/12/2017   CHOLHDL 3.9 06/12/2017   Not at goal Updated lab needed at/ before next visit.     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) [name] is reminded of the importance of commitment to daily physical activity for 30  minutes or more, as able and the need to limit carbohydrate intake to 30 to 60 grams per meal to help with blood sugar control.   The need to take medication as prescribed, test blood sugar as directed, and to call between visits if there is a concern that blood sugar is uncontrolled is also discussed.   [name] is reminded of the importance of daily foot exam, annual eye examination, and good blood sugar, blood pressure and cholesterol control. Updated lab needed at/ before next visit.   Diabetic Labs Latest Ref Rng & Units 10/30/2017 10/24/2017 09/13/2017 08/30/2017  08/02/2017  HbA1c <5.7 % of total Hgb - - - - -  Microalbumin <2.0 mg/dL - - - - -  Micro/Creat Ratio 0.0 - 30.0 mg/g - - - - -  Chol <200 mg/dL - - - - -  HDL >50 mg/dL - - - - -  Calc LDL mg/dL (calc) - - - - -  Triglycerides <150 mg/dL - - - - -  Creatinine 0.44 - 1.00 mg/dL 1.84(H) 1.59(H) 1.47(H) 1.41(H) 1.75(H)   BP/Weight 11/08/2017 11/05/2017 10/24/2017 09/16/2017 09/10/2017 6/72/0947 0/02/6282  Systolic BP 662 947 654 650 354 656 812  Diastolic BP 67 90 72 73 70 67 83  Wt. (Lbs) 209.56 211.04 - 209 209 208 204.4  BMI 33.82 34.06 - 33.73 33.73 33.57 32.99   Foot/eye exam completion dates Latest Ref Rng & Units 04/17/2017 03/18/2017  Eye Exam No Retinopathy No Retinopathy -  Foot exam Order - - -  Foot Form Completion - - Done        Generalized pain C/o increased generalized pain and spasm, start low dose muscle relaxant

## 2017-11-08 ENCOUNTER — Encounter (HOSPITAL_COMMUNITY): Payer: Self-pay | Admitting: Internal Medicine

## 2017-11-08 ENCOUNTER — Inpatient Hospital Stay (HOSPITAL_BASED_OUTPATIENT_CLINIC_OR_DEPARTMENT_OTHER): Payer: Medicare Other | Admitting: Internal Medicine

## 2017-11-08 VITALS — BP 104/67 | HR 94 | Temp 98.0°F | Resp 20 | Ht 66.0 in | Wt 209.6 lb

## 2017-11-08 DIAGNOSIS — D472 Monoclonal gammopathy: Secondary | ICD-10-CM

## 2017-11-08 DIAGNOSIS — D509 Iron deficiency anemia, unspecified: Secondary | ICD-10-CM | POA: Diagnosis not present

## 2017-11-08 DIAGNOSIS — C9000 Multiple myeloma not having achieved remission: Secondary | ICD-10-CM | POA: Diagnosis not present

## 2017-11-08 NOTE — Patient Instructions (Signed)
[place] at [facility] Discharge Instructions  You saw Dr. Walden Field today. We will refer you to a Multiple Myeloma Specialist, at [facility], to be sure there is no reason for you to be treated.   Thank you for choosing [place] at [facility] to provide your oncology and hematology care.  To afford each patient quality time with our provider, please arrive at least 15 minutes before your scheduled appointment time.   If you have a lab appointment with the [facility] please come in thru the  Main Entrance and check in at the main information desk  You need to re-schedule your appointment should you arrive 10 or more minutes late.  We strive to give you quality time with our providers, and arriving late affects you and other patients whose appointments are after yours.  Also, if you no show three or more times for appointments you may be dismissed from the clinic at the providers discretion.     Again, thank you for choosing [facility].  Our hope is that these requests will decrease the amount of time that you wait before being seen by our physicians.       _____________________________________________________________  Should you have questions after your visit to [facility], please contact our office at [phone] between the hours of 8:30 a.m. and 4:30 p.m.  Voicemails left after 4:30 p.m. will not be returned until the following business day.  For prescription refill requests, have your pharmacy contact our office.       Resources For Cancer Patients and their Caregivers ? American Cancer Society: Can assist with transportation, wigs, general needs, runs Look Good Feel Better.        507-311-6759 ? Cancer Care: Provides financial assistance, online support groups, medication/co-pay assistance.  1-800-813-HOPE 715-888-1480) ? [place] Assists [place] cancer patients and their  families through emotional , educational and financial support.  (860)378-9852 ? [place] DSS Where to apply for food stamps, Medicaid and utility assistance. 715-610-4084 ? [facility]: Transportation to medical appointments. 4094534070 ? Social Security Administration: May apply for disability if have a Stage IV cancer. (318)566-3273 (424) 150-1426 ? [employer], Disability and Transit Services: Assists with nutrition, care and transit needs. [place] Support Programs:   > Cancer Support Group  2nd Tuesday of the month 1pm-2pm, Journey Room   > Creative Journey  3rd Tuesday of the month 1130am-1pm, Journey Room   Monoclonal Gammopathy of Undetermined Significance (MGUS) Monoclonal gammopathy of undetermined significance (MGUS) is a condition in which there is too much of a protein called monoclonal protein, or M protein, in the blood. MGUS can cause you to have too many cells in your blood and not enough space for healthy cells. This condition may increase your risk of developing multiple myeloma or other blood disorders in the future. What are the causes? The cause of this condition is not known. What increases the risk? You are more likely to develop this condition if:  You are African American.  You are age 52 or older.  You are female.  You have an autoimmune disease.  You have been exposed to radiation.  You have a family history of MGUS.  What are the signs or symptoms? There are no symptoms of this condition. How is this diagnosed? This condition may be diagnosed with a blood test that checks for M protein. How is this treated?  Treatment for this condition may involve:  Having regular exams. This will allow your health care provider to monitor your health.  Having tests done regularly, such as: ? Blood tests to check for M protein in your body. ? Imaging tests, such as a CT scan. ? A bone marrow biopsy. This test involves taking a  sample of bone marrow from your body so it can be looked at under a microscope.  Follow these instructions at home:  Keep all follow-up visits as told by your health care provider. This is important. Contact a health care provider if:  You have trouble swallowing.  You have pain in your back or ribs.  You have a fever.  You are bruising easily. Get help right away if:  You break a bone.  You have trouble breathing. Summary  Monoclonal gammopathy of undetermined significance (MGUS) is a condition in which there is too much of a protein called monoclonal protein, or M protein, in the blood.  This condition may be diagnosed with a blood test that checks for M protein.  Treatment for this condition may involve having tests done regularly. Tests may include blood tests, imaging tests, and a bone marrow biopsy. This information is not intended to replace advice given to you by your health care provider. Make sure you discuss any questions you have with your health care provider. Document Released: 04/18/2016 Document Revised: 04/18/2016 Document Reviewed: 04/18/2016 Elsevier Interactive Patient Education  [employer].

## 2017-11-08 NOTE — Progress Notes (Signed)
Diagnosis Monoclonal paraproteinemia - Plan: CBC with Differential/Platelet, Comprehensive metabolic panel, Lactate dehydrogenase, Protein electrophoresis, serum, Ferritin, IgG, IgA, IgM, Beta 2 microglobulin, serum, Kappa/lambda light chains  Staging Cancer Staging No matching staging information was found for the patient.  Assessment and Plan:  1.  Smoldering MM.  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  Long talk held with Cheryl Abbott and her family today.  I discussed with them I have reviewed previous records and last visit with Dr. Talbert Cage in 04/2017 and per the record, Cheryl Abbott was not recommended to begin therapy at that time.  Dr Talbert Cage discussed with them  results of bone marrow biopsy that was done on 02/19/2017 and showed:  -Bone marrow biopsy demonstrated 14% plasma cells with normal cytogenetics. -Bone survey was done on 02/15/2017 was   negative for lytic lesions.  Labs done 10/30/2017 reviewed with Cheryl Abbott and shwed WBC 8.9 HB 13.7 and plts 311,000.  Chemistries shwed Cr 1.84, Calcium 9.7 and normal LFTs.  SPEP showed M spike 1.5 g/dl that was stable with level of 1.4 g/dl on SPEP that was done 08/02/2017.  K/L ratio 3.33.    At that time in 04/2017 and presently she shows no clear evidence of CRAB (hypercalcemia, renal failure with creatinine >2, anemia <10 g/dL, bone lytic lesions) criteria for symptomatic treatment requiring starting treatment.  She will need to continue to have close monitoring with labs and this will be set up in 4 months and follow-up at that time.    Due to there concerns about not being on  therapy, she will be referred to Dr. Norma Fredrickson of [facility] for second opinion evaluation and to determine if she may be a candidate for any trials. Pending his review, she will RTC in 4 months for follow-up and labs.  All questions answered and she and family expressed understanding of information presented.    Dr. Talbert Cage had previously discussed  induction treatment with revlimid, velcade, and decadron in the future should she progress but had not indicated starting therapy at the present time.    2.  Iron deficiency anemia.  Cheryl Abbott was last treated with  IV Feraheme on 04/15/17 and 04/22/17.  Labs done 10/30/2017 reviewed with Cheryl Abbott and showed HB 13.7 and ferritin of 23.  She is complaining of extreme fatigue, so she is given the option of IV iron with Injectafer 750 mg IV D1 and D8.  She will have repeat labs 1 month after IV iron.    3.  RI.  Cr 1.84.  She is followed by nephrology.  She had a mild increase in renal function from 1.6 to 1.9.  Cheryl Abbott will be seen by Dr. Norma Fredrickson for evaluation.  Cheryl Abbott has DM and a history of kidney stones.    4.  HTN.  BP is noted to be 104/67.  Follow-up with PCP.    5.  DM.  Continue to follow-up with PCP.    6.  Depression.  Follow-up with PCP or will refer to behavioral health.    7.  Health maintenance.  Cheryl Abbott reports recent colonoscopy.  Follow-up with GI as recommended.    Interval history:  72 yr old  Female previously followed by Dr. Talbert Cage.   She was followed by her nephrologist for continued follow-up of her CKD and had labwork performed on 12/07/16. 24 hour urine IFE which demonstrated Bence-Jones protein positive; kappa type. Immunofixation showed IgG monoclonal proteins with kappa light chain specificity. Urine M spike was  8.3 mg per 24 hours. Urine light chain excess 670 mg/l. Serum immunofixation also showed IgG monoclonal protein with light chain specificity. SPEP demonstrated M spike of 1.5 g/dL. Free kappa light chains 36.8, free lambda light chains 18.5, free kappa/lambda light chain ratio 1.99. Creatinine 1.6. Calcium 9.8. Patient states she feels fatigued. She has been having abdominal bloating for the past one week also. She denies any chest pain or shortness of breath. She denies any nausea or vomiting. She denies any bone pain, however she does have bilateral ankle pains. Skeletal survey on 02/15/2017  was negative for lytic lesions. Bone marrow biopsy on 02/19/2017 demonstrated bone marrow with trilineage hematopoiesis and plasma cells representing 14% of all cells with occasional atypical forms. Immunohistochemical stains were performed and highlighted the plasma cell component in relatively limited bone marrow areas which appear to show kappa light chain excess. Cytogenetics revealed normal female karyotype with no observable clonal chromosomal abnormalities.    She was treated with  Feraheme on 04/15/17 and 04/22/17.  Current Status:  Cheryl Abbott is seen today for follow-up.  She is accompanied by family members and is here to go over labs.  Cheryl Abbott and family report at last visit they were recommended for therapy, but were concerned because therapy had not begun.    PATHOLOGY: Diagnosis Bone Marrow, Aspirate,Biopsy, and Clot, left iliac crest - BONE MARROW WITH TRILINEAGE HEMATOPOIESIS AND PLASMACYTOSIS. - SEE COMMENT. PERIPHERAL BLOOD: - MILD NORMOCYTIC-NORMOCHROMIC ANEMIA. Diagnosis Note The clot and biopsy sections are suboptimal for evaluation. The aspirate shows trilineage hematopoiesis with non-specific changes, but there is increased number of plasma cells representing 14% of all cells with occasional atypical forms. Immunohistochemical stains were performed and highlight the plasma cell component in relatively limited bone marrow areas which appear to show kappa light chain excess. In the presence of a monoclonal protein with kappa light specificity, the limited findings favor plasma cell neoplasm. Correlation with cytogenetic and FISH studies are recommended. (BNS:ah/gt 02/20/17) Susanne Greenhouse MD Pathologist, Electronic RADIOGRAPHIC STUDIES: I have personally reviewed the radiological images as listed and agree with the findings in the report      Problem List Patient Active Problem List   Diagnosis Date Noted  . Multiple myeloma ([place]) [C90.00] 03/15/2017  . Urinary frequency [R35.0]  02/28/2017  . SIRS (systemic inflammatory response syndrome) ([place]) [R65.10] 02/26/2017  . Monoclonal paraproteinemia [D47.2] 02/15/2017  . Morbid obesity ([place]) [E66.01] 10/14/2016  . Vaginal dryness, menopausal [N95.1] 10/14/2016  . Neuropathic pain of both feet [G57.93] 04/11/2016  . TIA (transient ischemic attack) [G45.9] 02/26/2016  . Lactose intolerance in adult [E73.9] 02/01/2016  . Brain TIA [G45.9] 08/19/2015  . CKD (chronic kidney disease) stage 3, GFR 30-59 ml/min (HCC) [N18.3] 07/12/2015  . Lipoma of arm [D17.20] 01/19/2015  . Anemia [D64.9] 10/14/2014  . Lumbar back pain with radiculopathy affecting left lower extremity [M54.16] 05/11/2014  . Nocturnal hypoxia [G47.34] 02/04/2014  . Generalized anxiety disorder [F41.1] 05/20/2013  . Anxiety and depression [F41.9, F32.9] 08/13/2012  . Abnormal brain scan [R94.02] 02/07/2012  . Thoracic or lumbosacral neuritis or radiculitis, unspecified [IMO0002] 12/22/2010  . Hypercalcemia [E83.52] 11/30/2010  . Colon adenomas [D12.6] 05/11/2009  . HEMORRHOIDS [K64.9] 03/15/2009  . GERD [K21.9] 03/15/2009  . FATTY LIVER DISEASE [K76.89] 03/15/2009  . RENAL CALCULUS [N20.0] 03/15/2009  . Sleep apnea [G47.30] 09/15/2008  . 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 Hyperlipidemia LDL goal <100 [E78.5] 09/17/2007  . Hypertension goal BP (blood pressure) < 130/80 [I10] 09/17/2007  Past Medical History Past Medical History:  Diagnosis Date  . Anemia   . Anxiety   . Anxiety disorder   . Arthritis   . Cancer ([place])   . 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 Chronic back pain   . Depression   . Fatty liver   . GERD (gastroesophageal reflux disease)   . Headache   . History of adenomatous polyp of colon    2008  . History of kidney stones   . Hypercalcemia   . Hyperlipidemia   . Hypertension   . Left ureteral stone   . Lipoma of arm 01/19/2015  . Lumbar disc disease with radiculopathy   . Nephrolithiasis    . Neuropathy, peripheral   . OSA treated with BiPAP    per study 2007  . Sciatica   . Sigmoid diverticulosis   . Type 2 diabetes mellitus [facility])     Past Surgical History Past Surgical History:  Procedure Laterality Date  . BIOPSY N/A 03/17/2013   Procedure: GASTRIC BIOPSIES;  Surgeon: Danie Binder, MD;  Location: [facility];  Service: Endoscopy;  Laterality: N/A;  . BIOPSY  06/10/2015   Procedure: BIOPSY;  Surgeon: Danie Binder, MD;  [facility];  Service: Endoscopy;;  gastric biopsy  . CARDIAC CATHETERIZATION  05-11-2003  dr Shelva Majestic ([facility])   Abnormal cardiolite/   Normal coronary arteries and normal LVF,  ef  63%  . CARDIOVASCULAR STRESS TEST  01-01-2014   normal lexiscan cardiolite/  no ischemia/ infarct/  normal LV function and wall motion ,  ef 81%  . COLONOSCOPY  last one 10/02/2011   MOD [place] TICS, IH, NEXT TCS APR 2018  . COLONOSCOPY WITH PROPOFOL N/A 02/26/2017   Procedure: COLONOSCOPY WITH PROPOFOL;  Surgeon: Danie Binder, MD;  [facility];  Service: Endoscopy;  Laterality: N/A;  11:00am  . CYSTO/   RIGHT URETEROSOCOPY LASER LITHOTRIPSY STONE EXTRACTION  10-13-2004  . CYSTO/  RIGHT RETROGRADE PYELOGRAM/  PLACMENT RIGHT URETERAL STENT  01-10-2010  . CYSTOSCOPY W/ URETERAL STENT PLACEMENT Right 08/19/2015   Procedure: CYSTOSCOPY WITH RETROGRADE PYELOGRAM/URETERAL STENT PLACEMENT;  Surgeon: Franchot Gallo, MD;  Location: [facility];  Service: Urology;  Laterality: Right;  . CYSTOSCOPY WITH RETROGRADE PYELOGRAM, URETEROSCOPY AND STENT PLACEMENT Left 10/21/2014   Procedure: CYSTOSCOPY WITH RETROGRADE PYELOGRAM, URETEROSCOPY AND STENT PLACEMENT;  Surgeon: Franchot Gallo, MD;  Location: [facility];  Service: Urology;  Laterality: Left;  . CYSTOSCOPY WITH RETROGRADE PYELOGRAM, URETEROSCOPY AND STENT PLACEMENT Right 11/22/2015   Procedure: CYSTOSCOPY, RIGHT URETERAL STENT REMOVAL; RIGHT RETROGRADE PYELOGRAM, RIGHT  URETEROSCOPY WITH BALLOON DILATION; RIGHT URETERAL STENT PLACEMENT;  Surgeon: Franchot Gallo, MD;  [facility];  Service: Urology;  Laterality: Right;  . CYSTOSCOPY/RETROGRADE/URETEROSCOPY/STONE EXTRACTION WITH BASKET Bilateral 08/02/2015   Procedure: CYSTOSCOPY; BILATERAL RETROGRADE PYELOGRAMS; BILATERAL URETEROSCOPY, STONE EXTRACTION WITH BASKET;  Surgeon: Franchot Gallo, MD;  [facility];  Service: Urology;  Laterality: Bilateral;  . CYSTOSCOPY/RETROGRADE/URETEROSCOPY/STONE EXTRACTION WITH BASKET Right 06/28/2015   Procedure: CYSTOSCOPY/RETROGRADE/URETEROSCOPY/STONE EXTRACTION WITH BASKET, RIGHT URETERAL DOUBLE J STENT PLACEMENT;  Surgeon: Franchot Gallo, MD;  [facility];  Service: Urology;  Laterality: Right;  . ESOPHAGOGASTRODUODENOSCOPY (EGD) WITH PROPOFOL N/A 03/17/2013   Procedure: ESOPHAGOGASTRODUODENOSCOPY (EGD) WITH PROPOFOL;  Surgeon: Danie Binder, MD;  [facility];  Service: Endoscopy;  Laterality: N/A;  . ESOPHAGOGASTRODUODENOSCOPY (EGD) WITH PROPOFOL N/A 06/10/2015   Distal gastritis, distal esophageal stricture s/p dilation  . ESOPHAGOGASTRODUODENOSCOPY (EGD) WITH PROPOFOL N/A 02/26/2017  Procedure: ESOPHAGOGASTRODUODENOSCOPY (EGD) WITH PROPOFOL;  Surgeon: Danie Binder, MD;  [facility];  Service: Endoscopy;  Laterality: N/A;  . FLEXIBLE SIGMOIDOSCOPY  09/11/2011   DUK:[id number] Internal hemorrhoids  . HOLMIUM LASER APPLICATION Left 2/37/6283   Procedure: HOLMIUM LASER APPLICATION;  Surgeon: Franchot Gallo, MD;  Location: [facility];  Service: Urology;  Laterality: Left;  . HOLMIUM LASER APPLICATION Right 1/51/7616   Procedure: HOLMIUM LASER APPLICATION;  Surgeon: Franchot Gallo, MD;  [facility];  Service: Urology;  Laterality: Right;  . MASS EXCISION Left 03/04/2015   Procedure: EXCISION OF SOFT TISSUE NEOPLASM LEFT ARM;  Surgeon: Aviva Signs, MD;  Location: [facility];  Service: General;  Laterality: Left;  .  PERCUTANEOUS NEPHROSTOLITHOTOMY Bilateral 12/  2011     [facility]  . POLYPECTOMY N/A 03/17/2013   Procedure: GASTRIC POLYPECTOMY;  Surgeon: Danie Binder, MD;  [facility];  Service: Endoscopy;  Laterality: N/A;  . POLYPECTOMY  02/26/2017   Procedure: POLYPECTOMY;  Surgeon: Danie Binder, MD;  [facility];  Service: Endoscopy;;  polyp at cecum, ascending colon polyps x3, hepatic flexure polyps x8, transverse colon polyps x8   . REMOVAL RIGHT THIGH CYST  2006  . SAVORY DILATION N/A 03/17/2013   Procedure: SAVORY DILATION;  Surgeon: Danie Binder, MD;  Location[facility];  Service: Endoscopy;  Laterality: N/A;  #12.8, 14, 15, 16 dilators used  . SAVORY DILATION N/A 06/10/2015   Procedure: SAVORY DILATION;  Surgeon: Danie Binder, MD;  [facility];  Service: Endoscopy;  Laterality: N/A;  . TRANSTHORACIC ECHOCARDIOGRAM  01-01-2014   mild LVH/  ef 07-37%/  grade I diastolic dysfunction/  trivial MR, TR, and PR  . VAGINAL HYSTERECTOMY  1970's    Family History Family History  Problem Relation Age of Onset  . Hypertension Mother   . Diabetes Mother   . Heart failure Mother   . Dementia Mother   . Emphysema Father   . Hypertension Father   . Diabetes Brother   . GER disease Brother   . Hypertension Sister   . Hypertension Sister   . Cancer Unknown        family history   . Diabetes Unknown        family history   . Heart defect Unknown        famiily history   . Arthritis Unknown        family history   . Anesthesia problems Neg Hx   . Hypotension Neg Hx   . Malignant hyperthermia Neg Hx   . Pseudochol deficiency Neg Hx   . Colon cancer Neg Hx      Social History  reports that she quit smoking about 29 years ago. Her smoking use included cigarettes. She started smoking about 43 years ago. She has a 20.00 pack-year smoking history. She has never used smokeless tobacco. She reports that she does not drink alcohol or use drugs.  Medications  Current  Outpatient Medications:  .  ALPRAZolam (XANAX) 0.5 MG tablet, Take 1 tablet (0.5 mg total) by mouth at bedtime as needed for anxiety or sleep., Disp: 60 tablet, Rfl: 1 .  amLODipine (NORVASC) 10 MG tablet, Take 1 tablet (10 mg total) by mouth daily., Disp: 90 tablet, Rfl: 3 .  aspirin EC 325 MG tablet, Take 325 mg by mouth daily., Disp: , Rfl:  .  clopidogrel (PLAVIX) 75 MG tablet, Take 75 mg by mouth daily., Disp: , Rfl:  .  Cyanocobalamin (B-12 PO), Take 1 tablet by mouth daily., Disp: , Rfl:  .  diclofenac sodium (VOLTAREN) 1 % GEL, Apply 1 application topically 2 (two) times daily., Disp: , Rfl: 2 .  diphenhydramine-acetaminophen (TYLENOL PM) 25-500 MG TABS tablet, Take 2 tablets by mouth at bedtime., Disp: , Rfl:  .  docusate sodium (COLACE) 100 MG capsule, Take 200 mg by mouth at bedtime. , Disp: , Rfl:  .  iron polysaccharides (NIFEREX) 150 MG capsule, Take 150 mg by mouth daily., Disp: , Rfl:  .  lovastatin (MEVACOR) 40 MG tablet, TAKE 2 TABLETS (80 MG TOTAL) BY MOUTH AT BEDTIME., Disp: 180 tablet, Rfl: 1 .  OXYGEN, Inhale into the lungs daily. Bedtime, Disp: , Rfl:  .  pantoprazole (PROTONIX) 40 MG tablet, TAKE 1 TABLET (40 MG TOTAL) BY MOUTH 2 (TWO) TIMES DAILY., Disp: 180 tablet, Rfl: 1 .  PARoxetine (PAXIL) 40 MG tablet, TAKE 1 TABLET BY MOUTH EVERY DAY IN THE MORNING, Disp: 90 tablet, Rfl: 0 .  potassium chloride SA (K-DUR,KLOR-CON) 20 MEQ tablet, Take 1 tablet (20 mEq total) by mouth daily., Disp: 90 tablet, Rfl: 1 .  ranitidine (ZANTAC) 150 MG tablet, Take 1 tablet (150 mg total) by mouth 2 (two) times daily., Disp: 60 tablet, Rfl: 0 .  tiZANidine (ZANAFLEX) 2 MG tablet, One tablet twice daily as needed , for pain and spasm, Disp: 30 tablet, Rfl: 1 .  TRADJENTA 5 MG TABS tablet, TAKE 1 TABLET BY MOUTH EVERY DAY, Disp: 90 tablet, Rfl: 1 .  traZODone (DESYREL) 50 MG tablet, Take 0.5-1 tablets (25-50 mg total) by mouth at bedtime as needed for sleep., Disp: 30 tablet, Rfl: 3 .   triamcinolone cream (KENALOG) 0.1 %, Apply 1 application topically 3 (three) times daily., Disp: 30 g, Rfl: 0  Allergies Ace inhibitors; Keflex [cephalexin]; Nitrofurantoin; and Penicillins  Review of Systems Review of Systems - Oncology ROS as per HPI otherwise 12 point ROS is negative.   Physical Exam  Vitals Wt Readings from Last 3 Encounters:  11/08/17 209 lb 9 oz (95.1 kg)  11/05/17 211 lb 0.6 oz (95.7 kg)  09/16/17 209 lb (94.8 kg)   Temp Readings from Last 3 Encounters:  11/08/17 98 F (36.7 C) (Oral)  10/24/17 97.8 F (36.6 C) (Oral)  09/16/17 98.9 F (37.2 C) (Oral)   BP Readings from Last 3 Encounters:  11/08/17 104/67  11/05/17 (!) 144/90  10/24/17 122/72   Pulse Readings from Last 3 Encounters:  11/08/17 94  11/05/17 98  10/24/17 83    Constitutional: Well-developed, well-nourished, and in no distress.   HENT: Head: Normocephalic and atraumatic.  Mouth/Throat: No oropharyngeal exudate. Mucosa moist. Eyes: Pupils are equal, round, and reactive to light. Conjunctivae are normal. No scleral icterus.  Neck: Normal range of motion. Neck supple. No JVD present.  Cardiovascular: Normal rate, regular rhythm and normal heart sounds.  Exam reveals no gallop and no friction rub.   No murmur heard. Pulmonary/Chest: Effort normal and breath sounds normal. No respiratory distress. No wheezes.No rales.  Abdominal: Soft. Bowel sounds are normal. No distension. There is no tenderness. There is no guarding.  Musculoskeletal: No edema or tenderness.  Lymphadenopathy: No cervical, axillary or supraclavicular adenopathy.  Neurological: Alert and oriented to person, place, and time. No cranial nerve deficit.  Skin: Skin is warm and dry. No rash noted. No erythema. No pallor.  Psychiatric: Affect and judgment normal.   Labs No visits with results within 3 Day(s) from this visit.  Latest known visit with results is:  Appointment on 10/30/2017  Component Date Value Ref  Range Status  . WBC 10/30/2017 8.9  4.0 - 10.5 K/uL Final  . RBC 10/30/2017 4.27  3.87 - 5.11 MIL/uL Final  . Hemoglobin 10/30/2017 13.7  12.0 - 15.0 g/dL Final  . HCT 10/30/2017 42.3  36.0 - 46.0 % Final  . MCV 10/30/2017 99.1  78.0 - 100.0 fL Final  . MCH 10/30/2017 32.1  26.0 - 34.0 pg Final  . MCHC 10/30/2017 32.4  30.0 - 36.0 g/dL Final  . RDW 10/30/2017 14.6  11.5 - 15.5 % Final  . Platelets 10/30/2017 311  150 - 400 K/uL Final  . Neutrophils Relative % 10/30/2017 65  % Final  . Neutro Abs 10/30/2017 5.8  1.7 - 7.7 K/uL Final  . Lymphocytes Relative 10/30/2017 23  % Final  . Lymphs Abs 10/30/2017 2.0  0.7 - 4.0 K/uL Final  . Monocytes Relative 10/30/2017 10  % Final  . Monocytes Absolute 10/30/2017 0.9  0.1 - 1.0 K/uL Final  . Eosinophils Relative 10/30/2017 2  % Final  . Eosinophils Absolute 10/30/2017 0.2  0.0 - 0.7 K/uL Final  . Basophils Relative 10/30/2017 0  % Final  . Basophils Absolute 10/30/2017 0.0  0.0 - 0.1 K/uL Final   Performed at [facility], [address] [place], [place] [address]  . Sodium 10/30/2017 137  135 - 145 mmol/L Final  . Potassium 10/30/2017 4.5  3.5 - 5.1 mmol/L Final  . Chloride 10/30/2017 100* 101 - 111 mmol/L Final  . CO2 10/30/2017 29  22 - 32 mmol/L Final  . Glucose, Bld 10/30/2017 174* 65 - 99 mg/dL Final  . BUN 10/30/2017 13  6 - 20 mg/dL Final  . Creatinine, Ser 10/30/2017 1.84* 0.44 - 1.00 mg/dL Final  . Calcium 10/30/2017 9.7  8.9 - 10.3 mg/dL Final  . Total Protein 10/30/2017 9.1* 6.5 - 8.1 g/dL Final  . Albumin 10/30/2017 4.3  3.5 - 5.0 g/dL Final  . AST 10/30/2017 29  15 - 41 U/L Final  . ALT 10/30/2017 28  14 - 54 U/L Final  . Alkaline Phosphatase 10/30/2017 113  38 - 126 U/L Final  . Total Bilirubin 10/30/2017 0.4  0.3 - 1.2 mg/dL Final  . GFR calc non Af Amer 10/30/2017 26* >60 mL/min Final  . GFR calc Af Amer 10/30/2017 30* >60 mL/min Final   Comment: (NOTE) The eGFR has been calculated using the CKD EPI equation. This  calculation has not been validated in all clinical situations. eGFR's persistently <60 mL/min signify possible Chronic Kidney Disease.   Georgiann Hahn gap 10/30/2017 8  5 - 15 Final   Performed at [facility], [address], [place], [place] [address]  . Kappa free light chain 10/30/2017 54.6* 3.3 - 19.4 mg/L Final  . Lamda free light chains 10/30/2017 16.4  5.7 - 26.3 mg/L Final  . Kappa, lamda light chain ratio 10/30/2017 3.33* 0.26 - 1.65 Final   Comment: (NOTE) Performed At: [facility] [place], [place] [id number] Rush Farmer MD [id number] Performed at [facility], [address] [place], [place] [address]   . Beta-2 Microglobulin 10/30/2017 3.1* 0.6 - 2.4 mg/L Final   Comment: (NOTE) Siemens Immulite 2000 Immunochemiluminometric assay (ICMA) Values obtained with different assay methods or kits cannot be used interchangeably. Results cannot be interpreted as absolute evidence of the presence or absence of malignant disease. Performed At: [facility] [address]  [place], [place] [id number] Rush Farmer MD [id number] Performed at [facility], [address] [place], [place] [address]   . IgG (Immunoglobin G), Serum 10/30/2017 2,334* 700 - 1,600 mg/dL Final  . IgA 10/30/2017 273  64 - 422 mg/dL Final  . IgM (Immunoglobulin M), Srm 10/30/2017 146  26 - 217 mg/dL Final  . Total Protein ELP 10/30/2017 8.7* 6.0 - 8.5 g/dL Corrected  . Albumin SerPl Elph-Mcnc 10/30/2017 4.2  2.9 - 4.4 g/dL Corrected  . Alpha 1 10/30/2017 0.2  0.0 - 0.4 g/dL Corrected  . Alpha2 Glob SerPl Elph-Mcnc 10/30/2017 0.9  0.4 - 1.0 g/dL Corrected  . B-Globulin SerPl Elph-Mcnc 10/30/2017 1.4* 0.7 - 1.3 g/dL Corrected  . Gamma Glob SerPl Elph-Mcnc 10/30/2017 2.0* 0.4 - 1.8 g/dL Corrected  . M Protein SerPl Elph-Mcnc 10/30/2017 1.5* Not Observed g/dL Corrected  . Globulin, Total 10/30/2017 4.5* 2.2 - 3.9 g/dL Corrected  . Albumin/Glob SerPl 10/30/2017 1.0  0.7 - 1.7  Corrected  . IFE 1 10/30/2017 Comment   Corrected   Comment: (NOTE) Immunofixation shows IgG monoclonal protein with kappa light chain specificity. Please note that samples from patients receiving DARZALEX(R) (daratumumab) treatment can appear as an "IgG kappa" and mask a complete response. If this patient is receiving DARA, this IFE assay interference can be removed by ordering test number [id number]-"Immunofixation, Daratumumab-Specific, Serum" and submitting a new sample for testing or by calling the lab to add this test to the current sample.   . Please Note 10/30/2017 Comment   Corrected   Comment: (NOTE) Protein electrophoresis scan will follow via computer, mail, or courier delivery. Performed At: [facility] [place], [place] [id number] Rush Farmer MD [id number] Performed at [facility], [address] [place], [place] [address]   . Iron 10/30/2017 59  28 - 170 ug/dL Final  . TIBC 10/30/2017 440  250 - 450 ug/dL Final  . Saturation Ratios 10/30/2017 13  10.4 - 31.8 % Final  . UIBC 10/30/2017 381  ug/dL Final   Performed at [facility], [place] [address] [place], [place] [address]  . Ferritin 10/30/2017 23  11 - 307 ng/mL Final   Performed at [facility], [place] [address], [place], [place] [address]     Pathology Orders Placed This Encounter  Procedures  . CBC with Differential/Platelet    Standing Status:   Future    Standing Expiration Date:   11/09/2018  . Comprehensive metabolic panel    Standing Status:   Future    Standing Expiration Date:   11/09/2018  . Lactate dehydrogenase    Standing Status:   Future    Standing Expiration Date:   11/09/2018  . Protein electrophoresis, serum    Standing Status:   Future    Standing Expiration Date:   11/09/2018  . Ferritin    Standing Status:   Future    Standing Expiration Date:   11/09/2018  . IgG, IgA, IgM    Standing Status:   Future    Standing Expiration Date:   11/09/2018  .  Beta 2 microglobulin, serum    Standing Status:   Future    Standing Expiration Date:   11/09/2018  . Kappa/lambda light chains    Standing Status:   Future    Standing Expiration Date:   11/09/2018       Zoila Shutter MD

## 2017-11-09 ENCOUNTER — Encounter: Payer: Self-pay | Admitting: Family Medicine

## 2017-11-09 DIAGNOSIS — R52 Pain, unspecified: Secondary | ICD-10-CM | POA: Insufficient documentation

## 2017-11-09 DIAGNOSIS — G4700 Insomnia, unspecified: Secondary | ICD-10-CM | POA: Insufficient documentation

## 2017-11-09 NOTE — Assessment & Plan Note (Signed)
Uncontrolled Sleep hygiene reviewed and written information offered also. Prescription sent for  medication needed. Trazodone added, review in 5 weeks, also to help with anxiety and depresssion

## 2017-11-09 NOTE — Assessment & Plan Note (Addendum)
[name] is reminded of the importance of commitment to daily physical activity for 30 minutes or more, as able and the need to limit carbohydrate intake to 30 to 60 grams per meal to help with blood sugar control.   The need to take medication as prescribed, test blood sugar as directed, and to call between visits if there is a concern that blood sugar is uncontrolled is also discussed.   [name] is reminded of the importance of daily foot exam, annual eye examination, and good blood sugar, blood pressure and cholesterol control. Updated lab needed at/ before next visit.   Diabetic Labs Latest Ref Rng & Units 10/30/2017 10/24/2017 09/13/2017 08/30/2017 08/02/2017  HbA1c <5.7 % of total Hgb - - - - -  Microalbumin <2.0 mg/dL - - - - -  Micro/Creat Ratio 0.0 - 30.0 mg/g - - - - -  Chol <200 mg/dL - - - - -  HDL >50 mg/dL - - - - -  Calc LDL mg/dL (calc) - - - - -  Triglycerides <150 mg/dL - - - - -  Creatinine 0.44 - 1.00 mg/dL 1.84(H) 1.59(H) 1.47(H) 1.41(H) 1.75(H)   BP/Weight 11/08/2017 11/05/2017 10/24/2017 09/16/2017 09/10/2017 06/20/2109 12/11/5668  Systolic BP 141 030 131 438 887 579 728  Diastolic BP 67 90 72 73 70 67 83  Wt. (Lbs) 209.56 211.04 - 209 209 208 204.4  BMI 33.82 34.06 - 33.73 33.73 33.57 32.99   Foot/eye exam completion dates Latest Ref Rng & Units 04/17/2017 03/18/2017  Eye Exam No Retinopathy No Retinopathy -  Foot exam Order - - -  Foot Form Completion - - Done

## 2017-11-09 NOTE — Assessment & Plan Note (Signed)
Hyperlipidemia:Low fat diet discussed and encouraged.   Lipid Panel  Lab Results  Component Value Date   CHOL 181 06/12/2017   HDL 47 (L) 06/12/2017   LDLCALC 104 (H) 06/12/2017   TRIG 188 (H) 06/12/2017   CHOLHDL 3.9 06/12/2017   Not at goal Updated lab needed at/ before next visit.

## 2017-11-09 NOTE — Assessment & Plan Note (Signed)
Unchanged. Patient re-educated about  the importance of commitment to a  minimum of 150 minutes of exercise per week.  The importance of healthy food choices with portion control discussed.  Goals set by the patient for the next several months.   Weight /BMI 11/08/2017 11/05/2017 09/16/2017  WEIGHT 209 lb 9 oz 211 lb 0.6 oz 209 lb  HEIGHT 5\' 6"  5\' 6"  5\' 6"   BMI 33.82 kg/m2 34.06 kg/m2 33.73 kg/m2

## 2017-11-09 NOTE — Assessment & Plan Note (Signed)
Uncontrolled at voisit, pt reports anger and stress and lack of sleep , will re evaluate before med adjustment DASH diet and commitment to daily physical activity for a minimum of 30 minutes discussed and encouraged, as a part of hypertension management. The importance of attaining a healthy weight is also discussed.  BP/Weight 11/08/2017 11/05/2017 10/24/2017 09/16/2017 09/10/2017 8/67/6720 02/13/7095  Systolic BP 283 662 947 654 650 354 656  Diastolic BP 67 90 72 73 70 67 83  Wt. (Lbs) 209.56 211.04 - 209 209 208 204.4  BMI 33.82 34.06 - 33.73 33.73 33.57 32.99

## 2017-11-09 NOTE — Assessment & Plan Note (Signed)
C/o increased generalized pain and spasm, start low dose muscle relaxant

## 2017-11-11 ENCOUNTER — Encounter (HOSPITAL_COMMUNITY): Payer: Self-pay | Admitting: Internal Medicine

## 2017-11-11 NOTE — Progress Notes (Unsigned)
Referral to Dr Norma Fredrickson for 2nd opinion.  Appointment scheduled for 12/16/17 11AM (10:45 arrival).   New patient paper work, including appointment details,  will be mailed to patient from [facility].

## 2017-11-12 ENCOUNTER — Ambulatory Visit: Payer: Medicare Other | Admitting: Family Medicine

## 2017-11-17 DIAGNOSIS — R0902 Hypoxemia: Secondary | ICD-10-CM | POA: Diagnosis not present

## 2017-11-22 ENCOUNTER — Other Ambulatory Visit: Payer: Self-pay

## 2017-11-22 ENCOUNTER — Inpatient Hospital Stay (HOSPITAL_COMMUNITY): Payer: Medicare Other | Attending: Hematology

## 2017-11-22 ENCOUNTER — Encounter (HOSPITAL_COMMUNITY): Payer: Self-pay

## 2017-11-22 VITALS — BP 116/66 | HR 90 | Temp 98.1°F | Resp 18

## 2017-11-22 DIAGNOSIS — C9000 Multiple myeloma not having achieved remission: Secondary | ICD-10-CM | POA: Insufficient documentation

## 2017-11-22 DIAGNOSIS — Z79899 Other long term (current) drug therapy: Secondary | ICD-10-CM | POA: Insufficient documentation

## 2017-11-22 DIAGNOSIS — D508 Other iron deficiency anemias: Secondary | ICD-10-CM

## 2017-11-22 MED ORDER — SODIUM CHLORIDE 0.9 % IV SOLN
750.0000 mg | Freq: Once | INTRAVENOUS | Status: AC
  Administered 2017-11-22: 750 mg via INTRAVENOUS
  Filled 2017-11-22: qty 15

## 2017-11-22 MED ORDER — SODIUM CHLORIDE 0.9 % IV SOLN
INTRAVENOUS | Status: DC
  Administered 2017-11-22: 13:00:00 via INTRAVENOUS

## 2017-11-22 NOTE — Progress Notes (Signed)
Tolerated infusion w/o adverse reaction.  Alert, in no distress.  VSS.  Discharged ambulatory in c/o family.  

## 2017-11-27 ENCOUNTER — Other Ambulatory Visit: Payer: Self-pay | Admitting: Family Medicine

## 2017-11-29 ENCOUNTER — Inpatient Hospital Stay (HOSPITAL_COMMUNITY): Payer: Medicare Other

## 2017-11-29 ENCOUNTER — Other Ambulatory Visit: Payer: Self-pay

## 2017-11-29 ENCOUNTER — Encounter (HOSPITAL_COMMUNITY): Payer: Self-pay

## 2017-11-29 ENCOUNTER — Other Ambulatory Visit: Payer: Self-pay | Admitting: Family Medicine

## 2017-11-29 VITALS — BP 125/60 | HR 80 | Temp 97.9°F | Resp 18

## 2017-11-29 DIAGNOSIS — D508 Other iron deficiency anemias: Secondary | ICD-10-CM

## 2017-11-29 DIAGNOSIS — Z79899 Other long term (current) drug therapy: Secondary | ICD-10-CM | POA: Diagnosis not present

## 2017-11-29 DIAGNOSIS — C9000 Multiple myeloma not having achieved remission: Secondary | ICD-10-CM | POA: Diagnosis not present

## 2017-11-29 MED ORDER — FERRIC CARBOXYMALTOSE 750 MG/15ML IV SOLN
750.0000 mg | Freq: Once | INTRAVENOUS | Status: AC
  Administered 2017-11-29: 750 mg via INTRAVENOUS
  Filled 2017-11-29: qty 15

## 2017-11-29 MED ORDER — ALPRAZOLAM 0.5 MG PO TABS: 0.5000 mg | ORAL_TABLET | Freq: Two times a day (BID) | ORAL | 1 refills | Status: DC

## 2017-11-29 MED ORDER — SODIUM CHLORIDE 0.9 % IV SOLN
INTRAVENOUS | Status: DC
  Administered 2017-11-29: 13:00:00 via INTRAVENOUS

## 2017-11-29 NOTE — Progress Notes (Signed)
Injectafer given today per orders. Patient tolerated it well without problems. Vitals stable and discharged home from clinic ambulatory. Follow up as scheduled.  Printed Dr. Walden Field office visit note and gave the patient per her request.  She is confused about her diagnosis. Instructed her to follow up with the MD next visit.

## 2017-11-29 NOTE — Patient Instructions (Signed)
[place] at [facility] Discharge Instructions Injectafer given Follow up as scheduled.  Thank you for choosing [place] at [facility] to provide your oncology and hematology care.  To afford each patient quality time with our provider, please arrive at least 15 minutes before your scheduled appointment time.   If you have a lab appointment with the [facility] please come in thru the  Main Entrance and check in at the main information desk  You need to re-schedule your appointment should you arrive 10 or more minutes late.  We strive to give you quality time with our providers, and arriving late affects you and other patients whose appointments are after yours.  Also, if you no show three or more times for appointments you may be dismissed from the clinic at the providers discretion.     Again, thank you for choosing [facility].  Our hope is that these requests will decrease the amount of time that you wait before being seen by our physicians.       _____________________________________________________________  Should you have questions after your [facility] to [facility], please contact our office at [phone] between the hours of 8:30 a.m. and 4:30 p.m.  Voicemails left after 4:30 p.m. will not be returned until the following business day.  For prescription refill requests, have your pharmacy contact our office.       Resources For Cancer Patients and their Caregivers ? American Cancer Society: Can assist with transportation, wigs, general needs, runs Look Good Feel Better.        985-629-1568 ? Cancer Care: Provides financial assistance, online support groups, medication/co-pay assistance.  1-800-813-HOPE 662-478-3579) ? [facility] Assists [place] cancer patients and their families through emotional , educational and financial support.  917-467-8562 ? [place] DSS Where to  apply for food stamps, Medicaid and utility assistance. 715-769-3198 ? [facility]: Transportation to medical appointments. (956)734-1526 ? Social Security Administration: May apply for disability if have a Stage IV cancer. 786-451-6969 870-326-5259 ? [employer], Disability and Transit Services: Assists with nutrition, care and transit needs. [place] Support Programs:   > Cancer Support Group  2nd Tuesday of the month 1pm-2pm, Journey Room   > Creative Journey  3rd Tuesday of the month 1130am-1pm, Journey Room

## 2017-12-07 ENCOUNTER — Other Ambulatory Visit: Payer: Self-pay | Admitting: Family Medicine

## 2017-12-10 ENCOUNTER — Encounter: Payer: Self-pay | Admitting: Gastroenterology

## 2017-12-11 ENCOUNTER — Other Ambulatory Visit: Payer: Self-pay | Admitting: Family Medicine

## 2017-12-11 ENCOUNTER — Ambulatory Visit (INDEPENDENT_AMBULATORY_CARE_PROVIDER_SITE_OTHER): Payer: Medicare Other | Admitting: Family Medicine

## 2017-12-11 ENCOUNTER — Other Ambulatory Visit: Payer: Self-pay

## 2017-12-11 ENCOUNTER — Encounter: Payer: Self-pay | Admitting: Family Medicine

## 2017-12-11 VITALS — BP 108/70 | HR 96 | Resp 14 | Ht 66.0 in | Wt 206.1 lb

## 2017-12-11 DIAGNOSIS — F329 Major depressive disorder, single episode, unspecified: Secondary | ICD-10-CM | POA: Diagnosis not present

## 2017-12-11 DIAGNOSIS — E785 Hyperlipidemia, unspecified: Secondary | ICD-10-CM | POA: Diagnosis not present

## 2017-12-11 DIAGNOSIS — I10 Essential (primary) hypertension: Secondary | ICD-10-CM

## 2017-12-11 DIAGNOSIS — F419 Anxiety disorder, unspecified: Secondary | ICD-10-CM

## 2017-12-11 DIAGNOSIS — F5104 Psychophysiologic insomnia: Secondary | ICD-10-CM | POA: Diagnosis not present

## 2017-12-11 DIAGNOSIS — E1121 Type 2 diabetes mellitus with diabetic nephropathy: Secondary | ICD-10-CM | POA: Diagnosis not present

## 2017-12-11 DIAGNOSIS — E559 Vitamin D deficiency, unspecified: Secondary | ICD-10-CM

## 2017-12-11 DIAGNOSIS — F32A Depression, unspecified: Secondary | ICD-10-CM

## 2017-12-11 DIAGNOSIS — G4733 Obstructive sleep apnea (adult) (pediatric): Secondary | ICD-10-CM

## 2017-12-11 DIAGNOSIS — Z1231 Encounter for screening mammogram for malignant neoplasm of breast: Secondary | ICD-10-CM

## 2017-12-11 MED ORDER — ALPRAZOLAM 0.5 MG PO TABS: ORAL_TABLET | ORAL | 5 refills | Status: DC

## 2017-12-11 NOTE — Patient Instructions (Addendum)
F/U in 4 months, call if you need me before  Please schedule mammogram at checkout  Please take urine cup and order for microalbuminuria with you and drop off at the lab when you have the specimen  Xanax dose is reduced to one at bedtime   Fasting labs when you next get blood at the cancer center , we will give the order at check out, hBA1c, lipid, TSH and Vit D

## 2017-12-13 ENCOUNTER — Encounter: Payer: Self-pay | Admitting: Family Medicine

## 2017-12-13 NOTE — Assessment & Plan Note (Signed)
No medication change at this time

## 2017-12-13 NOTE — Assessment & Plan Note (Signed)
Needs to be re assesses for definitive treatment,as she c/o chronic fatigue and excess daytime sleepiness, however , electing to defer currently on financial grounds

## 2017-12-13 NOTE — Assessment & Plan Note (Signed)
Controlled, no change in medication DASH diet and commitment to daily physical activity for a minimum of 30 minutes discussed and encouraged, as a part of hypertension management. The importance of attaining a healthy weight is also discussed.  BP/Weight 12/11/2017 11/29/2017 11/22/2017 11/08/2017 11/05/2017 0/34/9179 06/16/567  Systolic BP 794 801 655 374 827 078 675  Diastolic BP 70 60 66 67 90 72 73  Wt. (Lbs) 206.12 - - 209.56 211.04 - 209  BMI 33.27 - - 33.82 34.06 - 33.73

## 2017-12-13 NOTE — Assessment & Plan Note (Signed)
Controlled, no change in medication Ms. Christiana is reminded of the importance of commitment to daily physical activity for 30 minutes or more, as able and the need to limit carbohydrate intake to 30 to 60 grams per meal to help with blood sugar control.   The need to take medication as prescribed, test blood sugar as directed, and to call between visits if there is a concern that blood sugar is uncontrolled is also discussed.   Ms. Vinas is reminded of the importance of daily foot exam, annual eye examination, and good blood sugar, blood pressure and cholesterol control.  Diabetic Labs Latest Ref Rng & Units 10/30/2017 10/24/2017 09/13/2017 08/30/2017 08/02/2017  HbA1c <5.7 % of total Hgb - - - - -  Microalbumin <2.0 mg/dL - - - - -  Micro/Creat Ratio 0.0 - 30.0 mg/g - - - - -  Chol <200 mg/dL - - - - -  HDL >50 mg/dL - - - - -  Calc LDL mg/dL (calc) - - - - -  Triglycerides <150 mg/dL - - - - -  Creatinine 0.44 - 1.00 mg/dL 1.84(H) 1.59(H) 1.47(H) 1.41(H) 1.75(H)   BP/Weight 12/11/2017 11/29/2017 11/22/2017 11/08/2017 11/05/2017 0/35/5974 06/17/3843  Systolic BP 364 680 321 224 825 003 704  Diastolic BP 70 60 66 67 90 72 73  Wt. (Lbs) 206.12 - - 209.56 211.04 - 209  BMI 33.27 - - 33.82 34.06 - 33.73   Foot/eye exam completion dates Latest Ref Rng & Units 04/17/2017 03/18/2017  Eye Exam No Retinopathy No Retinopathy -  Foot exam Order - - -  Foot Form Completion - - Done

## 2017-12-13 NOTE — Assessment & Plan Note (Signed)
Well controlled on current medication, continue same Sleep hygiene reviewed and written information offered also. Prescription sent for  medication needed.

## 2017-12-13 NOTE — Assessment & Plan Note (Signed)
Improved. Patient re-educated about  the importance of commitment to a  minimum of 150 minutes of exercise per week.  The importance of healthy food choices with portion control discussed. Encouraged to start a food diary, count calories and to consider  joining a support group. Sample diet sheets offered. Goals set by the patient for the next several months.   Weight /BMI 12/11/2017 11/08/2017 11/05/2017  WEIGHT 206 lb 1.9 oz 209 lb 9 oz 211 lb 0.6 oz  HEIGHT 5\' 6"  5\' 6"  5\' 6"   BMI 33.27 kg/m2 33.82 kg/m2 34.06 kg/m2

## 2017-12-13 NOTE — Progress Notes (Signed)
Cheryl Abbott     MRN: [id number]      DOB: [birth year]   HPI Cheryl Abbott is here for follow up and re-evaluation of chronic medical conditions, medication management and review of any available recent lab and radiology data.  Preventive health is updated, specifically  Cancer screening and Immunization.   Questions or concerns regarding consultations or procedures which the PT has had in the interim are  addressed. The PT denies any adverse reactions to current medications since the last visit.  States her new medication for sleep is extremely effective and that she is sleeping very well. We discussed the s/e of xanax as being additive and also as memory loss , and she has agreed to reduce the dose to once daily Concerned about her children leaving work to go to her oncology appointments, reports one of her children as being in denial, and the other as having failing health  There are no specific complaints  Denies polyuria, polydipsia, blurred vision , or hypoglycemic episodes.   ROS Denies recent fever or chills. Denies sinus pressure, nasal congestion, ear pain or sore throat. Denies chest congestion, productive cough or wheezing. Denies chest pains, palpitations and leg swelling Denies abdominal pain, nausea, vomiting,diarrhea or constipation.   Denies dysuria, frequency, hesitancy or incontinence. Denies uncontrolled  joint pain, swelling and limitation in mobility.States that the topical rub is working well for her knee pain Denies headaches, seizures, numbness, or tingling. Denies depression, anxiety or insomnia. Denies skin break down or rash.   PE  BP 108/70 (BP Location: Left Arm, Patient Position: Sitting, Cuff Size: Large)   Pulse 96   Resp 14   Ht 5\' 6"  (1.676 m)   Wt 206 lb 1.9 oz (93.5 kg)   SpO2 93%   BMI 33.27 kg/m   Patient alert and oriented and in no cardiopulmonary distress.  HEENT: No facial asymmetry, EOMI,   oropharynx pink and moist.  Neck supple  no JVD, no mass.  Chest: Clear to auscultation bilaterally.  CVS: S1, S2 no murmurs, no S3.Regular rate.  ABD: Soft non tender.   Ext: No edema  MS: Adequate ROM spine, shoulders, hips and knees.  Skin: Intact, no ulcerations or rash noted.  Psych: Good eye contact, normal affect. Memory intact not anxious or depressed appearing.  CNS: CN 2-12 intact, power,  normal throughout.no focal deficits noted.   Assessment & Plan  Hypertension goal BP (blood pressure) < 130/80 Controlled, no change in medication DASH diet and commitment to daily physical activity for a minimum of 30 minutes discussed and encouraged, as a part of hypertension management. The importance of attaining a healthy weight is also discussed.  BP/Weight 12/11/2017 11/29/2017 11/22/2017 11/08/2017 11/05/2017 1/85/6314 02/15/262  Systolic BP 785 885 027 741 287 867 672  Diastolic BP 70 60 66 67 90 72 73  Wt. (Lbs) 206.12 - - 209.56 211.04 - 209  BMI 33.27 - - 33.82 34.06 - 33.73       Anxiety and depression No medication change at this time   Morbid obesity (HCC) Improved. Patient re-educated about  the importance of commitment to a  minimum of 150 minutes of exercise per week.  The importance of healthy food choices with portion control discussed. Encouraged to start a food diary, count calories and to consider  joining a support group. Sample diet sheets offered. Goals set by the patient for the next several months.   Weight /BMI 12/11/2017 11/08/2017 11/05/2017  WEIGHT 206 lb  1.9 oz 209 lb 9 oz 211 lb 0.6 oz  HEIGHT 5\' 6"  5\' 6"  5\' 6"   BMI 33.27 kg/m2 33.82 kg/m2 34.06 kg/m2      Sleep apnea Needs to be re assesses for definitive treatment,as she c/o chronic fatigue and excess daytime sleepiness, however , electing to defer currently on financial grounds  Insomnia Well controlled on current medication, continue same Sleep hygiene reviewed and written information offered also. Prescription sent for   medication needed.   Type 2 diabetes with nephropathy (HCC) Controlled, no change in medication Cheryl Abbott is reminded of the importance of commitment to daily physical activity for 30 minutes or more, as able and the need to limit carbohydrate intake to 30 to 60 grams per meal to help with blood sugar control.   The need to take medication as prescribed, test blood sugar as directed, and to call between visits if there is a concern that blood sugar is uncontrolled is also discussed.   Cheryl Abbott is reminded of the importance of daily foot exam, annual eye examination, and good blood sugar, blood pressure and cholesterol control.  Diabetic Labs Latest Ref Rng & Units 10/30/2017 10/24/2017 09/13/2017 08/30/2017 08/02/2017  HbA1c <5.7 % of total Hgb - - - - -  Microalbumin <2.0 mg/dL - - - - -  Micro/Creat Ratio 0.0 - 30.0 mg/g - - - - -  Chol <200 mg/dL - - - - -  HDL >50 mg/dL - - - - -  Calc LDL mg/dL (calc) - - - - -  Triglycerides <150 mg/dL - - - - -  Creatinine 0.44 - 1.00 mg/dL 1.84(H) 1.59(H) 1.47(H) 1.41(H) 1.75(H)   BP/Weight 12/11/2017 11/29/2017 11/22/2017 11/08/2017 11/05/2017 1/82/9937 06/16/9676  Systolic BP 938 101 751 025 852 778 242  Diastolic BP 70 60 66 67 90 72 73  Wt. (Lbs) 206.12 - - 209.56 211.04 - 209  BMI 33.27 - - 33.82 34.06 - 33.73   Foot/eye exam completion dates Latest Ref Rng & Units 04/17/2017 03/18/2017  Eye Exam No Retinopathy No Retinopathy -  Foot exam Order - - -  Foot Form Completion - - Done

## 2017-12-17 DIAGNOSIS — I739 Peripheral vascular disease, unspecified: Secondary | ICD-10-CM | POA: Diagnosis not present

## 2017-12-17 DIAGNOSIS — R0902 Hypoxemia: Secondary | ICD-10-CM | POA: Diagnosis not present

## 2017-12-19 DIAGNOSIS — E1121 Type 2 diabetes mellitus with diabetic nephropathy: Secondary | ICD-10-CM | POA: Diagnosis not present

## 2017-12-19 IMAGING — CT CT RENAL STONE PROTOCOL
2 of 3 series · 16 of 46 positions shown, 18 images · non-contrast
Comparison: Abdominal radiograph 06/07/2015, CT 01/15/2015

CLINICAL DATA: Flank pain. Bilateral lower abdominal pain for 1
week. History of cystoscopy with stone extraction 06/28/2015, 12
days prior

EXAM:
CT ABDOMEN AND PELVIS WITHOUT CONTRAST
TECHNIQUE: Multidetector CT imaging of the abdomen and pelvis was performed
following the standard protocol without IV contrast.

[Series 2: standard/full over (age)lbs 5.0 · axial · 0.76mm/px · z∈[-518,-134]mm · 13 of 89 slices shown, 15 images]
[im 6/89  soft-tissue]
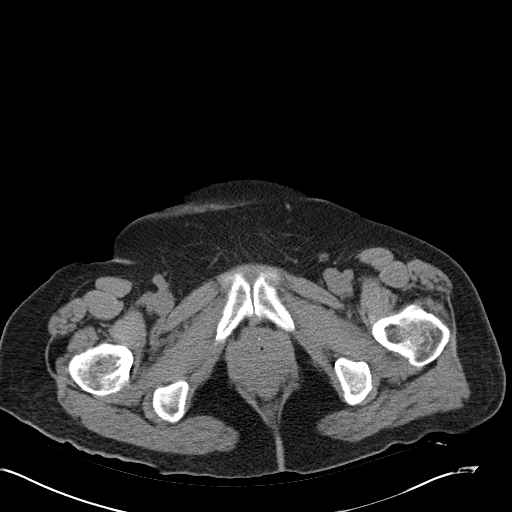
[im 6/89  bone]
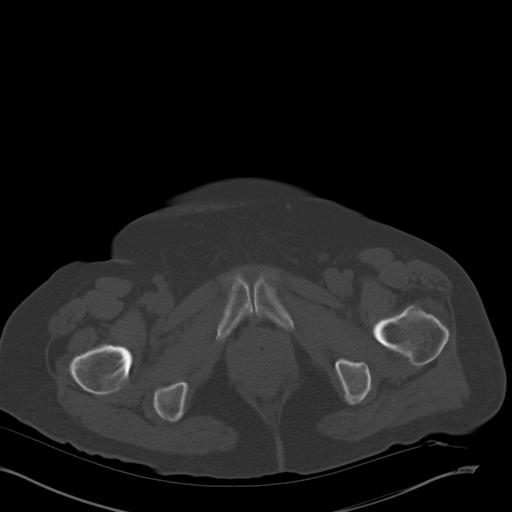
[im 12/89  soft-tissue]
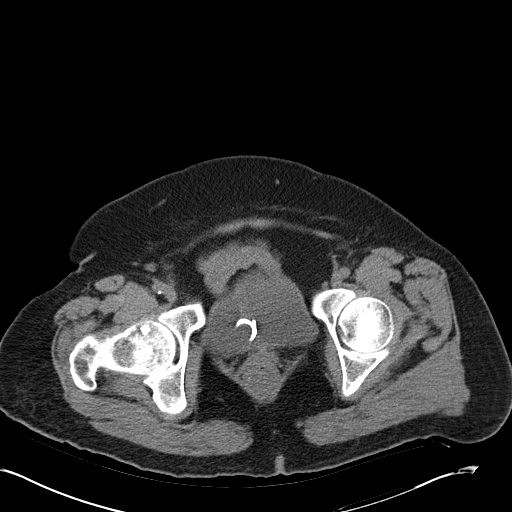
[im 18/89  soft-tissue]
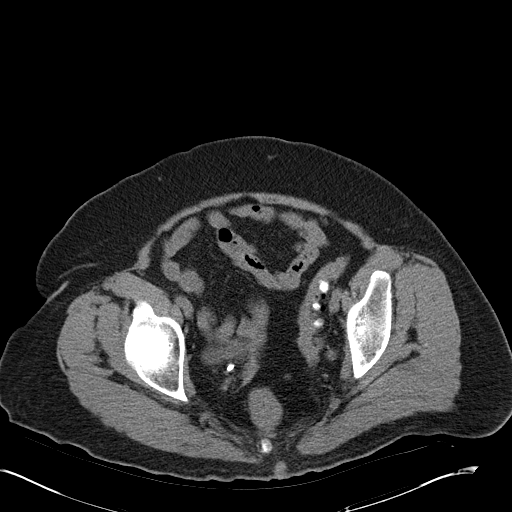
[im 26/89  soft-tissue]
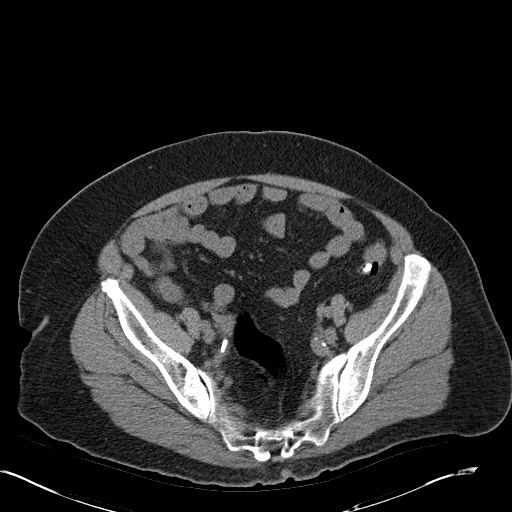
[im 32/89  soft-tissue]
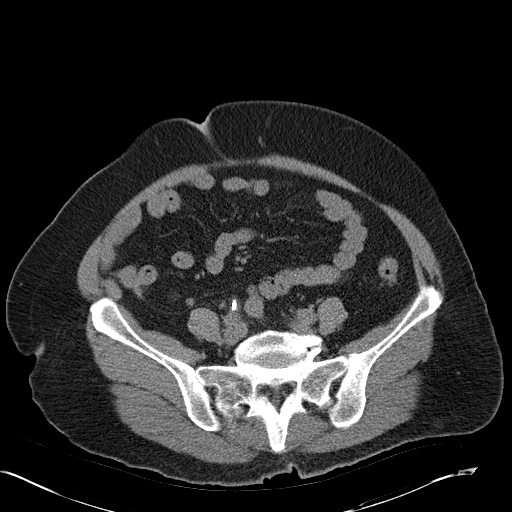
[im 37/89  soft-tissue]
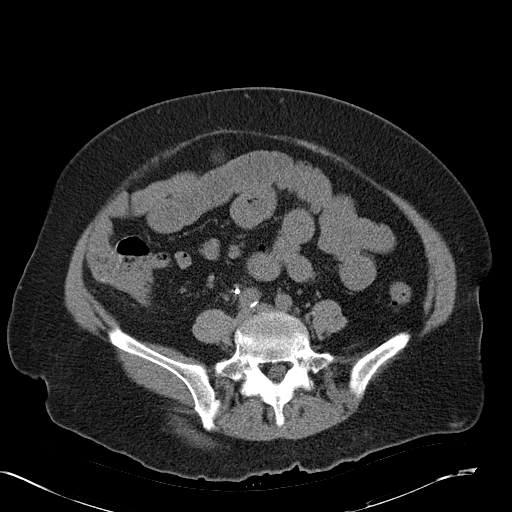
[im 46/89  soft-tissue]
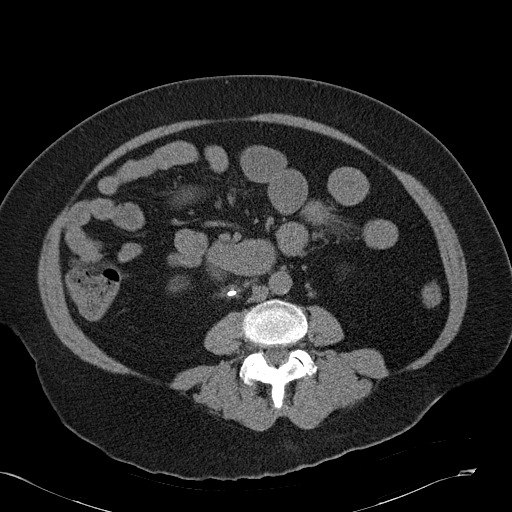
[im 52/89  soft-tissue]
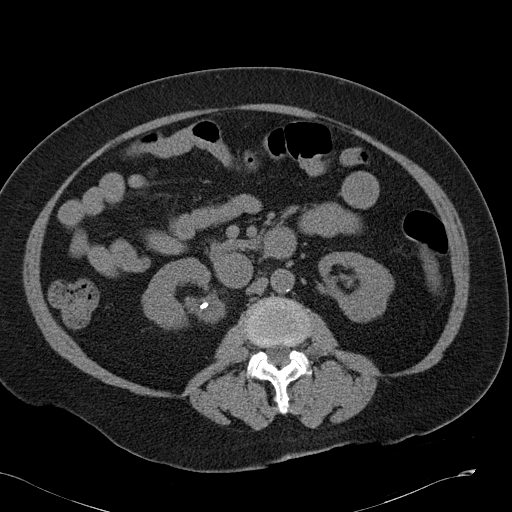
[im 57/89  soft-tissue]
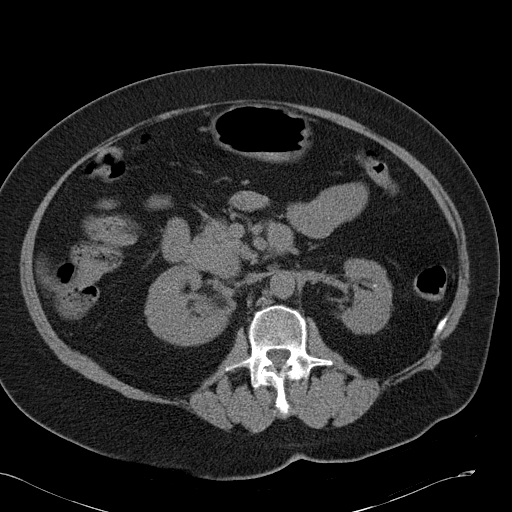
[im 57/89  bone]
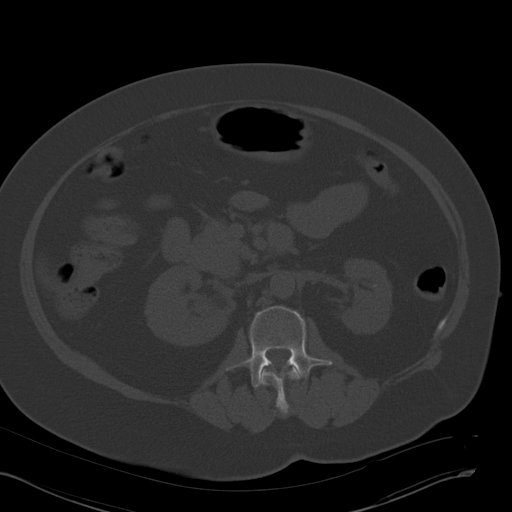
[im 63/89  soft-tissue]
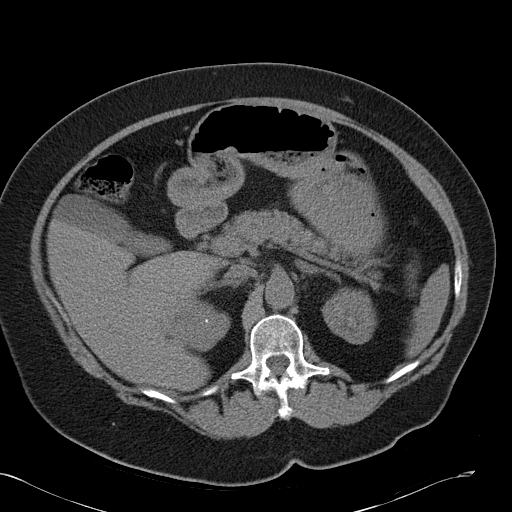
[im 71/89  soft-tissue]
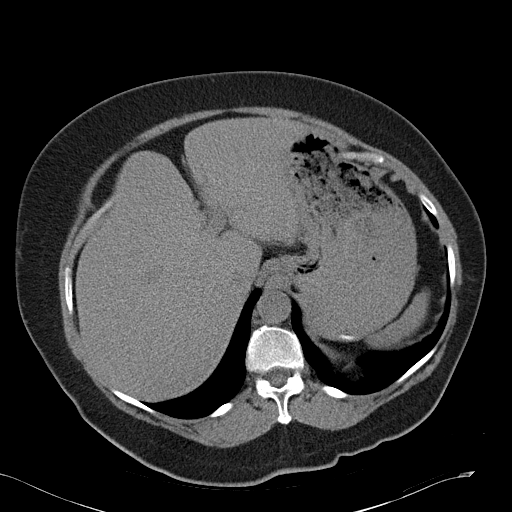
[im 77/89  soft-tissue]
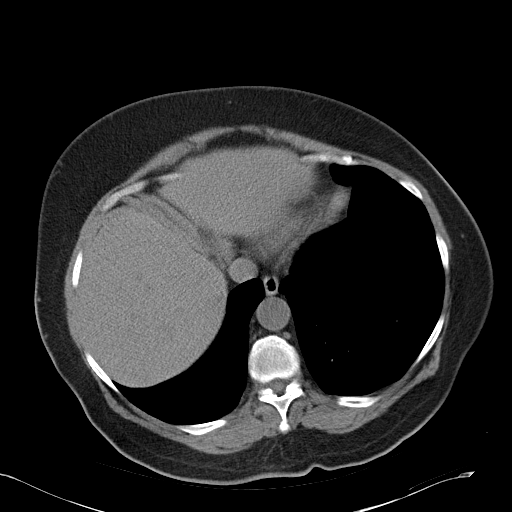
[im 83/89  soft-tissue]
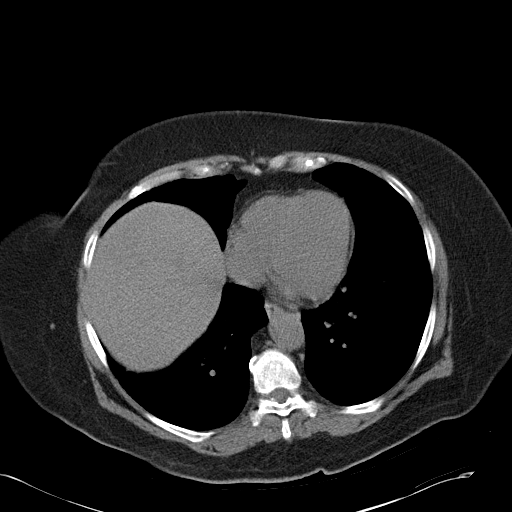

[Series 3: mpr coronal · coronal · 0.88mm/px · 3 of 111 slices shown]
[im 37/111  soft-tissue]
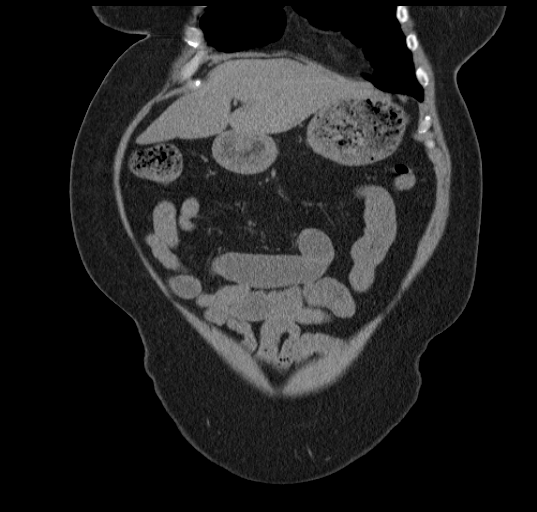
[im 49/111  soft-tissue]
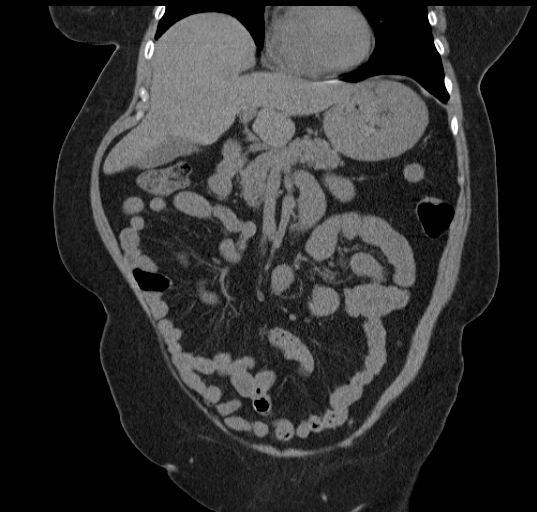
[im 62/111  soft-tissue]
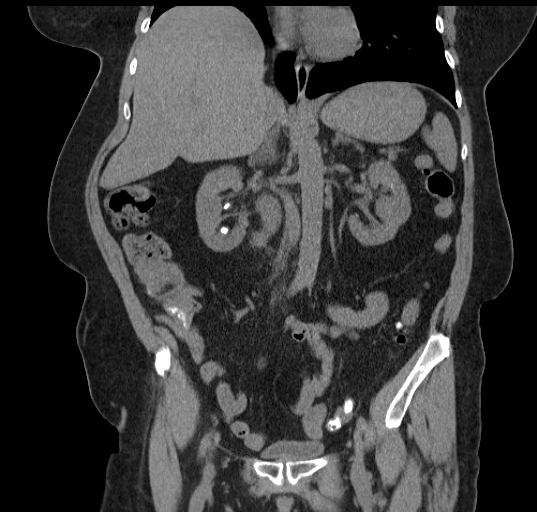

[16 of 46 positions shown; findings below may reference images not displayed]

FINDINGS: Lower chest: Minimal linear scarring in the right middle and lower
lobes. Lung bases otherwise clear. Stable elevation of right
hemidiaphragm.

Liver: No focal lesion allowing for lack contrast.

Hepatobiliary: Gallbladder physiologically distended, no calcified
stone. No biliary dilatation.

Pancreas: No ductal dilatation or inflammation.

Spleen: Normal.

Adrenal glands: No nodule.

Kidneys: Right ureteral stent, proximal portion in the renal pelvis,
distal portion in the bladder. No stone fragments about the stent or
within the ureter. Mild prominence of the right renal pelvis and
proximal ureter with adjacent fat stranding. Small stones or stone
fragments in the right lower, interpolar, and upper kidney measuring
from 4-7 mm. No stones in the urinary bladder. Cortical scarring and
multiple nonobstructing left renal calculi are unchanged. There is a
4 mm stone in the left pelvis, localizing to the distal left ureter,
however no proximal hydroureteronephrosis. There is no left
perinephric stranding.

Stomach/Bowel: Stomach physiologically distended with ingested
contents. There are no dilated or thickened small bowel loops.
High-density material in the cecum is likely related to ingested
contents. Small volume of stool throughout the colon without colonic
wall thickening. Scattered diverticulosis of the distal colon from
the splenic flexure through the sigmoid, no diverticulitis. The
appendix is normal.

Vascular/Lymphatic: No retroperitoneal adenopathy. Abdominal aorta
is normal in caliber. Mild atherosclerosis of the aorta without
aneurysm.

Reproductive: Uterus surgically absent.  No adnexal mass.

Bladder: Physiologically distended. No bladder stone. No stone
fragments. No wall thickening.

Other: No free air, free fluid, or intra-abdominal fluid collection.
Small fat containing umbilical hernia.

Musculoskeletal: There are no acute or suspicious osseous
abnormalities.
IMPRESSION: 1. Right ureteral stent in place. Mild prominence of the right renal
pelvis and peripelvic soft tissue stranding, likely secondary to
recent procedure. No stone fragments along the course of the
ureteral stent. Nonobstructing stones/stone fragments remain in the
right kidney, largest measuring 7 mm.
2. Apparent 4 mm stone in the distal left ureter, however no
proximal hydroureteronephrosis or inflammatory changes about the
left kidney. This is likely nonobstructing. Additional
nonobstructing left renal calculi are seen.
3. Colonic diverticulosis without diverticulitis.

## 2017-12-20 LAB — MICROALBUMIN, URINE: Microalb, Ur: 2.3 mg/dL

## 2017-12-24 DIAGNOSIS — I10 Essential (primary) hypertension: Secondary | ICD-10-CM | POA: Diagnosis not present

## 2017-12-24 DIAGNOSIS — E559 Vitamin D deficiency, unspecified: Secondary | ICD-10-CM | POA: Diagnosis not present

## 2017-12-24 DIAGNOSIS — E1121 Type 2 diabetes mellitus with diabetic nephropathy: Secondary | ICD-10-CM | POA: Diagnosis not present

## 2017-12-24 DIAGNOSIS — E785 Hyperlipidemia, unspecified: Secondary | ICD-10-CM | POA: Diagnosis not present

## 2017-12-25 LAB — LIPID PANEL
Cholesterol: 164 mg/dL (ref <200)
HDL: 43 mg/dL — ABNORMAL LOW (ref >50)
LDL Cholesterol (Calc): 94 mg/dL (calc)
Non-HDL Cholesterol (Calc): 121 mg/dL (calc) (ref <130)
Total CHOL/HDL Ratio: 3.8 (calc) (ref <5.0)
Triglycerides: 161 mg/dL — ABNORMAL HIGH (ref <150)

## 2017-12-25 LAB — TSH: TSH: 1.17 mIU/L (ref 0.40–4.50)

## 2017-12-25 LAB — HEMOGLOBIN A1C
Hgb A1c MFr Bld: 7.3 % of total Hgb — ABNORMAL HIGH (ref <5.7)
Mean Plasma Glucose: 163 (calc)
eAG (mmol/L): 9 (calc)

## 2017-12-25 LAB — VITAMIN D 25 HYDROXY (VIT D DEFICIENCY, FRACTURES): Vit D, 25-Hydroxy: 20 ng/mL — ABNORMAL LOW (ref 30–100)

## 2017-12-27 ENCOUNTER — Telehealth: Payer: Self-pay | Admitting: General Practice

## 2017-12-27 ENCOUNTER — Other Ambulatory Visit (HOSPITAL_COMMUNITY): Payer: Medicare Other

## 2017-12-27 ENCOUNTER — Inpatient Hospital Stay (HOSPITAL_COMMUNITY): Payer: Medicare Other | Attending: Hematology

## 2017-12-27 DIAGNOSIS — C9000 Multiple myeloma not having achieved remission: Secondary | ICD-10-CM | POA: Diagnosis not present

## 2017-12-27 DIAGNOSIS — D472 Monoclonal gammopathy: Secondary | ICD-10-CM

## 2017-12-27 LAB — CBC WITH DIFFERENTIAL/PLATELET
Basophils Absolute: 0 10*3/uL (ref 0.0–0.1)
Basophils Relative: 0 %
Eosinophils Absolute: 0.3 10*3/uL (ref 0.0–0.7)
Eosinophils Relative: 3 %
HCT: 42.1 % (ref 36.0–46.0)
Hemoglobin: 13.9 g/dL (ref 12.0–15.0)
Lymphocytes Relative: 18 %
Lymphs Abs: 1.5 10*3/uL (ref 0.7–4.0)
MCH: 32.7 pg (ref 26.0–34.0)
MCHC: 33 g/dL (ref 30.0–36.0)
MCV: 99.1 fL (ref 78.0–100.0)
Monocytes Absolute: 0.6 10*3/uL (ref 0.1–1.0)
Monocytes Relative: 7 %
Neutro Abs: 6 10*3/uL (ref 1.7–7.7)
Neutrophils Relative %: 72 %
Platelets: 249 10*3/uL (ref 150–400)
RBC: 4.25 MIL/uL (ref 3.87–5.11)
RDW: 16.1 % — ABNORMAL HIGH (ref 11.5–15.5)
WBC: 8.5 10*3/uL (ref 4.0–10.5)

## 2017-12-27 LAB — FERRITIN: Ferritin: 381 ng/mL — ABNORMAL HIGH (ref 11–307)

## 2017-12-27 LAB — COMPREHENSIVE METABOLIC PANEL
ALT: 19 U/L (ref 0–44)
AST: 22 U/L (ref 15–41)
Albumin: 4 g/dL (ref 3.5–5.0)
Alkaline Phosphatase: 110 U/L (ref 38–126)
Anion gap: 8 (ref 5–15)
BUN: 10 mg/dL (ref 8–23)
CO2: 26 mmol/L (ref 22–32)
Calcium: 9.4 mg/dL (ref 8.9–10.3)
Chloride: 102 mmol/L (ref 98–111)
Creatinine, Ser: 1.7 mg/dL — ABNORMAL HIGH (ref 0.44–1.00)
GFR calc Af Amer: 33 mL/min — ABNORMAL LOW (ref >60)
GFR calc non Af Amer: 29 mL/min — ABNORMAL LOW (ref >60)
Glucose, Bld: 235 mg/dL — ABNORMAL HIGH (ref 70–99)
Potassium: 3.7 mmol/L (ref 3.5–5.1)
Sodium: 136 mmol/L (ref 135–145)
Total Bilirubin: 0.5 mg/dL (ref 0.3–1.2)
Total Protein: 8.7 g/dL — ABNORMAL HIGH (ref 6.5–8.1)

## 2017-12-27 LAB — LACTATE DEHYDROGENASE: LDH: 104 U/L (ref 98–192)

## 2017-12-27 NOTE — Telephone Encounter (Signed)
Patient came in thinking she had an appointment scheduled today at 11:30 am.  Patient is on the recall list for a follow-up appt with [place] in 01/2018. She had a letter with her from [place] that states she needed to call and make a follow-up appointment with [place] for 01/2018.    However, after looking at her appt desk she does have an appointment with the [facility] today at 1:20 pm.  The patient stated she was aware of this appt at the [facility] and demanded I give her the appt card for her future appt with [place] in 03/2018, because she was in a hurry to leave our office.  I explained to the patient she needed to be scheduled to see [place] in 01/2018.  The patient became upset and started making accusations that we never get her appts right.  This is not the first time I've had to speak with the patient about her showing up on the wrong day for her appt.  I have made her an appt 04/02/18 at 2:00 pm and I have given her a card with the date and time for this appt.    Patient seems aggravated and confused today.  Routing to Dr. Oneida Alar and Dr. Moshe Cipro

## 2017-12-28 LAB — IGG, IGA, IGM
IgA: 248 mg/dL (ref 64–422)
IgG (Immunoglobin G), Serum: 2111 mg/dL — ABNORMAL HIGH (ref 700–1600)
IgM (Immunoglobulin M), Srm: 131 mg/dL (ref 26–217)

## 2017-12-28 LAB — BETA 2 MICROGLOBULIN, SERUM: Beta-2 Microglobulin: 2.7 mg/L — ABNORMAL HIGH (ref 0.6–2.4)

## 2017-12-30 ENCOUNTER — Encounter: Payer: Self-pay | Admitting: Family Medicine

## 2017-12-30 LAB — PROTEIN ELECTROPHORESIS, SERUM
A/G Ratio: 0.9 (ref 0.7–1.7)
Albumin ELP: 3.9 g/dL (ref 2.9–4.4)
Alpha-1-Globulin: 0.2 g/dL (ref 0.0–0.4)
Alpha-2-Globulin: 1 g/dL (ref 0.4–1.0)
Beta Globulin: 1.3 g/dL (ref 0.7–1.3)
Gamma Globulin: 2 g/dL — ABNORMAL HIGH (ref 0.4–1.8)
Globulin, Total: 4.5 g/dL — ABNORMAL HIGH (ref 2.2–3.9)
M-Spike, %: 1.4 g/dL — ABNORMAL HIGH
Total Protein ELP: 8.4 g/dL (ref 6.0–8.5)

## 2017-12-30 LAB — KAPPA/LAMBDA LIGHT CHAINS
Kappa free light chain: 49.3 mg/L — ABNORMAL HIGH (ref 3.3–19.4)
Kappa, lambda light chain ratio: 3.2 — ABNORMAL HIGH (ref 0.26–1.65)
Lambda free light chains: 15.4 mg/L (ref 5.7–26.3)

## 2017-12-31 NOTE — Telephone Encounter (Signed)
Noted  

## 2017-12-31 NOTE — Telephone Encounter (Signed)
SAW PT FEB 2019. WAS TO BE SEEN AUG 2019. OK TO WAIT UNTIL OCT 2019 TO BE SEEN.

## 2018-01-04 IMAGING — CT CT RENAL STONE PROTOCOL
2 of 3 series · 16 of 46 positions shown, 18 images · non-contrast
Comparison: 07/10/2015.

CLINICAL DATA: Two week history of right-sided abdominal pain

EXAM:
CT ABDOMEN AND PELVIS WITHOUT CONTRAST
TECHNIQUE: Multidetector CT imaging of the abdomen and pelvis was performed
following the standard protocol without IV contrast.

[Series 2: standard/full over (age)lbs 5.0 · axial · 0.84mm/px · z∈[+528,+938]mm · 13 of 96 slices shown, 15 images]
[im 7/96  soft-tissue]
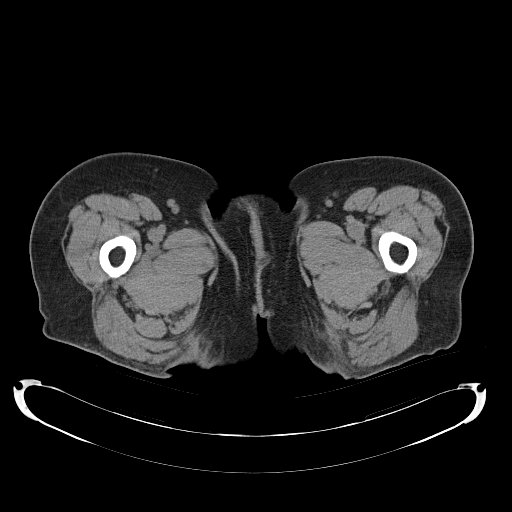
[im 7/96  bone]
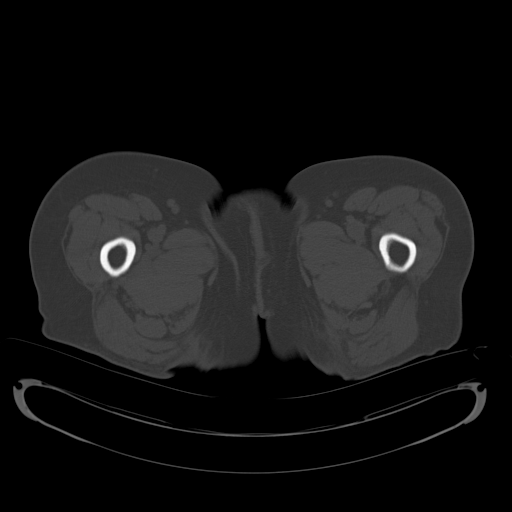
[im 13/96  soft-tissue]
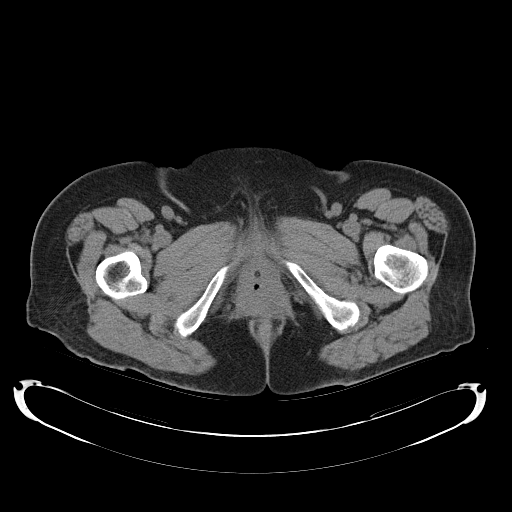
[im 19/96  soft-tissue]
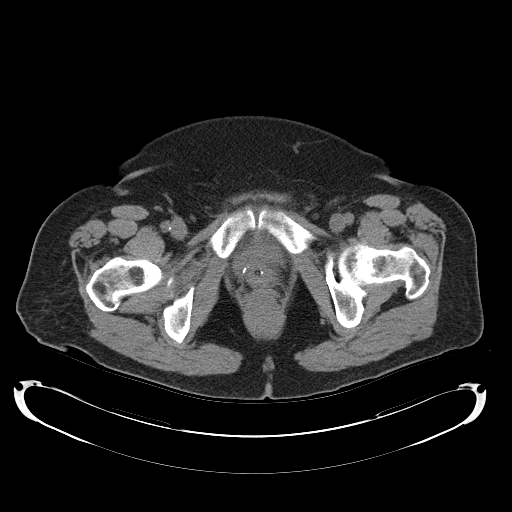
[im 28/96  soft-tissue]
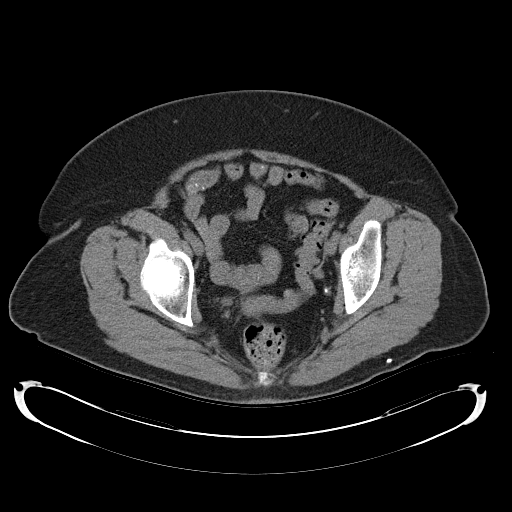
[im 34/96  soft-tissue]
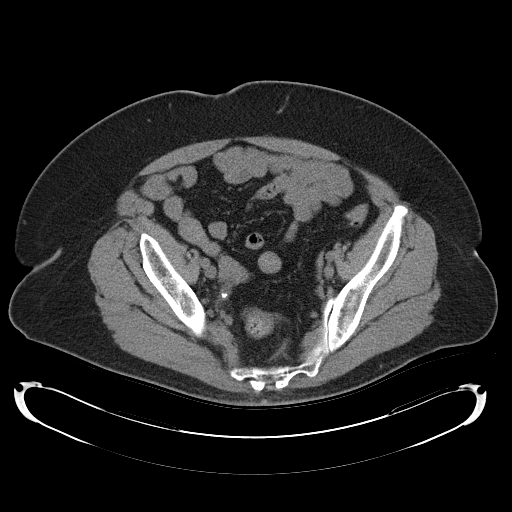
[im 40/96  soft-tissue]
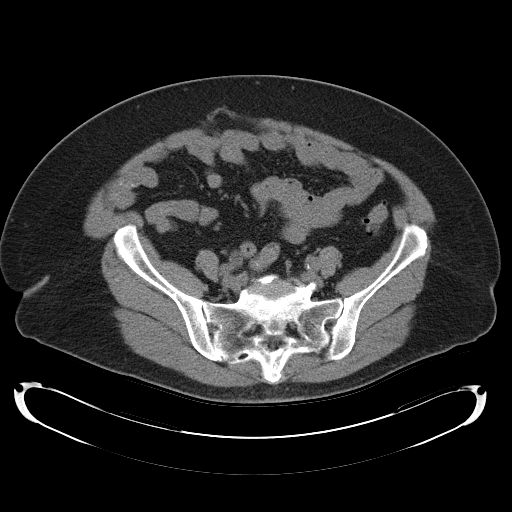
[im 50/96  soft-tissue]
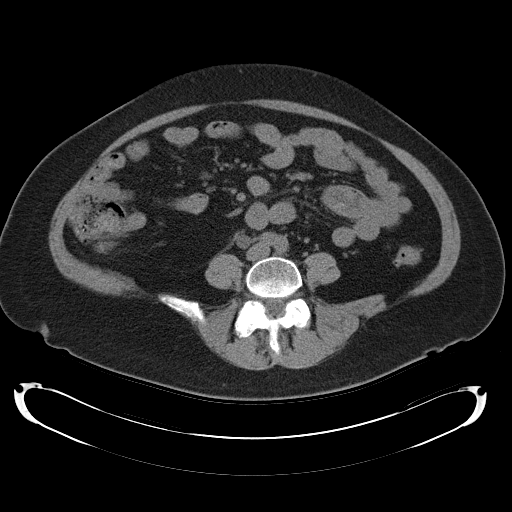
[im 56/96  soft-tissue]
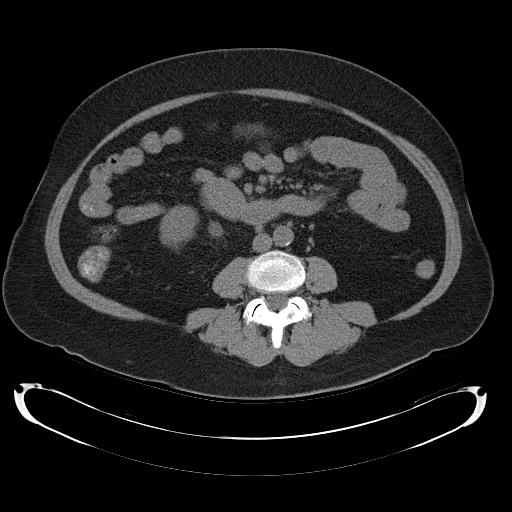
[im 62/96  soft-tissue]
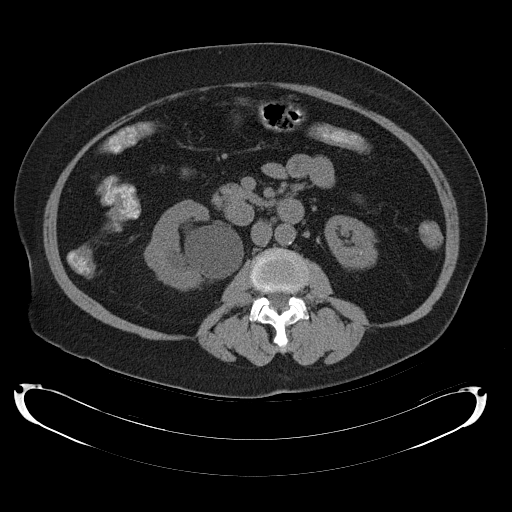
[im 62/96  bone]
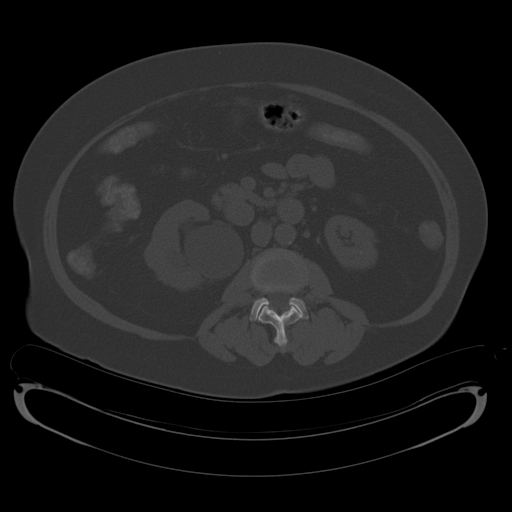
[im 68/96  soft-tissue]
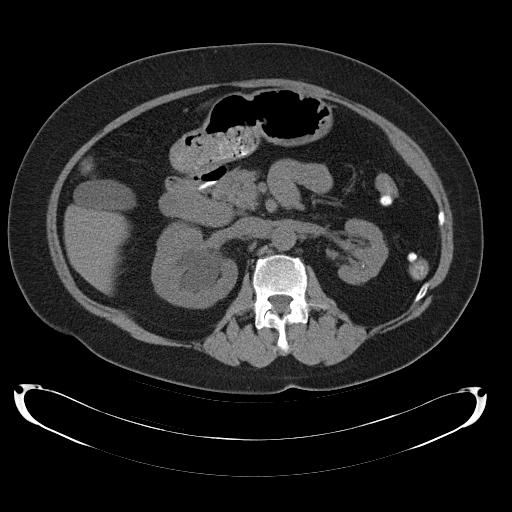
[im 77/96  soft-tissue]
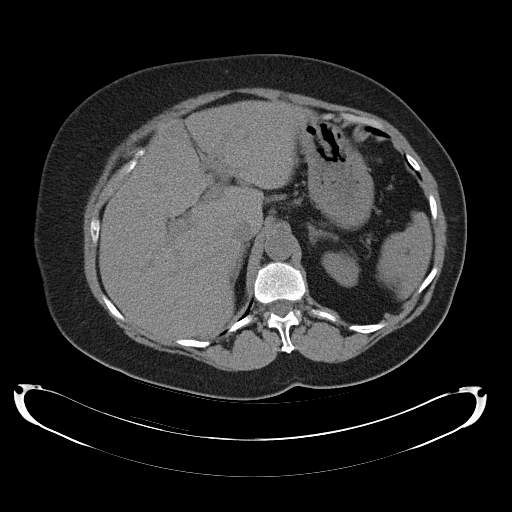
[im 83/96  soft-tissue]
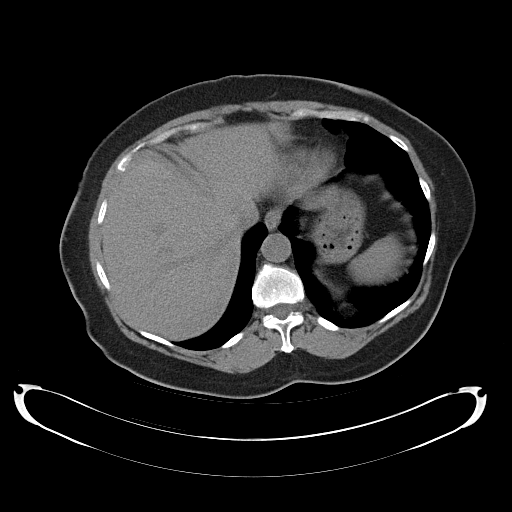
[im 89/96  soft-tissue]
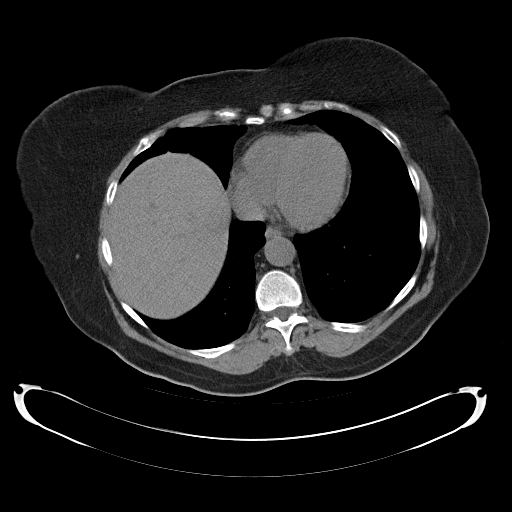

[Series 3: mpr coronal · coronal · 0.85mm/px · 3 of 102 slices shown]
[im 34/102  soft-tissue]
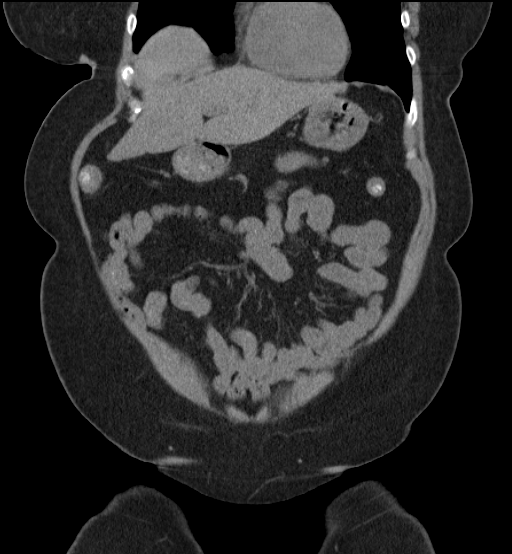
[im 45/102  soft-tissue]
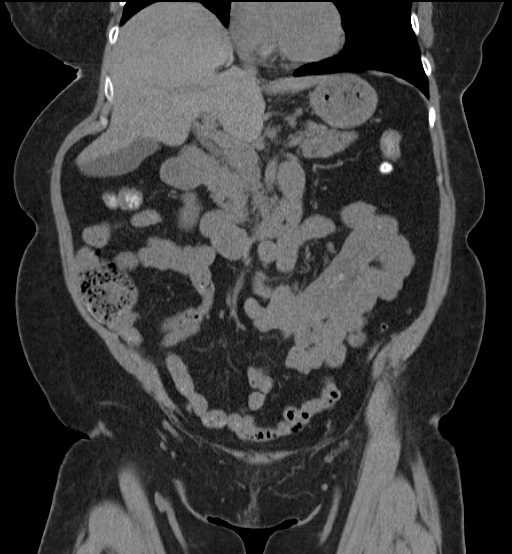
[im 57/102  soft-tissue]
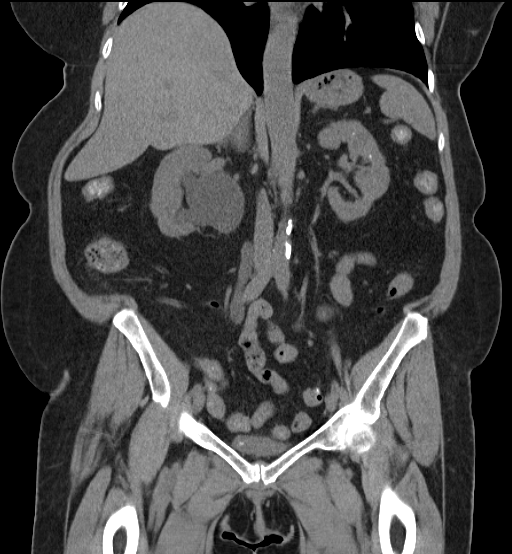

[16 of 46 positions shown; findings below may reference images not displayed]

FINDINGS: Lower chest:  Unremarkable.

Hepatobiliary: No focal abnormality in the liver on this study
without intravenous contrast. No evidence of hepatomegaly. There is
no evidence for gallstones, gallbladder wall thickening, or
pericholecystic fluid. No intrahepatic or extrahepatic biliary
dilation.

Pancreas: No focal mass lesion. No dilatation of the main duct. No
intraparenchymal cyst. No peripancreatic edema.

Spleen: No splenomegaly. No focal mass lesion.

Adrenals/Urinary Tract: No adrenal nodule or mass. 4 mm
nonobstructing stone identified upper pole right kidney. Several
other tiny 1 mm stones are seen in the right kidney. There is
moderate right hydronephrosis with right ureter dilated to the level
of the right pelvic sidewall (distal to the iliac vessels) where a
10 x 4 x 5 mm ureteral stone is identified.

Approximately 8 stones are seen in the left kidney ranging in size
from 1-3 mm. No left-sided hydronephrosis. Although the left ureter
is not dilated, a 4 x 4 x 3 mm stone is identified in the distal
left ureter about 3 cm proximal to the UVJ. Urinary bladder is
decompressed by a Foley catheter with multiple small bladder stones
evident. There may be a stone adjacent to the catheter in the
proximal urethra (see image 81 series 2).

Stomach/Bowel: Stomach is nondistended. No gastric wall thickening.
No evidence of outlet obstruction. Duodenum is normally positioned
as is the ligament of Treitz. No small bowel wall thickening. No
small bowel dilatation. The terminal ileum is normal. The appendix
is normal. Diverticular changes are noted in the left colon without
evidence of diverticulitis.

Vascular/Lymphatic: There is abdominal aortic atherosclerosis
without aneurysm. There is no gastrohepatic or hepatoduodenal
ligament lymphadenopathy. No intraperitoneal or retroperitoneal
lymphadenopathy. No pelvic sidewall lymphadenopathy.

Reproductive: Uterus is surgically absent. There is no adnexal mass.

Other: No intraperitoneal free fluid.

Musculoskeletal: Bone windows reveal no worrisome lytic or sclerotic
osseous lesions.
IMPRESSION: 1. Bilateral nephrolithiasis with moderate right
hydroureteronephrosis secondary to a 10 x 4 x 5 mm distal right
ureteral stone. 4 x 4 x 3 mm stone identified distal left ureter
down hydroureteronephrosis.
2. Bladder stones with possible ureteral stone adjacent Foley
catheter.
3. Colonic diverticulosis without diverticulitis.

## 2018-01-11 ENCOUNTER — Other Ambulatory Visit: Payer: Self-pay | Admitting: Family Medicine

## 2018-01-14 ENCOUNTER — Other Ambulatory Visit: Payer: Self-pay | Admitting: Family Medicine

## 2018-01-15 IMAGING — DX DG HIP (WITH OR WITHOUT PELVIS) 3-4V BILAT
5 series · 5 of 5 positions shown · non-contrast
Comparison: 06/07/2015

CLINICAL DATA: Fall with bilateral hip pain, left side greater than
right.

EXAM:
DG HIP (WITH OR WITHOUT PELVIS) 3-4V BILAT

[hip ap (1 of 2)]
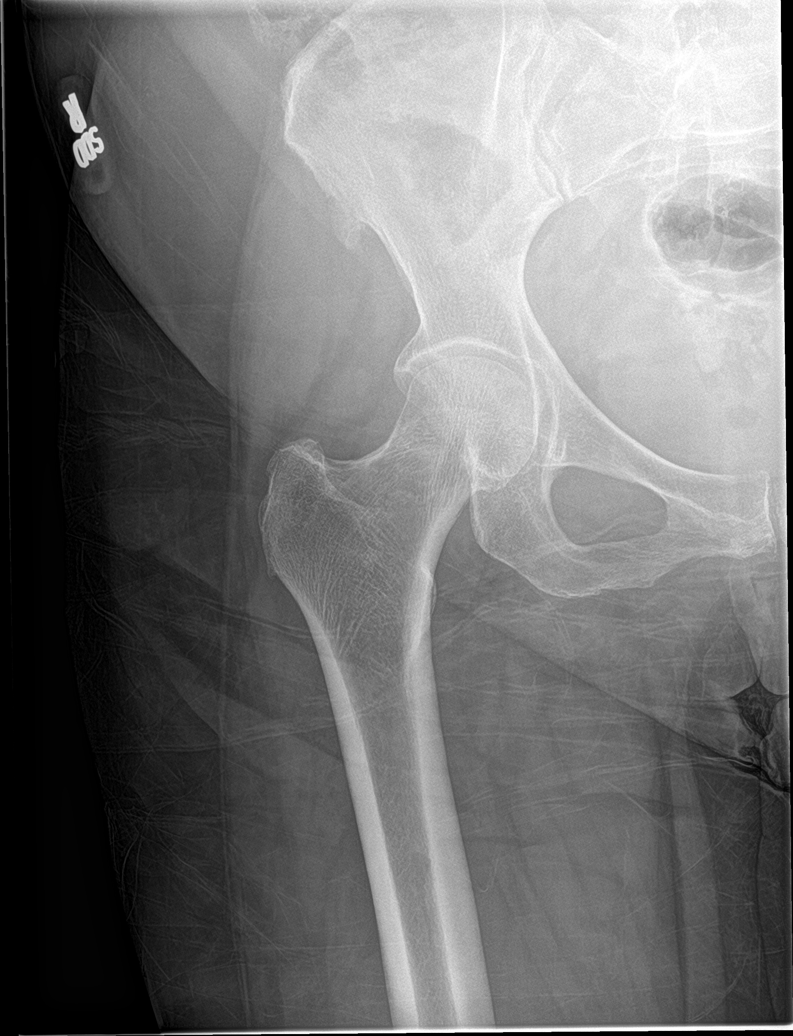

[hip lat (1 of 2)]
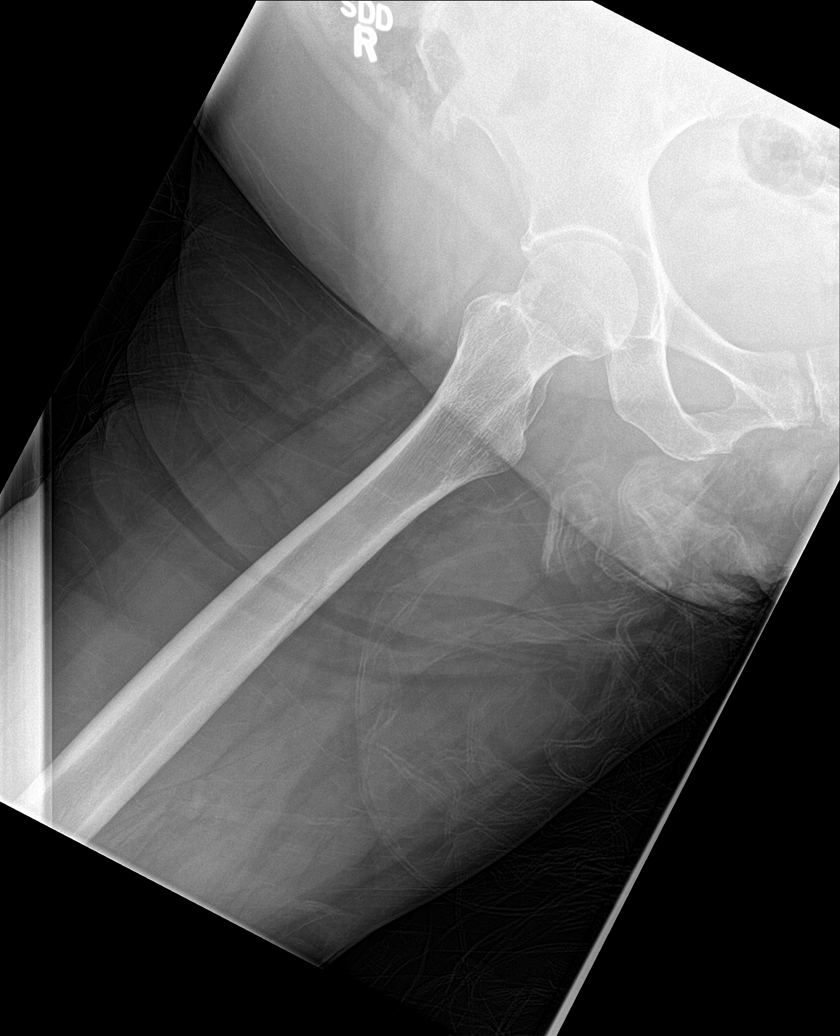

[hip ap (2 of 2)]
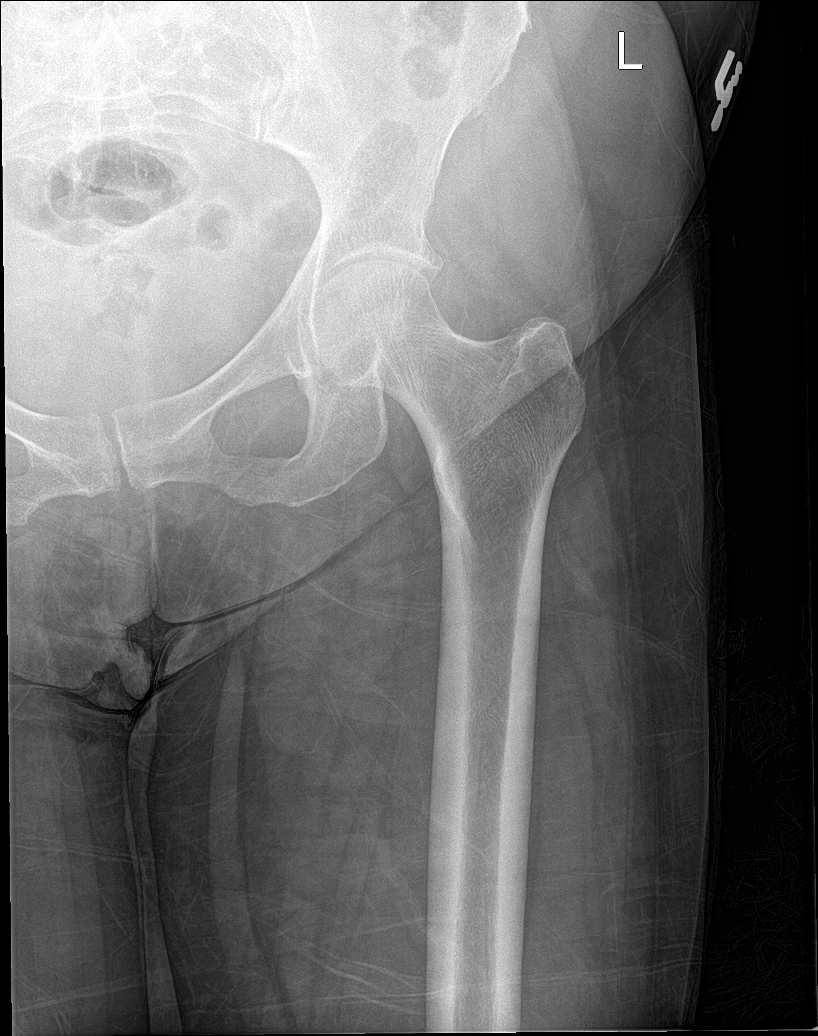

[hip lat (2 of 2)]
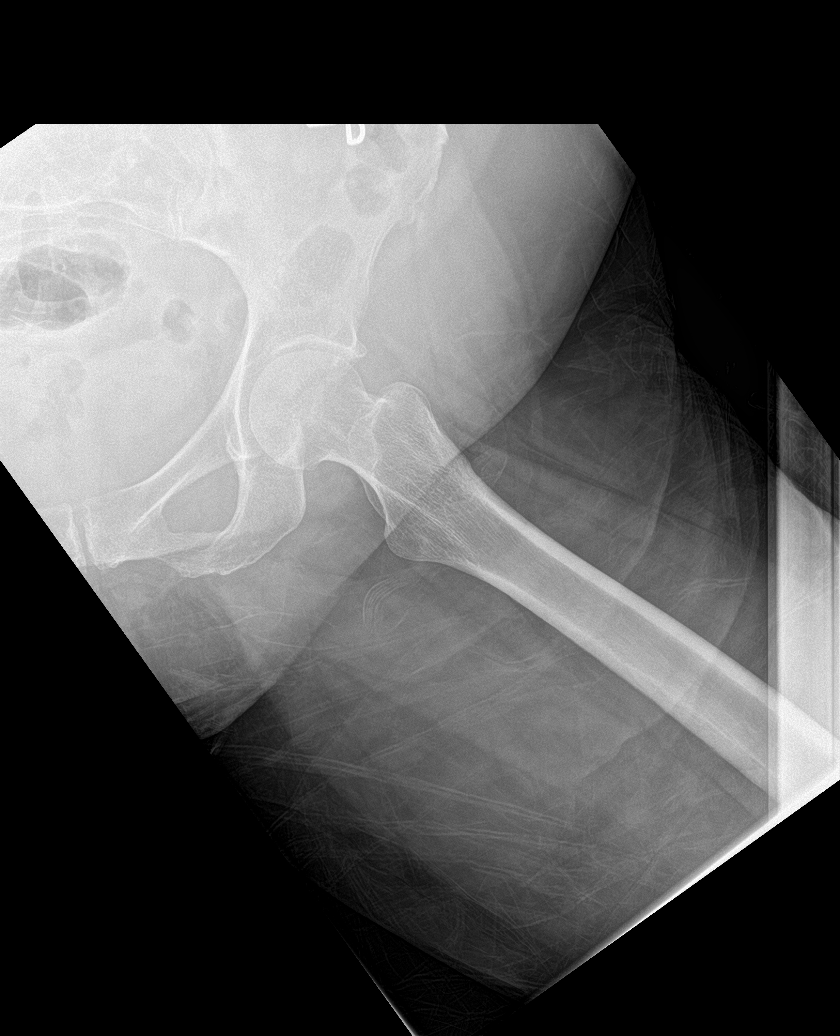

[pelvis ap]
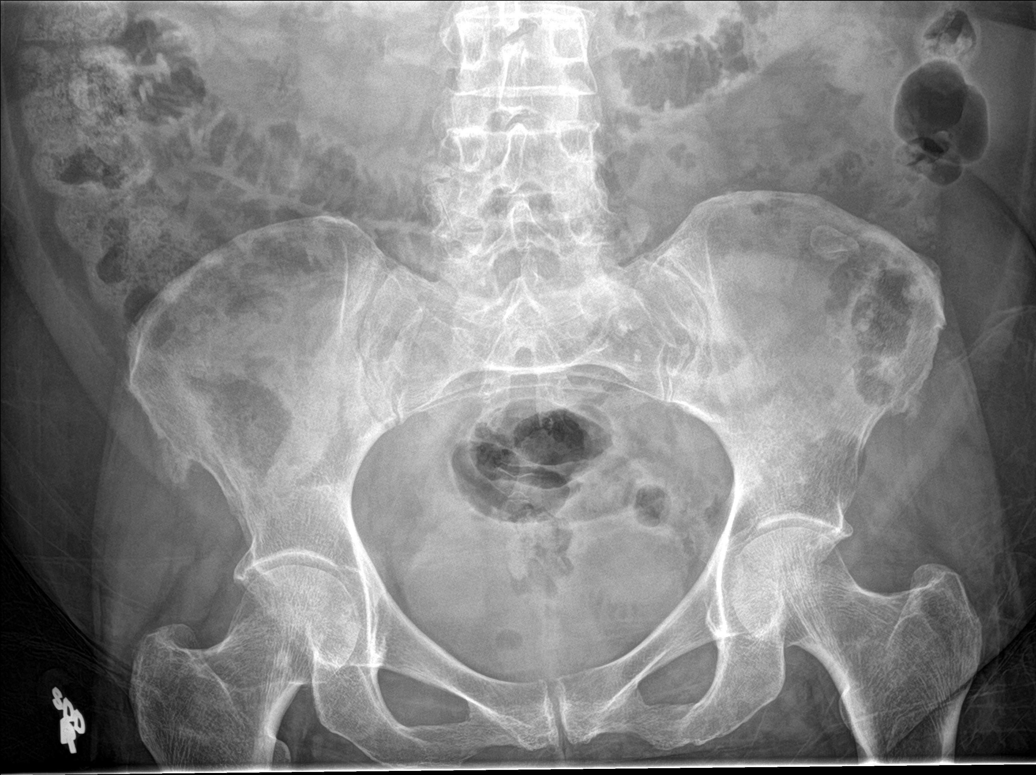

[5 of 5 positions shown; findings below may reference images not displayed]

FINDINGS: Pelvic bony ring is intact. SI joints are symmetric. Both hips are
located without a fracture. No significant joint space narrowing in
the hip joints. Stable focal sclerotic area at the junction of the
right femoral head and neck.
IMPRESSION: No acute bone abnormality to the pelvis or hips.

## 2018-01-15 IMAGING — CR DG CHEST 1V
1 series · 1 of 1 positions shown · non-contrast
Comparison: November 26, 2013

CLINICAL DATA: Chest pain and weakness.  Leukocytosis.

EXAM:
CHEST 1 VIEW

[pa]
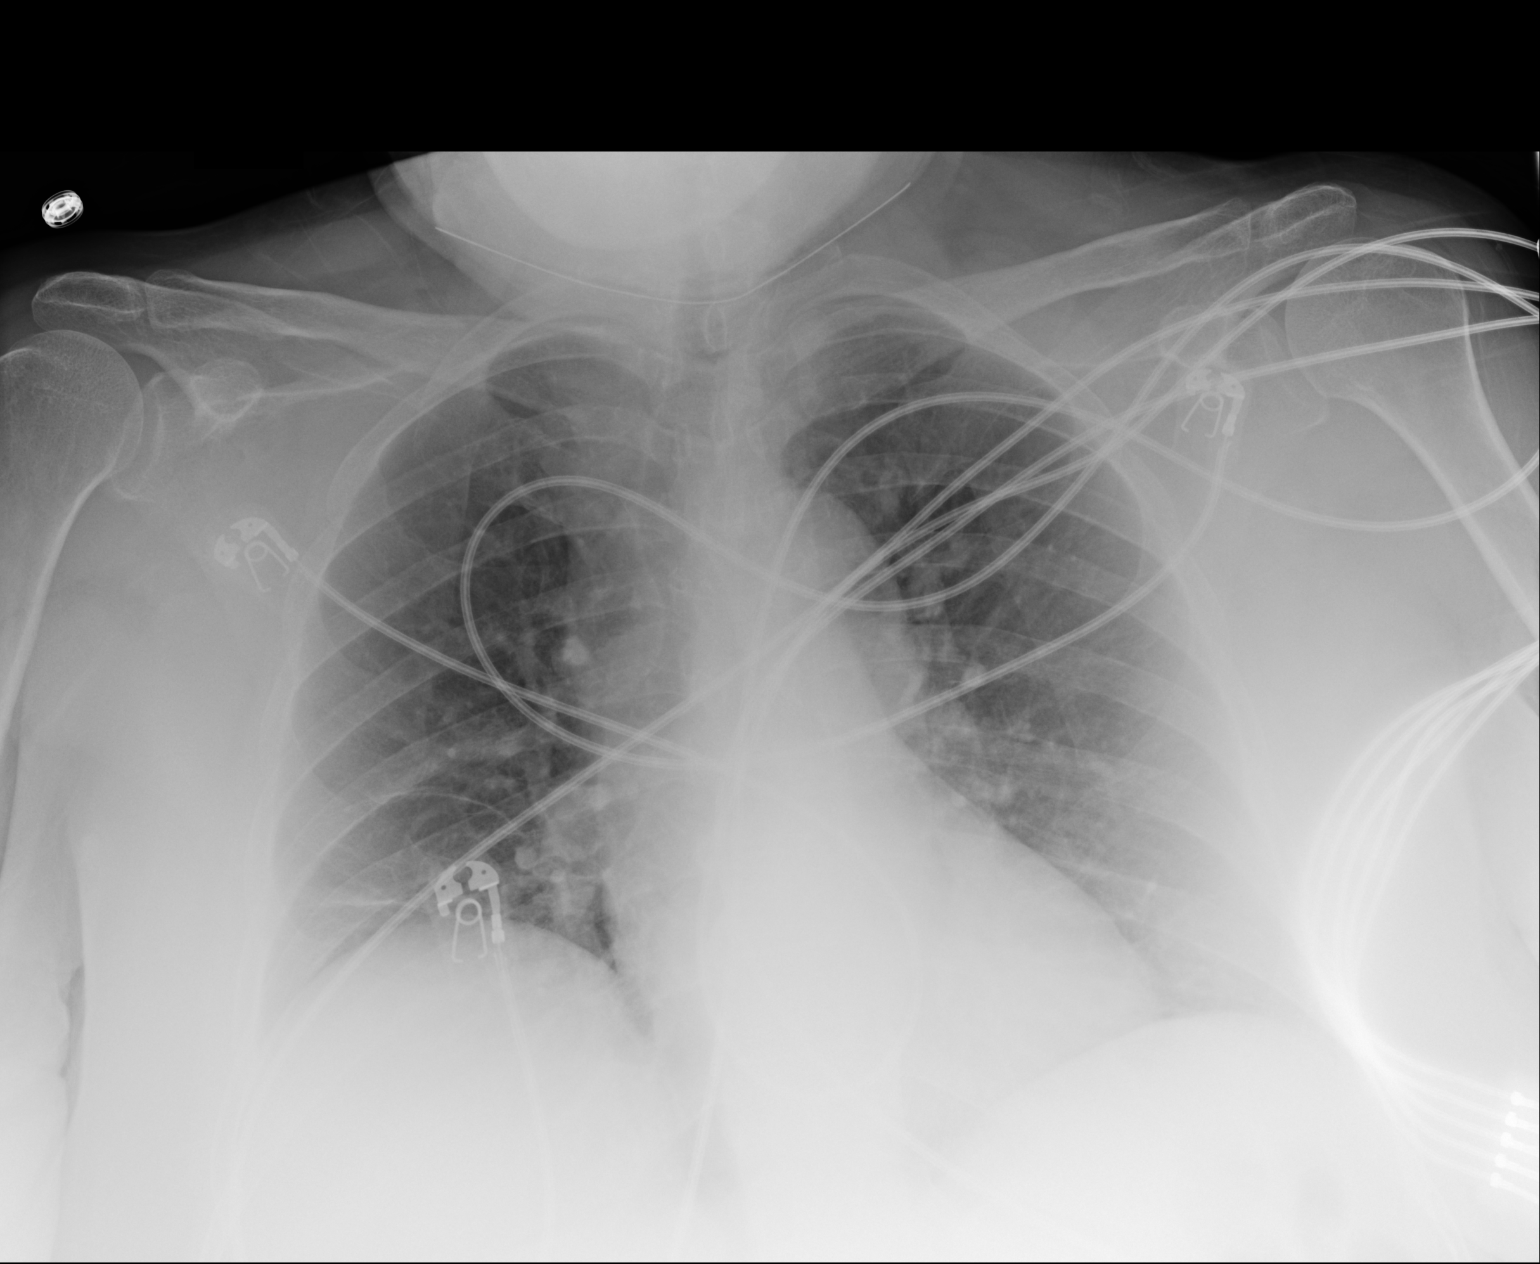

[1 of 1 positions shown; findings below may reference images not displayed]

FINDINGS: The heart, hila, mediastinum, lungs, and pleura are normal.
IMPRESSION: No active disease.

## 2018-01-17 DIAGNOSIS — R0902 Hypoxemia: Secondary | ICD-10-CM | POA: Diagnosis not present

## 2018-01-20 ENCOUNTER — Other Ambulatory Visit: Payer: Self-pay | Admitting: Family Medicine

## 2018-01-27 IMAGING — CT CT HEAD W/O CM
1 of 2 series · 15 of 30 positions shown, 19 images · non-contrast
Comparison: 05/10/2013; brain MRI -11/27/2011

CLINICAL DATA: Post fall at home hitting posterior aspect of the
head.

EXAM:
CT HEAD WITHOUT CONTRAST
TECHNIQUE: Contiguous axial images were obtained from the base of the skull
through the vertex without intravenous contrast.

[Series 3: headtrauma 2.4 h60s · axial · 0.45mm/px · z∈[+242,+407]mm · 15 of 72 slices shown, 19 images]
[im 4/72  brain]
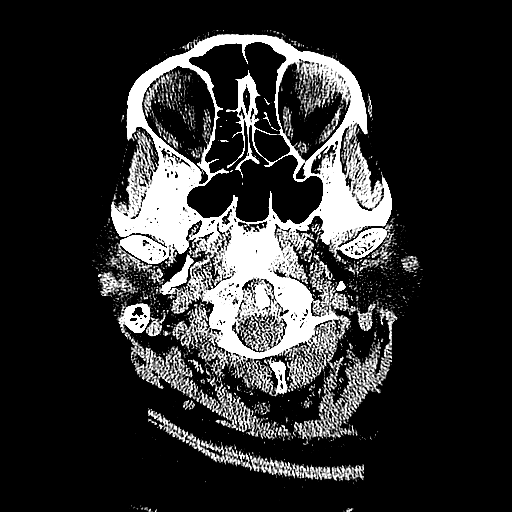
[im 4/72  bone]
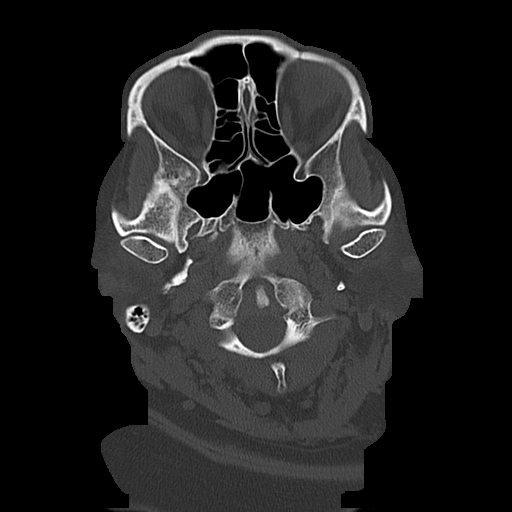
[im 8/72  brain]
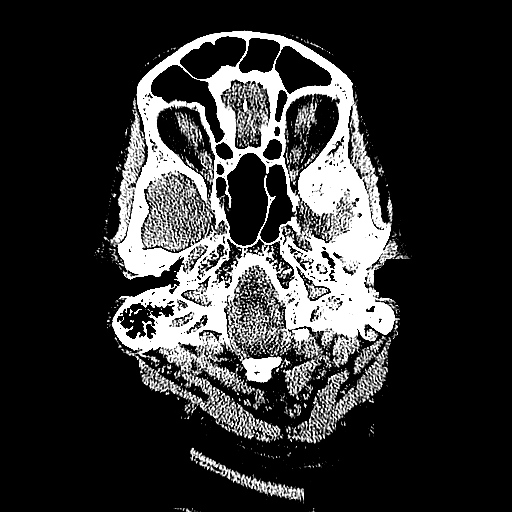
[im 15/72  brain]
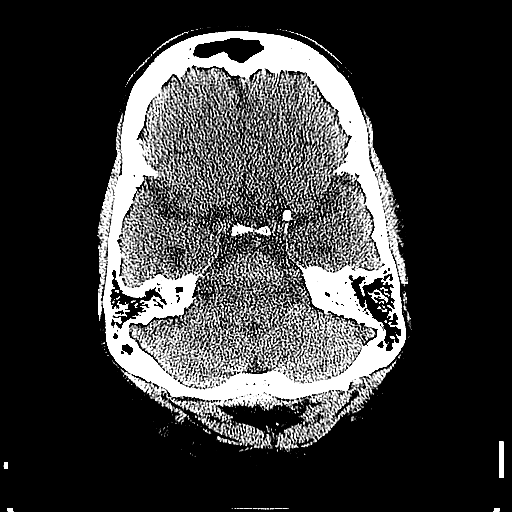
[im 19/72  brain]
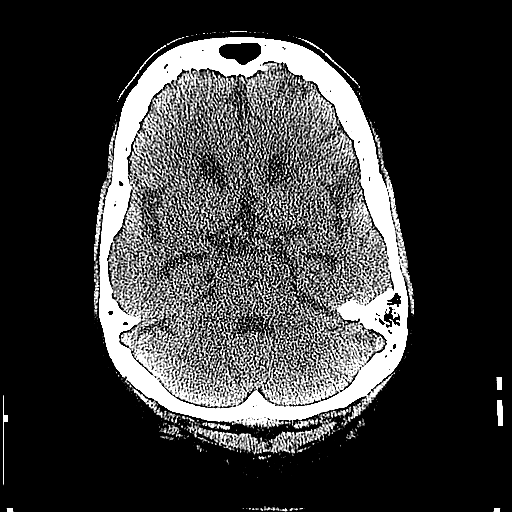
[im 23/72  brain]
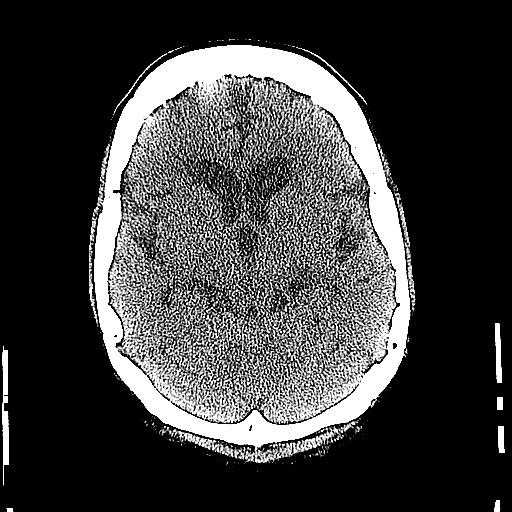
[im 23/72  bone]
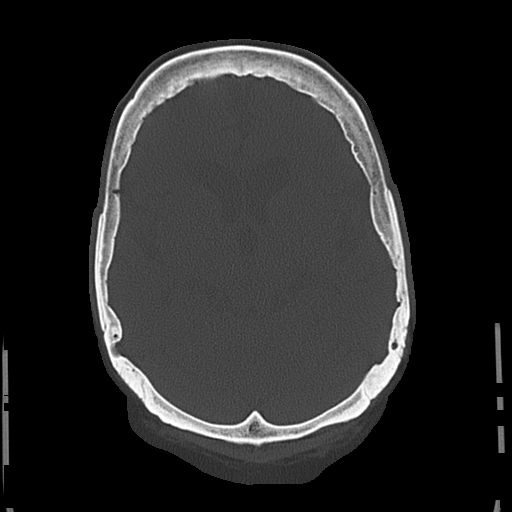
[im 27/72  brain]
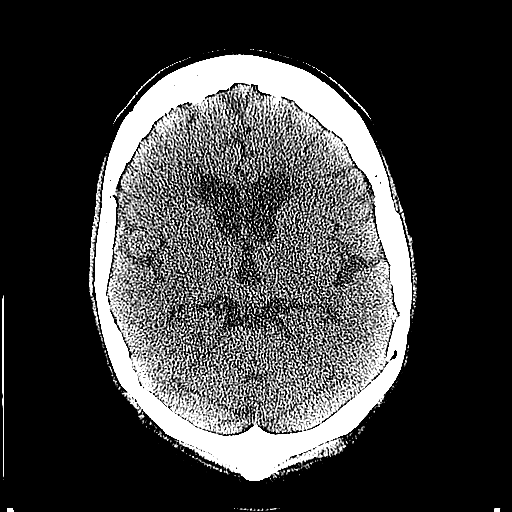
[im 30/72  brain]
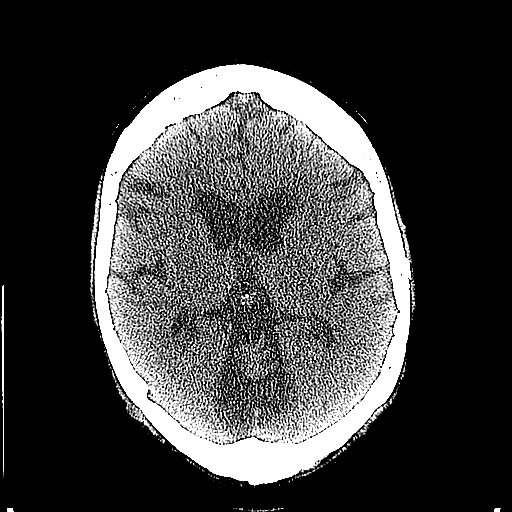
[im 38/72  brain]
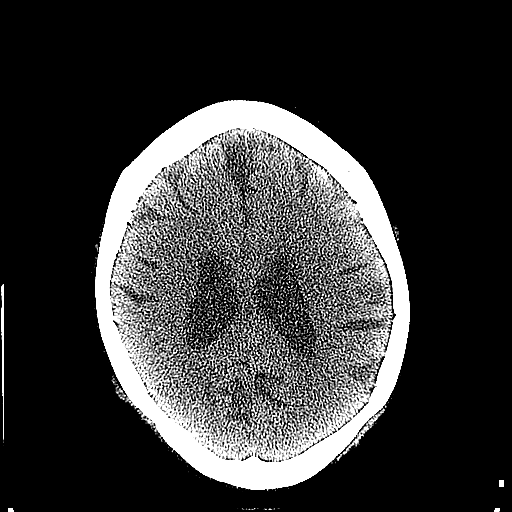
[im 42/72  brain]
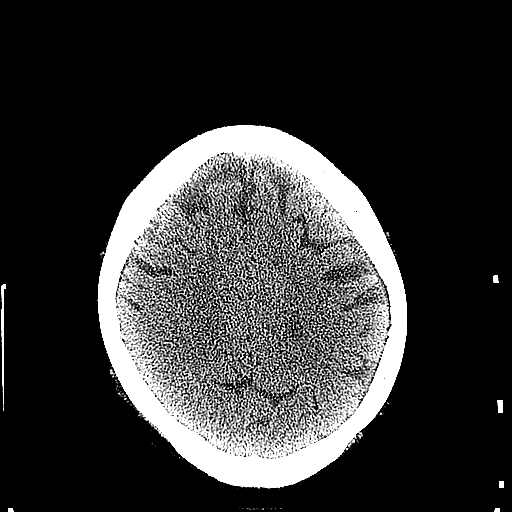
[im 42/72  bone]
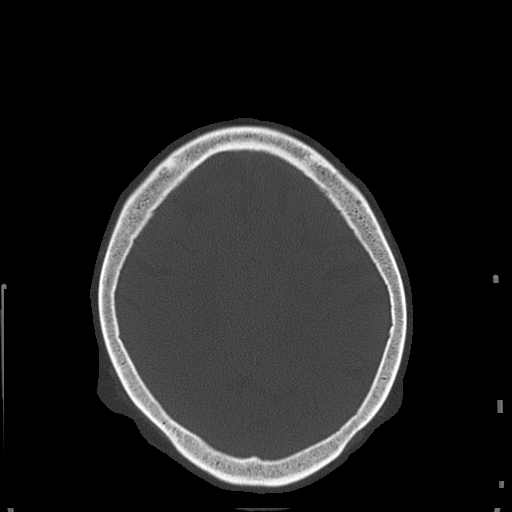
[im 45/72  brain]
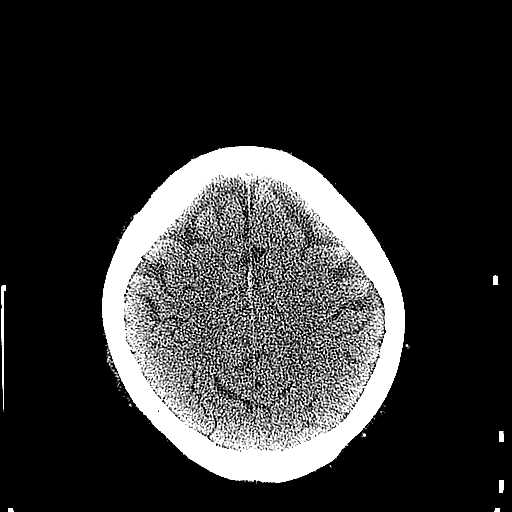
[im 49/72  brain]
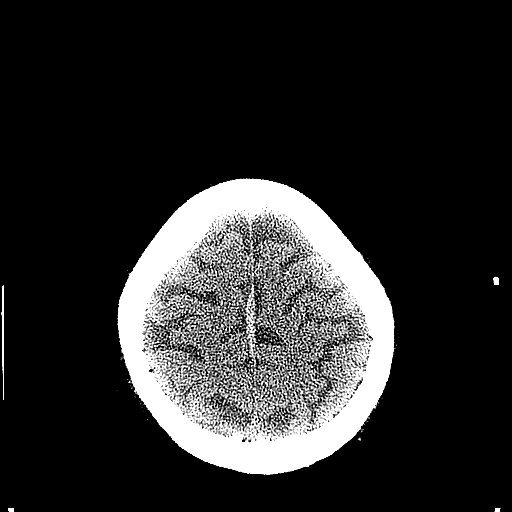
[im 53/72  brain]
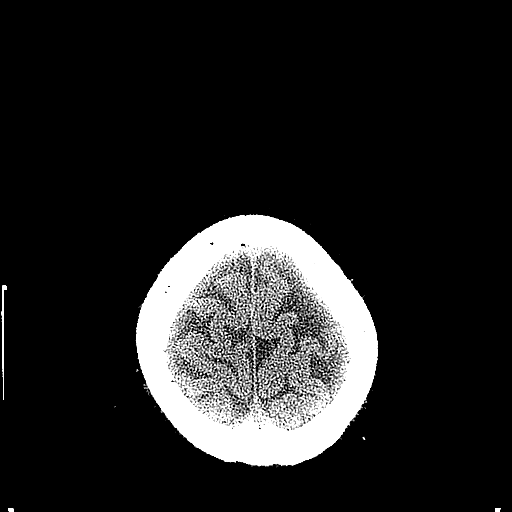
[im 60/72  brain]
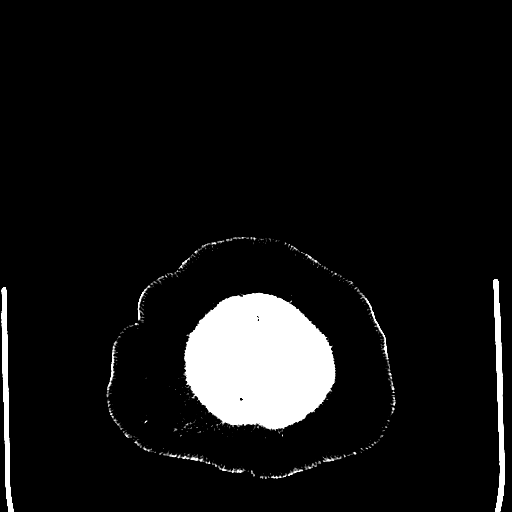
[im 60/72  bone]
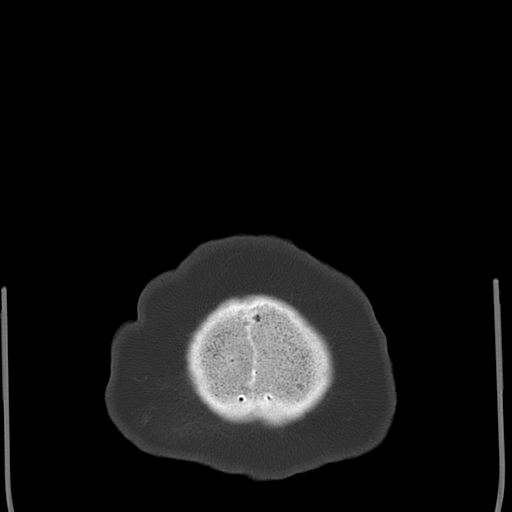
[im 64/72  brain]
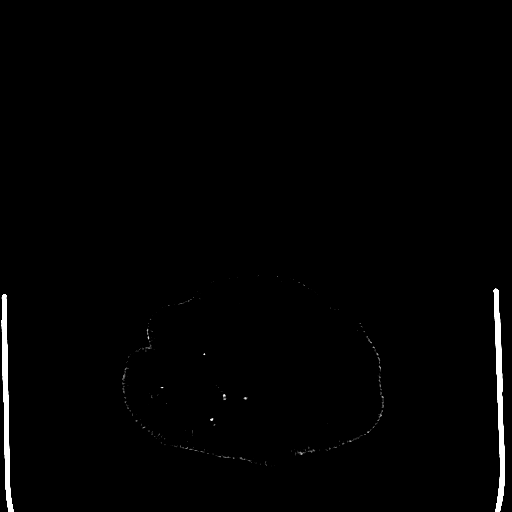
[im 68/72  brain]
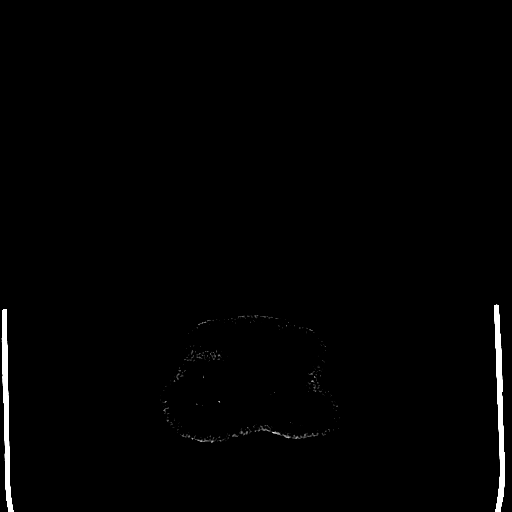

[15 of 30 positions shown; findings below may reference images not displayed]

FINDINGS: There is a potential minimal amount of subcutaneous stranding about
the high right posterior parietal calvaria (image 32, series 2)
without associated displaced calvarial fracture or radiopaque
foreign body.

Similar findings of centralized atrophy with mild ex vacuo
dilatation of the ventricular system. Scattered minimal
periventricular hypodensities compatible microvascular ischemic
disease. The gray-white differentiation is otherwise well maintained
without CT evidence of acute large territory infarct. No
intraparenchymal or extra-axial mass or hemorrhage. Unchanged size a
configuration of the ventricles and basilar cisterns. No midline
shift. Intracranial atherosclerosis. Limited visualization the
paranasal sinuses and mastoid air cells is normal.
IMPRESSION: 1. Very minimal amount of soft tissue stranding about the high right
posterior parietal calvarium without associated displaced calvarial
fracture, radiopaque foreign body or acute intracranial process.
2. Similar findings of centralized atrophy and microvascular
ischemic disease.

## 2018-01-27 IMAGING — DX DG HIP (WITH OR WITHOUT PELVIS) 3-4V BILAT
5 series · 5 of 5 positions shown · non-contrast
Comparison: 08/06/2015

CLINICAL DATA: Fall.  Bilateral hip pain

EXAM:
DG HIP (WITH OR WITHOUT PELVIS) 3-4V BILAT

[pelvis ap]
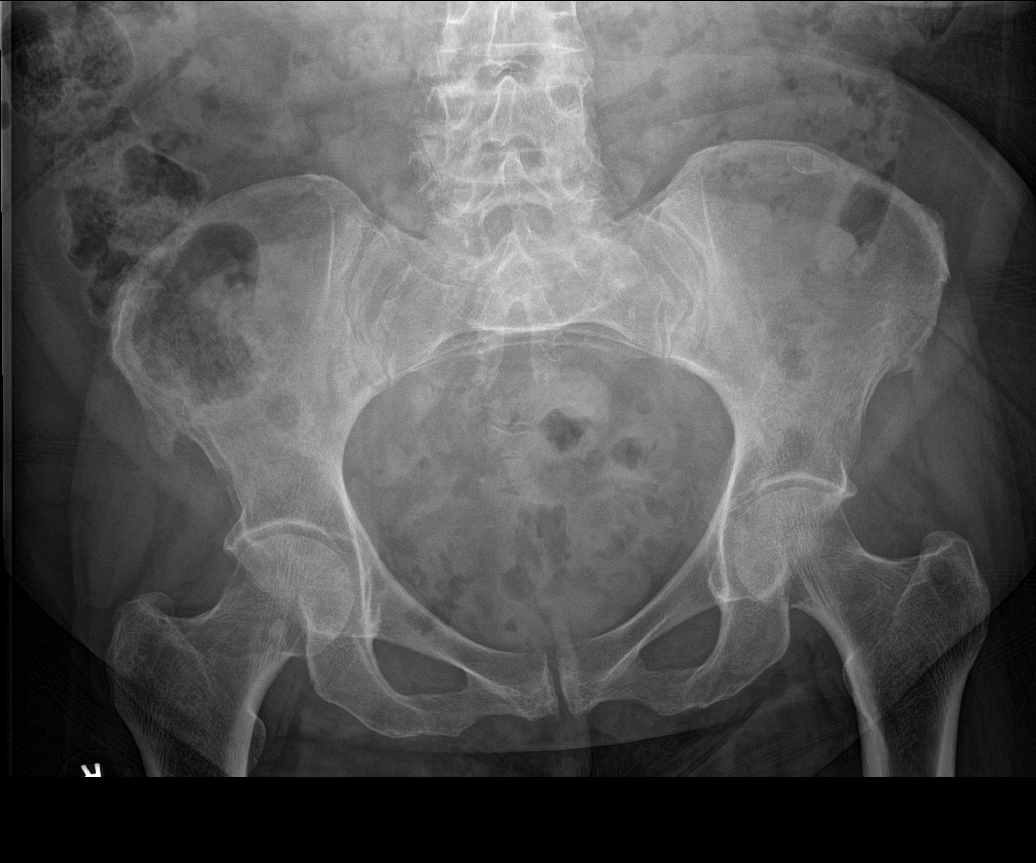

[hip ap (1 of 2)]
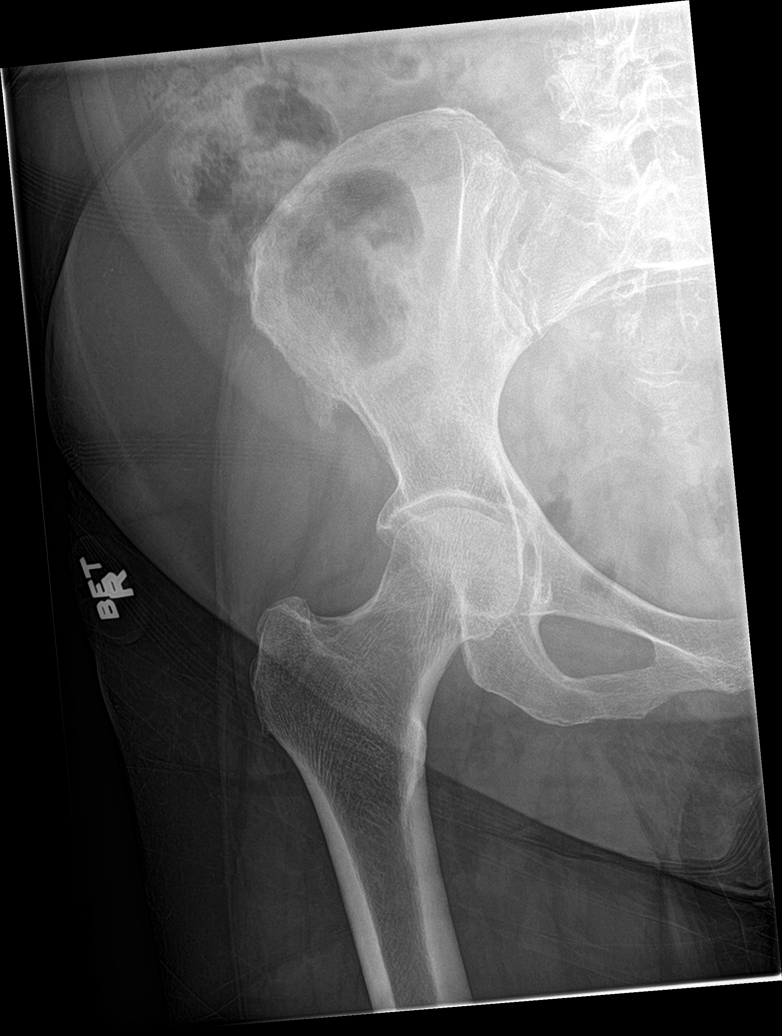

[hip lat (1 of 2)]
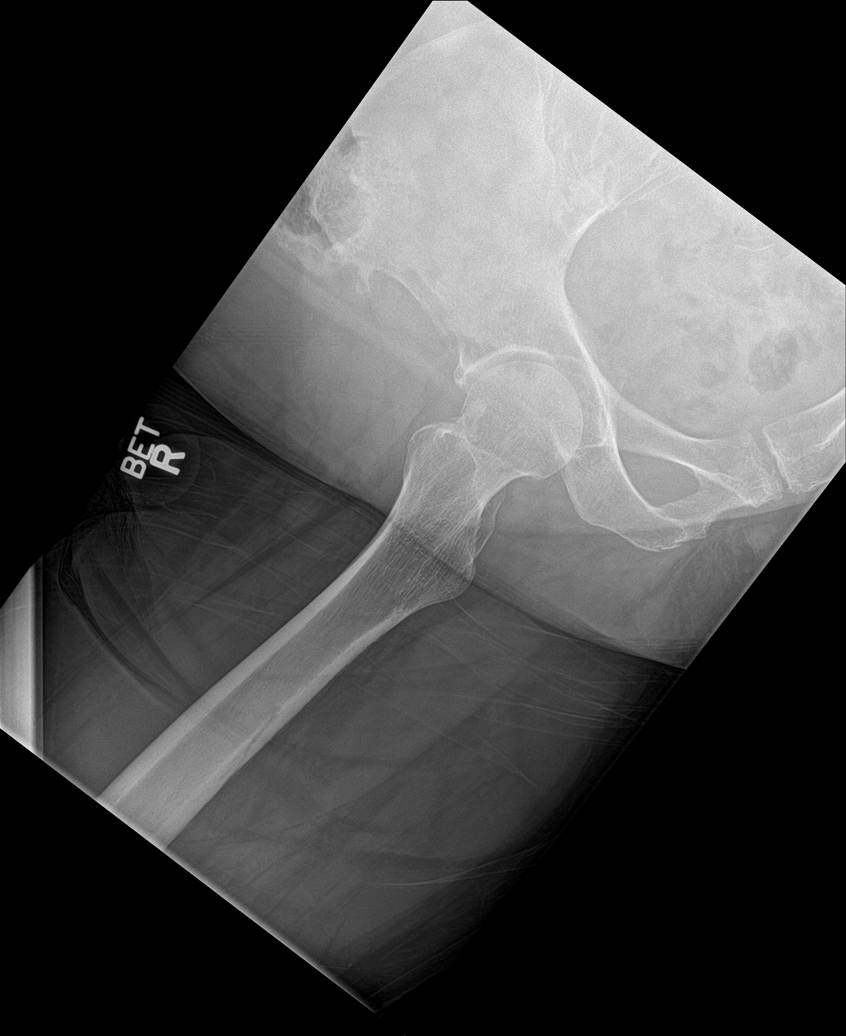

[hip ap (2 of 2)]
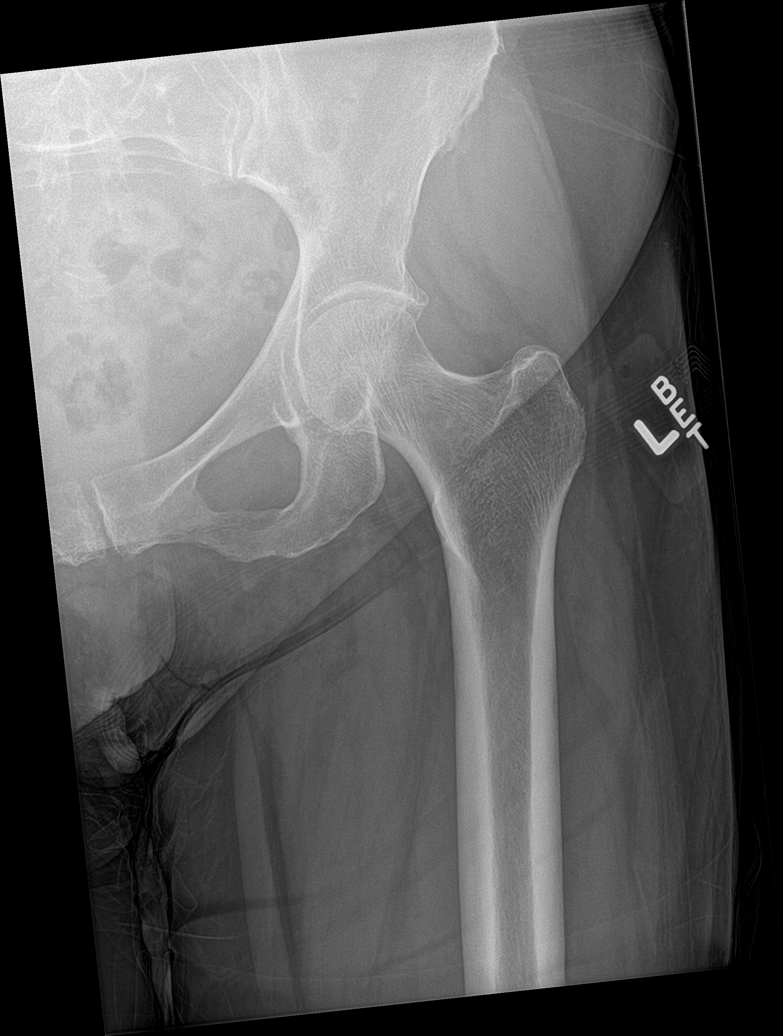

[hip lat (2 of 2)]
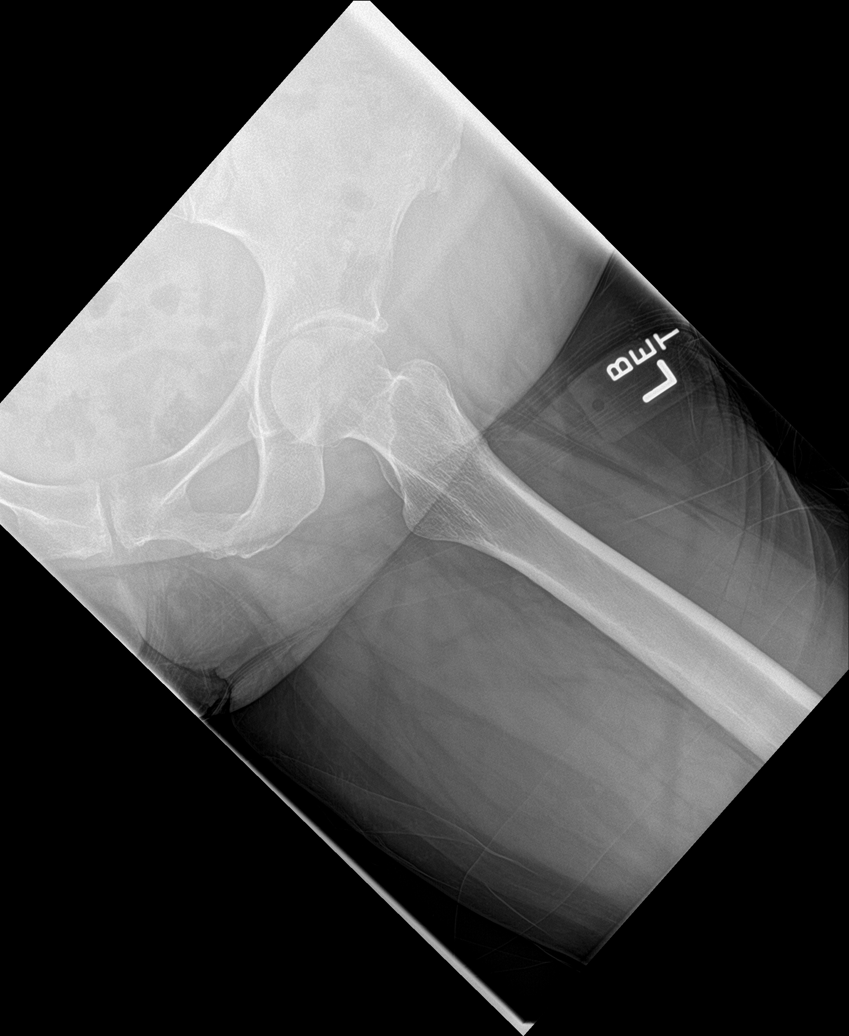

[5 of 5 positions shown; findings below may reference images not displayed]

FINDINGS: There is no evidence of hip fracture or dislocation. There is no
evidence of arthropathy or other focal bone abnormality.
IMPRESSION: Negative.

## 2018-01-27 IMAGING — DX DG KNEE COMPLETE 4+V*L*
4 series · 4 of 4 positions shown · non-contrast
Comparison: 07/24/2004

CLINICAL DATA: Fall.  Bilateral hip and knee pain

EXAM:
LEFT KNEE - COMPLETE 4+ VIEW

[knee ap (1 of 3)]
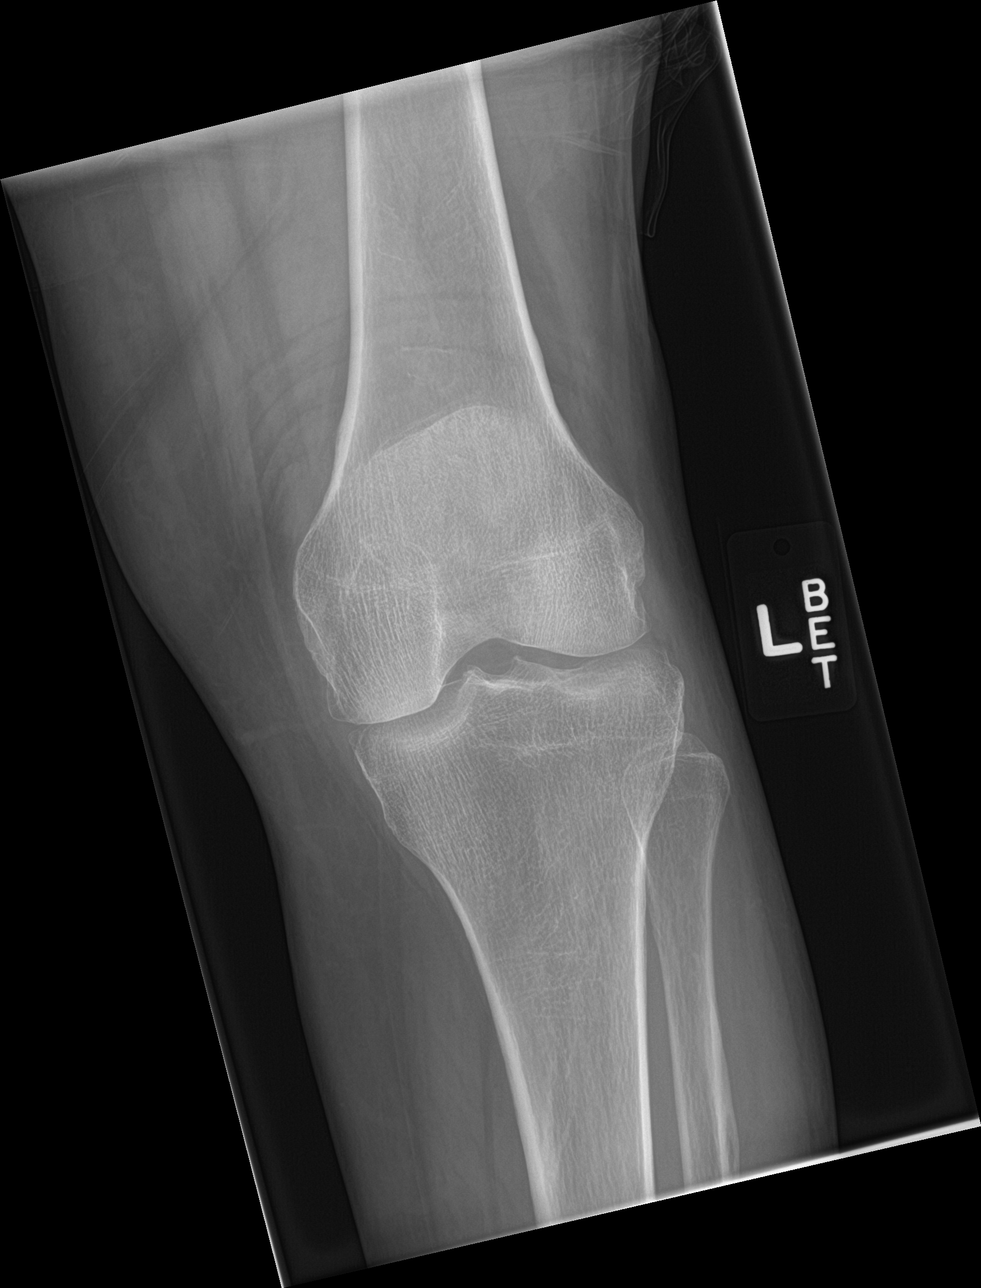

[knee lat]
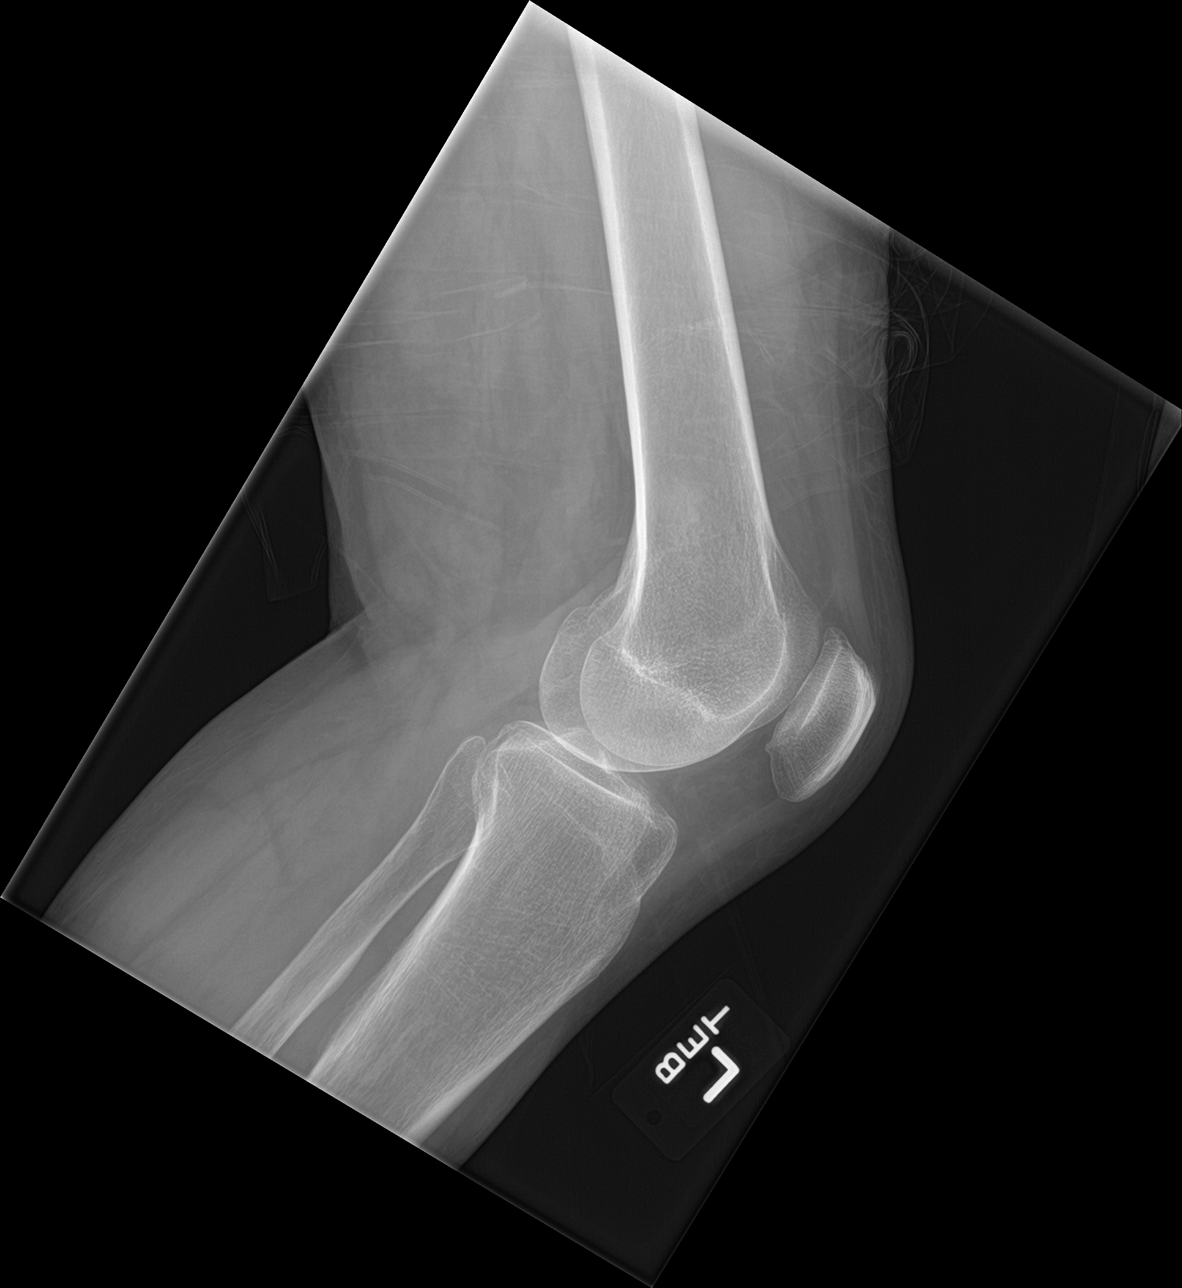

[knee ap (2 of 3)]
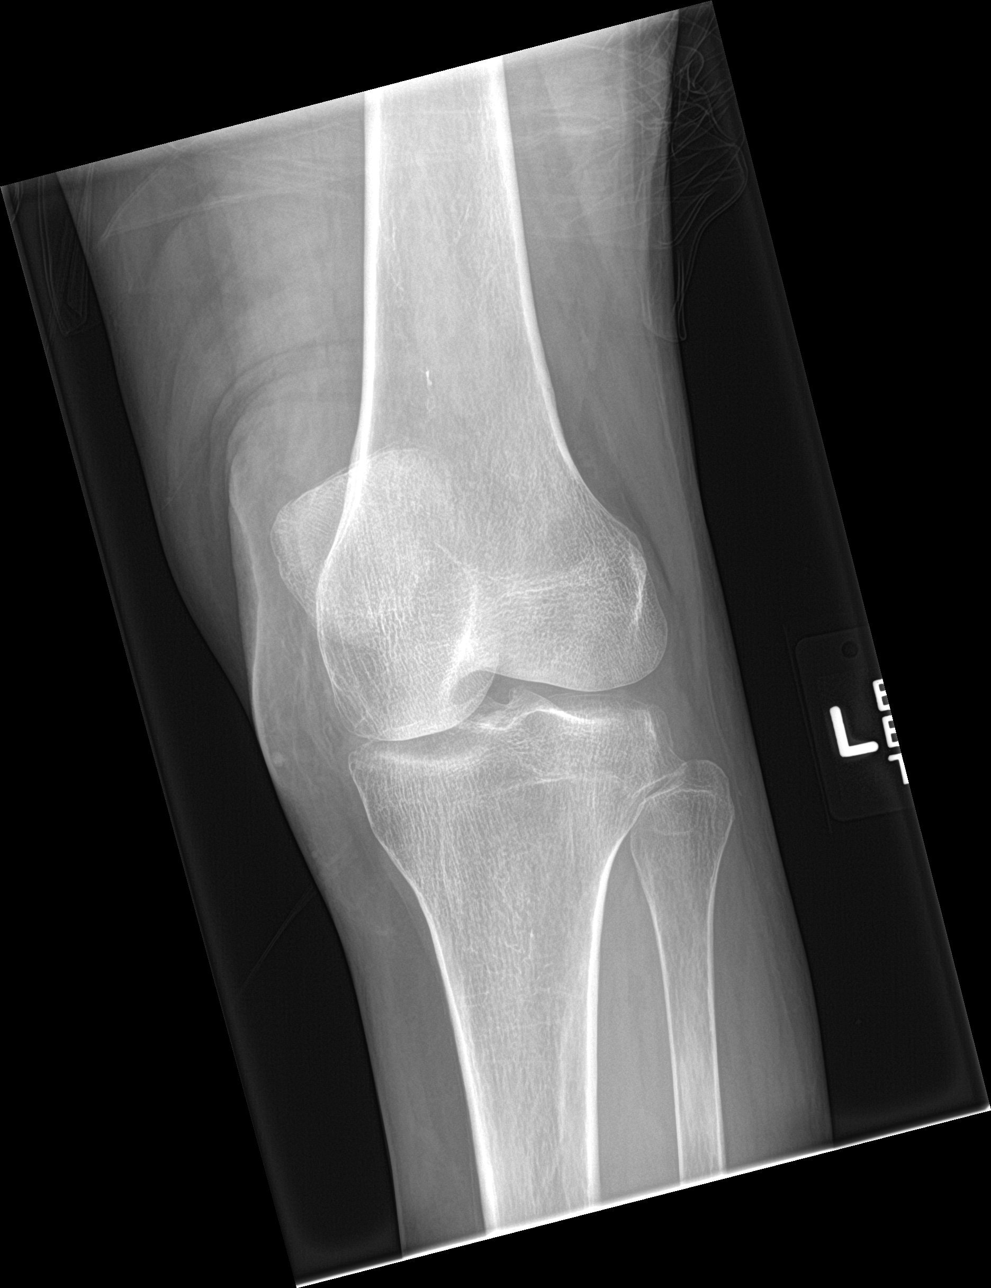

[knee ap (3 of 3)]
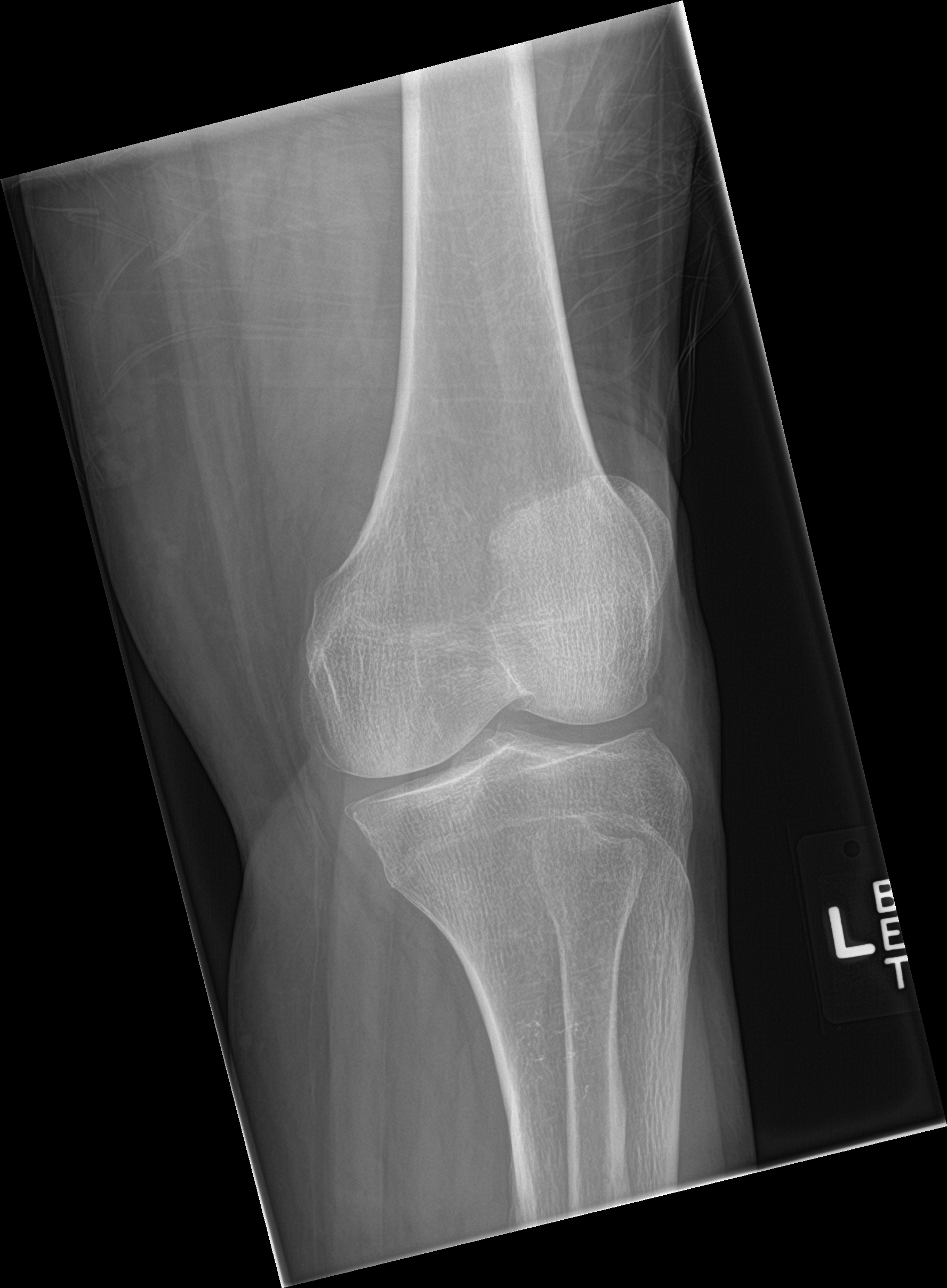

[4 of 4 positions shown; findings below may reference images not displayed]

FINDINGS: There is no evidence of fracture, dislocation, or joint effusion.
There is no evidence of arthropathy or other focal bone abnormality.
Soft tissues are unremarkable.
IMPRESSION: Negative.

## 2018-01-27 IMAGING — DX DG CHEST 2V
2 series · 2 of 2 positions shown · non-contrast
Comparison: 08/06/2015

CLINICAL DATA: Fever.  Fall

EXAM:
CHEST  2 VIEW

[chest pa]
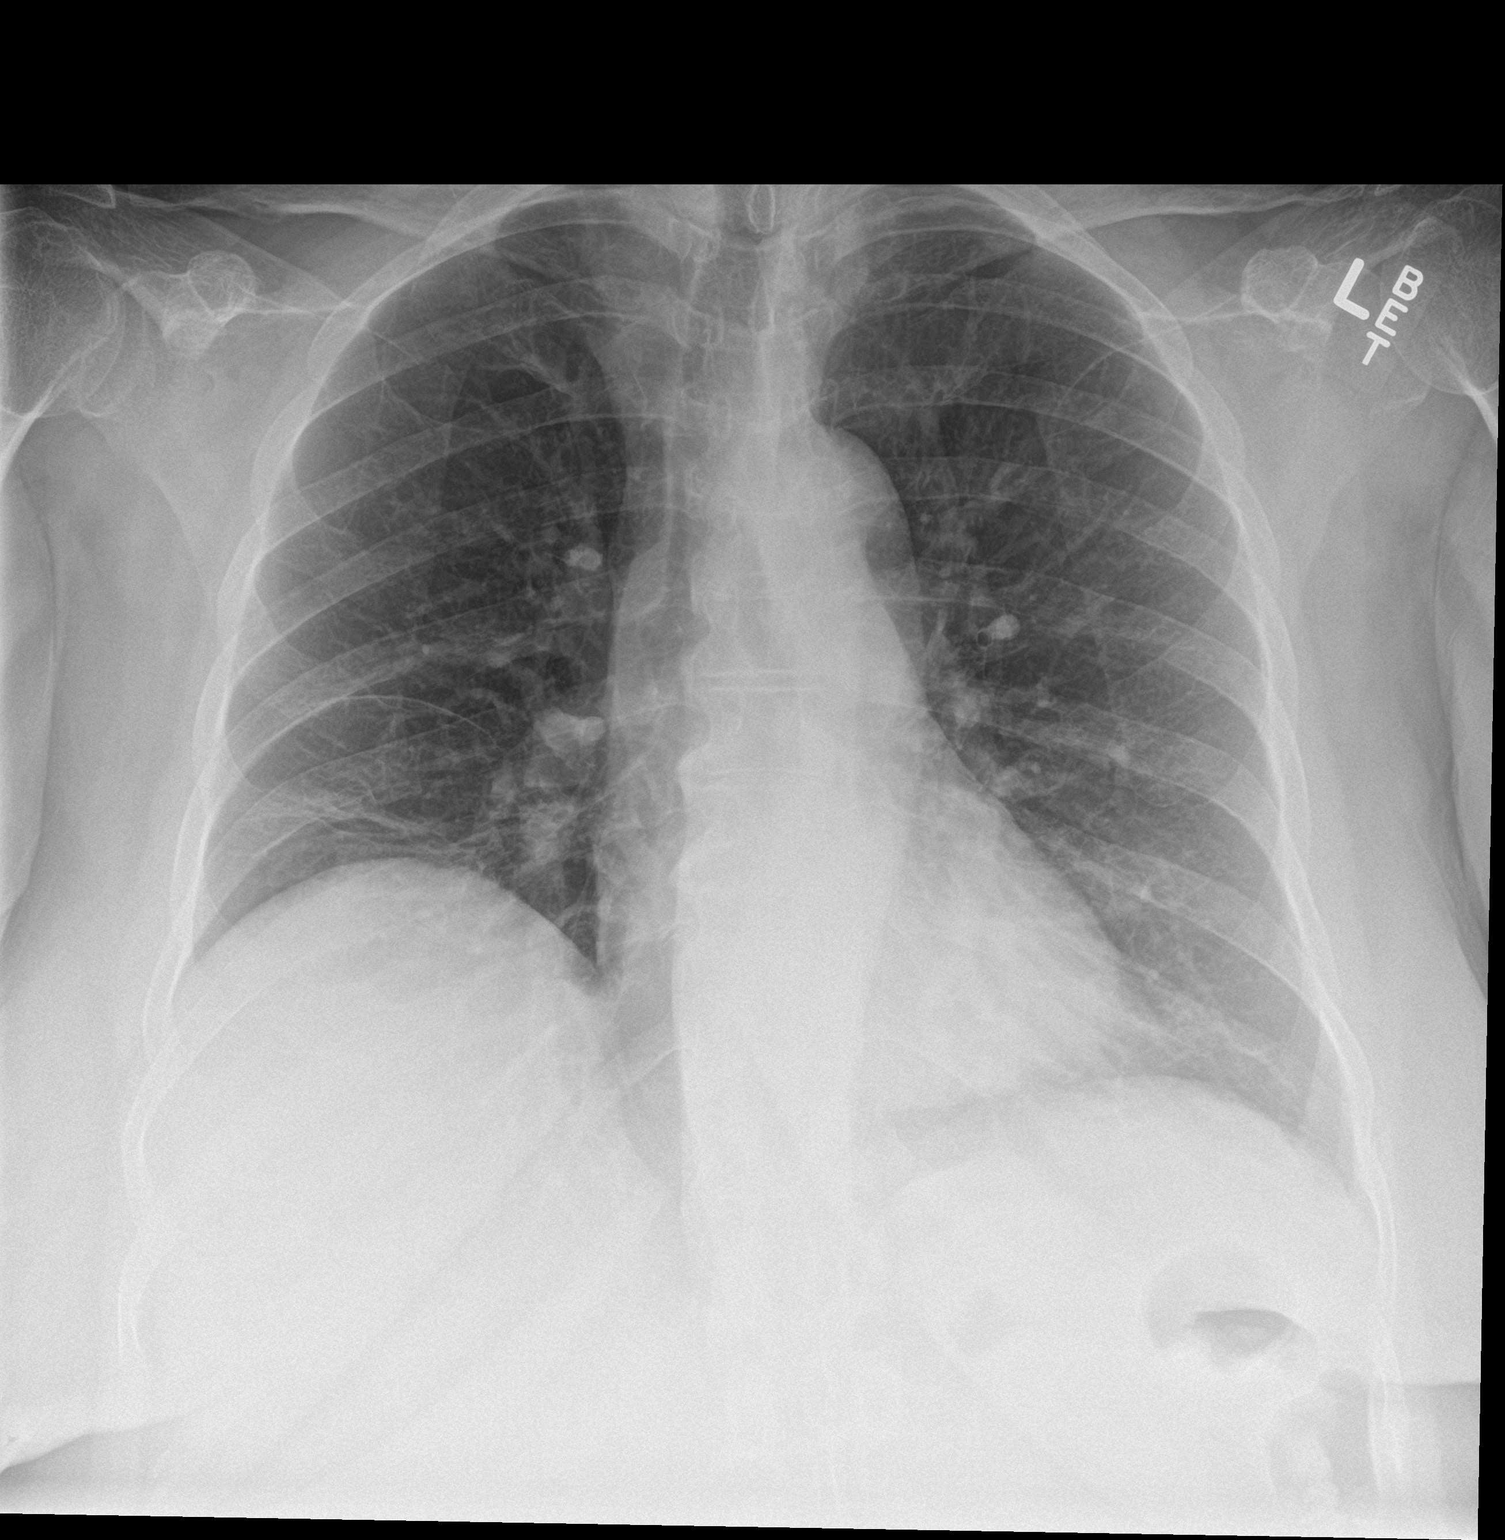

[chest lat]
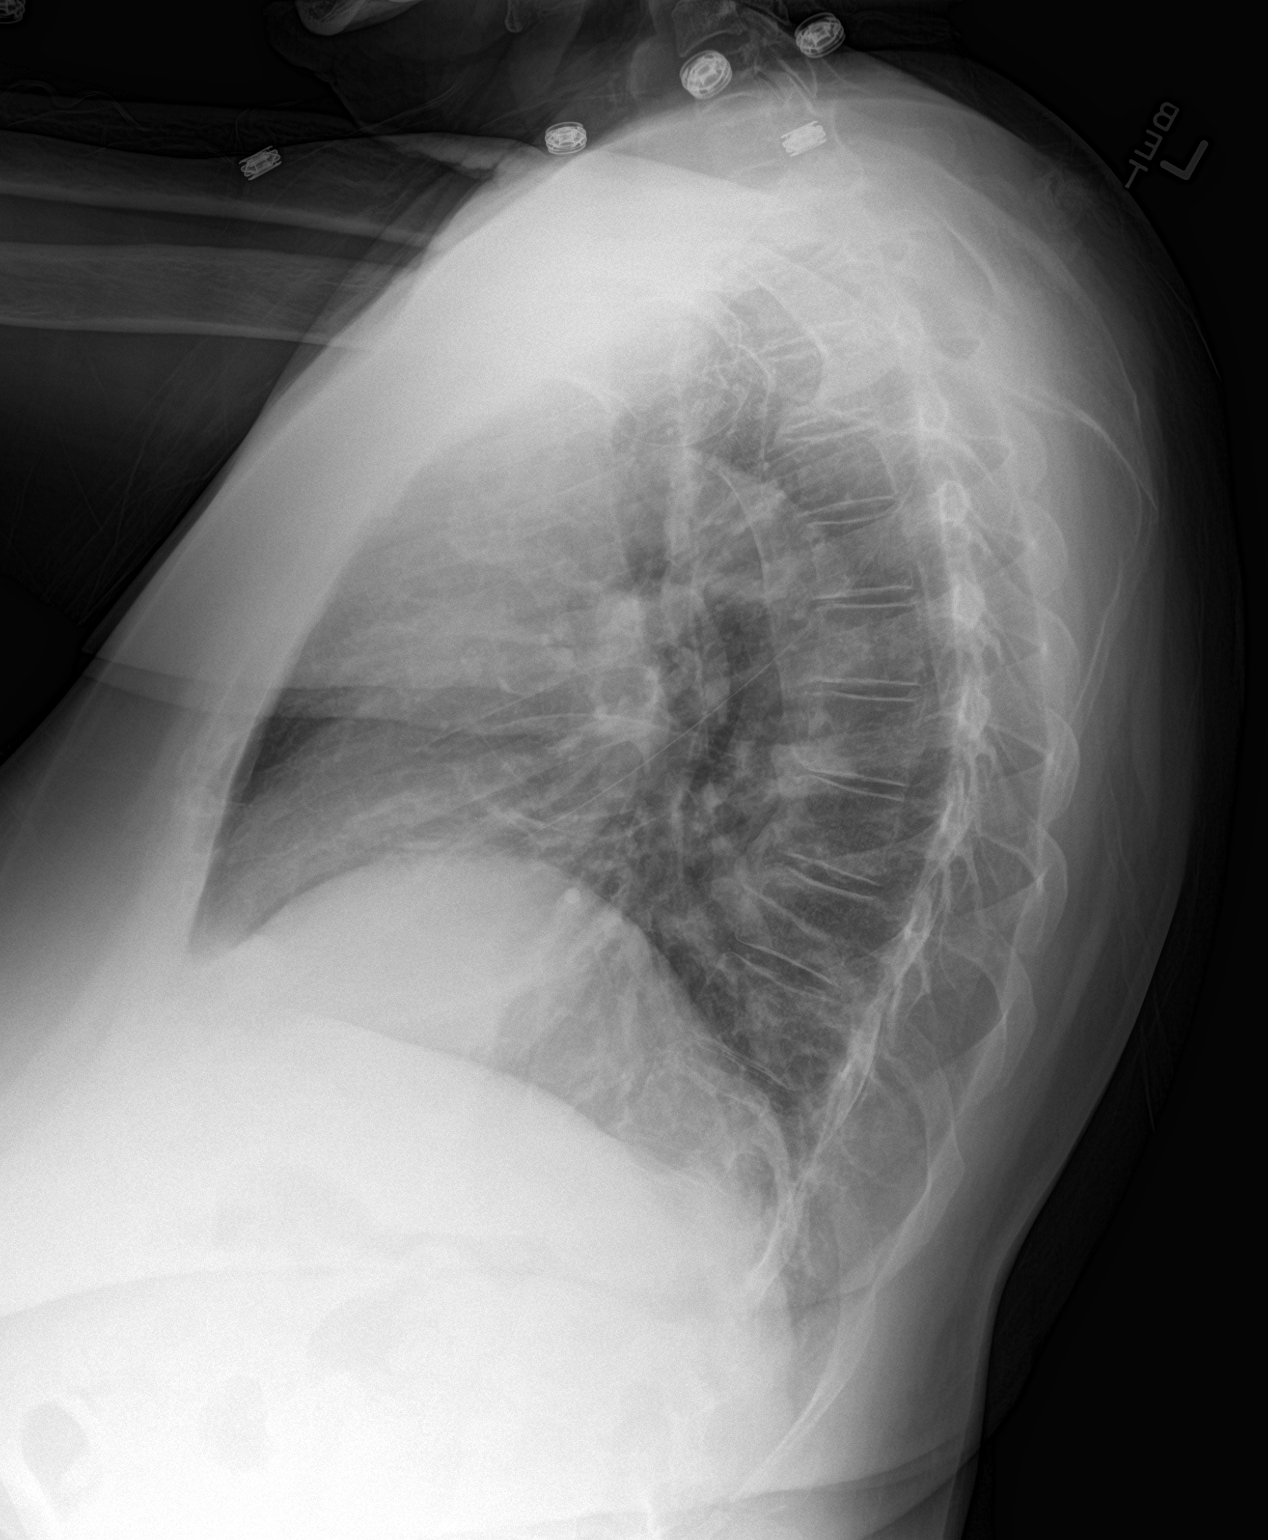

[2 of 2 positions shown; findings below may reference images not displayed]

FINDINGS: Elevated right hemidiaphragm with right lower lobe scarring
unchanged from the prior study. Negative for heart failure. No edema
or effusion. Negative for mass or pneumonia.
IMPRESSION: No active cardiopulmonary disease.

## 2018-01-27 IMAGING — DX DG KNEE COMPLETE 4+V*R*
4 series · 4 of 4 positions shown · non-contrast
Comparison: None.

CLINICAL DATA: Bilateral hip and bilateral knee pain after falling
while taking clothes out of her dryer today. PT states she has had
several falls in the past.

EXAM:
RIGHT KNEE - COMPLETE 4+ VIEW

[knee ap (1 of 3)]
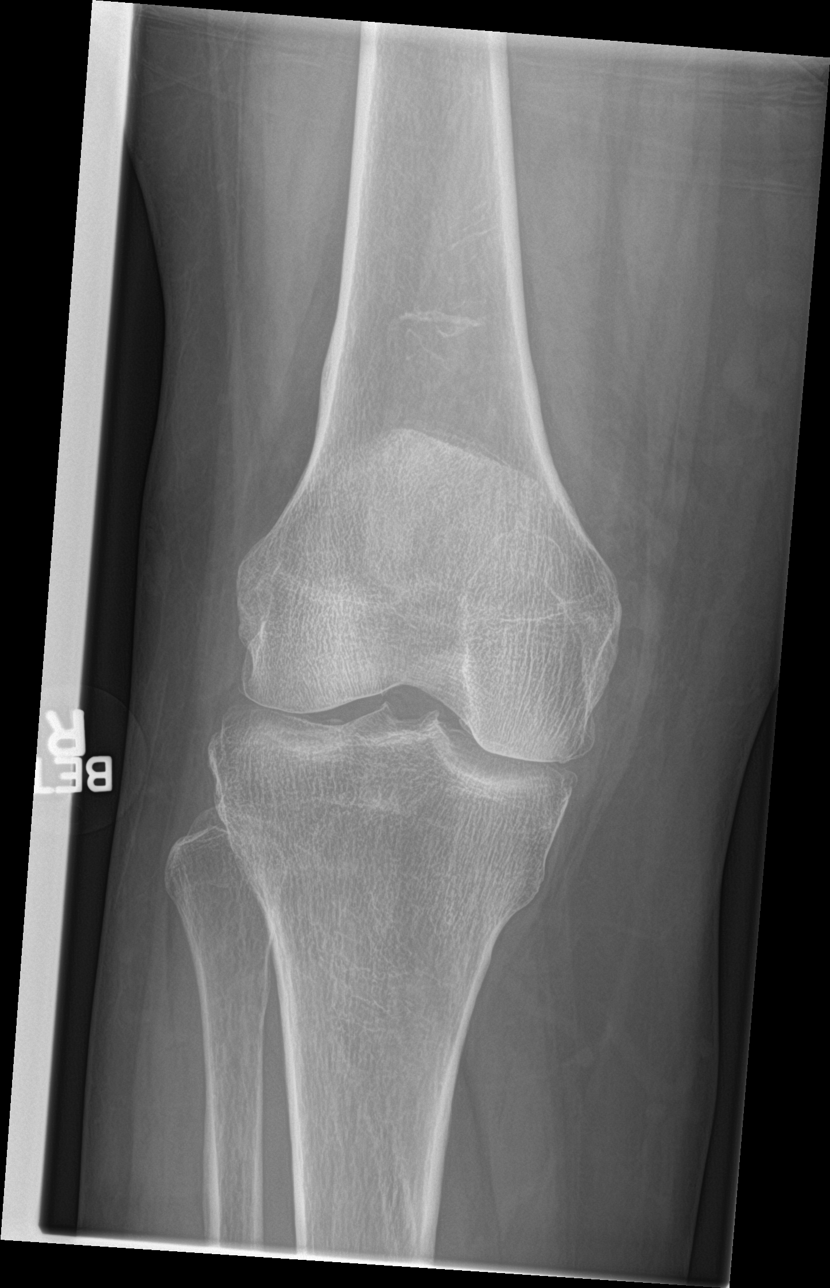

[knee lat]
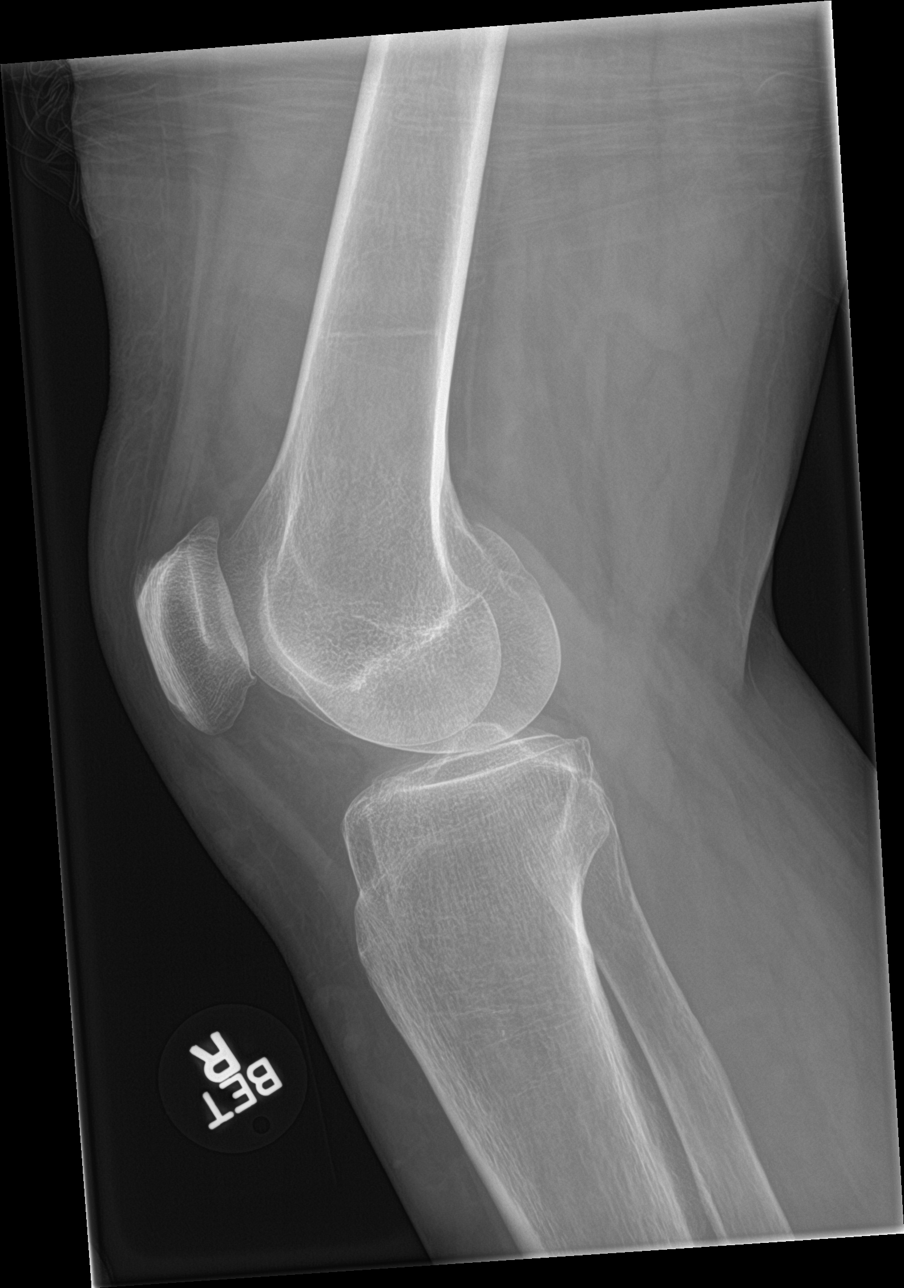

[knee ap (2 of 3)]
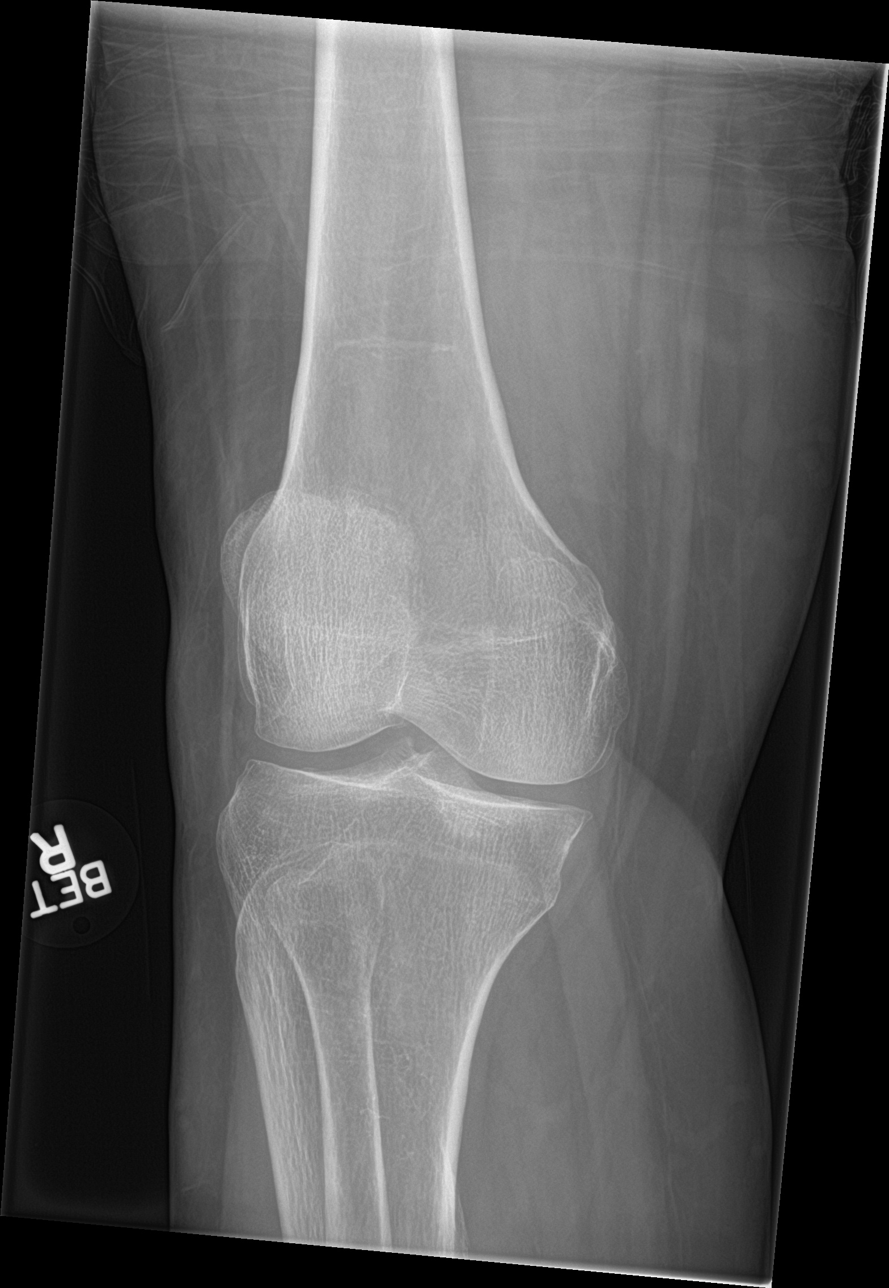

[knee ap (3 of 3)]
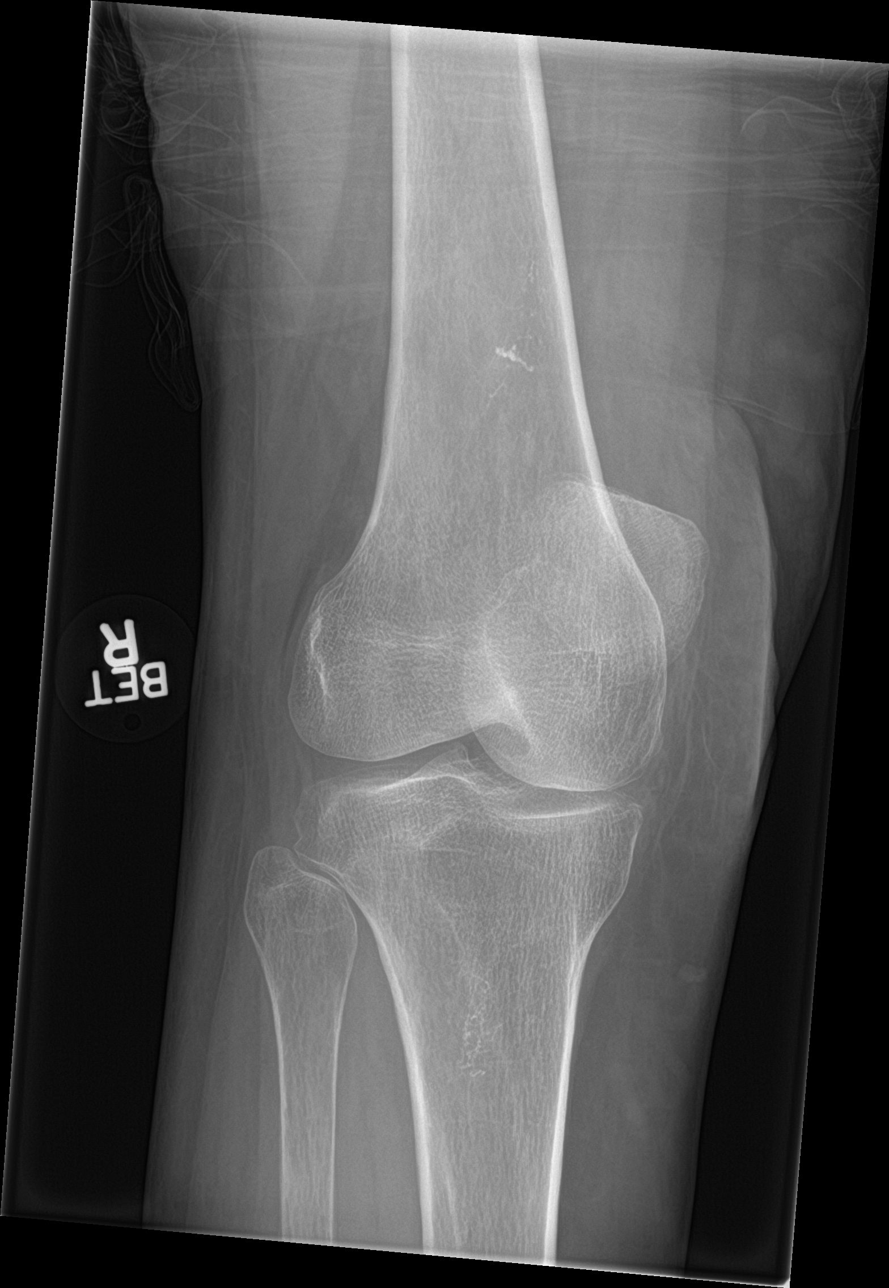

[4 of 4 positions shown; findings below may reference images not displayed]

FINDINGS: There is no evidence of fracture, dislocation, or joint effusion.
There is generalized osteopenia. Soft tissues are unremarkable.
IMPRESSION: No acute osseous injury of the right knee.

## 2018-01-28 IMAGING — CT CT RENAL STONE PROTOCOL
2 of 3 series · 15 of 46 positions shown, 17 images · non-contrast
Comparison: 07/26/2015

CLINICAL DATA: Evaluate for ureteral calculi, lower abdominal pain
for 2 days

EXAM:
CT ABDOMEN AND PELVIS WITHOUT CONTRAST
TECHNIQUE: Multidetector CT imaging of the abdomen and pelvis was performed
following the standard protocol without IV contrast.

[Series 2: standard/full over (age)lbs 5.0 · axial · 0.82mm/px · z∈[+425,+880]mm · 12 of 105 slices shown, 14 images]
[im 7/105  soft-tissue]
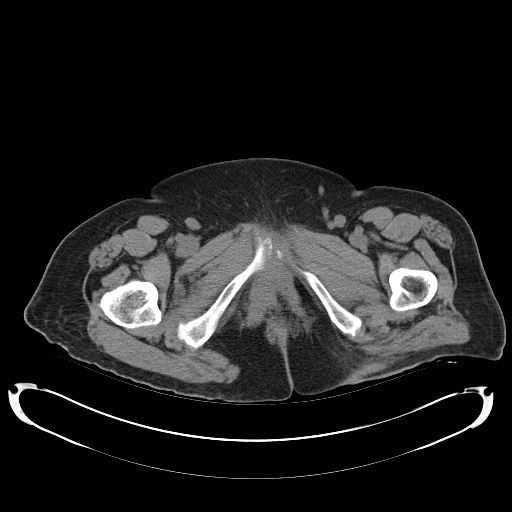
[im 7/105  bone]
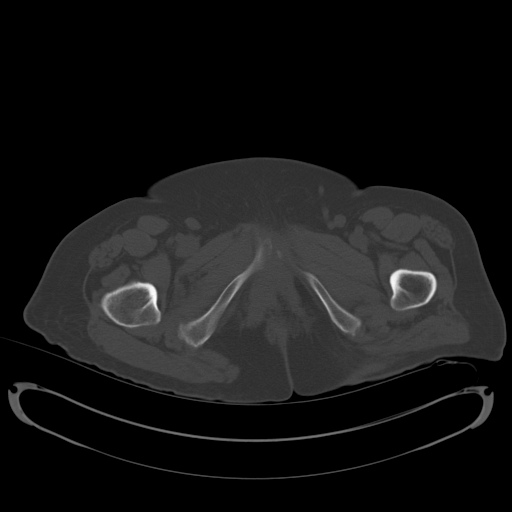
[im 14/105  soft-tissue]
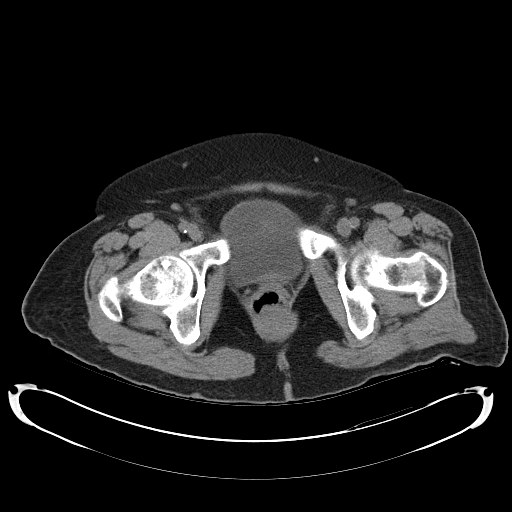
[im 24/105  soft-tissue]
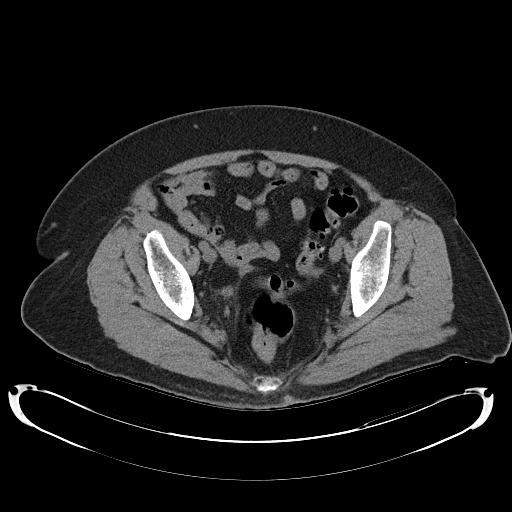
[im 31/105  soft-tissue]
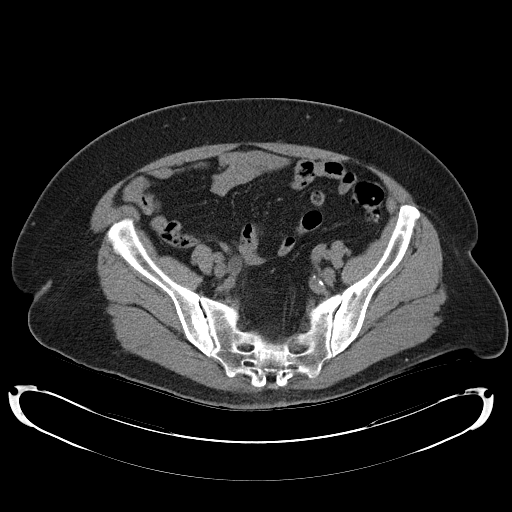
[im 41/105  soft-tissue]
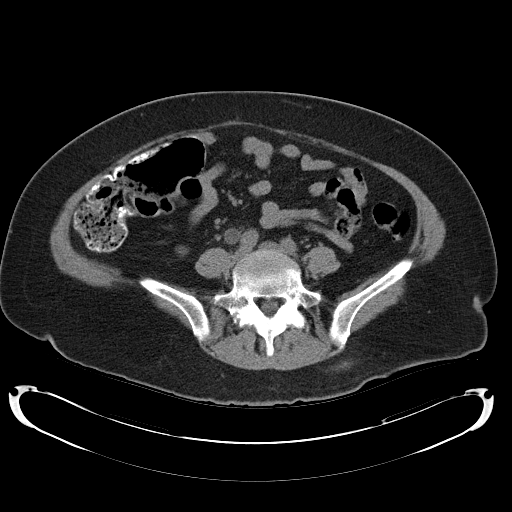
[im 47/105  soft-tissue]
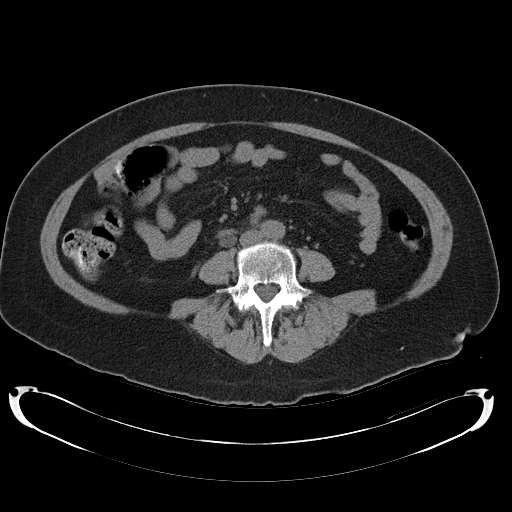
[im 58/105  soft-tissue]
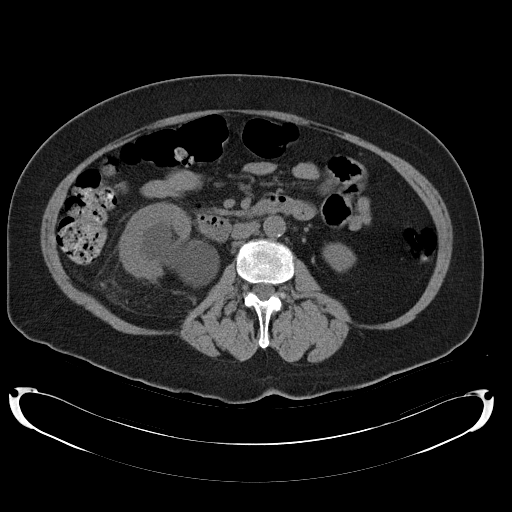
[im 64/105  soft-tissue]
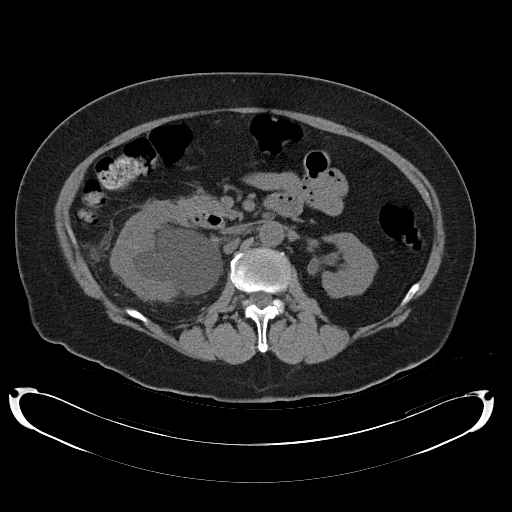
[im 74/105  soft-tissue]
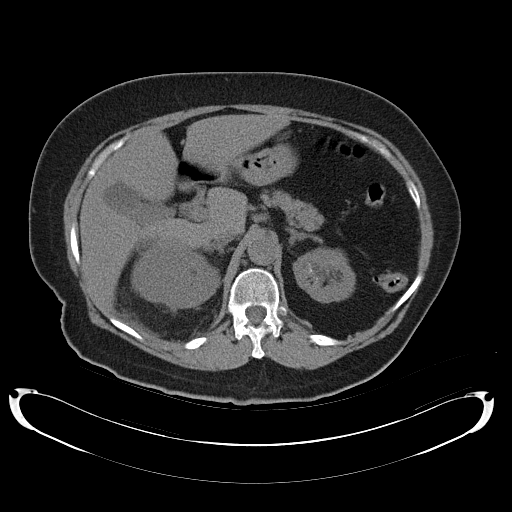
[im 74/105  bone]
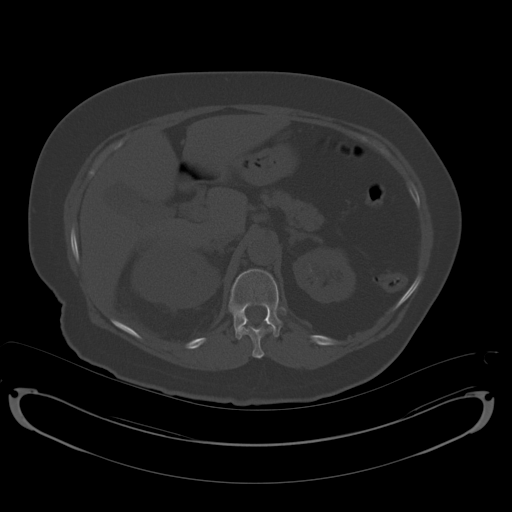
[im 81/105  soft-tissue]
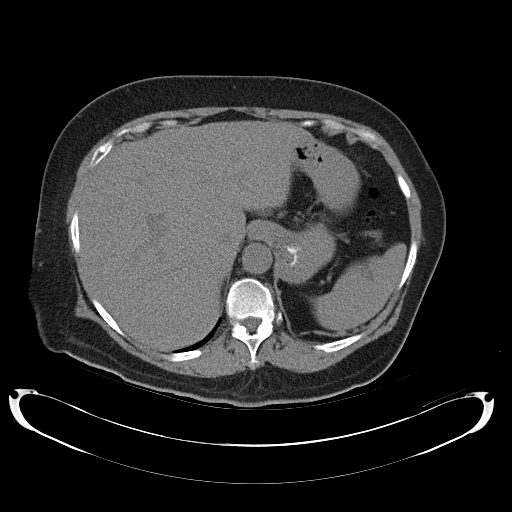
[im 91/105  soft-tissue]
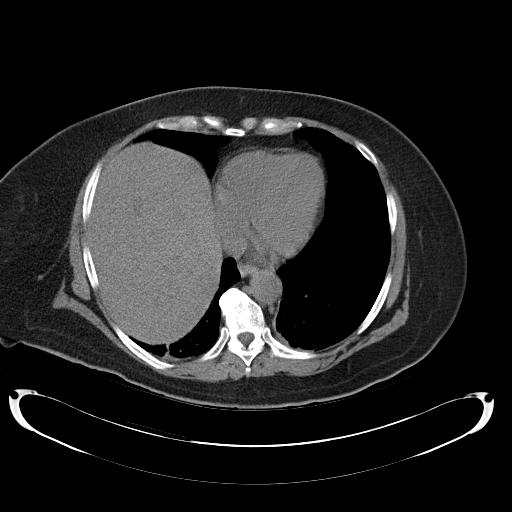
[im 98/105  soft-tissue]
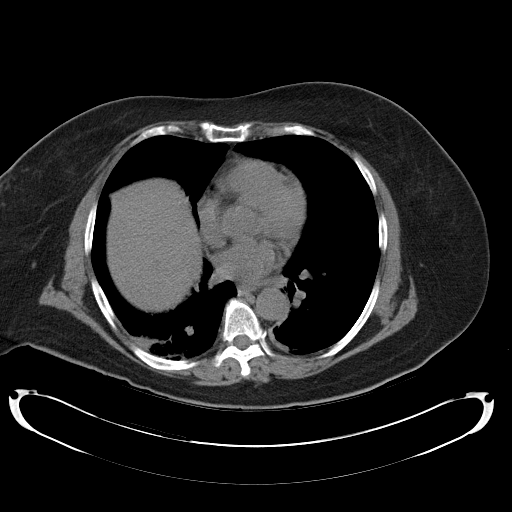

[Series 3: mpr coronal · coronal · 0.87mm/px · 3 of 85 slices shown]
[im 29/85  soft-tissue]
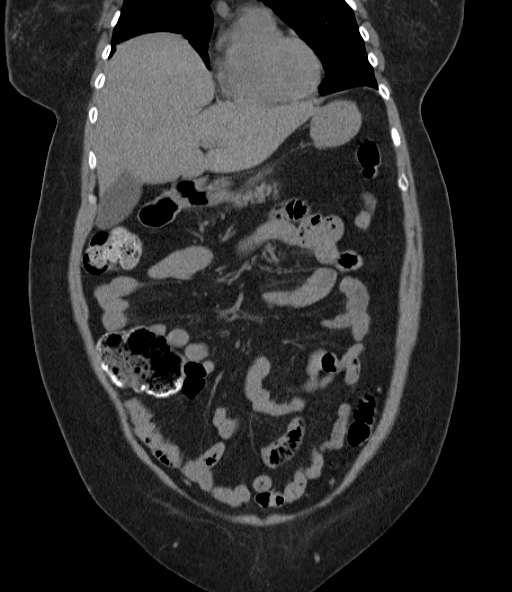
[im 38/85  soft-tissue]
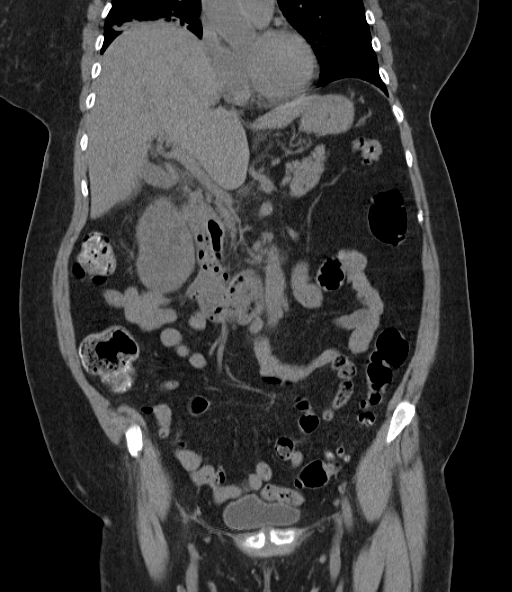
[im 47/85  soft-tissue]
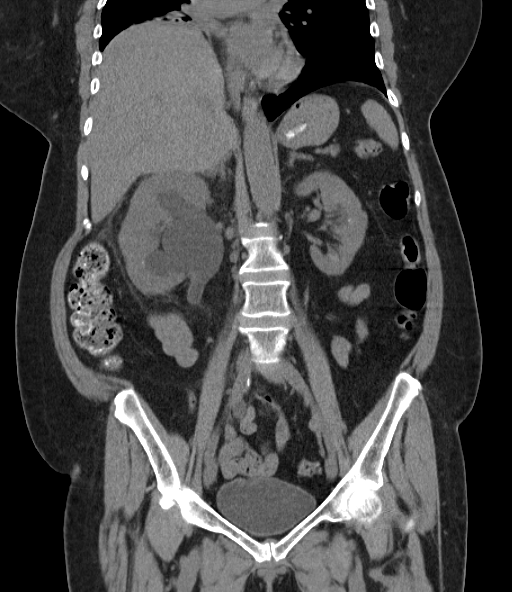

[15 of 46 positions shown; findings below may reference images not displayed]

FINDINGS: Lower chest: Lung bases shows mild atelectasis in left lower lobe
posteriorly.

Hepatobiliary: Unenhanced liver shows no biliary ductal dilatation.
No calcified gallstones are noted within gallbladder. No CBD
dilatation.

Pancreas: Unenhanced pancreas is unremarkable.

Spleen: Unenhanced spleen is stable in size grossly unremarkable.

Adrenals/Urinary Tract: No adrenal gland mass is noted. There is
moderate right hydronephrosis and right hydroureter with slight
progression from prior exam. There is right perinephric and mild
right periureteral stranding. Axial image 78 there is 4.5 mm
calcified obstructive calculus in distal right ureter about 4.5 cm
from right UVJ. At least 2 punctate calcified nonobstructive calculi
are noted within right kidney the largest in upper pole measures 2
mm. Multiple punctate nonobstructive calcified calculi are noted
within left kidney the largest in lower pole measures 3.7 mm. No
left hydronephrosis. No left ureteral calculi. Bilateral distal
ureter is unremarkable. No calcified calculi are noted within
urinary bladder.

Stomach/Bowel: No gastric outlet obstruction. No small bowel
obstruction. Normal appendix is noted in axial image 69. No
pericecal inflammation. The terminal ileum is unremarkable.

No distal colonic obstruction. Some colonic gas noted within rectum.
Sigmoid colon diverticula are noted with no evidence of acute
diverticulitis. Scattered diverticula are noted descending colon. No
evidence of acute diverticulitis.

Vascular/Lymphatic: No aortic aneurysm. Mild atherosclerotic
calcifications of distal abdominal aorta and and common iliac
arteries. No mesenteric or retroperitoneal adenopathy.

Reproductive: The patient is status post hysterectomy.

Other: No ascites or free air. Tiny umbilical hernia containing fat
without evidence of acute complication. There is no inguinal
adenopathy.

Musculoskeletal: Mild degenerative changes lower thoracic spine.
Minimal degenerative changes lumbar spine. No destructive bony
lesions are noted within pelvis. Mild degenerative changes pubic
symphysis. Mild degenerative changes right SI joint.
IMPRESSION: 1. There is moderate right hydronephrosis and right hydroureter with
progression from prior exam. Significant right perinephric
stranding.
2. Axial image 78 there is 4.5 mm calcified obstructive calculus in
distal right ureter about 4.5 cm from right UVJ.
3. Bilateral nonobstructive nephrolithiasis.
4. No left hydronephrosis or ureteral calculi. No calcified calculi
are noted within urinary bladder.
5. Normal appendix.  No pericecal inflammation.
6. No small bowel obstruction.
7. Status post hysterectomy.

## 2018-02-12 ENCOUNTER — Telehealth: Payer: Self-pay | Admitting: Family Medicine

## 2018-02-12 ENCOUNTER — Other Ambulatory Visit: Payer: Self-pay | Admitting: Family Medicine

## 2018-02-12 NOTE — Telephone Encounter (Signed)
Pt stopped by to see if you would write a RX for a ultrasound for the back of her neck, she thinks its the same as her arm but not round.  Pt would also like a ultrasound of the stomach area also, she had a colonoscopy and they found polyps 20,, and the Dr was removing them, and during the procedure she sniped a HOLE in her GUT. Her stomach is swelling, and she states its getting worse since the surgery .Marland Kitchen   Please call and advise if you will write her a order for Ultrasounds.

## 2018-02-14 NOTE — Telephone Encounter (Signed)
Cannot be done without appt. Scheduled for Monday sept 9

## 2018-02-17 ENCOUNTER — Ambulatory Visit (HOSPITAL_COMMUNITY)
Admission: RE | Admit: 2018-02-17 | Discharge: 2018-02-17 | Disposition: A | Discharge status: Home / Self Care | Payer: Medicare Other | Source: Ambulatory Visit | Attending: Family Medicine | Admitting: Family Medicine

## 2018-02-17 ENCOUNTER — Encounter: Payer: Self-pay | Admitting: Family Medicine

## 2018-02-17 ENCOUNTER — Other Ambulatory Visit: Payer: Self-pay

## 2018-02-17 ENCOUNTER — Ambulatory Visit (INDEPENDENT_AMBULATORY_CARE_PROVIDER_SITE_OTHER): Payer: Medicare Other | Admitting: Family Medicine

## 2018-02-17 VITALS — BP 120/52 | HR 88 | Resp 12 | Ht 66.0 in | Wt 209.1 lb

## 2018-02-17 DIAGNOSIS — I10 Essential (primary) hypertension: Secondary | ICD-10-CM

## 2018-02-17 DIAGNOSIS — R0902 Hypoxemia: Secondary | ICD-10-CM | POA: Diagnosis not present

## 2018-02-17 DIAGNOSIS — F5104 Psychophysiologic insomnia: Secondary | ICD-10-CM

## 2018-02-17 DIAGNOSIS — M25512 Pain in left shoulder: Secondary | ICD-10-CM | POA: Diagnosis not present

## 2018-02-17 DIAGNOSIS — R0600 Dyspnea, unspecified: Secondary | ICD-10-CM

## 2018-02-17 DIAGNOSIS — J9811 Atelectasis: Secondary | ICD-10-CM | POA: Diagnosis not present

## 2018-02-17 DIAGNOSIS — F411 Generalized anxiety disorder: Secondary | ICD-10-CM

## 2018-02-17 DIAGNOSIS — R0609 Other forms of dyspnea: Secondary | ICD-10-CM | POA: Diagnosis not present

## 2018-02-17 DIAGNOSIS — G8929 Other chronic pain: Secondary | ICD-10-CM

## 2018-02-17 DIAGNOSIS — Z23 Encounter for immunization: Secondary | ICD-10-CM | POA: Diagnosis not present

## 2018-02-17 DIAGNOSIS — R509 Fever, unspecified: Secondary | ICD-10-CM | POA: Insufficient documentation

## 2018-02-17 DIAGNOSIS — R221 Localized swelling, mass and lump, neck: Secondary | ICD-10-CM

## 2018-02-17 DIAGNOSIS — Z9189 Other specified personal risk factors, not elsewhere classified: Secondary | ICD-10-CM

## 2018-02-17 MED ORDER — TRAZODONE HCL 100 MG PO TABS: 100.0000 mg | ORAL_TABLET | Freq: Every day | ORAL | 4 refills | Status: DC

## 2018-02-17 MED ORDER — ALPRAZOLAM 0.5 MG PO TABS: ORAL_TABLET | ORAL | 0 refills | Status: DC

## 2018-02-17 NOTE — Progress Notes (Signed)
Cheryl Abbott     MRN: [id number]      DOB: Jan 20, 1946   HPI [name] is here for follow up and re-evaluation of chronic medical conditions, medication management and review of any available recent lab and radiology data.  . 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 Denies sinus pressure, nasal congestion, ear pain or sore throat. Denies chest congestion, productive cough or wheezing. C/o increased shortness of breath with activity Denies abdominal pain, nausea, vomiting,diarrhea or constipation.   Denies dysuria, frequency, hesitancy or incontinence.  Denies headaches, seizures, numbness, or tingling. Denies depression, anxiety c/o uncontrolled  insomnia. Denies skin break down or rash.   PE  BP (!) 120/52 (BP Location: Left Arm, Patient Position: Sitting, Cuff Size: Large)   Pulse 88   Resp 12   Ht 5\' 6"  (1.676 m)   Wt 209 lb 1.9 oz (94.9 kg)   BMI 33.75 kg/m   Patient alert and oriented and in no cardiopulmonary distress.  HEENT: No facial asymmetry, EOMI,   oropharynx pink and moist.  Neck supple no JVD, lipoma on posterior neck  Chest: Decreased air entry with scattered wheezes high pitched,  Few crackles  CVS: S1, S2 no murmurs, no S3.Regular rate.  ABD: Soft non tender.   Ext: No edema  MS: Adequate though reduced  ROM lumbar  Spine,normal in  shoulders, hips and knees.  Skin: Intact, no  ulcerations or rash noted.Lipoma on posterior neck  Psych: Good eye contact, normal affect. Memory intact mildly  anxious not depressed appearing.  CNS: CN 2-12 intact, power,  normal throughout.no focal deficits noted.   Assessment & Plan  Shoulder pain, left 2 week h/o left shoulder pain, no trauma, refer to ortho for eval and management  Palpable mass of neck Posterior neck mass, likely lipoma, refer to surgery for eval and management  Generalized anxiety disorder Goal is to wean pt off of xanax , she is on an SSRI for depression and anxiety, she understands and is in agreement  Hypertension goal BP (blood pressure) < 130/80 Controlled, no change in medication DASH diet and commitment to daily physical activity for a minimum of 30 minutes discussed and encouraged, as a part of hypertension management. The importance of attaining a healthy weight is also discussed.  BP/Weight 02/17/2018 12/11/2017 11/29/2017 11/22/2017 11/08/2017 11/05/2017 06/16/2692  Systolic BP 854 627 035 009 [phone]  Diastolic BP 52 70 60 66 67 90 72  Wt. (Lbs) 209.12 206.12 - - 209.56 211.04 -  BMI 33.75 33.27 - - 33.82 34.06 -       Insomnia Uncontrolled , medication dose increased and sleep hygiene reviewed  Exertional dyspnea C/o approx 2 month h/o increased exertional dyspnea, has multiple CV risk factors, will refer to cardiology for eval, visit is overdue  Morbid obesity ([place]) Deteriorated. Patient re-educated about  the importance of commitment to a  minimum of 150 minutes of exercise  per week.  The importance of healthy food choices with portion control discussed. Encouraged to start a food diary, count calories and to consider  joining a support group. Sample diet sheets offered. Goals set by the patient for the next several months.   Weight /BMI 02/17/2018 12/11/2017 11/08/2017  WEIGHT 209 lb 1.9 oz 206 lb 1.9 oz 209 lb 9 oz  HEIGHT 5\' 6"  5\' 6"  5\' 6"   BMI 33.75 kg/m2 33.27 kg/m2 33.82 kg/m2

## 2018-02-17 NOTE — Patient Instructions (Addendum)
F/U in 3 months, call if you need me before  Mammogram due in November , please schedule  Flu vaccine today  Oxygen falls when you walk so you do need supplemental oxygen, you need a CXR today  I will provide a letter of permanent excuse from jury duty based on medical grounds  You will be referred to Dr Arnoldo Morale re swelling on back of neck  You need  CXR today, please go to the hospital this is ordered  You are being referred to the heart  Specialist in [place] due to fatigue  You have a higher dose of medication for sleep and very little alprazolam on;luy if you are extremely anxious , not for regular use for sleeep

## 2018-02-18 NOTE — Telephone Encounter (Signed)
Pt seen in office  On 02/17/2018

## 2018-02-21 ENCOUNTER — Other Ambulatory Visit: Payer: Self-pay | Admitting: Family Medicine

## 2018-02-24 ENCOUNTER — Telehealth: Payer: Self-pay | Admitting: Family Medicine

## 2018-02-24 NOTE — Telephone Encounter (Signed)
Pt LVM that she needs a inhaler called into [facility]

## 2018-02-25 ENCOUNTER — Other Ambulatory Visit: Payer: Self-pay | Admitting: Family Medicine

## 2018-02-25 NOTE — Telephone Encounter (Signed)
I don't see an inhaler on her current list. Do you want me to refill a past one?

## 2018-02-28 ENCOUNTER — Other Ambulatory Visit: Payer: Self-pay | Admitting: Family Medicine

## 2018-02-28 DIAGNOSIS — M25512 Pain in left shoulder: Secondary | ICD-10-CM | POA: Insufficient documentation

## 2018-02-28 DIAGNOSIS — R0609 Other forms of dyspnea: Secondary | ICD-10-CM | POA: Insufficient documentation

## 2018-02-28 DIAGNOSIS — G8929 Other chronic pain: Secondary | ICD-10-CM | POA: Insufficient documentation

## 2018-02-28 DIAGNOSIS — R0600 Dyspnea, unspecified: Secondary | ICD-10-CM | POA: Insufficient documentation

## 2018-02-28 DIAGNOSIS — R221 Localized swelling, mass and lump, neck: Secondary | ICD-10-CM | POA: Insufficient documentation

## 2018-02-28 MED ORDER — BUDESONIDE-FORMOTEROL FUMARATE 80-4.5 MCG/ACT IN AERO: 2.0000 | INHALATION_SPRAY | Freq: Two times a day (BID) | RESPIRATORY_TRACT | 3 refills | Status: DC

## 2018-02-28 MED ORDER — AZITHROMYCIN 250 MG PO TABS: ORAL_TABLET | ORAL | 0 refills | Status: DC

## 2018-02-28 NOTE — Assessment & Plan Note (Signed)
2 week h/o left shoulder pain, no trauma, refer to ortho for eval and management

## 2018-02-28 NOTE — Assessment & Plan Note (Signed)
C/o approx 2 month h/o increased exertional dyspnea, has multiple CV risk factors, will refer to cardiology for eval, visit is overdue

## 2018-02-28 NOTE — Telephone Encounter (Signed)
I left a messge  On her phone so she may call back, you can let her know that inhaler is sent.as she requested, I am also sending a note about her cXR to you to pass on when/if  she calls

## 2018-02-28 NOTE — Assessment & Plan Note (Signed)
Deteriorated. Patient re-educated about  the importance of commitment to a  minimum of 150 minutes of exercise per week.  The importance of healthy food choices with portion control discussed. Encouraged to start a food diary, count calories and to consider  joining a support group. Sample diet sheets offered. Goals set by the patient for the next several months.   Weight /BMI 02/17/2018 12/11/2017 11/08/2017  WEIGHT 209 lb 1.9 oz 206 lb 1.9 oz 209 lb 9 oz  HEIGHT 5\' 6"  5\' 6"  5\' 6"   BMI 33.75 kg/m2 33.27 kg/m2 33.82 kg/m2

## 2018-02-28 NOTE — Assessment & Plan Note (Signed)
Uncontrolled , medication dose increased and sleep hygiene reviewed

## 2018-02-28 NOTE — Assessment & Plan Note (Signed)
Posterior neck mass, likely lipoma, refer to surgery for eval and management

## 2018-02-28 NOTE — Assessment & Plan Note (Signed)
Goal is to wean pt off of xanax , she is on an SSRI for depression and anxiety, she understands and is in agreement

## 2018-02-28 NOTE — Progress Notes (Signed)
symbicort send in for SOB, h/o smoking and laso has abn CXR

## 2018-02-28 NOTE — Telephone Encounter (Signed)
Record review and CXR , I will prescribe inhaler for her to see if this helps with her SOB and this is sent to her local pharmacy, I tried to inform her today, but she did not answer

## 2018-02-28 NOTE — Assessment & Plan Note (Signed)
Controlled, no change in medication DASH diet and commitment to daily physical activity for a minimum of 30 minutes discussed and encouraged, as a part of hypertension management. The importance of attaining a healthy weight is also discussed.  BP/Weight 02/17/2018 12/11/2017 11/29/2017 11/22/2017 11/08/2017 11/05/2017 03/01/1940  Systolic BP 740 814 481 [phone] 263  Diastolic BP 52 70 60 66 67 90 72  Wt. (Lbs) 209.12 206.12 - - 209.56 211.04 -  BMI 33.75 33.27 - - 33.82 34.06 -

## 2018-03-04 NOTE — Telephone Encounter (Signed)
Have called twice and left messages both times requesting call back.

## 2018-03-05 ENCOUNTER — Telehealth: Payer: Self-pay

## 2018-03-05 DIAGNOSIS — R0609 Other forms of dyspnea: Secondary | ICD-10-CM

## 2018-03-05 DIAGNOSIS — R0600 Dyspnea, unspecified: Secondary | ICD-10-CM

## 2018-03-05 NOTE — Telephone Encounter (Signed)
DME order entered for light weight portable oxygen tank. Order and last ov notes faxed to [facility]

## 2018-03-05 NOTE — Telephone Encounter (Signed)
Spoke with patient and gave results with verbal understanding.  

## 2018-03-10 ENCOUNTER — Other Ambulatory Visit (HOSPITAL_COMMUNITY): Payer: Self-pay | Admitting: *Deleted

## 2018-03-10 DIAGNOSIS — C9000 Multiple myeloma not having achieved remission: Secondary | ICD-10-CM

## 2018-03-11 ENCOUNTER — Inpatient Hospital Stay (HOSPITAL_COMMUNITY): Payer: Medicare Other | Attending: Hematology

## 2018-03-11 ENCOUNTER — Encounter (HOSPITAL_COMMUNITY): Payer: Self-pay | Admitting: Emergency Medicine

## 2018-03-11 ENCOUNTER — Emergency Department (HOSPITAL_COMMUNITY)
Admission: EM | Admit: 2018-03-11 | Discharge: 2018-03-11 | Disposition: A | Discharge status: Home / Self Care | Payer: Medicare Other | Attending: Emergency Medicine | Admitting: Emergency Medicine

## 2018-03-11 ENCOUNTER — Emergency Department (HOSPITAL_COMMUNITY): Payer: Medicare Other

## 2018-03-11 ENCOUNTER — Telehealth: Payer: Self-pay | Admitting: Family Medicine

## 2018-03-11 ENCOUNTER — Other Ambulatory Visit: Payer: Self-pay

## 2018-03-11 DIAGNOSIS — E119 Type 2 diabetes mellitus without complications: Secondary | ICD-10-CM | POA: Insufficient documentation

## 2018-03-11 DIAGNOSIS — Z79899 Other long term (current) drug therapy: Secondary | ICD-10-CM | POA: Insufficient documentation

## 2018-03-11 DIAGNOSIS — D472 Monoclonal gammopathy: Secondary | ICD-10-CM | POA: Insufficient documentation

## 2018-03-11 DIAGNOSIS — Z7982 Long term (current) use of aspirin: Secondary | ICD-10-CM | POA: Diagnosis not present

## 2018-03-11 DIAGNOSIS — N183 Chronic kidney disease, stage 3 (moderate): Secondary | ICD-10-CM | POA: Diagnosis not present

## 2018-03-11 DIAGNOSIS — I129 Hypertensive chronic kidney disease with stage 1 through stage 4 chronic kidney disease, or unspecified chronic kidney disease: Secondary | ICD-10-CM | POA: Diagnosis not present

## 2018-03-11 DIAGNOSIS — R9431 Abnormal electrocardiogram [ECG] [EKG]: Secondary | ICD-10-CM | POA: Diagnosis not present

## 2018-03-11 DIAGNOSIS — Z87891 Personal history of nicotine dependence: Secondary | ICD-10-CM | POA: Insufficient documentation

## 2018-03-11 DIAGNOSIS — R531 Weakness: Secondary | ICD-10-CM | POA: Diagnosis not present

## 2018-03-11 DIAGNOSIS — R5381 Other malaise: Secondary | ICD-10-CM | POA: Diagnosis not present

## 2018-03-11 DIAGNOSIS — C9000 Multiple myeloma not having achieved remission: Secondary | ICD-10-CM

## 2018-03-11 DIAGNOSIS — Z859 Personal history of malignant neoplasm, unspecified: Secondary | ICD-10-CM | POA: Insufficient documentation

## 2018-03-11 DIAGNOSIS — N390 Urinary tract infection, site not specified: Secondary | ICD-10-CM | POA: Diagnosis not present

## 2018-03-11 DIAGNOSIS — R0602 Shortness of breath: Secondary | ICD-10-CM | POA: Diagnosis not present

## 2018-03-11 LAB — COMPREHENSIVE METABOLIC PANEL
ALT: 24 U/L (ref 0–44)
AST: 25 U/L (ref 15–41)
Albumin: 4.1 g/dL (ref 3.5–5.0)
Alkaline Phosphatase: 98 U/L (ref 38–126)
Anion gap: 8 (ref 5–15)
BUN: 10 mg/dL (ref 8–23)
CO2: 25 mmol/L (ref 22–32)
Calcium: 9.8 mg/dL (ref 8.9–10.3)
Chloride: 104 mmol/L (ref 98–111)
Creatinine, Ser: 1.35 mg/dL — ABNORMAL HIGH (ref 0.44–1.00)
GFR calc Af Amer: 44 mL/min — ABNORMAL LOW (ref >60)
GFR calc non Af Amer: 38 mL/min — ABNORMAL LOW (ref >60)
Glucose, Bld: 162 mg/dL — ABNORMAL HIGH (ref 70–99)
Potassium: 3.6 mmol/L (ref 3.5–5.1)
Sodium: 137 mmol/L (ref 135–145)
Total Bilirubin: 0.5 mg/dL (ref 0.3–1.2)
Total Protein: 9.1 g/dL — ABNORMAL HIGH (ref 6.5–8.1)

## 2018-03-11 LAB — URINALYSIS, ROUTINE W REFLEX MICROSCOPIC
Bacteria, UA: NONE SEEN
Bilirubin Urine: NEGATIVE
Glucose, UA: 50 mg/dL — AB
Ketones, ur: NEGATIVE mg/dL
Nitrite: NEGATIVE
Protein, ur: 30 mg/dL — AB
Specific Gravity, Urine: 1.017 (ref 1.005–1.030)
pH: 5 (ref 5.0–8.0)

## 2018-03-11 LAB — FERRITIN: Ferritin: 149 ng/mL (ref 11–307)

## 2018-03-11 LAB — CBC WITH DIFFERENTIAL/PLATELET
Basophils Absolute: 0 10*3/uL (ref 0.0–0.1)
Basophils Relative: 0 %
Eosinophils Absolute: 0.2 10*3/uL (ref 0.0–0.7)
Eosinophils Relative: 3 %
HCT: 41.5 % (ref 36.0–46.0)
Hemoglobin: 14 g/dL (ref 12.0–15.0)
Lymphocytes Relative: 22 %
Lymphs Abs: 1.8 10*3/uL (ref 0.7–4.0)
MCH: 33.7 pg (ref 26.0–34.0)
MCHC: 33.7 g/dL (ref 30.0–36.0)
MCV: 99.8 fL (ref 78.0–100.0)
Monocytes Absolute: 0.6 10*3/uL (ref 0.1–1.0)
Monocytes Relative: 8 %
Neutro Abs: 5.4 10*3/uL (ref 1.7–7.7)
Neutrophils Relative %: 67 %
Platelets: 254 10*3/uL (ref 150–400)
RBC: 4.16 MIL/uL (ref 3.87–5.11)
RDW: 15.5 % (ref 11.5–15.5)
WBC: 8 10*3/uL (ref 4.0–10.5)

## 2018-03-11 LAB — LACTATE DEHYDROGENASE: LDH: 100 U/L (ref 98–192)

## 2018-03-11 MED ORDER — CIPROFLOXACIN HCL 500 MG PO TABS: 500.0000 mg | ORAL_TABLET | Freq: Two times a day (BID) | ORAL | 0 refills | Status: DC

## 2018-03-11 MED ORDER — CIPROFLOXACIN HCL 250 MG PO TABS
500.0000 mg | ORAL_TABLET | Freq: Once | ORAL | Status: AC
  Administered 2018-03-11: 500 mg via ORAL
  Filled 2018-03-11: qty 2

## 2018-03-11 NOTE — ED Notes (Signed)
Pt just had blood work done this am. Pt states she was hoping they would admit her as she has other appts this week at hospital.

## 2018-03-11 NOTE — ED Notes (Signed)
Pt ambulatory to waiting room. Pt verbalized understanding of discharge instructions.   

## 2018-03-11 NOTE — ED Notes (Signed)
Pt to xray via stretcher

## 2018-03-11 NOTE — Discharge Instructions (Addendum)
The testing today indicates that you have a urinary tract infection.  Start taking the antibiotic prescription tomorrow.  It was sent to your pharmacy.  Sure you are eating 3 meals a day and drinking plenty of fluids.  Follow-up for all of your upcoming appointments as scheduled.  They are listed on this document.

## 2018-03-11 NOTE — ED Triage Notes (Addendum)
SOB, no energy and no appetite for weeks.  Pt states no new s/s.  States she was at the hospital getting blood drawn and decided to come to ED

## 2018-03-11 NOTE — ED Notes (Signed)
Pt was informed that we need a urine sample. Pt sates that she can try to urinate. Pt tried to urinate, but could not.

## 2018-03-11 NOTE — Telephone Encounter (Signed)
Return patients call Friday to discuss upcoming appts

## 2018-03-11 NOTE — ED Provider Notes (Signed)
[facility] EMERGENCY DEPARTMENT Provider Note   CSN: [id number] Arrival date & time: 03/11/18  1355     History   Chief Complaint Chief Complaint  Patient presents with  . Weakness    HPI Cheryl Abbott is a 72 y.o. female.  HPI  She is here for evaluation of general tired feeling, and feeling overwhelmed by her multiple upcoming appointments.  She reported to triage staff that she want to be admitted to the hospital because of a number of upcoming appointments.  She was at this hospital today to get blood drawn for an upcoming oncology appointment in 1 week.  Patient drove her vehicle to the hospital for this testing.  She has a routine follow-up appointment with cardiology in 3 days.  She has multiple myeloma which is being followed by surveillance.  She denies fever, chills, cough, chest pain, shortness of breath, nausea or vomiting.  There are no other known modifying factors.   Past Medical History:  Diagnosis Date  . Anemia   . Anxiety   . Anxiety disorder   . Arthritis   . Cancer ([place])   . 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 Chronic back pain   . Depression   . Fatty liver   . GERD (gastroesophageal reflux disease)   . Headache   . History of adenomatous polyp of colon    2008  . History of kidney stones   . Hypercalcemia   . Hyperlipidemia   . Hypertension   . Left ureteral stone   . Lipoma of arm 01/19/2015  . Lumbar disc disease with radiculopathy   . Nephrolithiasis   . Neuropathy, peripheral   . OSA treated with BiPAP    per study 2007  . Sciatica   . Sigmoid diverticulosis   . Type 2 diabetes mellitus [facility])     Patient Active Problem List   Diagnosis Date Noted  . Shoulder pain, left 02/28/2018  . Palpable mass of neck 02/28/2018  . Exertional dyspnea 02/28/2018  . Insomnia 11/09/2017  . Generalized pain 11/09/2017  . Multiple myeloma ([place]) 03/15/2017  . Urinary frequency 02/28/2017  . SIRS (systemic inflammatory response  syndrome) ([place]) 02/26/2017  . 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 Morbid obesity ([place]) 10/14/2016  . Vaginal dryness, menopausal 10/14/2016  . Neuropathic pain of both feet 04/11/2016  . TIA (transient ischemic attack) 02/26/2016  . Lactose intolerance in adult 02/01/2016  . Brain TIA 08/19/2015  . CKD (chronic kidney disease) stage 3, GFR 30-59 ml/min (HCC) 07/12/2015  . Lipoma of arm 01/19/2015  . Anemia 10/14/2014  . Lumbar back pain with radiculopathy affecting left lower extremity 05/11/2014  . Nocturnal hypoxia 02/04/2014  . Generalized anxiety disorder 05/20/2013  . Anxiety and depression 08/13/2012  . Abnormal brain scan 02/07/2012  . Thoracic or lumbosacral neuritis or radiculitis, unspecified 12/22/2010  . Hypercalcemia 11/30/2010  . Colon adenomas 05/11/2009  . HEMORRHOIDS 03/15/2009  . GERD 03/15/2009  . FATTY LIVER DISEASE 03/15/2009  . RENAL CALCULUS 03/15/2009  . Sleep apnea 09/15/2008  . 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 Hyperlipidemia LDL goal <100 09/17/2007  . Hypertension goal BP (blood pressure) < 130/80 09/17/2007    Past Surgical History:  Procedure Laterality Date  . BIOPSY N/A 03/17/2013   Procedure: GASTRIC BIOPSIES;  Surgeon: Danie Binder, MD;  Location: [facility] OR[facility];  Service: Endoscopy;  Laterality: N/A;  . BIOPSY  06/10/2015   Procedure: BIOPSY;  Surgeon: Danie Binder, MD;  Location: [facility]  ENDO SUITE;  Service: Endoscopy;;  gastric biopsy  . CARDIAC CATHETERIZATION  05-11-2003  dr Shelva Majestic ([facility])   Abnormal cardiolite/   Normal coronary arteries and normal LVF,  ef  63%  . CARDIOVASCULAR STRESS TEST  01-01-2014   normal lexiscan cardiolite/  no ischemia/ infarct/  normal LV function and wall motion ,  ef 81%  . COLONOSCOPY  last one 10/02/2011   MOD [facility] TICS, IH, NEXT TCS APR 2018  . COLONOSCOPY WITH PROPOFOL N/A 02/26/2017   Procedure: COLONOSCOPY WITH PROPOFOL;  Surgeon: Danie Binder, MD;  Location: [facility]  ENDO SUITE;  Service: Endoscopy;  Laterality: N/A;  11:00am  . CYSTO/   RIGHT URETEROSOCOPY LASER LITHOTRIPSY STONE EXTRACTION  10-13-2004  . CYSTO/  RIGHT RETROGRADE PYELOGRAM/  PLACMENT RIGHT URETERAL STENT  01-10-2010  . CYSTOSCOPY W/ URETERAL STENT PLACEMENT Right 08/19/2015   Procedure: CYSTOSCOPY WITH RETROGRADE PYELOGRAM/URETERAL STENT PLACEMENT;  Surgeon: Franchot Gallo, MD;  Location: [facility];  Service: Urology;  Laterality: Right;  . CYSTOSCOPY WITH RETROGRADE PYELOGRAM, URETEROSCOPY AND STENT PLACEMENT Left 10/21/2014   Procedure: CYSTOSCOPY WITH RETROGRADE PYELOGRAM, URETEROSCOPY AND STENT PLACEMENT;  Surgeon: Franchot Gallo, MD;  Location[facility];  Service: Urology;  Laterality: Left;  . CYSTOSCOPY WITH RETROGRADE PYELOGRAM, URETEROSCOPY AND STENT PLACEMENT Right 11/22/2015   Procedure: CYSTOSCOPY, RIGHT URETERAL STENT REMOVAL; RIGHT RETROGRADE PYELOGRAM, RIGHT URETEROSCOPY WITH BALLOON DILATION; RIGHT URETERAL STENT PLACEMENT;  Surgeon: Franchot Gallo, MD;  Location: [facility];  Service: Urology;  Laterality: Right;  . CYSTOSCOPY/RETROGRADE/URETEROSCOPY/STONE EXTRACTION WITH BASKET Bilateral 08/02/2015   Procedure: CYSTOSCOPY; BILATERAL RETROGRADE PYELOGRAMS; BILATERAL URETEROSCOPY, STONE EXTRACTION WITH BASKET;  Surgeon: Franchot Gallo, MD;  Location: [facility];  Service: Urology;  Laterality: Bilateral;  . CYSTOSCOPY/RETROGRADE/URETEROSCOPY/STONE EXTRACTION WITH BASKET Right 06/28/2015   Procedure: CYSTOSCOPY/RETROGRADE/URETEROSCOPY/STONE EXTRACTION WITH BASKET, RIGHT URETERAL DOUBLE J STENT PLACEMENT;  Surgeon: Franchot Gallo, MD;  Location: [facility];  Service: Urology;  Laterality: Right;  . ESOPHAGOGASTRODUODENOSCOPY (EGD) WITH PROPOFOL N/A 03/17/2013   Procedure: ESOPHAGOGASTRODUODENOSCOPY (EGD) WITH PROPOFOL;  Surgeon: Danie Binder, MD;  Location: [facility];  Service: Endoscopy;  Laterality: N/A;  . ESOPHAGOGASTRODUODENOSCOPY (EGD) WITH PROPOFOL N/A 06/10/2015     Distal gastritis, distal esophageal stricture s/p dilation  . ESOPHAGOGASTRODUODENOSCOPY (EGD) WITH PROPOFOL N/A 02/26/2017   Procedure: ESOPHAGOGASTRODUODENOSCOPY (EGD) WITH PROPOFOL;  Surgeon: Danie Binder, MD;  Location[facility];  Service: Endoscopy;  Laterality: N/A;  . FLEXIBLE SIGMOIDOSCOPY  09/11/2011   [id number] Internal hemorrhoids  . HOLMIUM LASER APPLICATION Left 12/21/4578   Procedure: HOLMIUM LASER APPLICATION;  Surgeon: Franchot Gallo, MD;  Location: [facility];  Service: Urology;  Laterality: Left;  . HOLMIUM LASER APPLICATION Right 9/98/3382   Procedure: HOLMIUM LASER APPLICATION;  Surgeon: Franchot Gallo, MD;  Location: [facility];  Service: Urology;  Laterality: Right;  . MASS EXCISION Left 03/04/2015   Procedure: EXCISION OF SOFT TISSUE NEOPLASM LEFT ARM;  Surgeon: Aviva Signs, MD;  Location: [facility];  Service: General;  Laterality: Left;  . PERCUTANEOUS NEPHROSTOLITHOTOMY Bilateral 12/  2011     [facility]  . POLYPECTOMY N/A 03/17/2013   Procedure: GASTRIC POLYPECTOMY;  Surgeon: Danie Binder, MD;  Location: [facility];  Service: Endoscopy;  Laterality: N/A;  . POLYPECTOMY  02/26/2017   Procedure: POLYPECTOMY;  Surgeon: Danie Binder, MD;  Location: [facility];  Service: Endoscopy;;  polyp at cecum, ascending colon polyps x3, hepatic flexure polyps x8, transverse colon polyps x8   . REMOVAL RIGHT THIGH CYST  2006  . SAVORY DILATION N/A 03/17/2013   Procedure: SAVORY DILATION;  Surgeon: Danie Binder, MD;  Location: [facility];  Service: Endoscopy;  Laterality: N/A;  #12.8, 14, 15, 16 dilators used  . SAVORY DILATION N/A 06/10/2015   Procedure: SAVORY DILATION;  Surgeon: Danie Binder, MD;  Location[facility];  Service: Endoscopy;  Laterality: N/A;  . TRANSTHORACIC ECHOCARDIOGRAM  01-01-2014   mild LVH/  ef 21-19%/  grade I diastolic dysfunction/  trivial MR, TR, and PR  . VAGINAL HYSTERECTOMY  1970's     OB History    Gravida  2   Para  2    Term  2   Preterm      AB      Living  2     SAB      TAB      Ectopic      Multiple      Live Births               Home Medications    Prior to Admission medications   Medication Sig Start Date End Date Taking? Authorizing Provider  ALPRAZolam Duanne Moron) 0.5 MG tablet One tablet once daily as needed, for anxiety Patient taking differently: Take 0.5 mg by mouth daily as needed for anxiety.  12/11/17 05/14/19 Yes [name], MD  amLODipine (NORVASC) 10 MG tablet TAKE 1 TABLET BY MOUTH EVERY DAY Patient taking differently: Take 10 mg by mouth daily.  12/06/17 03/11/18 Yes [name], MD  aspirin EC 325 MG tablet Take 325 mg by mouth daily.   Yes [provider]  budesonide-formoterol (SYMBICORT) 80-4.5 MCG/ACT inhaler Inhale 2 puffs into the lungs 2 (two) times daily. 02/28/18  Yes [name], MD  diphenhydramine-acetaminophen (TYLENOL PM) 25-500 MG TABS tablet Take 2 tablets by mouth at bedtime.   Yes [provider]  docusate sodium (COLACE) 100 MG capsule Take 200 mg by mouth at bedtime.    Yes [provider]  iron polysaccharides (NIFEREX) 150 MG capsule Take 150 mg by mouth daily.   Yes [provider]  lovastatin (MEVACOR) 40 MG tablet TAKE 2 TABLETS (80 MG TOTAL) BY MOUTH AT BEDTIME. Patient taking differently: Take 40 mg by mouth at bedtime.  01/13/18  Yes [name], MD  OXYGEN Inhale into the lungs daily. Bedtime   Yes [provider]  pantoprazole (PROTONIX) 40 MG tablet TAKE 1 TABLET (40 MG TOTAL) BY MOUTH 2 (TWO) TIMES DAILY. 02/24/18  Yes [name], MD  PARoxetine (PAXIL) 40 MG tablet TAKE 1 TABLET BY MOUTH EVERY DAY IN THE MORNING Patient taking differently: Take 40 mg by mouth every morning.  01/15/18  Yes [name], MD  potassium chloride SA (K-DUR,KLOR-CON) 20 MEQ tablet Take 1 tablet (20 mEq total) by mouth daily. 09/10/17  Yes Branch, Alphonse Guild, MD  tiZANidine  (ZANAFLEX) 2 MG tablet TAKE 1 TABLET BY MOUTH TWICE A DAY AS NEEDED FOR MUSCLE SPASM Patient taking differently: Take 2 mg by mouth 2 (two) times daily as needed for muscle spasms.  02/12/18  Yes [name], MD  TRADJENTA 5 MG TABS tablet TAKE 1 TABLET BY MOUTH EVERY DAY Patient taking differently: Take 5 mg by mouth daily.  02/26/18  Yes [name], MD  traZODone (DESYREL) 100 MG tablet Take 1 tablet (100 mg total) by mouth at bedtime. 02/17/18  Yes [name], MD  ALPRAZolam Duanne Moron) 0.5 MG tablet One  tablet at bedtime as needed for uncontrolled anxiety Patient not taking: Reported on 03/11/2018 02/17/18   [name], MD  azithromycin [facility]) 250 MG tablet Take two tablets on day one, then take one tablet once daily for an additional four days Patient not taking: Reported on 03/11/2018 02/28/18   [name], MD  ciprofloxacin (CIPRO) 500 MG tablet Take 1 tablet (500 mg total) by mouth 2 (two) times daily. One po bid x 7 days 03/11/18   Daleen Bo, MD    Family History Family History  Problem Relation Age of Onset  . Hypertension Mother   . Diabetes Mother   . Heart failure Mother   . Dementia Mother   . Emphysema Father   . Hypertension Father   . Diabetes Brother   . GER disease Brother   . Hypertension Sister   . Hypertension Sister   . Cancer Unknown        family history   . Diabetes Unknown        family history   . Heart defect Unknown        famiily history   . Arthritis Unknown        family history   . Anesthesia problems Neg Hx   . Hypotension Neg Hx   . Malignant hyperthermia Neg Hx   . Pseudochol deficiency Neg Hx   . Colon cancer Neg Hx     Social History Social History   Tobacco Use  . Smoking status: Former Smoker    Packs/day: 1.00    Years: 20.00    Pack years: 20.00    Types: Cigarettes    Start date: 09/21/1974    Last attempt to quit: 09/20/1988    Years since quitting: 29.4  . Smokeless tobacco: Never  Used  . Tobacco comment: Quit x 20 years  Substance Use Topics  . Alcohol use: No    Alcohol/week: 0.0 standard drinks  . Drug use: No     Allergies   Ace inhibitors; Keflex [cephalexin]; Nitrofurantoin; and Penicillins   Review of Systems Review of Systems  All other systems reviewed and are negative.    Physical Exam Updated Vital Signs BP 107/63   Pulse (!) 59   Temp 98.4 F (36.9 C) (Oral)   Resp 17   Ht '5\' 6"'$  (1.676 m)   Wt 94.8 kg   SpO2 91%   BMI 33.73 kg/m   Physical Exam  Constitutional: She is oriented to person, place, and time. She appears well-developed and well-nourished. She appears distressed (She is uncomfortable).  Elderly, obese  HENT:  Head: Normocephalic and atraumatic.  Eyes: Pupils are equal, round, and reactive to light. Conjunctivae and EOM are normal.  Neck: Normal range of motion and phonation normal. Neck supple.  Cardiovascular: Normal rate and regular rhythm.  Pulmonary/Chest: Effort normal and breath sounds normal. She exhibits no tenderness.  Abdominal: Soft. She exhibits no distension. There is no tenderness. There is no guarding.  No fluid wave  Musculoskeletal: Normal range of motion.  Neurological: She is alert and oriented to person, place, and time. She exhibits normal muscle tone.  Skin: Skin is warm and dry.  Psychiatric: Her behavior is normal. Judgment and thought content normal.  She appears depressed  Nursing note and vitals reviewed.    ED Treatments / Results  Labs (all labs ordered are listed, but only abnormal results are displayed) Labs Reviewed  URINALYSIS, ROUTINE W REFLEX MICROSCOPIC - Abnormal; Notable for the following  components:      Result Value   APPearance HAZY (*)    Glucose, UA 50 (*)    Hgb urine dipstick MODERATE (*)    Protein, ur 30 (*)    Leukocytes, UA SMALL (*)    All other components within normal limits  URINE CULTURE    EKG EKG Interpretation  Date/Time:  Tuesday March 11 2018 14:21:44 EDT Ventricular Rate:  63 PR Interval:    QRS Duration: 85 QT Interval:  402 QTC Calculation: 412 R Axis:   33 Text Interpretation:  Sinus rhythm.  Low voltage, precordial leads Borderline T abnormalities, anterior leads Baseline wander in lead(s) V6 No STEMI.  Confirmed by Nanda Quinton [id number]) on 03/11/2018 2:24:06 PM   Radiology Dg Chest 2 View  Result Date: 03/11/2018 CLINICAL DATA:  Short of breath and fatigue for weeks. History of multiple myeloma. EXAM: CHEST - 2 VIEW COMPARISON:  02/17/2018 FINDINGS: Cardiac silhouette normal in size and configuration. No mediastinal or hilar masses. No evidence of adenopathy. Mild linear scarring at the right lung base. Lungs otherwise clear. No pleural effusion or pneumothorax. Skeletal structures are demineralized, but intact. IMPRESSION: No active cardiopulmonary disease. Electronically Signed   By: Lajean Manes M.D.   On: 03/11/2018 14:49    Procedures Procedures (including critical care time)  Medications Ordered in ED Medications  ciprofloxacin (CIPRO) tablet 500 mg (500 mg Oral Given 03/11/18 1842)     Initial Impression / Assessment and Plan / ED Course  I have reviewed the triage vital signs and the nursing notes.  Pertinent labs & imaging results that were available during my care of the patient were reviewed by me and considered in my medical decision making (see chart for details).  Clinical Course as of Mar 11 1937  Tue Mar 11, 2018  1555 No acute abnormalities, images reviewed by me  DG Chest 2 View [EW]  1732 Urine ordered at 1412, cath requested at 4 PM, urine still not obtained.   [EW]    Clinical Course User Index [EW] Daleen Bo, MD     Patient Vitals for the past 24 hrs:  BP Temp Temp src Pulse Resp SpO2 Height Weight  03/11/18 1900 107/63 - - (!) 59 17 91 % - -  03/11/18 1850 94/75 - - 62 - 93 % - -  03/11/18 1730 93/63 - - 62 20 100 % - -  03/11/18 1700 (!) 95/49 - - 61 17 100 % - -    03/11/18 1630 (!) 101/55 - - 60 (!) 21 98 % - -  03/11/18 1600 (!) 99/55 - - 60 17 93 % - -  03/11/18 1530 108/65 - - - (!) 28 - - -  03/11/18 1430 106/65 - - 66 16 95 % - -  03/11/18 1407 - - - - - - _0  (1.676 m) 94.8 kg  03/11/18 1406 95/78 98.4 F (36.9 C) Oral 64 20 96 % - -    At discharge- reevaluation with update and discussion. After initial assessment and treatment, an updated evaluation reveals she is comfortable has no further complaints.  Findings discussed and questions answered. Daleen Bo   Medical Decision Making: Lucky Rathke, with situational depression.  Unclear if patient has major depression.  Suicidal or homicidal ideation.  No indication for admission medically.  Screening labs indicate UTI.  Reviewed labs done earlier today in preparation for her follow-up with oncology next week.  Doubt sepsis or metabolic instability.  CRITICAL CARE-no  Performed by: Daleen Bo  Nursing Notes Reviewed/ Care Coordinated Applicable Imaging Reviewed Interpretation of Laboratory Data incorporated into ED treatment  The patient appears reasonably screened and/or stabilized for discharge and I doubt any other medical condition or other [facility] requiring further screening, evaluation, or treatment in the ED at this time prior to discharge.  Plan: Home Medications-continue usual medications; Home Treatments-rest, fluids; return here if the recommended treatment, does not improve the symptoms; Recommended follow up-follow-up with previously scheduled appointments as scheduled    Final Clinical Impressions(s) / ED Diagnoses   Final diagnoses:  Urinary tract infection without hematuria, site unspecified  [facility]    ED Discharge Orders         Ordered    ciprofloxacin (CIPRO) 500 MG tablet  2 times daily     03/11/18 1907           Daleen Bo, MD 03/11/18 1940

## 2018-03-11 NOTE — Telephone Encounter (Signed)
Pt is returning call to you--she is currently in the ED

## 2018-03-11 NOTE — Telephone Encounter (Signed)
Per Dr.Simpson, I called patient to go over her appts/referrals that were requested by Dr.Simpson and make sure she knows what day to be where/when.

## 2018-03-11 NOTE — ED Notes (Signed)
Diagnosed with multiple myeloma last year. Not currently on any cancer treatment. Due to see oncologist tomorrow. Was told results on recent chest xray " showed two spots on my lungs ".

## 2018-03-12 ENCOUNTER — Other Ambulatory Visit: Payer: Self-pay | Admitting: Cardiology

## 2018-03-12 LAB — PROTEIN ELECTROPHORESIS, SERUM
A/G Ratio: 1 (ref 0.7–1.7)
Albumin ELP: 4 g/dL (ref 2.9–4.4)
Alpha-1-Globulin: 0.2 g/dL (ref 0.0–0.4)
Alpha-2-Globulin: 0.9 g/dL (ref 0.4–1.0)
Beta Globulin: 1.1 g/dL (ref 0.7–1.3)
Gamma Globulin: 2 g/dL — ABNORMAL HIGH (ref 0.4–1.8)
Globulin, Total: 4.2 g/dL — ABNORMAL HIGH (ref 2.2–3.9)
M-Spike, %: 1.3 g/dL — ABNORMAL HIGH
Total Protein ELP: 8.2 g/dL (ref 6.0–8.5)

## 2018-03-12 LAB — KAPPA/LAMBDA LIGHT CHAINS
Kappa free light chain: 49.1 mg/L — ABNORMAL HIGH (ref 3.3–19.4)
Kappa, lambda light chain ratio: 2.92 — ABNORMAL HIGH (ref 0.26–1.65)
Lambda free light chains: 16.8 mg/L (ref 5.7–26.3)

## 2018-03-12 LAB — BETA 2 MICROGLOBULIN, SERUM: Beta-2 Microglobulin: 2.6 mg/L — ABNORMAL HIGH (ref 0.6–2.4)

## 2018-03-12 LAB — IMMUNOFIXATION ELECTROPHORESIS
IgA: 246 mg/dL (ref 64–422)
IgG (Immunoglobin G), Serum: 2098 mg/dL — ABNORMAL HIGH (ref 700–1600)
IgM (Immunoglobulin M), Srm: 141 mg/dL (ref 26–217)
Total Protein ELP: 8 g/dL (ref 6.0–8.5)

## 2018-03-12 LAB — IGG, IGA, IGM
IgA: 232 mg/dL (ref 64–422)
IgG (Immunoglobin G), Serum: 1932 mg/dL — ABNORMAL HIGH (ref 700–1600)
IgM (Immunoglobulin M), Srm: 136 mg/dL (ref 26–217)

## 2018-03-13 ENCOUNTER — Other Ambulatory Visit: Payer: Self-pay | Admitting: Family Medicine

## 2018-03-13 LAB — URINE CULTURE: Culture: 80000 — AB

## 2018-03-14 ENCOUNTER — Encounter: Payer: Self-pay | Admitting: Cardiology

## 2018-03-14 ENCOUNTER — Telehealth: Payer: Self-pay | Admitting: Emergency Medicine

## 2018-03-14 ENCOUNTER — Ambulatory Visit (INDEPENDENT_AMBULATORY_CARE_PROVIDER_SITE_OTHER): Payer: Medicare Other | Admitting: Cardiology

## 2018-03-14 VITALS — BP 162/78 | HR 93 | Ht 66.0 in | Wt 208.0 lb

## 2018-03-14 DIAGNOSIS — I10 Essential (primary) hypertension: Secondary | ICD-10-CM

## 2018-03-14 DIAGNOSIS — R0602 Shortness of breath: Secondary | ICD-10-CM | POA: Diagnosis not present

## 2018-03-14 NOTE — Progress Notes (Signed)
Clinical Summary Cheryl Abbott is a 72 y.o.female seen today for follow up of the following medical problems.  1. Chest pain/SOB - echo 02/2016 LVEF 60-65%, grade I diastoilc dysfunction - 12/2013 nuclear stress without ischemia, low risk. 07/2016 PFTs: mild restrictive disease   - SOB has increased along with generalized fatigue. Signifiacnt incease over the last 3 weeks - no recent cough, no wheezing. Has had some abdominal distension, no LE edema Can have some chest pain first think in morning but no exertional symptoms.  - recent abnormal CXR.  - some improvement with symbicort   2. History of TIA  On ASA/Clopidogrel per neurolgoy   3. History of multiple myeloma  4. Fatigue - see in ER 03/11/18 with generalized fatigue   5. SOB/DOE - recently started on symbicort by pcp  6. HTN - compliant with meds, controlled at other recent visits.   Past Medical History:  Diagnosis Date  . Anemia   . Anxiety   . Anxiety disorder   . Arthritis   . Cancer ([place])   . 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 Chronic back pain   . Depression   . Fatty liver   . GERD (gastroesophageal reflux disease)   . Headache   . History of adenomatous polyp of colon    2008  . History of kidney stones   . Hypercalcemia   . Hyperlipidemia   . Hypertension   . Left ureteral stone   . Lipoma of arm 01/19/2015  . Lumbar disc disease with radiculopathy   . Nephrolithiasis   . Neuropathy, peripheral   . OSA treated with BiPAP    per study 2007  . Sciatica   . Sigmoid diverticulosis   . Type 2 diabetes mellitus (HCC)      Allergies  Allergen Reactions  . Ace Inhibitors Cough  . Keflex [Cephalexin] Diarrhea and Nausea And Vomiting  . Nitrofurantoin Diarrhea and Nausea And Vomiting  . Penicillins Hives and Other (See Comments)    Reaction:  Blisters on hands/feet  Has patient had a PCN reaction causing immediate rash, facial/tongue/throat swelling, SOB or lightheadedness  with hypotension: No Has patient had a PCN reaction causing severe rash involving mucus membranes or skin necrosis: No Has patient had a PCN reaction that required hospitalization No Has patient had a PCN reaction occurring within the last 10 years: No If all of the above answers are "NO", then may proceed with Cephalosporin use.     Current Outpatient Medications  Medication Sig Dispense Refill  . ALPRAZolam (XANAX) 0.5 MG tablet One tablet once daily as needed, for anxiety (Patient taking differently: Take 0.5 mg by mouth daily as needed for anxiety. ) 30 tablet 5  . ALPRAZolam (XANAX) 0.5 MG tablet One tablet at bedtime as needed for uncontrolled anxiety (Patient not taking: Reported on 03/11/2018) 10 tablet 0  . amLODipine (NORVASC) 10 MG tablet TAKE 1 TABLET BY MOUTH EVERY DAY (Patient taking differently: Take 10 mg by mouth daily. ) 90 tablet 3  . aspirin EC 325 MG tablet Take 325 mg by mouth daily.    Marland Kitchen azithromycin (ZITHROMAX) 250 MG tablet Take two tablets on day one, then take one tablet once daily for an additional four days (Patient not taking: Reported on 03/11/2018) 6 tablet 0  . budesonide-formoterol (SYMBICORT) 80-4.5 MCG/ACT inhaler Inhale 2 puffs into the lungs 2 (two) times daily. 1 Inhaler 3  . ciprofloxacin (CIPRO) 500 MG tablet Take 1 tablet (500 mg total)  by mouth 2 (two) times daily. One po bid x 7 days 14 tablet 0  . diphenhydramine-acetaminophen (TYLENOL PM) 25-500 MG TABS tablet Take 2 tablets by mouth at bedtime.    . docusate sodium (COLACE) 100 MG capsule Take 200 mg by mouth at bedtime.     . iron polysaccharides (NIFEREX) 150 MG capsule Take 150 mg by mouth daily.    Marland Kitchen KLOR-CON M20 20 MEQ tablet TAKE 1 TABLET BY MOUTH EVERY DAY 90 tablet 0  . lovastatin (MEVACOR) 40 MG tablet TAKE 2 TABLETS (80 MG TOTAL) BY MOUTH AT BEDTIME. (Patient taking differently: Take 40 mg by mouth at bedtime. ) 180 tablet 1  . OXYGEN Inhale into the lungs daily. Bedtime    . pantoprazole  (PROTONIX) 40 MG tablet TAKE 1 TABLET (40 MG TOTAL) BY MOUTH 2 (TWO) TIMES DAILY. 180 tablet 1  . PARoxetine (PAXIL) 40 MG tablet TAKE 1 TABLET BY MOUTH EVERY DAY IN THE MORNING (Patient taking differently: Take 40 mg by mouth every morning. ) 90 tablet 0  . tiZANidine (ZANAFLEX) 2 MG tablet TAKE 1 TABLET BY MOUTH TWICE A DAY AS NEEDED FOR MUSCLE SPASM (Patient taking differently: Take 2 mg by mouth 2 (two) times daily as needed for muscle spasms. ) 30 tablet 1  . TRADJENTA 5 MG TABS tablet TAKE 1 TABLET BY MOUTH EVERY DAY (Patient taking differently: Take 5 mg by mouth daily. ) 90 tablet 1  . traZODone (DESYREL) 100 MG tablet Take 1 tablet (100 mg total) by mouth at bedtime. 30 tablet 4   No current facility-administered medications for this visit.      Past Surgical History:  Procedure Laterality Date  . BIOPSY N/A 03/17/2013   Procedure: GASTRIC BIOPSIES;  Surgeon: Danie Binder, MD;  Location: [facility];  Service: Endoscopy;  Laterality: N/A;  . BIOPSY  06/10/2015   Procedure: BIOPSY;  Surgeon: Danie Binder, MD;  [facility];  Service: Endoscopy;;  gastric biopsy  . CARDIAC CATHETERIZATION  05-11-2003  dr Shelva Majestic ([facility])   Abnormal cardiolite/   Normal coronary arteries and normal LVF,  ef  63%  . CARDIOVASCULAR STRESS TEST  01-01-2014   normal lexiscan cardiolite/  no ischemia/ infarct/  normal LV function and wall motion ,  ef 81%  . COLONOSCOPY  last one 10/02/2011   [facility] [place] TICS, IH, NEXT TCS APR 2018  . COLONOSCOPY WITH PROPOFOL N/A 02/26/2017   Procedure: COLONOSCOPY WITH PROPOFOL;  Surgeon: Danie Binder, MD;  [facility];  Service: Endoscopy;  Laterality: N/A;  11:00am  . CYSTO/   RIGHT URETEROSOCOPY LASER LITHOTRIPSY STONE EXTRACTION  10-13-2004  . CYSTO/  RIGHT RETROGRADE PYELOGRAM/  PLACMENT RIGHT URETERAL STENT  01-10-2010  . CYSTOSCOPY W/ URETERAL STENT PLACEMENT Right 08/19/2015   Procedure: CYSTOSCOPY WITH RETROGRADE  PYELOGRAM/URETERAL STENT PLACEMENT;  Surgeon: Franchot Gallo, MD;  Location: [facility];  Service: Urology;  Laterality: Right;  . CYSTOSCOPY WITH RETROGRADE PYELOGRAM, URETEROSCOPY AND STENT PLACEMENT Left 10/21/2014   Procedure: CYSTOSCOPY WITH RETROGRADE PYELOGRAM, URETEROSCOPY AND STENT PLACEMENT;  Surgeon: Franchot Gallo, MD;  Location: [facility];  Service: Urology;  Laterality: Left;  . CYSTOSCOPY WITH RETROGRADE PYELOGRAM, URETEROSCOPY AND STENT PLACEMENT Right 11/22/2015   Procedure: CYSTOSCOPY, RIGHT URETERAL STENT REMOVAL; RIGHT RETROGRADE PYELOGRAM, RIGHT URETEROSCOPY WITH BALLOON DILATION; RIGHT URETERAL STENT PLACEMENT;  Surgeon: Franchot Gallo, MD;  Location: [facility];  Service: Urology;  Laterality: Right;  . CYSTOSCOPY/RETROGRADE/URETEROSCOPY/STONE EXTRACTION WITH BASKET  Bilateral 08/02/2015   Procedure: CYSTOSCOPY; BILATERAL RETROGRADE PYELOGRAMS; BILATERAL URETEROSCOPY, STONE EXTRACTION WITH BASKET;  Surgeon: Franchot Gallo, MD;  Location: [facility];  Service: Urology;  Laterality: Bilateral;  . CYSTOSCOPY/RETROGRADE/URETEROSCOPY/STONE EXTRACTION WITH BASKET Right 06/28/2015   Procedure: CYSTOSCOPY/RETROGRADE/URETEROSCOPY/STONE EXTRACTION WITH BASKET, RIGHT URETERAL DOUBLE J STENT PLACEMENT;  Surgeon: Franchot Gallo, MD;  Location: [facility];  Service: Urology;  Laterality: Right;  . ESOPHAGOGASTRODUODENOSCOPY (EGD) WITH PROPOFOL N/A 03/17/2013   Procedure: ESOPHAGOGASTRODUODENOSCOPY (EGD) WITH PROPOFOL;  Surgeon: Danie Binder, MD;  [facility]: [facility];  Service: Endoscopy;  Laterality: N/A;  . ESOPHAGOGASTRODUODENOSCOPY (EGD) WITH PROPOFOL N/A 06/10/2015   Distal gastritis, distal esophageal stricture s/p dilation  . ESOPHAGOGASTRODUODENOSCOPY (EGD) WITH PROPOFOL N/A 02/26/2017   Procedure: ESOPHAGOGASTRODUODENOSCOPY (EGD) WITH PROPOFOL;  Surgeon: Danie Binder, MD;  [facility];  Service: Endoscopy;  Laterality: N/A;  . FLEXIBLE SIGMOIDOSCOPY  09/11/2011    JKD:[id number] Internal hemorrhoids  . HOLMIUM LASER APPLICATION Left 01/18/9832   Procedure: HOLMIUM LASER APPLICATION;  Surgeon: Franchot Gallo, MD;  Location: [facility];  Service: Urology;  Laterality: Left;  . HOLMIUM LASER APPLICATION Right 02/02/538   Procedure: HOLMIUM LASER APPLICATION;  Surgeon: Franchot Gallo, MD;  Location: [facility];  Service: Urology;  Laterality: Right;  . MASS EXCISION Left 03/04/2015   Procedure: EXCISION OF SOFT TISSUE NEOPLASM LEFT ARM;  Surgeon: Aviva Signs, MD;  Location: [facility];  Service: General;  Laterality: Left;  . PERCUTANEOUS NEPHROSTOLITHOTOMY Bilateral 12/  2011     [facility]  . POLYPECTOMY N/A 03/17/2013   Procedure: GASTRIC POLYPECTOMY;  Surgeon: Danie Binder, MD;  Location: [facility];  Service: Endoscopy;  Laterality: N/A;  . POLYPECTOMY  02/26/2017   Procedure: POLYPECTOMY;  Surgeon: Danie Binder, MD;  [facility];  Service: Endoscopy;;  polyp at cecum, ascending colon polyps x3, hepatic flexure polyps x8, transverse colon polyps x8   . REMOVAL RIGHT THIGH CYST  2006  . SAVORY DILATION N/A 03/17/2013   Procedure: SAVORY DILATION;  Surgeon: Danie Binder, MD;  Location: [facility];  Service: Endoscopy;  Laterality: N/A;  #12.8, 14, 15, 16 dilators used  . SAVORY DILATION N/A 06/10/2015   Procedure: SAVORY DILATION;  Surgeon: Danie Binder, MD;  [facility];  Service: Endoscopy;  Laterality: N/A;  . TRANSTHORACIC ECHOCARDIOGRAM  01-01-2014   mild LVH/  ef 76-73%/  grade I diastolic dysfunction/  trivial MR, TR, and PR  . VAGINAL HYSTERECTOMY  1970's     Allergies  Allergen Reactions  . Ace Inhibitors Cough  . Keflex [Cephalexin] Diarrhea and Nausea And Vomiting  . Nitrofurantoin Diarrhea and Nausea And Vomiting  . Penicillins Hives and Other (See Comments)    Reaction:  Blisters on hands/feet  Has patient had a PCN reaction causing immediate rash, facial/tongue/throat swelling, SOB or lightheadedness  with hypotension: No Has patient had a PCN reaction causing severe rash involving mucus membranes or skin necrosis: No Has patient had a PCN reaction that required hospitalization No Has patient had a PCN reaction occurring within the last 10 years: No If all of the above answers are "NO", then may proceed with Cephalosporin use.      Family History  Problem Relation Age of Onset  . Hypertension Mother   . Diabetes Mother   . Heart failure Mother   . Dementia Mother   . Emphysema Father   . Hypertension Father   . Diabetes Brother   . GER disease Brother   .  Hypertension Sister   . Hypertension Sister   . Cancer Unknown        family history   . Diabetes Unknown        family history   . Heart defect Unknown        famiily history   . Arthritis Unknown        family history   . Anesthesia problems Neg Hx   . Hypotension Neg Hx   . Malignant hyperthermia Neg Hx   . Pseudochol deficiency Neg Hx   . Colon cancer Neg Hx      Social History Cheryl Abbott reports that she quit smoking about 29 years ago. Her smoking use included cigarettes. She started smoking about 43 years ago. She has a 20.00 pack-year smoking history. She has never used smokeless tobacco. Cheryl Abbott reports that she does not drink alcohol.   Review of Systems CONSTITUTIONAL:per hpi  HEENT: Eyes: No visual loss, blurred vision, double vision or yellow sclerae.No hearing loss, sneezing, congestion, runny nose or sore throat.  SKIN: No rash or itching.  CARDIOVASCULAR: per hpi RESPIRATORY: per hpi  GASTROINTESTINAL: No anorexia, nausea, vomiting or diarrhea. No abdominal pain or blood.  GENITOURINARY: No burning on urination, no polyuria NEUROLOGICAL: No headache, dizziness, syncope, paralysis, ataxia, numbness or tingling in the extremities. No change in bowel or bladder control.  MUSCULOSKELETAL: No muscle, back pain, joint pain or stiffness.  LYMPHATICS: No enlarged nodes. No history of splenectomy.    PSYCHIATRIC: No history of depression or anxiety.  ENDOCRINOLOGIC: No reports of sweating, cold or heat intolerance. No polyuria or polydipsia.  Marland Kitchen   Physical Examination Vitals:   03/14/18 1339  BP: (!) 162/78  Pulse: 93  SpO2: 90%   Vitals:   03/14/18 1339  Weight: 208 lb (94.3 kg)  Height: _0  (1.676 m)    Gen: resting comfortably, no acute distress HEENT: no scleral icterus, pupils equal round and reactive, no palptable cervical adenopathy,  CV: RRR, no m/r/g, nojvd Resp: Clear to auscultation bilaterally GI: abdomen is soft, non-tender, non-distended, normal bowel sounds, no hepatosplenomegaly MSK: extremities are warm, no edema.  Skin: warm, no rash Neuro:  no focal deficits Psych: appropriate affect   Diagnostic Studies     Assessment and Plan  1. SOB and fatigue - long history of SOB, fatigue -previous unnremarkable cardiac and pulmonary workup a few years ago - recent increase in symptoms, we will repeat echo.   2. HTN - elevated today, however at goal at other recent appointments - continue ot monitor        Arnoldo Lenis, M.D

## 2018-03-14 NOTE — Patient Instructions (Signed)
Medication Instructions:  Your physician recommends that you continue on your current medications as directed. Please refer to the Current Medication list given to you today.   Labwork: NONE  Testing/Procedures: Your physician has requested that you have an echocardiogram. Echocardiography is a painless test that uses sound waves to create images of your heart. It provides your doctor with information about the size and shape of your heart and how well your heart's chambers and valves are working. This procedure takes approximately one hour. There are no restrictions for this procedure.    Follow-Up: Your physician recommends that you schedule a follow-up appointment in: PENDING TEST RESULT    Any Other Special Instructions Will Be Listed Below (If Applicable).     If you need a refill on your cardiac medications before your next appointment, please call your pharmacy.

## 2018-03-14 NOTE — Progress Notes (Signed)
ED Antimicrobial Stewardship Positive Culture Follow Up   Cheryl Abbott is an 72 y.o. female who presented to [facility] on 03/11/2018 with a chief complaint of  Chief Complaint  Patient presents with  . Weakness    Recent Results (from the past 720 hour(s))  Urine culture     Status: Abnormal   Collection Time: 03/11/18  2:12 PM  Result Value Ref Range Status   Specimen Description   Final    URINE, CLEAN CATCH Performed at [facility], [address], [place], [place] [address]    Special Requests   Final    NONE Performed at [facility], [address] [place], [place] [address]    Culture (A)  Final    80,000 COLONIES/mL DIPHTHEROIDS(CORYNEBACTERIUM SPECIES) Standardized susceptibility testing for this organism is not available. Performed at [facility], [place] [address] [place], [place] [address]    Report Status 03/13/2018 FINAL  Final    Patient discharged on ciprofloxacin 500 mg BID; treatment not necessary due to no urinary symptoms and growth of a common contaminant in urine culture. Plan: discontinue ciprofloxacin.   ED Provider: Shelby Dubin   Cleotis Lema 03/14/2018, 9:44 AM PharmD Candidate Monday - Friday phone -  [phone] Saturday - Sunday phone - [phone]

## 2018-03-14 NOTE — Telephone Encounter (Signed)
Post ED Visit - Positive Culture Follow-up  Culture report reviewed by antimicrobial stewardship pharmacist:  []  Elenor Quinones, Pharm.D. []  Heide Guile, Pharm.D., BCPS AQ-ID []  Parks Neptune, Pharm.D., BCPS []  Alycia Rossetti, Pharm.D., BCPS []  [place], Pharm.D., BCPS, AAHIVP []  Legrand Como, Pharm.D., BCPS, AAHIVP []  Salome Arnt, PharmD, BCPS []  Johnnette Gourd, PharmD, BCPS []  Hughes Better, PharmD, BCPS [x]  Cleotis Lema, PharmD  Positive Urine culture Discontinue Cipro No further treatment is required at this time.  Larene Beach France Noyce 03/14/2018, 10:18 AM

## 2018-03-15 ENCOUNTER — Encounter: Payer: Self-pay | Admitting: Cardiology

## 2018-03-17 ENCOUNTER — Other Ambulatory Visit: Payer: Self-pay | Admitting: Family Medicine

## 2018-03-17 DIAGNOSIS — Z1231 Encounter for screening mammogram for malignant neoplasm of breast: Secondary | ICD-10-CM

## 2018-03-18 ENCOUNTER — Inpatient Hospital Stay (HOSPITAL_COMMUNITY): Payer: Medicare Other | Attending: Hematology | Admitting: Internal Medicine

## 2018-03-18 ENCOUNTER — Encounter (HOSPITAL_COMMUNITY): Payer: Self-pay | Admitting: Internal Medicine

## 2018-03-18 ENCOUNTER — Ambulatory Visit (HOSPITAL_COMMUNITY)
Admission: RE | Admit: 2018-03-18 | Discharge: 2018-03-18 | Disposition: A | Discharge status: Home / Self Care | Payer: Medicare Other | Source: Ambulatory Visit | Attending: Cardiology | Admitting: Cardiology

## 2018-03-18 ENCOUNTER — Ambulatory Visit (HOSPITAL_COMMUNITY)
Admission: RE | Admit: 2018-03-18 | Discharge: 2018-03-18 | Disposition: A | Discharge status: Home / Self Care | Payer: Medicare Other | Source: Ambulatory Visit | Attending: Internal Medicine | Admitting: Internal Medicine

## 2018-03-18 ENCOUNTER — Other Ambulatory Visit: Payer: Self-pay

## 2018-03-18 VITALS — BP 114/81 | HR 78 | Temp 98.5°F | Resp 18 | Wt 208.2 lb

## 2018-03-18 DIAGNOSIS — D890 Polyclonal hypergammaglobulinemia: Secondary | ICD-10-CM

## 2018-03-18 DIAGNOSIS — Z87442 Personal history of urinary calculi: Secondary | ICD-10-CM

## 2018-03-18 DIAGNOSIS — D509 Iron deficiency anemia, unspecified: Secondary | ICD-10-CM

## 2018-03-18 DIAGNOSIS — F329 Major depressive disorder, single episode, unspecified: Secondary | ICD-10-CM

## 2018-03-18 DIAGNOSIS — E785 Hyperlipidemia, unspecified: Secondary | ICD-10-CM | POA: Diagnosis not present

## 2018-03-18 DIAGNOSIS — N183 Chronic kidney disease, stage 3 (moderate): Secondary | ICD-10-CM | POA: Insufficient documentation

## 2018-03-18 DIAGNOSIS — G4730 Sleep apnea, unspecified: Secondary | ICD-10-CM | POA: Insufficient documentation

## 2018-03-18 DIAGNOSIS — R0602 Shortness of breath: Secondary | ICD-10-CM

## 2018-03-18 DIAGNOSIS — I10 Essential (primary) hypertension: Secondary | ICD-10-CM | POA: Diagnosis not present

## 2018-03-18 DIAGNOSIS — D472 Monoclonal gammopathy: Secondary | ICD-10-CM

## 2018-03-18 DIAGNOSIS — C9000 Multiple myeloma not having achieved remission: Secondary | ICD-10-CM | POA: Insufficient documentation

## 2018-03-18 DIAGNOSIS — E1122 Type 2 diabetes mellitus with diabetic chronic kidney disease: Secondary | ICD-10-CM

## 2018-03-18 DIAGNOSIS — I129 Hypertensive chronic kidney disease with stage 1 through stage 4 chronic kidney disease, or unspecified chronic kidney disease: Secondary | ICD-10-CM | POA: Diagnosis not present

## 2018-03-18 DIAGNOSIS — E119 Type 2 diabetes mellitus without complications: Secondary | ICD-10-CM | POA: Diagnosis not present

## 2018-03-18 DIAGNOSIS — Z8673 Personal history of transient ischemic attack (TIA), and cerebral infarction without residual deficits: Secondary | ICD-10-CM | POA: Diagnosis not present

## 2018-03-18 LAB — ECHOCARDIOGRAM COMPLETE: Weight: 3331.2 oz

## 2018-03-18 NOTE — Progress Notes (Signed)
Diagnosis Monoclonal (M) protein disease, multiple 'M' protein - Plan: CBC with Differential/Platelet, Comprehensive metabolic panel, Lactate dehydrogenase, Protein electrophoresis, serum, IgG, IgA, IgM, Kappa/lambda light chains  Monoclonal paraproteinemia - Plan: DG Bone Survey Met  Staging Cancer Staging No matching staging information was found for the patient.  Assessment and Plan:  1.  Smoldering MM.  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  Previously, I discussed with pt and family review of previous records and last visit with Dr. Talbert Cage in 04/2017 and per the record, pt was not recommended to begin therapy at that time.  Dr Talbert Cage discussed with them  results of bone marrow biopsy that was done on 02/19/2017 and showed:  -Bone marrow biopsy demonstrated 14% plasma cells with normal cytogenetics. -Bone survey was done on 02/15/2017 was   negative for lytic lesions.  Labs done 10/30/2017 reviewed with pt and shwed WBC 8.9 HB 13.7 and plts 311,000.  Chemistries shwed Cr 1.84, Calcium 9.7 and normal LFTs.  SPEP showed M spike 1.5 g/dl that was stable with level of 1.4 g/dl on SPEP that was done 08/02/2017.  K/L ratio 3.33.    At that time in 04/2017 and presently she shows no clear evidence of CRAB (hypercalcemia, renal failure with creatinine >2, anemia <10 g/dL, bone lytic lesions) criteria for symptomatic treatment requiring starting treatment.  She will need to continue to have close monitoring with labs.    Pt continues to question does she have cancer.  I discussed with her that at previous visit with her family and due to concerns about not being on  therapy, she was referred to Dr. Norma Fredrickson of [facility] for second opinion evaluation and to determine if she may be a candidate for any trials. Pt did not go to that appointment.    Labs done 03/11/2018 reviewed with pt and showed WBC 8 HB 14 plts 254,000.  Chemistries WNL with K+ 3.6 Cr 1.35  and normal Calcium and LFTs.  SPEP showed stable m spike measuring 1.3 g/dl.  K/L ratio is 2.92.    Dr. Talbert Cage had previously discussed induction treatment with revlimid, velcade, and decadron in the future should she progress but had not indicated starting therapy at the present time.    Pt will undergo skeletal survey today as her last imaging was in 02/2017.  She will be notified of results.  Pt will RTC in 07/2018 for follow-up and repeat labs.    2.  Iron deficiency anemia.  Pt was last treated with  IV iron in 11/2017.  Labs done 03/11/2018 reviewed and showed HB 14, Ferritin of 149.  Pt will have repeat labs on RTC in 07/2018.    3.  RI.  Cr 1.35.  She is followed by nephrology.  Pt has DM and a history of kidney stones.  Will repeat chemistries on RTC.    4.  HTN.  BP is 114/81.  Follow-up with PCP.    5.  DM.  Continue to follow-up with PCP.    6.  Depression/Fatigue.  Follow-up with PCP or behavioral health.  HB 14.    7.  Health maintenance.  Gi and mammogram screenings as recommended.   8. Cancer concerns.  I have discussed with her that findings show an abnormal protein that is being monitored with labs and x-rays.    Greater than 25 minutes spent with more than 50% spent in counseling and coordination of care.    Interval history: Historical  data obtained from the note dated 11/08/2017.  72 yr old  Female previously followed by Dr. Talbert Cage.   She was followed by her nephrologist for continued follow-up of her CKD and had labwork performed on 12/07/16. 24 hour urine IFE which demonstrated Bence-Jones protein positive; kappa type. Immunofixation showed IgG monoclonal proteins with kappa light chain specificity. Urine M spike was 8.3 mg per 24 hours. Urine light chain excess 670 mg/l. Serum immunofixation also showed IgG monoclonal protein with light chain specificity. SPEP demonstrated M spike of 1.5 g/dL. Free kappa light chains 36.8, free lambda light chains 18.5, free kappa/lambda light  chain ratio 1.99. Creatinine 1.6. Calcium 9.8. Patient states she feels fatigued. She has been having abdominal bloating for the past one week also. She denies any chest pain or shortness of breath. She denies any nausea or vomiting. She denies any bone pain, however she does have bilateral ankle pains. Skeletal survey on 02/15/2017 was negative for lytic lesions. Bone marrow biopsy on 02/19/2017 demonstrated bone marrow with trilineage hematopoiesis and plasma cells representing 14% of all cells with occasional atypical forms. Immunohistochemical stains were performed and highlighted the plasma cell component in relatively limited bone marrow areas which appear to show kappa light chain excess. Cytogenetics revealed normal female karyotype with no observable clonal chromosomal abnormalities.    She was treated with  IV iron in the past.    Current Status:  Pt is seen today for follow-up.  She is concerned if she has cancer.    PATHOLOGY: Diagnosis Bone Marrow, Aspirate,Biopsy, and Clot, left iliac crest - BONE MARROW WITH TRILINEAGE HEMATOPOIESIS AND PLASMACYTOSIS. - SEE COMMENT. PERIPHERAL BLOOD: - MILD NORMOCYTIC-NORMOCHROMIC ANEMIA. Diagnosis Note The clot and biopsy sections are suboptimal for evaluation. The aspirate shows trilineage hematopoiesis with non-specific changes, but there is increased number of plasma cells representing 14% of all cells with occasional atypical forms. Immunohistochemical stains were performed and highlight the plasma cell component in relatively limited bone marrow areas which appear to show kappa light chain excess. In the presence of a monoclonal protein with kappa light specificity, the limited findings favor plasma cell neoplasm. Correlation with cytogenetic and FISH studies are recommended. (BNS:ah/gt 02/20/17) Susanne Greenhouse MD Pathologist, Electronic RADIOGRAPHIC STUDIES: I have personally reviewed the radiological images as listed and agree with the  findings in the report  Problem List Patient Active Problem List   Diagnosis Date Noted  . Shoulder pain, left [M25.512] 02/28/2018  . Palpable mass of neck [R22.1] 02/28/2018  . Exertional dyspnea [R06.09] 02/28/2018  . Insomnia [G47.00] 11/09/2017  . Generalized pain [R52] 11/09/2017  . Multiple myeloma ([place]) [C90.00] 03/15/2017  . Urinary frequency [R35.0] 02/28/2017  . SIRS (systemic inflammatory response syndrome) ([place]) [R65.10] 02/26/2017  . Monoclonal paraproteinemia [D47.2] 02/15/2017  . Morbid obesity ([place]) [E66.01] 10/14/2016  . Vaginal dryness, menopausal [N95.1] 10/14/2016  . Neuropathic pain of both feet [G57.93] 04/11/2016  . TIA (transient ischemic attack) [G45.9] 02/26/2016  . Lactose intolerance in adult [E73.9] 02/01/2016  . Brain TIA [G45.9] 08/19/2015  . CKD (chronic kidney disease) stage 3, GFR 30-59 ml/min (HCC) [N18.3] 07/12/2015  . Lipoma of arm [D17.20] 01/19/2015  . Anemia [D64.9] 10/14/2014  . Lumbar back pain with radiculopathy affecting left lower extremity [M54.16] 05/11/2014  . Nocturnal hypoxia [G47.34] 02/04/2014  . Generalized anxiety disorder [F41.1] 05/20/2013  . Anxiety and depression [F41.9, F32.9] 08/13/2012  . Abnormal brain scan [R94.02] 02/07/2012  . Thoracic or lumbosacral neuritis or radiculitis, unspecified [IMO0002] 12/22/2010  . Hypercalcemia [E83.52]  11/30/2010  . Colon adenomas [D12.6] 05/11/2009  . HEMORRHOIDS [K64.9] 03/15/2009  . GERD [K21.9] 03/15/2009  . FATTY LIVER DISEASE [K76.89] 03/15/2009  . RENAL CALCULUS [N20.0] 03/15/2009  . Sleep apnea [G47.30] 09/15/2008  . 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 Hyperlipidemia LDL goal <100 [E78.5] 09/17/2007  . Hypertension goal BP (blood pressure) < 130/80 [I10] 09/17/2007    Past Medical History Past Medical History:  Diagnosis Date  . Anemia   . Anxiety   . Anxiety disorder   . Arthritis   . Cancer ([place])   . 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 Chronic back pain   . Depression   . Fatty liver   . GERD (gastroesophageal reflux disease)   . Headache   . History of adenomatous polyp of colon    2008  . History of kidney stones   . Hypercalcemia   . Hyperlipidemia   . Hypertension   . Left ureteral stone   . Lipoma of arm 01/19/2015  . Lumbar disc disease with radiculopathy   . Nephrolithiasis   . Neuropathy, peripheral   . OSA treated with BiPAP    per study 2007  . Sciatica   . Sigmoid diverticulosis   . Type 2 diabetes mellitus [facility])     Past Surgical History Past Surgical History:  Procedure Laterality Date  . BIOPSY N/A 03/17/2013   Procedure: GASTRIC BIOPSIES;  Surgeon: Danie Binder, MD;  Location: [facility];  Service: Endoscopy;  Laterality: N/A;  . BIOPSY  06/10/2015   Procedure: BIOPSY;  Surgeon: Danie Binder, MD;  [facility];  Service: Endoscopy;;  gastric biopsy  . CARDIAC CATHETERIZATION  05-11-2003  dr Shelva Majestic ([facility])   Abnormal cardiolite/   Normal coronary arteries and normal LVF,  ef  63%  . CARDIOVASCULAR STRESS TEST  01-01-2014   normal lexiscan cardiolite/  no ischemia/ infarct/  normal LV function and wall motion ,  ef 81%  . COLONOSCOPY  last one 10/02/2011   MOD [facility], IH, NEXT TCS APR 2018  . COLONOSCOPY WITH PROPOFOL N/A 02/26/2017   Procedure: COLONOSCOPY WITH PROPOFOL;  Surgeon: Danie Binder, MD;  [facility];  Service: Endoscopy;  Laterality: N/A;  11:00am  . CYSTO/   RIGHT URETEROSOCOPY LASER LITHOTRIPSY STONE EXTRACTION  10-13-2004  . CYSTO/  RIGHT RETROGRADE PYELOGRAM/  PLACMENT RIGHT URETERAL STENT  01-10-2010  . CYSTOSCOPY W/ URETERAL STENT PLACEMENT Right 08/19/2015   Procedure: CYSTOSCOPY WITH RETROGRADE PYELOGRAM/URETERAL STENT PLACEMENT;  Surgeon: Franchot Gallo, MD;  Location: [facility];  Service: Urology;  Laterality: Right;  . CYSTOSCOPY WITH RETROGRADE PYELOGRAM, URETEROSCOPY AND STENT PLACEMENT Left 10/21/2014    Procedure: CYSTOSCOPY WITH RETROGRADE PYELOGRAM, URETEROSCOPY AND STENT PLACEMENT;  Surgeon: Franchot Gallo, MD;  Location: [facility];  Service: Urology;  Laterality: Left;  . CYSTOSCOPY WITH RETROGRADE PYELOGRAM, URETEROSCOPY AND STENT PLACEMENT Right 11/22/2015   Procedure: CYSTOSCOPY, RIGHT URETERAL STENT REMOVAL; RIGHT RETROGRADE PYELOGRAM, RIGHT URETEROSCOPY WITH BALLOON DILATION; RIGHT URETERAL STENT PLACEMENT;  Surgeon: Franchot Gallo, MD;  Location: [facility];  Service: Urology;  Laterality: Right;  . CYSTOSCOPY/RETROGRADE/URETEROSCOPY/STONE EXTRACTION WITH BASKET Bilateral 08/02/2015   Procedure: CYSTOSCOPY; BILATERAL RETROGRADE PYELOGRAMS; BILATERAL URETEROSCOPY, STONE EXTRACTION WITH BASKET;  Surgeon: Franchot Gallo, MD;  Location: [facility];  Service: Urology;  Laterality: Bilateral;  . CYSTOSCOPY/RETROGRADE/URETEROSCOPY/STONE EXTRACTION WITH BASKET Right 06/28/2015   Procedure: CYSTOSCOPY/RETROGRADE/URETEROSCOPY/STONE EXTRACTION WITH BASKET, RIGHT URETERAL DOUBLE J STENT PLACEMENT;  Surgeon: Franchot Gallo,  MD;  Location: [facility];  Service: Urology;  Laterality: Right;  . ESOPHAGOGASTRODUODENOSCOPY (EGD) WITH PROPOFOL N/A 03/17/2013   Procedure: ESOPHAGOGASTRODUODENOSCOPY (EGD) WITH PROPOFOL;  Surgeon: Danie Binder, MD;  [facility];  Service: Endoscopy;  Laterality: N/A;  . ESOPHAGOGASTRODUODENOSCOPY (EGD) WITH PROPOFOL N/A 06/10/2015   Distal gastritis, distal esophageal stricture s/p dilation  . ESOPHAGOGASTRODUODENOSCOPY (EGD) WITH PROPOFOL N/A 02/26/2017   Procedure: ESOPHAGOGASTRODUODENOSCOPY (EGD) WITH PROPOFOL;  Surgeon: Danie Binder, MD;  [facility];  Service: Endoscopy;  Laterality: N/A;  . FLEXIBLE SIGMOIDOSCOPY  09/11/2011   KPT:[id number] Internal hemorrhoids  . HOLMIUM LASER APPLICATION Left 10/26/15   Procedure: HOLMIUM LASER APPLICATION;  Surgeon: Franchot Gallo, MD;  [facility];  Service: Urology;   Laterality: Left;  . HOLMIUM LASER APPLICATION Right 4/94/4967   Procedure: HOLMIUM LASER APPLICATION;  Surgeon: Franchot Gallo, MD;  Location[facility];  Service: Urology;  Laterality: Right;  . MASS EXCISION Left 03/04/2015   Procedure: EXCISION OF SOFT TISSUE NEOPLASM LEFT ARM;  Surgeon: Aviva Signs, MD;  Location: [facility];  Service: General;  Laterality: Left;  . PERCUTANEOUS NEPHROSTOLITHOTOMY Bilateral 12/  2011     [facility]  . POLYPECTOMY N/A 03/17/2013   Procedure: GASTRIC POLYPECTOMY;  Surgeon: Danie Binder, MD;  [facility];  Service: Endoscopy;  Laterality: N/A;  . POLYPECTOMY  02/26/2017   Procedure: POLYPECTOMY;  Surgeon: Danie Binder, MD;  [facility];  Service: Endoscopy;;  polyp at cecum, ascending colon polyps x3, hepatic flexure polyps x8, transverse colon polyps x8   . REMOVAL RIGHT THIGH CYST  2006  . SAVORY DILATION N/A 03/17/2013   Procedure: SAVORY DILATION;  Surgeon: Danie Binder, MD;  Location: [facility];  Service: Endoscopy;  Laterality: N/A;  #12.8, 14, 15, 16 dilators used  . SAVORY DILATION N/A 06/10/2015   Procedure: SAVORY DILATION;  Surgeon: Danie Binder, MD;  [facility];  Service: Endoscopy;  Laterality: N/A;  . TRANSTHORACIC ECHOCARDIOGRAM  01-01-2014   mild LVH/  ef 59-16%/  grade I diastolic dysfunction/  trivial MR, TR, and PR  . VAGINAL HYSTERECTOMY  1970's    Family History Family History  Problem Relation Age of Onset  . Hypertension Mother   . Diabetes Mother   . Heart failure Mother   . Dementia Mother   . Emphysema Father   . Hypertension Father   . Diabetes Brother   . GER disease Brother   . Hypertension Sister   . Hypertension Sister   . Cancer Unknown        family history   . Diabetes Unknown        family history   . Heart defect Unknown        famiily history   . Arthritis Unknown        family history   . Anesthesia problems Neg Hx   . Hypotension Neg Hx   . Malignant hyperthermia Neg Hx    . Pseudochol deficiency Neg Hx   . Colon cancer Neg Hx      Social History  reports that she quit smoking about 29 years ago. Her smoking use included cigarettes. She started smoking about 43 years ago. She has a 20.00 pack-year smoking history. She has never used smokeless tobacco. She reports that she does not drink alcohol or use drugs.  Medications  Current Outpatient Medications:  .  aspirin EC 325 MG tablet, Take 325 mg by mouth daily.,  Disp: , Rfl:  .  budesonide-formoterol (SYMBICORT) 80-4.5 MCG/ACT inhaler, Inhale 2 puffs into the lungs 2 (two) times daily., Disp: 1 Inhaler, Rfl: 3 .  diphenhydramine-acetaminophen (TYLENOL PM) 25-500 MG TABS tablet, Take 2 tablets by mouth at bedtime., Disp: , Rfl:  .  docusate sodium (COLACE) 100 MG capsule, Take 200 mg by mouth at bedtime. , Disp: , Rfl:  .  iron polysaccharides (NIFEREX) 150 MG capsule, Take 150 mg by mouth daily., Disp: , Rfl:  .  KLOR-CON M20 20 MEQ tablet, TAKE 1 TABLET BY MOUTH EVERY DAY, Disp: 90 tablet, Rfl: 0 .  lovastatin (MEVACOR) 40 MG tablet, TAKE 2 TABLETS (80 MG TOTAL) BY MOUTH AT BEDTIME. (Patient taking differently: Take 40 mg by mouth at bedtime. ), Disp: 180 tablet, Rfl: 1 .  OXYGEN, Inhale into the lungs daily. Bedtime, Disp: , Rfl:  .  pantoprazole (PROTONIX) 40 MG tablet, TAKE 1 TABLET (40 MG TOTAL) BY MOUTH 2 (TWO) TIMES DAILY., Disp: 180 tablet, Rfl: 1 .  PARoxetine (PAXIL) 40 MG tablet, TAKE 1 TABLET BY MOUTH EVERY DAY IN THE MORNING (Patient taking differently: Take 40 mg by mouth every morning. ), Disp: 90 tablet, Rfl: 0 .  tiZANidine (ZANAFLEX) 2 MG tablet, TAKE 1 TABLET BY MOUTH TWICE A DAY AS NEEDED FOR MUSCLE SPASM (Patient taking differently: Take 2 mg by mouth 2 (two) times daily as needed for muscle spasms. ), Disp: 30 tablet, Rfl: 1 .  TRADJENTA 5 MG TABS tablet, TAKE 1 TABLET BY MOUTH EVERY DAY (Patient taking differently: Take 5 mg by mouth daily. ), Disp: 90 tablet, Rfl: 1 .  traZODone (DESYREL)  100 MG tablet, TAKE 1 TABLET BY MOUTH EVERYDAY AT BEDTIME, Disp: 90 tablet, Rfl: 2 .  amLODipine (NORVASC) 10 MG tablet, TAKE 1 TABLET BY MOUTH EVERY DAY (Patient taking differently: Take 10 mg by mouth daily. ), Disp: 90 tablet, Rfl: 3 .  ciprofloxacin (CIPRO) 500 MG tablet, Take 1 tablet (500 mg total) by mouth 2 (two) times daily. One po bid x 7 days (Patient not taking: Reported on 03/18/2018), Disp: 14 tablet, Rfl: 0  Allergies Ace inhibitors; Keflex [cephalexin]; Nitrofurantoin; and Penicillins  Review of Systems Review of Systems - Oncology ROS negative other than fatigue   Physical Exam  Vitals Wt Readings from Last 3 Encounters:  03/18/18 208 lb 3.2 oz (94.4 kg)  03/14/18 208 lb (94.3 kg)  03/11/18 209 lb (94.8 kg)   Temp Readings from Last 3 Encounters:  03/18/18 98.5 F (36.9 C) (Oral)  03/11/18 98.4 F (36.9 C) (Oral)  11/29/17 97.9 F (36.6 C) (Oral)   BP Readings from Last 3 Encounters:  03/18/18 114/81  03/14/18 (!) 162/78  03/11/18 107/63   Pulse Readings from Last 3 Encounters:  03/18/18 78  03/14/18 93  03/11/18 (!) 59   Constitutional: Well-developed, well-nourished, and in no distress.  Seated in wheel chair.  HENT: Head: Normocephalic and atraumatic.  Mouth/Throat: No oropharyngeal exudate. Mucosa moist. Eyes: Pupils are equal, round, and reactive to light. Conjunctivae are normal. No scleral icterus.  Neck: Normal range of motion. Neck supple. No JVD present.  Cardiovascular: Normal rate, regular rhythm and normal heart sounds.  Exam reveals no gallop and no friction rub.   No murmur heard. Pulmonary/Chest: Effort normal and breath sounds normal. No respiratory distress. No wheezes.No rales.  Abdominal: Soft. Bowel sounds are normal. No distension. There is no tenderness. There is no guarding.  Musculoskeletal: No edema or tenderness.  Lymphadenopathy: No  cervical, axillary or supraclavicular adenopathy.  Neurological: Alert and oriented to  person, place, and time. No cranial nerve deficit.  Skin: Skin is warm and dry. No rash noted. No erythema. No pallor.  Psychiatric: Affect and judgment normal.   [facility] Outpatient Visit on 03/18/2018  Component Date Value Ref Range Status  . Weight 03/18/2018 3,331.2  oz In process  . BP 03/18/2018 114/81  mmHg In process     Pathology Orders Placed This Encounter  Procedures  . DG Bone Survey Met    Standing Status:   Future    Number of Occurrences:   1    Standing Expiration Date:   05/19/2019    Order Specific Question:   Reason for Exam (SYMPTOM  OR DIAGNOSIS REQUIRED)    Answer:   monoclonal gammopathy    Order Specific Question:   Preferred imaging location?    Answer:   [facility]    Order Specific Question:   Radiology Contrast Protocol - do NOT remove file path    Answer:   [email or web address]  . CBC with Differential/Platelet    Standing Status:   Future    Standing Expiration Date:   03/18/2020  . Comprehensive metabolic panel    Standing Status:   Future    Standing Expiration Date:   03/18/2020  . Lactate dehydrogenase    Standing Status:   Future    Standing Expiration Date:   03/18/2020  . Protein electrophoresis, serum    Standing Status:   Future    Standing Expiration Date:   03/18/2020  . IgG, IgA, IgM    Standing Status:   Future    Standing Expiration Date:   03/18/2020  . Kappa/lambda light chains    Standing Status:   Future    Standing Expiration Date:   03/18/2020       Zoila Shutter MD

## 2018-03-18 NOTE — Progress Notes (Signed)
*  PRELIMINARY RESULTS* Echocardiogram 2D Echocardiogram has been performed.  Cheryl Abbott 03/18/2018, 4:06 PM

## 2018-03-20 ENCOUNTER — Encounter: Payer: Self-pay | Admitting: General Surgery

## 2018-03-20 ENCOUNTER — Ambulatory Visit (INDEPENDENT_AMBULATORY_CARE_PROVIDER_SITE_OTHER): Payer: Medicare Other | Admitting: General Surgery

## 2018-03-20 VITALS — BP 122/69 | HR 89 | Temp 96.9°F | Resp 20 | Wt 209.0 lb

## 2018-03-20 DIAGNOSIS — R221 Localized swelling, mass and lump, neck: Secondary | ICD-10-CM

## 2018-03-20 NOTE — Progress Notes (Signed)
Cheryl Abbott; [id number]; 11-29-1945   HPI Patient is a 72 year old black female who was referred to my care for evaluation treatment of a swelling along the posterior aspect of her neck.  Patient states that has been present for some time.  She thinks it may have been more swollen over the past few months.  No drainage has been noted.  Is currently nontender.  She currently has a pain level 3 out of 10.  She has multiple medical problems.  I removed a lipoma from her left arm over 3 years ago. Past Medical History:  Diagnosis Date  . Anemia   . Anxiety   . Anxiety disorder   . Arthritis   . Cancer ([place])   . 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 Chronic back pain   . Depression   . Fatty liver   . GERD (gastroesophageal reflux disease)   . Headache   . History of adenomatous polyp of colon    2008  . History of kidney stones   . Hypercalcemia   . Hyperlipidemia   . Hypertension   . Left ureteral stone   . Lipoma of arm 01/19/2015  . Lumbar disc disease with radiculopathy   . Nephrolithiasis   . Neuropathy, peripheral   . OSA treated with BiPAP    per study 2007  . Sciatica   . Sigmoid diverticulosis   . Type 2 diabetes mellitus ([place])     Past Surgical History:  Procedure Laterality Date  . BIOPSY N/A 03/17/2013   Procedure: GASTRIC BIOPSIES;  Surgeon: Danie Binder, MD;  Location: [facility];  Service: Endoscopy;  Laterality: N/A;  . BIOPSY  06/10/2015   Procedure: BIOPSY;  Surgeon: Danie Binder, MD;  [facility];  Service: Endoscopy;;  gastric biopsy  . CARDIAC CATHETERIZATION  05-11-2003  dr Shelva Majestic ([facility])   Abnormal cardiolite/   Normal coronary arteries and normal LVF,  ef  63%  . CARDIOVASCULAR STRESS TEST  01-01-2014   normal lexiscan cardiolite/  no ischemia/ infarct/  normal LV function and wall motion ,  ef 81%  . COLONOSCOPY  last one 10/02/2011   [facility] TICS, IH, NEXT TCS APR 2018  . COLONOSCOPY WITH PROPOFOL N/A  02/26/2017   Procedure: COLONOSCOPY WITH PROPOFOL;  Surgeon: Danie Binder, MD;  [facility];  Service: Endoscopy;  Laterality: N/A;  11:00am  . CYSTO/   RIGHT URETEROSOCOPY LASER LITHOTRIPSY STONE EXTRACTION  10-13-2004  . CYSTO/  RIGHT RETROGRADE PYELOGRAM/  PLACMENT RIGHT URETERAL STENT  01-10-2010  . CYSTOSCOPY W/ URETERAL STENT PLACEMENT Right 08/19/2015   Procedure: CYSTOSCOPY WITH RETROGRADE PYELOGRAM/URETERAL STENT PLACEMENT;  Surgeon: Franchot Gallo, MD;  Location: [facility];  Service: Urology;  Laterality: Right;  . CYSTOSCOPY WITH RETROGRADE PYELOGRAM, URETEROSCOPY AND STENT PLACEMENT Left 10/21/2014   Procedure: CYSTOSCOPY WITH RETROGRADE PYELOGRAM, URETEROSCOPY AND STENT PLACEMENT;  Surgeon: Franchot Gallo, MD;  Location: [facility];  Service: Urology;  Laterality: Left;  . CYSTOSCOPY WITH RETROGRADE PYELOGRAM, URETEROSCOPY AND STENT PLACEMENT Right 11/22/2015   Procedure: CYSTOSCOPY, RIGHT URETERAL STENT REMOVAL; RIGHT RETROGRADE PYELOGRAM, RIGHT URETEROSCOPY WITH BALLOON DILATION; RIGHT URETERAL STENT PLACEMENT;  Surgeon: Franchot Gallo, MD;  Location: [facility];  Service: Urology;  Laterality: Right;  . CYSTOSCOPY/RETROGRADE/URETEROSCOPY/STONE EXTRACTION WITH BASKET Bilateral 08/02/2015   Procedure: CYSTOSCOPY; BILATERAL RETROGRADE PYELOGRAMS; BILATERAL URETEROSCOPY, STONE EXTRACTION WITH BASKET;  Surgeon: Franchot Gallo, MD;  Location: [facility];  Service: Urology;  Laterality: Bilateral;  . CYSTOSCOPY/RETROGRADE/URETEROSCOPY/STONE  EXTRACTION WITH BASKET Right 06/28/2015   Procedure: CYSTOSCOPY/RETROGRADE/URETEROSCOPY/STONE EXTRACTION WITH BASKET, RIGHT URETERAL DOUBLE J STENT PLACEMENT;  Surgeon: Franchot Gallo, MD;  Location: [facility];  Service: Urology;  Laterality: Right;  . ESOPHAGOGASTRODUODENOSCOPY (EGD) WITH PROPOFOL N/A 03/17/2013   Procedure: ESOPHAGOGASTRODUODENOSCOPY (EGD) WITH PROPOFOL;  Surgeon: Danie Binder, MD;  Location: [facility];  Service:  Endoscopy;  Laterality: N/A;  . ESOPHAGOGASTRODUODENOSCOPY (EGD) WITH PROPOFOL N/A 06/10/2015   Distal gastritis, distal esophageal stricture s/p dilation  . ESOPHAGOGASTRODUODENOSCOPY (EGD) WITH PROPOFOL N/A 02/26/2017   Procedure: ESOPHAGOGASTRODUODENOSCOPY (EGD) WITH PROPOFOL;  Surgeon: Danie Binder, MD;  [facility];  Service: Endoscopy;  Laterality: N/A;  . FLEXIBLE SIGMOIDOSCOPY  09/11/2011   [id number] Internal hemorrhoids  . HOLMIUM LASER APPLICATION Left 4/81/8563   Procedure: HOLMIUM LASER APPLICATION;  Surgeon: Franchot Gallo, MD;  [facility];  Service: Urology;  Laterality: Left;  . HOLMIUM LASER APPLICATION Right 1/49/7026   Procedure: HOLMIUM LASER APPLICATION;  Surgeon: Franchot Gallo, MD;  Location: [facility];  Service: Urology;  Laterality: Right;  . MASS EXCISION Left 03/04/2015   Procedure: EXCISION OF SOFT TISSUE NEOPLASM LEFT ARM;  Surgeon: Aviva Signs, MD;  Location: [facility];  Service: General;  Laterality: Left;  . PERCUTANEOUS NEPHROSTOLITHOTOMY Bilateral 12/  2011     [facility]  . POLYPECTOMY N/A 03/17/2013   Procedure: GASTRIC POLYPECTOMY;  Surgeon: Danie Binder, MD;  Location: [facility];  Service: Endoscopy;  Laterality: N/A;  . POLYPECTOMY  02/26/2017   Procedure: POLYPECTOMY;  Surgeon: Danie Binder, MD;  [facility];  Service: Endoscopy;;  polyp at cecum, ascending colon polyps x3, hepatic flexure polyps x8, transverse colon polyps x8   . REMOVAL RIGHT THIGH CYST  2006  . SAVORY DILATION N/A 03/17/2013   Procedure: SAVORY DILATION;  Surgeon: Danie Binder, MD;  Location: [facility];  Service: Endoscopy;  Laterality: N/A;  #12.8, 14, 15, 16 dilators used  . SAVORY DILATION N/A 06/10/2015   Procedure: SAVORY DILATION;  Surgeon: Danie Binder, MD;  [facility];  Service: Endoscopy;  Laterality: N/A;  . TRANSTHORACIC ECHOCARDIOGRAM  01-01-2014   mild LVH/  ef 37-85%/  grade I diastolic dysfunction/  trivial  MR, TR, and PR  . VAGINAL HYSTERECTOMY  1970's    Family History  Problem Relation Age of Onset  . Hypertension Mother   . Diabetes Mother   . Heart failure Mother   . Dementia Mother   . Emphysema Father   . Hypertension Father   . Diabetes Brother   . GER disease Brother   . Hypertension Sister   . Hypertension Sister   . Cancer Unknown        family history   . Diabetes Unknown        family history   . Heart defect Unknown        famiily history   . Arthritis Unknown        family history   . Anesthesia problems Neg Hx   . Hypotension Neg Hx   . Malignant hyperthermia Neg Hx   . Pseudochol deficiency Neg Hx   . Colon cancer Neg Hx     Current Outpatient Medications on File Prior to Visit  Medication Sig Dispense Refill  . amLODipine (NORVASC) 10 MG tablet TAKE 1 TABLET BY MOUTH EVERY DAY (Patient taking differently: Take 10 mg by mouth daily. ) 90 tablet 3  . aspirin EC 325 MG tablet Take 325  mg by mouth daily.    . budesonide-formoterol (SYMBICORT) 80-4.5 MCG/ACT inhaler Inhale 2 puffs into the lungs 2 (two) times daily. 1 Inhaler 3  . ciprofloxacin (CIPRO) 500 MG tablet Take 1 tablet (500 mg total) by mouth 2 (two) times daily. One po bid x 7 days (Patient not taking: Reported on 03/18/2018) 14 tablet 0  . diphenhydramine-acetaminophen (TYLENOL PM) 25-500 MG TABS tablet Take 2 tablets by mouth at bedtime.    . docusate sodium (COLACE) 100 MG capsule Take 200 mg by mouth at bedtime.     . iron polysaccharides (NIFEREX) 150 MG capsule Take 150 mg by mouth daily.    Marland Kitchen KLOR-CON M20 20 MEQ tablet TAKE 1 TABLET BY MOUTH EVERY DAY 90 tablet 0  . lovastatin (MEVACOR) 40 MG tablet TAKE 2 TABLETS (80 MG TOTAL) BY MOUTH AT BEDTIME. (Patient taking differently: Take 40 mg by mouth at bedtime. ) 180 tablet 1  . OXYGEN Inhale into the lungs daily. Bedtime    . pantoprazole (PROTONIX) 40 MG tablet TAKE 1 TABLET (40 MG TOTAL) BY MOUTH 2 (TWO) TIMES DAILY. 180 tablet 1  . PARoxetine  (PAXIL) 40 MG tablet TAKE 1 TABLET BY MOUTH EVERY DAY IN THE MORNING (Patient taking differently: Take 40 mg by mouth every morning. ) 90 tablet 0  . tiZANidine (ZANAFLEX) 2 MG tablet TAKE 1 TABLET BY MOUTH TWICE A DAY AS NEEDED FOR MUSCLE SPASM (Patient taking differently: Take 2 mg by mouth 2 (two) times daily as needed for muscle spasms. ) 30 tablet 1  . TRADJENTA 5 MG TABS tablet TAKE 1 TABLET BY MOUTH EVERY DAY (Patient taking differently: Take 5 mg by mouth daily. ) 90 tablet 1  . traZODone (DESYREL) 100 MG tablet TAKE 1 TABLET BY MOUTH EVERYDAY AT BEDTIME 90 tablet 2   No current facility-administered medications on file prior to visit.     Allergies  Allergen Reactions  . Ace Inhibitors Cough  . Keflex [Cephalexin] Diarrhea and Nausea And Vomiting  . Nitrofurantoin Diarrhea and Nausea And Vomiting  . Penicillins Hives and Other (See Comments)    Reaction:  Blisters on hands/feet  Has patient had a PCN reaction causing immediate rash, facial/tongue/throat swelling, SOB or lightheadedness with hypotension: No Has patient had a PCN reaction causing severe rash involving mucus membranes or skin necrosis: No Has patient had a PCN reaction that required hospitalization No Has patient had a PCN reaction occurring within the last 10 years: No If all of the above answers are "NO", then may proceed with Cephalosporin use.    Social History   Substance and Sexual Activity  Alcohol Use No  . Alcohol/week: 0.0 standard drinks    Social History   Tobacco Use  Smoking Status Former Smoker  . Packs/day: 1.00  . Years: 20.00  . Pack years: 20.00  . Types: Cigarettes  . Start date: 09/21/1974  . Last attempt to quit: 09/20/1988  . Years since quitting: 29.5  Smokeless Tobacco Never Used  Tobacco Comment   Quit x 20 years    Review of Systems  Constitutional: Positive for malaise/fatigue.  HENT: Negative.   Eyes: Negative.   Respiratory: Positive for shortness of breath.    Cardiovascular: Negative.   Gastrointestinal: Negative.   Genitourinary: Positive for urgency.  Musculoskeletal: Positive for back pain and neck pain.  Skin: Negative.   Neurological: Negative.   Endo/Heme/Allergies: Negative.   Psychiatric/Behavioral: Negative.     Objective   Vitals:   03/20/18 1121  BP: 122/69  Pulse: 89  Resp: 20  Temp: (!) 96.9 F (36.1 C)    Physical Exam  Constitutional: She is oriented to person, place, and time. She appears well-developed and well-nourished. No distress.  HENT:  Head: Normocephalic and atraumatic.  Neck: Normal range of motion. Neck supple.  Posterior neck at the base with subcutaneous increased density in a linear fashion.  It is not discrete and there is no drainage or induration noted.  No fluctuance is noted.  Cardiovascular: Normal rate, regular rhythm and normal heart sounds. Exam reveals no gallop and no friction rub.  No murmur heard. Pulmonary/Chest: Effort normal and breath sounds normal. No stridor. No respiratory distress. She has no wheezes. She has no rales.  Lymphadenopathy:    She has no cervical adenopathy.  Neurological: She is alert and oriented to person, place, and time.  Skin: Skin is warm and dry.  Vitals reviewed.   Assessment  Posterior neck mass is probably scarring from a previous cyst or cellulitis.  There is nothing discrete for me to excise. Plan   I did describe my findings with the patient.  She does not want surgery unless absolutely necessary.  I agree with that.  Follow-up as needed.

## 2018-03-24 ENCOUNTER — Ambulatory Visit (INDEPENDENT_AMBULATORY_CARE_PROVIDER_SITE_OTHER): Payer: Medicare Other

## 2018-03-24 ENCOUNTER — Telehealth: Payer: Self-pay

## 2018-03-24 ENCOUNTER — Ambulatory Visit: Payer: Medicare Other

## 2018-03-24 ENCOUNTER — Other Ambulatory Visit: Payer: Self-pay | Admitting: Family Medicine

## 2018-03-24 VITALS — BP 110/64 | HR 89 | Resp 14 | Ht 66.0 in | Wt 210.0 lb

## 2018-03-24 DIAGNOSIS — Z780 Asymptomatic menopausal state: Secondary | ICD-10-CM | POA: Diagnosis not present

## 2018-03-24 DIAGNOSIS — Z0000 Encounter for general adult medical examination without abnormal findings: Secondary | ICD-10-CM | POA: Diagnosis not present

## 2018-03-24 MED ORDER — ALPRAZOLAM 0.5 MG PO TABS: 0.5000 mg | ORAL_TABLET | Freq: Every day | ORAL | 2 refills | Status: AC

## 2018-03-24 NOTE — Progress Notes (Signed)
Xanax

## 2018-03-24 NOTE — Patient Instructions (Signed)
Cheryl Abbott , Thank you for taking time to come for your Medicare Wellness Visit. I appreciate your ongoing commitment to your health goals. Please review the following plan we discussed and let me know if I can assist you in the future.   Screening recommendations/referrals: Colonoscopy: up to date  Mammogram: scheduled  Bone Density:  Recommended yearly ophthalmology/optometry visit for glaucoma screening and checkup Recommended yearly dental visit for hygiene and checkup  Vaccinations: Influenza vaccine: up to date  Pneumococcal vaccine: up to date  Tdap vaccine: up to date  Shingles vaccine:   Advanced directives: form given   Conditions/risks identified: fall risk  Next appointment: wellness in 1 year    Preventive Care 73 Years and Older, Female Preventive care refers to lifestyle choices and visits with your health care provider that can promote health and wellness. What does preventive care include?  A yearly physical exam. This is also called an annual well check.  Dental exams once or twice a year.  Routine eye exams. Ask your health care provider how often you should have your eyes checked.  Personal lifestyle choices, including:  Daily care of your teeth and gums.  Regular physical activity.  Eating a healthy diet.  Avoiding tobacco and drug use.  Limiting alcohol use.  Practicing safe sex.  Taking low-dose aspirin every day.  Taking vitamin and mineral supplements as recommended by your health care provider. What happens during an annual well check? The services and screenings done by your health care provider during your annual well check will depend on your age, overall health, lifestyle risk factors, and family history of disease. Counseling  Your health care provider may ask you questions about your:  Alcohol use.  Tobacco use.  Drug use.  Emotional well-being.  Home and relationship well-being.  Sexual activity.  Eating  habits.  History of falls.  Memory and ability to understand (cognition).  Work and work Statistician.  Reproductive health. Screening  You may have the following tests or measurements:  Height, weight, and BMI.  Blood pressure.  Lipid and cholesterol levels. These may be checked every 5 years, or more frequently if you are over 21 years old.  Skin check.  Lung cancer screening. You may have this screening every year starting at age 21 if you have a 30-pack-year history of smoking and currently smoke or have quit within the past 15 years.  Fecal occult blood test (FOBT) of the stool. You may have this test every year starting at age 73.  Flexible sigmoidoscopy or colonoscopy. You may have a sigmoidoscopy every 5 years or a colonoscopy every 10 years starting at age 27.  Hepatitis C blood test.  Hepatitis B blood test.  Sexually transmitted disease (STD) testing.  Diabetes screening. This is done by checking your blood sugar (glucose) after you have not eaten for a while (fasting). You may have this done every 1-3 years.  Bone density scan. This is done to screen for osteoporosis. You may have this done starting at age 46.  Mammogram. This may be done every 1-2 years. Talk to your health care provider about how often you should have regular mammograms. Talk with your health care provider about your test results, treatment options, and if necessary, the need for more tests. Vaccines  Your health care provider may recommend certain vaccines, such as:  Influenza vaccine. This is recommended every year.  Tetanus, diphtheria, and acellular pertussis (Tdap, Td) vaccine. You may need a Td booster every 10  years.  Zoster vaccine. You may need this after age 3.  Pneumococcal 13-valent conjugate (PCV13) vaccine. One dose is recommended after age 37.  Pneumococcal polysaccharide (PPSV23) vaccine. One dose is recommended after age 52. Talk to your health care provider about which  screenings and vaccines you need and how often you need them. This information is not intended to replace advice given to you by your health care provider. Make sure you discuss any questions you have with your health care provider. Document Released: 06/24/2015 Document Revised: 02/15/2016 Document Reviewed: 03/29/2015 Elsevier Interactive Patient Education  2017 [employer] Prevention in the Home Falls can cause injuries. They can happen to people of all ages. There are many things you can do to make your home safe and to help prevent falls. What can I do on the outside of my home?  Regularly fix the edges of walkways and driveways and fix any cracks.  Remove anything that might make you trip as you walk through a door, such as a raised step or threshold.  Trim any bushes or trees on the path to your home.  Use bright outdoor lighting.  Clear any walking paths of anything that might make someone trip, such as rocks or tools.  Regularly check to see if handrails are loose or broken. Make sure that both sides of any steps have handrails.  Any raised decks and porches should have guardrails on the edges.  Have any leaves, snow, or ice cleared regularly.  Use sand or salt on walking paths during winter.  Clean up any spills in your garage right away. This includes oil or grease spills. What can I do in the bathroom?  Use night lights.  Install grab bars by the toilet and in the tub and shower. Do not use towel bars as grab bars.  Use non-skid mats or decals in the tub or shower.  If you need to sit down in the shower, use a plastic, non-slip stool.  Keep the floor dry. Clean up any water that spills on the floor as soon as it happens.  Remove soap buildup in the tub or shower regularly.  Attach bath mats securely with double-sided non-slip rug tape.  Do not have throw rugs and other things on the floor that can make you trip. What can I do in the bedroom?  Use  night lights.  Make sure that you have a light by your bed that is easy to reach.  Do not use any sheets or blankets that are too big for your bed. They should not hang down onto the floor.  Have a firm chair that has side arms. You can use this for support while you get dressed.  Do not have throw rugs and other things on the floor that can make you trip. What can I do in the kitchen?  Clean up any spills right away.  Avoid walking on wet floors.  Keep items that you use a lot in easy-to-reach places.  If you need to reach something above you, use a strong step stool that has a grab bar.  Keep electrical cords out of the way.  Do not use floor polish or wax that makes floors slippery. If you must use wax, use non-skid floor wax.  Do not have throw rugs and other things on the floor that can make you trip. What can I do with my stairs?  Do not leave any items on the stairs.  Make sure that  there are handrails on both sides of the stairs and use them. Fix handrails that are broken or loose. Make sure that handrails are as long as the stairways.  Check any carpeting to make sure that it is firmly attached to the stairs. Fix any carpet that is loose or worn.  Avoid having throw rugs at the top or bottom of the stairs. If you do have throw rugs, attach them to the floor with carpet tape.  Make sure that you have a light switch at the top of the stairs and the bottom of the stairs. If you do not have them, ask someone to add them for you. What else can I do to help prevent falls?  Wear shoes that:  Do not have high heels.  Have rubber bottoms.  Are comfortable and fit you well.  Are closed at the toe. Do not wear sandals.  If you use a stepladder:  Make sure that it is fully opened. Do not climb a closed stepladder.  Make sure that both sides of the stepladder are locked into place.  Ask someone to hold it for you, if possible.  Clearly mark and make sure that you  can see:  Any grab bars or handrails.  First and last steps.  Where the edge of each step is.  Use tools that help you move around (mobility aids) if they are needed. These include:  Canes.  Walkers.  Scooters.  Crutches.  Turn on the lights when you go into a dark area. Replace any light bulbs as soon as they burn out.  Set up your furniture so you have a clear path. Avoid moving your furniture around.  If any of your floors are uneven, fix them.  If there are any pets around you, be aware of where they are.  Review your medicines with your doctor. Some medicines can make you feel dizzy. This can increase your chance of falling. Ask your doctor what other things that you can do to help prevent falls. This information is not intended to replace advice given to you by your health care provider. Make sure you discuss any questions you have with your health care provider. Document Released: 03/24/2009 Document Revised: 11/03/2015 Document Reviewed: 07/02/2014 Elsevier Interactive Patient Education  2017 [employer].

## 2018-03-24 NOTE — Telephone Encounter (Signed)
Refill sent in

## 2018-03-24 NOTE — Telephone Encounter (Signed)
Here for AWV- states she needs a refill of xanax. Said the dose was reduced last month on 09/14 to 1 tab qd #30 and she is out today. I don't see the xanax on her current list, but I see trazodone. Should she be on both? If not which one do you want me to d/c

## 2018-03-24 NOTE — Progress Notes (Signed)
Subjective:   Cheryl Abbott is a 72 y.o. female who presents for Medicare Annual (Subsequent) preventive examination.  Review of Systems:   Cardiac Risk Factors include: advanced age (>33men, >75 women);dyslipidemia;hypertension;microalbuminuria;obesity (BMI >30kg/m2);sedentary lifestyle     Objective:     Vitals: BP 110/64    Pulse 89    Resp 14    Ht 5\' 6"  (1.676 m)    Wt 210 lb (95.3 kg)    SpO2 90%    BMI 33.89 kg/m   Body mass index is 33.89 kg/m.  Advanced Directives 03/24/2018 03/18/2018 03/11/2018 11/29/2017 11/22/2017 09/19/2017 09/12/2017  Does Patient Have a Medical Advance Directive? No Yes No No No Yes Yes  Type of Advance Directive - Living will - - - Living will Living will  Does patient want to make changes to medical advance directive? Yes (ED - Information included in AVS) No - Patient declined - - - - -  Copy of Healthcare Power of Attorney in Chart? - - - - - - -  Would patient like information on creating a medical advance directive? - No - Patient declined - No - Patient declined No - Patient declined No - Patient declined No - Patient declined  Pre-existing out of facility DNR order (yellow form or pink MOST form) - - - - - - -    Tobacco Social History   Tobacco Use  Smoking Status Former Smoker   Packs/day: 1.00   Years: 20.00   Pack years: 20.00   Types: Cigarettes   Start date: 09/21/1974   Last attempt to quit: 09/20/1988   Years since quitting: 29.5  Smokeless Tobacco Never Used  Tobacco Comment   Quit x 20 years     Counseling given: Not Answered Comment: Quit x 20 years   Clinical Intake:  Pre-visit preparation completed: No  Pain : No/denies pain Pain Score: 0-No pain     BMI - recorded: 33.9 Nutritional Status: BMI > 30  Obese Nutritional Risks: None Diabetes: No CBG done?: No Did pt. 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   Chronic back pain    Depression    Fatty liver    GERD (gastroesophageal reflux disease)    Headache    History of adenomatous polyp of colon    2008   History of kidney stones    Hypercalcemia    Hyperlipidemia    Hypertension    Left ureteral stone    Lipoma of arm 01/19/2015   Lumbar disc disease with radiculopathy    Nephrolithiasis    Neuropathy, peripheral    OSA treated with BiPAP    per study 2007   Sciatica    Sigmoid diverticulosis    Type 2 diabetes mellitus ([place])    Past Surgical History:  Procedure Laterality Date   BIOPSY N/A 03/17/2013   Procedure: GASTRIC BIOPSIES;  Surgeon: Danie Binder, MD;  Location: [facility];  Service: Endoscopy;  Laterality: N/A;   BIOPSY  06/10/2015   Procedure: BIOPSY;  Surgeon: Danie Binder, MD;  [facility];  Service: Endoscopy;;  gastric biopsy   CARDIAC CATHETERIZATION  05-11-2003  dr Shelva Majestic ([facility])   Abnormal cardiolite/   Normal coronary arteries and normal LVF,  ef  63%   CARDIOVASCULAR STRESS TEST  01-01-2014   normal lexiscan cardiolite/  no ischemia/ infarct/  normal LV function and wall motion ,  ef 81%   COLONOSCOPY  last one 10/02/2011   MOD [place] TICS, IH, NEXT TCS APR 2018   COLONOSCOPY WITH PROPOFOL N/A 02/26/2017   Procedure: COLONOSCOPY WITH PROPOFOL;  Surgeon: Danie Binder, MD;  [facility];  Service: Endoscopy;  Laterality: N/A;  11:00am   CYSTO/   RIGHT URETEROSOCOPY LASER LITHOTRIPSY STONE EXTRACTION  10-13-2004   CYSTO/  RIGHT RETROGRADE PYELOGRAM/  PLACMENT RIGHT URETERAL STENT   01-10-2010   CYSTOSCOPY W/ URETERAL STENT PLACEMENT Right 08/19/2015   Procedure: CYSTOSCOPY WITH RETROGRADE PYELOGRAM/URETERAL STENT PLACEMENT;  Surgeon: Franchot Gallo, MD;  Location: [facility];  Service: Urology;  Laterality: Right;   CYSTOSCOPY WITH RETROGRADE PYELOGRAM, URETEROSCOPY AND STENT PLACEMENT Left 10/21/2014   Procedure: CYSTOSCOPY WITH RETROGRADE PYELOGRAM, URETEROSCOPY AND STENT PLACEMENT;  Surgeon: Franchot Gallo, MD;  Location: [facility];  Service: Urology;  Laterality: Left;   CYSTOSCOPY WITH RETROGRADE PYELOGRAM, URETEROSCOPY AND STENT PLACEMENT Right 11/22/2015   Procedure: CYSTOSCOPY, RIGHT URETERAL STENT REMOVAL; RIGHT RETROGRADE PYELOGRAM, RIGHT URETEROSCOPY WITH BALLOON DILATION; RIGHT URETERAL STENT PLACEMENT;  Surgeon: Franchot Gallo, MD;  Location: [facility];  Service: Urology;  Laterality: Right;   CYSTOSCOPY/RETROGRADE/URETEROSCOPY/STONE EXTRACTION WITH BASKET Bilateral 08/02/2015   Procedure: CYSTOSCOPY; BILATERAL RETROGRADE PYELOGRAMS; BILATERAL URETEROSCOPY, STONE EXTRACTION WITH BASKET;  Surgeon: Franchot Gallo, MD;  [facility];  Service: Urology;  Laterality: Bilateral;   CYSTOSCOPY/RETROGRADE/URETEROSCOPY/STONE EXTRACTION WITH BASKET Right 06/28/2015   Procedure: CYSTOSCOPY/RETROGRADE/URETEROSCOPY/STONE EXTRACTION WITH BASKET, RIGHT URETERAL DOUBLE J STENT PLACEMENT;  Surgeon: Franchot Gallo, MD;  [facility];  Service: Urology;  Laterality: Right;   ESOPHAGOGASTRODUODENOSCOPY (EGD) WITH PROPOFOL N/A 03/17/2013   Procedure: ESOPHAGOGASTRODUODENOSCOPY (EGD) WITH PROPOFOL;  Surgeon: Danie Binder, MD;  [facility];  Service: Endoscopy;  Laterality: N/A;   ESOPHAGOGASTRODUODENOSCOPY (EGD) WITH PROPOFOL N/A 06/10/2015   Distal gastritis, distal esophageal stricture s/p dilation   ESOPHAGOGASTRODUODENOSCOPY (EGD) WITH PROPOFOL N/A 02/26/2017   Procedure: ESOPHAGOGASTRODUODENOSCOPY (EGD) WITH PROPOFOL;  Surgeon: Danie Binder, MD;  [facility];  Service: Endoscopy;  Laterality: N/A;   FLEXIBLE SIGMOIDOSCOPY  09/11/2011   JIR:CVELFYBO Internal hemorrhoids   HOLMIUM LASER APPLICATION Left 1/75/1025   Procedure: HOLMIUM LASER APPLICATION;  Surgeon: Franchot Gallo, MD;  Location[facility];  Service: Urology;  Laterality: Left;   HOLMIUM LASER APPLICATION Right 8/52/7782   Procedure: HOLMIUM LASER APPLICATION;  Surgeon: Franchot Gallo, MD;  [facility];  Service: Urology;  Laterality: Right;   MASS EXCISION Left 03/04/2015   Procedure: EXCISION OF SOFT TISSUE NEOPLASM LEFT ARM;  Surgeon: Aviva Signs, MD;  Location: [facility];  Service: General;  Laterality: Left;   PERCUTANEOUS NEPHROSTOLITHOTOMY Bilateral 12/  2011     [facility]   POLYPECTOMY N/A 03/17/2013   Procedure: GASTRIC POLYPECTOMY;  Surgeon: Danie Binder, MD;  [facility]: [facility];  Service: Endoscopy;  Laterality: N/A;   POLYPECTOMY  02/26/2017   Procedure: POLYPECTOMY;  Surgeon: Danie Binder, MD;  [facility];  Service: Endoscopy;;  polyp at cecum, ascending colon polyps x3, hepatic flexure  polyps x8, transverse colon polyps x8    REMOVAL RIGHT THIGH CYST  2006   SAVORY DILATION N/A 03/17/2013   Procedure: SAVORY DILATION;  Surgeon: Danie Binder, MD;  Location: [facility];  Service: Endoscopy;  Laterality: N/A;  #12.8, 14, 15, 16 dilators used   SAVORY DILATION N/A 06/10/2015   Procedure: SAVORY DILATION;  Surgeon: Danie Binder, MD;  [facility];  Service: Endoscopy;  Laterality: N/A;   TRANSTHORACIC ECHOCARDIOGRAM  01-01-2014   mild LVH/  ef 42-35%/  grade I diastolic dysfunction/  trivial MR, TR, and PR   VAGINAL HYSTERECTOMY  38's   Family History  Problem Relation Age of Onset   Hypertension Mother    Diabetes Mother    Heart failure Mother    Dementia Mother    Emphysema Father    Hypertension Father    Diabetes Brother    GER disease Brother    Hypertension  Sister    Hypertension Sister    Cancer Unknown        family history    Diabetes Unknown        family history    Heart defect Unknown        famiily history    Arthritis Unknown        family history    Anesthesia problems Neg Hx    Hypotension Neg Hx    Malignant hyperthermia Neg Hx    Pseudochol deficiency Neg Hx    Colon cancer Neg Hx    Social History   Socioeconomic History   Marital status: Divorced    Spouse name: Not on file   Number of children: 2   Years of education: Not on file   Highest education level: Not on file  Occupational History   Occupation: retired   Scientist, product/process development strain: Not very hard   Food insecurity:    Worry: Never true    Inability: Never true   Transportation needs:    Medical: No    Non-medical: No  Tobacco Use   Smoking status: Former Smoker    Packs/day: 1.00    Years: 20.00    Pack years: 20.00    Types: Cigarettes    Start date: 09/21/1974    Last attempt to quit: 09/20/1988    Years since quitting: 29.5   Smokeless tobacco: Never Used   Tobacco comment: Quit x 20 years  Substance and Sexual Activity   Alcohol use: No    Alcohol/week: 0.0 standard drinks   Drug use: No   Sexual activity: Never    Birth control/protection: Surgical  Lifestyle   Physical activity:    Days per week: 0 days    Minutes per session: 0 min   Stress: Only a little  Relationships   Social connections:    Talks on phone: Not on file    Gets together: Not on file    Attends religious service: Not on file    Active member of club or organization: Not on file    Attends meetings of clubs or organizations: Not on file    Relationship status: Divorced  Other Topics Concern   Not on file  Social History Narrative   Patient is right handed   Patient drinks some caffeine daily.    Outpatient Encounter Medications as of 03/24/2018  Medication Sig   aspirin EC 325 MG tablet Take 325 mg by mouth  daily.   budesonide-formoterol (SYMBICORT) 80-4.5 MCG/ACT inhaler  Inhale 2 puffs into the lungs 2 (two) times daily.   ciprofloxacin (CIPRO) 500 MG tablet Take 1 tablet (500 mg total) by mouth 2 (two) times daily. One po bid x 7 days   diphenhydramine-acetaminophen (TYLENOL PM) 25-500 MG TABS tablet Take 2 tablets by mouth at bedtime.   docusate sodium (COLACE) 100 MG capsule Take 200 mg by mouth at bedtime.    iron polysaccharides (NIFEREX) 150 MG capsule Take 150 mg by mouth daily.   KLOR-CON M20 20 MEQ tablet TAKE 1 TABLET BY MOUTH EVERY DAY   lovastatin (MEVACOR) 40 MG tablet TAKE 2 TABLETS (80 MG TOTAL) BY MOUTH AT BEDTIME. (Patient taking differently: Take 40 mg by mouth at bedtime. )   OXYGEN Inhale into the lungs daily. Bedtime   pantoprazole (PROTONIX) 40 MG tablet TAKE 1 TABLET (40 MG TOTAL) BY MOUTH 2 (TWO) TIMES DAILY.   PARoxetine (PAXIL) 40 MG tablet TAKE 1 TABLET BY MOUTH EVERY DAY IN THE MORNING   tiZANidine (ZANAFLEX) 2 MG tablet TAKE 1 TABLET BY MOUTH TWICE A DAY AS NEEDED FOR MUSCLE SPASM (Patient taking differently: Take 2 mg by mouth 2 (two) times daily as needed for muscle spasms. )   TRADJENTA 5 MG TABS tablet TAKE 1 TABLET BY MOUTH EVERY DAY (Patient taking differently: Take 5 mg by mouth daily. )   traZODone (DESYREL) 100 MG tablet TAKE 1 TABLET BY MOUTH EVERYDAY AT BEDTIME   amLODipine (NORVASC) 10 MG tablet TAKE 1 TABLET BY MOUTH EVERY DAY (Patient taking differently: Take 10 mg by mouth daily. )   No facility-administered encounter medications on file as of 03/24/2018.     Activities of Daily Living In your present state of health, do you have any difficulty performing the following activities: 03/24/2018 03/24/2018  Hearing? N N  Vision? N N  Difficulty concentrating or making decisions? Tempie Donning  Walking or climbing stairs? Y Y  Dressing or bathing? N Y  Doing errands, shopping? N -  Preparing Food and eating ? N N  Using the Toilet? N N  In the  past six months, have you accidently leaked urine? Y -  Do you have problems with loss of bowel control? N -  Managing your Medications? N -  Managing your Finances? N -  Housekeeping or managing your Housekeeping? N N  Some recent data might be hidden    Patient Care Team: [name], MD as PCP - [facility] [place], Alphonse Guild, MD as PCP - Cardiology (Cardiology) Danie Binder, MD (Gastroenterology) Irine Seal, MD as Attending Physician (Urology) Kathrynn Ducking, MD (Neurology) Franchot Gallo, MD as Consulting Physician (Urology) Rutherford Guys, MD as Consulting Physician (Ophthalmology)    Assessment:   This is a routine wellness examination for [place].  Exercise Activities and Dietary recommendations Current Exercise Habits: The patient does not participate in regular exercise at present, Exercise limited by: orthopedic condition(s);respiratory conditions(s)  Goals     Blood Pressure < 140/90     HEMOGLOBIN A1C < 7.0     LDL CALC < 100       Fall Risk Fall Risk  03/24/2018 02/17/2018 12/11/2017 11/05/2017 03/20/2017  Falls in the past year? Yes No No No Yes  Number falls in past yr: - - - - 2 or more  Injury with Fall? - - - - No  Comment - - - - due to weakness and dehydration after her colonoscopy  Risk Factor Category  - - - - -  Risk for fall due to : - - - - Impaired balance/gait  Follow up - - - - Falls evaluation completed;Education provided;Falls prevention discussed   Is the patient's home free of loose throw rugs in walkways, pet beds, electrical cords, etc?   yes      Grab bars in the bathroom? yes      Handrails on the stairs?   yes      Adequate lighting?   yes  Timed Get Up and Go performed:   Depression Screen PHQ 2/9 Scores 03/24/2018 02/17/2018 12/11/2017 11/05/2017  PHQ - 2 Score 4 4 4  0  PHQ- 9 Score 9 8 16  -     Cognitive Function     6CIT Screen 03/24/2018 03/20/2017  What Year? 0 points 0 points  What month? 0 points 0 points    What time? 0 points 0 points  Count back from 20 0 points 0 points  Months in reverse 0 points 0 points  Repeat phrase 2 points 0 points  Total Score 2 0    Immunization History  Administered Date(s) Administered   Influenza Split 03/17/2015   Influenza Whole 04/15/2007, 03/17/2009   Influenza,inj,Quad PF,6+ Mos 02/04/2014, 02/12/2017, 02/17/2018   Pneumococcal Conjugate-13 12/21/2013   Pneumococcal Polysaccharide-23 01/19/2004, 05/23/2010   Td 06/07/2003   Tdap 06/14/2011   Zoster 02/02/2008    Qualifies for Shingles Vaccine?  Screening Tests Health Maintenance  Topic Date Due   FOOT EXAM  03/18/2018   OPHTHALMOLOGY EXAM  04/17/2018   HEMOGLOBIN A1C  06/26/2018   URINE MICROALBUMIN  12/20/2018   COLONOSCOPY  02/27/2019   MAMMOGRAM  04/30/2019   TETANUS/TDAP  06/13/2021   INFLUENZA VACCINE  Completed   DEXA SCAN  Completed   Hepatitis C Screening  Completed   PNA vac Low Risk Adult  Completed    Cancer Screenings: Lung: Low Dose CT Chest recommended if Age 74-80 years, 30 pack-year currently smoking OR have quit w/in 15years. Patient does not qualify. Breast:  Up to date on Mammogram? No   Up to date of Bone Density/Dexa? Yes Colorectal: up to date   Additional Screenings:  Hepatitis C Screening:      Plan:     I have personally reviewed and noted the following in the patients chart:    Medical and social history  Use of alcohol, tobacco or illicit drugs   Current medications and supplements  Functional ability and status  Nutritional status  Physical activity  Advanced directives  List of other physicians  Hospitalizations, surgeries, and ER visits in previous 12 months  Vitals  Screenings to include cognitive, depression, and falls  Referrals and appointments  In addition, I have reviewed and discussed with patient certain preventive protocols, quality metrics, and best practice recommendations. A written  personalized care plan for preventive services as well as general preventive health recommendations were provided to patient.     Duane Lope Rakes  03/24/2018

## 2018-03-25 DIAGNOSIS — I739 Peripheral vascular disease, unspecified: Secondary | ICD-10-CM | POA: Diagnosis not present

## 2018-03-25 DIAGNOSIS — M79672 Pain in left foot: Secondary | ICD-10-CM | POA: Diagnosis not present

## 2018-03-25 DIAGNOSIS — M79671 Pain in right foot: Secondary | ICD-10-CM | POA: Diagnosis not present

## 2018-03-28 ENCOUNTER — Ambulatory Visit: Payer: Medicare Other | Admitting: Gastroenterology

## 2018-04-01 ENCOUNTER — Other Ambulatory Visit: Payer: Self-pay | Admitting: Family Medicine

## 2018-04-02 ENCOUNTER — Ambulatory Visit: Payer: Medicare Other | Admitting: Gastroenterology

## 2018-04-07 ENCOUNTER — Telehealth: Payer: Self-pay | Admitting: Family Medicine

## 2018-04-07 NOTE — Telephone Encounter (Signed)
Pt is  Returning your call

## 2018-04-08 NOTE — Telephone Encounter (Signed)
Returned patients call. No answer. Left vm requesting call back. Looked back through chart and no one has called patient recently.

## 2018-04-10 ENCOUNTER — Other Ambulatory Visit: Payer: Self-pay | Admitting: Family Medicine

## 2018-04-10 NOTE — Telephone Encounter (Signed)
Left message requesting  call back.

## 2018-04-14 NOTE — Telephone Encounter (Signed)
Left message requesting  call back.

## 2018-04-15 NOTE — Telephone Encounter (Signed)
Patient came into the office. No recent calls except when trying to return her phone call.

## 2018-04-17 ENCOUNTER — Ambulatory Visit: Payer: Medicare Other | Admitting: Family Medicine

## 2018-04-19 DIAGNOSIS — R0902 Hypoxemia: Secondary | ICD-10-CM | POA: Diagnosis not present

## 2018-04-30 ENCOUNTER — Encounter (HOSPITAL_COMMUNITY): Payer: Medicare Other

## 2018-05-02 ENCOUNTER — Ambulatory Visit (HOSPITAL_COMMUNITY)
Admission: RE | Admit: 2018-05-02 | Discharge: 2018-05-02 | Disposition: A | Discharge status: Home / Self Care | Payer: Medicare Other | Source: Ambulatory Visit | Attending: Family Medicine | Admitting: Family Medicine

## 2018-05-02 DIAGNOSIS — Z780 Asymptomatic menopausal state: Secondary | ICD-10-CM | POA: Diagnosis not present

## 2018-05-02 DIAGNOSIS — M8589 Other specified disorders of bone density and structure, multiple sites: Secondary | ICD-10-CM | POA: Diagnosis not present

## 2018-05-02 DIAGNOSIS — Z1231 Encounter for screening mammogram for malignant neoplasm of breast: Secondary | ICD-10-CM | POA: Insufficient documentation

## 2018-05-05 ENCOUNTER — Telehealth: Payer: Self-pay | Admitting: *Deleted

## 2018-05-05 ENCOUNTER — Other Ambulatory Visit: Payer: Self-pay | Admitting: Family Medicine

## 2018-05-05 DIAGNOSIS — R1900 Intra-abdominal and pelvic swelling, mass and lump, unspecified site: Secondary | ICD-10-CM

## 2018-05-05 DIAGNOSIS — R109 Unspecified abdominal pain: Secondary | ICD-10-CM

## 2018-05-05 NOTE — Telephone Encounter (Signed)
error 

## 2018-05-05 NOTE — Telephone Encounter (Signed)
With c/o large stomach and pain, needs to go to the eD for eva;luation and necessary tests. I recommend she keep an appointment with her GI Doctor, as [place], regarding these concerns , she needs to call for appointmenmt / they will call , I am entering the referral, let her know pls

## 2018-05-05 NOTE — Progress Notes (Signed)
amb gastro  

## 2018-05-05 NOTE — Telephone Encounter (Signed)
Pt is requesting MRI on stomach. Stated she has been having lots of pain and her stomach is very large. BMs are very regular at the time.

## 2018-05-06 ENCOUNTER — Emergency Department (HOSPITAL_COMMUNITY): Payer: Medicare Other

## 2018-05-06 ENCOUNTER — Emergency Department (HOSPITAL_COMMUNITY)
Admission: EM | Admit: 2018-05-06 | Discharge: 2018-05-06 | Disposition: A | Discharge status: Home / Self Care | Payer: Medicare Other | Attending: Emergency Medicine | Admitting: Emergency Medicine

## 2018-05-06 ENCOUNTER — Encounter (HOSPITAL_COMMUNITY): Payer: Self-pay | Admitting: Emergency Medicine

## 2018-05-06 ENCOUNTER — Other Ambulatory Visit: Payer: Self-pay

## 2018-05-06 DIAGNOSIS — Z87891 Personal history of nicotine dependence: Secondary | ICD-10-CM | POA: Insufficient documentation

## 2018-05-06 DIAGNOSIS — Z859 Personal history of malignant neoplasm, unspecified: Secondary | ICD-10-CM | POA: Insufficient documentation

## 2018-05-06 DIAGNOSIS — R140 Abdominal distension (gaseous): Secondary | ICD-10-CM | POA: Insufficient documentation

## 2018-05-06 DIAGNOSIS — N390 Urinary tract infection, site not specified: Secondary | ICD-10-CM | POA: Diagnosis not present

## 2018-05-06 DIAGNOSIS — Z79899 Other long term (current) drug therapy: Secondary | ICD-10-CM | POA: Insufficient documentation

## 2018-05-06 DIAGNOSIS — N183 Chronic kidney disease, stage 3 (moderate): Secondary | ICD-10-CM | POA: Insufficient documentation

## 2018-05-06 DIAGNOSIS — E876 Hypokalemia: Secondary | ICD-10-CM

## 2018-05-06 DIAGNOSIS — Z7982 Long term (current) use of aspirin: Secondary | ICD-10-CM | POA: Diagnosis not present

## 2018-05-06 DIAGNOSIS — Z7984 Long term (current) use of oral hypoglycemic drugs: Secondary | ICD-10-CM | POA: Diagnosis not present

## 2018-05-06 DIAGNOSIS — E1122 Type 2 diabetes mellitus with diabetic chronic kidney disease: Secondary | ICD-10-CM | POA: Insufficient documentation

## 2018-05-06 DIAGNOSIS — I129 Hypertensive chronic kidney disease with stage 1 through stage 4 chronic kidney disease, or unspecified chronic kidney disease: Secondary | ICD-10-CM | POA: Diagnosis not present

## 2018-05-06 LAB — URINALYSIS, ROUTINE W REFLEX MICROSCOPIC
Bacteria, UA: NONE SEEN
Bilirubin Urine: NEGATIVE
Glucose, UA: NEGATIVE mg/dL
Ketones, ur: NEGATIVE mg/dL
Nitrite: NEGATIVE
Protein, ur: NEGATIVE mg/dL
Specific Gravity, Urine: 1.012 (ref 1.005–1.030)
pH: 6 (ref 5.0–8.0)

## 2018-05-06 LAB — CBC WITH DIFFERENTIAL/PLATELET
Abs Immature Granulocytes: 0.03 10*3/uL (ref 0.00–0.07)
Basophils Absolute: 0 10*3/uL (ref 0.0–0.1)
Basophils Relative: 1 %
Eosinophils Absolute: 0.2 10*3/uL (ref 0.0–0.5)
Eosinophils Relative: 2 %
HCT: 42.6 % (ref 36.0–46.0)
Hemoglobin: 13.4 g/dL (ref 12.0–15.0)
Immature Granulocytes: 0 %
Lymphocytes Relative: 22 %
Lymphs Abs: 1.7 10*3/uL (ref 0.7–4.0)
MCH: 32 pg (ref 26.0–34.0)
MCHC: 31.5 g/dL (ref 30.0–36.0)
MCV: 101.7 fL — ABNORMAL HIGH (ref 80.0–100.0)
Monocytes Absolute: 0.6 10*3/uL (ref 0.1–1.0)
Monocytes Relative: 8 %
Neutro Abs: 5.4 10*3/uL (ref 1.7–7.7)
Neutrophils Relative %: 67 %
Platelets: 293 10*3/uL (ref 150–400)
RBC: 4.19 MIL/uL (ref 3.87–5.11)
RDW: 14.3 % (ref 11.5–15.5)
WBC: 7.9 10*3/uL (ref 4.0–10.5)
nRBC: 0 % (ref 0.0–0.2)

## 2018-05-06 LAB — COMPREHENSIVE METABOLIC PANEL
ALT: 26 U/L (ref 0–44)
AST: 30 U/L (ref 15–41)
Albumin: 4.2 g/dL (ref 3.5–5.0)
Alkaline Phosphatase: 75 U/L (ref 38–126)
Anion gap: 7 (ref 5–15)
BUN: 14 mg/dL (ref 8–23)
CO2: 24 mmol/L (ref 22–32)
Calcium: 9.6 mg/dL (ref 8.9–10.3)
Chloride: 103 mmol/L (ref 98–111)
Creatinine, Ser: 1.73 mg/dL — ABNORMAL HIGH (ref 0.44–1.00)
GFR calc Af Amer: 33 mL/min — ABNORMAL LOW (ref >60)
GFR calc non Af Amer: 29 mL/min — ABNORMAL LOW (ref >60)
Glucose, Bld: 111 mg/dL — ABNORMAL HIGH (ref 70–99)
Potassium: 3.1 mmol/L — ABNORMAL LOW (ref 3.5–5.1)
Sodium: 134 mmol/L — ABNORMAL LOW (ref 135–145)
Total Bilirubin: 0.6 mg/dL (ref 0.3–1.2)
Total Protein: 8.7 g/dL — ABNORMAL HIGH (ref 6.5–8.1)

## 2018-05-06 LAB — LIPASE, BLOOD: Lipase: 38 U/L (ref 11–51)

## 2018-05-06 MED ORDER — POTASSIUM CHLORIDE CRYS ER 20 MEQ PO TBCR
40.0000 meq | EXTENDED_RELEASE_TABLET | Freq: Once | ORAL | Status: AC
  Administered 2018-05-06: 40 meq via ORAL
  Filled 2018-05-06: qty 2

## 2018-05-06 MED ORDER — POTASSIUM CHLORIDE ER 10 MEQ PO TBCR: 10.0000 meq | EXTENDED_RELEASE_TABLET | Freq: Every day | ORAL | 0 refills | Status: DC

## 2018-05-06 MED ORDER — LEVOFLOXACIN 500 MG PO TABS: 500.0000 mg | ORAL_TABLET | Freq: Every day | ORAL | 0 refills | Status: DC

## 2018-05-06 NOTE — Telephone Encounter (Signed)
Left generic message on both numbers requesting call back.

## 2018-05-06 NOTE — ED Notes (Signed)
Pt states " I had a worm in my poop on Saturday and want to get that checked out"

## 2018-05-06 NOTE — ED Triage Notes (Signed)
Patient complaining of abdominal swelling and pain since January 2019.

## 2018-05-06 NOTE — Telephone Encounter (Signed)
Spoke with patient and advised of Dr.Simpson's recommendations with verbal understanding.

## 2018-05-06 NOTE — ED Provider Notes (Signed)
[facility] EMERGENCY DEPARTMENT Provider Note   CSN: [id number] Arrival date & time: 05/06/18  1535     History   Chief Complaint Chief Complaint  Patient presents with  . Abdominal Pain    HPI Cheryl Abbott is a 72 y.o. female.  HPI Patient presents with roughly 1 year of abdominal distention and episodic pain.  Currently denying any pain.  States that she takes Colace to have regular bowel movements.  Otherwise, she gets "locked up".  Denies any nausea or vomiting.  Is concerned she may have passed a worm in her stool 3 days ago.  She has had no international travel or known exposures.  She has an appointment to follow-up with the gastroenterologist.     Past Medical History:  Diagnosis Date  . Anemia   . Anxiety   . Anxiety disorder   . Arthritis   . Cancer ([place])    acute myeloma  . 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 Chronic back pain   . Depression   . Fatty liver   . GERD (gastroesophageal reflux disease)   . Headache   . History of adenomatous polyp of colon    2008  . History of kidney stones   . Hypercalcemia   . Hyperlipidemia   . Hypertension   . Left ureteral stone   . Lipoma of arm 01/19/2015  . Lumbar disc disease with radiculopathy   . Nephrolithiasis   . Neuropathy, peripheral   . OSA treated with BiPAP    per study 2007  . Sciatica   . Sigmoid diverticulosis   . Type 2 diabetes mellitus [facility])     Patient Active Problem List   Diagnosis Date Noted  . Shoulder pain, left 02/28/2018  . Palpable mass of neck 02/28/2018  . Exertional dyspnea 02/28/2018  . Insomnia 11/09/2017  . Generalized pain 11/09/2017  . Multiple myeloma ([place]) 03/15/2017  . Urinary frequency 02/28/2017  . SIRS (systemic inflammatory response syndrome) ([place]) 02/26/2017  . 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 Morbid obesity ([place]) 10/14/2016  . Vaginal dryness, menopausal 10/14/2016  . Neuropathic pain of both feet 04/11/2016  . TIA (transient ischemic  attack) 02/26/2016  . Lactose intolerance in adult 02/01/2016  . Brain TIA 08/19/2015  . CKD (chronic kidney disease) stage 3, GFR 30-59 ml/min (HCC) 07/12/2015  . Lipoma of arm 01/19/2015  . Anemia 10/14/2014  . Lumbar back pain with radiculopathy affecting left lower extremity 05/11/2014  . Nocturnal hypoxia 02/04/2014  . Generalized anxiety disorder 05/20/2013  . Anxiety and depression 08/13/2012  . Abnormal brain scan 02/07/2012  . Thoracic or lumbosacral neuritis or radiculitis, unspecified 12/22/2010  . Hypercalcemia 11/30/2010  . Colon adenomas 05/11/2009  . HEMORRHOIDS 03/15/2009  . GERD 03/15/2009  . FATTY LIVER DISEASE 03/15/2009  . RENAL CALCULUS 03/15/2009  . Sleep apnea 09/15/2008  . 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 Hyperlipidemia LDL goal <100 09/17/2007  . Hypertension goal BP (blood pressure) < 130/80 09/17/2007    Past Surgical History:  Procedure Laterality Date  . BIOPSY N/A 03/17/2013   Procedure: GASTRIC BIOPSIES;  Surgeon: Danie Binder, MD;  Location: [facility];  Service: Endoscopy;  Laterality: N/A;  . BIOPSY  06/10/2015   Procedure: BIOPSY;  Surgeon: Danie Binder, MD;  Location: [facility];  Service: Endoscopy;;  gastric biopsy  . CARDIAC CATHETERIZATION  05-11-2003  dr Shelva Majestic ([facility])   Abnormal cardiolite/   Normal coronary arteries and normal LVF,  ef  63%  . CARDIOVASCULAR STRESS TEST  01-01-2014   normal lexiscan cardiolite/  no ischemia/ infarct/  normal LV function and wall motion ,  ef 81%  . COLONOSCOPY  last one 10/02/2011   MOD  TICS, IH, NEXT TCS APR 2018  . COLONOSCOPY WITH PROPOFOL N/A 02/26/2017   Procedure: COLONOSCOPY WITH PROPOFOL;  Surgeon: Danie Binder, MD;  Location: [facility];  Service: Endoscopy;  Laterality: N/A;  11:00am  . CYSTO/   RIGHT URETEROSOCOPY LASER LITHOTRIPSY STONE EXTRACTION  10-13-2004  . CYSTO/  RIGHT RETROGRADE PYELOGRAM/  PLACMENT RIGHT URETERAL STENT   01-10-2010  . CYSTOSCOPY W/ URETERAL STENT PLACEMENT Right 08/19/2015   Procedure: CYSTOSCOPY WITH RETROGRADE PYELOGRAM/URETERAL STENT PLACEMENT;  Surgeon: Franchot Gallo, MD;  Location: [facility];  Service: Urology;  Laterality: Right;  . CYSTOSCOPY WITH RETROGRADE PYELOGRAM, URETEROSCOPY AND STENT PLACEMENT Left 10/21/2014   Procedure: CYSTOSCOPY WITH RETROGRADE PYELOGRAM, URETEROSCOPY AND STENT PLACEMENT;  Surgeon: Franchot Gallo, MD;  Location: [facility];  Service: Urology;  Laterality: Left;  . CYSTOSCOPY WITH RETROGRADE PYELOGRAM, URETEROSCOPY AND STENT PLACEMENT Right 11/22/2015   Procedure: CYSTOSCOPY, RIGHT URETERAL STENT REMOVAL; RIGHT RETROGRADE PYELOGRAM, RIGHT URETEROSCOPY WITH BALLOON DILATION; RIGHT URETERAL STENT PLACEMENT;  Surgeon: Franchot Gallo, MD;  Location: [facility];  Service: Urology;  Laterality: Right;  . CYSTOSCOPY/RETROGRADE/URETEROSCOPY/STONE EXTRACTION WITH BASKET Bilateral 08/02/2015   Procedure: CYSTOSCOPY; BILATERAL RETROGRADE PYELOGRAMS; BILATERAL URETEROSCOPY, STONE EXTRACTION WITH BASKET;  Surgeon: Franchot Gallo, MD;  Location: [facility];  Service: Urology;  Laterality: Bilateral;  . CYSTOSCOPY/RETROGRADE/URETEROSCOPY/STONE EXTRACTION WITH BASKET Right 06/28/2015   Procedure: CYSTOSCOPY/RETROGRADE/URETEROSCOPY/STONE EXTRACTION WITH BASKET, RIGHT URETERAL DOUBLE J STENT PLACEMENT;  Surgeon: Franchot Gallo, MD;  Location: [facility];  Service: Urology;  Laterality: Right;  . ESOPHAGOGASTRODUODENOSCOPY (EGD) WITH PROPOFOL N/A 03/17/2013   Procedure: ESOPHAGOGASTRODUODENOSCOPY (EGD) WITH PROPOFOL;  Surgeon: Danie Binder, MD;  Location: [facility];  Service: Endoscopy;  Laterality: N/A;  . ESOPHAGOGASTRODUODENOSCOPY (EGD) WITH PROPOFOL N/A 06/10/2015   Distal gastritis, distal esophageal stricture s/p dilation  . ESOPHAGOGASTRODUODENOSCOPY (EGD) WITH PROPOFOL N/A 02/26/2017   Procedure: ESOPHAGOGASTRODUODENOSCOPY (EGD) WITH PROPOFOL;  Surgeon: Danie Binder, MD;  [facility];  Service: Endoscopy;  Laterality: N/A;  . FLEXIBLE SIGMOIDOSCOPY  09/11/2011   GMW:[id number] Internal hemorrhoids  . HOLMIUM LASER APPLICATION Left 6/44/0347   Procedure: HOLMIUM LASER APPLICATION;  Surgeon: Franchot Gallo, MD;  Location: [facility];  Service: Urology;  Laterality: Left;  . HOLMIUM LASER APPLICATION Right 10/03/9561   Procedure: HOLMIUM LASER APPLICATION;  Surgeon: Franchot Gallo, MD;  Location: [facility];  Service: Urology;  Laterality: Right;  . MASS EXCISION Left 03/04/2015   Procedure: EXCISION OF SOFT TISSUE NEOPLASM LEFT ARM;  Surgeon: Aviva Signs, MD;  Location: [facility];  Service: General;  Laterality: Left;  . PERCUTANEOUS NEPHROSTOLITHOTOMY Bilateral 12/  2011     [facility]  . POLYPECTOMY N/A 03/17/2013   Procedure: GASTRIC POLYPECTOMY;  Surgeon: Danie Binder, MD;  Location: [facility];  Service: Endoscopy;  Laterality: N/A;  . POLYPECTOMY  02/26/2017   Procedure: POLYPECTOMY;  Surgeon: Danie Binder, MD;  [facility];  Service: Endoscopy;;  polyp at cecum, ascending colon polyps x3, hepatic flexure polyps x8, transverse colon polyps x8   . REMOVAL RIGHT THIGH CYST  2006  . SAVORY DILATION N/A 03/17/2013   Procedure: SAVORY DILATION;  Surgeon: Danie Binder, MD;  Location: [facility];  Service: Endoscopy;  Laterality: N/A;  #12.8, 14, 15, 16 dilators  used  . SAVORY DILATION N/A 06/10/2015   Procedure: SAVORY DILATION;  Surgeon: Danie Binder, MD;  [facility];  Service: Endoscopy;  Laterality: N/A;  . TRANSTHORACIC ECHOCARDIOGRAM  01-01-2014   mild LVH/  ef 74-12%/  grade I diastolic dysfunction/  trivial MR, TR, and PR  . VAGINAL HYSTERECTOMY  1970's     OB History    Gravida  2   Para  2   Term  2   Preterm      AB      Living  2     SAB      TAB      Ectopic      Multiple      Live Births               Home Medications    Prior to Admission medications   Medication  Sig Start Date End Date Taking? Authorizing Provider  ALPRAZolam Duanne Moron) 0.5 MG tablet Take 1 tablet (0.5 mg total) by mouth at bedtime. Take one tablet by mouth at bedtime for anxiety 03/24/18 06/24/18  [name], MD  amLODipine (NORVASC) 10 MG tablet TAKE 1 TABLET BY MOUTH EVERY DAY Patient taking differently: Take 10 mg by mouth daily.  12/06/17 03/14/18  [name], MD  aspirin EC 325 MG tablet Take 325 mg by mouth daily.    [provider]  budesonide-formoterol (SYMBICORT) 80-4.5 MCG/ACT inhaler Inhale 2 puffs into the lungs 2 (two) times daily. 02/28/18   [name], MD  ciprofloxacin (CIPRO) 500 MG tablet Take 1 tablet (500 mg total) by mouth 2 (two) times daily. One po bid x 7 days 03/11/18   Daleen Bo, MD  diphenhydramine-acetaminophen (TYLENOL PM) 25-500 MG TABS tablet Take 2 tablets by mouth at bedtime.    [provider]  docusate sodium (COLACE) 100 MG capsule Take 200 mg by mouth at bedtime.     [provider]  iron polysaccharides (NIFEREX) 150 MG capsule Take 150 mg by mouth daily.    [provider]  KLOR-CON M20 20 MEQ tablet TAKE 1 TABLET BY MOUTH EVERY DAY 03/12/18   Arnoldo Lenis, MD  levofloxacin (LEVAQUIN) 500 MG tablet Take 1 tablet (500 mg total) by mouth daily. 05/06/18   Julianne Rice, MD  lovastatin (MEVACOR) 40 MG tablet TAKE 2 TABLETS (80 MG TOTAL) BY MOUTH AT BEDTIME. Patient taking differently: Take 40 mg by mouth at bedtime.  01/13/18   [name], MD  OXYGEN Inhale into the lungs daily. Bedtime    [provider]  pantoprazole (PROTONIX) 40 MG tablet TAKE 1 TABLET (40 MG TOTAL) BY MOUTH 2 (TWO) TIMES DAILY. 02/24/18   [name], MD  PARoxetine (PAXIL) 40 MG tablet TAKE 1 TABLET BY MOUTH EVERY DAY IN THE MORNING 04/10/18   [name], MD  potassium chloride (K-DUR) 10 MEQ tablet Take 1 tablet (10 mEq total) by mouth daily. 05/06/18   Julianne Rice, MD    tiZANidine (ZANAFLEX) 2 MG tablet TAKE 1 TABLET BY MOUTH TWICE A DAY AS NEEDED FOR MUSCLE SPASM 04/07/18   [name], MD  TRADJENTA 5 MG TABS tablet TAKE 1 TABLET BY MOUTH EVERY DAY Patient taking differently: Take 5 mg by mouth daily.  02/26/18   [name], MD  traZODone (DESYREL) 100 MG tablet TAKE 1 TABLET BY MOUTH EVERYDAY AT BEDTIME 03/15/18   [name], MD    Family History  Family History  Problem Relation Age of Onset  . Hypertension Mother   . Diabetes Mother   . Heart failure Mother   . Dementia Mother   . Emphysema Father   . Hypertension Father   . Diabetes Brother   . GER disease Brother   . Hypertension Sister   . Hypertension Sister   . Cancer Unknown        family history   . Diabetes Unknown        family history   . Heart defect Unknown        famiily history   . Arthritis Unknown        family history   . Anesthesia problems Neg Hx   . Hypotension Neg Hx   . Malignant hyperthermia Neg Hx   . Pseudochol deficiency Neg Hx   . Colon cancer Neg Hx     Social History Social History   Tobacco Use  . Smoking status: Former Smoker    Packs/day: 1.00    Years: 20.00    Pack years: 20.00    Types: Cigarettes    Start date: 09/21/1974    Last attempt to quit: 09/20/1988    Years since quitting: 29.6  . Smokeless tobacco: Never Used  . Tobacco comment: Quit x 20 years  Substance Use Topics  . Alcohol use: No    Alcohol/week: 0.0 standard drinks  . Drug use: No     Allergies   Ace inhibitors; Keflex [cephalexin]; Nitrofurantoin; and Penicillins   Review of Systems Review of Systems  Constitutional: Negative for chills and fever.  Respiratory: Negative for shortness of breath.   Cardiovascular: Negative for chest pain.  Gastrointestinal: Positive for abdominal distention and abdominal pain. Negative for blood in stool, diarrhea, nausea and vomiting.  Genitourinary: Negative for dysuria, flank pain and frequency.   Musculoskeletal: Negative for back pain, myalgias and neck pain.  Skin: Negative for rash and wound.  Neurological: Negative for dizziness, weakness, light-headedness, numbness and headaches.  All other systems reviewed and are negative.    Physical Exam Updated Vital Signs BP 125/77   Pulse 97   Temp 97.7 F (36.5 C) (Oral)   Resp 18   Ht _0  (1.676 m)   Wt 95.3 kg   SpO2 93%   BMI 33.89 kg/m   Physical Exam  Constitutional: She is oriented to person, place, and time. She appears well-developed and well-nourished.  HENT:  Head: Normocephalic and atraumatic.  Mouth/Throat: Oropharynx is clear and moist. No oropharyngeal exudate.  Eyes: Pupils are equal, round, and reactive to light. EOM are normal.  Neck: Normal range of motion. Neck supple.  Cardiovascular: Normal rate and regular rhythm.  Pulmonary/Chest: Effort normal and breath sounds normal. No stridor. No respiratory distress. She has no wheezes. She has no rales. She exhibits no tenderness.  Abdominal: Soft. Bowel sounds are normal. She exhibits distension. There is no tenderness. There is no rebound and no guarding.  Very mild abdominal distention.  Musculoskeletal: Normal range of motion. She exhibits no edema or tenderness.  No midline thoracic or lumbar tenderness.  No CVA tenderness.  Distal pulses intact.  Neurological: She is alert and oriented to person, place, and time.  Skin: Skin is warm and dry. No rash noted. No erythema.  Psychiatric: Her behavior is normal.  Anxious  Nursing note and vitals reviewed.    ED Treatments / Results  Labs (all labs ordered are listed, but only abnormal results are displayed) Labs Reviewed  CBC WITH DIFFERENTIAL/PLATELET - Abnormal; Notable for the following components:      Result Value   MCV 101.7 (*)    All other components within normal limits  COMPREHENSIVE METABOLIC PANEL - Abnormal; Notable for the following components:   Sodium 134 (*)    Potassium 3.1 (*)     Glucose, Bld 111 (*)    Creatinine, Ser 1.73 (*)    Total Protein 8.7 (*)    GFR calc non Af Amer 29 (*)    GFR calc Af Amer 33 (*)    All other components within normal limits  URINALYSIS, ROUTINE W REFLEX MICROSCOPIC - Abnormal; Notable for the following components:   APPearance HAZY (*)    Hgb urine dipstick SMALL (*)    Leukocytes, UA SMALL (*)    All other components within normal limits  LIPASE, BLOOD    EKG None  Radiology Dg Abd Acute W/chest  Result Date: 05/06/2018 CLINICAL DATA:  Abdominal pain and distention. EXAM: DG ABDOMEN ACUTE W/ 1V CHEST COMPARISON:  None. FINDINGS: There is no evidence of dilated bowel loops or free intraperitoneal air. Left renal calculi are noted. Heart size and mediastinal contours are within normal limits. Both lungs are clear. IMPRESSION: No evidence of bowel obstruction or ileus. Left nephrolithiasis. No acute cardiopulmonary abnormality seen. Electronically Signed   By: Marijo Conception, M.D.   On: 05/06/2018 16:54    Procedures Procedures (including critical care time)  Medications Ordered in ED Medications  potassium chloride SA (K-DUR,KLOR-CON) CR tablet 40 mEq (40 mEq Oral Given 05/06/18 1846)     Initial Impression / Assessment and Plan / ED Course  I have reviewed the triage vital signs and the nursing notes.  Pertinent labs & imaging results that were available during my care of the patient were reviewed by me and considered in my medical decision making (see chart for details).     Abdominal x-rays without acute findings.  No evidence of obstruction.  Mild hypokalemia.  Patient also has questionable urinary tract infection.  Has follow-up with gastroenterology.  Strict return precautions have been given.  Final Clinical Impressions(s) / ED Diagnoses   Final diagnoses:  Abdominal distention  Hypokalemia  Acute lower UTI    ED Discharge Orders         Ordered    levofloxacin (LEVAQUIN) 500 MG tablet  Daily      05/06/18 2006    potassium chloride (K-DUR) 10 MEQ tablet  Daily     05/06/18 2006           Julianne Rice, MD 05/06/18 2014

## 2018-05-13 ENCOUNTER — Other Ambulatory Visit: Payer: Self-pay | Admitting: Cardiology

## 2018-05-15 LAB — HM DIABETES EYE EXAM

## 2018-05-19 ENCOUNTER — Ambulatory Visit (INDEPENDENT_AMBULATORY_CARE_PROVIDER_SITE_OTHER): Payer: Medicare Other | Admitting: Family Medicine

## 2018-05-19 ENCOUNTER — Encounter: Payer: Self-pay | Admitting: Family Medicine

## 2018-05-19 VITALS — BP 108/80 | HR 90 | Resp 12 | Ht 66.0 in | Wt 208.0 lb

## 2018-05-19 DIAGNOSIS — R0902 Hypoxemia: Secondary | ICD-10-CM | POA: Diagnosis not present

## 2018-05-19 DIAGNOSIS — E785 Hyperlipidemia, unspecified: Secondary | ICD-10-CM

## 2018-05-19 DIAGNOSIS — F411 Generalized anxiety disorder: Secondary | ICD-10-CM

## 2018-05-19 DIAGNOSIS — I10 Essential (primary) hypertension: Secondary | ICD-10-CM | POA: Diagnosis not present

## 2018-05-19 DIAGNOSIS — E669 Obesity, unspecified: Secondary | ICD-10-CM

## 2018-05-19 DIAGNOSIS — R252 Cramp and spasm: Secondary | ICD-10-CM | POA: Diagnosis not present

## 2018-05-19 DIAGNOSIS — E1121 Type 2 diabetes mellitus with diabetic nephropathy: Secondary | ICD-10-CM

## 2018-05-19 MED ORDER — ALPRAZOLAM 0.5 MG PO TABS: 0.5000 mg | ORAL_TABLET | Freq: Every day | ORAL | 5 refills | Status: DC

## 2018-05-19 NOTE — Patient Instructions (Addendum)
F/u in 4.5 months, call if you need me before  You will get appointment for Dr Aline Brochure to evaluate your left shoulder when you are checking out  Your foot exam, blood pressure and general exam are all good  You need fasting lipid, cmp and EGFR, hBA1C, magnesium this week please

## 2018-05-19 NOTE — Progress Notes (Signed)
Cheryl Abbott     MRN: [id number]      DOB: 1945/08/01   HPI Cheryl Abbott is here for follow up and re-evaluation of chronic medical conditions, medication management and review of any available recent lab and radiology data.  Preventive health is updated, specifically  Cancer screening and Immunization.   Questions or concerns regarding consultations or procedures which the PT has had in the interim are  addressed. The PT denies any adverse reactions to current medications since the last visit.   ROS Denies recent fever or chills. Denies sinus pressure, nasal congestion, ear pain or sore throat. Denies chest congestion, productive cough or wheezing. Denies chest pains, palpitations and leg swelling Denies abdominal pain, nausea, vomiting,diarrhea or constipation.   Denies dysuria, frequency, hesitancy or incontinence.  Denies headaches, seizures, numbness, or tingling. Denies depression, anxiety or insomnia. Denies skin break down or rash.   PE  BP 100/70   Pulse 90   Resp 12   Ht 5\' 6"  (1.676 m)   Wt 208 lb (94.3 kg)   SpO2 95% Comment: room air  BMI 33.57 kg/m   Patient alert and oriented and in no cardiopulmonary distress.  HEENT: No facial asymmetry, EOMI,   oropharynx pink and moist.  Neck supple no JVD, no mass.  Chest: Clear to auscultation bilaterally.  CVS: S1, S2 no murmurs, no S3.Regular rate.  ABD: Soft non tender.   Ext: No edema  MS: Adequate ROM spine, decreased in left shouder   Skin: Intact, no ulcerations or rash noted.  Psych: Good eye contact, normal affect. Memory intact not anxious or depressed appearing.  CNS: CN 2-12 intact, power,  normal throughout.no focal deficits noted.   Assessment & Plan  Hypertension goal BP (blood pressure) < 130/80 Controlled, no change in medication DASH diet and commitment to daily physical activity for a minimum of 30 minutes discussed and encouraged, as a part of hypertension management. The  importance of attaining a healthy weight is also discussed.  BP/Weight 05/19/2018 05/06/2018 03/24/2018 03/20/2018 03/18/2018 03/14/2018 53/02/7672  Systolic BP 419 379 024 097 353 299 242  Diastolic BP 80 77 64 69 81 78 63  Wt. (Lbs) 208 210 210 209 208.2 208 209  BMI 33.57 33.89 33.89 33.73 33.6 33.57 33.73       Hyperlipidemia LDL goal <100 Hyperlipidemia:Low fat diet discussed and encouraged. Needs to lower fat intake and increase xercise   Lipid Panel  Lab Results  Component Value Date   CHOL 172 05/21/2018   HDL 47 (L) 05/21/2018   LDLCALC 103 (H) 05/21/2018   TRIG 120 05/21/2018   CHOLHDL 3.7 05/21/2018      no change in medication   Type 2 diabetes with nephropathy ([place]) Cheryl Abbott is reminded of the importance of commitment to daily physical activity for 30 minutes or more, as able and the need to limit carbohydrate intake to 30 to 60 grams per meal to help with blood sugar control.   The need to take medication as prescribed, test blood sugar as directed, and to call between visits if there is a concern that blood sugar is uncontrolled is also discussed.   Cheryl Abbott is reminded of the importance of daily foot exam, annual eye examination, and good blood sugar, blood pressure and cholesterol control.  Diabetic Labs Latest Ref Rng & Units 05/21/2018 05/06/2018 03/11/2018 12/27/2017 12/24/2017  HbA1c <5.7 % of total Hgb 6.9(H) - - - 7.3(H)  Microalbumin mg/dL - - - - -  Micro/Creat Ratio 0.0 - 30.0 mg/g - - - - -  Chol <200 mg/dL 172 - - - 164  HDL >50 mg/dL 47(L) - - - 43(L)  Calc LDL mg/dL (calc) 103(H) - - - 94  Triglycerides <150 mg/dL 120 - - - 161(H)  Creatinine 0.60 - 0.93 mg/dL 1.72(H) 1.73(H) 1.35(H) 1.70(H) -   BP/Weight 05/19/2018 05/06/2018 03/24/2018 03/20/2018 03/18/2018 03/14/2018 70/02/6437  Systolic BP 381 840 375 436 067 703 403  Diastolic BP 80 77 64 69 81 78 63  Wt. (Lbs) 208 210 210 209 208.2 208 209  BMI 33.57 33.89 33.89 33.73 33.6 33.57 33.73    Foot/eye exam completion dates Latest Ref Rng & Units 05/19/2018 05/15/2018  Eye Exam No Retinopathy - No Retinopathy  Foot exam Order - - -  Foot Form Completion - Done -         Generalized anxiety disorder Controlled, no change in medication   Obesity (BMI 30.0-34.9) Unchanged Patient re-educated about  the importance of commitment to a  minimum of 150 minutes of exercise per week.  The importance of healthy food choices with portion control discussed. Encouraged to start a food diary, count calories and to consider  joining a support group. Sample diet sheets offered. Goals set by the patient for the next several months.   Weight /BMI 05/19/2018 05/06/2018 03/24/2018  WEIGHT 208 lb 210 lb 210 lb  HEIGHT 5\' 6"  5\' 6"  5\' 6"   BMI 33.57 kg/m2 33.89 kg/m2 33.89 kg/m2

## 2018-05-21 DIAGNOSIS — I10 Essential (primary) hypertension: Secondary | ICD-10-CM | POA: Diagnosis not present

## 2018-05-21 DIAGNOSIS — E1121 Type 2 diabetes mellitus with diabetic nephropathy: Secondary | ICD-10-CM | POA: Diagnosis not present

## 2018-05-21 DIAGNOSIS — E785 Hyperlipidemia, unspecified: Secondary | ICD-10-CM | POA: Diagnosis not present

## 2018-05-21 DIAGNOSIS — R252 Cramp and spasm: Secondary | ICD-10-CM | POA: Diagnosis not present

## 2018-05-22 LAB — HEMOGLOBIN A1C
Hgb A1c MFr Bld: 6.9 % of total Hgb — ABNORMAL HIGH (ref <5.7)
Mean Plasma Glucose: 151 (calc)
eAG (mmol/L): 8.4 (calc)

## 2018-05-22 LAB — LIPID PANEL
Cholesterol: 172 mg/dL (ref <200)
HDL: 47 mg/dL — ABNORMAL LOW (ref >50)
LDL Cholesterol (Calc): 103 mg/dL (calc) — ABNORMAL HIGH
Non-HDL Cholesterol (Calc): 125 mg/dL (calc) (ref <130)
Total CHOL/HDL Ratio: 3.7 (calc) (ref <5.0)
Triglycerides: 120 mg/dL (ref <150)

## 2018-05-22 LAB — MAGNESIUM: Magnesium: 2.3 mg/dL (ref 1.5–2.5)

## 2018-05-22 LAB — COMPLETE METABOLIC PANEL WITH GFR
AG Ratio: 1.1 (calc) (ref 1.0–2.5)
ALT: 24 U/L (ref 6–29)
AST: 24 U/L (ref 10–35)
Albumin: 4.6 g/dL (ref 3.6–5.1)
Alkaline phosphatase (APISO): 84 U/L (ref 33–130)
BUN/Creatinine Ratio: 10 (calc) (ref 6–22)
BUN: 18 mg/dL (ref 7–25)
CO2: 25 mmol/L (ref 20–32)
Calcium: 10.3 mg/dL (ref 8.6–10.4)
Chloride: 104 mmol/L (ref 98–110)
Creat: 1.72 mg/dL — ABNORMAL HIGH (ref 0.60–0.93)
GFR, Est African American: 34 mL/min/{1.73_m2} — ABNORMAL LOW (ref >=60)
GFR, Est Non African American: 29 mL/min/{1.73_m2} — ABNORMAL LOW (ref >=60)
Globulin: 4.3 g/dL (calc) — ABNORMAL HIGH (ref 1.9–3.7)
Glucose, Bld: 109 mg/dL — ABNORMAL HIGH (ref 65–99)
Potassium: 5 mmol/L (ref 3.5–5.3)
Sodium: 138 mmol/L (ref 135–146)
Total Bilirubin: 0.3 mg/dL (ref 0.2–1.2)
Total Protein: 8.9 g/dL — ABNORMAL HIGH (ref 6.1–8.1)

## 2018-05-29 ENCOUNTER — Other Ambulatory Visit: Payer: Self-pay | Admitting: Family Medicine

## 2018-05-31 ENCOUNTER — Encounter: Payer: Self-pay | Admitting: Family Medicine

## 2018-05-31 NOTE — Assessment & Plan Note (Signed)
Controlled, no change in medication DASH diet and commitment to daily physical activity for a minimum of 30 minutes discussed and encouraged, as a part of hypertension management. The importance of attaining a healthy weight is also discussed.  BP/Weight 05/19/2018 05/06/2018 03/24/2018 03/20/2018 03/18/2018 03/14/2018 92/11/5995  Systolic BP 877 654 868 852 074 097 964  Diastolic BP 80 77 64 69 81 78 63  Wt. (Lbs) 208 210 210 209 208.2 208 209  BMI 33.57 33.89 33.89 33.73 33.6 33.57 33.73

## 2018-05-31 NOTE — Assessment & Plan Note (Signed)
Controlled, no change in medication  

## 2018-05-31 NOTE — Assessment & Plan Note (Addendum)
Hyperlipidemia:Low fat diet discussed and encouraged. Needs to lower fat intake and increase xercise   Lipid Panel  Lab Results  Component Value Date   CHOL 172 05/21/2018   HDL 47 (L) 05/21/2018   LDLCALC 103 (H) 05/21/2018   TRIG 120 05/21/2018   CHOLHDL 3.7 05/21/2018      no change in medication

## 2018-05-31 NOTE — Assessment & Plan Note (Signed)
Unchanged Patient re-educated about  the importance of commitment to a  minimum of 150 minutes of exercise per week.  The importance of healthy food choices with portion control discussed. Encouraged to start a food diary, count calories and to consider  joining a support group. Sample diet sheets offered. Goals set by the patient for the next several months.   Weight /BMI 05/19/2018 05/06/2018 03/24/2018  WEIGHT 208 lb 210 lb 210 lb  HEIGHT 5\' 6"  5\' 6"  5\' 6"   BMI 33.57 kg/m2 33.89 kg/m2 33.89 kg/m2

## 2018-05-31 NOTE — Assessment & Plan Note (Signed)
Cheryl Abbott is reminded of the importance of commitment to daily physical activity for 30 minutes or more, as able and the need to limit carbohydrate intake to 30 to 60 grams per meal to help with blood sugar control.   The need to take medication as prescribed, test blood sugar as directed, and to call between visits if there is a concern that blood sugar is uncontrolled is also discussed.   Cheryl Abbott is reminded of the importance of daily foot exam, annual eye examination, and good blood sugar, blood pressure and cholesterol control.  Diabetic Labs Latest Ref Rng & Units 05/21/2018 05/06/2018 03/11/2018 12/27/2017 12/24/2017  HbA1c <5.7 % of total Hgb 6.9(H) - - - 7.3(H)  Microalbumin mg/dL - - - - -  Micro/Creat Ratio 0.0 - 30.0 mg/g - - - - -  Chol <200 mg/dL 172 - - - 164  HDL >50 mg/dL 47(L) - - - 43(L)  Calc LDL mg/dL (calc) 103(H) - - - 94  Triglycerides <150 mg/dL 120 - - - 161(H)  Creatinine 0.60 - 0.93 mg/dL 1.72(H) 1.73(H) 1.35(H) 1.70(H) -   BP/Weight 05/19/2018 05/06/2018 03/24/2018 03/20/2018 03/18/2018 03/14/2018 49/07/98  Systolic BP 712 197 588 325 498 264 158  Diastolic BP 80 77 64 69 81 78 63  Wt. (Lbs) 208 210 210 209 208.2 208 209  BMI 33.57 33.89 33.89 33.73 33.6 33.57 33.73   Foot/eye exam completion dates Latest Ref Rng & Units 05/19/2018 05/15/2018  Eye Exam No Retinopathy - No Retinopathy  Foot exam Order - - -  Foot Form Completion - Done -

## 2018-06-02 ENCOUNTER — Ambulatory Visit (INDEPENDENT_AMBULATORY_CARE_PROVIDER_SITE_OTHER): Payer: Medicare Other | Admitting: Orthopedic Surgery

## 2018-06-02 ENCOUNTER — Encounter: Payer: Self-pay | Admitting: Orthopedic Surgery

## 2018-06-02 ENCOUNTER — Ambulatory Visit (INDEPENDENT_AMBULATORY_CARE_PROVIDER_SITE_OTHER): Payer: Medicare Other

## 2018-06-02 VITALS — BP 126/76 | HR 96 | Ht 66.5 in | Wt 209.0 lb

## 2018-06-02 DIAGNOSIS — M7552 Bursitis of left shoulder: Secondary | ICD-10-CM

## 2018-06-02 DIAGNOSIS — M25511 Pain in right shoulder: Secondary | ICD-10-CM

## 2018-06-02 DIAGNOSIS — M7551 Bursitis of right shoulder: Secondary | ICD-10-CM

## 2018-06-02 DIAGNOSIS — M25512 Pain in left shoulder: Secondary | ICD-10-CM | POA: Diagnosis not present

## 2018-06-02 NOTE — Patient Instructions (Signed)
Bursitis    Bursitis is inflammation and irritation of a bursa, which is one of the small, fluid-filled sacs that cushion and protect the moving parts of your body. These sacs are located between bones and muscles, bones and muscle attachments, or bones and skin areas that are next to bones. A bursa protects those structures from the wear and tear that results from frequent movement.  An inflamed bursa causes pain and swelling. Fluid may build up inside the sac. Bursitis is most common near joints, especially the knees, elbows, hips, and shoulders.  What are the causes?  This condition may be caused by:   Injury from:  ? A direct hit (blow), like falling on your knee or elbow.  ? Overuse of a joint (repetitive stress).   Infection. This can happen if bacteria get into a bursa through a cut or scrape near a joint.   Diseases that cause joint inflammation, such as gout and rheumatoid arthritis.  What increases the risk?  You are more likely to develop this condition if you:   Have a job or hobby that involves a lot of repetitive stress on your joints.   Have a condition that weakens your body's defense system (immune system), such as diabetes, cancer, or HIV.   Do any of these often:  ? Lift and reach overhead.  ? Kneel or lean on hard surfaces.  ? Run or walk.  What are the signs or symptoms?  The most common symptoms of this condition include:   Pain that gets worse when you move the affected body part or use it to support (bear) your body weight.   Inflammation.   Stiffness.  Other symptoms include:   Redness.   Swelling.   Tenderness.   Warmth.   Pain that continues after rest.   Fever or chills. These may occur in bursitis that is caused by infection.  How is this diagnosed?  This condition may be diagnosed based on:   Your medical history and a physical exam.   Imaging tests, such as an MRI.   A procedure to drain fluid from the bursa with a needle (aspiration). The fluid may be checked for  signs of infection or gout.   Blood tests to rule out other causes of inflammation.  How is this treated?  This condition can usually be treated at home with rest, ice, applying pressure (compression), and raising the body part that is affected (elevation). This is called RICE therapy. For mild bursitis, RICE therapy may be all you need. Other treatments may include:   NSAIDs to treat pain and inflammation.   Corticosteroid medicines to fight inflammation. These medicines may be injected into and around the area of bursitis.   Aspiration of fluid from the bursa to relieve pain and improve movement.   Antibiotic medicine to treat an infected bursa.   A splint, brace, or walking aid, such as a cane.   Physical therapy if you continue to have pain or limited movement.   Surgery to remove a damaged or infected bursa. This may be needed if other treatments have not worked.  Follow these instructions at home:  Medicines   Take over-the-counter and prescription medicines only as told by your health care provider.   If you were prescribed an antibiotic medicine, take it as told by your health care provider. Do not stop taking the antibiotic even if you start to feel better.  General instructions     Rest the affected area   as told by your health care provider.  ? If possible, raise (elevate) the affected area above the level of your heart while you are sitting or lying down.  ? Avoid activities that make pain worse.   Use splints, braces, pads, or walking aids as told by your health care provider.   If directed, put ice on the affected area:  ? If you have a removable splint or brace, remove it as told by your health care provider.  ? Put ice in a plastic bag.  ? Place a towel between your skin and the bag or between your splint or brace and the bag.  ? Leave the ice on for 20 minutes, 2-3 times a day.   Keep all follow-up visits as told by your health care provider. This is important.  Preventing future  episodes  Take actions to help prevent future episodes of bursitis.   Wear knee pads if you kneel often.   Wear sturdy running or walking shoes that fit you well.   Take breaks regularly from repetitive activity.   Warm up by stretching before doing any activity that takes a lot of effort.   Maintain a healthy weight or lose weight as recommended by your health care provider. If you need help doing this, ask your health care provider.   Exercise regularly. Start any new physical activity gradually.  Contact a health care provider if you:   Have a fever.   Have chills.   Have bursitis that is not getting better with treatment or home care.  Summary   Bursitis is inflammation and irritation of a bursa, which is one of the small, fluid-filled sacs that cushion and protect the moving parts of your body.   An inflamed bursa causes pain and swelling.   Bursitis is commonly diagnosed with a physical exam, but other tests are sometimes needed.   This condition can usually be treated at home with rest, ice, applying pressure (compression), and raising the body part that is affected (elevation). This is called RICE therapy.  This information is not intended to replace advice given to you by your health care provider. Make sure you discuss any questions you have with your health care provider.  Document Released: 05/25/2000 Document Revised: 04/12/2017 Document Reviewed: 04/12/2017  Elsevier Interactive Patient Education  2019 [employer]

## 2018-06-02 NOTE — Progress Notes (Signed)
Patient ID: Cheryl Abbott, female   DOB: 07-26-1945, 72 y.o.   MRN: [id number]  Chief Complaint  Patient presents with  . Shoulder Pain    Left worse than right    HPI Cheryl Abbott is a 72 y.o. female.  72 year old female presents for evaluation of bilateral shoulder pain  Location both shoulders duration since November 2019 quality dull ache severity 8 out of 10 associated with decreased range of motion trouble sleeping difficulties with activities of daily living  Pain started in her left shoulder now includes the right shoulder is hard for her to sleep she has had no trauma she has not increased her activity in any way.  She did try some topical medications salon pause patch Tylenol and ibuprofen without relief  Review of Systems Review of Systems  Constitutional: Positive for malaise/fatigue.  Musculoskeletal: Positive for back pain and neck pain.  Skin:       Complains of knots shoulder and arm  Neurological: Negative for tingling.    Past Medical History:  Diagnosis Date  . Anemia   . Anxiety   . Anxiety disorder   . Arthritis   . Cancer ([place])    acute myeloma  . 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 Chronic back pain   . Depression   . Fatty liver   . GERD (gastroesophageal reflux disease)   . Headache   . History of adenomatous polyp of colon    2008  . History of kidney stones   . Hypercalcemia   . Hyperlipidemia   . Hypertension   . Left ureteral stone   . Lipoma of arm 01/19/2015  . Lumbar disc disease with radiculopathy   . Nephrolithiasis   . Neuropathy, peripheral   . OSA treated with BiPAP    per study 2007  . Sciatica   . Sigmoid diverticulosis   . Type 2 diabetes mellitus ([place])     Past Surgical History:  Procedure Laterality Date  . BIOPSY N/A 03/17/2013   Procedure: GASTRIC BIOPSIES;  Surgeon: Danie Binder, MD;  Location: [facility];  Service: Endoscopy;  Laterality: N/A;  . BIOPSY  06/10/2015   Procedure: BIOPSY;  Surgeon:  Danie Binder, MD;  [facility];  Service: Endoscopy;;  gastric biopsy  . CARDIAC CATHETERIZATION  05-11-2003  dr Shelva Majestic ([facility])   Abnormal cardiolite/   Normal coronary arteries and normal LVF,  ef  63%  . CARDIOVASCULAR STRESS TEST  01-01-2014   normal lexiscan cardiolite/  no ischemia/ infarct/  normal LV function and wall motion ,  ef 81%  . COLONOSCOPY  last one 10/02/2011   [facility] [place] [facility], IH, NEXT TCS APR 2018  . COLONOSCOPY WITH PROPOFOL N/A 02/26/2017   Procedure: COLONOSCOPY WITH PROPOFOL;  Surgeon: Danie Binder, MD;  [facility];  Service: Endoscopy;  Laterality: N/A;  11:00am  . CYSTO/   RIGHT URETEROSOCOPY LASER LITHOTRIPSY STONE EXTRACTION  10-13-2004  . CYSTO/  RIGHT RETROGRADE PYELOGRAM/  PLACMENT RIGHT URETERAL STENT  01-10-2010  . CYSTOSCOPY W/ URETERAL STENT PLACEMENT Right 08/19/2015   Procedure: CYSTOSCOPY WITH RETROGRADE PYELOGRAM/URETERAL STENT PLACEMENT;  Surgeon: Franchot Gallo, MD;  Location: [facility];  Service: Urology;  Laterality: Right;  . CYSTOSCOPY WITH RETROGRADE PYELOGRAM, URETEROSCOPY AND STENT PLACEMENT Left 10/21/2014   Procedure: CYSTOSCOPY WITH RETROGRADE PYELOGRAM, URETEROSCOPY AND STENT PLACEMENT;  Surgeon: Franchot Gallo, MD;  Location[facility];  Service: Urology;  Laterality: Left;  .  CYSTOSCOPY WITH RETROGRADE PYELOGRAM, URETEROSCOPY AND STENT PLACEMENT Right 11/22/2015   Procedure: CYSTOSCOPY, RIGHT URETERAL STENT REMOVAL; RIGHT RETROGRADE PYELOGRAM, RIGHT URETEROSCOPY WITH BALLOON DILATION; RIGHT URETERAL STENT PLACEMENT;  Surgeon: Franchot Gallo, MD;  Location: [facility];  Service: Urology;  Laterality: Right;  . CYSTOSCOPY/RETROGRADE/URETEROSCOPY/STONE EXTRACTION WITH BASKET Bilateral 08/02/2015   Procedure: CYSTOSCOPY; BILATERAL RETROGRADE PYELOGRAMS; BILATERAL URETEROSCOPY, STONE EXTRACTION WITH BASKET;  Surgeon: Franchot Gallo, MD;  Location: [facility];  Service: Urology;   Laterality: Bilateral;  . CYSTOSCOPY/RETROGRADE/URETEROSCOPY/STONE EXTRACTION WITH BASKET Right 06/28/2015   Procedure: CYSTOSCOPY/RETROGRADE/URETEROSCOPY/STONE EXTRACTION WITH BASKET, RIGHT URETERAL DOUBLE J STENT PLACEMENT;  Surgeon: Franchot Gallo, MD;  [facility]: [facility];  Service: Urology;  Laterality: Right;  . ESOPHAGOGASTRODUODENOSCOPY (EGD) WITH PROPOFOL N/A 03/17/2013   Procedure: ESOPHAGOGASTRODUODENOSCOPY (EGD) WITH PROPOFOL;  Surgeon: Danie Binder, MD;  [facility];  Service: Endoscopy;  Laterality: N/A;  . ESOPHAGOGASTRODUODENOSCOPY (EGD) WITH PROPOFOL N/A 06/10/2015   Distal gastritis, distal esophageal stricture s/p dilation  . ESOPHAGOGASTRODUODENOSCOPY (EGD) WITH PROPOFOL N/A 02/26/2017   Procedure: ESOPHAGOGASTRODUODENOSCOPY (EGD) WITH PROPOFOL;  Surgeon: Danie Binder, MD;  [facility];  Service: Endoscopy;  Laterality: N/A;  . FLEXIBLE SIGMOIDOSCOPY  09/11/2011   [id number] Internal hemorrhoids  . HOLMIUM LASER APPLICATION Left 9/81/1914   Procedure: HOLMIUM LASER APPLICATION;  Surgeon: Franchot Gallo, MD;  Location: [facility];  Service: Urology;  Laterality: Left;  . HOLMIUM LASER APPLICATION Right 7/82/9562   Procedure: HOLMIUM LASER APPLICATION;  Surgeon: Franchot Gallo, MD;  [facility]: [facility];  Service: Urology;  Laterality: Right;  . MASS EXCISION Left 03/04/2015   Procedure: EXCISION OF SOFT TISSUE NEOPLASM LEFT ARM;  Surgeon: Aviva Signs, MD;  Location: [facility];  Service: General;  Laterality: Left;  . PERCUTANEOUS NEPHROSTOLITHOTOMY Bilateral 12/  2011     [facility]  . POLYPECTOMY N/A 03/17/2013   Procedure: GASTRIC POLYPECTOMY;  Surgeon: Danie Binder, MD;  Location: [facility];  Service: Endoscopy;  Laterality: N/A;  . POLYPECTOMY  02/26/2017   Procedure: POLYPECTOMY;  Surgeon: Danie Binder, MD;  [facility];  Service: Endoscopy;;  polyp at cecum, ascending colon polyps x3, hepatic flexure polyps x8, transverse colon  polyps x8   . REMOVAL RIGHT THIGH CYST  2006  . SAVORY DILATION N/A 03/17/2013   Procedure: SAVORY DILATION;  Surgeon: Danie Binder, MD;  Location: [facility];  Service: Endoscopy;  Laterality: N/A;  #12.8, 14, 15, 16 dilators used  . SAVORY DILATION N/A 06/10/2015   Procedure: SAVORY DILATION;  Surgeon: Danie Binder, MD;  [facility];  Service: Endoscopy;  Laterality: N/A;  . TRANSTHORACIC ECHOCARDIOGRAM  01-01-2014   mild LVH/  ef 13-08%/  grade I diastolic dysfunction/  trivial MR, TR, and PR  . VAGINAL HYSTERECTOMY  1970's    Family History  Problem Relation Age of Onset  . Hypertension Mother   . Diabetes Mother   . Heart failure Mother   . Dementia Mother   . Emphysema Father   . Hypertension Father   . Diabetes Brother   . GER disease Brother   . Hypertension Sister   . Hypertension Sister   . Cancer Unknown        family history   . Diabetes Unknown        family history   . Heart defect Unknown        famiily history   . Arthritis Unknown        family history   .  Anesthesia problems Neg Hx   . Hypotension Neg Hx   . Malignant hyperthermia Neg Hx   . Pseudochol deficiency Neg Hx   . Colon cancer Neg Hx      Social History   Tobacco Use  . Smoking status: Former Smoker    Packs/day: 1.00    Years: 20.00    Pack years: 20.00    Types: Cigarettes    Start date: 09/21/1974    Last attempt to quit: 09/20/1988    Years since quitting: 29.7  . Smokeless tobacco: Never Used  . Tobacco comment: Quit x 20 years  Substance Use Topics  . Alcohol use: No    Alcohol/week: 0.0 standard drinks  . Drug use: No    Allergies  Allergen Reactions  . Ace Inhibitors Cough  . Keflex [Cephalexin] Diarrhea and Nausea And Vomiting  . Nitrofurantoin Diarrhea and Nausea And Vomiting  . Penicillins Hives and Other (See Comments)    Reaction:  Blisters on hands/feet  Has patient had a PCN reaction causing immediate rash, facial/tongue/throat swelling, SOB or  lightheadedness with hypotension: No Has patient had a PCN reaction causing severe rash involving mucus membranes or skin necrosis: No Has patient had a PCN reaction that required hospitalization No Has patient had a PCN reaction occurring within the last 10 years: No If all of the above answers are "NO", then may proceed with Cephalosporin use.    Allergies  Allergen Reactions  . Ace Inhibitors Cough  . Keflex [Cephalexin] Diarrhea and Nausea And Vomiting  . Nitrofurantoin Diarrhea and Nausea And Vomiting  . Penicillins Hives and Other (See Comments)    Reaction:  Blisters on hands/feet  Has patient had a PCN reaction causing immediate rash, facial/tongue/throat swelling, SOB or lightheadedness with hypotension: No Has patient had a PCN reaction causing severe rash involving mucus membranes or skin necrosis: No Has patient had a PCN reaction that required hospitalization No Has patient had a PCN reaction occurring within the last 10 years: No If all of the above answers are "NO", then may proceed with Cephalosporin use.     Current Meds  Medication Sig  . ALPRAZolam (XANAX) 0.5 MG tablet Take 1 tablet (0.5 mg total) by mouth at bedtime.  Marland Kitchen aspirin EC 325 MG tablet Take 325 mg by mouth daily.  . diphenhydramine-acetaminophen (TYLENOL PM) 25-500 MG TABS tablet Take 2 tablets by mouth at bedtime.  . docusate sodium (COLACE) 100 MG capsule Take 200 mg by mouth at bedtime.   . iron polysaccharides (NIFEREX) 150 MG capsule Take 150 mg by mouth daily.  Marland Kitchen KLOR-CON M20 20 MEQ tablet TAKE 1 TABLET BY MOUTH EVERY DAY  . lovastatin (MEVACOR) 40 MG tablet TAKE 2 TABLETS (80 MG TOTAL) BY MOUTH AT BEDTIME. (Patient taking differently: Take 40 mg by mouth at bedtime. )  . OXYGEN Inhale into the lungs daily. Bedtime  . pantoprazole (PROTONIX) 40 MG tablet TAKE 1 TABLET (40 MG TOTAL) BY MOUTH 2 (TWO) TIMES DAILY.  Marland Kitchen potassium chloride (K-DUR) 10 MEQ tablet Take 1 tablet (10 mEq total) by mouth daily.   . SYMBICORT 80-4.5 MCG/ACT inhaler INHALE 2 PUFFS INTO THE LUNGS TWICE A DAY  . TRADJENTA 5 MG TABS tablet TAKE 1 TABLET BY MOUTH EVERY DAY (Patient taking differently: Take 5 mg by mouth daily. )  . traZODone (DESYREL) 100 MG tablet TAKE 1 TABLET BY MOUTH EVERYDAY AT BEDTIME       Physical Exam BP 126/76   Pulse 96  Ht 5' 6.5" (1.689 m)   Wt 209 lb (94.8 kg)   BMI 33.23 kg/m  Physical Exam Vitals signs reviewed.  Constitutional:      Appearance: She is well-developed.  Neurological:     Mental Status: She is alert and oriented to person, place, and time.  Psychiatric:        Judgment: Judgment normal.    Ambulatory status normal with no assistive devices Right Shoulder Exam   Tenderness  Right shoulder tenderness location: Tender to the inferior aspect of the acromion deltoid and peri-acromial region.  Range of Motion  Active abduction: 80  Passive abduction: 110  External rotation: 50  Forward flexion: 150 (prom)   Muscle Strength  Abduction: 5/5  Internal rotation: 5/5  External rotation: 5/5  Supraspinatus: 5/5   Tests  Apprehension: negative Impingement: positive Drop arm: negative Sulcus: absent  Other  Erythema: absent Scars: absent Sensation: normal Pulse: present   Left Shoulder Exam   Tenderness  Left shoulder tenderness location: Peri-acromial region deltoid some acromial space posteriorly.  Range of Motion  Active abduction: 70  Passive abduction: 110  External rotation: 50  Forward flexion: 120 (prom)   Muscle Strength  Abduction: 5/5  Internal rotation: 5/5  External rotation: 5/5  Supraspinatus: 5/5   Tests  Apprehension: negative Cross arm: negative Impingement: positive Drop arm: negative Sulcus: absent  Other  Erythema: absent Scars: absent Sensation: normal Pulse: present       PROVOCATIVE TESTS DROP ARM TEST neg PAINFUL ARC 90-150 right and 90 through 130 left EMPTY CAN -JOBST TEST normal  strength EXTERNAL ROTATION LAG TEST negative LIFT OFF TEST negative BELLYPRESS TEST negative   MEDICAL DECISION SECTION  xrays ordered? Both shoulders dictated report separately  My independent reading of xrays: Summary of x-ray: Both x-rays showed normal shoulder joints   Encounter Diagnosis  Name Primary?  . Pain of both shoulder joints Yes     PLAN:   Procedure note the subacromial injection shoulder RIGHT  Verbal consent was obtained to inject the  RIGHT   Shoulder  Timeout was completed to confirm the injection site is a subacromial space of the  RIGHT  shoulder   Medication used Depo-Medrol 40 mg and lidocaine 1% 3 cc  Anesthesia was provided by ethyl chloride  The injection was performed in the RIGHT  posterior subacromial space. After pinning the skin with alcohol and anesthetized the skin with ethyl chloride the subacromial space was injected using a 20-gauge needle. There were no complications  Sterile dressing was applied.    Procedure note the subacromial injection shoulder left   Verbal consent was obtained to inject the  Left   Shoulder  Timeout was completed to confirm the injection site is a subacromial space of the  left  shoulder  Medication used Depo-Medrol 40 mg and lidocaine 1% 3 cc  Anesthesia was provided by ethyl chloride  The injection was performed in the left  posterior subacromial space. After pinning the skin with alcohol and anesthetized the skin with ethyl chloride the subacromial space was injected using a 20-gauge needle. There were no complications  Sterile dressing was applied.   No orders of the defined types were placed in this encounter.

## 2018-06-06 ENCOUNTER — Other Ambulatory Visit: Payer: Self-pay | Admitting: Family Medicine

## 2018-06-19 ENCOUNTER — Other Ambulatory Visit: Payer: Self-pay | Admitting: Family Medicine

## 2018-06-19 DIAGNOSIS — R0902 Hypoxemia: Secondary | ICD-10-CM | POA: Diagnosis not present

## 2018-06-24 DIAGNOSIS — M79671 Pain in right foot: Secondary | ICD-10-CM | POA: Diagnosis not present

## 2018-06-24 DIAGNOSIS — M25579 Pain in unspecified ankle and joints of unspecified foot: Secondary | ICD-10-CM | POA: Diagnosis not present

## 2018-06-24 DIAGNOSIS — I739 Peripheral vascular disease, unspecified: Secondary | ICD-10-CM | POA: Diagnosis not present

## 2018-06-24 DIAGNOSIS — M79672 Pain in left foot: Secondary | ICD-10-CM | POA: Diagnosis not present

## 2018-06-24 DIAGNOSIS — L110 Acquired keratosis follicularis: Secondary | ICD-10-CM | POA: Diagnosis not present

## 2018-06-25 ENCOUNTER — Encounter: Payer: Self-pay | Admitting: *Deleted

## 2018-06-25 ENCOUNTER — Ambulatory Visit (INDEPENDENT_AMBULATORY_CARE_PROVIDER_SITE_OTHER): Payer: Medicare Other | Admitting: Gastroenterology

## 2018-06-25 ENCOUNTER — Encounter

## 2018-06-25 ENCOUNTER — Encounter: Payer: Self-pay | Admitting: Gastroenterology

## 2018-06-25 VITALS — BP 127/85 | HR 110 | Temp 98.1°F | Ht 66.0 in | Wt 209.4 lb

## 2018-06-25 DIAGNOSIS — R1319 Other dysphagia: Secondary | ICD-10-CM

## 2018-06-25 DIAGNOSIS — K219 Gastro-esophageal reflux disease without esophagitis: Secondary | ICD-10-CM | POA: Diagnosis not present

## 2018-06-25 DIAGNOSIS — R1310 Dysphagia, unspecified: Secondary | ICD-10-CM | POA: Diagnosis not present

## 2018-06-25 NOTE — Progress Notes (Signed)
Subjective:    Patient ID: Cheryl Abbott, female    DOB: Oct 21, 1945, 73 y.o.   MRN: [id number]  [name], MD   HPI Having trouble with shoulder pain/cramps radiating to sides of abdomen and legs. NECK WAS SWOLLEN AND IT WENT DOWN. STILL HAS KNOW ON HER NECK AND IT WAS HARD.  THOUGHT IT WAS FATTY TISSUE. STAYS TIRED ALL THE TIME. ARMS HURT SO BAD SHE CAN'T CHANGE HER BED. FEELS LIKE SHE NEEDS AN AIDE. HANDS SWOLLEN. FEELS LIKE ITS HARD TO SWALLOW PILLS NOT FOOD REALLY. Last CT ABD/PELVIS MAY 2019: KIDNEY STONES/DIVERTICULOSIS/ASCVD. LAST TCS SEP 2018/EGD SEP 2018. BMs: NEITHER THEY JUST LOOK LIKE #6, HAD 7 YESTERDAY. ATE ICE CREAM AND GRILLED CHEESE FOR LUNCH. HAS PAIN IN CHEST OFF AND ON(HARD NEAR LEFT BREAST AND RADIATES TO LEFT SIDE. USUALLY LASTS A FEW SECONDS. ACID REFLUX MED WORKS WHEN SHE EATS AND SHE DOESN'T BELCH/BURP MAKES HER FEEL BETTER. SHE'S AFRAID OF WATER.  PT DENIES FEVER, CHILLS, HEMATOCHEZIA, HEMATEMESIS, nausea, vomiting, melena, SHORTNESS OF BREATH, CHANGE IN BOWEL IN HABITS, constipation, abdominal pain, OR heartburn or indigestion.  Past Medical History:  Diagnosis Date  . Anemia   . Anxiety   . Anxiety disorder   . Arthritis   . Cancer ([place])    acute myeloma  . 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 Chronic back pain   . Depression   . Fatty liver   . GERD (gastroesophageal reflux disease)   . Headache   . History of adenomatous polyp of colon    2008  . History of kidney stones   . Hypercalcemia   . Hyperlipidemia   . Hypertension   . Left ureteral stone   . Lipoma of arm 01/19/2015  . Lumbar disc disease with radiculopathy   . Nephrolithiasis   . Neuropathy, peripheral   . OSA treated with BiPAP    per study 2007  . Sciatica   . Sigmoid diverticulosis   . Type 2 diabetes mellitus ([place])    Past Surgical History:  Procedure Laterality Date  . BIOPSY N/A 03/17/2013   Procedure: GASTRIC BIOPSIES;  Surgeon: Danie Binder, MD;   Location: [facility];  Service: Endoscopy;  Laterality: N/A;  . BIOPSY  06/10/2015   Procedure: BIOPSY;  Surgeon: Danie Binder, MD;  [facility];  Service: Endoscopy;;  gastric biopsy  . CARDIAC CATHETERIZATION  05-11-2003  dr Shelva Majestic ([facility])   Abnormal cardiolite/   Normal coronary arteries and normal LVF,  ef  63%  . CARDIOVASCULAR STRESS TEST  01-01-2014   normal lexiscan cardiolite/  no ischemia/ infarct/  normal LV function and wall motion ,  ef 81%  . COLONOSCOPY  last one 10/02/2011   MOD [place] TICS, IH, NEXT TCS APR 2018  . COLONOSCOPY WITH PROPOFOL N/A 02/26/2017   Procedure: COLONOSCOPY WITH PROPOFOL;  Surgeon: Danie Binder, MD;  [facility];  Service: Endoscopy;  Laterality: N/A;  11:00am  . CYSTO/   RIGHT URETEROSOCOPY LASER LITHOTRIPSY STONE EXTRACTION  10-13-2004  . CYSTO/  RIGHT RETROGRADE PYELOGRAM/  PLACMENT RIGHT URETERAL STENT  01-10-2010  . CYSTOSCOPY W/ URETERAL STENT PLACEMENT Right 08/19/2015   Procedure: CYSTOSCOPY WITH RETROGRADE PYELOGRAM/URETERAL STENT PLACEMENT;  Surgeon: Franchot Gallo, MD;  Location: [facility];  Service: Urology;  Laterality: Right;  . CYSTOSCOPY WITH RETROGRADE PYELOGRAM, URETEROSCOPY AND STENT PLACEMENT Left 10/21/2014   Procedure: CYSTOSCOPY WITH RETROGRADE PYELOGRAM, URETEROSCOPY AND STENT PLACEMENT;  Surgeon: Franchot Gallo,  MD;  Location: [facility];  Service: Urology;  Laterality: Left;  . CYSTOSCOPY WITH RETROGRADE PYELOGRAM, URETEROSCOPY AND STENT PLACEMENT Right 11/22/2015   Procedure: CYSTOSCOPY, RIGHT URETERAL STENT REMOVAL; RIGHT RETROGRADE PYELOGRAM, RIGHT URETEROSCOPY WITH BALLOON DILATION; RIGHT URETERAL STENT PLACEMENT;  Surgeon: Franchot Gallo, MD;  [facility]: [facility];  Service: Urology;  Laterality: Right;  . CYSTOSCOPY/RETROGRADE/URETEROSCOPY/STONE EXTRACTION WITH BASKET Bilateral 08/02/2015   Procedure: CYSTOSCOPY; BILATERAL RETROGRADE PYELOGRAMS; BILATERAL URETEROSCOPY,  STONE EXTRACTION WITH BASKET;  Surgeon: Franchot Gallo, MD;  Location: [facility];  Service: Urology;  Laterality: Bilateral;  . CYSTOSCOPY/RETROGRADE/URETEROSCOPY/STONE EXTRACTION WITH BASKET Right 06/28/2015   Procedure: CYSTOSCOPY/RETROGRADE/URETEROSCOPY/STONE EXTRACTION WITH BASKET, RIGHT URETERAL DOUBLE J STENT PLACEMENT;  Surgeon: Franchot Gallo, MD;  [facility];  Service: Urology;  Laterality: Right;  . ESOPHAGOGASTRODUODENOSCOPY (EGD) WITH PROPOFOL N/A 03/17/2013   Procedure: ESOPHAGOGASTRODUODENOSCOPY (EGD) WITH PROPOFOL;  Surgeon: Danie Binder, MD;  [facility];  Service: Endoscopy;  Laterality: N/A;  . ESOPHAGOGASTRODUODENOSCOPY (EGD) WITH PROPOFOL N/A 06/10/2015   Distal gastritis, distal esophageal stricture s/p dilation  . ESOPHAGOGASTRODUODENOSCOPY (EGD) WITH PROPOFOL N/A 02/26/2017   Procedure: ESOPHAGOGASTRODUODENOSCOPY (EGD) WITH PROPOFOL;  Surgeon: Danie Binder, MD;  [facility];  Service: Endoscopy;  Laterality: N/A;  . FLEXIBLE SIGMOIDOSCOPY  09/11/2011   XBD:[id number] Internal hemorrhoids  . HOLMIUM LASER APPLICATION Left 6/83/4196   Procedure: HOLMIUM LASER APPLICATION;  Surgeon: Franchot Gallo, MD;  Location[facility];  Service: Urology;  Laterality: Left;  . HOLMIUM LASER APPLICATION Right 08/02/9796   Procedure: HOLMIUM LASER APPLICATION;  Surgeon: Franchot Gallo, MD;  [facility];  Service: Urology;  Laterality: Right;  . MASS EXCISION Left 03/04/2015   Procedure: EXCISION OF SOFT TISSUE NEOPLASM LEFT ARM;  Surgeon: Aviva Signs, MD;  Location: [facility];  Service: General;  Laterality: Left;  . PERCUTANEOUS NEPHROSTOLITHOTOMY Bilateral 12/  2011     [facility]  . POLYPECTOMY N/A 03/17/2013   Procedure: GASTRIC POLYPECTOMY;  Surgeon: Danie Binder, MD;  [facility];  Service: Endoscopy;  Laterality: N/A;  . POLYPECTOMY  02/26/2017   Procedure: POLYPECTOMY;  Surgeon: Danie Binder, MD;  [facility];   Service: Endoscopy;;  polyp at cecum, ascending colon polyps x3, hepatic flexure polyps x8, transverse colon polyps x8   . REMOVAL RIGHT THIGH CYST  2006  . SAVORY DILATION N/A 03/17/2013   Procedure: SAVORY DILATION;  Surgeon: Danie Binder, MD;  Location: [facility];  Service: Endoscopy;  Laterality: N/A;  #12.8, 14, 15, 16 dilators used  . SAVORY DILATION N/A 06/10/2015   Procedure: SAVORY DILATION;  Surgeon: Danie Binder, MD;  [facility];  Service: Endoscopy;  Laterality: N/A;  . TRANSTHORACIC ECHOCARDIOGRAM  01-01-2014   mild LVH/  ef 92-11%/  grade I diastolic dysfunction/  trivial MR, TR, and PR  . VAGINAL HYSTERECTOMY  1970's   Allergies  Allergen Reactions  . Ace Inhibitors Cough  . Keflex [Cephalexin] Diarrhea and Nausea And Vomiting  . Nitrofurantoin Diarrhea and Nausea And Vomiting  . Penicillins Hives and Other (See Comments)       Current Outpatient Medications  Medication Sig    . ALPRAZolam (XANAX) 0.5 MG tablet Take 1 tablet (0.5 mg total) by mouth at bedtime.    Marland Kitchen amLODipine (NORVASC) 10 MG tablet TAKE 1 TABLET BY MOUTH EVERY DAY (Patient taking differently: Take 10 mg by mouth daily. )    . aspirin EC 325 MG tablet Take 325 mg by mouth  daily.    . diphenhydramine-acetaminophen (TYLENOL PM) 25-500 MG TABS tablet Take 2 tablets by mouth at bedtime.    . docusate sodium (COLACE) 100 MG capsule Take 200 mg by mouth at bedtime.     . iron polysaccharides (NIFEREX) 150 MG capsule Take 150 mg by mouth daily.    Marland Kitchen KLOR-CON M20 20 MEQ tablet TAKE 1 TABLET BY MOUTH EVERY DAY    . lovastatin (MEVACOR) 40 MG tablet TAKE 2 TABLETS (80 MG TOTAL) BY MOUTH AT BEDTIME. (Patient taking differently: Take 40 mg by mouth at bedtime. )    . OXYGEN Inhale into the lungs daily. Bedtime    . pantoprazole (PROTONIX) 40 MG tablet TAKE 1 TABLET (40 MG TOTAL) BY MOUTH 2 (TWO) TIMES DAILY.    Marland Kitchen PARoxetine (PAXIL) 40 MG tablet TAKE 1 TABLET BY MOUTH EVERY DAY IN THE MORNING    .  potassium chloride (K-DUR) 10 MEQ tablet Take 1 tablet (10 mEq total) by mouth daily.    . SYMBICORT 80-4.5 MCG/ACT inhaler INHALE 2 PUFFS INTO THE LUNGS TWICE A DAY    . tiZANidine (ZANAFLEX) 2 MG tablet TAKE 1 TABLET BY MOUTH TWICE A DAY AS NEEDED FOR MUSCLE SPASM    . TRADJENTA 5 MG TABS tablet TAKE 1 TABLET BY MOUTH EVERY DAY    . traZODone (DESYREL) 100 MG tablet TAKE 1 TABLET BY MOUTH EVERYDAY AT BEDTIME     Review of Systems PER HPI OTHERWISE ALL SYSTEMS ARE NEGATIVE.    Objective:   Physical Exam Vitals signs reviewed.  Constitutional:      General: She is not in acute distress.    Appearance: She is well-developed.  HENT:     Head: Normocephalic and atraumatic.     Mouth/Throat:     Pharynx: No oropharyngeal exudate.  Eyes:     General: No scleral icterus.    Pupils: Pupils are equal, round, and reactive to light.  Neck:     Musculoskeletal: Normal range of motion and neck supple.  Cardiovascular:     Rate and Rhythm: Normal rate and regular rhythm.     Heart sounds: Normal heart sounds.  Pulmonary:     Effort: Pulmonary effort is normal. No respiratory distress.     Breath sounds: Normal breath sounds.  Abdominal:     General: Bowel sounds are normal. There is no distension.     Palpations: Abdomen is soft.     Tenderness: There is no abdominal tenderness.  Lymphadenopathy:     Cervical: No cervical adenopathy.  Neurological:     Mental Status: She is alert and oriented to person, place, and time.       Assessment & Plan:

## 2018-06-25 NOTE — Assessment & Plan Note (Signed)
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  DRINK WATER TO KEEP YOUR URINE LIGHT YELLOW. CONTINUE YOUR WEIGHT LOSS EFFORTS. HECK OUT TO YOGA CLASS AT [place]. TRY TO GO AT LEAST TWO TIMES A WEEK. OR CHECK OUT SILVER SNEAKERS AS WELL. CONTINUE PROTONIX. TAKE 30 MINUTES PRIOR TO MEALS TWICE DAILY. COMPLETE BARIUM SWALLOW.  FOLLOW UP IN 6 MOS.

## 2018-06-25 NOTE — Assessment & Plan Note (Signed)
WEIGHT UP 5 LBS SINCE FEB 2019.  CONTINUE YOUR WEIGHT LOSS EFFORTS. CONTINUE TO MONITOR SYMPTOMS. FOLLOW UP IN 6 MOS.

## 2018-06-25 NOTE — Assessment & Plan Note (Signed)
SYMPTOMS FAIRLY WELL CONTROLLED.  CONTINUE PROTONIX. TAKE 30 MINUTES PRIOR TO MEALS TWICE DAILY. AVOID TRIGGERS FOLLOW UP IN 6 MOS.

## 2018-06-25 NOTE — Progress Notes (Signed)
CC'D TO PCP °

## 2018-06-25 NOTE — Patient Instructions (Addendum)
DRINK WATER TO KEEP YOUR URINE LIGHT YELLOW.  CONTINUE YOUR WEIGHT LOSS EFFORTS.   CHECK OUT TO YOGA CLASS AT [place]. TRY TO GO AT LEAST TWO TIMES A WEEK. OR CHECK OUT SILVER SNEAKERS AS WELL.   IF YOU CONSUME DAIRY, ADD LACTASE 3 PILLS WITH MEALS UP TO THREE TIMES A DAY.  CONTINUE PROTONIX. TAKE 30 MINUTES PRIOR TO MEALS TWICE DAILY.   COMPLETE BARIUM SWALLOW.  FOLLOW UP IN 6 MOS.

## 2018-06-25 NOTE — Progress Notes (Signed)
cc'ed to pcp °

## 2018-06-30 ENCOUNTER — Ambulatory Visit (HOSPITAL_COMMUNITY)
Admission: RE | Admit: 2018-06-30 | Discharge: 2018-06-30 | Disposition: A | Discharge status: Home / Self Care | Payer: Medicare Other | Source: Ambulatory Visit | Attending: Gastroenterology | Admitting: Gastroenterology

## 2018-06-30 ENCOUNTER — Telehealth: Payer: Self-pay | Admitting: Gastroenterology

## 2018-06-30 DIAGNOSIS — R1310 Dysphagia, unspecified: Secondary | ICD-10-CM | POA: Insufficient documentation

## 2018-06-30 DIAGNOSIS — R1319 Other dysphagia: Secondary | ICD-10-CM

## 2018-06-30 NOTE — Telephone Encounter (Signed)
PLEASE CALL PT. 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 CONTINUE A SOFT MECHANICAL DIET.  MEATS SHOULD BE CHOPPED OR GROUND ONLY. DO NOT EAT CHUNKS OF ANYTHING. THERE IS NO INDICATION FOR ENDOSCOPY AT THIS TIME.

## 2018-06-30 NOTE — Telephone Encounter (Signed)
LMOM to call.

## 2018-07-01 NOTE — Telephone Encounter (Signed)
LMOM to call and mailing a letter to call also.

## 2018-07-02 ENCOUNTER — Telehealth: Payer: Self-pay | Admitting: Gastroenterology

## 2018-07-02 NOTE — Telephone Encounter (Signed)
[phone]  Patient returned call, please call back

## 2018-07-04 NOTE — Telephone Encounter (Signed)
Pt was informed of her Barium Swallow results. See results.

## 2018-07-08 ENCOUNTER — Other Ambulatory Visit: Payer: Self-pay | Admitting: Family Medicine

## 2018-07-08 IMAGING — DX DG CHEST 2V
2 series · 2 of 2 positions shown · non-contrast
Comparison: Prior radiograph from 08/18/2015.

CLINICAL DATA: Initial evaluation for acute chest pain and
shortness of breath with exertion.

EXAM:
CHEST  2 VIEW

[chest pa]
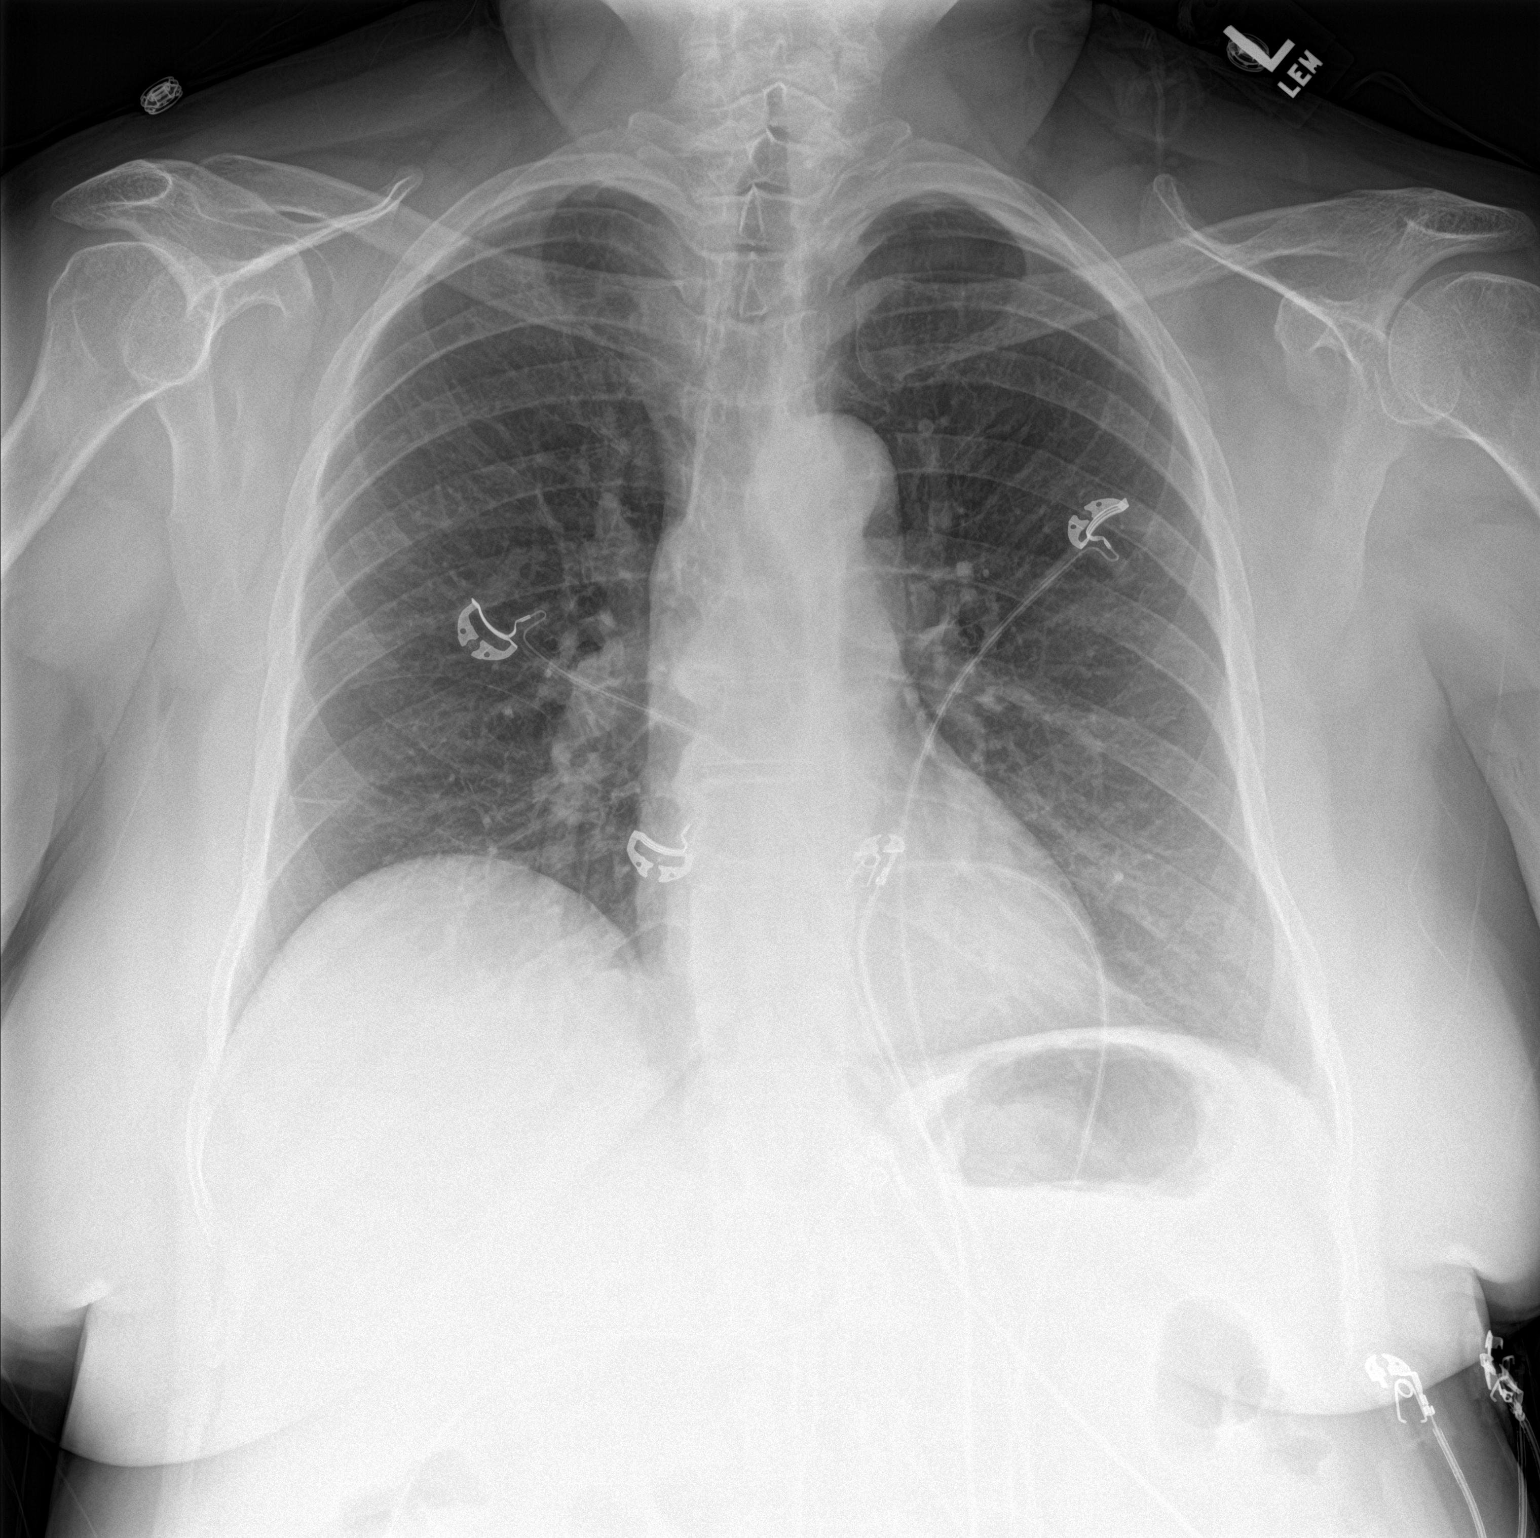

[chest lat]
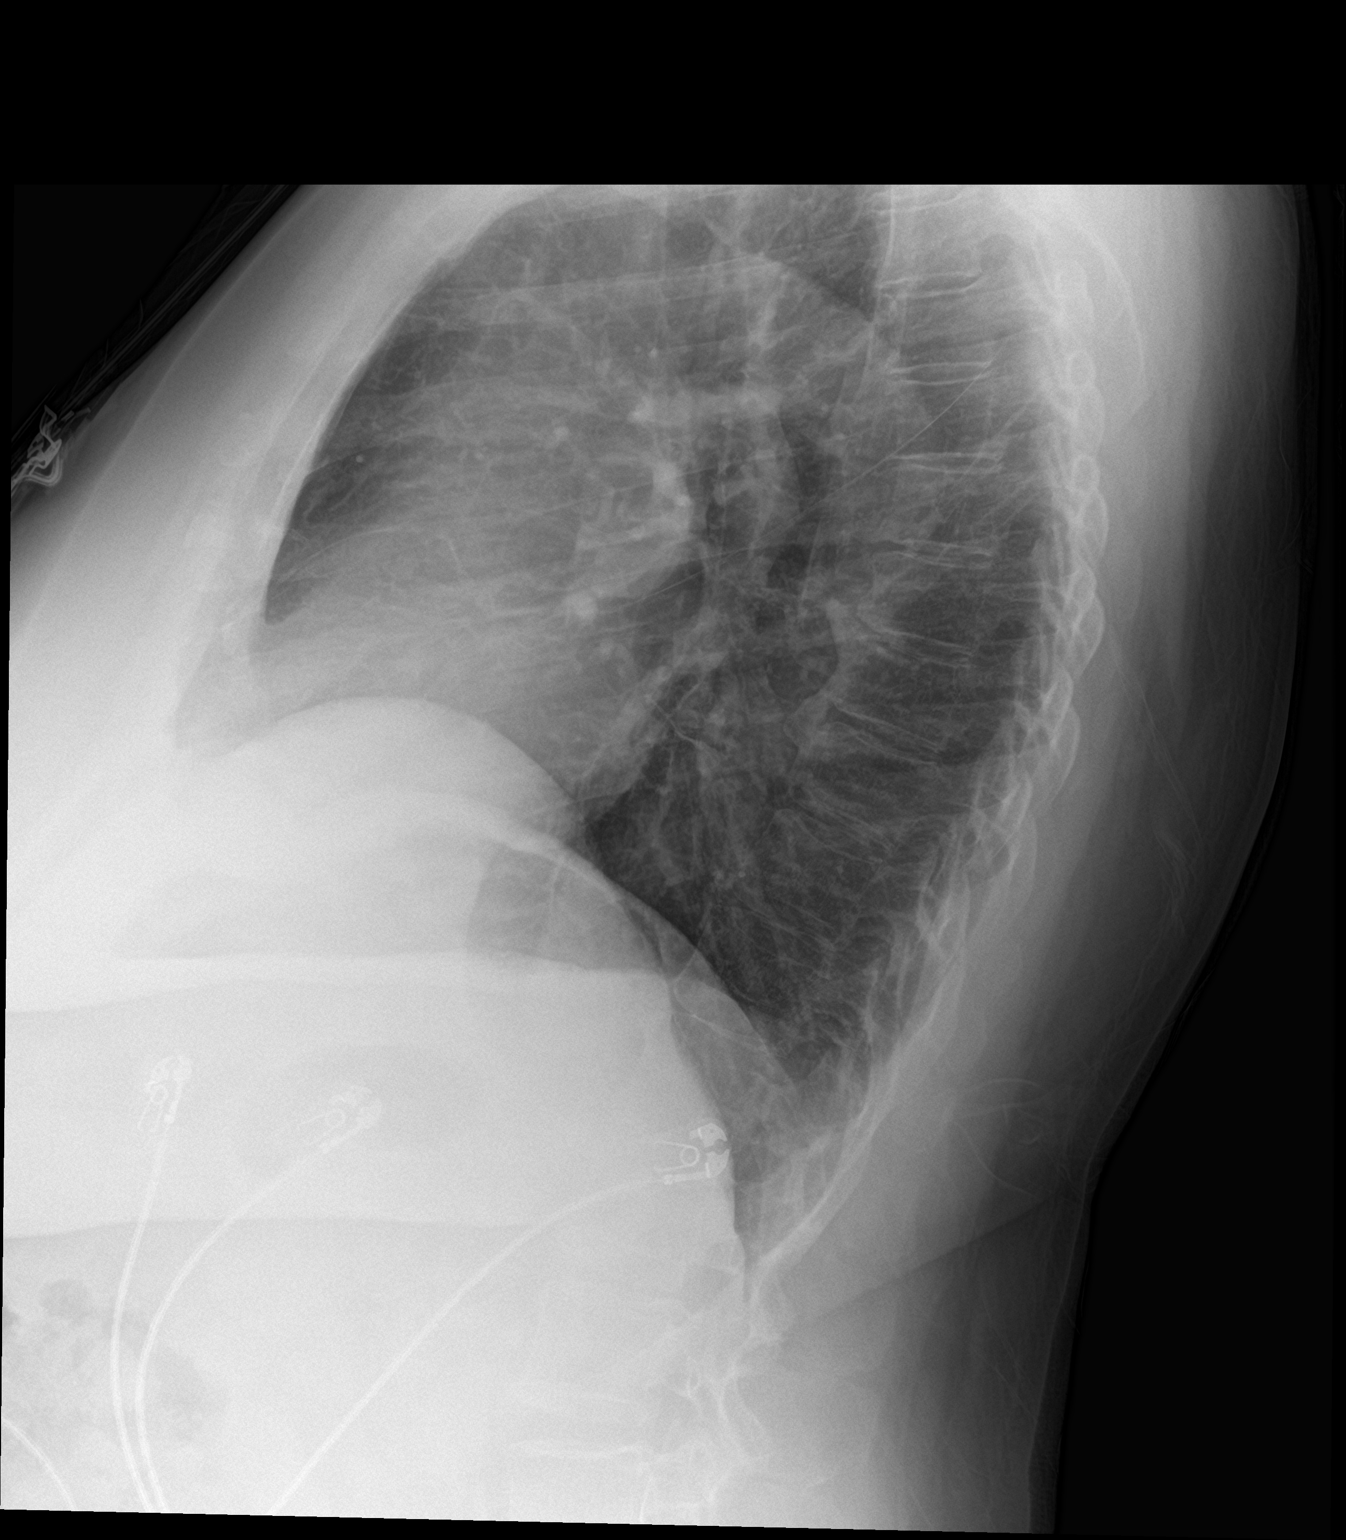

[2 of 2 positions shown; findings below may reference images not displayed]

FINDINGS: The cardiac and mediastinal silhouettes are stable in size and
contour, and remain within normal limits.

The lungs are normally inflated. No airspace consolidation, pleural
effusion, or pulmonary edema is identified. There is no
pneumothorax.

No acute osseous abnormality identified.
IMPRESSION: No active cardiopulmonary disease.

## 2018-07-16 ENCOUNTER — Other Ambulatory Visit: Payer: Self-pay | Admitting: Family Medicine

## 2018-07-16 ENCOUNTER — Emergency Department (HOSPITAL_COMMUNITY): Payer: Medicare Other

## 2018-07-16 ENCOUNTER — Telehealth: Payer: Self-pay | Admitting: Family Medicine

## 2018-07-16 ENCOUNTER — Emergency Department (HOSPITAL_COMMUNITY)
Admission: EM | Admit: 2018-07-16 | Discharge: 2018-07-16 | Disposition: A | Discharge status: Home / Self Care | Payer: Medicare Other | Source: Home / Self Care

## 2018-07-16 ENCOUNTER — Other Ambulatory Visit: Payer: Self-pay

## 2018-07-16 ENCOUNTER — Emergency Department (HOSPITAL_COMMUNITY)
Admission: EM | Admit: 2018-07-16 | Discharge: 2018-07-16 | Disposition: A | Discharge status: Home / Self Care | Payer: Medicare Other | Attending: Emergency Medicine | Admitting: Emergency Medicine

## 2018-07-16 ENCOUNTER — Inpatient Hospital Stay (HOSPITAL_COMMUNITY): Payer: Medicare Other | Attending: Hematology

## 2018-07-16 ENCOUNTER — Encounter (HOSPITAL_COMMUNITY): Payer: Self-pay

## 2018-07-16 ENCOUNTER — Encounter (HOSPITAL_COMMUNITY): Payer: Self-pay | Admitting: Emergency Medicine

## 2018-07-16 DIAGNOSIS — R52 Pain, unspecified: Secondary | ICD-10-CM

## 2018-07-16 DIAGNOSIS — R05 Cough: Secondary | ICD-10-CM | POA: Diagnosis not present

## 2018-07-16 DIAGNOSIS — I129 Hypertensive chronic kidney disease with stage 1 through stage 4 chronic kidney disease, or unspecified chronic kidney disease: Secondary | ICD-10-CM | POA: Diagnosis not present

## 2018-07-16 DIAGNOSIS — R799 Abnormal finding of blood chemistry, unspecified: Secondary | ICD-10-CM | POA: Insufficient documentation

## 2018-07-16 DIAGNOSIS — Z79899 Other long term (current) drug therapy: Secondary | ICD-10-CM | POA: Diagnosis not present

## 2018-07-16 DIAGNOSIS — N183 Chronic kidney disease, stage 3 (moderate): Secondary | ICD-10-CM | POA: Diagnosis not present

## 2018-07-16 DIAGNOSIS — Z5321 Procedure and treatment not carried out due to patient leaving prior to being seen by health care provider: Secondary | ICD-10-CM | POA: Insufficient documentation

## 2018-07-16 DIAGNOSIS — B349 Viral infection, unspecified: Secondary | ICD-10-CM | POA: Insufficient documentation

## 2018-07-16 DIAGNOSIS — Z87891 Personal history of nicotine dependence: Secondary | ICD-10-CM | POA: Insufficient documentation

## 2018-07-16 DIAGNOSIS — Z7984 Long term (current) use of oral hypoglycemic drugs: Secondary | ICD-10-CM | POA: Insufficient documentation

## 2018-07-16 DIAGNOSIS — E1122 Type 2 diabetes mellitus with diabetic chronic kidney disease: Secondary | ICD-10-CM | POA: Insufficient documentation

## 2018-07-16 DIAGNOSIS — Z7982 Long term (current) use of aspirin: Secondary | ICD-10-CM | POA: Diagnosis not present

## 2018-07-16 DIAGNOSIS — M7918 Myalgia, other site: Secondary | ICD-10-CM | POA: Diagnosis present

## 2018-07-16 DIAGNOSIS — D890 Polyclonal hypergammaglobulinemia: Secondary | ICD-10-CM

## 2018-07-16 DIAGNOSIS — D472 Monoclonal gammopathy: Secondary | ICD-10-CM

## 2018-07-16 HISTORY — DX: Pain in right shoulder: M25.511

## 2018-07-16 HISTORY — DX: Pain in left shoulder: M25.512

## 2018-07-16 LAB — COMPREHENSIVE METABOLIC PANEL
ALT: 24 U/L (ref 0–44)
ALT: 23 U/L (ref 0–44)
AST: 33 U/L (ref 15–41)
AST: 32 U/L (ref 15–41)
Albumin: 4 g/dL (ref 3.5–5.0)
Albumin: 4.3 g/dL (ref 3.5–5.0)
Alkaline Phosphatase: 58 U/L (ref 38–126)
Alkaline Phosphatase: 62 U/L (ref 38–126)
Anion gap: 8 (ref 5–15)
Anion gap: 12 (ref 5–15)
BUN: 17 mg/dL (ref 8–23)
BUN: 16 mg/dL (ref 8–23)
CO2: 23 mmol/L (ref 22–32)
CO2: 23 mmol/L (ref 22–32)
Calcium: 9.8 mg/dL (ref 8.9–10.3)
Calcium: 10 mg/dL (ref 8.9–10.3)
Chloride: 102 mmol/L (ref 98–111)
Chloride: 100 mmol/L (ref 98–111)
Creatinine, Ser: 1.75 mg/dL — ABNORMAL HIGH (ref 0.44–1.00)
Creatinine, Ser: 1.68 mg/dL — ABNORMAL HIGH (ref 0.44–1.00)
GFR calc Af Amer: 33 mL/min — ABNORMAL LOW (ref >60)
GFR calc Af Amer: 34 mL/min — ABNORMAL LOW (ref >60)
GFR calc non Af Amer: 28 mL/min — ABNORMAL LOW (ref >60)
GFR calc non Af Amer: 30 mL/min — ABNORMAL LOW (ref >60)
Glucose, Bld: 95 mg/dL (ref 70–99)
Glucose, Bld: 101 mg/dL — ABNORMAL HIGH (ref 70–99)
Potassium: 4.2 mmol/L (ref 3.5–5.1)
Potassium: 3.9 mmol/L (ref 3.5–5.1)
Sodium: 133 mmol/L — ABNORMAL LOW (ref 135–145)
Sodium: 135 mmol/L (ref 135–145)
Total Bilirubin: 0.4 mg/dL (ref 0.3–1.2)
Total Bilirubin: 0.3 mg/dL (ref 0.3–1.2)
Total Protein: 8.3 g/dL — ABNORMAL HIGH (ref 6.5–8.1)
Total Protein: 8.5 g/dL — ABNORMAL HIGH (ref 6.5–8.1)

## 2018-07-16 LAB — CBC WITH DIFFERENTIAL/PLATELET
Abs Immature Granulocytes: 0.05 10*3/uL (ref 0.00–0.07)
Abs Immature Granulocytes: 0.04 10*3/uL (ref 0.00–0.07)
Basophils Absolute: 0 10*3/uL (ref 0.0–0.1)
Basophils Absolute: 0 10*3/uL (ref 0.0–0.1)
Basophils Relative: 0 %
Basophils Relative: 0 %
Eosinophils Absolute: 0.2 10*3/uL (ref 0.0–0.5)
Eosinophils Absolute: 0.2 10*3/uL (ref 0.0–0.5)
Eosinophils Relative: 3 %
Eosinophils Relative: 3 %
HCT: 40.3 % (ref 36.0–46.0)
HCT: 42.5 % (ref 36.0–46.0)
Hemoglobin: 12.9 g/dL (ref 12.0–15.0)
Hemoglobin: 13.7 g/dL (ref 12.0–15.0)
Immature Granulocytes: 1 %
Immature Granulocytes: 1 %
Lymphocytes Relative: 9 %
Lymphocytes Relative: 11 %
Lymphs Abs: 0.6 10*3/uL — ABNORMAL LOW (ref 0.7–4.0)
Lymphs Abs: 0.7 10*3/uL (ref 0.7–4.0)
MCH: 31.9 pg (ref 26.0–34.0)
MCH: 32.1 pg (ref 26.0–34.0)
MCHC: 32 g/dL (ref 30.0–36.0)
MCHC: 32.2 g/dL (ref 30.0–36.0)
MCV: 99.5 fL (ref 80.0–100.0)
MCV: 99.5 fL (ref 80.0–100.0)
Monocytes Absolute: 1 10*3/uL (ref 0.1–1.0)
Monocytes Absolute: 1 10*3/uL (ref 0.1–1.0)
Monocytes Relative: 14 %
Monocytes Relative: 15 %
Neutro Abs: 5.3 10*3/uL (ref 1.7–7.7)
Neutro Abs: 4.6 10*3/uL (ref 1.7–7.7)
Neutrophils Relative %: 73 %
Neutrophils Relative %: 70 %
Platelets: 251 10*3/uL (ref 150–400)
Platelets: 261 10*3/uL (ref 150–400)
RBC: 4.05 MIL/uL (ref 3.87–5.11)
RBC: 4.27 MIL/uL (ref 3.87–5.11)
RDW: 14.3 % (ref 11.5–15.5)
RDW: 14.4 % (ref 11.5–15.5)
WBC: 7.2 10*3/uL (ref 4.0–10.5)
WBC: 6.7 10*3/uL (ref 4.0–10.5)
nRBC: 0 % (ref 0.0–0.2)
nRBC: 0 % (ref 0.0–0.2)

## 2018-07-16 LAB — LACTATE DEHYDROGENASE: LDH: 127 U/L (ref 98–192)

## 2018-07-16 LAB — INFLUENZA PANEL BY PCR (TYPE A & B)
Influenza A By PCR: NEGATIVE
Influenza B By PCR: NEGATIVE

## 2018-07-16 MED ORDER — TRAMADOL HCL 50 MG PO TABS: 50.0000 mg | ORAL_TABLET | Freq: Four times a day (QID) | ORAL | 0 refills | Status: DC | PRN

## 2018-07-16 NOTE — ED Notes (Addendum)
Dr Sabra Heck informed this nurse that he had spoke with oncologist regarding lab work that pt had done today - lab work was reviewed per oncologist- no significant changes or new abnormalities were reported. Informed pt of this, pt asked if she could go home, informed the pt that she can go home unless she wants to be seen for something in the Ed. Pt says she is going home to get some sleep.

## 2018-07-16 NOTE — Discharge Instructions (Signed)
See your Physician for recheck.  Return if any problems.  °

## 2018-07-16 NOTE — ED Triage Notes (Signed)
Pt was called by oncology Dr and told to come back to the hospital.  Was told something was wrong with her labs.

## 2018-07-16 NOTE — ED Provider Notes (Signed)
[facility] EMERGENCY DEPARTMENT Provider Note   CSN: [id number] Arrival date & time: 07/16/18  1505     History   Chief Complaint Chief Complaint  Patient presents with  . Generalized Body Aches    HPI Cheryl Abbott is a 73 y.o. female.  The history is provided by the patient. No language interpreter was used.  Cough  Cough characteristics:  Non-productive Sputum characteristics:  Nondescript Severity:  Moderate Onset quality:  Gradual Timing:  Constant Progression:  Worsening Smoker: no   Context: not sick contacts   Relieved by:  Nothing Worsened by:  Nothing Ineffective treatments:  None tried Associated symptoms: rhinorrhea   Associated symptoms: no shortness of breath   Pt complains of a cough and congestion.  Pt reports she has been having cramps all over for over 2 weeks.  Pt reports no relief with muscle relaxer  Past Medical History:  Diagnosis Date  . Anemia   . Anxiety   . Anxiety disorder   . Arthritis   . Cancer ([place])    acute myeloma  . 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 Chronic back pain   . Depression   . Fatty liver   . GERD (gastroesophageal reflux disease)   . Headache   . History of adenomatous polyp of colon    2008  . History of kidney stones   . Hypercalcemia   . Hyperlipidemia   . Hypertension   . Left ureteral stone   . Lipoma of arm 01/19/2015  . Lumbar disc disease with radiculopathy   . Nephrolithiasis   . Neuropathy, peripheral   . OSA treated with BiPAP    per study 2007  . Sciatica   . Shoulder pain, bilateral   . Sigmoid diverticulosis   . Type 2 diabetes mellitus [facility])     Patient Active Problem List   Diagnosis Date Noted  . Shoulder pain, left 02/28/2018  . Palpable mass of neck 02/28/2018  . Exertional dyspnea 02/28/2018  . Insomnia 11/09/2017  . Generalized pain 11/09/2017  . Multiple myeloma ([address]) 03/15/2017  . Urinary frequency 02/28/2017  . 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 Morbid  obesity ([place]) 10/14/2016  . Vaginal dryness, menopausal 10/14/2016  . Neuropathic pain of both feet 04/11/2016  . TIA (transient ischemic attack) 02/26/2016  . Lactose intolerance in adult 02/01/2016  . Brain TIA 08/19/2015  . CKD (chronic kidney disease) stage 3, GFR 30-59 ml/min (HCC) 07/12/2015  . Lipoma of arm 01/19/2015  . Anemia 10/14/2014  . Lumbar back pain with radiculopathy affecting left lower extremity 05/11/2014  . Nocturnal hypoxia 02/04/2014  . Obesity (BMI 30.0-34.9) 01/02/2014  . Generalized anxiety disorder 05/20/2013  . Dysphagia, idiopathic 03/04/2013  . Anxiety and depression 08/13/2012  . Abnormal brain scan 02/07/2012  . Thoracic or lumbosacral neuritis or radiculitis, unspecified 12/22/2010  . Hypercalcemia 11/30/2010  . Colon adenomas 05/11/2009  . HEMORRHOIDS 03/15/2009  . GERD 03/15/2009  . FATTY LIVER DISEASE 03/15/2009  . RENAL CALCULUS 03/15/2009  . Sleep apnea 09/15/2008  . 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 Hyperlipidemia LDL goal <100 09/17/2007  . Hypertension goal BP (blood pressure) < 130/80 09/17/2007    Past Surgical History:  Procedure Laterality Date  . BIOPSY N/A 03/17/2013   Procedure: GASTRIC BIOPSIES;  Surgeon: Danie Binder, MD;  Location: [facility] OR[facility];  Service: Endoscopy;  Laterality: N/A;  . BIOPSY  06/10/2015   Procedure: BIOPSY;  Surgeon: Danie Binder, MD;  Location: [facility] ENDO [facility];  Service: Endoscopy;;  gastric biopsy  . CARDIAC CATHETERIZATION  05-11-2003  dr Shelva Majestic ([facility])   Abnormal cardiolite/   Normal coronary arteries and normal LVF,  ef  63%  . CARDIOVASCULAR STRESS TEST  01-01-2014   normal lexiscan cardiolite/  no ischemia/ infarct/  normal LV function and wall motion ,  ef 81%  . COLONOSCOPY  last one 10/02/2011   MOD Bier TICS, IH, NEXT TCS APR 2018  . COLONOSCOPY WITH PROPOFOL N/A 02/26/2017   Procedure: COLONOSCOPY WITH PROPOFOL;  Surgeon: Danie Binder, MD;  Location: [facility]  ENDO SUITE;  Service: Endoscopy;  Laterality: N/A;  11:00am  . CYSTO/   RIGHT URETEROSOCOPY LASER LITHOTRIPSY STONE EXTRACTION  10-13-2004  . CYSTO/  RIGHT RETROGRADE PYELOGRAM/  PLACMENT RIGHT URETERAL STENT  01-10-2010  . CYSTOSCOPY W/ URETERAL STENT PLACEMENT Right 08/19/2015   Procedure: CYSTOSCOPY WITH RETROGRADE PYELOGRAM/URETERAL STENT PLACEMENT;  Surgeon: Franchot Gallo, MD;  Location: [facility];  Service: Urology;  Laterality: Right;  . CYSTOSCOPY WITH RETROGRADE PYELOGRAM, URETEROSCOPY AND STENT PLACEMENT Left 10/21/2014   Procedure: CYSTOSCOPY WITH RETROGRADE PYELOGRAM, URETEROSCOPY AND STENT PLACEMENT;  Surgeon: Franchot Gallo, MD;  Location: [facility];  Service: Urology;  Laterality: Left;  . CYSTOSCOPY WITH RETROGRADE PYELOGRAM, URETEROSCOPY AND STENT PLACEMENT Right 11/22/2015   Procedure: CYSTOSCOPY, RIGHT URETERAL STENT REMOVAL; RIGHT RETROGRADE PYELOGRAM, RIGHT URETEROSCOPY WITH BALLOON DILATION; RIGHT URETERAL STENT PLACEMENT;  Surgeon: Franchot Gallo, MD;  Location: [facility];  Service: Urology;  Laterality: Right;  . CYSTOSCOPY/RETROGRADE/URETEROSCOPY/STONE EXTRACTION WITH BASKET Bilateral 08/02/2015   Procedure: CYSTOSCOPY; BILATERAL RETROGRADE PYELOGRAMS; BILATERAL URETEROSCOPY, STONE EXTRACTION WITH BASKET;  Surgeon: Franchot Gallo, MD;  Location: [facility];  Service: Urology;  Laterality: Bilateral;  . CYSTOSCOPY/RETROGRADE/URETEROSCOPY/STONE EXTRACTION WITH BASKET Right 06/28/2015   Procedure: CYSTOSCOPY/RETROGRADE/URETEROSCOPY/STONE EXTRACTION WITH BASKET, RIGHT URETERAL DOUBLE J STENT PLACEMENT;  Surgeon: Franchot Gallo, MD;  Location: [facility];  Service: Urology;  Laterality: Right;  . ESOPHAGOGASTRODUODENOSCOPY (EGD) WITH PROPOFOL N/A 03/17/2013   Procedure: ESOPHAGOGASTRODUODENOSCOPY (EGD) WITH PROPOFOL;  Surgeon: Danie Binder, MD;  Location: [facility];  Service: Endoscopy;  Laterality: N/A;  . ESOPHAGOGASTRODUODENOSCOPY (EGD) WITH PROPOFOL N/A 06/10/2015     Distal gastritis, distal esophageal stricture s/p dilation  . ESOPHAGOGASTRODUODENOSCOPY (EGD) WITH PROPOFOL N/A 02/26/2017   Procedure: ESOPHAGOGASTRODUODENOSCOPY (EGD) WITH PROPOFOL;  Surgeon: Danie Binder, MD;  [facility];  Service: Endoscopy;  Laterality: N/A;  . FLEXIBLE SIGMOIDOSCOPY  09/11/2011   TWS:FKCLEXNT Internal hemorrhoids  . HOLMIUM LASER APPLICATION Left 7/00/1749   Procedure: HOLMIUM LASER APPLICATION;  Surgeon: Franchot Gallo, MD;  Location: [facility];  Service: Urology;  Laterality: Left;  . HOLMIUM LASER APPLICATION Right 4/49/6759   Procedure: HOLMIUM LASER APPLICATION;  Surgeon: Franchot Gallo, MD;  Location: [facility];  Service: Urology;  Laterality: Right;  . MASS EXCISION Left 03/04/2015   Procedure: EXCISION OF SOFT TISSUE NEOPLASM LEFT ARM;  Surgeon: Aviva Signs, MD;  Location: [facility];  Service: General;  Laterality: Left;  . PERCUTANEOUS NEPHROSTOLITHOTOMY Bilateral 12/  2011     [facility]  . POLYPECTOMY N/A 03/17/2013   Procedure: GASTRIC POLYPECTOMY;  Surgeon: Danie Binder, MD;  Location: [facility];  Service: Endoscopy;  Laterality: N/A;  . POLYPECTOMY  02/26/2017   Procedure: POLYPECTOMY;  Surgeon: Danie Binder, MD;  [facility];  Service: Endoscopy;;  polyp at cecum, ascending colon polyps x3, hepatic flexure polyps x8, transverse colon polyps x8   . REMOVAL RIGHT THIGH CYST  2006  .  SAVORY DILATION N/A 03/17/2013   Procedure: SAVORY DILATION;  Surgeon: Danie Binder, MD;  Location: [facility];  Service: Endoscopy;  Laterality: N/A;  #12.8, 14, 15, 16 dilators used  . SAVORY DILATION N/A 06/10/2015   Procedure: SAVORY DILATION;  Surgeon: Danie Binder, MD;  [facility];  Service: Endoscopy;  Laterality: N/A;  . TRANSTHORACIC ECHOCARDIOGRAM  01-01-2014   mild LVH/  ef 94-70%/  grade I diastolic dysfunction/  trivial MR, TR, and PR  . VAGINAL HYSTERECTOMY  1970's     OB History    Gravida  2   Para  2    Term  2   Preterm      AB      Living  2     SAB      TAB      Ectopic      Multiple      Live Births               Home Medications    Prior to Admission medications   Medication Sig Start Date End Date Taking? Authorizing Provider  ALPRAZolam Duanne Moron) 0.5 MG tablet Take 1 tablet (0.5 mg total) by mouth at bedtime. 05/24/18 05/25/19  [name], MD  amLODipine (NORVASC) 10 MG tablet TAKE 1 TABLET BY MOUTH EVERY DAY Patient taking differently: Take 10 mg by mouth daily.  12/06/17 06/25/18  [name], MD  aspirin EC 325 MG tablet Take 325 mg by mouth daily.    [provider]  diphenhydramine-acetaminophen (TYLENOL PM) 25-500 MG TABS tablet Take 2 tablets by mouth at bedtime.    [provider]  docusate sodium (COLACE) 100 MG capsule Take 200 mg by mouth at bedtime.     [provider]  iron polysaccharides (NIFEREX) 150 MG capsule Take 150 mg by mouth daily.    [provider]  KLOR-CON M20 20 MEQ tablet TAKE 1 TABLET BY MOUTH EVERY DAY 05/13/18   Arnoldo Lenis, MD  lovastatin (MEVACOR) 40 MG tablet Take 1 tablet (40 mg total) by mouth at bedtime. 07/08/18   [name], MD  OXYGEN Inhale into the lungs daily. Bedtime    [provider]  pantoprazole (PROTONIX) 40 MG tablet TAKE 1 TABLET (40 MG TOTAL) BY MOUTH 2 (TWO) TIMES DAILY. 06/19/18   [name], MD  PARoxetine (PAXIL) 40 MG tablet TAKE 1 TABLET BY MOUTH EVERY DAY IN THE MORNING 07/16/18   [name], MD  potassium chloride (K-DUR) 10 MEQ tablet Take 1 tablet (10 mEq total) by mouth daily. 05/06/18   Julianne Rice, MD  SYMBICORT 80-4.5 MCG/ACT inhaler INHALE 2 PUFFS INTO THE LUNGS TWICE A DAY 05/29/18   [name], MD  tiZANidine (ZANAFLEX) 2 MG tablet TAKE 1 TABLET BY MOUTH TWICE A DAY AS NEEDED FOR MUSCLE SPASM 06/09/18   [name], MD  TRADJENTA 5 MG TABS tablet TAKE 1 TABLET BY MOUTH EVERY DAY 06/19/18    [name], MD  traMADol (ULTRAM) 50 MG tablet Take 1 tablet (50 mg total) by mouth every 6 (six) hours as needed. 07/16/18 07/16/19  Fransico Meadow, PA-C  traZODone (DESYREL) 100 MG tablet TAKE 1 TABLET BY MOUTH EVERYDAY AT BEDTIME 03/15/18   [name], MD    Family History Family History  Problem Relation Age of Onset  . Hypertension Mother   . Diabetes Mother   . Heart failure Mother   .  Dementia Mother   . Emphysema Father   . Hypertension Father   . Diabetes Brother   . GER disease Brother   . Hypertension Sister   . Hypertension Sister   . Cancer Other        family history   . Diabetes Other        family history   . Heart defect Other        famiily history   . Arthritis Other        family history   . Anesthesia problems Neg Hx   . Hypotension Neg Hx   . Malignant hyperthermia Neg Hx   . Pseudochol deficiency Neg Hx   . Colon cancer Neg Hx     Social History Social History   Tobacco Use  . Smoking status: Former Smoker    Packs/day: 1.00    Years: 20.00    Pack years: 20.00    Types: Cigarettes    Start date: 09/21/1974    Last attempt to quit: 09/20/1988    Years since quitting: 29.8  . Smokeless tobacco: Never Used  . Tobacco comment: Quit x 20 years  Substance Use Topics  . Alcohol use: No    Alcohol/week: 0.0 standard drinks  . Drug use: No     Allergies   Ace inhibitors; Keflex [cephalexin]; Nitrofurantoin; and Penicillins   Review of Systems Review of Systems  HENT: Positive for rhinorrhea.   Respiratory: Positive for cough. Negative for shortness of breath.   All other systems reviewed and are negative.    Physical Exam Updated Vital Signs BP 106/66 (BP Location: Left Arm)   Pulse 97   Temp 98.6 F (37 C) (Oral)   Resp 16   Ht 5' 6"  (1.676 m)   Wt 95.3 kg   SpO2 94%   BMI 33.89 kg/m   Physical Exam Vitals signs and nursing note reviewed.  Constitutional:      Appearance: She is well-developed.  HENT:      Head: Normocephalic.     Nose: Nose normal.     Mouth/Throat:     Mouth: Mucous membranes are moist.  Eyes:     Pupils: Pupils are equal, round, and reactive to light.  Neck:     Musculoskeletal: Normal range of motion.  Cardiovascular:     Rate and Rhythm: Normal rate.     Pulses: Normal pulses.  Pulmonary:     Effort: Pulmonary effort is normal.  Abdominal:     General: There is no distension.  Musculoskeletal: Normal range of motion.  Skin:    General: Skin is warm.  Neurological:     General: No focal deficit present.     Mental Status: She is alert and oriented to person, place, and time.  Psychiatric:        Mood and Affect: Mood normal.      ED Treatments / Results  Labs (all labs ordered are listed, but only abnormal results are displayed) Labs Reviewed  CBC WITH DIFFERENTIAL/PLATELET - Abnormal; Notable for the following components:      Result Value   Lymphs Abs 0.6 (*)    All other components within normal limits  COMPREHENSIVE METABOLIC PANEL - Abnormal; Notable for the following components:   Sodium 133 (*)    Creatinine, Ser 1.75 (*)    Total Protein 8.3 (*)    GFR calc non Af Amer 28 (*)    GFR calc Af Amer 33 (*)    All  other components within normal limits  INFLUENZA PANEL BY PCR (TYPE A & B)    EKG None  Radiology Dg Chest 2 View  Result Date: 07/16/2018 CLINICAL DATA:  Productive cough. EXAM: CHEST - 2 VIEW COMPARISON:  Radiographs of March 11, 2018. FINDINGS: The heart size and mediastinal contours are within normal limits. No pneumothorax or pleural effusion is noted. Left lung is clear. Stable elevated right hemidiaphragm with mild right basilar subsegmental atelectasis. The visualized skeletal structures are unremarkable. IMPRESSION: Stable elevated right hemidiaphragm with mild right basilar subsegmental atelectasis. No other abnormality seen in the chest. Electronically Signed   By: Marijo Conception, M.D.   On: 07/16/2018 09:50     Procedures Procedures (including critical care time)  Medications Ordered in ED Medications - No data to display   Initial Impression / Assessment and Plan / ED Course  I have reviewed the triage vital signs and the nursing notes.  Pertinent labs & imaging results that were available during my care of the patient were reviewed by me and considered in my medical decision making (see chart for details).  Clinical Course as of Jul 16 1508  Wed Jul 16, 2018  1038 Sodium(!): 133 [LS]    Clinical Course User Index [LS] Fransico Meadow, [place]    MDM  Chest xray no pneumonia.  Influenza negative.  I will give pt rx for tramadol for pain.  Pt is advised to see her MD for recheck   Final Clinical Impressions(s) / ED Diagnoses   Final diagnoses:  Viral syndrome  Body aches    ED Discharge Orders         Ordered    traMADol (ULTRAM) 50 MG tablet  Every 6 hours PRN     07/16/18 1043        An After Visit Summary was printed and given to the patient.    Fransico Meadow, PA-C 07/16/18 [place], [place], DO 07/17/18 [phone]

## 2018-07-16 NOTE — ED Triage Notes (Signed)
Pt reports intermittent cramping all over for the past 2 weeks and productive cough with white sputum x 1 week.

## 2018-07-16 NOTE — Telephone Encounter (Signed)
Called around 5 am stating that unable to sleep due to generalized pain and cramping with body spasms, for past 2 weeks No medication is helping and requesting hospitalization for relief. Has appt in Oncology for lab work at midday today  I advised I will send a message to her Oncologist making him aware of the situation to see if he can assist in any way/( unsure if bone diease is the cause of her pain) . If he is unable I advised she may need to have th ED evaluate her ( may also need to offer a work in appt this week) I will send a staff message to O ncologist

## 2018-07-17 LAB — KAPPA/LAMBDA LIGHT CHAINS
Kappa free light chain: 43.9 mg/L — ABNORMAL HIGH (ref 3.3–19.4)
Kappa, lambda light chain ratio: 2.47 — ABNORMAL HIGH (ref 0.26–1.65)
Lambda free light chains: 17.8 mg/L (ref 5.7–26.3)

## 2018-07-17 LAB — IGG, IGA, IGM
IgA: 248 mg/dL (ref 64–422)
IgG (Immunoglobin G), Serum: 2054 mg/dL — ABNORMAL HIGH (ref 700–1600)
IgM (Immunoglobulin M), Srm: 145 mg/dL (ref 26–217)

## 2018-07-18 ENCOUNTER — Ambulatory Visit (HOSPITAL_COMMUNITY): Payer: Medicare Other | Admitting: Hematology

## 2018-07-18 LAB — PROTEIN ELECTROPHORESIS, SERUM
A/G Ratio: 0.9 (ref 0.7–1.7)
Albumin ELP: 3.8 g/dL (ref 2.9–4.4)
Alpha-1-Globulin: 0.2 g/dL (ref 0.0–0.4)
Alpha-2-Globulin: 1 g/dL (ref 0.4–1.0)
Beta Globulin: 1.3 g/dL (ref 0.7–1.3)
Gamma Globulin: 1.9 g/dL — ABNORMAL HIGH (ref 0.4–1.8)
Globulin, Total: 4.4 g/dL — ABNORMAL HIGH (ref 2.2–3.9)
M-Spike, %: 1.3 g/dL — ABNORMAL HIGH
Total Protein ELP: 8.2 g/dL (ref 6.0–8.5)

## 2018-07-20 DIAGNOSIS — R0902 Hypoxemia: Secondary | ICD-10-CM | POA: Diagnosis not present

## 2018-07-23 ENCOUNTER — Ambulatory Visit (HOSPITAL_COMMUNITY): Payer: Medicare Other | Admitting: Hematology

## 2018-07-29 ENCOUNTER — Inpatient Hospital Stay (HOSPITAL_BASED_OUTPATIENT_CLINIC_OR_DEPARTMENT_OTHER): Payer: Medicare Other | Admitting: Hematology

## 2018-07-29 ENCOUNTER — Other Ambulatory Visit: Payer: Self-pay

## 2018-07-29 ENCOUNTER — Encounter (HOSPITAL_COMMUNITY): Payer: Self-pay | Admitting: Hematology

## 2018-07-29 VITALS — BP 123/72 | HR 94 | Temp 98.3°F | Resp 18 | Wt 204.5 lb

## 2018-07-29 DIAGNOSIS — I10 Essential (primary) hypertension: Secondary | ICD-10-CM | POA: Insufficient documentation

## 2018-07-29 DIAGNOSIS — Z87442 Personal history of urinary calculi: Secondary | ICD-10-CM | POA: Insufficient documentation

## 2018-07-29 DIAGNOSIS — R5383 Other fatigue: Secondary | ICD-10-CM | POA: Diagnosis not present

## 2018-07-29 DIAGNOSIS — K219 Gastro-esophageal reflux disease without esophagitis: Secondary | ICD-10-CM | POA: Insufficient documentation

## 2018-07-29 DIAGNOSIS — C9000 Multiple myeloma not having achieved remission: Secondary | ICD-10-CM

## 2018-07-29 DIAGNOSIS — E785 Hyperlipidemia, unspecified: Secondary | ICD-10-CM

## 2018-07-29 DIAGNOSIS — Z87891 Personal history of nicotine dependence: Secondary | ICD-10-CM | POA: Diagnosis not present

## 2018-07-29 DIAGNOSIS — R110 Nausea: Secondary | ICD-10-CM | POA: Insufficient documentation

## 2018-07-29 DIAGNOSIS — K760 Fatty (change of) liver, not elsewhere classified: Secondary | ICD-10-CM

## 2018-07-29 DIAGNOSIS — K5900 Constipation, unspecified: Secondary | ICD-10-CM | POA: Insufficient documentation

## 2018-07-29 DIAGNOSIS — F329 Major depressive disorder, single episode, unspecified: Secondary | ICD-10-CM | POA: Diagnosis not present

## 2018-07-29 DIAGNOSIS — G629 Polyneuropathy, unspecified: Secondary | ICD-10-CM | POA: Diagnosis not present

## 2018-07-29 DIAGNOSIS — D472 Monoclonal gammopathy: Secondary | ICD-10-CM | POA: Insufficient documentation

## 2018-07-29 DIAGNOSIS — D890 Polyclonal hypergammaglobulinemia: Principal | ICD-10-CM

## 2018-07-29 NOTE — Patient Instructions (Signed)
[facility] Discharge Instructions     Thank you for choosing [facility] at [facility] to provide your oncology and hematology care.  To afford each patient quality time with our provider, please arrive at least 15 minutes before your scheduled appointment time.   If you have a lab appointment with the Cancer Center please come in thru the  Main Entrance and check in at the main information desk  You need to re-schedule your appointment should you arrive 10 or more minutes late.  We strive to give you quality time with our providers, and arriving late affects you and other patients whose appointments are after yours.  Also, if you no show three or more times for appointments you may be dismissed from the clinic at the providers discretion.     Again, thank you for choosing [facility].  Our hope is that these requests will decrease the amount of time that you wait before being seen by our physicians.       _____________________________________________________________  Should you have questions after your visit to [facility], please contact our office at [phone] between the hours of 8:00 a.m. and 4:30 p.m.  Voicemails left after 4:00 p.m. will not be returned until the following business day.  For prescription refill requests, have your pharmacy contact our office and allow 72 hours.    Cancer Center Support Programs:   > Cancer Support Group  2nd Tuesday of the month 1pm-2pm, Journey Room    

## 2018-07-29 NOTE — Assessment & Plan Note (Signed)
1.  IgG kappa smoldering multiple myeloma: - Bone marrow biopsy on 02/19/2017 showing trilineage hematopoiesis with plasma cells of 14%.  Cytogenetics revealed 18, XX.  FISH panel was normal. - Skeletal survey on 03/18/2018 did not reveal any lytic lesions. -We reviewed her blood work from 07/16/2018.  M spike is stable at 1.3 g.  Free light chain ratio has improved to 2.47, 2.90 previously.  Kappa light chains of 43.9, 49 previously.  Calcium was 10 and creatinine was 1.68.  Hemoglobin was 13.7.  LDH was normal. - She does not report any new onset bone pains.  She did have a lower respiratory infection from which she is recovering.  That was the only infection reported in the last 4 months. -I recommended follow-up in 4 months with repeat blood work.  I plan to repeat skeletal survey prior to next visit.

## 2018-07-29 NOTE — Progress Notes (Signed)
[address] [place], [place] [address]   CLINIC:  Medical Oncology/Hematology  PCP:  [name], MD [address], [address] [place] [address] [phone]   REASON FOR VISIT: Follow-up for Smoldering Multiple Myeloma   CURRENT THERAPY: observation   INTERVAL HISTORY:  Cheryl Abbott 73 y.o. female returns for routine follow-up for Smoldering Multiple Myeloma. She is here today with family. She has been doing well since her last visit. She is unable to do many activities due to fatigue. She also has occasional constipation and nausea. Denies any vomiting or diarrhea. Denies any new pains. Had not noticed any recent bleeding such as epistaxis, hematuria or hematochezia. Denies recent chest pain on exertion, shortness of breath on minimal exertion, pre-syncopal episodes, or palpitations. Denies any numbness or tingling in hands or feet. Denies any recent fevers, infections, or recent hospitalizations. Patient reports appetite at 25% and energy level at 25%.   REVIEW OF SYSTEMS:  Review of Systems  Constitutional: Positive for fatigue.  Gastrointestinal: Positive for constipation and nausea.  All other systems reviewed and are negative.    PAST MEDICAL/SURGICAL HISTORY:  Past Medical History:  Diagnosis Date  . Anemia   . Anxiety   . Anxiety disorder   . Arthritis   . Cancer ([place])    acute myeloma  . 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 Chronic back pain   . Depression   . Fatty liver   . GERD (gastroesophageal reflux disease)   . Headache   . History of adenomatous polyp of colon    2008  . History of kidney stones   . Hypercalcemia   . Hyperlipidemia   . Hypertension   . Left ureteral stone   . Lipoma of arm 01/19/2015  . Lumbar disc disease with radiculopathy   . Nephrolithiasis   . Neuropathy, peripheral   . OSA treated with BiPAP    per study 2007  . Sciatica   . Shoulder pain, bilateral   . Sigmoid  diverticulosis   . Type 2 diabetes mellitus ([place])    Past Surgical History:  Procedure Laterality Date  . BIOPSY N/A 03/17/2013   Procedure: GASTRIC BIOPSIES;  Surgeon: Danie Binder, MD;  Location: [facility];  Service: Endoscopy;  Laterality: N/A;  . BIOPSY  06/10/2015   Procedure: BIOPSY;  Surgeon: Danie Binder, MD;  [facility];  Service: Endoscopy;;  gastric biopsy  . CARDIAC CATHETERIZATION  05-11-2003  dr Shelva Majestic ( heart center)   Abnormal cardiolite/   Normal coronary arteries and normal LVF,  ef  63%  . CARDIOVASCULAR STRESS TEST  01-01-2014   normal lexiscan cardiolite/  no ischemia/ infarct/  normal LV function and wall motion ,  ef 81%  . COLONOSCOPY  last one 10/02/2011   MOD [facility] TICS, IH, NEXT TCS APR 2018  . COLONOSCOPY WITH PROPOFOL N/A 02/26/2017   Procedure: COLONOSCOPY WITH PROPOFOL;  Surgeon: Danie Binder, MD;  [facility];  Service: Endoscopy;  Laterality: N/A;  11:00am  . CYSTO/   RIGHT URETEROSOCOPY LASER LITHOTRIPSY STONE EXTRACTION  10-13-2004  . CYSTO/  RIGHT RETROGRADE PYELOGRAM/  PLACMENT RIGHT URETERAL STENT  01-10-2010  . CYSTOSCOPY W/ URETERAL STENT PLACEMENT Right 08/19/2015   Procedure: CYSTOSCOPY WITH RETROGRADE PYELOGRAM/URETERAL STENT PLACEMENT;  Surgeon: Franchot Gallo, MD;  Location: [facility];  Service: Urology;  Laterality: Right;  . CYSTOSCOPY WITH RETROGRADE PYELOGRAM, URETEROSCOPY AND STENT PLACEMENT Left 10/21/2014  Procedure: [place], URETEROSCOPY AND STENT PLACEMENT;  Surgeon: Franchot Gallo, MD;  Location[facility];  Service: Urology;  Laterality: Left;  . CYSTOSCOPY WITH RETROGRADE PYELOGRAM, URETEROSCOPY AND STENT PLACEMENT Right 11/22/2015   Procedure: CYSTOSCOPY, RIGHT URETERAL STENT REMOVAL; RIGHT RETROGRADE PYELOGRAM, RIGHT URETEROSCOPY WITH BALLOON DILATION; RIGHT URETERAL STENT PLACEMENT;  Surgeon: Franchot Gallo, MD;  Location: [facility];  Service:  Urology;  Laterality: Right;  . CYSTOSCOPY/RETROGRADE/URETEROSCOPY/STONE EXTRACTION WITH BASKET Bilateral 08/02/2015   Procedure: CYSTOSCOPY; BILATERAL RETROGRADE PYELOGRAMS; BILATERAL URETEROSCOPY, STONE EXTRACTION WITH BASKET;  Surgeon: Franchot Gallo, MD;  Location: [facility];  Service: Urology;  Laterality: Bilateral;  . CYSTOSCOPY/RETROGRADE/URETEROSCOPY/STONE EXTRACTION WITH BASKET Right 06/28/2015   Procedure: CYSTOSCOPY/RETROGRADE/URETEROSCOPY/STONE EXTRACTION WITH BASKET, RIGHT URETERAL DOUBLE J STENT PLACEMENT;  Surgeon: Franchot Gallo, MD;  Location: [facility];  Service: Urology;  Laterality: Right;  . ESOPHAGOGASTRODUODENOSCOPY (EGD) WITH PROPOFOL N/A 03/17/2013   Procedure: ESOPHAGOGASTRODUODENOSCOPY (EGD) WITH PROPOFOL;  Surgeon: Danie Binder, MD;  Location: [facility];  Service: Endoscopy;  Laterality: N/A;  . ESOPHAGOGASTRODUODENOSCOPY (EGD) WITH PROPOFOL N/A 06/10/2015   Distal gastritis, distal esophageal stricture s/p dilation  . ESOPHAGOGASTRODUODENOSCOPY (EGD) WITH PROPOFOL N/A 02/26/2017   Procedure: ESOPHAGOGASTRODUODENOSCOPY (EGD) WITH PROPOFOL;  Surgeon: Danie Binder, MD;  [facility];  Service: Endoscopy;  Laterality: N/A;  . FLEXIBLE SIGMOIDOSCOPY  09/11/2011   KGY:[id number] Internal hemorrhoids  . HOLMIUM LASER APPLICATION Left 12/10/6376   Procedure: HOLMIUM LASER APPLICATION;  Surgeon: Franchot Gallo, MD;  Location: [facility];  Service: Urology;  Laterality: Left;  . HOLMIUM LASER APPLICATION Right 5/88/5027   Procedure: HOLMIUM LASER APPLICATION;  Surgeon: Franchot Gallo, MD;  Location: [facility];  Service: Urology;  Laterality: Right;  . MASS EXCISION Left 03/04/2015   Procedure: EXCISION OF SOFT TISSUE NEOPLASM LEFT ARM;  Surgeon: Aviva Signs, MD;  Location: [facility];  Service: General;  Laterality: Left;  . PERCUTANEOUS NEPHROSTOLITHOTOMY Bilateral 12/  2011     [facility]  . POLYPECTOMY N/A 03/17/2013   Procedure: GASTRIC POLYPECTOMY;   Surgeon: Danie Binder, MD;  Location: [facility];  Service: Endoscopy;  Laterality: N/A;  . POLYPECTOMY  02/26/2017   Procedure: POLYPECTOMY;  Surgeon: Danie Binder, MD;  [facility];  Service: Endoscopy;;  polyp at cecum, ascending colon polyps x3, hepatic flexure polyps x8, transverse colon polyps x8   . REMOVAL RIGHT THIGH CYST  2006  . SAVORY DILATION N/A 03/17/2013   Procedure: SAVORY DILATION;  Surgeon: Danie Binder, MD;  Location: [facility];  Service: Endoscopy;  Laterality: N/A;  #12.8, 14, 15, 16 dilators used  . SAVORY DILATION N/A 06/10/2015   Procedure: SAVORY DILATION;  Surgeon: Danie Binder, MD;  [facility];  Service: Endoscopy;  Laterality: N/A;  . TRANSTHORACIC ECHOCARDIOGRAM  01-01-2014   mild LVH/  ef 74-12%/  grade I diastolic dysfunction/  trivial MR, TR, and PR  . VAGINAL HYSTERECTOMY  1970's     SOCIAL HISTORY:  Social History   Socioeconomic History  . Marital status: Divorced    Spouse name: Not on file  . Number of children: 2  . Years of education: Not on file  . Highest education level: Not on file  Occupational History  . Occupation: retired   Scientific laboratory technician  . Financial resource strain: Not very hard  . Food insecurity:    Worry: Never true    Inability: Never true  . Transportation needs:    Medical: No  Non-medical: No  Tobacco Use  . Smoking status: Former Smoker    Packs/day: 1.00    Years: 20.00    Pack years: 20.00    Types: Cigarettes    Start date: 09/21/1974    Last attempt to quit: 09/20/1988    Years since quitting: 29.8  . Smokeless tobacco: Never Used  . Tobacco comment: Quit x 20 years  Substance and Sexual Activity  . Alcohol use: No    Alcohol/week: 0.0 standard drinks  . Drug use: No  . Sexual activity: Never    Birth control/protection: Surgical  Lifestyle  . Physical activity:    Days per week: 0 days    Minutes per session: 0 min  . Stress: Only a little  Relationships  . Social  connections:    Talks on phone: Not on file    Gets together: Not on file    Attends religious service: Not on file    Active member of club or organization: Not on file    Attends meetings of clubs or organizations: Not on file    Relationship status: Divorced  . Intimate partner violence:    Fear of current or ex partner: No    Emotionally abused: No    Physically abused: No    Forced sexual activity: No  Other Topics Concern  . Not on file  Social History Narrative   Patient is right handed   Patient drinks some caffeine daily.    FAMILY HISTORY:  Family History  Problem Relation Age of Onset  . Hypertension Mother   . Diabetes Mother   . Heart failure Mother   . Dementia Mother   . Emphysema Father   . Hypertension Father   . Diabetes Brother   . GER disease Brother   . Hypertension Sister   . Hypertension Sister   . Cancer Other        family history   . Diabetes Other        family history   . Heart defect Other        famiily history   . Arthritis Other        family history   . Anesthesia problems Neg Hx   . Hypotension Neg Hx   . Malignant hyperthermia Neg Hx   . Pseudochol deficiency Neg Hx   . Colon cancer Neg Hx     CURRENT MEDICATIONS:  Outpatient Encounter Medications as of 07/29/2018  Medication Sig  . ALPRAZolam (XANAX) 0.5 MG tablet Take 1 tablet (0.5 mg total) by mouth at bedtime.  Marland Kitchen aspirin EC 325 MG tablet Take 325 mg by mouth daily.  . diphenhydramine-acetaminophen (TYLENOL PM) 25-500 MG TABS tablet Take 2 tablets by mouth at bedtime.  . docusate sodium (COLACE) 100 MG capsule Take 200 mg by mouth at bedtime.   . iron polysaccharides (NIFEREX) 150 MG capsule Take 150 mg by mouth daily.  Marland Kitchen KLOR-CON M20 20 MEQ tablet TAKE 1 TABLET BY MOUTH EVERY DAY  . lovastatin (MEVACOR) 40 MG tablet Take 1 tablet (40 mg total) by mouth at bedtime.  . OXYGEN Inhale into the lungs daily. Bedtime  . pantoprazole (PROTONIX) 40 MG tablet TAKE 1 TABLET (40 MG  TOTAL) BY MOUTH 2 (TWO) TIMES DAILY.  Marland Kitchen PARoxetine (PAXIL) 40 MG tablet TAKE 1 TABLET BY MOUTH EVERY DAY IN THE MORNING  . potassium chloride (K-DUR) 10 MEQ tablet Take 1 tablet (10 mEq total) by mouth daily.  . SYMBICORT 80-4.5 MCG/ACT inhaler INHALE  2 PUFFS INTO THE LUNGS TWICE A DAY  . tiZANidine (ZANAFLEX) 2 MG tablet TAKE 1 TABLET BY MOUTH TWICE A DAY AS NEEDED FOR MUSCLE SPASM  . TRADJENTA 5 MG TABS tablet TAKE 1 TABLET BY MOUTH EVERY DAY  . traMADol (ULTRAM) 50 MG tablet Take 1 tablet (50 mg total) by mouth every 6 (six) hours as needed.  . traZODone (DESYREL) 100 MG tablet TAKE 1 TABLET BY MOUTH EVERYDAY AT BEDTIME  . amLODipine (NORVASC) 10 MG tablet TAKE 1 TABLET BY MOUTH EVERY DAY (Patient taking differently: Take 10 mg by mouth daily. )   No facility-administered encounter medications on file as of 07/29/2018.     ALLERGIES:  Allergies  Allergen Reactions  . Ace Inhibitors Cough  . Keflex [Cephalexin] Diarrhea and Nausea And Vomiting  . Nitrofurantoin Diarrhea and Nausea And Vomiting  . Penicillins Hives and Other (See Comments)    Reaction:  Blisters on hands/feet  Has patient had a PCN reaction causing immediate rash, facial/tongue/throat swelling, SOB or lightheadedness with hypotension: No Has patient had a PCN reaction causing severe rash involving mucus membranes or skin necrosis: No Has patient had a PCN reaction that required hospitalization No Has patient had a PCN reaction occurring within the last 10 years: No If all of the above answers are "NO", then may proceed with Cephalosporin use.     PHYSICAL EXAM:  ECOG Performance status: 1  Vitals:   07/29/18 0900  BP: 123/72  Pulse: 94  Resp: 18  Temp: 98.3 F (36.8 C)  SpO2: 94%   Filed Weights   07/29/18 0900  Weight: 204 lb 8 oz (92.8 kg)    Physical Exam Constitutional:      Appearance: Normal appearance. She is normal weight.  Cardiovascular:     Rate and Rhythm: Normal rate and regular rhythm.       Heart sounds: Normal heart sounds.  Pulmonary:     Effort: Pulmonary effort is normal.     Breath sounds: Normal breath sounds.  Musculoskeletal: Normal range of motion.  Skin:    General: Skin is warm and dry.  Neurological:     Mental Status: She is alert and oriented to person, place, and time. Mental status is at baseline.  Psychiatric:        Mood and Affect: Mood normal.        Behavior: Behavior normal.        Thought Content: Thought content normal.        Judgment: Judgment normal.      LABORATORY DATA:  I have reviewed the labs as listed.  CBC    Component Value Date/Time   WBC 6.7 07/16/2018 1123   RBC 4.27 07/16/2018 1123   HGB 13.7 07/16/2018 1123   HCT 42.5 07/16/2018 1123   PLT 261 07/16/2018 1123   MCV 99.5 07/16/2018 1123   MCH 32.1 07/16/2018 1123   MCHC 32.2 07/16/2018 1123   RDW 14.4 07/16/2018 1123   LYMPHSABS 0.7 07/16/2018 1123   MONOABS 1.0 07/16/2018 1123   EOSABS 0.2 07/16/2018 1123   BASOSABS 0.0 07/16/2018 1123   CMP Latest Ref Rng & Units 07/16/2018 07/16/2018 05/21/2018  Glucose 70 - 99 mg/dL 101(H) 95 109(H)  BUN 8 - 23 mg/dL 16 17 18   Creatinine 0.44 - 1.00 mg/dL 1.68(H) 1.75(H) 1.72(H)  Sodium 135 - 145 mmol/L 135 133(L) 138  Potassium 3.5 - 5.1 mmol/L 3.9 4.2 5.0  Chloride 98 - 111 mmol/L 100 102 104  CO2  22 - 32 mmol/L 23 23 25   Calcium 8.9 - 10.3 mg/dL 10.0 9.8 10.3  Total Protein 6.5 - 8.1 g/dL 8.5(H) 8.3(H) 8.9(H)  Total Bilirubin 0.3 - 1.2 mg/dL 0.3 0.4 0.3  Alkaline Phos 38 - 126 U/L 62 58 -  AST 15 - 41 U/L 32 33 24  ALT 0 - 44 U/L 23 24 24        DIAGNOSTIC IMAGING:  I have independently reviewed the scans and discussed with the patient.   I have reviewed Francene Finders, NP's note and agree with the documentation.  I personally performed a face-to-face visit, made revisions and my assessment and plan is as follows.    ASSESSMENT & PLAN:   Smoldering myeloma ([facility]) 1.  IgG kappa smoldering multiple myeloma: -  Bone marrow biopsy on 02/19/2017 showing trilineage hematopoiesis with plasma cells of 14%.  Cytogenetics revealed 27, XX.  FISH panel was normal. - Skeletal survey on 03/18/2018 did not reveal any lytic lesions. -We reviewed her blood work from 07/16/2018.  M spike is stable at 1.3 g.  Free light chain ratio has improved to 2.47, 2.90 previously.  Kappa light chains of 43.9, 49 previously.  Calcium was 10 and creatinine was 1.68.  Hemoglobin was 13.7.  LDH was normal. - She does not report any new onset bone pains.  She did have a lower respiratory infection from which she is recovering.  That was the only infection reported in the last 4 months. -I recommended follow-up in 4 months with repeat blood work.  I plan to repeat skeletal survey prior to next visit.      Orders placed this encounter:  Orders Placed This Encounter  Procedures  . DG Bone Survey Met  . Lactate dehydrogenase  . Protein electrophoresis, serum  . Kappa/lambda light chains  . CBC with Differential/Platelet  . Comprehensive metabolic panel      Derek Jack, MD [place] [phone]

## 2018-08-06 IMAGING — US US RENAL
1 series · 14 of 25 positions shown · non-contrast
Comparison: KUB September 17, 2016

CLINICAL DATA: Kidney stones, renal insufficiency.

EXAM:
RENAL / URINARY TRACT ULTRASOUND COMPLETE

[Series 1: us renal · 0.28mm/px · 14 of 63 slices shown]
[im 1/63]
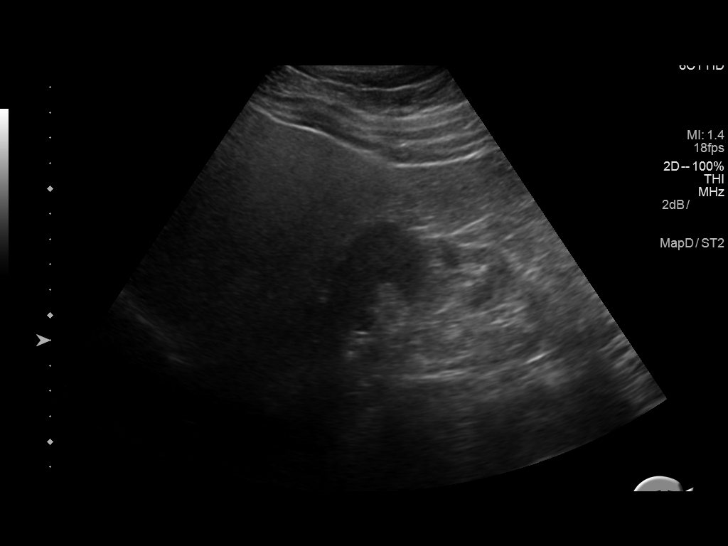
[im 6/63]
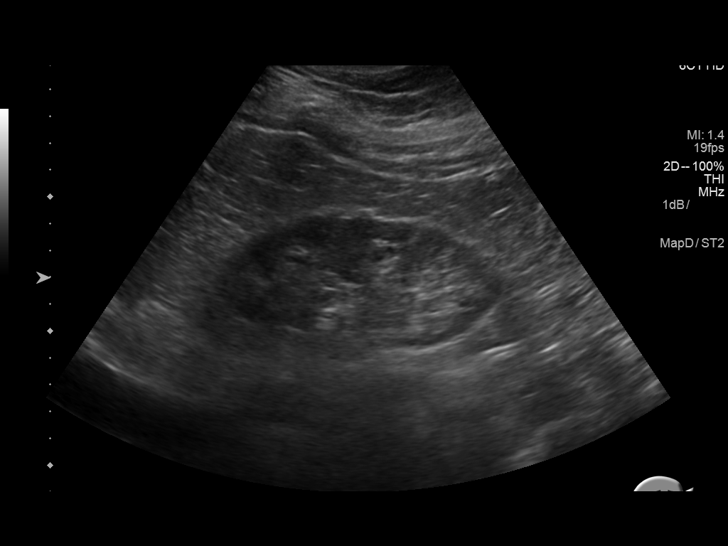
[im 11/63]
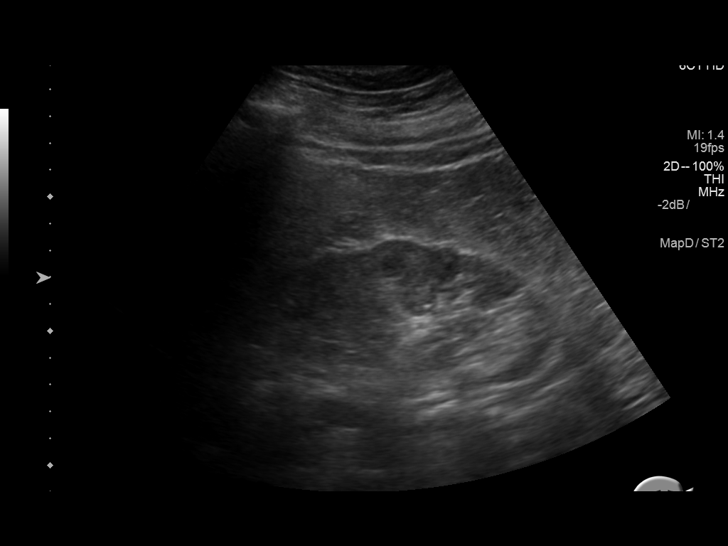
[im 16/63]
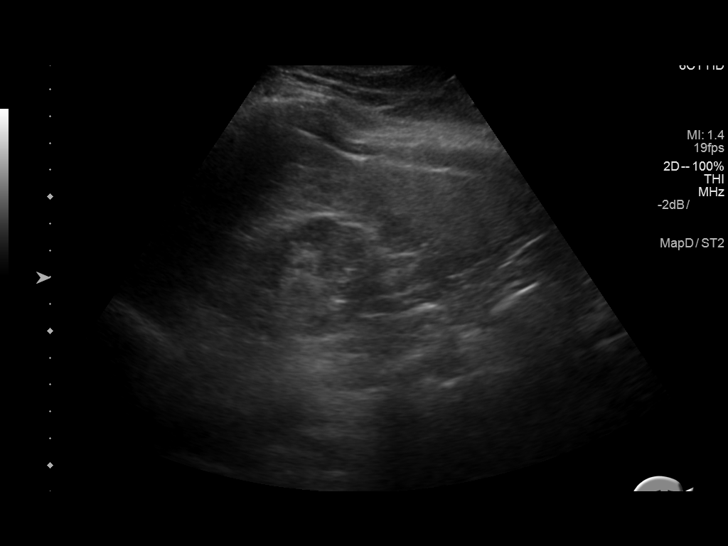
[im 21/63]
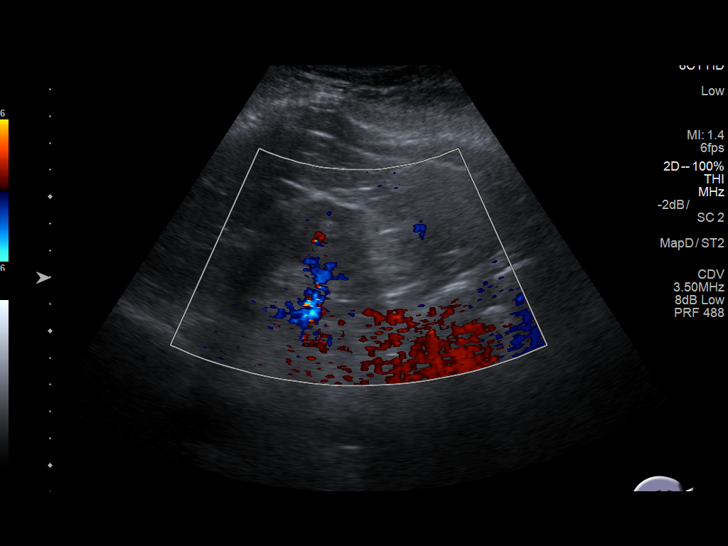
[im 24/63]
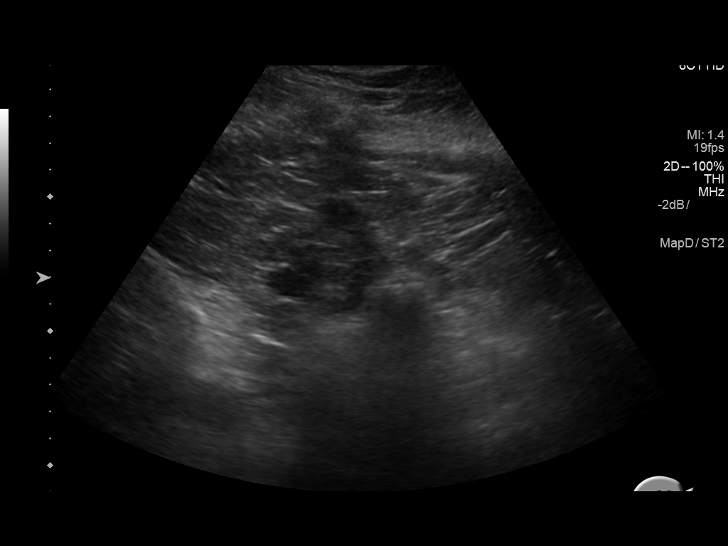
[im 29/63]
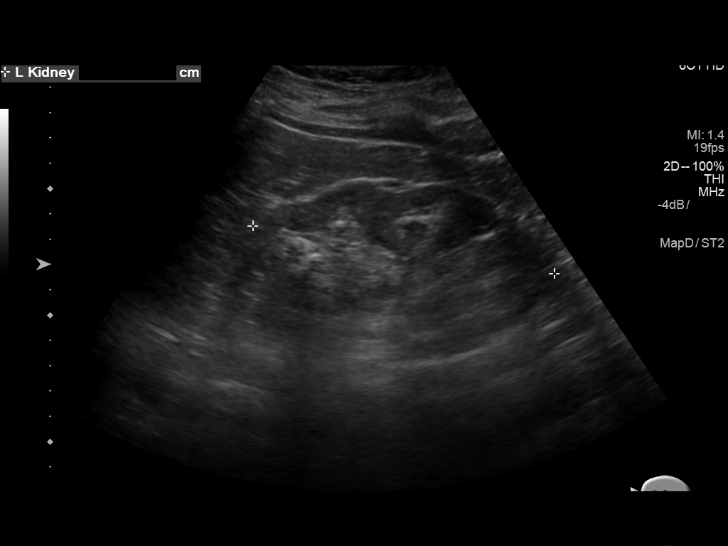
[im 34/63]
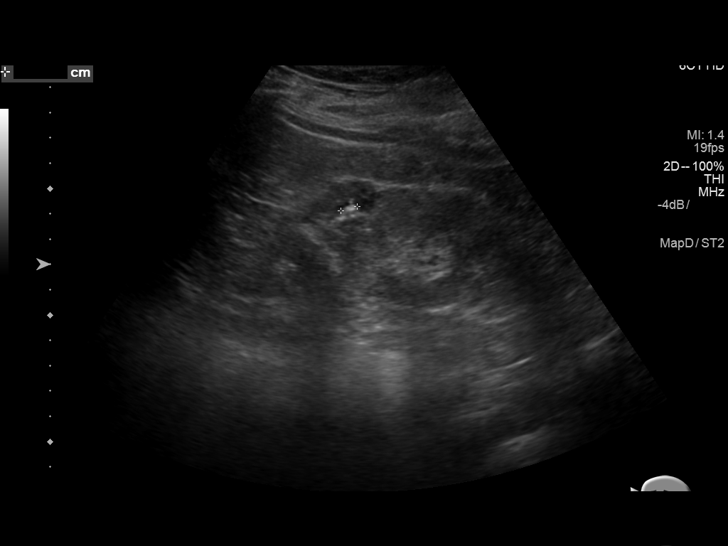
[im 39/63]
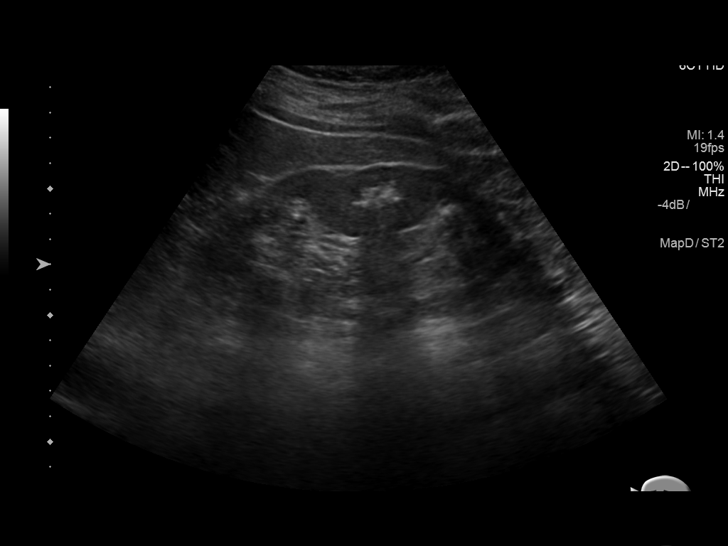
[im 42/63]
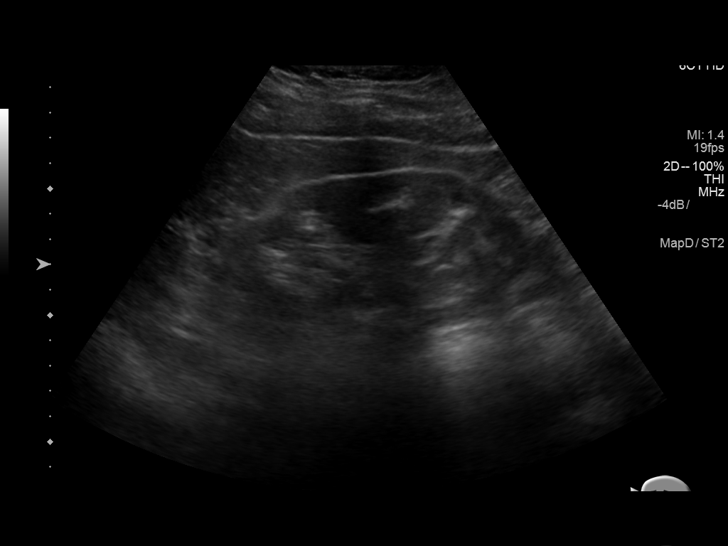
[im 47/63]
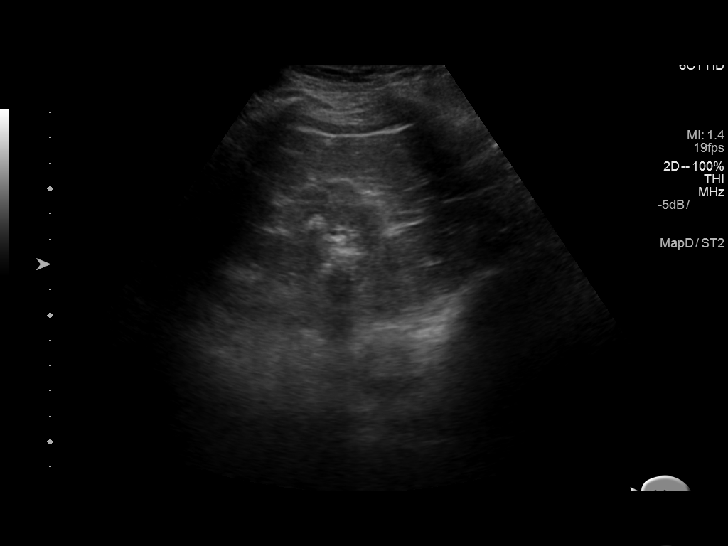
[im 52/63]
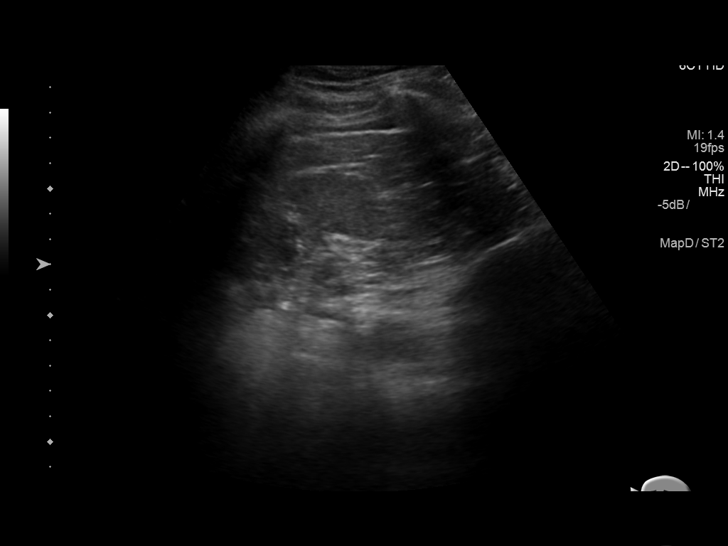
[im 57/63]
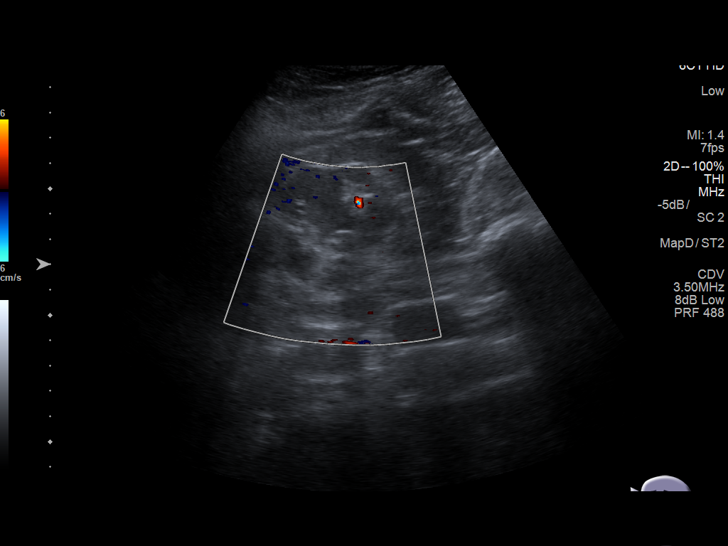
[im 63/63]
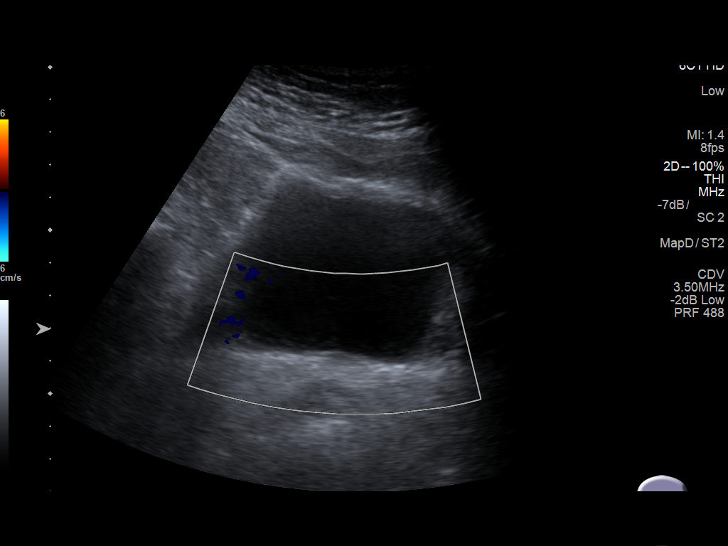

[14 of 25 positions shown; findings below may reference images not displayed]

FINDINGS: Right Kidney:

Length: 10.8 cm. The renal cortical echotexture is subjectively
mildly increased but remains lower than that of the adjacent liver.
There is no hydronephrosis. No cystic nor solid mass is observed.

Left Kidney:

Length: 12.2 cm. The cortical echotexture is mildly increased
similar to that on the right. There are multiple echogenic shadowing
stones not producing obstruction noted throughout the kidney.

Bladder:

The partially distended urinary bladder is normal. Ureteral jets
were not observed.
IMPRESSION: Multiple nonobstructing stones in the left kidney. Neither kidney
exhibits hydronephrosis.

Mildly increased renal cortical echotexture bilaterally is
consistent with medical renal disease.

The partially distended urinary bladder is unremarkable.

## 2018-08-06 IMAGING — CT CT HEAD CODE STROKE
3 of 4 series · 15 of 47 positions shown, 18 images · non-contrast
Comparison: 08/18/2015 CT head.

CLINICAL DATA: Code stroke.  Left-sided facial numbness.

EXAM:
CT HEAD WITHOUT CONTRAST
TECHNIQUE: Contiguous axial images were obtained from the base of the skull
through the vertex without intravenous contrast.

[Series 2: head w/o · axial · non-contrast · 0.46mm/px · z∈[+1190,+1344]mm · 9 of 37 slices shown, 12 images]
[im 3/37  brain]
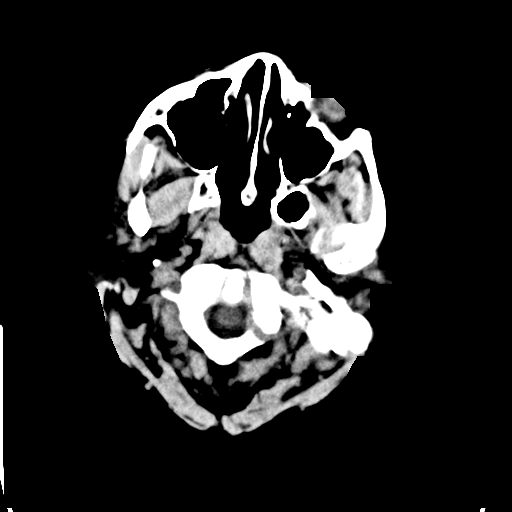
[im 3/37  bone]
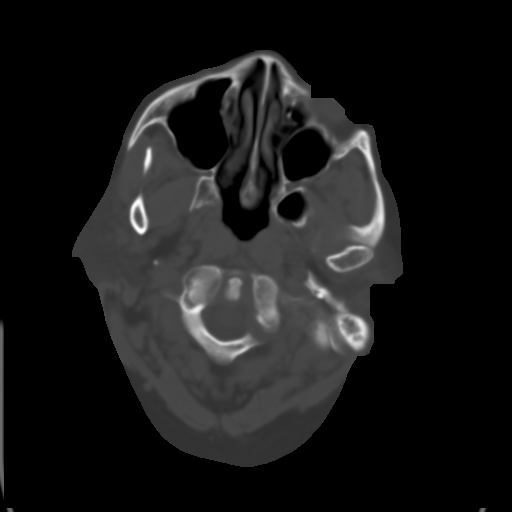
[im 8/37  brain]
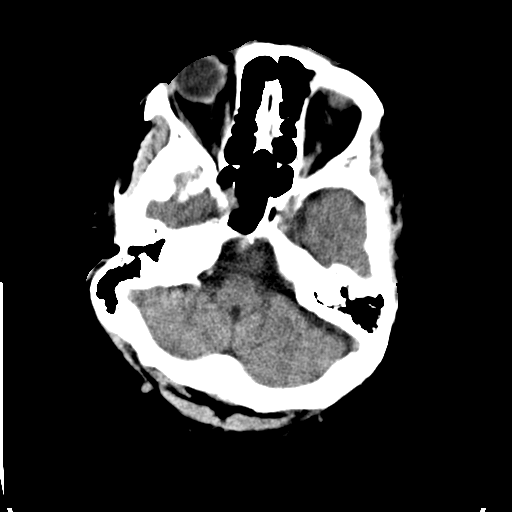
[im 11/37  brain]
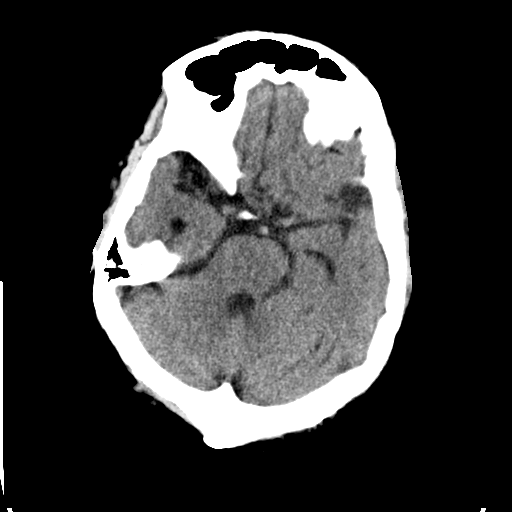
[im 16/37  brain]
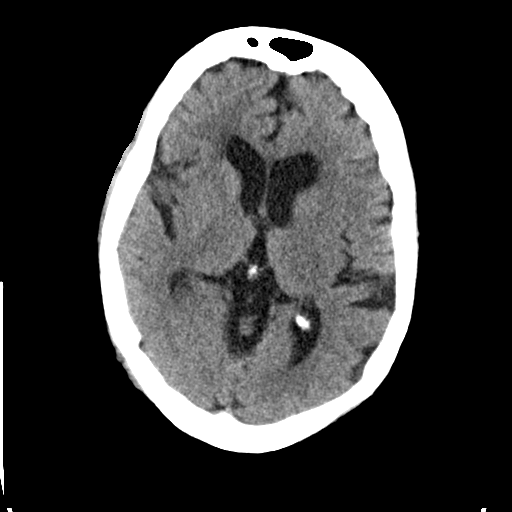
[im 19/37  brain]
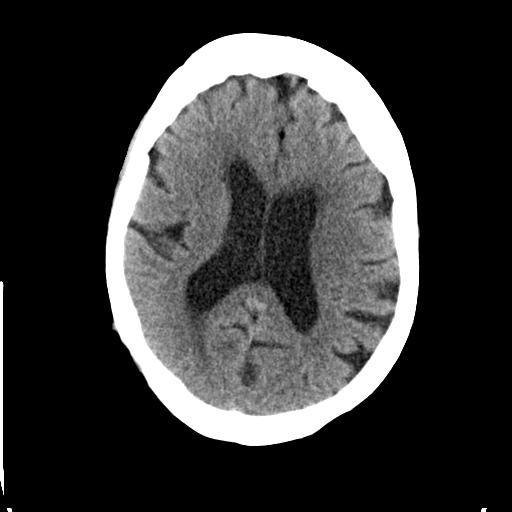
[im 19/37  bone]
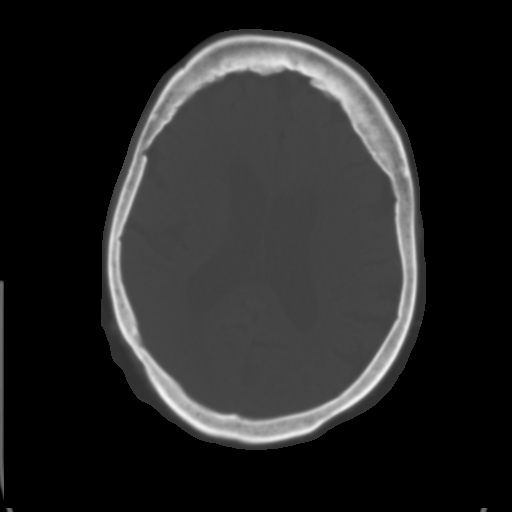
[im 21/37  brain]
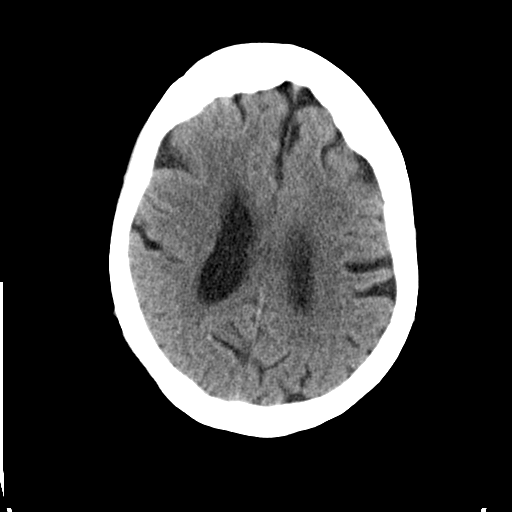
[im 26/37  brain]
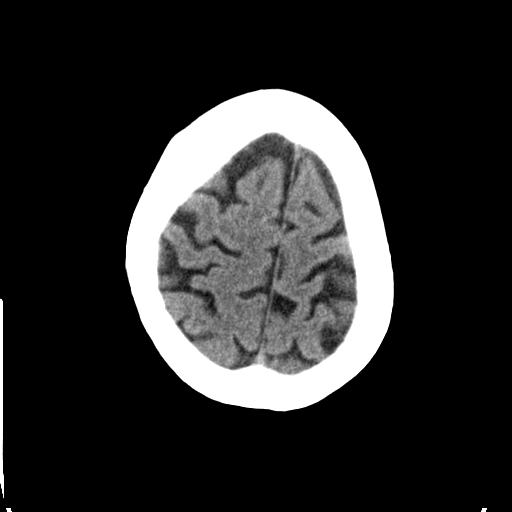
[im 29/37  brain]
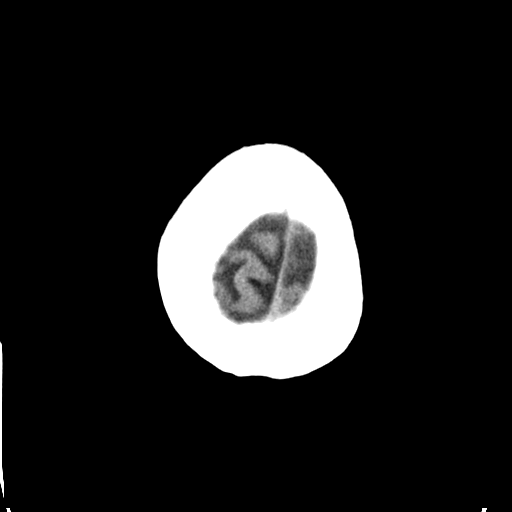
[im 34/37  brain]
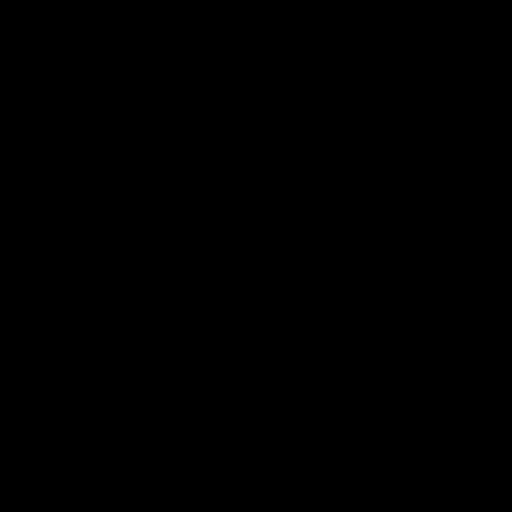
[im 34/37  bone]
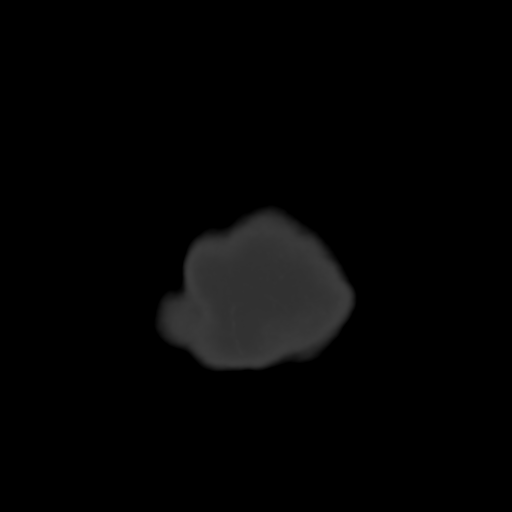

[Series 4: coronal · coronal · 0.33mm/px · 3 of 69 slices shown]
[im 23/69  brain]
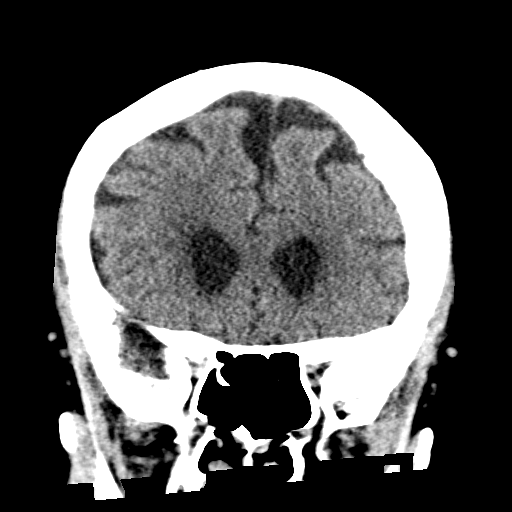
[im 31/69  brain]
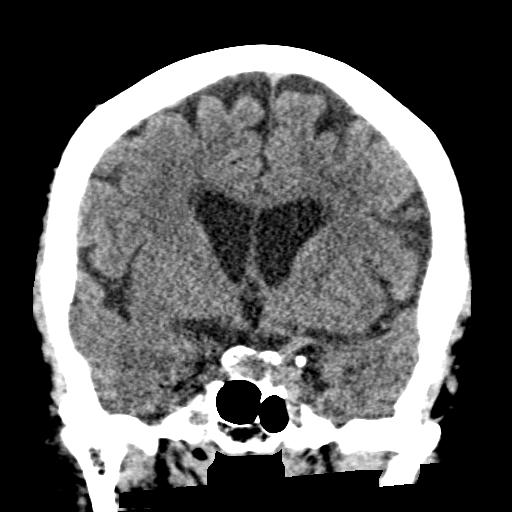
[im 38/69  brain]
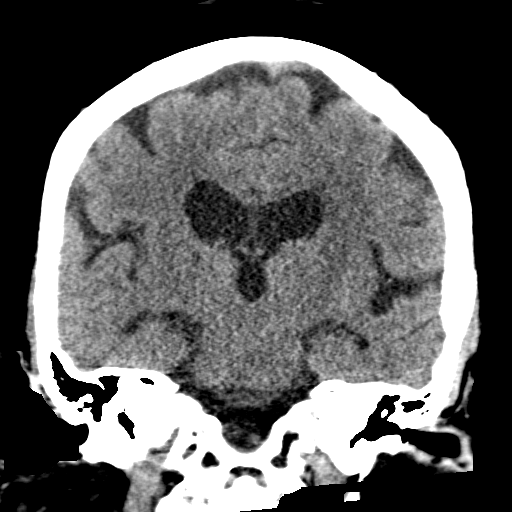

[Series 5: sagittal · sagittal · 0.37mm/px · 3 of 52 slices shown]
[im 18/52  brain]
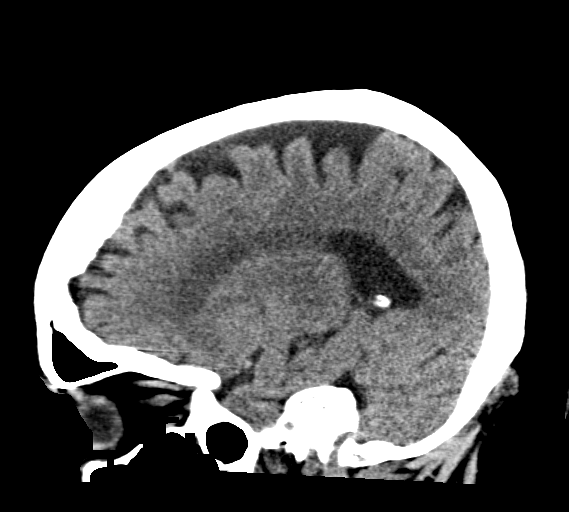
[im 26/52  brain]
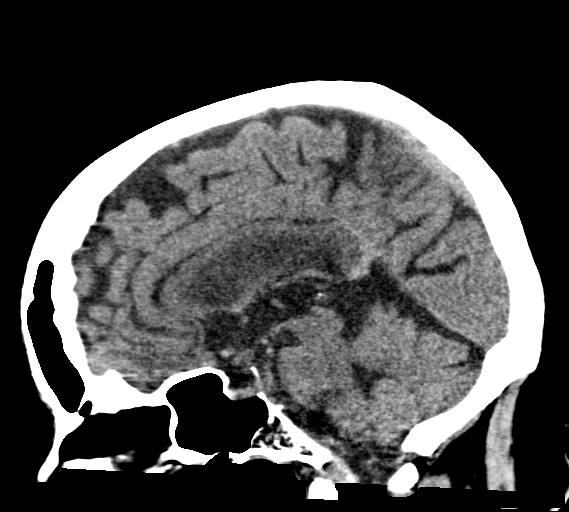
[im 35/52  brain]
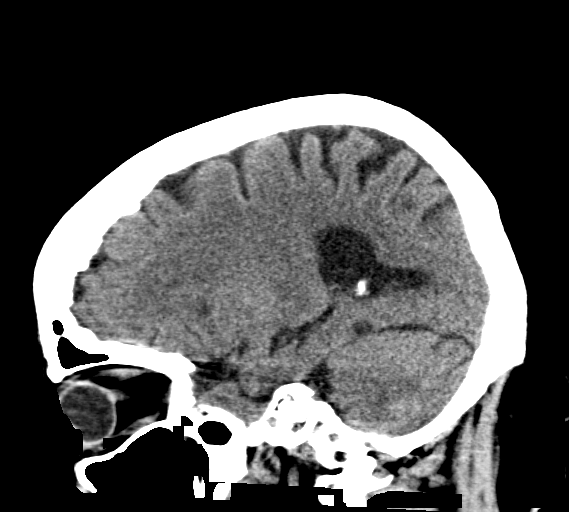

[15 of 47 positions shown; findings below may reference images not displayed]

FINDINGS: No evidence of large acute infarct, focal mass effect, intracranial
hemorrhage, hydrocephalus, or extra-axial collection. Stable lucency
within the right anterior lentiform nucleus extending into right
frontal periventricular white matter probably representing an old
infarct. Mild parenchymal volume loss and chronic microvascular
ischemic changes.

No hyperdense vessel identified. Mild calcific atherosclerosis of
cavernous internal carotid arteries.

Visualized paranasal sinuses, mastoid air cells, and orbits are
unremarkable. No displaced calvarial fracture or suspicious osseous
lesion. Nonspecific dural calcifications along the anterior medial
aspects of left middle cranial fossa.

ASPECTS ([place]berta Stroke Program Early CT Score)

- Ganglionic level infarction (caudate, lentiform nuclei, internal
capsule, insula, M1-M3 cortex): 7

- Supraganglionic infarction (M4-M6 cortex): 3

Total score (0-10 with 10 being normal): 10
IMPRESSION: 1. No acute intracranial abnormality is identified. If symptoms
persist or if clinically indicated MRI is more sensitive for acute
stroke.
2. ASPECTS is 10
3. Stable chronic infarct within right anterior basal ganglia
extending into right frontal periventricular white matter,
parenchymal volume loss, microvascular ischemic changes.
These results were called by telephone at the time of interpretation
on 02/25/2016 at [DATE] to Dr. VIEJO XAVI BALDERRAMO , who verbally
acknowledged these results.

By: Fjako Davisnew M.D.

## 2018-08-07 IMAGING — MR MR HEAD W/O CM
7 of 10 series · 32 of 48 positions shown · non-contrast
Comparison: CT head 02/25/2016.

CLINICAL DATA: Possible TIA. LEFT-sided lip numbness and headache,
with normal neurologic exam. Stroke risk factors include
hypertension, diabetes mellitus, and hyperlipidemia. Initial
encounter.

EXAM:
MRI HEAD WITHOUT CONTRAST
MRA HEAD WITHOUT CONTRAST
TECHNIQUE: Multiplanar, multiecho pulse sequences of the brain and surrounding
structures were obtained without intravenous contrast. Angiographic
images of the head were obtained using MRA technique without
contrast.

[Series 1: DWI · axial · 3.0mm · 0.74mm/px · z∈[-47,+113]mm · 6 of 55 slices shown (1 of 4)]
[im 1/55]
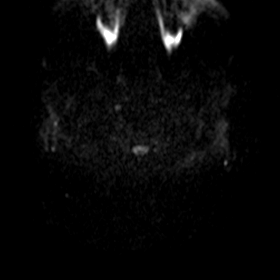
[im 11/55]
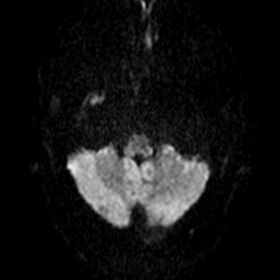
[im 22/55]
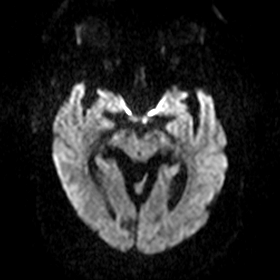
[im 33/55]
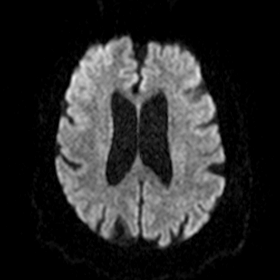
[im 44/55]
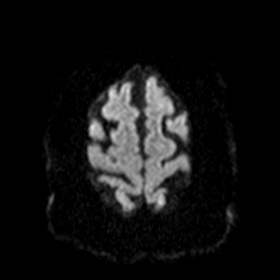
[im 55/55]
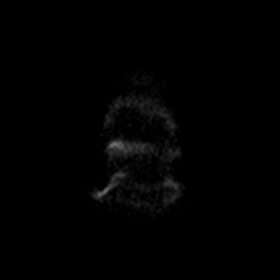

[Series 2: DWI · axial · 3.0mm · 0.74mm/px · z∈[-47,+113]mm · 7 of 55 slices shown (2 of 4)]
[im 1/55]
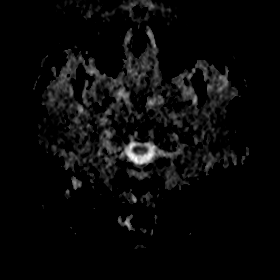
[im 10/55]
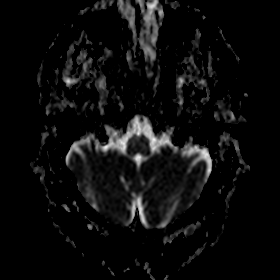
[im 19/55]
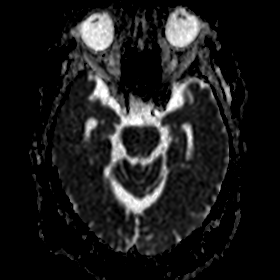
[im 28/55]
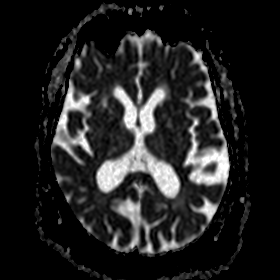
[im 37/55]
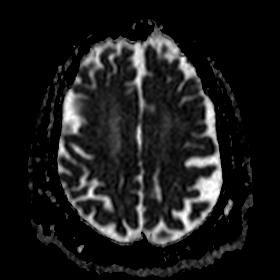
[im 46/55]
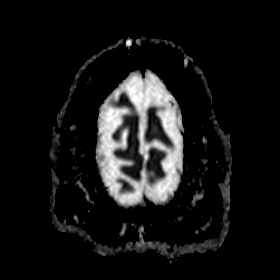
[im 55/55]
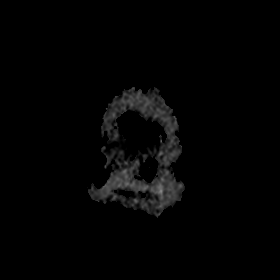

[Series 3: DWI · coronal · 5.0mm · 0.47mm/px · 5 of 37 slices shown (3 of 4)]
[im 1/37]
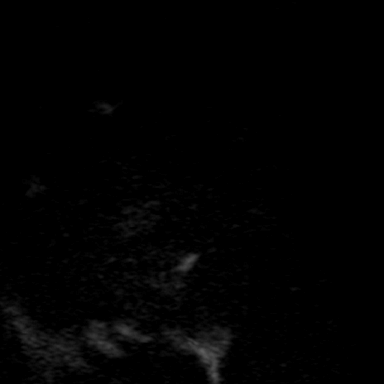
[im 10/37]
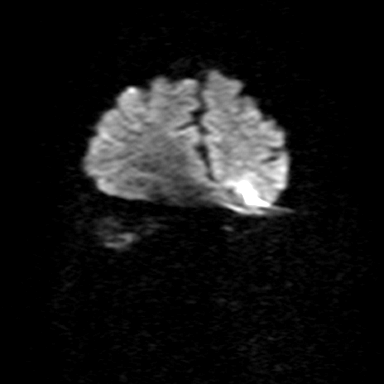
[im 19/37]
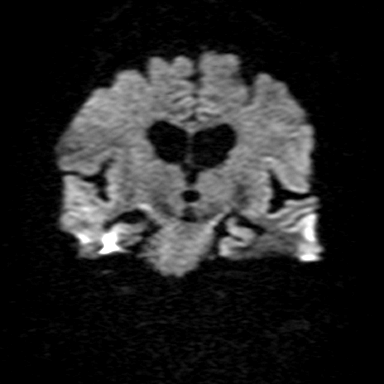
[im 28/37]
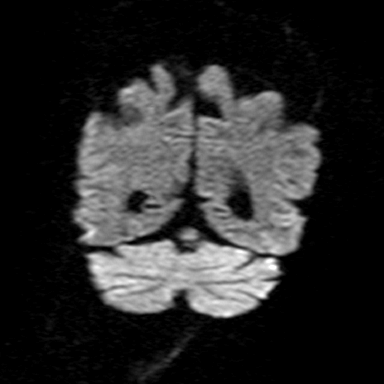
[im 37/37]
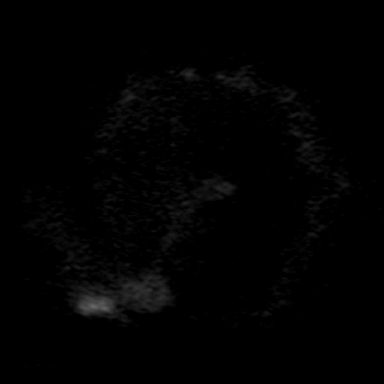

[Series 4: DWI · coronal · 5.0mm · 0.47mm/px · 5 of 38 slices shown (4 of 4)]
[im 1/38]
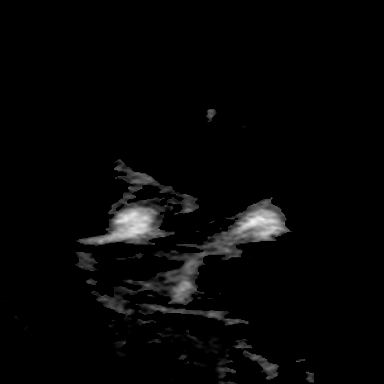
[im 10/38]
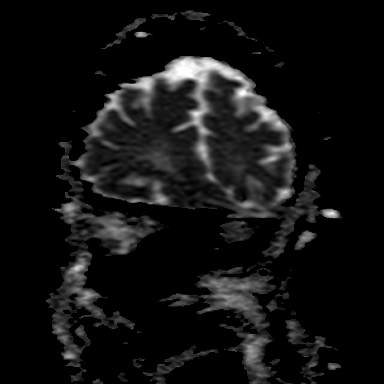
[im 19/38]
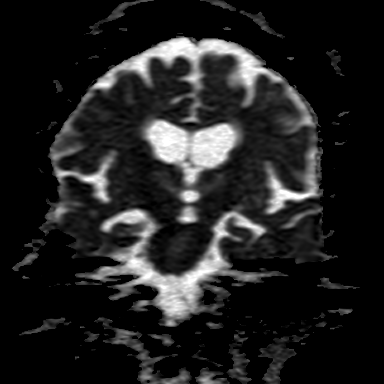
[im 28/38]
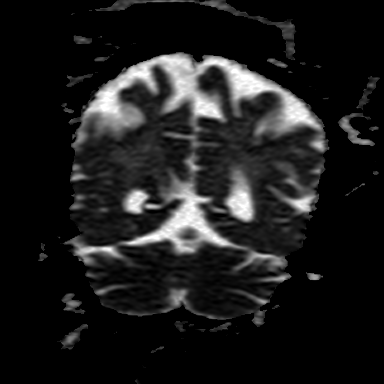
[im 38/38]
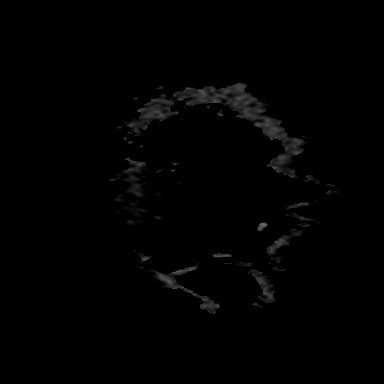

[Series 6: T2 · axial · 5.0mm · 0.51mm/px · z∈[-44,+98]mm · 3 of 23 slices shown (1 of 2)]
[im 1/23]
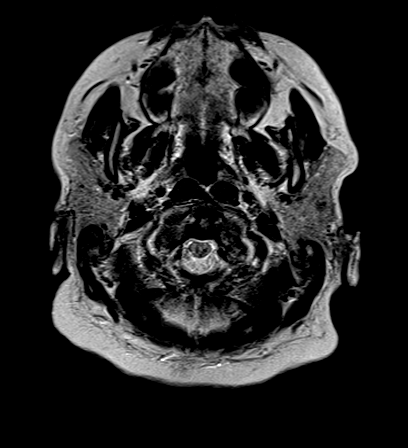
[im 12/23]
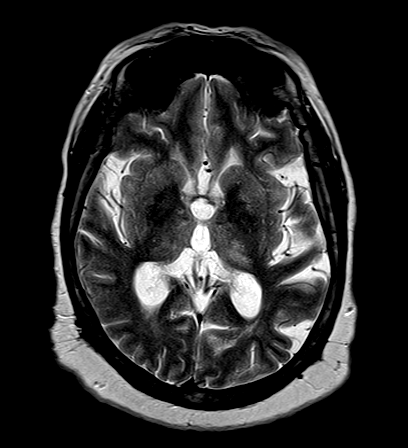
[im 23/23]
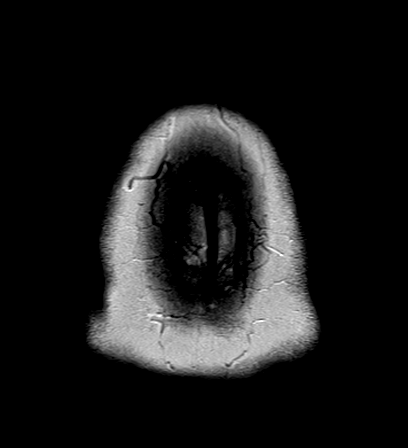

[Series 7: FLAIR · axial · 5.0mm · 0.36mm/px · z∈[-44,+98]mm · 3 of 23 slices shown]
[im 1/23]
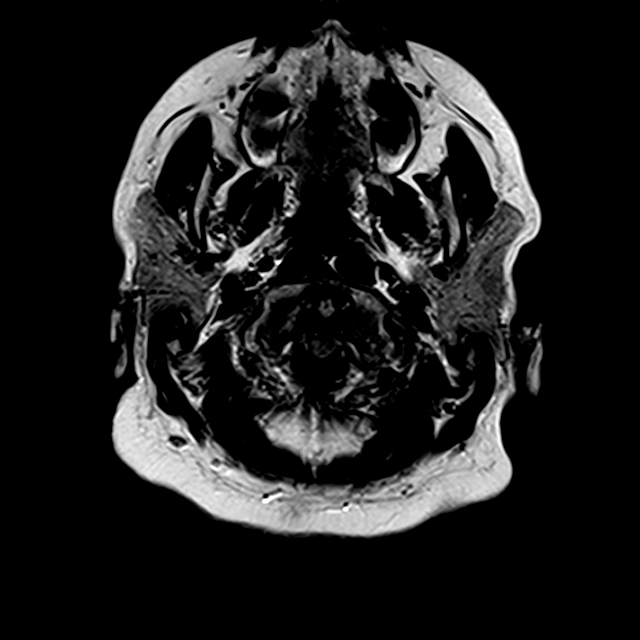
[im 12/23]
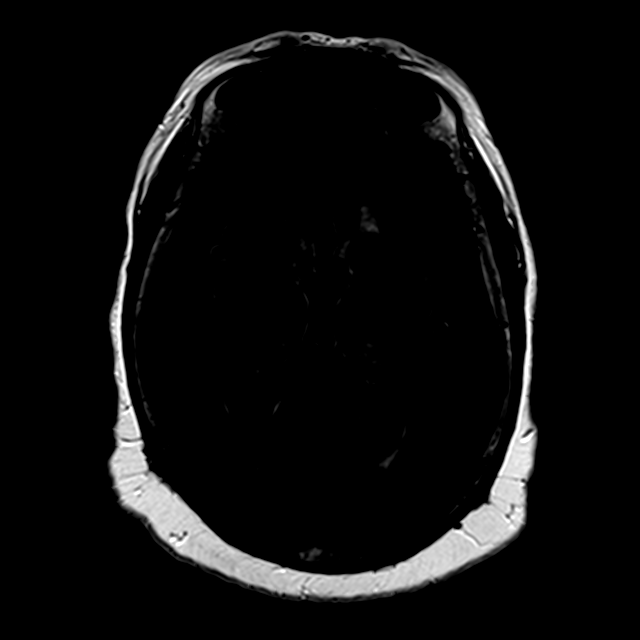
[im 23/23]
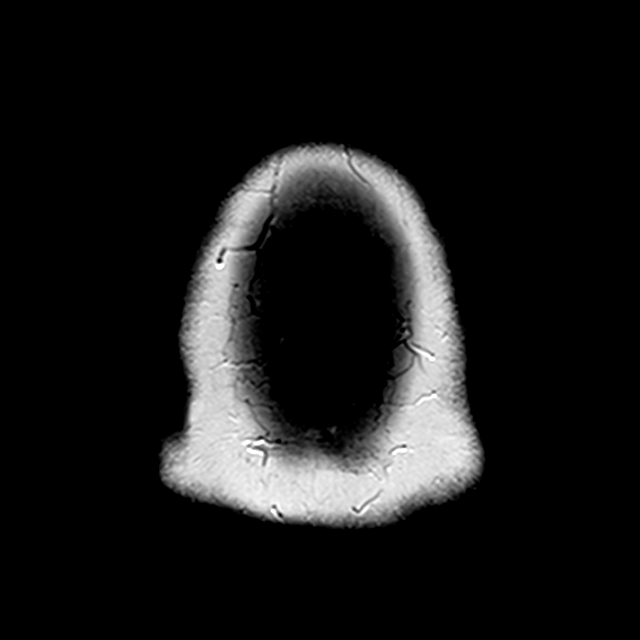

[Series 10: T2 · coronal · 5.0mm · 0.46mm/px · 3 of 28 slices shown (2 of 2)]
[im 1/28]
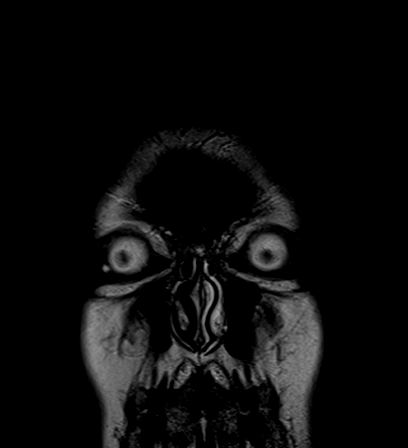
[im 10/28]
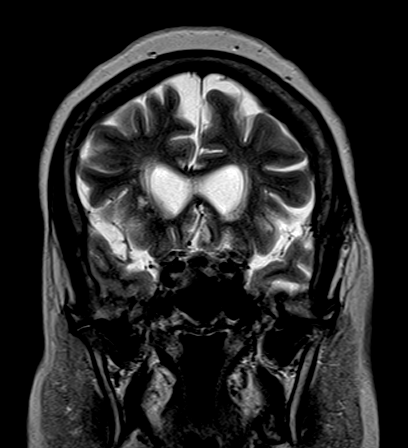
[im 19/28]
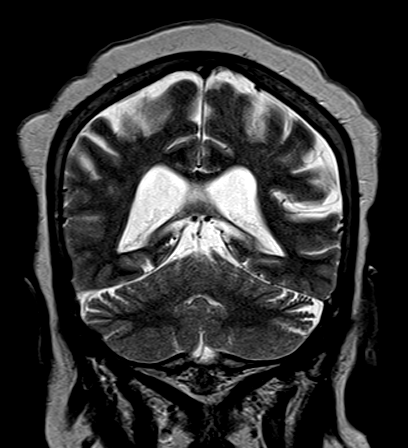

[32 of 48 positions shown; findings below may reference images not displayed]

FINDINGS: MRI HEAD FINDINGS

No evidence for acute infarction, hemorrhage, mass lesion,
hydrocephalus, or extra-axial fluid. Global atrophy. Hydrocephalus
ex vacuo. Moderate T2 and FLAIR hyperintensities throughout the
white matter, also involving the brainstem, representing chronic
microvascular ischemic change.

Flow voids are maintained throughout the carotid, basilar, and
vertebral arteries. There are no areas of chronic hemorrhage.

Unremarkable visualized calvarium, skullbase, and cervical
vertebrae. Pituitary, pineal, cerebellar tonsils unremarkable. No
upper cervical cord lesions.

Visualized calvarium, skull base, and upper cervical osseous
structures unremarkable. Scalp and extracranial soft tissues,
orbits, sinuses, and mastoids show no acute process.

MRA HEAD FINDINGS

Internal carotid arteries are dolichoectatic. Severe tandem
stenoses, estimated 75-90%, are noted of the vertical petrous ICA
segment on the RIGHT. Mild irregularity of the vertical petrous
segment on the LEFT.

LEFT anterior circulation normal with widely patent A1 and M1
segments of the anterior and middle cerebral arteries respectively.
Normal LEFT MCA bifurcation.

On the RIGHT, there is a tapering stenosis of the distal M1 MCA
extending into both M2 branches, which appears fairly severe and
likely flow reducing. No significant A1 ACA disease. Both anterior
cerebral arteries distally are widely patent.

Basilar artery is widely patent. Vertebrals codominant without V4
stenosis.

Mild irregularity of the proximal posterior cerebral artery
segments. There is poor flow related enhancement in the RIGHT PCA
beyond the P2 segment, suspected intracranial atherosclerotic
disease. Similar changes beginning at the P2-P3 junction on the
LEFT.

No visible cerebellar branch occlusion or saccular aneurysm.
IMPRESSION: No acute stroke.

Global atrophy with moderate chronic microvascular ischemic change,
but no large vessel infarct.

Potentially symptomatic flow reducing lesions of the petrous RIGHT
ICA, distal RIGHT M1 and proximal M2 MCA branches, as well as both
posterior cerebral arteries, RIGHT greater than LEFT. These findings
are most commonly seen in diabetic related intracranial
atherosclerotic disease.

## 2018-08-07 IMAGING — MR MR MRA HEAD W/O CM
1 series · 12 of 48 positions shown · non-contrast
Comparison: CT head 02/25/2016.

CLINICAL DATA: Possible TIA. LEFT-sided lip numbness and headache,
with normal neurologic exam. Stroke risk factors include
hypertension, diabetes mellitus, and hyperlipidemia. Initial
encounter.

EXAM:
MRI HEAD WITHOUT CONTRAST
MRA HEAD WITHOUT CONTRAST
TECHNIQUE: Multiplanar, multiecho pulse sequences of the brain and surrounding
structures were obtained without intravenous contrast. Angiographic
images of the head were obtained using MRA technique without
contrast.

[Series 3: MRA · axial · 0.7mm · 0.33mm/px · z∈[-41,+67]mm · 12 of 162 slices shown]
[im 1/162]
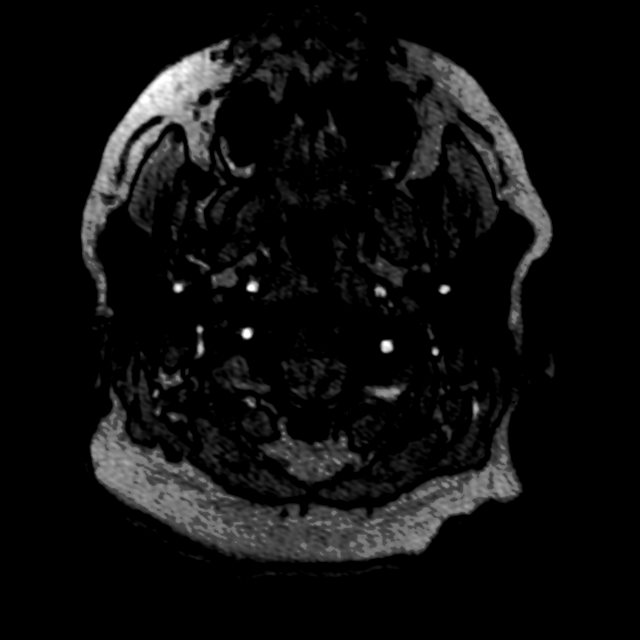
[im 11/162]
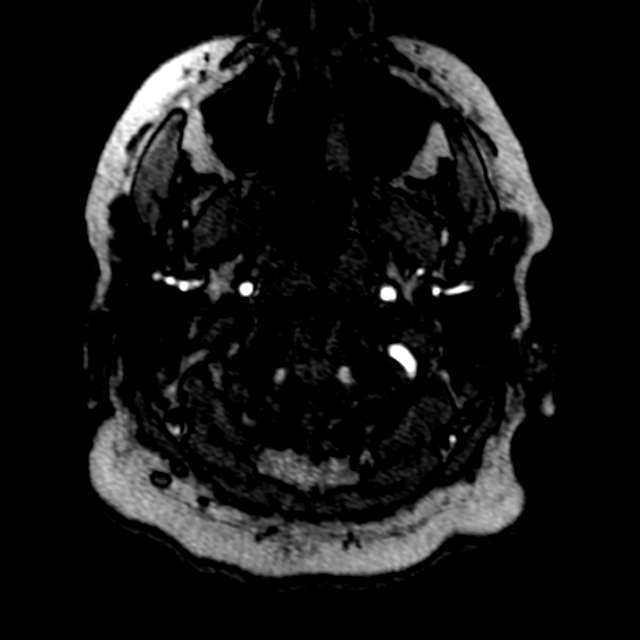
[im 28/162]
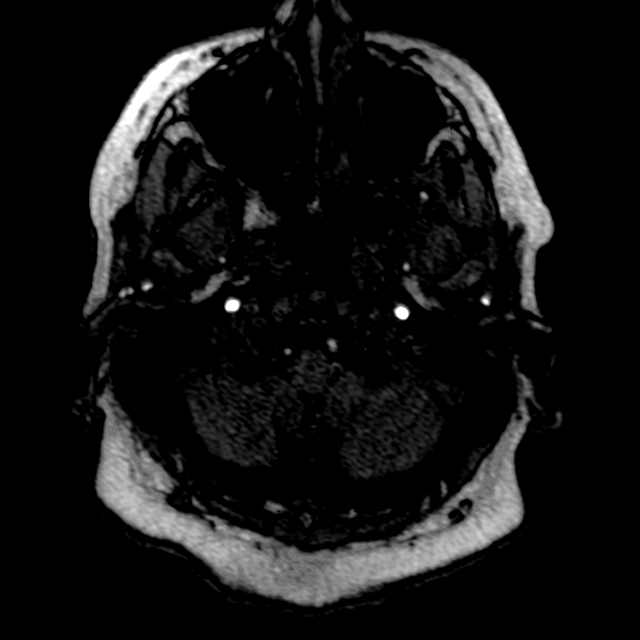
[im 31/162]
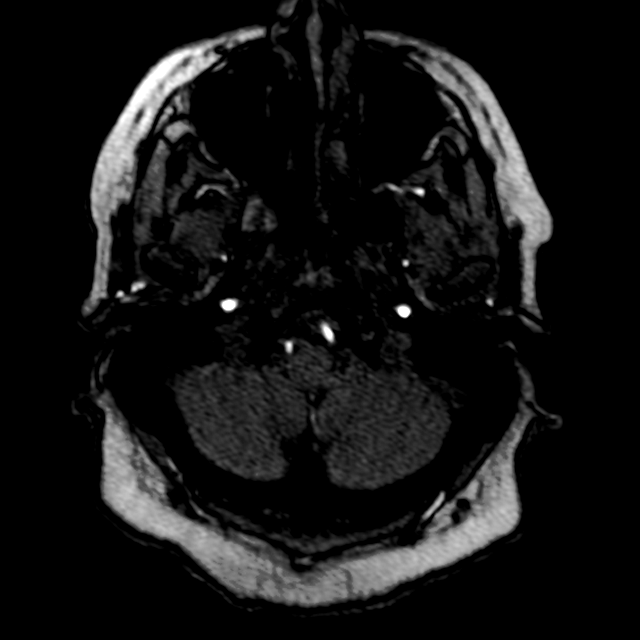
[im 52/162]
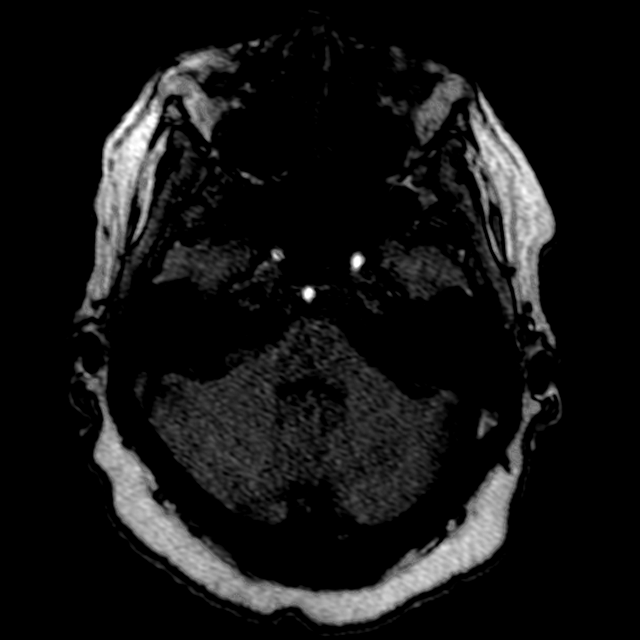
[im 72/162]
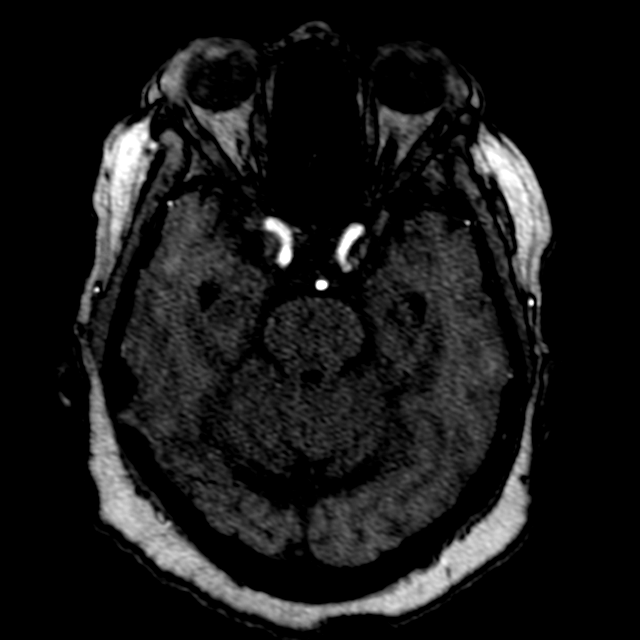
[im 83/162]
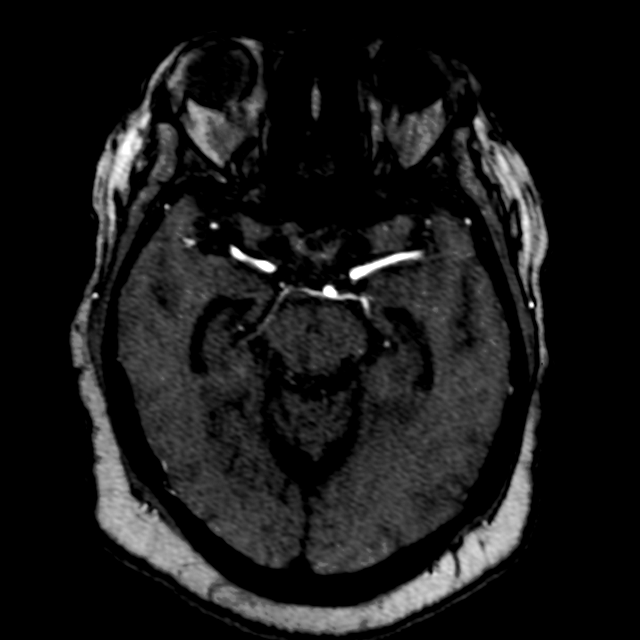
[im 93/162]
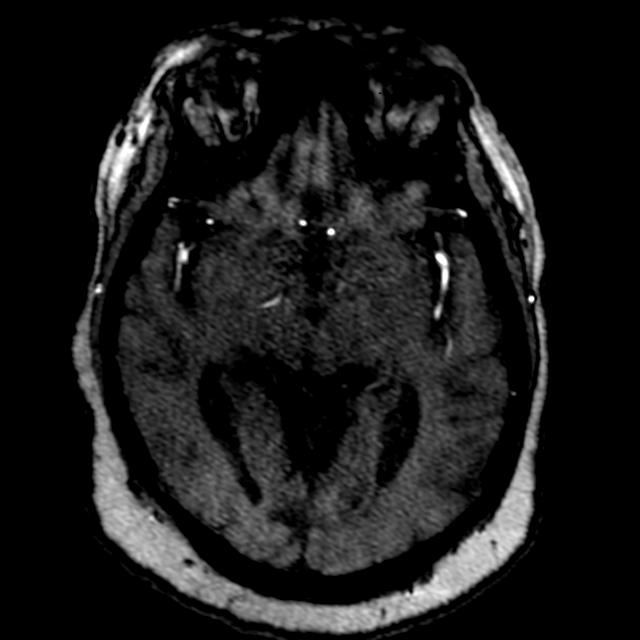
[im 114/162]
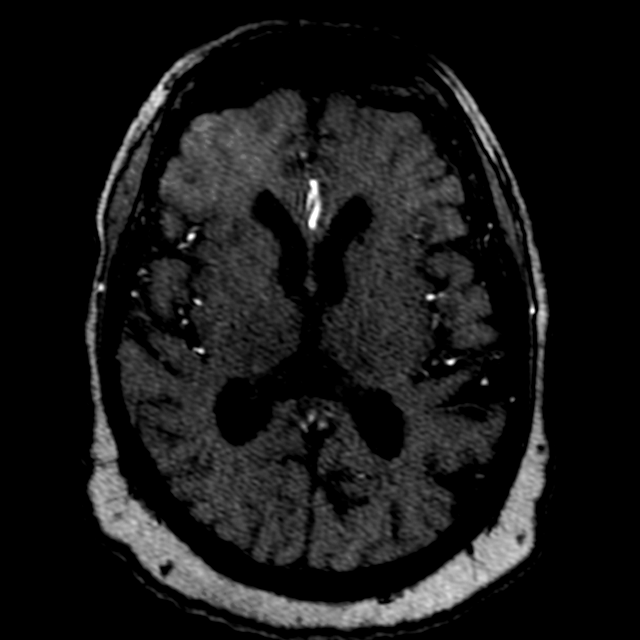
[im 134/162]
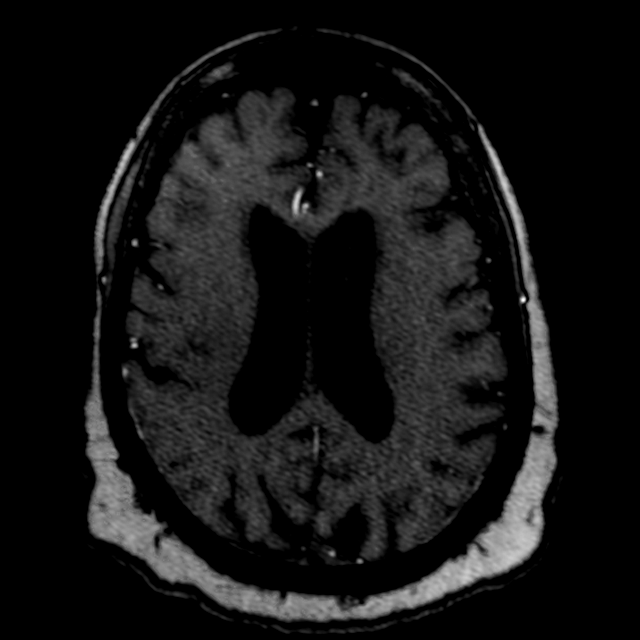
[im 138/162]
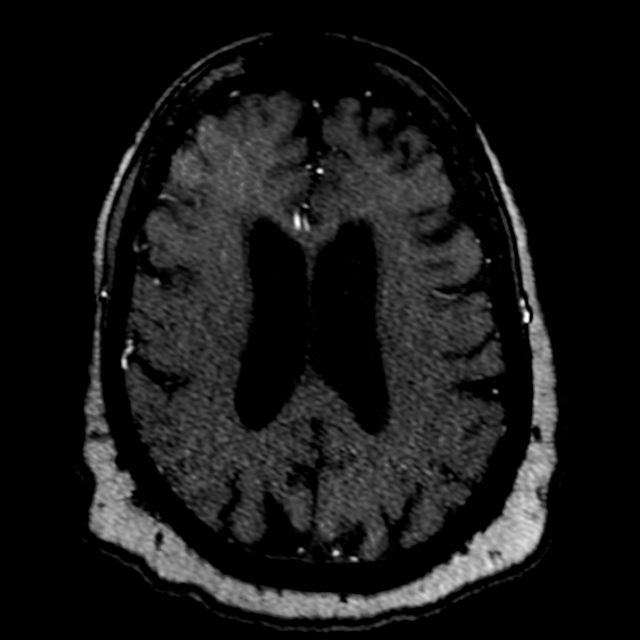
[im 155/162]
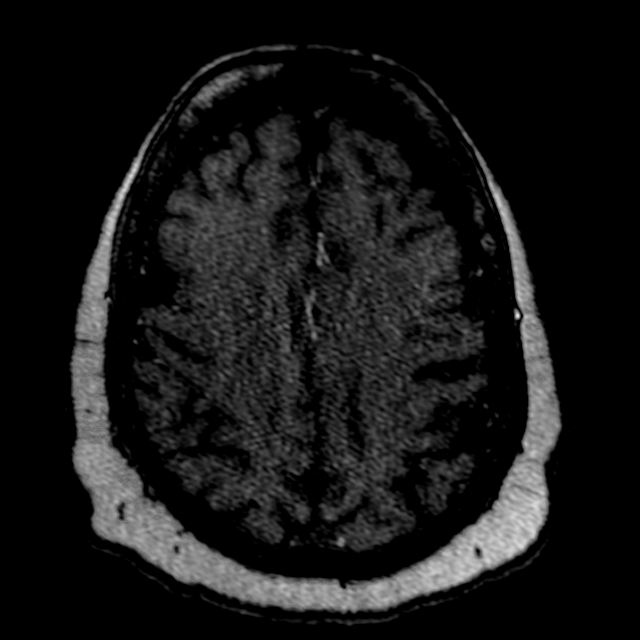

[12 of 48 positions shown; findings below may reference images not displayed]

FINDINGS: MRI HEAD FINDINGS

No evidence for acute infarction, hemorrhage, mass lesion,
hydrocephalus, or extra-axial fluid. Global atrophy. Hydrocephalus
ex vacuo. Moderate T2 and FLAIR hyperintensities throughout the
white matter, also involving the brainstem, representing chronic
microvascular ischemic change.

Flow voids are maintained throughout the carotid, basilar, and
vertebral arteries. There are no areas of chronic hemorrhage.

Unremarkable visualized calvarium, skullbase, and cervical
vertebrae. Pituitary, pineal, cerebellar tonsils unremarkable. No
upper cervical cord lesions.

Visualized calvarium, skull base, and upper cervical osseous
structures unremarkable. Scalp and extracranial soft tissues,
orbits, sinuses, and mastoids show no acute process.

MRA HEAD FINDINGS

Internal carotid arteries are dolichoectatic. Severe tandem
stenoses, estimated 75-90%, are noted of the vertical petrous ICA
segment on the RIGHT. Mild irregularity of the vertical petrous
segment on the LEFT.

LEFT anterior circulation normal with widely patent A1 and M1
segments of the anterior and middle cerebral arteries respectively.
Normal LEFT MCA bifurcation.

On the RIGHT, there is a tapering stenosis of the distal M1 MCA
extending into both M2 branches, which appears fairly severe and
likely flow reducing. No significant A1 ACA disease. Both anterior
cerebral arteries distally are widely patent.

Basilar artery is widely patent. Vertebrals codominant without V4
stenosis.

Mild irregularity of the proximal posterior cerebral artery
segments. There is poor flow related enhancement in the RIGHT PCA
beyond the P2 segment, suspected intracranial atherosclerotic
disease. Similar changes beginning at the P2-P3 junction on the
LEFT.

No visible cerebellar branch occlusion or saccular aneurysm.
IMPRESSION: No acute stroke.

Global atrophy with moderate chronic microvascular ischemic change,
but no large vessel infarct.

Potentially symptomatic flow reducing lesions of the petrous RIGHT
ICA, distal RIGHT M1 and proximal M2 MCA branches, as well as both
posterior cerebral arteries, RIGHT greater than LEFT. These findings
are most commonly seen in diabetic related intracranial
atherosclerotic disease.

## 2018-08-17 IMAGING — DX DG CHEST 2V
2 series · 2 of 2 positions shown · non-contrast
Comparison: Chest x-ray of 01/27/2016

CLINICAL DATA: Headache, elevated blood pressure, fatigue and
generalized weakness

EXAM:
CHEST  2 VIEW

[chest pa]
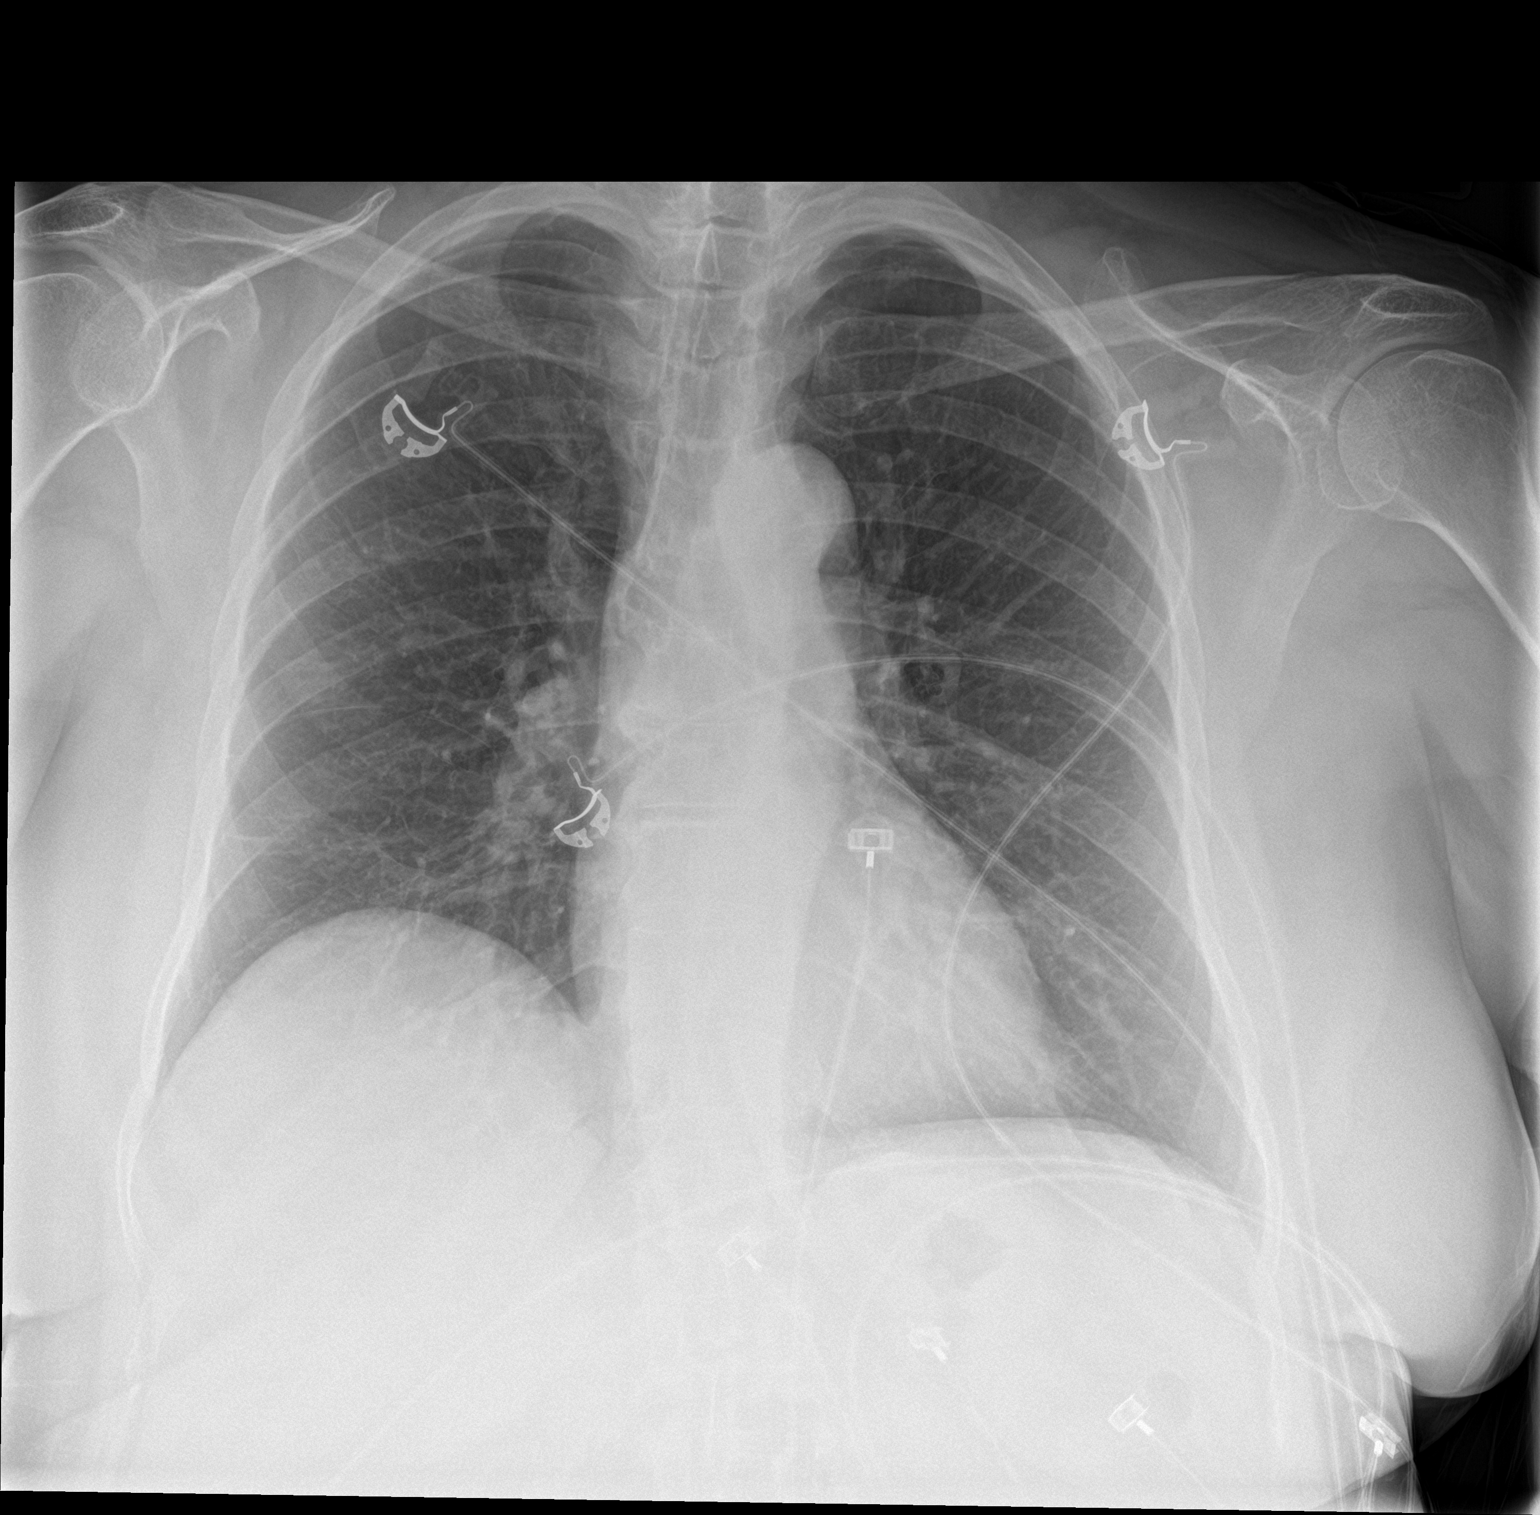

[chest lat]
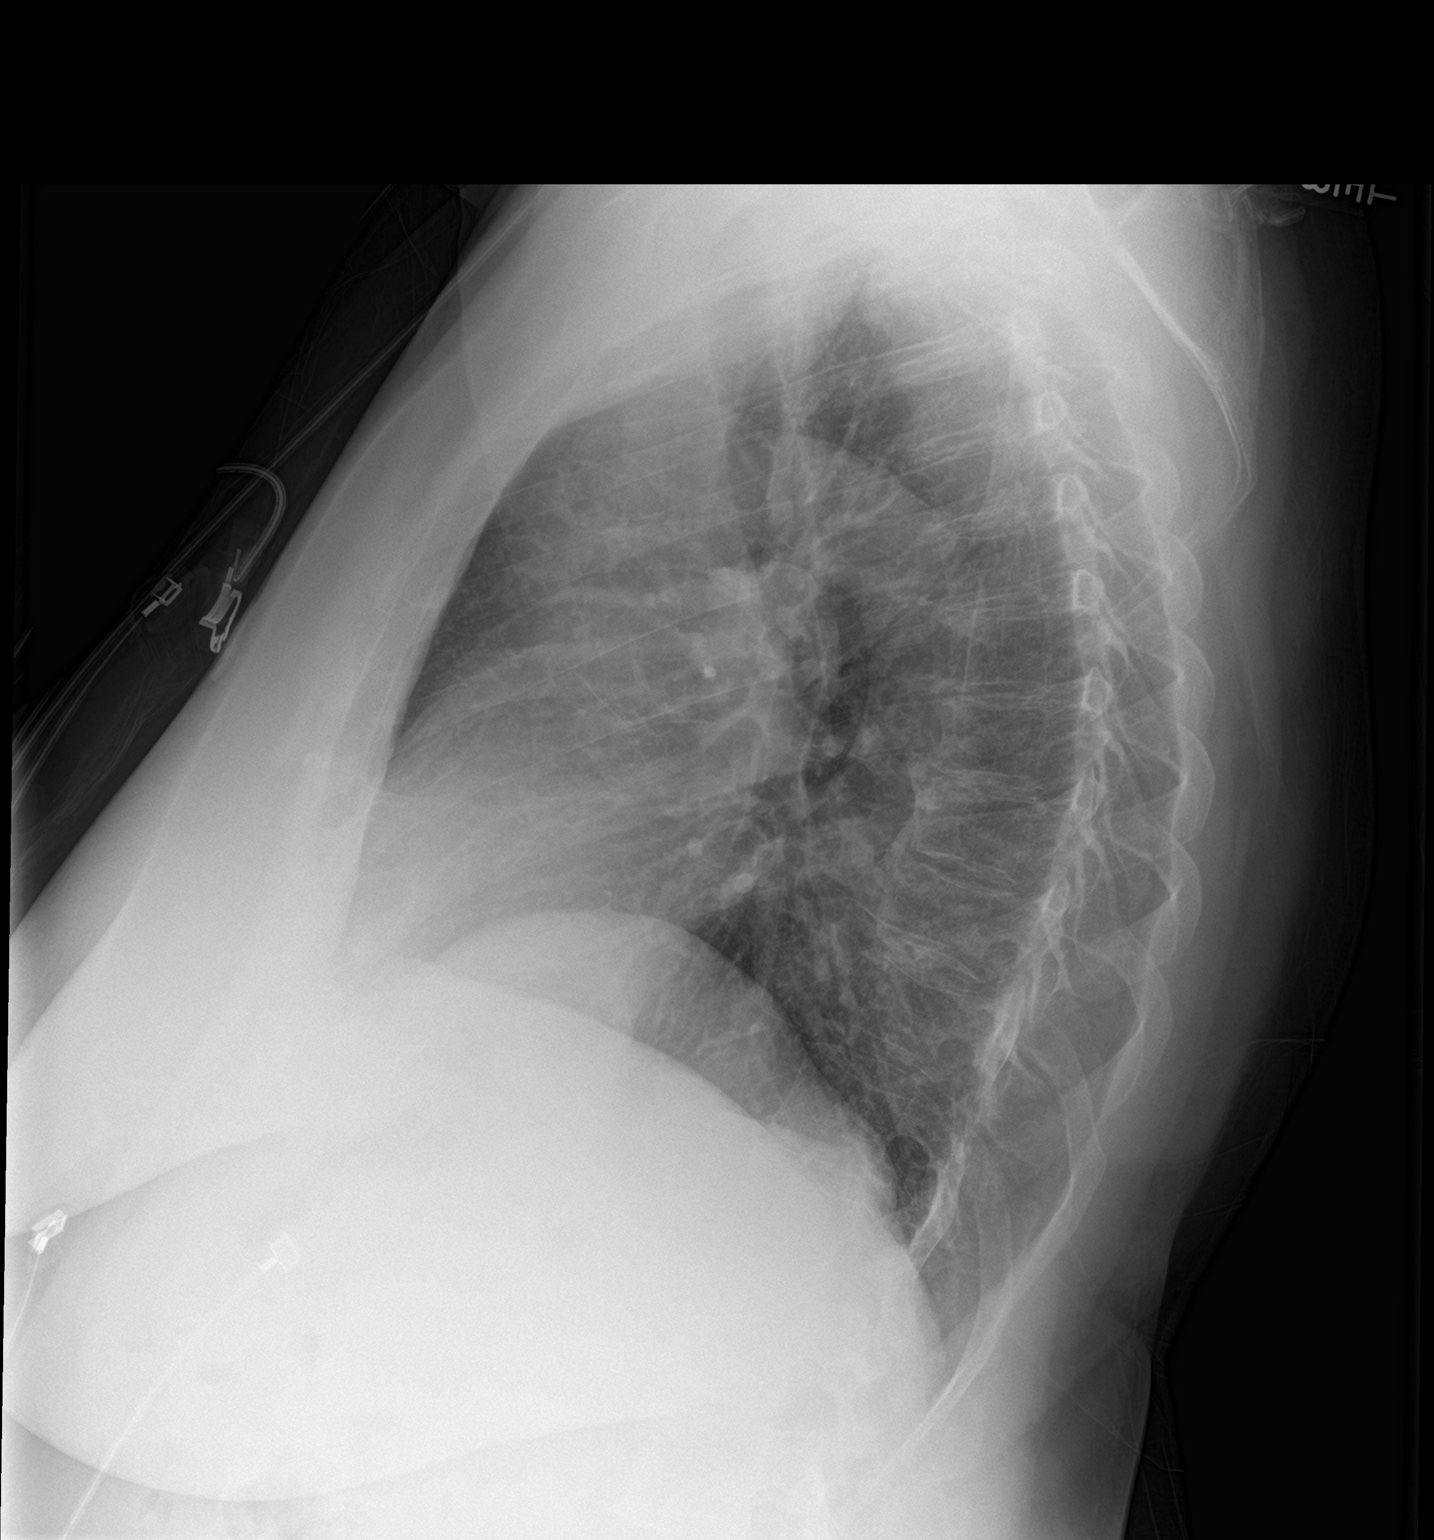

[2 of 2 positions shown; findings below may reference images not displayed]

FINDINGS: No active infiltrate or effusion is seen. Mediastinal and hilar
contours are unremarkable. The heart is within normal limits in
size. Only mild degenerative change is noted in the mid to lower
thoracic spine.
IMPRESSION: No active cardiopulmonary disease.

## 2018-08-17 IMAGING — CT CT HEAD W/O CM
3 series · 16 of 47 positions shown, 19 images · non-contrast
Comparison: Head CT dated 02/25/2016. Also head CT dated
05/10/2013.

CLINICAL DATA: Generalized weakness, increased blood pressure and
headache.

EXAM:
CT HEAD WITHOUT CONTRAST
TECHNIQUE: Contiguous axial images were obtained from the base of the skull
through the vertex without intravenous contrast.

[Series 2: head wo · axial · 0.45mm/px · z∈[+251,+396]mm · 10 of 35 slices shown, 13 images]
[im 3/35  brain]
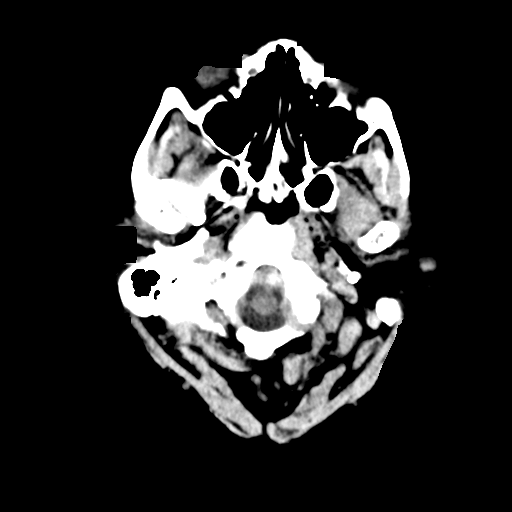
[im 3/35  bone]
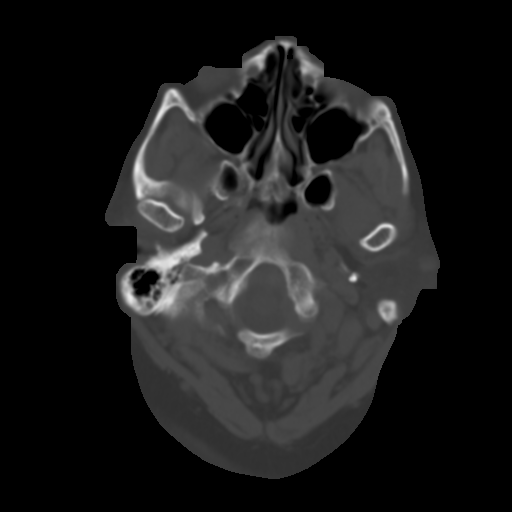
[im 6/35  brain]
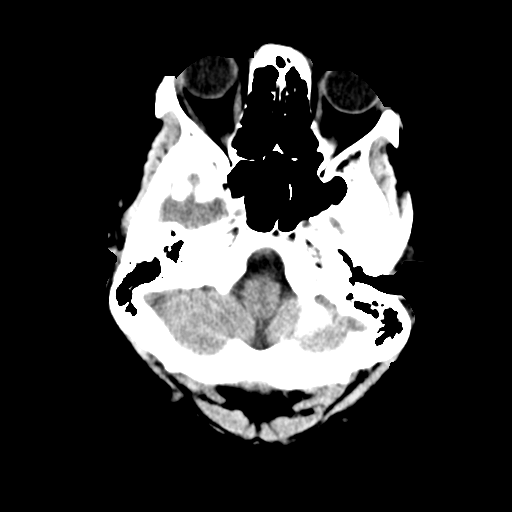
[im 10/35  brain]
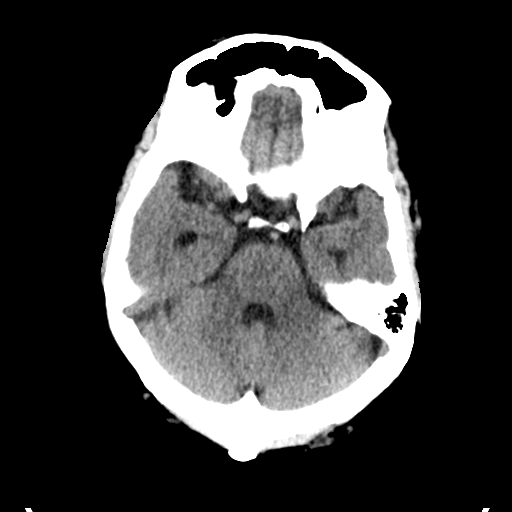
[im 12/35  brain]
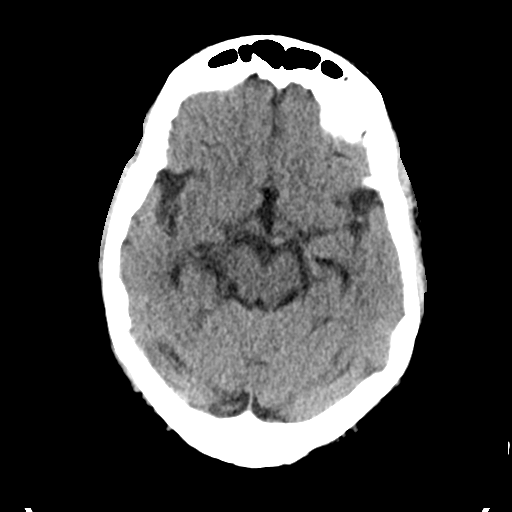
[im 16/35  brain]
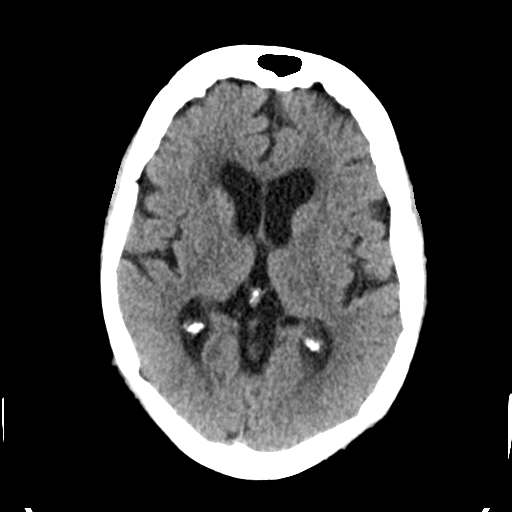
[im 16/35  bone]
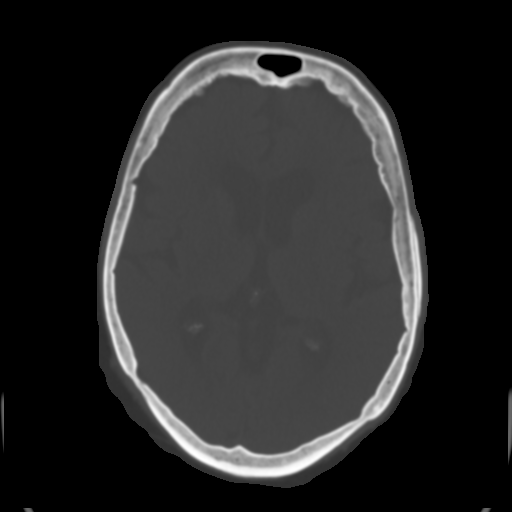
[im 19/35  brain]
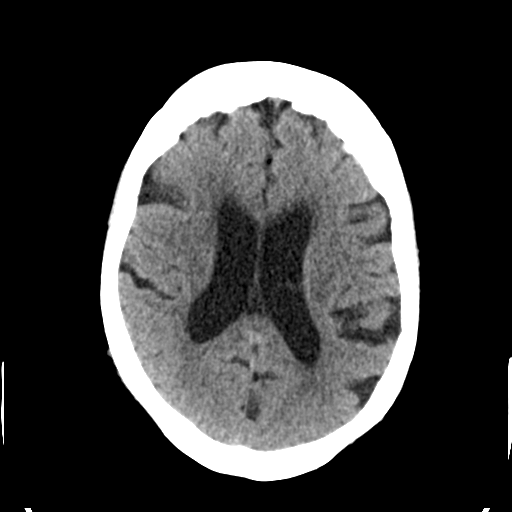
[im 23/35  brain]
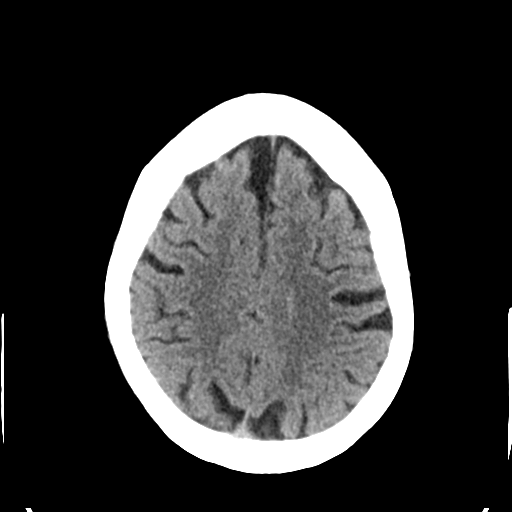
[im 26/35  brain]
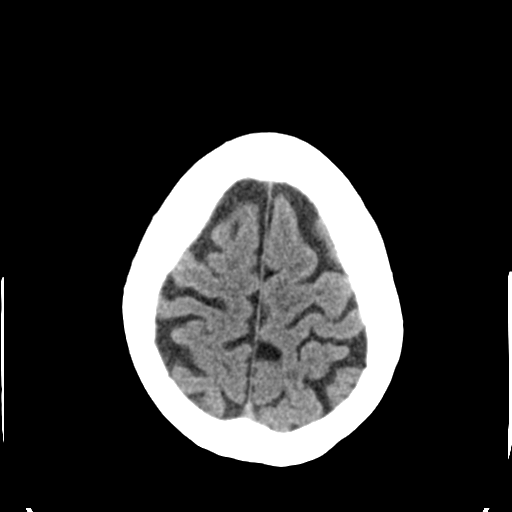
[im 29/35  brain]
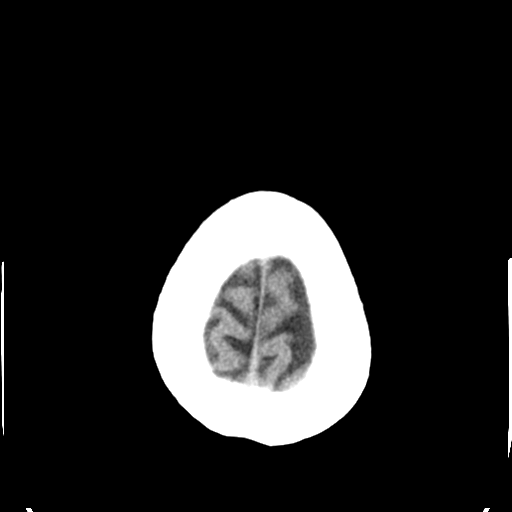
[im 29/35  bone]
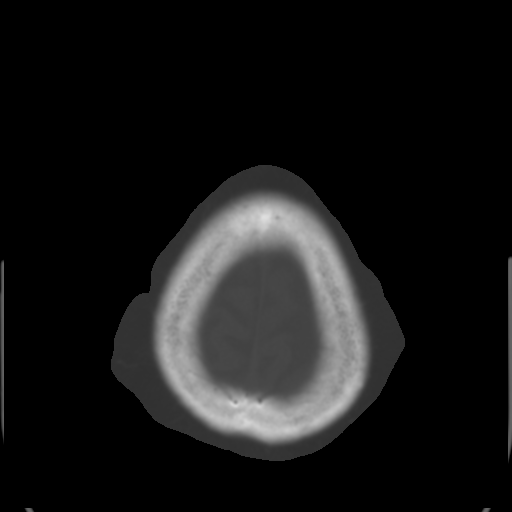
[im 32/35  brain]
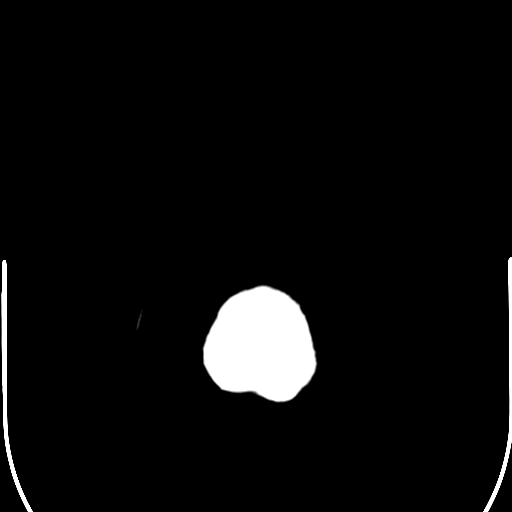

[Series 4: coronal soft tissue · coronal · 0.34mm/px · 3 of 67 slices shown]
[im 23/67  brain]
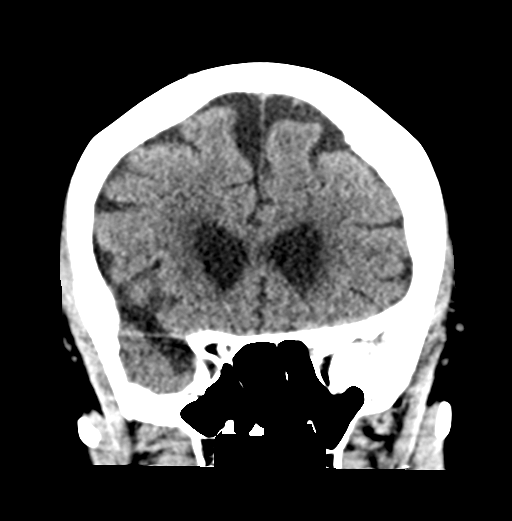
[im 30/67  brain]
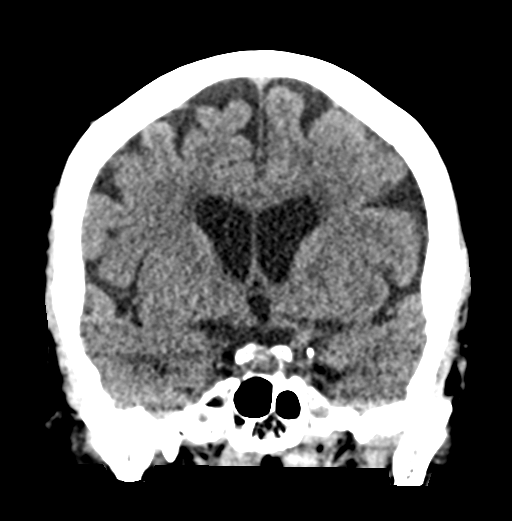
[im 37/67  brain]
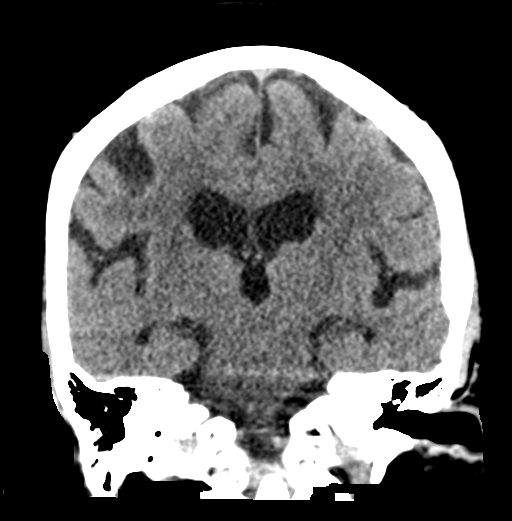

[Series 5: sagittal soft tissue · sagittal · 0.32mm/px · 3 of 57 slices shown]
[im 19/57  brain]
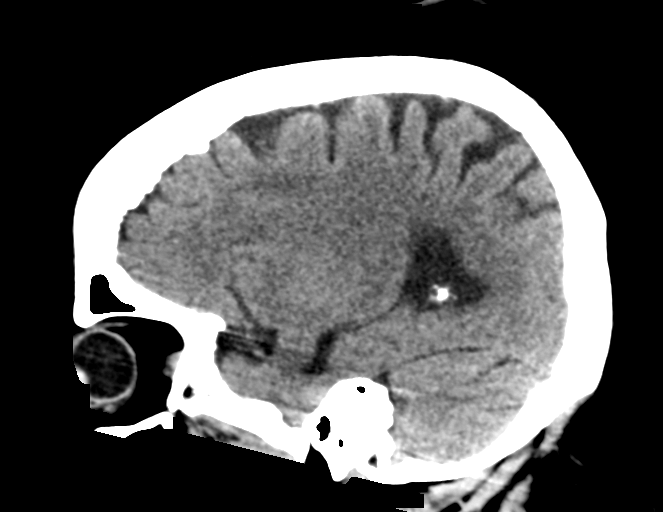
[im 29/57  brain]
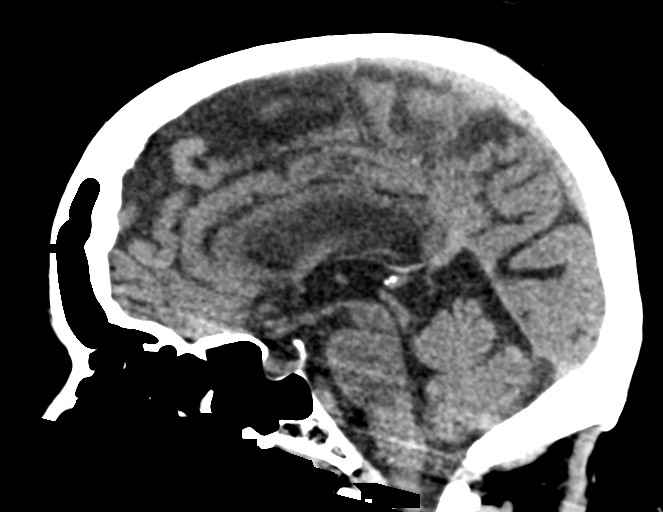
[im 38/57  brain]
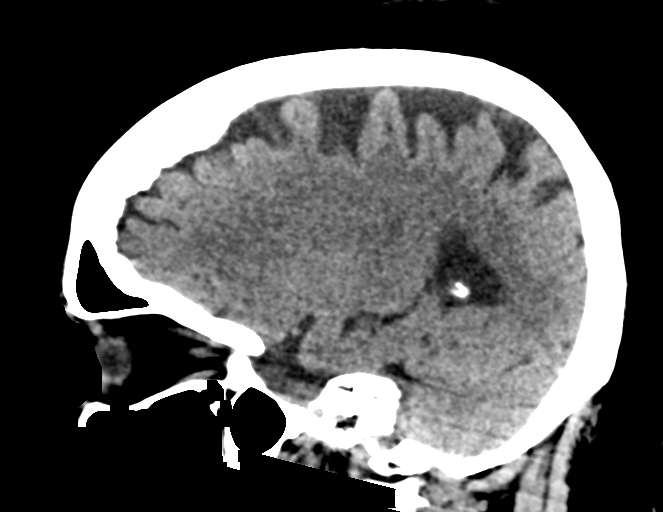

[16 of 47 positions shown; findings below may reference images not displayed]

FINDINGS: Brain: Again noted is mild generalized age related parenchymal
atrophy with commensurate dilatation of the ventricles and sulci.
Mild chronic small vessel ischemic changes again noted within the
bilateral periventricular and subcortical white matter regions.

There is no mass, hemorrhage, edema or other evidence of acute
parenchymal abnormality. No extra-axial hemorrhage.

Vascular: No hyperdense vessel or unexpected calcification.

Skull: Normal. Negative for fracture or focal lesion.

Sinuses/Orbits: No acute finding.

Other: None.
IMPRESSION: 1. No acute intracranial abnormality. No intracranial mass,
hemorrhage or edema.
2. Mild atrophy and chronic ischemic changes in the white matter.

## 2018-08-18 DIAGNOSIS — R0902 Hypoxemia: Secondary | ICD-10-CM | POA: Diagnosis not present

## 2018-08-28 ENCOUNTER — Other Ambulatory Visit: Payer: Self-pay | Admitting: Cardiology

## 2018-09-12 ENCOUNTER — Telehealth: Payer: Self-pay | Admitting: *Deleted

## 2018-09-12 NOTE — Telephone Encounter (Signed)
Multiple attempts to reach pt regarding upcoming appt with Dr Harl Bowie. Canceled appt with current pandemic and will have in office staff mail pt letter. Recall placed

## 2018-09-16 ENCOUNTER — Ambulatory Visit: Payer: Medicare Other | Admitting: Cardiology

## 2018-09-18 DIAGNOSIS — R0902 Hypoxemia: Secondary | ICD-10-CM | POA: Diagnosis not present

## 2018-09-22 ENCOUNTER — Ambulatory Visit: Payer: Medicare Other | Admitting: Family Medicine

## 2018-10-06 ENCOUNTER — Other Ambulatory Visit: Payer: Self-pay

## 2018-10-06 ENCOUNTER — Encounter: Payer: Self-pay | Admitting: Family Medicine

## 2018-10-06 ENCOUNTER — Ambulatory Visit (INDEPENDENT_AMBULATORY_CARE_PROVIDER_SITE_OTHER): Payer: Medicare Other | Admitting: Family Medicine

## 2018-10-06 VITALS — BP 123/72 | Ht 66.0 in | Wt 204.0 lb

## 2018-10-06 DIAGNOSIS — E669 Obesity, unspecified: Secondary | ICD-10-CM

## 2018-10-06 DIAGNOSIS — Z1231 Encounter for screening mammogram for malignant neoplasm of breast: Secondary | ICD-10-CM | POA: Diagnosis not present

## 2018-10-06 DIAGNOSIS — E785 Hyperlipidemia, unspecified: Secondary | ICD-10-CM | POA: Diagnosis not present

## 2018-10-06 DIAGNOSIS — F329 Major depressive disorder, single episode, unspecified: Secondary | ICD-10-CM

## 2018-10-06 DIAGNOSIS — G4733 Obstructive sleep apnea (adult) (pediatric): Secondary | ICD-10-CM

## 2018-10-06 DIAGNOSIS — F419 Anxiety disorder, unspecified: Secondary | ICD-10-CM

## 2018-10-06 DIAGNOSIS — N183 Chronic kidney disease, stage 3 unspecified: Secondary | ICD-10-CM

## 2018-10-06 DIAGNOSIS — I10 Essential (primary) hypertension: Secondary | ICD-10-CM | POA: Diagnosis not present

## 2018-10-06 DIAGNOSIS — E1121 Type 2 diabetes mellitus with diabetic nephropathy: Secondary | ICD-10-CM | POA: Diagnosis not present

## 2018-10-06 DIAGNOSIS — F32A Depression, unspecified: Secondary | ICD-10-CM

## 2018-10-06 NOTE — Patient Instructions (Addendum)
Annual Physical exam with MD , in mid July, call if you need me before  Fasting lipid, cmp and eGFr and hBA1c in June at the hospital on the same day that you get labs for Oncology  You are being referred to Dr Luan Pulling , per your request, re sleep apnea  Mammogram will be scheduled and appt mailed,per your request, this is  due in [place]  It is important that you exercise regularly at least 30 minutes 5 times a week. If you develop chest pain, have severe difficulty breathing, or feel very tired, stop exercising immediately and seek medical attention   Think about what you will eat, plan ahead. Choose " clean, green, fresh or frozen" over canned, processed or packaged foods which are more sugary, salty and fatty. 70 to 75% of food eaten should be vegetables and fruit. Three meals at set times with snacks allowed between meals, but they must be fruit or vegetables. Aim to eat over a 12 hour period , example 7 am to 7 pm, and STOP after  your last meal of the day. Drink water,generally about 64 ounces per day, no other drink is as healthy. Fruit juice is best enjoyed in a healthy way, by EATING the fruit.   Thanks for choosing [facility], we consider it a privelige to serve you.

## 2018-10-06 NOTE — Progress Notes (Signed)
Virtual Visit via Telephone Note  I connected with Cheryl Abbott on 10/06/18 at  3:00 PM EDT by telephone and verified that I am speaking with the correct person using two identifiers.   I discussed the limitations, risks, security and privacy concerns of performing an evaluation and management service by telephone and the availability of in person appointments. I also discussed with the patient that there may be a patient responsible charge related to this service. The patient expressed understanding and agreed to proceed. Telephone contact made with patient is in her home and I am at the office, face to contact not impossible   History of Present Illness: C/o untreated  Sleep apnea, wants to see a new Provider Denies recent fever or chills. Denies sinus pressure, nasal congestion, ear pain or sore throat. Denies chest congestion, productive cough or wheezing. Denies chest pains, palpitations and leg swelling Denies abdominal pain, nausea, vomiting,diarrhea or constipation.   Denies dysuria, frequency, hesitancy or incontinence. Denies joint pain, swelling and limitation in mobility. Denies headaches, seizures, numbness, or tingling. C/o depression, anxiety and  Insomnia.Not as well controlled with new COVID 19 situation, not suicidal or homicidal, c/o lack of motivation and reduced energy Denies skin break down or rash.       Observations/Objective: BP 123/72   Ht 5\' 6"  (1.676 m)   Wt 204 lb (92.5 kg)   SpO2 94%   BMI 32.93 kg/m  Good communication with no confusion and intact memory. Alert and oriented x 3 No signs of respiratory distress during sppech    Assessment and Plan: Hypertension goal BP (blood pressure) < 130/80 Controlled, no change in medication DASH diet and commitment to daily physical activity for a minimum of 30 minutes discussed and encouraged, as a part of hypertension management. The importance of attaining a healthy weight is also  discussed.  BP/Weight 10/06/2018 07/29/2018 07/16/2018 07/16/2018 06/25/2018 06/02/2018 78/10/8848  Systolic BP 277 412 878 676 720 947 096  Diastolic BP 72 72 283 66 85 76 80  Wt. (Lbs) 204 204.5 - 210 209.4 209 208  BMI 32.93 33.01 - 33.89 33.8 33.23 33.57       Obesity (BMI 30.0-34.9)  Patient re-educated about  the importance of commitment to a  minimum of 150 minutes of exercise per week as able.  The importance of healthy food choices with portion control discussed, as well as eating regularly and within a 12 hour window most days. The need to choose "clean , green" food 50 to 75% of the time is discussed, as well as to make water the primary drink and set a goal of 64 ounces water daily.    Weight /BMI 10/06/2018 07/29/2018 07/16/2018  WEIGHT 204 lb 204 lb 8 oz 210 lb  HEIGHT 5\' 6"  - 5\' 6"   BMI 32.93 kg/m2 33.01 kg/m2 33.89 kg/m2      Type 2 diabetes with nephropathy ([place]) Cheryl Abbott is reminded of the importance of commitment to daily physical activity for 30 minutes or more, as able and the need to limit carbohydrate intake to 30 to 60 grams per meal to help with blood sugar control.   The need to take medication as prescribed, test blood sugar as directed, and to call between visits if there is a concern that blood sugar is uncontrolled is also discussed.   Cheryl Abbott is reminded of the importance of daily foot exam, annual eye examination, and good blood sugar, blood pressure and cholesterol control.  Diabetic Labs Latest  Ref Rng & Units 07/16/2018 07/16/2018 05/21/2018 05/06/2018 03/11/2018  HbA1c <5.7 % of total Hgb - - 6.9(H) - -  Microalbumin mg/dL - - - - -  Micro/Creat Ratio 0.0 - 30.0 mg/g - - - - -  Chol <200 mg/dL - - 172 - -  HDL >50 mg/dL - - 47(L) - -  Calc LDL mg/dL (calc) - - 103(H) - -  Triglycerides <150 mg/dL - - 120 - -  Creatinine 0.44 - 1.00 mg/dL 1.68(H) 1.75(H) 1.72(H) 1.73(H) 1.35(H)   BP/Weight 10/06/2018 07/29/2018 07/16/2018 07/16/2018 06/25/2018 06/02/2018  76/12/3417  Systolic BP 379 024 097 353 299 242 683  Diastolic BP 72 72 419 66 85 76 80  Wt. (Lbs) 204 204.5 - 210 209.4 209 208  BMI 32.93 33.01 - 33.89 33.8 33.23 33.57   Foot/eye exam completion dates Latest Ref Rng & Units 05/19/2018 05/15/2018  Eye Exam No Retinopathy - No Retinopathy  Foot exam Order - - -  Foot Form Completion - Done -  Controlled, no change in medication Updated lab needed at/ before next visit.       Anxiety and depression Increased symptoms due to pandemic, no interest in therapy, not suicidal or homicidal. Continue current medication  Hyperlipidemia LDL goal <100 Hyperlipidemia:Low fat diet discussed and encouraged.   Lipid Panel  Lab Results  Component Value Date   CHOL 172 05/21/2018   HDL 47 (L) 05/21/2018   LDLCALC 103 (H) 05/21/2018   TRIG 120 05/21/2018   CHOLHDL 3.7 05/21/2018   Not at goal Updated lab needed at/ before next visit.     CKD (chronic kidney disease) stage 3, GFR 30-59 ml/min Unchanged, needs to avoid NSAIDS and keep BP and diabetes well controlled  Sleep apnea Currently untreated, does not have and cannot afford supplies form Prescriber she has been seeing in th past [facility]) wants referral to alternate Provider, will refer to Dr. Luan Pulling    Follow Up Instructions:    I discussed the assessment and treatment plan with the patient. The patient was provided an opportunity to ask questions and all were answered. The patient agreed with the plan and demonstrated an understanding of the instructions.   The patient was advised to call back or seek an in-person evaluation if the symptoms worsen or if the condition fails to improve as anticipated.  I provided 22 minutes of non-face-to-face time during this encounter.   Tula Nakayama, MD

## 2018-10-11 MED ORDER — ALPRAZOLAM 0.5 MG PO TABS: ORAL_TABLET | ORAL | 5 refills | Status: DC

## 2018-10-11 NOTE — Assessment & Plan Note (Signed)
Unchanged, needs to avoid NSAIDS and keep BP and diabetes well controlled

## 2018-10-11 NOTE — Assessment & Plan Note (Signed)
Currently untreated, does not have and cannot afford supplies form Prescriber she has been seeing in th past [facility]) wants referral to alternate Provider, will refer to Dr. Luan Pulling

## 2018-10-11 NOTE — Assessment & Plan Note (Signed)
Hyperlipidemia:Low fat diet discussed and encouraged.   Lipid Panel  Lab Results  Component Value Date   CHOL 172 05/21/2018   HDL 47 (L) 05/21/2018   LDLCALC 103 (H) 05/21/2018   TRIG 120 05/21/2018   CHOLHDL 3.7 05/21/2018   Not at goal Updated lab needed at/ before next visit.

## 2018-10-11 NOTE — Assessment & Plan Note (Signed)
Cheryl Abbott is reminded of the importance of commitment to daily physical activity for 30 minutes or more, as able and the need to limit carbohydrate intake to 30 to 60 grams per meal to help with blood sugar control.   The need to take medication as prescribed, test blood sugar as directed, and to call between visits if there is a concern that blood sugar is uncontrolled is also discussed.   Cheryl Abbott is reminded of the importance of daily foot exam, annual eye examination, and good blood sugar, blood pressure and cholesterol control.  Diabetic Labs Latest Ref Rng & Units 07/16/2018 07/16/2018 05/21/2018 05/06/2018 03/11/2018  HbA1c <5.7 % of total Hgb - - 6.9(H) - -  Microalbumin mg/dL - - - - -  Micro/Creat Ratio 0.0 - 30.0 mg/g - - - - -  Chol <200 mg/dL - - 172 - -  HDL >50 mg/dL - - 47(L) - -  Calc LDL mg/dL (calc) - - 103(H) - -  Triglycerides <150 mg/dL - - 120 - -  Creatinine 0.44 - 1.00 mg/dL 1.68(H) 1.75(H) 1.72(H) 1.73(H) 1.35(H)   BP/Weight 10/06/2018 07/29/2018 07/16/2018 07/16/2018 06/25/2018 06/02/2018 84/06/3242  Systolic BP 010 272 536 644 034 742 595  Diastolic BP 72 72 638 66 85 76 80  Wt. (Lbs) 204 204.5 - 210 209.4 209 208  BMI 32.93 33.01 - 33.89 33.8 33.23 33.57   Foot/eye exam completion dates Latest Ref Rng & Units 05/19/2018 05/15/2018  Eye Exam No Retinopathy - No Retinopathy  Foot exam Order - - -  Foot Form Completion - Done -  Controlled, no change in medication Updated lab needed at/ before next visit.

## 2018-10-11 NOTE — Assessment & Plan Note (Signed)
Increased symptoms due to pandemic, no interest in therapy, not suicidal or homicidal. Continue current medication

## 2018-10-11 NOTE — Assessment & Plan Note (Signed)
Controlled, no change in medication DASH diet and commitment to daily physical activity for a minimum of 30 minutes discussed and encouraged, as a part of hypertension management. The importance of attaining a healthy weight is also discussed.  BP/Weight 10/06/2018 07/29/2018 07/16/2018 07/16/2018 06/25/2018 06/02/2018 83/0/7354  Systolic BP 301 484 039 795 369 223 009  Diastolic BP 72 72 794 66 85 76 80  Wt. (Lbs) 204 204.5 - 210 209.4 209 208  BMI 32.93 33.01 - 33.89 33.8 33.23 33.57

## 2018-10-11 NOTE — Assessment & Plan Note (Signed)
  Patient re-educated about  the importance of commitment to a  minimum of 150 minutes of exercise per week as able.  The importance of healthy food choices with portion control discussed, as well as eating regularly and within a 12 hour window most days. The need to choose "clean , green" food 50 to 75% of the time is discussed, as well as to make water the primary drink and set a goal of 64 ounces water daily.    Weight /BMI 10/06/2018 07/29/2018 07/16/2018  WEIGHT 204 lb 204 lb 8 oz 210 lb  HEIGHT 5\' 6"  - 5\' 6"   BMI 32.93 kg/m2 33.01 kg/m2 33.89 kg/m2

## 2018-10-17 ENCOUNTER — Other Ambulatory Visit: Payer: Self-pay | Admitting: Family Medicine

## 2018-10-18 DIAGNOSIS — R0902 Hypoxemia: Secondary | ICD-10-CM | POA: Diagnosis not present

## 2018-11-01 ENCOUNTER — Other Ambulatory Visit: Payer: Self-pay | Admitting: Family Medicine

## 2018-11-18 DIAGNOSIS — R0902 Hypoxemia: Secondary | ICD-10-CM | POA: Diagnosis not present

## 2018-11-19 ENCOUNTER — Encounter (HOSPITAL_COMMUNITY)
Admission: RE | Admit: 2018-11-19 | Discharge: 2018-11-19 | Disposition: A | Discharge status: Home / Self Care | Payer: Medicare Other | Source: Ambulatory Visit | Attending: Nurse Practitioner | Admitting: Nurse Practitioner

## 2018-11-19 ENCOUNTER — Other Ambulatory Visit (HOSPITAL_COMMUNITY)
Admission: RE | Admit: 2018-11-19 | Discharge: 2018-11-19 | Disposition: A | Discharge status: Home / Self Care | Payer: Medicare Other | Source: Ambulatory Visit | Attending: Family Medicine | Admitting: Family Medicine

## 2018-11-19 ENCOUNTER — Other Ambulatory Visit (HOSPITAL_COMMUNITY): Payer: Self-pay | Admitting: *Deleted

## 2018-11-19 DIAGNOSIS — E1121 Type 2 diabetes mellitus with diabetic nephropathy: Secondary | ICD-10-CM | POA: Diagnosis not present

## 2018-11-19 DIAGNOSIS — D472 Monoclonal gammopathy: Secondary | ICD-10-CM

## 2018-11-19 DIAGNOSIS — D890 Polyclonal hypergammaglobulinemia: Secondary | ICD-10-CM | POA: Diagnosis not present

## 2018-11-19 LAB — CBC WITH DIFFERENTIAL/PLATELET
Abs Immature Granulocytes: 0.02 10*3/uL (ref 0.00–0.07)
Basophils Absolute: 0 10*3/uL (ref 0.0–0.1)
Basophils Relative: 1 %
Eosinophils Absolute: 0.2 10*3/uL (ref 0.0–0.5)
Eosinophils Relative: 4 %
HCT: 43.6 % (ref 36.0–46.0)
Hemoglobin: 13.8 g/dL (ref 12.0–15.0)
Immature Granulocytes: 0 %
Lymphocytes Relative: 22 %
Lymphs Abs: 1.2 10*3/uL (ref 0.7–4.0)
MCH: 31.2 pg (ref 26.0–34.0)
MCHC: 31.7 g/dL (ref 30.0–36.0)
MCV: 98.4 fL (ref 80.0–100.0)
Monocytes Absolute: 0.5 10*3/uL (ref 0.1–1.0)
Monocytes Relative: 10 %
Neutro Abs: 3.4 10*3/uL (ref 1.7–7.7)
Neutrophils Relative %: 63 %
Platelets: 274 10*3/uL (ref 150–400)
RBC: 4.43 MIL/uL (ref 3.87–5.11)
RDW: 15.1 % (ref 11.5–15.5)
WBC: 5.3 10*3/uL (ref 4.0–10.5)
nRBC: 0 % (ref 0.0–0.2)

## 2018-11-19 LAB — LIPID PANEL
Cholesterol: 162 mg/dL (ref 0–200)
HDL: 47 mg/dL (ref >40)
LDL Cholesterol: 91 mg/dL (ref 0–99)
Total CHOL/HDL Ratio: 3.4 RATIO
Triglycerides: 121 mg/dL (ref <150)
VLDL: 24 mg/dL (ref 0–40)

## 2018-11-19 LAB — COMPREHENSIVE METABOLIC PANEL
ALT: 14 U/L (ref 0–44)
AST: 19 U/L (ref 15–41)
Albumin: 4.1 g/dL (ref 3.5–5.0)
Alkaline Phosphatase: 62 U/L (ref 38–126)
Anion gap: 8 (ref 5–15)
BUN: 14 mg/dL (ref 8–23)
CO2: 25 mmol/L (ref 22–32)
Calcium: 9.9 mg/dL (ref 8.9–10.3)
Chloride: 104 mmol/L (ref 98–111)
Creatinine, Ser: 1.55 mg/dL — ABNORMAL HIGH (ref 0.44–1.00)
GFR calc Af Amer: 38 mL/min — ABNORMAL LOW (ref >60)
GFR calc non Af Amer: 33 mL/min — ABNORMAL LOW (ref >60)
Glucose, Bld: 113 mg/dL — ABNORMAL HIGH (ref 70–99)
Potassium: 4.8 mmol/L (ref 3.5–5.1)
Sodium: 137 mmol/L (ref 135–145)
Total Bilirubin: 0.2 mg/dL — ABNORMAL LOW (ref 0.3–1.2)
Total Protein: 8.9 g/dL — ABNORMAL HIGH (ref 6.5–8.1)

## 2018-11-19 LAB — HEMOGLOBIN A1C
Hgb A1c MFr Bld: 7 % — ABNORMAL HIGH (ref 4.8–5.6)
Mean Plasma Glucose: 154.2 mg/dL

## 2018-11-19 LAB — LACTATE DEHYDROGENASE: LDH: 99 U/L (ref 98–192)

## 2018-11-20 LAB — KAPPA/LAMBDA LIGHT CHAINS
Kappa free light chain: 49.1 mg/L — ABNORMAL HIGH (ref 3.3–19.4)
Kappa, lambda light chain ratio: 3.25 — ABNORMAL HIGH (ref 0.26–1.65)
Lambda free light chains: 15.1 mg/L (ref 5.7–26.3)

## 2018-11-21 ENCOUNTER — Other Ambulatory Visit: Payer: Self-pay

## 2018-11-21 ENCOUNTER — Other Ambulatory Visit (HOSPITAL_COMMUNITY): Payer: Medicare Other

## 2018-11-21 ENCOUNTER — Ambulatory Visit (HOSPITAL_COMMUNITY)
Admission: RE | Admit: 2018-11-21 | Discharge: 2018-11-21 | Disposition: A | Discharge status: Home / Self Care | Payer: Medicare Other | Source: Ambulatory Visit | Attending: Nurse Practitioner | Admitting: Nurse Practitioner

## 2018-11-21 DIAGNOSIS — D890 Polyclonal hypergammaglobulinemia: Secondary | ICD-10-CM | POA: Diagnosis not present

## 2018-11-21 DIAGNOSIS — C9000 Multiple myeloma not having achieved remission: Secondary | ICD-10-CM | POA: Diagnosis not present

## 2018-11-21 DIAGNOSIS — D472 Monoclonal gammopathy: Secondary | ICD-10-CM | POA: Diagnosis not present

## 2018-11-21 LAB — PROTEIN ELECTROPHORESIS, SERUM
A/G Ratio: 0.8 (ref 0.7–1.7)
Albumin ELP: 3.7 g/dL (ref 2.9–4.4)
Alpha-1-Globulin: 0.2 g/dL (ref 0.0–0.4)
Alpha-2-Globulin: 0.9 g/dL (ref 0.4–1.0)
Beta Globulin: 1.4 g/dL — ABNORMAL HIGH (ref 0.7–1.3)
Gamma Globulin: 1.9 g/dL — ABNORMAL HIGH (ref 0.4–1.8)
Globulin, Total: 4.4 g/dL — ABNORMAL HIGH (ref 2.2–3.9)
M-Spike, %: 1.4 g/dL — ABNORMAL HIGH
Total Protein ELP: 8.1 g/dL (ref 6.0–8.5)

## 2018-11-26 ENCOUNTER — Other Ambulatory Visit: Payer: Self-pay | Admitting: Cardiology

## 2018-11-26 ENCOUNTER — Other Ambulatory Visit: Payer: Self-pay | Admitting: Family Medicine

## 2018-11-28 ENCOUNTER — Inpatient Hospital Stay (HOSPITAL_COMMUNITY): Payer: Medicare Other | Attending: Hematology | Admitting: Nurse Practitioner

## 2018-11-28 ENCOUNTER — Other Ambulatory Visit: Payer: Self-pay

## 2018-11-28 DIAGNOSIS — C9000 Multiple myeloma not having achieved remission: Secondary | ICD-10-CM | POA: Diagnosis not present

## 2018-11-28 DIAGNOSIS — Z7982 Long term (current) use of aspirin: Secondary | ICD-10-CM | POA: Insufficient documentation

## 2018-11-28 DIAGNOSIS — R51 Headache: Secondary | ICD-10-CM | POA: Insufficient documentation

## 2018-11-28 DIAGNOSIS — G4700 Insomnia, unspecified: Secondary | ICD-10-CM | POA: Diagnosis not present

## 2018-11-28 DIAGNOSIS — D472 Monoclonal gammopathy: Secondary | ICD-10-CM

## 2018-11-28 DIAGNOSIS — K219 Gastro-esophageal reflux disease without esophagitis: Secondary | ICD-10-CM | POA: Insufficient documentation

## 2018-11-28 DIAGNOSIS — F329 Major depressive disorder, single episode, unspecified: Secondary | ICD-10-CM | POA: Diagnosis not present

## 2018-11-28 DIAGNOSIS — R5383 Other fatigue: Secondary | ICD-10-CM | POA: Insufficient documentation

## 2018-11-28 DIAGNOSIS — Z79899 Other long term (current) drug therapy: Secondary | ICD-10-CM | POA: Insufficient documentation

## 2018-11-28 DIAGNOSIS — I10 Essential (primary) hypertension: Secondary | ICD-10-CM | POA: Diagnosis not present

## 2018-11-28 DIAGNOSIS — Z87891 Personal history of nicotine dependence: Secondary | ICD-10-CM | POA: Insufficient documentation

## 2018-11-28 DIAGNOSIS — R0602 Shortness of breath: Secondary | ICD-10-CM | POA: Insufficient documentation

## 2018-11-28 DIAGNOSIS — E785 Hyperlipidemia, unspecified: Secondary | ICD-10-CM | POA: Diagnosis not present

## 2018-11-28 DIAGNOSIS — Z86010 Personal history of colonic polyps: Secondary | ICD-10-CM | POA: Insufficient documentation

## 2018-11-28 DIAGNOSIS — M7989 Other specified soft tissue disorders: Secondary | ICD-10-CM | POA: Diagnosis not present

## 2018-11-28 DIAGNOSIS — E119 Type 2 diabetes mellitus without complications: Secondary | ICD-10-CM | POA: Insufficient documentation

## 2018-11-28 NOTE — Progress Notes (Signed)
[place], [place] [address]   CLINIC:  Medical Oncology/Hematology  PCP:  [name], MD [address], [address] [place] [address] [phone]   REASON FOR VISIT: Follow-up for multiple myeloma  CURRENT THERAPY: Observation   INTERVAL HISTORY:  [name] 73 y.o. female returns for routine follow-up for multiple myeloma.  She reports fatigue throughout the day.  I have recommended that she joined the gym and exercises daily.  Eyes any bright red bleeding per rectum or melena.  He denies any fevers night sweats chills or unexplained weight loss.  Despite her appetite being down she is maintaining her weight well.  Denies any nausea, vomiting, or diarrhea. Denies any new pains. Had not noticed any recent bleeding such as epistaxis, hematuria or hematochezia. Denies recent chest pain on exertion, shortness of breath on minimal exertion, pre-syncopal episodes, or palpitations. Denies any numbness or tingling in hands or feet. Denies any recent fevers, infections, or recent hospitalizations. Patient reports appetite at 0% and energy level at 0%.  She is maintaining her weight and eating well.    REVIEW OF SYSTEMS:  Review of Systems  Constitutional: Positive for fatigue.  Respiratory: Positive for shortness of breath.   Cardiovascular: Positive for leg swelling.  Neurological: Positive for headaches.  All other systems reviewed and are negative.    PAST MEDICAL/SURGICAL HISTORY:  Past Medical History:  Diagnosis Date  . Anemia   . Anxiety   . Anxiety disorder   . Arthritis   . Cancer ([place])    acute myeloma  . 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 Chronic back pain   . Depression   . Fatty liver   . GERD (gastroesophageal reflux disease)   . Headache   . History of adenomatous polyp of colon    2008  . History of kidney stones   . Hypercalcemia   . Hyperlipidemia   . Hypertension   . Left ureteral stone   .  Lipoma of arm 01/19/2015  . Lumbar disc disease with radiculopathy   . Nephrolithiasis   . Neuropathy, peripheral   . OSA treated with BiPAP    per study 2007  . Sciatica   . Shoulder pain, bilateral   . Sigmoid diverticulosis   . Type 2 diabetes mellitus ([place])    Past Surgical History:  Procedure Laterality Date  . BIOPSY N/A 03/17/2013   Procedure: GASTRIC BIOPSIES;  Surgeon: Danie Binder, MD;  Location: [facility];  Service: Endoscopy;  Laterality: N/A;  . BIOPSY  06/10/2015   Procedure: BIOPSY;  Surgeon: Danie Binder, MD;  [facility];  Service: Endoscopy;;  gastric biopsy  . CARDIAC CATHETERIZATION  05-11-2003  dr Shelva Majestic ([facility])   Abnormal cardiolite/   Normal coronary arteries and normal LVF,  ef  63%  . CARDIOVASCULAR STRESS TEST  01-01-2014   normal lexiscan cardiolite/  no ischemia/ infarct/  normal LV function and wall motion ,  ef 81%  . COLONOSCOPY  last one 10/02/2011   MOD [place] TICS, IH, NEXT TCS APR 2018  . COLONOSCOPY WITH PROPOFOL N/A 02/26/2017   Procedure: COLONOSCOPY WITH PROPOFOL;  Surgeon: Danie Binder, MD;  [facility];  Service: Endoscopy;  Laterality: N/A;  11:00am  . CYSTO/   RIGHT URETEROSOCOPY LASER LITHOTRIPSY STONE EXTRACTION  10-13-2004  . CYSTO/  RIGHT RETROGRADE PYELOGRAM/  PLACMENT RIGHT URETERAL STENT  01-10-2010  . CYSTOSCOPY W/ URETERAL  STENT PLACEMENT Right 08/19/2015   Procedure: CYSTOSCOPY WITH RETROGRADE PYELOGRAM/URETERAL STENT PLACEMENT;  Surgeon: Franchot Gallo, MD;  Location: [facility];  Service: Urology;  Laterality: Right;  . CYSTOSCOPY WITH RETROGRADE PYELOGRAM, URETEROSCOPY AND STENT PLACEMENT Left 10/21/2014   Procedure: CYSTOSCOPY WITH RETROGRADE PYELOGRAM, URETEROSCOPY AND STENT PLACEMENT;  Surgeon: Franchot Gallo, MD;  Location: [facility];  Service: Urology;  Laterality: Left;  . CYSTOSCOPY WITH RETROGRADE PYELOGRAM, URETEROSCOPY AND STENT PLACEMENT Right 11/22/2015    Procedure: CYSTOSCOPY, RIGHT URETERAL STENT REMOVAL; RIGHT RETROGRADE PYELOGRAM, RIGHT URETEROSCOPY WITH BALLOON DILATION; RIGHT URETERAL STENT PLACEMENT;  Surgeon: Franchot Gallo, MD;  Location: [facility];  Service: Urology;  Laterality: Right;  . CYSTOSCOPY/RETROGRADE/URETEROSCOPY/STONE EXTRACTION WITH BASKET Bilateral 08/02/2015   Procedure: CYSTOSCOPY; BILATERAL RETROGRADE PYELOGRAMS; BILATERAL URETEROSCOPY, STONE EXTRACTION WITH BASKET;  Surgeon: Franchot Gallo, MD;  Location: [facility];  Service: Urology;  Laterality: Bilateral;  . CYSTOSCOPY/RETROGRADE/URETEROSCOPY/STONE EXTRACTION WITH BASKET Right 06/28/2015   Procedure: CYSTOSCOPY/RETROGRADE/URETEROSCOPY/STONE EXTRACTION WITH BASKET, RIGHT URETERAL DOUBLE J STENT PLACEMENT;  Surgeon: Franchot Gallo, MD;  Location: [facility];  Service: Urology;  Laterality: Right;  . ESOPHAGOGASTRODUODENOSCOPY (EGD) WITH PROPOFOL N/A 03/17/2013   Procedure: ESOPHAGOGASTRODUODENOSCOPY (EGD) WITH PROPOFOL;  Surgeon: Danie Binder, MD;  [facility]: [facility];  Service: Endoscopy;  Laterality: N/A;  . ESOPHAGOGASTRODUODENOSCOPY (EGD) WITH PROPOFOL N/A 06/10/2015   Distal gastritis, distal esophageal stricture s/p dilation  . ESOPHAGOGASTRODUODENOSCOPY (EGD) WITH PROPOFOL N/A 02/26/2017   Procedure: ESOPHAGOGASTRODUODENOSCOPY (EGD) WITH PROPOFOL;  Surgeon: Danie Binder, MD;  [facility];  Service: Endoscopy;  Laterality: N/A;  . FLEXIBLE SIGMOIDOSCOPY  09/11/2011   [id number] Internal hemorrhoids  . HOLMIUM LASER APPLICATION Left 7/42/5956   Procedure: HOLMIUM LASER APPLICATION;  Surgeon: Franchot Gallo, MD;  Location[facility];  Service: Urology;  Laterality: Left;  . HOLMIUM LASER APPLICATION Right 3/87/5643   Procedure: HOLMIUM LASER APPLICATION;  Surgeon: Franchot Gallo, MD;  Location: [facility];  Service: Urology;  Laterality: Right;  . MASS EXCISION Left 03/04/2015   Procedure: EXCISION OF SOFT TISSUE NEOPLASM LEFT ARM;   Surgeon: Aviva Signs, MD;  Location: [facility];  Service: General;  Laterality: Left;  . PERCUTANEOUS NEPHROSTOLITHOTOMY Bilateral 12/  2011     [facility]  . POLYPECTOMY N/A 03/17/2013   Procedure: GASTRIC POLYPECTOMY;  Surgeon: Danie Binder, MD;  Location: [facility];  Service: Endoscopy;  Laterality: N/A;  . POLYPECTOMY  02/26/2017   Procedure: POLYPECTOMY;  Surgeon: Danie Binder, MD;  [facility];  Service: Endoscopy;;  polyp at cecum, ascending colon polyps x3, hepatic flexure polyps x8, transverse colon polyps x8   . REMOVAL RIGHT THIGH CYST  2006  . SAVORY DILATION N/A 03/17/2013   Procedure: SAVORY DILATION;  Surgeon: Danie Binder, MD;  Location: [facility];  Service: Endoscopy;  Laterality: N/A;  #12.8, 14, 15, 16 dilators used  . SAVORY DILATION N/A 06/10/2015   Procedure: SAVORY DILATION;  Surgeon: Danie Binder, MD;  [facility];  Service: Endoscopy;  Laterality: N/A;  . TRANSTHORACIC ECHOCARDIOGRAM  01-01-2014   mild LVH/  ef 32-95%/  grade I diastolic dysfunction/  trivial MR, TR, and PR  . VAGINAL HYSTERECTOMY  1970's     SOCIAL HISTORY:  Social History   Socioeconomic History  . Marital status: Divorced    Spouse name: Not on file  . Number of children: 2  . Years of education: Not on file  . Highest education level: Not on file  Occupational History  .  Occupation: retired   Scientific laboratory technician  . Financial resource strain: Not very hard  . Food insecurity    Worry: Never true    Inability: Never true  . Transportation needs    Medical: No    Non-medical: No  Tobacco Use  . Smoking status: Former Smoker    Packs/day: 1.00    Years: 20.00    Pack years: 20.00    Types: Cigarettes    Start date: 09/21/1974    Quit date: 09/20/1988    Years since quitting: 30.2  . Smokeless tobacco: Never Used  . Tobacco comment: Quit x 20 years  Substance and Sexual Activity  . Alcohol use: No    Alcohol/week: 0.0 standard drinks  . Drug use: No  . Sexual  activity: Never    Birth control/protection: Surgical  Lifestyle  . Physical activity    Days per week: 0 days    Minutes per session: 0 min  . Stress: Only a little  Relationships  . Social Herbalist on phone: Not on file    Gets together: Not on file    Attends religious service: Not on file    Active member of club or organization: Not on file    Attends meetings of clubs or organizations: Not on file    Relationship status: Divorced  . Intimate partner violence    Fear of current or ex partner: No    Emotionally abused: No    Physically abused: No    Forced sexual activity: No  Other Topics Concern  . Not on file  Social History Narrative   Patient is right handed   Patient drinks some caffeine daily.    FAMILY HISTORY:  Family History  Problem Relation Age of Onset  . Hypertension Mother   . Diabetes Mother   . Heart failure Mother   . Dementia Mother   . Emphysema Father   . Hypertension Father   . Diabetes Brother   . GER disease Brother   . Hypertension Sister   . Hypertension Sister   . Cancer Other        family history   . Diabetes Other        family history   . Heart defect Other        famiily history   . Arthritis Other        family history   . Anesthesia problems Neg Hx   . Hypotension Neg Hx   . Malignant hyperthermia Neg Hx   . Pseudochol deficiency Neg Hx   . Colon cancer Neg Hx     CURRENT MEDICATIONS:  Outpatient Encounter Medications as of 11/28/2018  Medication Sig Note  . ALPRAZolam (XANAX) 0.5 MG tablet Take one tablet at bedtime for sleep and anxiety, by mouth   . amLODipine (NORVASC) 10 MG tablet TAKE 1 TABLET BY MOUTH EVERY DAY (Patient taking differently: Take 10 mg by mouth daily. )   . aspirin EC 325 MG tablet Take 325 mg by mouth daily.   . calcium-vitamin D (OSCAL WITH D) 500-200 MG-UNIT tablet Take 1 tablet by mouth daily.   . diphenhydramine-acetaminophen (TYLENOL PM) 25-500 MG TABS tablet Take 1 tablet by  mouth 2 (two) times daily. 11/28/2018: Pt takes one tablet in the morning and 2 tablets at night  . docusate sodium (COLACE) 100 MG capsule Take 200 mg by mouth at bedtime.    . iron polysaccharides (NIFEREX) 150 MG capsule Take 150 mg by  mouth daily.   Marland Kitchen KLOR-CON M20 20 MEQ tablet TAKE 1 TABLET BY MOUTH EVERY DAY   . lovastatin (MEVACOR) 40 MG tablet TAKE 1 TABLET BY MOUTH EVERYDAY AT BEDTIME   . Melatonin 10 MG TABS Take 10 mg by mouth at bedtime.   . OXYGEN Inhale into the lungs daily. Bedtime   . pantoprazole (PROTONIX) 40 MG tablet TAKE 1 TABLET (40 MG TOTAL) BY MOUTH 2 (TWO) TIMES DAILY.   Marland Kitchen PARoxetine (PAXIL) 40 MG tablet TAKE 1 TABLET BY MOUTH EVERY DAY IN THE MORNING   . potassium chloride (K-DUR) 10 MEQ tablet Take 1 tablet (10 mEq total) by mouth daily.   . SYMBICORT 80-4.5 MCG/ACT inhaler INHALE 2 PUFFS INTO THE LUNGS TWICE A DAY   . TRADJENTA 5 MG TABS tablet TAKE 1 TABLET BY MOUTH EVERY DAY   . traMADol (ULTRAM) 50 MG tablet Take 1 tablet (50 mg total) by mouth every 6 (six) hours as needed. 10/06/2018: At bedtime  . traZODone (DESYREL) 100 MG tablet TAKE 1 TABLET BY MOUTH EVERYDAY AT BEDTIME   . vitamin B-12 (CYANOCOBALAMIN) 100 MCG tablet Take 100 mcg by mouth daily.   . [DISCONTINUED] iron polysaccharides (FERREX 150) 150 MG capsule TAKE ONE CAPSULE BY MOUTH EVERY DAY    No facility-administered encounter medications on file as of 11/28/2018.     ALLERGIES:  Allergies  Allergen Reactions  . Ace Inhibitors Cough  . Keflex [Cephalexin] Diarrhea and Nausea And Vomiting  . Nitrofurantoin Diarrhea and Nausea And Vomiting  . Penicillins Hives and Other (See Comments)    Reaction:  Blisters on hands/feet  Has patient had a PCN reaction causing immediate rash, facial/tongue/throat swelling, SOB or lightheadedness with hypotension: No Has patient had a PCN reaction causing severe rash involving mucus membranes or skin necrosis: No Has patient had a PCN reaction that required  hospitalization No Has patient had a PCN reaction occurring within the last 10 years: No If all of the above answers are "NO", then may proceed with Cephalosporin use.     PHYSICAL EXAM:  ECOG Performance status: 1  Vitals:   11/28/18 1044  BP: 90/66  Pulse: 89  Resp: 16  Temp: 98.3 F (36.8 C)  SpO2: 100%   Filed Weights   11/28/18 1044  Weight: 208 lb 3.2 oz (94.4 kg)    Physical Exam Constitutional:      Appearance: Normal appearance. She is normal weight.  Cardiovascular:     Rate and Rhythm: Normal rate and regular rhythm.     Heart sounds: Normal heart sounds.  Pulmonary:     Effort: Pulmonary effort is normal.     Breath sounds: Normal breath sounds.  Abdominal:     General: Bowel sounds are normal.     Palpations: Abdomen is soft.  Musculoskeletal: Normal range of motion.  Skin:    General: Skin is warm and dry.  Neurological:     Mental Status: She is alert and oriented to person, place, and time. Mental status is at baseline.  Psychiatric:        Mood and Affect: Mood normal.        Behavior: Behavior normal.        Thought Content: Thought content normal.        Judgment: Judgment normal.      LABORATORY DATA:  I have reviewed the labs as listed.  CBC    Component Value Date/Time   WBC 5.3 11/19/2018 1213   RBC 4.43 11/19/2018  1213   HGB 13.8 11/19/2018 1213   HCT 43.6 11/19/2018 1213   PLT 274 11/19/2018 1213   MCV 98.4 11/19/2018 1213   MCH 31.2 11/19/2018 1213   MCHC 31.7 11/19/2018 1213   RDW 15.1 11/19/2018 1213   LYMPHSABS 1.2 11/19/2018 1213   MONOABS 0.5 11/19/2018 1213   EOSABS 0.2 11/19/2018 1213   BASOSABS 0.0 11/19/2018 1213   CMP Latest Ref Rng & Units 11/19/2018 07/16/2018 07/16/2018  Glucose 70 - 99 mg/dL 113(H) 101(H) 95  BUN 8 - 23 mg/dL 14 16 17   Creatinine 0.44 - 1.00 mg/dL 1.55(H) 1.68(H) 1.75(H)  Sodium 135 - 145 mmol/L 137 135 133(L)  Potassium 3.5 - 5.1 mmol/L 4.8 3.9 4.2  Chloride 98 - 111 mmol/L 104 100 102  CO2  22 - 32 mmol/L 25 23 23   Calcium 8.9 - 10.3 mg/dL 9.9 10.0 9.8  Total Protein 6.5 - 8.1 g/dL 8.9(H) 8.5(H) 8.3(H)  Total Bilirubin 0.3 - 1.2 mg/dL 0.2(L) 0.3 0.4  Alkaline Phos 38 - 126 U/L 62 62 58  AST 15 - 41 U/L 19 32 33  ALT 0 - 44 U/L 14 23 24        DIAGNOSTIC IMAGING:  I have independently reviewed the skeletal survey and discussed with the patient.    I personally performed a face-to-face visit  All questions were answered to patient's stated satisfaction. Encouraged patient to call with any new concerns or questions before his next visit to the cancer center and we can certain see him sooner, if needed.     ASSESSMENT & PLAN:   Smoldering myeloma ([facility]) 1.  IgG kappa smoldering multiple myeloma: - Bone marrow biopsy on 02/19/2017 showed trilineage hematopoiesis plasma cells of 14%.  Cytogenetics revealed 65, XX.  FISH panel was normal. -Reviewed her blood work from 11/19/2018 showed M spike is stable at 1.4.  Free light chain ratio has increased to 3.25. Previously was 2.47.  Kappa light chains of 49.1.  Previously it was 43.9.  Calcium was 9.9 and creatinine was 1.55.  Hemoglobin is 13.8.  LDH was normal at 99. -She does not report any new onset bone pains. -Skeletal survey was done on 11/21/2018 which showed no acute bony abnormalities or focal lytic lesions. -She reports she is fatigued throughout the day.  She lives alone and has no motivation to get up and move.  I recommended she join the gym and set an alarm on her phone to exercise daily. -I recommend that she follows up in 4 months with repeat blood work      Orders placed this encounter:  Orders Placed This Encounter  Procedures  . Lactate dehydrogenase  . Protein electrophoresis, serum  . Kappa/lambda light chains  . CBC with Differential/Platelet  . Comprehensive metabolic panel  . VITAMIN D 25 Hydroxy (Vit-D Deficiency, Fractures)  . Vitamin B12      Francene Finders, FNP-C [place]  [phone]

## 2018-11-28 NOTE — Assessment & Plan Note (Addendum)
1.  IgG kappa smoldering multiple myeloma: - Bone marrow biopsy on 02/19/2017 showed trilineage hematopoiesis plasma cells of 14%.  Cytogenetics revealed 16, XX.  FISH panel was normal. -Reviewed her blood work from 11/19/2018 showed M spike is stable at 1.4.  Free light chain ratio has increased to 3.25. Previously was 2.47.  Kappa light chains of 49.1.  Previously it was 43.9.  Calcium was 9.9 and creatinine was 1.55.  Hemoglobin is 13.8.  LDH was normal at 99. -She does not report any new onset bone pains. -Skeletal survey was done on 11/21/2018 which showed no acute bony abnormalities or focal lytic lesions. -She reports she is fatigued throughout the day.  She lives alone and has no motivation to get up and move.  I recommended she join the gym and set an alarm on her phone to exercise daily. -I recommend that she follows up in 4 months with repeat blood work

## 2018-11-28 NOTE — Patient Instructions (Signed)
[facility] at [facility] Discharge Instructions Follow up in 4 months with labs    Thank you for choosing [facility] at [facility] to provide your oncology and hematology care.  To afford each patient quality time with our provider, please arrive at least 15 minutes before your scheduled appointment time.   If you have a lab appointment with the Cancer Center please come in thru the  Main Entrance and check in at the main information desk  You need to re-schedule your appointment should you arrive 10 or more minutes late.  We strive to give you quality time with our providers, and arriving late affects you and other patients whose appointments are after yours.  Also, if you no show three or more times for appointments you may be dismissed from the clinic at the providers discretion.     Again, thank you for choosing [facility].  Our hope is that these requests will decrease the amount of time that you wait before being seen by our physicians.       _____________________________________________________________  Should you have questions after your visit to [facility], please contact our office at [phone] between the hours of 8:00 a.m. and 4:30 p.m.  Voicemails left after 4:00 p.m. will not be returned until the following business day.  For prescription refill requests, have your pharmacy contact our office and allow 72 hours.    Cancer Center Support Programs:   > Cancer Support Group  2nd Tuesday of the month 1pm-2pm, Journey Room    

## 2018-12-02 ENCOUNTER — Encounter: Payer: Self-pay | Admitting: Gastroenterology

## 2018-12-09 ENCOUNTER — Ambulatory Visit: Payer: Medicare Other | Admitting: Cardiology

## 2018-12-10 ENCOUNTER — Telehealth (INDEPENDENT_AMBULATORY_CARE_PROVIDER_SITE_OTHER): Payer: Medicare Other | Admitting: Cardiology

## 2018-12-10 ENCOUNTER — Encounter: Payer: Self-pay | Admitting: Cardiology

## 2018-12-10 VITALS — BP 118/61 | HR 88 | Ht 66.5 in | Wt 207.0 lb

## 2018-12-10 DIAGNOSIS — I10 Essential (primary) hypertension: Secondary | ICD-10-CM

## 2018-12-10 DIAGNOSIS — R0602 Shortness of breath: Secondary | ICD-10-CM | POA: Diagnosis not present

## 2018-12-10 DIAGNOSIS — R600 Localized edema: Secondary | ICD-10-CM

## 2018-12-10 MED ORDER — FUROSEMIDE 40 MG PO TABS: ORAL_TABLET | ORAL | 1 refills | Status: DC

## 2018-12-10 NOTE — Progress Notes (Signed)
Virtual Visit via Telephone Note   This visit type was conducted due to national recommendations for restrictions regarding the COVID-19 Pandemic (e.g. social distancing) in an effort to limit this patient's exposure and mitigate transmission in our community.  Due to her co-morbid illnesses, this patient is at least at moderate risk for complications without adequate follow up.  This format is felt to be most appropriate for this patient at this time.  The patient did not have access to video technology/had technical difficulties with video requiring transitioning to audio format only (telephone).  All issues noted in this document were discussed and addressed.  No physical exam could be performed with this format.  Please refer to the patient's chart for her  consent to telehealth for [facility].   Date:  12/10/2018   ID:  Cheryl Abbott, DOB May 23, 1946, MRN [id number]  Patient Location: Home Provider Location: Office  PCP:  [name], MD  Cardiologist:  Carlyle Dolly, MD  Electrophysiologist:  None   Evaluation Performed:  Follow-Up Visit  Chief Complaint:  Follow up  History of Present Illness:    Cheryl Abbott is a 73 y.o. female seen today for follow up of the following medical problems.  1. Chest pain/SOB - echo 02/2016 LVEF 60-65%, grade I diastoilc dysfunction -12/2013 nuclear stress without ischemia, low risk. 07/2016 PFTs: mild restrictive disease  03/2018 LVEF 55-60%, no WMAs, indeterminate diastolic function - still some SOB at times, overall stable. No recent chest pain   2.History ofTIA  On ASA/Clopidogrel per neurolgoy   3. History of multiple myeloma  4. Fatigue - see in ER 03/11/18 with generalized fatigue   5. HTN - compliant with meds, controlled at other recent visits.    7. OSA - Dr Moshe Cipro having her establish with Dr Luan Pulling   8. HL - 05/2018 TC 172 HDL 47 TG 120 LDL 103 - 11/2018 TC 162 TG 121 HDL 47 LDL 91 -  compliant with statin   The patient does not have symptoms concerning for COVID-19 infection (fever, chills, cough, or new shortness of breath).    Past Medical History:  Diagnosis Date  . Anemia   . Anxiety   . Anxiety disorder   . Arthritis   . Cancer ([place])    acute myeloma  . 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 Chronic back pain   . Depression   . Fatty liver   . GERD (gastroesophageal reflux disease)   . Headache   . History of adenomatous polyp of colon    2008  . History of kidney stones   . Hypercalcemia   . Hyperlipidemia   . Hypertension   . Left ureteral stone   . Lipoma of arm 01/19/2015  . Lumbar disc disease with radiculopathy   . Nephrolithiasis   . Neuropathy, peripheral   . OSA treated with BiPAP    per study 2007  . Sciatica   . Shoulder pain, bilateral   . Sigmoid diverticulosis   . Type 2 diabetes mellitus ([place])    Past Surgical History:  Procedure Laterality Date  . BIOPSY N/A 03/17/2013   Procedure: GASTRIC BIOPSIES;  Surgeon: Danie Binder, MD;  Location: [facility];  Service: Endoscopy;  Laterality: N/A;  . BIOPSY  06/10/2015   Procedure: BIOPSY;  Surgeon: Danie Binder, MD;  [facility];  Service: Endoscopy;;  gastric biopsy  . CARDIAC CATHETERIZATION  05-11-2003  dr Shelva Majestic ( heart center)   Abnormal  cardiolite/   Normal coronary arteries and normal LVF,  ef  63%  . CARDIOVASCULAR STRESS TEST  01-01-2014   normal lexiscan cardiolite/  no ischemia/ infarct/  normal LV function and wall motion ,  ef 81%  . COLONOSCOPY  last one 10/02/2011   MOD [place] TICS, IH, NEXT TCS APR 2018  . COLONOSCOPY WITH PROPOFOL N/A 02/26/2017   Procedure: COLONOSCOPY WITH PROPOFOL;  Surgeon: Danie Binder, MD;  [facility];  Service: Endoscopy;  Laterality: N/A;  11:00am  . CYSTO/   RIGHT URETEROSOCOPY LASER LITHOTRIPSY STONE EXTRACTION  10-13-2004  . CYSTO/  RIGHT RETROGRADE PYELOGRAM/  PLACMENT RIGHT URETERAL STENT   01-10-2010  . CYSTOSCOPY W/ URETERAL STENT PLACEMENT Right 08/19/2015   Procedure: CYSTOSCOPY WITH RETROGRADE PYELOGRAM/URETERAL STENT PLACEMENT;  Surgeon: Franchot Gallo, MD;  Location: [facility];  Service: Urology;  Laterality: Right;  . CYSTOSCOPY WITH RETROGRADE PYELOGRAM, URETEROSCOPY AND STENT PLACEMENT Left 10/21/2014   Procedure: CYSTOSCOPY WITH RETROGRADE PYELOGRAM, URETEROSCOPY AND STENT PLACEMENT;  Surgeon: Franchot Gallo, MD;  Location: [facility];  Service: Urology;  Laterality: Left;  . CYSTOSCOPY WITH RETROGRADE PYELOGRAM, URETEROSCOPY AND STENT PLACEMENT Right 11/22/2015   Procedure: CYSTOSCOPY, RIGHT URETERAL STENT REMOVAL; RIGHT RETROGRADE PYELOGRAM, RIGHT URETEROSCOPY WITH BALLOON DILATION; RIGHT URETERAL STENT PLACEMENT;  Surgeon: Franchot Gallo, MD;  [facility];  Service: Urology;  Laterality: Right;  . CYSTOSCOPY/RETROGRADE/URETEROSCOPY/STONE EXTRACTION WITH BASKET Bilateral 08/02/2015   Procedure: CYSTOSCOPY; BILATERAL RETROGRADE PYELOGRAMS; BILATERAL URETEROSCOPY, STONE EXTRACTION WITH BASKET;  Surgeon: Franchot Gallo, MD;  [facility]: [facility];  Service: Urology;  Laterality: Bilateral;  . CYSTOSCOPY/RETROGRADE/URETEROSCOPY/STONE EXTRACTION WITH BASKET Right 06/28/2015   Procedure: CYSTOSCOPY/RETROGRADE/URETEROSCOPY/STONE EXTRACTION WITH BASKET, RIGHT URETERAL DOUBLE J STENT PLACEMENT;  Surgeon: Franchot Gallo, MD;  [facility];  Service: Urology;  Laterality: Right;  . ESOPHAGOGASTRODUODENOSCOPY (EGD) WITH PROPOFOL N/A 03/17/2013   Procedure: ESOPHAGOGASTRODUODENOSCOPY (EGD) WITH PROPOFOL;  Surgeon: Danie Binder, MD;  [facility];  Service: Endoscopy;  Laterality: N/A;  . ESOPHAGOGASTRODUODENOSCOPY (EGD) WITH PROPOFOL N/A 06/10/2015   Distal gastritis, distal esophageal stricture s/p dilation  . ESOPHAGOGASTRODUODENOSCOPY (EGD) WITH PROPOFOL N/A 02/26/2017   Procedure: ESOPHAGOGASTRODUODENOSCOPY (EGD) WITH PROPOFOL;  Surgeon: Danie Binder, MD;  [facility];  Service: Endoscopy;  Laterality: N/A;  . FLEXIBLE SIGMOIDOSCOPY  09/11/2011   DQQ:[id number] Internal hemorrhoids  . HOLMIUM LASER APPLICATION Left 06/29/4172   Procedure: HOLMIUM LASER APPLICATION;  Surgeon: Franchot Gallo, MD;  [facility];  Service: Urology;  Laterality: Left;  . HOLMIUM LASER APPLICATION Right 0/81/4481   Procedure: HOLMIUM LASER APPLICATION;  Surgeon: Franchot Gallo, MD;  [facility];  Service: Urology;  Laterality: Right;  . MASS EXCISION Left 03/04/2015   Procedure: EXCISION OF SOFT TISSUE NEOPLASM LEFT ARM;  Surgeon: Aviva Signs, MD;  Location: [facility];  Service: General;  Laterality: Left;  . PERCUTANEOUS NEPHROSTOLITHOTOMY Bilateral 12/  2011     [facility]  . POLYPECTOMY N/A 03/17/2013   Procedure: GASTRIC POLYPECTOMY;  Surgeon: Danie Binder, MD;  [facility];  Service: Endoscopy;  Laterality: N/A;  . POLYPECTOMY  02/26/2017   Procedure: POLYPECTOMY;  Surgeon: Danie Binder, MD;  [facility];  Service: Endoscopy;;  polyp at cecum, ascending colon polyps x3, hepatic flexure polyps x8, transverse colon polyps x8   . REMOVAL RIGHT THIGH CYST  2006  . SAVORY DILATION N/A 03/17/2013   Procedure: SAVORY DILATION;  Surgeon: Danie Binder, MD;  Location: [facility];  Service:  Endoscopy;  Laterality: N/A;  #12.8, 14, 15, 16 dilators used  . SAVORY DILATION N/A 06/10/2015   Procedure: SAVORY DILATION;  Surgeon: Danie Binder, MD;  [facility];  Service: Endoscopy;  Laterality: N/A;  . TRANSTHORACIC ECHOCARDIOGRAM  01-01-2014   mild LVH/  ef 41-93%/  grade I diastolic dysfunction/  trivial MR, TR, and PR  . VAGINAL HYSTERECTOMY  1970's     No outpatient medications have been marked as taking for the 12/10/18 encounter (Appointment) with Arnoldo Lenis, MD.     Allergies:   Ace inhibitors, Keflex [cephalexin], Nitrofurantoin, and Penicillins   Social History   Tobacco Use  .  Smoking status: Former Smoker    Packs/day: 1.00    Years: 20.00    Pack years: 20.00    Types: Cigarettes    Start date: 09/21/1974    Quit date: 09/20/1988    Years since quitting: 30.2  . Smokeless tobacco: Never Used  . Tobacco comment: Quit x 20 years  Substance Use Topics  . Alcohol use: No    Alcohol/week: 0.0 standard drinks  . Drug use: No     Family Hx: The patient's family history includes Arthritis in an other family member; Cancer in an other family member; Dementia in her mother; Diabetes in her brother, mother, and another family member; Emphysema in her father; GER disease in her brother; Heart defect in an other family member; Heart failure in her mother; Hypertension in her father, mother, sister, and sister. There is no history of Anesthesia problems, Hypotension, Malignant hyperthermia, Pseudochol deficiency, or Colon cancer.  ROS:   Please see the history of present illness.    All other systems reviewed and are negative.   Prior CV studies:   The following studies were reviewed today:  03/2018 echo Study Conclusions  - Left ventricle: The cavity size was normal. Wall thickness was   increased in a pattern of mild LVH. Systolic function was normal.   The estimated ejection fraction was in the range of 55% to 60%.   Wall motion was normal; there were no regional wall motion   abnormalities. Indeterminate diastolic function. - Right ventricle: The cavity size was mildly dilated. - Right atrium: Central venous pressure (est): 3 mm Hg. - Atrial septum: No defect or patent foramen ovale was identified. - Tricuspid valve: There was trivial regurgitation. - Pulmonary arteries: PA peak pressure: 21 mm Hg (S). - Pericardium, extracardiac: There was no pericardial effusion.    Labs/Other Tests and Data Reviewed:    EKG:  n/a  Recent Labs: 12/24/2017: TSH 1.17 05/21/2018: Magnesium 2.3 11/19/2018: ALT 14; BUN 14; Creatinine, Ser 1.55; Hemoglobin 13.8;  Platelets 274; Potassium 4.8; Sodium 137   Recent Lipid Panel Lab Results  Component Value Date/Time   CHOL 162 11/19/2018 12:13 PM   TRIG 121 11/19/2018 12:13 PM   HDL 47 11/19/2018 12:13 PM   CHOLHDL 3.4 11/19/2018 12:13 PM   LDLCALC 91 11/19/2018 12:13 PM   LDLCALC 103 (H) 05/21/2018 01:08 PM    Wt Readings from Last 3 Encounters:  11/28/18 208 lb 3.2 oz (94.4 kg)  10/06/18 204 lb (92.5 kg)  07/29/18 204 lb 8 oz (92.8 kg)     Objective:    Vital Signs:   Today's Vitals   12/10/18 1431  BP: 118/61  Pulse: 88  Weight: 207 lb (93.9 kg)  Height: 5' 6.5" (1.689 m)   Body mass index is 32.91 kg/m.  Normal affect. Normal speech  pattern and tone. No audible signs of SOb or wheezin. Comfortable, no apparent distress.   ASSESSMENT & PLAN:    1. SOB and fatigue - long history of SOB, fatigue -previous unnremarkable cardiac and pulmonary workupa few years ago - symptoms overall stable. Has had some increased LE edema, trial of lasix 55m prn.   2. HTN - at goal, continue current meds  3. LE edema - start lasix 444mprn. Has has some issues with cramping on diuretics before, will see if she can tolerate prn doses.    COVID-19 Education: The signs and symptoms of COVID-19 were discussed with the patient and how to seek care for testing (follow up with PCP or arrange E-visit).  The importance of social distancing was discussed today.  Time:   Today, I have spent 15 minutes with the patient with telehealth technology discussing the above problems.     Medication Adjustments/Labs and Tests Ordered: Current medicines are reviewed at length with the patient today.  Concerns regarding medicines are outlined above.   Tests Ordered: No orders of the defined types were placed in this encounter.   Medication Changes: No orders of the defined types were placed in this encounter.   Follow Up:  Virtual Visit in 1 month(s) to reasses edema  Signed, BrCarlyle DollyMD   12/10/2018 12:31 PM    [facility]

## 2018-12-10 NOTE — Patient Instructions (Signed)
Your physician recommends that you schedule a follow-up appointment in: [place] has recommended you make the following change in your medication:   START LASIX 40 MG AS NEEDED FOR SWELLING  Thank you for choosing [facility]!!

## 2018-12-16 ENCOUNTER — Other Ambulatory Visit: Payer: Self-pay | Admitting: Family Medicine

## 2018-12-18 DIAGNOSIS — R0902 Hypoxemia: Secondary | ICD-10-CM | POA: Diagnosis not present

## 2018-12-19 ENCOUNTER — Other Ambulatory Visit: Payer: Medicare Other

## 2018-12-19 ENCOUNTER — Other Ambulatory Visit: Payer: Self-pay

## 2018-12-19 DIAGNOSIS — R6889 Other general symptoms and signs: Secondary | ICD-10-CM | POA: Diagnosis not present

## 2018-12-19 DIAGNOSIS — Z20822 Contact with and (suspected) exposure to covid-19: Secondary | ICD-10-CM

## 2018-12-23 ENCOUNTER — Encounter: Payer: Medicare Other | Admitting: Family Medicine

## 2018-12-24 ENCOUNTER — Ambulatory Visit (INDEPENDENT_AMBULATORY_CARE_PROVIDER_SITE_OTHER): Payer: Medicare Other | Admitting: Family Medicine

## 2018-12-24 ENCOUNTER — Other Ambulatory Visit: Payer: Self-pay

## 2018-12-24 ENCOUNTER — Encounter: Payer: Self-pay | Admitting: Family Medicine

## 2018-12-24 VITALS — BP 128/82 | HR 96 | Temp 97.2°F | Resp 15 | Ht 66.0 in | Wt 207.0 lb

## 2018-12-24 DIAGNOSIS — E1121 Type 2 diabetes mellitus with diabetic nephropathy: Secondary | ICD-10-CM | POA: Diagnosis not present

## 2018-12-24 DIAGNOSIS — N183 Chronic kidney disease, stage 3 unspecified: Secondary | ICD-10-CM

## 2018-12-24 DIAGNOSIS — Z0000 Encounter for general adult medical examination without abnormal findings: Secondary | ICD-10-CM

## 2018-12-24 DIAGNOSIS — G8929 Other chronic pain: Secondary | ICD-10-CM

## 2018-12-24 DIAGNOSIS — E669 Obesity, unspecified: Secondary | ICD-10-CM

## 2018-12-24 DIAGNOSIS — D472 Monoclonal gammopathy: Secondary | ICD-10-CM

## 2018-12-24 DIAGNOSIS — C9000 Multiple myeloma not having achieved remission: Secondary | ICD-10-CM

## 2018-12-24 DIAGNOSIS — F411 Generalized anxiety disorder: Secondary | ICD-10-CM

## 2018-12-24 DIAGNOSIS — M25512 Pain in left shoulder: Secondary | ICD-10-CM | POA: Diagnosis not present

## 2018-12-24 DIAGNOSIS — I10 Essential (primary) hypertension: Secondary | ICD-10-CM

## 2018-12-24 NOTE — Progress Notes (Signed)
Health Maintenance reviewed -  Immunization History  Administered Date(s) Administered  . Influenza Split 03/17/2015  . Influenza Whole 04/15/2007, 03/17/2009  . Influenza,inj,Quad PF,6+ Mos 02/04/2014, 02/12/2017, 02/17/2018  . Pneumococcal Conjugate-13 12/21/2013  . Pneumococcal Polysaccharide-23 01/19/2004, 05/23/2010  . Td 06/07/2003  . Tdap 06/14/2011  . Zoster 02/02/2008   Last Pap smear: aged out Last mammogram: 04/2018 Last colonoscopy: 02/2017 Last DEXA: 04/2018 Dentist: Needs to find a new one  Ophtho: 2019 Exercise: Does not exercise  Other doctors caring for patient include:  Patient Care Team: [name], MD as PCP - General Branch, Alphonse Guild, MD as PCP - Cardiology (Cardiology) Danie Binder, MD (Gastroenterology) Irine Seal, MD as Attending Physician (Urology) Kathrynn Ducking, MD (Neurology) Franchot Gallo, MD as Consulting Physician (Urology) Rutherford Guys, MD as Consulting Physician (Ophthalmology)  End of Life Discussion:  Patient does not have a living will and medical power of attorney Desires daughter to be over her care. Would like paperwork today.    Code Status: Prior   Subjective:   HPI  Cheryl Abbott is a 73 y.o. female who presents for annual wellness visit and follow-up on chronic medical conditions.  She has the following concerns: Left shoulder pain on and off for the last month. Feels like it did when Dr Aline Brochure told her she had bursitis. Comes mostly at night, reports it aches all night hard to sleep on.  Described as throbbing and aching.  Has difficulty laying in the bed with it.  During the day she has pain about a 2 out of 10.  Worse at night.  Would like to see about getting injections Ortho.  Advised to contact pediatrics since she was just recently there in December for follow-up for injections.  Overall does not have any other concerns or needs today. Today patient denies signs and symptoms of COVID  19 infection including fever, chills, cough, shortness of breath, and headache. Past Medical, Surgical, Social History, Allergies, and Medications have been Reviewed.  Past Medical History:  Diagnosis Date  . Anemia   . Anxiety   . Anxiety disorder   . Arthritis   . Cancer ([facility])    acute myeloma  . 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 Chronic back pain   . Depression   . Fatty liver   . GERD (gastroesophageal reflux disease)   . Headache   . History of adenomatous polyp of colon    2008  . History of kidney stones   . Hypercalcemia   . Hyperlipidemia   . Hypertension   . Left ureteral stone   . Lipoma of arm 01/19/2015  . Lumbar disc disease with radiculopathy   . Nephrolithiasis   . Neuropathy, peripheral   . OSA treated with BiPAP    per study 2007  . Sciatica   . Shoulder pain, bilateral   . Sigmoid diverticulosis   . Type 2 diabetes mellitus ([place])     Current Outpatient Medications on File Prior to Visit  Medication Sig Dispense Refill  . ALPRAZolam (XANAX) 0.5 MG tablet Take one tablet at bedtime for sleep and anxiety, by mouth 30 tablet 5  . aspirin EC 325 MG tablet Take 325 mg by mouth daily.    . calcium-vitamin D (OSCAL WITH D) 500-200 MG-UNIT tablet Take 1 tablet by mouth daily.    . diphenhydramine-acetaminophen (TYLENOL PM) 25-500 MG TABS tablet Take 1 tablet by mouth 2 (two) times daily.    Marland Kitchen  furosemide (LASIX) 40 MG tablet TAKE 1 TABLET DAILY AS NEEDED FOR SWELLING 90 tablet 1  . iron polysaccharides (NIFEREX) 150 MG capsule Take 150 mg by mouth daily.    Marland Kitchen lovastatin (MEVACOR) 40 MG tablet TAKE 1 TABLET BY MOUTH EVERYDAY AT BEDTIME (Patient taking differently: 40 mg. ) 90 tablet 1  . Melatonin 10 MG TABS Take 10 mg by mouth at bedtime.    . OXYGEN Inhale into the lungs daily. Bedtime    . pantoprazole (PROTONIX) 40 MG tablet TAKE 1 TABLET (40 MG TOTAL) BY MOUTH 2 (TWO) TIMES DAILY. 180 tablet 1  . PARoxetine (PAXIL) 40 MG tablet TAKE 1 TABLET BY  MOUTH EVERY DAY IN THE MORNING 90 tablet 0  . potassium chloride (K-DUR) 10 MEQ tablet Take 1 tablet (10 mEq total) by mouth daily. 3 tablet 0  . SYMBICORT 80-4.5 MCG/ACT inhaler INHALE 2 PUFFS INTO THE LUNGS TWICE A DAY 30.6 Inhaler 1  . TRADJENTA 5 MG TABS tablet TAKE 1 TABLET BY MOUTH EVERY DAY 90 tablet 1  . traMADol (ULTRAM) 50 MG tablet Take 1 tablet (50 mg total) by mouth every 6 (six) hours as needed. 20 tablet 0  . vitamin B-12 (CYANOCOBALAMIN) 100 MCG tablet Take 100 mcg by mouth daily.    Marland Kitchen amLODipine (NORVASC) 10 MG tablet TAKE 1 TABLET BY MOUTH EVERY DAY (Patient taking differently: Take 10 mg by mouth daily. ) 90 tablet 3   No current facility-administered medications on file prior to visit.    Allergies  Allergen Reactions  . Ace Inhibitors Cough  . Keflex [Cephalexin] Diarrhea and Nausea And Vomiting  . Nitrofurantoin Diarrhea and Nausea And Vomiting  . Penicillins Hives and Other (See Comments)    Reaction:  Blisters on hands/feet  Has patient had a PCN reaction causing immediate rash, facial/tongue/throat swelling, SOB or lightheadedness with hypotension: No Has patient had a PCN reaction causing severe rash involving mucus membranes or skin necrosis: No Has patient had a PCN reaction that required hospitalization No Has patient had a PCN reaction occurring within the last 10 years: No If all of the above answers are "NO", then may proceed with Cephalosporin use.      ROS Review Of Systems  Review of Systems  Constitutional: Negative for chills and fever.  HENT: Negative.   Eyes: Negative.  Negative for visual disturbance.  Respiratory: Negative.  Negative for cough and shortness of breath.   Cardiovascular: Negative.   Gastrointestinal: Positive for constipation.  Endocrine: Negative.  Negative for polydipsia, polyphagia and polyuria.  Genitourinary: Negative.   Musculoskeletal: Positive for arthralgias.  Skin: Negative.   Allergic/Immunologic: Negative.    Neurological: Negative.  Negative for dizziness and headaches.  Hematological: Negative.   Psychiatric/Behavioral: Negative.   All other systems reviewed and are negative.   Objective:   PHYSICAL EXAM:  BP 128/82   Pulse 96   Temp (!) 97.2 F (36.2 C)   Resp 15   Ht 5\' 6"  (1.676 m)   Wt 207 lb (93.9 kg)   SpO2 96%   BMI 33.41 kg/m    Physical Exam Vitals signs and nursing note reviewed.  Constitutional:      General: She is not in acute distress.    Appearance: Normal appearance. She is obese.  HENT:     Head: Normocephalic and atraumatic.     Right Ear: Tympanic membrane, ear canal and external ear normal. There is no impacted cerumen.     Left Ear: Tympanic membrane,  ear canal and external ear normal. There is no impacted cerumen.     Nose: Nose normal. No congestion.     Mouth/Throat:     Mouth: Mucous membranes are moist.     Pharynx: Oropharynx is clear.  Eyes:     General: Lids are normal.        Right eye: No discharge.        Left eye: No discharge.     Extraocular Movements: Extraocular movements intact.     Conjunctiva/sclera: Conjunctivae normal.     Pupils: Pupils are equal, round, and reactive to light.     Comments: Growth on Right eye lid   Neck:     Musculoskeletal: Full passive range of motion without pain, normal range of motion and neck supple.     Thyroid: No thyroid mass, thyromegaly or thyroid tenderness.  Cardiovascular:     Rate and Rhythm: Normal rate and regular rhythm.     Pulses: Normal pulses.          Radial pulses are 2+ on the right side and 2+ on the left side.       Dorsalis pedis pulses are 2+ on the right side and 2+ on the left side.     Heart sounds: Normal heart sounds.  Pulmonary:     Effort: Pulmonary effort is normal.     Breath sounds: Normal breath sounds.  Abdominal:     General: Abdomen is flat. Bowel sounds are normal.     Palpations: Abdomen is soft.  Musculoskeletal: Normal range of motion.     Right lower  leg: No edema.     Left lower leg: No edema.  Lymphadenopathy:     Cervical: No cervical adenopathy.  Skin:    General: Skin is warm and dry.     Capillary Refill: Capillary refill takes less than 2 seconds.  Neurological:     General: No focal deficit present.     Mental Status: She is alert and oriented to person, place, and time. Mental status is at baseline.     Cranial Nerves: Cranial nerves are intact.     Sensory: Sensation is intact.     Motor: Motor function is intact.     Coordination: Coordination is intact.     Gait: Gait is intact.     Deep Tendon Reflexes: Reflexes are normal and symmetric.  Psychiatric:        Attention and Perception: Attention and perception normal.        Mood and Affect: Mood and affect normal.        Speech: Speech normal.        Behavior: Behavior normal. Behavior is cooperative.        Thought Content: Thought content normal.        Cognition and Memory: Cognition and memory normal.        Judgment: Judgment normal.    Diabetic Foot Form - Detailed   Diabetic Foot Exam - detailed Diabetic Foot exam was performed with the following findings: Yes 12/24/2018  2:30 PM  Visual Foot Exam completed.: Yes  Can the patient see the bottom of their feet?: No Are the shoes appropriate in style and fit?: No Is there swelling or and abnormal foot shape?: No Is there a claw toe deformity?: No Is there elevated skin temparature?: No Is there foot or ankle muscle weakness?: No Normal Range of Motion: No Right posterior Tibialias: Present Left posterior Tibialias: Present  Right Dorsalis Pedis:  Present Left Dorsalis Pedis: Present  Semmes-Weinstein Monofilament Test      GAD 7 : Generalized Anxiety Score 12/24/2018  Nervous, Anxious, on Edge 2  Control/stop worrying 1  Worry too much - different things 2  Trouble relaxing 1  Restless 2  Easily annoyed or irritable 1  Afraid - awful might happen 2  Total GAD 7 Score 11  Anxiety Difficulty Somewhat  difficult     Depression Screening  Depression screen [facility] 2/9 12/24/2018 05/19/2018 03/24/2018 02/17/2018 12/11/2017  Decreased Interest 2 2 2 2 2   Down, Depressed, Hopeless 2 2 2 2 2   PHQ - 2 Score 4 4 4 4 4   Altered sleeping 0 0 1 2 2   Tired, decreased energy 1 3 1 1 3   Change in appetite 2 3 1 1 1   Feeling bad or failure about yourself  0 0 2 0 3  Trouble concentrating 0 0 0 0 3  Moving slowly or fidgety/restless 1 2 0 0 0  Suicidal thoughts 0 0 0 0 0  PHQ-9 Score 8 12 9 8 16   Difficult doing work/chores Somewhat difficult Somewhat difficult Somewhat difficult - Not difficult at all  Some recent data might be hidden       Assessment & Plan:   1. Annual physical exam As documented. Discussed monthly self breast exams and yearly mammograms; at least 30 minutes of aerobic activity at least 5 days/week and weight-bearing exercise 2x/week; proper sunscreen use reviewed; healthy diet, including goals of calcium and vitamin D intake and alcohol recommendations (less than or equal to 1 drink/day) reviewed; regular seatbelt use; changing batteries in smoke detectors.  Immunization recommendations discussed.  Colonoscopy recommendations reviewed.  2. CKD (chronic kidney disease) stage 3, GFR 30-59 ml/min (HCC) Unchanged, needs to avoid NSAIDS and keep BP and diabetes well controlled  3. 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 Pittsley is reminded of the importance of commitment to daily physical activity for 30 minutes or more, as able and the need to limit carbohydrate intake to 30 to 60 grams per meal to help with blood sugar control.   The need to take medication as prescribed, test blood sugar as directed, and to call between visits if there is a concern that blood sugar is uncontrolled is also discussed.   Ms. Ovando is reminded of the importance of daily foot exam, annual eye examination, and good blood sugar, blood pressure and cholesterol control.  - Microalbumin / creatinine  urine ratio  4. Obesity (BMI 30.0-34.9) Patient re-educated about  the importance of commitment to a  minimum of 150 minutes of exercise per week as able. As well as eating a well-balanced diet.  Reports that she does not eat consistency and is unsure why she continues to have weight.  5. Chronic left shoulder pain Follow up with Dr Aline Brochure for possible injection Appreciate collaboration in her care.  6. Generalized anxiety disorder More controlled, continue medication.  7. 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 Please let PCP know if need anything from [place]. Appreciate collaboration in her care.  8. Hypertension goal BP (blood pressure) < 130/80 Controlled, no change in medication DASH diet and commitment to daily physical activity for a minimum of 30 minutes discussed and encouraged, as a part of hypertension management. The importance of attaining a healthy weight is also discussed.   Medicare Attestation I have personally reviewed: The patient's medical and social history Their use of alcohol, tobacco or illicit drugs Their current medications and  supplements The patient's functional ability including ADLs,fall risks, home safety risks, cognitive, and hearing and visual impairment Diet and physical activities Evidence for depression or mood disorders  The patient's weight, height, BMI, and visual acuity have been recorded in the chart.  I have made referrals, counseling, and provided education to the patient based on review of the above and I have provided the patient with a written personalized care plan for preventive services.    Follow Up 6 months  Perlie Mayo, NP   12/24/2018

## 2018-12-24 NOTE — Patient Instructions (Signed)
    Thank you for coming into the office today. I appreciate the opportunity to provide you with the care for your health and wellness. Today we discussed: overall health  Follow Up: 6 months  No labs today  HEALTH MAINTENANCE RECOMMENDATIONS:  It is recommended that you get at least 30 minutes of aerobic exercise at least 5 days/week (for weight loss, you may need as much as 60-90 minutes). This can be any activity that gets your heart rate up. This can be divided in 10-15 minute intervals if needed, but try and build up your endurance at least once a week.  Weight bearing exercise is also recommended twice weekly.  Eat a healthy diet with lots of vegetables, fruits and fiber.  "Colorful" foods have a lot of vitamins (ie green vegetables, tomatoes, red peppers, etc).  Limit sweet tea, regular sodas and alcoholic beverages, all of which has a lot of calories and sugar.  Up to 1 alcoholic drink daily may be beneficial for women (unless trying to lose weight, watch sugars).  Drink a lot of water.  Calcium recommendations are 1200-1500 mg daily (1500 mg for postmenopausal women or women without ovaries), and vitamin D 1000 IU daily.  This should be obtained from diet and/or supplements (vitamins), and calcium should not be taken all at once, but in divided doses.  Monthly self breast exams and yearly mammograms for women over the age of 7 is recommended.  Sunscreen of at least SPF 30 should be used on all sun-exposed parts of the skin when outside between the hours of 10 am and 4 pm (not just when at beach or pool, but even with exercise, golf, tennis, and yard work!)  Use a sunscreen that says "broad spectrum" so it covers both UVA and UVB rays, and make sure to reapply every 1-2 hours.  Remember to change the batteries in your smoke detectors when changing your clock times in the spring and fall.  Use your seat belt every time you are in a car, and please drive safely and not be distracted with  cell phones and texting while driving.  Please continue to practice social distancing to keep you, your family, and our community safe.  If you must go out, please wear a Mask and practice good handwashing.  [place] YOUR HANDS WELL AND FREQUENTLY. AVOID TOUCHING YOUR FACE, UNLESS YOUR HANDS ARE FRESHLY WASHED.  GET FRESH AIR DAILY. STAY HYDRATED WITH WATER.   It was a pleasure to see you and I look forward to continuing to work together on your health and well-being. Please do not hesitate to call the office if you need care or have questions about your care.  Have a wonderful day and week.  With Gratitude,  Cherly Beach, DNP, AGNP-BC

## 2018-12-25 LAB — NOVEL CORONAVIRUS, NAA: SARS-CoV-2, NAA: NOT DETECTED

## 2018-12-31 ENCOUNTER — Telehealth: Payer: Self-pay | Admitting: Radiology

## 2018-12-31 ENCOUNTER — Other Ambulatory Visit: Payer: Self-pay

## 2018-12-31 ENCOUNTER — Encounter: Payer: Self-pay | Admitting: Orthopedic Surgery

## 2018-12-31 ENCOUNTER — Ambulatory Visit (INDEPENDENT_AMBULATORY_CARE_PROVIDER_SITE_OTHER): Payer: Medicare Other

## 2018-12-31 ENCOUNTER — Ambulatory Visit (INDEPENDENT_AMBULATORY_CARE_PROVIDER_SITE_OTHER): Payer: Medicare Other | Admitting: Orthopedic Surgery

## 2018-12-31 VITALS — BP 119/77 | HR 111 | Ht 66.0 in | Wt 200.0 lb

## 2018-12-31 DIAGNOSIS — M542 Cervicalgia: Secondary | ICD-10-CM

## 2018-12-31 MED ORDER — TIZANIDINE HCL 4 MG PO TABS: 4.0000 mg | ORAL_TABLET | Freq: Four times a day (QID) | ORAL | 0 refills | Status: DC | PRN

## 2018-12-31 NOTE — Patient Instructions (Signed)
Apply moist heat left shoulder 3 times a day for 20 minutes  Take muscle relaxer 3 times a day  You are being scheduled for MRI of the cervical spine.

## 2018-12-31 NOTE — Progress Notes (Signed)
Cheryl Abbott  12/31/2018  HISTORY SECTION :  Chief Complaint  Patient presents with  . Shoulder Pain    both, left greater than right/ worse at night wants to know if you are sure it is bursitis    73 year old female with bilateral bursitis in the shoulders presents with severe pain in her left shoulder and neck.  She did not improve with the initial treatment of injection activity modification muscle relaxer tramadol Tylenol  She has a history of multiple myeloma followed at the cancer center   Review of Systems  Constitutional: Positive for malaise/fatigue.  Musculoskeletal: Positive for back pain and neck pain.  Skin:       Complains of knots shoulder and arm  Neurological: Negative for tingling.     Past Medical History:  Diagnosis Date  . Anemia   . Anxiety   . Anxiety disorder   . Arthritis   . Cancer ([place])    acute myeloma  . 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 Chronic back pain   . Depression   . Fatty liver   . GERD (gastroesophageal reflux disease)   . Headache   . History of adenomatous polyp of colon    2008  . History of kidney stones   . Hypercalcemia   . Hyperlipidemia   . Hypertension   . Left ureteral stone   . Lipoma of arm 01/19/2015  . Lumbar disc disease with radiculopathy   . Nephrolithiasis   . Neuropathy, peripheral   . OSA treated with BiPAP    per study 2007  . Sciatica   . Shoulder pain, bilateral   . Sigmoid diverticulosis   . Type 2 diabetes mellitus ([place])     Past Surgical History:  Procedure Laterality Date  . BIOPSY N/A 03/17/2013   Procedure: GASTRIC BIOPSIES;  Surgeon: Danie Binder, MD;  Location: [facility];  Service: Endoscopy;  Laterality: N/A;  . BIOPSY  06/10/2015   Procedure: BIOPSY;  Surgeon: Danie Binder, MD;  [facility];  Service: Endoscopy;;  gastric biopsy  . CARDIAC CATHETERIZATION  05-11-2003  dr Shelva Majestic ([facility])   Abnormal cardiolite/   Normal coronary arteries  and normal LVF,  ef  63%  . CARDIOVASCULAR STRESS TEST  01-01-2014   normal lexiscan cardiolite/  no ischemia/ infarct/  normal LV function and wall motion ,  ef 81%  . COLONOSCOPY  last one 10/02/2011   [facility], IH, NEXT TCS APR 2018  . COLONOSCOPY WITH PROPOFOL N/A 02/26/2017   Procedure: COLONOSCOPY WITH PROPOFOL;  Surgeon: Danie Binder, MD;  [facility];  Service: Endoscopy;  Laterality: N/A;  11:00am  . CYSTO/   RIGHT URETEROSOCOPY LASER LITHOTRIPSY STONE EXTRACTION  10-13-2004  . CYSTO/  RIGHT RETROGRADE PYELOGRAM/  PLACMENT RIGHT URETERAL STENT  01-10-2010  . CYSTOSCOPY W/ URETERAL STENT PLACEMENT Right 08/19/2015   Procedure: CYSTOSCOPY WITH RETROGRADE PYELOGRAM/URETERAL STENT PLACEMENT;  Surgeon: Franchot Gallo, MD;  Location: [facility];  Service: Urology;  Laterality: Right;  . CYSTOSCOPY WITH RETROGRADE PYELOGRAM, URETEROSCOPY AND STENT PLACEMENT Left 10/21/2014   Procedure: CYSTOSCOPY WITH RETROGRADE PYELOGRAM, URETEROSCOPY AND STENT PLACEMENT;  Surgeon: Franchot Gallo, MD;  Location: [facility];  Service: Urology;  Laterality: Left;  . CYSTOSCOPY WITH RETROGRADE PYELOGRAM, URETEROSCOPY AND STENT PLACEMENT Right 11/22/2015   Procedure: CYSTOSCOPY, RIGHT URETERAL STENT REMOVAL; RIGHT RETROGRADE PYELOGRAM, RIGHT URETEROSCOPY WITH BALLOON DILATION; RIGHT URETERAL STENT PLACEMENT;  Surgeon: Franchot Gallo, MD;  Location:  [facility];  Service: Urology;  Laterality: Right;  . CYSTOSCOPY/RETROGRADE/URETEROSCOPY/STONE EXTRACTION WITH BASKET Bilateral 08/02/2015   Procedure: CYSTOSCOPY; BILATERAL RETROGRADE PYELOGRAMS; BILATERAL URETEROSCOPY, STONE EXTRACTION WITH BASKET;  Surgeon: Franchot Gallo, MD;  Location: [facility];  Service: Urology;  Laterality: Bilateral;  . CYSTOSCOPY/RETROGRADE/URETEROSCOPY/STONE EXTRACTION WITH BASKET Right 06/28/2015   Procedure: CYSTOSCOPY/RETROGRADE/URETEROSCOPY/STONE EXTRACTION WITH BASKET, RIGHT URETERAL DOUBLE J STENT PLACEMENT;   Surgeon: Franchot Gallo, MD;  Location: [facility];  Service: Urology;  Laterality: Right;  . ESOPHAGOGASTRODUODENOSCOPY (EGD) WITH PROPOFOL N/A 03/17/2013   Procedure: ESOPHAGOGASTRODUODENOSCOPY (EGD) WITH PROPOFOL;  Surgeon: Danie Binder, MD;  Location: [facility];  Service: Endoscopy;  Laterality: N/A;  . ESOPHAGOGASTRODUODENOSCOPY (EGD) WITH PROPOFOL N/A 06/10/2015   Distal gastritis, distal esophageal stricture s/p dilation  . ESOPHAGOGASTRODUODENOSCOPY (EGD) WITH PROPOFOL N/A 02/26/2017   Procedure: ESOPHAGOGASTRODUODENOSCOPY (EGD) WITH PROPOFOL;  Surgeon: Danie Binder, MD;  [facility];  Service: Endoscopy;  Laterality: N/A;  . FLEXIBLE SIGMOIDOSCOPY  09/11/2011   [id number] Internal hemorrhoids  . HOLMIUM LASER APPLICATION Left 8/58/8502   Procedure: HOLMIUM LASER APPLICATION;  Surgeon: Franchot Gallo, MD;  Location: [facility];  Service: Urology;  Laterality: Left;  . HOLMIUM LASER APPLICATION Right 7/74/1287   Procedure: HOLMIUM LASER APPLICATION;  Surgeon: Franchot Gallo, MD;  Location: [facility];  Service: Urology;  Laterality: Right;  . MASS EXCISION Left 03/04/2015   Procedure: EXCISION OF SOFT TISSUE NEOPLASM LEFT ARM;  Surgeon: Aviva Signs, MD;  Location: [facility];  Service: General;  Laterality: Left;  . PERCUTANEOUS NEPHROSTOLITHOTOMY Bilateral 12/  2011     [facility]  . POLYPECTOMY N/A 03/17/2013   Procedure: GASTRIC POLYPECTOMY;  Surgeon: Danie Binder, MD;  Location: [facility];  Service: Endoscopy;  Laterality: N/A;  . POLYPECTOMY  02/26/2017   Procedure: POLYPECTOMY;  Surgeon: Danie Binder, MD;  [facility];  Service: Endoscopy;;  polyp at cecum, ascending colon polyps x3, hepatic flexure polyps x8, transverse colon polyps x8   . REMOVAL RIGHT THIGH CYST  2006  . SAVORY DILATION N/A 03/17/2013   Procedure: SAVORY DILATION;  Surgeon: Danie Binder, MD;  Location: [facility];  Service: Endoscopy;  Laterality: N/A;  #12.8, 14, 15, 16 dilators  used  . SAVORY DILATION N/A 06/10/2015   Procedure: SAVORY DILATION;  Surgeon: Danie Binder, MD;  [facility];  Service: Endoscopy;  Laterality: N/A;  . TRANSTHORACIC ECHOCARDIOGRAM  01-01-2014   mild LVH/  ef 86-76%/  grade I diastolic dysfunction/  trivial MR, TR, and PR  . VAGINAL HYSTERECTOMY  1970's     Allergies  Allergen Reactions  . Ace Inhibitors Cough  . Keflex [Cephalexin] Diarrhea and Nausea And Vomiting  . Nitrofurantoin Diarrhea and Nausea And Vomiting  . Penicillins Hives and Other (See Comments)    Reaction:  Blisters on hands/feet  Has patient had a PCN reaction causing immediate rash, facial/tongue/throat swelling, SOB or lightheadedness with hypotension: No Has patient had a PCN reaction causing severe rash involving mucus membranes or skin necrosis: No Has patient had a PCN reaction that required hospitalization No Has patient had a PCN reaction occurring within the last 10 years: No If all of the above answers are "NO", then may proceed with Cephalosporin use.     Current Outpatient Medications:  .  ALPRAZolam (XANAX) 0.5 MG tablet, Take one tablet at bedtime for sleep and anxiety, by mouth, Disp: 30 tablet, Rfl: 5 .  aspirin EC 325 MG tablet, Take 325 mg by  mouth daily., Disp: , Rfl:  .  calcium-vitamin D (OSCAL WITH D) 500-200 MG-UNIT tablet, Take 1 tablet by mouth daily., Disp: , Rfl:  .  diphenhydramine-acetaminophen (TYLENOL PM) 25-500 MG TABS tablet, Take 1 tablet by mouth 2 (two) times daily., Disp: , Rfl:  .  furosemide (LASIX) 40 MG tablet, TAKE 1 TABLET DAILY AS NEEDED FOR SWELLING, Disp: 90 tablet, Rfl: 1 .  iron polysaccharides (NIFEREX) 150 MG capsule, Take 150 mg by mouth daily., Disp: , Rfl:  .  lovastatin (MEVACOR) 40 MG tablet, TAKE 1 TABLET BY MOUTH EVERYDAY AT BEDTIME (Patient taking differently: 40 mg. ), Disp: 90 tablet, Rfl: 1 .  Melatonin 10 MG TABS, Take 10 mg by mouth at bedtime., Disp: , Rfl:  .  OXYGEN, Inhale into the  lungs daily. Bedtime, Disp: , Rfl:  .  pantoprazole (PROTONIX) 40 MG tablet, TAKE 1 TABLET (40 MG TOTAL) BY MOUTH 2 (TWO) TIMES DAILY., Disp: 180 tablet, Rfl: 1 .  PARoxetine (PAXIL) 40 MG tablet, TAKE 1 TABLET BY MOUTH EVERY DAY IN THE MORNING, Disp: 90 tablet, Rfl: 0 .  potassium chloride (K-DUR) 10 MEQ tablet, Take 1 tablet (10 mEq total) by mouth daily., Disp: 3 tablet, Rfl: 0 .  SYMBICORT 80-4.5 MCG/ACT inhaler, INHALE 2 PUFFS INTO THE LUNGS TWICE A DAY, Disp: 30.6 Inhaler, Rfl: 1 .  TRADJENTA 5 MG TABS tablet, TAKE 1 TABLET BY MOUTH EVERY DAY, Disp: 90 tablet, Rfl: 1 .  traMADol (ULTRAM) 50 MG tablet, Take 1 tablet (50 mg total) by mouth every 6 (six) hours as needed., Disp: 20 tablet, Rfl: 0 .  vitamin B-12 (CYANOCOBALAMIN) 100 MCG tablet, Take 100 mcg by mouth daily., Disp: , Rfl:  .  amLODipine (NORVASC) 10 MG tablet, TAKE 1 TABLET BY MOUTH EVERY DAY (Patient taking differently: Take 10 mg by mouth daily. ), Disp: 90 tablet, Rfl: 3 .  tiZANidine (ZANAFLEX) 4 MG tablet, Take 1 tablet (4 mg total) by mouth every 6 (six) hours as needed for muscle spasms., Disp: 30 tablet, Rfl: 0   PHYSICAL EXAM SECTION: 1) BP 119/77   Pulse (!) 111   Ht 5' 6"  (1.676 m)   Wt 200 lb (90.7 kg)   BMI 32.28 kg/m   Body mass index is 32.28 kg/m. General appearance: Well-developed well-nourished no gross deformities  2) Cardiovascular normal pulse and perfusion in all 4 extremities normal color without edema  3) Neurologically deep tendon reflexes are equal and normal, no sensation loss or deficits no pathologic reflexes  4) Psychological: Awake alert and oriented x3 mood and affect normal  5) Skin no lacerations or ulcerations no nodularity no palpable masses, no erythema or nodularity  6) Musculoskeletal:   Left and right shoulder seem to have normal range of motion they are nontender strength is normal to resistance testing the skin is intact she has no rash lymph nodes are negative  Her left  paracervical musculature trapezius muscle have increased muscle tension tender to palpation decreased range of motion in the cervical spine tenderness along the midline of the cervical spine    MEDICAL DECISION SECTION:  Encounter Diagnoses  Name Primary?  . Neck pain Yes  . Cervicalgia     Imaging Cervical spine x-ray shows mid cervical spondylosis Plan:  (Rx., Inj., surg., Frx, MRI/CT, XR:2)  I placed her on tizanidine  She has a history of cancer recommend MRI cervical spine  Meds ordered this encounter  Medications  . tiZANidine (ZANAFLEX) 4 MG tablet  Sig: Take 1 tablet (4 mg total) by mouth every 6 (six) hours as needed for muscle spasms.    Dispense:  30 tablet    Refill:  0     12:01 PM

## 2018-12-31 NOTE — Telephone Encounter (Signed)
Left message for patient to advise the MRI scan is sch for July 29th at 3pm arrive at 2:30, wear mask. Asked her to call me back let me know if she is okay with this

## 2019-01-02 ENCOUNTER — Other Ambulatory Visit: Payer: Self-pay

## 2019-01-02 ENCOUNTER — Emergency Department (HOSPITAL_COMMUNITY): Payer: Medicare Other

## 2019-01-02 ENCOUNTER — Inpatient Hospital Stay (HOSPITAL_COMMUNITY)
Admission: EM | Admit: 2019-01-02 | Discharge: 2019-01-05 | DRG: 872 | Disposition: A | Discharge status: Home Health | Payer: Medicare Other | Attending: Family Medicine | Admitting: Family Medicine

## 2019-01-02 ENCOUNTER — Encounter (HOSPITAL_COMMUNITY): Payer: Self-pay | Admitting: Emergency Medicine

## 2019-01-02 DIAGNOSIS — E785 Hyperlipidemia, unspecified: Secondary | ICD-10-CM | POA: Diagnosis present

## 2019-01-02 DIAGNOSIS — E1122 Type 2 diabetes mellitus with diabetic chronic kidney disease: Secondary | ICD-10-CM | POA: Diagnosis present

## 2019-01-02 DIAGNOSIS — Z20828 Contact with and (suspected) exposure to other viral communicable diseases: Secondary | ICD-10-CM | POA: Diagnosis present

## 2019-01-02 DIAGNOSIS — Z8673 Personal history of transient ischemic attack (TIA), and cerebral infarction without residual deficits: Secondary | ICD-10-CM | POA: Diagnosis not present

## 2019-01-02 DIAGNOSIS — Z833 Family history of diabetes mellitus: Secondary | ICD-10-CM

## 2019-01-02 DIAGNOSIS — Z7951 Long term (current) use of inhaled steroids: Secondary | ICD-10-CM

## 2019-01-02 DIAGNOSIS — F329 Major depressive disorder, single episode, unspecified: Secondary | ICD-10-CM | POA: Diagnosis present

## 2019-01-02 DIAGNOSIS — J9811 Atelectasis: Secondary | ICD-10-CM | POA: Diagnosis not present

## 2019-01-02 DIAGNOSIS — N183 Chronic kidney disease, stage 3 unspecified: Secondary | ICD-10-CM | POA: Diagnosis present

## 2019-01-02 DIAGNOSIS — K219 Gastro-esophageal reflux disease without esophagitis: Secondary | ICD-10-CM | POA: Diagnosis present

## 2019-01-02 DIAGNOSIS — R350 Frequency of micturition: Secondary | ICD-10-CM | POA: Diagnosis not present

## 2019-01-02 DIAGNOSIS — R0682 Tachypnea, not elsewhere classified: Secondary | ICD-10-CM | POA: Diagnosis present

## 2019-01-02 DIAGNOSIS — I959 Hypotension, unspecified: Secondary | ICD-10-CM | POA: Diagnosis not present

## 2019-01-02 DIAGNOSIS — Z79899 Other long term (current) drug therapy: Secondary | ICD-10-CM

## 2019-01-02 DIAGNOSIS — F419 Anxiety disorder, unspecified: Secondary | ICD-10-CM | POA: Diagnosis present

## 2019-01-02 DIAGNOSIS — R000 Tachycardia, unspecified: Secondary | ICD-10-CM | POA: Diagnosis present

## 2019-01-02 DIAGNOSIS — R531 Weakness: Secondary | ICD-10-CM

## 2019-01-02 DIAGNOSIS — C9000 Multiple myeloma not having achieved remission: Secondary | ICD-10-CM | POA: Diagnosis present

## 2019-01-02 DIAGNOSIS — R319 Hematuria, unspecified: Secondary | ICD-10-CM | POA: Diagnosis present

## 2019-01-02 DIAGNOSIS — N3000 Acute cystitis without hematuria: Secondary | ICD-10-CM | POA: Diagnosis not present

## 2019-01-02 DIAGNOSIS — Z825 Family history of asthma and other chronic lower respiratory diseases: Secondary | ICD-10-CM

## 2019-01-02 DIAGNOSIS — A419 Sepsis, unspecified organism: Principal | ICD-10-CM | POA: Diagnosis present

## 2019-01-02 DIAGNOSIS — I10 Essential (primary) hypertension: Secondary | ICD-10-CM | POA: Diagnosis not present

## 2019-01-02 DIAGNOSIS — N3001 Acute cystitis with hematuria: Secondary | ICD-10-CM | POA: Diagnosis not present

## 2019-01-02 DIAGNOSIS — N390 Urinary tract infection, site not specified: Secondary | ICD-10-CM | POA: Diagnosis not present

## 2019-01-02 DIAGNOSIS — Z880 Allergy status to penicillin: Secondary | ICD-10-CM | POA: Diagnosis not present

## 2019-01-02 DIAGNOSIS — Z888 Allergy status to other drugs, medicaments and biological substances status: Secondary | ICD-10-CM

## 2019-01-02 DIAGNOSIS — I129 Hypertensive chronic kidney disease with stage 1 through stage 4 chronic kidney disease, or unspecified chronic kidney disease: Secondary | ICD-10-CM | POA: Diagnosis present

## 2019-01-02 DIAGNOSIS — E1121 Type 2 diabetes mellitus with diabetic nephropathy: Secondary | ICD-10-CM | POA: Diagnosis present

## 2019-01-02 DIAGNOSIS — E1142 Type 2 diabetes mellitus with diabetic polyneuropathy: Secondary | ICD-10-CM | POA: Diagnosis not present

## 2019-01-02 DIAGNOSIS — M5116 Intervertebral disc disorders with radiculopathy, lumbar region: Secondary | ICD-10-CM | POA: Diagnosis not present

## 2019-01-02 DIAGNOSIS — Z8249 Family history of ischemic heart disease and other diseases of the circulatory system: Secondary | ICD-10-CM

## 2019-01-02 DIAGNOSIS — G8929 Other chronic pain: Secondary | ICD-10-CM | POA: Diagnosis present

## 2019-01-02 DIAGNOSIS — N179 Acute kidney failure, unspecified: Secondary | ICD-10-CM | POA: Diagnosis present

## 2019-01-02 DIAGNOSIS — Z7982 Long term (current) use of aspirin: Secondary | ICD-10-CM

## 2019-01-02 LAB — CBC WITH DIFFERENTIAL/PLATELET
Abs Immature Granulocytes: 0.02 10*3/uL (ref 0.00–0.07)
Basophils Absolute: 0 10*3/uL (ref 0.0–0.1)
Basophils Relative: 1 %
Eosinophils Absolute: 0.3 10*3/uL (ref 0.0–0.5)
Eosinophils Relative: 4 %
HCT: 38.7 % (ref 36.0–46.0)
Hemoglobin: 12.4 g/dL (ref 12.0–15.0)
Immature Granulocytes: 0 %
Lymphocytes Relative: 26 %
Lymphs Abs: 1.6 10*3/uL (ref 0.7–4.0)
MCH: 31.6 pg (ref 26.0–34.0)
MCHC: 32 g/dL (ref 30.0–36.0)
MCV: 98.7 fL (ref 80.0–100.0)
Monocytes Absolute: 0.6 10*3/uL (ref 0.1–1.0)
Monocytes Relative: 9 %
Neutro Abs: 3.6 10*3/uL (ref 1.7–7.7)
Neutrophils Relative %: 60 %
Platelets: 233 10*3/uL (ref 150–400)
RBC: 3.92 MIL/uL (ref 3.87–5.11)
RDW: 15.1 % (ref 11.5–15.5)
WBC: 6 10*3/uL (ref 4.0–10.5)
nRBC: 0 % (ref 0.0–0.2)

## 2019-01-02 LAB — URINALYSIS, ROUTINE W REFLEX MICROSCOPIC
Bilirubin Urine: NEGATIVE
Glucose, UA: NEGATIVE mg/dL
Ketones, ur: NEGATIVE mg/dL
Nitrite: NEGATIVE
Protein, ur: 30 mg/dL — AB
RBC / HPF: >50 RBC/hpf — ABNORMAL HIGH (ref 0–5)
Specific Gravity, Urine: 1.018 (ref 1.005–1.030)
WBC, UA: >50 WBC/hpf — ABNORMAL HIGH (ref 0–5)
pH: 5 (ref 5.0–8.0)

## 2019-01-02 LAB — BASIC METABOLIC PANEL
Anion gap: 8 (ref 5–15)
BUN: 19 mg/dL (ref 8–23)
CO2: 23 mmol/L (ref 22–32)
Calcium: 9.2 mg/dL (ref 8.9–10.3)
Chloride: 102 mmol/L (ref 98–111)
Creatinine, Ser: 2.22 mg/dL — ABNORMAL HIGH (ref 0.44–1.00)
GFR calc Af Amer: 25 mL/min — ABNORMAL LOW (ref >60)
GFR calc non Af Amer: 21 mL/min — ABNORMAL LOW (ref >60)
Glucose, Bld: 160 mg/dL — ABNORMAL HIGH (ref 70–99)
Potassium: 3.8 mmol/L (ref 3.5–5.1)
Sodium: 133 mmol/L — ABNORMAL LOW (ref 135–145)

## 2019-01-02 LAB — SARS CORONAVIRUS 2 BY RT PCR (HOSPITAL ORDER, PERFORMED IN ~~LOC~~ HOSPITAL LAB): SARS Coronavirus 2: NEGATIVE

## 2019-01-02 LAB — TROPONIN I (HIGH SENSITIVITY)
Troponin I (High Sensitivity): 4 ng/L (ref <18)
Troponin I (High Sensitivity): 4 ng/L (ref <18)

## 2019-01-02 LAB — LACTIC ACID, PLASMA
Lactic Acid, Venous: 1.4 mmol/L (ref 0.5–1.9)
Lactic Acid, Venous: 1.3 mmol/L (ref 0.5–1.9)

## 2019-01-02 LAB — GLUCOSE, CAPILLARY: Glucose-Capillary: 106 mg/dL — ABNORMAL HIGH (ref 70–99)

## 2019-01-02 MED ORDER — POTASSIUM CHLORIDE IN NACL 20-0.9 MEQ/L-% IV SOLN
INTRAVENOUS | Status: AC
  Administered 2019-01-02 – 2019-01-03 (×2): via INTRAVENOUS

## 2019-01-02 MED ORDER — CIPROFLOXACIN IN D5W 400 MG/200ML IV SOLN
400.0000 mg | Freq: Once | INTRAVENOUS | Status: AC
  Administered 2019-01-02: 400 mg via INTRAVENOUS
  Filled 2019-01-02: qty 200

## 2019-01-02 MED ORDER — MOMETASONE FURO-FORMOTEROL FUM 100-5 MCG/ACT IN AERO
2.0000 | INHALATION_SPRAY | Freq: Two times a day (BID) | RESPIRATORY_TRACT | Status: DC
  Administered 2019-01-03 – 2019-01-05 (×5): 2 via RESPIRATORY_TRACT
  Filled 2019-01-02: qty 8.8

## 2019-01-02 MED ORDER — ONDANSETRON HCL 4 MG PO TABS: 4.0000 mg | ORAL_TABLET | Freq: Four times a day (QID) | ORAL | Status: DC | PRN

## 2019-01-02 MED ORDER — ACETAMINOPHEN 325 MG PO TABS
650.0000 mg | ORAL_TABLET | Freq: Four times a day (QID) | ORAL | Status: DC | PRN
  Administered 2019-01-03 – 2019-01-05 (×3): 650 mg via ORAL
  Filled 2019-01-02 (×3): qty 2

## 2019-01-02 MED ORDER — ACETAMINOPHEN 650 MG RE SUPP: 650.0000 mg | Freq: Four times a day (QID) | RECTAL | Status: DC | PRN

## 2019-01-02 MED ORDER — SODIUM CHLORIDE 0.9 % IV SOLN
1.0000 g | INTRAVENOUS | Status: DC
  Administered 2019-01-02 – 2019-01-04 (×3): 1 g via INTRAVENOUS
  Filled 2019-01-02 (×3): qty 10

## 2019-01-02 MED ORDER — POLYETHYLENE GLYCOL 3350 17 G PO PACK: 17.0000 g | PACK | Freq: Every day | ORAL | Status: DC | PRN

## 2019-01-02 MED ORDER — ENOXAPARIN SODIUM 30 MG/0.3ML ~~LOC~~ SOLN
30.0000 mg | SUBCUTANEOUS | Status: DC
  Administered 2019-01-03: 30 mg via SUBCUTANEOUS
  Filled 2019-01-02: qty 0.3

## 2019-01-02 MED ORDER — TRAZODONE HCL 50 MG PO TABS
100.0000 mg | ORAL_TABLET | Freq: Every day | ORAL | Status: DC
  Administered 2019-01-02 – 2019-01-04 (×3): 100 mg via ORAL
  Filled 2019-01-02 (×3): qty 2

## 2019-01-02 MED ORDER — PANTOPRAZOLE SODIUM 40 MG PO TBEC
40.0000 mg | DELAYED_RELEASE_TABLET | Freq: Two times a day (BID) | ORAL | Status: DC
  Administered 2019-01-02 – 2019-01-05 (×6): 40 mg via ORAL
  Filled 2019-01-02 (×6): qty 1

## 2019-01-02 MED ORDER — ONDANSETRON HCL 4 MG/2ML IJ SOLN: 4.0000 mg | Freq: Four times a day (QID) | INTRAMUSCULAR | Status: DC | PRN

## 2019-01-02 MED ORDER — LINAGLIPTIN 5 MG PO TABS
5.0000 mg | ORAL_TABLET | Freq: Every day | ORAL | Status: DC
  Administered 2019-01-03 – 2019-01-05 (×3): 5 mg via ORAL
  Filled 2019-01-02 (×3): qty 1

## 2019-01-02 MED ORDER — SODIUM CHLORIDE 0.9 % IV BOLUS
1000.0000 mL | Freq: Once | INTRAVENOUS | Status: AC
  Administered 2019-01-02: 1000 mL via INTRAVENOUS

## 2019-01-02 MED ORDER — ASPIRIN EC 325 MG PO TBEC
325.0000 mg | DELAYED_RELEASE_TABLET | Freq: Every day | ORAL | Status: DC
  Administered 2019-01-03 – 2019-01-05 (×3): 325 mg via ORAL
  Filled 2019-01-02 (×3): qty 1

## 2019-01-02 MED ORDER — INSULIN ASPART 100 UNIT/ML ~~LOC~~ SOLN: 0.0000 [IU] | Freq: Three times a day (TID) | SUBCUTANEOUS | Status: DC

## 2019-01-02 MED ORDER — PAROXETINE HCL 20 MG PO TABS
40.0000 mg | ORAL_TABLET | Freq: Every morning | ORAL | Status: DC
  Administered 2019-01-03 – 2019-01-05 (×3): 40 mg via ORAL
  Filled 2019-01-02 (×3): qty 2

## 2019-01-02 MED ORDER — HYDROCODONE-ACETAMINOPHEN 5-325 MG PO TABS
1.0000 | ORAL_TABLET | Freq: Three times a day (TID) | ORAL | Status: AC | PRN
  Administered 2019-01-02 – 2019-01-03 (×2): 1 via ORAL
  Filled 2019-01-02 (×2): qty 1

## 2019-01-02 MED ORDER — TIZANIDINE HCL 4 MG PO TABS
4.0000 mg | ORAL_TABLET | Freq: Three times a day (TID) | ORAL | Status: DC | PRN
  Administered 2019-01-02 – 2019-01-03 (×2): 4 mg via ORAL
  Filled 2019-01-02 (×2): qty 1

## 2019-01-02 MED ORDER — ALPRAZOLAM 0.5 MG PO TABS
0.5000 mg | ORAL_TABLET | Freq: Every day | ORAL | Status: DC
  Administered 2019-01-02 – 2019-01-04 (×3): 0.5 mg via ORAL
  Filled 2019-01-02 (×3): qty 1

## 2019-01-02 MED ORDER — PRAVASTATIN SODIUM 40 MG PO TABS
40.0000 mg | ORAL_TABLET | Freq: Every day | ORAL | Status: DC
  Administered 2019-01-02 – 2019-01-04 (×3): 40 mg via ORAL
  Filled 2019-01-02 (×3): qty 1

## 2019-01-02 NOTE — ED Notes (Signed)
Pt reports her cough started 2 weeks ago and is productive.

## 2019-01-02 NOTE — ED Notes (Signed)
Second set of orthostatics attempted BP laying is 77/34 and 77/39. Did not attempt to stand patient. PA notified. RN made aware. PA states no more orthostatics needed at this time.

## 2019-01-02 NOTE — ED Triage Notes (Addendum)
Pt c/o generalized weakness "for a while now." Pt states she has myeloma, but has not began chemo yet. Denies fever. Pt noted to have a dry cough.

## 2019-01-02 NOTE — ED Provider Notes (Signed)
[facility] EMERGENCY DEPARTMENT Provider Note   CSN: [id number] Arrival date & time: 01/02/19  1417     History   Chief Complaint Chief Complaint  Patient presents with  . Weakness    HPI Cheryl Abbott is a 73 y.o. female.     HPI   Cheryl Abbott is a 73 y.o. female who presents to the Emergency Department complaining of generalized weakness, cough.  Symptoms have been present for 2 days.  She also states that she has multiple myeloma, but has not started chemo treatments as of yet.  She describes her cough as mostly nonproductive.  She also states that she has been having neck pain and left shoulder pain.  She was seen by orthopedics earlier this week and given Zanaflex for muscle spasms.  She is concerned that this medication may be contributing to her weakness.  She denies fever, chills, chest pain or abdominal pain, shortness of breath, vomiting and dysuria.  She attempted to see her PCP today but states that when she got to the office the office was closed, which prompted her to come to the emergency department for further evaluation.  Past Medical History:  Diagnosis Date  . Anemia   . Anxiety   . Anxiety disorder   . Arthritis   . Cancer ([place])    acute myeloma  . 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 Chronic back pain   . Depression   . Fatty liver   . GERD (gastroesophageal reflux disease)   . Headache   . History of adenomatous polyp of colon    2008  . History of kidney stones   . Hypercalcemia   . Hyperlipidemia   . Hypertension   . Left ureteral stone   . Lipoma of arm 01/19/2015  . Lumbar disc disease with radiculopathy   . Nephrolithiasis   . Neuropathy, peripheral   . OSA treated with BiPAP    per study 2007  . Sciatica   . Shoulder pain, bilateral   . Sigmoid diverticulosis   . Type 2 diabetes mellitus [facility])     Patient Active Problem List   Diagnosis Date Noted  . 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 Shoulder pain, left  02/28/2018  . Palpable mass of neck 02/28/2018  . Exertional dyspnea 02/28/2018  . Insomnia 11/09/2017  . Multiple myeloma ([place]) 03/15/2017  . Urinary frequency 02/28/2017  . 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 Vaginal dryness, menopausal 10/14/2016  . Neuropathic pain of both feet 04/11/2016  . TIA (transient ischemic attack) 02/26/2016  . Lactose intolerance in adult 02/01/2016  . Brain TIA 08/19/2015  . CKD (chronic kidney disease) stage 3, GFR 30-59 ml/min (HCC) 07/12/2015  . Lipoma of arm 01/19/2015  . Anemia 10/14/2014  . Lumbar back pain with radiculopathy affecting left lower extremity 05/11/2014  . Nocturnal hypoxia 02/04/2014  . Obesity (BMI 30.0-34.9) 01/02/2014  . Generalized anxiety disorder 05/20/2013  . Anxiety and depression 08/13/2012  . Abnormal brain scan 02/07/2012  . Thoracic or lumbosacral neuritis or radiculitis, unspecified 12/22/2010  . Hypercalcemia 11/30/2010  . Colon adenomas 05/11/2009  . HEMORRHOIDS 03/15/2009  . GERD 03/15/2009  . FATTY LIVER DISEASE 03/15/2009  . RENAL CALCULUS 03/15/2009  . Sleep apnea 09/15/2008  . 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 Hyperlipidemia LDL goal <100 09/17/2007  . Hypertension goal BP (blood pressure) < 130/80 09/17/2007    Past Surgical History:  Procedure Laterality Date  . BIOPSY N/A 03/17/2013   Procedure:  GASTRIC BIOPSIES;  Surgeon: Danie Binder, MD;  Location: [facility];  Service: Endoscopy;  Laterality: N/A;  . BIOPSY  06/10/2015   Procedure: BIOPSY;  Surgeon: Danie Binder, MD;  Location: [facility];  Service: Endoscopy;;  gastric biopsy  . CARDIAC CATHETERIZATION  05-11-2003  dr Shelva Majestic ([facility])   Abnormal cardiolite/   Normal coronary arteries and normal LVF,  ef  63%  . CARDIOVASCULAR STRESS TEST  01-01-2014   normal lexiscan cardiolite/  no ischemia/ infarct/  normal LV function and wall motion ,  ef 81%  . COLONOSCOPY  last one 10/02/2011   [facility] [place]  TICS, IH, NEXT TCS APR 2018  . COLONOSCOPY WITH PROPOFOL N/A 02/26/2017   Procedure: COLONOSCOPY WITH PROPOFOL;  Surgeon: Danie Binder, MD;  Location: [facility];  Service: Endoscopy;  Laterality: N/A;  11:00am  . CYSTO/   RIGHT URETEROSOCOPY LASER LITHOTRIPSY STONE EXTRACTION  10-13-2004  . CYSTO/  RIGHT RETROGRADE PYELOGRAM/  PLACMENT RIGHT URETERAL STENT  01-10-2010  . CYSTOSCOPY W/ URETERAL STENT PLACEMENT Right 08/19/2015   Procedure: CYSTOSCOPY WITH RETROGRADE PYELOGRAM/URETERAL STENT PLACEMENT;  Surgeon: Franchot Gallo, MD;  Location: [facility];  Service: Urology;  Laterality: Right;  . CYSTOSCOPY WITH RETROGRADE PYELOGRAM, URETEROSCOPY AND STENT PLACEMENT Left 10/21/2014   Procedure: CYSTOSCOPY WITH RETROGRADE PYELOGRAM, URETEROSCOPY AND STENT PLACEMENT;  Surgeon: Franchot Gallo, MD;  Location: [facility];  Service: Urology;  Laterality: Left;  . CYSTOSCOPY WITH RETROGRADE PYELOGRAM, URETEROSCOPY AND STENT PLACEMENT Right 11/22/2015   Procedure: CYSTOSCOPY, RIGHT URETERAL STENT REMOVAL; RIGHT RETROGRADE PYELOGRAM, RIGHT URETEROSCOPY WITH BALLOON DILATION; RIGHT URETERAL STENT PLACEMENT;  Surgeon: Franchot Gallo, MD;  Location: [facility];  Service: Urology;  Laterality: Right;  . CYSTOSCOPY/RETROGRADE/URETEROSCOPY/STONE EXTRACTION WITH BASKET Bilateral 08/02/2015   Procedure: CYSTOSCOPY; BILATERAL RETROGRADE PYELOGRAMS; BILATERAL URETEROSCOPY, STONE EXTRACTION WITH BASKET;  Surgeon: Franchot Gallo, MD;  Location: [facility];  Service: Urology;  Laterality: Bilateral;  . CYSTOSCOPY/RETROGRADE/URETEROSCOPY/STONE EXTRACTION WITH BASKET Right 06/28/2015   Procedure: CYSTOSCOPY/RETROGRADE/URETEROSCOPY/STONE EXTRACTION WITH BASKET, RIGHT URETERAL DOUBLE J STENT PLACEMENT;  Surgeon: Franchot Gallo, MD;  Location: [facility];  Service: Urology;  Laterality: Right;  . ESOPHAGOGASTRODUODENOSCOPY (EGD) WITH PROPOFOL N/A 03/17/2013   Procedure: ESOPHAGOGASTRODUODENOSCOPY (EGD) WITH  PROPOFOL;  Surgeon: Danie Binder, MD;  Location: [facility];  Service: Endoscopy;  Laterality: N/A;  . ESOPHAGOGASTRODUODENOSCOPY (EGD) WITH PROPOFOL N/A 06/10/2015   Distal gastritis, distal esophageal stricture s/p dilation  . ESOPHAGOGASTRODUODENOSCOPY (EGD) WITH PROPOFOL N/A 02/26/2017   Procedure: ESOPHAGOGASTRODUODENOSCOPY (EGD) WITH PROPOFOL;  Surgeon: Danie Binder, MD;  [facility];  Service: Endoscopy;  Laterality: N/A;  . FLEXIBLE SIGMOIDOSCOPY  09/11/2011   [id number] Internal hemorrhoids  . HOLMIUM LASER APPLICATION Left 09/04/7122   Procedure: HOLMIUM LASER APPLICATION;  Surgeon: Franchot Gallo, MD;  Location: [facility];  Service: Urology;  Laterality: Left;  . HOLMIUM LASER APPLICATION Right 5/80/9983   Procedure: HOLMIUM LASER APPLICATION;  Surgeon: Franchot Gallo, MD;  Location: [facility];  Service: Urology;  Laterality: Right;  . MASS EXCISION Left 03/04/2015   Procedure: EXCISION OF SOFT TISSUE NEOPLASM LEFT ARM;  Surgeon: Aviva Signs, MD;  Location: [facility];  Service: General;  Laterality: Left;  . PERCUTANEOUS NEPHROSTOLITHOTOMY Bilateral 12/  2011     [facility]  . POLYPECTOMY N/A 03/17/2013   Procedure: GASTRIC POLYPECTOMY;  Surgeon: Danie Binder, MD;  Location: [facility];  Service: Endoscopy;  Laterality: N/A;  . POLYPECTOMY  02/26/2017   Procedure: POLYPECTOMY;  Surgeon:  Fields, Marga Melnick, MD;  Location[facility];  Service: Endoscopy;;  polyp at cecum, ascending colon polyps x3, hepatic flexure polyps x8, transverse colon polyps x8   . REMOVAL RIGHT THIGH CYST  2006  . SAVORY DILATION N/A 03/17/2013   Procedure: SAVORY DILATION;  Surgeon: Danie Binder, MD;  Location: [facility];  Service: Endoscopy;  Laterality: N/A;  #12.8, 14, 15, 16 dilators used  . SAVORY DILATION N/A 06/10/2015   Procedure: SAVORY DILATION;  Surgeon: Danie Binder, MD;  [facility];  Service: Endoscopy;  Laterality: N/A;  . TRANSTHORACIC ECHOCARDIOGRAM   01-01-2014   mild LVH/  ef 16-10%/  grade I diastolic dysfunction/  trivial MR, TR, and PR  . VAGINAL HYSTERECTOMY  1970's     OB History    Gravida  2   Para  2   Term  2   Preterm      AB      Living  2     SAB      TAB      Ectopic      Multiple      Live Births               Home Medications    Prior to Admission medications   Medication Sig Start Date End Date Taking? Authorizing Provider  ALPRAZolam Duanne Moron) 0.5 MG tablet Take one tablet at bedtime for sleep and anxiety, by mouth 10/22/18   [name], MD  amLODipine (NORVASC) 10 MG tablet TAKE 1 TABLET BY MOUTH EVERY DAY Patient taking differently: Take 10 mg by mouth daily.  12/06/17 12/10/18  [name], MD  aspirin EC 325 MG tablet Take 325 mg by mouth daily.    [provider]  calcium-vitamin D (OSCAL WITH D) 500-200 MG-UNIT tablet Take 1 tablet by mouth daily.    [provider]  diphenhydramine-acetaminophen (TYLENOL PM) 25-500 MG TABS tablet Take 1 tablet by mouth 2 (two) times daily.    [provider]  furosemide (LASIX) 40 MG tablet TAKE 1 TABLET DAILY AS NEEDED FOR SWELLING 12/10/18   Arnoldo Lenis, MD  iron polysaccharides (NIFEREX) 150 MG capsule Take 150 mg by mouth daily.    [provider]  lovastatin (MEVACOR) 40 MG tablet TAKE 1 TABLET BY MOUTH EVERYDAY AT BEDTIME Patient taking differently: 40 mg.  11/04/18   [name], MD  Melatonin 10 MG TABS Take 10 mg by mouth at bedtime.    [provider]  OXYGEN Inhale into the lungs daily. Bedtime    [provider]  pantoprazole (PROTONIX) 40 MG tablet TAKE 1 TABLET (40 MG TOTAL) BY MOUTH 2 (TWO) TIMES DAILY. 06/19/18   [name], MD  PARoxetine (PAXIL) 40 MG tablet TAKE 1 TABLET BY MOUTH EVERY DAY IN THE MORNING 10/17/18   [name], MD  potassium chloride (K-DUR) 10 MEQ tablet Take 1 tablet (10 mEq total) by mouth daily. 05/06/18   Julianne Rice,  MD  SYMBICORT 80-4.5 MCG/ACT inhaler INHALE 2 PUFFS INTO THE LUNGS TWICE A DAY 11/26/18   Perlie Mayo, NP  tiZANidine (ZANAFLEX) 4 MG tablet Take 1 tablet (4 mg total) by mouth every 6 (six) hours as needed for muscle spasms. 12/31/18   Carole Civil, MD  TRADJENTA 5 MG TABS tablet TAKE 1 TABLET BY MOUTH EVERY DAY 06/19/18   [name], MD  traMADol (ULTRAM) 50 MG tablet Take 1 tablet (50  mg total) by mouth every 6 (six) hours as needed. 07/16/18 07/16/19  Fransico Meadow, PA-C  vitamin B-12 (CYANOCOBALAMIN) 100 MCG tablet Take 100 mcg by mouth daily.    [provider]    Family History Family History  Problem Relation Age of Onset  . Hypertension Mother   . Diabetes Mother   . Heart failure Mother   . Dementia Mother   . Emphysema Father   . Hypertension Father   . Diabetes Brother   . GER disease Brother   . Hypertension Sister   . Hypertension Sister   . Cancer Other        family history   . Diabetes Other        family history   . Heart defect Other        famiily history   . Arthritis Other        family history   . Anesthesia problems Neg Hx   . Hypotension Neg Hx   . Malignant hyperthermia Neg Hx   . Pseudochol deficiency Neg Hx   . Colon cancer Neg Hx     Social History Social History   Tobacco Use  . Smoking status: Former Smoker    Packs/day: 1.00    Years: 20.00    Pack years: 20.00    Types: Cigarettes    Start date: 09/21/1974    Quit date: 09/20/1988    Years since quitting: 30.3  . Smokeless tobacco: Never Used  . Tobacco comment: Quit x 20 years  Substance Use Topics  . Alcohol use: No    Alcohol/week: 0.0 standard drinks  . Drug use: No     Allergies   Ace inhibitors, Keflex [cephalexin], Nitrofurantoin, and Penicillins   Review of Systems Review of Systems  Constitutional: Negative for chills and fever.  HENT: Negative for congestion, sore throat and trouble swallowing.   Eyes: Negative for visual disturbance.   Respiratory: Positive for cough. Negative for chest tightness and shortness of breath.   Cardiovascular: Negative for chest pain and leg swelling.  Gastrointestinal: Negative for abdominal pain, nausea and vomiting.  Genitourinary: Negative for decreased urine volume, difficulty urinating and dysuria.  Musculoskeletal: Positive for neck pain. Negative for back pain.  Skin: Negative for rash.  Neurological: Positive for weakness (generalized weakness). Negative for dizziness, seizures, syncope, numbness and headaches.  Psychiatric/Behavioral: Negative for confusion. The patient is not nervous/anxious.      Physical Exam Updated Vital Signs BP 112/62 (BP Location: Left Arm)   Pulse 60   Temp 97.7 F (36.5 C) (Oral)   Resp (!) 24   Ht _0  (1.676 m)   Wt 93 kg   SpO2 95%   BMI 33.09 kg/m   Physical Exam Vitals signs and nursing note reviewed.  Constitutional:      Appearance: Normal appearance. She is not ill-appearing or toxic-appearing.  HENT:     Head: Atraumatic.     Mouth/Throat:     Mouth: Mucous membranes are moist.     Pharynx: Oropharynx is clear.  Eyes:     Extraocular Movements: Extraocular movements intact.     Conjunctiva/sclera: Conjunctivae normal.     Pupils: Pupils are equal, round, and reactive to light.  Neck:     Musculoskeletal: Normal range of motion. Muscular tenderness present.     Comments: Mild ttp of left cervical paraspinal muscles.  No bony tenderness.  Pt has full ROM Cardiovascular:     Rate and Rhythm: Normal rate  and regular rhythm.     Pulses: Normal pulses.  Pulmonary:     Effort: Pulmonary effort is normal.     Breath sounds: Normal breath sounds. No wheezing.  Abdominal:     General: There is no distension.     Palpations: Abdomen is soft. There is no mass.     Tenderness: There is no abdominal tenderness. There is no right CVA tenderness, left CVA tenderness or guarding.  Musculoskeletal: Normal range of motion.        General:  No tenderness.     Right lower leg: No edema.     Left lower leg: No edema.     Comments: Neg SLR bilaterally  Lymphadenopathy:     Cervical: No cervical adenopathy.  Skin:    General: Skin is warm.     Capillary Refill: Capillary refill takes less than 2 seconds.     Findings: No rash.  Neurological:     General: No focal deficit present.     Mental Status: She is alert.     GCS: GCS eye subscore is 4. GCS verbal subscore is 5. GCS motor subscore is 6.     Sensory: Sensation is intact.     Motor: Motor function is intact. No pronator drift.     Coordination: Coordination is intact. Coordination normal. Finger-Nose-Finger Test normal.     Comments: CN II-XII intact.  Speech slow, but coherent.  No facial droop.  No pronator drift, nml finger nose testing,       ED Treatments / Results  Labs (all labs ordered are listed, but only abnormal results are displayed) Labs Reviewed  BASIC METABOLIC PANEL - Abnormal; Notable for the following components:      Result Value   Sodium 133 (*)    Glucose, Bld 160 (*)    Creatinine, Ser 2.22 (*)    GFR calc non Af Amer 21 (*)    GFR calc Af Amer 25 (*)    All other components within normal limits  SARS CORONAVIRUS 2 (HOSPITAL ORDER, [facility])  CBC WITH DIFFERENTIAL/PLATELET  LACTIC ACID, PLASMA  URINALYSIS, ROUTINE W REFLEX MICROSCOPIC  LACTIC ACID, PLASMA  TROPONIN I (HIGH SENSITIVITY)  TROPONIN I (HIGH SENSITIVITY)    EKG None  Radiology No results found.  Procedures Procedures (including critical care time)  Medications Ordered in ED Medications  sodium chloride 0.9 % bolus 1,000 mL (has no administration in time range)     Initial Impression / Assessment and Plan / ED Course  I have reviewed the triage vital signs and the nursing notes.  Pertinent labs & imaging results that were available during my care of the patient were reviewed by me and considered in my medical decision making (see  chart for details).    Generalized weakness w/o focal neruo deficits.  Seen by orthopedics earlier this week and given rx for Zanaflex.  Sx's possibly related to medication reaction.  Doubt sepsis or emergent neurological process. Blood pressure is soft and HR is low. Afebrile.  Will obtain labs and CXR, orthostatics and administer IVF's    Orthostatic VS for the past 24 hrs:  BP- Lying Pulse- Lying BP- Sitting Pulse- Sitting BP- Standing at 0 minutes Pulse- Standing at 0 minutes  01/02/19 1618 97/61 62 (!) 87/65 67 (!) 86/55 74   Orthostatic vitals measured prior to receiving IVF's.  Will repeat after fluid bolus.    Pt also seen by Dr. Lita Mains.  Pt discussed with Evalee Jefferson, PA-C at end of shift and she will assume care.      Final Clinical Impressions(s) / ED Diagnoses   Final diagnoses:  None    ED Discharge Orders    None       Bufford Lope 01/02/19 1716    Julianne Rice, MD 01/02/19 2213

## 2019-01-02 NOTE — H&P (Signed)
History and Physical    Cheryl Abbott DZH:[id number] DOB: December 21, 1945 DOA: 01/02/2019  PCP: [name], MD   Patient coming from: Home  I have personally briefly reviewed patient's old medical records in [place]  Chief Complaint: Generalized weakness  HPI: Cheryl Abbott is a 73 y.o. female with medical history significant for diabetes mellitus, hypertension, CKD 3, multiple myeloma, CVA, anxiety and depression, to the ED with complaints of generalized weakness for several days to weeks.  Patient reports chronic poor p.o. intake, she denies vomiting no loose stools, no abdominal pain.  No pain with urination.  She reports urinary frequency mostly at night, for a while now she is unable to tell me how long.  She reports a mild cough that is mostly dry, for about 2 weeks duration.  Patient denies fever or chills.  ED Course: Patient 97.9, heart rate 50s to 60s, blood pressure initially 112/62 dropped to 77/39 in the ED, and improved with 2 L bolus normal saline.  O2 sats greater than 93% on room air.  WBC 6.  Normal lactic acid 1.3.  High-sensitivity troponin 4 x 2.  UA with large leukocytes few bacteria greater than 50 WBCs.  Portable chest x-ray shows elevation of the right diaphragm, right base atelectasis.  EKG sinus rhythm without significant abnormalities.  Patient was started on IV ciprofloxacin, patient has a penicillin allergy. Hospitalist to admit for UTI and generalized weakness  Review of Systems: As per HPI all other systems reviewed and negative.  Past Medical History:  Diagnosis Date  . Anemia   . Anxiety   . Anxiety disorder   . Arthritis   . Cancer ([place])    acute myeloma  . 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 Chronic back pain   . Depression   . Fatty liver   . GERD (gastroesophageal reflux disease)   . Headache   . History of adenomatous polyp of colon    2008  . History of kidney stones   . Hypercalcemia   . Hyperlipidemia   .  Hypertension   . Left ureteral stone   . Lipoma of arm 01/19/2015  . Lumbar disc disease with radiculopathy   . Nephrolithiasis   . Neuropathy, peripheral   . OSA treated with BiPAP    per study 2007  . Sciatica   . Shoulder pain, bilateral   . Sigmoid diverticulosis   . Type 2 diabetes mellitus ([place])     Past Surgical History:  Procedure Laterality Date  . BIOPSY N/A 03/17/2013   Procedure: GASTRIC BIOPSIES;  Surgeon: Danie Binder, MD;  Location: [facility];  Service: Endoscopy;  Laterality: N/A;  . BIOPSY  06/10/2015   Procedure: BIOPSY;  Surgeon: Danie Binder, MD;  [facility];  Service: Endoscopy;;  gastric biopsy  . CARDIAC CATHETERIZATION  05-11-2003  dr Shelva Majestic ([facility])   Abnormal cardiolite/   Normal coronary arteries and normal LVF,  ef  63%  . CARDIOVASCULAR STRESS TEST  01-01-2014   normal lexiscan cardiolite/  no ischemia/ infarct/  normal LV function and wall motion ,  ef 81%  . COLONOSCOPY  last one 10/02/2011   MOD [facility], IH, NEXT TCS APR 2018  . COLONOSCOPY WITH PROPOFOL N/A 02/26/2017   Procedure: COLONOSCOPY WITH PROPOFOL;  Surgeon: Danie Binder, MD;  [facility];  Service: Endoscopy;  Laterality: N/A;  11:00am  . CYSTO/   RIGHT URETEROSOCOPY LASER LITHOTRIPSY STONE EXTRACTION  10-13-2004  . CYSTO/  RIGHT RETROGRADE PYELOGRAM/  PLACMENT RIGHT URETERAL STENT  01-10-2010  . CYSTOSCOPY W/ URETERAL STENT PLACEMENT Right 08/19/2015   Procedure: CYSTOSCOPY WITH RETROGRADE PYELOGRAM/URETERAL STENT PLACEMENT;  Surgeon: Franchot Gallo, MD;  Location: [facility];  Service: Urology;  Laterality: Right;  . CYSTOSCOPY WITH RETROGRADE PYELOGRAM, URETEROSCOPY AND STENT PLACEMENT Left 10/21/2014   Procedure: CYSTOSCOPY WITH RETROGRADE PYELOGRAM, URETEROSCOPY AND STENT PLACEMENT;  Surgeon: Franchot Gallo, MD;  Location[facility];  Service: Urology;  Laterality: Left;  . CYSTOSCOPY WITH RETROGRADE PYELOGRAM,  URETEROSCOPY AND STENT PLACEMENT Right 11/22/2015   Procedure: CYSTOSCOPY, RIGHT URETERAL STENT REMOVAL; RIGHT RETROGRADE PYELOGRAM, RIGHT URETEROSCOPY WITH BALLOON DILATION; RIGHT URETERAL STENT PLACEMENT;  Surgeon: Franchot Gallo, MD;  [facility];  Service: Urology;  Laterality: Right;  . CYSTOSCOPY/RETROGRADE/URETEROSCOPY/STONE EXTRACTION WITH BASKET Bilateral 08/02/2015   Procedure: CYSTOSCOPY; BILATERAL RETROGRADE PYELOGRAMS; BILATERAL URETEROSCOPY, STONE EXTRACTION WITH BASKET;  Surgeon: Franchot Gallo, MD;  Location[facility];  Service: Urology;  Laterality: Bilateral;  . CYSTOSCOPY/RETROGRADE/URETEROSCOPY/STONE EXTRACTION WITH BASKET Right 06/28/2015   Procedure: CYSTOSCOPY/RETROGRADE/URETEROSCOPY/STONE EXTRACTION WITH BASKET, RIGHT URETERAL DOUBLE J STENT PLACEMENT;  Surgeon: Franchot Gallo, MD;  [facility];  Service: Urology;  Laterality: Right;  . ESOPHAGOGASTRODUODENOSCOPY (EGD) WITH PROPOFOL N/A 03/17/2013   Procedure: ESOPHAGOGASTRODUODENOSCOPY (EGD) WITH PROPOFOL;  Surgeon: Danie Binder, MD;  [facility];  Service: Endoscopy;  Laterality: N/A;  . ESOPHAGOGASTRODUODENOSCOPY (EGD) WITH PROPOFOL N/A 06/10/2015   Distal gastritis, distal esophageal stricture s/p dilation  . ESOPHAGOGASTRODUODENOSCOPY (EGD) WITH PROPOFOL N/A 02/26/2017   Procedure: ESOPHAGOGASTRODUODENOSCOPY (EGD) WITH PROPOFOL;  Surgeon: Danie Binder, MD;  [facility];  Service: Endoscopy;  Laterality: N/A;  . FLEXIBLE SIGMOIDOSCOPY  09/11/2011   [id number] Internal hemorrhoids  . HOLMIUM LASER APPLICATION Left 7/86/7544   Procedure: HOLMIUM LASER APPLICATION;  Surgeon: Franchot Gallo, MD;  Location: [facility];  Service: Urology;  Laterality: Left;  . HOLMIUM LASER APPLICATION Right 02/28/1006   Procedure: HOLMIUM LASER APPLICATION;  Surgeon: Franchot Gallo, MD;  [facility];  Service: Urology;  Laterality: Right;  . MASS EXCISION Left 03/04/2015   Procedure:  EXCISION OF SOFT TISSUE NEOPLASM LEFT ARM;  Surgeon: Aviva Signs, MD;  Location: [facility];  Service: General;  Laterality: Left;  . PERCUTANEOUS NEPHROSTOLITHOTOMY Bilateral 12/  2011     [facility]  . POLYPECTOMY N/A 03/17/2013   Procedure: GASTRIC POLYPECTOMY;  Surgeon: Danie Binder, MD;  Location[facility];  Service: Endoscopy;  Laterality: N/A;  . POLYPECTOMY  02/26/2017   Procedure: POLYPECTOMY;  Surgeon: Danie Binder, MD;  [facility];  Service: Endoscopy;;  polyp at cecum, ascending colon polyps x3, hepatic flexure polyps x8, transverse colon polyps x8   . REMOVAL RIGHT THIGH CYST  2006  . SAVORY DILATION N/A 03/17/2013   Procedure: SAVORY DILATION;  Surgeon: Danie Binder, MD;  Location: [facility];  Service: Endoscopy;  Laterality: N/A;  #12.8, 14, 15, 16 dilators used  . SAVORY DILATION N/A 06/10/2015   Procedure: SAVORY DILATION;  Surgeon: Danie Binder, MD;  [facility];  Service: Endoscopy;  Laterality: N/A;  . TRANSTHORACIC ECHOCARDIOGRAM  01-01-2014   mild LVH/  ef 12-19%/  grade I diastolic dysfunction/  trivial MR, TR, and PR  . VAGINAL HYSTERECTOMY  1970's     reports that she quit smoking about 30 years ago. Her smoking use included cigarettes. She started smoking about 44 years ago. She has a 20.00 pack-year smoking history.  She has never used smokeless tobacco. She reports that she does not drink alcohol or use drugs.  Allergies  Allergen Reactions  . Ace Inhibitors Cough  . Keflex [Cephalexin] Diarrhea and Nausea And Vomiting  . Nitrofurantoin Diarrhea and Nausea And Vomiting  . Penicillins Hives and Other (See Comments)    Reaction:  Blisters on hands/feet  Has patient had a PCN reaction causing immediate rash, facial/tongue/throat swelling, SOB or lightheadedness with hypotension: No Has patient had a PCN reaction causing severe rash involving mucus membranes or skin necrosis: No Has patient had a PCN reaction that required hospitalization No Has  patient had a PCN reaction occurring within the last 10 years: No If all of the above answers are "NO", then may proceed with Cephalosporin use.    Family History  Problem Relation Age of Onset  . Hypertension Mother   . Diabetes Mother   . Heart failure Mother   . Dementia Mother   . Emphysema Father   . Hypertension Father   . Diabetes Brother   . GER disease Brother   . Hypertension Sister   . Hypertension Sister   . Cancer Other        family history   . Diabetes Other        family history   . Heart defect Other        famiily history   . Arthritis Other        family history   . Anesthesia problems Neg Hx   . Hypotension Neg Hx   . Malignant hyperthermia Neg Hx   . Pseudochol deficiency Neg Hx   . Colon cancer Neg Hx     Prior to Admission medications   Medication Sig Start Date End Date Taking? Authorizing Provider  ALPRAZolam Duanne Moron) 0.5 MG tablet Take one tablet at bedtime for sleep and anxiety, by mouth Patient taking differently: Take 0.5 mg by mouth at bedtime.  10/22/18  Yes [name], MD  amLODipine (NORVASC) 10 MG tablet TAKE 1 TABLET BY MOUTH EVERY DAY Patient taking differently: Take 10 mg by mouth daily.  12/06/17 01/02/19 Yes [name], MD  aspirin EC 325 MG tablet Take 325 mg by mouth daily.   Yes [provider]  calcium-vitamin D (OSCAL WITH D) 500-200 MG-UNIT tablet Take 1 tablet by mouth daily.   Yes [provider]  diphenhydramine-acetaminophen (TYLENOL PM) 25-500 MG TABS tablet Take 2 tablets by mouth at bedtime.    Yes [provider]  furosemide (LASIX) 40 MG tablet TAKE 1 TABLET DAILY AS NEEDED FOR SWELLING Patient taking differently: Take 40 mg by mouth daily as needed for fluid.  12/10/18  Yes Branch, Alphonse Guild, MD  lovastatin (MEVACOR) 40 MG tablet TAKE 1 TABLET BY MOUTH EVERYDAY AT BEDTIME Patient taking differently: Take 40 mg by mouth at bedtime.  11/04/18  Yes [name], MD  Melatonin  10 MG TABS Take 10 mg by mouth at bedtime.   Yes [provider]  pantoprazole (PROTONIX) 40 MG tablet TAKE 1 TABLET (40 MG TOTAL) BY MOUTH 2 (TWO) TIMES DAILY. 06/19/18  Yes [name], MD  PARoxetine (PAXIL) 40 MG tablet TAKE 1 TABLET BY MOUTH EVERY DAY IN THE MORNING Patient taking differently: Take 40 mg by mouth every morning.  10/17/18  Yes [name], MD  potassium chloride (K-DUR) 10 MEQ tablet Take 1 tablet (10 mEq total) by mouth daily. 05/06/18  Yes Julianne Rice, MD  SYMBICORT 80-4.5  MCG/ACT inhaler INHALE 2 PUFFS INTO THE LUNGS TWICE A DAY 11/26/18  Yes Perlie Mayo, NP  tiZANidine (ZANAFLEX) 4 MG tablet Take 1 tablet (4 mg total) by mouth every 6 (six) hours as needed for muscle spasms. 12/31/18  Yes Carole Civil, MD  TRADJENTA 5 MG TABS tablet TAKE 1 TABLET BY MOUTH EVERY DAY 06/19/18  Yes [name], MD  traZODone (DESYREL) 100 MG tablet Take 100 mg by mouth at bedtime.   Yes [provider]    Physical Exam: Vitals:   01/02/19 1630 01/02/19 1800 01/02/19 1900 01/02/19 2041  BP: (!) 97/55 (!) 77/39 104/61 (!) 109/95  Pulse: 61 61  (!) 55  Resp: 16 (!) 24 17 18   Temp:    97.9 F (36.6 C)  TempSrc:    Oral  SpO2: 95% 93%  97%  Weight:      Height:        Constitutional: NAD, calm, comfortable Vitals:   01/02/19 1630 01/02/19 1800 01/02/19 1900 01/02/19 2041  BP: (!) 97/55 (!) 77/39 104/61 (!) 109/95  Pulse: 61 61  (!) 55  Resp: 16 (!) 24 17 18   Temp:    97.9 F (36.6 C)  TempSrc:    Oral  SpO2: 95% 93%  97%  Weight:      Height:       Eyes: PERRL, lids and conjunctivae normal ENMT: Mucous membranes are moist. Posterior pharynx clear of any exudate or lesions. Neck: normal, supple, no masses, no thyromegaly Respiratory: clear to auscultation bilaterally, no wheezing, no crackles. Normal respiratory effort. No accessory muscle use.  Cardiovascular: Regular rate and rhythm, no murmurs / rubs / gallops. No extremity  edema. 2+ pedal pulses.   Abdomen: no tenderness, no masses palpated. No hepatosplenomegaly. Bowel sounds positive.  Musculoskeletal: no clubbing / cyanosis. No joint deformity upper and lower extremities. Good ROM, no contractures. Normal muscle tone.  Skin: no rashes, lesions, ulcers. No induration Neurologic: CN 2-12 grossly intact.  Moving all extremities spontaneously. Psychiatric: Normal judgment and insight. Alert and oriented x 3. Normal mood.   Labs on Admission: I have personally reviewed following labs and imaging studies  CBC: Recent Labs  Lab 01/02/19 1537  WBC 6.0  NEUTROABS 3.6  HGB 12.4  HCT 38.7  MCV 98.7  PLT 280   Basic Metabolic Panel: Recent Labs  Lab 01/02/19 1537  NA 133*  K 3.8  CL 102  CO2 23  GLUCOSE 160*  BUN 19  CREATININE 2.22*  CALCIUM 9.2   CBG: Recent Labs  Lab 01/02/19 2039  GLUCAP 106*   Urine analysis:    Component Value Date/Time   COLORURINE YELLOW 01/02/2019 1700   APPEARANCEUR CLOUDY (A) 01/02/2019 1700   LABSPEC 1.018 01/02/2019 1700   PHURINE 5.0 01/02/2019 1700   GLUCOSEU NEGATIVE 01/02/2019 1700   HGBUR LARGE (A) 01/02/2019 1700   HGBUR moderate 02/07/2010 0907   BILIRUBINUR NEGATIVE 01/02/2019 1700   BILIRUBINUR neg 09/06/2015 1401   KETONESUR NEGATIVE 01/02/2019 1700   PROTEINUR 30 (A) 01/02/2019 1700   UROBILINOGEN 0.2 09/06/2015 1401   UROBILINOGEN 0.2 03/01/2015 1600   NITRITE NEGATIVE 01/02/2019 1700   LEUKOCYTESUR LARGE (A) 01/02/2019 1700    Radiological Exams on Admission: Dg Chest Portable 1 View  Result Date: 01/02/2019 CLINICAL DATA:  Weakness, history myeloma, diabetes mellitus, hypertension EXAM: PORTABLE CHEST 1 VIEW COMPARISON:  Portable exam 1553 hours compared to 07/16/2018 FINDINGS: Normal heart size, mediastinal contours, and pulmonary vascularity. Chronic elevation  of RIGHT diaphragm with RIGHT basilar atelectasis. Remaining lungs clear. No infiltrate, pleural effusion or pneumothorax. No  acute osseous findings. IMPRESSION: Chronic elevation of RIGHT diaphragm and RIGHT basilar atelectasis. No new abnormalities. Electronically Signed   By: Lavonia Dana M.D.   On: 01/02/2019 17:30    EKG: Independently reviewed.  Sinus rhythm rate 63, QTc 428.  No significant changes compared to prior.  Assessment/Plan Active Problems:   Sepsis ([facility])    UTI sepsis-with normal lactic acid 1.3.  Patient with urinary frequency, afebrile, WBC 6.  Tachypneic to 26, with hypotension blood pressure down to 77/39, likely prerenal component, with acute kidney injury.  Reported cough, without dyspnea, chest x-ray right base atelectasis.  COVID-19 test negative.  2 L bolus given in ED with improvement in blood pressure.  No recent urine cultures.  IV ciprofloxacin started in ED (penicillin allergy) -Talked to Pharmacy, Patient has tolerated IV ceftriaxone in the recent past. -Considering high rates of resistance to quinolones, will start IV ceftriaxone -Follow-up blood and urine cultures obtained in ED - N/s + 20kcl 100cc/hr x 15hrs -BMP, CBC a.m.  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  Not on chemotherapy.  ??contribution to patient's chronic generalized weakness.  Diabetes mellitus-random glucose 160. - SSI -Continue home Tradjenta  Hypertension-hypotensive in ED down to 77 systolic. -Hold home Norvasc 10, Lasix 40 daily  AKI on CKD3-creatinine 2.2, baseline 1.5-1.7.  Likely prerenal, from poor p.o. intake and diuretics. -Hold home Lasix -Hydrate -BMP a.m.  Depression and anxiety- - continue home nightly Xanax, paroxetine, trazodone nightly   DVT prophylaxis: Lovenox Code Status: Full Family Communication: None at bedside Disposition Plan: per rounding team Consults called: None Admission status: Inpatient, tele I certify that at the point of admission it is my clinical judgment that the patient will require inpatient hospital care spanning beyond 2  midnights from the point of admission due to high intensity of service, high risk for further deterioration and high frequency of surveillance required. The following factors support the patient status of inpatient: UTI sepsis with significant hypotension, requiring IV antibiotics, continuous fluids and close monitoring pending blood and urine cultures.   Bethena Roys MD [facility]  01/02/2019, 9:49 PM

## 2019-01-02 NOTE — ED Provider Notes (Signed)
Patient signed out to me from [place], [place].  Ms. Scharfenberg is a 73 year old with complaints of generalized weakness and cough.  History significant for multiple myeloma but has not yet started chemo treatments.  During ED visit her laboratory tests indicate worsened but chronic renal insufficiency, chest x-ray was clear for any pulmonary infection, she does have a UTI, during this ED visit her blood pressures remain soft despite not developing tachycardia.  She is on Norvasc for blood pressure control.  No beta-blockers.  She was given a liter of IV fluids, but systolic pressures continued to decline into the 70s.  Second IV bolus ordered.  Cipro ordered given infected urine along with blood cultures and urine culture as well.  Patient will require admission for further antibiotics and supportive care.  She is allergic to penicillin.  She is mentating well and is in no acute distress.   Evalee Jefferson, PA-C 01/02/19 1825    Julianne Rice, MD 01/02/19 2213

## 2019-01-02 NOTE — Telephone Encounter (Signed)
Left another message for her, asked her to call back let me know she has gotten the information

## 2019-01-02 NOTE — ED Notes (Signed)
Patient refused a bed pan and requested a bed side commode. However, she she was also unable to void with bedside commode and stated she needed to have an in and out cath, notified provider.

## 2019-01-03 DIAGNOSIS — N3000 Acute cystitis without hematuria: Secondary | ICD-10-CM

## 2019-01-03 DIAGNOSIS — I959 Hypotension, unspecified: Secondary | ICD-10-CM

## 2019-01-03 LAB — BASIC METABOLIC PANEL
Anion gap: 6 (ref 5–15)
BUN: 14 mg/dL (ref 8–23)
CO2: 24 mmol/L (ref 22–32)
Calcium: 8.7 mg/dL — ABNORMAL LOW (ref 8.9–10.3)
Chloride: 107 mmol/L (ref 98–111)
Creatinine, Ser: 1.61 mg/dL — ABNORMAL HIGH (ref 0.44–1.00)
GFR calc Af Amer: 36 mL/min — ABNORMAL LOW (ref >60)
GFR calc non Af Amer: 31 mL/min — ABNORMAL LOW (ref >60)
Glucose, Bld: 106 mg/dL — ABNORMAL HIGH (ref 70–99)
Potassium: 4.4 mmol/L (ref 3.5–5.1)
Sodium: 137 mmol/L (ref 135–145)

## 2019-01-03 LAB — CBC
HCT: 36.7 % (ref 36.0–46.0)
Hemoglobin: 11.5 g/dL — ABNORMAL LOW (ref 12.0–15.0)
MCH: 31.2 pg (ref 26.0–34.0)
MCHC: 31.3 g/dL (ref 30.0–36.0)
MCV: 99.5 fL (ref 80.0–100.0)
Platelets: 202 10*3/uL (ref 150–400)
RBC: 3.69 MIL/uL — ABNORMAL LOW (ref 3.87–5.11)
RDW: 15 % (ref 11.5–15.5)
WBC: 6 10*3/uL (ref 4.0–10.5)
nRBC: 0 % (ref 0.0–0.2)

## 2019-01-03 LAB — GLUCOSE, CAPILLARY
Glucose-Capillary: 97 mg/dL (ref 70–99)
Glucose-Capillary: 98 mg/dL (ref 70–99)
Glucose-Capillary: 77 mg/dL (ref 70–99)

## 2019-01-03 MED ORDER — POTASSIUM CHLORIDE IN NACL 20-0.9 MEQ/L-% IV SOLN
INTRAVENOUS | Status: DC
  Administered 2019-01-03: 17:00:00 via INTRAVENOUS

## 2019-01-03 MED ORDER — ENOXAPARIN SODIUM 40 MG/0.4ML ~~LOC~~ SOLN
40.0000 mg | SUBCUTANEOUS | Status: DC
  Administered 2019-01-04 – 2019-01-05 (×2): 40 mg via SUBCUTANEOUS
  Filled 2019-01-03 (×2): qty 0.4

## 2019-01-03 NOTE — Progress Notes (Signed)
Patients Dulera has not been delivered at this time, unable to administer.

## 2019-01-03 NOTE — Progress Notes (Signed)
PROGRESS NOTE    Cheryl Abbott  BFX:[id number]  DOB: 29-Aug-1945  DOA: 01/02/2019 PCP: [name], MD   Brief Admission Hx: 73 y.o. female with medical history significant for diabetes mellitus, hypertension, CKD 3, multiple myeloma, CVA, anxiety and depression, to the ED with complaints of generalized weakness for several days to weeks.   MDM/Assessment & Plan:   1. Sepsis secondary to UTI-patient arrived with tachypnea and tachycardia and hypotension.  Patient has responded well to IV antibiotics.  Following urine culture.  Patient is tolerating ceftriaxone IV.  Plan to 3 full doses of culture sensitive IV antibiotics.  Continue supportive therapy. 2. Multiple myeloma-patient is followed by Dr. Delton Coombes.  I have recommended that she follow-up outpatient as soon as she is discharged. 3. Diabetes mellitus type 2- she is being treated with sliding scale insulin and Tradjenta. 4. Essential hypertension-patient was initially hypotensive on arrival which has resolved with IV fluid hydration.  Her home Lasix and Norvasc on hold. 5. AKI on CKD stage III-creatinine is slightly improved with IV fluid hydration.  Recheck in a.m. 6. Depression and anxiety-continue fluoxetine trazodone and alprazolam.  DVT prophylaxis: Enoxaparin Code Status: Full  Family Communication: daughter/telephone Disposition Plan: inpatient    Consultants:    Procedures:    Antimicrobials:  Ciprofloxacin IV x 1 dose 7/24  Ceftriaxone 7/24>>   Subjective: Pt says that she remains weak.    Objective: Vitals:   01/02/19 1900 01/02/19 2041 01/03/19 0504 01/03/19 1128  BP: 104/61 (!) 109/95 101/65   Pulse:  (!) 55 (!) 58   Resp: _0 Temp:  97.9 F (36.6 C) 98 F (36.7 C)   TempSrc:  Oral Oral   SpO2:  97% 96% 94%  Weight:      Height:        Intake/Output Summary (Last 24 hours) at 01/03/2019 1446 Last data filed at 01/03/2019 1200 Gross per 24 hour  Intake 2063.28 ml  Output  1863 ml  Net 200.28 ml   Filed Weights   01/02/19 1431  Weight: 93 kg     REVIEW OF SYSTEMS  As per history otherwise all reviewed and reported negative  Exam:  General exam: awake, alert, NAD, cooperative.  Respiratory system: Clear. No increased work of breathing. Cardiovascular system: S1 & S2 heard. No JVD, murmurs, gallops, clicks or pedal edema. Gastrointestinal system: Abdomen is nondistended, soft and nontender. Normal bowel sounds heard. Central nervous system: Alert and oriented. No focal neurological deficits. Extremities: no CCE.  Data Reviewed: Basic Metabolic Panel: Recent Labs  Lab 01/02/19 1537 01/03/19 0610  NA 133* 137  K 3.8 4.4  CL 102 107  CO2 23 24  GLUCOSE 160* 106*  BUN 19 14  CREATININE 2.22* 1.61*  CALCIUM 9.2 8.7*   Liver Function Tests: No results for input(s): AST, ALT, ALKPHOS, BILITOT, PROT, ALBUMIN in the last 168 hours. No results for input(s): LIPASE, AMYLASE in the last 168 hours. No results for input(s): AMMONIA in the last 168 hours. CBC: Recent Labs  Lab 01/02/19 1537 01/03/19 0610  WBC 6.0 6.0  NEUTROABS 3.6  --   HGB 12.4 11.5*  HCT 38.7 36.7  MCV 98.7 99.5  PLT 233 202   Cardiac Enzymes: No results for input(s): CKTOTAL, CKMB, CKMBINDEX, TROPONINI in the last 168 hours. CBG (last 3)  Recent Labs    01/02/19 2039 01/03/19 0714 01/03/19 1135  GLUCAP 106* 97 98   Recent Results (from the past 240 hour(s))  SARS Coronavirus 2 (CEPHEID- Performed in [place] hospital lab), Hosp Order     Status: None   Collection Time: 01/02/19  4:16 PM   Specimen: Nasopharyngeal Swab  Result Value Ref Range Status   SARS Coronavirus 2 NEGATIVE NEGATIVE Final    Comment: (NOTE) If result is NEGATIVE SARS-CoV-2 target nucleic acids are NOT DETECTED. The SARS-CoV-2 RNA is generally detectable in upper and lower  respiratory specimens during the acute phase of infection. The lowest  concentration of SARS-CoV-2 viral copies  this assay can detect is 250  copies / mL. A negative result does not preclude SARS-CoV-2 infection  and should not be used as the sole basis for treatment or other  patient management decisions.  A negative result may occur with  improper specimen collection / handling, submission of specimen other  than nasopharyngeal swab, presence of viral mutation(s) within the  areas targeted by this assay, and inadequate number of viral copies  (<250 copies / mL). A negative result must be combined with clinical  observations, patient history, and epidemiological information. If result is POSITIVE SARS-CoV-2 target nucleic acids are DETECTED. The SARS-CoV-2 RNA is generally detectable in upper and lower  respiratory specimens dur ing the acute phase of infection.  Positive  results are indicative of active infection with SARS-CoV-2.  Clinical  correlation with patient history and other diagnostic information is  necessary to determine patient infection status.  Positive results do  not rule out bacterial infection or co-infection with other viruses. If result is PRESUMPTIVE POSTIVE SARS-CoV-2 nucleic acids MAY BE PRESENT.   A presumptive positive result was obtained on the submitted specimen  and confirmed on repeat testing.  While 2019 novel coronavirus  (SARS-CoV-2) nucleic acids may be present in the submitted sample  additional confirmatory testing may be necessary for epidemiological  and / or clinical management purposes  to differentiate between  SARS-CoV-2 and other Sarbecovirus currently known to infect humans.  If clinically indicated additional testing with an alternate test  methodology [phone]) is advised. The SARS-CoV-2 RNA is generally  detectable in upper and lower respiratory sp ecimens during the acute  phase of infection. The expected result is Negative. Fact Sheet for Patients:  StrictlyIdeas.no Fact Sheet for Healthcare Providers:  BankingDealers.co.za This test is not yet approved or cleared by the [place] FDA and has been authorized for detection and/or diagnosis of SARS-CoV-2 by FDA under an Emergency Use Authorization (EUA).  This EUA will remain in effect (meaning this test can be used) for the duration of the COVID-19 declaration under Section 564(b)(1) of the Act, 21 U.S.C. section 360bbb-3(b)(1), unless the authorization is terminated or revoked sooner. Performed at [facility], [address], [place], [place] [address]   Blood culture (routine x 2)     Status: None (Preliminary result)   Collection Time: 01/02/19  6:41 PM   Specimen: BLOOD RIGHT HAND  Result Value Ref Range Status   Specimen Description BLOOD RIGHT HAND  Final   Special Requests   Final    BOTTLES DRAWN AEROBIC AND ANAEROBIC Blood Culture adequate volume   Culture   Final    NO GROWTH < 12 HOURS Performed at [facility], [address], [place], [place] [address]    Report Status PENDING  Incomplete  Blood culture (routine x 2)     Status: None (Preliminary result)   Collection Time: 01/02/19  6:49 PM   Specimen: BLOOD LEFT ARM  Result Value Ref Range Status  Specimen Description BLOOD LEFT ARM  Final   Special Requests   Final    BOTTLES DRAWN AEROBIC AND ANAEROBIC Blood Culture adequate volume   Culture   Final    NO GROWTH < 12 HOURS Performed at [facility], [address], [place], [place] [address]    Report Status PENDING  Incomplete     Studies: Dg Chest Portable 1 View  Result Date: 01/02/2019 CLINICAL DATA:  Weakness, history myeloma, diabetes mellitus, hypertension EXAM: PORTABLE CHEST 1 VIEW COMPARISON:  Portable exam 1553 hours compared to 07/16/2018 FINDINGS: Normal heart size, mediastinal contours, and pulmonary vascularity. Chronic elevation of RIGHT diaphragm with RIGHT basilar atelectasis. Remaining lungs clear. No infiltrate, pleural effusion or pneumothorax. No acute osseous  findings. IMPRESSION: Chronic elevation of RIGHT diaphragm and RIGHT basilar atelectasis. No new abnormalities. Electronically Signed   By: Lavonia Dana M.D.   On: 01/02/2019 17:30     Scheduled Meds: . ALPRAZolam  0.5 mg Oral QHS  . aspirin EC  325 mg Oral Daily  . [START ON 01/04/2019] enoxaparin (LOVENOX) injection  40 mg Subcutaneous Q24H  . insulin aspart  0-9 Units Subcutaneous TID WC  . linagliptin  5 mg Oral Daily  . mometasone-formoterol  2 puff Inhalation BID  . pantoprazole  40 mg Oral BID  . PARoxetine  40 mg Oral q morning - 10a  . pravastatin  40 mg Oral q1800  . traZODone  100 mg Oral QHS   Continuous Infusions: . cefTRIAXone (ROCEPHIN)  IV Stopped (01/02/19 2257)    Active Problems:   Sepsis ([place])   Time spent:   Irwin Brakeman, MD [facility] 01/03/2019, 2:46 PM    LOS: 1 day  How to contact the [facility] Attending or Consulting provider [place] or covering provider during after hours [place], for this patient?  1. Check the care team in [facility] and look for a) attending/consulting TRH provider listed and b) the [facility] team listed 2. Log into [email or web address] and use [place]'s universal password to access. If you do not have the password, please contact the hospital operator. 3. Locate the [facility] provider you are looking for under [facility] and page to a number that you can be directly reached. 4. If you still have difficulty reaching the provider, please page the [facility] (Director on Call) for the Hospitalists listed on amion for assistance.

## 2019-01-04 DIAGNOSIS — E785 Hyperlipidemia, unspecified: Secondary | ICD-10-CM

## 2019-01-04 DIAGNOSIS — R350 Frequency of micturition: Secondary | ICD-10-CM

## 2019-01-04 DIAGNOSIS — N390 Urinary tract infection, site not specified: Secondary | ICD-10-CM

## 2019-01-04 DIAGNOSIS — N3001 Acute cystitis with hematuria: Secondary | ICD-10-CM

## 2019-01-04 DIAGNOSIS — N183 Chronic kidney disease, stage 3 (moderate): Secondary | ICD-10-CM

## 2019-01-04 DIAGNOSIS — I10 Essential (primary) hypertension: Secondary | ICD-10-CM

## 2019-01-04 DIAGNOSIS — K219 Gastro-esophageal reflux disease without esophagitis: Secondary | ICD-10-CM

## 2019-01-04 LAB — URINE CULTURE
Culture: NO GROWTH
Special Requests: NORMAL

## 2019-01-04 LAB — GLUCOSE, CAPILLARY
Glucose-Capillary: 100 mg/dL — ABNORMAL HIGH (ref 70–99)
Glucose-Capillary: 119 mg/dL — ABNORMAL HIGH (ref 70–99)

## 2019-01-04 MED ORDER — AMLODIPINE BESYLATE 5 MG PO TABS
10.0000 mg | ORAL_TABLET | Freq: Every day | ORAL | Status: DC
  Administered 2019-01-04 – 2019-01-05 (×2): 10 mg via ORAL
  Filled 2019-01-04 (×2): qty 2

## 2019-01-04 MED ORDER — TIZANIDINE HCL 2 MG PO TABS
2.0000 mg | ORAL_TABLET | Freq: Three times a day (TID) | ORAL | Status: DC | PRN
  Administered 2019-01-04: 2 mg via ORAL
  Filled 2019-01-04: qty 1

## 2019-01-04 MED ORDER — MUSCLE RUB 10-15 % EX CREA
TOPICAL_CREAM | CUTANEOUS | Status: DC | PRN
  Administered 2019-01-04: 10:00:00 via TOPICAL
  Filled 2019-01-04 (×2): qty 85

## 2019-01-04 MED ORDER — SALINE SPRAY 0.65 % NA SOLN
1.0000 | NASAL | Status: DC | PRN
  Administered 2019-01-04: 1 via NASAL
  Filled 2019-01-04: qty 44

## 2019-01-04 NOTE — Progress Notes (Signed)
PROGRESS NOTE  Cheryl HOEL  PNT:[id number]  DOB: 1946/04/25  DOA: 01/02/2019 PCP: [name], MD   Brief Admission Hx: 73 y.o. female with medical history significant for diabetes mellitus, hypertension, CKD 3, multiple myeloma, CVA, anxiety and depression, to the ED with complaints of generalized weakness for several days to weeks.   MDM/Assessment & Plan:   1. Sepsis secondary to UTI-RESOLVED.  Patient arrived with tachypnea and tachycardia and hypotension.  Patient has responded well to IV antibiotics.  BP has normalized.  Awaiting urine culture.  Patient is tolerating ceftriaxone IV.  Plan to 3 full doses of culture sensitive IV antibiotics.  Continue supportive therapy. 2. Hypotension - BP seems to have normalized and now rising, will resume home amlodipine.   3. Multiple myeloma-patient is followed by Dr. Delton Coombes.  I have recommended that she follow-up outpatient as soon as she is discharged. 4. Diabetes mellitus type 2- she is being treated with sliding scale insulin and Tradjenta.  BS have been stable.  5. Essential hypertension-patient was initially hypotensive on arrival which has resolved with IV fluid hydration.  Restart home norvasc.  6. AKI on CKD stage III-creatinine is slightly improved with IV fluid hydration.  Recheck in a.m. 7. Depression and anxiety-continue fluoxetine trazodone and alprazolam.  DVT prophylaxis: Enoxaparin Code Status: Full  Family Communication: daughter/telephone Disposition Plan: inpatient   Consultants:    Procedures:    Antimicrobials:  Ciprofloxacin IV x 1 dose 7/24  Ceftriaxone 7/24>>   Subjective: Pt says she is feeling a lot better. She has difficulty with ambulating and getting out of bed needs assistance.  Objective: Vitals:   01/03/19 2027 01/03/19 2103 01/04/19 0628 01/04/19 0738  BP:  133/81 (!) 142/92   Pulse:  86 77   Resp:  20    Temp:  98.2 F (36.8 C) 98.4 F (36.9 C)   TempSrc:  Oral Oral    SpO2: 93% 96% 95% 97%  Weight:      Height:        Intake/Output Summary (Last 24 hours) at 01/04/2019 0911 Last data filed at 01/04/2019 0703 Gross per 24 hour  Intake 1427.83 ml  Output 3850 ml  Net -2422.17 ml   Filed Weights   01/02/19 1431  Weight: 93 kg   REVIEW OF SYSTEMS  As per history otherwise all reviewed and reported negative  Exam:  General exam: awake, alert, NAD, cooperative.  Respiratory system: Clear. No increased work of breathing. Cardiovascular system: S1 & S2 heard. No JVD, murmurs, gallops, clicks or pedal edema. Gastrointestinal system: Abdomen is nondistended, soft and nontender. Normal bowel sounds heard. Central nervous system: Alert and oriented. No focal neurological deficits. Extremities: no CCE.  Data Reviewed: Basic Metabolic Panel: Recent Labs  Lab 01/02/19 1537 01/03/19 0610  NA 133* 137  K 3.8 4.4  CL 102 107  CO2 23 24  GLUCOSE 160* 106*  BUN 19 14  CREATININE 2.22* 1.61*  CALCIUM 9.2 8.7*   Liver Function Tests: No results for input(s): AST, ALT, ALKPHOS, BILITOT, PROT, ALBUMIN in the last 168 hours. No results for input(s): LIPASE, AMYLASE in the last 168 hours. No results for input(s): AMMONIA in the last 168 hours. CBC: Recent Labs  Lab 01/02/19 1537 01/03/19 0610  WBC 6.0 6.0  NEUTROABS 3.6  --   HGB 12.4 11.5*  HCT 38.7 36.7  MCV 98.7 99.5  PLT 233 202   Cardiac Enzymes: No results for input(s): CKTOTAL, CKMB, CKMBINDEX, TROPONINI in the last  168 hours. CBG (last 3)  Recent Labs    01/03/19 1135 01/03/19 1623 01/04/19 0721  GLUCAP 98 77 100*   Recent Results (from the past 240 hour(s))  SARS Coronavirus 2 (CEPHEID- Performed in [place] hospital lab), Hosp Order     Status: None   Collection Time: 01/02/19  4:16 PM   Specimen: Nasopharyngeal Swab  Result Value Ref Range Status   SARS Coronavirus 2 NEGATIVE NEGATIVE Final    Comment: (NOTE) If result is NEGATIVE SARS-CoV-2 target nucleic acids are  NOT DETECTED. The SARS-CoV-2 RNA is generally detectable in upper and lower  respiratory specimens during the acute phase of infection. The lowest  concentration of SARS-CoV-2 viral copies this assay can detect is 250  copies / mL. A negative result does not preclude SARS-CoV-2 infection  and should not be used as the sole basis for treatment or other  patient management decisions.  A negative result may occur with  improper specimen collection / handling, submission of specimen other  than nasopharyngeal swab, presence of viral mutation(s) within the  areas targeted by this assay, and inadequate number of viral copies  (<250 copies / mL). A negative result must be combined with clinical  observations, patient history, and epidemiological information. If result is POSITIVE SARS-CoV-2 target nucleic acids are DETECTED. The SARS-CoV-2 RNA is generally detectable in upper and lower  respiratory specimens dur ing the acute phase of infection.  Positive  results are indicative of active infection with SARS-CoV-2.  Clinical  correlation with patient history and other diagnostic information is  necessary to determine patient infection status.  Positive results do  not rule out bacterial infection or co-infection with other viruses. If result is PRESUMPTIVE POSTIVE SARS-CoV-2 nucleic acids MAY BE PRESENT.   A presumptive positive result was obtained on the submitted specimen  and confirmed on repeat testing.  While 2019 novel coronavirus  (SARS-CoV-2) nucleic acids may be present in the submitted sample  additional confirmatory testing may be necessary for epidemiological  and / or clinical management purposes  to differentiate between  SARS-CoV-2 and other Sarbecovirus currently known to infect humans.  If clinically indicated additional testing with an alternate test  methodology [phone]) is advised. The SARS-CoV-2 RNA is generally  detectable in upper and lower respiratory sp ecimens  during the acute  phase of infection. The expected result is Negative. Fact Sheet for Patients:  StrictlyIdeas.no Fact Sheet for Healthcare Providers: BankingDealers.co.za This test is not yet approved or cleared by the [place] FDA and has been authorized for detection and/or diagnosis of SARS-CoV-2 by FDA under an Emergency Use Authorization (EUA).  This EUA will remain in effect (meaning this test can be used) for the duration of the COVID-19 declaration under Section 564(b)(1) of the Act, 21 U.S.C. section 360bbb-3(b)(1), unless the authorization is terminated or revoked sooner. Performed at [facility], [address] [place], [place] [address]   Urine Culture     Status: None   Collection Time: 01/02/19  5:00 PM   Specimen: Urine, Clean Catch  Result Value Ref Range Status   Specimen Description   Final    URINE, CLEAN CATCH Performed at [facility], [address], [place], [place] [address]    Special Requests   Final    Normal Performed at [facility], [address] [place], [place] [address]    Culture   Final    NO GROWTH Performed at [facility], [place],  [place] [address]    Report Status 01/04/2019 FINAL  Final  Blood culture (routine x 2)     Status: None (Preliminary result)   Collection Time: 01/02/19  6:41 PM   Specimen: BLOOD RIGHT HAND  Result Value Ref Range Status   Specimen Description BLOOD RIGHT HAND  Final   Special Requests   Final    BOTTLES DRAWN AEROBIC AND ANAEROBIC Blood Culture adequate volume   Culture   Final    NO GROWTH 2 DAYS Performed at [facility], [address] [place], [place] [address]    Report Status PENDING  Incomplete  Blood culture (routine x 2)     Status: None (Preliminary result)   Collection Time: 01/02/19  6:49 PM   Specimen: BLOOD LEFT ARM  Result Value Ref Range Status   Specimen Description BLOOD LEFT ARM  Final   Special Requests    Final    BOTTLES DRAWN AEROBIC AND ANAEROBIC Blood Culture adequate volume   Culture   Final    NO GROWTH 2 DAYS Performed at [facility], [address] [place], [place] [address]    Report Status PENDING  Incomplete     Studies: Dg Chest Portable 1 View  Result Date: 01/02/2019 CLINICAL DATA:  Weakness, history myeloma, diabetes mellitus, hypertension EXAM: PORTABLE CHEST 1 VIEW COMPARISON:  Portable exam 1553 hours compared to 07/16/2018 FINDINGS: Normal heart size, mediastinal contours, and pulmonary vascularity. Chronic elevation of RIGHT diaphragm with RIGHT basilar atelectasis. Remaining lungs clear. No infiltrate, pleural effusion or pneumothorax. No acute osseous findings. IMPRESSION: Chronic elevation of RIGHT diaphragm and RIGHT basilar atelectasis. No new abnormalities. Electronically Signed   By: Lavonia Dana M.D.   On: 01/02/2019 17:30   Scheduled Meds: . ALPRAZolam  0.5 mg Oral QHS  . amLODipine  10 mg Oral Daily  . aspirin EC  325 mg Oral Daily  . enoxaparin (LOVENOX) injection  40 mg Subcutaneous Q24H  . insulin aspart  0-9 Units Subcutaneous TID WC  . linagliptin  5 mg Oral Daily  . mometasone-formoterol  2 puff Inhalation BID  . pantoprazole  40 mg Oral BID  . PARoxetine  40 mg Oral q morning - 10a  . pravastatin  40 mg Oral q1800  . traZODone  100 mg Oral QHS   Continuous Infusions: . 0.9 % NaCl with KCl 20 mEq / L Stopped (01/03/19 2057)  . cefTRIAXone (ROCEPHIN)  IV 1 g (01/03/19 2026)    Active Problems:   Type 2 diabetes with nephropathy (HCC)   Hyperlipidemia LDL goal <100   Hypertension goal BP (blood pressure) < 130/80   GERD   CKD (chronic kidney disease) stage 3, GFR 30-59 ml/min (HCC)   Urinary frequency   Sepsis ([place])   Urinary tract infection with hematuria  Time spent:   Irwin Brakeman, MD [facility] 01/04/2019, 9:11 AM    LOS: 2 days  How to contact the [facility] Attending or Consulting provider [place] or covering provider during  after hours [place], for this patient?  1. Check the care team in [facility] and look for a) attending/consulting TRH provider listed and b) the [facility] team listed 2. Log into [email or web address] and use [place]'s universal password to access. If you do not have the password, please contact the hospital operator. 3. Locate the [facility] provider you are looking for under [facility] and page to a number that you can be directly reached. 4. If you still have difficulty reaching the  provider, please page the [facility] (Director on Call) for the Hospitalists listed on amion for assistance.

## 2019-01-05 DIAGNOSIS — C9000 Multiple myeloma not having achieved remission: Secondary | ICD-10-CM

## 2019-01-05 LAB — BASIC METABOLIC PANEL
Anion gap: 9 (ref 5–15)
BUN: 11 mg/dL (ref 8–23)
CO2: 23 mmol/L (ref 22–32)
Calcium: 9.7 mg/dL (ref 8.9–10.3)
Chloride: 106 mmol/L (ref 98–111)
Creatinine, Ser: 1.47 mg/dL — ABNORMAL HIGH (ref 0.44–1.00)
GFR calc Af Amer: 40 mL/min — ABNORMAL LOW (ref >60)
GFR calc non Af Amer: 35 mL/min — ABNORMAL LOW (ref >60)
Glucose, Bld: 104 mg/dL — ABNORMAL HIGH (ref 70–99)
Potassium: 3.8 mmol/L (ref 3.5–5.1)
Sodium: 138 mmol/L (ref 135–145)

## 2019-01-05 MED ORDER — FUROSEMIDE 40 MG PO TABS: 40.0000 mg | ORAL_TABLET | Freq: Every day | ORAL | Status: DC | PRN

## 2019-01-05 NOTE — Evaluation (Signed)
Physical Therapy Evaluation Patient Details Name: Cheryl Abbott MRN: [id number] DOB: [birth year] Today's Date: 01/05/2019   History of Present Illness  Cheryl Abbott is a 73 y.o. female with medical history significant for diabetes mellitus, hypertension, CKD 3, multiple myeloma, CVA, anxiety and depression, to the ED with complaints of generalized weakness for several days to weeks.  Patient reports chronic poor p.o. intake, she denies vomiting no loose stools, no abdominal pain.  No pain with urination.  She reports urinary frequency mostly at night, for a while now she is unable to tell me how long.  She reports a mild cough that is mostly dry, for about 2 weeks duration.  Patient denies fever or chills.    Clinical Impression  Patient functioning near baseline for functional mobility and gait, slightly unsteady cadence during ambulation without loss of balance, limited secondary to c/o fatigue and tolerated sitting up in chair after therapy.  Patient to be discharged home today.  Plan:  Patient discharged from physical therapy to care of nursing for ambulation daily as tolerated for length of stay.     Follow Up Recommendations Home health PT;Supervision - Intermittent    Equipment Recommendations  None recommended by PT    Recommendations for Other Services       Precautions / Restrictions Precautions Precautions: Fall Restrictions Weight Bearing Restrictions: No      Mobility  Bed Mobility Overal bed mobility: Modified Independent             General bed mobility comments: increased time  Transfers Overall transfer level: Modified independent Equipment used: Straight cane             General transfer comment: slightly increased time  Ambulation/Gait Ambulation/Gait assistance: Supervision Gait Distance (Feet): 65 Feet Assistive device: Straight cane Gait Pattern/deviations: Decreased step length - right;Decreased step length - left;Decreased stride  length;Step-to pattern Gait velocity: decreased   General Gait Details: slightly labored cadence with mostly step-to pattern without loss of balance using SPC  Stairs            Wheelchair Mobility    Modified Rankin (Stroke Patients Only)       Balance Overall balance assessment: Needs assistance Sitting-balance support: Feet supported;No upper extremity supported Sitting balance-Leahy Scale: Good     Standing balance support: During functional activity;Single extremity supported Standing balance-Leahy Scale: Fair Standing balance comment: fair/good using SPC                             Pertinent Vitals/Pain Pain Assessment: No/denies pain    Home Living Family/patient expects to be discharged to:: Private residence Living Arrangements: Alone Available Help at Discharge: Family Type of Home: Mobile home Home Access: Stairs to enter Entrance Stairs-Rails: Right;Left;Can reach both Entrance Stairs-Number of Steps: 5 Home Layout: One level Home Equipment: Walker - 2 wheels;Cane - single point;Shower seat;Bedside commode;Cane - quad      Prior Function Level of Independence: Independent with assistive device(s)         Comments: Household and short distanced community ambulator using SPC     Hand Dominance   Dominant Hand: Right    Extremity/Trunk Assessment   Upper Extremity Assessment Upper Extremity Assessment: Overall WFL for tasks assessed    Lower Extremity Assessment Lower Extremity Assessment: Generalized weakness    Cervical / Trunk Assessment Cervical / Trunk Assessment: Normal  Communication   Communication: No difficulties  Cognition Arousal/Alertness: Awake/alert Behavior During  Therapy: WFL for tasks assessed/performed Overall Cognitive Status: Within Functional Limits for tasks assessed                                        General Comments      Exercises     Assessment/Plan    PT Assessment  All further PT needs can be met in the next venue of care  PT Problem List Decreased strength;Decreased activity tolerance;Decreased mobility;Decreased balance       PT Treatment Interventions      PT Goals (Current goals can be found in the Care Plan section)  Acute Rehab PT Goals Patient Stated Goal: return home PT Goal Formulation: With patient Time For Goal Achievement: 01/05/19 Potential to Achieve Goals: Good    Frequency     Barriers to discharge        Co-evaluation               AM-PAC PT "6 Clicks" Mobility  Outcome Measure Help needed turning from your back to your side while in a flat bed without using bedrails?: None Help needed moving from lying on your back to sitting on the side of a flat bed without using bedrails?: None Help needed moving to and from a bed to a chair (including a wheelchair)?: None Help needed standing up from a chair using your arms (e.g., wheelchair or bedside chair)?: None Help needed to walk in hospital room?: A Little Help needed climbing 3-5 steps with a railing? : A Little 6 Click Score: 22    End of Session   Activity Tolerance: Patient tolerated treatment well;Patient limited by fatigue Patient left: in chair;with call bell/phone within reach Nurse Communication: Mobility status PT Visit Diagnosis: Unsteadiness on feet (R26.81);Other abnormalities of gait and mobility (R26.89);Muscle weakness (generalized) (M62.81)    Time: 6859-9234 PT Time Calculation (min) (ACUTE ONLY): 26 min   Charges:   PT Evaluation $PT Eval Moderate Complexity: 1 Mod PT Treatments $Therapeutic Activity: 23-37 mins        2:10 PM, 01/05/19 Lonell Grandchild, MPT Physical Therapist with [facility] [place] [address] [phone] office [phone] mobile phone

## 2019-01-05 NOTE — TOC Transition Note (Signed)
Transition of Care [facility]) - CM/SW Discharge Note   Patient Details  Name: Cheryl Abbott MRN: [id number] Date of Birth: [birth year]  [facility]) CM/SW Contact:  Rielly Brunn, Chauncey Reading, RN Phone Number: 01/05/2019, 10:37 AM   Clinical Narrative:   Patient discharging home today.  Recommended for home health PT, agreeable, has had [facility] in the past, would like again.  Has cane, uses prn. Still drives unless feeling ill, then her cousin [name] drives her. Uses inhalers. No other DME.     Final next level of care: [place] Barriers to Discharge: No Barriers Identified   Patient Goals and CMS Choice Patient states their goals for this hospitalization and ongoing recovery are:: feet better and get home CMS Medicare.gov Compare Post Acute Care list provided to:: Patient Choice offered to / list presented to : Patient   Discharge Plan and Services     Post Acute Care Choice: [place] Agency: [place] ([facility]) Date [place]: 01/05/19 Time [place]: [place] Representative spoke with at [place]: [place] Determinants of Health ([place]) Interventions     Readmission Risk Interventions No flowsheet data found.

## 2019-01-05 NOTE — Progress Notes (Signed)
IV removed, D/C instructions reviewed with patient. Verbalized understanding. Transported to private vehicle via wheelchair.

## 2019-01-05 NOTE — Care Management (Signed)
Print all results - Opens in a new window  Home health results Skip to 'Modify Your Results' navigation   results list table  [place] InformationSorted ascending, Select to sort descending  Quality of Patient [place] to sort ascending or descending . A rating of 4 or more stars means the agency performed better than most other agencies on selected measures. . A rating of 3 to 3 stars means that the agency performed about the same as most agencies. . A rating of fewer than 3 stars means that the agency's performance was below the average of other agencies on selected measures." class="info" href="javascript:void(0)". A rating of 4 or more stars means the agency performed better than most other agencies on selected measures. . A rating of 3 to 3 stars means that the agency performed about the same as most agencies. . A rating of fewer than 3 stars means that the agency's performance was below the average of other agencies on selected measures." class="info" href="javascript:void(0)"  Patient Survey Summary RatingSelect to sort ascending or descending    [facility] [phone]   [place] to my Favorites  Quality of Patient Care Rating4 out of 5 stars Patient Survey Summary Rating4 out of [place] [phone]   [place] to my Favorites  Quality of Patient Care Rating3 out of 5 stars Patient Survey Summary Rating5 out of [place] [phone]   [place] to my Favorites  Quality of Patient Care Rating3 out of 5 stars Patient Survey Summary Rating4 out of [place] [phone]   [place] to my [place]  out of 5 stars Patient Survey Summary Rating4 out of [place] [phone]   [place] to my [place]  out of 5 stars  Patient Survey Summary Rating3 out of [place] [phone]   [place] to my Favorites  Quality of Patient Care Rating4 out of 5 stars Patient Survey Summary Rating4 out of [place] [phone]   [place] to my Favorites  Quality of Patient Care Rating4 out of 5 stars Patient Survey Summary Rating4 out of [place] [phone]   [place] to my Favorites  Quality of Patient Care Rating4 out of 5 stars Patient Survey Summary Rating4 out of [place] AGE [phone]   [place] AGE to my Favorites  Quality of Patient Care Rating3 out of 5 stars Patient Survey Summary Rating3 out of 5 stars  [facility] [place] [phone]   Add [facility] [place] to my Favorites  Quality of Patient Care Rating3  out of 5 stars Patient Survey Summary Rating4 out of [place] [phone]   [place] to my Favorites  Quality of Patient Care Rating3 out of 5 stars Patient Survey Summary Rating4 out of 5 stars  [facility] (336) [phone]   Add [facility] to my Favorites  Quality of Patient Care  Rating3  out of 5 stars Patient Survey Summary Rating3 out of [place] [phone]   Add [facility] to my Favorites  Quality of Patient Care Rating3  out of 5 stars Not [place] (336) [place] to my Favorites  Quality of Patient Care Rating4  out of 5 stars Patient Survey Summary Rating3 out of 5 stars  Viewing 1 - 14 of 14 results       Choose up to 3 home health agencies to compare. So far you have none selected.  Compare now  Modify your search   To change any of the search criteria in this panel and see the results, you must select the update results button after making your changes  Location  Home health agencies that serve: ZIP code or [place] nameAgency name  Update search results  Filter by:  Clear all filters  Quality of patient care rating Learn more about these ratings - Opens in a new window  5 stars (0)  4 ? 4.5 stars (6)  3 ? 3.5 stars (8)  2 ? 2.5 stars (0)  1 ? 1.5 stars (0)  Patient survey summary ratings Learn more about these ratings - Opens in a new window   Rating: 5 out of 5 (1)   Rating: 4 out of 5 (8)   Rating: 3 out of 5 (4)   Rating: 2 out of 5 (0)   Rating: 1 out of 5 (0)  Want to start a new search?  Start new search  If footnotes appear in the table, hover over the number to get more details. View more footnote details.  Back to top  Previous [place] website managed and paid for by the [place] [employer] for [employer] & Medicaid Services.   Sign Up / Change Plans  Your Medicare Costs  What Medicare Covers  Drug Coverage (Part D)  [place]  Take Action Find health & drug plans  Find doctors, providers, hospitals & plans  Where can I get covered medical items?  Get Medicare forms  Publications  Information in other languages  Phone numbers & websites  Helpful Links Site Map  Site policies & important links  Public affairs consultant settings  Accessibility / Nondiscrimination  [place]IA  No Fear Act  [place].Acupuncturist databases  "Medicare & You" Handbook  Help with file formats & plug-ins  CMS & HHS Websites PodExchange.nl  CriticalZ.it  MyMedicare.gov  Medicaid.gov  CMS.gov  HHS.gov  Get Involved with [place] Twitter link for Medicare.gov  twitter account opens a new tab  Youtube link for Medicare.gov Youtube channel opens a new tab  RSS link for Medicare.gov RSS feed  Sign up for email updates about Medicare

## 2019-01-05 NOTE — Care Management Important Message (Signed)
Important Message  Patient Details  Name: Cheryl Abbott MRN: [id number] Date of Birth: [birth year]   Medicare Important Message Given:  Yes     Danna Casella, Chauncey Reading, RN 01/05/2019, 10:31 AM

## 2019-01-05 NOTE — Discharge Summary (Addendum)
Physician Discharge Summary  NIHAL DOAN YDX:[id number] DOB: January 02, 1946 DOA: 01/02/2019  PCP: [name], MD Oncology: Delton Coombes Orthopedics: Aline Brochure  Admit date: 01/02/2019 Discharge date: 01/05/2019  Admitted From: Home Disposition: Home  Recommendations for Outpatient Follow-up:  1. Follow up with PCP in 1 weeks 2. Please obtain BMP in one week to follow-up on electrolytes, particularly creatinine and potassium 3. Follow-up with Dr. Delton Coombes in 1-2 weeks  Home Health: Physical therapy arranged  Discharge Condition: Stable CODE STATUS: Full  Brief Hospitalization Summary: Please see all hospital notes, images, labs for full details of the hospitalization. Dr. Talmadge Coventry HPI: DAMA HEDGEPETH is a 73 y.o. female with medical history significant for diabetes mellitus, hypertension, CKD 3, multiple myeloma, CVA, anxiety and depression, to the ED with complaints of generalized weakness for several days to weeks.  Patient reports chronic poor p.o. intake, she denies vomiting no loose stools, no abdominal pain.  No pain with urination.  She reports urinary frequency mostly at night, for a while now she is unable to tell me how long.  She reports a mild cough that is mostly dry, for about 2 weeks duration.  Patient denies fever or chills.  ED Course: Patient 97.9, heart rate 50s to 60s, blood pressure initially 112/62 dropped to 77/39 in the ED, and improved with 2 L bolus normal saline.  O2 sats greater than 93% on room air.  WBC 6.  Normal lactic acid 1.3.  High-sensitivity troponin 4 x 2.  UA with large leukocytes few bacteria greater than 50 WBCs.  Portable chest x-ray shows elevation of the right diaphragm, right base atelectasis.  EKG sinus rhythm without significant abnormalities.  Patient was started on IV ciprofloxacin, patient has a penicillin allergy. Hospitalist to admit for UTI and generalized weakness.    Brief Admission Hx: 73 y.o.femalewith medical history  significant fordiabetes mellitus, hypertension, CKD 3, multiple myeloma, CVA, anxiety and depression,to the ED with complaints of generalized weakness for several days to weeks.  MDM/Assessment & Plan:   1. Sepsis secondary to UTI-RESOLVED.  Patient arrived with tachypnea and tachycardia and hypotension.  Patient has responded well to IV antibiotics.  BP has normalized.    Urine cultures with no significant growth.  Blood cultures have been no growth to date.  Patient was treated with ceftriaxone IV and supportive therapy. 2. Hypotension -resolved now.  BP seems to have normalized and now rising, will resume home amlodipine.   3. Multiple myeloma-patient is followed by Dr. Delton Coombes.  I have recommended that she follow-up outpatient as soon as she is discharged. 4. Diabetes mellitus type 2- she is being treated with sliding scale insulin and Tradjenta.  BS have been stable.  5. Essential hypertension-patient was initially hypotensive on arrival which has resolved with IV fluid hydration.  Restart home norvasc.  6. AKI on CKD stage III-creatinine is slightly improved with IV fluid hydration.    Recheck BMP in 1 week with primary care provider. 7. Depression and anxiety- resume home medications.  DVT prophylaxis: Enoxaparin Code Status: Full  Family Communication: daughter/telephone Disposition Plan: inpatient   Discharge Diagnoses:  Active Problems:   Type 2 diabetes with nephropathy (HCC)   Hyperlipidemia LDL goal <100   Hypertension goal BP (blood pressure) < 130/80   GERD   CKD (chronic kidney disease) stage 3, GFR 30-59 ml/min (HCC)   Urinary frequency   Sepsis ([place])   Urinary tract infection with hematuria   Discharge Instructions:  Allergies as of 01/05/2019  Reactions   Ace Inhibitors Cough   Keflex [cephalexin] Diarrhea, Nausea And Vomiting   Nitrofurantoin Diarrhea, Nausea And Vomiting   Penicillins Hives, Other (See Comments)   Reaction:  Blisters on hands/feet   Has patient had a PCN reaction causing immediate rash, facial/tongue/throat swelling, SOB or lightheadedness with hypotension: No Has patient had a PCN reaction causing severe rash involving mucus membranes or skin necrosis: No Has patient had a PCN reaction that required hospitalization No Has patient had a PCN reaction occurring within the last 10 years: No If all of the above answers are "NO", then may proceed with Cephalosporin use.      Medication List    STOP taking these medications   diphenhydramine-acetaminophen 25-500 MG Tabs tablet Commonly known as: TYLENOL PM     TAKE these medications   ALPRAZolam 0.5 MG tablet Commonly known as: XANAX Take one tablet at bedtime for sleep and anxiety, by mouth What changed:   how much to take  how to take this  when to take this  additional instructions   amLODipine 10 MG tablet Commonly known as: NORVASC TAKE 1 TABLET BY MOUTH EVERY DAY   aspirin EC 325 MG tablet Take 325 mg by mouth daily.   calcium-vitamin D 500-200 MG-UNIT tablet Commonly known as: OSCAL WITH D Take 1 tablet by mouth daily.   furosemide 40 MG tablet Commonly known as: LASIX Take 1 tablet (40 mg total) by mouth daily as needed for fluid. Start taking on: January 10, 2019 What changed:   how much to take  how to take this  when to take this  reasons to take this  additional instructions  These instructions start on January 10, 2019. If you are unsure what to do until then, ask your doctor or other care provider.   lovastatin 40 MG tablet Commonly known as: MEVACOR TAKE 1 TABLET BY MOUTH EVERYDAY AT BEDTIME What changed: See the new instructions.   Melatonin 10 MG Tabs Take 10 mg by mouth at bedtime.   pantoprazole 40 MG tablet Commonly known as: PROTONIX TAKE 1 TABLET (40 MG TOTAL) BY MOUTH 2 (TWO) TIMES DAILY.   PARoxetine 40 MG tablet Commonly known as: PAXIL TAKE 1 TABLET BY MOUTH EVERY DAY IN THE MORNING What changed: See the  new instructions.   potassium chloride 10 MEQ tablet Commonly known as: K-DUR Take 1 tablet (10 mEq total) by mouth daily.   Symbicort 80-4.5 MCG/ACT inhaler Generic drug: budesonide-formoterol INHALE 2 PUFFS INTO THE LUNGS TWICE A DAY   tiZANidine 4 MG tablet Commonly known as: Zanaflex Take 1 tablet (4 mg total) by mouth every 6 (six) hours as needed for muscle spasms.   Tradjenta 5 MG Tabs tablet Generic drug: linagliptin TAKE 1 TABLET BY MOUTH EVERY DAY   traZODone 100 MG tablet Commonly known as: DESYREL Take 100 mg by mouth at bedtime.      Follow-up Information    [name], MD. Schedule an appointment as soon as possible for a visit in 1 week(s).   Specialty: Family Medicine Contact information: [address] [place] [address] [phone]        Arnoldo Lenis, MD. Schedule an appointment as soon as possible for a visit in 2 week(s).   Specialty: Cardiology Contact information: [place] [address] [phone]        Derek Jack, MD. Schedule an appointment as soon as possible for a visit in  1 week(s).   Specialty: Hematology Why: Hospital follow-up Contact information: [place] [address] [phone]        Perlie Mayo, NP. Schedule an appointment as soon as possible for a visit in 1 week(s).   Specialty: Family Medicine Why: Hospital Follow Up  Contact information: [place] [address] [phone]        Carole Civil, MD. Schedule an appointment as soon as possible for a visit in 2 week(s).   Specialties: Orthopedic Surgery, Radiology Contact information: [address] [place] [address] [phone]          Allergies  Allergen Reactions  . Ace Inhibitors Cough  . Keflex [Cephalexin] Diarrhea and Nausea And Vomiting  . Nitrofurantoin Diarrhea and Nausea And Vomiting  . Penicillins Hives and Other (See Comments)     Reaction:  Blisters on hands/feet  Has patient had a PCN reaction causing immediate rash, facial/tongue/throat swelling, SOB or lightheadedness with hypotension: No Has patient had a PCN reaction causing severe rash involving mucus membranes or skin necrosis: No Has patient had a PCN reaction that required hospitalization No Has patient had a PCN reaction occurring within the last 10 years: No If all of the above answers are "NO", then may proceed with Cephalosporin use.   Allergies as of 01/05/2019      Reactions   Ace Inhibitors Cough   Keflex [cephalexin] Diarrhea, Nausea And Vomiting   Nitrofurantoin Diarrhea, Nausea And Vomiting   Penicillins Hives, Other (See Comments)   Reaction:  Blisters on hands/feet  Has patient had a PCN reaction causing immediate rash, facial/tongue/throat swelling, SOB or lightheadedness with hypotension: No Has patient had a PCN reaction causing severe rash involving mucus membranes or skin necrosis: No Has patient had a PCN reaction that required hospitalization No Has patient had a PCN reaction occurring within the last 10 years: No If all of the above answers are "NO", then may proceed with Cephalosporin use.      Medication List    STOP taking these medications   diphenhydramine-acetaminophen 25-500 MG Tabs tablet Commonly known as: TYLENOL PM     TAKE these medications   ALPRAZolam 0.5 MG tablet Commonly known as: XANAX Take one tablet at bedtime for sleep and anxiety, by mouth What changed:   how much to take  how to take this  when to take this  additional instructions   amLODipine 10 MG tablet Commonly known as: NORVASC TAKE 1 TABLET BY MOUTH EVERY DAY   aspirin EC 325 MG tablet Take 325 mg by mouth daily.   calcium-vitamin D 500-200 MG-UNIT tablet Commonly known as: OSCAL WITH D Take 1 tablet by mouth daily.   furosemide 40 MG tablet Commonly known as: LASIX Take 1 tablet (40 mg total) by mouth daily as needed for  fluid. Start taking on: January 10, 2019 What changed:   how much to take  how to take this  when to take this  reasons to take this  additional instructions  These instructions start on January 10, 2019. If you are unsure what to do until then, ask your doctor or other care provider.   lovastatin 40 MG tablet Commonly known as: MEVACOR TAKE 1 TABLET BY MOUTH EVERYDAY AT BEDTIME What changed: See the new instructions.   Melatonin 10 MG Tabs Take 10 mg by mouth at bedtime.   pantoprazole 40 MG tablet Commonly known as: PROTONIX TAKE 1 TABLET (40 MG TOTAL)  BY MOUTH 2 (TWO) TIMES DAILY.   PARoxetine 40 MG tablet Commonly known as: PAXIL TAKE 1 TABLET BY MOUTH EVERY DAY IN THE MORNING What changed: See the new instructions.   potassium chloride 10 MEQ tablet Commonly known as: K-DUR Take 1 tablet (10 mEq total) by mouth daily.   Symbicort 80-4.5 MCG/ACT inhaler Generic drug: budesonide-formoterol INHALE 2 PUFFS INTO THE LUNGS TWICE A DAY   tiZANidine 4 MG tablet Commonly known as: Zanaflex Take 1 tablet (4 mg total) by mouth every 6 (six) hours as needed for muscle spasms.   Tradjenta 5 MG Tabs tablet Generic drug: linagliptin TAKE 1 TABLET BY MOUTH EVERY DAY   traZODone 100 MG tablet Commonly known as: DESYREL Take 100 mg by mouth at bedtime.       Procedures/Studies: Dg Cervical Spine 2 Or 3 Views  Result Date: 12/31/2018 [facility] Radiology report Dictated by Dr. Aline Brochure Chief complaint  Pain neck and left shoulder Mid cervical osteophytes and joint space narrowing on AP x-ray increased cervical lordosis Impression cervical spondylosis moderate   Dg Chest Portable 1 View  Result Date: 01/02/2019 CLINICAL DATA:  Weakness, history myeloma, diabetes mellitus, hypertension EXAM: PORTABLE CHEST 1 VIEW COMPARISON:  Portable exam 1553 hours compared to 07/16/2018 FINDINGS: Normal heart size, mediastinal contours, and pulmonary vascularity.  Chronic elevation of RIGHT diaphragm with RIGHT basilar atelectasis. Remaining lungs clear. No infiltrate, pleural effusion or pneumothorax. No acute osseous findings. IMPRESSION: Chronic elevation of RIGHT diaphragm and RIGHT basilar atelectasis. No new abnormalities. Electronically Signed   By: Lavonia Dana M.D.   On: 01/02/2019 17:30     Subjective: Patient says she is feeling much better today.  She says that she wants to go home.  She says that she is eating and drinking well.  She is still weak but overall has improved.  She would like to get home health PT as she has had this in the past and that has helped.  The patient denies nausea or vomiting.  Patient denies shortness of breath and chest pain.  Patient says she is planning to follow-up with Dr. Moshe Cipro.  Discharge Exam: Vitals:   01/05/19 0602 01/05/19 0804  BP: (!) 130/98   Pulse: 94   Resp: 18   Temp: 98.4 F (36.9 C)   SpO2: 94% (!) 89%   Vitals:   01/04/19 1300 01/04/19 2053 01/05/19 0602 01/05/19 0804  BP: 133/84 127/81 (!) 130/98   Pulse: 82 90 94   Resp: _0 Temp: 98.2 F (36.8 C) 98.6 F (37 C) 98.4 F (36.9 C)   TempSrc: Oral Oral Oral   SpO2: 95% 93% 94% (!) 89%  Weight:      Height:       General exam: awake, alert, NAD, cooperative.  Respiratory system: Clear. No increased work of breathing. Cardiovascular system: S1 & S2 heard. No JVD, murmurs, gallops, clicks or pedal edema. Gastrointestinal system: Abdomen is nondistended, soft and nontender. Normal bowel sounds heard. Central nervous system: Alert and oriented. No focal neurological deficits. Extremities: no CCE.  The results of significant diagnostics from this hospitalization (including imaging, microbiology, ancillary and laboratory) are listed below for reference.     Microbiology: Recent Results (from the past 240 hour(s))  SARS Coronavirus 2 (CEPHEID- Performed in [place] hospital lab), Hosp Order     Status: None   Collection  Time: 01/02/19  4:16 PM   Specimen: Nasopharyngeal Swab  Result Value Ref Range Status  SARS Coronavirus 2 NEGATIVE NEGATIVE Final    Comment: (NOTE) If result is NEGATIVE SARS-CoV-2 target nucleic acids are NOT DETECTED. The SARS-CoV-2 RNA is generally detectable in upper and lower  respiratory specimens during the acute phase of infection. The lowest  concentration of SARS-CoV-2 viral copies this assay can detect is 250  copies / mL. A negative result does not preclude SARS-CoV-2 infection  and should not be used as the sole basis for treatment or other  patient management decisions.  A negative result may occur with  improper specimen collection / handling, submission of specimen other  than nasopharyngeal swab, presence of viral mutation(s) within the  areas targeted by this assay, and inadequate number of viral copies  (<250 copies / mL). A negative result must be combined with clinical  observations, patient history, and epidemiological information. If result is POSITIVE SARS-CoV-2 target nucleic acids are DETECTED. The SARS-CoV-2 RNA is generally detectable in upper and lower  respiratory specimens dur ing the acute phase of infection.  Positive  results are indicative of active infection with SARS-CoV-2.  Clinical  correlation with patient history and other diagnostic information is  necessary to determine patient infection status.  Positive results do  not rule out bacterial infection or co-infection with other viruses. If result is PRESUMPTIVE POSTIVE SARS-CoV-2 nucleic acids MAY BE PRESENT.   A presumptive positive result was obtained on the submitted specimen  and confirmed on repeat testing.  While 2019 novel coronavirus  (SARS-CoV-2) nucleic acids may be present in the submitted sample  additional confirmatory testing may be necessary for epidemiological  and / or clinical management purposes  to differentiate between  SARS-CoV-2 and other Sarbecovirus currently known  to infect humans.  If clinically indicated additional testing with an alternate test  methodology [phone]) is advised. The SARS-CoV-2 RNA is generally  detectable in upper and lower respiratory sp ecimens during the acute  phase of infection. The expected result is Negative. Fact Sheet for Patients:  StrictlyIdeas.no Fact Sheet for Healthcare Providers: BankingDealers.co.za This test is not yet approved or cleared by the [place] FDA and has been authorized for detection and/or diagnosis of SARS-CoV-2 by FDA under an Emergency Use Authorization (EUA).  This EUA will remain in effect (meaning this test can be used) for the duration of the COVID-19 declaration under Section 564(b)(1) of the Act, 21 U.S.C. section 360bbb-3(b)(1), unless the authorization is terminated or revoked sooner. Performed at [facility], [address], [place], [place] [address]   Urine Culture     Status: None   Collection Time: 01/02/19  5:00 PM   Specimen: Urine, Clean Catch  Result Value Ref Range Status   Specimen Description   Final    URINE, CLEAN CATCH Performed at [facility], [address] [place], [place] [address]    Special Requests   Final    Normal Performed at [facility], [address] [place], [place] [address]    Culture   Final    NO GROWTH Performed at [facility], [place] [address], [place], [place] [address]    Report Status 01/04/2019 FINAL  Final  Blood culture (routine x 2)     Status: None (Preliminary result)   Collection Time: 01/02/19  6:41 PM   Specimen: BLOOD RIGHT HAND  Result Value Ref Range Status   Specimen Description BLOOD RIGHT HAND  Final   Special Requests   Final    BOTTLES DRAWN AEROBIC AND ANAEROBIC Blood Culture adequate  volume   Culture   Final    NO GROWTH 3 DAYS Performed at [facility], [address], [place], [place] [address]    Report Status PENDING  Incomplete  Blood culture (routine  x 2)     Status: None (Preliminary result)   Collection Time: 01/02/19  6:49 PM   Specimen: BLOOD LEFT ARM  Result Value Ref Range Status   Specimen Description BLOOD LEFT ARM  Final   Special Requests   Final    BOTTLES DRAWN AEROBIC AND ANAEROBIC Blood Culture adequate volume   Culture   Final    NO GROWTH 3 DAYS Performed at [facility], [address], [place], [place] [address]    Report Status PENDING  Incomplete     Labs: BNP (last 3 results) No results for input(s): BNP in the last 8760 hours. Basic Metabolic Panel: Recent Labs  Lab 01/02/19 1537 01/03/19 0610 01/05/19 0551  NA 133* 137 138  K 3.8 4.4 3.8  CL 102 107 106  CO2 _0 GLUCOSE 160* 106* 104*  BUN _1 CREATININE 2.22* 1.61* 1.47*  CALCIUM 9.2 8.7* 9.7   Liver Function Tests: No results for input(s): AST, ALT, ALKPHOS, BILITOT, PROT, ALBUMIN in the last 168 hours. No results for input(s): LIPASE, AMYLASE in the last 168 hours. No results for input(s): AMMONIA in the last 168 hours. CBC: Recent Labs  Lab 01/02/19 1537 01/03/19 0610  WBC 6.0 6.0  NEUTROABS 3.6  --   HGB 12.4 11.5*  HCT 38.7 36.7  MCV 98.7 99.5  PLT 233 202   Cardiac Enzymes: No results for input(s): CKTOTAL, CKMB, CKMBINDEX, TROPONINI in the last 168 hours. BNP: Invalid input(s): POCBNP CBG: Recent Labs  Lab 01/03/19 0714 01/03/19 1135 01/03/19 1623 01/04/19 0721 01/04/19 1109  GLUCAP 97 98 77 100* 119*   D-Dimer No results for input(s): DDIMER in the last 72 hours. Hgb A1c No results for input(s): HGBA1C in the last 72 hours. Lipid Profile No results for input(s): CHOL, HDL, LDLCALC, TRIG, CHOLHDL, LDLDIRECT in the last 72 hours. Thyroid function studies No results for input(s): TSH, T4TOTAL, T3FREE, THYROIDAB in the last 72 hours.  Invalid input(s): FREET3 Anemia work up No results for input(s): VITAMINB12, FOLATE, FERRITIN, TIBC, IRON, RETICCTPCT in the last 72 hours. Urinalysis    Component  Value Date/Time   COLORURINE YELLOW 01/02/2019 1700   APPEARANCEUR CLOUDY (A) 01/02/2019 1700   LABSPEC 1.018 01/02/2019 1700   PHURINE 5.0 01/02/2019 1700   GLUCOSEU NEGATIVE 01/02/2019 1700   HGBUR LARGE (A) 01/02/2019 1700   HGBUR moderate 02/07/2010 0907   BILIRUBINUR NEGATIVE 01/02/2019 1700   BILIRUBINUR neg 09/06/2015 1401   KETONESUR NEGATIVE 01/02/2019 1700   PROTEINUR 30 (A) 01/02/2019 1700   UROBILINOGEN 0.2 09/06/2015 1401   UROBILINOGEN 0.2 03/01/2015 1600   NITRITE NEGATIVE 01/02/2019 1700   LEUKOCYTESUR LARGE (A) 01/02/2019 1700   Sepsis Labs Invalid input(s): PROCALCITONIN,  WBC,  LACTICIDVEN Microbiology Recent Results (from the past 240 hour(s))  SARS Coronavirus 2 (CEPHEID- Performed in [place] hospital lab), Hosp Order     Status: None   Collection Time: 01/02/19  4:16 PM   Specimen: Nasopharyngeal Swab  Result Value Ref Range Status   SARS Coronavirus 2 NEGATIVE NEGATIVE Final    Comment: (NOTE) If result is NEGATIVE SARS-CoV-2 target nucleic acids are NOT DETECTED. The SARS-CoV-2 RNA is generally detectable in upper and lower  respiratory specimens during the acute phase of  infection. The lowest  concentration of SARS-CoV-2 viral copies this assay can detect is 250  copies / mL. A negative result does not preclude SARS-CoV-2 infection  and should not be used as the sole basis for treatment or other  patient management decisions.  A negative result may occur with  improper specimen collection / handling, submission of specimen other  than nasopharyngeal swab, presence of viral mutation(s) within the  areas targeted by this assay, and inadequate number of viral copies  (<250 copies / mL). A negative result must be combined with clinical  observations, patient history, and epidemiological information. If result is POSITIVE SARS-CoV-2 target nucleic acids are DETECTED. The SARS-CoV-2 RNA is generally detectable in upper and lower  respiratory specimens  dur ing the acute phase of infection.  Positive  results are indicative of active infection with SARS-CoV-2.  Clinical  correlation with patient history and other diagnostic information is  necessary to determine patient infection status.  Positive results do  not rule out bacterial infection or co-infection with other viruses. If result is PRESUMPTIVE POSTIVE SARS-CoV-2 nucleic acids MAY BE PRESENT.   A presumptive positive result was obtained on the submitted specimen  and confirmed on repeat testing.  While 2019 novel coronavirus  (SARS-CoV-2) nucleic acids may be present in the submitted sample  additional confirmatory testing may be necessary for epidemiological  and / or clinical management purposes  to differentiate between  SARS-CoV-2 and other Sarbecovirus currently known to infect humans.  If clinically indicated additional testing with an alternate test  methodology [phone]) is advised. The SARS-CoV-2 RNA is generally  detectable in upper and lower respiratory sp ecimens during the acute  phase of infection. The expected result is Negative. Fact Sheet for Patients:  StrictlyIdeas.no Fact Sheet for Healthcare Providers: BankingDealers.co.za This test is not yet approved or cleared by the [place] FDA and has been authorized for detection and/or diagnosis of SARS-CoV-2 by FDA under an Emergency Use Authorization (EUA).  This EUA will remain in effect (meaning this test can be used) for the duration of the COVID-19 declaration under Section 564(b)(1) of the Act, 21 U.S.C. section 360bbb-3(b)(1), unless the authorization is terminated or revoked sooner. Performed at [facility], [address], [place], [place] [address]   Urine Culture     Status: None   Collection Time: 01/02/19  5:00 PM   Specimen: Urine, Clean Catch  Result Value Ref Range Status   Specimen Description   Final    URINE, CLEAN CATCH Performed at [facility], [address], [place], [place] [address]    Special Requests   Final    Normal Performed at [facility], [address], [place], [place] [address]    Culture   Final    NO GROWTH Performed at [facility], [place] [address] [place], [place] [address]    Report Status 01/04/2019 FINAL  Final  Blood culture (routine x 2)     Status: None (Preliminary result)   Collection Time: 01/02/19  6:41 PM   Specimen: BLOOD RIGHT HAND  Result Value Ref Range Status   Specimen Description BLOOD RIGHT HAND  Final   Special Requests   Final    BOTTLES DRAWN AEROBIC AND ANAEROBIC Blood Culture adequate volume   Culture   Final    NO GROWTH 3 DAYS Performed at [facility], [address], [place], [place] [address]    Report Status PENDING  Incomplete  Blood culture (routine x 2)  Status: None (Preliminary result)   Collection Time: 01/02/19  6:49 PM   Specimen: BLOOD LEFT ARM  Result Value Ref Range Status   Specimen Description BLOOD LEFT ARM  Final   Special Requests   Final    BOTTLES DRAWN AEROBIC AND ANAEROBIC Blood Culture adequate volume   Culture   Final    NO GROWTH 3 DAYS Performed at [facility] [facility], [address], [place], [place] [address]    Report Status PENDING  Incomplete   Time coordinating discharge: 35 minutes   SIGNED:  Irwin Brakeman, MD  [facility] 01/05/2019, 12:40 PM How to contact the [facility] Attending or Consulting provider [place] or covering provider during after hours [place], for this patient?  1. Check the care team in [facility] and look for a) attending/consulting [facility] provider listed and b) the [facility] team listed 2. Log into [email or web address] and use [place]'s universal password to access. If you do not have the password, please contact the hospital operator. 3. Locate the [facility] provider you are looking for under [facility] and page to a number that you can be directly reached. 4. If you still have difficulty reaching the provider, please  page the [facility] (Director on Call) for the Hospitalists listed on amion for assistance.

## 2019-01-05 NOTE — Discharge Instructions (Signed)
Acute Kidney Injury, Adult   Acute kidney injury is a sudden worsening of kidney function. The kidneys are organs that have several jobs. They filter the blood to remove waste products and extra fluid. They also maintain a healthy balance of minerals and hormones in the body, which helps control blood pressure and keep bones strong. With this condition, your kidneys do not do their jobs as well as they should. This condition ranges from mild to severe. Over time it may develop into long-lasting (chronic) kidney disease. Early detection and treatment may prevent acute kidney injury from developing into a chronic condition. What are the causes? Common causes of this condition include:  A problem with blood flow to the kidneys. This may be caused by: ? Low blood pressure (hypotension) or shock. ? Blood loss. ? Heart and blood vessel (cardiovascular) disease. ? Severe burns. ? Liver disease.  Direct damage to the kidneys. This may be caused by: ? Certain medicines. ? A kidney infection. ? Poisoning. ? Being around or in contact with toxic substances. ? A surgical wound. ? A hard, direct hit to the kidney area.  A sudden blockage of urine flow. This may be caused by: ? Cancer. ? Kidney stones. ? An enlarged prostate in males. What are the signs or symptoms? Symptoms of this condition may not be obvious until the condition becomes severe. Symptoms of this condition can include:  Tiredness (lethargy), or difficulty staying awake.  Nausea or vomiting.  Swelling (edema) of the face, legs, ankles, or feet.  Problems with urination, such as: ? Abdominal pain, or pain along the side of your stomach (flank). ? Decreased urine production. ? Decrease in the force of urine flow.  Muscle twitches and cramps, especially in the legs.  Confusion or trouble concentrating.  Loss of appetite.  Fever. How is this diagnosed? This condition may be diagnosed with tests, including:  Blood  tests.  Urine tests.  Imaging tests.  A test in which a sample of tissue is removed from the kidneys to be examined under a microscope (kidney biopsy). How is this treated? Treatment for this condition depends on the cause and how severe the condition is. In mild cases, treatment may not be needed. The kidneys may heal on their own. In more severe cases, treatment will involve:  Treating the cause of the kidney injury. This may involve changing any medicines you are taking or adjusting your dosage.  Fluids. You may need specialized IV fluids to balance your body's needs.  Having a catheter placed to drain urine and prevent blockages.  Preventing problems from occurring. This may mean avoiding certain medicines or procedures that can cause further injury to the kidneys. In some cases treatment may also require:  A procedure to remove toxic wastes from the body (dialysis or continuous renal replacement therapy - CRRT).  Surgery. This may be done to repair a torn kidney, or to remove the blockage from the urinary system. Follow these instructions at home: Medicines  Take over-the-counter and prescription medicines only as told by your health care provider.  Do not take any new medicines without your health care provider's approval. Many medicines can worsen your kidney damage.  Do not take any vitamin and mineral supplements without your health care provider's approval. Many nutritional supplements can worsen your kidney damage. Lifestyle  If your health care provider prescribed changes to your diet, follow them. You may need to decrease the amount of protein you eat.  Achieve and maintain a  healthy weight. If you need help with this, ask your health care provider.  Start or continue an exercise plan. Try to exercise at least 30 minutes a day, 5 days a week.  Do not use any tobacco products, such as cigarettes, chewing tobacco, and e-cigarettes. If you need help quitting, ask your  health care provider. General instructions  Keep track of your blood pressure. Report changes in your blood pressure as told by your health care provider.  Stay up to date with immunizations. Ask your health care provider which immunizations you need.  Keep all follow-up visits as told by your health care provider. This is important. Where to find more information  American Association of Kidney Patients: BombTimer.gl  National Kidney Foundation: www.kidney.[place]: https://mathis.com/  Life Options Rehabilitation Program: ? www.lifeoptions.org ? www.kidneyschool.org Contact a health care provider if:  Your symptoms get worse.  You develop new symptoms. Get help right away if:  You develop symptoms of worsening kidney disease, which include: ? Headaches. ? Abnormally dark or light skin. ? Easy bruising. ? Frequent hiccups. ? Chest pain. ? Shortness of breath. ? End of menstruation in women. ? Seizures. ? Confusion or altered mental status. ? Abdominal or back pain. ? Itchiness.  You have a fever.  Your body is producing less urine.  You have pain or bleeding when you urinate. Summary  Acute kidney injury is a sudden worsening of kidney function.  Acute kidney injury can be caused by problems with blood flow to the kidneys, direct damage to the kidneys, and sudden blockage of urine flow.  Symptoms of this condition may not be obvious until it becomes severe. Symptoms may include edema, lethargy, confusion, nausea or vomiting, and problems passing urine.  This condition can usually be diagnosed with blood tests, urine tests, and imaging tests. Sometimes a kidney biopsy is done to diagnose this condition.  Treatment for this condition often involves treating the underlying cause. It is treated with fluids, medicines, dialysis, diet changes, or surgery. This information is not intended to replace advice given to you by your health care provider. Make  sure you discuss any questions you have with your health care provider. Document Released: 12/11/2010 Document Revised: 05/10/2017 Document Reviewed: 05/18/2016 Elsevier Patient Education  [place].   Weakness Weakness is a lack of strength. You may feel weak all over your body (generalized), or you may feel weak in one part of your body (focal). There are many potential causes of weakness. Sometimes, the cause of your weakness may not be known. Some causes of weakness can be serious, so it is important to see your doctor. Follow these instructions at home: Activity  Rest as needed.  Try to get enough sleep. Most adults need 7-8 hours of sleep each night. Talk to your doctor about how much sleep you need each night.  Do exercises, such as arm curls and leg raises, for 30 minutes at least 2 days a week or as told by your doctor.  Think about working with a physical therapist or trainer to help you get stronger. General instructions   Take over-the-counter and prescription medicines only as told by your doctor.  Eat a healthy, well-balanced diet. This includes: ? Proteins to build muscles, such as lean meats and fish. ? Fresh fruits and vegetables. ? Carbohydrates to boost energy, such as whole grains.  Drink enough fluid to keep your pee (urine) pale yellow.  Keep all follow-up visits as told by your  doctor. This is important. Contact a doctor if:  Your weakness does not get better or it gets worse.  Your weakness affects your ability to: ? Think clearly. ? Do your normal daily activities. Get help right away if you:  Have sudden weakness on one side of your face or body.  Have chest pain.  Have trouble breathing or shortness of breath.  Have problems with your vision.  Have trouble talking or swallowing.  Have trouble standing or walking.  Are light-headed.  Pass out (lose consciousness). Summary  Weakness is a lack of strength. You may feel weak all  over your body or just in one part of your body.  There are many potential causes of weakness. Sometimes, the cause of your weakness may not be known.  Rest as needed, and try to get enough sleep. Most adults need 7-8 hours of sleep each night.  Eat a healthy, well-balanced diet. This information is not intended to replace advice given to you by your health care provider. Make sure you discuss any questions you have with your health care provider. Document Released: 05/10/2008 Document Revised: 01/01/2018 Document Reviewed: 01/01/2018 Elsevier Patient Education  [place].  Dehydration, Adult  Dehydration is when there is not enough fluid or water in your body. This happens when you lose more fluids than you take in. Dehydration can range from mild to very bad. It should be treated right away to keep it from getting very bad. Symptoms of mild dehydration may include:  Thirst.  Dry lips.  Slightly dry mouth.  Dry, warm skin.  Dizziness. Symptoms of moderate dehydration may include:  Very dry mouth.  Muscle cramps.  Dark pee (urine). Pee may be the color of tea.  Your body making less pee.  Your eyes making fewer tears.  Heartbeat that is uneven or faster than normal (palpitations).  Headache.  Light-headedness, especially when you stand up from sitting.  Fainting (syncope). Symptoms of very bad dehydration may include:  Changes in skin, such as: ? Cold and clammy skin. ? Blotchy (mottled) or pale skin. ? Skin that does not quickly return to normal after being lightly pinched and let go (poor skin turgor).  Changes in body fluids, such as: ? Feeling very thirsty. ? Your eyes making fewer tears. ? Not sweating when body temperature is high, such as in hot weather. ? Your body making very little pee.  Changes in vital signs, such as: ? Weak pulse. ? Pulse that is more than 100 beats a minute when you are sitting still. ? Fast breathing. ? Low blood  pressure.  Other changes, such as: ? Sunken eyes. ? Cold hands and feet. ? Confusion. ? Lack of energy (lethargy). ? Trouble waking up from sleep. ? Short-term weight loss. ? Unconsciousness. Follow these instructions at home:   If told by your doctor, drink an ORS: ? Make an ORS by using instructions on the package. ? Start by drinking small amounts, about  cup (120 mL) every 5-10 minutes. ? Slowly drink more until you have had the amount that your doctor said to have.  Drink enough clear fluid to keep your pee clear or pale yellow. If you were told to drink an ORS, finish the ORS first, then start slowly drinking clear fluids. Drink fluids such as: ? Water. Do not drink only water by itself. Doing that can make the salt (sodium) level in your body get too low (hyponatremia). ? Ice chips. ? Fruit juice that  you have added water to (diluted). ? Low-calorie sports drinks.  Avoid: ? Alcohol. ? Drinks that have a lot of sugar. These include high-calorie sports drinks, fruit juice that does not have water added, and soda. ? Caffeine. ? Foods that are greasy or have a lot of fat or sugar.  Take over-the-counter and prescription medicines only as told by your doctor.  Do not take salt tablets. Doing that can make the salt level in your body get too high (hypernatremia).  Eat foods that have minerals (electrolytes). Examples include bananas, oranges, potatoes, tomatoes, and spinach.  Keep all follow-up visits as told by your doctor. This is important. Contact a doctor if:  You have belly (abdominal) pain that: ? Gets worse. ? Stays in one area (localizes).  You have a rash.  You have a stiff neck.  You get angry or annoyed more easily than normal (irritability).  You are more sleepy than normal.  You have a harder time waking up than normal.  You feel: ? Weak. ? Dizzy. ? Very thirsty.  You have peed (urinated) only a small amount of very dark pee during 6-8  hours. Get help right away if:  You have symptoms of very bad dehydration.  You cannot drink fluids without throwing up (vomiting).  Your symptoms get worse with treatment.  You have a fever.  You have a very bad headache.  You are throwing up or having watery poop (diarrhea) and it: ? Gets worse. ? Does not go away.  You have blood or something green (bile) in your throw-up.  You have blood in your poop (stool). This may cause poop to look black and tarry.  You have not peed in 6-8 hours.  You pass out (faint).  Your heart rate when you are sitting still is more than 100 beats a minute.  You have trouble breathing. This information is not intended to replace advice given to you by your health care provider. Make sure you discuss any questions you have with your health care provider. Document Released: 03/24/2009 Document Revised: 05/10/2017 Document Reviewed: 07/22/2015 Elsevier Patient Education  2020 [place]. Urinary Tract Infection, Adult A urinary tract infection (UTI) is an infection of any part of the urinary tract. The urinary tract includes:  The kidneys.  The ureters.  The bladder.  The urethra. These organs make, store, and get rid of pee (urine) in the body. What are the causes? This is caused by germs (bacteria) in your genital area. These germs grow and cause swelling (inflammation) of your urinary tract. What increases the risk? You are more likely to develop this condition if:  You have a small, thin tube (catheter) to drain pee.  You cannot control when you pee or poop (incontinence).  You are female, and: ? You use these methods to prevent pregnancy: ? A medicine that kills sperm (spermicide). ? A device that blocks sperm (diaphragm). ? You have low levels of a female hormone (estrogen). ? You are pregnant.  You have genes that add to your risk.  You are sexually active.  You take antibiotic medicines.  You have trouble peeing  because of: ? A prostate that is bigger than normal, if you are female. ? A blockage in the part of your body that drains pee from the bladder (urethra). ? A kidney stone. ? A nerve condition that affects your bladder (neurogenic bladder). ? Not getting enough to drink. ? Not peeing often enough.  You have other conditions, such  as: ? Diabetes. ? A weak disease-fighting system (immune system). ? Sickle cell disease. ? Gout. ? Injury of the spine. What are the signs or symptoms? Symptoms of this condition include:  Needing to pee right away (urgently).  Peeing often.  Peeing small amounts often.  Pain or burning when peeing.  Blood in the pee.  Pee that smells bad or not like normal.  Trouble peeing.  Pee that is cloudy.  Fluid coming from the vagina, if you are female.  Pain in the belly or lower back. Other symptoms include:  Throwing up (vomiting).  No urge to eat.  Feeling mixed up (confused).  Being tired and grouchy (irritable).  A fever.  Watery poop (diarrhea). How is this treated? This condition may be treated with:  Antibiotic medicine.  Other medicines.  Drinking enough water. Follow these instructions at home:  Medicines  Take over-the-counter and prescription medicines only as told by your doctor.  If you were prescribed an antibiotic medicine, take it as told by your doctor. Do not stop taking it even if you start to feel better. General instructions  Make sure you: ? Pee until your bladder is empty. ? Do not hold pee for a long time. ? Empty your bladder after sex. ? Wipe from front to back after pooping if you are a female. Use each tissue one time when you wipe.  Drink enough fluid to keep your pee pale yellow.  Keep all follow-up visits as told by your doctor. This is important. Contact a doctor if:  You do not get better after 1-2 days.  Your symptoms go away and then come back. Get help right away if:  You have very  bad back pain.  You have very bad pain in your lower belly.  You have a fever.  You are sick to your stomach (nauseous).  You are throwing up. Summary  A urinary tract infection (UTI) is an infection of any part of the urinary tract.  This condition is caused by germs in your genital area.  There are many risk factors for a UTI. These include having a small, thin tube to drain pee and not being able to control when you pee or poop.  Treatment includes antibiotic medicines for germs.  Drink enough fluid to keep your pee pale yellow. This information is not intended to replace advice given to you by your health care provider. Make sure you discuss any questions you have with your health care provider. Document Released: 11/14/2007 Document Revised: 05/15/2018 Document Reviewed: 12/05/2017 Elsevier Patient Education  2020 [place].  IMPORTANT INFORMATION: PAY CLOSE ATTENTION   PHYSICIAN DISCHARGE INSTRUCTIONS  Follow with Primary care provider  [name], MD  and other consultants as instructed by your Hospitalist Physician  [place] IF SYMPTOMS COME BACK, WORSEN OR NEW PROBLEM DEVELOPS   Please note: You were cared for by a hospitalist during your hospital stay. Every effort will be made to forward records to your primary care provider.  You can request that your primary care provider send for your hospital records if they have not received them.  Once you are discharged, your primary care physician will handle any further medical issues. Please note that NO REFILLS for any discharge medications will be authorized once you are discharged, as it is imperative that you return to your primary care physician (or establish a relationship with a primary care physician if you do not have one)  for your post hospital discharge needs so that they can reassess your need for medications and monitor your lab values.  Please get a complete blood  count and chemistry panel checked by your Primary MD at your next visit, and again as instructed by your Primary MD.  Get Medicines reviewed and adjusted: Please take all your medications with you for your next visit with your Primary MD  Laboratory/radiological data: Please request your Primary MD to go over all hospital tests and procedure/radiological results at the follow up, please ask your primary care provider to get all Hospital records sent to his/her office.  In some cases, they will be blood work, cultures and biopsy results pending at the time of your discharge. Please request that your primary care provider follow up on these results.  If you are diabetic, please bring your blood sugar readings with you to your follow up appointment with primary care.    Please call and make your follow up appointments as soon as possible.    Also Note the following: If you experience worsening of your admission symptoms, develop shortness of breath, life threatening emergency, suicidal or homicidal thoughts you must seek medical attention immediately by calling 911 or calling your MD immediately  if symptoms less severe.  You must read complete instructions/literature along with all the possible adverse reactions/side effects for all the Medicines you take and that have been prescribed to you. Take any new Medicines after you have completely understood and accpet all the possible adverse reactions/side effects.   Do not drive when taking Pain medications or sleeping medications (Benzodiazepines)  Do not take more than prescribed Pain, Sleep and Anxiety Medications. It is not advisable to combine anxiety,sleep and pain medications without talking with your primary care practitioner  Special Instructions: If you have smoked or chewed Tobacco  in the last 2 yrs please stop smoking, stop any regular Alcohol  and or any Recreational drug use.  Wear Seat belts while driving.  Do not drive if taking  any narcotic, mind altering or controlled substances or recreational drugs or alcohol.

## 2019-01-06 ENCOUNTER — Telehealth: Payer: Self-pay | Admitting: Family Medicine

## 2019-01-06 ENCOUNTER — Telehealth: Payer: Self-pay

## 2019-01-06 DIAGNOSIS — G8929 Other chronic pain: Secondary | ICD-10-CM | POA: Diagnosis not present

## 2019-01-06 DIAGNOSIS — C9000 Multiple myeloma not having achieved remission: Secondary | ICD-10-CM | POA: Diagnosis not present

## 2019-01-06 DIAGNOSIS — E785 Hyperlipidemia, unspecified: Secondary | ICD-10-CM | POA: Diagnosis not present

## 2019-01-06 DIAGNOSIS — N183 Chronic kidney disease, stage 3 (moderate): Secondary | ICD-10-CM | POA: Diagnosis not present

## 2019-01-06 DIAGNOSIS — I129 Hypertensive chronic kidney disease with stage 1 through stage 4 chronic kidney disease, or unspecified chronic kidney disease: Secondary | ICD-10-CM | POA: Diagnosis not present

## 2019-01-06 DIAGNOSIS — E1142 Type 2 diabetes mellitus with diabetic polyneuropathy: Secondary | ICD-10-CM | POA: Diagnosis not present

## 2019-01-06 DIAGNOSIS — Z7951 Long term (current) use of inhaled steroids: Secondary | ICD-10-CM | POA: Diagnosis not present

## 2019-01-06 DIAGNOSIS — D631 Anemia in chronic kidney disease: Secondary | ICD-10-CM | POA: Diagnosis not present

## 2019-01-06 DIAGNOSIS — G4733 Obstructive sleep apnea (adult) (pediatric): Secondary | ICD-10-CM | POA: Diagnosis not present

## 2019-01-06 DIAGNOSIS — Z7982 Long term (current) use of aspirin: Secondary | ICD-10-CM | POA: Diagnosis not present

## 2019-01-06 DIAGNOSIS — Z7984 Long term (current) use of oral hypoglycemic drugs: Secondary | ICD-10-CM | POA: Diagnosis not present

## 2019-01-06 DIAGNOSIS — E1122 Type 2 diabetes mellitus with diabetic chronic kidney disease: Secondary | ICD-10-CM | POA: Diagnosis not present

## 2019-01-06 DIAGNOSIS — K219 Gastro-esophageal reflux disease without esophagitis: Secondary | ICD-10-CM | POA: Diagnosis not present

## 2019-01-06 DIAGNOSIS — Z9989 Dependence on other enabling machines and devices: Secondary | ICD-10-CM | POA: Diagnosis not present

## 2019-01-06 DIAGNOSIS — N390 Urinary tract infection, site not specified: Secondary | ICD-10-CM | POA: Diagnosis not present

## 2019-01-06 DIAGNOSIS — M5116 Intervertebral disc disorders with radiculopathy, lumbar region: Secondary | ICD-10-CM | POA: Diagnosis not present

## 2019-01-06 DIAGNOSIS — K5730 Diverticulosis of large intestine without perforation or abscess without bleeding: Secondary | ICD-10-CM | POA: Diagnosis not present

## 2019-01-06 DIAGNOSIS — M1990 Unspecified osteoarthritis, unspecified site: Secondary | ICD-10-CM | POA: Diagnosis not present

## 2019-01-06 DIAGNOSIS — K760 Fatty (change of) liver, not elsewhere classified: Secondary | ICD-10-CM | POA: Diagnosis not present

## 2019-01-06 DIAGNOSIS — Z79899 Other long term (current) drug therapy: Secondary | ICD-10-CM | POA: Diagnosis not present

## 2019-01-06 DIAGNOSIS — Z87891 Personal history of nicotine dependence: Secondary | ICD-10-CM | POA: Diagnosis not present

## 2019-01-06 DIAGNOSIS — Z8673 Personal history of transient ischemic attack (TIA), and cerebral infarction without residual deficits: Secondary | ICD-10-CM | POA: Diagnosis not present

## 2019-01-06 NOTE — Telephone Encounter (Signed)
Left message giving verbal orders.  

## 2019-01-06 NOTE — Telephone Encounter (Signed)
Transition Care Management Follow-up Telephone Call   Date discharged? 01/05/2019               How have you been since you were released from the hospital? tired   Do you understand why you were in the hospital? Dehydrated, uti   Do you understand the discharge instructions?yes   Where were you discharged to? home   Items Reviewed:  Medications reviewed: yes  Allergies reviewed: yes  Dietary changes reviewed: yes  Referrals reviewed: yes   Functional Questionnaire:   Activities of Daily Living (ADLs): not all of them     Any transportation issues/concerns?: no   Any patient concerns? needs someone to help with light cleaning   Confirmed importance and date/time of follow-up visits scheduled      Confirmed with patient if condition begins to worsen call PCP or go to the ER.  Patient was given the office number and encouraged to call back with question or concerns. Yes with verbal understanding

## 2019-01-06 NOTE — Telephone Encounter (Signed)
Please call with Verbal orders 2 w 2 1 w 1  [phone]

## 2019-01-06 NOTE — Telephone Encounter (Signed)
Unable to reach.

## 2019-01-07 ENCOUNTER — Telehealth: Payer: Self-pay | Admitting: Family Medicine

## 2019-01-07 ENCOUNTER — Ambulatory Visit (HOSPITAL_COMMUNITY): Payer: Medicare Other

## 2019-01-07 DIAGNOSIS — Z87891 Personal history of nicotine dependence: Secondary | ICD-10-CM | POA: Diagnosis not present

## 2019-01-07 DIAGNOSIS — N390 Urinary tract infection, site not specified: Secondary | ICD-10-CM | POA: Diagnosis not present

## 2019-01-07 DIAGNOSIS — N183 Chronic kidney disease, stage 3 (moderate): Secondary | ICD-10-CM | POA: Diagnosis not present

## 2019-01-07 DIAGNOSIS — D631 Anemia in chronic kidney disease: Secondary | ICD-10-CM | POA: Diagnosis not present

## 2019-01-07 DIAGNOSIS — I129 Hypertensive chronic kidney disease with stage 1 through stage 4 chronic kidney disease, or unspecified chronic kidney disease: Secondary | ICD-10-CM | POA: Diagnosis not present

## 2019-01-07 DIAGNOSIS — E1122 Type 2 diabetes mellitus with diabetic chronic kidney disease: Secondary | ICD-10-CM | POA: Diagnosis not present

## 2019-01-07 DIAGNOSIS — E1142 Type 2 diabetes mellitus with diabetic polyneuropathy: Secondary | ICD-10-CM | POA: Diagnosis not present

## 2019-01-07 DIAGNOSIS — K219 Gastro-esophageal reflux disease without esophagitis: Secondary | ICD-10-CM | POA: Diagnosis not present

## 2019-01-07 DIAGNOSIS — Z7982 Long term (current) use of aspirin: Secondary | ICD-10-CM | POA: Diagnosis not present

## 2019-01-07 DIAGNOSIS — K760 Fatty (change of) liver, not elsewhere classified: Secondary | ICD-10-CM | POA: Diagnosis not present

## 2019-01-07 DIAGNOSIS — Z9989 Dependence on other enabling machines and devices: Secondary | ICD-10-CM | POA: Diagnosis not present

## 2019-01-07 DIAGNOSIS — E785 Hyperlipidemia, unspecified: Secondary | ICD-10-CM | POA: Diagnosis not present

## 2019-01-07 DIAGNOSIS — Z79899 Other long term (current) drug therapy: Secondary | ICD-10-CM | POA: Diagnosis not present

## 2019-01-07 DIAGNOSIS — Z8673 Personal history of transient ischemic attack (TIA), and cerebral infarction without residual deficits: Secondary | ICD-10-CM | POA: Diagnosis not present

## 2019-01-07 DIAGNOSIS — K5730 Diverticulosis of large intestine without perforation or abscess without bleeding: Secondary | ICD-10-CM | POA: Diagnosis not present

## 2019-01-07 DIAGNOSIS — G4733 Obstructive sleep apnea (adult) (pediatric): Secondary | ICD-10-CM | POA: Diagnosis not present

## 2019-01-07 DIAGNOSIS — C9000 Multiple myeloma not having achieved remission: Secondary | ICD-10-CM | POA: Diagnosis not present

## 2019-01-07 DIAGNOSIS — Z7951 Long term (current) use of inhaled steroids: Secondary | ICD-10-CM | POA: Diagnosis not present

## 2019-01-07 DIAGNOSIS — Z7984 Long term (current) use of oral hypoglycemic drugs: Secondary | ICD-10-CM | POA: Diagnosis not present

## 2019-01-07 DIAGNOSIS — G8929 Other chronic pain: Secondary | ICD-10-CM | POA: Diagnosis not present

## 2019-01-07 DIAGNOSIS — M5116 Intervertebral disc disorders with radiculopathy, lumbar region: Secondary | ICD-10-CM | POA: Diagnosis not present

## 2019-01-07 DIAGNOSIS — M1990 Unspecified osteoarthritis, unspecified site: Secondary | ICD-10-CM | POA: Diagnosis not present

## 2019-01-07 LAB — CULTURE, BLOOD (ROUTINE X 2)
Culture: NO GROWTH
Culture: NO GROWTH
Special Requests: ADEQUATE
Special Requests: ADEQUATE

## 2019-01-07 NOTE — Telephone Encounter (Signed)
Cheryl Abbott needs verbal orders  2 x week for 3 weeks

## 2019-01-08 ENCOUNTER — Ambulatory Visit (INDEPENDENT_AMBULATORY_CARE_PROVIDER_SITE_OTHER): Payer: Medicare Other | Admitting: Family Medicine

## 2019-01-08 ENCOUNTER — Other Ambulatory Visit: Payer: Self-pay

## 2019-01-08 ENCOUNTER — Encounter: Payer: Self-pay | Admitting: Family Medicine

## 2019-01-08 ENCOUNTER — Encounter: Payer: Medicare Other | Admitting: Family Medicine

## 2019-01-08 DIAGNOSIS — R197 Diarrhea, unspecified: Secondary | ICD-10-CM | POA: Diagnosis not present

## 2019-01-08 DIAGNOSIS — Z9189 Other specified personal risk factors, not elsewhere classified: Secondary | ICD-10-CM | POA: Diagnosis not present

## 2019-01-08 DIAGNOSIS — N183 Chronic kidney disease, stage 3 unspecified: Secondary | ICD-10-CM

## 2019-01-08 DIAGNOSIS — IMO0001 Reserved for inherently not codable concepts without codable children: Secondary | ICD-10-CM

## 2019-01-08 MED ORDER — LOPERAMIDE HCL 2 MG PO TABS: 2.0000 mg | ORAL_TABLET | Freq: Two times a day (BID) | ORAL | 0 refills | Status: DC | PRN

## 2019-01-08 NOTE — Progress Notes (Signed)
Virtual Visit via Telephone Note   This visit type was conducted due to national recommendations for restrictions regarding the COVID-19 Pandemic (e.g. social distancing) in an effort to limit this patient's exposure and mitigate transmission in our community.  Due to her co-morbid illnesses, this patient is at least at moderate risk for complications without adequate follow up.  This format is felt to be most appropriate for this patient at this time.  The patient did not have access to video technology/had technical difficulties with video requiring transitioning to audio format only (telephone).  All issues noted in this document were discussed and addressed.  No physical exam could be performed with this format.    Evaluation Performed:  Follow-up visit  Date:  01/08/2019   ID:  Cheryl Abbott, Cheryl Abbott [birth year], MRN [id number]  Patient Location: Home Provider Location: Office  Location of Patient: Home Location of Provider: Telehealth Consent was obtain for visit to be over via telehealth. I verified that I am speaking with the correct person using two identifiers.  PCP:  [name], MD   Chief Complaint:  TOC  History of Present Illness:    Cheryl Abbott is a 73 y.o. female with Cheryl Gauna Pickardis a 73 y.o.femalewith medical history significant fordiabetes mellitus, hypertension, CKD 3, multiple myeloma, CVA, anxiety and depression. Presented to the Ed with weakness for several weeks. Poor intake orally. Denied N/V. Denied diarrhea, abdominal pain. No pain with urination, just frequency. Mild cough, dry. 2 weeks and denied fever and chills.  In the ED she was found to have heart rate in the 50s 60s, blood pressure was only 112/62 and dropped to 77/39 in the emergency room she was bolused with 2 L of normal saline.  Her oxygen level stayed greater than 93% on room WBCs were stable at 6.  UA demonstrated large leukocytes with few bacteria x-ray of the chest showed  right base atelectasis. EKG EKG demonstrated sinus rhythm without significant she was started on IV Cipro as she has a penicillin allergy.  And admitted with UTI-sepsis related to UTI/generalized weakness.  On discharge: Sepsis had resolved.  She was no longer tachypnic, tachycardic or demonstrating hypotension.  Urine cultures had no growth.  Blood cultures had no growth to date.   Discharged on August 27.  Reports that she has had diarrhea since coming home.  Reports that she still feels weak "woozy" and feeling out of sorts.  Reports that when she goes to pee she is also having liquid stool.  Denies trying anything over-the-counter to stop this at this time.  Reports she is drinking 16 ounces water 3-4 times daily. Reports that she has not picked up her Lasix to start on Saturday, August 1 as directed by the hospitalist. She has not contacted her oncologist either for follow-up.  The patient does not have symptoms concerning for COVID-19 infection (fever, chills, cough, or new shortness of breath).   Past Medical, Surgical, Social History, Allergies, and Medications have been Reviewed.   Past Medical History:  Diagnosis Date  . Anemia   . Anxiety   . Anxiety disorder   . Arthritis   . Cancer ([place])    acute myeloma  . 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 Chronic back pain   . Depression   . Fatty liver   . GERD (gastroesophageal reflux disease)   . Headache   . History of adenomatous polyp of colon    2008  . History  of kidney stones   . Hypercalcemia   . Hyperlipidemia   . Hypertension   . Left ureteral stone   . Lipoma of arm 01/19/2015  . Lumbar disc disease with radiculopathy   . Nephrolithiasis   . Neuropathy, peripheral   . OSA treated with BiPAP    per study 2007  . Sciatica   . Shoulder pain, bilateral   . Sigmoid diverticulosis   . Type 2 diabetes mellitus ([place])    Past Surgical History:  Procedure Laterality Date  . BIOPSY N/A 03/17/2013   Procedure:  GASTRIC BIOPSIES;  Surgeon: Danie Binder, MD;  Location: [facility];  Service: Endoscopy;  Laterality: N/A;  . BIOPSY  06/10/2015   Procedure: BIOPSY;  Surgeon: Danie Binder, MD;  [facility];  Service: Endoscopy;;  gastric biopsy  . CARDIAC CATHETERIZATION  05-11-2003  dr Shelva Majestic ([facility])   Abnormal cardiolite/   Normal coronary arteries and normal LVF,  ef  63%  . CARDIOVASCULAR STRESS TEST  01-01-2014   normal lexiscan cardiolite/  no ischemia/ infarct/  normal LV function and wall motion ,  ef 81%  . COLONOSCOPY  last one 10/02/2011   MOD [place] TICS, IH, NEXT TCS APR 2018  . COLONOSCOPY WITH PROPOFOL N/A 02/26/2017   Procedure: COLONOSCOPY WITH PROPOFOL;  Surgeon: Danie Binder, MD;  [facility];  Service: Endoscopy;  Laterality: N/A;  11:00am  . CYSTO/   RIGHT URETEROSOCOPY LASER LITHOTRIPSY STONE EXTRACTION  10-13-2004  . CYSTO/  RIGHT RETROGRADE PYELOGRAM/  PLACMENT RIGHT URETERAL STENT  01-10-2010  . CYSTOSCOPY W/ URETERAL STENT PLACEMENT Right 08/19/2015   Procedure: CYSTOSCOPY WITH RETROGRADE PYELOGRAM/URETERAL STENT PLACEMENT;  Surgeon: Franchot Gallo, MD;  Location: [facility];  Service: Urology;  Laterality: Right;  . CYSTOSCOPY WITH RETROGRADE PYELOGRAM, URETEROSCOPY AND STENT PLACEMENT Left 10/21/2014   Procedure: CYSTOSCOPY WITH RETROGRADE PYELOGRAM, URETEROSCOPY AND STENT PLACEMENT;  Surgeon: Franchot Gallo, MD;  Location: [facility];  Service: Urology;  Laterality: Left;  . CYSTOSCOPY WITH RETROGRADE PYELOGRAM, URETEROSCOPY AND STENT PLACEMENT Right 11/22/2015   Procedure: CYSTOSCOPY, RIGHT URETERAL STENT REMOVAL; RIGHT RETROGRADE PYELOGRAM, RIGHT URETEROSCOPY WITH BALLOON DILATION; RIGHT URETERAL STENT PLACEMENT;  Surgeon: Franchot Gallo, MD;  [facility];  Service: Urology;  Laterality: Right;  . CYSTOSCOPY/RETROGRADE/URETEROSCOPY/STONE EXTRACTION WITH BASKET Bilateral 08/02/2015   Procedure: CYSTOSCOPY;  BILATERAL RETROGRADE PYELOGRAMS; BILATERAL URETEROSCOPY, STONE EXTRACTION WITH BASKET;  Surgeon: Franchot Gallo, MD;  [facility];  Service: Urology;  Laterality: Bilateral;  . CYSTOSCOPY/RETROGRADE/URETEROSCOPY/STONE EXTRACTION WITH BASKET Right 06/28/2015   Procedure: CYSTOSCOPY/RETROGRADE/URETEROSCOPY/STONE EXTRACTION WITH BASKET, RIGHT URETERAL DOUBLE J STENT PLACEMENT;  Surgeon: Franchot Gallo, MD;  [facility];  Service: Urology;  Laterality: Right;  . ESOPHAGOGASTRODUODENOSCOPY (EGD) WITH PROPOFOL N/A 03/17/2013   Procedure: ESOPHAGOGASTRODUODENOSCOPY (EGD) WITH PROPOFOL;  Surgeon: Danie Binder, MD;  [facility];  Service: Endoscopy;  Laterality: N/A;  . ESOPHAGOGASTRODUODENOSCOPY (EGD) WITH PROPOFOL N/A 06/10/2015   Distal gastritis, distal esophageal stricture s/p dilation  . ESOPHAGOGASTRODUODENOSCOPY (EGD) WITH PROPOFOL N/A 02/26/2017   Procedure: ESOPHAGOGASTRODUODENOSCOPY (EGD) WITH PROPOFOL;  Surgeon: Danie Binder, MD;  [facility];  Service: Endoscopy;  Laterality: N/A;  . FLEXIBLE SIGMOIDOSCOPY  09/11/2011   LTR:VUYEBXID Internal hemorrhoids  . HOLMIUM LASER APPLICATION Left 5/68/6168   Procedure: HOLMIUM LASER APPLICATION;  Surgeon: Franchot Gallo, MD;  [facility];  Service: Urology;  Laterality: Left;  . HOLMIUM LASER APPLICATION Right 3/72/9021   Procedure: HOLMIUM LASER  APPLICATION;  Surgeon: Franchot Gallo, MD;  [facility];  Service: Urology;  Laterality: Right;  . MASS EXCISION Left 03/04/2015   Procedure: EXCISION OF SOFT TISSUE NEOPLASM LEFT ARM;  Surgeon: Aviva Signs, MD;  Location: [facility];  Service: General;  Laterality: Left;  . PERCUTANEOUS NEPHROSTOLITHOTOMY Bilateral 12/  2011     [facility]  . POLYPECTOMY N/A 03/17/2013   Procedure: GASTRIC POLYPECTOMY;  Surgeon: Danie Binder, MD;  [facility];  Service: Endoscopy;  Laterality: N/A;  . POLYPECTOMY  02/26/2017   Procedure: POLYPECTOMY;   Surgeon: Danie Binder, MD;  [facility];  Service: Endoscopy;;  polyp at cecum, ascending colon polyps x3, hepatic flexure polyps x8, transverse colon polyps x8   . REMOVAL RIGHT THIGH CYST  2006  . SAVORY DILATION N/A 03/17/2013   Procedure: SAVORY DILATION;  Surgeon: Danie Binder, MD;  Location: [facility];  Service: Endoscopy;  Laterality: N/A;  #12.8, 14, 15, 16 dilators used  . SAVORY DILATION N/A 06/10/2015   Procedure: SAVORY DILATION;  Surgeon: Danie Binder, MD;  [facility];  Service: Endoscopy;  Laterality: N/A;  . TRANSTHORACIC ECHOCARDIOGRAM  01-01-2014   mild LVH/  ef 08-65%/  grade I diastolic dysfunction/  trivial MR, TR, and PR  . VAGINAL HYSTERECTOMY  1970's     No outpatient medications have been marked as taking for the 01/08/19 encounter (Office Visit) with Perlie Mayo, NP.     Allergies:   Ace inhibitors, Keflex [cephalexin], Nitrofurantoin, and Penicillins   Social History   Tobacco Use  . Smoking status: Former Smoker    Packs/day: 1.00    Years: 20.00    Pack years: 20.00    Types: Cigarettes    Start date: 09/21/1974    Quit date: 09/20/1988    Years since quitting: 30.3  . Smokeless tobacco: Never Used  . Tobacco comment: Quit x 20 years  Substance Use Topics  . Alcohol use: No    Alcohol/week: 0.0 standard drinks  . Drug use: No     Family Hx: The patient's family history includes Arthritis in an other family member; Cancer in an other family member; Dementia in her mother; Diabetes in her brother, mother, and another family member; Emphysema in her father; GER disease in her brother; Heart defect in an other family member; Heart failure in her mother; Hypertension in her father, mother, sister, and sister. There is no history of Anesthesia problems, Hypotension, Malignant hyperthermia, Pseudochol deficiency, or Colon cancer.  ROS:   Please see the history of present illness.    All other systems reviewed and are negative.    Labs/Other Tests and Data Reviewed:     Recent Labs: 05/21/2018: Magnesium 2.3 11/19/2018: ALT 14 01/03/2019: Hemoglobin 11.5; Platelets 202 01/05/2019: BUN 11; Creatinine, Ser 1.47; Potassium 3.8; Sodium 138   Recent Lipid Panel Lab Results  Component Value Date/Time   CHOL 162 11/19/2018 12:13 PM   TRIG 121 11/19/2018 12:13 PM   HDL 47 11/19/2018 12:13 PM   CHOLHDL 3.4 11/19/2018 12:13 PM   LDLCALC 91 11/19/2018 12:13 PM   LDLCALC 103 (H) 05/21/2018 01:08 PM    Wt Readings from Last 3 Encounters:  01/08/19 200 lb (90.7 kg)  01/02/19 205 lb (93 kg)  12/31/18 200 lb (90.7 kg)     Objective:    Vital Signs:  There were no vitals taken for this visit.   GEN:  Alert and oriented RESPIRATORY:  No noted  shortness of breath in conversation PSYCH:  Normal affect and mood  good communication  ASSESSMENT & PLAN:    1. Transition of care performed with sharing of clinical summary As documented above. Ordered CMP for next week  Follow Up 1 week due to on going symptoms Additionally, advised to not use flexeril, xanax, at the same time as she reports feeling "woozy"  2. CKD (chronic kidney disease) stage 3, GFR 30-59 ml/min (HCC) Will check CMP. Pending function will look at medications if changes needed. Advised to get her lasix and start as directed at hospital.  - COMPLETE METABOLIC PANEL WITH GFR  3. Diarrhea, unspecified type Encouraged her to get OTC imodium. Encouraged to stay hydrated.  - loperamide (IMODIUM A-D) 2 MG tablet; Take 1 tablet (2 mg total) by mouth 2 (two) times daily as needed for diarrhea or loose stools.  Dispense: 30 tablet; Refill: 0  Time:   Today, I have spent 15 minutes with the patient with telehealth technology discussing the above problems.     Medication Adjustments/Labs and Tests Ordered: Current medicines are reviewed at length with the patient today.  Concerns regarding medicines are outlined above.   Tests Ordered: Orders  Placed This Encounter  Procedures  . COMPLETE METABOLIC PANEL WITH GFR    Medication Changes: Meds ordered this encounter  Medications  . loperamide (IMODIUM A-D) 2 MG tablet    Sig: Take 1 tablet (2 mg total) by mouth 2 (two) times daily as needed for diarrhea or loose stools.    Dispense:  30 tablet    Refill:  0    Order Specific Question:   Supervising Provider    Answer:   Tula Nakayama E [[id number]]    Disposition:  Follow up 1 wk (in office would be best)  Signed, Perlie Mayo, NP  01/08/2019 2:48 PM     [facility]

## 2019-01-08 NOTE — Patient Instructions (Signed)
    Thank you for coming into the office today. I appreciate the opportunity to provide you with the care for your health and wellness. Today we discussed: post hospital visit  Follow-up: 1 week from today, in office   Labs ordered today, please get no later than Monday. You do not have to be fasting.  Please get imodium, and lasix from pharmacy today. Takes as directed.  Stay hydrated with water. Try to eat as you can.  Please continue to practice social distancing to keep you, your family, and our community safe.  If you must go out, please wear a Mask and practice good handwashing.  [place] YOUR HANDS WELL AND FREQUENTLY. AVOID TOUCHING YOUR FACE, UNLESS YOUR HANDS ARE FRESHLY WASHED.  GET FRESH AIR DAILY. STAY HYDRATED WITH WATER.   It was a pleasure to see you and I look forward to continuing to work together on your health and well-being. Please do not hesitate to call the office if you need care or have questions about your care.  Have a wonderful day and week.  With Gratitude,  Cherly Beach, DNP, AGNP-BC

## 2019-01-08 NOTE — Telephone Encounter (Signed)
Verbal ok given for PT

## 2019-01-08 NOTE — Patient Outreach (Signed)
[place] [facility]) Care Management  01/08/2019  Cheryl Abbott [birth year] [id number]   EMMI- General Discharge RED ON EMMI ALERT Day # 1 Date: 01/07/2019 Red Alert Reason:  Know who to call about changes in condition? No  Transportation to follow-up? No    Outreach attempt: No answer.  HIPAA compliant voice message left.    Plan: RN CM will attempt patient again within 4 business days and send letter.    Jone Baseman, RN, MSN [facility] Care Management Care Management Coordinator Direct Line [phone] Toll Free: [phone]  Fax: [phone]

## 2019-01-09 ENCOUNTER — Other Ambulatory Visit: Payer: Self-pay

## 2019-01-09 DIAGNOSIS — M5116 Intervertebral disc disorders with radiculopathy, lumbar region: Secondary | ICD-10-CM | POA: Diagnosis not present

## 2019-01-09 DIAGNOSIS — G8929 Other chronic pain: Secondary | ICD-10-CM | POA: Diagnosis not present

## 2019-01-09 DIAGNOSIS — E785 Hyperlipidemia, unspecified: Secondary | ICD-10-CM | POA: Diagnosis not present

## 2019-01-09 DIAGNOSIS — Z9989 Dependence on other enabling machines and devices: Secondary | ICD-10-CM | POA: Diagnosis not present

## 2019-01-09 DIAGNOSIS — Z79899 Other long term (current) drug therapy: Secondary | ICD-10-CM | POA: Diagnosis not present

## 2019-01-09 DIAGNOSIS — Z7984 Long term (current) use of oral hypoglycemic drugs: Secondary | ICD-10-CM | POA: Diagnosis not present

## 2019-01-09 DIAGNOSIS — Z7982 Long term (current) use of aspirin: Secondary | ICD-10-CM | POA: Diagnosis not present

## 2019-01-09 DIAGNOSIS — N390 Urinary tract infection, site not specified: Secondary | ICD-10-CM | POA: Diagnosis not present

## 2019-01-09 DIAGNOSIS — K219 Gastro-esophageal reflux disease without esophagitis: Secondary | ICD-10-CM | POA: Diagnosis not present

## 2019-01-09 DIAGNOSIS — E1122 Type 2 diabetes mellitus with diabetic chronic kidney disease: Secondary | ICD-10-CM | POA: Diagnosis not present

## 2019-01-09 DIAGNOSIS — D631 Anemia in chronic kidney disease: Secondary | ICD-10-CM | POA: Diagnosis not present

## 2019-01-09 DIAGNOSIS — N183 Chronic kidney disease, stage 3 (moderate): Secondary | ICD-10-CM | POA: Diagnosis not present

## 2019-01-09 DIAGNOSIS — K5730 Diverticulosis of large intestine without perforation or abscess without bleeding: Secondary | ICD-10-CM | POA: Diagnosis not present

## 2019-01-09 DIAGNOSIS — E1142 Type 2 diabetes mellitus with diabetic polyneuropathy: Secondary | ICD-10-CM | POA: Diagnosis not present

## 2019-01-09 DIAGNOSIS — C9000 Multiple myeloma not having achieved remission: Secondary | ICD-10-CM | POA: Diagnosis not present

## 2019-01-09 DIAGNOSIS — I129 Hypertensive chronic kidney disease with stage 1 through stage 4 chronic kidney disease, or unspecified chronic kidney disease: Secondary | ICD-10-CM | POA: Diagnosis not present

## 2019-01-09 DIAGNOSIS — Z8673 Personal history of transient ischemic attack (TIA), and cerebral infarction without residual deficits: Secondary | ICD-10-CM | POA: Diagnosis not present

## 2019-01-09 DIAGNOSIS — K760 Fatty (change of) liver, not elsewhere classified: Secondary | ICD-10-CM | POA: Diagnosis not present

## 2019-01-09 DIAGNOSIS — Z87891 Personal history of nicotine dependence: Secondary | ICD-10-CM | POA: Diagnosis not present

## 2019-01-09 DIAGNOSIS — M1990 Unspecified osteoarthritis, unspecified site: Secondary | ICD-10-CM | POA: Diagnosis not present

## 2019-01-09 DIAGNOSIS — G4733 Obstructive sleep apnea (adult) (pediatric): Secondary | ICD-10-CM | POA: Diagnosis not present

## 2019-01-09 DIAGNOSIS — Z7951 Long term (current) use of inhaled steroids: Secondary | ICD-10-CM | POA: Diagnosis not present

## 2019-01-09 NOTE — Patient Outreach (Signed)
[place] [facility]) Care Management  01/09/2019  Cheryl Abbott 06-18-1945 [id number]   EMMI- General Discharge RED ON EMMI ALERT Day # 1 Date:01/07/2019 Red Alert Reason: Know who to call about changes in condition? No  Transportation to follow-up? No    Outreach attempt:  spoke with patient. She is asleep.  CM contact information given for return call.   Plan: RN CM will wait return call. If no return call will attempt again within 4 business days.    Jone Baseman, RN, MSN [place] Management Care Management Coordinator Direct Line [phone] Cell [phone] Toll Free: [phone]  Fax: [phone]

## 2019-01-10 DIAGNOSIS — K760 Fatty (change of) liver, not elsewhere classified: Secondary | ICD-10-CM | POA: Diagnosis not present

## 2019-01-10 DIAGNOSIS — G4733 Obstructive sleep apnea (adult) (pediatric): Secondary | ICD-10-CM | POA: Diagnosis not present

## 2019-01-10 DIAGNOSIS — D631 Anemia in chronic kidney disease: Secondary | ICD-10-CM | POA: Diagnosis not present

## 2019-01-10 DIAGNOSIS — M5116 Intervertebral disc disorders with radiculopathy, lumbar region: Secondary | ICD-10-CM | POA: Diagnosis not present

## 2019-01-10 DIAGNOSIS — E1122 Type 2 diabetes mellitus with diabetic chronic kidney disease: Secondary | ICD-10-CM | POA: Diagnosis not present

## 2019-01-10 DIAGNOSIS — I129 Hypertensive chronic kidney disease with stage 1 through stage 4 chronic kidney disease, or unspecified chronic kidney disease: Secondary | ICD-10-CM | POA: Diagnosis not present

## 2019-01-10 DIAGNOSIS — N183 Chronic kidney disease, stage 3 (moderate): Secondary | ICD-10-CM | POA: Diagnosis not present

## 2019-01-10 DIAGNOSIS — M1990 Unspecified osteoarthritis, unspecified site: Secondary | ICD-10-CM | POA: Diagnosis not present

## 2019-01-10 DIAGNOSIS — Z87891 Personal history of nicotine dependence: Secondary | ICD-10-CM | POA: Diagnosis not present

## 2019-01-10 DIAGNOSIS — K219 Gastro-esophageal reflux disease without esophagitis: Secondary | ICD-10-CM | POA: Diagnosis not present

## 2019-01-10 DIAGNOSIS — G8929 Other chronic pain: Secondary | ICD-10-CM | POA: Diagnosis not present

## 2019-01-10 DIAGNOSIS — N390 Urinary tract infection, site not specified: Secondary | ICD-10-CM | POA: Diagnosis not present

## 2019-01-10 DIAGNOSIS — Z7984 Long term (current) use of oral hypoglycemic drugs: Secondary | ICD-10-CM | POA: Diagnosis not present

## 2019-01-10 DIAGNOSIS — Z79899 Other long term (current) drug therapy: Secondary | ICD-10-CM | POA: Diagnosis not present

## 2019-01-10 DIAGNOSIS — C9000 Multiple myeloma not having achieved remission: Secondary | ICD-10-CM | POA: Diagnosis not present

## 2019-01-10 DIAGNOSIS — Z7982 Long term (current) use of aspirin: Secondary | ICD-10-CM | POA: Diagnosis not present

## 2019-01-10 DIAGNOSIS — K5730 Diverticulosis of large intestine without perforation or abscess without bleeding: Secondary | ICD-10-CM | POA: Diagnosis not present

## 2019-01-10 DIAGNOSIS — Z7951 Long term (current) use of inhaled steroids: Secondary | ICD-10-CM | POA: Diagnosis not present

## 2019-01-10 DIAGNOSIS — E1142 Type 2 diabetes mellitus with diabetic polyneuropathy: Secondary | ICD-10-CM | POA: Diagnosis not present

## 2019-01-10 DIAGNOSIS — Z8673 Personal history of transient ischemic attack (TIA), and cerebral infarction without residual deficits: Secondary | ICD-10-CM | POA: Diagnosis not present

## 2019-01-10 DIAGNOSIS — Z9989 Dependence on other enabling machines and devices: Secondary | ICD-10-CM | POA: Diagnosis not present

## 2019-01-10 DIAGNOSIS — E785 Hyperlipidemia, unspecified: Secondary | ICD-10-CM | POA: Diagnosis not present

## 2019-01-10 LAB — COMPLETE METABOLIC PANEL WITH GFR
AG Ratio: 1.2 (calc) (ref 1.0–2.5)
ALT: 13 U/L (ref 6–29)
AST: 17 U/L (ref 10–35)
Albumin: 4.1 g/dL (ref 3.6–5.1)
Alkaline phosphatase (APISO): 63 U/L (ref 37–153)
BUN/Creatinine Ratio: 6 (calc) (ref 6–22)
BUN: 9 mg/dL (ref 7–25)
CO2: 23 mmol/L (ref 20–32)
Calcium: 9.6 mg/dL (ref 8.6–10.4)
Chloride: 104 mmol/L (ref 98–110)
Creat: 1.49 mg/dL — ABNORMAL HIGH (ref 0.60–0.93)
GFR, Est African American: 40 mL/min/{1.73_m2} — ABNORMAL LOW (ref >=60)
GFR, Est Non African American: 34 mL/min/{1.73_m2} — ABNORMAL LOW (ref >=60)
Globulin: 3.5 g/dL (calc) (ref 1.9–3.7)
Glucose, Bld: 227 mg/dL — ABNORMAL HIGH (ref 65–139)
Potassium: 4 mmol/L (ref 3.5–5.3)
Sodium: 136 mmol/L (ref 135–146)
Total Bilirubin: 0.2 mg/dL (ref 0.2–1.2)
Total Protein: 7.6 g/dL (ref 6.1–8.1)

## 2019-01-12 ENCOUNTER — Other Ambulatory Visit: Payer: Self-pay

## 2019-01-12 ENCOUNTER — Other Ambulatory Visit: Payer: Self-pay | Admitting: Family Medicine

## 2019-01-12 NOTE — Patient Outreach (Signed)
[place] [facility]) Care Management  01/12/2019  KORALINE PHILLIPSON Jan 24, 1946 [id number]   EMMI-General Discharge RED ON EMMI ALERT Day #1 Date:01/07/2019 Red Alert Reason: Know who to call about changes in condition? No  Transportation to follow-up? No    Outreach attempt: spoke with patient.  She states that she is doing ok.  Addressed red alerts with patient. She knows to call physician for any problems.  She states she has transportation to appointments through her family friend and family.  She states that her family has arranged for her to have help in the home 3 days a week and a cousin also comes by on other days.  Home health nurse coming in to see patient.  Patient feels she is getting better but using walker to ambulate. Patient denies any further questions or needs at this time.    Plan: RN CM will close case.    Jone Baseman, RN, MSN [place] Management Care Management Coordinator Direct Line [phone] Cell [phone] Toll Free: [phone]  Fax: [phone]

## 2019-01-13 ENCOUNTER — Ambulatory Visit: Payer: Self-pay

## 2019-01-13 DIAGNOSIS — Z79899 Other long term (current) drug therapy: Secondary | ICD-10-CM | POA: Diagnosis not present

## 2019-01-13 DIAGNOSIS — Z87891 Personal history of nicotine dependence: Secondary | ICD-10-CM | POA: Diagnosis not present

## 2019-01-13 DIAGNOSIS — K5730 Diverticulosis of large intestine without perforation or abscess without bleeding: Secondary | ICD-10-CM | POA: Diagnosis not present

## 2019-01-13 DIAGNOSIS — K760 Fatty (change of) liver, not elsewhere classified: Secondary | ICD-10-CM | POA: Diagnosis not present

## 2019-01-13 DIAGNOSIS — N390 Urinary tract infection, site not specified: Secondary | ICD-10-CM | POA: Diagnosis not present

## 2019-01-13 DIAGNOSIS — N183 Chronic kidney disease, stage 3 (moderate): Secondary | ICD-10-CM | POA: Diagnosis not present

## 2019-01-13 DIAGNOSIS — Z7984 Long term (current) use of oral hypoglycemic drugs: Secondary | ICD-10-CM | POA: Diagnosis not present

## 2019-01-13 DIAGNOSIS — G8929 Other chronic pain: Secondary | ICD-10-CM | POA: Diagnosis not present

## 2019-01-13 DIAGNOSIS — Z7982 Long term (current) use of aspirin: Secondary | ICD-10-CM | POA: Diagnosis not present

## 2019-01-13 DIAGNOSIS — Z7951 Long term (current) use of inhaled steroids: Secondary | ICD-10-CM | POA: Diagnosis not present

## 2019-01-13 DIAGNOSIS — Z9989 Dependence on other enabling machines and devices: Secondary | ICD-10-CM | POA: Diagnosis not present

## 2019-01-13 DIAGNOSIS — I129 Hypertensive chronic kidney disease with stage 1 through stage 4 chronic kidney disease, or unspecified chronic kidney disease: Secondary | ICD-10-CM | POA: Diagnosis not present

## 2019-01-13 DIAGNOSIS — K219 Gastro-esophageal reflux disease without esophagitis: Secondary | ICD-10-CM | POA: Diagnosis not present

## 2019-01-13 DIAGNOSIS — E1122 Type 2 diabetes mellitus with diabetic chronic kidney disease: Secondary | ICD-10-CM | POA: Diagnosis not present

## 2019-01-13 DIAGNOSIS — C9000 Multiple myeloma not having achieved remission: Secondary | ICD-10-CM | POA: Diagnosis not present

## 2019-01-13 DIAGNOSIS — G4733 Obstructive sleep apnea (adult) (pediatric): Secondary | ICD-10-CM | POA: Diagnosis not present

## 2019-01-13 DIAGNOSIS — E1142 Type 2 diabetes mellitus with diabetic polyneuropathy: Secondary | ICD-10-CM | POA: Diagnosis not present

## 2019-01-13 DIAGNOSIS — M1990 Unspecified osteoarthritis, unspecified site: Secondary | ICD-10-CM | POA: Diagnosis not present

## 2019-01-13 DIAGNOSIS — M5116 Intervertebral disc disorders with radiculopathy, lumbar region: Secondary | ICD-10-CM | POA: Diagnosis not present

## 2019-01-13 DIAGNOSIS — D631 Anemia in chronic kidney disease: Secondary | ICD-10-CM | POA: Diagnosis not present

## 2019-01-13 DIAGNOSIS — E785 Hyperlipidemia, unspecified: Secondary | ICD-10-CM | POA: Diagnosis not present

## 2019-01-13 DIAGNOSIS — Z8673 Personal history of transient ischemic attack (TIA), and cerebral infarction without residual deficits: Secondary | ICD-10-CM | POA: Diagnosis not present

## 2019-01-13 NOTE — Progress Notes (Signed)
Kidney function as show slight improvement over the last few weeks.  Continue current medications and follow up with Dr Moshe Cipro as already scheduled.

## 2019-01-14 ENCOUNTER — Ambulatory Visit (HOSPITAL_COMMUNITY)
Admission: RE | Admit: 2019-01-14 | Discharge: 2019-01-14 | Disposition: A | Discharge status: Home / Self Care | Payer: Medicare Other | Source: Ambulatory Visit | Attending: Orthopedic Surgery | Admitting: Orthopedic Surgery

## 2019-01-14 ENCOUNTER — Other Ambulatory Visit: Payer: Self-pay

## 2019-01-14 DIAGNOSIS — M542 Cervicalgia: Secondary | ICD-10-CM | POA: Diagnosis not present

## 2019-01-15 ENCOUNTER — Encounter: Payer: Self-pay | Admitting: Cardiology

## 2019-01-15 ENCOUNTER — Encounter: Payer: Self-pay | Admitting: Family Medicine

## 2019-01-15 ENCOUNTER — Ambulatory Visit (INDEPENDENT_AMBULATORY_CARE_PROVIDER_SITE_OTHER): Payer: Medicare Other | Admitting: Cardiology

## 2019-01-15 ENCOUNTER — Ambulatory Visit (INDEPENDENT_AMBULATORY_CARE_PROVIDER_SITE_OTHER): Payer: Medicare Other | Admitting: Family Medicine

## 2019-01-15 VITALS — BP 110/77 | HR 98 | Temp 96.6°F | Ht 66.0 in | Wt 199.0 lb

## 2019-01-15 VITALS — BP 112/80 | HR 71 | Temp 97.5°F | Resp 15 | Ht 66.0 in | Wt 204.0 lb

## 2019-01-15 DIAGNOSIS — I10 Essential (primary) hypertension: Secondary | ICD-10-CM

## 2019-01-15 DIAGNOSIS — E785 Hyperlipidemia, unspecified: Secondary | ICD-10-CM

## 2019-01-15 DIAGNOSIS — E1121 Type 2 diabetes mellitus with diabetic nephropathy: Secondary | ICD-10-CM | POA: Diagnosis not present

## 2019-01-15 DIAGNOSIS — R600 Localized edema: Secondary | ICD-10-CM

## 2019-01-15 DIAGNOSIS — F32A Depression, unspecified: Secondary | ICD-10-CM

## 2019-01-15 DIAGNOSIS — F419 Anxiety disorder, unspecified: Secondary | ICD-10-CM

## 2019-01-15 DIAGNOSIS — H02823 Cysts of right eye, unspecified eyelid: Secondary | ICD-10-CM | POA: Diagnosis not present

## 2019-01-15 DIAGNOSIS — F329 Major depressive disorder, single episode, unspecified: Secondary | ICD-10-CM

## 2019-01-15 DIAGNOSIS — F411 Generalized anxiety disorder: Secondary | ICD-10-CM

## 2019-01-15 NOTE — Assessment & Plan Note (Addendum)
Approx 4 week h/o painless right eye cyst, refer to ophthalmology for removal

## 2019-01-15 NOTE — Progress Notes (Signed)
Clinical Summary Cheryl Abbott is a 73 y.o.female seen today for follow up of the following medical problems.This is a focused visit on recent issues with leg edema.   1. Leg edema - 03/2018 echo LVEF 93-57%, indet diastolic function - last visit started lasix 40mg  prn.  - swelling improving but not resolved. Chronic issues with crampin on diuretics historically, has had some recent issues with starting lasix.      Past Medical History:  Diagnosis Date  . Anemia   . Anxiety   . Anxiety disorder   . Arthritis   . Cancer ([place])    acute myeloma  . 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 Chronic back pain   . Depression   . Fatty liver   . GERD (gastroesophageal reflux disease)   . Headache   . History of adenomatous polyp of colon    2008  . History of kidney stones   . Hypercalcemia   . Hyperlipidemia   . Hypertension   . Left ureteral stone   . Lipoma of arm 01/19/2015  . Lumbar disc disease with radiculopathy   . Nephrolithiasis   . Neuropathy, peripheral   . OSA treated with BiPAP    per study 2007  . Sciatica   . Shoulder pain, bilateral   . Sigmoid diverticulosis   . Type 2 diabetes mellitus (HCC)      Allergies  Allergen Reactions  . Ace Inhibitors Cough  . Keflex [Cephalexin] Diarrhea and Nausea And Vomiting  . Nitrofurantoin Diarrhea and Nausea And Vomiting  . Penicillins Hives and Other (See Comments)    Reaction:  Blisters on hands/feet  Has patient had a PCN reaction causing immediate rash, facial/tongue/throat swelling, SOB or lightheadedness with hypotension: No Has patient had a PCN reaction causing severe rash involving mucus membranes or skin necrosis: No Has patient had a PCN reaction that required hospitalization No Has patient had a PCN reaction occurring within the last 10 years: No If all of the above answers are "NO", then may proceed with Cephalosporin use.     Current Outpatient Medications  Medication Sig Dispense Refill   . ALPRAZolam (XANAX) 0.5 MG tablet Take one tablet at bedtime for sleep and anxiety, by mouth (Patient taking differently: Take 0.5 mg by mouth at bedtime. ) 30 tablet 5  . amLODipine (NORVASC) 10 MG tablet TAKE 1 TABLET BY MOUTH EVERY DAY (Patient taking differently: Take 10 mg by mouth daily. ) 90 tablet 3  . aspirin EC 325 MG tablet Take 325 mg by mouth daily.    . calcium-vitamin D (OSCAL WITH D) 500-200 MG-UNIT tablet Take 1 tablet by mouth daily.    . furosemide (LASIX) 40 MG tablet Take 1 tablet (40 mg total) by mouth daily as needed for fluid.    Marland Kitchen loperamide (IMODIUM A-D) 2 MG tablet Take 1 tablet (2 mg total) by mouth 2 (two) times daily as needed for diarrhea or loose stools. 30 tablet 0  . lovastatin (MEVACOR) 40 MG tablet TAKE 1 TABLET BY MOUTH EVERYDAY AT BEDTIME (Patient taking differently: Take 40 mg by mouth at bedtime. ) 90 tablet 1  . Melatonin 10 MG TABS Take 10 mg by mouth at bedtime.    . pantoprazole (PROTONIX) 40 MG tablet TAKE 1 TABLET (40 MG TOTAL) BY MOUTH 2 (TWO) TIMES DAILY. 180 tablet 1  . PARoxetine (PAXIL) 40 MG tablet TAKE 1 TABLET BY MOUTH EVERY DAY IN THE MORNING 90 tablet 0  . potassium  chloride (K-DUR) 10 MEQ tablet Take 1 tablet (10 mEq total) by mouth daily. 3 tablet 0  . SYMBICORT 80-4.5 MCG/ACT inhaler INHALE 2 PUFFS INTO THE LUNGS TWICE A DAY 30.6 Inhaler 1  . tiZANidine (ZANAFLEX) 4 MG tablet Take 1 tablet (4 mg total) by mouth every 6 (six) hours as needed for muscle spasms. 30 tablet 0  . TRADJENTA 5 MG TABS tablet TAKE 1 TABLET BY MOUTH EVERY DAY 90 tablet 1  . traZODone (DESYREL) 100 MG tablet Take 100 mg by mouth at bedtime.     No current facility-administered medications for this visit.      Past Surgical History:  Procedure Laterality Date  . BIOPSY N/A 03/17/2013   Procedure: GASTRIC BIOPSIES;  Surgeon: Danie Binder, MD;  Location: [facility];  Service: Endoscopy;  Laterality: N/A;  . BIOPSY  06/10/2015   Procedure: BIOPSY;  Surgeon: Danie Binder, MD;  [facility];  Service: Endoscopy;;  gastric biopsy  . CARDIAC CATHETERIZATION  05-11-2003  dr Shelva Majestic ([facility])   Abnormal cardiolite/   Normal coronary arteries and normal LVF,  ef  63%  . CARDIOVASCULAR STRESS TEST  01-01-2014   normal lexiscan cardiolite/  no ischemia/ infarct/  normal LV function and wall motion ,  ef 81%  . COLONOSCOPY  last one 10/02/2011   [facility] TICS, IH, NEXT TCS APR 2018  . COLONOSCOPY WITH PROPOFOL N/A 02/26/2017   Procedure: COLONOSCOPY WITH PROPOFOL;  Surgeon: Danie Binder, MD;  [facility];  Service: Endoscopy;  Laterality: N/A;  11:00am  . CYSTO/   RIGHT URETEROSOCOPY LASER LITHOTRIPSY STONE EXTRACTION  10-13-2004  . CYSTO/  RIGHT RETROGRADE PYELOGRAM/  PLACMENT RIGHT URETERAL STENT  01-10-2010  . CYSTOSCOPY W/ URETERAL STENT PLACEMENT Right 08/19/2015   Procedure: CYSTOSCOPY WITH RETROGRADE PYELOGRAM/URETERAL STENT PLACEMENT;  Surgeon: Franchot Gallo, MD;  Location: [facility];  Service: Urology;  Laterality: Right;  . CYSTOSCOPY WITH RETROGRADE PYELOGRAM, URETEROSCOPY AND STENT PLACEMENT Left 10/21/2014   Procedure: CYSTOSCOPY WITH RETROGRADE PYELOGRAM, URETEROSCOPY AND STENT PLACEMENT;  Surgeon: Franchot Gallo, MD;  Location[facility];  Service: Urology;  Laterality: Left;  . CYSTOSCOPY WITH RETROGRADE PYELOGRAM, URETEROSCOPY AND STENT PLACEMENT Right 11/22/2015   Procedure: CYSTOSCOPY, RIGHT URETERAL STENT REMOVAL; RIGHT RETROGRADE PYELOGRAM, RIGHT URETEROSCOPY WITH BALLOON DILATION; RIGHT URETERAL STENT PLACEMENT;  Surgeon: Franchot Gallo, MD;  [facility];  Service: Urology;  Laterality: Right;  . CYSTOSCOPY/RETROGRADE/URETEROSCOPY/STONE EXTRACTION WITH BASKET Bilateral 08/02/2015   Procedure: CYSTOSCOPY; BILATERAL RETROGRADE PYELOGRAMS; BILATERAL URETEROSCOPY, STONE EXTRACTION WITH BASKET;  Surgeon: Franchot Gallo, MD;  [facility];  Service: Urology;  Laterality:  Bilateral;  . CYSTOSCOPY/RETROGRADE/URETEROSCOPY/STONE EXTRACTION WITH BASKET Right 06/28/2015   Procedure: CYSTOSCOPY/RETROGRADE/URETEROSCOPY/STONE EXTRACTION WITH BASKET, RIGHT URETERAL DOUBLE J STENT PLACEMENT;  Surgeon: Franchot Gallo, MD;  [facility];  Service: Urology;  Laterality: Right;  . ESOPHAGOGASTRODUODENOSCOPY (EGD) WITH PROPOFOL N/A 03/17/2013   Procedure: ESOPHAGOGASTRODUODENOSCOPY (EGD) WITH PROPOFOL;  Surgeon: Danie Binder, MD;  [facility];  Service: Endoscopy;  Laterality: N/A;  . ESOPHAGOGASTRODUODENOSCOPY (EGD) WITH PROPOFOL N/A 06/10/2015   Distal gastritis, distal esophageal stricture s/p dilation  . ESOPHAGOGASTRODUODENOSCOPY (EGD) WITH PROPOFOL N/A 02/26/2017   Procedure: ESOPHAGOGASTRODUODENOSCOPY (EGD) WITH PROPOFOL;  Surgeon: Danie Binder, MD;  [facility];  Service: Endoscopy;  Laterality: N/A;  . FLEXIBLE SIGMOIDOSCOPY  09/11/2011   EGB:[id number] Internal hemorrhoids  . HOLMIUM LASER APPLICATION Left 7/37/1062   Procedure: HOLMIUM LASER APPLICATION;  Surgeon:  Franchot Gallo, MD;  Location[facility];  Service: Urology;  Laterality: Left;  . HOLMIUM LASER APPLICATION Right 3/55/7322   Procedure: HOLMIUM LASER APPLICATION;  Surgeon: Franchot Gallo, MD;  [facility];  Service: Urology;  Laterality: Right;  . MASS EXCISION Left 03/04/2015   Procedure: EXCISION OF SOFT TISSUE NEOPLASM LEFT ARM;  Surgeon: Aviva Signs, MD;  Location: [facility];  Service: General;  Laterality: Left;  . PERCUTANEOUS NEPHROSTOLITHOTOMY Bilateral 12/  2011     [facility]  . POLYPECTOMY N/A 03/17/2013   Procedure: GASTRIC POLYPECTOMY;  Surgeon: Danie Binder, MD;  [facility];  Service: Endoscopy;  Laterality: N/A;  . POLYPECTOMY  02/26/2017   Procedure: POLYPECTOMY;  Surgeon: Danie Binder, MD;  [facility];  Service: Endoscopy;;  polyp at cecum, ascending colon polyps x3, hepatic flexure polyps x8, transverse colon polyps x8    . REMOVAL RIGHT THIGH CYST  2006  . SAVORY DILATION N/A 03/17/2013   Procedure: SAVORY DILATION;  Surgeon: Danie Binder, MD;  Location: [facility];  Service: Endoscopy;  Laterality: N/A;  #12.8, 14, 15, 16 dilators used  . SAVORY DILATION N/A 06/10/2015   Procedure: SAVORY DILATION;  Surgeon: Danie Binder, MD;  [facility];  Service: Endoscopy;  Laterality: N/A;  . TRANSTHORACIC ECHOCARDIOGRAM  01-01-2014   mild LVH/  ef 02-54%/  grade I diastolic dysfunction/  trivial MR, TR, and PR  . VAGINAL HYSTERECTOMY  1970's     Allergies  Allergen Reactions  . Ace Inhibitors Cough  . Keflex [Cephalexin] Diarrhea and Nausea And Vomiting  . Nitrofurantoin Diarrhea and Nausea And Vomiting  . Penicillins Hives and Other (See Comments)    Reaction:  Blisters on hands/feet  Has patient had a PCN reaction causing immediate rash, facial/tongue/throat swelling, SOB or lightheadedness with hypotension: No Has patient had a PCN reaction causing severe rash involving mucus membranes or skin necrosis: No Has patient had a PCN reaction that required hospitalization No Has patient had a PCN reaction occurring within the last 10 years: No If all of the above answers are "NO", then may proceed with Cephalosporin use.      Family History  Problem Relation Age of Onset  . Hypertension Mother   . Diabetes Mother   . Heart failure Mother   . Dementia Mother   . Emphysema Father   . Hypertension Father   . Diabetes Brother   . GER disease Brother   . Hypertension Sister   . Hypertension Sister   . Cancer Other        family history   . Diabetes Other        family history   . Heart defect Other        famiily history   . Arthritis Other        family history   . Anesthesia problems Neg Hx   . Hypotension Neg Hx   . Malignant hyperthermia Neg Hx   . Pseudochol deficiency Neg Hx   . Colon cancer Neg Hx      Social History Cheryl Abbott reports that she quit smoking about 30 years  ago. Her smoking use included cigarettes. She started smoking about 44 years ago. She has a 20.00 pack-year smoking history. She has never used smokeless tobacco. Cheryl Abbott reports no history of alcohol use.   Review of Systems CONSTITUTIONAL: No weight loss, fever, chills, weakness or fatigue.  HEENT: Eyes: No visual loss, blurred vision,  double vision or yellow sclerae.No hearing loss, sneezing, congestion, runny nose or sore throat.  SKIN: No rash or itching.  CARDIOVASCULAR: per hpi RESPIRATORY: No shortness of breath, cough or sputum.  GASTROINTESTINAL: No anorexia, nausea, vomiting or diarrhea. No abdominal pain or blood.  GENITOURINARY: No burning on urination, no polyuria NEUROLOGICAL: No headache, dizziness, syncope, paralysis, ataxia, numbness or tingling in the extremities. No change in bowel or bladder control.  MUSCULOSKELETAL: No muscle, back pain, joint pain or stiffness.  LYMPHATICS: No enlarged nodes. No history of splenectomy.  PSYCHIATRIC: No history of depression or anxiety.  ENDOCRINOLOGIC: No reports of sweating, cold or heat intolerance. No polyuria or polydipsia.  Marland Kitchen   Physical Examination Today's Vitals   01/15/19 0841  BP: 110/77  Pulse: 98  Temp: (!) 96.6 F (35.9 C)  TempSrc: Temporal  SpO2: 94%  Weight: 199 lb (90.3 kg)  Height: 5\' 6"  (1.676 m)   Body mass index is 32.12 kg/m.  Gen: resting comfortably, no acute distress HEENT: no scleral icterus, pupils equal round and reactive, no palptable cervical adenopathy,  CV: RRR, no m/r/g, no jvd Resp: Clear to auscultation bilaterally GI: abdomen is soft, non-tender, non-distended, normal bowel sounds, no hepatosplenomegaly MSK: extremities are warm, trace bilateral edema Skin: warm, no rash Neuro:  no focal deficits Psych: appropriate affect   Diagnostic Studies  03/2018 echo Study Conclusions  - Left ventricle: The cavity size was normal. Wall thickness was increased in a pattern of  mild LVH. Systolic function was normal. The estimated ejection fraction was in the range of 55% to 60%. Wall motion was normal; there were no regional wall motion abnormalities. Indeterminate diastolic function. - Right ventricle: The cavity size was mildly dilated. - Right atrium: Central venous pressure (est): 3 mm Hg. - Atrial septum: No defect or patent foramen ovale was identified. - Tricuspid valve: There was trivial regurgitation. - Pulmonary arteries: PA peak pressure: 21 mm Hg (S). - Pericardium, extracardiac: There was no pericardial effusion.   Assessment and Plan  1. LE edema - mild R>L edema, nonpitting. Her echo has been fairly benign, perhaps some mild diastolic dysfunction. I think her chronic swelling is more related to venous insufficiency and obesity, no signs of significant HF - continue prn lasix. She reports too difficult to get compression socks on      Arnoldo Lenis, M.D.

## 2019-01-15 NOTE — Patient Instructions (Signed)

## 2019-01-15 NOTE — Patient Instructions (Addendum)
Please schedule mammogram, Nov 23 or after  Annual physical exam, no pap,  with MD first week in October.call if you need me before. Flu vaccine at visit  HBA1C,  Fasting lipid, cmp anhd eGFR, TSH and microalb , sept 15 or after  It is important that you exercise regularly at least 30 minutes 5 times a week. If you develop chest pain, have severe difficulty breathing, or feel very tired, stop exercising immediately and seek medical attention  Thanks for choosing  [facility], we consider it a privelige to serve you.

## 2019-01-15 NOTE — Progress Notes (Signed)
Cheryl Abbott     MRN: [id number]      DOB: 08-10-1945   HPI Cheryl Abbott is here for follow up and re-evaluation of chronic medical conditions, medication management and review of any available recent lab and radiology data.  Preventive health is updated, specifically  Cancer screening and Immunization.   Questions or concerns regarding consultations or procedures which the PT has had in the interim are  addressed. The PT denies any adverse reactions to current medications since the last visit.  Right eye cyst upper lid needs removal, requests referral for this  ROS Denies recent fever or chills. Denies sinus pressure, nasal congestion, ear pain or sore throat. Denies chest congestion, productive cough or wheezing. c/o lowerDenies abdominal pain, nausea, vomiting,diarrhea or constipation.   Denies dysuria, frequency, hesitancy or incontinence. Denies joint pain, swelling and limitation in mobility. Denies headaches, seizures, numbness, or tingling. Denies depression, anxiety or insomnia. Denies skin break down or rash.   PE  BP 112/80   Pulse 71   Temp (!) 97.5 F (36.4 C) (Temporal)   Resp 15   Ht 5\' 6"  (1.676 m)   Wt 204 lb (92.5 kg)   SpO2 96%   BMI 32.93 kg/m   Patient alert and oriented and in no cardiopulmonary distress.  HEENT: No facial asymmetry, EOMI,   oropharynx pink and moist.  Neck supple no JVD, no mass.  Chest: Clear to auscultation bilaterally.  CVS: S1, S2 no murmurs, no S3.Regular rate.  ABD: Soft non tender.   Ext: No edema  MS: Adequate ROM spine, shoulders, hips and knees.  Skin: Intact, no ulcerations or rash noted.  Psych: Good eye contact, normal affect. Memory intact not anxious or depressed appearing.  CNS: CN 2-12 intact, power,  normal throughout.no focal deficits noted.   Assessment & Plan  Eyelid cyst, right Approx 4 week h/o painless right eye cyst, refer to ophthalmology for removal  Hypertension goal BP (blood  pressure) < 130/80 Controlled, no change in medication DASH diet and commitment to daily physical activity for a minimum of 30 minutes discussed and encouraged, as a part of hypertension management. The importance of attaining a healthy weight is also discussed.  BP/Weight 01/19/2019 01/15/2019 01/15/2019 01/08/2019 01/05/2019 01/02/2019 1/76/1607  Systolic BP 371 062 694 - 854 - 627  Diastolic BP 57 80 77 - 87 - 77  Wt. (Lbs) 200 204 199 200 - 205 200  BMI 32.28 32.93 32.12 32.28 - 33.09 32.28       Hyperlipidemia LDL goal <100 Hyperlipidemia:Low fat diet discussed and encouraged.   Lipid Panel  Lab Results  Component Value Date   CHOL 162 11/19/2018   HDL 47 11/19/2018   LDLCALC 91 11/19/2018   TRIG 121 11/19/2018   CHOLHDL 3.4 11/19/2018   Controlled, no change in medication     Generalized anxiety disorder Controlled, no change in medication   Anxiety and depression Controlled, no change in medication   Type 2 diabetes with nephropathy ([place]) Cheryl Abbott is reminded of the importance of commitment to daily physical activity for 30 minutes or more, as able and the need to limit carbohydrate intake to 30 to 60 grams per meal to help with blood sugar control.   The need to take medication as prescribed, test blood sugar as directed, and to call between visits if there is a concern that blood sugar is uncontrolled is also discussed.   Cheryl Abbott is reminded of the importance of daily foot  exam, annual eye examination, and good blood sugar, blood pressure and cholesterol control.  Diabetic Labs Latest Ref Rng & Units 01/09/2019 01/05/2019 01/03/2019 01/02/2019 11/19/2018  HbA1c 4.8 - 5.6 % - - - - 7.0(H)  Microalbumin mg/dL - - - - -  Micro/Creat Ratio 0.0 - 30.0 mg/g - - - - -  Chol 0 - 200 mg/dL - - - - 162  HDL >40 mg/dL - - - - 47  Calc LDL 0 - 99 mg/dL - - - - 91  Triglycerides <150 mg/dL - - - - 121  Creatinine 0.60 - 0.93 mg/dL 1.49(H) 1.47(H) 1.61(H) 2.22(H) 1.55(H)    BP/Weight 01/19/2019 01/15/2019 01/15/2019 01/08/2019 01/05/2019 01/02/2019 0/76/8088  Systolic BP 110 315 945 - 859 - 292  Diastolic BP 57 80 77 - 87 - 77  Wt. (Lbs) 200 204 199 200 - 205 200  BMI 32.28 32.93 32.12 32.28 - 33.09 32.28   Foot/eye exam completion dates Latest Ref Rng & Units 12/24/2018 05/19/2018  Eye Exam No Retinopathy - -  Foot exam Order - - -  Foot Form Completion - Done Done  Updated lab needed at/ before next visit. Controlled, no change in medication

## 2019-01-16 ENCOUNTER — Telehealth: Payer: Self-pay | Admitting: Orthopedic Surgery

## 2019-01-16 DIAGNOSIS — Z7984 Long term (current) use of oral hypoglycemic drugs: Secondary | ICD-10-CM | POA: Diagnosis not present

## 2019-01-16 DIAGNOSIS — G8929 Other chronic pain: Secondary | ICD-10-CM | POA: Diagnosis not present

## 2019-01-16 DIAGNOSIS — M5116 Intervertebral disc disorders with radiculopathy, lumbar region: Secondary | ICD-10-CM | POA: Diagnosis not present

## 2019-01-16 DIAGNOSIS — D631 Anemia in chronic kidney disease: Secondary | ICD-10-CM | POA: Diagnosis not present

## 2019-01-16 DIAGNOSIS — C9000 Multiple myeloma not having achieved remission: Secondary | ICD-10-CM | POA: Diagnosis not present

## 2019-01-16 DIAGNOSIS — I129 Hypertensive chronic kidney disease with stage 1 through stage 4 chronic kidney disease, or unspecified chronic kidney disease: Secondary | ICD-10-CM | POA: Diagnosis not present

## 2019-01-16 DIAGNOSIS — Z87891 Personal history of nicotine dependence: Secondary | ICD-10-CM | POA: Diagnosis not present

## 2019-01-16 DIAGNOSIS — Z7951 Long term (current) use of inhaled steroids: Secondary | ICD-10-CM | POA: Diagnosis not present

## 2019-01-16 DIAGNOSIS — G4733 Obstructive sleep apnea (adult) (pediatric): Secondary | ICD-10-CM | POA: Diagnosis not present

## 2019-01-16 DIAGNOSIS — N183 Chronic kidney disease, stage 3 (moderate): Secondary | ICD-10-CM | POA: Diagnosis not present

## 2019-01-16 DIAGNOSIS — E785 Hyperlipidemia, unspecified: Secondary | ICD-10-CM | POA: Diagnosis not present

## 2019-01-16 DIAGNOSIS — E1122 Type 2 diabetes mellitus with diabetic chronic kidney disease: Secondary | ICD-10-CM | POA: Diagnosis not present

## 2019-01-16 DIAGNOSIS — Z7982 Long term (current) use of aspirin: Secondary | ICD-10-CM | POA: Diagnosis not present

## 2019-01-16 DIAGNOSIS — K5730 Diverticulosis of large intestine without perforation or abscess without bleeding: Secondary | ICD-10-CM | POA: Diagnosis not present

## 2019-01-16 DIAGNOSIS — Z79899 Other long term (current) drug therapy: Secondary | ICD-10-CM | POA: Diagnosis not present

## 2019-01-16 DIAGNOSIS — N390 Urinary tract infection, site not specified: Secondary | ICD-10-CM | POA: Diagnosis not present

## 2019-01-16 DIAGNOSIS — Z8673 Personal history of transient ischemic attack (TIA), and cerebral infarction without residual deficits: Secondary | ICD-10-CM | POA: Diagnosis not present

## 2019-01-16 DIAGNOSIS — K760 Fatty (change of) liver, not elsewhere classified: Secondary | ICD-10-CM | POA: Diagnosis not present

## 2019-01-16 DIAGNOSIS — M1990 Unspecified osteoarthritis, unspecified site: Secondary | ICD-10-CM | POA: Diagnosis not present

## 2019-01-16 DIAGNOSIS — Z9989 Dependence on other enabling machines and devices: Secondary | ICD-10-CM | POA: Diagnosis not present

## 2019-01-16 DIAGNOSIS — K219 Gastro-esophageal reflux disease without esophagitis: Secondary | ICD-10-CM | POA: Diagnosis not present

## 2019-01-16 DIAGNOSIS — E1142 Type 2 diabetes mellitus with diabetic polyneuropathy: Secondary | ICD-10-CM | POA: Diagnosis not present

## 2019-01-16 NOTE — Telephone Encounter (Signed)
Results relayed to [name]  I will set her up to see Dr. Vertell Limber for further management and treatment   CLINICAL DATA:  Neck pain  EXAM: MRI CERVICAL SPINE WITHOUT CONTRAST  TECHNIQUE: Multiplanar, multisequence MR imaging of the cervical spine was performed. No intravenous contrast was administered.  COMPARISON:  None.  FINDINGS: Alignment: Physiologic  Vertebrae: The vertebral body heights are well maintained. No fracture, marrow edema,or pathologic marrow infiltration.  Cord: Normal signal and morphology.  Posterior Fossa, vertebral arteries, paraspinal tissues:  The visualized portion of the posterior fossa is unremarkable. Normal flow voids seen within the vertebral arteries. The paraspinal soft tissues are unremarkable.  Disc levels:  C1-C2: Atlanto-axial junction is normal, without canal narrowing  C2-C3: No significant spinal canal or neural foraminal narrowing  C3-C4: There is uncovertebral osteophytes which causes severe left neural foraminal narrowing.  C4-C5: Uncovertebral osteophytes are present which causes mild right neural foraminal narrowing.  C5-C6: There is a minimal disc osteophyte complex and uncovertebral osteophytes which causes moderate right and mild left neural foraminal narrowing.  C6-C7: There is a minimal disc osteophyte complex chest is mild bilateral neural foraminal narrowing.  C7-T1: No significant spinal canal or neural foraminal narrowing  IMPRESSION: 1. Cervical spine spondylosis most notable C3-C4 with severe left neural foraminal narrowing. No significant canal stenosis is seen.   Electronically Signed   By: Prudencio Pair M.D.   On: 01/14/2019 17:21

## 2019-01-16 NOTE — Telephone Encounter (Signed)
results

## 2019-01-17 DIAGNOSIS — Z7951 Long term (current) use of inhaled steroids: Secondary | ICD-10-CM | POA: Diagnosis not present

## 2019-01-17 DIAGNOSIS — Z7982 Long term (current) use of aspirin: Secondary | ICD-10-CM | POA: Diagnosis not present

## 2019-01-17 DIAGNOSIS — D631 Anemia in chronic kidney disease: Secondary | ICD-10-CM | POA: Diagnosis not present

## 2019-01-17 DIAGNOSIS — N390 Urinary tract infection, site not specified: Secondary | ICD-10-CM | POA: Diagnosis not present

## 2019-01-17 DIAGNOSIS — E1122 Type 2 diabetes mellitus with diabetic chronic kidney disease: Secondary | ICD-10-CM | POA: Diagnosis not present

## 2019-01-17 DIAGNOSIS — K5730 Diverticulosis of large intestine without perforation or abscess without bleeding: Secondary | ICD-10-CM | POA: Diagnosis not present

## 2019-01-17 DIAGNOSIS — Z87891 Personal history of nicotine dependence: Secondary | ICD-10-CM | POA: Diagnosis not present

## 2019-01-17 DIAGNOSIS — G4733 Obstructive sleep apnea (adult) (pediatric): Secondary | ICD-10-CM | POA: Diagnosis not present

## 2019-01-17 DIAGNOSIS — Z8673 Personal history of transient ischemic attack (TIA), and cerebral infarction without residual deficits: Secondary | ICD-10-CM | POA: Diagnosis not present

## 2019-01-17 DIAGNOSIS — Z79899 Other long term (current) drug therapy: Secondary | ICD-10-CM | POA: Diagnosis not present

## 2019-01-17 DIAGNOSIS — E785 Hyperlipidemia, unspecified: Secondary | ICD-10-CM | POA: Diagnosis not present

## 2019-01-17 DIAGNOSIS — Z9989 Dependence on other enabling machines and devices: Secondary | ICD-10-CM | POA: Diagnosis not present

## 2019-01-17 DIAGNOSIS — E1142 Type 2 diabetes mellitus with diabetic polyneuropathy: Secondary | ICD-10-CM | POA: Diagnosis not present

## 2019-01-17 DIAGNOSIS — C9000 Multiple myeloma not having achieved remission: Secondary | ICD-10-CM | POA: Diagnosis not present

## 2019-01-17 DIAGNOSIS — N183 Chronic kidney disease, stage 3 (moderate): Secondary | ICD-10-CM | POA: Diagnosis not present

## 2019-01-17 DIAGNOSIS — Z7984 Long term (current) use of oral hypoglycemic drugs: Secondary | ICD-10-CM | POA: Diagnosis not present

## 2019-01-17 DIAGNOSIS — G8929 Other chronic pain: Secondary | ICD-10-CM | POA: Diagnosis not present

## 2019-01-17 DIAGNOSIS — I129 Hypertensive chronic kidney disease with stage 1 through stage 4 chronic kidney disease, or unspecified chronic kidney disease: Secondary | ICD-10-CM | POA: Diagnosis not present

## 2019-01-17 DIAGNOSIS — K219 Gastro-esophageal reflux disease without esophagitis: Secondary | ICD-10-CM | POA: Diagnosis not present

## 2019-01-17 DIAGNOSIS — M5116 Intervertebral disc disorders with radiculopathy, lumbar region: Secondary | ICD-10-CM | POA: Diagnosis not present

## 2019-01-17 DIAGNOSIS — K760 Fatty (change of) liver, not elsewhere classified: Secondary | ICD-10-CM | POA: Diagnosis not present

## 2019-01-17 DIAGNOSIS — M1990 Unspecified osteoarthritis, unspecified site: Secondary | ICD-10-CM | POA: Diagnosis not present

## 2019-01-18 DIAGNOSIS — R0902 Hypoxemia: Secondary | ICD-10-CM | POA: Diagnosis not present

## 2019-01-19 ENCOUNTER — Telehealth: Payer: Self-pay | Admitting: *Deleted

## 2019-01-19 ENCOUNTER — Emergency Department (HOSPITAL_COMMUNITY)
Admission: EM | Admit: 2019-01-19 | Discharge: 2019-01-19 | Disposition: A | Discharge status: Home / Self Care | Payer: Medicare Other | Attending: Emergency Medicine | Admitting: Emergency Medicine

## 2019-01-19 ENCOUNTER — Other Ambulatory Visit: Payer: Self-pay

## 2019-01-19 ENCOUNTER — Emergency Department (HOSPITAL_COMMUNITY): Payer: Medicare Other

## 2019-01-19 ENCOUNTER — Encounter (HOSPITAL_COMMUNITY): Payer: Self-pay | Admitting: Emergency Medicine

## 2019-01-19 DIAGNOSIS — N3000 Acute cystitis without hematuria: Secondary | ICD-10-CM | POA: Diagnosis not present

## 2019-01-19 DIAGNOSIS — W19XXXA Unspecified fall, initial encounter: Secondary | ICD-10-CM | POA: Diagnosis not present

## 2019-01-19 DIAGNOSIS — M549 Dorsalgia, unspecified: Secondary | ICD-10-CM | POA: Diagnosis not present

## 2019-01-19 DIAGNOSIS — S3992XA Unspecified injury of lower back, initial encounter: Secondary | ICD-10-CM | POA: Diagnosis not present

## 2019-01-19 DIAGNOSIS — Z8673 Personal history of transient ischemic attack (TIA), and cerebral infarction without residual deficits: Secondary | ICD-10-CM | POA: Diagnosis not present

## 2019-01-19 DIAGNOSIS — I129 Hypertensive chronic kidney disease with stage 1 through stage 4 chronic kidney disease, or unspecified chronic kidney disease: Secondary | ICD-10-CM | POA: Insufficient documentation

## 2019-01-19 DIAGNOSIS — E1122 Type 2 diabetes mellitus with diabetic chronic kidney disease: Secondary | ICD-10-CM | POA: Insufficient documentation

## 2019-01-19 DIAGNOSIS — Z87891 Personal history of nicotine dependence: Secondary | ICD-10-CM | POA: Diagnosis not present

## 2019-01-19 DIAGNOSIS — N183 Chronic kidney disease, stage 3 (moderate): Secondary | ICD-10-CM | POA: Diagnosis not present

## 2019-01-19 DIAGNOSIS — Z7982 Long term (current) use of aspirin: Secondary | ICD-10-CM | POA: Insufficient documentation

## 2019-01-19 DIAGNOSIS — Z79899 Other long term (current) drug therapy: Secondary | ICD-10-CM | POA: Diagnosis not present

## 2019-01-19 DIAGNOSIS — M545 Low back pain: Secondary | ICD-10-CM | POA: Diagnosis not present

## 2019-01-19 LAB — URINALYSIS, ROUTINE W REFLEX MICROSCOPIC
Bilirubin Urine: NEGATIVE
Glucose, UA: NEGATIVE mg/dL
Ketones, ur: NEGATIVE mg/dL
Nitrite: NEGATIVE
Protein, ur: 30 mg/dL — AB
Specific Gravity, Urine: 1.01 (ref 1.005–1.030)
WBC, UA: >50 WBC/hpf — ABNORMAL HIGH (ref 0–5)
pH: 6 (ref 5.0–8.0)

## 2019-01-19 MED ORDER — OXYCODONE-ACETAMINOPHEN 5-325 MG PO TABS
1.0000 | ORAL_TABLET | Freq: Once | ORAL | Status: AC
  Administered 2019-01-19: 19:00:00 1 via ORAL
  Filled 2019-01-19: qty 1

## 2019-01-19 MED ORDER — CIPROFLOXACIN HCL 500 MG PO TABS: 500.0000 mg | ORAL_TABLET | Freq: Two times a day (BID) | ORAL | 0 refills | Status: DC

## 2019-01-19 MED ORDER — CIPROFLOXACIN HCL 250 MG PO TABS
500.0000 mg | ORAL_TABLET | Freq: Once | ORAL | Status: AC
  Administered 2019-01-19: 500 mg via ORAL
  Filled 2019-01-19: qty 2

## 2019-01-19 NOTE — ED Triage Notes (Signed)
Patient c/o incontinence and burning since the fall.

## 2019-01-19 NOTE — Telephone Encounter (Signed)
Noted, thanks!

## 2019-01-19 NOTE — Telephone Encounter (Signed)
Spoke with patient and let her know Dr.Simpson's recommendations with verbal understanding. Gave her the address of the [facility] on [address] is. She stated she would go there so that Dr.Simpson would be able to see the notes.

## 2019-01-19 NOTE — Telephone Encounter (Signed)
Tressia Danas physical therapist with [facility] called wanted to let Dr. Moshe Cipro know that Ms. Lal fell. She was assisted up by EMS they checked her over she seemed fine. He went to see her and she was fine however she was complaining of lower leg pain and back pain. He recommended she go to ER to be checked out but she refused to go. Just wanted to let Dr. Moshe Cipro know. He can be reached at [phone]

## 2019-01-19 NOTE — ED Triage Notes (Signed)
Patient states she fell on Friday. C/O hurting all over. Patient states she thinks her "kidneys were hurt." Patient was unable to get up by herself.

## 2019-01-19 NOTE — Telephone Encounter (Signed)
pls contact her and recommend urgent car eval or office visit with [place] re fall

## 2019-01-20 ENCOUNTER — Other Ambulatory Visit: Payer: Self-pay | Admitting: Orthopedic Surgery

## 2019-01-20 ENCOUNTER — Telehealth: Payer: Self-pay | Admitting: Orthopedic Surgery

## 2019-01-20 DIAGNOSIS — M542 Cervicalgia: Secondary | ICD-10-CM

## 2019-01-20 NOTE — Telephone Encounter (Signed)
Will you check last note or ask Dr Aline Brochure about referral to Dr Vertell Limber for this patient?  I did not see one for her.  Ms. Kontos called today asking if we had gotten her an appointment yet.  Thanks

## 2019-01-20 NOTE — Telephone Encounter (Addendum)
Yes, he had me send, I just sent it today He sent me a staff message. I called patient to give her the number to call, but no one answered, rang and rang and rang and rang no voicemail

## 2019-01-21 ENCOUNTER — Other Ambulatory Visit: Payer: Self-pay

## 2019-01-21 DIAGNOSIS — I129 Hypertensive chronic kidney disease with stage 1 through stage 4 chronic kidney disease, or unspecified chronic kidney disease: Secondary | ICD-10-CM | POA: Diagnosis not present

## 2019-01-21 DIAGNOSIS — C9000 Multiple myeloma not having achieved remission: Secondary | ICD-10-CM | POA: Diagnosis not present

## 2019-01-21 DIAGNOSIS — D631 Anemia in chronic kidney disease: Secondary | ICD-10-CM | POA: Diagnosis not present

## 2019-01-21 DIAGNOSIS — E785 Hyperlipidemia, unspecified: Secondary | ICD-10-CM | POA: Diagnosis not present

## 2019-01-21 DIAGNOSIS — Z7982 Long term (current) use of aspirin: Secondary | ICD-10-CM | POA: Diagnosis not present

## 2019-01-21 DIAGNOSIS — N390 Urinary tract infection, site not specified: Secondary | ICD-10-CM | POA: Diagnosis not present

## 2019-01-21 DIAGNOSIS — N183 Chronic kidney disease, stage 3 (moderate): Secondary | ICD-10-CM | POA: Diagnosis not present

## 2019-01-21 DIAGNOSIS — K760 Fatty (change of) liver, not elsewhere classified: Secondary | ICD-10-CM | POA: Diagnosis not present

## 2019-01-21 DIAGNOSIS — Z9989 Dependence on other enabling machines and devices: Secondary | ICD-10-CM | POA: Diagnosis not present

## 2019-01-21 DIAGNOSIS — Z87891 Personal history of nicotine dependence: Secondary | ICD-10-CM | POA: Diagnosis not present

## 2019-01-21 DIAGNOSIS — E1122 Type 2 diabetes mellitus with diabetic chronic kidney disease: Secondary | ICD-10-CM | POA: Diagnosis not present

## 2019-01-21 DIAGNOSIS — Z7984 Long term (current) use of oral hypoglycemic drugs: Secondary | ICD-10-CM | POA: Diagnosis not present

## 2019-01-21 DIAGNOSIS — M1990 Unspecified osteoarthritis, unspecified site: Secondary | ICD-10-CM | POA: Diagnosis not present

## 2019-01-21 DIAGNOSIS — Z79899 Other long term (current) drug therapy: Secondary | ICD-10-CM | POA: Diagnosis not present

## 2019-01-21 DIAGNOSIS — K5730 Diverticulosis of large intestine without perforation or abscess without bleeding: Secondary | ICD-10-CM | POA: Diagnosis not present

## 2019-01-21 DIAGNOSIS — Z8673 Personal history of transient ischemic attack (TIA), and cerebral infarction without residual deficits: Secondary | ICD-10-CM | POA: Diagnosis not present

## 2019-01-21 DIAGNOSIS — K219 Gastro-esophageal reflux disease without esophagitis: Secondary | ICD-10-CM | POA: Diagnosis not present

## 2019-01-21 DIAGNOSIS — G4733 Obstructive sleep apnea (adult) (pediatric): Secondary | ICD-10-CM | POA: Diagnosis not present

## 2019-01-21 DIAGNOSIS — G8929 Other chronic pain: Secondary | ICD-10-CM | POA: Diagnosis not present

## 2019-01-21 DIAGNOSIS — E1142 Type 2 diabetes mellitus with diabetic polyneuropathy: Secondary | ICD-10-CM | POA: Diagnosis not present

## 2019-01-21 DIAGNOSIS — M5116 Intervertebral disc disorders with radiculopathy, lumbar region: Secondary | ICD-10-CM | POA: Diagnosis not present

## 2019-01-21 DIAGNOSIS — Z7951 Long term (current) use of inhaled steroids: Secondary | ICD-10-CM | POA: Diagnosis not present

## 2019-01-21 LAB — URINE CULTURE: Culture: NO GROWTH

## 2019-01-21 NOTE — Patient Outreach (Signed)
[facility]) Care Management  01/21/2019  Cheryl Abbott Nov 05, 1945 [id number]   Referral Date: 01/21/2019 Referral Source: [facility] referral Referral Reason: Recent ED visit, needs neck and eye surgery, and lives alone.   Outreach Attempt: no answer. Unable to leave message.    Plan: RN CM will attempt patient within 4 business days and send letter.     Jone Baseman, RN, MSN [facility] Care Management Care Management Coordinator Direct Line [phone] Toll Free: [phone]  Fax: [phone]

## 2019-01-23 DIAGNOSIS — Z7982 Long term (current) use of aspirin: Secondary | ICD-10-CM | POA: Diagnosis not present

## 2019-01-23 DIAGNOSIS — K219 Gastro-esophageal reflux disease without esophagitis: Secondary | ICD-10-CM | POA: Diagnosis not present

## 2019-01-23 DIAGNOSIS — Z9989 Dependence on other enabling machines and devices: Secondary | ICD-10-CM | POA: Diagnosis not present

## 2019-01-23 DIAGNOSIS — Z8673 Personal history of transient ischemic attack (TIA), and cerebral infarction without residual deficits: Secondary | ICD-10-CM | POA: Diagnosis not present

## 2019-01-23 DIAGNOSIS — M1990 Unspecified osteoarthritis, unspecified site: Secondary | ICD-10-CM | POA: Diagnosis not present

## 2019-01-23 DIAGNOSIS — N183 Chronic kidney disease, stage 3 (moderate): Secondary | ICD-10-CM | POA: Diagnosis not present

## 2019-01-23 DIAGNOSIS — M5116 Intervertebral disc disorders with radiculopathy, lumbar region: Secondary | ICD-10-CM | POA: Diagnosis not present

## 2019-01-23 DIAGNOSIS — Z79899 Other long term (current) drug therapy: Secondary | ICD-10-CM | POA: Diagnosis not present

## 2019-01-23 DIAGNOSIS — E1122 Type 2 diabetes mellitus with diabetic chronic kidney disease: Secondary | ICD-10-CM | POA: Diagnosis not present

## 2019-01-23 DIAGNOSIS — Z87891 Personal history of nicotine dependence: Secondary | ICD-10-CM | POA: Diagnosis not present

## 2019-01-23 DIAGNOSIS — D631 Anemia in chronic kidney disease: Secondary | ICD-10-CM | POA: Diagnosis not present

## 2019-01-23 DIAGNOSIS — G4733 Obstructive sleep apnea (adult) (pediatric): Secondary | ICD-10-CM | POA: Diagnosis not present

## 2019-01-23 DIAGNOSIS — E785 Hyperlipidemia, unspecified: Secondary | ICD-10-CM | POA: Diagnosis not present

## 2019-01-23 DIAGNOSIS — Z7984 Long term (current) use of oral hypoglycemic drugs: Secondary | ICD-10-CM | POA: Diagnosis not present

## 2019-01-23 DIAGNOSIS — K5730 Diverticulosis of large intestine without perforation or abscess without bleeding: Secondary | ICD-10-CM | POA: Diagnosis not present

## 2019-01-23 DIAGNOSIS — N390 Urinary tract infection, site not specified: Secondary | ICD-10-CM | POA: Diagnosis not present

## 2019-01-23 DIAGNOSIS — E1142 Type 2 diabetes mellitus with diabetic polyneuropathy: Secondary | ICD-10-CM | POA: Diagnosis not present

## 2019-01-23 DIAGNOSIS — Z7951 Long term (current) use of inhaled steroids: Secondary | ICD-10-CM | POA: Diagnosis not present

## 2019-01-23 DIAGNOSIS — G8929 Other chronic pain: Secondary | ICD-10-CM | POA: Diagnosis not present

## 2019-01-23 DIAGNOSIS — C9000 Multiple myeloma not having achieved remission: Secondary | ICD-10-CM | POA: Diagnosis not present

## 2019-01-23 DIAGNOSIS — K760 Fatty (change of) liver, not elsewhere classified: Secondary | ICD-10-CM | POA: Diagnosis not present

## 2019-01-23 DIAGNOSIS — I129 Hypertensive chronic kidney disease with stage 1 through stage 4 chronic kidney disease, or unspecified chronic kidney disease: Secondary | ICD-10-CM | POA: Diagnosis not present

## 2019-01-26 ENCOUNTER — Other Ambulatory Visit: Payer: Self-pay | Admitting: Family Medicine

## 2019-01-26 ENCOUNTER — Other Ambulatory Visit: Payer: Self-pay

## 2019-01-26 ENCOUNTER — Telehealth: Payer: Self-pay

## 2019-01-26 ENCOUNTER — Telehealth: Payer: Self-pay | Admitting: *Deleted

## 2019-01-26 NOTE — Telephone Encounter (Signed)
Cheryl Abbott with [facility] was calling to get orders for [facility]. She wanted to get two additional visits as she is on antibiotic for UTI. These can be verbal orders. She can be reached at [phone]

## 2019-01-26 NOTE — Patient Outreach (Signed)
[facility]) Care Management  01/26/2019  JULI ODOM 09-09-1945 [id number]   Referral Date: 01/21/2019 Referral Source: [facility] referral Referral Reason: Recent ED visit, needs neck and eye surgery, and lives alone.   Outreach Attempt:  no answer.  HIPAA compliant voice message left.  Plan: RN CM will attempt again within 4 business days.  Jone Baseman, RN, MSN [place] Management Care Management Coordinator Direct Line [phone] Cell [phone] Toll Free: [phone]  Fax: [phone]

## 2019-01-26 NOTE — Telephone Encounter (Signed)
Patient left message stating she needs neck surgery, but she doesn't know where she is to go or what to do. I tried calling her to explain, but it went straight to voicemail. Dr. Aline Brochure referred her to Dr. Vertell Limber [phone].

## 2019-01-27 NOTE — Telephone Encounter (Signed)
Verbal orders given  

## 2019-01-28 ENCOUNTER — Other Ambulatory Visit: Payer: Self-pay

## 2019-01-28 DIAGNOSIS — Z87891 Personal history of nicotine dependence: Secondary | ICD-10-CM | POA: Diagnosis not present

## 2019-01-28 DIAGNOSIS — K5730 Diverticulosis of large intestine without perforation or abscess without bleeding: Secondary | ICD-10-CM | POA: Diagnosis not present

## 2019-01-28 DIAGNOSIS — Z7984 Long term (current) use of oral hypoglycemic drugs: Secondary | ICD-10-CM | POA: Diagnosis not present

## 2019-01-28 DIAGNOSIS — Z79899 Other long term (current) drug therapy: Secondary | ICD-10-CM | POA: Diagnosis not present

## 2019-01-28 DIAGNOSIS — Z7982 Long term (current) use of aspirin: Secondary | ICD-10-CM | POA: Diagnosis not present

## 2019-01-28 DIAGNOSIS — K760 Fatty (change of) liver, not elsewhere classified: Secondary | ICD-10-CM | POA: Diagnosis not present

## 2019-01-28 DIAGNOSIS — G8929 Other chronic pain: Secondary | ICD-10-CM | POA: Diagnosis not present

## 2019-01-28 DIAGNOSIS — N390 Urinary tract infection, site not specified: Secondary | ICD-10-CM | POA: Diagnosis not present

## 2019-01-28 DIAGNOSIS — D631 Anemia in chronic kidney disease: Secondary | ICD-10-CM | POA: Diagnosis not present

## 2019-01-28 DIAGNOSIS — M1990 Unspecified osteoarthritis, unspecified site: Secondary | ICD-10-CM | POA: Diagnosis not present

## 2019-01-28 DIAGNOSIS — E785 Hyperlipidemia, unspecified: Secondary | ICD-10-CM | POA: Diagnosis not present

## 2019-01-28 DIAGNOSIS — Z8673 Personal history of transient ischemic attack (TIA), and cerebral infarction without residual deficits: Secondary | ICD-10-CM | POA: Diagnosis not present

## 2019-01-28 DIAGNOSIS — C9000 Multiple myeloma not having achieved remission: Secondary | ICD-10-CM | POA: Diagnosis not present

## 2019-01-28 DIAGNOSIS — E1122 Type 2 diabetes mellitus with diabetic chronic kidney disease: Secondary | ICD-10-CM | POA: Diagnosis not present

## 2019-01-28 DIAGNOSIS — Z7951 Long term (current) use of inhaled steroids: Secondary | ICD-10-CM | POA: Diagnosis not present

## 2019-01-28 DIAGNOSIS — E1142 Type 2 diabetes mellitus with diabetic polyneuropathy: Secondary | ICD-10-CM | POA: Diagnosis not present

## 2019-01-28 DIAGNOSIS — I129 Hypertensive chronic kidney disease with stage 1 through stage 4 chronic kidney disease, or unspecified chronic kidney disease: Secondary | ICD-10-CM | POA: Diagnosis not present

## 2019-01-28 DIAGNOSIS — G4733 Obstructive sleep apnea (adult) (pediatric): Secondary | ICD-10-CM | POA: Diagnosis not present

## 2019-01-28 DIAGNOSIS — N183 Chronic kidney disease, stage 3 (moderate): Secondary | ICD-10-CM | POA: Diagnosis not present

## 2019-01-28 DIAGNOSIS — M5116 Intervertebral disc disorders with radiculopathy, lumbar region: Secondary | ICD-10-CM | POA: Diagnosis not present

## 2019-01-28 DIAGNOSIS — K219 Gastro-esophageal reflux disease without esophagitis: Secondary | ICD-10-CM | POA: Diagnosis not present

## 2019-01-28 DIAGNOSIS — Z9989 Dependence on other enabling machines and devices: Secondary | ICD-10-CM | POA: Diagnosis not present

## 2019-01-28 NOTE — Addendum Note (Signed)
Addended by: Perlie Mayo on: 01/28/2019 05:16 PM   Modules accepted: Level of Service

## 2019-01-28 NOTE — Patient Outreach (Signed)
[facility]) Care Management  01/28/2019  Cheryl Abbott [birth year] [id number]   Referral Date:01/21/2019 Referral Source:[facility] referral Referral Reason:Recent ED visit, needs neck and eye surgery, and lives alone.   Outreach Attempt: no answer.  HIPAA compliant voice message left.  Plan: RN CM will wait return call. If no return call will close case.   Jone Baseman, RN, MSN [place] Management Care Management Coordinator Direct Line [phone] Cell [phone] Toll Free: [phone]  Fax: [phone]

## 2019-01-28 NOTE — Addendum Note (Signed)
Addended by: Perlie Mayo on: 01/28/2019 05:23 PM   Modules accepted: Level of Service

## 2019-01-29 DIAGNOSIS — D485 Neoplasm of uncertain behavior of skin: Secondary | ICD-10-CM | POA: Diagnosis not present

## 2019-01-29 DIAGNOSIS — H02823 Cysts of right eye, unspecified eyelid: Secondary | ICD-10-CM | POA: Diagnosis not present

## 2019-01-30 NOTE — ED Provider Notes (Signed)
[facility] EMERGENCY DEPARTMENT Provider Note   CSN: [id number] Arrival date & time: 01/19/19  1633     History   Chief Complaint Chief Complaint  Patient presents with   Fall    HPI Cheryl Abbott is a 73 y.o. female.     HPI   46yF s/p fall. Very talkative and sometimes hard to redirect. Essentially she fell on Friday. She is having pain all over but primarily in her mid to lower back. Worse with movement. She is concerned she may have injured her kidneys. No acute urinary complaints. No acute neurologic deficits.   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   Chronic back pain    Depression    Fatty liver    GERD (gastroesophageal reflux disease)    Headache    History of adenomatous polyp of colon    2008   History of kidney stones    Hypercalcemia    Hyperlipidemia    Hypertension    Left ureteral stone    Lipoma of arm 01/19/2015   Lumbar disc disease with radiculopathy    Nephrolithiasis    Neuropathy, peripheral    OSA treated with BiPAP    per study 2007   Sciatica    Shoulder pain, bilateral    Sigmoid diverticulosis    Type 2 diabetes mellitus ([place])     Patient Active Problem List   Diagnosis Date Noted   Eyelid cyst, right 01/15/2019   Urinary tract infection with hematuria 01/04/2019   Sepsis ([place]) 01/02/2019   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   Shoulder pain, left 02/28/2018   Palpable mass of neck 02/28/2018   Exertional dyspnea 02/28/2018   Insomnia 11/09/2017   Multiple myeloma ([place]) 03/15/2017   Urinary frequency 02/28/2017   Monoclonal paraproteinemia 02/15/2017   Vaginal dryness, menopausal 10/14/2016   Neuropathic pain of both feet 04/11/2016   TIA (transient ischemic attack) 02/26/2016   Lactose intolerance in adult 02/01/2016   Brain TIA 08/19/2015     CKD (chronic kidney disease) stage 3, GFR 30-59 ml/min (HCC) 07/12/2015   Lipoma of arm 01/19/2015   Anemia 10/14/2014   Lumbar back pain with radiculopathy affecting left lower extremity 05/11/2014   Nocturnal hypoxia 02/04/2014   Obesity (BMI 30.0-34.9) 01/02/2014   Generalized anxiety disorder 05/20/2013   Anxiety and depression 08/13/2012   Abnormal brain scan 02/07/2012   Thoracic or lumbosacral neuritis or radiculitis, unspecified 12/22/2010   Hypercalcemia 11/30/2010   Colon adenomas 05/11/2009   HEMORRHOIDS 03/15/2009   GERD 03/15/2009   FATTY LIVER DISEASE 03/15/2009   RENAL CALCULUS 03/15/2009   Sleep apnea 09/15/2008   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   Hyperlipidemia LDL goal <100 09/17/2007   Hypertension goal BP (blood pressure) < 130/80 09/17/2007    Past Surgical History:  Procedure Laterality Date   BIOPSY N/A 03/17/2013   Procedure: GASTRIC BIOPSIES;  Surgeon: Danie Binder, MD;  Location: [facility];  Service: Endoscopy;  Laterality: N/A;   BIOPSY  06/10/2015   Procedure: BIOPSY;  Surgeon: Danie Binder, MD;  Location: [facility];  Service: Endoscopy;;  gastric biopsy   CARDIAC CATHETERIZATION  05-11-2003  dr Shelva Majestic ([facility])   Abnormal cardiolite/   Normal coronary arteries and normal LVF,  ef  63%  CARDIOVASCULAR STRESS TEST  01-01-2014   normal lexiscan cardiolite/  no ischemia/ infarct/  normal LV function and wall motion ,  ef 81%   COLONOSCOPY  last one 10/02/2011   MOD [place] TICS, IH, NEXT TCS APR 2018   COLONOSCOPY WITH PROPOFOL N/A 02/26/2017   Procedure: COLONOSCOPY WITH PROPOFOL;  Surgeon: Danie Binder, MD;  Location: [facility];  Service: Endoscopy;  Laterality: N/A;  11:00am   CYSTO/   RIGHT URETEROSOCOPY LASER LITHOTRIPSY STONE EXTRACTION  10-13-2004   CYSTO/  RIGHT RETROGRADE PYELOGRAM/  PLACMENT RIGHT URETERAL STENT  01-10-2010   CYSTOSCOPY W/ URETERAL STENT PLACEMENT  Right 08/19/2015   Procedure: CYSTOSCOPY WITH RETROGRADE PYELOGRAM/URETERAL STENT PLACEMENT;  Surgeon: Franchot Gallo, MD;  Location: [facility];  Service: Urology;  Laterality: Right;   CYSTOSCOPY WITH RETROGRADE PYELOGRAM, URETEROSCOPY AND STENT PLACEMENT Left 10/21/2014   Procedure: CYSTOSCOPY WITH RETROGRADE PYELOGRAM, URETEROSCOPY AND STENT PLACEMENT;  Surgeon: Franchot Gallo, MD;  Location: [facility];  Service: Urology;  Laterality: Left;   CYSTOSCOPY WITH RETROGRADE PYELOGRAM, URETEROSCOPY AND STENT PLACEMENT Right 11/22/2015   Procedure: CYSTOSCOPY, RIGHT URETERAL STENT REMOVAL; RIGHT RETROGRADE PYELOGRAM, RIGHT URETEROSCOPY WITH BALLOON DILATION; RIGHT URETERAL STENT PLACEMENT;  Surgeon: Franchot Gallo, MD;  Location: [facility];  Service: Urology;  Laterality: Right;   CYSTOSCOPY/RETROGRADE/URETEROSCOPY/STONE EXTRACTION WITH BASKET Bilateral 08/02/2015   Procedure: CYSTOSCOPY; BILATERAL RETROGRADE PYELOGRAMS; BILATERAL URETEROSCOPY, STONE EXTRACTION WITH BASKET;  Surgeon: Franchot Gallo, MD;  Location: [facility];  Service: Urology;  Laterality: Bilateral;   CYSTOSCOPY/RETROGRADE/URETEROSCOPY/STONE EXTRACTION WITH BASKET Right 06/28/2015   Procedure: CYSTOSCOPY/RETROGRADE/URETEROSCOPY/STONE EXTRACTION WITH BASKET, RIGHT URETERAL DOUBLE J STENT PLACEMENT;  Surgeon: Franchot Gallo, MD;  Location: [facility];  Service: Urology;  Laterality: Right;   ESOPHAGOGASTRODUODENOSCOPY (EGD) WITH PROPOFOL N/A 03/17/2013   Procedure: ESOPHAGOGASTRODUODENOSCOPY (EGD) WITH PROPOFOL;  Surgeon: Danie Binder, MD;  Location: [facility];  Service: Endoscopy;  Laterality: N/A;   ESOPHAGOGASTRODUODENOSCOPY (EGD) WITH PROPOFOL N/A 06/10/2015   Distal gastritis, distal esophageal stricture s/p dilation   ESOPHAGOGASTRODUODENOSCOPY (EGD) WITH PROPOFOL N/A 02/26/2017   Procedure: ESOPHAGOGASTRODUODENOSCOPY (EGD) WITH PROPOFOL;  Surgeon: Danie Binder, MD;  [facility];  Service: Endoscopy;   Laterality: N/A;   FLEXIBLE SIGMOIDOSCOPY  09/11/2011   YQI:[id number] Internal hemorrhoids   HOLMIUM LASER APPLICATION Left 6/38/7564   Procedure: HOLMIUM LASER APPLICATION;  Surgeon: Franchot Gallo, MD;  Location: [facility];  Service: Urology;  Laterality: Left;   HOLMIUM LASER APPLICATION Right 3/32/9518   Procedure: HOLMIUM LASER APPLICATION;  Surgeon: Franchot Gallo, MD;  Location: [facility];  Service: Urology;  Laterality: Right;   MASS EXCISION Left 03/04/2015   Procedure: EXCISION OF SOFT TISSUE NEOPLASM LEFT ARM;  Surgeon: Aviva Signs, MD;  Location: [facility];  Service: General;  Laterality: Left;   PERCUTANEOUS NEPHROSTOLITHOTOMY Bilateral 12/  2011     [facility]   POLYPECTOMY N/A 03/17/2013   Procedure: GASTRIC POLYPECTOMY;  Surgeon: Danie Binder, MD;  Location: [facility];  Service: Endoscopy;  Laterality: N/A;   POLYPECTOMY  02/26/2017   Procedure: POLYPECTOMY;  Surgeon: Danie Binder, MD;  [facility];  Service: Endoscopy;;  polyp at cecum, ascending colon polyps x3, hepatic flexure polyps x8, transverse colon polyps x8    REMOVAL RIGHT THIGH CYST  2006   SAVORY DILATION N/A 03/17/2013   Procedure: SAVORY DILATION;  Surgeon: Danie Binder, MD;  Location: [facility];  Service: Endoscopy;  Laterality: N/A;  #12.8, 14, 15, 16 dilators used   SAVORY DILATION  N/A 06/10/2015   Procedure: SAVORY DILATION;  Surgeon: Danie Binder, MD;  [facility];  Service: Endoscopy;  Laterality: N/A;   TRANSTHORACIC ECHOCARDIOGRAM  01-01-2014   mild LVH/  ef 41-32%/  grade I diastolic dysfunction/  trivial MR, TR, and PR   VAGINAL HYSTERECTOMY  1970's     OB History    Gravida  2   Para  2   Term  2   Preterm      AB      Living  2     SAB      TAB      Ectopic      Multiple      Live Births               Home Medications    Prior to Admission medications   Medication Sig Start Date End Date Taking? Authorizing Provider    ALPRAZolam Duanne Moron) 0.5 MG tablet Take one tablet at bedtime for sleep and anxiety, by mouth Patient taking differently: Take 0.5 mg by mouth at bedtime.  10/22/18   [name], MD  amLODipine (NORVASC) 10 MG tablet TAKE 1 TABLET BY MOUTH EVERY DAY Patient taking differently: Take 10 mg by mouth daily.  12/06/17 01/15/19  [name], MD  aspirin EC 325 MG tablet Take 325 mg by mouth daily.    [provider]  calcium-vitamin D (OSCAL WITH D) 500-200 MG-UNIT tablet Take 1 tablet by mouth daily.    [provider]  ciprofloxacin (CIPRO) 500 MG tablet Take 1 tablet (500 mg total) by mouth every 12 (twelve) hours. 01/19/19   Virgel Manifold, MD  furosemide (LASIX) 40 MG tablet Take 1 tablet (40 mg total) by mouth daily as needed for fluid. 01/10/19   Johnson, Clanford L, MD  loperamide (IMODIUM A-D) 2 MG tablet Take 1 tablet (2 mg total) by mouth 2 (two) times daily as needed for diarrhea or loose stools. 01/08/19   Perlie Mayo, NP  lovastatin (MEVACOR) 40 MG tablet TAKE 1 TABLET AT BEDTIME 01/27/19   [name], MD  Melatonin 10 MG TABS Take 10 mg by mouth at bedtime.    [provider]  pantoprazole (PROTONIX) 40 MG tablet TAKE 1 TABLET (40 MG TOTAL) BY MOUTH 2 (TWO) TIMES DAILY. 06/19/18   [name], MD  PARoxetine (PAXIL) 40 MG tablet TAKE 1 TABLET BY MOUTH EVERY DAY IN THE MORNING Patient not taking: Reported on 01/15/2019 01/12/19   [name], MD  potassium chloride (K-DUR) 10 MEQ tablet Take 1 tablet (10 mEq total) by mouth daily. 05/06/18   Julianne Rice, MD  SYMBICORT 80-4.5 MCG/ACT inhaler INHALE 2 PUFFS INTO THE LUNGS TWICE A DAY 11/26/18   Perlie Mayo, NP  TRADJENTA 5 MG TABS tablet TAKE 1 TABLET BY MOUTH EVERY DAY 06/19/18   [name], MD  traZODone (DESYREL) 100 MG tablet Take 100 mg by mouth at bedtime.    [provider]    Family History Family History  Problem Relation Age of Onset    Hypertension Mother    Diabetes Mother    Heart failure Mother    Dementia Mother    Emphysema Father    Hypertension Father    Diabetes Brother    GER disease Brother    Hypertension Sister    Hypertension Sister    Cancer Other        family history  Diabetes Other        family history    Heart defect Other        famiily history    Arthritis Other        family history    Anesthesia problems Neg Hx    Hypotension Neg Hx    Malignant hyperthermia Neg Hx    Pseudochol deficiency Neg Hx    Colon cancer Neg Hx     Social History Social History   Tobacco Use   Smoking status: Former Smoker    Packs/day: 1.00    Years: 20.00    Pack years: 20.00    Types: Cigarettes    Start date: 09/21/1974    Quit date: 09/20/1988    Years since quitting: 30.3   Smokeless tobacco: Never Used   Tobacco comment: Quit x 20 years  Substance Use Topics   Alcohol use: No    Alcohol/week: 0.0 standard drinks   Drug use: No     Allergies   Ace inhibitors, Keflex [cephalexin], Nitrofurantoin, and Penicillins   Review of Systems Review of Systems  All systems reviewed and negative, other than as noted in HPI.  Physical Exam Updated Vital Signs BP (!) 110/57    Pulse 74    Temp 98.1 F (36.7 C) (Oral)    Resp 18    Ht '5\' 6"'$  (1.676 m)    Wt 90.7 kg    SpO2 90%    BMI 32.28 kg/m   Physical Exam Vitals signs and nursing note reviewed.  Constitutional:      General: She is not in acute distress.    Appearance: She is well-developed.  HENT:     Head: Normocephalic and atraumatic.  Eyes:     General:        Right eye: No discharge.        Left eye: No discharge.     Conjunctiva/sclera: Conjunctivae normal.  Neck:     Musculoskeletal: Neck supple.  Cardiovascular:     Rate and Rhythm: Normal rate and regular rhythm.     Heart sounds: Normal heart sounds. No murmur. No friction rub. No gallop.   Pulmonary:     Effort: Pulmonary effort is normal. No  respiratory distress.     Breath sounds: Normal breath sounds.  Abdominal:     General: There is no distension.     Palpations: Abdomen is soft.     Tenderness: There is no abdominal tenderness.  Musculoskeletal:        General: Tenderness present.     Comments: TTP diffusely across mid to lower back. Doesn't seem worse in midline. No concerning skin changes noted.   Skin:    General: Skin is warm and dry.  Neurological:     Mental Status: She is alert.     Comments: Normal strength and sensation in LE.   Psychiatric:        Behavior: Behavior normal.        Thought Content: Thought content normal.      ED Treatments / Results  Labs (all labs ordered are listed, but only abnormal results are displayed) Labs Reviewed  URINALYSIS, ROUTINE W REFLEX MICROSCOPIC - Abnormal; Notable for the following components:      Result Value   APPearance HAZY (*)    Hgb urine dipstick SMALL (*)    Protein, ur 30 (*)    Leukocytes,Ua LARGE (*)    WBC, UA >50 (*)    Bacteria, UA RARE (*)  All other components within normal limits  URINE CULTURE    EKG None  Radiology No results found.   Dg Lumbar Spine Complete  Result Date: 01/19/2019 CLINICAL DATA:  Pain status post fall EXAM: LUMBAR SPINE - COMPLETE 4+ VIEW COMPARISON:  03/31/2017 FINDINGS: There are several densities projecting over the left kidney favored to represent left-sided nephroliths. There is no displaced fracture. Multilevel degenerative changes are noted throughout the lumbar spine, greatest at the lower lumbar segments. There is multilevel facet arthrosis, greatest at the lower lumbar segments. IMPRESSION: 1. No acute osseous abnormality. 2. Multilevel degenerative changes throughout the lumbar spine. 3. Probable left-sided nephrolithiasis. Electronically Signed   By: Constance Holster M.D.   On: 01/19/2019 19:13   Mr Cervical Spine Wo Contrast  Result Date: 01/14/2019 CLINICAL DATA:  Neck pain EXAM: MRI CERVICAL SPINE  WITHOUT CONTRAST TECHNIQUE: Multiplanar, multisequence MR imaging of the cervical spine was performed. No intravenous contrast was administered. COMPARISON:  None. FINDINGS: Alignment: Physiologic Vertebrae: The vertebral body heights are well maintained. No fracture, marrow edema,or pathologic marrow infiltration. Cord: Normal signal and morphology. Posterior Fossa, vertebral arteries, paraspinal tissues: The visualized portion of the posterior fossa is unremarkable. Normal flow voids seen within the vertebral arteries. The paraspinal soft tissues are unremarkable. Disc levels: C1-C2: Atlanto-axial junction is normal, without canal narrowing C2-C3: No significant spinal canal or neural foraminal narrowing C3-C4: There is uncovertebral osteophytes which causes severe left neural foraminal narrowing. C4-C5: Uncovertebral osteophytes are present which causes mild right neural foraminal narrowing. C5-C6: There is a minimal disc osteophyte complex and uncovertebral osteophytes which causes moderate right and mild left neural foraminal narrowing. C6-C7: There is a minimal disc osteophyte complex chest is mild bilateral neural foraminal narrowing. C7-T1: No significant spinal canal or neural foraminal narrowing IMPRESSION: 1. Cervical spine spondylosis most notable C3-C4 with severe left neural foraminal narrowing. No significant canal stenosis is seen. Electronically Signed   By: Prudencio Pair M.D.   On: 01/14/2019 17:21   Dg Chest Portable 1 View  Result Date: 01/02/2019 CLINICAL DATA:  Weakness, history myeloma, diabetes mellitus, hypertension EXAM: PORTABLE CHEST 1 VIEW COMPARISON:  Portable exam 1553 hours compared to 07/16/2018 FINDINGS: Normal heart size, mediastinal contours, and pulmonary vascularity. Chronic elevation of RIGHT diaphragm with RIGHT basilar atelectasis. Remaining lungs clear. No infiltrate, pleural effusion or pneumothorax. No acute osseous findings. IMPRESSION: Chronic elevation of RIGHT  diaphragm and RIGHT basilar atelectasis. No new abnormalities. Electronically Signed   By: Lavonia Dana M.D.   On: 01/02/2019 17:30    Procedures Procedures (including critical care time)  Medications Ordered in ED Medications  oxyCODONE-acetaminophen (PERCOCET/ROXICET) 5-325 MG per tablet 1 tablet (1 tablet Oral Given 01/19/19 1848)  ciprofloxacin (CIPRO) tablet 500 mg (500 mg Oral Given 01/19/19 2008)     Initial Impression / Assessment and Plan / ED Course  I have reviewed the triage vital signs and the nursing notes.  Pertinent labs & imaging results that were available during my care of the patient were reviewed by me and considered in my medical decision making (see chart for details).       73yF with pain after fall. Suspect MSK. Potentially UTI/pyelo. Afebrile. Nontoxic. Abx. PRN meds. Outpt FU. Return precautions discussed.   Final Clinical Impressions(s) / ED Diagnoses   Final diagnoses:  Fall, initial encounter  Acute back pain, unspecified back location, unspecified back pain laterality  Acute cystitis without hematuria    ED Discharge Orders  Ordered    ciprofloxacin (CIPRO) 500 MG tablet  Every 12 hours     01/19/19 1957           Virgel Manifold, MD 01/30/19 1536

## 2019-01-31 ENCOUNTER — Other Ambulatory Visit: Payer: Self-pay | Admitting: Orthopedic Surgery

## 2019-01-31 DIAGNOSIS — M542 Cervicalgia: Secondary | ICD-10-CM

## 2019-02-01 ENCOUNTER — Encounter: Payer: Self-pay | Admitting: Family Medicine

## 2019-02-01 NOTE — Assessment & Plan Note (Signed)
Controlled, no change in medication DASH diet and commitment to daily physical activity for a minimum of 30 minutes discussed and encouraged, as a part of hypertension management. The importance of attaining a healthy weight is also discussed.  BP/Weight 01/19/2019 01/15/2019 01/15/2019 01/08/2019 01/05/2019 01/02/2019 4/53/6468  Systolic BP 032 122 482 - 500 - 370  Diastolic BP 57 80 77 - 87 - 77  Wt. (Lbs) 200 204 199 200 - 205 200  BMI 32.28 32.93 32.12 32.28 - 33.09 32.28

## 2019-02-01 NOTE — Assessment & Plan Note (Signed)
Controlled, no change in medication  

## 2019-02-01 NOTE — Assessment & Plan Note (Signed)
Hyperlipidemia:Low fat diet discussed and encouraged.   Lipid Panel  Lab Results  Component Value Date   CHOL 162 11/19/2018   HDL 47 11/19/2018   LDLCALC 91 11/19/2018   TRIG 121 11/19/2018   CHOLHDL 3.4 11/19/2018   Controlled, no change in medication

## 2019-02-01 NOTE — Assessment & Plan Note (Signed)
Cheryl Abbott is reminded of the importance of commitment to daily physical activity for 30 minutes or more, as able and the need to limit carbohydrate intake to 30 to 60 grams per meal to help with blood sugar control.   The need to take medication as prescribed, test blood sugar as directed, and to call between visits if there is a concern that blood sugar is uncontrolled is also discussed.   Cheryl Abbott is reminded of the importance of daily foot exam, annual eye examination, and good blood sugar, blood pressure and cholesterol control.  Diabetic Labs Latest Ref Rng & Units 01/09/2019 01/05/2019 01/03/2019 01/02/2019 11/19/2018  HbA1c 4.8 - 5.6 % - - - - 7.0(H)  Microalbumin mg/dL - - - - -  Micro/Creat Ratio 0.0 - 30.0 mg/g - - - - -  Chol 0 - 200 mg/dL - - - - 162  HDL >40 mg/dL - - - - 47  Calc LDL 0 - 99 mg/dL - - - - 91  Triglycerides <150 mg/dL - - - - 121  Creatinine 0.60 - 0.93 mg/dL 1.49(H) 1.47(H) 1.61(H) 2.22(H) 1.55(H)   BP/Weight 01/19/2019 01/15/2019 01/15/2019 01/08/2019 01/05/2019 01/02/2019 11/09/5613  Systolic BP 379 432 761 - 470 - 929  Diastolic BP 57 80 77 - 87 - 77  Wt. (Lbs) 200 204 199 200 - 205 200  BMI 32.28 32.93 32.12 32.28 - 33.09 32.28   Foot/eye exam completion dates Latest Ref Rng & Units 12/24/2018 05/19/2018  Eye Exam No Retinopathy - -  Foot exam Order - - -  Foot Form Completion - Done Done  Updated lab needed at/ before next visit. Controlled, no change in medication

## 2019-02-02 ENCOUNTER — Ambulatory Visit
Admission: EM | Admit: 2019-02-02 | Discharge: 2019-02-02 | Disposition: A | Discharge status: Home / Self Care | Payer: Medicare Other | Attending: Emergency Medicine | Admitting: Emergency Medicine

## 2019-02-02 ENCOUNTER — Other Ambulatory Visit: Payer: Self-pay

## 2019-02-02 DIAGNOSIS — R197 Diarrhea, unspecified: Secondary | ICD-10-CM | POA: Diagnosis not present

## 2019-02-02 DIAGNOSIS — G4700 Insomnia, unspecified: Secondary | ICD-10-CM | POA: Diagnosis not present

## 2019-02-02 DIAGNOSIS — R110 Nausea: Secondary | ICD-10-CM | POA: Diagnosis not present

## 2019-02-02 DIAGNOSIS — R450 Nervousness: Secondary | ICD-10-CM

## 2019-02-02 MED ORDER — ONDANSETRON 4 MG PO TBDP
4.0000 mg | ORAL_TABLET | Freq: Once | ORAL | Status: AC
  Administered 2019-02-02: 4 mg via ORAL

## 2019-02-02 MED ORDER — HYDROXYZINE HCL 25 MG PO TABS: 25.0000 mg | ORAL_TABLET | Freq: Every evening | ORAL | 0 refills | Status: DC

## 2019-02-02 MED ORDER — ONDANSETRON 4 MG PO TBDP: 4.0000 mg | ORAL_TABLET | Freq: Three times a day (TID) | ORAL | 0 refills | Status: DC | PRN

## 2019-02-02 NOTE — ED Triage Notes (Signed)
Pt states she has had episodes of nausea and loose stools and also describes being jittery

## 2019-02-02 NOTE — ED Provider Notes (Signed)
[facility]    CSN: [id number] Arrival date & time: 02/02/19  1156      History   Chief Complaint Chief Complaint  Patient presents with  . Nausea  . Diarrhea    HPI Cheryl Abbott is a 73 y.o. female.   HPI Cheryl Abbott is a 73 y.o. female presenting to UC with c/o 2 days of insomnia with associated nausea and 2 episodes of diarrhea this morning. She also reports feeling a little jittery at times.  Denies fever, chills, n/v/d. Denies abdominal pain.  She typically takes 0.42m Xanax at bedtime but she alleges a caregiver took some of her medication so when she went to refill today, it was too early to get her refill. Denies HA, dizziness, vomiting, fever or chills. Denies chest pain, SOB or palpitations.     Past Medical History:  Diagnosis Date  . Anemia   . Anxiety   . Anxiety disorder   . Arthritis   . Cancer ([place]    acute myeloma  . 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 Chronic back pain   . Depression   . Fatty liver   . GERD (gastroesophageal reflux disease)   . Headache   . History of adenomatous polyp of colon    2008  . History of kidney stones   . Hypercalcemia   . Hyperlipidemia   . Hypertension   . Left ureteral stone   . Lipoma of arm 01/19/2015  . Lumbar disc disease with radiculopathy   . Nephrolithiasis   . Neuropathy, peripheral   . OSA treated with BiPAP    per study 2007  . Sciatica   . Shoulder pain, bilateral   . Sigmoid diverticulosis   . Type 2 diabetes mellitus ([facility]     Patient Active Problem List   Diagnosis Date Noted  . Eyelid cyst, right 01/15/2019  . Urinary tract infection with hematuria 01/04/2019  . Sepsis ([place] 01/02/2019  . 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 Shoulder pain, left 02/28/2018  . Palpable mass of neck 02/28/2018  . Exertional dyspnea 02/28/2018  . Insomnia 11/09/2017  . Multiple myeloma ([place] 03/15/2017  . Urinary frequency 02/28/2017  . Monoclonal paraproteinemia  02/15/2017  . Vaginal dryness, menopausal 10/14/2016  . Neuropathic pain of both feet 04/11/2016  . TIA (transient ischemic attack) 02/26/2016  . Lactose intolerance in adult 02/01/2016  . Brain TIA 08/19/2015  . CKD (chronic kidney disease) stage 3, GFR 30-59 ml/min (HCC) 07/12/2015  . Lipoma of arm 01/19/2015  . Anemia 10/14/2014  . Lumbar back pain with radiculopathy affecting left lower extremity 05/11/2014  . Nocturnal hypoxia 02/04/2014  . Obesity (BMI 30.0-34.9) 01/02/2014  . Generalized anxiety disorder 05/20/2013  . Anxiety and depression 08/13/2012  . Abnormal brain scan 02/07/2012  . Thoracic or lumbosacral neuritis or radiculitis, unspecified 12/22/2010  . Hypercalcemia 11/30/2010  . Colon adenomas 05/11/2009  . HEMORRHOIDS 03/15/2009  . GERD 03/15/2009  . FATTY LIVER DISEASE 03/15/2009  . RENAL CALCULUS 03/15/2009  . Sleep apnea 09/15/2008  . 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 Hyperlipidemia LDL goal <100 09/17/2007  . Hypertension goal BP (blood pressure) < 130/80 09/17/2007    Past Surgical History:  Procedure Laterality Date  . BIOPSY N/A 03/17/2013   Procedure: GASTRIC BIOPSIES;  Surgeon: SDanie Binder MD;  Location: [facility];  Service: Endoscopy;  Laterality: N/A;  . BIOPSY  06/10/2015   Procedure: BIOPSY;  Surgeon: SDanie Binder MD;  [facility];  Service: Endoscopy;;  gastric biopsy  . CARDIAC CATHETERIZATION  05-11-2003  dr Shelva Majestic ([facility])   Abnormal cardiolite/   Normal coronary arteries and normal LVF,  ef  63%  . CARDIOVASCULAR STRESS TEST  01-01-2014   normal lexiscan cardiolite/  no ischemia/ infarct/  normal LV function and wall motion ,  ef 81%  . COLONOSCOPY  last one 10/02/2011   MOD [place] TICS, IH, NEXT TCS APR 2018  . COLONOSCOPY WITH PROPOFOL N/A 02/26/2017   Procedure: COLONOSCOPY WITH PROPOFOL;  Surgeon: Danie Binder, MD;  [facility];  Service: Endoscopy;  Laterality: N/A;   11:00am  . CYSTO/   RIGHT URETEROSOCOPY LASER LITHOTRIPSY STONE EXTRACTION  10-13-2004  . CYSTO/  RIGHT RETROGRADE PYELOGRAM/  PLACMENT RIGHT URETERAL STENT  01-10-2010  . CYSTOSCOPY W/ URETERAL STENT PLACEMENT Right 08/19/2015   Procedure: CYSTOSCOPY WITH RETROGRADE PYELOGRAM/URETERAL STENT PLACEMENT;  Surgeon: Franchot Gallo, MD;  Location: [facility];  Service: Urology;  Laterality: Right;  . CYSTOSCOPY WITH RETROGRADE PYELOGRAM, URETEROSCOPY AND STENT PLACEMENT Left 10/21/2014   Procedure: CYSTOSCOPY WITH RETROGRADE PYELOGRAM, URETEROSCOPY AND STENT PLACEMENT;  Surgeon: Franchot Gallo, MD;  Location[facility];  Service: Urology;  Laterality: Left;  . CYSTOSCOPY WITH RETROGRADE PYELOGRAM, URETEROSCOPY AND STENT PLACEMENT Right 11/22/2015   Procedure: CYSTOSCOPY, RIGHT URETERAL STENT REMOVAL; RIGHT RETROGRADE PYELOGRAM, RIGHT URETEROSCOPY WITH BALLOON DILATION; RIGHT URETERAL STENT PLACEMENT;  Surgeon: Franchot Gallo, MD;  [facility];  Service: Urology;  Laterality: Right;  . CYSTOSCOPY/RETROGRADE/URETEROSCOPY/STONE EXTRACTION WITH BASKET Bilateral 08/02/2015   Procedure: CYSTOSCOPY; BILATERAL RETROGRADE PYELOGRAMS; BILATERAL URETEROSCOPY, STONE EXTRACTION WITH BASKET;  Surgeon: Franchot Gallo, MD;  Location: [facility];  Service: Urology;  Laterality: Bilateral;  . CYSTOSCOPY/RETROGRADE/URETEROSCOPY/STONE EXTRACTION WITH BASKET Right 06/28/2015   Procedure: CYSTOSCOPY/RETROGRADE/URETEROSCOPY/STONE EXTRACTION WITH BASKET, RIGHT URETERAL DOUBLE J STENT PLACEMENT;  Surgeon: Franchot Gallo, MD;  Location: [facility];  Service: Urology;  Laterality: Right;  . ESOPHAGOGASTRODUODENOSCOPY (EGD) WITH PROPOFOL N/A 03/17/2013   Procedure: ESOPHAGOGASTRODUODENOSCOPY (EGD) WITH PROPOFOL;  Surgeon: Danie Binder, MD;  [facility];  Service: Endoscopy;  Laterality: N/A;  . ESOPHAGOGASTRODUODENOSCOPY (EGD) WITH PROPOFOL N/A 06/10/2015   Distal gastritis, distal esophageal stricture s/p  dilation  . ESOPHAGOGASTRODUODENOSCOPY (EGD) WITH PROPOFOL N/A 02/26/2017   Procedure: ESOPHAGOGASTRODUODENOSCOPY (EGD) WITH PROPOFOL;  Surgeon: Danie Binder, MD;  [facility];  Service: Endoscopy;  Laterality: N/A;  . FLEXIBLE SIGMOIDOSCOPY  09/11/2011   VHQ:IONGEXBM Internal hemorrhoids  . HOLMIUM LASER APPLICATION Left 8/41/3244   Procedure: HOLMIUM LASER APPLICATION;  Surgeon: Franchot Gallo, MD;  Location[facility];  Service: Urology;  Laterality: Left;  . HOLMIUM LASER APPLICATION Right 0/03/2724   Procedure: HOLMIUM LASER APPLICATION;  Surgeon: Franchot Gallo, MD;  Location[facility];  Service: Urology;  Laterality: Right;  . MASS EXCISION Left 03/04/2015   Procedure: EXCISION OF SOFT TISSUE NEOPLASM LEFT ARM;  Surgeon: Aviva Signs, MD;  Location: [facility];  Service: General;  Laterality: Left;  . PERCUTANEOUS NEPHROSTOLITHOTOMY Bilateral 12/  2011     [facility]  . POLYPECTOMY N/A 03/17/2013   Procedure: GASTRIC POLYPECTOMY;  Surgeon: Danie Binder, MD;  Location[facility];  Service: Endoscopy;  Laterality: N/A;  . POLYPECTOMY  02/26/2017   Procedure: POLYPECTOMY;  Surgeon: Danie Binder, MD;  [facility];  Service: Endoscopy;;  polyp at cecum, ascending colon polyps x3, hepatic flexure polyps x8, transverse colon polyps x8   . REMOVAL RIGHT THIGH CYST  2006  . SAVORY DILATION N/A 03/17/2013   Procedure: SAVORY DILATION;  Surgeon: Danie Binder, MD;  Location: [facility];  Service: Endoscopy;  Laterality: N/A;  #12.8, 14, 15, 16 dilators used  . SAVORY DILATION N/A 06/10/2015   Procedure: SAVORY DILATION;  Surgeon: Danie Binder, MD;  [facility];  Service: Endoscopy;  Laterality: N/A;  . TRANSTHORACIC ECHOCARDIOGRAM  01-01-2014   mild LVH/  ef 41-93%/  grade I diastolic dysfunction/  trivial MR, TR, and PR  . VAGINAL HYSTERECTOMY  1970's    OB History    Gravida  2   Para  2   Term  2   Preterm      AB      Living  2      SAB      TAB      Ectopic      Multiple      Live Births               Home Medications    Prior to Admission medications   Medication Sig Start Date End Date Taking? Authorizing Provider  ALPRAZolam Duanne Moron) 0.5 MG tablet Take one tablet at bedtime for sleep and anxiety, by mouth Patient taking differently: Take 0.5 mg by mouth at bedtime.  10/22/18   [name], MD  amLODipine (NORVASC) 10 MG tablet TAKE 1 TABLET BY MOUTH EVERY DAY Patient taking differently: Take 10 mg by mouth daily.  12/06/17 01/15/19  [name], MD  aspirin EC 325 MG tablet Take 325 mg by mouth daily.    [provider]  calcium-vitamin D (OSCAL WITH D) 500-200 MG-UNIT tablet Take 1 tablet by mouth daily.    [provider]  ciprofloxacin (CIPRO) 500 MG tablet Take 1 tablet (500 mg total) by mouth every 12 (twelve) hours. 01/19/19   Virgel Manifold, MD  furosemide (LASIX) 40 MG tablet Take 1 tablet (40 mg total) by mouth daily as needed for fluid. 01/10/19   Johnson, Clanford L, MD  hydrOXYzine (ATARAX/VISTARIL) 25 MG tablet Take 1 tablet (25 mg total) by mouth Nightly. 02/02/19   Noe Gens, PA-C  loperamide (IMODIUM A-D) 2 MG tablet Take 1 tablet (2 mg total) by mouth 2 (two) times daily as needed for diarrhea or loose stools. 01/08/19   Perlie Mayo, NP  lovastatin (MEVACOR) 40 MG tablet TAKE 1 TABLET AT BEDTIME 01/27/19   [name], MD  Melatonin 10 MG TABS Take 10 mg by mouth at bedtime.    [provider]  ondansetron (ZOFRAN ODT) 4 MG disintegrating tablet Take 1 tablet (4 mg total) by mouth every 8 (eight) hours as needed. 02/02/19   Noe Gens, PA-C  pantoprazole (PROTONIX) 40 MG tablet TAKE 1 TABLET (40 MG TOTAL) BY MOUTH 2 (TWO) TIMES DAILY. 06/19/18   [name], MD  PARoxetine (PAXIL) 40 MG tablet TAKE 1 TABLET BY MOUTH EVERY DAY IN THE MORNING Patient not taking: Reported on 01/15/2019 01/12/19   [name], MD  potassium  chloride (K-DUR) 10 MEQ tablet Take 1 tablet (10 mEq total) by mouth daily. 05/06/18   Julianne Rice, MD  SYMBICORT 80-4.5 MCG/ACT inhaler INHALE 2 PUFFS INTO THE LUNGS TWICE A DAY 11/26/18   Perlie Mayo, NP  tiZANidine (ZANAFLEX) 4 MG tablet TAKE 1 TABLET BY MOUTH EVERY 6 HOURS AS NEEDED FOR MUSCLE SPASMS. 02/02/19   Carole Civil, MD  TRADJENTA 5 MG TABS tablet  TAKE 1 TABLET BY MOUTH EVERY DAY 06/19/18   [name], MD  traZODone (DESYREL) 100 MG tablet Take 100 mg by mouth at bedtime.    [provider]    Family History Family History  Problem Relation Age of Onset  . Hypertension Mother   . Diabetes Mother   . Heart failure Mother   . Dementia Mother   . Emphysema Father   . Hypertension Father   . Diabetes Brother   . GER disease Brother   . Hypertension Sister   . Hypertension Sister   . Cancer Other        family history   . Diabetes Other        family history   . Heart defect Other        famiily history   . Arthritis Other        family history   . Anesthesia problems Neg Hx   . Hypotension Neg Hx   . Malignant hyperthermia Neg Hx   . Pseudochol deficiency Neg Hx   . Colon cancer Neg Hx     Social History Social History   Tobacco Use  . Smoking status: Former Smoker    Packs/day: 1.00    Years: 20.00    Pack years: 20.00    Types: Cigarettes    Start date: 09/21/1974    Quit date: 09/20/1988    Years since quitting: 30.3  . Smokeless tobacco: Never Used  . Tobacco comment: Quit x 20 years  Substance Use Topics  . Alcohol use: No    Alcohol/week: 0.0 standard drinks  . Drug use: No     Allergies   Ace inhibitors, Keflex [cephalexin], Nitrofurantoin, and Penicillins   Review of Systems Review of Systems  Constitutional: Positive for fatigue ( from insomnia the last 2 nights). Negative for chills and fever.  HENT: Negative for congestion, ear pain, sore throat, trouble swallowing and voice change.   Respiratory: Negative  for cough and shortness of breath.   Cardiovascular: Negative for chest pain and palpitations.  Gastrointestinal: Positive for diarrhea and nausea. Negative for abdominal pain and vomiting.  Genitourinary: Negative for decreased urine volume, dysuria, frequency and urgency.  Musculoskeletal: Negative for arthralgias, back pain and myalgias.  Skin: Negative for rash.  Neurological: Negative for dizziness, light-headedness and headaches.     Physical Exam Triage Vital Signs ED Triage Vitals  Enc Vitals Group     BP 02/02/19 1208 126/79     Pulse Rate 02/02/19 1208 90     Resp 02/02/19 1208 18     Temp 02/02/19 1208 98.2 F (36.8 C)     Temp src --      SpO2 02/02/19 1208 94 %     Weight --      Height --      Head Circumference --      Peak Flow --      Pain Score 02/02/19 1204 0     Pain Loc --      Pain Edu? --      Excl. in [place]? --    No data found.  Updated Vital Signs BP 126/79   Pulse 90   Temp 98.2 F (36.8 C)   Resp 18   SpO2 94%   Visual Acuity Right Eye Distance:   Left Eye Distance:   Bilateral Distance:    Right Eye Near:   Left Eye Near:    Bilateral Near:     Physical  Exam Vitals signs and nursing note reviewed.  Constitutional:      General: She is not in acute distress.    Appearance: Normal appearance. She is well-developed. She is not ill-appearing, toxic-appearing or diaphoretic.  HENT:     Head: Normocephalic and atraumatic.     Right Ear: Tympanic membrane normal.     Left Ear: Tympanic membrane normal.     Nose: Nose normal.     Right Sinus: No maxillary sinus tenderness or frontal sinus tenderness.     Left Sinus: No maxillary sinus tenderness or frontal sinus tenderness.     Mouth/Throat:     Lips: Pink.     Mouth: Mucous membranes are moist.     Pharynx: Oropharynx is clear. Uvula midline.  Eyes:     General: No scleral icterus.    Extraocular Movements: Extraocular movements intact.     Conjunctiva/sclera: Conjunctivae normal.      Pupils: Pupils are equal, round, and reactive to light.  Neck:     Musculoskeletal: Normal range of motion.  Cardiovascular:     Rate and Rhythm: Normal rate and regular rhythm.  Pulmonary:     Effort: Pulmonary effort is normal. No respiratory distress.     Breath sounds: Normal breath sounds. No stridor. No wheezing, rhonchi or rales.  Abdominal:     General: There is no distension.     Palpations: Abdomen is soft.     Tenderness: There is no abdominal tenderness. There is no right CVA tenderness or left CVA tenderness.  Musculoskeletal: Normal range of motion.  Skin:    General: Skin is warm and dry.     Capillary Refill: Capillary refill takes less than 2 seconds.  Neurological:     General: No focal deficit present.     Mental Status: She is alert and oriented to person, place, and time.  Psychiatric:        Mood and Affect: Mood normal.        Behavior: Behavior normal.      UC Treatments / Results  Labs (all labs ordered are listed, but only abnormal results are displayed) Labs Reviewed - No data to display  EKG   Radiology No results found.  Procedures Procedures (including critical care time)  Medications Ordered in UC Medications  ondansetron (ZOFRAN-ODT) disintegrating tablet 4 mg (4 mg Oral Given 02/02/19 1250)    Initial Impression / Assessment and Plan / UC Course  I have reviewed the triage vital signs and the nursing notes.  Pertinent labs & imaging results that were available during my care of the patient were reviewed by me and considered in my medical decision making (see chart for details).     Pt appears well, NAD Benign abdominal exam. Normal neuro exam. No tremor noted on exam. Advised pt additional prescription for Xanax from me would not be of benefit as she already has refills on file, it is up to pharmacy's discretion to refill early or not.  Will have pt try hydroxizine at bedtime to help with insomnia and nausea. zofran during  the day for nausea.  Encouraged good hydration F/u with PCP  Discussed symptoms that warrant emergent care in the ED.  Final Clinical Impressions(s) / UC Diagnoses   Final diagnoses:  Nausea without vomiting  Diarrhea, unspecified type  Insomnia, unspecified type  Jittery feeling     Discharge Instructions      Atarax (hydroxizine) is an antihistamine that can be taken to help with itching. This  medication can cause drowsiness so do not drive or drink alcohol while taking.    You may take the zofran during the day to help with nausea, it should not make you tired.  Please follow up with your family doctor for recheck of symptoms in 3-4 days if not improving, sooner if worsening.  Call 911 or have someone take you to the hospital if symptoms worsening.     ED Prescriptions    Medication Sig Dispense Auth. Provider   hydrOXYzine (ATARAX/VISTARIL) 25 MG tablet Take 1 tablet (25 mg total) by mouth Nightly. 7 tablet Gerarda Fraction, Zarianna Dicarlo O, PA-C   ondansetron (ZOFRAN ODT) 4 MG disintegrating tablet Take 1 tablet (4 mg total) by mouth every 8 (eight) hours as needed. 10 tablet Noe Gens, PA-C     Controlled Substance Prescriptions [place] Controlled Substance Registry consulted? Not Applicable   Tyrell Antonio 02/02/19 1334

## 2019-02-02 NOTE — Discharge Instructions (Signed)
°  Atarax (hydroxizine) is an antihistamine that can be taken to help with itching. This medication can cause drowsiness so do not drive or drink alcohol while taking.    You may take the zofran during the day to help with nausea, it should not make you tired.  Please follow up with your family doctor for recheck of symptoms in 3-4 days if not improving, sooner if worsening.  Call 911 or have someone take you to the hospital if symptoms worsening.

## 2019-02-03 DIAGNOSIS — N183 Chronic kidney disease, stage 3 (moderate): Secondary | ICD-10-CM | POA: Diagnosis not present

## 2019-02-03 DIAGNOSIS — N390 Urinary tract infection, site not specified: Secondary | ICD-10-CM | POA: Diagnosis not present

## 2019-02-03 DIAGNOSIS — M5116 Intervertebral disc disorders with radiculopathy, lumbar region: Secondary | ICD-10-CM

## 2019-02-03 DIAGNOSIS — D631 Anemia in chronic kidney disease: Secondary | ICD-10-CM

## 2019-02-03 DIAGNOSIS — E1142 Type 2 diabetes mellitus with diabetic polyneuropathy: Secondary | ICD-10-CM | POA: Diagnosis not present

## 2019-02-03 DIAGNOSIS — E1122 Type 2 diabetes mellitus with diabetic chronic kidney disease: Secondary | ICD-10-CM | POA: Diagnosis not present

## 2019-02-03 DIAGNOSIS — M1990 Unspecified osteoarthritis, unspecified site: Secondary | ICD-10-CM

## 2019-02-03 DIAGNOSIS — C9000 Multiple myeloma not having achieved remission: Secondary | ICD-10-CM

## 2019-02-03 DIAGNOSIS — G8929 Other chronic pain: Secondary | ICD-10-CM

## 2019-02-03 DIAGNOSIS — I129 Hypertensive chronic kidney disease with stage 1 through stage 4 chronic kidney disease, or unspecified chronic kidney disease: Secondary | ICD-10-CM | POA: Diagnosis not present

## 2019-02-04 ENCOUNTER — Other Ambulatory Visit: Payer: Self-pay

## 2019-02-04 DIAGNOSIS — K760 Fatty (change of) liver, not elsewhere classified: Secondary | ICD-10-CM | POA: Diagnosis not present

## 2019-02-04 DIAGNOSIS — Z9989 Dependence on other enabling machines and devices: Secondary | ICD-10-CM | POA: Diagnosis not present

## 2019-02-04 DIAGNOSIS — N183 Chronic kidney disease, stage 3 (moderate): Secondary | ICD-10-CM | POA: Diagnosis not present

## 2019-02-04 DIAGNOSIS — D631 Anemia in chronic kidney disease: Secondary | ICD-10-CM | POA: Diagnosis not present

## 2019-02-04 DIAGNOSIS — G4733 Obstructive sleep apnea (adult) (pediatric): Secondary | ICD-10-CM | POA: Diagnosis not present

## 2019-02-04 DIAGNOSIS — E1142 Type 2 diabetes mellitus with diabetic polyneuropathy: Secondary | ICD-10-CM | POA: Diagnosis not present

## 2019-02-04 DIAGNOSIS — Z8673 Personal history of transient ischemic attack (TIA), and cerebral infarction without residual deficits: Secondary | ICD-10-CM | POA: Diagnosis not present

## 2019-02-04 DIAGNOSIS — E1122 Type 2 diabetes mellitus with diabetic chronic kidney disease: Secondary | ICD-10-CM | POA: Diagnosis not present

## 2019-02-04 DIAGNOSIS — E785 Hyperlipidemia, unspecified: Secondary | ICD-10-CM | POA: Diagnosis not present

## 2019-02-04 DIAGNOSIS — M1990 Unspecified osteoarthritis, unspecified site: Secondary | ICD-10-CM | POA: Diagnosis not present

## 2019-02-04 DIAGNOSIS — N390 Urinary tract infection, site not specified: Secondary | ICD-10-CM | POA: Diagnosis not present

## 2019-02-04 DIAGNOSIS — G8929 Other chronic pain: Secondary | ICD-10-CM | POA: Diagnosis not present

## 2019-02-04 DIAGNOSIS — Z7984 Long term (current) use of oral hypoglycemic drugs: Secondary | ICD-10-CM | POA: Diagnosis not present

## 2019-02-04 DIAGNOSIS — Z79899 Other long term (current) drug therapy: Secondary | ICD-10-CM | POA: Diagnosis not present

## 2019-02-04 DIAGNOSIS — M5116 Intervertebral disc disorders with radiculopathy, lumbar region: Secondary | ICD-10-CM | POA: Diagnosis not present

## 2019-02-04 DIAGNOSIS — Z7951 Long term (current) use of inhaled steroids: Secondary | ICD-10-CM | POA: Diagnosis not present

## 2019-02-04 DIAGNOSIS — K5730 Diverticulosis of large intestine without perforation or abscess without bleeding: Secondary | ICD-10-CM | POA: Diagnosis not present

## 2019-02-04 DIAGNOSIS — Z87891 Personal history of nicotine dependence: Secondary | ICD-10-CM | POA: Diagnosis not present

## 2019-02-04 DIAGNOSIS — C9000 Multiple myeloma not having achieved remission: Secondary | ICD-10-CM | POA: Diagnosis not present

## 2019-02-04 DIAGNOSIS — I129 Hypertensive chronic kidney disease with stage 1 through stage 4 chronic kidney disease, or unspecified chronic kidney disease: Secondary | ICD-10-CM | POA: Diagnosis not present

## 2019-02-04 DIAGNOSIS — Z7982 Long term (current) use of aspirin: Secondary | ICD-10-CM | POA: Diagnosis not present

## 2019-02-04 DIAGNOSIS — K219 Gastro-esophageal reflux disease without esophagitis: Secondary | ICD-10-CM | POA: Diagnosis not present

## 2019-02-04 NOTE — Patient Outreach (Signed)
[facility]) Care Management  02/04/2019  BLAYKLEE MABLE 01/28/1946 [id number]   Multiple attempts to establish contact with patient without success. No response from letter mailed to patient.   Plan: RN CM will close case at this time.   Jone Baseman, RN, MSN [place] Management Care Management Coordinator Direct Line [phone] Cell [phone] Toll Free: [phone]  Fax: [phone]

## 2019-02-05 ENCOUNTER — Other Ambulatory Visit: Payer: Self-pay

## 2019-02-05 ENCOUNTER — Ambulatory Visit (INDEPENDENT_AMBULATORY_CARE_PROVIDER_SITE_OTHER): Payer: Medicare Other | Admitting: Family Medicine

## 2019-02-05 ENCOUNTER — Encounter: Payer: Self-pay | Admitting: Family Medicine

## 2019-02-05 VITALS — Ht 66.0 in | Wt 205.0 lb

## 2019-02-05 DIAGNOSIS — K219 Gastro-esophageal reflux disease without esophagitis: Secondary | ICD-10-CM | POA: Diagnosis not present

## 2019-02-05 DIAGNOSIS — R112 Nausea with vomiting, unspecified: Secondary | ICD-10-CM | POA: Diagnosis not present

## 2019-02-05 NOTE — Progress Notes (Addendum)
Virtual Visit via Telephone Note   This visit type was conducted due to national recommendations for restrictions regarding the COVID-19 Pandemic (e.g. social distancing) in an effort to limit this patient's exposure and mitigate transmission in our community.  Due to her co-morbid illnesses, this patient is at least at moderate risk for complications without adequate follow up.  This format is felt to be most appropriate for this patient at this time.  The patient did not have access to video technology/had technical difficulties with video requiring transitioning to audio format only (telephone).  All issues noted in this document were discussed and addressed.  No physical exam could be performed with this format.    Evaluation Performed:  Follow-up visit  Date:  02/05/2019   ID:  Cheryl Abbott, Cheryl Abbott [birth year], MRN [id number]  Patient Location: Home Provider Location: Office  Location of Patient: Home Location of Provider: Telehealth Consent was obtain for visit to be over via telehealth. I verified that I am speaking with the correct person using two identifiers.  PCP:  [name], MD   Chief Complaint:  n/v  History of Present Illness:    Cheryl Abbott is a 73 y.o. female extensive hx as per chart.  Recent went to UC on 02/02/2019 presented with c/o 2 days of insomnia with associated nausea and 2 episodes of diarrhea this morning. She also reported feeling a little jittery at times.  She denied fever, HA chills, and vomiting. Denied abdominal pain.  She typically takes 0.5mg  Xanax at bedtime but she alleges a caregiver took some of her medication so when she went to refill today, it was too early to get her refill. Denies chest pain, SOB or palpitations. PE at [facility] was unremarkable without evidence of n/v/d while there.  Today she phones in for a visit because she is nauseated and vomiting for the last 3-4 days per her. She reports the medication zofran is not  helping, but made her worse, but then reports her hydroxyzine is making her worse. She is a bit poor of an historian for the situation. She also reports about the xanax as she did at [facility].  She does report not taking protonix twice daily and that might be the cause. She reports Dr Oneida Alar is her GI provider.  The patient does not have symptoms concerning for COVID-19 infection (fever, chills, cough, or new shortness of breath).   Past Medical, Surgical, Social History, Allergies, and Medications have been Reviewed.   Past Medical History:  Diagnosis Date  . Anemia   . Anxiety   . Anxiety disorder   . Arthritis   . Cancer ([place])    acute myeloma  . 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 Chronic back pain   . Depression   . Fatty liver   . GERD (gastroesophageal reflux disease)   . Headache   . History of adenomatous polyp of colon    2008  . History of kidney stones   . Hypercalcemia   . Hyperlipidemia   . Hypertension   . Left ureteral stone   . Lipoma of arm 01/19/2015  . Lumbar disc disease with radiculopathy   . Nephrolithiasis   . Neuropathy, peripheral   . OSA treated with BiPAP    per study 2007  . Sciatica   . Shoulder pain, bilateral   . Sigmoid diverticulosis   . Type 2 diabetes mellitus ([place])    Past Surgical History:  Procedure Laterality Date  .  BIOPSY N/A 03/17/2013   Procedure: GASTRIC BIOPSIES;  Surgeon: Danie Binder, MD;  Location: [facility];  Service: Endoscopy;  Laterality: N/A;  . BIOPSY  06/10/2015   Procedure: BIOPSY;  Surgeon: Danie Binder, MD;  [facility];  Service: Endoscopy;;  gastric biopsy  . CARDIAC CATHETERIZATION  05-11-2003  dr Shelva Majestic ([facility])   Abnormal cardiolite/   Normal coronary arteries and normal LVF,  ef  63%  . CARDIOVASCULAR STRESS TEST  01-01-2014   normal lexiscan cardiolite/  no ischemia/ infarct/  normal LV function and wall motion ,  ef 81%  . COLONOSCOPY  last one 10/02/2011   [facility] [place]  TICS, IH, NEXT TCS APR 2018  . COLONOSCOPY WITH PROPOFOL N/A 02/26/2017   Procedure: COLONOSCOPY WITH PROPOFOL;  Surgeon: Danie Binder, MD;  [facility];  Service: Endoscopy;  Laterality: N/A;  11:00am  . CYSTO/   RIGHT URETEROSOCOPY LASER LITHOTRIPSY STONE EXTRACTION  10-13-2004  . CYSTO/  RIGHT RETROGRADE PYELOGRAM/  PLACMENT RIGHT URETERAL STENT  01-10-2010  . CYSTOSCOPY W/ URETERAL STENT PLACEMENT Right 08/19/2015   Procedure: CYSTOSCOPY WITH RETROGRADE PYELOGRAM/URETERAL STENT PLACEMENT;  Surgeon: Franchot Gallo, MD;  Location: [facility];  Service: Urology;  Laterality: Right;  . CYSTOSCOPY WITH RETROGRADE PYELOGRAM, URETEROSCOPY AND STENT PLACEMENT Left 10/21/2014   Procedure: CYSTOSCOPY WITH RETROGRADE PYELOGRAM, URETEROSCOPY AND STENT PLACEMENT;  Surgeon: Franchot Gallo, MD;  Location: [facility];  Service: Urology;  Laterality: Left;  . CYSTOSCOPY WITH RETROGRADE PYELOGRAM, URETEROSCOPY AND STENT PLACEMENT Right 11/22/2015   Procedure: CYSTOSCOPY, RIGHT URETERAL STENT REMOVAL; RIGHT RETROGRADE PYELOGRAM, RIGHT URETEROSCOPY WITH BALLOON DILATION; RIGHT URETERAL STENT PLACEMENT;  Surgeon: Franchot Gallo, MD;  [facility];  Service: Urology;  Laterality: Right;  . CYSTOSCOPY/RETROGRADE/URETEROSCOPY/STONE EXTRACTION WITH BASKET Bilateral 08/02/2015   Procedure: CYSTOSCOPY; BILATERAL RETROGRADE PYELOGRAMS; BILATERAL URETEROSCOPY, STONE EXTRACTION WITH BASKET;  Surgeon: Franchot Gallo, MD;  [facility];  Service: Urology;  Laterality: Bilateral;  . CYSTOSCOPY/RETROGRADE/URETEROSCOPY/STONE EXTRACTION WITH BASKET Right 06/28/2015   Procedure: CYSTOSCOPY/RETROGRADE/URETEROSCOPY/STONE EXTRACTION WITH BASKET, RIGHT URETERAL DOUBLE J STENT PLACEMENT;  Surgeon: Franchot Gallo, MD;  [facility];  Service: Urology;  Laterality: Right;  . ESOPHAGOGASTRODUODENOSCOPY (EGD) WITH PROPOFOL N/A 03/17/2013   Procedure: ESOPHAGOGASTRODUODENOSCOPY (EGD) WITH  PROPOFOL;  Surgeon: Danie Binder, MD;  [facility];  Service: Endoscopy;  Laterality: N/A;  . ESOPHAGOGASTRODUODENOSCOPY (EGD) WITH PROPOFOL N/A 06/10/2015   Distal gastritis, distal esophageal stricture s/p dilation  . ESOPHAGOGASTRODUODENOSCOPY (EGD) WITH PROPOFOL N/A 02/26/2017   Procedure: ESOPHAGOGASTRODUODENOSCOPY (EGD) WITH PROPOFOL;  Surgeon: Danie Binder, MD;  [facility];  Service: Endoscopy;  Laterality: N/A;  . FLEXIBLE SIGMOIDOSCOPY  09/11/2011   RXV:[id number] Internal hemorrhoids  . HOLMIUM LASER APPLICATION Left 9/50/9326   Procedure: HOLMIUM LASER APPLICATION;  Surgeon: Franchot Gallo, MD;  [facility];  Service: Urology;  Laterality: Left;  . HOLMIUM LASER APPLICATION Right 12/21/4578   Procedure: HOLMIUM LASER APPLICATION;  Surgeon: Franchot Gallo, MD;  [facility]: [facility];  Service: Urology;  Laterality: Right;  . MASS EXCISION Left 03/04/2015   Procedure: EXCISION OF SOFT TISSUE NEOPLASM LEFT ARM;  Surgeon: Aviva Signs, MD;  Location: [facility];  Service: General;  Laterality: Left;  . PERCUTANEOUS NEPHROSTOLITHOTOMY Bilateral 12/  2011     [facility]  . POLYPECTOMY N/A 03/17/2013   Procedure: GASTRIC POLYPECTOMY;  Surgeon: Danie Binder, MD;  [facility];  Service: Endoscopy;  Laterality: N/A;  . POLYPECTOMY  02/26/2017  Procedure: POLYPECTOMY;  Surgeon: Danie Binder, MD;  [facility];  Service: Endoscopy;;  polyp at cecum, ascending colon polyps x3, hepatic flexure polyps x8, transverse colon polyps x8   . REMOVAL RIGHT THIGH CYST  2006  . SAVORY DILATION N/A 03/17/2013   Procedure: SAVORY DILATION;  Surgeon: Danie Binder, MD;  Location: [facility];  Service: Endoscopy;  Laterality: N/A;  #12.8, 14, 15, 16 dilators used  . SAVORY DILATION N/A 06/10/2015   Procedure: SAVORY DILATION;  Surgeon: Danie Binder, MD;  [facility];  Service: Endoscopy;  Laterality: N/A;  . TRANSTHORACIC ECHOCARDIOGRAM   01-01-2014   mild LVH/  ef 11-57%/  grade I diastolic dysfunction/  trivial MR, TR, and PR  . VAGINAL HYSTERECTOMY  1970's     Current Meds  Medication Sig  . ALPRAZolam (XANAX) 0.5 MG tablet Take one tablet at bedtime for sleep and anxiety, by mouth (Patient taking differently: Take 0.5 mg by mouth at bedtime. )  . amLODipine (NORVASC) 10 MG tablet TAKE 1 TABLET BY MOUTH EVERY DAY (Patient taking differently: Take 10 mg by mouth daily. )  . aspirin EC 325 MG tablet Take 325 mg by mouth daily.  . calcium-vitamin D (OSCAL WITH D) 500-200 MG-UNIT tablet Take 1 tablet by mouth daily.  . ciprofloxacin (CIPRO) 500 MG tablet Take 1 tablet (500 mg total) by mouth every 12 (twelve) hours.  . furosemide (LASIX) 40 MG tablet Take 1 tablet (40 mg total) by mouth daily as needed for fluid.  . hydrOXYzine (ATARAX/VISTARIL) 25 MG tablet Take 1 tablet (25 mg total) by mouth Nightly.  . loperamide (IMODIUM A-D) 2 MG tablet Take 1 tablet (2 mg total) by mouth 2 (two) times daily as needed for diarrhea or loose stools.  . lovastatin (MEVACOR) 40 MG tablet TAKE 1 TABLET AT BEDTIME  . Melatonin 10 MG TABS Take 10 mg by mouth at bedtime.  . ondansetron (ZOFRAN ODT) 4 MG disintegrating tablet Take 1 tablet (4 mg total) by mouth every 8 (eight) hours as needed.  . pantoprazole (PROTONIX) 40 MG tablet TAKE 1 TABLET (40 MG TOTAL) BY MOUTH 2 (TWO) TIMES DAILY.  Marland Kitchen PARoxetine (PAXIL) 40 MG tablet TAKE 1 TABLET BY MOUTH EVERY DAY IN THE MORNING  . potassium chloride (K-DUR) 10 MEQ tablet Take 1 tablet (10 mEq total) by mouth daily.  . SYMBICORT 80-4.5 MCG/ACT inhaler INHALE 2 PUFFS INTO THE LUNGS TWICE A DAY  . tiZANidine (ZANAFLEX) 4 MG tablet TAKE 1 TABLET BY MOUTH EVERY 6 HOURS AS NEEDED FOR MUSCLE SPASMS.  [facility] ophthalmic ointment Place 1 application into the right eye 2 (two) times daily.  . TRADJENTA 5 MG TABS tablet TAKE 1 TABLET BY MOUTH EVERY DAY  . traZODone (DESYREL) 100 MG tablet Take 100 mg by mouth  at bedtime.     Allergies:   Ace inhibitors, Keflex [cephalexin], Nitrofurantoin, and Penicillins   Social History   Tobacco Use  . Smoking status: Former Smoker    Packs/day: 1.00    Years: 20.00    Pack years: 20.00    Types: Cigarettes    Start date: 09/21/1974    Quit date: 09/20/1988    Years since quitting: 30.3  . Smokeless tobacco: Never Used  . Tobacco comment: Quit x 20 years  Substance Use Topics  . Alcohol use: No    Alcohol/week: 0.0 standard drinks  . Drug use: No     Family Hx: The patient's family  history includes Arthritis in an other family member; Cancer in an other family member; Dementia in her mother; Diabetes in her brother, mother, and another family member; Emphysema in her father; GER disease in her brother; Heart defect in an other family member; Heart failure in her mother; Hypertension in her father, mother, sister, and sister. There is no history of Anesthesia problems, Hypotension, Malignant hyperthermia, Pseudochol deficiency, or Colon cancer.  ROS:   Please see the history of present illness.    All other systems reviewed and are negative.   Labs/Other Tests and Data Reviewed:    Recent Labs: 05/21/2018: Magnesium 2.3 01/03/2019: Hemoglobin 11.5; Platelets 202 01/09/2019: ALT 13; BUN 9; Creat 1.49; Potassium 4.0; Sodium 136   Recent Lipid Panel Lab Results  Component Value Date/Time   CHOL 162 11/19/2018 12:13 PM   TRIG 121 11/19/2018 12:13 PM   HDL 47 11/19/2018 12:13 PM   CHOLHDL 3.4 11/19/2018 12:13 PM   LDLCALC 91 11/19/2018 12:13 PM   LDLCALC 103 (H) 05/21/2018 01:08 PM    Wt Readings from Last 3 Encounters:  02/05/19 205 lb (93 kg)  01/19/19 200 lb (90.7 kg)  01/15/19 204 lb (92.5 kg)     Objective:    Vital Signs:  Ht 5\' 6"  (1.676 m)   Wt 205 lb (93 kg)   BMI 33.09 kg/m    GEN:  alert and oriented RESPIRATORY:  no shortness of breath noted in conversation PSYCH:  normal mood and affect   ASSESSMENT & PLAN:      1. Nausea and vomiting, intractability of vomiting not specified, unspecified vomiting type Recently seen at UC, gave zofran which she reports is not helping. Reports she is not taking her protonix like she should. I educated her on this and told her to start taking it BID as directed on bottle. Suspect uncontrolled GERD or gastroparesis? Suggested she follow up with GI for adjust to meds if needed. Do not think phenergan is a good idea at this time. Maybe reglan if GI thinks. Encouraged good hydration.  I appreciate collaboration in patient's plan of care. Please let PCP know if assistance from [place] is needed.    2. Gastroesophageal reflux disease without esophagitis As stated above, not taking protonix as ordered. Might have GERD flare up.   Time:   Today, I have spent 10 minutes with the patient with telehealth technology discussing the above problems.     Medication Adjustments/Labs and Tests Ordered: Current medicines are reviewed at length with the patient today.  Concerns regarding medicines are outlined above.   Tests Ordered: No orders of the defined types were placed in this encounter.   Medication Changes: No orders of the defined types were placed in this encounter.   Disposition:  Follow up 03/12/2019  Signed, Perlie Mayo, NP  02/05/2019 1:54 PM     [facility]

## 2019-02-05 NOTE — Patient Instructions (Signed)
    Thank you for completing your visit over the phone. I appreciate the opportunity to provide you with the care for your health and wellness. Today we discussed: n/v  Follow Up: as needed, scheduled to return in Oct  No labs today  Please follow up with GI if taking Protonix twice daily as directed does not improve symptoms.  Please continue to practice social distancing to keep you, your family, and our community safe.  If you must go out, please wear a Mask and practice good handwashing.  [place] YOUR HANDS WELL AND FREQUENTLY. AVOID TOUCHING YOUR FACE, UNLESS YOUR HANDS ARE FRESHLY WASHED.  GET FRESH AIR DAILY. STAY HYDRATED WITH WATER.   It was a pleasure to see you and I look forward to continuing to work together on your health and well-being. Please do not hesitate to call the office if you need care or have questions about your care.  Have a wonderful day and week.  With Gratitude,  Cherly Beach, DNP, AGNP-BC

## 2019-02-06 ENCOUNTER — Encounter (HOSPITAL_COMMUNITY): Payer: Self-pay | Admitting: *Deleted

## 2019-02-06 ENCOUNTER — Other Ambulatory Visit: Payer: Self-pay

## 2019-02-06 ENCOUNTER — Emergency Department (HOSPITAL_COMMUNITY)
Admission: EM | Admit: 2019-02-06 | Discharge: 2019-02-07 | Disposition: A | Discharge status: Home / Self Care | Payer: Medicare Other | Attending: Emergency Medicine | Admitting: Emergency Medicine

## 2019-02-06 DIAGNOSIS — Z79899 Other long term (current) drug therapy: Secondary | ICD-10-CM | POA: Diagnosis not present

## 2019-02-06 DIAGNOSIS — Z87891 Personal history of nicotine dependence: Secondary | ICD-10-CM | POA: Diagnosis not present

## 2019-02-06 DIAGNOSIS — Z8579 Personal history of other malignant neoplasms of lymphoid, hematopoietic and related tissues: Secondary | ICD-10-CM | POA: Diagnosis not present

## 2019-02-06 DIAGNOSIS — N183 Chronic kidney disease, stage 3 (moderate): Secondary | ICD-10-CM | POA: Insufficient documentation

## 2019-02-06 DIAGNOSIS — E1122 Type 2 diabetes mellitus with diabetic chronic kidney disease: Secondary | ICD-10-CM | POA: Insufficient documentation

## 2019-02-06 DIAGNOSIS — I129 Hypertensive chronic kidney disease with stage 1 through stage 4 chronic kidney disease, or unspecified chronic kidney disease: Secondary | ICD-10-CM | POA: Diagnosis not present

## 2019-02-06 DIAGNOSIS — Z960 Presence of urogenital implants: Secondary | ICD-10-CM | POA: Insufficient documentation

## 2019-02-06 DIAGNOSIS — N390 Urinary tract infection, site not specified: Secondary | ICD-10-CM | POA: Diagnosis not present

## 2019-02-06 DIAGNOSIS — R197 Diarrhea, unspecified: Secondary | ICD-10-CM | POA: Insufficient documentation

## 2019-02-06 DIAGNOSIS — R112 Nausea with vomiting, unspecified: Secondary | ICD-10-CM | POA: Diagnosis not present

## 2019-02-06 DIAGNOSIS — K5900 Constipation, unspecified: Secondary | ICD-10-CM

## 2019-02-06 LAB — COMPREHENSIVE METABOLIC PANEL
ALT: 19 U/L (ref 0–44)
AST: 27 U/L (ref 15–41)
Albumin: 4.6 g/dL (ref 3.5–5.0)
Alkaline Phosphatase: 70 U/L (ref 38–126)
Anion gap: 13 (ref 5–15)
BUN: 18 mg/dL (ref 8–23)
CO2: 24 mmol/L (ref 22–32)
Calcium: 10.8 mg/dL — ABNORMAL HIGH (ref 8.9–10.3)
Chloride: 107 mmol/L (ref 98–111)
Creatinine, Ser: 1.53 mg/dL — ABNORMAL HIGH (ref 0.44–1.00)
GFR calc Af Amer: 38 mL/min — ABNORMAL LOW (ref >60)
GFR calc non Af Amer: 33 mL/min — ABNORMAL LOW (ref >60)
Glucose, Bld: 110 mg/dL — ABNORMAL HIGH (ref 70–99)
Potassium: 3.9 mmol/L (ref 3.5–5.1)
Sodium: 144 mmol/L (ref 135–145)
Total Bilirubin: 0.3 mg/dL (ref 0.3–1.2)
Total Protein: 9.7 g/dL — ABNORMAL HIGH (ref 6.5–8.1)

## 2019-02-06 LAB — CBC
HCT: 42.4 % (ref 36.0–46.0)
Hemoglobin: 13.2 g/dL (ref 12.0–15.0)
MCH: 30.9 pg (ref 26.0–34.0)
MCHC: 31.1 g/dL (ref 30.0–36.0)
MCV: 99.3 fL (ref 80.0–100.0)
Platelets: 381 10*3/uL (ref 150–400)
RBC: 4.27 MIL/uL (ref 3.87–5.11)
RDW: 15.8 % — ABNORMAL HIGH (ref 11.5–15.5)
WBC: 10.9 10*3/uL — ABNORMAL HIGH (ref 4.0–10.5)
nRBC: 0 % (ref 0.0–0.2)

## 2019-02-06 LAB — CBG MONITORING, ED: Glucose-Capillary: 126 mg/dL — ABNORMAL HIGH (ref 70–99)

## 2019-02-06 LAB — LIPASE, BLOOD: Lipase: 38 U/L (ref 11–51)

## 2019-02-06 MED ORDER — ONDANSETRON 4 MG PO TBDP
4.0000 mg | ORAL_TABLET | Freq: Once | ORAL | Status: AC | PRN
  Administered 2019-02-06: 4 mg via ORAL
  Filled 2019-02-06: qty 1

## 2019-02-06 NOTE — ED Notes (Signed)
ED Provider at bedside. 

## 2019-02-06 NOTE — ED Notes (Signed)
Pt states that she may leave, RN encouraged pt to stay and get treated. Pt agrees to stay for "a little longer"

## 2019-02-06 NOTE — ED Notes (Signed)
Pt upset, questions RN if she was placed in a regular room, advised pt that she was in a medical room, pt states " what about all those other pt's" RN explained to pt that she was in a medical room and that it was actually the only room available at the moment, RN placed two blankets on pt and gave pt socks per request,

## 2019-02-06 NOTE — ED Notes (Signed)
Patient called out wanting staff to come to room. Upon entering room patient very belligerent, stating that she has [employer] and [employer] and she should have a better room and staff should treat her better. States that she needs to be feed, but patient is here for emesis. RN and Agricultural consultant made aware.

## 2019-02-06 NOTE — ED Triage Notes (Signed)
Patient presents to the ED with nausea and vomiting and diarrhea for 2 days. Patient states she has vomited 3-4 times in the last 2 days.  Patient doesn't know if she has had a fever.

## 2019-02-07 LAB — URINALYSIS, ROUTINE W REFLEX MICROSCOPIC
Bilirubin Urine: NEGATIVE
Glucose, UA: NEGATIVE mg/dL
Ketones, ur: NEGATIVE mg/dL
Nitrite: NEGATIVE
Protein, ur: 100 mg/dL — AB
RBC / HPF: >50 RBC/hpf — ABNORMAL HIGH (ref 0–5)
Specific Gravity, Urine: 1.019 (ref 1.005–1.030)
WBC, UA: >50 WBC/hpf — ABNORMAL HIGH (ref 0–5)
pH: 5 (ref 5.0–8.0)

## 2019-02-07 MED ORDER — ONDANSETRON HCL 4 MG PO TABS: 4.0000 mg | ORAL_TABLET | Freq: Three times a day (TID) | ORAL | 0 refills | Status: DC | PRN

## 2019-02-07 MED ORDER — SULFAMETHOXAZOLE-TRIMETHOPRIM 800-160 MG PO TABS
1.0000 | ORAL_TABLET | Freq: Once | ORAL | Status: AC
  Administered 2019-02-07: 1 via ORAL
  Filled 2019-02-07: qty 1

## 2019-02-07 MED ORDER — SULFAMETHOXAZOLE-TRIMETHOPRIM 800-160 MG PO TABS: 1.0000 | ORAL_TABLET | Freq: Two times a day (BID) | ORAL | 0 refills | Status: DC

## 2019-02-07 NOTE — ED Notes (Signed)
Pt requesting prescription for xanax and something for cramps, RN advised pt that she would need to follow up with pcp for xanax prescription and asked if she needed to speak with Dr Tomi Bamberger again about the cramps, pt refused, stated " it is all in my chart, she should know"

## 2019-02-07 NOTE — ED Notes (Signed)
Pt called her niece for a ride home,

## 2019-02-07 NOTE — ED Notes (Signed)
Patient able to give urine specimen at this time. Patient left sample in bathroom. Entered room to obtain patient's labels patient lying on stomach, states to this NT " You need to cover me up."

## 2019-02-07 NOTE — ED Notes (Signed)
Pt ambulatory out of room, ,  had taken her hospital gown off and put her dress and was carrying her purse, RN asked pt if she was leaving, pt stated no that she needed to use the restroom, pt given directions to restroom was upset states " this is filfthy" Rn offered pt another restroom, pt refused and closed the door in front of RN,

## 2019-02-07 NOTE — Discharge Instructions (Addendum)
You have refused to allow me to examine your abdomen.  You may have a urinary tract infection.  Take the antibiotics as prescribed.  Please follow-up with Dr. Moshe Cipro this week if you continue to have problems.  If you get worse and want to be examined, you can return to the emergency department.

## 2019-02-07 NOTE — ED Provider Notes (Signed)
[facility] EMERGENCY DEPARTMENT Provider Note   CSN: [id number] Arrival date & time: 02/06/19  1655   Time seen 11:35 PM  History   Chief Complaint Chief Complaint  Patient presents with   Emesis    HPI Cheryl Abbott is a 73 y.o. female.     HPI patient states she has been having symptoms "off and on since the last time I was here".  She states she was admitted for 4 days for dehydration and UTI.  When I look through her chart that appeared to be July 24.  She states she still felt weak when she left the hospital however she was not having pain.  She complains of lack of appetite.  She states she started having nausea and vomiting 2 days ago.  She states she vomited once today and once yesterday.  She states she has been having diarrhea once to twice a day that she describes as loose.  She however later told me she did not have any diarrhea today and even further on in our conversation she states she felt like she was constipated and she had not had any bowel movements for 2 days.  She denies any fever or chills.  She complains of decreased urinary output.  She denies chest pain, shortness of breath, or cough.  She denies abdominal pain.  She states the reason she came in tonight is because she was sweating profusely after lunch today.  She states she felt weak and when she got up she felt a little short of breath although she denied being short of breath to me when I asked before.  She states she felt hot.  She states she feels "the same as the last time I was admitted".  She also complains of a lot of abdominal bloating and starting having urinary and rectal incontinence since her last admission and she now has to wear incontinence panties.  Patient is very upset when I enter the room.  She is complaining about her room, she is complaining about being uncomfortable, she complains about the room being too cold.  She also threatens me that "you better not touch me with cold hands".  PCP  [name], MD   Past Medical History:  Diagnosis Date   Anemia    Anxiety    Anxiety disorder    Arthritis    Cancer (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   Chronic back pain    Depression    Fatty liver    GERD (gastroesophageal reflux disease)    Headache    History of adenomatous polyp of colon    2008   History of kidney stones    Hypercalcemia    Hyperlipidemia    Hypertension    Left ureteral stone    Lipoma of arm 01/19/2015   Lumbar disc disease with radiculopathy    Nephrolithiasis    Neuropathy, peripheral    OSA treated with BiPAP    per study 2007   Sciatica    Shoulder pain, bilateral    Sigmoid diverticulosis    Type 2 diabetes mellitus [facility])     Patient Active Problem List   Diagnosis Date Noted   Eyelid cyst, right 01/15/2019   Urinary tract infection with hematuria 01/04/2019   Sepsis ([place]) 01/02/2019   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   Shoulder pain, left 02/28/2018   Palpable mass of neck 02/28/2018   Exertional dyspnea 02/28/2018   Insomnia  11/09/2017   Multiple myeloma ([place]) 03/15/2017   Urinary frequency 02/28/2017   Monoclonal paraproteinemia 02/15/2017   Vaginal dryness, menopausal 10/14/2016   Neuropathic pain of both feet 04/11/2016   TIA (transient ischemic attack) 02/26/2016   Lactose intolerance in adult 02/01/2016   Brain TIA 08/19/2015   CKD (chronic kidney disease) stage 3, GFR 30-59 ml/min (HCC) 07/12/2015   Lipoma of arm 01/19/2015   Anemia 10/14/2014   Lumbar back pain with radiculopathy affecting left lower extremity 05/11/2014   Nocturnal hypoxia 02/04/2014   Obesity (BMI 30.0-34.9) 01/02/2014   Generalized anxiety disorder 05/20/2013   Anxiety and depression 08/13/2012   Abnormal brain scan 02/07/2012   Thoracic or lumbosacral neuritis or radiculitis, unspecified 12/22/2010   Hypercalcemia 11/30/2010   Colon adenomas  05/11/2009   HEMORRHOIDS 03/15/2009   GERD 03/15/2009   FATTY LIVER DISEASE 03/15/2009   RENAL CALCULUS 03/15/2009   Sleep apnea 09/15/2008   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   Hyperlipidemia LDL goal <100 09/17/2007   Hypertension goal BP (blood pressure) < 130/80 09/17/2007    Past Surgical History:  Procedure Laterality Date   BIOPSY N/A 03/17/2013   Procedure: GASTRIC BIOPSIES;  Surgeon: Danie Binder, MD;  Location: [facility];  Service: Endoscopy;  Laterality: N/A;   BIOPSY  06/10/2015   Procedure: BIOPSY;  Surgeon: Danie Binder, MD;  Location: [facility];  Service: Endoscopy;;  gastric biopsy   CARDIAC CATHETERIZATION  05-11-2003  dr Shelva Majestic ([facility])   Abnormal cardiolite/   Normal coronary arteries and normal LVF,  ef  63%   CARDIOVASCULAR STRESS TEST  01-01-2014   normal lexiscan cardiolite/  no ischemia/ infarct/  normal LV function and wall motion ,  ef 81%   COLONOSCOPY  last one 10/02/2011   MOD [place] TICS, IH, NEXT TCS APR 2018   COLONOSCOPY WITH PROPOFOL N/A 02/26/2017   Procedure: COLONOSCOPY WITH PROPOFOL;  Surgeon: Danie Binder, MD;  Location: [facility];  Service: Endoscopy;  Laterality: N/A;  11:00am   CYSTO/   RIGHT URETEROSOCOPY LASER LITHOTRIPSY STONE EXTRACTION  10-13-2004   CYSTO/  RIGHT RETROGRADE PYELOGRAM/  PLACMENT RIGHT URETERAL STENT  01-10-2010   CYSTOSCOPY W/ URETERAL STENT PLACEMENT Right 08/19/2015   Procedure: CYSTOSCOPY WITH RETROGRADE PYELOGRAM/URETERAL STENT PLACEMENT;  Surgeon: Franchot Gallo, MD;  Location: [facility];  Service: Urology;  Laterality: Right;   CYSTOSCOPY WITH RETROGRADE PYELOGRAM, URETEROSCOPY AND STENT PLACEMENT Left 10/21/2014   Procedure: CYSTOSCOPY WITH RETROGRADE PYELOGRAM, URETEROSCOPY AND STENT PLACEMENT;  Surgeon: Franchot Gallo, MD;  Location: [facility];  Service: Urology;  Laterality: Left;   CYSTOSCOPY WITH RETROGRADE PYELOGRAM, URETEROSCOPY  AND STENT PLACEMENT Right 11/22/2015   Procedure: CYSTOSCOPY, RIGHT URETERAL STENT REMOVAL; RIGHT RETROGRADE PYELOGRAM, RIGHT URETEROSCOPY WITH BALLOON DILATION; RIGHT URETERAL STENT PLACEMENT;  Surgeon: Franchot Gallo, MD;  Location: [facility];  Service: Urology;  Laterality: Right;   CYSTOSCOPY/RETROGRADE/URETEROSCOPY/STONE EXTRACTION WITH BASKET Bilateral 08/02/2015   Procedure: CYSTOSCOPY; BILATERAL RETROGRADE PYELOGRAMS; BILATERAL URETEROSCOPY, STONE EXTRACTION WITH BASKET;  Surgeon: Franchot Gallo, MD;  Location: [facility];  Service: Urology;  Laterality: Bilateral;   CYSTOSCOPY/RETROGRADE/URETEROSCOPY/STONE EXTRACTION WITH BASKET Right 06/28/2015   Procedure: CYSTOSCOPY/RETROGRADE/URETEROSCOPY/STONE EXTRACTION WITH BASKET, RIGHT URETERAL DOUBLE J STENT PLACEMENT;  Surgeon: Franchot Gallo, MD;  Location: [facility];  Service: Urology;  Laterality: Right;   ESOPHAGOGASTRODUODENOSCOPY (EGD) WITH PROPOFOL N/A 03/17/2013   Procedure: ESOPHAGOGASTRODUODENOSCOPY (EGD) WITH PROPOFOL;  Surgeon: Danie Binder, MD;  Location: [facility];  Service: Endoscopy;  Laterality: N/A;   ESOPHAGOGASTRODUODENOSCOPY (EGD) WITH PROPOFOL N/A 06/10/2015   Distal gastritis, distal esophageal stricture s/p dilation   ESOPHAGOGASTRODUODENOSCOPY (EGD) WITH PROPOFOL N/A 02/26/2017   Procedure: ESOPHAGOGASTRODUODENOSCOPY (EGD) WITH PROPOFOL;  Surgeon: Danie Binder, MD;  [facility];  Service: Endoscopy;  Laterality: N/A;   FLEXIBLE SIGMOIDOSCOPY  09/11/2011   CWU:[id number] Internal hemorrhoids   HOLMIUM LASER APPLICATION Left 0/38/8828   Procedure: HOLMIUM LASER APPLICATION;  Surgeon: Franchot Gallo, MD;  Location: [facility];  Service: Urology;  Laterality: Left;   HOLMIUM LASER APPLICATION Right 0/08/4915   Procedure: HOLMIUM LASER APPLICATION;  Surgeon: Franchot Gallo, MD;  Location: [facility];  Service: Urology;  Laterality: Right;   MASS EXCISION Left 03/04/2015   Procedure: EXCISION OF  SOFT TISSUE NEOPLASM LEFT ARM;  Surgeon: Aviva Signs, MD;  Location: [facility];  Service: General;  Laterality: Left;   PERCUTANEOUS NEPHROSTOLITHOTOMY Bilateral 12/  2011     [facility]   POLYPECTOMY N/A 03/17/2013   Procedure: GASTRIC POLYPECTOMY;  Surgeon: Danie Binder, MD;  Location: [facility];  Service: Endoscopy;  Laterality: N/A;   POLYPECTOMY  02/26/2017   Procedure: POLYPECTOMY;  Surgeon: Danie Binder, MD;  [facility];  Service: Endoscopy;;  polyp at cecum, ascending colon polyps x3, hepatic flexure polyps x8, transverse colon polyps x8    REMOVAL RIGHT THIGH CYST  2006   SAVORY DILATION N/A 03/17/2013   Procedure: SAVORY DILATION;  Surgeon: Danie Binder, MD;  Location: [facility];  Service: Endoscopy;  Laterality: N/A;  #12.8, 14, 15, 16 dilators used   SAVORY DILATION N/A 06/10/2015   Procedure: SAVORY DILATION;  Surgeon: Danie Binder, MD;  [facility];  Service: Endoscopy;  Laterality: N/A;   TRANSTHORACIC ECHOCARDIOGRAM  01-01-2014   mild LVH/  ef 91-50%/  grade I diastolic dysfunction/  trivial MR, TR, and PR   VAGINAL HYSTERECTOMY  1970's     OB History    Gravida  2   Para  2   Term  2   Preterm      AB      Living  2     SAB      TAB      Ectopic      Multiple      Live Births               Home Medications    Prior to Admission medications   Medication Sig Start Date End Date Taking? Authorizing Provider  ALPRAZolam Duanne Moron) 0.5 MG tablet Take one tablet at bedtime for sleep and anxiety, by mouth Patient taking differently: Take 0.5 mg by mouth at bedtime.  10/22/18   [name], MD  amLODipine (NORVASC) 10 MG tablet TAKE 1 TABLET BY MOUTH EVERY DAY Patient taking differently: Take 10 mg by mouth daily.  12/06/17 02/05/19  [name], MD  aspirin EC 325 MG tablet Take 325 mg by mouth daily.    [provider]  calcium-vitamin D (OSCAL WITH D) 500-200 MG-UNIT tablet Take 1 tablet by mouth daily.     [provider]  ciprofloxacin (CIPRO) 500 MG tablet Take 1 tablet (500 mg total) by mouth every 12 (twelve) hours. 01/19/19   Virgel Manifold, MD  furosemide (LASIX) 40 MG tablet Take 1 tablet (40 mg total) by mouth daily as needed for fluid. 01/10/19   Johnson, Clanford L, MD  hydrOXYzine (ATARAX/VISTARIL) 25 MG tablet Take 1 tablet (25 mg  total) by mouth Nightly. 02/02/19   Noe Gens, PA-C  loperamide (IMODIUM A-D) 2 MG tablet Take 1 tablet (2 mg total) by mouth 2 (two) times daily as needed for diarrhea or loose stools. 01/08/19   Perlie Mayo, NP  lovastatin (MEVACOR) 40 MG tablet TAKE 1 TABLET AT BEDTIME 01/27/19   [name], MD  Melatonin 10 MG TABS Take 10 mg by mouth at bedtime.    [provider]  ondansetron (ZOFRAN ODT) 4 MG disintegrating tablet Take 1 tablet (4 mg total) by mouth every 8 (eight) hours as needed. 02/02/19   Noe Gens, PA-C  ondansetron (ZOFRAN) 4 MG tablet Take 1 tablet (4 mg total) by mouth every 8 (eight) hours as needed. 02/07/19   Rolland Porter, MD  pantoprazole (PROTONIX) 40 MG tablet TAKE 1 TABLET (40 MG TOTAL) BY MOUTH 2 (TWO) TIMES DAILY. 06/19/18   [name], MD  PARoxetine (PAXIL) 40 MG tablet TAKE 1 TABLET BY MOUTH EVERY DAY IN THE MORNING 01/12/19   [name], MD  potassium chloride (K-DUR) 10 MEQ tablet Take 1 tablet (10 mEq total) by mouth daily. 05/06/18   Julianne Rice, MD  sulfamethoxazole-trimethoprim (BACTRIM DS) 800-160 MG tablet Take 1 tablet by mouth 2 (two) times daily. 02/07/19   Rolland Porter, MD  SYMBICORT 80-4.5 MCG/ACT inhaler INHALE 2 PUFFS INTO THE LUNGS TWICE A DAY 11/26/18   Perlie Mayo, NP  tiZANidine (ZANAFLEX) 4 MG tablet TAKE 1 TABLET BY MOUTH EVERY 6 HOURS AS NEEDED FOR MUSCLE SPASMS. 02/02/19   Carole Civil, MD  TOBRADEX ophthalmic ointment Place 1 application into the right eye 2 (two) times daily. 01/30/19   [provider]  TRADJENTA 5 MG TABS tablet TAKE 1 TABLET BY  MOUTH EVERY DAY 06/19/18   [name], MD  traZODone (DESYREL) 100 MG tablet Take 100 mg by mouth at bedtime.    [provider]    Family History Family History  Problem Relation Age of Onset   Hypertension Mother    Diabetes Mother    Heart failure Mother    Dementia Mother    Emphysema Father    Hypertension Father    Diabetes Brother    GER disease Brother    Hypertension Sister    Hypertension Sister    Cancer Other        family history    Diabetes Other        family history    Heart defect Other        famiily history    Arthritis Other        family history    Anesthesia problems Neg Hx    Hypotension Neg Hx    Malignant hyperthermia Neg Hx    Pseudochol deficiency Neg Hx    Colon cancer Neg Hx     Social History Social History   Tobacco Use   Smoking status: Former Smoker    Packs/day: 1.00    Years: 20.00    Pack years: 20.00    Types: Cigarettes    Start date: 09/21/1974    Quit date: 09/20/1988    Years since quitting: 30.4   Smokeless tobacco: Never Used   Tobacco comment: Quit x 20 years  Substance Use Topics   Alcohol use: No    Alcohol/week: 0.0 standard drinks   Drug use: No     Allergies   Ace inhibitors, Keflex [cephalexin], Nitrofurantoin, and Penicillins   Review  of Systems Review of Systems  All other systems reviewed and are negative.    Physical Exam Updated Vital Signs BP 115/86    Pulse 87    Temp 98.4 F (36.9 C) (Oral)    Resp 16    Wt 90.7 kg    SpO2 95%    BMI 32.28 kg/m   Physical Exam Vitals signs and nursing note reviewed.  Constitutional:      General: She is not in acute distress.    Appearance: Normal appearance. She is well-developed. She is not ill-appearing or toxic-appearing.  HENT:     Head: Normocephalic and atraumatic.     Right Ear: External ear normal.     Left Ear: External ear normal.     Nose: Nose normal. No mucosal edema or rhinorrhea.      Mouth/Throat:     Mouth: Mucous membranes are dry.     Dentition: No dental abscesses.     Pharynx: Oropharynx is clear. No uvula swelling.  Eyes:     Extraocular Movements: Extraocular movements intact.     Conjunctiva/sclera: Conjunctivae normal.     Pupils: Pupils are equal, round, and reactive to light.  Neck:     Musculoskeletal: Full passive range of motion without pain, normal range of motion and neck supple.  Cardiovascular:     Rate and Rhythm: Normal rate and regular rhythm.     Heart sounds: Normal heart sounds. No murmur. No friction rub. No gallop.   Pulmonary:     Effort: Pulmonary effort is normal. No respiratory distress.     Breath sounds: Normal breath sounds. No wheezing, rhonchi or rales.     Comments: I kept patient's gown covering her chest and she complained about the stethoscope feeling cold. Chest:     Chest wall: No tenderness or crepitus.  Abdominal:     Comments: Due to patient concerned that I may have cold hands I left her down on her body when I attempted to palpate her abdomen she immediately grabbed my hands and told me to stop and said it made her "uncomfortable".  I explained to her that is why needed to exam her abdomen however she refuses to have her abdomen examined.  Musculoskeletal: Normal range of motion.        General: No tenderness.     Comments: Moves all extremities well.   Skin:    General: Skin is warm and dry.     Coloration: Skin is not pale.     Findings: No erythema or rash.  Neurological:     Mental Status: She is alert and oriented to person, place, and time.     Cranial Nerves: No cranial nerve deficit.  Psychiatric:        Mood and Affect: Mood is not anxious.        Speech: Speech normal.        Behavior: Behavior normal.      ED Treatments / Results  Labs (all labs ordered are listed, but only abnormal results are displayed) Results for orders placed or performed during the hospital encounter of 02/06/19  Lipase,  blood  Result Value Ref Range   Lipase 38 11 - 51 U/L  Comprehensive metabolic panel  Result Value Ref Range   Sodium 144 135 - 145 mmol/L   Potassium 3.9 3.5 - 5.1 mmol/L   Chloride 107 98 - 111 mmol/L   CO2 24 22 - 32 mmol/L   Glucose, Bld 110 (H) 70 -  99 mg/dL   BUN 18 8 - 23 mg/dL   Creatinine, Ser 1.53 (H) 0.44 - 1.00 mg/dL   Calcium 10.8 (H) 8.9 - 10.3 mg/dL   Total Protein 9.7 (H) 6.5 - 8.1 g/dL   Albumin 4.6 3.5 - 5.0 g/dL   AST 27 15 - 41 U/L   ALT 19 0 - 44 U/L   Alkaline Phosphatase 70 38 - 126 U/L   Total Bilirubin 0.3 0.3 - 1.2 mg/dL   GFR calc non Af Amer 33 (L) >60 mL/min   GFR calc Af Amer 38 (L) >60 mL/min   Anion gap 13 5 - 15  CBC  Result Value Ref Range   WBC 10.9 (H) 4.0 - 10.5 K/uL   RBC 4.27 3.87 - 5.11 MIL/uL   Hemoglobin 13.2 12.0 - 15.0 g/dL   HCT 42.4 36.0 - 46.0 %   MCV 99.3 80.0 - 100.0 fL   MCH 30.9 26.0 - 34.0 pg   MCHC 31.1 30.0 - 36.0 g/dL   RDW 15.8 (H) 11.5 - 15.5 %   Platelets 381 150 - 400 K/uL   nRBC 0.0 0.0 - 0.2 %  Urinalysis, Routine w reflex microscopic  Result Value Ref Range   Color, Urine YELLOW YELLOW   APPearance CLOUDY (A) CLEAR   Specific Gravity, Urine 1.019 1.005 - 1.030   pH 5.0 5.0 - 8.0   Glucose, UA NEGATIVE NEGATIVE mg/dL   Hgb urine dipstick MODERATE (A) NEGATIVE   Bilirubin Urine NEGATIVE NEGATIVE   Ketones, ur NEGATIVE NEGATIVE mg/dL   Protein, ur 100 (A) NEGATIVE mg/dL   Nitrite NEGATIVE NEGATIVE   Leukocytes,Ua MODERATE (A) NEGATIVE   RBC / HPF >50 (H) 0 - 5 RBC/hpf   WBC, UA >50 (H) 0 - 5 WBC/hpf   Bacteria, UA RARE (A) NONE SEEN   Squamous Epithelial / LPF 11-20 0 - 5   Mucus PRESENT    Ca Oxalate Crys, UA PRESENT   CBG monitoring, ED  Result Value Ref Range   Glucose-Capillary 126 (H) 70 - 99 mg/dL   *Note: Due to a large number of results and/or encounters for the requested time period, some results have not been displayed. A complete set of results can be found in Results Review.   Laboratory  interpretation all normal except stable renal insufficiency, possible UTI however lots of squamous cells also present    EKG None  Radiology No results found.  Procedures Procedures (including critical care time)  Medications Ordered in ED Medications  sulfamethoxazole-trimethoprim (BACTRIM DS) 800-160 MG per tablet 1 tablet (has no administration in time range)  ondansetron (ZOFRAN-ODT) disintegrating tablet 4 mg (4 mg Oral Given 02/06/19 2037)     Initial Impression / Assessment and Plan / ED Course  I have reviewed the triage vital signs and the nursing notes.  Pertinent labs & imaging results that were available during my care of the patient were reviewed by me and considered in my medical decision making (see chart for details).   Patient would not let me examine her abdomen.  She gave a confusing history with complaints of diarrhea but then having no bowel movements and then being constipated.  I asked her what she needed for me to do for her tonight and she could not answer, she states "I want to go home to get comfortable".  When I review her prior urinary cultures, they were -August 10, July 24, and on March 11, 2018 she had 80,000 diphtheroids.  Also  when I review her chart she was admitted on July 24.  Urine culture was sent, I started her preemptively on Septra DS.     Final Clinical Impressions(s) / ED Diagnoses   Final diagnoses:  Nausea vomiting and diarrhea  Constipation, unspecified constipation type  Urinary tract infection without hematuria, site unspecified    ED Discharge Orders         Ordered    sulfamethoxazole-trimethoprim (BACTRIM DS) 800-160 MG tablet  2 times daily     02/07/19 0136    ondansetron (ZOFRAN) 4 MG tablet  Every 8 hours PRN     02/07/19 0136         Plan discharge  Rolland Porter, MD, Barbette Or, MD 02/07/19 [phone]

## 2019-02-08 LAB — URINE CULTURE
Culture: <10000 — AB
Special Requests: NORMAL

## 2019-02-10 ENCOUNTER — Encounter: Payer: Self-pay | Admitting: Gastroenterology

## 2019-02-11 ENCOUNTER — Other Ambulatory Visit: Payer: Self-pay | Admitting: Family Medicine

## 2019-02-18 DIAGNOSIS — R0902 Hypoxemia: Secondary | ICD-10-CM | POA: Diagnosis not present

## 2019-02-23 ENCOUNTER — Other Ambulatory Visit: Payer: Self-pay | Admitting: Family Medicine

## 2019-02-27 IMAGING — DX DG ABDOMEN 2V
2 series · 2 of 2 positions shown · non-contrast
Comparison: None.

CLINICAL DATA: Kidney stone extraction.

EXAM:
ABDOMEN - 2 VIEW

[abdomen erect]
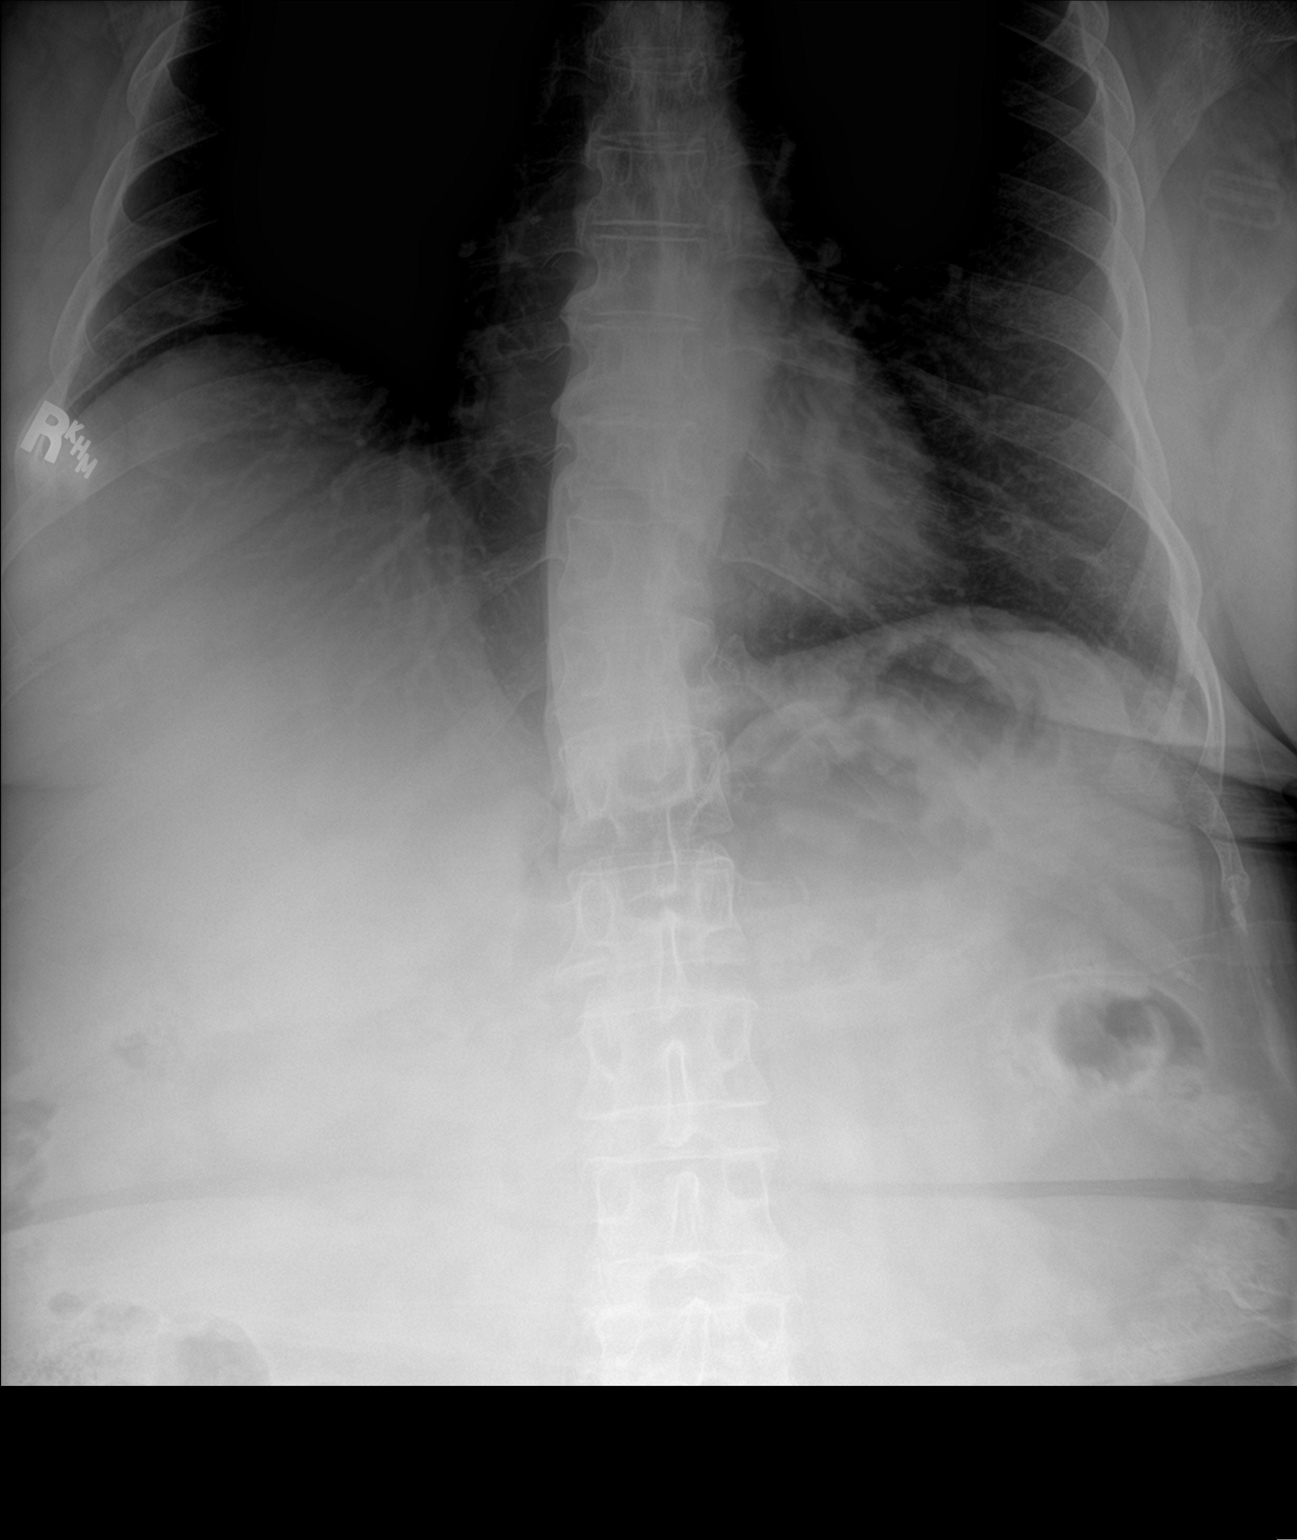

[abdomen supine]
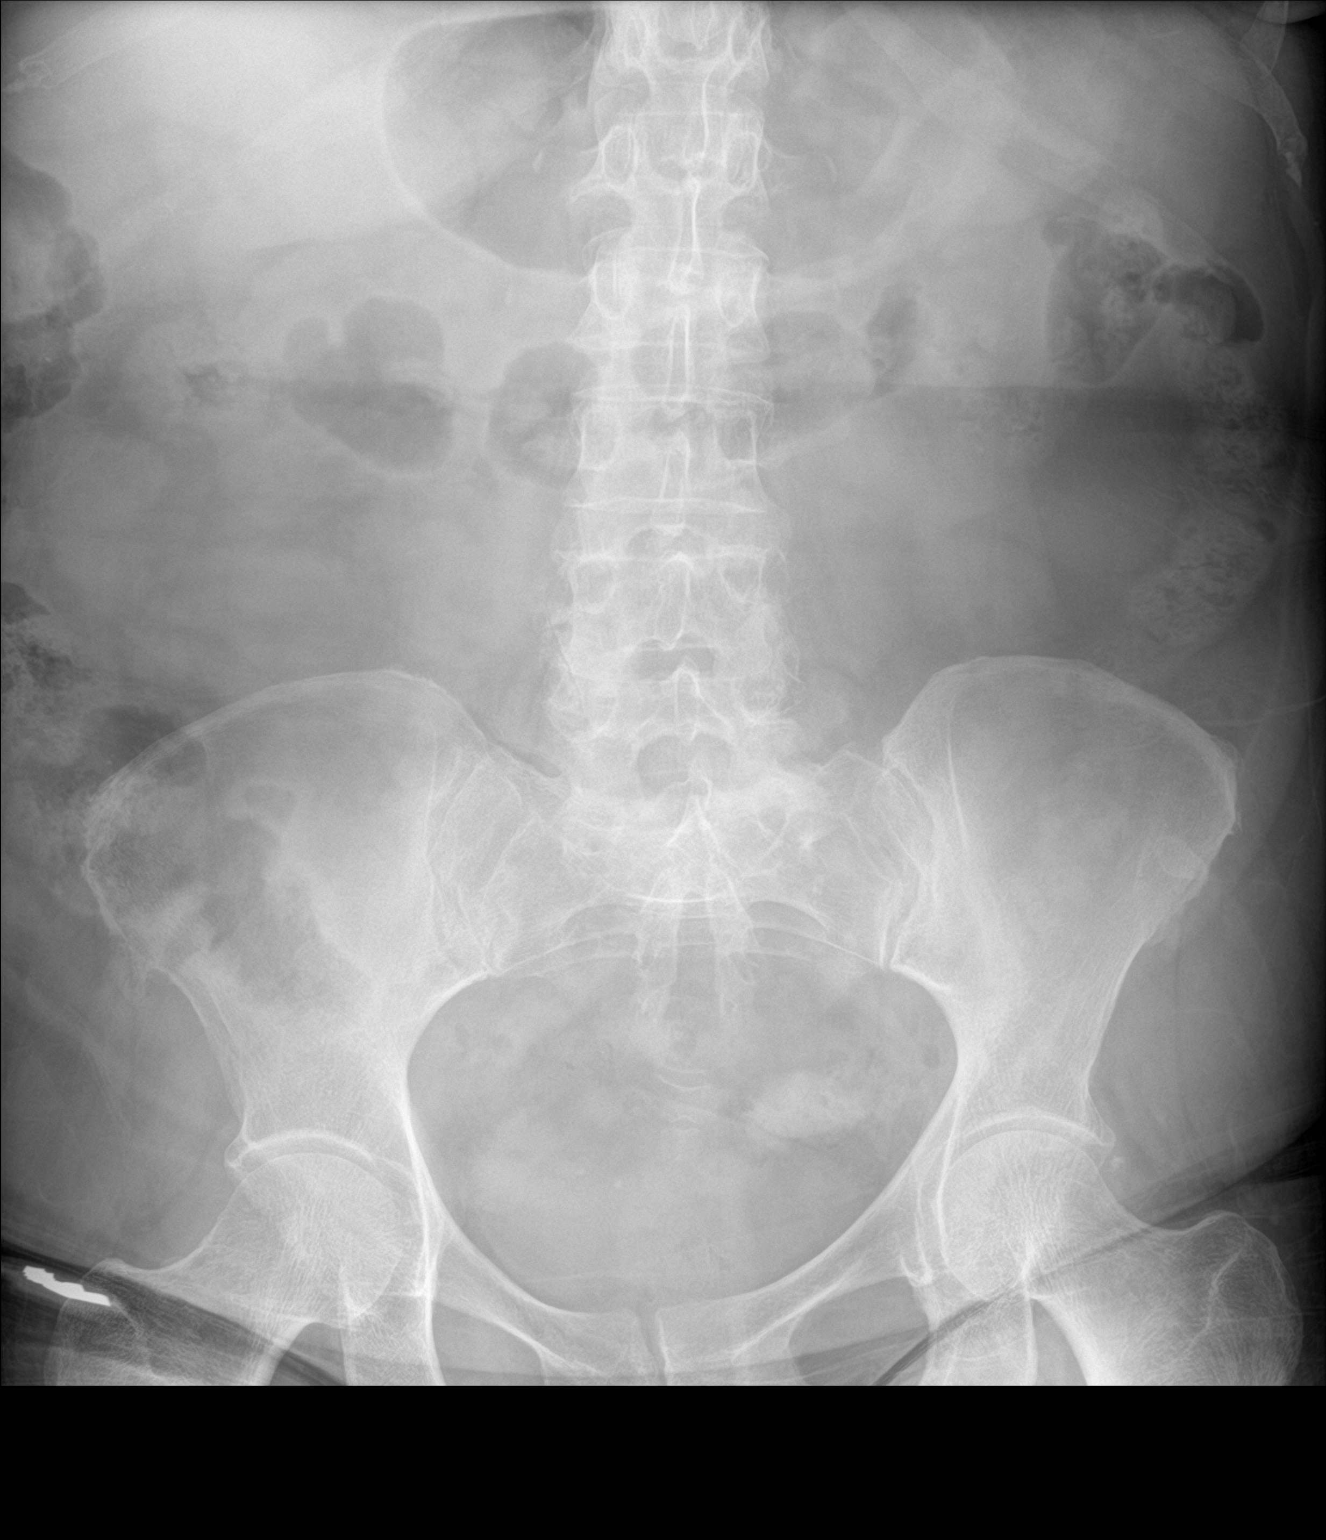

[2 of 2 positions shown; findings below may reference images not displayed]

FINDINGS: Nonobstructive gas pattern. No definite calculi are seen. No osseous
findings of significance. Chronically elevated RIGHT hemidiaphragm.
IMPRESSION: Unremarkable two-view abdomen.

## 2019-03-02 ENCOUNTER — Telehealth: Payer: Self-pay | Admitting: Orthopedic Surgery

## 2019-03-02 ENCOUNTER — Other Ambulatory Visit: Payer: Self-pay | Admitting: Orthopedic Surgery

## 2019-03-02 MED ORDER — DICLOFENAC SODIUM 1 % TD GEL: 4.0000 g | Freq: Four times a day (QID) | TRANSDERMAL | 3 refills | Status: DC

## 2019-03-02 NOTE — Telephone Encounter (Signed)
Ok, done 

## 2019-03-02 NOTE — Telephone Encounter (Signed)
Patient called to relay that her appointment at [facility] is 03/31/19. States her pain in back of neck and right shoulder is bothering her quite a lot; asking if there is something Dr Aline Brochure can prescribe, such as a topical medication "that might numb it some", until her appointment there. Pharmacy is [facility] [place]. Patient's home ph# is [phone] - states safe to leave a message.

## 2019-03-04 ENCOUNTER — Telehealth: Payer: Self-pay

## 2019-03-04 NOTE — Telephone Encounter (Signed)
Patient called stating that she had a MRI done and Dr. Aline Brochure never went over it with her and she has questions. Stated he could call her at home if he wants to so she can ask him questions.

## 2019-03-04 NOTE — Telephone Encounter (Signed)
Dr Althia Forts phone note from 01/16/2019 states he called her. He had me refer her to Dr Vertell Limber. She has appointment with him on 04/06/2019.  I called patient, I am a little concerned she does not remember talking to Dr Aline Brochure, I left message for her to call me back.

## 2019-03-10 NOTE — Telephone Encounter (Signed)
I called again, no answer. Unable to reach.

## 2019-03-12 ENCOUNTER — Other Ambulatory Visit: Payer: Self-pay

## 2019-03-12 ENCOUNTER — Encounter: Payer: Self-pay | Admitting: Family Medicine

## 2019-03-12 ENCOUNTER — Ambulatory Visit (INDEPENDENT_AMBULATORY_CARE_PROVIDER_SITE_OTHER): Payer: Medicare Other | Admitting: Family Medicine

## 2019-03-12 ENCOUNTER — Other Ambulatory Visit: Payer: Self-pay | Admitting: Family Medicine

## 2019-03-12 VITALS — BP 120/80 | HR 100 | Temp 97.4°F | Resp 16 | Ht 66.0 in | Wt 199.4 lb

## 2019-03-12 DIAGNOSIS — F324 Major depressive disorder, single episode, in partial remission: Secondary | ICD-10-CM | POA: Diagnosis not present

## 2019-03-12 DIAGNOSIS — Z0000 Encounter for general adult medical examination without abnormal findings: Secondary | ICD-10-CM | POA: Diagnosis not present

## 2019-03-12 DIAGNOSIS — M5416 Radiculopathy, lumbar region: Secondary | ICD-10-CM | POA: Diagnosis not present

## 2019-03-12 DIAGNOSIS — F5104 Psychophysiologic insomnia: Secondary | ICD-10-CM | POA: Diagnosis not present

## 2019-03-12 MED ORDER — ALPRAZOLAM 0.5 MG PO TABS: ORAL_TABLET | ORAL | 0 refills | Status: DC

## 2019-03-12 MED ORDER — METHYLPREDNISOLONE ACETATE 80 MG/ML IJ SUSP
80.0000 mg | Freq: Once | INTRAMUSCULAR | Status: AC
  Administered 2019-03-12: 80 mg via INTRAMUSCULAR

## 2019-03-12 MED ORDER — BUSPIRONE HCL 5 MG PO TABS: ORAL_TABLET | ORAL | 4 refills | Status: DC

## 2019-03-12 MED ORDER — KETOROLAC TROMETHAMINE 60 MG/2ML IM SOLN
60.0000 mg | Freq: Once | INTRAMUSCULAR | Status: AC
  Administered 2019-03-12: 60 mg via INTRAMUSCULAR

## 2019-03-12 MED ORDER — PAROXETINE HCL 40 MG PO TABS: 40.0000 mg | ORAL_TABLET | ORAL | 4 refills | Status: DC

## 2019-03-12 NOTE — Assessment & Plan Note (Signed)

## 2019-03-12 NOTE — Progress Notes (Signed)
    Cheryl Abbott     MRN: [id number]      DOB: Apr 27, 1946  HPI: Patient is in for annual physical exam. Left sciatica to knee  Recent labs, if available are reviewed. Immunization is reviewed , and  Is up to date   PE: BP 120/80   Pulse 100   Temp (!) 97.4 F (36.3 C) (Temporal)   Resp 16   Ht 5\' 6"  (1.676 m)   Wt 199 lb 6.4 oz (90.4 kg)   SpO2 93%   BMI 32.18 kg/m   Pleasant  female, alert and oriented x 3, in no cardio-pulmonary distress. Afebrile. HEENT No facial trauma or asymetry. .  Extra occullar muscles intact, External ears normal, . Neck: supple, no adenopathy,JVD or thyromegaly.No bruits.  Chest: Clear to ascultation bilaterally.No crackles or wheezes. Non tender to palpation  Breast: Denies masses, has mammogram in Nov 2020 Denies lumps or pain, no exam at visit  Cardiovascular system; Heart sounds normal,  S1 and  S2 ,no S3.  No murmur, or thrill. Apical beat not displaced Peripheral pulses normal.  Abdomen: Soft, non tender,  No guarding, tenderness or rebound.    Musculoskeletal exam: Decreased  ROM of lumbar spine and left  hip , adequate in shoulders and knees.  deformity ,swelling and  crepitus noted in kneees No muscle wasting or atrophy.   Neurologic: Cranial nerves 2 to 12 intact. Power, tone ,sensation normal throughout. No disturbance in gait. No tremor.  Skin: Intact, no ulceration, erythema , scaling or rash noted. Pigmentation normal throughout  Psych; Slightly depressed mood and affect. Judgement and concentration normal   Assessment & Plan:   Annual physical exam Annual exam as documented. Counseling done  re healthy lifestyle involving commitment to 150 minutes exercise per week, heart healthy diet, and attaining healthy weight.The importance of adequate sleep also discussed. Regular seat belt use and home safety, is also discussed. Changes in health habits are decided on by the patient with goals and time frames   set for achieving them. Immunization and cancer screening needs are specifically addressed at this visit.   Insomnia Managed with trazodone and has been dependent on xanax to help with sleep induction. Actively reducing xanax dosing, with a view to totally stopping this in the next several months. New is buspar  5 mg at bedtime . Limited supply of xanax is prescribed, pt is I agreement with the plan  Lumbar back pain with radiculopathy affecting left lower extremity Uncontrolled.Toradol and depo medrol administered IM in the office   Depression, major, single episode, in partial remission ([place]) Pt has been off paxil for several weeks, states that shewas told no script on file, record review shows that this is not the case, paxil is re sent at her chronic dose, she resisted a lower dose stating that she has been more depressed off her dose of maintainance

## 2019-03-12 NOTE — Patient Instructions (Signed)
F/u in office with Md in January, call if you nee me sooner  Injections today for back pain, toradol and depo medrol   Please get labs this month at the hospital ordered in August  Thanks for choosing [facility], we consider it a privelige to serve you.  New for anxiety and sleep is buspar 5 mg one at night  Limited xanax prescribed for use only if needed, the hope is that you will no longer need this due to side effects

## 2019-03-13 ENCOUNTER — Encounter: Payer: Self-pay | Admitting: Family Medicine

## 2019-03-13 DIAGNOSIS — F324 Major depressive disorder, single episode, in partial remission: Secondary | ICD-10-CM | POA: Insufficient documentation

## 2019-03-13 NOTE — Assessment & Plan Note (Signed)
Pt has been off paxil for several weeks, states that shewas told no script on file, record review shows that this is not the case, paxil is re sent at her chronic dose, she resisted a lower dose stating that she has been more depressed off her dose of maintainance

## 2019-03-13 NOTE — Assessment & Plan Note (Signed)
Uncontrolled.Toradol and depo medrol administered IM in the office . 

## 2019-03-13 NOTE — Assessment & Plan Note (Signed)
Managed with trazodone and has been dependent on xanax to help with sleep induction. Actively reducing xanax dosing, with a view to totally stopping this in the next several months. New is buspar  5 mg at bedtime . Limited supply of xanax is prescribed, pt is I agreement with the plan

## 2019-03-20 DIAGNOSIS — R0902 Hypoxemia: Secondary | ICD-10-CM | POA: Diagnosis not present

## 2019-03-23 ENCOUNTER — Telehealth: Payer: Self-pay | Admitting: Family Medicine

## 2019-03-23 ENCOUNTER — Ambulatory Visit: Payer: Medicare Other

## 2019-03-23 NOTE — Telephone Encounter (Signed)
Pt LVM that the medicine that she was given the last time for sleep is making her dizzy, can something else be called in

## 2019-03-24 ENCOUNTER — Other Ambulatory Visit: Payer: Self-pay | Admitting: Family Medicine

## 2019-03-24 NOTE — Telephone Encounter (Signed)
States the buspar is making her dizzy and is wanting something else called in

## 2019-03-24 NOTE — Telephone Encounter (Signed)
No alternative so I guess she will go back to bedtime alprazolam every night, will send script, I was trying to wean her, advise try half buspar if can be sp[lit, if not stop it, let mer know if willing / able to split

## 2019-03-25 ENCOUNTER — Telehealth: Payer: Self-pay | Admitting: *Deleted

## 2019-03-25 ENCOUNTER — Telehealth: Payer: Self-pay | Admitting: Family Medicine

## 2019-03-25 MED ORDER — BUDESONIDE-FORMOTEROL FUMARATE 80-4.5 MCG/ACT IN AERO: INHALATION_SPRAY | RESPIRATORY_TRACT | 3 refills | Status: DC

## 2019-03-25 NOTE — Telephone Encounter (Signed)
symbicort refilled

## 2019-03-25 NOTE — Telephone Encounter (Signed)
Pt called needing a refill on her symbicort she is out of the medication

## 2019-03-25 NOTE — Telephone Encounter (Signed)
Please call in Symbicort to the pharmacy

## 2019-03-25 NOTE — Telephone Encounter (Signed)
Med refilled.

## 2019-03-26 NOTE — Telephone Encounter (Signed)
Called patient and left message for them to return call at the office   

## 2019-03-27 ENCOUNTER — Other Ambulatory Visit: Payer: Self-pay | Admitting: Family Medicine

## 2019-03-27 ENCOUNTER — Inpatient Hospital Stay (HOSPITAL_COMMUNITY): Payer: Medicare Other | Attending: Hematology

## 2019-03-27 ENCOUNTER — Other Ambulatory Visit: Payer: Self-pay

## 2019-03-27 DIAGNOSIS — D472 Monoclonal gammopathy: Secondary | ICD-10-CM

## 2019-03-27 DIAGNOSIS — K219 Gastro-esophageal reflux disease without esophagitis: Secondary | ICD-10-CM | POA: Insufficient documentation

## 2019-03-27 DIAGNOSIS — D649 Anemia, unspecified: Secondary | ICD-10-CM | POA: Diagnosis not present

## 2019-03-27 DIAGNOSIS — Z87891 Personal history of nicotine dependence: Secondary | ICD-10-CM | POA: Diagnosis not present

## 2019-03-27 DIAGNOSIS — Z86010 Personal history of colonic polyps: Secondary | ICD-10-CM | POA: Insufficient documentation

## 2019-03-27 DIAGNOSIS — I10 Essential (primary) hypertension: Secondary | ICD-10-CM | POA: Insufficient documentation

## 2019-03-27 DIAGNOSIS — C9000 Multiple myeloma not having achieved remission: Secondary | ICD-10-CM | POA: Insufficient documentation

## 2019-03-27 DIAGNOSIS — F419 Anxiety disorder, unspecified: Secondary | ICD-10-CM | POA: Diagnosis not present

## 2019-03-27 DIAGNOSIS — M129 Arthropathy, unspecified: Secondary | ICD-10-CM | POA: Diagnosis not present

## 2019-03-27 DIAGNOSIS — G629 Polyneuropathy, unspecified: Secondary | ICD-10-CM | POA: Diagnosis not present

## 2019-03-27 DIAGNOSIS — Z79899 Other long term (current) drug therapy: Secondary | ICD-10-CM | POA: Insufficient documentation

## 2019-03-27 DIAGNOSIS — E119 Type 2 diabetes mellitus without complications: Secondary | ICD-10-CM | POA: Insufficient documentation

## 2019-03-27 DIAGNOSIS — E785 Hyperlipidemia, unspecified: Secondary | ICD-10-CM | POA: Diagnosis not present

## 2019-03-27 LAB — CBC WITH DIFFERENTIAL/PLATELET
Abs Immature Granulocytes: 0.03 10*3/uL (ref 0.00–0.07)
Basophils Absolute: 0 10*3/uL (ref 0.0–0.1)
Basophils Relative: 1 %
Eosinophils Absolute: 0.1 10*3/uL (ref 0.0–0.5)
Eosinophils Relative: 2 %
HCT: 41.8 % (ref 36.0–46.0)
Hemoglobin: 13.1 g/dL (ref 12.0–15.0)
Immature Granulocytes: 0 %
Lymphocytes Relative: 20 %
Lymphs Abs: 1.6 10*3/uL (ref 0.7–4.0)
MCH: 30.4 pg (ref 26.0–34.0)
MCHC: 31.3 g/dL (ref 30.0–36.0)
MCV: 97 fL (ref 80.0–100.0)
Monocytes Absolute: 0.7 10*3/uL (ref 0.1–1.0)
Monocytes Relative: 9 %
Neutro Abs: 5.4 10*3/uL (ref 1.7–7.7)
Neutrophils Relative %: 68 %
Platelets: 315 10*3/uL (ref 150–400)
RBC: 4.31 MIL/uL (ref 3.87–5.11)
RDW: 14.6 % (ref 11.5–15.5)
WBC: 7.9 10*3/uL (ref 4.0–10.5)
nRBC: 0 % (ref 0.0–0.2)

## 2019-03-27 LAB — COMPREHENSIVE METABOLIC PANEL
ALT: 20 U/L (ref 0–44)
AST: 21 U/L (ref 15–41)
Albumin: 4.3 g/dL (ref 3.5–5.0)
Alkaline Phosphatase: 74 U/L (ref 38–126)
Anion gap: 10 (ref 5–15)
BUN: 19 mg/dL (ref 8–23)
CO2: 23 mmol/L (ref 22–32)
Calcium: 10 mg/dL (ref 8.9–10.3)
Chloride: 99 mmol/L (ref 98–111)
Creatinine, Ser: 1.81 mg/dL — ABNORMAL HIGH (ref 0.44–1.00)
GFR calc Af Amer: 31 mL/min — ABNORMAL LOW (ref >60)
GFR calc non Af Amer: 27 mL/min — ABNORMAL LOW (ref >60)
Glucose, Bld: 102 mg/dL — ABNORMAL HIGH (ref 70–99)
Potassium: 4.6 mmol/L (ref 3.5–5.1)
Sodium: 132 mmol/L — ABNORMAL LOW (ref 135–145)
Total Bilirubin: 0.3 mg/dL (ref 0.3–1.2)
Total Protein: 9.4 g/dL — ABNORMAL HIGH (ref 6.5–8.1)

## 2019-03-27 LAB — LACTATE DEHYDROGENASE: LDH: 93 U/L — ABNORMAL LOW (ref 98–192)

## 2019-03-27 LAB — VITAMIN B12: Vitamin B-12: 646 pg/mL (ref 180–914)

## 2019-03-27 LAB — VITAMIN D 25 HYDROXY (VIT D DEFICIENCY, FRACTURES): Vit D, 25-Hydroxy: 84.74 ng/mL (ref 30–100)

## 2019-03-27 MED ORDER — TRAZODONE HCL 100 MG PO TABS: 100.0000 mg | ORAL_TABLET | Freq: Every day | ORAL | 5 refills | Status: DC

## 2019-03-27 NOTE — Progress Notes (Signed)
traz

## 2019-03-29 LAB — PROTEIN ELECTROPHORESIS, SERUM
A/G Ratio: 0.9 (ref 0.7–1.7)
Albumin ELP: 4 g/dL (ref 2.9–4.4)
Alpha-1-Globulin: 0.2 g/dL (ref 0.0–0.4)
Alpha-2-Globulin: 1.1 g/dL — ABNORMAL HIGH (ref 0.4–1.0)
Beta Globulin: 1.3 g/dL (ref 0.7–1.3)
Gamma Globulin: 2 g/dL — ABNORMAL HIGH (ref 0.4–1.8)
Globulin, Total: 4.7 g/dL — ABNORMAL HIGH (ref 2.2–3.9)
M-Spike, %: 1.3 g/dL — ABNORMAL HIGH
Total Protein ELP: 8.7 g/dL — ABNORMAL HIGH (ref 6.0–8.5)

## 2019-03-30 LAB — KAPPA/LAMBDA LIGHT CHAINS
Kappa free light chain: 62 mg/L — ABNORMAL HIGH (ref 3.3–19.4)
Kappa, lambda light chain ratio: 3.5 — ABNORMAL HIGH (ref 0.26–1.65)
Lambda free light chains: 17.7 mg/L (ref 5.7–26.3)

## 2019-04-01 ENCOUNTER — Ambulatory Visit (INDEPENDENT_AMBULATORY_CARE_PROVIDER_SITE_OTHER): Payer: Medicare Other | Admitting: Family Medicine

## 2019-04-01 ENCOUNTER — Other Ambulatory Visit: Payer: Self-pay

## 2019-04-01 ENCOUNTER — Encounter: Payer: Self-pay | Admitting: Family Medicine

## 2019-04-01 VITALS — BP 116/60 | HR 87 | Temp 98.1°F | Resp 15 | Ht 66.5 in | Wt 200.1 lb

## 2019-04-01 DIAGNOSIS — F339 Major depressive disorder, recurrent, unspecified: Secondary | ICD-10-CM | POA: Diagnosis not present

## 2019-04-01 DIAGNOSIS — Z0000 Encounter for general adult medical examination without abnormal findings: Secondary | ICD-10-CM | POA: Diagnosis not present

## 2019-04-01 DIAGNOSIS — F411 Generalized anxiety disorder: Secondary | ICD-10-CM

## 2019-04-01 NOTE — Progress Notes (Signed)
Subjective:   Cheryl Abbott is a 73 y.o. female who presents for Medicare Annual (Subsequent) preventive examination.  Location of Patient: Home Location of Provider: Telehealth Consent was obtain for visit to be over via telehealth. I verified that I am speaking with the correct person using two identifiers.  Review of Systems:    Cardiac Risk Factors include: advanced age (>56men, >7 women);hypertension;obesity (BMI >30kg/m2);diabetes mellitus Concerns today outside of [facility]:  Feeling very isolated due to COVID. Reports feeling dizzy and having many falls. Is not driving due to car pulling to the left.  And her not feeling safe to drive. Did not think Buspar was helpful, it made her dizzy. In review of her medication she reports that she is taking Tylenol PM for her arthritis and back pain, trazodone, Xanax, melatonin at x2.  She reports that the other day she fell it was because she became disoriented even though she was at home.  It took her a while to figure out where she was. Does not want therapy at all.  Does not want to be told she is "crazy"  She reports that she needs to have neck surgery but she is concerned about that and worried about that she mentions multiple times that she feels that she does not understand or wants to know what her prognosis would be. Additionally because she is not driving because of her dizziness she reports that she is having trouble getting a ride to get to where she needs to go     Objective:     Vitals: BP 116/60   Pulse 87   Temp 98.1 F (36.7 C) (Temporal)   Resp 15   Ht 5' 6.5" (1.689 m)   Wt 200 lb 1.3 oz (90.8 kg)   SpO2 93%   BMI 31.81 kg/m   Body mass index is 31.81 kg/m.  Advanced Directives 02/06/2019 01/19/2019 01/02/2019 01/02/2019 11/28/2018 07/29/2018 07/16/2018  Does Patient Have a Medical Advance Directive? Yes No No Yes No No No  Type of Advance Directive Living will - Healthcare Power of [place]  Does patient want to make changes to medical advance directive? No - Patient declined No - Patient declined No - Patient declined No - Patient declined - - -  Copy of Door in Chart? - - No - copy requested No - copy requested - - -  Would patient like information on creating a medical advance directive? No - Patient declined No - Patient declined No - Patient declined No - Patient declined No - Patient declined - -  Pre-existing out of facility DNR order (yellow form or pink MOST form) - - - - - - -    Tobacco Social History   Tobacco Use  Smoking Status Former Smoker  . Packs/day: 1.00  . Years: 20.00  . Pack years: 20.00  . Types: Cigarettes  . Start date: 09/21/1974  . Quit date: 09/20/1988  . Years since quitting: 30.5  Smokeless Tobacco Never Used  Tobacco Comment   Quit x 20 years     Counseling given: Yes Comment: Quit x 20 years   Clinical Intake:  Pre-visit preparation completed: Yes  Pain : No/denies pain Pain Score: 0-No pain     BMI - recorded: 31.81 Nutritional Status: BMI > 30  Obese Nutritional Risks: None Diabetes: Yes CBG done?: No Did pt. bring in CBG monitor from home?: No  How often do you  need to have someone help you when you read instructions, pamphlets, or other written materials from your doctor or pharmacy?: 1 - Never What is the last grade level you completed in school?: 12  Interpreter Needed?: No     Past Medical History:  Diagnosis Date  . Anemia   . Anxiety   . Anxiety disorder   . Arthritis   . Cancer ([place])    acute myeloma  . 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 Chronic back pain   . Depression   . Fatty liver   . GERD (gastroesophageal reflux disease)   . Headache   . History of adenomatous polyp of colon    2008  . History of kidney stones   . Hypercalcemia   . Hyperlipidemia   . Hypertension   . Left ureteral stone   . Lipoma of arm 01/19/2015  . Lumbar disc disease  with radiculopathy   . Nephrolithiasis   . Neuropathy, peripheral   . OSA treated with BiPAP    per study 2007  . Sciatica   . Shoulder pain, bilateral   . Sigmoid diverticulosis   . Type 2 diabetes mellitus ([place])    Past Surgical History:  Procedure Laterality Date  . BIOPSY N/A 03/17/2013   Procedure: GASTRIC BIOPSIES;  Surgeon: Danie Binder, MD;  Location: [facility];  Service: Endoscopy;  Laterality: N/A;  . BIOPSY  06/10/2015   Procedure: BIOPSY;  Surgeon: Danie Binder, MD;  [facility];  Service: Endoscopy;;  gastric biopsy  . CARDIAC CATHETERIZATION  05-11-2003  dr Shelva Majestic ([facility])   Abnormal cardiolite/   Normal coronary arteries and normal LVF,  ef  63%  . CARDIOVASCULAR STRESS TEST  01-01-2014   normal lexiscan cardiolite/  no ischemia/ infarct/  normal LV function and wall motion ,  ef 81%  . COLONOSCOPY  last one 10/02/2011   [facility] [place] T[facility], IH, NEXT TCS APR 2018  . COLONOSCOPY WITH PROPOFOL N/A 02/26/2017   Procedure: COLONOSCOPY WITH PROPOFOL;  Surgeon: Danie Binder, MD;  [facility];  Service: Endoscopy;  Laterality: N/A;  11:00am  . CYSTO/   RIGHT URETEROSOCOPY LASER LITHOTRIPSY STONE EXTRACTION  10-13-2004  . CYSTO/  RIGHT RETROGRADE PYELOGRAM/  PLACMENT RIGHT URETERAL STENT  01-10-2010  . CYSTOSCOPY W/ URETERAL STENT PLACEMENT Right 08/19/2015   Procedure: CYSTOSCOPY WITH RETROGRADE PYELOGRAM/URETERAL STENT PLACEMENT;  Surgeon: Franchot Gallo, MD;  Location: [facility];  Service: Urology;  Laterality: Right;  . CYSTOSCOPY WITH RETROGRADE PYELOGRAM, URETEROSCOPY AND STENT PLACEMENT Left 10/21/2014   Procedure: CYSTOSCOPY WITH RETROGRADE PYELOGRAM, URETEROSCOPY AND STENT PLACEMENT;  Surgeon: Franchot Gallo, MD;  Location: [facility];  Service: Urology;  Laterality: Left;  . CYSTOSCOPY WITH RETROGRADE PYELOGRAM, URETEROSCOPY AND STENT PLACEMENT Right 11/22/2015   Procedure: CYSTOSCOPY, RIGHT URETERAL STENT  REMOVAL; RIGHT RETROGRADE PYELOGRAM, RIGHT URETEROSCOPY WITH BALLOON DILATION; RIGHT URETERAL STENT PLACEMENT;  Surgeon: Franchot Gallo, MD;  Location: [facility];  Service: Urology;  Laterality: Right;  . CYSTOSCOPY/RETROGRADE/URETEROSCOPY/STONE EXTRACTION WITH BASKET Bilateral 08/02/2015   Procedure: CYSTOSCOPY; BILATERAL RETROGRADE PYELOGRAMS; BILATERAL URETEROSCOPY, STONE EXTRACTION WITH BASKET;  Surgeon: Franchot Gallo, MD;  Location: [facility];  Service: Urology;  Laterality: Bilateral;  . CYSTOSCOPY/RETROGRADE/URETEROSCOPY/STONE EXTRACTION WITH BASKET Right 06/28/2015   Procedure: CYSTOSCOPY/RETROGRADE/URETEROSCOPY/STONE EXTRACTION WITH BASKET, RIGHT URETERAL DOUBLE J STENT PLACEMENT;  Surgeon: Franchot Gallo, MD;  [facility];  Service: Urology;  Laterality: Right;  . ESOPHAGOGASTRODUODENOSCOPY (EGD) WITH PROPOFOL N/A 03/17/2013   Procedure: ESOPHAGOGASTRODUODENOSCOPY (  EGD) WITH PROPOFOL;  Surgeon: Danie Binder, MD;  [facility];  Service: Endoscopy;  Laterality: N/A;  . ESOPHAGOGASTRODUODENOSCOPY (EGD) WITH PROPOFOL N/A 06/10/2015   Distal gastritis, distal esophageal stricture s/p dilation  . ESOPHAGOGASTRODUODENOSCOPY (EGD) WITH PROPOFOL N/A 02/26/2017   Procedure: ESOPHAGOGASTRODUODENOSCOPY (EGD) WITH PROPOFOL;  Surgeon: Danie Binder, MD;  [facility];  Service: Endoscopy;  Laterality: N/A;  . FLEXIBLE SIGMOIDOSCOPY  09/11/2011   [id number] Internal hemorrhoids  . HOLMIUM LASER APPLICATION Left 09/27/6220   Procedure: HOLMIUM LASER APPLICATION;  Surgeon: Franchot Gallo, MD;  Location: [facility];  Service: Urology;  Laterality: Left;  . HOLMIUM LASER APPLICATION Right 9/79/8921   Procedure: HOLMIUM LASER APPLICATION;  Surgeon: Franchot Gallo, MD;  [facility];  Service: Urology;  Laterality: Right;  . MASS EXCISION Left 03/04/2015   Procedure: EXCISION OF SOFT TISSUE NEOPLASM LEFT ARM;  Surgeon: Aviva Signs, MD;  Location: [facility];   Service: General;  Laterality: Left;  . PERCUTANEOUS NEPHROSTOLITHOTOMY Bilateral 12/  2011     [facility]  . POLYPECTOMY N/A 03/17/2013   Procedure: GASTRIC POLYPECTOMY;  Surgeon: Danie Binder, MD;  [facility];  Service: Endoscopy;  Laterality: N/A;  . POLYPECTOMY  02/26/2017   Procedure: POLYPECTOMY;  Surgeon: Danie Binder, MD;  [facility];  Service: Endoscopy;;  polyp at cecum, ascending colon polyps x3, hepatic flexure polyps x8, transverse colon polyps x8   . REMOVAL RIGHT THIGH CYST  2006  . SAVORY DILATION N/A 03/17/2013   Procedure: SAVORY DILATION;  Surgeon: Danie Binder, MD;  Location: [facility];  Service: Endoscopy;  Laterality: N/A;  #12.8, 14, 15, 16 dilators used  . SAVORY DILATION N/A 06/10/2015   Procedure: SAVORY DILATION;  Surgeon: Danie Binder, MD;  [facility];  Service: Endoscopy;  Laterality: N/A;  . TRANSTHORACIC ECHOCARDIOGRAM  01-01-2014   mild LVH/  ef 19-41%/  grade I diastolic dysfunction/  trivial MR, TR, and PR  . VAGINAL HYSTERECTOMY  1970's   Family History  Problem Relation Age of Onset  . Hypertension Mother   . Diabetes Mother   . Heart failure Mother   . Dementia Mother   . Emphysema Father   . Hypertension Father   . Diabetes Brother   . GER disease Brother   . Hypertension Sister   . Hypertension Sister   . Cancer Other        family history   . Diabetes Other        family history   . Heart defect Other        famiily history   . Arthritis Other        family history   . Anesthesia problems Neg Hx   . Hypotension Neg Hx   . Malignant hyperthermia Neg Hx   . Pseudochol deficiency Neg Hx   . Colon cancer Neg Hx    Social History   Socioeconomic History  . Marital status: Divorced    Spouse name: Not on file  . Number of children: 2  . Years of education: Not on file  . Highest education level: Not on file  Occupational History  . Occupation: retired   Scientific laboratory technician  . Financial resource strain: Not  very hard  . Food insecurity    Worry: Never true    Inability: Never true  . Transportation needs    Medical: No    Non-medical: No  Tobacco Use  . Smoking  status: Former Smoker    Packs/day: 1.00    Years: 20.00    Pack years: 20.00    Types: Cigarettes    Start date: 09/21/1974    Quit date: 09/20/1988    Years since quitting: 30.5  . Smokeless tobacco: Never Used  . Tobacco comment: Quit x 20 years  Substance and Sexual Activity  . Alcohol use: No    Alcohol/week: 0.0 standard drinks  . Drug use: No  . Sexual activity: Never    Birth control/protection: Surgical  Lifestyle  . Physical activity    Days per week: 0 days    Minutes per session: 0 min  . Stress: Only a little  Relationships  . Social Herbalist on phone: Not on file    Gets together: Not on file    Attends religious service: Not on file    Active member of club or organization: Not on file    Attends meetings of clubs or organizations: Not on file    Relationship status: Divorced  Other Topics Concern  . Not on file  Social History Narrative   Patient is right handed   Patient drinks some caffeine daily.    Outpatient Encounter Medications as of 04/01/2019  Medication Sig  . ALPRAZolam (XANAX) 0.5 MG tablet Take one tablet at bedtime, as needed, for uncontrolled anxiety  . amLODipine (NORVASC) 10 MG tablet TAKE 1 TABLET BY MOUTH EVERY DAY  . aspirin EC 325 MG tablet Take 325 mg by mouth daily.  . budesonide-formoterol (SYMBICORT) 80-4.5 MCG/ACT inhaler INHALE 2 PUFFS INTO THE LUNGS TWICE A DAY  . calcium-vitamin D (OSCAL WITH D) 500-200 MG-UNIT tablet Take 1 tablet by mouth daily.  . diclofenac sodium (VOLTAREN) 1 % GEL Apply 4 g topically 4 (four) times daily.  . furosemide (LASIX) 40 MG tablet Take 1 tablet (40 mg total) by mouth daily as needed for fluid.  Marland Kitchen lovastatin (MEVACOR) 40 MG tablet TAKE 1 TABLET AT BEDTIME  . Melatonin 10 MG TABS Take 10 mg by mouth at bedtime.  .  pantoprazole (PROTONIX) 40 MG tablet TAKE 1 TABLET (40 MG TOTAL) BY MOUTH 2 (TWO) TIMES DAILY.  Marland Kitchen PARoxetine (PAXIL) 40 MG tablet TAKE 1 TABLET BY MOUTH EVERY DAY IN THE MORNING  . potassium chloride (K-DUR) 10 MEQ tablet Take 1 tablet (10 mEq total) by mouth daily.  [facility] ophthalmic ointment Place 1 application into the right eye 2 (two) times daily.  . TRADJENTA 5 MG TABS tablet TAKE 1 TABLET BY MOUTH EVERY DAY  . traZODone (DESYREL) 100 MG tablet Take 100 mg by mouth at bedtime.  . traZODone (DESYREL) 100 MG tablet Take 1 tablet (100 mg total) by mouth at bedtime.  . busPIRone (BUSPAR) 5 MG tablet Take one tablet by mouth  at bedtime for anxiety (Patient not taking: Reported on 04/01/2019)   No facility-administered encounter medications on file as of 04/01/2019.     Activities of Daily Living In your present state of health, do you have any difficulty performing the following activities: 04/01/2019 01/02/2019  Hearing? N N  Vision? Y N  Comment really small print -  Difficulty concentrating or making decisions? Y N  Comment forgets little things sometimes -  Walking or climbing stairs? Y N  Dressing or bathing? Y N  Doing errands, shopping? - N  Conservation officer, nature and eating ? N -  Using the Toilet? N -  In the past six months, have you accidently  leaked urine? N -  Managing your Medications? N -  Managing your Finances? N -  Housekeeping or managing your Housekeeping? Y -  Some recent data might be hidden    Patient Care Team: [name], MD as PCP - [facility] [place], Alphonse Guild, MD as PCP - Cardiology (Cardiology) Danie Binder, MD (Gastroenterology) Irine Seal, MD as Attending Physician (Urology) Kathrynn Ducking, MD (Neurology) Franchot Gallo, MD as Consulting Physician (Urology) Rutherford Guys, MD as Consulting Physician (Ophthalmology)    Assessment:   This is a routine wellness examination for [place].  Exercise Activities and Dietary  recommendations Current Exercise Habits: The patient does not participate in regular exercise at present, Exercise limited by: None identified  Goals    . Blood Pressure < 140/90    . HEMOGLOBIN A1C < 7.0    . LDL CALC < 100       Fall Risk Fall Risk  04/01/2019 03/12/2019 02/05/2019 01/15/2019 01/08/2019  Falls in the past year? 1 1 1  0 0  Number falls in past yr: 1 1 1  0 -  Injury with Fall? 1 1 1  0 0  Comment - - - - -  Risk Factor Category  - - - - -  Risk for fall due to : History of fall(s);Impaired balance/gait;Impaired mobility;Medication side effect - - - -  Follow up Falls evaluation completed;Education provided - - - -   Is the patient's home free of loose throw rugs in walkways, pet beds, electrical cords, etc?   yes      Grab bars in the bathroom? yes      Handrails on the stairs?   yes      Adequate lighting?   yes     Depression Screen PHQ 2/9 Scores 04/01/2019 03/12/2019 02/05/2019 01/15/2019  PHQ - 2 Score 1 2 0 0  PHQ- 9 Score 13 14 - 9     Cognitive Function     6CIT Screen 04/01/2019 03/24/2018 03/20/2017  What Year? 0 points 0 points 0 points  What month? 0 points 0 points 0 points  What time? 0 points 0 points 0 points  Count back from 20 0 points 0 points 0 points  Months in reverse 0 points 0 points 0 points  Repeat phrase 0 points 2 points 0 points  Total Score 0 2 0    Immunization History  Administered Date(s) Administered  . Influenza Split 03/17/2015  . Influenza Whole 04/15/2007, 03/17/2009  . Influenza,inj,Quad PF,6+ Mos 02/04/2014, 02/12/2017, 02/17/2018  . Influenza-Unspecified 02/02/2019  . Pneumococcal Conjugate-13 12/21/2013  . Pneumococcal Polysaccharide-23 01/19/2004, 05/23/2010, 02/02/2019  . Td 06/07/2003  . Tdap 06/14/2011  . Zoster 02/02/2008    Qualifies for Shingles Vaccine? completed  Screening Tests Health Maintenance  Topic Date Due  . URINE MICROALBUMIN  12/20/2018  . COLONOSCOPY  02/27/2019  . OPHTHALMOLOGY EXAM   05/16/2019  . HEMOGLOBIN A1C  05/21/2019  . FOOT EXAM  12/24/2019  . MAMMOGRAM  05/02/2020  . TETANUS/TDAP  06/13/2021  . INFLUENZA VACCINE  Completed  . DEXA SCAN  Completed  . Hepatitis C Screening  Completed  . PNA vac Low Risk Adult  Completed    Cancer Screenings: Lung: Low Dose CT Chest recommended if Age 17-80 years, 30 pack-year currently smoking OR have quit w/in 15years. Patient does not qualify. Breast:  Up to date on Mammogram? Yes   Up to date of Bone Density/Dexa? Yes  Colorectal:  Due this year   Additional  Screenings:   Hepatitis C Screening: completed     Plan:       1. Encounter for Medicare annual wellness exam  I have personally reviewed and noted the following in the patient's chart:   . Medical and social history . Use of alcohol, tobacco or illicit drugs  . Current mediions and supplements . Functional ability and status . Nutritional status . Physical activity . Advanced directives . List of other physicians . Hospitalizations, surgeries, and ER visits in previous 12 months . Vitals . Screenings to include cognitive, depression, and falls . Referrals and appointments  In addition, I have reviewed and discussed with patient certain preventive protocols, quality metrics, and best practice recommendations. A written personalized care plan for preventive services as well as general preventive health recommendations were provided to patient.    2. Depression, recurrent ([place]) Appears to have uncontrolled depression at this time.  Is against going to therapy as she reports that they will just tell her that she is crazy and is all in her mind.  Reviewed medications and the possible risk of falls with these medications.  Encouraged the use of regular Tylenol for her back pain at night.  She reports she takes the p.m. version.  Advised for her to continue to take her Paxil and possible adjustment and/or change of meds might be needed in the future if this  does not help her.  Please see #3   3. Generalized anxiety disorder Refuses therapy, reports she does not want to be told she is "crazy". Declines to continue buspirone.  Reports all the medications that she is currently taking are not very helpful for her.  Just recently got started back on Paxil. Might need an adjustment or change to another medication.  I advised that she is on several medications that can make her more dizzy and put her at higher risk for falls.  She is strongly encouraged to only take the medications that she needs at night.  And that there will be no other medication added at this time.  She verbalized understanding of that.  Explained to her the risk of adding more medications that would cause her to fall.  Has a lot of anxiety about possible neck surgery that she might need.  Reports that she wants to be told the prognosis and nobody will tell her prognosis.  I advised for her to keep her appointment with the surgeon as he is the one is been the best able to tell her what prognosis is because he someone is going to procedure she verbalized understanding of this.  Additionally requested help with getting [facility] services set up for her to make it to this appointment.    Perlie Mayo, NP  04/01/2019

## 2019-04-01 NOTE — Patient Instructions (Addendum)
Cheryl Abbott , Thank you for taking time to come for your Medicare Wellness Visit. I appreciate your ongoing commitment to your health goals. Please review the following plan we discussed and let me know if I can assist you in the future.   Please continue to practice social distancing to keep you, your family, and our community safe.  If you must go out, please wear a Mask and practice good handwashing.  Screening recommendations/referrals: Colonoscopy: Due 2020 Mammogram: Coming up  Bone Density: Up to date, continue calcium and Vitamin D  Recommended yearly ophthalmology/optometry visit for glaucoma screening and checkup Recommended yearly dental visit for hygiene and checkup  Vaccinations: Influenza vaccine: Completed Pneumococcal vaccine: Completed Tdap vaccine: Due 2023 Shingles vaccine: Completed    Advanced directives: Completed  Conditions/risks identified: Falls   Next appointment: 07/01/2019   Preventive Care 74 Years and Older, Female Preventive care refers to lifestyle choices and visits with your health care provider that can promote health and wellness. What does preventive care include?  A yearly physical exam. This is also called an annual well check.  Dental exams once or twice a year.  Routine eye exams. Ask your health care provider how often you should have your eyes checked.  Personal lifestyle choices, including:  Daily care of your teeth and gums.  Regular physical activity.  Eating a healthy diet.  Avoiding tobacco and drug use.  Limiting alcohol use.  Practicing safe sex.  Taking low-dose aspirin every day.  Taking vitamin and mineral supplements as recommended by your health care provider. What happens during an annual well check? The services and screenings done by your health care provider during your annual well check will depend on your age, overall health, lifestyle risk factors, and family history of disease. Counseling  Your  health care provider may ask you questions about your:  Alcohol use.  Tobacco use.  Drug use.  Emotional well-being.  Home and relationship well-being.  Sexual activity.  Eating habits.  History of falls.  Memory and ability to understand (cognition).  Work and work Statistician.  Reproductive health. Screening  You may have the following tests or measurements:  Height, weight, and BMI.  Blood pressure.  Lipid and cholesterol levels. These may be checked every 5 years, or more frequently if you are over 69 years old.  Skin check.  Lung cancer screening. You may have this screening every year starting at age 67 if you have a 30-pack-year history of smoking and currently smoke or have quit within the past 15 years.  Fecal occult blood test (FOBT) of the stool. You may have this test every year starting at age 80.  Flexible sigmoidoscopy or colonoscopy. You may have a sigmoidoscopy every 5 years or a colonoscopy every 10 years starting at age 55.  Hepatitis C blood test.  Hepatitis B blood test.  Sexually transmitted disease (STD) testing.  Diabetes screening. This is done by checking your blood sugar (glucose) after you have not eaten for a while (fasting). You may have this done every 1-3 years.  Bone density scan. This is done to screen for osteoporosis. You may have this done starting at age 68.  Mammogram. This may be done every 1-2 years. Talk to your health care provider about how often you should have regular mammograms. Talk with your health care provider about your test results, treatment options, and if necessary, the need for more tests. Vaccines  Your health care provider may recommend certain vaccines, such as:  Influenza vaccine. This is recommended every year.  Tetanus, diphtheria, and acellular pertussis (Tdap, Td) vaccine. You may need a Td booster every 10 years.  Zoster vaccine. You may need this after age 55.  Pneumococcal 13-valent  conjugate (PCV13) vaccine. One dose is recommended after age 73.  Pneumococcal polysaccharide (PPSV23) vaccine. One dose is recommended after age 11. Talk to your health care provider about which screenings and vaccines you need and how often you need them. This information is not intended to replace advice given to you by your health care provider. Make sure you discuss any questions you have with your health care provider. Document Released: 06/24/2015 Document Revised: 02/15/2016 Document Reviewed: 03/29/2015 Elsevier Interactive Patient Education  2017 [place] Prevention in the Home Falls can cause injuries. They can happen to people of all ages. There are many things you can do to make your home safe and to help prevent falls. What can I do on the outside of my home?  Regularly fix the edges of walkways and driveways and fix any cracks.  Remove anything that might make you trip as you walk through a door, such as a raised step or threshold.  Trim any bushes or trees on the path to your home.  Use bright outdoor lighting.  Clear any walking paths of anything that might make someone trip, such as rocks or tools.  Regularly check to see if handrails are loose or broken. Make sure that both sides of any steps have handrails.  Any raised decks and porches should have guardrails on the edges.  Have any leaves, snow, or ice cleared regularly.  Use sand or salt on walking paths during winter.  Clean up any spills in your garage right away. This includes oil or grease spills. What can I do in the bathroom?  Use night lights.  Install grab bars by the toilet and in the tub and shower. Do not use towel bars as grab bars.  Use non-skid mats or decals in the tub or shower.  If you need to sit down in the shower, use a plastic, non-slip stool.  Keep the floor dry. Clean up any water that spills on the floor as soon as it happens.  Remove soap buildup in the tub or  shower regularly.  Attach bath mats securely with double-sided non-slip rug tape.  Do not have throw rugs and other things on the floor that can make you trip. What can I do in the bedroom?  Use night lights.  Make sure that you have a light by your bed that is easy to reach.  Do not use any sheets or blankets that are too big for your bed. They should not hang down onto the floor.  Have a firm chair that has side arms. You can use this for support while you get dressed.  Do not have throw rugs and other things on the floor that can make you trip. What can I do in the kitchen?  Clean up any spills right away.  Avoid walking on wet floors.  Keep items that you use a lot in easy-to-reach places.  If you need to reach something above you, use a strong step stool that has a grab bar.  Keep electrical cords out of the way.  Do not use floor polish or wax that makes floors slippery. If you must use wax, use non-skid floor wax.  Do not have throw rugs and other things on the floor that  can make you trip. What can I do with my stairs?  Do not leave any items on the stairs.  Make sure that there are handrails on both sides of the stairs and use them. Fix handrails that are broken or loose. Make sure that handrails are as long as the stairways.  Check any carpeting to make sure that it is firmly attached to the stairs. Fix any carpet that is loose or worn.  Avoid having throw rugs at the top or bottom of the stairs. If you do have throw rugs, attach them to the floor with carpet tape.  Make sure that you have a light switch at the top of the stairs and the bottom of the stairs. If you do not have them, ask someone to add them for you. What else can I do to help prevent falls?  Wear shoes that:  Do not have high heels.  Have rubber bottoms.  Are comfortable and fit you well.  Are closed at the toe. Do not wear sandals.  If you use a stepladder:  Make sure that it is fully  opened. Do not climb a closed stepladder.  Make sure that both sides of the stepladder are locked into place.  Ask someone to hold it for you, if possible.  Clearly mark and make sure that you can see:  Any grab bars or handrails.  First and last steps.  Where the edge of each step is.  Use tools that help you move around (mobility aids) if they are needed. These include:  Canes.  Walkers.  Scooters.  Crutches.  Turn on the lights when you go into a dark area. Replace any light bulbs as soon as they burn out.  Set up your furniture so you have a clear path. Avoid moving your furniture around.  If any of your floors are uneven, fix them.  If there are any pets around you, be aware of where they are.  Review your medicines with your doctor. Some medicines can make you feel dizzy. This can increase your chance of falling. Ask your doctor what other things that you can do to help prevent falls. This information is not intended to replace advice given to you by your health care provider. Make sure you discuss any questions you have with your health care provider. Document Released: 03/24/2009 Document Revised: 11/03/2015 Document Reviewed: 07/02/2014 Elsevier Interactive Patient Education  2017 [employer].

## 2019-04-03 ENCOUNTER — Encounter (HOSPITAL_COMMUNITY): Payer: Self-pay | Admitting: Hematology

## 2019-04-03 ENCOUNTER — Other Ambulatory Visit: Payer: Self-pay | Admitting: Family Medicine

## 2019-04-03 ENCOUNTER — Inpatient Hospital Stay (HOSPITAL_BASED_OUTPATIENT_CLINIC_OR_DEPARTMENT_OTHER): Payer: Medicare Other | Admitting: Hematology

## 2019-04-03 DIAGNOSIS — C9000 Multiple myeloma not having achieved remission: Secondary | ICD-10-CM | POA: Diagnosis not present

## 2019-04-03 DIAGNOSIS — D472 Monoclonal gammopathy: Secondary | ICD-10-CM

## 2019-04-03 DIAGNOSIS — D649 Anemia, unspecified: Secondary | ICD-10-CM | POA: Diagnosis not present

## 2019-04-03 DIAGNOSIS — G629 Polyneuropathy, unspecified: Secondary | ICD-10-CM | POA: Diagnosis not present

## 2019-04-03 DIAGNOSIS — Z87891 Personal history of nicotine dependence: Secondary | ICD-10-CM | POA: Diagnosis not present

## 2019-04-03 DIAGNOSIS — E785 Hyperlipidemia, unspecified: Secondary | ICD-10-CM | POA: Diagnosis not present

## 2019-04-03 DIAGNOSIS — E119 Type 2 diabetes mellitus without complications: Secondary | ICD-10-CM | POA: Diagnosis not present

## 2019-04-03 DIAGNOSIS — M129 Arthropathy, unspecified: Secondary | ICD-10-CM | POA: Diagnosis not present

## 2019-04-03 DIAGNOSIS — I10 Essential (primary) hypertension: Secondary | ICD-10-CM | POA: Diagnosis not present

## 2019-04-03 DIAGNOSIS — K219 Gastro-esophageal reflux disease without esophagitis: Secondary | ICD-10-CM | POA: Diagnosis not present

## 2019-04-03 DIAGNOSIS — Z79899 Other long term (current) drug therapy: Secondary | ICD-10-CM | POA: Diagnosis not present

## 2019-04-03 DIAGNOSIS — Z86010 Personal history of colonic polyps: Secondary | ICD-10-CM | POA: Diagnosis not present

## 2019-04-03 NOTE — Assessment & Plan Note (Addendum)
1.  IgG kappa smoldering multiple myeloma: -Bone marrow biopsy on 02/19/2017 showed trilineage hematopoiesis, plasma cells of 14%.  Cytogenetics 53, XX.  FISH panel was normal. -Skeletal survey on 11/21/2018 did not show any focal lytic lesions. -Blood work dated 03/27/2019 shows M spike 1.3 g/dL.  LDH was normal.  Kappa light chains 62, up from 49 4 months ago.  Light chain ratio is 3.5, increased from 3.25.  CBC shows normal hemoglobin. -Creatinine is slightly worse at 1.8, up from 1.53.  Calcium is 10.  Albumin is 4.3. -She has 0 risk factors on the risk stratification model for SMM (07/31/18).  Based on this, her 2-year chance of progression to myeloma is 5%. -Hence I will switch her visits to every 6 months.  We will continue yearly skeletal survey.

## 2019-04-03 NOTE — Progress Notes (Signed)
[address] [place], [place] [address]   CLINIC:  Medical Oncology/Hematology  PCP:  [name], MD [address], [address] [place]  [address] [phone]   REASON FOR VISIT: Follow-up for Smoldering Multiple Myeloma   CURRENT THERAPY: observation   INTERVAL HISTORY:  Cheryl Abbott 73 y.o. female seen for follow-up of smoldering multiple myeloma.  Denies any bleeding per rectum or melena.  Appetite is 50%.  Energy levels are 75%.  No new onset bone pains.  Denies any fevers, night sweats or weight loss in the last 6 months.  Ankle swellings have been stable.  Occasional dizziness is present.  She has mild trouble swallowing pills.  Denies any recurrent infections.  Denies any ER visits or hospitalizations.   REVIEW OF SYSTEMS:  Review of Systems  Constitutional: Positive for fatigue.  HENT:   Positive for trouble swallowing.   Cardiovascular: Positive for leg swelling.  Gastrointestinal: Positive for nausea.  Psychiatric/Behavioral: The patient is nervous/anxious.   All other systems reviewed and are negative.    PAST MEDICAL/SURGICAL HISTORY:  Past Medical History:  Diagnosis Date  . Anemia   . Anxiety   . Anxiety disorder   . Arthritis   . Cancer ([place])    acute myeloma  . 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 Chronic back pain   . Depression   . Fatty liver   . GERD (gastroesophageal reflux disease)   . Headache   . History of adenomatous polyp of colon    2008  . History of kidney stones   . Hypercalcemia   . Hyperlipidemia   . Hypertension   . Left ureteral stone   . Lipoma of arm 01/19/2015  . Lumbar disc disease with radiculopathy   . Nephrolithiasis   . Neuropathy, peripheral   . OSA treated with BiPAP    per study 2007  . Sciatica   . Shoulder pain, bilateral   . Sigmoid diverticulosis   . Type 2 diabetes mellitus ([place])    Past Surgical History:  Procedure Laterality Date  . BIOPSY N/A 03/17/2013    Procedure: GASTRIC BIOPSIES;  Surgeon: Danie Binder, MD;  Location: [facility];  Service: Endoscopy;  Laterality: N/A;  . BIOPSY  06/10/2015   Procedure: BIOPSY;  Surgeon: Danie Binder, MD;  [facility];  Service: Endoscopy;;  gastric biopsy  . CARDIAC CATHETERIZATION  05-11-2003  dr Shelva Majestic ([facility])   Abnormal cardiolite/   Normal coronary arteries and normal LVF,  ef  63%  . CARDIOVASCULAR STRESS TEST  01-01-2014   normal lexiscan cardiolite/  no ischemia/ infarct/  normal LV function and wall motion ,  ef 81%  . COLONOSCOPY  last one 10/02/2011   [facility] [place] TICS, IH, NEXT TCS APR 2018  . COLONOSCOPY WITH PROPOFOL N/A 02/26/2017   Procedure: COLONOSCOPY WITH PROPOFOL;  Surgeon: Danie Binder, MD;  [facility];  Service: Endoscopy;  Laterality: N/A;  11:00am  . CYSTO/   RIGHT URETEROSOCOPY LASER LITHOTRIPSY STONE EXTRACTION  10-13-2004  . CYSTO/  RIGHT RETROGRADE PYELOGRAM/  PLACMENT RIGHT URETERAL STENT  01-10-2010  . CYSTOSCOPY W/ URETERAL STENT PLACEMENT Right 08/19/2015   Procedure: CYSTOSCOPY WITH RETROGRADE PYELOGRAM/URETERAL STENT PLACEMENT;  Surgeon: Franchot Gallo, MD;  Location: [facility];  Service: Urology;  Laterality: Right;  . CYSTOSCOPY WITH RETROGRADE PYELOGRAM, URETEROSCOPY AND STENT PLACEMENT Left 10/21/2014   Procedure: CYSTOSCOPY WITH RETROGRADE PYELOGRAM, URETEROSCOPY AND STENT PLACEMENT;  Surgeon: Franchot Gallo, MD;  Location: [facility];  Service: Urology;  Laterality: Left;  . CYSTOSCOPY WITH RETROGRADE PYELOGRAM, URETEROSCOPY AND STENT PLACEMENT Right 11/22/2015   Procedure: CYSTOSCOPY, RIGHT URETERAL STENT REMOVAL; RIGHT RETROGRADE PYELOGRAM, RIGHT URETEROSCOPY WITH BALLOON DILATION; RIGHT URETERAL STENT PLACEMENT;  Surgeon: Franchot Gallo, MD;  Location: [facility];  Service: Urology;  Laterality: Right;  . CYSTOSCOPY/RETROGRADE/URETEROSCOPY/STONE EXTRACTION WITH BASKET Bilateral 08/02/2015   Procedure:  CYSTOSCOPY; BILATERAL RETROGRADE PYELOGRAMS; BILATERAL URETEROSCOPY, STONE EXTRACTION WITH BASKET;  Surgeon: Franchot Gallo, MD;  Location: [facility];  Service: Urology;  Laterality: Bilateral;  . CYSTOSCOPY/RETROGRADE/URETEROSCOPY/STONE EXTRACTION WITH BASKET Right 06/28/2015   Procedure: CYSTOSCOPY/RETROGRADE/URETEROSCOPY/STONE EXTRACTION WITH BASKET, RIGHT URETERAL DOUBLE J STENT PLACEMENT;  Surgeon: Franchot Gallo, MD;  Location: [facility];  Service: Urology;  Laterality: Right;  . ESOPHAGOGASTRODUODENOSCOPY (EGD) WITH PROPOFOL N/A 03/17/2013   Procedure: ESOPHAGOGASTRODUODENOSCOPY (EGD) WITH PROPOFOL;  Surgeon: Danie Binder, MD;  Location: [facility];  Service: Endoscopy;  Laterality: N/A;  . ESOPHAGOGASTRODUODENOSCOPY (EGD) WITH PROPOFOL N/A 06/10/2015   Distal gastritis, distal esophageal stricture s/p dilation  . ESOPHAGOGASTRODUODENOSCOPY (EGD) WITH PROPOFOL N/A 02/26/2017   Procedure: ESOPHAGOGASTRODUODENOSCOPY (EGD) WITH PROPOFOL;  Surgeon: Danie Binder, MD;  [facility];  Service: Endoscopy;  Laterality: N/A;  . FLEXIBLE SIGMOIDOSCOPY  09/11/2011   VHQ:IONGEXBM Internal hemorrhoids  . HOLMIUM LASER APPLICATION Left 8/41/3244   Procedure: HOLMIUM LASER APPLICATION;  Surgeon: Franchot Gallo, MD;  Location[facility];  Service: Urology;  Laterality: Left;  . HOLMIUM LASER APPLICATION Right 0/03/2724   Procedure: HOLMIUM LASER APPLICATION;  Surgeon: Franchot Gallo, MD;  Location: [facility];  Service: Urology;  Laterality: Right;  . MASS EXCISION Left 03/04/2015   Procedure: EXCISION OF SOFT TISSUE NEOPLASM LEFT ARM;  Surgeon: Aviva Signs, MD;  Location: [facility];  Service: General;  Laterality: Left;  . PERCUTANEOUS NEPHROSTOLITHOTOMY Bilateral 12/  2011     [facility]  . POLYPECTOMY N/A 03/17/2013   Procedure: GASTRIC POLYPECTOMY;  Surgeon: Danie Binder, MD;  Location: [facility];  Service: Endoscopy;  Laterality: N/A;  . POLYPECTOMY  02/26/2017   Procedure:  POLYPECTOMY;  Surgeon: Danie Binder, MD;  [facility];  Service: Endoscopy;;  polyp at cecum, ascending colon polyps x3, hepatic flexure polyps x8, transverse colon polyps x8   . REMOVAL RIGHT THIGH CYST  2006  . SAVORY DILATION N/A 03/17/2013   Procedure: SAVORY DILATION;  Surgeon: Danie Binder, MD;  Location: [facility];  Service: Endoscopy;  Laterality: N/A;  #12.8, 14, 15, 16 dilators used  . SAVORY DILATION N/A 06/10/2015   Procedure: SAVORY DILATION;  Surgeon: Danie Binder, MD;  [facility];  Service: Endoscopy;  Laterality: N/A;  . TRANSTHORACIC ECHOCARDIOGRAM  01-01-2014   mild LVH/  ef 36-64%/  grade I diastolic dysfunction/  trivial MR, TR, and PR  . VAGINAL HYSTERECTOMY  1970's     SOCIAL HISTORY:  Social History   Socioeconomic History  . Marital status: Divorced    Spouse name: Not on file  . Number of children: 2  . Years of education: Not on file  . Highest education level: Not on file  Occupational History  . Occupation: retired   Scientific laboratory technician  . Financial resource strain: Not very hard  . Food insecurity    Worry: Never true    Inability: Never true  . Transportation needs    Medical: No    Non-medical: No  Tobacco Use  . Smoking status:  Former Smoker    Packs/day: 1.00    Years: 20.00    Pack years: 20.00    Types: Cigarettes    Start date: 09/21/1974    Quit date: 09/20/1988    Years since quitting: 30.5  . Smokeless tobacco: Never Used  . Tobacco comment: Quit x 20 years  Substance and Sexual Activity  . Alcohol use: No    Alcohol/week: 0.0 standard drinks  . Drug use: No  . Sexual activity: Never    Birth control/protection: Surgical  Lifestyle  . Physical activity    Days per week: 0 days    Minutes per session: 0 min  . Stress: Only a little  Relationships  . Social Herbalist on phone: Not on file    Gets together: Not on file    Attends religious service: Not on file    Active member of club or  organization: Not on file    Attends meetings of clubs or organizations: Not on file    Relationship status: Divorced  . Intimate partner violence    Fear of current or ex partner: No    Emotionally abused: No    Physically abused: No    Forced sexual activity: No  Other Topics Concern  . Not on file  Social History Narrative   Patient is right handed   Patient drinks some caffeine daily.    FAMILY HISTORY:  Family History  Problem Relation Age of Onset  . Hypertension Mother   . Diabetes Mother   . Heart failure Mother   . Dementia Mother   . Emphysema Father   . Hypertension Father   . Diabetes Brother   . GER disease Brother   . Hypertension Sister   . Hypertension Sister   . Cancer Other        family history   . Diabetes Other        family history   . Heart defect Other        famiily history   . Arthritis Other        family history   . Anesthesia problems Neg Hx   . Hypotension Neg Hx   . Malignant hyperthermia Neg Hx   . Pseudochol deficiency Neg Hx   . Colon cancer Neg Hx     CURRENT MEDICATIONS:  Outpatient Encounter Medications as of 04/03/2019  Medication Sig  . ALPRAZolam (XANAX) 0.5 MG tablet Take one tablet at bedtime, as needed, for uncontrolled anxiety  . amLODipine (NORVASC) 10 MG tablet TAKE 1 TABLET BY MOUTH EVERY DAY  . aspirin EC 325 MG tablet Take 325 mg by mouth daily.  . budesonide-formoterol (SYMBICORT) 80-4.5 MCG/ACT inhaler INHALE 2 PUFFS INTO THE LUNGS TWICE A DAY  . calcium-vitamin D (OSCAL WITH D) 500-200 MG-UNIT tablet Take 1 tablet by mouth daily.  . diclofenac sodium (VOLTAREN) 1 % GEL Apply 4 g topically 4 (four) times daily.  Marland Kitchen lovastatin (MEVACOR) 40 MG tablet TAKE 1 TABLET AT BEDTIME  . Melatonin 10 MG TABS Take 10 mg by mouth at bedtime.  . pantoprazole (PROTONIX) 40 MG tablet TAKE 1 TABLET (40 MG TOTAL) BY MOUTH 2 (TWO) TIMES DAILY.  Marland Kitchen PARoxetine (PAXIL) 40 MG tablet TAKE 1 TABLET BY MOUTH EVERY DAY IN THE MORNING  .  potassium chloride (K-DUR) 10 MEQ tablet Take 1 tablet (10 mEq total) by mouth daily.  . TRADJENTA 5 MG TABS tablet TAKE 1 TABLET BY MOUTH EVERY DAY  . traZODone (  DESYREL) 100 MG tablet Take 100 mg by mouth at bedtime.  . furosemide (LASIX) 40 MG tablet Take 1 tablet (40 mg total) by mouth daily as needed for fluid. (Patient not taking: Reported on 04/03/2019)  . TOBRADEX ophthalmic ointment Place 1 application into the right eye 2 (two) times daily.  . [DISCONTINUED] traZODone (DESYREL) 100 MG tablet Take 1 tablet (100 mg total) by mouth at bedtime.   No facility-administered encounter medications on file as of 04/03/2019.     ALLERGIES:  Allergies  Allergen Reactions  . Ace Inhibitors Cough  . Keflex [Cephalexin] Diarrhea and Nausea And Vomiting  . Nitrofurantoin Diarrhea and Nausea And Vomiting  . Penicillins Hives and Other (See Comments)    Reaction:  Blisters on hands/feet  Has patient had a PCN reaction causing immediate rash, facial/tongue/throat swelling, SOB or lightheadedness with hypotension: No Has patient had a PCN reaction causing severe rash involving mucus membranes or skin necrosis: No Has patient had a PCN reaction that required hospitalization No Has patient had a PCN reaction occurring within the last 10 years: No If all of the above answers are "NO", then may proceed with Cephalosporin use.     PHYSICAL EXAM:  ECOG Performance status: 1  Vitals:   04/03/19 1017  BP: 110/67  Pulse: (!) 117  Resp: 18  Temp: 97.7 F (36.5 C)  SpO2: 95%   Filed Weights   04/03/19 1017  Weight: 198 lb 8 oz (90 kg)    Physical Exam Constitutional:      Appearance: Normal appearance. She is normal weight.  Cardiovascular:     Rate and Rhythm: Normal rate and regular rhythm.     Heart sounds: Normal heart sounds.  Pulmonary:     Effort: Pulmonary effort is normal.     Breath sounds: Normal breath sounds.  Musculoskeletal: Normal range of motion.  Skin:    General:  Skin is warm and dry.  Neurological:     Mental Status: She is alert and oriented to person, place, and time. Mental status is at baseline.  Psychiatric:        Mood and Affect: Mood normal.        Behavior: Behavior normal.        Thought Content: Thought content normal.        Judgment: Judgment normal.      LABORATORY DATA:  I have reviewed the labs as listed.  CBC    Component Value Date/Time   WBC 7.9 03/27/2019 1048   RBC 4.31 03/27/2019 1048   HGB 13.1 03/27/2019 1048   HCT 41.8 03/27/2019 1048   PLT 315 03/27/2019 1048   MCV 97.0 03/27/2019 1048   MCH 30.4 03/27/2019 1048   MCHC 31.3 03/27/2019 1048   RDW 14.6 03/27/2019 1048   LYMPHSABS 1.6 03/27/2019 1048   MONOABS 0.7 03/27/2019 1048   EOSABS 0.1 03/27/2019 1048   BASOSABS 0.0 03/27/2019 1048   CMP Latest Ref Rng & Units 03/27/2019 02/06/2019 01/09/2019  Glucose 70 - 99 mg/dL 102(H) 110(H) 227(H)  BUN 8 - 23 mg/dL 19 18 9   Creatinine 0.44 - 1.00 mg/dL 1.81(H) 1.53(H) 1.49(H)  Sodium 135 - 145 mmol/L 132(L) 144 136  Potassium 3.5 - 5.1 mmol/L 4.6 3.9 4.0  Chloride 98 - 111 mmol/L 99 107 104  CO2 22 - 32 mmol/L 23 24 23   Calcium 8.9 - 10.3 mg/dL 10.0 10.8(H) 9.6  Total Protein 6.5 - 8.1 g/dL 9.4(H) 9.7(H) 7.6  Total Bilirubin 0.3 -  1.2 mg/dL 0.3 0.3 0.2  Alkaline Phos 38 - 126 U/L 74 70 -  AST 15 - 41 U/L 21 27 17   ALT 0 - 44 U/L 20 19 13        DIAGNOSTIC IMAGING:  I have independently reviewed the scans and discussed with the patient.    ASSESSMENT & PLAN:   Smoldering myeloma ([facility]) 1.  IgG kappa smoldering multiple myeloma: -Bone marrow biopsy on 02/19/2017 showed trilineage hematopoiesis, plasma cells of 14%.  Cytogenetics 61, XX.  FISH panel was normal. -Skeletal survey on 11/21/2018 did not show any focal lytic lesions. -Blood work dated 03/27/2019 shows M spike 1.3 g/dL.  LDH was normal.  Kappa light chains 62, up from 49 4 months ago.  Light chain ratio is 3.5, increased from 3.25.  CBC shows  normal hemoglobin. -Creatinine is slightly worse at 1.8, up from 1.53.  Calcium is 10.  Albumin is 4.3. -She has 0 risk factors on the risk stratification model for SMM (07/31/18).  Based on this, her 2-year chance of progression to myeloma is 5%. -Hence I will switch her visits to every 6 months.  We will continue yearly skeletal survey.      Orders placed this encounter:  Orders Placed This Encounter  Procedures  . CBC with Differential/Platelet  . Comprehensive metabolic panel  . Protein electrophoresis, serum  . Kappa/lambda light chains  . Lactate dehydrogenase      Derek Jack, MD [place] [phone]

## 2019-04-03 NOTE — Patient Instructions (Signed)
[facility] at [facility] Discharge Instructions  You were seen today by Dr. Katragadda. He went over your recent lab results. He will see you back in 6 months for labs and follow up.   Thank you for choosing [facility] at [facility] to provide your oncology and hematology care.  To afford each patient quality time with our provider, please arrive at least 15 minutes before your scheduled appointment time.   If you have a lab appointment with the Cancer Center please come in thru the  Main Entrance and check in at the main information desk  You need to re-schedule your appointment should you arrive 10 or more minutes late.  We strive to give you quality time with our providers, and arriving late affects you and other patients whose appointments are after yours.  Also, if you no show three or more times for appointments you may be dismissed from the clinic at the providers discretion.     Again, thank you for choosing [facility].  Our hope is that these requests will decrease the amount of time that you wait before being seen by our physicians.       _____________________________________________________________  Should you have questions after your visit to [facility], please contact our office at [phone] between the hours of 8:00 a.m. and 4:30 p.m.  Voicemails left after 4:00 p.m. will not be returned until the following business day.  For prescription refill requests, have your pharmacy contact our office and allow 72 hours.    Cancer Center Support Programs:   > Cancer Support Group  2nd Tuesday of the month 1pm-2pm, Journey Room    

## 2019-04-09 NOTE — Telephone Encounter (Signed)
Called patient and left message for them to return call at the office   

## 2019-04-20 DIAGNOSIS — R0902 Hypoxemia: Secondary | ICD-10-CM | POA: Diagnosis not present

## 2019-05-11 ENCOUNTER — Ambulatory Visit (HOSPITAL_COMMUNITY)
Admission: RE | Admit: 2019-05-11 | Discharge: 2019-05-11 | Disposition: A | Discharge status: Home / Self Care | Payer: Medicare Other | Source: Ambulatory Visit | Attending: Family Medicine | Admitting: Family Medicine

## 2019-05-11 ENCOUNTER — Other Ambulatory Visit: Payer: Self-pay

## 2019-05-11 DIAGNOSIS — Z1231 Encounter for screening mammogram for malignant neoplasm of breast: Secondary | ICD-10-CM | POA: Diagnosis not present

## 2019-05-18 ENCOUNTER — Ambulatory Visit
Admission: EM | Admit: 2019-05-18 | Discharge: 2019-05-18 | Disposition: A | Discharge status: Home / Self Care | Payer: Medicare Other | Attending: Emergency Medicine | Admitting: Emergency Medicine

## 2019-05-18 ENCOUNTER — Other Ambulatory Visit: Payer: Self-pay

## 2019-05-18 DIAGNOSIS — M25512 Pain in left shoulder: Secondary | ICD-10-CM

## 2019-05-18 DIAGNOSIS — G8929 Other chronic pain: Secondary | ICD-10-CM | POA: Diagnosis not present

## 2019-05-18 MED ORDER — TIZANIDINE HCL 2 MG PO CAPS: 2.0000 mg | ORAL_CAPSULE | Freq: Every evening | ORAL | 0 refills | Status: DC | PRN

## 2019-05-18 MED ORDER — KETOROLAC TROMETHAMINE 30 MG/ML IJ SOLN
30.0000 mg | Freq: Once | INTRAMUSCULAR | Status: AC
  Administered 2019-05-18: 30 mg via INTRAMUSCULAR

## 2019-05-18 NOTE — Discharge Instructions (Addendum)
Continue conservative management of rest, ice, ice and elevation Toradol given in office Take tizandine at nighttime for symptomatic relief. Avoid driving or operating heavy machinery while using medication. Follow up with PCP  Go to the ER if you have any new or worsening symptoms (fever, chills, chest pain, swelling, redness, bruising, worsening symptoms despite medications, etc...)

## 2019-05-18 NOTE — ED Triage Notes (Signed)
Pt presents to UC c/o left shoulder pain since 4 days. Pt states she has "dead nerves" in her left shoulder area that she sees a neurologist for. Pt states when she would like something to help w/ her pain "so she could sleep/"

## 2019-05-18 NOTE — ED Provider Notes (Signed)
[name]   [id number] 05/18/19 Arrival Time: 3354  CC: Left shoulder pain  SUBJECTIVE: History from: patient. Cheryl Abbott is a 73 y.o. female complains of left shoulder pain x 1 month.  Denies a precipitating event or specific injury. States the nerves are "dead back there."  Localizes the pain to the left posterior shoulder.  Describes the pain as constant and "pins and needles" in character.  Has tried OTC medications without relief.  Denies aggravating factors. Complains of associated swelling.  Denies fever, chills, erythema, ecchymosis, weakness.  ROS: As per HPI.  All other pertinent ROS negative.     Past Medical History:  Diagnosis Date  . Anemia   . Anxiety   . Anxiety disorder   . Arthritis   . Cancer ([place])    acute myeloma  . 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 Chronic back pain   . Depression   . Fatty liver   . GERD (gastroesophageal reflux disease)   . Headache   . History of adenomatous polyp of colon    2008  . History of kidney stones   . Hypercalcemia   . Hyperlipidemia   . Hypertension   . Left ureteral stone   . Lipoma of arm 01/19/2015  . Lumbar disc disease with radiculopathy   . Nephrolithiasis   . Neuropathy, peripheral   . OSA treated with BiPAP    per study 2007  . Sciatica   . Shoulder pain, bilateral   . Sigmoid diverticulosis   . Type 2 diabetes mellitus ([place])    Past Surgical History:  Procedure Laterality Date  . BIOPSY N/A 03/17/2013   Procedure: GASTRIC BIOPSIES;  Surgeon: Danie Binder, MD;  Location: [facility];  Service: Endoscopy;  Laterality: N/A;  . BIOPSY  06/10/2015   Procedure: BIOPSY;  Surgeon: Danie Binder, MD;  [facility];  Service: Endoscopy;;  gastric biopsy  . CARDIAC CATHETERIZATION  05-11-2003  dr Shelva Majestic ([facility])   Abnormal cardiolite/   Normal coronary arteries and normal LVF,  ef  63%  . CARDIOVASCULAR STRESS TEST  01-01-2014   normal lexiscan  cardiolite/  no ischemia/ infarct/  normal LV function and wall motion ,  ef 81%  . COLONOSCOPY  last one 10/02/2011   MOD [place] TICS, IH, NEXT TCS APR 2018  . COLONOSCOPY WITH PROPOFOL N/A 02/26/2017   Procedure: COLONOSCOPY WITH PROPOFOL;  Surgeon: Danie Binder, MD;  [facility];  Service: Endoscopy;  Laterality: N/A;  11:00am  . CYSTO/   RIGHT URETEROSOCOPY LASER LITHOTRIPSY STONE EXTRACTION  10-13-2004  . CYSTO/  RIGHT RETROGRADE PYELOGRAM/  PLACMENT RIGHT URETERAL STENT  01-10-2010  . CYSTOSCOPY W/ URETERAL STENT PLACEMENT Right 08/19/2015   Procedure: CYSTOSCOPY WITH RETROGRADE PYELOGRAM/URETERAL STENT PLACEMENT;  Surgeon: Franchot Gallo, MD;  Location: [facility];  Service: Urology;  Laterality: Right;  . CYSTOSCOPY WITH RETROGRADE PYELOGRAM, URETEROSCOPY AND STENT PLACEMENT Left 10/21/2014   Procedure: CYSTOSCOPY WITH RETROGRADE PYELOGRAM, URETEROSCOPY AND STENT PLACEMENT;  Surgeon: Franchot Gallo, MD;  Location[facility];  Service: Urology;  Laterality: Left;  . CYSTOSCOPY WITH RETROGRADE PYELOGRAM, URETEROSCOPY AND STENT PLACEMENT Right 11/22/2015   Procedure: CYSTOSCOPY, RIGHT URETERAL STENT REMOVAL; RIGHT RETROGRADE PYELOGRAM, RIGHT URETEROSCOPY WITH BALLOON DILATION; RIGHT URETERAL STENT PLACEMENT;  Surgeon: Franchot Gallo, MD;  Location: [facility];  Service: Urology;  Laterality: Right;  . CYSTOSCOPY/RETROGRADE/URETEROSCOPY/STONE EXTRACTION WITH BASKET Bilateral 08/02/2015   Procedure: CYSTOSCOPY; BILATERAL RETROGRADE PYELOGRAMS; BILATERAL URETEROSCOPY, STONE  EXTRACTION WITH BASKET;  Surgeon: Franchot Gallo, MD;  Location: [facility];  Service: Urology;  Laterality: Bilateral;  . CYSTOSCOPY/RETROGRADE/URETEROSCOPY/STONE EXTRACTION WITH BASKET Right 06/28/2015   Procedure: CYSTOSCOPY/RETROGRADE/URETEROSCOPY/STONE EXTRACTION WITH BASKET, RIGHT URETERAL DOUBLE J STENT PLACEMENT;  Surgeon: Franchot Gallo, MD;  Location: [facility];  Service: Urology;  Laterality:  Right;  . ESOPHAGOGASTRODUODENOSCOPY (EGD) WITH PROPOFOL N/A 03/17/2013   Procedure: ESOPHAGOGASTRODUODENOSCOPY (EGD) WITH PROPOFOL;  Surgeon: Danie Binder, MD;  Location: [facility];  Service: Endoscopy;  Laterality: N/A;  . ESOPHAGOGASTRODUODENOSCOPY (EGD) WITH PROPOFOL N/A 06/10/2015   Distal gastritis, distal esophageal stricture s/p dilation  . ESOPHAGOGASTRODUODENOSCOPY (EGD) WITH PROPOFOL N/A 02/26/2017   Procedure: ESOPHAGOGASTRODUODENOSCOPY (EGD) WITH PROPOFOL;  Surgeon: Danie Binder, MD;  [facility];  Service: Endoscopy;  Laterality: N/A;  . FLEXIBLE SIGMOIDOSCOPY  09/11/2011   [id number] Internal hemorrhoids  . HOLMIUM LASER APPLICATION Left 07/31/2540   Procedure: HOLMIUM LASER APPLICATION;  Surgeon: Franchot Gallo, MD;  Location: [facility];  Service: Urology;  Laterality: Left;  . HOLMIUM LASER APPLICATION Right 12/15/2374   Procedure: HOLMIUM LASER APPLICATION;  Surgeon: Franchot Gallo, MD;  Location: [facility];  Service: Urology;  Laterality: Right;  . MASS EXCISION Left 03/04/2015   Procedure: EXCISION OF SOFT TISSUE NEOPLASM LEFT ARM;  Surgeon: Aviva Signs, MD;  Location: [facility];  Service: General;  Laterality: Left;  . PERCUTANEOUS NEPHROSTOLITHOTOMY Bilateral 12/  2011     [facility]  . POLYPECTOMY N/A 03/17/2013   Procedure: GASTRIC POLYPECTOMY;  Surgeon: Danie Binder, MD;  Location: [facility];  Service: Endoscopy;  Laterality: N/A;  . POLYPECTOMY  02/26/2017   Procedure: POLYPECTOMY;  Surgeon: Danie Binder, MD;  [facility];  Service: Endoscopy;;  polyp at cecum, ascending colon polyps x3, hepatic flexure polyps x8, transverse colon polyps x8   . REMOVAL RIGHT THIGH CYST  2006  . SAVORY DILATION N/A 03/17/2013   Procedure: SAVORY DILATION;  Surgeon: Danie Binder, MD;  Location: [facility];  Service: Endoscopy;  Laterality: N/A;  #12.8, 14, 15, 16 dilators used  . SAVORY DILATION N/A 06/10/2015   Procedure: SAVORY DILATION;  Surgeon:  Danie Binder, MD;  [facility];  Service: Endoscopy;  Laterality: N/A;  . TRANSTHORACIC ECHOCARDIOGRAM  01-01-2014   mild LVH/  ef 28-31%/  grade I diastolic dysfunction/  trivial MR, TR, and PR  . VAGINAL HYSTERECTOMY  1970's   Allergies  Allergen Reactions  . Ace Inhibitors Cough  . Keflex [Cephalexin] Diarrhea and Nausea And Vomiting  . Nitrofurantoin Diarrhea and Nausea And Vomiting  . Penicillins Hives and Other (See Comments)    Reaction:  Blisters on hands/feet  Has patient had a PCN reaction causing immediate rash, facial/tongue/throat swelling, SOB or lightheadedness with hypotension: No Has patient had a PCN reaction causing severe rash involving mucus membranes or skin necrosis: No Has patient had a PCN reaction that required hospitalization No Has patient had a PCN reaction occurring within the last 10 years: No If all of the above answers are "NO", then may proceed with Cephalosporin use.   No current facility-administered medications on file prior to encounter.    Current Outpatient Medications on File Prior to Encounter  Medication Sig Dispense Refill  . ALPRAZolam (XANAX) 0.5 MG tablet Take one tablet at bedtime, as needed, for uncontrolled anxiety 15 tablet 0  . amLODipine (NORVASC) 10 MG tablet TAKE 1 TABLET BY MOUTH EVERY DAY 90 tablet 1  . aspirin EC 325 MG  tablet Take 325 mg by mouth daily.    . budesonide-formoterol (SYMBICORT) 80-4.5 MCG/ACT inhaler INHALE 2 PUFFS INTO THE LUNGS TWICE A DAY 30.6 Inhaler 3  . calcium-vitamin D (OSCAL WITH D) 500-200 MG-UNIT tablet Take 1 tablet by mouth daily.    . diclofenac sodium (VOLTAREN) 1 % GEL Apply 4 g topically 4 (four) times daily. 5 g 3  . furosemide (LASIX) 40 MG tablet Take 1 tablet (40 mg total) by mouth daily as needed for fluid. (Patient not taking: Reported on 04/03/2019)    . lovastatin (MEVACOR) 40 MG tablet TAKE 1 TABLET AT BEDTIME 90 tablet 1  . Melatonin 10 MG TABS Take 10 mg by mouth at  bedtime.    . pantoprazole (PROTONIX) 40 MG tablet TAKE 1 TABLET (40 MG TOTAL) BY MOUTH 2 (TWO) TIMES DAILY. 180 tablet 1  . PARoxetine (PAXIL) 40 MG tablet TAKE 1 TABLET BY MOUTH EVERY DAY IN THE MORNING 90 tablet 0  . potassium chloride (K-DUR) 10 MEQ tablet Take 1 tablet (10 mEq total) by mouth daily. 3 tablet 0  . TOBRADEX ophthalmic ointment Place 1 application into the right eye 2 (two) times daily.    . TRADJENTA 5 MG TABS tablet TAKE 1 TABLET BY MOUTH EVERY DAY 90 tablet 1  . traZODone (DESYREL) 100 MG tablet Take 100 mg by mouth at bedtime.     Social History   Socioeconomic History  . Marital status: Divorced    Spouse name: Not on file  . Number of children: 2  . Years of education: Not on file  . Highest education level: Not on file  Occupational History  . Occupation: retired   Scientific laboratory technician  . Financial resource strain: Not very hard  . Food insecurity    Worry: Never true    Inability: Never true  . Transportation needs    Medical: No    Non-medical: No  Tobacco Use  . Smoking status: Former Smoker    Packs/day: 1.00    Years: 20.00    Pack years: 20.00    Types: Cigarettes    Start date: 09/21/1974    Quit date: 09/20/1988    Years since quitting: 30.6  . Smokeless tobacco: Never Used  . Tobacco comment: Quit x 20 years  Substance and Sexual Activity  . Alcohol use: No    Alcohol/week: 0.0 standard drinks  . Drug use: No  . Sexual activity: Never    Birth control/protection: Surgical  Lifestyle  . Physical activity    Days per week: 0 days    Minutes per session: 0 min  . Stress: Only a little  Relationships  . Social Herbalist on phone: Not on file    Gets together: Not on file    Attends religious service: Not on file    Active member of club or organization: Not on file    Attends meetings of clubs or organizations: Not on file    Relationship status: Divorced  . Intimate partner violence    Fear of current or ex partner: No     Emotionally abused: No    Physically abused: No    Forced sexual activity: No  Other Topics Concern  . Not on file  Social History Narrative   Patient is right handed   Patient drinks some caffeine daily.   Family History  Problem Relation Age of Onset  . Hypertension Mother   . Diabetes Mother   . Heart failure  Mother   . Dementia Mother   . Emphysema Father   . Hypertension Father   . Diabetes Brother   . GER disease Brother   . Hypertension Sister   . Hypertension Sister   . Cancer Other        family history   . Diabetes Other        family history   . Heart defect Other        famiily history   . Arthritis Other        family history   . Anesthesia problems Neg Hx   . Hypotension Neg Hx   . Malignant hyperthermia Neg Hx   . Pseudochol deficiency Neg Hx   . Colon cancer Neg Hx     OBJECTIVE:  Vitals:   05/18/19 1349  BP: 137/78  Pulse: (!) 108  Resp: 16  Temp: 98.7 F (37.1 C)  TempSrc: Oral  SpO2: 94%    General appearance: ALERT; in no acute distress.  Head: NCAT Lungs: Normal respiratory effort CV: RRR Musculoskeletal: Left shoulder Inspection: Skin warm, dry, clear and intact without obvious erythema, effusion, or ecchymosis.  Palpation: diffusely TTP over posterior shoulder; difficult to assess, patient pushes my hand away during exam ROM: FROM active and passive Strength: 5/5 shld abduction, 5/5 shld adduction, 5/5 elbow flexion, 5/5 elbow extension, 5/5 grip strength Skin: warm and dry Neurologic: sitting in wheelchair; Sensation intact about the upper extremities Psychological: alert and cooperative; abnormal mood and affect; fidgeting in chair  ASSESSMENT & PLAN:  1. Chronic left shoulder pain     Meds ordered this encounter  Medications  . tizanidine (ZANAFLEX) 2 MG capsule    Sig: Take 1 capsule (2 mg total) by mouth at bedtime as needed for muscle spasms.    Dispense:  15 capsule    Refill:  0    Order Specific Question:    Supervising Provider    Answer:   Raylene Everts [[id number]]  . ketorolac (TORADOL) 30 MG/ML injection 30 mg   Continue conservative management of rest, ice, ice and elevation Toradol given in office Take tizandine at nighttime for symptomatic relief. Avoid driving or operating heavy machinery while using medication. Follow up with PCP  Go to the ER if you have any new or worsening symptoms (fever, chills, chest pain, swelling, redness, bruising, worsening symptoms despite medications, etc...)   Reviewed expectations re: course of current medical issues. Questions answered. Outlined signs and symptoms indicating need for more acute intervention. Patient verbalized understanding. After Visit Summary given.    Lestine Box, PA-C 05/18/19 2041

## 2019-05-20 DIAGNOSIS — R0902 Hypoxemia: Secondary | ICD-10-CM | POA: Diagnosis not present

## 2019-05-29 ENCOUNTER — Other Ambulatory Visit: Payer: Self-pay | Admitting: Cardiology

## 2019-06-17 DIAGNOSIS — M542 Cervicalgia: Secondary | ICD-10-CM | POA: Diagnosis not present

## 2019-06-17 DIAGNOSIS — M5021 Other cervical disc displacement,  high cervical region: Secondary | ICD-10-CM | POA: Diagnosis not present

## 2019-06-17 DIAGNOSIS — M5412 Radiculopathy, cervical region: Secondary | ICD-10-CM | POA: Diagnosis not present

## 2019-06-17 DIAGNOSIS — M47812 Spondylosis without myelopathy or radiculopathy, cervical region: Secondary | ICD-10-CM | POA: Diagnosis not present

## 2019-06-17 DIAGNOSIS — I10 Essential (primary) hypertension: Secondary | ICD-10-CM | POA: Diagnosis not present

## 2019-06-20 DIAGNOSIS — R0902 Hypoxemia: Secondary | ICD-10-CM | POA: Diagnosis not present

## 2019-07-01 ENCOUNTER — Other Ambulatory Visit: Payer: Self-pay

## 2019-07-01 ENCOUNTER — Ambulatory Visit (INDEPENDENT_AMBULATORY_CARE_PROVIDER_SITE_OTHER): Payer: Medicare Other | Admitting: Family Medicine

## 2019-07-01 VITALS — BP 137/78 | Ht 66.5 in | Wt 198.0 lb

## 2019-07-01 DIAGNOSIS — E669 Obesity, unspecified: Secondary | ICD-10-CM

## 2019-07-01 DIAGNOSIS — E1121 Type 2 diabetes mellitus with diabetic nephropathy: Secondary | ICD-10-CM

## 2019-07-01 DIAGNOSIS — I10 Essential (primary) hypertension: Secondary | ICD-10-CM

## 2019-07-01 DIAGNOSIS — F324 Major depressive disorder, single episode, in partial remission: Secondary | ICD-10-CM

## 2019-07-01 DIAGNOSIS — E66811 Obesity, class 1: Secondary | ICD-10-CM

## 2019-07-01 DIAGNOSIS — K120 Recurrent oral aphthae: Secondary | ICD-10-CM

## 2019-07-01 DIAGNOSIS — R252 Cramp and spasm: Secondary | ICD-10-CM

## 2019-07-01 NOTE — Progress Notes (Signed)
Virtual Visit via Telephone Note   This visit type was conducted due to national recommendations for restrictions regarding the COVID-19 Pandemic (e.g. social distancing) in an effort to limit this patient's exposure and mitigate transmission in our community.  Due to her co-morbid illnesses, this patient is at least at moderate risk for complications without adequate follow up.  This format is felt to be most appropriate for this patient at this time.  The patient did not have access to video technology/had technical difficulties with video requiring transitioning to audio format only (telephone).  All issues noted in this document were discussed and addressed.  No physical exam could be performed with this format.    Evaluation Performed:  Follow-up visit  Date:  07/05/2019   ID:  Cheryl Abbott, DOB [birth year], MRN [id number]  Patient Location: Home Provider Location: Office  Location of Patient: Home Location of Provider: Telehealth Consent was obtain for visit to be over via telehealth. I verified that I am speaking with the correct person using two identifiers.  PCP:  [name], MD   Chief Complaint:  Follow up, mouth sore  History of Present Illness:    Cheryl Abbott is a 74 y.o. female with history of hypertension, TIA, sleep apnea, GERD, type 2 diabetes among others.  Presents today for follow-up in addition to reporting that she has a mouth sore and abdominal pain.  Mouth sore: She reports that she has a blister in her mouth is sore.  She reports that it is along her gumline.  It hurts when she eats.  When something touches it.  Or when she brushes her teeth.  She denies having any white patches.  Any signs or symptoms of having infection recently.  Reports that Chloraseptic helps some and lozenges did to but they were short lived.  She reports taking all her medications as directed.  And without issue.  The patient does not have symptoms concerning for  COVID-19 infection (fever, chills, cough, or new shortness of breath).   Past Medical, Surgical, Social History, Allergies, and Medications have been Reviewed.  Past Medical History:  Diagnosis Date  . Anemia   . Anxiety   . Anxiety disorder   . Arthritis   . Cancer ([place])    acute myeloma  . 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 Chronic back pain   . Depression   . Fatty liver   . GERD (gastroesophageal reflux disease)   . Headache   . History of adenomatous polyp of colon    2008  . History of kidney stones   . Hypercalcemia   . Hyperlipidemia   . Hypertension   . Left ureteral stone   . Lipoma of arm 01/19/2015  . Lumbar disc disease with radiculopathy   . Nephrolithiasis   . Neuropathy, peripheral   . OSA treated with BiPAP    per study 2007  . Sciatica   . Shoulder pain, bilateral   . Sigmoid diverticulosis   . Type 2 diabetes mellitus ([place])    Past Surgical History:  Procedure Laterality Date  . BIOPSY N/A 03/17/2013   Procedure: GASTRIC BIOPSIES;  Surgeon: Danie Binder, MD;  Location: [facility];  Service: Endoscopy;  Laterality: N/A;  . BIOPSY  06/10/2015   Procedure: BIOPSY;  Surgeon: Danie Binder, MD;  [facility];  Service: Endoscopy;;  gastric biopsy  . CARDIAC CATHETERIZATION  05-11-2003  dr Shelva Majestic ([facility])   Abnormal  cardiolite/   Normal coronary arteries and normal LVF,  ef  63%  . CARDIOVASCULAR STRESS TEST  01-01-2014   normal lexiscan cardiolite/  no ischemia/ infarct/  normal LV function and wall motion ,  ef 81%  . COLONOSCOPY  last one 10/02/2011   [facility] [place] T[facility], IH, NEXT TCS APR 2018  . COLONOSCOPY WITH PROPOFOL N/A 02/26/2017   Procedure: COLONOSCOPY WITH PROPOFOL;  Surgeon: Danie Binder, MD;  [facility];  Service: Endoscopy;  Laterality: N/A;  11:00am  . CYSTO/   RIGHT URETEROSOCOPY LASER LITHOTRIPSY STONE EXTRACTION  10-13-2004  . CYSTO/  RIGHT RETROGRADE PYELOGRAM/  PLACMENT RIGHT URETERAL  STENT  01-10-2010  . CYSTOSCOPY W/ URETERAL STENT PLACEMENT Right 08/19/2015   Procedure: CYSTOSCOPY WITH RETROGRADE PYELOGRAM/URETERAL STENT PLACEMENT;  Surgeon: Franchot Gallo, MD;  Location: [facility];  Service: Urology;  Laterality: Right;  . CYSTOSCOPY WITH RETROGRADE PYELOGRAM, URETEROSCOPY AND STENT PLACEMENT Left 10/21/2014   Procedure: CYSTOSCOPY WITH RETROGRADE PYELOGRAM, URETEROSCOPY AND STENT PLACEMENT;  Surgeon: Franchot Gallo, MD;  Location: [facility];  Service: Urology;  Laterality: Left;  . CYSTOSCOPY WITH RETROGRADE PYELOGRAM, URETEROSCOPY AND STENT PLACEMENT Right 11/22/2015   Procedure: CYSTOSCOPY, RIGHT URETERAL STENT REMOVAL; RIGHT RETROGRADE PYELOGRAM, RIGHT URETEROSCOPY WITH BALLOON DILATION; RIGHT URETERAL STENT PLACEMENT;  Surgeon: Franchot Gallo, MD;  Location: [facility];  Service: Urology;  Laterality: Right;  . CYSTOSCOPY/RETROGRADE/URETEROSCOPY/STONE EXTRACTION WITH BASKET Bilateral 08/02/2015   Procedure: CYSTOSCOPY; BILATERAL RETROGRADE PYELOGRAMS; BILATERAL URETEROSCOPY, STONE EXTRACTION WITH BASKET;  Surgeon: Franchot Gallo, MD;  [facility]: [facility];  Service: Urology;  Laterality: Bilateral;  . CYSTOSCOPY/RETROGRADE/URETEROSCOPY/STONE EXTRACTION WITH BASKET Right 06/28/2015   Procedure: CYSTOSCOPY/RETROGRADE/URETEROSCOPY/STONE EXTRACTION WITH BASKET, RIGHT URETERAL DOUBLE J STENT PLACEMENT;  Surgeon: Franchot Gallo, MD;  Location: [facility];  Service: Urology;  Laterality: Right;  . ESOPHAGOGASTRODUODENOSCOPY (EGD) WITH PROPOFOL N/A 03/17/2013   Procedure: ESOPHAGOGASTRODUODENOSCOPY (EGD) WITH PROPOFOL;  Surgeon: Danie Binder, MD;  [facility];  Service: Endoscopy;  Laterality: N/A;  . ESOPHAGOGASTRODUODENOSCOPY (EGD) WITH PROPOFOL N/A 06/10/2015   Distal gastritis, distal esophageal stricture s/p dilation  . ESOPHAGOGASTRODUODENOSCOPY (EGD) WITH PROPOFOL N/A 02/26/2017   Procedure: ESOPHAGOGASTRODUODENOSCOPY (EGD) WITH PROPOFOL;  Surgeon: Danie Binder, MD;  [facility];  Service: Endoscopy;  Laterality: N/A;  . FLEXIBLE SIGMOIDOSCOPY  09/11/2011   TDH:RCBULAGT Internal hemorrhoids  . HOLMIUM LASER APPLICATION Left 3/64/6803   Procedure: HOLMIUM LASER APPLICATION;  Surgeon: Franchot Gallo, MD;  Location[facility];  Service: Urology;  Laterality: Left;  . HOLMIUM LASER APPLICATION Right 07/23/2480   Procedure: HOLMIUM LASER APPLICATION;  Surgeon: Franchot Gallo, MD;  Location: [facility];  Service: Urology;  Laterality: Right;  . MASS EXCISION Left 03/04/2015   Procedure: EXCISION OF SOFT TISSUE NEOPLASM LEFT ARM;  Surgeon: Aviva Signs, MD;  Location: [facility];  Service: General;  Laterality: Left;  . PERCUTANEOUS NEPHROSTOLITHOTOMY Bilateral 12/  2011     [facility]  . POLYPECTOMY N/A 03/17/2013   Procedure: GASTRIC POLYPECTOMY;  Surgeon: Danie Binder, MD;  Location[facility];  Service: Endoscopy;  Laterality: N/A;  . POLYPECTOMY  02/26/2017   Procedure: POLYPECTOMY;  Surgeon: Danie Binder, MD;  [facility];  Service: Endoscopy;;  polyp at cecum, ascending colon polyps x3, hepatic flexure polyps x8, transverse colon polyps x8   . REMOVAL RIGHT THIGH CYST  2006  . SAVORY DILATION N/A 03/17/2013   Procedure: SAVORY DILATION;  Surgeon: Danie Binder, MD;  Location: [facility];  Service:  Endoscopy;  Laterality: N/A;  #12.8, 14, 15, 16 dilators used  . SAVORY DILATION N/A 06/10/2015   Procedure: SAVORY DILATION;  Surgeon: Danie Binder, MD;  [facility];  Service: Endoscopy;  Laterality: N/A;  . TRANSTHORACIC ECHOCARDIOGRAM  01-01-2014   mild LVH/  ef 41-74%/  grade I diastolic dysfunction/  trivial MR, TR, and PR  . VAGINAL HYSTERECTOMY  1970's     Current Meds  Medication Sig  . ALPRAZolam (XANAX) 0.5 MG tablet Take one tablet at bedtime, as needed, for uncontrolled anxiety  . aspirin EC 325 MG tablet Take 325 mg by mouth daily.  . budesonide-formoterol (SYMBICORT) 80-4.5 MCG/ACT  inhaler INHALE 2 PUFFS INTO THE LUNGS TWICE A DAY  . calcium-vitamin D (OSCAL WITH D) 500-200 MG-UNIT tablet Take 1 tablet by mouth daily.  . diclofenac sodium (VOLTAREN) 1 % GEL Apply 4 g topically 4 (four) times daily.  . furosemide (LASIX) 40 MG tablet TAKE 1 TABLET BY MOUTH DAILY AS NEEDED FOR SWELLING  . lovastatin (MEVACOR) 40 MG tablet TAKE 1 TABLET AT BEDTIME  . Melatonin 10 MG TABS Take 10 mg by mouth at bedtime.  . pantoprazole (PROTONIX) 40 MG tablet TAKE 1 TABLET (40 MG TOTAL) BY MOUTH 2 (TWO) TIMES DAILY.  Marland Kitchen PARoxetine (PAXIL) 40 MG tablet TAKE 1 TABLET BY MOUTH EVERY DAY IN THE MORNING  . potassium chloride (K-DUR) 10 MEQ tablet Take 1 tablet (10 mEq total) by mouth daily.  . tizanidine (ZANAFLEX) 2 MG capsule Take 1 capsule (2 mg total) by mouth at bedtime as needed for muscle spasms.  [facility] ophthalmic ointment Place 1 application into the right eye 2 (two) times daily.  . TRADJENTA 5 MG TABS tablet TAKE 1 TABLET BY MOUTH EVERY DAY  . traZODone (DESYREL) 100 MG tablet Take 100 mg by mouth at bedtime.     Allergies:   Ace inhibitors, Keflex [cephalexin], Nitrofurantoin, and Penicillins   ROS:   Please see the history of present illness.    All other systems reviewed and are negative.   Labs/Other Tests and Data Reviewed:    Recent Labs: 03/27/2019: ALT 20; BUN 19; Creatinine, Ser 1.81; Hemoglobin 13.1; Platelets 315; Potassium 4.6; Sodium 132   Recent Lipid Panel Lab Results  Component Value Date/Time   CHOL 162 11/19/2018 12:13 PM   TRIG 121 11/19/2018 12:13 PM   HDL 47 11/19/2018 12:13 PM   CHOLHDL 3.4 11/19/2018 12:13 PM   LDLCALC 91 11/19/2018 12:13 PM   LDLCALC 103 (H) 05/21/2018 01:08 PM    Wt Readings from Last 3 Encounters:  07/01/19 198 lb (89.8 kg)  04/03/19 198 lb 8 oz (90 kg)  04/01/19 200 lb 1.3 oz (90.8 kg)     Objective:    Vital Signs:  BP 137/78   Ht 5' 6.5" (1.689 m)   Wt 198 lb (89.8 kg)   BMI 31.48 kg/m    VITAL SIGNS:   reviewed GEN:  alert and oriented  RESPIRATORY:  no shortness of breath noted in conversation  PSYCH:  normal affect and mood    ASSESSMENT & PLAN:    1. Depression, major, single episode, in partial remission ([place])   2. Type 2 diabetes with nephropathy ([place])   3. Muscle cramps   4. Canker sore   5. Hypertension goal BP (blood pressure) < 130/80   6. Obesity (BMI 30.0-34.9)    Please see problem list for H&P above diagnoses.   Time:   Today, I  have spent 20 minutes with the patient with telehealth technology discussing the above problems.     Medication Adjustments/Labs and Tests Ordered: Current medicines are reviewed at length with the patient today.  Concerns regarding medicines are outlined above.   Tests Ordered: No orders of the defined types were placed in this encounter.   Medication Changes: No orders of the defined types were placed in this encounter.   Disposition:  Follow up 07/13/2019  Signed, Perlie Mayo, NP  07/05/2019 6:16 PM     [facility]

## 2019-07-05 ENCOUNTER — Encounter: Payer: Self-pay | Admitting: Family Medicine

## 2019-07-05 DIAGNOSIS — K120 Recurrent oral aphthae: Secondary | ICD-10-CM | POA: Insufficient documentation

## 2019-07-05 NOTE — Assessment & Plan Note (Signed)
Cheryl Abbott is encouraged to maintain a well balanced diet that is low in salt. Controlled, continue current medication regimen.  Additionally, she is also reminded that exercise is beneficial for heart health and control of  Blood pressure. 30-60 minutes daily is recommended-walking was suggested.

## 2019-07-05 NOTE — Assessment & Plan Note (Signed)
  Cheryl Abbott is re-educated about the importance of exercise daily to help with weight management. A minumum of 30 minutes daily is recommended. Additionally, importance of healthy food choices  with portion control discussed.   Wt Readings from Last 3 Encounters:  07/01/19 198 lb (89.8 kg)  04/03/19 198 lb 8 oz (90 kg)  04/01/19 200 lb 1.3 oz (90.8 kg)

## 2019-07-05 NOTE — Assessment & Plan Note (Signed)
Cheryl Abbott is encouraged to check blood sugar daily as directed. Continue current medications. Is on statin as well. Educated on importance of maintain a well balanced diabetic friendly diet. She is reminded the importance of maintaining  good blood sugars,  taking medications as directed, daily foot care, annual eye exams. Additionally educated about keeping good control over blood pressure and cholesterol as well.

## 2019-07-05 NOTE — Assessment & Plan Note (Signed)
She is encouraged to use her muscle rubs  if she needs anything for muscle cramps that she gets at night.  If she continues to have the she is advised to let us know.

## 2019-07-05 NOTE — Assessment & Plan Note (Signed)
Reports doing better. No SI or HI  Continue medication

## 2019-07-05 NOTE — Assessment & Plan Note (Signed)
Advised to do salt water gargles.  To avoid brushing hard in that area.  If symptoms do not get better or get worse advised to let us know.

## 2019-07-13 ENCOUNTER — Other Ambulatory Visit: Payer: Self-pay

## 2019-07-13 ENCOUNTER — Ambulatory Visit: Payer: Medicare Other | Admitting: Family Medicine

## 2019-07-13 ENCOUNTER — Encounter: Payer: Self-pay | Admitting: Family Medicine

## 2019-07-13 ENCOUNTER — Ambulatory Visit (INDEPENDENT_AMBULATORY_CARE_PROVIDER_SITE_OTHER): Payer: Medicare Other | Admitting: Family Medicine

## 2019-07-13 VITALS — BP 110/70 | Ht 66.0 in | Wt 198.0 lb

## 2019-07-13 DIAGNOSIS — E785 Hyperlipidemia, unspecified: Secondary | ICD-10-CM

## 2019-07-13 DIAGNOSIS — I10 Essential (primary) hypertension: Secondary | ICD-10-CM | POA: Diagnosis not present

## 2019-07-13 DIAGNOSIS — E1121 Type 2 diabetes mellitus with diabetic nephropathy: Secondary | ICD-10-CM

## 2019-07-13 DIAGNOSIS — M4722 Other spondylosis with radiculopathy, cervical region: Secondary | ICD-10-CM | POA: Diagnosis not present

## 2019-07-13 DIAGNOSIS — G8929 Other chronic pain: Secondary | ICD-10-CM

## 2019-07-13 DIAGNOSIS — M25512 Pain in left shoulder: Secondary | ICD-10-CM

## 2019-07-13 DIAGNOSIS — E669 Obesity, unspecified: Secondary | ICD-10-CM

## 2019-07-13 DIAGNOSIS — M47812 Spondylosis without myelopathy or radiculopathy, cervical region: Secondary | ICD-10-CM | POA: Insufficient documentation

## 2019-07-13 NOTE — Progress Notes (Signed)
Virtual Visit via Telephone Note  I connected with Cheryl Abbott on 07/13/19 at  2:40 PM EST by telephone and verified that I am speaking with the correct person using two identifiers.  Location: Patient:home Provider: office   I discussed the limitations, risks, security and privacy concerns of performing an evaluation and management service by telephone and the availability of in person appointments. I also discussed with the patient that there may be a patient responsible charge related to this service. The patient expressed understanding and agreed to proceed.   History of Present Illness:   F/U chronic problems, medication review, and refill medication when necessary. Review most recent labs and order labs which are due Review preventive health and update with necessary referrals or immunizations as indicated Denies recent fever or chills. Denies sinus pressure, nasal congestion, ear pain or sore throat. Denies chest congestion, productive cough or wheezing. Denies chest pains, palpitations and leg swelling Denies abdominal pain, nausea, vomiting,diarrhea or constipation.   Denies dysuria, frequency, hesitancy or incontinence. C/o right shoulder pain and reduced mobility, no recent trauma Denies headaches, seizures, numbness, or tingling. Denies uncontrolled  depression, anxiety or insomnia. Denies skin break down or rash.     Observations/Objective: BP 110/70   Ht 5\' 6"  (1.676 m)   Wt 198 lb (89.8 kg)   BMI 31.96 kg/m  Good communication with no confusion and intact memory. Alert and oriented x 3 No signs of respiratory distress during speech    Assessment and Plan:  Shoulder pain, left Neck pain with radiaction, refer to PT  Hypertension goal BP (blood pressure) < 130/80 Controlled, no change in medication DASH diet and commitment to daily physical activity for a minimum of 30 minutes discussed and encouraged, as a part of hypertension management. The  importance of attaining a healthy weight is also discussed.  BP/Weight 07/13/2019 07/01/2019 05/18/2019 04/03/2019 04/01/2019 03/12/2019 1/44/3154  Systolic BP 008 676 195 093 267 124 580  Diastolic BP 70 78 78 67 60 80 81  Wt. (Lbs) 198 198 - 198.5 200.08 199.4 -  BMI 31.96 31.48 - 31.56 31.81 32.18 -       Hyperlipidemia LDL goal <100 Hyperlipidemia:Low fat diet discussed and encouraged.   Lipid Panel  Lab Results  Component Value Date   CHOL 162 11/19/2018   HDL 47 11/19/2018   LDLCALC 91 11/19/2018   TRIG 121 11/19/2018   CHOLHDL 3.4 11/19/2018     Updated lab needed at/ before next visit.   Obesity (BMI 30.0-34.9)  Patient re-educated about  the importance of commitment to a  minimum of 150 minutes of exercise per week as able.  The importance of healthy food choices with portion control discussed, as well as eating regularly and within a 12 hour window most days. The need to choose "clean , green" food 50 to 75% of the time is discussed, as well as to make water the primary drink and set a goal of 64 ounces water daily.    Weight /BMI 07/13/2019 07/01/2019 04/03/2019  WEIGHT 198 lb 198 lb 198 lb 8 oz  HEIGHT 5\' 6"  5' 6.5" -  BMI 31.96 kg/m2 31.48 kg/m2 31.56 kg/m2      Type 2 diabetes with nephropathy ([place]) Ms. Wehrman is reminded of the importance of commitment to daily physical activity for 30 minutes or more, as able and the need to limit carbohydrate intake to 30 to 60 grams per meal to help with blood sugar control.   The need  to take medication as prescribed, test blood sugar as directed, and to call between visits if there is a concern that blood sugar is uncontrolled is also discussed.   Ms. Mancini is reminded of the importance of daily foot exam, annual eye examination, and good blood sugar, blood pressure and cholesterol control. Updated lab needed at/ before next visit.   Diabetic Labs Latest Ref Rng & Units 03/27/2019 02/06/2019 01/09/2019 01/05/2019  01/03/2019  HbA1c 4.8 - 5.6 % - - - - -  Microalbumin mg/dL - - - - -  Micro/Creat Ratio 0.0 - 30.0 mg/g - - - - -  Chol 0 - 200 mg/dL - - - - -  HDL >40 mg/dL - - - - -  Calc LDL 0 - 99 mg/dL - - - - -  Triglycerides <150 mg/dL - - - - -  Creatinine 0.44 - 1.00 mg/dL 1.81(H) 1.53(H) 1.49(H) 1.47(H) 1.61(H)   BP/Weight 07/13/2019 07/01/2019 05/18/2019 04/03/2019 04/01/2019 03/12/2019 08/24/4006  Systolic BP 676 195 093 267 124 580 998  Diastolic BP 70 78 78 67 60 80 81  Wt. (Lbs) 198 198 - 198.5 200.08 199.4 -  BMI 31.96 31.48 - 31.56 31.81 32.18 -   Foot/eye exam completion dates Latest Ref Rng & Units 12/24/2018 05/19/2018  Eye Exam No Retinopathy - -  Foot exam Order - - -  Foot Form Completion - Done Done         Follow Up Instructions:    I discussed the assessment and treatment plan with the patient. The patient was provided an opportunity to ask questions and all were answered. The patient agreed with the plan and demonstrated an understanding of the instructions.   The patient was advised to call back or seek an in-person evaluation if the symptoms worsen or if the condition fails to improve as anticipated.  I provided  20 minutes of non-face-to-face time during this encounter.   Tula Nakayama, MD

## 2019-07-13 NOTE — Patient Instructions (Signed)
F/U in 4 months, call if you need me before  Please discuss abdominal pain with Dr Oneida Alar at your next appointment  Please get at hospital this month fasting lipid, cmp and EGFr, HBA1C, and TSH and microalb  It is important that you exercise regularly at least 30 minutes 5 times a week. If you develop chest pain, have severe difficulty breathing, or feel very tired, stop exercising immediately and seek medical attention   You are referred to physical therapy for neck pain  It is important that you exercise regularly at least 30 minutes 5 times a week. If you develop chest pain, have severe difficulty breathing, or feel very tired, stop exercising immediately and seek medical attention  Think about what you will eat, plan ahead. Choose " clean, green, fresh or frozen" over canned, processed or packaged foods which are more sugary, salty and fatty. 70 to 75% of food eaten should be vegetables and fruit. Three meals at set times with snacks allowed between meals, but they must be fruit or vegetables. Aim to eat over a 12 hour period , example 7 am to 7 pm, and STOP after  your last meal of the day. Drink water,generally about 64 ounces per day, no other drink is as healthy. Fruit juice is best enjoyed in a healthy way, by EATING the fruit. Thanks for choosing [facility], we consider it a privelige to serve you.

## 2019-07-16 ENCOUNTER — Encounter (HOSPITAL_COMMUNITY): Payer: Self-pay | Admitting: Physical Therapy

## 2019-07-16 ENCOUNTER — Ambulatory Visit (HOSPITAL_COMMUNITY): Payer: Medicare Other | Attending: Family Medicine | Admitting: Physical Therapy

## 2019-07-16 ENCOUNTER — Other Ambulatory Visit: Payer: Self-pay

## 2019-07-16 DIAGNOSIS — R262 Difficulty in walking, not elsewhere classified: Secondary | ICD-10-CM | POA: Diagnosis not present

## 2019-07-16 DIAGNOSIS — M542 Cervicalgia: Secondary | ICD-10-CM | POA: Diagnosis not present

## 2019-07-16 NOTE — Therapy (Signed)
[place], [place], [address] Phone: [phone]   Fax:  [phone]  Physical Therapy Evaluation  Patient Details  Name: Cheryl Abbott MRN: [id number] Date of Birth: [birth year] Referring Provider (PT): Tula Nakayama   Encounter Date: 07/16/2019  PT End of Session - 07/16/19 1534    Visit Number  1    Number of Visits  6    Date for PT Re-Evaluation  08/27/19    Authorization Type  [employer] medicare    Authorization Time Period  no auth required. POC dates 07/16/19 to 08/27/2019    Authorization - Visit Number  1    Authorization - Number of Visits  6    PT Start Time  7616    PT Stop Time  0737    PT Time Calculation (min)  45 min    Activity Tolerance  Patient limited by pain;Patient limited by fatigue    Behavior During Therapy  Restless       Past Medical History:  Diagnosis Date  . Anemia   . Anxiety   . Anxiety disorder   . Arthritis   . Cancer ([place])    acute myeloma  . 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 Chronic back pain   . Depression   . Fatty liver   . GERD (gastroesophageal reflux disease)   . Headache   . History of adenomatous polyp of colon    2008  . History of kidney stones   . Hypercalcemia   . Hyperlipidemia   . Hypertension   . Left ureteral stone   . Lipoma of arm 01/19/2015  . Lumbar disc disease with radiculopathy   . Nephrolithiasis   . Neuropathy, peripheral   . OSA treated with BiPAP    per study 2007  . RENAL CALCULUS 03/15/2009   Qualifier: Diagnosis of  By: Zeb Comfort    . Sciatica   . Shoulder pain, bilateral   . Sigmoid diverticulosis   . Type 2 diabetes mellitus ([place])   . Vaginal dryness, menopausal 10/14/2016    Past Surgical History:  Procedure Laterality Date  . BIOPSY N/A 03/17/2013   Procedure: GASTRIC BIOPSIES;  Surgeon: Danie Binder, MD;  Location: [facility];  Service: Endoscopy;  Laterality: N/A;  . BIOPSY  06/10/2015   Procedure: BIOPSY;   Surgeon: Danie Binder, MD;  [facility];  Service: Endoscopy;;  gastric biopsy  . CARDIAC CATHETERIZATION  05-11-2003  dr Shelva Majestic ([facility])   Abnormal cardiolite/   Normal coronary arteries and normal LVF,  ef  63%  . CARDIOVASCULAR STRESS TEST  01-01-2014   normal lexiscan cardiolite/  no ischemia/ infarct/  normal LV function and wall motion ,  ef 81%  . COLONOSCOPY  last one 10/02/2011   MOD Simpson TICS, IH, NEXT TCS APR 2018  . COLONOSCOPY WITH PROPOFOL N/A 02/26/2017   Procedure: COLONOSCOPY WITH PROPOFOL;  Surgeon: Danie Binder, MD;  [facility];  Service: Endoscopy;  Laterality: N/A;  11:00am  . CYSTO/   RIGHT URETEROSOCOPY LASER LITHOTRIPSY STONE EXTRACTION  10-13-2004  . CYSTO/  RIGHT RETROGRADE PYELOGRAM/  PLACMENT RIGHT URETERAL STENT  01-10-2010  . CYSTOSCOPY W/ URETERAL STENT PLACEMENT Right 08/19/2015   Procedure: CYSTOSCOPY WITH RETROGRADE PYELOGRAM/URETERAL STENT PLACEMENT;  Surgeon: Franchot Gallo, MD;  Location: [facility];  Service: Urology;  Laterality: Right;  . CYSTOSCOPY WITH RETROGRADE PYELOGRAM, URETEROSCOPY AND STENT PLACEMENT Left 10/21/2014   Procedure:  [place], URETEROSCOPY AND STENT PLACEMENT;  Surgeon: Franchot Gallo, MD;  Location: [facility];  Service: Urology;  Laterality: Left;  . CYSTOSCOPY WITH RETROGRADE PYELOGRAM, URETEROSCOPY AND STENT PLACEMENT Right 11/22/2015   Procedure: CYSTOSCOPY, RIGHT URETERAL STENT REMOVAL; RIGHT RETROGRADE PYELOGRAM, RIGHT URETEROSCOPY WITH BALLOON DILATION; RIGHT URETERAL STENT PLACEMENT;  Surgeon: Franchot Gallo, MD;  Location: [facility];  Service: Urology;  Laterality: Right;  . CYSTOSCOPY/RETROGRADE/URETEROSCOPY/STONE EXTRACTION WITH BASKET Bilateral 08/02/2015   Procedure: CYSTOSCOPY; BILATERAL RETROGRADE PYELOGRAMS; BILATERAL URETEROSCOPY, STONE EXTRACTION WITH BASKET;  Surgeon: Franchot Gallo, MD;  Location: [facility];  Service: Urology;   Laterality: Bilateral;  . CYSTOSCOPY/RETROGRADE/URETEROSCOPY/STONE EXTRACTION WITH BASKET Right 06/28/2015   Procedure: CYSTOSCOPY/RETROGRADE/URETEROSCOPY/STONE EXTRACTION WITH BASKET, RIGHT URETERAL DOUBLE J STENT PLACEMENT;  Surgeon: Franchot Gallo, MD;  Location: [facility];  Service: Urology;  Laterality: Right;  . ESOPHAGOGASTRODUODENOSCOPY (EGD) WITH PROPOFOL N/A 03/17/2013   Procedure: ESOPHAGOGASTRODUODENOSCOPY (EGD) WITH PROPOFOL;  Surgeon: Danie Binder, MD;  Location: [facility];  Service: Endoscopy;  Laterality: N/A;  . ESOPHAGOGASTRODUODENOSCOPY (EGD) WITH PROPOFOL N/A 06/10/2015   Distal gastritis, distal esophageal stricture s/p dilation  . ESOPHAGOGASTRODUODENOSCOPY (EGD) WITH PROPOFOL N/A 02/26/2017   Procedure: ESOPHAGOGASTRODUODENOSCOPY (EGD) WITH PROPOFOL;  Surgeon: Danie Binder, MD;  [facility];  Service: Endoscopy;  Laterality: N/A;  . FLEXIBLE SIGMOIDOSCOPY  09/11/2011   [id number] Internal hemorrhoids  . HOLMIUM LASER APPLICATION Left 5/85/9292   Procedure: HOLMIUM LASER APPLICATION;  Surgeon: Franchot Gallo, MD;  Location: [facility];  Service: Urology;  Laterality: Left;  . HOLMIUM LASER APPLICATION Right 4/46/2863   Procedure: HOLMIUM LASER APPLICATION;  Surgeon: Franchot Gallo, MD;  Location: [facility];  Service: Urology;  Laterality: Right;  . MASS EXCISION Left 03/04/2015   Procedure: EXCISION OF SOFT TISSUE NEOPLASM LEFT ARM;  Surgeon: Aviva Signs, MD;  Location: [facility];  Service: General;  Laterality: Left;  . PERCUTANEOUS NEPHROSTOLITHOTOMY Bilateral 12/  2011     [facility]  . POLYPECTOMY N/A 03/17/2013   Procedure: GASTRIC POLYPECTOMY;  Surgeon: Danie Binder, MD;  Location: [facility];  Service: Endoscopy;  Laterality: N/A;  . POLYPECTOMY  02/26/2017   Procedure: POLYPECTOMY;  Surgeon: Danie Binder, MD;  [facility];  Service: Endoscopy;;  polyp at cecum, ascending colon polyps x3, hepatic flexure polyps x8, transverse colon  polyps x8   . REMOVAL RIGHT THIGH CYST  2006  . SAVORY DILATION N/A 03/17/2013   Procedure: SAVORY DILATION;  Surgeon: Danie Binder, MD;  Location: [facility];  Service: Endoscopy;  Laterality: N/A;  #12.8, 14, 15, 16 dilators used  . SAVORY DILATION N/A 06/10/2015   Procedure: SAVORY DILATION;  Surgeon: Danie Binder, MD;  [facility];  Service: Endoscopy;  Laterality: N/A;  . TRANSTHORACIC ECHOCARDIOGRAM  01-01-2014   mild LVH/  ef 81-77%/  grade I diastolic dysfunction/  trivial MR, TR, and PR  . VAGINAL HYSTERECTOMY  1970's    There were no vitals filed for this visit.   Subjective Assessment - 07/16/19 1501    Subjective  Patient complains of chronic neck pain that radiates into her shoulders with left side worse then right. States that she has difficulties washing her hair as it is painful. States that she also doesn't sleep well due to pain and sleeplessness. Reports that she saw a neurosurgeon who wants to avoid surgery given her comorbidities. States she has a lot of health conditions which would keep her from doing much exercise. She  also has equilibrium issues and difficulty walking.    Pertinent History  myeloma (cancer of blood), HTN, DB, anemic    Limitations  Sitting;Lifting;Standing;Walking    Currently in Pain?  Yes    Pain Score  8     Pain Location  Neck    Pain Orientation  Left    Pain Descriptors / Indicators  Aching;Stabbing;Tender    Pain Type  Chronic pain    Pain Radiating Towards  left shoulder    Pain Onset  More than a month ago    Pain Frequency  Constant    Aggravating Factors   movement    Pain Relieving Factors  icyhot         [facility] PT Assessment - 07/16/19 0001      Assessment   Medical Diagnosis  Cervical spondylosis with radiculopathy    Referring Provider (PT)  Tula Nakayama    Prior Therapy  yes had PT at this clinic before      Precautions   Precautions  Fall      Balance Screen   Has the patient fallen in the past 6  months  No    Has the patient had a decrease in activity level because of a fear of falling?   Yes    Is the patient reluctant to leave their home because of a fear of falling?   Yes      [place] residence    Living Arrangements  Alone    Type of [place] to enter    Entrance Stairs-Number of Steps  2      Prior Function   Level of Independence  Independent with basic ADLs    Vocation  Retired      Associate Professor   Overall Cognitive Status  No family/caregiver present to determine baseline cognitive functioning    Attention  Selective;Alternating    Selective Attention  Impaired    Alternating Attention  Impaired      Observation/Other Assessments   Focus on Therapeutic Outcomes (FOTO)   65% limited      ROM / Strength   AROM / PROM / Strength  Strength;AROM      AROM   AROM Assessment Site  Cervical;Shoulder    Right/Left Shoulder  Right;Left    Right Shoulder Flexion  120 Degrees    Right Shoulder ABduction  90 Degrees   pain in deltoid region   Left Shoulder Flexion  80 Degrees   pain in left arm    Left Shoulder ABduction  90 Degrees   severe pain in deltoid region   Cervical Flexion  40    Cervical Extension  35   pain in the back of head    Cervical - Right Rotation  65   slight pain on left side.   Cervical - Left Rotation  45   pain in the back of her shoulder and neck on left side     Strength   Strength Assessment Site  Shoulder    Right/Left Shoulder  Right;Left      Ambulation/Gait   Ambulation/Gait  Yes    Ambulation Distance (Feet)  25 Feet    Assistive device  None    Gait Pattern  Decreased step length - left;Decreased step length - right;Trunk flexed   deviated from straight path   Gait velocity  decreased  Objective measurements completed on examination: See above findings.              PT Education - 07/16/19 1534    Education Details  on  current presentation and plan moving forward    Person(s) Educated  Patient    Methods  Explanation    Comprehension  Verbalized understanding       PT Short Term Goals - 07/16/19 1550      PT SHORT TERM GOAL #1   Title  Patient will be independent in HEP to improve functional outcomes.    Time  3    Period  Weeks    Status  New    Target Date  08/06/19      PT SHORT TERM GOAL #2   Title  Patient will report at least 25% improvement in overall symptoms to improve QOL.    Time  3    Period  Weeks    Status  New    Target Date  08/06/19        PT Long Term Goals - 07/16/19 1551      PT LONG TERM GOAL #1   Title  Patient will demonstrate at least 60 degrees of cervical rotation in both directions to improve spinal mobility.    Time  6    Period  Weeks    Status  New    Target Date  08/27/19      PT LONG TERM GOAL #2   Title  Patient will report at least 50% improvement in overall symptoms to improve QOL.    Time  6    Period  Days    Status  New    Target Date  08/27/19             Plan - 07/16/19 1535    Clinical Impression Statement  Patient presents with chronic neck pain and has recently sought out spinal surgeon as her pain is constant and preventing her from sleeping and functioning. In addition to her neck pain, patient also complains of chronic neck pain and equilibrium issues. Patient would like to try physical therapy to try and avoid surgery but stated she wouldn't be able to tolerate much exercise. Patient would benefit from skilled physical therapy focusing on improving functional cervical mobility and decrease pain. However, patient has guarded prognosis secondary to anticipated tolerated to physical therapy interventions and other comorbidities.    Personal Factors and Comorbidities  Age;Comorbidity 1;Comorbidity 2;Comorbidity 3+;Transportation    Comorbidities  DB, Myeloma, HTN    Examination-Activity Limitations  Sleep;Sit;Bathing;Bed  Mobility;Hygiene/Grooming;Carry;Squat;Transfers    Examination-Participation Restrictions  Cleaning;Driving;Meal Prep;Shop    Stability/Clinical Decision Making  Evolving/Moderate complexity    Clinical Decision Making  Moderate    Rehab Potential  Poor    PT Frequency  1x / week    PT Duration  6 weeks    PT Treatment/Interventions  ADLs/Self Care Home Management;Cryotherapy;Electrical Stimulation;Moist Heat;Traction;Balance training;Therapeutic exercise;Therapeutic activities;Functional mobility training;Stair training;Gait training;Neuromuscular re-education;Patient/family education;Manual techniques;Passive range of motion    PT Next Visit Plan  cervical mobility, soft tisse mobilization as needed for pain, posture exercises    PT Home Exercise Plan  initiate next session    Consulted and Agree with Plan of Care  Patient       Patient will benefit from skilled therapeutic intervention in order to improve the following deficits and impairments:  Pain, Decreased balance, Decreased mobility, Difficulty walking, Decreased range of motion, Decreased activity tolerance  Visit Diagnosis: Cervicalgia  Difficulty in walking, not elsewhere classified     Problem List Patient Active Problem List   Diagnosis Date Noted  . Cervical spondylosis 07/13/2019  . Cervical spondylosis with radiculopathy 07/13/2019  . Canker sore 07/05/2019  . Muscle cramps 07/01/2019  . Depression, major, single episode, in partial remission ([place]) 03/13/2019  . Annual physical exam 03/12/2019  . Eyelid cyst, right 01/15/2019  . Sepsis ([place]) 01/02/2019  . 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 Shoulder pain, left 02/28/2018  . Palpable mass of neck 02/28/2018  . Exertional dyspnea 02/28/2018  . Insomnia 11/09/2017  . Multiple myeloma ([place]) 03/15/2017  . Urinary frequency 02/28/2017  . 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 Neuropathic pain of both feet 04/11/2016  . TIA (transient ischemic attack)  02/26/2016  . Lactose intolerance in adult 02/01/2016  . Brain TIA 08/19/2015  . CKD (chronic kidney disease) stage 3, GFR 30-59 ml/min ([facility]) 07/12/2015  . Lipoma of arm 01/19/2015  . Anemia 10/14/2014  . Lumbar back pain with radiculopathy affecting left lower extremity 05/11/2014  . Nocturnal hypoxia 02/04/2014  . Obesity (BMI 30.0-34.9) 01/02/2014  . Generalized anxiety disorder 05/20/2013  . Anxiety and depression 08/13/2012  . Abnormal brain scan 02/07/2012  . Thoracic or lumbosacral neuritis or radiculitis, unspecified 12/22/2010  . Hypercalcemia 11/30/2010  . Colon adenomas 05/11/2009  . HEMORRHOIDS 03/15/2009  . GERD 03/15/2009  . FATTY LIVER DISEASE 03/15/2009  . Sleep apnea 09/15/2008  . 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 Hyperlipidemia LDL goal <100 09/17/2007  . Hypertension goal BP (blood pressure) < 130/80 09/17/2007   4:04 PM, 07/16/19 Jerene Pitch, DPT Physical Therapy with [facility]  [phone] office  [place] [address] [place], [place], [address] Phone: [phone]   Fax:  [phone]  Name: ZYLA DASCENZO MRN: [id number] Date of Birth: Mar 25, 1946

## 2019-07-21 DIAGNOSIS — R0902 Hypoxemia: Secondary | ICD-10-CM | POA: Diagnosis not present

## 2019-07-22 ENCOUNTER — Encounter: Payer: Self-pay | Admitting: Family Medicine

## 2019-07-22 NOTE — Assessment & Plan Note (Addendum)
Neck pain with radiaction, refer to PT

## 2019-07-22 NOTE — Assessment & Plan Note (Addendum)
Cheryl Abbott is reminded of the importance of commitment to daily physical activity for 30 minutes or more, as able and the need to limit carbohydrate intake to 30 to 60 grams per meal to help with blood sugar control.   The need to take medication as prescribed, test blood sugar as directed, and to call between visits if there is a concern that blood sugar is uncontrolled is also discussed.   Cheryl Abbott is reminded of the importance of daily foot exam, annual eye examination, and good blood sugar, blood pressure and cholesterol control. Updated lab needed at/ before next visit.   Diabetic Labs Latest Ref Rng & Units 03/27/2019 02/06/2019 01/09/2019 01/05/2019 01/03/2019  HbA1c 4.8 - 5.6 % - - - - -  Microalbumin mg/dL - - - - -  Micro/Creat Ratio 0.0 - 30.0 mg/g - - - - -  Chol 0 - 200 mg/dL - - - - -  HDL >40 mg/dL - - - - -  Calc LDL 0 - 99 mg/dL - - - - -  Triglycerides <150 mg/dL - - - - -  Creatinine 0.44 - 1.00 mg/dL 1.81(H) 1.53(H) 1.49(H) 1.47(H) 1.61(H)   BP/Weight 07/13/2019 07/01/2019 05/18/2019 04/03/2019 04/01/2019 03/12/2019 1/75/1025  Systolic BP 852 778 242 353 614 431 540  Diastolic BP 70 78 78 67 60 80 81  Wt. (Lbs) 198 198 - 198.5 200.08 199.4 -  BMI 31.96 31.48 - 31.56 31.81 32.18 -   Foot/eye exam completion dates Latest Ref Rng & Units 12/24/2018 05/19/2018  Eye Exam No Retinopathy - -  Foot exam Order - - -  Foot Form Completion - Done Done

## 2019-07-22 NOTE — Assessment & Plan Note (Signed)
  Patient re-educated about  the importance of commitment to a  minimum of 150 minutes of exercise per week as able.  The importance of healthy food choices with portion control discussed, as well as eating regularly and within a 12 hour window most days. The need to choose "clean , green" food 50 to 75% of the time is discussed, as well as to make water the primary drink and set a goal of 64 ounces water daily.    Weight /BMI 07/13/2019 07/01/2019 04/03/2019  WEIGHT 198 lb 198 lb 198 lb 8 oz  HEIGHT 5\' 6"  5' 6.5" -  BMI 31.96 kg/m2 31.48 kg/m2 31.56 kg/m2

## 2019-07-22 NOTE — Assessment & Plan Note (Signed)
Hyperlipidemia:Low fat diet discussed and encouraged.   Lipid Panel  Lab Results  Component Value Date   CHOL 162 11/19/2018   HDL 47 11/19/2018   LDLCALC 91 11/19/2018   TRIG 121 11/19/2018   CHOLHDL 3.4 11/19/2018     Updated lab needed at/ before next visit.

## 2019-07-22 NOTE — Assessment & Plan Note (Signed)
Controlled, no change in medication DASH diet and commitment to daily physical activity for a minimum of 30 minutes discussed and encouraged, as a part of hypertension management. The importance of attaining a healthy weight is also discussed.  BP/Weight 07/13/2019 07/01/2019 05/18/2019 04/03/2019 04/01/2019 03/12/2019 2/34/1443  Systolic BP 601 658 006 349 494 473 958  Diastolic BP 70 78 78 67 60 80 81  Wt. (Lbs) 198 198 - 198.5 200.08 199.4 -  BMI 31.96 31.48 - 31.56 31.81 32.18 -

## 2019-07-23 ENCOUNTER — Encounter (HOSPITAL_COMMUNITY): Payer: Self-pay

## 2019-07-23 ENCOUNTER — Other Ambulatory Visit: Payer: Self-pay | Admitting: *Deleted

## 2019-07-23 ENCOUNTER — Ambulatory Visit (INDEPENDENT_AMBULATORY_CARE_PROVIDER_SITE_OTHER): Payer: Medicare Other | Admitting: Gastroenterology

## 2019-07-23 ENCOUNTER — Encounter: Payer: Self-pay | Admitting: Gastroenterology

## 2019-07-23 ENCOUNTER — Encounter: Payer: Self-pay | Admitting: *Deleted

## 2019-07-23 ENCOUNTER — Other Ambulatory Visit: Payer: Self-pay

## 2019-07-23 ENCOUNTER — Ambulatory Visit (HOSPITAL_COMMUNITY): Payer: Medicare Other

## 2019-07-23 DIAGNOSIS — D126 Benign neoplasm of colon, unspecified: Secondary | ICD-10-CM | POA: Diagnosis not present

## 2019-07-23 DIAGNOSIS — K219 Gastro-esophageal reflux disease without esophagitis: Secondary | ICD-10-CM | POA: Diagnosis not present

## 2019-07-23 DIAGNOSIS — R140 Abdominal distension (gaseous): Secondary | ICD-10-CM | POA: Diagnosis not present

## 2019-07-23 DIAGNOSIS — M542 Cervicalgia: Secondary | ICD-10-CM | POA: Diagnosis not present

## 2019-07-23 DIAGNOSIS — R262 Difficulty in walking, not elsewhere classified: Secondary | ICD-10-CM | POA: Diagnosis not present

## 2019-07-23 MED ORDER — METRONIDAZOLE 500 MG PO TABS: 500.0000 mg | ORAL_TABLET | Freq: Two times a day (BID) | ORAL | 0 refills | Status: DC

## 2019-07-23 MED ORDER — PEG 3350-KCL-NA BICARB-NACL 420 G PO SOLR: 4000.0000 mL | Freq: Once | ORAL | 0 refills | Status: AC

## 2019-07-23 MED ORDER — CIPROFLOXACIN HCL 500 MG PO TABS: ORAL_TABLET | ORAL | 0 refills | Status: DC

## 2019-07-23 NOTE — Assessment & Plan Note (Signed)
SYMPTOMS NOT IDEALLY CONTROLLED AND MAY BE DUE TO SMALL INTESTINE BACTERIAL OVERGROWTH OR DAIRY INTAKE.  TO REDUCE BLOATING:   1. AVOID CREAMER AND DAIRY. IF YOU WANT TO CONSUME DAIRY, ADD LACTASE 3 PILLS WITH MEALS UP TO THREE TIMES A DAY.   2. TAKE CIPRO & FLAGYL TWICE DAILY FOR 5 DAYS. MEDICATION SIDE EFFECTS INCLUDE HEEL PAIN, NAUSEA, VOMITING. AVOID ALCOHOL AND COUGH SYRUP WITH ALCOHOL.  FOLLOW UP IN 1 YEAR.

## 2019-07-23 NOTE — Therapy (Signed)
[place], [place], [address] Phone: [phone]   Fax:  [phone]  Physical Therapy Treatment  Patient Details  Name: Cheryl Abbott MRN: [id number] Date of Birth: [birth year] Referring Provider (PT): Tula Nakayama   Encounter Date: 07/23/2019  PT End of Session - 07/23/19 1139    Visit Number  2    Number of Visits  6    Date for PT Re-Evaluation  08/27/19    Authorization Type  [employer] medicare    Authorization Time Period  no auth required. POC dates 07/16/19 to 08/27/2019    Authorization - Visit Number  2    Authorization - Number of Visits  6    PT Start Time  6553    PT Stop Time  1216    PT Time Calculation (min)  43 min    Activity Tolerance  Patient limited by pain;Patient limited by fatigue;No increased pain   Reports pain reduced to 23/10 at EOS, was 5/10 at beginning of session   Behavior During Therapy  [facility] for tasks assessed/performed       Past Medical History:  Diagnosis Date  . Anemia   . Anxiety   . Anxiety disorder   . Arthritis   . Cancer ([place])    acute myeloma  . 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 Chronic back pain   . Depression   . Fatty liver   . GERD (gastroesophageal reflux disease)   . Headache   . History of adenomatous polyp of colon    2008  . History of kidney stones   . Hypercalcemia   . Hyperlipidemia   . Hypertension   . Left ureteral stone   . Lipoma of arm 01/19/2015  . Lumbar disc disease with radiculopathy   . Nephrolithiasis   . Neuropathy, peripheral   . OSA treated with BiPAP    per study 2007  . RENAL CALCULUS 03/15/2009   Qualifier: Diagnosis of  By: Zeb Comfort    . Sciatica   . Shoulder pain, bilateral   . Sigmoid diverticulosis   . Type 2 diabetes mellitus ([place])   . Vaginal dryness, menopausal 10/14/2016    Past Surgical History:  Procedure Laterality Date  . BIOPSY N/A 03/17/2013   Procedure: GASTRIC BIOPSIES;  Surgeon: Danie Binder, MD;  Location: [facility];  Service: Endoscopy;  Laterality: N/A;  . BIOPSY  06/10/2015   Procedure: BIOPSY;  Surgeon: Danie Binder, MD;  [facility];  Service: Endoscopy;;  gastric biopsy  . CARDIAC CATHETERIZATION  05-11-2003  dr Shelva Majestic ([facility])   Abnormal cardiolite/   Normal coronary arteries and normal LVF,  ef  63%  . CARDIOVASCULAR STRESS TEST  01-01-2014   normal lexiscan cardiolite/  no ischemia/ infarct/  normal LV function and wall motion ,  ef 81%  . COLONOSCOPY  last one 10/02/2011   MOD [place] TICS, IH, NEXT TCS APR 2018  . COLONOSCOPY WITH PROPOFOL N/A 02/26/2017   Procedure: COLONOSCOPY WITH PROPOFOL;  Surgeon: Danie Binder, MD;  [facility];  Service: Endoscopy;  Laterality: N/A;  11:00am  . CYSTO/   RIGHT URETEROSOCOPY LASER LITHOTRIPSY STONE EXTRACTION  10-13-2004  . CYSTO/  RIGHT RETROGRADE PYELOGRAM/  PLACMENT RIGHT URETERAL STENT  01-10-2010  . CYSTOSCOPY W/ URETERAL STENT PLACEMENT Right 08/19/2015   Procedure: CYSTOSCOPY WITH RETROGRADE PYELOGRAM/URETERAL STENT PLACEMENT;  Surgeon: Franchot Gallo, MD;  Location: [facility];  Service:  Urology;  Laterality: Right;  . CYSTOSCOPY WITH RETROGRADE PYELOGRAM, URETEROSCOPY AND STENT PLACEMENT Left 10/21/2014   Procedure: CYSTOSCOPY WITH RETROGRADE PYELOGRAM, URETEROSCOPY AND STENT PLACEMENT;  Surgeon: Franchot Gallo, MD;  Location: [facility];  Service: Urology;  Laterality: Left;  . CYSTOSCOPY WITH RETROGRADE PYELOGRAM, URETEROSCOPY AND STENT PLACEMENT Right 11/22/2015   Procedure: CYSTOSCOPY, RIGHT URETERAL STENT REMOVAL; RIGHT RETROGRADE PYELOGRAM, RIGHT URETEROSCOPY WITH BALLOON DILATION; RIGHT URETERAL STENT PLACEMENT;  Surgeon: Franchot Gallo, MD;  [facility];  Service: Urology;  Laterality: Right;  . CYSTOSCOPY/RETROGRADE/URETEROSCOPY/STONE EXTRACTION WITH BASKET Bilateral 08/02/2015   Procedure: CYSTOSCOPY; BILATERAL RETROGRADE PYELOGRAMS; BILATERAL  URETEROSCOPY, STONE EXTRACTION WITH BASKET;  Surgeon: Franchot Gallo, MD;  [facility];  Service: Urology;  Laterality: Bilateral;  . CYSTOSCOPY/RETROGRADE/URETEROSCOPY/STONE EXTRACTION WITH BASKET Right 06/28/2015   Procedure: CYSTOSCOPY/RETROGRADE/URETEROSCOPY/STONE EXTRACTION WITH BASKET, RIGHT URETERAL DOUBLE J STENT PLACEMENT;  Surgeon: Franchot Gallo, MD;  [facility]: [facility];  Service: Urology;  Laterality: Right;  . ESOPHAGOGASTRODUODENOSCOPY (EGD) WITH PROPOFOL N/A 03/17/2013   Procedure: ESOPHAGOGASTRODUODENOSCOPY (EGD) WITH PROPOFOL;  Surgeon: Danie Binder, MD;  [facility];  Service: Endoscopy;  Laterality: N/A;  . ESOPHAGOGASTRODUODENOSCOPY (EGD) WITH PROPOFOL N/A 06/10/2015   Distal gastritis, distal esophageal stricture s/p dilation  . ESOPHAGOGASTRODUODENOSCOPY (EGD) WITH PROPOFOL N/A 02/26/2017   Procedure: ESOPHAGOGASTRODUODENOSCOPY (EGD) WITH PROPOFOL;  Surgeon: Danie Binder, MD;  [facility];  Service: Endoscopy;  Laterality: N/A;  . FLEXIBLE SIGMOIDOSCOPY  09/11/2011   GBT:[id number] Internal hemorrhoids  . HOLMIUM LASER APPLICATION Left 3/71/0626   Procedure: HOLMIUM LASER APPLICATION;  Surgeon: Franchot Gallo, MD;  [facility];  Service: Urology;  Laterality: Left;  . HOLMIUM LASER APPLICATION Right 9/48/5462   Procedure: HOLMIUM LASER APPLICATION;  Surgeon: Franchot Gallo, MD;  [facility]: [facility];  Service: Urology;  Laterality: Right;  . MASS EXCISION Left 03/04/2015   Procedure: EXCISION OF SOFT TISSUE NEOPLASM LEFT ARM;  Surgeon: Aviva Signs, MD;  Location: [facility];  Service: General;  Laterality: Left;  . PERCUTANEOUS NEPHROSTOLITHOTOMY Bilateral 12/  2011     [facility]  . POLYPECTOMY N/A 03/17/2013   Procedure: GASTRIC POLYPECTOMY;  Surgeon: Danie Binder, MD;  [facility]: [facility];  Service: Endoscopy;  Laterality: N/A;  . POLYPECTOMY  02/26/2017   Procedure: POLYPECTOMY;  Surgeon: Danie Binder, MD;  [facility];  Service: Endoscopy;;  polyp at cecum, ascending colon polyps x3, hepatic flexure polyps x8, transverse colon polyps x8   . REMOVAL RIGHT THIGH CYST  2006  . SAVORY DILATION N/A 03/17/2013   Procedure: SAVORY DILATION;  Surgeon: Danie Binder, MD;  [facility]: [facility];  Service: Endoscopy;  Laterality: N/A;  #12.8, 14, 15, 16 dilators used  . SAVORY DILATION N/A 06/10/2015   Procedure: SAVORY DILATION;  Surgeon: Danie Binder, MD;  [facility];  Service: Endoscopy;  Laterality: N/A;  . TRANSTHORACIC ECHOCARDIOGRAM  01-01-2014   mild LVH/  ef 70-35%/  grade I diastolic dysfunction/  trivial MR, TR, and PR  . VAGINAL HYSTERECTOMY  1970's    There were no vitals filed for this visit.  Subjective Assessment - 07/23/19 1136    Subjective  Pt got her Covid shot and Lt arm is sore.  Reports she has Lt cervical pain out into Lt shoulder and behind shoulder blade, pain scale 5/10.    Pertinent History  myeloma (cancer of blood), HTN, DB, anemic    Patient Stated Goals  reduce pain    Currently  in Pain?  Yes    Pain Score  5     Pain Location  Neck    Pain Orientation  Left    Pain Descriptors / Indicators  Aching    Pain Type  Chronic pain    Pain Radiating Towards  left shoulder    Pain Onset  More than a month ago    Pain Frequency  Intermittent    Aggravating Factors   movement    Pain Relieving Factors  icy hot           [facility] Adult PT Treatment/Exercise - 07/23/19 0001      Exercises   Exercises  Neck      Neck Exercises: Seated   Neck Retraction  10 reps;3 secs    Neck Retraction Limitations  cueing for form    Other Seated Exercise  3D cervical excursion    Other Seated Exercise  scapular retraction      Manual Therapy   Manual Therapy  Soft tissue mobilization;Passive ROM    Manual therapy comments  Manual complete separate than rest of tx    Soft tissue mobilization  Supine: STM to Lt upper trap, periscapular and scalenes    Passive ROM  Gentle:  cervical rotation in supine             PT Education - 07/23/19 1141    Education Details  Reviewed goals, educated importance of HEP compliance for maximal benefits.  Educated importance of posture for pain control.  Established HEP for cervical mobility and postural strengthening.    Person(s) Educated  Patient    Methods  Explanation;Handout    Comprehension  Verbalized understanding;Verbal cues required       PT Short Term Goals - 07/16/19 1550      PT SHORT TERM GOAL #1   Title  Patient will be independent in HEP to improve functional outcomes.    Time  3    Period  Weeks    Status  New    Target Date  08/06/19      PT SHORT TERM GOAL #2   Title  Patient will report at least 25% improvement in overall symptoms to improve QOL.    Time  3    Period  Weeks    Status  New    Target Date  08/06/19        PT Long Term Goals - 07/16/19 1551      PT LONG TERM GOAL #1   Title  Patient will demonstrate at least 60 degrees of cervical rotation in both directions to improve spinal mobility.    Time  6    Period  Weeks    Status  New    Target Date  08/27/19      PT LONG TERM GOAL #2   Title  Patient will report at least 50% improvement in overall symptoms to improve QOL.    Time  6    Period  Days    Status  New    Target Date  08/27/19            Plan - 07/23/19 1232    Clinical Impression Statement  Reviewed goals.  Pt educated on importance of posture for pain control as well as educated on importance of compliance with HEP.  Session focus on cervical mobility with 3D cervical excursion exercise (within pain free range) and posture control.  Pt able to demonstrate good mechanics within pain free range and  given HEP with  exercises able to perform correclty.  Pt required verbal and tactile cueing to improve mechanics with cervical retraction, when able to perform correct form add to HEP.  EOS with manual soft tissue mobilization to address spasm Lt Upper trap  and periscapular mm tightness.  Reports increased relaxation and pain reduced to 2/10 following manual.    Personal Factors and Comorbidities  Age;Comorbidity 1;Comorbidity 2;Comorbidity 3+;Transportation    Comorbidities  DB, Myeloma, HTN    Examination-Activity Limitations  Sleep;Sit;Bathing;Bed Mobility;Hygiene/Grooming;Carry;Squat;Transfers    Examination-Participation Restrictions  Cleaning;Driving;Meal Prep;Shop    Stability/Clinical Decision Making  Evolving/Moderate complexity    Clinical Decision Making  Moderate    Rehab Potential  Poor    PT Frequency  1x / week    PT Duration  6 weeks    PT Treatment/Interventions  ADLs/Self Care Home Management;Cryotherapy;Electrical Stimulation;Moist Heat;Traction;Balance training;Therapeutic exercise;Therapeutic activities;Functional mobility training;Stair training;Gait training;Neuromuscular re-education;Patient/family education;Manual techniques;Passive range of motion    PT Next Visit Plan  cervical mobility, soft tisse mobilization as needed for pain, posture exercises    PT Home Exercise Plan  07/23/2019: 3D cervical excursion and scapular retraction.       Patient will benefit from skilled therapeutic intervention in order to improve the following deficits and impairments:  Pain, Decreased balance, Decreased mobility, Difficulty walking, Decreased range of motion, Decreased activity tolerance  Visit Diagnosis: Cervicalgia  Difficulty in walking, not elsewhere classified     Problem List Patient Active Problem List   Diagnosis Date Noted  . Cervical spondylosis 07/13/2019  . Cervical spondylosis with radiculopathy 07/13/2019  . Canker sore 07/05/2019  . Muscle cramps 07/01/2019  . Depression, major, single episode, in partial remission ([place]) 03/13/2019  . Annual physical exam 03/12/2019  . Eyelid cyst, right 01/15/2019  . 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 Shoulder pain, left 02/28/2018  . Palpable mass of neck  02/28/2018  . Exertional dyspnea 02/28/2018  . Insomnia 11/09/2017  . Multiple myeloma ([place]) 03/15/2017  . Urinary frequency 02/28/2017  . 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 Neuropathic pain of both feet 04/11/2016  . TIA (transient ischemic attack) 02/26/2016  . Lactose intolerance in adult 02/01/2016  . Brain TIA 08/19/2015  . CKD (chronic kidney disease) stage 3, GFR 30-59 ml/min ([facility]) 07/12/2015  . Lipoma of arm 01/19/2015  . Anemia 10/14/2014  . Lumbar back pain with radiculopathy affecting left lower extremity 05/11/2014  . Nocturnal hypoxia 02/04/2014  . Obesity (BMI 30.0-34.9) 01/02/2014  . Generalized anxiety disorder 05/20/2013  . Anxiety and depression 08/13/2012  . Abnormal brain scan 02/07/2012  . Thoracic or lumbosacral neuritis or radiculitis, unspecified 12/22/2010  . Hypercalcemia 11/30/2010  . Colon adenomas 05/11/2009  . HEMORRHOIDS 03/15/2009  . GERD 03/15/2009  . FATTY LIVER DISEASE 03/15/2009  . Sleep apnea 09/15/2008  . 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 Hyperlipidemia LDL goal <100 09/17/2007  . Hypertension goal BP (blood pressure) < 130/80 09/17/2007   Ihor Austin, LPTA; CBIS [phone]  [name] 07/23/2019, 12:59 PM  [facility] [address] [place], [place], [address] Phone: [phone]   Fax:  [phone]  Name: Cheryl Abbott MRN: [id number] Date of Birth: 1946/06/02

## 2019-07-23 NOTE — Patient Instructions (Addendum)
SCAPULA: Retraction   Posture - Sitting    Sit upright, head facing forward. Try using a roll to support lower back. Keep shoulders relaxed, and avoid rounded back. Keep hips level with knees. Avoid crossing legs for long periods.  Copyright  VHI. All rights reserved.     Hold cane with both hands. Pinch shoulder blades together. Do not shrug shoulders. Hold 5 seconds.  10 reps per set, 2 sets per day, 4 days per week  Copyright  VHI. All rights reserved.

## 2019-07-23 NOTE — Progress Notes (Signed)
Subjective:    Patient ID: Cheryl Abbott, female    DOB: 1945-10-04, 74 y.o.   MRN: [id number]  [name], MD  HPI Having bloating. Lost 9 lbs since JAN 2020. WAS SUPPOSE TO HAVE TCS IN SEP 2020 BUT PUT IT OFF DUE TO COVID. WILL HAVE BM 1-2X IN THE AM. APPETITE HASN'T BEEN GREAT. NAUSEA SOMETIME. LIKES TO SLEEP MOST OF THE TIME. GOES TO PT. CAN'T DRIVE. HAD PAIN IN RIGHT SIDE AND CLOSE TO BOTTOM OF STOMACH. CAN BE SHARP. FEELS BETTER WHEN SHE WEARS A GIRDLE. FEELS PULLING ON RIGHT SIDE WITHOUT THE GIRDLE. Problems with sedation: TAKES A LOT TO MAKE HER SLEEPY. USUALLY PASSES GAS AND FEELS BETTER. LOVES CHEESE AND MILK.   PT DENIES FEVER, CHILLS, HEMATOCHEZIA, HEMATEMESIS, vomiting, melena, diarrhea, CHEST PAIN, SHORTNESS OF BREATH, CHANGE IN BOWEL IN HABITS, constipation, problems swallowing,  OR heartburn or indigestion.  Past Medical History:  Diagnosis Date  . Anemia   . Anxiety   . Anxiety disorder   . Arthritis   . Cancer ([place])    acute myeloma  . 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 Chronic back pain   . Depression   . Fatty liver   . GERD (gastroesophageal reflux disease)   . Headache   . History of adenomatous polyp of colon    2008  . History of kidney stones   . Hypercalcemia   . Hyperlipidemia   . Hypertension   . Left ureteral stone   . Lipoma of arm 01/19/2015  . Lumbar disc disease with radiculopathy   . Nephrolithiasis   . Neuropathy, peripheral   . OSA treated with BiPAP    per study 2007  . RENAL CALCULUS 03/15/2009   Qualifier: Diagnosis of  By: Zeb Comfort    . Sciatica   . Shoulder pain, bilateral   . Sigmoid diverticulosis   . Type 2 diabetes mellitus ([place])   . Vaginal dryness, menopausal 10/14/2016   Past Surgical History:  Procedure Laterality Date  . BIOPSY N/A 03/17/2013   Procedure: GASTRIC BIOPSIES;  Surgeon: Danie Binder, MD;  [facility];  Service: Endoscopy;  Laterality: N/A;  . BIOPSY  06/10/2015   Procedure:  BIOPSY;  Surgeon: Danie Binder, MD;  [facility];  Service: Endoscopy;;  gastric biopsy  . CARDIAC CATHETERIZATION  05-11-2003  dr Shelva Majestic ([facility])   Abnormal cardiolite/   Normal coronary arteries and normal LVF,  ef  63%  . CARDIOVASCULAR STRESS TEST  01-01-2014   normal lexiscan cardiolite/  no ischemia/ infarct/  normal LV function and wall motion ,  ef 81%  . COLONOSCOPY  last one 10/02/2011   MOD [place] TICS, IH, NEXT TCS APR 2018  . COLONOSCOPY WITH PROPOFOL N/A 02/26/2017   Procedure: COLONOSCOPY WITH PROPOFOL;  Surgeon: Danie Binder, MD;  [facility];  Service: Endoscopy;  Laterality: N/A;  11:00am  . CYSTO/   RIGHT URETEROSOCOPY LASER LITHOTRIPSY STONE EXTRACTION  10-13-2004  . CYSTO/  RIGHT RETROGRADE PYELOGRAM/  PLACMENT RIGHT URETERAL STENT  01-10-2010  . CYSTOSCOPY W/ URETERAL STENT PLACEMENT Right 08/19/2015   Procedure: CYSTOSCOPY WITH RETROGRADE PYELOGRAM/URETERAL STENT PLACEMENT;  Surgeon: Franchot Gallo, MD;  [facility];  Service: Urology;  Laterality: Right;  . CYSTOSCOPY WITH RETROGRADE PYELOGRAM, URETEROSCOPY AND STENT PLACEMENT Left 10/21/2014   Procedure: CYSTOSCOPY WITH RETROGRADE PYELOGRAM, URETEROSCOPY AND STENT PLACEMENT;  Surgeon: Franchot Gallo, MD;  [facility];  Service:  Urology;  Laterality: Left;  . CYSTOSCOPY WITH RETROGRADE PYELOGRAM, URETEROSCOPY AND STENT PLACEMENT Right 11/22/2015   Procedure: CYSTOSCOPY, RIGHT URETERAL STENT REMOVAL; RIGHT RETROGRADE PYELOGRAM, RIGHT URETEROSCOPY WITH BALLOON DILATION; RIGHT URETERAL STENT PLACEMENT;  Surgeon: Franchot Gallo, MD;  [facility]: [facility];  Service: Urology;  Laterality: Right;  . CYSTOSCOPY/RETROGRADE/URETEROSCOPY/STONE EXTRACTION WITH BASKET Bilateral 08/02/2015   Procedure: CYSTOSCOPY; BILATERAL RETROGRADE PYELOGRAMS; BILATERAL URETEROSCOPY, STONE EXTRACTION WITH BASKET;  Surgeon: Franchot Gallo, MD;  Location: [facility];  Service:  Urology;  Laterality: Bilateral;  . CYSTOSCOPY/RETROGRADE/URETEROSCOPY/STONE EXTRACTION WITH BASKET Right 06/28/2015   Procedure: CYSTOSCOPY/RETROGRADE/URETEROSCOPY/STONE EXTRACTION WITH BASKET, RIGHT URETERAL DOUBLE J STENT PLACEMENT;  Surgeon: Franchot Gallo, MD;  Location: [facility];  Service: Urology;  Laterality: Right;  . ESOPHAGOGASTRODUODENOSCOPY (EGD) WITH PROPOFOL N/A 03/17/2013   Procedure: ESOPHAGOGASTRODUODENOSCOPY (EGD) WITH PROPOFOL;  Surgeon: Danie Binder, MD;  Location: [facility];  Service: Endoscopy;  Laterality: N/A;  . ESOPHAGOGASTRODUODENOSCOPY (EGD) WITH PROPOFOL N/A 06/10/2015   Distal gastritis, distal esophageal stricture s/p dilation  . ESOPHAGOGASTRODUODENOSCOPY (EGD) WITH PROPOFOL N/A 02/26/2017   Procedure: ESOPHAGOGASTRODUODENOSCOPY (EGD) WITH PROPOFOL;  Surgeon: Danie Binder, MD;  [facility];  Service: Endoscopy;  Laterality: N/A;  . FLEXIBLE SIGMOIDOSCOPY  09/11/2011   [id number] Internal hemorrhoids  . HOLMIUM LASER APPLICATION Left 8/65/7846   Procedure: HOLMIUM LASER APPLICATION;  Surgeon: Franchot Gallo, MD;  Location[facility];  Service: Urology;  Laterality: Left;  . HOLMIUM LASER APPLICATION Right 9/62/9528   Procedure: HOLMIUM LASER APPLICATION;  Surgeon: Franchot Gallo, MD;  [facility];  Service: Urology;  Laterality: Right;  . MASS EXCISION Left 03/04/2015   Procedure: EXCISION OF SOFT TISSUE NEOPLASM LEFT ARM;  Surgeon: Aviva Signs, MD;  Location: [facility];  Service: General;  Laterality: Left;  . PERCUTANEOUS NEPHROSTOLITHOTOMY Bilateral 12/  2011     [facility]  . POLYPECTOMY N/A 03/17/2013   Procedure: GASTRIC POLYPECTOMY;  Surgeon: Danie Binder, MD;  [facility];  Service: Endoscopy;  Laterality: N/A;  . POLYPECTOMY  02/26/2017   Procedure: POLYPECTOMY;  Surgeon: Danie Binder, MD;  [facility];  Service: Endoscopy;;  polyp at cecum, ascending colon polyps x3, hepatic flexure polyps x8,  transverse colon polyps x8   . REMOVAL RIGHT THIGH CYST  2006  . SAVORY DILATION N/A 03/17/2013   Procedure: SAVORY DILATION;  Surgeon: Danie Binder, MD;  [facility];  Service: Endoscopy;  Laterality: N/A;  #12.8, 14, 15, 16 dilators used  . SAVORY DILATION N/A 06/10/2015   Procedure: SAVORY DILATION;  Surgeon: Danie Binder, MD;  [facility];  Service: Endoscopy;  Laterality: N/A;  . TRANSTHORACIC ECHOCARDIOGRAM  01-01-2014   mild LVH/  ef 41-32%/  grade I diastolic dysfunction/  trivial MR, TR, and PR  . VAGINAL HYSTERECTOMY  1970's    Allergies  Allergen Reactions  . Ace Inhibitors Cough  . Keflex [Cephalexin] Diarrhea and Nausea And Vomiting  . Nitrofurantoin Diarrhea and Nausea And Vomiting  . Penicillins Hives and Other (See Comments)    Reaction:  Blisters on hands/feet     Current Outpatient Medications  Medication Sig    . amLODipine (NORVASC) 10 MG tablet TAKE 1 TABLET BY MOUTH EVERY DAY    . aspirin EC 325 MG tablet Take 81 mg by mouth daily. Takes 81 mg daily    . budesonide-formoterol (SYMBICORT) 80-4.5 MCG/ACT inhaler INHALE 2 PUFFS INTO THE LUNGS TWICE A DAY    . calcium-vitamin D (OSCAL WITH  D) 500-200 MG-UNIT tablet Take 1 tablet by mouth daily.    . diclofenac sodium (VOLTAREN) 1 % GEL Apply 4 g topically 4 (four) times daily.    Marland Kitchen lovastatin (MEVACOR) 40 MG tablet TAKE 1 TABLET AT BEDTIME    . Melatonin 10 MG TABS Take 10 mg by mouth as needed.     . pantoprazole (PROTONIX) 40 MG tablet TAKE 1 TABLET (40 MG TOTAL) BY MOUTH 2 (TWO) TIMES DAILY.    Marland Kitchen PARoxetine (PAXIL) 40 MG tablet TAKE 1 TABLET BY MOUTH EVERY DAY IN THE MORNING    . TRADJENTA 5 MG TABS tablet TAKE 1 TABLET BY MOUTH EVERY DAY    .      Marland Kitchen KLOR-CON M20 20 MEQ tablet Take 20 mEq by mouth daily.    .      .      . TOBRADEX ophthalmic ointment Place 1 application into the right eye 2 (two) times daily.    . traZODone (DESYREL) 100 MG tablet Take 100 mg by mouth at bedtime.      Review of Systems PER HPI OTHERWISE ALL SYSTEMS ARE NEGATIVE.    Objective:   Physical Exam Constitutional:      General: She is not in acute distress.    Appearance: Normal appearance.  HENT:     Mouth/Throat:     Comments: MASK IN PLACE Eyes:     General: No scleral icterus.    Pupils: Pupils are equal, round, and reactive to light.  Cardiovascular:     Rate and Rhythm: Normal rate and regular rhythm.     Pulses: Normal pulses.     Heart sounds: Normal heart sounds.  Pulmonary:     Effort: Pulmonary effort is normal.     Breath sounds: Normal breath sounds.  Abdominal:     General: Bowel sounds are normal.     Palpations: Abdomen is soft.     Tenderness: There is no abdominal tenderness.  Musculoskeletal:     Cervical back: Normal range of motion.     Right lower leg: No edema.     Left lower leg: No edema.  Lymphadenopathy:     Cervical: No cervical adenopathy.  Skin:    General: Skin is warm and dry.  Neurological:     Mental Status: She is alert and oriented to person, place, and time.     Comments: NO  NEW FOCAL DEFICITS  Psychiatric:        Mood and Affect: Mood normal.     Comments: NORMAL AFFECT       Assessment & Plan:

## 2019-07-23 NOTE — Patient Instructions (Addendum)
TO REDUCE BLOATING:    1. AVOID CREAMER AND DAIRY. IF YOU CONSUME DAIRY, ADD LACTASE 3 PILLS WITH MEALS UP TO THREE TIMES A DAY.     2. TAKE CIPRO & FLAGYL TWICE DAILY FOR 5 DAYS. MEDICATION SIDE EFFECTS INCLUDE HEEL PAIN, NAUSEA, VOMITING. AVOID ALCOHOL AND COUGH SYRUP WITH ALCOHOL.    CONTINUE PROTONIX. TAKE 30 MINUTES PRIOR TO MEALS TWICE DAILY.   COMPLETE COLONOSCOPY. HOLD TRADJENTA ON DAY OF COLONOSCOPY.  FOLLOW UP IN 1 YEAR.

## 2019-07-23 NOTE — Progress Notes (Signed)
CC'ED TO PCP 

## 2019-07-23 NOTE — Assessment & Plan Note (Signed)
PAST DUE FOR TCS. NO BRBPR OR MELENA.  COMPLETE COLONOSCOPY.  DISCUSSED PROCEDURE, BENEFITS, & RISKS: < 1% chance of medication reaction, bleeding, perforation, ASPIRATION, or rupture of spleen/liver requiring surgery to fix it and missed polyps < 1 cm 10-20% of the time. FOLLOW UP IN 1 YEAR.

## 2019-07-23 NOTE — Assessment & Plan Note (Signed)
SYMPTOMS FAIRLY WELL CONTROLLED.  CONTINUE PROTONIX. TAKE 30 MINUTES PRIOR TO MEALS TWICE DAILY. FOLLOW UP IN 1 YEAR.

## 2019-07-24 ENCOUNTER — Encounter: Payer: Self-pay | Admitting: *Deleted

## 2019-07-27 ENCOUNTER — Ambulatory Visit (HOSPITAL_COMMUNITY): Payer: Medicare Other | Admitting: Physical Therapy

## 2019-07-28 IMAGING — DX DG BONE SURVEY MET
10 series · 10 of 10 positions shown · non-contrast
Comparison: CT scan of August 19, 2015.

CLINICAL DATA: Monoclonal paraproteinemia.

EXAM:
METASTATIC BONE SURVEY

[skull lat]
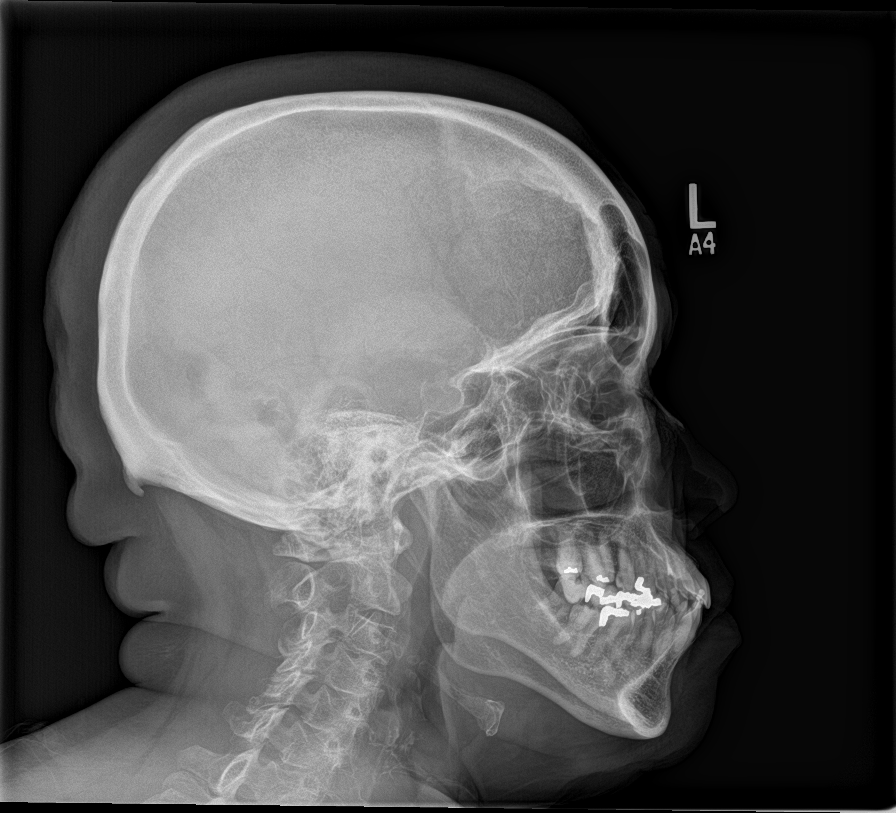

[shoulder ap (1 of 2)]
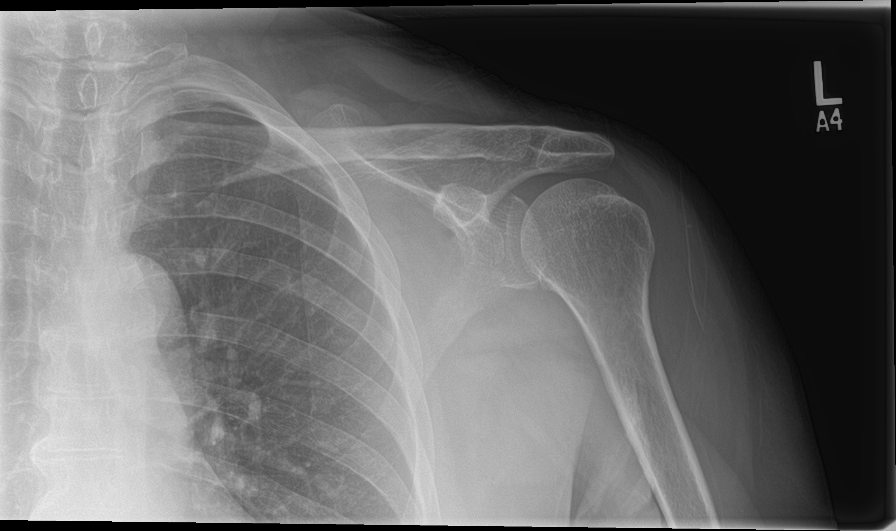

[shoulder ap (2 of 2)]
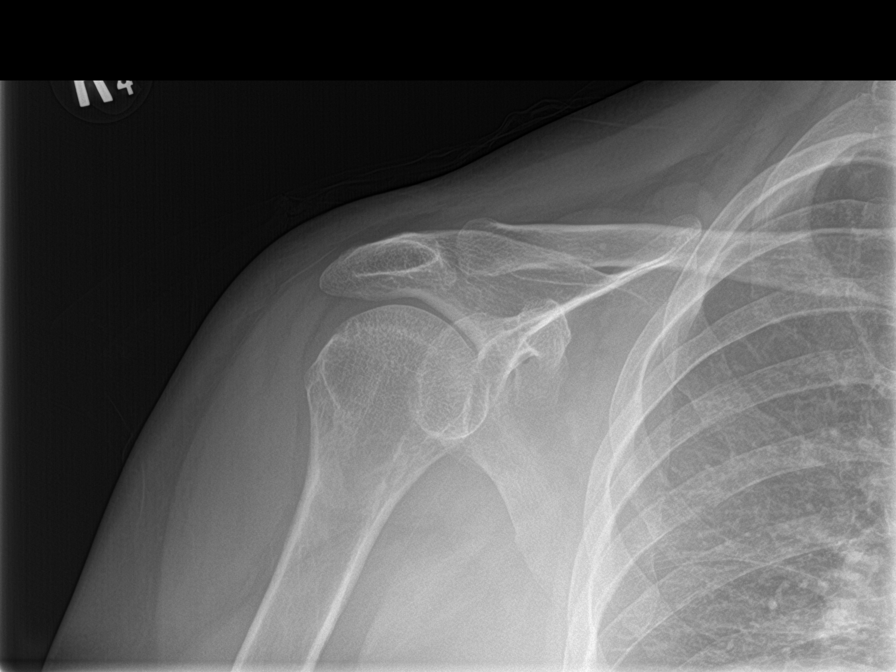

[humerus ap (1 of 2)]
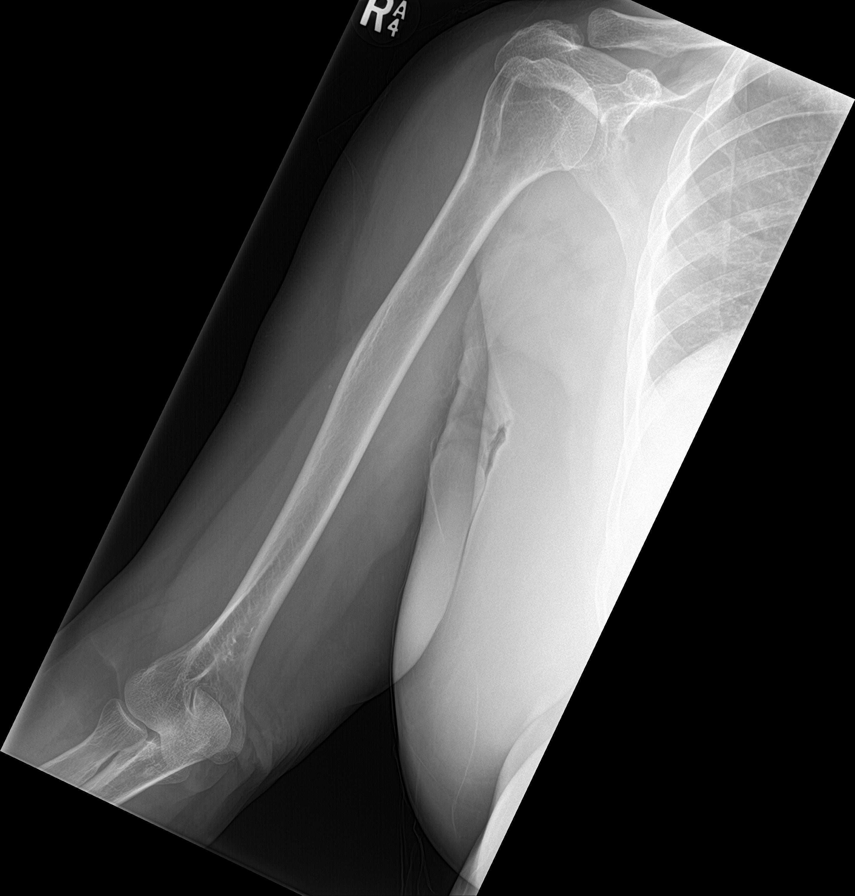

[humerus ap (2 of 2)]
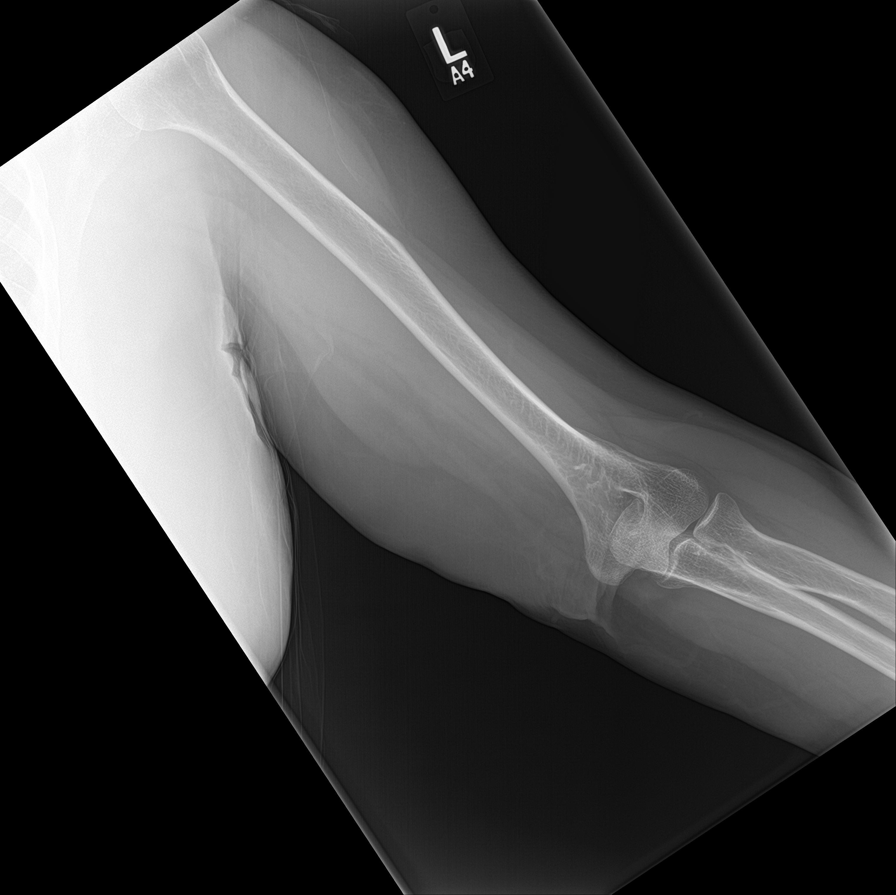

[forearm ap (1 of 2)]
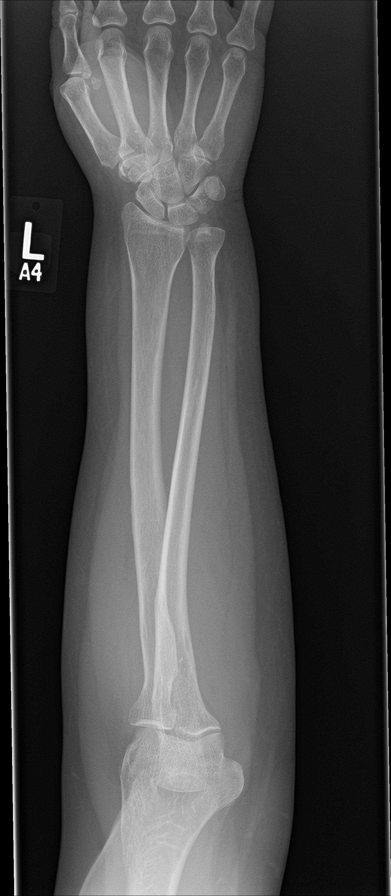

[forearm ap (2 of 2)]
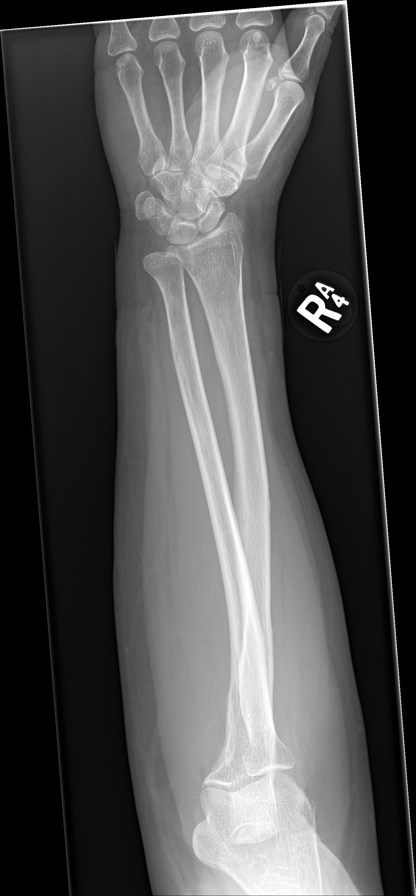

[c-spine ap]
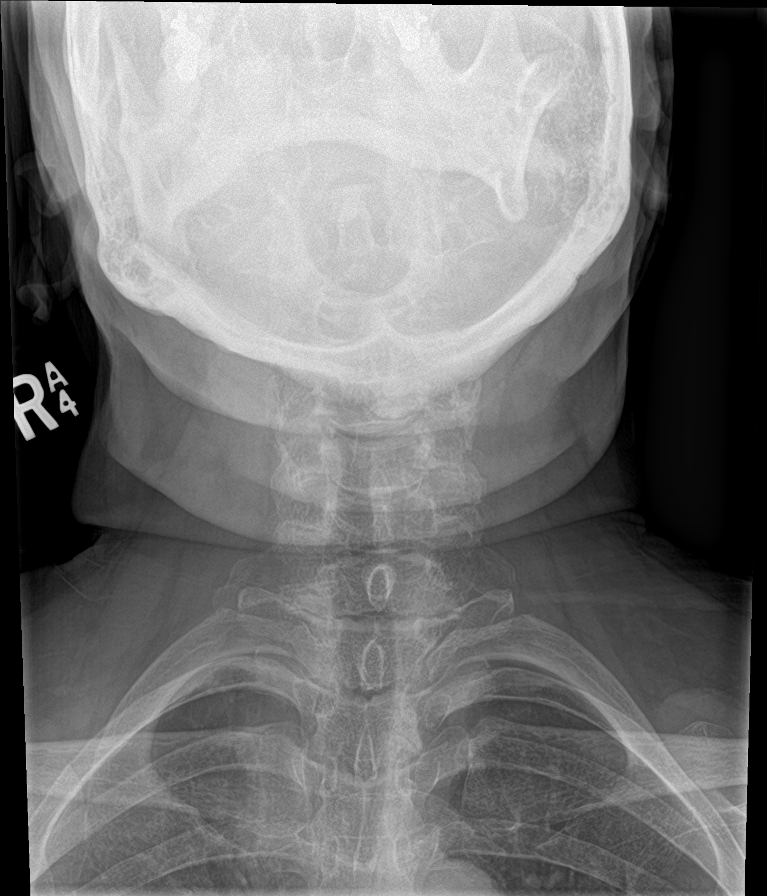

[c-spine lat]
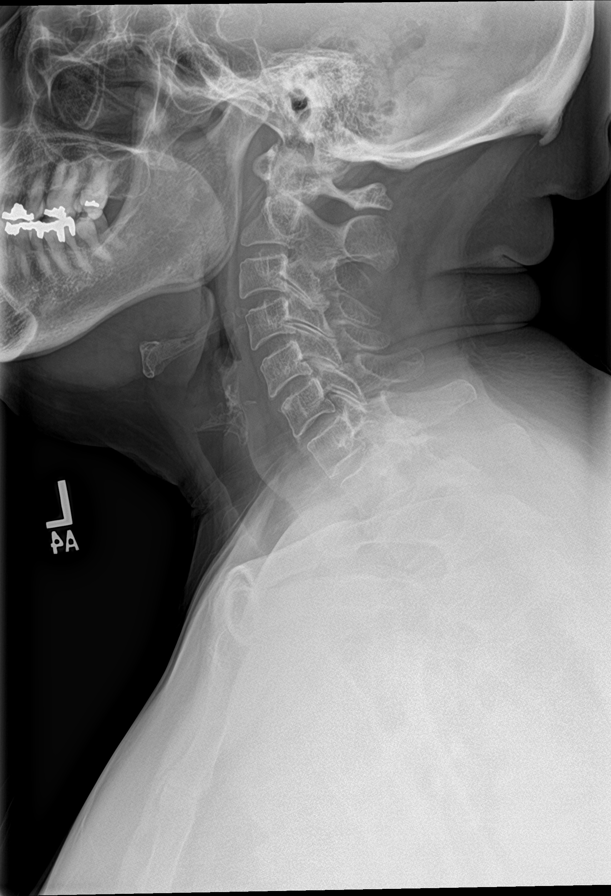

[t-spine lat]
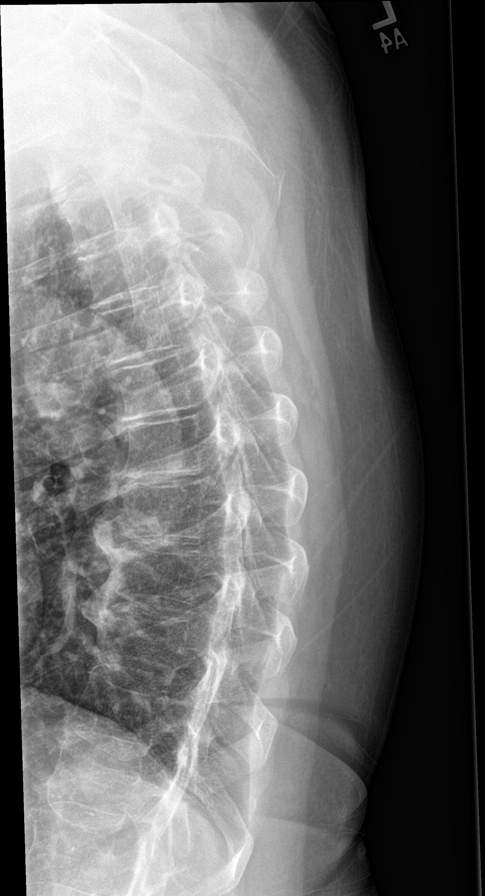

[10 of 10 positions shown; findings below may reference images not displayed]

FINDINGS: There is no definite evidence of lytic lesion or other significant
osseous abnormality involving the skull, spine, extremities, pelvis
or ribcage.
IMPRESSION: No definite lytic lesion seen in the visualized skeleton.

## 2019-08-01 IMAGING — CT CT BIOPSY
1 of 2 series · 11 of 16 positions shown, 14 images · non-contrast
Comparison: none

INDICATION: Monoclonal paraproteinemia. Please perform CT-guided bone marrow
biopsy for tissue diagnostic purposes.

[Series 2: i-spiral 5.0 b40f · axial · 0.88mm/px · z∈[-136,-41]mm · 11 of 33 slices shown, 14 images]
[im 3/33  soft-tissue]
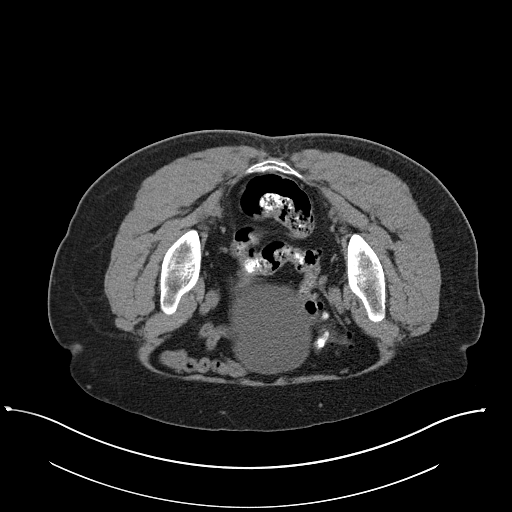
[im 3/33  bone]
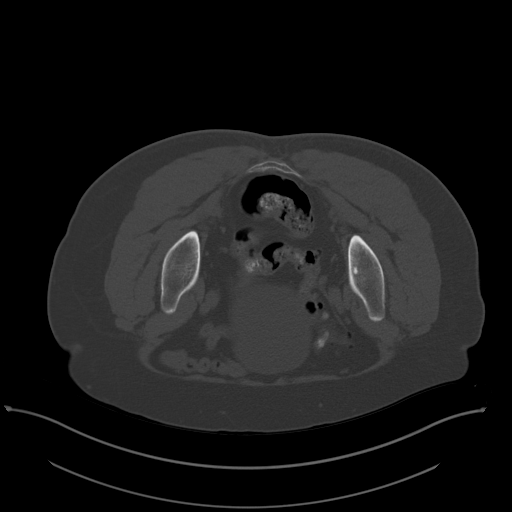
[im 6/33  soft-tissue]
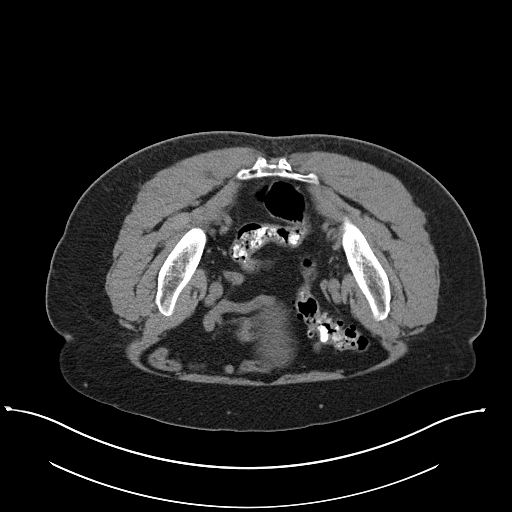
[im 9/33  soft-tissue]
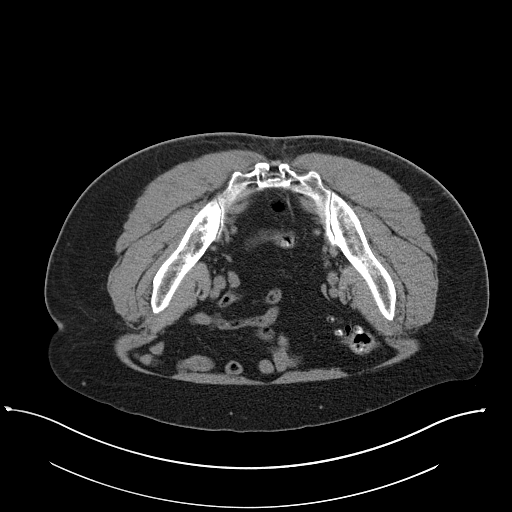
[im 11/33  soft-tissue]
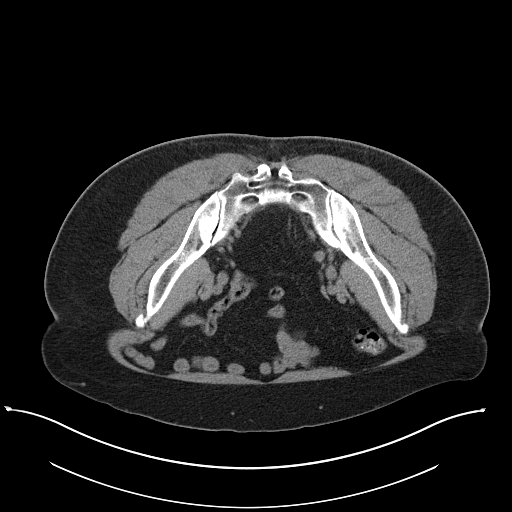
[im 14/33  soft-tissue]
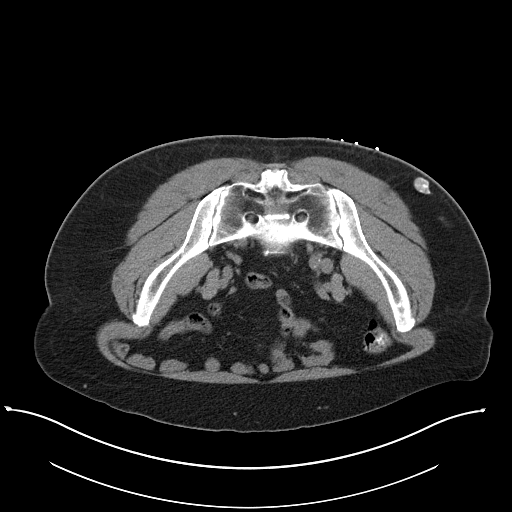
[im 14/33  bone]
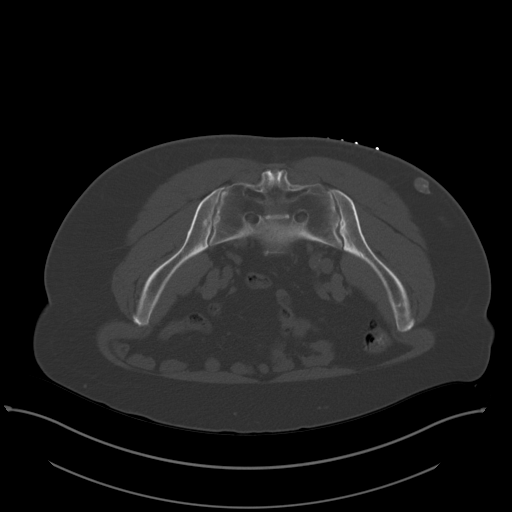
[im 17/33  soft-tissue]
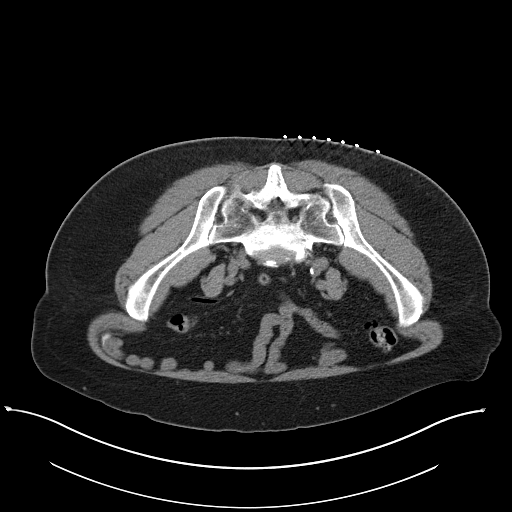
[im 19/33  soft-tissue]
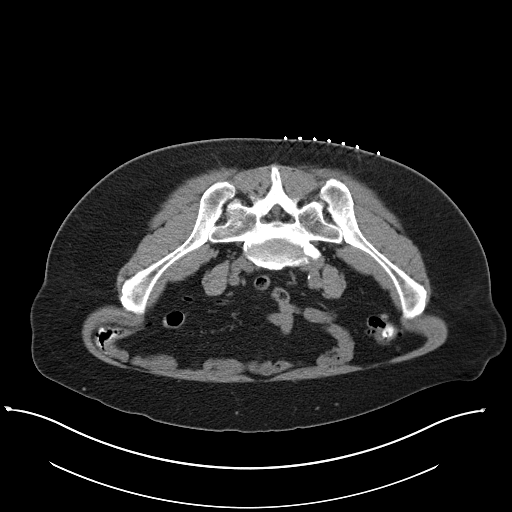
[im 22/33  soft-tissue]
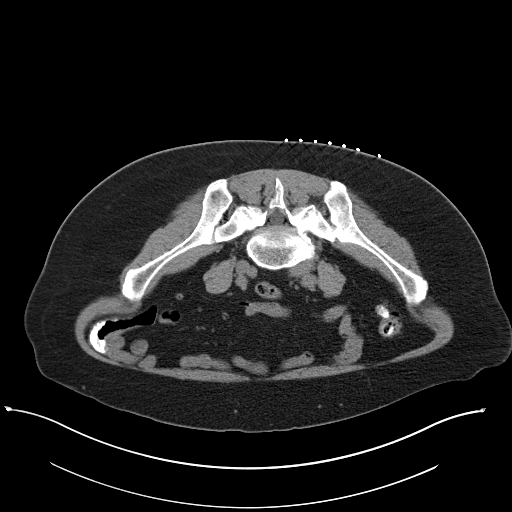
[im 25/33  soft-tissue]
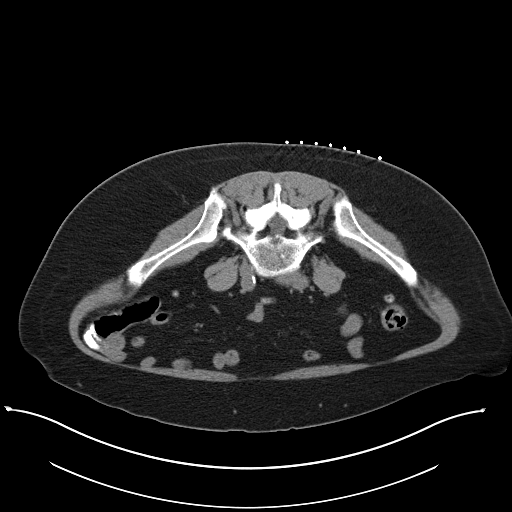
[im 25/33  bone]
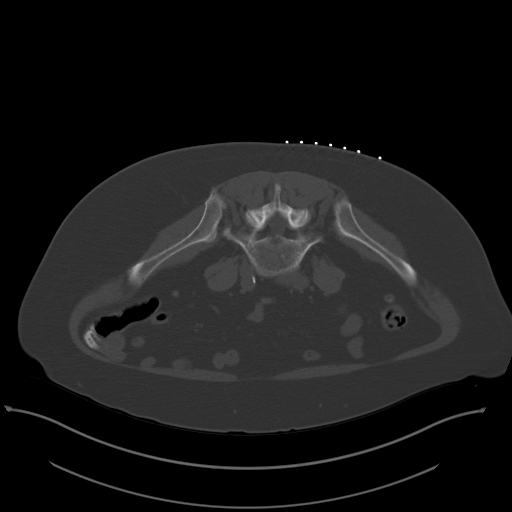
[im 27/33  soft-tissue]
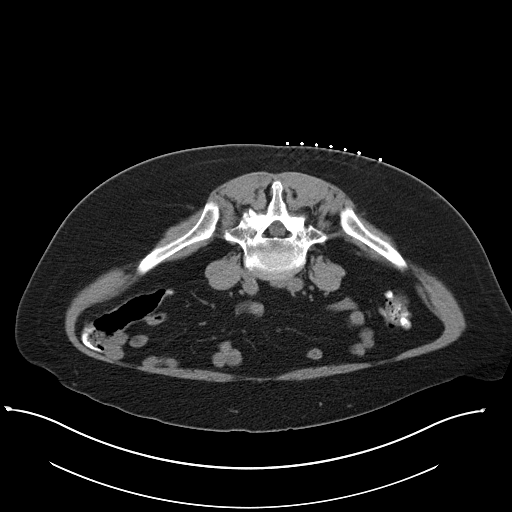
[im 30/33  soft-tissue]
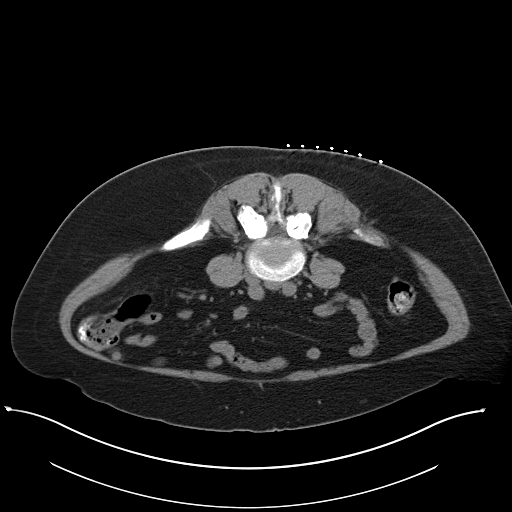

[11 of 16 positions shown; findings below may reference images not displayed]

EXAM:
CT-GUIDED BONE MARROW BIOPSY AND ASPIRATION

MEDICATIONS:
None

ANESTHESIA/SEDATION:
Fentanyl 200 mcg IV; Versed 4 mg IV

Sedation Time: 11 minutes; The patient was continuously monitored
during the procedure by the interventional radiology nurse under my
direct supervision.

COMPLICATIONS:
None immediate.

PROCEDURE:
Informed consent was obtained from the patient following an
explanation of the procedure, risks, benefits and alternatives. The
patient understands, agrees and consents for the procedure. All
questions were addressed. A time out was performed prior to the
initiation of the procedure. The patient was positioned prone and
non-contrast localization CT was performed of the pelvis to
demonstrate the iliac marrow spaces. The operative site was prepped
and draped in the usual sterile fashion.

Under sterile conditions and local anesthesia, a 22 gauge spinal
needle was utilized for procedural planning. Next, an 11 gauge
coaxial bone biopsy needle was advanced into the left iliac marrow
space. Needle position was confirmed with CT imaging. Initially,
bone marrow aspiration was performed. Next, a bone marrow biopsy was
obtained with the 11 gauge outer bone marrow device. Samples were
prepared with the cytotechnologist and deemed adequate. The needle
was removed intact. Hemostasis was obtained with compression and a
dressing was placed. The patient tolerated the procedure well
without immediate post procedural complication.
IMPRESSION: Successful CT guided left iliac bone marrow aspiration and core
biopsy.

## 2019-08-03 ENCOUNTER — Other Ambulatory Visit: Payer: Self-pay | Admitting: Family Medicine

## 2019-08-04 ENCOUNTER — Other Ambulatory Visit: Payer: Self-pay

## 2019-08-04 ENCOUNTER — Ambulatory Visit (HOSPITAL_COMMUNITY): Payer: Medicare Other | Admitting: Physical Therapy

## 2019-08-04 DIAGNOSIS — M542 Cervicalgia: Secondary | ICD-10-CM

## 2019-08-04 DIAGNOSIS — R262 Difficulty in walking, not elsewhere classified: Secondary | ICD-10-CM | POA: Diagnosis not present

## 2019-08-04 NOTE — Therapy (Signed)
[place], [place], [address] Phone: [phone]   Fax:  [phone]  Physical Therapy Treatment  Patient Details  Name: Cheryl Abbott MRN: [id number] Date of Birth: 04/27/1946 Referring Provider (PT): Tula Nakayama   Encounter Date: 08/04/2019  PT End of Session - 08/04/19 1516    Visit Number  3    Number of Visits  6    Date for PT Re-Evaluation  08/27/19    Authorization Type  [employer] medicare    Authorization Time Period  no auth required. POC dates 07/16/19 to 08/27/2019    Authorization - Visit Number  3    Authorization - Number of Visits  6    PT Start Time  6967    PT Stop Time  1450    PT Time Calculation (min)  45 min    Activity Tolerance  Patient limited by pain;Patient limited by fatigue;No increased pain   Reports pain reduced to 23/10 at EOS, was 5/10 at beginning of session   Behavior During Therapy  [facility] for tasks assessed/performed       Past Medical History:  Diagnosis Date  . Anemia   . Anxiety   . Anxiety disorder   . Arthritis   . Cancer ([place])    acute myeloma  . 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 Chronic back pain   . Depression   . Fatty liver   . GERD (gastroesophageal reflux disease)   . Headache   . History of adenomatous polyp of colon    2008  . History of kidney stones   . Hypercalcemia   . Hyperlipidemia   . Hypertension   . Left ureteral stone   . Lipoma of arm 01/19/2015  . Lumbar disc disease with radiculopathy   . Nephrolithiasis   . Neuropathy, peripheral   . OSA treated with BiPAP    per study 2007  . RENAL CALCULUS 03/15/2009   Qualifier: Diagnosis of  By: Zeb Comfort    . Sciatica   . Shoulder pain, bilateral   . Sigmoid diverticulosis   . Type 2 diabetes mellitus ([place])   . Vaginal dryness, menopausal 10/14/2016    Past Surgical History:  Procedure Laterality Date  . BIOPSY N/A 03/17/2013   Procedure: GASTRIC BIOPSIES;  Surgeon: Danie Binder, MD;  Location: [facility];  Service: Endoscopy;  Laterality: N/A;  . BIOPSY  06/10/2015   Procedure: BIOPSY;  Surgeon: Danie Binder, MD;  [facility];  Service: Endoscopy;;  gastric biopsy  . CARDIAC CATHETERIZATION  05-11-2003  dr Shelva Majestic ([facility])   Abnormal cardiolite/   Normal coronary arteries and normal LVF,  ef  63%  . CARDIOVASCULAR STRESS TEST  01-01-2014   normal lexiscan cardiolite/  no ischemia/ infarct/  normal LV function and wall motion ,  ef 81%  . COLONOSCOPY  last one 10/02/2011   MOD [place] TICS, IH, NEXT TCS APR 2018  . COLONOSCOPY WITH PROPOFOL N/A 02/26/2017   Procedure: COLONOSCOPY WITH PROPOFOL;  Surgeon: Danie Binder, MD;  [facility];  Service: Endoscopy;  Laterality: N/A;  11:00am  . CYSTO/   RIGHT URETEROSOCOPY LASER LITHOTRIPSY STONE EXTRACTION  10-13-2004  . CYSTO/  RIGHT RETROGRADE PYELOGRAM/  PLACMENT RIGHT URETERAL STENT  01-10-2010  . CYSTOSCOPY W/ URETERAL STENT PLACEMENT Right 08/19/2015   Procedure: CYSTOSCOPY WITH RETROGRADE PYELOGRAM/URETERAL STENT PLACEMENT;  Surgeon: Franchot Gallo, MD;  Location: [facility];  Service:  Urology;  Laterality: Right;  . CYSTOSCOPY WITH RETROGRADE PYELOGRAM, URETEROSCOPY AND STENT PLACEMENT Left 10/21/2014   Procedure: CYSTOSCOPY WITH RETROGRADE PYELOGRAM, URETEROSCOPY AND STENT PLACEMENT;  Surgeon: Franchot Gallo, MD;  [facility];  Service: Urology;  Laterality: Left;  . CYSTOSCOPY WITH RETROGRADE PYELOGRAM, URETEROSCOPY AND STENT PLACEMENT Right 11/22/2015   Procedure: CYSTOSCOPY, RIGHT URETERAL STENT REMOVAL; RIGHT RETROGRADE PYELOGRAM, RIGHT URETEROSCOPY WITH BALLOON DILATION; RIGHT URETERAL STENT PLACEMENT;  Surgeon: Franchot Gallo, MD;  Location: [facility];  Service: Urology;  Laterality: Right;  . CYSTOSCOPY/RETROGRADE/URETEROSCOPY/STONE EXTRACTION WITH BASKET Bilateral 08/02/2015   Procedure: CYSTOSCOPY; BILATERAL RETROGRADE PYELOGRAMS; BILATERAL  URETEROSCOPY, STONE EXTRACTION WITH BASKET;  Surgeon: Franchot Gallo, MD;  Location: [facility];  Service: Urology;  Laterality: Bilateral;  . CYSTOSCOPY/RETROGRADE/URETEROSCOPY/STONE EXTRACTION WITH BASKET Right 06/28/2015   Procedure: CYSTOSCOPY/RETROGRADE/URETEROSCOPY/STONE EXTRACTION WITH BASKET, RIGHT URETERAL DOUBLE J STENT PLACEMENT;  Surgeon: Franchot Gallo, MD;  Location: [facility];  Service: Urology;  Laterality: Right;  . ESOPHAGOGASTRODUODENOSCOPY (EGD) WITH PROPOFOL N/A 03/17/2013   Procedure: ESOPHAGOGASTRODUODENOSCOPY (EGD) WITH PROPOFOL;  Surgeon: Danie Binder, MD;  [facility];  Service: Endoscopy;  Laterality: N/A;  . ESOPHAGOGASTRODUODENOSCOPY (EGD) WITH PROPOFOL N/A 06/10/2015   Distal gastritis, distal esophageal stricture s/p dilation  . ESOPHAGOGASTRODUODENOSCOPY (EGD) WITH PROPOFOL N/A 02/26/2017   Procedure: ESOPHAGOGASTRODUODENOSCOPY (EGD) WITH PROPOFOL;  Surgeon: Danie Binder, MD;  [facility];  Service: Endoscopy;  Laterality: N/A;  . FLEXIBLE SIGMOIDOSCOPY  09/11/2011   [id number] Internal hemorrhoids  . HOLMIUM LASER APPLICATION Left 06/11/7508   Procedure: HOLMIUM LASER APPLICATION;  Surgeon: Franchot Gallo, MD;  Location[facility];  Service: Urology;  Laterality: Left;  . HOLMIUM LASER APPLICATION Right 2/58/5277   Procedure: HOLMIUM LASER APPLICATION;  Surgeon: Franchot Gallo, MD;  Location: [facility];  Service: Urology;  Laterality: Right;  . MASS EXCISION Left 03/04/2015   Procedure: EXCISION OF SOFT TISSUE NEOPLASM LEFT ARM;  Surgeon: Aviva Signs, MD;  Location: [facility];  Service: General;  Laterality: Left;  . PERCUTANEOUS NEPHROSTOLITHOTOMY Bilateral 12/  2011     [facility]  . POLYPECTOMY N/A 03/17/2013   Procedure: GASTRIC POLYPECTOMY;  Surgeon: Danie Binder, MD;  Location: [facility];  Service: Endoscopy;  Laterality: N/A;  . POLYPECTOMY  02/26/2017   Procedure: POLYPECTOMY;  Surgeon: Danie Binder, MD;  [facility];  Service: Endoscopy;;  polyp at cecum, ascending colon polyps x3, hepatic flexure polyps x8, transverse colon polyps x8   . REMOVAL RIGHT THIGH CYST  2006  . SAVORY DILATION N/A 03/17/2013   Procedure: SAVORY DILATION;  Surgeon: Danie Binder, MD;  Location: [facility];  Service: Endoscopy;  Laterality: N/A;  #12.8, 14, 15, 16 dilators used  . SAVORY DILATION N/A 06/10/2015   Procedure: SAVORY DILATION;  Surgeon: Danie Binder, MD;  [facility];  Service: Endoscopy;  Laterality: N/A;  . TRANSTHORACIC ECHOCARDIOGRAM  01-01-2014   mild LVH/  ef 82-42%/  grade I diastolic dysfunction/  trivial MR, TR, and PR  . VAGINAL HYSTERECTOMY  1970's    There were no vitals filed for this visit.  Subjective Assessment - 08/04/19 1408    Subjective  pt reports her Lt side of her neck is still hurting at 7/10.    Currently in Pain?  Yes    Pain Score  7     Pain Location  Neck    Pain Orientation  Left    Pain Descriptors / Indicators  Aching;Squeezing  [facility] Adult PT Treatment/Exercise - 08/04/19 0001      Neck Exercises: Seated   Other Seated Exercise  3D cervical excursion    Other Seated Exercise  scapular retraction      Manual Therapy   Manual Therapy  Soft tissue mobilization;Passive ROM    Manual therapy comments  Manual complete separate than rest of tx    Soft tissue mobilization  Seated: STM to Lt upper trap, periscapular and scalenes               PT Short Term Goals - 07/16/19 1550      PT SHORT TERM GOAL #1   Title  Patient will be independent in HEP to improve functional outcomes.    Time  3    Period  Weeks    Status  New    Target Date  08/06/19      PT SHORT TERM GOAL #2   Title  Patient will report at least 25% improvement in overall symptoms to improve QOL.    Time  3    Period  Weeks    Status  New    Target Date  08/06/19        PT Long Term Goals - 07/16/19 1551      PT LONG TERM GOAL #1    Title  Patient will demonstrate at least 60 degrees of cervical rotation in both directions to improve spinal mobility.    Time  6    Period  Weeks    Status  New    Target Date  08/27/19      PT LONG TERM GOAL #2   Title  Patient will report at least 50% improvement in overall symptoms to improve QOL.    Time  6    Period  Days    Status  New    Target Date  08/27/19            Plan - 08/04/19 1517    Clinical Impression Statement  pt returns today with essentially no change since beginning therapy.  Reports compliance with HEP and able to complete full cervical ROM with therex without c/o pain. Attempted Thoracic excursions this session, however painful Rt UE flexion with movement, unable to lift greater than 30 degrees.  Max cues with posturing with inablitly to maintain upright posturing with therex.  Pt with very slow, intentional gait with shuffling.  Continued with manual to Lt UT with hypersensitivity to touch and inablity to complete deeper soft tissue manipulation as reactive.  pt frequently rotated head to full ROM during session to look out the door of treatment room at passerbys. frequent redirection to focus to task. Pt verbalized no change in pain at end of session.    Personal Factors and Comorbidities  Age;Comorbidity 1;Comorbidity 2;Comorbidity 3+;Transportation    Comorbidities  DB, Myeloma, HTN    Examination-Activity Limitations  Sleep;Sit;Bathing;Bed Mobility;Hygiene/Grooming;Carry;Squat;Transfers    Examination-Participation Restrictions  Cleaning;Driving;Meal Prep;Shop    Stability/Clinical Decision Making  Evolving/Moderate complexity    Rehab Potential  Poor    PT Frequency  1x / week    PT Duration  6 weeks    PT Treatment/Interventions  ADLs/Self Care Home Management;Cryotherapy;Electrical Stimulation;Moist Heat;Traction;Balance training;Therapeutic exercise;Therapeutic activities;Functional mobility training;Stair training;Gait training;Neuromuscular  re-education;Patient/family education;Manual techniques;Passive range of motion    PT Next Visit Plan  cervical mobility, soft tisse mobilization as needed for pain, posture exercises.  Next session progress with throacic excursions without UE movement and postural strengthening.  PT Home Exercise Plan  07/23/2019: 3D cervical excursion and scapular retraction.       Patient will benefit from skilled therapeutic intervention in order to improve the following deficits and impairments:  Pain, Decreased balance, Decreased mobility, Difficulty walking, Decreased range of motion, Decreased activity tolerance  Visit Diagnosis: Cervicalgia     Problem List Patient Active Problem List   Diagnosis Date Noted  . Cervical spondylosis 07/13/2019  . Cervical spondylosis with radiculopathy 07/13/2019  . Canker sore 07/05/2019  . Muscle cramps 07/01/2019  . Depression, major, single episode, in partial remission ([place]) 03/13/2019  . Annual physical exam 03/12/2019  . Eyelid cyst, right 01/15/2019  . 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 Shoulder pain, left 02/28/2018  . Palpable mass of neck 02/28/2018  . Exertional dyspnea 02/28/2018  . Insomnia 11/09/2017  . Multiple myeloma ([place]) 03/15/2017  . Urinary frequency 02/28/2017  . 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 Neuropathic pain of both feet 04/11/2016  . TIA (transient ischemic attack) 02/26/2016  . Lactose intolerance in adult 02/01/2016  . Brain TIA 08/19/2015  . CKD (chronic kidney disease) stage 3, GFR 30-59 ml/min ([facility]) 07/12/2015  . Lipoma of arm 01/19/2015  . Anemia 10/14/2014  . Lumbar back pain with radiculopathy affecting left lower extremity 05/11/2014  . Nocturnal hypoxia 02/04/2014  . Obesity (BMI 30.0-34.9) 01/02/2014  . Generalized anxiety disorder 05/20/2013  . Anxiety and depression 08/13/2012  . Abnormal brain scan 02/07/2012  . Thoracic or lumbosacral neuritis or radiculitis, unspecified 12/22/2010  .  Hypercalcemia 11/30/2010  . Abdominal bloating 09/08/2009  . Colon adenomas 05/11/2009  . HEMORRHOIDS 03/15/2009  . GERD 03/15/2009  . FATTY LIVER DISEASE 03/15/2009  . Sleep apnea 09/15/2008  . 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 Hyperlipidemia LDL goal <100 09/17/2007  . Hypertension goal BP (blood pressure) < 130/80 09/17/2007   Teena Irani, PTA/CLT [phone]  Teena Irani 08/04/2019, 3:23 PM  [place], [place], [address] Phone: [phone]   Fax:  [phone]  Name: MEAGHANN CHOO MRN: [id number] Date of Birth: 1946/06/03

## 2019-08-08 IMAGING — DX DG CHEST 2V
2 series · 2 of 2 positions shown · non-contrast
Comparison: 03/07/2016

CLINICAL DATA: Weakness, shortness of breath

EXAM:
CHEST  2 VIEW

[chest lat]
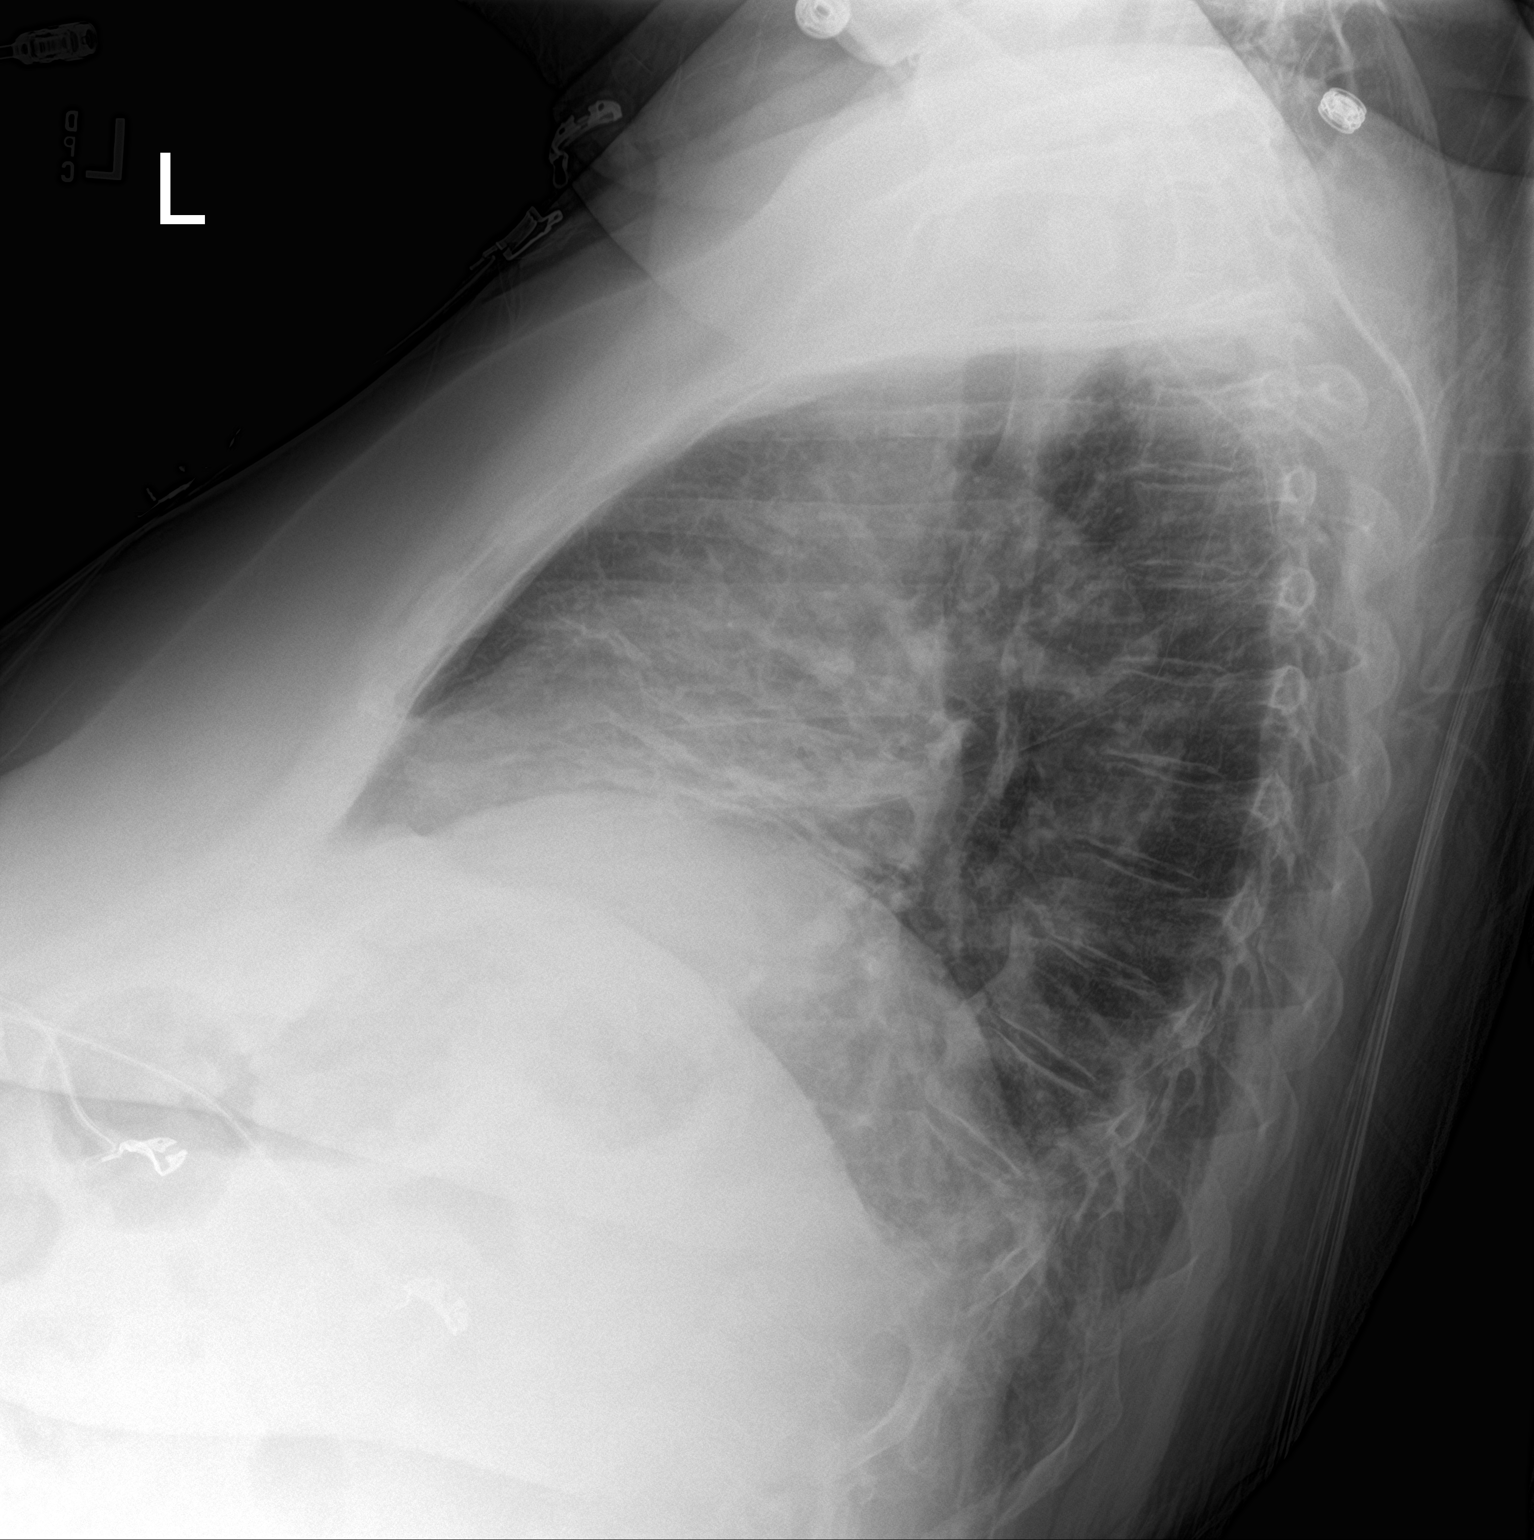

[chest ap]
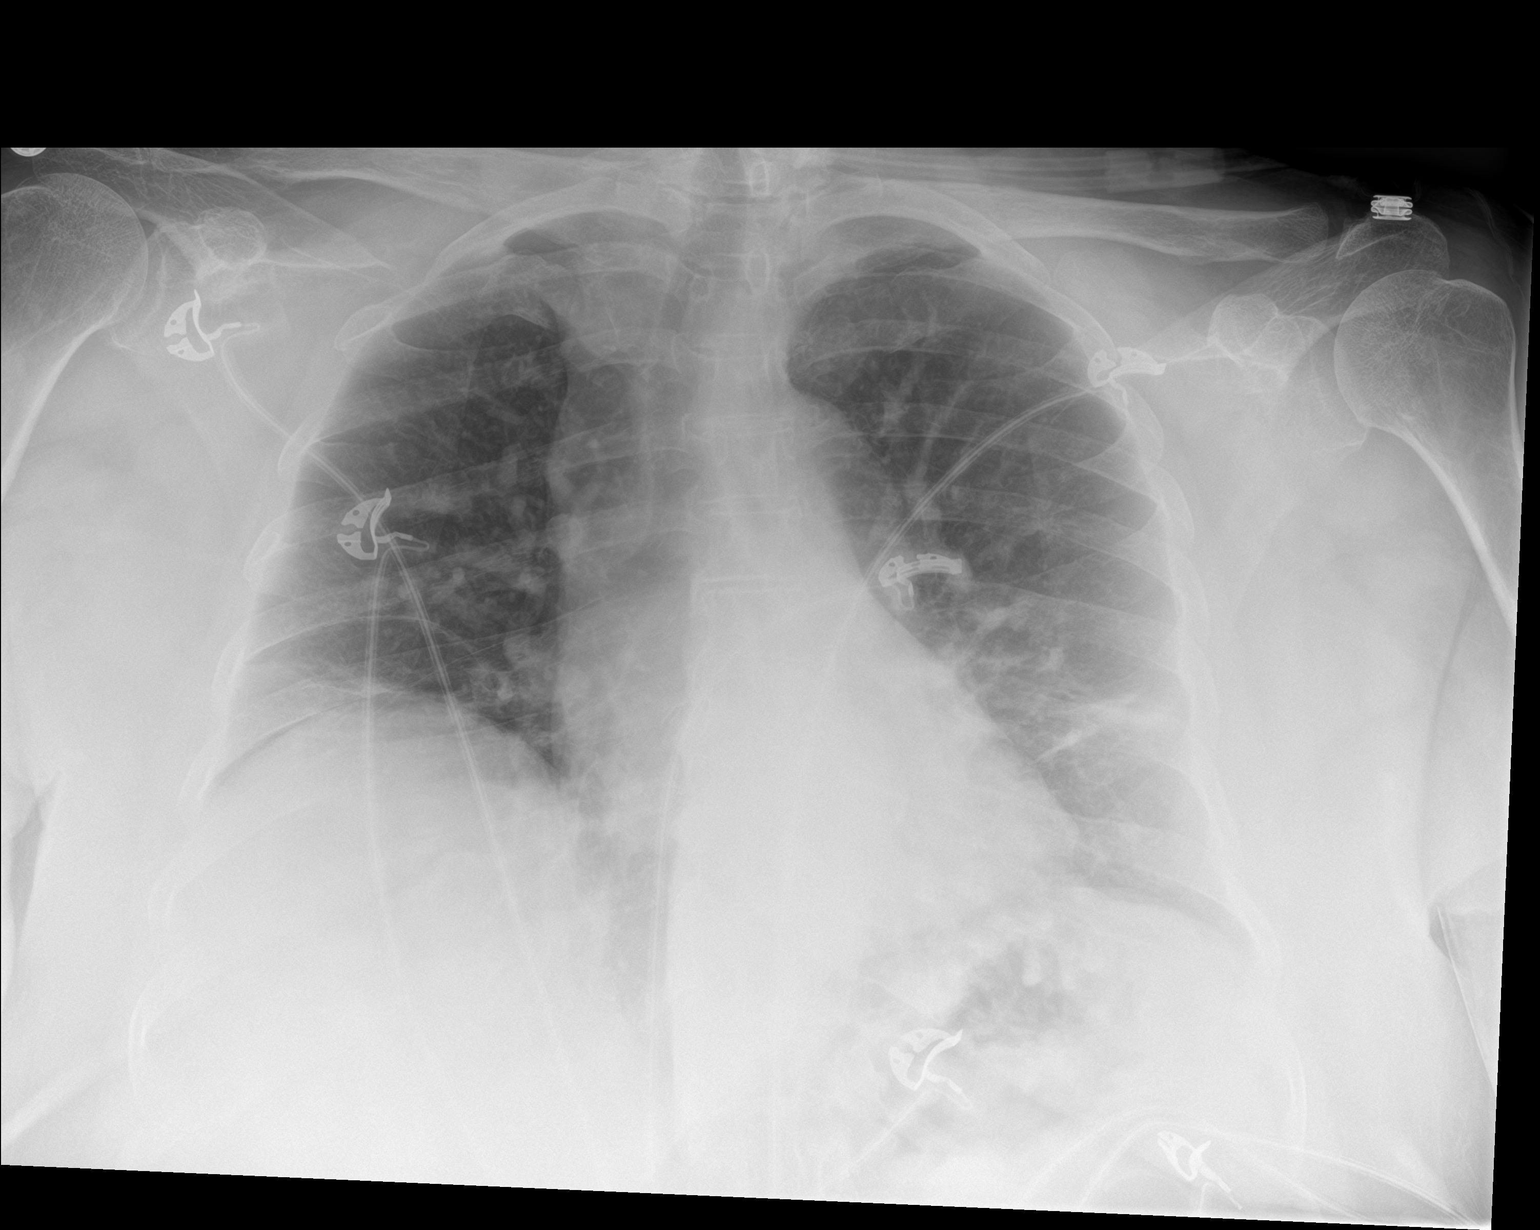

[2 of 2 positions shown; findings below may reference images not displayed]

FINDINGS: Lower lung volumes with increased bibasilar atelectasis. Normal
heart size with mild vascular congestion centrally. No large
effusion or pneumothorax. Trachea is midline. Degenerative changes
of the spine.
IMPRESSION: Lower lung volumes with bibasilar atelectasis.

## 2019-08-13 ENCOUNTER — Ambulatory Visit (HOSPITAL_COMMUNITY): Payer: Medicare Other | Attending: Family Medicine | Admitting: Physical Therapy

## 2019-08-13 ENCOUNTER — Encounter (HOSPITAL_COMMUNITY): Payer: Self-pay | Admitting: Physical Therapy

## 2019-08-13 ENCOUNTER — Other Ambulatory Visit: Payer: Self-pay

## 2019-08-13 DIAGNOSIS — M542 Cervicalgia: Secondary | ICD-10-CM | POA: Diagnosis not present

## 2019-08-13 DIAGNOSIS — R296 Repeated falls: Secondary | ICD-10-CM | POA: Diagnosis not present

## 2019-08-13 DIAGNOSIS — M6281 Muscle weakness (generalized): Secondary | ICD-10-CM

## 2019-08-13 DIAGNOSIS — R262 Difficulty in walking, not elsewhere classified: Secondary | ICD-10-CM | POA: Diagnosis not present

## 2019-08-13 NOTE — Therapy (Signed)
[place], [place], [address] Phone: [phone]   Fax:  [phone]  Physical Therapy Treatment  Patient Details  Name: Cheryl Abbott MRN: [id number] Date of Birth: [birth year] Referring Provider (PT): Tula Nakayama   Encounter Date: 08/13/2019  PT End of Session - 08/13/19 1611    Visit Number  4    Number of Visits  6    Date for PT Re-Evaluation  08/27/19    Authorization Type  [employer] medicare    Authorization Time Period  no auth required. POC dates 07/16/19 to 08/27/2019    Authorization - Visit Number  4    Authorization - Number of Visits  6    PT Start Time  6415    PT Stop Time  1611    PT Time Calculation (min)  31 min    Activity Tolerance  Patient limited by pain;Patient limited by fatigue;No increased pain   Reports pain reduced to 23/10 at EOS, was 5/10 at beginning of session   Behavior During Therapy  [facility] for tasks assessed/performed       Past Medical History:  Diagnosis Date  . Anemia   . Anxiety   . Anxiety disorder   . Arthritis   . Cancer ([place])    acute myeloma  . 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 Chronic back pain   . Depression   . Fatty liver   . GERD (gastroesophageal reflux disease)   . Headache   . History of adenomatous polyp of colon    2008  . History of kidney stones   . Hypercalcemia   . Hyperlipidemia   . Hypertension   . Left ureteral stone   . Lipoma of arm 01/19/2015  . Lumbar disc disease with radiculopathy   . Nephrolithiasis   . Neuropathy, peripheral   . OSA treated with BiPAP    per study 2007  . RENAL CALCULUS 03/15/2009   Qualifier: Diagnosis of  By: Zeb Comfort    . Sciatica   . Shoulder pain, bilateral   . Sigmoid diverticulosis   . Type 2 diabetes mellitus ([place])   . Vaginal dryness, menopausal 10/14/2016    Past Surgical History:  Procedure Laterality Date  . BIOPSY N/A 03/17/2013   Procedure: GASTRIC BIOPSIES;  Surgeon: Danie Binder, MD;  Location: [facility];  Service: Endoscopy;  Laterality: N/A;  . BIOPSY  06/10/2015   Procedure: BIOPSY;  Surgeon: Danie Binder, MD;  [facility];  Service: Endoscopy;;  gastric biopsy  . CARDIAC CATHETERIZATION  05-11-2003  dr Shelva Majestic ([facility])   Abnormal cardiolite/   Normal coronary arteries and normal LVF,  ef  63%  . CARDIOVASCULAR STRESS TEST  01-01-2014   normal lexiscan cardiolite/  no ischemia/ infarct/  normal LV function and wall motion ,  ef 81%  . COLONOSCOPY  last one 10/02/2011   MOD [place] TICS, IH, NEXT TCS APR 2018  . COLONOSCOPY WITH PROPOFOL N/A 02/26/2017   Procedure: COLONOSCOPY WITH PROPOFOL;  Surgeon: Danie Binder, MD;  [facility];  Service: Endoscopy;  Laterality: N/A;  11:00am  . CYSTO/   RIGHT URETEROSOCOPY LASER LITHOTRIPSY STONE EXTRACTION  10-13-2004  . CYSTO/  RIGHT RETROGRADE PYELOGRAM/  PLACMENT RIGHT URETERAL STENT  01-10-2010  . CYSTOSCOPY W/ URETERAL STENT PLACEMENT Right 08/19/2015   Procedure: CYSTOSCOPY WITH RETROGRADE PYELOGRAM/URETERAL STENT PLACEMENT;  Surgeon: Franchot Gallo, MD;  Location: [facility];  Service:  Urology;  Laterality: Right;  . CYSTOSCOPY WITH RETROGRADE PYELOGRAM, URETEROSCOPY AND STENT PLACEMENT Left 10/21/2014   Procedure: CYSTOSCOPY WITH RETROGRADE PYELOGRAM, URETEROSCOPY AND STENT PLACEMENT;  Surgeon: Franchot Gallo, MD;  Location: [facility];  Service: Urology;  Laterality: Left;  . CYSTOSCOPY WITH RETROGRADE PYELOGRAM, URETEROSCOPY AND STENT PLACEMENT Right 11/22/2015   Procedure: CYSTOSCOPY, RIGHT URETERAL STENT REMOVAL; RIGHT RETROGRADE PYELOGRAM, RIGHT URETEROSCOPY WITH BALLOON DILATION; RIGHT URETERAL STENT PLACEMENT;  Surgeon: Franchot Gallo, MD;  [facility]: [facility];  Service: Urology;  Laterality: Right;  . CYSTOSCOPY/RETROGRADE/URETEROSCOPY/STONE EXTRACTION WITH BASKET Bilateral 08/02/2015   Procedure: CYSTOSCOPY; BILATERAL RETROGRADE PYELOGRAMS; BILATERAL  URETEROSCOPY, STONE EXTRACTION WITH BASKET;  Surgeon: Franchot Gallo, MD;  Location[facility];  Service: Urology;  Laterality: Bilateral;  . CYSTOSCOPY/RETROGRADE/URETEROSCOPY/STONE EXTRACTION WITH BASKET Right 06/28/2015   Procedure: CYSTOSCOPY/RETROGRADE/URETEROSCOPY/STONE EXTRACTION WITH BASKET, RIGHT URETERAL DOUBLE J STENT PLACEMENT;  Surgeon: Franchot Gallo, MD;  [facility];  Service: Urology;  Laterality: Right;  . ESOPHAGOGASTRODUODENOSCOPY (EGD) WITH PROPOFOL N/A 03/17/2013   Procedure: ESOPHAGOGASTRODUODENOSCOPY (EGD) WITH PROPOFOL;  Surgeon: Danie Binder, MD;  [facility];  Service: Endoscopy;  Laterality: N/A;  . ESOPHAGOGASTRODUODENOSCOPY (EGD) WITH PROPOFOL N/A 06/10/2015   Distal gastritis, distal esophageal stricture s/p dilation  . ESOPHAGOGASTRODUODENOSCOPY (EGD) WITH PROPOFOL N/A 02/26/2017   Procedure: ESOPHAGOGASTRODUODENOSCOPY (EGD) WITH PROPOFOL;  Surgeon: Danie Binder, MD;  [facility];  Service: Endoscopy;  Laterality: N/A;  . FLEXIBLE SIGMOIDOSCOPY  09/11/2011   JHE:RDEYCXKG Internal hemorrhoids  . HOLMIUM LASER APPLICATION Left 01/26/5630   Procedure: HOLMIUM LASER APPLICATION;  Surgeon: Franchot Gallo, MD;  Location[facility];  Service: Urology;  Laterality: Left;  . HOLMIUM LASER APPLICATION Right 4/97/0263   Procedure: HOLMIUM LASER APPLICATION;  Surgeon: Franchot Gallo, MD;  [facility];  Service: Urology;  Laterality: Right;  . MASS EXCISION Left 03/04/2015   Procedure: EXCISION OF SOFT TISSUE NEOPLASM LEFT ARM;  Surgeon: Aviva Signs, MD;  Location: [facility];  Service: General;  Laterality: Left;  . PERCUTANEOUS NEPHROSTOLITHOTOMY Bilateral 12/  2011     [facility]  . POLYPECTOMY N/A 03/17/2013   Procedure: GASTRIC POLYPECTOMY;  Surgeon: Danie Binder, MD;  [facility]: [facility];  Service: Endoscopy;  Laterality: N/A;  . POLYPECTOMY  02/26/2017   Procedure: POLYPECTOMY;  Surgeon: Danie Binder, MD;  [facility];  Service: Endoscopy;;  polyp at cecum, ascending colon polyps x3, hepatic flexure polyps x8, transverse colon polyps x8   . REMOVAL RIGHT THIGH CYST  2006  . SAVORY DILATION N/A 03/17/2013   Procedure: SAVORY DILATION;  Surgeon: Danie Binder, MD;  Location: [facility];  Service: Endoscopy;  Laterality: N/A;  #12.8, 14, 15, 16 dilators used  . SAVORY DILATION N/A 06/10/2015   Procedure: SAVORY DILATION;  Surgeon: Danie Binder, MD;  [facility];  Service: Endoscopy;  Laterality: N/A;  . TRANSTHORACIC ECHOCARDIOGRAM  01-01-2014   mild LVH/  ef 78-58%/  grade I diastolic dysfunction/  trivial MR, TR, and PR  . VAGINAL HYSTERECTOMY  1970's    There were no vitals filed for this visit.  Subjective Assessment - 08/13/19 1547    Subjective  Pt sates that she is not having any pain in her neck today    Pertinent History  myeloma (cancer of blood), HTN, DB, anemic    Patient Stated Goals  reduce pain    Currently in Pain?  No/denies    Pain Onset  More than a month ago  [facility] Adult PT Treatment/Exercise - 08/13/19 0001      Exercises   Exercises  Neck;Shoulder      Neck Exercises: Seated   Shoulder Rolls  Backwards;10 reps    Other Seated Exercise  3D cervical excursion    Other Seated Exercise  upper trap stretch 2 x 30" B, thoracic excursions x 3       Shoulder Exercises: Seated   Protraction  Both;10 reps    Protraction Limitations  pro/retraction with wand x 10     External Rotation  Both;10 reps    Flexion  AAROM;Both;5 reps    Flexion Limitations  wamd     Abduction  AAROM;Both;5 reps    ABduction Limitations  wand              PT Education - 08/13/19 1552    Education Details  HEp    Person(s) Educated  Patient    Methods  Explanation;Demonstration;Tactile cues;Handout;Verbal cues    Comprehension  Verbalized understanding;Returned demonstration       PT Short Term Goals - 07/16/19 1550      PT SHORT TERM  GOAL #1   Title  Patient will be independent in HEP to improve functional outcomes.    Time  3    Period  Weeks    Status  New    Target Date  08/06/19      PT SHORT TERM GOAL #2   Title  Patient will report at least 25% improvement in overall symptoms to improve QOL.    Time  3    Period  Weeks    Status  New    Target Date  08/06/19        PT Long Term Goals - 07/16/19 1551      PT LONG TERM GOAL #1   Title  Patient will demonstrate at least 60 degrees of cervical rotation in both directions to improve spinal mobility.    Time  6    Period  Weeks    Status  New    Target Date  08/27/19      PT LONG TERM GOAL #2   Title  Patient will report at least 50% improvement in overall symptoms to improve QOL.    Time  6    Period  Days    Status  New    Target Date  08/27/19            Plan - 08/13/19 1611    Clinical Impression Statement  Pt treatment focused on giving pt a HEP as she is only coming once a day.  Atttempted manual at end of session but pt could not tolerate it.    Personal Factors and Comorbidities  Age;Comorbidity 1;Comorbidity 2;Comorbidity 3+;Transportation    Comorbidities  DB, Myeloma, HTN    Examination-Activity Limitations  Sleep;Sit;Bathing;Bed Mobility;Hygiene/Grooming;Carry;Squat;Transfers    Examination-Participation Restrictions  Cleaning;Driving;Meal Prep;Shop    Stability/Clinical Decision Making  Evolving/Moderate complexity    Rehab Potential  Poor    PT Frequency  1x / week    PT Duration  6 weeks    PT Treatment/Interventions  ADLs/Self Care Home Management;Cryotherapy;Electrical Stimulation;Moist Heat;Traction;Balance training;Therapeutic exercise;Therapeutic activities;Functional mobility training;Stair training;Gait training;Neuromuscular re-education;Patient/family education;Manual techniques;Passive range of motion    PT Next Visit Plan  cervical mobility, soft tisse mobilization as needed for pain, posture exercises.  Next session  progress with throacic excursions without UE movement and postural strengthening.    PT Home Exercise Plan  07/23/2019: 3D cervical excursion  and scapular retraction.; 3/4:  thoracic excursion, sitting upper trap stretch, wand shoulder flexion, abduction and ER.       Patient will benefit from skilled therapeutic intervention in order to improve the following deficits and impairments:  Pain, Decreased balance, Decreased mobility, Difficulty walking, Decreased range of motion, Decreased activity tolerance  Visit Diagnosis: Cervicalgia  Muscle weakness (generalized)     Problem List Patient Active Problem List   Diagnosis Date Noted  . Cervical spondylosis 07/13/2019  . Cervical spondylosis with radiculopathy 07/13/2019  . Canker sore 07/05/2019  . Muscle cramps 07/01/2019  . Depression, major, single episode, in partial remission ([place]) 03/13/2019  . Annual physical exam 03/12/2019  . Eyelid cyst, right 01/15/2019  . 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 Shoulder pain, left 02/28/2018  . Palpable mass of neck 02/28/2018  . Exertional dyspnea 02/28/2018  . Insomnia 11/09/2017  . Multiple myeloma ([place]) 03/15/2017  . Urinary frequency 02/28/2017  . 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 Neuropathic pain of both feet 04/11/2016  . TIA (transient ischemic attack) 02/26/2016  . Lactose intolerance in adult 02/01/2016  . Brain TIA 08/19/2015  . CKD (chronic kidney disease) stage 3, GFR 30-59 ml/min ([facility]) 07/12/2015  . Lipoma of arm 01/19/2015  . Anemia 10/14/2014  . Lumbar back pain with radiculopathy affecting left lower extremity 05/11/2014  . Nocturnal hypoxia 02/04/2014  . Obesity (BMI 30.0-34.9) 01/02/2014  . Generalized anxiety disorder 05/20/2013  . Anxiety and depression 08/13/2012  . Abnormal brain scan 02/07/2012  . Thoracic or lumbosacral neuritis or radiculitis, unspecified 12/22/2010  . Hypercalcemia 11/30/2010  . Abdominal bloating 09/08/2009  . Colon adenomas  05/11/2009  . HEMORRHOIDS 03/15/2009  . GERD 03/15/2009  . FATTY LIVER DISEASE 03/15/2009  . Sleep apnea 09/15/2008  . Type 2 diabetes with nephropathy ([place]) 02/02/2008  . Hyperlipidemia LDL goal <100 09/17/2007  . Hypertension goal BP (blood pressure) < 130/80 09/17/2007    Rayetta Humphrey, PT CLT [phone] 08/13/2019, 4:17 PM  [place] [address] [place], [place], [address] Phone: [phone]   Fax:  [phone]  Name: CLYDE UPSHAW MRN: [id number] Date of Birth: 1946-04-28

## 2019-08-14 ENCOUNTER — Encounter: Payer: Self-pay | Admitting: Family Medicine

## 2019-08-14 ENCOUNTER — Ambulatory Visit (INDEPENDENT_AMBULATORY_CARE_PROVIDER_SITE_OTHER): Payer: Medicare Other | Admitting: Family Medicine

## 2019-08-14 VITALS — BP 130/80 | HR 100 | Temp 96.0°F | Resp 15 | Ht 66.0 in | Wt 195.0 lb

## 2019-08-14 DIAGNOSIS — N1832 Chronic kidney disease, stage 3b: Secondary | ICD-10-CM

## 2019-08-14 DIAGNOSIS — G8929 Other chronic pain: Secondary | ICD-10-CM | POA: Diagnosis not present

## 2019-08-14 DIAGNOSIS — M25512 Pain in left shoulder: Secondary | ICD-10-CM

## 2019-08-14 MED ORDER — METHYLPREDNISOLONE ACETATE 80 MG/ML IJ SUSP
80.0000 mg | Freq: Once | INTRAMUSCULAR | Status: AC
  Administered 2019-08-14: 80 mg via INTRAMUSCULAR

## 2019-08-14 NOTE — Progress Notes (Signed)
Subjective:  Patient ID: Cheryl Abbott, female    DOB: [birth year]  Age: 74 y.o. MRN: [id number]  CC:  Chief Complaint  Patient presents with  . Abdominal Pain  . Neck Pain      HPI  HPI  Cheryl Abbott is a 74 year old female patient who has an extensive history including but not limited to anxiety, arthritis, myeloma, depression, fatty liver, hypertension, hyperlipidemia, kidney disease.  She presents today with ongoing chronic left neck and shoulder discomfort.  And new right lower quadrant pain.  Left shoulder/neck chronic pain: This is been ongoing and duration.  She describes as throbbing, tightness is working with physical therapy.  Reports that sometimes she has this sharp pain and tingling and she has a difficult time when physical therapy is massaging the area.  She has 3 more weeks of physical therapy left she reports that it has helped a lot in some ways but it also hurts her when she is doing her therapy or the day of therapy as it is more uncomfortable.  Reports that she tries to do them some numbing gel for her shoulder.  Without much relief.  She reports first pain scale to be a 5 to an 8 out of 10 when its aggravated.  Right lower quadrant pain: she denies having any relationship to change in bowels.  No blood in urine or stool.  Reports she is unsure if she has had any previous pain in the periumbilical area.  And it moves down.  She denies having any nausea, vomiting.  She reports it is a sharp pain close to the thigh groin aspect.  She denies have any lumps bumps, straining.  She reports she feels it the most when she stands up and starts walking.  She reports is relieved with laying down.  She says that she cannot really rate the pain on a scale 0-10 as its pretty intermittent and specific to the time.   Today patient denies signs and symptoms of COVID 19 infection including fever, chills, cough, shortness of breath, and headache. Past Medical, Surgical, Social  History, Allergies, and Medications have been Reviewed.   Past Medical History:  Diagnosis Date  . Abnormal brain scan 02/07/2012  . Anemia   . Anxiety   . Anxiety disorder   . Arthritis   . Brain TIA 08/19/2015  . Cancer ([place])    acute myeloma  . 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 Chronic back pain   . Depression   . Fatty liver   . GERD (gastroesophageal reflux disease)   . Headache   . HEMORRHOIDS 03/15/2009   OCT 2014 HB 12. 9    . History of adenomatous polyp of colon    2008  . History of kidney stones   . Hypercalcemia   . Hypercalcemia 11/30/2010  . Hyperlipidemia   . Hypertension   . Lactose intolerance in adult 02/01/2016  . Left ureteral stone   . Lipoma of arm 01/19/2015  . Lumbar disc disease with radiculopathy   . Nephrolithiasis   . Neuropathic pain of both feet 04/11/2016  . Neuropathy, peripheral   . Nocturnal hypoxia 02/04/2014   Study confirms this done 02/10/2014   . OSA treated with BiPAP    per study 2007  . RENAL CALCULUS 03/15/2009   Qualifier: Diagnosis of  By: Zeb Comfort    . Sciatica   . Shoulder pain, bilateral   . Sigmoid diverticulosis   . TIA (transient ischemic attack)  02/26/2016  . Type 2 diabetes mellitus ([place])   . Vaginal dryness, menopausal 10/14/2016    Current Meds  Medication Sig  . amLODipine (NORVASC) 10 MG tablet TAKE 1 TABLET BY MOUTH EVERY DAY  . aspirin EC 325 MG tablet Take 81 mg by mouth daily. Takes 81 mg daily  . budesonide-formoterol (SYMBICORT) 80-4.5 MCG/ACT inhaler INHALE 2 PUFFS INTO THE LUNGS TWICE A DAY  . calcium-vitamin D (OSCAL WITH D) 500-200 MG-UNIT tablet Take 1 tablet by mouth daily.  . diclofenac sodium (VOLTAREN) 1 % GEL Apply 4 g topically 4 (four) times daily.  . furosemide (LASIX) 40 MG tablet TAKE 1 TABLET BY MOUTH DAILY AS NEEDED FOR SWELLING  . KLOR-CON M20 20 MEQ tablet Take 20 mEq by mouth daily.  Marland Kitchen lovastatin (MEVACOR) 40 MG tablet TAKE 1 TABLET AT BEDTIME  . Melatonin 10 MG TABS Take  10 mg by mouth as needed.   . metroNIDAZOLE (FLAGYL) 500 MG tablet Take 1 tablet (500 mg total) by mouth 2 (two) times daily. FOR 5 DAYS  . pantoprazole (PROTONIX) 40 MG tablet TAKE 1 TABLET (40 MG TOTAL) BY MOUTH 2 (TWO) TIMES DAILY.  Marland Kitchen PARoxetine (PAXIL) 40 MG tablet TAKE 1 TABLET BY MOUTH EVERY DAY IN THE MORNING  . potassium chloride (K-DUR) 10 MEQ tablet Take 1 tablet (10 mEq total) by mouth daily.  . tizanidine (ZANAFLEX) 2 MG capsule Take 1 capsule (2 mg total) by mouth at bedtime as needed for muscle spasms.  [facility] ophthalmic ointment Place 1 application into the right eye 2 (two) times daily.  . TRADJENTA 5 MG TABS tablet TAKE 1 TABLET BY MOUTH EVERY DAY  . traZODone (DESYREL) 100 MG tablet Take 100 mg by mouth at bedtime.    ROS:  Review of Systems  Constitutional: Negative.   HENT: Negative.   Eyes: Negative.   Respiratory: Negative.   Cardiovascular: Negative.   Gastrointestinal: Positive for abdominal pain.  Genitourinary: Negative.   Musculoskeletal: Positive for joint pain and neck pain.  Skin: Negative.   Neurological: Negative.   Endo/Heme/Allergies: Negative.   Psychiatric/Behavioral: Negative.   All other systems reviewed and are negative.    Objective:   Today's Vitals: BP 130/80   Pulse 100   Temp (!) 96 F (35.6 C) (Temporal)   Resp 15   Ht 5\' 6"  (1.676 m)   Wt 195 lb 0.6 oz (88.5 kg)   SpO2 94%   BMI 31.48 kg/m  Vitals with BMI 08/14/2019 07/23/2019 07/13/2019  Height 5\' 6"  5' 6.5" 5\' 6"   Weight 195 lbs 1 oz 200 lbs 3 oz 198 lbs  BMI 31.5 09.32 35.57  Systolic 322 025 427  Diastolic 80 76 70  Pulse 062 95 -     Physical Exam Vitals and nursing note reviewed.  Constitutional:      Appearance: Normal appearance. She is well-developed. She is obese.  HENT:     Head: Normocephalic and atraumatic.     Right Ear: External ear normal.     Left Ear: External ear normal.     Nose: Nose normal.     Mouth/Throat:     Comments: Mask in place    Eyes:     General:        Right eye: No discharge.        Left eye: No discharge.     Conjunctiva/sclera: Conjunctivae normal.  Cardiovascular:     Rate and Rhythm: Normal rate and regular rhythm.  Pulses: Normal pulses.     Heart sounds: Normal heart sounds.  Pulmonary:     Effort: Pulmonary effort is normal.     Breath sounds: Normal breath sounds.  Abdominal:     General: Bowel sounds are normal. There is distension.     Tenderness: There is abdominal tenderness in the right lower quadrant. There is no right CVA tenderness, left CVA tenderness, guarding or rebound. Negative signs include Murphy's sign, Rovsing's sign, McBurney's sign, psoas sign and obturator sign.     Hernia: No hernia is present. There is no hernia in the umbilical area, ventral area, left inguinal area, right femoral area, left femoral area or right inguinal area.  Musculoskeletal:     Right shoulder: Normal.     Left shoulder: Normal.     Right upper arm: Normal.     Left upper arm: Normal.     Cervical back: Normal, normal range of motion and neck supple.  Skin:    General: Skin is warm.  Neurological:     General: No focal deficit present.     Mental Status: She is alert and oriented to person, place, and time.  Psychiatric:        Mood and Affect: Mood normal.        Behavior: Behavior normal.        Thought Content: Thought content normal.        Judgment: Judgment normal.       Assessment   1. Chronic left shoulder pain   2. Stage 3b chronic kidney disease     Tests ordered Orders Placed This Encounter  Procedures  . COMPLETE METABOLIC PANEL WITH GFR  . CBC     Plan: Please see assessment and plan per problem list above.   Meds ordered this encounter  Medications  . methylPREDNISolone acetate (DEPO-MEDROL) injection 80 mg    Patient to follow-up in 11/16/2019   Perlie Mayo, NP

## 2019-08-14 NOTE — Patient Instructions (Addendum)
I appreciate the opportunity to provide you with care for your health and wellness. Today we discussed: pain in abdomen and neck   Follow up: 11/16/2019  Labs-today  Injection for pain today  Please continue to practice social distancing to keep you, your family, and our community safe.  If you must go out, please wear a mask and practice good handwashing.  It was a pleasure to see you and I look forward to continuing to work together on your health and well-being. Please do not hesitate to call the office if you need care or have questions about your care.  Have a wonderful day and week. With Gratitude, Cherly Beach, DNP, AGNP-BC

## 2019-08-14 NOTE — Assessment & Plan Note (Signed)
Reports that PT is helping, but she is having increased pain in the days she does it. 3 more weeks of PT left.  Provided with steroid injection today in the office.  To help with inflammation.

## 2019-08-14 NOTE — Assessment & Plan Note (Signed)
Needs updated lab work ordered today.  Continued hydration advised for her to refrain from using NSAID-like medications for discomfort and pain.

## 2019-08-15 LAB — COMPLETE METABOLIC PANEL WITH GFR
AG Ratio: 1 (calc) (ref 1.0–2.5)
ALT: 19 U/L (ref 6–29)
AST: 22 U/L (ref 10–35)
Albumin: 4.3 g/dL (ref 3.6–5.1)
Alkaline phosphatase (APISO): 78 U/L (ref 37–153)
BUN/Creatinine Ratio: 8 (calc) (ref 6–22)
BUN: 14 mg/dL (ref 7–25)
CO2: 25 mmol/L (ref 20–32)
Calcium: 10.5 mg/dL — ABNORMAL HIGH (ref 8.6–10.4)
Chloride: 102 mmol/L (ref 98–110)
Creat: 1.86 mg/dL — ABNORMAL HIGH (ref 0.60–0.93)
GFR, Est African American: 30 mL/min/{1.73_m2} — ABNORMAL LOW (ref >=60)
GFR, Est Non African American: 26 mL/min/{1.73_m2} — ABNORMAL LOW (ref >=60)
Globulin: 4.5 g/dL (calc) — ABNORMAL HIGH (ref 1.9–3.7)
Glucose, Bld: 77 mg/dL (ref 65–139)
Potassium: 4.6 mmol/L (ref 3.5–5.3)
Sodium: 137 mmol/L (ref 135–146)
Total Bilirubin: 0.3 mg/dL (ref 0.2–1.2)
Total Protein: 8.8 g/dL — ABNORMAL HIGH (ref 6.1–8.1)

## 2019-08-15 LAB — CBC
HCT: 38.8 % (ref 35.0–45.0)
Hemoglobin: 12.9 g/dL (ref 11.7–15.5)
MCH: 30.8 pg (ref 27.0–33.0)
MCHC: 33.2 g/dL (ref 32.0–36.0)
MCV: 92.6 fL (ref 80.0–100.0)
MPV: 10 fL (ref 7.5–12.5)
Platelets: 368 10*3/uL (ref 140–400)
RBC: 4.19 10*6/uL (ref 3.80–5.10)
RDW: 14.6 % (ref 11.0–15.0)
WBC: 9.6 10*3/uL (ref 3.8–10.8)

## 2019-08-17 ENCOUNTER — Telehealth: Payer: Self-pay

## 2019-08-17 NOTE — Telephone Encounter (Signed)
Pt LVM request status on labs

## 2019-08-18 ENCOUNTER — Other Ambulatory Visit: Payer: Self-pay

## 2019-08-18 ENCOUNTER — Ambulatory Visit (HOSPITAL_COMMUNITY): Payer: Medicare Other | Admitting: Physical Therapy

## 2019-08-18 DIAGNOSIS — M542 Cervicalgia: Secondary | ICD-10-CM | POA: Diagnosis not present

## 2019-08-18 DIAGNOSIS — R262 Difficulty in walking, not elsewhere classified: Secondary | ICD-10-CM | POA: Diagnosis not present

## 2019-08-18 DIAGNOSIS — M6281 Muscle weakness (generalized): Secondary | ICD-10-CM

## 2019-08-18 DIAGNOSIS — R0902 Hypoxemia: Secondary | ICD-10-CM | POA: Diagnosis not present

## 2019-08-18 DIAGNOSIS — R296 Repeated falls: Secondary | ICD-10-CM | POA: Diagnosis not present

## 2019-08-18 NOTE — Therapy (Signed)
[place], [place], [address] Phone: [phone]   Fax:  [phone]  Physical Therapy Treatment  Patient Details  Name: Cheryl Abbott MRN: [id number] Date of Birth: 11-Feb-1946 Referring Provider (PT): Tula Nakayama   Encounter Date: 08/18/2019  PT End of Session - 08/18/19 1458    Visit Number  5    Number of Visits  6    Date for PT Re-Evaluation  08/27/19    Authorization Type  [employer] medicare    Authorization Time Period  no auth required. POC dates 07/16/19 to 08/27/2019    Authorization - Visit Number  5    Authorization - Number of Visits  6    PT Start Time  1400    PT Stop Time  1445    PT Time Calculation (min)  45 min    Activity Tolerance  Patient limited by pain;Patient limited by fatigue;No increased pain   Reports pain reduced to 23/10 at EOS, was 5/10 at beginning of session   Behavior During Therapy  [facility] for tasks assessed/performed       Past Medical History:  Diagnosis Date  . Abnormal brain scan 02/07/2012  . Anemia   . Anxiety   . Anxiety disorder   . Arthritis   . Brain TIA 08/19/2015  . Cancer ([place])    acute myeloma  . 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 Chronic back pain   . Depression   . Fatty liver   . GERD (gastroesophageal reflux disease)   . Headache   . HEMORRHOIDS 03/15/2009   OCT 2014 HB 12. 9    . History of adenomatous polyp of colon    2008  . History of kidney stones   . Hypercalcemia   . Hypercalcemia 11/30/2010  . Hyperlipidemia   . Hypertension   . Lactose intolerance in adult 02/01/2016  . Left ureteral stone   . Lipoma of arm 01/19/2015  . Lumbar disc disease with radiculopathy   . Nephrolithiasis   . Neuropathic pain of both feet 04/11/2016  . Neuropathy, peripheral   . Nocturnal hypoxia 02/04/2014   Study confirms this done 02/10/2014   . OSA treated with BiPAP    per study 2007  . RENAL CALCULUS 03/15/2009   Qualifier: Diagnosis of  By:  Zeb Comfort    . Sciatica   . Shoulder pain, bilateral   . Sigmoid diverticulosis   . TIA (transient ischemic attack) 02/26/2016  . Type 2 diabetes mellitus ([place])   . Vaginal dryness, menopausal 10/14/2016    Past Surgical History:  Procedure Laterality Date  . BIOPSY N/A 03/17/2013   Procedure: GASTRIC BIOPSIES;  Surgeon: Danie Binder, MD;  Location: [facility];  Service: Endoscopy;  Laterality: N/A;  . BIOPSY  06/10/2015   Procedure: BIOPSY;  Surgeon: Danie Binder, MD;  [facility];  Service: Endoscopy;;  gastric biopsy  . CARDIAC CATHETERIZATION  05-11-2003  dr Shelva Majestic ([facility])   Abnormal cardiolite/   Normal coronary arteries and normal LVF,  ef  63%  . CARDIOVASCULAR STRESS TEST  01-01-2014   normal lexiscan cardiolite/  no ischemia/ infarct/  normal LV function and wall motion ,  ef 81%  . COLONOSCOPY  last one 10/02/2011   MOD [place] TICS, IH, NEXT TCS APR 2018  . COLONOSCOPY WITH PROPOFOL N/A 02/26/2017   Procedure: COLONOSCOPY WITH PROPOFOL;  Surgeon: Danie Binder, MD;  [facility];  Service: Endoscopy;  Laterality: N/A;  11:00am  . CYSTO/   RIGHT URETEROSOCOPY LASER LITHOTRIPSY STONE EXTRACTION  10-13-2004  . CYSTO/  RIGHT RETROGRADE PYELOGRAM/  PLACMENT RIGHT URETERAL STENT  01-10-2010  . CYSTOSCOPY W/ URETERAL STENT PLACEMENT Right 08/19/2015   Procedure: CYSTOSCOPY WITH RETROGRADE PYELOGRAM/URETERAL STENT PLACEMENT;  Surgeon: Franchot Gallo, MD;  Location: [facility];  Service: Urology;  Laterality: Right;  . CYSTOSCOPY WITH RETROGRADE PYELOGRAM, URETEROSCOPY AND STENT PLACEMENT Left 10/21/2014   Procedure: CYSTOSCOPY WITH RETROGRADE PYELOGRAM, URETEROSCOPY AND STENT PLACEMENT;  Surgeon: Franchot Gallo, MD;  Location: [facility];  Service: Urology;  Laterality: Left;  . CYSTOSCOPY WITH RETROGRADE PYELOGRAM, URETEROSCOPY AND STENT PLACEMENT Right 11/22/2015   Procedure: CYSTOSCOPY, RIGHT URETERAL STENT REMOVAL; RIGHT  RETROGRADE PYELOGRAM, RIGHT URETEROSCOPY WITH BALLOON DILATION; RIGHT URETERAL STENT PLACEMENT;  Surgeon: Franchot Gallo, MD;  Location: [facility];  Service: Urology;  Laterality: Right;  . CYSTOSCOPY/RETROGRADE/URETEROSCOPY/STONE EXTRACTION WITH BASKET Bilateral 08/02/2015   Procedure: CYSTOSCOPY; BILATERAL RETROGRADE PYELOGRAMS; BILATERAL URETEROSCOPY, STONE EXTRACTION WITH BASKET;  Surgeon: Franchot Gallo, MD;  Location: [facility];  Service: Urology;  Laterality: Bilateral;  . CYSTOSCOPY/RETROGRADE/URETEROSCOPY/STONE EXTRACTION WITH BASKET Right 06/28/2015   Procedure: CYSTOSCOPY/RETROGRADE/URETEROSCOPY/STONE EXTRACTION WITH BASKET, RIGHT URETERAL DOUBLE J STENT PLACEMENT;  Surgeon: Franchot Gallo, MD;  Location: [facility];  Service: Urology;  Laterality: Right;  . ESOPHAGOGASTRODUODENOSCOPY (EGD) WITH PROPOFOL N/A 03/17/2013   Procedure: ESOPHAGOGASTRODUODENOSCOPY (EGD) WITH PROPOFOL;  Surgeon: Danie Binder, MD;  Location[facility];  Service: Endoscopy;  Laterality: N/A;  . ESOPHAGOGASTRODUODENOSCOPY (EGD) WITH PROPOFOL N/A 06/10/2015   Distal gastritis, distal esophageal stricture s/p dilation  . ESOPHAGOGASTRODUODENOSCOPY (EGD) WITH PROPOFOL N/A 02/26/2017   Procedure: ESOPHAGOGASTRODUODENOSCOPY (EGD) WITH PROPOFOL;  Surgeon: Danie Binder, MD;  [facility];  Service: Endoscopy;  Laterality: N/A;  . FLEXIBLE SIGMOIDOSCOPY  09/11/2011   FIE:[id number] Internal hemorrhoids  . HOLMIUM LASER APPLICATION Left 09/24/6061   Procedure: HOLMIUM LASER APPLICATION;  Surgeon: Franchot Gallo, MD;  Location: [facility];  Service: Urology;  Laterality: Left;  . HOLMIUM LASER APPLICATION Right 0/16/0109   Procedure: HOLMIUM LASER APPLICATION;  Surgeon: Franchot Gallo, MD;  Location: [facility];  Service: Urology;  Laterality: Right;  . MASS EXCISION Left 03/04/2015   Procedure: EXCISION OF SOFT TISSUE NEOPLASM LEFT ARM;  Surgeon: Aviva Signs, MD;  Location: [facility];  Service: General;   Laterality: Left;  . PERCUTANEOUS NEPHROSTOLITHOTOMY Bilateral 12/  2011     [facility]  . POLYPECTOMY N/A 03/17/2013   Procedure: GASTRIC POLYPECTOMY;  Surgeon: Danie Binder, MD;  Location: [facility];  Service: Endoscopy;  Laterality: N/A;  . POLYPECTOMY  02/26/2017   Procedure: POLYPECTOMY;  Surgeon: Danie Binder, MD;  [facility];  Service: Endoscopy;;  polyp at cecum, ascending colon polyps x3, hepatic flexure polyps x8, transverse colon polyps x8   . REMOVAL RIGHT THIGH CYST  2006  . SAVORY DILATION N/A 03/17/2013   Procedure: SAVORY DILATION;  Surgeon: Danie Binder, MD;  Location: [facility];  Service: Endoscopy;  Laterality: N/A;  #12.8, 14, 15, 16 dilators used  . SAVORY DILATION N/A 06/10/2015   Procedure: SAVORY DILATION;  Surgeon: Danie Binder, MD;  [facility];  Service: Endoscopy;  Laterality: N/A;  . TRANSTHORACIC ECHOCARDIOGRAM  01-01-2014   mild LVH/  ef 32-35%/  grade I diastolic dysfunction/  trivial MR, TR, and PR  . VAGINAL HYSTERECTOMY  1970's    There were no vitals filed for this visit.  Subjective  Assessment - 08/18/19 1408    Subjective  pt states she's been having issues with her Rt side of her abdomen and went to MD about it.  States she has no other pains other than a little Lt shoulder soreness.    Currently in Pain?  No/denies                       [facility] Adult PT Treatment/Exercise - 08/18/19 0001      Neck Exercises: Seated   Shoulder Rolls  Backwards;10 reps    Other Seated Exercise  3D cervical excursion 5 reps each, thoracic excursions 5 reps with UE movements      Shoulder Exercises: Seated   Protraction  Both;10 reps    Protraction Limitations  pro/retraction with wand x 10     Flexion  AAROM;Both;5 reps               PT Short Term Goals - 07/16/19 1550      PT SHORT TERM GOAL #1   Title  Patient will be independent in HEP to improve functional outcomes.    Time  3    Period  Weeks    Status   New    Target Date  08/06/19      PT SHORT TERM GOAL #2   Title  Patient will report at least 25% improvement in overall symptoms to improve QOL.    Time  3    Period  Weeks    Status  New    Target Date  08/06/19        PT Long Term Goals - 07/16/19 1551      PT LONG TERM GOAL #1   Title  Patient will demonstrate at least 60 degrees of cervical rotation in both directions to improve spinal mobility.    Time  6    Period  Weeks    Status  New    Target Date  08/27/19      PT LONG TERM GOAL #2   Title  Patient will report at least 50% improvement in overall symptoms to improve QOL.    Time  6    Period  Days    Status  New    Target Date  08/27/19            Plan - 08/18/19 1459    Clinical Impression Statement  Pt overall improving with very little complaints in cervical region at this time, minimal pain in Lt shoulder/scap region.  Currently with more abdominal issues as MD is trying to sort out cause of this.  Added UE movements with thoracic excursions this session with only limited ROM completed on Lt vs Rt.  Pt without any pain behaviors or complaints.  Did require VC's to keep focused on task and for correct form.  Pt with frequent reflux episodes during session today due to lactose intolerance (urged pt to try amond or soy milk).    Personal Factors and Comorbidities  Age;Comorbidity 1;Comorbidity 2;Comorbidity 3+;Transportation    Comorbidities  DB, Myeloma, HTN    Examination-Activity Limitations  Sleep;Sit;Bathing;Bed Mobility;Hygiene/Grooming;Carry;Squat;Transfers    Examination-Participation Restrictions  Cleaning;Driving;Meal Prep;Shop    Stability/Clinical Decision Making  Evolving/Moderate complexity    Rehab Potential  Poor    PT Frequency  1x / week    PT Duration  6 weeks    PT Treatment/Interventions  ADLs/Self Care Home Management;Cryotherapy;Electrical Stimulation;Moist Heat;Traction;Balance training;Therapeutic exercise;Therapeutic  activities;Functional mobility training;Stair training;Gait training;Neuromuscular re-education;Patient/family education;Manual  techniques;Passive range of motion    PT Next Visit Plan  cervical mobility, soft tisse mobilization as needed for pain, posture exercises.  Next session progress add postural tband strengthening and complete reasssessment.    PT Home Exercise Plan  07/23/2019: 3D cervical excursion and scapular retraction.; 3/4:  thoracic excursion, sitting upper trap stretch, wand shoulder flexion, abduction and ER.       Patient will benefit from skilled therapeutic intervention in order to improve the following deficits and impairments:  Pain, Decreased balance, Decreased mobility, Difficulty walking, Decreased range of motion, Decreased activity tolerance  Visit Diagnosis: Muscle weakness (generalized)  Cervicalgia     Problem List Patient Active Problem List   Diagnosis Date Noted  . Cervical spondylosis 07/13/2019  . Cervical spondylosis with radiculopathy 07/13/2019  . Muscle cramps 07/01/2019  . Depression, major, single episode, in partial remission ([place]) 03/13/2019  . Annual physical exam 03/12/2019  . Eyelid cyst, right 01/15/2019  . 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 Shoulder pain, left 02/28/2018  . Palpable mass of neck 02/28/2018  . Exertional dyspnea 02/28/2018  . Insomnia 11/09/2017  . Multiple myeloma ([place]) 03/15/2017  . Urinary frequency 02/28/2017  . 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 CKD (chronic kidney disease) stage 3, GFR 30-59 ml/min ([facility]) 07/12/2015  . Anemia 10/14/2014  . Lumbar back pain with radiculopathy affecting left lower extremity 05/11/2014  . Obesity (BMI 30.0-34.9) 01/02/2014  . Generalized anxiety disorder 05/20/2013  . Anxiety and depression 08/13/2012  . Thoracic or lumbosacral neuritis or radiculitis, unspecified 12/22/2010  . Abdominal bloating 09/08/2009  . Colon adenomas 05/11/2009  . GERD 03/15/2009  . FATTY  LIVER DISEASE 03/15/2009  . Sleep apnea 09/15/2008  . Type 2 diabetes with nephropathy ([place]) 02/02/2008  . Hyperlipidemia LDL goal <100 09/17/2007  . Hypertension goal BP (blood pressure) < 130/80 09/17/2007   Teena Irani, PTA/CLT [phone]  Teena Irani 08/18/2019, 3:03 PM  [place], [place], [address] Phone: [phone]   Fax:  [phone]  Name: Cheryl Abbott MRN: [id number] Date of Birth: Sep 10, 1945

## 2019-08-18 NOTE — Telephone Encounter (Signed)
Called pt again, no answer. No option to leave voicemail- box full. Mailed results

## 2019-08-19 ENCOUNTER — Telehealth: Payer: Self-pay

## 2019-08-19 NOTE — Telephone Encounter (Signed)

## 2019-08-20 ENCOUNTER — Telehealth (INDEPENDENT_AMBULATORY_CARE_PROVIDER_SITE_OTHER): Payer: Medicare Other | Admitting: Student

## 2019-08-20 ENCOUNTER — Encounter: Payer: Self-pay | Admitting: Student

## 2019-08-20 VITALS — BP 140/79 | Ht 66.5 in | Wt 195.0 lb

## 2019-08-20 DIAGNOSIS — E785 Hyperlipidemia, unspecified: Secondary | ICD-10-CM

## 2019-08-20 DIAGNOSIS — I129 Hypertensive chronic kidney disease with stage 1 through stage 4 chronic kidney disease, or unspecified chronic kidney disease: Secondary | ICD-10-CM | POA: Diagnosis not present

## 2019-08-20 DIAGNOSIS — R600 Localized edema: Secondary | ICD-10-CM | POA: Diagnosis not present

## 2019-08-20 DIAGNOSIS — N183 Chronic kidney disease, stage 3 unspecified: Secondary | ICD-10-CM | POA: Diagnosis not present

## 2019-08-20 DIAGNOSIS — I10 Essential (primary) hypertension: Secondary | ICD-10-CM

## 2019-08-20 NOTE — Progress Notes (Signed)
Virtual Visit via Telephone Note   This visit type was conducted due to national recommendations for restrictions regarding the COVID-19 Pandemic (e.g. social distancing) in an effort to limit this patient's exposure and mitigate transmission in our community.  Due to her co-morbid illnesses, this patient is at least at moderate risk for complications without adequate follow up.  This format is felt to be most appropriate for this patient at this time.  The patient did not have access to video technology/had technical difficulties with video requiring transitioning to audio format only (telephone).  All issues noted in this document were discussed and addressed.  No physical exam could be performed with this format.  Please refer to the patient's chart for her  consent to telehealth for [facility]   The patient was identified using 2 identifiers.  Date:  08/20/2019   ID:  Cheryl Abbott, DOB 08/29/1945, MRN [id number]  Patient Location: Home Provider Location: Office  PCP:  [name], MD  Cardiologist:  Carlyle Dolly, MD  Electrophysiologist:  None   Evaluation Performed:  Follow-Up Visit  Chief Complaint: 40-month visit  History of Present Illness:    Cheryl Abbott is a 74 y.o. female with past medical history of HTN, HLD, lower extremity edema, Stage 3 CKD, history of TIA and OSA (on CPAP) who presents for a 54-month follow-up telehealth visit.   She was last examined by Dr. Harl Bowie in 01/2019 and denied any recent chest pain or dyspnea at that time. She had been experiencing lower extremity edema and was taking Lasix 40mg  PRN. It was felt her edema was likely secondary to venous insufficiency and she had been unable to wear compression stockings, therefore she was continued on PRN Lasix at that time.   In talking with the patient today, she reports overall not feeling well as she received her second Moderna vaccination yesterday and has experienced chills and body  aches since. She did take Tylenol PM last night with improvement in her symptoms. She has not had a fever per her report.  She reports that her breathing has overall been stable and denies any specific orthopnea or PND. She does have chronic lower extremity edema but denies any acute changes in this. She has not utilized Lasix regularly as she experiences significant cramping with this. She was previously placed on potassium supplementation and says her cramping intensified. She has been unable to wear compression stockings but does elevate her lower extremities throughout the day. She denies any recent chest pain or palpitations.  She does not add salt to her meals but does consume occasional frozen meals along with deli meat. Consumes 7-8 16-ounce bottles of water on a daily basis.   The patient does not have symptoms concerning for COVID-19 infection (fever, chills, cough, or new shortness of breath).    Past Medical History:  Diagnosis Date  . Abnormal brain scan 02/07/2012  . Anemia   . Anxiety   . Anxiety disorder   . Arthritis   . Brain TIA 08/19/2015  . Cancer ([place])    acute myeloma  . 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 Chronic back pain   . Depression   . Fatty liver   . GERD (gastroesophageal reflux disease)   . Headache   . HEMORRHOIDS 03/15/2009   OCT 2014 HB 12. 9    . History of adenomatous polyp of colon    2008  . History of kidney stones   . Hypercalcemia   .  Hypercalcemia 11/30/2010  . Hyperlipidemia   . Hypertension   . Lactose intolerance in adult 02/01/2016  . Left ureteral stone   . Lipoma of arm 01/19/2015  . Lumbar disc disease with radiculopathy   . Nephrolithiasis   . Neuropathic pain of both feet 04/11/2016  . Neuropathy, peripheral   . Nocturnal hypoxia 02/04/2014   Study confirms this done 02/10/2014   . OSA treated with BiPAP    per study 2007  . RENAL CALCULUS 03/15/2009   Qualifier: Diagnosis of  By: Zeb Comfort    . Sciatica   .  Shoulder pain, bilateral   . Sigmoid diverticulosis   . TIA (transient ischemic attack) 02/26/2016  . Type 2 diabetes mellitus ([place])   . Vaginal dryness, menopausal 10/14/2016   Past Surgical History:  Procedure Laterality Date  . BIOPSY N/A 03/17/2013   Procedure: GASTRIC BIOPSIES;  Surgeon: Danie Binder, MD;  Location: [facility];  Service: Endoscopy;  Laterality: N/A;  . BIOPSY  06/10/2015   Procedure: BIOPSY;  Surgeon: Danie Binder, MD;  [facility];  Service: Endoscopy;;  gastric biopsy  . CARDIAC CATHETERIZATION  05-11-2003  dr Shelva Majestic ([facility])   Abnormal cardiolite/   Normal coronary arteries and normal LVF,  ef  63%  . CARDIOVASCULAR STRESS TEST  01-01-2014   normal lexiscan cardiolite/  no ischemia/ infarct/  normal LV function and wall motion ,  ef 81%  . COLONOSCOPY  last one 10/02/2011   MOD [place] TICS, IH, NEXT TCS APR 2018  . COLONOSCOPY WITH PROPOFOL N/A 02/26/2017   Procedure: COLONOSCOPY WITH PROPOFOL;  Surgeon: Danie Binder, MD;  [facility];  Service: Endoscopy;  Laterality: N/A;  11:00am  . CYSTO/   RIGHT URETEROSOCOPY LASER LITHOTRIPSY STONE EXTRACTION  10-13-2004  . CYSTO/  RIGHT RETROGRADE PYELOGRAM/  PLACMENT RIGHT URETERAL STENT  01-10-2010  . CYSTOSCOPY W/ URETERAL STENT PLACEMENT Right 08/19/2015   Procedure: CYSTOSCOPY WITH RETROGRADE PYELOGRAM/URETERAL STENT PLACEMENT;  Surgeon: Franchot Gallo, MD;  Location: [facility];  Service: Urology;  Laterality: Right;  . CYSTOSCOPY WITH RETROGRADE PYELOGRAM, URETEROSCOPY AND STENT PLACEMENT Left 10/21/2014   Procedure: CYSTOSCOPY WITH RETROGRADE PYELOGRAM, URETEROSCOPY AND STENT PLACEMENT;  Surgeon: Franchot Gallo, MD;  Location[facility];  Service: Urology;  Laterality: Left;  . CYSTOSCOPY WITH RETROGRADE PYELOGRAM, URETEROSCOPY AND STENT PLACEMENT Right 11/22/2015   Procedure: CYSTOSCOPY, RIGHT URETERAL STENT REMOVAL; RIGHT RETROGRADE PYELOGRAM, RIGHT  URETEROSCOPY WITH BALLOON DILATION; RIGHT URETERAL STENT PLACEMENT;  Surgeon: Franchot Gallo, MD;  Location[facility];  Service: Urology;  Laterality: Right;  . CYSTOSCOPY/RETROGRADE/URETEROSCOPY/STONE EXTRACTION WITH BASKET Bilateral 08/02/2015   Procedure: CYSTOSCOPY; BILATERAL RETROGRADE PYELOGRAMS; BILATERAL URETEROSCOPY, STONE EXTRACTION WITH BASKET;  Surgeon: Franchot Gallo, MD;  Location[facility];  Service: Urology;  Laterality: Bilateral;  . CYSTOSCOPY/RETROGRADE/URETEROSCOPY/STONE EXTRACTION WITH BASKET Right 06/28/2015   Procedure: CYSTOSCOPY/RETROGRADE/URETEROSCOPY/STONE EXTRACTION WITH BASKET, RIGHT URETERAL DOUBLE J STENT PLACEMENT;  Surgeon: Franchot Gallo, MD;  [facility];  Service: Urology;  Laterality: Right;  . ESOPHAGOGASTRODUODENOSCOPY (EGD) WITH PROPOFOL N/A 03/17/2013   Procedure: ESOPHAGOGASTRODUODENOSCOPY (EGD) WITH PROPOFOL;  Surgeon: Danie Binder, MD;  [facility];  Service: Endoscopy;  Laterality: N/A;  . ESOPHAGOGASTRODUODENOSCOPY (EGD) WITH PROPOFOL N/A 06/10/2015   Distal gastritis, distal esophageal stricture s/p dilation  . ESOPHAGOGASTRODUODENOSCOPY (EGD) WITH PROPOFOL N/A 02/26/2017   Procedure: ESOPHAGOGASTRODUODENOSCOPY (EGD) WITH PROPOFOL;  Surgeon: Danie Binder, MD;  [facility];  Service: Endoscopy;  Laterality: N/A;  .  FLEXIBLE SIGMOIDOSCOPY  09/11/2011   [id number] Internal hemorrhoids  . HOLMIUM LASER APPLICATION Left 07/31/2540   Procedure: HOLMIUM LASER APPLICATION;  Surgeon: Franchot Gallo, MD;  [facility];  Service: Urology;  Laterality: Left;  . HOLMIUM LASER APPLICATION Right 12/15/2374   Procedure: HOLMIUM LASER APPLICATION;  Surgeon: Franchot Gallo, MD;  Location[facility];  Service: Urology;  Laterality: Right;  . MASS EXCISION Left 03/04/2015   Procedure: EXCISION OF SOFT TISSUE NEOPLASM LEFT ARM;  Surgeon: Aviva Signs, MD;  Location: [facility];  Service: General;  Laterality: Left;  .  PERCUTANEOUS NEPHROSTOLITHOTOMY Bilateral 12/  2011     [facility]  . POLYPECTOMY N/A 03/17/2013   Procedure: GASTRIC POLYPECTOMY;  Surgeon: Danie Binder, MD;  [facility];  Service: Endoscopy;  Laterality: N/A;  . POLYPECTOMY  02/26/2017   Procedure: POLYPECTOMY;  Surgeon: Danie Binder, MD;  [facility];  Service: Endoscopy;;  polyp at cecum, ascending colon polyps x3, hepatic flexure polyps x8, transverse colon polyps x8   . REMOVAL RIGHT THIGH CYST  2006  . SAVORY DILATION N/A 03/17/2013   Procedure: SAVORY DILATION;  Surgeon: Danie Binder, MD;  Location: [facility];  Service: Endoscopy;  Laterality: N/A;  #12.8, 14, 15, 16 dilators used  . SAVORY DILATION N/A 06/10/2015   Procedure: SAVORY DILATION;  Surgeon: Danie Binder, MD;  [facility];  Service: Endoscopy;  Laterality: N/A;  . TRANSTHORACIC ECHOCARDIOGRAM  01-01-2014   mild LVH/  ef 28-31%/  grade I diastolic dysfunction/  trivial MR, TR, and PR  . VAGINAL HYSTERECTOMY  1970's     Current Meds  Medication Sig  . amLODipine (NORVASC) 10 MG tablet TAKE 1 TABLET BY MOUTH EVERY DAY  . aspirin EC 81 MG tablet Take 81 mg by mouth daily.  . budesonide-formoterol (SYMBICORT) 80-4.5 MCG/ACT inhaler INHALE 2 PUFFS INTO THE LUNGS TWICE A DAY  . diclofenac sodium (VOLTAREN) 1 % GEL Apply 4 g topically 4 (four) times daily.  Marland Kitchen lovastatin (MEVACOR) 40 MG tablet TAKE 1 TABLET AT BEDTIME  . Melatonin 10 MG TABS Take 10 mg by mouth as needed.   . pantoprazole (PROTONIX) 40 MG tablet TAKE 1 TABLET (40 MG TOTAL) BY MOUTH 2 (TWO) TIMES DAILY.  Marland Kitchen PARoxetine (PAXIL) 40 MG tablet TAKE 1 TABLET BY MOUTH EVERY DAY IN THE MORNING  . TOBRADEX ophthalmic ointment Place 1 application into the right eye 2 (two) times daily.  . TRADJENTA 5 MG TABS tablet TAKE 1 TABLET BY MOUTH EVERY DAY     Allergies:   Ace inhibitors, Keflex [cephalexin], Nitrofurantoin, and Penicillins   Social History   Tobacco Use  . Smoking status:  Former Smoker    Packs/day: 1.00    Years: 20.00    Pack years: 20.00    Types: Cigarettes    Start date: 09/21/1974    Quit date: 09/20/1988    Years since quitting: 30.9  . Smokeless tobacco: Never Used  . Tobacco comment: Quit x 20 years  Substance Use Topics  . Alcohol use: No    Alcohol/week: 0.0 standard drinks  . Drug use: No     Family Hx: The patient's family history includes Arthritis in an other family member; Cancer in an other family member; Dementia in her mother; Diabetes in her brother, mother, and another family member; Emphysema in her father; GER disease in her brother; Heart defect in an other family member; Heart failure in her mother; Hypertension  in her father, mother, sister, and sister. There is no history of Anesthesia problems, Hypotension, Malignant hyperthermia, Pseudochol deficiency, or Colon cancer.  ROS:   Please see the history of present illness.     All other systems reviewed and are negative.   Prior CV studies:   The following studies were reviewed today:  Echocardiogram: 03/2018 Study Conclusions   - Left ventricle: The cavity size was normal. Wall thickness was  increased in a pattern of mild LVH. Systolic function was normal.  The estimated ejection fraction was in the range of 55% to 60%.  Wall motion was normal; there were no regional wall motion  abnormalities. Indeterminate diastolic function.  - Right ventricle: The cavity size was mildly dilated.  - Right atrium: Central venous pressure (est): 3 mm Hg.  - Atrial septum: No defect or patent foramen ovale was identified.  - Tricuspid valve: There was trivial regurgitation.  - Pulmonary arteries: PA peak pressure: 21 mm Hg (S).  - Pericardium, extracardiac: There was no pericardial effusion.   Labs/Other Tests and Data Reviewed:    EKG:  An ECG dated 01/02/2019 was personally reviewed today and demonstrated:  NSR, HR 63 with no acute ST abnormalities.   Recent  Labs: 08/14/2019: ALT 19; BUN 14; Creat 1.86; Hemoglobin 12.9; Platelets 368; Potassium 4.6; Sodium 137   Recent Lipid Panel Lab Results  Component Value Date/Time   CHOL 162 11/19/2018 12:13 PM   TRIG 121 11/19/2018 12:13 PM   HDL 47 11/19/2018 12:13 PM   CHOLHDL 3.4 11/19/2018 12:13 PM   LDLCALC 91 11/19/2018 12:13 PM   LDLCALC 103 (H) 05/21/2018 01:08 PM    Wt Readings from Last 3 Encounters:  08/20/19 195 lb (88.5 kg)  08/14/19 195 lb 0.6 oz (88.5 kg)  07/23/19 200 lb 3.2 oz (90.8 kg)     Objective:    Vital Signs:  BP 140/79   Ht 5' 6.5" (1.689 m)   Wt 195 lb (88.5 kg)   BMI 31.00 kg/m    General: Pleasant female sounding in NAD Psych: Normal affect. Neuro: Alert and oriented X 3.  Lungs:  Resp regular and unlabored while talking on the phone.   ASSESSMENT & PLAN:    1. Lower Extremity Edema - She has chronic lower extremity edema which has been at baseline per her report. This was felt to be secondary to venous insufficiency as prior echocardiogram only showed mild LVH with a preserved EF. - Treatment options have been limited as she declines to take diuretic therapy due to having significant cramping with this and reports potassium supplementation caused worsening symptoms. - We reviewed the continued role of conservative measures including elevating her lower extremities along with limiting sodium intake to less than 2000 mg daily and reducing her fluid intake to less than 2 L/day. She has been unable to tolerate compression stockings in the past.  2. HTN - BP was slightly elevated at 140/79 when checked today but she reports her SBP is typically in the 110's to 130's. I encouraged her to continue to follow this and report back if SBP remains greater than 140. - Continue Amlodipine 10 mg daily.  3. HLD - Followed by PCP. FLP in 2020 showed total cholesterol 162, triglycerides 121, HDL 47 and LDL 91. Remains on Lovastatin 40 mg daily.  4. Stage 3 CKD - creatinine  stable at 1.86 by recent labs on 08/14/2019 which is similar to prior results from 03/2019. She does take Motrin occasionally and  I recommended she utilize Tylenol in place of this given her CKD.    COVID-19 Education: The signs and symptoms of COVID-19 were discussed with the patient and how to seek care for testing (follow up with PCP or arrange E-visit). The importance of social distancing was discussed today.  Time:   Today, I have spent 26 minutes with the patient with telehealth technology discussing the above problems.     Medication Adjustments/Labs and Tests Ordered: Current medicines are reviewed at length with the patient today.  Concerns regarding medicines are outlined above.   Tests Ordered: No orders of the defined types were placed in this encounter.   Medication Changes: No orders of the defined types were placed in this encounter.   Follow Up:  In Person in 1 year(s)  Signed, Erma Heritage, PA-C  08/20/2019 1:52 PM    [facility]

## 2019-08-20 NOTE — Patient Instructions (Signed)
Medication Instructions:  Your physician recommends that you continue on your current medications as directed. Please refer to the Current Medication list given to you today.  *If you need a refill on your cardiac medications before your next appointment, please call your pharmacy*   Lab Work: NONE   If you have labs (blood work) drawn today and your tests are completely normal, you will receive your results only by: . MyChart Message (if you have MyChart) OR . A paper copy in the mail If you have any lab test that is abnormal or we need to change your treatment, we will call you to review the results.   Testing/Procedures: NONE    Follow-Up: At [facility], you and your health needs are our priority.  As part of our continuing mission to provide you with exceptional heart care, we have created designated Provider Care Teams.  These Care Teams include your primary Cardiologist (physician) and Advanced Practice Providers (APPs -  Physician Assistants and Nurse Practitioners) who all work together to provide you with the care you need, when you need it.  We recommend signing up for the patient portal called "MyChart".  Sign up information is provided on this After Visit Summary.  MyChart is used to connect with patients for Virtual Visits (Telemedicine).  Patients are able to view lab/test results, encounter notes, upcoming appointments, etc.  Non-urgent messages can be sent to your provider as well.   To learn more about what you can do with MyChart, go to [email or web address].    Your next appointment:   1 year(s)  The format for your next appointment:   In Person  Provider:   Jonathan Branch, MD   Other Instructions Thank you for choosing [facility]!    

## 2019-08-25 ENCOUNTER — Ambulatory Visit (HOSPITAL_COMMUNITY): Payer: Medicare Other | Admitting: Physical Therapy

## 2019-08-25 ENCOUNTER — Other Ambulatory Visit: Payer: Self-pay

## 2019-08-25 ENCOUNTER — Encounter (HOSPITAL_COMMUNITY): Payer: Self-pay | Admitting: Physical Therapy

## 2019-08-25 DIAGNOSIS — M542 Cervicalgia: Secondary | ICD-10-CM | POA: Diagnosis not present

## 2019-08-25 DIAGNOSIS — M6281 Muscle weakness (generalized): Secondary | ICD-10-CM | POA: Diagnosis not present

## 2019-08-25 DIAGNOSIS — R262 Difficulty in walking, not elsewhere classified: Secondary | ICD-10-CM | POA: Diagnosis not present

## 2019-08-25 DIAGNOSIS — R296 Repeated falls: Secondary | ICD-10-CM

## 2019-08-25 NOTE — Therapy (Signed)
[place], [place], [address] Phone: [phone]   Fax:  [phone]  Physical Therapy Treatment  Patient Details  Name: Cheryl Abbott MRN: [id number] Date of Birth: [birth year] Referring Provider (PT): Tula Nakayama   Encounter Date: 08/25/2019  PT End of Session - 08/25/19 1441    Visit Number  6    Number of Visits  6    Date for PT Re-Evaluation  08/27/19    Authorization Type  [employer] medicare    Authorization Time Period  no auth required. POC dates 07/16/19 to 08/27/2019    Authorization - Visit Number  6    Authorization - Number of Visits  6    PT Start Time  0375    PT Stop Time  4360    PT Time Calculation (min)  38 min    Activity Tolerance  Patient limited by pain;Patient limited by fatigue;No increased pain   Reports pain reduced to 23/10 at EOS, was 5/10 at beginning of session   Behavior During Therapy  [facility] for tasks assessed/performed       Past Medical History:  Diagnosis Date  . Abnormal brain scan 02/07/2012  . Anemia   . Anxiety   . Anxiety disorder   . Arthritis   . Brain TIA 08/19/2015  . Cancer ([place])    acute myeloma  . 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 Chronic back pain   . Depression   . Fatty liver   . GERD (gastroesophageal reflux disease)   . Headache   . HEMORRHOIDS 03/15/2009   OCT 2014 HB 12. 9    . History of adenomatous polyp of colon    2008  . History of kidney stones   . Hypercalcemia   . Hypercalcemia 11/30/2010  . Hyperlipidemia   . Hypertension   . Lactose intolerance in adult 02/01/2016  . Left ureteral stone   . Lipoma of arm 01/19/2015  . Lumbar disc disease with radiculopathy   . Nephrolithiasis   . Neuropathic pain of both feet 04/11/2016  . Neuropathy, peripheral   . Nocturnal hypoxia 02/04/2014   Study confirms this done 02/10/2014   . OSA treated with BiPAP    per study 2007  . RENAL CALCULUS 03/15/2009   Qualifier: Diagnosis of  By:  Zeb Comfort    . Sciatica   . Shoulder pain, bilateral   . Sigmoid diverticulosis   . TIA (transient ischemic attack) 02/26/2016  . Type 2 diabetes mellitus ([place])   . Vaginal dryness, menopausal 10/14/2016    Past Surgical History:  Procedure Laterality Date  . BIOPSY N/A 03/17/2013   Procedure: GASTRIC BIOPSIES;  Surgeon: Danie Binder, MD;  Location: [facility];  Service: Endoscopy;  Laterality: N/A;  . BIOPSY  06/10/2015   Procedure: BIOPSY;  Surgeon: Danie Binder, MD;  [facility];  Service: Endoscopy;;  gastric biopsy  . CARDIAC CATHETERIZATION  05-11-2003  dr Shelva Majestic ([facility])   Abnormal cardiolite/   Normal coronary arteries and normal LVF,  ef  63%  . CARDIOVASCULAR STRESS TEST  01-01-2014   normal lexiscan cardiolite/  no ischemia/ infarct/  normal LV function and wall motion ,  ef 81%  . COLONOSCOPY  last one 10/02/2011   MOD [facility] TICS, IH, NEXT TCS APR 2018  . COLONOSCOPY WITH PROPOFOL N/A 02/26/2017   Procedure: COLONOSCOPY WITH PROPOFOL;  Surgeon: Danie Binder, MD;  [facility];  Service: Endoscopy;  Laterality: N/A;  11:00am  . CYSTO/   RIGHT URETEROSOCOPY LASER LITHOTRIPSY STONE EXTRACTION  10-13-2004  . CYSTO/  RIGHT RETROGRADE PYELOGRAM/  PLACMENT RIGHT URETERAL STENT  01-10-2010  . CYSTOSCOPY W/ URETERAL STENT PLACEMENT Right 08/19/2015   Procedure: CYSTOSCOPY WITH RETROGRADE PYELOGRAM/URETERAL STENT PLACEMENT;  Surgeon: Franchot Gallo, MD;  Location: [facility];  Service: Urology;  Laterality: Right;  . CYSTOSCOPY WITH RETROGRADE PYELOGRAM, URETEROSCOPY AND STENT PLACEMENT Left 10/21/2014   Procedure: CYSTOSCOPY WITH RETROGRADE PYELOGRAM, URETEROSCOPY AND STENT PLACEMENT;  Surgeon: Franchot Gallo, MD;  Location: [facility];  Service: Urology;  Laterality: Left;  . CYSTOSCOPY WITH RETROGRADE PYELOGRAM, URETEROSCOPY AND STENT PLACEMENT Right 11/22/2015   Procedure: CYSTOSCOPY, RIGHT URETERAL STENT REMOVAL; RIGHT  RETROGRADE PYELOGRAM, RIGHT URETEROSCOPY WITH BALLOON DILATION; RIGHT URETERAL STENT PLACEMENT;  Surgeon: Franchot Gallo, MD;  Location: [facility];  Service: Urology;  Laterality: Right;  . CYSTOSCOPY/RETROGRADE/URETEROSCOPY/STONE EXTRACTION WITH BASKET Bilateral 08/02/2015   Procedure: CYSTOSCOPY; BILATERAL RETROGRADE PYELOGRAMS; BILATERAL URETEROSCOPY, STONE EXTRACTION WITH BASKET;  Surgeon: Franchot Gallo, MD;  Location: [facility];  Service: Urology;  Laterality: Bilateral;  . CYSTOSCOPY/RETROGRADE/URETEROSCOPY/STONE EXTRACTION WITH BASKET Right 06/28/2015   Procedure: CYSTOSCOPY/RETROGRADE/URETEROSCOPY/STONE EXTRACTION WITH BASKET, RIGHT URETERAL DOUBLE J STENT PLACEMENT;  Surgeon: Franchot Gallo, MD;  [facility];  Service: Urology;  Laterality: Right;  . ESOPHAGOGASTRODUODENOSCOPY (EGD) WITH PROPOFOL N/A 03/17/2013   Procedure: ESOPHAGOGASTRODUODENOSCOPY (EGD) WITH PROPOFOL;  Surgeon: Danie Binder, MD;  [facility];  Service: Endoscopy;  Laterality: N/A;  . ESOPHAGOGASTRODUODENOSCOPY (EGD) WITH PROPOFOL N/A 06/10/2015   Distal gastritis, distal esophageal stricture s/p dilation  . ESOPHAGOGASTRODUODENOSCOPY (EGD) WITH PROPOFOL N/A 02/26/2017   Procedure: ESOPHAGOGASTRODUODENOSCOPY (EGD) WITH PROPOFOL;  Surgeon: Danie Binder, MD;  [facility];  Service: Endoscopy;  Laterality: N/A;  . FLEXIBLE SIGMOIDOSCOPY  09/11/2011   FIE:[id number] Internal hemorrhoids  . HOLMIUM LASER APPLICATION Left 09/24/6061   Procedure: HOLMIUM LASER APPLICATION;  Surgeon: Franchot Gallo, MD;  Location: [facility];  Service: Urology;  Laterality: Left;  . HOLMIUM LASER APPLICATION Right 0/16/0109   Procedure: HOLMIUM LASER APPLICATION;  Surgeon: Franchot Gallo, MD;  Location: [facility];  Service: Urology;  Laterality: Right;  . MASS EXCISION Left 03/04/2015   Procedure: EXCISION OF SOFT TISSUE NEOPLASM LEFT ARM;  Surgeon: Aviva Signs, MD;  Location: [facility];  Service: General;   Laterality: Left;  . PERCUTANEOUS NEPHROSTOLITHOTOMY Bilateral 12/  2011     [facility]  . POLYPECTOMY N/A 03/17/2013   Procedure: GASTRIC POLYPECTOMY;  Surgeon: Danie Binder, MD;  [facility];  Service: Endoscopy;  Laterality: N/A;  . POLYPECTOMY  02/26/2017   Procedure: POLYPECTOMY;  Surgeon: Danie Binder, MD;  [facility];  Service: Endoscopy;;  polyp at cecum, ascending colon polyps x3, hepatic flexure polyps x8, transverse colon polyps x8   . REMOVAL RIGHT THIGH CYST  2006  . SAVORY DILATION N/A 03/17/2013   Procedure: SAVORY DILATION;  Surgeon: Danie Binder, MD;  Location: [facility];  Service: Endoscopy;  Laterality: N/A;  #12.8, 14, 15, 16 dilators used  . SAVORY DILATION N/A 06/10/2015   Procedure: SAVORY DILATION;  Surgeon: Danie Binder, MD;  [facility];  Service: Endoscopy;  Laterality: N/A;  . TRANSTHORACIC ECHOCARDIOGRAM  01-01-2014   mild LVH/  ef 32-35%/  grade I diastolic dysfunction/  trivial MR, TR, and PR  . VAGINAL HYSTERECTOMY  1970's    There were no vitals filed for this visit.  Subjective  Assessment - 08/25/19 1403    Subjective  States that her stomach is still bothering herm the MD took her blood but they haven't told her what is wrong. States that her pain in her neck comes and goes and she doesn't know if it has gotten better. STates that it is a little swollen today in her neck/shoulder on the left side. States that when she does her exercises she can feel the pull on the left side.    Currently in Pain?  Yes    Pain Score  5     Pain Location  Abdomen    Pain Orientation  Right         [facility] PT Assessment - 08/25/19 0001      Assessment   Medical Diagnosis  Cervical spondylosis with radiculopathy    Referring Provider (PT)  Tula Nakayama      Observation/Other Assessments   Focus on Therapeutic Outcomes (FOTO)   31% limited   was 65% limited     AROM   Right Shoulder Flexion  150 Degrees   no pain    Right  Shoulder ABduction  150 Degrees    Left Shoulder Flexion  100 Degrees   pain   Left Shoulder ABduction  140 Degrees   pain    Cervical Flexion  45    Cervical Extension  45    Cervical - Right Rotation  70    Cervical - Left Rotation  50                   [facility] Adult PT Treatment/Exercise - 08/25/19 0001      Neck Exercises: Seated   Other Seated Exercise  self mobilization to bilateral upper paraspinals and trap muscles with tennis ball              PT Education - 08/25/19 1441    Education Details  in current presentation, foto score and what it means, reveiwed HEP, answered all questions about current/continued pain despite improvement in functional mobility.    Person(s) Educated  Patient    Methods  Explanation    Comprehension  Verbalized understanding       PT Short Term Goals - 08/25/19 1406      PT SHORT TERM GOAL #1   Title  Patient will be independent in HEP to improve functional outcomes.    Time  3    Period  Weeks    Status  Achieved    Target Date  08/06/19      PT SHORT TERM GOAL #2   Title  Patient will report at least 25% improvement in overall symptoms to improve QOL.    Baseline  70-80% better    Time  3    Period  Weeks    Status  Achieved    Target Date  08/06/19        PT Long Term Goals - 08/25/19 1422      PT LONG TERM GOAL #1   Title  Patient will demonstrate at least 60 degrees of cervical rotation in both directions to improve spinal mobility.    Time  6    Period  Weeks    Status  Partially Met      PT LONG TERM GOAL #2   Title  Patient will report at least 50% improvement in overall symptoms to improve QOL.    Baseline  70-80%    Time  6    Period  Days  Status  Achieved            Plan - 08/25/19 1440    Clinical Impression Statement  Patient present for a progress note on this date. She has made great progress since the start of physical therapy meeting 2 out of 2 short term goals and 1 out of 2  long term goals. improvement in cervical and shoulder ROM noted. Patient to see neurosurgeon tomorrow and requested today's notes be sent to him. Overall patient has improved but continues to have some pain in her neck and shoulder. Patient to discharge from physical therapy at this time to home exercise program but encouraged to discuss with patient continued discomfort that physical therapy has not alleviated.    Personal Factors and Comorbidities  Age;Comorbidity 1;Comorbidity 2;Comorbidity 3+;Transportation    Comorbidities  DB, Myeloma, HTN    Examination-Activity Limitations  Sleep;Sit;Bathing;Bed Mobility;Hygiene/Grooming;Carry;Squat;Transfers    Examination-Participation Restrictions  Cleaning;Driving;Meal Prep;Shop    Stability/Clinical Decision Making  Evolving/Moderate complexity    Rehab Potential  Poor    PT Frequency  1x / week    PT Duration  6 weeks    PT Treatment/Interventions  ADLs/Self Care Home Management;Cryotherapy;Electrical Stimulation;Moist Heat;Traction;Balance training;Therapeutic exercise;Therapeutic activities;Functional mobility training;Stair training;Gait training;Neuromuscular re-education;Patient/family education;Manual techniques;Passive range of motion    PT Next Visit Plan  patient to discharge from PT to HEP at this time.    PT Home Exercise Plan  07/23/2019: 3D cervical excursion and scapular retraction.; 3/4:  thoracic excursion, sitting upper trap stretch, wand shoulder flexion, abduction and ER. 3/16 self mobilization with lacrosse ball       Patient will benefit from skilled therapeutic intervention in order to improve the following deficits and impairments:  Pain, Decreased balance, Decreased mobility, Difficulty walking, Decreased range of motion, Decreased activity tolerance  Visit Diagnosis: Muscle weakness (generalized)  Cervicalgia  Difficulty in walking, not elsewhere classified  Repeated falls     Problem List Patient Active Problem  List   Diagnosis Date Noted  . Cervical spondylosis 07/13/2019  . Cervical spondylosis with radiculopathy 07/13/2019  . Muscle cramps 07/01/2019  . Depression, major, single episode, in partial remission ([place]) 03/13/2019  . Annual physical exam 03/12/2019  . Eyelid cyst, right 01/15/2019  . 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 Shoulder pain, left 02/28/2018  . Palpable mass of neck 02/28/2018  . Exertional dyspnea 02/28/2018  . Insomnia 11/09/2017  . Multiple myeloma ([place]) 03/15/2017  . Urinary frequency 02/28/2017  . 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 CKD (chronic kidney disease) stage 3, GFR 30-59 ml/min ([facility]) 07/12/2015  . Anemia 10/14/2014  . Lumbar back pain with radiculopathy affecting left lower extremity 05/11/2014  . Obesity (BMI 30.0-34.9) 01/02/2014  . Generalized anxiety disorder 05/20/2013  . Anxiety and depression 08/13/2012  . Thoracic or lumbosacral neuritis or radiculitis, unspecified 12/22/2010  . Abdominal bloating 09/08/2009  . Colon adenomas 05/11/2009  . GERD 03/15/2009  . FATTY LIVER DISEASE 03/15/2009  . Sleep apnea 09/15/2008  . 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 Hyperlipidemia LDL goal <100 09/17/2007  . Hypertension goal BP (blood pressure) < 130/80 09/17/2007   2:43 PM, 08/25/19 Jerene Pitch, DPT Physical Therapy with [facility]  [phone] office  [place] [address] [place], [place], [address] Phone: [phone]   Fax:  [phone]  Name: JAYLEIGH NOTARIANNI MRN: [id number] Date of Birth: 12/17/1945

## 2019-08-26 ENCOUNTER — Other Ambulatory Visit: Payer: Self-pay | Admitting: Family Medicine

## 2019-08-26 DIAGNOSIS — M5412 Radiculopathy, cervical region: Secondary | ICD-10-CM | POA: Diagnosis not present

## 2019-08-26 DIAGNOSIS — M5021 Other cervical disc displacement,  high cervical region: Secondary | ICD-10-CM | POA: Diagnosis not present

## 2019-08-26 DIAGNOSIS — M47812 Spondylosis without myelopathy or radiculopathy, cervical region: Secondary | ICD-10-CM | POA: Diagnosis not present

## 2019-08-26 DIAGNOSIS — M542 Cervicalgia: Secondary | ICD-10-CM | POA: Diagnosis not present

## 2019-08-31 ENCOUNTER — Other Ambulatory Visit: Payer: Self-pay | Admitting: Family Medicine

## 2019-09-08 ENCOUNTER — Ambulatory Visit (HOSPITAL_COMMUNITY): Payer: Medicare Other | Admitting: Physical Therapy

## 2019-09-10 IMAGING — DX DG HIP (WITH OR WITHOUT PELVIS) 3-4V BILAT
5 series · 5 of 5 positions shown · non-contrast
Comparison: 08/18/2015

CLINICAL DATA: Fall

EXAM:
DG HIP (WITH OR WITHOUT PELVIS) 3-4V BILAT

[hip ap (1 of 2)]
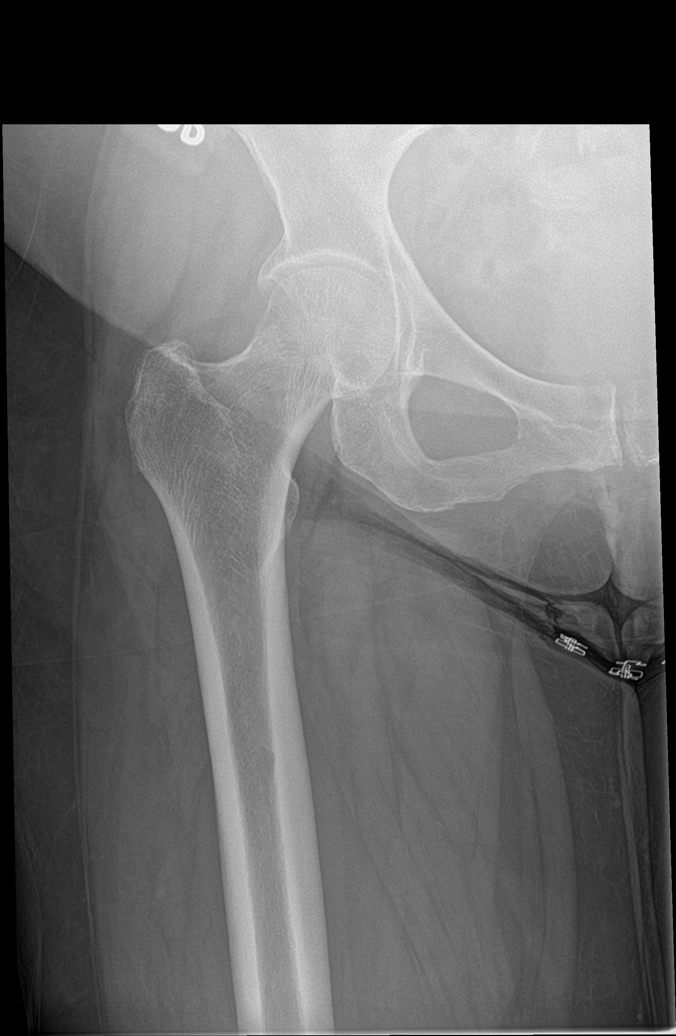

[hip lat (1 of 2)]
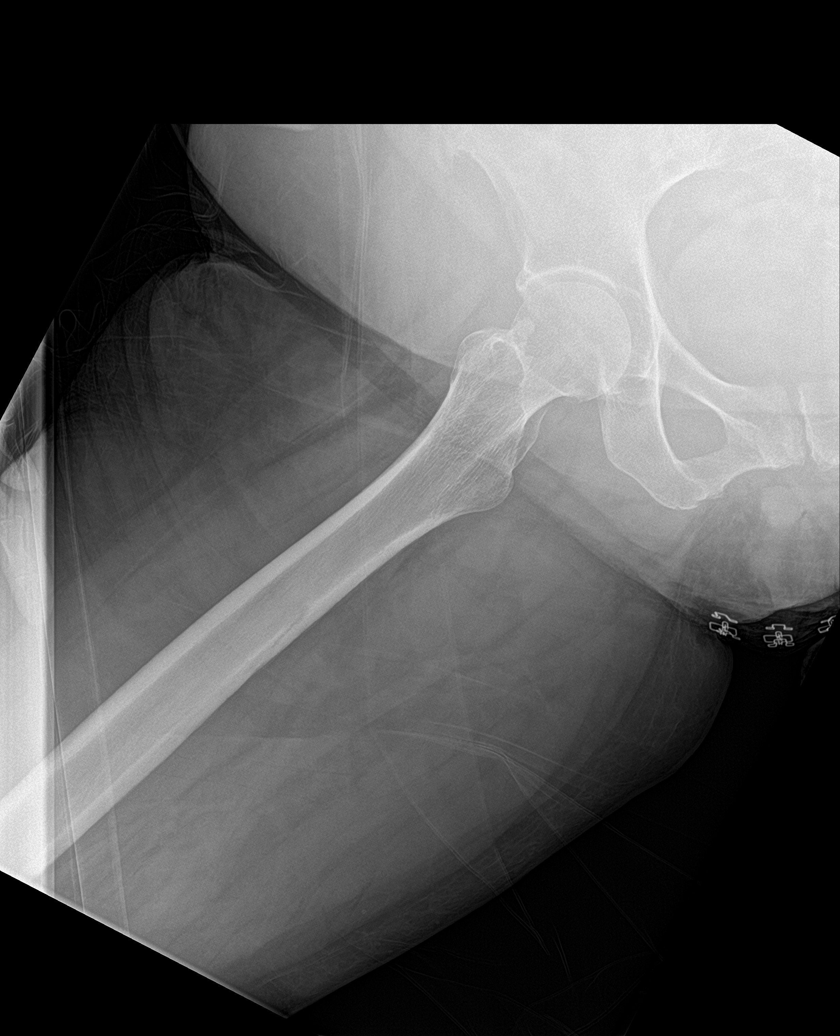

[hip ap (2 of 2)]
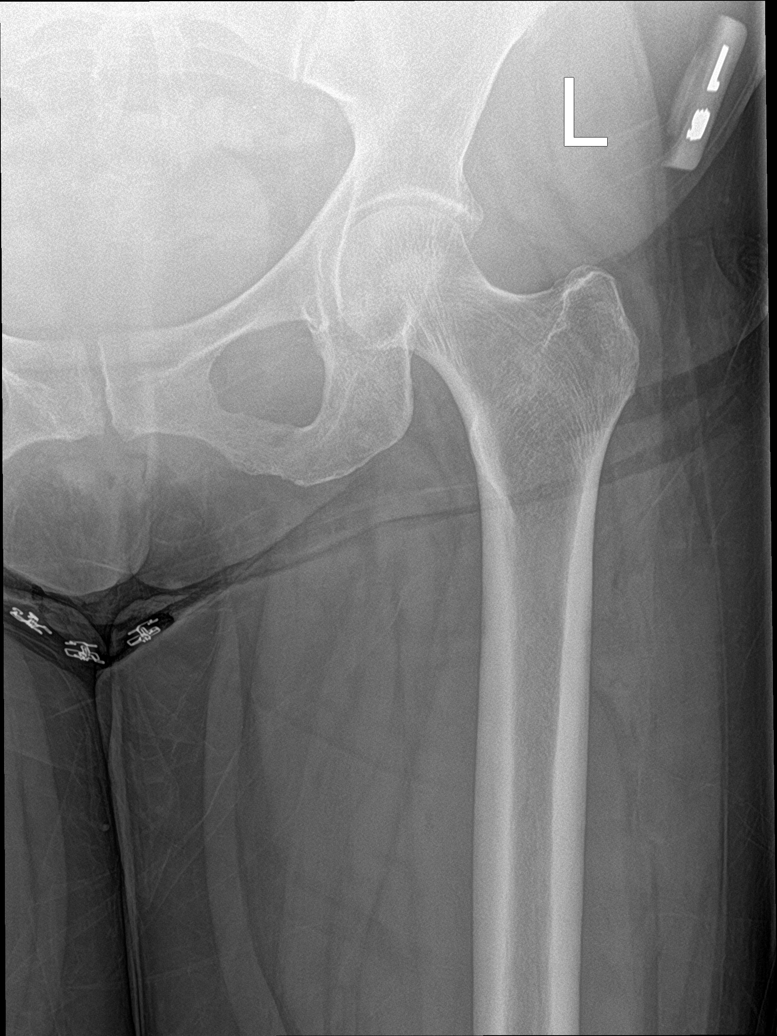

[pelvis ap]
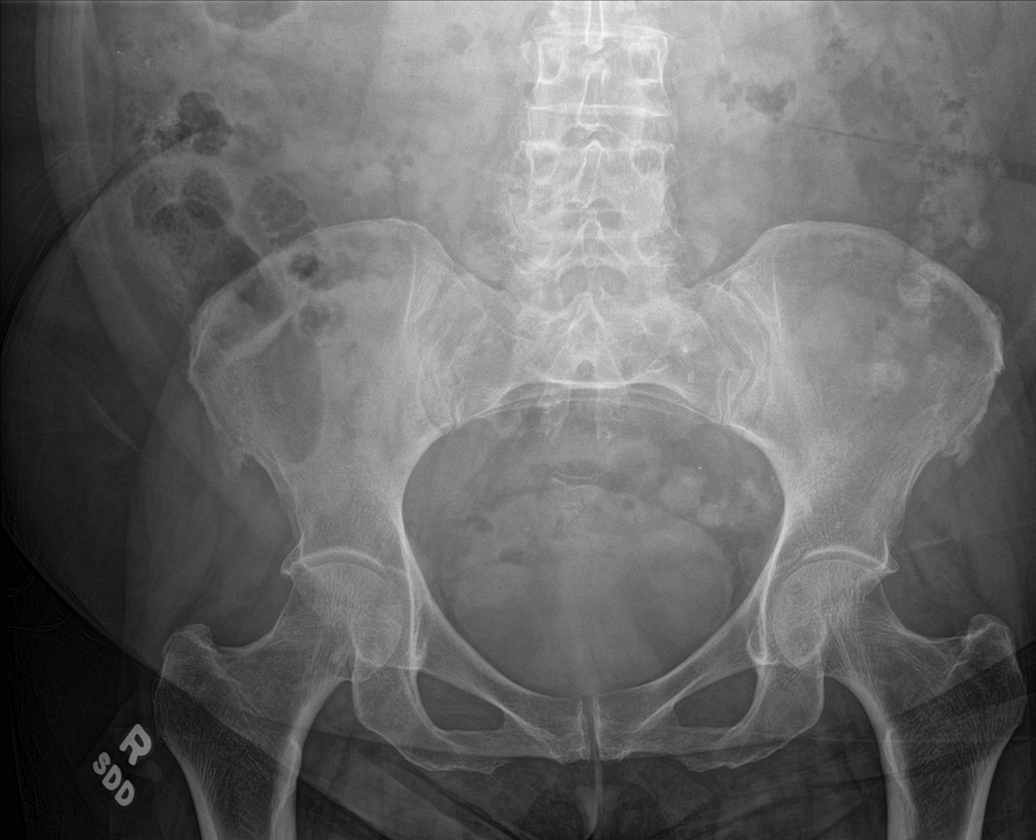

[hip lat (2 of 2)]
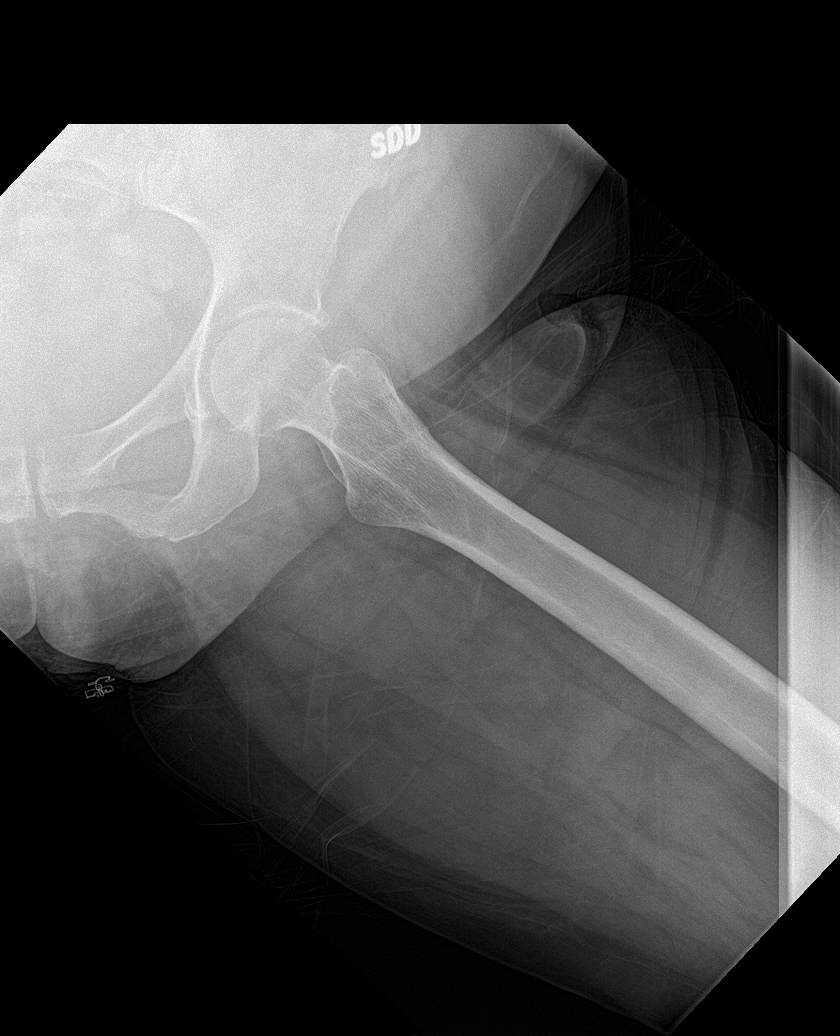

[5 of 5 positions shown; findings below may reference images not displayed]

FINDINGS: There is no evidence of hip fracture or dislocation. There is no
evidence of arthropathy or other focal bone abnormality.
IMPRESSION: Negative.

## 2019-09-10 IMAGING — DX DG LUMBAR SPINE COMPLETE 4+V
5 series · 5 of 5 positions shown · non-contrast
Comparison: CT 08/19/2015

CLINICAL DATA: Patient fell in parking lot landing on buttocks.
Bilateral buttock and arm pain.

EXAM:
LUMBAR SPINE - COMPLETE 4+ VIEW

[l-spine ap]
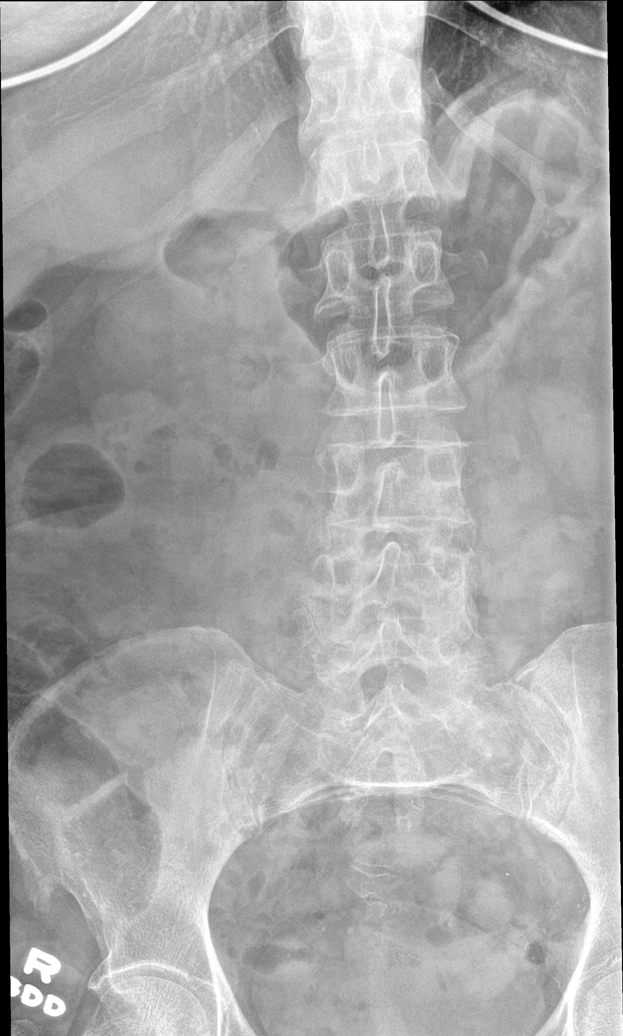

[l-spine obl (1 of 2)]
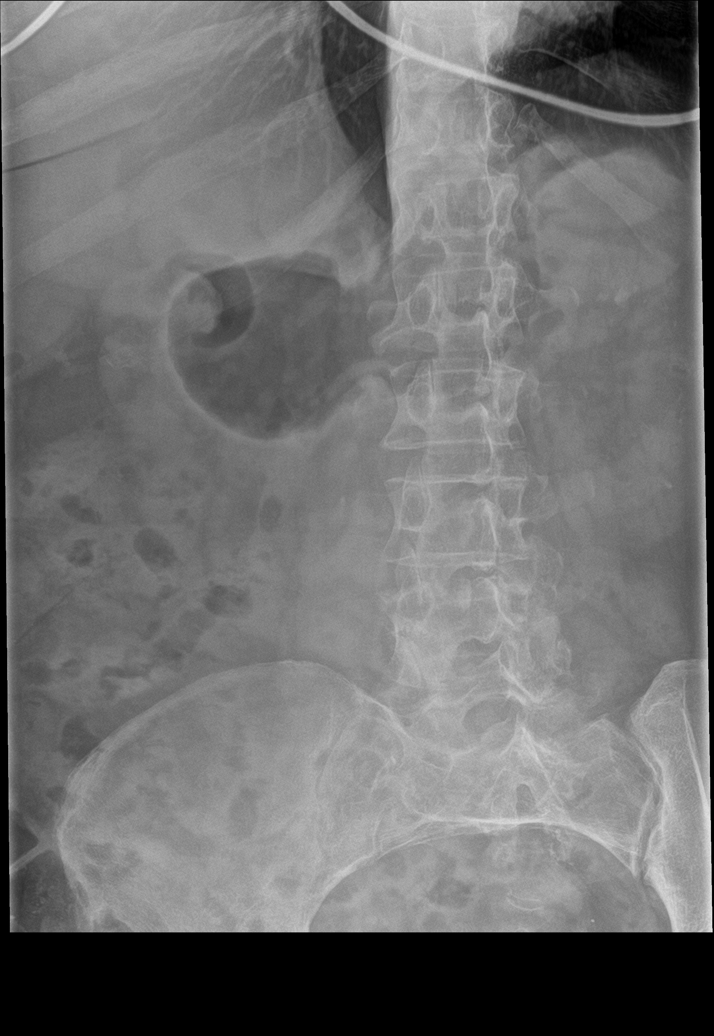

[l-spine obl (2 of 2)]
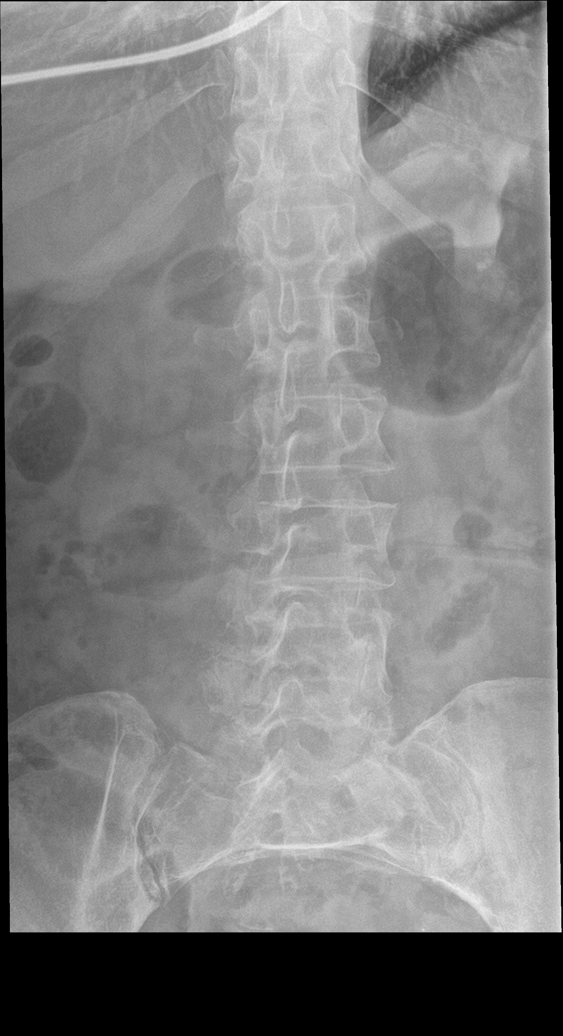

[l-spine spot]
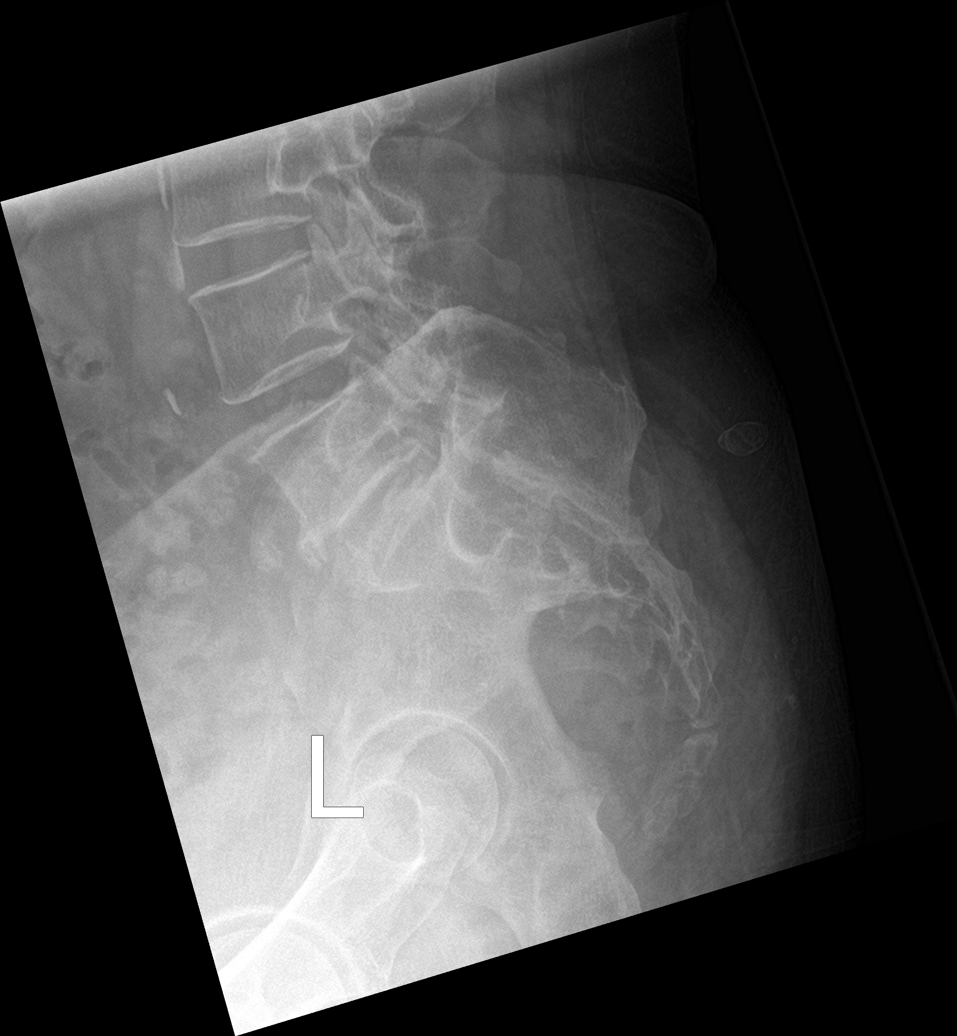

[l-spine lat]
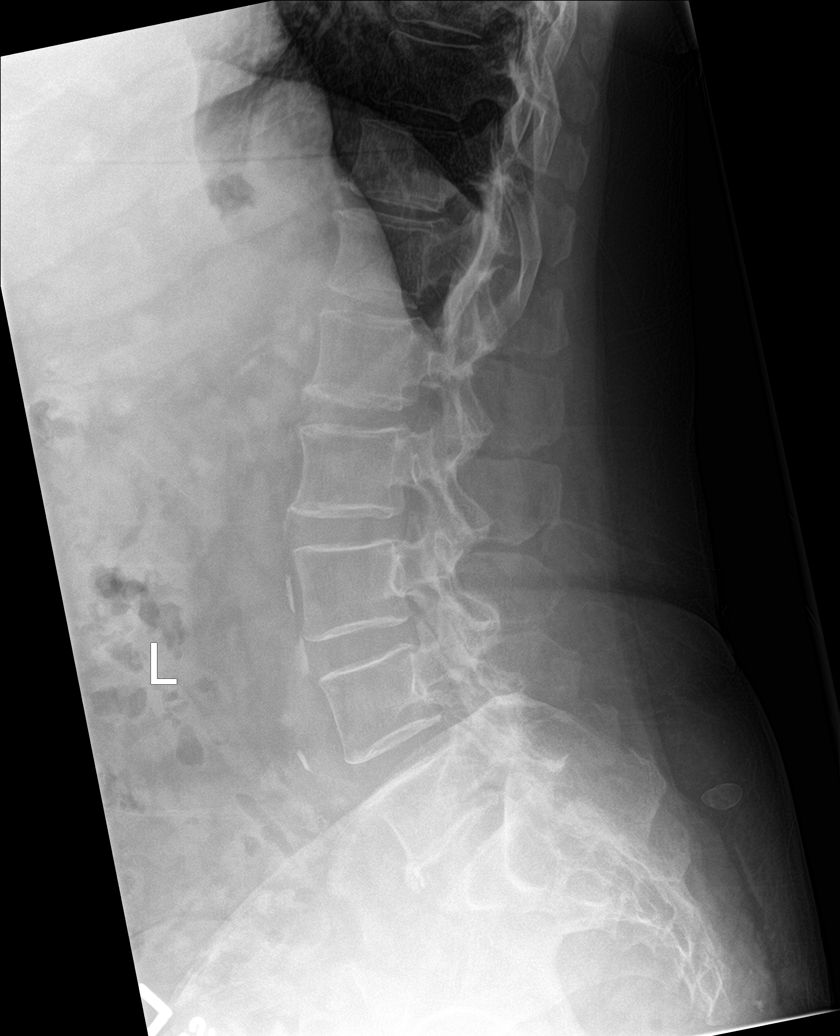

[5 of 5 positions shown; findings below may reference images not displayed]

FINDINGS: There are 5 non ribbed lumbar vertebrae. There is joint space
narrowing and sclerosis of the L3 through S1 facets. No pars
defects. No listhesis. No acute fracture of the lumbar spine.
Alignment is normal. Intervertebral disc spaces are maintained. The
included sacroiliac joints are maintained.
IMPRESSION: Lower lumbar degenerative facet arthropathy L3 through S1. No acute
osseous abnormality.

## 2019-09-21 ENCOUNTER — Other Ambulatory Visit (HOSPITAL_COMMUNITY)
Admission: RE | Admit: 2019-09-21 | Discharge: 2019-09-21 | Disposition: A | Discharge status: Home / Self Care | Payer: Medicare Other | Source: Ambulatory Visit | Attending: Gastroenterology | Admitting: Gastroenterology

## 2019-09-21 ENCOUNTER — Encounter (HOSPITAL_COMMUNITY)
Admission: RE | Admit: 2019-09-21 | Discharge: 2019-09-21 | Disposition: A | Discharge status: Home / Self Care | Payer: Medicare Other | Source: Ambulatory Visit | Attending: Gastroenterology | Admitting: Gastroenterology

## 2019-09-21 ENCOUNTER — Encounter (HOSPITAL_COMMUNITY): Payer: Self-pay

## 2019-09-21 ENCOUNTER — Other Ambulatory Visit: Payer: Self-pay

## 2019-09-21 DIAGNOSIS — Z01812 Encounter for preprocedural laboratory examination: Secondary | ICD-10-CM | POA: Insufficient documentation

## 2019-09-21 DIAGNOSIS — Z20822 Contact with and (suspected) exposure to covid-19: Secondary | ICD-10-CM | POA: Insufficient documentation

## 2019-09-21 LAB — SARS CORONAVIRUS 2 (TAT 6-24 HRS): SARS Coronavirus 2: NEGATIVE

## 2019-09-21 NOTE — Patient Instructions (Signed)
Your procedure is scheduled on: 09/22/2019  Report to [facility] at  11:45   AM.  Call this number if you have problems the morning of surgery: [phone]   Remember:              Follow Directions on the letter you received from Your Physician's office regarding the Bowel Prep              No Smoking the day of Procedure :   Take these medicines the morning of surgery with A SIP OF WATER: Amlodipine, protonix and paxil   Do not wear jewelry, make-up or nail polish.    Do not bring valuables to the hospital.  Contacts, dentures or bridgework may not be worn into surgery.  .   Patients discharged the day of surgery will not be allowed to drive home.     Colonoscopy, Adult, Care After This sheet gives you information about how to care for yourself after your procedure. Your health care provider may also give you more specific instructions. If you have problems or questions, contact your health care provider. What can I expect after the procedure? After the procedure, it is common to have:  A small amount of blood in your stool for 24 hours after the procedure.  Some gas.  Mild abdominal cramping or bloating.  Follow these instructions at home: General instructions   For the first 24 hours after the procedure: ? Do not drive or use machinery. ? Do not sign important documents. ? Do not drink alcohol. ? Do your regular daily activities at a slower pace than normal. ? Eat soft, easy-to-digest foods. ? Rest often.  Take over-the-counter or prescription medicines only as told by your health care provider.  It is up to you to get the results of your procedure. Ask your health care provider, or the department performing the procedure, when your results will be ready. Relieving cramping and bloating  Try walking around when you have cramps or feel bloated.  Apply heat to your abdomen as told by your health care provider. Use a heat source that your health care provider  recommends, such as a moist heat pack or a heating pad. ? Place a towel between your skin and the heat source. ? Leave the heat on for 20-30 minutes. ? Remove the heat if your skin turns bright red. This is especially important if you are unable to feel pain, heat, or cold. You may have a greater risk of getting burned. Eating and drinking  Drink enough fluid to keep your urine clear or pale yellow.  Resume your normal diet as instructed by your health care provider. Avoid heavy or fried foods that are hard to digest.  Avoid drinking alcohol for as long as instructed by your health care provider. Contact a health care provider if:  You have blood in your stool 2-3 days after the procedure. Get help right away if:  You have more than a small spotting of blood in your stool.  You pass large blood clots in your stool.  Your abdomen is swollen.  You have nausea or vomiting.  You have a fever.  You have increasing abdominal pain that is not relieved with medicine. This information is not intended to replace advice given to you by your health care provider. Make sure you discuss any questions you have with your health care provider. Document Released: 01/10/2004 Document Revised: 02/20/2016 Document Reviewed: 08/09/2015 Elsevier Interactive Patient Education  2018  [employer].

## 2019-09-22 ENCOUNTER — Ambulatory Visit (HOSPITAL_COMMUNITY): Payer: Medicare Other | Admitting: Anesthesiology

## 2019-09-22 ENCOUNTER — Encounter (HOSPITAL_COMMUNITY): Payer: Self-pay | Admitting: Gastroenterology

## 2019-09-22 ENCOUNTER — Ambulatory Visit (HOSPITAL_COMMUNITY)
Admission: RE | Admit: 2019-09-22 | Discharge: 2019-09-22 | Disposition: A | Discharge status: Home / Self Care | Payer: Medicare Other | Attending: Gastroenterology | Admitting: Gastroenterology

## 2019-09-22 ENCOUNTER — Encounter (HOSPITAL_COMMUNITY)
Admission: RE | Disposition: A | Discharge status: Home / Self Care | Payer: Self-pay | Source: Home / Self Care | Attending: Gastroenterology

## 2019-09-22 DIAGNOSIS — K635 Polyp of colon: Secondary | ICD-10-CM | POA: Diagnosis not present

## 2019-09-22 DIAGNOSIS — G709 Myoneural disorder, unspecified: Secondary | ICD-10-CM | POA: Insufficient documentation

## 2019-09-22 DIAGNOSIS — I10 Essential (primary) hypertension: Secondary | ICD-10-CM | POA: Diagnosis not present

## 2019-09-22 DIAGNOSIS — Z8261 Family history of arthritis: Secondary | ICD-10-CM | POA: Insufficient documentation

## 2019-09-22 DIAGNOSIS — R519 Headache, unspecified: Secondary | ICD-10-CM | POA: Diagnosis not present

## 2019-09-22 DIAGNOSIS — K5730 Diverticulosis of large intestine without perforation or abscess without bleeding: Secondary | ICD-10-CM | POA: Insufficient documentation

## 2019-09-22 DIAGNOSIS — Q438 Other specified congenital malformations of intestine: Secondary | ICD-10-CM | POA: Diagnosis not present

## 2019-09-22 DIAGNOSIS — K648 Other hemorrhoids: Secondary | ICD-10-CM | POA: Diagnosis not present

## 2019-09-22 DIAGNOSIS — Z833 Family history of diabetes mellitus: Secondary | ICD-10-CM | POA: Insufficient documentation

## 2019-09-22 DIAGNOSIS — I6930 Unspecified sequelae of cerebral infarction: Secondary | ICD-10-CM | POA: Insufficient documentation

## 2019-09-22 DIAGNOSIS — Z880 Allergy status to penicillin: Secondary | ICD-10-CM | POA: Insufficient documentation

## 2019-09-22 DIAGNOSIS — E785 Hyperlipidemia, unspecified: Secondary | ICD-10-CM | POA: Insufficient documentation

## 2019-09-22 DIAGNOSIS — K760 Fatty (change of) liver, not elsewhere classified: Secondary | ICD-10-CM | POA: Insufficient documentation

## 2019-09-22 DIAGNOSIS — F329 Major depressive disorder, single episode, unspecified: Secondary | ICD-10-CM | POA: Diagnosis not present

## 2019-09-22 DIAGNOSIS — K219 Gastro-esophageal reflux disease without esophagitis: Secondary | ICD-10-CM | POA: Insufficient documentation

## 2019-09-22 DIAGNOSIS — M1990 Unspecified osteoarthritis, unspecified site: Secondary | ICD-10-CM | POA: Diagnosis not present

## 2019-09-22 DIAGNOSIS — R0602 Shortness of breath: Secondary | ICD-10-CM | POA: Diagnosis not present

## 2019-09-22 DIAGNOSIS — D122 Benign neoplasm of ascending colon: Secondary | ICD-10-CM | POA: Insufficient documentation

## 2019-09-22 DIAGNOSIS — Z87442 Personal history of urinary calculi: Secondary | ICD-10-CM | POA: Insufficient documentation

## 2019-09-22 DIAGNOSIS — E1140 Type 2 diabetes mellitus with diabetic neuropathy, unspecified: Secondary | ICD-10-CM | POA: Insufficient documentation

## 2019-09-22 DIAGNOSIS — Z7982 Long term (current) use of aspirin: Secondary | ICD-10-CM | POA: Diagnosis not present

## 2019-09-22 DIAGNOSIS — Z86010 Personal history of colonic polyps: Secondary | ICD-10-CM | POA: Diagnosis not present

## 2019-09-22 DIAGNOSIS — E1142 Type 2 diabetes mellitus with diabetic polyneuropathy: Secondary | ICD-10-CM | POA: Diagnosis not present

## 2019-09-22 DIAGNOSIS — D649 Anemia, unspecified: Secondary | ICD-10-CM | POA: Insufficient documentation

## 2019-09-22 DIAGNOSIS — Z1211 Encounter for screening for malignant neoplasm of colon: Secondary | ICD-10-CM | POA: Insufficient documentation

## 2019-09-22 DIAGNOSIS — D125 Benign neoplasm of sigmoid colon: Secondary | ICD-10-CM | POA: Insufficient documentation

## 2019-09-22 DIAGNOSIS — Z791 Long term (current) use of non-steroidal anti-inflammatories (NSAID): Secondary | ICD-10-CM | POA: Insufficient documentation

## 2019-09-22 DIAGNOSIS — Z888 Allergy status to other drugs, medicaments and biological substances status: Secondary | ICD-10-CM | POA: Insufficient documentation

## 2019-09-22 DIAGNOSIS — Z8249 Family history of ischemic heart disease and other diseases of the circulatory system: Secondary | ICD-10-CM | POA: Insufficient documentation

## 2019-09-22 DIAGNOSIS — D124 Benign neoplasm of descending colon: Secondary | ICD-10-CM | POA: Diagnosis not present

## 2019-09-22 DIAGNOSIS — D123 Benign neoplasm of transverse colon: Secondary | ICD-10-CM | POA: Diagnosis not present

## 2019-09-22 DIAGNOSIS — Z90710 Acquired absence of both cervix and uterus: Secondary | ICD-10-CM | POA: Diagnosis not present

## 2019-09-22 DIAGNOSIS — K644 Residual hemorrhoidal skin tags: Secondary | ICD-10-CM | POA: Diagnosis not present

## 2019-09-22 DIAGNOSIS — F419 Anxiety disorder, unspecified: Secondary | ICD-10-CM | POA: Diagnosis not present

## 2019-09-22 DIAGNOSIS — Z8379 Family history of other diseases of the digestive system: Secondary | ICD-10-CM | POA: Insufficient documentation

## 2019-09-22 DIAGNOSIS — G4733 Obstructive sleep apnea (adult) (pediatric): Secondary | ICD-10-CM | POA: Insufficient documentation

## 2019-09-22 DIAGNOSIS — R252 Cramp and spasm: Secondary | ICD-10-CM | POA: Diagnosis not present

## 2019-09-22 DIAGNOSIS — D369 Benign neoplasm, unspecified site: Secondary | ICD-10-CM

## 2019-09-22 DIAGNOSIS — Z881 Allergy status to other antibiotic agents status: Secondary | ICD-10-CM | POA: Insufficient documentation

## 2019-09-22 DIAGNOSIS — Z79899 Other long term (current) drug therapy: Secondary | ICD-10-CM | POA: Insufficient documentation

## 2019-09-22 DIAGNOSIS — Z7951 Long term (current) use of inhaled steroids: Secondary | ICD-10-CM | POA: Insufficient documentation

## 2019-09-22 DIAGNOSIS — Z7984 Long term (current) use of oral hypoglycemic drugs: Secondary | ICD-10-CM | POA: Insufficient documentation

## 2019-09-22 DIAGNOSIS — F418 Other specified anxiety disorders: Secondary | ICD-10-CM | POA: Insufficient documentation

## 2019-09-22 DIAGNOSIS — Z87891 Personal history of nicotine dependence: Secondary | ICD-10-CM | POA: Insufficient documentation

## 2019-09-22 DIAGNOSIS — Z809 Family history of malignant neoplasm, unspecified: Secondary | ICD-10-CM | POA: Insufficient documentation

## 2019-09-22 HISTORY — PX: COLONOSCOPY WITH PROPOFOL: SHX5780

## 2019-09-22 HISTORY — PX: POLYPECTOMY: SHX5525

## 2019-09-22 LAB — GLUCOSE, CAPILLARY
Glucose-Capillary: 91 mg/dL (ref 70–99)
Glucose-Capillary: 93 mg/dL (ref 70–99)

## 2019-09-22 SURGERY — COLONOSCOPY WITH PROPOFOL
Anesthesia: General

## 2019-09-22 MED ORDER — LIDOCAINE HCL (CARDIAC) PF 100 MG/5ML IV SOSY
PREFILLED_SYRINGE | INTRAVENOUS | Status: DC | PRN
  Administered 2019-09-22: 50 mg via INTRATRACHEAL

## 2019-09-22 MED ORDER — MIDAZOLAM HCL 2 MG/2ML IJ SOLN
INTRAMUSCULAR | Status: AC
  Filled 2019-09-22: qty 2

## 2019-09-22 MED ORDER — PROPOFOL 10 MG/ML IV BOLUS
INTRAVENOUS | Status: DC | PRN
  Administered 2019-09-22: 20 mg via INTRAVENOUS

## 2019-09-22 MED ORDER — KETAMINE HCL 50 MG/5ML IJ SOSY
PREFILLED_SYRINGE | INTRAMUSCULAR | Status: AC
  Filled 2019-09-22: qty 5

## 2019-09-22 MED ORDER — CHLORHEXIDINE GLUCONATE CLOTH 2 % EX PADS: 6.0000 | MEDICATED_PAD | Freq: Once | CUTANEOUS | Status: DC

## 2019-09-22 MED ORDER — LACTATED RINGERS IV SOLN: INTRAVENOUS | Status: DC | PRN

## 2019-09-22 MED ORDER — PROPOFOL 500 MG/50ML IV EMUL
INTRAVENOUS | Status: DC | PRN
  Administered 2019-09-22: 125 ug/kg/min via INTRAVENOUS

## 2019-09-22 MED ORDER — GLYCOPYRROLATE 0.2 MG/ML IJ SOLN
INTRAMUSCULAR | Status: DC | PRN
  Administered 2019-09-22: .1 mg via INTRAVENOUS

## 2019-09-22 MED ORDER — KETAMINE HCL 10 MG/ML IJ SOLN
INTRAMUSCULAR | Status: DC | PRN
  Administered 2019-09-22: 10 mg via INTRAVENOUS

## 2019-09-22 MED ORDER — PHENYLEPHRINE 40 MCG/ML (10ML) SYRINGE FOR IV PUSH (FOR BLOOD PRESSURE SUPPORT)
PREFILLED_SYRINGE | INTRAVENOUS | Status: AC
  Filled 2019-09-22: qty 20

## 2019-09-22 MED ORDER — MIDAZOLAM HCL 2 MG/2ML IJ SOLN
2.0000 mg | Freq: Once | INTRAMUSCULAR | Status: AC
  Administered 2019-09-22: 2 mg via INTRAVENOUS

## 2019-09-22 MED ORDER — LACTATED RINGERS IV SOLN
Freq: Once | INTRAVENOUS | Status: AC
  Administered 2019-09-22: 1000 mL via INTRAVENOUS

## 2019-09-22 MED ORDER — EPHEDRINE 5 MG/ML INJ
INTRAVENOUS | Status: AC
  Filled 2019-09-22: qty 20

## 2019-09-22 NOTE — Anesthesia Postprocedure Evaluation (Signed)
Anesthesia Post Note  Patient: Cheryl Abbott  Procedure(s) Performed: COLONOSCOPY WITH PROPOFOL (N/A ) POLYPECTOMY  Patient location during evaluation: PACU Anesthesia Type: General Level of consciousness: awake and alert Pain management: pain level controlled Vital Signs Assessment: post-procedure vital signs reviewed and stable Respiratory status: spontaneous breathing Cardiovascular status: stable Postop Assessment: no apparent nausea or vomiting Anesthetic complications: no     Last Vitals:  Vitals:   09/22/19 1222  BP: 121/84  Resp: 20  Temp: 36.7 C  SpO2: 96%    Last Pain:  Vitals:   09/22/19 1321  TempSrc:   PainSc: 0-No pain                 [name] Iannello Hristova

## 2019-09-22 NOTE — H&P (Signed)
Primary Care Physician:  [name], MD Primary Gastroenterologist:  Dr. Oneida Alar  Pre-Procedure History & Physical: HPI:  Cheryl Abbott is a 74 y.o. female here for  PERSONAL HISTORY OF POLYPS.  Past Medical History:  Diagnosis Date  . Abnormal brain scan 02/07/2012  . Anemia   . Anxiety   . Anxiety disorder   . Arthritis   . Brain TIA 08/19/2015  . Cancer ([place])    acute myeloma  . 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 Chronic back pain   . Depression   . Fatty liver   . GERD (gastroesophageal reflux disease)   . Headache   . HEMORRHOIDS 03/15/2009   OCT 2014 HB 12. 9    . History of adenomatous polyp of colon    2008  . History of kidney stones   . Hypercalcemia   . Hypercalcemia 11/30/2010  . Hyperlipidemia   . Hypertension   . Lactose intolerance in adult 02/01/2016  . Left ureteral stone   . Lipoma of arm 01/19/2015  . Lumbar disc disease with radiculopathy   . Nephrolithiasis   . Neuropathic pain of both feet 04/11/2016  . Neuropathy, peripheral   . Nocturnal hypoxia 02/04/2014   Study confirms this done 02/10/2014   . OSA treated with BiPAP    per study 2007  . RENAL CALCULUS 03/15/2009   Qualifier: Diagnosis of  By: Zeb Comfort    . Sciatica   . Shoulder pain, bilateral   . Sigmoid diverticulosis   . TIA (transient ischemic attack) 02/26/2016  . Type 2 diabetes mellitus ([place])   . Vaginal dryness, menopausal 10/14/2016    Past Surgical History:  Procedure Laterality Date  . BIOPSY N/A 03/17/2013   Procedure: GASTRIC BIOPSIES;  Surgeon: Danie Binder, MD;  Location: [facility];  Service: Endoscopy;  Laterality: N/A;  . BIOPSY  06/10/2015   Procedure: BIOPSY;  Surgeon: Danie Binder, MD;  [facility];  Service: Endoscopy;;  gastric biopsy  . CARDIAC CATHETERIZATION  05-11-2003  dr Shelva Majestic ([facility])   Abnormal cardiolite/   Normal coronary arteries and normal LVF,  ef  63%  . CARDIOVASCULAR STRESS TEST  01-01-2014    normal lexiscan cardiolite/  no ischemia/ infarct/  normal LV function and wall motion ,  ef 81%  . COLONOSCOPY  last one 10/02/2011   [facility] [place] TICS, IH, NEXT TCS APR 2018  . COLONOSCOPY WITH PROPOFOL N/A 02/26/2017   Procedure: COLONOSCOPY WITH PROPOFOL;  Surgeon: Danie Binder, MD;  [facility];  Service: Endoscopy;  Laterality: N/A;  11:00am  . CYSTO/   RIGHT URETEROSOCOPY LASER LITHOTRIPSY STONE EXTRACTION  10-13-2004  . CYSTO/  RIGHT RETROGRADE PYELOGRAM/  PLACMENT RIGHT URETERAL STENT  01-10-2010  . CYSTOSCOPY W/ URETERAL STENT PLACEMENT Right 08/19/2015   Procedure: CYSTOSCOPY WITH RETROGRADE PYELOGRAM/URETERAL STENT PLACEMENT;  Surgeon: Franchot Gallo, MD;  Location: [facility];  Service: Urology;  Laterality: Right;  . CYSTOSCOPY WITH RETROGRADE PYELOGRAM, URETEROSCOPY AND STENT PLACEMENT Left 10/21/2014   Procedure: CYSTOSCOPY WITH RETROGRADE PYELOGRAM, URETEROSCOPY AND STENT PLACEMENT;  Surgeon: Franchot Gallo, MD;  Location: [facility]  Service: Urology;  Laterality: Left;  . CYSTOSCOPY WITH RETROGRADE PYELOGRAM, URETEROSCOPY AND STENT PLACEMENT Right 11/22/2015   Procedure: CYSTOSCOPY, RIGHT URETERAL STENT REMOVAL; RIGHT RETROGRADE PYELOGRAM, RIGHT URETEROSCOPY WITH BALLOON DILATION; RIGHT URETERAL STENT PLACEMENT;  Surgeon: Franchot Gallo, MD;  Location: [facility];  Service: Urology;  Laterality: Right;  . CYSTOSCOPY/RETROGRADE/URETEROSCOPY/STONE EXTRACTION WITH  BASKET Bilateral 08/02/2015   Procedure: CYSTOSCOPY; BILATERAL RETROGRADE PYELOGRAMS; BILATERAL URETEROSCOPY, STONE EXTRACTION WITH BASKET;  Surgeon: Franchot Gallo, MD;  Location: [facility];  Service: Urology;  Laterality: Bilateral;  . CYSTOSCOPY/RETROGRADE/URETEROSCOPY/STONE EXTRACTION WITH BASKET Right 06/28/2015   Procedure: CYSTOSCOPY/RETROGRADE/URETEROSCOPY/STONE EXTRACTION WITH BASKET, RIGHT URETERAL DOUBLE J STENT PLACEMENT;  Surgeon: Franchot Gallo, MD;  Location: [facility];  Service:  Urology;  Laterality: Right;  . ESOPHAGOGASTRODUODENOSCOPY (EGD) WITH PROPOFOL N/A 03/17/2013   Procedure: ESOPHAGOGASTRODUODENOSCOPY (EGD) WITH PROPOFOL;  Surgeon: Danie Binder, MD;  Location: [facility];  Service: Endoscopy;  Laterality: N/A;  . ESOPHAGOGASTRODUODENOSCOPY (EGD) WITH PROPOFOL N/A 06/10/2015   Distal gastritis, distal esophageal stricture s/p dilation  . ESOPHAGOGASTRODUODENOSCOPY (EGD) WITH PROPOFOL N/A 02/26/2017   Procedure: ESOPHAGOGASTRODUODENOSCOPY (EGD) WITH PROPOFOL;  Surgeon: Danie Binder, MD;  [facility];  Service: Endoscopy;  Laterality: N/A;  . FLEXIBLE SIGMOIDOSCOPY  09/11/2011   SWN:[id number] Internal hemorrhoids  . HOLMIUM LASER APPLICATION Left 0/93/8182   Procedure: HOLMIUM LASER APPLICATION;  Surgeon: Franchot Gallo, MD;  Location: [facility];  Service: Urology;  Laterality: Left;  . HOLMIUM LASER APPLICATION Right 9/93/7169   Procedure: HOLMIUM LASER APPLICATION;  Surgeon: Franchot Gallo, MD;  Location: [facility];  Service: Urology;  Laterality: Right;  . MASS EXCISION Left 03/04/2015   Procedure: EXCISION OF SOFT TISSUE NEOPLASM LEFT ARM;  Surgeon: Aviva Signs, MD;  Location: [facility];  Service: General;  Laterality: Left;  . PERCUTANEOUS NEPHROSTOLITHOTOMY Bilateral 12/  2011     [facility]  . POLYPECTOMY N/A 03/17/2013   Procedure: GASTRIC POLYPECTOMY;  Surgeon: Danie Binder, MD;  Location: [facility];  Service: Endoscopy;  Laterality: N/A;  . POLYPECTOMY  02/26/2017   Procedure: POLYPECTOMY;  Surgeon: Danie Binder, MD;  [facility];  Service: Endoscopy;;  polyp at cecum, ascending colon polyps x3, hepatic flexure polyps x8, transverse colon polyps x8   . REMOVAL RIGHT THIGH CYST  2006  . SAVORY DILATION N/A 03/17/2013   Procedure: SAVORY DILATION;  Surgeon: Danie Binder, MD;  Location: [facility];  Service: Endoscopy;  Laterality: N/A;  #12.8, 14, 15, 16 dilators used  . SAVORY DILATION N/A 06/10/2015   Procedure: SAVORY  DILATION;  Surgeon: Danie Binder, MD;  [facility];  Service: Endoscopy;  Laterality: N/A;  . TRANSTHORACIC ECHOCARDIOGRAM  01-01-2014   mild LVH/  ef 67-89%/  grade I diastolic dysfunction/  trivial MR, TR, and PR  . VAGINAL HYSTERECTOMY  1970's    Prior to Admission medications   Medication Sig Start Date End Date Taking? Authorizing Provider  acetaminophen (TYLENOL) 500 MG tablet Take 1,000 mg by mouth every 6 (six) hours as needed (for pain.).   Yes [provider]  amLODipine (NORVASC) 10 MG tablet TAKE 1 TABLET BY MOUTH EVERY DAY Patient taking differently: Take 10 mg by mouth daily.  08/03/19 11/01/19 Yes [name], MD  aspirin EC 81 MG tablet Take 81 mg by mouth daily.   Yes [provider]  budesonide-formoterol (SYMBICORT) 80-4.5 MCG/ACT inhaler INHALE 2 PUFFS INTO THE LUNGS TWICE A DAY Patient taking differently: Inhale 2 puffs into the lungs in the morning and at bedtime.  03/25/19  Yes [name], MD  Cholecalciferol (VITAMIN D3) 50 MCG (2000 UT) TABS Take 2,000 Units by mouth daily.   Yes [provider]  diclofenac sodium (VOLTAREN) 1 % GEL Apply 4 g topically 4 (four) times daily. Patient taking differently: Apply 4 g topically  4 (four) times daily as needed (shoulder/neck/back pain.).  03/02/19  Yes Carole Civil, MD  diphenhydramine-acetaminophen (TYLENOL PM) 25-500 MG TABS tablet Take 2 tablets by mouth at bedtime.   Yes [provider]  lovastatin (MEVACOR) 40 MG tablet TAKE 1 TABLET AT BEDTIME Patient taking differently: Take 40 mg by mouth at bedtime.  08/26/19  Yes [name], MD  Melatonin 10 MG TABS Take 10 mg by mouth at bedtime as needed (sleep.).    Yes [provider]  pantoprazole (PROTONIX) 40 MG tablet TAKE 1 TABLET (40 MG TOTAL) BY MOUTH 2 (TWO) TIMES DAILY. 03/24/19  Yes [name], MD  PARoxetine (PAXIL) 40 MG tablet TAKE 1 TABLET BY MOUTH EVERY MORNING Patient  taking differently: Take 40 mg by mouth daily.  08/31/19  Yes [name], MD  TRADJENTA 5 MG TABS tablet TAKE 1 TABLET BY MOUTH EVERY DAY Patient taking differently: Take 5 mg by mouth daily.  08/26/19  Yes [name], MD    Allergies as of 07/23/2019 - Review Complete 07/23/2019  Allergen Reaction Noted  . Ace inhibitors Cough 12/11/2010  . Keflex [cephalexin] Diarrhea and Nausea And Vomiting 07/24/2011  . Nitrofurantoin Diarrhea and Nausea And Vomiting 09/08/2009  . Penicillins Hives and Other (See Comments) 09/17/2007    Family History  Problem Relation Age of Onset  . Hypertension Mother   . Diabetes Mother   . Heart failure Mother   . Dementia Mother   . Emphysema Father   . Hypertension Father   . Diabetes Brother   . GER disease Brother   . Hypertension Sister   . Hypertension Sister   . Cancer Other        family history   . Diabetes Other        family history   . Heart defect Other        famiily history   . Arthritis Other        family history   . Anesthesia problems Neg Hx   . Hypotension Neg Hx   . Malignant hyperthermia Neg Hx   . Pseudochol deficiency Neg Hx   . Colon cancer Neg Hx     Social History   Socioeconomic History  . Marital status: Divorced    Spouse name: Not on file  . Number of children: 2  . Years of education: Not on file  . Highest education level: Not on file  Occupational History  . Occupation: retired   Tobacco Use  . Smoking status: Former Smoker    Packs/day: 1.00    Years: 20.00    Pack years: 20.00    Types: Cigarettes    Start date: 09/21/1974    Quit date: 09/20/1988    Years since quitting: 31.0  . Smokeless tobacco: Never Used  . Tobacco comment: Quit x 20 years  Substance and Sexual Activity  . Alcohol use: No    Alcohol/week: 0.0 standard drinks  . Drug use: No  . Sexual activity: Never    Birth control/protection: Surgical  Other Topics Concern  . Not on file  Social History Narrative    Patient is right handed   Patient drinks some caffeine daily.   Social Determinants of Health   Financial Resource Strain:   . Difficulty of Paying Living Expenses:   Food Insecurity:   . Worried About Charity fundraiser in the Last Year:   . [place] in the Last Year:  Transportation Needs:   . Film/video editor (Medical):   Marland Kitchen Lack of Transportation (Non-Medical):   Physical Activity:   . Days of Exercise per Week:   . Minutes of Exercise per Session:   Stress:   . Feeling of Stress :   Social Connections:   . Frequency of Communication with Friends and Family:   . Frequency of Social Gatherings with Friends and Family:   . Attends Religious Services:   . Active Member of Clubs or Organizations:   . Attends Archivist Meetings:   Marland Kitchen Marital Status:   Intimate Partner Violence:   . Fear of Current or Ex-Partner:   . Emotionally Abused:   Marland Kitchen Physically Abused:   . Sexually Abused:     Review of Systems: See HPI, otherwise negative ROS   Physical Exam: BP 121/84   Temp 98 F (36.7 C) (Oral)   Resp 20   SpO2 96%  General:   Alert,  pleasant and cooperative in NAD Head:  Normocephalic and atraumatic. Neck:  Supple; Lungs:  Clear throughout to auscultation.    Heart:  Regular rate and rhythm. Abdomen:  Soft, nontender and nondistended. Normal bowel sounds, without guarding, and without rebound.   Neurologic:  Alert and  oriented x4;  grossly normal neurologically.  Impression/Plan:      PERSONAL HISTORY OF POLYPS.  PLAN: 1. TCS TODAY. DISCUSSED PROCEDURE, BENEFITS, & RISKS: < 1% chance of medication reaction, bleeding, perforation, ASPIRATION, or rupture of spleen/liver requiring surgery to fix it and missed polyps < 1 cm 10-20% of the time.

## 2019-09-22 NOTE — Op Note (Signed)
[facility] Patient Name: Cheryl Abbott Procedure Date: 09/22/2019 1:09 PM MRN: [id number] Date of Birth: 02-03-1946 Attending MD: Barney Drain MD, MD CSN: [id number] Age: 74 Admit Type: Outpatient Procedure:                Colonoscopy WITH COLD SNARE/SNARE CAUTERY                            POLYPECTOMY Indications:              Personal history of colonic polyps Providers:                Barney Drain MD, MD, Otis Peak B. Sharon Seller, RN, Rosina Lowenstein, RN Referring MD:             Norwood Levo. Simpson MD, MD Medicines:                Propofol per Anesthesia Complications:            No immediate complications. Estimated Blood Loss:     Estimated blood loss was minimal. Procedure:                Pre-Anesthesia Assessment:                           - Prior to the procedure, a History and Physical                            was performed, and patient medications and                            allergies were reviewed. The patient's tolerance of                            previous anesthesia was also reviewed. The risks                            and benefits of the procedure and the sedation                            options and risks were discussed with the patient.                            All questions were answered, and informed consent                            was obtained. Prior Anticoagulants: The patient has                            taken no previous anticoagulant or antiplatelet                            agents except for aspirin. ASA Grade Assessment: II                            -  A patient with mild systemic disease. After                            reviewing the risks and benefits, the patient was                            deemed in satisfactory condition to undergo the                            procedure. After obtaining informed consent, the                            colonoscope was passed under direct vision.    Throughout the procedure, the patient's blood                            pressure, pulse, and oxygen saturations were                            monitored continuously. The PCF-[id number] ([id number])                            scope was introduced through the anus and advanced                            to the the cecum, identified by appendiceal orifice                            and ileocecal valve. The colonoscopy was somewhat                            difficult due to a tortuous colon. Successful                            completion of the procedure was aided by                            straightening and shortening the scope to obtain                            bowel loop reduction and COLOWRAP. The patient                            tolerated the procedure well. The quality of the                            bowel preparation was good. The ileocecal valve,                            appendiceal orifice, and rectum were photographed. Scope In: 1:40:59 PM Scope Out: 2:12:51 PM Scope Withdrawal Time: 0 hours 28 minutes 49 seconds  Total Procedure Duration: 0 hours 31 minutes 52 seconds  Findings:      Six sessile polyps were found in the transverse  colon(3) and ascending       colon. The polyps were 3 to 5 mm in size. These polyps were removed with       a cold snare. Resection and retrieval were complete.      Three sessile, non-bleeding polyps were found in the sigmoid colon(2)       and descending colon. The polyps were 3 to 5 mm in size. These polyps       were removed with a hot snare. Resection and retrieval were complete.       These polyps were removed with a cold snare. Resection and retrieval       were complete.      Multiple small and large-mouthed diverticula were found in the       recto-sigmoid colon, sigmoid colon, descending colon, splenic flexure       and hepatic flexure.      External and internal hemorrhoids were found.      The recto-sigmoid colon, sigmoid colon  and descending colon were       moderately tortuous. Impression:               - Six 3 to 5 mm polyps in the transverse colon and                            in the ascending colon, removed with a cold snare.                            Resected and retrieved.                           - Three 3 to 5 mm, non-bleeding polyps in the                            sigmoid colon and in the descending colon, removed                            with a hot snare and removed with a cold snare.                            Resected and retrieved.                           - Diverticulosis in the recto-sigmoid colon, in the                            sigmoid colon, in the descending colon, at the                            splenic flexure and at the hepatic flexure.                           - External and internal hemorrhoids.                           - Tortuous colon. Moderate Sedation:      Per Anesthesia Care Recommendation:           -  Patient has a contact number available for                            emergencies. The signs and symptoms of potential                            delayed complications were discussed with the                            patient. Return to normal activities tomorrow.                            Written discharge instructions were provided to the                            patient.                           - High fiber diet.                           - Continue present medications.                           - Await pathology results.                           - Repeat colonoscopy is not recommended due to                            current age (55 years or older) for surveillance. Procedure Code(s):        --- Professional ---                           [phone], Colonoscopy, flexible; with removal of                            tumor(s), polyp(s), or other lesion(s) by snare                            technique Diagnosis Code(s):        --- Professional ---                            K63.5, Polyp of colon                           K64.8, Other hemorrhoids                           Z86.010, Personal history of colonic polyps                           K57.30, Diverticulosis of large intestine without                            perforation or abscess  without bleeding                           Q43.8, Other specified congenital malformations of                            intestine CPT copyright 2019 [employer]. All rights reserved. The codes documented in this report are preliminary and upon coder review may  be revised to meet current compliance requirements. Barney Drain, MD Barney Drain MD, MD 09/22/2019 2:43:51 PM This report has been signed electronically. Number of Addenda: 0

## 2019-09-22 NOTE — Discharge Instructions (Signed)
You have small internal hemorrhoids and diverticulosis IN YOUR LEFT AND RIGHT COLON. YOU HAD NINE SMALL POLYPs REMOVED.    DRINK WATER TO KEEP YOUR URINE LIGHT YELLOW.  CONTINUE YOUR WEIGHT LOSS EFFORTS. YOUR BODY MASS INDEX IS OVER 30 WHICH MEANS YOU ARE OBESE. OBESITY IS ASSOCIATED WITH AN INCREASE FOR ALL CANCERS, INCLUDING ESOPHAGEAL AND COLON CANCER.  FOLLOW A HIGH FIBER DIET. AVOID ITEMS THAT CAUSE BLOATING. See info below.   USE PREPARATION H FOUR TIMES  A DAY IF NEEDED TO RELIEVE RECTAL PAIN/PRESSURE/BLEEDING.\   YOUR BIOPSY RESULTS WILL BE BACK IN 5 BUSINESS DAYS.  We do not routinely screen for polyps after the age of 84.  Colonoscopy Care After Read the instructions outlined below and refer to this sheet in the next week. These discharge instructions provide you with general information on caring for yourself after you leave the hospital. While your treatment has been planned according to the most current medical practices available, unavoidable complications occasionally occur. If you have any problems or questions after discharge, call DR. Ryson Bacha, [phone].  ACTIVITY  You may resume your regular activity, but move at a slower pace for the next 24 hours.   Take frequent rest periods for the next 24 hours.   Walking will help get rid of the air and reduce the bloated feeling in your belly (abdomen).   No driving for 24 hours (because of the medicine (anesthesia) used during the test).   You may shower.   Do not sign any important legal documents or operate any machinery for 24 hours (because of the anesthesia used during the test).    NUTRITION  Drink plenty of fluids.   You may resume your normal diet as instructed by your doctor.   Begin with a light meal and progress to your normal diet. Heavy or fried foods are harder to digest and may make you feel sick to your stomach (nauseated).   Avoid alcoholic beverages for 24 hours or as instructed.     MEDICATIONS  You may resume your normal medications.   WHAT YOU CAN EXPECT TODAY  Some feelings of bloating in the abdomen.   Passage of more gas than usual.   Spotting of blood in your stool or on the toilet paper  .  IF YOU HAD POLYPS REMOVED DURING THE COLONOSCOPY:  Eat a soft diet IF YOU HAVE NAUSEA, BLOATING, ABDOMINAL PAIN, OR VOMITING.    FINDING OUT THE RESULTS OF YOUR TEST Not all test results are available during your visit. DR. Oneida Alar WILL CALL YOU WITHIN 14 DAYS OF YOUR PROCEDUE WITH YOUR RESULTS. Do not assume everything is normal if you have not heard from DR. Jing Howatt, CALL HER OFFICE AT [phone].  SEEK IMMEDIATE MEDICAL ATTENTION AND CALL THE OFFICE: [phone] IF:  You have more than a spotting of blood in your stool.   Your belly is swollen (abdominal distention).   You are nauseated or vomiting.   You have a temperature over 101F.   You have abdominal pain or discomfort that is severe or gets worse throughout the day.  High-Fiber Diet A high-fiber diet changes your normal diet to include more whole grains, legumes, fruits, and vegetables. Changes in the diet involve replacing refined carbohydrates with unrefined foods. The calorie level of the diet is essentially unchanged. The Dietary Reference Intake (recommended amount) for adult males is 38 grams per day. For adult females, it is 25 grams per day. Pregnant and lactating women should consume 28 grams  of fiber per day. Fiber is the intact part of a plant that is not broken down during digestion. Functional fiber is fiber that has been isolated from the plant to provide a beneficial effect in the body.  PURPOSE  Increase stool bulk.   Ease and regulate bowel movements.   Lower cholesterol.   REDUCE RISK OF COLON CANCER  INDICATIONS THAT YOU NEED MORE FIBER  Constipation and hemorrhoids.   Uncomplicated diverticulosis (intestine condition) and irritable bowel syndrome.   Weight  management.   As a protective measure against hardening of the arteries (atherosclerosis), diabetes, and cancer.   GUIDELINES FOR INCREASING FIBER IN THE DIET  Start adding fiber to the diet slowly. A gradual increase of about 5 more grams (2 servings of most fruits or vegetables) per day is best. Too rapid an increase in fiber may result in constipation, flatulence, and bloating.   Drink enough water and fluids to keep your urine clear or pale yellow. Water, juice, or caffeine-free drinks are recommended. Not drinking enough fluid may cause constipation.   Eat a variety of high-fiber foods rather than one type of fiber.   Try to increase your intake of fiber through using high-fiber foods rather than fiber pills or supplements that contain small amounts of fiber.   The goal is to change the types of food eaten. Do not supplement your present diet with high-fiber foods, but replace foods in your present diet.    Polyps, Colon  A polyp is extra tissue that grows inside your body. Colon polyps grow in the large intestine. The large intestine, also called the colon, is part of your digestive system. It is a long, hollow tube at the end of your digestive tract where your body makes and stores stool. Most polyps are not dangerous. They are benign. This means they are not cancerous. But over time, some types of polyps can turn into cancer. Polyps that are smaller than a pea are usually not harmful. But larger polyps could someday become or may already be cancerous. To be safe, doctors remove all polyps and test them.   PREVENTION There is not one sure way to prevent polyps. You might be able to lower your risk of getting them if you:  Eat more fruits and vegetables and less fatty food.   Do not smoke.   Avoid alcohol.   Exercise every day.   Lose weight if you are overweight.   Eating more calcium and folate can also lower your risk of getting polyps. Some foods that are rich in calcium  are milk, cheese, and broccoli. Some foods that are rich in folate are chickpeas, kidney beans, and spinach.    Diverticulosis Diverticulosis is a common condition that develops when small pouches (diverticula) form in the wall of the colon. The risk of diverticulosis increases with age. It happens more often in people who eat a low-fiber diet. Most individuals with diverticulosis have no symptoms. Those individuals with symptoms usually experience belly (abdominal) pain, constipation, or loose stools (diarrhea).  HOME CARE INSTRUCTIONS  Increase the amount of fiber in your diet as directed by your caregiver or dietician. This may reduce symptoms of diverticulosis.   Drink at least 6 to 8 glasses of water each day to prevent constipation.   Try not to strain when you have a bowel movement.   Avoiding nuts and seeds to prevent complications is NOT NECESSARY.   FOODS HAVING HIGH FIBER CONTENT INCLUDE:  Fruits. Apple, peach, pear, tangerine,  raisins, prunes.   Vegetables. Brussels sprouts, asparagus, broccoli, cabbage, carrot, cauliflower, romaine lettuce, spinach, summer squash, tomato, winter squash, zucchini.   Starchy Vegetables. Baked beans, kidney beans, lima beans, split peas, lentils, potatoes (with skin).    SEEK IMMEDIATE MEDICAL CARE IF:  You develop increasing pain or severe bloating.   You have an oral temperature above 101F.   You develop vomiting or bowel movements that are bloody or black.

## 2019-09-22 NOTE — Anesthesia Preprocedure Evaluation (Signed)
Anesthesia Evaluation  Patient identified by MRN, date of birth, ID band Patient awake    Reviewed: Allergy & Precautions, NPO status , Patient's Chart, lab work & pertinent test results  Airway Mallampati: II  TM Distance: >3 FB Neck ROM: Full    Dental  (+) Dental Advisory Given   Pulmonary shortness of breath and with exertion, sleep apnea (noncomplaint with CPAP) and Continuous Positive Airway Pressure Ventilation , former smoker,    Pulmonary exam normal breath sounds clear to auscultation       Cardiovascular METS: 3 - Mets hypertension, Pt. on medications Normal cardiovascular exam Rhythm:Regular Rate:Normal     Neuro/Psych  Headaches, PSYCHIATRIC DISORDERS Anxiety Depression TIA Neuromuscular disease (LEG CRAMPS)    GI/Hepatic GERD  Medicated and Controlled,  Endo/Other  diabetes, Well Controlled, Type 2, Oral Hypoglycemic Agents  Renal/GU Renal InsufficiencyRenal disease     Musculoskeletal  (+) Arthritis , Osteoarthritis,    Abdominal   Peds  Hematology  (+) anemia ,   Anesthesia Other Findings   Reproductive/Obstetrics negative OB ROS                             Anesthesia Physical Anesthesia Plan  ASA: III  Anesthesia Plan: General   Post-op Pain Management:    Induction: Intravenous  PONV Risk Score and Plan: 2  Airway Management Planned: Nasal Cannula, Natural Airway and Simple Face Mask  Additional Equipment:   Intra-op Plan:   Post-operative Plan:   Informed Consent: I have reviewed the patients History and Physical, chart, labs and discussed the procedure including the risks, benefits and alternatives for the proposed anesthesia with the patient or authorized representative who has indicated his/her understanding and acceptance.     Dental advisory given  Plan Discussed with: CRNA and Surgeon  Anesthesia Plan Comments:         Anesthesia Quick  Evaluation

## 2019-09-22 NOTE — Transfer of Care (Signed)
Immediate Anesthesia Transfer of Care Note  Patient: Cheryl Abbott  Procedure(s) Performed: COLONOSCOPY WITH PROPOFOL (N/A ) POLYPECTOMY  Patient Location: PACU  Anesthesia Type:General  Level of Consciousness: awake  Airway & Oxygen Therapy: Patient Spontanous Breathing  Post-op Assessment: Report given to RN and Post -op Vital signs reviewed and stable  Post vital signs: Reviewed and stable  Last Vitals:  Vitals Value Taken Time  BP    Temp    Pulse    Resp    SpO2      Last Pain:  Vitals:   09/22/19 1321  TempSrc:   PainSc: 0-No pain      Patients Stated Pain Goal: 2 (51/88/41 6606)  Complications: No apparent anesthesia complications

## 2019-09-23 ENCOUNTER — Telehealth: Payer: Self-pay | Admitting: Family Medicine

## 2019-09-23 DIAGNOSIS — E1121 Type 2 diabetes mellitus with diabetic nephropathy: Secondary | ICD-10-CM

## 2019-09-23 DIAGNOSIS — E785 Hyperlipidemia, unspecified: Secondary | ICD-10-CM

## 2019-09-23 NOTE — Telephone Encounter (Signed)
Pls order asap fasting lipid, cmp and EGFr, HBA1C, tsh  and microalb , and let her know, none since 12/2018, thanks

## 2019-09-23 NOTE — Addendum Note (Signed)
Addended by: Eual Fines on: 09/23/2019 11:02 AM   Modules accepted: Orders

## 2019-09-24 LAB — SURGICAL PATHOLOGY

## 2019-09-28 ENCOUNTER — Inpatient Hospital Stay (HOSPITAL_COMMUNITY): Payer: Medicare Other | Attending: Hematology

## 2019-09-28 DIAGNOSIS — C9000 Multiple myeloma not having achieved remission: Secondary | ICD-10-CM | POA: Diagnosis not present

## 2019-09-28 DIAGNOSIS — D472 Monoclonal gammopathy: Secondary | ICD-10-CM

## 2019-09-28 LAB — COMPREHENSIVE METABOLIC PANEL
ALT: 24 U/L (ref 0–44)
AST: 23 U/L (ref 15–41)
Albumin: 3.9 g/dL (ref 3.5–5.0)
Alkaline Phosphatase: 69 U/L (ref 38–126)
Anion gap: 8 (ref 5–15)
BUN: 17 mg/dL (ref 8–23)
CO2: 25 mmol/L (ref 22–32)
Calcium: 9.5 mg/dL (ref 8.9–10.3)
Chloride: 102 mmol/L (ref 98–111)
Creatinine, Ser: 1.73 mg/dL — ABNORMAL HIGH (ref 0.44–1.00)
GFR calc Af Amer: 33 mL/min — ABNORMAL LOW (ref >60)
GFR calc non Af Amer: 28 mL/min — ABNORMAL LOW (ref >60)
Glucose, Bld: 150 mg/dL — ABNORMAL HIGH (ref 70–99)
Potassium: 3.6 mmol/L (ref 3.5–5.1)
Sodium: 135 mmol/L (ref 135–145)
Total Bilirubin: 0.3 mg/dL (ref 0.3–1.2)
Total Protein: 8.3 g/dL — ABNORMAL HIGH (ref 6.5–8.1)

## 2019-09-28 LAB — LACTATE DEHYDROGENASE: LDH: 108 U/L (ref 98–192)

## 2019-09-28 LAB — CBC WITH DIFFERENTIAL/PLATELET
Abs Immature Granulocytes: 0.04 10*3/uL (ref 0.00–0.07)
Basophils Absolute: 0 10*3/uL (ref 0.0–0.1)
Basophils Relative: 1 %
Eosinophils Absolute: 0.2 10*3/uL (ref 0.0–0.5)
Eosinophils Relative: 2 %
HCT: 36.1 % (ref 36.0–46.0)
Hemoglobin: 11.2 g/dL — ABNORMAL LOW (ref 12.0–15.0)
Immature Granulocytes: 1 %
Lymphocytes Relative: 21 %
Lymphs Abs: 1.8 10*3/uL (ref 0.7–4.0)
MCH: 30 pg (ref 26.0–34.0)
MCHC: 31 g/dL (ref 30.0–36.0)
MCV: 96.8 fL (ref 80.0–100.0)
Monocytes Absolute: 0.7 10*3/uL (ref 0.1–1.0)
Monocytes Relative: 8 %
Neutro Abs: 5.9 10*3/uL (ref 1.7–7.7)
Neutrophils Relative %: 67 %
Platelets: 336 10*3/uL (ref 150–400)
RBC: 3.73 MIL/uL — ABNORMAL LOW (ref 3.87–5.11)
RDW: 15.3 % (ref 11.5–15.5)
WBC: 8.7 10*3/uL (ref 4.0–10.5)
nRBC: 0 % (ref 0.0–0.2)

## 2019-09-29 ENCOUNTER — Telehealth: Payer: Self-pay | Admitting: Gastroenterology

## 2019-09-29 LAB — PROTEIN ELECTROPHORESIS, SERUM
A/G Ratio: 0.8 (ref 0.7–1.7)
Albumin ELP: 3.5 g/dL (ref 2.9–4.4)
Alpha-1-Globulin: 0.2 g/dL (ref 0.0–0.4)
Alpha-2-Globulin: 1.1 g/dL — ABNORMAL HIGH (ref 0.4–1.0)
Beta Globulin: 1.3 g/dL (ref 0.7–1.3)
Gamma Globulin: 1.9 g/dL — ABNORMAL HIGH (ref 0.4–1.8)
Globulin, Total: 4.5 g/dL — ABNORMAL HIGH (ref 2.2–3.9)
M-Spike, %: 1.1 g/dL — ABNORMAL HIGH
Total Protein ELP: 8 g/dL (ref 6.0–8.5)

## 2019-09-29 LAB — KAPPA/LAMBDA LIGHT CHAINS
Kappa free light chain: 51.4 mg/L — ABNORMAL HIGH (ref 3.3–19.4)
Kappa, lambda light chain ratio: 3.04 — ABNORMAL HIGH (ref 0.26–1.65)
Lambda free light chains: 16.9 mg/L (ref 5.7–26.3)

## 2019-09-29 NOTE — Telephone Encounter (Signed)
Please call pt. ONE SERRATED POLYP,  SIX SIMPLE ADENOMAS AND TWO HYPERPLASTIC POLYPS REMOVED.   DRINK WATER TO KEEP YOUR URINE LIGHT YELLOW. FOLLOW A HIGH FIBER DIET. AVOID ITEMS THAT CAUSE BLOATING & GAS.  We do not routinely screen for polyps after the age of 20.

## 2019-09-30 NOTE — Telephone Encounter (Signed)
Called unable to leave vm. Mailbox is full

## 2019-10-01 NOTE — Telephone Encounter (Signed)
Called unable to leave vm mailbox is full

## 2019-10-02 ENCOUNTER — Encounter: Payer: Self-pay | Admitting: Emergency Medicine

## 2019-10-02 NOTE — Telephone Encounter (Signed)
Letter sent.

## 2019-10-02 NOTE — Telephone Encounter (Signed)
Called vm is full unable to leave message

## 2019-10-05 ENCOUNTER — Other Ambulatory Visit: Payer: Self-pay

## 2019-10-05 ENCOUNTER — Inpatient Hospital Stay (HOSPITAL_BASED_OUTPATIENT_CLINIC_OR_DEPARTMENT_OTHER): Payer: Medicare Other | Admitting: Hematology

## 2019-10-05 DIAGNOSIS — C9000 Multiple myeloma not having achieved remission: Secondary | ICD-10-CM | POA: Diagnosis not present

## 2019-10-05 DIAGNOSIS — D472 Monoclonal gammopathy: Secondary | ICD-10-CM

## 2019-10-05 NOTE — Progress Notes (Signed)
Virtual Visit via Telephone Note  I connected with Cheryl Abbott on 10/05/19 at 12:00 PM EDT by telephone and verified that I am speaking with the correct person using two identifiers.   I discussed the limitations, risks, security and privacy concerns of performing an evaluation and management service by telephone and the availability of in person appointments. I also discussed with the patient that there may be a patient responsible charge related to this service. The patient expressed understanding and agreed to proceed.   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  She had a bone marrow biopsy done on 02/19/2017 showed trilineage hematopoiesis, plasma cells 14%, cytogenetics normal, FISH panel normal.  Skeletal survey on 11/21/2018 did not show any evidence of lytic lesions.   Observations/Objective: She denies any bleeding per rectum or melena.  She had a colonoscopy done on 09/22/2019.  She reported pain in the lower abdomen which gets worse on eating.  Assessment and Plan:  1.  IgG kappa smoldering myeloma: -We reviewed labs from 09/28/2019.  Creatinine is 1.73 and stable.  Calcium is normal at 9.5.  M spike is 1.1 g, down from 1.3 g.  Free kappa light chains are 51.4 and ratio is 3.04.  Hemoglobin is 11.2. -Her hemoglobin has steadily decreased over the last few times.  Hence have recommended ferritin, iron panel, H08 and folic acid in 4 months. -We will also plan to repeat a skeletal survey prior to next visit in 4 months along with myeloma labs.  2.  Abdominal pain: -She had a colonoscopy on 09/22/2019 which showed polyps in the transverse colon, ascending colon and in the sigmoid colon.  Diverticulosis in the rectosigmoid colon, sigmoid colon and descending colon.  External and internal hemorrhoids seen. -She will call Dr. Oneida Alar office for follow-up of the abdominal pain.   Follow Up Instructions: RTC 4 months with labs and x-ray.    I discussed the assessment and treatment plan with the patient. The patient was provided an opportunity to ask questions and all were answered. The patient agreed with the plan and demonstrated an understanding of the instructions.   The patient was advised to call back or seek an in-person evaluation if the symptoms worsen or if the condition fails to improve as anticipated.  I provided 12 minutes of non-face-to-face time during this encounter.   Derek Jack, MD

## 2019-10-07 ENCOUNTER — Other Ambulatory Visit: Payer: Self-pay | Admitting: Family Medicine

## 2019-10-08 ENCOUNTER — Telehealth: Payer: Self-pay | Admitting: Gastroenterology

## 2019-10-08 NOTE — Telephone Encounter (Signed)
Pt came to front window with a letter she received that we were unable to reach her by phone. I told her that LL was at lunch and would have her call her. I verified phone number and patient said to take out her cell phone number and call her house number and if she isn't there to [facility]. [phone]

## 2019-10-08 NOTE — Telephone Encounter (Signed)
Called lmom

## 2019-10-09 ENCOUNTER — Telehealth: Payer: Self-pay | Admitting: Emergency Medicine

## 2019-10-09 NOTE — Telephone Encounter (Signed)
Called verified name and dob Pt notified of results  Stated she understood and thanked me for the call

## 2019-10-09 NOTE — Telephone Encounter (Signed)
Error, see previous note

## 2019-10-29 ENCOUNTER — Telehealth: Payer: Self-pay

## 2019-10-29 ENCOUNTER — Other Ambulatory Visit: Payer: Self-pay | Admitting: Family Medicine

## 2019-10-29 NOTE — Telephone Encounter (Signed)
Pt came into the office, wants a referral to Urology Dr Beatrix Fetters, he has performed 2 surgeries on her --she feels pressure in lower abdominal and leaks at night, and can go all day without voiding

## 2019-10-30 ENCOUNTER — Telehealth: Payer: Self-pay | Admitting: Urology

## 2019-10-30 ENCOUNTER — Other Ambulatory Visit: Payer: Self-pay | Admitting: Family Medicine

## 2019-10-30 DIAGNOSIS — R32 Unspecified urinary incontinence: Secondary | ICD-10-CM

## 2019-10-30 DIAGNOSIS — R1030 Lower abdominal pain, unspecified: Secondary | ICD-10-CM

## 2019-10-30 NOTE — Telephone Encounter (Signed)
Referral is entered, pls let her know

## 2019-10-30 NOTE — Telephone Encounter (Signed)
Pt called. Has appt for to see you in July. The note attached is last OV note you saw this pt. Pt asking for CT prior to July appt - pt believes she has another stone.     EST - 07/26/2015    Cheryl Abbott          MRN: [id number]  PROVIDER:  Franchot Gallo M.D.  DOB: 16-Jul-1945     SSN:            History of Present Illness Cheryl Abbott came in last week, and had her stent removed. She presented to the emergency room a couple of days later with lower abdominal discomfort, frequency and urgency. A Foley catheter was placed. She does not make much urine came out with that. She continues with the Foley catheter. She also has lower abdominal discomfort left greater than right. No fevers, no chills. She is still on oxycodone. She is having some nausea. She is not on any medicine for nausea.  Past Medical History 1. History of Anxiety (F41.9) 2. 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 History of cardiac disorder (Z86.79) 4. History of depression (Z86.59) 5. History of diabetes mellitus (Z86.39) 6. History of esophageal reflux (Z87.19) 7. History of hypercholesterolemia (Z86.39) 8. History of hypertension (Z86.79) 9. History of sleep apnea (Z86.69) 10. History of Nephrolithiasis Of Both Kidneys 11. History of Ureteral calculus, left (N20.1) 12. Urinary tract infection, acute (N39.0) Surgical History 1. History of Coronary Angiography Without Concomitant Left Heart Catheterization 2. History of Cystoscopy With Insertion Of Ureteral Stent 3. History of Cystoscopy With Insertion Of Ureteral Stent Left 4. History of Cystoscopy With Ureteroscopy 5. History of Cystoscopy With Ureteroscopy With Lithotripsy 6. History of Hysterectomy 7. History of Kidney Surgery 8. History of Percutaneous Lithotomy 9. History of Renal Lithotripsy Current Meds 1. Acetaminophen 500 MG Oral Tablet; Therapy: (Recorded:20Jul2012) to Recorded 2. ALPRAZolam 0.5 MG Oral Tablet; Therapy:  (Recorded:20Jul2012) to Recorded 3. AmLODIPine Besylate 5 MG Oral Tablet; Therapy: (Recorded:20Jul2012) to Recorded 4. Ciprofloxacin HCl TABS; Therapy: (Recorded:31Jan2017) to Recorded 5. Gabapentin 100 MG Oral Capsule; Therapy: (Recorded:26Apr2016) to Recorded 6. Ibuprofen 800 MG Oral Tablet; Therapy: (Recorded:01Oct2013) to Recorded 7. Ketorolac Tromethamine 60 MG/2ML Injection Solution; INJECT 60 MG Intramuscular; To Be Done: [id number]; Status: HOLD FOR - Administration Ordered 8. Lovastatin 40 MG Oral Tablet; Therapy: (Recorded:20Jul2012) to Recorded 9. MetFORMIN HCl - 500 MG Oral Tablet; Therapy: (Recorded:20Jul2012) to Recorded 10. Oxybutynin Chloride 5 MG Oral Tablet; TAKE 1 TABLET qHS as needed for urinary frequency; Therapy: [id number] to (Last Rx:12May2016) Requested for: [id number] Ordered 11. OxyCODONE HCl TABS; Therapy: (Recorded:07Feb2017) to Recorded 12. PARoxetine HCl - 40 MG Oral Tablet; Therapy: (Recorded:20Jul2012) to Recorded 13. Potassium Citrate ER 10 MEQ (1080 MG) Oral Tablet Extended Release; TAKE 1 TABLET 3 TIMES DAILY WITH MEALS; Therapy: 15Apr2014 to (Evaluate:23Apr2016) Requested for: [id number]; Last Rx:19May2015 Ordered 14. Stool Softener TABS; Therapy: (Recorded:26Apr2016) to Recorded 15. TraMADol HCl - 50 MG Oral Tablet; TAKE 1 TABLET Every 6 hours PRN; Therapy: [id number] to (Last Rx:24May2016) Ordered 16. Valium TABS; Therapy: (Recorded:07Feb2017) to Recorded Allergies 1. Penicillins Family History 1. Family history of Alzheimer Disease 2. Family history of Cancer : Maternal Aunt 3. Family history of Congestive Heart Failure : Mother Social History 1. Denied: History of Alcohol Use (History) 2. Caffeine Use 3. Marital History - Divorced 4. Never A Smoker 5. Retired From Work Review of Systems Genitourinary: urinary frequency and urinary urgency.  Gastrointestinal: nausea and abdominal pain.  Constitutional: no fever.  Musculoskeletal:  back  pain.  Vitals Vital Signs [Data Includes: Last 1 Day]  Recorded: [id number] 10:17AM  Blood Pressure: 127 / 81 Temperature: 98.5 F Heart Rate: 105 Physical Exam Constitutional: Well nourished and well developed . No acute distress.  ENT:. The ears and nose are normal in appearance.  Neck: The appearance of the neck is normal.  Skin: Normal skin turgor, no visible rash and no visible skin lesions.  Neuro/Psych:. Mood and affect are appropriate.  Results/Data She was sent for CT stone protocol. This revealed a 5 mm right distal ureteral stone with an upper pole stone in the right kidney. There is moderate to severe hydronephrosis on the right. There is a similar/stable left distal ureteral stone without obstruction.  Assessment 1. Abdominal pain of multiple sites (R10.9) 2. Ureteral calculus, right (N20.1) 3. Ureteral calculus, left (N20.1) Right renal calculi, large, status post right ureteroscopic stone management, now with an obstructing right distal ureteral stone with hydronephrosis. She also has a left distal ureteral stone.  Plan Nephrolithiasis  1. Start: Ondansetron 4 MG Oral Tablet Dispersible; TAKE 1 TABLET 3 times daily PRN nausea 2. AU CT-STONE PROTOCOL; Status:Hold For - Date of Service; Requested for:14Feb2017;  Discussion/Summary I think it is worthwhile to get her scheduled for repeat ureteroscopy, with management of the left distal and the right distal ureteral stones. Also, there is one small fragment of the right kidney. We'll see about getting that scheduled in the near future. I refilled her tramadol, I gave her ondansetron, and I will keep her on suppressive antibiotics. For now, for comfort sake, she will keep the catheter in.  Signatures

## 2019-10-30 NOTE — Telephone Encounter (Signed)
Pt called to schedule an appt. I set her up for July. She states Dr Diona Fanti has performed surgery on her bladder before. She feels like that is the same issue she is having now. Asked if a CT could be ordered before her July appt?

## 2019-10-30 NOTE — Progress Notes (Signed)
amb urology  

## 2019-11-02 NOTE — Telephone Encounter (Signed)
NA / NVM 

## 2019-11-03 ENCOUNTER — Other Ambulatory Visit: Payer: Self-pay | Admitting: Urology

## 2019-11-03 DIAGNOSIS — R1010 Upper abdominal pain, unspecified: Secondary | ICD-10-CM

## 2019-11-03 NOTE — Telephone Encounter (Signed)
I put order in.

## 2019-11-04 ENCOUNTER — Telehealth: Payer: Self-pay | Admitting: Gastroenterology

## 2019-11-04 NOTE — Telephone Encounter (Signed)
Called pt. No answer. And mail box full

## 2019-11-04 NOTE — Telephone Encounter (Signed)
I do not have any paperwork for this pt and I have not been given any paperwork to fill out for her.

## 2019-11-04 NOTE — Telephone Encounter (Signed)
Patient came in and said that [facility] was supposed to fill out papers for her insurance about a month ago and she never heard back.  There are no papers in the drawer to be picked up

## 2019-11-04 NOTE — Telephone Encounter (Signed)
Routing to [place] to see if she was completing any paperwork.

## 2019-11-05 NOTE — Telephone Encounter (Signed)
Called tried to leave vm but mailbox is full and I was not able to. Will try again later

## 2019-11-05 NOTE — Telephone Encounter (Signed)
noted 

## 2019-11-05 NOTE — Telephone Encounter (Signed)
Called pt at [phone], verified pt name and dob  pt stated the paperwork was from [employer] and was sent  in March Notified patient that I did not see a copy of that paperwork in her chart.  Pt stated that she will bring a copy by today to be completed.

## 2019-11-05 NOTE — Telephone Encounter (Signed)
Please contact patient and see what kind of paperwork she needs completed.

## 2019-11-07 ENCOUNTER — Other Ambulatory Visit: Payer: Self-pay | Admitting: Cardiology

## 2019-11-10 NOTE — Telephone Encounter (Signed)
Called pt again x2. No way to leave message. Mailbox full

## 2019-11-16 ENCOUNTER — Encounter: Payer: Self-pay | Admitting: Family Medicine

## 2019-11-16 ENCOUNTER — Other Ambulatory Visit: Payer: Self-pay | Admitting: *Deleted

## 2019-11-16 ENCOUNTER — Other Ambulatory Visit: Payer: Self-pay

## 2019-11-16 ENCOUNTER — Ambulatory Visit (INDEPENDENT_AMBULATORY_CARE_PROVIDER_SITE_OTHER): Payer: Medicare Other | Admitting: Family Medicine

## 2019-11-16 VITALS — BP 135/83 | HR 95 | Temp 98.4°F | Resp 16 | Ht 66.5 in | Wt 193.1 lb

## 2019-11-16 DIAGNOSIS — C9000 Multiple myeloma not having achieved remission: Secondary | ICD-10-CM

## 2019-11-16 DIAGNOSIS — F411 Generalized anxiety disorder: Secondary | ICD-10-CM

## 2019-11-16 DIAGNOSIS — R102 Pelvic and perineal pain: Secondary | ICD-10-CM

## 2019-11-16 DIAGNOSIS — N183 Chronic kidney disease, stage 3 unspecified: Secondary | ICD-10-CM | POA: Diagnosis not present

## 2019-11-16 DIAGNOSIS — E669 Obesity, unspecified: Secondary | ICD-10-CM | POA: Diagnosis not present

## 2019-11-16 DIAGNOSIS — I10 Essential (primary) hypertension: Secondary | ICD-10-CM

## 2019-11-16 DIAGNOSIS — E1121 Type 2 diabetes mellitus with diabetic nephropathy: Secondary | ICD-10-CM

## 2019-11-16 DIAGNOSIS — D472 Monoclonal gammopathy: Secondary | ICD-10-CM

## 2019-11-16 DIAGNOSIS — R350 Frequency of micturition: Secondary | ICD-10-CM

## 2019-11-16 DIAGNOSIS — F324 Major depressive disorder, single episode, in partial remission: Secondary | ICD-10-CM

## 2019-11-16 DIAGNOSIS — E785 Hyperlipidemia, unspecified: Secondary | ICD-10-CM

## 2019-11-16 MED ORDER — AMLODIPINE BESYLATE 10 MG PO TABS: 10.0000 mg | ORAL_TABLET | Freq: Every day | ORAL | 1 refills | Status: DC

## 2019-11-16 MED ORDER — LINAGLIPTIN 5 MG PO TABS: 5.0000 mg | ORAL_TABLET | Freq: Every day | ORAL | 1 refills | Status: DC

## 2019-11-16 MED ORDER — PAROXETINE HCL 40 MG PO TABS: 40.0000 mg | ORAL_TABLET | Freq: Every morning | ORAL | 1 refills | Status: DC

## 2019-11-16 MED ORDER — LOVASTATIN 40 MG PO TABS: 40.0000 mg | ORAL_TABLET | Freq: Every day | ORAL | 1 refills | Status: DC

## 2019-11-16 NOTE — Patient Instructions (Signed)
F/u in office in 4 months, call if you need me sooner  We will call to see if we can establish appt date for scan ordered by Dr Blair Dolphin 2 weeks ago and contact you about this, also will request a sooner than July appt with him if he is available per your request  Paperwork that you report leaving will be tracked by office staff  Fasting lipid, cmp m and EgFR , hBa1C, microalb and urine c/s as soon as possible  You are referred to Dr Marylene Buerger to f/i chronic kidney disease  Pls provide covid immunization to nurse so it can be entered  Thanks for choosing [facility], we consider it a privelige to serve you. Think about what you will eat, plan ahead. Choose " clean, green, fresh or frozen" over canned, processed or packaged foods which are more sugary, salty and fatty. 70 to 75% of food eaten should be vegetables and fruit. Three meals at set times with snacks allowed between meals, but they must be fruit or vegetables. Aim to eat over a 12 hour period , example 7 am to 7 pm, and STOP after  your last meal of the day. Drink water,generally about 64 ounces per day, no other drink is as healthy. Fruit juice is best enjoyed in a healthy way, by EATING the fruit.

## 2019-11-16 NOTE — Progress Notes (Signed)
Cheryl Abbott     MRN: [id number]      DOB: 05-31-1946   HPI Cheryl Abbott is here for follow up and re-evaluation of chronic medical conditions, medication management and review of any available recent lab and radiology data.  Preventive health is updated, specifically  Cancer screening and Immunization.   . The PT denies any adverse reactions to current medications since the last visit.  Pelvic pressure andfeels as though bladder has fallen wants Urology eval, will refer  ROS Denies recent fever or chills. Denies sinus pressure, nasal congestion, ear pain or sore throat. Denies chest congestion, productive cough or wheezing. Denies chest pains, palpitations and leg swelling Denies abdominal pain, nausea, vomiting,diarrhea or constipation.   C/o frequency and vaginal pressure with painful intercourse anxious to get sooner urology appt and also date for scan ordered . C/o chronic  joint pain, swelling and limitation in mobility. Denies headaches, seizures, numbness, or tingling. Denies uncontrolled depression, anxiety or insomnia. Denies skin break down or rash.   PE  BP 135/83 (BP Location: Left Arm, Patient Position: Sitting, Cuff Size: Normal)   Pulse 95   Temp 98.4 F (36.9 C) (Temporal)   Resp 16   Ht 5' 6.5" (1.689 m)   Wt 193 lb 1.9 oz (87.6 kg)   SpO2 94%   BMI 30.70 kg/m   Patient alert and oriented and in no cardiopulmonary distress.  HEENT: No facial asymmetry, EOMI,     Neck supple .  Chest: Clear to auscultation bilaterally.  CVS: S1, S2 no murmurs, no S3.Regular rate.  ABD: not distended.   Ext: No edema  MS: Adequate though reduced  ROM spine, shoulders, hips and knees.  Skin: Intact, no ulcerations or rash noted.  Psych: Good eye contact, normal affect. Memory intact not anxious or depressed appearing.  CNS: CN 2-12 intact, power,  normal throughout.no focal deficits noted.   Assessment & Plan  Hypertension goal BP (blood pressure) <  130/80 Sub optimal control, no med change DASH diet and commitment to daily physical activity for a minimum of 30 minutes discussed and encouraged, as a part of hypertension management. The importance of attaining a healthy weight is also discussed.  BP/Weight 11/16/2019 09/22/2019 09/21/2019 08/20/2019 08/14/2019 7/98/9211 02/13/1739  Systolic BP 814 481 - 856 314 970 263  Diastolic BP 83 61 - 79 80 76 70  Wt. (Lbs) 193.12 - 195 195 195.04 200.2 198  BMI 30.7 - 31.47 31 31.48 31.83 31.96       Type 2 diabetes with nephropathy ([place]) Cheryl Abbott is reminded of the importance of commitment to daily physical activity for 30 minutes or more, as able and the need to limit carbohydrate intake to 30 to 60 grams per meal to help with blood sugar control.   The need to take medication as prescribed, test blood sugar as directed, and to call between visits if there is a concern that blood sugar is uncontrolled is also discussed.  Updated lab needed at/ before next visit.   Cheryl Abbott is reminded of the importance of daily foot exam, annual eye examination, and good blood sugar, blood pressure and cholesterol control.  Diabetic Labs Latest Ref Rng & Units 09/28/2019 08/14/2019 03/27/2019 02/06/2019 01/09/2019  HbA1c 4.8 - 5.6 % - - - - -  Microalbumin mg/dL - - - - -  Micro/Creat Ratio 0.0 - 30.0 mg/g - - - - -  Chol 0 - 200 mg/dL - - - - -  HDL >40 mg/dL - - - - -  Calc LDL 0 - 99 mg/dL - - - - -  Triglycerides <150 mg/dL - - - - -  Creatinine 0.44 - 1.00 mg/dL 1.73(H) 1.86(H) 1.81(H) 1.53(H) 1.49(H)   BP/Weight 11/16/2019 09/22/2019 09/21/2019 08/20/2019 08/14/2019 1/61/0960 09/13/4096  Systolic BP 119 147 - 829 562 130 865  Diastolic BP 83 61 - 79 80 76 70  Wt. (Lbs) 193.12 - 195 195 195.04 200.2 198  BMI 30.7 - 31.47 31 31.48 31.83 31.96   Foot/eye exam completion dates Latest Ref Rng & Units 12/24/2018 05/19/2018  Eye Exam No Retinopathy - -  Foot exam Order - - -  Foot Form Completion - Done Done         Obesity (BMI 30.0-34.9)  Patient re-educated about  the importance of commitment to a  minimum of 150 minutes of exercise per week as able.  The importance of healthy food choices with portion control discussed, as well as eating regularly and within a 12 hour window most days. The need to choose "clean , green" food 50 to 75% of the time is discussed, as well as to make water the primary drink and set a goal of 64 ounces water daily.    Weight /BMI 11/16/2019 09/21/2019 08/20/2019  WEIGHT 193 lb 1.9 oz 195 lb 195 lb  HEIGHT 5' 6.5" 5\' 6"  5' 6.5"  BMI 30.7 kg/m2 31.47 kg/m2 31 kg/m2      CKD (chronic kidney disease) stage 3, GFR 30-59 ml/min (HCC) F/u with nephrology, visit is past due, will refer. Improved blood pressure control and avoidance of nephrotoxic meds is discussed  Depression, major, single episode, in partial remission (HCC) Controlled on current medication, continue same  Generalized anxiety disorder Controlled, no change in medication   Pelvic pressure in female Reports intravaginal pressure and discomfort with dyspareunia, awaiting urology eval and scan, only pelvic organ is bladder, will try to help with appt info per pt request

## 2019-11-17 ENCOUNTER — Encounter: Payer: Self-pay | Admitting: Family Medicine

## 2019-11-17 DIAGNOSIS — R102 Pelvic and perineal pain: Secondary | ICD-10-CM | POA: Insufficient documentation

## 2019-11-17 NOTE — Assessment & Plan Note (Signed)
Being managed by Oncology, has  upcoming appt in 2 monhts and is reminded of this

## 2019-11-17 NOTE — Assessment & Plan Note (Signed)
Sub optimal control, no med change DASH diet and commitment to daily physical activity for a minimum of 30 minutes discussed and encouraged, as a part of hypertension management. The importance of attaining a healthy weight is also discussed.  BP/Weight 11/16/2019 09/22/2019 09/21/2019 08/20/2019 08/14/2019 4/37/0052 10/18/1026  Systolic BP 902 284 - 069 861 483 073  Diastolic BP 83 61 - 79 80 76 70  Wt. (Lbs) 193.12 - 195 195 195.04 200.2 198  BMI 30.7 - 31.47 31 31.48 31.83 31.96

## 2019-11-17 NOTE — Assessment & Plan Note (Signed)
Controlled, no change in medication  

## 2019-11-17 NOTE — Assessment & Plan Note (Signed)
Reports intravaginal pressure and discomfort with dyspareunia, awaiting urology eval and scan, only pelvic organ is bladder, will try to help with appt info per pt request

## 2019-11-17 NOTE — Telephone Encounter (Signed)
Attempted to call pt again. 

## 2019-11-17 NOTE — Assessment & Plan Note (Signed)
Controlled on current medication, continue same 

## 2019-11-17 NOTE — Assessment & Plan Note (Signed)
F/u with nephrology, visit is past due, will refer. Improved blood pressure control and avoidance of nephrotoxic meds is discussed

## 2019-11-17 NOTE — Assessment & Plan Note (Signed)
  Patient re-educated about  the importance of commitment to a  minimum of 150 minutes of exercise per week as able.  The importance of healthy food choices with portion control discussed, as well as eating regularly and within a 12 hour window most days. The need to choose "clean , green" food 50 to 75% of the time is discussed, as well as to make water the primary drink and set a goal of 64 ounces water daily.    Weight /BMI 11/16/2019 09/21/2019 08/20/2019  WEIGHT 193 lb 1.9 oz 195 lb 195 lb  HEIGHT 5' 6.5" 5\' 6"  5' 6.5"  BMI 30.7 kg/m2 31.47 kg/m2 31 kg/m2

## 2019-11-17 NOTE — Assessment & Plan Note (Signed)
Cheryl Abbott is reminded of the importance of commitment to daily physical activity for 30 minutes or more, as able and the need to limit carbohydrate intake to 30 to 60 grams per meal to help with blood sugar control.   The need to take medication as prescribed, test blood sugar as directed, and to call between visits if there is a concern that blood sugar is uncontrolled is also discussed.  Updated lab needed at/ before next visit.   Cheryl Abbott is reminded of the importance of daily foot exam, annual eye examination, and good blood sugar, blood pressure and cholesterol control.  Diabetic Labs Latest Ref Rng & Units 09/28/2019 08/14/2019 03/27/2019 02/06/2019 01/09/2019  HbA1c 4.8 - 5.6 % - - - - -  Microalbumin mg/dL - - - - -  Micro/Creat Ratio 0.0 - 30.0 mg/g - - - - -  Chol 0 - 200 mg/dL - - - - -  HDL >40 mg/dL - - - - -  Calc LDL 0 - 99 mg/dL - - - - -  Triglycerides <150 mg/dL - - - - -  Creatinine 0.44 - 1.00 mg/dL 1.73(H) 1.86(H) 1.81(H) 1.53(H) 1.49(H)   BP/Weight 11/16/2019 09/22/2019 09/21/2019 08/20/2019 08/14/2019 9/45/8592 02/10/4461  Systolic BP 863 817 - 711 657 903 833  Diastolic BP 83 61 - 79 80 76 70  Wt. (Lbs) 193.12 - 195 195 195.04 200.2 198  BMI 30.7 - 31.47 31 31.48 31.83 31.96   Foot/eye exam completion dates Latest Ref Rng & Units 12/24/2018 05/19/2018  Eye Exam No Retinopathy - -  Foot exam Order - - -  Foot Form Completion - Done Done

## 2019-11-18 ENCOUNTER — Other Ambulatory Visit: Payer: Self-pay | Admitting: Family Medicine

## 2019-11-18 DIAGNOSIS — R350 Frequency of micturition: Secondary | ICD-10-CM | POA: Diagnosis not present

## 2019-11-18 DIAGNOSIS — E785 Hyperlipidemia, unspecified: Secondary | ICD-10-CM | POA: Diagnosis not present

## 2019-11-18 DIAGNOSIS — E1121 Type 2 diabetes mellitus with diabetic nephropathy: Secondary | ICD-10-CM | POA: Diagnosis not present

## 2019-11-18 DIAGNOSIS — I10 Essential (primary) hypertension: Secondary | ICD-10-CM | POA: Diagnosis not present

## 2019-11-18 MED ORDER — ROSUVASTATIN CALCIUM 20 MG PO TABS: 20.0000 mg | ORAL_TABLET | Freq: Every day | ORAL | 3 refills | Status: DC

## 2019-11-19 ENCOUNTER — Telehealth: Payer: Self-pay

## 2019-11-19 LAB — COMPLETE METABOLIC PANEL WITH GFR
AG Ratio: 1.2 (calc) (ref 1.0–2.5)
ALT: 14 U/L (ref 6–29)
AST: 15 U/L (ref 10–35)
Albumin: 4.4 g/dL (ref 3.6–5.1)
Alkaline phosphatase (APISO): 80 U/L (ref 37–153)
BUN/Creatinine Ratio: 8 (calc) (ref 6–22)
BUN: 12 mg/dL (ref 7–25)
CO2: 27 mmol/L (ref 20–32)
Calcium: 9.8 mg/dL (ref 8.6–10.4)
Chloride: 103 mmol/L (ref 98–110)
Creat: 1.53 mg/dL — ABNORMAL HIGH (ref 0.60–0.93)
GFR, Est African American: 38 mL/min/{1.73_m2} — ABNORMAL LOW (ref >=60)
GFR, Est Non African American: 33 mL/min/{1.73_m2} — ABNORMAL LOW (ref >=60)
Globulin: 3.8 g/dL (calc) — ABNORMAL HIGH (ref 1.9–3.7)
Glucose, Bld: 126 mg/dL — ABNORMAL HIGH (ref 65–99)
Potassium: 4 mmol/L (ref 3.5–5.3)
Sodium: 137 mmol/L (ref 135–146)
Total Bilirubin: 0.2 mg/dL (ref 0.2–1.2)
Total Protein: 8.2 g/dL — ABNORMAL HIGH (ref 6.1–8.1)

## 2019-11-19 LAB — HEMOGLOBIN A1C
Hgb A1c MFr Bld: 6.4 % of total Hgb — ABNORMAL HIGH (ref <5.7)
Mean Plasma Glucose: 137 (calc)
eAG (mmol/L): 7.6 (calc)

## 2019-11-19 LAB — LIPID PANEL
Cholesterol: 190 mg/dL (ref <200)
HDL: 43 mg/dL — ABNORMAL LOW (ref >=50)
LDL Cholesterol (Calc): 122 mg/dL (calc) — ABNORMAL HIGH
Non-HDL Cholesterol (Calc): 147 mg/dL (calc) — ABNORMAL HIGH (ref <130)
Total CHOL/HDL Ratio: 4.4 (calc) (ref <5.0)
Triglycerides: 130 mg/dL (ref <150)

## 2019-11-19 NOTE — Telephone Encounter (Signed)
Pt LVM that she is returning your call  °

## 2019-11-19 NOTE — Telephone Encounter (Signed)
Patient aware of lab results.

## 2019-11-20 DIAGNOSIS — I10 Essential (primary) hypertension: Secondary | ICD-10-CM | POA: Diagnosis not present

## 2019-11-20 DIAGNOSIS — E1121 Type 2 diabetes mellitus with diabetic nephropathy: Secondary | ICD-10-CM | POA: Diagnosis not present

## 2019-11-20 DIAGNOSIS — E785 Hyperlipidemia, unspecified: Secondary | ICD-10-CM | POA: Diagnosis not present

## 2019-11-20 DIAGNOSIS — R350 Frequency of micturition: Secondary | ICD-10-CM | POA: Diagnosis not present

## 2019-11-21 LAB — URINE CULTURE
MICRO NUMBER:: 10582097
SPECIMEN QUALITY:: ADEQUATE

## 2019-11-21 LAB — MICROALBUMIN, URINE: Microalb, Ur: 4.7 mg/dL

## 2019-11-30 ENCOUNTER — Other Ambulatory Visit: Payer: Self-pay | Admitting: Cardiology

## 2019-12-07 ENCOUNTER — Other Ambulatory Visit: Payer: Self-pay

## 2019-12-07 ENCOUNTER — Encounter: Payer: Self-pay | Admitting: Family Medicine

## 2019-12-07 ENCOUNTER — Ambulatory Visit (INDEPENDENT_AMBULATORY_CARE_PROVIDER_SITE_OTHER): Payer: Medicare Other | Admitting: Family Medicine

## 2019-12-07 VITALS — BP 110/72 | HR 100 | Temp 96.8°F | Resp 16 | Ht 66.5 in | Wt 193.0 lb

## 2019-12-07 DIAGNOSIS — E1121 Type 2 diabetes mellitus with diabetic nephropathy: Secondary | ICD-10-CM

## 2019-12-07 DIAGNOSIS — F419 Anxiety disorder, unspecified: Secondary | ICD-10-CM | POA: Diagnosis not present

## 2019-12-07 DIAGNOSIS — E785 Hyperlipidemia, unspecified: Secondary | ICD-10-CM

## 2019-12-07 DIAGNOSIS — I10 Essential (primary) hypertension: Secondary | ICD-10-CM

## 2019-12-07 DIAGNOSIS — F32A Depression, unspecified: Secondary | ICD-10-CM

## 2019-12-07 DIAGNOSIS — F329 Major depressive disorder, single episode, unspecified: Secondary | ICD-10-CM

## 2019-12-07 DIAGNOSIS — M4722 Other spondylosis with radiculopathy, cervical region: Secondary | ICD-10-CM

## 2019-12-07 DIAGNOSIS — E669 Obesity, unspecified: Secondary | ICD-10-CM

## 2019-12-07 NOTE — Patient Instructions (Signed)
Follow-up with MD as before call if you need me sooner.  Please get fasting lipid CMP and EGFR 1 week before your October appointment.  No changes in your current medication.  Eye exam appointment to be given at checkout ( on site)  It is important that you exercise regularly at least 30 minutes 5 times a week. If you develop chest pain, have severe difficulty breathing, or feel very tired, stop exercising immediately and seek medical attention  Think about what you will eat, plan ahead. Choose " clean, green, fresh or frozen" over canned, processed or packaged foods which are more sugary, salty and fatty. 70 to 75% of food eaten should be vegetables and fruit. Three meals at set times with snacks allowed between meals, but they must be fruit or vegetables. Aim to eat over a 12 hour period , example 7 am to 7 pm, and STOP after  your last meal of the day. Drink water,generally about 64 ounces per day, no other drink is as healthy. Fruit juice is best enjoyed in a healthy way, by EATING the fruit. Thanks for choosing [facility], we consider it a privelige to serve you.

## 2019-12-08 ENCOUNTER — Encounter: Payer: Self-pay | Admitting: Family Medicine

## 2019-12-08 NOTE — Assessment & Plan Note (Signed)
Hyperlipidemia:Low fat diet discussed and encouraged.   Lipid Panel  Lab Results  Component Value Date   CHOL 190 11/18/2019   HDL 43 (L) 11/18/2019   LDLCALC 122 (H) 11/18/2019   TRIG 130 11/18/2019   CHOLHDL 4.4 11/18/2019   Needs to reduce fried and fatty foods

## 2019-12-08 NOTE — Assessment & Plan Note (Signed)
Controlled, no change in medication DASH diet and commitment to daily physical activity for a minimum of 30 minutes discussed and encouraged, as a part of hypertension management. The importance of attaining a healthy weight is also discussed.  BP/Weight 12/07/2019 11/16/2019 09/22/2019 09/21/2019 08/20/2019 08/14/2019 07/07/5168  Systolic BP 017 494 496 - 759 163 846  Diastolic BP 72 83 61 - 79 80 76  Wt. (Lbs) 193.04 193.12 - 195 195 195.04 200.2  BMI 30.69 30.7 - 31.47 31 31.48 31.83

## 2019-12-08 NOTE — Assessment & Plan Note (Signed)
  Patient re-educated about  the importance of commitment to a  minimum of 150 minutes of exercise per week as able.  The importance of healthy food choices with portion control discussed, as well as eating regularly and within a 12 hour window most days. The need to choose "clean , green" food 50 to 75% of the time is discussed, as well as to make water the primary drink and set a goal of 64 ounces water daily.    Weight /BMI 12/07/2019 11/16/2019 09/21/2019  WEIGHT 193 lb 0.6 oz 193 lb 1.9 oz 195 lb  HEIGHT 5' 6.5" 5' 6.5" 5\' 6"   BMI 30.69 kg/m2 30.7 kg/m2 31.47 kg/m2

## 2019-12-08 NOTE — Progress Notes (Signed)
Cheryl Abbott     MRN: [id number]      DOB: Sep 14, 1945   HPI Cheryl Abbott is here for follow up and re-evaluation of chronic medical conditions, medication management and review of any available recent lab and radiology data.  Preventive health is updated, specifically  Cancer screening and Immunization.   Questions or concerns regarding consultations or procedures which the PT has had in the interim are  addressed. The PT denies any adverse reactions to current medications since the last visit.  C/oleft flank and abdominalpain , has upcoming scan and Urology eval , has ahd recurrent kidney stone and she is anxiously awaiting this Denies polyuria, polydipsia, blurred vision , or hypoglycemic episodes.  ROS Denies recent fever or chills. Denies sinus pressure, nasal congestion, ear pain or sore throat. Denies chest congestion, productive cough or wheezing. Denies chest pains, palpitations and leg swelling . Denies uncontrolled joint pain, swelling and limitation in mobility. Denies headaches, seizures, numbness, or tingling. Denies uncontrolled  depression, anxiety or insomnia. Denies skin break down or rash.   PE  BP 110/72   Pulse 100   Temp (!) 96.8 F (36 C)   Resp 16   Ht 5' 6.5" (1.689 m)   Wt 193 lb 0.6 oz (87.6 kg)   SpO2 96%   BMI 30.69 kg/m   Patient alert and oriented and in no cardiopulmonary distress.  HEENT: No facial asymmetry, EOMI,     Neck supple .  Chest: Clear to auscultation bilaterally.  CVS: S1, S2 no murmurs, no S3.Regular rate.    Ext: No edema  MS: decreased  ROM spine, shoulders, hips and knees.  Skin: Intact, no ulcerations or rash noted.  Psych: Good eye contact, normal affect. Memory intact not anxious or depressed appearing.  CNS: CN 2-12 intact, power,  normal throughout.no focal deficits noted.   Assessment & Plan  Hypertension goal BP (blood pressure) < 130/80 Controlled, no change in medication DASH diet and commitment  to daily physical activity for a minimum of 30 minutes discussed and encouraged, as a part of hypertension management. The importance of attaining a healthy weight is also discussed.  BP/Weight 12/07/2019 11/16/2019 09/22/2019 09/21/2019 08/20/2019 08/14/2019 4/97/0263  Systolic BP 785 885 027 - 741 287 867  Diastolic BP 72 83 61 - 79 80 76  Wt. (Lbs) 193.04 193.12 - 195 195 195.04 200.2  BMI 30.69 30.7 - 31.47 31 31.48 31.83       Hyperlipidemia LDL goal <100 Hyperlipidemia:Low fat diet discussed and encouraged.   Lipid Panel  Lab Results  Component Value Date   CHOL 190 11/18/2019   HDL 43 (L) 11/18/2019   LDLCALC 122 (H) 11/18/2019   TRIG 130 11/18/2019   CHOLHDL 4.4 11/18/2019   Needs to reduce fried and fatty foods    Type 2 diabetes with nephropathy ([place]) Cheryl Abbott is reminded of the importance of commitment to daily physical activity for 30 minutes or more, as able and the need to limit carbohydrate intake to 30 to 60 grams per meal to help with blood sugar control.   The need to take medication as prescribed, test blood sugar as directed, and to call between visits if there is a concern that blood sugar is uncontrolled is also discussed.   Cheryl Abbott is reminded of the importance of daily foot exam, annual eye examination, and good blood sugar, blood pressure and cholesterol control. Controlled, no change in medication   Diabetic Labs Latest Ref Rng &  Units 11/20/2019 11/18/2019 09/28/2019 08/14/2019 03/27/2019  HbA1c <5.7 % of total Hgb - 6.4(H) - - -  Microalbumin mg/dL 4.7 - - - -  Micro/Creat Ratio 0.0 - 30.0 mg/g - - - - -  Chol <200 mg/dL - 190 - - -  HDL > OR = 50 mg/dL - 43(L) - - -  Calc LDL mg/dL (calc) - 122(H) - - -  Triglycerides <150 mg/dL - 130 - - -  Creatinine 0.60 - 0.93 mg/dL - 1.53(H) 1.73(H) 1.86(H) 1.81(H)   BP/Weight 12/07/2019 11/16/2019 09/22/2019 09/21/2019 08/20/2019 08/14/2019 3/00/9233  Systolic BP 007 622 633 - 354 562 563  Diastolic BP 72 83 61 -  79 80 76  Wt. (Lbs) 193.04 193.12 - 195 195 195.04 200.2  BMI 30.69 30.7 - 31.47 31 31.48 31.83   Foot/eye exam completion dates Latest Ref Rng & Units 12/24/2018 05/19/2018  Eye Exam No Retinopathy - -  Foot exam Order - - -  Foot Form Completion - Done Done        Anxiety and depression Controlled, no change in medication   Obesity (BMI 30.0-34.9)  Patient re-educated about  the importance of commitment to a  minimum of 150 minutes of exercise per week as able.  The importance of healthy food choices with portion control discussed, as well as eating regularly and within a 12 hour window most days. The need to choose "clean , green" food 50 to 75% of the time is discussed, as well as to make water the primary drink and set a goal of 64 ounces water daily.    Weight /BMI 12/07/2019 11/16/2019 09/21/2019  WEIGHT 193 lb 0.6 oz 193 lb 1.9 oz 195 lb  HEIGHT 5' 6.5" 5' 6.5" 5\' 6"   BMI 30.69 kg/m2 30.7 kg/m2 31.47 kg/m2      Cervical spondylosis with radiculopathy Chronic pain,no change in mdication

## 2019-12-08 NOTE — Assessment & Plan Note (Signed)
Controlled, no change in medication  

## 2019-12-08 NOTE — Assessment & Plan Note (Signed)
Chronic pain,no change in mdication

## 2019-12-08 NOTE — Assessment & Plan Note (Signed)
Cheryl Abbott is reminded of the importance of commitment to daily physical activity for 30 minutes or more, as able and the need to limit carbohydrate intake to 30 to 60 grams per meal to help with blood sugar control.   The need to take medication as prescribed, test blood sugar as directed, and to call between visits if there is a concern that blood sugar is uncontrolled is also discussed.   Cheryl Abbott is reminded of the importance of daily foot exam, annual eye examination, and good blood sugar, blood pressure and cholesterol control. Controlled, no change in medication   Diabetic Labs Latest Ref Rng & Units 11/20/2019 11/18/2019 09/28/2019 08/14/2019 03/27/2019  HbA1c <5.7 % of total Hgb - 6.4(H) - - -  Microalbumin mg/dL 4.7 - - - -  Micro/Creat Ratio 0.0 - 30.0 mg/g - - - - -  Chol <200 mg/dL - 190 - - -  HDL > OR = 50 mg/dL - 43(L) - - -  Calc LDL mg/dL (calc) - 122(H) - - -  Triglycerides <150 mg/dL - 130 - - -  Creatinine 0.60 - 0.93 mg/dL - 1.53(H) 1.73(H) 1.86(H) 1.81(H)   BP/Weight 12/07/2019 11/16/2019 09/22/2019 09/21/2019 08/20/2019 08/14/2019 3/56/7014  Systolic BP 103 013 143 - 888 757 972  Diastolic BP 72 83 61 - 79 80 76  Wt. (Lbs) 193.04 193.12 - 195 195 195.04 200.2  BMI 30.69 30.7 - 31.47 31 31.48 31.83   Foot/eye exam completion dates Latest Ref Rng & Units 12/24/2018 05/19/2018  Eye Exam No Retinopathy - -  Foot exam Order - - -  Foot Form Completion - Done Done

## 2019-12-17 ENCOUNTER — Other Ambulatory Visit: Payer: Self-pay

## 2019-12-17 ENCOUNTER — Ambulatory Visit (HOSPITAL_COMMUNITY)
Admission: RE | Admit: 2019-12-17 | Discharge: 2019-12-17 | Disposition: A | Discharge status: Home / Self Care | Payer: Medicare Other | Source: Ambulatory Visit | Attending: Urology | Admitting: Urology

## 2019-12-17 DIAGNOSIS — I700 Atherosclerosis of aorta: Secondary | ICD-10-CM | POA: Diagnosis not present

## 2019-12-17 DIAGNOSIS — N132 Hydronephrosis with renal and ureteral calculous obstruction: Secondary | ICD-10-CM | POA: Diagnosis not present

## 2019-12-17 DIAGNOSIS — K5730 Diverticulosis of large intestine without perforation or abscess without bleeding: Secondary | ICD-10-CM | POA: Diagnosis not present

## 2019-12-17 DIAGNOSIS — R1010 Upper abdominal pain, unspecified: Secondary | ICD-10-CM

## 2019-12-22 ENCOUNTER — Ambulatory Visit (INDEPENDENT_AMBULATORY_CARE_PROVIDER_SITE_OTHER): Payer: Medicare Other | Admitting: Urology

## 2019-12-22 ENCOUNTER — Encounter: Payer: Self-pay | Admitting: Urology

## 2019-12-22 ENCOUNTER — Ambulatory Visit (HOSPITAL_COMMUNITY)
Admission: RE | Admit: 2019-12-22 | Discharge: 2019-12-22 | Disposition: A | Discharge status: Home / Self Care | Payer: Medicare Other | Source: Ambulatory Visit | Attending: Urology | Admitting: Urology

## 2019-12-22 ENCOUNTER — Other Ambulatory Visit: Payer: Self-pay

## 2019-12-22 VITALS — BP 102/68 | HR 108 | Temp 98.7°F | Ht 66.0 in | Wt 191.0 lb

## 2019-12-22 DIAGNOSIS — N201 Calculus of ureter: Secondary | ICD-10-CM

## 2019-12-22 DIAGNOSIS — N200 Calculus of kidney: Secondary | ICD-10-CM | POA: Diagnosis not present

## 2019-12-22 DIAGNOSIS — N2889 Other specified disorders of kidney and ureter: Secondary | ICD-10-CM | POA: Diagnosis not present

## 2019-12-22 NOTE — Progress Notes (Signed)
H&P  Chief Complaint: Kidney stone  History of Present Illness: Cheryl Abbott is a 74 y.o. year old female last seen in 2017 after treatment for ureteral stricture/hydronephrosis on the right.  She has had right greater than left abdominal pain over the past month.  No gross hematuria but she does have nausea with this.  No recent treatment for urinary tract infections.  CT scan was done on July 8 revealing a 10 x 6 mm left upper ureteral calculus.  She is here today for further management.  Past Medical History:  Diagnosis Date  . Abnormal brain scan 02/07/2012  . Anemia   . Anxiety   . Anxiety disorder   . Arthritis   . Brain TIA 08/19/2015  . Cancer ([place])    acute myeloma  . 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 Chronic back pain   . Depression   . Fatty liver   . GERD (gastroesophageal reflux disease)   . Headache   . HEMORRHOIDS 03/15/2009   OCT 2014 HB 12. 9    . History of adenomatous polyp of colon    2008  . History of kidney stones   . Hypercalcemia   . Hypercalcemia 11/30/2010  . Hyperlipidemia   . Hypertension   . Lactose intolerance in adult 02/01/2016  . Left ureteral stone   . Lipoma of arm 01/19/2015  . Lumbar disc disease with radiculopathy   . Nephrolithiasis   . Neuropathic pain of both feet 04/11/2016  . Neuropathy, peripheral   . Nocturnal hypoxia 02/04/2014   Study confirms this done 02/10/2014   . OSA treated with BiPAP    per study 2007  . RENAL CALCULUS 03/15/2009   Qualifier: Diagnosis of  By: Zeb Comfort    . Sciatica   . Shoulder pain, bilateral   . Sigmoid diverticulosis   . TIA (transient ischemic attack) 02/26/2016  . Type 2 diabetes mellitus ([place])   . Vaginal dryness, menopausal 10/14/2016    Past Surgical History:  Procedure Laterality Date  . BIOPSY N/A 03/17/2013   Procedure: GASTRIC BIOPSIES;  Surgeon: Danie Binder, MD;  Location: [facility];  Service: Endoscopy;  Laterality: N/A;  . BIOPSY  06/10/2015   Procedure: BIOPSY;   Surgeon: Danie Binder, MD;  [facility];  Service: Endoscopy;;  gastric biopsy  . CARDIAC CATHETERIZATION  05-11-2003  dr Shelva Majestic ([facility])   Abnormal cardiolite/   Normal coronary arteries and normal LVF,  ef  63%  . CARDIOVASCULAR STRESS TEST  01-01-2014   normal lexiscan cardiolite/  no ischemia/ infarct/  normal LV function and wall motion ,  ef 81%  . COLONOSCOPY  last one 10/02/2011   MOD [place] TICS, IH, NEXT TCS APR 2018  . COLONOSCOPY WITH PROPOFOL N/A 02/26/2017   Procedure: COLONOSCOPY WITH PROPOFOL;  Surgeon: Danie Binder, MD;  [facility];  Service: Endoscopy;  Laterality: N/A;  11:00am  . COLONOSCOPY WITH PROPOFOL N/A 09/22/2019   Procedure: COLONOSCOPY WITH PROPOFOL;  Surgeon: Danie Binder, MD;  [facility];  Service: Endoscopy;  Laterality: N/A;  1:15  . CYSTO/   RIGHT URETEROSOCOPY LASER LITHOTRIPSY STONE EXTRACTION  10-13-2004  . CYSTO/  RIGHT RETROGRADE PYELOGRAM/  PLACMENT RIGHT URETERAL STENT  01-10-2010  . CYSTOSCOPY W/ URETERAL STENT PLACEMENT Right 08/19/2015   Procedure: CYSTOSCOPY WITH RETROGRADE PYELOGRAM/URETERAL STENT PLACEMENT;  Surgeon: Franchot Gallo, MD;  Location: [facility];  Service: Urology;  Laterality: Right;  . CYSTOSCOPY  WITH RETROGRADE PYELOGRAM, URETEROSCOPY AND STENT PLACEMENT Left 10/21/2014   Procedure: CYSTOSCOPY WITH RETROGRADE PYELOGRAM, URETEROSCOPY AND STENT PLACEMENT;  Surgeon: Franchot Gallo, MD;  Location: [facility];  Service: Urology;  Laterality: Left;  . CYSTOSCOPY WITH RETROGRADE PYELOGRAM, URETEROSCOPY AND STENT PLACEMENT Right 11/22/2015   Procedure: CYSTOSCOPY, RIGHT URETERAL STENT REMOVAL; RIGHT RETROGRADE PYELOGRAM, RIGHT URETEROSCOPY WITH BALLOON DILATION; RIGHT URETERAL STENT PLACEMENT;  Surgeon: Franchot Gallo, MD;  [facility];  Service: Urology;  Laterality: Right;  . CYSTOSCOPY/RETROGRADE/URETEROSCOPY/STONE EXTRACTION WITH BASKET Bilateral  08/02/2015   Procedure: CYSTOSCOPY; BILATERAL RETROGRADE PYELOGRAMS; BILATERAL URETEROSCOPY, STONE EXTRACTION WITH BASKET;  Surgeon: Franchot Gallo, MD;  [facility];  Service: Urology;  Laterality: Bilateral;  . CYSTOSCOPY/RETROGRADE/URETEROSCOPY/STONE EXTRACTION WITH BASKET Right 06/28/2015   Procedure: CYSTOSCOPY/RETROGRADE/URETEROSCOPY/STONE EXTRACTION WITH BASKET, RIGHT URETERAL DOUBLE J STENT PLACEMENT;  Surgeon: Franchot Gallo, MD;  [facility];  Service: Urology;  Laterality: Right;  . ESOPHAGOGASTRODUODENOSCOPY (EGD) WITH PROPOFOL N/A 03/17/2013   Procedure: ESOPHAGOGASTRODUODENOSCOPY (EGD) WITH PROPOFOL;  Surgeon: Danie Binder, MD;  [facility];  Service: Endoscopy;  Laterality: N/A;  . ESOPHAGOGASTRODUODENOSCOPY (EGD) WITH PROPOFOL N/A 06/10/2015   Distal gastritis, distal esophageal stricture s/p dilation  . ESOPHAGOGASTRODUODENOSCOPY (EGD) WITH PROPOFOL N/A 02/26/2017   Procedure: ESOPHAGOGASTRODUODENOSCOPY (EGD) WITH PROPOFOL;  Surgeon: Danie Binder, MD;  [facility];  Service: Endoscopy;  Laterality: N/A;  . FLEXIBLE SIGMOIDOSCOPY  09/11/2011   RDE:[id number] Internal hemorrhoids  . HOLMIUM LASER APPLICATION Left 5/63/1497   Procedure: HOLMIUM LASER APPLICATION;  Surgeon: Franchot Gallo, MD;  Location: [facility];  Service: Urology;  Laterality: Left;  . HOLMIUM LASER APPLICATION Right 0/26/3785   Procedure: HOLMIUM LASER APPLICATION;  Surgeon: Franchot Gallo, MD;  [facility];  Service: Urology;  Laterality: Right;  . MASS EXCISION Left 03/04/2015   Procedure: EXCISION OF SOFT TISSUE NEOPLASM LEFT ARM;  Surgeon: Aviva Signs, MD;  Location: [facility];  Service: General;  Laterality: Left;  . PERCUTANEOUS NEPHROSTOLITHOTOMY Bilateral 12/  2011     [facility]  . POLYPECTOMY N/A 03/17/2013   Procedure: GASTRIC POLYPECTOMY;  Surgeon: Danie Binder, MD;  [facility];  Service: Endoscopy;  Laterality: N/A;  . POLYPECTOMY  02/26/2017    Procedure: POLYPECTOMY;  Surgeon: Danie Binder, MD;  [facility];  Service: Endoscopy;;  polyp at cecum, ascending colon polyps x3, hepatic flexure polyps x8, transverse colon polyps x8   . POLYPECTOMY  09/22/2019   Procedure: POLYPECTOMY;  Surgeon: Danie Binder, MD;  [facility];  Service: Endoscopy;;  . REMOVAL RIGHT THIGH CYST  2006  . SAVORY DILATION N/A 03/17/2013   Procedure: SAVORY DILATION;  Surgeon: Danie Binder, MD;  [facility];  Service: Endoscopy;  Laterality: N/A;  #12.8, 14, 15, 16 dilators used  . SAVORY DILATION N/A 06/10/2015   Procedure: SAVORY DILATION;  Surgeon: Danie Binder, MD;  [facility];  Service: Endoscopy;  Laterality: N/A;  . TRANSTHORACIC ECHOCARDIOGRAM  01-01-2014   mild LVH/  ef 88-50%/  grade I diastolic dysfunction/  trivial MR, TR, and PR  . VAGINAL HYSTERECTOMY  1970's    Home Medications:  (Not in a hospital admission)   Allergies:  Allergies  Allergen Reactions  . Ace Inhibitors Cough  . Keflex [Cephalexin] Diarrhea and Nausea And Vomiting  . Nitrofurantoin Diarrhea and Nausea And Vomiting  . Penicillins Hives and Other (See Comments)    Reaction:  Blisters on hands/feet  Has patient  had a PCN reaction causing immediate rash, facial/tongue/throat swelling, SOB or lightheadedness with hypotension: No Has patient had a PCN reaction causing severe rash involving mucus membranes or skin necrosis: No Has patient had a PCN reaction that required hospitalization No Has patient had a PCN reaction occurring within the last 10 years: No If all of the above answers are "NO", then may proceed with Cephalosporin use.    Family History  Problem Relation Age of Onset  . Hypertension Mother   . Diabetes Mother   . Heart failure Mother   . Dementia Mother   . Emphysema Father   . Hypertension Father   . Diabetes Brother   . GER disease Brother   . Hypertension Sister   . Hypertension Sister   .  Cancer Other        family history   . Diabetes Other        family history   . Heart defect Other        famiily history   . Arthritis Other        family history   . Anesthesia problems Neg Hx   . Hypotension Neg Hx   . Malignant hyperthermia Neg Hx   . Pseudochol deficiency Neg Hx   . Colon cancer Neg Hx     Social History:  reports that she quit smoking about 31 years ago. Her smoking use included cigarettes. She started smoking about 45 years ago. She has a 20.00 pack-year smoking history. She has never used smokeless tobacco. She reports that she does not drink alcohol and does not use drugs.  ROS: A complete review of systems was performed.  All systems are negative except for pertinent findings as noted.  Physical Exam:  Vital signs in last 24 hours: @VSRANGES @ General:  Alert and oriented, No acute distress HEENT: Normocephalic, atraumatic Neck: No JVD or lymphadenopathy Cardiovascular: Regular rate  Lungs: Normal inspiratory/expiratory excursion Extremities: No edema Neurologic: Grossly intact   Radiologic Imaging: CT images were reviewed independently, patient was able to see her images as well. No results found.  Impression/Assessment:  Left upper ureteral stone, symptomatic, more than likely will not pass due to the size  Plan:  I will have her sent for KUB today.  I could not see the stone on scout images of the CT scan.  If easily seen, proceed with eventual shockwave lithotripsy.  If not easily seen, she will need ureteroscopic management.  Cheryl Abbott 12/22/2019, 10:55 AM  Cheryl Boxer. Olene Godfrey MD

## 2019-12-22 NOTE — Progress Notes (Signed)
Urological Symptom Review  Patient is experiencing the following symptoms: Frequent urination Get up at night to urinate Weak stream  Kidney stones   Review of Systems  Gastrointestinal (upper)  : Nausea Vomiting  Gastrointestinal (lower) : Constipation  Constitutional : Night Sweats  Skin: Negative for skin symptoms  Eyes: Negative for eye symptoms  Ear/Nose/Throat : Negative for Ear/Nose/Throat symptoms  Hematologic/Lymphatic: Negative for Hematologic/Lymphatic symptoms  Cardiovascular : Leg swelling  Respiratory : Shortness of breath  Endocrine: Negative for endocrine symptoms  Musculoskeletal: Negative for musculoskeletal symptoms  Neurological: Negative for neurological symptoms  Psychologic: Negative for psychiatric symptoms

## 2019-12-28 ENCOUNTER — Ambulatory Visit: Payer: Medicare Other

## 2019-12-28 ENCOUNTER — Telehealth: Payer: Self-pay

## 2019-12-28 ENCOUNTER — Other Ambulatory Visit: Payer: Self-pay

## 2019-12-28 LAB — HM DIABETES EYE EXAM

## 2019-12-28 NOTE — Telephone Encounter (Signed)
Pt advised to cancel the paperwork

## 2019-12-31 DIAGNOSIS — C9001 Multiple myeloma in remission: Secondary | ICD-10-CM | POA: Diagnosis not present

## 2019-12-31 DIAGNOSIS — R809 Proteinuria, unspecified: Secondary | ICD-10-CM | POA: Diagnosis not present

## 2019-12-31 DIAGNOSIS — E211 Secondary hyperparathyroidism, not elsewhere classified: Secondary | ICD-10-CM | POA: Diagnosis not present

## 2019-12-31 DIAGNOSIS — N1832 Chronic kidney disease, stage 3b: Secondary | ICD-10-CM | POA: Diagnosis not present

## 2019-12-31 DIAGNOSIS — I129 Hypertensive chronic kidney disease with stage 1 through stage 4 chronic kidney disease, or unspecified chronic kidney disease: Secondary | ICD-10-CM | POA: Diagnosis not present

## 2020-01-07 ENCOUNTER — Ambulatory Visit (INDEPENDENT_AMBULATORY_CARE_PROVIDER_SITE_OTHER): Payer: Medicare Other | Admitting: Family Medicine

## 2020-01-07 ENCOUNTER — Other Ambulatory Visit: Payer: Self-pay

## 2020-01-07 ENCOUNTER — Encounter: Payer: Self-pay | Admitting: Family Medicine

## 2020-01-07 VITALS — BP 125/78 | HR 93 | Resp 16 | Ht 66.0 in | Wt 188.0 lb

## 2020-01-07 DIAGNOSIS — F324 Major depressive disorder, single episode, in partial remission: Secondary | ICD-10-CM

## 2020-01-07 DIAGNOSIS — I10 Essential (primary) hypertension: Secondary | ICD-10-CM | POA: Diagnosis not present

## 2020-01-07 DIAGNOSIS — R109 Unspecified abdominal pain: Secondary | ICD-10-CM

## 2020-01-07 DIAGNOSIS — E1121 Type 2 diabetes mellitus with diabetic nephropathy: Secondary | ICD-10-CM

## 2020-01-07 MED ORDER — ONETOUCH ULTRA VI STRP: ORAL_STRIP | 5 refills | Status: DC

## 2020-01-07 MED ORDER — ONETOUCH DELICA LANCETS 30G MISC: 5 refills | Status: DC

## 2020-01-07 NOTE — Progress Notes (Signed)
Cheryl Abbott     MRN: [id number]      DOB: December 13, 1945   HPI Cheryl Abbott is here for follow up and re-evaluation of chronic medical conditions, medication management and review of any available recent lab and radiology data.  Preventive health is updated, specifically  Cancer screening and Immunization.   Questions or concerns regarding consultations or procedures which the PT has had in the interim are  addressed. The PT denies any adverse reactions to current medications since the last visit.   knows that she has small kidney stones C./o vaginal dryness requested hormone [ pills but has decided against this as and will continue KY jelly ROS Denies recent fever or chills. Denies sinus pressure, nasal congestion, ear pain or sore throat. Denies chest congestion, productive cough or wheezing. Denies chest pains, palpitations and leg swelling Denies , nausea, vomiting,diarrhea or constipation.   Denies dysuria, frequency, hesitancy or incontinence. Denies joint pain, swelling and limitation in mobility. Denies headaches, seizures, numbness, or tingling. Denies uncontrolled depression, anxiety or insomnia. Denies skin break down or rash.   PE  BP 125/78   Pulse 93   Resp 16   Ht 5\' 6"  (1.676 m)   Wt 188 lb (85.3 kg)   SpO2 98%   BMI 30.34 kg/m   Patient alert and oriented and in no cardiopulmonary distress.  HEENT: No facial asymmetry, EOMI,     Neck supple .  Chest: Clear to auscultation bilaterally.  CVS: S1, S2 no murmurs, no S3.Regular rate.  ABD: Soft no flank tenderness Ext: No edema  MS: Adequate ROM spine, shoulders, hips and knees.  Skin: Intact, no ulcerations or rash noted.  Psych: Good eye contact, normal affect. Memory intact not anxious or depressed appearing.  CNS: CN 2-12 intact, power,  normal throughout.no focal deficits noted.   Assessment & Plan  Flank pain Intermittent with known kidney stones. 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   The need to take medication as prescribed, test blood sugar as directed, and to call between visits if there is a concern that blood sugar is uncontrolled is also discussed.   Cheryl Abbott is reminded of the importance of daily foot exam, annual eye examination, and good blood sugar, blood pressure and cholesterol control.  Diabetic Labs Latest Ref Rng & Units 11/20/2019 11/18/2019 09/28/2019 08/14/2019 03/27/2019  HbA1c <5.7 % of total Hgb - 6.4(H) - - -  Microalbumin mg/dL 4.7 - - - -  Micro/Creat Ratio 0.0 - 30.0 mg/g - - - - -  Chol <200 mg/dL - 190 - - -  HDL > OR = 50 mg/dL - 43(L) - - -  Calc LDL mg/dL (calc) - 122(H) - - -  Triglycerides <150 mg/dL - 130 - - -  Creatinine 0.60 - 0.93 mg/dL - 1.53(H) 1.73(H) 1.86(H) 1.81(H)   BP/Weight 01/07/2020 12/22/2019 12/07/2019 11/16/2019 09/22/2019 09/21/2019 4/65/6812  Systolic BP 751 700 174 944 967 - 591  Diastolic BP 78 68 72 83 61 - 79  Wt. (Lbs) 188 191 193.04 193.12 - 195 195  BMI 30.34 30.83 30.69 30.7 -  31.47 31   Foot/eye exam completion dates Latest Ref Rng & Units 01/07/2020 12/28/2019  Eye Exam No Retinopathy - No Retinopathy  Foot exam Order - - -  Foot Form Completion - Done -        Hypertension goal BP (blood pressure) < 130/80 Controlled, no change in medication DASH diet and commitment to daily physical activity for a minimum of 30 minutes discussed and encouraged, as a part of hypertension management. The importance of attaining a healthy weight is also discussed.  BP/Weight 01/07/2020 12/22/2019 12/07/2019 11/16/2019 09/22/2019  09/21/2019 6/80/8811  Systolic BP 031 594 585 929 244 - 628  Diastolic BP 78 68 72 83 61 - 79  Wt. (Lbs) 188 191 193.04 193.12 - 195 195  BMI 30.34 30.83 30.69 30.7 - 31.47 31

## 2020-01-07 NOTE — Patient Instructions (Addendum)
F/U in October as before  Take tylenol for pain and if severe then call Urologist as the pain is due to kidney stones  Continue KY jelly and no hormone tablets  It is important that you exercise regularly at least 30 minutes 5 times a week. If you develop chest pain, have severe difficulty breathing, or feel very tired, stop exercising immediately and seek medical attention   Think about what you will eat, plan ahead. Choose " clean, green, fresh or frozen" over canned, processed or packaged foods which are more sugary, salty and fatty. 70 to 75% of food eaten should be vegetables and fruit. Three meals at set times with snacks allowed between meals, but they must be fruit or vegetables. Aim to eat over a 12 hour period , example 7 am to 7 pm, and STOP after  your last meal of the day. Drink water,generally about 64 ounces per day, no other drink is as healthy. Fruit juice is best enjoyed in a healthy way, by EATING the fruit.  Thanks for choosing [facility], we consider it a privelige to serve you.     Exercises To Do While Sitting  Exercises that you do while sitting (chair exercises) can give you many of the same benefits as full exercise. Benefits include strengthening your heart, burning calories, and keeping muscles and joints healthy. Exercise can also improve your mood and help with depression and anxiety. You may benefit from chair exercises if you are unable to do standing exercises because of:  Diabetic foot pain.  Obesity.  Illness.  Arthritis.  Recovery from surgery or injury.  Breathing problems.  Balance problems.  Another type of disability. Before starting chair exercises, check with your health care provider or a physical therapist to find out how much exercise you can tolerate and which exercises are safe for you. If your health care provider approves:  Start out slowly and build up over time. Aim to work up to about 10-20 minutes for each  exercise session.  Make exercise part of your daily routine.  Drink water when you exercise. Do not wait until you are thirsty. Drink every 10-15 minutes.  Stop exercising right away if you have pain, nausea, shortness of breath, or dizziness.  If you are exercising in a wheelchair, make sure to lock the wheels.  Ask your health care provider whether you can do tai chi or yoga. Many positions in these mind-body exercises can be modified to do while seated. Warm-up Before starting other exercises: 1. Sit up as straight as you can. Have your knees bent at 90 degrees, which is the shape of the capital letter "L." Keep your feet flat on the floor. 2. Sit at the front edge of your chair, if you can. 3. Pull in (tighten) the muscles in your abdomen and stretch your spine and neck as straight as you can. Hold this position for a few minutes. 4. Breathe in and out evenly. Try to concentrate on your breathing, and relax your mind. Stretching Exercise A: Arm stretch 1. Hold your arms out straight in front of your body. 2. Bend your hands at the wrist with your fingers pointing up, as if signaling someone to stop. Notice the slight tension in your forearms as you hold the position. 3. Keeping your arms out and your hands bent, rotate your hands outward as far as you can and hold this stretch. Aim to have your thumbs pointing up and your pinkie fingers pointing down.  Slowly repeat arm stretches for one minute as tolerated. Exercise B: Leg stretch 1. If you can move your legs, try to "draw" letters on the floor with the toes of your foot. Write your name with one foot. 2. Write your name with the toes of your other foot. Slowly repeat the movements for one minute as tolerated. Exercise C: Reach for the sky 1. Reach your hands as far over your head as you can to stretch your spine. 2. Move your hands and arms as if you are climbing a rope. Slowly repeat the movements for one minute as  tolerated. Range of motion exercises Exercise A: Shoulder roll 1. Let your arms hang loosely at your sides. 2. Lift just your shoulders up toward your ears, then let them relax back down. 3. When your shoulders feel loose, rotate your shoulders in backward and forward circles. Do shoulder rolls slowly for one minute as tolerated. Exercise B: March in place 1. As if you are marching, pump your arms and lift your legs up and down. Lift your knees as high as you can. ? If you are unable to lift your knees, just pump your arms and move your ankles and feet up and down. March in place for one minute as tolerated. Exercise C: Seated jumping jacks 1. Let your arms hang down straight. 2. Keeping your arms straight, lift them up over your head. Aim to point your fingers to the ceiling. 3. While you lift your arms, straighten your legs and slide your heels along the floor to your sides, as wide as you can. 4. As you bring your arms back down to your sides, slide your legs back together. ? If you are unable to use your legs, just move your arms. Slowly repeat seated jumping jacks for one minute as tolerated. Strengthening exercises Exercise A: Shoulder squeeze 1. Hold your arms straight out from your body to your sides, with your elbows bent and your fists pointed at the ceiling. 2. Keeping your arms in the bent position, move them forward so your elbows and forearms meet in front of your face. 3. Open your arms back out as wide as you can with your elbows still bent, until you feel your shoulder blades squeezing together. Hold for 5 seconds. Slowly repeat the movements forward and backward for one minute as tolerated. Contact a health care provider if you:  Had to stop exercising due to any of the following: ? Pain. ? Nausea. ? Shortness of breath. ? Dizziness. ? Fatigue.  Have significant pain or soreness after exercising. Get help right away if you have:  Chest pain.  Difficulty  breathing. These symptoms may represent a serious problem that is an emergency. Do not wait to see if the symptoms will go away. Get medical help right away. Call your local emergency services (911 in the [place] Do not drive yourself to the hospital. This information is not intended to replace advice given to you by your health care provider. Make sure you discuss any questions you have with your health care provider. Document Revised: 09/18/2018 Document Reviewed: 04/10/2017 Elsevier Patient Education  2020 [employer].

## 2020-01-11 ENCOUNTER — Encounter: Payer: Self-pay | Admitting: Family Medicine

## 2020-01-11 DIAGNOSIS — R109 Unspecified abdominal pain: Secondary | ICD-10-CM | POA: Insufficient documentation

## 2020-01-11 NOTE — Assessment & Plan Note (Signed)
Controlled, no change in medication Cheryl Abbott is reminded of the importance of commitment to daily physical activity for 30 minutes or more, as able and the need to limit carbohydrate intake to 30 to 60 grams per meal to help with blood sugar control.   The need to take medication as prescribed, test blood sugar as directed, and to call between visits if there is a concern that blood sugar is uncontrolled is also discussed.   Cheryl Abbott is reminded of the importance of daily foot exam, annual eye examination, and good blood sugar, blood pressure and cholesterol control.  Diabetic Labs Latest Ref Rng & Units 11/20/2019 11/18/2019 09/28/2019 08/14/2019 03/27/2019  HbA1c <5.7 % of total Hgb - 6.4(H) - - -  Microalbumin mg/dL 4.7 - - - -  Micro/Creat Ratio 0.0 - 30.0 mg/g - - - - -  Chol <200 mg/dL - 190 - - -  HDL > OR = 50 mg/dL - 43(L) - - -  Calc LDL mg/dL (calc) - 122(H) - - -  Triglycerides <150 mg/dL - 130 - - -  Creatinine 0.60 - 0.93 mg/dL - 1.53(H) 1.73(H) 1.86(H) 1.81(H)   BP/Weight 01/07/2020 12/22/2019 12/07/2019 11/16/2019 09/22/2019 09/21/2019 12/01/6331  Systolic BP 545 625 638 937 342 - 876  Diastolic BP 78 68 72 83 61 - 79  Wt. (Lbs) 188 191 193.04 193.12 - 195 195  BMI 30.34 30.83 30.69 30.7 - 31.47 31   Foot/eye exam completion dates Latest Ref Rng & Units 01/07/2020 12/28/2019  Eye Exam No Retinopathy - No Retinopathy  Foot exam Order - - -  Foot Form Completion - Done -

## 2020-01-11 NOTE — Assessment & Plan Note (Signed)
Controlled, no change in medication DASH diet and commitment to daily physical activity for a minimum of 30 minutes discussed and encouraged, as a part of hypertension management. The importance of attaining a healthy weight is also discussed.  BP/Weight 01/07/2020 12/22/2019 12/07/2019 11/16/2019 09/22/2019 09/21/2019 4/37/0052  Systolic BP 591 028 902 284 069 - 861  Diastolic BP 78 68 72 83 61 - 79  Wt. (Lbs) 188 191 193.04 193.12 - 195 195  BMI 30.34 30.83 30.69 30.7 - 31.47 31

## 2020-01-11 NOTE — Assessment & Plan Note (Signed)
Controlled, no change in medication  

## 2020-01-11 NOTE — Assessment & Plan Note (Signed)
Intermittent with known kidney stones. Advised tylenol, if worsens call Urology

## 2020-01-13 ENCOUNTER — Telehealth: Payer: Self-pay

## 2020-01-13 NOTE — Telephone Encounter (Signed)
-----   Message from Franchot Gallo, MD sent at 01/12/2020  4:53 PM EDT ----- Please notify patient that her stone is well seen on a plain x-ray.  I think she would be a good candidate for shockwave lithotripsy.  If she would like to proceed with that, we can get her scheduled.  Let me know.Other alternative would be to do a ureteroscopy which would be under general anesthetic rather than sedation. ----- Message ----- From: Dorisann Frames, RN Sent: 12/22/2019   4:22 PM EDT To: Franchot Gallo, MD  Please review

## 2020-01-15 ENCOUNTER — Telehealth: Payer: Self-pay | Admitting: Orthopedic Surgery

## 2020-01-15 ENCOUNTER — Other Ambulatory Visit: Payer: Self-pay | Admitting: Orthopedic Surgery

## 2020-01-15 MED ORDER — DICLOFENAC SODIUM 1 % EX GEL
4.0000 g | Freq: Four times a day (QID) | CUTANEOUS | 5 refills | Status: DC
Start: 2020-01-15 — End: 2020-07-15

## 2020-01-15 NOTE — Telephone Encounter (Signed)
Patient requests refill on Voltaren Topical Gel 1%  She uses [facility]

## 2020-01-15 NOTE — Telephone Encounter (Signed)
-----   Message from Franchot Gallo, MD sent at 01/12/2020  4:53 PM EDT ----- Please notify patient that her stone is well seen on a plain x-ray.  I think she would be a good candidate for shockwave lithotripsy.  If she would like to proceed with that, we can get her scheduled.  Let me know.Other alternative would be to do a ureteroscopy which would be under general anesthetic rather than sedation. ----- Message ----- From: Dorisann Frames, RN Sent: 12/22/2019   4:22 PM EDT To: Franchot Gallo, MD  Please review

## 2020-01-29 ENCOUNTER — Telehealth: Payer: Self-pay

## 2020-01-29 NOTE — Telephone Encounter (Signed)
error 

## 2020-02-01 ENCOUNTER — Inpatient Hospital Stay (HOSPITAL_COMMUNITY): Payer: Medicare Other | Attending: Hematology

## 2020-02-01 ENCOUNTER — Other Ambulatory Visit: Payer: Self-pay

## 2020-02-01 ENCOUNTER — Ambulatory Visit (HOSPITAL_COMMUNITY)
Admission: RE | Admit: 2020-02-01 | Discharge: 2020-02-01 | Disposition: A | Discharge status: Home / Self Care | Payer: Medicare Other | Source: Ambulatory Visit | Attending: Hematology | Admitting: Hematology

## 2020-02-01 DIAGNOSIS — M549 Dorsalgia, unspecified: Secondary | ICD-10-CM | POA: Insufficient documentation

## 2020-02-01 DIAGNOSIS — K219 Gastro-esophageal reflux disease without esophagitis: Secondary | ICD-10-CM | POA: Insufficient documentation

## 2020-02-01 DIAGNOSIS — Z79899 Other long term (current) drug therapy: Secondary | ICD-10-CM | POA: Diagnosis not present

## 2020-02-01 DIAGNOSIS — D472 Monoclonal gammopathy: Secondary | ICD-10-CM

## 2020-02-01 DIAGNOSIS — K760 Fatty (change of) liver, not elsewhere classified: Secondary | ICD-10-CM | POA: Diagnosis not present

## 2020-02-01 DIAGNOSIS — R109 Unspecified abdominal pain: Secondary | ICD-10-CM | POA: Insufficient documentation

## 2020-02-01 DIAGNOSIS — R0602 Shortness of breath: Secondary | ICD-10-CM | POA: Diagnosis not present

## 2020-02-01 DIAGNOSIS — C9000 Multiple myeloma not having achieved remission: Secondary | ICD-10-CM | POA: Insufficient documentation

## 2020-02-01 DIAGNOSIS — Z8673 Personal history of transient ischemic attack (TIA), and cerebral infarction without residual deficits: Secondary | ICD-10-CM | POA: Diagnosis not present

## 2020-02-01 DIAGNOSIS — D631 Anemia in chronic kidney disease: Secondary | ICD-10-CM | POA: Insufficient documentation

## 2020-02-01 DIAGNOSIS — G4700 Insomnia, unspecified: Secondary | ICD-10-CM | POA: Diagnosis not present

## 2020-02-01 DIAGNOSIS — Z87891 Personal history of nicotine dependence: Secondary | ICD-10-CM | POA: Insufficient documentation

## 2020-02-01 DIAGNOSIS — E119 Type 2 diabetes mellitus without complications: Secondary | ICD-10-CM | POA: Insufficient documentation

## 2020-02-01 DIAGNOSIS — D509 Iron deficiency anemia, unspecified: Secondary | ICD-10-CM | POA: Insufficient documentation

## 2020-02-01 DIAGNOSIS — F418 Other specified anxiety disorders: Secondary | ICD-10-CM | POA: Insufficient documentation

## 2020-02-01 DIAGNOSIS — Z87442 Personal history of urinary calculi: Secondary | ICD-10-CM | POA: Diagnosis not present

## 2020-02-01 DIAGNOSIS — N189 Chronic kidney disease, unspecified: Secondary | ICD-10-CM | POA: Diagnosis not present

## 2020-02-01 DIAGNOSIS — I129 Hypertensive chronic kidney disease with stage 1 through stage 4 chronic kidney disease, or unspecified chronic kidney disease: Secondary | ICD-10-CM | POA: Diagnosis not present

## 2020-02-01 DIAGNOSIS — M129 Arthropathy, unspecified: Secondary | ICD-10-CM | POA: Insufficient documentation

## 2020-02-01 LAB — COMPREHENSIVE METABOLIC PANEL
ALT: 15 U/L (ref 0–44)
AST: 21 U/L (ref 15–41)
Albumin: 4.2 g/dL (ref 3.5–5.0)
Alkaline Phosphatase: 84 U/L (ref 38–126)
Anion gap: 9 (ref 5–15)
BUN: 13 mg/dL (ref 8–23)
CO2: 23 mmol/L (ref 22–32)
Calcium: 9.8 mg/dL (ref 8.9–10.3)
Chloride: 102 mmol/L (ref 98–111)
Creatinine, Ser: 1.71 mg/dL — ABNORMAL HIGH (ref 0.44–1.00)
GFR calc Af Amer: 33 mL/min — ABNORMAL LOW (ref >60)
GFR calc non Af Amer: 29 mL/min — ABNORMAL LOW (ref >60)
Glucose, Bld: 158 mg/dL — ABNORMAL HIGH (ref 70–99)
Potassium: 3.8 mmol/L (ref 3.5–5.1)
Sodium: 134 mmol/L — ABNORMAL LOW (ref 135–145)
Total Bilirubin: 0.3 mg/dL (ref 0.3–1.2)
Total Protein: 9 g/dL — ABNORMAL HIGH (ref 6.5–8.1)

## 2020-02-01 LAB — CBC WITH DIFFERENTIAL/PLATELET
Abs Immature Granulocytes: 0.04 10*3/uL (ref 0.00–0.07)
Basophils Absolute: 0.1 10*3/uL (ref 0.0–0.1)
Basophils Relative: 1 %
Eosinophils Absolute: 0.6 10*3/uL — ABNORMAL HIGH (ref 0.0–0.5)
Eosinophils Relative: 7 %
HCT: 36.5 % (ref 36.0–46.0)
Hemoglobin: 11 g/dL — ABNORMAL LOW (ref 12.0–15.0)
Immature Granulocytes: 1 %
Lymphocytes Relative: 20 %
Lymphs Abs: 1.6 10*3/uL (ref 0.7–4.0)
MCH: 27.5 pg (ref 26.0–34.0)
MCHC: 30.1 g/dL (ref 30.0–36.0)
MCV: 91.3 fL (ref 80.0–100.0)
Monocytes Absolute: 0.8 10*3/uL (ref 0.1–1.0)
Monocytes Relative: 10 %
Neutro Abs: 5 10*3/uL (ref 1.7–7.7)
Neutrophils Relative %: 61 %
Platelets: 383 10*3/uL (ref 150–400)
RBC: 4 MIL/uL (ref 3.87–5.11)
RDW: 16.9 % — ABNORMAL HIGH (ref 11.5–15.5)
WBC: 8.1 10*3/uL (ref 4.0–10.5)
nRBC: 0 % (ref 0.0–0.2)

## 2020-02-01 LAB — IRON AND TIBC
Iron: 45 ug/dL (ref 28–170)
Saturation Ratios: 9 % — ABNORMAL LOW (ref 10.4–31.8)
TIBC: 514 ug/dL — ABNORMAL HIGH (ref 250–450)
UIBC: 469 ug/dL

## 2020-02-01 LAB — FERRITIN: Ferritin: 9 ng/mL — ABNORMAL LOW (ref 11–307)

## 2020-02-01 LAB — VITAMIN B12: Vitamin B-12: 958 pg/mL — ABNORMAL HIGH (ref 180–914)

## 2020-02-01 LAB — FOLATE: Folate: 9.2 ng/mL (ref >5.9)

## 2020-02-02 LAB — PROTEIN ELECTROPHORESIS, SERUM
A/G Ratio: 0.8 (ref 0.7–1.7)
Albumin ELP: 4 g/dL (ref 2.9–4.4)
Alpha-1-Globulin: 0.2 g/dL (ref 0.0–0.4)
Alpha-2-Globulin: 1.2 g/dL — ABNORMAL HIGH (ref 0.4–1.0)
Beta Globulin: 1.4 g/dL — ABNORMAL HIGH (ref 0.7–1.3)
Gamma Globulin: 2.3 g/dL — ABNORMAL HIGH (ref 0.4–1.8)
Globulin, Total: 5.2 g/dL — ABNORMAL HIGH (ref 2.2–3.9)
M-Spike, %: 1.4 g/dL — ABNORMAL HIGH
Total Protein ELP: 9.2 g/dL — ABNORMAL HIGH (ref 6.0–8.5)

## 2020-02-02 LAB — KAPPA/LAMBDA LIGHT CHAINS
Kappa free light chain: 70.7 mg/L — ABNORMAL HIGH (ref 3.3–19.4)
Kappa, lambda light chain ratio: 3.78 — ABNORMAL HIGH (ref 0.26–1.65)
Lambda free light chains: 18.7 mg/L (ref 5.7–26.3)

## 2020-02-08 ENCOUNTER — Inpatient Hospital Stay (HOSPITAL_BASED_OUTPATIENT_CLINIC_OR_DEPARTMENT_OTHER): Payer: Medicare Other | Admitting: Hematology

## 2020-02-08 ENCOUNTER — Other Ambulatory Visit: Payer: Self-pay

## 2020-02-08 VITALS — BP 124/79 | HR 90 | Temp 97.8°F | Resp 16 | Wt 188.7 lb

## 2020-02-08 DIAGNOSIS — M549 Dorsalgia, unspecified: Secondary | ICD-10-CM | POA: Diagnosis not present

## 2020-02-08 DIAGNOSIS — M129 Arthropathy, unspecified: Secondary | ICD-10-CM | POA: Diagnosis not present

## 2020-02-08 DIAGNOSIS — G4700 Insomnia, unspecified: Secondary | ICD-10-CM | POA: Diagnosis not present

## 2020-02-08 DIAGNOSIS — N189 Chronic kidney disease, unspecified: Secondary | ICD-10-CM | POA: Diagnosis not present

## 2020-02-08 DIAGNOSIS — C9000 Multiple myeloma not having achieved remission: Secondary | ICD-10-CM | POA: Diagnosis not present

## 2020-02-08 DIAGNOSIS — D472 Monoclonal gammopathy: Secondary | ICD-10-CM

## 2020-02-08 DIAGNOSIS — R109 Unspecified abdominal pain: Secondary | ICD-10-CM | POA: Diagnosis not present

## 2020-02-08 DIAGNOSIS — Z87442 Personal history of urinary calculi: Secondary | ICD-10-CM | POA: Diagnosis not present

## 2020-02-08 DIAGNOSIS — K760 Fatty (change of) liver, not elsewhere classified: Secondary | ICD-10-CM | POA: Diagnosis not present

## 2020-02-08 DIAGNOSIS — K219 Gastro-esophageal reflux disease without esophagitis: Secondary | ICD-10-CM | POA: Diagnosis not present

## 2020-02-08 DIAGNOSIS — E119 Type 2 diabetes mellitus without complications: Secondary | ICD-10-CM | POA: Diagnosis not present

## 2020-02-08 DIAGNOSIS — Z87891 Personal history of nicotine dependence: Secondary | ICD-10-CM | POA: Diagnosis not present

## 2020-02-08 DIAGNOSIS — D509 Iron deficiency anemia, unspecified: Secondary | ICD-10-CM | POA: Diagnosis not present

## 2020-02-08 DIAGNOSIS — D631 Anemia in chronic kidney disease: Secondary | ICD-10-CM | POA: Diagnosis not present

## 2020-02-08 DIAGNOSIS — Z79899 Other long term (current) drug therapy: Secondary | ICD-10-CM | POA: Diagnosis not present

## 2020-02-08 DIAGNOSIS — Z8673 Personal history of transient ischemic attack (TIA), and cerebral infarction without residual deficits: Secondary | ICD-10-CM | POA: Diagnosis not present

## 2020-02-08 DIAGNOSIS — R0602 Shortness of breath: Secondary | ICD-10-CM | POA: Diagnosis not present

## 2020-02-08 DIAGNOSIS — I129 Hypertensive chronic kidney disease with stage 1 through stage 4 chronic kidney disease, or unspecified chronic kidney disease: Secondary | ICD-10-CM | POA: Diagnosis not present

## 2020-02-08 NOTE — Patient Instructions (Addendum)
[place] at [facility] Discharge Instructions  You were seen today by Dr. Delton Coombes. He went over your recent results and scans. You will be scheduled for 2 iron infusion 1 week apart. Drink plenty of water daily. Dr. Delton Coombes will see you back in 6 months for labs and follow up.   Thank you for choosing [place] at [facility] to provide your oncology and hematology care.  To afford each patient quality time with our provider, please arrive at least 15 minutes before your scheduled appointment time.   If you have a lab appointment with the [facility] please come in thru the Main Entrance and check in at the main information desk  You need to re-schedule your appointment should you arrive 10 or more minutes late.  We strive to give you quality time with our providers, and arriving late affects you and other patients whose appointments are after yours.  Also, if you no show three or more times for appointments you may be dismissed from the clinic at the providers discretion.     Again, thank you for choosing [facility].  Our hope is that these requests will decrease the amount of time that you wait before being seen by our physicians.       _____________________________________________________________  Should you have questions after your visit to [facility], please contact our office at [phone] between the hours of 8:00 a.m. and 4:30 p.m.  Voicemails left after 4:00 p.m. will not be returned until the following business day.  For prescription refill requests, have your pharmacy contact our office and allow 72 hours.    Cancer Center Support Programs:   > Cancer Support Group  2nd Tuesday of the month 1pm-2pm, Journey Room

## 2020-02-08 NOTE — Progress Notes (Signed)
[name],  [address]   CLINIC:  Medical Oncology/Hematology  PCP:  [name], MD [address], [address] / [place] [address]  [phone]  REASON FOR VISIT:  Follow-up for IgG kappa smoldering myeloma  PRIOR THERAPY: None  CURRENT THERAPY: Observation  INTERVAL HISTORY:  [name], a 74 y.o. female, returns for routine follow-up for her IgG kappa smoldering myeloma. Francie was last contacted via phone on 10/05/2019.  Today she is accompanied by her daughter. She reports feeling okay. She is trying to drink more water daily and reports having multiple nephrolithiases in the past.   REVIEW OF SYSTEMS:  Review of Systems  Constitutional: Positive for appetite change (moderately decreased) and fatigue (severe).  HENT:   Positive for trouble swallowing.   Respiratory: Positive for shortness of breath (w/ exertion).   Gastrointestinal: Positive for abdominal pain (10/10 lower abdominal pain), nausea and vomiting.  Genitourinary: Positive for difficulty urinating.   Psychiatric/Behavioral: Positive for sleep disturbance.  All other systems reviewed and are negative.   PAST MEDICAL/SURGICAL HISTORY:  Past Medical History:  Diagnosis Date  . Abnormal brain scan 02/07/2012  . Anemia   . Anxiety   . Anxiety disorder   . Arthritis   . Brain TIA 08/19/2015  . Cancer ([place])    acute myeloma  . 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 Chronic back pain   . Depression   . Fatty liver   . GERD (gastroesophageal reflux disease)   . Headache   . HEMORRHOIDS 03/15/2009   OCT 2014 HB 12. 9    . History of adenomatous polyp of colon    2008  . History of kidney stones   . Hypercalcemia   . Hypercalcemia 11/30/2010  . Hyperlipidemia   . Hypertension   . Lactose intolerance in adult 02/01/2016  . Left ureteral stone   . Lipoma of arm 01/19/2015  . Lumbar disc disease with radiculopathy   . Nephrolithiasis    . Neuropathic pain of both feet 04/11/2016  . Neuropathy, peripheral   . Nocturnal hypoxia 02/04/2014   Study confirms this done 02/10/2014   . OSA treated with BiPAP    per study 2007  . RENAL CALCULUS 03/15/2009   Qualifier: Diagnosis of  By: Zeb Comfort    . Sciatica   . Shoulder pain, bilateral   . Sigmoid diverticulosis   . TIA (transient ischemic attack) 02/26/2016  . Type 2 diabetes mellitus ([place])   . Vaginal dryness, menopausal 10/14/2016   Past Surgical History:  Procedure Laterality Date  . BIOPSY N/A 03/17/2013   Procedure: GASTRIC BIOPSIES;  Surgeon: Danie Binder, MD;  Location: [facility];  Service: Endoscopy;  Laterality: N/A;  . BIOPSY  06/10/2015   Procedure: BIOPSY;  Surgeon: Danie Binder, MD;  [facility];  Service: Endoscopy;;  gastric biopsy  . CARDIAC CATHETERIZATION  05-11-2003  dr Shelva Majestic ([facility])   Abnormal cardiolite/   Normal coronary arteries and normal LVF,  ef  63%  . CARDIOVASCULAR STRESS TEST  01-01-2014   normal lexiscan cardiolite/  no ischemia/ infarct/  normal LV function and wall motion ,  ef 81%  . COLONOSCOPY  last one 10/02/2011   MOD [place] TICS, IH, NEXT TCS APR 2018  . COLONOSCOPY WITH PROPOFOL N/A 02/26/2017   Procedure: COLONOSCOPY WITH PROPOFOL;  Surgeon: Danie Binder, MD;  [facility];  Service: Endoscopy;  Laterality: N/A;  11:00am  . COLONOSCOPY WITH PROPOFOL N/A 09/22/2019   Procedure: COLONOSCOPY WITH PROPOFOL;  Surgeon: Danie Binder, MD;  [facility];  Service: Endoscopy;  Laterality: N/A;  1:15  . CYSTO/   RIGHT URETEROSOCOPY LASER LITHOTRIPSY STONE EXTRACTION  10-13-2004  . CYSTO/  RIGHT RETROGRADE PYELOGRAM/  PLACMENT RIGHT URETERAL STENT  01-10-2010  . CYSTOSCOPY W/ URETERAL STENT PLACEMENT Right 08/19/2015   Procedure: CYSTOSCOPY WITH RETROGRADE PYELOGRAM/URETERAL STENT PLACEMENT;  Surgeon: Franchot Gallo, MD;  Location: [facility];  Service: Urology;  Laterality: Right;    . CYSTOSCOPY WITH RETROGRADE PYELOGRAM, URETEROSCOPY AND STENT PLACEMENT Left 10/21/2014   Procedure: CYSTOSCOPY WITH RETROGRADE PYELOGRAM, URETEROSCOPY AND STENT PLACEMENT;  Surgeon: Franchot Gallo, MD;  Location: [facility];  Service: Urology;  Laterality: Left;  . CYSTOSCOPY WITH RETROGRADE PYELOGRAM, URETEROSCOPY AND STENT PLACEMENT Right 11/22/2015   Procedure: CYSTOSCOPY, RIGHT URETERAL STENT REMOVAL; RIGHT RETROGRADE PYELOGRAM, RIGHT URETEROSCOPY WITH BALLOON DILATION; RIGHT URETERAL STENT PLACEMENT;  Surgeon: Franchot Gallo, MD;  Location: [facility];  Service: Urology;  Laterality: Right;  . CYSTOSCOPY/RETROGRADE/URETEROSCOPY/STONE EXTRACTION WITH BASKET Bilateral 08/02/2015   Procedure: CYSTOSCOPY; BILATERAL RETROGRADE PYELOGRAMS; BILATERAL URETEROSCOPY, STONE EXTRACTION WITH BASKET;  Surgeon: Franchot Gallo, MD;  Location: [facility];  Service: Urology;  Laterality: Bilateral;  . CYSTOSCOPY/RETROGRADE/URETEROSCOPY/STONE EXTRACTION WITH BASKET Right 06/28/2015   Procedure: CYSTOSCOPY/RETROGRADE/URETEROSCOPY/STONE EXTRACTION WITH BASKET, RIGHT URETERAL DOUBLE J STENT PLACEMENT;  Surgeon: Franchot Gallo, MD;  [facility]: [facility];  Service: Urology;  Laterality: Right;  . ESOPHAGOGASTRODUODENOSCOPY (EGD) WITH PROPOFOL N/A 03/17/2013   Procedure: ESOPHAGOGASTRODUODENOSCOPY (EGD) WITH PROPOFOL;  Surgeon: Danie Binder, MD;  [facility];  Service: Endoscopy;  Laterality: N/A;  . ESOPHAGOGASTRODUODENOSCOPY (EGD) WITH PROPOFOL N/A 06/10/2015   Distal gastritis, distal esophageal stricture s/p dilation  . ESOPHAGOGASTRODUODENOSCOPY (EGD) WITH PROPOFOL N/A 02/26/2017   Procedure: ESOPHAGOGASTRODUODENOSCOPY (EGD) WITH PROPOFOL;  Surgeon: Danie Binder, MD;  [facility];  Service: Endoscopy;  Laterality: N/A;  . FLEXIBLE SIGMOIDOSCOPY  09/11/2011   HFW:[id number] Internal hemorrhoids  . HOLMIUM LASER APPLICATION Left 10/11/7739   Procedure: HOLMIUM LASER APPLICATION;   Surgeon: Franchot Gallo, MD;  Location: [facility];  Service: Urology;  Laterality: Left;  . HOLMIUM LASER APPLICATION Right 2/87/8676   Procedure: HOLMIUM LASER APPLICATION;  Surgeon: Franchot Gallo, MD;  Location: [facility];  Service: Urology;  Laterality: Right;  . MASS EXCISION Left 03/04/2015   Procedure: EXCISION OF SOFT TISSUE NEOPLASM LEFT ARM;  Surgeon: Aviva Signs, MD;  Location: [facility];  Service: General;  Laterality: Left;  . PERCUTANEOUS NEPHROSTOLITHOTOMY Bilateral 12/  2011     [facility]  . POLYPECTOMY N/A 03/17/2013   Procedure: GASTRIC POLYPECTOMY;  Surgeon: Danie Binder, MD;  Location: [facility];  Service: Endoscopy;  Laterality: N/A;  . POLYPECTOMY  02/26/2017   Procedure: POLYPECTOMY;  Surgeon: Danie Binder, MD;  [facility];  Service: Endoscopy;;  polyp at cecum, ascending colon polyps x3, hepatic flexure polyps x8, transverse colon polyps x8   . POLYPECTOMY  09/22/2019   Procedure: POLYPECTOMY;  Surgeon: Danie Binder, MD;  [facility];  Service: Endoscopy;;  . REMOVAL RIGHT THIGH CYST  2006  . SAVORY DILATION N/A 03/17/2013   Procedure: SAVORY DILATION;  Surgeon: Danie Binder, MD;  Location: [facility];  Service: Endoscopy;  Laterality: N/A;  #12.8, 14, 15, 16 dilators used  . SAVORY DILATION N/A 06/10/2015   Procedure: SAVORY DILATION;  Surgeon: Danie Binder, MD;  [facility];  Service: Endoscopy;  Laterality: N/A;  . TRANSTHORACIC ECHOCARDIOGRAM  01-01-2014   mild LVH/  ef 35-32%/  grade I diastolic dysfunction/  trivial MR, TR, and PR  . VAGINAL HYSTERECTOMY  1970's    SOCIAL HISTORY:  Social History   Socioeconomic History  . Marital status: Divorced    Spouse name: Not on file  . Number of children: 2  . Years of education: Not on file  . Highest education level: Not on file  Occupational History  . Occupation: retired   Tobacco Use  . Smoking status: Former Smoker    Packs/day: 1.00    Years: 20.00      Pack years: 20.00    Types: Cigarettes    Start date: 09/21/1974    Quit date: 09/20/1988    Years since quitting: 31.4  . Smokeless tobacco: Never Used  . Tobacco comment: Quit x 20 years  Vaping Use  . Vaping Use: Never used  Substance and Sexual Activity  . Alcohol use: No    Alcohol/week: 0.0 standard drinks  . Drug use: No  . Sexual activity: Never    Birth control/protection: Surgical  Other Topics Concern  . Not on file  Social History Narrative   Patient is right handed   Patient drinks some caffeine daily.   Social Determinants of Health   Financial Resource Strain:   . Difficulty of Paying Living Expenses: Not on file  Food Insecurity:   . Worried About Charity fundraiser in the Last Year: Not on file  . Ran Out of Food in the Last Year: Not on file  Transportation Needs:   . Lack of Transportation (Medical): Not on file  . Lack of Transportation (Non-Medical): Not on file  Physical Activity:   . Days of Exercise per Week: Not on file  . Minutes of Exercise per Session: Not on file  Stress:   . Feeling of Stress : Not on file  Social Connections:   . Frequency of Communication with Friends and Family: Not on file  . Frequency of Social Gatherings with Friends and Family: Not on file  . Attends Religious Services: Not on file  . Active Member of Clubs or Organizations: Not on file  . Attends Archivist Meetings: Not on file  . Marital Status: Not on file  Intimate Partner Violence:   . Fear of Current or Ex-Partner: Not on file  . Emotionally Abused: Not on file  . Physically Abused: Not on file  . Sexually Abused: Not on file    FAMILY HISTORY:  Family History  Problem Relation Age of Onset  . Hypertension Mother   . Diabetes Mother   . Heart failure Mother   . Dementia Mother   . Emphysema Father   . Hypertension Father   . Diabetes Brother   . GER disease Brother   . Hypertension Sister   . Hypertension Sister   . Cancer Other         family history   . Diabetes Other        family history   . Heart defect Other        famiily history   . Arthritis Other        family history   . Anesthesia problems Neg Hx   . Hypotension Neg Hx   . Malignant hyperthermia Neg Hx   . Pseudochol deficiency Neg Hx   . Colon cancer Neg Hx  CURRENT MEDICATIONS:  Current Outpatient Medications  Medication Sig Dispense Refill  . acetaminophen (TYLENOL) 500 MG tablet Take 1,000 mg by mouth every 6 (six) hours as needed (for pain.).    Marland Kitchen amLODipine (NORVASC) 10 MG tablet Take 1 tablet (10 mg total) by mouth daily. 90 tablet 1  . aspirin EC 81 MG tablet Take 81 mg by mouth daily.    . budesonide-formoterol (SYMBICORT) 80-4.5 MCG/ACT inhaler INHALE 2 PUFFS INTO THE LUNGS TWICE A DAY (Patient taking differently: Inhale 2 puffs into the lungs in the morning and at bedtime. ) 30.6 Inhaler 3  . Cholecalciferol (VITAMIN D3) 50 MCG (2000 UT) TABS Take 2,000 Units by mouth daily.    . diclofenac Sodium (VOLTAREN) 1 % GEL Apply 4 g topically 4 (four) times daily. 4 g 5  . diphenhydramine-acetaminophen (TYLENOL PM) 25-500 MG TABS tablet Take 2 tablets by mouth at bedtime.    . furosemide (LASIX) 40 MG tablet TAKE 1 TABLET BY MOUTH DAILY AS NEEDED FOR SWELLING 90 tablet 1  . glucose blood (ONETOUCH ULTRA) test strip Use as instructed once daily dx e11.9 100 each 5  . linagliptin (TRADJENTA) 5 MG TABS tablet Take 1 tablet (5 mg total) by mouth daily. 90 tablet 1  . OneTouch Delica Lancets 35D MISC Once daily testing dx e11.9 100 each 5  . pantoprazole (PROTONIX) 40 MG tablet TAKE 1 TABLET (40 MG TOTAL) BY MOUTH 2 (TWO) TIMES DAILY. 180 tablet 1  . PARoxetine (PAXIL) 40 MG tablet Take 1 tablet (40 mg total) by mouth every morning. 90 tablet 1  . rosuvastatin (CRESTOR) 20 MG tablet Take 1 tablet (20 mg total) by mouth daily. 90 tablet 3   No current facility-administered medications for this visit.    ALLERGIES:  Allergies  Allergen  Reactions  . Ace Inhibitors Cough  . Keflex [Cephalexin] Diarrhea and Nausea And Vomiting  . Nitrofurantoin Diarrhea and Nausea And Vomiting  . Penicillins Hives and Other (See Comments)    Reaction:  Blisters on hands/feet  Has patient had a PCN reaction causing immediate rash, facial/tongue/throat swelling, SOB or lightheadedness with hypotension: No Has patient had a PCN reaction causing severe rash involving mucus membranes or skin necrosis: No Has patient had a PCN reaction that required hospitalization No Has patient had a PCN reaction occurring within the last 10 years: No If all of the above answers are "NO", then may proceed with Cephalosporin use.    PHYSICAL EXAM:  Performance status (ECOG): 1 - Symptomatic but completely ambulatory  There were no vitals filed for this visit. Wt Readings from Last 3 Encounters:  01/07/20 188 lb (85.3 kg)  12/22/19 191 lb (86.6 kg)  12/07/19 193 lb 0.6 oz (87.6 kg)   Physical Exam Vitals reviewed.  Constitutional:      Appearance: Normal appearance. She is obese.  Neurological:     General: No focal deficit present.     Mental Status: She is alert and oriented to person, place, and time.  Psychiatric:        Mood and Affect: Mood normal.        Behavior: Behavior normal.     LABORATORY DATA:  I have reviewed the labs as listed.  CBC Latest Ref Rng & Units 02/01/2020 09/28/2019 08/14/2019  WBC 4.0 - 10.5 K/uL 8.1 8.7 9.6  Hemoglobin 12.0 - 15.0 g/dL 11.0(L) 11.2(L) 12.9  Hematocrit 36 - 46 % 36.5 36.1 38.8  Platelets 150 - 400 K/uL 383 336 368  CMP Latest Ref Rng & Units 02/01/2020 11/18/2019 09/28/2019  Glucose 70 - 99 mg/dL 158(H) 126(H) 150(H)  BUN 8 - 23 mg/dL 13 12 17   Creatinine 0.44 - 1.00 mg/dL 1.71(H) 1.53(H) 1.73(H)  Sodium 135 - 145 mmol/L 134(L) 137 135  Potassium 3.5 - 5.1 mmol/L 3.8 4.0 3.6  Chloride 98 - 111 mmol/L 102 103 102  CO2 22 - 32 mmol/L 23 27 25   Calcium 8.9 - 10.3 mg/dL 9.8 9.8 9.5  Total Protein 6.5 -  8.1 g/dL 9.0(H) 8.2(H) 8.3(H)  Total Bilirubin 0.3 - 1.2 mg/dL 0.3 0.2 0.3  Alkaline Phos 38 - 126 U/L 84 - 69  AST 15 - 41 U/L 21 15 23   ALT 0 - 44 U/L 15 14 24       Component Value Date/Time   RBC 4.00 02/01/2020 1319   MCV 91.3 02/01/2020 1319   MCH 27.5 02/01/2020 1319   MCHC 30.1 02/01/2020 1319   RDW 16.9 (H) 02/01/2020 1319   LYMPHSABS 1.6 02/01/2020 1319   MONOABS 0.8 02/01/2020 1319   EOSABS 0.6 (H) 02/01/2020 1319   BASOSABS 0.1 02/01/2020 1319   Lab Results  Component Value Date   LDH 108 09/28/2019   LDH 93 (L) 03/27/2019   LDH 99 11/19/2018   Lab Results  Component Value Date   TIBC 514 (H) 02/01/2020   TIBC 440 10/30/2017   TIBC 360 08/02/2017   FERRITIN 9 (L) 02/01/2020   FERRITIN 149 03/11/2018   FERRITIN 381 (H) 12/27/2017   IRONPCTSAT 9 (L) 02/01/2020   IRONPCTSAT 13 10/30/2017   IRONPCTSAT 19 08/02/2017   Lab Results  Component Value Date   TOTALPROTELP 9.2 (H) 02/01/2020   ALBUMINELP 4.0 02/01/2020   A1GS 0.2 02/01/2020   A2GS 1.2 (H) 02/01/2020   BETS 1.4 (H) 02/01/2020   BETA2SER 6.9 (H) 11/22/2010   GAMS 2.3 (H) 02/01/2020   MSPIKE 1.4 (H) 02/01/2020   SPEI Comment 02/01/2020    Lab Results  Component Value Date   KPAFRELGTCHN 70.7 (H) 02/01/2020   LAMBDASER 18.7 02/01/2020   KAPLAMBRATIO 3.78 (H) 02/01/2020    DIAGNOSTIC IMAGING:  I have independently reviewed the scans and discussed with the patient. DG Bone Survey Met  Result Date: 02/01/2020 CLINICAL DATA:  Multiple myeloma low back pain, proximal leg pain EXAM: METASTATIC BONE SURVEY COMPARISON:  11/21/2018 FINDINGS: Minimal left basilar atelectasis. Lungs are otherwise clear. Cardiac size within normal limits. Mild degenerative changes are again identified within the cervical, thoracic, and lumbar spine. 10 mm calculus overlies the expected left renal pelvis and demonstrates slight interval increase in size since prior examination. Several additional calcifications overlie the  lower pole of the left kidney. No focal lytic or blastic bone lesions are identified. IMPRESSION: 1. Slight interval increase in size of left renal calculi, as above. 2. No focal lytic or blastic bone lesions are identified. Electronically Signed   By: Fidela Salisbury MD   On: 02/01/2020 21:48     ASSESSMENT:  1.  IgG kappa smoldering myeloma: -Skeletal survey on 02/01/2020 did not show any lytic lesions.  Slight interval increase in size of the left kidney stone. -M spike is 1.4 g.  Kappa light chain 70.7 and lambda light chains 18.7 with ratio of 3.78.  2.  Normocytic anemia: -She had a colonoscopy on 09/22/2019 which showed polyps in the transverse colon, ascending colon and in the sigmoid colon.  Diverticulosis in the rectosigmoid colon, sigmoid colon and descending colon.  External and internal hemorrhoids  seen.   PLAN:  1.  IgG kappa smoldering myeloma: -Reviewed labs from 02/01/2020.  M spike is 1.4.  Creatinine is 1.71 and calcium 9.8. -Skeletal survey was negative.  No intervention needed except observation. -I have recommended 97-monthfollow-up with repeat labs.  2.  Normocytic anemia: -Combination anemia from iron deficiency and CKD. -Recommend Feraheme weekly x2.  We discussed side effects. -Follow-up in 6 months with labs.   Orders placed this encounter:  No orders of the defined types were placed in this encounter.    SDerek Jack MD [phone]  I, DMilinda Antis am acting as a scribe for Dr. SSanda Linger  I, SDerek JackMD, have reviewed the above documentation for accuracy and completeness, and I agree with the above.

## 2020-02-10 DIAGNOSIS — D509 Iron deficiency anemia, unspecified: Secondary | ICD-10-CM | POA: Insufficient documentation

## 2020-02-12 ENCOUNTER — Inpatient Hospital Stay (HOSPITAL_COMMUNITY): Payer: Medicare Other | Attending: Hematology

## 2020-02-12 ENCOUNTER — Other Ambulatory Visit: Payer: Self-pay

## 2020-02-12 ENCOUNTER — Encounter (HOSPITAL_COMMUNITY): Payer: Self-pay

## 2020-02-12 VITALS — BP 119/62 | HR 86 | Temp 97.6°F | Resp 16

## 2020-02-12 DIAGNOSIS — Z79899 Other long term (current) drug therapy: Secondary | ICD-10-CM | POA: Insufficient documentation

## 2020-02-12 DIAGNOSIS — D508 Other iron deficiency anemias: Secondary | ICD-10-CM

## 2020-02-12 DIAGNOSIS — C9000 Multiple myeloma not having achieved remission: Secondary | ICD-10-CM | POA: Diagnosis not present

## 2020-02-12 DIAGNOSIS — D509 Iron deficiency anemia, unspecified: Secondary | ICD-10-CM

## 2020-02-12 MED ORDER — ACETAMINOPHEN 325 MG PO TABS
650.0000 mg | ORAL_TABLET | Freq: Once | ORAL | Status: AC
  Administered 2020-02-12: 650 mg via ORAL
  Filled 2020-02-12: qty 2

## 2020-02-12 MED ORDER — SODIUM CHLORIDE 0.9 % IV SOLN
510.0000 mg | Freq: Once | INTRAVENOUS | Status: AC
  Administered 2020-02-12: 510 mg via INTRAVENOUS
  Filled 2020-02-12: qty 510

## 2020-02-12 MED ORDER — LORATADINE 10 MG PO TABS
10.0000 mg | ORAL_TABLET | Freq: Once | ORAL | Status: AC
  Administered 2020-02-12: 10 mg via ORAL
  Filled 2020-02-12: qty 1

## 2020-02-12 MED ORDER — SODIUM CHLORIDE 0.9 % IV SOLN: Freq: Once | INTRAVENOUS | Status: AC

## 2020-02-12 MED ORDER — FAMOTIDINE 20 MG PO TABS
20.0000 mg | ORAL_TABLET | Freq: Once | ORAL | Status: AC
  Administered 2020-02-12: 20 mg via ORAL
  Filled 2020-02-12: qty 1

## 2020-02-12 NOTE — Progress Notes (Unsigned)
Patient presents today for Feraheme infusion. Vital signs are stable. Patient has no complaints of any changes since her last visit.

## 2020-02-17 NOTE — Patient Instructions (Signed)
Cheryl Abbott  02/17/2020     @PREFPERIOPPHARMACY @   Your procedure is scheduled on  02/23/2020.  Report to [facility] at  [phone]  A.M.  Call this number if you have problems the morning of surgery:  [phone]   Remember:  Do not eat or drink after midnight.                        Take these medicines the morning of surgery with A SIP OF WATER  Xanax(if needed), amlodipine, protonix, paxil, tramadol(if needed). DO NOT take any medications for diabetes the morning of your procedure.    Do not wear jewelry, make-up or nail polish.  Do not wear lotions, powders, or perfumes. Please wear deodorant and brush your teeth.  Do not shave 48 hours prior to surgery.  Men may shave face and neck.  Do not bring valuables to the hospital.  [facility] is not responsible for any belongings or valuables.  Contacts, dentures or bridgework may not be worn into surgery.  Leave your suitcase in the car.  After surgery it may be brought to your room.  For patients admitted to the hospital, discharge time will be determined by your treatment team.  Patients discharged the day of surgery will not be allowed to drive home.   Name and phone number of your driver:   family Special instructions:  DO NOT smoke the morning of your procedure  Please read over the following fact sheets that you were given. Anesthesia Post-op Instructions and Care and Recovery After Surgery       Cystoscopy Cystoscopy is a procedure that is used to help diagnose and sometimes treat conditions that affect the lower urinary tract. The lower urinary tract includes the bladder and the urethra. The urethra is the tube that drains urine from the bladder. Cystoscopy is done using a thin, tube-shaped instrument with a light and camera at the end (cystoscope). The cystoscope may be hard or flexible, depending on the goal of the procedure. The cystoscope is inserted through the urethra, into the bladder.  Cystoscopy may be recommended if you have:  Urinary tract infections that keep coming back.  Blood in the urine (hematuria).  An inability to control when you urinate (urinary incontinence) or an overactive bladder.  Unusual cells found in a urine sample.  A blockage in the urethra, such as a urinary stone.  Painful urination.  An abnormality in the bladder found during an intravenous pyelogram (IVP) or CT scan. Cystoscopy may also be done to remove a sample of tissue to be examined under a microscope (biopsy). Tell a health care provider about:  Any allergies you have.  All medicines you are taking, including vitamins, herbs, eye drops, creams, and over-the-counter medicines.  Any problems you or family members have had with anesthetic medicines.  Any blood disorders you have.  Any surgeries you have had.  Any medical conditions you have.  Whether you are pregnant or may be pregnant. What are the risks? Generally, this is a safe procedure. However, problems may occur, including:  Infection.  Bleeding.  Allergic reactions to medicines.  Damage to other structures or organs. What happens before the procedure?  Ask your health care provider about: ? Changing or stopping your regular medicines. This is especially important if you are taking diabetes medicines or blood thinners. ? Taking medicines such as aspirin and ibuprofen. These  medicines can thin your blood. Do not take these medicines unless your health care provider tells you to take them. ? Taking over-the-counter medicines, vitamins, herbs, and supplements.  Follow instructions from your health care provider about eating or drinking restrictions.  Ask your health care provider what steps will be taken to help prevent infection. These may include: ? Washing skin with a germ-killing soap. ? Taking antibiotic medicine.  You may have an exam or testing, such as: ? X-rays of the bladder, urethra, or kidneys.  ? Urine tests to check for signs of infection.  Plan to have someone take you home from the hospital or clinic. What happens during the procedure?   You will be given one or more of the following: ? A medicine to help you relax (sedative). ? A medicine to numb the area (local anesthetic).  The area around the opening of your urethra will be cleaned.  The cystoscope will be passed through your urethra into your bladder.  Germ-free (sterile) fluid will flow through the cystoscope to fill your bladder. The fluid will stretch your bladder so that your health care provider can clearly examine your bladder walls.  Your doctor will look at the urethra and bladder. Your doctor may take a biopsy or remove stones.  The cystoscope will be removed, and your bladder will be emptied. The procedure may vary among health care providers and hospitals. What can I expect after the procedure? After the procedure, it is common to have:  Some soreness or pain in your abdomen and urethra.  Urinary symptoms. These include: ? Mild pain or burning when you urinate. Pain should stop within a few minutes after you urinate. This may last for up to 1 week. ? A small amount of blood in your urine for several days. ? Feeling like you need to urinate but producing only a small amount of urine. Follow these instructions at home: Medicines  Take over-the-counter and prescription medicines only as told by your health care provider.  If you were prescribed an antibiotic medicine, take it as told by your health care provider. Do not stop taking the antibiotic even if you start to feel better. General instructions  Return to your normal activities as told by your health care provider. Ask your health care provider what activities are safe for you.  Do not drive for 24 hours if you were given a sedative during your procedure.  Watch for any blood in your urine. If the amount of blood in your urine increases, call  your health care provider.  Follow instructions from your health care provider about eating or drinking restrictions.  If a tissue sample was removed for testing (biopsy) during your procedure, it is up to you to get your test results. Ask your health care provider, or the department that is doing the test, when your results will be ready.  Drink enough fluid to keep your urine pale yellow.  Keep all follow-up visits as told by your health care provider. This is important. Contact a health care provider if you:  Have pain that gets worse or does not get better with medicine, especially pain when you urinate.  Have trouble urinating.  Have more blood in your urine. Get help right away if you:  Have blood clots in your urine.  Have abdominal pain.  Have a fever or chills.  Are unable to urinate. Summary  Cystoscopy is a procedure that is used to help diagnose and sometimes treat conditions that  affect the lower urinary tract.  Cystoscopy is done using a thin, tube-shaped instrument with a light and camera at the end.  After the procedure, it is common to have some soreness or pain in your abdomen and urethra.  Watch for any blood in your urine. If the amount of blood in your urine increases, call your health care provider.  If you were prescribed an antibiotic medicine, take it as told by your health care provider. Do not stop taking the antibiotic even if you start to feel better. This information is not intended to replace advice given to you by your health care provider. Make sure you discuss any questions you have with your health care provider. Document Revised: 05/20/2018 Document Reviewed: 05/20/2018 Elsevier Patient Education  [place].  Ureteral Stent Implantation, Care After This sheet gives you information about how to care for yourself after your procedure. Your health care provider may also give you more specific instructions. If you have problems or  questions, contact your health care provider. What can I expect after the procedure? After the procedure, it is common to have:  Nausea.  Mild pain when you urinate. You may feel this pain in your lower back or lower abdomen. The pain should stop within a few minutes after you urinate. This may last for up to 1 week.  A small amount of blood in your urine for several days. Follow these instructions at home: Medicines  Take over-the-counter and prescription medicines only as told by your health care provider.  If you were prescribed an antibiotic medicine, take it as told by your health care provider. Do not stop taking the antibiotic even if you start to feel better.  Do not drive for 24 hours if you were given a sedative during your procedure.  Ask your health care provider if the medicine prescribed to you requires you to avoid driving or using heavy machinery. Activity  Rest as told by your health care provider.  Avoid sitting for a long time without moving. Get up to take short walks every 1-2 hours. This is important to improve blood flow and breathing. Ask for help if you feel weak or unsteady.  Return to your normal activities as told by your health care provider. Ask your health care provider what activities are safe for you. General instructions   Watch for any blood in your urine. Call your health care provider if the amount of blood in your urine increases.  If you have a catheter: ? Follow instructions from your health care provider about taking care of your catheter and collection bag. ? Do not take baths, swim, or use a hot tub until your health care provider approves. Ask your health care provider if you may take showers. You may only be allowed to take sponge baths.  Drink enough fluid to keep your urine pale yellow.  Do not use any products that contain nicotine or tobacco, such as cigarettes, e-cigarettes, and chewing tobacco. These can delay healing after  surgery. If you need help quitting, ask your health care provider.  Keep all follow-up visits as told by your health care provider. This is important. Contact a health care provider if:  You have pain that gets worse or does not get better with medicine, especially pain when you urinate.  You have difficulty urinating.  You feel nauseous or you vomit repeatedly during a period of more than 2 days after the procedure. Get help right away if:  Your  urine is dark red or has blood clots in it.  You are leaking urine (have incontinence).  The end of the stent comes out of your urethra.  You cannot urinate.  You have sudden, sharp, or severe pain in your abdomen or lower back.  You have a fever.  You have swelling or pain in your legs.  You have difficulty breathing. Summary  After the procedure, it is common to have mild pain when you urinate that goes away within a few minutes after you urinate. This may last for up to 1 week.  Watch for any blood in your urine. Call your health care provider if the amount of blood in your urine increases.  Take over-the-counter and prescription medicines only as told by your health care provider.  Drink enough fluid to keep your urine pale yellow. This information is not intended to replace advice given to you by your health care provider. Make sure you discuss any questions you have with your health care provider. Document Revised: 03/04/2018 Document Reviewed: 03/05/2018 Elsevier Patient Education  [place].  Ureteroscopy Ureteroscopy is a procedure to check for and treat problems inside part of the urinary tract. In this procedure, a thin, tube-shaped instrument with a light at the end (ureteroscope) is used to look at the inside of the kidneys and the ureters, which are the tubes that carry urine from the kidneys to the bladder. The ureteroscope is inserted into one or both of the ureters. You may need this procedure if you have  frequent urinary tract infections (UTIs), blood in your urine, or a stone in one of your ureters. A ureteroscopy can be done to find the cause of urine blockage in a ureter and to evaluate other abnormalities inside the ureters or kidneys. If stones are found, they can be removed during the procedure. Polyps, abnormal tissue, and some types of tumors can also be removed or treated. The ureteroscope may also have a tool to remove tissue to be checked for disease under a microscope (biopsy). Tell a health care provider about:  Any allergies you have.  All medicines you are taking, including vitamins, herbs, eye drops, creams, and over-the-counter medicines.  Any problems you or family members have had with anesthetic medicines.  Any blood disorders you have.  Any surgeries you have had.  Any medical conditions you have.  Whether you are pregnant or may be pregnant. What are the risks? Generally, this is a safe procedure. However, problems may occur, including:  Bleeding.  Infection.  Allergic reactions to medicines.  Scarring that narrows the ureter (stricture).  Creating a hole in the ureter (perforation). What happens before the procedure? Staying hydrated Follow instructions from your health care provider about hydration, which may include:  Up to 2 hours before the procedure - you may continue to drink clear liquids, such as water, clear fruit juice, black coffee, and plain tea. Eating and drinking restrictions Follow instructions from your health care provider about eating and drinking, which may include:  8 hours before the procedure - stop eating heavy meals or foods such as meat, fried foods, or fatty foods.  6 hours before the procedure - stop eating light meals or foods, such as toast or cereal.  6 hours before the procedure - stop drinking milk or drinks that contain milk.  2 hours before the procedure - stop drinking clear liquids. Medicines  Ask your health  care provider about: ? Changing or stopping your regular medicines. This  is especially important if you are taking diabetes medicines or blood thinners. ? Taking medicines such as aspirin and ibuprofen. These medicines can thin your blood. Do not take these medicines before your procedure if your health care provider instructs you not to.  You may be given antibiotic medicine to help prevent infection. General instructions  You may have a urine sample taken to check for infection.  Plan to have someone take you home from the hospital or clinic. What happens during the procedure?   To reduce your risk of infection: ? Your health care team will wash or sanitize their hands. ? Your skin will be washed with soap.  An IV tube will be inserted into one of your veins.  You will be given one of the following: ? A medicine to help you relax (sedative). ? A medicine to make you fall asleep (general anesthetic). ? A medicine that is injected into your spine to numb the area below and slightly above the injection site (spinal anesthetic).  To lower your risk of infection, you may be given an antibiotic medicine by an injection or through the IV tube.  The opening from which you urinate (urethra) will be cleaned with a germ-killing solution.  The ureteroscope will be passed through your urethra into your bladder.  A salt-water solution will flow through the ureteroscope to fill your bladder. This will help the health care provider see the openings of your ureters more clearly.  Then, the ureteroscope will be passed into your ureter. ? If a growth is found, a piece of it may be removed so it can be examined under a microscope (biopsy). ? If a stone is found, it may be removed through the ureteroscope, or the stone may be broken up using a laser, shock waves, or electrical energy. ? In some cases, if the ureter is too small, a tube may be inserted that keeps the ureter open (ureteral stent). The  stent may be left in place for 1 or 2 weeks to keep the ureter open, and then the ureteroscopy procedure will be performed.  The scope will be removed, and your bladder will be emptied. The procedure may vary among health care providers and hospitals. What happens after the procedure?  Your blood pressure, heart rate, breathing rate, and blood oxygen level will be monitored until the medicines you were given have worn off.  You may be asked to urinate.  Donot drive for 24 hours if you were given a sedative. This information is not intended to replace advice given to you by your health care provider. Make sure you discuss any questions you have with your health care provider. Document Revised: 05/10/2017 Document Reviewed: 03/09/2016 Elsevier Patient Education  2020 [place] Anesthesia, Adult, Care After This sheet gives you information about how to care for yourself after your procedure. Your health care provider may also give you more specific instructions. If you have problems or questions, contact your health care provider. What can I expect after the procedure? After the procedure, the following side effects are common:  Pain or discomfort at the IV site.  Nausea.  Vomiting.  Sore throat.  Trouble concentrating.  Feeling cold or chills.  Weak or tired.  Sleepiness and fatigue.  Soreness and body aches. These side effects can affect parts of the body that were not involved in surgery. Follow these instructions at home:  For at least 24 hours after the procedure:  Have a responsible adult stay with  you. It is important to have someone help care for you until you are awake and alert.  Rest as needed.  Do not: ? Participate in activities in which you could fall or become injured. ? Drive. ? Use heavy machinery. ? Drink alcohol. ? Take sleeping pills or medicines that cause drowsiness. ? Make important decisions or sign legal documents. ? Take care of  children on your own. Eating and drinking  Follow any instructions from your health care provider about eating or drinking restrictions.  When you feel hungry, start by eating small amounts of foods that are soft and easy to digest (bland), such as toast. Gradually return to your regular diet.  Drink enough fluid to keep your urine pale yellow.  If you vomit, rehydrate by drinking water, juice, or clear broth. General instructions  If you have sleep apnea, surgery and certain medicines can increase your risk for breathing problems. Follow instructions from your health care provider about wearing your sleep device: ? Anytime you are sleeping, including during daytime naps. ? While taking prescription pain medicines, sleeping medicines, or medicines that make you drowsy.  Return to your normal activities as told by your health care provider. Ask your health care provider what activities are safe for you.  Take over-the-counter and prescription medicines only as told by your health care provider.  If you smoke, do not smoke without supervision.  Keep all follow-up visits as told by your health care provider. This is important. Contact a health care provider if:  You have nausea or vomiting that does not get better with medicine.  You cannot eat or drink without vomiting.  You have pain that does not get better with medicine.  You are unable to pass urine.  You develop a skin rash.  You have a fever.  You have redness around your IV site that gets worse. Get help right away if:  You have difficulty breathing.  You have chest pain.  You have blood in your urine or stool, or you vomit blood. Summary  After the procedure, it is common to have a sore throat or nausea. It is also common to feel tired.  Have a responsible adult stay with you for the first 24 hours after general anesthesia. It is important to have someone help care for you until you are awake and alert.  When  you feel hungry, start by eating small amounts of foods that are soft and easy to digest (bland), such as toast. Gradually return to your regular diet.  Drink enough fluid to keep your urine pale yellow.  Return to your normal activities as told by your health care provider. Ask your health care provider what activities are safe for you. This information is not intended to replace advice given to you by your health care provider. Make sure you discuss any questions you have with your health care provider. Document Revised: 05/31/2017 Document Reviewed: 01/11/2017 Elsevier Patient Education  [place].

## 2020-02-19 ENCOUNTER — Encounter (HOSPITAL_COMMUNITY): Payer: Self-pay

## 2020-02-19 ENCOUNTER — Other Ambulatory Visit: Payer: Self-pay

## 2020-02-19 ENCOUNTER — Encounter (HOSPITAL_COMMUNITY)
Admission: RE | Admit: 2020-02-19 | Discharge: 2020-02-19 | Disposition: A | Discharge status: Home / Self Care | Payer: Medicare Other | Source: Ambulatory Visit | Attending: Urology | Admitting: Urology

## 2020-02-19 ENCOUNTER — Other Ambulatory Visit (HOSPITAL_COMMUNITY)
Admission: RE | Admit: 2020-02-19 | Discharge: 2020-02-19 | Disposition: A | Discharge status: Home / Self Care | Payer: Medicare Other | Source: Ambulatory Visit | Attending: Urology | Admitting: Urology

## 2020-02-19 ENCOUNTER — Inpatient Hospital Stay (HOSPITAL_COMMUNITY): Payer: Medicare Other

## 2020-02-19 VITALS — BP 122/71 | HR 89 | Temp 97.2°F | Resp 18

## 2020-02-19 DIAGNOSIS — C9000 Multiple myeloma not having achieved remission: Secondary | ICD-10-CM | POA: Diagnosis not present

## 2020-02-19 DIAGNOSIS — D509 Iron deficiency anemia, unspecified: Secondary | ICD-10-CM

## 2020-02-19 DIAGNOSIS — Z01818 Encounter for other preprocedural examination: Secondary | ICD-10-CM | POA: Insufficient documentation

## 2020-02-19 DIAGNOSIS — Z20822 Contact with and (suspected) exposure to covid-19: Secondary | ICD-10-CM | POA: Diagnosis not present

## 2020-02-19 DIAGNOSIS — D508 Other iron deficiency anemias: Secondary | ICD-10-CM

## 2020-02-19 DIAGNOSIS — Z79899 Other long term (current) drug therapy: Secondary | ICD-10-CM | POA: Diagnosis not present

## 2020-02-19 LAB — SARS CORONAVIRUS 2 (TAT 6-24 HRS): SARS Coronavirus 2: NEGATIVE

## 2020-02-19 MED ORDER — SODIUM CHLORIDE 0.9 % IV SOLN: Freq: Once | INTRAVENOUS | Status: AC

## 2020-02-19 MED ORDER — SODIUM CHLORIDE 0.9 % IV SOLN
510.0000 mg | Freq: Once | INTRAVENOUS | Status: AC
  Administered 2020-02-19: 510 mg via INTRAVENOUS
  Filled 2020-02-19: qty 510

## 2020-02-19 MED ORDER — FAMOTIDINE 20 MG PO TABS
20.0000 mg | ORAL_TABLET | Freq: Once | ORAL | Status: AC
  Administered 2020-02-19: 20 mg via ORAL

## 2020-02-19 MED ORDER — ACETAMINOPHEN 325 MG PO TABS
ORAL_TABLET | ORAL | Status: AC
  Filled 2020-02-19: qty 2

## 2020-02-19 MED ORDER — LORATADINE 10 MG PO TABS
10.0000 mg | ORAL_TABLET | Freq: Once | ORAL | Status: AC
  Administered 2020-02-19: 10 mg via ORAL

## 2020-02-19 MED ORDER — ACETAMINOPHEN 325 MG PO TABS
650.0000 mg | ORAL_TABLET | Freq: Once | ORAL | Status: AC
  Administered 2020-02-19: 650 mg via ORAL

## 2020-02-19 MED ORDER — LORATADINE 10 MG PO TABS
ORAL_TABLET | ORAL | Status: AC
  Filled 2020-02-19: qty 1

## 2020-02-19 MED ORDER — FAMOTIDINE 20 MG PO TABS
ORAL_TABLET | ORAL | Status: AC
  Filled 2020-02-19: qty 1

## 2020-02-19 NOTE — Progress Notes (Addendum)
Sent a message via office mail to Dr Alyson Ingles and Gibson Ramp asking if Ms. Rookstool needs to hold plavix

## 2020-02-19 NOTE — Progress Notes (Signed)
Patient presents today for Feraheme infusion. MAR reviewed and updated. Vital signs stable. Patient has no complaints of any pain today. Patient denies any changes since his last visit.   Treatment given today per MD orders. Tolerated infusion without adverse affects. Vital signs stable. No complaints at this time. Discharged from clinic via wheel chair. F/U with [facility] as scheduled.

## 2020-02-19 NOTE — Patient Instructions (Signed)
[facility]  Discharge Instructions:   _______________________________________________________________  Thank you for choosing [facility] at [facility] to provide your oncology and hematology care.  To afford each patient quality time with our providers, please arrive at least 15 minutes before your scheduled appointment.  You need to re-schedule your appointment if you arrive 10 or more minutes late.  We strive to give you quality time with our providers, and arriving late affects you and other patients whose appointments are after yours.  Also, if you no show three or more times for appointments you may be dismissed from the clinic.  Again, thank you for choosing [facility]. Our hope is that these requests will allow you access to exceptional care and in a timely manner. _______________________________________________________________  If you have questions after your visit, please contact our office at [phone] between the hours of 8:30 a.m. and 5:00 p.m. Voicemails left after 4:30 p.m. will not be returned until the following business day. _______________________________________________________________  For prescription refill requests, have your pharmacy contact our office. _______________________________________________________________  Recommendations made by the consultant and any test results will be sent to your referring physician. _______________________________________________________________ 

## 2020-02-22 ENCOUNTER — Ambulatory Visit (HOSPITAL_COMMUNITY): Payer: Medicare Other

## 2020-02-22 NOTE — Anesthesia Preprocedure Evaluation (Addendum)
Anesthesia Evaluation  Patient identified by MRN, date of birth, ID band Patient awake    Reviewed: Allergy & Precautions, NPO status , Patient's Chart, lab work & pertinent test results  History of Anesthesia Complications Negative for: history of anesthetic complications  Airway Mallampati: II  TM Distance: >3 FB Neck ROM: Full    Dental  (+) Dental Advisory Given, Teeth Intact   Pulmonary sleep apnea and Continuous Positive Airway Pressure Ventilation , former smoker,    Pulmonary exam normal breath sounds clear to auscultation       Cardiovascular Exercise Tolerance: Good hypertension, Pt. on medications Normal cardiovascular exam Rhythm:Regular Rate:Normal     Neuro/Psych  Headaches, PSYCHIATRIC DISORDERS Anxiety Depression TIA Neuromuscular disease    GI/Hepatic Neg liver ROS, GERD  Medicated and Controlled,  Endo/Other  diabetes, Well Controlled, Type 2, Oral Hypoglycemic Agents  Renal/GU Renal InsufficiencyRenal disease     Musculoskeletal  (+) Arthritis , Osteoarthritis,    Abdominal   Peds  Hematology  (+) anemia ,   Anesthesia Other Findings   Reproductive/Obstetrics negative OB ROS                                                           Anesthesia Evaluation  Patient identified by MRN, date of birth, ID band Patient awake    Reviewed: Allergy & Precautions, NPO status , Patient's Chart, lab work & pertinent test results  Airway Mallampati: II  TM Distance: >3 FB Neck ROM: Full    Dental  (+) Dental Advisory Given   Pulmonary shortness of breath and with exertion, sleep apnea (noncomplaint with CPAP) and Continuous Positive Airway Pressure Ventilation , former smoker,    Pulmonary exam normal breath sounds clear to auscultation       Cardiovascular METS: 3 - Mets hypertension, Pt. on medications Normal cardiovascular exam Rhythm:Regular Rate:Normal      Neuro/Psych  Headaches, PSYCHIATRIC DISORDERS Anxiety Depression TIA Neuromuscular disease (LEG CRAMPS)    GI/Hepatic GERD  Medicated and Controlled,  Endo/Other  diabetes, Well Controlled, Type 2, Oral Hypoglycemic Agents  Renal/GU Renal InsufficiencyRenal disease     Musculoskeletal  (+) Arthritis , Osteoarthritis,    Abdominal   Peds  Hematology  (+) anemia ,   Anesthesia Other Findings   Reproductive/Obstetrics negative OB ROS                             Anesthesia Physical Anesthesia Plan  ASA: III  Anesthesia Plan: General   Post-op Pain Management:    Induction: Intravenous  PONV Risk Score and Plan: 2  Airway Management Planned: Nasal Cannula, Natural Airway and Simple Face Mask  Additional Equipment:   Intra-op Plan:   Post-operative Plan:   Informed Consent: I have reviewed the patients History and Physical, chart, labs and discussed the procedure including the risks, benefits and alternatives for the proposed anesthesia with the patient or authorized representative who has indicated his/her understanding and acceptance.     Dental advisory given  Plan Discussed with: CRNA and Surgeon  Anesthesia Plan Comments:         Anesthesia Quick Evaluation  Anesthesia Evaluation  Patient identified by MRN, date of birth, ID band Patient awake    Reviewed: Allergy & Precautions, NPO status , Patient's Chart, lab work & pertinent test results  Airway Mallampati: II  TM Distance: >3 FB Neck ROM: Full    Dental  (+) Dental Advisory Given   Pulmonary shortness of breath and with exertion, sleep apnea (noncomplaint with CPAP) and Continuous Positive Airway Pressure Ventilation , former smoker,    Pulmonary exam normal breath sounds clear to auscultation       Cardiovascular METS: 3 - Mets hypertension, Pt. on medications Normal cardiovascular exam Rhythm:Regular  Rate:Normal     Neuro/Psych  Headaches, PSYCHIATRIC DISORDERS Anxiety Depression TIA Neuromuscular disease (LEG CRAMPS)    GI/Hepatic GERD  Medicated and Controlled,  Endo/Other  diabetes, Well Controlled, Type 2, Oral Hypoglycemic Agents  Renal/GU Renal InsufficiencyRenal disease     Musculoskeletal  (+) Arthritis , Osteoarthritis,    Abdominal   Peds  Hematology  (+) anemia ,   Anesthesia Other Findings   Reproductive/Obstetrics negative OB ROS                             Anesthesia Physical Anesthesia Plan  ASA: III  Anesthesia Plan: General   Post-op Pain Management:    Induction: Intravenous  PONV Risk Score and Plan: 2  Airway Management Planned: Nasal Cannula, Natural Airway and Simple Face Mask  Additional Equipment:   Intra-op Plan:   Post-operative Plan:   Informed Consent: I have reviewed the patients History and Physical, chart, labs and discussed the procedure including the risks, benefits and alternatives for the proposed anesthesia with the patient or authorized representative who has indicated his/her understanding and acceptance.     Dental advisory given  Plan Discussed with: CRNA and Surgeon  Anesthesia Plan Comments:         Anesthesia Quick Evaluation  Anesthesia Physical Anesthesia Plan  ASA: III  Anesthesia Plan: General   Post-op Pain Management:    Induction: Intravenous  PONV Risk Score and Plan: 4 or greater and Ondansetron, Dexamethasone and Midazolam  Airway Management Planned: LMA  Additional Equipment:   Intra-op Plan:   Post-operative Plan:   Informed Consent: I have reviewed the patients History and Physical, chart, labs and discussed the procedure including the risks, benefits and alternatives for the proposed anesthesia with the patient or authorized representative who has indicated his/her understanding and acceptance.     Dental advisory given  Plan Discussed with:  CRNA and Surgeon  Anesthesia Plan Comments:        Anesthesia Quick Evaluation

## 2020-02-23 ENCOUNTER — Ambulatory Visit (HOSPITAL_COMMUNITY): Payer: Medicare Other

## 2020-02-23 ENCOUNTER — Ambulatory Visit (HOSPITAL_COMMUNITY)
Admission: RE | Admit: 2020-02-23 | Discharge: 2020-02-23 | Disposition: A | Discharge status: Home / Self Care | Payer: Medicare Other | Attending: Urology | Admitting: Urology

## 2020-02-23 ENCOUNTER — Ambulatory Visit (HOSPITAL_COMMUNITY): Payer: Medicare Other | Admitting: Certified Registered Nurse Anesthetist

## 2020-02-23 ENCOUNTER — Encounter (HOSPITAL_COMMUNITY): Payer: Self-pay | Admitting: Urology

## 2020-02-23 ENCOUNTER — Encounter (HOSPITAL_COMMUNITY)
Admission: RE | Disposition: A | Discharge status: Home / Self Care | Payer: Self-pay | Source: Home / Self Care | Attending: Urology

## 2020-02-23 DIAGNOSIS — F329 Major depressive disorder, single episode, unspecified: Secondary | ICD-10-CM | POA: Insufficient documentation

## 2020-02-23 DIAGNOSIS — Z7902 Long term (current) use of antithrombotics/antiplatelets: Secondary | ICD-10-CM | POA: Insufficient documentation

## 2020-02-23 DIAGNOSIS — N200 Calculus of kidney: Secondary | ICD-10-CM | POA: Diagnosis not present

## 2020-02-23 DIAGNOSIS — Z79899 Other long term (current) drug therapy: Secondary | ICD-10-CM | POA: Insufficient documentation

## 2020-02-23 DIAGNOSIS — K219 Gastro-esophageal reflux disease without esophagitis: Secondary | ICD-10-CM | POA: Insufficient documentation

## 2020-02-23 DIAGNOSIS — Z7984 Long term (current) use of oral hypoglycemic drugs: Secondary | ICD-10-CM | POA: Insufficient documentation

## 2020-02-23 DIAGNOSIS — I10 Essential (primary) hypertension: Secondary | ICD-10-CM | POA: Diagnosis not present

## 2020-02-23 DIAGNOSIS — Z888 Allergy status to other drugs, medicaments and biological substances status: Secondary | ICD-10-CM | POA: Insufficient documentation

## 2020-02-23 DIAGNOSIS — Z7982 Long term (current) use of aspirin: Secondary | ICD-10-CM | POA: Diagnosis not present

## 2020-02-23 DIAGNOSIS — M1990 Unspecified osteoarthritis, unspecified site: Secondary | ICD-10-CM | POA: Insufficient documentation

## 2020-02-23 DIAGNOSIS — Z791 Long term (current) use of non-steroidal anti-inflammatories (NSAID): Secondary | ICD-10-CM | POA: Diagnosis not present

## 2020-02-23 DIAGNOSIS — E1142 Type 2 diabetes mellitus with diabetic polyneuropathy: Secondary | ICD-10-CM | POA: Insufficient documentation

## 2020-02-23 DIAGNOSIS — I129 Hypertensive chronic kidney disease with stage 1 through stage 4 chronic kidney disease, or unspecified chronic kidney disease: Secondary | ICD-10-CM | POA: Diagnosis not present

## 2020-02-23 DIAGNOSIS — Z87891 Personal history of nicotine dependence: Secondary | ICD-10-CM | POA: Diagnosis not present

## 2020-02-23 DIAGNOSIS — E785 Hyperlipidemia, unspecified: Secondary | ICD-10-CM | POA: Diagnosis not present

## 2020-02-23 DIAGNOSIS — Z8673 Personal history of transient ischemic attack (TIA), and cerebral infarction without residual deficits: Secondary | ICD-10-CM | POA: Diagnosis not present

## 2020-02-23 DIAGNOSIS — G4733 Obstructive sleep apnea (adult) (pediatric): Secondary | ICD-10-CM | POA: Diagnosis not present

## 2020-02-23 DIAGNOSIS — Z7951 Long term (current) use of inhaled steroids: Secondary | ICD-10-CM | POA: Insufficient documentation

## 2020-02-23 DIAGNOSIS — Z881 Allergy status to other antibiotic agents status: Secondary | ICD-10-CM | POA: Diagnosis not present

## 2020-02-23 DIAGNOSIS — Z8579 Personal history of other malignant neoplasms of lymphoid, hematopoietic and related tissues: Secondary | ICD-10-CM | POA: Diagnosis not present

## 2020-02-23 DIAGNOSIS — N189 Chronic kidney disease, unspecified: Secondary | ICD-10-CM | POA: Diagnosis not present

## 2020-02-23 DIAGNOSIS — Z880 Allergy status to penicillin: Secondary | ICD-10-CM | POA: Diagnosis not present

## 2020-02-23 DIAGNOSIS — F419 Anxiety disorder, unspecified: Secondary | ICD-10-CM | POA: Insufficient documentation

## 2020-02-23 DIAGNOSIS — N201 Calculus of ureter: Secondary | ICD-10-CM

## 2020-02-23 DIAGNOSIS — E1122 Type 2 diabetes mellitus with diabetic chronic kidney disease: Secondary | ICD-10-CM | POA: Diagnosis not present

## 2020-02-23 HISTORY — PX: HOLMIUM LASER APPLICATION: SHX5852

## 2020-02-23 HISTORY — PX: CYSTOSCOPY W/ URETERAL STENT PLACEMENT: SHX1429

## 2020-02-23 LAB — GLUCOSE, CAPILLARY: Glucose-Capillary: 108 mg/dL — ABNORMAL HIGH (ref 70–99)

## 2020-02-23 SURGERY — CYSTOSCOPY, WITH RETROGRADE PYELOGRAM AND URETERAL STENT INSERTION
Anesthesia: General | Laterality: Left

## 2020-02-23 MED ORDER — CHLORHEXIDINE GLUCONATE 0.12 % MT SOLN
15.0000 mL | Freq: Once | OROMUCOSAL | Status: AC
  Administered 2020-02-23: 15 mL via OROMUCOSAL
  Filled 2020-02-23: qty 15

## 2020-02-23 MED ORDER — EPHEDRINE SULFATE-NACL 50-0.9 MG/10ML-% IV SOSY
PREFILLED_SYRINGE | INTRAVENOUS | Status: DC | PRN
  Administered 2020-02-23 (×3): 10 mg via INTRAVENOUS
  Administered 2020-02-23: 5 mg via INTRAVENOUS

## 2020-02-23 MED ORDER — HYDROMORPHONE HCL 1 MG/ML IJ SOLN
0.2500 mg | INTRAMUSCULAR | Status: DC | PRN
  Administered 2020-02-23 (×2): 0.5 mg via INTRAVENOUS
  Filled 2020-02-23 (×2): qty 0.5

## 2020-02-23 MED ORDER — ONDANSETRON HCL 4 MG/2ML IJ SOLN: 4.0000 mg | Freq: Once | INTRAMUSCULAR | Status: DC | PRN

## 2020-02-23 MED ORDER — STERILE WATER FOR IRRIGATION IR SOLN
Status: DC | PRN
  Administered 2020-02-23: 500 mL

## 2020-02-23 MED ORDER — DEXAMETHASONE SODIUM PHOSPHATE 10 MG/ML IJ SOLN
INTRAMUSCULAR | Status: DC | PRN
  Administered 2020-02-23: 10 mg via INTRAVENOUS

## 2020-02-23 MED ORDER — ONDANSETRON HCL 4 MG/2ML IJ SOLN
INTRAMUSCULAR | Status: AC
  Filled 2020-02-23: qty 2

## 2020-02-23 MED ORDER — PROPOFOL 10 MG/ML IV BOLUS
INTRAVENOUS | Status: AC
  Filled 2020-02-23: qty 40

## 2020-02-23 MED ORDER — DIATRIZOATE MEGLUMINE 30 % UR SOLN
URETHRAL | Status: AC
  Filled 2020-02-23: qty 100

## 2020-02-23 MED ORDER — ORAL CARE MOUTH RINSE: 15.0000 mL | Freq: Once | OROMUCOSAL | Status: AC

## 2020-02-23 MED ORDER — FENTANYL CITRATE (PF) 100 MCG/2ML IJ SOLN
INTRAMUSCULAR | Status: AC
  Filled 2020-02-23: qty 2

## 2020-02-23 MED ORDER — LIDOCAINE 2% (20 MG/ML) 5 ML SYRINGE
INTRAMUSCULAR | Status: DC | PRN
  Administered 2020-02-23: 80 mg via INTRAVENOUS

## 2020-02-23 MED ORDER — PROPOFOL 10 MG/ML IV BOLUS
INTRAVENOUS | Status: DC | PRN
  Administered 2020-02-23: 200 mg via INTRAVENOUS

## 2020-02-23 MED ORDER — PHENYLEPHRINE 40 MCG/ML (10ML) SYRINGE FOR IV PUSH (FOR BLOOD PRESSURE SUPPORT)
PREFILLED_SYRINGE | INTRAVENOUS | Status: AC
  Filled 2020-02-23: qty 10

## 2020-02-23 MED ORDER — OXYBUTYNIN CHLORIDE 5 MG PO TABS: 5.0000 mg | ORAL_TABLET | Freq: Three times a day (TID) | ORAL | 0 refills | Status: DC | PRN

## 2020-02-23 MED ORDER — DIATRIZOATE MEGLUMINE 30 % UR SOLN
URETHRAL | Status: DC | PRN
  Administered 2020-02-23: 20 mL via URETHRAL

## 2020-02-23 MED ORDER — LACTATED RINGERS IV SOLN
Freq: Once | INTRAVENOUS | Status: AC
  Administered 2020-02-23: 1000 mL via INTRAVENOUS

## 2020-02-23 MED ORDER — EPHEDRINE 5 MG/ML INJ
INTRAVENOUS | Status: AC
  Filled 2020-02-23: qty 10

## 2020-02-23 MED ORDER — DEXAMETHASONE SODIUM PHOSPHATE 10 MG/ML IJ SOLN
INTRAMUSCULAR | Status: AC
  Filled 2020-02-23: qty 1

## 2020-02-23 MED ORDER — CIPROFLOXACIN IN D5W 400 MG/200ML IV SOLN
400.0000 mg | INTRAVENOUS | Status: AC
  Administered 2020-02-23: 400 mg via INTRAVENOUS
  Filled 2020-02-23: qty 200

## 2020-02-23 MED ORDER — PHENYLEPHRINE 40 MCG/ML (10ML) SYRINGE FOR IV PUSH (FOR BLOOD PRESSURE SUPPORT)
PREFILLED_SYRINGE | INTRAVENOUS | Status: DC | PRN
  Administered 2020-02-23: 40 ug via INTRAVENOUS
  Administered 2020-02-23 (×3): 80 ug via INTRAVENOUS
  Administered 2020-02-23: 120 ug via INTRAVENOUS
  Administered 2020-02-23: 80 ug via INTRAVENOUS

## 2020-02-23 MED ORDER — SULFAMETHOXAZOLE-TRIMETHOPRIM 800-160 MG PO TABS: 1.0000 | ORAL_TABLET | Freq: Two times a day (BID) | ORAL | 0 refills | Status: DC

## 2020-02-23 MED ORDER — FENTANYL CITRATE (PF) 100 MCG/2ML IJ SOLN
INTRAMUSCULAR | Status: DC | PRN
Start: 2020-02-23 — End: 2020-02-23
  Administered 2020-02-23: 25 ug via INTRAVENOUS
  Administered 2020-02-23: 50 ug via INTRAVENOUS
  Administered 2020-02-23: 25 ug via INTRAVENOUS

## 2020-02-23 MED ORDER — ONDANSETRON HCL 4 MG/2ML IJ SOLN
INTRAMUSCULAR | Status: DC | PRN
  Administered 2020-02-23: 4 mg via INTRAVENOUS

## 2020-02-23 MED ORDER — LIDOCAINE 2% (20 MG/ML) 5 ML SYRINGE
INTRAMUSCULAR | Status: AC
  Filled 2020-02-23: qty 5

## 2020-02-23 MED ORDER — SODIUM CHLORIDE 0.9 % IR SOLN
Status: DC | PRN
  Administered 2020-02-23 (×2): 3000 mL via INTRAVESICAL

## 2020-02-23 SURGICAL SUPPLY — 31 items
BAG DRAIN URO TABLE W/ADPT NS (BAG) ×3 IMPLANT
BAG DRN 8 ADPR NS SKTRN CSTL (BAG) ×2
BASKET ZERO TIP NITINOL 2.4FR (BASKET) ×1 IMPLANT
BSKT STON RTRVL ZERO TP 2.4FR (BASKET) ×2
CATH INTERMIT  6FR 70CM (CATHETERS) ×3 IMPLANT
CLOTH BEACON ORANGE TIMEOUT ST (SAFETY) ×3 IMPLANT
DECANTER SPIKE VIAL GLASS SM (MISCELLANEOUS) ×3 IMPLANT
EXTRACTOR STONE NITINOL NGAGE (UROLOGICAL SUPPLIES) ×1 IMPLANT
FIBER LASER FLEXIVA 200 (UROLOGICAL SUPPLIES) ×1 IMPLANT
GLOVE BIOGEL M 6.5 STRL (GLOVE) ×1 IMPLANT
GLOVE BIOGEL M 8.0 STRL (GLOVE) ×3 IMPLANT
GLOVE BIOGEL PI IND STRL 7.0 (GLOVE) ×2 IMPLANT
GLOVE BIOGEL PI IND STRL 7.5 (GLOVE) IMPLANT
GLOVE BIOGEL PI INDICATOR 7.0 (GLOVE) ×2
GLOVE BIOGEL PI INDICATOR 7.5 (GLOVE) ×1
GLOVE SS BIOGEL STRL SZ 6.5 (GLOVE) IMPLANT
GLOVE SS BIOGEL STRL SZ 7 (GLOVE) IMPLANT
GLOVE SUPERSENSE BIOGEL SZ 6.5 (GLOVE) ×1
GLOVE SUPERSENSE BIOGEL SZ 7 (GLOVE) ×2
GOWN STRL REIN XL XLG (GOWN DISPOSABLE) ×3 IMPLANT
GOWN STRL REUS W/TWL LRG LVL3 (GOWN DISPOSABLE) ×3 IMPLANT
GUIDEWIRE STR DUAL SENSOR (WIRE) ×3 IMPLANT
IV NS IRRIG 3000ML ARTHROMATIC (IV SOLUTION) ×2 IMPLANT
KIT TURNOVER CYSTO (KITS) ×3 IMPLANT
MANIFOLD NEPTUNE II (INSTRUMENTS) ×3 IMPLANT
PACK CYSTO (CUSTOM PROCEDURE TRAY) ×3 IMPLANT
PAD ARMBOARD 7.5X6 YLW CONV (MISCELLANEOUS) ×3 IMPLANT
SHEATH URETERAL 12FRX35CM (MISCELLANEOUS) ×1 IMPLANT
STENT URET 6FRX26 CONTOUR (STENTS) ×1 IMPLANT
TOWEL OR 17X26 4PK STRL BLUE (TOWEL DISPOSABLE) ×3 IMPLANT
WATER STERILE IRR 500ML POUR (IV SOLUTION) ×3 IMPLANT

## 2020-02-23 NOTE — Transfer of Care (Signed)
Immediate Anesthesia Transfer of Care Note  Patient: Cheryl Abbott  Procedure(s) Performed: CYSTOSCOPY LEFT  RETROGRADE PYELOGRAM LEFT URETEROSCOPY URETERAL STENT PLACEMENT (Left ) HOLMIUM LASER APPLICATION  Patient Location: PACU  Anesthesia Type:General  Level of Consciousness: awake, alert  and oriented  Airway & Oxygen Therapy: Patient Spontanous Breathing and Patient connected to face mask oxygen  Post-op Assessment: Report given to RN and Post -op Vital signs reviewed and stable  Post vital signs: Reviewed and stable  Last Vitals:  Vitals Value Taken Time  BP 115/63 02/23/20 0846  Temp    Pulse 94 02/23/20 0849  Resp 20 02/23/20 0849  SpO2 92 % 02/23/20 0849  Vitals shown include unvalidated device data.  Last Pain:  Vitals:   02/23/20 0651  PainSc: 0-No pain         Complications: No complications documented.

## 2020-02-23 NOTE — H&P (Signed)
H&P  Chief Complaint: Left-sided kidney stone  History of Present Illness: Cheryl Abbott is a 74 y.o. year old female who presents for ureteroscopic management of a symptomatic left renal pelvic stone.    Past Medical History:  Diagnosis Date  . Abnormal brain scan 02/07/2012  . Anemia   . Anxiety   . Anxiety disorder   . Arthritis   . Brain TIA 08/19/2015  . Cancer ([place])    acute myeloma  . 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 Chronic back pain   . Depression   . Fatty liver   . GERD (gastroesophageal reflux disease)   . Headache   . HEMORRHOIDS 03/15/2009   OCT 2014 HB 12. 9    . History of adenomatous polyp of colon    2008  . History of kidney stones   . Hypercalcemia   . Hypercalcemia 11/30/2010  . Hyperlipidemia   . Hypertension   . Lactose intolerance in adult 02/01/2016  . Left ureteral stone   . Lipoma of arm 01/19/2015  . Lumbar disc disease with radiculopathy   . Nephrolithiasis   . Neuropathic pain of both feet 04/11/2016  . Neuropathy, peripheral   . Nocturnal hypoxia 02/04/2014   Study confirms this done 02/10/2014   . OSA treated with BiPAP    per study 2007  . RENAL CALCULUS 03/15/2009   Qualifier: Diagnosis of  By: Zeb Comfort    . Sciatica   . Shoulder pain, bilateral   . Sigmoid diverticulosis   . TIA (transient ischemic attack) 02/26/2016  . Type 2 diabetes mellitus ([place])   . Vaginal dryness, menopausal 10/14/2016    Past Surgical History:  Procedure Laterality Date  . BIOPSY N/A 03/17/2013   Procedure: GASTRIC BIOPSIES;  Surgeon: Danie Binder, MD;  Location: [facility];  Service: Endoscopy;  Laterality: N/A;  . BIOPSY  06/10/2015   Procedure: BIOPSY;  Surgeon: Danie Binder, MD;  [facility];  Service: Endoscopy;;  gastric biopsy  . CARDIAC CATHETERIZATION  05-11-2003  dr Shelva Majestic ([facility])   Abnormal cardiolite/   Normal coronary arteries and normal LVF,  ef  63%  . CARDIOVASCULAR STRESS TEST  01-01-2014    normal lexiscan cardiolite/  no ischemia/ infarct/  normal LV function and wall motion ,  ef 81%  . COLONOSCOPY  last one 10/02/2011   MOD [place] TICS, IH, NEXT TCS APR 2018  . COLONOSCOPY WITH PROPOFOL N/A 02/26/2017   Procedure: COLONOSCOPY WITH PROPOFOL;  Surgeon: Danie Binder, MD;  [facility];  Service: Endoscopy;  Laterality: N/A;  11:00am  . COLONOSCOPY WITH PROPOFOL N/A 09/22/2019   Procedure: COLONOSCOPY WITH PROPOFOL;  Surgeon: Danie Binder, MD;  [facility];  Service: Endoscopy;  Laterality: N/A;  1:15  . CYSTO/   RIGHT URETEROSOCOPY LASER LITHOTRIPSY STONE EXTRACTION  10-13-2004  . CYSTO/  RIGHT RETROGRADE PYELOGRAM/  PLACMENT RIGHT URETERAL STENT  01-10-2010  . CYSTOSCOPY W/ URETERAL STENT PLACEMENT Right 08/19/2015   Procedure: CYSTOSCOPY WITH RETROGRADE PYELOGRAM/URETERAL STENT PLACEMENT;  Surgeon: Franchot Gallo, MD;  Location: [facility];  Service: Urology;  Laterality: Right;  . CYSTOSCOPY WITH RETROGRADE PYELOGRAM, URETEROSCOPY AND STENT PLACEMENT Left 10/21/2014   Procedure: CYSTOSCOPY WITH RETROGRADE PYELOGRAM, URETEROSCOPY AND STENT PLACEMENT;  Surgeon: Franchot Gallo, MD;  Location: [facility];  Service: Urology;  Laterality: Left;  . CYSTOSCOPY WITH RETROGRADE PYELOGRAM, URETEROSCOPY AND STENT PLACEMENT Right 11/22/2015   Procedure: CYSTOSCOPY, RIGHT URETERAL STENT  REMOVAL; RIGHT RETROGRADE PYELOGRAM, RIGHT URETEROSCOPY WITH BALLOON DILATION; RIGHT URETERAL STENT PLACEMENT;  Surgeon: Franchot Gallo, MD;  Location: [facility];  Service: Urology;  Laterality: Right;  . CYSTOSCOPY/RETROGRADE/URETEROSCOPY/STONE EXTRACTION WITH BASKET Bilateral 08/02/2015   Procedure: CYSTOSCOPY; BILATERAL RETROGRADE PYELOGRAMS; BILATERAL URETEROSCOPY, STONE EXTRACTION WITH BASKET;  Surgeon: Franchot Gallo, MD;  Location[facility];  Service: Urology;  Laterality: Bilateral;  . CYSTOSCOPY/RETROGRADE/URETEROSCOPY/STONE EXTRACTION WITH BASKET Right 06/28/2015    Procedure: CYSTOSCOPY/RETROGRADE/URETEROSCOPY/STONE EXTRACTION WITH BASKET, RIGHT URETERAL DOUBLE J STENT PLACEMENT;  Surgeon: Franchot Gallo, MD;  [facility];  Service: Urology;  Laterality: Right;  . ESOPHAGOGASTRODUODENOSCOPY (EGD) WITH PROPOFOL N/A 03/17/2013   Procedure: ESOPHAGOGASTRODUODENOSCOPY (EGD) WITH PROPOFOL;  Surgeon: Danie Binder, MD;  [facility];  Service: Endoscopy;  Laterality: N/A;  . ESOPHAGOGASTRODUODENOSCOPY (EGD) WITH PROPOFOL N/A 06/10/2015   Distal gastritis, distal esophageal stricture s/p dilation  . ESOPHAGOGASTRODUODENOSCOPY (EGD) WITH PROPOFOL N/A 02/26/2017   Procedure: ESOPHAGOGASTRODUODENOSCOPY (EGD) WITH PROPOFOL;  Surgeon: Danie Binder, MD;  [facility];  Service: Endoscopy;  Laterality: N/A;  . FLEXIBLE SIGMOIDOSCOPY  09/11/2011   [facility]:[id number] Internal hemorrhoids  . HOLMIUM LASER APPLICATION Left 3/66/2947   Procedure: HOLMIUM LASER APPLICATION;  Surgeon: Franchot Gallo, MD;  Location: [facility];  Service: Urology;  Laterality: Left;  . HOLMIUM LASER APPLICATION Right 6/54/6503   Procedure: HOLMIUM LASER APPLICATION;  Surgeon: Franchot Gallo, MD;  [facility];  Service: Urology;  Laterality: Right;  . MASS EXCISION Left 03/04/2015   Procedure: EXCISION OF SOFT TISSUE NEOPLASM LEFT ARM;  Surgeon: Aviva Signs, MD;  Location: [facility];  Service: General;  Laterality: Left;  . PERCUTANEOUS NEPHROSTOLITHOTOMY Bilateral 12/  2011     [facility]  . POLYPECTOMY N/A 03/17/2013   Procedure: GASTRIC POLYPECTOMY;  Surgeon: Danie Binder, MD;  [facility];  Service: Endoscopy;  Laterality: N/A;  . POLYPECTOMY  02/26/2017   Procedure: POLYPECTOMY;  Surgeon: Danie Binder, MD;  [facility];  Service: Endoscopy;;  polyp at cecum, ascending colon polyps x3, hepatic flexure polyps x8, transverse colon polyps x8   . POLYPECTOMY  09/22/2019   Procedure: POLYPECTOMY;  Surgeon: Danie Binder, MD;  [facility];  Service: Endoscopy;;  . REMOVAL RIGHT THIGH CYST  2006  . SAVORY DILATION N/A 03/17/2013   Procedure: SAVORY DILATION;  Surgeon: Danie Binder, MD;  Location[facility];  Service: Endoscopy;  Laterality: N/A;  #12.8, 14, 15, 16 dilators used  . SAVORY DILATION N/A 06/10/2015   Procedure: SAVORY DILATION;  Surgeon: Danie Binder, MD;  [facility];  Service: Endoscopy;  Laterality: N/A;  . TRANSTHORACIC ECHOCARDIOGRAM  01-01-2014   mild LVH/  ef 54-65%/  grade I diastolic dysfunction/  trivial MR, TR, and PR  . VAGINAL HYSTERECTOMY  1970's    Home Medications:  Medications Prior to Admission  Medication Sig Dispense Refill  . acetaminophen (TYLENOL) 500 MG tablet Take 1,000 mg by mouth every 6 (six) hours as needed for moderate pain.     Marland Kitchen amLODipine (NORVASC) 10 MG tablet Take 1 tablet (10 mg total) by mouth daily. 90 tablet 1  . amLODipine (NORVASC) 10 MG tablet Take 10 mg by mouth daily.    . budesonide-formoterol (SYMBICORT) 80-4.5 MCG/ACT inhaler INHALE 2 PUFFS INTO THE LUNGS TWICE A DAY (Patient taking differently: Inhale 2 puffs into the lungs in the morning and at bedtime. ) 30.6 Inhaler 3  . Cholecalciferol (VITAMIN D3) 50 MCG (2000  UT) TABS Take 2,000 Units by mouth daily.    . diclofenac Sodium (VOLTAREN) 1 % GEL Apply 4 g topically 4 (four) times daily. 4 g 5  . diphenhydramine-acetaminophen (TYLENOL PM) 25-500 MG TABS tablet Take 2 tablets by mouth at bedtime.    Marland Kitchen linagliptin (TRADJENTA) 5 MG TABS tablet Take 1 tablet (5 mg total) by mouth daily. 90 tablet 1  . magnesium citrate SOLN Take 0.5 Bottles by mouth every 3 (three) days.    . Melatonin (MELATONIN CHILDRENS) 1 MG CHEW Chew 3 mg by mouth at bedtime.    . pantoprazole (PROTONIX) 40 MG tablet TAKE 1 TABLET (40 MG TOTAL) BY MOUTH 2 (TWO) TIMES DAILY. 180 tablet 1  . PARoxetine (PAXIL) 40 MG tablet Take 1 tablet (40 mg total) by mouth every morning. 90 tablet 1  . rosuvastatin (CRESTOR) 20 MG tablet  Take 1 tablet (20 mg total) by mouth daily. 90 tablet 3  . traMADol (ULTRAM) 50 MG tablet Take 50 mg by mouth every 6 (six) hours as needed for moderate pain.     Marland Kitchen ALPRAZolam (XANAX) 0.5 MG tablet Take by mouth.     Marland Kitchen aspirin EC 81 MG tablet Take 81 mg by mouth daily.     . clopidogrel (PLAVIX) 75 MG tablet Take by mouth.     Mariane Baumgarten Sodium (DSS) 100 MG CAPS Take by mouth.     . furosemide (LASIX) 40 MG tablet TAKE 1 TABLET BY MOUTH DAILY AS NEEDED FOR SWELLING 90 tablet 1  . GAVILYTE-G 236 g solution USE AS DIRECTED.O    . glucose blood (ONETOUCH ULTRA) test strip Use as instructed once daily dx e11.9 100 each 5  . iron polysaccharides (NIFEREX) 150 MG capsule Take by mouth.     Glory Rosebush Delica Lancets 53G MISC Once daily testing dx e11.9 100 each 5  . potassium chloride SA (KLOR-CON) 20 MEQ tablet Take by mouth.       Allergies:  Allergies  Allergen Reactions  . Ace Inhibitors Cough  . Keflex [Cephalexin] Diarrhea and Nausea And Vomiting  . Nitrofurantoin Diarrhea and Nausea And Vomiting  . Penicillins Hives and Other (See Comments)    Reaction:  Blisters on hands/feet  Has patient had a PCN reaction causing immediate rash, facial/tongue/throat swelling, SOB or lightheadedness with hypotension: No Has patient had a PCN reaction causing severe rash involving mucus membranes or skin necrosis: No Has patient had a PCN reaction that required hospitalization No Has patient had a PCN reaction occurring within the last 10 years: No If all of the above answers are "NO", then may proceed with Cephalosporin use.    Family History  Problem Relation Age of Onset  . Hypertension Mother   . Diabetes Mother   . Heart failure Mother   . Dementia Mother   . Emphysema Father   . Hypertension Father   . Diabetes Brother   . GER disease Brother   . Hypertension Sister   . Hypertension Sister   . Cancer Other        family history   . Diabetes Other        family history   . Heart  defect Other        famiily history   . Arthritis Other        family history   . Anesthesia problems Neg Hx   . Hypotension Neg Hx   . Malignant hyperthermia Neg Hx   . Pseudochol deficiency Neg Hx   .  Colon cancer Neg Hx     Social History:  reports that she quit smoking about 31 years ago. Her smoking use included cigarettes. She started smoking about 45 years ago. She has a 20.00 pack-year smoking history. She has never used smokeless tobacco. She reports that she does not drink alcohol and does not use drugs.  ROS: A complete review of systems was performed.  All systems are negative except for pertinent findings as noted.  Physical Exam:  Vital signs in last 24 hours: Temp:  [98.3 F (36.8 C)] 98.3 F (36.8 C) (09/14 0651) Pulse Rate:  [88] 88 (09/14 0700) Resp:  [22-25] 22 (09/14 0700) BP: (121)/(84) 121/84 (09/14 0651) SpO2:  [93 %-95 %] 95 % (09/14 0700) General:  Alert and oriented, No acute distress HEENT: Normocephalic, atraumatic Neck: No JVD or lymphadenopathy Cardiovascular: Regular rate  Lungs: Normal inspiratory/expiratory excursion Abdomen: Soft, nontender, nondistended, no abdominal masses Back: No CVA tenderness Extremities: No edema Neurologic: Grossly intact  Laboratory Data:  Results for orders placed or performed during the hospital encounter of 02/23/20 (from the past 24 hour(s))  Glucose, capillary     Status: Abnormal   Collection Time: 02/23/20  6:43 AM  Result Value Ref Range   Glucose-Capillary 108 (H) 70 - 99 mg/dL   *Note: Due to a large number of results and/or encounters for the requested time period, some results have not been displayed. A complete set of results can be found in Results Review.   Recent Results (from the past 240 hour(s))  SARS CORONAVIRUS 2 (TAT 6-24 HRS) Nasopharyngeal Nasopharyngeal Swab     Status: None   Collection Time: 02/19/20  8:33 AM   Specimen: Nasopharyngeal Swab  Result Value Ref Range Status   SARS  Coronavirus 2 NEGATIVE NEGATIVE Final    Comment: (NOTE) SARS-CoV-2 target nucleic acids are NOT DETECTED.  The SARS-CoV-2 RNA is generally detectable in upper and lower respiratory specimens during the acute phase of infection. Negative results do not preclude SARS-CoV-2 infection, do not rule out co-infections with other pathogens, and should not be used as the sole basis for treatment or other patient management decisions. Negative results must be combined with clinical observations, patient history, and epidemiological information. The expected result is Negative.  Fact Sheet for Patients: SugarRoll.be  Fact Sheet for Healthcare Providers: https://www.woods-mathews.com/  This test is not yet approved or cleared by the [place] FDA and  has been authorized for detection and/or diagnosis of SARS-CoV-2 by FDA under an Emergency Use Authorization (EUA). This EUA will remain  in effect (meaning this test can be used) for the duration of the COVID-19 declaration under Se ction 564(b)(1) of the Act, 21 U.S.C. section 360bbb-3(b)(1), unless the authorization is terminated or revoked sooner.  Performed at [facility], [place] [address] [place], [place] [address]    Creatinine: No results for input(s): CREATININE in the last 168 hours.  Radiologic Imaging: No results found.  Impression/Assessment:  Left renal stone  Plan:  Cystoscopy, left retrograde ureteropyelogram, left ureteroscopy, laser and extraction of left renal stone, double-J stent placement.  I discussed the procedure with the patient as well as expected risks and complications.  She understands these and desires to proceed.  Lillette Boxer Mavrick Mcquigg 02/23/2020, 7:09 AM  Lillette Boxer. Khamani Daniely MD

## 2020-02-23 NOTE — Op Note (Signed)
Preop diagnosis: 10 mm left renal pelvic calculus  Postoperative diagnosis: Same  Principal procedure: Cystoscopy, left retrograde ureteropyelogram, fluoroscopic interpretation, left ureteroscopy (semirigid/flexible), holmium laser lithotripsy, extraction of left renal calculus, placement of 6 French by 26 cm contour double-J stent with tether  Surgeon: Jerelene Salaam  Anesthesia: General with LMA  Complications: None  Drains: Above-mentioned stent  Specimen: Stone fragments Estimated blood loss: Less than 10 mL  Indications: 74 year old female with symptomatic left renal pelvic stone.  She presents at this time for ureteroscopic management of this.  I discussed the procedure, risk, complication and expected outcomes with her.  She understands these and desires to proceed.  Findings: Urothelium of the bladder was normal.  Ureteral orifice ease were normal in configuration location.  Retrograde study of the left ureter/renal pelvis with Omnipaque revealed a normal ureter, filling defect in the left renal pelvis consistent with the above-mentioned stone and normal pyelocalyceal system.  Description of procedure: The patient was properly identified and marked in the holding area.  She was taken to the operating room where general anesthetic was administered with the LMA.  She was placed in the dorsolithotomy position, genitalia and perineum prepped and draped.  Proper timeout performed.  106 French panendoscope was advanced into the bladder, systematic inspection was performed and retrograde study was performed using Omnipaque and a 6 [place] open-ended catheter.  Findings were discussed above.  Following this, I dilated the entire ureter first with the inner core and then the entire 12/14 ureteral access catheter.  This was done without trauma.  The guidewire was left in, and I advanced the semirigid scope up to the area of the renal pelvis.  Unfortunately, I could not identify a stone.  I then remove  the semirigid scope, and replaced the ureteral access catheter.  I then used the flexible ureteroscope to identify the stone which was located in the renal pelvis.  I then inspected the entire pyelocalyceal system.  No other calyceal stones were noted.  The stone was fragmented into multiple smaller fragments using a 200 m fiber and laser energy set at 30 Hz and a power of 1.0 J.  This produced adequate fragmentation.  Fragments were then removed using the nitinol 0 tip basket.  All except for a tiny fragments were removed.  The Tiny ones were unable to be grasped.  Inspection of the pyelocalyceal system at this point revealed no remaining fragments.  I then removed the scope.  Guidewire was replaced, ureteral access catheter was removed, and the guidewire was backloaded through the cystoscope.  I then placed a 26 cm 6 French contour double-J stent with a tether remaining.  Adequate proximal distal curls were seen using fluoroscopy and cystoscopy, respectively after removal of the guidewire.  At this point the bladder was drained.  The guidewire was tied in a knot outside the urethra, trimmed, and tucked inside the vagina.  At this point the procedure was terminated.  The patient was awakened and taken to the PACU in stable condition, having tolerated procedure well.

## 2020-02-23 NOTE — Discharge Instructions (Signed)
1. You may see some blood in the urine and may have some burning with urination for 48-72 hours. You also may notice that you have to urinate more frequently or urgently after your procedure which is normal.  2. You should call should you develop an inability urinate, fever > 101, persistent nausea and vomiting that prevents you from eating or drinking to stay hydrated.  3. If you have a stent, you will likely urinate more frequently and urgently until the stent is removed and you may experience some discomfort/pain in the lower abdomen and flank especially when urinating. You may take pain medication prescribed to you if needed for pain. You may also intermittently have blood in the urine until the stent is removed.  It is okay to remove the stent by pulling on the thread outside your urethra on Friday morning.

## 2020-02-23 NOTE — Anesthesia Postprocedure Evaluation (Signed)
Anesthesia Post Note  Patient: Cheryl Abbott  Procedure(s) Performed: CYSTOSCOPY LEFT  RETROGRADE PYELOGRAM LEFT URETEROSCOPY URETERAL STENT PLACEMENT (Left ) HOLMIUM LASER APPLICATION  Patient location during evaluation: PACU Anesthesia Type: General Level of consciousness: awake and alert and oriented Pain management: pain level controlled Vital Signs Assessment: post-procedure vital signs reviewed and stable Respiratory status: spontaneous breathing and respiratory function stable Cardiovascular status: blood pressure returned to baseline Postop Assessment: no apparent nausea or vomiting Anesthetic complications: no   No complications documented.   Last Vitals:  Vitals:   02/23/20 0946 02/23/20 1001  BP: (!) 122/94   Pulse: 100   Resp: 18   Temp: 36.8 C   SpO2:  93%    Last Pain:  Vitals:   02/23/20 0946  TempSrc: Oral  PainSc: 0-No pain                 Rajamani C Battula

## 2020-02-23 NOTE — Anesthesia Procedure Notes (Signed)
Procedure Name: LMA Insertion Date/Time: 02/23/2020 7:33 AM Performed by: Genelle Bal, CRNA Pre-anesthesia Checklist: Patient identified, Emergency Drugs available, Suction available and Patient being monitored Patient Re-evaluated:Patient Re-evaluated prior to induction Oxygen Delivery Method: Circle system utilized Preoxygenation: Pre-oxygenation with 100% oxygen Induction Type: IV induction Ventilation: Mask ventilation without difficulty LMA: LMA inserted LMA Size: 4.0 Number of attempts: 1 Airway Equipment and Method: Bite block Placement Confirmation: positive ETCO2 Tube secured with: Tape Dental Injury: Teeth and Oropharynx as per pre-operative assessment

## 2020-02-24 ENCOUNTER — Encounter (HOSPITAL_COMMUNITY): Payer: Self-pay | Admitting: Urology

## 2020-03-15 ENCOUNTER — Ambulatory Visit (INDEPENDENT_AMBULATORY_CARE_PROVIDER_SITE_OTHER): Payer: Medicare Other | Admitting: Urology

## 2020-03-15 ENCOUNTER — Other Ambulatory Visit: Payer: Self-pay

## 2020-03-15 ENCOUNTER — Encounter: Payer: Self-pay | Admitting: Urology

## 2020-03-15 VITALS — BP 121/76 | HR 92 | Temp 98.5°F | Ht 72.0 in | Wt 188.0 lb

## 2020-03-15 DIAGNOSIS — N201 Calculus of ureter: Secondary | ICD-10-CM | POA: Diagnosis not present

## 2020-03-15 LAB — URINALYSIS, ROUTINE W REFLEX MICROSCOPIC
Bilirubin, UA: NEGATIVE
Glucose, UA: NEGATIVE
Ketones, UA: NEGATIVE
Nitrite, UA: NEGATIVE
Specific Gravity, UA: 1.015 (ref 1.005–1.030)
Urobilinogen, Ur: 0.2 mg/dL (ref 0.2–1.0)
pH, UA: 7 (ref 5.0–7.5)

## 2020-03-15 LAB — MICROSCOPIC EXAMINATION
Epithelial Cells (non renal): >10 /hpf — AB (ref 0–10)
Renal Epithel, UA: NONE SEEN /hpf

## 2020-03-15 NOTE — Progress Notes (Signed)
H&P  Chief Complaint: Kidney Stones  History of Present Illness: Cheryl Abbott is a 74 y.o. year old female here for F/U of her Kidney Stones.  10.5.2021: Pt reports that following her recent  Lt URS/HLL/stonne extraction, she experienced approximately 3 days of gross hematuria, then soreness following stent removal, but no persistent pain, gross hematuria, or fever. Pt does note an increase in nocturia, but does not think that this symptom is secondary to her lithotripsy.  (below copied from [facility] records):  Cheryl Abbott is a 74 y.o. year old female last seen in 2017 after treatment for ureteral stricture/hydronephrosis on the right.  She has had right greater than left abdominal pain over the past month.  No gross hematuria but she does have nausea with this.  No recent treatment for urinary tract infections.  CT scan was done on July 8 revealing a 10 x 6 mm left upper ureteral calculus.  She is here today for further management.  Past Medical History:  Diagnosis Date  . Abnormal brain scan 02/07/2012  . Anemia   . Anxiety   . Anxiety disorder   . Arthritis   . Brain TIA 08/19/2015  . Cancer ([place])    acute myeloma  . 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 Chronic back pain   . Depression   . Fatty liver   . GERD (gastroesophageal reflux disease)   . Headache   . HEMORRHOIDS 03/15/2009   OCT 2014 HB 12. 9    . History of adenomatous polyp of colon    2008  . History of kidney stones   . Hypercalcemia   . Hypercalcemia 11/30/2010  . Hyperlipidemia   . Hypertension   . Lactose intolerance in adult 02/01/2016  . Left ureteral stone   . Lipoma of arm 01/19/2015  . Lumbar disc disease with radiculopathy   . Nephrolithiasis   . Neuropathic pain of both feet 04/11/2016  . Neuropathy, peripheral   . Nocturnal hypoxia 02/04/2014   Study confirms this done 02/10/2014   . OSA treated with BiPAP    per study 2007  . RENAL CALCULUS 03/15/2009   Qualifier: Diagnosis of  By:  Zeb Comfort    . Sciatica   . Shoulder pain, bilateral   . Sigmoid diverticulosis   . TIA (transient ischemic attack) 02/26/2016  . Type 2 diabetes mellitus ([place])   . Vaginal dryness, menopausal 10/14/2016    Past Surgical History:  Procedure Laterality Date  . BIOPSY N/A 03/17/2013   Procedure: GASTRIC BIOPSIES;  Surgeon: Danie Binder, MD;  Location: [facility];  Service: Endoscopy;  Laterality: N/A;  . BIOPSY  06/10/2015   Procedure: BIOPSY;  Surgeon: Danie Binder, MD;  [facility];  Service: Endoscopy;;  gastric biopsy  . CARDIAC CATHETERIZATION  05-11-2003  dr Shelva Majestic ([facility])   Abnormal cardiolite/   Normal coronary arteries and normal LVF,  ef  63%  . CARDIOVASCULAR STRESS TEST  01-01-2014   normal lexiscan cardiolite/  no ischemia/ infarct/  normal LV function and wall motion ,  ef 81%  . COLONOSCOPY  last one 10/02/2011   MOD Kell TICS, IH, NEXT TCS APR 2018  . COLONOSCOPY WITH PROPOFOL N/A 02/26/2017   Procedure: COLONOSCOPY WITH PROPOFOL;  Surgeon: Danie Binder, MD;  [facility];  Service: Endoscopy;  Laterality: N/A;  11:00am  . COLONOSCOPY WITH PROPOFOL N/A 09/22/2019   Procedure: COLONOSCOPY WITH PROPOFOL;  Surgeon: Danie Binder, MD;  [facility];  Service: Endoscopy;  Laterality: N/A;  1:15  . CYSTO/   RIGHT URETEROSOCOPY LASER LITHOTRIPSY STONE EXTRACTION  10-13-2004  . CYSTO/  RIGHT RETROGRADE PYELOGRAM/  PLACMENT RIGHT URETERAL STENT  01-10-2010  . CYSTOSCOPY W/ URETERAL STENT PLACEMENT Right 08/19/2015   Procedure: CYSTOSCOPY WITH RETROGRADE PYELOGRAM/URETERAL STENT PLACEMENT;  Surgeon: Franchot Gallo, MD;  Location: [facility];  Service: Urology;  Laterality: Right;  . CYSTOSCOPY W/ URETERAL STENT PLACEMENT Left 02/23/2020   Procedure: CYSTOSCOPY LEFT  RETROGRADE PYELOGRAM LEFT URETEROSCOPY URETERAL STENT PLACEMENT;  Surgeon: Franchot Gallo, MD;  Location: [facility];  Service: Urology;  Laterality: Left;  .  CYSTOSCOPY WITH RETROGRADE PYELOGRAM, URETEROSCOPY AND STENT PLACEMENT Left 10/21/2014   Procedure: CYSTOSCOPY WITH RETROGRADE PYELOGRAM, URETEROSCOPY AND STENT PLACEMENT;  Surgeon: Franchot Gallo, MD;  Location[facility];  Service: Urology;  Laterality: Left;  . CYSTOSCOPY WITH RETROGRADE PYELOGRAM, URETEROSCOPY AND STENT PLACEMENT Right 11/22/2015   Procedure: CYSTOSCOPY, RIGHT URETERAL STENT REMOVAL; RIGHT RETROGRADE PYELOGRAM, RIGHT URETEROSCOPY WITH BALLOON DILATION; RIGHT URETERAL STENT PLACEMENT;  Surgeon: Franchot Gallo, MD;  [facility];  Service: Urology;  Laterality: Right;  . CYSTOSCOPY/RETROGRADE/URETEROSCOPY/STONE EXTRACTION WITH BASKET Bilateral 08/02/2015   Procedure: CYSTOSCOPY; BILATERAL RETROGRADE PYELOGRAMS; BILATERAL URETEROSCOPY, STONE EXTRACTION WITH BASKET;  Surgeon: Franchot Gallo, MD;  [facility];  Service: Urology;  Laterality: Bilateral;  . CYSTOSCOPY/RETROGRADE/URETEROSCOPY/STONE EXTRACTION WITH BASKET Right 06/28/2015   Procedure: CYSTOSCOPY/RETROGRADE/URETEROSCOPY/STONE EXTRACTION WITH BASKET, RIGHT URETERAL DOUBLE J STENT PLACEMENT;  Surgeon: Franchot Gallo, MD;  [facility];  Service: Urology;  Laterality: Right;  . ESOPHAGOGASTRODUODENOSCOPY (EGD) WITH PROPOFOL N/A 03/17/2013   Procedure: ESOPHAGOGASTRODUODENOSCOPY (EGD) WITH PROPOFOL;  Surgeon: Danie Binder, MD;  [facility];  Service: Endoscopy;  Laterality: N/A;  . ESOPHAGOGASTRODUODENOSCOPY (EGD) WITH PROPOFOL N/A 06/10/2015   Distal gastritis, distal esophageal stricture s/p dilation  . ESOPHAGOGASTRODUODENOSCOPY (EGD) WITH PROPOFOL N/A 02/26/2017   Procedure: ESOPHAGOGASTRODUODENOSCOPY (EGD) WITH PROPOFOL;  Surgeon: Danie Binder, MD;  [facility];  Service: Endoscopy;  Laterality: N/A;  . FLEXIBLE SIGMOIDOSCOPY  09/11/2011   IRS:[id number] Internal hemorrhoids  . HOLMIUM LASER APPLICATION Left 5/00/9381   Procedure: HOLMIUM LASER APPLICATION;  Surgeon:  Franchot Gallo, MD;  Location[facility];  Service: Urology;  Laterality: Left;  . HOLMIUM LASER APPLICATION Right 02/06/9370   Procedure: HOLMIUM LASER APPLICATION;  Surgeon: Franchot Gallo, MD;  [facility];  Service: Urology;  Laterality: Right;  . HOLMIUM LASER APPLICATION  6/96/7893   Procedure: HOLMIUM LASER APPLICATION;  Surgeon: Franchot Gallo, MD;  [facility];  Service: Urology;;  . MASS EXCISION Left 03/04/2015   Procedure: EXCISION OF SOFT TISSUE NEOPLASM LEFT ARM;  Surgeon: Aviva Signs, MD;  Location: [facility];  Service: General;  Laterality: Left;  . PERCUTANEOUS NEPHROSTOLITHOTOMY Bilateral 12/  2011     [facility]  . POLYPECTOMY N/A 03/17/2013   Procedure: GASTRIC POLYPECTOMY;  Surgeon: Danie Binder, MD;  Location[facility];  Service: Endoscopy;  Laterality: N/A;  . POLYPECTOMY  02/26/2017   Procedure: POLYPECTOMY;  Surgeon: Danie Binder, MD;  [facility];  Service: Endoscopy;;  polyp at cecum, ascending colon polyps x3, hepatic flexure polyps x8, transverse colon polyps x8   . POLYPECTOMY  09/22/2019   Procedure: POLYPECTOMY;  Surgeon: Danie Binder, MD;  [facility];  Service: Endoscopy;;  . REMOVAL RIGHT THIGH CYST  2006  . SAVORY DILATION N/A 03/17/2013   Procedure: SAVORY DILATION;  Surgeon: Marga Melnick  Fields, MD;  Location: [facility];  Service: Endoscopy;  Laterality: N/A;  #12.8, 14, 15, 16 dilators used  . SAVORY DILATION N/A 06/10/2015   Procedure: SAVORY DILATION;  Surgeon: Danie Binder, MD;  [facility];  Service: Endoscopy;  Laterality: N/A;  . TRANSTHORACIC ECHOCARDIOGRAM  01-01-2014   mild LVH/  ef 03-47%/  grade I diastolic dysfunction/  trivial MR, TR, and PR  . VAGINAL HYSTERECTOMY  1970's    Home Medications:  (Not in a hospital admission)   Allergies:  Allergies  Allergen Reactions  . Ace Inhibitors Cough  . Keflex [Cephalexin] Diarrhea and Nausea And Vomiting  . Nitrofurantoin  Diarrhea and Nausea And Vomiting  . Penicillins Hives and Other (See Comments)    Reaction:  Blisters on hands/feet  Has patient had a PCN reaction causing immediate rash, facial/tongue/throat swelling, SOB or lightheadedness with hypotension: No Has patient had a PCN reaction causing severe rash involving mucus membranes or skin necrosis: No Has patient had a PCN reaction that required hospitalization No Has patient had a PCN reaction occurring within the last 10 years: No If all of the above answers are "NO", then may proceed with Cephalosporin use.    Family History  Problem Relation Age of Onset  . Hypertension Mother   . Diabetes Mother   . Heart failure Mother   . Dementia Mother   . Emphysema Father   . Hypertension Father   . Diabetes Brother   . GER disease Brother   . Hypertension Sister   . Hypertension Sister   . Cancer Other        family history   . Diabetes Other        family history   . Heart defect Other        famiily history   . Arthritis Other        family history   . Anesthesia problems Neg Hx   . Hypotension Neg Hx   . Malignant hyperthermia Neg Hx   . Pseudochol deficiency Neg Hx   . Colon cancer Neg Hx     Social History:  reports that she quit smoking about 31 years ago. Her smoking use included cigarettes. She started smoking about 45 years ago. She has a 20.00 pack-year smoking history. She has never used smokeless tobacco. She reports that she does not drink alcohol and does not use drugs.  ROS: A complete review of systems was performed.  All systems are negative except for pertinent findings as noted.  Physical Exam:  Vital signs in last 24 hours: Weight:  [188 lb (85.3 kg)] 188 lb (85.3 kg) (10/05 0832) General:  Alert and oriented, No acute distress HEENT: Normocephalic, atraumatic Neck: No JVD or lymphadenopathy Extremities: No edema Neurologic: Grossly intact  I have reviewed prior pt notes  I have reviewed notes from  referring/previous physicians  I have reviewed urinalysis results  I have independently reviewed prior imaging   Impression/Assessment:  1.She's doing well after Lt URS/stone mgmt  Plan:  1. Pt advised to limit her pm fluid intake if her nocturia becomes bothersome, and to modify her diet to reduce the formation of kidney stones - increase water/citrate, decrease sodium/meat portion size.  2. Urine sent for culture today. Will f/u with abx as needed.  3. F/U in 6 weeks for ultrasound [facility]) and OV for symptom recheck.  CC: Dr. Tula Nakayama  Budd Palmer 03/15/2020, 8:45 AM  Lillette Boxer. Jezlyn Westerfield MD

## 2020-03-15 NOTE — Progress Notes (Signed)
Urological Symptom Review  Patient is experiencing the following symptoms: Get up at night to urinate Weak stream   Review of Systems  Gastrointestinal (upper)  : Negative for upper GI symptoms  Gastrointestinal (lower) : Constipation  Constitutional : Weight loss Fatigue  Skin: Negative for skin symptoms  Eyes: Negative for eye symptoms  Ear/Nose/Throat : Negative for Ear/Nose/Throat symptoms  Hematologic/Lymphatic: Negative for Hematologic/Lymphatic symptoms  Cardiovascular : Leg swelling  Respiratory : Negative for respiratory symptoms  Endocrine: Negative for endocrine symptoms  Musculoskeletal: Back pain  Neurological: Negative for neurological symptoms  Psychologic: Anxiety

## 2020-03-17 LAB — URINE CULTURE

## 2020-03-19 DIAGNOSIS — R0902 Hypoxemia: Secondary | ICD-10-CM | POA: Diagnosis not present

## 2020-03-21 ENCOUNTER — Ambulatory Visit (INDEPENDENT_AMBULATORY_CARE_PROVIDER_SITE_OTHER): Payer: Medicare Other | Admitting: Family Medicine

## 2020-03-21 ENCOUNTER — Encounter: Payer: Self-pay | Admitting: Family Medicine

## 2020-03-21 ENCOUNTER — Telehealth: Payer: Self-pay

## 2020-03-21 ENCOUNTER — Other Ambulatory Visit: Payer: Self-pay

## 2020-03-21 VITALS — BP 120/78 | HR 94 | Temp 98.3°F | Resp 20 | Ht 66.5 in | Wt 185.0 lb

## 2020-03-21 DIAGNOSIS — Z0001 Encounter for general adult medical examination with abnormal findings: Secondary | ICD-10-CM

## 2020-03-21 DIAGNOSIS — Z23 Encounter for immunization: Secondary | ICD-10-CM

## 2020-03-21 DIAGNOSIS — Z0000 Encounter for general adult medical examination without abnormal findings: Secondary | ICD-10-CM

## 2020-03-21 DIAGNOSIS — M5416 Radiculopathy, lumbar region: Secondary | ICD-10-CM | POA: Diagnosis not present

## 2020-03-21 DIAGNOSIS — E669 Obesity, unspecified: Secondary | ICD-10-CM

## 2020-03-21 MED ORDER — METHYLPREDNISOLONE ACETATE 80 MG/ML IJ SUSP
80.0000 mg | Freq: Once | INTRAMUSCULAR | Status: AC
  Administered 2020-03-21: 80 mg via INTRAMUSCULAR

## 2020-03-21 MED ORDER — GABAPENTIN 100 MG PO CAPS: ORAL_CAPSULE | ORAL | 3 refills | Status: DC

## 2020-03-21 NOTE — Patient Instructions (Addendum)
F/U in office with MD early February, call if you need me before  Mammogram to be scheduled at checkout please  Flu vaccine  Today  Nurse please check and document vision screen ( appt changed by me)  Depo Medrol 80 mg iM in office today for back pain and gabapentin 100 mg one to two at bedtime  And tSH level  first week in DEcember  It is important that you exercise regularly at least 30 minutes 5 times a week. If you develop chest pain, have severe difficulty breathing, or feel very tired, stop exercising immediately and seek medical attention  Think about what you will eat, plan ahead. Choose " clean, green, fresh or frozen" over canned, processed or packaged foods which are more sugary, salty and fatty. 70 to 75% of food eaten should be vegetables and fruit. Three meals at set times with snacks allowed between meals, but they must be fruit or vegetables. Aim to eat over a 12 hour period , example 7 am to 7 pm, and STOP after  your last meal of the day. Drink water,generally about 64 ounces per day, no other drink is as healthy. Fruit juice is best enjoyed in a healthy way, by EATING the fruit. Thanks for choosing [facility], we consider it a privelige to serve you.

## 2020-03-21 NOTE — Telephone Encounter (Signed)
Mammogram order is not in the system  Please place the order   PT would like early afternoon appt at [facility]

## 2020-03-21 NOTE — Progress Notes (Signed)
    Cheryl Abbott     MRN: [id number]      DOB: 1945/09/16  HPI: Patient is in for annual physical exam. 1 week h/o uncontrolled low back pain radiating  To left knee, no new incontinence or loss of sensation Recent labs, if available are reviewed. Immunization is reviewed , and  updated    PE: BP 120/78   Pulse 94   Temp 98.3 F (36.8 C)   Resp 20   Ht 5' 6.5" (1.689 m)   Wt 185 lb (83.9 kg)   SpO2 95%   BMI 29.41 kg/m    Pleasant  female, alert and oriented x 3, in no cardio-pulmonary distress. Afebrile. HEENT No facial trauma or asymetry. Sinuses non tender.  Extra occullar muscles intact.. External ears normal, . Neck: supple, no adenopathy,JVD or thyromegaly.No bruits.  Chest: Clear to ascultation bilaterally.No crackles or wheezes. Non tender to palpation  Breast: Not examined, asymptomatic, normal mammogram in 2020   Cardiovascular system; Heart sounds normal,  S1 and  S2 ,no S3.  No murmur, or thrill. Apical beat not displaced Peripheral pulses normal.  Abdomen: Soft, non tender,  No guarding, tenderness or rebound.   .   Musculoskeletal exam: Decreased  ROM of spine,adequate in  hips , shoulders and knees. No deformity ,swelling or crepitus noted. No muscle wasting or atrophy.   Neurologic: Cranial nerves 2 to 12 intact. Power, tone ,sensation normal throughout. No disturbance in gait. No tremor.  Skin: Intact, no ulceration, erythema , scaling or rash noted. Pigmentation normal throughout  Psych; Normal mood and affect. Judgement and concentration normal   Assessment & Plan:  Annual physical exam Annual exam as documented. Counseling done  re healthy lifestyle involving commitment to 150 minutes exercise per week, heart healthy diet, and attaining healthy weight.The importance of adequate sleep also discussed. Regular seat belt use and home safety, is also discussed. Changes in health habits are decided on by the patient with goals  and time frames  set for achieving them. Immunization and cancer screening needs are specifically addressed at this visit.   Lumbar back pain with radiculopathy affecting left lower extremity Uncontrolled, gabapentin to be started and depo medrol 80 mg Im in office

## 2020-03-21 NOTE — Assessment & Plan Note (Signed)

## 2020-03-23 ENCOUNTER — Other Ambulatory Visit (HOSPITAL_COMMUNITY): Payer: Self-pay | Admitting: Family Medicine

## 2020-03-23 ENCOUNTER — Other Ambulatory Visit: Payer: Self-pay | Admitting: *Deleted

## 2020-03-23 DIAGNOSIS — Z1231 Encounter for screening mammogram for malignant neoplasm of breast: Secondary | ICD-10-CM

## 2020-03-23 NOTE — Telephone Encounter (Signed)
Mammo ordered for 12-2 at 10:15 At [facility]

## 2020-03-26 ENCOUNTER — Encounter: Payer: Self-pay | Admitting: Family Medicine

## 2020-03-26 NOTE — Assessment & Plan Note (Signed)
Uncontrolled, gabapentin to be started and depo medrol 80 mg Im in office

## 2020-03-28 ENCOUNTER — Telehealth: Payer: Self-pay

## 2020-03-28 DIAGNOSIS — M25512 Pain in left shoulder: Secondary | ICD-10-CM

## 2020-03-28 DIAGNOSIS — G8929 Other chronic pain: Secondary | ICD-10-CM

## 2020-03-28 NOTE — Telephone Encounter (Signed)
Yes pls do , I will sign

## 2020-03-28 NOTE — Telephone Encounter (Signed)
Pt scheduled for Monday.

## 2020-03-28 NOTE — Telephone Encounter (Signed)
Pt left message on Friday that she needed an appt/referral with Dr Aline Brochure and that she called Thursday and nobody called back.  Wants referral and appt for her left shoulder pain because her left shoulder was hurting very bad. Ok to enter referral to [facility]?

## 2020-03-29 NOTE — Progress Notes (Signed)
Unable to reach patient by phone.   Letter mailed.  

## 2020-04-01 ENCOUNTER — Other Ambulatory Visit: Payer: Self-pay

## 2020-04-01 ENCOUNTER — Ambulatory Visit (INDEPENDENT_AMBULATORY_CARE_PROVIDER_SITE_OTHER): Payer: Medicare Other

## 2020-04-01 VITALS — BP 105/73 | HR 94 | Temp 98.3°F | Resp 20 | Ht 66.5 in | Wt 185.0 lb

## 2020-04-01 DIAGNOSIS — Z0000 Encounter for general adult medical examination without abnormal findings: Secondary | ICD-10-CM

## 2020-04-01 NOTE — Progress Notes (Addendum)
Subjective:   Cheryl Abbott is a 73 y.o. female who presents for Medicare Annual (Subsequent) preventive examination.       Objective:    There were no vitals filed for this visit. There is no height or weight on file to calculate BMI.  Advanced Directives 02/23/2020 02/19/2020 02/19/2020 02/12/2020 02/08/2020 02/08/2020 09/21/2019  Does Patient Have a Medical Advance Directive? Yes No No Yes Yes No Yes  Type of Advance Directive Healthcare Power of [place] - -  Does patient want to make changes to medical advance directive? No - Patient declined No - Patient declined Yes (MAU/Ambulatory/Procedural Areas - Information given) No - Patient declined No - Patient declined - No - Patient declined  Copy of [place] in Chart? No - copy requested No - copy requested - No - copy requested No - copy requested - -  Would patient like information on creating a medical advance directive? - No - Patient declined - No - Patient declined No - Patient declined No - Patient declined -  Pre-existing out of facility DNR order (yellow form or pink MOST form) - - - - - - -    Current Medications (verified) Outpatient Encounter Medications as of 04/01/2020  Medication Sig  . acetaminophen (TYLENOL) 500 MG tablet Take 1,000 mg by mouth every 6 (six) hours as needed for moderate pain.   Marland Kitchen amLODipine (NORVASC) 10 MG tablet Take 10 mg by mouth daily.  Marland Kitchen aspirin EC 81 MG tablet Take 81 mg by mouth daily.   . budesonide-formoterol (SYMBICORT) 80-4.5 MCG/ACT inhaler INHALE 2 PUFFS INTO THE LUNGS TWICE A DAY (Patient taking differently: Inhale 2 puffs into the lungs in the morning and at bedtime. )  . Cholecalciferol (VITAMIN D3) 50 MCG (2000 UT) TABS Take 2,000 Units by mouth daily.  . diclofenac Sodium (VOLTAREN) 1 % GEL Apply 4 g topically 4 (four) times daily.  . diphenhydramine-acetaminophen (TYLENOL PM)  25-500 MG TABS tablet Take 2 tablets by mouth at bedtime.  Mariane Baumgarten Sodium (DSS) 100 MG CAPS Take by mouth.   . gabapentin (NEURONTIN) 100 MG capsule Take one to two capsules art bedtime for nerve pain  . GAVILYTE-G 236 g solution USE AS DIRECTED.O  . glucose blood (ONETOUCH ULTRA) test strip Use as instructed once daily dx e11.9  . iron polysaccharides (NIFEREX) 150 MG capsule Take by mouth.   . linagliptin (TRADJENTA) 5 MG TABS tablet Take 1 tablet (5 mg total) by mouth daily.  . magnesium citrate SOLN Take 0.5 Bottles by mouth every 3 (three) days.  Glory Rosebush Delica Lancets 40J MISC Once daily testing dx e11.9  . pantoprazole (PROTONIX) 40 MG tablet TAKE 1 TABLET (40 MG TOTAL) BY MOUTH 2 (TWO) TIMES DAILY.  Marland Kitchen PARoxetine (PAXIL) 40 MG tablet Take 1 tablet (40 mg total) by mouth every morning.  . rosuvastatin (CRESTOR) 20 MG tablet Take 1 tablet (20 mg total) by mouth daily.  . traMADol (ULTRAM) 50 MG tablet Take 50 mg by mouth every 6 (six) hours as needed for moderate pain.    No facility-administered encounter medications on file as of 04/01/2020.    Allergies (verified) Ace inhibitors, Keflex [cephalexin], Nitrofurantoin, and Penicillins   History: Past Medical History:  Diagnosis Date  . Abnormal brain scan 02/07/2012  . Anemia   . Anxiety   . Anxiety disorder   . Arthritis   . Brain  TIA 08/19/2015  . Cancer ([place])    acute myeloma  . 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 Chronic back pain   . Depression   . Fatty liver   . GERD (gastroesophageal reflux disease)   . Headache   . HEMORRHOIDS 03/15/2009   OCT 2014 HB 12. 9    . History of adenomatous polyp of colon    2008  . History of kidney stones   . Hypercalcemia   . Hypercalcemia 11/30/2010  . Hyperlipidemia   . Hypertension   . Lactose intolerance in adult 02/01/2016  . Left ureteral stone   . Lipoma of arm 01/19/2015  . Lumbar disc disease with radiculopathy   . Nephrolithiasis   . Neuropathic pain of  both feet 04/11/2016  . Neuropathy, peripheral   . Nocturnal hypoxia 02/04/2014   Study confirms this done 02/10/2014   . OSA treated with BiPAP    per study 2007  . RENAL CALCULUS 03/15/2009   Qualifier: Diagnosis of  By: Zeb Comfort    . Sciatica   . Shoulder pain, bilateral   . Sigmoid diverticulosis   . TIA (transient ischemic attack) 02/26/2016  . Type 2 diabetes mellitus ([place])   . Vaginal dryness, menopausal 10/14/2016   Past Surgical History:  Procedure Laterality Date  . BIOPSY N/A 03/17/2013   Procedure: GASTRIC BIOPSIES;  Surgeon: Danie Binder, MD;  Location: [facility];  Service: Endoscopy;  Laterality: N/A;  . BIOPSY  06/10/2015   Procedure: BIOPSY;  Surgeon: Danie Binder, MD;  [facility];  Service: Endoscopy;;  gastric biopsy  . CARDIAC CATHETERIZATION  05-11-2003  dr Shelva Majestic ([facility])   Abnormal cardiolite/   Normal coronary arteries and normal LVF,  ef  63%  . CARDIOVASCULAR STRESS TEST  01-01-2014   normal lexiscan cardiolite/  no ischemia/ infarct/  normal LV function and wall motion ,  ef 81%  . COLONOSCOPY  last one 10/02/2011   [facility] [place] TICS, IH, NEXT TCS APR 2018  . COLONOSCOPY WITH PROPOFOL N/A 02/26/2017   Procedure: COLONOSCOPY WITH PROPOFOL;  Surgeon: Danie Binder, MD;  [facility];  Service: Endoscopy;  Laterality: N/A;  11:00am  . COLONOSCOPY WITH PROPOFOL N/A 09/22/2019   Procedure: COLONOSCOPY WITH PROPOFOL;  Surgeon: Danie Binder, MD;  [facility];  Service: Endoscopy;  Laterality: N/A;  1:15  . CYSTO/   RIGHT URETEROSOCOPY LASER LITHOTRIPSY STONE EXTRACTION  10-13-2004  . CYSTO/  RIGHT RETROGRADE PYELOGRAM/  PLACMENT RIGHT URETERAL STENT  01-10-2010  . CYSTOSCOPY W/ URETERAL STENT PLACEMENT Right 08/19/2015   Procedure: CYSTOSCOPY WITH RETROGRADE PYELOGRAM/URETERAL STENT PLACEMENT;  Surgeon: Franchot Gallo, MD;  [facility];  Service: Urology;  Laterality: Right;  . CYSTOSCOPY W/ URETERAL  STENT PLACEMENT Left 02/23/2020   Procedure: CYSTOSCOPY LEFT  RETROGRADE PYELOGRAM LEFT URETEROSCOPY URETERAL STENT PLACEMENT;  Surgeon: Franchot Gallo, MD;  [facility];  Service: Urology;  Laterality: Left;  . CYSTOSCOPY WITH RETROGRADE PYELOGRAM, URETEROSCOPY AND STENT PLACEMENT Left 10/21/2014   Procedure: CYSTOSCOPY WITH RETROGRADE PYELOGRAM, URETEROSCOPY AND STENT PLACEMENT;  Surgeon: Franchot Gallo, MD;  Location: [facility];  Service: Urology;  Laterality: Left;  . CYSTOSCOPY WITH RETROGRADE PYELOGRAM, URETEROSCOPY AND STENT PLACEMENT Right 11/22/2015   Procedure: CYSTOSCOPY, RIGHT URETERAL STENT REMOVAL; RIGHT RETROGRADE PYELOGRAM, RIGHT URETEROSCOPY WITH BALLOON DILATION; RIGHT URETERAL STENT PLACEMENT;  Surgeon: Franchot Gallo, MD;  [facility]: [facility];  Service: Urology;  Laterality: Right;  . CYSTOSCOPY/RETROGRADE/URETEROSCOPY/STONE EXTRACTION  WITH BASKET Bilateral 08/02/2015   Procedure: CYSTOSCOPY; BILATERAL RETROGRADE PYELOGRAMS; BILATERAL URETEROSCOPY, STONE EXTRACTION WITH BASKET;  Surgeon: Franchot Gallo, MD;  [facility];  Service: Urology;  Laterality: Bilateral;  . CYSTOSCOPY/RETROGRADE/URETEROSCOPY/STONE EXTRACTION WITH BASKET Right 06/28/2015   Procedure: CYSTOSCOPY/RETROGRADE/URETEROSCOPY/STONE EXTRACTION WITH BASKET, RIGHT URETERAL DOUBLE J STENT PLACEMENT;  Surgeon: Franchot Gallo, MD;  [facility];  Service: Urology;  Laterality: Right;  . ESOPHAGOGASTRODUODENOSCOPY (EGD) WITH PROPOFOL N/A 03/17/2013   Procedure: ESOPHAGOGASTRODUODENOSCOPY (EGD) WITH PROPOFOL;  Surgeon: Danie Binder, MD;  [facility];  Service: Endoscopy;  Laterality: N/A;  . ESOPHAGOGASTRODUODENOSCOPY (EGD) WITH PROPOFOL N/A 06/10/2015   Distal gastritis, distal esophageal stricture s/p dilation  . ESOPHAGOGASTRODUODENOSCOPY (EGD) WITH PROPOFOL N/A 02/26/2017   Procedure: ESOPHAGOGASTRODUODENOSCOPY (EGD) WITH PROPOFOL;  Surgeon: Danie Binder, MD;  [facility];  Service: Endoscopy;  Laterality: N/A;  . FLEXIBLE SIGMOIDOSCOPY  09/11/2011   ZOX:[id number] Internal hemorrhoids  . HOLMIUM LASER APPLICATION Left 9/81/1914   Procedure: HOLMIUM LASER APPLICATION;  Surgeon: Franchot Gallo, MD;  Location: [facility];  Service: Urology;  Laterality: Left;  . HOLMIUM LASER APPLICATION Right 7/82/9562   Procedure: HOLMIUM LASER APPLICATION;  Surgeon: Franchot Gallo, MD;  [facility];  Service: Urology;  Laterality: Right;  . HOLMIUM LASER APPLICATION  07/11/8655   Procedure: HOLMIUM LASER APPLICATION;  Surgeon: Franchot Gallo, MD;  [facility];  Service: Urology;;  . MASS EXCISION Left 03/04/2015   Procedure: EXCISION OF SOFT TISSUE NEOPLASM LEFT ARM;  Surgeon: Aviva Signs, MD;  Location: [facility];  Service: General;  Laterality: Left;  . PERCUTANEOUS NEPHROSTOLITHOTOMY Bilateral 12/  2011     [facility]  . POLYPECTOMY N/A 03/17/2013   Procedure: GASTRIC POLYPECTOMY;  Surgeon: Danie Binder, MD;  [facility];  Service: Endoscopy;  Laterality: N/A;  . POLYPECTOMY  02/26/2017   Procedure: POLYPECTOMY;  Surgeon: Danie Binder, MD;  [facility];  Service: Endoscopy;;  polyp at cecum, ascending colon polyps x3, hepatic flexure polyps x8, transverse colon polyps x8   . POLYPECTOMY  09/22/2019   Procedure: POLYPECTOMY;  Surgeon: Danie Binder, MD;  [facility];  Service: Endoscopy;;  . REMOVAL RIGHT THIGH CYST  2006  . SAVORY DILATION N/A 03/17/2013   Procedure: SAVORY DILATION;  Surgeon: Danie Binder, MD;  [facility];  Service: Endoscopy;  Laterality: N/A;  #12.8, 14, 15, 16 dilators used  . SAVORY DILATION N/A 06/10/2015   Procedure: SAVORY DILATION;  Surgeon: Danie Binder, MD;  [facility];  Service: Endoscopy;  Laterality: N/A;  . TRANSTHORACIC ECHOCARDIOGRAM  01-01-2014   mild LVH/  ef 84-69%/  grade I diastolic dysfunction/  trivial MR, TR, and PR  . VAGINAL HYSTERECTOMY   1970's   Family History  Problem Relation Age of Onset  . Hypertension Mother   . Diabetes Mother   . Heart failure Mother   . Dementia Mother   . Emphysema Father   . Hypertension Father   . Diabetes Brother   . GER disease Brother   . Hypertension Sister   . Hypertension Sister   . Cancer Other        family history   . Diabetes Other        family history   . Heart defect Other        famiily history   . Arthritis Other        family history   . Anesthesia problems  Neg Hx   . Hypotension Neg Hx   . Malignant hyperthermia Neg Hx   . Pseudochol deficiency Neg Hx   . Colon cancer Neg Hx    Social History   Socioeconomic History  . Marital status: Divorced    Spouse name: Not on file  . Number of children: 2  . Years of education: Not on file  . Highest education level: Not on file  Occupational History  . Occupation: retired   Tobacco Use  . Smoking status: Former Smoker    Packs/day: 1.00    Years: 20.00    Pack years: 20.00    Types: Cigarettes    Start date: 09/21/1974    Quit date: 09/20/1988    Years since quitting: 31.5  . Smokeless tobacco: Never Used  . Tobacco comment: Quit x 20 years  Vaping Use  . Vaping Use: Never used  Substance and Sexual Activity  . Alcohol use: No    Alcohol/week: 0.0 standard drinks  . Drug use: No  . Sexual activity: Never    Birth control/protection: Surgical  Other Topics Concern  . Not on file  Social History Narrative   Patient is right handed   Patient drinks some caffeine daily.   Social Determinants of Health   Financial Resource Strain:   . Difficulty of Paying Living Expenses: Not on file  Food Insecurity:   . Worried About Charity fundraiser in the Last Year: Not on file  . Ran Out of Food in the Last Year: Not on file  Transportation Needs:   . Lack of Transportation (Medical): Not on file  . Lack of Transportation (Non-Medical): Not on file  Physical Activity:   . Days of Exercise per Week: Not on  file  . Minutes of Exercise per Session: Not on file  Stress:   . Feeling of Stress : Not on file  Social Connections:   . Frequency of Communication with Friends and Family: Not on file  . Frequency of Social Gatherings with Friends and Family: Not on file  . Attends Religious Services: Not on file  . Active Member of Clubs or Organizations: Not on file  . Attends Archivist Meetings: Not on file  . Marital Status: Not on file    Tobacco Counseling Counseling given: Not Answered Comment: Quit x 20 years                 Diabetic? Yes         Activities of Daily Living In your present state of health, do you have any difficulty performing the following activities: 02/19/2020 09/21/2019  Hearing? N N  Vision? N N  Difficulty concentrating or making decisions? N N  Walking or climbing stairs? N N  Dressing or bathing? N N  Doing errands, shopping? - N  Some recent data might be hidden    Patient Care Team: [name], MD as PCP - General [facility], Alphonse Guild, MD as PCP - Cardiology (Cardiology) Danie Binder, MD (Inactive) (Gastroenterology) Irine Seal, MD as Attending Physician (Urology) Kathrynn Ducking, MD (Neurology) Franchot Gallo, MD as Consulting Physician (Urology) Rutherford Guys, MD as Consulting Physician (Ophthalmology)  Indicate any recent Medical Services you may have received from other than [facility] providers in the past year (date may be approximate).     Assessment:   This is a routine wellness examination for [place].  Hearing/Vision screen No exam data present  Dietary issues and exercise activities discussed:  Goals    . Blood Pressure < 140/90    . HEMOGLOBIN A1C < 7.0    . LDL CALC < 100      Depression Screen PHQ 2/9 Scores 03/21/2020 12/07/2019 11/16/2019 08/14/2019 07/13/2019 07/01/2019 04/01/2019  PHQ - 2 Score 0 0 0 0 0 0 1  PHQ- 9 Score - - 7 - 6 - 13    Fall Risk Fall Risk  03/21/2020 01/07/2020  12/07/2019 11/16/2019 08/14/2019  Falls in the past year? 0 1 1 0 1  Number falls in past yr: 0 0 0 0 1  Injury with Fall? 0 0 0 0 1  Comment - - - - -  Risk Factor Category  - - - - -  Risk for fall due to : - - - No Fall Risks -  Follow up Falls evaluation completed - - Falls evaluation completed -    Any stairs in or around the home? No  If so, are there any without handrails? No  Home free of loose throw rugs in walkways, pet beds, electrical cords, etc? Yes  Adequate lighting in your home to reduce risk of falls? Yes   ASSISTIVE DEVICES UTILIZED TO PREVENT FALLS:  Life alert? No  Use of a cane, walker or w/c? Yes  Grab bars in the bathroom? No  Shower chair or bench in shower? No  Elevated toilet seat or a handicapped toilet? No   TIMED UP AND GO:  Was the test performed? No .      Cognitive Function:     6CIT Screen 04/01/2019 03/24/2018 03/20/2017  What Year? 0 points 0 points 0 points  What month? 0 points 0 points 0 points  What time? 0 points 0 points 0 points  Count back from 20 0 points 0 points 0 points  Months in reverse 0 points 0 points 0 points  Repeat phrase 0 points 2 points 0 points  Total Score 0 2 0    Immunizations Immunization History  Administered Date(s) Administered  . Fluad Quad(high Dose 65+) 03/21/2020  . Influenza Split 03/17/2015  . Influenza Whole 04/15/2007, 03/17/2009  . Influenza,inj,Quad PF,6+ Mos 02/04/2014, 02/12/2017, 02/17/2018  . Influenza-Unspecified 02/02/2019  . Moderna SARS-COVID-2 Vaccination 07/22/2019, 08/19/2019  . Pneumococcal Conjugate-13 12/21/2013  . Pneumococcal Polysaccharide-23 01/19/2004, 05/23/2010, 02/02/2019  . Td 06/07/2003  . Tdap 06/14/2011  . Zoster 02/02/2008    Tdap: Up to date.  Flu Vaccine status: Up to date Pneumococcal vaccine status: Up to date Covid-19 vaccine status: Completed vaccines  Qualifies for Shingles Vaccine? Yes   Zostavax completed Yes   Shingrix Completed?:  Yes  Screening Tests Health Maintenance  Topic Date Due  . HEMOGLOBIN A1C  05/19/2020  . URINE MICROALBUMIN  11/19/2020  . OPHTHALMOLOGY EXAM  12/27/2020  . FOOT EXAM  01/10/2021  . TETANUS/TDAP  06/13/2021  . COLONOSCOPY  09/21/2021  . INFLUENZA VACCINE  Completed  . DEXA SCAN  Completed  . COVID-19 Vaccine  Completed  . Hepatitis C Screening  Completed  . PNA vac Low Risk Adult  Completed    Health Maintenance  There are no preventive care reminders to display for this patient.  Colorectal cancer screening: Completed normal. Repeat every 0  years Mammogram: Due and referral sent.  Bone Density status: Completed normal. Results reflect: Bone density results: NORMAL. Repeat every 5 years.  Lung Cancer Screening: (Low Dose CT Chest recommended if Age 3-80 years, 30 pack-year currently smoking OR have quit w/in 15years.) does not  qualify.    Additional Screening:  Hepatitis C Screening: does qualify; Completed   Vision Screening: Recommended annual ophthalmology exams for early detection of glaucoma and other disorders of the eye.  Is the patient up to date with their annual eye exam?  Yes    Who is the provider or what is the name of the office in which the patient attends annual eye exams? Had annual eye exam here at [facility]   If pt is not established with a provider, would they like to be referred to a provider to establish care? No .   Dental Screening: Recommended annual dental exams for proper oral hygiene  Community Resource Referral / Chronic Care Management: CRR required this visit?  No   CCM required this visit?  No      Plan:     I have personally reviewed and noted the following in the patient's chart:   . Medical and social history . Use of alcohol, tobacco or illicit drugs  . Current medications and supplements . Functional ability and status . Nutritional status . Physical activity . Advanced directives . List of other  physicians . Hospitalizations, surgeries, and ER visits in previous 12 months . Vitals . Screenings to include cognitive, depression, and falls . Referrals and appointments  In addition, I have reviewed and discussed with patient certain preventive protocols, quality metrics, and best practice recommendations. A written personalized care plan for preventive services as well as general preventive health recommendations were provided to patient.     Lonn Georgia, LPN   [id number]   Nurse Notes: AWV conducted over the phone with pt in the home.

## 2020-04-04 ENCOUNTER — Ambulatory Visit (INDEPENDENT_AMBULATORY_CARE_PROVIDER_SITE_OTHER): Payer: Medicare Other | Admitting: Orthopedic Surgery

## 2020-04-04 ENCOUNTER — Ambulatory Visit: Payer: Medicare Other

## 2020-04-04 ENCOUNTER — Encounter: Payer: Self-pay | Admitting: Orthopedic Surgery

## 2020-04-04 ENCOUNTER — Other Ambulatory Visit: Payer: Self-pay

## 2020-04-04 VITALS — BP 119/82 | HR 92 | Ht 66.5 in | Wt 185.0 lb

## 2020-04-04 DIAGNOSIS — G8929 Other chronic pain: Secondary | ICD-10-CM

## 2020-04-04 DIAGNOSIS — M25512 Pain in left shoulder: Secondary | ICD-10-CM

## 2020-04-04 DIAGNOSIS — M47812 Spondylosis without myelopathy or radiculopathy, cervical region: Secondary | ICD-10-CM

## 2020-04-04 IMAGING — CT CT ABD-PELV W/O CM
2 of 3 series · 16 of 46 positions shown, 18 images · non-contrast
Comparison: CT abdomen dated 08/19/2015.

CLINICAL DATA: Pt states abd bloating with nausea when eating for
one week Pt. States recently diagnosed with blood cancer. Pt.
declines IV. Per earlier bone marrow biopsy report, history of
monoclonal paraproteinemia.

EXAM:
CT ABDOMEN AND PELVIS WITHOUT CONTRAST
TECHNIQUE: Multidetector CT imaging of the abdomen and pelvis was performed
following the standard protocol without IV contrast.

[Series 2: axial st · axial · 0.79mm/px · z∈[+1228,+1633]mm · 13 of 93 slices shown, 15 images]
[im 6/93  soft-tissue]
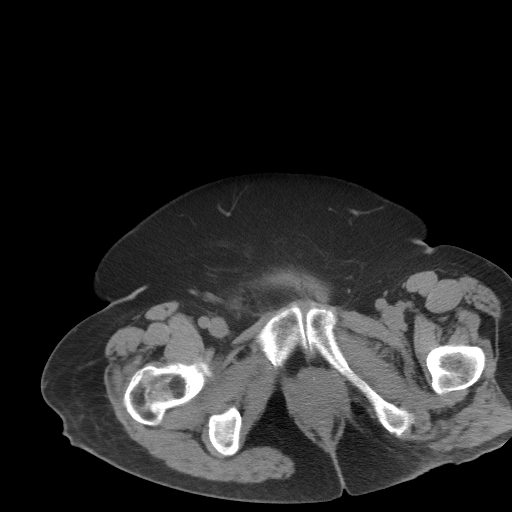
[im 6/93  bone]
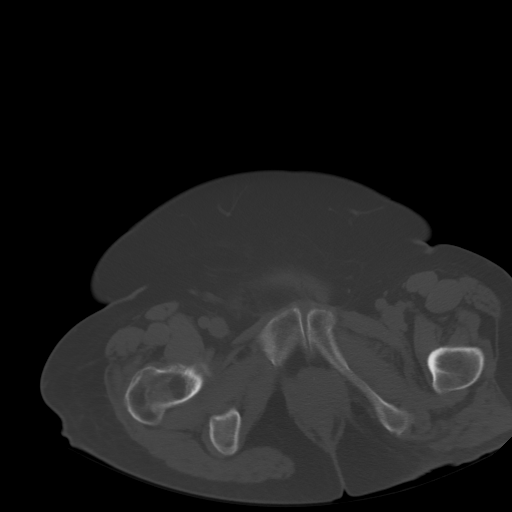
[im 12/93  soft-tissue]
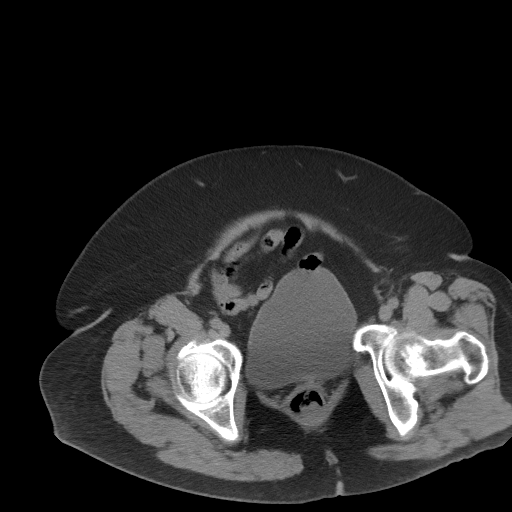
[im 18/93  soft-tissue]
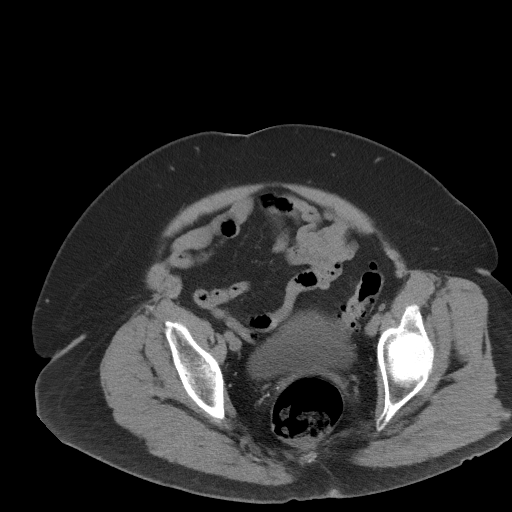
[im 27/93  soft-tissue]
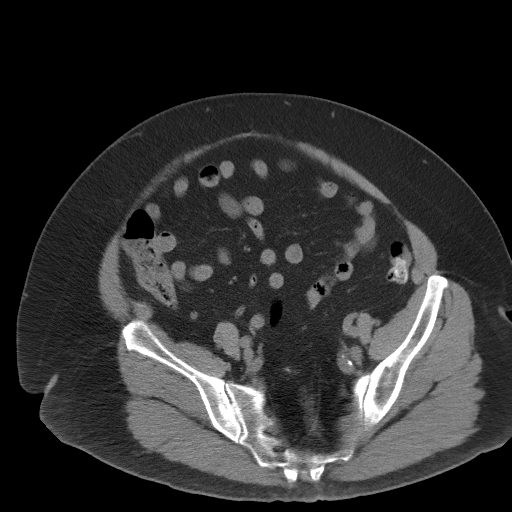
[im 33/93  soft-tissue]
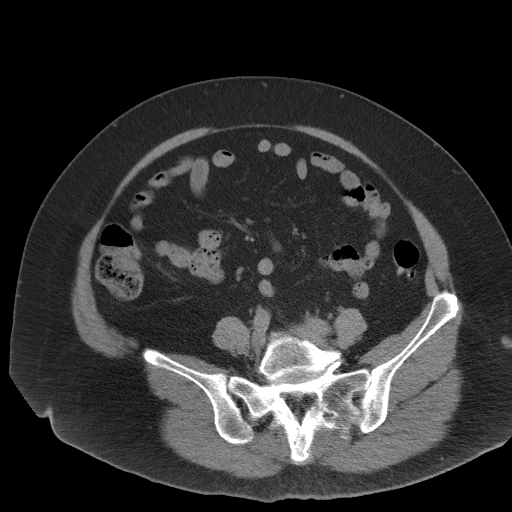
[im 39/93  soft-tissue]
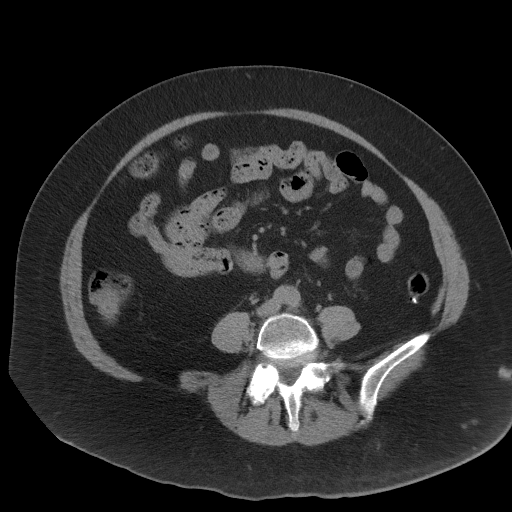
[im 48/93  soft-tissue]
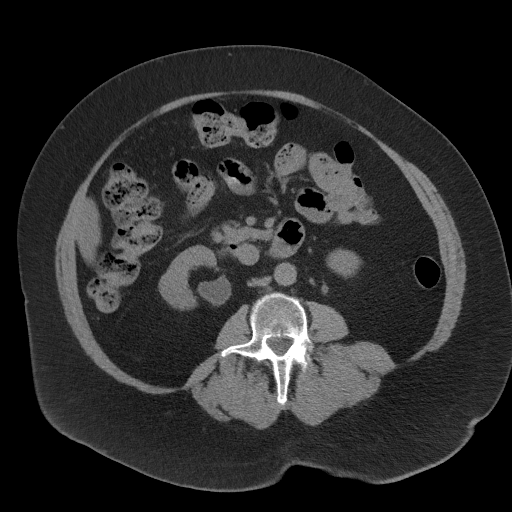
[im 54/93  soft-tissue]
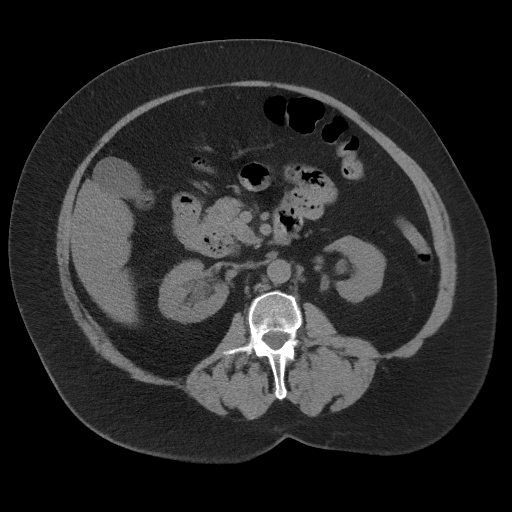
[im 60/93  soft-tissue]
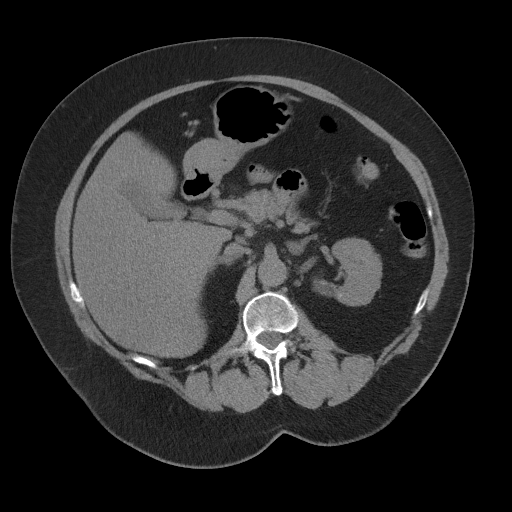
[im 60/93  bone]
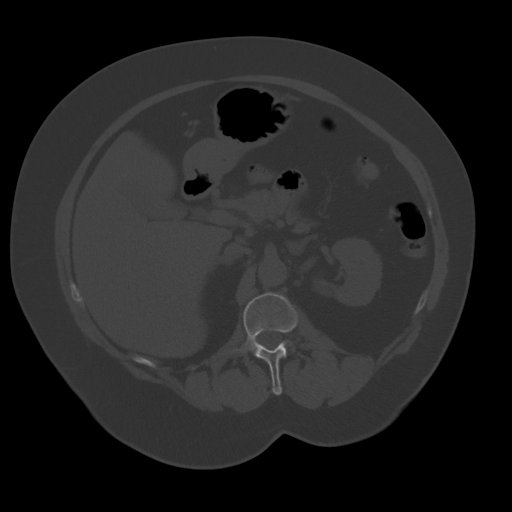
[im 66/93  soft-tissue]
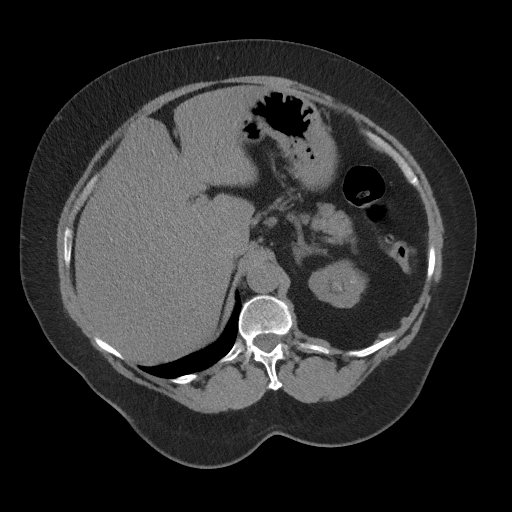
[im 75/93  soft-tissue]
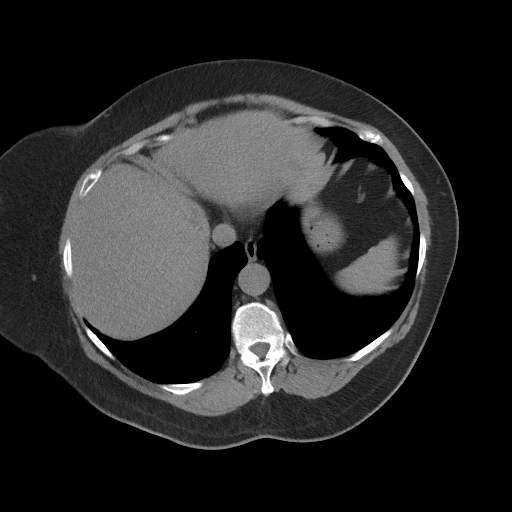
[im 81/93  soft-tissue]
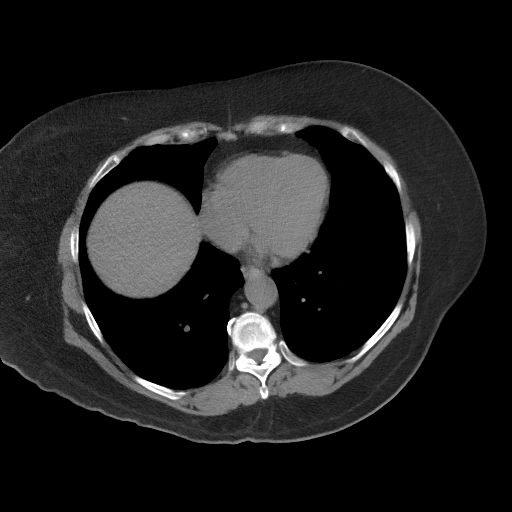
[im 87/93  soft-tissue]
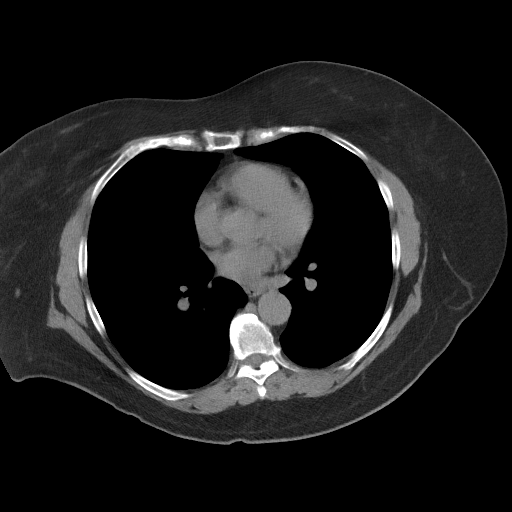

[Series 5: coronal st · coronal · 0.90mm/px · 3 of 114 slices shown]
[im 38/114  soft-tissue]
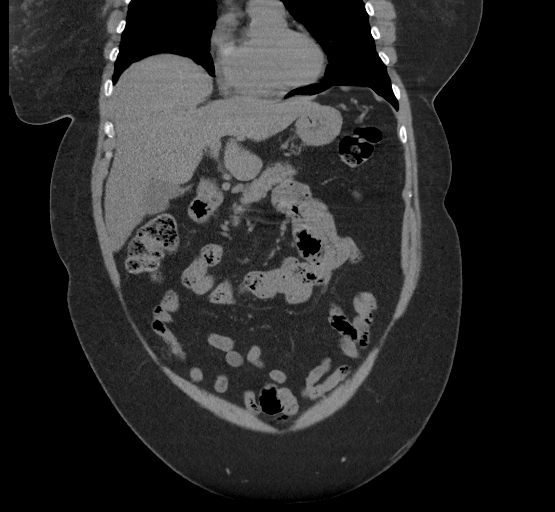
[im 51/114  soft-tissue]
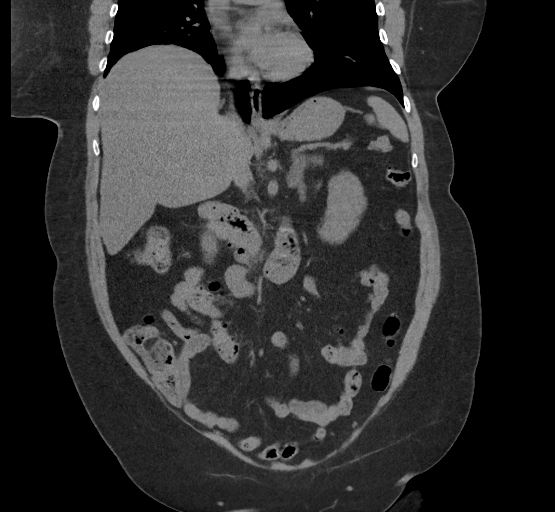
[im 63/114  soft-tissue]
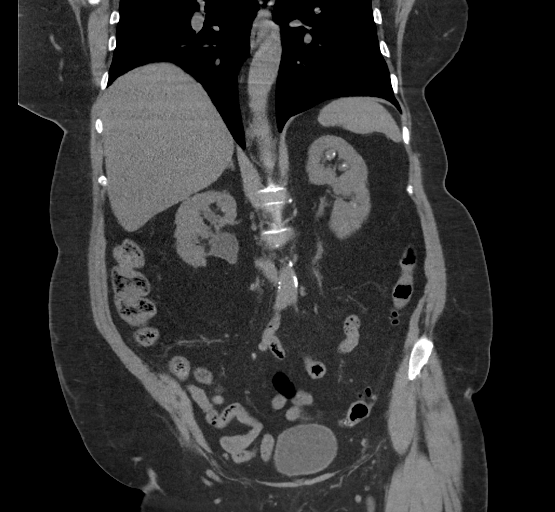

[16 of 46 positions shown; findings below may reference images not displayed]

FINDINGS: Lower chest: No acute abnormality. 13 mm mass within the lower
portion of the anterior mediastinum, incompletely imaged,
possibilities including pericardial cyst or lymph node based on CT
density measurements, but stable for greater than 2 years indicating
benignity.

Hepatobiliary: No focal liver abnormality is seen. No gallstones,
gallbladder wall thickening, or biliary dilatation.

Pancreas: Unremarkable. No pancreatic ductal dilatation or
surrounding inflammatory changes.

Spleen: Normal in size without focal abnormality.

Adrenals/Urinary Tract: Adrenal glands appear normal. Numerous LEFT
renal calculi, largest measuring 5 mm, majority 1-2 mm in size.
RIGHT kidney is unremarkable without stone or hydronephrosis.

1 mm stone within the proximal LEFT ureter without significant
hydronephrosis. No RIGHT-sided ureteral stone or bladder stone.
Bladder is unremarkable, partially decompressed.

Stomach/Bowel: Bowel is normal in caliber. Scattered diverticulosis
within the descending and sigmoid colon without evidence of acute
diverticulitis. Appendix is normal. Stomach is unremarkable.

Vascular/Lymphatic: Aortic atherosclerosis. No enlarged abdominal or
pelvic lymph nodes.

Reproductive: Presumed hysterectomy.  No adnexal mass or free fluid.

Other: No free fluid or abscess collection. No free intraperitoneal
air.

Musculoskeletal: No acute or suspicious osseous abnormality.
IMPRESSION: 1. 1 mm stone within the proximal LEFT ureter, without associated
hydronephrosis.
2. Numerous LEFT renal stones, largest measuring 5 mm, majority
measuring 1-2 mm in size.
3. Colonic diverticulosis without evidence of acute diverticulitis.
4. Aortic atherosclerosis.

## 2020-04-04 MED ORDER — METHOCARBAMOL 500 MG PO TABS: 500.0000 mg | ORAL_TABLET | Freq: Three times a day (TID) | ORAL | 1 refills | Status: DC

## 2020-04-04 NOTE — Patient Instructions (Signed)
On the days it hurts  Heat or heat and ice   Physical therapy   Take robaxin on the days its hurting   Still take your tramadol and gabapentin

## 2020-04-04 NOTE — Progress Notes (Signed)
Chief Complaint  Patient presents with  . Shoulder Pain    left shoulder pain comes and goes.     Cheryl Abbott was seen today for shoulder pain.  Diagnoses and all orders for this visit:  Spondylosis without myelopathy or radiculopathy, cervical region -     methocarbamol (ROBAXIN) 500 MG tablet; Take 1 tablet (500 mg total) by mouth 3 (three) times daily.  Chronic left shoulder pain -     DG Shoulder Left; Future -     Ambulatory referral to Occupational Therapy    74 year old female with chronic neck pain and cervical spondylosis previously seen by neurosurgery but symptoms did not warrant surgery at that time.  Seen by Dr. Vertell Limber.  Recommend physical therapy and add Robaxin to her medication regimen  History of present illness 74 year old female previously worked up for cervical spondylosis seen by neurosurgery and at the time felt that symptoms did not warrant surgical intervention based on patient's age and medical status patient presents back with increasing pain in her trapezius neck and shoulder blade with some radiation of the pain to the shoulder  No history of recent trauma  Denies numbness or tingling in the upper extremities no weakness in the upper extremities  Her pain is 10 out of 10 when it comes but it only comes once every 2 to 3 weeks  Past Medical History:  Diagnosis Date  . Abnormal brain scan 02/07/2012  . Anemia   . Anxiety   . Anxiety disorder   . Arthritis   . Brain TIA 08/19/2015  . Cancer ([place])    acute myeloma  . 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 Chronic back pain   . Depression   . Fatty liver   . GERD (gastroesophageal reflux disease)   . Headache   . HEMORRHOIDS 03/15/2009   OCT 2014 HB 12. 9    . History of adenomatous polyp of colon    2008  . History of kidney stones   . Hypercalcemia   . Hypercalcemia 11/30/2010  . Hyperlipidemia   . Hypertension   . Lactose intolerance in adult 02/01/2016  . Left ureteral stone   . Lipoma  of arm 01/19/2015  . Lumbar disc disease with radiculopathy   . Nephrolithiasis   . Neuropathic pain of both feet 04/11/2016  . Neuropathy, peripheral   . Nocturnal hypoxia 02/04/2014   Study confirms this done 02/10/2014   . OSA treated with BiPAP    per study 2007  . RENAL CALCULUS 03/15/2009   Qualifier: Diagnosis of  By: Zeb Comfort    . Sciatica   . Shoulder pain, bilateral   . Sigmoid diverticulosis   . TIA (transient ischemic attack) 02/26/2016  . Type 2 diabetes mellitus ([place])   . Vaginal dryness, menopausal 10/14/2016   BP 119/82   Pulse 92   Ht 5' 6.5" (1.689 m)   Wt 185 lb (83.9 kg)   BMI 29.41 kg/m   Oriented to time person and place mood affect normal no depression or anxiety skin is intact over the shoulder and cervical spine left side she is tender over the base of the cervical spine left trapezius muscle left periscapular region with normal range of motion and stability of her left shoulder peripheral pulses are intact grip strength is normal  I looked at her old MRIs she has cervical disc disease and her x-ray from 2020 shows cervical spondylosis  I am going to recommend she continue her current medications which include Neurontin  and tramadol and then add Robaxin  Meds ordered this encounter  Medications  . methocarbamol (ROBAXIN) 500 MG tablet    Sig: Take 1 tablet (500 mg total) by mouth 3 (three) times daily.    Dispense:  60 tablet    Refill:  1

## 2020-04-06 ENCOUNTER — Other Ambulatory Visit: Payer: Self-pay

## 2020-04-06 ENCOUNTER — Encounter (HOSPITAL_COMMUNITY): Payer: Self-pay | Admitting: Occupational Therapy

## 2020-04-06 ENCOUNTER — Other Ambulatory Visit: Payer: Self-pay | Admitting: Family Medicine

## 2020-04-06 ENCOUNTER — Ambulatory Visit (HOSPITAL_COMMUNITY): Payer: Medicare Other | Attending: Orthopedic Surgery | Admitting: Occupational Therapy

## 2020-04-06 DIAGNOSIS — M25512 Pain in left shoulder: Secondary | ICD-10-CM | POA: Diagnosis not present

## 2020-04-06 DIAGNOSIS — R29898 Other symptoms and signs involving the musculoskeletal system: Secondary | ICD-10-CM | POA: Insufficient documentation

## 2020-04-06 DIAGNOSIS — G8929 Other chronic pain: Secondary | ICD-10-CM | POA: Insufficient documentation

## 2020-04-06 DIAGNOSIS — M542 Cervicalgia: Secondary | ICD-10-CM | POA: Diagnosis not present

## 2020-04-06 NOTE — Patient Instructions (Signed)
1) (Home) Extension: Isometric / Bilateral Arm Retraction - Sitting   Facing anchor, hold hands and elbow at shoulder height, with elbow bent.  Pull arms back to squeeze shoulder blades together. Repeat 10-15 times. 1-3 times/day.   2) (Clinic) Extension / Flexion (Assist)   Face anchor, pull arms back, keeping elbow straight, and squeze shoulder blades together. Repeat 10-15 times. 1-3 times/day.   Copyright  VHI. All rights reserved.   3) (Home) Retraction: Row - Bilateral (Anchor)   Facing anchor, arms reaching forward, pull hands toward stomach, keeping elbows bent and at your sides and pinching shoulder blades together. Repeat 10-15 times. 1-3 times/day.   Copyright  VHI. All rights reserved.   

## 2020-04-07 NOTE — Therapy (Signed)
[place], [place], [address] Phone: [phone]   Fax:  [phone]  Occupational Therapy Evaluation  Patient Details  Name: Cheryl Abbott MRN: [id number] Date of Birth: June 25, 1945 Referring Provider (OT): Dr. Arther Abbott    Encounter Date: 04/06/2020   OT End of Session - 04/07/20 1414    Visit Number 1    Number of Visits 5    Date for OT Re-Evaluation 05/07/20    Authorization Type [employer] Medicare    Progress Note Due on Visit 10    OT Start Time 1519    OT Stop Time 1552    OT Time Calculation (min) 33 min    Activity Tolerance Patient tolerated treatment well    Behavior During Therapy [facility] for tasks assessed/performed           Past Medical History:  Diagnosis Date  . Abnormal brain scan 02/07/2012  . Anemia   . Anxiety   . Anxiety disorder   . Arthritis   . Brain TIA 08/19/2015  . Cancer ([place])    acute myeloma  . 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 Chronic back pain   . Depression   . Fatty liver   . GERD (gastroesophageal reflux disease)   . Headache   . HEMORRHOIDS 03/15/2009   OCT 2014 HB 12. 9    . History of adenomatous polyp of colon    2008  . History of kidney stones   . Hypercalcemia   . Hypercalcemia 11/30/2010  . Hyperlipidemia   . Hypertension   . Lactose intolerance in adult 02/01/2016  . Left ureteral stone   . Lipoma of arm 01/19/2015  . Lumbar disc disease with radiculopathy   . Nephrolithiasis   . Neuropathic pain of both feet 04/11/2016  . Neuropathy, peripheral   . Nocturnal hypoxia 02/04/2014   Study confirms this done 02/10/2014   . OSA treated with BiPAP    per study 2007  . RENAL CALCULUS 03/15/2009   Qualifier: Diagnosis of  By: Zeb Comfort    . Sciatica   . Shoulder pain, bilateral   . Sigmoid diverticulosis   . TIA (transient ischemic attack) 02/26/2016  . Type 2 diabetes mellitus ([place])   . Vaginal dryness, menopausal 10/14/2016    Past  Surgical History:  Procedure Laterality Date  . BIOPSY N/A 03/17/2013   Procedure: GASTRIC BIOPSIES;  Surgeon: Danie Binder, MD;  Location: [facility];  Service: Endoscopy;  Laterality: N/A;  . BIOPSY  06/10/2015   Procedure: BIOPSY;  Surgeon: Danie Binder, MD;  [facility];  Service: Endoscopy;;  gastric biopsy  . CARDIAC CATHETERIZATION  05-11-2003  dr Shelva Majestic ([facility])   Abnormal cardiolite/   Normal coronary arteries and normal LVF,  ef  63%  . CARDIOVASCULAR STRESS TEST  01-01-2014   normal lexiscan cardiolite/  no ischemia/ infarct/  normal LV function and wall motion ,  ef 81%  . COLONOSCOPY  last one 10/02/2011   MOD [facility] TICS, IH, NEXT TCS APR 2018  . COLONOSCOPY WITH PROPOFOL N/A 02/26/2017   Procedure: COLONOSCOPY WITH PROPOFOL;  Surgeon: Danie Binder, MD;  [facility];  Service: Endoscopy;  Laterality: N/A;  11:00am  . COLONOSCOPY WITH PROPOFOL N/A 09/22/2019   Procedure: COLONOSCOPY WITH PROPOFOL;  Surgeon: Danie Binder, MD;  [facility];  Service: Endoscopy;  Laterality: N/A;  1:15  . CYSTO/   RIGHT URETEROSOCOPY  LASER LITHOTRIPSY STONE EXTRACTION  10-13-2004  . CYSTO/  RIGHT RETROGRADE PYELOGRAM/  PLACMENT RIGHT URETERAL STENT  01-10-2010  . CYSTOSCOPY W/ URETERAL STENT PLACEMENT Right 08/19/2015   Procedure: CYSTOSCOPY WITH RETROGRADE PYELOGRAM/URETERAL STENT PLACEMENT;  Surgeon: Franchot Gallo, MD;  Location: [facility];  Service: Urology;  Laterality: Right;  . CYSTOSCOPY W/ URETERAL STENT PLACEMENT Left 02/23/2020   Procedure: CYSTOSCOPY LEFT  RETROGRADE PYELOGRAM LEFT URETEROSCOPY URETERAL STENT PLACEMENT;  Surgeon: Franchot Gallo, MD;  Location: [facility];  Service: Urology;  Laterality: Left;  . CYSTOSCOPY WITH RETROGRADE PYELOGRAM, URETEROSCOPY AND STENT PLACEMENT Left 10/21/2014   Procedure: CYSTOSCOPY WITH RETROGRADE PYELOGRAM, URETEROSCOPY AND STENT PLACEMENT;  Surgeon: Franchot Gallo, MD;  Location: [facility];  Service: Urology;  Laterality: Left;  . CYSTOSCOPY WITH RETROGRADE PYELOGRAM, URETEROSCOPY AND STENT PLACEMENT Right 11/22/2015   Procedure: CYSTOSCOPY, RIGHT URETERAL STENT REMOVAL; RIGHT RETROGRADE PYELOGRAM, RIGHT URETEROSCOPY WITH BALLOON DILATION; RIGHT URETERAL STENT PLACEMENT;  Surgeon: Franchot Gallo, MD;  Location: [facility];  Service: Urology;  Laterality: Right;  . CYSTOSCOPY/RETROGRADE/URETEROSCOPY/STONE EXTRACTION WITH BASKET Bilateral 08/02/2015   Procedure: CYSTOSCOPY; BILATERAL RETROGRADE PYELOGRAMS; BILATERAL URETEROSCOPY, STONE EXTRACTION WITH BASKET;  Surgeon: Franchot Gallo, MD;  [facility]: [facility];  Service: Urology;  Laterality: Bilateral;  . CYSTOSCOPY/RETROGRADE/URETEROSCOPY/STONE EXTRACTION WITH BASKET Right 06/28/2015   Procedure: CYSTOSCOPY/RETROGRADE/URETEROSCOPY/STONE EXTRACTION WITH BASKET, RIGHT URETERAL DOUBLE J STENT PLACEMENT;  Surgeon: Franchot Gallo, MD;  [facility];  Service: Urology;  Laterality: Right;  . ESOPHAGOGASTRODUODENOSCOPY (EGD) WITH PROPOFOL N/A 03/17/2013   Procedure: ESOPHAGOGASTRODUODENOSCOPY (EGD) WITH PROPOFOL;  Surgeon: Danie Binder, MD;  [facility]: [facility];  Service: Endoscopy;  Laterality: N/A;  . ESOPHAGOGASTRODUODENOSCOPY (EGD) WITH PROPOFOL N/A 06/10/2015   Distal gastritis, distal esophageal stricture s/p dilation  . ESOPHAGOGASTRODUODENOSCOPY (EGD) WITH PROPOFOL N/A 02/26/2017   Procedure: ESOPHAGOGASTRODUODENOSCOPY (EGD) WITH PROPOFOL;  Surgeon: Danie Binder, MD;  [facility];  Service: Endoscopy;  Laterality: N/A;  . FLEXIBLE SIGMOIDOSCOPY  09/11/2011   BDZ:[id number] Internal hemorrhoids  . HOLMIUM LASER APPLICATION Left 8/34/1962   Procedure: HOLMIUM LASER APPLICATION;  Surgeon: Franchot Gallo, MD;  Location: [facility];  Service: Urology;  Laterality: Left;  . HOLMIUM LASER APPLICATION Right 2/29/7989   Procedure: HOLMIUM LASER APPLICATION;  Surgeon: Franchot Gallo, MD;   Location: [facility];  Service: Urology;  Laterality: Right;  . HOLMIUM LASER APPLICATION  07/23/9415   Procedure: HOLMIUM LASER APPLICATION;  Surgeon: Franchot Gallo, MD;  Location: [facility];  Service: Urology;;  . MASS EXCISION Left 03/04/2015   Procedure: EXCISION OF SOFT TISSUE NEOPLASM LEFT ARM;  Surgeon: Aviva Signs, MD;  Location: [facility];  Service: General;  Laterality: Left;  . PERCUTANEOUS NEPHROSTOLITHOTOMY Bilateral 12/  2011     [facility]  . POLYPECTOMY N/A 03/17/2013   Procedure: GASTRIC POLYPECTOMY;  Surgeon: Danie Binder, MD;  Location: [facility];  Service: Endoscopy;  Laterality: N/A;  . POLYPECTOMY  02/26/2017   Procedure: POLYPECTOMY;  Surgeon: Danie Binder, MD;  [facility];  Service: Endoscopy;;  polyp at cecum, ascending colon polyps x3, hepatic flexure polyps x8, transverse colon polyps x8   . POLYPECTOMY  09/22/2019   Procedure: POLYPECTOMY;  Surgeon: Danie Binder, MD;  [facility];  Service: Endoscopy;;  . REMOVAL RIGHT THIGH CYST  2006  . SAVORY DILATION N/A 03/17/2013   Procedure: SAVORY DILATION;  Surgeon: Danie Binder, MD;  [facility];  Service: Endoscopy;  Laterality: N/A;  #12.8, 14, 15, 16 dilators used  .  SAVORY DILATION N/A 06/10/2015   Procedure: SAVORY DILATION;  Surgeon: Danie Binder, MD;  [facility];  Service: Endoscopy;  Laterality: N/A;  . TRANSTHORACIC ECHOCARDIOGRAM  01-01-2014   mild LVH/  ef 08-14%/  grade I diastolic dysfunction/  trivial MR, TR, and PR  . VAGINAL HYSTERECTOMY  1970's    There were no vitals filed for this visit.   Subjective Assessment - 04/07/20 1411    Subjective  S: My neck and shoulder have been hurting since last year, mainly at night.    Pertinent History Pt is a 74 y/o female presenting with left shoulder and cervical neck pain beginning in 2020, pt is unsure of exactly when pain began. Pt has been seen in the past by neurosurgery but declined surgical intervention. Pt was  referred to occupational therapy for evaluation and treatment by Dr. Arther Abbott.    Special Tests FOTO: 54/100    Patient Stated Goals to have less pain in my neck and shoulder    Currently in Pain? No/denies             [facility] OT Assessment - 04/06/20 1522      Assessment   Medical Diagnosis left shoulder pain    Referring Provider (OT) Dr. Arther Abbott     Onset Date/Surgical Date --   unsure-2020   Hand Dominance Right    Next MD Visit None scheduled    Prior Therapy None      Precautions   Precautions None      Restrictions   Weight Bearing Restrictions No      Balance Screen   Has the patient fallen in the past 6 months No    Has the patient had a decrease in activity level because of a fear of falling?  No    Is the patient reluctant to leave their home because of a fear of falling?  No      Prior Function   Level of Independence Independent    Vocation Retired    Leisure housework, watching TV, reading, writing spiritual poetry and songs      ADL   ADL comments Pt is having difficulty with sleeping due to shoulder pain, reaching overhead and behind back. Pt has difficulty with lifting objects such as groceries.       Observation/Other Assessments   Focus on Therapeutic Outcomes (FOTO)  54/100      ROM / Strength   AROM / PROM / Strength AROM;Strength      Palpation   Palpation comment mod/max fascial restrictions in left trapezius and scapular regions      AROM   Overall AROM Comments Assessed seated, er/IR adducted    AROM Assessment Site Shoulder;Cervical    Right/Left Shoulder Left    Left Shoulder Flexion 170 Degrees    Left Shoulder ABduction 165 Degrees    Left Shoulder Internal Rotation 90 Degrees    Left Shoulder External Rotation 55 Degrees    Cervical Flexion 40   WFL   Cervical Extension 45   WNL   Cervical - Right Side Bend 45   WNL   Cervical - Left Side Bend 45   WNL   Cervical - Right Rotation 78   WNL   Cervical - Left  Rotation 62   WFL     Strength   Overall Strength Comments Assessed seated, er/IR adducted    Strength Assessment Site Shoulder    Right/Left Shoulder Left    Left  Shoulder Flexion 5/5    Left Shoulder ABduction 5/5    Left Shoulder Internal Rotation 5/5    Left Shoulder External Rotation 4+/5                           OT Education - 04/07/20 1413    Education Details red scapular theraband    Person(s) Educated Patient    Methods Explanation;Demonstration;Handout    Comprehension Verbalized understanding;Returned demonstration            OT Short Term Goals - 04/07/20 1417      OT SHORT TERM GOAL #1   Title Pt will be provided with and educated on HEP to improve LUE strength and activity tolerance required for ADL completion.    Time 4    Period Weeks    Status New    Target Date 05/07/20      OT SHORT TERM GOAL #2   Title Pt will decrease pain in LUE to 3/10 or less to improve ability to sleep for 4+ hours at night without waking due to pain.    Time 4    Period Weeks    Status New      OT SHORT TERM GOAL #3   Title Pt will decrease LUE fascial restrictions to minimal amounts or less to improve mobility required for functional reaching tasks.    Time 4    Period Weeks    Status New      OT SHORT TERM GOAL #4   Title Pt will increase LUE activity tolerance to improve ability to complete tasks requiring sustained use of the LUE with minimal breaks, such as cooking.    Time 4    Period Weeks    Status New                    Plan - 04/07/20 1415    Clinical Impression Statement A: Pt is a 74 y/o female presenting with chronic left cervical radiculopathy and shoulder pain presenting with intermittent pain interfering with ADL completion and sleep. Pt demonstrates ROM and strength WNL, reports pain is mostly at night after using her arm during the day.    OT Occupational Profile and History Problem Focused Assessment - Including review of  records relating to presenting problem    Occupational performance deficits (Please refer to evaluation for details): ADL's;IADL's;Rest and Sleep    Body Structure / Function / Physical Skills ADL;Muscle spasms;UE functional use;Fascial restriction;Pain;ROM;Strength;IADL    Rehab Potential Good    Clinical Decision Making Limited treatment options, no task modification necessary    Comorbidities Affecting Occupational Performance: None    Modification or Assistance to Complete Evaluation  No modification of tasks or assist necessary to complete eval    OT Frequency 1x / week    OT Duration 4 weeks    OT Treatment/Interventions Self-care/ADL training;Ultrasound;Patient/family education;Passive range of motion;Cryotherapy;Electrical Stimulation;Moist Heat;Therapeutic exercise;Manual Therapy;Therapeutic activities    Plan P: pt will benefit from skilled OT services to decrease pain and fascial restrictions and increase functional use of LUE during ADLs. Treatment plan: myofascial release and manual techniques, passive stretching, A/ROM, general LUE strengthening, scapular stability and strengthening.    OT Home Exercise Plan eval: red scapular theraband    Consulted and Agree with Plan of Care Patient           Patient will benefit from skilled therapeutic intervention in order to improve the following deficits and  impairments:   Body Structure / Function / Physical Skills: ADL, Muscle spasms, UE functional use, Fascial restriction, Pain, ROM, Strength, IADL       Visit Diagnosis: Chronic left shoulder pain  Cervicalgia  Other symptoms and signs involving the musculoskeletal system    Problem List Patient Active Problem List   Diagnosis Date Noted  . Iron deficiency anemia 02/10/2020  . Flank pain 01/11/2020  . Pelvic pressure in female 11/17/2019  . Adenomatous polyps   . Cervical spondylosis 07/13/2019  . Cervical spondylosis with radiculopathy 07/13/2019  . Muscle cramps  07/01/2019  . Depression, major, single episode, in partial remission ([place]) 03/13/2019  . Annual physical exam 03/12/2019  . Eyelid cyst, right 01/15/2019  . 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 Shoulder pain, left 02/28/2018  . Palpable mass of neck 02/28/2018  . Insomnia 11/09/2017  . Multiple myeloma ([place]) 03/15/2017  . Urinary frequency 02/28/2017  . 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 CKD (chronic kidney disease) stage 3, GFR 30-59 ml/min ([facility]) 07/12/2015  . Anemia 10/14/2014  . Lumbar back pain with radiculopathy affecting left lower extremity 05/11/2014  . Obesity (BMI 30.0-34.9) 01/02/2014  . Generalized anxiety disorder 05/20/2013  . Anxiety and depression 08/13/2012  . Thoracic or lumbosacral neuritis or radiculitis, unspecified 12/22/2010  . Abdominal bloating 09/08/2009  . Colon adenomas 05/11/2009  . GERD 03/15/2009  . FATTY LIVER DISEASE 03/15/2009  . Sleep apnea 09/15/2008  . 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 Hyperlipidemia LDL goal <100 09/17/2007  . Hypertension goal BP (blood pressure) < 130/80 09/17/2007   Guadelupe Sabin, OTR/L  [phone] 04/07/2020, 2:20 PM  [place], [place], [address] Phone: [phone]   Fax:  [phone]  Name: LEE KALT MRN: [id number] Date of Birth: July 23, 1945

## 2020-04-12 ENCOUNTER — Encounter (HOSPITAL_COMMUNITY): Payer: Self-pay

## 2020-04-12 ENCOUNTER — Other Ambulatory Visit: Payer: Self-pay

## 2020-04-12 ENCOUNTER — Ambulatory Visit (HOSPITAL_COMMUNITY): Payer: Medicare Other | Attending: Orthopedic Surgery

## 2020-04-12 DIAGNOSIS — R29898 Other symptoms and signs involving the musculoskeletal system: Secondary | ICD-10-CM | POA: Insufficient documentation

## 2020-04-12 DIAGNOSIS — M542 Cervicalgia: Secondary | ICD-10-CM | POA: Diagnosis not present

## 2020-04-12 DIAGNOSIS — M25512 Pain in left shoulder: Secondary | ICD-10-CM | POA: Insufficient documentation

## 2020-04-12 DIAGNOSIS — G8929 Other chronic pain: Secondary | ICD-10-CM | POA: Insufficient documentation

## 2020-04-12 NOTE — Therapy (Signed)
[place], [place], [address] Phone: [phone]   Fax:  [phone]  Occupational Therapy Treatment  Patient Details  Name: Cheryl Abbott MRN: [id number] Date of Birth: [birth year] Referring Provider (OT): Dr. Arther Abbott    Encounter Date: 04/12/2020   OT End of Session - 04/12/20 1309    Visit Number 2    Number of Visits 5    Date for OT Re-Evaluation 05/07/20    Authorization Type [employer] Medicare    Progress Note Due on Visit 10    OT Start Time 1301    OT Stop Time 1345    OT Time Calculation (min) 44 min    Activity Tolerance Patient tolerated treatment well    Behavior During Therapy [facility] for tasks assessed/performed           Past Medical History:  Diagnosis Date  . Abnormal brain scan 02/07/2012  . Anemia   . Anxiety   . Anxiety disorder   . Arthritis   . Brain TIA 08/19/2015  . Cancer ([place])    acute myeloma  . 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 Chronic back pain   . Depression   . Fatty liver   . GERD (gastroesophageal reflux disease)   . Headache   . HEMORRHOIDS 03/15/2009   OCT 2014 HB 12. 9    . History of adenomatous polyp of colon    2008  . History of kidney stones   . Hypercalcemia   . Hypercalcemia 11/30/2010  . Hyperlipidemia   . Hypertension   . Lactose intolerance in adult 02/01/2016  . Left ureteral stone   . Lipoma of arm 01/19/2015  . Lumbar disc disease with radiculopathy   . Nephrolithiasis   . Neuropathic pain of both feet 04/11/2016  . Neuropathy, peripheral   . Nocturnal hypoxia 02/04/2014   Study confirms this done 02/10/2014   . OSA treated with BiPAP    per study 2007  . RENAL CALCULUS 03/15/2009   Qualifier: Diagnosis of  By: Zeb Comfort    . Sciatica   . Shoulder pain, bilateral   . Sigmoid diverticulosis   . TIA (transient ischemic attack) 02/26/2016  . Type 2 diabetes mellitus ([place])   . Vaginal dryness, menopausal 10/14/2016    Past  Surgical History:  Procedure Laterality Date  . BIOPSY N/A 03/17/2013   Procedure: GASTRIC BIOPSIES;  Surgeon: Danie Binder, MD;  Location: [facility];  Service: Endoscopy;  Laterality: N/A;  . BIOPSY  06/10/2015   Procedure: BIOPSY;  Surgeon: Danie Binder, MD;  [facility];  Service: Endoscopy;;  gastric biopsy  . CARDIAC CATHETERIZATION  05-11-2003  dr Shelva Majestic ([facility])   Abnormal cardiolite/   Normal coronary arteries and normal LVF,  ef  63%  . CARDIOVASCULAR STRESS TEST  01-01-2014   normal lexiscan cardiolite/  no ischemia/ infarct/  normal LV function and wall motion ,  ef 81%  . COLONOSCOPY  last one 10/02/2011   MOD [place] TICS, IH, NEXT TCS APR 2018  . COLONOSCOPY WITH PROPOFOL N/A 02/26/2017   Procedure: COLONOSCOPY WITH PROPOFOL;  Surgeon: Danie Binder, MD;  [facility];  Service: Endoscopy;  Laterality: N/A;  11:00am  . COLONOSCOPY WITH PROPOFOL N/A 09/22/2019   Procedure: COLONOSCOPY WITH PROPOFOL;  Surgeon: Danie Binder, MD;  [facility];  Service: Endoscopy;  Laterality: N/A;  1:15  . CYSTO/   RIGHT URETEROSOCOPY  LASER LITHOTRIPSY STONE EXTRACTION  10-13-2004  . CYSTO/  RIGHT RETROGRADE PYELOGRAM/  PLACMENT RIGHT URETERAL STENT  01-10-2010  . CYSTOSCOPY W/ URETERAL STENT PLACEMENT Right 08/19/2015   Procedure: CYSTOSCOPY WITH RETROGRADE PYELOGRAM/URETERAL STENT PLACEMENT;  Surgeon: Franchot Gallo, MD;  Location: [facility];  Service: Urology;  Laterality: Right;  . CYSTOSCOPY W/ URETERAL STENT PLACEMENT Left 02/23/2020   Procedure: CYSTOSCOPY LEFT  RETROGRADE PYELOGRAM LEFT URETEROSCOPY URETERAL STENT PLACEMENT;  Surgeon: Franchot Gallo, MD;  Location: [facility];  Service: Urology;  Laterality: Left;  . CYSTOSCOPY WITH RETROGRADE PYELOGRAM, URETEROSCOPY AND STENT PLACEMENT Left 10/21/2014   Procedure: CYSTOSCOPY WITH RETROGRADE PYELOGRAM, URETEROSCOPY AND STENT PLACEMENT;  Surgeon: Franchot Gallo, MD;  [facility];  Service: Urology;  Laterality: Left;  . CYSTOSCOPY WITH RETROGRADE PYELOGRAM, URETEROSCOPY AND STENT PLACEMENT Right 11/22/2015   Procedure: CYSTOSCOPY, RIGHT URETERAL STENT REMOVAL; RIGHT RETROGRADE PYELOGRAM, RIGHT URETEROSCOPY WITH BALLOON DILATION; RIGHT URETERAL STENT PLACEMENT;  Surgeon: Franchot Gallo, MD;  Location: [facility];  Service: Urology;  Laterality: Right;  . CYSTOSCOPY/RETROGRADE/URETEROSCOPY/STONE EXTRACTION WITH BASKET Bilateral 08/02/2015   Procedure: CYSTOSCOPY; BILATERAL RETROGRADE PYELOGRAMS; BILATERAL URETEROSCOPY, STONE EXTRACTION WITH BASKET;  Surgeon: Franchot Gallo, MD;  [facility]: [facility];  Service: Urology;  Laterality: Bilateral;  . CYSTOSCOPY/RETROGRADE/URETEROSCOPY/STONE EXTRACTION WITH BASKET Right 06/28/2015   Procedure: CYSTOSCOPY/RETROGRADE/URETEROSCOPY/STONE EXTRACTION WITH BASKET, RIGHT URETERAL DOUBLE J STENT PLACEMENT;  Surgeon: Franchot Gallo, MD;  [facility]: [facility];  Service: Urology;  Laterality: Right;  . ESOPHAGOGASTRODUODENOSCOPY (EGD) WITH PROPOFOL N/A 03/17/2013   Procedure: ESOPHAGOGASTRODUODENOSCOPY (EGD) WITH PROPOFOL;  Surgeon: Danie Binder, MD;  [facility]: [facility];  Service: Endoscopy;  Laterality: N/A;  . ESOPHAGOGASTRODUODENOSCOPY (EGD) WITH PROPOFOL N/A 06/10/2015   Distal gastritis, distal esophageal stricture s/p dilation  . ESOPHAGOGASTRODUODENOSCOPY (EGD) WITH PROPOFOL N/A 02/26/2017   Procedure: ESOPHAGOGASTRODUODENOSCOPY (EGD) WITH PROPOFOL;  Surgeon: Danie Binder, MD;  [facility];  Service: Endoscopy;  Laterality: N/A;  . FLEXIBLE SIGMOIDOSCOPY  09/11/2011   WHQ:[id number] Internal hemorrhoids  . HOLMIUM LASER APPLICATION Left 6/65/9935   Procedure: HOLMIUM LASER APPLICATION;  Surgeon: Franchot Gallo, MD;  Location[facility];  Service: Urology;  Laterality: Left;  . HOLMIUM LASER APPLICATION Right 12/10/7791   Procedure: HOLMIUM LASER APPLICATION;  Surgeon: Franchot Gallo, MD;   [facility];  Service: Urology;  Laterality: Right;  . HOLMIUM LASER APPLICATION  02/12/91   Procedure: HOLMIUM LASER APPLICATION;  Surgeon: Franchot Gallo, MD;  Location: [facility];  Service: Urology;;  . MASS EXCISION Left 03/04/2015   Procedure: EXCISION OF SOFT TISSUE NEOPLASM LEFT ARM;  Surgeon: Aviva Signs, MD;  Location: [facility];  Service: General;  Laterality: Left;  . PERCUTANEOUS NEPHROSTOLITHOTOMY Bilateral 12/  2011     [facility]  . POLYPECTOMY N/A 03/17/2013   Procedure: GASTRIC POLYPECTOMY;  Surgeon: Danie Binder, MD;  Location: [facility];  Service: Endoscopy;  Laterality: N/A;  . POLYPECTOMY  02/26/2017   Procedure: POLYPECTOMY;  Surgeon: Danie Binder, MD;  [facility];  Service: Endoscopy;;  polyp at cecum, ascending colon polyps x3, hepatic flexure polyps x8, transverse colon polyps x8   . POLYPECTOMY  09/22/2019   Procedure: POLYPECTOMY;  Surgeon: Danie Binder, MD;  [facility];  Service: Endoscopy;;  . REMOVAL RIGHT THIGH CYST  2006  . SAVORY DILATION N/A 03/17/2013   Procedure: SAVORY DILATION;  Surgeon: Danie Binder, MD;  Location: [facility];  Service: Endoscopy;  Laterality: N/A;  #12.8, 14, 15, 16 dilators used  .  SAVORY DILATION N/A 06/10/2015   Procedure: SAVORY DILATION;  Surgeon: Danie Binder, MD;  [facility];  Service: Endoscopy;  Laterality: N/A;  . TRANSTHORACIC ECHOCARDIOGRAM  01-01-2014   mild LVH/  ef 16-10%/  grade I diastolic dysfunction/  trivial MR, TR, and PR  . VAGINAL HYSTERECTOMY  1970's    There were no vitals filed for this visit.   Subjective Assessment - 04/12/20 1305    Subjective  S: I'm doing pretty good today    Currently in Pain? No/denies              [facility] OT Assessment - 04/12/20 1311      Assessment   Medical Diagnosis left shoulder pain      Precautions   Precautions None                    OT Treatments/Exercises (OP) - 04/12/20 1311      Exercises   Exercises  Shoulder      Shoulder Exercises: Supine   Protraction PROM;5 reps;AROM;10 reps    Horizontal ABduction PROM;5 reps;AROM;10 reps    External Rotation PROM;5 reps;AROM;10 reps    Internal Rotation PROM;5 reps;AROM;10 reps    Flexion PROM;5 reps;AROM;10 reps    ABduction PROM;5 reps;AROM;10 reps      Shoulder Exercises: Standing   Extension Theraband;10 reps    Theraband Level (Shoulder Extension) Level 2 (Red)    Row Theraband;10 reps    Theraband Level (Shoulder Row) Level 2 (Red)    Retraction Theraband;10 reps    Theraband Level (Shoulder Retraction) Level 2 (Red)      Shoulder Exercises: ROM/Strengthening   Wall Wash 1'      Manual Therapy   Manual Therapy Soft tissue mobilization    Manual therapy comments Manual complete separate than rest of tx    Soft tissue mobilization STM completed to left upper arm and trapezius region to decrease fascial restrictions for increased mobility in a pain free zone                    OT Short Term Goals - 04/12/20 1306      OT SHORT TERM GOAL #1   Title Pt will be provided with and educated on HEP to improve LUE strength and activity tolerance required for ADL completion.    Time 4    Period Weeks    Status On-going    Target Date 05/07/20      OT SHORT TERM GOAL #2   Title Pt will decrease pain in LUE to 3/10 or less to improve ability to sleep for 4+ hours at night without waking due to pain.    Time 4    Period Weeks    Status On-going      OT SHORT TERM GOAL #3   Title Pt will decrease LUE fascial restrictions to minimal amounts or less to improve mobility required for functional reaching tasks.    Time 4    Period Weeks    Status On-going      OT SHORT TERM GOAL #4   Title Pt will increase LUE activity tolerance to improve ability to complete tasks requiring sustained use of the LUE with minimal breaks, such as cooking.    Time 4    Period Weeks    Status On-going                    Plan -  04/12/20 1331  Clinical Impression Statement A: completed manual techniques to left upper arm and trapezius region to address fascial restrictions. Patient with sensitivity to touch, requesting lighter pressure. Passive stretches completed with P/ROM WFL, however slight catching sensation reported with flexion and abduction. Improvement in pain when patient advised to let arm relax and be heavy throughout stretching. Completed A/ROM supine, followed by scapular strengthening with VC and tactile cues required for form and technique.    Body Structure / Function / Physical Skills ADL;Muscle spasms;UE functional use;Fascial restriction;Pain;ROM;Strength;IADL    Plan P: Progress to A/ROM standing, continue scapular strengthening, focus on form with scapular theraband    Consulted and Agree with Plan of Care Patient           Patient will benefit from skilled therapeutic intervention in order to improve the following deficits and impairments:   Body Structure / Function / Physical Skills: ADL, Muscle spasms, UE functional use, Fascial restriction, Pain, ROM, Strength, IADL       Visit Diagnosis: Chronic left shoulder pain  Cervicalgia  Other symptoms and signs involving the musculoskeletal system    Problem List Patient Active Problem List   Diagnosis Date Noted  . Iron deficiency anemia 02/10/2020  . Flank pain 01/11/2020  . Pelvic pressure in female 11/17/2019  . Adenomatous polyps   . Cervical spondylosis 07/13/2019  . Cervical spondylosis with radiculopathy 07/13/2019  . Muscle cramps 07/01/2019  . Depression, major, single episode, in partial remission ([place]) 03/13/2019  . Annual physical exam 03/12/2019  . Eyelid cyst, right 01/15/2019  . 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 Shoulder pain, left 02/28/2018  . Palpable mass of neck 02/28/2018  . Insomnia 11/09/2017  . Multiple myeloma ([place]) 03/15/2017  . Urinary frequency 02/28/2017  . Monoclonal paraproteinemia  02/15/2017  . CKD (chronic kidney disease) stage 3, GFR 30-59 ml/min ([facility]) 07/12/2015  . Anemia 10/14/2014  . Lumbar back pain with radiculopathy affecting left lower extremity 05/11/2014  . Obesity (BMI 30.0-34.9) 01/02/2014  . Generalized anxiety disorder 05/20/2013  . Anxiety and depression 08/13/2012  . Thoracic or lumbosacral neuritis or radiculitis, unspecified 12/22/2010  . Abdominal bloating 09/08/2009  . Colon adenomas 05/11/2009  . GERD 03/15/2009  . FATTY LIVER DISEASE 03/15/2009  . Sleep apnea 09/15/2008  . 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 Hyperlipidemia LDL goal <100 09/17/2007  . Hypertension goal BP (blood pressure) < 130/80 09/17/2007    [place], [place] Student [phone]   04/12/2020, 1:55 PM  [place], [place], [address] Phone: [phone]   Fax:  [phone]  Name: JOZELYN KUWAHARA MRN: [id number] Date of Birth: 08-29-1945

## 2020-04-15 ENCOUNTER — Telehealth (HOSPITAL_COMMUNITY): Payer: Self-pay | Admitting: Specialist

## 2020-04-15 NOTE — Telephone Encounter (Signed)
pt cancelled appt for 11/08 because she is having trouble with pain

## 2020-04-18 ENCOUNTER — Ambulatory Visit (HOSPITAL_COMMUNITY): Payer: Medicare Other | Admitting: Specialist

## 2020-04-19 ENCOUNTER — Other Ambulatory Visit: Payer: Self-pay | Admitting: Family Medicine

## 2020-04-19 DIAGNOSIS — R0902 Hypoxemia: Secondary | ICD-10-CM | POA: Diagnosis not present

## 2020-04-22 ENCOUNTER — Other Ambulatory Visit: Payer: Self-pay

## 2020-04-22 ENCOUNTER — Ambulatory Visit (HOSPITAL_COMMUNITY)
Admission: RE | Admit: 2020-04-22 | Discharge: 2020-04-22 | Disposition: A | Discharge status: Home / Self Care | Payer: Medicare Other | Source: Ambulatory Visit | Attending: Urology | Admitting: Urology

## 2020-04-22 DIAGNOSIS — N201 Calculus of ureter: Secondary | ICD-10-CM

## 2020-04-22 DIAGNOSIS — N200 Calculus of kidney: Secondary | ICD-10-CM | POA: Diagnosis not present

## 2020-04-22 DIAGNOSIS — N2889 Other specified disorders of kidney and ureter: Secondary | ICD-10-CM | POA: Diagnosis not present

## 2020-04-26 ENCOUNTER — Ambulatory Visit (INDEPENDENT_AMBULATORY_CARE_PROVIDER_SITE_OTHER): Payer: Medicare Other | Admitting: Urology

## 2020-04-26 ENCOUNTER — Ambulatory Visit (HOSPITAL_COMMUNITY): Payer: Medicare Other

## 2020-04-26 ENCOUNTER — Encounter (HOSPITAL_COMMUNITY): Payer: Self-pay

## 2020-04-26 ENCOUNTER — Encounter: Payer: Self-pay | Admitting: Urology

## 2020-04-26 ENCOUNTER — Other Ambulatory Visit: Payer: Self-pay

## 2020-04-26 VITALS — BP 118/71 | HR 98 | Temp 98.9°F | Ht 65.5 in | Wt 188.0 lb

## 2020-04-26 DIAGNOSIS — N201 Calculus of ureter: Secondary | ICD-10-CM

## 2020-04-26 DIAGNOSIS — M25512 Pain in left shoulder: Secondary | ICD-10-CM | POA: Diagnosis not present

## 2020-04-26 DIAGNOSIS — M542 Cervicalgia: Secondary | ICD-10-CM | POA: Diagnosis not present

## 2020-04-26 DIAGNOSIS — G8929 Other chronic pain: Secondary | ICD-10-CM | POA: Diagnosis not present

## 2020-04-26 DIAGNOSIS — R29898 Other symptoms and signs involving the musculoskeletal system: Secondary | ICD-10-CM | POA: Diagnosis not present

## 2020-04-26 DIAGNOSIS — N200 Calculus of kidney: Secondary | ICD-10-CM

## 2020-04-26 NOTE — Therapy (Signed)
[place], [place], [address] Phone: [phone]   Fax:  [phone]  Occupational Therapy Treatment  Patient Details  Name: Cheryl Abbott MRN: [id number] Date of Birth: 1946/04/16 Referring Provider (OT): Dr. Arther Abbott    Encounter Date: 04/26/2020   OT End of Session - 04/26/20 1704    Visit Number 3    Number of Visits 5    Date for OT Re-Evaluation 05/07/20    Authorization Type [employer] Medicare    Progress Note Due on Visit 10    OT Start Time 1635    OT Stop Time 1720    OT Time Calculation (min) 45 min    Activity Tolerance Patient limited by pain    Behavior During Therapy [facility] for tasks assessed/performed           Past Medical History:  Diagnosis Date  . Abnormal brain scan 02/07/2012  . Anemia   . Anxiety   . Anxiety disorder   . Arthritis   . Brain TIA 08/19/2015  . Cancer ([place])    acute myeloma  . 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 Chronic back pain   . Depression   . Fatty liver   . GERD (gastroesophageal reflux disease)   . Headache   . HEMORRHOIDS 03/15/2009   OCT 2014 HB 12. 9    . History of adenomatous polyp of colon    2008  . History of kidney stones   . Hypercalcemia   . Hypercalcemia 11/30/2010  . Hyperlipidemia   . Hypertension   . Lactose intolerance in adult 02/01/2016  . Left ureteral stone   . Lipoma of arm 01/19/2015  . Lumbar disc disease with radiculopathy   . Nephrolithiasis   . Neuropathic pain of both feet 04/11/2016  . Neuropathy, peripheral   . Nocturnal hypoxia 02/04/2014   Study confirms this done 02/10/2014   . OSA treated with BiPAP    per study 2007  . RENAL CALCULUS 03/15/2009   Qualifier: Diagnosis of  By: Zeb Comfort    . Sciatica   . Shoulder pain, bilateral   . Sigmoid diverticulosis   . TIA (transient ischemic attack) 02/26/2016  . Type 2 diabetes mellitus ([place])   . Vaginal dryness, menopausal 10/14/2016    Past Surgical  History:  Procedure Laterality Date  . BIOPSY N/A 03/17/2013   Procedure: GASTRIC BIOPSIES;  Surgeon: Danie Binder, MD;  Location: [facility];  Service: Endoscopy;  Laterality: N/A;  . BIOPSY  06/10/2015   Procedure: BIOPSY;  Surgeon: Danie Binder, MD;  [facility];  Service: Endoscopy;;  gastric biopsy  . CARDIAC CATHETERIZATION  05-11-2003  dr Shelva Majestic ([facility])   Abnormal cardiolite/   Normal coronary arteries and normal LVF,  ef  63%  . CARDIOVASCULAR STRESS TEST  01-01-2014   normal lexiscan cardiolite/  no ischemia/ infarct/  normal LV function and wall motion ,  ef 81%  . COLONOSCOPY  last one 10/02/2011   MOD [place] TICS, IH, NEXT TCS APR 2018  . COLONOSCOPY WITH PROPOFOL N/A 02/26/2017   Procedure: COLONOSCOPY WITH PROPOFOL;  Surgeon: Danie Binder, MD;  [facility];  Service: Endoscopy;  Laterality: N/A;  11:00am  . COLONOSCOPY WITH PROPOFOL N/A 09/22/2019   Procedure: COLONOSCOPY WITH PROPOFOL;  Surgeon: Danie Binder, MD;  [facility];  Service: Endoscopy;  Laterality: N/A;  1:15  . CYSTO/   RIGHT URETEROSOCOPY  LASER LITHOTRIPSY STONE EXTRACTION  10-13-2004  . CYSTO/  RIGHT RETROGRADE PYELOGRAM/  PLACMENT RIGHT URETERAL STENT  01-10-2010  . CYSTOSCOPY W/ URETERAL STENT PLACEMENT Right 08/19/2015   Procedure: CYSTOSCOPY WITH RETROGRADE PYELOGRAM/URETERAL STENT PLACEMENT;  Surgeon: Franchot Gallo, MD;  Location: [facility];  Service: Urology;  Laterality: Right;  . CYSTOSCOPY W/ URETERAL STENT PLACEMENT Left 02/23/2020   Procedure: CYSTOSCOPY LEFT  RETROGRADE PYELOGRAM LEFT URETEROSCOPY URETERAL STENT PLACEMENT;  Surgeon: Franchot Gallo, MD;  [facility]: [facility];  Service: Urology;  Laterality: Left;  . CYSTOSCOPY WITH RETROGRADE PYELOGRAM, URETEROSCOPY AND STENT PLACEMENT Left 10/21/2014   Procedure: CYSTOSCOPY WITH RETROGRADE PYELOGRAM, URETEROSCOPY AND STENT PLACEMENT;  Surgeon: Franchot Gallo, MD;  Location: [facility];  Service: Urology;  Laterality: Left;  . CYSTOSCOPY WITH RETROGRADE PYELOGRAM, URETEROSCOPY AND STENT PLACEMENT Right 11/22/2015   Procedure: CYSTOSCOPY, RIGHT URETERAL STENT REMOVAL; RIGHT RETROGRADE PYELOGRAM, RIGHT URETEROSCOPY WITH BALLOON DILATION; RIGHT URETERAL STENT PLACEMENT;  Surgeon: Franchot Gallo, MD;  [facility];  Service: Urology;  Laterality: Right;  . CYSTOSCOPY/RETROGRADE/URETEROSCOPY/STONE EXTRACTION WITH BASKET Bilateral 08/02/2015   Procedure: CYSTOSCOPY; BILATERAL RETROGRADE PYELOGRAMS; BILATERAL URETEROSCOPY, STONE EXTRACTION WITH BASKET;  Surgeon: Franchot Gallo, MD;  [facility];  Service: Urology;  Laterality: Bilateral;  . CYSTOSCOPY/RETROGRADE/URETEROSCOPY/STONE EXTRACTION WITH BASKET Right 06/28/2015   Procedure: CYSTOSCOPY/RETROGRADE/URETEROSCOPY/STONE EXTRACTION WITH BASKET, RIGHT URETERAL DOUBLE J STENT PLACEMENT;  Surgeon: Franchot Gallo, MD;  [facility];  Service: Urology;  Laterality: Right;  . ESOPHAGOGASTRODUODENOSCOPY (EGD) WITH PROPOFOL N/A 03/17/2013   Procedure: ESOPHAGOGASTRODUODENOSCOPY (EGD) WITH PROPOFOL;  Surgeon: Danie Binder, MD;  [facility];  Service: Endoscopy;  Laterality: N/A;  . ESOPHAGOGASTRODUODENOSCOPY (EGD) WITH PROPOFOL N/A 06/10/2015   Distal gastritis, distal esophageal stricture s/p dilation  . ESOPHAGOGASTRODUODENOSCOPY (EGD) WITH PROPOFOL N/A 02/26/2017   Procedure: ESOPHAGOGASTRODUODENOSCOPY (EGD) WITH PROPOFOL;  Surgeon: Danie Binder, MD;  [facility];  Service: Endoscopy;  Laterality: N/A;  . FLEXIBLE SIGMOIDOSCOPY  09/11/2011   [id number] Internal hemorrhoids  . HOLMIUM LASER APPLICATION Left 9/81/1914   Procedure: HOLMIUM LASER APPLICATION;  Surgeon: Franchot Gallo, MD;  Location: [facility];  Service: Urology;  Laterality: Left;  . HOLMIUM LASER APPLICATION Right 7/82/9562   Procedure: HOLMIUM LASER APPLICATION;  Surgeon: Franchot Gallo, MD;  [facility];  Service: Urology;  Laterality: Right;  . HOLMIUM LASER APPLICATION  07/11/8655   Procedure: HOLMIUM LASER APPLICATION;  Surgeon: Franchot Gallo, MD;  [facility];  Service: Urology;;  . MASS EXCISION Left 03/04/2015   Procedure: EXCISION OF SOFT TISSUE NEOPLASM LEFT ARM;  Surgeon: Aviva Signs, MD;  Location: [facility];  Service: General;  Laterality: Left;  . PERCUTANEOUS NEPHROSTOLITHOTOMY Bilateral 12/  2011     [facility]  . POLYPECTOMY N/A 03/17/2013   Procedure: GASTRIC POLYPECTOMY;  Surgeon: Danie Binder, MD;  [facility];  Service: Endoscopy;  Laterality: N/A;  . POLYPECTOMY  02/26/2017   Procedure: POLYPECTOMY;  Surgeon: Danie Binder, MD;  [facility];  Service: Endoscopy;;  polyp at cecum, ascending colon polyps x3, hepatic flexure polyps x8, transverse colon polyps x8   . POLYPECTOMY  09/22/2019   Procedure: POLYPECTOMY;  Surgeon: Danie Binder, MD;  [facility];  Service: Endoscopy;;  . REMOVAL RIGHT THIGH CYST  2006  . SAVORY DILATION N/A 03/17/2013   Procedure: SAVORY DILATION;  Surgeon: Danie Binder, MD;  [facility];  Service: Endoscopy;  Laterality: N/A;  #12.8, 14, 15, 16 dilators used  .  SAVORY DILATION N/A 06/10/2015   Procedure: SAVORY DILATION;  Surgeon: Danie Binder, MD;  [facility];  Service: Endoscopy;  Laterality: N/A;  . TRANSTHORACIC ECHOCARDIOGRAM  01-01-2014   mild LVH/  ef 35-46%/  grade I diastolic dysfunction/  trivial MR, TR, and PR  . VAGINAL HYSTERECTOMY  1970's    There were no vitals filed for this visit.   Subjective Assessment - 04/26/20 1638    Subjective  S: I'm not going to be able to do much today. My arm is still sore and swollen from the booster shot.    Currently in Pain? Yes    Pain Score 7     Pain Location Shoulder    Pain Orientation Left    Pain Descriptors / Indicators Sore    Pain Type Acute pain    Pain Radiating Towards N/A    Pain Onset In the past 7 days    Pain  Frequency Constant    Aggravating Factors  Booster shot    Pain Relieving Factors over the counter pain medication    Effect of Pain on Daily Activities max effect    Multiple Pain Sites No              [facility] OT Assessment - 04/26/20 1649      Assessment   Medical Diagnosis left shoulder pain      Precautions   Precautions None                    OT Treatments/Exercises (OP) - 04/26/20 1702      Exercises   Exercises Shoulder      Shoulder Exercises: Supine   Protraction AROM;10 reps    Horizontal ABduction AROM;10 reps      Shoulder Exercises: Seated   Flexion AROM;5 reps      Modalities   Modalities Moist Heat;Electrical Stimulation      Moist Heat Therapy   Number Minutes Moist Heat 10 Minutes    Moist Heat Location Shoulder      Electrical Stimulation   Electrical Stimulation Location left shoulder    Electrical Stimulation Action interferential    Electrical Stimulation Parameters 8.8 CV    Electrical Stimulation Goals Pain                  OT Education - 04/26/20 1729    Education Details Pt educated to stop previous HEP exercises due to pain in left arm. Provided A/ROM seated shoulder flexion, protraction, and row.    Person(s) Educated Patient    Methods Explanation;Demonstration;Handout    Comprehension Verbalized understanding;Returned demonstration            OT Short Term Goals - 04/12/20 1306      OT SHORT TERM GOAL #1   Title Pt will be provided with and educated on HEP to improve LUE strength and activity tolerance required for ADL completion.    Time 4    Period Weeks    Status On-going    Target Date 05/07/20      OT SHORT TERM GOAL #2   Title Pt will decrease pain in LUE to 3/10 or less to improve ability to sleep for 4+ hours at night without waking due to pain.    Time 4    Period Weeks    Status On-going      OT SHORT TERM GOAL #3   Title Pt will decrease LUE fascial restrictions to minimal amounts or  less  to improve mobility required for functional reaching tasks.    Time 4    Period Weeks    Status On-going      OT SHORT TERM GOAL #4   Title Pt will increase LUE activity tolerance to improve ability to complete tasks requiring sustained use of the LUE with minimal breaks, such as cooking.    Time 4    Period Weeks    Status On-going                    Plan - 04/26/20 1730    Clinical Impression Statement A: Patient presented with increased shoulder pain due to booster shot on Saturday. Pain management techniques completed during session with patient reporting a decrease of pain level to 5/10. Education provided for HEP modification with exercises provided. VC for form and technique were provided.    Body Structure / Function / Physical Skills ADL;Muscle spasms;UE functional use;Fascial restriction;Pain;ROM;Strength;IADL    Plan P: Reassess and determine if additional therapy is needed. Follow up on pain from booster shot.    OT Home Exercise Plan eval: red scapular theraband 11/16: Stop scapular strengthening. A/ROM shoulder protraction, flexion, and row    Consulted and Agree with Plan of Care Patient           Patient will benefit from skilled therapeutic intervention in order to improve the following deficits and impairments:   Body Structure / Function / Physical Skills: ADL, Muscle spasms, UE functional use, Fascial restriction, Pain, ROM, Strength, IADL       Visit Diagnosis: Chronic left shoulder pain  Cervicalgia  Other symptoms and signs involving the musculoskeletal system    Problem List Patient Active Problem List   Diagnosis Date Noted  . Iron deficiency anemia 02/10/2020  . Flank pain 01/11/2020  . Pelvic pressure in female 11/17/2019  . Adenomatous polyps   . Cervical spondylosis 07/13/2019  . Cervical spondylosis with radiculopathy 07/13/2019  . Muscle cramps 07/01/2019  . Depression, major, single episode, in partial remission ([place])  03/13/2019  . Annual physical exam 03/12/2019  . Eyelid cyst, right 01/15/2019  . 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 Shoulder pain, left 02/28/2018  . Palpable mass of neck 02/28/2018  . Insomnia 11/09/2017  . Multiple myeloma ([place]) 03/15/2017  . Urinary frequency 02/28/2017  . 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 CKD (chronic kidney disease) stage 3, GFR 30-59 ml/min ([facility]) 07/12/2015  . Anemia 10/14/2014  . Lumbar back pain with radiculopathy affecting left lower extremity 05/11/2014  . Obesity (BMI 30.0-34.9) 01/02/2014  . Generalized anxiety disorder 05/20/2013  . Anxiety and depression 08/13/2012  . Thoracic or lumbosacral neuritis or radiculitis, unspecified 12/22/2010  . Abdominal bloating 09/08/2009  . Colon adenomas 05/11/2009  . GERD 03/15/2009  . FATTY LIVER DISEASE 03/15/2009  . Sleep apnea 09/15/2008  . 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 Hyperlipidemia LDL goal <100 09/17/2007  . Hypertension goal BP (blood pressure) < 130/80 09/17/2007   Ailene Ravel, OTR/L,CBIS  [phone]  04/26/2020, 6:01 PM  [place], [place], [address] Phone: [phone]   Fax:  [phone]  Name: GRACIANNA VINK MRN: [id number] Date of Birth: 01-21-1946

## 2020-04-26 NOTE — Progress Notes (Signed)
H&P  Chief Complaint: Kidney Stones  History of Present Illness:   11.16.2021: Pt denies any dysuria or gross hematuria following recent procedure and notes minimal issue with removing her stent. She reports experiencing nocturia and a recent episode of urinary incontinence. Recent renal U/S--mild lt hydro--bilat renal calculi  (below copied from [facility] records):  7.13.2021: Cheryl Abbott is a 74 y.o. year old female last seen in 2017 after treatment for ureteral stricture/hydronephrosis on the right.  She has had right greater than left abdominal pain over the past month.  No gross hematuria but she does have nausea with this.  No recent treatment for urinary tract infections.  CT scan was done on July 8 revealing a 10 x 6 mm left upper ureteral calculus.  She is here today for further management.  Past Medical History:  Diagnosis Date  . Abnormal brain scan 02/07/2012  . Anemia   . Anxiety   . Anxiety disorder   . Arthritis   . Brain TIA 08/19/2015  . Cancer ([place])    acute myeloma  . 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 Chronic back pain   . Depression   . Fatty liver   . GERD (gastroesophageal reflux disease)   . Headache   . HEMORRHOIDS 03/15/2009   OCT 2014 HB 12. 9    . History of adenomatous polyp of colon    2008  . History of kidney stones   . Hypercalcemia   . Hypercalcemia 11/30/2010  . Hyperlipidemia   . Hypertension   . Lactose intolerance in adult 02/01/2016  . Left ureteral stone   . Lipoma of arm 01/19/2015  . Lumbar disc disease with radiculopathy   . Nephrolithiasis   . Neuropathic pain of both feet 04/11/2016  . Neuropathy, peripheral   . Nocturnal hypoxia 02/04/2014   Study confirms this done 02/10/2014   . OSA treated with BiPAP    per study 2007  . RENAL CALCULUS 03/15/2009   Qualifier: Diagnosis of  By: Zeb Comfort    . Sciatica   . Shoulder pain, bilateral   . Sigmoid diverticulosis   . TIA (transient ischemic attack) 02/26/2016  .  Type 2 diabetes mellitus ([place])   . Vaginal dryness, menopausal 10/14/2016    Past Surgical History:  Procedure Laterality Date  . BIOPSY N/A 03/17/2013   Procedure: GASTRIC BIOPSIES;  Surgeon: Danie Binder, MD;  Location: [facility];  Service: Endoscopy;  Laterality: N/A;  . BIOPSY  06/10/2015   Procedure: BIOPSY;  Surgeon: Danie Binder, MD;  [facility];  Service: Endoscopy;;  gastric biopsy  . CARDIAC CATHETERIZATION  05-11-2003  dr Shelva Majestic ([facility])   Abnormal cardiolite/   Normal coronary arteries and normal LVF,  ef  63%  . CARDIOVASCULAR STRESS TEST  01-01-2014   normal lexiscan cardiolite/  no ischemia/ infarct/  normal LV function and wall motion ,  ef 81%  . COLONOSCOPY  last one 10/02/2011   MOD Valatie TICS, IH, NEXT TCS APR 2018  . COLONOSCOPY WITH PROPOFOL N/A 02/26/2017   Procedure: COLONOSCOPY WITH PROPOFOL;  Surgeon: Danie Binder, MD;  [facility];  Service: Endoscopy;  Laterality: N/A;  11:00am  . COLONOSCOPY WITH PROPOFOL N/A 09/22/2019   Procedure: COLONOSCOPY WITH PROPOFOL;  Surgeon: Danie Binder, MD;  [facility];  Service: Endoscopy;  Laterality: N/A;  1:15  . CYSTO/   RIGHT URETEROSOCOPY LASER LITHOTRIPSY STONE EXTRACTION  10-13-2004  . CYSTO/  RIGHT RETROGRADE PYELOGRAM/  PLACMENT RIGHT URETERAL STENT  01-10-2010  . CYSTOSCOPY W/ URETERAL STENT PLACEMENT Right 08/19/2015   Procedure: CYSTOSCOPY WITH RETROGRADE PYELOGRAM/URETERAL STENT PLACEMENT;  Surgeon: Franchot Gallo, MD;  Location: [facility];  Service: Urology;  Laterality: Right;  . CYSTOSCOPY W/ URETERAL STENT PLACEMENT Left 02/23/2020   Procedure: CYSTOSCOPY LEFT  RETROGRADE PYELOGRAM LEFT URETEROSCOPY URETERAL STENT PLACEMENT;  Surgeon: Franchot Gallo, MD;  Location: [facility];  Service: Urology;  Laterality: Left;  . CYSTOSCOPY WITH RETROGRADE PYELOGRAM, URETEROSCOPY AND STENT PLACEMENT Left 10/21/2014   Procedure: CYSTOSCOPY WITH RETROGRADE PYELOGRAM,  URETEROSCOPY AND STENT PLACEMENT;  Surgeon: Franchot Gallo, MD;  Location: [facility];  Service: Urology;  Laterality: Left;  . CYSTOSCOPY WITH RETROGRADE PYELOGRAM, URETEROSCOPY AND STENT PLACEMENT Right 11/22/2015   Procedure: CYSTOSCOPY, RIGHT URETERAL STENT REMOVAL; RIGHT RETROGRADE PYELOGRAM, RIGHT URETEROSCOPY WITH BALLOON DILATION; RIGHT URETERAL STENT PLACEMENT;  Surgeon: Franchot Gallo, MD;  Location: [facility];  Service: Urology;  Laterality: Right;  . CYSTOSCOPY/RETROGRADE/URETEROSCOPY/STONE EXTRACTION WITH BASKET Bilateral 08/02/2015   Procedure: CYSTOSCOPY; BILATERAL RETROGRADE PYELOGRAMS; BILATERAL URETEROSCOPY, STONE EXTRACTION WITH BASKET;  Surgeon: Franchot Gallo, MD;  [facility]: [facility];  Service: Urology;  Laterality: Bilateral;  . CYSTOSCOPY/RETROGRADE/URETEROSCOPY/STONE EXTRACTION WITH BASKET Right 06/28/2015   Procedure: CYSTOSCOPY/RETROGRADE/URETEROSCOPY/STONE EXTRACTION WITH BASKET, RIGHT URETERAL DOUBLE J STENT PLACEMENT;  Surgeon: Franchot Gallo, MD;  [facility];  Service: Urology;  Laterality: Right;  . ESOPHAGOGASTRODUODENOSCOPY (EGD) WITH PROPOFOL N/A 03/17/2013   Procedure: ESOPHAGOGASTRODUODENOSCOPY (EGD) WITH PROPOFOL;  Surgeon: Danie Binder, MD;  [facility]: [facility];  Service: Endoscopy;  Laterality: N/A;  . ESOPHAGOGASTRODUODENOSCOPY (EGD) WITH PROPOFOL N/A 06/10/2015   Distal gastritis, distal esophageal stricture s/p dilation  . ESOPHAGOGASTRODUODENOSCOPY (EGD) WITH PROPOFOL N/A 02/26/2017   Procedure: ESOPHAGOGASTRODUODENOSCOPY (EGD) WITH PROPOFOL;  Surgeon: Danie Binder, MD;  [facility];  Service: Endoscopy;  Laterality: N/A;  . FLEXIBLE SIGMOIDOSCOPY  09/11/2011   IZT:[id number] Internal hemorrhoids  . HOLMIUM LASER APPLICATION Left 3/38/2505   Procedure: HOLMIUM LASER APPLICATION;  Surgeon: Franchot Gallo, MD;  Location: [facility];  Service: Urology;  Laterality: Left;  . HOLMIUM LASER APPLICATION Right  3/97/6734   Procedure: HOLMIUM LASER APPLICATION;  Surgeon: Franchot Gallo, MD;  Location: [facility];  Service: Urology;  Laterality: Right;  . HOLMIUM LASER APPLICATION  1/93/7902   Procedure: HOLMIUM LASER APPLICATION;  Surgeon: Franchot Gallo, MD;  Location: [facility];  Service: Urology;;  . MASS EXCISION Left 03/04/2015   Procedure: EXCISION OF SOFT TISSUE NEOPLASM LEFT ARM;  Surgeon: Aviva Signs, MD;  Location: [facility];  Service: General;  Laterality: Left;  . PERCUTANEOUS NEPHROSTOLITHOTOMY Bilateral 12/  2011     [facility]  . POLYPECTOMY N/A 03/17/2013   Procedure: GASTRIC POLYPECTOMY;  Surgeon: Danie Binder, MD;  Location: [facility];  Service: Endoscopy;  Laterality: N/A;  . POLYPECTOMY  02/26/2017   Procedure: POLYPECTOMY;  Surgeon: Danie Binder, MD;  [facility];  Service: Endoscopy;;  polyp at cecum, ascending colon polyps x3, hepatic flexure polyps x8, transverse colon polyps x8   . POLYPECTOMY  09/22/2019   Procedure: POLYPECTOMY;  Surgeon: Danie Binder, MD;  [facility];  Service: Endoscopy;;  . REMOVAL RIGHT THIGH CYST  2006  . SAVORY DILATION N/A 03/17/2013   Procedure: SAVORY DILATION;  Surgeon: Danie Binder, MD;  Location: [facility];  Service: Endoscopy;  Laterality: N/A;  #12.8, 14, 15, 16 dilators used  . SAVORY DILATION N/A 06/10/2015   Procedure: SAVORY  DILATION;  Surgeon: Danie Binder, MD;  [facility];  Service: Endoscopy;  Laterality: N/A;  . TRANSTHORACIC ECHOCARDIOGRAM  01-01-2014   mild LVH/  ef 70-62%/  grade I diastolic dysfunction/  trivial MR, TR, and PR  . VAGINAL HYSTERECTOMY  1970's    Home Medications:  Allergies as of 04/26/2020      Reactions   Ace Inhibitors Cough   Keflex [cephalexin] Diarrhea, Nausea And Vomiting   Nitrofurantoin Diarrhea, Nausea And Vomiting   Penicillins Hives, Other (See Comments)   Reaction:  Blisters on hands/feet  Has patient had a PCN reaction causing immediate rash,  facial/tongue/throat swelling, SOB or lightheadedness with hypotension: No Has patient had a PCN reaction causing severe rash involving mucus membranes or skin necrosis: No Has patient had a PCN reaction that required hospitalization No Has patient had a PCN reaction occurring within the last 10 years: No If all of the above answers are "NO", then may proceed with Cephalosporin use.      Medication List       Accurate as of April 26, 2020  1:16 PM. If you have any questions, ask your nurse or doctor.        acetaminophen 500 MG tablet Commonly known as: TYLENOL Take 1,000 mg by mouth every 6 (six) hours as needed for moderate pain.   amLODipine 10 MG tablet Commonly known as: NORVASC Take 10 mg by mouth daily.   aspirin EC 81 MG tablet Take 81 mg by mouth daily.   budesonide-formoterol 80-4.5 MCG/ACT inhaler Commonly known as: Symbicort INHALE 2 PUFFS INTO THE LUNGS TWICE A DAY What changed:   how much to take  how to take this  when to take this  additional instructions   diclofenac Sodium 1 % Gel Commonly known as: Voltaren Apply 4 g topically 4 (four) times daily.   diphenhydramine-acetaminophen 25-500 MG Tabs tablet Commonly known as: TYLENOL PM Take 2 tablets by mouth at bedtime.   DSS 100 MG Caps Take by mouth.   gabapentin 100 MG capsule Commonly known as: NEURONTIN Take one to two capsules art bedtime for nerve pain What changed: how much to take   GaviLyte-G 236 g solution Generic drug: polyethylene glycol USE AS DIRECTED.O   iron polysaccharides 150 MG capsule Commonly known as: NIFEREX Take by mouth.   linagliptin 5 MG Tabs tablet Commonly known as: Tradjenta Take 1 tablet (5 mg total) by mouth daily.   magnesium citrate Soln Take 0.5 Bottles by mouth every 3 (three) days.   methocarbamol 500 MG tablet Commonly known as: ROBAXIN Take 1 tablet (500 mg total) by mouth 3 (three) times daily.   OneTouch Delica Lancets 37S Misc Once  daily testing dx e11.9   OneTouch Ultra test strip Generic drug: glucose blood Use as instructed once daily dx e11.9   pantoprazole 40 MG tablet Commonly known as: PROTONIX TAKE 1 TABLET (40 MG TOTAL) BY MOUTH 2 (TWO) TIMES DAILY.   PARoxetine 40 MG tablet Commonly known as: PAXIL TAKE 1 TABLET BY MOUTH EVERY DAY IN THE MORNING   rosuvastatin 20 MG tablet Commonly known as: Crestor Take 1 tablet (20 mg total) by mouth daily.   traMADol 50 MG tablet Commonly known as: ULTRAM Take 50 mg by mouth every 6 (six) hours as needed for moderate pain.   Vitamin D3 50 MCG (2000 UT) Tabs Take 2,000 Units by mouth daily.       Allergies:  Allergies  Allergen Reactions  .  Ace Inhibitors Cough  . Keflex [Cephalexin] Diarrhea and Nausea And Vomiting  . Nitrofurantoin Diarrhea and Nausea And Vomiting  . Penicillins Hives and Other (See Comments)    Reaction:  Blisters on hands/feet  Has patient had a PCN reaction causing immediate rash, facial/tongue/throat swelling, SOB or lightheadedness with hypotension: No Has patient had a PCN reaction causing severe rash involving mucus membranes or skin necrosis: No Has patient had a PCN reaction that required hospitalization No Has patient had a PCN reaction occurring within the last 10 years: No If all of the above answers are "NO", then may proceed with Cephalosporin use.    Family History  Problem Relation Age of Onset  . Hypertension Mother   . Diabetes Mother   . Heart failure Mother   . Dementia Mother   . Emphysema Father   . Hypertension Father   . Diabetes Brother   . GER disease Brother   . Hypertension Sister   . Hypertension Sister   . Cancer Other        family history   . Diabetes Other        family history   . Heart defect Other        famiily history   . Arthritis Other        family history   . Anesthesia problems Neg Hx   . Hypotension Neg Hx   . Malignant hyperthermia Neg Hx   . Pseudochol deficiency Neg Hx    . Colon cancer Neg Hx     Social History:  reports that she quit smoking about 31 years ago. Her smoking use included cigarettes. She started smoking about 45 years ago. She has a 20.00 pack-year smoking history. She has never used smokeless tobacco. She reports that she does not drink alcohol and does not use drugs.  ROS: A complete review of systems was performed.  All systems are negative except for pertinent findings as noted.  Physical Exam:  Vital signs in last 24 hours: There were no vitals taken for this visit. Constitutional:  Alert and oriented, No acute distress Cardiovascular: Regular rate  Respiratory: Normal respiratory effort GI: Abdomen is soft, nontender, nondistended, no abdominal masses. No CVAT.  Genitourinary: Normal female phallus, testes are descended bilaterally and non-tender and without masses, scrotum is normal in appearance without lesions or masses, perineum is normal on inspection. Lymphatic: No lymphadenopathy Neurologic: Grossly intact, no focal deficits Psychiatric: Normal mood and affect  I have reviewed prior pt notes  I have reviewed notes from referring/previous physicians  I have reviewed urinalysis results  I have independently reviewed prior imaging--renal U/S    Impression/Assessment:  1. Kidney stones - Pt stable   Plan:  1. Pt advised regarding dietary modifications to reduce the formation of kidney stones - increase water intake, decrease sodium intake.  2. F/U in 4 months for OV and KUB.  CC: Dr. Tula Nakayama

## 2020-04-26 NOTE — Patient Instructions (Signed)
Repeat all exercises 10 times, 1-2 times per day.  1) Shoulder Protraction    Begin with elbows by your side, slowly "punch" straight out in front of you.      2) Shoulder Flexion  Supine:     Standing:         Begin with arms at your side with thumbs pointed up, slowly raise both arms up and forward towards overhead.    1) Seated Row   Sit up straight with elbows by your sides. Pull back with shoulders/elbows, keeping forearms straight, as if pulling back on the reins of a horse. Squeeze shoulder blades together. Repeat _10__times, __2__sets/day

## 2020-04-26 NOTE — Progress Notes (Signed)
Urological Symptom Review  Patient is experiencing the following symptoms: Get up at night to urinate Leakage of urine Trouble starting stream Weak stream   Review of Systems  Gastrointestinal (upper)  : Indigestion/heartburn  Gastrointestinal (lower) : Constipation  Constitutional : Night Sweats  Skin: Negative for skin symptoms  Eyes: Negative for eye symptoms  Ear/Nose/Throat : Negative for Ear/Nose/Throat symptoms  Hematologic/Lymphatic: Negative for Hematologic/Lymphatic symptoms  Cardiovascular : Leg swelling Chest pain  Respiratory : Shortness of breath  Endocrine: Negative for endocrine symptoms  Musculoskeletal: Back pain  Neurological: Negative for neurological symptoms  Psychologic: Anxiety

## 2020-04-27 ENCOUNTER — Telehealth: Payer: Self-pay

## 2020-04-27 NOTE — Telephone Encounter (Signed)
Patient called and stated her name and a phone number but did not leave a reason for her call. Return the call, no answer, lvm asking for patient to call back.

## 2020-04-29 NOTE — Telephone Encounter (Signed)
Spoke with patient. She stated the reason she called was because she had lost her one of her medications and was calling to see if she could get another script for her. I explained that insurance wont pay for an early refill because she lost it. She said she had found it. That is why she didn't call back.

## 2020-05-03 ENCOUNTER — Encounter (HOSPITAL_COMMUNITY): Payer: Self-pay

## 2020-05-03 ENCOUNTER — Ambulatory Visit (HOSPITAL_COMMUNITY): Payer: Medicare Other

## 2020-05-03 ENCOUNTER — Other Ambulatory Visit: Payer: Self-pay

## 2020-05-03 DIAGNOSIS — M542 Cervicalgia: Secondary | ICD-10-CM | POA: Diagnosis not present

## 2020-05-03 DIAGNOSIS — G8929 Other chronic pain: Secondary | ICD-10-CM

## 2020-05-03 DIAGNOSIS — R29898 Other symptoms and signs involving the musculoskeletal system: Secondary | ICD-10-CM

## 2020-05-03 DIAGNOSIS — M25512 Pain in left shoulder: Secondary | ICD-10-CM | POA: Diagnosis not present

## 2020-05-03 NOTE — Therapy (Signed)
[facility] [address] [place], [place], [address] Phone: [phone]   Fax:  [phone]  Occupational Therapy Treatment Reassess/discharge summary Patient Details  Name: Cheryl Abbott MRN: [id number] Date of Birth: 01-Nov-1945 Referring Provider (OT): Dr. Arther Abbott    AROM  Overall AROM Comments Assessed seated, er/IR adducted   AROM Assessment Site Shoulder   Left Shoulder Flexion 150 Degrees   previous: 170  Left Shoulder ABduction 155 Degrees   previous: 165  Left Shoulder Internal Rotation 90 Degrees   previous: same  Left Shoulder External Rotation 62 Degrees   previous: 55  Cervical Flexion 40   WFL   Cervical Extension 60   previous: 45  Cervical - Right Side Bend 40   previous: 45  Cervical - Left Side Bend 45   WFL  Cervical - Right Rotation 47   previous: 78  Cervical - Left Rotation 45   previous: 62    Strength  Overall Strength Comments Assessed seated, er/IR adducted   Strength Assessment Site Shoulder   Left Shoulder Flexion 5/5   previous: same  Left Shoulder ABduction 5/5   previous: same  Left Shoulder Internal Rotation 5/5   previous: same  Left Shoulder External Rotation 5/5   previous: 4+/5    Encounter Date: 05/03/2020   OT End of Session - 05/03/20 1555    Visit Number 4    Number of Visits 5    Authorization Type [employer] Medicare    Progress Note Due on Visit 10    OT Start Time 1430    OT Stop Time 1525    OT Time Calculation (min) 55 min    Activity Tolerance Patient limited by pain    Behavior During Therapy Flat affect           Past Medical History:  Diagnosis Date  . Abnormal brain scan 02/07/2012  . Anemia   . Anxiety   . Anxiety disorder   . Arthritis   . Brain TIA 08/19/2015  . Cancer ([place])    acute myeloma  . 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 Chronic back pain   . Depression   . Fatty liver   . GERD (gastroesophageal reflux disease)   . Headache   .  HEMORRHOIDS 03/15/2009   OCT 2014 HB 12. 9    . History of adenomatous polyp of colon    2008  . History of kidney stones   . Hypercalcemia   . Hypercalcemia 11/30/2010  . Hyperlipidemia   . Hypertension   . Lactose intolerance in adult 02/01/2016  . Left ureteral stone   . Lipoma of arm 01/19/2015  . Lumbar disc disease with radiculopathy   . Nephrolithiasis   . Neuropathic pain of both feet 04/11/2016  . Neuropathy, peripheral   . Nocturnal hypoxia 02/04/2014   Study confirms this done 02/10/2014   . OSA treated with BiPAP    per study 2007  . RENAL CALCULUS 03/15/2009   Qualifier: Diagnosis of  By: Zeb Comfort    . Sciatica   . Shoulder pain, bilateral   . Sigmoid diverticulosis   . TIA (transient ischemic attack) 02/26/2016  . Type 2 diabetes mellitus ([place])   . Vaginal dryness, menopausal 10/14/2016    Past Surgical History:  Procedure Laterality Date  . BIOPSY N/A 03/17/2013   Procedure: GASTRIC BIOPSIES;  Surgeon: Danie Binder, MD;  Location: [facility];  Service: Endoscopy;  Laterality: N/A;  . BIOPSY  06/10/2015   Procedure: BIOPSY;  Surgeon: Danie Binder, MD;  [facility];  Service: Endoscopy;;  gastric biopsy  . CARDIAC CATHETERIZATION  05-11-2003  dr Shelva Majestic ([facility])   Abnormal cardiolite/   Normal coronary arteries and normal LVF,  ef  63%  . CARDIOVASCULAR STRESS TEST  01-01-2014   normal lexiscan cardiolite/  no ischemia/ infarct/  normal LV function and wall motion ,  ef 81%  . COLONOSCOPY  last one 10/02/2011   MOD [place] [facility], IH, NEXT TCS APR 2018  . COLONOSCOPY WITH PROPOFOL N/A 02/26/2017   Procedure: COLONOSCOPY WITH PROPOFOL;  Surgeon: Danie Binder, MD;  [facility];  Service: Endoscopy;  Laterality: N/A;  11:00am  . COLONOSCOPY WITH PROPOFOL N/A 09/22/2019   Procedure: COLONOSCOPY WITH PROPOFOL;  Surgeon: Danie Binder, MD;  [facility];  Service: Endoscopy;  Laterality: N/A;  1:15  . CYSTO/   RIGHT  URETEROSOCOPY LASER LITHOTRIPSY STONE EXTRACTION  10-13-2004  . CYSTO/  RIGHT RETROGRADE PYELOGRAM/  PLACMENT RIGHT URETERAL STENT  01-10-2010  . CYSTOSCOPY W/ URETERAL STENT PLACEMENT Right 08/19/2015   Procedure: CYSTOSCOPY WITH RETROGRADE PYELOGRAM/URETERAL STENT PLACEMENT;  Surgeon: Franchot Gallo, MD;  Location: [facility];  Service: Urology;  Laterality: Right;  . CYSTOSCOPY W/ URETERAL STENT PLACEMENT Left 02/23/2020   Procedure: CYSTOSCOPY LEFT  RETROGRADE PYELOGRAM LEFT URETEROSCOPY URETERAL STENT PLACEMENT;  Surgeon: Franchot Gallo, MD;  Location: [facility];  Service: Urology;  Laterality: Left;  . CYSTOSCOPY WITH RETROGRADE PYELOGRAM, URETEROSCOPY AND STENT PLACEMENT Left 10/21/2014   Procedure: CYSTOSCOPY WITH RETROGRADE PYELOGRAM, URETEROSCOPY AND STENT PLACEMENT;  Surgeon: Franchot Gallo, MD;  Location[facility];  Service: Urology;  Laterality: Left;  . CYSTOSCOPY WITH RETROGRADE PYELOGRAM, URETEROSCOPY AND STENT PLACEMENT Right 11/22/2015   Procedure: CYSTOSCOPY, RIGHT URETERAL STENT REMOVAL; RIGHT RETROGRADE PYELOGRAM, RIGHT URETEROSCOPY WITH BALLOON DILATION; RIGHT URETERAL STENT PLACEMENT;  Surgeon: Franchot Gallo, MD;  Location: [facility];  Service: Urology;  Laterality: Right;  . CYSTOSCOPY/RETROGRADE/URETEROSCOPY/STONE EXTRACTION WITH BASKET Bilateral 08/02/2015   Procedure: CYSTOSCOPY; BILATERAL RETROGRADE PYELOGRAMS; BILATERAL URETEROSCOPY, STONE EXTRACTION WITH BASKET;  Surgeon: Franchot Gallo, MD;  Location: [facility];  Service: Urology;  Laterality: Bilateral;  . CYSTOSCOPY/RETROGRADE/URETEROSCOPY/STONE EXTRACTION WITH BASKET Right 06/28/2015   Procedure: CYSTOSCOPY/RETROGRADE/URETEROSCOPY/STONE EXTRACTION WITH BASKET, RIGHT URETERAL DOUBLE J STENT PLACEMENT;  Surgeon: Franchot Gallo, MD;  [facility]: [facility];  Service: Urology;  Laterality: Right;  . ESOPHAGOGASTRODUODENOSCOPY (EGD) WITH PROPOFOL N/A 03/17/2013   Procedure: ESOPHAGOGASTRODUODENOSCOPY (EGD) WITH  PROPOFOL;  Surgeon: Danie Binder, MD;  Location: [facility];  Service: Endoscopy;  Laterality: N/A;  . ESOPHAGOGASTRODUODENOSCOPY (EGD) WITH PROPOFOL N/A 06/10/2015   Distal gastritis, distal esophageal stricture s/p dilation  . ESOPHAGOGASTRODUODENOSCOPY (EGD) WITH PROPOFOL N/A 02/26/2017   Procedure: ESOPHAGOGASTRODUODENOSCOPY (EGD) WITH PROPOFOL;  Surgeon: Danie Binder, MD;  [facility];  Service: Endoscopy;  Laterality: N/A;  . FLEXIBLE SIGMOIDOSCOPY  09/11/2011   [id number] Internal hemorrhoids  . HOLMIUM LASER APPLICATION Left 9/81/1914   Procedure: HOLMIUM LASER APPLICATION;  Surgeon: Franchot Gallo, MD;  Location: [facility];  Service: Urology;  Laterality: Left;  . HOLMIUM LASER APPLICATION Right 7/82/9562   Procedure: HOLMIUM LASER APPLICATION;  Surgeon: Franchot Gallo, MD;  Location: [facility];  Service: Urology;  Laterality: Right;  . HOLMIUM LASER APPLICATION  07/11/8655   Procedure: HOLMIUM LASER APPLICATION;  Surgeon: Franchot Gallo, MD;  Location: [facility];  Service: Urology;;  . MASS EXCISION Left 03/04/2015   Procedure:  EXCISION OF SOFT TISSUE NEOPLASM LEFT ARM;  Surgeon: Aviva Signs, MD;  Location: [facility];  Service: General;  Laterality: Left;  . PERCUTANEOUS NEPHROSTOLITHOTOMY Bilateral 12/  2011     [facility]  . POLYPECTOMY N/A 03/17/2013   Procedure: GASTRIC POLYPECTOMY;  Surgeon: Danie Binder, MD;  Location: [facility];  Service: Endoscopy;  Laterality: N/A;  . POLYPECTOMY  02/26/2017   Procedure: POLYPECTOMY;  Surgeon: Danie Binder, MD;  [facility];  Service: Endoscopy;;  polyp at cecum, ascending colon polyps x3, hepatic flexure polyps x8, transverse colon polyps x8   . POLYPECTOMY  09/22/2019   Procedure: POLYPECTOMY;  Surgeon: Danie Binder, MD;  [facility];  Service: Endoscopy;;  . REMOVAL RIGHT THIGH CYST  2006  . SAVORY DILATION N/A 03/17/2013   Procedure: SAVORY DILATION;  Surgeon: Danie Binder, MD;   Location: [facility];  Service: Endoscopy;  Laterality: N/A;  #12.8, 14, 15, 16 dilators used  . SAVORY DILATION N/A 06/10/2015   Procedure: SAVORY DILATION;  Surgeon: Danie Binder, MD;  [facility];  Service: Endoscopy;  Laterality: N/A;  . TRANSTHORACIC ECHOCARDIOGRAM  01-01-2014   mild LVH/  ef 25-95%/  grade I diastolic dysfunction/  trivial MR, TR, and PR  . VAGINAL HYSTERECTOMY  1970's    There were no vitals filed for this visit.   Subjective Assessment - 05/03/20 1435    Subjective  S: My neck is really bothering me today. It's been like this for the past two days. I'm not sure why.    Currently in Pain? Yes    Pain Score 5    Right shoulder 7-8/10   Pain Location Shoulder    Pain Orientation Left    Pain Descriptors / Indicators Tightness    Pain Type Acute pain    Pain Radiating Towards N/A    Pain Onset 1 to 4 weeks ago    Pain Frequency Constant    Aggravating Factors  nothing    Pain Relieving Factors over the counter pain medication    Effect of Pain on Daily Activities max effect due to right side pain.              [facility] OT Assessment - 05/03/20 1446      Assessment   Medical Diagnosis left shoulder pain      Precautions   Precautions None      Observation/Other Assessments   Focus on Therapeutic Outcomes (FOTO)  54/100      ROM / Strength   AROM / PROM / Strength AROM;Strength      Palpation   Palpation comment mod/max fascial restrictions in left trapezius and scapular regions      AROM   Overall AROM Comments Assessed seated, er/IR adducted    AROM Assessment Site Shoulder    Left Shoulder Flexion 150 Degrees   previous: 170   Left Shoulder ABduction 155 Degrees   previous: 165   Left Shoulder Internal Rotation 90 Degrees   previous: same   Left Shoulder External Rotation 62 Degrees   previous: 55   Cervical Flexion 40   WFL    Cervical Extension 60   previous: 45   Cervical - Right Side Bend 40   previous: 45   Cervical - Left  Side Bend 45   WFL   Cervical - Right Rotation 47   previous: 78   Cervical - Left Rotation 45   previous: 97  Strength   Overall Strength Comments Assessed seated, er/IR adducted    Strength Assessment Site Shoulder    Left Shoulder Flexion 5/5   previous: same   Left Shoulder ABduction 5/5   previous: same   Left Shoulder Internal Rotation 5/5   previous: same   Left Shoulder External Rotation 5/5   previous: 4+/5                           OT Education - 05/03/20 1551    Education Details Discussed lack of progress/regression since initial evaluation. Discussed medical issues that may be factors in progressing therapy. Discussed focusing on pain management techniques and encouraged her to discuss possible referral to a pain management clinic. Provided a list of local clinics. HEP was revised and now includes cerivcal rotation, A/ROM shoulder flexion and protraction. Education provided on activity modification especially when handling the large roaster pain in and out of the oven. Discussed the importance of mental health and how it can be effected with dealing with pain long term. Encouraged her to discuss further with her MD if she feels she needs to adjust her medication for anxiety, etc.    Person(s) Educated Patient    Methods Explanation;Handout    Comprehension Verbalized understanding            OT Short Term Goals - 05/03/20 1459      OT SHORT TERM GOAL #1   Title Pt will be provided with and educated on HEP to improve LUE strength and activity tolerance required for ADL completion.    Time 4    Period Weeks    Status Not Met    Target Date 05/07/20      OT SHORT TERM GOAL #2   Title Pt will decrease pain in LUE to 3/10 or less to improve ability to sleep for 4+ hours at night without waking due to pain.    Time 4    Period Weeks    Status Not Met      OT SHORT TERM GOAL #3   Title Pt will decrease LUE fascial restrictions to minimal amounts or  less to improve mobility required for functional reaching tasks.    Time 4    Period Weeks    Status Not Met      OT SHORT TERM GOAL #4   Title Pt will increase LUE activity tolerance to improve ability to complete tasks requiring sustained use of the LUE with minimal breaks, such as cooking.    Time 4    Period Weeks    Status Not Met                    Plan - 05/03/20 1555    Clinical Impression Statement A: Reassessment due this date. patient has not met any goals. She has regressed overall for both left shoulder and neck region. Shoulder flexion and abduction have decreased. External rotation is improved. Strength has remained the same and is functional although activity tolerance and shoulder stability remains poor. Pt reports recent difficulty with cervical rotation right and left with unknown cause. Pt expresses only completing her HEP one time since evaluation due to not being to tolerate. Discussed that the goal for her HEP was just to move her arm and she had been instructed to modify as needed to be able to complete. Patient reports major deficit is pain which is limiting her ability to participate and tolerate  therapy at this time. Discussed in length that therapy may not be the right path at this time and she should focus on pain management. Informed patient of pain management clinics which may be an option and encouraged her to discuss with Dr. Aline Brochure. Pt instructed to schedule a follow up appointment with Dr. Aline Brochure and discuss. Pt verbalized understanding.    Body Structure / Function / Physical Skills ADL;Muscle spasms;UE functional use;Fascial restriction;Pain;ROM;Strength;IADL    Plan P: D/C from therapy services with HEP. Follow up with Dr. Aline Brochure to discuss pain management options such as pain management clinics.    Consulted and Agree with Plan of Care Patient           Patient will benefit from skilled therapeutic intervention in order to improve the  following deficits and impairments:   Body Structure / Function / Physical Skills: ADL, Muscle spasms, UE functional use, Fascial restriction, Pain, ROM, Strength, IADL       Visit Diagnosis: Cervicalgia  Chronic left shoulder pain  Other symptoms and signs involving the musculoskeletal system    Problem List Patient Active Problem List   Diagnosis Date Noted  . Iron deficiency anemia 02/10/2020  . Flank pain 01/11/2020  . Pelvic pressure in female 11/17/2019  . Adenomatous polyps   . Cervical spondylosis 07/13/2019  . Cervical spondylosis with radiculopathy 07/13/2019  . Muscle cramps 07/01/2019  . Depression, major, single episode, in partial remission ([place]) 03/13/2019  . Annual physical exam 03/12/2019  . Eyelid cyst, right 01/15/2019  . 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 Shoulder pain, left 02/28/2018  . Palpable mass of neck 02/28/2018  . Insomnia 11/09/2017  . Multiple myeloma ([place]) 03/15/2017  . Urinary frequency 02/28/2017  . 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 CKD (chronic kidney disease) stage 3, GFR 30-59 ml/min ([facility]) 07/12/2015  . Anemia 10/14/2014  . Lumbar back pain with radiculopathy affecting left lower extremity 05/11/2014  . Obesity (BMI 30.0-34.9) 01/02/2014  . Generalized anxiety disorder 05/20/2013  . Anxiety and depression 08/13/2012  . Thoracic or lumbosacral neuritis or radiculitis, unspecified 12/22/2010  . Abdominal bloating 09/08/2009  . Colon adenomas 05/11/2009  . GERD 03/15/2009  . FATTY LIVER DISEASE 03/15/2009  . Sleep apnea 09/15/2008  . 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 Hyperlipidemia LDL goal <100 09/17/2007  . Hypertension goal BP (blood pressure) < 130/80 09/17/2007     OCCUPATIONAL THERAPY DISCHARGE SUMMARY  Visits from Start of Care: 4  Current functional level related to goals / functional outcomes: See above   Remaining deficits: Pain, decreased cervical rotation, increased pain now on  right side (neck and shoulder)   Education / Equipment: See above Plan: Patient agrees to discharge.  Patient goals were not met. Patient is being discharged due to lack of progress.  ?????          Ailene Ravel, OTR/L,CBIS  [phone]  05/03/2020, 4:02 PM  [facility] [address] [place], [place], [address] Phone: [phone]   Fax:  [phone]  Name: DJUANA LITTLETON MRN: [id number] Date of Birth: Jun 08, 1946

## 2020-05-03 NOTE — Patient Instructions (Signed)
    4) AROM: Neck Rotation   Turn head slowly to look over one shoulder, then the other. Hold each position __5__ seconds. Repeat __5 times each side. Do _1___ sets per session.   http://orth.exer.us/294   Copyright  VHI. All rights reserved.   Repeat all exercises 10 times, 1-2 times per day.  1) Shoulder Protraction (sitting or standing)    Begin with elbows by your side, slowly "punch" straight out in front of you.      2) Shoulder Flexion  Standing or Sitting:        Begin with arms at your side with thumbs pointed up, slowly raise both arms up and forward towards overhead.

## 2020-05-09 ENCOUNTER — Other Ambulatory Visit: Payer: Self-pay

## 2020-05-09 MED ORDER — ONETOUCH ULTRA VI STRP
ORAL_STRIP | 5 refills | Status: DC
Start: 2020-05-09 — End: 2021-07-12

## 2020-05-10 ENCOUNTER — Other Ambulatory Visit: Payer: Self-pay

## 2020-05-10 DIAGNOSIS — E1121 Type 2 diabetes mellitus with diabetic nephropathy: Secondary | ICD-10-CM

## 2020-05-10 DIAGNOSIS — I10 Essential (primary) hypertension: Secondary | ICD-10-CM

## 2020-05-10 DIAGNOSIS — E785 Hyperlipidemia, unspecified: Secondary | ICD-10-CM

## 2020-05-12 ENCOUNTER — Telehealth: Payer: Self-pay

## 2020-05-12 ENCOUNTER — Ambulatory Visit (HOSPITAL_COMMUNITY): Payer: Medicare Other

## 2020-05-12 NOTE — Telephone Encounter (Signed)
Called patient to schedule an appt. Per provider, she needs to have labs drawn (those have been ordered) and she needs a follow up appt to discuss the results of those labs. Left a vm asking patient to call back to schedule appt.

## 2020-05-12 NOTE — Telephone Encounter (Signed)
Spoke with Pt, she is aware of fasting labs, will do tomorrow, and appt is scheduled

## 2020-05-12 NOTE — Telephone Encounter (Signed)
Noted  

## 2020-05-13 DIAGNOSIS — I10 Essential (primary) hypertension: Secondary | ICD-10-CM | POA: Diagnosis not present

## 2020-05-13 DIAGNOSIS — E1121 Type 2 diabetes mellitus with diabetic nephropathy: Secondary | ICD-10-CM | POA: Diagnosis not present

## 2020-05-13 DIAGNOSIS — E785 Hyperlipidemia, unspecified: Secondary | ICD-10-CM | POA: Diagnosis not present

## 2020-05-14 LAB — LIPID PANEL
Chol/HDL Ratio: 3.3 ratio (ref 0.0–4.4)
Cholesterol, Total: 163 mg/dL (ref 100–199)
HDL: 49 mg/dL (ref >39)
LDL Chol Calc (NIH): 93 mg/dL (ref 0–99)
Triglycerides: 120 mg/dL (ref 0–149)
VLDL Cholesterol Cal: 21 mg/dL (ref 5–40)

## 2020-05-14 LAB — CMP14+EGFR
ALT: 30 IU/L (ref 0–32)
AST: 26 IU/L (ref 0–40)
Albumin/Globulin Ratio: 1.1 — ABNORMAL LOW (ref 1.2–2.2)
Albumin: 4.5 g/dL (ref 3.7–4.7)
Alkaline Phosphatase: 102 IU/L (ref 44–121)
BUN/Creatinine Ratio: 12 (ref 12–28)
BUN: 17 mg/dL (ref 8–27)
Bilirubin Total: <0.2 mg/dL (ref 0.0–1.2)
CO2: 22 mmol/L (ref 20–29)
Calcium: 10.7 mg/dL — ABNORMAL HIGH (ref 8.7–10.3)
Chloride: 101 mmol/L (ref 96–106)
Creatinine, Ser: 1.44 mg/dL — ABNORMAL HIGH (ref 0.57–1.00)
GFR calc Af Amer: 41 mL/min/{1.73_m2} — ABNORMAL LOW (ref >59)
GFR calc non Af Amer: 36 mL/min/{1.73_m2} — ABNORMAL LOW (ref >59)
Globulin, Total: 4.1 g/dL (ref 1.5–4.5)
Glucose: 99 mg/dL (ref 65–99)
Potassium: 4.5 mmol/L (ref 3.5–5.2)
Sodium: 138 mmol/L (ref 134–144)
Total Protein: 8.6 g/dL — ABNORMAL HIGH (ref 6.0–8.5)

## 2020-05-14 LAB — HEMOGLOBIN A1C
Est. average glucose Bld gHb Est-mCnc: 137 mg/dL
Hgb A1c MFr Bld: 6.4 % — ABNORMAL HIGH (ref 4.8–5.6)

## 2020-05-14 LAB — TSH: TSH: 2.81 u[IU]/mL (ref 0.450–4.500)

## 2020-05-19 ENCOUNTER — Encounter: Payer: Self-pay | Admitting: Family Medicine

## 2020-05-19 ENCOUNTER — Other Ambulatory Visit: Payer: Self-pay

## 2020-05-19 ENCOUNTER — Ambulatory Visit (INDEPENDENT_AMBULATORY_CARE_PROVIDER_SITE_OTHER): Payer: Medicare Other | Admitting: Family Medicine

## 2020-05-19 VITALS — BP 130/82 | HR 94 | Resp 16 | Ht 66.0 in | Wt 192.0 lb

## 2020-05-19 DIAGNOSIS — R0902 Hypoxemia: Secondary | ICD-10-CM | POA: Diagnosis not present

## 2020-05-19 DIAGNOSIS — E1121 Type 2 diabetes mellitus with diabetic nephropathy: Secondary | ICD-10-CM | POA: Diagnosis not present

## 2020-05-19 DIAGNOSIS — E785 Hyperlipidemia, unspecified: Secondary | ICD-10-CM | POA: Diagnosis not present

## 2020-05-19 DIAGNOSIS — F32A Depression, unspecified: Secondary | ICD-10-CM

## 2020-05-19 DIAGNOSIS — I10 Essential (primary) hypertension: Secondary | ICD-10-CM

## 2020-05-19 DIAGNOSIS — F411 Generalized anxiety disorder: Secondary | ICD-10-CM

## 2020-05-19 DIAGNOSIS — F324 Major depressive disorder, single episode, in partial remission: Secondary | ICD-10-CM

## 2020-05-19 DIAGNOSIS — E669 Obesity, unspecified: Secondary | ICD-10-CM

## 2020-05-19 DIAGNOSIS — F419 Anxiety disorder, unspecified: Secondary | ICD-10-CM

## 2020-05-19 NOTE — Patient Instructions (Addendum)
F/U in 4.5 months, call if you need me sooner  Blood work and blood pressure are excellent, no medication changes at this time  Please start committing to daily exercise    Think about what you will eat, plan ahead. Choose " clean, green, fresh or frozen" over canned, processed or packaged foods which are more sugary, salty and fatty. 70 to 75% of food eaten should be vegetables and fruit. Three meals at set times with snacks allowed between meals, but they must be fruit or vegetables. Aim to eat over a 12 hour period , example 7 am to 7 pm, and STOP after  your last meal of the day. Drink water,generally about 64 ounces per day, no other drink is as healthy. Fruit juice is best enjoyed in a healthy way, by EATING the fruit.    Exercises To Do While Sitting  Exercises that you do while sitting (chair exercises) can give you many of the same benefits as full exercise. Benefits include strengthening your heart, burning calories, and keeping muscles and joints healthy. Exercise can also improve your mood and help with depression and anxiety. You may benefit from chair exercises if you are unable to do standing exercises because of:  Diabetic foot pain.  Obesity.  Illness.  Arthritis.  Recovery from surgery or injury.  Breathing problems.  Balance problems.  Another type of disability. Before starting chair exercises, check with your health care provider or a physical therapist to find out how much exercise you can tolerate and which exercises are safe for you. If your health care provider approves:  Start out slowly and build up over time. Aim to work up to about 10-20 minutes for each exercise session.  Make exercise part of your daily routine.  Drink water when you exercise. Do not wait until you are thirsty. Drink every 10-15 minutes.  Stop exercising right away if you have pain, nausea, shortness of breath, or dizziness.  If you are exercising in a wheelchair, make  sure to lock the wheels.  Ask your health care provider whether you can do tai chi or yoga. Many positions in these mind-body exercises can be modified to do while seated. Warm-up Before starting other exercises: 1. Sit up as straight as you can. Have your knees bent at 90 degrees, which is the shape of the capital letter "L." Keep your feet flat on the floor. 2. Sit at the front edge of your chair, if you can. 3. Pull in (tighten) the muscles in your abdomen and stretch your spine and neck as straight as you can. Hold this position for a few minutes. 4. Breathe in and out evenly. Try to concentrate on your breathing, and relax your mind. Stretching Exercise A: Arm stretch 1. Hold your arms out straight in front of your body. 2. Bend your hands at the wrist with your fingers pointing up, as if signaling someone to stop. Notice the slight tension in your forearms as you hold the position. 3. Keeping your arms out and your hands bent, rotate your hands outward as far as you can and hold this stretch. Aim to have your thumbs pointing up and your pinkie fingers pointing down. Slowly repeat arm stretches for one minute as tolerated. Exercise B: Leg stretch 1. If you can move your legs, try to "draw" letters on the floor with the toes of your foot. Write your name with one foot. 2. Write your name with the toes of your other foot. Slowly repeat  the movements for one minute as tolerated. Exercise C: Reach for the sky 1. Reach your hands as far over your head as you can to stretch your spine. 2. Move your hands and arms as if you are climbing a rope. Slowly repeat the movements for one minute as tolerated. Range of motion exercises Exercise A: Shoulder roll 1. Let your arms hang loosely at your sides. 2. Lift just your shoulders up toward your ears, then let them relax back down. 3. When your shoulders feel loose, rotate your shoulders in backward and forward circles. Do shoulder rolls slowly for  one minute as tolerated. Exercise B: March in place 1. As if you are marching, pump your arms and lift your legs up and down. Lift your knees as high as you can. ? If you are unable to lift your knees, just pump your arms and move your ankles and feet up and down. March in place for one minute as tolerated. Exercise C: Seated jumping jacks 1. Let your arms hang down straight. 2. Keeping your arms straight, lift them up over your head. Aim to point your fingers to the ceiling. 3. While you lift your arms, straighten your legs and slide your heels along the floor to your sides, as wide as you can. 4. As you bring your arms back down to your sides, slide your legs back together. ? If you are unable to use your legs, just move your arms. Slowly repeat seated jumping jacks for one minute as tolerated. Strengthening exercises Exercise A: Shoulder squeeze 1. Hold your arms straight out from your body to your sides, with your elbows bent and your fists pointed at the ceiling. 2. Keeping your arms in the bent position, move them forward so your elbows and forearms meet in front of your face. 3. Open your arms back out as wide as you can with your elbows still bent, until you feel your shoulder blades squeezing together. Hold for 5 seconds. Slowly repeat the movements forward and backward for one minute as tolerated. Contact a health care provider if you:  Had to stop exercising due to any of the following: ? Pain. ? Nausea. ? Shortness of breath. ? Dizziness. ? Fatigue.  Have significant pain or soreness after exercising. Get help right away if you have:  Chest pain.  Difficulty breathing. These symptoms may represent a serious problem that is an emergency. Do not wait to see if the symptoms will go away. Get medical help right away. Call your local emergency services (911 in the [place] Do not drive yourself to the hospital. This information is not intended to replace advice given to you by  your health care provider. Make sure you discuss any questions you have with your health care provider. Document Revised: 09/18/2018 Document Reviewed: 04/10/2017 Elsevier Patient Education  2020 [employer].

## 2020-05-20 ENCOUNTER — Other Ambulatory Visit: Payer: Self-pay | Admitting: Cardiology

## 2020-05-22 ENCOUNTER — Encounter: Payer: Self-pay | Admitting: Family Medicine

## 2020-05-22 NOTE — Assessment & Plan Note (Signed)
  Patient re-educated about  the importance of commitment to a  minimum of 150 minutes of exercise per week as able.  The importance of healthy food choices with portion control discussed, as well as eating regularly and within a 12 hour window most days. The need to choose "clean , green" food 50 to 75% of the time is discussed, as well as to make water the primary drink and set a goal of 64 ounces water daily.    Weight /BMI 05/19/2020 04/26/2020 04/04/2020  WEIGHT 192 lb 0.6 oz 188 lb 185 lb  HEIGHT 5\' 6"  5' 5.5" 5' 6.5"  BMI 31 kg/m2 30.81 kg/m2 29.41 kg/m2

## 2020-05-22 NOTE — Assessment & Plan Note (Signed)
Hyperlipidemia:Low fat diet discussed and encouraged.   Lipid Panel  Lab Results  Component Value Date   CHOL 163 05/13/2020   HDL 49 05/13/2020   LDLCALC 93 05/13/2020   TRIG 120 05/13/2020   CHOLHDL 3.3 05/13/2020

## 2020-05-22 NOTE — Assessment & Plan Note (Signed)
Controlled, no change in medication  

## 2020-05-22 NOTE — Assessment & Plan Note (Signed)
Controlled, no change in medication.das DASH diet and commitment to daily physical activity for a minimum of 30 minutes discussed and encouraged, as a part of hypertension management. The importance of attaining a healthy weight is also discussed.  BP/Weight 05/19/2020 04/26/2020 04/04/2020 04/01/2020 03/21/2020 03/15/2020 2/00/4159  Systolic BP 301 237 990 940 005 056 788  Diastolic BP 82 71 82 73 78 76 94  Wt. (Lbs) 192.04 188 185 185 185 188 -  BMI 31 30.81 29.41 29.41 29.41 25.5 -

## 2020-05-22 NOTE — Assessment & Plan Note (Signed)
Controlled, no change in medication Cheryl Abbott is reminded of the importance of commitment to daily physical activity for 30 minutes or more, as able and the need to limit carbohydrate intake to 30 to 60 grams per meal to help with blood sugar control.   The need to take medication as prescribed, test blood sugar as directed, and to call between visits if there is a concern that blood sugar is uncontrolled is also discussed.   Cheryl Abbott is reminded of the importance of daily foot exam, annual eye examination, and good blood sugar, blood pressure and cholesterol control.  Diabetic Labs Latest Ref Rng & Units 05/13/2020 02/01/2020 11/20/2019 11/18/2019 09/28/2019  HbA1c 4.8 - 5.6 % 6.4(H) - - 6.4(H) -  Microalbumin mg/dL - - 4.7 - -  Micro/Creat Ratio 0.0 - 30.0 mg/g - - - - -  Chol 100 - 199 mg/dL 163 - - 190 -  HDL >39 mg/dL 49 - - 43(L) -  Calc LDL 0 - 99 mg/dL 93 - - 122(H) -  Triglycerides 0 - 149 mg/dL 120 - - 130 -  Creatinine 0.57 - 1.00 mg/dL 1.44(H) 1.71(H) - 1.53(H) 1.73(H)   BP/Weight 05/19/2020 04/26/2020 04/04/2020 04/01/2020 03/21/2020 03/15/2020 3/78/5885  Systolic BP 027 741 287 867 672 094 709  Diastolic BP 82 71 82 73 78 76 94  Wt. (Lbs) 192.04 188 185 185 185 188 -  BMI 31 30.81 29.41 29.41 29.41 25.5 -   Foot/eye exam completion dates Latest Ref Rng & Units 01/07/2020 12/28/2019  Eye Exam No Retinopathy - No Retinopathy  Foot exam Order - - -  Foot Form Completion - Done -

## 2020-05-22 NOTE — Progress Notes (Signed)
Cheryl Abbott     MRN: [id number]      DOB: May 10, 1946   HPI [name] is here for follow up and re-evaluation of chronic medical conditions, medication management and review of any available recent lab and radiology data.  Preventive health is updated, specifically  Cancer screening and Immunization.   Questions or concerns regarding consultations or procedures which the PT has had in the interim are  addressed. The PT denies any adverse reactions to current medications since the last visit.  There are no new concerns.  There are no specific complaints   ROS Denies recent fever or chills. Denies sinus pressure, nasal congestion, ear pain or sore throat. Denies chest congestion, productive cough or wheezing. Denies chest pains, palpitations and leg swelling Denies abdominal pain, nausea, vomiting,diarrhea or constipation.   Denies dysuria, frequency, hesitancy or incontinence. Denies uncontrolled joint pain, swelling and limitation in mobility. Denies headaches, seizures, numbness, or tingling. Denies depression, anxiety or insomnia. Denies skin break down or rash.   PE  BP 130/82   Pulse 94   Resp 16   Ht 5\' 6"  (1.676 m)   Wt 192 lb 0.6 oz (87.1 kg)   SpO2 95%   BMI 31.00 kg/m   Patient alert and oriented and in no cardiopulmonary distress.  HEENT: No facial asymmetry, EOMI,     Neck supple .  Chest: Clear to auscultation bilaterally.  CVS: S1, S2 no murmurs, no S3.Regular rate.  ABD: Soft non tender.   Ext: No edema  MS: Adequate ROM spine, shoulders, hips and knees.  Skin: Intact, no ulcerations or rash noted.  Psych: Good eye contact, normal affect. Memory intact not anxious or depressed appearing.  CNS: CN 2-12 intact, power,  normal throughout.no focal deficits noted.   Assessment & Plan  Hypertension goal BP (blood pressure) < 130/80 Controlled, no change in medication.das DASH diet and commitment to daily physical activity for a minimum of 30  minutes discussed and encouraged, as a part of hypertension management. The importance of attaining a healthy weight is also discussed.  BP/Weight 05/19/2020 04/26/2020 04/04/2020 04/01/2020 03/21/2020 03/15/2020 09/04/7122  Systolic BP 580 998 338 250 539 767 341  Diastolic BP 82 71 82 73 78 76 94  Wt. (Lbs) 192.04 188 185 185 185 188 -  BMI 31 30.81 29.41 29.41 29.41 25.5 -       Hyperlipidemia LDL goal <100 Hyperlipidemia:Low fat diet discussed and encouraged.   Lipid Panel  Lab Results  Component Value Date   CHOL 163 05/13/2020   HDL 49 05/13/2020   LDLCALC 93 05/13/2020   TRIG 120 05/13/2020   CHOLHDL 3.3 05/13/2020       Type 2 diabetes with nephropathy (HCC) Controlled, no change in medication [name] is reminded of the importance of commitment to daily physical activity for 30 minutes or more, as able and the need to limit carbohydrate intake to 30 to 60 grams per meal to help with blood sugar control.   The need to take medication as prescribed, test blood sugar as directed, and to call between visits if there is a concern that blood sugar is uncontrolled is also discussed.   [name] is reminded of the importance of daily foot exam, annual eye examination, and good blood sugar, blood pressure and cholesterol control.  Diabetic Labs Latest Ref Rng & Units 05/13/2020 02/01/2020 11/20/2019 11/18/2019 09/28/2019  HbA1c 4.8 - 5.6 % 6.4(H) - - 6.4(H) -  Microalbumin mg/dL - -  4.7 - -  Micro/Creat Ratio 0.0 - 30.0 mg/g - - - - -  Chol 100 - 199 mg/dL 163 - - 190 -  HDL >39 mg/dL 49 - - 43(L) -  Calc LDL 0 - 99 mg/dL 93 - - 122(H) -  Triglycerides 0 - 149 mg/dL 120 - - 130 -  Creatinine 0.57 - 1.00 mg/dL 1.44(H) 1.71(H) - 1.53(H) 1.73(H)   BP/Weight 05/19/2020 04/26/2020 04/04/2020 04/01/2020 03/21/2020 03/15/2020 5/69/7948  Systolic BP 016 553 748 270 786 754 492  Diastolic BP 82 71 82 73 78 76 94  Wt. (Lbs) 192.04 188 185 185 185 188 -  BMI 31 30.81 29.41 29.41  29.41 25.5 -   Foot/eye exam completion dates Latest Ref Rng & Units 01/07/2020 12/28/2019  Eye Exam No Retinopathy - No Retinopathy  Foot exam Order - - -  Foot Form Completion - Done -        Generalized anxiety disorder Controlled, no change in medication   Anxiety and depression Controlled, no change in medication   Obesity (BMI 30.0-34.9)  Patient re-educated about  the importance of commitment to a  minimum of 150 minutes of exercise per week as able.  The importance of healthy food choices with portion control discussed, as well as eating regularly and within a 12 hour window most days. The need to choose "clean , green" food 50 to 75% of the time is discussed, as well as to make water the primary drink and set a goal of 64 ounces water daily.    Weight /BMI 05/19/2020 04/26/2020 04/04/2020  WEIGHT 192 lb 0.6 oz 188 lb 185 lb  HEIGHT 5\' 6"  5' 5.5" 5' 6.5"  BMI 31 kg/m2 30.81 kg/m2 29.41 kg/m2      Depression, major, single episode, in partial remission (HCC) Controlled, no change in medication

## 2020-05-23 ENCOUNTER — Other Ambulatory Visit: Payer: Self-pay | Admitting: Family Medicine

## 2020-06-06 ENCOUNTER — Ambulatory Visit (HOSPITAL_COMMUNITY)
Admission: RE | Admit: 2020-06-06 | Discharge: 2020-06-06 | Disposition: A | Discharge status: Home / Self Care | Payer: Medicare Other | Source: Ambulatory Visit | Attending: Family Medicine | Admitting: Family Medicine

## 2020-06-06 ENCOUNTER — Other Ambulatory Visit: Payer: Self-pay | Admitting: Family Medicine

## 2020-06-06 DIAGNOSIS — Z1231 Encounter for screening mammogram for malignant neoplasm of breast: Secondary | ICD-10-CM | POA: Diagnosis not present

## 2020-06-16 ENCOUNTER — Other Ambulatory Visit: Payer: Self-pay | Admitting: Family Medicine

## 2020-06-19 DIAGNOSIS — R0902 Hypoxemia: Secondary | ICD-10-CM | POA: Diagnosis not present

## 2020-06-21 ENCOUNTER — Other Ambulatory Visit: Payer: Self-pay

## 2020-06-21 ENCOUNTER — Encounter: Payer: Self-pay | Admitting: Family Medicine

## 2020-06-21 ENCOUNTER — Telehealth (INDEPENDENT_AMBULATORY_CARE_PROVIDER_SITE_OTHER): Payer: Medicare Other | Admitting: Family Medicine

## 2020-06-21 DIAGNOSIS — R21 Rash and other nonspecific skin eruption: Secondary | ICD-10-CM

## 2020-06-21 DIAGNOSIS — M25511 Pain in right shoulder: Secondary | ICD-10-CM | POA: Diagnosis not present

## 2020-06-21 MED ORDER — BETAMETHASONE DIPROPIONATE 0.05 % EX CREA: TOPICAL_CREAM | Freq: Two times a day (BID) | CUTANEOUS | 0 refills | Status: DC

## 2020-06-21 NOTE — Progress Notes (Signed)
Virtual Visit via Telephone Note  I connected with Cheryl Abbott on 06/21/20 at 10:20 AM EST by telephone and verified that I am speaking with the correct person using two identifiers.  Location: Patient: home Provider: office   I discussed the limitations, risks, security and privacy concerns of performing an evaluation and management service by telephone and the availability of in person appointments. I also discussed with the patient that there may be a patient responsible charge related to this service. The patient expressed understanding and agreed to proceed.   History of Present Illness: 3 day h/o right shoulder pain rated at 7 , aggravated and limiting movement in every direction , radiates to mid upper arm, reports swelling at back of neck no limitation in neck movement, no new weakness, constantly drops things, which is unchanged Had her shingles 2nd dose  in right arm end December Rash on arms and legs x 3 days, itchy   Observations/Objective: There were no vitals taken for this visit. Good communication with no confusion and intact memory. Alert and oriented x 3 No signs of respiratory distress during speech    Assessment and Plan: Right shoulder pain 3 day h/o right shoulder pain no trauma, limited movement, asap Orthopedic appt,  Prefers Dr Aline Brochure  Rash and nonspecific skin eruption Topical steroid twice daily for 5 to 7 days , then as needed    Follow Up Instructions:    I discussed the assessment and treatment plan with the patient. The patient was provided an opportunity to ask questions and all were answered. The patient agreed with the plan and demonstrated an understanding of the instructions.   The patient was advised to call back or seek an in-person evaluation if the symptoms worsen or if the condition fails to improve as anticipated.  I provided 10 minutes of non-face-to-face time during this encounter.   Tula Nakayama, MD

## 2020-06-21 NOTE — Assessment & Plan Note (Signed)
3 day h/o right shoulder pain no trauma, limited movement, asap Orthopedic appt,  Prefers Dr Aline Brochure

## 2020-06-21 NOTE — Patient Instructions (Signed)
F/U in office as before , call if you need me sooner  You are referred urgently to Dr Aline Brochure re right shoulder  Topical steroid is prescribed for rash    Hope you feel better soon   Best for 2022!

## 2020-06-23 ENCOUNTER — Other Ambulatory Visit: Payer: Self-pay

## 2020-06-23 ENCOUNTER — Encounter: Payer: Self-pay | Admitting: Orthopedic Surgery

## 2020-06-23 ENCOUNTER — Ambulatory Visit: Payer: Medicare Other

## 2020-06-23 ENCOUNTER — Ambulatory Visit (INDEPENDENT_AMBULATORY_CARE_PROVIDER_SITE_OTHER): Payer: Medicare Other | Admitting: Orthopedic Surgery

## 2020-06-23 VITALS — BP 119/84 | HR 93 | Ht 66.0 in | Wt 188.0 lb

## 2020-06-23 DIAGNOSIS — M25511 Pain in right shoulder: Secondary | ICD-10-CM

## 2020-06-23 DIAGNOSIS — G8929 Other chronic pain: Secondary | ICD-10-CM

## 2020-06-23 DIAGNOSIS — M7551 Bursitis of right shoulder: Secondary | ICD-10-CM

## 2020-06-23 DIAGNOSIS — M25512 Pain in left shoulder: Secondary | ICD-10-CM

## 2020-06-23 NOTE — Patient Instructions (Addendum)
Keep your activities limited do not lift arm overhead for 3 to 5 days  Use heat at night   Diagnosis bursitis of the shoulder   Bursitis  Bursitis is inflammation and irritation of a bursa, which is one of the small, fluid-filled sacs that cushion and protect the moving parts of your body. These sacs are located between bones and muscles, bones and muscle attachments, or bones and skin areas that are next to bones. A bursa protects those structures from the wear and tear that results from frequent movement. An inflamed bursa causes pain and swelling. Fluid may build up inside the sac. Bursitis is most common near joints, especially the knees, elbows, hips, and shoulders. What are the causes? This condition may be caused by:  Injury from: ? A direct hit (blow), like falling on your knee or elbow. ? Overuse of a joint (repetitive stress).  Infection. This can happen if bacteria get into a bursa through a cut or scrape near a joint.  Diseases that cause joint inflammation, such as gout and rheumatoid arthritis. What increases the risk? You are more likely to develop this condition if you:  Have a job or hobby that involves a lot of repetitive stress on your joints.  Have a condition that weakens your body's defense system (immune system), such as diabetes, cancer, or HIV.  Do any of these often: ? Lift and reach overhead. ? Kneel or lean on hard surfaces. ? Run or walk. What are the signs or symptoms? The most common symptoms of this condition include:  Pain that gets worse when you move the affected body part or use it to support (bear) your body weight.  Inflammation.  Stiffness. Other symptoms include:  Redness.  Swelling.  Tenderness.  Warmth.  Pain that continues after rest.  Fever or chills. These may occur in bursitis that is caused by infection. How is this diagnosed? This condition may be diagnosed based on:  Your medical history and a physical  exam.  Imaging tests, such as an MRI.  A procedure to drain fluid from the bursa with a needle (aspiration). The fluid may be checked for signs of infection or gout.  Blood tests to rule out other causes of inflammation. How is this treated? This condition can usually be treated at home with rest, ice, applying pressure (compression), and raising the body part that is affected (elevation). This is called RICE therapy. For mild bursitis, RICE therapy may be all you need. Other treatments may include:  NSAIDs to treat pain and inflammation.  Corticosteroid medicines to fight inflammation. These medicines may be injected into and around the area of bursitis.  Aspiration of fluid from the bursa to relieve pain and improve movement.  Antibiotic medicine to treat an infected bursa.  A splint, brace, or walking aid, such as a cane.  Physical therapy if you continue to have pain or limited movement.  Surgery to remove a damaged or infected bursa. This may be needed if other treatments have not worked. Follow these instructions at home: Medicines  Take over-the-counter and prescription medicines only as told by your health care provider.  If you were prescribed an antibiotic medicine, take it as told by your health care provider. Do not stop taking the antibiotic even if you start to feel better. General instructions  Rest the affected area as told by your health care provider. ? If possible, raise (elevate) the affected area above the level of your heart while you are sitting  or lying down. ? Avoid activities that make pain worse.  Use splints, braces, pads, or walking aids as told by your health care provider.  If directed, put ice on the affected area: ? If you have a removable splint or brace, remove it as told by your health care provider. ? Put ice in a plastic bag. ? Place a towel between your skin and the bag or between your splint or brace and the bag. ? Leave the ice on for  20 minutes, 2-3 times a day.  Keep all follow-up visits as told by your health care provider. This is important.   Preventing future episodes Take actions to help prevent future episodes of bursitis.  Wear knee pads if you kneel often.  Wear sturdy running or walking shoes that fit you well.  Take breaks regularly from repetitive activity.  Warm up by stretching before doing any activity that takes a lot of effort.  Maintain a healthy weight or lose weight as recommended by your health care provider. If you need help doing this, ask your health care provider.  Exercise regularly. Start any new physical activity gradually. Contact a health care provider if you:  Have a fever.  Have chills.  Have bursitis that is not getting better with treatment or home care. Summary  Bursitis is inflammation and irritation of a bursa, which is one of the small, fluid-filled sacs that cushion and protect the moving parts of your body.  An inflamed bursa causes pain and swelling.  Bursitis is commonly diagnosed with a physical exam, but other tests are sometimes needed.  This condition can usually be treated at home with rest, ice, applying pressure (compression), and raising the body part that is affected (elevation). This is called RICE therapy. This information is not intended to replace advice given to you by your health care provider. Make sure you discuss any questions you have with your health care provider. Document Revised: 12/02/2019 Document Reviewed: 12/02/2019 Elsevier Patient Education  [place].

## 2020-06-23 NOTE — Progress Notes (Signed)
Chief Complaint  Patient presents with  . Shoulder Pain    Patient reports some neck and shoulder pain,     Encounter Diagnoses  Name Primary?  . Chronic left shoulder pain   . Bursitis of left shoulder Yes    Assessment and plan acute bursitis right shoulder  Recommend activity modification for 3 to 5 days Subacromial injection right shoulder    Procedure note the subacromial injection shoulder RIGHT    Verbal consent was obtained to inject the  RIGHT   Shoulder  Timeout was completed to confirm the injection site is a subacromial space of the  RIGHT  shoulder   Medication used Depo-Medrol 40 mg and lidocaine 1% 3 cc  Anesthesia was provided by ethyl chloride  The injection was performed in the RIGHT  posterior subacromial space. After pinning the skin with alcohol and anesthetized the skin with ethyl chloride the subacromial space was injected using a 20-gauge needle. There were no complications  Sterile dressing was applied.  Return as needed if no improvement    75 year old female with known cervical spondylosis seen by neurosurgery thought to have nonoperative disease presents with acute onset of atraumatic right shoulder pain severe enough to cause her to have difficulty sleeping difficulty lifting her arm and difficulty lying on her right side     Review of Systems  Constitutional: Negative for chills and fever.  Musculoskeletal: Positive for neck pain.  Skin: Positive for rash.  Neurological: Negative for tingling.   Past Medical History:  Diagnosis Date  . Abnormal brain scan 02/07/2012  . Anemia   . Anxiety   . Anxiety disorder   . Arthritis   . Brain TIA 08/19/2015  . Cancer ([place])    acute myeloma  . 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 Chronic back pain   . Depression   . Fatty liver   . GERD (gastroesophageal reflux disease)   . Headache   . HEMORRHOIDS 03/15/2009   OCT 2014 HB 12. 9    . History of adenomatous polyp of colon     2008  . History of kidney stones   . Hypercalcemia   . Hypercalcemia 11/30/2010  . Hyperlipidemia   . Hypertension   . Lactose intolerance in adult 02/01/2016  . Left ureteral stone   . Lipoma of arm 01/19/2015  . Lumbar disc disease with radiculopathy   . Nephrolithiasis   . Neuropathic pain of both feet 04/11/2016  . Neuropathy, peripheral   . Nocturnal hypoxia 02/04/2014   Study confirms this done 02/10/2014   . OSA treated with BiPAP    per study 2007  . RENAL CALCULUS 03/15/2009   Qualifier: Diagnosis of  By: Zeb Comfort    . Sciatica   . Shoulder pain, bilateral   . Sigmoid diverticulosis   . TIA (transient ischemic attack) 02/26/2016  . Type 2 diabetes mellitus ([place])   . Vaginal dryness, menopausal 10/14/2016   BP 119/84   Pulse 93   Ht 5\' 6"  (1.676 m)   Wt 188 lb (85.3 kg)   BMI 30.34 kg/m   Physical Exam Constitutional:      General: She is not in acute distress.    Appearance: She is normal weight. She is not ill-appearing.  Musculoskeletal:     Comments: Right shoulder painful range of motion but no stiffness or adhesive capsulitis.  Painful arc of motion on flexion abduction external rotation warmth rash involves whole body treated with cream by primary care no instability  noted weakness secondary to pain  Skin:    General: Skin is warm and dry.     Capillary Refill: Capillary refill takes less than 2 seconds.     Findings: Rash present.  Neurological:     General: No focal deficit present.     Mental Status: She is alert and oriented to person, place, and time.     Sensory: No sensory deficit.     Gait: Gait normal.     Deep Tendon Reflexes: Reflexes abnormal.  Psychiatric:        Mood and Affect: Mood normal.        Behavior: Behavior normal.        Thought Content: Thought content normal.        Judgment: Judgment normal.    Internal imaging showed no fracture or dislocation of the right shoulder see report

## 2020-06-25 ENCOUNTER — Encounter: Payer: Self-pay | Admitting: Family Medicine

## 2020-06-25 DIAGNOSIS — R21 Rash and other nonspecific skin eruption: Secondary | ICD-10-CM | POA: Insufficient documentation

## 2020-06-25 NOTE — Assessment & Plan Note (Signed)
Topical steroid twice daily for 5 to 7 days , then as needed

## 2020-07-05 ENCOUNTER — Telehealth: Payer: Self-pay | Admitting: Orthopedic Surgery

## 2020-07-05 NOTE — Telephone Encounter (Signed)
Please advise 

## 2020-07-05 NOTE — Telephone Encounter (Signed)
Patient has been taking Robaxin 500 mg but would like to have something stronger.  Patient states she uses [facility]

## 2020-07-06 ENCOUNTER — Other Ambulatory Visit: Payer: Self-pay | Admitting: Orthopedic Surgery

## 2020-07-06 MED ORDER — METHOCARBAMOL 750 MG PO TABS: 750.0000 mg | ORAL_TABLET | Freq: Four times a day (QID) | ORAL | 2 refills | Status: DC

## 2020-07-06 NOTE — Progress Notes (Signed)
Meds ordered this encounter  Medications  . methocarbamol (ROBAXIN) 750 MG tablet    Sig: Take 1 tablet (750 mg total) by mouth 4 (four) times daily.    Dispense:  60 tablet    Refill:  2

## 2020-07-07 ENCOUNTER — Encounter: Payer: Self-pay | Admitting: Internal Medicine

## 2020-07-13 ENCOUNTER — Other Ambulatory Visit: Payer: Self-pay | Admitting: Family Medicine

## 2020-07-14 ENCOUNTER — Other Ambulatory Visit: Payer: Self-pay | Admitting: Family Medicine

## 2020-07-15 ENCOUNTER — Other Ambulatory Visit: Payer: Self-pay | Admitting: Orthopedic Surgery

## 2020-07-20 DIAGNOSIS — R0902 Hypoxemia: Secondary | ICD-10-CM | POA: Diagnosis not present

## 2020-07-25 DIAGNOSIS — M5412 Radiculopathy, cervical region: Secondary | ICD-10-CM | POA: Diagnosis not present

## 2020-07-25 DIAGNOSIS — M542 Cervicalgia: Secondary | ICD-10-CM | POA: Diagnosis not present

## 2020-07-25 DIAGNOSIS — M5021 Other cervical disc displacement,  high cervical region: Secondary | ICD-10-CM | POA: Diagnosis not present

## 2020-07-29 ENCOUNTER — Other Ambulatory Visit: Payer: Self-pay | Admitting: Neurosurgery

## 2020-07-29 ENCOUNTER — Other Ambulatory Visit (HOSPITAL_COMMUNITY): Payer: Self-pay | Admitting: Neurosurgery

## 2020-07-29 DIAGNOSIS — M5412 Radiculopathy, cervical region: Secondary | ICD-10-CM

## 2020-07-29 IMAGING — DX DG CHEST 2V
2 series · 2 of 2 positions shown · non-contrast
Comparison: PA and lateral chest x-ray February 26, 2017

CLINICAL DATA: Exertional dyspnea

EXAM:
CHEST - 2 VIEW

[chest pa]
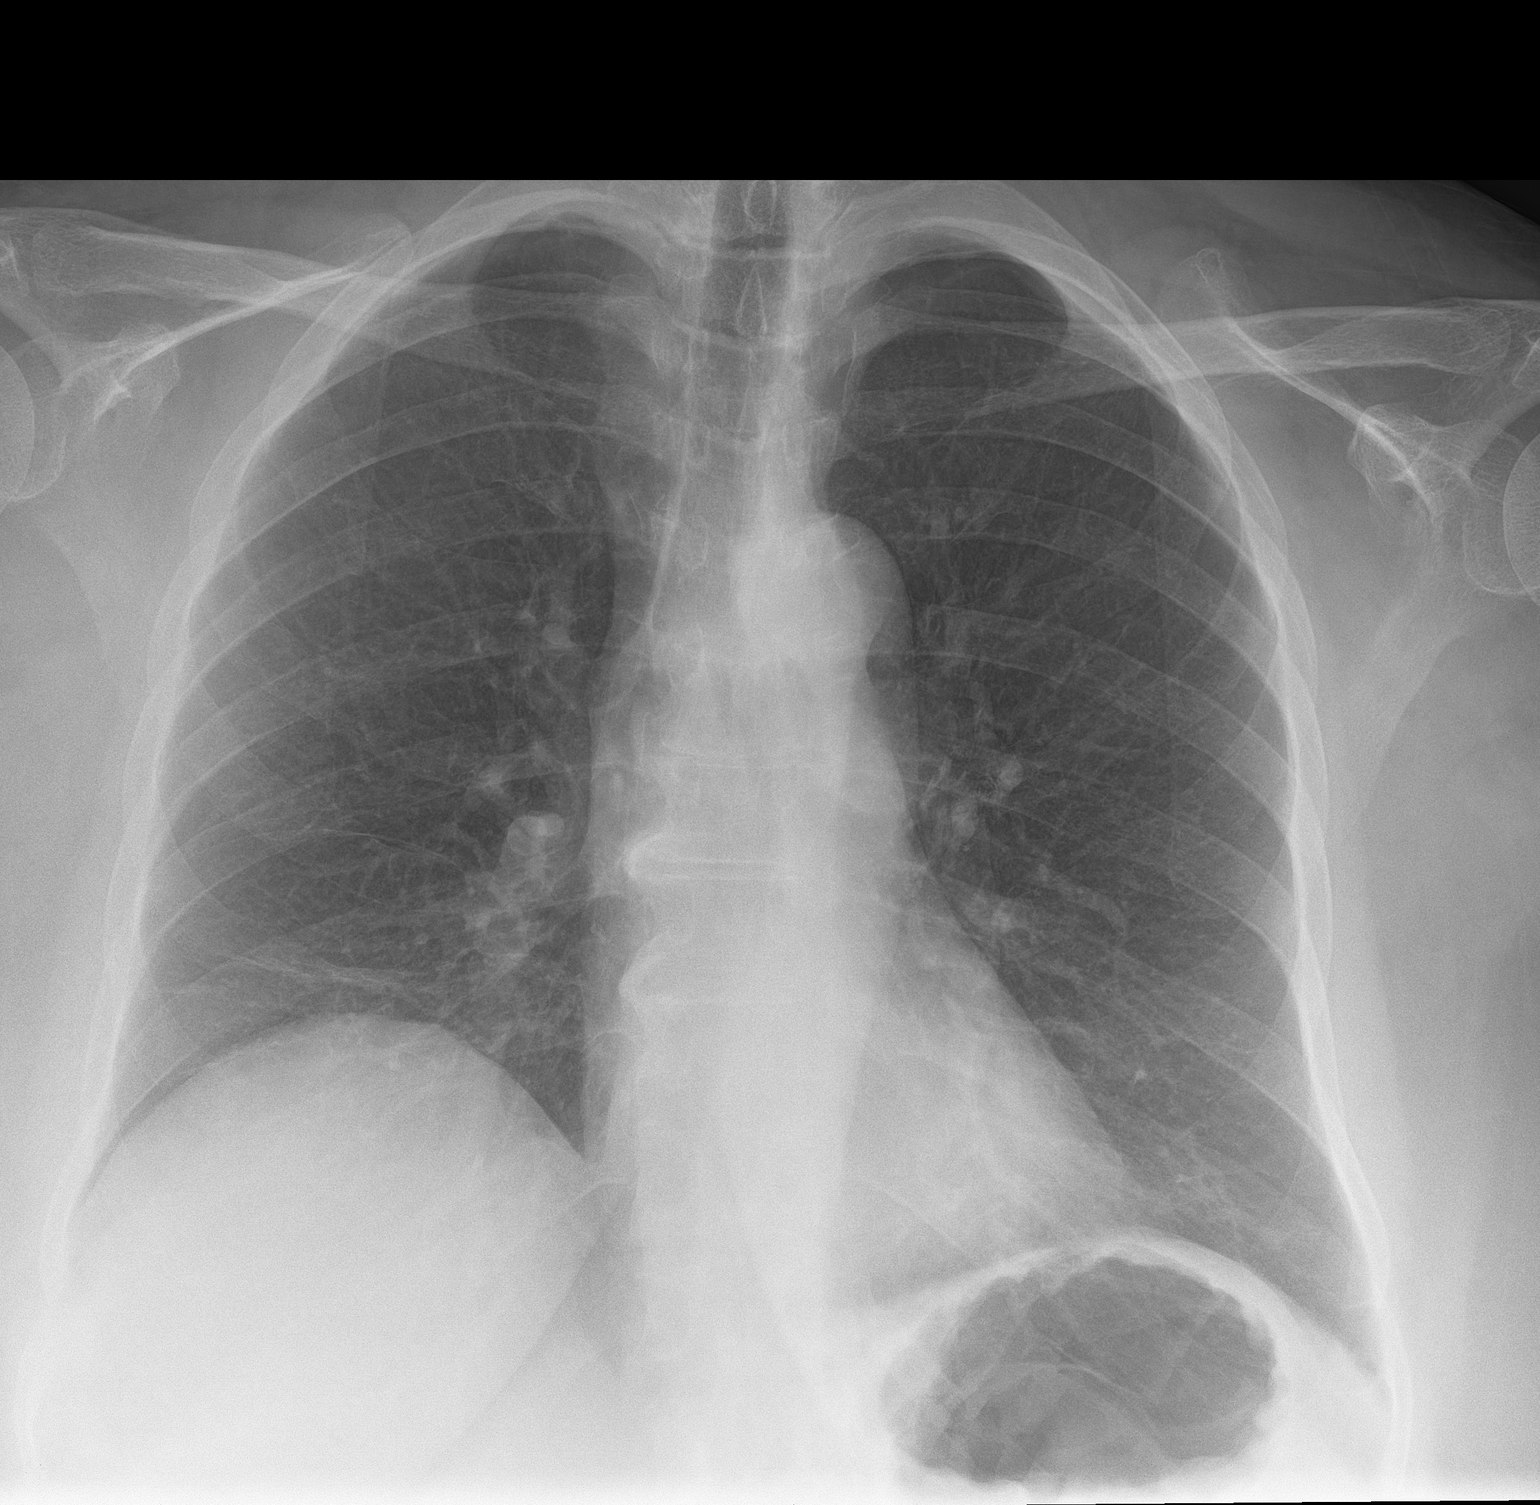

[chest lat]
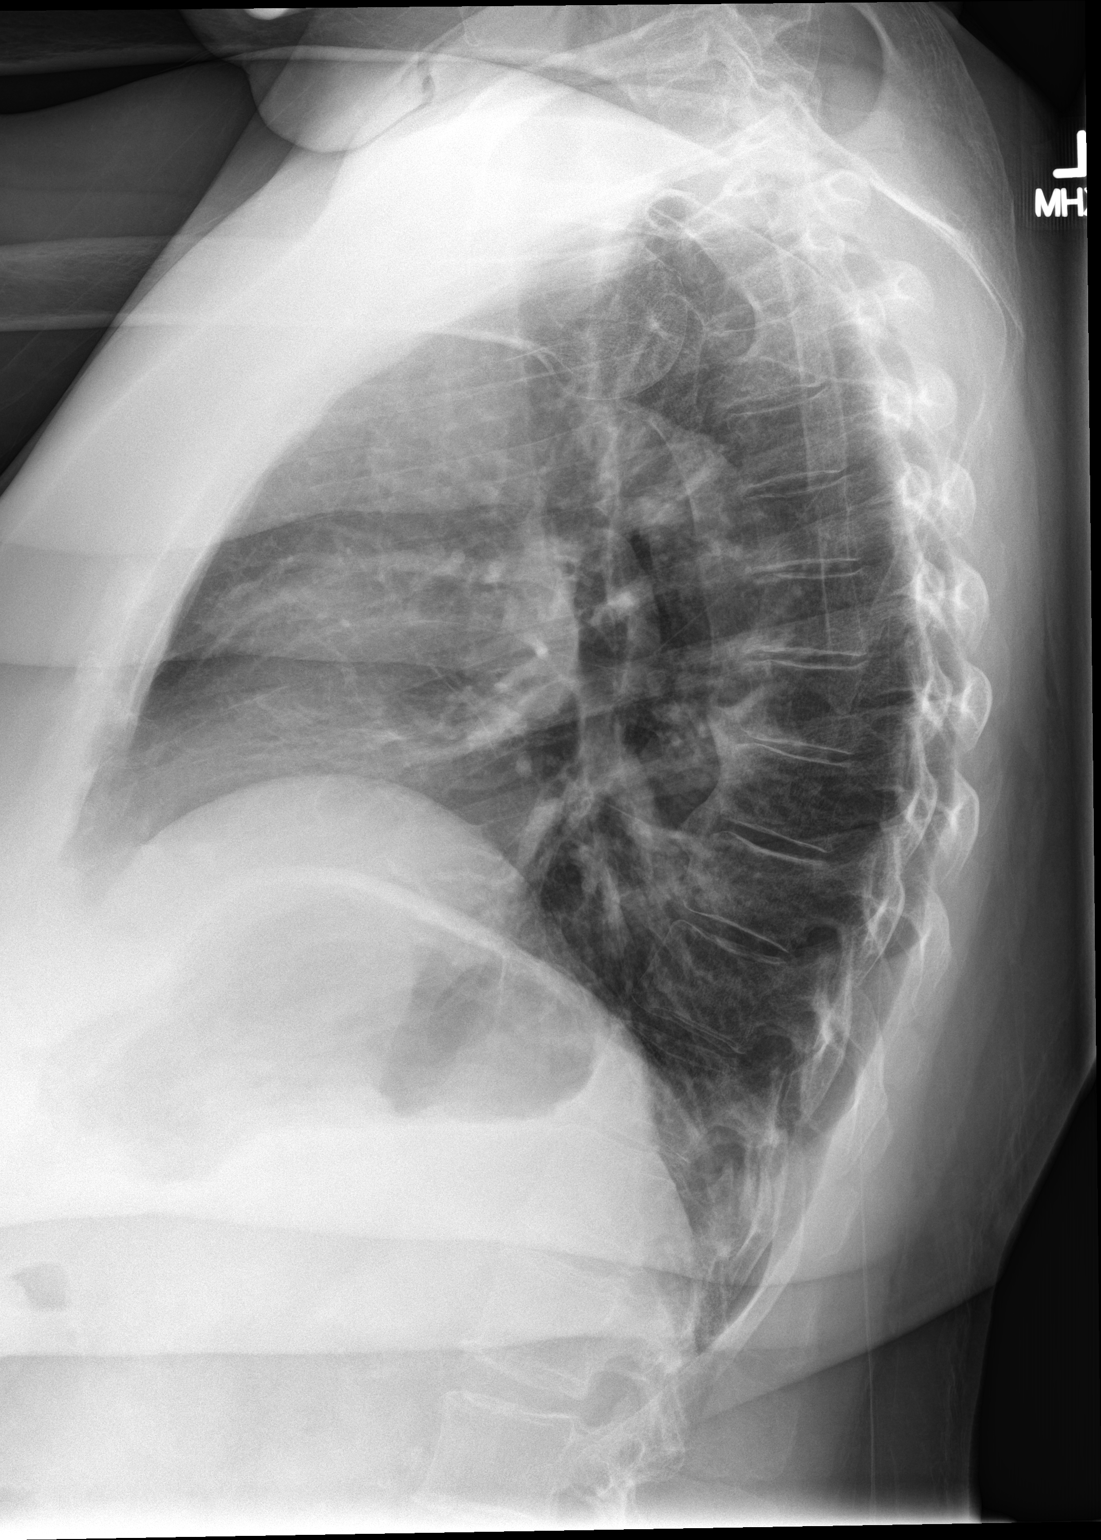

[2 of 2 positions shown; findings below may reference images not displayed]

FINDINGS: The lungs are well-expanded. There is linear increased density in
the right middle lobe which is not entirely new. There is no
alveolar infiltrate. There is no pleural effusion or pneumothorax.
The heart and pulmonary vascularity are normal. The trachea is
midline. There is calcification in the wall of the aortic arch. The
bony thorax exhibits no acute abnormality.
IMPRESSION: Chronic bronchitic-smoking related changes. Subsegmental atelectasis
in the right middle lobe is suspected. Correlation as to the
presence of fever or elevated white count is needed to judge whether
there may be acute bronchitis or early pneumonia. If such clinical
findings are present, followup PA and lateral chest X-ray is
recommended in 3-4 weeks following trial of antibiotic therapy to
ensure resolution and exclude underlying malignancy.

## 2020-07-30 ENCOUNTER — Other Ambulatory Visit: Payer: Self-pay | Admitting: Family Medicine

## 2020-08-02 ENCOUNTER — Other Ambulatory Visit: Payer: Self-pay | Admitting: Family Medicine

## 2020-08-16 ENCOUNTER — Other Ambulatory Visit: Payer: Self-pay

## 2020-08-16 ENCOUNTER — Ambulatory Visit (INDEPENDENT_AMBULATORY_CARE_PROVIDER_SITE_OTHER): Payer: Medicare Other | Admitting: Nurse Practitioner

## 2020-08-16 ENCOUNTER — Encounter: Payer: Self-pay | Admitting: Nurse Practitioner

## 2020-08-16 DIAGNOSIS — M79661 Pain in right lower leg: Secondary | ICD-10-CM

## 2020-08-16 NOTE — Patient Instructions (Signed)
I ordered an ultrasound of your left calf. We will try to get it as close to 1 pm as possible.

## 2020-08-16 NOTE — Progress Notes (Signed)
Acute Office Visit  Subjective:    Patient ID: Cheryl Abbott, female    DOB: 08/14/1945, 75 y.o.   MRN: [id number]  Chief Complaint  Patient presents with  . Leg Pain    R leg swelling, feels a "knot" in calf X 1 week    HPI Patient is in today for right leg/calf pain x 1 week. She rates her pain at 5/10 today.  She describes her pain as dull and she has some itching on the back of her leg.  Past Medical History:  Diagnosis Date  . Abnormal brain scan 02/07/2012  . Anemia   . Anxiety   . Anxiety disorder   . Arthritis   . Brain TIA 08/19/2015  . Cancer ([place])    acute myeloma  . 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 Chronic back pain   . Depression   . Fatty liver   . GERD (gastroesophageal reflux disease)   . Headache   . HEMORRHOIDS 03/15/2009   OCT 2014 HB 12. 9    . History of adenomatous polyp of colon    2008  . History of kidney stones   . Hypercalcemia   . Hypercalcemia 11/30/2010  . Hyperlipidemia   . Hypertension   . Lactose intolerance in adult 02/01/2016  . Left ureteral stone   . Lipoma of arm 01/19/2015  . Lumbar disc disease with radiculopathy   . Nephrolithiasis   . Neuropathic pain of both feet 04/11/2016  . Neuropathy, peripheral   . Nocturnal hypoxia 02/04/2014   Study confirms this done 02/10/2014   . OSA treated with BiPAP    per study 2007  . RENAL CALCULUS 03/15/2009   Qualifier: Diagnosis of  By: Zeb Comfort    . Sciatica   . Shoulder pain, bilateral   . Sigmoid diverticulosis   . TIA (transient ischemic attack) 02/26/2016  . Type 2 diabetes mellitus ([place])   . Vaginal dryness, menopausal 10/14/2016    Past Surgical History:  Procedure Laterality Date  . BIOPSY N/A 03/17/2013   Procedure: GASTRIC BIOPSIES;  Surgeon: Danie Binder, MD;  Location: [facility];  Service: Endoscopy;  Laterality: N/A;  . BIOPSY  06/10/2015   Procedure: BIOPSY;  Surgeon: Danie Binder, MD;  [facility];  Service: Endoscopy;;  gastric biopsy   . CARDIAC CATHETERIZATION  05-11-2003  dr Shelva Majestic ([facility])   Abnormal cardiolite/   Normal coronary arteries and normal LVF,  ef  63%  . CARDIOVASCULAR STRESS TEST  01-01-2014   normal lexiscan cardiolite/  no ischemia/ infarct/  normal LV function and wall motion ,  ef 81%  . COLONOSCOPY  last one 10/02/2011   [facility] [place] TICS, IH, NEXT TCS APR 2018  . COLONOSCOPY WITH PROPOFOL N/A 02/26/2017   Procedure: COLONOSCOPY WITH PROPOFOL;  Surgeon: Danie Binder, MD;  [facility];  Service: Endoscopy;  Laterality: N/A;  11:00am  . COLONOSCOPY WITH PROPOFOL N/A 09/22/2019   Procedure: COLONOSCOPY WITH PROPOFOL;  Surgeon: Danie Binder, MD;  [facility];  Service: Endoscopy;  Laterality: N/A;  1:15  . CYSTO/   RIGHT URETEROSOCOPY LASER LITHOTRIPSY STONE EXTRACTION  10-13-2004  . CYSTO/  RIGHT RETROGRADE PYELOGRAM/  PLACMENT RIGHT URETERAL STENT  01-10-2010  . CYSTOSCOPY W/ URETERAL STENT PLACEMENT Right 08/19/2015   Procedure: CYSTOSCOPY WITH RETROGRADE PYELOGRAM/URETERAL STENT PLACEMENT;  Surgeon: Franchot Gallo, MD;  Location: [facility];  Service: Urology;  Laterality: Right;  . CYSTOSCOPY W/  URETERAL STENT PLACEMENT Left 02/23/2020   Procedure: CYSTOSCOPY LEFT  RETROGRADE PYELOGRAM LEFT URETEROSCOPY URETERAL STENT PLACEMENT;  Surgeon: Franchot Gallo, MD;  Location: [facility];  Service: Urology;  Laterality: Left;  . CYSTOSCOPY WITH RETROGRADE PYELOGRAM, URETEROSCOPY AND STENT PLACEMENT Left 10/21/2014   Procedure: CYSTOSCOPY WITH RETROGRADE PYELOGRAM, URETEROSCOPY AND STENT PLACEMENT;  Surgeon: Franchot Gallo, MD;  Location: [facility];  Service: Urology;  Laterality: Left;  . CYSTOSCOPY WITH RETROGRADE PYELOGRAM, URETEROSCOPY AND STENT PLACEMENT Right 11/22/2015   Procedure: CYSTOSCOPY, RIGHT URETERAL STENT REMOVAL; RIGHT RETROGRADE PYELOGRAM, RIGHT URETEROSCOPY WITH BALLOON DILATION; RIGHT URETERAL STENT PLACEMENT;  Surgeon: Franchot Gallo, MD;  Location: [facility];  Service: Urology;  Laterality: Right;  . CYSTOSCOPY/RETROGRADE/URETEROSCOPY/STONE EXTRACTION WITH BASKET Bilateral 08/02/2015   Procedure: CYSTOSCOPY; BILATERAL RETROGRADE PYELOGRAMS; BILATERAL URETEROSCOPY, STONE EXTRACTION WITH BASKET;  Surgeon: Franchot Gallo, MD;  Location: [facility];  Service: Urology;  Laterality: Bilateral;  . CYSTOSCOPY/RETROGRADE/URETEROSCOPY/STONE EXTRACTION WITH BASKET Right 06/28/2015   Procedure: CYSTOSCOPY/RETROGRADE/URETEROSCOPY/STONE EXTRACTION WITH BASKET, RIGHT URETERAL DOUBLE J STENT PLACEMENT;  Surgeon: Franchot Gallo, MD;  [facility];  Service: Urology;  Laterality: Right;  . ESOPHAGOGASTRODUODENOSCOPY (EGD) WITH PROPOFOL N/A 03/17/2013   Procedure: ESOPHAGOGASTRODUODENOSCOPY (EGD) WITH PROPOFOL;  Surgeon: Danie Binder, MD;  [facility]: [facility];  Service: Endoscopy;  Laterality: N/A;  . ESOPHAGOGASTRODUODENOSCOPY (EGD) WITH PROPOFOL N/A 06/10/2015   Distal gastritis, distal esophageal stricture s/p dilation  . ESOPHAGOGASTRODUODENOSCOPY (EGD) WITH PROPOFOL N/A 02/26/2017   Procedure: ESOPHAGOGASTRODUODENOSCOPY (EGD) WITH PROPOFOL;  Surgeon: Danie Binder, MD;  [facility];  Service: Endoscopy;  Laterality: N/A;  . FLEXIBLE SIGMOIDOSCOPY  09/11/2011   TOI:[id number] Internal hemorrhoids  . HOLMIUM LASER APPLICATION Left 9/83/3825   Procedure: HOLMIUM LASER APPLICATION;  Surgeon: Franchot Gallo, MD;  Location: [facility];  Service: Urology;  Laterality: Left;  . HOLMIUM LASER APPLICATION Right 0/53/9767   Procedure: HOLMIUM LASER APPLICATION;  Surgeon: Franchot Gallo, MD;  [facility]: [facility];  Service: Urology;  Laterality: Right;  . HOLMIUM LASER APPLICATION  3/41/9379   Procedure: HOLMIUM LASER APPLICATION;  Surgeon: Franchot Gallo, MD;  Location: [facility];  Service: Urology;;  . MASS EXCISION Left 03/04/2015   Procedure: EXCISION OF SOFT TISSUE NEOPLASM LEFT ARM;  Surgeon: Aviva Signs,  MD;  Location: [facility];  Service: General;  Laterality: Left;  . PERCUTANEOUS NEPHROSTOLITHOTOMY Bilateral 12/  2011     [facility]  . POLYPECTOMY N/A 03/17/2013   Procedure: GASTRIC POLYPECTOMY;  Surgeon: Danie Binder, MD;  Location: [facility];  Service: Endoscopy;  Laterality: N/A;  . POLYPECTOMY  02/26/2017   Procedure: POLYPECTOMY;  Surgeon: Danie Binder, MD;  [facility];  Service: Endoscopy;;  polyp at cecum, ascending colon polyps x3, hepatic flexure polyps x8, transverse colon polyps x8   . POLYPECTOMY  09/22/2019   Procedure: POLYPECTOMY;  Surgeon: Danie Binder, MD;  [facility];  Service: Endoscopy;;  . REMOVAL RIGHT THIGH CYST  2006  . SAVORY DILATION N/A 03/17/2013   Procedure: SAVORY DILATION;  Surgeon: Danie Binder, MD;  Location: [facility];  Service: Endoscopy;  Laterality: N/A;  #12.8, 14, 15, 16 dilators used  . SAVORY DILATION N/A 06/10/2015   Procedure: SAVORY DILATION;  Surgeon: Danie Binder, MD;  [facility];  Service: Endoscopy;  Laterality: N/A;  . TRANSTHORACIC ECHOCARDIOGRAM  01-01-2014   mild LVH/  ef 02-40%/  grade I diastolic dysfunction/  trivial MR, TR, and PR  . VAGINAL HYSTERECTOMY  1970's    Family History  Problem Relation Age of Onset  . Hypertension Mother   . Diabetes Mother   . Heart failure Mother   . Dementia Mother   . Emphysema Father   . Hypertension Father   . Diabetes Brother   . GER disease Brother   . Hypertension Sister   . Hypertension Sister   . Cancer Other        family history   . Diabetes Other        family history   . Heart defect Other        famiily history   . Arthritis Other        family history   . Anesthesia problems Neg Hx   . Hypotension Neg Hx   . Malignant hyperthermia Neg Hx   . Pseudochol deficiency Neg Hx   . Colon cancer Neg Hx     Social History   Socioeconomic History  . Marital status: Divorced    Spouse name: Not on file  . Number of children: 2  . Years of  education: Not on file  . Highest education level: Not on file  Occupational History  . Occupation: retired   Tobacco Use  . Smoking status: Former Smoker    Packs/day: 1.00    Years: 20.00    Pack years: 20.00    Types: Cigarettes    Start date: 09/21/1974    Quit date: 09/20/1988    Years since quitting: 31.9  . Smokeless tobacco: Never Used  . Tobacco comment: Quit x 20 years  Vaping Use  . Vaping Use: Never used  Substance and Sexual Activity  . Alcohol use: No    Alcohol/week: 0.0 standard drinks  . Drug use: No  . Sexual activity: Never    Birth control/protection: Surgical  Other Topics Concern  . Not on file  Social History Narrative   Patient is right handed   Patient drinks some caffeine daily.   Social Determinants of Health   Financial Resource Strain: Low Risk   . Difficulty of Paying Living Expenses: Not very hard  Food Insecurity: No Food Insecurity  . Worried About Charity fundraiser in the Last Year: Never true  . Ran Out of Food in the Last Year: Never true  Transportation Needs: No Transportation Needs  . Lack of Transportation (Medical): No  . Lack of Transportation (Non-Medical): No  Physical Activity: Inactive  . Days of Exercise per Week: 0 days  . Minutes of Exercise per Session: 0 min  Stress: Stress Concern Present  . Feeling of Stress : To some extent  Social Connections: Moderately Isolated  . Frequency of Communication with Friends and Family: More than three times a week  . Frequency of Social Gatherings with Friends and Family: More than three times a week  . Attends Religious Services: More than 4 times per year  . Active Member of Clubs or Organizations: No  . Attends Archivist Meetings: Never  . Marital Status: Divorced  Human resources officer Violence: Not At Risk  . Fear of Current or Ex-Partner: No  . Emotionally Abused: No  . Physically Abused: No  . Sexually Abused: No    Outpatient Medications Prior to Visit   Medication Sig Dispense Refill  . acetaminophen (TYLENOL) 500 MG tablet Take 1,000 mg by mouth every 6 (six) hours as needed for moderate pain.     Marland Kitchen amLODipine (NORVASC) 10 MG tablet TAKE 1 TABLET BY MOUTH  EVERY DAY 90 tablet 1  . aspirin EC 81 MG tablet Take 81 mg by mouth daily.     . betamethasone dipropionate 0.05 % cream Apply topically 2 (two) times daily. Apply twice daily to rash on arms and legs for 10 days 45 g 0  . budesonide-formoterol (SYMBICORT) 80-4.5 MCG/ACT inhaler INHALE 2 PUFFS INTO THE LUNGS TWICE A DAY 30.6 each 3  . Cholecalciferol (VITAMIN D3) 50 MCG (2000 UT) TABS Take 2,000 Units by mouth daily.    . diclofenac Sodium (VOLTAREN) 1 % GEL APPLY 4 G TOPICALLY 4 (FOUR) TIMES DAILY. 400 g 5  . diphenhydramine-acetaminophen (TYLENOL PM) 25-500 MG TABS tablet Take 2 tablets by mouth at bedtime.    Mariane Baumgarten Sodium (DSS) 100 MG CAPS Take by mouth.     . gabapentin (NEURONTIN) 100 MG capsule TAKE ONE TO TWO CAPSULES ART BEDTIME FOR NERVE PAIN 60 capsule 3  . GAVILYTE-G 236 g solution USE AS DIRECTED.O    . glucose blood (ONETOUCH ULTRA) test strip Use as instructed once daily dx e11.9 100 each 5  . iron polysaccharides (NIFEREX) 150 MG capsule Take by mouth.     . magnesium citrate SOLN Take 0.5 Bottles by mouth every 3 (three) days.    . methocarbamol (ROBAXIN) 750 MG tablet Take 1 tablet (750 mg total) by mouth 4 (four) times daily. 60 tablet 2  . OneTouch Delica Lancets 43X MISC Once daily testing dx e11.9 100 each 5  . pantoprazole (PROTONIX) 40 MG tablet TAKE 1 TABLET (40 MG TOTAL) BY MOUTH 2 (TWO) TIMES DAILY. 180 tablet 1  . PARoxetine (PAXIL) 40 MG tablet TAKE 1 TABLET BY MOUTH EVERY DAY IN THE MORNING 90 tablet 1  . rosuvastatin (CRESTOR) 20 MG tablet Take 1 tablet (20 mg total) by mouth daily. 90 tablet 3  . TRADJENTA 5 MG TABS tablet TAKE 1 TABLET BY MOUTH EVERY DAY 90 tablet 1  . traMADol (ULTRAM) 50 MG tablet TAKE 1 TABLET BY MOUTH EVERY 6 HOURS AS NEEDED FOR  PAIN 60 tablet 0   No facility-administered medications prior to visit.    Allergies  Allergen Reactions  . Ace Inhibitors Cough  . Keflex [Cephalexin] Diarrhea and Nausea And Vomiting  . Nitrofurantoin Diarrhea and Nausea And Vomiting  . Penicillins Hives and Other (See Comments)    Reaction:  Blisters on hands/feet  Has patient had a PCN reaction causing immediate rash, facial/tongue/throat swelling, SOB or lightheadedness with hypotension: No Has patient had a PCN reaction causing severe rash involving mucus membranes or skin necrosis: No Has patient had a PCN reaction that required hospitalization No Has patient had a PCN reaction occurring within the last 10 years: No If all of the above answers are "NO", then may proceed with Cephalosporin use.    Review of Systems  Constitutional: Negative.   Respiratory: Negative.   Cardiovascular:       Varicose veins to bilateral legs; swelling to right calf       Objective:    Physical Exam Constitutional:      Appearance: Normal appearance.  Cardiovascular:     Rate and Rhythm: Normal rate and regular rhythm.     Pulses: Normal pulses.     Heart sounds: Normal heart sounds.  Pulmonary:     Effort: Pulmonary effort is normal.     Breath sounds: Normal breath sounds.  Musculoskeletal:        General: Swelling present.     Comments: Swelling and varicose  veins to right leg/calf  Neurological:     Mental Status: She is alert.     BP 124/72   Pulse 89   Temp 98.4 F (36.9 C)   Resp 20   Ht 5' 6.5" (1.689 m)   Wt 193 lb (87.5 kg)   SpO2 91%   BMI 30.68 kg/m  Wt Readings from Last 3 Encounters:  08/16/20 193 lb (87.5 kg)  06/23/20 188 lb (85.3 kg)  05/19/20 192 lb 0.6 oz (87.1 kg)    There are no preventive care reminders to display for this patient.  There are no preventive care reminders to display for this patient.   Lab Results  Component Value Date   TSH 2.810 05/13/2020   Lab Results  Component Value  Date   WBC 8.1 02/01/2020   HGB 11.0 (L) 02/01/2020   HCT 36.5 02/01/2020   MCV 91.3 02/01/2020   PLT 383 02/01/2020   Lab Results  Component Value Date   NA 138 05/13/2020   K 4.5 05/13/2020   CO2 22 05/13/2020   GLUCOSE 99 05/13/2020   BUN 17 05/13/2020   CREATININE 1.44 (H) 05/13/2020   BILITOT <0.2 05/13/2020   ALKPHOS 102 05/13/2020   AST 26 05/13/2020   ALT 30 05/13/2020   PROT 8.6 (H) 05/13/2020   ALBUMIN 4.5 05/13/2020   CALCIUM 10.7 (H) 05/13/2020   ANIONGAP 9 02/01/2020   Lab Results  Component Value Date   CHOL 163 05/13/2020   Lab Results  Component Value Date   HDL 49 05/13/2020   Lab Results  Component Value Date   LDLCALC 93 05/13/2020   Lab Results  Component Value Date   TRIG 120 05/13/2020   Lab Results  Component Value Date   CHOLHDL 3.3 05/13/2020   Lab Results  Component Value Date   HGBA1C 6.4 (H) 05/13/2020       Assessment & Plan:   Problem List Items Addressed This Visit      Other   Right calf pain    -started 1 week ago, and she has some swelling on exam; varicose veins nearby -will get U/S to r/o DVT -we discussed vascular consult for varicose veins, but she is not interested at this time      Relevant Orders   US Venous Img Lower Unilateral Right       No orders of the defined types were placed in this encounter.    Noreene Larsson, NP

## 2020-08-16 NOTE — Assessment & Plan Note (Signed)
-  started 1 week ago, and she has some swelling on exam; varicose veins nearby -will get U/S to r/o DVT -we discussed vascular consult for varicose veins, but she is not interested at this time

## 2020-08-17 ENCOUNTER — Ambulatory Visit (HOSPITAL_COMMUNITY)
Admission: RE | Admit: 2020-08-17 | Discharge: 2020-08-17 | Disposition: A | Discharge status: Home / Self Care | Payer: Medicare Other | Source: Ambulatory Visit | Attending: Neurosurgery | Admitting: Neurosurgery

## 2020-08-17 DIAGNOSIS — M5412 Radiculopathy, cervical region: Secondary | ICD-10-CM

## 2020-08-17 DIAGNOSIS — M542 Cervicalgia: Secondary | ICD-10-CM | POA: Diagnosis not present

## 2020-08-17 DIAGNOSIS — R0902 Hypoxemia: Secondary | ICD-10-CM | POA: Diagnosis not present

## 2020-08-20 IMAGING — DX DG CHEST 2V
2 series · 2 of 2 positions shown · non-contrast
Comparison: 02/17/2018

CLINICAL DATA: Short of breath and fatigue for weeks. History of
multiple myeloma.

EXAM:
CHEST - 2 VIEW

[chest lat]
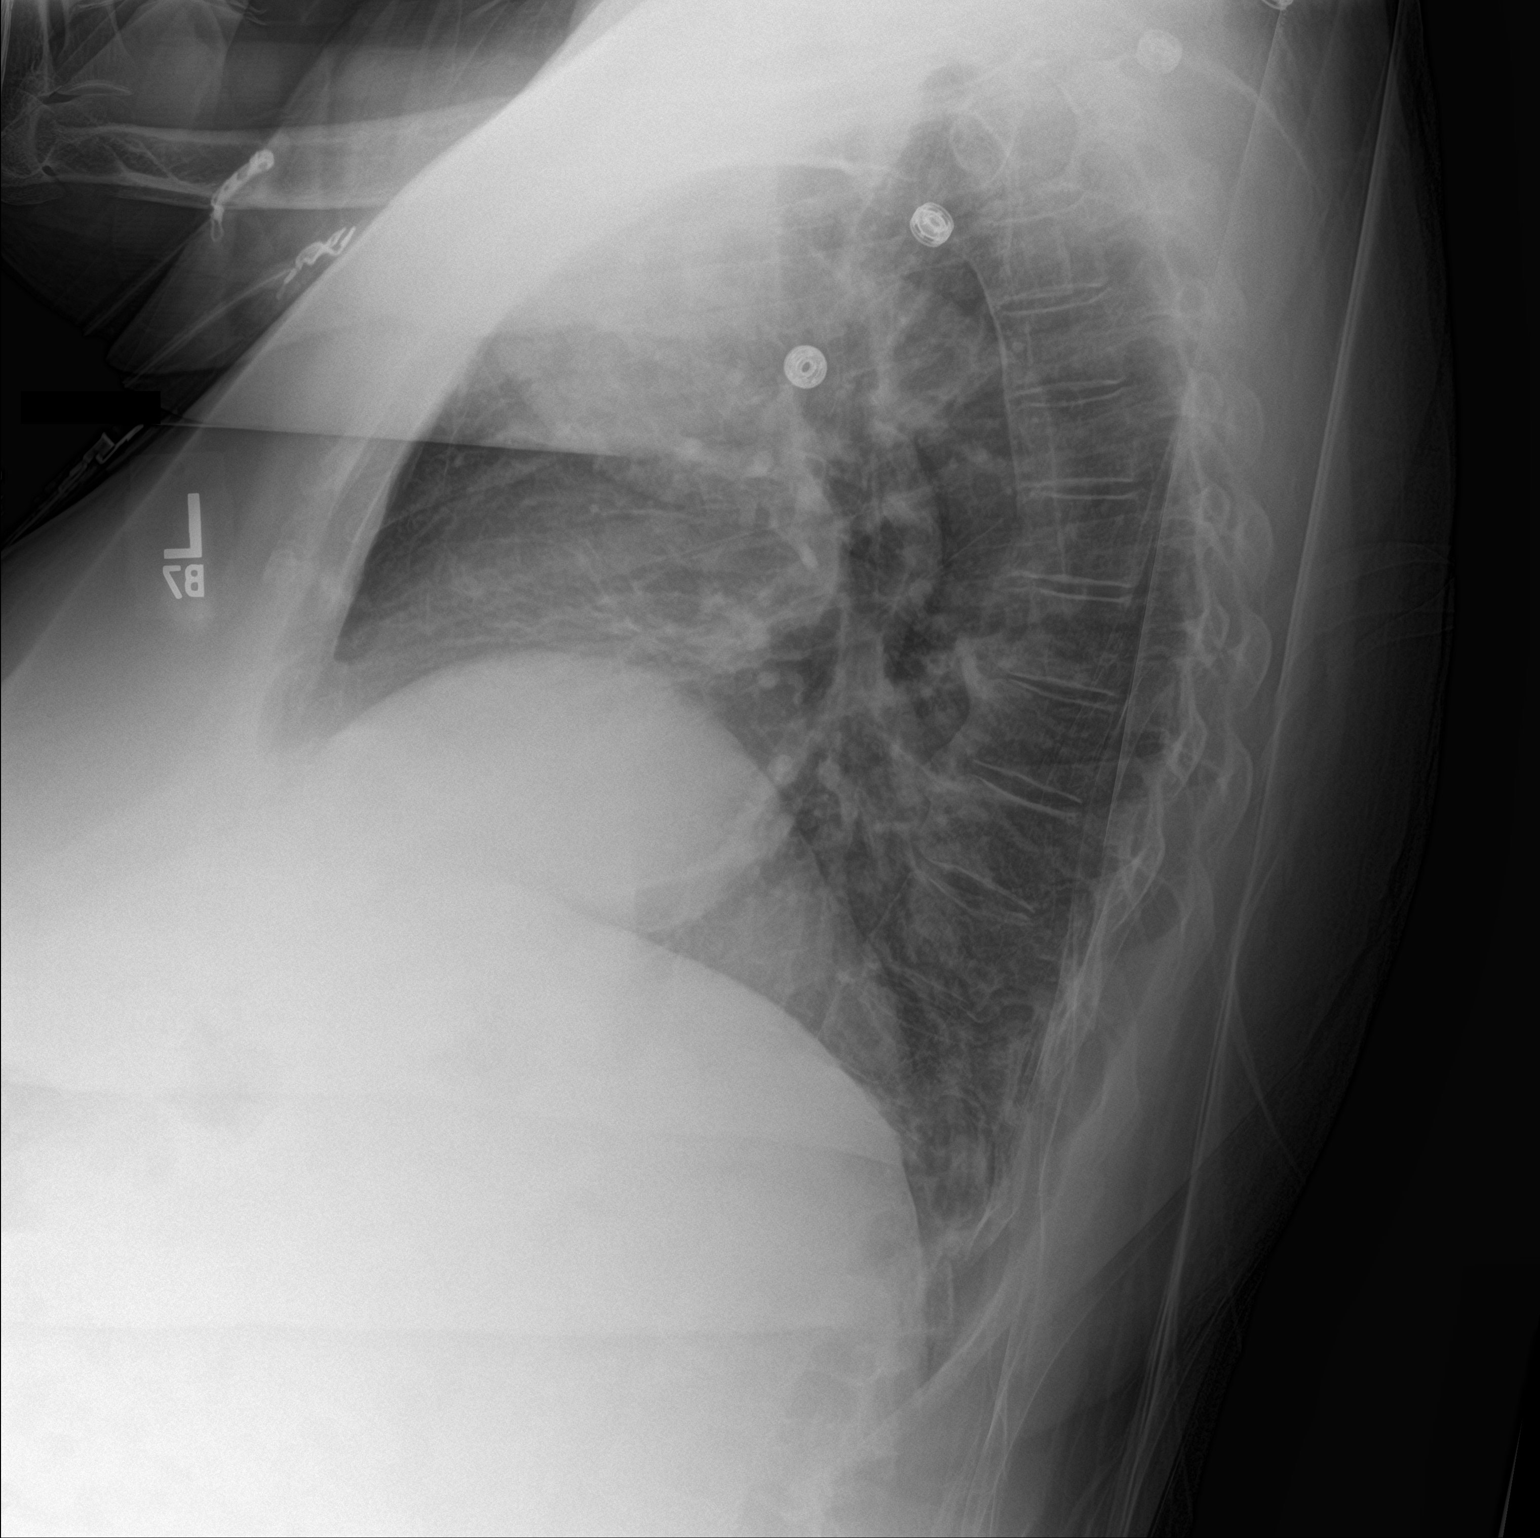

[chest ap]
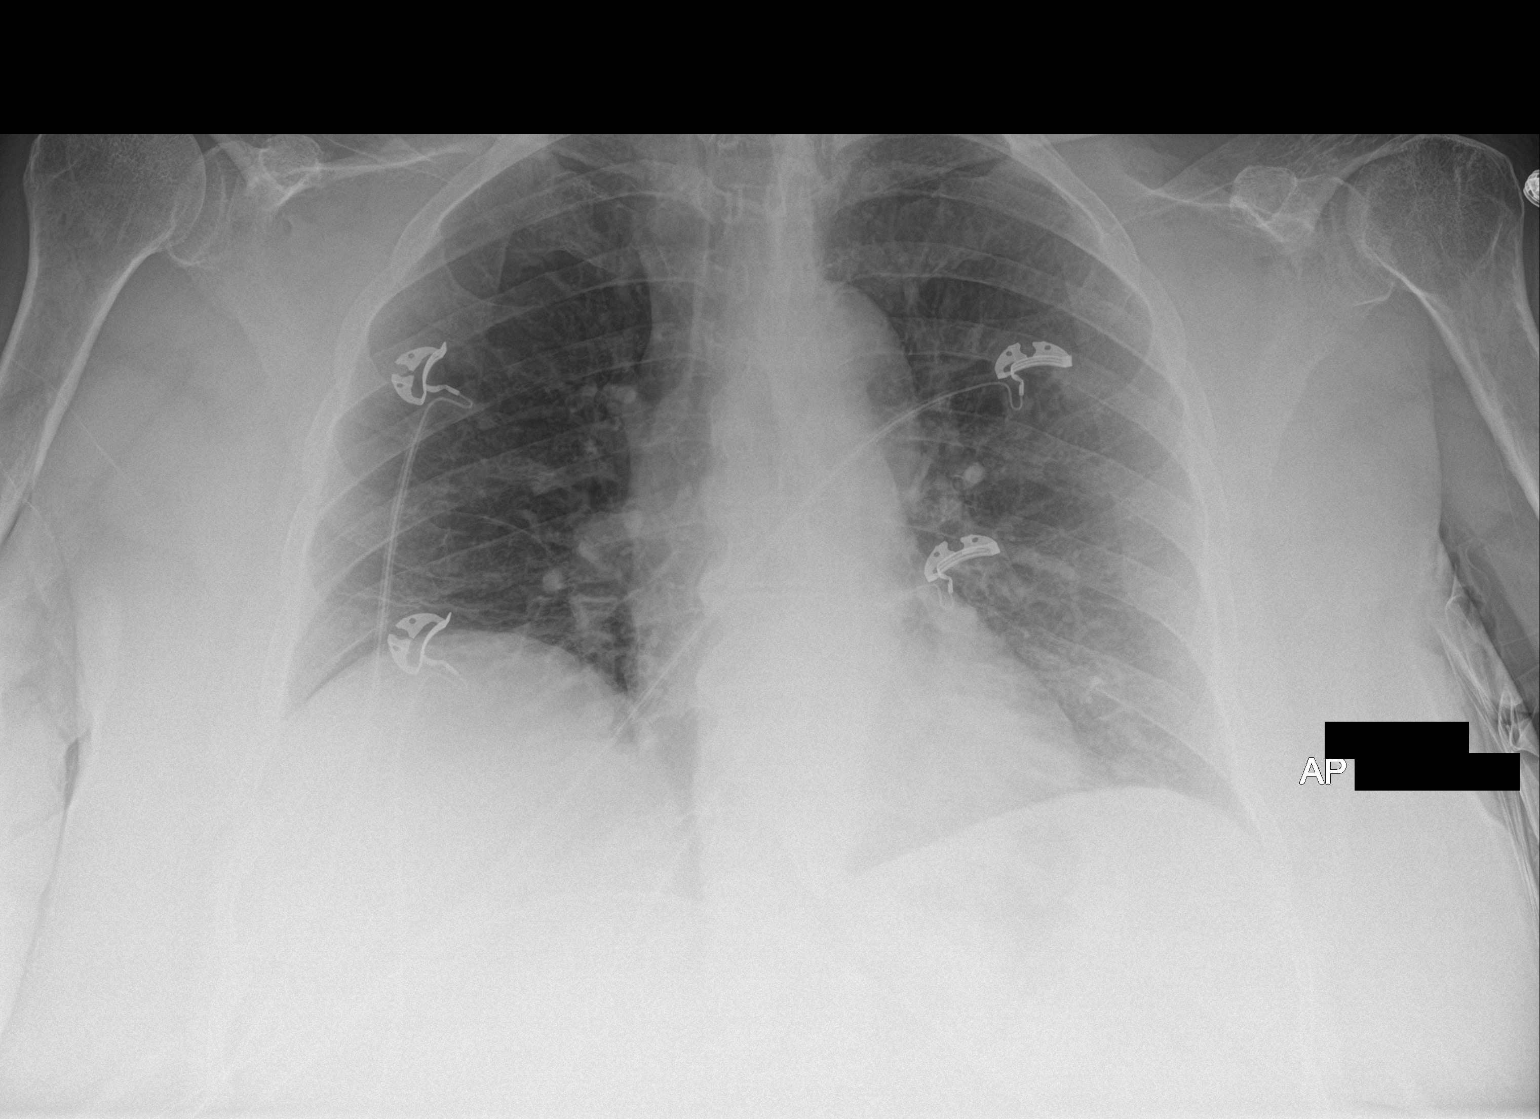

[2 of 2 positions shown; findings below may reference images not displayed]

FINDINGS: Cardiac silhouette normal in size and configuration. No mediastinal
or hilar masses. No evidence of adenopathy.

Mild linear scarring at the right lung base. Lungs otherwise clear.
No pleural effusion or pneumothorax.

Skeletal structures are demineralized, but intact.
IMPRESSION: No active cardiopulmonary disease.

## 2020-08-22 ENCOUNTER — Other Ambulatory Visit: Payer: Self-pay

## 2020-08-22 ENCOUNTER — Inpatient Hospital Stay (HOSPITAL_COMMUNITY): Payer: Medicare Other | Attending: Hematology

## 2020-08-22 DIAGNOSIS — D509 Iron deficiency anemia, unspecified: Secondary | ICD-10-CM | POA: Diagnosis not present

## 2020-08-22 DIAGNOSIS — C9000 Multiple myeloma not having achieved remission: Secondary | ICD-10-CM | POA: Insufficient documentation

## 2020-08-22 DIAGNOSIS — I129 Hypertensive chronic kidney disease with stage 1 through stage 4 chronic kidney disease, or unspecified chronic kidney disease: Secondary | ICD-10-CM | POA: Diagnosis not present

## 2020-08-22 DIAGNOSIS — R61 Generalized hyperhidrosis: Secondary | ICD-10-CM | POA: Diagnosis not present

## 2020-08-22 DIAGNOSIS — N1832 Chronic kidney disease, stage 3b: Secondary | ICD-10-CM | POA: Diagnosis not present

## 2020-08-22 DIAGNOSIS — Z87891 Personal history of nicotine dependence: Secondary | ICD-10-CM | POA: Diagnosis not present

## 2020-08-22 DIAGNOSIS — R5383 Other fatigue: Secondary | ICD-10-CM | POA: Insufficient documentation

## 2020-08-22 DIAGNOSIS — R110 Nausea: Secondary | ICD-10-CM | POA: Diagnosis not present

## 2020-08-22 DIAGNOSIS — Z87442 Personal history of urinary calculi: Secondary | ICD-10-CM | POA: Diagnosis not present

## 2020-08-22 DIAGNOSIS — M898X6 Other specified disorders of bone, lower leg: Secondary | ICD-10-CM | POA: Diagnosis not present

## 2020-08-22 DIAGNOSIS — D472 Monoclonal gammopathy: Secondary | ICD-10-CM

## 2020-08-22 LAB — CBC WITH DIFFERENTIAL/PLATELET
Abs Immature Granulocytes: 0.03 10*3/uL (ref 0.00–0.07)
Basophils Absolute: 0.1 10*3/uL (ref 0.0–0.1)
Basophils Relative: 1 %
Eosinophils Absolute: 0.4 10*3/uL (ref 0.0–0.5)
Eosinophils Relative: 6 %
HCT: 42 % (ref 36.0–46.0)
Hemoglobin: 13.5 g/dL (ref 12.0–15.0)
Immature Granulocytes: 0 %
Lymphocytes Relative: 18 %
Lymphs Abs: 1.4 10*3/uL (ref 0.7–4.0)
MCH: 31.5 pg (ref 26.0–34.0)
MCHC: 32.1 g/dL (ref 30.0–36.0)
MCV: 98.1 fL (ref 80.0–100.0)
Monocytes Absolute: 0.5 10*3/uL (ref 0.1–1.0)
Monocytes Relative: 6 %
Neutro Abs: 5.1 10*3/uL (ref 1.7–7.7)
Neutrophils Relative %: 69 %
Platelets: 279 10*3/uL (ref 150–400)
RBC: 4.28 MIL/uL (ref 3.87–5.11)
RDW: 15.4 % (ref 11.5–15.5)
WBC: 7.4 10*3/uL (ref 4.0–10.5)
nRBC: 0 % (ref 0.0–0.2)

## 2020-08-22 LAB — COMPREHENSIVE METABOLIC PANEL
ALT: 20 U/L (ref 0–44)
AST: 20 U/L (ref 15–41)
Albumin: 4.1 g/dL (ref 3.5–5.0)
Alkaline Phosphatase: 73 U/L (ref 38–126)
Anion gap: 10 (ref 5–15)
BUN: 20 mg/dL (ref 8–23)
CO2: 25 mmol/L (ref 22–32)
Calcium: 10 mg/dL (ref 8.9–10.3)
Chloride: 103 mmol/L (ref 98–111)
Creatinine, Ser: 2.18 mg/dL — ABNORMAL HIGH (ref 0.44–1.00)
GFR, Estimated: 23 mL/min — ABNORMAL LOW (ref >60)
Glucose, Bld: 141 mg/dL — ABNORMAL HIGH (ref 70–99)
Potassium: 4.5 mmol/L (ref 3.5–5.1)
Sodium: 138 mmol/L (ref 135–145)
Total Bilirubin: 0.5 mg/dL (ref 0.3–1.2)
Total Protein: 9 g/dL — ABNORMAL HIGH (ref 6.5–8.1)

## 2020-08-22 LAB — IRON AND TIBC
Iron: 109 ug/dL (ref 28–170)
Saturation Ratios: 23 % (ref 10.4–31.8)
TIBC: 477 ug/dL — ABNORMAL HIGH (ref 250–450)
UIBC: 368 ug/dL

## 2020-08-22 LAB — LACTATE DEHYDROGENASE: LDH: 123 U/L (ref 98–192)

## 2020-08-22 LAB — FERRITIN: Ferritin: 19 ng/mL (ref 11–307)

## 2020-08-23 LAB — PROTEIN ELECTROPHORESIS, SERUM
A/G Ratio: 0.9 (ref 0.7–1.7)
Albumin ELP: 4 g/dL (ref 2.9–4.4)
Alpha-1-Globulin: 0.2 g/dL (ref 0.0–0.4)
Alpha-2-Globulin: 1 g/dL (ref 0.4–1.0)
Beta Globulin: 1.3 g/dL (ref 0.7–1.3)
Gamma Globulin: 2 g/dL — ABNORMAL HIGH (ref 0.4–1.8)
Globulin, Total: 4.5 g/dL — ABNORMAL HIGH (ref 2.2–3.9)
M-Spike, %: 1.2 g/dL — ABNORMAL HIGH
Total Protein ELP: 8.5 g/dL (ref 6.0–8.5)

## 2020-08-23 LAB — KAPPA/LAMBDA LIGHT CHAINS
Kappa free light chain: 51.4 mg/L — ABNORMAL HIGH (ref 3.3–19.4)
Kappa, lambda light chain ratio: 3.04 — ABNORMAL HIGH (ref 0.26–1.65)
Lambda free light chains: 16.9 mg/L (ref 5.7–26.3)

## 2020-08-24 LAB — IMMUNOFIXATION ELECTROPHORESIS
IgA: 215 mg/dL (ref 64–422)
IgG (Immunoglobin G), Serum: 2141 mg/dL — ABNORMAL HIGH (ref 586–1602)
IgM (Immunoglobulin M), Srm: 126 mg/dL (ref 26–217)
Total Protein ELP: 8.5 g/dL (ref 6.0–8.5)

## 2020-08-27 IMAGING — DX DG BONE SURVEY MET
9 of 10 series · 9 of 10 positions shown · non-contrast
Comparison: Bone survey 02/15/2017

CLINICAL DATA: Monoclonal paraproteinemia

EXAM:
METASTATIC BONE SURVEY

[skull lat]
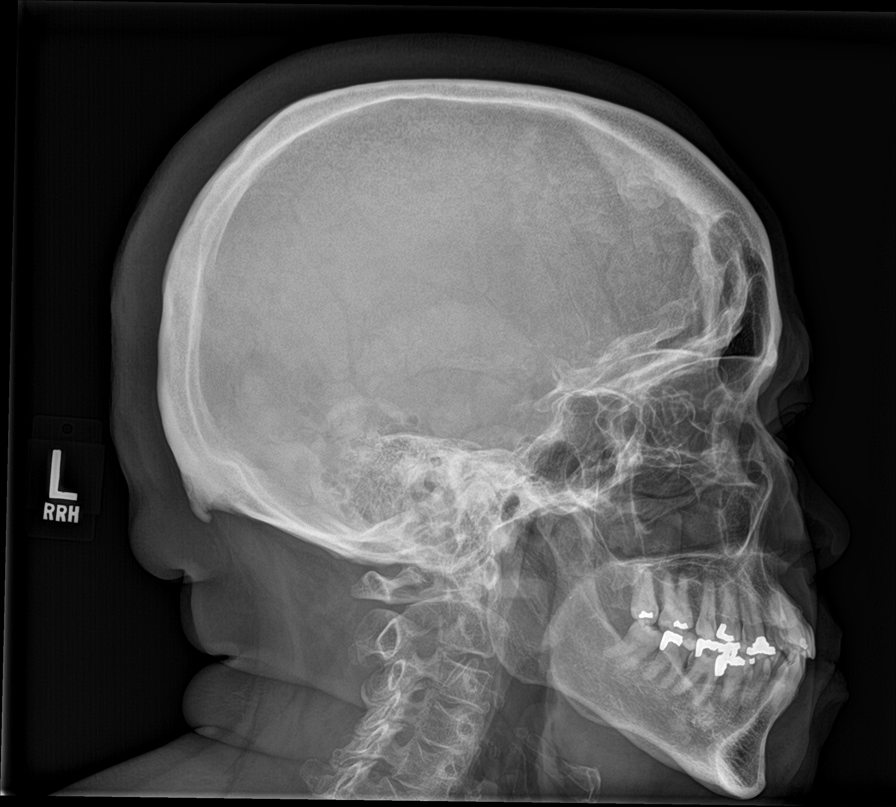

[shoulder ap (1 of 2)]
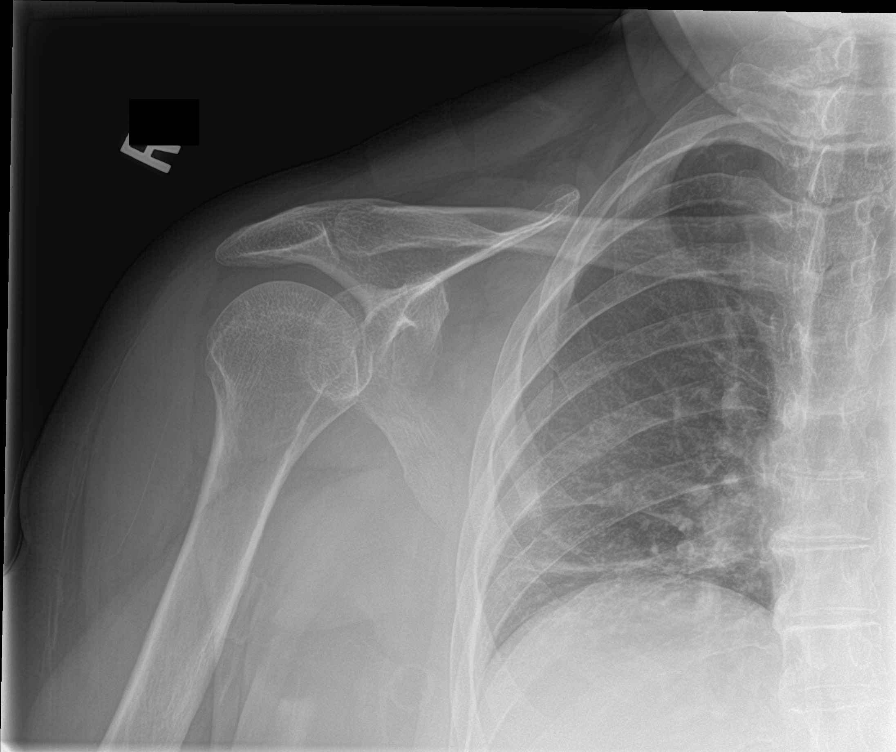

[shoulder ap (2 of 2)]
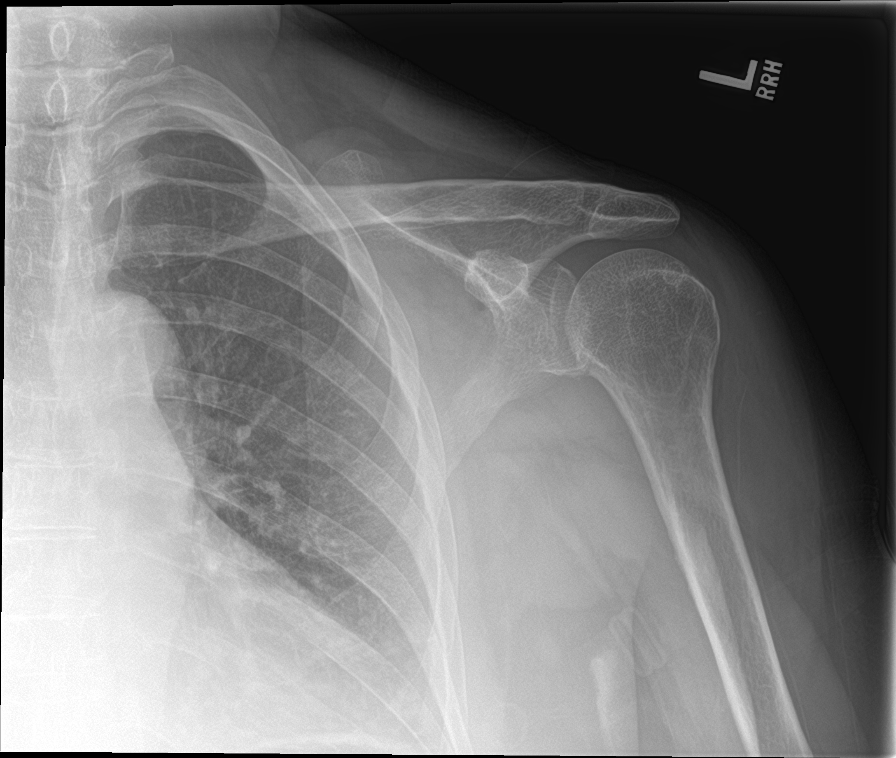

[humerus ap (1 of 2)]
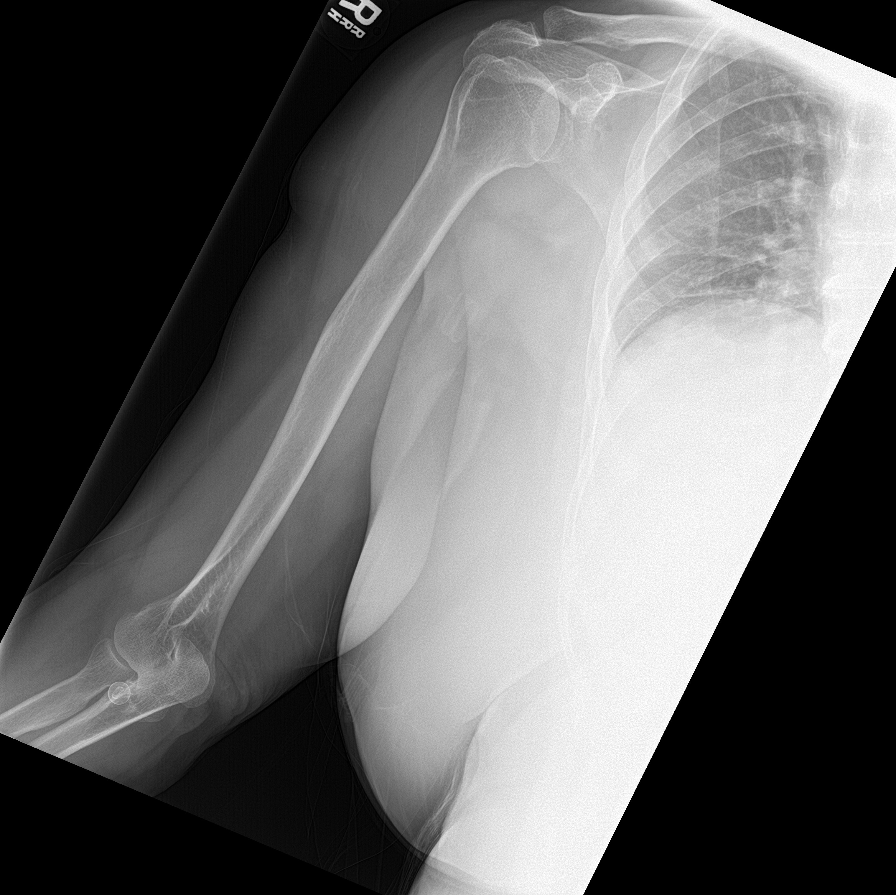

[humerus ap (2 of 2)]
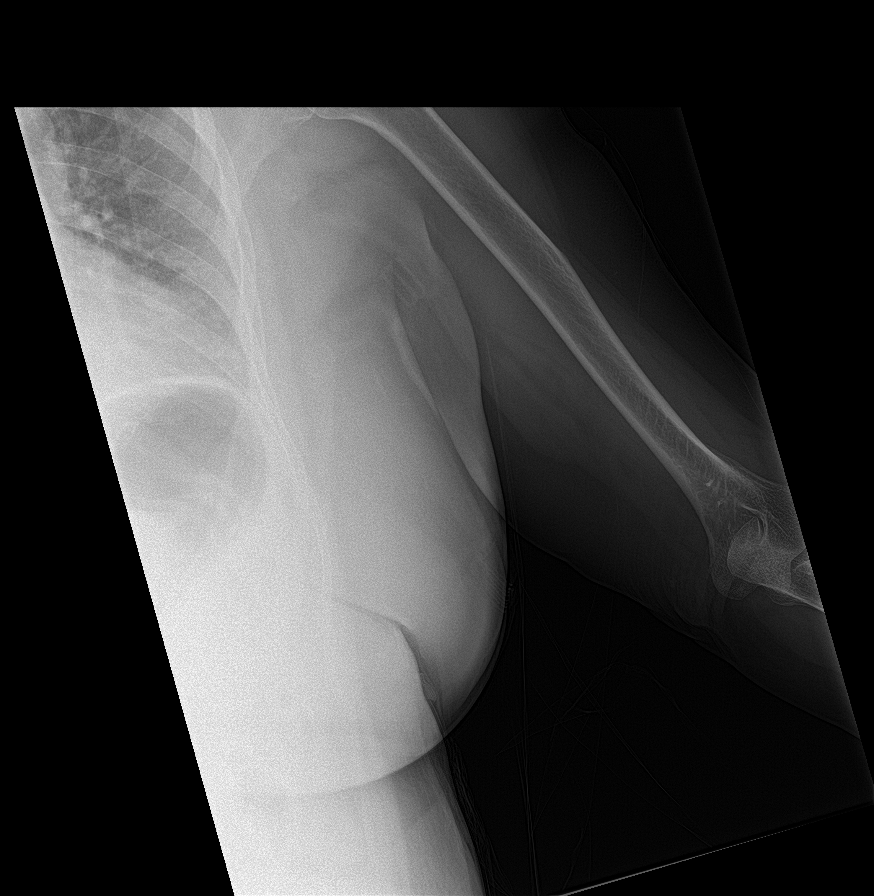

[forearm ap (1 of 2)]
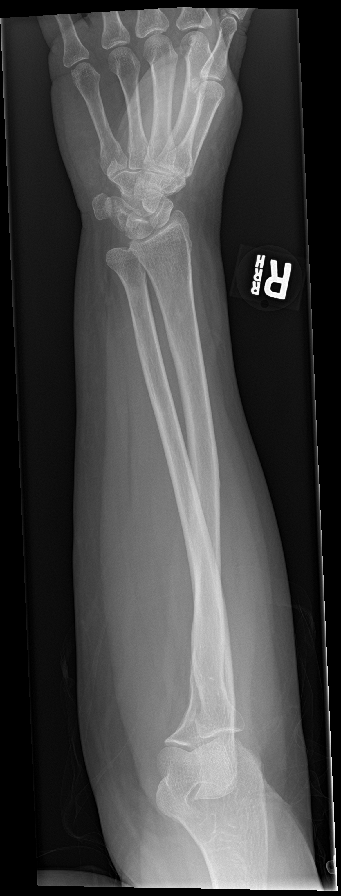

[forearm ap (2 of 2)]
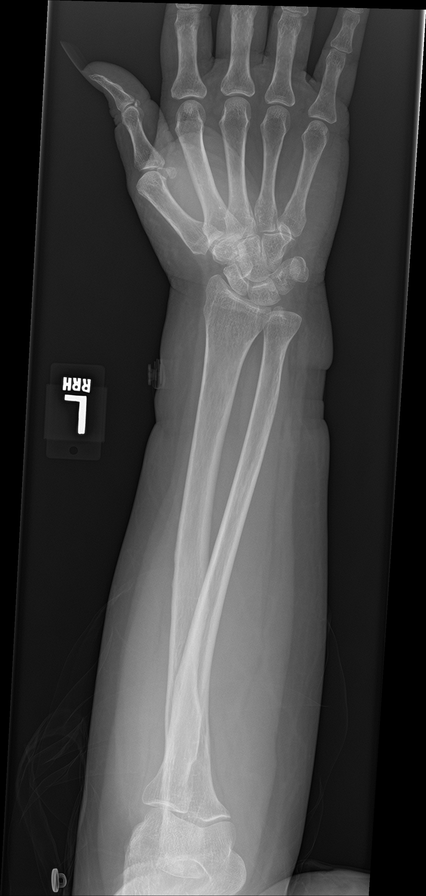

[c-spine ap]
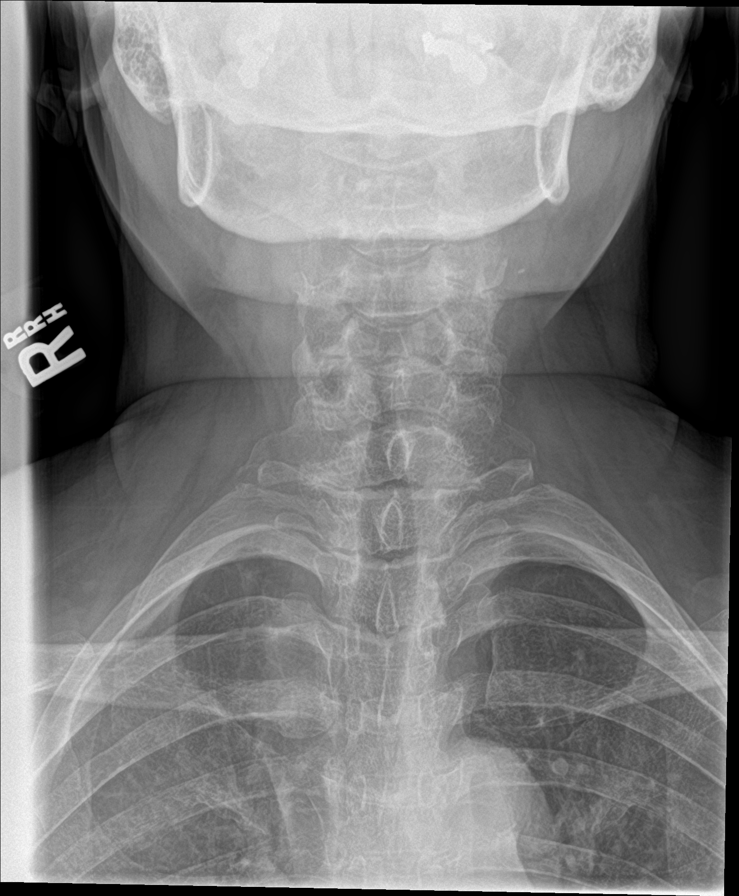

[c-spine lat]
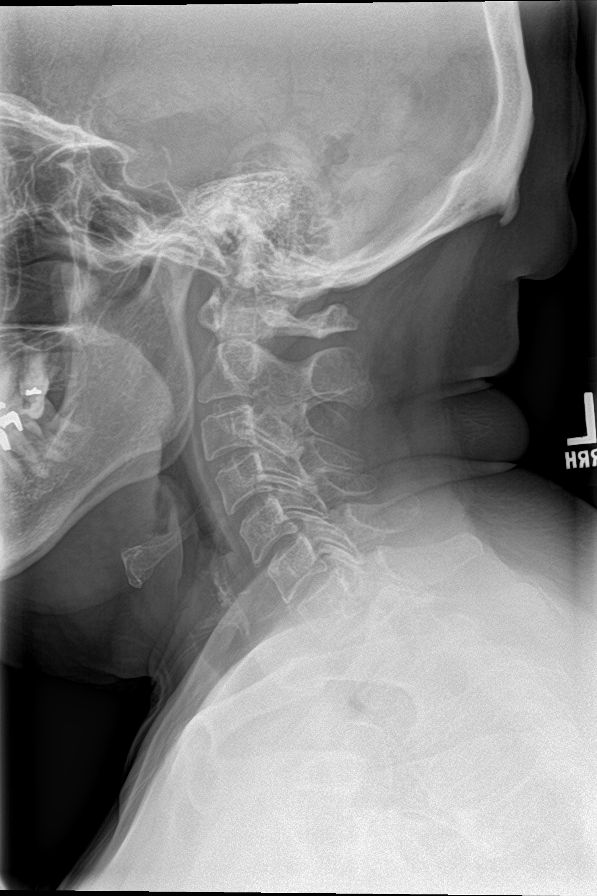

[9 of 10 positions shown; findings below may reference images not displayed]

FINDINGS: No lytic or sclerotic lesions are present.  No acute fracture.

Mild lumbar spine degenerative change.
IMPRESSION: No lytic bone lesions identified.

## 2020-08-29 ENCOUNTER — Ambulatory Visit (HOSPITAL_COMMUNITY): Payer: Medicare Other | Admitting: Hematology

## 2020-08-30 ENCOUNTER — Other Ambulatory Visit: Payer: Self-pay

## 2020-08-30 ENCOUNTER — Ambulatory Visit (HOSPITAL_COMMUNITY)
Admission: RE | Admit: 2020-08-30 | Discharge: 2020-08-30 | Disposition: A | Discharge status: Home / Self Care | Payer: Medicare Other | Source: Ambulatory Visit | Attending: Physician Assistant | Admitting: Physician Assistant

## 2020-08-30 ENCOUNTER — Inpatient Hospital Stay (HOSPITAL_BASED_OUTPATIENT_CLINIC_OR_DEPARTMENT_OTHER): Payer: Medicare Other | Admitting: Physician Assistant

## 2020-08-30 VITALS — BP 119/83 | HR 93 | Temp 96.9°F | Resp 18 | Wt 196.8 lb

## 2020-08-30 DIAGNOSIS — I129 Hypertensive chronic kidney disease with stage 1 through stage 4 chronic kidney disease, or unspecified chronic kidney disease: Secondary | ICD-10-CM | POA: Diagnosis not present

## 2020-08-30 DIAGNOSIS — C9000 Multiple myeloma not having achieved remission: Secondary | ICD-10-CM | POA: Diagnosis not present

## 2020-08-30 DIAGNOSIS — R61 Generalized hyperhidrosis: Secondary | ICD-10-CM | POA: Diagnosis not present

## 2020-08-30 DIAGNOSIS — D472 Monoclonal gammopathy: Secondary | ICD-10-CM

## 2020-08-30 DIAGNOSIS — M79604 Pain in right leg: Secondary | ICD-10-CM | POA: Diagnosis not present

## 2020-08-30 DIAGNOSIS — M898X6 Other specified disorders of bone, lower leg: Secondary | ICD-10-CM | POA: Diagnosis not present

## 2020-08-30 DIAGNOSIS — R5383 Other fatigue: Secondary | ICD-10-CM | POA: Diagnosis not present

## 2020-08-30 DIAGNOSIS — Z87891 Personal history of nicotine dependence: Secondary | ICD-10-CM | POA: Diagnosis not present

## 2020-08-30 DIAGNOSIS — D509 Iron deficiency anemia, unspecified: Secondary | ICD-10-CM | POA: Diagnosis not present

## 2020-08-30 DIAGNOSIS — N1832 Chronic kidney disease, stage 3b: Secondary | ICD-10-CM | POA: Diagnosis not present

## 2020-08-30 DIAGNOSIS — Z87442 Personal history of urinary calculi: Secondary | ICD-10-CM | POA: Diagnosis not present

## 2020-08-30 DIAGNOSIS — R110 Nausea: Secondary | ICD-10-CM | POA: Diagnosis not present

## 2020-08-30 NOTE — Progress Notes (Addendum)
[place], [place] [address]   CLINIC:  Medical Oncology/Hematology  PCP:  [name], MD [address], [address] [place] [address] [phone]   REASON FOR VISIT:  Follow-up for IgG kappa smoldering myeloma  PRIOR THERAPY: None  CURRENT THERAPY: Observation  INTERVAL HISTORY:  Cheryl Abbott 75 y.o. female returns for routine follow-up of her IgG kappa smoldering myeloma.  She was last seen in office on 02/08/2020 with Dr. Delton Coombes.  Since her last visit, the patient's creatinine has increased slightly.  She has CKD, and review of her renal function shows baseline creatinine to be approximately 1.5-1.8, CKD stage IIIb.  She is followed by [facility].  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  Creatinine on 08/22/2020 was 2.18, compared to 1.44 on 05/13/2020. She reports that she takes ibuprofen once every day. She drinks adequate water, about four 16 oz bottles per day.   She feels very fatigued, just "wants to sleep all the time." She has had progressive fatigue over the past 4 years.  Energy level is worse than it was at her last visit in August 2021.  She reports decreased appetite.   She has been having pain in her right tibia, at about the same location as a previous fracture (~2002). Leg pain on palpation, associated with intermittent ankle swelling.  Onset of leg pain about 2 months ago. She reports that her "shin bone hurts."   She has occasional night sweats, which only affect her head and neck.  No other B symptoms. She reports morning nausea, without vomiting. 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 No new neurologic symptoms such as new-onset hearing loss, blurred vision, headache, or dizziness.   No thromboembolic events since her last visit. Had not noticed any recent bleeding such  as epistaxis, hematuria, melena, hematemesis, or hematochezia.  Denies recent chest pain on exertion, shortness of breath on minimal exertion, pre-syncopal episodes, or palpitations.  Denies any recent fevers, infections, or recent hospitalizations.  No new masses or lymphadenopathy per her report.  She has 10% energy and 25% appetite. She endorses that she is maintaining a stable weight.    REVIEW OF SYSTEMS:  Review of Systems  Constitutional: Positive for fatigue. Negative for appetite change, chills, diaphoresis, fever and unexpected weight change.  HENT:   Negative for lump/mass and nosebleeds.   Eyes: Negative for eye problems.  Respiratory: Negative for cough, hemoptysis and shortness of breath.   Cardiovascular: Negative for chest pain, leg swelling and palpitations.  Gastrointestinal: Positive for constipation and nausea. Negative for abdominal pain, blood in stool, diarrhea and vomiting.  Genitourinary: Negative for hematuria.   Skin: Negative.   Neurological: Negative for dizziness, headaches and light-headedness.  Hematological: Does not bruise/bleed easily.      PAST MEDICAL/SURGICAL HISTORY:  Past Medical History:  Diagnosis Date  . Abnormal brain scan 02/07/2012  . Anemia   . Anxiety   . Anxiety disorder   . Arthritis   . Brain TIA 08/19/2015  . Cancer ([place])    acute myeloma  . 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 Chronic back pain   . Depression   . Fatty liver   . GERD (gastroesophageal reflux disease)   . Headache   . HEMORRHOIDS 03/15/2009   OCT 2014 HB 12. 9    . History of adenomatous polyp of colon  2008  . History of kidney stones   . Hypercalcemia   . Hypercalcemia 11/30/2010  . Hyperlipidemia   . Hypertension   . Lactose intolerance in adult 02/01/2016  . Left ureteral stone   . Lipoma of arm 01/19/2015  . Lumbar disc disease with radiculopathy   . Nephrolithiasis   . Neuropathic pain of both feet 04/11/2016  . Neuropathy, peripheral    . Nocturnal hypoxia 02/04/2014   Study confirms this done 02/10/2014   . OSA treated with BiPAP    per study 2007  . RENAL CALCULUS 03/15/2009   Qualifier: Diagnosis of  By: Zeb Comfort    . Sciatica   . Shoulder pain, bilateral   . Sigmoid diverticulosis   . TIA (transient ischemic attack) 02/26/2016  . Type 2 diabetes mellitus ([place])   . Vaginal dryness, menopausal 10/14/2016   Past Surgical History:  Procedure Laterality Date  . BIOPSY N/A 03/17/2013   Procedure: GASTRIC BIOPSIES;  Surgeon: Danie Binder, MD;  Location: [facility];  Service: Endoscopy;  Laterality: N/A;  . BIOPSY  06/10/2015   Procedure: BIOPSY;  Surgeon: Danie Binder, MD;  [facility];  Service: Endoscopy;;  gastric biopsy  . CARDIAC CATHETERIZATION  05-11-2003  dr Shelva Majestic ([facility])   Abnormal cardiolite/   Normal coronary arteries and normal LVF,  ef  63%  . CARDIOVASCULAR STRESS TEST  01-01-2014   normal lexiscan cardiolite/  no ischemia/ infarct/  normal LV function and wall motion ,  ef 81%  . COLONOSCOPY  last one 10/02/2011   MOD [facility] TICS, IH, NEXT TCS APR 2018  . COLONOSCOPY WITH PROPOFOL N/A 02/26/2017   Procedure: COLONOSCOPY WITH PROPOFOL;  Surgeon: Danie Binder, MD;  [facility];  Service: Endoscopy;  Laterality: N/A;  11:00am  . COLONOSCOPY WITH PROPOFOL N/A 09/22/2019   Procedure: COLONOSCOPY WITH PROPOFOL;  Surgeon: Danie Binder, MD;  [facility];  Service: Endoscopy;  Laterality: N/A;  1:15  . CYSTO/   RIGHT URETEROSOCOPY LASER LITHOTRIPSY STONE EXTRACTION  10-13-2004  . CYSTO/  RIGHT RETROGRADE PYELOGRAM/  PLACMENT RIGHT URETERAL STENT  01-10-2010  . CYSTOSCOPY W/ URETERAL STENT PLACEMENT Right 08/19/2015   Procedure: CYSTOSCOPY WITH RETROGRADE PYELOGRAM/URETERAL STENT PLACEMENT;  Surgeon: Franchot Gallo, MD;  Location: [facility];  Service: Urology;  Laterality: Right;  . CYSTOSCOPY W/ URETERAL STENT PLACEMENT Left 02/23/2020   Procedure:  CYSTOSCOPY LEFT  RETROGRADE PYELOGRAM LEFT URETEROSCOPY URETERAL STENT PLACEMENT;  Surgeon: Franchot Gallo, MD;  Location: [facility];  Service: Urology;  Laterality: Left;  . CYSTOSCOPY WITH RETROGRADE PYELOGRAM, URETEROSCOPY AND STENT PLACEMENT Left 10/21/2014   Procedure: CYSTOSCOPY WITH RETROGRADE PYELOGRAM, URETEROSCOPY AND STENT PLACEMENT;  Surgeon: Franchot Gallo, MD;  Location: [facility];  Service: Urology;  Laterality: Left;  . CYSTOSCOPY WITH RETROGRADE PYELOGRAM, URETEROSCOPY AND STENT PLACEMENT Right 11/22/2015   Procedure: CYSTOSCOPY, RIGHT URETERAL STENT REMOVAL; RIGHT RETROGRADE PYELOGRAM, RIGHT URETEROSCOPY WITH BALLOON DILATION; RIGHT URETERAL STENT PLACEMENT;  Surgeon: Franchot Gallo, MD;  Location: [facility];  Service: Urology;  Laterality: Right;  . CYSTOSCOPY/RETROGRADE/URETEROSCOPY/STONE EXTRACTION WITH BASKET Bilateral 08/02/2015   Procedure: CYSTOSCOPY; BILATERAL RETROGRADE PYELOGRAMS; BILATERAL URETEROSCOPY, STONE EXTRACTION WITH BASKET;  Surgeon: Franchot Gallo, MD;  [facility]: [facility];  Service: Urology;  Laterality: Bilateral;  . CYSTOSCOPY/RETROGRADE/URETEROSCOPY/STONE EXTRACTION WITH BASKET Right 06/28/2015   Procedure: CYSTOSCOPY/RETROGRADE/URETEROSCOPY/STONE EXTRACTION WITH BASKET, RIGHT URETERAL DOUBLE J STENT PLACEMENT;  Surgeon: Franchot Gallo, MD;  [facility];  Service: Urology;  Laterality:  Right;  . ESOPHAGOGASTRODUODENOSCOPY (EGD) WITH PROPOFOL N/A 03/17/2013   Procedure: ESOPHAGOGASTRODUODENOSCOPY (EGD) WITH PROPOFOL;  Surgeon: Danie Binder, MD;  [facility];  Service: Endoscopy;  Laterality: N/A;  . ESOPHAGOGASTRODUODENOSCOPY (EGD) WITH PROPOFOL N/A 06/10/2015   Distal gastritis, distal esophageal stricture s/p dilation  . ESOPHAGOGASTRODUODENOSCOPY (EGD) WITH PROPOFOL N/A 02/26/2017   Procedure: ESOPHAGOGASTRODUODENOSCOPY (EGD) WITH PROPOFOL;  Surgeon: Danie Binder, MD;  [facility];  Service: Endoscopy;   Laterality: N/A;  . FLEXIBLE SIGMOIDOSCOPY  09/11/2011   ZOX:[id number] Internal hemorrhoids  . HOLMIUM LASER APPLICATION Left 9/81/1914   Procedure: HOLMIUM LASER APPLICATION;  Surgeon: Franchot Gallo, MD;  [facility];  Service: Urology;  Laterality: Left;  . HOLMIUM LASER APPLICATION Right 7/82/9562   Procedure: HOLMIUM LASER APPLICATION;  Surgeon: Franchot Gallo, MD;  Location: [facility];  Service: Urology;  Laterality: Right;  . HOLMIUM LASER APPLICATION  07/11/8655   Procedure: HOLMIUM LASER APPLICATION;  Surgeon: Franchot Gallo, MD;  Location: [facility];  Service: Urology;;  . MASS EXCISION Left 03/04/2015   Procedure: EXCISION OF SOFT TISSUE NEOPLASM LEFT ARM;  Surgeon: Aviva Signs, MD;  Location: [facility];  Service: General;  Laterality: Left;  . PERCUTANEOUS NEPHROSTOLITHOTOMY Bilateral 12/  2011     [facility]  . POLYPECTOMY N/A 03/17/2013   Procedure: GASTRIC POLYPECTOMY;  Surgeon: Danie Binder, MD;  Location: [facility];  Service: Endoscopy;  Laterality: N/A;  . POLYPECTOMY  02/26/2017   Procedure: POLYPECTOMY;  Surgeon: Danie Binder, MD;  [facility];  Service: Endoscopy;;  polyp at cecum, ascending colon polyps x3, hepatic flexure polyps x8, transverse colon polyps x8   . POLYPECTOMY  09/22/2019   Procedure: POLYPECTOMY;  Surgeon: Danie Binder, MD;  [facility];  Service: Endoscopy;;  . REMOVAL RIGHT THIGH CYST  2006  . SAVORY DILATION N/A 03/17/2013   Procedure: SAVORY DILATION;  Surgeon: Danie Binder, MD;  [facility];  Service: Endoscopy;  Laterality: N/A;  #12.8, 14, 15, 16 dilators used  . SAVORY DILATION N/A 06/10/2015   Procedure: SAVORY DILATION;  Surgeon: Danie Binder, MD;  [facility];  Service: Endoscopy;  Laterality: N/A;  . TRANSTHORACIC ECHOCARDIOGRAM  01-01-2014   mild LVH/  ef 84-69%/  grade I diastolic dysfunction/  trivial MR, TR, and PR  . VAGINAL HYSTERECTOMY  1970's     SOCIAL HISTORY:   Social History   Socioeconomic History  . Marital status: Divorced    Spouse name: Not on file  . Number of children: 2  . Years of education: Not on file  . Highest education level: Not on file  Occupational History  . Occupation: retired   Tobacco Use  . Smoking status: Former Smoker    Packs/day: 1.00    Years: 20.00    Pack years: 20.00    Types: Cigarettes    Start date: 09/21/1974    Quit date: 09/20/1988    Years since quitting: 31.9  . Smokeless tobacco: Never Used  . Tobacco comment: Quit x 20 years  Vaping Use  . Vaping Use: Never used  Substance and Sexual Activity  . Alcohol use: No    Alcohol/week: 0.0 standard drinks  . Drug use: No  . Sexual activity: Never    Birth control/protection: Surgical  Other Topics Concern  . Not on file  Social History Narrative   Patient is right handed   Patient drinks some caffeine daily.   Social  Determinants of Health   Financial Resource Strain: Low Risk   . Difficulty of Paying Living Expenses: Not very hard  Food Insecurity: No Food Insecurity  . Worried About Charity fundraiser in the Last Year: Never true  . Ran Out of Food in the Last Year: Never true  Transportation Needs: No Transportation Needs  . Lack of Transportation (Medical): No  . Lack of Transportation (Non-Medical): No  Physical Activity: Inactive  . Days of Exercise per Week: 0 days  . Minutes of Exercise per Session: 0 min  Stress: Stress Concern Present  . Feeling of Stress : To some extent  Social Connections: Moderately Isolated  . Frequency of Communication with Friends and Family: More than three times a week  . Frequency of Social Gatherings with Friends and Family: More than three times a week  . Attends Religious Services: More than 4 times per year  . Active Member of Clubs or Organizations: No  . Attends Archivist Meetings: Never  . Marital Status: Divorced  Human resources officer Violence: Not At Risk  . Fear of Current or  Ex-Partner: No  . Emotionally Abused: No  . Physically Abused: No  . Sexually Abused: No    FAMILY HISTORY:  Family History  Problem Relation Age of Onset  . Hypertension Mother   . Diabetes Mother   . Heart failure Mother   . Dementia Mother   . Emphysema Father   . Hypertension Father   . Diabetes Brother   . GER disease Brother   . Hypertension Sister   . Hypertension Sister   . Cancer Other        family history   . Diabetes Other        family history   . Heart defect Other        famiily history   . Arthritis Other        family history   . Anesthesia problems Neg Hx   . Hypotension Neg Hx   . Malignant hyperthermia Neg Hx   . Pseudochol deficiency Neg Hx   . Colon cancer Neg Hx     CURRENT MEDICATIONS:  Outpatient Encounter Medications as of 08/30/2020  Medication Sig  . acetaminophen (TYLENOL) 500 MG tablet Take 1,000 mg by mouth every 6 (six) hours as needed for moderate pain.   Cheryl Kitchen amLODipine (NORVASC) 10 MG tablet TAKE 1 TABLET BY MOUTH EVERY DAY  . aspirin EC 81 MG tablet Take 81 mg by mouth daily.   . betamethasone dipropionate 0.05 % cream Apply topically 2 (two) times daily. Apply twice daily to rash on arms and legs for 10 days  . budesonide-formoterol (SYMBICORT) 80-4.5 MCG/ACT inhaler INHALE 2 PUFFS INTO THE LUNGS TWICE A DAY  . Cholecalciferol (VITAMIN D3) 50 MCG (2000 UT) TABS Take 2,000 Units by mouth daily.  . diclofenac Sodium (VOLTAREN) 1 % GEL APPLY 4 G TOPICALLY 4 (FOUR) TIMES DAILY.  . diphenhydramine-acetaminophen (TYLENOL PM) 25-500 MG TABS tablet Take 2 tablets by mouth at bedtime.  Mariane Baumgarten Sodium (DSS) 100 MG CAPS Take by mouth.   . gabapentin (NEURONTIN) 100 MG capsule TAKE ONE TO TWO CAPSULES ART BEDTIME FOR NERVE PAIN  . GAVILYTE-G 236 g solution USE AS DIRECTED.O  . glucose blood (ONETOUCH ULTRA) test strip Use as instructed once daily dx e11.9  . iron polysaccharides (NIFEREX) 150 MG capsule Take by mouth.   . magnesium citrate  SOLN Take 0.5 Bottles by mouth every 3 (three) days.  Cheryl Kitchen  methocarbamol (ROBAXIN) 750 MG tablet Take 1 tablet (750 mg total) by mouth 4 (four) times daily.  Glory Rosebush Delica Lancets 59D MISC Once daily testing dx e11.9  . pantoprazole (PROTONIX) 40 MG tablet TAKE 1 TABLET (40 MG TOTAL) BY MOUTH 2 (TWO) TIMES DAILY.  Cheryl Kitchen PARoxetine (PAXIL) 40 MG tablet TAKE 1 TABLET BY MOUTH EVERY DAY IN THE MORNING  . rosuvastatin (CRESTOR) 20 MG tablet Take 1 tablet (20 mg total) by mouth daily.  . TRADJENTA 5 MG TABS tablet TAKE 1 TABLET BY MOUTH EVERY DAY  . traMADol (ULTRAM) 50 MG tablet TAKE 1 TABLET BY MOUTH EVERY 6 HOURS AS NEEDED FOR PAIN   No facility-administered encounter medications on file as of 08/30/2020.    ALLERGIES:  Allergies  Allergen Reactions  . Ace Inhibitors Cough  . Keflex [Cephalexin] Diarrhea and Nausea And Vomiting  . Nitrofurantoin Diarrhea and Nausea And Vomiting  . Penicillins Hives and Other (See Comments)    Reaction:  Blisters on hands/feet  Has patient had a PCN reaction causing immediate rash, facial/tongue/throat swelling, SOB or lightheadedness with hypotension: No Has patient had a PCN reaction causing severe rash involving mucus membranes or skin necrosis: No Has patient had a PCN reaction that required hospitalization No Has patient had a PCN reaction occurring within the last 10 years: No If all of the above answers are "NO", then may proceed with Cephalosporin use.     PHYSICAL EXAM:  ECOG PERFORMANCE STATUS: 2 - Symptomatic, <50% confined to bed  There were no vitals filed for this visit. There were no vitals filed for this visit. Physical Exam Constitutional:      Appearance: Normal appearance. She is obese.  HENT:     Head: Normocephalic and atraumatic.     Mouth/Throat:     Mouth: Mucous membranes are moist.  Eyes:     Extraocular Movements: Extraocular movements intact.     Pupils: Pupils are equal, round, and reactive to light.  Cardiovascular:      Rate and Rhythm: Normal rate and regular rhythm.     Pulses: Normal pulses.     Heart sounds: Normal heart sounds.  Pulmonary:     Effort: Pulmonary effort is normal.     Breath sounds: Normal breath sounds.  Abdominal:     General: Bowel sounds are normal.     Palpations: Abdomen is soft.     Tenderness: There is no abdominal tenderness.  Musculoskeletal:        General: Tenderness (Right pretibial region tender to palpation) present. No swelling.     Right lower leg: No edema.     Left lower leg: No edema.  Lymphadenopathy:     Cervical: No cervical adenopathy.  Skin:    General: Skin is warm and dry.  Neurological:     General: No focal deficit present.     Mental Status: She is alert and oriented to person, place, and time.  Psychiatric:        Mood and Affect: Mood normal.        Behavior: Behavior normal.      LABORATORY DATA:  I have reviewed the labs as listed.  CBC    Component Value Date/Time   WBC 7.4 08/22/2020 1238   RBC 4.28 08/22/2020 1238   HGB 13.5 08/22/2020 1238   HCT 42.0 08/22/2020 1238   PLT 279 08/22/2020 1238   MCV 98.1 08/22/2020 1238   MCH 31.5 08/22/2020 1238   MCHC 32.1 08/22/2020 1238  RDW 15.4 08/22/2020 1238   LYMPHSABS 1.4 08/22/2020 1238   MONOABS 0.5 08/22/2020 1238   EOSABS 0.4 08/22/2020 1238   BASOSABS 0.1 08/22/2020 1238   CMP Latest Ref Rng & Units 08/22/2020 05/13/2020 02/01/2020  Glucose 70 - 99 mg/dL 141(H) 99 158(H)  BUN 8 - 23 mg/dL 20 17 13   Creatinine 0.44 - 1.00 mg/dL 2.18(H) 1.44(H) 1.71(H)  Sodium 135 - 145 mmol/L 138 138 134(L)  Potassium 3.5 - 5.1 mmol/L 4.5 4.5 3.8  Chloride 98 - 111 mmol/L 103 101 102  CO2 22 - 32 mmol/L 25 22 23   Calcium 8.9 - 10.3 mg/dL 10.0 10.7(H) 9.8  Total Protein 6.5 - 8.1 g/dL 9.0(H) 8.6(H) 9.0(H)  Total Bilirubin 0.3 - 1.2 mg/dL 0.5 <0.2 0.3  Alkaline Phos 38 - 126 U/L 73 102 84  AST 15 - 41 U/L 20 26 21   ALT 0 - 44 U/L 20 30 15     DIAGNOSTIC IMAGING:  I have independently  reviewed the relevant imaging and discussed with the patient.  ASSESSMENT:  1. IgG kappa smoldering myeloma: -Diagnosed in September 2018 after nephrology work-up found M spike in urine (8.3 mg per 24 hours) and serum (1.5 g/dL) -Bone marrow biopsy on 02/19/2017 showed trilineage hematopoiesis, plasma cells 14%, cytogenetics normal, FISH panel normal -Skeletal survey on 02/01/2020 did not show any lytic lesions.  Slight interval increase in size of the left kidney stone. -IFE from 08/22/2020 continues to show IgG monoclonal protein with kappa light chain specificity -Per [facility] 20/20/2 guidelines, patient is not high risk SMM, no treatment indicated at this time, only continued close observation -Reviewed labs from 08/22/2020, M spike is 1.2 g.  Kappa light chain 51.4 and lambda light chains 16.9 with ratio of 3.04 -Underlying CKD with baseline creatinine 1.4-1.9; most recent creatinine on 08/22/2020 shows increased creatinine 2.18  2.  Normocytic anemia with iron deficiency: -She had a colonoscopy on 09/22/2019 which showed polyps in the transverse colon, ascending colon and in the sigmoid colon. Diverticulosis in the rectosigmoid colon, sigmoid colon and descending colon. External and internal hemorrhoids seen. -Labs reviewed from 08/22/2020 shows serum iron 109, saturation 23%, ferritin 19 -Hemoglobin 13.5 -Has required intermittent IV iron infusions, last infusion on 02/12/2020 and 02/19/2020; unable to tolerate oral iron due to constipation    PLAN:  1. IgG kappa smoldering myeloma: -Reviewed labs from 08/22/2020, M spike is 1.2 g.  Kappa light chain 51.4 and lambda light chains 16.9 with ratio of 3.04; creatinine 2.18, calcium 10.0, hemoglobin 13.5 -Underlying CKD with baseline creatinine 1.4-1.9; most recent creatinine on 08/22/2020 shows increased creatinine 2.18 - she has had one previous episode of creatinine 2.22 on 01/02/2019, secondary to AKI, with subsequent return to  baseline -Creatinine > 2.0 meets criteria for CRAB - unclear at this time if this represents AKI or true renal effects of IgG kappa SMM; elevated creatinine may be due to NSAID use, have asked patient to see her nephrologist within the next 4 weeks -Due to elevated creatinine, will repeat renal function and myeloma labs in 8 weeks, return to clinic for follow-up in 2 months -Due to complaints of right leg pain, will check right leg x-ray to assess for lytic lesions -we will call with abnormal results, otherwise review normal results at next visit  2.  Normocytic anemia with iron deficiency: -Combination anemia from iron deficiency and CKD. -Hemoglobin 13.5, within normal limits -Ferritin 19 (goal > 100 due to underlying CKD), iron saturation 23% (target iron saturation >  30%) -Recommend Feraheme weekly x 2.  We discussed side effects. -Repeat anemia labs in 6 months    PLAN SUMMARY & DISPOSITION: -Repeat labs in 2 months, follow-up 1 week after labs -Schedule for IV Feraheme x 2 -Check Xray of right leg  -Needs to see Dr. Theador Hawthorne (nephrology) ASAP  -Stop taking ibuprofen, naproxen, and other NSAIDs (can continue to take Tylenol)   All questions were answered. The patient knows to call the clinic with any problems, questions or concerns.  Medical decision making: Moderate (chronic illness with progression, review of external nephrology note, review of test, ordering new tests)  Time spent on visit: I spent 30 minutes counseling the patient face to face. The total time spent in the appointment was 40 minutes and more than 50% was on counseling.   The above plan of care was discussed with Dr. Delton Coombes, supervising physician, who agrees.  Harriett Rush, PA-C  08/30/20 12:26 PM   **ADDENDUM - Patient's insurance plan does not support Feraheme. Have ordered Venofer instead.

## 2020-08-30 NOTE — Patient Instructions (Signed)
[place] [facility] Discharge Instructions  You were seen today by Tarri Abernethy PA-C for your smoldering myeloma. Your myeloma labs look stable.  However, your kidney function is slightly worse.  This may be due to your chronic kidney disease, or your use of NSAIDs; or it may be due to progression of your smoldering myeloma.  We would like you to see Dr. Theador Hawthorne, your nephrologist, within the next month to check your kidney function.     LABS: Return in 8 weeks for labs   OTHER TESTS: Xray right leg due to bone pain   MEDICATIONS: STOP taking ibuprofen, Aleve, Advil, naproxen, and all other NSAID pain medications. You can continue to take Tylenol.  FOLLOW-UP APPOINTMENT: I would like to see you here in the oncology clinic in 2 months.   Thank you for choosing [place] at [facility] to provide your oncology and hematology care.  To afford each patient quality time with our provider, please arrive at least 15 minutes before your scheduled appointment time.   If you have a lab appointment with the [facility] please come in thru the Main Entrance and check in at the main information desk.  You need to re-schedule your appointment should you arrive 10 or more minutes late.  We strive to give you quality time with our providers, and arriving late affects you and other patients whose appointments are after yours.  Also, if you no show three or more times for appointments you may be dismissed from the clinic at the providers discretion.     Again, thank you for choosing [facility].  Our hope is that these requests will decrease the amount of time that you wait before being seen by our physicians.       _____________________________________________________________  Should you have questions after your visit to [facility], please contact our office at [phone] and follow the prompts.  Our office hours are  8:00 a.m. and 4:30 p.m. Monday - Friday.  Please note that voicemails left after 4:00 p.m. may not be returned until the following business day.  We are closed weekends and major holidays.  You do have access to a nurse 24-7, just call the main number to the clinic [phone] and do not press any options, hold on the line and a nurse will answer the phone.    For prescription refill requests, have your pharmacy contact our office and allow 72 hours.    Due to Covid, you will need to wear a mask upon entering the hospital. If you do not have a mask, a mask will be given to you at the Main Entrance upon arrival. For doctor visits, patients may have 1 support person age 36 or older with them. For treatment visits, patients can not have anyone with them due to social distancing guidelines and our immunocompromised population.

## 2020-08-31 ENCOUNTER — Other Ambulatory Visit (HOSPITAL_COMMUNITY): Payer: Self-pay | Admitting: Physician Assistant

## 2020-09-01 DIAGNOSIS — M5021 Other cervical disc displacement,  high cervical region: Secondary | ICD-10-CM | POA: Diagnosis not present

## 2020-09-01 DIAGNOSIS — M50223 Other cervical disc displacement at C6-C7 level: Secondary | ICD-10-CM | POA: Diagnosis not present

## 2020-09-01 DIAGNOSIS — M79601 Pain in right arm: Secondary | ICD-10-CM | POA: Diagnosis not present

## 2020-09-01 DIAGNOSIS — M5412 Radiculopathy, cervical region: Secondary | ICD-10-CM | POA: Diagnosis not present

## 2020-09-01 DIAGNOSIS — M50222 Other cervical disc displacement at C5-C6 level: Secondary | ICD-10-CM | POA: Diagnosis not present

## 2020-09-01 DIAGNOSIS — M25511 Pain in right shoulder: Secondary | ICD-10-CM | POA: Diagnosis not present

## 2020-09-01 DIAGNOSIS — G5601 Carpal tunnel syndrome, right upper limb: Secondary | ICD-10-CM | POA: Diagnosis not present

## 2020-09-09 ENCOUNTER — Other Ambulatory Visit: Payer: Self-pay

## 2020-09-09 ENCOUNTER — Inpatient Hospital Stay (HOSPITAL_COMMUNITY): Payer: Medicare Other | Attending: Hematology and Oncology

## 2020-09-09 VITALS — BP 141/76 | HR 89 | Temp 97.0°F | Resp 18

## 2020-09-09 DIAGNOSIS — D509 Iron deficiency anemia, unspecified: Secondary | ICD-10-CM | POA: Diagnosis not present

## 2020-09-09 DIAGNOSIS — C9000 Multiple myeloma not having achieved remission: Secondary | ICD-10-CM | POA: Diagnosis present

## 2020-09-09 DIAGNOSIS — Z79899 Other long term (current) drug therapy: Secondary | ICD-10-CM | POA: Insufficient documentation

## 2020-09-09 DIAGNOSIS — D508 Other iron deficiency anemias: Secondary | ICD-10-CM

## 2020-09-09 MED ORDER — SODIUM CHLORIDE 0.9 % IV SOLN
300.0000 mg | Freq: Once | INTRAVENOUS | Status: AC
  Administered 2020-09-09: 300 mg via INTRAVENOUS
  Filled 2020-09-09: qty 15

## 2020-09-09 MED ORDER — SODIUM CHLORIDE 0.9 % IV SOLN: Freq: Once | INTRAVENOUS | Status: AC

## 2020-09-09 NOTE — Progress Notes (Signed)
Patient presents today for Venofer infusion.  Vital signs WNL.  Patient only complains of fatigue.  Venofer infusion given today per MD orders.  Peripheral IV started and blood return noted before and after infusion.  Stable during infusion and post infusion wait time without adverse affects.  Vital signs stable.  No complaints at this time.  Discharge from clinic ambulatory in stable condition.  Alert and oriented X 3.  Follow up with [facility] as scheduled.

## 2020-09-09 NOTE — Patient Instructions (Signed)
Iron Sucrose infusion What is this medicine? IRON SUCROSE (AHY ern SOO krohs) is an iron complex. Iron is used to make healthy red blood cells, which carry oxygen and nutrients throughout the body. This medicine is used to treat iron deficiency anemia in people with chronic kidney disease. This medicine may be used for other purposes; ask your health care provider or pharmacist if you have questions. COMMON BRAND NAME(S): Venofer What should I tell my health care provider before I take this medicine? They need to know if you have any of these conditions:  anemia not caused by low iron levels  heart disease  high levels of iron in the blood  kidney disease  liver disease  an unusual or allergic reaction to iron, other medicines, foods, dyes, or preservatives  pregnant or trying to get pregnant  breast-feeding How should I use this medicine? This medicine is for infusion into a vein. It is given by a health care professional in a hospital or clinic setting. Talk to your pediatrician regarding the use of this medicine in children. While this drug may be prescribed for children as young as 2 years for selected conditions, precautions do apply. Overdosage: If you think you have taken too much of this medicine contact a poison control center or emergency room at once. NOTE: This medicine is only for you. Do not share this medicine with others. What if I miss a dose? It is important not to miss your dose. Call your doctor or health care professional if you are unable to keep an appointment. What may interact with this medicine? Do not take this medicine with any of the following medications:  deferoxamine  dimercaprol  other iron products This medicine may also interact with the following medications:  chloramphenicol  deferasirox This list may not describe all possible interactions. Give your health care provider a list of all the medicines, herbs, non-prescription drugs, or  dietary supplements you use. Also tell them if you smoke, drink alcohol, or use illegal drugs. Some items may interact with your medicine. What should I watch for while using this medicine? Visit your doctor or healthcare professional regularly. Tell your doctor or healthcare professional if your symptoms do not start to get better or if they get worse. You may need blood work done while you are taking this medicine. You may need to follow a special diet. Talk to your doctor. Foods that contain iron include: whole grains/cereals, dried fruits, beans, or peas, leafy green vegetables, and organ meats (liver, kidney). What side effects may I notice from receiving this medicine? Side effects that you should report to your doctor or health care professional as soon as possible:  allergic reactions like skin rash, itching or hives, swelling of the face, lips, or tongue  breathing problems  changes in blood pressure  cough  fast, irregular heartbeat  feeling faint or lightheaded, falls  fever or chills  flushing, sweating, or hot feelings  joint or muscle aches/pains  seizures  swelling of the ankles or feet  unusually weak or tired Side effects that usually do not require medical attention (report to your doctor or health care professional if they continue or are bothersome):  diarrhea  feeling achy  headache  irritation at site where injected  nausea, vomiting  stomach upset  tiredness This list may not describe all possible side effects. Call your doctor for medical advice about side effects. You may report side effects to FDA at 1-800-FDA-1088. Where should I keep   my medicine? This drug is given in a hospital or clinic and will not be stored at home. NOTE: This sheet is a summary. It may not cover all possible information. If you have questions about this medicine, talk to your doctor, pharmacist, or health care provider.  2021 Elsevier/Gold Standard (2011-03-08  17:14:35)  

## 2020-09-12 ENCOUNTER — Encounter (HOSPITAL_COMMUNITY): Payer: Self-pay

## 2020-09-12 ENCOUNTER — Inpatient Hospital Stay (HOSPITAL_COMMUNITY): Payer: Medicare Other

## 2020-09-12 ENCOUNTER — Other Ambulatory Visit: Payer: Self-pay

## 2020-09-12 VITALS — BP 122/66 | HR 88 | Temp 97.0°F | Resp 17

## 2020-09-12 DIAGNOSIS — D509 Iron deficiency anemia, unspecified: Secondary | ICD-10-CM | POA: Diagnosis not present

## 2020-09-12 DIAGNOSIS — Z79899 Other long term (current) drug therapy: Secondary | ICD-10-CM | POA: Diagnosis not present

## 2020-09-12 DIAGNOSIS — D508 Other iron deficiency anemias: Secondary | ICD-10-CM

## 2020-09-12 MED ORDER — SODIUM CHLORIDE 0.9 % IV SOLN
300.0000 mg | Freq: Once | INTRAVENOUS | Status: AC
  Administered 2020-09-12: 300 mg via INTRAVENOUS
  Filled 2020-09-12: qty 15

## 2020-09-12 MED ORDER — SODIUM CHLORIDE 0.9 % IV SOLN
Freq: Once | INTRAVENOUS | Status: AC
Start: 2020-09-12 — End: 2020-09-12

## 2020-09-12 NOTE — Patient Instructions (Signed)
Venofer 300 mg today.  Follow up as scheduled.  Please call the clinic if you have any questions or concerns.

## 2020-09-12 NOTE — Progress Notes (Signed)
Pt here for 300 mg venofer.  Tolerated iron infusion well today without incidence and stable during infusion. Vital signs stable prior to discharge.  Discharged via wheelchair in stable condition. AVS reviewed.

## 2020-09-14 ENCOUNTER — Other Ambulatory Visit: Payer: Self-pay | Admitting: Nephrology

## 2020-09-14 ENCOUNTER — Other Ambulatory Visit (HOSPITAL_COMMUNITY): Payer: Self-pay | Admitting: Nephrology

## 2020-09-14 DIAGNOSIS — I129 Hypertensive chronic kidney disease with stage 1 through stage 4 chronic kidney disease, or unspecified chronic kidney disease: Secondary | ICD-10-CM | POA: Diagnosis not present

## 2020-09-14 DIAGNOSIS — N200 Calculus of kidney: Secondary | ICD-10-CM | POA: Diagnosis not present

## 2020-09-14 DIAGNOSIS — N170 Acute kidney failure with tubular necrosis: Secondary | ICD-10-CM | POA: Diagnosis not present

## 2020-09-14 DIAGNOSIS — C9001 Multiple myeloma in remission: Secondary | ICD-10-CM | POA: Diagnosis not present

## 2020-09-14 DIAGNOSIS — N1832 Chronic kidney disease, stage 3b: Secondary | ICD-10-CM | POA: Diagnosis not present

## 2020-09-15 ENCOUNTER — Other Ambulatory Visit: Payer: Self-pay

## 2020-09-15 ENCOUNTER — Inpatient Hospital Stay (HOSPITAL_COMMUNITY): Payer: Medicare Other

## 2020-09-15 VITALS — BP 125/75 | HR 92 | Temp 97.0°F | Resp 18

## 2020-09-15 DIAGNOSIS — D509 Iron deficiency anemia, unspecified: Secondary | ICD-10-CM | POA: Diagnosis not present

## 2020-09-15 DIAGNOSIS — D508 Other iron deficiency anemias: Secondary | ICD-10-CM

## 2020-09-15 DIAGNOSIS — Z79899 Other long term (current) drug therapy: Secondary | ICD-10-CM | POA: Diagnosis not present

## 2020-09-15 MED ORDER — SODIUM CHLORIDE 0.9 % IV SOLN
300.0000 mg | Freq: Once | INTRAVENOUS | Status: AC
  Administered 2020-09-15: 300 mg via INTRAVENOUS
  Filled 2020-09-15: qty 15

## 2020-09-15 MED ORDER — SODIUM CHLORIDE 0.9 % IV SOLN: Freq: Once | INTRAVENOUS | Status: AC

## 2020-09-15 NOTE — Patient Instructions (Signed)
Iron Sucrose infusion What is this medicine? IRON SUCROSE (AHY ern SOO krohs) is an iron complex. Iron is used to make healthy red blood cells, which carry oxygen and nutrients throughout the body. This medicine is used to treat iron deficiency anemia in people with chronic kidney disease. This medicine may be used for other purposes; ask your health care provider or pharmacist if you have questions. COMMON BRAND NAME(S): Venofer What should I tell my health care provider before I take this medicine? They need to know if you have any of these conditions:  anemia not caused by low iron levels  heart disease  high levels of iron in the blood  kidney disease  liver disease  an unusual or allergic reaction to iron, other medicines, foods, dyes, or preservatives  pregnant or trying to get pregnant  breast-feeding How should I use this medicine? This medicine is for infusion into a vein. It is given by a health care professional in a hospital or clinic setting. Talk to your pediatrician regarding the use of this medicine in children. While this drug may be prescribed for children as young as 2 years for selected conditions, precautions do apply. Overdosage: If you think you have taken too much of this medicine contact a poison control center or emergency room at once. NOTE: This medicine is only for you. Do not share this medicine with others. What if I miss a dose? It is important not to miss your dose. Call your doctor or health care professional if you are unable to keep an appointment. What may interact with this medicine? Do not take this medicine with any of the following medications:  deferoxamine  dimercaprol  other iron products This medicine may also interact with the following medications:  chloramphenicol  deferasirox This list may not describe all possible interactions. Give your health care provider a list of all the medicines, herbs, non-prescription drugs, or  dietary supplements you use. Also tell them if you smoke, drink alcohol, or use illegal drugs. Some items may interact with your medicine. What should I watch for while using this medicine? Visit your doctor or healthcare professional regularly. Tell your doctor or healthcare professional if your symptoms do not start to get better or if they get worse. You may need blood work done while you are taking this medicine. You may need to follow a special diet. Talk to your doctor. Foods that contain iron include: whole grains/cereals, dried fruits, beans, or peas, leafy green vegetables, and organ meats (liver, kidney). What side effects may I notice from receiving this medicine? Side effects that you should report to your doctor or health care professional as soon as possible:  allergic reactions like skin rash, itching or hives, swelling of the face, lips, or tongue  breathing problems  changes in blood pressure  cough  fast, irregular heartbeat  feeling faint or lightheaded, falls  fever or chills  flushing, sweating, or hot feelings  joint or muscle aches/pains  seizures  swelling of the ankles or feet  unusually weak or tired Side effects that usually do not require medical attention (report to your doctor or health care professional if they continue or are bothersome):  diarrhea  feeling achy  headache  irritation at site where injected  nausea, vomiting  stomach upset  tiredness This list may not describe all possible side effects. Call your doctor for medical advice about side effects. You may report side effects to FDA at 1-800-FDA-1088. Where should I keep   my medicine? This drug is given in a hospital or clinic and will not be stored at home. NOTE: This sheet is a summary. It may not cover all possible information. If you have questions about this medicine, talk to your doctor, pharmacist, or health care provider.  2021 Elsevier/Gold Standard (2011-03-08  17:14:35)  

## 2020-09-15 NOTE — Progress Notes (Signed)
Patient presents today for Venofer infusion.  Vital signs WNL.  Patient has no new complaints at this time.  Peripheral IV started and blood return noted before and after infusion.  Venofer infusion given today per MD orders.  Stable durning infusion without adverse affects.  Vital signs stable.  Patient unable to stay the full thirty minute post infusion wait time due to transportation issues.  No complaints at this time.  Discharge from clinic via wheelchair in stable condition.  Alert and oriented X 3.  Follow up with [facility] as scheduled.

## 2020-09-17 DIAGNOSIS — R0902 Hypoxemia: Secondary | ICD-10-CM | POA: Diagnosis not present

## 2020-09-19 ENCOUNTER — Inpatient Hospital Stay (HOSPITAL_COMMUNITY): Payer: Medicare Other

## 2020-09-20 ENCOUNTER — Ambulatory Visit (HOSPITAL_COMMUNITY)
Admission: RE | Admit: 2020-09-20 | Discharge: 2020-09-20 | Disposition: A | Discharge status: Home / Self Care | Payer: Medicare Other | Source: Ambulatory Visit | Attending: Nephrology | Admitting: Nephrology

## 2020-09-20 ENCOUNTER — Other Ambulatory Visit: Payer: Self-pay

## 2020-09-20 ENCOUNTER — Other Ambulatory Visit (HOSPITAL_COMMUNITY)
Admission: RE | Admit: 2020-09-20 | Discharge: 2020-09-20 | Disposition: A | Discharge status: Home / Self Care | Payer: Medicare Other | Source: Ambulatory Visit | Attending: Nephrology | Admitting: Nephrology

## 2020-09-20 DIAGNOSIS — C9001 Multiple myeloma in remission: Secondary | ICD-10-CM | POA: Diagnosis not present

## 2020-09-20 DIAGNOSIS — N170 Acute kidney failure with tubular necrosis: Secondary | ICD-10-CM | POA: Diagnosis not present

## 2020-09-20 DIAGNOSIS — N1832 Chronic kidney disease, stage 3b: Secondary | ICD-10-CM | POA: Diagnosis not present

## 2020-09-20 DIAGNOSIS — I129 Hypertensive chronic kidney disease with stage 1 through stage 4 chronic kidney disease, or unspecified chronic kidney disease: Secondary | ICD-10-CM | POA: Diagnosis not present

## 2020-09-20 DIAGNOSIS — N1330 Unspecified hydronephrosis: Secondary | ICD-10-CM | POA: Diagnosis not present

## 2020-09-20 DIAGNOSIS — N200 Calculus of kidney: Secondary | ICD-10-CM | POA: Insufficient documentation

## 2020-09-20 LAB — RENAL FUNCTION PANEL
Albumin: 4.1 g/dL (ref 3.5–5.0)
Anion gap: 9 (ref 5–15)
BUN: 21 mg/dL (ref 8–23)
CO2: 25 mmol/L (ref 22–32)
Calcium: 9.7 mg/dL (ref 8.9–10.3)
Chloride: 104 mmol/L (ref 98–111)
Creatinine, Ser: 2.38 mg/dL — ABNORMAL HIGH (ref 0.44–1.00)
GFR, Estimated: 21 mL/min — ABNORMAL LOW (ref >60)
Glucose, Bld: 141 mg/dL — ABNORMAL HIGH (ref 70–99)
Phosphorus: 2.7 mg/dL (ref 2.5–4.6)
Potassium: 4 mmol/L (ref 3.5–5.1)
Sodium: 138 mmol/L (ref 135–145)

## 2020-09-21 ENCOUNTER — Ambulatory Visit (INDEPENDENT_AMBULATORY_CARE_PROVIDER_SITE_OTHER): Payer: Medicare Other | Admitting: Family Medicine

## 2020-09-21 ENCOUNTER — Ambulatory Visit: Payer: Medicare Other | Admitting: Family Medicine

## 2020-09-21 ENCOUNTER — Encounter: Payer: Self-pay | Admitting: Family Medicine

## 2020-09-21 VITALS — BP 122/77 | HR 88 | Resp 15 | Ht 67.0 in | Wt 196.0 lb

## 2020-09-21 DIAGNOSIS — I129 Hypertensive chronic kidney disease with stage 1 through stage 4 chronic kidney disease, or unspecified chronic kidney disease: Secondary | ICD-10-CM | POA: Diagnosis not present

## 2020-09-21 DIAGNOSIS — N132 Hydronephrosis with renal and ureteral calculous obstruction: Secondary | ICD-10-CM | POA: Diagnosis not present

## 2020-09-21 DIAGNOSIS — F32A Depression, unspecified: Secondary | ICD-10-CM | POA: Diagnosis not present

## 2020-09-21 DIAGNOSIS — E669 Obesity, unspecified: Secondary | ICD-10-CM

## 2020-09-21 DIAGNOSIS — N170 Acute kidney failure with tubular necrosis: Secondary | ICD-10-CM | POA: Diagnosis not present

## 2020-09-21 DIAGNOSIS — E559 Vitamin D deficiency, unspecified: Secondary | ICD-10-CM | POA: Diagnosis not present

## 2020-09-21 DIAGNOSIS — C9000 Multiple myeloma not having achieved remission: Secondary | ICD-10-CM | POA: Diagnosis not present

## 2020-09-21 DIAGNOSIS — E785 Hyperlipidemia, unspecified: Secondary | ICD-10-CM

## 2020-09-21 DIAGNOSIS — M5442 Lumbago with sciatica, left side: Secondary | ICD-10-CM

## 2020-09-21 DIAGNOSIS — E1121 Type 2 diabetes mellitus with diabetic nephropathy: Secondary | ICD-10-CM

## 2020-09-21 DIAGNOSIS — F419 Anxiety disorder, unspecified: Secondary | ICD-10-CM

## 2020-09-21 DIAGNOSIS — G8929 Other chronic pain: Secondary | ICD-10-CM | POA: Diagnosis not present

## 2020-09-21 DIAGNOSIS — N1832 Chronic kidney disease, stage 3b: Secondary | ICD-10-CM | POA: Diagnosis not present

## 2020-09-21 DIAGNOSIS — F324 Major depressive disorder, single episode, in partial remission: Secondary | ICD-10-CM

## 2020-09-21 DIAGNOSIS — I10 Essential (primary) hypertension: Secondary | ICD-10-CM | POA: Diagnosis not present

## 2020-09-21 DIAGNOSIS — N200 Calculus of kidney: Secondary | ICD-10-CM | POA: Diagnosis not present

## 2020-09-21 MED ORDER — TRAMADOL HCL 50 MG PO TABS: ORAL_TABLET | ORAL | 3 refills | Status: DC

## 2020-09-21 NOTE — Patient Instructions (Addendum)
Annual physical exam  In office with MD 03/22/2021 , call if you need me before  Please get fasting lipid, cmp and eGFR, hBA1C , tSH and vit D and microalb  end June  Covid booster due May 13 or after  Tramadol take on at bedtime for back pain  Think about what you will eat, plan ahead. Choose " clean, green, fresh or frozen" over canned, processed or packaged foods which are more sugary, salty and fatty. 70 to 75% of food eaten should be vegetables and fruit. Three meals at set times with snacks allowed between meals, but they must be fruit or vegetables. Aim to eat over a 12 hour period , example 7 am to 7 pm, and STOP after  your last meal of the day. Drink water,generally about 64 ounces per day, no other drink is as healthy. Fruit juice is best enjoyed in a healthy way, by EATING the fruit.   Thanks for choosing [facility], we consider it a privelige to serve you.

## 2020-09-22 ENCOUNTER — Inpatient Hospital Stay (HOSPITAL_COMMUNITY): Payer: Medicare Other

## 2020-09-22 ENCOUNTER — Other Ambulatory Visit: Payer: Self-pay

## 2020-09-22 ENCOUNTER — Encounter (HOSPITAL_COMMUNITY): Payer: Self-pay

## 2020-09-22 VITALS — BP 123/66 | HR 80 | Temp 96.9°F | Resp 18

## 2020-09-22 DIAGNOSIS — D509 Iron deficiency anemia, unspecified: Secondary | ICD-10-CM

## 2020-09-22 DIAGNOSIS — D508 Other iron deficiency anemias: Secondary | ICD-10-CM

## 2020-09-22 DIAGNOSIS — Z79899 Other long term (current) drug therapy: Secondary | ICD-10-CM | POA: Diagnosis not present

## 2020-09-22 MED ORDER — SODIUM CHLORIDE 0.9 % IV SOLN: INTRAVENOUS | Status: DC

## 2020-09-22 MED ORDER — SODIUM CHLORIDE 0.9 % IV SOLN
300.0000 mg | Freq: Once | INTRAVENOUS | Status: AC
  Administered 2020-09-22: 300 mg via INTRAVENOUS
  Filled 2020-09-22: qty 15

## 2020-09-22 NOTE — Progress Notes (Signed)
Patient arrived today and IV was placed.  Will give one more dose of Venofer 300 mg IVPB x 1 then stop.  Henreitta Leber, PharmD

## 2020-09-22 NOTE — Patient Instructions (Signed)
Cheryl Abbott at [facility]  Discharge Instructions:  You received an iron infusion today.  Keep next appointment.  _______________________________________________________________  Thank you for choosing [place] at [facility] to provide your oncology and hematology care.  To afford each patient quality time with our providers, please arrive at least 15 minutes before your scheduled appointment.  You need to re-schedule your appointment if you arrive 10 or more minutes late.  We strive to give you quality time with our providers, and arriving late affects you and other patients whose appointments are after yours.  Also, if you no show three or more times for appointments you may be dismissed from the clinic.  Again, thank you for choosing [place] [facility] hope is that these requests will allow you access to exceptional care and in a timely manner. _______________________________________________________________  If you have questions after your visit, please contact our office at [phone] between the hours of 8:30 a.m. and 5:00 p.m. Voicemails left after 4:30 p.m. will not be returned until the following business day. _______________________________________________________________  For prescription refill requests, have your pharmacy contact our office. _______________________________________________________________  Recommendations made by the consultant and any test results will be sent to your referring physician. _______________________________________________________________

## 2020-09-22 NOTE — Progress Notes (Signed)
Patient tolerated iron infusion with no complaints voiced.  Peripheral IV site clean and dry with good blood return noted before and after infusion.  Band aid applied.  VSS with discharge and left in satisfactory condition with no s/s of distress noted.   

## 2020-09-24 ENCOUNTER — Encounter: Payer: Self-pay | Admitting: Family Medicine

## 2020-09-24 NOTE — Assessment & Plan Note (Signed)
  Patient re-educated about  the importance of commitment to a  minimum of 150 minutes of exercise per week as able.  The importance of healthy food choices with portion control discussed, as well as eating regularly and within a 12 hour window most days. The need to choose "clean , green" food 50 to 75% of the time is discussed, as well as to make water the primary drink and set a goal of 64 ounces water daily.    Weight /BMI 09/21/2020 08/30/2020 08/16/2020  WEIGHT 196 lb 196 lb 12.8 oz 193 lb  HEIGHT 5\' 7"  - 5' 6.5"  BMI 30.7 kg/m2 31.29 kg/m2 30.68 kg/m2

## 2020-09-24 NOTE — Assessment & Plan Note (Signed)
Controlled, no change in medication  

## 2020-09-24 NOTE — Assessment & Plan Note (Signed)
Cheryl Abbott is reminded of the importance of commitment to daily physical activity for 30 minutes or more, as able and the need to limit carbohydrate intake to 30 to 60 grams per meal to help with blood sugar control.   The need to take medication as prescribed, test blood sugar as directed, and to call between visits if there is a concern that blood sugar is uncontrolled is also discussed.   Cheryl Abbott is reminded of the importance of daily foot exam, annual eye examination, and good blood sugar, blood pressure and cholesterol control.  Diabetic Labs Latest Ref Rng & Units 09/20/2020 08/22/2020 05/13/2020 02/01/2020 11/20/2019  HbA1c 4.8 - 5.6 % - - 6.4(H) - -  Microalbumin mg/dL - - - - 4.7  Micro/Creat Ratio 0.0 - 30.0 mg/g - - - - -  Chol 100 - 199 mg/dL - - 163 - -  HDL >39 mg/dL - - 49 - -  Calc LDL 0 - 99 mg/dL - - 93 - -  Triglycerides 0 - 149 mg/dL - - 120 - -  Creatinine 0.44 - 1.00 mg/dL 2.38(H) 2.18(H) 1.44(H) 1.71(H) -   BP/Weight 09/22/2020 09/21/2020 09/15/2020 09/12/2020 09/09/2020 6/38/9373 09/11/8766  Systolic BP 115 726 203 559 741 638 453  Diastolic BP 66 77 75 66 76 83 72  Wt. (Lbs) - 196 - - - 196.8 193  BMI - 30.7 - - - 31.29 30.68   Foot/eye exam completion dates Latest Ref Rng & Units 01/07/2020 12/28/2019  Eye Exam No Retinopathy - No Retinopathy  Foot exam Order - - -  Foot Form Completion - Done -   Controlled, no change in medication

## 2020-09-24 NOTE — Assessment & Plan Note (Signed)
DASH diet and commitment to daily physical activity for a minimum of 30 minutes discussed and encouraged, as a part of hypertension management. The importance of attaining a healthy weight is also discussed.  BP/Weight 09/22/2020 09/21/2020 09/15/2020 09/12/2020 09/09/2020 3/35/8251 01/18/8420  Systolic BP 031 281 188 677 373 668 159  Diastolic BP 66 77 75 66 76 83 72  Wt. (Lbs) - 196 - - - 196.8 193  BMI - 30.7 - - - 31.29 30.68   Controlled, no change in medication

## 2020-09-24 NOTE — Assessment & Plan Note (Signed)
Hyperlipidemia:Low fat diet discussed and encouraged.   Lipid Panel  Lab Results  Component Value Date   CHOL 163 05/13/2020   HDL 49 05/13/2020   LDLCALC 93 05/13/2020   TRIG 120 05/13/2020   CHOLHDL 3.3 05/13/2020   Controlled, no change in medication

## 2020-09-24 NOTE — Progress Notes (Signed)
Cheryl Abbott     MRN: [id number]      DOB: March 29, 1946   HPI Cheryl Abbott is here for follow up and re-evaluation of chronic medical conditions, medication management and review of any available recent lab and radiology data.  Preventive health is updated, specifically  Cancer screening and Immunization.   Questions or concerns regarding consultations or procedures which the PT has had in the interim are  addressed. The PT denies any adverse reactions to current medications since the last visit.  C/o back pain  C/o lower abdominal pain, has ureteral stent in place due to infection , will have stone and stent removal in the next several weeks  ROS Denies recent fever or chills. Denies sinus pressure, nasal congestion, ear pain or sore throat. Denies chest congestion, productive cough or wheezing. Denies chest pains, palpitations and leg swelling Denies  nausea, vomiting,diarrhea or constipation.   Denies dysuria, frequency, hesitancy or incontinence.  Denies headaches, seizures, numbness, or tingling. Denies uncontrolled depression, anxiety or insomnia. Denies skin break down or rash.   PE  BP 122/77   Pulse 88   Resp 15   Ht 5' 7"  (1.702 m)   Wt 196 lb (88.9 kg)   SpO2 99%   BMI 30.70 kg/m   Patient alert and oriented and in no cardiopulmonary distress.  HEENT: No facial asymmetry, EOMI,     Neck supple .  Chest: Clear to auscultation bilaterally.  CVS: S1, S2 no murmurs, no S3.Regular rate.  ABD: Soft non tender.   Ext: No edema  MS: Adequate though reduced  ROM spine, shoulders, hips and knees.  Skin: Intact, no ulcerations or rash noted.  Psych: Good eye contact, normal affect. Memory intact not anxious or depressed appearing.  CNS: CN 2-12 intact, power,  normal throughout.no focal deficits noted.   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   The need to take medication as prescribed, test blood sugar as directed, and to call between visits if there is a concern that blood sugar is uncontrolled is also discussed.   Cheryl Abbott is reminded of the importance of daily foot exam, annual eye examination, and good blood sugar, blood pressure and cholesterol control.  Diabetic Labs Latest Ref Rng & Units 09/20/2020 08/22/2020 05/13/2020 02/01/2020 11/20/2019  HbA1c 4.8 - 5.6 % - - 6.4(H) - -  Microalbumin mg/dL - - - - 4.7  Micro/Creat Ratio 0.0 - 30.0 mg/g - - - - -  Chol 100 - 199 mg/dL - - 163 - -  HDL >39 mg/dL - - 49 - -  Calc LDL 0 - 99 mg/dL - - 93 - -  Triglycerides 0 - 149 mg/dL - - 120 - -  Creatinine 0.44 - 1.00 mg/dL 2.38(H) 2.18(H) 1.44(H) 1.71(H) -   BP/Weight 09/22/2020 09/21/2020 09/15/2020 09/12/2020 09/09/2020 3/38/2505 08/18/7671  Systolic BP 419 379 024 097 353 299 242  Diastolic BP 66 77 75 66 76 83 72  Wt. (Lbs) - 196 - - - 196.8 193  BMI - 30.7 - - - 31.29 30.68   Foot/eye exam completion dates Latest Ref Rng & Units 01/07/2020 12/28/2019  Eye Exam No Retinopathy - No Retinopathy  Foot exam Order - - -  Foot Form Completion - Done -   Controlled, no change in medication      Obesity (BMI 30.0-34.9)  Patient re-educated about  the importance of commitment to a  minimum of 150 minutes of exercise per week as able.  The importance of healthy food choices with portion control discussed, as well as eating regularly and within a 12 hour window most days. The need to choose "clean , green" food 50 to 75% of the time is discussed, as well as to make water the primary drink and set a goal of 64 ounces water daily.    Weight /BMI 09/21/2020 08/30/2020 08/16/2020  WEIGHT 196 lb 196 lb 12.8 oz 193 lb  HEIGHT 5' 7"  - 5' 6.5"  BMI 30.7 kg/m2 31.29 kg/m2 30.68 kg/m2      Hypertension goal BP (blood  pressure) < 130/80 DASH diet and commitment to daily physical activity for a minimum of 30 minutes discussed and encouraged, as a part of hypertension management. The importance of attaining a healthy weight is also discussed.  BP/Weight 09/22/2020 09/21/2020 09/15/2020 09/12/2020 09/09/2020 09/25/9197 10/16/9007  Systolic BP 200 415 930 123 799 094 000  Diastolic BP 66 77 75 66 76 83 72  Wt. (Lbs) - 196 - - - 196.8 193  BMI - 30.7 - - - 31.29 30.68   Controlled, no change in medication     Hyperlipidemia LDL goal <100 Hyperlipidemia:Low fat diet discussed and encouraged.   Lipid Panel  Lab Results  Component Value Date   CHOL 163 05/13/2020   HDL 49 05/13/2020   LDLCALC 93 05/13/2020   TRIG 120 05/13/2020   CHOLHDL 3.3 05/13/2020   Controlled, no change in medication     Depression, major, single episode, in partial remission (HCC) Controlled, no change in medication   Anxiety and depression Controlled, no change in medication   Multiple myeloma (HCC) Treated by oncology, stable  Low back pain with left-sided sciatica Controlled with tramadol, continue same

## 2020-09-25 NOTE — Assessment & Plan Note (Signed)
Treated by oncology, stable

## 2020-09-25 NOTE — Assessment & Plan Note (Signed)
Controlled with tramadol, continue same

## 2020-09-25 NOTE — Assessment & Plan Note (Signed)
Controlled, no change in medication  

## 2020-09-26 ENCOUNTER — Telehealth: Payer: Self-pay

## 2020-09-26 ENCOUNTER — Ambulatory Visit (HOSPITAL_COMMUNITY): Payer: Medicare Other

## 2020-09-26 DIAGNOSIS — M5412 Radiculopathy, cervical region: Secondary | ICD-10-CM | POA: Diagnosis not present

## 2020-09-26 DIAGNOSIS — M50222 Other cervical disc displacement at C5-C6 level: Secondary | ICD-10-CM | POA: Diagnosis not present

## 2020-09-26 DIAGNOSIS — M25511 Pain in right shoulder: Secondary | ICD-10-CM | POA: Diagnosis not present

## 2020-09-26 DIAGNOSIS — G5601 Carpal tunnel syndrome, right upper limb: Secondary | ICD-10-CM | POA: Diagnosis not present

## 2020-09-26 DIAGNOSIS — M50223 Other cervical disc displacement at C6-C7 level: Secondary | ICD-10-CM | POA: Diagnosis not present

## 2020-09-26 DIAGNOSIS — I10 Essential (primary) hypertension: Secondary | ICD-10-CM | POA: Diagnosis not present

## 2020-09-26 NOTE — Telephone Encounter (Signed)
Patient needing a call back to discuss her upcoming surgery with Dr. Diona Fanti.  Call back:  [phone]  Thanks, Helene Kelp

## 2020-09-26 NOTE — Telephone Encounter (Signed)
Returned call- no answer. Mailbox if full.

## 2020-09-28 ENCOUNTER — Other Ambulatory Visit: Payer: Self-pay

## 2020-09-28 ENCOUNTER — Ambulatory Visit (HOSPITAL_COMMUNITY)
Admission: RE | Admit: 2020-09-28 | Discharge: 2020-09-28 | Disposition: A | Discharge status: Home / Self Care | Payer: Medicare Other | Source: Ambulatory Visit | Attending: Urology | Admitting: Urology

## 2020-09-28 ENCOUNTER — Telehealth: Payer: Self-pay

## 2020-09-28 DIAGNOSIS — N200 Calculus of kidney: Secondary | ICD-10-CM

## 2020-09-28 DIAGNOSIS — N2889 Other specified disorders of kidney and ureter: Secondary | ICD-10-CM | POA: Diagnosis not present

## 2020-09-28 NOTE — Telephone Encounter (Signed)
Pt call and notified appointment made and to get KUB before.

## 2020-09-28 NOTE — Telephone Encounter (Signed)
Pt called again today asking when she was having surgery. Pt has not been seen since last August. Talked with A.L. RN. Pt to have KUB and scheduled next Tuesday with Dr. Diona Fanti. Pt notified of appointment and to get KUB prior.

## 2020-10-04 ENCOUNTER — Encounter: Payer: Self-pay | Admitting: Urology

## 2020-10-04 ENCOUNTER — Other Ambulatory Visit: Payer: Self-pay

## 2020-10-04 ENCOUNTER — Ambulatory Visit (INDEPENDENT_AMBULATORY_CARE_PROVIDER_SITE_OTHER): Payer: Medicare Other | Admitting: Urology

## 2020-10-04 VITALS — BP 117/75 | HR 101 | Temp 98.3°F

## 2020-10-04 DIAGNOSIS — L02224 Furuncle of groin: Secondary | ICD-10-CM

## 2020-10-04 DIAGNOSIS — N200 Calculus of kidney: Secondary | ICD-10-CM | POA: Diagnosis not present

## 2020-10-04 MED ORDER — DOXYCYCLINE HYCLATE 100 MG PO TABS: 100.0000 mg | ORAL_TABLET | Freq: Two times a day (BID) | ORAL | 0 refills | Status: DC

## 2020-10-04 NOTE — Progress Notes (Signed)
History of Present Illness: Cheryl Abbott is a 75 y.o. year old female presenting for followup of urolithiasis.  9.14.2021: Underwent LT URS/HLL/stone extraction, J2 stent.  4.26.2022: Recent renal U/S-moderate Lt hydronephrosis, ? LT renal pelvic stone KUB--multiple Lt distal ureteral stones  She is having no left-sided flank pain.  She has no gross hematuria.  She does feel like she might have a urinary tract fraction.  Past Medical History:  Diagnosis Date  . Abnormal brain scan 02/07/2012  . Anemia   . Anxiety   . Anxiety disorder   . Arthritis   . Brain TIA 08/19/2015  . Cancer ([place])    acute myeloma  . 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 Chronic back pain   . Depression   . Fatty liver   . GERD (gastroesophageal reflux disease)   . Headache   . HEMORRHOIDS 03/15/2009   OCT 2014 HB 12. 9    . History of adenomatous polyp of colon    2008  . History of kidney stones   . Hypercalcemia   . Hypercalcemia 11/30/2010  . Hyperlipidemia   . Hypertension   . Lactose intolerance in adult 02/01/2016  . Left ureteral stone   . Lipoma of arm 01/19/2015  . Lumbar disc disease with radiculopathy   . Nephrolithiasis   . Neuropathic pain of both feet 04/11/2016  . Neuropathy, peripheral   . Nocturnal hypoxia 02/04/2014   Study confirms this done 02/10/2014   . OSA treated with BiPAP    per study 2007  . RENAL CALCULUS 03/15/2009   Qualifier: Diagnosis of  By: Zeb Comfort    . Sciatica   . Shoulder pain, bilateral   . Sigmoid diverticulosis   . TIA (transient ischemic attack) 02/26/2016  . Type 2 diabetes mellitus ([place])   . Vaginal dryness, menopausal 10/14/2016    Past Surgical History:  Procedure Laterality Date  . BIOPSY N/A 03/17/2013   Procedure: GASTRIC BIOPSIES;  Surgeon: Danie Binder, MD;  Location: [facility];  Service: Endoscopy;  Laterality: N/A;  . BIOPSY  06/10/2015   Procedure: BIOPSY;  Surgeon: Danie Binder, MD;  [facility];  Service:  Endoscopy;;  gastric biopsy  . CARDIAC CATHETERIZATION  05-11-2003  dr Shelva Majestic ([facility])   Abnormal cardiolite/   Normal coronary arteries and normal LVF,  ef  63%  . CARDIOVASCULAR STRESS TEST  01-01-2014   normal lexiscan cardiolite/  no ischemia/ infarct/  normal LV function and wall motion ,  ef 81%  . COLONOSCOPY  last one 10/02/2011   MOD [place] [facility], IH, NEXT TCS APR 2018  . COLONOSCOPY WITH PROPOFOL N/A 02/26/2017   Procedure: COLONOSCOPY WITH PROPOFOL;  Surgeon: Danie Binder, MD;  [facility];  Service: Endoscopy;  Laterality: N/A;  11:00am  . COLONOSCOPY WITH PROPOFOL N/A 09/22/2019   Procedure: COLONOSCOPY WITH PROPOFOL;  Surgeon: Danie Binder, MD;  [facility];  Service: Endoscopy;  Laterality: N/A;  1:15  . CYSTO/   RIGHT URETEROSOCOPY LASER LITHOTRIPSY STONE EXTRACTION  10-13-2004  . CYSTO/  RIGHT RETROGRADE PYELOGRAM/  PLACMENT RIGHT URETERAL STENT  01-10-2010  . CYSTOSCOPY W/ URETERAL STENT PLACEMENT Right 08/19/2015   Procedure: CYSTOSCOPY WITH RETROGRADE PYELOGRAM/URETERAL STENT PLACEMENT;  Surgeon: Franchot Gallo, MD;  [facility]: [facility];  Service: Urology;  Laterality: Right;  . CYSTOSCOPY W/ URETERAL STENT PLACEMENT Left 02/23/2020   Procedure: CYSTOSCOPY LEFT  RETROGRADE PYELOGRAM LEFT URETEROSCOPY URETERAL STENT PLACEMENT;  Surgeon: Diona Fanti,  Annie Main, MD;  Location: [facility];  Service: Urology;  Laterality: Left;  . CYSTOSCOPY WITH RETROGRADE PYELOGRAM, URETEROSCOPY AND STENT PLACEMENT Left 10/21/2014   Procedure: CYSTOSCOPY WITH RETROGRADE PYELOGRAM, URETEROSCOPY AND STENT PLACEMENT;  Surgeon: Franchot Gallo, MD;  Location: [facility];  Service: Urology;  Laterality: Left;  . CYSTOSCOPY WITH RETROGRADE PYELOGRAM, URETEROSCOPY AND STENT PLACEMENT Right 11/22/2015   Procedure: CYSTOSCOPY, RIGHT URETERAL STENT REMOVAL; RIGHT RETROGRADE PYELOGRAM, RIGHT URETEROSCOPY WITH BALLOON DILATION; RIGHT URETERAL STENT  PLACEMENT;  Surgeon: Franchot Gallo, MD;  [facility];  Service: Urology;  Laterality: Right;  . CYSTOSCOPY/RETROGRADE/URETEROSCOPY/STONE EXTRACTION WITH BASKET Bilateral 08/02/2015   Procedure: CYSTOSCOPY; BILATERAL RETROGRADE PYELOGRAMS; BILATERAL URETEROSCOPY, STONE EXTRACTION WITH BASKET;  Surgeon: Franchot Gallo, MD;  [facility];  Service: Urology;  Laterality: Bilateral;  . CYSTOSCOPY/RETROGRADE/URETEROSCOPY/STONE EXTRACTION WITH BASKET Right 06/28/2015   Procedure: CYSTOSCOPY/RETROGRADE/URETEROSCOPY/STONE EXTRACTION WITH BASKET, RIGHT URETERAL DOUBLE J STENT PLACEMENT;  Surgeon: Franchot Gallo, MD;  [facility];  Service: Urology;  Laterality: Right;  . ESOPHAGOGASTRODUODENOSCOPY (EGD) WITH PROPOFOL N/A 03/17/2013   Procedure: ESOPHAGOGASTRODUODENOSCOPY (EGD) WITH PROPOFOL;  Surgeon: Danie Binder, MD;  [facility];  Service: Endoscopy;  Laterality: N/A;  . ESOPHAGOGASTRODUODENOSCOPY (EGD) WITH PROPOFOL N/A 06/10/2015   Distal gastritis, distal esophageal stricture s/p dilation  . ESOPHAGOGASTRODUODENOSCOPY (EGD) WITH PROPOFOL N/A 02/26/2017   Procedure: ESOPHAGOGASTRODUODENOSCOPY (EGD) WITH PROPOFOL;  Surgeon: Danie Binder, MD;  [facility];  Service: Endoscopy;  Laterality: N/A;  . FLEXIBLE SIGMOIDOSCOPY  09/11/2011   WJX:BJYNWGNF Internal hemorrhoids  . HOLMIUM LASER APPLICATION Left 11/29/3084   Procedure: HOLMIUM LASER APPLICATION;  Surgeon: Franchot Gallo, MD;  Location[facility];  Service: Urology;  Laterality: Left;  . HOLMIUM LASER APPLICATION Right 5/78/4696   Procedure: HOLMIUM LASER APPLICATION;  Surgeon: Franchot Gallo, MD;  [facility];  Service: Urology;  Laterality: Right;  . HOLMIUM LASER APPLICATION  2/95/2841   Procedure: HOLMIUM LASER APPLICATION;  Surgeon: Franchot Gallo, MD;  [facility];  Service: Urology;;  . MASS EXCISION Left 03/04/2015   Procedure: EXCISION OF SOFT TISSUE NEOPLASM LEFT  ARM;  Surgeon: Aviva Signs, MD;  Location: [facility];  Service: General;  Laterality: Left;  . PERCUTANEOUS NEPHROSTOLITHOTOMY Bilateral 12/  2011     [facility]  . POLYPECTOMY N/A 03/17/2013   Procedure: GASTRIC POLYPECTOMY;  Surgeon: Danie Binder, MD;  [facility];  Service: Endoscopy;  Laterality: N/A;  . POLYPECTOMY  02/26/2017   Procedure: POLYPECTOMY;  Surgeon: Danie Binder, MD;  [facility];  Service: Endoscopy;;  polyp at cecum, ascending colon polyps x3, hepatic flexure polyps x8, transverse colon polyps x8   . POLYPECTOMY  09/22/2019   Procedure: POLYPECTOMY;  Surgeon: Danie Binder, MD;  [facility];  Service: Endoscopy;;  . REMOVAL RIGHT THIGH CYST  2006  . SAVORY DILATION N/A 03/17/2013   Procedure: SAVORY DILATION;  Surgeon: Danie Binder, MD;  [facility];  Service: Endoscopy;  Laterality: N/A;  #12.8, 14, 15, 16 dilators used  . SAVORY DILATION N/A 06/10/2015   Procedure: SAVORY DILATION;  Surgeon: Danie Binder, MD;  [facility];  Service: Endoscopy;  Laterality: N/A;  . TRANSTHORACIC ECHOCARDIOGRAM  01-01-2014   mild LVH/  ef 32-44%/  grade I diastolic dysfunction/  trivial MR, TR, and PR  . VAGINAL HYSTERECTOMY  1970's    Home Medications:  (Not in a hospital admission)   Allergies:  Allergies  Allergen Reactions  .  Ace Inhibitors Cough  . Keflex [Cephalexin] Diarrhea and Nausea And Vomiting  . Nitrofurantoin Diarrhea and Nausea And Vomiting  . Penicillins Hives and Other (See Comments)    Reaction:  Blisters on hands/feet  Has patient had a PCN reaction causing immediate rash, facial/tongue/throat swelling, SOB or lightheadedness with hypotension: No Has patient had a PCN reaction causing severe rash involving mucus membranes or skin necrosis: No Has patient had a PCN reaction that required hospitalization No Has patient had a PCN reaction occurring within the last 10 years: No If all of the above answers are "NO",  then may proceed with Cephalosporin use.    Family History  Problem Relation Age of Onset  . Hypertension Mother   . Diabetes Mother   . Heart failure Mother   . Dementia Mother   . Emphysema Father   . Hypertension Father   . Diabetes Brother   . GER disease Brother   . Hypertension Sister   . Hypertension Sister   . Cancer Other        family history   . Diabetes Other        family history   . Heart defect Other        famiily history   . Arthritis Other        family history   . Anesthesia problems Neg Hx   . Hypotension Neg Hx   . Malignant hyperthermia Neg Hx   . Pseudochol deficiency Neg Hx   . Colon cancer Neg Hx     Social History:  reports that she quit smoking about 32 years ago. Her smoking use included cigarettes. She started smoking about 46 years ago. She has a 20.00 pack-year smoking history. She has never used smokeless tobacco. She reports that she does not drink alcohol and does not use drugs.  ROS: A complete review of systems was performed.  All systems are negative except for pertinent findings as noted.  Physical Exam:  Vital signs in last 24 hours: @VSRANGES @ General:  Alert and oriented, No acute distress HEENT: Normocephalic, atraumatic Neck: No JVD or lymphadenopathy Cardiovascular: Regular rate  Lungs: Normal inspiratory/expiratory excursion Abdomen: Soft, nontender, nondistended, no abdominal masses Back: No CVA tenderness Extremities: No edema Neurologic: Grossly intact  She has a small furuncle in her left groin skin crease.  This is not on fluctuant.  I have reviewed prior pt notes  I have reviewed notes from referring/previous physicians  I have reviewed urinalysis results  I have independently reviewed prior imaging--images from both recent renal ultrasound and KUB reviewed with the patient    Impression/Assessment:  1.  Small furuncle in left groin crease, I do not think this needs to be incised and drained  2.  Left  ureteral calculi with left hydronephrosis.  She does have increased creatinine  Plan:  1.  I will send her out with doxycycline for 5 days  2.  I did discuss cystoscopy, retrograde, ureteroscopy with laser lithotripsy and stone extraction with stent placement with the patient.  She has had these done before.  She would prefer to have this done in [place], I will have this scheduled with Dr. Katrine Coho M Aaleeyah Bias 10/04/2020, 8:14 AM  Lillette Boxer. Amari Zagal MD

## 2020-10-04 NOTE — Progress Notes (Signed)
Urological Symptom Review ° °Patient is experiencing the following symptoms: °Burning/pain with urination °Get up at night to urinate ° ° °Review of Systems ° °Gastrointestinal (upper)  : °Negative for upper GI symptoms ° °Gastrointestinal (lower) : °Negative for lower GI symptoms ° °Constitutional : °Negative for symptoms ° °Skin: °Negative for skin symptoms ° °Eyes: °Negative for eye symptoms ° °Ear/Nose/Throat : °Negative for Ear/Nose/Throat symptoms ° °Hematologic/Lymphatic: °Negative for Hematologic/Lymphatic symptoms ° °Cardiovascular : °Negative for cardiovascular symptoms ° °Respiratory : °Negative for respiratory symptoms ° °Endocrine: °Negative for endocrine symptoms ° °Musculoskeletal: °Negative for musculoskeletal symptoms ° °Neurological: °Negative for neurological symptoms ° °Psychologic: °Negative for psychiatric symptoms °

## 2020-10-05 ENCOUNTER — Other Ambulatory Visit: Payer: Self-pay

## 2020-10-05 ENCOUNTER — Other Ambulatory Visit: Payer: Medicare Other

## 2020-10-05 DIAGNOSIS — N201 Calculus of ureter: Secondary | ICD-10-CM | POA: Diagnosis not present

## 2020-10-05 DIAGNOSIS — E785 Hyperlipidemia, unspecified: Secondary | ICD-10-CM | POA: Diagnosis not present

## 2020-10-05 DIAGNOSIS — N200 Calculus of kidney: Secondary | ICD-10-CM | POA: Diagnosis not present

## 2020-10-05 DIAGNOSIS — E1121 Type 2 diabetes mellitus with diabetic nephropathy: Secondary | ICD-10-CM | POA: Diagnosis not present

## 2020-10-05 DIAGNOSIS — E559 Vitamin D deficiency, unspecified: Secondary | ICD-10-CM | POA: Diagnosis not present

## 2020-10-05 LAB — URINALYSIS, ROUTINE W REFLEX MICROSCOPIC
Bilirubin, UA: NEGATIVE
Glucose, UA: NEGATIVE
Ketones, UA: NEGATIVE
Nitrite, UA: NEGATIVE
Specific Gravity, UA: 1.015 (ref 1.005–1.030)
Urobilinogen, Ur: 0.2 mg/dL (ref 0.2–1.0)
pH, UA: 7 (ref 5.0–7.5)

## 2020-10-05 LAB — MICROSCOPIC EXAMINATION

## 2020-10-06 ENCOUNTER — Ambulatory Visit: Payer: Medicare Other

## 2020-10-06 ENCOUNTER — Encounter: Payer: Self-pay | Admitting: Orthopedic Surgery

## 2020-10-06 ENCOUNTER — Ambulatory Visit (INDEPENDENT_AMBULATORY_CARE_PROVIDER_SITE_OTHER): Payer: Medicare Other | Admitting: Orthopedic Surgery

## 2020-10-06 ENCOUNTER — Other Ambulatory Visit: Payer: Self-pay

## 2020-10-06 VITALS — BP 131/86 | HR 100 | Ht 67.0 in | Wt 192.0 lb

## 2020-10-06 DIAGNOSIS — M79601 Pain in right arm: Secondary | ICD-10-CM

## 2020-10-06 DIAGNOSIS — M75121 Complete rotator cuff tear or rupture of right shoulder, not specified as traumatic: Secondary | ICD-10-CM

## 2020-10-06 DIAGNOSIS — G8929 Other chronic pain: Secondary | ICD-10-CM

## 2020-10-06 DIAGNOSIS — M25511 Pain in right shoulder: Secondary | ICD-10-CM

## 2020-10-06 LAB — CMP14+EGFR
ALT: 13 IU/L (ref 0–32)
AST: 22 IU/L (ref 0–40)
Albumin/Globulin Ratio: 1.2 (ref 1.2–2.2)
Albumin: 4.5 g/dL (ref 3.7–4.7)
Alkaline Phosphatase: 101 IU/L (ref 44–121)
BUN/Creatinine Ratio: 8 — ABNORMAL LOW (ref 12–28)
BUN: 18 mg/dL (ref 8–27)
Bilirubin Total: <0.2 mg/dL (ref 0.0–1.2)
CO2: 18 mmol/L — ABNORMAL LOW (ref 20–29)
Calcium: 10.3 mg/dL (ref 8.7–10.3)
Chloride: 101 mmol/L (ref 96–106)
Creatinine, Ser: 2.34 mg/dL — ABNORMAL HIGH (ref 0.57–1.00)
Globulin, Total: 3.9 g/dL (ref 1.5–4.5)
Glucose: 128 mg/dL — ABNORMAL HIGH (ref 65–99)
Potassium: 4.8 mmol/L (ref 3.5–5.2)
Sodium: 141 mmol/L (ref 134–144)
Total Protein: 8.4 g/dL (ref 6.0–8.5)
eGFR: 21 mL/min/{1.73_m2} — ABNORMAL LOW (ref >59)

## 2020-10-06 LAB — LIPID PANEL
Chol/HDL Ratio: 3.5 ratio (ref 0.0–4.4)
Cholesterol, Total: 156 mg/dL (ref 100–199)
HDL: 45 mg/dL (ref >39)
LDL Chol Calc (NIH): 83 mg/dL (ref 0–99)
Triglycerides: 165 mg/dL — ABNORMAL HIGH (ref 0–149)
VLDL Cholesterol Cal: 28 mg/dL (ref 5–40)

## 2020-10-06 LAB — TSH: TSH: 2.11 u[IU]/mL (ref 0.450–4.500)

## 2020-10-06 LAB — HEMOGLOBIN A1C
Est. average glucose Bld gHb Est-mCnc: 137 mg/dL
Hgb A1c MFr Bld: 6.4 % — ABNORMAL HIGH (ref 4.8–5.6)

## 2020-10-06 LAB — VITAMIN D 25 HYDROXY (VIT D DEFICIENCY, FRACTURES): Vit D, 25-Hydroxy: 39.9 ng/mL (ref 30.0–100.0)

## 2020-10-06 LAB — MICROALBUMIN / CREATININE URINE RATIO

## 2020-10-06 NOTE — Progress Notes (Signed)
Chief Complaint  Patient presents with  . Shoulder Pain    Right     Records reviewed Dr. Vertell Limber he is concerned about a carpal tunnel syndrome is gone to get a nerve study  He thinks she does have some cervical disease but may not be a surgical candidate  Patient has a history of multiple myeloma and presents with arm pain right upper extremity swelling of the right distal humerus area  Still having quite a bit of shoulder pain pain with abduction weakness with abduction and decreased flexion painful forward elevation as well  Repeat examination shows swelling and tenderness at the biceps and also in the bicipital groove question whether she had a biceps tendon tear although there is no history of a snap or pop.  There is history of chronic rotator cuff disease  She has limited abduction and flexion has pain with flexion past 90 degrees and has weakness in supraspinatus position empty can as well as drop arm positive signs.  No instability is noted.  X-rays of the humerus were normal  Prior films were done of the shoulder already  Recommend MRI of the right shoulder.  We expect to find a rotator cuff tear of the right shoulder at which time we could advise whether or not surgery is advisable  Encounter Diagnoses  Name Primary?  . Arm pain, right Yes  . Chronic right shoulder pain   . Nontraumatic complete tear of right rotator cuff

## 2020-10-06 NOTE — Patient Instructions (Addendum)
While we are working on your approval for MRI please go ahead and call to schedule your appointment with [facility] within at least one (1) week.   Central Scheduling [phone]   Warm compresses to arm twice daily for 5 days

## 2020-10-10 LAB — URINE CULTURE

## 2020-10-11 ENCOUNTER — Telehealth: Payer: Self-pay

## 2020-10-11 IMAGING — MG DIGITAL SCREENING BILATERAL MAMMOGRAM WITH TOMO AND CAD
6 of 10 series · 6 of 30 positions shown · non-contrast
Comparison: Previous exam(s).

CLINICAL DATA: Screening.

EXAM:
DIGITAL SCREENING BILATERAL MAMMOGRAM WITH TOMO AND CAD

[L CV synth-2D]
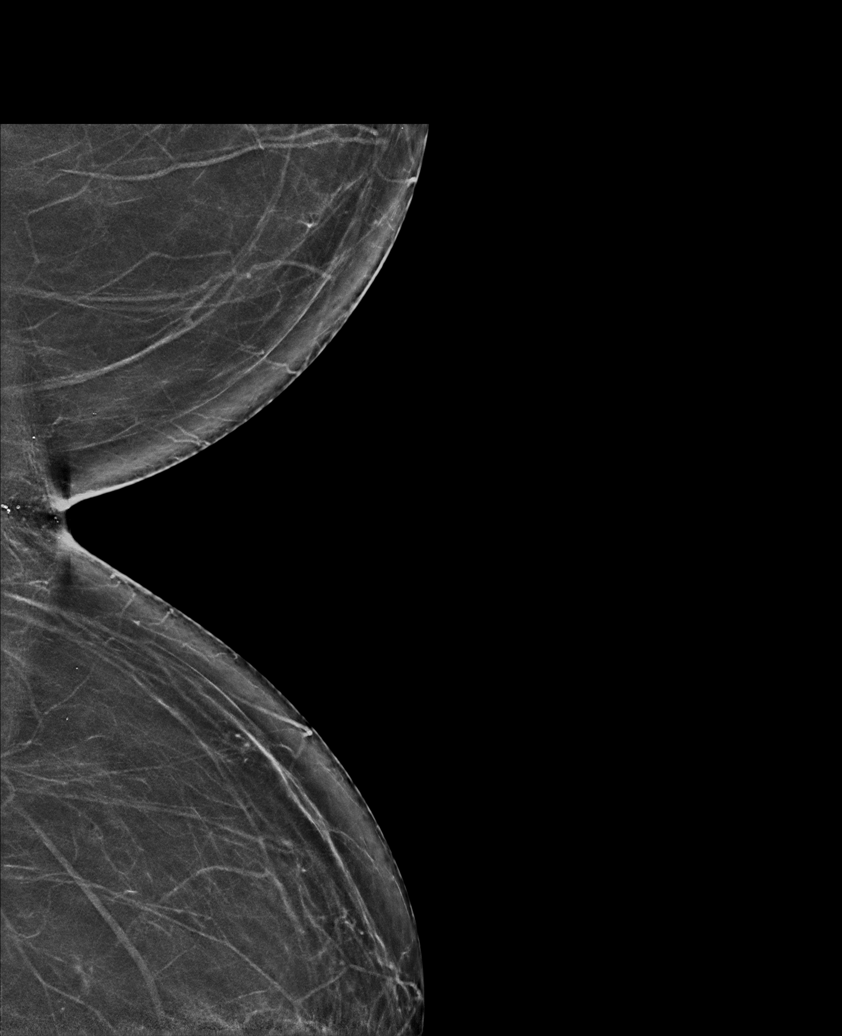

[L MLO synth-2D]
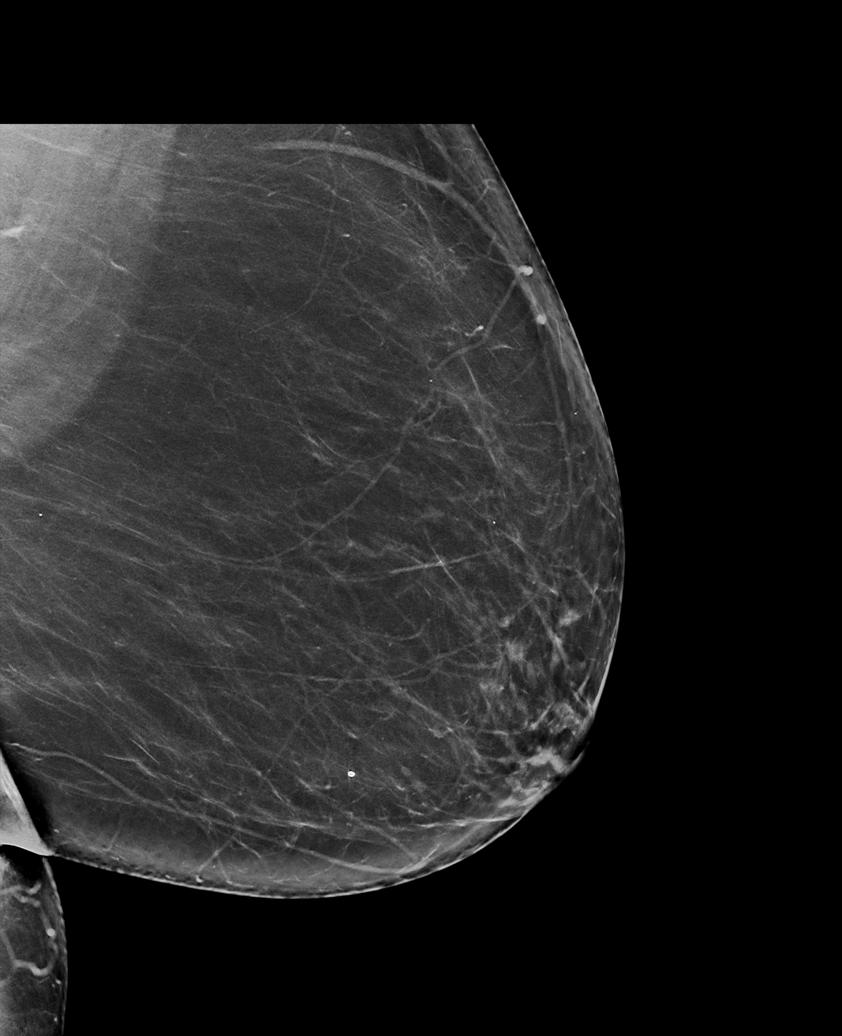

[R CC synth-2D]
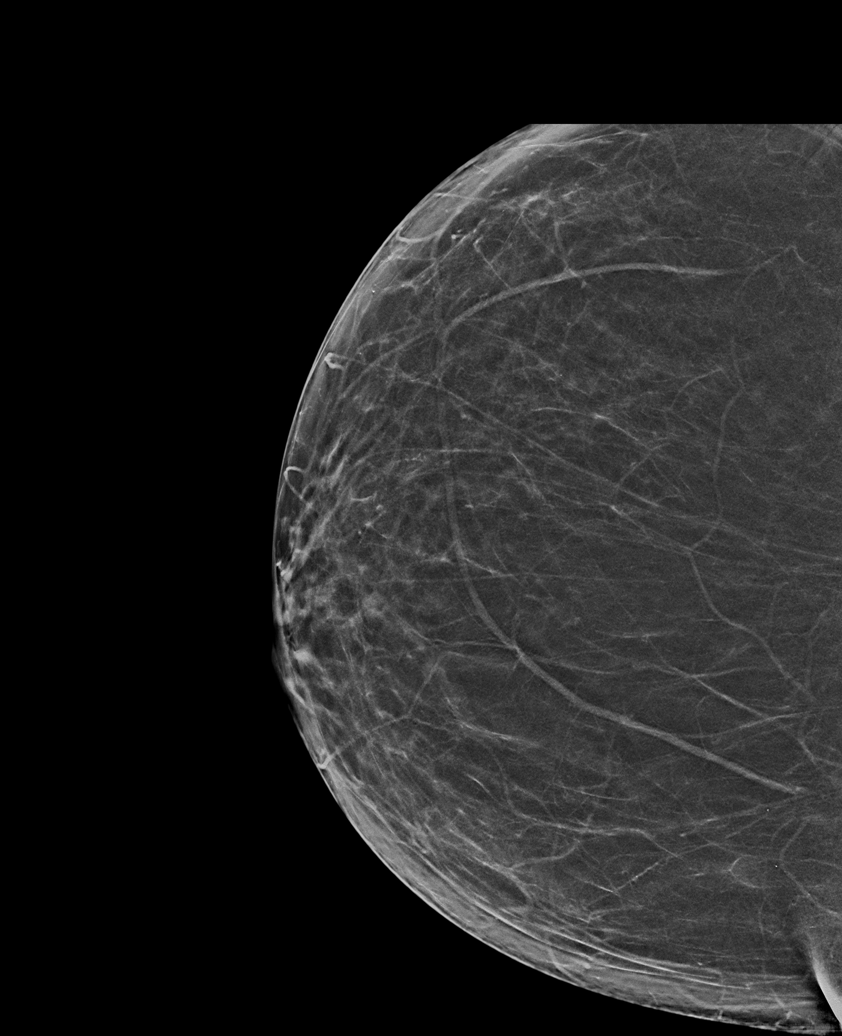

[R MLO synth-2D]
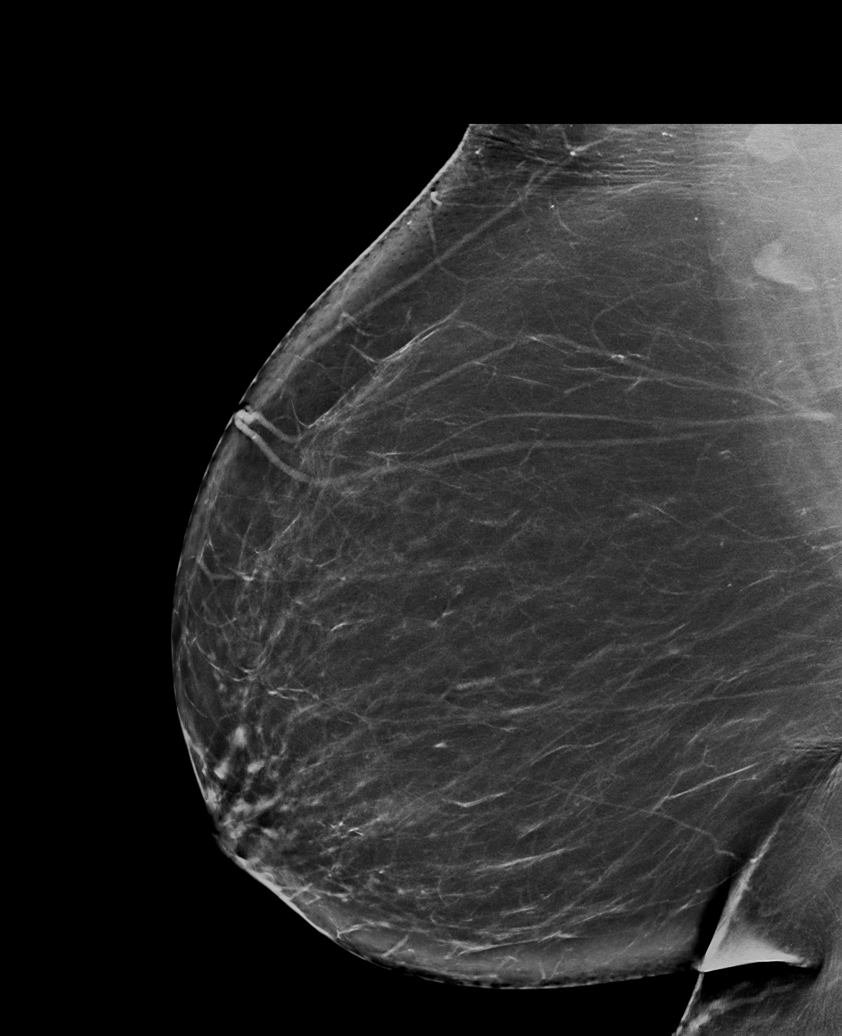

[L CC synth-2D]
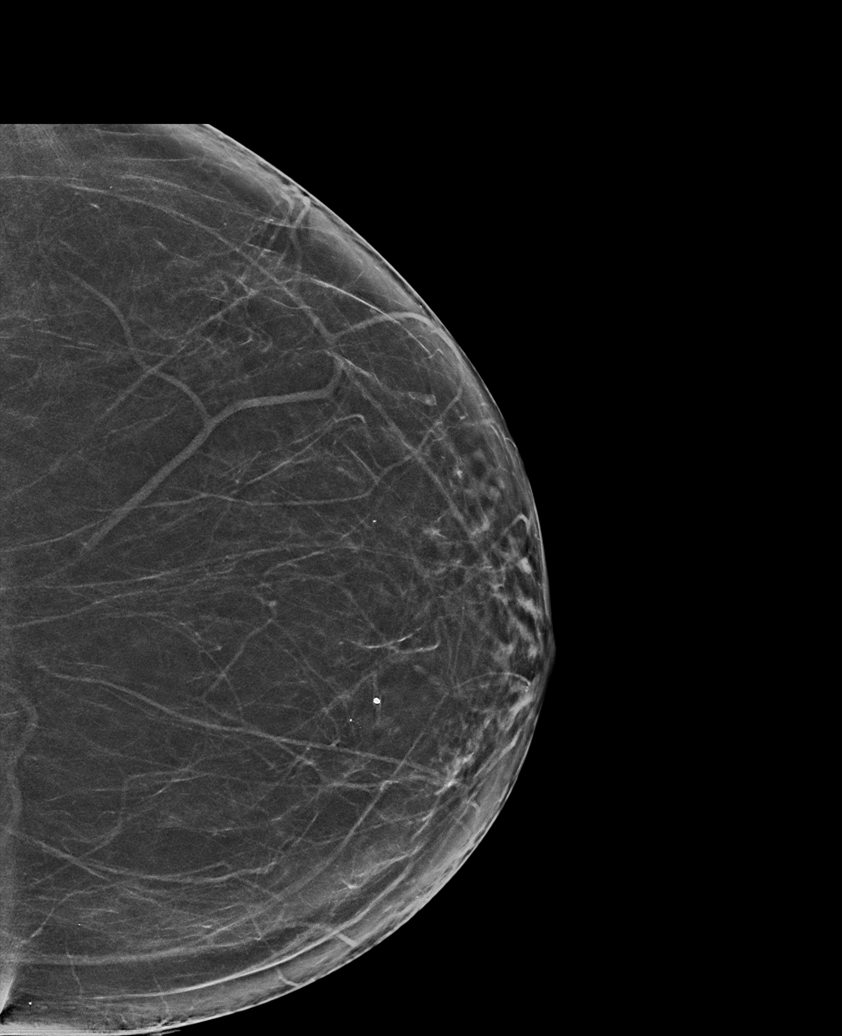

[R CC tomo · tomo slice 35/68.0]
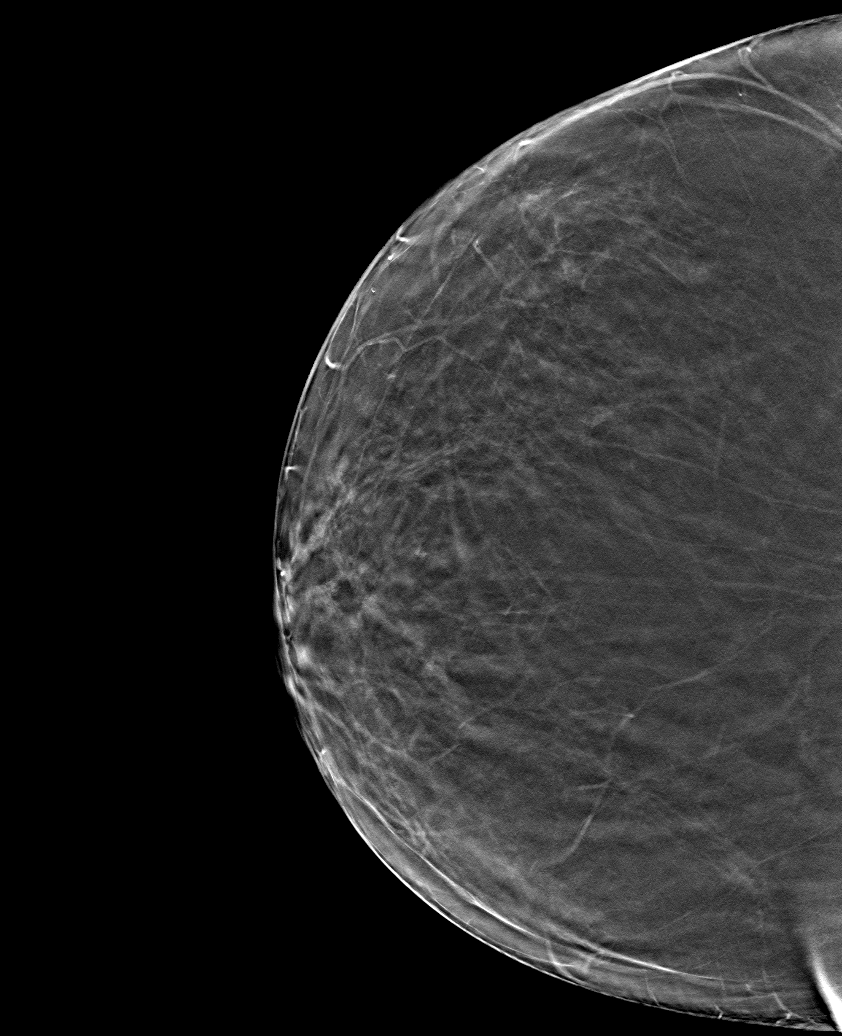

[6 of 30 positions shown; findings below may reference images not displayed]

ACR Breast Density Category b: There are scattered areas of
fibroglandular density.
FINDINGS: There are no findings suspicious for malignancy. Images were
processed with CAD.
IMPRESSION: No mammographic evidence of malignancy. A result letter of this
screening mammogram will be mailed directly to the patient.

RECOMMENDATION:
Screening mammogram in one year. (Code:[id number])

BI-RADS CATEGORY  1: Negative.

## 2020-10-11 NOTE — Telephone Encounter (Signed)
Wants to know if surgery will be done and cost.  Call back: [phone]  Thanks, Helene Kelp

## 2020-10-13 NOTE — Telephone Encounter (Signed)
Patient called back- pre op appt confirmed with patient. Patient voiced understanding.

## 2020-10-13 NOTE — Patient Instructions (Signed)
Cheryl Abbott  10/13/2020     @PREFPERIOPPHARMACY @   Your procedure is scheduled on  10/18/2020.   Report to [facility] at  1030  A.M.   Call this number if you have problems the morning of surgery:  [phone]   Remember:  Do not eat or drink after midnight.                       Take these medicines the morning of surgery with A SIP OF WATER  Amlodipine, gabapentin, hydrocodone(if needed), protonix, paxil, tramadol (if needed).   Use your inhaler before you come and bring your rescue inhaler with you.  DO NOT take any medications for diabetes the morning of your procedure.  If your glucose is 70 or below the morning of your procedure, drink 1/2 cup of clear liquid containing sugar and recheck your glucose in 15 minutes. If your glucose is still 70 or below, call [phone] for instructions.  If your glucose is 300 or above the morning of your procedure the morning of your procedure, call [phone] for instructions.      Do not wear jewelry, make-up or nail polish.  Do not wear lotions, powders, or perfumes, or deodorant.  Do not shave 48 hours prior to surgery.  Men may shave face and neck.  Do not bring valuables to the hospital.  [facility] is not responsible for any belongings or valuables.    Place clean sheets on your bed the night before your procedure and DO NOT sleep with pets this night.  Shower with CHG the night before and the morning of your procedure. DO NOT put CHG on your face, hair or genitals.  After each shower, dry off with a clean towel, put on clean, comfortable clothes and brush your teeth.    Contacts, dentures or bridgework may not be worn into surgery.  Leave your suitcase in the car.  After surgery it may be brought to your room.  For patients admitted to the hospital, discharge time will be determined by your treatment team.  Patients discharged the day of surgery will not be allowed to drive home and must have  someone with them for 24 hours.    DO NOT smoke tobacco or vape for 24 hours before your procedure.     Please read over the following fact sheets that you were given. Anesthesia Post-op Instructions and Care and Recovery After Surgery       Ureteral Stent Implantation, Care After This sheet gives you information about how to care for yourself after your procedure. Your health care provider may also give you more specific instructions. If you have problems or questions, contact your health care provider. What can I expect after the procedure? After the procedure, it is common to have:  Nausea.  Mild pain when you urinate. You may feel this pain in your lower back or lower abdomen. The pain should stop within a few minutes after you urinate. This may last for up to 1 week.  A small amount of blood in your urine for several days. Follow these instructions at home: Medicines  Take over-the-counter and prescription medicines only as told by your health care provider.  If you were prescribed an antibiotic medicine, take it as told by your health care provider. Do not stop taking the antibiotic even if you start to feel better.  Do not drive for 24 hours if  you were given a sedative during your procedure.  Ask your health care provider if the medicine prescribed to you requires you to avoid driving or using heavy machinery. Activity  Rest as told by your health care provider.  Avoid sitting for a long time without moving. Get up to take short walks every 1-2 hours. This is important to improve blood flow and breathing. Ask for help if you feel weak or unsteady.  Return to your normal activities as told by your health care provider. Ask your health care provider what activities are safe for you. General instructions  Watch for any blood in your urine. Call your health care provider if the amount of blood in your urine increases.  If you have a catheter: ? Follow instructions  from your health care provider about taking care of your catheter and collection bag. ? Do not take baths, swim, or use a hot tub until your health care provider approves. Ask your health care provider if you may take showers. You may only be allowed to take sponge baths.  Drink enough fluid to keep your urine pale yellow.  Do not use any products that contain nicotine or tobacco, such as cigarettes, e-cigarettes, and chewing tobacco. These can delay healing after surgery. If you need help quitting, ask your health care provider.  Keep all follow-up visits as told by your health care provider. This is important.   Contact a health care provider if:  You have pain that gets worse or does not get better with medicine, especially pain when you urinate.  You have difficulty urinating.  You feel nauseous or you vomit repeatedly during a period of more than 2 days after the procedure. Get help right away if:  Your urine is dark red or has blood clots in it.  You are leaking urine (have incontinence).  The end of the stent comes out of your urethra.  You cannot urinate.  You have sudden, sharp, or severe pain in your abdomen or lower back.  You have a fever.  You have swelling or pain in your legs.  You have difficulty breathing. Summary  After the procedure, it is common to have mild pain when you urinate that goes away within a few minutes after you urinate. This may last for up to 1 week.  Watch for any blood in your urine. Call your health care provider if the amount of blood in your urine increases.  Take over-the-counter and prescription medicines only as told by your health care provider.  Drink enough fluid to keep your urine pale yellow. This information is not intended to replace advice given to you by your health care provider. Make sure you discuss any questions you have with your health care provider. Document Revised: 03/04/2018 Document Reviewed: 03/05/2018 Elsevier  Patient Education  2021 [employer] Anesthesia, Adult, Care After This sheet gives you information about how to care for yourself after your procedure. Your health care provider may also give you more specific instructions. If you have problems or questions, contact your health care provider. What can I expect after the procedure? After the procedure, the following side effects are common:  Pain or discomfort at the IV site.  Nausea.  Vomiting.  Sore throat.  Trouble concentrating.  Feeling cold or chills.  Feeling weak or tired.  Sleepiness and fatigue.  Soreness and body aches. These side effects can affect parts of the body that were not involved in surgery. Follow these instructions  at home: For the time period you were told by your health care provider:  Rest.  Do not participate in activities where you could fall or become injured.  Do not drive or use machinery.  Do not drink alcohol.  Do not take sleeping pills or medicines that cause drowsiness.  Do not make important decisions or sign legal documents.  Do not take care of children on your own.   Eating and drinking  Follow any instructions from your health care provider about eating or drinking restrictions.  When you feel hungry, start by eating small amounts of foods that are soft and easy to digest (bland), such as toast. Gradually return to your regular diet.  Drink enough fluid to keep your urine pale yellow.  If you vomit, rehydrate by drinking water, juice, or clear broth. General instructions  If you have sleep apnea, surgery and certain medicines can increase your risk for breathing problems. Follow instructions from your health care provider about wearing your sleep device: ? Anytime you are sleeping, including during daytime naps. ? While taking prescription pain medicines, sleeping medicines, or medicines that make you drowsy.  Have a responsible adult stay with you for the time  you are told. It is important to have someone help care for you until you are awake and alert.  Return to your normal activities as told by your health care provider. Ask your health care provider what activities are safe for you.  Take over-the-counter and prescription medicines only as told by your health care provider.  If you smoke, do not smoke without supervision.  Keep all follow-up visits as told by your health care provider. This is important. Contact a health care provider if:  You have nausea or vomiting that does not get better with medicine.  You cannot eat or drink without vomiting.  You have pain that does not get better with medicine.  You are unable to pass urine.  You develop a skin rash.  You have a fever.  You have redness around your IV site that gets worse. Get help right away if:  You have difficulty breathing.  You have chest pain.  You have blood in your urine or stool, or you vomit blood. Summary  After the procedure, it is common to have a sore throat or nausea. It is also common to feel tired.  Have a responsible adult stay with you for the time you are told. It is important to have someone help care for you until you are awake and alert.  When you feel hungry, start by eating small amounts of foods that are soft and easy to digest (bland), such as toast. Gradually return to your regular diet.  Drink enough fluid to keep your urine pale yellow.  Return to your normal activities as told by your health care provider. Ask your health care provider what activities are safe for you. This information is not intended to replace advice given to you by your health care provider. Make sure you discuss any questions you have with your health care provider. Document Revised: 02/11/2020 Document Reviewed: 09/10/2019 Elsevier Patient Education  2021 [employer].

## 2020-10-14 ENCOUNTER — Encounter (HOSPITAL_COMMUNITY)
Admission: RE | Admit: 2020-10-14 | Discharge: 2020-10-14 | Disposition: A | Discharge status: Home / Self Care | Payer: Medicare Other | Source: Ambulatory Visit | Attending: Urology | Admitting: Urology

## 2020-10-14 ENCOUNTER — Encounter (HOSPITAL_COMMUNITY): Payer: Self-pay

## 2020-10-14 ENCOUNTER — Other Ambulatory Visit (HOSPITAL_COMMUNITY)
Admission: RE | Admit: 2020-10-14 | Discharge: 2020-10-14 | Disposition: A | Discharge status: Home / Self Care | Payer: Medicare Other | Source: Ambulatory Visit | Attending: Urology | Admitting: Urology

## 2020-10-14 ENCOUNTER — Other Ambulatory Visit: Payer: Self-pay

## 2020-10-14 DIAGNOSIS — Z20822 Contact with and (suspected) exposure to covid-19: Secondary | ICD-10-CM | POA: Diagnosis not present

## 2020-10-14 DIAGNOSIS — Z01812 Encounter for preprocedural laboratory examination: Secondary | ICD-10-CM | POA: Diagnosis not present

## 2020-10-14 LAB — SARS CORONAVIRUS 2 (TAT 6-24 HRS): SARS Coronavirus 2: NEGATIVE

## 2020-10-15 IMAGING — DX DG ABDOMEN ACUTE W/ 1V CHEST
4 series · 4 of 4 positions shown · non-contrast
Comparison: None.

CLINICAL DATA: Abdominal pain and distention.

EXAM:
DG ABDOMEN ACUTE W/ 1V CHEST

[chest pa]
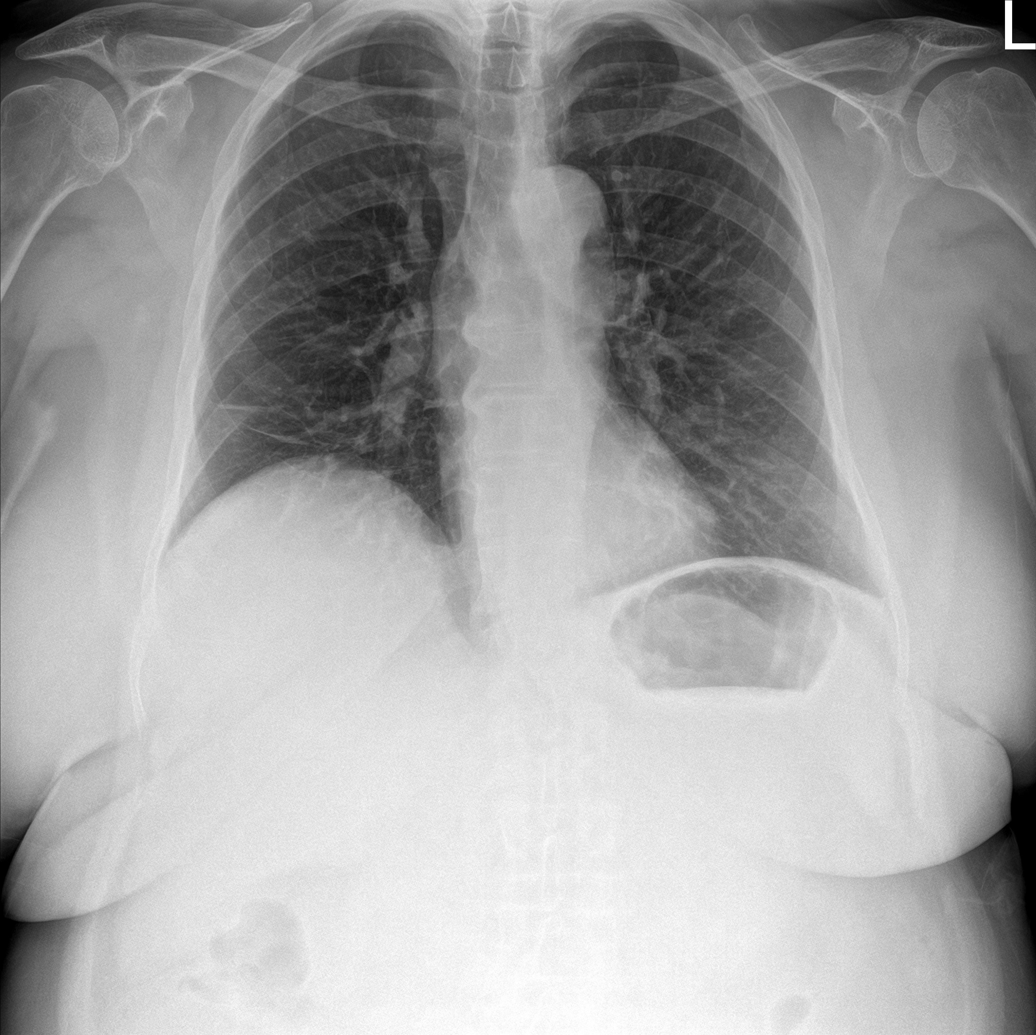

[abdomen erect]
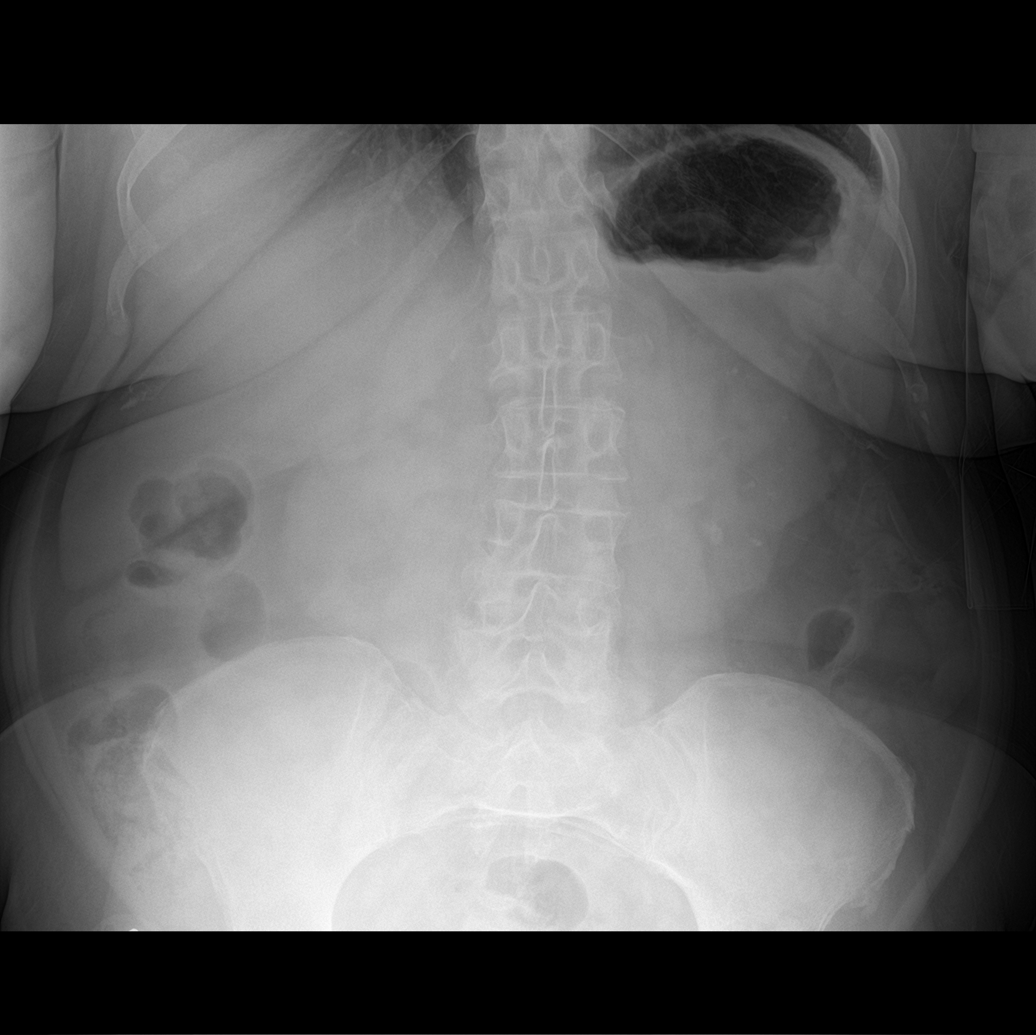

[abdomen supine (1 of 2)]
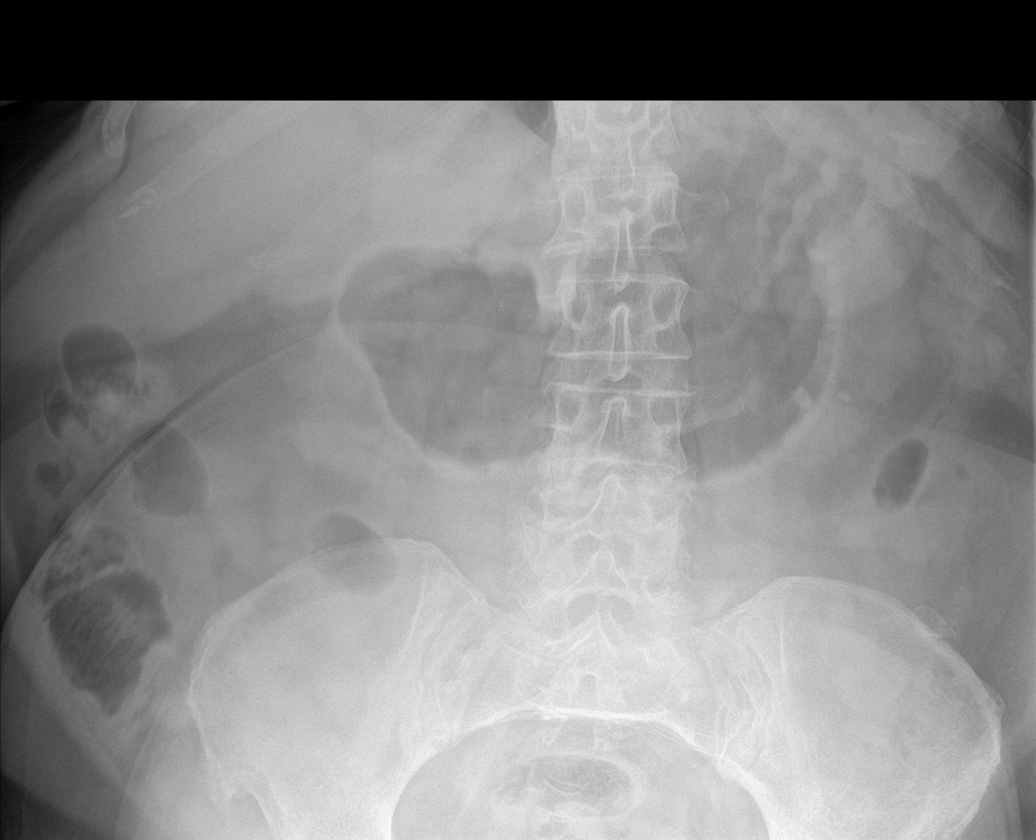

[abdomen supine (2 of 2)]
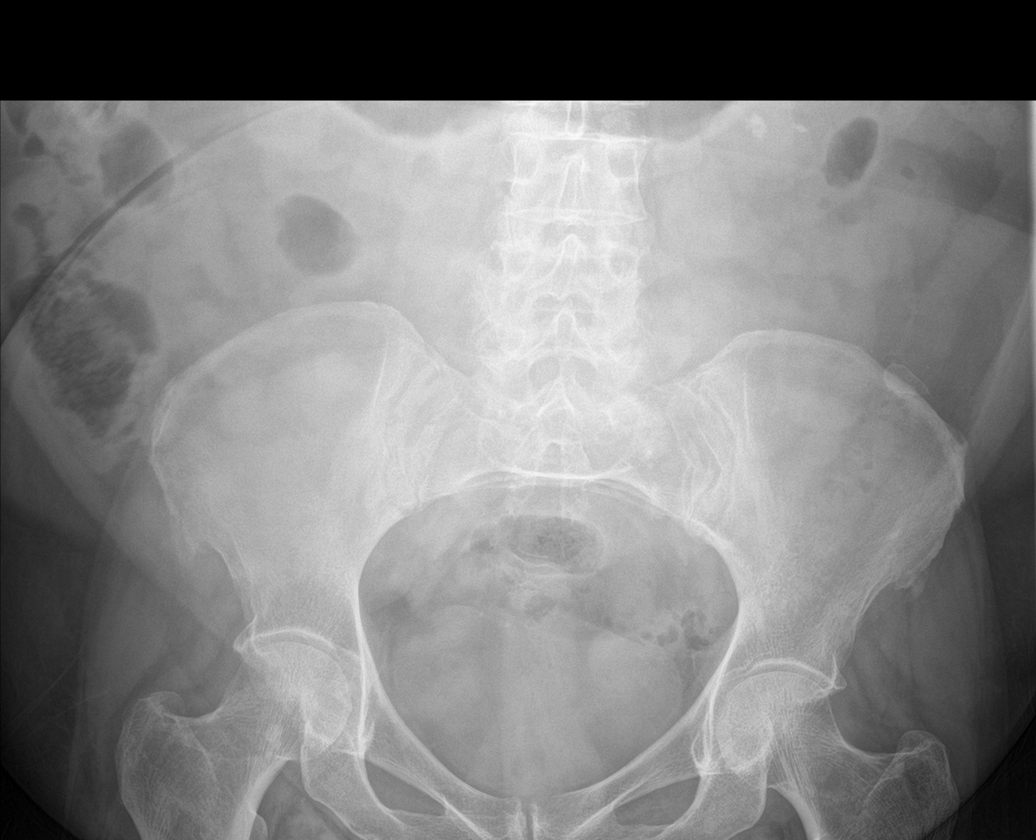

[4 of 4 positions shown; findings below may reference images not displayed]

FINDINGS: There is no evidence of dilated bowel loops or free intraperitoneal
air. Left renal calculi are noted. Heart size and mediastinal
contours are within normal limits. Both lungs are clear.
IMPRESSION: No evidence of bowel obstruction or ileus. Left nephrolithiasis. No
acute cardiopulmonary abnormality seen.

## 2020-10-17 DIAGNOSIS — R0902 Hypoxemia: Secondary | ICD-10-CM | POA: Diagnosis not present

## 2020-10-18 ENCOUNTER — Telehealth: Payer: Self-pay

## 2020-10-18 ENCOUNTER — Encounter (HOSPITAL_COMMUNITY)
Admission: RE | Disposition: A | Discharge status: Home / Self Care | Payer: Self-pay | Source: Home / Self Care | Attending: Urology

## 2020-10-18 ENCOUNTER — Ambulatory Visit (HOSPITAL_COMMUNITY)
Admission: RE | Admit: 2020-10-18 | Discharge: 2020-10-18 | Disposition: A | Discharge status: Home / Self Care | Payer: Medicare Other | Attending: Urology | Admitting: Urology

## 2020-10-18 ENCOUNTER — Ambulatory Visit (HOSPITAL_COMMUNITY): Payer: Medicare Other | Admitting: Anesthesiology

## 2020-10-18 ENCOUNTER — Ambulatory Visit (HOSPITAL_COMMUNITY): Payer: Medicare Other

## 2020-10-18 DIAGNOSIS — Z87891 Personal history of nicotine dependence: Secondary | ICD-10-CM | POA: Diagnosis not present

## 2020-10-18 DIAGNOSIS — Z880 Allergy status to penicillin: Secondary | ICD-10-CM | POA: Diagnosis not present

## 2020-10-18 DIAGNOSIS — Z86010 Personal history of colonic polyps: Secondary | ICD-10-CM | POA: Insufficient documentation

## 2020-10-18 DIAGNOSIS — N1330 Unspecified hydronephrosis: Secondary | ICD-10-CM | POA: Diagnosis not present

## 2020-10-18 DIAGNOSIS — Z881 Allergy status to other antibiotic agents status: Secondary | ICD-10-CM | POA: Insufficient documentation

## 2020-10-18 DIAGNOSIS — Z8719 Personal history of other diseases of the digestive system: Secondary | ICD-10-CM | POA: Insufficient documentation

## 2020-10-18 DIAGNOSIS — N132 Hydronephrosis with renal and ureteral calculous obstruction: Secondary | ICD-10-CM | POA: Diagnosis not present

## 2020-10-18 DIAGNOSIS — E785 Hyperlipidemia, unspecified: Secondary | ICD-10-CM | POA: Insufficient documentation

## 2020-10-18 DIAGNOSIS — Z8379 Family history of other diseases of the digestive system: Secondary | ICD-10-CM | POA: Diagnosis not present

## 2020-10-18 DIAGNOSIS — N201 Calculus of ureter: Secondary | ICD-10-CM

## 2020-10-18 DIAGNOSIS — Z8579 Personal history of other malignant neoplasms of lymphoid, hematopoietic and related tissues: Secondary | ICD-10-CM | POA: Diagnosis not present

## 2020-10-18 DIAGNOSIS — Z7984 Long term (current) use of oral hypoglycemic drugs: Secondary | ICD-10-CM | POA: Insufficient documentation

## 2020-10-18 DIAGNOSIS — Z8249 Family history of ischemic heart disease and other diseases of the circulatory system: Secondary | ICD-10-CM | POA: Insufficient documentation

## 2020-10-18 DIAGNOSIS — Z8673 Personal history of transient ischemic attack (TIA), and cerebral infarction without residual deficits: Secondary | ICD-10-CM | POA: Diagnosis not present

## 2020-10-18 DIAGNOSIS — Z90710 Acquired absence of both cervix and uterus: Secondary | ICD-10-CM | POA: Insufficient documentation

## 2020-10-18 DIAGNOSIS — Z79899 Other long term (current) drug therapy: Secondary | ICD-10-CM | POA: Insufficient documentation

## 2020-10-18 DIAGNOSIS — Z809 Family history of malignant neoplasm, unspecified: Secondary | ICD-10-CM | POA: Diagnosis not present

## 2020-10-18 DIAGNOSIS — Z7982 Long term (current) use of aspirin: Secondary | ICD-10-CM | POA: Diagnosis not present

## 2020-10-18 DIAGNOSIS — Z833 Family history of diabetes mellitus: Secondary | ICD-10-CM | POA: Insufficient documentation

## 2020-10-18 DIAGNOSIS — Z7951 Long term (current) use of inhaled steroids: Secondary | ICD-10-CM | POA: Diagnosis not present

## 2020-10-18 DIAGNOSIS — K760 Fatty (change of) liver, not elsewhere classified: Secondary | ICD-10-CM | POA: Insufficient documentation

## 2020-10-18 DIAGNOSIS — I10 Essential (primary) hypertension: Secondary | ICD-10-CM | POA: Diagnosis not present

## 2020-10-18 DIAGNOSIS — E1140 Type 2 diabetes mellitus with diabetic neuropathy, unspecified: Secondary | ICD-10-CM | POA: Diagnosis not present

## 2020-10-18 DIAGNOSIS — Z888 Allergy status to other drugs, medicaments and biological substances status: Secondary | ICD-10-CM | POA: Diagnosis not present

## 2020-10-18 HISTORY — PX: CYSTOSCOPY WITH RETROGRADE PYELOGRAM, URETEROSCOPY AND STENT PLACEMENT: SHX5789

## 2020-10-18 HISTORY — PX: HOLMIUM LASER APPLICATION: SHX5852

## 2020-10-18 LAB — GLUCOSE, CAPILLARY
Glucose-Capillary: 97 mg/dL (ref 70–99)
Glucose-Capillary: 110 mg/dL — ABNORMAL HIGH (ref 70–99)

## 2020-10-18 SURGERY — CYSTOURETEROSCOPY, WITH RETROGRADE PYELOGRAM AND STENT INSERTION
Anesthesia: General | Site: Ureter | Laterality: Left

## 2020-10-18 MED ORDER — ROCURONIUM 10MG/ML (10ML) SYRINGE FOR MEDFUSION PUMP - OPTIME
INTRAVENOUS | Status: DC | PRN
  Administered 2020-10-18: 50 mg via INTRAVENOUS

## 2020-10-18 MED ORDER — CHLORHEXIDINE GLUCONATE 0.12 % MT SOLN
15.0000 mL | Freq: Once | OROMUCOSAL | Status: AC
  Administered 2020-10-18: 15 mL via OROMUCOSAL

## 2020-10-18 MED ORDER — CHLORHEXIDINE GLUCONATE 0.12 % MT SOLN
OROMUCOSAL | Status: AC
  Filled 2020-10-18: qty 15

## 2020-10-18 MED ORDER — SODIUM CHLORIDE 0.9 % IR SOLN
Status: DC | PRN
  Administered 2020-10-18 (×2): 3000 mL

## 2020-10-18 MED ORDER — CIPROFLOXACIN HCL 250 MG PO TABS: 250.0000 mg | ORAL_TABLET | Freq: Two times a day (BID) | ORAL | 0 refills | Status: DC

## 2020-10-18 MED ORDER — ORAL CARE MOUTH RINSE: 15.0000 mL | Freq: Once | OROMUCOSAL | Status: AC

## 2020-10-18 MED ORDER — DEXAMETHASONE SODIUM PHOSPHATE 10 MG/ML IJ SOLN
INTRAMUSCULAR | Status: DC | PRN
  Administered 2020-10-18: 10 mg via INTRAVENOUS

## 2020-10-18 MED ORDER — LIDOCAINE HCL (CARDIAC) PF 50 MG/5ML IV SOSY
PREFILLED_SYRINGE | INTRAVENOUS | Status: DC | PRN
  Administered 2020-10-18: 20 mg via INTRAVENOUS
  Administered 2020-10-18: 80 mg via INTRAVENOUS

## 2020-10-18 MED ORDER — ONDANSETRON HCL 4 MG/2ML IJ SOLN
INTRAMUSCULAR | Status: DC | PRN
  Administered 2020-10-18: 4 mg via INTRAVENOUS

## 2020-10-18 MED ORDER — FENTANYL CITRATE (PF) 100 MCG/2ML IJ SOLN
INTRAMUSCULAR | Status: DC | PRN
  Administered 2020-10-18 (×4): 50 ug via INTRAVENOUS

## 2020-10-18 MED ORDER — METOCLOPRAMIDE HCL 5 MG/ML IJ SOLN
INTRAMUSCULAR | Status: DC | PRN
  Administered 2020-10-18: 10 mg via INTRAVENOUS

## 2020-10-18 MED ORDER — CIPROFLOXACIN IN D5W 400 MG/200ML IV SOLN
INTRAVENOUS | Status: AC
  Filled 2020-10-18: qty 200

## 2020-10-18 MED ORDER — FENTANYL CITRATE (PF) 100 MCG/2ML IJ SOLN
INTRAMUSCULAR | Status: AC
  Filled 2020-10-18: qty 2

## 2020-10-18 MED ORDER — ONDANSETRON HCL 4 MG/2ML IJ SOLN
INTRAMUSCULAR | Status: AC
  Filled 2020-10-18: qty 2

## 2020-10-18 MED ORDER — LIDOCAINE HCL (PF) 2 % IJ SOLN
INTRAMUSCULAR | Status: AC
  Filled 2020-10-18: qty 5

## 2020-10-18 MED ORDER — DIATRIZOATE MEGLUMINE 30 % UR SOLN
URETHRAL | Status: DC | PRN
  Administered 2020-10-18: 16 mL via URETHRAL

## 2020-10-18 MED ORDER — SUCCINYLCHOLINE 20MG/ML (10ML) SYRINGE FOR MEDFUSION PUMP - OPTIME
INTRAMUSCULAR | Status: DC | PRN
  Administered 2020-10-18: 120 mg via INTRAVENOUS

## 2020-10-18 MED ORDER — CIPROFLOXACIN IN D5W 400 MG/200ML IV SOLN
400.0000 mg | INTRAVENOUS | Status: AC
  Administered 2020-10-18: 400 mg via INTRAVENOUS

## 2020-10-18 MED ORDER — ONDANSETRON HCL 4 MG/2ML IJ SOLN
4.0000 mg | Freq: Once | INTRAMUSCULAR | Status: AC | PRN
  Administered 2020-10-18: 4 mg via INTRAVENOUS
  Filled 2020-10-18: qty 2

## 2020-10-18 MED ORDER — DIATRIZOATE MEGLUMINE 30 % UR SOLN
URETHRAL | Status: AC
  Filled 2020-10-18: qty 100

## 2020-10-18 MED ORDER — SUGAMMADEX SODIUM 200 MG/2ML IV SOLN
INTRAVENOUS | Status: DC | PRN
  Administered 2020-10-18: 500 mg via INTRAVENOUS

## 2020-10-18 MED ORDER — LACTATED RINGERS IV SOLN: INTRAVENOUS | Status: DC

## 2020-10-18 MED ORDER — PROPOFOL 10 MG/ML IV BOLUS
INTRAVENOUS | Status: AC
  Filled 2020-10-18: qty 40

## 2020-10-18 MED ORDER — PROPOFOL 10 MG/ML IV BOLUS
INTRAVENOUS | Status: DC | PRN
  Administered 2020-10-18: 200 mg via INTRAVENOUS
  Administered 2020-10-18: 100 mg via INTRAVENOUS

## 2020-10-18 MED ORDER — FENTANYL CITRATE (PF) 100 MCG/2ML IJ SOLN
25.0000 ug | INTRAMUSCULAR | Status: DC | PRN
  Administered 2020-10-18: 25 ug via INTRAVENOUS
  Filled 2020-10-18: qty 2

## 2020-10-18 MED ORDER — WATER FOR IRRIGATION, STERILE IR SOLN
Status: DC | PRN
  Administered 2020-10-18: 500 mL

## 2020-10-18 MED ORDER — SEVOFLURANE IN SOLN
RESPIRATORY_TRACT | Status: AC
  Filled 2020-10-18: qty 250

## 2020-10-18 SURGICAL SUPPLY — 30 items
BAG DRN RND TRDRP ANRFLXCHMBR (UROLOGICAL SUPPLIES)
BAG URINE DRAIN 2000ML AR STRL (UROLOGICAL SUPPLIES) IMPLANT
CATH FOLEY 2WAY SLVR  5CC 22FR (CATHETERS)
CATH FOLEY 2WAY SLVR 5CC 22FR (CATHETERS) IMPLANT
CATH INTERMIT  6FR 70CM (CATHETERS) ×2 IMPLANT
CATH URET 5FR 28IN OPEN ENDED (CATHETERS) ×1 IMPLANT
CLOTH BEACON ORANGE TIMEOUT ST (SAFETY) ×2 IMPLANT
CNTNR URN SCR LID CUP LEK RST (MISCELLANEOUS) IMPLANT
CONT SPEC 4OZ STRL OR WHT (MISCELLANEOUS) ×2
DECANTER SPIKE VIAL GLASS SM (MISCELLANEOUS) ×2 IMPLANT
EXTRACTOR STONE NITINOL NGAGE (UROLOGICAL SUPPLIES) ×2 IMPLANT
FIBER LASER FLEXIVA 200 (UROLOGICAL SUPPLIES) IMPLANT
FIBER LASER FLEXIVA 365 (UROLOGICAL SUPPLIES) ×1 IMPLANT
GLOVE BIO SURGEON STRL SZ8 (GLOVE) ×2 IMPLANT
GLOVE SURG UNDER POLY LF SZ7 (GLOVE) ×2 IMPLANT
GOWN STRL REUS W/TWL LRG LVL3 (GOWN DISPOSABLE) ×2 IMPLANT
GOWN STRL REUS W/TWL XL LVL3 (GOWN DISPOSABLE) ×2 IMPLANT
GUIDEWIRE STR DUAL SENSOR (WIRE) ×2 IMPLANT
GUIDEWIRE STR ZIPWIRE 035X150 (MISCELLANEOUS) ×2 IMPLANT
IV NS IRRIG 3000ML ARTHROMATIC (IV SOLUTION) ×4 IMPLANT
KIT TURNOVER CYSTO (KITS) ×2 IMPLANT
MANIFOLD NEPTUNE II (INSTRUMENTS) ×2 IMPLANT
PACK CYSTO (CUSTOM PROCEDURE TRAY) ×2 IMPLANT
PAD ARMBOARD 7.5X6 YLW CONV (MISCELLANEOUS) ×3 IMPLANT
SHEATH ACCESS URETERAL 24CM (SHEATH) ×1 IMPLANT
SHEATH URETERAL 12FRX35CM (MISCELLANEOUS) IMPLANT
STENT URET 6FRX26 CONTOUR (STENTS) ×1 IMPLANT
SYR 10ML LL (SYRINGE) ×2 IMPLANT
TOWEL OR 17X26 4PK STRL BLUE (TOWEL DISPOSABLE) ×2 IMPLANT
WATER STERILE IRR 500ML POUR (IV SOLUTION) ×1 IMPLANT

## 2020-10-18 NOTE — H&P (Signed)
H&P  Chief Complaint: Kidney stones  History of Present Illness: Cheryl Abbott is a 75 y.o. year old female with history of urolithiasis presenting today for ureteroscopic management of left distal ureteral calculi with significant hydronephrosis.  Past Medical History:  Diagnosis Date  . Abnormal brain scan 02/07/2012  . Anemia   . Anxiety   . Anxiety disorder   . Arthritis   . Brain TIA 08/19/2015  . Cancer ([place])    acute myeloma  . 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 Chronic back pain   . Depression   . Fatty liver   . GERD (gastroesophageal reflux disease)   . Headache   . HEMORRHOIDS 03/15/2009   OCT 2014 HB 12. 9    . History of adenomatous polyp of colon    2008  . History of kidney stones   . Hypercalcemia   . Hypercalcemia 11/30/2010  . Hyperlipidemia   . Hypertension   . Lactose intolerance in adult 02/01/2016  . Left ureteral stone   . Lipoma of arm 01/19/2015  . Lumbar disc disease with radiculopathy   . Nephrolithiasis   . Neuropathic pain of both feet 04/11/2016  . Neuropathy, peripheral   . Nocturnal hypoxia 02/04/2014   Study confirms this done 02/10/2014   . OSA treated with BiPAP    per study 2007  . RENAL CALCULUS 03/15/2009   Qualifier: Diagnosis of  By: Zeb Comfort    . Sciatica   . Shoulder pain, bilateral   . Sigmoid diverticulosis   . TIA (transient ischemic attack) 02/26/2016  . Type 2 diabetes mellitus ([place])   . Vaginal dryness, menopausal 10/14/2016    Past Surgical History:  Procedure Laterality Date  . BIOPSY N/A 03/17/2013   Procedure: GASTRIC BIOPSIES;  Surgeon: Danie Binder, MD;  Location: [facility];  Service: Endoscopy;  Laterality: N/A;  . BIOPSY  06/10/2015   Procedure: BIOPSY;  Surgeon: Danie Binder, MD;  [facility];  Service: Endoscopy;;  gastric biopsy  . CARDIAC CATHETERIZATION  05-11-2003  dr Shelva Majestic ([facility])   Abnormal cardiolite/   Normal coronary arteries and normal LVF,  ef   63%  . CARDIOVASCULAR STRESS TEST  01-01-2014   normal lexiscan cardiolite/  no ischemia/ infarct/  normal LV function and wall motion ,  ef 81%  . COLONOSCOPY  last one 10/02/2011   MOD [place] TICS, IH, NEXT TCS APR 2018  . COLONOSCOPY WITH PROPOFOL N/A 02/26/2017   Procedure: COLONOSCOPY WITH PROPOFOL;  Surgeon: Danie Binder, MD;  [facility];  Service: Endoscopy;  Laterality: N/A;  11:00am  . COLONOSCOPY WITH PROPOFOL N/A 09/22/2019   Procedure: COLONOSCOPY WITH PROPOFOL;  Surgeon: Danie Binder, MD;  [facility];  Service: Endoscopy;  Laterality: N/A;  1:15  . CYSTO/   RIGHT URETEROSOCOPY LASER LITHOTRIPSY STONE EXTRACTION  10-13-2004  . CYSTO/  RIGHT RETROGRADE PYELOGRAM/  PLACMENT RIGHT URETERAL STENT  01-10-2010  . CYSTOSCOPY W/ URETERAL STENT PLACEMENT Right 08/19/2015   Procedure: CYSTOSCOPY WITH RETROGRADE PYELOGRAM/URETERAL STENT PLACEMENT;  Surgeon: Franchot Gallo, MD;  Location: [facility];  Service: Urology;  Laterality: Right;  . CYSTOSCOPY W/ URETERAL STENT PLACEMENT Left 02/23/2020   Procedure: CYSTOSCOPY LEFT  RETROGRADE PYELOGRAM LEFT URETEROSCOPY URETERAL STENT PLACEMENT;  Surgeon: Franchot Gallo, MD;  Location: [facility];  Service: Urology;  Laterality: Left;  . CYSTOSCOPY WITH RETROGRADE PYELOGRAM, URETEROSCOPY AND STENT PLACEMENT Left 10/21/2014   Procedure: CYSTOSCOPY WITH RETROGRADE PYELOGRAM, URETEROSCOPY  AND STENT PLACEMENT;  Surgeon: Franchot Gallo, MD;  Location[facility];  Service: Urology;  Laterality: Left;  . CYSTOSCOPY WITH RETROGRADE PYELOGRAM, URETEROSCOPY AND STENT PLACEMENT Right 11/22/2015   Procedure: CYSTOSCOPY, RIGHT URETERAL STENT REMOVAL; RIGHT RETROGRADE PYELOGRAM, RIGHT URETEROSCOPY WITH BALLOON DILATION; RIGHT URETERAL STENT PLACEMENT;  Surgeon: Franchot Gallo, MD;  [facility];  Service: Urology;  Laterality: Right;  . CYSTOSCOPY/RETROGRADE/URETEROSCOPY/STONE EXTRACTION WITH BASKET Bilateral 08/02/2015    Procedure: CYSTOSCOPY; BILATERAL RETROGRADE PYELOGRAMS; BILATERAL URETEROSCOPY, STONE EXTRACTION WITH BASKET;  Surgeon: Franchot Gallo, MD;  [facility];  Service: Urology;  Laterality: Bilateral;  . CYSTOSCOPY/RETROGRADE/URETEROSCOPY/STONE EXTRACTION WITH BASKET Right 06/28/2015   Procedure: CYSTOSCOPY/RETROGRADE/URETEROSCOPY/STONE EXTRACTION WITH BASKET, RIGHT URETERAL DOUBLE J STENT PLACEMENT;  Surgeon: Franchot Gallo, MD;  [facility];  Service: Urology;  Laterality: Right;  . ESOPHAGOGASTRODUODENOSCOPY (EGD) WITH PROPOFOL N/A 03/17/2013   Procedure: ESOPHAGOGASTRODUODENOSCOPY (EGD) WITH PROPOFOL;  Surgeon: Danie Binder, MD;  [facility];  Service: Endoscopy;  Laterality: N/A;  . ESOPHAGOGASTRODUODENOSCOPY (EGD) WITH PROPOFOL N/A 06/10/2015   Distal gastritis, distal esophageal stricture s/p dilation  . ESOPHAGOGASTRODUODENOSCOPY (EGD) WITH PROPOFOL N/A 02/26/2017   Procedure: ESOPHAGOGASTRODUODENOSCOPY (EGD) WITH PROPOFOL;  Surgeon: Danie Binder, MD;  [facility];  Service: Endoscopy;  Laterality: N/A;  . FLEXIBLE SIGMOIDOSCOPY  09/11/2011   WHQ:PRFFMBWG Internal hemorrhoids  . HOLMIUM LASER APPLICATION Left 6/65/9935   Procedure: HOLMIUM LASER APPLICATION;  Surgeon: Franchot Gallo, MD;  Location[facility];  Service: Urology;  Laterality: Left;  . HOLMIUM LASER APPLICATION Right 12/10/7791   Procedure: HOLMIUM LASER APPLICATION;  Surgeon: Franchot Gallo, MD;  [facility];  Service: Urology;  Laterality: Right;  . HOLMIUM LASER APPLICATION  02/12/91   Procedure: HOLMIUM LASER APPLICATION;  Surgeon: Franchot Gallo, MD;  [facility];  Service: Urology;;  . MASS EXCISION Left 03/04/2015   Procedure: EXCISION OF SOFT TISSUE NEOPLASM LEFT ARM;  Surgeon: Aviva Signs, MD;  Location: [facility];  Service: General;  Laterality: Left;  . PERCUTANEOUS NEPHROSTOLITHOTOMY Bilateral 12/  2011     [facility]  . POLYPECTOMY N/A 03/17/2013    Procedure: GASTRIC POLYPECTOMY;  Surgeon: Danie Binder, MD;  [facility];  Service: Endoscopy;  Laterality: N/A;  . POLYPECTOMY  02/26/2017   Procedure: POLYPECTOMY;  Surgeon: Danie Binder, MD;  [facility];  Service: Endoscopy;;  polyp at cecum, ascending colon polyps x3, hepatic flexure polyps x8, transverse colon polyps x8   . POLYPECTOMY  09/22/2019   Procedure: POLYPECTOMY;  Surgeon: Danie Binder, MD;  [facility];  Service: Endoscopy;;  . REMOVAL RIGHT THIGH CYST  2006  . SAVORY DILATION N/A 03/17/2013   Procedure: SAVORY DILATION;  Surgeon: Danie Binder, MD;  [facility];  Service: Endoscopy;  Laterality: N/A;  #12.8, 14, 15, 16 dilators used  . SAVORY DILATION N/A 06/10/2015   Procedure: SAVORY DILATION;  Surgeon: Danie Binder, MD;  [facility];  Service: Endoscopy;  Laterality: N/A;  . TRANSTHORACIC ECHOCARDIOGRAM  01-01-2014   mild LVH/  ef 33-00%/  grade I diastolic dysfunction/  trivial MR, TR, and PR  . VAGINAL HYSTERECTOMY  1970's    Home Medications:  Medications Prior to Admission  Medication Sig Dispense Refill  . acetaminophen (TYLENOL) 500 MG tablet Take 1,000 mg by mouth every 6 (six) hours as needed for mild pain.    Marland Kitchen amLODipine (NORVASC) 10 MG tablet TAKE 1 TABLET BY MOUTH EVERY DAY (  Patient taking differently: Take 10 mg by mouth daily.) 90 tablet 1  . aspirin EC 81 MG tablet Take 81 mg by mouth daily.     . budesonide-formoterol (SYMBICORT) 80-4.5 MCG/ACT inhaler INHALE 2 PUFFS INTO THE LUNGS TWICE A DAY (Patient taking differently: Inhale 2 puffs into the lungs 2 (two) times daily.) 30.6 each 3  . cholecalciferol (VITAMIN D) 25 MCG (1000 UNIT) tablet Take 2,000 Units by mouth daily.    . diclofenac Sodium (VOLTAREN) 1 % GEL APPLY 4 G TOPICALLY 4 (FOUR) TIMES DAILY. (Patient taking differently: Apply 4 g topically 4 (four) times daily as needed (pain).) 400 g 5  . diphenhydramine-acetaminophen (TYLENOL PM) 25-500  MG TABS tablet Take 2 tablets by mouth at bedtime.    . gabapentin (NEURONTIN) 100 MG capsule TAKE ONE TO TWO CAPSULES ART BEDTIME FOR NERVE PAIN (Patient taking differently: Take 100 mg by mouth at bedtime.) 60 capsule 3  . HYDROcodone-acetaminophen (NORCO/VICODIN) 5-325 MG tablet Take 1 tablet by mouth in the morning and at bedtime.    . magnesium citrate SOLN Take 0.5 Bottles by mouth daily as needed for moderate constipation.    . pantoprazole (PROTONIX) 40 MG tablet TAKE 1 TABLET (40 MG TOTAL) BY MOUTH 2 (TWO) TIMES DAILY. (Patient taking differently: Take 40 mg by mouth 2 (two) times daily with a meal.) 180 tablet 1  . PARoxetine (PAXIL) 40 MG tablet TAKE 1 TABLET BY MOUTH EVERY DAY IN THE MORNING (Patient taking differently: Take 40 mg by mouth daily.) 90 tablet 1  . rosuvastatin (CRESTOR) 20 MG tablet Take 1 tablet (20 mg total) by mouth daily. 90 tablet 3  . TRADJENTA 5 MG TABS tablet TAKE 1 TABLET BY MOUTH EVERY DAY (Patient taking differently: Take 5 mg by mouth daily.) 90 tablet 1  . [START ON 11/16/2020] traMADol (ULTRAM) 50 MG tablet Take one tablet by mouth at bedtime for chronic back pain (Patient taking differently: Take 50 mg by mouth every 6 (six) hours as needed for moderate pain.) 30 tablet 3  . traZODone (DESYREL) 50 MG tablet Take 50 mg by mouth at bedtime.    . betamethasone dipropionate 0.05 % cream Apply topically 2 (two) times daily. Apply twice daily to rash on arms and legs for 10 days (Patient not taking: No sig reported) 45 g 0  . doxycycline (VIBRA-TABS) 100 MG tablet Take 1 tablet (100 mg total) by mouth 2 (two) times daily. (Patient not taking: No sig reported) 10 tablet 0  . glucose blood (ONETOUCH ULTRA) test strip Use as instructed once daily dx e11.9 100 each 5  . methocarbamol (ROBAXIN) 750 MG tablet Take 1 tablet (750 mg total) by mouth 4 (four) times daily. (Patient not taking: No sig reported) 60 tablet 2  . OneTouch Delica Lancets 78M MISC Once daily testing dx  e11.9 100 each 5    Allergies:  Allergies  Allergen Reactions  . Ace Inhibitors Cough    Patient doesn't recall  . Keflex [Cephalexin] Diarrhea and Nausea And Vomiting  . Nitrofurantoin Diarrhea and Nausea And Vomiting  . Penicillins Hives and Other (See Comments)    Reaction:  Blisters on hands/feet  Has patient had a PCN reaction causing immediate rash, facial/tongue/throat swelling, SOB or lightheadedness with hypotension: No Has patient had a PCN reaction causing severe rash involving mucus membranes or skin necrosis: No Has patient had a PCN reaction that required hospitalization No Has patient had a PCN reaction occurring within the last 10 years: No  If all of the above answers are "NO", then may proceed with Cephalosporin use.    Family History  Problem Relation Age of Onset  . Hypertension Mother   . Diabetes Mother   . Heart failure Mother   . Dementia Mother   . Emphysema Father   . Hypertension Father   . Diabetes Brother   . GER disease Brother   . Hypertension Sister   . Hypertension Sister   . Cancer Other        family history   . Diabetes Other        family history   . Heart defect Other        famiily history   . Arthritis Other        family history   . Anesthesia problems Neg Hx   . Hypotension Neg Hx   . Malignant hyperthermia Neg Hx   . Pseudochol deficiency Neg Hx   . Colon cancer Neg Hx     Social History:  reports that she quit smoking about 32 years ago. Her smoking use included cigarettes. She started smoking about 46 years ago. She has a 20.00 pack-year smoking history. She has never used smokeless tobacco. She reports that she does not drink alcohol and does not use drugs.  ROS: A complete review of systems was performed.  All systems are negative except for pertinent findings as noted.  Physical Exam:  Vital signs in last 24 hours: Temp:  [98.2 F (36.8 C)] 98.2 F (36.8 C) (05/10 1100) Pulse Rate:  [85-88] 85 (05/10 1115) Resp:   [14-19] 19 (05/10 1115) BP: (135)/(72) 135/72 (05/10 1100) SpO2:  [91 %] 91 % (05/10 1115) General:  Alert and oriented, No acute distress HEENT: Normocephalic, atraumatic Neck: No JVD or lymphadenopathy Cardiovascular: Regular rate  Lungs: Normal inspiratory/expiratory excursion Abdomen: Soft, nontender, nondistended, no abdominal masses Back: No CVA tenderness Extremities: No edema Neurologic: Grossly intact  Laboratory Data:  Results for orders placed or performed during the hospital encounter of 10/18/20 (from the past 24 hour(s))  Glucose, capillary     Status: None   Collection Time: 10/18/20 10:38 AM  Result Value Ref Range   Glucose-Capillary 97 70 - 99 mg/dL   *Note: Due to a large number of results and/or encounters for the requested time period, some results have not been displayed. A complete set of results can be found in Results Review.   Recent Results (from the past 240 hour(s))  SARS CORONAVIRUS 2 (TAT 6-24 HRS) Nasopharyngeal Nasopharyngeal Swab     Status: None   Collection Time: 10/14/20  7:56 AM   Specimen: Nasopharyngeal Swab  Result Value Ref Range Status   SARS Coronavirus 2 NEGATIVE NEGATIVE Final    Comment: (NOTE) SARS-CoV-2 target nucleic acids are NOT DETECTED.  The SARS-CoV-2 RNA is generally detectable in upper and lower respiratory specimens during the acute phase of infection. Negative results do not preclude SARS-CoV-2 infection, do not rule out co-infections with other pathogens, and should not be used as the sole basis for treatment or other patient management decisions. Negative results must be combined with clinical observations, patient history, and epidemiological information. The expected result is Negative.  Fact Sheet for Patients: SugarRoll.be  Fact Sheet for Healthcare Providers: https://www.woods-mathews.com/  This test is not yet approved or cleared by the [place] FDA and  has  been authorized for detection and/or diagnosis of SARS-CoV-2 by FDA under an Emergency Use Authorization (EUA). This EUA will remain  in  effect (meaning this test can be used) for the duration of the COVID-19 declaration under Se ction 564(b)(1) of the Act, 21 U.S.C. section 360bbb-3(b)(1), unless the authorization is terminated or revoked sooner.  Performed at [facility], [place] [address], [place], [place] [address]    Creatinine: No results for input(s): CREATININE in the last 168 hours.  Radiologic Imaging: No results found.  Impression/Assessment:  Left distal ureteral calculi with hydronephrosis  Plan:  Cystoscopy, left retrograde ureteropyelogram, left ureteroscopy, holmium laser and extraction of left ureteral calculi, placement of left double-J stent.  Lillette Boxer Dovie Kapusta 10/18/2020, 11:39 AM  Lillette Boxer. Santonio Speakman MD

## 2020-10-18 NOTE — Anesthesia Procedure Notes (Addendum)
Procedure Name: LMA Insertion Date/Time: 10/18/2020 12:11 PM Performed by: Ollen Bowl, CRNA Pre-anesthesia Checklist: Patient identified, Patient being monitored, Emergency Drugs available, Timeout performed and Suction available Patient Re-evaluated:Patient Re-evaluated prior to induction Oxygen Delivery Method: Circle System Utilized Preoxygenation: Pre-oxygenation with 100% oxygen Induction Type: IV induction Ventilation: Mask ventilation without difficulty LMA: LMA inserted LMA Size: 4.0 Number of attempts: 1 Placement Confirmation: positive ETCO2 and breath sounds checked- equal and bilateral Comments: Poor seal, and poor ventilation.  Uncomfortable with adequacy and opted to place endotracheal tube.

## 2020-10-18 NOTE — Op Note (Signed)
Preoperative diagnosis: Left distal ureteral calculi with hydronephrosis  Postoperative diagnosis: Same  Principal procedure: Cystoscopy, left retrograde ureteropyelogram, left ureteroscopy, holmium laser lithotripsy and extraction of left ureteral calculi, placement of 6 French by 26 cm contour double-J stent without tether, fluoroscopic interpretation  Surgeon: Milderd Manocchio  Anesthesia: General endotracheal  Complications: None  Specimen: Stone fragments, urine for culture  Drains: Above-mentioned stent  Estimated blood loss: Less than 25 mL  Indications: 75 year old female with history of urolithiasis.  Recent follow-up/KUB revealed the patient have significant stone burden in her left distal ureter, several calculi, and ultrasound revealed significant hydronephrosis.  She presents at this time for definitive ureteroscopic management of these calculi.  I discussed the procedure, risks, complications and expected outcome with the patient.  She understands these, having had ureteroscopic procedures before.  She desires to proceed.  Findings: Bladder appeared normal.  There were no urothelial abnormalities or foreign bodies.  Retrograde study of her left ureter revealed approximately 4 cm of the distalmost ureter to be normal.  There were filling defects consistent with the radiopaque calculi in the ureter just proximal to these last 4 cm.  There was minimal contrast getting by but when contrasted, there was hydroureter.  Directly, there was significant urothelial change around the stones, with some impaction of the stones within the urothelium.  The abnormal urothelium was approximately 3 cm in length with the stones being the cause of this.  Proximally, there was normal ureter other than being dilated.  Description of procedure: The patient was properly identified and marked in the holding area.  She was seen to the operating room where general endotracheal anesthetic was administered.  She was  placed in the dorsolithotomy position, genitalia and perineum were prepped, draped, timeout performed.  She was also administered 400 mg of Cipro IV.  4 French panendoscope was advanced into her bladder with circumferential inspection performed.  Specimen of urine was sent for culture.  Left retrograde ureteropyelogram was then performed using a 6 [place] open-ended catheter and Omnipaque.  The above-mentioned findings were noted.  I eventually negotiated a sensor tip guidewire by these multiple calculi.  The guidewire was advanced proximally in the ureter into what I thought was the upper pole calyx.  Position was confirmed by passing a 5 [place] open-ended catheter over top, into an area consistent with a renal pelvis.  There was a hydronephrotic drip.  The guidewire was replaced and the open-ended catheter removed.  I dilated the distal ureter with first the obturator and then the entire 12/14 ureteral access catheter.  Safety wire was left in.  I then negotiated a 6 French dual-lumen semirigid ureteroscope into the bladder, into the ureter and up to the stones.  41 m fiber was utilized to apply laser energy to these multiple calculi.  Settings were first 50 Hz and 0.3 J, eventually increased to 0.5 J.  Multiple fragments were resulted.  These were grasped with the engage basket and extracted into the bladder.  There was significant urothelial abnormality in the area of the stones, making me think that the stones have been there for quite some time.  The scope was eventually advanced into the more proximal ureter where no further stones were seen.  I took great caution to inspect the treated area carefully several times, and all significant fragments were removed.  The laser did dust the stones significantly, but there were several larger fragments that were resulted in were all extracted.  Following thorough inspection of the treated  area in the proximal ureter, there being no further stones to extract, the  ureteroscope was removed.  Stones were then cystoscopically drained from the bladder and safe for specimen.  Guidewire was backloaded through the scope, a 26 cm x 6 [place] contour double-J stent with tether removed was then placed over the guidewire, and after verification of adequate positioning it was deployed with excellent proximal distal curl seen with fluoroscopy and cystoscopy, respectively.  At this point the bladder was drained and the scope removed.  The patient tolerated procedure well.  She a was awakened and taken to the PACU in stable condition.

## 2020-10-18 NOTE — Anesthesia Procedure Notes (Addendum)
Procedure Name: Intubation Date/Time: 10/18/2020 12:15 PM Performed by: Ollen Bowl, CRNA Pre-anesthesia Checklist: Patient identified, Patient being monitored, Timeout performed, Emergency Drugs available and Suction available Patient Re-evaluated:Patient Re-evaluated prior to induction Oxygen Delivery Method: Circle system utilized Preoxygenation: Pre-oxygenation with 100% oxygen Induction Type: IV induction Ventilation: Mask ventilation without difficulty Laryngoscope Size: Mac and 3 Grade View: Grade I Tube type: Oral Tube size: 7.0 mm Number of attempts: 1 Airway Equipment and Method: Stylet Placement Confirmation: ETT inserted through vocal cords under direct vision,  positive ETCO2 and breath sounds checked- equal and bilateral Secured at: 21 cm Tube secured with: Tape Dental Injury: Teeth and Oropharynx as per pre-operative assessment

## 2020-10-18 NOTE — Telephone Encounter (Signed)
Detailed message left on home phone.

## 2020-10-18 NOTE — Anesthesia Preprocedure Evaluation (Addendum)
Anesthesia Evaluation  Patient identified by MRN, date of birth, ID band Patient awake    Reviewed: Allergy & Precautions, NPO status , Patient's Chart, lab work & pertinent test results  History of Anesthesia Complications Negative for: history of anesthetic complications  Airway Mallampati: II  TM Distance: >3 FB Neck ROM: Full   Comment: Cervical spondylosis Cervical spondylosis with radiculopathy   Dental  (+) Dental Advisory Given, Teeth Intact   Pulmonary sleep apnea and Continuous Positive Airway Pressure Ventilation , former smoker,    Pulmonary exam normal breath sounds clear to auscultation       Cardiovascular Exercise Tolerance: Poor hypertension, Pt. on medications Normal cardiovascular exam Rhythm:Regular Rate:Normal  19-Feb-2020 08:40:52 [facility]-OPS ROUTINE RECORD Normal sinus rhythm Left anterior fascicular block Cannot rule out Inferior infarct , age undetermined Poor R wave progression Abnormal ECG Since last tracing Inferior infarct NOW PRESENT Confirmed by Skeet Latch [phone]) on 02/19/2020 8:54:40 AM   Neuro/Psych  Headaches, PSYCHIATRIC DISORDERS Anxiety Depression TIA Neuromuscular disease (Cervical spondylosis)    GI/Hepatic Neg liver ROS, GERD  Medicated and Controlled,  Endo/Other  diabetes, Well Controlled, Type 2, Oral Hypoglycemic Agents  Renal/GU Renal InsufficiencyRenal disease     Musculoskeletal  (+) Arthritis  (back pain, sciatica), Osteoarthritis,  Cervical spondylosis Cervical spondylosis with radiculopathy     Abdominal   Peds  Hematology  (+) anemia ,   Anesthesia Other Findings   Reproductive/Obstetrics negative OB ROS                                                            Anesthesia Evaluation  Patient identified by MRN, date of birth, ID band Patient awake    Reviewed: Allergy & Precautions, NPO status ,  Patient's Chart, lab work & pertinent test results  Airway Mallampati: II  TM Distance: >3 FB Neck ROM: Full    Dental  (+) Dental Advisory Given   Pulmonary shortness of breath and with exertion, sleep apnea (noncomplaint with CPAP) and Continuous Positive Airway Pressure Ventilation , former smoker,    Pulmonary exam normal breath sounds clear to auscultation       Cardiovascular METS: 3 - Mets hypertension, Pt. on medications Normal cardiovascular exam Rhythm:Regular Rate:Normal     Neuro/Psych  Headaches, PSYCHIATRIC DISORDERS Anxiety Depression TIA Neuromuscular disease (LEG CRAMPS)    GI/Hepatic GERD  Medicated and Controlled,  Endo/Other  diabetes, Well Controlled, Type 2, Oral Hypoglycemic Agents  Renal/GU Renal InsufficiencyRenal disease     Musculoskeletal  (+) Arthritis , Osteoarthritis,    Abdominal   Peds  Hematology  (+) anemia ,   Anesthesia Other Findings   Reproductive/Obstetrics negative OB ROS                             Anesthesia Physical Anesthesia Plan  ASA: III  Anesthesia Plan: General   Post-op Pain Management:    Induction: Intravenous  PONV Risk Score and Plan: 2  Airway Management Planned: Nasal Cannula, Natural Airway and Simple Face Mask  Additional Equipment:   Intra-op Plan:   Post-operative Plan:   Informed Consent: I have reviewed the patients History and Physical, chart, labs and discussed the procedure including the risks, benefits and alternatives for the proposed anesthesia  with the patient or authorized representative who has indicated his/her understanding and acceptance.     Dental advisory given  Plan Discussed with: CRNA and Surgeon  Anesthesia Plan Comments:         Anesthesia Quick Evaluation                                   Anesthesia Evaluation  Patient identified by MRN, date of birth, ID band Patient awake    Reviewed: Allergy & Precautions, NPO  status , Patient's Chart, lab work & pertinent test results  Airway Mallampati: II  TM Distance: >3 FB Neck ROM: Full    Dental  (+) Dental Advisory Given   Pulmonary shortness of breath and with exertion, sleep apnea (noncomplaint with CPAP) and Continuous Positive Airway Pressure Ventilation , former smoker,    Pulmonary exam normal breath sounds clear to auscultation       Cardiovascular METS: 3 - Mets hypertension, Pt. on medications Normal cardiovascular exam Rhythm:Regular Rate:Normal     Neuro/Psych  Headaches, PSYCHIATRIC DISORDERS Anxiety Depression TIA Neuromuscular disease (LEG CRAMPS)    GI/Hepatic GERD  Medicated and Controlled,  Endo/Other  diabetes, Well Controlled, Type 2, Oral Hypoglycemic Agents  Renal/GU Renal InsufficiencyRenal disease     Musculoskeletal  (+) Arthritis , Osteoarthritis,    Abdominal   Peds  Hematology  (+) anemia ,   Anesthesia Other Findings   Reproductive/Obstetrics negative OB ROS                             Anesthesia Physical Anesthesia Plan  ASA: III  Anesthesia Plan: General   Post-op Pain Management:    Induction: Intravenous  PONV Risk Score and Plan: 2  Airway Management Planned: Nasal Cannula, Natural Airway and Simple Face Mask  Additional Equipment:   Intra-op Plan:   Post-operative Plan:   Informed Consent: I have reviewed the patients History and Physical, chart, labs and discussed the procedure including the risks, benefits and alternatives for the proposed anesthesia with the patient or authorized representative who has indicated his/her understanding and acceptance.     Dental advisory given  Plan Discussed with: CRNA and Surgeon  Anesthesia Plan Comments:         Anesthesia Quick Evaluation  Anesthesia Physical  Anesthesia Plan  ASA: III  Anesthesia Plan: General   Post-op Pain Management:    Induction: Intravenous  PONV Risk Score  and Plan: 4 or greater and Ondansetron, Dexamethasone and Midazolam  Airway Management Planned: LMA and Oral ETT  Additional Equipment:   Intra-op Plan:   Post-operative Plan:   Informed Consent: I have reviewed the patients History and Physical, chart, labs and discussed the procedure including the risks, benefits and alternatives for the proposed anesthesia with the patient or authorized representative who has indicated his/her understanding and acceptance.     Dental advisory given  Plan Discussed with: CRNA and Surgeon  Anesthesia Plan Comments:        Anesthesia Quick Evaluation

## 2020-10-18 NOTE — Discharge Instructions (Signed)
1. You may see some blood in the urine and may have some burning with urination for 48-72 hours. You also may notice that you have to urinate more frequently or urgently after your procedure which is normal.  2. You should call should you develop an inability urinate, fever > 101, persistent nausea and vomiting that prevents you from eating or drinking to stay hydrated.  3. If you have a stent, you will likely urinate more frequently and urgently until the stent is removed and you may experience some discomfort/pain in the lower abdomen and flank especially when urinating. You may take pain medication prescribed to you if

## 2020-10-18 NOTE — Progress Notes (Signed)
Spoke with Dr Charna Elizabeth re pt not holding O2 sats with O2- states to give IS and get up in chair. Pt able to get IS to 1000 correctly and coughing up phlegm. Up to bsc- voided 350 pink urine with 1 stone visible then to chair. Pt currently at 94% RA

## 2020-10-18 NOTE — Telephone Encounter (Signed)
-----   Message from Franchot Gallo, MD sent at 10/18/2020  4:39 PM EDT ----- I sent Cipro in for this patient.  Tell her just to take 1 250 mg tablet daily, not two.  She can take it for 5 days.

## 2020-10-18 NOTE — Transfer of Care (Signed)
Immediate Anesthesia Transfer of Care Note  Patient: CHRISTALYNN BOISE  Procedure(s) Performed: CYSTOSCOPY WITH LEFT RETROGRADE PYELOGRAM, LEFT URETEROSCOPY  WITH LASER AND LEFT URETERAL STENT PLACEMENT (Left Ureter) HOLMIUM LASER APPLICATION (Left Ureter)  Patient Location: PACU  Anesthesia Type:General  Level of Consciousness: awake, alert , oriented and patient cooperative  Airway & Oxygen Therapy: Patient Spontanous Breathing and Patient connected to face mask oxygen  Post-op Assessment: Report given to RN, Post -op Vital signs reviewed and stable and Patient moving all extremities X 4  Post vital signs: Reviewed and stable  Last Vitals:  Vitals Value Taken Time  BP    Temp    Pulse 88 10/18/20 1331  Resp 16 10/18/20 1331  SpO2 99 % 10/18/20 1331  Vitals shown include unvalidated device data.  Last Pain: There were no vitals filed for this visit.       Complications: No complications documented.

## 2020-10-19 NOTE — Anesthesia Postprocedure Evaluation (Signed)
Anesthesia Post Note  Patient: Cheryl Abbott  Procedure(s) Performed: CYSTOSCOPY WITH LEFT RETROGRADE PYELOGRAM, LEFT URETEROSCOPY  WITH LASER AND LEFT URETERAL STENT PLACEMENT (Left Ureter) HOLMIUM LASER APPLICATION (Left Ureter)  Patient location during evaluation: PACU Anesthesia Type: General Level of consciousness: awake and alert and oriented Pain management: pain level controlled Respiratory status: spontaneous breathing and respiratory function stable Cardiovascular status: blood pressure returned to baseline and stable Postop Assessment: no apparent nausea or vomiting Anesthetic complications: no   No complications documented.   Last Vitals:  Vitals:   10/18/20 1506 10/18/20 1515  BP:  131/86  Pulse: 91 96  Resp: 20 (!) 23  Temp:  36.6 C  SpO2: 94% 95%    Last Pain:  Vitals:   10/18/20 1515  TempSrc: Oral  PainSc: 3                  [name] C [name]

## 2020-10-20 ENCOUNTER — Encounter (HOSPITAL_COMMUNITY): Payer: Self-pay | Admitting: Urology

## 2020-10-20 LAB — URINE CULTURE: Culture: >=100000 — AB

## 2020-10-24 ENCOUNTER — Ambulatory Visit (HOSPITAL_COMMUNITY)
Admission: RE | Admit: 2020-10-24 | Discharge: 2020-10-24 | Disposition: A | Discharge status: Home / Self Care | Payer: Medicare Other | Source: Ambulatory Visit | Attending: Orthopedic Surgery | Admitting: Orthopedic Surgery

## 2020-10-24 ENCOUNTER — Other Ambulatory Visit: Payer: Self-pay | Admitting: Family Medicine

## 2020-10-24 DIAGNOSIS — G8929 Other chronic pain: Secondary | ICD-10-CM | POA: Insufficient documentation

## 2020-10-24 DIAGNOSIS — M25511 Pain in right shoulder: Secondary | ICD-10-CM | POA: Diagnosis not present

## 2020-10-25 ENCOUNTER — Other Ambulatory Visit: Payer: Self-pay

## 2020-10-25 ENCOUNTER — Inpatient Hospital Stay (HOSPITAL_COMMUNITY): Payer: Medicare Other | Attending: Hematology

## 2020-10-25 DIAGNOSIS — E785 Hyperlipidemia, unspecified: Secondary | ICD-10-CM | POA: Diagnosis not present

## 2020-10-25 DIAGNOSIS — Z79899 Other long term (current) drug therapy: Secondary | ICD-10-CM | POA: Diagnosis not present

## 2020-10-25 DIAGNOSIS — K760 Fatty (change of) liver, not elsewhere classified: Secondary | ICD-10-CM | POA: Diagnosis not present

## 2020-10-25 DIAGNOSIS — D509 Iron deficiency anemia, unspecified: Secondary | ICD-10-CM | POA: Diagnosis not present

## 2020-10-25 DIAGNOSIS — C9000 Multiple myeloma not having achieved remission: Secondary | ICD-10-CM | POA: Insufficient documentation

## 2020-10-25 DIAGNOSIS — I10 Essential (primary) hypertension: Secondary | ICD-10-CM | POA: Insufficient documentation

## 2020-10-25 DIAGNOSIS — K219 Gastro-esophageal reflux disease without esophagitis: Secondary | ICD-10-CM | POA: Diagnosis not present

## 2020-10-25 DIAGNOSIS — E119 Type 2 diabetes mellitus without complications: Secondary | ICD-10-CM | POA: Diagnosis not present

## 2020-10-25 DIAGNOSIS — Z8673 Personal history of transient ischemic attack (TIA), and cerebral infarction without residual deficits: Secondary | ICD-10-CM | POA: Diagnosis not present

## 2020-10-25 DIAGNOSIS — D472 Monoclonal gammopathy: Secondary | ICD-10-CM

## 2020-10-25 LAB — CBC WITH DIFFERENTIAL/PLATELET
Abs Immature Granulocytes: 0.1 10*3/uL — ABNORMAL HIGH (ref 0.00–0.07)
Basophils Absolute: 0.1 10*3/uL (ref 0.0–0.1)
Basophils Relative: 1 %
Eosinophils Absolute: 0.3 10*3/uL (ref 0.0–0.5)
Eosinophils Relative: 3 %
HCT: 40.3 % (ref 36.0–46.0)
Hemoglobin: 13.1 g/dL (ref 12.0–15.0)
Immature Granulocytes: 1 %
Lymphocytes Relative: 18 %
Lymphs Abs: 1.8 10*3/uL (ref 0.7–4.0)
MCH: 33.1 pg (ref 26.0–34.0)
MCHC: 32.5 g/dL (ref 30.0–36.0)
MCV: 101.8 fL — ABNORMAL HIGH (ref 80.0–100.0)
Monocytes Absolute: 1.1 10*3/uL — ABNORMAL HIGH (ref 0.1–1.0)
Monocytes Relative: 11 %
Neutro Abs: 6.6 10*3/uL (ref 1.7–7.7)
Neutrophils Relative %: 66 %
Platelets: 327 10*3/uL (ref 150–400)
RBC: 3.96 MIL/uL (ref 3.87–5.11)
RDW: 16.6 % — ABNORMAL HIGH (ref 11.5–15.5)
WBC: 9.9 10*3/uL (ref 4.0–10.5)
nRBC: 0 % (ref 0.0–0.2)

## 2020-10-25 LAB — COMPREHENSIVE METABOLIC PANEL
ALT: 24 U/L (ref 0–44)
AST: 23 U/L (ref 15–41)
Albumin: 4.2 g/dL (ref 3.5–5.0)
Alkaline Phosphatase: 86 U/L (ref 38–126)
Anion gap: 9 (ref 5–15)
BUN: 22 mg/dL (ref 8–23)
CO2: 24 mmol/L (ref 22–32)
Calcium: 9.9 mg/dL (ref 8.9–10.3)
Chloride: 101 mmol/L (ref 98–111)
Creatinine, Ser: 1.92 mg/dL — ABNORMAL HIGH (ref 0.44–1.00)
GFR, Estimated: 27 mL/min — ABNORMAL LOW (ref >60)
Glucose, Bld: 110 mg/dL — ABNORMAL HIGH (ref 70–99)
Potassium: 4.1 mmol/L (ref 3.5–5.1)
Sodium: 134 mmol/L — ABNORMAL LOW (ref 135–145)
Total Bilirubin: 0.5 mg/dL (ref 0.3–1.2)
Total Protein: 9 g/dL — ABNORMAL HIGH (ref 6.5–8.1)

## 2020-10-26 LAB — KAPPA/LAMBDA LIGHT CHAINS
Kappa free light chain: 51.4 mg/L — ABNORMAL HIGH (ref 3.3–19.4)
Kappa, lambda light chain ratio: 2.97 — ABNORMAL HIGH (ref 0.26–1.65)
Lambda free light chains: 17.3 mg/L (ref 5.7–26.3)

## 2020-10-27 ENCOUNTER — Ambulatory Visit (INDEPENDENT_AMBULATORY_CARE_PROVIDER_SITE_OTHER): Payer: Medicare Other | Admitting: Orthopedic Surgery

## 2020-10-27 ENCOUNTER — Other Ambulatory Visit: Payer: Self-pay | Admitting: Family Medicine

## 2020-10-27 ENCOUNTER — Other Ambulatory Visit: Payer: Self-pay

## 2020-10-27 ENCOUNTER — Encounter: Payer: Self-pay | Admitting: Orthopedic Surgery

## 2020-10-27 VITALS — BP 136/84 | HR 102 | Ht 67.0 in | Wt 193.0 lb

## 2020-10-27 DIAGNOSIS — G8929 Other chronic pain: Secondary | ICD-10-CM

## 2020-10-27 DIAGNOSIS — M75121 Complete rotator cuff tear or rupture of right shoulder, not specified as traumatic: Secondary | ICD-10-CM

## 2020-10-27 LAB — PROTEIN ELECTROPHORESIS, SERUM
A/G Ratio: 0.9 (ref 0.7–1.7)
Albumin ELP: 4 g/dL (ref 2.9–4.4)
Alpha-1-Globulin: 0.2 g/dL (ref 0.0–0.4)
Alpha-2-Globulin: 1.2 g/dL — ABNORMAL HIGH (ref 0.4–1.0)
Beta Globulin: 1.3 g/dL (ref 0.7–1.3)
Gamma Globulin: 2 g/dL — ABNORMAL HIGH (ref 0.4–1.8)
Globulin, Total: 4.7 g/dL — ABNORMAL HIGH (ref 2.2–3.9)
M-Spike, %: 1.5 g/dL — ABNORMAL HIGH
Total Protein ELP: 8.7 g/dL — ABNORMAL HIGH (ref 6.0–8.5)

## 2020-10-27 NOTE — Progress Notes (Signed)
Chief Complaint  Patient presents with  . Shoulder Pain    Right/ review MRI    The MRI shows a partially torn rotator cuff with degenerative arthritis  Clinical exam shows the patient has forward elevation and abduction past 90 degrees with weakness  Based on her medical condition I recommend that she have more therapy although she did go for formal therapy they were not satisfied with her progression so they stopped it  Recommend home exercise program with stretching and strengthening  Come back in 3 months and see if the arm is any stronger

## 2020-10-27 NOTE — Progress Notes (Signed)
Past Medical History:  Diagnosis Date  . Abnormal brain scan 02/07/2012  . Anemia   . Anxiety   . Anxiety disorder   . Arthritis   . Brain TIA 08/19/2015  . Cancer ([place])    acute myeloma  . 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 Chronic back pain   . Depression   . Fatty liver   . GERD (gastroesophageal reflux disease)   . Headache   . HEMORRHOIDS 03/15/2009   OCT 2014 HB 12. 9    . History of adenomatous polyp of colon    2008  . History of kidney stones   . Hypercalcemia   . Hypercalcemia 11/30/2010  . Hyperlipidemia   . Hypertension   . Lactose intolerance in adult 02/01/2016  . Left ureteral stone   . Lipoma of arm 01/19/2015  . Lumbar disc disease with radiculopathy   . Nephrolithiasis   . Neuropathic pain of both feet 04/11/2016  . Neuropathy, peripheral   . Nocturnal hypoxia 02/04/2014   Study confirms this done 02/10/2014   . OSA treated with BiPAP    per study 2007  . RENAL CALCULUS 03/15/2009   Qualifier: Diagnosis of  By: Zeb Comfort    . Sciatica   . Shoulder pain, bilateral   . Sigmoid diverticulosis   . TIA (transient ischemic attack) 02/26/2016  . Type 2 diabetes mellitus ([place])   . Vaginal dryness, menopausal 10/14/2016

## 2020-10-27 NOTE — Patient Instructions (Signed)
Daily exercises

## 2020-11-01 ENCOUNTER — Inpatient Hospital Stay (HOSPITAL_BASED_OUTPATIENT_CLINIC_OR_DEPARTMENT_OTHER): Payer: Medicare Other | Admitting: Physician Assistant

## 2020-11-01 ENCOUNTER — Other Ambulatory Visit: Payer: Self-pay

## 2020-11-01 VITALS — BP 126/74 | HR 92 | Temp 97.5°F | Resp 18 | Wt 196.0 lb

## 2020-11-01 DIAGNOSIS — Z8673 Personal history of transient ischemic attack (TIA), and cerebral infarction without residual deficits: Secondary | ICD-10-CM | POA: Diagnosis not present

## 2020-11-01 DIAGNOSIS — K760 Fatty (change of) liver, not elsewhere classified: Secondary | ICD-10-CM | POA: Diagnosis not present

## 2020-11-01 DIAGNOSIS — D509 Iron deficiency anemia, unspecified: Secondary | ICD-10-CM

## 2020-11-01 DIAGNOSIS — D472 Monoclonal gammopathy: Secondary | ICD-10-CM

## 2020-11-01 DIAGNOSIS — Z79899 Other long term (current) drug therapy: Secondary | ICD-10-CM | POA: Diagnosis not present

## 2020-11-01 DIAGNOSIS — E785 Hyperlipidemia, unspecified: Secondary | ICD-10-CM | POA: Diagnosis not present

## 2020-11-01 DIAGNOSIS — K219 Gastro-esophageal reflux disease without esophagitis: Secondary | ICD-10-CM | POA: Diagnosis not present

## 2020-11-01 DIAGNOSIS — E119 Type 2 diabetes mellitus without complications: Secondary | ICD-10-CM | POA: Diagnosis not present

## 2020-11-01 DIAGNOSIS — C9000 Multiple myeloma not having achieved remission: Secondary | ICD-10-CM

## 2020-11-01 DIAGNOSIS — I10 Essential (primary) hypertension: Secondary | ICD-10-CM | POA: Diagnosis not present

## 2020-11-01 NOTE — Progress Notes (Signed)
Cheryl Abbott,  [id number]   CLINIC:  Medical Oncology/Hematology  PCP:  [name], MD [address] [place] [address] [phone]   REASON FOR VISIT:  Follow-up for IgG kappa smoldering myeloma and iron deficiency anemia  CURRENT THERAPY: Intermittent IV iron infusions  INTERVAL HISTORY:  [name] 75 y.o. female returns for routine follow-up of her IgG kappa smoldering myeloma and iron deficiency anemia.  She was last seen in office by Tarri Abernethy PA-C on 08/30/2020.  She received IV Venofer x5 from 09/09/2020 through 09/22/2020.  At today's visit, she reports feeling fair.  She does complain of ongoing fatigue, reports that she has little to no energy.   She did have a recent urologic procedure with lithotripsy and extraction of obstructive left ureteral calculi that was causing hydronephrosis and AKI on CKD.  She reports that she tolerated the procedure well.  She had some hematuria afterwards, which is expected.  Her hematuria has now resolved.  However, she reports that she developed a urinary tract infection and was placed on antibiotics by Dr. Diona Fanti.  She had not noticed any other recent bleeding such as epistaxis, hematochezia, or melena. She denies recent chest pain on exertion, no new shortness of breath on minimal exertion, pre-syncopal episodes, or palpitations.  She denies any new bone pain or recent fractures. She denies any B symptoms.   No new neurologic symptoms such as tinnitus, new-onset hearing loss, blurred vision, headache, or dizziness.  Denies any numbness or tingling in hands or feet.  She has chronic sciatic of the left leg. No thromboembolic events since her last visit. No new masses or lymphadenopathy per her report.   She has very little appetite. She endorses that she is maintaining a stable weight.    REVIEW OF SYSTEMS:  Review of Systems  Constitutional: Positive for  fatigue. Negative for appetite change, chills, diaphoresis, fever and unexpected weight change.  HENT:   Negative for lump/mass and nosebleeds.   Eyes: Negative for eye problems.  Respiratory: Negative for cough, hemoptysis and shortness of breath.   Cardiovascular: Negative for chest pain, leg swelling and palpitations.  Gastrointestinal: Positive for constipation, nausea and vomiting. Negative for abdominal pain, blood in stool and diarrhea.  Genitourinary: Negative for hematuria.   Skin: Negative.   Neurological: Negative for dizziness, headaches and light-headedness.  Hematological: Does not bruise/bleed easily.  Psychiatric/Behavioral: Positive for sleep disturbance.      PAST MEDICAL/SURGICAL HISTORY:  Past Medical History:  Diagnosis Date  . Abnormal brain scan 02/07/2012  . Anemia   . Anxiety   . Anxiety disorder   . Arthritis   . Brain TIA 08/19/2015  . Cancer ([place])    acute myeloma  . 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 Chronic back pain   . Depression   . Fatty liver   . GERD (gastroesophageal reflux disease)   . Headache   . HEMORRHOIDS 03/15/2009   OCT 2014 HB 12. 9    . History of adenomatous polyp of colon    2008  . History of kidney stones   . Hypercalcemia   . Hypercalcemia 11/30/2010  . Hyperlipidemia   . Hypertension   . Lactose intolerance in adult 02/01/2016  . Left ureteral stone   . Lipoma of arm 01/19/2015  . Lumbar disc disease with radiculopathy   . Nephrolithiasis   . Neuropathic pain of both feet 04/11/2016  . Neuropathy, peripheral   .  Nocturnal hypoxia 02/04/2014   Study confirms this done 02/10/2014   . OSA treated with BiPAP    per study 2007  . RENAL CALCULUS 03/15/2009   Qualifier: Diagnosis of  By: Zeb Comfort    . Sciatica   . Shoulder pain, bilateral   . Sigmoid diverticulosis   . TIA (transient ischemic attack) 02/26/2016  . Type 2 diabetes mellitus ([place])   . Vaginal dryness, menopausal 10/14/2016   Past Surgical  History:  Procedure Laterality Date  . BIOPSY N/A 03/17/2013   Procedure: GASTRIC BIOPSIES;  Surgeon: Danie Binder, MD;  Location: [facility];  Service: Endoscopy;  Laterality: N/A;  . BIOPSY  06/10/2015   Procedure: BIOPSY;  Surgeon: Danie Binder, MD;  [facility];  Service: Endoscopy;;  gastric biopsy  . CARDIAC CATHETERIZATION  05-11-2003  dr Shelva Majestic ([facility])   Abnormal cardiolite/   Normal coronary arteries and normal LVF,  ef  63%  . CARDIOVASCULAR STRESS TEST  01-01-2014   normal lexiscan cardiolite/  no ischemia/ infarct/  normal LV function and wall motion ,  ef 81%  . COLONOSCOPY  last one 10/02/2011   [facility] [place] TICS, IH, NEXT TCS APR 2018  . COLONOSCOPY WITH PROPOFOL N/A 02/26/2017   Procedure: COLONOSCOPY WITH PROPOFOL;  Surgeon: Danie Binder, MD;  [facility];  Service: Endoscopy;  Laterality: N/A;  11:00am  . COLONOSCOPY WITH PROPOFOL N/A 09/22/2019   Procedure: COLONOSCOPY WITH PROPOFOL;  Surgeon: Danie Binder, MD;  [facility];  Service: Endoscopy;  Laterality: N/A;  1:15  . CYSTO/   RIGHT URETEROSOCOPY LASER LITHOTRIPSY STONE EXTRACTION  10-13-2004  . CYSTO/  RIGHT RETROGRADE PYELOGRAM/  PLACMENT RIGHT URETERAL STENT  01-10-2010  . CYSTOSCOPY W/ URETERAL STENT PLACEMENT Right 08/19/2015   Procedure: CYSTOSCOPY WITH RETROGRADE PYELOGRAM/URETERAL STENT PLACEMENT;  Surgeon: Franchot Gallo, MD;  Location: [facility];  Service: Urology;  Laterality: Right;  . CYSTOSCOPY W/ URETERAL STENT PLACEMENT Left 02/23/2020   Procedure: CYSTOSCOPY LEFT  RETROGRADE PYELOGRAM LEFT URETEROSCOPY URETERAL STENT PLACEMENT;  Surgeon: Franchot Gallo, MD;  Location: [facility];  Service: Urology;  Laterality: Left;  . CYSTOSCOPY WITH RETROGRADE PYELOGRAM, URETEROSCOPY AND STENT PLACEMENT Left 10/21/2014   Procedure: CYSTOSCOPY WITH RETROGRADE PYELOGRAM, URETEROSCOPY AND STENT PLACEMENT;  Surgeon: Franchot Gallo, MD;  Location[facility];  Service: Urology;  Laterality: Left;  . CYSTOSCOPY WITH RETROGRADE PYELOGRAM, URETEROSCOPY AND STENT PLACEMENT Right 11/22/2015   Procedure: CYSTOSCOPY, RIGHT URETERAL STENT REMOVAL; RIGHT RETROGRADE PYELOGRAM, RIGHT URETEROSCOPY WITH BALLOON DILATION; RIGHT URETERAL STENT PLACEMENT;  Surgeon: Franchot Gallo, MD;  Location: [facility];  Service: Urology;  Laterality: Right;  . CYSTOSCOPY WITH RETROGRADE PYELOGRAM, URETEROSCOPY AND STENT PLACEMENT Left 10/18/2020   Procedure: CYSTOSCOPY WITH LEFT RETROGRADE PYELOGRAM, LEFT URETEROSCOPY  WITH LASER AND LEFT URETERAL STENT PLACEMENT;  Surgeon: Franchot Gallo, MD;  Location: [facility];  Service: Urology;  Laterality: Left;  . CYSTOSCOPY/RETROGRADE/URETEROSCOPY/STONE EXTRACTION WITH BASKET Bilateral 08/02/2015   Procedure: CYSTOSCOPY; BILATERAL RETROGRADE PYELOGRAMS; BILATERAL URETEROSCOPY, STONE EXTRACTION WITH BASKET;  Surgeon: Franchot Gallo, MD;  Location: [facility];  Service: Urology;  Laterality: Bilateral;  . CYSTOSCOPY/RETROGRADE/URETEROSCOPY/STONE EXTRACTION WITH BASKET Right 06/28/2015   Procedure: CYSTOSCOPY/RETROGRADE/URETEROSCOPY/STONE EXTRACTION WITH BASKET, RIGHT URETERAL DOUBLE J STENT PLACEMENT;  Surgeon: Franchot Gallo, MD;  Location: [facility];  Service: Urology;  Laterality: Right;  . ESOPHAGOGASTRODUODENOSCOPY (EGD) WITH PROPOFOL N/A 03/17/2013   Procedure: ESOPHAGOGASTRODUODENOSCOPY (EGD) WITH PROPOFOL;  Surgeon: Danie Binder, MD;  Location: [facility];  Service: Endoscopy;  Laterality: N/A;  . ESOPHAGOGASTRODUODENOSCOPY (EGD) WITH PROPOFOL N/A 06/10/2015   Distal gastritis, distal esophageal stricture s/p dilation  . ESOPHAGOGASTRODUODENOSCOPY (EGD) WITH PROPOFOL N/A 02/26/2017   Procedure: ESOPHAGOGASTRODUODENOSCOPY (EGD) WITH PROPOFOL;  Surgeon: Danie Binder, MD;  [facility];  Service: Endoscopy;  Laterality: N/A;  . FLEXIBLE SIGMOIDOSCOPY  09/11/2011   [id number] Internal hemorrhoids  . HOLMIUM LASER APPLICATION  Left 9/81/1914   Procedure: HOLMIUM LASER APPLICATION;  Surgeon: Franchot Gallo, MD;  Location[facility];  Service: Urology;  Laterality: Left;  . HOLMIUM LASER APPLICATION Right 7/82/9562   Procedure: HOLMIUM LASER APPLICATION;  Surgeon: Franchot Gallo, MD;  Location: [facility];  Service: Urology;  Laterality: Right;  . HOLMIUM LASER APPLICATION  07/11/8655   Procedure: HOLMIUM LASER APPLICATION;  Surgeon: Franchot Gallo, MD;  Location: [facility];  Service: Urology;;  . HOLMIUM LASER APPLICATION Left 8/46/9629   Procedure: HOLMIUM LASER APPLICATION;  Surgeon: Franchot Gallo, MD;  Location: [facility];  Service: Urology;  Laterality: Left;  Marland Kitchen MASS EXCISION Left 03/04/2015   Procedure: EXCISION OF SOFT TISSUE NEOPLASM LEFT ARM;  Surgeon: Aviva Signs, MD;  Location: [facility];  Service: General;  Laterality: Left;  . PERCUTANEOUS NEPHROSTOLITHOTOMY Bilateral 12/  2011     [facility]  . POLYPECTOMY N/A 03/17/2013   Procedure: GASTRIC POLYPECTOMY;  Surgeon: Danie Binder, MD;  Location: [facility];  Service: Endoscopy;  Laterality: N/A;  . POLYPECTOMY  02/26/2017   Procedure: POLYPECTOMY;  Surgeon: Danie Binder, MD;  [facility];  Service: Endoscopy;;  polyp at cecum, ascending colon polyps x3, hepatic flexure polyps x8, transverse colon polyps x8   . POLYPECTOMY  09/22/2019   Procedure: POLYPECTOMY;  Surgeon: Danie Binder, MD;  [facility];  Service: Endoscopy;;  . REMOVAL RIGHT THIGH CYST  2006  . SAVORY DILATION N/A 03/17/2013   Procedure: SAVORY DILATION;  Surgeon: Danie Binder, MD;  Location: [facility];  Service: Endoscopy;  Laterality: N/A;  #12.8, 14, 15, 16 dilators used  . SAVORY DILATION N/A 06/10/2015   Procedure: SAVORY DILATION;  Surgeon: Danie Binder, MD;  [facility];  Service: Endoscopy;  Laterality: N/A;  . TRANSTHORACIC ECHOCARDIOGRAM  01-01-2014   mild LVH/  ef 52-84%/  grade I diastolic dysfunction/  trivial MR, TR, and PR  .  VAGINAL HYSTERECTOMY  1970's     SOCIAL HISTORY:  Social History   Socioeconomic History  . Marital status: Divorced    Spouse name: Not on file  . Number of children: 2  . Years of education: Not on file  . Highest education level: Not on file  Occupational History  . Occupation: retired   Tobacco Use  . Smoking status: Former Smoker    Packs/day: 1.00    Years: 20.00    Pack years: 20.00    Types: Cigarettes    Start date: 09/21/1974    Quit date: 09/20/1988    Years since quitting: 32.1  . Smokeless tobacco: Never Used  . Tobacco comment: Quit x 20 years  Vaping Use  . Vaping Use: Never used  Substance and Sexual Activity  . Alcohol use: No    Alcohol/week: 0.0 standard drinks  . Drug use: No  . Sexual activity: Never    Birth control/protection: Surgical  Other Topics Concern  . Not on file  Social History Narrative   Patient is right handed   Patient drinks some caffeine daily.  Social Determinants of Health   Financial Resource Strain: Low Risk   . Difficulty of Paying Living Expenses: Not very hard  Food Insecurity: No Food Insecurity  . Worried About Charity fundraiser in the Last Year: Never true  . Ran Out of Food in the Last Year: Never true  Transportation Needs: No Transportation Needs  . Lack of Transportation (Medical): No  . Lack of Transportation (Non-Medical): No  Physical Activity: Inactive  . Days of Exercise per Week: 0 days  . Minutes of Exercise per Session: 0 min  Stress: Stress Concern Present  . Feeling of Stress : To some extent  Social Connections: Moderately Isolated  . Frequency of Communication with Friends and Family: More than three times a week  . Frequency of Social Gatherings with Friends and Family: More than three times a week  . Attends Religious Services: More than 4 times per year  . Active Member of Clubs or Organizations: No  . Attends Archivist Meetings: Never  . Marital Status: Divorced  Arboriculturist Violence: Not At Risk  . Fear of Current or Ex-Partner: No  . Emotionally Abused: No  . Physically Abused: No  . Sexually Abused: No    FAMILY HISTORY:  Family History  Problem Relation Age of Onset  . Hypertension Mother   . Diabetes Mother   . Heart failure Mother   . Dementia Mother   . Emphysema Father   . Hypertension Father   . Diabetes Brother   . GER disease Brother   . Hypertension Sister   . Hypertension Sister   . Cancer Other        family history   . Diabetes Other        family history   . Heart defect Other        famiily history   . Arthritis Other        family history   . Anesthesia problems Neg Hx   . Hypotension Neg Hx   . Malignant hyperthermia Neg Hx   . Pseudochol deficiency Neg Hx   . Colon cancer Neg Hx     CURRENT MEDICATIONS:  Outpatient Encounter Medications as of 11/01/2020  Medication Sig Note  . acetaminophen (TYLENOL) 500 MG tablet Take 1,000 mg by mouth every 6 (six) hours as needed for mild pain.   Marland Kitchen amLODipine (NORVASC) 10 MG tablet TAKE 1 TABLET BY MOUTH EVERY DAY (Patient taking differently: Take 10 mg by mouth daily.)   . aspirin EC 81 MG tablet Take 81 mg by mouth daily.  10/12/2020: Frequently noncompliant  . budesonide-formoterol (SYMBICORT) 80-4.5 MCG/ACT inhaler INHALE 2 PUFFS INTO THE LUNGS TWICE A DAY (Patient taking differently: Inhale 2 puffs into the lungs 2 (two) times daily.)   . cholecalciferol (VITAMIN D) 25 MCG (1000 UNIT) tablet Take 2,000 Units by mouth daily.   . ciprofloxacin (CIPRO) 250 MG tablet TAKE 1 TABLET BY MOUTH TWICE A DAY   . diclofenac Sodium (VOLTAREN) 1 % GEL APPLY 4 G TOPICALLY 4 (FOUR) TIMES DAILY. (Patient taking differently: Apply 4 g topically 4 (four) times daily as needed (pain).)   . diphenhydramine-acetaminophen (TYLENOL PM) 25-500 MG TABS tablet Take 2 tablets by mouth at bedtime.   . gabapentin (NEURONTIN) 100 MG capsule TAKE ONE TO TWO CAPSULES ART BEDTIME FOR NERVE PAIN (Patient  taking differently: Take 100 mg by mouth at bedtime.)   . glucose blood (ONETOUCH ULTRA) test strip Use as instructed once daily dx  e11.9   . HYDROcodone-acetaminophen (NORCO/VICODIN) 5-325 MG tablet Take 1 tablet by mouth in the morning and at bedtime. 10/12/2020: Prescribed q6h prn  . magnesium citrate SOLN Take 0.5 Bottles by mouth daily as needed for moderate constipation.   Glory Rosebush Delica Lancets 76L MISC Once daily testing dx e11.9   . pantoprazole (PROTONIX) 40 MG tablet TAKE 1 TABLET (40 MG TOTAL) BY MOUTH 2 (TWO) TIMES DAILY. (Patient taking differently: Take 40 mg by mouth 2 (two) times daily with a meal.)   . PARoxetine (PAXIL) 40 MG tablet TAKE 1 TABLET BY MOUTH EVERY DAY IN THE MORNING (Patient taking differently: Take 40 mg by mouth daily.)   . rosuvastatin (CRESTOR) 20 MG tablet Take 1 tablet (20 mg total) by mouth daily.   . TRADJENTA 5 MG TABS tablet TAKE 1 TABLET BY MOUTH EVERY DAY   . [START ON 11/16/2020] traMADol (ULTRAM) 50 MG tablet Take one tablet by mouth at bedtime for chronic back pain (Patient taking differently: Take 50 mg by mouth every 6 (six) hours as needed for moderate pain.)   . traZODone (DESYREL) 50 MG tablet Take 50 mg by mouth at bedtime.    No facility-administered encounter medications on file as of 11/01/2020.    ALLERGIES:  Allergies  Allergen Reactions  . Ace Inhibitors Cough    Patient doesn't recall  . Keflex [Cephalexin] Diarrhea and Nausea And Vomiting  . Nitrofurantoin Diarrhea and Nausea And Vomiting  . Penicillins Hives and Other (See Comments)    Reaction:  Blisters on hands/feet  Has patient had a PCN reaction causing immediate rash, facial/tongue/throat swelling, SOB or lightheadedness with hypotension: No Has patient had a PCN reaction causing severe rash involving mucus membranes or skin necrosis: No Has patient had a PCN reaction that required hospitalization No Has patient had a PCN reaction occurring within the last 10 years: No If  all of the above answers are "NO", then may proceed with Cephalosporin use.     PHYSICAL EXAM:  ECOG PERFORMANCE STATUS: 2 - Symptomatic, <50% confined to bed  Vitals:   11/01/20 1319  BP: 126/74  Pulse: 92  Resp: 18  Temp: (!) 97.5 F (36.4 C)  SpO2: 92%   Filed Weights   11/01/20 1319  Weight: 195 lb 15.8 oz (88.9 kg)   Physical Exam Constitutional:      Appearance: Normal appearance. She is obese.  HENT:     Head: Normocephalic and atraumatic.     Mouth/Throat:     Mouth: Mucous membranes are moist.  Eyes:     Extraocular Movements: Extraocular movements intact.     Pupils: Pupils are equal, round, and reactive to light.  Cardiovascular:     Rate and Rhythm: Normal rate and regular rhythm.     Pulses: Normal pulses.     Heart sounds: Normal heart sounds.  Pulmonary:     Effort: Pulmonary effort is normal.     Breath sounds: Normal breath sounds.  Abdominal:     General: Bowel sounds are normal.     Palpations: Abdomen is soft.     Tenderness: There is no abdominal tenderness.  Musculoskeletal:        General: No swelling.     Right lower leg: No edema.     Left lower leg: No edema.  Lymphadenopathy:     Cervical: No cervical adenopathy.  Skin:    General: Skin is warm and dry.  Neurological:     General: No focal deficit  present.     Mental Status: She is alert and oriented to person, place, and time.  Psychiatric:        Mood and Affect: Mood normal.        Behavior: Behavior normal.      LABORATORY DATA:  I have reviewed the labs as listed.  CBC    Component Value Date/Time   WBC 9.9 10/25/2020 1444   RBC 3.96 10/25/2020 1444   HGB 13.1 10/25/2020 1444   HCT 40.3 10/25/2020 1444   PLT 327 10/25/2020 1444   MCV 101.8 (H) 10/25/2020 1444   MCH 33.1 10/25/2020 1444   MCHC 32.5 10/25/2020 1444   RDW 16.6 (H) 10/25/2020 1444   LYMPHSABS 1.8 10/25/2020 1444   MONOABS 1.1 (H) 10/25/2020 1444   EOSABS 0.3 10/25/2020 1444   BASOSABS 0.1  10/25/2020 1444   CMP Latest Ref Rng & Units 10/25/2020 10/05/2020 09/20/2020  Glucose 70 - 99 mg/dL 110(H) 128(H) 141(H)  BUN 8 - 23 mg/dL 22 18 21   Creatinine 0.44 - 1.00 mg/dL 1.92(H) 2.34(H) 2.38(H)  Sodium 135 - 145 mmol/L 134(L) 141 138  Potassium 3.5 - 5.1 mmol/L 4.1 4.8 4.0  Chloride 98 - 111 mmol/L 101 101 104  CO2 22 - 32 mmol/L 24 18(L) 25  Calcium 8.9 - 10.3 mg/dL 9.9 10.3 9.7  Total Protein 6.5 - 8.1 g/dL 9.0(H) 8.4 -  Total Bilirubin 0.3 - 1.2 mg/dL 0.5 <0.2 -  Alkaline Phos 38 - 126 U/L 86 101 -  AST 15 - 41 U/L 23 22 -  ALT 0 - 44 U/L 24 13 -    DIAGNOSTIC IMAGING:  I have independently reviewed the relevant imaging and discussed with the patient.  ASSESSMENT: 1. IgG kappa smoldering myeloma: -Diagnosed in September 2018 after nephrology work-up found M spike in urine (8.3 mg per 24 hours) and serum (1.5 g/dL) -Bone marrow biopsy on 02/19/2017 showed trilineage hematopoiesis, plasma cells 14%, cytogenetics normal, FISH panel normal -Skeletal survey on 02/01/2020 did not show any lytic lesions. Slight interval increase in size of the left kidney stone. -IFE from 08/22/2020 continues to show IgG monoclonal protein with kappa light chain specificity -Per [facility] 20/20/2 guidelines, patient is not high risk SMM, no treatment indicated at this time, only continued close observation -Reviewed labs from 10/25/2020 M spike is 1.5 (up from 1.2 in March 2022).  Light chains appear stable with elevated kappa 51.4, normal lambda 17.3, elevated ratio 2.97  -Underlying CKD with baseline creatinine 1.4-1.9; most recent creatinine on 10/25/2020 shows creatinine 1.92, slightly improved from previous AKI - CMP (10/25/2020) with calcium 9.9; creatinine 1.92; Hgb 13.1 - X-ray of right lower extremity negative for lytic lesion abnormality, obtained March 2022 due to complaints of pain  2.Normocytic anemia with iron deficiency: -She had a colonoscopy on 09/22/2019 which showed polyps in  the transverse colon, ascending colon and in the sigmoid colon. Diverticulosis in the rectosigmoid colon, sigmoid colon and descending colon. External and internal hemorrhoids seen. -Labs reviewed from 08/22/2020 shows serum iron 109, saturation 23%, ferritin 19  -Hemoglobin 13.1 as of 10/25/2020 -Has required intermittent IV iron infusions, last infusion on 09/09/2020 through 09/22/2020; unable to tolerate oral iron due to constipation   PLAN:  1. IgG kappa smoldering myeloma: -Reviewed labs from 10/25/2020 M spike is 1.5 (up from 1.2 in March 2022).  Light chains appear stable with elevated kappa 51.4, normal lambda 17.3, elevated ratio 2.97  - CMP (10/25/2020) with calcium 9.9; creatinine 1.92; Hgb  13.1 - Does not currently exhibit CRAB criteria, but slightly increased M-spike is noted - Will repeat myeloma panel and follow up in 3 months - If M spike continues to increase, may need repeat bone marrow biopsy or initiation of treatment if patient becomes "high risk" smoldering myeloma  2.Normocytic anemia with iron deficiency: -Combination anemia from iron deficiency and CKD. -Hemoglobin 13.1, within normal limits -Received IV iron in April 2022 -Repeat iron panel and RTC in 3 months   PLAN SUMMARY & DISPOSITION: -Repeat iron panel and myeloma panel in 3 months - RTC in 3 months  All questions were answered. The patient knows to call the clinic with any problems, questions or concerns.  Medical decision making: Moderate (2 chronic conditions, review of previous tests, ordering new tests)  Time spent on visit: I spent 20 minutes counseling the patient face to face. The total time spent in the appointment was 30 minutes and more than 50% was on counseling.   Harriett Rush, PA-C  11/01/20 1:29 PM

## 2020-11-01 NOTE — Patient Instructions (Signed)
[place] [facility] Discharge Instructions  You were seen today by Tarri Abernethy PA-C for your smoldering myeloma and anemia. Your labs are mostly stable, but we will continue to watch you closely.  LABS: Return in 3 months for labs   MEDICATIONS: No changes  FOLLOW-UP APPOINTMENT: 3 months   Thank you for choosing [facility] to provide your oncology and hematology care.  To afford each patient quality time with our provider, please arrive at least 15 minutes before your scheduled appointment time.   If you have a lab appointment with the [facility] please come in thru the Main Entrance and check in at the main information desk.  You need to re-schedule your appointment should you arrive 10 or more minutes late.  We strive to give you quality time with our providers, and arriving late affects you and other patients whose appointments are after yours.  Also, if you no show three or more times for appointments you may be dismissed from the clinic at the providers discretion.     Again, thank you for choosing [facility].  Our hope is that these requests will decrease the amount of time that you wait before being seen by our physicians.       _____________________________________________________________  Should you have questions after your visit to [facility], please contact our office at [phone] and follow the prompts.  Our office hours are 8:00 a.m. and 4:30 p.m. Monday - Friday.  Please note that voicemails left after 4:00 p.m. may not be returned until the following business day.  We are closed weekends and major holidays.  You do have access to a nurse 24-7, just call the main number to the clinic [phone] and do not press any options, hold on the line and a nurse will answer the phone.    For prescription refill requests, have your pharmacy contact our office and allow 72 hours.     Due to Covid, you will need to wear a mask upon entering the hospital. If you do not have a mask, a mask will be given to you at the Main Entrance upon arrival. For doctor visits, patients may have 1 support person age 39 or older with them. For treatment visits, patients can not have anyone with them due to social distancing guidelines and our immunocompromised population.

## 2020-11-02 ENCOUNTER — Telehealth: Payer: Self-pay

## 2020-11-02 ENCOUNTER — Other Ambulatory Visit: Payer: Self-pay | Admitting: Family Medicine

## 2020-11-02 MED ORDER — LORAZEPAM 1 MG PO TABS: ORAL_TABLET | ORAL | 0 refills | Status: DC

## 2020-11-02 NOTE — Telephone Encounter (Signed)
I have prescribed 2 ativan tablets which is the standard , she needs to have a driver  from the test, all the best with test!

## 2020-11-02 NOTE — Telephone Encounter (Signed)
Mailbox is full and cannot accept messages. 

## 2020-11-02 NOTE — Telephone Encounter (Signed)
Please advise 

## 2020-11-02 NOTE — Telephone Encounter (Signed)
Patient called asking for 3 valium to make it to her procedure neurologic in [facility] on Tuesday, 5/31 Patient call back # [phone].

## 2020-11-09 ENCOUNTER — Other Ambulatory Visit: Payer: Self-pay | Admitting: Family Medicine

## 2020-11-09 ENCOUNTER — Other Ambulatory Visit: Payer: Self-pay | Admitting: Cardiology

## 2020-11-10 ENCOUNTER — Telehealth: Payer: Self-pay | Admitting: Urology

## 2020-11-10 ENCOUNTER — Telehealth: Payer: Self-pay

## 2020-11-10 NOTE — Telephone Encounter (Signed)
Cipro is antibiotic not sure what pt is needing this for attempted to call pt NA NVM if you can get in touch with her and find out what its needed for we may can work her in today

## 2020-11-10 NOTE — Telephone Encounter (Signed)
Called patient mobile VM not setup/ home # incomplete call.

## 2020-11-10 NOTE — Telephone Encounter (Signed)
Patient called need cipro can Dr Moshe Cipro prescribe or get from [employer].  Pharmacy: [facility]

## 2020-11-10 NOTE — Telephone Encounter (Signed)
Was able to get in touch with patient, she said she will call Dr Franchot Gallo and have provider call this meds in.

## 2020-11-10 NOTE — Telephone Encounter (Signed)
Patient called and feels that she has a UTI but would not tell me what symptoms she is having today and wants to know if you would be willing to call in some [facility] to [facility] on [address]. I did let her know that you were not in the office. She was offered to go to Urgent Care if her symptoms were worse since she would not be seen in the office before next week.  She voices understanding. No other concerns. FYI

## 2020-11-14 DIAGNOSIS — R200 Anesthesia of skin: Secondary | ICD-10-CM | POA: Insufficient documentation

## 2020-11-14 DIAGNOSIS — M5412 Radiculopathy, cervical region: Secondary | ICD-10-CM | POA: Insufficient documentation

## 2020-11-15 ENCOUNTER — Other Ambulatory Visit: Payer: Self-pay

## 2020-11-15 ENCOUNTER — Encounter: Payer: Self-pay | Admitting: Urology

## 2020-11-15 ENCOUNTER — Ambulatory Visit (INDEPENDENT_AMBULATORY_CARE_PROVIDER_SITE_OTHER): Payer: Medicare Other | Admitting: Urology

## 2020-11-15 VITALS — BP 104/66 | HR 93 | Temp 97.6°F | Ht 67.0 in

## 2020-11-15 DIAGNOSIS — N3000 Acute cystitis without hematuria: Secondary | ICD-10-CM

## 2020-11-15 DIAGNOSIS — N200 Calculus of kidney: Secondary | ICD-10-CM | POA: Diagnosis not present

## 2020-11-15 LAB — URINALYSIS, ROUTINE W REFLEX MICROSCOPIC
Bilirubin, UA: NEGATIVE
Glucose, UA: NEGATIVE
Nitrite, UA: NEGATIVE
Specific Gravity, UA: 1.025 (ref 1.005–1.030)
Urobilinogen, Ur: 0.2 mg/dL (ref 0.2–1.0)
pH, UA: 6 (ref 5.0–7.5)

## 2020-11-15 LAB — MICROSCOPIC EXAMINATION
RBC, Urine: >30 /hpf — AB (ref 0–2)
Renal Epithel, UA: NONE SEEN /hpf
WBC, UA: >30 /hpf — AB (ref 0–5)

## 2020-11-15 MED ORDER — CIPROFLOXACIN HCL 250 MG PO TABS: 250.0000 mg | ORAL_TABLET | Freq: Two times a day (BID) | ORAL | 0 refills | Status: DC

## 2020-11-15 NOTE — Progress Notes (Signed)
History of Present Illness: Cheryl Abbott is a 75 y.o. year old female who underwent cystoscopy, left retrograde, left ureteroscopic management of left distal ureteral stone on 10/18/2020.  She had impacted stones and a stent was left in.  This was left without a tether.  She returns today for follow-up.  She has had recent urinary frequency, urgency, low-grade temperature she has an infection  Past Medical History:  Diagnosis Date  . Abnormal brain scan 02/07/2012  . Anemia   . Anxiety   . Anxiety disorder   . Arthritis   . Brain TIA 08/19/2015  . Cancer ([place])    acute myeloma  . 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 Chronic back pain   . Depression   . Fatty liver   . GERD (gastroesophageal reflux disease)   . Headache   . HEMORRHOIDS 03/15/2009   OCT 2014 HB 12. 9    . History of adenomatous polyp of colon    2008  . History of kidney stones   . Hypercalcemia   . Hypercalcemia 11/30/2010  . Hyperlipidemia   . Hypertension   . Lactose intolerance in adult 02/01/2016  . Left ureteral stone   . Lipoma of arm 01/19/2015  . Lumbar disc disease with radiculopathy   . Nephrolithiasis   . Neuropathic pain of both feet 04/11/2016  . Neuropathy, peripheral   . Nocturnal hypoxia 02/04/2014   Study confirms this done 02/10/2014   . OSA treated with BiPAP    per study 2007  . RENAL CALCULUS 03/15/2009   Qualifier: Diagnosis of  By: Zeb Comfort    . Sciatica   . Shoulder pain, bilateral   . Sigmoid diverticulosis   . TIA (transient ischemic attack) 02/26/2016  . Type 2 diabetes mellitus ([place])   . Vaginal dryness, menopausal 10/14/2016    Past Surgical History:  Procedure Laterality Date  . BIOPSY N/A 03/17/2013   Procedure: GASTRIC BIOPSIES;  Surgeon: Danie Binder, MD;  Location: [facility];  Service: Endoscopy;  Laterality: N/A;  . BIOPSY  06/10/2015   Procedure: BIOPSY;  Surgeon: Danie Binder, MD;  [facility];  Service: Endoscopy;;  gastric biopsy  .  CARDIAC CATHETERIZATION  05-11-2003  dr Shelva Majestic ([facility])   Abnormal cardiolite/   Normal coronary arteries and normal LVF,  ef  63%  . CARDIOVASCULAR STRESS TEST  01-01-2014   normal lexiscan cardiolite/  no ischemia/ infarct/  normal LV function and wall motion ,  ef 81%  . COLONOSCOPY  last one 10/02/2011   MOD [place] TICS, IH, NEXT TCS APR 2018  . COLONOSCOPY WITH PROPOFOL N/A 02/26/2017   Procedure: COLONOSCOPY WITH PROPOFOL;  Surgeon: Danie Binder, MD;  [facility];  Service: Endoscopy;  Laterality: N/A;  11:00am  . COLONOSCOPY WITH PROPOFOL N/A 09/22/2019   Procedure: COLONOSCOPY WITH PROPOFOL;  Surgeon: Danie Binder, MD;  [facility];  Service: Endoscopy;  Laterality: N/A;  1:15  . CYSTO/   RIGHT URETEROSOCOPY LASER LITHOTRIPSY STONE EXTRACTION  10-13-2004  . CYSTO/  RIGHT RETROGRADE PYELOGRAM/  PLACMENT RIGHT URETERAL STENT  01-10-2010  . CYSTOSCOPY W/ URETERAL STENT PLACEMENT Right 08/19/2015   Procedure: CYSTOSCOPY WITH RETROGRADE PYELOGRAM/URETERAL STENT PLACEMENT;  Surgeon: Franchot Gallo, MD;  Location: [facility];  Service: Urology;  Laterality: Right;  . CYSTOSCOPY W/ URETERAL STENT PLACEMENT Left 02/23/2020   Procedure: CYSTOSCOPY LEFT  RETROGRADE PYELOGRAM LEFT URETEROSCOPY URETERAL STENT PLACEMENT;  Surgeon: Franchot Gallo, MD;  Location: [facility];  Service: Urology;  Laterality: Left;  . CYSTOSCOPY WITH RETROGRADE PYELOGRAM, URETEROSCOPY AND STENT PLACEMENT Left 10/21/2014   Procedure: CYSTOSCOPY WITH RETROGRADE PYELOGRAM, URETEROSCOPY AND STENT PLACEMENT;  Surgeon: Franchot Gallo, MD;  Location: [facility];  Service: Urology;  Laterality: Left;  . CYSTOSCOPY WITH RETROGRADE PYELOGRAM, URETEROSCOPY AND STENT PLACEMENT Right 11/22/2015   Procedure: CYSTOSCOPY, RIGHT URETERAL STENT REMOVAL; RIGHT RETROGRADE PYELOGRAM, RIGHT URETEROSCOPY WITH BALLOON DILATION; RIGHT URETERAL STENT PLACEMENT;  Surgeon: Franchot Gallo,  MD;  Location: [facility];  Service: Urology;  Laterality: Right;  . CYSTOSCOPY WITH RETROGRADE PYELOGRAM, URETEROSCOPY AND STENT PLACEMENT Left 10/18/2020   Procedure: CYSTOSCOPY WITH LEFT RETROGRADE PYELOGRAM, LEFT URETEROSCOPY  WITH LASER AND LEFT URETERAL STENT PLACEMENT;  Surgeon: Franchot Gallo, MD;  [facility];  Service: Urology;  Laterality: Left;  . CYSTOSCOPY/RETROGRADE/URETEROSCOPY/STONE EXTRACTION WITH BASKET Bilateral 08/02/2015   Procedure: CYSTOSCOPY; BILATERAL RETROGRADE PYELOGRAMS; BILATERAL URETEROSCOPY, STONE EXTRACTION WITH BASKET;  Surgeon: Franchot Gallo, MD;  [facility];  Service: Urology;  Laterality: Bilateral;  . CYSTOSCOPY/RETROGRADE/URETEROSCOPY/STONE EXTRACTION WITH BASKET Right 06/28/2015   Procedure: CYSTOSCOPY/RETROGRADE/URETEROSCOPY/STONE EXTRACTION WITH BASKET, RIGHT URETERAL DOUBLE J STENT PLACEMENT;  Surgeon: Franchot Gallo, MD;  [facility];  Service: Urology;  Laterality: Right;  . ESOPHAGOGASTRODUODENOSCOPY (EGD) WITH PROPOFOL N/A 03/17/2013   Procedure: ESOPHAGOGASTRODUODENOSCOPY (EGD) WITH PROPOFOL;  Surgeon: Danie Binder, MD;  [facility]: [facility];  Service: Endoscopy;  Laterality: N/A;  . ESOPHAGOGASTRODUODENOSCOPY (EGD) WITH PROPOFOL N/A 06/10/2015   Distal gastritis, distal esophageal stricture s/p dilation  . ESOPHAGOGASTRODUODENOSCOPY (EGD) WITH PROPOFOL N/A 02/26/2017   Procedure: ESOPHAGOGASTRODUODENOSCOPY (EGD) WITH PROPOFOL;  Surgeon: Danie Binder, MD;  [facility];  Service: Endoscopy;  Laterality: N/A;  . FLEXIBLE SIGMOIDOSCOPY  09/11/2011   [id number] Internal hemorrhoids  . HOLMIUM LASER APPLICATION Left 5/36/6440   Procedure: HOLMIUM LASER APPLICATION;  Surgeon: Franchot Gallo, MD;  Location[facility];  Service: Urology;  Laterality: Left;  . HOLMIUM LASER APPLICATION Right 3/47/4259   Procedure: HOLMIUM LASER APPLICATION;  Surgeon: Franchot Gallo, MD;  [facility];  Service: Urology;   Laterality: Right;  . HOLMIUM LASER APPLICATION  5/63/8756   Procedure: HOLMIUM LASER APPLICATION;  Surgeon: Franchot Gallo, MD;  [facility]: [facility];  Service: Urology;;  . HOLMIUM LASER APPLICATION Left 4/33/2951   Procedure: HOLMIUM LASER APPLICATION;  Surgeon: Franchot Gallo, MD;  [facility];  Service: Urology;  Laterality: Left;  Marland Kitchen MASS EXCISION Left 03/04/2015   Procedure: EXCISION OF SOFT TISSUE NEOPLASM LEFT ARM;  Surgeon: Aviva Signs, MD;  Location: [facility];  Service: General;  Laterality: Left;  . PERCUTANEOUS NEPHROSTOLITHOTOMY Bilateral 12/  2011     [facility]  . POLYPECTOMY N/A 03/17/2013   Procedure: GASTRIC POLYPECTOMY;  Surgeon: Danie Binder, MD;  [facility];  Service: Endoscopy;  Laterality: N/A;  . POLYPECTOMY  02/26/2017   Procedure: POLYPECTOMY;  Surgeon: Danie Binder, MD;  [facility];  Service: Endoscopy;;  polyp at cecum, ascending colon polyps x3, hepatic flexure polyps x8, transverse colon polyps x8   . POLYPECTOMY  09/22/2019   Procedure: POLYPECTOMY;  Surgeon: Danie Binder, MD;  [facility];  Service: Endoscopy;;  . REMOVAL RIGHT THIGH CYST  2006  . SAVORY DILATION N/A 03/17/2013   Procedure: SAVORY DILATION;  Surgeon: Danie Binder, MD;  Location[facility];  Service: Endoscopy;  Laterality: N/A;  #12.8, 14, 15, 16 dilators used  . SAVORY DILATION N/A 06/10/2015  Procedure: SAVORY DILATION;  Surgeon: Danie Binder, MD;  [facility];  Service: Endoscopy;  Laterality: N/A;  . TRANSTHORACIC ECHOCARDIOGRAM  01-01-2014   mild LVH/  ef 35-70%/  grade I diastolic dysfunction/  trivial MR, TR, and PR  . VAGINAL HYSTERECTOMY  1970's    Home Medications:  (Not in a hospital admission)   Allergies:  Allergies  Allergen Reactions  . Ace Inhibitors Cough    Patient doesn't recall  . Keflex [Cephalexin] Diarrhea and Nausea And Vomiting  . Nitrofurantoin Diarrhea and Nausea And Vomiting  . Penicillins Hives and  Other (See Comments)    Reaction:  Blisters on hands/feet  Has patient had a PCN reaction causing immediate rash, facial/tongue/throat swelling, SOB or lightheadedness with hypotension: No Has patient had a PCN reaction causing severe rash involving mucus membranes or skin necrosis: No Has patient had a PCN reaction that required hospitalization No Has patient had a PCN reaction occurring within the last 10 years: No If all of the above answers are "NO", then may proceed with Cephalosporin use.    Family History  Problem Relation Age of Onset  . Hypertension Mother   . Diabetes Mother   . Heart failure Mother   . Dementia Mother   . Emphysema Father   . Hypertension Father   . Diabetes Brother   . GER disease Brother   . Hypertension Sister   . Hypertension Sister   . Cancer Other        family history   . Diabetes Other        family history   . Heart defect Other        famiily history   . Arthritis Other        family history   . Anesthesia problems Neg Hx   . Hypotension Neg Hx   . Malignant hyperthermia Neg Hx   . Pseudochol deficiency Neg Hx   . Colon cancer Neg Hx     Social History:  reports that she quit smoking about 32 years ago. Her smoking use included cigarettes. She started smoking about 46 years ago. She has a 20.00 pack-year smoking history. She has never used smokeless tobacco. She reports that she does not drink alcohol and does not use drugs.  ROS: A complete review of systems was performed.  All systems are negative except for pertinent findings as noted.  Physical Exam:  Vital signs in last 24 hours: @VSRANGES @ General:  Alert and oriented, No acute distress HEENT: Normocephalic, atraumatic Neck: No JVD or lymphadenopathy Cardiovascular: Regular rate  Lungs: Normal inspiratory/expiratory excursion Extremities: No edema Neurologic: Grossly intact   I have reviewed prior pt notes  I have reviewed urinalysis results  I have independently  reviewed prior imaging  I have reviewed prior urine culture  Impression/Assessment:  1.  Status post recent ureteroscopic stone extraction on the left stones.  She still has a stent.  2.  Probable cystitis  Plan:  1.  Urine was cultured  2.  She was started on Cipro for 1 week  3.  I will see back in 3 weeks for cystoscopy and stent extraction  Lillette Boxer Ramyah Pankowski 11/15/2020, 12:41 PM  Lillette Boxer. Aaron Bostwick MD

## 2020-11-15 NOTE — Progress Notes (Signed)
Urological Symptom Review  Patient is experiencing the following symptoms: Frequent urination Hard to postpone urination Burning/pain with urination  Urine stream starts and stops Blood in urine Weak Stream   Review of Systems  Gastrointestinal (upper)  : Vomiting Indigestion/heartburn  Gastrointestinal (lower) : Constipation  Constitutional : Night Sweats Fatigue  Skin: Itching  Eyes: Negative for eye symptoms  Ear/Nose/Throat : Negative for Ear/Nose/Throat symptoms  Hematologic/Lymphatic: Negative for Hematologic/Lymphatic symptoms  Cardiovascular : Leg swelling  Respiratory : Shortness of breath  Endocrine: Excessive thirst  Musculoskeletal: Back pain  Joint pain  Neurological: Negative for neurological symptoms  Psychologic: Negative for psychiatric symptoms

## 2020-11-17 DIAGNOSIS — R0902 Hypoxemia: Secondary | ICD-10-CM | POA: Diagnosis not present

## 2020-11-18 LAB — URINE CULTURE

## 2020-11-21 NOTE — Progress Notes (Signed)
Letter sent.

## 2020-11-29 ENCOUNTER — Other Ambulatory Visit: Payer: Self-pay

## 2020-11-29 ENCOUNTER — Emergency Department (HOSPITAL_COMMUNITY)
Admission: EM | Admit: 2020-11-29 | Discharge: 2020-11-29 | Discharge status: Against Medical Advice | Payer: Medicare Other | Attending: Emergency Medicine | Admitting: Emergency Medicine

## 2020-11-29 ENCOUNTER — Telehealth: Payer: Self-pay

## 2020-11-29 ENCOUNTER — Encounter (HOSPITAL_COMMUNITY): Payer: Self-pay | Admitting: *Deleted

## 2020-11-29 DIAGNOSIS — Z79899 Other long term (current) drug therapy: Secondary | ICD-10-CM | POA: Diagnosis not present

## 2020-11-29 DIAGNOSIS — R300 Dysuria: Secondary | ICD-10-CM | POA: Diagnosis not present

## 2020-11-29 DIAGNOSIS — Z859 Personal history of malignant neoplasm, unspecified: Secondary | ICD-10-CM | POA: Diagnosis not present

## 2020-11-29 DIAGNOSIS — Z87891 Personal history of nicotine dependence: Secondary | ICD-10-CM | POA: Insufficient documentation

## 2020-11-29 DIAGNOSIS — I129 Hypertensive chronic kidney disease with stage 1 through stage 4 chronic kidney disease, or unspecified chronic kidney disease: Secondary | ICD-10-CM | POA: Diagnosis not present

## 2020-11-29 DIAGNOSIS — Z7982 Long term (current) use of aspirin: Secondary | ICD-10-CM | POA: Diagnosis not present

## 2020-11-29 DIAGNOSIS — N201 Calculus of ureter: Secondary | ICD-10-CM | POA: Insufficient documentation

## 2020-11-29 DIAGNOSIS — N183 Chronic kidney disease, stage 3 unspecified: Secondary | ICD-10-CM | POA: Diagnosis not present

## 2020-11-29 DIAGNOSIS — E1122 Type 2 diabetes mellitus with diabetic chronic kidney disease: Secondary | ICD-10-CM | POA: Insufficient documentation

## 2020-11-29 LAB — URINALYSIS, ROUTINE W REFLEX MICROSCOPIC
Bilirubin Urine: NEGATIVE
Glucose, UA: 50 mg/dL — AB
Ketones, ur: 5 mg/dL — AB
Nitrite: NEGATIVE
Protein, ur: 100 mg/dL — AB
RBC / HPF: >50 RBC/hpf — ABNORMAL HIGH (ref 0–5)
Specific Gravity, Urine: 1.021 (ref 1.005–1.030)
pH: 5 (ref 5.0–8.0)

## 2020-11-29 LAB — CBC WITH DIFFERENTIAL/PLATELET
Abs Immature Granulocytes: 0.07 10*3/uL (ref 0.00–0.07)
Basophils Absolute: 0 10*3/uL (ref 0.0–0.1)
Basophils Relative: 0 %
Eosinophils Absolute: 0.3 10*3/uL (ref 0.0–0.5)
Eosinophils Relative: 3 %
HCT: 43.1 % (ref 36.0–46.0)
Hemoglobin: 13.8 g/dL (ref 12.0–15.0)
Immature Granulocytes: 1 %
Lymphocytes Relative: 18 %
Lymphs Abs: 1.8 10*3/uL (ref 0.7–4.0)
MCH: 33.4 pg (ref 26.0–34.0)
MCHC: 32 g/dL (ref 30.0–36.0)
MCV: 104.4 fL — ABNORMAL HIGH (ref 80.0–100.0)
Monocytes Absolute: 1 10*3/uL (ref 0.1–1.0)
Monocytes Relative: 10 %
Neutro Abs: 6.9 10*3/uL (ref 1.7–7.7)
Neutrophils Relative %: 68 %
Platelets: 297 10*3/uL (ref 150–400)
RBC: 4.13 MIL/uL (ref 3.87–5.11)
RDW: 15.2 % (ref 11.5–15.5)
WBC: 10 10*3/uL (ref 4.0–10.5)
nRBC: 0 % (ref 0.0–0.2)

## 2020-11-29 LAB — BASIC METABOLIC PANEL
Anion gap: 12 (ref 5–15)
BUN: 20 mg/dL (ref 8–23)
CO2: 25 mmol/L (ref 22–32)
Calcium: 10.4 mg/dL — ABNORMAL HIGH (ref 8.9–10.3)
Chloride: 98 mmol/L (ref 98–111)
Creatinine, Ser: 2.13 mg/dL — ABNORMAL HIGH (ref 0.44–1.00)
GFR, Estimated: 24 mL/min — ABNORMAL LOW (ref >60)
Glucose, Bld: 143 mg/dL — ABNORMAL HIGH (ref 70–99)
Potassium: 3.1 mmol/L — ABNORMAL LOW (ref 3.5–5.1)
Sodium: 135 mmol/L (ref 135–145)

## 2020-11-29 MED ORDER — HYDROCODONE-ACETAMINOPHEN 5-325 MG PO TABS
1.0000 | ORAL_TABLET | Freq: Once | ORAL | Status: AC
  Administered 2020-11-29: 1 via ORAL
  Filled 2020-11-29: qty 1

## 2020-11-29 NOTE — Telephone Encounter (Signed)
Returned call. No answer. No way to leave message. If patient returns call- patient may come for urine drop off for lab.

## 2020-11-29 NOTE — ED Provider Notes (Signed)
[facility] EMERGENCY DEPARTMENT Provider Note   CSN: [id number] Arrival date & time: 11/29/20  1415     History Chief Complaint  Patient presents with   Dysuria    Cheryl Abbott is a 75 y.o. female with a history significant for chronic kidney disease stage III and kidney stone disorder under the care of Dr. Diona Fanti presenting for evaluation of persistent suprapubic pressure, urinary frequency and bloody urine.  She also endorses low-grade fevers to 99.9.  She has an active left distal ureteral stone which has been treated with stenting and she reports having 2 rounds of Cipro secondary to UTIs, finished her last dose 3 days ago.  She is concerned about active ongoing infection.  She is supposed to see the urology group next week for removal of the stent.  She has tried contacting them but is having difficulty getting an appointment.  She states that at her last visit Dr. Diona Fanti said he would be out of the country for 3 weeks but wanted her seen by one of his partners in [place].  She denies vomiting but does wake with some vague nausea prior to eating a meal.  She has no other complaints at this time.  She has found no alleviators for symptoms.  The history is provided by the patient.      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 9     History of adenomatous polyp of colon    2008   History of kidney stones    Hypercalcemia    Hypercalcemia 11/30/2010   Hyperlipidemia    Hypertension    Lactose intolerance in adult 02/01/2016   Left ureteral stone    Lipoma of arm 01/19/2015   Lumbar disc disease with radiculopathy    Nephrolithiasis     Neuropathic pain of both feet 04/11/2016   Neuropathy, peripheral    Nocturnal hypoxia 02/04/2014   Study confirms this done 02/10/2014    OSA treated with BiPAP    per study 2007   RENAL CALCULUS 03/15/2009   Qualifier: Diagnosis of  By: Zeb Comfort     Sciatica    Shoulder pain, bilateral    Sigmoid diverticulosis    TIA (transient ischemic attack) 02/26/2016   Type 2 diabetes mellitus (HCC)    Vaginal dryness, menopausal 10/14/2016    Patient Active Problem List   Diagnosis Date Noted   Rash and nonspecific skin eruption 06/25/2020   Iron deficiency anemia 02/10/2020   Flank pain 01/11/2020   Pelvic pressure in female 11/17/2019   Adenomatous polyps    Cervical spondylosis 07/13/2019   Cervical spondylosis with radiculopathy 07/13/2019   Muscle cramps 07/01/2019   Depression, major, single episode, in partial remission (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   Shoulder pain, left 02/28/2018   Palpable mass of neck 02/28/2018   Insomnia 11/09/2017   Multiple myeloma ([place]) 03/15/2017   Urinary frequency 02/28/2017   Monoclonal paraproteinemia 02/15/2017   CKD (chronic kidney disease) stage 3, GFR  30-59 ml/min ([place]) 07/12/2015   Anemia 10/14/2014   Obesity (BMI 30.0-34.9) 01/02/2014   Generalized anxiety disorder 05/20/2013   Right shoulder pain 02/02/2013   Anxiety and depression 08/13/2012   Low back pain with left-sided sciatica 12/22/2010   Abdominal bloating 09/08/2009   Colon adenomas 05/11/2009   GERD 03/15/2009   FATTY LIVER DISEASE 03/15/2009   Sleep apnea 09/15/2008   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   Hyperlipidemia LDL goal <100 09/17/2007   Hypertension goal BP (blood pressure) < 130/80 09/17/2007    Past Surgical History:  Procedure Laterality Date   BIOPSY N/A 03/17/2013   Procedure: GASTRIC BIOPSIES;  Surgeon: Danie Binder, MD;  Location: [facility];  Service: Endoscopy;  Laterality: N/A;    BIOPSY  06/10/2015   Procedure: BIOPSY;  Surgeon: Danie Binder, MD;  [facility];  Service: Endoscopy;;  gastric biopsy   CARDIAC CATHETERIZATION  05-11-2003  dr Shelva Majestic ([facility])   Abnormal cardiolite/   Normal coronary arteries and normal LVF,  ef  63%   CARDIOVASCULAR STRESS TEST  01-01-2014   normal lexiscan cardiolite/  no ischemia/ infarct/  normal LV function and wall motion ,  ef 81%   COLONOSCOPY  last one 10/02/2011   MOD [place] TICS, IH, NEXT TCS APR 2018   COLONOSCOPY WITH PROPOFOL N/A 02/26/2017   Procedure: COLONOSCOPY WITH PROPOFOL;  Surgeon: Danie Binder, MD;  [facility];  Service: Endoscopy;  Laterality: N/A;  11:00am   COLONOSCOPY WITH PROPOFOL N/A 09/22/2019   Procedure: COLONOSCOPY WITH PROPOFOL;  Surgeon: Danie Binder, MD;  [facility];  Service: Endoscopy;  Laterality: N/A;  1:15   CYSTO/   RIGHT URETEROSOCOPY LASER LITHOTRIPSY STONE EXTRACTION  10-13-2004   CYSTO/  RIGHT RETROGRADE PYELOGRAM/  PLACMENT RIGHT URETERAL STENT  01-10-2010   CYSTOSCOPY W/ URETERAL STENT PLACEMENT Right 08/19/2015   Procedure: CYSTOSCOPY WITH RETROGRADE PYELOGRAM/URETERAL STENT PLACEMENT;  Surgeon: Franchot Gallo, MD;  Location: [facility];  Service: Urology;  Laterality: Right;   CYSTOSCOPY W/ URETERAL STENT PLACEMENT Left 02/23/2020   Procedure: CYSTOSCOPY LEFT  RETROGRADE PYELOGRAM LEFT URETEROSCOPY URETERAL STENT PLACEMENT;  Surgeon: Franchot Gallo, MD;  Location: [facility];  Service: Urology;  Laterality: Left;   CYSTOSCOPY WITH RETROGRADE PYELOGRAM, URETEROSCOPY AND STENT PLACEMENT Left 10/21/2014   Procedure: CYSTOSCOPY WITH RETROGRADE PYELOGRAM, URETEROSCOPY AND STENT PLACEMENT;  Surgeon: Franchot Gallo, MD;  Location[facility];  Service: Urology;  Laterality: Left;   CYSTOSCOPY WITH RETROGRADE PYELOGRAM, URETEROSCOPY AND STENT PLACEMENT Right 11/22/2015   Procedure: CYSTOSCOPY, RIGHT URETERAL STENT REMOVAL;  RIGHT RETROGRADE PYELOGRAM, RIGHT URETEROSCOPY WITH BALLOON DILATION; RIGHT URETERAL STENT PLACEMENT;  Surgeon: Franchot Gallo, MD;  Location: [facility];  Service: Urology;  Laterality: Right;   CYSTOSCOPY WITH RETROGRADE PYELOGRAM, URETEROSCOPY AND STENT PLACEMENT Left 10/18/2020   Procedure: CYSTOSCOPY WITH LEFT RETROGRADE PYELOGRAM, LEFT URETEROSCOPY  WITH LASER AND LEFT URETERAL STENT PLACEMENT;  Surgeon: Franchot Gallo, MD;  Location: [facility];  Service: Urology;  Laterality: Left;   CYSTOSCOPY/RETROGRADE/URETEROSCOPY/STONE EXTRACTION WITH BASKET Bilateral 08/02/2015   Procedure: CYSTOSCOPY; BILATERAL RETROGRADE PYELOGRAMS; BILATERAL URETEROSCOPY, STONE EXTRACTION WITH BASKET;  Surgeon: Franchot Gallo, MD;  [facility]: [facility];  Service: Urology;  Laterality: Bilateral;   CYSTOSCOPY/RETROGRADE/URETEROSCOPY/STONE EXTRACTION WITH BASKET Right 06/28/2015   Procedure: CYSTOSCOPY/RETROGRADE/URETEROSCOPY/STONE EXTRACTION WITH BASKET, RIGHT URETERAL DOUBLE J STENT PLACEMENT;  Surgeon: Franchot Gallo, MD;  Location: [facility];  Service: Urology;  Laterality: Right;   ESOPHAGOGASTRODUODENOSCOPY (EGD) WITH PROPOFOL  N/A 03/17/2013   Procedure: ESOPHAGOGASTRODUODENOSCOPY (EGD) WITH PROPOFOL;  Surgeon: Danie Binder, MD;  Location: [facility];  Service: Endoscopy;  Laterality: N/A;   ESOPHAGOGASTRODUODENOSCOPY (EGD) WITH PROPOFOL N/A 06/10/2015   Distal gastritis, distal esophageal stricture s/p dilation   ESOPHAGOGASTRODUODENOSCOPY (EGD) WITH PROPOFOL N/A 02/26/2017   Procedure: ESOPHAGOGASTRODUODENOSCOPY (EGD) WITH PROPOFOL;  Surgeon: Danie Binder, MD;  [facility];  Service: Endoscopy;  Laterality: N/A;   FLEXIBLE SIGMOIDOSCOPY  09/11/2011   AVW:[id number] Internal hemorrhoids   HOLMIUM LASER APPLICATION Left 7/82/9562   Procedure: HOLMIUM LASER APPLICATION;  Surgeon: Franchot Gallo, MD;  Location: [facility];  Service: Urology;  Laterality: Left;   HOLMIUM LASER APPLICATION Right  07/11/8655   Procedure: HOLMIUM LASER APPLICATION;  Surgeon: Franchot Gallo, MD;  Location: [facility];  Service: Urology;  Laterality: Right;   HOLMIUM LASER APPLICATION  8/46/9629   Procedure: HOLMIUM LASER APPLICATION;  Surgeon: Franchot Gallo, MD;  Location: [facility];  Service: Urology;;   HOLMIUM LASER APPLICATION Left 11/06/4130   Procedure: HOLMIUM LASER APPLICATION;  Surgeon: Franchot Gallo, MD;  [facility]: [facility];  Service: Urology;  Laterality: Left;   MASS EXCISION Left 03/04/2015   Procedure: EXCISION OF SOFT TISSUE NEOPLASM LEFT ARM;  Surgeon: Aviva Signs, MD;  Location: [facility];  Service: General;  Laterality: Left;   PERCUTANEOUS NEPHROSTOLITHOTOMY Bilateral 12/  2011     [facility]   POLYPECTOMY N/A 03/17/2013   Procedure: GASTRIC POLYPECTOMY;  Surgeon: Danie Binder, MD;  [facility];  Service: Endoscopy;  Laterality: N/A;   POLYPECTOMY  02/26/2017   Procedure: POLYPECTOMY;  Surgeon: Danie Binder, MD;  [facility];  Service: Endoscopy;;  polyp at cecum, ascending colon polyps x3, hepatic flexure polyps x8, transverse colon polyps x8    POLYPECTOMY  09/22/2019   Procedure: POLYPECTOMY;  Surgeon: Danie Binder, MD;  [facility];  Service: Endoscopy;;   REMOVAL RIGHT THIGH CYST  2006   SAVORY DILATION N/A 03/17/2013   Procedure: SAVORY DILATION;  Surgeon: Danie Binder, MD;  Location[facility];  Service: Endoscopy;  Laterality: N/A;  #12.8, 14, 15, 16 dilators used   SAVORY DILATION N/A 06/10/2015   Procedure: SAVORY DILATION;  Surgeon: Danie Binder, MD;  [facility];  Service: Endoscopy;  Laterality: N/A;   TRANSTHORACIC ECHOCARDIOGRAM  01-01-2014   mild LVH/  ef 44-01%/  grade I diastolic dysfunction/  trivial MR, TR, and PR   VAGINAL HYSTERECTOMY  1970's     OB History     Gravida  2   Para  2   Term  2   Preterm      AB      Living  2      SAB      IAB      Ectopic      Multiple      Live Births               Family History  Problem Relation Age of Onset   Hypertension Mother    Diabetes Mother    Heart failure Mother    Dementia Mother    Emphysema Father    Hypertension Father    Diabetes Brother    GER disease Brother    Hypertension Sister    Hypertension Sister    Cancer Other        family history    Diabetes Other  family history    Heart defect Other        famiily history    Arthritis Other        family history    Anesthesia problems Neg Hx    Hypotension Neg Hx    Malignant hyperthermia Neg Hx    Pseudochol deficiency Neg Hx    Colon cancer Neg Hx     Social History   Tobacco Use   Smoking status: Former    Packs/day: 1.00    Years: 20.00    Pack years: 20.00    Types: Cigarettes    Start date: 09/21/1974    Quit date: 09/20/1988    Years since quitting: 32.2   Smokeless tobacco: Never   Tobacco comments:    Quit x 20 years  Vaping Use   Vaping Use: Never used  Substance Use Topics   Alcohol use: No    Alcohol/week: 0.0 standard drinks   Drug use: No    Home Medications Prior to Admission medications   Medication Sig Start Date End Date Taking? Authorizing Provider  acetaminophen (TYLENOL) 500 MG tablet Take 1,000 mg by mouth every 6 (six) hours as needed for mild pain.    [provider]  amLODipine (NORVASC) 10 MG tablet TAKE 1 TABLET BY MOUTH EVERY DAY 11/09/20   [name], MD  aspirin EC 81 MG tablet Take 81 mg by mouth daily.     [provider]  budesonide-formoterol (SYMBICORT) 80-4.5 MCG/ACT inhaler INHALE 2 PUFFS INTO THE LUNGS TWICE A DAY Patient taking differently: Inhale 2 puffs into the lungs 2 (two) times daily. 06/16/20   [name], MD  cholecalciferol (VITAMIN D) 25 MCG (1000 UNIT) tablet Take 2,000 Units by mouth daily.    [provider]  ciprofloxacin (CIPRO) 250 MG tablet Take 1 tablet (250 mg total) by mouth 2 (two) times daily. 11/15/20   Franchot Gallo, MD  diclofenac  Sodium (VOLTAREN) 1 % GEL APPLY 4 G TOPICALLY 4 (FOUR) TIMES DAILY. Patient taking differently: Apply 4 g topically 4 (four) times daily as needed (pain). 07/15/20   Carole Civil, MD  diphenhydramine-acetaminophen (TYLENOL PM) 25-500 MG TABS tablet Take 2 tablets by mouth at bedtime.    [provider]  gabapentin (NEURONTIN) 100 MG capsule TAKE ONE TO TWO CAPSULES ART BEDTIME FOR NERVE PAIN Patient taking differently: Take 100 mg by mouth at bedtime. 08/01/20   [name], MD  glucose blood [facility] ULTRA) test strip Use as instructed once daily dx e11.9 05/09/20   [name], MD  HYDROcodone-acetaminophen (NORCO/VICODIN) 5-325 MG tablet Take 1 tablet by mouth in the morning and at bedtime. 09/26/20   [provider]  LORazepam (ATIVAN) 1 MG tablet Take one tablet half an hour before test, you may repeat after 15 minutes once only if still very anxious 11/02/20   [name], MD  magnesium citrate SOLN Take 0.5 Bottles by mouth daily as needed for moderate constipation.    [provider]  OneTouch Delica Lancets 40N MISC Once daily testing dx e11.9 01/07/20   [name], MD  pantoprazole (PROTONIX) 40 MG tablet TAKE 1 TABLET (40 MG TOTAL) BY MOUTH 2 (TWO) TIMES DAILY. 11/09/20   [name], MD  PARoxetine (PAXIL) 40 MG tablet TAKE 1 TABLET BY MOUTH EVERY DAY IN THE MORNING 11/09/20   [name], MD  rosuvastatin (CRESTOR) 20 MG tablet TAKE 1 TABLET BY MOUTH EVERY DAY 11/09/20  [name], MD  TRADJENTA 5 MG TABS tablet TAKE 1 TABLET BY MOUTH EVERY DAY 10/24/20   [name], MD  traMADol (ULTRAM) 50 MG tablet Take one tablet by mouth at bedtime for chronic back pain Patient taking differently: Take 50 mg by mouth every 6 (six) hours as needed for moderate pain. 11/16/20   [name], MD  traZODone (DESYREL) 50 MG tablet Take 50 mg by mouth at bedtime.    [provider]    Allergies     Ace inhibitors, Keflex [cephalexin], Nitrofurantoin, and Penicillins  Review of Systems   Review of Systems  Constitutional:  Positive for fever.  HENT:  Negative for congestion and sore throat.   Eyes: Negative.   Respiratory:  Negative for chest tightness and shortness of breath.   Cardiovascular:  Negative for chest pain.  Gastrointestinal:  Positive for abdominal pain and nausea. Negative for vomiting.  Genitourinary:  Positive for dysuria and hematuria. Negative for flank pain.  Musculoskeletal:  Negative for arthralgias, joint swelling and neck pain.  Skin: Negative.  Negative for rash and wound.  Neurological:  Negative for dizziness, weakness, light-headedness, numbness and headaches.  Psychiatric/Behavioral: Negative.     Physical Exam Updated Vital Signs BP (!) 129/96   Pulse 92   Temp 98.4 F (36.9 C) (Oral)   Resp 18   Ht _0  (1.702 m)   Wt 88.5 kg   SpO2 99%   BMI 30.54 kg/m   Physical Exam Vitals and nursing note reviewed.  Constitutional:      Appearance: She is well-developed.  HENT:     Head: Normocephalic and atraumatic.  Eyes:     Conjunctiva/sclera: Conjunctivae normal.  Cardiovascular:     Rate and Rhythm: Normal rate and regular rhythm.     Heart sounds: Normal heart sounds.  Pulmonary:     Effort: Pulmonary effort is normal.     Breath sounds: Normal breath sounds. No wheezing.  Abdominal:     General: Bowel sounds are normal.     Palpations: Abdomen is soft.     Tenderness: There is abdominal tenderness in the suprapubic area. There is no guarding.     Comments: Mild suprapubic tenderness.  No CVA tenderness  Musculoskeletal:        General: Normal range of motion.     Cervical back: Normal range of motion.  Skin:    General: Skin is warm and dry.  Neurological:     Mental Status: She is alert.    ED Results / Procedures / Treatments   Labs (all labs ordered are listed, but only abnormal results are displayed) Labs Reviewed   URINALYSIS, ROUTINE W REFLEX MICROSCOPIC - Abnormal; Notable for the following components:      Result Value   Color, Urine AMBER (*)    APPearance CLOUDY (*)    Glucose, UA 50 (*)    Hgb urine dipstick LARGE (*)    Ketones, ur 5 (*)    Protein, ur 100 (*)    Leukocytes,Ua LARGE (*)    RBC / HPF >50 (*)    Bacteria, UA RARE (*)    All other components within normal limits  CBC WITH DIFFERENTIAL/PLATELET - Abnormal; Notable for the following components:   MCV 104.4 (*)    All other components within normal limits  BASIC METABOLIC PANEL - Abnormal; Notable for the following components:   Potassium 3.1 (*)    Glucose, Bld 143 (*)  Creatinine, Ser 2.13 (*)    Calcium 10.4 (*)    GFR, Estimated 24 (*)    All other components within normal limits  URINE CULTURE    EKG None  Radiology No results found.  Procedures Procedures   Medications Ordered in ED Medications  HYDROcodone-acetaminophen (NORCO/VICODIN) 5-325 MG per tablet 1 tablet (1 tablet Oral Given 11/29/20 1617)    ED Course  I have reviewed the triage vital signs and the nursing notes.  Pertinent labs & imaging results that were available during my care of the patient were reviewed by me and considered in my medical decision making (see chart for details).    MDM Rules/Calculators/A&P                          Labs reviewed and discussed with pt, creatinine reassuring, elevated but stable in comparison to prior evals.  She does have moderate wbc's and rbc's in urine, rare bacteria.  Urine culture ordered.  Review of chart - pt had a urine cx early June - positive for Strep viridans.  Suspectible to pcn, levo, vanc, clinda. PCN allergic.  Not clearly needing additional abx today, afebrile,  rare bacterial.    She expressed concern about getting in with [facility] per above, with Dr. Diona Fanti being out of town - has appt set for July 5 per chart.  Call placed to urology for further advice regarding need for  abx and f/u care.  As the urologist was calling in, I was advised by RN that pt had eloped.    Message sent to Dr Diona Fanti informing of this visit and lab findings.  Final Clinical Impression(s) / ED Diagnoses Final diagnoses:  Dysuria  Ureteral stone    Rx / DC Orders ED Discharge Orders     None        Landis Martins 11/29/20 1853    Elnora Morrison, MD 11/30/20 [phone]

## 2020-11-29 NOTE — ED Triage Notes (Signed)
Pt with burning with urination x 2 weeks.  Hx of kidney stones recently with stent placement.  Low grade fever at home and nausea per pt.

## 2020-11-29 NOTE — Telephone Encounter (Signed)
Has UTI symptoms.  Wanting to be seen.   Thanks, Helene Kelp

## 2020-11-29 NOTE — ED Notes (Signed)
Not in room

## 2020-12-01 ENCOUNTER — Other Ambulatory Visit: Payer: Self-pay | Admitting: Urology

## 2020-12-01 ENCOUNTER — Other Ambulatory Visit: Payer: Self-pay | Admitting: Orthopedic Surgery

## 2020-12-01 ENCOUNTER — Other Ambulatory Visit: Payer: Self-pay | Admitting: Cardiology

## 2020-12-01 ENCOUNTER — Other Ambulatory Visit: Payer: Self-pay | Admitting: Family Medicine

## 2020-12-01 DIAGNOSIS — N3000 Acute cystitis without hematuria: Secondary | ICD-10-CM

## 2020-12-02 LAB — URINE CULTURE: Culture: >=100000 — AB

## 2020-12-02 NOTE — Telephone Encounter (Signed)
Appointment scheduled to see MD on 06/28 with patient.

## 2020-12-03 ENCOUNTER — Telehealth: Payer: Self-pay | Admitting: Emergency Medicine

## 2020-12-03 NOTE — Progress Notes (Signed)
ED Antimicrobial Stewardship Positive Culture Follow Up   Cheryl Abbott is an 75 y.o. female who presented to [facility] on 11/29/2020 with a chief complaint of  Chief Complaint  Patient presents with   Dysuria    Recent Results (from the past 720 hour(s))  Microscopic Examination     Status: Abnormal   Collection Time: 11/15/20  2:30 PM   Urine  Result Value Ref Range Status   WBC, UA >30 (A) 0 - 5 /hpf Final   RBC >30 (A) 0 - 2 /hpf Final   Epithelial Cells (non renal) 0-10 0 - 10 /hpf Final   Renal Epithel, UA None seen None seen /hpf Final   Mucus, UA Present Not Estab. Final   Bacteria, UA Many (A) None seen/Few Final  Urine Culture     Status: None   Collection Time: 11/15/20  3:40 PM   Specimen: Urine, Clean Catch   UR  Result Value Ref Range Status   Urine Culture, Routine Final report  Final   Organism ID, Bacteria Comment  Final    Comment: Mixed urogenital flora 25,000-50,000 colony forming units per mL   Urine culture     Status: Abnormal   Collection Time: 11/29/20  3:00 PM   Specimen: Urine, Clean Catch  Result Value Ref Range Status   Specimen Description   Final    URINE, CLEAN CATCH Performed at [facility], [address], [place], [place] [address]    Special Requests   Final    NONE Performed at [facility], [address] [place], [place] [address]    Culture (A)  Final    >=100,000 COLONIES/mL VIRIDANS STREPTOCOCCUS Standardized susceptibility testing for this organism is not available. Performed at [facility], [place] [address], [place], [place] [address]    Report Status 12/02/2020 FINAL  Final    Patient left before being seen by a provider. Patient placement to call patient. If symptomatic, instruct patient to return to the ED. If no symptoms, patient to f/u with urology as scheduled   ED Provider: Charlesetta Shanks, MD    Albertina Parr, PharmD., BCPS, BCCCP Clinical Pharmacist Please refer to [facility] for unit-specific  pharmacist

## 2020-12-03 NOTE — Telephone Encounter (Signed)
Post ED Visit - Positive Culture Follow-up: Successful Patient Follow-Up  Culture assessed and recommendations reviewed by:  []  Elenor Quinones, Pharm.D. []  Heide Guile, Pharm.D., BCPS AQ-ID []  Parks Neptune, Pharm.D., BCPS []  Alycia Rossetti, Pharm.D., BCPS []  [place], [place].D., BCPS, AAHIVP []  Legrand Como, Pharm.D., BCPS, AAHIVP []  Salome Arnt, PharmD, BCPS []  Johnnette Gourd, PharmD, BCPS []  Hughes Better, PharmD, BCPS []  Leeroy Cha, PharmD  Positive urine culture  []  Patient discharged without antimicrobial prescription and treatment is now indicated []  Organism is resistant to prescribed ED discharge antimicrobial []  Patient with positive blood cultures  Changes discussed with ED provider: Dr Johnney Killian New antibiotic prescription none,call for symptom check, if + symptoms, return to ED , otherwise followup with urology as scheduled   Attempting to contact patient    Hazle Nordmann 12/03/2020, 12:46 PM

## 2020-12-06 ENCOUNTER — Other Ambulatory Visit: Payer: Self-pay

## 2020-12-06 ENCOUNTER — Other Ambulatory Visit: Payer: Medicare Other

## 2020-12-06 DIAGNOSIS — N3000 Acute cystitis without hematuria: Secondary | ICD-10-CM

## 2020-12-06 LAB — URINALYSIS, ROUTINE W REFLEX MICROSCOPIC
Bilirubin, UA: NEGATIVE
Glucose, UA: NEGATIVE
Nitrite, UA: NEGATIVE
Specific Gravity, UA: 1.025 (ref 1.005–1.030)
Urobilinogen, Ur: 1 mg/dL (ref 0.2–1.0)
pH, UA: 6 (ref 5.0–7.5)

## 2020-12-07 ENCOUNTER — Telehealth: Payer: Self-pay

## 2020-12-07 NOTE — Telephone Encounter (Signed)
Patient called and made aware.

## 2020-12-07 NOTE — Telephone Encounter (Signed)
-----   Message from Franchot Gallo, MD sent at 12/06/2020  7:33 PM EDT ----- Let pt know that urine didn't look infected. I dont think abx needed now but will wait for culture results ----- Message ----- From: Iris Pert, LPN Sent: 10/16/2255   5:32 PM EDT To: Franchot Gallo, MD  Urine drop off Culture to follow

## 2020-12-07 NOTE — Telephone Encounter (Signed)
Called patient today- no answer. Mailbox full- unable to leave message.

## 2020-12-07 NOTE — Telephone Encounter (Signed)
-----   Message from Franchot Gallo, MD sent at 12/06/2020  7:33 PM EDT ----- Let pt know that urine didn't look infected. I dont think abx needed now but will wait for culture results ----- Message ----- From: Iris Pert, LPN Sent: 07/19/1386   5:32 PM EDT To: Franchot Gallo, MD  Urine drop off Culture to follow

## 2020-12-09 LAB — URINE CULTURE

## 2020-12-09 IMAGING — RF DG ESOPHAGUS
9 series · 15 of 24 positions shown · non-contrast
Comparison: None

CLINICAL DATA: Dysphagia, particularly difficulty swallowing pills

EXAM:
ESOPHOGRAM / BARIUM SWALLOW / BARIUM TABLET STUDY
TECHNIQUE: Combined double contrast and single contrast examination performed
using effervescent crystals, thick barium liquid, and thin barium
liquid. The patient was observed with fluoroscopy swallowing a 13 mm
barium sulphate tablet.
FLUOROSCOPY TIME:  Fluoroscopy Time:  2 minutes 6 seconds
Radiation Exposure Index (if provided by the fluoroscopic device):
56.3 mGy
Number of Acquired Spot Images: multiple fluoroscopic screen
captures

[Series 1: cp_standard · 0.17mm/px · 2 of 107 frames shown (1 of 9)]
[frame 1/107]
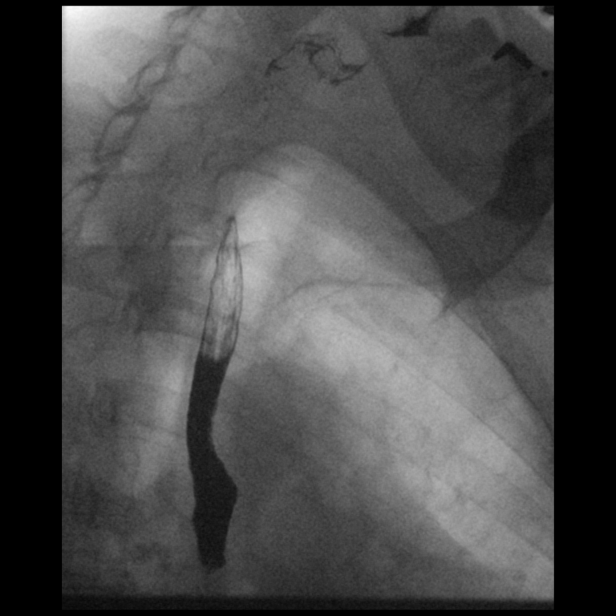
[frame 91/107]
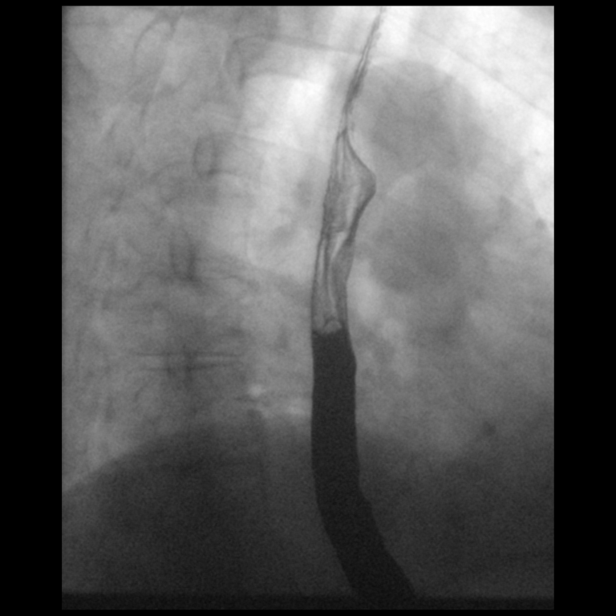

[Series 2: cp_standard · 0.18mm/px · 1 of 185 frames shown (2 of 9)]
[frame 158/185]
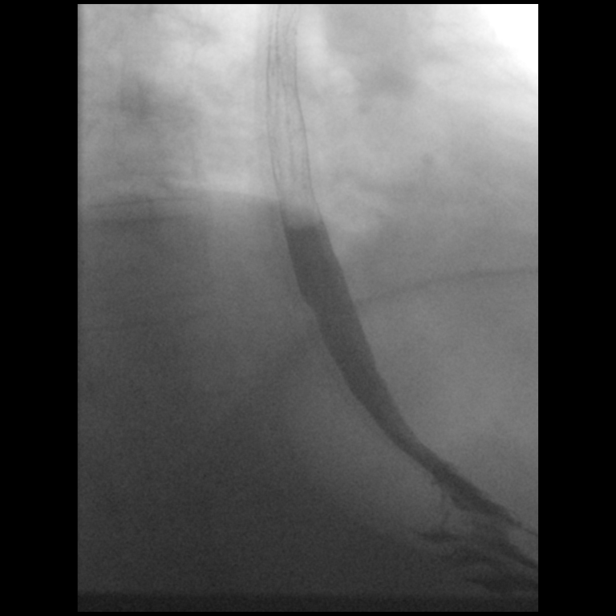

[Series 3: cp_standard · 0.17mm/px · 2 of 157 frames shown (3 of 9)]
[frame 24/157]
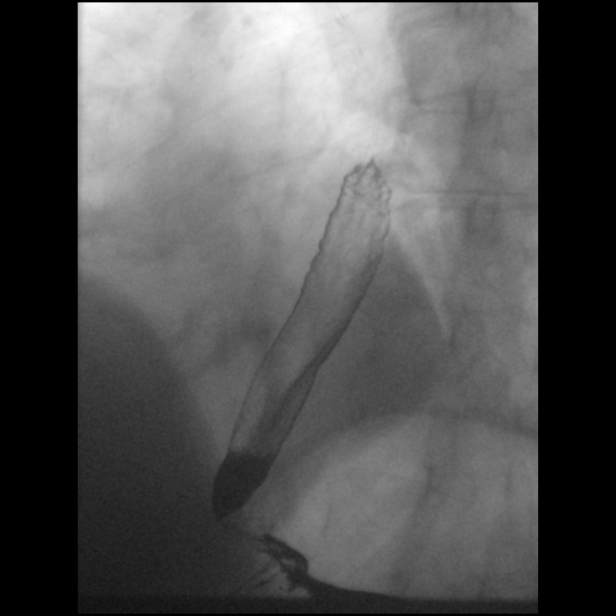
[frame 154/157  full-range]
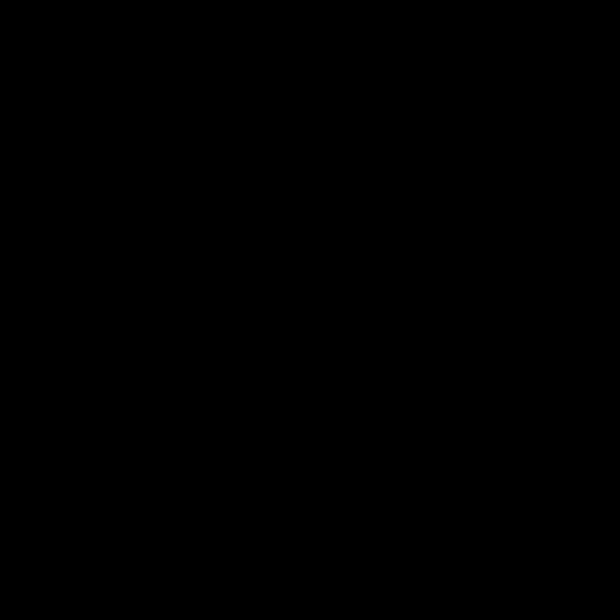

[Series 4: cp_standard · 0.26mm/px · 2 of 133 frames shown (4 of 9)]
[frame 20/133]
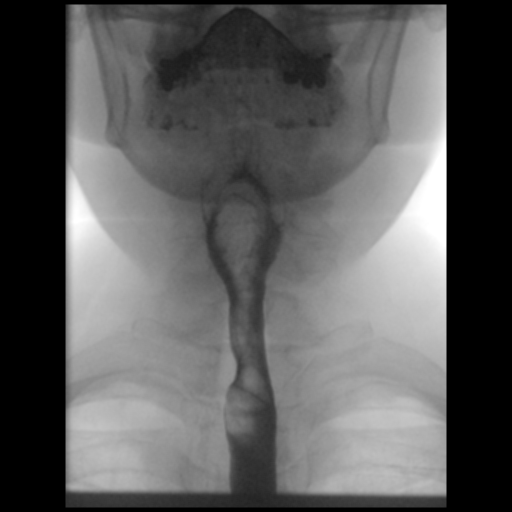
[frame 128/133  full-range]
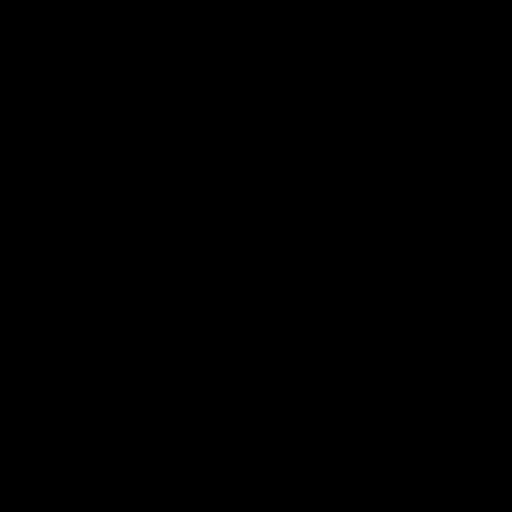

[Series 5: cp_standard · 0.25mm/px · 1 of 107 frames shown (5 of 9)]
[frame 91/107]
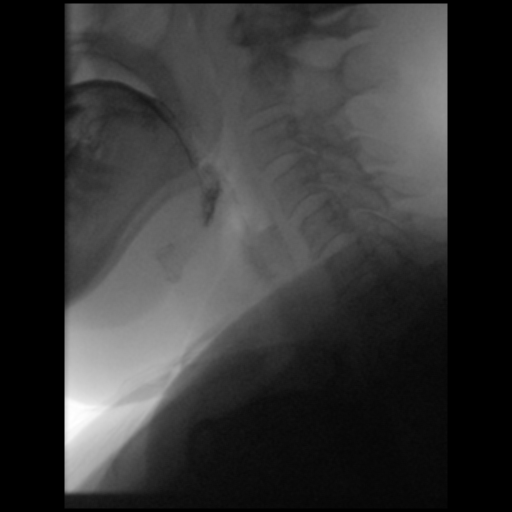

[Series 6: cp_standard · 0.18mm/px · 2 of 33 frames shown (6 of 9)]
[frame 5/33]
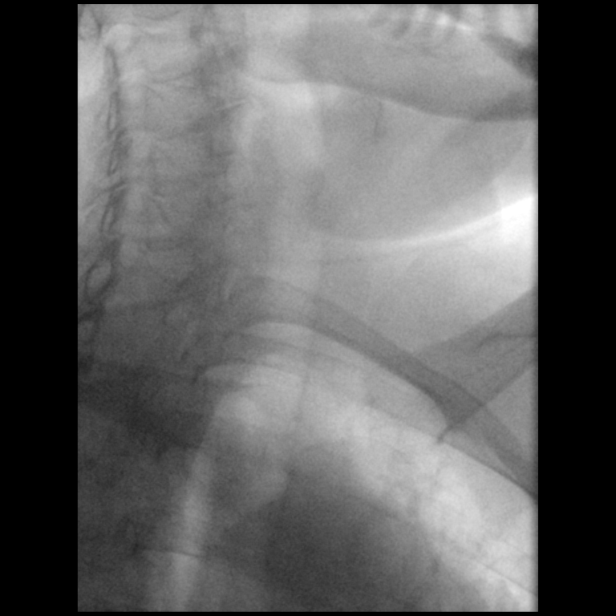
[frame 29/33]
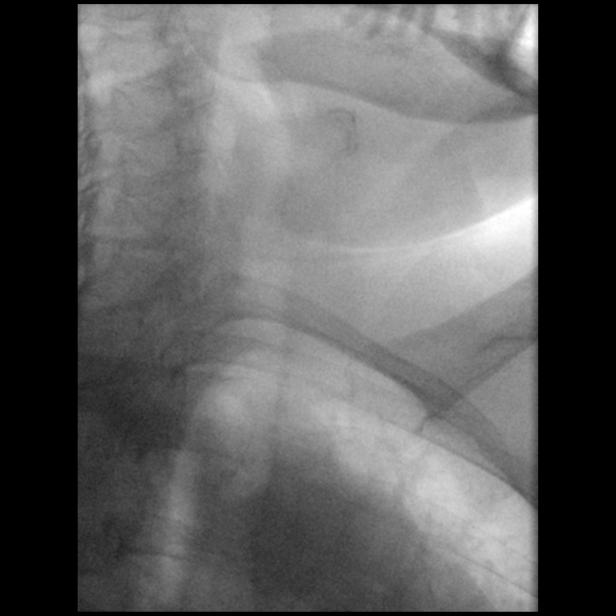

[Series 7: cp_standard · 0.18mm/px · 2 of 116 frames shown (7 of 9)]
[frame 18/116]
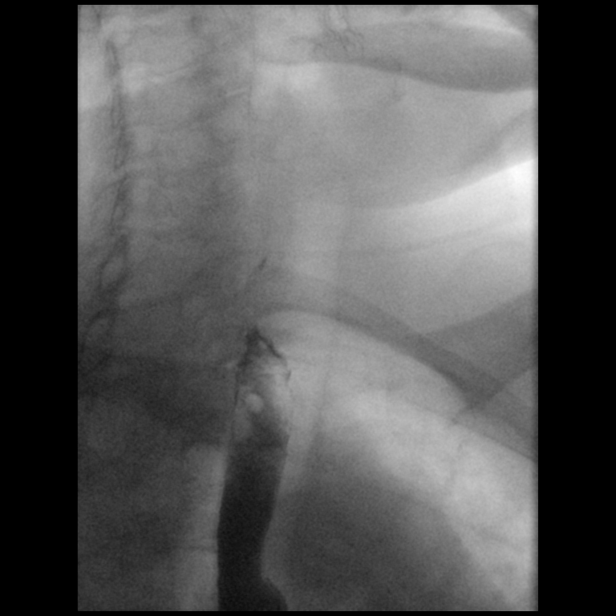
[frame 99/116]
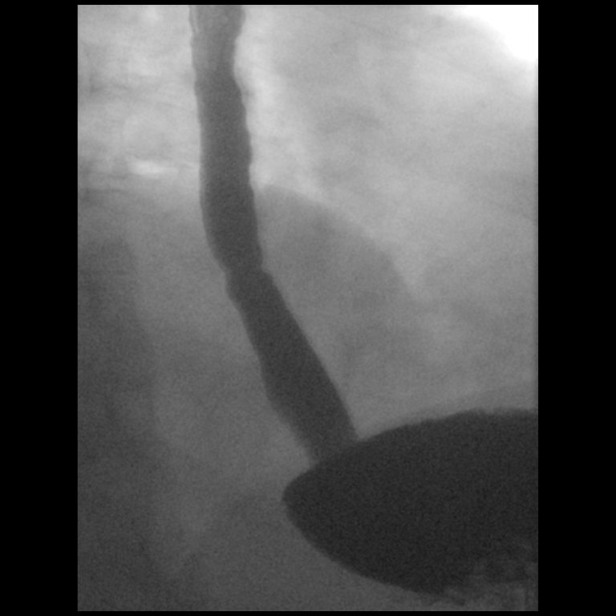

[Series 8: cp_standard · 0.18mm/px · 1 of 161 frames shown (8 of 9)]
[frame 137/161]
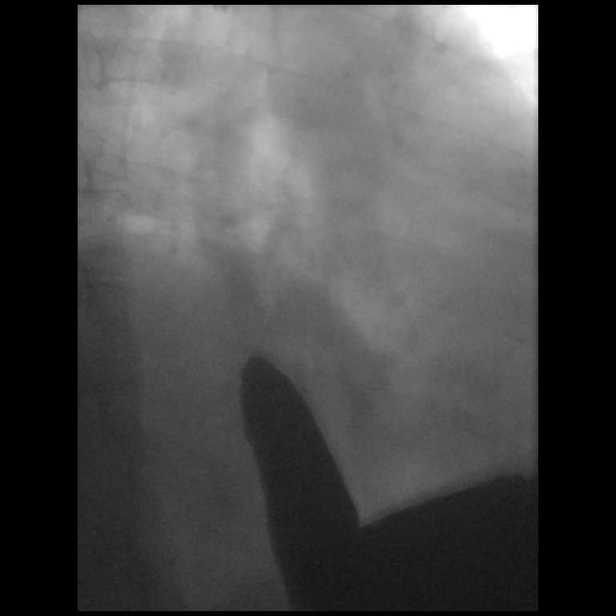

[Series 9: cp_standard · 0.18mm/px · 2 of 173 frames shown (9 of 9)]
[frame 26/173]
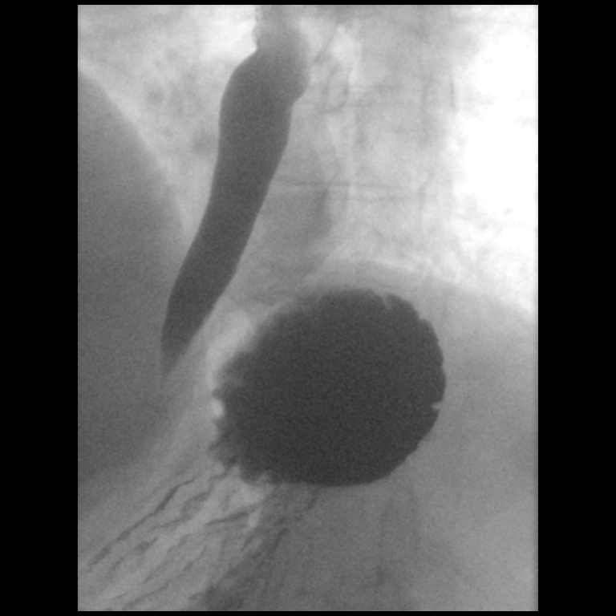
[frame 172/173  full-range]
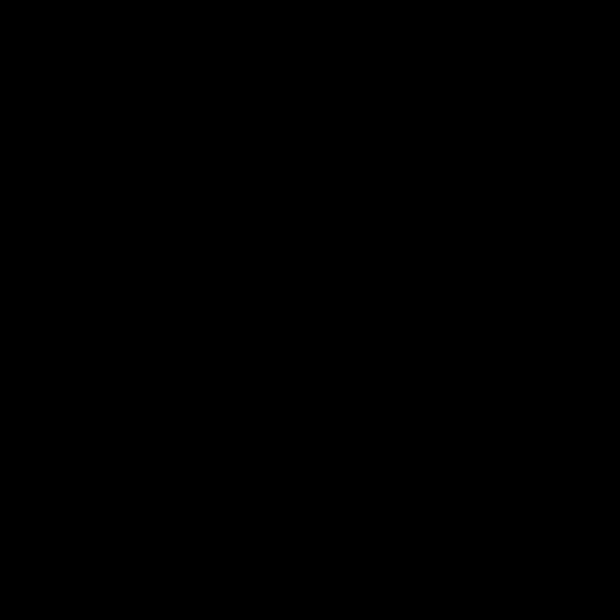

[15 of 24 positions shown; findings below may reference images not displayed]

FINDINGS: Esophageal distention: Normal without mass or stricture

Filling defects:  None

12.5 mm barium tablet: Transient delay in the hypopharynx. Easily
passed to the stomach without obstruction

Motility:  Diffuse age-related esophageal dysmotility

Mucosa:  Smooth without irregularity or ulceration

Hypopharynx/cervical esophagus: Normal motion without laryngeal
penetration, aspiration, or residuals.

Hiatal hernia:  Absent

GE reflux:  Not witnessed during exam

Other:  N/A
IMPRESSION: Age-related esophageal dysmotility.

Otherwise negative exam.

## 2020-12-12 NOTE — Progress Notes (Signed)
History of Present Illness: Cheryl Abbott is a 75 y.o. year old female presents for cysto following URS w/ stone mgmgt.  Her procedure was performed on Oct 18, 2020.  She has had several visits both here and to the emergency room with worries about urinary tract infection.  She has had no recent blood in her urine.  She does have frequency and urgency.  Past Medical History:  Diagnosis Date   Abnormal brain scan 02/07/2012   Anemia    Anxiety    Anxiety disorder    Arthritis    Brain TIA 08/19/2015   Cancer (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 9     History of adenomatous polyp of colon    2008   History of kidney stones    Hypercalcemia    Hypercalcemia 11/30/2010   Hyperlipidemia    Hypertension    Lactose intolerance in adult 02/01/2016   Left ureteral stone    Lipoma of arm 01/19/2015   Lumbar disc disease with radiculopathy    Nephrolithiasis    Neuropathic pain of both feet 04/11/2016   Neuropathy, peripheral    Nocturnal hypoxia 02/04/2014   Study confirms this done 02/10/2014    OSA treated with BiPAP    per study 2007   RENAL CALCULUS 03/15/2009   Qualifier: Diagnosis of  By: Zeb Comfort     Sciatica    Shoulder pain, bilateral    Sigmoid diverticulosis    TIA (transient ischemic attack) 02/26/2016   Type 2 diabetes mellitus ([place])    Vaginal dryness, menopausal 10/14/2016    Past Surgical History:  Procedure Laterality Date   BIOPSY N/A 03/17/2013   Procedure: GASTRIC BIOPSIES;  Surgeon: Danie Binder, MD;  Location: [facility];  Service: Endoscopy;  Laterality: N/A;   BIOPSY  06/10/2015   Procedure: BIOPSY;  Surgeon: Danie Binder, MD;  [facility];  Service: Endoscopy;;  gastric biopsy   CARDIAC CATHETERIZATION  05-11-2003  dr Shelva Majestic ([facility])    Abnormal cardiolite/   Normal coronary arteries and normal LVF,  ef  63%   CARDIOVASCULAR STRESS TEST  01-01-2014   normal lexiscan cardiolite/  no ischemia/ infarct/  normal LV function and wall motion ,  ef 81%   COLONOSCOPY  last one 10/02/2011   [facility] [place] TICS, IH, NEXT TCS APR 2018   COLONOSCOPY WITH PROPOFOL N/A 02/26/2017   Procedure: COLONOSCOPY WITH PROPOFOL;  Surgeon: Danie Binder, MD;  [facility];  Service: Endoscopy;  Laterality: N/A;  11:00am   COLONOSCOPY WITH PROPOFOL N/A 09/22/2019   Procedure: COLONOSCOPY WITH PROPOFOL;  Surgeon: Danie Binder, MD;  [facility];  Service: Endoscopy;  Laterality: N/A;  1:15   CYSTO/   RIGHT URETEROSOCOPY LASER LITHOTRIPSY STONE EXTRACTION  10-13-2004   CYSTO/  RIGHT RETROGRADE PYELOGRAM/  PLACMENT RIGHT URETERAL STENT  01-10-2010   CYSTOSCOPY W/ URETERAL STENT PLACEMENT Right 08/19/2015   Procedure: CYSTOSCOPY WITH RETROGRADE PYELOGRAM/URETERAL STENT PLACEMENT;  Surgeon: Franchot Gallo, MD;  Location: [facility];  Service: Urology;  Laterality: Right;   CYSTOSCOPY W/ URETERAL STENT PLACEMENT Left 02/23/2020   Procedure: CYSTOSCOPY LEFT  RETROGRADE PYELOGRAM LEFT URETEROSCOPY URETERAL STENT PLACEMENT;  Surgeon: Franchot Gallo, MD;  Location: [facility];  Service: Urology;  Laterality: Left;   CYSTOSCOPY WITH RETROGRADE PYELOGRAM, URETEROSCOPY AND STENT PLACEMENT Left 10/21/2014   Procedure: CYSTOSCOPY WITH RETROGRADE PYELOGRAM, URETEROSCOPY AND STENT PLACEMENT;  Surgeon: Franchot Gallo, MD;  Location: [facility];  Service: Urology;  Laterality: Left;   CYSTOSCOPY WITH RETROGRADE PYELOGRAM, URETEROSCOPY AND STENT PLACEMENT Right 11/22/2015   Procedure: CYSTOSCOPY, RIGHT URETERAL STENT REMOVAL; RIGHT RETROGRADE PYELOGRAM, RIGHT URETEROSCOPY WITH BALLOON DILATION; RIGHT URETERAL STENT PLACEMENT;  Surgeon: Franchot Gallo, MD;  Location: [facility];  Service: Urology;  Laterality: Right;   CYSTOSCOPY WITH RETROGRADE  PYELOGRAM, URETEROSCOPY AND STENT PLACEMENT Left 10/18/2020   Procedure: CYSTOSCOPY WITH LEFT RETROGRADE PYELOGRAM, LEFT URETEROSCOPY  WITH LASER AND LEFT URETERAL STENT PLACEMENT;  Surgeon: Franchot Gallo, MD;  [facility];  Service: Urology;  Laterality: Left;   CYSTOSCOPY/RETROGRADE/URETEROSCOPY/STONE EXTRACTION WITH BASKET Bilateral 08/02/2015   Procedure: CYSTOSCOPY; BILATERAL RETROGRADE PYELOGRAMS; BILATERAL URETEROSCOPY, STONE EXTRACTION WITH BASKET;  Surgeon: Franchot Gallo, MD;  [facility];  Service: Urology;  Laterality: Bilateral;   CYSTOSCOPY/RETROGRADE/URETEROSCOPY/STONE EXTRACTION WITH BASKET Right 06/28/2015   Procedure: CYSTOSCOPY/RETROGRADE/URETEROSCOPY/STONE EXTRACTION WITH BASKET, RIGHT URETERAL DOUBLE J STENT PLACEMENT;  Surgeon: Franchot Gallo, MD;  [facility];  Service: Urology;  Laterality: Right;   ESOPHAGOGASTRODUODENOSCOPY (EGD) WITH PROPOFOL N/A 03/17/2013   Procedure: ESOPHAGOGASTRODUODENOSCOPY (EGD) WITH PROPOFOL;  Surgeon: Danie Binder, MD;  [facility]: [facility];  Service: Endoscopy;  Laterality: N/A;   ESOPHAGOGASTRODUODENOSCOPY (EGD) WITH PROPOFOL N/A 06/10/2015   Distal gastritis, distal esophageal stricture s/p dilation   ESOPHAGOGASTRODUODENOSCOPY (EGD) WITH PROPOFOL N/A 02/26/2017   Procedure: ESOPHAGOGASTRODUODENOSCOPY (EGD) WITH PROPOFOL;  Surgeon: Danie Binder, MD;  [facility];  Service: Endoscopy;  Laterality: N/A;   FLEXIBLE SIGMOIDOSCOPY  09/11/2011   LOV:[id number] Internal hemorrhoids   HOLMIUM LASER APPLICATION Left 1/88/4166   Procedure: HOLMIUM LASER APPLICATION;  Surgeon: Franchot Gallo, MD;  [facility];  Service: Urology;  Laterality: Left;   HOLMIUM LASER APPLICATION Right 0/63/0160   Procedure: HOLMIUM LASER APPLICATION;  Surgeon: Franchot Gallo, MD;  Location: [facility];  Service: Urology;  Laterality: Right;   HOLMIUM LASER APPLICATION  06/19/3233   Procedure: HOLMIUM LASER APPLICATION;   Surgeon: Franchot Gallo, MD;  Location: [facility];  Service: Urology;;   HOLMIUM LASER APPLICATION Left 5/73/2202   Procedure: HOLMIUM LASER APPLICATION;  Surgeon: Franchot Gallo, MD;  Location: [facility];  Service: Urology;  Laterality: Left;   MASS EXCISION Left 03/04/2015   Procedure: EXCISION OF SOFT TISSUE NEOPLASM LEFT ARM;  Surgeon: Aviva Signs, MD;  Location: [facility];  Service: General;  Laterality: Left;   PERCUTANEOUS NEPHROSTOLITHOTOMY Bilateral 12/  2011     [facility]   POLYPECTOMY N/A 03/17/2013   Procedure: GASTRIC POLYPECTOMY;  Surgeon: Danie Binder, MD;  [facility];  Service: Endoscopy;  Laterality: N/A;   POLYPECTOMY  02/26/2017   Procedure: POLYPECTOMY;  Surgeon: Danie Binder, MD;  [facility];  Service: Endoscopy;;  polyp at cecum, ascending colon polyps x3, hepatic flexure polyps x8, transverse colon polyps x8    POLYPECTOMY  09/22/2019   Procedure: POLYPECTOMY;  Surgeon: Danie Binder, MD;  [facility];  Service: Endoscopy;;   REMOVAL RIGHT THIGH CYST  2006   SAVORY DILATION N/A 03/17/2013   Procedure: SAVORY DILATION;  Surgeon: Danie Binder, MD;  Location: [facility];  Service: Endoscopy;  Laterality: N/A;  #12.8, 14, 15, 16 dilators used   SAVORY DILATION N/A 06/10/2015  Procedure: SAVORY DILATION;  Surgeon: Danie Binder, MD;  [facility];  Service: Endoscopy;  Laterality: N/A;   TRANSTHORACIC ECHOCARDIOGRAM  01-01-2014   mild LVH/  ef 40-81%/  grade I diastolic dysfunction/  trivial MR, TR, and PR   VAGINAL HYSTERECTOMY  1970's    Home Medications:  (Not in a hospital admission)   Allergies:  Allergies  Allergen Reactions   Ace Inhibitors Cough    Patient doesn't recall   Keflex [Cephalexin] Diarrhea and Nausea And Vomiting   Nitrofurantoin Diarrhea and Nausea And Vomiting   Penicillins Hives and Other (See Comments)    Reaction:  Blisters on hands/feet  Has patient had a PCN reaction causing immediate rash,  facial/tongue/throat swelling, SOB or lightheadedness with hypotension: No Has patient had a PCN reaction causing severe rash involving mucus membranes or skin necrosis: No Has patient had a PCN reaction that required hospitalization No Has patient had a PCN reaction occurring within the last 10 years: No If all of the above answers are "NO", then may proceed with Cephalosporin use.    Family History  Problem Relation Age of Onset   Hypertension Mother    Diabetes Mother    Heart failure Mother    Dementia Mother    Emphysema Father    Hypertension Father    Diabetes Brother    GER disease Brother    Hypertension Sister    Hypertension Sister    Cancer Other        family history    Diabetes Other        family history    Heart defect Other        famiily history    Arthritis Other        family history    Anesthesia problems Neg Hx    Hypotension Neg Hx    Malignant hyperthermia Neg Hx    Pseudochol deficiency Neg Hx    Colon cancer Neg Hx     Social History:  reports that she quit smoking about 32 years ago. Her smoking use included cigarettes. She started smoking about 46 years ago. She has a 20.00 pack-year smoking history. She has never used smokeless tobacco. She reports that she does not drink alcohol and does not use drugs.  ROS: A complete review of systems was performed.  All systems are negative except for pertinent findings as noted.  Physical Exam:  Vital signs in last 24 hours: @VSRANGES @ General:  Alert and oriented, No acute distress HEENT: Normocephalic, atraumatic Cardiovascular: Regular rate  Lungs: Normal inspiratory/expiratory excursion Neurologic: Grossly intact  Cystoscopy Procedure Note:  Indication: Stent removal  After informed consent and discussion of the procedure and its risks, Cheryl Abbott was positioned and prepped in the standard fashion.  Cystoscopy was performed with a flexible cystoscope.   Findings: Ureteral orifices:not  well seem Bladder:Generalized hyperemic urothelium. Stent not visualized.  The patient tolerated the procedure although with difficulty.  Procedure was covered with Cipro     Impression/Assessment:  History of recent ureteroscopy with treatment of stone.  I cannot see her stent on cystoscopy today for removal  Plan:  I will send her for KUB to assess location of stent.  She may need ureteroscopic stent extraction.  Cheryl Abbott 12/12/2020, 4:56 PM  Cheryl Boxer. Marquelle Musgrave MD

## 2020-12-13 ENCOUNTER — Ambulatory Visit (INDEPENDENT_AMBULATORY_CARE_PROVIDER_SITE_OTHER): Payer: Medicare Other | Admitting: Urology

## 2020-12-13 ENCOUNTER — Encounter: Payer: Self-pay | Admitting: Urology

## 2020-12-13 ENCOUNTER — Other Ambulatory Visit: Payer: Self-pay

## 2020-12-13 ENCOUNTER — Ambulatory Visit (HOSPITAL_COMMUNITY)
Admission: RE | Admit: 2020-12-13 | Discharge: 2020-12-13 | Disposition: A | Discharge status: Home / Self Care | Payer: Medicare Other | Source: Ambulatory Visit | Attending: Urology | Admitting: Urology

## 2020-12-13 ENCOUNTER — Other Ambulatory Visit: Payer: Self-pay | Admitting: Urology

## 2020-12-13 VITALS — BP 133/76 | HR 105 | Temp 98.5°F

## 2020-12-13 DIAGNOSIS — N201 Calculus of ureter: Secondary | ICD-10-CM | POA: Diagnosis not present

## 2020-12-13 DIAGNOSIS — Z87442 Personal history of urinary calculi: Secondary | ICD-10-CM | POA: Diagnosis not present

## 2020-12-13 LAB — MICROSCOPIC EXAMINATION
Epithelial Cells (non renal): >10 /hpf — AB (ref 0–10)
Renal Epithel, UA: NONE SEEN /hpf

## 2020-12-13 LAB — URINALYSIS, ROUTINE W REFLEX MICROSCOPIC
Bilirubin, UA: NEGATIVE
Glucose, UA: NEGATIVE
Ketones, UA: NEGATIVE
Nitrite, UA: NEGATIVE
Specific Gravity, UA: 1.015 (ref 1.005–1.030)
Urobilinogen, Ur: 0.2 mg/dL (ref 0.2–1.0)
pH, UA: 7.5 (ref 5.0–7.5)

## 2020-12-13 MED ORDER — CIPROFLOXACIN HCL 500 MG PO TABS
500.0000 mg | ORAL_TABLET | Freq: Once | ORAL | Status: AC
  Administered 2020-12-13: 500 mg via ORAL

## 2020-12-13 NOTE — Progress Notes (Signed)
Urological Symptom Review  Patient is experiencing the following symptoms: Frequent urination Hard to postpone urination Burning/pain with urination Get up at night to urinate Leakage of urine Trouble starting stream Have to strain to urinate Blood in urine Urinary tract infection Weak stream   Review of Systems  Gastrointestinal (upper)  : Negative for upper GI symptoms  Gastrointestinal (lower) : Constipation  Constitutional : Night Sweats Weight loss Fatigue  Skin: Itching  Eyes: Blurred vision  Ear/Nose/Throat : Negative for Ear/Nose/Throat symptoms  Hematologic/Lymphatic: Negative for Hematologic/Lymphatic symptoms  Cardiovascular : Chest pain  Respiratory : Shortness of breath  Endocrine: Excessive thirst  Musculoskeletal: Back pain Joint pain  Neurological: Negative for neurological symptoms  Psychologic: Depression Anxiety

## 2020-12-15 ENCOUNTER — Other Ambulatory Visit: Payer: Self-pay | Admitting: Urology

## 2020-12-15 ENCOUNTER — Telehealth: Payer: Self-pay | Admitting: Urology

## 2020-12-15 ENCOUNTER — Other Ambulatory Visit: Payer: Self-pay

## 2020-12-15 DIAGNOSIS — N3000 Acute cystitis without hematuria: Secondary | ICD-10-CM

## 2020-12-15 NOTE — Telephone Encounter (Signed)
Patient called wanting to know if she can get Cipro called in to [facility] on [address] in [place].

## 2020-12-15 NOTE — Telephone Encounter (Signed)
Were you going to send her in an antibiotic?

## 2020-12-17 DIAGNOSIS — R0902 Hypoxemia: Secondary | ICD-10-CM | POA: Diagnosis not present

## 2020-12-20 ENCOUNTER — Other Ambulatory Visit: Payer: Medicare Other

## 2020-12-20 ENCOUNTER — Other Ambulatory Visit: Payer: Self-pay | Admitting: Urology

## 2020-12-20 ENCOUNTER — Other Ambulatory Visit: Payer: Self-pay

## 2020-12-20 DIAGNOSIS — N3000 Acute cystitis without hematuria: Secondary | ICD-10-CM | POA: Diagnosis not present

## 2020-12-20 DIAGNOSIS — N201 Calculus of ureter: Secondary | ICD-10-CM

## 2020-12-20 LAB — URINALYSIS, ROUTINE W REFLEX MICROSCOPIC
Bilirubin, UA: NEGATIVE
Glucose, UA: NEGATIVE
Nitrite, UA: NEGATIVE
Specific Gravity, UA: 1.025 (ref 1.005–1.030)
Urobilinogen, Ur: 0.2 mg/dL (ref 0.2–1.0)
pH, UA: 6 (ref 5.0–7.5)

## 2020-12-20 MED ORDER — OXYCODONE HCL 5 MG PO TABS: ORAL_TABLET | ORAL | 0 refills | Status: DC

## 2020-12-20 NOTE — Progress Notes (Unsigned)
Patient here today to leave urine sample for recheck.   Per Dr. Diona Fanti pt will return in 1-2 weeks for stent removal pending urine culture. Pt notified. Pt also notified pain medication was sent to pharmacy for patient to take 1 hour prior to stent removal appt.

## 2020-12-22 ENCOUNTER — Telehealth: Payer: Self-pay | Admitting: *Deleted

## 2020-12-22 LAB — URINE CULTURE

## 2020-12-22 NOTE — Telephone Encounter (Signed)
Pt is having a stent removed from dr dalstedt and wanted to know if dr simpson would call in numbing gel for procedure. Advised her this normally needs to come from dalstedt since he is doing procedure. She stated dr Moshe Cipro will call in for her

## 2020-12-25 IMAGING — DX DG CHEST 2V
2 series · 2 of 2 positions shown · non-contrast
Comparison: Radiographs March 11, 2018.

CLINICAL DATA: Productive cough.

EXAM:
CHEST - 2 VIEW

[chest pa]
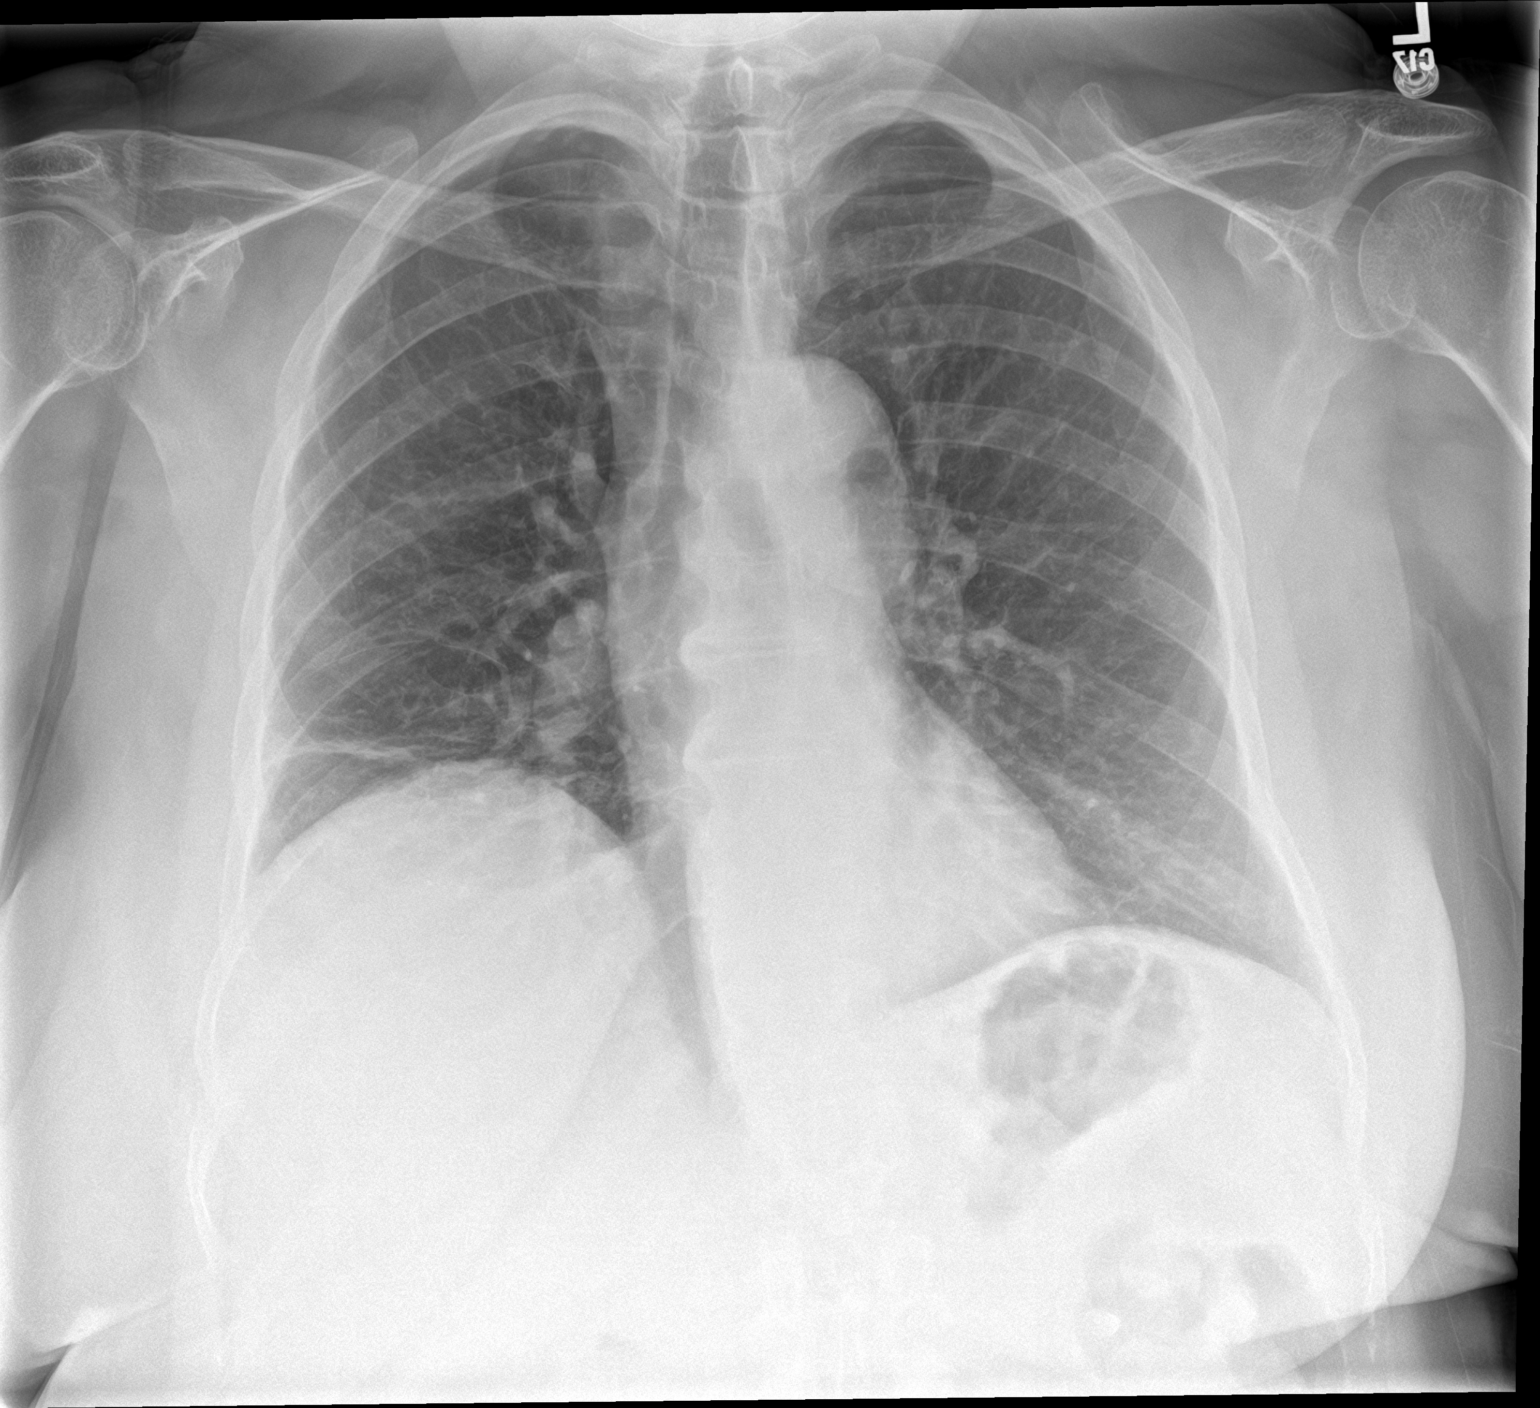

[chest lat]
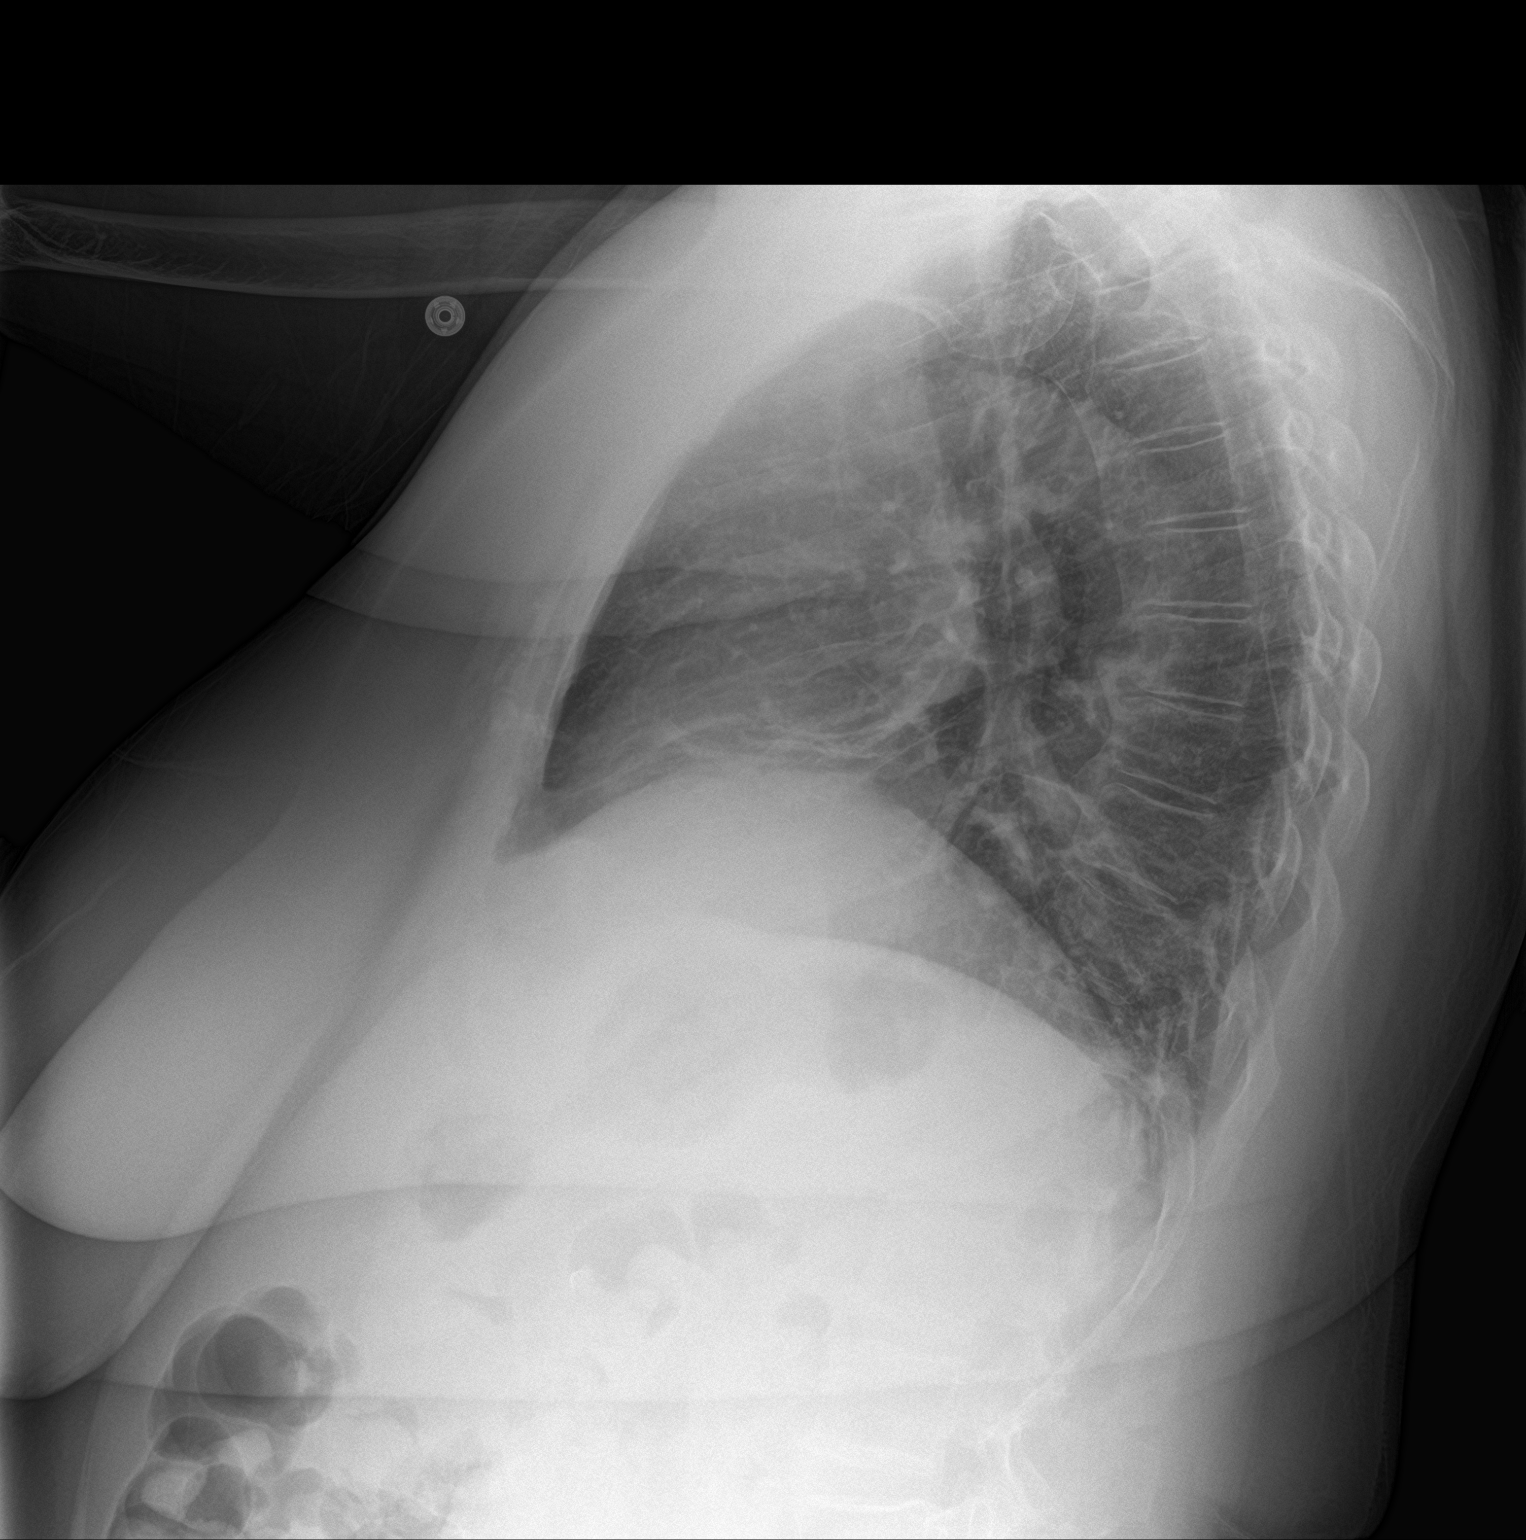

[2 of 2 positions shown; findings below may reference images not displayed]

FINDINGS: The heart size and mediastinal contours are within normal limits. No
pneumothorax or pleural effusion is noted. Left lung is clear.
Stable elevated right hemidiaphragm with mild right basilar
subsegmental atelectasis. The visualized skeletal structures are
unremarkable.
IMPRESSION: Stable elevated right hemidiaphragm with mild right basilar
subsegmental atelectasis. No other abnormality seen in the chest.

## 2020-12-26 ENCOUNTER — Telehealth: Payer: Self-pay

## 2020-12-26 DIAGNOSIS — M50222 Other cervical disc displacement at C5-C6 level: Secondary | ICD-10-CM | POA: Diagnosis not present

## 2020-12-26 DIAGNOSIS — M79601 Pain in right arm: Secondary | ICD-10-CM | POA: Diagnosis not present

## 2020-12-26 DIAGNOSIS — M50223 Other cervical disc displacement at C6-C7 level: Secondary | ICD-10-CM | POA: Insufficient documentation

## 2020-12-26 DIAGNOSIS — M542 Cervicalgia: Secondary | ICD-10-CM | POA: Diagnosis not present

## 2020-12-26 DIAGNOSIS — M25511 Pain in right shoulder: Secondary | ICD-10-CM | POA: Diagnosis not present

## 2020-12-26 NOTE — Telephone Encounter (Signed)
Pt aware.

## 2020-12-30 ENCOUNTER — Telehealth: Payer: Self-pay

## 2020-12-30 NOTE — Telephone Encounter (Signed)
Patient notified

## 2020-12-30 NOTE — Telephone Encounter (Signed)
-----   Message from Franchot Gallo, MD sent at 12/27/2020 12:31 PM EDT ----- Culture was negative ----- Message ----- From: Iris Pert, LPN Sent: [id number]/73/4287   8:28 AM EDT To: Franchot Gallo, MD  Please review

## 2021-01-02 ENCOUNTER — Telehealth: Payer: Self-pay

## 2021-01-02 NOTE — Telephone Encounter (Signed)
Pt.notified

## 2021-01-02 NOTE — Telephone Encounter (Signed)
-----   Message from Franchot Gallo, MD sent at 12/27/2020 12:31 PM EDT ----- Culture was negative ----- Message ----- From: Iris Pert, LPN Sent: [phone]/61/[id number]   8:28 AM EDT To: Franchot Gallo, MD  Please review

## 2021-01-03 ENCOUNTER — Other Ambulatory Visit: Payer: Medicare Other | Admitting: Urology

## 2021-01-10 ENCOUNTER — Other Ambulatory Visit: Payer: Self-pay

## 2021-01-10 ENCOUNTER — Encounter: Payer: Self-pay | Admitting: Urology

## 2021-01-10 ENCOUNTER — Ambulatory Visit (INDEPENDENT_AMBULATORY_CARE_PROVIDER_SITE_OTHER): Payer: Medicare Other | Admitting: Urology

## 2021-01-10 VITALS — BP 106/64 | HR 102

## 2021-01-10 DIAGNOSIS — N201 Calculus of ureter: Secondary | ICD-10-CM

## 2021-01-10 MED ORDER — SULFAMETHOXAZOLE-TRIMETHOPRIM 800-160 MG PO TABS: 1.0000 | ORAL_TABLET | Freq: Two times a day (BID) | ORAL | 0 refills | Status: DC

## 2021-01-10 MED ORDER — CIPROFLOXACIN HCL 500 MG PO TABS: 500.0000 mg | ORAL_TABLET | Freq: Once | ORAL | Status: DC

## 2021-01-10 MED ORDER — GENTAMICIN SULFATE 40 MG/ML IJ SOLN
160.0000 mg | Freq: Once | INTRAMUSCULAR | Status: AC
  Administered 2021-01-10: 160 mg via INTRAMUSCULAR

## 2021-01-10 NOTE — Progress Notes (Signed)
History of Present Illness: Cheryl Abbott is a 75 y.o. 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 9     History of adenomatous polyp of colon    2008   History of kidney stones    Hypercalcemia    Hypercalcemia 11/30/2010   Hyperlipidemia    Hypertension    Lactose intolerance in adult 02/01/2016   Left ureteral stone    Lipoma of arm 01/19/2015   Lumbar disc disease with radiculopathy    Nephrolithiasis    Neuropathic pain of both feet 04/11/2016   Neuropathy, peripheral    Nocturnal hypoxia 02/04/2014   Study confirms this done 02/10/2014    OSA treated with BiPAP    per study 2007   RENAL CALCULUS 03/15/2009   Qualifier: Diagnosis of  By: Zeb Comfort     Sciatica    Shoulder pain, bilateral    Sigmoid diverticulosis    TIA (transient ischemic attack) 02/26/2016   Type 2 diabetes mellitus (HCC)    Vaginal dryness, menopausal 10/14/2016    Past Surgical History:  Procedure Laterality Date   BIOPSY N/A 03/17/2013   Procedure: GASTRIC BIOPSIES;  Surgeon: Danie Binder, MD;  Location: [facility];  Service: Endoscopy;  Laterality: N/A;   BIOPSY  06/10/2015   Procedure: BIOPSY;  Surgeon: Danie Binder, MD;  [facility];  Service: Endoscopy;;  gastric biopsy   CARDIAC CATHETERIZATION  05-11-2003  dr Shelva Majestic ([facility])   Abnormal cardiolite/   Normal coronary arteries and normal LVF,  ef  63%   CARDIOVASCULAR STRESS TEST  01-01-2014   normal lexiscan cardiolite/  no ischemia/ infarct/  normal LV function and wall motion  ,  ef 81%   COLONOSCOPY  last one 10/02/2011   MOD [place] TICS, IH, NEXT TCS APR 2018   COLONOSCOPY WITH PROPOFOL N/A 02/26/2017   Procedure: COLONOSCOPY WITH PROPOFOL;  Surgeon: Danie Binder, MD;  [facility];  Service: Endoscopy;  Laterality: N/A;  11:00am   COLONOSCOPY WITH PROPOFOL N/A 09/22/2019   Procedure: COLONOSCOPY WITH PROPOFOL;  Surgeon: Danie Binder, MD;  [facility];  Service: Endoscopy;  Laterality: N/A;  1:15   CYSTO/   RIGHT URETEROSOCOPY LASER LITHOTRIPSY STONE EXTRACTION  10-13-2004   CYSTO/  RIGHT RETROGRADE PYELOGRAM/  PLACMENT RIGHT URETERAL STENT  01-10-2010   CYSTOSCOPY W/ URETERAL STENT PLACEMENT Right 08/19/2015   Procedure: CYSTOSCOPY WITH RETROGRADE PYELOGRAM/URETERAL STENT PLACEMENT;  Surgeon: Franchot Gallo, MD;  Location: [facility];  Service: Urology;  Laterality: Right;   CYSTOSCOPY W/ URETERAL STENT PLACEMENT Left 02/23/2020   Procedure: CYSTOSCOPY LEFT  RETROGRADE PYELOGRAM LEFT URETEROSCOPY URETERAL STENT PLACEMENT;  Surgeon: Franchot Gallo, MD;  Location: [facility];  Service: Urology;  Laterality: Left;   CYSTOSCOPY WITH RETROGRADE PYELOGRAM, URETEROSCOPY AND STENT PLACEMENT Left 10/21/2014   Procedure: CYSTOSCOPY WITH RETROGRADE PYELOGRAM, URETEROSCOPY AND STENT PLACEMENT;  Surgeon: Franchot Gallo, MD;  Location: [facility];  Service: Urology;  Laterality: Left;   CYSTOSCOPY WITH RETROGRADE PYELOGRAM, URETEROSCOPY AND STENT PLACEMENT Right 11/22/2015   Procedure: CYSTOSCOPY, RIGHT URETERAL STENT REMOVAL; RIGHT RETROGRADE PYELOGRAM, RIGHT URETEROSCOPY WITH BALLOON DILATION; RIGHT URETERAL STENT PLACEMENT;  Surgeon: Franchot Gallo, MD;  Location: [facility];  Service: Urology;  Laterality: Right;   CYSTOSCOPY WITH RETROGRADE PYELOGRAM, URETEROSCOPY AND STENT PLACEMENT Left 10/18/2020   Procedure: CYSTOSCOPY WITH LEFT RETROGRADE PYELOGRAM, LEFT URETEROSCOPY  WITH LASER AND LEFT URETERAL STENT PLACEMENT;  Surgeon: Franchot Gallo, MD;  [facility]: [facility];  Service: Urology;  Laterality: Left;   CYSTOSCOPY/RETROGRADE/URETEROSCOPY/STONE EXTRACTION WITH BASKET Bilateral 08/02/2015   Procedure: CYSTOSCOPY; BILATERAL RETROGRADE PYELOGRAMS; BILATERAL URETEROSCOPY, STONE EXTRACTION WITH BASKET;  Surgeon: Franchot Gallo, MD;  [facility]: [facility];  Service: Urology;  Laterality: Bilateral;   CYSTOSCOPY/RETROGRADE/URETEROSCOPY/STONE EXTRACTION WITH BASKET Right 06/28/2015   Procedure: CYSTOSCOPY/RETROGRADE/URETEROSCOPY/STONE EXTRACTION WITH BASKET, RIGHT URETERAL DOUBLE J STENT PLACEMENT;  Surgeon: Franchot Gallo, MD;  [facility]: [facility];  Service: Urology;  Laterality: Right;   ESOPHAGOGASTRODUODENOSCOPY (EGD) WITH PROPOFOL N/A 03/17/2013   Procedure: ESOPHAGOGASTRODUODENOSCOPY (EGD) WITH PROPOFOL;  Surgeon: Danie Binder, MD;  [facility]: [facility];  Service: Endoscopy;  Laterality: N/A;   ESOPHAGOGASTRODUODENOSCOPY (EGD) WITH PROPOFOL N/A 06/10/2015   Distal gastritis, distal esophageal stricture s/p dilation   ESOPHAGOGASTRODUODENOSCOPY (EGD) WITH PROPOFOL N/A 02/26/2017   Procedure: ESOPHAGOGASTRODUODENOSCOPY (EGD) WITH PROPOFOL;  Surgeon: Danie Binder, MD;  [facility];  Service: Endoscopy;  Laterality: N/A;   FLEXIBLE SIGMOIDOSCOPY  09/11/2011   WYO:[id number] Internal hemorrhoids   HOLMIUM LASER APPLICATION Left 2/77/4128   Procedure: HOLMIUM LASER APPLICATION;  Surgeon: Franchot Gallo, MD;  [facility];  Service: Urology;  Laterality: Left;   HOLMIUM LASER APPLICATION Right 7/86/7672   Procedure: HOLMIUM LASER APPLICATION;  Surgeon: Franchot Gallo, MD;  [facility]: [facility];  Service: Urology;  Laterality: Right;   HOLMIUM LASER APPLICATION  0/94/7096   Procedure: HOLMIUM LASER APPLICATION;  Surgeon: Franchot Gallo, MD;  Location: [facility];  Service: Urology;;   HOLMIUM LASER APPLICATION Left 2/83/6629   Procedure: HOLMIUM LASER APPLICATION;  Surgeon: Franchot Gallo, MD;   Location: [facility];  Service: Urology;  Laterality: Left;   MASS EXCISION Left 03/04/2015   Procedure: EXCISION OF SOFT TISSUE NEOPLASM LEFT ARM;  Surgeon: Aviva Signs, MD;  Location: [facility];  Service: General;  Laterality: Left;   PERCUTANEOUS NEPHROSTOLITHOTOMY Bilateral 12/  2011     [facility]   POLYPECTOMY N/A 03/17/2013   Procedure: GASTRIC POLYPECTOMY;  Surgeon: Danie Binder, MD;  Location: [facility];  Service: Endoscopy;  Laterality: N/A;   POLYPECTOMY  02/26/2017   Procedure: POLYPECTOMY;  Surgeon: Danie Binder, MD;  [facility];  Service: Endoscopy;;  polyp at cecum, ascending colon polyps x3, hepatic flexure polyps x8, transverse colon polyps x8    POLYPECTOMY  09/22/2019   Procedure: POLYPECTOMY;  Surgeon: Danie Binder, MD;  [facility];  Service: Endoscopy;;   REMOVAL RIGHT THIGH CYST  2006   SAVORY DILATION N/A 03/17/2013   Procedure: SAVORY DILATION;  Surgeon: Danie Binder, MD;  Location: [facility];  Service: Endoscopy;  Laterality: N/A;  #12.8, 14, 15, 16 dilators used   SAVORY DILATION N/A 06/10/2015   Procedure: SAVORY DILATION;  Surgeon: Danie Binder, MD;  [facility];  Service: Endoscopy;  Laterality: N/A;   TRANSTHORACIC ECHOCARDIOGRAM  01-01-2014   mild LVH/  ef 47-65%/  grade I diastolic dysfunction/  trivial MR, TR,  and PR   VAGINAL HYSTERECTOMY  1970's    Home Medications:  (Not in a hospital admission)   Allergies:  Allergies  Allergen Reactions   Ace Inhibitors Cough    Patient doesn't recall   Keflex [Cephalexin] Diarrhea and Nausea And Vomiting   Nitrofurantoin Diarrhea and Nausea And Vomiting   Penicillins Hives and Other (See Comments)    Reaction:  Blisters on hands/feet  Has patient had a PCN reaction causing immediate rash, facial/tongue/throat swelling, SOB or lightheadedness with hypotension: No Has patient had a PCN reaction causing severe rash involving mucus membranes or skin necrosis: No Has patient had a PCN  reaction that required hospitalization No Has patient had a PCN reaction occurring within the last 10 years: No If all of the above answers are "NO", then may proceed with Cephalosporin use.    Family History  Problem Relation Age of Onset   Hypertension Mother    Diabetes Mother    Heart failure Mother    Dementia Mother    Emphysema Father    Hypertension Father    Diabetes Brother    GER disease Brother    Hypertension Sister    Hypertension Sister    Cancer Other        family history    Diabetes Other        family history    Heart defect Other        famiily history    Arthritis Other        family history    Anesthesia problems Neg Hx    Hypotension Neg Hx    Malignant hyperthermia Neg Hx    Pseudochol deficiency Neg Hx    Colon cancer Neg Hx     Social History:  reports that she quit smoking about 32 years ago. Her smoking use included cigarettes. She started smoking about 46 years ago. She has a 20.00 pack-year smoking history. She has never used smokeless tobacco. She reports that she does not drink alcohol and does not use drugs.  ROS: A complete review of systems was performed.  All systems are negative except for pertinent findings as noted.  Physical Exam:  Vital signs in last 24 hours: @VSRANGES @ General:  Alert and oriented, No acute distress HEENT: Normocephalic, atraumatic Neck: No JVD or lymphadenopathy Cardiovascular: Regular rate  Lungs: Normal inspiratory/expiratory excursion Extremities: No edema Neurologic: Grossly intact  Cystoscopy Procedure Note:  Indication: Stent removal  After informed consent and discussion of the procedure and its risks, Cheryl Abbott was positioned and prepped in the standard fashion.  Cystoscopy was performed with a flexible cystoscope.   Findings: Urethra: Normal Ureteral orifices: Normal with stent at left orifice Bladder: Normal  Left double-J stent was extracted intact.  The patient tolerated the  procedure well.     Impression/Assessment:  Left ureteral calculus, status post ureteroscopy, stone extraction and stent placement.  She underwent stent removal today  Plan:  1.  Urine for culture  2.  She received Cipro in the office today as well as gentamicin  3.  I will send in 1 week of sulfa  4.  Office visit in 1 month with renal ultrasound  Lillette Boxer Shahzain Kiester 01/10/2021, 1:06 PM  Lillette Boxer. Tomoya Ringwald MD

## 2021-01-10 NOTE — Progress Notes (Signed)
Urological Symptom Review  Patient is experiencing the following symptoms: Get up at night to urinate Trouble starting stream   Review of Systems  Gastrointestinal (upper)  : Negative for upper GI symptoms  Gastrointestinal (lower) : Negative for lower GI symptoms  Constitutional : Night Sweats Fatigue  Skin: Negative for skin symptoms  Eyes: Negative for eye symptoms  Ear/Nose/Throat : Negative for Ear/Nose/Throat symptoms  Hematologic/Lymphatic: Negative for Hematologic/Lymphatic symptoms  Cardiovascular : Leg swelling  Respiratory : Negative for respiratory symptoms  Endocrine: Negative for endocrine symptoms  Musculoskeletal: Negative for musculoskeletal symptoms  Neurological: Negative for neurological symptoms  Psychologic: Negative for psychiatric symptoms

## 2021-01-17 DIAGNOSIS — R0902 Hypoxemia: Secondary | ICD-10-CM | POA: Diagnosis not present

## 2021-01-18 NOTE — Telephone Encounter (Signed)
See note

## 2021-01-23 ENCOUNTER — Telehealth: Payer: Self-pay | Admitting: Family Medicine

## 2021-01-23 NOTE — Telephone Encounter (Signed)
Pt came in wanting her bloodwork completed, I show that it is done back in April,   Does the pt need more bloodwork ?  Please advise

## 2021-01-24 ENCOUNTER — Other Ambulatory Visit: Payer: Self-pay

## 2021-01-24 DIAGNOSIS — E1121 Type 2 diabetes mellitus with diabetic nephropathy: Secondary | ICD-10-CM

## 2021-01-24 DIAGNOSIS — E559 Vitamin D deficiency, unspecified: Secondary | ICD-10-CM

## 2021-01-24 DIAGNOSIS — I10 Essential (primary) hypertension: Secondary | ICD-10-CM

## 2021-01-24 NOTE — Telephone Encounter (Signed)
Patient aware she can come and have the labs done. Order placed

## 2021-01-26 ENCOUNTER — Ambulatory Visit: Payer: Medicare Other | Admitting: Orthopedic Surgery

## 2021-01-31 ENCOUNTER — Other Ambulatory Visit (HOSPITAL_COMMUNITY)
Admission: RE | Admit: 2021-01-31 | Discharge: 2021-01-31 | Disposition: A | Discharge status: Home / Self Care | Payer: Medicare Other | Source: Ambulatory Visit | Attending: Family Medicine | Admitting: Family Medicine

## 2021-01-31 ENCOUNTER — Inpatient Hospital Stay (HOSPITAL_COMMUNITY): Payer: Medicare Other | Attending: Hematology

## 2021-01-31 ENCOUNTER — Other Ambulatory Visit: Payer: Self-pay

## 2021-01-31 DIAGNOSIS — C9000 Multiple myeloma not having achieved remission: Secondary | ICD-10-CM | POA: Diagnosis not present

## 2021-01-31 DIAGNOSIS — E559 Vitamin D deficiency, unspecified: Secondary | ICD-10-CM | POA: Diagnosis not present

## 2021-01-31 DIAGNOSIS — E1121 Type 2 diabetes mellitus with diabetic nephropathy: Secondary | ICD-10-CM | POA: Insufficient documentation

## 2021-01-31 DIAGNOSIS — D472 Monoclonal gammopathy: Secondary | ICD-10-CM

## 2021-01-31 DIAGNOSIS — D509 Iron deficiency anemia, unspecified: Secondary | ICD-10-CM

## 2021-01-31 LAB — LIPID PANEL
Cholesterol: 162 mg/dL (ref 0–200)
HDL: 40 mg/dL — ABNORMAL LOW (ref >40)
LDL Cholesterol: 77 mg/dL (ref 0–99)
Total CHOL/HDL Ratio: 4.1 RATIO
Triglycerides: 227 mg/dL — ABNORMAL HIGH (ref <150)
VLDL: 45 mg/dL — ABNORMAL HIGH (ref 0–40)

## 2021-01-31 LAB — CBC WITH DIFFERENTIAL/PLATELET
Abs Immature Granulocytes: 0.04 10*3/uL (ref 0.00–0.07)
Basophils Absolute: 0.1 10*3/uL (ref 0.0–0.1)
Basophils Relative: 1 %
Eosinophils Absolute: 0.2 10*3/uL (ref 0.0–0.5)
Eosinophils Relative: 2 %
HCT: 40.5 % (ref 36.0–46.0)
Hemoglobin: 13.3 g/dL (ref 12.0–15.0)
Immature Granulocytes: 1 %
Lymphocytes Relative: 21 %
Lymphs Abs: 1.8 10*3/uL (ref 0.7–4.0)
MCH: 33.4 pg (ref 26.0–34.0)
MCHC: 32.8 g/dL (ref 30.0–36.0)
MCV: 101.8 fL — ABNORMAL HIGH (ref 80.0–100.0)
Monocytes Absolute: 0.8 10*3/uL (ref 0.1–1.0)
Monocytes Relative: 10 %
Neutro Abs: 5.6 10*3/uL (ref 1.7–7.7)
Neutrophils Relative %: 65 %
Platelets: 293 10*3/uL (ref 150–400)
RBC: 3.98 MIL/uL (ref 3.87–5.11)
RDW: 14.3 % (ref 11.5–15.5)
WBC: 8.4 10*3/uL (ref 4.0–10.5)
nRBC: 0 % (ref 0.0–0.2)

## 2021-01-31 LAB — COMPREHENSIVE METABOLIC PANEL
ALT: 17 U/L (ref 0–44)
AST: 18 U/L (ref 15–41)
Albumin: 4.4 g/dL (ref 3.5–5.0)
Alkaline Phosphatase: 90 U/L (ref 38–126)
Anion gap: 8 (ref 5–15)
BUN: 19 mg/dL (ref 8–23)
CO2: 25 mmol/L (ref 22–32)
Calcium: 9.8 mg/dL (ref 8.9–10.3)
Chloride: 103 mmol/L (ref 98–111)
Creatinine, Ser: 1.77 mg/dL — ABNORMAL HIGH (ref 0.44–1.00)
GFR, Estimated: 29 mL/min — ABNORMAL LOW (ref >60)
Glucose, Bld: 60 mg/dL — ABNORMAL LOW (ref 70–99)
Potassium: 4 mmol/L (ref 3.5–5.1)
Sodium: 136 mmol/L (ref 135–145)
Total Bilirubin: 0.4 mg/dL (ref 0.3–1.2)
Total Protein: 9 g/dL — ABNORMAL HIGH (ref 6.5–8.1)

## 2021-01-31 LAB — IRON AND TIBC
Iron: 79 ug/dL (ref 28–170)
Saturation Ratios: 19 % (ref 10.4–31.8)
TIBC: 410 ug/dL (ref 250–450)
UIBC: 331 ug/dL

## 2021-01-31 LAB — LACTATE DEHYDROGENASE: LDH: 115 U/L (ref 98–192)

## 2021-01-31 LAB — TSH: TSH: 1.917 u[IU]/mL (ref 0.350–4.500)

## 2021-01-31 LAB — HEMOGLOBIN A1C
Hgb A1c MFr Bld: 7 % — ABNORMAL HIGH (ref 4.8–5.6)
Mean Plasma Glucose: 154.2 mg/dL

## 2021-01-31 LAB — VITAMIN D 25 HYDROXY (VIT D DEFICIENCY, FRACTURES): Vit D, 25-Hydroxy: 40.71 ng/mL (ref 30–100)

## 2021-01-31 LAB — FERRITIN: Ferritin: 74 ng/mL (ref 11–307)

## 2021-02-01 ENCOUNTER — Other Ambulatory Visit (HOSPITAL_COMMUNITY)
Admission: RE | Admit: 2021-02-01 | Discharge: 2021-02-01 | Disposition: A | Discharge status: Home / Self Care | Payer: Medicare Other | Source: Ambulatory Visit | Attending: Family Medicine | Admitting: Family Medicine

## 2021-02-01 DIAGNOSIS — E1122 Type 2 diabetes mellitus with diabetic chronic kidney disease: Secondary | ICD-10-CM | POA: Insufficient documentation

## 2021-02-01 LAB — PROTEIN ELECTROPHORESIS, SERUM
A/G Ratio: 0.9 (ref 0.7–1.7)
Albumin ELP: 3.9 g/dL (ref 2.9–4.4)
Alpha-1-Globulin: 0.2 g/dL (ref 0.0–0.4)
Alpha-2-Globulin: 1 g/dL (ref 0.4–1.0)
Beta Globulin: 1.2 g/dL (ref 0.7–1.3)
Gamma Globulin: 2 g/dL — ABNORMAL HIGH (ref 0.4–1.8)
Globulin, Total: 4.4 g/dL — ABNORMAL HIGH (ref 2.2–3.9)
M-Spike, %: 1.4 g/dL — ABNORMAL HIGH
Total Protein ELP: 8.3 g/dL (ref 6.0–8.5)

## 2021-02-01 LAB — KAPPA/LAMBDA LIGHT CHAINS
Kappa free light chain: 56.5 mg/L — ABNORMAL HIGH (ref 3.3–19.4)
Kappa, lambda light chain ratio: 3.28 — ABNORMAL HIGH (ref 0.26–1.65)
Lambda free light chains: 17.2 mg/L (ref 5.7–26.3)

## 2021-02-02 LAB — MICROALBUMIN / CREATININE URINE RATIO
Creatinine, Urine: 164.1 mg/dL
Microalb Creat Ratio: 43 mg/g creat — ABNORMAL HIGH (ref 0–29)
Microalb, Ur: 70.9 ug/mL — ABNORMAL HIGH

## 2021-02-06 ENCOUNTER — Ambulatory Visit (INDEPENDENT_AMBULATORY_CARE_PROVIDER_SITE_OTHER): Payer: Medicare Other | Admitting: Orthopedic Surgery

## 2021-02-06 ENCOUNTER — Encounter: Payer: Self-pay | Admitting: Orthopedic Surgery

## 2021-02-06 ENCOUNTER — Other Ambulatory Visit: Payer: Self-pay

## 2021-02-06 VITALS — BP 130/74 | HR 98 | Ht 66.5 in | Wt 195.0 lb

## 2021-02-06 DIAGNOSIS — M47812 Spondylosis without myelopathy or radiculopathy, cervical region: Secondary | ICD-10-CM

## 2021-02-06 DIAGNOSIS — M25512 Pain in left shoulder: Secondary | ICD-10-CM

## 2021-02-06 DIAGNOSIS — G8929 Other chronic pain: Secondary | ICD-10-CM | POA: Diagnosis not present

## 2021-02-06 NOTE — Patient Instructions (Addendum)
While we are working on your approval for MRI please go ahead and call to schedule your appointment with [facility] within at least one (1) week.   Central Scheduling [phone]  

## 2021-02-06 NOTE — Progress Notes (Signed)
Chief Complaint  Patient presents with   Shoulder Pain    Left/ states has seen Dr Vertell Limber he did not want to do surgery and referred back here.     Follow up 3 months     Encounter Diagnoses  Name Primary?   Chronic left shoulder pain Yes   Spondylosis without myelopathy or radiculopathy, cervical region     Today : Mr. Luana Shu saw Dr. Vertell Limber he did not feel surgery was needed she still complains of neck pain in the axial cervical spine and radiates to the left shoulder blade but she also has pain and weakness and pain with forward elevation left shoulder and on clinical exam although she has tenderness in the axial spine and medial border the scapula she also has weakness in abduction and empty can position and we are recommending MRI left shoulder  She will continue the gabapentin 300 mg to help her sleep at night and then she will refill her tramadol with primary care  I will see her after her MRI has been completed

## 2021-02-07 ENCOUNTER — Ambulatory Visit (HOSPITAL_COMMUNITY): Payer: Medicare Other | Admitting: Physician Assistant

## 2021-02-07 ENCOUNTER — Other Ambulatory Visit: Payer: Self-pay | Admitting: Cardiology

## 2021-02-07 ENCOUNTER — Other Ambulatory Visit: Payer: Self-pay | Admitting: Family Medicine

## 2021-02-07 ENCOUNTER — Telehealth: Payer: Self-pay

## 2021-02-07 NOTE — Telephone Encounter (Signed)
Pt needs a refill on Tramadol.

## 2021-02-08 NOTE — Telephone Encounter (Signed)
Appointment scheduled.

## 2021-02-08 NOTE — Progress Notes (Signed)
Virtual Visit via Telephone Note [facility]  I connected with Cheryl Abbott  on 02/09/21  at 2:54 PM by telephone and verified that I am speaking with the correct person using two identifiers.  Location: Patient: Home Provider: [facility]   I discussed the limitations, risks, security and privacy concerns of performing an evaluation and management service by telephone and the availability of in person appointments. I also discussed with the patient that there may be a patient responsible charge related to this service. The patient expressed understanding and agreed to proceed.   HISTORY OF PRESENT ILLNESS: Cheryl Abbott 75 y.o. 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  She was last seen in office by Tarri Abernethy PA-C on 11/01/2020.  At today's visit, she reports feeling fair.  She denies any recent hospitalizations, new diagnoses, or changes in her baseline health status.  Cheryl Abbott has multiple chronic complaints including fatigue, poor appetite, dyspnea on exertion, and intermittent sharp chest pain (none today).  She also reports chronic ankle swelling.  She reports that her abdomen feels more distended than usual.  She is also worried about her chronic left leg sciatica and back problems.  She states that she has been dropping things from time to time, and that she feels weaker in her left arm, which she says is secondary to shoulder and neck pain.  She complains of a rash on her arms and neck.  Her PCP is aware of these problems, and the patient has been referred to appropriate specialists such as orthopedics and neurology.   Regarding her iron deficiency anemia smoldering myeloma, patient denies any current signs or symptoms of blood loss such as hematemesis, hematochezia, melena, hematuria, or hematemesis.  She has chronic joint pain, but has not noted any new bone pain or fractures.  No B  symptoms such as fever, chills, night sweats, unintentional weight loss.  She denies any presyncopal episodes or palpitations.  No new neurologic symptoms such as tinnitus, blurred vision, headache, or dizziness.  She denies any new numbness or tingling in her hands or feet.  She has not had any thromboembolic events since her last visit.  She denies any new lumps or bumps.  She reports 50% energy with 50% appetite, and reports that she is maintaining a stable weight at this time.    OBSERVATIONS/OBJECTIVE: Review of Systems  Constitutional:  Positive for malaise/fatigue. Negative for chills, diaphoresis, fever and weight loss.  HENT:  Negative for hearing loss, nosebleeds and tinnitus.   Eyes:  Negative for blurred vision.  Respiratory:  Positive for shortness of breath (with exertion). Negative for cough.   Cardiovascular:  Positive for chest pain (intermittent, brief - NONE today) and leg swelling (chronic ankle swelling). Negative for palpitations.  Gastrointestinal:  Negative for abdominal pain, blood in stool, melena, nausea and vomiting.       Stomach distension  Musculoskeletal:  Positive for back pain and joint pain.  Skin:  Positive for rash.  Neurological:  Positive for weakness. Negative for dizziness and headaches.    PHYSICAL EXAM (per limitations of virtual telephone visit): The patient is alert and oriented x 3, exhibiting adequate mentation, good mood, and ability to speak in full sentences and execute sound judgement.   ASSESSMENT & PLAN: 1.  IgG kappa smoldering myeloma: - Diagnosed in September 2018 after nephrology work-up found M spike in urine (8.3 mg per 24 hours) and serum (  1.5 g/dL) - Immunofixation shows IgG monoclonal protein with kappa light chain specificity - Bone marrow biopsy on 02/19/2017 showed trilineage hematopoiesis, plasma cells 14%, cytogenetics normal, FISH panel normal - Skeletal survey on 02/01/2020 did not show any lytic lesions.  - Most recent  labs (01/31/2021): M spike stable at 1.4, elevated kappa light chains 56.5 and free light chain ratio 3.28 relatively stable at baseline; Hgb 13.3, creatinine 1.77 (at baseline CKD IV), calcium 9.8; LDH normal 115 - No B symptoms or new bone pain - No current CRAB criteria observed - Per [facility] 20/20/2 guidelines, patient is not high risk SMM, no treatment indicated at this time, only continued close observation - PLAN: Repeat myeloma panel and RTC in 3 months.  Repeat skeletal survey prior to next visit.  2.  Normocytic anemia with iron deficiency: - She had a colonoscopy on 09/22/2019 which showed polyps, diverticulosis, and external and internal hemorrhoids  - Has required intermittent IV iron infusions (unable to tolerate oral iron due to severe constipation); last IV iron in April 2022 - Most recent labs (01/31/2021): Hgb 13.3, ferritin 74, iron saturation 19% - Symptomatic with chronic fatigue and dyspnea on exertion, which seems unrelated to her blood or iron levels - PLAN: No indication for IV iron at this time.  We will repeat labs in 3 months and reassess at return visit.   FOLLOW UP INSTRUCTIONS: - Myeloma panel, anemia panel, and skeletal survey in 3 months - Office visit after labs/x-ray    I discussed the assessment and treatment plan with the patient. The patient was provided an opportunity to ask questions and all were answered. The patient agreed with the plan and demonstrated an understanding of the instructions.   The patient was advised to call back or seek an in-person evaluation if the symptoms worsen or if the condition fails to improve as anticipated.  I provided 22 minutes of non-face-to-face time during this encounter.   Harriett Rush, PA-C 02/09/21 3:32 PM

## 2021-02-08 NOTE — Telephone Encounter (Signed)
Next available appt is fine

## 2021-02-09 ENCOUNTER — Encounter (HOSPITAL_COMMUNITY): Payer: Self-pay | Admitting: Physician Assistant

## 2021-02-09 ENCOUNTER — Inpatient Hospital Stay (HOSPITAL_COMMUNITY): Payer: Medicare Other | Attending: Physician Assistant | Admitting: Physician Assistant

## 2021-02-09 ENCOUNTER — Other Ambulatory Visit: Payer: Self-pay

## 2021-02-09 ENCOUNTER — Ambulatory Visit: Payer: Medicare Other | Admitting: Orthopedic Surgery

## 2021-02-09 VITALS — Wt 195.0 lb

## 2021-02-09 DIAGNOSIS — C9000 Multiple myeloma not having achieved remission: Secondary | ICD-10-CM | POA: Diagnosis not present

## 2021-02-09 DIAGNOSIS — D509 Iron deficiency anemia, unspecified: Secondary | ICD-10-CM | POA: Diagnosis not present

## 2021-02-09 DIAGNOSIS — D472 Monoclonal gammopathy: Secondary | ICD-10-CM

## 2021-02-16 ENCOUNTER — Ambulatory Visit (HOSPITAL_COMMUNITY)
Admission: RE | Admit: 2021-02-16 | Discharge: 2021-02-16 | Disposition: A | Discharge status: Home / Self Care | Payer: Medicare Other | Source: Ambulatory Visit | Attending: Orthopedic Surgery | Admitting: Orthopedic Surgery

## 2021-02-16 ENCOUNTER — Ambulatory Visit (HOSPITAL_COMMUNITY)
Admission: RE | Admit: 2021-02-16 | Discharge: 2021-02-16 | Disposition: A | Discharge status: Home / Self Care | Payer: Medicare Other | Source: Ambulatory Visit | Attending: Physician Assistant | Admitting: Physician Assistant

## 2021-02-16 ENCOUNTER — Other Ambulatory Visit: Payer: Self-pay

## 2021-02-16 DIAGNOSIS — D472 Monoclonal gammopathy: Secondary | ICD-10-CM

## 2021-02-16 DIAGNOSIS — M25412 Effusion, left shoulder: Secondary | ICD-10-CM | POA: Diagnosis not present

## 2021-02-16 DIAGNOSIS — M25512 Pain in left shoulder: Secondary | ICD-10-CM | POA: Diagnosis not present

## 2021-02-16 DIAGNOSIS — C9000 Multiple myeloma not having achieved remission: Secondary | ICD-10-CM

## 2021-02-16 DIAGNOSIS — M7552 Bursitis of left shoulder: Secondary | ICD-10-CM | POA: Diagnosis not present

## 2021-02-16 DIAGNOSIS — G8929 Other chronic pain: Secondary | ICD-10-CM | POA: Diagnosis not present

## 2021-02-16 DIAGNOSIS — M19012 Primary osteoarthritis, left shoulder: Secondary | ICD-10-CM | POA: Diagnosis not present

## 2021-02-17 ENCOUNTER — Ambulatory Visit (INDEPENDENT_AMBULATORY_CARE_PROVIDER_SITE_OTHER): Payer: Medicare Other | Admitting: Family Medicine

## 2021-02-17 ENCOUNTER — Encounter: Payer: Self-pay | Admitting: Family Medicine

## 2021-02-17 VITALS — BP 111/66 | HR 87 | Resp 16 | Ht 67.0 in | Wt 194.0 lb

## 2021-02-17 DIAGNOSIS — M4722 Other spondylosis with radiculopathy, cervical region: Secondary | ICD-10-CM | POA: Diagnosis not present

## 2021-02-17 DIAGNOSIS — F324 Major depressive disorder, single episode, in partial remission: Secondary | ICD-10-CM | POA: Diagnosis not present

## 2021-02-17 DIAGNOSIS — E785 Hyperlipidemia, unspecified: Secondary | ICD-10-CM | POA: Diagnosis not present

## 2021-02-17 DIAGNOSIS — Z23 Encounter for immunization: Secondary | ICD-10-CM

## 2021-02-17 DIAGNOSIS — F411 Generalized anxiety disorder: Secondary | ICD-10-CM | POA: Diagnosis not present

## 2021-02-17 DIAGNOSIS — E1121 Type 2 diabetes mellitus with diabetic nephropathy: Secondary | ICD-10-CM

## 2021-02-17 DIAGNOSIS — R0902 Hypoxemia: Secondary | ICD-10-CM | POA: Diagnosis not present

## 2021-02-17 NOTE — Patient Instructions (Addendum)
Keep annual exam as before, call if you need me sooner  Good foot exam  Flu  vaccine in office today  Please schedule diabetic eye exam in office for screening before she leaves   Nurse please send for and documanent shingrix vaccines obtained at [facility] , [address]

## 2021-02-19 ENCOUNTER — Telehealth: Payer: Self-pay | Admitting: Family Medicine

## 2021-02-19 ENCOUNTER — Encounter: Payer: Self-pay | Admitting: Family Medicine

## 2021-02-19 NOTE — Assessment & Plan Note (Signed)
Controlled, no change in medication  

## 2021-02-19 NOTE — Assessment & Plan Note (Signed)
Hyperlipidemia:Low fat diet discussed and encouraged.   Lipid Panel  Lab Results  Component Value Date   CHOL 162 01/31/2021   HDL 40 (L) 01/31/2021   LDLCALC 77 01/31/2021   TRIG 227 (H) 01/31/2021   CHOLHDL 4.1 01/31/2021   Needs to lower fat intake and increase activity

## 2021-02-19 NOTE — Assessment & Plan Note (Signed)
Cheryl Abbott is reminded of the importance of commitment to daily physical activity for 30 minutes or more, as able and the need to limit carbohydrate intake to 30 to 60 grams per meal to help with blood sugar control.   The need to take medication as prescribed, test blood sugar as directed, and to call between visits if there is a concern that blood sugar is uncontrolled is also discussed.   Cheryl Abbott is reminded of the importance of daily foot exam, annual eye examination, and good blood sugar, blood pressure and cholesterol control. Adequately but not as well controlled, no med change  Diabetic Labs Latest Ref Rng & Units 02/01/2021 01/31/2021 11/29/2020 10/25/2020 10/05/2020  HbA1c 4.8 - 5.6 % - 7.0(H) - - 6.4(H)  Microalbumin Not Estab. ug/mL 70.9(H) - - - -  Micro/Creat Ratio 0 - 29 mg/g creat 43(H) - - - -  Chol 0 - 200 mg/dL - 162 - - 156  HDL >40 mg/dL - 40(L) - - 45  Calc LDL 0 - 99 mg/dL - 77 - - 83  Triglycerides <150 mg/dL - 227(H) - - 165(H)  Creatinine 0.44 - 1.00 mg/dL - 1.77(H) 2.13(H) 1.92(H) 2.34(H)   BP/Weight 02/17/2021 02/09/2021 02/06/2021 01/10/2021 12/13/2020 02/03/36 0/09/8887  Systolic BP 169 - 450 388 828 003 491  Diastolic BP 66 - 74 64 76 96 66  Wt. (Lbs) 194 195 195 - - 195 -  BMI 30.38 31 31 - - 30.54 30.7   Foot/eye exam completion dates Latest Ref Rng & Units 02/17/2021 01/07/2020  Eye Exam No Retinopathy - -  Foot exam Order - - -  Foot Form Completion - Done Done

## 2021-02-19 NOTE — Telephone Encounter (Signed)
No call to be made

## 2021-02-19 NOTE — Progress Notes (Signed)
Cheryl Abbott     MRN: [id number]      DOB: April 13, 1946   HPI Cheryl Abbott is here for follow up and re-evaluation of chronic medical conditions, medication management and review of any available recent lab and radiology data.  Preventive health is updated, specifically  Cancer screening and Immunization.   Questions or concerns regarding consultations or procedures which the PT has had in the interim are  addressed. The PT denies any adverse reactions to current medications since the last visit.  There are no new concerns.  There are no specific complaints   ROS Denies recent fever or chills. Denies sinus pressure, nasal congestion, ear pain or sore throat. Denies chest congestion, productive cough or wheezing. Denies chest pains, palpitations and leg swelling Denies abdominal pain, nausea, vomiting,diarrhea or constipation.   Denies dysuria, frequency, hesitancy or incontinence. Chronic joint pain, swelling and limitation in mobility. Denies headaches, seizures, numbness, or tingling. Denies uncontrolled  depression, anxiety or insomnia. Denies skin break down or rash.   PE  BP 111/66   Pulse 87   Resp 16   Ht 5\' 7"  (1.702 m)   Wt 194 lb (88 kg)   SpO2 92%   BMI 30.38 kg/m   Patient alert and oriented and in no cardiopulmonary distress.  HEENT: No facial asymmetry, EOMI,     Neck supple .  Chest: Clear to auscultation bilaterally.  CVS: S1, S2 no murmurs, no S3.Regular rate.  ABD: Soft non tender.   Ext: No edema  MS: decreased though Adequate ROM spine, shoulders, hips and knees.  Skin: Intact, no ulcerations or rash noted.  Psych: Good eye contact, normal affect. Memory intact not anxious or depressed appearing.  CNS: CN 2-12 intact, power,  normal throughout.no focal deficits noted.   Assessment & Plan  Generalized anxiety disorder Controlled, no change in medication   Depression, major, single episode, in partial remission (HCC) Controlled, no  change in medication   Type 2 diabetes with nephropathy [facility]) Cheryl Abbott is reminded of the importance of commitment to daily physical activity for 30 minutes or more, as able and the need to limit carbohydrate intake to 30 to 60 grams per meal to help with blood sugar control.   The need to take medication as prescribed, test blood sugar as directed, and to call between visits if there is a concern that blood sugar is uncontrolled is also discussed.   Cheryl Abbott is reminded of the importance of daily foot exam, annual eye examination, and good blood sugar, blood pressure and cholesterol control. Adequately but not as well controlled, no med change  Diabetic Labs Latest Ref Rng & Units 02/01/2021 01/31/2021 11/29/2020 10/25/2020 10/05/2020  HbA1c 4.8 - 5.6 % - 7.0(H) - - 6.4(H)  Microalbumin Not Estab. ug/mL 70.9(H) - - - -  Micro/Creat Ratio 0 - 29 mg/g creat 43(H) - - - -  Chol 0 - 200 mg/dL - 162 - - 156  HDL >40 mg/dL - 40(L) - - 45  Calc LDL 0 - 99 mg/dL - 77 - - 83  Triglycerides <150 mg/dL - 227(H) - - 165(H)  Creatinine 0.44 - 1.00 mg/dL - 1.77(H) 2.13(H) 1.92(H) 2.34(H)   BP/Weight 02/17/2021 02/09/2021 02/06/2021 01/10/2021 12/13/2020 07/05/5807 02/16/3381  Systolic BP 505 - 397 673 419 379 024  Diastolic BP 66 - 74 64 76 96 66  Wt. (Lbs) 194 195 195 - - 195 -  BMI 30.38 31 31 - - 30.54 30.7  Foot/eye exam completion dates Latest Ref Rng & Units 02/17/2021 01/07/2020  Eye Exam No Retinopathy - -  Foot exam Order - - -  Foot Form Completion - Done Done        Hyperlipidemia LDL goal <100 Hyperlipidemia:Low fat diet discussed and encouraged.   Lipid Panel  Lab Results  Component Value Date   CHOL 162 01/31/2021   HDL 40 (L) 01/31/2021   LDLCALC 77 01/31/2021   TRIG 227 (H) 01/31/2021   CHOLHDL 4.1 01/31/2021   Needs to lower fat intake and increase activity    Cervical spondylosis with radiculopathy mainatained on tramadol by neurosurgery, advised her to contact regualr  prescriber for med refill

## 2021-02-19 NOTE — Telephone Encounter (Signed)
No need to call , entered in error

## 2021-02-19 NOTE — Assessment & Plan Note (Signed)
mainatained on tramadol by neurosurgery, advised her to contact regualr prescriber for med refill

## 2021-02-20 ENCOUNTER — Other Ambulatory Visit (HOSPITAL_COMMUNITY): Payer: Self-pay | Admitting: Physician Assistant

## 2021-02-20 ENCOUNTER — Other Ambulatory Visit (HOSPITAL_COMMUNITY): Payer: Self-pay | Admitting: *Deleted

## 2021-02-20 DIAGNOSIS — C9000 Multiple myeloma not having achieved remission: Secondary | ICD-10-CM

## 2021-02-20 DIAGNOSIS — D472 Monoclonal gammopathy: Secondary | ICD-10-CM

## 2021-02-20 NOTE — Progress Notes (Signed)
Patient called.  Patient aware.  Has an ultrasound scheduled for tomorrow and will do it while she is here.

## 2021-02-20 NOTE — Progress Notes (Signed)
Patient called.  Unable to reach patient.  No ability to leave a message.  Will continue to try and reach her.

## 2021-02-20 NOTE — Progress Notes (Signed)
Opened in error

## 2021-02-21 ENCOUNTER — Ambulatory Visit (HOSPITAL_COMMUNITY)
Admission: RE | Admit: 2021-02-21 | Discharge: 2021-02-21 | Disposition: A | Discharge status: Home / Self Care | Payer: Medicare Other | Source: Ambulatory Visit | Attending: Physician Assistant | Admitting: Physician Assistant

## 2021-02-21 ENCOUNTER — Ambulatory Visit (HOSPITAL_COMMUNITY)
Admission: RE | Admit: 2021-02-21 | Discharge: 2021-02-21 | Disposition: A | Discharge status: Home / Self Care | Payer: Medicare Other | Source: Ambulatory Visit | Attending: Urology | Admitting: Urology

## 2021-02-21 ENCOUNTER — Other Ambulatory Visit: Payer: Self-pay

## 2021-02-21 DIAGNOSIS — D472 Monoclonal gammopathy: Secondary | ICD-10-CM

## 2021-02-21 DIAGNOSIS — C9000 Multiple myeloma not having achieved remission: Secondary | ICD-10-CM | POA: Insufficient documentation

## 2021-02-21 DIAGNOSIS — M898X1 Other specified disorders of bone, shoulder: Secondary | ICD-10-CM | POA: Diagnosis not present

## 2021-02-21 DIAGNOSIS — M7591 Shoulder lesion, unspecified, right shoulder: Secondary | ICD-10-CM | POA: Diagnosis not present

## 2021-02-21 DIAGNOSIS — M19011 Primary osteoarthritis, right shoulder: Secondary | ICD-10-CM | POA: Diagnosis not present

## 2021-02-21 DIAGNOSIS — N1330 Unspecified hydronephrosis: Secondary | ICD-10-CM | POA: Diagnosis not present

## 2021-02-21 DIAGNOSIS — N201 Calculus of ureter: Secondary | ICD-10-CM | POA: Insufficient documentation

## 2021-02-28 ENCOUNTER — Other Ambulatory Visit: Payer: Self-pay

## 2021-02-28 ENCOUNTER — Ambulatory Visit (INDEPENDENT_AMBULATORY_CARE_PROVIDER_SITE_OTHER): Payer: Medicare Other | Admitting: Urology

## 2021-02-28 ENCOUNTER — Encounter: Payer: Self-pay | Admitting: Urology

## 2021-02-28 VITALS — BP 105/70 | HR 116 | Ht 67.0 in | Wt 190.0 lb

## 2021-02-28 DIAGNOSIS — N201 Calculus of ureter: Secondary | ICD-10-CM

## 2021-02-28 NOTE — Progress Notes (Signed)
Pt is prepped for and in and out catherization. Patient was cleaned and prepped in a sterle fashion with betadine. A 14 fr catheter foley was inserted. Urine return was note 60 ml.  Performed by [facility] LPN

## 2021-02-28 NOTE — Progress Notes (Signed)
History of Present Illness: She presents for follow-up.  History of recurrent urolithiasis of the left kidney.  She underwent cystoscopy, retrograde and left ureteroscopy with holmium laser and extraction of left renal calculi in May, 2022.  Because of recurrent infections it took a while to have her come back for stent extraction which was done about a month and a half ago.  She has had no left flank pain, gross hematuria or dysuria since that time.     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 9     History of adenomatous polyp of colon    2008   History of kidney stones    Hypercalcemia    Hypercalcemia 11/30/2010   Hyperlipidemia    Hypertension    Lactose intolerance in adult 02/01/2016   Left ureteral stone    Lipoma of arm 01/19/2015   Lumbar disc disease with radiculopathy    Nephrolithiasis    Neuropathic pain of both feet 04/11/2016   Neuropathy, peripheral    Nocturnal hypoxia 02/04/2014   Study confirms this done 02/10/2014    OSA treated with BiPAP    per study 2007   RENAL CALCULUS 03/15/2009   Qualifier: Diagnosis of  By: Zeb Comfort     Sciatica    Shoulder pain, bilateral    Sigmoid diverticulosis    TIA (transient ischemic attack) 02/26/2016   Type 2 diabetes mellitus ([place])    Vaginal dryness, menopausal 10/14/2016    Past Surgical History:  Procedure Laterality Date   BIOPSY N/A 03/17/2013   Procedure: GASTRIC BIOPSIES;  Surgeon: Danie Binder, MD;  Location: [facility];  Service: Endoscopy;  Laterality: N/A;   BIOPSY  06/10/2015   Procedure: BIOPSY;  Surgeon: Danie Binder, MD;  [facility];  Service: Endoscopy;;   gastric biopsy   CARDIAC CATHETERIZATION  05-11-2003  dr Shelva Majestic ([facility])   Abnormal cardiolite/   Normal coronary arteries and normal LVF,  ef  63%   CARDIOVASCULAR STRESS TEST  01-01-2014   normal lexiscan cardiolite/  no ischemia/ infarct/  normal LV function and wall motion ,  ef 81%   COLONOSCOPY  last one 10/02/2011   [facility] [place] TICS, IH, NEXT TCS APR 2018   COLONOSCOPY WITH PROPOFOL N/A 02/26/2017   Procedure: COLONOSCOPY WITH PROPOFOL;  Surgeon: Danie Binder, MD;  [facility];  Service: Endoscopy;  Laterality: N/A;  11:00am   COLONOSCOPY WITH PROPOFOL N/A 09/22/2019   Procedure: COLONOSCOPY WITH PROPOFOL;  Surgeon: Danie Binder, MD;  [facility];  Service: Endoscopy;  Laterality: N/A;  1:15   CYSTO/   RIGHT URETEROSOCOPY LASER LITHOTRIPSY STONE EXTRACTION  10-13-2004   CYSTO/  RIGHT RETROGRADE PYELOGRAM/  PLACMENT RIGHT URETERAL STENT  01-10-2010   CYSTOSCOPY W/ URETERAL STENT PLACEMENT Right 08/19/2015   Procedure: CYSTOSCOPY WITH RETROGRADE PYELOGRAM/URETERAL STENT PLACEMENT;  Surgeon: Franchot Gallo, MD;  [facility];  Service: Urology;  Laterality: Right;   CYSTOSCOPY W/ URETERAL STENT PLACEMENT Left 02/23/2020   Procedure: CYSTOSCOPY  LEFT  RETROGRADE PYELOGRAM LEFT URETEROSCOPY URETERAL STENT PLACEMENT;  Surgeon: Franchot Gallo, MD;  [facility];  Service: Urology;  Laterality: Left;   CYSTOSCOPY WITH RETROGRADE PYELOGRAM, URETEROSCOPY AND STENT PLACEMENT Left 10/21/2014   Procedure: CYSTOSCOPY WITH RETROGRADE PYELOGRAM, URETEROSCOPY AND STENT PLACEMENT;  Surgeon: Franchot Gallo, MD;  Location[facility];  Service: Urology;  Laterality: Left;   CYSTOSCOPY WITH RETROGRADE PYELOGRAM, URETEROSCOPY AND STENT PLACEMENT Right 11/22/2015   Procedure: CYSTOSCOPY, RIGHT URETERAL STENT REMOVAL; RIGHT RETROGRADE PYELOGRAM, RIGHT URETEROSCOPY WITH BALLOON DILATION; RIGHT URETERAL STENT PLACEMENT;  Surgeon: Franchot Gallo, MD;  [facility];  Service: Urology;  Laterality: Right;   CYSTOSCOPY WITH RETROGRADE PYELOGRAM, URETEROSCOPY AND STENT PLACEMENT Left 10/18/2020   Procedure: CYSTOSCOPY WITH LEFT RETROGRADE PYELOGRAM, LEFT URETEROSCOPY  WITH LASER AND LEFT URETERAL STENT PLACEMENT;  Surgeon: Franchot Gallo, MD;  [facility];  Service: Urology;  Laterality: Left;   CYSTOSCOPY/RETROGRADE/URETEROSCOPY/STONE EXTRACTION WITH BASKET Bilateral 08/02/2015   Procedure: CYSTOSCOPY; BILATERAL RETROGRADE PYELOGRAMS; BILATERAL URETEROSCOPY, STONE EXTRACTION WITH BASKET;  Surgeon: Franchot Gallo, MD;  [facility];  Service: Urology;  Laterality: Bilateral;   CYSTOSCOPY/RETROGRADE/URETEROSCOPY/STONE EXTRACTION WITH BASKET Right 06/28/2015   Procedure: CYSTOSCOPY/RETROGRADE/URETEROSCOPY/STONE EXTRACTION WITH BASKET, RIGHT URETERAL DOUBLE J STENT PLACEMENT;  Surgeon: Franchot Gallo, MD;  [facility];  Service: Urology;  Laterality: Right;   ESOPHAGOGASTRODUODENOSCOPY (EGD) WITH PROPOFOL N/A 03/17/2013   Procedure: ESOPHAGOGASTRODUODENOSCOPY (EGD) WITH PROPOFOL;  Surgeon: Danie Binder, MD;  [facility];  Service: Endoscopy;  Laterality: N/A;   ESOPHAGOGASTRODUODENOSCOPY (EGD) WITH PROPOFOL N/A 06/10/2015   Distal gastritis, distal esophageal stricture s/p dilation   ESOPHAGOGASTRODUODENOSCOPY (EGD) WITH PROPOFOL N/A 02/26/2017   Procedure: ESOPHAGOGASTRODUODENOSCOPY (EGD) WITH PROPOFOL;  Surgeon: Danie Binder, MD;  [facility];  Service: Endoscopy;  Laterality: N/A;   FLEXIBLE SIGMOIDOSCOPY  09/11/2011   HQP:[id number] Internal hemorrhoids   HOLMIUM LASER APPLICATION Left 6/59/9357   Procedure: HOLMIUM LASER APPLICATION;  Surgeon: Franchot Gallo, MD;  [facility];  Service: Urology;  Laterality: Left;   HOLMIUM LASER APPLICATION Right 0/17/7939   Procedure: HOLMIUM LASER APPLICATION;  Surgeon: Franchot Gallo, MD;  [facility];  Service:  Urology;  Laterality: Right;   HOLMIUM LASER APPLICATION  0/30/0923   Procedure: HOLMIUM LASER APPLICATION;  Surgeon: Franchot Gallo, MD;  [facility];  Service: Urology;;   HOLMIUM LASER APPLICATION Left 3/00/7622   Procedure: HOLMIUM LASER APPLICATION;  Surgeon: Franchot Gallo, MD;  [facility];  Service: Urology;  Laterality: Left;   MASS EXCISION Left 03/04/2015   Procedure: EXCISION OF SOFT TISSUE NEOPLASM LEFT ARM;  Surgeon: Aviva Signs, MD;  Location[facility];  Service: General;  Laterality: Left;   PERCUTANEOUS NEPHROSTOLITHOTOMY Bilateral 12/  2011     [facility]   POLYPECTOMY N/A 03/17/2013   Procedure: GASTRIC POLYPECTOMY;  Surgeon: Danie Binder, MD;  [facility];  Service: Endoscopy;  Laterality: N/A;   POLYPECTOMY  02/26/2017   Procedure: POLYPECTOMY;  Surgeon: Danie Binder, MD;  [facility];  Service: Endoscopy;;  polyp at cecum, ascending colon polyps x3, hepatic flexure polyps x8, transverse colon polyps x8    POLYPECTOMY  09/22/2019   Procedure: POLYPECTOMY;  Surgeon: Danie Binder, MD;  [facility];  Service: Endoscopy;;   REMOVAL RIGHT THIGH CYST  2006   SAVORY DILATION N/A 03/17/2013   Procedure: SAVORY DILATION;  Surgeon: Danie Binder, MD;  [facility];  Service: Endoscopy;  Laterality: N/A;  #  12.8, 14, 15, 16 dilators used   SAVORY DILATION N/A 06/10/2015   Procedure: SAVORY DILATION;  Surgeon: Danie Binder, MD;  [facility];  Service: Endoscopy;  Laterality: N/A;   TRANSTHORACIC ECHOCARDIOGRAM  01-01-2014   mild LVH/  ef 84-66%/  grade I diastolic dysfunction/  trivial MR, TR, and PR   VAGINAL HYSTERECTOMY  1970's    Home Medications:  Allergies as of 02/28/2021       Reactions   Ace Inhibitors Cough   Patient doesn't recall   Keflex [cephalexin] Diarrhea, Nausea And Vomiting   Nitrofurantoin Diarrhea, Nausea And Vomiting   Penicillins Hives, Other (See Comments)   Reaction:  Blisters on  hands/feet  Has patient had a PCN reaction causing immediate rash, facial/tongue/throat swelling, SOB or lightheadedness with hypotension: No Has patient had a PCN reaction causing severe rash involving mucus membranes or skin necrosis: No Has patient had a PCN reaction that required hospitalization No Has patient had a PCN reaction occurring within the last 10 years: No If all of the above answers are "NO", then may proceed with Cephalosporin use.        Medication List        Accurate as of February 28, 2021 12:29 PM. If you have any questions, ask your nurse or doctor.          acetaminophen 500 MG tablet Commonly known as: TYLENOL Take 1,000 mg by mouth every 6 (six) hours as needed for mild pain.   amLODipine 10 MG tablet Commonly known as: NORVASC TAKE 1 TABLET BY MOUTH EVERY DAY   budesonide-formoterol 80-4.5 MCG/ACT inhaler Commonly known as: Symbicort INHALE 2 PUFFS INTO THE LUNGS TWICE A DAY What changed:  how much to take how to take this when to take this additional instructions   diclofenac Sodium 1 % Gel Commonly known as: VOLTAREN Apply 4 g topically 4 (four) times daily as needed (pain).   diphenhydramine-acetaminophen 25-500 MG Tabs tablet Commonly known as: TYLENOL PM Take 2 tablets by mouth at bedtime.   gabapentin 300 MG capsule Commonly known as: NEURONTIN Take 300 mg by mouth daily.   OneTouch Delica Lancets 59D Misc Once daily testing dx e11.9   OneTouch Ultra test strip Generic drug: glucose blood Use as instructed once daily dx e11.9   pantoprazole 40 MG tablet Commonly known as: PROTONIX TAKE 1 TABLET BY MOUTH TWICE A DAY   PARoxetine 40 MG tablet Commonly known as: PAXIL TAKE 1 TABLET BY MOUTH EVERY DAY IN THE MORNING   rosuvastatin 20 MG tablet Commonly known as: CRESTOR TAKE 1 TABLET BY MOUTH EVERY DAY   Tradjenta 5 MG Tabs tablet Generic drug: linagliptin TAKE 1 TABLET BY MOUTH EVERY DAY   traMADol 50 MG  tablet Commonly known as: ULTRAM Take one tablet by mouth at bedtime for chronic back pain        Allergies:  Allergies  Allergen Reactions   Ace Inhibitors Cough    Patient doesn't recall   Keflex [Cephalexin] Diarrhea and Nausea And Vomiting   Nitrofurantoin Diarrhea and Nausea And Vomiting   Penicillins Hives and Other (See Comments)    Reaction:  Blisters on hands/feet  Has patient had a PCN reaction causing immediate rash, facial/tongue/throat swelling, SOB or lightheadedness with hypotension: No Has patient had a PCN reaction causing severe rash involving mucus membranes or skin necrosis: No Has patient had a PCN reaction that required hospitalization No Has patient had a PCN reaction occurring within the  last 10 years: No If all of the above answers are "NO", then may proceed with Cephalosporin use.    Family History  Problem Relation Age of Onset   Hypertension Mother    Diabetes Mother    Heart failure Mother    Dementia Mother    Emphysema Father    Hypertension Father    Diabetes Brother    GER disease Brother    Hypertension Sister    Hypertension Sister    Cancer Other        family history    Diabetes Other        family history    Heart defect Other        famiily history    Arthritis Other        family history    Anesthesia problems Neg Hx    Hypotension Neg Hx    Malignant hyperthermia Neg Hx    Pseudochol deficiency Neg Hx    Colon cancer Neg Hx     Social History:  reports that she quit smoking about 32 years ago. Her smoking use included cigarettes. She started smoking about 46 years ago. She has a 20.00 pack-year smoking history. She has never used smokeless tobacco. She reports that she does not drink alcohol and does not use drugs.  ROS: A complete review of systems was performed.  All systems are negative except for pertinent findings as noted.  Physical Exam:  Vital signs in last 24 hours: There were no vitals taken for this  visit. Constitutional:  Alert and oriented, No acute distress Cardiovascular: Regular rate  Respiratory: Normal respiratory effort GI: Abdomen is soft, nontender, nondistended, no abdominal masses. No CVAT.  Genitourinary: Normal female phallus, testes are descended bilaterally and non-tender and without masses, scrotum is normal in appearance without lesions or masses, perineum is normal on inspection. Lymphatic: No lymphadenopathy Neurologic: Grossly intact, no focal deficits Psychiatric: Normal mood and affect  I have reviewed prior pt notes  I have reviewed notes from referring/previous physicians  I have reviewed urinalysis results  I have independently reviewed prior imaging--renal U/S    Impression/Assessment:  History of left renal calculi, status post recent ureteroscopy with moderate, probably long-term persistent hydronephrosis with a ureteral jet on the left.  She is asymptomatic and has clear urine and no evident stones on ultrasound  Plan:  I will see her back in 3 months with KUB

## 2021-03-01 LAB — URINALYSIS, ROUTINE W REFLEX MICROSCOPIC
Bilirubin, UA: NEGATIVE
Glucose, UA: NEGATIVE
Ketones, UA: NEGATIVE
Leukocytes,UA: NEGATIVE
Nitrite, UA: NEGATIVE
RBC, UA: NEGATIVE
Specific Gravity, UA: 1.02 (ref 1.005–1.030)
Urobilinogen, Ur: 0.2 mg/dL (ref 0.2–1.0)
pH, UA: 6 (ref 5.0–7.5)

## 2021-03-01 LAB — MICROSCOPIC EXAMINATION
Bacteria, UA: NONE SEEN
RBC, Urine: NONE SEEN /hpf (ref 0–2)
WBC, UA: NONE SEEN /hpf (ref 0–5)

## 2021-03-06 ENCOUNTER — Encounter: Payer: Self-pay | Admitting: Orthopedic Surgery

## 2021-03-06 ENCOUNTER — Ambulatory Visit (INDEPENDENT_AMBULATORY_CARE_PROVIDER_SITE_OTHER): Payer: Medicare Other | Admitting: Orthopedic Surgery

## 2021-03-06 ENCOUNTER — Other Ambulatory Visit: Payer: Self-pay

## 2021-03-06 VITALS — BP 130/78 | HR 97 | Ht 66.5 in | Wt 193.6 lb

## 2021-03-06 DIAGNOSIS — M25512 Pain in left shoulder: Secondary | ICD-10-CM | POA: Diagnosis not present

## 2021-03-06 DIAGNOSIS — G8929 Other chronic pain: Secondary | ICD-10-CM | POA: Diagnosis not present

## 2021-03-06 MED ORDER — TRAMADOL HCL 50 MG PO TABS: 50.0000 mg | ORAL_TABLET | Freq: Four times a day (QID) | ORAL | 5 refills | Status: DC | PRN

## 2021-03-06 NOTE — Progress Notes (Signed)
Chief Complaint  Patient presents with   Results    MRI Left shoulder XRay- Right scapula   Cheryl Abbott continues to have pain in her shoulder its difficult to quantify.  She says it hurts when it rains.  She has been using some topical medication Tylenol PM and gabapentin with fairly good success  She had a right scapular x-ray which showed 2 lesions in the scapula she has a history of myeloma I have connected with oncology to remind them to follow-up on that   Left shoulder Active forward elevation is 150 degrees External rotation is normal  I have reviewed the MRI images and my impression is that this is a case of tendinosis without surgical tear there is also a fair amount of glenohumeral arthritis and some bursitis  MRI report IMPRESSION: 1. Moderate rotator cuff tendinopathy/tendinosis with interstitial tears and areas of bursal and articular surface fraying and fibrillation. No full-thickness retracted tear. 2. Intact long head biceps tendon and grossly normal glenoid labrum. 3. Moderate glenohumeral joint degenerative changes. 4. Mild subacromial/subdeltoid bursitis.     Electronically Signed   By: Marijo Sanes M.D.   On: 02/18/2021 10:06   Meds ordered this encounter  Medications   traMADol (ULTRAM) 50 MG tablet    Sig: Take 1 tablet (50 mg total) by mouth every 6 (six) hours as needed.    Dispense:  60 tablet    Refill:  5     I offered physical therapy she opted for home exercises so we put her on a [employer] exercise program  Follow-up as needed no surgery indicated

## 2021-03-08 ENCOUNTER — Other Ambulatory Visit: Payer: Self-pay

## 2021-03-08 ENCOUNTER — Ambulatory Visit: Payer: Medicare Other

## 2021-03-08 ENCOUNTER — Encounter (INDEPENDENT_AMBULATORY_CARE_PROVIDER_SITE_OTHER): Payer: Self-pay

## 2021-03-08 LAB — HM DIABETES EYE EXAM

## 2021-03-19 DIAGNOSIS — R0902 Hypoxemia: Secondary | ICD-10-CM | POA: Diagnosis not present

## 2021-03-23 ENCOUNTER — Ambulatory Visit (INDEPENDENT_AMBULATORY_CARE_PROVIDER_SITE_OTHER): Payer: Medicare Other | Admitting: Family Medicine

## 2021-03-23 ENCOUNTER — Other Ambulatory Visit: Payer: Self-pay

## 2021-03-23 ENCOUNTER — Encounter: Payer: Self-pay | Admitting: Family Medicine

## 2021-03-23 VITALS — BP 130/82 | HR 96 | Temp 98.4°F | Resp 16 | Ht 67.0 in | Wt 197.0 lb

## 2021-03-23 DIAGNOSIS — E669 Obesity, unspecified: Secondary | ICD-10-CM

## 2021-03-23 DIAGNOSIS — J209 Acute bronchitis, unspecified: Secondary | ICD-10-CM

## 2021-03-23 DIAGNOSIS — E1121 Type 2 diabetes mellitus with diabetic nephropathy: Secondary | ICD-10-CM

## 2021-03-23 DIAGNOSIS — I10 Essential (primary) hypertension: Secondary | ICD-10-CM | POA: Diagnosis not present

## 2021-03-23 DIAGNOSIS — F411 Generalized anxiety disorder: Secondary | ICD-10-CM

## 2021-03-23 DIAGNOSIS — Z1231 Encounter for screening mammogram for malignant neoplasm of breast: Secondary | ICD-10-CM | POA: Diagnosis not present

## 2021-03-23 DIAGNOSIS — R0981 Nasal congestion: Secondary | ICD-10-CM | POA: Diagnosis not present

## 2021-03-23 MED ORDER — BENZONATATE 100 MG PO CAPS: 100.0000 mg | ORAL_CAPSULE | Freq: Two times a day (BID) | ORAL | 0 refills | Status: DC | PRN

## 2021-03-23 MED ORDER — AZITHROMYCIN 250 MG PO TABS: ORAL_TABLET | ORAL | 0 refills | Status: AC

## 2021-03-23 NOTE — Progress Notes (Signed)
Cheryl Abbott     MRN: [id number]      DOB: 10-10-1945   HPI Ms. Adkison is here for follow up and re-evaluation of chronic medical conditions, medication management and review of any available recent lab and radiology data.  Preventive health is updated, specifically  Cancer screening and Immunization.   Questions or concerns regarding consultations or procedures which the PT has had in the interim are  addressed. The PT denies any adverse reactions to current medications since the last visit.  There are no new concerns.  There are no specific complaints   ROS Denies recent fever or chills. Denies chest pains, palpitations and leg swelling Denies abdominal pain, nausea, vomiting,diarrhea or constipation.   Denies dysuria, frequency, hesitancy or incontinence. Denies joint pain, swelling and limitation in mobility. Denies headaches, seizures, numbness, or tingling. Denies depression, anxiety or insomnia. Denies skin break down or rash. Denies polyuria, polydipsia, blurred vision , or hypoglycemic episodes.    PE  BP 130/82   Pulse 96   Temp 98.4 F (36.9 C) (Oral)   Resp 16   Ht 5\' 7"  (1.702 m)   Wt 197 lb (89.4 kg)   SpO2 93%   BMI 30.85 kg/m   Patient alert and oriented and in no cardiopulmonary distress.  HEENT: No facial asymmetry, EOMI,     Neck supple .Nasal congestion, no sinus tenderness, TM clear  Chest: decreased air entry, scattered crackles , no wheezes.  CVS: S1, S2 no murmurs, no S3.Regular rate.  ABD: Soft non tender.   Ext: No edema  MS: Adequate ROM spine, shoulders, hips and knees.  Skin: Intact, no ulcerations or rash noted.  Psych: Good eye contact, normal affect. Memory intact not anxious or depressed appearing.  CNS: CN 2-12 intact, power,  normal throughout.no focal deficits noted.   Assessment & Plan  Acute bronchitis Tessalon perle and Z pack prescibed  Nasal congestion Tessalon Perle and z pack prescribed  Obesity (BMI  30.0-34.9)  Patient re-educated about  the importance of commitment to a  minimum of 150 minutes of exercise per week as able.  The importance of healthy food choices with portion control discussed, as well as eating regularly and within a 12 hour window most days. The need to choose "clean , green" food 50 to 75% of the time is discussed, as well as to make water the primary drink and set a goal of 64 ounces water daily.    Weight /BMI 03/23/2021 03/06/2021 02/28/2021  WEIGHT 197 lb 193 lb 9.6 oz 190 lb  HEIGHT 5\' 7"  5' 6.5" 5\' 7"   BMI 30.85 kg/m2 30.78 kg/m2 29.76 kg/m2      Hypertension goal BP (blood pressure) < 130/80 Controlled, no change in medication DASH diet and commitment to daily physical activity for a minimum of 30 minutes discussed and encouraged, as a part of hypertension management. The importance of attaining a healthy weight is also discussed.  BP/Weight 03/23/2021 03/06/2021 02/28/2021 02/17/2021 02/09/2021 5/42/7062 08/15/6281  Systolic BP 151 761 607 371 - 062 694  Diastolic BP 82 78 70 66 - 74 64  Wt. (Lbs) 197 193.6 190 194 195 195 -  BMI 30.85 30.78 29.76 30.38 31 31 -       Generalized anxiety disorder Controlled, no change in medication   Type 2 diabetes with nephropathy [facility] [place]) Ms. Shorts is reminded of the importance of commitment to daily physical activity for 30 minutes or more, as able and the need to limit  carbohydrate intake to 30 to 60 grams per meal to help with blood sugar control.   The need to take medication as prescribed, test blood sugar as directed, and to call between visits if there is a concern that blood sugar is uncontrolled is also discussed.   Ms. Alviar is reminded of the importance of daily foot exam, annual eye examination, and good blood sugar, blood pressure and cholesterol control. Controlled, no change in medication   Diabetic Labs Latest Ref Rng & Units 02/01/2021 01/31/2021 11/29/2020 10/25/2020 10/05/2020  HbA1c 4.8 - 5.6 % -  7.0(H) - - 6.4(H)  Microalbumin Not Estab. ug/mL 70.9(H) - - - -  Micro/Creat Ratio 0 - 29 mg/g creat 43(H) - - - -  Chol 0 - 200 mg/dL - 162 - - 156  HDL >40 mg/dL - 40(L) - - 45  Calc LDL 0 - 99 mg/dL - 77 - - 83  Triglycerides <150 mg/dL - 227(H) - - 165(H)  Creatinine 0.44 - 1.00 mg/dL - 1.77(H) 2.13(H) 1.92(H) 2.34(H)   BP/Weight 03/23/2021 03/06/2021 02/28/2021 02/17/2021 02/09/2021 3/32/9518 01/13/1659  Systolic BP 630 160 109 323 - 557 322  Diastolic BP 82 78 70 66 - 74 64  Wt. (Lbs) 197 193.6 190 194 195 195 -  BMI 30.85 30.78 29.76 30.38 31 31 -   Foot/eye exam completion dates Latest Ref Rng & Units 02/17/2021 01/07/2020  Eye Exam No Retinopathy - -  Foot exam Order - - -  Foot Form Completion - Done Done

## 2021-03-23 NOTE — Patient Instructions (Addendum)
Annual exam in office with MD past due , please schedule for end January  You are treated for acute bronchitis and nasal congestion, azithromycin and tessalon perles are prescribed  Thanks for choosing [facility], we consider it a privelige to serve you.  Please schedule December mammogram at checkout  Fasting lipid, cmp an deGFr and HBa1C 1 week before  Jan  appointment  Thanks for choosing [facility], we consider it a privelige to serve you.

## 2021-03-27 ENCOUNTER — Encounter: Payer: Self-pay | Admitting: Family Medicine

## 2021-03-27 DIAGNOSIS — R0981 Nasal congestion: Secondary | ICD-10-CM | POA: Insufficient documentation

## 2021-03-27 NOTE — Assessment & Plan Note (Signed)
Cheryl Abbott is reminded of the importance of commitment to daily physical activity for 30 minutes or more, as able and the need to limit carbohydrate intake to 30 to 60 grams per meal to help with blood sugar control.   The need to take medication as prescribed, test blood sugar as directed, and to call between visits if there is a concern that blood sugar is uncontrolled is also discussed.   Cheryl Abbott is reminded of the importance of daily foot exam, annual eye examination, and good blood sugar, blood pressure and cholesterol control. Controlled, no change in medication   Diabetic Labs Latest Ref Rng & Units 02/01/2021 01/31/2021 11/29/2020 10/25/2020 10/05/2020  HbA1c 4.8 - 5.6 % - 7.0(H) - - 6.4(H)  Microalbumin Not Estab. ug/mL 70.9(H) - - - -  Micro/Creat Ratio 0 - 29 mg/g creat 43(H) - - - -  Chol 0 - 200 mg/dL - 162 - - 156  HDL >40 mg/dL - 40(L) - - 45  Calc LDL 0 - 99 mg/dL - 77 - - 83  Triglycerides <150 mg/dL - 227(H) - - 165(H)  Creatinine 0.44 - 1.00 mg/dL - 1.77(H) 2.13(H) 1.92(H) 2.34(H)   BP/Weight 03/23/2021 03/06/2021 02/28/2021 02/17/2021 02/09/2021 8/61/6837 07/20/209  Systolic BP 155 208 022 336 - 122 449  Diastolic BP 82 78 70 66 - 74 64  Wt. (Lbs) 197 193.6 190 194 195 195 -  BMI 30.85 30.78 29.76 30.38 31 31 -   Foot/eye exam completion dates Latest Ref Rng & Units 02/17/2021 01/07/2020  Eye Exam No Retinopathy - -  Foot exam Order - - -  Foot Form Completion - Done Done

## 2021-03-27 NOTE — Assessment & Plan Note (Signed)
Controlled, no change in medication  

## 2021-03-27 NOTE — Assessment & Plan Note (Signed)
Tessalon Perle and z pack prescribed

## 2021-03-27 NOTE — Assessment & Plan Note (Signed)
Controlled, no change in medication DASH diet and commitment to daily physical activity for a minimum of 30 minutes discussed and encouraged, as a part of hypertension management. The importance of attaining a healthy weight is also discussed.  BP/Weight 03/23/2021 03/06/2021 02/28/2021 02/17/2021 02/09/2021 3/38/8266 11/15/4859  Systolic BP 612 240 018 097 - 044 925  Diastolic BP 82 78 70 66 - 74 64  Wt. (Lbs) 197 193.6 190 194 195 195 -  BMI 30.85 30.78 29.76 30.38 31 31 -

## 2021-03-27 NOTE — Assessment & Plan Note (Signed)
  Patient re-educated about  the importance of commitment to a  minimum of 150 minutes of exercise per week as able.  The importance of healthy food choices with portion control discussed, as well as eating regularly and within a 12 hour window most days. The need to choose "clean , green" food 50 to 75% of the time is discussed, as well as to make water the primary drink and set a goal of 64 ounces water daily.    Weight /BMI 03/23/2021 03/06/2021 02/28/2021  WEIGHT 197 lb 193 lb 9.6 oz 190 lb  HEIGHT 5\' 7"  5' 6.5" 5\' 7"   BMI 30.85 kg/m2 30.78 kg/m2 29.76 kg/m2

## 2021-03-27 NOTE — Assessment & Plan Note (Signed)
Tessalon perle and Z pack prescibed

## 2021-04-17 ENCOUNTER — Other Ambulatory Visit: Payer: Self-pay

## 2021-04-17 ENCOUNTER — Ambulatory Visit (INDEPENDENT_AMBULATORY_CARE_PROVIDER_SITE_OTHER): Payer: Medicare Other | Admitting: *Deleted

## 2021-04-17 DIAGNOSIS — Z0000 Encounter for general adult medical examination without abnormal findings: Secondary | ICD-10-CM

## 2021-04-17 NOTE — Progress Notes (Signed)
Subjective:   Cheryl Abbott is a 75 y.o. female who presents for Medicare Annual (Subsequent) preventive examination. I connected with  THOMASENE DUBOW on 04/17/21 by audio enabled telemedicine application and verified that I am speaking with the correct person using two identifiers.   I discussed the limitations of evaluation and management by telemedicine. The patient expressed understanding and agreed to proceed.   Review of Systems           Objective:    There were no vitals filed for this visit. There is no height or weight on file to calculate BMI.  Advanced Directives 02/09/2021 11/01/2020 10/14/2020 09/22/2020 09/15/2020 09/12/2020 09/09/2020  Does Patient Have a Medical Advance Directive? No Yes No Yes Yes Yes No  Type of Advance Directive - Living will;Healthcare Power of [place]  Does patient want to make changes to medical advance directive? - No - Patient declined No - Patient declined No - Patient declined - No - Patient declined -  Copy of Healthcare Power of Attorney in Chart? - - - No - copy requested No - copy requested No - copy requested No - copy requested  Would patient like information on creating a medical advance directive? No - Patient declined No - Patient declined No - Patient declined No - Patient declined No - Patient declined No - Patient declined No - Patient declined  Pre-existing out of facility DNR order (yellow form or pink MOST form) - - - - - - -    Current Medications (verified) Outpatient Encounter Medications as of 04/17/2021  Medication Sig   acetaminophen (TYLENOL) 500 MG tablet Take 1,000 mg by mouth every 6 (six) hours as needed for mild pain.   amLODipine (NORVASC) 10 MG tablet TAKE 1 TABLET BY MOUTH EVERY DAY   benzonatate (TESSALON) 100 MG capsule Take 1 capsule (100 mg total) by mouth 2 (two) times daily as needed for  cough.   budesonide-formoterol (SYMBICORT) 80-4.5 MCG/ACT inhaler INHALE 2 PUFFS INTO THE LUNGS TWICE A DAY (Patient taking differently: Inhale 2 puffs into the lungs 2 (two) times daily.)   diclofenac Sodium (VOLTAREN) 1 % GEL Apply 4 g topically 4 (four) times daily as needed (pain).   diphenhydramine-acetaminophen (TYLENOL PM) 25-500 MG TABS tablet Take 2 tablets by mouth at bedtime.   gabapentin (NEURONTIN) 300 MG capsule Take 300 mg by mouth daily.   glucose blood (ONETOUCH ULTRA) test strip Use as instructed once daily dx I43.3   OneTouch Delica Lancets 29J MISC Once daily testing dx e11.9   pantoprazole (PROTONIX) 40 MG tablet TAKE 1 TABLET BY MOUTH TWICE A DAY   PARoxetine (PAXIL) 40 MG tablet TAKE 1 TABLET BY MOUTH EVERY DAY IN THE MORNING   rosuvastatin (CRESTOR) 20 MG tablet TAKE 1 TABLET BY MOUTH EVERY DAY   TRADJENTA 5 MG TABS tablet TAKE 1 TABLET BY MOUTH EVERY DAY   traMADol (ULTRAM) 50 MG tablet Take 1 tablet (50 mg total) by mouth every 6 (six) hours as needed.   No facility-administered encounter medications on file as of 04/17/2021.    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 9     History of adenomatous polyp of colon    2008   History of kidney stones    Hypercalcemia    Hypercalcemia 11/30/2010   Hyperlipidemia    Hypertension    Lactose intolerance in adult 02/01/2016   Left ureteral stone    Lipoma of arm 01/19/2015   Lumbar disc disease with radiculopathy    Nephrolithiasis    Neuropathic pain of both feet 04/11/2016    Neuropathy, peripheral    Nocturnal hypoxia 02/04/2014   Study confirms this done 02/10/2014    OSA treated with BiPAP    per study 2007   RENAL CALCULUS 03/15/2009   Qualifier: Diagnosis of  By: Zeb Comfort     Sciatica    Shoulder pain, bilateral    Sigmoid diverticulosis    TIA (transient ischemic attack) 02/26/2016   Type 2 diabetes mellitus ([place])    Vaginal dryness, menopausal 10/14/2016   Past Surgical History:  Procedure Laterality Date   BIOPSY N/A 03/17/2013   Procedure: GASTRIC BIOPSIES;  Surgeon: Danie Binder, MD;  Location: [facility];  Service: Endoscopy;  Laterality: N/A;   BIOPSY  06/10/2015   Procedure: BIOPSY;  Surgeon: Danie Binder, MD;  [facility];  Service: Endoscopy;;  gastric biopsy   CARDIAC CATHETERIZATION  05-11-2003  dr Shelva Majestic ([facility])   Abnormal cardiolite/   Normal coronary arteries and normal LVF,  ef  63%   CARDIOVASCULAR STRESS TEST  01-01-2014   normal lexiscan cardiolite/  no ischemia/ infarct/  normal LV function and wall motion ,  ef 81%   COLONOSCOPY  last one 10/02/2011   MOD [place] TICS, IH, NEXT TCS APR 2018   COLONOSCOPY WITH PROPOFOL N/A 02/26/2017   Procedure: COLONOSCOPY WITH PROPOFOL;  Surgeon: Danie Binder, MD;  [facility];  Service: Endoscopy;  Laterality: N/A;  11:00am   COLONOSCOPY WITH PROPOFOL N/A 09/22/2019   Procedure: COLONOSCOPY WITH PROPOFOL;  Surgeon: Danie Binder, MD;  [facility];  Service: Endoscopy;  Laterality: N/A;  1:15   CYSTO/   RIGHT URETEROSOCOPY LASER LITHOTRIPSY STONE EXTRACTION  10-13-2004   CYSTO/  RIGHT RETROGRADE PYELOGRAM/  PLACMENT RIGHT URETERAL STENT  01-10-2010   CYSTOSCOPY W/ URETERAL STENT PLACEMENT Right 08/19/2015   Procedure: CYSTOSCOPY WITH RETROGRADE PYELOGRAM/URETERAL STENT PLACEMENT;  Surgeon: Franchot Gallo, MD;  [facility]: [facility];  Service: Urology;  Laterality: Right;   CYSTOSCOPY W/ URETERAL STENT PLACEMENT Left 02/23/2020   Procedure:  CYSTOSCOPY LEFT  RETROGRADE PYELOGRAM LEFT URETEROSCOPY URETERAL STENT PLACEMENT;  Surgeon: Franchot Gallo, MD;  [facility];  Service: Urology;  Laterality: Left;   CYSTOSCOPY WITH RETROGRADE PYELOGRAM, URETEROSCOPY AND STENT PLACEMENT Left 10/21/2014   Procedure: CYSTOSCOPY WITH RETROGRADE PYELOGRAM, URETEROSCOPY AND STENT PLACEMENT;  Surgeon: Franchot Gallo, MD;  Location[facility];  Service: Urology;  Laterality: Left;   CYSTOSCOPY WITH RETROGRADE PYELOGRAM, URETEROSCOPY AND STENT PLACEMENT Right 11/22/2015   Procedure: CYSTOSCOPY, RIGHT URETERAL STENT REMOVAL; RIGHT RETROGRADE PYELOGRAM, RIGHT URETEROSCOPY WITH BALLOON DILATION; RIGHT URETERAL STENT PLACEMENT;  Surgeon: Franchot Gallo, MD;  [facility];  Service: Urology;  Laterality: Right;   CYSTOSCOPY WITH RETROGRADE PYELOGRAM, URETEROSCOPY AND STENT PLACEMENT Left 10/18/2020  Procedure: CYSTOSCOPY WITH LEFT RETROGRADE PYELOGRAM, LEFT URETEROSCOPY  WITH LASER AND LEFT URETERAL STENT PLACEMENT;  Surgeon: Franchot Gallo, MD;  [facility];  Service: Urology;  Laterality: Left;   CYSTOSCOPY/RETROGRADE/URETEROSCOPY/STONE EXTRACTION WITH BASKET Bilateral 08/02/2015   Procedure: CYSTOSCOPY; BILATERAL RETROGRADE PYELOGRAMS; BILATERAL URETEROSCOPY, STONE EXTRACTION WITH BASKET;  Surgeon: Franchot Gallo, MD;  [facility];  Service: Urology;  Laterality: Bilateral;   CYSTOSCOPY/RETROGRADE/URETEROSCOPY/STONE EXTRACTION WITH BASKET Right 06/28/2015   Procedure: CYSTOSCOPY/RETROGRADE/URETEROSCOPY/STONE EXTRACTION WITH BASKET, RIGHT URETERAL DOUBLE J STENT PLACEMENT;  Surgeon: Franchot Gallo, MD;  [facility];  Service: Urology;  Laterality: Right;   ESOPHAGOGASTRODUODENOSCOPY (EGD) WITH PROPOFOL N/A 03/17/2013   Procedure: ESOPHAGOGASTRODUODENOSCOPY (EGD) WITH PROPOFOL;  Surgeon: Danie Binder, MD;  [facility];  Service: Endoscopy;  Laterality: N/A;   ESOPHAGOGASTRODUODENOSCOPY (EGD) WITH  PROPOFOL N/A 06/10/2015   Distal gastritis, distal esophageal stricture s/p dilation   ESOPHAGOGASTRODUODENOSCOPY (EGD) WITH PROPOFOL N/A 02/26/2017   Procedure: ESOPHAGOGASTRODUODENOSCOPY (EGD) WITH PROPOFOL;  Surgeon: Danie Binder, MD;  [facility];  Service: Endoscopy;  Laterality: N/A;   FLEXIBLE SIGMOIDOSCOPY  09/11/2011   KWI:OXBDZHGD Internal hemorrhoids   HOLMIUM LASER APPLICATION Left 03/04/2682   Procedure: HOLMIUM LASER APPLICATION;  Surgeon: Franchot Gallo, MD;  Location[facility];  Service: Urology;  Laterality: Left;   HOLMIUM LASER APPLICATION Right 09/27/6220   Procedure: HOLMIUM LASER APPLICATION;  Surgeon: Franchot Gallo, MD;  [facility];  Service: Urology;  Laterality: Right;   HOLMIUM LASER APPLICATION  9/79/8921   Procedure: HOLMIUM LASER APPLICATION;  Surgeon: Franchot Gallo, MD;  [facility];  Service: Urology;;   HOLMIUM LASER APPLICATION Left 1/94/1740   Procedure: HOLMIUM LASER APPLICATION;  Surgeon: Franchot Gallo, MD;  [facility];  Service: Urology;  Laterality: Left;   MASS EXCISION Left 03/04/2015   Procedure: EXCISION OF SOFT TISSUE NEOPLASM LEFT ARM;  Surgeon: Aviva Signs, MD;  Location: [facility];  Service: General;  Laterality: Left;   PERCUTANEOUS NEPHROSTOLITHOTOMY Bilateral 12/  2011     [facility]   POLYPECTOMY N/A 03/17/2013   Procedure: GASTRIC POLYPECTOMY;  Surgeon: Danie Binder, MD;  [facility];  Service: Endoscopy;  Laterality: N/A;   POLYPECTOMY  02/26/2017   Procedure: POLYPECTOMY;  Surgeon: Danie Binder, MD;  [facility];  Service: Endoscopy;;  polyp at cecum, ascending colon polyps x3, hepatic flexure polyps x8, transverse colon polyps x8    POLYPECTOMY  09/22/2019   Procedure: POLYPECTOMY;  Surgeon: Danie Binder, MD;  [facility];  Service: Endoscopy;;   REMOVAL RIGHT THIGH CYST  2006   SAVORY DILATION N/A 03/17/2013   Procedure: SAVORY DILATION;  Surgeon:  Danie Binder, MD;  [facility];  Service: Endoscopy;  Laterality: N/A;  #12.8, 14, 15, 16 dilators used   SAVORY DILATION N/A 06/10/2015   Procedure: SAVORY DILATION;  Surgeon: Danie Binder, MD;  [facility];  Service: Endoscopy;  Laterality: N/A;   TRANSTHORACIC ECHOCARDIOGRAM  01-01-2014   mild LVH/  ef 81-44%/  grade I diastolic dysfunction/  trivial MR, TR, and PR   VAGINAL HYSTERECTOMY  1970's   Family History  Problem Relation Age of Onset   Hypertension Mother    Diabetes Mother    Heart failure Mother    Dementia Mother    Emphysema Father    Hypertension Father    Diabetes Brother    GER disease Brother    Hypertension Sister    Hypertension  Sister    Cancer Other        family history    Diabetes Other        family history    Heart defect Other        famiily history    Arthritis Other        family history    Anesthesia problems Neg Hx    Hypotension Neg Hx    Malignant hyperthermia Neg Hx    Pseudochol deficiency Neg Hx    Colon cancer Neg Hx    Social History   Socioeconomic History   Marital status: Divorced    Spouse name: Not on file   Number of children: 2   Years of education: Not on file   Highest education level: Not on file  Occupational History   Occupation: retired   Tobacco Use   Smoking status: Former    Packs/day: 1.00    Years: 20.00    Pack years: 20.00    Types: Cigarettes    Start date: 09/21/1974    Quit date: 09/20/1988    Years since quitting: 32.5   Smokeless tobacco: Never   Tobacco comments:    Quit x 20 years  Vaping Use   Vaping Use: Never used  Substance and Sexual Activity   Alcohol use: No    Alcohol/week: 0.0 standard drinks   Drug use: No   Sexual activity: Never    Birth control/protection: Surgical  Other Topics Concern   Not on file  Social History Narrative   Patient is right handed   Patient drinks some caffeine daily.   Social Determinants of Health   Financial Resource Strain:  Not on file  Food Insecurity: Not on file  Transportation Needs: Not on file  Physical Activity: Not on file  Stress: Not on file  Social Connections: Not on file    Tobacco Counseling Counseling given: Not Answered Tobacco comments: Quit x 20 years   Clinical Intake:                 Diabetic?Yes         Activities of Daily Living In your present state of health, do you have any difficulty performing the following activities: 10/14/2020  Hearing? N  Vision? N  Difficulty concentrating or making decisions? N  Walking or climbing stairs? Y  Dressing or bathing? Y  Doing errands, shopping? N  Some recent data might be hidden    Patient Care Team: [name], MD as PCP - [place], Alphonse Guild, MD as PCP - Cardiology (Cardiology) Danie Binder, MD (Inactive) (Gastroenterology) Irine Seal, MD as Attending Physician (Urology) Kathrynn Ducking, MD (Neurology) Franchot Gallo, MD as Consulting Physician (Urology) Rutherford Guys, MD as Consulting Physician (Ophthalmology)  Indicate any recent Medical Services you may have received from other than [facility] providers in the past year (date may be approximate).     Assessment:   This is a routine wellness examination for [place].  Hearing/Vision screen No results found.  Dietary issues and exercise activities discussed:     Goals Addressed   None   Depression Screen PHQ 2/9 Scores 02/17/2021 09/21/2020 08/16/2020 05/19/2020 04/01/2020 04/01/2020 03/21/2020  PHQ - 2 Score 0 2 0 2 0 0 0  PHQ- 9 Score - 9 - 17 - - -    Fall Risk Fall Risk  03/23/2021 02/17/2021 09/21/2020 08/16/2020 06/21/2020  Falls in the past year? 0 0 0 0 0  Number falls in  past yr: 0 0 0 0 0  Injury with Fall? 0 0 0 0 0  Comment - - - - -  Risk Factor Category  - - - - -  Risk for fall due to : - - - No Fall Risks -  Follow up - - - Falls evaluation completed -    FALL RISK PREVENTION PERTAINING TO THE HOME:  Any stairs in  or around the home? Yes  If so, are there any without handrails? No  Home free of loose throw rugs in walkways, pet beds, electrical cords, etc? No  Adequate lighting in your home to reduce risk of falls? Yes   ASSISTIVE DEVICES UTILIZED TO PREVENT FALLS:  Life alert? Yes  Use of a cane, Mcgwire Dasaro or w/c? Yes  Grab bars in the bathroom? No  Shower chair or bench in shower? Yes  Elevated toilet seat or a handicapped toilet? No   TIMED UP AND GO:  Was the test performed? No .  Length of time to ambulate 10 feet: NA sec.     Cognitive Function:     6CIT Screen 04/01/2020 04/01/2019 03/24/2018 03/20/2017  What Year? 0 points 0 points 0 points 0 points  What month? 0 points 0 points 0 points 0 points  What time? 0 points 0 points 0 points 0 points  Count back from 20 0 points 0 points 0 points 0 points  Months in reverse 2 points 0 points 0 points 0 points  Repeat phrase 0 points 0 points 2 points 0 points  Total Score 2 0 2 0    Immunizations Immunization History  Administered Date(s) Administered   Fluad Quad(high Dose 65+) 03/21/2020, 02/17/2021   Influenza Split 03/17/2015   Influenza Whole 04/15/2007, 03/17/2009   Influenza,inj,Quad PF,6+ Mos 02/04/2014, 02/12/2017, 02/17/2018   Influenza-Unspecified 02/02/2019   Moderna Sars-Covid-2 Vaccination 07/22/2019, 08/19/2019, 04/23/2020   Pneumococcal Conjugate-13 12/21/2013   Pneumococcal Polysaccharide-23 01/19/2004, 05/23/2010, 02/02/2019   Td 06/07/2003   Tdap 06/14/2011   Zoster Recombinat (Shingrix) 04/01/2020, 05/31/2020   Zoster, Live 02/02/2008    TDAP status: Up to date  Flu Vaccine status: Up to date  Pneumococcal vaccine status: Up to date  Covid-19 vaccine status: Completed vaccines  Qualifies for Shingles Vaccine? Yes   Zostavax completed Yes   Shingrix Completed?: Yes  Screening Tests Health Maintenance  Topic Date Due   COVID-19 Vaccine (5 - Booster for Moderna series) 12/08/2020   TETANUS/TDAP   06/13/2021   HEMOGLOBIN A1C  08/03/2021   COLONOSCOPY (Pts 45-30yrs Insurance coverage will need to be confirmed)  09/21/2021   URINE MICROALBUMIN  02/01/2022   FOOT EXAM  02/19/2022   OPHTHALMOLOGY EXAM  03/08/2022   Pneumonia Vaccine 23+ Years old  Completed   INFLUENZA VACCINE  Completed   DEXA SCAN  Completed   Hepatitis C Screening  Completed   Zoster Vaccines- Shingrix  Completed   HPV VACCINES  Aged Out    Health Maintenance  Health Maintenance Due  Topic Date Due   COVID-19 Vaccine (5 - Booster for Moderna series) 12/08/2020    Colorectal cancer screening: Type of screening: Colonoscopy. Completed 09/22/2019. Repeat every 2 years  Mammogram status: Completed 06/07/2020. Repeat every year  Bone Density status: Completed 05/02/2018. Results reflect: Bone density results: OSTEOPOROSIS. Repeat every 2 years.  Lung Cancer Screening: (Low Dose CT Chest recommended if Age 13-80 years, 30 pack-year currently smoking OR have quit w/in 15years.) does not qualify.   Lung Cancer Screening Referral: NA  Additional Screening:  Hepatitis C Screening: does qualify; Completed 01/19/2015  Vision Screening: Recommended annual ophthalmology exams for early detection of glaucoma and other disorders of the eye. Is the patient up to date with their annual eye exam?  Yes  Who is the provider or what is the name of the office in which the patient attends annual eye exams?  If pt is not established with a provider, would they like to be referred to a provider to establish care? No .   Dental Screening: Recommended annual dental exams for proper oral hygiene  Community Resource Referral / Chronic Care Management: CRR required this visit?  No   CCM required this visit?  No      Plan:     I have personally reviewed and noted the following in the patient's chart:   Medical and social history Use of alcohol, tobacco or illicit drugs  Current medications and supplements including  opioid prescriptions.  Functional ability and status Nutritional status Physical activity Advanced directives List of other physicians Hospitalizations, surgeries, and ER visits in previous 12 months Vitals Screenings to include cognitive, depression, and falls Referrals and appointments  In addition, I have reviewed and discussed with patient certain preventive protocols, quality metrics, and best practice recommendations. A written personalized care plan for preventive services as well as general preventive health recommendations were provided to patient.     Danella Penton, [place]   04/17/2021   Nurse Notes: This was a telehealth visit. The patient was at home, the provider was in the office. Ihor Dow, MD

## 2021-04-17 NOTE — Patient Instructions (Signed)
Cheryl Abbott , Thank you for taking time to come for your Medicare Wellness Visit. I appreciate your ongoing commitment to your health goals. Please review the following plan we discussed and let me know if I can assist you in the future.   Screening recommendations/referrals: Colonoscopy: Up to date Mammogram: Up to date Bone Density: Up to date Recommended yearly ophthalmology/optometry visit for glaucoma screening and checkup Recommended yearly dental visit for hygiene and checkup  Vaccinations: Influenza vaccine: Up to date Pneumococcal vaccine: Up to date Tdap vaccine: up to date Shingles vaccine: up to date    Advanced directives: Information discussed  Conditions/risks identified: Diabetes, Hypertension, Hyperlipidemia  Next appointment: 1 year   Preventive Care 41 Years and Older, Female Preventive care refers to lifestyle choices and visits with your health care provider that can promote health and wellness. What does preventive care include? A yearly physical exam. This is also called an annual well check. Dental exams once or twice a year. Routine eye exams. Ask your health care provider how often you should have your eyes checked. Personal lifestyle choices, including: Daily care of your teeth and gums. Regular physical activity. Eating a healthy diet. Avoiding tobacco and drug use. Limiting alcohol use. Practicing safe sex. Taking low-dose aspirin every day. Taking vitamin and mineral supplements as recommended by your health care provider. What happens during an annual well check? The services and screenings done by your health care provider during your annual well check will depend on your age, overall health, lifestyle risk factors, and family history of disease. Counseling  Your health care provider may ask you questions about your: Alcohol use. Tobacco use. Drug use. Emotional well-being. Home and relationship well-being. Sexual activity. Eating  habits. History of falls. Memory and ability to understand (cognition). Work and work Statistician. Reproductive health. Screening  You may have the following tests or measurements: Height, weight, and BMI. Blood pressure. Lipid and cholesterol levels. These may be checked every 5 years, or more frequently if you are over 72 years old. Skin check. Lung cancer screening. You may have this screening every year starting at age 87 if you have a 30-pack-year history of smoking and currently smoke or have quit within the past 15 years. Fecal occult blood test (FOBT) of the stool. You may have this test every year starting at age 62. Flexible sigmoidoscopy or colonoscopy. You may have a sigmoidoscopy every 5 years or a colonoscopy every 10 years starting at age 20. Hepatitis C blood test. Hepatitis B blood test. Sexually transmitted disease (STD) testing. Diabetes screening. This is done by checking your blood sugar (glucose) after you have not eaten for a while (fasting). You may have this done every 1-3 years. Bone density scan. This is done to screen for osteoporosis. You may have this done starting at age 39. Mammogram. This may be done every 1-2 years. Talk to your health care provider about how often you should have regular mammograms. Talk with your health care provider about your test results, treatment options, and if necessary, the need for more tests. Vaccines  Your health care provider may recommend certain vaccines, such as: Influenza vaccine. This is recommended every year. Tetanus, diphtheria, and acellular pertussis (Tdap, Td) vaccine. You may need a Td booster every 10 years. Zoster vaccine. You may need this after age 80. Pneumococcal 13-valent conjugate (PCV13) vaccine. One dose is recommended after age 18. Pneumococcal polysaccharide (PPSV23) vaccine. One dose is recommended after age 52. Talk to your health care  provider about which screenings and vaccines you need and how  often you need them. This information is not intended to replace advice given to you by your health care provider. Make sure you discuss any questions you have with your health care provider. Document Released: 06/24/2015 Document Revised: 02/15/2016 Document Reviewed: 03/29/2015 Elsevier Interactive Patient Education  2017 [employer] Prevention in the Home Falls can cause injuries. They can happen to people of all ages. There are many things you can do to make your home safe and to help prevent falls. What can I do on the outside of my home? Regularly fix the edges of walkways and driveways and fix any cracks. Remove anything that might make you trip as you walk through a door, such as a raised step or threshold. Trim any bushes or trees on the path to your home. Use bright outdoor lighting. Clear any walking paths of anything that might make someone trip, such as rocks or tools. Regularly check to see if handrails are loose or broken. Make sure that both sides of any steps have handrails. Any raised decks and porches should have guardrails on the edges. Have any leaves, snow, or ice cleared regularly. Use sand or salt on walking paths during winter. Clean up any spills in your garage right away. This includes oil or grease spills. What can I do in the bathroom? Use night lights. Install grab bars by the toilet and in the tub and shower. Do not use towel bars as grab bars. Use non-skid mats or decals in the tub or shower. If you need to sit down in the shower, use a plastic, non-slip stool. Keep the floor dry. Clean up any water that spills on the floor as soon as it happens. Remove soap buildup in the tub or shower regularly. Attach bath mats securely with double-sided non-slip rug tape. Do not have throw rugs and other things on the floor that can make you trip. What can I do in the bedroom? Use night lights. Make sure that you have a light by your bed that is easy to  reach. Do not use any sheets or blankets that are too big for your bed. They should not hang down onto the floor. Have a firm chair that has side arms. You can use this for support while you get dressed. Do not have throw rugs and other things on the floor that can make you trip. What can I do in the kitchen? Clean up any spills right away. Avoid walking on wet floors. Keep items that you use a lot in easy-to-reach places. If you need to reach something above you, use a strong step stool that has a grab bar. Keep electrical cords out of the way. Do not use floor polish or wax that makes floors slippery. If you must use wax, use non-skid floor wax. Do not have throw rugs and other things on the floor that can make you trip. What can I do with my stairs? Do not leave any items on the stairs. Make sure that there are handrails on both sides of the stairs and use them. Fix handrails that are broken or loose. Make sure that handrails are as long as the stairways. Check any carpeting to make sure that it is firmly attached to the stairs. Fix any carpet that is loose or worn. Avoid having throw rugs at the top or bottom of the stairs. If you do have throw rugs, attach them to the  floor with carpet tape. Make sure that you have a light switch at the top of the stairs and the bottom of the stairs. If you do not have them, ask someone to add them for you. What else can I do to help prevent falls? Wear shoes that: Do not have high heels. Have rubber bottoms. Are comfortable and fit you well. Are closed at the toe. Do not wear sandals. If you use a stepladder: Make sure that it is fully opened. Do not climb a closed stepladder. Make sure that both sides of the stepladder are locked into place. Ask someone to hold it for you, if possible. Clearly mark and make sure that you can see: Any grab bars or handrails. First and last steps. Where the edge of each step is. Use tools that help you move  around (mobility aids) if they are needed. These include: Canes. Walkers. Scooters. Crutches. Turn on the lights when you go into a dark area. Replace any light bulbs as soon as they burn out. Set up your furniture so you have a clear path. Avoid moving your furniture around. If any of your floors are uneven, fix them. If there are any pets around you, be aware of where they are. Review your medicines with your doctor. Some medicines can make you feel dizzy. This can increase your chance of falling. Ask your doctor what other things that you can do to help prevent falls. This information is not intended to replace advice given to you by your health care provider. Make sure you discuss any questions you have with your health care provider. Document Released: 03/24/2009 Document Revised: 11/03/2015 Document Reviewed: 07/02/2014 Elsevier Interactive Patient Education  2017 [employer].

## 2021-04-19 DIAGNOSIS — R0902 Hypoxemia: Secondary | ICD-10-CM | POA: Diagnosis not present

## 2021-04-27 ENCOUNTER — Other Ambulatory Visit: Payer: Self-pay

## 2021-04-27 ENCOUNTER — Ambulatory Visit (INDEPENDENT_AMBULATORY_CARE_PROVIDER_SITE_OTHER): Payer: Medicare Other | Admitting: Family Medicine

## 2021-04-27 ENCOUNTER — Encounter: Payer: Self-pay | Admitting: Family Medicine

## 2021-04-27 VITALS — BP 108/63 | HR 95 | Resp 16 | Ht 67.0 in | Wt 196.0 lb

## 2021-04-27 DIAGNOSIS — R140 Abdominal distension (gaseous): Secondary | ICD-10-CM

## 2021-04-27 DIAGNOSIS — I10 Essential (primary) hypertension: Secondary | ICD-10-CM | POA: Diagnosis not present

## 2021-04-27 DIAGNOSIS — E669 Obesity, unspecified: Secondary | ICD-10-CM | POA: Diagnosis not present

## 2021-04-27 DIAGNOSIS — E1121 Type 2 diabetes mellitus with diabetic nephropathy: Secondary | ICD-10-CM | POA: Diagnosis not present

## 2021-04-27 MED ORDER — TRULICITY 0.75 MG/0.5ML ~~LOC~~ SOAJ: 0.7500 mg | SUBCUTANEOUS | 1 refills | Status: DC

## 2021-04-27 NOTE — Progress Notes (Signed)
Cheryl Abbott     MRN: [id number]      DOB: January 27, 1946   HPI Cheryl Abbott is here for follow up and re-evaluation of chronic medical conditions, medication management and review of any available recent lab and radiology data.  Preventive health is updated, specifically  Cancer screening and Immunization.   Questions or concerns regarding consultations or procedures which the PT has had in the interim are  addressed. The PT denies any adverse reactions to current medications since the last visit.  There are no new concerns.  There are no specific complaints   ROS Denies recent fever or chills. Denies sinus pressure, nasal congestion, ear pain or sore throat. Denies chest congestion, productive cough or wheezing. Denies chest pains, palpitations and leg swelling Denies abdominal pain, nausea, vomiting,diarrhea or constipation.   Denies dysuria, frequency, hesitancy or incontinence. Denies joint pain, swelling and limitation in mobility. Denies headaches, seizures, numbness, or tingling. Denies depression, anxiety or insomnia. Denies skin break down or rash.   PE  BP 108/63   Pulse 95   Resp 16   Ht 5\' 7"  (1.702 m)   Wt 196 lb (88.9 kg)   SpO2 96%   BMI 30.70 kg/m   Patient alert and oriented and in no cardiopulmonary distress.  HEENT: No facial asymmetry, EOMI,     Neck supple .  Chest: Clear to auscultation bilaterally.  CVS: S1, S2 no murmurs, no S3.Regular rate.  ABD: .  Distended, obese, no localized tenderness or guarding, normal BS,unable to determine mas or organomegaly due to extreme distension  Ext: No edema  MS: Adequate though reduced  ROM spine, shoulders, hips and knees.  Skin: Intact, no ulcerations or rash noted.  Psych: Good eye contact, normal affect. Memory intact not anxious or depressed appearing.  CNS: CN 2-12 intact, power,  normal throughout.no focal deficits noted.   Assessment & Plan  Abdominal distension Progressive distension by  history, physical exam consistent with marked abdominal distension, need imaging to evaluate for organomegaly or mass  Obesity (BMI 30.0-34.9)  Patient re-educated about  the importance of commitment to a  minimum of 150 minutes of exercise per week as able.  The importance of healthy food choices with portion control discussed, as well as eating regularly and within a 12 hour window most days. The need to choose "clean , green" food 50 to 75% of the time is discussed, as well as to make water the primary drink and set a goal of 64 ounces water daily.    Weight /BMI 04/27/2021 03/23/2021 03/06/2021  WEIGHT 196 lb 197 lb 193 lb 9.6 oz  HEIGHT 5\' 7"  5\' 7"  5' 6.5"  BMI 30.7 kg/m2 30.85 kg/m2 30.78 kg/m2      Type 2 diabetes with nephropathy ([place]) Cheryl Abbott is reminded of the importance of commitment to daily physical activity for 30 minutes or more, as able and the need to limit carbohydrate intake to 30 to 60 grams per meal to help with blood sugar control.   The need to take medication as prescribed, test blood sugar as directed, and to call between visits if there is a concern that blood sugar is uncontrolled is also discussed.   Cheryl Abbott is reminded of the importance of daily foot exam, annual eye examination, and good blood sugar, blood pressure and cholesterol control. Stop tradjenta and start trulicity  Diabetic Labs Latest Ref Rng & Units 02/01/2021 01/31/2021 11/29/2020 10/25/2020 10/05/2020  HbA1c 4.8 - 5.6 % -  7.0(H) - - 6.4(H)  Microalbumin Not Estab. ug/mL 70.9(H) - - - -  Micro/Creat Ratio 0 - 29 mg/g creat 43(H) - - - -  Chol 0 - 200 mg/dL - 162 - - 156  HDL >40 mg/dL - 40(L) - - 45  Calc LDL 0 - 99 mg/dL - 77 - - 83  Triglycerides <150 mg/dL - 227(H) - - 165(H)  Creatinine 0.44 - 1.00 mg/dL - 1.77(H) 2.13(H) 1.92(H) 2.34(H)   BP/Weight 04/27/2021 03/23/2021 03/06/2021 02/28/2021 02/17/2021 02/09/2021 2/95/6213  Systolic BP 086 578 469 629 528 - 413  Diastolic BP 63 82 78  70 66 - 74  Wt. (Lbs) 196 197 193.6 190 194 195 195  BMI 30.7 30.85 30.78 29.76 30.38 31 31   Foot/eye exam completion dates Latest Ref Rng & Units 03/08/2021 02/17/2021  Eye Exam No Retinopathy No Retinopathy -  Foot exam Order - - -  Foot Form Completion - - Done         Hypertension goal BP (blood pressure) < 130/80 Controlled, no change in medication DASH diet and commitment to daily physical activity for a minimum of 30 minutes discussed and encouraged, as a part of hypertension management. The importance of attaining a healthy weight is also discussed.  BP/Weight 04/27/2021 03/23/2021 03/06/2021 02/28/2021 02/17/2021 02/09/2021 2/44/0102  Systolic BP 725 366 440 347 425 - 956  Diastolic BP 63 82 78 70 66 - 74  Wt. (Lbs) 196 197 193.6 190 194 195 195  BMI 30.7 30.85 30.78 29.76 30.38 31 31

## 2021-04-27 NOTE — Patient Instructions (Signed)
F/U mid Janaury, re evaluate diabetes and weight , call if you need me sooner  You are referred for abdomninal scan and alsoto see gI re abdominal symptoms  Stop tradjenta, new for diabetes o is once weekly trulicity  CLE7N, chem 7 and EGFR non fasting 5 days before January appointment  Thanks for choosing [facility], we consider it a privelige to serve you.

## 2021-05-01 ENCOUNTER — Encounter: Payer: Self-pay | Admitting: Internal Medicine

## 2021-05-02 IMAGING — DX METASTATIC BONE SURVEY
9 of 10 series · 9 of 10 positions shown · non-contrast
Comparison: 03/18/2018

CLINICAL DATA: Multiple myeloma

EXAM:
METASTATIC BONE SURVEY

[skull lat]
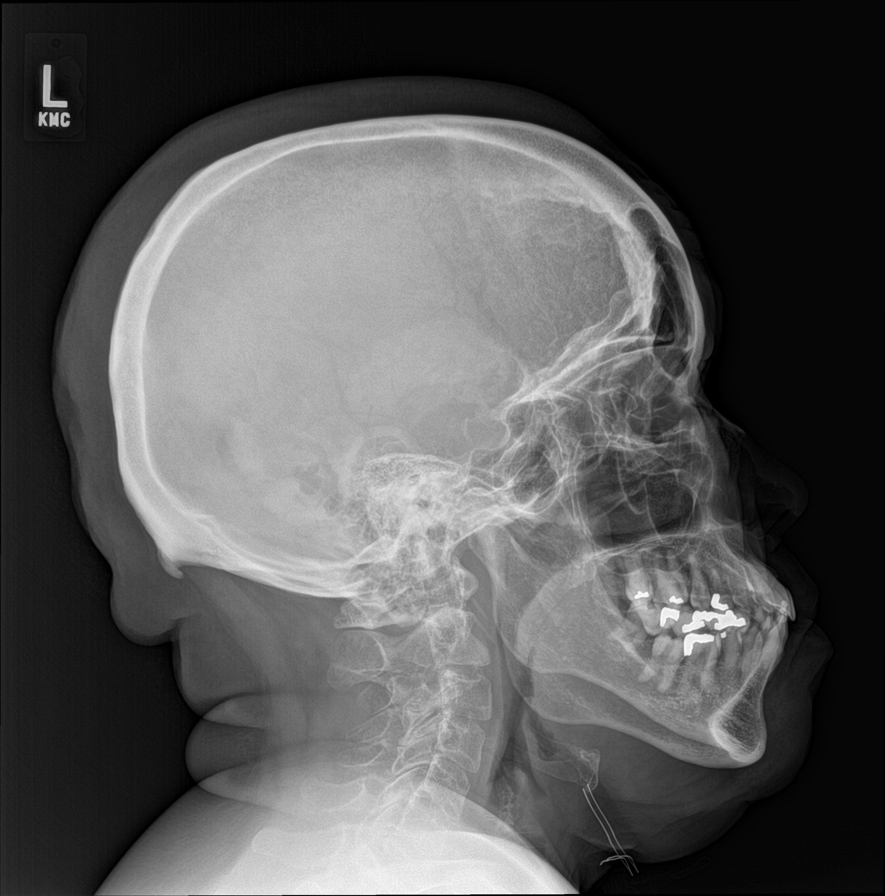

[shoulder ap (1 of 2)]
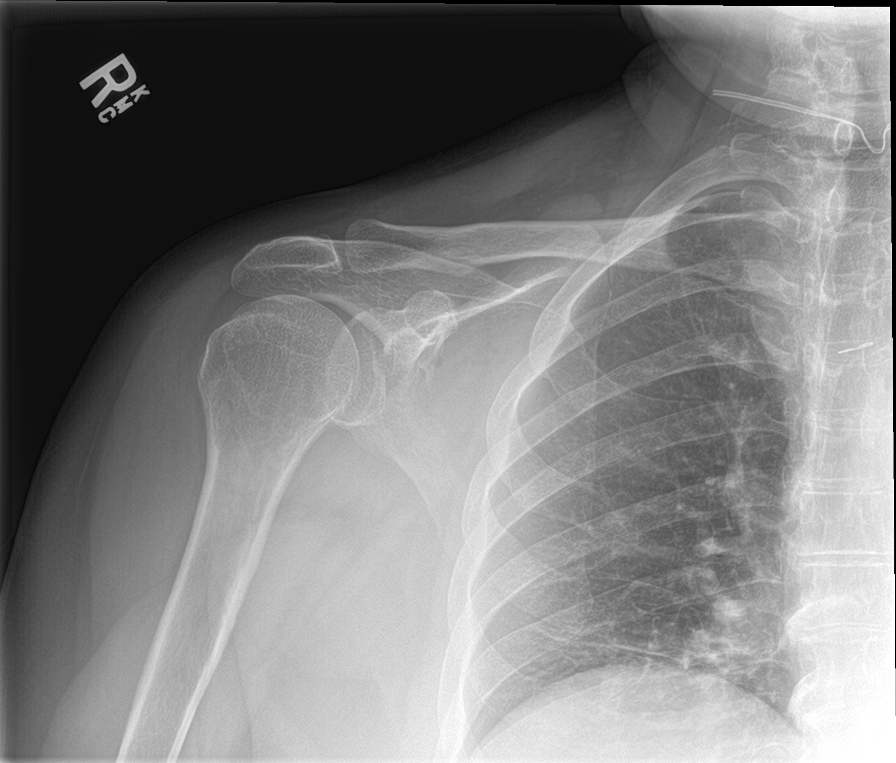

[shoulder ap (2 of 2)]
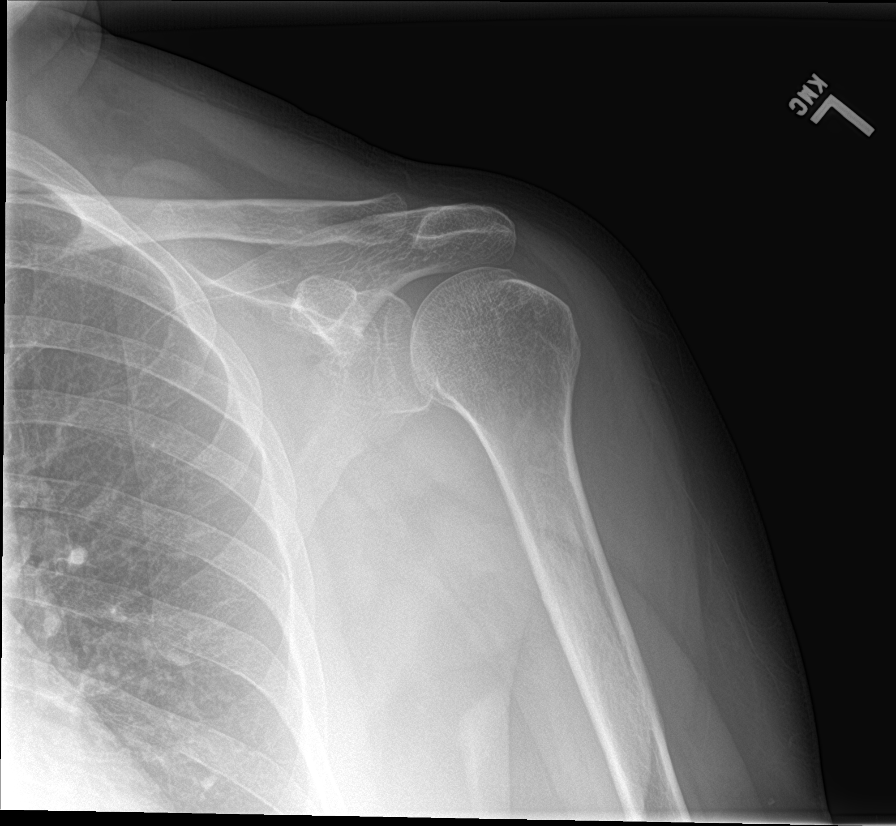

[humerus ap (1 of 2)]
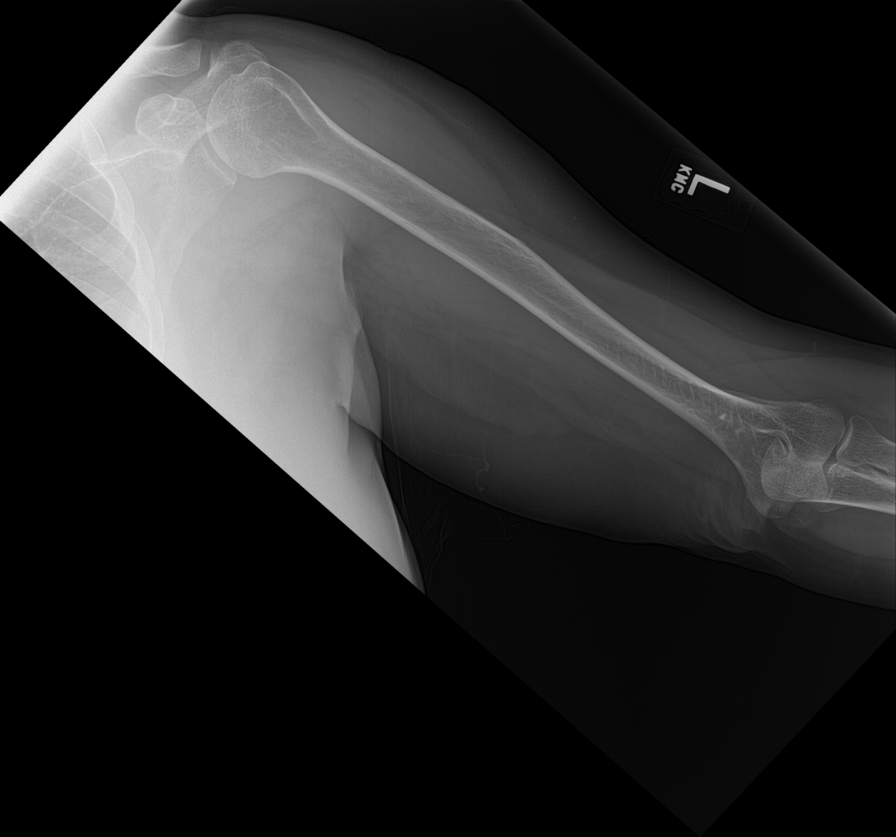

[humerus ap (2 of 2)]
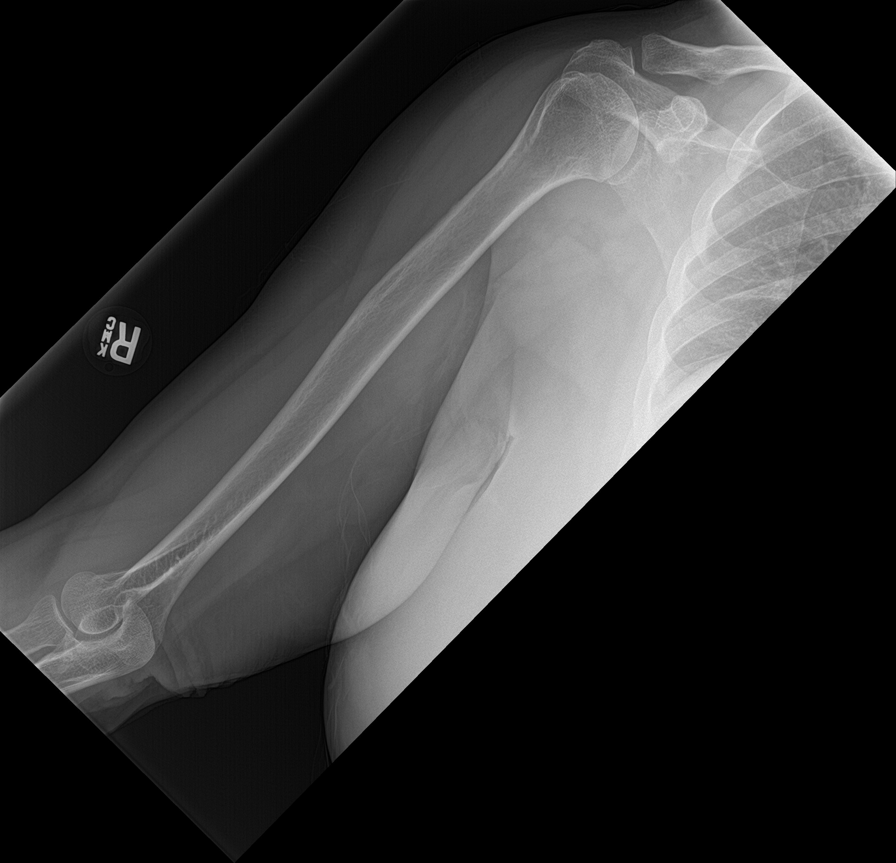

[forearm ap (1 of 2)]
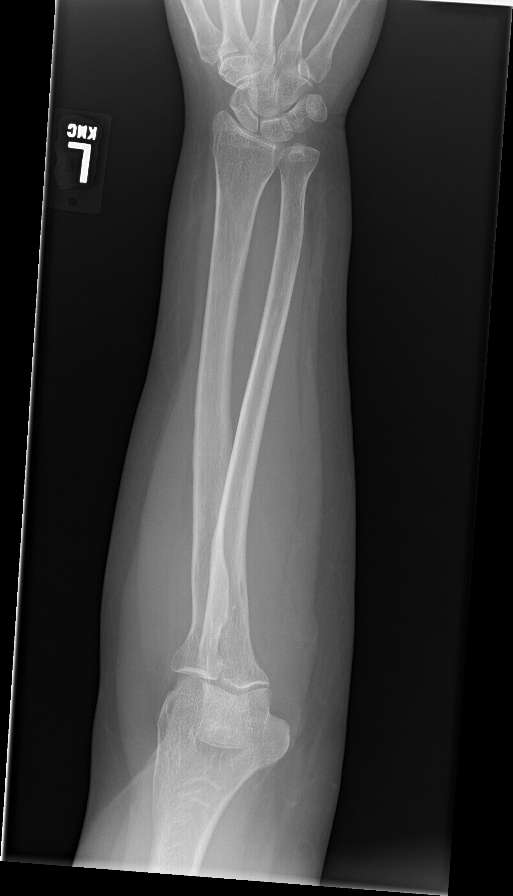

[forearm ap (2 of 2)]
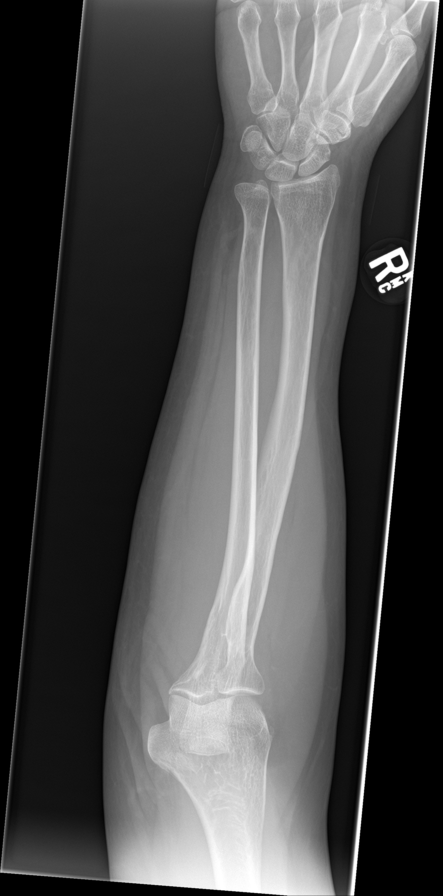

[c-spine ap]
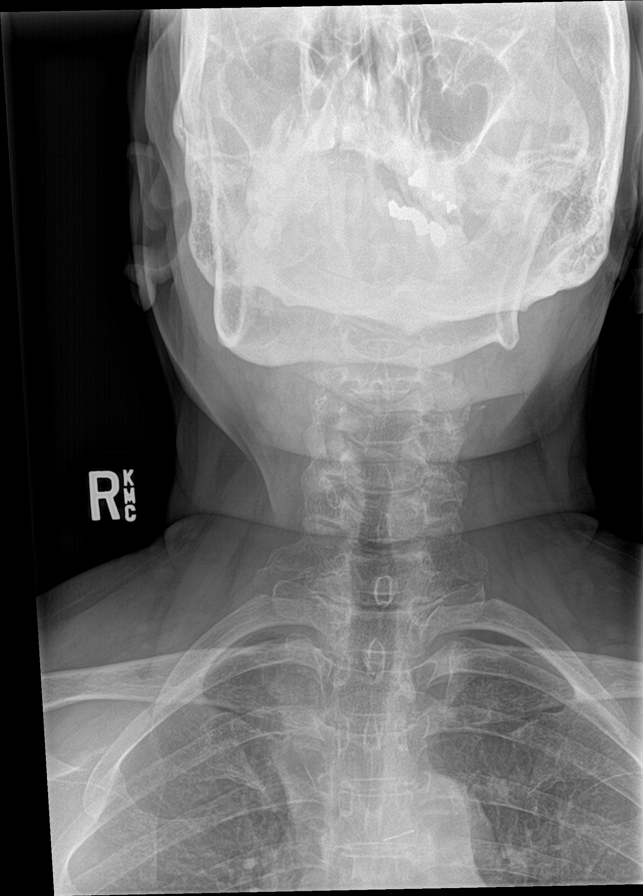

[c-spine lat]
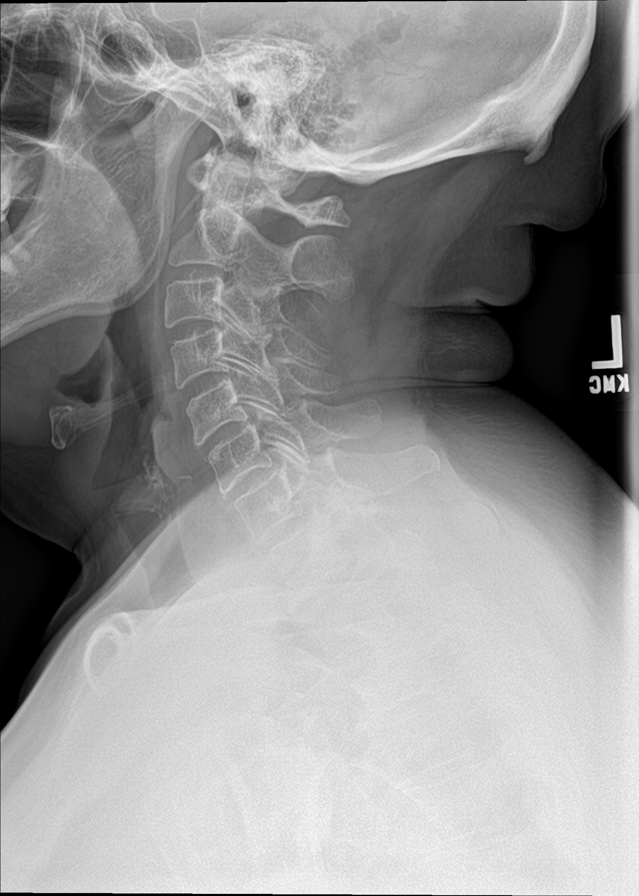

[9 of 10 positions shown; findings below may reference images not displayed]

FINDINGS: Heart is normal size. Slight elevation of the right hemidiaphragm
with bibasilar atelectasis or scarring. No effusions.

No focal lytic lesion or acute bony abnormality. Degenerative
changes in the cervical, thoracic and lumbar spine.
IMPRESSION: No acute bony abnormality or focal lytic lesion.

## 2021-05-03 ENCOUNTER — Encounter: Payer: Self-pay | Admitting: Family Medicine

## 2021-05-03 DIAGNOSIS — R140 Abdominal distension (gaseous): Secondary | ICD-10-CM | POA: Insufficient documentation

## 2021-05-03 NOTE — Assessment & Plan Note (Signed)
  Patient re-educated about  the importance of commitment to a  minimum of 150 minutes of exercise per week as able.  The importance of healthy food choices with portion control discussed, as well as eating regularly and within a 12 hour window most days. The need to choose "clean , green" food 50 to 75% of the time is discussed, as well as to make water the primary drink and set a goal of 64 ounces water daily.    Weight /BMI 04/27/2021 03/23/2021 03/06/2021  WEIGHT 196 lb 197 lb 193 lb 9.6 oz  HEIGHT 5\' 7"  5\' 7"  5' 6.5"  BMI 30.7 kg/m2 30.85 kg/m2 30.78 kg/m2

## 2021-05-03 NOTE — Assessment & Plan Note (Signed)
Cheryl Abbott is reminded of the importance of commitment to daily physical activity for 30 minutes or more, as able and the need to limit carbohydrate intake to 30 to 60 grams per meal to help with blood sugar control.   The need to take medication as prescribed, test blood sugar as directed, and to call between visits if there is a concern that blood sugar is uncontrolled is also discussed.   Cheryl Abbott is reminded of the importance of daily foot exam, annual eye examination, and good blood sugar, blood pressure and cholesterol control. Stop tradjenta and start trulicity  Diabetic Labs Latest Ref Rng & Units 02/01/2021 01/31/2021 11/29/2020 10/25/2020 10/05/2020  HbA1c 4.8 - 5.6 % - 7.0(H) - - 6.4(H)  Microalbumin Not Estab. ug/mL 70.9(H) - - - -  Micro/Creat Ratio 0 - 29 mg/g creat 43(H) - - - -  Chol 0 - 200 mg/dL - 162 - - 156  HDL >40 mg/dL - 40(L) - - 45  Calc LDL 0 - 99 mg/dL - 77 - - 83  Triglycerides <150 mg/dL - 227(H) - - 165(H)  Creatinine 0.44 - 1.00 mg/dL - 1.77(H) 2.13(H) 1.92(H) 2.34(H)   BP/Weight 04/27/2021 03/23/2021 03/06/2021 02/28/2021 02/17/2021 02/09/2021 11/22/4313  Systolic BP 400 867 619 509 326 - 712  Diastolic BP 63 82 78 70 66 - 74  Wt. (Lbs) 196 197 193.6 190 194 195 195  BMI 30.7 30.85 30.78 29.76 30.38 31 31   Foot/eye exam completion dates Latest Ref Rng & Units 03/08/2021 02/17/2021  Eye Exam No Retinopathy No Retinopathy -  Foot exam Order - - -  Foot Form Completion - - Done

## 2021-05-03 NOTE — Assessment & Plan Note (Signed)
Controlled, no change in medication DASH diet and commitment to daily physical activity for a minimum of 30 minutes discussed and encouraged, as a part of hypertension management. The importance of attaining a healthy weight is also discussed.  BP/Weight 04/27/2021 03/23/2021 03/06/2021 02/28/2021 02/17/2021 02/09/2021 3/68/5992  Systolic BP 341 443 601 658 006 - 349  Diastolic BP 63 82 78 70 66 - 74  Wt. (Lbs) 196 197 193.6 190 194 195 195  BMI 30.7 30.85 30.78 29.76 30.38 31 31

## 2021-05-03 NOTE — Assessment & Plan Note (Signed)
Progressive distension by history, physical exam consistent with marked abdominal distension, need imaging to evaluate for organomegaly or mass

## 2021-05-08 ENCOUNTER — Telehealth: Payer: Self-pay

## 2021-05-08 NOTE — Telephone Encounter (Signed)
Patient called need prescription sent to [facility] for diabetic shoes.

## 2021-05-09 ENCOUNTER — Other Ambulatory Visit: Payer: Self-pay

## 2021-05-09 DIAGNOSIS — E1121 Type 2 diabetes mellitus with diabetic nephropathy: Secondary | ICD-10-CM

## 2021-05-09 MED ORDER — UNABLE TO FIND: 0 refills | Status: AC

## 2021-05-09 NOTE — Telephone Encounter (Signed)
Rx faxed

## 2021-05-11 ENCOUNTER — Other Ambulatory Visit: Payer: Self-pay

## 2021-05-11 ENCOUNTER — Inpatient Hospital Stay (HOSPITAL_COMMUNITY): Payer: Medicare Other | Attending: Hematology

## 2021-05-11 DIAGNOSIS — Z86010 Personal history of colonic polyps: Secondary | ICD-10-CM | POA: Diagnosis not present

## 2021-05-11 DIAGNOSIS — D509 Iron deficiency anemia, unspecified: Secondary | ICD-10-CM | POA: Insufficient documentation

## 2021-05-11 DIAGNOSIS — Z8673 Personal history of transient ischemic attack (TIA), and cerebral infarction without residual deficits: Secondary | ICD-10-CM | POA: Diagnosis not present

## 2021-05-11 DIAGNOSIS — R200 Anesthesia of skin: Secondary | ICD-10-CM | POA: Diagnosis not present

## 2021-05-11 DIAGNOSIS — R5383 Other fatigue: Secondary | ICD-10-CM | POA: Diagnosis not present

## 2021-05-11 DIAGNOSIS — M129 Arthropathy, unspecified: Secondary | ICD-10-CM | POA: Insufficient documentation

## 2021-05-11 DIAGNOSIS — E119 Type 2 diabetes mellitus without complications: Secondary | ICD-10-CM | POA: Insufficient documentation

## 2021-05-11 DIAGNOSIS — K5900 Constipation, unspecified: Secondary | ICD-10-CM | POA: Diagnosis not present

## 2021-05-11 DIAGNOSIS — Z79899 Other long term (current) drug therapy: Secondary | ICD-10-CM | POA: Diagnosis not present

## 2021-05-11 DIAGNOSIS — C9000 Multiple myeloma not having achieved remission: Secondary | ICD-10-CM | POA: Insufficient documentation

## 2021-05-11 DIAGNOSIS — K219 Gastro-esophageal reflux disease without esophagitis: Secondary | ICD-10-CM | POA: Diagnosis not present

## 2021-05-11 DIAGNOSIS — D472 Monoclonal gammopathy: Secondary | ICD-10-CM

## 2021-05-11 LAB — IRON AND TIBC
Iron: 75 ug/dL (ref 28–170)
Saturation Ratios: 17 % (ref 10.4–31.8)
TIBC: 432 ug/dL (ref 250–450)
UIBC: 357 ug/dL

## 2021-05-11 LAB — COMPREHENSIVE METABOLIC PANEL
ALT: 15 U/L (ref 0–44)
AST: 16 U/L (ref 15–41)
Albumin: 4.4 g/dL (ref 3.5–5.0)
Alkaline Phosphatase: 90 U/L (ref 38–126)
Anion gap: 8 (ref 5–15)
BUN: 16 mg/dL (ref 8–23)
CO2: 28 mmol/L (ref 22–32)
Calcium: 10.4 mg/dL — ABNORMAL HIGH (ref 8.9–10.3)
Chloride: 102 mmol/L (ref 98–111)
Creatinine, Ser: 1.83 mg/dL — ABNORMAL HIGH (ref 0.44–1.00)
GFR, Estimated: 28 mL/min — ABNORMAL LOW (ref >60)
Glucose, Bld: 125 mg/dL — ABNORMAL HIGH (ref 70–99)
Potassium: 3.7 mmol/L (ref 3.5–5.1)
Sodium: 138 mmol/L (ref 135–145)
Total Bilirubin: 0.4 mg/dL (ref 0.3–1.2)
Total Protein: 8.9 g/dL — ABNORMAL HIGH (ref 6.5–8.1)

## 2021-05-11 LAB — LACTATE DEHYDROGENASE: LDH: 116 U/L (ref 98–192)

## 2021-05-11 LAB — CBC WITH DIFFERENTIAL/PLATELET
Abs Immature Granulocytes: 0.04 10*3/uL (ref 0.00–0.07)
Basophils Absolute: 0 10*3/uL (ref 0.0–0.1)
Basophils Relative: 1 %
Eosinophils Absolute: 0.2 10*3/uL (ref 0.0–0.5)
Eosinophils Relative: 2 %
HCT: 42.1 % (ref 36.0–46.0)
Hemoglobin: 13.7 g/dL (ref 12.0–15.0)
Immature Granulocytes: 1 %
Lymphocytes Relative: 21 %
Lymphs Abs: 1.6 10*3/uL (ref 0.7–4.0)
MCH: 31.6 pg (ref 26.0–34.0)
MCHC: 32.5 g/dL (ref 30.0–36.0)
MCV: 97 fL (ref 80.0–100.0)
Monocytes Absolute: 0.6 10*3/uL (ref 0.1–1.0)
Monocytes Relative: 8 %
Neutro Abs: 5.3 10*3/uL (ref 1.7–7.7)
Neutrophils Relative %: 67 %
Platelets: 290 10*3/uL (ref 150–400)
RBC: 4.34 MIL/uL (ref 3.87–5.11)
RDW: 14.4 % (ref 11.5–15.5)
WBC: 7.8 10*3/uL (ref 4.0–10.5)
nRBC: 0 % (ref 0.0–0.2)

## 2021-05-11 LAB — FERRITIN: Ferritin: 25 ng/mL (ref 11–307)

## 2021-05-12 LAB — KAPPA/LAMBDA LIGHT CHAINS
Kappa free light chain: 65.6 mg/L — ABNORMAL HIGH (ref 3.3–19.4)
Kappa, lambda light chain ratio: 4.43 — ABNORMAL HIGH (ref 0.26–1.65)
Lambda free light chains: 14.8 mg/L (ref 5.7–26.3)

## 2021-05-15 LAB — PROTEIN ELECTROPHORESIS, SERUM
A/G Ratio: 0.8 (ref 0.7–1.7)
Albumin ELP: 4 g/dL (ref 2.9–4.4)
Alpha-1-Globulin: 0.2 g/dL (ref 0.0–0.4)
Alpha-2-Globulin: 1.1 g/dL — ABNORMAL HIGH (ref 0.4–1.0)
Beta Globulin: 1.3 g/dL (ref 0.7–1.3)
Gamma Globulin: 2.2 g/dL — ABNORMAL HIGH (ref 0.4–1.8)
Globulin, Total: 4.8 g/dL — ABNORMAL HIGH (ref 2.2–3.9)
M-Spike, %: 1.2 g/dL — ABNORMAL HIGH
Total Protein ELP: 8.8 g/dL — ABNORMAL HIGH (ref 6.0–8.5)

## 2021-05-17 ENCOUNTER — Telehealth: Payer: Self-pay

## 2021-05-17 ENCOUNTER — Other Ambulatory Visit: Payer: Self-pay

## 2021-05-17 MED ORDER — TRULICITY 0.75 MG/0.5ML ~~LOC~~ SOAJ
0.7500 mg | SUBCUTANEOUS | 1 refills | Status: DC
Start: 2021-05-17 — End: 2021-06-12

## 2021-05-17 NOTE — Progress Notes (Signed)
[name], [name] [address]   CLINIC:  Medical Oncology/Hematology  PCP:  [name], MD [address] [place] [address] [phone]   REASON FOR VISIT:  Follow-up for IgG kappa smoldering myeloma & iron deficiency anemia  PRIOR THERAPY: Intermittent IV iron infusions  CURRENT THERAPY: Observation  INTERVAL HISTORY:  [name] 75 y.o. female returns for routine follow-up of her IgG kappa smoldering myeloma and her iron deficiency anemia.  She was last evaluated via telemedicine visit by Tarri Abernethy PA-C on 02/09/2021.  At today's visit, she reports feeling fair.  No recent hospitalizations, surgeries, or changes in baseline health status.  Her chief concern today is the fact that she has not had a bowel movement for the past 6 days.  She reports feelings of bloating, abdominal discomfort, and nausea without vomiting.  She reports that her constipation has not been relieved with Ex-Lax and castor oil that she is taken at home.  Regarding her smoldering myeloma, she has not noted any new symptoms.  She does continue to have some chronic pain in her right leg, but denies any new bone pain or fractures.  She continues to have night sweats, but denies any fever, chills, or unintentional weight loss.  She admits to tinnitus as well as numbness in her feet.  No new onset hearing loss, blurred vision, headache, or dizziness.  No thromboembolic events.  No new masses or lymphadenopathy.  She denies any signs or symptoms of blood loss such as hematemesis, hematochezia, melena, or epistaxis.  She does admit to fatigue with energy at about 50%.  She has symptoms of restless legs and dyspnea on exertion.  No pica, chest pain, lightheadedness, or syncope.  She has 50% energy and 50% appetite. She endorses that she is maintaining a stable weight.    REVIEW OF SYSTEMS:  Review of Systems  Constitutional:  Positive for  fatigue. Negative for appetite change, chills, diaphoresis, fever and unexpected weight change.  HENT:   Negative for lump/mass and nosebleeds.   Eyes:  Negative for eye problems.  Respiratory:  Positive for shortness of breath. Negative for cough and hemoptysis.   Cardiovascular:  Negative for chest pain, leg swelling and palpitations.  Gastrointestinal:  Positive for abdominal distention, abdominal pain, constipation and nausea. Negative for blood in stool, diarrhea and vomiting.  Genitourinary:  Negative for hematuria.   Musculoskeletal:  Positive for arthralgias and back pain.  Skin: Negative.   Neurological:  Positive for numbness. Negative for dizziness, headaches and light-headedness.  Hematological:  Does not bruise/bleed easily.     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 9     History of adenomatous polyp of colon    2008   History of kidney stones    Hypercalcemia    Hypercalcemia 11/30/2010   Hyperlipidemia    Hypertension    Lactose intolerance in adult 02/01/2016   Left ureteral stone    Lipoma of arm 01/19/2015  Lumbar disc disease with radiculopathy    Nephrolithiasis    Neuropathic pain of both feet 04/11/2016   Neuropathy, peripheral    Nocturnal hypoxia 02/04/2014   Study confirms this done 02/10/2014    OSA treated with BiPAP    per study 2007   RENAL CALCULUS 03/15/2009   Qualifier: Diagnosis of  By: Zeb Comfort     Sciatica    Shoulder pain, bilateral    Sigmoid diverticulosis    TIA (transient ischemic attack) 02/26/2016   Type 2 diabetes mellitus ([facility])    Vaginal dryness, menopausal  10/14/2016   Past Surgical History:  Procedure Laterality Date   BIOPSY N/A 03/17/2013   Procedure: GASTRIC BIOPSIES;  Surgeon: Danie Binder, MD;  Location: [facility];  Service: Endoscopy;  Laterality: N/A;   BIOPSY  06/10/2015   Procedure: BIOPSY;  Surgeon: Danie Binder, MD;  [facility];  Service: Endoscopy;;  gastric biopsy   CARDIAC CATHETERIZATION  05-11-2003  dr Shelva Majestic ([facility])   Abnormal cardiolite/   Normal coronary arteries and normal LVF,  ef  63%   CARDIOVASCULAR STRESS TEST  01-01-2014   normal lexiscan cardiolite/  no ischemia/ infarct/  normal LV function and wall motion ,  ef 81%   COLONOSCOPY  last one 10/02/2011   [facility] TICS, IH, NEXT TCS APR 2018   COLONOSCOPY WITH PROPOFOL N/A 02/26/2017   Procedure: COLONOSCOPY WITH PROPOFOL;  Surgeon: Danie Binder, MD;  [facility];  Service: Endoscopy;  Laterality: N/A;  11:00am   COLONOSCOPY WITH PROPOFOL N/A 09/22/2019   Procedure: COLONOSCOPY WITH PROPOFOL;  Surgeon: Danie Binder, MD;  [facility];  Service: Endoscopy;  Laterality: N/A;  1:15   CYSTO/   RIGHT URETEROSOCOPY LASER LITHOTRIPSY STONE EXTRACTION  10-13-2004   CYSTO/  RIGHT RETROGRADE PYELOGRAM/  PLACMENT RIGHT URETERAL STENT  01-10-2010   CYSTOSCOPY W/ URETERAL STENT PLACEMENT Right 08/19/2015   Procedure: CYSTOSCOPY WITH RETROGRADE PYELOGRAM/URETERAL STENT PLACEMENT;  Surgeon: Franchot Gallo, MD;  Location: [facility];  Service: Urology;  Laterality: Right;   CYSTOSCOPY W/ URETERAL STENT PLACEMENT Left 02/23/2020   Procedure: CYSTOSCOPY LEFT  RETROGRADE PYELOGRAM LEFT URETEROSCOPY URETERAL STENT PLACEMENT;  Surgeon: Franchot Gallo, MD;  Location: [facility];  Service: Urology;  Laterality: Left;   CYSTOSCOPY WITH RETROGRADE PYELOGRAM, URETEROSCOPY AND STENT PLACEMENT Left 10/21/2014   Procedure: CYSTOSCOPY WITH RETROGRADE PYELOGRAM, URETEROSCOPY AND STENT PLACEMENT;  Surgeon: Franchot Gallo, MD;  Location[facility];  Service: Urology;  Laterality: Left;   CYSTOSCOPY WITH RETROGRADE PYELOGRAM, URETEROSCOPY AND STENT PLACEMENT Right 11/22/2015   Procedure: CYSTOSCOPY, RIGHT URETERAL STENT REMOVAL; RIGHT RETROGRADE PYELOGRAM, RIGHT URETEROSCOPY WITH BALLOON DILATION; RIGHT URETERAL STENT PLACEMENT;  Surgeon: Franchot Gallo, MD;  Location: [facility];  Service: Urology;  Laterality: Right;   CYSTOSCOPY WITH RETROGRADE PYELOGRAM, URETEROSCOPY AND STENT PLACEMENT Left 10/18/2020   Procedure: CYSTOSCOPY WITH LEFT RETROGRADE PYELOGRAM, LEFT URETEROSCOPY  WITH LASER AND LEFT URETERAL STENT PLACEMENT;  Surgeon: Franchot Gallo, MD;  [facility];  Service: Urology;  Laterality: Left;   CYSTOSCOPY/RETROGRADE/URETEROSCOPY/STONE EXTRACTION WITH BASKET Bilateral 08/02/2015   Procedure: CYSTOSCOPY; BILATERAL RETROGRADE PYELOGRAMS; BILATERAL URETEROSCOPY, STONE EXTRACTION WITH BASKET;  Surgeon: Franchot Gallo, MD;  [facility];  Service: Urology;  Laterality: Bilateral;   CYSTOSCOPY/RETROGRADE/URETEROSCOPY/STONE EXTRACTION WITH BASKET Right 06/28/2015   Procedure: CYSTOSCOPY/RETROGRADE/URETEROSCOPY/STONE EXTRACTION WITH BASKET, RIGHT URETERAL DOUBLE J STENT PLACEMENT;  Surgeon: Franchot Gallo, MD;  [facility]: [facility];  Service: Urology;  Laterality:  Right;   ESOPHAGOGASTRODUODENOSCOPY (EGD) WITH PROPOFOL N/A 03/17/2013   Procedure: ESOPHAGOGASTRODUODENOSCOPY (EGD) WITH PROPOFOL;  Surgeon: Danie Binder, MD;  [facility]: [facility];  Service: Endoscopy;  Laterality: N/A;   ESOPHAGOGASTRODUODENOSCOPY (EGD) WITH PROPOFOL N/A 06/10/2015   Distal gastritis, distal esophageal stricture s/p dilation   ESOPHAGOGASTRODUODENOSCOPY (EGD) WITH PROPOFOL N/A 02/26/2017   Procedure: ESOPHAGOGASTRODUODENOSCOPY (EGD) WITH PROPOFOL;  Surgeon: Danie Binder, MD;  [facility];  Service: Endoscopy;  Laterality: N/A;   FLEXIBLE SIGMOIDOSCOPY  09/11/2011   [id number] Internal hemorrhoids   HOLMIUM LASER  APPLICATION Left 9/38/1829   Procedure: HOLMIUM LASER APPLICATION;  Surgeon: Franchot Gallo, MD;  Location: [facility];  Service: Urology;  Laterality: Left;   HOLMIUM LASER APPLICATION Right 9/37/1696   Procedure: HOLMIUM LASER APPLICATION;  Surgeon: Franchot Gallo, MD;  [facility]: [facility];  Service: Urology;  Laterality: Right;   HOLMIUM LASER APPLICATION  7/89/3810   Procedure: HOLMIUM LASER APPLICATION;  Surgeon: Franchot Gallo, MD;  Location: [facility];  Service: Urology;;   HOLMIUM LASER APPLICATION Left 1/75/1025   Procedure: HOLMIUM LASER APPLICATION;  Surgeon: Franchot Gallo, MD;  Location: [facility];  Service: Urology;  Laterality: Left;   MASS EXCISION Left 03/04/2015   Procedure: EXCISION OF SOFT TISSUE NEOPLASM LEFT ARM;  Surgeon: Aviva Signs, MD;  Location: [facility];  Service: General;  Laterality: Left;   PERCUTANEOUS NEPHROSTOLITHOTOMY Bilateral 12/  2011     [facility]   POLYPECTOMY N/A 03/17/2013   Procedure: GASTRIC POLYPECTOMY;  Surgeon: Danie Binder, MD;  [facility];  Service: Endoscopy;  Laterality: N/A;   POLYPECTOMY  02/26/2017   Procedure: POLYPECTOMY;  Surgeon: Danie Binder, MD;  [facility];  Service: Endoscopy;;  polyp at cecum, ascending colon polyps x3, hepatic flexure polyps x8, transverse colon polyps x8    POLYPECTOMY  09/22/2019   Procedure: POLYPECTOMY;  Surgeon: Danie Binder, MD;  [facility];  Service: Endoscopy;;   REMOVAL RIGHT THIGH CYST  2006   SAVORY DILATION N/A 03/17/2013   Procedure: SAVORY DILATION;  Surgeon: Danie Binder, MD;  [facility];  Service: Endoscopy;  Laterality: N/A;  #12.8, 14, 15, 16 dilators used   SAVORY DILATION N/A 06/10/2015   Procedure: SAVORY DILATION;  Surgeon: Danie Binder, MD;  [facility];  Service: Endoscopy;  Laterality: N/A;   TRANSTHORACIC ECHOCARDIOGRAM  01-01-2014   mild LVH/  ef 85-27%/  grade I diastolic dysfunction/  trivial MR, TR, and PR    VAGINAL HYSTERECTOMY  1970's     SOCIAL HISTORY:  Social History   Socioeconomic History   Marital status: Divorced    Spouse name: Not on file   Number of children: 2   Years of education: Not on file   Highest education level: Not on file  Occupational History   Occupation: retired   Tobacco Use   Smoking status: Former    Packs/day: 1.00    Years: 20.00    Pack years: 20.00    Types: Cigarettes    Start date: 09/21/1974    Quit date: 09/20/1988    Years since quitting: 32.6   Smokeless tobacco: Never   Tobacco comments:    Quit x 20 years  Vaping Use   Vaping Use: Never used  Substance and Sexual Activity   Alcohol use: No    Alcohol/week: 0.0 standard drinks   Drug use: No   Sexual activity: Never    Birth control/protection: Surgical  Other Topics Concern   Not on file  Social History Narrative   Patient is right handed   Patient drinks some caffeine daily.   Social Determinants of Health   Financial Resource Strain: Low Risk    Difficulty of Paying Living Expenses: Not hard at all  Food Insecurity: No Food Insecurity   Worried About Charity fundraiser in the Last Year: Never true   [place] in the Last Year: Never true  Transportation Needs: No Transportation Needs   Lack of Transportation (Medical): No   Lack of Transportation (Non-Medical): No  Physical Activity: Inactive   Days of Exercise per Week: 0 days   Minutes of Exercise per Session: 0 min  Stress: No Stress Concern Present   Feeling of Stress : Not at all  Social Connections: Moderately Isolated   Frequency of Communication with Friends and Family: More than three times a week   Frequency of Social Gatherings with Friends and Family: Never   Attends Religious Services: More than 4 times per year   Active Member of [employer] or Organizations: No   Attends Music therapist: Never   Marital Status: Divorced  Human resources officer Violence: Not At Risk   Fear of Current or  Ex-Partner: No   Emotionally Abused: No   Physically Abused: No   Sexually Abused: No    FAMILY HISTORY:  Family History  Problem Relation Age of Onset   Hypertension Mother    Diabetes Mother    Heart failure Mother    Dementia Mother    Emphysema Father    Hypertension Father    Diabetes Brother    GER disease Brother    Hypertension Sister    Hypertension Sister    Cancer Other        family history    Diabetes Other        family history    Heart defect Other        famiily history    Arthritis Other        family history    Anesthesia problems Neg Hx    Hypotension Neg Hx    Malignant hyperthermia Neg Hx    Pseudochol deficiency Neg Hx    Colon cancer Neg Hx     CURRENT MEDICATIONS:  Outpatient Encounter Medications as of 05/18/2021  Medication Sig   acetaminophen (TYLENOL) 500 MG tablet Take 1,000 mg by mouth every 6 (six) hours as needed for mild pain.   amLODipine (NORVASC) 10 MG tablet TAKE 1 TABLET BY MOUTH EVERY DAY   budesonide-formoterol (SYMBICORT) 80-4.5 MCG/ACT inhaler INHALE 2 PUFFS INTO THE LUNGS TWICE A DAY (Patient taking differently: Inhale 2 puffs into the lungs 2 (two) times daily.)   diclofenac Sodium (VOLTAREN) 1 % GEL Apply 4 g topically 4 (four) times daily as needed (pain).   diphenhydramine-acetaminophen (TYLENOL PM) 25-500 MG TABS tablet Take 2 tablets by mouth at bedtime.   Dulaglutide (TRULICITY) 1.61 WR/6.0AV SOPN Inject 0.75 mg into the skin once a week for 28 days.   gabapentin (NEURONTIN) 300 MG capsule Take 300 mg by mouth daily.   glucose blood (ONETOUCH ULTRA) test strip Use as instructed once daily dx W09.8   OneTouch Delica Lancets 11B MISC Once daily testing dx e11.9   pantoprazole (PROTONIX) 40 MG tablet TAKE 1 TABLET BY MOUTH TWICE A DAY   PARoxetine (PAXIL) 40 MG tablet TAKE 1 TABLET BY MOUTH EVERY DAY IN THE MORNING   rosuvastatin (CRESTOR)  20 MG tablet TAKE 1 TABLET BY MOUTH EVERY DAY   traMADol (ULTRAM) 50 MG tablet Take  1 tablet (50 mg total) by mouth every 6 (six) hours as needed.   UNABLE TO FIND Diabetic shoes x 1 pair, inserts x 3 pair  DX E11.9   No facility-administered encounter medications on file as of 05/18/2021.    ALLERGIES:  Allergies  Allergen Reactions   Ace Inhibitors Cough    Patient doesn't recall   Keflex [Cephalexin] Diarrhea and Nausea And Vomiting   Nitrofurantoin Diarrhea and Nausea And Vomiting   Penicillins Hives and Other (See Comments)    Reaction:  Blisters on hands/feet  Has patient had a PCN reaction causing immediate rash, facial/tongue/throat swelling, SOB or lightheadedness with hypotension: No Has patient had a PCN reaction causing severe rash involving mucus membranes or skin necrosis: No Has patient had a PCN reaction that required hospitalization No Has patient had a PCN reaction occurring within the last 10 years: No If all of the above answers are "NO", then may proceed with Cephalosporin use.     PHYSICAL EXAM:  ECOG PERFORMANCE STATUS: 1 - Symptomatic but completely ambulatory  There were no vitals filed for this visit. There were no vitals filed for this visit. Physical Exam Constitutional:      Appearance: Normal appearance. She is obese.  HENT:     Head: Normocephalic and atraumatic.     Mouth/Throat:     Mouth: Mucous membranes are moist.  Eyes:     Extraocular Movements: Extraocular movements intact.     Pupils: Pupils are equal, round, and reactive to light.  Cardiovascular:     Rate and Rhythm: Normal rate and regular rhythm.     Pulses: Normal pulses.     Heart sounds: Normal heart sounds.  Pulmonary:     Effort: Pulmonary effort is normal.     Breath sounds: Normal breath sounds.  Abdominal:     General: Bowel sounds are normal.     Palpations: Abdomen is soft.     Tenderness: There is no abdominal tenderness.  Musculoskeletal:        General: No swelling.     Right lower leg: No edema.     Left lower leg: No edema.   Lymphadenopathy:     Cervical: No cervical adenopathy.  Skin:    General: Skin is warm and dry.  Neurological:     General: No focal deficit present.     Mental Status: She is alert and oriented to person, place, and time.  Psychiatric:        Mood and Affect: Mood normal.        Behavior: Behavior normal.     LABORATORY DATA:  I have reviewed the labs as listed.  CBC    Component Value Date/Time   WBC 7.8 05/11/2021 1230   RBC 4.34 05/11/2021 1230   HGB 13.7 05/11/2021 1230   HCT 42.1 05/11/2021 1230   PLT 290 05/11/2021 1230   MCV 97.0 05/11/2021 1230   MCH 31.6 05/11/2021 1230   MCHC 32.5 05/11/2021 1230   RDW 14.4 05/11/2021 1230   LYMPHSABS 1.6 05/11/2021 1230   MONOABS 0.6 05/11/2021 1230   EOSABS 0.2 05/11/2021 1230   BASOSABS 0.0 05/11/2021 1230   CMP Latest Ref Rng & Units 05/11/2021 01/31/2021 11/29/2020  Glucose 70 - 99 mg/dL 125(H) 60(L) 143(H)  BUN 8 - 23 mg/dL _0 Creatinine 0.44 - 1.00 mg/dL 1.83(H) 1.77(H) 2.13(H)  Sodium 135 - 145 mmol/L 138 136 135  Potassium 3.5 - 5.1 mmol/L 3.7 4.0 3.1(L)  Chloride 98 - 111 mmol/L 102 103 98  CO2 22 - 32 mmol/L _0 Calcium 8.9 - 10.3 mg/dL 10.4(H) 9.8 10.4(H)  Total Protein 6.5 - 8.1 g/dL 8.9(H) 9.0(H) -  Total Bilirubin 0.3 - 1.2 mg/dL 0.4 0.4 -  Alkaline Phos 38 - 126 U/L 90 90 -  AST 15 - 41 U/L 16 18 -  ALT 0 - 44 U/L 15 17 -    DIAGNOSTIC IMAGING:  I have independently reviewed the relevant imaging and discussed with the patient.  ASSESSMENT & PLAN: 1.  IgG kappa smoldering myeloma: - Diagnosed in September 2018 after nephrology work-up found M spike in urine (8.3 mg per 24 hours) and serum (1.5 g/dL) - Immunofixation shows IgG monoclonal protein with kappa light chain specificity - Bone marrow biopsy on 02/19/2017 showed trilineage hematopoiesis, plasma cells 14%, cytogenetics normal, FISH panel normal - Skeletal survey on 02/01/2020 did not show any lytic lesions. - Skeletal survey  (02/16/2021): 2 adjacent 4 mm lucencies on the right scapula at the base of the glenoid, which were not seen on prior exam.  No other focal bone lesion. - Scapula x-ray (02/21/2021): Small lucent lesions in the scapula at the base of the glenoid, better seen on skeletal survey - Most recent myeloma panel (05/11/2021): M spike stable at 1.2; elevated kappa light chains at 65.6, normal lambda 14.8, elevated ratio 4.43 (higher than usual baseline).  LDH normal. 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  Chronic right leg pain. - Per [facility] 20/20/2 guidelines, patient is not high risk SMM, no treatment indicated at this time, only continued close observation - PLAN: Repeat myeloma panel and RTC in 3 months   - Repeat scapula x-ray in 3 months.  Repeat skeletal survey in 6 months.   2.  Normocytic anemia with iron deficiency: - She had a colonoscopy on 09/22/2019 which showed polyps, diverticulosis, and external and internal hemorrhoids.  EGD (02/26/2017) with small hiatal hernia and mild gastritis. - Has required intermittent IV iron infusions (unable to tolerate oral iron due to severe constipation); last IV iron in April 2022 - Most recent labs (05/11/2021): Hgb 13.7, ferritin 25, iron saturation 17% - Symptomatic with chronic fatigue and dyspnea on exertion - PLAN: Due to ferritin < 50 and iron saturation < 20%, recommend IV iron with Feraheme x2.  We will repeat labs in 3 months and reassess at return visit.  3.  Constipation - No bowel movement x6 days - PLAN: Prescription sent to pharmacy for lactulose and Dulcolax suppository, with instructions provided to patient.  Recommend continued follow-up with PCP and/or GI at the discretion of PCP.  PLAN SUMMARY & DISPOSITION: Feraheme x2 Labs in 3 months PLUS x-ray of right scapula Office visit after labs/x-ray  All questions were answered. The patient  knows to call the clinic with any problems, questions or concerns.  Medical decision making: Moderate  Time spent on visit: I spent 20 minutes counseling the patient face to face. The total time spent in the appointment was 30 minutes and more than 50% was on counseling.   Harriett Rush, PA-C  05/18/2021 12:50 PM

## 2021-05-17 NOTE — Telephone Encounter (Signed)
Sent in a refill.

## 2021-05-17 NOTE — Telephone Encounter (Signed)
Patient called need refill, requesting 1 extra needle because patient broke one of her other needles.  Dulaglutide (TRULICITY) 7.47 VE/5.5MZ [facility]   Pharmacy [facility]

## 2021-05-18 ENCOUNTER — Inpatient Hospital Stay (HOSPITAL_BASED_OUTPATIENT_CLINIC_OR_DEPARTMENT_OTHER): Payer: Medicare Other | Admitting: Physician Assistant

## 2021-05-18 ENCOUNTER — Encounter (HOSPITAL_COMMUNITY): Payer: Self-pay | Admitting: Physician Assistant

## 2021-05-18 ENCOUNTER — Other Ambulatory Visit: Payer: Self-pay

## 2021-05-18 VITALS — BP 141/76 | HR 99 | Temp 97.6°F | Resp 20 | Ht 66.5 in | Wt 190.9 lb

## 2021-05-18 DIAGNOSIS — E119 Type 2 diabetes mellitus without complications: Secondary | ICD-10-CM | POA: Diagnosis not present

## 2021-05-18 DIAGNOSIS — Z86010 Personal history of colonic polyps: Secondary | ICD-10-CM | POA: Diagnosis not present

## 2021-05-18 DIAGNOSIS — C9000 Multiple myeloma not having achieved remission: Secondary | ICD-10-CM | POA: Diagnosis not present

## 2021-05-18 DIAGNOSIS — D472 Monoclonal gammopathy: Secondary | ICD-10-CM | POA: Diagnosis not present

## 2021-05-18 DIAGNOSIS — R200 Anesthesia of skin: Secondary | ICD-10-CM | POA: Diagnosis not present

## 2021-05-18 DIAGNOSIS — D509 Iron deficiency anemia, unspecified: Secondary | ICD-10-CM | POA: Diagnosis not present

## 2021-05-18 DIAGNOSIS — M129 Arthropathy, unspecified: Secondary | ICD-10-CM | POA: Diagnosis not present

## 2021-05-18 DIAGNOSIS — Z79899 Other long term (current) drug therapy: Secondary | ICD-10-CM | POA: Diagnosis not present

## 2021-05-18 DIAGNOSIS — Z8673 Personal history of transient ischemic attack (TIA), and cerebral infarction without residual deficits: Secondary | ICD-10-CM | POA: Diagnosis not present

## 2021-05-18 DIAGNOSIS — R5383 Other fatigue: Secondary | ICD-10-CM | POA: Diagnosis not present

## 2021-05-18 DIAGNOSIS — K219 Gastro-esophageal reflux disease without esophagitis: Secondary | ICD-10-CM | POA: Diagnosis not present

## 2021-05-18 DIAGNOSIS — K5900 Constipation, unspecified: Secondary | ICD-10-CM

## 2021-05-18 MED ORDER — LACTULOSE 10 GM/15ML PO SOLN: 20.0000 g | Freq: Three times a day (TID) | ORAL | 0 refills | Status: DC

## 2021-05-18 MED ORDER — DOCUSATE SODIUM 100 MG PO CAPS: 100.0000 mg | ORAL_CAPSULE | Freq: Two times a day (BID) | ORAL | 0 refills | Status: DC

## 2021-05-18 MED ORDER — BISACODYL 10 MG RE SUPP: 10.0000 mg | RECTAL | 0 refills | Status: DC | PRN

## 2021-05-18 NOTE — Patient Instructions (Signed)
[place] at [facility] Discharge Instructions  You were seen today by Tarri Abernethy PA-C for your iron deficiency anemia and your smoldering myeloma.  IRON DEFICIENCY: Your iron levels are low today, so I recommend treatment with IV iron (Feraheme) x2 doses.  We will recheck your levels at your follow-up visit in 3 months.  SMOLDERING MYELOMA: You continue to be in the "gray area" between normal blood and full-blown blood cancer (multiple myeloma).  We will continue to monitor your labs closely, and we will see you for follow-up in 3 months with a full lab panel as well as repeat x-rays of your right shoulder due to the spots we saw on your scapula (right shoulder blade).  CONSTIPATION: Since you have not had a bowel movement in 6 days and having significant symptoms of your constipation, we will prescribe the following regimen. - Lactulose: Take lactulose 30 mL every 3 hours x 3 doses.  If you have not had a bowel movement by tomorrow, you can repeat this for another day. - Take Dulcolax suppository x1 today. - After you have had a good bowel movement and your current constipation has resolved, you can take Colace daily as an ongoing stool softener. - Please continue to follow-up with your primary care provider regarding ongoing management of your constipation.  LABS: Return in 3 months for repeat labs  OTHER TESTS: X-ray of right shoulder blade in 3 months  TREATMENT: IV Feraheme (iron infusion) x2 doses  FOLLOW-UP APPOINTMENT: Office visit in 3 months, after labs and x-ray   Thank you for choosing [place] at [facility] to provide your oncology and hematology care.  To afford each patient quality time with our provider, please arrive at least 15 minutes before your scheduled appointment time.   If you have a lab appointment with the [facility] please come in thru the Main Entrance and check in at the main information  desk.  You need to re-schedule your appointment should you arrive 10 or more minutes late.  We strive to give you quality time with our providers, and arriving late affects you and other patients whose appointments are after yours.  Also, if you no show three or more times for appointments you may be dismissed from the clinic at the providers discretion.     Again, thank you for choosing [facility].  Our hope is that these requests will decrease the amount of time that you wait before being seen by our physicians.       _____________________________________________________________  Should you have questions after your visit to [facility], please contact our office at [phone] and follow the prompts.  Our office hours are 8:00 a.m. and 4:30 p.m. Monday - Friday.  Please note that voicemails left after 4:00 p.m. may not be returned until the following business day.  We are closed weekends and major holidays.  You do have access to a nurse 24-7, just call the main number to the clinic [phone] and do not press any options, hold on the line and a nurse will answer the phone.    For prescription refill requests, have your pharmacy contact our office and allow 72 hours.    Due to Covid, you will need to wear a mask upon entering the hospital. If you do not have a mask, a mask will be given to you at the Main Entrance upon arrival. For doctor visits, patients may have  1 support person age 75 or older with them. For treatment visits, patients can not have anyone with them due to social distancing guidelines and our immunocompromised population.

## 2021-05-19 DIAGNOSIS — R0902 Hypoxemia: Secondary | ICD-10-CM | POA: Diagnosis not present

## 2021-05-22 ENCOUNTER — Encounter (HOSPITAL_COMMUNITY): Payer: Self-pay

## 2021-05-22 ENCOUNTER — Inpatient Hospital Stay (HOSPITAL_COMMUNITY): Payer: Medicare Other

## 2021-05-22 ENCOUNTER — Other Ambulatory Visit: Payer: Self-pay

## 2021-05-22 VITALS — BP 119/77 | HR 94 | Temp 98.8°F | Resp 18

## 2021-05-22 DIAGNOSIS — D509 Iron deficiency anemia, unspecified: Secondary | ICD-10-CM | POA: Diagnosis not present

## 2021-05-22 DIAGNOSIS — K219 Gastro-esophageal reflux disease without esophagitis: Secondary | ICD-10-CM | POA: Diagnosis not present

## 2021-05-22 DIAGNOSIS — Z79899 Other long term (current) drug therapy: Secondary | ICD-10-CM | POA: Diagnosis not present

## 2021-05-22 DIAGNOSIS — M129 Arthropathy, unspecified: Secondary | ICD-10-CM | POA: Diagnosis not present

## 2021-05-22 DIAGNOSIS — Z8673 Personal history of transient ischemic attack (TIA), and cerebral infarction without residual deficits: Secondary | ICD-10-CM | POA: Diagnosis not present

## 2021-05-22 DIAGNOSIS — R200 Anesthesia of skin: Secondary | ICD-10-CM | POA: Diagnosis not present

## 2021-05-22 DIAGNOSIS — E119 Type 2 diabetes mellitus without complications: Secondary | ICD-10-CM | POA: Diagnosis not present

## 2021-05-22 DIAGNOSIS — C9000 Multiple myeloma not having achieved remission: Secondary | ICD-10-CM | POA: Diagnosis not present

## 2021-05-22 DIAGNOSIS — R5383 Other fatigue: Secondary | ICD-10-CM | POA: Diagnosis not present

## 2021-05-22 DIAGNOSIS — K5900 Constipation, unspecified: Secondary | ICD-10-CM | POA: Diagnosis not present

## 2021-05-22 DIAGNOSIS — Z86010 Personal history of colonic polyps: Secondary | ICD-10-CM | POA: Diagnosis not present

## 2021-05-22 DIAGNOSIS — D508 Other iron deficiency anemias: Secondary | ICD-10-CM

## 2021-05-22 MED ORDER — LORATADINE 10 MG PO TABS
10.0000 mg | ORAL_TABLET | Freq: Once | ORAL | Status: AC
  Administered 2021-05-22: 10 mg via ORAL
  Filled 2021-05-22: qty 1

## 2021-05-22 MED ORDER — ACETAMINOPHEN 325 MG PO TABS
650.0000 mg | ORAL_TABLET | Freq: Once | ORAL | Status: AC
  Administered 2021-05-22: 650 mg via ORAL
  Filled 2021-05-22: qty 2

## 2021-05-22 MED ORDER — SODIUM CHLORIDE 0.9 % IV SOLN
510.0000 mg | Freq: Once | INTRAVENOUS | Status: AC
  Administered 2021-05-22: 510 mg via INTRAVENOUS
  Filled 2021-05-22: qty 510

## 2021-05-22 MED ORDER — SODIUM CHLORIDE 0.9 % IV SOLN: Freq: Once | INTRAVENOUS | Status: AC

## 2021-05-22 NOTE — Patient Instructions (Signed)
[facility]  Discharge Instructions: ?Thank you for choosing [facility] to provide your oncology and hematology care.  ?If you have a lab appointment with the Cancer Center, please come in thru the Main Entrance and check in at the main information desk. ? ?Wear comfortable clothing and clothing appropriate for easy access to any Portacath or PICC line.  ? ?We strive to give you quality time with your provider. You may need to reschedule your appointment if you arrive late (15 or more minutes).  Arriving late affects you and other patients whose appointments are after yours.  Also, if you miss three or more appointments without notifying the office, you may be dismissed from the clinic at the provider?s discretion.    ?  ?For prescription refill requests, have your pharmacy contact our office and allow 72 hours for refills to be completed.   ? ?Today you received the following Fera[place]me, return as scheduled. ?  ?To help prevent nausea and vomiting after your treatment, we encourage you to take your nausea medication as directed. ? ?BELOW ARE SYMPTOMS THAT SHOULD BE REPORTED IMMEDIATELY: ?*FEVER GREATER THAN 100.4 F (38 ?C) OR HIGHER ?*CHILLS OR SWEATING ?*NAUSEA AND VOMITING THAT IS NOT CONTROLLED WITH YOUR NAUSEA MEDICATION ?*UNUSUAL SHORTNESS OF BREATH ?*UNUSUAL BRUISING OR BLEEDING ?*URINARY PROBLEMS (pain or burning when urinating, or frequent urination) ?*BOWEL PROBLEMS (unusual diarrhea, constipation, pain near the anus) ?TENDERNESS IN MOUTH AND THROAT WITH OR WITHOUT PRESENCE OF ULCERS (sore throat, sores in mouth, or a toothache) ?UNUSUAL RASH, SWELLING OR PAIN  ?UNUSUAL VAGINAL DISCHARGE OR ITCHING  ? ?Items with * indicate a potential emergency and should be followed up as soon as possible or go to the Emergency Department if any problems should occur. ? ?Please show the CHEMOTHERAPY ALERT CARD or IMMUNOTHERAPY ALERT CARD at check-in to the Emergency Department and triage  nurse. ? ?Should you have questions after your visit or need to cancel or reschedule your appointment, please contact [facility] [phone]  and follow the prompts.  Office hours are 8:00 a.m. to 4:30 p.m. Monday - Friday. Please note that voicemails left after 4:00 p.m. may not be returned until the following business day.  We are closed weekends and major holidays. You have access to a nurse at all times for urgent questions. Please call the main number to the clinic [phone] and follow the prompts. ? ?For any non-urgent questions, you may also contact your provider using MyChart. We now offer e-Visits for anyone 18 and older to request care online for non-urgent symptoms. For details visit [email or web address]. ?  ?Also download the MyChart app! Go to the app store, search "MyChart", open the app, select [facility], and log in with your MyChart username and password. ? ?Due to Covid, a mask is required upon entering the hospital/clinic. If you do not have a mask, one will be given to you upon arrival. For doctor visits, patients may have 1 support person aged 18 or older with them. For treatment visits, patients cannot have anyone with them due to current Covid guidelines and our immunocompromised population.  ?

## 2021-05-22 NOTE — Progress Notes (Signed)
Patient tolerated iron infusion with no complaints voiced. Peripheral IV site clean and dry with good blood return noted before and after infusion. Band aid applied. Patient declined AVS. VSS with discharge and left in satisfactory condition with no s/s of distress noted.

## 2021-05-29 ENCOUNTER — Other Ambulatory Visit: Payer: Self-pay

## 2021-05-29 ENCOUNTER — Other Ambulatory Visit: Payer: Self-pay | Admitting: Family Medicine

## 2021-05-29 ENCOUNTER — Ambulatory Visit (HOSPITAL_COMMUNITY)
Admission: RE | Admit: 2021-05-29 | Discharge: 2021-05-29 | Disposition: A | Discharge status: Home / Self Care | Payer: Medicare Other | Source: Ambulatory Visit | Attending: Family Medicine | Admitting: Family Medicine

## 2021-05-29 DIAGNOSIS — R140 Abdominal distension (gaseous): Secondary | ICD-10-CM | POA: Insufficient documentation

## 2021-05-29 DIAGNOSIS — N132 Hydronephrosis with renal and ureteral calculous obstruction: Secondary | ICD-10-CM | POA: Diagnosis not present

## 2021-05-29 DIAGNOSIS — I700 Atherosclerosis of aorta: Secondary | ICD-10-CM | POA: Diagnosis not present

## 2021-05-29 DIAGNOSIS — K5730 Diverticulosis of large intestine without perforation or abscess without bleeding: Secondary | ICD-10-CM | POA: Diagnosis not present

## 2021-05-30 ENCOUNTER — Ambulatory Visit (INDEPENDENT_AMBULATORY_CARE_PROVIDER_SITE_OTHER): Payer: Medicare Other | Admitting: Urology

## 2021-05-30 ENCOUNTER — Encounter: Payer: Self-pay | Admitting: Urology

## 2021-05-30 VITALS — BP 111/72 | HR 92 | Wt 190.0 lb

## 2021-05-30 DIAGNOSIS — N132 Hydronephrosis with renal and ureteral calculous obstruction: Secondary | ICD-10-CM | POA: Insufficient documentation

## 2021-05-30 DIAGNOSIS — N201 Calculus of ureter: Secondary | ICD-10-CM | POA: Insufficient documentation

## 2021-05-30 DIAGNOSIS — N200 Calculus of kidney: Secondary | ICD-10-CM | POA: Diagnosis not present

## 2021-05-30 LAB — URINALYSIS, ROUTINE W REFLEX MICROSCOPIC
Bilirubin, UA: NEGATIVE
Glucose, UA: NEGATIVE
Ketones, UA: NEGATIVE
Nitrite, UA: NEGATIVE
Protein,UA: NEGATIVE
RBC, UA: NEGATIVE
Specific Gravity, UA: 1.015 (ref 1.005–1.030)
Urobilinogen, Ur: 0.2 mg/dL (ref 0.2–1.0)
pH, UA: 6 (ref 5.0–7.5)

## 2021-05-30 LAB — MICROSCOPIC EXAMINATION
Epithelial Cells (non renal): >10 /hpf — AB (ref 0–10)
RBC, Urine: NONE SEEN /hpf (ref 0–2)
Renal Epithel, UA: NONE SEEN /hpf

## 2021-05-30 LAB — BLADDER SCAN AMB NON-IMAGING: Scan Result: 86

## 2021-05-30 MED ORDER — TAMSULOSIN HCL 0.4 MG PO CAPS: 0.4000 mg | ORAL_CAPSULE | Freq: Every day | ORAL | 0 refills | Status: DC

## 2021-05-30 NOTE — Progress Notes (Signed)
Urological Symptom Review  Patient is experiencing the following symptoms: Hard to postpone urination Get up at night to urinate Leakage of urine Weak stream   Review of Systems  Gastrointestinal (upper)  : Negative for upper GI symptoms  Gastrointestinal (lower) : Constipation  Constitutional : Negative for symptoms  Skin: Negative for skin symptoms  Eyes: Negative for eye symptoms  Ear/Nose/Throat : Negative for Ear/Nose/Throat symptoms  Hematologic/Lymphatic: Easy bruising  Cardiovascular : Leg swelling  Respiratory : Shortness of breath  Endocrine: Excessive thirst  Musculoskeletal: Joint pain  Neurological: Headaches  Psychologic: Depression Anxiety

## 2021-05-30 NOTE — Progress Notes (Signed)
post void residual 65ml

## 2021-05-30 NOTE — Progress Notes (Signed)
History of Present Illness: Cheryl Abbott is a 75 y.o. year old female here for follow-up of urolithiasis. Pt c/o decreased urine output x 2 days with increase in nighttime voiding. She c/o abdominal fullness and bloating for a few months and had eval with her PCP prompting CTU yesterday. Imaging indicated left sided ureteral stone with moderate hydroureteronephrosis. Images reviewed personally as well. Pt denies abdominal pain, flank pain, hematuria, urinary burning, dysuria. No fever, chills, nausea, vomiting. Recent constipation sxs relieved following bowel regimen rx by her PCP. Bladder scan before voiding=31ml UA negative  02/28/21 She presents for follow-up.  History of recent ureteroscopy with holmium laser and extraction of left renal calculi in May, 2022.  Because of recurrent infections it took a while to have her come back for stent extraction which was done about a month and a half ago.  She has had no left flank pain, gross hematuria or dysuria since that time.    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 9     History of adenomatous polyp of colon    2008   History of kidney stones    Hypercalcemia    Hypercalcemia 11/30/2010   Hyperlipidemia    Hypertension    Lactose intolerance in adult 02/01/2016   Left ureteral stone    Lipoma of arm 01/19/2015   Lumbar disc disease with radiculopathy    Nephrolithiasis    Neuropathic pain of both feet 04/11/2016   Neuropathy, peripheral    Nocturnal hypoxia 02/04/2014   Study confirms this done 02/10/2014    OSA treated with BiPAP    per study 2007   RENAL CALCULUS 03/15/2009   Qualifier: Diagnosis of   By: Zeb Comfort     Sciatica    Shoulder pain, bilateral    Sigmoid diverticulosis    TIA (transient ischemic attack) 02/26/2016   Type 2 diabetes mellitus ([place])    Vaginal dryness, menopausal 10/14/2016    Past Surgical History:  Procedure Laterality Date   BIOPSY N/A 03/17/2013   Procedure: GASTRIC BIOPSIES;  Surgeon: Danie Binder, MD;  Location: [facility];  Service: Endoscopy;  Laterality: N/A;   BIOPSY  06/10/2015   Procedure: BIOPSY;  Surgeon: Danie Binder, MD;  [facility];  Service: Endoscopy;;  gastric biopsy   CARDIAC CATHETERIZATION  05-11-2003  dr Shelva Majestic ([facility])   Abnormal cardiolite/   Normal coronary arteries and normal LVF,  ef  63%   CARDIOVASCULAR STRESS TEST  01-01-2014   normal lexiscan cardiolite/  no ischemia/ infarct/  normal LV function and wall motion ,  ef 81%   COLONOSCOPY  last one 10/02/2011   MOD [place] TICS, IH, NEXT TCS APR 2018   COLONOSCOPY WITH PROPOFOL N/A 02/26/2017   Procedure: COLONOSCOPY WITH PROPOFOL;  Surgeon: Danie Binder, MD;  [facility];  Service: Endoscopy;  Laterality: N/A;  11:00am   COLONOSCOPY WITH PROPOFOL N/A 09/22/2019   Procedure: COLONOSCOPY WITH PROPOFOL;  Surgeon: Danie Binder, MD;  [facility];  Service: Endoscopy;  Laterality: N/A;  1:15   CYSTO/   RIGHT URETEROSOCOPY LASER LITHOTRIPSY STONE EXTRACTION  10-13-2004   CYSTO/  RIGHT RETROGRADE PYELOGRAM/  PLACMENT RIGHT URETERAL STENT  01-10-2010   CYSTOSCOPY W/ URETERAL STENT PLACEMENT Right 08/19/2015   Procedure: CYSTOSCOPY WITH RETROGRADE PYELOGRAM/URETERAL STENT PLACEMENT;  Surgeon: Franchot Gallo, MD;  [facility]: [facility];  Service: Urology;  Laterality: Right;   CYSTOSCOPY W/ URETERAL STENT PLACEMENT Left 02/23/2020   Procedure: CYSTOSCOPY LEFT  RETROGRADE PYELOGRAM LEFT URETEROSCOPY URETERAL STENT PLACEMENT;  Surgeon: Franchot Gallo, MD;  [facility]: [facility];  Service: Urology;  Laterality: Left;   CYSTOSCOPY WITH  RETROGRADE PYELOGRAM, URETEROSCOPY AND STENT PLACEMENT Left 10/21/2014   Procedure: CYSTOSCOPY WITH RETROGRADE PYELOGRAM, URETEROSCOPY AND STENT PLACEMENT;  Surgeon: Franchot Gallo, MD;  Location: [facility];  Service: Urology;  Laterality: Left;   CYSTOSCOPY WITH RETROGRADE PYELOGRAM, URETEROSCOPY AND STENT PLACEMENT Right 11/22/2015   Procedure: CYSTOSCOPY, RIGHT URETERAL STENT REMOVAL; RIGHT RETROGRADE PYELOGRAM, RIGHT URETEROSCOPY WITH BALLOON DILATION; RIGHT URETERAL STENT PLACEMENT;  Surgeon: Franchot Gallo, MD;  [facility];  Service: Urology;  Laterality: Right;   CYSTOSCOPY WITH RETROGRADE PYELOGRAM, URETEROSCOPY AND STENT PLACEMENT Left 10/18/2020   Procedure: CYSTOSCOPY WITH LEFT RETROGRADE PYELOGRAM, LEFT URETEROSCOPY  WITH LASER AND LEFT URETERAL STENT PLACEMENT;  Surgeon: Franchot Gallo, MD;  [facility];  Service: Urology;  Laterality: Left;   CYSTOSCOPY/RETROGRADE/URETEROSCOPY/STONE EXTRACTION WITH BASKET Bilateral 08/02/2015   Procedure: CYSTOSCOPY; BILATERAL RETROGRADE PYELOGRAMS; BILATERAL URETEROSCOPY, STONE EXTRACTION WITH BASKET;  Surgeon: Franchot Gallo, MD;  [facility];  Service: Urology;  Laterality: Bilateral;   CYSTOSCOPY/RETROGRADE/URETEROSCOPY/STONE EXTRACTION WITH BASKET Right 06/28/2015   Procedure: CYSTOSCOPY/RETROGRADE/URETEROSCOPY/STONE EXTRACTION WITH BASKET, RIGHT URETERAL DOUBLE J STENT PLACEMENT;  Surgeon: Franchot Gallo, MD;  [facility];  Service: Urology;  Laterality: Right;   ESOPHAGOGASTRODUODENOSCOPY (EGD) WITH PROPOFOL N/A 03/17/2013   Procedure: ESOPHAGOGASTRODUODENOSCOPY (EGD) WITH PROPOFOL;  Surgeon: Danie Binder, MD;  [facility];  Service: Endoscopy;  Laterality: N/A;   ESOPHAGOGASTRODUODENOSCOPY (EGD) WITH PROPOFOL N/A 06/10/2015   Distal gastritis, distal esophageal stricture s/p dilation   ESOPHAGOGASTRODUODENOSCOPY (EGD) WITH PROPOFOL N/A 02/26/2017   Procedure: ESOPHAGOGASTRODUODENOSCOPY (EGD)  WITH PROPOFOL;  Surgeon: Danie Binder, MD;  [facility];  Service: Endoscopy;  Laterality: N/A;   FLEXIBLE SIGMOIDOSCOPY  09/11/2011   [id number] Internal hemorrhoids   HOLMIUM LASER APPLICATION Left 5/46/5681   Procedure: HOLMIUM LASER APPLICATION;  Surgeon: Franchot Gallo, MD;  Location: [facility];  Service: Urology;  Laterality: Left;   HOLMIUM LASER APPLICATION Right 2/75/1700   Procedure: HOLMIUM LASER APPLICATION;  Surgeon: Franchot Gallo, MD;  [facility];  Service: Urology;  Laterality: Right;   HOLMIUM LASER APPLICATION  1/74/9449   Procedure: HOLMIUM LASER APPLICATION;  Surgeon: Franchot Gallo, MD;  [facility];  Service: Urology;;   HOLMIUM LASER APPLICATION Left 6/75/9163   Procedure: HOLMIUM LASER APPLICATION;  Surgeon: Franchot Gallo, MD;  [facility];  Service: Urology;  Laterality: Left;   MASS EXCISION Left 03/04/2015   Procedure: EXCISION OF SOFT TISSUE NEOPLASM LEFT ARM;  Surgeon: Aviva Signs, MD;  Location: [facility];  Service: General;  Laterality: Left;   PERCUTANEOUS NEPHROSTOLITHOTOMY Bilateral 12/  2011     [facility]   POLYPECTOMY N/A 03/17/2013   Procedure: GASTRIC POLYPECTOMY;  Surgeon: Danie Binder, MD;  [facility];  Service: Endoscopy;  Laterality: N/A;   POLYPECTOMY  02/26/2017   Procedure: POLYPECTOMY;  Surgeon: Danie Binder, MD;  [facility];  Service: Endoscopy;;  polyp at cecum, ascending colon polyps x3, hepatic flexure polyps x8, transverse colon polyps x8    POLYPECTOMY  09/22/2019   Procedure: POLYPECTOMY;  Surgeon: Danie Binder, MD;  [facility];  Service: Endoscopy;;   REMOVAL RIGHT THIGH CYST  2006   SAVORY DILATION N/A 03/17/2013   Procedure: SAVORY DILATION;  Surgeon: Danie Binder, MD;  [facility];  Service: Endoscopy;  Laterality: N/A;  #12.8, 14, 15, 16 dilators used   SAVORY DILATION N/A 06/10/2015   Procedure: SAVORY DILATION;  Surgeon: Danie Binder, MD;  [facility];  Service: Endoscopy;  Laterality: N/A;   TRANSTHORACIC ECHOCARDIOGRAM  01-01-2014   mild LVH/  ef 70-17%/  grade I diastolic dysfunction/  trivial MR, TR, and PR   VAGINAL HYSTERECTOMY  1970's    Home Medications:  (Not in a hospital admission)   Allergies:  Allergies  Allergen Reactions   Ace Inhibitors Cough    Patient doesn't recall   Keflex [Cephalexin] Diarrhea and Nausea And Vomiting   Nitrofurantoin Diarrhea and Nausea And Vomiting   Penicillins Hives and Other (See Comments)    Reaction:  Blisters on hands/feet  Has patient had a PCN reaction causing immediate rash, facial/tongue/throat swelling, SOB or lightheadedness with hypotension: No Has patient had a PCN reaction causing severe rash involving mucus membranes or skin necrosis: No Has patient had a PCN reaction that required hospitalization No Has patient had a PCN reaction occurring within the last 10 years: No If all of the above answers are "NO", then may proceed with Cephalosporin use.    Family History  Problem Relation Age of Onset   Hypertension Mother    Diabetes Mother    Heart failure Mother    Dementia Mother    Emphysema Father    Hypertension Father    Diabetes Brother    GER disease Brother    Hypertension Sister    Hypertension Sister    Cancer Other        family history    Diabetes Other        family history    Heart defect Other        famiily history    Arthritis Other        family history    Anesthesia problems Neg Hx    Hypotension Neg Hx    Malignant hyperthermia Neg Hx    Pseudochol deficiency Neg Hx    Colon cancer Neg Hx     Social History:  reports that she quit smoking about 32 years ago. Her smoking use included cigarettes. She started smoking about 46 years ago. She has a 20.00 pack-year smoking history. She has never used smokeless tobacco. She reports that she does not drink alcohol and does not use drugs.  ROS: A complete  review of systems was performed.  All systems are negative except for pertinent findings as noted.  Physical Exam:  Blood pressure 111/72, pulse 92, weight 190 lb (86.2 kg).   General:  Alert and oriented, No acute distress HEENT: Normocephalic, atraumatic Neck: No JVD or lymphadenopathy Cardiovascular: Regular rate  Lungs: Normal inspiratory/expiratory excursion Abdomen: Soft, nontender, distended, no abdominal masses, no gaurding Back: No CVA tenderness Extremities: No edema Neurologic: Grossly intact  I have reviewed prior pt notes  I have reviewed notes from referring/previous physicians  I have reviewed urinalysis results  I have independently reviewed prior imaging  Impression/Assessment:  Diagnoses and all orders for this visit:  Calculus of ureter -     Urinalysis, Routine w reflex microscopic -     BLADDER SCAN AMB NON-IMAGING  Calculus of kidney -     BLADDER SCAN AMB NON-IMAGING  Hydronephrosis with urinary obstruction due to ureteral calculus  Other orders -     tamsulosin (FLOMAX) 0.4 MG CAPS capsule; Take 1 capsule (0.4 mg total) by mouth daily.   Plan:  Discussed findings and plan of care with Dr. Diona Fanti who agrees with Flomax, fluid intake, and time to pass left sided ureteral stone. Discussed at length with the pt who agrees with plan. She will FU in 2 weeks for recheck UA.Will strain urine. Pt will let us know if she develops pain, fever, nausea, vomiting, she will return or go to the ED as discussed.  Lillette Boxer Malcolm Hetz 05/30/2021, 11:51 AM  Lillette Boxer. Andruw Battie MD

## 2021-05-31 ENCOUNTER — Other Ambulatory Visit: Payer: Self-pay

## 2021-05-31 ENCOUNTER — Inpatient Hospital Stay (HOSPITAL_COMMUNITY): Payer: Medicare Other

## 2021-05-31 VITALS — BP 115/70 | HR 84 | Temp 97.2°F | Resp 18

## 2021-05-31 DIAGNOSIS — Z79899 Other long term (current) drug therapy: Secondary | ICD-10-CM | POA: Diagnosis not present

## 2021-05-31 DIAGNOSIS — K5900 Constipation, unspecified: Secondary | ICD-10-CM | POA: Diagnosis not present

## 2021-05-31 DIAGNOSIS — D508 Other iron deficiency anemias: Secondary | ICD-10-CM

## 2021-05-31 DIAGNOSIS — R5383 Other fatigue: Secondary | ICD-10-CM | POA: Diagnosis not present

## 2021-05-31 DIAGNOSIS — R200 Anesthesia of skin: Secondary | ICD-10-CM | POA: Diagnosis not present

## 2021-05-31 DIAGNOSIS — Z8673 Personal history of transient ischemic attack (TIA), and cerebral infarction without residual deficits: Secondary | ICD-10-CM | POA: Diagnosis not present

## 2021-05-31 DIAGNOSIS — D509 Iron deficiency anemia, unspecified: Secondary | ICD-10-CM

## 2021-05-31 DIAGNOSIS — K219 Gastro-esophageal reflux disease without esophagitis: Secondary | ICD-10-CM | POA: Diagnosis not present

## 2021-05-31 DIAGNOSIS — E119 Type 2 diabetes mellitus without complications: Secondary | ICD-10-CM | POA: Diagnosis not present

## 2021-05-31 DIAGNOSIS — Z86010 Personal history of colonic polyps: Secondary | ICD-10-CM | POA: Diagnosis not present

## 2021-05-31 DIAGNOSIS — C9000 Multiple myeloma not having achieved remission: Secondary | ICD-10-CM | POA: Diagnosis not present

## 2021-05-31 DIAGNOSIS — M129 Arthropathy, unspecified: Secondary | ICD-10-CM | POA: Diagnosis not present

## 2021-05-31 MED ORDER — ACETAMINOPHEN 325 MG PO TABS
650.0000 mg | ORAL_TABLET | Freq: Once | ORAL | Status: AC
  Administered 2021-05-31: 14:00:00 650 mg via ORAL
  Filled 2021-05-31: qty 2

## 2021-05-31 MED ORDER — LORATADINE 10 MG PO TABS
10.0000 mg | ORAL_TABLET | Freq: Once | ORAL | Status: AC
  Administered 2021-05-31: 14:00:00 10 mg via ORAL
  Filled 2021-05-31: qty 1

## 2021-05-31 MED ORDER — SODIUM CHLORIDE 0.9 % IV SOLN
510.0000 mg | Freq: Once | INTRAVENOUS | Status: AC
  Administered 2021-05-31: 15:00:00 510 mg via INTRAVENOUS
  Filled 2021-05-31: qty 510

## 2021-05-31 MED ORDER — SODIUM CHLORIDE 0.9 % IV SOLN: Freq: Once | INTRAVENOUS | Status: AC

## 2021-05-31 NOTE — Patient Instructions (Signed)
[facility]  Discharge Instructions: Thank you for choosing [facility] to provide your oncology and hematology care.  If you have a lab appointment with the Cancer Center, please come in thru the Main Entrance and check in at the main information desk.  Wear comfortable clothing and clothing appropriate for easy access to any Portacath or PICC line.   We strive to give you quality time with your provider. You may need to reschedule your appointment if you arrive late (15 or more minutes).  Arriving late affects you and other patients whose appointments are after yours.  Also, if you miss three or more appointments without notifying the office, you may be dismissed from the clinic at the provider's discretion.      For prescription refill requests, have your pharmacy contact our office and allow 72 hours for refills to be completed.        To help prevent nausea and vomiting after your treatment, we encourage you to take your nausea medication as directed.  BELOW ARE SYMPTOMS THAT SHOULD BE REPORTED IMMEDIATELY: *FEVER GREATER THAN 100.4 F (38 C) OR HIGHER *CHILLS OR SWEATING *NAUSEA AND VOMITING THAT IS NOT CONTROLLED WITH YOUR NAUSEA MEDICATION *UNUSUAL SHORTNESS OF BREATH *UNUSUAL BRUISING OR BLEEDING *URINARY PROBLEMS (pain or burning when urinating, or frequent urination) *BOWEL PROBLEMS (unusual diarrhea, constipation, pain near the anus) TENDERNESS IN MOUTH AND THROAT WITH OR WITHOUT PRESENCE OF ULCERS (sore throat, sores in mouth, or a toothache) UNUSUAL RASH, SWELLING OR PAIN  UNUSUAL VAGINAL DISCHARGE OR ITCHING   Items with * indicate a potential emergency and should be followed up as soon as possible or go to the Emergency Department if any problems should occur.  Please show the CHEMOTHERAPY ALERT CARD or IMMUNOTHERAPY ALERT CARD at check-in to the Emergency Department and triage nurse.  Should you have questions after your visit or need to cancel  or reschedule your appointment, please contact [facility] [phone]  and follow the prompts.  Office hours are 8:00 a.m. to 4:30 p.m. Monday - Friday. Please note that voicemails left after 4:00 p.m. may not be returned until the following business day.  We are closed weekends and major holidays. You have access to a nurse at all times for urgent questions. Please call the main number to the clinic [phone] and follow the prompts.  For any non-urgent questions, you may also contact your provider using MyChart. We now offer e-Visits for anyone 18 and older to request care online for non-urgent symptoms. For details visit [email or web address].   Also download the MyChart app! Go to the app store, search "MyChart", open the app, select , and log in with your MyChart username and password.  Due to Covid, a mask is required upon entering the hospital/clinic. If you do not have a mask, one will be given to you upon arrival. For doctor visits, patients may have 1 support person aged 18 or older with them. For treatment visits, patients cannot have anyone with them due to current Covid guidelines and our immunocompromised population.  

## 2021-05-31 NOTE — Progress Notes (Signed)
Patient presents today for iron infusion.  Patient is in satisfactory condition.  She complains of L flank pain d/t a kidney stone.  She has been prescribed Tramadol for this. Vital signs are stable.  We will proceed with treatment per MD orders.   Patient tolerated treatment well with no complaints voiced.  Patient left via wheelchair in stable condition.  Vital signs stable at discharge.  Follow up as scheduled.

## 2021-06-01 NOTE — Progress Notes (Signed)
Called pt no answer °

## 2021-06-07 ENCOUNTER — Emergency Department (HOSPITAL_COMMUNITY): Payer: Medicare Other

## 2021-06-07 ENCOUNTER — Emergency Department (HOSPITAL_COMMUNITY)
Admission: EM | Admit: 2021-06-07 | Discharge: 2021-06-07 | Disposition: A | Discharge status: Home / Self Care | Payer: Medicare Other | Attending: Emergency Medicine | Admitting: Emergency Medicine

## 2021-06-07 ENCOUNTER — Other Ambulatory Visit: Payer: Self-pay

## 2021-06-07 ENCOUNTER — Telehealth: Payer: Self-pay | Admitting: Family Medicine

## 2021-06-07 DIAGNOSIS — E876 Hypokalemia: Secondary | ICD-10-CM | POA: Diagnosis not present

## 2021-06-07 DIAGNOSIS — K219 Gastro-esophageal reflux disease without esophagitis: Secondary | ICD-10-CM | POA: Insufficient documentation

## 2021-06-07 DIAGNOSIS — Z79899 Other long term (current) drug therapy: Secondary | ICD-10-CM | POA: Insufficient documentation

## 2021-06-07 DIAGNOSIS — R079 Chest pain, unspecified: Secondary | ICD-10-CM | POA: Insufficient documentation

## 2021-06-07 DIAGNOSIS — I129 Hypertensive chronic kidney disease with stage 1 through stage 4 chronic kidney disease, or unspecified chronic kidney disease: Secondary | ICD-10-CM | POA: Insufficient documentation

## 2021-06-07 DIAGNOSIS — N183 Chronic kidney disease, stage 3 unspecified: Secondary | ICD-10-CM | POA: Insufficient documentation

## 2021-06-07 DIAGNOSIS — N200 Calculus of kidney: Secondary | ICD-10-CM | POA: Diagnosis not present

## 2021-06-07 DIAGNOSIS — G8929 Other chronic pain: Secondary | ICD-10-CM

## 2021-06-07 DIAGNOSIS — Z743 Need for continuous supervision: Secondary | ICD-10-CM | POA: Diagnosis not present

## 2021-06-07 DIAGNOSIS — Z87891 Personal history of nicotine dependence: Secondary | ICD-10-CM | POA: Insufficient documentation

## 2021-06-07 DIAGNOSIS — Z85828 Personal history of other malignant neoplasm of skin: Secondary | ICD-10-CM | POA: Diagnosis not present

## 2021-06-07 DIAGNOSIS — R0789 Other chest pain: Secondary | ICD-10-CM | POA: Diagnosis not present

## 2021-06-07 LAB — CBC WITH DIFFERENTIAL/PLATELET
Abs Immature Granulocytes: 0.03 10*3/uL (ref 0.00–0.07)
Basophils Absolute: 0 10*3/uL (ref 0.0–0.1)
Basophils Relative: 0 %
Eosinophils Absolute: 0.1 10*3/uL (ref 0.0–0.5)
Eosinophils Relative: 2 %
HCT: 34.1 % — ABNORMAL LOW (ref 36.0–46.0)
Hemoglobin: 11.4 g/dL — ABNORMAL LOW (ref 12.0–15.0)
Immature Granulocytes: 0 %
Lymphocytes Relative: 21 %
Lymphs Abs: 1.7 10*3/uL (ref 0.7–4.0)
MCH: 33.2 pg (ref 26.0–34.0)
MCHC: 33.4 g/dL (ref 30.0–36.0)
MCV: 99.4 fL (ref 80.0–100.0)
Monocytes Absolute: 0.9 10*3/uL (ref 0.1–1.0)
Monocytes Relative: 11 %
Neutro Abs: 5.4 10*3/uL (ref 1.7–7.7)
Neutrophils Relative %: 66 %
Platelets: 259 10*3/uL (ref 150–400)
RBC: 3.43 MIL/uL — ABNORMAL LOW (ref 3.87–5.11)
RDW: 15.9 % — ABNORMAL HIGH (ref 11.5–15.5)
WBC: 8.2 10*3/uL (ref 4.0–10.5)
nRBC: 0 % (ref 0.0–0.2)

## 2021-06-07 LAB — BASIC METABOLIC PANEL
Anion gap: 8 (ref 5–15)
BUN: 14 mg/dL (ref 8–23)
CO2: 23 mmol/L (ref 22–32)
Calcium: 10.1 mg/dL (ref 8.9–10.3)
Chloride: 105 mmol/L (ref 98–111)
Creatinine, Ser: 1.32 mg/dL — ABNORMAL HIGH (ref 0.44–1.00)
GFR, Estimated: 42 mL/min — ABNORMAL LOW (ref >60)
Glucose, Bld: 99 mg/dL (ref 70–99)
Potassium: 3.3 mmol/L — ABNORMAL LOW (ref 3.5–5.1)
Sodium: 136 mmol/L (ref 135–145)

## 2021-06-07 LAB — TROPONIN I (HIGH SENSITIVITY)
Troponin I (High Sensitivity): 3 ng/L (ref <18)
Troponin I (High Sensitivity): 3 ng/L (ref <18)

## 2021-06-07 MED ORDER — TRAMADOL HCL 50 MG PO TABS: 50.0000 mg | ORAL_TABLET | Freq: Four times a day (QID) | ORAL | 5 refills | Status: AC | PRN

## 2021-06-07 MED ORDER — KETOROLAC TROMETHAMINE 30 MG/ML IJ SOLN
30.0000 mg | Freq: Once | INTRAMUSCULAR | Status: AC
  Administered 2021-06-07: 17:00:00 30 mg via INTRAVENOUS
  Filled 2021-06-07: qty 1

## 2021-06-07 NOTE — ED Triage Notes (Signed)
Pt BIB [facility] c/o chest pain radiating to left arm, described as squeezing, started today. ASA 324mg , Nitro x 1 given, denies pain now. Pt reports chronic SOB and takes Symbicort BID, generalized weakness. H/O Anxiety.  135/88--> 106/77 HR 83 SPO2 96% RA NSR CBG 101

## 2021-06-07 NOTE — Telephone Encounter (Signed)
Please call the pt she is in pain from a kidney stone, she wants to talk to a nurse

## 2021-06-07 NOTE — ED Provider Notes (Signed)
[facility] EMERGENCY DEPARTMENT Provider Note   CSN: [id number] Arrival date & time: 06/07/21  1338     History Chief Complaint  Patient presents with   Chest Pain    Cheryl Abbott is a 75 y.o. female.   Chest Pain  This patient is a 75 year old female, she has multiple medical problems including a history of myeloma, history of cerebral microvascular disease, she has acid reflux, she has been known to have bilateral kidney stones and is currently under the care of urology.  She presents with a complaint of chest pain which seems to be middle of her chest and radiating to her left arm, this is been going on intermittently for some time but seem to be worse today, she had an echocardiogram in October 2019 which showed a normal echocardiogram.  She had a low risk Lexiscan Cardiolite test that was performed in 2015 and she tells me that she has had a heart catheterization within the last 5 years that was normal showing no obstructive coronary disease though I cannot find this result in the medical record.  She is able to tell me about the procedure and it does not fact sound like a heart catheterization.  She reports that she has been having bilateral flank pain with relating to her kidney stone the last CT scan she had was approximately 9 days ago and showed some left-sided hydroureteronephrosis with a 5 mm distal left ureteral stone.  No nothing obstructing on the right side.  The patient denies shortness of breath nausea vomiting or diaphoresis.  She does endorse having pain in her left chest which is nonexertional seems to come on after eating and for which her family doctor is currently treating her with acid reflux medication which seems to help intermittently.  The pain is currently present but my  Past Medical History:  Diagnosis Date   Abnormal brain scan 02/07/2012   Anemia    Anxiety    Anxiety disorder    Arthritis    Brain TIA 08/19/2015   Cancer ([place])    acute myeloma    Cerebral microvascular disease 08/19/2015   Chronic back pain    Depression    Fatty liver    GERD (gastroesophageal reflux disease)    Headache    HEMORRHOIDS 03/15/2009   OCT 2014 HB 12. 9     History of adenomatous polyp of colon    2008   History of kidney stones    Hypercalcemia    Hypercalcemia 11/30/2010   Hyperlipidemia    Hypertension    Lactose intolerance in adult 02/01/2016   Left ureteral stone    Lipoma of arm 01/19/2015   Lumbar disc disease with radiculopathy    Nephrolithiasis    Neuropathic pain of both feet 04/11/2016   Neuropathy, peripheral    Nocturnal hypoxia 02/04/2014   Study confirms this done 02/10/2014    OSA treated with BiPAP    per study 2007   RENAL CALCULUS 03/15/2009   Qualifier: Diagnosis of  By: Zeb Comfort     Sciatica    Shoulder pain, bilateral    Sigmoid diverticulosis    TIA (transient ischemic attack) 02/26/2016   Type 2 diabetes mellitus ([place])    Vaginal dryness, menopausal 10/14/2016    Patient Active Problem List   Diagnosis Date Noted   Calculus of ureter 05/30/2021   Hydronephrosis with urinary obstruction due to ureteral calculus 05/30/2021   Abdominal distension 05/03/2021   Nasal congestion 03/27/2021  Herniated nucleus pulposus, C5-6 12/26/2020   Herniated nucleus pulposus, C6-7 12/26/2020   Cervical radiculitis 11/14/2020   Numbness of hand 11/14/2020   Rash and nonspecific skin eruption 06/25/2020   Iron deficiency anemia 02/10/2020   Flank pain 01/11/2020   Pelvic pressure in female 11/17/2019   Adenomatous polyps    Cervical spondylosis 07/13/2019   Cervical spondylosis with radiculopathy 07/13/2019   Muscle cramps 07/01/2019   Depression, major, single episode, in partial remission ([place]) 03/13/2019   Eyelid cyst, right 01/15/2019   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   Shoulder pain, left 02/28/2018   Palpable mass of neck 02/28/2018   Insomnia 11/09/2017   Multiple myeloma ([place]) 03/15/2017   Urinary frequency  02/28/2017   Monoclonal paraproteinemia 02/15/2017   CKD (chronic kidney disease) stage 3, GFR 30-59 ml/min (HCC) 07/12/2015   Anemia 10/14/2014   Obesity (BMI 30.0-34.9) 01/02/2014   Generalized anxiety disorder 05/20/2013   Right shoulder pain 02/02/2013   Anxiety and depression 08/13/2012   Low back pain with left-sided sciatica 12/22/2010   Colon adenomas 05/11/2009   GERD 03/15/2009   FATTY LIVER DISEASE 03/15/2009   RENAL CALCULUS 03/15/2009   Sleep apnea 09/15/2008   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   Hyperlipidemia LDL goal <100 09/17/2007   Hypertension goal BP (blood pressure) < 130/80 09/17/2007    Past Surgical History:  Procedure Laterality Date   BIOPSY N/A 03/17/2013   Procedure: GASTRIC BIOPSIES;  Surgeon: Danie Binder, MD;  Location: [facility];  Service: Endoscopy;  Laterality: N/A;   BIOPSY  06/10/2015   Procedure: BIOPSY;  Surgeon: Danie Binder, MD;  [facility];  Service: Endoscopy;;  gastric biopsy   CARDIAC CATHETERIZATION  05-11-2003  dr Shelva Majestic ([facility])   Abnormal cardiolite/   Normal coronary arteries and normal LVF,  ef  63%   CARDIOVASCULAR STRESS TEST  01-01-2014   normal lexiscan cardiolite/  no ischemia/ infarct/  normal LV function and wall motion ,  ef 81%   COLONOSCOPY  last one 10/02/2011   MOD [place] TICS, IH, NEXT TCS APR 2018   COLONOSCOPY WITH PROPOFOL N/A 02/26/2017   Procedure: COLONOSCOPY WITH PROPOFOL;  Surgeon: Danie Binder, MD;  [facility];  Service: Endoscopy;  Laterality: N/A;  11:00am   COLONOSCOPY WITH PROPOFOL N/A 09/22/2019   Procedure: COLONOSCOPY WITH PROPOFOL;  Surgeon: Danie Binder, MD;  [facility];  Service: Endoscopy;  Laterality: N/A;  1:15   CYSTO/   RIGHT URETEROSOCOPY LASER LITHOTRIPSY STONE EXTRACTION  10-13-2004   CYSTO/  RIGHT RETROGRADE PYELOGRAM/  PLACMENT RIGHT URETERAL STENT  01-10-2010   CYSTOSCOPY W/ URETERAL STENT PLACEMENT Right  08/19/2015   Procedure: CYSTOSCOPY WITH RETROGRADE PYELOGRAM/URETERAL STENT PLACEMENT;  Surgeon: Franchot Gallo, MD;  Location: [facility];  Service: Urology;  Laterality: Right;   CYSTOSCOPY W/ URETERAL STENT PLACEMENT Left 02/23/2020   Procedure: CYSTOSCOPY LEFT  RETROGRADE PYELOGRAM LEFT URETEROSCOPY URETERAL STENT PLACEMENT;  Surgeon: Franchot Gallo, MD;  Location: [facility];  Service: Urology;  Laterality: Left;   CYSTOSCOPY WITH RETROGRADE PYELOGRAM, URETEROSCOPY AND STENT PLACEMENT Left 10/21/2014   Procedure: CYSTOSCOPY WITH RETROGRADE PYELOGRAM, URETEROSCOPY AND STENT PLACEMENT;  Surgeon: Franchot Gallo, MD;  Location: [facility];  Service: Urology;  Laterality: Left;   CYSTOSCOPY WITH RETROGRADE PYELOGRAM, URETEROSCOPY AND STENT PLACEMENT Right 11/22/2015   Procedure: CYSTOSCOPY, RIGHT URETERAL STENT REMOVAL; RIGHT RETROGRADE PYELOGRAM, RIGHT URETEROSCOPY WITH BALLOON DILATION; RIGHT URETERAL STENT PLACEMENT;  Surgeon: Annie Main  Dahlstedt, MD;  Location: [facility];  Service: Urology;  Laterality: Right;   CYSTOSCOPY WITH RETROGRADE PYELOGRAM, URETEROSCOPY AND STENT PLACEMENT Left 10/18/2020   Procedure: CYSTOSCOPY WITH LEFT RETROGRADE PYELOGRAM, LEFT URETEROSCOPY  WITH LASER AND LEFT URETERAL STENT PLACEMENT;  Surgeon: Franchot Gallo, MD;  Location: [facility];  Service: Urology;  Laterality: Left;   CYSTOSCOPY/RETROGRADE/URETEROSCOPY/STONE EXTRACTION WITH BASKET Bilateral 08/02/2015   Procedure: CYSTOSCOPY; BILATERAL RETROGRADE PYELOGRAMS; BILATERAL URETEROSCOPY, STONE EXTRACTION WITH BASKET;  Surgeon: Franchot Gallo, MD;  [facility]: [facility];  Service: Urology;  Laterality: Bilateral;   CYSTOSCOPY/RETROGRADE/URETEROSCOPY/STONE EXTRACTION WITH BASKET Right 06/28/2015   Procedure: CYSTOSCOPY/RETROGRADE/URETEROSCOPY/STONE EXTRACTION WITH BASKET, RIGHT URETERAL DOUBLE J STENT PLACEMENT;  Surgeon: Franchot Gallo, MD;  Location: [facility];  Service: Urology;  Laterality: Right;    ESOPHAGOGASTRODUODENOSCOPY (EGD) WITH PROPOFOL N/A 03/17/2013   Procedure: ESOPHAGOGASTRODUODENOSCOPY (EGD) WITH PROPOFOL;  Surgeon: Danie Binder, MD;  Location: [facility];  Service: Endoscopy;  Laterality: N/A;   ESOPHAGOGASTRODUODENOSCOPY (EGD) WITH PROPOFOL N/A 06/10/2015   Distal gastritis, distal esophageal stricture s/p dilation   ESOPHAGOGASTRODUODENOSCOPY (EGD) WITH PROPOFOL N/A 02/26/2017   Procedure: ESOPHAGOGASTRODUODENOSCOPY (EGD) WITH PROPOFOL;  Surgeon: Danie Binder, MD;  [facility];  Service: Endoscopy;  Laterality: N/A;   FLEXIBLE SIGMOIDOSCOPY  09/11/2011   UDJ:[id number] Internal hemorrhoids   HOLMIUM LASER APPLICATION Left 8/58/8502   Procedure: HOLMIUM LASER APPLICATION;  Surgeon: Franchot Gallo, MD;  Location[facility];  Service: Urology;  Laterality: Left;   HOLMIUM LASER APPLICATION Right 7/74/1287   Procedure: HOLMIUM LASER APPLICATION;  Surgeon: Franchot Gallo, MD;  [facility];  Service: Urology;  Laterality: Right;   HOLMIUM LASER APPLICATION  8/67/6720   Procedure: HOLMIUM LASER APPLICATION;  Surgeon: Franchot Gallo, MD;  [facility]: [facility];  Service: Urology;;   HOLMIUM LASER APPLICATION Left 9/47/0962   Procedure: HOLMIUM LASER APPLICATION;  Surgeon: Franchot Gallo, MD;  [facility];  Service: Urology;  Laterality: Left;   MASS EXCISION Left 03/04/2015   Procedure: EXCISION OF SOFT TISSUE NEOPLASM LEFT ARM;  Surgeon: Aviva Signs, MD;  Location: [facility];  Service: General;  Laterality: Left;   PERCUTANEOUS NEPHROSTOLITHOTOMY Bilateral 12/  2011     [facility]   POLYPECTOMY N/A 03/17/2013   Procedure: GASTRIC POLYPECTOMY;  Surgeon: Danie Binder, MD;  [facility];  Service: Endoscopy;  Laterality: N/A;   POLYPECTOMY  02/26/2017   Procedure: POLYPECTOMY;  Surgeon: Danie Binder, MD;  [facility];  Service: Endoscopy;;  polyp at cecum, ascending colon polyps x3, hepatic flexure polyps x8, transverse colon  polyps x8    POLYPECTOMY  09/22/2019   Procedure: POLYPECTOMY;  Surgeon: Danie Binder, MD;  [facility];  Service: Endoscopy;;   REMOVAL RIGHT THIGH CYST  2006   SAVORY DILATION N/A 03/17/2013   Procedure: SAVORY DILATION;  Surgeon: Danie Binder, MD;  Location[facility];  Service: Endoscopy;  Laterality: N/A;  #12.8, 14, 15, 16 dilators used   SAVORY DILATION N/A 06/10/2015   Procedure: SAVORY DILATION;  Surgeon: Danie Binder, MD;  [facility];  Service: Endoscopy;  Laterality: N/A;   TRANSTHORACIC ECHOCARDIOGRAM  01-01-2014   mild LVH/  ef 83-66%/  grade I diastolic dysfunction/  trivial MR, TR, and PR   VAGINAL HYSTERECTOMY  1970's     OB History     Gravida  2   Para  2   Term  2   Preterm      AB  Living  2      SAB      IAB      Ectopic      Multiple      Live Births              Family History  Problem Relation Age of Onset   Hypertension Mother    Diabetes Mother    Heart failure Mother    Dementia Mother    Emphysema Father    Hypertension Father    Diabetes Brother    GER disease Brother    Hypertension Sister    Hypertension Sister    Cancer Other        family history    Diabetes Other        family history    Heart defect Other        famiily history    Arthritis Other        family history    Anesthesia problems Neg Hx    Hypotension Neg Hx    Malignant hyperthermia Neg Hx    Pseudochol deficiency Neg Hx    Colon cancer Neg Hx     Social History   Tobacco Use   Smoking status: Former    Packs/day: 1.00    Years: 20.00    Pack years: 20.00    Types: Cigarettes    Start date: 09/21/1974    Quit date: 09/20/1988    Years since quitting: 32.7   Smokeless tobacco: Never   Tobacco comments:    Quit x 20 years  Vaping Use   Vaping Use: Never used  Substance Use Topics   Alcohol use: No    Alcohol/week: 0.0 standard drinks   Drug use: No    Home Medications Prior to Admission medications    Medication Sig Start Date End Date Taking? Authorizing Provider  acetaminophen (TYLENOL) 500 MG tablet Take 1,000 mg by mouth every 6 (six) hours as needed for mild pain.   Yes [provider]  amLODipine (NORVASC) 10 MG tablet TAKE 1 TABLET BY MOUTH EVERY DAY 11/09/20  Yes [name], MD  bisacodyl (DULCOLAX) 10 MG suppository Place 1 suppository (10 mg total) rectally as needed for moderate constipation. 05/18/21  Yes Pennington, Rebekah M, PA-C  budesonide-formoterol (SYMBICORT) 80-4.5 MCG/ACT inhaler INHALE 2 PUFFS INTO THE LUNGS TWICE A DAY Patient taking differently: Inhale 2 puffs into the lungs 2 (two) times daily. 06/16/20  Yes [name], MD  diclofenac Sodium (VOLTAREN) 1 % GEL Apply 4 g topically 4 (four) times daily as needed (pain). 12/05/20  Yes Carole Civil, MD  diphenhydramine-acetaminophen (TYLENOL PM) 25-500 MG TABS tablet Take 2 tablets by mouth at bedtime.   Yes [provider]  Dulaglutide (TRULICITY) 4.09 WJ/1.9JY SOPN Inject 0.75 mg into the skin once a week for 28 days. 05/17/21 06/14/21 Yes [name], MD  gabapentin (NEURONTIN) 100 MG capsule Take 100-200 mg by mouth at bedtime. 04/13/21  Yes [provider]  pantoprazole (PROTONIX) 40 MG tablet TAKE 1 TABLET BY MOUTH TWICE A DAY 02/07/21  Yes [name], MD  PARoxetine (PAXIL) 40 MG tablet TAKE 1 TABLET BY MOUTH EVERY DAY IN THE MORNING Patient taking differently: Take 40 mg by mouth in the morning. 05/29/21  Yes [name], MD  rosuvastatin (CRESTOR) 20 MG tablet TAKE 1 TABLET BY MOUTH EVERY DAY 11/09/20  Yes [name], MD  traMADol (ULTRAM) 50 MG tablet Take 1 tablet (50  mg total) by mouth every 6 (six) hours as needed. 03/06/21  Yes Carole Civil, MD  docusate sodium (COLACE) 100 MG capsule Take 1 capsule (100 mg total) by mouth 2 (two) times daily. Patient not taking: Reported on 06/07/2021 05/18/21   Harriett Rush, PA-C  glucose  blood [facility] ULTRA) test strip Use as instructed once daily dx e11.9 05/09/20   [name], MD  lactulose (CHRONULAC) 10 GM/15ML solution Take 30 mLs (20 g total) by mouth 3 (three) times daily. Until bowel movement Patient not taking: Reported on 06/07/2021 05/18/21   Harriett Rush, PA-C  OneTouch Delica Lancets 48J MISC Once daily testing dx e11.9 01/07/20   [name], MD  tamsulosin (FLOMAX) 0.4 MG CAPS capsule Take 1 capsule (0.4 mg total) by mouth daily. Patient not taking: Reported on 06/07/2021 05/30/21   Summerlin, Berneice Heinrich, PA-C  UNABLE TO FIND Diabetic shoes x 1 pair, inserts x 3 pair  DX E11.9 05/09/21   [name], MD    Allergies    Ace inhibitors, Keflex [cephalexin], Nitrofurantoin, and Penicillins  Review of Systems   Review of Systems  Cardiovascular:  Positive for chest pain.  All other systems reviewed and are negative.  Physical Exam Updated Vital Signs BP 113/64 (BP Location: Left Arm)    Pulse 83    Temp 98.4 F (36.9 C) (Oral)    Resp 20    Ht 1.676 m (5' 6" )    Wt 86.2 kg    SpO2 100%    BMI 30.67 kg/m   Physical Exam Vitals and nursing note reviewed.  Constitutional:      General: She is not in acute distress.    Appearance: She is well-developed.  HENT:     Head: Normocephalic and atraumatic.     Mouth/Throat:     Pharynx: No oropharyngeal exudate.  Eyes:     General: No scleral icterus.       Right eye: No discharge.        Left eye: No discharge.     Conjunctiva/sclera: Conjunctivae normal.     Pupils: Pupils are equal, round, and reactive to light.  Neck:     Thyroid: No thyromegaly.     Vascular: No JVD.  Cardiovascular:     Rate and Rhythm: Normal rate and regular rhythm.     Heart sounds: Normal heart sounds. No murmur heard.   No friction rub. No gallop.  Pulmonary:     Effort: Pulmonary effort is normal. No respiratory distress.     Breath sounds: Normal breath sounds. No wheezing or rales.   Chest:     Chest wall: No tenderness.  Abdominal:     General: Bowel sounds are normal. There is no distension.     Palpations: Abdomen is soft. There is no mass.     Tenderness: There is no abdominal tenderness.     Comments: No CVA tenderness  Musculoskeletal:        General: No tenderness. Normal range of motion.     Cervical back: Normal range of motion and neck supple.     Right lower leg: No tenderness. No edema.     Left lower leg: No tenderness. No edema.  Lymphadenopathy:     Cervical: No cervical adenopathy.  Skin:    General: Skin is warm and dry.     Findings: No erythema or rash.  Neurological:     Mental Status: She is alert.  Coordination: Coordination normal.  Psychiatric:        Behavior: Behavior normal.    ED Results / Procedures / Treatments   Labs (all labs ordered are listed, but only abnormal results are displayed) Labs Reviewed  BASIC METABOLIC PANEL - Abnormal; Notable for the following components:      Result Value   Potassium 3.3 (*)    Creatinine, Ser 1.32 (*)    GFR, Estimated 42 (*)    All other components within normal limits  CBC WITH DIFFERENTIAL/PLATELET - Abnormal; Notable for the following components:   RBC 3.43 (*)    Hemoglobin 11.4 (*)    HCT 34.1 (*)    RDW 15.9 (*)    All other components within normal limits  URINALYSIS, ROUTINE W REFLEX MICROSCOPIC  TROPONIN I (HIGH SENSITIVITY)  TROPONIN I (HIGH SENSITIVITY)    EKG EKG Interpretation  Date/Time:  Wednesday June 07 2021 13:50:19 EST Ventricular Rate:  82 PR Interval:  188 QRS Duration: 83 QT Interval:  384 QTC Calculation: 449 R Axis:   -4 Text Interpretation: Sinus rhythm Low voltage, precordial leads since last tracing no significant change Confirmed by Noemi Chapel [phone]) on 06/07/2021 4:11:16 PM  Radiology DG Chest Port 1 View  Result Date: 06/07/2021 CLINICAL DATA:  Chest pain radiating down left arm. EXAM: PORTABLE CHEST 1 VIEW COMPARISON:   07/16/2018 FINDINGS: Normal heart size. Chronic asymmetric elevation of right hemidiaphragm with overlying scar like opacity in the right lower lobe. No signs of pleural effusion or edema. No airspace densities identified. Spondylosis identified within the thoracic spine. IMPRESSION: 1. No acute cardiopulmonary abnormalities. 2. Chronic asymmetric elevation of right hemidiaphragm. Electronically Signed   By: Kerby Moors M.D.   On: 06/07/2021 14:23    Procedures Procedures   Medications Ordered in ED Medications  ketorolac (TORADOL) 30 MG/ML injection 30 mg (30 mg Intravenous Given 06/07/21 1708)    ED Course  I have reviewed the triage vital signs and the nursing notes.  Pertinent labs & imaging results that were available during my care of the patient were reviewed by me and considered in my medical decision making (see chart for details).    MDM Rules/Calculators/A&P                        This patient presents to the ED for concern of chest pain, this involves an extensive number of treatment options, and is a complaint that carries with it a high risk of complications and morbidity.  The differential diagnosis includes acute coronary syndrome, chest wall pain, less likely be pulmonary embolism given no tachycardia, her oxygenation is 95% on room air and she has clear lung sounds without coughing or hemoptysis.  There is no swelling of the leg   Additional history obtained:  Additional history obtained from electronic medical record looking at her prior echocardiograms and stress test External records from outside source obtained and reviewed including that noted in the history of present illness   Lab Tests:  I Ordered, reviewed, and interpreted labs.  The pertinent results include: Normal troponin, metabolic panel with minimal hypokalemia, creatinine 1.3, CBC.  The creatinine is better than it has been in the past, the potassium was 3.3, the troponin was 3 and repeated at 3 and  the patient was not able to make urine   Imaging Studies ordered:  I ordered imaging studies including portable chest x-ray I independently visualized and interpreted imaging which showed no acute  findings of infiltrate or pneumothorax, mediastinum unremarkable I agree with the radiologist interpretation   Cardiac Monitoring:  The patient was maintained on a cardiac monitor.  I personally viewed and interpreted the cardiac monitored which showed an underlying rhythm of: Normal sinus rhythm   Medicines ordered and prescription drug management:  I ordered medication including Toradol for chest pain Reevaluation of the patient after these medicines showed that the patient improved I have reviewed the patients home medicines and have made adjustments as needed   Critical Interventions:  Evaluation for cardiac disease Chest x-ray to rule out potential life-threatening causes such as pneumothorax abnormal mediastinum or abnormal lung field   Problem List / ED Course:   Chest pain: Patient evaluated with 2 troponins, there is no change in delta troponin, chest x-ray is unrermakable Hypokalemia: Minimal, can follow-up outpatient Kidney stone: Patient has known kidney stones, able to pass urine, not infected, vital signs unremarkable, no fever, stable for discharge.  She has no abdominal pain no back pain and no CVA tenderness.   Reevaluation:  After the interventions noted above, I reevaluated the patient and found that they have :improved   Dispostion:  After consideration of the diagnostic results and the patients response to treatment feel that the patent would benefit from discharge home to follow-up.        Final Clinical Impression(s) / ED Diagnoses Final diagnoses:  Nonspecific chest pain  Kidney stone  Hypokalemia    Rx / DC Orders ED Discharge Orders     None        Noemi Chapel, MD 06/07/21 [phone]

## 2021-06-07 NOTE — ED Notes (Signed)
Patient had gone to restroom before urine order was put in.  Advised patient that next time we needed urine specimen.  Patient stated unable to go to restroom at this time.

## 2021-06-07 NOTE — Discharge Instructions (Signed)
The testing on your heart shows that you are not having any signs of a heart attack.  The blood work shows that your potassium level was slightly low I would like for you to eat a potassium rich diet, follow-up with your family doctor for a recheck within 3 or 4 days Please see the heart doctors in the office this week I have given you their phone number, please call to arrange follow-up.  Return to the emergency department for worsening symptom

## 2021-06-08 NOTE — Telephone Encounter (Signed)
NA NVM attempted to call pt

## 2021-06-09 ENCOUNTER — Other Ambulatory Visit: Payer: Self-pay

## 2021-06-09 ENCOUNTER — Ambulatory Visit (HOSPITAL_COMMUNITY)
Admission: RE | Admit: 2021-06-09 | Discharge: 2021-06-09 | Disposition: A | Discharge status: Home / Self Care | Payer: Medicare Other | Source: Ambulatory Visit | Attending: Family Medicine | Admitting: Family Medicine

## 2021-06-09 DIAGNOSIS — Z1231 Encounter for screening mammogram for malignant neoplasm of breast: Secondary | ICD-10-CM | POA: Insufficient documentation

## 2021-06-11 ENCOUNTER — Other Ambulatory Visit: Payer: Self-pay | Admitting: Family Medicine

## 2021-06-12 ENCOUNTER — Other Ambulatory Visit: Payer: Self-pay

## 2021-06-12 ENCOUNTER — Ambulatory Visit (INDEPENDENT_AMBULATORY_CARE_PROVIDER_SITE_OTHER): Payer: Commercial Managed Care - HMO | Admitting: Nurse Practitioner

## 2021-06-12 ENCOUNTER — Encounter (HOSPITAL_COMMUNITY): Payer: Self-pay | Admitting: Hematology

## 2021-06-12 ENCOUNTER — Encounter: Payer: Self-pay | Admitting: Nurse Practitioner

## 2021-06-12 ENCOUNTER — Other Ambulatory Visit: Payer: Self-pay | Admitting: Orthopedic Surgery

## 2021-06-12 VITALS — BP 124/73 | HR 89 | Ht 66.5 in | Wt 156.0 lb

## 2021-06-12 DIAGNOSIS — E1121 Type 2 diabetes mellitus with diabetic nephropathy: Secondary | ICD-10-CM | POA: Diagnosis not present

## 2021-06-12 DIAGNOSIS — E876 Hypokalemia: Secondary | ICD-10-CM | POA: Insufficient documentation

## 2021-06-12 DIAGNOSIS — K219 Gastro-esophageal reflux disease without esophagitis: Secondary | ICD-10-CM

## 2021-06-12 DIAGNOSIS — I10 Essential (primary) hypertension: Secondary | ICD-10-CM

## 2021-06-12 MED ORDER — TRULICITY 1.5 MG/0.5ML ~~LOC~~ SOAJ
1.5000 mg | SUBCUTANEOUS | 0 refills | Status: DC
Start: 2021-06-12 — End: 2021-07-06

## 2021-06-12 NOTE — Patient Instructions (Signed)
Take trulicity 1.5mg  once weekly for your diabetes. Take protonix daily as ordered, avoid spicy foods, chocolates , tea, fatty fried foods.     It is important that you exercise regularly at least 30 minutes 5 times a week.  Think about what you will eat, plan ahead. Choose " clean, green, fresh or frozen" over canned, processed or packaged foods which are more sugary, salty and fatty. 70 to 75% of food eaten should be vegetables and fruit. Three meals at set times with snacks allowed between meals, but they must be fruit or vegetables. Aim to eat over a 12 hour period , example 7 am to 7 pm, and STOP after  your last meal of the day. Drink water,generally about 64 ounces per day, no other drink is as healthy. Fruit juice is best enjoyed in a healthy way, by EATING the fruit.  Thanks for choosing [facility], we consider it a privelige to serve you. Marland Kitchen

## 2021-06-12 NOTE — Assessment & Plan Note (Signed)
Take protonix 40 mg daily. Avoid spicy foods, fatty food, fred foods, chocolates, tea.

## 2021-06-12 NOTE — Assessment & Plan Note (Signed)
DASH diet and commitment to daily physical activity for a minimum of 30 minutes discussed and encouraged, as a part of hypertension management. The importance of attaining a healthy weight is also discussed.  BP/Weight 06/12/2021 06/07/2021 05/31/2021 05/30/2021 05/22/2021 05/18/2021 81/84/0375  Systolic BP 436 067 703 403 524 818 590  Diastolic BP 73 64 70 72 77 76 63  Wt. (Lbs) 156 190 - 190 - 190.92 196  BMI 24.8 30.67 - 30.21 - 30.35 30.7

## 2021-06-12 NOTE — Assessment & Plan Note (Signed)
CMP today

## 2021-06-12 NOTE — Progress Notes (Signed)
° °  Cheryl Abbott     MRN: [id number]      DOB: [birth year]   HPI Cheryl Abbott is here for follow up for hospital visit. She currently does not have not have chest pain. Troponin, chest xray and EKG was normal in the ED. She had potassium level of 3.3. she had a CT scan in the past which was normal. She had some epigastric pain after eating breakfast this morning, took protonix and it helped her symptoms.      ROS Denies recent fever or chills. Denies sinus pressure, nasal congestion, ear pain or sore throat. Denies chest congestion, productive cough or wheezing. Denies chest pains, palpitations and leg swelling Denies abdominal pain, nausea, vomiting,diarrhea or constipation.   Denies dysuria, frequency, hesitancy or incontinence. Denies joint pain, swelling and limitation in mobility. Denies headaches, seizures, numbness, or tingling. Denies depression, anxiety or insomnia. Denies skin break down or rash.   PE  BP 124/73 (BP Location: Right Arm, Patient Position: Sitting, Cuff Size: Normal)    Pulse 89    Ht 5' 6.5" (1.689 m)    Wt 156 lb (70.8 kg)    SpO2 92%    BMI 24.80 kg/m   Patient alert and oriented and in no cardiopulmonary distress.  HEENT: No facial asymmetry, EOMI,     Neck supple .  Chest: Clear to auscultation bilaterally.  CVS: S1, S2 no murmurs, no S3.Regular rate.  ABD: Soft non tender.   Ext: No edema  MS: Adequate ROM spine, shoulders, hips and knees.  Skin: Intact, no ulcerations or rash noted.  Psych: Good eye contact, normal affect. Memory intact not anxious or depressed appearing.  CNS: CN 2-12 intact, power,  normal throughout.no focal deficits noted.   Assessment & Plan

## 2021-06-12 NOTE — Assessment & Plan Note (Signed)
Lab Results  Component Value Date   HGBA1C 7.0 (H) 89/48/3475   Takes trulicity. She has completed trulicity 8.30 mg dose. Will order trulicity 1.5mg  one weekly injection.

## 2021-06-14 ENCOUNTER — Telehealth: Payer: Self-pay

## 2021-06-14 NOTE — Telephone Encounter (Signed)
Patient left a voice message:  She has not passed a kidney stone.  Wanting to know what she needs to do.  Please advise  Call back:  [phone] Jerilynn Mages)   Thanks, Helene Kelp

## 2021-06-14 NOTE — Telephone Encounter (Signed)
Pt was called and a vm could not be left, vm box is full.

## 2021-06-19 DIAGNOSIS — R0902 Hypoxemia: Secondary | ICD-10-CM | POA: Diagnosis not present

## 2021-06-21 ENCOUNTER — Other Ambulatory Visit: Payer: Self-pay | Admitting: Physician Assistant

## 2021-06-21 ENCOUNTER — Ambulatory Visit: Payer: Medicare Other | Admitting: Internal Medicine

## 2021-06-21 ENCOUNTER — Encounter: Payer: Self-pay | Admitting: Internal Medicine

## 2021-06-21 DIAGNOSIS — N201 Calculus of ureter: Secondary | ICD-10-CM

## 2021-06-23 LAB — BASIC METABOLIC PANEL
BUN/Creatinine Ratio: 8 — ABNORMAL LOW (ref 12–28)
BUN: 15 mg/dL (ref 8–27)
CO2: 26 mmol/L (ref 20–29)
Calcium: 10.5 mg/dL — ABNORMAL HIGH (ref 8.7–10.3)
Chloride: 100 mmol/L (ref 96–106)
Creatinine, Ser: 1.89 mg/dL — ABNORMAL HIGH (ref 0.57–1.00)
Glucose: 60 mg/dL — ABNORMAL LOW (ref 70–99)
Potassium: 4.3 mmol/L (ref 3.5–5.2)
Sodium: 140 mmol/L (ref 134–144)
eGFR: 27 mL/min/{1.73_m2} — ABNORMAL LOW (ref >59)

## 2021-06-25 IMAGING — MR MRI CERVICAL SPINE WITHOUT CONTRAST
4 of 5 series · 21 of 48 positions shown · non-contrast
Comparison: None.

CLINICAL DATA: Neck pain

EXAM:
MRI CERVICAL SPINE WITHOUT CONTRAST
TECHNIQUE: Multiplanar, multisequence MR imaging of the cervical spine was
performed. No intravenous contrast was administered.

[Series 4: T2 · sagittal · 3.0mm · 0.53mm/px · 6 of 15 slices shown (1 of 2)]
[im 1/15]
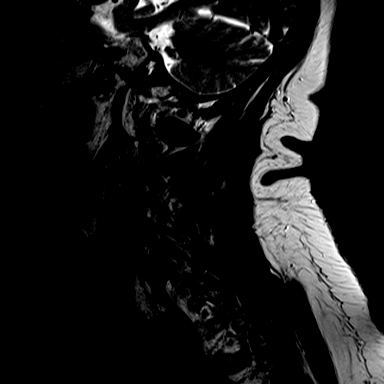
[im 3/15]
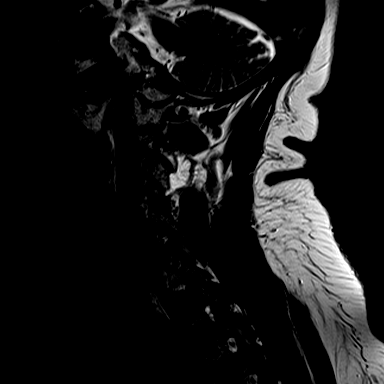
[im 6/15]
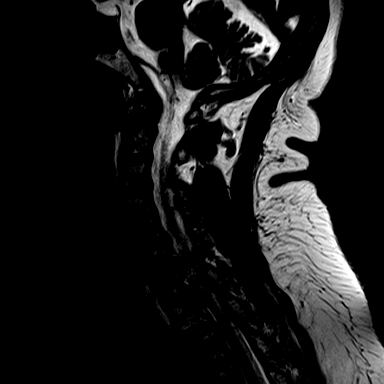
[im 9/15]
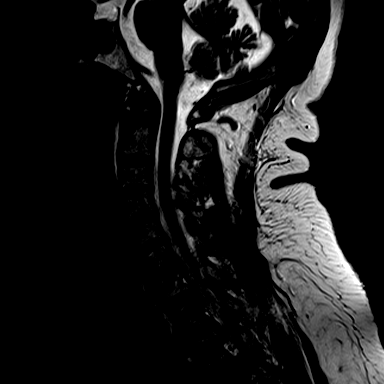
[im 12/15]
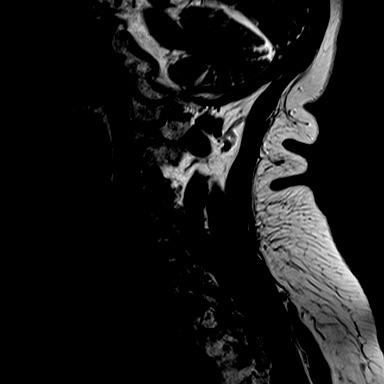
[im 15/15]
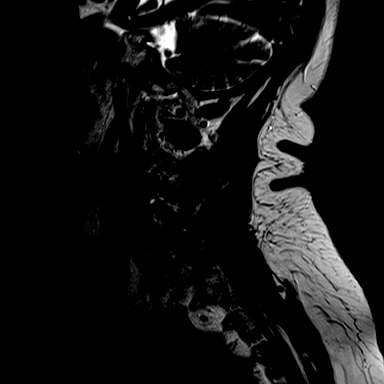

[Series 5: FLAIR · sagittal · 3.0mm · 0.63mm/px · 3 of 15 slices shown]
[im 3/15]
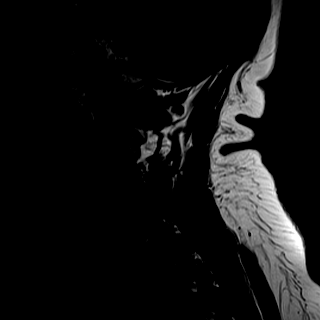
[im 8/15]
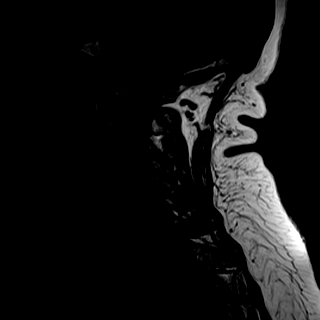
[im 12/15]
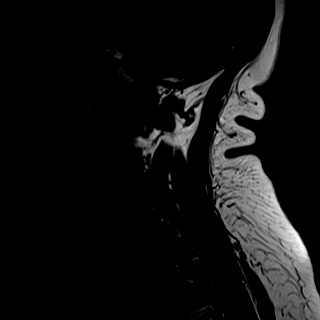

[Series 6: STIR · sagittal · 3.0mm · 0.32mm/px · 4 of 15 slices shown]
[im 1/15]
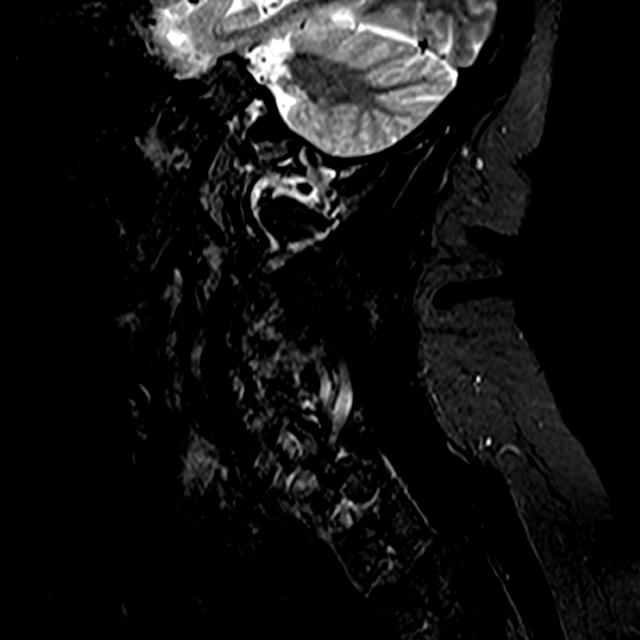
[im 3/15]
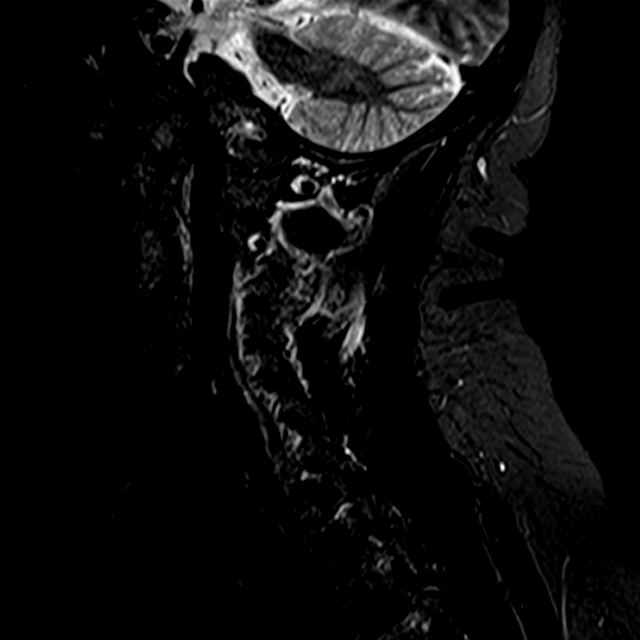
[im 8/15]
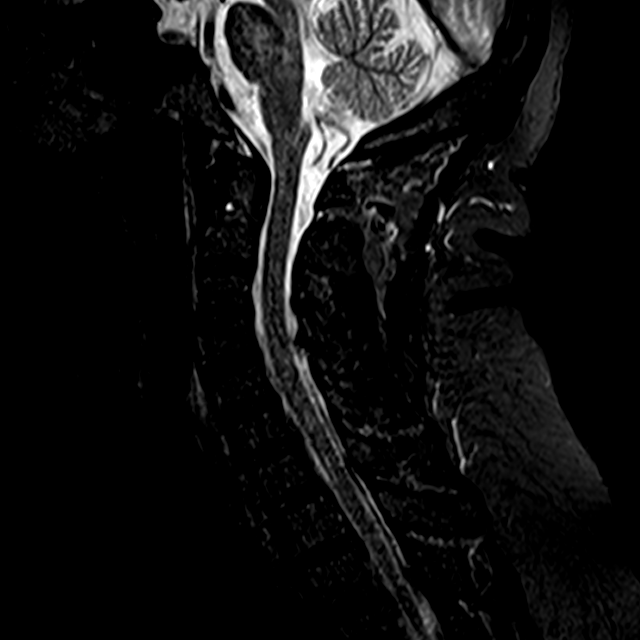
[im 12/15]
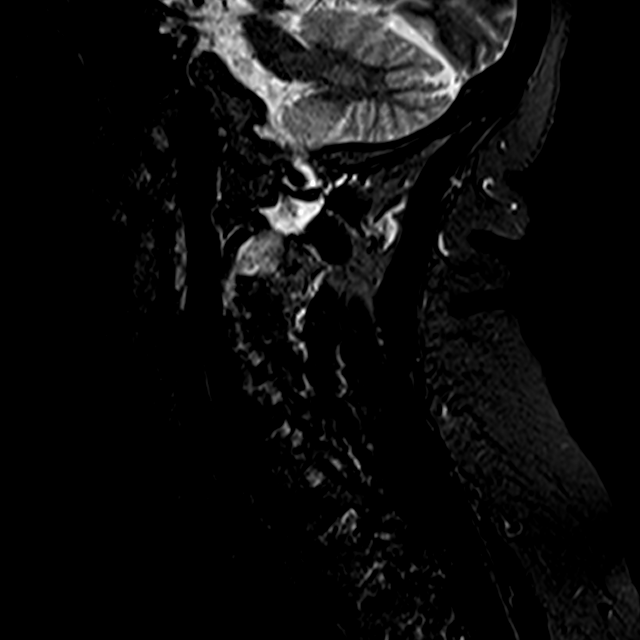

[Series 7: T2 · axial · 3.0mm · 0.24mm/px · z∈[-261,-168]mm · 8 of 30 slices shown (2 of 2)]
[im 1/30]
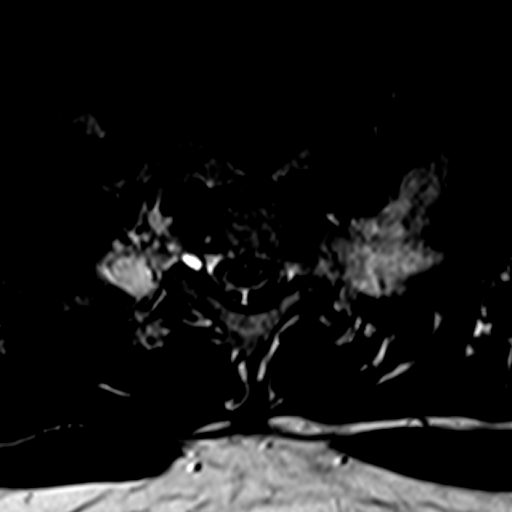
[im 5/30]
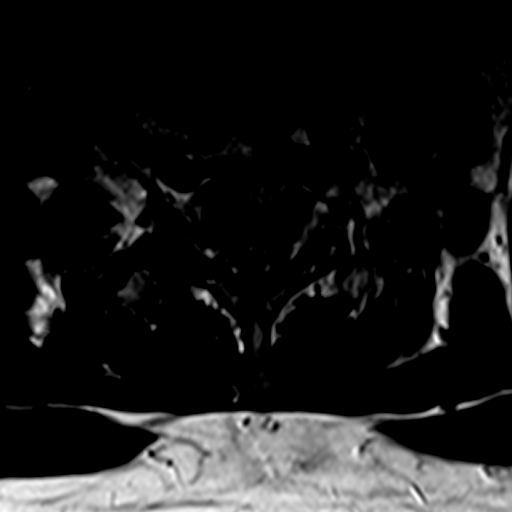
[im 9/30]
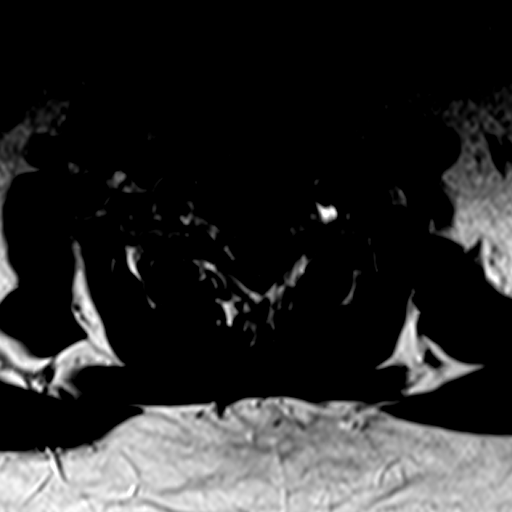
[im 14/30]
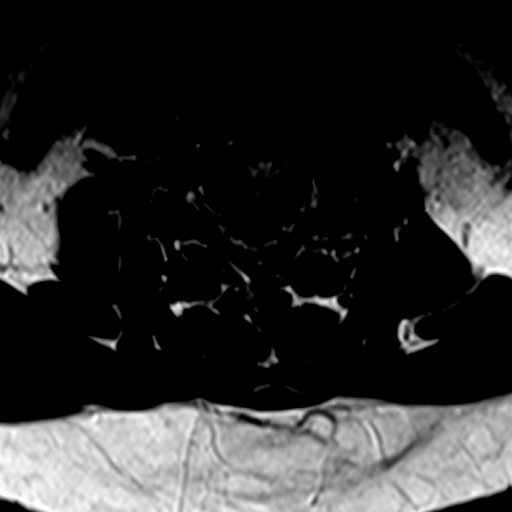
[im 16/30]
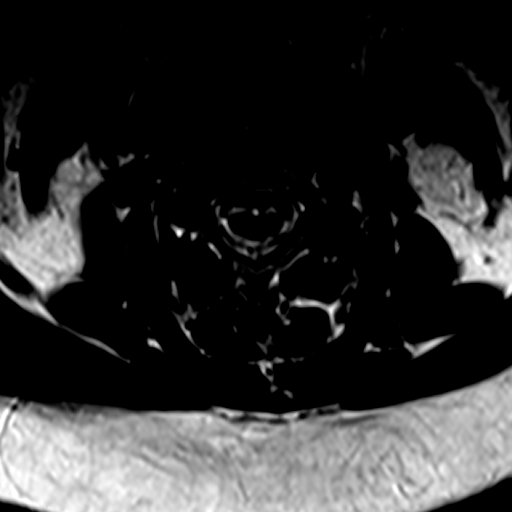
[im 21/30]
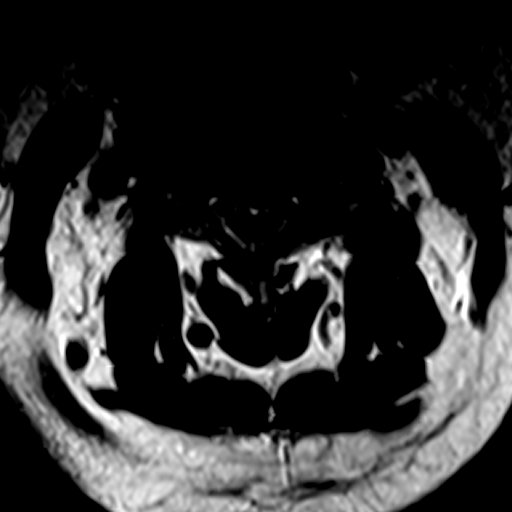
[im 25/30]
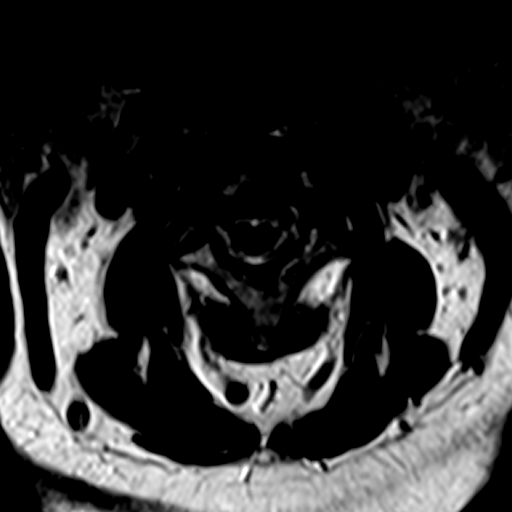
[im 30/30]
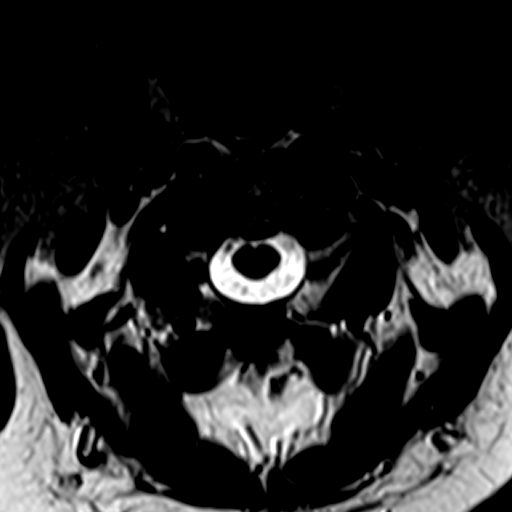

[21 of 48 positions shown; findings below may reference images not displayed]

FINDINGS: Alignment: Physiologic

Vertebrae: The vertebral body heights are well maintained. No
fracture, marrow edema,or pathologic marrow infiltration.

Cord: Normal signal and morphology.

Posterior Fossa, vertebral arteries, paraspinal tissues:

The visualized portion of the posterior fossa is unremarkable.
Normal flow voids seen within the vertebral arteries. The paraspinal
soft tissues are unremarkable.

Disc levels:

C1-C2: Atlanto-axial junction is normal, without canal narrowing

C2-C3: No significant spinal canal or neural foraminal narrowing

C3-C4: There is uncovertebral osteophytes which causes severe left
neural foraminal narrowing.

C4-C5: Uncovertebral osteophytes are present which causes mild right
neural foraminal narrowing.

C5-C6: There is a minimal disc osteophyte complex and uncovertebral
osteophytes which causes moderate right and mild left neural
foraminal narrowing.

C6-C7: There is a minimal disc osteophyte complex chest is mild
bilateral neural foraminal narrowing.

C7-T1: No significant spinal canal or neural foraminal narrowing
IMPRESSION: 1. Cervical spine spondylosis most notable C3-C4 with severe left
neural foraminal narrowing. No significant canal stenosis is seen.

## 2021-06-27 ENCOUNTER — Ambulatory Visit (INDEPENDENT_AMBULATORY_CARE_PROVIDER_SITE_OTHER): Payer: Medicare Other | Admitting: Urology

## 2021-06-27 ENCOUNTER — Encounter: Payer: Self-pay | Admitting: Urology

## 2021-06-27 ENCOUNTER — Other Ambulatory Visit: Payer: Self-pay

## 2021-06-27 VITALS — BP 119/70 | HR 112 | Wt 156.0 lb

## 2021-06-27 DIAGNOSIS — N1330 Unspecified hydronephrosis: Secondary | ICD-10-CM

## 2021-06-27 DIAGNOSIS — N201 Calculus of ureter: Secondary | ICD-10-CM | POA: Diagnosis not present

## 2021-06-27 DIAGNOSIS — N200 Calculus of kidney: Secondary | ICD-10-CM

## 2021-06-27 LAB — URINALYSIS, ROUTINE W REFLEX MICROSCOPIC
Bilirubin, UA: NEGATIVE
Glucose, UA: NEGATIVE
Ketones, UA: NEGATIVE
Nitrite, UA: NEGATIVE
Protein,UA: NEGATIVE
RBC, UA: NEGATIVE
Specific Gravity, UA: 1.015 (ref 1.005–1.030)
Urobilinogen, Ur: 0.2 mg/dL (ref 0.2–1.0)
pH, UA: 7 (ref 5.0–7.5)

## 2021-06-27 LAB — MICROSCOPIC EXAMINATION
Epithelial Cells (non renal): >10 /hpf — AB (ref 0–10)
RBC, Urine: NONE SEEN /hpf (ref 0–2)
Renal Epithel, UA: NONE SEEN /hpf

## 2021-06-27 NOTE — Progress Notes (Signed)
History of Present Illness: Cheryl Abbott is a 77 y.o. year old female here for follow-up of urolithiasis.  Longstanding history of recurrent urolithiasis.  Multiple interventions for stone disease, most recently May 2022 where she underwent left ureteroscopy and management of multiple calculi.  Stent was left in a significantly long time due to inability for the patient to tolerate in office cystoscopy as well as recurrent infections.  Recent presentation in December with left flank pain.  CT stone protocol on the 19th revealed a small left distal ureteral stone with marked left hydronephrosis.  Placed on medical expulsive therapy.  Presents today for follow-up.  Still complaining of twinges on the left side.  No gross hematuria, no dysuria, no fever, chills.  No nausea or vomiting.  She states that she is not passed a stone.  Most recent 24-hour urine collection that I can see was from 2018 revealing very low urine volume at 600 mL/day.  Hypocitraturia.  She had a normal parathyroid hormone and calcium level.  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 9     History of adenomatous polyp of colon    2008   History of kidney stones    Hypercalcemia    Hypercalcemia 11/30/2010   Hyperlipidemia    Hypertension    Lactose intolerance in adult 02/01/2016   Left ureteral stone    Lipoma of arm 01/19/2015   Lumbar disc disease with radiculopathy    Nephrolithiasis    Neuropathic pain of both feet 04/11/2016   Neuropathy, peripheral    Nocturnal hypoxia 02/04/2014   Study confirms this done 02/10/2014    OSA treated with BiPAP    per study 2007   RENAL CALCULUS  03/15/2009   Qualifier: Diagnosis of  By: Zeb Comfort     Sciatica    Shoulder pain, bilateral    Sigmoid diverticulosis    TIA (transient ischemic attack) 02/26/2016   Type 2 diabetes mellitus ([place])    Vaginal dryness, menopausal 10/14/2016    Past Surgical History:  Procedure Laterality Date   BIOPSY N/A 03/17/2013   Procedure: GASTRIC BIOPSIES;  Surgeon: Danie Binder, MD;  Location: [facility];  Service: Endoscopy;  Laterality: N/A;   BIOPSY  06/10/2015   Procedure: BIOPSY;  Surgeon: Danie Binder, MD;  [facility];  Service: Endoscopy;;  gastric biopsy   CARDIAC CATHETERIZATION  05-11-2003  dr Shelva Majestic ([facility])   Abnormal cardiolite/   Normal coronary arteries and normal LVF,  ef  63%   CARDIOVASCULAR STRESS TEST  01-01-2014   normal lexiscan cardiolite/  no ischemia/ infarct/  normal LV function and wall motion ,  ef 81%   COLONOSCOPY  last one 10/02/2011   MOD [facility] TICS, IH, NEXT TCS APR 2018   COLONOSCOPY WITH PROPOFOL N/A 02/26/2017   Procedure: COLONOSCOPY WITH PROPOFOL;  Surgeon: Danie Binder, MD;  [facility];  Service: Endoscopy;  Laterality: N/A;  11:00am   COLONOSCOPY WITH PROPOFOL N/A 09/22/2019   Procedure: COLONOSCOPY WITH PROPOFOL;  Surgeon: Danie Binder, MD;  [facility];  Service: Endoscopy;  Laterality: N/A;  1:15   CYSTO/   RIGHT URETEROSOCOPY LASER LITHOTRIPSY STONE EXTRACTION  10-13-2004   CYSTO/  RIGHT RETROGRADE PYELOGRAM/  PLACMENT RIGHT URETERAL STENT  01-10-2010   CYSTOSCOPY W/ URETERAL STENT PLACEMENT Right 08/19/2015   Procedure: CYSTOSCOPY WITH RETROGRADE PYELOGRAM/URETERAL STENT PLACEMENT;  Surgeon: Franchot Gallo, MD;  Location: [facility];  Service: Urology;  Laterality: Right;   CYSTOSCOPY W/ URETERAL STENT PLACEMENT Left 02/23/2020   Procedure: CYSTOSCOPY LEFT  RETROGRADE PYELOGRAM LEFT URETEROSCOPY URETERAL STENT PLACEMENT;  Surgeon: Franchot Gallo, MD;  Location: [facility];  Service: Urology;   Laterality: Left;   CYSTOSCOPY WITH RETROGRADE PYELOGRAM, URETEROSCOPY AND STENT PLACEMENT Left 10/21/2014   Procedure: CYSTOSCOPY WITH RETROGRADE PYELOGRAM, URETEROSCOPY AND STENT PLACEMENT;  Surgeon: Franchot Gallo, MD;  Location: [facility];  Service: Urology;  Laterality: Left;   CYSTOSCOPY WITH RETROGRADE PYELOGRAM, URETEROSCOPY AND STENT PLACEMENT Right 11/22/2015   Procedure: CYSTOSCOPY, RIGHT URETERAL STENT REMOVAL; RIGHT RETROGRADE PYELOGRAM, RIGHT URETEROSCOPY WITH BALLOON DILATION; RIGHT URETERAL STENT PLACEMENT;  Surgeon: Franchot Gallo, MD;  Location: [facility];  Service: Urology;  Laterality: Right;   CYSTOSCOPY WITH RETROGRADE PYELOGRAM, URETEROSCOPY AND STENT PLACEMENT Left 10/18/2020   Procedure: CYSTOSCOPY WITH LEFT RETROGRADE PYELOGRAM, LEFT URETEROSCOPY  WITH LASER AND LEFT URETERAL STENT PLACEMENT;  Surgeon: Franchot Gallo, MD;  [facility];  Service: Urology;  Laterality: Left;   CYSTOSCOPY/RETROGRADE/URETEROSCOPY/STONE EXTRACTION WITH BASKET Bilateral 08/02/2015   Procedure: CYSTOSCOPY; BILATERAL RETROGRADE PYELOGRAMS; BILATERAL URETEROSCOPY, STONE EXTRACTION WITH BASKET;  Surgeon: Franchot Gallo, MD;  [facility]: [facility];  Service: Urology;  Laterality: Bilateral;   CYSTOSCOPY/RETROGRADE/URETEROSCOPY/STONE EXTRACTION WITH BASKET Right 06/28/2015   Procedure: CYSTOSCOPY/RETROGRADE/URETEROSCOPY/STONE EXTRACTION WITH BASKET, RIGHT URETERAL DOUBLE J STENT PLACEMENT;  Surgeon: Franchot Gallo, MD;  [facility]: [facility];  Service: Urology;  Laterality: Right;   ESOPHAGOGASTRODUODENOSCOPY (EGD) WITH PROPOFOL N/A 03/17/2013   Procedure: ESOPHAGOGASTRODUODENOSCOPY (EGD) WITH PROPOFOL;  Surgeon: Danie Binder, MD;  Location: [facility];  Service: Endoscopy;  Laterality: N/A;   ESOPHAGOGASTRODUODENOSCOPY (EGD) WITH PROPOFOL N/A 06/10/2015   Distal gastritis, distal esophageal stricture s/p dilation   ESOPHAGOGASTRODUODENOSCOPY (EGD) WITH PROPOFOL N/A 02/26/2017    Procedure: ESOPHAGOGASTRODUODENOSCOPY (EGD) WITH PROPOFOL;  Surgeon: Danie Binder, MD;  [facility];  Service: Endoscopy;  Laterality: N/A;   FLEXIBLE SIGMOIDOSCOPY  09/11/2011   TZG:[id number] Internal hemorrhoids   HOLMIUM LASER APPLICATION Left 6/75/9163   Procedure: HOLMIUM LASER APPLICATION;  Surgeon: Franchot Gallo, MD;  [facility];  Service: Urology;  Laterality: Left;   HOLMIUM LASER APPLICATION Right 8/46/6599   Procedure: HOLMIUM LASER APPLICATION;  Surgeon: Franchot Gallo, MD;  [facility];  Service: Urology;  Laterality: Right;   HOLMIUM LASER APPLICATION  3/57/0177   Procedure: HOLMIUM LASER APPLICATION;  Surgeon: Franchot Gallo, MD;  [facility];  Service: Urology;;   HOLMIUM LASER APPLICATION Left 9/39/0300   Procedure: HOLMIUM LASER APPLICATION;  Surgeon: Franchot Gallo, MD;  [facility];  Service: Urology;  Laterality: Left;   MASS EXCISION Left 03/04/2015   Procedure: EXCISION OF SOFT TISSUE NEOPLASM LEFT ARM;  Surgeon: Aviva Signs, MD;  Location: [facility];  Service: General;  Laterality: Left;   PERCUTANEOUS NEPHROSTOLITHOTOMY Bilateral 12/  2011     [facility]   POLYPECTOMY N/A 03/17/2013   Procedure: GASTRIC POLYPECTOMY;  Surgeon: Danie Binder, MD;  [facility];  Service: Endoscopy;  Laterality: N/A;   POLYPECTOMY  02/26/2017  Procedure: POLYPECTOMY;  Surgeon: Danie Binder, MD;  [facility];  Service: Endoscopy;;  polyp at cecum, ascending colon polyps x3, hepatic flexure polyps x8, transverse colon polyps x8    POLYPECTOMY  09/22/2019   Procedure: POLYPECTOMY;  Surgeon: Danie Binder, MD;  [facility];  Service: Endoscopy;;   REMOVAL RIGHT THIGH CYST  2006   SAVORY DILATION N/A 03/17/2013   Procedure: SAVORY DILATION;  Surgeon: Danie Binder, MD;  [facility];  Service: Endoscopy;  Laterality: N/A;  #12.8, 14, 15, 16 dilators used   SAVORY DILATION N/A 06/10/2015    Procedure: SAVORY DILATION;  Surgeon: Danie Binder, MD;  [facility];  Service: Endoscopy;  Laterality: N/A;   TRANSTHORACIC ECHOCARDIOGRAM  01-01-2014   mild LVH/  ef 47-82%/  grade I diastolic dysfunction/  trivial MR, TR, and PR   VAGINAL HYSTERECTOMY  1970's    Home Medications:  (Not in a hospital admission)   Allergies:  Allergies  Allergen Reactions   Ace Inhibitors Cough    Patient doesn't recall   Keflex [Cephalexin] Diarrhea and Nausea And Vomiting   Nitrofurantoin Diarrhea and Nausea And Vomiting   Penicillins Hives and Other (See Comments)    Reaction:  Blisters on hands/feet  Has patient had a PCN reaction causing immediate rash, facial/tongue/throat swelling, SOB or lightheadedness with hypotension: No Has patient had a PCN reaction causing severe rash involving mucus membranes or skin necrosis: No Has patient had a PCN reaction that required hospitalization No Has patient had a PCN reaction occurring within the last 10 years: No If all of the above answers are "NO", then may proceed with Cephalosporin use.    Family History  Problem Relation Age of Onset   Hypertension Mother    Diabetes Mother    Heart failure Mother    Dementia Mother    Emphysema Father    Hypertension Father    Diabetes Brother    GER disease Brother    Hypertension Sister    Hypertension Sister    Cancer Other        family history    Diabetes Other        family history    Heart defect Other        famiily history    Arthritis Other        family history    Anesthesia problems Neg Hx    Hypotension Neg Hx    Malignant hyperthermia Neg Hx    Pseudochol deficiency Neg Hx    Colon cancer Neg Hx     Social History:  reports that she quit smoking about 32 years ago. Her smoking use included cigarettes. She started smoking about 46 years ago. She has a 20.00 pack-year smoking history. She has never used smokeless tobacco. She reports that she does not drink alcohol and  does not use drugs.  ROS: A complete review of systems was performed.  All systems are negative except for pertinent findings as noted.  Physical Exam:  Vital signs in last 24 hours: @VSRANGES @ General:  Alert and oriented, No acute distress HEENT: Normocephalic, atraumatic Neck: No JVD or lymphadenopathy Cardiovascular: Regular rate  Lungs: Normal inspiratory/expiratory excursion Abdomen: Soft, nontender, nondistended, no abdominal masses Back: No CVA tenderness Extremities: No edema Neurologic: Grossly intact  I have reviewed prior pt notes  I have reviewed notes from referring/previous physicians  I have reviewed urinalysis results-no evidence of infection today  I have independently  reviewed prior imaging--most recent CT revealed significant hydro  I have reviewed prior urine culture   Impression/Assessment:  1.  Recurrent urolithiasis with most recent distal ureteral stone seen on 19 December.  Still having some symptoms  2.  Marked hydronephrosis on that CT scan, needs follow-up  Plan:  1.  We will order renal ultrasound.  If significant hydro still present she will need stone protocol CT  2.  I did send her home with a 24-hour urine request  3.  Follow-up depending on ultrasound imaging  Lillette Boxer Thurl Boen 06/27/2021, 2:26 PM  Lillette Boxer. Cheryl Fortenberry MD

## 2021-06-27 NOTE — Progress Notes (Signed)
Urological Symptom Review  Patient is experiencing the following symptoms: Get up at night to urinate   Review of Systems  Gastrointestinal (upper)  : Nausea  Gastrointestinal (lower) : Constipation  Constitutional : Fatigue  Skin: Negative for skin symptoms  Eyes: Negative for eye symptoms  Ear/Nose/Throat : Negative for Ear/Nose/Throat symptoms  Hematologic/Lymphatic: Negative for Hematologic/Lymphatic symptoms  Cardiovascular : Leg swelling Chest pain  Respiratory : Negative for respiratory symptoms  Endocrine: Negative for endocrine symptoms  Musculoskeletal: Negative for musculoskeletal symptoms  Neurological: Negative for neurological symptoms  Psychologic: Negative for psychiatric symptoms

## 2021-06-28 ENCOUNTER — Ambulatory Visit: Payer: Medicare Other | Admitting: Family Medicine

## 2021-06-28 ENCOUNTER — Encounter (HOSPITAL_COMMUNITY): Payer: Self-pay | Admitting: Hematology

## 2021-06-30 IMAGING — DX LUMBAR SPINE - COMPLETE 4+ VIEW
5 series · 5 of 5 positions shown · non-contrast
Comparison: 03/31/2017

CLINICAL DATA: Pain status post fall

EXAM:
LUMBAR SPINE - COMPLETE 4+ VIEW

[l-spine ap]
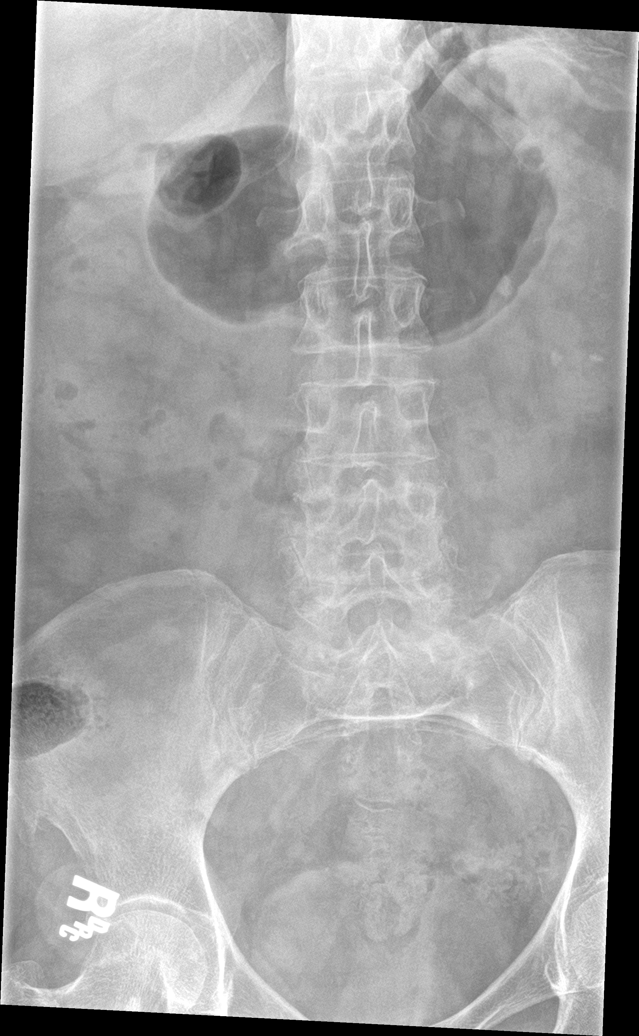

[l-spine obl (1 of 2)]
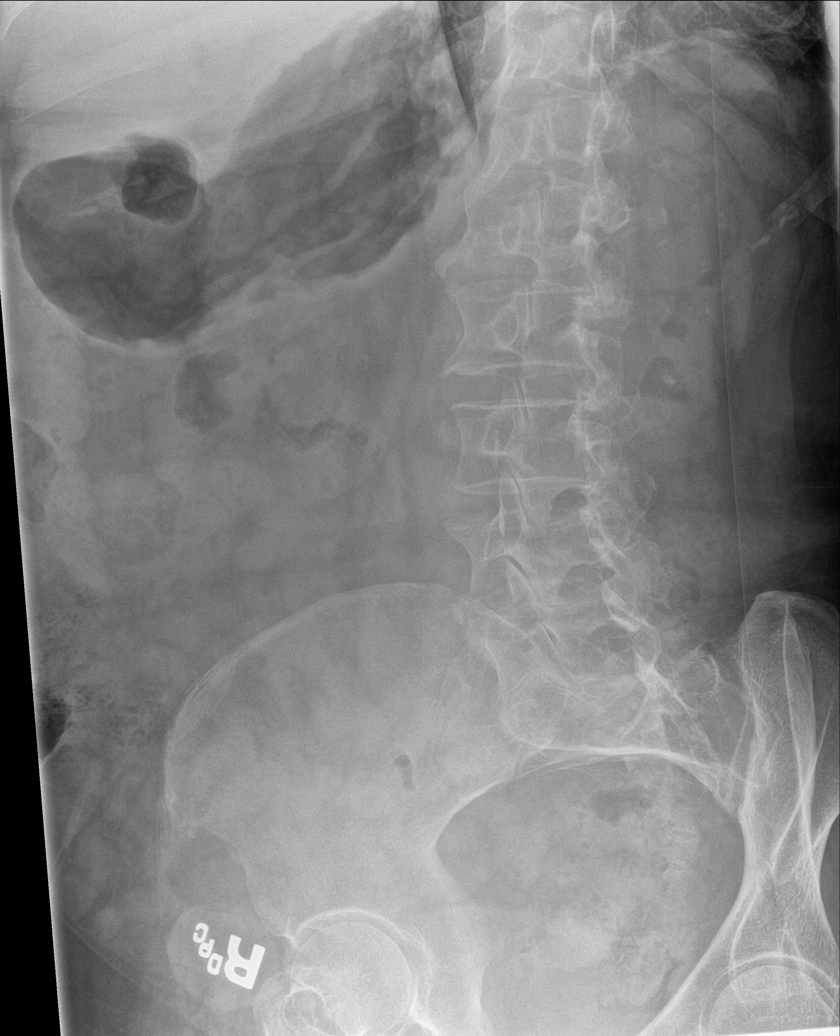

[l-spine obl (2 of 2)]
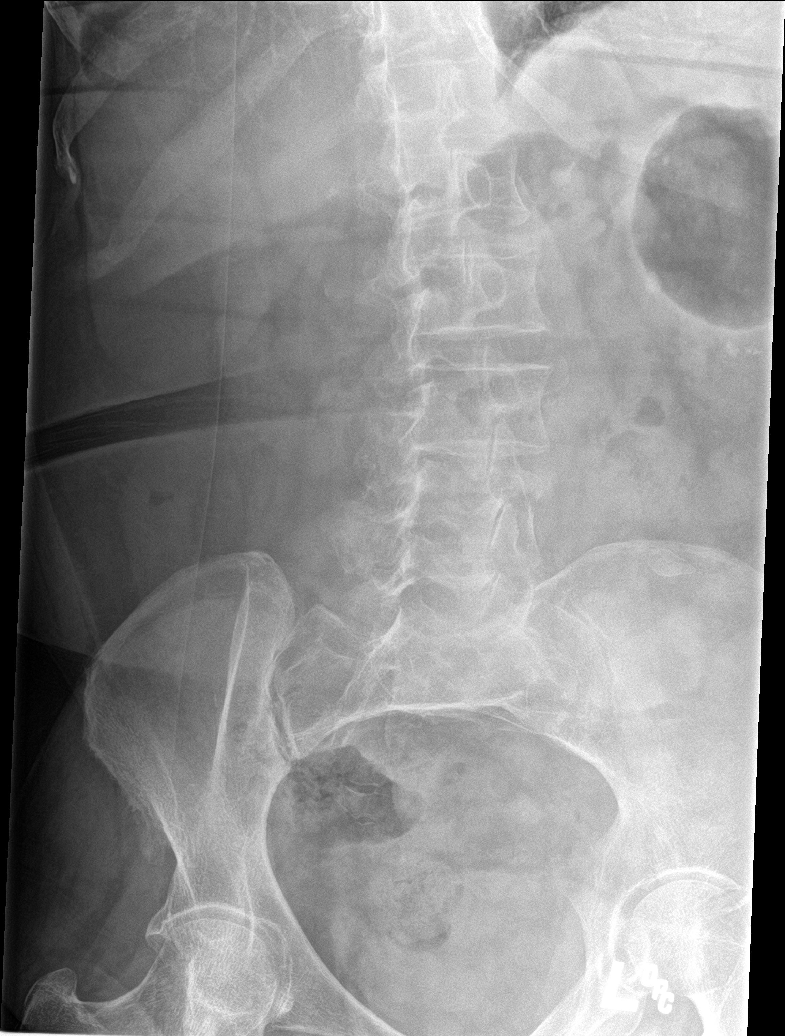

[l-spine lat]
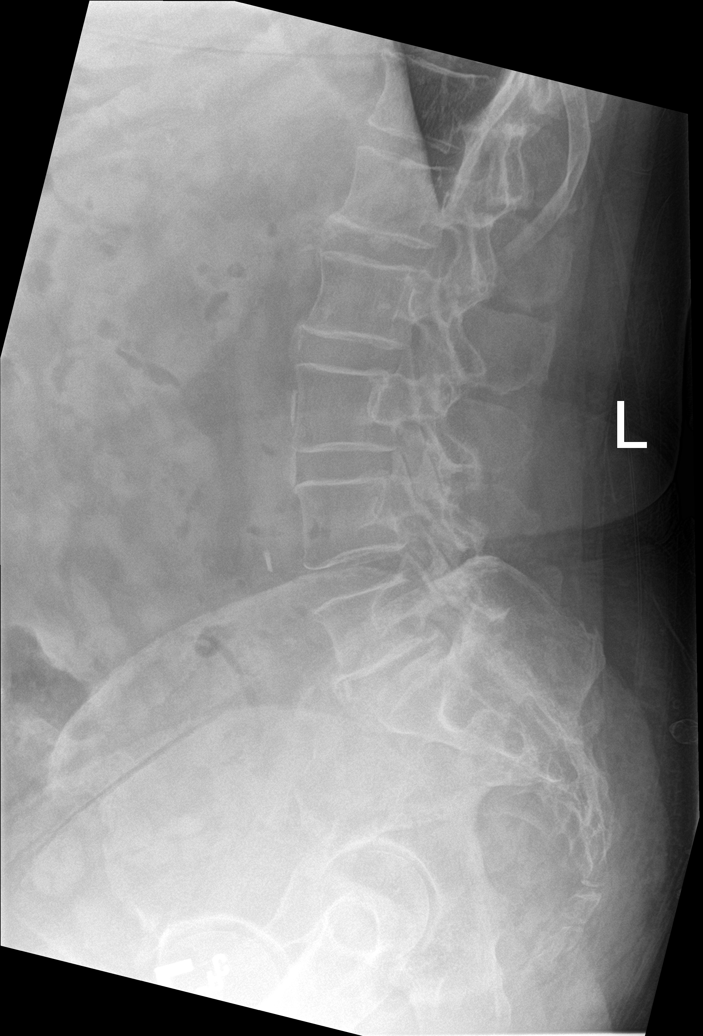

[l-spine spot]
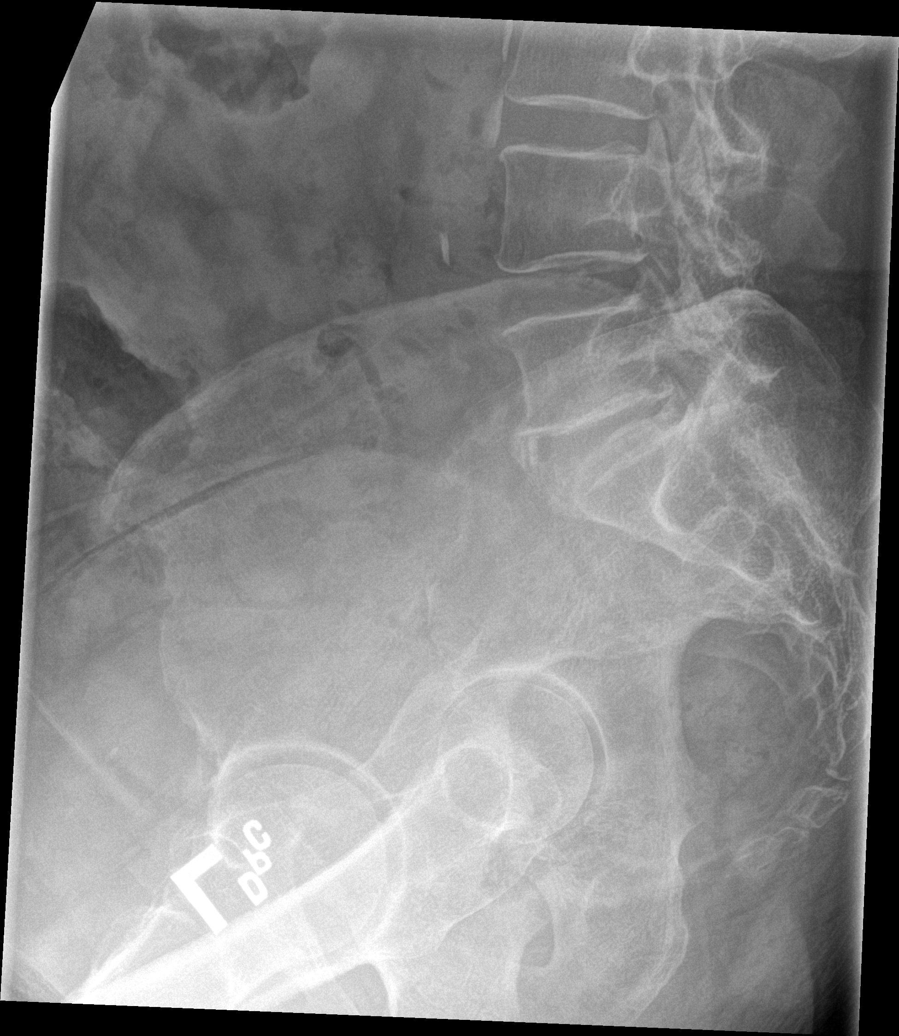

[5 of 5 positions shown; findings below may reference images not displayed]

FINDINGS: There are several densities projecting over the left kidney favored
to represent left-sided nephroliths. There is no displaced fracture.
Multilevel degenerative changes are noted throughout the lumbar
spine, greatest at the lower lumbar segments. There is multilevel
facet arthrosis, greatest at the lower lumbar segments.
IMPRESSION: 1. No acute osseous abnormality.
2. Multilevel degenerative changes throughout the lumbar spine.
3. Probable left-sided nephrolithiasis.

## 2021-07-05 ENCOUNTER — Ambulatory Visit (HOSPITAL_COMMUNITY)
Admission: RE | Admit: 2021-07-05 | Discharge: 2021-07-05 | Disposition: A | Discharge status: Home / Self Care | Payer: Medicare Other | Source: Ambulatory Visit | Attending: Urology | Admitting: Urology

## 2021-07-05 ENCOUNTER — Other Ambulatory Visit: Payer: Self-pay

## 2021-07-05 DIAGNOSIS — N201 Calculus of ureter: Secondary | ICD-10-CM | POA: Insufficient documentation

## 2021-07-05 DIAGNOSIS — N200 Calculus of kidney: Secondary | ICD-10-CM | POA: Diagnosis not present

## 2021-07-05 DIAGNOSIS — N281 Cyst of kidney, acquired: Secondary | ICD-10-CM | POA: Diagnosis not present

## 2021-07-05 DIAGNOSIS — N1330 Unspecified hydronephrosis: Secondary | ICD-10-CM | POA: Diagnosis not present

## 2021-07-06 ENCOUNTER — Encounter: Payer: Self-pay | Admitting: Family Medicine

## 2021-07-06 ENCOUNTER — Ambulatory Visit (INDEPENDENT_AMBULATORY_CARE_PROVIDER_SITE_OTHER): Payer: Medicare Other | Admitting: Family Medicine

## 2021-07-06 VITALS — BP 106/76 | HR 95 | Resp 16 | Ht 67.0 in | Wt 189.0 lb

## 2021-07-06 DIAGNOSIS — E1121 Type 2 diabetes mellitus with diabetic nephropathy: Secondary | ICD-10-CM | POA: Diagnosis not present

## 2021-07-06 DIAGNOSIS — E1169 Type 2 diabetes mellitus with other specified complication: Secondary | ICD-10-CM

## 2021-07-06 DIAGNOSIS — R5383 Other fatigue: Secondary | ICD-10-CM | POA: Insufficient documentation

## 2021-07-06 DIAGNOSIS — E785 Hyperlipidemia, unspecified: Secondary | ICD-10-CM

## 2021-07-06 DIAGNOSIS — R0789 Other chest pain: Secondary | ICD-10-CM

## 2021-07-06 DIAGNOSIS — T733XXD Exhaustion due to excessive exertion, subsequent encounter: Secondary | ICD-10-CM | POA: Diagnosis not present

## 2021-07-06 DIAGNOSIS — Z0001 Encounter for general adult medical examination with abnormal findings: Secondary | ICD-10-CM | POA: Diagnosis not present

## 2021-07-06 DIAGNOSIS — E663 Overweight: Secondary | ICD-10-CM

## 2021-07-06 DIAGNOSIS — I10 Essential (primary) hypertension: Secondary | ICD-10-CM

## 2021-07-06 DIAGNOSIS — Z0000 Encounter for general adult medical examination without abnormal findings: Secondary | ICD-10-CM

## 2021-07-06 LAB — POCT GLYCOSYLATED HEMOGLOBIN (HGB A1C): HbA1c, POC (prediabetic range): 5.9 % (ref 5.7–6.4)

## 2021-07-06 MED ORDER — LINAGLIPTIN 5 MG PO TABS: 5.0000 mg | ORAL_TABLET | Freq: Every day | ORAL | 5 refills | Status: DC

## 2021-07-06 NOTE — Assessment & Plan Note (Signed)

## 2021-07-06 NOTE — Assessment & Plan Note (Signed)
Increased fatigue and poor exercise tolerance over past 2 to 3 months, multiple risk factors for CAD, refer cardiology

## 2021-07-06 NOTE — Progress Notes (Signed)
Cheryl Abbott     MRN: [id number]      DOB: 1946-01-04  HPI: Patient is in for annual physical exam. C/o excess nausea,bloating and poor appetite in past 3 weeka, recently started on trulicity as she wants t lose weight but not tolerating well, tho Glyco HB in office marked improvement 2 month h/o intermittent chest pain and notes significant fatigue and poor exercise tolerance  Immunization is reviewed.   PE: BP 106/76    Pulse 95    Resp 16    Ht 5\' 7"  (1.702 m)    Wt 189 lb (85.7 kg)    SpO2 91%    BMI 29.60 kg/m   Pleasant  female, alert and oriented x 3, in no cardio-pulmonary distress. Afebrile. HEENT No facial trauma or asymetry. Sinuses non tender.  Extra occullar muscles intact.. External ears normal, . Neck: supple, no adenopathy,JVD or thyromegaly.No bruits.  Chest: Clear to ascultation bilaterally.No crackles or wheezes. Non tender to palpation    Cardiovascular system; Heart sounds normal,  S1 and  S2 ,no S3.  No murmur, or thrill. Apical beat not displaced Peripheral pulses normal.  Abdomen: Soft, mild epigastric tenderness, no guarding or rebound     Musculoskeletal exam: Decreased  ROM of spine, hips , shoulders and knees. No deformity ,swelling or crepitus noted. No muscle wasting or atrophy.   Neurologic: Cranial nerves 2 to 12 intact. Power, tone ,sensation  normal throughout. No disturbance in gait. No tremor.  Skin: Intact, no ulceration, erythema , scaling or rash noted. Pigmentation normal throughout  Psych; Normal mood and affect. Judgement and concentration normal   Assessment & Plan:  Annual physical exam Annual exam as documented. Counseling done  re healthy lifestyle involving commitment to 150 minutes exercise per week, heart healthy diet, and attaining healthy weight.The importance of adequate sleep also discussed. Regular seat belt use and home safety, is also discussed. Changes in health habits are decided on by the  patient with goals and time frames  set for achieving them. Immunization and cancer screening needs are specifically addressed at this visit.   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   The need to take medication as prescribed, test blood sugar as directed, and to call between visits if there is a concern that blood sugar is uncontrolled is also discussed.   Cheryl Abbott is reminded of the importance of daily foot exam, annual eye examination, and good blood sugar, blood pressure and cholesterol control.  Diabetic Labs Latest Ref Rng & Units 07/06/2021 06/12/2021 06/07/2021 05/11/2021 02/01/2021  HbA1c 5.7 - 6.4 % 5.9 - - - -  Microalbumin Not Estab. ug/mL - - - - 70.9(H)  Micro/Creat Ratio 0 - 29 mg/g creat - - - - 43(H)  Chol 0 - 200 mg/dL - - - - -  HDL >40 mg/dL - - - - -  Calc LDL 0 - 99 mg/dL - - - - -  Triglycerides <150 mg/dL - - - - -  Creatinine 0.57 - 1.00 mg/dL - 1.89(H) 1.32(H) 1.83(H) -   BP/Weight 07/06/2021 06/27/2021 06/12/2021 06/07/2021 05/31/2021 05/30/2021 93/71/6967  Systolic BP 893 810 175 102 585 277 824  Diastolic BP 76 70 73 64 70 72 77  Wt. (Lbs) 189  156 156 190 - 190 -  BMI 29.6 24.8 24.8 30.67 - 30.21 -   Foot/eye exam completion dates Latest Ref Rng & Units 03/08/2021 02/17/2021  Eye Exam No Retinopathy No Retinopathy -  Foot exam Order - - -  Foot Form Completion - - Done      Excellent control bu intolerant of trulicity, resume tradjenta  Fatigue Increased fatigue and poor exercise tolerance over past 2 to 3 months, multiple risk factors for CAD, refer cardiology

## 2021-07-06 NOTE — Assessment & Plan Note (Signed)
°  Patient re-educated about  the importance of commitment to a  minimum of 150 minutes of exercise per week as able.  The importance of healthy food choices with portion control discussed, as well as eating regularly and within a 12 hour window most days. The need to choose "clean , green" food 50 to 75% of the time is discussed, as well as to make water the primary drink and set a goal of 64 ounces water daily.    Weight /BMI 07/06/2021 06/27/2021 06/12/2021  WEIGHT 189 lb 156 lb 156 lb  HEIGHT 5\' 7"  - 5' 6.5"  BMI 29.6 kg/m2 24.8 kg/m2 24.8 kg/m2

## 2021-07-06 NOTE — Patient Instructions (Addendum)
F/U in 3.5 months, call if you need me before  Stop trulicity this is contributing to your nausea and abdominal bloating  REsume tradjenta one daily for diabetes  Blood sugar checked today in office, excellent!  Please get fasting lipid and hepatic at hospital in next 1 to 3 weeks  You are referred to Dr Harl Bowie re fatigue and chest pain  Thanks for choosing [facility], we consider it a privelige to serve you.

## 2021-07-06 NOTE — Assessment & Plan Note (Signed)
Cheryl Abbott is reminded of the importance of commitment to daily physical activity for 30 minutes or more, as able and the need to limit carbohydrate intake to 30 to 60 grams per meal to help with blood sugar control.   The need to take medication as prescribed, test blood sugar as directed, and to call between visits if there is a concern that blood sugar is uncontrolled is also discussed.   Cheryl Abbott is reminded of the importance of daily foot exam, annual eye examination, and good blood sugar, blood pressure and cholesterol control.  Diabetic Labs Latest Ref Rng & Units 07/06/2021 06/12/2021 06/07/2021 05/11/2021 02/01/2021  HbA1c 5.7 - 6.4 % 5.9 - - - -  Microalbumin Not Estab. ug/mL - - - - 70.9(H)  Micro/Creat Ratio 0 - 29 mg/g creat - - - - 43(H)  Chol 0 - 200 mg/dL - - - - -  HDL >40 mg/dL - - - - -  Calc LDL 0 - 99 mg/dL - - - - -  Triglycerides <150 mg/dL - - - - -  Creatinine 0.57 - 1.00 mg/dL - 1.89(H) 1.32(H) 1.83(H) -   BP/Weight 07/06/2021 06/27/2021 06/12/2021 06/07/2021 05/31/2021 05/30/2021 32/35/5732  Systolic BP 202 542 706 237 628 315 176  Diastolic BP 76 70 73 64 70 72 77  Wt. (Lbs) 189 156 156 190 - 190 -  BMI 29.6 24.8 24.8 30.67 - 30.21 -   Foot/eye exam completion dates Latest Ref Rng & Units 03/08/2021 02/17/2021  Eye Exam No Retinopathy No Retinopathy -  Foot exam Order - - -  Foot Form Completion - - Done      Excellent control bu intolerant of trulicity, resume tradjenta

## 2021-07-07 ENCOUNTER — Encounter (HOSPITAL_COMMUNITY): Payer: Self-pay | Admitting: Hematology

## 2021-07-10 ENCOUNTER — Other Ambulatory Visit: Payer: Self-pay | Admitting: Nurse Practitioner

## 2021-07-10 DIAGNOSIS — E1121 Type 2 diabetes mellitus with diabetic nephropathy: Secondary | ICD-10-CM

## 2021-07-11 ENCOUNTER — Other Ambulatory Visit (HOSPITAL_COMMUNITY)
Admission: RE | Admit: 2021-07-11 | Discharge: 2021-07-11 | Disposition: A | Discharge status: Home / Self Care | Payer: Medicare Other | Source: Ambulatory Visit | Attending: Family Medicine | Admitting: Family Medicine

## 2021-07-11 ENCOUNTER — Other Ambulatory Visit: Payer: Self-pay

## 2021-07-11 ENCOUNTER — Other Ambulatory Visit: Payer: Self-pay | Admitting: Family Medicine

## 2021-07-11 DIAGNOSIS — E1169 Type 2 diabetes mellitus with other specified complication: Secondary | ICD-10-CM | POA: Insufficient documentation

## 2021-07-11 DIAGNOSIS — E785 Hyperlipidemia, unspecified: Secondary | ICD-10-CM | POA: Insufficient documentation

## 2021-07-11 LAB — LIPID PANEL
Cholesterol: 129 mg/dL (ref 0–200)
HDL: 41 mg/dL (ref >40)
LDL Cholesterol: 58 mg/dL (ref 0–99)
Total CHOL/HDL Ratio: 3.1 RATIO
Triglycerides: 152 mg/dL — ABNORMAL HIGH (ref <150)
VLDL: 30 mg/dL (ref 0–40)

## 2021-07-14 ENCOUNTER — Other Ambulatory Visit: Payer: Self-pay | Admitting: Urology

## 2021-07-14 DIAGNOSIS — N132 Hydronephrosis with renal and ureteral calculous obstruction: Secondary | ICD-10-CM

## 2021-07-16 NOTE — Progress Notes (Signed)
Cardiology Office Note    Date:  07/19/2021   ID:  Cheryl Abbott, DOB [birth year], MRN [id number]  PCP:  [name], MD  Cardiologist: Carlyle Dolly, MD    Chief Complaint  Patient presents with   Follow-up    Overdue Visit    History of Present Illness:    Cheryl Abbott is a 76 y.o. female with past medical history of HTN, HLD, Type 2 DM, lower extremity edema (felt to be secondary to venous insufficiency), Stage 3 CKD, history of TIA and OSA (on CPAP) who presents to the office today for overdue follow-up.  She most recently had a telehealth visit with myself in 08/2019 and reported that her breathing had been stable and she still had chronic lower extremity edema but had not noticed any recent change in this. Treatment options were limited as she was intolerant to diuretic therapy in the setting of cramping with this and she was encouraged to continue with conservative measures including limiting her sodium and fluid intake.  In the interim, she did present to [facility] ED in 05/2021 for evaluation of chest pain. Symptoms at that time were felt to be atypical for a cardiac etiology and Hs Troponin values were negative. She was evaluated by her PCP in 06/2021 and reported worsening fatigue and poor exercise tolerance over the past 2 to 3 months, therefore she was referred back to Cardiology for consideration of further work-up.  In talking with the patient today, she reports having occasional episodes of a shooting pain along her sternal region which last for a few seconds at a time and spontaneously resolve. Not associated with exertion, positional changes or food consumption. Says that she is not very active at baseline and the most active thing she does routinely is walk around her home during the day. Uses an electric wheelchair when going to the grocery store or [place]. She continues to have some intermittent lower extremity edema but monitors her sodium intake.   Denies any specific orthopnea or PND. Reports having dyspnea on exertion and fatigue for several years which has been unchanged.   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 9     History of adenomatous polyp of colon    2008   History of kidney stones    Hypercalcemia    Hypercalcemia 11/30/2010   Hyperlipidemia    Hypertension    Lactose intolerance in adult 02/01/2016   Left ureteral stone    Lipoma of arm 01/19/2015   Lumbar disc disease with radiculopathy    Nephrolithiasis    Neuropathic pain of both feet 04/11/2016   Neuropathy, peripheral    Nocturnal hypoxia 02/04/2014   Study confirms this done 02/10/2014    OSA treated with BiPAP    per study 2007   RENAL CALCULUS 03/15/2009   Qualifier: Diagnosis of  By: Zeb Comfort     Sciatica    Shoulder pain, bilateral    Sigmoid diverticulosis    TIA (transient ischemic attack) 02/26/2016   Type 2  diabetes mellitus ([place])    Vaginal dryness, menopausal 10/14/2016    Past Surgical History:  Procedure Laterality Date   BIOPSY N/A 03/17/2013   Procedure: GASTRIC BIOPSIES;  Surgeon: Danie Binder, MD;  Location: [facility];  Service: Endoscopy;  Laterality: N/A;   BIOPSY  06/10/2015   Procedure: BIOPSY;  Surgeon: Danie Binder, MD;  [facility];  Service: Endoscopy;;  gastric biopsy   CARDIAC CATHETERIZATION  05-11-2003  dr Shelva Majestic ([facility])   Abnormal cardiolite/   Normal coronary arteries and normal LVF,  ef  63%   CARDIOVASCULAR STRESS TEST  01-01-2014   normal lexiscan cardiolite/  no ischemia/ infarct/  normal LV function and wall motion ,  ef 81%   COLONOSCOPY  last  one 10/02/2011   MOD [place] TICS, IH, NEXT TCS APR 2018   COLONOSCOPY WITH PROPOFOL N/A 02/26/2017   Procedure: COLONOSCOPY WITH PROPOFOL;  Surgeon: Danie Binder, MD;  [facility];  Service: Endoscopy;  Laterality: N/A;  11:00am   COLONOSCOPY WITH PROPOFOL N/A 09/22/2019   Procedure: COLONOSCOPY WITH PROPOFOL;  Surgeon: Danie Binder, MD;  [facility];  Service: Endoscopy;  Laterality: N/A;  1:15   CYSTO/   RIGHT URETEROSOCOPY LASER LITHOTRIPSY STONE EXTRACTION  10-13-2004   CYSTO/  RIGHT RETROGRADE PYELOGRAM/  PLACMENT RIGHT URETERAL STENT  01-10-2010   CYSTOSCOPY W/ URETERAL STENT PLACEMENT Right 08/19/2015   Procedure: CYSTOSCOPY WITH RETROGRADE PYELOGRAM/URETERAL STENT PLACEMENT;  Surgeon: Franchot Gallo, MD;  [facility];  Service: Urology;  Laterality: Right;   CYSTOSCOPY W/ URETERAL STENT PLACEMENT Left 02/23/2020   Procedure: CYSTOSCOPY LEFT  RETROGRADE PYELOGRAM LEFT URETEROSCOPY URETERAL STENT PLACEMENT;  Surgeon: Franchot Gallo, MD;  [facility]: [facility];  Service: Urology;  Laterality: Left;   CYSTOSCOPY WITH RETROGRADE PYELOGRAM, URETEROSCOPY AND STENT PLACEMENT Left 10/21/2014   Procedure: CYSTOSCOPY WITH RETROGRADE PYELOGRAM, URETEROSCOPY AND STENT PLACEMENT;  Surgeon: Franchot Gallo, MD;  Location: [facility];  Service: Urology;  Laterality: Left;   CYSTOSCOPY WITH RETROGRADE PYELOGRAM, URETEROSCOPY AND STENT PLACEMENT Right 11/22/2015   Procedure: CYSTOSCOPY, RIGHT URETERAL STENT REMOVAL; RIGHT RETROGRADE PYELOGRAM, RIGHT URETEROSCOPY WITH BALLOON DILATION; RIGHT URETERAL STENT PLACEMENT;  Surgeon: Franchot Gallo, MD;  [facility];  Service: Urology;  Laterality: Right;   CYSTOSCOPY WITH RETROGRADE PYELOGRAM, URETEROSCOPY AND STENT PLACEMENT Left 10/18/2020   Procedure: CYSTOSCOPY WITH LEFT RETROGRADE PYELOGRAM, LEFT URETEROSCOPY  WITH LASER AND LEFT URETERAL STENT PLACEMENT;  Surgeon: Franchot Gallo, MD;  [facility];   Service: Urology;  Laterality: Left;   CYSTOSCOPY/RETROGRADE/URETEROSCOPY/STONE EXTRACTION WITH BASKET Bilateral 08/02/2015   Procedure: CYSTOSCOPY; BILATERAL RETROGRADE PYELOGRAMS; BILATERAL URETEROSCOPY, STONE EXTRACTION WITH BASKET;  Surgeon: Franchot Gallo, MD;  [facility];  Service: Urology;  Laterality: Bilateral;   CYSTOSCOPY/RETROGRADE/URETEROSCOPY/STONE EXTRACTION WITH BASKET Right 06/28/2015   Procedure: CYSTOSCOPY/RETROGRADE/URETEROSCOPY/STONE EXTRACTION WITH BASKET, RIGHT URETERAL DOUBLE J STENT PLACEMENT;  Surgeon: Franchot Gallo, MD;  [facility];  Service: Urology;  Laterality: Right;   ESOPHAGOGASTRODUODENOSCOPY (EGD) WITH PROPOFOL N/A 03/17/2013   Procedure: ESOPHAGOGASTRODUODENOSCOPY (EGD) WITH PROPOFOL;  Surgeon: Danie Binder, MD;  [facility];  Service: Endoscopy;  Laterality: N/A;   ESOPHAGOGASTRODUODENOSCOPY (EGD) WITH PROPOFOL N/A 06/10/2015   Distal gastritis, distal esophageal stricture s/p dilation   ESOPHAGOGASTRODUODENOSCOPY (EGD) WITH PROPOFOL N/A 02/26/2017   Procedure: ESOPHAGOGASTRODUODENOSCOPY (EGD) WITH PROPOFOL;  Surgeon: Danie Binder, MD;  [facility];  Service: Endoscopy;  Laterality: N/A;   FLEXIBLE SIGMOIDOSCOPY  09/11/2011  SLF:MODERATE Internal hemorrhoids   HOLMIUM LASER APPLICATION Left 5/00/9381   Procedure: HOLMIUM LASER APPLICATION;  Surgeon: Franchot Gallo, MD;  [facility];  Service: Urology;  Laterality: Left;   HOLMIUM LASER APPLICATION Right 02/06/9370   Procedure: HOLMIUM LASER APPLICATION;  Surgeon: Franchot Gallo, MD;  [facility];  Service: Urology;  Laterality: Right;   HOLMIUM LASER APPLICATION  6/96/7893   Procedure: HOLMIUM LASER APPLICATION;  Surgeon: Franchot Gallo, MD;  [facility];  Service: Urology;;   HOLMIUM LASER APPLICATION Left 01/18/1750   Procedure: HOLMIUM LASER APPLICATION;  Surgeon: Franchot Gallo, MD;  [facility];  Service: Urology;   Laterality: Left;   MASS EXCISION Left 03/04/2015   Procedure: EXCISION OF SOFT TISSUE NEOPLASM LEFT ARM;  Surgeon: Aviva Signs, MD;  Location: [facility];  Service: General;  Laterality: Left;   PERCUTANEOUS NEPHROSTOLITHOTOMY Bilateral 12/  2011     [facility]   POLYPECTOMY N/A 03/17/2013   Procedure: GASTRIC POLYPECTOMY;  Surgeon: Danie Binder, MD;  [facility];  Service: Endoscopy;  Laterality: N/A;   POLYPECTOMY  02/26/2017   Procedure: POLYPECTOMY;  Surgeon: Danie Binder, MD;  [facility];  Service: Endoscopy;;  polyp at cecum, ascending colon polyps x3, hepatic flexure polyps x8, transverse colon polyps x8    POLYPECTOMY  09/22/2019   Procedure: POLYPECTOMY;  Surgeon: Danie Binder, MD;  [facility];  Service: Endoscopy;;   REMOVAL RIGHT THIGH CYST  2006   SAVORY DILATION N/A 03/17/2013   Procedure: SAVORY DILATION;  Surgeon: Danie Binder, MD;  [facility];  Service: Endoscopy;  Laterality: N/A;  #12.8, 14, 15, 16 dilators used   SAVORY DILATION N/A 06/10/2015   Procedure: SAVORY DILATION;  Surgeon: Danie Binder, MD;  [facility];  Service: Endoscopy;  Laterality: N/A;   TRANSTHORACIC ECHOCARDIOGRAM  01-01-2014   mild LVH/  ef 02-58%/  grade I diastolic dysfunction/  trivial MR, TR, and PR   VAGINAL HYSTERECTOMY  1970's    Current Medications: Outpatient Medications Prior to Visit  Medication Sig Dispense Refill   acetaminophen (TYLENOL) 500 MG tablet Take 1,000 mg by mouth every 6 (six) hours as needed for mild pain.     amLODipine (NORVASC) 10 MG tablet TAKE 1 TABLET BY MOUTH EVERY DAY 90 tablet 1   bisacodyl (DULCOLAX) 10 MG suppository Place 1 suppository (10 mg total) rectally as needed for moderate constipation. 12 suppository 0   budesonide-formoterol (SYMBICORT) 80-4.5 MCG/ACT inhaler INHALE 2 PUFFS INTO THE LUNGS TWICE A DAY 30.6 each 3   diclofenac Sodium (VOLTAREN) 1 % GEL APPLY 4 G TOPICALLY 4 (FOUR) TIMES DAILY AS NEEDED  (PAIN). 400 g 5   diphenhydramine-acetaminophen (TYLENOL PM) 25-500 MG TABS tablet Take 2 tablets by mouth at bedtime.     docusate sodium (COLACE) 100 MG capsule Take 1 capsule (100 mg total) by mouth 2 (two) times daily. 10 capsule 0   gabapentin (NEURONTIN) 100 MG capsule TAKE ONE TO TWO CAPSULES AT BEDTIME FOR NERVE PAIN 60 capsule 3   linagliptin (TRADJENTA) 5 MG TABS tablet Take 1 tablet (5 mg total) by mouth daily. 30 tablet 5   OneTouch Delica Lancets 52D MISC Once daily testing dx e11.9 100 each 5   ONETOUCH ULTRA test strip USE AS INSTRUCTED ONCE DAILY DX E11.9 100 strip 5   pantoprazole (PROTONIX) 40 MG tablet TAKE 1 TABLET BY MOUTH TWICE A DAY 180 tablet 1   PARoxetine (  PAXIL) 40 MG tablet TAKE 1 TABLET BY MOUTH EVERY DAY IN THE MORNING (Patient taking differently: Take 40 mg by mouth in the morning.) 90 tablet 1   rosuvastatin (CRESTOR) 20 MG tablet TAKE 1 TABLET BY MOUTH EVERY DAY 90 tablet 3   tamsulosin (FLOMAX) 0.4 MG CAPS capsule TAKE 1 CAPSULE BY MOUTH EVERY DAY 30 capsule 0   TRULICITY 1.5 JA/2.5KN SOPN INJECT 1.5 MG INTO THE SKIN ONCE A WEEK. 2 mL 2   UNABLE TO FIND Diabetic shoes x 1 pair, inserts x 3 pair  DX E11.9 1 each 0   No facility-administered medications prior to visit.     Allergies:   Ace inhibitors, Keflex [cephalexin], Nitrofurantoin, and Penicillins   Social History   Socioeconomic History   Marital status: Divorced    Spouse name: Not on file   Number of children: 2   Years of education: Not on file   Highest education level: Not on file  Occupational History   Occupation: retired   Tobacco Use   Smoking status: Former    Packs/day: 1.00    Years: 20.00    Pack years: 20.00    Types: Cigarettes    Start date: 09/21/1974    Quit date: 09/20/1988    Years since quitting: 32.8   Smokeless tobacco: Never   Tobacco comments:    Quit x 20 years  Vaping Use   Vaping Use: Never used  Substance and Sexual Activity   Alcohol use: No     Alcohol/week: 0.0 standard drinks   Drug use: No   Sexual activity: Never    Birth control/protection: Surgical  Other Topics Concern   Not on file  Social History Narrative   Patient is right handed   Patient drinks some caffeine daily.   Social Determinants of Health   Financial Resource Strain: Low Risk    Difficulty of Paying Living Expenses: Not hard at all  Food Insecurity: No Food Insecurity   Worried About Charity fundraiser in the Last Year: Never true   [place] in the Last Year: Never true  Transportation Needs: No Transportation Needs   Lack of Transportation (Medical): No   Lack of Transportation (Non-Medical): No  Physical Activity: Inactive   Days of Exercise per Week: 0 days   Minutes of Exercise per Session: 0 min  Stress: No Stress Concern Present   Feeling of Stress : Not at all  Social Connections: Moderately Isolated   Frequency of Communication with Friends and Family: More than three times a week   Frequency of Social Gatherings with Friends and Family: Never   Attends Religious Services: More than 4 times per year   Active Member of [employer] or Organizations: No   Attends Music therapist: Never   Marital Status: Divorced     Family History:  The patient's family history includes Arthritis in an other family member; Cancer in an other family member; Dementia in her mother; Diabetes in her brother, mother, and another family member; Emphysema in her father; GER disease in her brother; Heart defect in an other family member; Heart failure in her mother; Hypertension in her father, mother, sister, and sister.   Review of Systems:    Please see the history of present illness.     All other systems reviewed and are otherwise negative except as noted above.   Physical Exam:    VS:  BP 128/80    Pulse 95  Ht 5\' 7"  (1.702 m)    Wt 188 lb 9.6 oz (85.5 kg)    SpO2 94%    BMI 29.54 kg/m    General: Well developed, well nourished,female  appearing in no acute distress. Head: Normocephalic, atraumatic. Neck: No carotid bruits. JVD not elevated.  Lungs: Respirations regular and unlabored, without wheezes or rales.  Heart: Regular rate and rhythm. No S3 or S4.  No murmur, no rubs, or gallops appreciated. Abdomen: Appears non-distended. No obvious abdominal masses. Msk:  Strength and tone appear normal for age. No obvious joint deformities or effusions. Extremities: No clubbing or cyanosis. Trace ankle edema bilaterally.  Distal pedal pulses are 2+ bilaterally. Neuro: Alert and oriented X 3. Moves all extremities spontaneously. No focal deficits noted. Psych:  Responds to questions appropriately with a normal affect. Skin: No rashes or lesions noted  Wt Readings from Last 3 Encounters:  07/19/21 188 lb 9.6 oz (85.5 kg)  07/06/21 189 lb (85.7 kg)  06/27/21 156 lb (70.8 kg)      Studies/Labs Reviewed:   EKG:  EKG is not ordered today.    Recent Labs: 01/31/2021: TSH 1.917 05/11/2021: ALT 15 06/07/2021: Hemoglobin 11.4; Platelets 259 06/12/2021: BUN 15; Creatinine, Ser 1.89; Potassium 4.3; Sodium 140   Lipid Panel    Component Value Date/Time   CHOL 129 07/11/2021 0951   CHOL 156 10/05/2020 1036   TRIG 152 (H) 07/11/2021 0951   HDL 41 07/11/2021 0951   HDL 45 10/05/2020 1036   CHOLHDL 3.1 07/11/2021 0951   VLDL 30 07/11/2021 0951   LDLCALC 58 07/11/2021 0951   LDLCALC 83 10/05/2020 1036   LDLCALC 122 (H) 11/18/2019 0910    Additional studies/ records that were reviewed today include:   NST: 12/2013 IMPRESSION:  Low risk Lexiscan Cardiolite. There were no diagnostic ST segment  abnormalities. Normal myocardial perfusion without evidence of scar  or ischemia. LVEF is calculated at 81% with normal wall motion and  volumes.   Echocardiogram: 03/2018 Study Conclusions   - Left ventricle: The cavity size was normal. Wall thickness was    increased in a pattern of mild LVH. Systolic function was normal.    The  estimated ejection fraction was in the range of 55% to 60%.    Wall motion was normal; there were no regional wall motion    abnormalities. Indeterminate diastolic function.  - Right ventricle: The cavity size was mildly dilated.  - Right atrium: Central venous pressure (est): 3 mm Hg.  - Atrial septum: No defect or patent foramen ovale was identified.  - Tricuspid valve: There was trivial regurgitation.  - Pulmonary arteries: PA peak pressure: 21 mm Hg (S).  - Pericardium, extracardiac: There was no pericardial effusion.   Assessment:    1. Atypical chest pain   2. Bilateral lower extremity edema   3. Essential hypertension   4. Hyperlipidemia LDL goal <100   5. Stage 3b chronic kidney disease ([place])      Plan:   In order of problems listed above:  1. Chest Pain with Atypical Features - She reports episodes of sternal pain which last for a few seconds then resolve. No persistent symptoms or association with exertion. Given her fatigue and dyspnea on exertion for several years, we reviewed a possible repeat stress test due to her cardiac risk factors but she wishes to hold off on further testing at this time. Will provide Rx for SL NTG. I encouraged her to make [place] aware of any progression  of symptoms or if she changes her mind and wishes to proceed with stress testing in the interim. Would need to be a [employer] as she is unable to walk on a treadmill and would try to avoid contrast with a Coronary CT given her Stage 3 CKD.   2. Lower Extremity Edema - Symptoms have overall been stable and she only has trace edema on examination today. She has been intolerant to diuretics in the past secondary to severe cramping. Continue with conservative measures including limiting sodium intake and keeping fluid intake to ~ 2 L daily.   3. HTN - Her BP is well-controlled at 128/80 during today's visit. Continue current medication regimen with Amlodipine 10mg  daily.   4. HLD - FLP last month  showed her total cholesterol was at 129 and LDL at 58. She remains on Crestor 20mg  daily.   5. Stage 3 CKD - Creatinine was at 1.89 in 06/2021 which is similar to prior values.    Medication Adjustments/Labs and Tests Ordered: Current medicines are reviewed at length with the patient today.  Concerns regarding medicines are outlined above.  Medication changes, Labs and Tests ordered today are listed in the Patient Instructions below. Patient Instructions  Follow-Up: Follow up with Dr. Harl Bowie in 6 months.   Any Other Special Instructions Will Be Listed Below (If Applicable).     If you need a refill on your cardiac medications before your next appointment, please call your pharmacy.    Signed, Erma Heritage, PA-C  07/19/2021 7:37 PM    [facility] [address] [place], [place] [address] Phone: [phone] Fax: [phone]

## 2021-07-17 ENCOUNTER — Telehealth: Payer: Self-pay | Admitting: Family Medicine

## 2021-07-17 ENCOUNTER — Telehealth: Payer: Self-pay

## 2021-07-17 NOTE — Telephone Encounter (Signed)
Insurance does not cover her prescribed maintainace inhaler. Letter left in your area, pls identify the preferred drug and let me know soI can prescribe it. Also let pt know, thanks

## 2021-07-17 NOTE — Telephone Encounter (Signed)
-----   Message from Franchot Gallo, MD sent at 07/14/2021 12:06 PM EST ----- Notify patient that she still has some stones in her left kidney but her left kidney tube is not draining well.  I would like to get a contrasted CT to see how that side drains and if there is some scar tissue or not.  I put the order in. ----- Message ----- From: Dorisann Frames, RN Sent: 07/07/2021   8:41 AM EST To: Franchot Gallo, MD  Please review

## 2021-07-17 NOTE — Telephone Encounter (Signed)
Called patient no answer. Mailbox is full

## 2021-07-19 ENCOUNTER — Ambulatory Visit (INDEPENDENT_AMBULATORY_CARE_PROVIDER_SITE_OTHER): Payer: Medicare Other | Admitting: Student

## 2021-07-19 ENCOUNTER — Other Ambulatory Visit: Payer: Self-pay

## 2021-07-19 ENCOUNTER — Encounter: Payer: Self-pay | Admitting: Student

## 2021-07-19 VITALS — BP 128/80 | HR 95 | Ht 67.0 in | Wt 188.6 lb

## 2021-07-19 DIAGNOSIS — R0789 Other chest pain: Secondary | ICD-10-CM | POA: Diagnosis not present

## 2021-07-19 DIAGNOSIS — R600 Localized edema: Secondary | ICD-10-CM

## 2021-07-19 DIAGNOSIS — N1832 Chronic kidney disease, stage 3b: Secondary | ICD-10-CM

## 2021-07-19 DIAGNOSIS — I10 Essential (primary) hypertension: Secondary | ICD-10-CM | POA: Diagnosis not present

## 2021-07-19 DIAGNOSIS — E785 Hyperlipidemia, unspecified: Secondary | ICD-10-CM

## 2021-07-19 MED ORDER — NITROGLYCERIN 0.4 MG SL SUBL: 0.4000 mg | SUBLINGUAL_TABLET | SUBLINGUAL | 3 refills | Status: DC | PRN

## 2021-07-19 NOTE — Patient Instructions (Addendum)
Follow-Up: °Follow up with Dr. Branch in 6 months ° °Any Other Special Instructions Will Be Listed Below (If Applicable). ° ° ° ° °If you need a refill on your cardiac medications before your next appointment, please call your pharmacy. ° °

## 2021-07-20 ENCOUNTER — Other Ambulatory Visit: Payer: Self-pay | Admitting: Family Medicine

## 2021-07-20 DIAGNOSIS — R0902 Hypoxemia: Secondary | ICD-10-CM | POA: Diagnosis not present

## 2021-07-20 MED ORDER — TRELEGY ELLIPTA 100-62.5-25 MCG/ACT IN AEPB: 1.0000 | INHALATION_SPRAY | Freq: Every day | RESPIRATORY_TRACT | 11 refills | Status: DC

## 2021-07-20 NOTE — Progress Notes (Signed)
Cheryl Abbott

## 2021-07-20 NOTE — Telephone Encounter (Signed)
I think Trelegy should be covered

## 2021-07-21 ENCOUNTER — Telehealth: Payer: Self-pay

## 2021-07-21 NOTE — Telephone Encounter (Addendum)
Patient called and notified of CT = voiced understanding.   ----- Message from Franchot Gallo, MD sent at 07/14/2021 12:06 PM EST ----- Notify patient that she still has some stones in her left kidney but her left kidney tube is not draining well.  I would like to get a contrasted CT to see how that side drains and if there is some scar tissue or not.  I put the order in. ----- Message ----- From: Dorisann Frames, RN Sent: 07/07/2021   8:41 AM EST To: Franchot Gallo, MD  Please review

## 2021-07-26 NOTE — Telephone Encounter (Signed)
Pt aware of inhaler change

## 2021-07-28 ENCOUNTER — Ambulatory Visit (INDEPENDENT_AMBULATORY_CARE_PROVIDER_SITE_OTHER): Payer: Medicare Other | Admitting: Nurse Practitioner

## 2021-07-28 ENCOUNTER — Encounter (INDEPENDENT_AMBULATORY_CARE_PROVIDER_SITE_OTHER): Payer: Self-pay

## 2021-07-28 ENCOUNTER — Other Ambulatory Visit: Payer: Self-pay

## 2021-07-28 ENCOUNTER — Encounter: Payer: Self-pay | Admitting: Nurse Practitioner

## 2021-07-28 VITALS — BP 128/70 | HR 104 | Ht 67.0 in | Wt 187.0 lb

## 2021-07-28 DIAGNOSIS — W57XXXA Bitten or stung by nonvenomous insect and other nonvenomous arthropods, initial encounter: Secondary | ICD-10-CM

## 2021-07-28 DIAGNOSIS — L600 Ingrowing nail: Secondary | ICD-10-CM | POA: Diagnosis not present

## 2021-07-28 DIAGNOSIS — S90561A Insect bite (nonvenomous), right ankle, initial encounter: Secondary | ICD-10-CM

## 2021-07-28 DIAGNOSIS — S90569A Insect bite (nonvenomous), unspecified ankle, initial encounter: Secondary | ICD-10-CM | POA: Insufficient documentation

## 2021-07-28 DIAGNOSIS — I10 Essential (primary) hypertension: Secondary | ICD-10-CM

## 2021-07-28 HISTORY — DX: Bitten or stung by nonvenomous insect and other nonvenomous arthropods, initial encounter: W57.XXXA

## 2021-07-28 MED ORDER — TRIAMCINOLONE ACETONIDE 0.1 % EX CREA: 1.0000 "application " | TOPICAL_CREAM | Freq: Two times a day (BID) | CUTANEOUS | 0 refills | Status: DC

## 2021-07-28 NOTE — Progress Notes (Signed)
° °  Cheryl Abbott     MRN: [id number]      DOB: [birth year]   HPI Cheryl Abbott is here for c/o tick bite to her right ankle 2 days ago. She has not been in the woods but she thinks she got it from a from her caregiver that he has been working fixing old houses. Pt stated that she saw the tick and pulled it out of her skin. She has never been bitten by a tick before but she knows what it looks like. States that he site is swollen, a little bit sore she put some neosporin and bandage over it . Denies fever , chills,   c/o her bilateral big toes pain , she has ingrown toes nails on both big toes , her toes hurts when walking, she saw a podiatry years ago,pt has diabetes and stated that she wears her diabetic shoes.        ROS Denies recent fever or chills. Denies sinus pressure, nasal congestion, ear pain or sore throat. Denies chest congestion, productive cough or wheezing. Denies chest pains, palpitations and leg swelling Denies abdominal pain, nausea, vomiting,diarrhea or constipation.   Denies dysuria, frequency, hesitancy or incontinence. Big toes  joint pain, swelling and limitation in mobility. Denies headaches, seizures, numbness, or tingling. Denies depression, anxiety or insomnia.  Has ingrown toe nails  on both big toes, has tick bite on her right ankle PE  BP 128/70    Pulse (!) 104    Ht 5\' 7"  (1.702 m)    Wt 187 lb (84.8 kg)    SpO2 94%    BMI 29.29 kg/m   Patient alert and oriented and in no cardiopulmonary distress. .  Chest: Clear to auscultation bilaterally.  CVS: S1, S2 no murmurs, no S3.Regular rate.  ABD: Soft non tender.   Ext: No edema  MS: Decreased ROM spine, shoulders, hips and knees. Able to do ROM of toes and ankles  Skin: open skin spot noted on right ankle on the medial aspect, no drainage, no redness, mildly swollen,  has  ingrown toe on both big toes,, site is tender to touch, toe nails are discolored, no redness, no drainage, skin is intact , skin  warm and dry  Psych: Good eye contact, normal affect. Memory intact not anxious or depressed appearing.     Assessment & Plan

## 2021-07-28 NOTE — Assessment & Plan Note (Signed)
BP Readings from Last 3 Encounters:  07/28/21 128/70  07/19/21 128/80  07/06/21 106/76  Blood pressure at goal continue amlodipine 10 mg daily

## 2021-07-28 NOTE — Assessment & Plan Note (Addendum)
Site appears clean and dry, no erythema no drainage. Patient does not want to take antibiotics at this time, tick bites less than 72 hours.  Stated that she got the whole tick out.Patient told to monitor site, call the office if she develops rash at the site of bite.  If patient develops EM rash at site of bite would  treatment for Lyme disease Patient told to keep site clean and dry.

## 2021-07-28 NOTE — Patient Instructions (Addendum)
Pleas call the office if your tick bite site develops a big rash. Please apply kenalog cream twice daily to your big toes and follow up with podiatry as we discussed.   It is important that you exercise regularly at least 30 minutes 5 times a week.  Think about what you will eat, plan ahead. Choose " clean, green, fresh or frozen" over canned, processed or packaged foods which are more sugary, salty and fatty. 70 to 75% of food eaten should be vegetables and fruit. Three meals at set times with snacks allowed between meals, but they must be fruit or vegetables. Aim to eat over a 12 hour period , example 7 am to 7 pm, and STOP after  your last meal of the day. Drink water,generally about 64 ounces per day, no other drink is as healthy. Fruit juice is best enjoyed in a healthy way, by EATING the fruit.  Thanks for choosing [facility], we consider it a privelige to serve you.

## 2021-07-28 NOTE — Assessment & Plan Note (Addendum)
Rx Kenalog 0.1% cream apply 2 times daily to affected toes, use tylenol as needed for pain  Patient told to wear her diabetic shoes when walking Follow-up with podiatry, referral for podiatry made today, need for yearly podiatry examination due to her diabetes discussed with patient she verbalized understanding

## 2021-08-04 ENCOUNTER — Other Ambulatory Visit: Payer: Self-pay | Admitting: Family Medicine

## 2021-08-07 ENCOUNTER — Other Ambulatory Visit: Payer: Self-pay | Admitting: Nurse Practitioner

## 2021-08-07 ENCOUNTER — Other Ambulatory Visit: Payer: Self-pay | Admitting: Family Medicine

## 2021-08-07 DIAGNOSIS — L600 Ingrowing nail: Secondary | ICD-10-CM

## 2021-08-09 ENCOUNTER — Ambulatory Visit: Payer: Medicare Other | Admitting: Cardiology

## 2021-08-11 ENCOUNTER — Other Ambulatory Visit: Payer: Self-pay

## 2021-08-11 ENCOUNTER — Ambulatory Visit (HOSPITAL_COMMUNITY)
Admission: RE | Admit: 2021-08-11 | Discharge: 2021-08-11 | Disposition: A | Discharge status: Home / Self Care | Payer: Medicare Other | Source: Ambulatory Visit | Attending: Urology | Admitting: Urology

## 2021-08-11 DIAGNOSIS — K5730 Diverticulosis of large intestine without perforation or abscess without bleeding: Secondary | ICD-10-CM | POA: Diagnosis not present

## 2021-08-11 DIAGNOSIS — N200 Calculus of kidney: Secondary | ICD-10-CM | POA: Diagnosis not present

## 2021-08-11 DIAGNOSIS — N1330 Unspecified hydronephrosis: Secondary | ICD-10-CM | POA: Diagnosis not present

## 2021-08-11 DIAGNOSIS — N134 Hydroureter: Secondary | ICD-10-CM | POA: Diagnosis not present

## 2021-08-11 DIAGNOSIS — N132 Hydronephrosis with renal and ureteral calculous obstruction: Secondary | ICD-10-CM | POA: Diagnosis not present

## 2021-08-11 LAB — POCT I-STAT CREATININE: Creatinine, Ser: 1.7 mg/dL — ABNORMAL HIGH (ref 0.44–1.00)

## 2021-08-11 MED ORDER — IOHEXOL 300 MG/ML  SOLN
75.0000 mL | Freq: Once | INTRAMUSCULAR | Status: AC | PRN
  Administered 2021-08-11: 75 mL via INTRAVENOUS

## 2021-08-15 DIAGNOSIS — E119 Type 2 diabetes mellitus without complications: Secondary | ICD-10-CM | POA: Diagnosis not present

## 2021-08-15 LAB — HM DIABETES EYE EXAM

## 2021-08-16 ENCOUNTER — Inpatient Hospital Stay (HOSPITAL_COMMUNITY): Payer: Medicare Other | Attending: Hematology

## 2021-08-16 ENCOUNTER — Other Ambulatory Visit: Payer: Self-pay

## 2021-08-16 DIAGNOSIS — E785 Hyperlipidemia, unspecified: Secondary | ICD-10-CM | POA: Insufficient documentation

## 2021-08-16 DIAGNOSIS — Z87891 Personal history of nicotine dependence: Secondary | ICD-10-CM | POA: Insufficient documentation

## 2021-08-16 DIAGNOSIS — C9000 Multiple myeloma not having achieved remission: Secondary | ICD-10-CM | POA: Insufficient documentation

## 2021-08-16 DIAGNOSIS — D509 Iron deficiency anemia, unspecified: Secondary | ICD-10-CM | POA: Diagnosis not present

## 2021-08-16 DIAGNOSIS — D472 Monoclonal gammopathy: Secondary | ICD-10-CM

## 2021-08-16 DIAGNOSIS — K219 Gastro-esophageal reflux disease without esophagitis: Secondary | ICD-10-CM | POA: Diagnosis not present

## 2021-08-16 DIAGNOSIS — Z79899 Other long term (current) drug therapy: Secondary | ICD-10-CM | POA: Diagnosis not present

## 2021-08-16 LAB — CBC WITH DIFFERENTIAL/PLATELET
Abs Immature Granulocytes: 0.05 10*3/uL (ref 0.00–0.07)
Basophils Absolute: 0.1 10*3/uL (ref 0.0–0.1)
Basophils Relative: 1 %
Eosinophils Absolute: 0.6 10*3/uL — ABNORMAL HIGH (ref 0.0–0.5)
Eosinophils Relative: 7 %
HCT: 41.8 % (ref 36.0–46.0)
Hemoglobin: 13.2 g/dL (ref 12.0–15.0)
Immature Granulocytes: 1 %
Lymphocytes Relative: 15 %
Lymphs Abs: 1.2 10*3/uL (ref 0.7–4.0)
MCH: 33 pg (ref 26.0–34.0)
MCHC: 31.6 g/dL (ref 30.0–36.0)
MCV: 104.5 fL — ABNORMAL HIGH (ref 80.0–100.0)
Monocytes Absolute: 0.5 10*3/uL (ref 0.1–1.0)
Monocytes Relative: 6 %
Neutro Abs: 6 10*3/uL (ref 1.7–7.7)
Neutrophils Relative %: 70 %
Platelets: 267 10*3/uL (ref 150–400)
RBC: 4 MIL/uL (ref 3.87–5.11)
RDW: 14.8 % (ref 11.5–15.5)
WBC: 8.4 10*3/uL (ref 4.0–10.5)
nRBC: 0 % (ref 0.0–0.2)

## 2021-08-16 LAB — COMPREHENSIVE METABOLIC PANEL
ALT: 16 U/L (ref 0–44)
AST: 16 U/L (ref 15–41)
Albumin: 4.2 g/dL (ref 3.5–5.0)
Alkaline Phosphatase: 86 U/L (ref 38–126)
Anion gap: 9 (ref 5–15)
BUN: 19 mg/dL (ref 8–23)
CO2: 22 mmol/L (ref 22–32)
Calcium: 10 mg/dL (ref 8.9–10.3)
Chloride: 102 mmol/L (ref 98–111)
Creatinine, Ser: 1.7 mg/dL — ABNORMAL HIGH (ref 0.44–1.00)
GFR, Estimated: 31 mL/min — ABNORMAL LOW (ref >60)
Glucose, Bld: 201 mg/dL — ABNORMAL HIGH (ref 70–99)
Potassium: 3.6 mmol/L (ref 3.5–5.1)
Sodium: 133 mmol/L — ABNORMAL LOW (ref 135–145)
Total Bilirubin: 0.4 mg/dL (ref 0.3–1.2)
Total Protein: 9.1 g/dL — ABNORMAL HIGH (ref 6.5–8.1)

## 2021-08-16 LAB — IRON AND TIBC
Iron: 81 ug/dL (ref 28–170)
Saturation Ratios: 19 % (ref 10.4–31.8)
TIBC: 419 ug/dL (ref 250–450)
UIBC: 338 ug/dL

## 2021-08-16 LAB — LACTATE DEHYDROGENASE: LDH: 114 U/L (ref 98–192)

## 2021-08-16 LAB — FERRITIN: Ferritin: 36 ng/mL (ref 11–307)

## 2021-08-17 DIAGNOSIS — R0902 Hypoxemia: Secondary | ICD-10-CM | POA: Diagnosis not present

## 2021-08-17 LAB — KAPPA/LAMBDA LIGHT CHAINS
Kappa free light chain: 53.6 mg/L — ABNORMAL HIGH (ref 3.3–19.4)
Kappa, lambda light chain ratio: 3.39 — ABNORMAL HIGH (ref 0.26–1.65)
Lambda free light chains: 15.8 mg/L (ref 5.7–26.3)

## 2021-08-21 LAB — PROTEIN ELECTROPHORESIS, SERUM
A/G Ratio: 0.8 (ref 0.7–1.7)
Albumin ELP: 3.9 g/dL (ref 2.9–4.4)
Alpha-1-Globulin: 0.2 g/dL (ref 0.0–0.4)
Alpha-2-Globulin: 1.1 g/dL — ABNORMAL HIGH (ref 0.4–1.0)
Beta Globulin: 1.3 g/dL (ref 0.7–1.3)
Gamma Globulin: 2 g/dL — ABNORMAL HIGH (ref 0.4–1.8)
Globulin, Total: 4.6 g/dL — ABNORMAL HIGH (ref 2.2–3.9)
M-Spike, %: 1.5 g/dL — ABNORMAL HIGH
Total Protein ELP: 8.5 g/dL (ref 6.0–8.5)

## 2021-08-22 NOTE — Progress Notes (Addendum)
? ?[facility] ?[address] ?[place], [place] [address] ? ? ?CLINIC:  ?Medical Oncology/Hematology ? ?PCP:  ?[name], MD ?[address], [address] ?[place] [address] ?[phone] ? ? ?REASON FOR VISIT:  ?Follow-up for IgG kappa smoldering myeloma & iron deficiency anemia ?  ?PRIOR THERAPY: Intermittent IV iron infusions ?  ?CURRENT THERAPY: Observation ?  ?INTERVAL HISTORY:  ?Cheryl Abbott 76 y.o. female returns for routine follow-up of her IgG kappa smoldering myeloma and her iron deficiency anemia.  She was last seen by Tarri Abernethy PA-C on 05/18/2021. ? ?At today's visit, she reports feeling at her usual baseline.  No recent hospitalizations, surgeries, or changes in baseline health status. ? ?Regarding her smoldering myeloma, she has not noted any new symptoms.  Her chief concern is the pain in her left shoulder which she says she has seen neurosurgeon for and is related to a pinched nerve in her neck.  She denies any new bone pain or fractures.  She continues to have night sweats, but denies any fever, chills, or unintentional weight loss.  She admits to tinnitus as well as numbness in her feet.  No new onset hearing loss, blurred vision, headache, or dizziness.   ?No thromboembolic events.  No new masses or lymphadenopathy. ? ?She has occasional rectal bleeding from hemorrhoids when she strains for a bowel movement, but denies any massive hematochezia or any hematemesis, melena, or epistaxis. ?She is symptomatic with significant fatigue that was unfortunately not improved by last IV iron. ?She has symptoms of restless legs and dyspnea on exertion.   ?No pica, chest pain, lightheadedness, or syncope. ? ?She has little to no energy and 50% appetite. She endorses that she is maintaining a stable weight. ? ? ?REVIEW OF SYSTEMS:  ?Review of Systems  ?Constitutional:  Positive for fatigue. Negative for appetite change, chills, diaphoresis, fever and unexpected weight change.  ?HENT:    Positive for trouble swallowing. Negative for lump/mass and nosebleeds.   ?Eyes:  Negative for eye problems.  ?Respiratory:  Positive for shortness of breath. Negative for cough and hemoptysis.   ?Cardiovascular:  Negative for chest pain, leg swelling and palpitations.  ?Gastrointestinal:  Positive for blood in stool (rectal bleeding frmo hemorrhoids), constipation and nausea. Negative for abdominal pain, diarrhea and vomiting.  ?Genitourinary:  Positive for frequency. Negative for hematuria.   ?Musculoskeletal:  Positive for arthralgias.  ?Skin: Negative.   ?Neurological:  Positive for dizziness and headaches. Negative for light-headedness.  ?Hematological:  Does not bruise/bleed easily.  ?Psychiatric/Behavioral:  Positive for sleep disturbance.    ? ? ?PAST MEDICAL/SURGICAL HISTORY:  ?Past Medical History:  ?Diagnosis Date  ? Abnormal brain scan 02/07/2012  ? Anemia   ? Anxiety   ? Anxiety disorder   ? Arthritis   ? Brain TIA 08/19/2015  ? Cancer [facility])   ? acute myeloma  ? 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 Chronic back pain   ? Depression   ? Fatty liver   ? GERD (gastroesophageal reflux disease)   ? Headache   ? HEMORRHOIDS 03/15/2009  ? OCT 2014 HB 12. 9    ? History of adenomatous polyp of colon   ? 2008  ? History of kidney stones   ? Hypercalcemia   ? Hypercalcemia 11/30/2010  ? Hyperlipidemia   ? Hypertension   ? Lactose intolerance in adult 02/01/2016  ? Left ureteral stone   ? Lipoma of arm 01/19/2015  ? Lumbar disc disease with radiculopathy   ?  Nephrolithiasis   ? Neuropathic pain of both feet 04/11/2016  ? Neuropathy, peripheral   ? Nocturnal hypoxia 02/04/2014  ? Study confirms this done 02/10/2014   ? OSA treated with BiPAP   ? per study 2007  ? RENAL CALCULUS 03/15/2009  ? Qualifier: Diagnosis of  By: Zeb Comfort    ? Sciatica   ? Shoulder pain, bilateral   ? Sigmoid diverticulosis   ? TIA (transient ischemic attack) 02/26/2016  ? Type 2 diabetes mellitus ([place])   ? Vaginal dryness, menopausal  10/14/2016  ? ?Past Surgical History:  ?Procedure Laterality Date  ? BIOPSY N/A 03/17/2013  ? Procedure: GASTRIC BIOPSIES;  Surgeon: Danie Binder, MD;  Location: [facility];  Service: Endoscopy;  Laterality: N/A;  ? BIOPSY  06/10/2015  ? Procedure: BIOPSY;  Surgeon: Danie Binder, MD;  [facility];  Service: Endoscopy;;  gastric biopsy  ? CARDIAC CATHETERIZATION  05-11-2003  dr Shelva Majestic ([facility])  ? Abnormal cardiolite/   Normal coronary arteries and normal LVF,  ef  63%  ? CARDIOVASCULAR STRESS TEST  01-01-2014  ? normal lexiscan cardiolite/  no ischemia/ infarct/  normal LV function and wall motion ,  ef 81%  ? COLONOSCOPY  last one 10/02/2011  ? MOD [place] TICS, IH, NEXT TCS APR 2018  ? COLONOSCOPY WITH PROPOFOL N/A 02/26/2017  ? Procedure: COLONOSCOPY WITH PROPOFOL;  Surgeon: Danie Binder, MD;  Location[facility];  Service: Endoscopy;  Laterality: N/A;  11:00am  ? COLONOSCOPY WITH PROPOFOL N/A 09/22/2019  ? Procedure: COLONOSCOPY WITH PROPOFOL;  Surgeon: Danie Binder, MD;  [facility];  Service: Endoscopy;  Laterality: N/A;  1:15  ? CYSTO/   RIGHT URETEROSOCOPY LASER LITHOTRIPSY STONE EXTRACTION  10-13-2004  ? CYSTO/  RIGHT RETROGRADE PYELOGRAM/  PLACMENT RIGHT URETERAL STENT  01-10-2010  ? CYSTOSCOPY W/ URETERAL STENT PLACEMENT Right 08/19/2015  ? Procedure: CYSTOSCOPY WITH RETROGRADE PYELOGRAM/URETERAL STENT PLACEMENT;  Surgeon: Franchot Gallo, MD;  Location: [facility];  Service: Urology;  Laterality: Right;  ? CYSTOSCOPY W/ URETERAL STENT PLACEMENT Left 02/23/2020  ? Procedure: CYSTOSCOPY LEFT  RETROGRADE PYELOGRAM LEFT URETEROSCOPY URETERAL STENT PLACEMENT;  Surgeon: Franchot Gallo, MD;  Location: [facility];  Service: Urology;  Laterality: Left;  ? CYSTOSCOPY WITH RETROGRADE PYELOGRAM, URETEROSCOPY AND STENT PLACEMENT Left 10/21/2014  ? Procedure: [place], URETEROSCOPY AND STENT PLACEMENT;  Surgeon: Franchot Gallo, MD;  Location:  [facility];  Service: Urology;  Laterality: Left;  ? CYSTOSCOPY WITH RETROGRADE PYELOGRAM, URETEROSCOPY AND STENT PLACEMENT Right 11/22/2015  ? Procedure: CYSTOSCOPY, RIGHT URETERAL STENT REMOVAL; RIGHT RETROGRADE PYELOGRAM, RIGHT URETEROSCOPY WITH BALLOON DILATION; RIGHT URETERAL STENT PLACEMENT;  Surgeon: Franchot Gallo, MD;  Location[facility];  Service: Urology;  Laterality: Right;  ? CYSTOSCOPY WITH RETROGRADE PYELOGRAM, URETEROSCOPY AND STENT PLACEMENT Left 10/18/2020  ? Procedure: CYSTOSCOPY WITH LEFT RETROGRADE PYELOGRAM, LEFT URETEROSCOPY  WITH LASER AND LEFT URETERAL STENT PLACEMENT;  Surgeon: Franchot Gallo, MD;  [facility];  Service: Urology;  Laterality: Left;  ? CYSTOSCOPY/RETROGRADE/URETEROSCOPY/STONE EXTRACTION WITH BASKET Bilateral 08/02/2015  ? Procedure: CYSTOSCOPY; BILATERAL RETROGRADE PYELOGRAMS; BILATERAL URETEROSCOPY, STONE EXTRACTION WITH BASKET;  Surgeon: Franchot Gallo, MD;  [facility];  Service: Urology;  Laterality: Bilateral;  ? CYSTOSCOPY/RETROGRADE/URETEROSCOPY/STONE EXTRACTION WITH BASKET Right 06/28/2015  ? Procedure: CYSTOSCOPY/RETROGRADE/URETEROSCOPY/STONE EXTRACTION WITH BASKET, RIGHT URETERAL DOUBLE J STENT PLACEMENT;  Surgeon: Franchot Gallo, MD;  [facility];  Service: Urology;  Laterality: Right;  ? ESOPHAGOGASTRODUODENOSCOPY (EGD) WITH PROPOFOL N/A  03/17/2013  ? Procedure: ESOPHAGOGASTRODUODENOSCOPY (EGD) WITH PROPOFOL;  Surgeon: Danie Binder, MD;  [facility];  Service: Endoscopy;  Laterality: N/A;  ? ESOPHAGOGASTRODUODENOSCOPY (EGD) WITH PROPOFOL N/A 06/10/2015  ? Distal gastritis, distal esophageal stricture s/p dilation  ? ESOPHAGOGASTRODUODENOSCOPY (EGD) WITH PROPOFOL N/A 02/26/2017  ? Procedure: ESOPHAGOGASTRODUODENOSCOPY (EGD) WITH PROPOFOL;  Surgeon: Danie Binder, MD;  [facility];  Service: Endoscopy;  Laterality: N/A;  ? FLEXIBLE SIGMOIDOSCOPY  09/11/2011  ? [id number] Internal hemorrhoids  ? HOLMIUM LASER  APPLICATION Left 6/75/9163  ? Procedure: HOLMIUM LASER APPLICATION;  Surgeon: Franchot Gallo, MD;  Location: [facility];  Service: Urology;  Laterality: Left;  ? HOLMIUM LASER APPLICATION

## 2021-08-23 ENCOUNTER — Encounter (HOSPITAL_COMMUNITY): Payer: Self-pay | Admitting: Physician Assistant

## 2021-08-23 ENCOUNTER — Telehealth: Payer: Self-pay

## 2021-08-23 ENCOUNTER — Inpatient Hospital Stay (HOSPITAL_BASED_OUTPATIENT_CLINIC_OR_DEPARTMENT_OTHER): Payer: Medicare Other | Admitting: Physician Assistant

## 2021-08-23 ENCOUNTER — Other Ambulatory Visit: Payer: Self-pay

## 2021-08-23 VITALS — BP 125/75 | HR 102 | Temp 98.1°F | Resp 20 | Wt 190.9 lb

## 2021-08-23 DIAGNOSIS — D509 Iron deficiency anemia, unspecified: Secondary | ICD-10-CM

## 2021-08-23 DIAGNOSIS — Z79899 Other long term (current) drug therapy: Secondary | ICD-10-CM | POA: Diagnosis not present

## 2021-08-23 DIAGNOSIS — D472 Monoclonal gammopathy: Secondary | ICD-10-CM

## 2021-08-23 DIAGNOSIS — K219 Gastro-esophageal reflux disease without esophagitis: Secondary | ICD-10-CM | POA: Diagnosis not present

## 2021-08-23 DIAGNOSIS — E785 Hyperlipidemia, unspecified: Secondary | ICD-10-CM | POA: Diagnosis not present

## 2021-08-23 DIAGNOSIS — C9000 Multiple myeloma not having achieved remission: Secondary | ICD-10-CM | POA: Diagnosis not present

## 2021-08-23 DIAGNOSIS — Z87891 Personal history of nicotine dependence: Secondary | ICD-10-CM | POA: Diagnosis not present

## 2021-08-23 NOTE — Patient Instructions (Signed)
[facility] at [facility] ?Discharge Instructions ? ?You were seen today by Tarri Abernethy PA-C for your iron deficiency anemia and your smoldering myeloma. ? ?IRON DEFICIENCY: Your iron levels are low today, so I recommend treatment with IV iron (Feraheme) x2 doses.  We will recheck your levels at your follow-up visit in 3 months. ? ?SMOLDERING MYELOMA: You continue to be in the "gray area" between normal blood and full-blown blood cancer (multiple myeloma).  We will continue to monitor your labs closely, and we will see you for follow-up in 3 months with a full lab panel as well as repeat x-rays of your body. ? ? ?LABS: Return in 3 months for repeat labs ? ?OTHER TESTS: X-ray of whole body ? ?TREATMENT: IV Feraheme (iron infusion) x2 doses ? ?FOLLOW-UP APPOINTMENT: Office visit in 3 months, after labs and x-ray ? ? ?Thank you for choosing [facility] at [facility] to provide your oncology and hematology care.  To afford each patient quality time with our provider, please arrive at least 15 minutes before your scheduled appointment time.  ? ?If you have a lab appointment with the [facility] please come in thru the Main Entrance and check in at the main information desk. ? ?You need to re-schedule your appointment should you arrive 10 or more minutes late.  We strive to give you quality time with our providers, and arriving late affects you and other patients whose appointments are after yours.  Also, if you no show three or more times for appointments you may be dismissed from the clinic at the providers discretion.     ?Again, thank you for choosing [facility].  Our hope is that these requests will decrease the amount of time that you wait before being seen by our physicians.       ?_____________________________________________________________ ? ?Should you have questions after your visit to [facility], please contact our office at  [phone] and follow the prompts.  Our office hours are 8:00 a.m. and 4:30 p.m. Monday - Friday.  Please note that voicemails left after 4:00 p.m. may not be returned until the following business day.  We are closed weekends and major holidays.  You do have access to a nurse 24-7, just call the main number to the clinic [phone] and do not press any options, hold on the line and a nurse will answer the phone.   ? ?For prescription refill requests, have your pharmacy contact our office and allow 72 hours.   ? ?Due to Covid, you will need to wear a mask upon entering the hospital. If you do not have a mask, a mask will be given to you at the Main Entrance upon arrival. For doctor visits, patients may have 1 support person age 49 or older with them. For treatment visits, patients can not have anyone with them due to social distancing guidelines and our immunocompromised population.  ? ? ? ?

## 2021-08-23 NOTE — Telephone Encounter (Signed)
-----   Message from Franchot Gallo, MD sent at 08/18/2021 12:20 PM EST ----- ?Please call patient-it looks like she has a small stone in the left ureter.  That is causing some blockage.  I think we need to look at scheduling a procedure to have that taken care of.  If she wants, she can have one of the docs that go to [facility] do it. ?----- Message ----- ?From: Dorisann Frames, RN ?Sent: 08/14/2021   8:56 AM EST ?To: Franchot Gallo, MD ? ?Please review ? ?

## 2021-08-23 NOTE — Telephone Encounter (Signed)
Mailbox is full. Unable to leave message

## 2021-08-25 ENCOUNTER — Telehealth: Payer: Self-pay

## 2021-08-25 NOTE — Telephone Encounter (Signed)
Patient would to proceed with procedure at [facility]. ? ?Message sent to MD ?

## 2021-08-25 NOTE — Telephone Encounter (Signed)
-----   Message from Franchot Gallo, MD sent at 08/18/2021 12:20 PM EST ----- ?Please call patient-it looks like she has a small stone in the left ureter.  That is causing some blockage.  I think we need to look at scheduling a procedure to have that taken care of.  If she wants, she can have one of the docs that go to [facility] do it. ?----- Message ----- ?From: Dorisann Frames, RN ?Sent: 08/14/2021   8:56 AM EST ?To: Franchot Gallo, MD ? ?Please review ? ?

## 2021-08-28 ENCOUNTER — Other Ambulatory Visit: Payer: Self-pay

## 2021-08-28 ENCOUNTER — Ambulatory Visit (INDEPENDENT_AMBULATORY_CARE_PROVIDER_SITE_OTHER): Payer: Medicare Other | Admitting: Orthopedic Surgery

## 2021-08-28 DIAGNOSIS — M25512 Pain in left shoulder: Secondary | ICD-10-CM

## 2021-08-28 DIAGNOSIS — G8929 Other chronic pain: Secondary | ICD-10-CM | POA: Diagnosis not present

## 2021-08-28 MED ORDER — OXYCODONE-ACETAMINOPHEN 5-325 MG PO TABS: 1.0000 | ORAL_TABLET | Freq: Three times a day (TID) | ORAL | 0 refills | Status: AC | PRN

## 2021-08-28 NOTE — Patient Instructions (Signed)

## 2021-08-28 NOTE — Progress Notes (Signed)
Chief Complaint  ?Patient presents with  ? Shoulder Pain  ?  LEFT shoulder/ hurts at night x 2 weeks  ? ? ?Cheryl Abbott comes in again with left shoulder  ? ?She continues to have difficulty with pain despite taking some gabapentin and Tylenol she saw neurosurgery the gabapentin seem to be working after the recommended she take that ? ?She had an MRI which showed a moderate cuff tendinopathy and interstitial tears bursal and articular sided fraying and fibrillation without full-thickness tear biceps tendon was intact moderate glenohumeral joint arthritis was also noted. ? ?She requested an injection and something stronger for pain ? ?I tried to inject the shoulder but she was moving quite a bit ? ?I injected it again the quality of the injection would be moderate at best ? ?Encounter Diagnosis  ?Name Primary?  ? Chronic left shoulder pain Yes  ? ? ? ?Procedure note the subacromial injection shoulder left  ? ?Verbal consent was obtained to inject the  Left   Shoulder ? ?Timeout was completed to confirm the injection site is a subacromial space of the  left  shoulder ? ?Medication used Depo-Medrol 40 mg and lidocaine 1% 3 cc ? ?Anesthesia was provided by ethyl chloride ? ?The injection was performed in the left  posterior subacromial space. After pinning the skin with alcohol and anesthetized the skin with ethyl chloride the subacromial space was injected using a 20-gauge needle. There were no complications ? ?Sterile dressing was applied. ? ? ?Follow-up as needed I gave her some pain medication in case she has injection site pain then she should return to tramadol ? ? ? ?  ?Meds ordered this encounter  ?Medications  ? oxyCODONE-acetaminophen (PERCOCET/ROXICET) 5-325 MG tablet  ?  Sig: Take 1 tablet by mouth every 8 (eight) hours as needed for up to 5 days for severe pain.  ?  Dispense:  15 tablet  ?  Refill:  0  ? ? ?

## 2021-08-30 ENCOUNTER — Other Ambulatory Visit: Payer: Self-pay

## 2021-08-30 ENCOUNTER — Inpatient Hospital Stay (HOSPITAL_COMMUNITY): Payer: Medicare Other

## 2021-08-30 ENCOUNTER — Encounter (HOSPITAL_COMMUNITY): Payer: Self-pay

## 2021-08-30 VITALS — BP 122/78 | HR 87 | Temp 98.5°F | Resp 20

## 2021-08-30 DIAGNOSIS — C9000 Multiple myeloma not having achieved remission: Secondary | ICD-10-CM | POA: Diagnosis not present

## 2021-08-30 DIAGNOSIS — K219 Gastro-esophageal reflux disease without esophagitis: Secondary | ICD-10-CM | POA: Diagnosis not present

## 2021-08-30 DIAGNOSIS — Z79899 Other long term (current) drug therapy: Secondary | ICD-10-CM | POA: Diagnosis not present

## 2021-08-30 DIAGNOSIS — E785 Hyperlipidemia, unspecified: Secondary | ICD-10-CM | POA: Diagnosis not present

## 2021-08-30 DIAGNOSIS — D509 Iron deficiency anemia, unspecified: Secondary | ICD-10-CM | POA: Diagnosis not present

## 2021-08-30 DIAGNOSIS — Z87891 Personal history of nicotine dependence: Secondary | ICD-10-CM | POA: Diagnosis not present

## 2021-08-30 DIAGNOSIS — D508 Other iron deficiency anemias: Secondary | ICD-10-CM

## 2021-08-30 DIAGNOSIS — N201 Calculus of ureter: Secondary | ICD-10-CM | POA: Diagnosis not present

## 2021-08-30 MED ORDER — SODIUM CHLORIDE 0.9 % IV SOLN: Freq: Once | INTRAVENOUS | Status: AC

## 2021-08-30 MED ORDER — SODIUM CHLORIDE 0.9 % IV SOLN
510.0000 mg | Freq: Once | INTRAVENOUS | Status: AC
  Administered 2021-08-30: 510 mg via INTRAVENOUS
  Filled 2021-08-30: qty 510

## 2021-08-30 MED ORDER — ACETAMINOPHEN 325 MG PO TABS
650.0000 mg | ORAL_TABLET | Freq: Once | ORAL | Status: AC
  Administered 2021-08-30: 650 mg via ORAL
  Filled 2021-08-30: qty 2

## 2021-08-30 MED ORDER — LORATADINE 10 MG PO TABS
10.0000 mg | ORAL_TABLET | Freq: Once | ORAL | Status: AC
  Administered 2021-08-30: 10 mg via ORAL
  Filled 2021-08-30: qty 1

## 2021-08-30 NOTE — Patient Instructions (Signed)
[facility]  Discharge Instructions: ?Thank you for choosing [facility] to provide your oncology and hematology care.  ?If you have a lab appointment with the [facility], please come in thru the Main Entrance and check in at the main information desk. ? ?Wear comfortable clothing and clothing appropriate for easy access to any Portacath or PICC line.  ? ?We strive to give you quality time with your provider. You may need to reschedule your appointment if you arrive late (15 or more minutes).  Arriving late affects you and other patients whose appointments are after yours.  Also, if you miss three or more appointments without notifying the office, you may be dismissed from the clinic at the provider?s discretion.    ?  ?For prescription refill requests, have your pharmacy contact our office and allow 72 hours for refills to be completed.   ? ?Today you received the following : Feraheme infusion.     ?  ?To help prevent nausea and vomiting after your treatment, we encourage you to take your nausea medication as directed. ? ?BELOW ARE SYMPTOMS THAT SHOULD BE REPORTED IMMEDIATELY: ?*FEVER GREATER THAN 100.4 F (38 ?C) OR HIGHER ?*CHILLS OR SWEATING ?*NAUSEA AND VOMITING THAT IS NOT CONTROLLED WITH YOUR NAUSEA MEDICATION ?*UNUSUAL SHORTNESS OF BREATH ?*UNUSUAL BRUISING OR BLEEDING ?*URINARY PROBLEMS (pain or burning when urinating, or frequent urination) ?*BOWEL PROBLEMS (unusual diarrhea, constipation, pain near the anus) ?TENDERNESS IN MOUTH AND THROAT WITH OR WITHOUT PRESENCE OF ULCERS (sore throat, sores in mouth, or a toothache) ?UNUSUAL RASH, SWELLING OR PAIN  ?UNUSUAL VAGINAL DISCHARGE OR ITCHING  ? ?Items with * indicate a potential emergency and should be followed up as soon as possible or go to the Emergency Department if any problems should occur. ? ?Please show the CHEMOTHERAPY ALERT CARD or IMMUNOTHERAPY ALERT CARD at check-in to the Emergency Department and triage  nurse. ? ?Should you have questions after your visit or need to cancel or reschedule your appointment, please contact [facility] [phone]  and follow the prompts.  Office hours are 8:00 a.m. to 4:30 p.m. Monday - Friday. Please note that voicemails left after 4:00 p.m. may not be returned until the following business day.  We are closed weekends and major holidays. You have access to a nurse at all times for urgent questions. Please call the main number to the clinic [phone] and follow the prompts. ? ?For any non-urgent questions, you may also contact your provider using MyChart. We now offer e-Visits for anyone 53 and older to request care online for non-urgent symptoms. For details visit [email or web address]. ?  ?Also download the MyChart app! Go to the app store, search "MyChart", open the app, select [facility], and log in with your MyChart username and password. ? ?Due to Covid, a mask is required upon entering the hospital/clinic. If you do not have a mask, one will be given to you upon arrival. For doctor visits, patients may have 1 support person aged 18 or older with them. For treatment visits, patients cannot have anyone with them due to current Covid guidelines and our immunocompromised population.  ?

## 2021-08-30 NOTE — Progress Notes (Signed)
Patient presents today for Feraheme infusion. Patient has no complaints today. MAR reviewed and updated. Vital signs stable.  ? ?Feraheme given today per MD orders. Tolerated infusion without adverse affects. Vital signs stable. No complaints at this time. Discharged from clinic by wheel chair in stable condition. Alert and oriented x 3. F/U with [facility] as scheduled.   ?

## 2021-09-05 ENCOUNTER — Other Ambulatory Visit: Payer: Self-pay | Admitting: Urology

## 2021-09-06 ENCOUNTER — Inpatient Hospital Stay (HOSPITAL_COMMUNITY): Payer: Medicare Other

## 2021-09-06 VITALS — BP 115/64 | HR 79 | Temp 97.6°F | Resp 18

## 2021-09-06 DIAGNOSIS — Z87891 Personal history of nicotine dependence: Secondary | ICD-10-CM | POA: Diagnosis not present

## 2021-09-06 DIAGNOSIS — D509 Iron deficiency anemia, unspecified: Secondary | ICD-10-CM | POA: Diagnosis not present

## 2021-09-06 DIAGNOSIS — C9000 Multiple myeloma not having achieved remission: Secondary | ICD-10-CM | POA: Diagnosis not present

## 2021-09-06 DIAGNOSIS — K219 Gastro-esophageal reflux disease without esophagitis: Secondary | ICD-10-CM | POA: Diagnosis not present

## 2021-09-06 DIAGNOSIS — E785 Hyperlipidemia, unspecified: Secondary | ICD-10-CM | POA: Diagnosis not present

## 2021-09-06 DIAGNOSIS — Z79899 Other long term (current) drug therapy: Secondary | ICD-10-CM | POA: Diagnosis not present

## 2021-09-06 DIAGNOSIS — D508 Other iron deficiency anemias: Secondary | ICD-10-CM

## 2021-09-06 MED ORDER — SODIUM CHLORIDE 0.9 % IV SOLN: Freq: Once | INTRAVENOUS | Status: AC

## 2021-09-06 MED ORDER — ACETAMINOPHEN 325 MG PO TABS
650.0000 mg | ORAL_TABLET | Freq: Once | ORAL | Status: AC
  Administered 2021-09-06: 650 mg via ORAL
  Filled 2021-09-06: qty 2

## 2021-09-06 MED ORDER — LORATADINE 10 MG PO TABS
10.0000 mg | ORAL_TABLET | Freq: Once | ORAL | Status: AC
  Administered 2021-09-06: 10 mg via ORAL
  Filled 2021-09-06: qty 1

## 2021-09-06 MED ORDER — SODIUM CHLORIDE 0.9 % IV SOLN
510.0000 mg | Freq: Once | INTRAVENOUS | Status: AC
  Administered 2021-09-06: 510 mg via INTRAVENOUS
  Filled 2021-09-06: qty 510

## 2021-09-06 NOTE — Patient Instructions (Signed)
[facility]  Discharge Instructions: °Thank you for choosing [facility] to provide your oncology and hematology care.  °If you have a lab appointment with the Cancer Center, please come in thru the Main Entrance and check in at the main information desk. ° °Wear comfortable clothing and clothing appropriate for easy access to any Portacath or PICC line.  ° °We strive to give you quality time with your provider. You may need to reschedule your appointment if you arrive late (15 or more minutes).  Arriving late affects you and other patients whose appointments are after yours.  Also, if you miss three or more appointments without notifying the office, you may be dismissed from the clinic at the provider’s discretion.    °  °For prescription refill requests, have your pharmacy contact our office and allow 72 hours for refills to be completed.   ° °Today you received the following chemotherapy and/or immunotherapy agents Feraheme    °  °To help prevent nausea and vomiting after your treatment, we encourage you to take your nausea medication as directed. ° °BELOW ARE SYMPTOMS THAT SHOULD BE REPORTED IMMEDIATELY: °*FEVER GREATER THAN 100.4 F (38 °C) OR HIGHER °*CHILLS OR SWEATING °*NAUSEA AND VOMITING THAT IS NOT CONTROLLED WITH YOUR NAUSEA MEDICATION °*UNUSUAL SHORTNESS OF BREATH °*UNUSUAL BRUISING OR BLEEDING °*URINARY PROBLEMS (pain or burning when urinating, or frequent urination) °*BOWEL PROBLEMS (unusual diarrhea, constipation, pain near the anus) °TENDERNESS IN MOUTH AND THROAT WITH OR WITHOUT PRESENCE OF ULCERS (sore throat, sores in mouth, or a toothache) °UNUSUAL RASH, SWELLING OR PAIN  °UNUSUAL VAGINAL DISCHARGE OR ITCHING  ° °Items with * indicate a potential emergency and should be followed up as soon as possible or go to the Emergency Department if any problems should occur. ° °Please show the CHEMOTHERAPY ALERT CARD or IMMUNOTHERAPY ALERT CARD at check-in to the Emergency  Department and triage nurse. ° °Should you have questions after your visit or need to cancel or reschedule your appointment, please contact [facility] [phone]  and follow the prompts.  Office hours are 8:00 a.m. to 4:30 p.m. Monday - Friday. Please note that voicemails left after 4:00 p.m. may not be returned until the following business day.  We are closed weekends and major holidays. You have access to a nurse at all times for urgent questions. Please call the main number to the clinic [phone] and follow the prompts. ° °For any non-urgent questions, you may also contact your provider using MyChart. We now offer e-Visits for anyone 18 and older to request care online for non-urgent symptoms. For details visit [email or web address]. °  °Also download the MyChart app! Go to the app store, search "MyChart", open the app, select [facility], and log in with your MyChart username and password. ° °Due to Covid, a mask is required upon entering the hospital/clinic. If you do not have a mask, one will be given to you upon arrival. For doctor visits, patients may have 1 support person aged 18 or older with them. For treatment visits, patients cannot have anyone with them due to current Covid guidelines and our immunocompromised population.  °

## 2021-09-06 NOTE — Progress Notes (Signed)
Patient presents today for Feraheme infusion per providers order.  Vital signs WNL.  Patient has no new complaints at this time.    Peripheral IV started and blood return noted pre and post infusion.  Feraheme infusion  given today per MD orders.  Stable during infusion without adverse affects.  Vital signs stable.  No complaints at this time.  Discharge from clinic ambulatory in stable condition.  Alert and oriented X 3.  Follow up with [facility] as scheduled.  

## 2021-09-17 DIAGNOSIS — R0902 Hypoxemia: Secondary | ICD-10-CM | POA: Diagnosis not present

## 2021-09-18 ENCOUNTER — Encounter (HOSPITAL_COMMUNITY): Payer: Self-pay

## 2021-09-18 NOTE — Progress Notes (Signed)
Patient called requesting medical records. Attempted to call patient back for further discussion on how to contact the medical records department, unable to reach the patient and VM box full. ?

## 2021-09-25 ENCOUNTER — Other Ambulatory Visit: Payer: Self-pay | Admitting: Orthopedic Surgery

## 2021-09-25 DIAGNOSIS — M25512 Pain in left shoulder: Secondary | ICD-10-CM

## 2021-09-30 ENCOUNTER — Other Ambulatory Visit: Payer: Self-pay | Admitting: Family Medicine

## 2021-10-02 NOTE — Telephone Encounter (Signed)
Please fill out posting sheet for surgery ?

## 2021-10-10 ENCOUNTER — Other Ambulatory Visit: Payer: Self-pay | Admitting: Urology

## 2021-10-10 DIAGNOSIS — N132 Hydronephrosis with renal and ureteral calculous obstruction: Secondary | ICD-10-CM

## 2021-10-13 ENCOUNTER — Other Ambulatory Visit: Payer: Self-pay | Admitting: Family Medicine

## 2021-10-16 ENCOUNTER — Other Ambulatory Visit: Payer: Self-pay | Admitting: Urology

## 2021-10-17 DIAGNOSIS — R0902 Hypoxemia: Secondary | ICD-10-CM | POA: Diagnosis not present

## 2021-10-18 ENCOUNTER — Ambulatory Visit (INDEPENDENT_AMBULATORY_CARE_PROVIDER_SITE_OTHER): Payer: Medicare Other | Admitting: Family Medicine

## 2021-10-18 ENCOUNTER — Encounter: Payer: Self-pay | Admitting: Family Medicine

## 2021-10-18 VITALS — BP 115/74 | HR 95 | Resp 16 | Ht 66.5 in | Wt 189.0 lb

## 2021-10-18 DIAGNOSIS — F419 Anxiety disorder, unspecified: Secondary | ICD-10-CM | POA: Diagnosis not present

## 2021-10-18 DIAGNOSIS — F32A Depression, unspecified: Secondary | ICD-10-CM

## 2021-10-18 DIAGNOSIS — E785 Hyperlipidemia, unspecified: Secondary | ICD-10-CM

## 2021-10-18 DIAGNOSIS — E1121 Type 2 diabetes mellitus with diabetic nephropathy: Secondary | ICD-10-CM | POA: Diagnosis not present

## 2021-10-18 DIAGNOSIS — I10 Essential (primary) hypertension: Secondary | ICD-10-CM | POA: Diagnosis not present

## 2021-10-18 DIAGNOSIS — E663 Overweight: Secondary | ICD-10-CM

## 2021-10-18 NOTE — Patient Instructions (Addendum)
F/U in 5 months, call if you need me ebfore ? ?HBA1C, chem 7 and EGFR today ? ?No retained tick or infection on left ankle ? ?Your weight is stable and unchanged since December 2022 ? ? ?Leg swelling is mild, and since it goes down overnight this is not a major preblom ? ?It is important that you exercise regularly at least 30 minutes 5 times a week. If you develop chest pain, have severe difficulty breathing, or feel very tired, stop exercising immediately and seek medical attention  ? ?Thanks for choosing [facility], we consider it a privelige to serve you. ? ?

## 2021-10-19 LAB — BMP8+EGFR
BUN/Creatinine Ratio: 11 — ABNORMAL LOW (ref 12–28)
BUN: 21 mg/dL (ref 8–27)
CO2: 21 mmol/L (ref 20–29)
Calcium: 10.2 mg/dL (ref 8.7–10.3)
Chloride: 100 mmol/L (ref 96–106)
Creatinine, Ser: 1.89 mg/dL — ABNORMAL HIGH (ref 0.57–1.00)
Glucose: 92 mg/dL (ref 70–99)
Potassium: 3.8 mmol/L (ref 3.5–5.2)
Sodium: 139 mmol/L (ref 134–144)
eGFR: 27 mL/min/{1.73_m2} — ABNORMAL LOW (ref >59)

## 2021-10-19 LAB — HEMOGLOBIN A1C
Est. average glucose Bld gHb Est-mCnc: 128 mg/dL
Hgb A1c MFr Bld: 6.1 % — ABNORMAL HIGH (ref 4.8–5.6)

## 2021-10-20 ENCOUNTER — Encounter: Payer: Self-pay | Admitting: Family Medicine

## 2021-10-20 IMAGING — MG DIGITAL SCREENING BILAT W/ TOMO W/ CAD
6 of 10 series · 6 of 30 positions shown · non-contrast
Comparison: Previous exam(s).

ACR Breast Density Category a: The breast tissue is almost entirely
fatty.

CLINICAL DATA: Screening.

EXAM:
DIGITAL SCREENING BILATERAL MAMMOGRAM WITH TOMO AND CAD

[R XCCL synth-2D]
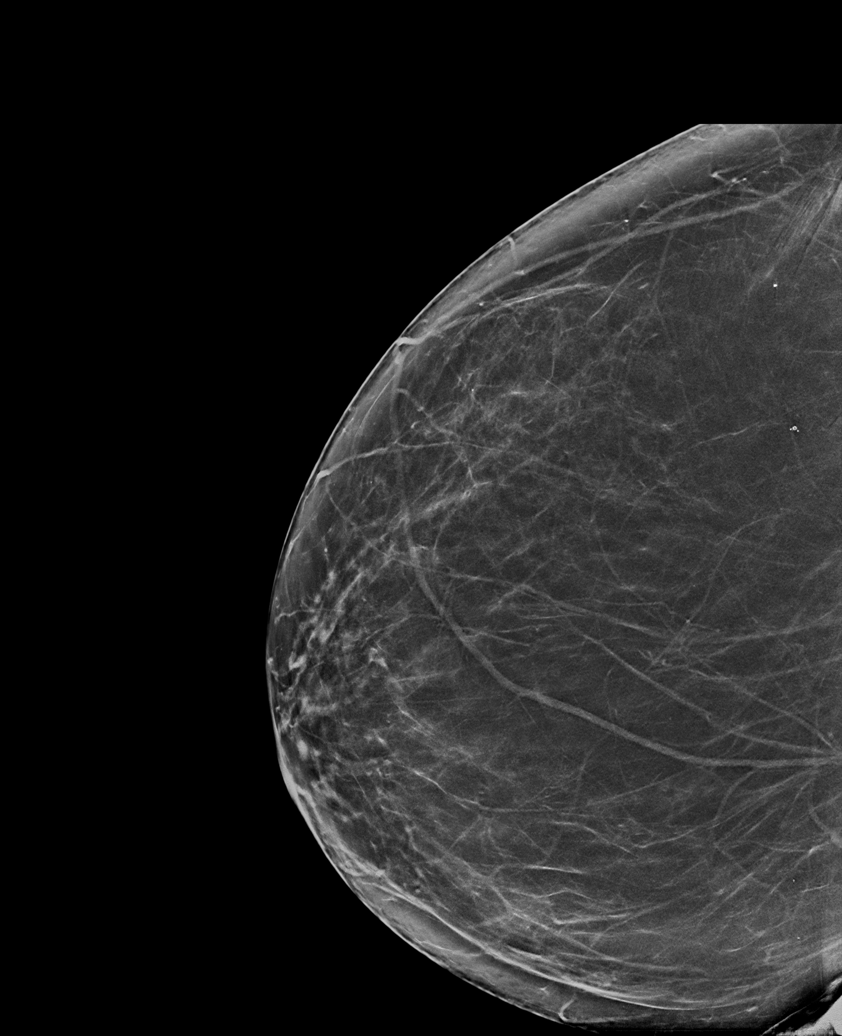

[L CC synth-2D]
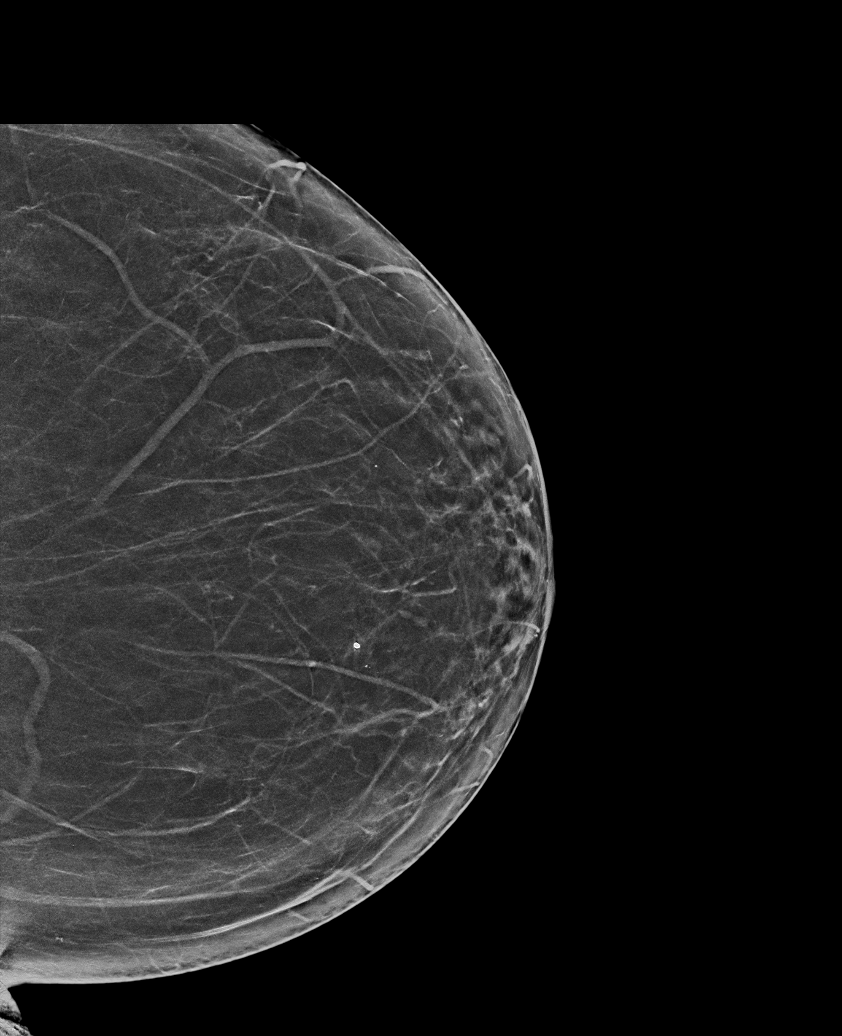

[R CC synth-2D]
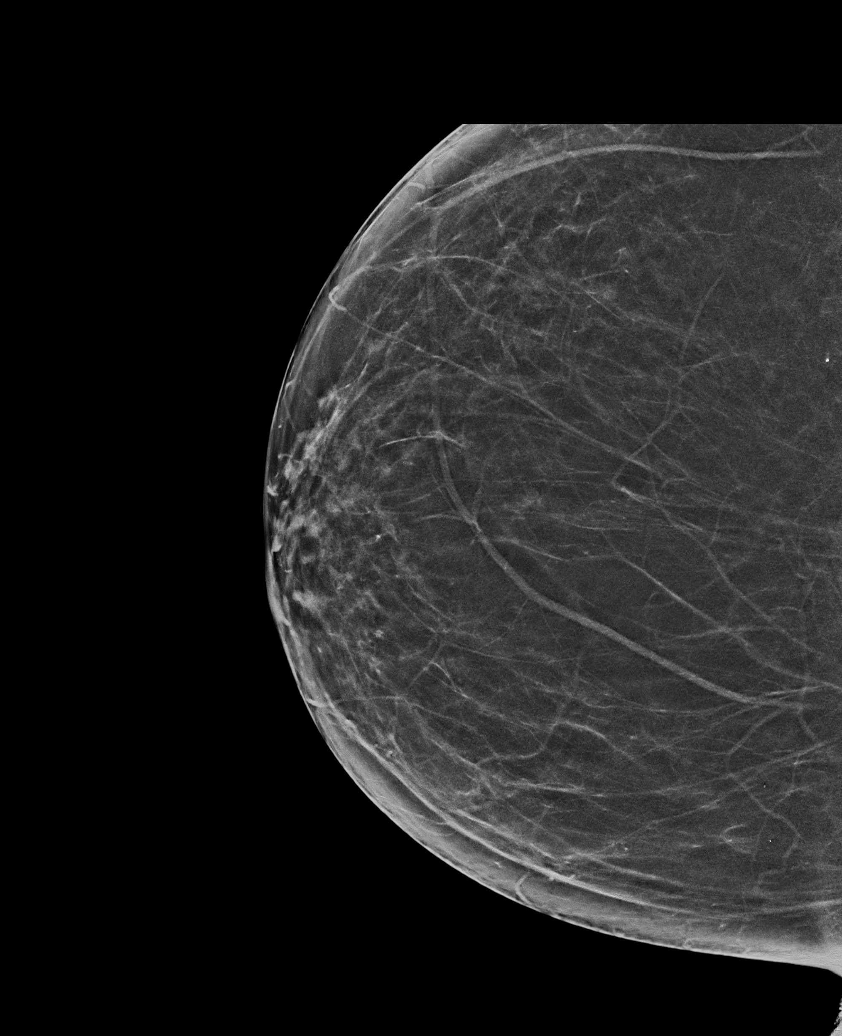

[R MLO synth-2D]
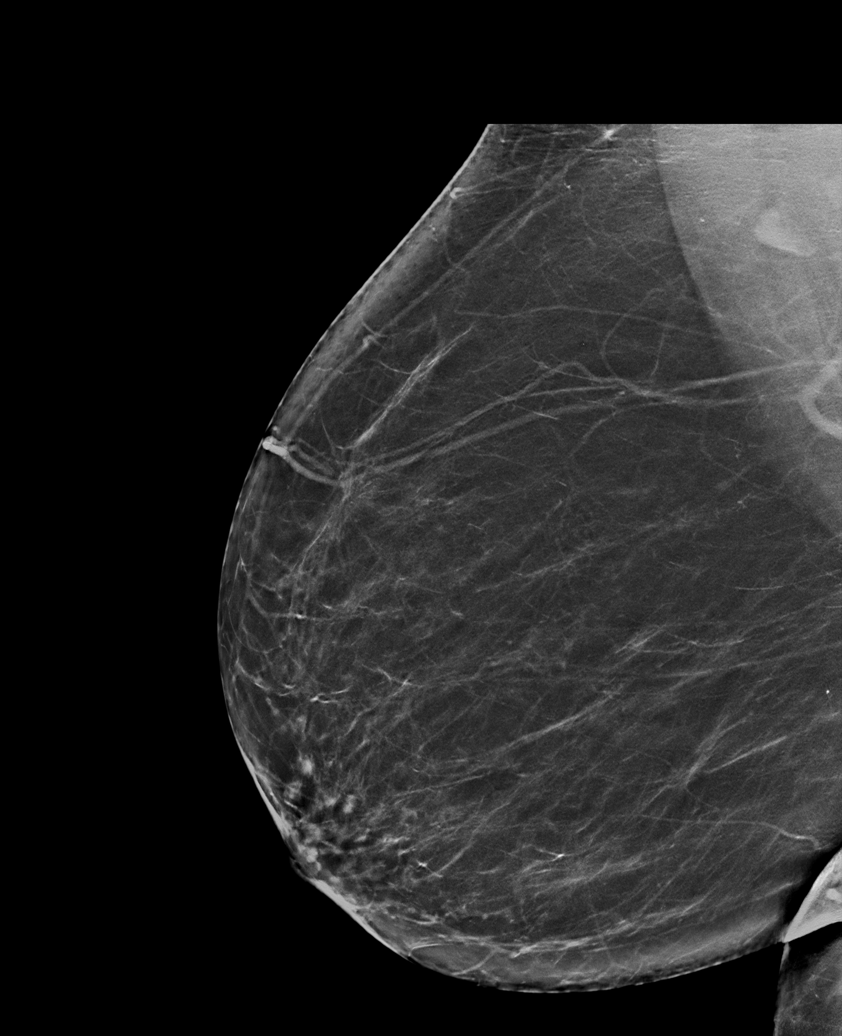

[L MLO synth-2D]
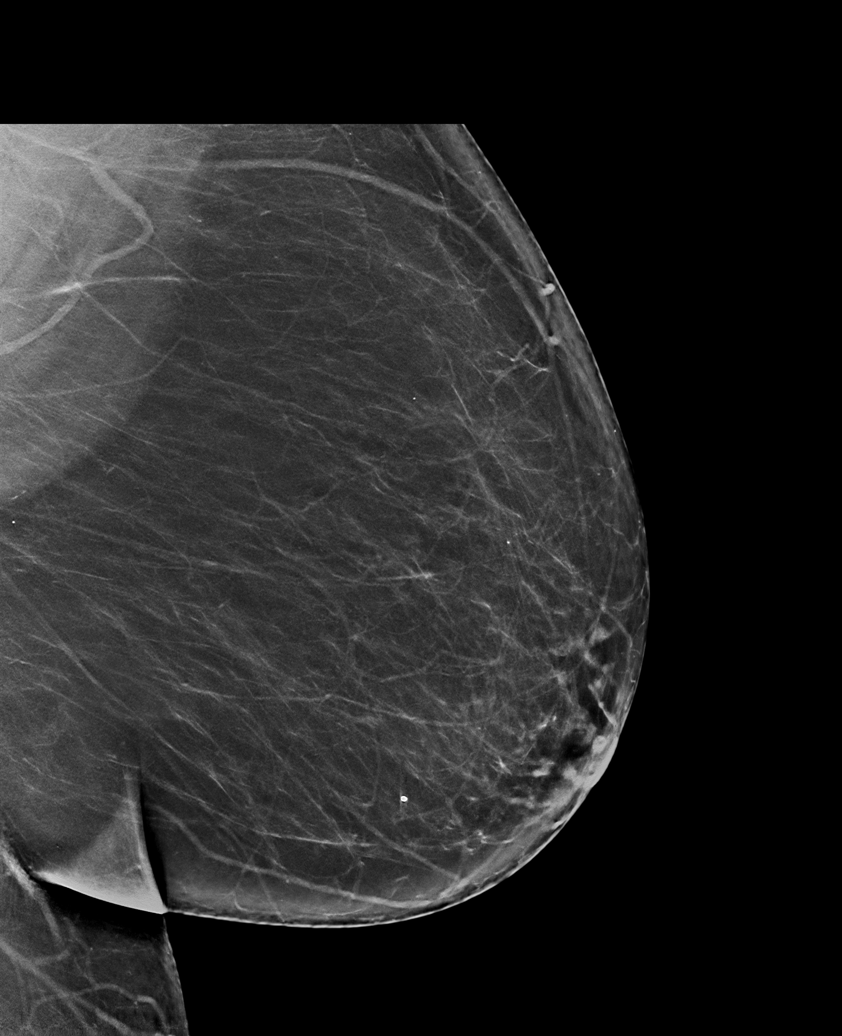

[R XCCL tomo · tomo slice 39/77.0]
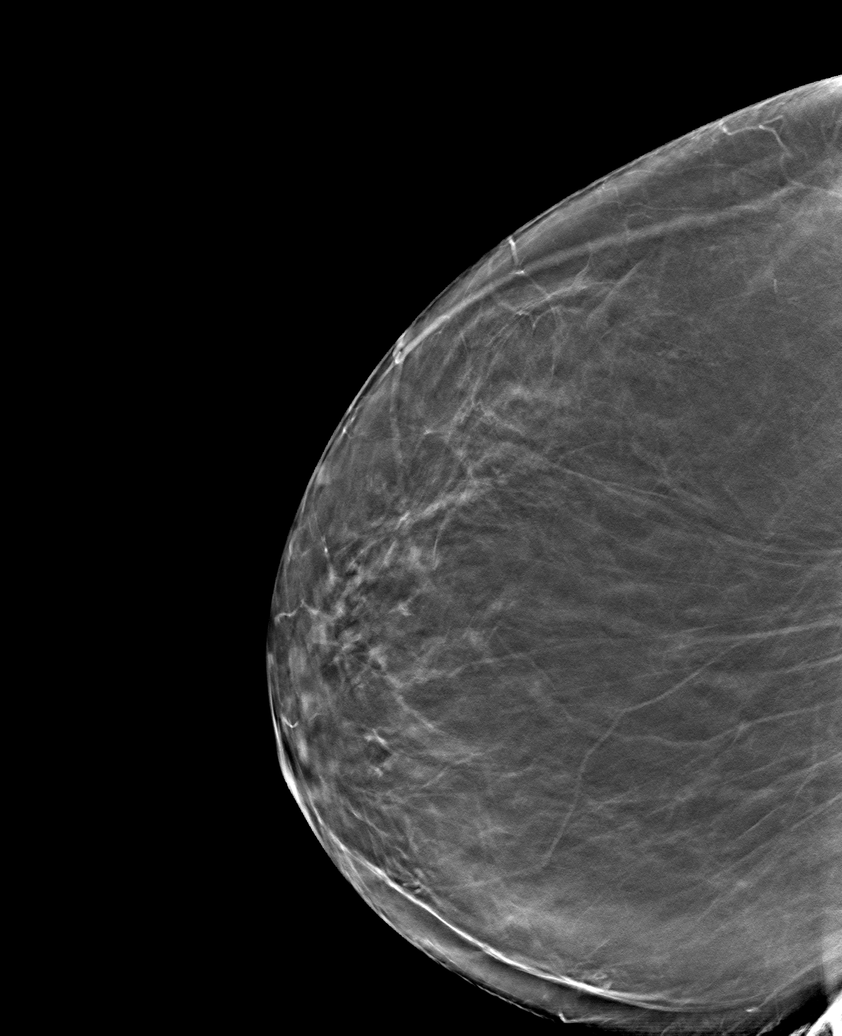

[6 of 30 positions shown; findings below may reference images not displayed]

FINDINGS: There are no findings suspicious for malignancy. Images were
processed with CAD.
IMPRESSION: No mammographic evidence of malignancy. A result letter of this
screening mammogram will be mailed directly to the patient.

RECOMMENDATION:
Screening mammogram in one year. (Code:8Y-[id number])

BI-RADS CATEGORY  1: Negative.

## 2021-10-22 ENCOUNTER — Encounter: Payer: Self-pay | Admitting: Family Medicine

## 2021-10-22 NOTE — Assessment & Plan Note (Signed)
Controlled, no change in medication ?DASH diet and commitment to daily physical activity for a minimum of 30 minutes discussed and encouraged, as a part of hypertension management. ?The importance of attaining a healthy weight is also discussed. ? ? ?  10/18/2021  ?  1:40 PM 09/06/2021  ?  2:49 PM 09/06/2021  ?  1:43 PM 08/30/2021  ?  3:17 PM 08/30/2021  ?  1:51 PM 08/23/2021  ? 10:36 AM 07/28/2021  ? 11:06 AM  ?BP/Weight  ?Systolic BP 945 859 292 446 124 125 128  ?Diastolic BP 74 64 77 78 80 75 70  ?Wt. (Lbs) 189     190.92 187  ?BMI 30.05 kg/m2     29.9 kg/m2 29.29 kg/m2  ? ? ? ? ?

## 2021-10-22 NOTE — Progress Notes (Signed)
? ?Cheryl Abbott     MRN: [id number]      DOB: Oct 10, 1945 ? ? ?HPI ?Cheryl Abbott is here for follow up and re-evaluation of chronic medical conditions, medication management and review of any available recent lab and radiology data.  ?Preventive health is updated, specifically  Cancer screening and Immunization.   ?Questions or concerns regarding consultations or procedures which the PT has had in the interim are  addressed. ?The PT denies any adverse reactions to current medications since the last visit.  ?Complaints as in nurse note and each is addressed ? ?ROS ?Denies recent fever or chills. ?Denies sinus pressure, nasal congestion, ear pain or sore throat. ?Denies chest congestion, productive cough or wheezing. ?Denies chest pains, palpitations and c/o leg swelling ?Denies abdominal pain, nausea, vomiting,diarrhea or constipation.   ?Denies dysuria, frequency, hesitancy or incontinence. ?Denies joint pain, swelling and limitation in mobility. ?Denies headaches, seizures, numbness, or tingling. ?Denies uncontrolled depression, anxiety or insomnia. ?Denies skin break down or rash.Recent tick bite ? ? ?PE ? ?BP 115/74   Pulse 95   Resp 16   Ht 5' 6.5" (1.689 m)   Wt 189 lb (85.7 kg)   SpO2 (!) 89%   BMI 30.05 kg/m?  ? ?Patient alert and oriented and in no cardiopulmonary distress. ? ?HEENT: No facial asymmetry, EOMI,     Neck supple . ? ?Chest: Clear to auscultation bilaterally. ? ?CVS: S1, S2 no murmurs, no S3.Regular rate. ? ?ABD: Soft non tender.  ? ?Ext: No edema ? ?MS: Adequate ROM spine, shoulders, hips and knees. ? ?Skin: Intact, no ulcerations or rash noted.Hyperpigmented lesion in area of tick bite ? ?Psych: Good eye contact, normal affect. Memory intact not anxious or depressed appearing. ? ?CNS: CN 2-12 intact, power,  normal throughout.no focal deficits noted. ? ? ?Assessment & Plan ? ?Hypertension goal BP (blood pressure) < 130/80 ?Controlled, no change in medication ?DASH diet and commitment to  daily physical activity for a minimum of 30 minutes discussed and encouraged, as a part of hypertension management. ?The importance of attaining a healthy weight is also discussed. ? ? ?  10/18/2021  ?  1:40 PM 09/06/2021  ?  2:49 PM 09/06/2021  ?  1:43 PM 08/30/2021  ?  3:17 PM 08/30/2021  ?  1:51 PM 08/23/2021  ? 10:36 AM 07/28/2021  ? 11:06 AM  ?BP/Weight  ?Systolic BP 330 076 226 333 124 125 128  ?Diastolic BP 74 64 77 78 80 75 70  ?Wt. (Lbs) 189     190.92 187  ?BMI 30.05 kg/m2     29.9 kg/m2 29.29 kg/m2  ? ? ? ? ? ?Type 2 diabetes with nephropathy ([place]) ?Controlled, no change in medication ?[name] is reminded of the importance of commitment to daily physical activity for 30 minutes or more, as able and the need to limit carbohydrate intake to 30 to 60 grams per meal to help with blood sugar control.  ? ?The need to take medication as prescribed, test blood sugar as directed, and to call between visits if there is a concern that blood sugar is uncontrolled is also discussed.  ? ?[name] is reminded of the importance of daily foot exam, annual eye examination, and good blood sugar, blood pressure and cholesterol control. ? ? ?  Latest Ref Rng & Units 10/18/2021  ?  2:42 PM 08/16/2021  ? 11:54 AM 08/11/2021  ?  9:16 AM 07/11/2021  ?  9:51 AM 07/06/2021  ?  3:12 PM  ?Diabetic Labs  ?HbA1c 4.8 - 5.6 % 6.1      5.9    ?Chol 0 - 200 mg/dL    129     ?HDL >40 mg/dL    41     ?Calc LDL 0 - 99 mg/dL    58     ?Triglycerides <150 mg/dL    152     ?Creatinine 0.57 - 1.00 mg/dL 1.89   1.70   1.70      ? ? ?  10/18/2021  ?  1:40 PM 09/06/2021  ?  2:49 PM 09/06/2021  ?  1:43 PM 08/30/2021  ?  3:17 PM 08/30/2021  ?  1:51 PM 08/23/2021  ? 10:36 AM 07/28/2021  ? 11:06 AM  ?BP/Weight  ?Systolic BP 423 536 144 315 124 125 128  ?Diastolic BP 74 64 77 78 80 75 70  ?Wt. (Lbs) 189     190.92 187  ?BMI 30.05 kg/m2     29.9 kg/m2 29.29 kg/m2  ? ? ?  Latest Ref Rng & Units 08/15/2021  ? 12:00 AM 03/08/2021  ? 12:00 AM  ?Foot/eye exam completion dates   ?Eye Exam No Retinopathy No Retinopathy      No Retinopathy       ?  ? This result is from an external source.  ? ? ? ? ? ? ?Hyperlipidemia LDL goal <100 ?Hyperlipidemia:Low fat diet discussed and encouraged. ? ? ?Lipid Panel  ?Lab Results  ?Component Value Date  ? CHOL 129 07/11/2021  ? HDL 41 07/11/2021  ? [place] 58 07/11/2021  ? TRIG 152 (H) 07/11/2021  ? CHOLHDL 3.1 07/11/2021  ? ? ? ?Needs to reduce fat in diet, no med change ? ?Anxiety and depression ?Controlled, no change in medication ? ? ?Overweight ? ?Patient re-educated about  the importance of commitment to a  minimum of 150 minutes of exercise per week as able. ? ?The importance of healthy food choices with portion control discussed, as well as eating regularly and within a 12 hour window most days. ?The need to choose "clean , green" food 50 to 75% of the time is discussed, as well as to make water the primary drink and set a goal of 64 ounces water daily. ? ?  ? ?  10/18/2021  ?  1:40 PM 08/23/2021  ? 10:36 AM 07/28/2021  ? 11:06 AM  ?Weight /BMI  ?Weight 189 lb 190 lb 14.7 oz 187 lb  ?Height 5' 6.5" (1.689 m)  '5\' 7"'$  (1.702 m)  ?BMI 30.05 kg/m2 29.9 kg/m2 29.29 kg/m2  ? ? ? ? ?

## 2021-10-22 NOTE — Assessment & Plan Note (Signed)
Hyperlipidemia:Low fat diet discussed and encouraged. ? ? ?Lipid Panel  ?Lab Results  ?Component Value Date  ? CHOL 129 07/11/2021  ? HDL 41 07/11/2021  ? [place] 58 07/11/2021  ? TRIG 152 (H) 07/11/2021  ? CHOLHDL 3.1 07/11/2021  ? ? ? ?Needs to reduce fat in diet, no med change ?

## 2021-10-22 NOTE — Assessment & Plan Note (Signed)
Controlled, no change in medication  

## 2021-10-22 NOTE — Assessment & Plan Note (Signed)
Controlled, no change in medication ?Cheryl Abbott is reminded of the importance of commitment to daily physical activity for 30 minutes or more, as able and the need to limit carbohydrate intake to 30 to 60 grams per meal to help with blood sugar control.  ? ?The need to take medication as prescribed, test blood sugar as directed, and to call between visits if there is a concern that blood sugar is uncontrolled is also discussed.  ? ?Cheryl Abbott is reminded of the importance of daily foot exam, annual eye examination, and good blood sugar, blood pressure and cholesterol control. ? ? ?  Latest Ref Rng & Units 10/18/2021  ?  2:42 PM 08/16/2021  ? 11:54 AM 08/11/2021  ?  9:16 AM 07/11/2021  ?  9:51 AM 07/06/2021  ?  3:12 PM  ?Diabetic Labs  ?HbA1c 4.8 - 5.6 % 6.1      5.9    ?Chol 0 - 200 mg/dL    129     ?HDL >40 mg/dL    41     ?Calc LDL 0 - 99 mg/dL    58     ?Triglycerides <150 mg/dL    152     ?Creatinine 0.57 - 1.00 mg/dL 1.89   1.70   1.70      ? ? ?  10/18/2021  ?  1:40 PM 09/06/2021  ?  2:49 PM 09/06/2021  ?  1:43 PM 08/30/2021  ?  3:17 PM 08/30/2021  ?  1:51 PM 08/23/2021  ? 10:36 AM 07/28/2021  ? 11:06 AM  ?BP/Weight  ?Systolic BP 767 341 937 902 124 125 128  ?Diastolic BP 74 64 77 78 80 75 70  ?Wt. (Lbs) 189     190.92 187  ?BMI 30.05 kg/m2     29.9 kg/m2 29.29 kg/m2  ? ? ?  Latest Ref Rng & Units 08/15/2021  ? 12:00 AM 03/08/2021  ? 12:00 AM  ?Foot/eye exam completion dates  ?Eye Exam No Retinopathy No Retinopathy      No Retinopathy       ?  ? This result is from an external source.  ? ? ? ? ? ?

## 2021-10-22 NOTE — Assessment & Plan Note (Signed)
?  Patient re-educated about  the importance of commitment to a  minimum of 150 minutes of exercise per week as able. ? ?The importance of healthy food choices with portion control discussed, as well as eating regularly and within a 12 hour window most days. ?The need to choose "clean , green" food 50 to 75% of the time is discussed, as well as to make water the primary drink and set a goal of 64 ounces water daily. ? ?  ? ?  10/18/2021  ?  1:40 PM 08/23/2021  ? 10:36 AM 07/28/2021  ? 11:06 AM  ?Weight /BMI  ?Weight 189 lb 190 lb 14.7 oz 187 lb  ?Height 5' 6.5" (1.689 m)  '5\' 7"'$  (1.702 m)  ?BMI 30.05 kg/m2 29.9 kg/m2 29.29 kg/m2  ? ? ? ?

## 2021-10-23 ENCOUNTER — Encounter (HOSPITAL_BASED_OUTPATIENT_CLINIC_OR_DEPARTMENT_OTHER): Payer: Self-pay | Admitting: Urology

## 2021-10-23 ENCOUNTER — Other Ambulatory Visit: Payer: Self-pay

## 2021-10-23 ENCOUNTER — Telehealth: Payer: Self-pay | Admitting: Family Medicine

## 2021-10-23 NOTE — Telephone Encounter (Signed)
I have addended the oV from 02/17/2021, where there is afoot exam documented. ?Please send that note to [place], I have left the cover sheet, the hope is that diocumentation is now sufficient for her to qualify for diabetic shoes. ?Thanks ?

## 2021-10-23 NOTE — Progress Notes (Signed)
Spoke w/ via phone for pre-op interview--- pt ?Lab needs dos----  istat             ?Lab results------ current ekg in epic/ chart ?COVID test -----patient states asymptomatic no test needed ?Arrive at ------- 1245 on 10-26-2021 ?NPO after MN NO Solid Food.  Clear liquids from MN until--- 1145 ?Med rec completed ?Medications to take morning of surgery ----- crestor, protonix, norvasc, paxil, symbicort inhaler ?Diabetic medication ----- do not take tradjenta and no trulicity insulin morning of surgery ?Patient instructed no nail polish to be worn day of surgery ?Patient instructed to bring photo id and insurance card day of surgery ?Patient aware to have Driver (ride ) / caregiver  for 24 hours after surgery ---friend, Herbie [place] doe ?Patient Special Instructions ----- n/a ?Pre-Op special Istructions ----- n/a ?Patient verbalized understanding of instructions that were given at this phone interview. ?Patient denies shortness of breath, chest pain, fever, cough at this phone interview.  ? 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 ?Per pt DOE w/ any activity without dx copd/ asthma. ? ?PCP:  Dr Jerilynn Mages. Moshe Cipro (lov 10-18-2021 epic) ?Cardiologist : Dr Zandra Abts (lov 07-19-2021 epic ?Oncologist:  Dr Delton Coombes Cassell Clement 10-23-2021) ?Chest x-ray : 06-07-2021 ?EKG : 06-07-2021 ?Echo : 03-18-2018 ?Stress test: 01-01-2014 ?Cardiac Cath : 2004 ?Activity level: per pt DOE w/ any activity ?Sleep Study/ CPAP :  Yes/ No ?Fasting Blood Sugar :      / Checks Blood Sugar -- times a day: normally checks daily in am but has not in past week or so out of lancet's ?Blood Thinner/ Instructions /Last Dose: no ?ASA / Instructions/ Last Dose :  no ?

## 2021-10-23 NOTE — Telephone Encounter (Signed)
Note printed and faxed.

## 2021-10-24 ENCOUNTER — Other Ambulatory Visit: Payer: Self-pay | Admitting: Family Medicine

## 2021-10-24 ENCOUNTER — Other Ambulatory Visit: Payer: Self-pay

## 2021-10-24 ENCOUNTER — Other Ambulatory Visit: Payer: Self-pay | Admitting: Nurse Practitioner

## 2021-10-24 ENCOUNTER — Other Ambulatory Visit: Payer: Self-pay | Admitting: Orthopedic Surgery

## 2021-10-24 DIAGNOSIS — L600 Ingrowing nail: Secondary | ICD-10-CM

## 2021-10-24 DIAGNOSIS — M25512 Pain in left shoulder: Secondary | ICD-10-CM

## 2021-10-24 MED ORDER — PANTOPRAZOLE SODIUM 40 MG PO TBEC: 40.0000 mg | DELAYED_RELEASE_TABLET | Freq: Two times a day (BID) | ORAL | 1 refills | Status: DC

## 2021-10-26 ENCOUNTER — Encounter (HOSPITAL_BASED_OUTPATIENT_CLINIC_OR_DEPARTMENT_OTHER): Payer: Self-pay | Admitting: Urology

## 2021-10-26 ENCOUNTER — Ambulatory Visit (HOSPITAL_BASED_OUTPATIENT_CLINIC_OR_DEPARTMENT_OTHER)
Admission: RE | Admit: 2021-10-26 | Discharge: 2021-10-26 | Disposition: A | Discharge status: Home / Self Care | Payer: Medicare Other | Attending: Urology | Admitting: Urology

## 2021-10-26 ENCOUNTER — Encounter (HOSPITAL_BASED_OUTPATIENT_CLINIC_OR_DEPARTMENT_OTHER)
Admission: RE | Disposition: A | Discharge status: Home / Self Care | Payer: Self-pay | Source: Home / Self Care | Attending: Urology

## 2021-10-26 ENCOUNTER — Ambulatory Visit (HOSPITAL_BASED_OUTPATIENT_CLINIC_OR_DEPARTMENT_OTHER): Payer: Medicare Other | Admitting: Anesthesiology

## 2021-10-26 DIAGNOSIS — N131 Hydronephrosis with ureteral stricture, not elsewhere classified: Secondary | ICD-10-CM | POA: Diagnosis not present

## 2021-10-26 DIAGNOSIS — E1122 Type 2 diabetes mellitus with diabetic chronic kidney disease: Secondary | ICD-10-CM | POA: Diagnosis not present

## 2021-10-26 DIAGNOSIS — G4730 Sleep apnea, unspecified: Secondary | ICD-10-CM | POA: Diagnosis not present

## 2021-10-26 DIAGNOSIS — N183 Chronic kidney disease, stage 3 unspecified: Secondary | ICD-10-CM | POA: Insufficient documentation

## 2021-10-26 DIAGNOSIS — E119 Type 2 diabetes mellitus without complications: Secondary | ICD-10-CM

## 2021-10-26 DIAGNOSIS — N132 Hydronephrosis with renal and ureteral calculous obstruction: Secondary | ICD-10-CM | POA: Insufficient documentation

## 2021-10-26 DIAGNOSIS — M1990 Unspecified osteoarthritis, unspecified site: Secondary | ICD-10-CM | POA: Insufficient documentation

## 2021-10-26 DIAGNOSIS — G4733 Obstructive sleep apnea (adult) (pediatric): Secondary | ICD-10-CM | POA: Diagnosis not present

## 2021-10-26 DIAGNOSIS — F418 Other specified anxiety disorders: Secondary | ICD-10-CM | POA: Diagnosis not present

## 2021-10-26 DIAGNOSIS — N201 Calculus of ureter: Secondary | ICD-10-CM | POA: Diagnosis not present

## 2021-10-26 DIAGNOSIS — K219 Gastro-esophageal reflux disease without esophagitis: Secondary | ICD-10-CM | POA: Insufficient documentation

## 2021-10-26 DIAGNOSIS — Z7984 Long term (current) use of oral hypoglycemic drugs: Secondary | ICD-10-CM | POA: Diagnosis not present

## 2021-10-26 DIAGNOSIS — Z87891 Personal history of nicotine dependence: Secondary | ICD-10-CM | POA: Insufficient documentation

## 2021-10-26 DIAGNOSIS — Z9989 Dependence on other enabling machines and devices: Secondary | ICD-10-CM | POA: Diagnosis not present

## 2021-10-26 DIAGNOSIS — I129 Hypertensive chronic kidney disease with stage 1 through stage 4 chronic kidney disease, or unspecified chronic kidney disease: Secondary | ICD-10-CM | POA: Insufficient documentation

## 2021-10-26 DIAGNOSIS — Z01818 Encounter for other preprocedural examination: Secondary | ICD-10-CM

## 2021-10-26 DIAGNOSIS — I10 Essential (primary) hypertension: Secondary | ICD-10-CM | POA: Diagnosis not present

## 2021-10-26 HISTORY — DX: Localized edema: R60.0

## 2021-10-26 HISTORY — PX: CYSTOSCOPY WITH RETROGRADE PYELOGRAM, URETEROSCOPY AND STENT PLACEMENT: SHX5789

## 2021-10-26 HISTORY — DX: Nocturia: R35.1

## 2021-10-26 HISTORY — DX: Cyst of kidney, acquired: N28.1

## 2021-10-26 HISTORY — DX: Anemia, unspecified: D64.9

## 2021-10-26 HISTORY — DX: Unspecified hydronephrosis: N13.30

## 2021-10-26 HISTORY — DX: Unspecified hemorrhoids: K64.9

## 2021-10-26 HISTORY — DX: Iron deficiency: E61.1

## 2021-10-26 HISTORY — DX: Other chronic pain: G89.29

## 2021-10-26 HISTORY — DX: Claustrophobia: F40.240

## 2021-10-26 HISTORY — DX: Calculus of ureter: N20.1

## 2021-10-26 HISTORY — DX: Chronic kidney disease, stage 3 unspecified: N18.30

## 2021-10-26 HISTORY — DX: Atrophy of kidney (terminal): N26.1

## 2021-10-26 LAB — POCT I-STAT, CHEM 8
BUN: 22 mg/dL (ref 8–23)
Calcium, Ion: 1.3 mmol/L (ref 1.15–1.40)
Chloride: 105 mmol/L (ref 98–111)
Creatinine, Ser: 2 mg/dL — ABNORMAL HIGH (ref 0.44–1.00)
Glucose, Bld: 101 mg/dL — ABNORMAL HIGH (ref 70–99)
HCT: 44 % (ref 36.0–46.0)
Hemoglobin: 15 g/dL (ref 12.0–15.0)
Potassium: 4.9 mmol/L (ref 3.5–5.1)
Sodium: 142 mmol/L (ref 135–145)
TCO2: 26 mmol/L (ref 22–32)

## 2021-10-26 LAB — GLUCOSE, CAPILLARY
Glucose-Capillary: 82 mg/dL (ref 70–99)
Glucose-Capillary: 106 mg/dL — ABNORMAL HIGH (ref 70–99)

## 2021-10-26 SURGERY — CYSTOURETEROSCOPY, WITH RETROGRADE PYELOGRAM AND STENT INSERTION
Anesthesia: General | Site: Pelvis | Laterality: Left

## 2021-10-26 MED ORDER — SODIUM CHLORIDE 0.9 % IV SOLN
INTRAVENOUS | Status: DC
  Administered 2021-10-26: 1000 mL via INTRAVENOUS

## 2021-10-26 MED ORDER — FENTANYL CITRATE (PF) 100 MCG/2ML IJ SOLN
INTRAMUSCULAR | Status: AC
  Filled 2021-10-26: qty 2

## 2021-10-26 MED ORDER — SODIUM CHLORIDE 0.9 % IR SOLN
Status: DC | PRN
  Administered 2021-10-26: 3000 mL

## 2021-10-26 MED ORDER — PROPOFOL 10 MG/ML IV BOLUS
INTRAVENOUS | Status: DC | PRN
  Administered 2021-10-26: 150 mg via INTRAVENOUS

## 2021-10-26 MED ORDER — CIPROFLOXACIN IN D5W 400 MG/200ML IV SOLN
INTRAVENOUS | Status: AC
  Filled 2021-10-26: qty 200

## 2021-10-26 MED ORDER — CIPROFLOXACIN IN D5W 400 MG/200ML IV SOLN
INTRAVENOUS | Status: DC | PRN
  Administered 2021-10-26: 400 mg via INTRAVENOUS

## 2021-10-26 MED ORDER — 0.9 % SODIUM CHLORIDE (POUR BTL) OPTIME
TOPICAL | Status: DC | PRN
  Administered 2021-10-26: 1000 mL

## 2021-10-26 MED ORDER — CIPROFLOXACIN HCL 250 MG PO TABS: 250.0000 mg | ORAL_TABLET | Freq: Two times a day (BID) | ORAL | 0 refills | Status: DC

## 2021-10-26 MED ORDER — OXYCODONE HCL 5 MG PO TABS: 5.0000 mg | ORAL_TABLET | Freq: Once | ORAL | Status: DC | PRN

## 2021-10-26 MED ORDER — PROPOFOL 10 MG/ML IV BOLUS
INTRAVENOUS | Status: AC
  Filled 2021-10-26: qty 20

## 2021-10-26 MED ORDER — FENTANYL CITRATE (PF) 100 MCG/2ML IJ SOLN
INTRAMUSCULAR | Status: DC | PRN
  Administered 2021-10-26: 25 ug via INTRAVENOUS
  Administered 2021-10-26: 50 ug via INTRAVENOUS

## 2021-10-26 MED ORDER — LORAZEPAM 2 MG/ML IJ SOLN
0.2500 mg | Freq: Once | INTRAMUSCULAR | Status: DC
  Filled 2021-10-26: qty 0.13

## 2021-10-26 MED ORDER — FENTANYL CITRATE (PF) 100 MCG/2ML IJ SOLN
25.0000 ug | INTRAMUSCULAR | Status: DC | PRN
  Administered 2021-10-26 (×2): 25 ug via INTRAVENOUS

## 2021-10-26 MED ORDER — ONDANSETRON HCL 4 MG/2ML IJ SOLN
INTRAMUSCULAR | Status: DC | PRN
  Administered 2021-10-26: 4 mg via INTRAVENOUS

## 2021-10-26 MED ORDER — IOHEXOL 300 MG/ML  SOLN
INTRAMUSCULAR | Status: DC | PRN
  Administered 2021-10-26: 17 mL

## 2021-10-26 MED ORDER — PHENYLEPHRINE HCL (PRESSORS) 10 MG/ML IV SOLN
INTRAVENOUS | Status: DC | PRN
  Administered 2021-10-26: 80 ug via INTRAVENOUS

## 2021-10-26 MED ORDER — OXYCODONE HCL 5 MG/5ML PO SOLN: 5.0000 mg | Freq: Once | ORAL | Status: DC | PRN

## 2021-10-26 MED ORDER — ONDANSETRON HCL 4 MG/2ML IJ SOLN: 4.0000 mg | Freq: Once | INTRAMUSCULAR | Status: DC | PRN

## 2021-10-26 MED ORDER — ACETAMINOPHEN 500 MG PO TABS
ORAL_TABLET | ORAL | Status: AC
  Filled 2021-10-26: qty 2

## 2021-10-26 MED ORDER — LIDOCAINE HCL (CARDIAC) PF 100 MG/5ML IV SOSY
PREFILLED_SYRINGE | INTRAVENOUS | Status: DC | PRN
  Administered 2021-10-26: 50 mg via INTRAVENOUS

## 2021-10-26 MED ORDER — ACETAMINOPHEN 500 MG PO TABS: 1000.0000 mg | ORAL_TABLET | Freq: Once | ORAL | Status: DC

## 2021-10-26 MED ORDER — AMISULPRIDE (ANTIEMETIC) 5 MG/2ML IV SOLN: 10.0000 mg | Freq: Once | INTRAVENOUS | Status: DC | PRN

## 2021-10-26 MED ORDER — DEXAMETHASONE SODIUM PHOSPHATE 4 MG/ML IJ SOLN
INTRAMUSCULAR | Status: DC | PRN
  Administered 2021-10-26: 4 mg via INTRAVENOUS

## 2021-10-26 SURGICAL SUPPLY — 30 items
BAG DRAIN URO-CYSTO SKYTR STRL (DRAIN) ×4 IMPLANT
BAG DRN UROCATH (DRAIN) ×2
BASKET ZERO TIP NITINOL 2.4FR (BASKET) ×2 IMPLANT
BSKT STON RTRVL ZERO TP 2.4FR (BASKET) ×2
CATH INTERMIT  6FR 70CM (CATHETERS) ×4 IMPLANT
CLOTH BEACON ORANGE TIMEOUT ST (SAFETY) ×4 IMPLANT
COVER DOME SNAP 22 D (MISCELLANEOUS) ×2 IMPLANT
ELECT REM PT RETURN 9FT ADLT (ELECTROSURGICAL)
ELECTRODE REM PT RTRN 9FT ADLT (ELECTROSURGICAL) IMPLANT
FIBER LASER FLEXIVA 365 (UROLOGICAL SUPPLIES) IMPLANT
FIBER LASER TRAC TIP (UROLOGICAL SUPPLIES) ×2 IMPLANT
GLOVE BIO SURGEON STRL SZ8 (GLOVE) ×4 IMPLANT
GOWN STRL REUS W/TWL XL LVL3 (GOWN DISPOSABLE) ×4 IMPLANT
GUIDEWIRE ANG ZIPWIRE 038X150 (WIRE) ×2 IMPLANT
GUIDEWIRE STR DUAL SENSOR (WIRE) ×2 IMPLANT
IV NS IRRIG 3000ML ARTHROMATIC (IV SOLUTION) ×6 IMPLANT
KIT BALLN UROMAX 15FX4 (MISCELLANEOUS) ×1 IMPLANT
KIT BALLN UROMAX 26 75X4 (MISCELLANEOUS) ×3
KIT TURNOVER CYSTO (KITS) ×4 IMPLANT
MANIFOLD NEPTUNE II (INSTRUMENTS) ×4 IMPLANT
NS IRRIG 1000ML POUR BTL (IV SOLUTION) ×2 IMPLANT
NS IRRIG 500ML POUR BTL (IV SOLUTION) ×2 IMPLANT
PACK CYSTO (CUSTOM PROCEDURE TRAY) ×4 IMPLANT
SHEATH URETERAL 12FRX28CM (UROLOGICAL SUPPLIES) ×2 IMPLANT
SHEATH URETERAL 12FRX35CM (MISCELLANEOUS) IMPLANT
STENT URET 6FRX26 CONTOUR (STENTS) ×2 IMPLANT
TRACTIP FLEXIVA PULS ID 200XHI (Laser) IMPLANT
TRACTIP FLEXIVA PULSE ID 200 (Laser)
TUBE CONNECTING 12X1/4 (SUCTIONS) ×2 IMPLANT
TUBING UROLOGY SET (TUBING) ×2 IMPLANT

## 2021-10-26 NOTE — Op Note (Signed)
Preoperative diagnosis: Left distal ureteral stone, 3 mm with significant hydroureteronephrosis of the left side  Postoperative diagnosis: Same, with left distal ureteral stricture  Procedure: Cystoscopy, left retrograde ureteropyelogram, fluoroscopic interpretation, left ureteroscopy, extraction of left distal ureteral and left upper calyceal calculi, balloon dilation of left distal ureteral stricture, placement of 6 French by 26 cm contour double-J stent without tether  Surgeon: Brindle Leyba  Anesthesia: General with LMA  Complications: None  Estimated blood loss: Less than 5 mL  Specimen: Stones  Indications: 76 year old female with history of recurrent urolithiasis.  She, on recent evaluation for follow-up of stones, was found to have significant left hydronephrosis.  This was on renal ultrasound.  CT scan revealed a 3 mm distal ureteral stone with proximal hydronephrosis.  She presents at this time for definitive management of this.  I am suspicious of a ureteral stricture as she has had prior interventions in her left ureter.  She is aware of the procedure, risk, complications as well as expected outcomes.  She desires to proceed.  Findings: Bladder was normal.  Retrograde study of the left ureter performed with Omnipaque.  This revealed a normal distal ureter but approximately 5 cm up there was a stricture and no evident proximal passage of the contrast.  Upon ureteroscopy, there was a dense stricture with the stone seen in the middle of this.  After the stone was bypassed, there was proximal hydroureteronephrosis.  The ureter was somewhat tortuous.  Significant left pyelocaliectasis.  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  The largest of these in the upper pole was dislodged for extraction.  There were no urothelial lesions.  Description of procedure: The patient was properly identified and marked in the holding area.  She was taken to the operating room where general anesthetic  was administered with the LMA.  She was placed in the dorsolithotomy position.  Genitalia and perineum were prepped, draped, proper timeout performed.  67 French panendoscope advanced into the bladder.  After systematic inspection, left retrograde ureteropyelogram performed with Omnipaque and a 6 [place] open-ended catheter.  Dense stricture prohibiting proximal passage of any contrast.  I was unable to negotiate a guidewire passed this stone/stricture.  I then passed a short dual-lumen semirigid ureteroscope up to this area.  The stone was encountered in the middle of the dense stricture.  The stone was gently negotiated/nudged into the more proximal ureter.  I was able to get the scope past this at that point.  Scope went all the way up to the UPJ, no further stone seen.  I remove the ureteroscope, dilated the distal ureter first with the obturator and then the entire 11/13 ureteral access catheter.  This was then removed, and with a guidewire indwelling I passed the scope into the proximal ureter again, grasped the stone with the nitinol basket and extracted it.  I then dilated the ureteral stricture with the 4 cm 15 French balloon dilator.  Balloon inflated to 20 atm of pressure for 3 minutes across the stricture.  It was then removed.  Ureteral access catheter was again replaced, and around the 6 French dual-lumen flexible digital ureteroscope up the ureter.  Pyelocalyceal system entered, quite capacious.  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  There was a fairly sized Randall's plaque on the upper pole calyx.  This was dislodged with the beak of the scope.  I then snared this with the basket and removed it.  No other significant stones were seen in the pyelocalyceal system.  The scope was then removed.  Guidewire was replaced, open-ended catheter removed, and guidewire backloaded through the cystoscope.  Following this 26 cm x 6 French contour double-J stent with tether removed was placed in the  left ureter.  After adequate positioning it was deployed with excellent proximal and distal curl seen following removal of the guidewire.  Bladder was drained.  Scope was removed.  Procedure was terminated, patient was awakened, taken to the PACU in stable condition.  She tolerated procedure well.

## 2021-10-26 NOTE — Anesthesia Preprocedure Evaluation (Signed)
Anesthesia Evaluation  Patient identified by MRN, date of birth, ID band Patient awake    Reviewed: Allergy & Precautions, NPO status , Patient's Chart, lab work & pertinent test results  History of Anesthesia Complications Negative for: history of anesthetic complications  Airway Mallampati: II  TM Distance: >3 FB Neck ROM: Full   Comment: Cervical spondylosis Cervical spondylosis with radiculopathy   Dental  (+) Dental Advisory Given, Teeth Intact   Pulmonary sleep apnea and Continuous Positive Airway Pressure Ventilation , former smoker,    Pulmonary exam normal breath sounds clear to auscultation       Cardiovascular Exercise Tolerance: Poor hypertension, Pt. on medications Normal cardiovascular exam Rhythm:Regular Rate:Normal     Neuro/Psych  Headaches, PSYCHIATRIC DISORDERS Anxiety Depression TIA Neuromuscular disease (Cervical spondylosis)    GI/Hepatic Neg liver ROS, GERD  Medicated and Controlled,  Endo/Other  diabetes, Well Controlled, Type 2, Oral Hypoglycemic Agents  Renal/GU Renal InsufficiencyRenal disease     Musculoskeletal  (+) Arthritis  (back pain, sciatica), Osteoarthritis,  Cervical spondylosis Cervical spondylosis with radiculopathy     Abdominal   Peds negative pediatric ROS (+)  Hematology  (+) Blood dyscrasia, anemia ,   Anesthesia Other Findings   Reproductive/Obstetrics negative OB ROS                             Anesthesia Physical Anesthesia Plan  ASA: 3  Anesthesia Plan: General   Post-op Pain Management: Tylenol PO (pre-op)* and Minimal or no pain anticipated   Induction: Intravenous  PONV Risk Score and Plan: 3 and Treatment may vary due to age or medical condition, Ondansetron and Dexamethasone  Airway Management Planned: LMA  Additional Equipment: None  Intra-op Plan:   Post-operative Plan: Extubation in OR  Informed Consent: I have  reviewed the patients History and Physical, chart, labs and discussed the procedure including the risks, benefits and alternatives for the proposed anesthesia with the patient or authorized representative who has indicated his/her understanding and acceptance.     Dental advisory given  Plan Discussed with: CRNA, Anesthesiologist and Surgeon  Anesthesia Plan Comments:         Anesthesia Quick Evaluation

## 2021-10-26 NOTE — Anesthesia Procedure Notes (Addendum)
Procedure Name: LMA Insertion Date/Time: 10/26/2021 1:42 PM Performed by: Georgeanne Nim, CRNA Pre-anesthesia Checklist: Patient identified, Emergency Drugs available, Suction available, Patient being monitored and Timeout performed Patient Re-evaluated:Patient Re-evaluated prior to induction Oxygen Delivery Method: Circle system utilized Preoxygenation: Pre-oxygenation with 100% oxygen Induction Type: IV induction Ventilation: Mask ventilation without difficulty LMA: LMA inserted LMA Size: 4.0 Number of attempts: 1 Placement Confirmation: positive ETCO2, CO2 detector and breath sounds checked- equal and bilateral Tube secured with: Tape Dental Injury: Teeth and Oropharynx as per pre-operative assessment

## 2021-10-26 NOTE — H&P (Signed)
H&P  Chief Complaint: Left ureteral stone  History of Present Illness: Cheryl Abbott is a 76 y.o. year old female presenting for ureteroscopic management of a left distal ureteral stone with significant hydronephrosis.  She has a longstanding history of urolithiasis.  Most recent CT scan (performed after follow-up ultrasound revealed significant left hydronephrosis) revealed a left distal ureteral stone.  This is asymptomatic except for her radiographic findings.  She presents now for management of this.  Past Medical History:  Diagnosis Date   Anxiety disorder    Arthritis    Chronic back pain    Chronic pain of both shoulders    per pt told due to cervical spine pinched nerve   CKD (chronic kidney disease), stage III [facility])    nephrologist--- dr Theador Hawthorne;   due to tubular necrosis   Claustrophobia    Depression    Edema of both lower extremities    GERD (gastroesophageal reflux disease)    Headache    Hemorrhoids    w/ intermittant bleeding   History of adenomatous polyp of colon    History of kidney stones    Hx-TIA (transient ischemic attack) 02/26/2016   per pt no residual   Hydronephrosis, left    followed by urologist--- dr Diona Fanti;   persistant   Hyperlipidemia    Hypertension    Iron deficiency    followed by [facility] w/ hx iron infusions   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   oncologist--- [facility];  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, active survillence   Lactose intolerance in adult 02/01/2016   Left ureteral calculus    Lumbar disc disease with radiculopathy    Nephrolithiasis    per CT 08-11-2021  left > right   Neuropathy, peripheral    Nocturia more than twice per night    Normocytic anemia    OSA (obstructive sleep apnea)    per study 2007;   per pt no Bipap use since 2021   Renal atrophy, bilateral    Renal cyst, right    Sciatica    Sigmoid diverticulosis    Type 2 diabetes mellitus ([place])     Past Surgical  History:  Procedure Laterality Date   BIOPSY N/A 03/17/2013   Procedure: GASTRIC BIOPSIES;  Surgeon: Danie Binder, MD;  Location: [facility];  Service: Endoscopy;  Laterality: N/A;   BIOPSY  06/10/2015   Procedure: BIOPSY;  Surgeon: Danie Binder, MD;  [facility];  Service: Endoscopy;;  gastric biopsy   CARDIAC CATHETERIZATION  05/11/2003   @ [facility] by dr t. Claiborne Billings;  Abnormal cardiolite/   Normal coronary arteries and normal LVF,  ef  63%   CARDIOVASCULAR STRESS TEST  01/01/2014   normal lexiscan cardiolite/  no ischemia/ infarct/  normal LV function and wall motion ,  ef 81%   COLONOSCOPY WITH PROPOFOL N/A 02/26/2017   Procedure: COLONOSCOPY WITH PROPOFOL;  Surgeon: Danie Binder, MD;  [facility];  Service: Endoscopy;  Laterality: N/A;  11:00am   COLONOSCOPY WITH PROPOFOL N/A 09/22/2019   Procedure: COLONOSCOPY WITH PROPOFOL;  Surgeon: Danie Binder, MD;  [facility];  Service: Endoscopy;  Laterality: N/A;  1:15   CYSTO/   RIGHT URETEROSOCOPY LASER LITHOTRIPSY STONE EXTRACTION  10/13/2004   CYSTO/  RIGHT RETROGRADE PYELOGRAM/  PLACMENT RIGHT URETERAL STENT  01/10/2010   CYSTOSCOPY W/ URETERAL STENT PLACEMENT Right 08/19/2015   Procedure: CYSTOSCOPY WITH  RETROGRADE PYELOGRAM/URETERAL STENT PLACEMENT;  Surgeon: Franchot Gallo, MD;  Location: [facility];  Service: Urology;  Laterality: Right;   CYSTOSCOPY W/ URETERAL STENT PLACEMENT Left 02/23/2020   Procedure: CYSTOSCOPY LEFT  RETROGRADE PYELOGRAM LEFT URETEROSCOPY URETERAL STENT PLACEMENT;  Surgeon: Franchot Gallo, MD;  Location: [facility];  Service: Urology;  Laterality: Left;   CYSTOSCOPY WITH RETROGRADE PYELOGRAM, URETEROSCOPY AND STENT PLACEMENT Left 10/21/2014   Procedure: CYSTOSCOPY WITH RETROGRADE PYELOGRAM, URETEROSCOPY AND STENT PLACEMENT;  Surgeon: Franchot Gallo, MD;  Location: [facility];  Service: Urology;  Laterality: Left;   CYSTOSCOPY WITH  RETROGRADE PYELOGRAM, URETEROSCOPY AND STENT PLACEMENT Right 11/22/2015   Procedure: CYSTOSCOPY, RIGHT URETERAL STENT REMOVAL; RIGHT RETROGRADE PYELOGRAM, RIGHT URETEROSCOPY WITH BALLOON DILATION; RIGHT URETERAL STENT PLACEMENT;  Surgeon: Franchot Gallo, MD;  [facility]: [facility];  Service: Urology;  Laterality: Right;   CYSTOSCOPY WITH RETROGRADE PYELOGRAM, URETEROSCOPY AND STENT PLACEMENT Left 10/18/2020   Procedure: CYSTOSCOPY WITH LEFT RETROGRADE PYELOGRAM, LEFT URETEROSCOPY  WITH LASER AND LEFT URETERAL STENT PLACEMENT;  Surgeon: Franchot Gallo, MD;  [facility]: [facility];  Service: Urology;  Laterality: Left;   CYSTOSCOPY/RETROGRADE/URETEROSCOPY/STONE EXTRACTION WITH BASKET Bilateral 08/02/2015   Procedure: CYSTOSCOPY; BILATERAL RETROGRADE PYELOGRAMS; BILATERAL URETEROSCOPY, STONE EXTRACTION WITH BASKET;  Surgeon: Franchot Gallo, MD;  [facility];  Service: Urology;  Laterality: Bilateral;   CYSTOSCOPY/RETROGRADE/URETEROSCOPY/STONE EXTRACTION WITH BASKET Right 06/28/2015   Procedure: CYSTOSCOPY/RETROGRADE/URETEROSCOPY/STONE EXTRACTION WITH BASKET, RIGHT URETERAL DOUBLE J STENT PLACEMENT;  Surgeon: Franchot Gallo, MD;  [facility];  Service: Urology;  Laterality: Right;   ESOPHAGOGASTRODUODENOSCOPY (EGD) WITH PROPOFOL N/A 03/17/2013   Procedure: ESOPHAGOGASTRODUODENOSCOPY (EGD) WITH PROPOFOL;  Surgeon: Danie Binder, MD;  [facility]: [facility];  Service: Endoscopy;  Laterality: N/A;   ESOPHAGOGASTRODUODENOSCOPY (EGD) WITH PROPOFOL N/A 06/10/2015   Distal gastritis, distal esophageal stricture s/p dilation   ESOPHAGOGASTRODUODENOSCOPY (EGD) WITH PROPOFOL N/A 02/26/2017   Procedure: ESOPHAGOGASTRODUODENOSCOPY (EGD) WITH PROPOFOL;  Surgeon: Danie Binder, MD;  [facility];  Service: Endoscopy;  Laterality: N/A;   FLEXIBLE SIGMOIDOSCOPY  09/11/2011   EHM:[id number] Internal hemorrhoids   HOLMIUM LASER APPLICATION Left 83/66/2947   Procedure: HOLMIUM LASER APPLICATION;   Surgeon: Franchot Gallo, MD;  Location: [facility];  Service: Urology;  Laterality: Left;   HOLMIUM LASER APPLICATION Right 65/46/5035   Procedure: HOLMIUM LASER APPLICATION;  Surgeon: Franchot Gallo, MD;  [facility]: [facility];  Service: Urology;  Laterality: Right;   HOLMIUM LASER APPLICATION  46/56/8127   Procedure: HOLMIUM LASER APPLICATION;  Surgeon: Franchot Gallo, MD;  [facility]: [facility];  Service: Urology;;   HOLMIUM LASER APPLICATION Left 51/70/0174   Procedure: HOLMIUM LASER APPLICATION;  Surgeon: Franchot Gallo, MD;  [facility]: [facility];  Service: Urology;  Laterality: Left;   MASS EXCISION Left 03/04/2015   Procedure: EXCISION OF SOFT TISSUE NEOPLASM LEFT ARM;  Surgeon: Aviva Signs, MD;  Location: [facility];  Service: General;  Laterality: Left;   PERCUTANEOUS NEPHROSTOLITHOTOMY Bilateral 05/2010   '@[facility]'$    POLYPECTOMY N/A 03/17/2013   Procedure: GASTRIC POLYPECTOMY;  Surgeon: Danie Binder, MD;  [facility];  Service: Endoscopy;  Laterality: N/A;   POLYPECTOMY  02/26/2017   Procedure: POLYPECTOMY;  Surgeon: Danie Binder, MD;  [facility];  Service: Endoscopy;;  polyp at cecum, ascending colon polyps x3, hepatic flexure polyps x8, transverse colon polyps x8    POLYPECTOMY  09/22/2019   Procedure: POLYPECTOMY;  Surgeon: Danie Binder, MD;  [facility];  Service: Endoscopy;;   REMOVAL RIGHT THIGH CYST  2006   SAVORY DILATION N/A 03/17/2013   Procedure: SAVORY DILATION;  Surgeon: Danie Binder, MD;  Location[facility];  Service: Endoscopy;  Laterality: N/A;  #12.8, 14, 15, 16 dilators used   SAVORY DILATION N/A 06/10/2015   Procedure: SAVORY DILATION;  Surgeon: Danie Binder, MD;  [facility];  Service: Endoscopy;  Laterality: N/A;   VAGINAL HYSTERECTOMY  1970's    Home Medications:  Medications Prior to Admission  Medication Sig Dispense Refill   acetaminophen (TYLENOL) 500 MG tablet Take 1,000 mg by mouth every 6  (six) hours as needed for mild pain.     amLODipine (NORVASC) 10 MG tablet TAKE 1 TABLET BY MOUTH EVERY DAY (Patient taking differently: Take 10 mg by mouth daily.) 90 tablet 1   diclofenac Sodium (VOLTAREN) 1 % GEL APPLY 4 G TOPICALLY 4 (FOUR) TIMES DAILY AS NEEDED (PAIN). 400 g 5   diphenhydramine-acetaminophen (TYLENOL PM) 25-500 MG TABS tablet Take 2 tablets by mouth at bedtime.     Dulaglutide (TRULICITY) 6.41 RA/3.0NM SOPN Inject 0.75 mg into the skin daily.     Fluticasone-Umeclidin-Vilant (TRELEGY ELLIPTA) 100-62.5-25 MCG/ACT AEPB Inhale 1 puff into the lungs daily. 1 each 11   gabapentin (NEURONTIN) 100 MG capsule Take 100 mg by mouth daily.     gabapentin (NEURONTIN) 300 MG capsule Take 300 mg by mouth at bedtime.     linagliptin (TRADJENTA) 5 MG TABS tablet Take 1 tablet (5 mg total) by mouth daily. (Patient taking differently: Take 5 mg by mouth daily.) 30 tablet 5   nitroGLYCERIN (NITROSTAT) 0.4 MG SL tablet Place 1 tablet (0.4 mg total) under the tongue every 5 (five) minutes as needed for chest pain. Up to 3 tablets, then call 911. 25 tablet 3   OneTouch Delica Lancets 07W MISC Once daily testing dx e11.9 100 each 5   ONETOUCH ULTRA test strip USE AS INSTRUCTED ONCE DAILY DX E11.9 100 strip 5   PARoxetine (PAXIL) 40 MG tablet TAKE 1 TABLET BY MOUTH EVERY DAY IN THE MORNING (Patient taking differently: Take 40 mg by mouth in the morning.) 90 tablet 1   rosuvastatin (CRESTOR) 20 MG tablet TAKE 1 TABLET BY MOUTH EVERY DAY (Patient taking differently: Take 20 mg by mouth.) 90 tablet 3   SYMBICORT 80-4.5 MCG/ACT inhaler Inhale 2 puffs into the lungs 2 (two) times daily.     traMADol (ULTRAM) 50 MG tablet TAKE 1 TABLET BY MOUTH EVERY 6 HOURS AS NEEDED 60 tablet 0   triamcinolone cream (KENALOG) 0.1 % Apply 1 application topically 2 (two) times daily. (Patient taking differently: Apply 1 application. topically 2 (two) times daily as needed.) 30 g 0   bisacodyl (DULCOLAX) 10 MG suppository  Place 1 suppository (10 mg total) rectally as needed for moderate constipation. (Patient not taking: Reported on 10/23/2021) 12 suppository 0   bisacodyl (DULCOLAX) 5 MG EC tablet Take 5 mg by mouth daily as needed for moderate constipation.     docusate sodium (COLACE) 100 MG capsule Take 1 capsule (100 mg total) by mouth 2 (two) times daily. (Patient taking differently: Take 100 mg by mouth 2 (two) times daily as needed.) 10 capsule 0   pantoprazole (PROTONIX) 40 MG tablet Take 1 tablet (40 mg total) by mouth 2 (two) times daily. 180 tablet 1   UNABLE TO FIND Diabetic shoes x 1 pair, inserts x 3 pair  DX E11.9 1 each 0    Allergies:  Allergies  Allergen Reactions  Ace Inhibitors Cough    Patient doesn't recall   Keflex [Cephalexin] Diarrhea and Nausea And Vomiting   Nitrofurantoin Diarrhea and Nausea And Vomiting   Penicillins Hives and Other (See Comments)    Reaction:  Blisters on hands/feet  Has patient had a PCN reaction causing immediate rash, facial/tongue/throat swelling, SOB or lightheadedness with hypotension: No Has patient had a PCN reaction causing severe rash involving mucus membranes or skin necrosis: No Has patient had a PCN reaction that required hospitalization No Has patient had a PCN reaction occurring within the last 10 years: No If all of the above answers are "NO", then may proceed with Cephalosporin use.    Family History  Problem Relation Age of Onset   Hypertension Mother    Diabetes Mother    Heart failure Mother    Dementia Mother    Emphysema Father    Hypertension Father    Diabetes Brother    GER disease Brother    Hypertension Sister    Hypertension Sister    Cancer Other        family history    Diabetes Other        family history    Heart defect Other        famiily history    Arthritis Other        family history    Anesthesia problems Neg Hx    Hypotension Neg Hx    Malignant hyperthermia Neg Hx    Pseudochol deficiency Neg Hx     Colon cancer Neg Hx     Social History:  reports that she quit smoking about 33 years ago. Her smoking use included cigarettes. She started smoking about 47 years ago. She has a 20.00 pack-year smoking history. She has never used smokeless tobacco. She reports that she does not drink alcohol and does not use drugs.  ROS: A complete review of systems was performed.  All systems are negative except for pertinent findings as noted.  Physical Exam:  Vital signs in last 24 hours: Temp:  [98.8 F (37.1 C)] 98.8 F (37.1 C) (05/18 1245) Pulse Rate:  [92] 92 (05/18 1245) Resp:  [17] 17 (05/18 1245) BP: (131)/(77) 131/77 (05/18 1245) SpO2:  [100 %] 100 % (05/18 1245) Weight:  [84.2 kg] 84.2 kg (05/18 1245) General:  Alert and oriented, No acute distress HEENT: Normocephalic, atraumatic Neck: No JVD or lymphadenopathy Cardiovascular: Regular rate  Lungs: Normal inspiratory/expiratory excursion Abdomen: Soft, nontender, nondistended, no abdominal masses Back: No CVA tenderness Extremities: No edema Neurologic: Grossly intact  I have reviewed prior pt notes  I have reviewed notes from referring/previous physicians  I have reviewed urinalysis results  I have independently reviewed prior imaging   Impression/Assessment:  Left distal ureteral stone with significant hydronephrosis  Plan:  Cystoscopy, left retrograde ureteropyelogram, left ureteroscopic stone management, double-J stent placement  Lillette Boxer Avraham Benish 10/26/2021, 1:27 PM  Lillette Boxer. Jessicca Stitzer MD

## 2021-10-26 NOTE — Transfer of Care (Signed)
Immediate Anesthesia Transfer of Care Note  Patient: Cheryl Abbott  Procedure(s) Performed: CYSTOSCOPY WITH RETROGRADE PYELOGRAM, URETEROSCOPY, STONE EXTRACTION  AND STENT PLACEMENT (Left: Pelvis)  Patient Location: PACU  Anesthesia Type:General  Level of Consciousness: awake, alert , oriented and patient cooperative  Airway & Oxygen Therapy: Patient Spontanous Breathing and Patient connected to nasal cannula oxygen  Post-op Assessment: Report given to RN and Post -op Vital signs reviewed and stable  Post vital signs: Reviewed and stable  Last Vitals:  Vitals Value Taken Time  BP 137/78 10/26/21 1432  Temp    Pulse 88 10/26/21 1436  Resp 15 10/26/21 1436  SpO2 93 % 10/26/21 1436  Vitals shown include unvalidated device data.  Last Pain:  Vitals:   10/26/21 1245  TempSrc: Oral  PainSc: 0-No pain      Patients Stated Pain Goal: 4 (45/40/98 1191)  Complications: No notable events documented.

## 2021-10-26 NOTE — Discharge Instructions (Addendum)
You may see some blood in the urine and may have some burning with urination for 48-72 hours. You also may notice that you have to urinate more frequently or urgently after your procedure which is normal.  You should call should you develop an inability urinate, fever > 101, persistent nausea and vomiting that prevents you from eating or drinking to stay hydrated.  If you have a stent, you will likely urinate more frequently and urgently until the stent is removed and you may experience some discomfort/pain in the lower abdomen and flank especially when urinating. You may take pain medication prescribed to you if needed for pain. You may also intermittently have blood in the urine until the stent is removed.     Post Anesthesia Home Care Instructions  Activity: Get plenty of rest for the remainder of the day. A responsible individual must stay with you for 24 hours following the procedure.  For the next 24 hours, DO NOT: -Drive a car -Paediatric nurse -Drink alcoholic beverages -Take any medication unless instructed by your physician -Make any legal decisions or sign important papers.  Meals: Start with liquid foods such as gelatin or soup. Progress to regular foods as tolerated. Avoid greasy, spicy, heavy foods. If nausea and/or vomiting occur, drink only clear liquids until the nausea and/or vomiting subsides. Call your physician if vomiting continues.  Special Instructions/Symptoms: Your throat may feel dry or sore from the anesthesia or the breathing tube placed in your throat during surgery. If this causes discomfort, gargle with warm salt water. The discomfort should disappear within 24 hours.  If you had a scopolamine patch placed behind your ear for the management of post- operative nausea and/or vomiting:  1. The medication in the patch is effective for 72 hours, after which it should be removed.  Wrap patch in a tissue and discard in the trash. Wash hands thoroughly with soap and  water. 2. You may remove the patch earlier than 72 hours if you experience unpleasant side effects which may include dry mouth, dizziness or visual disturbances. 3. Avoid touching the patch. Wash your hands with soap and water after contact with the patch.    [facility] [phone] Post Ureteroscopy With or Without Stent Instructions  Definitions:  Ureter: The duct that transports urine from the kidney to the bladder. Stent:   A plastic hollow tube that is placed into the ureter, from the kidney to the bladder to prevent the ureter from swelling shut.  GENERAL INSTRUCTIONS:  Despite the fact that no skin incisions were used, the area around the ureter and bladder is raw and irritated. The stent is a foreign body which will further irritate the bladder wall. This irritation is manifested by increased frequency of urination, both day and night, and by an increase in the urge to urinate. In some, the urge to urinate is present almost always. Sometimes the urge is strong enough that you may not be able to stop yourself from urinating. The only real cure is to remove the stent and then give time for the bladder wall to heal which can't be done until the danger of the ureter swelling shut has passed, which varies.  You may see some blood in your urine while the stent is in place and a few days afterwards. Do not be alarmed, even if the urine was clear for a while. Get off your feet and drink lots of fluids until clearing occurs. If you start to pass clots or don't  improve, call us.  DIET: You may return to your normal diet immediately. Because of the raw surface of your bladder, alcohol, spicy foods, acid type foods and drinks with caffeine may cause irritation or frequency and should be used in moderation. To keep your urine flowing freely and to avoid constipation, drink plenty of fluids during the day ( 8-10 glasses ). Tip: Avoid cranberry juice because it is very  acidic.  ACTIVITY: Your physical activity doesn't need to be restricted. However, if you are very active, you may see some blood in your urine. We suggest that you reduce your activity under these circumstances until the bleeding has stopped.  BOWELS: It is important to keep your bowels regular during the postoperative period. Straining with bowel movements can cause bleeding. A bowel movement every other day is reasonable. Use a mild laxative if needed, such as Milk of Magnesia 2-3 tablespoons, or 2 Dulcolax tablets. Call if you continue to have problems. If you have been taking narcotics for pain, before, during or after your surgery, you may be constipated. Take a laxative if necessary.   MEDICATION: You should resume your pre-surgery medications unless told not to. In addition you will often be given an antibiotic to prevent infection. These should be taken as prescribed until the bottles are finished unless you are having an unusual reaction to one of the drugs.  PROBLEMS YOU SHOULD REPORT TO [place]: Fevers over 100.5 Fahrenheit. Heavy bleeding, or clots ( See above notes about blood in urine ). Inability to urinate. Drug reactions ( hives, rash, nausea, vomiting, diarrhea ). Severe burning or pain with urination that is not improving.  FOLLOW-UP: You will need a follow-up appointment to monitor your progress. Call for this appointment at the number listed above. Usually the first appointment will be about three to fourteen days after your surgery.

## 2021-10-27 NOTE — Anesthesia Postprocedure Evaluation (Signed)
Anesthesia Post Note  Patient: Cheryl Abbott  Procedure(s) Performed: CYSTOSCOPY WITH RETROGRADE PYELOGRAM, URETEROSCOPY, STONE EXTRACTION  AND STENT PLACEMENT (Left: Pelvis)     Patient location during evaluation: PACU Anesthesia Type: General Level of consciousness: awake and alert Pain management: pain level controlled Vital Signs Assessment: post-procedure vital signs reviewed and stable Respiratory status: spontaneous breathing, nonlabored ventilation and respiratory function stable Cardiovascular status: blood pressure returned to baseline and stable Postop Assessment: no apparent nausea or vomiting Anesthetic complications: no   No notable events documented.  Last Vitals:  Vitals:   10/26/21 1602 10/26/21 1825  BP:  (!) 145/86  Pulse: 80 92  Resp: 16 15  Temp: 36.6 C 36.6 C  SpO2: 97% 94%    Last Pain:  Vitals:   10/26/21 1825  TempSrc:   PainSc: 2                  Merlinda Frederick

## 2021-10-30 ENCOUNTER — Encounter (HOSPITAL_BASED_OUTPATIENT_CLINIC_OR_DEPARTMENT_OTHER): Payer: Self-pay | Admitting: Urology

## 2021-11-16 ENCOUNTER — Telehealth: Payer: Self-pay

## 2021-11-16 NOTE — Telephone Encounter (Addendum)
Patient states she is still bleeding a lot after surgery and is having a lot of pelvic pressure.  She wants to know if she can have something for the pain.  Please advise.

## 2021-11-17 ENCOUNTER — Other Ambulatory Visit: Payer: Self-pay | Admitting: Orthopedic Surgery

## 2021-11-17 DIAGNOSIS — R0902 Hypoxemia: Secondary | ICD-10-CM | POA: Diagnosis not present

## 2021-11-17 DIAGNOSIS — M25512 Pain in left shoulder: Secondary | ICD-10-CM

## 2021-11-19 ENCOUNTER — Emergency Department (HOSPITAL_COMMUNITY)
Admission: EM | Admit: 2021-11-19 | Discharge: 2021-11-19 | Disposition: A | Discharge status: Home / Self Care | Payer: Medicare Other | Attending: Emergency Medicine | Admitting: Emergency Medicine

## 2021-11-19 ENCOUNTER — Other Ambulatory Visit: Payer: Self-pay

## 2021-11-19 ENCOUNTER — Encounter (HOSPITAL_COMMUNITY): Payer: Self-pay | Admitting: *Deleted

## 2021-11-19 ENCOUNTER — Emergency Department (HOSPITAL_COMMUNITY): Payer: Medicare Other

## 2021-11-19 DIAGNOSIS — Z794 Long term (current) use of insulin: Secondary | ICD-10-CM | POA: Insufficient documentation

## 2021-11-19 DIAGNOSIS — I129 Hypertensive chronic kidney disease with stage 1 through stage 4 chronic kidney disease, or unspecified chronic kidney disease: Secondary | ICD-10-CM | POA: Insufficient documentation

## 2021-11-19 DIAGNOSIS — E1122 Type 2 diabetes mellitus with diabetic chronic kidney disease: Secondary | ICD-10-CM | POA: Insufficient documentation

## 2021-11-19 DIAGNOSIS — N183 Chronic kidney disease, stage 3 unspecified: Secondary | ICD-10-CM | POA: Insufficient documentation

## 2021-11-19 DIAGNOSIS — N3000 Acute cystitis without hematuria: Secondary | ICD-10-CM | POA: Diagnosis not present

## 2021-11-19 DIAGNOSIS — N200 Calculus of kidney: Secondary | ICD-10-CM | POA: Diagnosis not present

## 2021-11-19 DIAGNOSIS — K5732 Diverticulitis of large intestine without perforation or abscess without bleeding: Secondary | ICD-10-CM | POA: Diagnosis not present

## 2021-11-19 DIAGNOSIS — I700 Atherosclerosis of aorta: Secondary | ICD-10-CM | POA: Diagnosis not present

## 2021-11-19 DIAGNOSIS — Z7984 Long term (current) use of oral hypoglycemic drugs: Secondary | ICD-10-CM | POA: Diagnosis not present

## 2021-11-19 DIAGNOSIS — R300 Dysuria: Secondary | ICD-10-CM | POA: Diagnosis present

## 2021-11-19 LAB — URINALYSIS, ROUTINE W REFLEX MICROSCOPIC
Bacteria, UA: NONE SEEN
Bilirubin Urine: NEGATIVE
Glucose, UA: 50 mg/dL — AB
Ketones, ur: NEGATIVE mg/dL
Nitrite: NEGATIVE
Protein, ur: >=300 mg/dL — AB
RBC / HPF: >50 RBC/hpf — ABNORMAL HIGH (ref 0–5)
Specific Gravity, Urine: 1.022 (ref 1.005–1.030)
WBC, UA: >50 WBC/hpf — ABNORMAL HIGH (ref 0–5)
pH: 6 (ref 5.0–8.0)

## 2021-11-19 LAB — CBC
HCT: 40.5 % (ref 36.0–46.0)
Hemoglobin: 13.6 g/dL (ref 12.0–15.0)
MCH: 32.7 pg (ref 26.0–34.0)
MCHC: 33.6 g/dL (ref 30.0–36.0)
MCV: 97.4 fL (ref 80.0–100.0)
Platelets: 307 10*3/uL (ref 150–400)
RBC: 4.16 MIL/uL (ref 3.87–5.11)
RDW: 15.1 % (ref 11.5–15.5)
WBC: 9.5 10*3/uL (ref 4.0–10.5)
nRBC: 0 % (ref 0.0–0.2)

## 2021-11-19 LAB — BASIC METABOLIC PANEL
Anion gap: 5 (ref 5–15)
BUN: 18 mg/dL (ref 8–23)
CO2: 24 mmol/L (ref 22–32)
Calcium: 10.4 mg/dL — ABNORMAL HIGH (ref 8.9–10.3)
Chloride: 109 mmol/L (ref 98–111)
Creatinine, Ser: 1.71 mg/dL — ABNORMAL HIGH (ref 0.44–1.00)
GFR, Estimated: 30 mL/min — ABNORMAL LOW (ref >60)
Glucose, Bld: 107 mg/dL — ABNORMAL HIGH (ref 70–99)
Potassium: 3.8 mmol/L (ref 3.5–5.1)
Sodium: 138 mmol/L (ref 135–145)

## 2021-11-19 MED ORDER — CIPROFLOXACIN HCL 500 MG PO TABS: 500.0000 mg | ORAL_TABLET | Freq: Two times a day (BID) | ORAL | 0 refills | Status: DC

## 2021-11-19 MED ORDER — MORPHINE SULFATE (PF) 2 MG/ML IV SOLN
2.0000 mg | Freq: Once | INTRAVENOUS | Status: AC
  Administered 2021-11-19: 2 mg via INTRAVENOUS
  Filled 2021-11-19: qty 1

## 2021-11-19 NOTE — Discharge Instructions (Signed)
Please follow-up with your urologist as soon as possible for resolution of symptoms.  Continue taking your at home pain medications including your tramadol.  Given your recent prescription of tramadol and the fact that this medication decreases the efficacy of other narcotic pain medications I would not recommend any additional prescription like we are discussing.  Please return to the emergency department if you been develop significant fever, chills, nausea, vomiting, or any signs of worsening infection.  Please follow-up with your urologist at your earliest convenience for recheck of resolution of symptoms.

## 2021-11-19 NOTE — ED Triage Notes (Signed)
Pt states burning with urination and blood noted in urine.  Pt with recent kidney stone surgery back in mid May.

## 2021-11-19 NOTE — ED Provider Notes (Signed)
[facility] EMERGENCY DEPARTMENT Provider Note   CSN: [id number] Arrival date & time: 11/19/21  1620     History  Chief Complaint  Patient presents with   Dysuria    Cheryl Abbott is a 76 y.o. female with past medical history significant for hyperlipidemia, type 2 diabetes, acid reflux, CKD stage III, multiple myeloma who presents with concern for burning with urination, bloody urine since ureteral stent placement for kidney stones on 5/18.  Patient reports that since then she has been having pain with urination.  She tried to contact her surgeon to discuss the symptoms and be seen for further evaluation of the stent, however has not been able to see her surgeon since then.  She denies fever, chills.  She reports sharp stabbing pain in the flank.   Dysuria Associated symptoms: flank pain        Home Medications Prior to Admission medications   Medication Sig Start Date End Date Taking? Authorizing Provider  ciprofloxacin (CIPRO) 500 MG tablet Take 1 tablet (500 mg total) by mouth every 12 (twelve) hours. 11/19/21  Yes Tayvin Preslar H, PA-C  acetaminophen (TYLENOL) 500 MG tablet Take 1,000 mg by mouth every 6 (six) hours as needed for mild pain.    [provider]  amLODipine (NORVASC) 10 MG tablet TAKE 1 TABLET BY MOUTH EVERY DAY Patient taking differently: Take 10 mg by mouth daily. 10/13/21   [name], MD  bisacodyl (DULCOLAX) 10 MG suppository Place 1 suppository (10 mg total) rectally as needed for moderate constipation. Patient not taking: Reported on 10/23/2021 05/18/21   Harriett Rush, PA-C  bisacodyl (DULCOLAX) 5 MG EC tablet Take 5 mg by mouth daily as needed for moderate constipation.    [provider]  diclofenac Sodium (VOLTAREN) 1 % GEL APPLY 4 G TOPICALLY 4 (FOUR) TIMES DAILY AS NEEDED (PAIN). 06/13/21   Carole Civil, MD  diphenhydramine-acetaminophen (TYLENOL PM) 25-500 MG TABS tablet Take 2 tablets by mouth at bedtime.     [provider]  docusate sodium (COLACE) 100 MG capsule Take 1 capsule (100 mg total) by mouth 2 (two) times daily. Patient taking differently: Take 100 mg by mouth 2 (two) times daily as needed. 05/18/21   Harriett Rush, PA-C  Dulaglutide (TRULICITY) 2.12 YQ/8.2NO SOPN Inject 0.75 mg into the skin daily.    [provider]  Fluticasone-Umeclidin-Vilant (TRELEGY ELLIPTA) 100-62.5-25 MCG/ACT AEPB Inhale 1 puff into the lungs daily. 07/20/21   [name], MD  gabapentin (NEURONTIN) 100 MG capsule Take 100 mg by mouth daily.    [provider]  gabapentin (NEURONTIN) 300 MG capsule Take 300 mg by mouth at bedtime. 08/07/21   [provider]  linagliptin (TRADJENTA) 5 MG TABS tablet Take 1 tablet (5 mg total) by mouth daily. Patient taking differently: Take 5 mg by mouth daily. 07/06/21   [name], MD  nitroGLYCERIN (NITROSTAT) 0.4 MG SL tablet Place 1 tablet (0.4 mg total) under the tongue every 5 (five) minutes as needed for chest pain. Up to 3 tablets, then call 911. 07/19/21 10/23/21  Strader, Fransisco Hertz, PA-C  OneTouch Delica Lancets 03B MISC Once daily testing dx e11.9 01/07/20   [name], MD  [facility] ULTRA test strip USE AS INSTRUCTED ONCE DAILY DX E11.9 07/12/21   [name], MD  pantoprazole (PROTONIX) 40 MG tablet Take 1 tablet (40 mg total) by mouth 2 (two) times daily. 10/24/21   [name], MD  PARoxetine (PAXIL) 40 MG tablet TAKE 1 TABLET BY MOUTH EVERY DAY IN THE MORNING Patient taking differently: Take 40 mg by mouth in the morning. 05/29/21   [name], MD  rosuvastatin (CRESTOR) 20 MG tablet TAKE 1 TABLET BY MOUTH EVERY DAY Patient taking differently: Take 20 mg by mouth. 08/08/21   [name], MD  SYMBICORT 80-4.5 MCG/ACT inhaler Inhale 2 puffs into the lungs 2 (two) times daily. 08/07/21   [provider]  traMADol (ULTRAM) 50 MG tablet TAKE 1 TABLET BY MOUTH EVERY 6 HOURS  AS NEEDED 10/24/21   Carole Civil, MD  triamcinolone cream (KENALOG) 0.1 % Apply 1 application topically 2 (two) times daily. Patient taking differently: Apply 1 application. topically 2 (two) times daily as needed. 07/28/21   Renee Rival, FNP  UNABLE TO FIND Diabetic shoes x 1 pair, inserts x 3 pair  DX E11.9 05/09/21   [name], MD      Allergies    Ace inhibitors, Keflex [cephalexin], Nitrofurantoin, and Penicillins    Review of Systems   Review of Systems  Genitourinary:  Positive for dysuria and flank pain.  All other systems reviewed and are negative.   Physical Exam Updated Vital Signs BP (!) 130/100 (BP Location: Right Arm)   Pulse 99   Temp 98.3 F (36.8 C) (Oral)   Resp 16   Wt 84.4 kg   SpO2 100%   BMI 30.02 kg/m  Physical Exam Vitals and nursing note reviewed.  Constitutional:      General: She is not in acute distress.    Appearance: Normal appearance.  HENT:     Head: Normocephalic and atraumatic.  Eyes:     General:        Right eye: No discharge.        Left eye: No discharge.  Cardiovascular:     Rate and Rhythm: Normal rate and regular rhythm.     Heart sounds: No murmur heard.    No friction rub. No gallop.  Pulmonary:     Effort: Pulmonary effort is normal.     Breath sounds: Normal breath sounds.  Abdominal:     General: Bowel sounds are normal.     Palpations: Abdomen is soft.     Comments: Tenderness palpation throughout the abdomen, most focally in suprapubic region and left flank.  Positive CVA tenderness on the left.  Skin:    General: Skin is warm and dry.     Capillary Refill: Capillary refill takes less than 2 seconds.  Neurological:     Mental Status: She is alert and oriented to person, place, and time.  Psychiatric:        Mood and Affect: Mood normal.        Behavior: Behavior normal.     ED Results / Procedures / Treatments   Labs (all labs ordered are listed, but only abnormal results are  displayed) Labs Reviewed  URINALYSIS, ROUTINE W REFLEX MICROSCOPIC - Abnormal; Notable for the following components:      Result Value   Color, Urine YELLOW (*)    APPearance CLOUDY (*)    Glucose, UA 50 (*)    Hgb urine dipstick LARGE (*)    Protein, ur >=300 (*)    Leukocytes,Ua SMALL (*)    RBC / HPF >50 (*)    WBC, UA >50 (*)    All other components within normal limits  BASIC METABOLIC PANEL - Abnormal; Notable for the following  components:   Glucose, Bld 107 (*)    Creatinine, Ser 1.71 (*)    Calcium 10.4 (*)    GFR, Estimated 30 (*)    All other components within normal limits  URINE CULTURE  CBC    EKG None  Radiology CT Renal Stone Study  Result Date: 11/19/2021 CLINICAL DATA:  Flank pain, renal calculus suspected, recent left ureteral stenting EXAM: CT ABDOMEN AND PELVIS WITHOUT CONTRAST TECHNIQUE: Multidetector CT imaging of the abdomen and pelvis was performed following the standard protocol without IV contrast. RADIATION DOSE REDUCTION: This exam was performed according to the departmental dose-optimization program which includes automated exposure control, adjustment of the mA and/or kV according to patient size and/or use of iterative reconstruction technique. COMPARISON:  08/11/2021 FINDINGS: Lower chest: No acute abnormality. Unchanged elevation of the right hemidiaphragm. Hepatobiliary: No solid liver abnormality is seen. No gallstones, gallbladder wall thickening, or biliary dilatation. Pancreas: Unremarkable. No pancreatic ductal dilatation or surrounding inflammatory changes. Spleen: Normal in size without significant abnormality. Adrenals/Urinary Tract: Adrenal glands are unremarkable. Multiple small bilateral renal calculi, more numerous on the left. Interval placement of a left-sided double-J ureteral stent, with formed pigtails in the superior pole of the left kidney and urinary bladder. There is new fat stranding about the ureter (series 2, image 43). Bladder is  unremarkable. Stomach/Bowel: Stomach is within normal limits. Appendix appears normal. No evidence of bowel wall thickening, distention, or inflammatory changes. Descending and sigmoid diverticulosis. Vascular/Lymphatic: Aortic atherosclerosis. No enlarged abdominal or pelvic lymph nodes. Reproductive: Status post hysterectomy. Other: No abdominal wall hernia or abnormality. No ascites. Musculoskeletal: No acute or significant osseous findings. IMPRESSION: 1. Interval placement of a left-sided double-J ureteral stent, with formed pigtails in the superior pole of the left kidney and urinary bladder. Previously seen hydronephrosis and hydroureter is resolved, however there is new fat stranding about the ureter suggesting infection or inflammation. 2. Bilateral nonobstructive nephrolithiasis. 3. Descending and sigmoid diverticulosis without evidence of acute diverticulitis. Aortic Atherosclerosis (ICD10-I70.0). Electronically Signed   By: Delanna Ahmadi M.D.   On: 11/19/2021 19:55    Procedures Procedures    Medications Ordered in ED Medications  morphine (PF) 2 MG/ML injection 2 mg (2 mg Intravenous Given 11/19/21 2003)    ED Course/ Medical Decision Making/ A&P Clinical Course as of 11/19/21 2220  Sun Nov 19, 2021  2041 Dr. Gloriann Loan [CP]    Clinical Course User Index [CP] Anselmo Pickler, PA-C                           Medical Decision Making Amount and/or Complexity of Data Reviewed Labs: ordered. Radiology: ordered.  Risk Prescription drug management.   This patient is a 76 y.o. female who presents to the ED for concern of burning with urination and blood noted in urine since ureteral stent placed on 5/18, this involves an extensive number of treatment options, and is a complaint that carries with it a high risk of complications and morbidity. The emergent differential diagnosis prior to evaluation includes, but is not limited to, acute pyelonephritis, nephrolithiasis, UTI, infected  stent, sepsis, other intra-abdominal infection versus other.   This is not an exhaustive differential.   Past Medical History / Co-morbidities / Social History: Patient with multiple myeloma, type 2 diabetes, hyperlipidemia, hypertension, previous history of kidney stones, CKD  Additional history: Chart reviewed. Pertinent results include: Reviewed the operative note from patient's ureteral stent placement, as well as recent lab work,  imaging  Physical Exam: Physical exam performed. The pertinent findings include: Patient with some tenderness palpation in the left lower quadrant/left flank  Lab Tests: I ordered, and personally interpreted labs.  The pertinent results include: Patient with overall reassuring lab work.  Her CBC is unremarkable.  Her BMP  shows a minimally elevated calcium at 10.4, creatinine which is stable compared to her baseline.  No evidence of acute kidney injury.  Her urinalysis is suggestive of a new urinary tract infection with leukocytes, greater than 50 white blood cells, and white blood cell clumps.  We will send off for culture nonetheless   Imaging Studies: I ordered imaging studies including CT stone study. I independently visualized and interpreted imaging which showed no evidence of new stone, no malposition of stent, some evidence of stranding and inflammation around the ureteral stent. I agree with the radiologist interpretation.   Medications: I ordered medication including morphine  for pain. Reevaluation of the patient after these medicines showed that the patient improved. I have reviewed the patients home medicines and have made adjustments as needed.  Consultations Obtained: I requested consultation with the urologist, spoke with Dr. Gloriann Loan,  and discussed lab and imaging findings as well as pertinent plan - they recommend: Treatment as though simple cystitis with antibiotic.  Given patient's allergies we will treat with Cipro.  We will have patient  follow-up with her urologist Dr. Diona Fanti at her earliest convenience for further evaluation and monitoring of resolution of symptoms.   Disposition: After consideration of the diagnostic results and the patients response to treatment, I feel that patient appears stable for discharge at this time, despite her multiple comorbidities she has stable vital signs, no leukocytosis, and is no acute distress at time of discharge.  Extensive return precautions given.   I discussed this case with my attending physician Dr. Almyra Free who cosigned this note including patient's presenting symptoms, physical exam, and planned diagnostics and interventions. Attending physician stated agreement with plan or made changes to plan which were implemented.    Final Clinical Impression(s) / ED Diagnoses Final diagnoses:  Acute cystitis without hematuria    Rx / DC Orders ED Discharge Orders          Ordered    ciprofloxacin (CIPRO) 500 MG tablet  Every 12 hours        11/19/21 2102              Dorien Chihuahua 11/19/21 2220    Luna Fuse, MD 11/27/21 1646

## 2021-11-20 NOTE — Progress Notes (Signed)
History of Present Illness: Cheryl Abbott is a 76 y.o. year old female status post recent cystoscopy, left retrograde, ureteroscopy, extraction of left distal ureteral and left upper calyceal calculi dilation of her with stent placement.  That was performed on May 18.  Overall she has been doing well since that time.  She does have discomfort from her stent and intermittent hematuria.  No fever, no chills.  Here for stent removal.  Past Medical History:  Diagnosis Date   Anxiety disorder    Arthritis    Chronic back pain    Chronic pain of both shoulders    per pt told due to cervical spine pinched nerve   CKD (chronic kidney disease), stage III [facility])    nephrologist--- dr Theador Hawthorne;   due to tubular necrosis   Claustrophobia    Depression    Edema of both lower extremities    GERD (gastroesophageal reflux disease)    Headache    Hemorrhoids    w/ intermittant bleeding   History of adenomatous polyp of colon    History of kidney stones    Hx-TIA (transient ischemic attack) 02/26/2016   per pt no residual   Hydronephrosis, left    followed by urologist--- dr Diona Fanti;   persistant   Hyperlipidemia    Hypertension    Iron deficiency    followed by [facility] w/ hx iron infusions   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   oncologist--- [facility];  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, active survillence   Lactose intolerance in adult 02/01/2016   Left ureteral calculus    Lumbar disc disease with radiculopathy    Nephrolithiasis    per CT 08-11-2021  left > right   Neuropathy, peripheral    Nocturia more than twice per night    Normocytic anemia    OSA (obstructive sleep apnea)    per study 2007;   per pt no Bipap use since 2021   Renal atrophy, bilateral    Renal cyst, right    Sciatica    Sigmoid diverticulosis    Type 2 diabetes mellitus ([place])     Past Surgical History:  Procedure Laterality Date   BIOPSY N/A 03/17/2013   Procedure:  GASTRIC BIOPSIES;  Surgeon: Danie Binder, MD;  Location: [facility];  Service: Endoscopy;  Laterality: N/A;   BIOPSY  06/10/2015   Procedure: BIOPSY;  Surgeon: Danie Binder, MD;  [facility]: [facility];  Service: Endoscopy;;  gastric biopsy   CARDIAC CATHETERIZATION  05/11/2003   @ [place] [facility] by dr t. Claiborne Billings;  Abnormal cardiolite/   Normal coronary arteries and normal LVF,  ef  63%   CARDIOVASCULAR STRESS TEST  01/01/2014   normal lexiscan cardiolite/  no ischemia/ infarct/  normal LV function and wall motion ,  ef 81%   COLONOSCOPY WITH PROPOFOL N/A 02/26/2017   Procedure: COLONOSCOPY WITH PROPOFOL;  Surgeon: Danie Binder, MD;  [facility];  Service: Endoscopy;  Laterality: N/A;  11:00am   COLONOSCOPY WITH PROPOFOL N/A 09/22/2019   Procedure: COLONOSCOPY WITH PROPOFOL;  Surgeon: Danie Binder, MD;  [facility];  Service: Endoscopy;  Laterality: N/A;  1:15   CYSTO/   RIGHT URETEROSOCOPY LASER LITHOTRIPSY STONE EXTRACTION  10/13/2004   CYSTO/  RIGHT RETROGRADE PYELOGRAM/  PLACMENT RIGHT URETERAL STENT  01/10/2010   CYSTOSCOPY W/ URETERAL STENT PLACEMENT Right 08/19/2015   Procedure: CYSTOSCOPY WITH RETROGRADE PYELOGRAM/URETERAL STENT  PLACEMENT;  Surgeon: Franchot Gallo, MD;  Location: [facility];  Service: Urology;  Laterality: Right;   CYSTOSCOPY W/ URETERAL STENT PLACEMENT Left 02/23/2020   Procedure: CYSTOSCOPY LEFT  RETROGRADE PYELOGRAM LEFT URETEROSCOPY URETERAL STENT PLACEMENT;  Surgeon: Franchot Gallo, MD;  Location: [facility];  Service: Urology;  Laterality: Left;   CYSTOSCOPY WITH RETROGRADE PYELOGRAM, URETEROSCOPY AND STENT PLACEMENT Left 10/21/2014   Procedure: CYSTOSCOPY WITH RETROGRADE PYELOGRAM, URETEROSCOPY AND STENT PLACEMENT;  Surgeon: Franchot Gallo, MD;  Location[facility];  Service: Urology;  Laterality: Left;   CYSTOSCOPY WITH RETROGRADE PYELOGRAM, URETEROSCOPY AND STENT PLACEMENT Right 11/22/2015    Procedure: CYSTOSCOPY, RIGHT URETERAL STENT REMOVAL; RIGHT RETROGRADE PYELOGRAM, RIGHT URETEROSCOPY WITH BALLOON DILATION; RIGHT URETERAL STENT PLACEMENT;  Surgeon: Franchot Gallo, MD;  Location: [facility];  Service: Urology;  Laterality: Right;   CYSTOSCOPY WITH RETROGRADE PYELOGRAM, URETEROSCOPY AND STENT PLACEMENT Left 10/18/2020   Procedure: CYSTOSCOPY WITH LEFT RETROGRADE PYELOGRAM, LEFT URETEROSCOPY  WITH LASER AND LEFT URETERAL STENT PLACEMENT;  Surgeon: Franchot Gallo, MD;  Location[facility];  Service: Urology;  Laterality: Left;   CYSTOSCOPY WITH RETROGRADE PYELOGRAM, URETEROSCOPY AND STENT PLACEMENT Left 10/26/2021   Procedure: CYSTOSCOPY WITH RETROGRADE PYELOGRAM, URETEROSCOPY, STONE EXTRACTION  AND STENT PLACEMENT;  Surgeon: Franchot Gallo, MD;  [facility];  Service: Urology;  Laterality: Left;   CYSTOSCOPY/RETROGRADE/URETEROSCOPY/STONE EXTRACTION WITH BASKET Bilateral 08/02/2015   Procedure: CYSTOSCOPY; BILATERAL RETROGRADE PYELOGRAMS; BILATERAL URETEROSCOPY, STONE EXTRACTION WITH BASKET;  Surgeon: Franchot Gallo, MD;  [facility];  Service: Urology;  Laterality: Bilateral;   CYSTOSCOPY/RETROGRADE/URETEROSCOPY/STONE EXTRACTION WITH BASKET Right 06/28/2015   Procedure: CYSTOSCOPY/RETROGRADE/URETEROSCOPY/STONE EXTRACTION WITH BASKET, RIGHT URETERAL DOUBLE J STENT PLACEMENT;  Surgeon: Franchot Gallo, MD;  Location: [facility];  Service: Urology;  Laterality: Right;   ESOPHAGOGASTRODUODENOSCOPY (EGD) WITH PROPOFOL N/A 03/17/2013   Procedure: ESOPHAGOGASTRODUODENOSCOPY (EGD) WITH PROPOFOL;  Surgeon: Danie Binder, MD;  Location: [facility];  Service: Endoscopy;  Laterality: N/A;   ESOPHAGOGASTRODUODENOSCOPY (EGD) WITH PROPOFOL N/A 06/10/2015   Distal gastritis, distal esophageal stricture s/p dilation   ESOPHAGOGASTRODUODENOSCOPY (EGD) WITH PROPOFOL N/A 02/26/2017   Procedure: ESOPHAGOGASTRODUODENOSCOPY (EGD) WITH PROPOFOL;  Surgeon: Danie Binder, MD;   [facility];  Service: Endoscopy;  Laterality: N/A;   FLEXIBLE SIGMOIDOSCOPY  09/11/2011   GEX:[id number] Internal hemorrhoids   HOLMIUM LASER APPLICATION Left 40/03/2724   Procedure: HOLMIUM LASER APPLICATION;  Surgeon: Franchot Gallo, MD;  Location[facility];  Service: Urology;  Laterality: Left;   HOLMIUM LASER APPLICATION Right 36/64/4034   Procedure: HOLMIUM LASER APPLICATION;  Surgeon: Franchot Gallo, MD;  Location[facility];  Service: Urology;  Laterality: Right;   HOLMIUM LASER APPLICATION  74/25/9563   Procedure: HOLMIUM LASER APPLICATION;  Surgeon: Franchot Gallo, MD;  Location: [facility];  Service: Urology;;   HOLMIUM LASER APPLICATION Left 87/56/4332   Procedure: HOLMIUM LASER APPLICATION;  Surgeon: Franchot Gallo, MD;  Location: [facility];  Service: Urology;  Laterality: Left;   MASS EXCISION Left 03/04/2015   Procedure: EXCISION OF SOFT TISSUE NEOPLASM LEFT ARM;  Surgeon: Aviva Signs, MD;  Location: [facility];  Service: General;  Laterality: Left;   PERCUTANEOUS NEPHROSTOLITHOTOMY Bilateral 05/2010   '@[facility]'$    POLYPECTOMY N/A 03/17/2013   Procedure: GASTRIC POLYPECTOMY;  Surgeon: Danie Binder, MD;  Location: [facility];  Service: Endoscopy;  Laterality: N/A;   POLYPECTOMY  02/26/2017   Procedure: POLYPECTOMY;  Surgeon: Danie Binder, MD;  [facility];  Service: Endoscopy;;  polyp at cecum, ascending colon polyps  x3, hepatic flexure polyps x8, transverse colon polyps x8    POLYPECTOMY  09/22/2019   Procedure: POLYPECTOMY;  Surgeon: Danie Binder, MD;  [facility];  Service: Endoscopy;;   REMOVAL RIGHT THIGH CYST  2006   SAVORY DILATION N/A 03/17/2013   Procedure: SAVORY DILATION;  Surgeon: Danie Binder, MD;  Location: [facility];  Service: Endoscopy;  Laterality: N/A;  #12.8, 14, 15, 16 dilators used   SAVORY DILATION N/A 06/10/2015   Procedure: SAVORY DILATION;  Surgeon: Danie Binder, MD;  [facility];   Service: Endoscopy;  Laterality: N/A;   VAGINAL HYSTERECTOMY  1970's    Home Medications:  (Not in a hospital admission)   Allergies:  Allergies  Allergen Reactions   Ace Inhibitors Cough    Patient doesn't recall   Keflex [Cephalexin] Diarrhea and Nausea And Vomiting   Nitrofurantoin Diarrhea and Nausea And Vomiting   Penicillins Hives and Other (See Comments)    Reaction:  Blisters on hands/feet  Has patient had a PCN reaction causing immediate rash, facial/tongue/throat swelling, SOB or lightheadedness with hypotension: No Has patient had a PCN reaction causing severe rash involving mucus membranes or skin necrosis: No Has patient had a PCN reaction that required hospitalization No Has patient had a PCN reaction occurring within the last 10 years: No If all of the above answers are "NO", then may proceed with Cephalosporin use.    Family History  Problem Relation Age of Onset   Hypertension Mother    Diabetes Mother    Heart failure Mother    Dementia Mother    Emphysema Father    Hypertension Father    Diabetes Brother    GER disease Brother    Hypertension Sister    Hypertension Sister    Cancer Other        family history    Diabetes Other        family history    Heart defect Other        famiily history    Arthritis Other        family history    Anesthesia problems Neg Hx    Hypotension Neg Hx    Malignant hyperthermia Neg Hx    Pseudochol deficiency Neg Hx    Colon cancer Neg Hx     Social History:  reports that she quit smoking about 33 years ago. Her smoking use included cigarettes. She started smoking about 47 years ago. She has a 20.00 pack-year smoking history. She has never used smokeless tobacco. She reports that she does not drink alcohol and does not use drugs.  ROS: A complete review of systems was performed.  All systems are negative except for pertinent findings as noted.  Physical Exam:  Vital signs in last 24  hours: '@VSRANGES'$ @ General:  Alert and oriented, No acute distress HEENT: Normocephalic, atraumatic Neck: No JVD or lymphadenopathy Cardiovascular: Regular rate  Lungs: Normal inspiratory/expiratory excursion Extremities: No edema Neurologic: Grossly intact  I have reviewed prior pt notes  I have reviewed urinalysis results  I have independently reviewed prior imaging  Unfortunately, stent graspers were not available for use today so cystoscopy was formed   Impression/Assessment:  History of recurrent urolithiasis, status post recent stone extraction on the left plus dilation of the ureteral stricture.  She has an indwelling stent  Plan:  1.  Urine was cultured today  2.  We will reschedule her for stent extraction in 1 week  Lillette Boxer  Damaris Abeln 11/20/2021, 9:07 AM  Lillette Boxer. Radiance Deady MD

## 2021-11-21 ENCOUNTER — Ambulatory Visit (INDEPENDENT_AMBULATORY_CARE_PROVIDER_SITE_OTHER): Payer: Medicare Other | Admitting: Urology

## 2021-11-21 VITALS — BP 96/53 | HR 69

## 2021-11-21 DIAGNOSIS — N201 Calculus of ureter: Secondary | ICD-10-CM

## 2021-11-21 DIAGNOSIS — Z87442 Personal history of urinary calculi: Secondary | ICD-10-CM

## 2021-11-21 DIAGNOSIS — N132 Hydronephrosis with renal and ureteral calculous obstruction: Secondary | ICD-10-CM

## 2021-11-22 LAB — URINALYSIS, ROUTINE W REFLEX MICROSCOPIC
Bilirubin, UA: NEGATIVE
Glucose, UA: NEGATIVE
Ketones, UA: NEGATIVE
Nitrite, UA: NEGATIVE
Specific Gravity, UA: 1.025 (ref 1.005–1.030)
Urobilinogen, Ur: 0.2 mg/dL (ref 0.2–1.0)
pH, UA: 5.5 (ref 5.0–7.5)

## 2021-11-22 LAB — MICROSCOPIC EXAMINATION: RBC, Urine: >30 /hpf — AB (ref 0–2)

## 2021-11-22 LAB — URINE CULTURE: Culture: 80000 — AB

## 2021-11-23 ENCOUNTER — Telehealth: Payer: Self-pay

## 2021-11-23 ENCOUNTER — Telehealth: Payer: Self-pay | Admitting: *Deleted

## 2021-11-23 LAB — URINE CULTURE: Organism ID, Bacteria: NO GROWTH

## 2021-11-23 NOTE — Telephone Encounter (Signed)
Please see below.

## 2021-11-23 NOTE — Telephone Encounter (Signed)
Patient called wanting to know if a topical numbing gel could be prescribed to her and sent to pharmacy. She advised she is experiencing vaginal pain with the stent.     Pharmacy: [id number] [place]

## 2021-11-23 NOTE — Telephone Encounter (Signed)
Post ED Visit - Positive Culture Follow-up  Culture report reviewed by antimicrobial stewardship pharmacist: [facility] Team '[]'$  Elenor Quinones, Pharm.D. '[]'$  Heide Guile, Pharm.D., BCPS AQ-ID '[]'$  Parks Neptune, Pharm.D., BCPS '[]'$  Alycia Rossetti, Pharm.D., BCPS '[]'$  [place], [place].D., BCPS, AAHIVP '[]'$  Legrand Como, Pharm.D., BCPS, AAHIVP '[]'$  Salome Arnt, PharmD, BCPS '[]'$  Johnnette Gourd, PharmD, BCPS '[]'$  Hughes Better, PharmD, BCPS '[]'$  Leeroy Cha, PharmD '[]'$  Laqueta Linden, PharmD, BCPS '[]'$  Albertina Parr, PharmD  [place] Team '[]'$  Leodis Sias, PharmD '[]'$  Lindell Spar, PharmD '[]'$  Royetta Asal, PharmD '[]'$  Graylin Shiver, Rph '[]'$  Rema Fendt) Glennon Mac, PharmD '[]'$  Arlyn Dunning, PharmD '[]'$  Netta Cedars, PharmD '[]'$  Dia Sitter, PharmD '[]'$  Leone Haven, PharmD '[]'$  Gretta Arab, PharmD '[]'$  Theodis Shove, PharmD '[]'$  Peggyann Juba, PharmD '[]'$  Reuel Boom, PharmD   Positive urine culture Treated with Ciprofloxacin, organism sensitive to the same and no further patient follow-up is required at this time.   Luisa Hart, PharmD  Harlon Flor Talley 11/23/2021, 9:26 AM

## 2021-11-24 ENCOUNTER — Other Ambulatory Visit: Payer: Self-pay | Admitting: Urology

## 2021-11-24 DIAGNOSIS — N200 Calculus of kidney: Secondary | ICD-10-CM

## 2021-11-24 MED ORDER — OXYBUTYNIN CHLORIDE 5 MG PO TABS: ORAL_TABLET | ORAL | 1 refills | Status: DC

## 2021-11-24 NOTE — Telephone Encounter (Signed)
Pt aware of rx at pharmacy as well as negative results.

## 2021-11-26 NOTE — Progress Notes (Incomplete)
History of Present Illness:   Cheryl Abbott is a 76 y.o. year old female status post recent cystoscopy, left retrograde, ureteroscopy, extraction of left distal ureteral and left upper calyceal calculi dilation of her with stent placement.  That was performed on May 18.  Past Medical History:  Diagnosis Date   Anxiety disorder    Arthritis    Chronic back pain    Chronic pain of both shoulders    per pt told due to cervical spine pinched nerve   CKD (chronic kidney disease), stage III [facility])    nephrologist--- dr Theador Hawthorne;   due to tubular necrosis   Claustrophobia    Depression    Edema of both lower extremities    GERD (gastroesophageal reflux disease)    Headache    Hemorrhoids    w/ intermittant bleeding   History of adenomatous polyp of colon    History of kidney stones    Hx-TIA (transient ischemic attack) 02/26/2016   per pt no residual   Hydronephrosis, left    followed by urologist--- dr Diona Fanti;   persistant   Hyperlipidemia    Hypertension    Iron deficiency    followed by [facility] w/ hx iron infusions   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   oncologist--- [facility];  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, active survillence   Lactose intolerance in adult 02/01/2016   Left ureteral calculus    Lumbar disc disease with radiculopathy    Nephrolithiasis    per CT 08-11-2021  left > right   Neuropathy, peripheral    Nocturia more than twice per night    Normocytic anemia    OSA (obstructive sleep apnea)    per study 2007;   per pt no Bipap use since 2021   Renal atrophy, bilateral    Renal cyst, right    Sciatica    Sigmoid diverticulosis    Type 2 diabetes mellitus ([place])     Past Surgical History:  Procedure Laterality Date   BIOPSY N/A 03/17/2013   Procedure: GASTRIC BIOPSIES;  Surgeon: Danie Binder, MD;  [facility];  Service: Endoscopy;  Laterality: N/A;   BIOPSY  06/10/2015   Procedure: BIOPSY;  Surgeon: Danie Binder, MD;  [facility];  Service: Endoscopy;;  gastric biopsy   CARDIAC CATHETERIZATION  05/11/2003   @ [facility] by dr t. Claiborne Billings;  Abnormal cardiolite/   Normal coronary arteries and normal LVF,  ef  63%   CARDIOVASCULAR STRESS TEST  01/01/2014   normal lexiscan cardiolite/  no ischemia/ infarct/  normal LV function and wall motion ,  ef 81%   COLONOSCOPY WITH PROPOFOL N/A 02/26/2017   Procedure: COLONOSCOPY WITH PROPOFOL;  Surgeon: Danie Binder, MD;  [facility];  Service: Endoscopy;  Laterality: N/A;  11:00am   COLONOSCOPY WITH PROPOFOL N/A 09/22/2019   Procedure: COLONOSCOPY WITH PROPOFOL;  Surgeon: Danie Binder, MD;  [facility];  Service: Endoscopy;  Laterality: N/A;  1:15   CYSTO/   RIGHT URETEROSOCOPY LASER LITHOTRIPSY STONE EXTRACTION  10/13/2004   CYSTO/  RIGHT RETROGRADE PYELOGRAM/  PLACMENT RIGHT URETERAL STENT  01/10/2010   CYSTOSCOPY W/ URETERAL STENT PLACEMENT Right 08/19/2015   Procedure: CYSTOSCOPY WITH RETROGRADE PYELOGRAM/URETERAL STENT PLACEMENT;  Surgeon: Franchot Gallo, MD;  [facility];  Service: Urology;  Laterality: Right;   CYSTOSCOPY W/ URETERAL STENT PLACEMENT Left 02/23/2020   Procedure: CYSTOSCOPY LEFT  RETROGRADE PYELOGRAM LEFT URETEROSCOPY URETERAL STENT PLACEMENT;  Surgeon: Franchot Gallo, MD;  [facility];  Service: Urology;  Laterality: Left;   CYSTOSCOPY WITH RETROGRADE PYELOGRAM, URETEROSCOPY AND STENT PLACEMENT Left 10/21/2014   Procedure: CYSTOSCOPY WITH RETROGRADE PYELOGRAM, URETEROSCOPY AND STENT PLACEMENT;  Surgeon: Franchot Gallo, MD;  Location[facility];  Service: Urology;  Laterality: Left;   CYSTOSCOPY WITH RETROGRADE PYELOGRAM, URETEROSCOPY AND STENT PLACEMENT Right 11/22/2015   Procedure: CYSTOSCOPY, RIGHT URETERAL STENT REMOVAL; RIGHT RETROGRADE PYELOGRAM, RIGHT URETEROSCOPY WITH BALLOON DILATION; RIGHT URETERAL STENT PLACEMENT;  Surgeon: Franchot Gallo, MD;  [facility];  Service: Urology;  Laterality: Right;   CYSTOSCOPY WITH RETROGRADE PYELOGRAM, URETEROSCOPY AND STENT PLACEMENT Left 10/18/2020   Procedure: CYSTOSCOPY WITH LEFT RETROGRADE PYELOGRAM, LEFT URETEROSCOPY  WITH LASER AND LEFT URETERAL STENT PLACEMENT;  Surgeon: Franchot Gallo, MD;  [facility];  Service: Urology;  Laterality: Left;   CYSTOSCOPY WITH RETROGRADE PYELOGRAM, URETEROSCOPY AND STENT PLACEMENT Left 10/26/2021   Procedure: CYSTOSCOPY WITH RETROGRADE PYELOGRAM, URETEROSCOPY, STONE EXTRACTION  AND STENT PLACEMENT;  Surgeon: Franchot Gallo, MD;  Location: [facility];  Service: Urology;  Laterality: Left;   CYSTOSCOPY/RETROGRADE/URETEROSCOPY/STONE EXTRACTION WITH BASKET Bilateral 08/02/2015   Procedure: CYSTOSCOPY; BILATERAL RETROGRADE PYELOGRAMS; BILATERAL URETEROSCOPY, STONE EXTRACTION WITH BASKET;  Surgeon: Franchot Gallo, MD;  [facility];  Service: Urology;  Laterality: Bilateral;   CYSTOSCOPY/RETROGRADE/URETEROSCOPY/STONE EXTRACTION WITH BASKET Right 06/28/2015   Procedure: CYSTOSCOPY/RETROGRADE/URETEROSCOPY/STONE EXTRACTION WITH BASKET, RIGHT URETERAL DOUBLE J STENT PLACEMENT;  Surgeon: Franchot Gallo, MD;  [facility];  Service: Urology;  Laterality: Right;   ESOPHAGOGASTRODUODENOSCOPY (EGD) WITH PROPOFOL N/A 03/17/2013   Procedure: ESOPHAGOGASTRODUODENOSCOPY (EGD) WITH PROPOFOL;  Surgeon: Danie Binder, MD;  [facility];  Service: Endoscopy;  Laterality: N/A;   ESOPHAGOGASTRODUODENOSCOPY (EGD) WITH PROPOFOL N/A 06/10/2015   Distal gastritis, distal esophageal stricture s/p dilation   ESOPHAGOGASTRODUODENOSCOPY (EGD) WITH PROPOFOL N/A 02/26/2017   Procedure: ESOPHAGOGASTRODUODENOSCOPY (EGD) WITH PROPOFOL;  Surgeon: Danie Binder, MD;  [facility];  Service: Endoscopy;  Laterality: N/A;   FLEXIBLE SIGMOIDOSCOPY  09/11/2011   [id number] Internal hemorrhoids   HOLMIUM LASER APPLICATION Left  55/73/2202   Procedure: HOLMIUM LASER APPLICATION;  Surgeon: Franchot Gallo, MD;  Location: [facility];  Service: Urology;  Laterality: Left;   HOLMIUM LASER APPLICATION Right 54/27/0623   Procedure: HOLMIUM LASER APPLICATION;  Surgeon: Franchot Gallo, MD;  [facility];  Service: Urology;  Laterality: Right;   HOLMIUM LASER APPLICATION  76/28/3151   Procedure: HOLMIUM LASER APPLICATION;  Surgeon: Franchot Gallo, MD;  [facility];  Service: Urology;;   HOLMIUM LASER APPLICATION Left 76/16/0737   Procedure: HOLMIUM LASER APPLICATION;  Surgeon: Franchot Gallo, MD;  [facility];  Service: Urology;  Laterality: Left;   MASS EXCISION Left 03/04/2015   Procedure: EXCISION OF SOFT TISSUE NEOPLASM LEFT ARM;  Surgeon: Aviva Signs, MD;  [facility];  Service: General;  Laterality: Left;   PERCUTANEOUS NEPHROSTOLITHOTOMY Bilateral 05/2010   '@[facility]'$    POLYPECTOMY N/A 03/17/2013   Procedure: GASTRIC POLYPECTOMY;  Surgeon: Danie Binder, MD;  [facility];  Service: Endoscopy;  Laterality: N/A;   POLYPECTOMY  02/26/2017   Procedure: POLYPECTOMY;  Surgeon: Danie Binder, MD;  [facility];  Service: Endoscopy;;  polyp at cecum, ascending colon polyps x3, hepatic flexure polyps x8, transverse colon polyps x8    POLYPECTOMY  09/22/2019   Procedure: POLYPECTOMY;  Surgeon: Danie Binder, MD;  [facility];  Service: Endoscopy;;   REMOVAL RIGHT THIGH CYST  2006   SAVORY DILATION N/A 03/17/2013   Procedure: SAVORY DILATION;  Surgeon: Danie Binder, MD;  [facility];  Service: Endoscopy;  Laterality: N/A;  #12.8, 14, 15, 16 dilators used   SAVORY DILATION N/A 06/10/2015   Procedure: SAVORY DILATION;  Surgeon: Danie Binder, MD;  [facility];  Service: Endoscopy;  Laterality: N/A;   VAGINAL HYSTERECTOMY  1970's    Home Medications:  (Not in a hospital admission)   Allergies:  Allergies  Allergen Reactions    Ace Inhibitors Cough    Patient doesn't recall   Keflex [Cephalexin] Diarrhea and Nausea And Vomiting   Nitrofurantoin Diarrhea and Nausea And Vomiting   Penicillins Hives and Other (See Comments)    Reaction:  Blisters on hands/feet  Has patient had a PCN reaction causing immediate rash, facial/tongue/throat swelling, SOB or lightheadedness with hypotension: No Has patient had a PCN reaction causing severe rash involving mucus membranes or skin necrosis: No Has patient had a PCN reaction that required hospitalization No Has patient had a PCN reaction occurring within the last 10 years: No If all of the above answers are "NO", then may proceed with Cephalosporin use.    Family History  Problem Relation Age of Onset   Hypertension Mother    Diabetes Mother    Heart failure Mother    Dementia Mother    Emphysema Father    Hypertension Father    Diabetes Brother    GER disease Brother    Hypertension Sister    Hypertension Sister    Cancer Other        family history    Diabetes Other        family history    Heart defect Other        famiily history    Arthritis Other        family history    Anesthesia problems Neg Hx    Hypotension Neg Hx    Malignant hyperthermia Neg Hx    Pseudochol deficiency Neg Hx    Colon cancer Neg Hx     Social History:  reports that she quit smoking about 33 years ago. Her smoking use included cigarettes. She started smoking about 47 years ago. She has a 20.00 pack-year smoking history. She has never used smokeless tobacco. She reports that she does not drink alcohol and does not use drugs.  ROS: A complete review of systems was performed.  All systems are negative except for pertinent findings as noted.  Physical Exam:  Vital signs in last 24 hours: '@VSRANGES'$ @ General:  Alert and oriented, No acute distress HEENT: Normocephalic, atraumatic Neck: No JVD or lymphadenopathy Cardiovascular: Regular rate  Lungs: Normal inspiratory/expiratory  excursion Abdomen: Soft, nontender, nondistended, no abdominal masses Back: No CVA tenderness Extremities: No edema Neurologic: Grossly intact  I have reviewed prior pt notes  I have reviewed notes from referring/previous physicians  I have reviewed urinalysis results  I have independently reviewed prior imaging  I have reviewed prior urine culture   Cystoscopy Procedure Note:  Indication: ***  After informed consent and discussion of the procedure and its risks, Cheryl Abbott was positioned and prepped in the standard fashion.  Cystoscopy was performed with a flexible cystoscope.   Findings: Urethra:*** Prostate:*** Bladder neck:*** Ureteral orifices:*** Bladder:***  The patient tolerated the procedure well.    Impression/Assessment:  ***  Plan:  Cheryl Abbott Creta Dorame 11/26/2021, 9:01  PM  Cheryl Abbott. Daeshawn Redmann MD

## 2021-11-28 ENCOUNTER — Other Ambulatory Visit: Payer: Self-pay

## 2021-11-28 ENCOUNTER — Ambulatory Visit (INDEPENDENT_AMBULATORY_CARE_PROVIDER_SITE_OTHER): Payer: Medicare Other | Admitting: Urology

## 2021-11-28 VITALS — BP 122/77 | HR 104 | Ht 66.0 in | Wt 187.0 lb

## 2021-11-28 DIAGNOSIS — N200 Calculus of kidney: Secondary | ICD-10-CM

## 2021-11-28 DIAGNOSIS — N132 Hydronephrosis with renal and ureteral calculous obstruction: Secondary | ICD-10-CM

## 2021-11-28 MED ORDER — PAROXETINE HCL 40 MG PO TABS: 40.0000 mg | ORAL_TABLET | Freq: Every morning | ORAL | 1 refills | Status: DC

## 2021-11-28 MED ORDER — CIPROFLOXACIN HCL 500 MG PO TABS
500.0000 mg | ORAL_TABLET | Freq: Once | ORAL | Status: AC
  Administered 2021-11-28: 500 mg via ORAL

## 2021-11-28 NOTE — Progress Notes (Signed)
History of Present Illness: Cheryl Abbott is a 76 y.o. year old female status post recent cystoscopy, left retrograde, ureteroscopy, extraction of left distal ureteral and left upper calyceal calculi dilation of her with stent placement.  That was performed on May 18.  Past Medical History:  Diagnosis Date   Anxiety disorder    Arthritis    Chronic back pain    Chronic pain of both shoulders    per pt told due to cervical spine pinched nerve   CKD (chronic kidney disease), stage III [facility])    nephrologist--- dr Theador Hawthorne;   due to tubular necrosis   Claustrophobia    Depression    Edema of both lower extremities    GERD (gastroesophageal reflux disease)    Headache    Hemorrhoids    w/ intermittant bleeding   History of adenomatous polyp of colon    History of kidney stones    Hx-TIA (transient ischemic attack) 02/26/2016   per pt no residual   Hydronephrosis, left    followed by urologist--- dr Diona Fanti;   persistant   Hyperlipidemia    Hypertension    Iron deficiency    followed by [facility] w/ hx iron infusions   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   oncologist--- [facility];  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, active survillence   Lactose intolerance in adult 02/01/2016   Left ureteral calculus    Lumbar disc disease with radiculopathy    Nephrolithiasis    per CT 08-11-2021  left > right   Neuropathy, peripheral    Nocturia more than twice per night    Normocytic anemia    OSA (obstructive sleep apnea)    per study 2007;   per pt no Bipap use since 2021   Renal atrophy, bilateral    Renal cyst, right    Sciatica    Sigmoid diverticulosis    Type 2 diabetes mellitus ([place])     Past Surgical History:  Procedure Laterality Date   BIOPSY N/A 03/17/2013   Procedure: GASTRIC BIOPSIES;  Surgeon: Danie Binder, MD;  [facility];  Service: Endoscopy;  Laterality: N/A;   BIOPSY  06/10/2015   Procedure: BIOPSY;  Surgeon: Danie Binder, MD;  [facility];  Service: Endoscopy;;  gastric biopsy   CARDIAC CATHETERIZATION  05/11/2003   @ [facility] by dr t. Claiborne Billings;  Abnormal cardiolite/   Normal coronary arteries and normal LVF,  ef  63%   CARDIOVASCULAR STRESS TEST  01/01/2014   normal lexiscan cardiolite/  no ischemia/ infarct/  normal LV function and wall motion ,  ef 81%   COLONOSCOPY WITH PROPOFOL N/A 02/26/2017   Procedure: COLONOSCOPY WITH PROPOFOL;  Surgeon: Danie Binder, MD;  [facility];  Service: Endoscopy;  Laterality: N/A;  11:00am   COLONOSCOPY WITH PROPOFOL N/A 09/22/2019   Procedure: COLONOSCOPY WITH PROPOFOL;  Surgeon: Danie Binder, MD;  [facility];  Service: Endoscopy;  Laterality: N/A;  1:15   CYSTO/   RIGHT URETEROSOCOPY LASER LITHOTRIPSY STONE EXTRACTION  10/13/2004   CYSTO/  RIGHT RETROGRADE PYELOGRAM/  PLACMENT RIGHT URETERAL STENT  01/10/2010   CYSTOSCOPY W/ URETERAL STENT PLACEMENT Right 08/19/2015   Procedure: CYSTOSCOPY WITH RETROGRADE PYELOGRAM/URETERAL STENT PLACEMENT;  Surgeon: Franchot Gallo, MD;  [facility];  Service: Urology;  Laterality: Right;   CYSTOSCOPY W/ URETERAL STENT PLACEMENT Left 02/23/2020   Procedure: CYSTOSCOPY LEFT  RETROGRADE  PYELOGRAM LEFT URETEROSCOPY URETERAL STENT PLACEMENT;  Surgeon: Franchot Gallo, MD;  [facility];  Service: Urology;  Laterality: Left;   CYSTOSCOPY WITH RETROGRADE PYELOGRAM, URETEROSCOPY AND STENT PLACEMENT Left 10/21/2014   Procedure: CYSTOSCOPY WITH RETROGRADE PYELOGRAM, URETEROSCOPY AND STENT PLACEMENT;  Surgeon: Franchot Gallo, MD;  Location: [facility];  Service: Urology;  Laterality: Left;   CYSTOSCOPY WITH RETROGRADE PYELOGRAM, URETEROSCOPY AND STENT PLACEMENT Right 11/22/2015   Procedure: CYSTOSCOPY, RIGHT URETERAL STENT REMOVAL; RIGHT RETROGRADE PYELOGRAM, RIGHT URETEROSCOPY WITH BALLOON DILATION; RIGHT URETERAL STENT PLACEMENT;  Surgeon: Franchot Gallo, MD;  [facility];  Service: Urology;  Laterality: Right;   CYSTOSCOPY WITH RETROGRADE PYELOGRAM, URETEROSCOPY AND STENT PLACEMENT Left 10/18/2020   Procedure: CYSTOSCOPY WITH LEFT RETROGRADE PYELOGRAM, LEFT URETEROSCOPY  WITH LASER AND LEFT URETERAL STENT PLACEMENT;  Surgeon: Franchot Gallo, MD;  [facility];  Service: Urology;  Laterality: Left;   CYSTOSCOPY WITH RETROGRADE PYELOGRAM, URETEROSCOPY AND STENT PLACEMENT Left 10/26/2021   Procedure: CYSTOSCOPY WITH RETROGRADE PYELOGRAM, URETEROSCOPY, STONE EXTRACTION  AND STENT PLACEMENT;  Surgeon: Franchot Gallo, MD;  Location: [place];  Service: Urology;  Laterality: Left;   CYSTOSCOPY/RETROGRADE/URETEROSCOPY/STONE EXTRACTION WITH BASKET Bilateral 08/02/2015   Procedure: CYSTOSCOPY; BILATERAL RETROGRADE PYELOGRAMS; BILATERAL URETEROSCOPY, STONE EXTRACTION WITH BASKET;  Surgeon: Franchot Gallo, MD;  [facility];  Service: Urology;  Laterality: Bilateral;   CYSTOSCOPY/RETROGRADE/URETEROSCOPY/STONE EXTRACTION WITH BASKET Right 06/28/2015   Procedure: CYSTOSCOPY/RETROGRADE/URETEROSCOPY/STONE EXTRACTION WITH BASKET, RIGHT URETERAL DOUBLE J STENT PLACEMENT;  Surgeon: Franchot Gallo, MD;  [facility];  Service: Urology;  Laterality: Right;   ESOPHAGOGASTRODUODENOSCOPY (EGD) WITH PROPOFOL N/A 03/17/2013   Procedure: ESOPHAGOGASTRODUODENOSCOPY (EGD) WITH PROPOFOL;  Surgeon: Danie Binder, MD;  [facility];  Service: Endoscopy;  Laterality: N/A;   ESOPHAGOGASTRODUODENOSCOPY (EGD) WITH PROPOFOL N/A 06/10/2015   Distal gastritis, distal esophageal stricture s/p dilation   ESOPHAGOGASTRODUODENOSCOPY (EGD) WITH PROPOFOL N/A 02/26/2017   Procedure: ESOPHAGOGASTRODUODENOSCOPY (EGD) WITH PROPOFOL;  Surgeon: Danie Binder, MD;  [facility];  Service: Endoscopy;  Laterality: N/A;   FLEXIBLE SIGMOIDOSCOPY  09/11/2011   [id number] Internal hemorrhoids   HOLMIUM LASER APPLICATION Left  12/09/1599   Procedure: HOLMIUM LASER APPLICATION;  Surgeon: Franchot Gallo, MD;  Location: [facility];  Service: Urology;  Laterality: Left;   HOLMIUM LASER APPLICATION Right 09/32/3557   Procedure: HOLMIUM LASER APPLICATION;  Surgeon: Franchot Gallo, MD;  [facility];  Service: Urology;  Laterality: Right;   HOLMIUM LASER APPLICATION  32/20/2542   Procedure: HOLMIUM LASER APPLICATION;  Surgeon: Franchot Gallo, MD;  [facility];  Service: Urology;;   HOLMIUM LASER APPLICATION Left 70/62/3762   Procedure: HOLMIUM LASER APPLICATION;  Surgeon: Franchot Gallo, MD;  [facility];  Service: Urology;  Laterality: Left;   MASS EXCISION Left 03/04/2015   Procedure: EXCISION OF SOFT TISSUE NEOPLASM LEFT ARM;  Surgeon: Aviva Signs, MD;  [facility];  Service: General;  Laterality: Left;   PERCUTANEOUS NEPHROSTOLITHOTOMY Bilateral 05/2010   '@[facility]'$    POLYPECTOMY N/A 03/17/2013   Procedure: GASTRIC POLYPECTOMY;  Surgeon: Danie Binder, MD;  [facility];  Service: Endoscopy;  Laterality: N/A;   POLYPECTOMY  02/26/2017   Procedure: POLYPECTOMY;  Surgeon: Danie Binder, MD;  [facility];  Service: Endoscopy;;  polyp at cecum, ascending colon polyps x3, hepatic flexure polyps x8, transverse colon polyps x8    POLYPECTOMY  09/22/2019   Procedure: POLYPECTOMY;  Surgeon: Danie Binder, MD;  [facility];  Service:  Endoscopy;;   REMOVAL RIGHT THIGH CYST  2006   SAVORY DILATION N/A 03/17/2013   Procedure: SAVORY DILATION;  Surgeon: Danie Binder, MD;  [facility];  Service: Endoscopy;  Laterality: N/A;  #12.8, 14, 15, 16 dilators used   SAVORY DILATION N/A 06/10/2015   Procedure: SAVORY DILATION;  Surgeon: Danie Binder, MD;  [facility];  Service: Endoscopy;  Laterality: N/A;   VAGINAL HYSTERECTOMY  1970's    Home Medications:  (Not in a hospital admission)   Allergies:  Allergies  Allergen Reactions    Ace Inhibitors Cough    Patient doesn't recall   Keflex [Cephalexin] Diarrhea and Nausea And Vomiting   Nitrofurantoin Diarrhea and Nausea And Vomiting   Penicillins Hives and Other (See Comments)    Reaction:  Blisters on hands/feet  Has patient had a PCN reaction causing immediate rash, facial/tongue/throat swelling, SOB or lightheadedness with hypotension: No Has patient had a PCN reaction causing severe rash involving mucus membranes or skin necrosis: No Has patient had a PCN reaction that required hospitalization No Has patient had a PCN reaction occurring within the last 10 years: No If all of the above answers are "NO", then may proceed with Cephalosporin use.    Family History  Problem Relation Age of Onset   Hypertension Mother    Diabetes Mother    Heart failure Mother    Dementia Mother    Emphysema Father    Hypertension Father    Diabetes Brother    GER disease Brother    Hypertension Sister    Hypertension Sister    Cancer Other        family history    Diabetes Other        family history    Heart defect Other        famiily history    Arthritis Other        family history    Anesthesia problems Neg Hx    Hypotension Neg Hx    Malignant hyperthermia Neg Hx    Pseudochol deficiency Neg Hx    Colon cancer Neg Hx     Social History:  reports that she quit smoking about 33 years ago. Her smoking use included cigarettes. She started smoking about 47 years ago. She has a 20.00 pack-year smoking history. She has never used smokeless tobacco. She reports that she does not drink alcohol and does not use drugs.  ROS: A complete review of systems was performed.  All systems are negative except for pertinent findings as noted.  Physical Exam:  Vital signs in last 24 hours: '@VSRANGES'$ @ General:  Alert and oriented, No acute distress HEENT: Normocephalic, atraumatic Neck: No JVD or lymphadenopathy Cardiovascular: Regular rate  Lungs: Normal inspiratory/expiratory  excursion  Extremities: No edema Neurologic: Grossly intact  I have reviewed prior pt notes  I have reviewed urinalysis results  I have independently reviewed prior imaging  I have reviewed prior urine culture   Cystoscopy Procedure Note:  Indication: Removal of stent  After informed consent and discussion of the procedure and its risks, Cheryl Abbott was positioned and prepped in the standard fashion.  Cystoscopy was performed with a flexible cystoscope.   Findings: Urethra: Normal Ureteral orifices: Normally located Bladder: Normal urothelium.  Stent-left ureteral orifice was grasped and extracted without difficulty  The patient tolerated the procedure well.    Impression/Assessment:  History of urolithiasis with recent ureteroscopy for remaining impacted left distal ureteral stone that had associated  stricture and hydronephrosis  Plan:  I will have her come back in 6 weeks following renal sound for check  Jorja Loa 11/28/2021, 8:28 AM  Lillette Boxer. Hudson Majkowski MD

## 2021-11-29 ENCOUNTER — Ambulatory Visit (HOSPITAL_COMMUNITY)
Admission: RE | Admit: 2021-11-29 | Discharge: 2021-11-29 | Disposition: A | Discharge status: Home / Self Care | Payer: Medicare Other | Source: Ambulatory Visit | Attending: Physician Assistant | Admitting: Physician Assistant

## 2021-11-29 ENCOUNTER — Inpatient Hospital Stay (HOSPITAL_COMMUNITY): Payer: Medicare Other | Attending: Hematology

## 2021-11-29 DIAGNOSIS — Z87891 Personal history of nicotine dependence: Secondary | ICD-10-CM | POA: Diagnosis not present

## 2021-11-29 DIAGNOSIS — D472 Monoclonal gammopathy: Secondary | ICD-10-CM | POA: Insufficient documentation

## 2021-11-29 DIAGNOSIS — I129 Hypertensive chronic kidney disease with stage 1 through stage 4 chronic kidney disease, or unspecified chronic kidney disease: Secondary | ICD-10-CM | POA: Insufficient documentation

## 2021-11-29 DIAGNOSIS — N184 Chronic kidney disease, stage 4 (severe): Secondary | ICD-10-CM | POA: Diagnosis not present

## 2021-11-29 DIAGNOSIS — D509 Iron deficiency anemia, unspecified: Secondary | ICD-10-CM | POA: Diagnosis not present

## 2021-11-29 DIAGNOSIS — C9000 Multiple myeloma not having achieved remission: Secondary | ICD-10-CM | POA: Diagnosis not present

## 2021-11-29 LAB — COMPREHENSIVE METABOLIC PANEL
ALT: 16 U/L (ref 0–44)
AST: 19 U/L (ref 15–41)
Albumin: 4.1 g/dL (ref 3.5–5.0)
Alkaline Phosphatase: 74 U/L (ref 38–126)
Anion gap: 8 (ref 5–15)
BUN: 22 mg/dL (ref 8–23)
CO2: 23 mmol/L (ref 22–32)
Calcium: 9.9 mg/dL (ref 8.9–10.3)
Chloride: 103 mmol/L (ref 98–111)
Creatinine, Ser: 1.87 mg/dL — ABNORMAL HIGH (ref 0.44–1.00)
GFR, Estimated: 27 mL/min — ABNORMAL LOW (ref >60)
Glucose, Bld: 107 mg/dL — ABNORMAL HIGH (ref 70–99)
Potassium: 3.7 mmol/L (ref 3.5–5.1)
Sodium: 134 mmol/L — ABNORMAL LOW (ref 135–145)
Total Bilirubin: 0.4 mg/dL (ref 0.3–1.2)
Total Protein: 8.5 g/dL — ABNORMAL HIGH (ref 6.5–8.1)

## 2021-11-29 LAB — CBC WITH DIFFERENTIAL/PLATELET
Abs Immature Granulocytes: 0.06 10*3/uL (ref 0.00–0.07)
Basophils Absolute: 0 10*3/uL (ref 0.0–0.1)
Basophils Relative: 0 %
Eosinophils Absolute: 0.1 10*3/uL (ref 0.0–0.5)
Eosinophils Relative: 1 %
HCT: 40.2 % (ref 36.0–46.0)
Hemoglobin: 13.2 g/dL (ref 12.0–15.0)
Immature Granulocytes: 1 %
Lymphocytes Relative: 12 %
Lymphs Abs: 1.3 10*3/uL (ref 0.7–4.0)
MCH: 32 pg (ref 26.0–34.0)
MCHC: 32.8 g/dL (ref 30.0–36.0)
MCV: 97.6 fL (ref 80.0–100.0)
Monocytes Absolute: 1.1 10*3/uL — ABNORMAL HIGH (ref 0.1–1.0)
Monocytes Relative: 10 %
Neutro Abs: 8.6 10*3/uL — ABNORMAL HIGH (ref 1.7–7.7)
Neutrophils Relative %: 76 %
Platelets: 282 10*3/uL (ref 150–400)
RBC: 4.12 MIL/uL (ref 3.87–5.11)
RDW: 14.8 % (ref 11.5–15.5)
WBC: 11.3 10*3/uL — ABNORMAL HIGH (ref 4.0–10.5)
nRBC: 0 % (ref 0.0–0.2)

## 2021-11-29 LAB — IRON AND TIBC
Iron: 38 ug/dL (ref 28–170)
Saturation Ratios: 10 % — ABNORMAL LOW (ref 10.4–31.8)
TIBC: 384 ug/dL (ref 250–450)
UIBC: 346 ug/dL

## 2021-11-29 LAB — LACTATE DEHYDROGENASE: LDH: 123 U/L (ref 98–192)

## 2021-11-29 LAB — FERRITIN: Ferritin: 39 ng/mL (ref 11–307)

## 2021-11-30 ENCOUNTER — Other Ambulatory Visit: Payer: Self-pay | Admitting: Family Medicine

## 2021-11-30 LAB — KAPPA/LAMBDA LIGHT CHAINS
Kappa free light chain: 48 mg/L — ABNORMAL HIGH (ref 3.3–19.4)
Kappa, lambda light chain ratio: 3.18 — ABNORMAL HIGH (ref 0.26–1.65)
Lambda free light chains: 15.1 mg/L (ref 5.7–26.3)

## 2021-12-01 LAB — PROTEIN ELECTROPHORESIS, SERUM
A/G Ratio: 0.9 (ref 0.7–1.7)
Albumin ELP: 3.7 g/dL (ref 2.9–4.4)
Alpha-1-Globulin: 0.2 g/dL (ref 0.0–0.4)
Alpha-2-Globulin: 1.1 g/dL — ABNORMAL HIGH (ref 0.4–1.0)
Beta Globulin: 1.1 g/dL (ref 0.7–1.3)
Gamma Globulin: 1.8 g/dL (ref 0.4–1.8)
Globulin, Total: 4.1 g/dL — ABNORMAL HIGH (ref 2.2–3.9)
M-Spike, %: 1.3 g/dL — ABNORMAL HIGH
Total Protein ELP: 7.8 g/dL (ref 6.0–8.5)

## 2021-12-05 ENCOUNTER — Ambulatory Visit (HOSPITAL_COMMUNITY)
Admission: RE | Admit: 2021-12-05 | Discharge: 2021-12-05 | Disposition: A | Discharge status: Home / Self Care | Payer: Medicare Other | Source: Ambulatory Visit | Attending: Urology | Admitting: Urology

## 2021-12-05 DIAGNOSIS — N201 Calculus of ureter: Secondary | ICD-10-CM | POA: Diagnosis not present

## 2021-12-05 DIAGNOSIS — N1330 Unspecified hydronephrosis: Secondary | ICD-10-CM | POA: Diagnosis not present

## 2021-12-05 DIAGNOSIS — N132 Hydronephrosis with renal and ureteral calculous obstruction: Secondary | ICD-10-CM | POA: Diagnosis not present

## 2021-12-05 NOTE — Progress Notes (Signed)
[place], [place] [address]   CLINIC:  Medical Oncology/Hematology  PCP:  [name], MD [address], [address] [place] [address] [phone]   REASON FOR VISIT:  Follow-up for IgG kappa smoldering myeloma & iron deficiency anemia   CURRENT THERAPY: Intermittent IV iron infusions (last in March 2023)   INTERVAL HISTORY:  [name] 76 y.o. female returns for routine follow-up of her IgG kappa smoldering myeloma and her iron deficiency anemia.  She was last seen by Tarri Abernethy PA-C on 08/23/2021.  At today's visit, she reports feeling at her usual baseline.  No recent hospitalizations, surgeries, or changes in baseline health status.  Regarding her smoldering myeloma, she has not noted any new symptoms. She continues to have pain in her left shoulder which she says she has seen neurosurgeon for and is related to a pinched nerve in her neck. She denies any new bone pain or fractures.  Her night sweats have resolved - she denies any fever, chills, or unintentional weight loss.  She admits to tinnitus as well as numbness in her feet.   No new onset hearing loss, blurred vision, headache, or dizziness.  No thromboembolic events.  No new masses or lymphadenopathy.  Regarding her iron deficiency anemia, she reports that she felt slightly better after her IV iron in March 2023.  She did have gross hematuria for about 36 hours following ureteral stent removal earlier this month. She denies any recent rectal bleeding, but has had scant hemorrhoidal bleeding in the past. She denies any  hematemesis, melena, or epistaxis.  She is symptomatic with ongoing fatigue that was slightly improved by last IV iron.  She has symptoms of restless legs and dyspnea on exertion.  No pica, chest pain, lightheadedness, or syncope.  She has 40% energy and 50% appetite.  She endorses that she is maintaining a stable weight.   REVIEW OF SYSTEMS:   Review  of Systems  Constitutional:  Positive for fatigue. Negative for appetite change, chills, diaphoresis, fever and unexpected weight change.  HENT:   Positive for trouble swallowing. Negative for lump/mass and nosebleeds.   Eyes:  Negative for eye problems.  Respiratory:  Positive for shortness of breath. Negative for cough and hemoptysis.   Cardiovascular:  Positive for chest pain (last week, none today). Negative for leg swelling and palpitations.  Gastrointestinal:  Positive for constipation, nausea and vomiting. Negative for abdominal pain, blood in stool and diarrhea.  Genitourinary:  Positive for frequency. Negative for hematuria.   Musculoskeletal:  Positive for arthralgias.  Skin: Negative.   Neurological:  Negative for dizziness, headaches and light-headedness.  Hematological:  Does not bruise/bleed easily.  Psychiatric/Behavioral:  Positive for sleep disturbance. The patient is nervous/anxious (at times).       PAST MEDICAL/SURGICAL HISTORY:  Past Medical History:  Diagnosis Date   Anxiety disorder    Arthritis    Chronic back pain    Chronic pain of both shoulders    per pt told due to cervical spine pinched nerve   CKD (chronic kidney disease), stage III [facility])    nephrologist--- dr Theador Hawthorne;   due to tubular necrosis   Claustrophobia    Depression    Edema of both lower extremities    GERD (gastroesophageal reflux disease)    Headache    Hemorrhoids    w/ intermittant bleeding   History of adenomatous polyp of colon    History of kidney stones  Hx-TIA (transient ischemic attack) 02/26/2016   per pt no residual   Hydronephrosis, left    followed by urologist--- dr Diona Fanti;   persistant   Hyperlipidemia    Hypertension    Iron deficiency    followed by [facility] w/ hx iron infusions   Kappa light chain myeloma ([place]) 02/2017   oncologist--- [facility];  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, active survillence   Lactose intolerance in adult  02/01/2016   Left ureteral calculus    Lumbar disc disease with radiculopathy    Nephrolithiasis    per CT 08-11-2021  left > right   Neuropathy, peripheral    Nocturia more than twice per night    Normocytic anemia    OSA (obstructive sleep apnea)    per study 2007;   per pt no Bipap use since 2021   Renal atrophy, bilateral    Renal cyst, right    Sciatica    Sigmoid diverticulosis    Type 2 diabetes mellitus ([place])    Past Surgical History:  Procedure Laterality Date   BIOPSY N/A 03/17/2013   Procedure: GASTRIC BIOPSIES;  Surgeon: Danie Binder, MD;  [facility]: [facility];  Service: Endoscopy;  Laterality: N/A;   BIOPSY  06/10/2015   Procedure: BIOPSY;  Surgeon: Danie Binder, MD;  [facility];  Service: Endoscopy;;  gastric biopsy   CARDIAC CATHETERIZATION  05/11/2003   @ [facility] by dr t. Claiborne Billings;  Abnormal cardiolite/   Normal coronary arteries and normal LVF,  ef  63%   CARDIOVASCULAR STRESS TEST  01/01/2014   normal lexiscan cardiolite/  no ischemia/ infarct/  normal LV function and wall motion ,  ef 81%   COLONOSCOPY WITH PROPOFOL N/A 02/26/2017   Procedure: COLONOSCOPY WITH PROPOFOL;  Surgeon: Danie Binder, MD;  [facility];  Service: Endoscopy;  Laterality: N/A;  11:00am   COLONOSCOPY WITH PROPOFOL N/A 09/22/2019   Procedure: COLONOSCOPY WITH PROPOFOL;  Surgeon: Danie Binder, MD;  [facility];  Service: Endoscopy;  Laterality: N/A;  1:15   CYSTO/   RIGHT URETEROSOCOPY LASER LITHOTRIPSY STONE EXTRACTION  10/13/2004   CYSTO/  RIGHT RETROGRADE PYELOGRAM/  PLACMENT RIGHT URETERAL STENT  01/10/2010   CYSTOSCOPY W/ URETERAL STENT PLACEMENT Right 08/19/2015   Procedure: CYSTOSCOPY WITH RETROGRADE PYELOGRAM/URETERAL STENT PLACEMENT;  Surgeon: Franchot Gallo, MD;  [facility];  Service: Urology;  Laterality: Right;   CYSTOSCOPY W/ URETERAL STENT PLACEMENT Left 02/23/2020   Procedure: CYSTOSCOPY LEFT  RETROGRADE  PYELOGRAM LEFT URETEROSCOPY URETERAL STENT PLACEMENT;  Surgeon: Franchot Gallo, MD;  [facility];  Service: Urology;  Laterality: Left;   CYSTOSCOPY WITH RETROGRADE PYELOGRAM, URETEROSCOPY AND STENT PLACEMENT Left 10/21/2014   Procedure: CYSTOSCOPY WITH RETROGRADE PYELOGRAM, URETEROSCOPY AND STENT PLACEMENT;  Surgeon: Franchot Gallo, MD;  Location: [facility];  Service: Urology;  Laterality: Left;   CYSTOSCOPY WITH RETROGRADE PYELOGRAM, URETEROSCOPY AND STENT PLACEMENT Right 11/22/2015   Procedure: CYSTOSCOPY, RIGHT URETERAL STENT REMOVAL; RIGHT RETROGRADE PYELOGRAM, RIGHT URETEROSCOPY WITH BALLOON DILATION; RIGHT URETERAL STENT PLACEMENT;  Surgeon: Franchot Gallo, MD;  [facility];  Service: Urology;  Laterality: Right;   CYSTOSCOPY WITH RETROGRADE PYELOGRAM, URETEROSCOPY AND STENT PLACEMENT Left 10/18/2020   Procedure: CYSTOSCOPY WITH LEFT RETROGRADE PYELOGRAM, LEFT URETEROSCOPY  WITH LASER AND LEFT URETERAL STENT PLACEMENT;  Surgeon: Franchot Gallo, MD;  [facility];  Service: Urology;  Laterality: Left;   CYSTOSCOPY WITH RETROGRADE PYELOGRAM, URETEROSCOPY  AND STENT PLACEMENT Left 10/26/2021   Procedure: CYSTOSCOPY WITH RETROGRADE PYELOGRAM, URETEROSCOPY, STONE EXTRACTION  AND STENT PLACEMENT;  Surgeon: Franchot Gallo, MD;  Location: [facility];  Service: Urology;  Laterality: Left;   CYSTOSCOPY/RETROGRADE/URETEROSCOPY/STONE EXTRACTION WITH BASKET Bilateral 08/02/2015   Procedure: CYSTOSCOPY; BILATERAL RETROGRADE PYELOGRAMS; BILATERAL URETEROSCOPY, STONE EXTRACTION WITH BASKET;  Surgeon: Franchot Gallo, MD;  [facility];  Service: Urology;  Laterality: Bilateral;   CYSTOSCOPY/RETROGRADE/URETEROSCOPY/STONE EXTRACTION WITH BASKET Right 06/28/2015   Procedure: CYSTOSCOPY/RETROGRADE/URETEROSCOPY/STONE EXTRACTION WITH BASKET, RIGHT URETERAL DOUBLE J STENT PLACEMENT;  Surgeon: Franchot Gallo, MD;  [facility];  Service: Urology;   Laterality: Right;   ESOPHAGOGASTRODUODENOSCOPY (EGD) WITH PROPOFOL N/A 03/17/2013   Procedure: ESOPHAGOGASTRODUODENOSCOPY (EGD) WITH PROPOFOL;  Surgeon: Danie Binder, MD;  [facility];  Service: Endoscopy;  Laterality: N/A;   ESOPHAGOGASTRODUODENOSCOPY (EGD) WITH PROPOFOL N/A 06/10/2015   Distal gastritis, distal esophageal stricture s/p dilation   ESOPHAGOGASTRODUODENOSCOPY (EGD) WITH PROPOFOL N/A 02/26/2017   Procedure: ESOPHAGOGASTRODUODENOSCOPY (EGD) WITH PROPOFOL;  Surgeon: Danie Binder, MD;  [facility];  Service: Endoscopy;  Laterality: N/A;   FLEXIBLE SIGMOIDOSCOPY  09/11/2011   XNT:[id number] Internal hemorrhoids   HOLMIUM LASER APPLICATION Left 49/67/5916   Procedure: HOLMIUM LASER APPLICATION;  Surgeon: Franchot Gallo, MD;  Location: [facility];  Service: Urology;  Laterality: Left;   HOLMIUM LASER APPLICATION Right 38/46/6599   Procedure: HOLMIUM LASER APPLICATION;  Surgeon: Franchot Gallo, MD;  [facility];  Service: Urology;  Laterality: Right;   HOLMIUM LASER APPLICATION  35/70/1779   Procedure: HOLMIUM LASER APPLICATION;  Surgeon: Franchot Gallo, MD;  [facility];  Service: Urology;;   HOLMIUM LASER APPLICATION Left 39/08/90   Procedure: HOLMIUM LASER APPLICATION;  Surgeon: Franchot Gallo, MD;  [facility];  Service: Urology;  Laterality: Left;   MASS EXCISION Left 03/04/2015   Procedure: EXCISION OF SOFT TISSUE NEOPLASM LEFT ARM;  Surgeon: Aviva Signs, MD;  Location[facility];  Service: General;  Laterality: Left;   PERCUTANEOUS NEPHROSTOLITHOTOMY Bilateral 05/2010   @[facility]    POLYPECTOMY N/A 03/17/2013   Procedure: GASTRIC POLYPECTOMY;  Surgeon: Danie Binder, MD;  [facility];  Service: Endoscopy;  Laterality: N/A;   POLYPECTOMY  02/26/2017   Procedure: POLYPECTOMY;  Surgeon: Danie Binder, MD;  [facility];  Service: Endoscopy;;  polyp at cecum, ascending colon polyps x3, hepatic flexure  polyps x8, transverse colon polyps x8    POLYPECTOMY  09/22/2019   Procedure: POLYPECTOMY;  Surgeon: Danie Binder, MD;  [facility];  Service: Endoscopy;;   REMOVAL RIGHT THIGH CYST  2006   SAVORY DILATION N/A 03/17/2013   Procedure: SAVORY DILATION;  Surgeon: Danie Binder, MD;  [facility];  Service: Endoscopy;  Laterality: N/A;  #12.8, 14, 15, 16 dilators used   SAVORY DILATION N/A 06/10/2015   Procedure: SAVORY DILATION;  Surgeon: Danie Binder, MD;  [facility];  Service: Endoscopy;  Laterality: N/A;   VAGINAL HYSTERECTOMY  1970's     SOCIAL HISTORY:  Social History   Socioeconomic History   Marital status: Divorced    Spouse name: Not on file   Number of children: 2   Years of education: Not on file   Highest education level: Not on file  Occupational History   Occupation: retired   Tobacco Use   Smoking status: Former    Packs/day: 1.00    Years: 20.00    Total pack years: 20.00  Types: Cigarettes    Start date: 09/21/1974    Quit date: 09/20/1988    Years since quitting: 33.2   Smokeless tobacco: Never  Vaping Use   Vaping Use: Never used  Substance and Sexual Activity   Alcohol use: No    Alcohol/week: 0.0 standard drinks of alcohol   Drug use: No   Sexual activity: Never    Birth control/protection: Surgical  Other Topics Concern   Not on file  Social History Narrative   Patient is right handed   Patient drinks some caffeine daily.   Social Determinants of Health   Financial Resource Strain: Low Risk  (04/17/2021)   Overall Financial Resource Strain (CARDIA)    Difficulty of Paying Living Expenses: Not hard at all  Food Insecurity: No Food Insecurity (04/17/2021)   Hunger Vital Sign    Worried About Running Out of Food in the Last Year: Never true    Ran Out of Food in the Last Year: Never true  Transportation Needs: No Transportation Needs (04/17/2021)   PRAPARE - Hydrologist (Medical):  No    Lack of Transportation (Non-Medical): No  Physical Activity: Inactive (04/17/2021)   Exercise Vital Sign    Days of Exercise per Week: 0 days    Minutes of Exercise per Session: 0 min  Stress: No Stress Concern Present (04/17/2021)   [place]    Feeling of Stress : Not at all  Social Connections: Moderately Isolated (04/17/2021)   Social Connection and Isolation Panel [NHANES]    Frequency of Communication with Friends and Family: More than three times a week    Frequency of Social Gatherings with Friends and Family: Never    Attends Religious Services: More than 4 times per year    Active Member of [employer] or Organizations: No    Attends Archivist Meetings: Never    Marital Status: Divorced  Human resources officer Violence: Not At Risk (04/17/2021)   Humiliation, Afraid, Rape, and Kick questionnaire    Fear of Current or Ex-Partner: No    Emotionally Abused: No    Physically Abused: No    Sexually Abused: No    FAMILY HISTORY:  Family History  Problem Relation Age of Onset   Hypertension Mother    Diabetes Mother    Heart failure Mother    Dementia Mother    Emphysema Father    Hypertension Father    Diabetes Brother    GER disease Brother    Hypertension Sister    Hypertension Sister    Cancer Other        family history    Diabetes Other        family history    Heart defect Other        famiily history    Arthritis Other        family history    Anesthesia problems Neg Hx    Hypotension Neg Hx    Malignant hyperthermia Neg Hx    Pseudochol deficiency Neg Hx    Colon cancer Neg Hx     CURRENT MEDICATIONS:  Outpatient Encounter Medications as of 12/06/2021  Medication Sig   acetaminophen (TYLENOL) 500 MG tablet Take 1,000 mg by mouth every 6 (six) hours as needed for mild pain.   amLODipine (NORVASC) 10 MG tablet TAKE 1 TABLET BY MOUTH EVERY DAY   bisacodyl (DULCOLAX) 10 MG suppository  Place 1 suppository (10 mg total)  rectally as needed for moderate constipation.   bisacodyl (DULCOLAX) 5 MG EC tablet Take 5 mg by mouth daily as needed for moderate constipation.   ciprofloxacin (CIPRO) 500 MG tablet Take 1 tablet (500 mg total) by mouth every 12 (twelve) hours.   diclofenac Sodium (VOLTAREN) 1 % GEL APPLY 4 G TOPICALLY 4 (FOUR) TIMES DAILY AS NEEDED (PAIN).   diphenhydramine-acetaminophen (TYLENOL PM) 25-500 MG TABS tablet Take 2 tablets by mouth at bedtime.   docusate sodium (COLACE) 100 MG capsule Take 1 capsule (100 mg total) by mouth 2 (two) times daily. (Patient taking differently: Take 100 mg by mouth 2 (two) times daily as needed.)   Dulaglutide (TRULICITY) 2.12 YQ/8.2NO SOPN Inject 0.75 mg into the skin daily.   Fluticasone-Umeclidin-Vilant (TRELEGY ELLIPTA) 100-62.5-25 MCG/ACT AEPB Inhale 1 puff into the lungs daily.   gabapentin (NEURONTIN) 100 MG capsule Take 100 mg by mouth daily.   gabapentin (NEURONTIN) 300 MG capsule Take 300 mg by mouth at bedtime.   linagliptin (TRADJENTA) 5 MG TABS tablet Take 1 tablet (5 mg total) by mouth daily. (Patient taking differently: Take 5 mg by mouth daily.)   nitroGLYCERIN (NITROSTAT) 0.4 MG SL tablet Place 1 tablet (0.4 mg total) under the tongue every 5 (five) minutes as needed for chest pain. Up to 3 tablets, then call 911.   OneTouch Delica Lancets 03B MISC Once daily testing dx e11.9   ONETOUCH ULTRA test strip USE AS INSTRUCTED ONCE DAILY DX E11.9   oxybutynin (DITROPAN) 5 MG tablet 1 tab po q 6 hrs prn stent discomfort   pantoprazole (PROTONIX) 40 MG tablet Take 1 tablet (40 mg total) by mouth 2 (two) times daily.   PARoxetine (PAXIL) 40 MG tablet Take 1 tablet (40 mg total) by mouth in the morning.   rosuvastatin (CRESTOR) 20 MG tablet TAKE 1 TABLET BY MOUTH EVERY DAY (Patient taking differently: Take 20 mg by mouth.)   SYMBICORT 80-4.5 MCG/ACT inhaler Inhale 2 puffs into the lungs 2 (two) times daily.   traMADol (ULTRAM) 50  MG tablet TAKE 1 TABLET BY MOUTH EVERY 6 HOURS AS NEEDED   triamcinolone cream (KENALOG) 0.1 % Apply 1 application topically 2 (two) times daily. (Patient taking differently: Apply 1 application  topically 2 (two) times daily as needed.)   UNABLE TO FIND Diabetic shoes x 1 pair, inserts x 3 pair  DX E11.9   No facility-administered encounter medications on file as of 12/06/2021.    ALLERGIES:  Allergies  Allergen Reactions   Ace Inhibitors Cough    Patient doesn't recall   Keflex [Cephalexin] Diarrhea and Nausea And Vomiting   Nitrofurantoin Diarrhea and Nausea And Vomiting   Penicillins Hives and Other (See Comments)    Reaction:  Blisters on hands/feet  Has patient had a PCN reaction causing immediate rash, facial/tongue/throat swelling, SOB or lightheadedness with hypotension: No Has patient had a PCN reaction causing severe rash involving mucus membranes or skin necrosis: No Has patient had a PCN reaction that required hospitalization No Has patient had a PCN reaction occurring within the last 10 years: No If all of the above answers are "NO", then may proceed with Cephalosporin use.     PHYSICAL EXAM:  ECOG PERFORMANCE STATUS: 2 - Symptomatic, <50% confined to bed    There were no vitals filed for this visit. There were no vitals filed for this visit. Physical Exam Constitutional:      Appearance: Normal appearance. She is obese.  HENT:     Head:  Normocephalic and atraumatic.     Mouth/Throat:     Mouth: Mucous membranes are moist.  Eyes:     Extraocular Movements: Extraocular movements intact.     Pupils: Pupils are equal, round, and reactive to light.  Cardiovascular:     Rate and Rhythm: Normal rate and regular rhythm.     Pulses: Normal pulses.     Heart sounds: Normal heart sounds.  Pulmonary:     Effort: Pulmonary effort is normal.     Breath sounds: No wheezing.  Abdominal:     General: Bowel sounds are normal.     Palpations: Abdomen is soft.      Tenderness: There is no abdominal tenderness.  Musculoskeletal:        General: No swelling.     Right lower leg: No edema.     Left lower leg: No edema.  Lymphadenopathy:     Cervical: No cervical adenopathy.  Skin:    General: Skin is warm and dry.  Neurological:     General: No focal deficit present.     Mental Status: She is alert and oriented to person, place, and time.  Psychiatric:        Mood and Affect: Mood normal.        Behavior: Behavior normal.      LABORATORY DATA:  I have reviewed the labs as listed.  CBC    Component Value Date/Time   WBC 11.3 (H) 11/29/2021 1125   RBC 4.12 11/29/2021 1125   HGB 13.2 11/29/2021 1125   HCT 40.2 11/29/2021 1125   PLT 282 11/29/2021 1125   MCV 97.6 11/29/2021 1125   MCH 32.0 11/29/2021 1125   MCHC 32.8 11/29/2021 1125   RDW 14.8 11/29/2021 1125   LYMPHSABS 1.3 11/29/2021 1125   MONOABS 1.1 (H) 11/29/2021 1125   EOSABS 0.1 11/29/2021 1125   BASOSABS 0.0 11/29/2021 1125      Latest Ref Rng & Units 11/29/2021   11:25 AM 11/19/2021    8:02 PM 10/26/2021    1:18 PM  CMP  Glucose 70 - 99 mg/dL 107  107  101   BUN 8 - 23 mg/dL 22  18  22    Creatinine 0.44 - 1.00 mg/dL 1.87  1.71  2.00   Sodium 135 - 145 mmol/L 134  138  142   Potassium 3.5 - 5.1 mmol/L 3.7  3.8  4.9   Chloride 98 - 111 mmol/L 103  109  105   CO2 22 - 32 mmol/L 23  24    Calcium 8.9 - 10.3 mg/dL 9.9  10.4    Total Protein 6.5 - 8.1 g/dL 8.5     Total Bilirubin 0.3 - 1.2 mg/dL 0.4     Alkaline Phos 38 - 126 U/L 74     AST 15 - 41 U/L 19     ALT 0 - 44 U/L 16       DIAGNOSTIC IMAGING:  I have independently reviewed the relevant imaging and discussed with the patient.  ASSESSMENT & PLAN: 1.  IgG kappa smoldering myeloma: - Diagnosed in September 2018 after nephrology work-up found M spike in urine (8.3 mg per 24 hours) and serum (1.5 g/dL) - Immunofixation shows IgG monoclonal protein with kappa light chain specificity - Bone marrow biopsy on  02/19/2017 showed trilineage hematopoiesis, plasma cells 14%, cytogenetics normal, FISH panel normal - Skeletal survey on 02/01/2020 did not show any lytic lesions. - Skeletal survey (02/16/2021): 2 adjacent 4 mm lucencies  on the right scapula at the base of the glenoid, which were not seen on prior exam.  No other focal bone lesion. - Scapula x-ray (02/21/2021): Small lucent lesions in the scapula at the base of the glenoid, better seen on skeletal survey - Skeletal survey (11/29/2021): Stable lucencies in scapula on the right, no new lytic or destructive lesion is seen. - Most recent myeloma panel (11/29/2021): M spike 1.3, within patient's baseline range.  Stable light chains with elevated kappa 48.0, normal lambda 15.1, elevated ratio 3.18.  LDH normal. - Additional labs (11/29/2021): Creatinine 1.87 (stable baseline CKD stage IV), calcium 9.9, Hgb 13.2 - No B symptoms or new bone pain.  Chronic right leg pain, chronic left shoulder pain from pinched nerve in neck - Per [facility] 20/20/2 guidelines, patient is not high risk SMM, no treatment indicated at this time, only continued close observation - PLAN: Repeat myeloma panel and office visit in 3 months   -- Next skeletal survey due in June 2024  2.  Normocytic anemia with iron deficiency: - She had a colonoscopy on 09/22/2019 which showed polyps, diverticulosis, and external and internal hemorrhoids.  EGD (02/26/2017) with small hiatal hernia and mild gastritis. - Has required intermittent IV iron infusions (unable to tolerate oral iron due to severe constipation); last IV iron with Feraheme x2 on 08/30/2021 and 09/06/2021 - Most recent labs (11/29/2021): Hgb 13.2, ferritin 39, iron saturation 10% - Symptomatic with chronic fatigue and dyspnea on exertion    - PLAN: Due to symptomatic iron deficiency with ferritin < 50 and iron saturation < 20%, recommend IV iron with Feraheme x2.  We will repeat labs in 3 months and reassess at return visit.   All  questions were answered. The patient knows to call the clinic with any problems, questions or concerns.  Medical decision making: Moderate    Time spent on visit: I spent 20 minutes counseling the patient face to face. The total time spent in the appointment was 30 minutes and more than 50% was on counseling.   Harriett Rush, PA-C   12/06/21 12:30 PM

## 2021-12-06 ENCOUNTER — Inpatient Hospital Stay (HOSPITAL_BASED_OUTPATIENT_CLINIC_OR_DEPARTMENT_OTHER): Payer: Medicare Other | Admitting: Physician Assistant

## 2021-12-06 VITALS — BP 127/74 | HR 79 | Temp 97.2°F | Resp 20 | Ht 66.93 in | Wt 185.6 lb

## 2021-12-06 DIAGNOSIS — D472 Monoclonal gammopathy: Secondary | ICD-10-CM | POA: Diagnosis not present

## 2021-12-06 DIAGNOSIS — N184 Chronic kidney disease, stage 4 (severe): Secondary | ICD-10-CM | POA: Diagnosis not present

## 2021-12-06 DIAGNOSIS — I129 Hypertensive chronic kidney disease with stage 1 through stage 4 chronic kidney disease, or unspecified chronic kidney disease: Secondary | ICD-10-CM | POA: Diagnosis not present

## 2021-12-06 DIAGNOSIS — Z87891 Personal history of nicotine dependence: Secondary | ICD-10-CM | POA: Diagnosis not present

## 2021-12-06 DIAGNOSIS — D509 Iron deficiency anemia, unspecified: Secondary | ICD-10-CM | POA: Diagnosis not present

## 2021-12-06 NOTE — Patient Instructions (Signed)
[place] at [facility] Discharge Instructions  You were seen today by Tarri Abernethy PA-C for your iron deficiency anemia and your smoldering myeloma.  IRON DEFICIENCY: Your iron levels are low today, so I recommend treatment with IV iron (Feraheme) x2 doses.  We will recheck your levels at your follow-up visit in 3 months.  SMOLDERING MYELOMA: You continue to be in the "gray area" between normal blood and full-blown blood cancer (multiple myeloma).  We will continue to monitor your labs closely, and we will see you for follow-up in 3 months with a full lab panel.  LABS: Return in 3 months for repeat labs  TREATMENT: IV Feraheme (iron infusion) x2 doses  FOLLOW-UP APPOINTMENT: Office visit in 3 months, after labs   - - - - - - - - - - - - - - - - - -    Thank you for choosing [place] at [facility] to provide your oncology and hematology care.  To afford each patient quality time with our provider, please arrive at least 15 minutes before your scheduled appointment time.   If you have a lab appointment with the [facility] please come in thru the Main Entrance and check in at the main information desk.  You need to re-schedule your appointment should you arrive 10 or more minutes late.  We strive to give you quality time with our providers, and arriving late affects you and other patients whose appointments are after yours.  Also, if you no show three or more times for appointments you may be dismissed from the clinic at the providers discretion.     Again, thank you for choosing [facility].  Our hope is that these requests will decrease the amount of time that you wait before being seen by our physicians.       _____________________________________________________________  Should you have questions after your visit to [facility], please contact our office at [phone] and follow the prompts.  Our  office hours are 8:00 a.m. and 4:30 p.m. Monday - Friday.  Please note that voicemails left after 4:00 p.m. may not be returned until the following business day.  We are closed weekends and major holidays.  You do have access to a nurse 24-7, just call the main number to the clinic [phone] and do not press any options, hold on the line and a nurse will answer the phone.    For prescription refill requests, have your pharmacy contact our office and allow 72 hours.    Due to Covid, you will need to wear a mask upon entering the hospital. If you do not have a mask, a mask will be given to you at the Main Entrance upon arrival. For doctor visits, patients may have 1 support person age 40 or older with them. For treatment visits, patients can not have anyone with them due to social distancing guidelines and our immunocompromised population.

## 2021-12-17 DIAGNOSIS — R0902 Hypoxemia: Secondary | ICD-10-CM | POA: Diagnosis not present

## 2021-12-18 ENCOUNTER — Encounter (HOSPITAL_COMMUNITY): Payer: Self-pay

## 2021-12-18 ENCOUNTER — Inpatient Hospital Stay (HOSPITAL_COMMUNITY): Payer: Medicare Other | Attending: Hematology

## 2021-12-18 VITALS — BP 120/70 | HR 82 | Temp 98.2°F | Resp 20

## 2021-12-18 DIAGNOSIS — D472 Monoclonal gammopathy: Secondary | ICD-10-CM | POA: Insufficient documentation

## 2021-12-18 DIAGNOSIS — D509 Iron deficiency anemia, unspecified: Secondary | ICD-10-CM | POA: Diagnosis not present

## 2021-12-18 DIAGNOSIS — D508 Other iron deficiency anemias: Secondary | ICD-10-CM

## 2021-12-18 MED ORDER — ACETAMINOPHEN 325 MG PO TABS
650.0000 mg | ORAL_TABLET | Freq: Once | ORAL | Status: AC
  Administered 2021-12-18: 650 mg via ORAL
  Filled 2021-12-18: qty 2

## 2021-12-18 MED ORDER — SODIUM CHLORIDE 0.9 % IV SOLN: Freq: Once | INTRAVENOUS | Status: AC

## 2021-12-18 MED ORDER — LORATADINE 10 MG PO TABS
10.0000 mg | ORAL_TABLET | Freq: Once | ORAL | Status: AC
  Administered 2021-12-18: 10 mg via ORAL
  Filled 2021-12-18: qty 1

## 2021-12-18 MED ORDER — SODIUM CHLORIDE 0.9 % IV SOLN
510.0000 mg | Freq: Once | INTRAVENOUS | Status: AC
  Administered 2021-12-18: 510 mg via INTRAVENOUS
  Filled 2021-12-18: qty 17

## 2021-12-18 NOTE — Patient Instructions (Signed)
[place]  Discharge Instructions: Thank you for choosing [place] to provide your oncology and hematology care.  If you have a lab appointment with the [place] [facility], please come in thru the Main Entrance and check in at the main information desk.  Wear comfortable clothing and clothing appropriate for easy access to any Portacath or PICC line.   We strive to give you quality time with your provider. You may need to reschedule your appointment if you arrive late (15 or more minutes).  Arriving late affects you and other patients whose appointments are after yours.  Also, if you miss three or more appointments without notifying the office, you may be dismissed from the clinic at the provider's discretion.      For prescription refill requests, have your pharmacy contact our office and allow 72 hours for refills to be completed.    Today you received the following Feraheme, return as scheduled. Ferumoxytol Injection What is this medication? FERUMOXYTOL (FER ue MOX i tol) treats low levels of iron in your body (iron deficiency anemia). Iron is a mineral that plays an important role in making red blood cells, which carry oxygen from your lungs to the rest of your body. This medicine may be used for other purposes; ask your health care provider or pharmacist if you have questions. COMMON BRAND NAME(S): Feraheme What should I tell my care team before I take this medication? They need to know if you have any of these conditions: Anemia not caused by low iron levels High levels of iron in the blood Magnetic resonance imaging (MRI) test scheduled An unusual or allergic reaction to iron, other medications, foods, dyes, or preservatives Pregnant or trying to get pregnant Breast-feeding How should I use this medication? This medication is for injection into a vein. It is given in a hospital or clinic setting. Talk to your care team the use of this medication in  children. Special care may be needed. Overdosage: If you think you have taken too much of this medicine contact a poison control center or emergency room at once. NOTE: This medicine is only for you. Do not share this medicine with others. What if I miss a dose? It is important not to miss your dose. Call your care team if you are unable to keep an appointment. What may interact with this medication? Other iron products This list may not describe all possible interactions. Give your health care provider a list of all the medicines, herbs, non-prescription drugs, or dietary supplements you use. Also tell them if you smoke, drink alcohol, or use illegal drugs. Some items may interact with your medicine. What should I watch for while using this medication? Visit your care team regularly. Tell your care team if your symptoms do not start to get better or if they get worse. You may need blood work done while you are taking this medication. You may need to follow a special diet. Talk to your care team. Foods that contain iron include: whole grains/cereals, dried fruits, beans, or peas, leafy green vegetables, and organ meats (liver, kidney). What side effects may I notice from receiving this medication? Side effects that you should report to your care team as soon as possible: Allergic reactions--skin rash, itching, hives, swelling of the face, lips, tongue, or throat Low blood pressure--dizziness, feeling faint or lightheaded, blurry vision Shortness of breath Side effects that usually do not require medical attention (report to your care team if they continue  or are bothersome): Flushing Headache Joint pain Muscle pain Nausea Pain, redness, or irritation at injection site This list may not describe all possible side effects. Call your doctor for medical advice about side effects. You may report side effects to FDA at 1-800-FDA-1088. Where should I keep my medication? This medication is given in  a hospital or clinic and will not be stored at home. NOTE: This sheet is a summary. It may not cover all possible information. If you have questions about this medicine, talk to your doctor, pharmacist, or health care provider.  2023 Elsevier/Gold Standard (2020-10-21 00:00:00)    To help prevent nausea and vomiting after your treatment, we encourage you to take your nausea medication as directed.  BELOW ARE SYMPTOMS THAT SHOULD BE REPORTED IMMEDIATELY: *FEVER GREATER THAN 100.4 F (38 C) OR HIGHER *CHILLS OR SWEATING *NAUSEA AND VOMITING THAT IS NOT CONTROLLED WITH YOUR NAUSEA MEDICATION *UNUSUAL SHORTNESS OF BREATH *UNUSUAL BRUISING OR BLEEDING *URINARY PROBLEMS (pain or burning when urinating, or frequent urination) *BOWEL PROBLEMS (unusual diarrhea, constipation, pain near the anus) TENDERNESS IN MOUTH AND THROAT WITH OR WITHOUT PRESENCE OF ULCERS (sore throat, sores in mouth, or a toothache) UNUSUAL RASH, SWELLING OR PAIN  UNUSUAL VAGINAL DISCHARGE OR ITCHING   Items with * indicate a potential emergency and should be followed up as soon as possible or go to the Emergency Department if any problems should occur.  Please show the CHEMOTHERAPY ALERT CARD or IMMUNOTHERAPY ALERT CARD at check-in to the Emergency Department and triage nurse.  Should you have questions after your visit or need to cancel or reschedule your appointment, please contact [facility] [phone]  and follow the prompts.  Office hours are 8:00 a.m. to 4:30 p.m. Monday - Friday. Please note that voicemails left after 4:00 p.m. may not be returned until the following business day.  We are closed weekends and major holidays. You have access to a nurse at all times for urgent questions. Please call the main number to the clinic [phone] and follow the prompts.  For any non-urgent questions, you may also contact your provider using MyChart. We now offer e-Visits for anyone 56 and older to request care  online for non-urgent symptoms. For details visit [email or web address].   Also download the MyChart app! Go to the app store, search "MyChart", open the app, select [place], and log in with your MyChart username and password.  Masks are optional in the cancer centers. If you would like for your care team to wear a mask while they are taking care of you, please let them know. For doctor visits, patients may have with them one support person who is at least 77 years old. At this time, visitors are not allowed in the infusion area.

## 2021-12-18 NOTE — Progress Notes (Signed)
Patient tolerated iron infusion with no complaints voiced.  Peripheral IV site clean and dry with good blood return noted before and after infusion.  Band aid applied.  VSS with discharge and left in satisfactory condition with no s/s of distress noted.   

## 2021-12-19 ENCOUNTER — Other Ambulatory Visit: Payer: Self-pay | Admitting: Orthopedic Surgery

## 2021-12-19 DIAGNOSIS — M25512 Pain in left shoulder: Secondary | ICD-10-CM

## 2021-12-25 ENCOUNTER — Inpatient Hospital Stay (HOSPITAL_COMMUNITY): Payer: Medicare Other

## 2021-12-25 VITALS — BP 129/76 | HR 93 | Temp 97.3°F | Resp 20

## 2021-12-25 DIAGNOSIS — D508 Other iron deficiency anemias: Secondary | ICD-10-CM

## 2021-12-25 DIAGNOSIS — D509 Iron deficiency anemia, unspecified: Secondary | ICD-10-CM | POA: Diagnosis not present

## 2021-12-25 DIAGNOSIS — D472 Monoclonal gammopathy: Secondary | ICD-10-CM | POA: Diagnosis not present

## 2021-12-25 MED ORDER — SODIUM CHLORIDE 0.9 % IV SOLN: Freq: Once | INTRAVENOUS | Status: AC

## 2021-12-25 MED ORDER — ACETAMINOPHEN 325 MG PO TABS
650.0000 mg | ORAL_TABLET | Freq: Once | ORAL | Status: AC
  Administered 2021-12-25: 650 mg via ORAL
  Filled 2021-12-25: qty 2

## 2021-12-25 MED ORDER — SODIUM CHLORIDE 0.9 % IV SOLN
510.0000 mg | Freq: Once | INTRAVENOUS | Status: AC
  Administered 2021-12-25: 510 mg via INTRAVENOUS
  Filled 2021-12-25: qty 17

## 2021-12-25 MED ORDER — LORATADINE 10 MG PO TABS
10.0000 mg | ORAL_TABLET | Freq: Once | ORAL | Status: AC
  Administered 2021-12-25: 10 mg via ORAL
  Filled 2021-12-25: qty 1

## 2021-12-25 NOTE — Progress Notes (Signed)
Patient presents today for Feraheme infusion per providers order.  Vital signs WNL  Patient has no new complaints at this time.    Peripheral IV started and blood return noted pre and post infusion.  Feraheme given today per MD orders.  Stable during infusion without adverse affects.  Vital signs stable.  No complaints at this time.  Discharge from clinic ambulatory in stable condition.  Alert and oriented X 3.  Follow up with [facility] as scheduled.  

## 2021-12-25 NOTE — Patient Instructions (Signed)
[facility]  Discharge Instructions: Thank you for choosing [facility] to provide your oncology and hematology care.  If you have a lab appointment with the Cancer Center, please come in thru the Main Entrance and check in at the main information desk.  Wear comfortable clothing and clothing appropriate for easy access to any Portacath or PICC line.   We strive to give you quality time with your provider. You may need to reschedule your appointment if you arrive late (15 or more minutes).  Arriving late affects you and other patients whose appointments are after yours.  Also, if you miss three or more appointments without notifying the office, you may be dismissed from the clinic at the provider's discretion.      For prescription refill requests, have your pharmacy contact our office and allow 72 hours for refills to be completed.    Today you received the following chemotherapy and/or immunotherapy agents Feraheme      To help prevent nausea and vomiting after your treatment, we encourage you to take your nausea medication as directed.  BELOW ARE SYMPTOMS THAT SHOULD BE REPORTED IMMEDIATELY: *FEVER GREATER THAN 100.4 F (38 C) OR HIGHER *CHILLS OR SWEATING *NAUSEA AND VOMITING THAT IS NOT CONTROLLED WITH YOUR NAUSEA MEDICATION *UNUSUAL SHORTNESS OF BREATH *UNUSUAL BRUISING OR BLEEDING *URINARY PROBLEMS (pain or burning when urinating, or frequent urination) *BOWEL PROBLEMS (unusual diarrhea, constipation, pain near the anus) TENDERNESS IN MOUTH AND THROAT WITH OR WITHOUT PRESENCE OF ULCERS (sore throat, sores in mouth, or a toothache) UNUSUAL RASH, SWELLING OR PAIN  UNUSUAL VAGINAL DISCHARGE OR ITCHING   Items with * indicate a potential emergency and should be followed up as soon as possible or go to the Emergency Department if any problems should occur.  Please show the CHEMOTHERAPY ALERT CARD or IMMUNOTHERAPY ALERT CARD at check-in to the Emergency  Department and triage nurse.  Should you have questions after your visit or need to cancel or reschedule your appointment, please contact [facility] [phone]  and follow the prompts.  Office hours are 8:00 a.m. to 4:30 p.m. Monday - Friday. Please note that voicemails left after 4:00 p.m. may not be returned until the following business day.  We are closed weekends and major holidays. You have access to a nurse at all times for urgent questions. Please call the main number to the clinic [phone] and follow the prompts.  For any non-urgent questions, you may also contact your provider using MyChart. We now offer e-Visits for anyone 18 and older to request care online for non-urgent symptoms. For details visit [email or web address].   Also download the MyChart app! Go to the app store, search "MyChart", open the app, select [place], and log in with your MyChart username and password.  Masks are optional in the cancer centers. If you would like for your care team to wear a mask while they are taking care of you, please let them know. For doctor visits, patients may have with them one support person who is at least 77 years old. At this time, visitors are not allowed in the infusion area.  

## 2021-12-27 ENCOUNTER — Other Ambulatory Visit: Payer: Self-pay

## 2021-12-27 ENCOUNTER — Telehealth: Payer: Self-pay

## 2021-12-27 MED ORDER — TRULICITY 0.75 MG/0.5ML ~~LOC~~ SOAJ: 0.7500 mg | Freq: Every day | SUBCUTANEOUS | 1 refills | Status: DC

## 2021-12-27 NOTE — Telephone Encounter (Signed)
Patient called need med refill on    Dulaglutide (TRULICITY) 4.49 EE/1.0OF SOPN   Pharmacy: [place]

## 2021-12-27 NOTE — Telephone Encounter (Signed)
Med refilled.

## 2022-01-09 ENCOUNTER — Ambulatory Visit (INDEPENDENT_AMBULATORY_CARE_PROVIDER_SITE_OTHER): Payer: Medicare Other | Admitting: Urology

## 2022-01-09 ENCOUNTER — Encounter: Payer: Self-pay | Admitting: Urology

## 2022-01-09 VITALS — BP 117/73 | HR 97 | Ht 66.5 in | Wt 183.0 lb

## 2022-01-09 DIAGNOSIS — Z87442 Personal history of urinary calculi: Secondary | ICD-10-CM | POA: Diagnosis not present

## 2022-01-09 DIAGNOSIS — N131 Hydronephrosis with ureteral stricture, not elsewhere classified: Secondary | ICD-10-CM | POA: Diagnosis not present

## 2022-01-09 DIAGNOSIS — N132 Hydronephrosis with renal and ureteral calculous obstruction: Secondary | ICD-10-CM

## 2022-01-09 NOTE — Progress Notes (Signed)
History of Present Illness: NATOYA VISCOMI is a 76 y.o. year old female here for follow-up of urolithiasis.  She has had multiple left-sided procedures performed for recurrent urolithiasis.  Most recent procedure was 19, 2023.  That included extraction of the distal ureteral stone as well as extraction of multiple renal calyceal calculi.  Stent was removed at her last visit here on 20 June.  Follow-up renal ultrasound revealed moderate left hydro on the left, questionable stone in the left distal ureter.  She has had no recent gross hematuria although she did have that for a day after the stent was Past Medical History:  Diagnosis Date   Anxiety disorder    Arthritis    Chronic back pain    Chronic pain of both shoulders    per pt told due to cervical spine pinched nerve   CKD (chronic kidney disease), stage III [facility])    nephrologist--- dr Theador Hawthorne;   due to tubular necrosis   Claustrophobia    Depression    Edema of both lower extremities    GERD (gastroesophageal reflux disease)    Headache    Hemorrhoids    w/ intermittant bleeding   History of adenomatous polyp of colon    History of kidney stones    Hx-TIA (transient ischemic attack) 02/26/2016   per pt no residual   Hydronephrosis, left    followed by urologist--- dr Diona Fanti;   persistant   Hyperlipidemia    Hypertension    Iron deficiency    followed by [facility] w/ hx iron infusions   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   oncologist--- [facility];  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, active survillence   Lactose intolerance in adult 02/01/2016   Left ureteral calculus    Lumbar disc disease with radiculopathy    Nephrolithiasis    per CT 08-11-2021  left > right   Neuropathy, peripheral    Nocturia more than twice per night    Normocytic anemia    OSA (obstructive sleep apnea)    per study 2007;   per pt no Bipap use since 2021   Renal atrophy, bilateral    Renal cyst, right     Sciatica    Sigmoid diverticulosis    Type 2 diabetes mellitus ([place])     Past Surgical History:  Procedure Laterality Date   BIOPSY N/A 03/17/2013   Procedure: GASTRIC BIOPSIES;  Surgeon: Danie Binder, MD;  [facility]: [facility];  Service: Endoscopy;  Laterality: N/A;   BIOPSY  06/10/2015   Procedure: BIOPSY;  Surgeon: Danie Binder, MD;  [facility];  Service: Endoscopy;;  gastric biopsy   CARDIAC CATHETERIZATION  05/11/2003   @ [facility] by dr t. Claiborne Billings;  Abnormal cardiolite/   Normal coronary arteries and normal LVF,  ef  63%   CARDIOVASCULAR STRESS TEST  01/01/2014   normal lexiscan cardiolite/  no ischemia/ infarct/  normal LV function and wall motion ,  ef 81%   COLONOSCOPY WITH PROPOFOL N/A 02/26/2017   Procedure: COLONOSCOPY WITH PROPOFOL;  Surgeon: Danie Binder, MD;  [facility];  Service: Endoscopy;  Laterality: N/A;  11:00am   COLONOSCOPY WITH PROPOFOL N/A 09/22/2019   Procedure: COLONOSCOPY WITH PROPOFOL;  Surgeon: Danie Binder, MD;  [facility];  Service: Endoscopy;  Laterality: N/A;  1:15   CYSTO/   RIGHT URETEROSOCOPY LASER LITHOTRIPSY STONE EXTRACTION  10/13/2004   CYSTO/  RIGHT RETROGRADE PYELOGRAM/  PLACMENT RIGHT URETERAL STENT  01/10/2010   CYSTOSCOPY W/ URETERAL STENT PLACEMENT Right 08/19/2015   Procedure: CYSTOSCOPY WITH RETROGRADE PYELOGRAM/URETERAL STENT PLACEMENT;  Surgeon: Franchot Gallo, MD;  Location: [facility];  Service: Urology;  Laterality: Right;   CYSTOSCOPY W/ URETERAL STENT PLACEMENT Left 02/23/2020   Procedure: CYSTOSCOPY LEFT  RETROGRADE PYELOGRAM LEFT URETEROSCOPY URETERAL STENT PLACEMENT;  Surgeon: Franchot Gallo, MD;  [facility];  Service: Urology;  Laterality: Left;   CYSTOSCOPY WITH RETROGRADE PYELOGRAM, URETEROSCOPY AND STENT PLACEMENT Left 10/21/2014   Procedure: CYSTOSCOPY WITH RETROGRADE PYELOGRAM, URETEROSCOPY AND STENT PLACEMENT;  Surgeon: Franchot Gallo, MD;   Location: [facility];  Service: Urology;  Laterality: Left;   CYSTOSCOPY WITH RETROGRADE PYELOGRAM, URETEROSCOPY AND STENT PLACEMENT Right 11/22/2015   Procedure: CYSTOSCOPY, RIGHT URETERAL STENT REMOVAL; RIGHT RETROGRADE PYELOGRAM, RIGHT URETEROSCOPY WITH BALLOON DILATION; RIGHT URETERAL STENT PLACEMENT;  Surgeon: Franchot Gallo, MD;  [facility];  Service: Urology;  Laterality: Right;   CYSTOSCOPY WITH RETROGRADE PYELOGRAM, URETEROSCOPY AND STENT PLACEMENT Left 10/18/2020   Procedure: CYSTOSCOPY WITH LEFT RETROGRADE PYELOGRAM, LEFT URETEROSCOPY  WITH LASER AND LEFT URETERAL STENT PLACEMENT;  Surgeon: Franchot Gallo, MD;  [facility];  Service: Urology;  Laterality: Left;   CYSTOSCOPY WITH RETROGRADE PYELOGRAM, URETEROSCOPY AND STENT PLACEMENT Left 10/26/2021   Procedure: CYSTOSCOPY WITH RETROGRADE PYELOGRAM, URETEROSCOPY, STONE EXTRACTION  AND STENT PLACEMENT;  Surgeon: Franchot Gallo, MD;  Location[facility];  Service: Urology;  Laterality: Left;   CYSTOSCOPY/RETROGRADE/URETEROSCOPY/STONE EXTRACTION WITH BASKET Bilateral 08/02/2015   Procedure: CYSTOSCOPY; BILATERAL RETROGRADE PYELOGRAMS; BILATERAL URETEROSCOPY, STONE EXTRACTION WITH BASKET;  Surgeon: Franchot Gallo, MD;  [facility];  Service: Urology;  Laterality: Bilateral;   CYSTOSCOPY/RETROGRADE/URETEROSCOPY/STONE EXTRACTION WITH BASKET Right 06/28/2015   Procedure: CYSTOSCOPY/RETROGRADE/URETEROSCOPY/STONE EXTRACTION WITH BASKET, RIGHT URETERAL DOUBLE J STENT PLACEMENT;  Surgeon: Franchot Gallo, MD;  [facility];  Service: Urology;  Laterality: Right;   ESOPHAGOGASTRODUODENOSCOPY (EGD) WITH PROPOFOL N/A 03/17/2013   Procedure: ESOPHAGOGASTRODUODENOSCOPY (EGD) WITH PROPOFOL;  Surgeon: Danie Binder, MD;  [facility];  Service: Endoscopy;  Laterality: N/A;   ESOPHAGOGASTRODUODENOSCOPY (EGD) WITH PROPOFOL N/A 06/10/2015   Distal gastritis, distal esophageal stricture s/p  dilation   ESOPHAGOGASTRODUODENOSCOPY (EGD) WITH PROPOFOL N/A 02/26/2017   Procedure: ESOPHAGOGASTRODUODENOSCOPY (EGD) WITH PROPOFOL;  Surgeon: Danie Binder, MD;  [facility];  Service: Endoscopy;  Laterality: N/A;   FLEXIBLE SIGMOIDOSCOPY  09/11/2011   VVZ:SMOLMBEM Internal hemorrhoids   HOLMIUM LASER APPLICATION Left 75/44/9201   Procedure: HOLMIUM LASER APPLICATION;  Surgeon: Franchot Gallo, MD;  Location[facility];  Service: Urology;  Laterality: Left;   HOLMIUM LASER APPLICATION Right 00/71/2197   Procedure: HOLMIUM LASER APPLICATION;  Surgeon: Franchot Gallo, MD;  [facility];  Service: Urology;  Laterality: Right;   HOLMIUM LASER APPLICATION  58/83/2549   Procedure: HOLMIUM LASER APPLICATION;  Surgeon: Franchot Gallo, MD;  [facility];  Service: Urology;;   HOLMIUM LASER APPLICATION Left 82/64/1583   Procedure: HOLMIUM LASER APPLICATION;  Surgeon: Franchot Gallo, MD;  [facility];  Service: Urology;  Laterality: Left;   MASS EXCISION Left 03/04/2015   Procedure: EXCISION OF SOFT TISSUE NEOPLASM LEFT ARM;  Surgeon: Aviva Signs, MD;  Location: [facility];  Service: General;  Laterality: Left;   PERCUTANEOUS NEPHROSTOLITHOTOMY Bilateral 05/2010   '@[facility]'$    POLYPECTOMY N/A 03/17/2013   Procedure: GASTRIC POLYPECTOMY;  Surgeon: Danie Binder, MD;  [facility];  Service: Endoscopy;  Laterality: N/A;   POLYPECTOMY  02/26/2017   Procedure: POLYPECTOMY;  Surgeon: Danie Binder, MD;  [facility];  Service: Endoscopy;;  polyp at cecum, ascending colon polyps x3, hepatic flexure polyps x8, transverse colon polyps x8    POLYPECTOMY  09/22/2019   Procedure: POLYPECTOMY;  Surgeon: Danie Binder, MD;  [facility];  Service: Endoscopy;;   REMOVAL RIGHT THIGH CYST  2006   SAVORY DILATION N/A 03/17/2013   Procedure: SAVORY DILATION;  Surgeon: Danie Binder, MD;  [facility];  Service: Endoscopy;   Laterality: N/A;  #12.8, 14, 15, 16 dilators used   SAVORY DILATION N/A 06/10/2015   Procedure: SAVORY DILATION;  Surgeon: Danie Binder, MD;  [facility];  Service: Endoscopy;  Laterality: N/A;   VAGINAL HYSTERECTOMY  1970's    Home Medications:  (Not in a hospital admission)   Allergies:  Allergies  Allergen Reactions   Ace Inhibitors Cough    Patient doesn't recall   Keflex [Cephalexin] Diarrhea and Nausea And Vomiting   Nitrofurantoin Diarrhea and Nausea And Vomiting   Penicillins Hives and Other (See Comments)    Reaction:  Blisters on hands/feet  Has patient had a PCN reaction causing immediate rash, facial/tongue/throat swelling, SOB or lightheadedness with hypotension: No Has patient had a PCN reaction causing severe rash involving mucus membranes or skin necrosis: No Has patient had a PCN reaction that required hospitalization No Has patient had a PCN reaction occurring within the last 10 years: No If all of the above answers are "NO", then may proceed with Cephalosporin use.    Family History  Problem Relation Age of Onset   Hypertension Mother    Diabetes Mother    Heart failure Mother    Dementia Mother    Emphysema Father    Hypertension Father    Diabetes Brother    GER disease Brother    Hypertension Sister    Hypertension Sister    Cancer Other        family history    Diabetes Other        family history    Heart defect Other        famiily history    Arthritis Other        family history    Anesthesia problems Neg Hx    Hypotension Neg Hx    Malignant hyperthermia Neg Hx    Pseudochol deficiency Neg Hx    Colon cancer Neg Hx     Social History:  reports that she quit smoking about 33 years ago. Her smoking use included cigarettes. She started smoking about 47 years ago. She has a 20.00 pack-year smoking history. She has never used smokeless tobacco. She reports that she does not drink alcohol and does not use drugs.  ROS: A  complete review of systems was performed.  All systems are negative except for pertinent findings as noted.  Physical Exam:  Vital signs in last 24 hours: '@VSRANGES'$ @ General:  Alert and oriented, No acute distress HEENT: Normocephalic, atraumatic Neck: No JVD or lymphadenopathy Cardiovascular: Regular rate  Lungs: Normal inspiratory/expiratory excursion Abdomen: Soft, nontender, nondistended, no abdominal masses Back: No CVA tenderness Extremities: No edema Neurologic: Grossly intact  I have reviewed prior pt notes  I have reviewed notes from referring/previous physicians   I have independently reviewed prior imaging--most recent CT and ultrasound images  I have reviewed prior urine culture   Impression/Assessment:  1.  History of recurrent urolithiasis on the left side, status  post most recent ureteroscopic management in June.  Questionable remaining stone in left distal ureter although patient asymptomatic  2.  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  I will repeat noncontrasted scan of the abdomen and pelvis follow-up hydro and questionable stone  2.  She will need 24-hour urine done-she states that has been performed recently but I cannot find results  3.  We will call with results and follow-up  Jorja Loa 01/09/2022, 1:02 PM  Lillette Boxer. Cherese Lozano MD

## 2022-01-11 ENCOUNTER — Other Ambulatory Visit: Payer: Medicare Other

## 2022-01-11 DIAGNOSIS — N132 Hydronephrosis with renal and ureteral calculous obstruction: Secondary | ICD-10-CM

## 2022-01-11 LAB — URINALYSIS, ROUTINE W REFLEX MICROSCOPIC
Bilirubin, UA: NEGATIVE
Glucose, UA: NEGATIVE
Ketones, UA: NEGATIVE
Nitrite, UA: NEGATIVE
RBC, UA: NEGATIVE
Specific Gravity, UA: 1.01 (ref 1.005–1.030)
Urobilinogen, Ur: 0.2 mg/dL (ref 0.2–1.0)
pH, UA: 5.5 (ref 5.0–7.5)

## 2022-01-11 LAB — MICROSCOPIC EXAMINATION: Renal Epithel, UA: NONE SEEN /hpf

## 2022-01-17 ENCOUNTER — Other Ambulatory Visit: Payer: Self-pay | Admitting: Orthopedic Surgery

## 2022-01-17 DIAGNOSIS — R0902 Hypoxemia: Secondary | ICD-10-CM | POA: Diagnosis not present

## 2022-01-17 DIAGNOSIS — M25512 Pain in left shoulder: Secondary | ICD-10-CM

## 2022-01-19 ENCOUNTER — Other Ambulatory Visit: Payer: Self-pay | Admitting: Family Medicine

## 2022-01-26 ENCOUNTER — Encounter: Payer: Self-pay | Admitting: Cardiology

## 2022-01-26 ENCOUNTER — Ambulatory Visit (INDEPENDENT_AMBULATORY_CARE_PROVIDER_SITE_OTHER): Payer: Medicare Other | Admitting: Cardiology

## 2022-01-26 VITALS — BP 118/70 | HR 84 | Ht 66.0 in | Wt 187.6 lb

## 2022-01-26 DIAGNOSIS — R0789 Other chest pain: Secondary | ICD-10-CM | POA: Diagnosis not present

## 2022-01-26 DIAGNOSIS — I10 Essential (primary) hypertension: Secondary | ICD-10-CM

## 2022-01-26 DIAGNOSIS — E782 Mixed hyperlipidemia: Secondary | ICD-10-CM

## 2022-01-26 DIAGNOSIS — R079 Chest pain, unspecified: Secondary | ICD-10-CM

## 2022-01-26 NOTE — Progress Notes (Signed)
Clinical Summary Cheryl Abbott is a 76 y.o.female seen today for follow up of the following medical problems.    1. Chest pain/SOB - echo 02/2016 LVEF 60-65%, grade I diastoilc dysfunction - 12/2013 nuclear stress without ischemia, low risk.  07/2016 PFTs: mild restrictive disease   03/2018 LVEF 55-60%, no WMAs, indeterminate diastolic function - still some SOB at times, overall stable. No recent chest pain  - recent chest pains. Tightness midchest, can occur at rest or with activity. 8/10 in severity. No other associated symptoms. Lasts about 5 minutes. No set frequency.  - similar to her chronic symptoms - no relation to food or eating.  - chronic SOB/DOE with some progresson - can be better with NG      2. History of TIA  On ASA/Clopidogrel per neurolgoy     3. History of multiple myeloma   4. Fatigue - see in ER 03/11/18 with generalized fatigue     5. HTN - compliant with meds, controlled at other recent visits.     7. OSA Not comfortable wearing cpap -      8. HL - 05/2018 TC 172 HDL 47 TG 120 LDL 103 - 11/2018 TC 162 TG 121 HDL 47 LDL 91 - compliant with statin Jan 2023 TC 129 TG 152 HDL 41 LDL 58   9.History of recurrent kidney stones Past Medical History:  Diagnosis Date   Anxiety disorder    Arthritis    Chronic back pain    Chronic pain of both shoulders    per pt told due to cervical spine pinched nerve   CKD (chronic kidney disease), stage III [facility])    nephrologist--- dr Theador Hawthorne;   due to tubular necrosis   Claustrophobia    Depression    Edema of both lower extremities    GERD (gastroesophageal reflux disease)    Headache    Hemorrhoids    w/ intermittant bleeding   History of adenomatous polyp of colon    History of kidney stones    Hx-TIA (transient ischemic attack) 02/26/2016   per pt no residual   Hydronephrosis, left    followed by urologist--- dr Diona Fanti;   persistant   Hyperlipidemia    Hypertension    Iron deficiency     followed by [facility] w/ hx iron infusions   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   oncologist--- [facility];  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, active survillence   Lactose intolerance in adult 02/01/2016   Left ureteral calculus    Lumbar disc disease with radiculopathy    Nephrolithiasis    per CT 08-11-2021  left > right   Neuropathy, peripheral    Nocturia more than twice per night    Normocytic anemia    OSA (obstructive sleep apnea)    per study 2007;   per pt no Bipap use since 2021   Renal atrophy, bilateral    Renal cyst, right    Sciatica    Sigmoid diverticulosis    Type 2 diabetes mellitus (HCC)      Allergies  Allergen Reactions   Ace Inhibitors Cough    Patient doesn't recall   Keflex [Cephalexin] Diarrhea and Nausea And Vomiting   Nitrofurantoin Diarrhea and Nausea And Vomiting   Penicillins Hives and Other (See Comments)    Reaction:  Blisters on hands/feet  Has patient had a PCN reaction causing immediate rash, facial/tongue/throat swelling, SOB or lightheadedness with hypotension: No  Has patient had a PCN reaction causing severe rash involving mucus membranes or skin necrosis: No Has patient had a PCN reaction that required hospitalization No Has patient had a PCN reaction occurring within the last 10 years: No If all of the above answers are "NO", then may proceed with Cephalosporin use.     Current Outpatient Medications  Medication Sig Dispense Refill   acetaminophen (TYLENOL) 500 MG tablet Take 1,000 mg by mouth every 6 (six) hours as needed for mild pain.     amLODipine (NORVASC) 10 MG tablet TAKE 1 TABLET BY MOUTH EVERY DAY 90 tablet 1   bisacodyl (DULCOLAX) 10 MG suppository Place 1 suppository (10 mg total) rectally as needed for moderate constipation. 12 suppository 0   bisacodyl (DULCOLAX) 5 MG EC tablet Take 5 mg by mouth daily as needed for moderate constipation.     ciprofloxacin (CIPRO) 500 MG tablet Take 1 tablet  (500 mg total) by mouth every 12 (twelve) hours. 14 tablet 0   diclofenac Sodium (VOLTAREN) 1 % GEL APPLY 4 G TOPICALLY 4 (FOUR) TIMES DAILY AS NEEDED (PAIN). 400 g 5   diphenhydramine-acetaminophen (TYLENOL PM) 25-500 MG TABS tablet Take 2 tablets by mouth at bedtime.     docusate sodium (COLACE) 100 MG capsule Take 1 capsule (100 mg total) by mouth 2 (two) times daily. (Patient taking differently: Take 100 mg by mouth 2 (two) times daily as needed.) 10 capsule 0   Dulaglutide (TRULICITY) 1.61 WR/6.0AV SOPN Inject 0.75 mg into the skin daily. 2 mL 1   Fluticasone-Umeclidin-Vilant (TRELEGY ELLIPTA) 100-62.5-25 MCG/ACT AEPB Inhale 1 puff into the lungs daily. 1 each 11   gabapentin (NEURONTIN) 100 MG capsule Take 100 mg by mouth daily.     gabapentin (NEURONTIN) 300 MG capsule Take 300 mg by mouth at bedtime.     linagliptin (TRADJENTA) 5 MG TABS tablet Take 1 tablet (5 mg total) by mouth daily. (Patient taking differently: Take 5 mg by mouth daily.) 30 tablet 5   nitroGLYCERIN (NITROSTAT) 0.4 MG SL tablet Place 1 tablet (0.4 mg total) under the tongue every 5 (five) minutes as needed for chest pain. Up to 3 tablets, then call 911. 25 tablet 3   OneTouch Delica Lancets 40J MISC Once daily testing dx e11.9 100 each 5   ONETOUCH ULTRA test strip USE AS INSTRUCTED ONCE DAILY DX E11.9 100 strip 5   oxybutynin (DITROPAN) 5 MG tablet 1 tab po q 6 hrs prn stent discomfort 30 tablet 1   pantoprazole (PROTONIX) 40 MG tablet TAKE 1 TABLET BY MOUTH TWICE A DAY 180 tablet 1   PARoxetine (PAXIL) 40 MG tablet Take 1 tablet (40 mg total) by mouth in the morning. 90 tablet 1   rosuvastatin (CRESTOR) 20 MG tablet TAKE 1 TABLET BY MOUTH EVERY DAY (Patient taking differently: Take 20 mg by mouth.) 90 tablet 3   SYMBICORT 80-4.5 MCG/ACT inhaler Inhale 2 puffs into the lungs 2 (two) times daily.     traMADol (ULTRAM) 50 MG tablet TAKE 1 TABLET BY MOUTH EVERY 6 HOURS AS NEEDED 60 tablet 0   triamcinolone cream (KENALOG)  0.1 % Apply 1 application topically 2 (two) times daily. (Patient taking differently: Apply 1 application  topically 2 (two) times daily as needed.) 30 g 0   UNABLE TO FIND Diabetic shoes x 1 pair, inserts x 3 pair  DX E11.9 1 each 0   No current facility-administered medications for this visit.     Past Surgical History:  Procedure Laterality Date   BIOPSY N/A 03/17/2013   Procedure: GASTRIC BIOPSIES;  Surgeon: Danie Binder, MD;  [facility]: [facility];  Service: Endoscopy;  Laterality: N/A;   BIOPSY  06/10/2015   Procedure: BIOPSY;  Surgeon: Danie Binder, MD;  [facility];  Service: Endoscopy;;  gastric biopsy   CARDIAC CATHETERIZATION  05/11/2003   @ [facility] by dr t. Claiborne Billings;  Abnormal cardiolite/   Normal coronary arteries and normal LVF,  ef  63%   CARDIOVASCULAR STRESS TEST  01/01/2014   normal lexiscan cardiolite/  no ischemia/ infarct/  normal LV function and wall motion ,  ef 81%   COLONOSCOPY WITH PROPOFOL N/A 02/26/2017   Procedure: COLONOSCOPY WITH PROPOFOL;  Surgeon: Danie Binder, MD;  [facility];  Service: Endoscopy;  Laterality: N/A;  11:00am   COLONOSCOPY WITH PROPOFOL N/A 09/22/2019   Procedure: COLONOSCOPY WITH PROPOFOL;  Surgeon: Danie Binder, MD;  [facility];  Service: Endoscopy;  Laterality: N/A;  1:15   CYSTO/   RIGHT URETEROSOCOPY LASER LITHOTRIPSY STONE EXTRACTION  10/13/2004   CYSTO/  RIGHT RETROGRADE PYELOGRAM/  PLACMENT RIGHT URETERAL STENT  01/10/2010   CYSTOSCOPY W/ URETERAL STENT PLACEMENT Right 08/19/2015   Procedure: CYSTOSCOPY WITH RETROGRADE PYELOGRAM/URETERAL STENT PLACEMENT;  Surgeon: Franchot Gallo, MD;  Location: [facility];  Service: Urology;  Laterality: Right;   CYSTOSCOPY W/ URETERAL STENT PLACEMENT Left 02/23/2020   Procedure: CYSTOSCOPY LEFT  RETROGRADE PYELOGRAM LEFT URETEROSCOPY URETERAL STENT PLACEMENT;  Surgeon: Franchot Gallo, MD;  [facility];  Service: Urology;   Laterality: Left;   CYSTOSCOPY WITH RETROGRADE PYELOGRAM, URETEROSCOPY AND STENT PLACEMENT Left 10/21/2014   Procedure: CYSTOSCOPY WITH RETROGRADE PYELOGRAM, URETEROSCOPY AND STENT PLACEMENT;  Surgeon: Franchot Gallo, MD;  Location: [facility];  Service: Urology;  Laterality: Left;   CYSTOSCOPY WITH RETROGRADE PYELOGRAM, URETEROSCOPY AND STENT PLACEMENT Right 11/22/2015   Procedure: CYSTOSCOPY, RIGHT URETERAL STENT REMOVAL; RIGHT RETROGRADE PYELOGRAM, RIGHT URETEROSCOPY WITH BALLOON DILATION; RIGHT URETERAL STENT PLACEMENT;  Surgeon: Franchot Gallo, MD;  [facility];  Service: Urology;  Laterality: Right;   CYSTOSCOPY WITH RETROGRADE PYELOGRAM, URETEROSCOPY AND STENT PLACEMENT Left 10/18/2020   Procedure: CYSTOSCOPY WITH LEFT RETROGRADE PYELOGRAM, LEFT URETEROSCOPY  WITH LASER AND LEFT URETERAL STENT PLACEMENT;  Surgeon: Franchot Gallo, MD;  [facility];  Service: Urology;  Laterality: Left;   CYSTOSCOPY WITH RETROGRADE PYELOGRAM, URETEROSCOPY AND STENT PLACEMENT Left 10/26/2021   Procedure: CYSTOSCOPY WITH RETROGRADE PYELOGRAM, URETEROSCOPY, STONE EXTRACTION  AND STENT PLACEMENT;  Surgeon: Franchot Gallo, MD;  [facility];  Service: Urology;  Laterality: Left;   CYSTOSCOPY/RETROGRADE/URETEROSCOPY/STONE EXTRACTION WITH BASKET Bilateral 08/02/2015   Procedure: CYSTOSCOPY; BILATERAL RETROGRADE PYELOGRAMS; BILATERAL URETEROSCOPY, STONE EXTRACTION WITH BASKET;  Surgeon: Franchot Gallo, MD;  [facility];  Service: Urology;  Laterality: Bilateral;   CYSTOSCOPY/RETROGRADE/URETEROSCOPY/STONE EXTRACTION WITH BASKET Right 06/28/2015   Procedure: CYSTOSCOPY/RETROGRADE/URETEROSCOPY/STONE EXTRACTION WITH BASKET, RIGHT URETERAL DOUBLE J STENT PLACEMENT;  Surgeon: Franchot Gallo, MD;  [facility];  Service: Urology;  Laterality: Right;   ESOPHAGOGASTRODUODENOSCOPY (EGD) WITH PROPOFOL N/A 03/17/2013   Procedure: ESOPHAGOGASTRODUODENOSCOPY  (EGD) WITH PROPOFOL;  Surgeon: Danie Binder, MD;  [facility];  Service: Endoscopy;  Laterality: N/A;   ESOPHAGOGASTRODUODENOSCOPY (EGD) WITH PROPOFOL N/A 06/10/2015   Distal gastritis, distal esophageal stricture s/p dilation   ESOPHAGOGASTRODUODENOSCOPY (EGD) WITH PROPOFOL N/A 02/26/2017   Procedure: ESOPHAGOGASTRODUODENOSCOPY (EGD) WITH PROPOFOL;  Surgeon: Danie Binder, MD;  [facility];  Service:  Endoscopy;  Laterality: N/A;   FLEXIBLE SIGMOIDOSCOPY  09/11/2011   MAU:[id number] Internal hemorrhoids   HOLMIUM LASER APPLICATION Left 25/63/8937   Procedure: HOLMIUM LASER APPLICATION;  Surgeon: Franchot Gallo, MD;  Location: [facility];  Service: Urology;  Laterality: Left;   HOLMIUM LASER APPLICATION Right 34/28/7681   Procedure: HOLMIUM LASER APPLICATION;  Surgeon: Franchot Gallo, MD;  [facility];  Service: Urology;  Laterality: Right;   HOLMIUM LASER APPLICATION  15/72/6203   Procedure: HOLMIUM LASER APPLICATION;  Surgeon: Franchot Gallo, MD;  [facility];  Service: Urology;;   HOLMIUM LASER APPLICATION Left 55/97/4163   Procedure: HOLMIUM LASER APPLICATION;  Surgeon: Franchot Gallo, MD;  [facility];  Service: Urology;  Laterality: Left;   MASS EXCISION Left 03/04/2015   Procedure: EXCISION OF SOFT TISSUE NEOPLASM LEFT ARM;  Surgeon: Aviva Signs, MD;  Location: [facility];  Service: General;  Laterality: Left;   PERCUTANEOUS NEPHROSTOLITHOTOMY Bilateral 05/2010   _0    POLYPECTOMY N/A 03/17/2013   Procedure: GASTRIC POLYPECTOMY;  Surgeon: Danie Binder, MD;  [facility];  Service: Endoscopy;  Laterality: N/A;   POLYPECTOMY  02/26/2017   Procedure: POLYPECTOMY;  Surgeon: Danie Binder, MD;  [facility];  Service: Endoscopy;;  polyp at cecum, ascending colon polyps x3, hepatic flexure polyps x8, transverse colon polyps x8    POLYPECTOMY  09/22/2019   Procedure: POLYPECTOMY;  Surgeon: Danie Binder, MD;   [facility];  Service: Endoscopy;;   REMOVAL RIGHT THIGH CYST  2006   SAVORY DILATION N/A 03/17/2013   Procedure: SAVORY DILATION;  Surgeon: Danie Binder, MD;  Location[facility];  Service: Endoscopy;  Laterality: N/A;  #12.8, 14, 15, 16 dilators used   SAVORY DILATION N/A 06/10/2015   Procedure: SAVORY DILATION;  Surgeon: Danie Binder, MD;  [facility];  Service: Endoscopy;  Laterality: N/A;   VAGINAL HYSTERECTOMY  1970's     Allergies  Allergen Reactions   Ace Inhibitors Cough    Patient doesn't recall   Keflex [Cephalexin] Diarrhea and Nausea And Vomiting   Nitrofurantoin Diarrhea and Nausea And Vomiting   Penicillins Hives and Other (See Comments)    Reaction:  Blisters on hands/feet  Has patient had a PCN reaction causing immediate rash, facial/tongue/throat swelling, SOB or lightheadedness with hypotension: No Has patient had a PCN reaction causing severe rash involving mucus membranes or skin necrosis: No Has patient had a PCN reaction that required hospitalization No Has patient had a PCN reaction occurring within the last 10 years: No If all of the above answers are "NO", then may proceed with Cephalosporin use.      Family History  Problem Relation Age of Onset   Hypertension Mother    Diabetes Mother    Heart failure Mother    Dementia Mother    Emphysema Father    Hypertension Father    Diabetes Brother    GER disease Brother    Hypertension Sister    Hypertension Sister    Cancer Other        family history    Diabetes Other        family history    Heart defect Other        famiily history    Arthritis Other        family history    Anesthesia problems Neg Hx    Hypotension Neg Hx    Malignant hyperthermia Neg Hx    Pseudochol  deficiency Neg Hx    Colon cancer Neg Hx      Social History Cheryl Abbott reports that she quit smoking about 33 years ago. Her smoking use included cigarettes. She started smoking about 47 years  ago. She has a 20.00 pack-year smoking history. She has never used smokeless tobacco. Cheryl Abbott reports no history of alcohol use.   Review of Systems CONSTITUTIONAL: No weight loss, fever, chills, weakness or fatigue.  HEENT: Eyes: No visual loss, blurred vision, double vision or yellow sclerae.No hearing loss, sneezing, congestion, runny nose or sore throat.  SKIN: No rash or itching.  CARDIOVASCULAR: per hpi RESPIRATORY: No shortness of breath, cough or sputum.  GASTROINTESTINAL: No anorexia, nausea, vomiting or diarrhea. No abdominal pain or blood.  GENITOURINARY: No burning on urination, no polyuria NEUROLOGICAL: No headache, dizziness, syncope, paralysis, ataxia, numbness or tingling in the extremities. No change in bowel or bladder control.  MUSCULOSKELETAL: No muscle, back pain, joint pain or stiffness.  LYMPHATICS: No enlarged nodes. No history of splenectomy.  PSYCHIATRIC: No history of depression or anxiety.  ENDOCRINOLOGIC: No reports of sweating, cold or heat intolerance. No polyuria or polydipsia.  Marland Kitchen   Physical Examination Today's Vitals   01/26/22 1054  BP: 118/70  Pulse: 84  SpO2: 96%  Weight: 187 lb 9.6 oz (85.1 kg)  Height: _0  (1.676 m)   Body mass index is 30.28 kg/m.  Gen: resting comfortably, no acute distress HEENT: no scleral icterus, pupils equal round and reactive, no palptable cervical adenopathy,  CV: RRR, no m/r/g, no jvd.  Resp: Clear to auscultation bilaterally GI: abdomen is soft, non-tender, non-distended, normal bowel sounds, no hepatosplenomegaly MSK: extremities are warm, no edema.  Skin: warm, no rash Neuro:  no focal deficits Psych: appropriate affect   Diagnostic Studies  03/2018 echo Study Conclusions   - Left ventricle: The cavity size was normal. Wall thickness was   increased in a pattern of mild LVH. Systolic function was normal.   The estimated ejection fraction was in the range of 55% to 60%.   Wall motion was  normal; there were no regional wall motion   abnormalities. Indeterminate diastolic function. - Right ventricle: The cavity size was mildly dilated. - Right atrium: Central venous pressure (est): 3 mm Hg. - Atrial septum: No defect or patent foramen ovale was identified. - Tricuspid valve: There was trivial regurgitation. - Pulmonary arteries: PA peak pressure: 21 mm Hg (S). - Pericardium, extracardiac: There was no pericardial effusion.     Assessment and Plan  1. Chest pain - ongoing chest pains symptoms, will obtain exercise nuclear stress test to further evaluate   2. HTN - at goal, continue current meds   3. Hyperlipidemia - at goal, continue current meds      Arnoldo Lenis, M.D.

## 2022-01-26 NOTE — Patient Instructions (Signed)
Medication Instructions:  Your physician recommends that you continue on your current medications as directed. Please refer to the Current Medication list given to you today.   Labwork: None  Testing/Procedures: Your physician has requested that you have a Exercise Nuclear Stress Test. For further information please visit [email or web address]. Please follow instruction sheet, as given.   Follow-Up: Follow up with Dr. Harl Bowie in 6 months.   Any Other Special Instructions Will Be Listed Below (If Applicable).     If you need a refill on your cardiac medications before your next appointment, please call your pharmacy.

## 2022-02-01 ENCOUNTER — Ambulatory Visit (HOSPITAL_BASED_OUTPATIENT_CLINIC_OR_DEPARTMENT_OTHER)
Admission: RE | Admit: 2022-02-01 | Discharge: 2022-02-01 | Disposition: A | Discharge status: Home / Self Care | Payer: Medicare Other | Source: Ambulatory Visit | Attending: Cardiology | Admitting: Cardiology

## 2022-02-01 ENCOUNTER — Ambulatory Visit (HOSPITAL_COMMUNITY)
Admission: RE | Admit: 2022-02-01 | Discharge: 2022-02-01 | Disposition: A | Discharge status: Home / Self Care | Payer: Medicare Other | Source: Ambulatory Visit | Attending: Cardiology | Admitting: Cardiology

## 2022-02-01 DIAGNOSIS — R079 Chest pain, unspecified: Secondary | ICD-10-CM

## 2022-02-01 LAB — NM MYOCAR MULTI W/SPECT W/WALL MOTION / EF
LV dias vol: 43 mL (ref 46–106)
LV sys vol: 16 mL
Nuc Stress EF: 64 %
Peak HR: 112 {beats}/min
RATE: 0.7
Rest HR: 91 {beats}/min
Rest Nuclear Isotope Dose: 10 mCi
SDS: 0
SRS: 6
SSS: 6
ST Depression (mm): 0 mm
Stress Nuclear Isotope Dose: 31 mCi
TID: 1.07

## 2022-02-01 MED ORDER — SODIUM CHLORIDE FLUSH 0.9 % IV SOLN
INTRAVENOUS | Status: AC
  Administered 2022-02-01: 10 mL via INTRAVENOUS
  Filled 2022-02-01: qty 10

## 2022-02-01 MED ORDER — TECHNETIUM TC 99M TETROFOSMIN IV KIT
30.0000 | PACK | Freq: Once | INTRAVENOUS | Status: AC | PRN
  Administered 2022-02-01: 31 via INTRAVENOUS

## 2022-02-01 MED ORDER — REGADENOSON 0.4 MG/5ML IV SOLN
INTRAVENOUS | Status: AC
  Administered 2022-02-01: 0.4 mg via INTRAVENOUS
  Filled 2022-02-01: qty 5

## 2022-02-01 MED ORDER — TECHNETIUM TC 99M TETROFOSMIN IV KIT
10.0000 | PACK | Freq: Once | INTRAVENOUS | Status: AC | PRN
  Administered 2022-02-01: 10 via INTRAVENOUS

## 2022-02-05 ENCOUNTER — Telehealth: Payer: Self-pay | Admitting: *Deleted

## 2022-02-05 NOTE — Patient Outreach (Signed)
  Care Coordination   02/05/2022 Name: Cheryl Abbott MRN: [id number] DOB: [birth year]   Care Coordination Outreach Attempts:  An unsuccessful telephone outreach was attempted today to offer the patient information about available care coordination services as a benefit of their health plan.   Follow Up Plan:  Additional outreach attempts will be made to offer the patient care coordination information and services.   Encounter Outcome:  No Answer  Care Coordination Interventions Activated:  No   Care Coordination Interventions:  No, not indicated    Emelia Loron RN, BSN [place] [phone] [email or web address]

## 2022-02-06 ENCOUNTER — Telehealth: Payer: Self-pay

## 2022-02-06 NOTE — Telephone Encounter (Signed)
Patient notified and verbalized understanding. Patient had no questions or concerns at this time.  

## 2022-02-06 NOTE — Telephone Encounter (Signed)
-----   Message from Arnoldo Lenis, MD sent at 02/06/2022 10:52 AM EDT ----- Normal stress test, needs to f/u with pcp to consider noncardiac causes of pain   Zandra Abts MD

## 2022-02-13 ENCOUNTER — Ambulatory Visit (HOSPITAL_COMMUNITY)
Admission: RE | Admit: 2022-02-13 | Discharge: 2022-02-13 | Disposition: A | Discharge status: Home / Self Care | Payer: Medicare Other | Source: Ambulatory Visit | Attending: Urology | Admitting: Urology

## 2022-02-13 DIAGNOSIS — N132 Hydronephrosis with renal and ureteral calculous obstruction: Secondary | ICD-10-CM | POA: Insufficient documentation

## 2022-02-13 DIAGNOSIS — R109 Unspecified abdominal pain: Secondary | ICD-10-CM | POA: Diagnosis not present

## 2022-02-16 ENCOUNTER — Telehealth: Payer: Self-pay

## 2022-02-16 NOTE — Telephone Encounter (Signed)
-----   Message from Franchot Gallo, MD sent at 02/15/2022  1:52 PM EDT ----- Please call patient-great news.  CT scan looks great.  Best it has looked in a long time.  Kidneys are draining fine. ----- Message ----- From: Sherrilyn Rist, CMA Sent: 02/14/2022   4:38 PM EDT To: Franchot Gallo, MD  Please Review

## 2022-02-16 NOTE — Telephone Encounter (Signed)
Made patient aware that her CT scan looks great and her kidneys are draining fine. Patient voiced understanding.

## 2022-02-17 DIAGNOSIS — R0902 Hypoxemia: Secondary | ICD-10-CM | POA: Diagnosis not present

## 2022-02-18 ENCOUNTER — Other Ambulatory Visit: Payer: Self-pay | Admitting: Family Medicine

## 2022-02-18 ENCOUNTER — Other Ambulatory Visit: Payer: Self-pay | Admitting: Orthopedic Surgery

## 2022-02-18 DIAGNOSIS — M25512 Pain in left shoulder: Secondary | ICD-10-CM

## 2022-02-23 NOTE — Progress Notes (Signed)
[facility] Quality Team Note  Name: Cheryl Abbott Date of Birth: 12-23-1945 MRN: [id number] Date: 02/23/2022  [facility] Quality Team has reviewed this patient's chart, please see recommendations below:  [facility] Quality Other; (KED: Kidney Health Evaluation Gap- Patient needs Urine Albumin Creatinine Ratio Test completed for gap closure. EGFR has already been completed, Patient has upcoming appointment with [facility] 03/20/2022.)

## 2022-02-24 ENCOUNTER — Other Ambulatory Visit: Payer: Self-pay | Admitting: Family Medicine

## 2022-03-06 ENCOUNTER — Inpatient Hospital Stay: Payer: Medicare Other | Attending: Hematology

## 2022-03-06 DIAGNOSIS — D509 Iron deficiency anemia, unspecified: Secondary | ICD-10-CM | POA: Insufficient documentation

## 2022-03-06 DIAGNOSIS — D472 Monoclonal gammopathy: Secondary | ICD-10-CM | POA: Diagnosis not present

## 2022-03-06 LAB — CBC WITH DIFFERENTIAL/PLATELET
Abs Immature Granulocytes: 0.04 10*3/uL (ref 0.00–0.07)
Basophils Absolute: 0 10*3/uL (ref 0.0–0.1)
Basophils Relative: 1 %
Eosinophils Absolute: 0.2 10*3/uL (ref 0.0–0.5)
Eosinophils Relative: 2 %
HCT: 40.6 % (ref 36.0–46.0)
Hemoglobin: 13.3 g/dL (ref 12.0–15.0)
Immature Granulocytes: 1 %
Lymphocytes Relative: 19 %
Lymphs Abs: 1.5 10*3/uL (ref 0.7–4.0)
MCH: 32.8 pg (ref 26.0–34.0)
MCHC: 32.8 g/dL (ref 30.0–36.0)
MCV: 100 fL (ref 80.0–100.0)
Monocytes Absolute: 0.6 10*3/uL (ref 0.1–1.0)
Monocytes Relative: 8 %
Neutro Abs: 5.6 10*3/uL (ref 1.7–7.7)
Neutrophils Relative %: 69 %
Platelets: 272 10*3/uL (ref 150–400)
RBC: 4.06 MIL/uL (ref 3.87–5.11)
RDW: 15.2 % (ref 11.5–15.5)
WBC: 8 10*3/uL (ref 4.0–10.5)
nRBC: 0 % (ref 0.0–0.2)

## 2022-03-06 LAB — COMPREHENSIVE METABOLIC PANEL
ALT: 16 U/L (ref 0–44)
AST: 15 U/L (ref 15–41)
Albumin: 4.2 g/dL (ref 3.5–5.0)
Alkaline Phosphatase: 82 U/L (ref 38–126)
Anion gap: 9 (ref 5–15)
BUN: 14 mg/dL (ref 8–23)
CO2: 24 mmol/L (ref 22–32)
Calcium: 9.8 mg/dL (ref 8.9–10.3)
Chloride: 103 mmol/L (ref 98–111)
Creatinine, Ser: 1.69 mg/dL — ABNORMAL HIGH (ref 0.44–1.00)
GFR, Estimated: 31 mL/min — ABNORMAL LOW (ref >60)
Glucose, Bld: 116 mg/dL — ABNORMAL HIGH (ref 70–99)
Potassium: 3.9 mmol/L (ref 3.5–5.1)
Sodium: 136 mmol/L (ref 135–145)
Total Bilirubin: 0.4 mg/dL (ref 0.3–1.2)
Total Protein: 8.9 g/dL — ABNORMAL HIGH (ref 6.5–8.1)

## 2022-03-06 LAB — FERRITIN: Ferritin: 103 ng/mL (ref 11–307)

## 2022-03-06 LAB — IRON AND TIBC
Iron: 75 ug/dL (ref 28–170)
Saturation Ratios: 21 % (ref 10.4–31.8)
TIBC: 351 ug/dL (ref 250–450)
UIBC: 276 ug/dL

## 2022-03-06 LAB — LACTATE DEHYDROGENASE: LDH: 102 U/L (ref 98–192)

## 2022-03-07 ENCOUNTER — Ambulatory Visit (INDEPENDENT_AMBULATORY_CARE_PROVIDER_SITE_OTHER): Payer: Medicare Other | Admitting: Orthopedic Surgery

## 2022-03-07 ENCOUNTER — Ambulatory Visit: Payer: Medicare Other

## 2022-03-07 ENCOUNTER — Encounter: Payer: Self-pay | Admitting: Orthopedic Surgery

## 2022-03-07 ENCOUNTER — Ambulatory Visit (INDEPENDENT_AMBULATORY_CARE_PROVIDER_SITE_OTHER): Payer: Medicare Other

## 2022-03-07 DIAGNOSIS — M79671 Pain in right foot: Secondary | ICD-10-CM | POA: Diagnosis not present

## 2022-03-07 DIAGNOSIS — R600 Localized edema: Secondary | ICD-10-CM | POA: Diagnosis not present

## 2022-03-07 DIAGNOSIS — M79672 Pain in left foot: Secondary | ICD-10-CM

## 2022-03-07 DIAGNOSIS — L309 Dermatitis, unspecified: Secondary | ICD-10-CM | POA: Diagnosis not present

## 2022-03-07 LAB — KAPPA/LAMBDA LIGHT CHAINS
Kappa free light chain: 54.4 mg/L — ABNORMAL HIGH (ref 3.3–19.4)
Kappa, lambda light chain ratio: 3.06 — ABNORMAL HIGH (ref 0.26–1.65)
Lambda free light chains: 17.8 mg/L (ref 5.7–26.3)

## 2022-03-07 NOTE — Progress Notes (Signed)
Chief Complaint  Patient presents with   Ankle Problem    Bilateral, started itching and then had like bumps, hurting started using hydrocortisone cream and taking vitamin D the cream helped the itching, feet feels numb   76 year old female presents with itching both feet and ankle area, bilateral foot swelling, pain in her left great toe, bilateral foot pain.  No trauma has been reported  The swelling in the feet seems to resolve with elevation.  Review of Systems  Constitutional:  Negative for fever.  Musculoskeletal:  Positive for neck pain.  Skin:  Positive for itching and rash.  Neurological:  Positive for tingling.   Right Ankle Exam   Tenderness  Right ankle tenderness location: midfoot. Swelling: mild  Range of Motion  Dorsiflexion:  normal  Plantar flexion:  normal   Muscle Strength  Dorsiflexion:  5/5 Plantar flexion:  5/5  Tests  Anterior drawer: negative  Other  Erythema: absent Pulse: present    Left Ankle Exam   Tenderness  Left ankle tenderness location: midfoot.  Swelling: mild  Range of Motion  Dorsiflexion:  normal  Plantar flexion:  normal   Muscle Strength  Dorsiflexion:  5/5  Plantar flexion:  5/5   Tests  Anterior drawer: negative  Other  Scars: absent Pulse: present       The rash seems to be improving with the cortisone cream.  The patient has a flexible bunion deformity on the left foot which is tender.  She has some midfoot tenderness as well and mild ankle edema and foot edema   X-rays on the left and right foot do not reveal any major arthritic changes she does have the bunion deformities with a hallux valgus angle of approximately 18 to 19 degrees  Encounter Diagnoses  Name Primary?   Bilateral foot pain Yes   Dermatitis    Localized edema     Recommendations are for her to continue the cortisone cream, use foot orthotics and TED hose

## 2022-03-07 NOTE — Patient Instructions (Signed)
Itching use the cortisone cream, see your doctor if it persists  2. Swelling wear TED hose and elevate feet   3. Foot pain wear orthotics

## 2022-03-08 LAB — PROTEIN ELECTROPHORESIS, SERUM
A/G Ratio: 1 (ref 0.7–1.7)
Albumin ELP: 4.1 g/dL (ref 2.9–4.4)
Alpha-1-Globulin: 0.2 g/dL (ref 0.0–0.4)
Alpha-2-Globulin: 0.9 g/dL (ref 0.4–1.0)
Beta Globulin: 1 g/dL (ref 0.7–1.3)
Gamma Globulin: 2 g/dL — ABNORMAL HIGH (ref 0.4–1.8)
Globulin, Total: 4 g/dL — ABNORMAL HIGH (ref 2.2–3.9)
M-Spike, %: 1.3 g/dL — ABNORMAL HIGH
Total Protein ELP: 8.1 g/dL (ref 6.0–8.5)

## 2022-03-13 ENCOUNTER — Ambulatory Visit (HOSPITAL_COMMUNITY): Payer: Medicare Other | Admitting: Physician Assistant

## 2022-03-13 NOTE — Progress Notes (Unsigned)
[place], [place] [address]   CLINIC:  Medical Oncology/Hematology  PCP:  [name], MD [address] [place] [address] [phone]   REASON FOR VISIT:  Follow-up for IgG kappa smoldering myeloma & iron deficiency anemia   CURRENT THERAPY: Intermittent IV iron infusions (last in July 2023)   INTERVAL HISTORY:  Cheryl Abbott 76 y.o. female returns for routine follow-up of her IgG kappa smoldering myeloma and her iron deficiency anemia.  She was last seen by Tarri Abernethy PA-C on 12/25/2021.  At today's visit, she reports feeling at her usual baseline.*** No recent hospitalizations, surgeries, or changes in baseline health status.  Regarding her smoldering myeloma, she has not noted any new symptoms.*** She continues to have pain in her left shoulder which she says she has seen neurosurgeon for and is related to a pinched nerve in her neck.  *** *** She denies any new bone pain or fractures.  *** *** Her night sweats have resolved - she denies any fever, chills, or unintentional weight loss. *** She admits to tinnitus as well as numbness in her feet.    *** No new onset hearing loss, blurred vision, headache, or dizziness.  No thromboembolic events.  No new masses or lymphadenopathy.  Regarding her iron deficiency anemia, she reports that she felt slightly better after her IV iron in July 2023.  *** *** She did have gross hematuria for about 36 hours following ureteral stent removal in June 2023, but has not had any recurrent hematuria.  *** *** She denies any recent rectal bleeding, but has had scant hemorrhoidal bleeding in the past. She denies any  hematemesis, melena, or epistaxis. *** She is symptomatic with ongoing fatigue that was slightly improved by last IV iron. *** She has symptoms of restless legs and dyspnea on exertion. *** No pica, chest pain, lightheadedness, or syncope.  She has***% energy and***%  appetite.  She endorses that she is maintaining a stable weight.   REVIEW OF SYSTEMS:  *** Review of Systems  Constitutional:  Positive for fatigue. Negative for appetite change, chills, diaphoresis, fever and unexpected weight change.  HENT:   Positive for trouble swallowing. Negative for lump/mass and nosebleeds.   Eyes:  Negative for eye problems.  Respiratory:  Positive for shortness of breath. Negative for cough and hemoptysis.   Cardiovascular:  Positive for chest pain (last week, none today). Negative for leg swelling and palpitations.  Gastrointestinal:  Positive for constipation, nausea and vomiting. Negative for abdominal pain, blood in stool and diarrhea.  Genitourinary:  Positive for frequency. Negative for hematuria.   Musculoskeletal:  Positive for arthralgias.  Skin: Negative.   Neurological:  Negative for dizziness, headaches and light-headedness.  Hematological:  Does not bruise/bleed easily.  Psychiatric/Behavioral:  Positive for sleep disturbance. The patient is nervous/anxious (at times).       PAST MEDICAL/SURGICAL HISTORY:  Past Medical History:  Diagnosis Date   Anxiety disorder    Arthritis    Chronic back pain    Chronic pain of both shoulders    per pt told due to cervical spine pinched nerve   CKD (chronic kidney disease), stage III [facility])    nephrologist--- dr Theador Hawthorne;   due to tubular necrosis   Claustrophobia    Depression    Edema of both lower extremities    GERD (gastroesophageal reflux disease)    Headache    Hemorrhoids    w/ intermittant bleeding  History of adenomatous polyp of colon    History of kidney stones    Hx-TIA (transient ischemic attack) 02/26/2016   per pt no residual   Hydronephrosis, left    followed by urologist--- dr Diona Fanti;   persistant   Hyperlipidemia    Hypertension    Iron deficiency    followed by [facility] w/ hx iron infusions   Kappa light chain myeloma ([place]) 02/2017   oncologist--- [facility];  dx 09/  2018 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, active survillence   Lactose intolerance in adult 02/01/2016   Left ureteral calculus    Lumbar disc disease with radiculopathy    Nephrolithiasis    per CT 08-11-2021  left > right   Neuropathy, peripheral    Nocturia more than twice per night    Normocytic anemia    OSA (obstructive sleep apnea)    per study 2007;   per pt no Bipap use since 2021   Renal atrophy, bilateral    Renal cyst, right    Sciatica    Sigmoid diverticulosis    Type 2 diabetes mellitus ([place])    Past Surgical History:  Procedure Laterality Date   BIOPSY N/A 03/17/2013   Procedure: GASTRIC BIOPSIES;  Surgeon: Danie Binder, MD;  [facility]: [facility];  Service: Endoscopy;  Laterality: N/A;   BIOPSY  06/10/2015   Procedure: BIOPSY;  Surgeon: Danie Binder, MD;  [facility];  Service: Endoscopy;;  gastric biopsy   CARDIAC CATHETERIZATION  05/11/2003   @ [facility] by dr t. Claiborne Billings;  Abnormal cardiolite/   Normal coronary arteries and normal LVF,  ef  63%   CARDIOVASCULAR STRESS TEST  01/01/2014   normal lexiscan cardiolite/  no ischemia/ infarct/  normal LV function and wall motion ,  ef 81%   COLONOSCOPY WITH PROPOFOL N/A 02/26/2017   Procedure: COLONOSCOPY WITH PROPOFOL;  Surgeon: Danie Binder, MD;  [facility];  Service: Endoscopy;  Laterality: N/A;  11:00am   COLONOSCOPY WITH PROPOFOL N/A 09/22/2019   Procedure: COLONOSCOPY WITH PROPOFOL;  Surgeon: Danie Binder, MD;  [facility];  Service: Endoscopy;  Laterality: N/A;  1:15   CYSTO/   RIGHT URETEROSOCOPY LASER LITHOTRIPSY STONE EXTRACTION  10/13/2004   CYSTO/  RIGHT RETROGRADE PYELOGRAM/  PLACMENT RIGHT URETERAL STENT  01/10/2010   CYSTOSCOPY W/ URETERAL STENT PLACEMENT Right 08/19/2015   Procedure: CYSTOSCOPY WITH RETROGRADE PYELOGRAM/URETERAL STENT PLACEMENT;  Surgeon: Franchot Gallo, MD;  Location[facility];  Service: Urology;  Laterality:  Right;   CYSTOSCOPY W/ URETERAL STENT PLACEMENT Left 02/23/2020   Procedure: CYSTOSCOPY LEFT  RETROGRADE PYELOGRAM LEFT URETEROSCOPY URETERAL STENT PLACEMENT;  Surgeon: Franchot Gallo, MD;  Location[facility];  Service: Urology;  Laterality: Left;   CYSTOSCOPY WITH RETROGRADE PYELOGRAM, URETEROSCOPY AND STENT PLACEMENT Left 10/21/2014   Procedure: CYSTOSCOPY WITH RETROGRADE PYELOGRAM, URETEROSCOPY AND STENT PLACEMENT;  Surgeon: Franchot Gallo, MD;  Location: [facility];  Service: Urology;  Laterality: Left;   CYSTOSCOPY WITH RETROGRADE PYELOGRAM, URETEROSCOPY AND STENT PLACEMENT Right 11/22/2015   Procedure: CYSTOSCOPY, RIGHT URETERAL STENT REMOVAL; RIGHT RETROGRADE PYELOGRAM, RIGHT URETEROSCOPY WITH BALLOON DILATION; RIGHT URETERAL STENT PLACEMENT;  Surgeon: Franchot Gallo, MD;  [facility];  Service: Urology;  Laterality: Right;   CYSTOSCOPY WITH RETROGRADE PYELOGRAM, URETEROSCOPY AND STENT PLACEMENT Left 10/18/2020   Procedure: CYSTOSCOPY WITH LEFT RETROGRADE PYELOGRAM, LEFT URETEROSCOPY  WITH LASER AND LEFT URETERAL STENT PLACEMENT;  Surgeon: Franchot Gallo, MD;  [facility];  Service: Urology;  Laterality: Left;   CYSTOSCOPY WITH RETROGRADE PYELOGRAM, URETEROSCOPY AND STENT PLACEMENT Left 10/26/2021   Procedure: CYSTOSCOPY WITH RETROGRADE PYELOGRAM, URETEROSCOPY, STONE EXTRACTION  AND STENT PLACEMENT;  Surgeon: Franchot Gallo, MD;  [facility];  Service: Urology;  Laterality: Left;   CYSTOSCOPY/RETROGRADE/URETEROSCOPY/STONE EXTRACTION WITH BASKET Bilateral 08/02/2015   Procedure: CYSTOSCOPY; BILATERAL RETROGRADE PYELOGRAMS; BILATERAL URETEROSCOPY, STONE EXTRACTION WITH BASKET;  Surgeon: Franchot Gallo, MD;  [facility];  Service: Urology;  Laterality: Bilateral;   CYSTOSCOPY/RETROGRADE/URETEROSCOPY/STONE EXTRACTION WITH BASKET Right 06/28/2015   Procedure: CYSTOSCOPY/RETROGRADE/URETEROSCOPY/STONE EXTRACTION WITH BASKET,  RIGHT URETERAL DOUBLE J STENT PLACEMENT;  Surgeon: Franchot Gallo, MD;  [facility];  Service: Urology;  Laterality: Right;   ESOPHAGOGASTRODUODENOSCOPY (EGD) WITH PROPOFOL N/A 03/17/2013   Procedure: ESOPHAGOGASTRODUODENOSCOPY (EGD) WITH PROPOFOL;  Surgeon: Danie Binder, MD;  [facility];  Service: Endoscopy;  Laterality: N/A;   ESOPHAGOGASTRODUODENOSCOPY (EGD) WITH PROPOFOL N/A 06/10/2015   Distal gastritis, distal esophageal stricture s/p dilation   ESOPHAGOGASTRODUODENOSCOPY (EGD) WITH PROPOFOL N/A 02/26/2017   Procedure: ESOPHAGOGASTRODUODENOSCOPY (EGD) WITH PROPOFOL;  Surgeon: Danie Binder, MD;  [facility];  Service: Endoscopy;  Laterality: N/A;   FLEXIBLE SIGMOIDOSCOPY  09/11/2011   [id number] Internal hemorrhoids   HOLMIUM LASER APPLICATION Left 25/95/6387   Procedure: HOLMIUM LASER APPLICATION;  Surgeon: Franchot Gallo, MD;  Location[facility];  Service: Urology;  Laterality: Left;   HOLMIUM LASER APPLICATION Right 56/43/3295   Procedure: HOLMIUM LASER APPLICATION;  Surgeon: Franchot Gallo, MD;  [facility];  Service: Urology;  Laterality: Right;   HOLMIUM LASER APPLICATION  18/84/1660   Procedure: HOLMIUM LASER APPLICATION;  Surgeon: Franchot Gallo, MD;  [facility];  Service: Urology;;   HOLMIUM LASER APPLICATION Left 63/06/6008   Procedure: HOLMIUM LASER APPLICATION;  Surgeon: Franchot Gallo, MD;  [facility];  Service: Urology;  Laterality: Left;   MASS EXCISION Left 03/04/2015   Procedure: EXCISION OF SOFT TISSUE NEOPLASM LEFT ARM;  Surgeon: Aviva Signs, MD;  Location[facility];  Service: General;  Laterality: Left;   PERCUTANEOUS NEPHROSTOLITHOTOMY Bilateral 05/2010   '@[facility]'$    POLYPECTOMY N/A 03/17/2013   Procedure: GASTRIC POLYPECTOMY;  Surgeon: Danie Binder, MD;  [facility];  Service: Endoscopy;  Laterality: N/A;   POLYPECTOMY  02/26/2017   Procedure: POLYPECTOMY;  Surgeon: Danie Binder,  MD;  [facility];  Service: Endoscopy;;  polyp at cecum, ascending colon polyps x3, hepatic flexure polyps x8, transverse colon polyps x8    POLYPECTOMY  09/22/2019   Procedure: POLYPECTOMY;  Surgeon: Danie Binder, MD;  [facility];  Service: Endoscopy;;   REMOVAL RIGHT THIGH CYST  2006   SAVORY DILATION N/A 03/17/2013   Procedure: SAVORY DILATION;  Surgeon: Danie Binder, MD;  [facility];  Service: Endoscopy;  Laterality: N/A;  #12.8, 14, 15, 16 dilators used   SAVORY DILATION N/A 06/10/2015   Procedure: SAVORY DILATION;  Surgeon: Danie Binder, MD;  [facility];  Service: Endoscopy;  Laterality: N/A;   VAGINAL HYSTERECTOMY  1970's     SOCIAL HISTORY:  Social History   Socioeconomic History   Marital status: Divorced    Spouse name: Not on file   Number of children: 2   Years of education: Not on file   Highest education level: Not on file  Occupational History   Occupation: retired   Tobacco Use   Smoking status: Former  Packs/day: 1.00    Years: 20.00    Total pack years: 20.00    Types: Cigarettes    Start date: 09/21/1974    Quit date: 09/20/1988    Years since quitting: 33.4   Smokeless tobacco: Never  Vaping Use   Vaping Use: Never used  Substance and Sexual Activity   Alcohol use: No    Alcohol/week: 0.0 standard drinks of alcohol   Drug use: No   Sexual activity: Never    Birth control/protection: Surgical  Other Topics Concern   Not on file  Social History Narrative   Patient is right handed   Patient drinks some caffeine daily.   Social Determinants of Health   Financial Resource Strain: Low Risk  (04/17/2021)   Overall Financial Resource Strain (CARDIA)    Difficulty of Paying Living Expenses: Not hard at all  Food Insecurity: No Food Insecurity (04/17/2021)   Hunger Vital Sign    Worried About Running Out of Food in the Last Year: Never true    Ran Out of Food in the Last Year: Never true   Transportation Needs: No Transportation Needs (04/17/2021)   PRAPARE - Hydrologist (Medical): No    Lack of Transportation (Non-Medical): No  Physical Activity: Inactive (04/17/2021)   Exercise Vital Sign    Days of Exercise per Week: 0 days    Minutes of Exercise per Session: 0 min  Stress: No Stress Concern Present (04/17/2021)   [place]    Feeling of Stress : Not at all  Social Connections: Moderately Isolated (04/17/2021)   Social Connection and Isolation Panel [NHANES]    Frequency of Communication with Friends and Family: More than three times a week    Frequency of Social Gatherings with Friends and Family: Never    Attends Religious Services: More than 4 times per year    Active Member of [employer] or Organizations: No    Attends Archivist Meetings: Never    Marital Status: Divorced  Human resources officer Violence: Not At Risk (04/17/2021)   Humiliation, Afraid, Rape, and Kick questionnaire    Fear of Current or Ex-Partner: No    Emotionally Abused: No    Physically Abused: No    Sexually Abused: No    FAMILY HISTORY:  Family History  Problem Relation Age of Onset   Hypertension Mother    Diabetes Mother    Heart failure Mother    Dementia Mother    Emphysema Father    Hypertension Father    Diabetes Brother    GER disease Brother    Hypertension Sister    Hypertension Sister    Cancer Other        family history    Diabetes Other        family history    Heart defect Other        famiily history    Arthritis Other        family history    Anesthesia problems Neg Hx    Hypotension Neg Hx    Malignant hyperthermia Neg Hx    Pseudochol deficiency Neg Hx    Colon cancer Neg Hx     CURRENT MEDICATIONS:  Outpatient Encounter Medications as of 03/14/2022  Medication Sig   acetaminophen (TYLENOL) 500 MG tablet Take 1,000 mg by mouth every 6 (six) hours as needed  for mild pain.   amLODipine (NORVASC) 10 MG tablet TAKE 1 TABLET  BY MOUTH EVERY DAY   bisacodyl (DULCOLAX) 10 MG suppository Place 1 suppository (10 mg total) rectally as needed for moderate constipation.   bisacodyl (DULCOLAX) 5 MG EC tablet Take 5 mg by mouth daily as needed for moderate constipation.   diclofenac Sodium (VOLTAREN) 1 % GEL APPLY 4 G TOPICALLY 4 (FOUR) TIMES DAILY AS NEEDED (PAIN).   diphenhydramine-acetaminophen (TYLENOL PM) 25-500 MG TABS tablet Take 2 tablets by mouth at bedtime.   docusate sodium (COLACE) 100 MG capsule Take 1 capsule (100 mg total) by mouth 2 (two) times daily. (Patient taking differently: Take 100 mg by mouth 2 (two) times daily as needed.)   Fluticasone-Umeclidin-Vilant (TRELEGY ELLIPTA) 100-62.5-25 MCG/ACT AEPB Inhale 1 puff into the lungs daily.   gabapentin (NEURONTIN) 100 MG capsule Take 100 mg by mouth daily. (Patient not taking: Reported on 01/26/2022)   gabapentin (NEURONTIN) 300 MG capsule Take 300 mg by mouth at bedtime.   nitroGLYCERIN (NITROSTAT) 0.4 MG SL tablet Place 1 tablet (0.4 mg total) under the tongue every 5 (five) minutes as needed for chest pain. Up to 3 tablets, then call 911.   OneTouch Delica Lancets 47M MISC Once daily testing dx e11.9   ONETOUCH ULTRA test strip USE AS INSTRUCTED ONCE DAILY DX E11.9   oxybutynin (DITROPAN) 5 MG tablet 1 tab po q 6 hrs prn stent discomfort (Patient not taking: Reported on 01/26/2022)   pantoprazole (PROTONIX) 40 MG tablet TAKE 1 TABLET BY MOUTH TWICE A DAY   PARoxetine (PAXIL) 40 MG tablet Take 1 tablet (40 mg total) by mouth in the morning.   rosuvastatin (CRESTOR) 20 MG tablet TAKE 1 TABLET BY MOUTH EVERY DAY (Patient taking differently: Take 20 mg by mouth.)   SYMBICORT 80-4.5 MCG/ACT inhaler Inhale 2 puffs into the lungs 2 (two) times daily.   TRADJENTA 5 MG TABS tablet TAKE 1 TABLET (5 MG TOTAL) BY MOUTH DAILY.   traMADol (ULTRAM) 50 MG tablet TAKE 1 TABLET BY MOUTH EVERY 6 HOURS AS NEEDED    triamcinolone cream (KENALOG) 0.1 % Apply 1 application topically 2 (two) times daily. (Patient taking differently: Apply 1 application  topically 2 (two) times daily as needed.)   TRULICITY 5.46 TK/3.5WS SOPN INJECT 0.75 MG INTO THE SKIN WEEKLY   UNABLE TO FIND Diabetic shoes x 1 pair, inserts x 3 pair  DX E11.9   No facility-administered encounter medications on file as of 03/14/2022.    ALLERGIES:  Allergies  Allergen Reactions   Ace Inhibitors Cough    Patient doesn't recall   Keflex [Cephalexin] Diarrhea and Nausea And Vomiting   Nitrofurantoin Diarrhea and Nausea And Vomiting   Penicillins Hives and Other (See Comments)    Reaction:  Blisters on hands/feet  Has patient had a PCN reaction causing immediate rash, facial/tongue/throat swelling, SOB or lightheadedness with hypotension: No Has patient had a PCN reaction causing severe rash involving mucus membranes or skin necrosis: No Has patient had a PCN reaction that required hospitalization No Has patient had a PCN reaction occurring within the last 10 years: No If all of the above answers are "NO", then may proceed with Cephalosporin use.     PHYSICAL EXAM: *** ECOG PERFORMANCE STATUS: 2 - Symptomatic, <50% confined to bed    There were no vitals filed for this visit. There were no vitals filed for this visit. Physical Exam Constitutional:      Appearance: Normal appearance. She is obese.  HENT:     Head: Normocephalic and atraumatic.  Mouth/Throat:     Mouth: Mucous membranes are moist.  Eyes:     Extraocular Movements: Extraocular movements intact.     Pupils: Pupils are equal, round, and reactive to light.  Cardiovascular:     Rate and Rhythm: Normal rate and regular rhythm.     Pulses: Normal pulses.     Heart sounds: Normal heart sounds.  Pulmonary:     Effort: Pulmonary effort is normal.     Breath sounds: No wheezing.  Abdominal:     General: Bowel sounds are normal.     Palpations: Abdomen is soft.      Tenderness: There is no abdominal tenderness.  Musculoskeletal:        General: No swelling.     Right lower leg: No edema.     Left lower leg: No edema.  Lymphadenopathy:     Cervical: No cervical adenopathy.  Skin:    General: Skin is warm and dry.  Neurological:     General: No focal deficit present.     Mental Status: She is alert and oriented to person, place, and time.  Psychiatric:        Mood and Affect: Mood normal.        Behavior: Behavior normal.     LABORATORY DATA:  I have reviewed the labs as listed.  CBC    Component Value Date/Time   WBC 8.0 03/06/2022 1104   RBC 4.06 03/06/2022 1104   HGB 13.3 03/06/2022 1104   HCT 40.6 03/06/2022 1104   PLT 272 03/06/2022 1104   MCV 100.0 03/06/2022 1104   MCH 32.8 03/06/2022 1104   MCHC 32.8 03/06/2022 1104   RDW 15.2 03/06/2022 1104   LYMPHSABS 1.5 03/06/2022 1104   MONOABS 0.6 03/06/2022 1104   EOSABS 0.2 03/06/2022 1104   BASOSABS 0.0 03/06/2022 1104      Latest Ref Rng & Units 03/06/2022   11:04 AM 11/29/2021   11:25 AM 11/19/2021    8:02 PM  CMP  Glucose 70 - 99 mg/dL 116  107  107   BUN 8 - 23 mg/dL '14  22  18   '$ Creatinine 0.44 - 1.00 mg/dL 1.69  1.87  1.71   Sodium 135 - 145 mmol/L 136  134  138   Potassium 3.5 - 5.1 mmol/L 3.9  3.7  3.8   Chloride 98 - 111 mmol/L 103  103  109   CO2 22 - 32 mmol/L '24  23  24   '$ Calcium 8.9 - 10.3 mg/dL 9.8  9.9  10.4   Total Protein 6.5 - 8.1 g/dL 8.9  8.5    Total Bilirubin 0.3 - 1.2 mg/dL 0.4  0.4    Alkaline Phos 38 - 126 U/L 82  74    AST 15 - 41 U/L 15  19    ALT 0 - 44 U/L 16  16      DIAGNOSTIC IMAGING:  I have independently reviewed the relevant imaging and discussed with the patient.  ASSESSMENT & PLAN: 1.  IgG kappa smoldering myeloma: - Diagnosed in September 2018 after nephrology work-up found M spike in urine (8.3 mg per 24 hours) and serum (1.5 g/dL) - Immunofixation shows IgG monoclonal protein with kappa light chain specificity - Bone marrow  biopsy on 02/19/2017 showed trilineage hematopoiesis, plasma cells 14%, cytogenetics normal, FISH panel normal - Skeletal survey on 02/01/2020 did not show any lytic lesions. - Skeletal survey (02/16/2021): 2 adjacent 4 mm lucencies on the right scapula  at the base of the glenoid, which were not seen on prior exam.  No other focal bone lesion. - Scapula x-ray (02/21/2021): Small lucent lesions in the scapula at the base of the glenoid, better seen on skeletal survey - Skeletal survey (11/29/2021): Stable lucencies in scapula on the right, no new lytic or destructive lesion is seen. - Most recent myeloma panel (03/06/2022): M spike 1.3, stable within patient's baseline range.  Stable light chains with elevated kappa 54.4, normal lambda, elevated ratio 3.06.  LDH normal. - Additional labs (03/06/2022): Creatinine 1.69 (baseline CKD stage IIIb/IV), normal calcium 9.8, normal Hgb 13.3. - No B symptoms or new bone pain.  Chronic right leg pain, chronic left shoulder pain from pinched nerve in neck*** - Per [facility] 20/20/2 guidelines, patient is not high risk SMM, no treatment indicated at this time, only continued close observation - PLAN: Repeat myeloma panel and office visit in 3 months*** -- Next skeletal survey due in June 2024  2.  Normocytic anemia with iron deficiency: - She had a colonoscopy on 09/22/2019 which showed polyps, diverticulosis, and external and internal hemorrhoids.  EGD (02/26/2017) with small hiatal hernia and mild gastritis. - Has required intermittent IV iron infusions (unable to tolerate oral iron due to severe constipation); last IV iron with Feraheme x2 on 12/18/2021 and 12/25/2021 - Most recent labs (03/06/2022): Hgb 13.3, ferritin 103, iron saturation 21%. - Bleeding *** - Symptomatic with chronic fatigue and dyspnea on exertion   *** - PLAN: No indication for IV iron at this time.  We will repeat labs in 3 months and reassess at return visit.   All questions were answered. The  patient knows to call the clinic with any problems, questions or concerns.  Medical decision making: Moderate  ***  Time spent on visit: I spent *** minutes counseling the patient face to face. The total time spent in the appointment was *** minutes and more than 50% was on counseling.   Harriett Rush, PA-C  ***

## 2022-03-14 ENCOUNTER — Inpatient Hospital Stay: Payer: Medicare Other | Attending: Physician Assistant | Admitting: Physician Assistant

## 2022-03-14 VITALS — BP 123/73 | HR 89 | Temp 99.0°F | Resp 18 | Ht 66.0 in | Wt 186.1 lb

## 2022-03-14 DIAGNOSIS — Z87442 Personal history of urinary calculi: Secondary | ICD-10-CM | POA: Insufficient documentation

## 2022-03-14 DIAGNOSIS — Z86010 Personal history of colonic polyps: Secondary | ICD-10-CM | POA: Insufficient documentation

## 2022-03-14 DIAGNOSIS — I129 Hypertensive chronic kidney disease with stage 1 through stage 4 chronic kidney disease, or unspecified chronic kidney disease: Secondary | ICD-10-CM | POA: Diagnosis not present

## 2022-03-14 DIAGNOSIS — K219 Gastro-esophageal reflux disease without esophagitis: Secondary | ICD-10-CM | POA: Diagnosis not present

## 2022-03-14 DIAGNOSIS — G8929 Other chronic pain: Secondary | ICD-10-CM | POA: Diagnosis not present

## 2022-03-14 DIAGNOSIS — Z8673 Personal history of transient ischemic attack (TIA), and cerebral infarction without residual deficits: Secondary | ICD-10-CM | POA: Diagnosis not present

## 2022-03-14 DIAGNOSIS — Z79899 Other long term (current) drug therapy: Secondary | ICD-10-CM | POA: Diagnosis not present

## 2022-03-14 DIAGNOSIS — Z87891 Personal history of nicotine dependence: Secondary | ICD-10-CM | POA: Diagnosis not present

## 2022-03-14 DIAGNOSIS — R609 Edema, unspecified: Secondary | ICD-10-CM | POA: Insufficient documentation

## 2022-03-14 DIAGNOSIS — D472 Monoclonal gammopathy: Secondary | ICD-10-CM | POA: Diagnosis not present

## 2022-03-14 DIAGNOSIS — N183 Chronic kidney disease, stage 3 unspecified: Secondary | ICD-10-CM | POA: Insufficient documentation

## 2022-03-14 DIAGNOSIS — D509 Iron deficiency anemia, unspecified: Secondary | ICD-10-CM | POA: Diagnosis not present

## 2022-03-14 DIAGNOSIS — E785 Hyperlipidemia, unspecified: Secondary | ICD-10-CM | POA: Insufficient documentation

## 2022-03-14 DIAGNOSIS — C9000 Multiple myeloma not having achieved remission: Secondary | ICD-10-CM | POA: Diagnosis not present

## 2022-03-14 NOTE — Patient Instructions (Signed)
[place] at [facility] Discharge Instructions  You were seen today by Tarri Abernethy PA-C for your iron deficiency anemia and your smoldering myeloma.  IRON DEFICIENCY: Your blood and iron levels look great today!  We will recheck your levels at your follow-up visit in 3 months.  SMOLDERING MYELOMA: You continue to be in the "gray area" between normal blood and full-blown blood cancer (multiple myeloma).  We will continue to monitor your labs closely, and we will see you for follow-up in 3 months with a full lab panel.  LABS: Return in 3 months for repeat labs  FOLLOW-UP APPOINTMENT: Office visit in 3 months, after labs   - - - - - - - - - - - - - - - - - -    Thank you for choosing [place] at [facility] to provide your oncology and hematology care.  To afford each patient quality time with our provider, please arrive at least 15 minutes before your scheduled appointment time.   If you have a lab appointment with the [place] please come in thru the Main Entrance and check in at the main information desk.  You need to re-schedule your appointment should you arrive 10 or more minutes late.  We strive to give you quality time with our providers, and arriving late affects you and other patients whose appointments are after yours.  Also, if you no show three or more times for appointments you may be dismissed from the clinic at the providers discretion.     Again, thank you for choosing [facility].  Our hope is that these requests will decrease the amount of time that you wait before being seen by our physicians.       _____________________________________________________________  Should you have questions after your visit to [facility], please contact our office at [phone] and follow the prompts.  Our office hours are 8:00 a.m. and 4:30 p.m. Monday - Friday.  Please note that voicemails left after  4:00 p.m. may not be returned until the following business day.  We are closed weekends and major holidays.  You do have access to a nurse 24-7, just call the main number to the clinic [phone] and do not press any options, hold on the line and a nurse will answer the phone.    For prescription refill requests, have your pharmacy contact our office and allow 72 hours.    Due to Covid, you will need to wear a mask upon entering the hospital. If you do not have a mask, a mask will be given to you at the Main Entrance upon arrival. For doctor visits, patients may have 1 support person age 14 or older with them. For treatment visits, patients can not have anyone with them due to social distancing guidelines and our immunocompromised population.

## 2022-03-19 ENCOUNTER — Other Ambulatory Visit: Payer: Self-pay | Admitting: Family Medicine

## 2022-03-19 DIAGNOSIS — R0902 Hypoxemia: Secondary | ICD-10-CM | POA: Diagnosis not present

## 2022-03-20 ENCOUNTER — Encounter: Payer: Self-pay | Admitting: Family Medicine

## 2022-03-20 ENCOUNTER — Ambulatory Visit (INDEPENDENT_AMBULATORY_CARE_PROVIDER_SITE_OTHER): Payer: Medicare Other | Admitting: Family Medicine

## 2022-03-20 VITALS — BP 119/78 | HR 96 | Ht 66.0 in | Wt 187.0 lb

## 2022-03-20 DIAGNOSIS — E1121 Type 2 diabetes mellitus with diabetic nephropathy: Secondary | ICD-10-CM | POA: Diagnosis not present

## 2022-03-20 DIAGNOSIS — Z8739 Personal history of other diseases of the musculoskeletal system and connective tissue: Secondary | ICD-10-CM

## 2022-03-20 DIAGNOSIS — F32A Depression, unspecified: Secondary | ICD-10-CM

## 2022-03-20 DIAGNOSIS — R0902 Hypoxemia: Secondary | ICD-10-CM

## 2022-03-20 DIAGNOSIS — Z1231 Encounter for screening mammogram for malignant neoplasm of breast: Secondary | ICD-10-CM | POA: Diagnosis not present

## 2022-03-20 DIAGNOSIS — I10 Essential (primary) hypertension: Secondary | ICD-10-CM

## 2022-03-20 DIAGNOSIS — I739 Peripheral vascular disease, unspecified: Secondary | ICD-10-CM

## 2022-03-20 DIAGNOSIS — C9000 Multiple myeloma not having achieved remission: Secondary | ICD-10-CM | POA: Diagnosis not present

## 2022-03-20 DIAGNOSIS — E785 Hyperlipidemia, unspecified: Secondary | ICD-10-CM

## 2022-03-20 DIAGNOSIS — Z780 Asymptomatic menopausal state: Secondary | ICD-10-CM

## 2022-03-20 DIAGNOSIS — G8929 Other chronic pain: Secondary | ICD-10-CM

## 2022-03-20 DIAGNOSIS — M25512 Pain in left shoulder: Secondary | ICD-10-CM | POA: Diagnosis not present

## 2022-03-20 DIAGNOSIS — E669 Obesity, unspecified: Secondary | ICD-10-CM

## 2022-03-20 DIAGNOSIS — F419 Anxiety disorder, unspecified: Secondary | ICD-10-CM

## 2022-03-20 DIAGNOSIS — D126 Benign neoplasm of colon, unspecified: Secondary | ICD-10-CM | POA: Diagnosis not present

## 2022-03-20 DIAGNOSIS — E559 Vitamin D deficiency, unspecified: Secondary | ICD-10-CM

## 2022-03-20 DIAGNOSIS — E66811 Obesity, class 1: Secondary | ICD-10-CM

## 2022-03-20 NOTE — Patient Instructions (Addendum)
Keep January appointment, call uif you need me sooner  Please schedule dec 31 or after mammogram at checkout  You are referred for bone density testing  You are referred for Shoulder to Dr Aline Brochure, and also to gI re colonoscopy due to multiple polyps  You are referred to vascular surgeon to assess circulation in feet  Use Nivea lotion to moisturize your legs  Nurse pls give rx for TdaP for her to take to the Pharmacy, needs that   Microalb today'  Fasting lipid, cmp and eGFr, hBA1C, tSH and vit d  today if fasting, if not later this week if possible  Thanks for choosing [facility], we consider it a privelige to serve you.

## 2022-03-20 NOTE — Assessment & Plan Note (Signed)
increased pain and debility , 8 to 10 in past 2 months, refer Ortho

## 2022-03-21 ENCOUNTER — Telehealth: Payer: Self-pay | Admitting: Family Medicine

## 2022-03-21 ENCOUNTER — Other Ambulatory Visit: Payer: Self-pay | Admitting: Family Medicine

## 2022-03-21 ENCOUNTER — Ambulatory Visit (HOSPITAL_COMMUNITY)
Admission: RE | Admit: 2022-03-21 | Discharge: 2022-03-21 | Disposition: A | Discharge status: Home / Self Care | Payer: Medicare Other | Source: Ambulatory Visit | Attending: Family Medicine | Admitting: Family Medicine

## 2022-03-21 ENCOUNTER — Other Ambulatory Visit: Payer: Self-pay

## 2022-03-21 DIAGNOSIS — R0902 Hypoxemia: Secondary | ICD-10-CM

## 2022-03-21 DIAGNOSIS — E785 Hyperlipidemia, unspecified: Secondary | ICD-10-CM | POA: Diagnosis not present

## 2022-03-21 DIAGNOSIS — I10 Essential (primary) hypertension: Secondary | ICD-10-CM | POA: Diagnosis not present

## 2022-03-21 DIAGNOSIS — E1121 Type 2 diabetes mellitus with diabetic nephropathy: Secondary | ICD-10-CM | POA: Diagnosis not present

## 2022-03-21 DIAGNOSIS — I739 Peripheral vascular disease, unspecified: Secondary | ICD-10-CM | POA: Insufficient documentation

## 2022-03-21 DIAGNOSIS — E559 Vitamin D deficiency, unspecified: Secondary | ICD-10-CM | POA: Diagnosis not present

## 2022-03-21 NOTE — Assessment & Plan Note (Signed)
Managed by oncology, stable

## 2022-03-21 NOTE — Assessment & Plan Note (Signed)
Reports difficulty breathing at night and fatigue, oxygen at visit low, arrange overnight study and obtain cXR

## 2022-03-21 NOTE — Telephone Encounter (Signed)
C/o difficulty breathing at night and oxygen was90 % at visit, pls refer for overnight pulse ox, and also let her know I have ordered a CXR which she should get in the next 1 week Has a h/o sleep apnea, could not tolerate CPAP mask, may be better able to use updated equipment, if she is agreeable, pls refer her to pulmonary in [place] for eval , needs management, this is important

## 2022-03-21 NOTE — Telephone Encounter (Signed)
LMTRC

## 2022-03-21 NOTE — Assessment & Plan Note (Signed)
  Patient re-educated about  the importance of commitment to a  minimum of 150 minutes of exercise per week as able.  The importance of healthy food choices with portion control discussed, as well as eating regularly and within a 12 hour window most days. The need to choose "clean , green" food 50 to 75% of the time is discussed, as well as to make water the primary drink and set a goal of 64 ounces water daily.       03/20/2022   10:44 AM 03/14/2022    9:15 AM 01/26/2022   10:54 AM  Weight /BMI  Weight 187 lb 186 lb 1.1 oz 187 lb 9.6 oz  Height '5\' 6"'$  (1.676 m) '5\' 6"'$  (1.676 m) '5\' 6"'$  (1.676 m)  BMI 30.18 kg/m2 30.03 kg/m2 30.28 kg/m2

## 2022-03-21 NOTE — Progress Notes (Signed)
Cheryl Abbott     MRN: [id number]      DOB: 22-Mar-1946   HPI Cheryl Abbott is here for follow up and re-evaluation of chronic medical conditions, medication management and review of any available recent lab and radiology data.  Preventive health is updated, specifically  Cancer screening and Immunization.  Needs colonoscopy Questions or concerns regarding consultations or procedures which the PT has had in the interim are  addressed. The PT denies any adverse reactions to current medications since the last visit.  C/o bilateral shoulder pain lef worse , no associated trauma C/o darkening of skin in feet and dry skin of legs   ROS Denies recent fever or chills. Denies sinus pressure, nasal congestion, ear pain or sore throat. Denies chest congestion, productive cough or wheezing. Denies chest pains, palpitations and leg swelling Denies abdominal pain, nausea, vomiting,diarrhea or constipation.   Denies dysuria, frequency, hesitancy or incontinence. Denies joint pain, swelling and limitation in mobility. Denies headaches, seizures, numbness, or tingling. Denies depression, anxiety or insomnia. Denies skin break down or rash.   PE  BP 119/78 (BP Location: Right Arm, Patient Position: Sitting)   Pulse 96   Ht 5' 6"  (1.676 m)   Wt 187 lb (84.8 kg)   SpO2 90%   BMI 30.18 kg/m   Patient alert and oriented and in no cardiopulmonary distress.  HEENT: No facial asymmetry, EOMI,     Neck supple .  Chest: Clear to auscultation bilaterally.  CVS: S1, S2 no murmurs, no S3.Regular rate.  ABD: Soft non tender.   Ext: No edema  MS: decreased  ROM spine, left shoulder,adequate in  hips and knees.  Skin: Intact, hyperpigmentation of lower extremity and dry scaling of skin also reduce DP bilaterally Psych: Good eye contact, normal affect. Memory intact not anxious or depressed appearing.  CNS: CN 2-12 intact, power,  normal throughout.no focal deficits noted.   Assessment &  Plan  Chronic left shoulder pain increased pain and debility , 8 to 10 in past 2 months, refer Ortho  Anxiety and depression Controlled, no change in medication   Hyperlipidemia LDL goal <100 Hyperlipidemia:Low fat diet discussed and encouraged.   Lipid Panel  Lab Results  Component Value Date   CHOL 129 07/11/2021   HDL 41 07/11/2021   LDLCALC 58 07/11/2021   TRIG 152 (H) 07/11/2021   CHOLHDL 3.1 07/11/2021     Updated lab needed at/ before next visit.   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   The need to take medication as prescribed, test blood sugar as directed, and to call between visits if there is a concern that blood sugar is uncontrolled is also discussed.   Cheryl Abbott is reminded of the importance of daily foot exam, annual eye examination, and good blood sugar, blood pressure and cholesterol control. Updated lab needed at/ before next visit.      Latest Ref Rng & Units 03/06/2022   11:04 AM 11/29/2021   11:25 AM 11/19/2021    8:02 PM 10/26/2021    1:18 PM 10/18/2021    2:42 PM  Diabetic Labs  HbA1c 4.8 - 5.6 %     6.1   Creatinine 0.44 - 1.00 mg/dL  1.69  1.87  1.71  2.00  1.89       03/20/2022   10:44 AM 03/14/2022    9:15 AM 01/26/2022   10:54 AM 01/09/2022    1:00 PM 12/25/2021    3:17 PM 12/25/2021    1:49 PM 12/18/2021    3:16 PM  BP/Weight  Systolic BP 161 096 045 409 811 914 782  Diastolic BP 78 73 70 73 76 77 70  Wt. (Lbs) 187 186.07 187.6 183     BMI 30.18 kg/m2 30.03 kg/m2 30.28 kg/m2 29.09 kg/m2         Latest Ref Rng & Units 08/15/2021   12:00 AM 03/08/2021   12:00 AM  Foot/eye exam completion dates  Eye Exam No Retinopathy No Retinopathy     No Retinopathy         This  result is from an external source.        Obesity (BMI 30.0-34.9)  Patient re-educated about  the importance of commitment to a  minimum of 150 minutes of exercise per week as able.  The importance of healthy food choices with portion control discussed, as well as eating regularly and within a 12 hour window most days. The need to choose "clean , green" food 50 to 75% of the time is discussed, as well as to make water the primary drink and set a goal of 64 ounces water daily.       03/20/2022   10:44 AM 03/14/2022    9:15 AM 01/26/2022   10:54 AM  Weight /BMI  Weight 187 lb 186 lb 1.1 oz 187 lb 9.6 oz  Height 5' 6"  (1.676 m) 5' 6"  (1.676 m) 5' 6"  (1.676 m)  BMI 30.18 kg/m2 30.03 kg/m2 30.28 kg/m2      Hypoxia Reports difficulty breathing at night and fatigue, oxygen at visit low, arrange overnight study and obtain cXR

## 2022-03-21 NOTE — Assessment & Plan Note (Signed)
Vascular to assess

## 2022-03-21 NOTE — Assessment & Plan Note (Signed)
Controlled, no change in medication  

## 2022-03-21 NOTE — Assessment & Plan Note (Signed)
Ms. Renken is reminded of the importance of commitment to daily physical activity for 30 minutes or more, as able and the need to limit carbohydrate intake to 30 to 60 grams per meal to help with blood sugar control.   The need to take medication as prescribed, test blood sugar as directed, and to call between visits if there is a concern that blood sugar is uncontrolled is also discussed.   Ms. Caridi is reminded of the importance of daily foot exam, annual eye examination, and good blood sugar, blood pressure and cholesterol control. Updated lab needed at/ before next visit.      Latest Ref Rng & Units 03/06/2022   11:04 AM 11/29/2021   11:25 AM 11/19/2021    8:02 PM 10/26/2021    1:18 PM 10/18/2021    2:42 PM  Diabetic Labs  HbA1c 4.8 - 5.6 %     6.1   Creatinine 0.44 - 1.00 mg/dL 1.69  1.87  1.71  2.00  1.89       03/20/2022   10:44 AM 03/14/2022    9:15 AM 01/26/2022   10:54 AM 01/09/2022    1:00 PM 12/25/2021    3:17 PM 12/25/2021    1:49 PM 12/18/2021    3:16 PM  BP/Weight  Systolic BP 333 832 919 166 060 045 997  Diastolic BP 78 73 70 73 76 77 70  Wt. (Lbs) 187 186.07 187.6 183     BMI 30.18 kg/m2 30.03 kg/m2 30.28 kg/m2 29.09 kg/m2         Latest Ref Rng & Units 08/15/2021   12:00 AM 03/08/2021   12:00 AM  Foot/eye exam completion dates  Eye Exam No Retinopathy No Retinopathy     No Retinopathy         This result is from an external source.

## 2022-03-21 NOTE — Assessment & Plan Note (Signed)
Hyperlipidemia:Low fat diet discussed and encouraged.   Lipid Panel  Lab Results  Component Value Date   CHOL 129 07/11/2021   HDL 41 07/11/2021   LDLCALC 58 07/11/2021   TRIG 152 (H) 07/11/2021   CHOLHDL 3.1 07/11/2021     Updated lab needed at/ before next visit.

## 2022-03-22 LAB — MICROALBUMIN / CREATININE URINE RATIO
Creatinine, Urine: 79.2 mg/dL
Microalb/Creat Ratio: 48 mg/g creat — ABNORMAL HIGH (ref 0–29)
Microalbumin, Urine: 37.7 ug/mL

## 2022-03-22 LAB — CMP14+EGFR
ALT: 13 IU/L (ref 0–32)
AST: 14 IU/L (ref 0–40)
Albumin/Globulin Ratio: 1.2 (ref 1.2–2.2)
Albumin: 4.6 g/dL (ref 3.8–4.8)
Alkaline Phosphatase: 99 IU/L (ref 44–121)
BUN/Creatinine Ratio: 13 (ref 12–28)
BUN: 18 mg/dL (ref 8–27)
Bilirubin Total: 0.3 mg/dL (ref 0.0–1.2)
CO2: 22 mmol/L (ref 20–29)
Calcium: 10.3 mg/dL (ref 8.7–10.3)
Chloride: 103 mmol/L (ref 96–106)
Creatinine, Ser: 1.34 mg/dL — ABNORMAL HIGH (ref 0.57–1.00)
Globulin, Total: 3.7 g/dL (ref 1.5–4.5)
Glucose: 115 mg/dL — ABNORMAL HIGH (ref 70–99)
Potassium: 4.7 mmol/L (ref 3.5–5.2)
Sodium: 141 mmol/L (ref 134–144)
Total Protein: 8.3 g/dL (ref 6.0–8.5)
eGFR: 41 mL/min/{1.73_m2} — ABNORMAL LOW (ref >59)

## 2022-03-22 LAB — LIPID PANEL
Chol/HDL Ratio: 3.7 ratio (ref 0.0–4.4)
Cholesterol, Total: 150 mg/dL (ref 100–199)
HDL: 41 mg/dL (ref >39)
LDL Chol Calc (NIH): 89 mg/dL (ref 0–99)
Triglycerides: 111 mg/dL (ref 0–149)
VLDL Cholesterol Cal: 20 mg/dL (ref 5–40)

## 2022-03-22 LAB — HEMOGLOBIN A1C
Est. average glucose Bld gHb Est-mCnc: 134 mg/dL
Hgb A1c MFr Bld: 6.3 % — ABNORMAL HIGH (ref 4.8–5.6)

## 2022-03-22 LAB — VITAMIN D 25 HYDROXY (VIT D DEFICIENCY, FRACTURES): Vit D, 25-Hydroxy: 43.4 ng/mL (ref 30.0–100.0)

## 2022-03-22 LAB — TSH: TSH: 2.16 u[IU]/mL (ref 0.450–4.500)

## 2022-03-22 NOTE — Telephone Encounter (Signed)
LMTRC

## 2022-03-23 ENCOUNTER — Encounter: Payer: Self-pay | Admitting: Family Medicine

## 2022-03-26 ENCOUNTER — Encounter: Payer: Self-pay | Admitting: Orthopedic Surgery

## 2022-03-26 ENCOUNTER — Other Ambulatory Visit: Payer: Self-pay | Admitting: *Deleted

## 2022-03-26 ENCOUNTER — Ambulatory Visit (INDEPENDENT_AMBULATORY_CARE_PROVIDER_SITE_OTHER): Payer: Medicare Other | Admitting: Orthopedic Surgery

## 2022-03-26 DIAGNOSIS — I739 Peripheral vascular disease, unspecified: Secondary | ICD-10-CM

## 2022-03-26 DIAGNOSIS — M25512 Pain in left shoulder: Secondary | ICD-10-CM | POA: Diagnosis not present

## 2022-03-26 DIAGNOSIS — G8929 Other chronic pain: Secondary | ICD-10-CM | POA: Diagnosis not present

## 2022-03-26 MED ORDER — METHYLPREDNISOLONE ACETATE 40 MG/ML IJ SUSP
40.0000 mg | Freq: Once | INTRAMUSCULAR | Status: AC
  Administered 2022-03-26: 40 mg via INTRA_ARTICULAR

## 2022-03-26 NOTE — Progress Notes (Unsigned)
Chief Complaint  Patient presents with   Shoulder Pain    Left    Encounter Diagnosis  Name Primary?   Chronic left shoulder pain Yes   76 year old female chronic left shoulder pain.  She had MRI which showed no evidence of operable cuff tear.  She was sent to neurosurgery to address her cervical spine disease  He placed the patient on gabapentin she is using a topical cream and Tylenol and a heating pad  Apparently during her most recent physical exam primary care physician recommended an injection in her left shoulder  This is fine as she has no operable disease  Injection will be done patient will follow-up as needed and continue current regimen   Procedure note the subacromial injection shoulder left   Verbal consent was obtained to inject the  Left   Shoulder  Timeout was completed to confirm the injection site is a subacromial space of the  left  shoulder  Medication used Depo-Medrol 40 mg and lidocaine 1% 3 cc  Anesthesia was provided by ethyl chloride  The injection was performed in the left  posterior subacromial space. After pinning the skin with alcohol and anesthetized the skin with ethyl chloride the subacromial space was injected using a 20-gauge needle. There were no complications  Sterile dressing was applied.   F/u prn

## 2022-03-26 NOTE — Patient Outreach (Signed)
  Care Coordination   03/26/2022  Name: Cheryl Abbott MRN: [id number] DOB: 09-27-1945   Care Coordination Outreach Attempts:  A second unsuccessful outreach was attempted today to offer the patient with information about available care coordination services as a benefit of their health plan.   HIPAA compliant message left on voicemail, providing contact information for CSW, encouraging patient to return CSW's call at her earliest convenience.     Follow Up Plan:  Additional outreach attempts will be made to offer the patient care coordination information and services.    Encounter Outcome:  No Answer.    Care Coordination Interventions Activated:  No.     Care Coordination Interventions:  No, not indicated.     Nat Christen, BSW, MSW, LCSW  Licensed Education officer, [employer] [facility]  Mailing [place] [address], [place], [place] [address] Physical Address-[address] [place], [place] [address] Toll Free Main # [phone] Fax # [phone] Cell # [phone] [email or web address]

## 2022-03-26 NOTE — Patient Instructions (Signed)

## 2022-03-27 NOTE — Telephone Encounter (Signed)
Overnight puls ox documents need for bedtime oxygen at 2l/ min humidified, pls order, report is in your area thanks

## 2022-03-28 ENCOUNTER — Other Ambulatory Visit: Payer: Self-pay

## 2022-03-28 ENCOUNTER — Encounter: Payer: Self-pay | Admitting: *Deleted

## 2022-03-28 ENCOUNTER — Other Ambulatory Visit: Payer: Self-pay | Admitting: Orthopedic Surgery

## 2022-03-28 ENCOUNTER — Encounter: Payer: Self-pay | Admitting: Vascular Surgery

## 2022-03-28 ENCOUNTER — Ambulatory Visit (INDEPENDENT_AMBULATORY_CARE_PROVIDER_SITE_OTHER): Payer: Medicare Other | Admitting: Vascular Surgery

## 2022-03-28 ENCOUNTER — Ambulatory Visit (INDEPENDENT_AMBULATORY_CARE_PROVIDER_SITE_OTHER): Payer: Medicare Other

## 2022-03-28 VITALS — BP 125/77 | HR 83 | Temp 97.7°F | Ht 66.0 in | Wt 189.0 lb

## 2022-03-28 DIAGNOSIS — R0902 Hypoxemia: Secondary | ICD-10-CM

## 2022-03-28 DIAGNOSIS — M7989 Other specified soft tissue disorders: Secondary | ICD-10-CM

## 2022-03-28 DIAGNOSIS — I739 Peripheral vascular disease, unspecified: Secondary | ICD-10-CM

## 2022-03-28 DIAGNOSIS — M25512 Pain in left shoulder: Secondary | ICD-10-CM

## 2022-03-28 NOTE — Progress Notes (Signed)
Vascular and Vein Specialist of [place]  Patient name: Cheryl Abbott MRN: [id number] DOB: September 27, 1945 Sex: female  REASON FOR CONSULT: Evaluation lower extremity symptoms rule out arterial disease  HPI: Cheryl Abbott is a 76 y.o. female, who is here today for evaluation of lower extremity symptoms.  She does not have any true claudication symptoms.  She does have some lower extremity swelling more so on the right than on the left.  She does have varicosities and reports this been present for approximately 20 years.  She has no history of DVT.  She reports nocturnal cramping.  No claudication.  Past Medical History:  Diagnosis Date   Anxiety disorder    Arthritis    Chronic back pain    Chronic pain of both shoulders    per pt told due to cervical spine pinched nerve   CKD (chronic kidney disease), stage III [facility])    nephrologist--- dr Theador Hawthorne;   due to tubular necrosis   Claustrophobia    Depression    Edema of both lower extremities    GERD (gastroesophageal reflux disease)    Headache    Hemorrhoids    w/ intermittant bleeding   History of adenomatous polyp of colon    History of kidney stones    Hx-TIA (transient ischemic attack) 02/26/2016   per pt no residual   Hydronephrosis, left    followed by urologist--- dr Diona Fanti;   persistant   Hyperlipidemia    Hypertension    Iron deficiency    followed by [facility] w/ hx iron infusions   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   oncologist--- [facility];  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, active survillence   Lactose intolerance in adult 02/01/2016   Left ureteral calculus    Lumbar disc disease with radiculopathy    Nephrolithiasis    per CT 08-11-2021  left > right   Neuropathy, peripheral    Nocturia more than twice per night    Normocytic anemia    OSA (obstructive sleep apnea)    per study 2007;   per pt no Bipap use since 2021   Renal atrophy,  bilateral    Renal cyst, right    Sciatica    Sigmoid diverticulosis    Type 2 diabetes mellitus ([place])     Family History  Problem Relation Age of Onset   Hypertension Mother    Diabetes Mother    Heart failure Mother    Dementia Mother    Emphysema Father    Hypertension Father    Diabetes Brother    GER disease Brother    Hypertension Sister    Hypertension Sister    Cancer Other        family history    Diabetes Other        family history    Heart defect Other        famiily history    Arthritis Other        family history    Anesthesia problems Neg Hx    Hypotension Neg Hx    Malignant hyperthermia Neg Hx    Pseudochol deficiency Neg Hx    Colon cancer Neg Hx     SOCIAL HISTORY: Social History   Socioeconomic History   Marital status: Divorced    Spouse name: Not on file   Number of children: 2   Years of education: Not on file   Highest education level: Not  on file  Occupational History   Occupation: retired   Tobacco Use   Smoking status: Former    Packs/day: 1.00    Years: 20.00    Total pack years: 20.00    Types: Cigarettes    Start date: 09/21/1974    Quit date: 09/20/1988    Years since quitting: 33.5   Smokeless tobacco: Never  Vaping Use   Vaping Use: Never used  Substance and Sexual Activity   Alcohol use: No    Alcohol/week: 0.0 standard drinks of alcohol   Drug use: No   Sexual activity: Never    Birth control/protection: Surgical  Other Topics Concern   Not on file  Social History Narrative   Patient is right handed   Patient drinks some caffeine daily.   Social Determinants of Health   Financial Resource Strain: Low Risk  (04/17/2021)   Overall Financial Resource Strain (CARDIA)    Difficulty of Paying Living Expenses: Not hard at all  Food Insecurity: No Food Insecurity (04/17/2021)   Hunger Vital Sign    Worried About Running Out of Food in the Last Year: Never true    Ran Out of Food in the Last Year: Never true   Transportation Needs: No Transportation Needs (04/17/2021)   PRAPARE - Hydrologist (Medical): No    Lack of Transportation (Non-Medical): No  Physical Activity: Inactive (04/17/2021)   Exercise Vital Sign    Days of Exercise per Week: 0 days    Minutes of Exercise per Session: 0 min  Stress: No Stress Concern Present (04/17/2021)   [place]    Feeling of Stress : Not at all  Social Connections: Moderately Isolated (04/17/2021)   Social Connection and Isolation Panel [NHANES]    Frequency of Communication with Friends and Family: More than three times a week    Frequency of Social Gatherings with Friends and Family: Never    Attends Religious Services: More than 4 times per year    Active Member of [employer] or Organizations: No    Attends Archivist Meetings: Never    Marital Status: Divorced  Human resources officer Violence: Not At Risk (04/17/2021)   Humiliation, Afraid, Rape, and Kick questionnaire    Fear of Current or Ex-Partner: No    Emotionally Abused: No    Physically Abused: No    Sexually Abused: No    Allergies  Allergen Reactions   Ace Inhibitors Cough    Patient doesn't recall   Keflex [Cephalexin] Diarrhea and Nausea And Vomiting   Nitrofurantoin Diarrhea and Nausea And Vomiting   Penicillins Hives and Other (See Comments)    Reaction:  Blisters on hands/feet  Has patient had a PCN reaction causing immediate rash, facial/tongue/throat swelling, SOB or lightheadedness with hypotension: No Has patient had a PCN reaction causing severe rash involving mucus membranes or skin necrosis: No Has patient had a PCN reaction that required hospitalization No Has patient had a PCN reaction occurring within the last 10 years: No If all of the above answers are "NO", then may proceed with Cephalosporin use.    Current Outpatient Medications  Medication Sig Dispense Refill    acetaminophen (TYLENOL) 500 MG tablet Take 1,000 mg by mouth every 6 (six) hours as needed for mild pain.     amLODipine (NORVASC) 10 MG tablet TAKE 1 TABLET BY MOUTH EVERY DAY 90 tablet 1   bisacodyl (DULCOLAX) 10 MG suppository Place 1  suppository (10 mg total) rectally as needed for moderate constipation. 12 suppository 0   bisacodyl (DULCOLAX) 5 MG EC tablet Take 5 mg by mouth daily as needed for moderate constipation.     diclofenac Sodium (VOLTAREN) 1 % GEL APPLY 4 G TOPICALLY 4 (FOUR) TIMES DAILY AS NEEDED (PAIN). 400 g 5   diphenhydramine-acetaminophen (TYLENOL PM) 25-500 MG TABS tablet Take 2 tablets by mouth at bedtime.     docusate sodium (COLACE) 100 MG capsule Take 1 capsule (100 mg total) by mouth 2 (two) times daily. (Patient taking differently: Take 100 mg by mouth 2 (two) times daily as needed.) 10 capsule 0   Fluticasone-Umeclidin-Vilant (TRELEGY ELLIPTA) 100-62.5-25 MCG/ACT AEPB Inhale 1 puff into the lungs daily. 1 each 11   gabapentin (NEURONTIN) 100 MG capsule Take 100 mg by mouth daily.     gabapentin (NEURONTIN) 300 MG capsule Take 300 mg by mouth at bedtime.     nitroGLYCERIN (NITROSTAT) 0.4 MG SL tablet Place 1 tablet (0.4 mg total) under the tongue every 5 (five) minutes as needed for chest pain. Up to 3 tablets, then call 911. 25 tablet 3   OneTouch Delica Lancets 93A MISC Once daily testing dx e11.9 100 each 5   ONETOUCH ULTRA test strip USE AS INSTRUCTED ONCE DAILY DX E11.9 100 strip 5   oxybutynin (DITROPAN) 5 MG tablet 1 tab po q 6 hrs prn stent discomfort 30 tablet 1   pantoprazole (PROTONIX) 40 MG tablet TAKE 1 TABLET BY MOUTH TWICE A DAY 180 tablet 1   PARoxetine (PAXIL) 40 MG tablet TAKE 1 TABLET (40 MG TOTAL) BY MOUTH IN THE MORNING 90 tablet 1   rosuvastatin (CRESTOR) 20 MG tablet TAKE 1 TABLET BY MOUTH EVERY DAY (Patient taking differently: Take 20 mg by mouth.) 90 tablet 3   SYMBICORT 80-4.5 MCG/ACT inhaler Inhale 2 puffs into the lungs 2 (two) times daily.      TRADJENTA 5 MG TABS tablet TAKE 1 TABLET (5 MG TOTAL) BY MOUTH DAILY. 90 tablet 1   traMADol (ULTRAM) 50 MG tablet TAKE 1 TABLET BY MOUTH EVERY 6 HOURS AS NEEDED 60 tablet 0   triamcinolone cream (KENALOG) 0.1 % Apply 1 application topically 2 (two) times daily. (Patient taking differently: Apply 1 application  topically 2 (two) times daily as needed.) 30 g 0   TRULICITY 3.55 DD/2.2GU SOPN INJECT 0.75 MG INTO THE SKIN WEEKLY 6 mL 1   UNABLE TO FIND Diabetic shoes x 1 pair, inserts x 3 pair  DX E11.9 1 each 0   No current facility-administered medications for this visit.    REVIEW OF SYSTEMS:  '[X]'$  denotes positive finding, '[ ]'$  denotes negative finding Cardiac  Comments:  Chest pain or chest pressure:    Shortness of breath upon exertion: x   Short of breath when lying flat:    Irregular heart rhythm:        Vascular    Pain in calf, thigh, or hip brought on by ambulation: x   Pain in feet at night that wakes you up from your sleep:  x   Blood clot in your veins:    Leg swelling:  x       Pulmonary    Oxygen at home:    Productive cough:     Wheezing:         Neurologic    Sudden weakness in arms or legs:     Sudden numbness in arms or legs:  x  Sudden onset of difficulty speaking or slurred speech:    Temporary loss of vision in one eye:     Problems with dizziness:  x       Gastrointestinal    Blood in stool:     Vomited blood:         Genitourinary    Burning when urinating:     Blood in urine:        Psychiatric    Major depression:         Hematologic    Bleeding problems:    Problems with blood clotting too easily:        Skin    Rashes or ulcers:        Constitutional    Fever or chills:      PHYSICAL EXAM: Vitals:   03/28/22 0918  BP: 125/77  Pulse: 83  Temp: 97.7 F (36.5 C)  SpO2: 95%  Weight: 189 lb (85.7 kg)  Height: '5\' 6"'$  (1.676 m)    GENERAL: The patient is a well-nourished female, in no acute distress. The vital signs are  documented above. CARDIOVASCULAR: 2+ radial and 2+ dorsalis pedis pulses bilaterally.  She does have some moderate edema particularly in the right distal ankle with some hemosiderin deposit.  Scattered varicosities and telangiectasia in both lower extremities from her knees distally. PULMONARY: There is good air exchange  MUSCULOSKELETAL: There are no major deformities or cyanosis. NEUROLOGIC: No focal weakness or paresthesias are detected. SKIN: There are no ulcers or rashes noted. PSYCHIATRIC: The patient has a normal affect.  DATA:  Normal arterial study bilaterally  MEDICAL ISSUES: No evidence of lower extremity arterial disease.  Splane that her symptoms are related to venous congestion.  I did explain the importance of elevation.  She has attempted graduated compression stockings in the past but has been unable to tolerate these.  It is difficult for her to apply them.  I discussed the expected slow progression and that this can be improved with elevation And compression.  We will see [place] again on an as-needed basis  Rosetta Posner, MD [facility] Vascular and Vein Specialists of [facility] [phone] Pager [phone]  Note: Portions of this report may have been transcribed using voice recognition software.  Every effort has been made to ensure accuracy; however, inadvertent computerized transcription errors may still be present.

## 2022-03-28 NOTE — Telephone Encounter (Signed)
Oxygen order faxed to [facility].

## 2022-03-30 ENCOUNTER — Ambulatory Visit (HOSPITAL_COMMUNITY)
Admission: RE | Admit: 2022-03-30 | Discharge: 2022-03-30 | Disposition: A | Discharge status: Home / Self Care | Payer: Medicare Other | Source: Ambulatory Visit | Attending: Family Medicine | Admitting: Family Medicine

## 2022-03-30 DIAGNOSIS — Z8739 Personal history of other diseases of the musculoskeletal system and connective tissue: Secondary | ICD-10-CM | POA: Insufficient documentation

## 2022-03-30 DIAGNOSIS — Z780 Asymptomatic menopausal state: Secondary | ICD-10-CM | POA: Insufficient documentation

## 2022-03-30 DIAGNOSIS — M8589 Other specified disorders of bone density and structure, multiple sites: Secondary | ICD-10-CM | POA: Diagnosis not present

## 2022-04-02 ENCOUNTER — Encounter: Payer: Self-pay | Admitting: *Deleted

## 2022-04-02 ENCOUNTER — Ambulatory Visit: Payer: Self-pay | Admitting: *Deleted

## 2022-04-02 NOTE — Patient Outreach (Signed)
  Care Coordination   Initial Visit Note   04/02/2022  Name: Cheryl Abbott MRN: [id number] DOB: November 27, 1945  Cheryl Abbott is a 76 y.o. year old female who sees Cheryl Abbott, Norwood Levo, MD for primary care. I spoke with Cheryl Abbott by phone today.  What matters to the patients health and wellness today?   No Interventions Identified. CSW collaboration with Orthopedic Surgeon, Dr. Arther Abbott, to request prescription for Tramadol. CSW collaboration with Primary Care Provider, Dr. Tula Abbott, to report bilateral ankle pain, swelling and edema.   CSW collaboration with Vascular Surgeon, Dr. Curt Abbott, to inquire about treatment of ankle pain, swelling and edema. ~ Use ice and compression for ankle pain, swelling and edema.    CSW collaboration with Primary Care Provider, Dr. Tula Abbott, to request referral to gastroenterologist.   SDOH assessments and interventions completed:  Yes.  SDOH Interventions Today    Flowsheet Row Most Recent Value  SDOH Interventions   Food Insecurity Interventions Intervention Not Indicated  Housing Interventions Intervention Not Indicated  Transportation Interventions Intervention Not Indicated  Utilities Interventions Intervention Not Indicated  Alcohol Usage Interventions Intervention Not Indicated (Score <7)  Financial Strain Interventions Intervention Not Indicated  Physical Activity Interventions Patient Refused  Stress Interventions Intervention Not Indicated  Social Connections Interventions Patient Refused     Care Coordination Interventions Activated:  Yes.   Care Coordination Interventions:  Yes, provided.   Follow up plan: No further intervention required.   Encounter Outcome:  Pt. Visit Completed.   Cheryl Abbott, BSW, MSW, LCSW  Licensed Education officer, [facility]  Mailing [place] [address] [place], [place] [address] Physical Address[address], [place], [place] [address] Toll Free Main # [phone] Fax # [phone] Cell # [phone] [email or web address]

## 2022-04-02 NOTE — Patient Instructions (Signed)
Visit Information  Thank you for taking time to visit with me today. Please don't hesitate to contact me if I can be of assistance to you.   Please call the care guide team at [phone] if you need to cancel or reschedule your appointment.   If you are experiencing a Mental Health or Behavioral Health Crisis or need someone to talk to, please call the Suicide and Crisis Lifeline: 988 call the [place] National Suicide Prevention Lifeline: 1-800-273-8255 or TTY: 1-800-799-4 TTY (1-800-799-4889) to talk to a trained counselor call 1-800-273-TALK (toll free, 24 hour hotline) go to [facility] [address], [place] ([phone]) call the [place] Crisis Line: 800-939-9988 call 911  Patient verbalizes understanding of instructions and care plan provided today and agrees to view in MyChart. Active MyChart status and patient understanding of how to access instructions and care plan via MyChart confirmed with patient.     No further follow up required.  Brownie Gockel, BSW, MSW, LCSW  Licensed Clinical Social Worker  [facility]  Mailing Address-[address], [place], [place] [address] Physical Address-[address], [place], [place] [address] Toll Free Main # [phone] Fax # [phone] Cell # [phone] [email or web address]            

## 2022-04-19 DIAGNOSIS — R0902 Hypoxemia: Secondary | ICD-10-CM | POA: Diagnosis not present

## 2022-05-05 ENCOUNTER — Other Ambulatory Visit: Payer: Self-pay | Admitting: Orthopedic Surgery

## 2022-05-05 DIAGNOSIS — M25512 Pain in left shoulder: Secondary | ICD-10-CM

## 2022-05-08 ENCOUNTER — Ambulatory Visit (INDEPENDENT_AMBULATORY_CARE_PROVIDER_SITE_OTHER): Payer: Medicare Other | Admitting: Gastroenterology

## 2022-05-08 ENCOUNTER — Encounter: Payer: Self-pay | Admitting: Gastroenterology

## 2022-05-08 VITALS — BP 116/69 | HR 105 | Temp 98.5°F | Ht 66.5 in | Wt 193.0 lb

## 2022-05-08 DIAGNOSIS — Z860101 Personal history of adenomatous and serrated colon polyps: Secondary | ICD-10-CM | POA: Insufficient documentation

## 2022-05-08 DIAGNOSIS — Z86010 Personal history of colonic polyps: Secondary | ICD-10-CM | POA: Diagnosis not present

## 2022-05-08 NOTE — Patient Instructions (Signed)
I will discuss with Dr. Abbey Chatters regarding possible future colonoscopy for history of polyps to get his opinion. We will be in touch with further recommendations.

## 2022-05-08 NOTE — Progress Notes (Signed)
GI Office Note    Referring Provider: [name], MD Primary Care Physician:  [name], MD  Primary Gastroenterologist:  Chief Complaint   Chief Complaint  Patient presents with   Colonoscopy     History of Present Illness   Cheryl Abbott is a 76 y.o. female presenting today     Lax pills, 2 per night. Can't go without it. Miralax at home.   Hemorrhoid flare. Can bleed.     Colonoscopy 09/2019: - Six 3 to 5 mm polyps in the transverse colon and in the ascending colon, removed with a cold snare. Resected and retrieved. - Three 3 to 5 mm, non-bleeding polyps in the sigmoid colon and in the descending colon, removed with a hot snare and removed with a cold snare. Resected and retrieved. - Diverticulosis in the recto-sigmoid colon, in the sigmoid colon, in the descending colon, at the splenic flexure and at the hepatic flexure. - External and internal hemorrhoids. - Tortuous colon.    Medications   Current Outpatient Medications  Medication Sig Dispense Refill   acetaminophen (TYLENOL) 500 MG tablet Take 1,000 mg by mouth every 6 (six) hours as needed for mild pain.     amLODipine (NORVASC) 10 MG tablet TAKE 1 TABLET BY MOUTH EVERY DAY 90 tablet 1   aspirin EC 81 MG tablet Take 81 mg by mouth daily. Swallow whole.     diclofenac Sodium (VOLTAREN) 1 % GEL APPLY 4 G TOPICALLY 4 (FOUR) TIMES DAILY AS NEEDED (PAIN). 400 g 5   diphenhydramine-acetaminophen (TYLENOL PM) 25-500 MG TABS tablet Take 2 tablets by mouth at bedtime.     Fluticasone-Umeclidin-Vilant (TRELEGY ELLIPTA) 100-62.5-25 MCG/ACT AEPB Inhale 1 puff into the lungs daily. 1 each 11   gabapentin (NEURONTIN) 300 MG capsule Take 300 mg by mouth at bedtime.     nitroGLYCERIN (NITROSTAT) 0.4 MG SL tablet Place 1 tablet (0.4 mg total) under the tongue every 5 (five) minutes as needed for chest pain. Up to 3 tablets, then call 911. 25 tablet 3   pantoprazole (PROTONIX) 40 MG tablet TAKE 1  TABLET BY MOUTH TWICE A DAY 180 tablet 1   PARoxetine (PAXIL) 40 MG tablet TAKE 1 TABLET (40 MG TOTAL) BY MOUTH IN THE MORNING 90 tablet 1   rosuvastatin (CRESTOR) 20 MG tablet TAKE 1 TABLET BY MOUTH EVERY DAY (Patient taking differently: Take 20 mg by mouth.) 90 tablet 3   SYMBICORT 80-4.5 MCG/ACT inhaler Inhale 2 puffs into the lungs 2 (two) times daily.     TRADJENTA 5 MG TABS tablet TAKE 1 TABLET (5 MG TOTAL) BY MOUTH DAILY. 90 tablet 1   triamcinolone cream (KENALOG) 0.1 % Apply 1 application topically 2 (two) times daily. (Patient taking differently: Apply 1 application  topically 2 (two) times daily as needed.) 30 g 0   TRULICITY 3.79 KW/4.0XB SOPN INJECT 0.75 MG INTO THE SKIN WEEKLY 6 mL 1   VITAMIN D, CHOLECALCIFEROL, PO Take 1 tablet by mouth daily.     OneTouch Delica Lancets 35H MISC Once daily testing dx e11.9 100 each 5   ONETOUCH ULTRA test strip USE AS INSTRUCTED ONCE DAILY DX E11.9 100 strip 5   UNABLE TO FIND Diabetic shoes x 1 pair, inserts x 3 pair  DX E11.9 1 each 0   No current facility-administered medications for this visit.    Allergies   Allergies as of 05/08/2022 - Review Complete 05/08/2022  Allergen Reaction Noted   Ace  inhibitors Cough 12/11/2010   Keflex [cephalexin] Diarrhea and Nausea And Vomiting 07/24/2011   Nitrofurantoin Diarrhea and Nausea And Vomiting 09/08/2009   Penicillins Hives and Other (See Comments) 09/17/2007    Past Medical History   Past Medical History:  Diagnosis Date   Anxiety disorder    Arthritis    Chronic back pain    Chronic pain of both shoulders    per pt told due to cervical spine pinched nerve   CKD (chronic kidney disease), stage III [facility])    nephrologist--- dr Theador Hawthorne;   due to tubular necrosis   Claustrophobia    Depression    Edema of both lower extremities    GERD (gastroesophageal reflux disease)    Headache    Hemorrhoids    w/ intermittant bleeding   History of adenomatous polyp of colon    History of  kidney stones    Hx-TIA (transient ischemic attack) 02/26/2016   per pt no residual   Hydronephrosis, left    followed by urologist--- dr Diona Fanti;   persistant   Hyperlipidemia    Hypertension    Iron deficiency    followed by [facility] w/ hx iron infusions   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   oncologist--- [facility];  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, active survillence   Lactose intolerance in adult 02/01/2016   Left ureteral calculus    Lumbar disc disease with radiculopathy    Nephrolithiasis    per CT 08-11-2021  left > right   Neuropathy, peripheral    Nocturia more than twice per night    Normocytic anemia    OSA (obstructive sleep apnea)    per study 2007;   per pt no Bipap use since 2021   Renal atrophy, bilateral    Renal cyst, right    Sciatica    Sigmoid diverticulosis    Type 2 diabetes mellitus ([place])     Past Surgical History   Past Surgical History:  Procedure Laterality Date   BIOPSY N/A 03/17/2013   Procedure: GASTRIC BIOPSIES;  Surgeon: Danie Binder, MD;  Location: [facility];  Service: Endoscopy;  Laterality: N/A;   BIOPSY  06/10/2015   Procedure: BIOPSY;  Surgeon: Danie Binder, MD;  [facility];  Service: Endoscopy;;  gastric biopsy   CARDIAC CATHETERIZATION  05/11/2003   @ [place] [facility] by dr t. Claiborne Billings;  Abnormal cardiolite/   Normal coronary arteries and normal LVF,  ef  63%   CARDIOVASCULAR STRESS TEST  01/01/2014   normal lexiscan cardiolite/  no ischemia/ infarct/  normal LV function and wall motion ,  ef 81%   COLONOSCOPY WITH PROPOFOL N/A 02/26/2017   Procedure: COLONOSCOPY WITH PROPOFOL;  Surgeon: Danie Binder, MD;  [facility];  Service: Endoscopy;  Laterality: N/A;  11:00am   COLONOSCOPY WITH PROPOFOL N/A 09/22/2019   Procedure: COLONOSCOPY WITH PROPOFOL;  Surgeon: Danie Binder, MD;  [facility];  Service: Endoscopy;  Laterality: N/A;  1:15   CYSTO/    RIGHT URETEROSOCOPY LASER LITHOTRIPSY STONE EXTRACTION  10/13/2004   CYSTO/  RIGHT RETROGRADE PYELOGRAM/  PLACMENT RIGHT URETERAL STENT  01/10/2010   CYSTOSCOPY W/ URETERAL STENT PLACEMENT Right 08/19/2015   Procedure: CYSTOSCOPY WITH RETROGRADE PYELOGRAM/URETERAL STENT PLACEMENT;  Surgeon: Franchot Gallo, MD;  [facility]: [facility];  Service: Urology;  Laterality: Right;   CYSTOSCOPY W/ URETERAL STENT PLACEMENT Left 02/23/2020   Procedure: CYSTOSCOPY LEFT  RETROGRADE  PYELOGRAM LEFT URETEROSCOPY URETERAL STENT PLACEMENT;  Surgeon: Franchot Gallo, MD;  Location: [facility];  Service: Urology;  Laterality: Left;   CYSTOSCOPY WITH RETROGRADE PYELOGRAM, URETEROSCOPY AND STENT PLACEMENT Left 10/21/2014   Procedure: CYSTOSCOPY WITH RETROGRADE PYELOGRAM, URETEROSCOPY AND STENT PLACEMENT;  Surgeon: Franchot Gallo, MD;  Location: [facility];  Service: Urology;  Laterality: Left;   CYSTOSCOPY WITH RETROGRADE PYELOGRAM, URETEROSCOPY AND STENT PLACEMENT Right 11/22/2015   Procedure: CYSTOSCOPY, RIGHT URETERAL STENT REMOVAL; RIGHT RETROGRADE PYELOGRAM, RIGHT URETEROSCOPY WITH BALLOON DILATION; RIGHT URETERAL STENT PLACEMENT;  Surgeon: Franchot Gallo, MD;  [facility];  Service: Urology;  Laterality: Right;   CYSTOSCOPY WITH RETROGRADE PYELOGRAM, URETEROSCOPY AND STENT PLACEMENT Left 10/18/2020   Procedure: CYSTOSCOPY WITH LEFT RETROGRADE PYELOGRAM, LEFT URETEROSCOPY  WITH LASER AND LEFT URETERAL STENT PLACEMENT;  Surgeon: Franchot Gallo, MD;  [facility];  Service: Urology;  Laterality: Left;   CYSTOSCOPY WITH RETROGRADE PYELOGRAM, URETEROSCOPY AND STENT PLACEMENT Left 10/26/2021   Procedure: CYSTOSCOPY WITH RETROGRADE PYELOGRAM, URETEROSCOPY, STONE EXTRACTION  AND STENT PLACEMENT;  Surgeon: Franchot Gallo, MD;  [facility];  Service: Urology;  Laterality: Left;   CYSTOSCOPY/RETROGRADE/URETEROSCOPY/STONE EXTRACTION WITH BASKET Bilateral 08/02/2015    Procedure: CYSTOSCOPY; BILATERAL RETROGRADE PYELOGRAMS; BILATERAL URETEROSCOPY, STONE EXTRACTION WITH BASKET;  Surgeon: Franchot Gallo, MD;  [facility];  Service: Urology;  Laterality: Bilateral;   CYSTOSCOPY/RETROGRADE/URETEROSCOPY/STONE EXTRACTION WITH BASKET Right 06/28/2015   Procedure: CYSTOSCOPY/RETROGRADE/URETEROSCOPY/STONE EXTRACTION WITH BASKET, RIGHT URETERAL DOUBLE J STENT PLACEMENT;  Surgeon: Franchot Gallo, MD;  [facility];  Service: Urology;  Laterality: Right;   ESOPHAGOGASTRODUODENOSCOPY (EGD) WITH PROPOFOL N/A 03/17/2013   Procedure: ESOPHAGOGASTRODUODENOSCOPY (EGD) WITH PROPOFOL;  Surgeon: Danie Binder, MD;  [facility]: [facility];  Service: Endoscopy;  Laterality: N/A;   ESOPHAGOGASTRODUODENOSCOPY (EGD) WITH PROPOFOL N/A 06/10/2015   Distal gastritis, distal esophageal stricture s/p dilation   ESOPHAGOGASTRODUODENOSCOPY (EGD) WITH PROPOFOL N/A 02/26/2017   Procedure: ESOPHAGOGASTRODUODENOSCOPY (EGD) WITH PROPOFOL;  Surgeon: Danie Binder, MD;  [facility];  Service: Endoscopy;  Laterality: N/A;   FLEXIBLE SIGMOIDOSCOPY  09/11/2011   MGQ:[id number] Internal hemorrhoids   HOLMIUM LASER APPLICATION Left 32/67/1245   Procedure: HOLMIUM LASER APPLICATION;  Surgeon: Franchot Gallo, MD;  Location: [facility];  Service: Urology;  Laterality: Left;   HOLMIUM LASER APPLICATION Right 80/99/8338   Procedure: HOLMIUM LASER APPLICATION;  Surgeon: Franchot Gallo, MD;  [facility];  Service: Urology;  Laterality: Right;   HOLMIUM LASER APPLICATION  25/10/3974   Procedure: HOLMIUM LASER APPLICATION;  Surgeon: Franchot Gallo, MD;  [facility];  Service: Urology;;   HOLMIUM LASER APPLICATION Left 73/41/9379   Procedure: HOLMIUM LASER APPLICATION;  Surgeon: Franchot Gallo, MD;  [facility];  Service: Urology;  Laterality: Left;   MASS EXCISION Left 03/04/2015   Procedure: EXCISION OF SOFT TISSUE NEOPLASM LEFT ARM;  Surgeon:  Aviva Signs, MD;  Location[facility];  Service: General;  Laterality: Left;   PERCUTANEOUS NEPHROSTOLITHOTOMY Bilateral 05/2010   '@[facility]'$    POLYPECTOMY N/A 03/17/2013   Procedure: GASTRIC POLYPECTOMY;  Surgeon: Danie Binder, MD;  [facility];  Service: Endoscopy;  Laterality: N/A;   POLYPECTOMY  02/26/2017   Procedure: POLYPECTOMY;  Surgeon: Danie Binder, MD;  [facility];  Service: Endoscopy;;  polyp at cecum, ascending colon polyps x3, hepatic flexure polyps x8, transverse colon polyps x8    POLYPECTOMY  09/22/2019   Procedure: POLYPECTOMY;  Surgeon: Danie Binder, MD;  [facility];  Service:  Endoscopy;;   REMOVAL RIGHT THIGH CYST  2006   SAVORY DILATION N/A 03/17/2013   Procedure: SAVORY DILATION;  Surgeon: Danie Binder, MD;  [facility];  Service: Endoscopy;  Laterality: N/A;  #12.8, 14, 15, 16 dilators used   SAVORY DILATION N/A 06/10/2015   Procedure: SAVORY DILATION;  Surgeon: Danie Binder, MD;  [facility];  Service: Endoscopy;  Laterality: N/A;   VAGINAL HYSTERECTOMY  63's    Past Family History   Family History  Problem Relation Age of Onset   Hypertension Mother    Diabetes Mother    Heart failure Mother    Dementia Mother    Emphysema Father    Hypertension Father    Diabetes Brother    GER disease Brother    Hypertension Sister    Hypertension Sister    Cancer Other        family history    Diabetes Other        family history    Heart defect Other        famiily history    Arthritis Other        family history    Anesthesia problems Neg Hx    Hypotension Neg Hx    Malignant hyperthermia Neg Hx    Pseudochol deficiency Neg Hx    Colon cancer Neg Hx     Past Social History   Social History   Socioeconomic History   Marital status: Divorced    Spouse name: Not on file   Number of children: 2   Years of education: 12   Highest education level: 12th grade  Occupational History   Occupation:  retired   Tobacco Use   Smoking status: Former    Packs/day: 1.00    Years: 20.00    Total pack years: 20.00    Types: Cigarettes    Start date: 09/21/1974    Quit date: 09/20/1988    Years since quitting: 33.6    Passive exposure: Past   Smokeless tobacco: Never  Vaping Use   Vaping Use: Never used  Substance and Sexual Activity   Alcohol use: No    Alcohol/week: 0.0 standard drinks of alcohol   Drug use: No   Sexual activity: Not Currently    Partners: Male    Birth control/protection: Surgical  Other Topics Concern   Not on file  Social History Narrative   Patient is right handed   Patient drinks some caffeine daily.   Social Determinants of Health   Financial Resource Strain: Low Risk  (04/02/2022)   Overall Financial Resource Strain (CARDIA)    Difficulty of Paying Living Expenses: Not hard at all  Food Insecurity: No Food Insecurity (04/02/2022)   Hunger Vital Sign    Worried About Running Out of Food in the Last Year: Never true    Ran Out of Food in the Last Year: Never true  Transportation Needs: No Transportation Needs (04/02/2022)   PRAPARE - Hydrologist (Medical): No    Lack of Transportation (Non-Medical): No  Physical Activity: Inactive (04/02/2022)   Exercise Vital Sign    Days of Exercise per Week: 0 days    Minutes of Exercise per Session: 0 min  Stress: No Stress Concern Present (04/02/2022)   [place]    Feeling of Stress : Not at all  Social Connections: Moderately Isolated (04/02/2022)   Social Connection and Isolation Panel [  NHANES]    Frequency of Communication with Friends and Family: More than three times a week    Frequency of Social Gatherings with Friends and Family: More than three times a week    Attends Religious Services: More than 4 times per year    Active Member of [employer] or Organizations: No    Attends Archivist Meetings:  Never    Marital Status: Divorced  Human resources officer Violence: Not At Risk (04/02/2022)   Humiliation, Afraid, Rape, and Kick questionnaire    Fear of Current or Ex-Partner: No    Emotionally Abused: No    Physically Abused: No    Sexually Abused: No    Review of Systems   General: Negative for anorexia, weight loss, fever, chills, fatigue, weakness. Eyes: Negative for vision changes.  ENT: Negative for hoarseness, difficulty swallowing , nasal congestion. CV: Negative for chest pain, angina, palpitations, dyspnea on exertion, peripheral edema.  Respiratory: Negative for dyspnea at rest, dyspnea on exertion, cough, sputum, wheezing.  GI: See history of present illness. GU:  Negative for dysuria, hematuria, urinary incontinence, urinary frequency, nocturnal urination.  MS: Negative for joint pain, low back pain.  Derm: Negative for rash or itching.  Neuro: Negative for weakness, abnormal sensation, seizure, frequent headaches, memory loss,  confusion.  Psych: Negative for anxiety, depression, suicidal ideation, hallucinations.  Endo: Negative for unusual weight change.  Heme: Negative for bruising or bleeding. Allergy: Negative for rash or hives.  Physical Exam   BP 116/69 (BP Location: Right Arm, Patient Position: Sitting, Cuff Size: Large)   Pulse (!) 105   Temp 98.5 F (36.9 C) (Oral)   Ht 5' 6.5" (1.689 m)   Wt 193 lb (87.5 kg)   SpO2 90%   BMI 30.68 kg/m    General: Well-nourished, well-developed in no acute distress.  Head: Normocephalic, atraumatic.   Eyes: Conjunctiva pink, no icterus. Mouth: Oropharyngeal mucosa moist and pink , no lesions erythema or exudate. Neck: Supple without thyromegaly, masses, or lymphadenopathy.  Lungs: Clear to auscultation bilaterally.  Heart: Regular rate and rhythm, no murmurs rubs or gallops.  Abdomen: Bowel sounds are normal, nontender, nondistended, no hepatosplenomegaly or masses,  no abdominal bruits or hernia, no rebound or  guarding.   Rectal: *** Extremities: No lower extremity edema. No clubbing or deformities.  Neuro: Alert and oriented x 4 , grossly normal neurologically.  Skin: Warm and dry, no rash or jaundice.   Psych: Alert and cooperative, normal mood and affect.  Labs   Lab Results  Component Value Date   TSH 2.160 03/21/2022   Lab Results  Component Value Date   CREATININE 1.34 (H) 03/21/2022   BUN 18 03/21/2022   NA 141 03/21/2022   K 4.7 03/21/2022   CL 103 03/21/2022   CO2 22 03/21/2022   Lab Results  Component Value Date   ALT 13 03/21/2022   AST 14 03/21/2022   ALKPHOS 99 03/21/2022   BILITOT 0.3 03/21/2022   Lab Results  Component Value Date   WBC 8.0 03/06/2022   HGB 13.3 03/06/2022   HCT 40.6 03/06/2022   MCV 100.0 03/06/2022   PLT 272 03/06/2022   Lab Results  Component Value Date   HGBA1C 6.3 (H) 03/21/2022   Lab Results  Component Value Date   IRON 75 03/06/2022   TIBC 351 03/06/2022   FERRITIN 103 03/06/2022    Imaging Studies   No results found.  Assessment       PLAN  Miralax 17 g BID, then daily.  Fine if he does not want her to have one. TCS next year???? Ask [place]. Bobby Rumpf, [place], [facility]

## 2022-05-10 ENCOUNTER — Other Ambulatory Visit: Payer: Self-pay | Admitting: Family Medicine

## 2022-05-19 ENCOUNTER — Other Ambulatory Visit: Payer: Self-pay | Admitting: Family Medicine

## 2022-05-19 DIAGNOSIS — R0902 Hypoxemia: Secondary | ICD-10-CM | POA: Diagnosis not present

## 2022-05-28 IMAGING — CT CT RENAL STONE PROTOCOL
2 of 4 series · 15 of 46 positions shown, 17 images · non-contrast
Comparison: CT the abdomen and pelvis 10/25/2017.

CLINICAL DATA: 74-year-old female with history of right-sided flank
pain and low pelvic pain.

EXAM:
CT ABDOMEN AND PELVIS WITHOUT CONTRAST
TECHNIQUE: Multidetector CT imaging of the abdomen and pelvis was performed
following the standard protocol without IV contrast.

[Series 2: axial st · axial · 0.80mm/px · z∈[+719,+1169]mm · 12 of 104 slices shown, 14 images]
[im 7/104  soft-tissue]
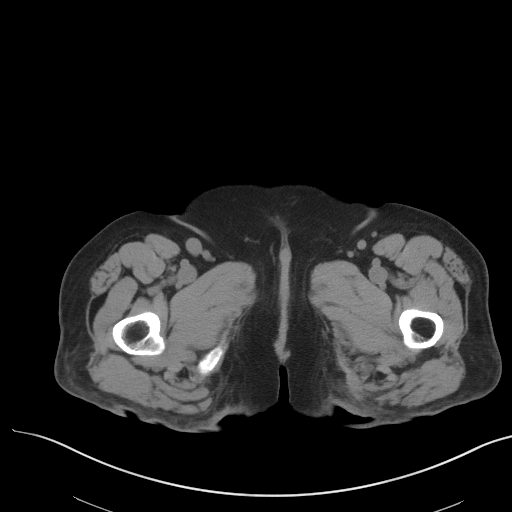
[im 7/104  bone]
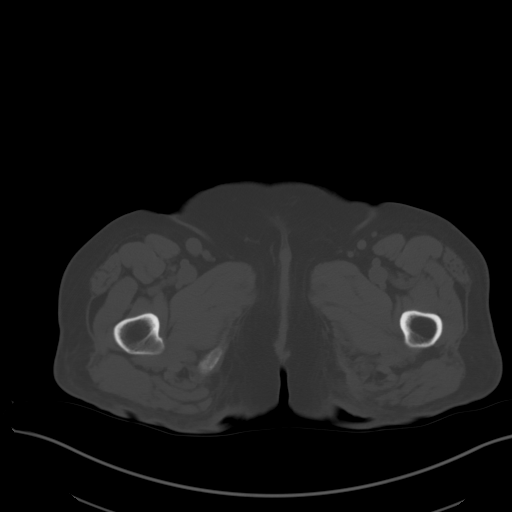
[im 14/104  soft-tissue]
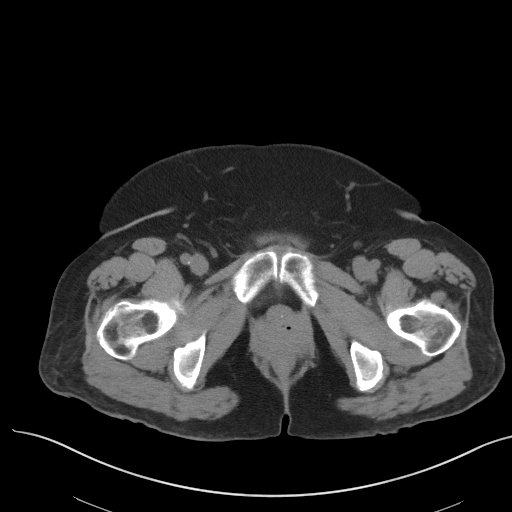
[im 21/104  soft-tissue]
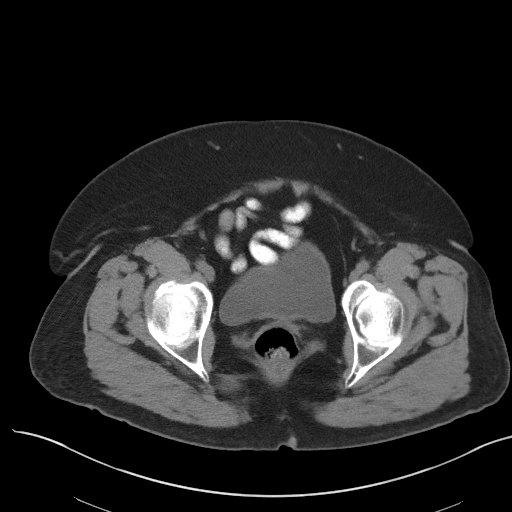
[im 35/104  soft-tissue]
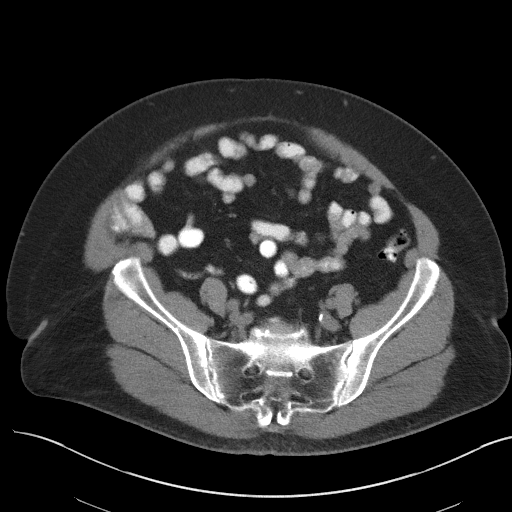
[im 42/104  soft-tissue]
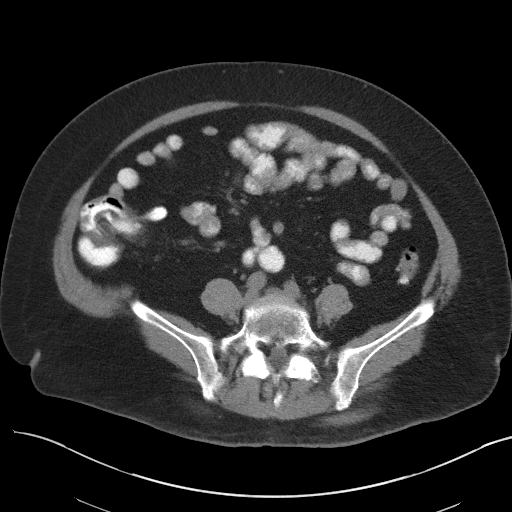
[im 49/104  soft-tissue]
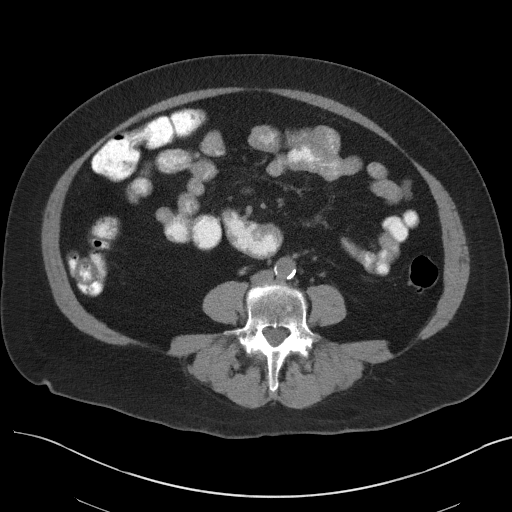
[im 55/104  soft-tissue]
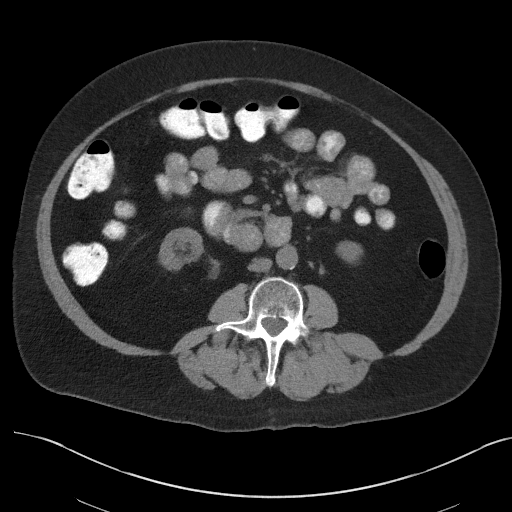
[im 62/104  soft-tissue]
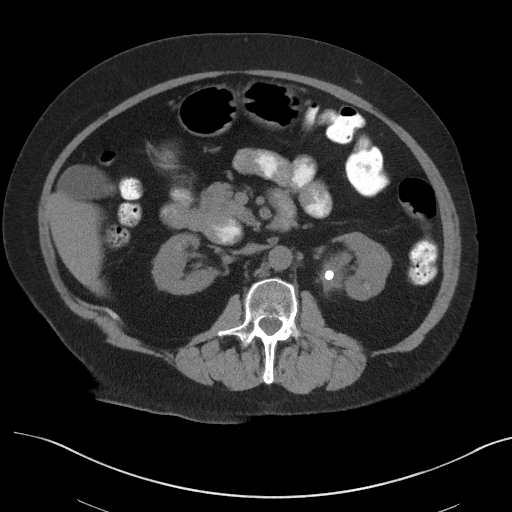
[im 69/104  soft-tissue]
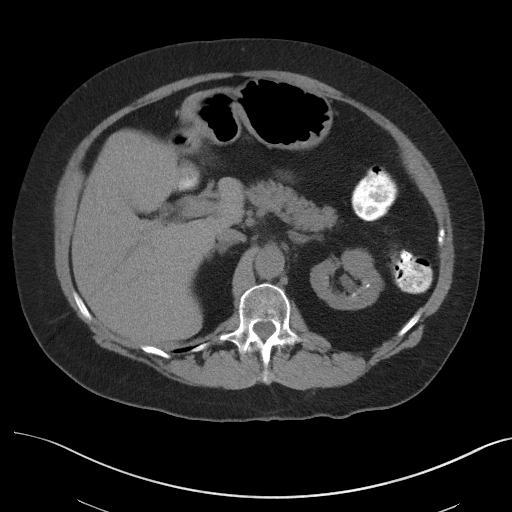
[im 69/104  bone]
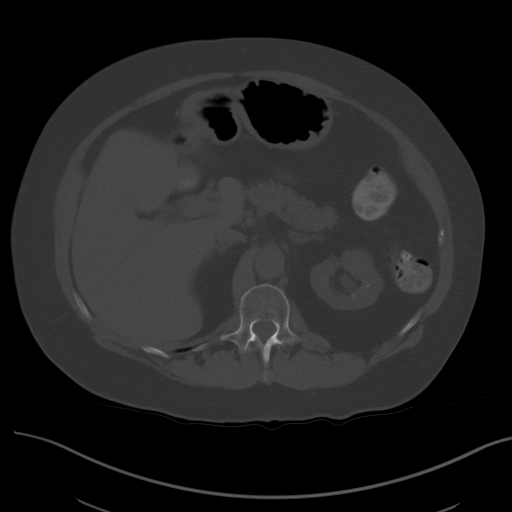
[im 83/104  soft-tissue]
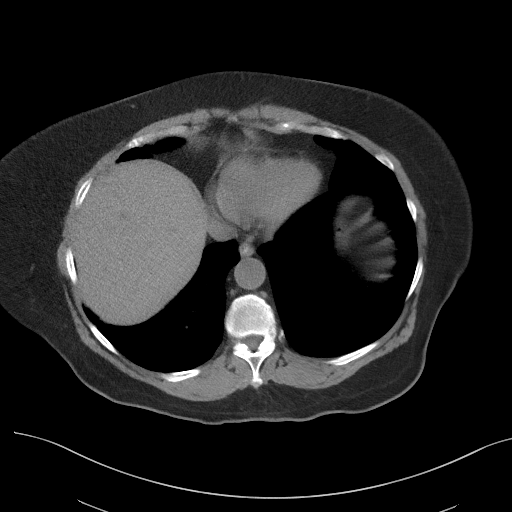
[im 90/104  soft-tissue]
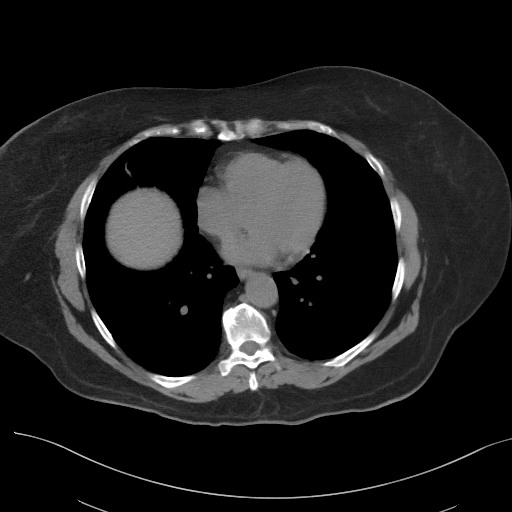
[im 97/104  soft-tissue]
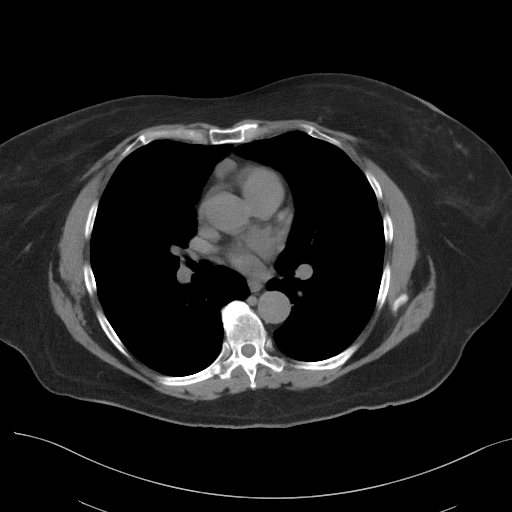

[Series 5: coronal st · coronal · 0.87mm/px · 3 of 101 slices shown]
[im 34/101  soft-tissue]
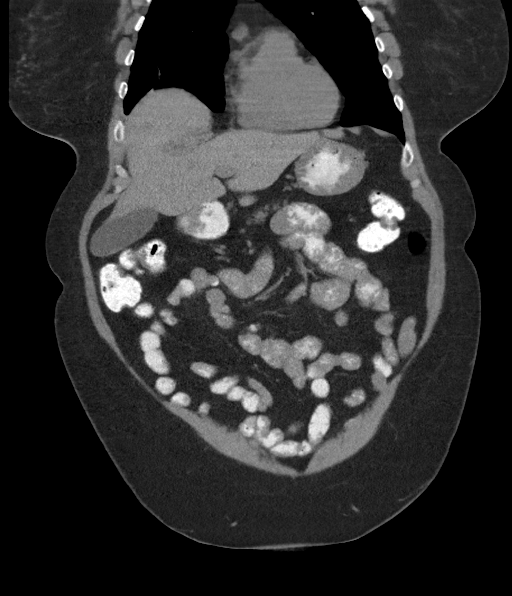
[im 45/101  soft-tissue]
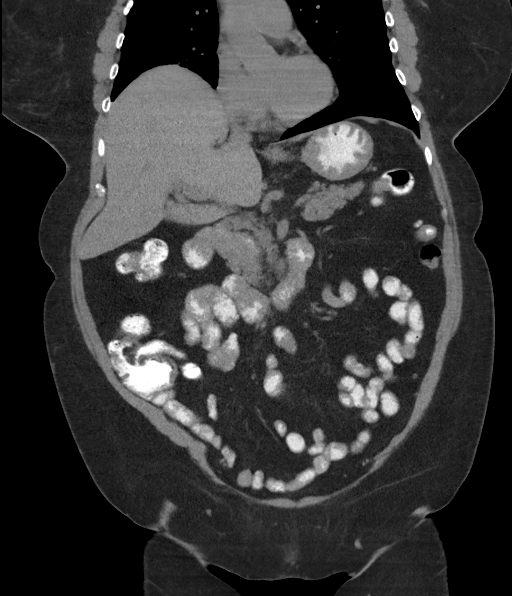
[im 56/101  soft-tissue]
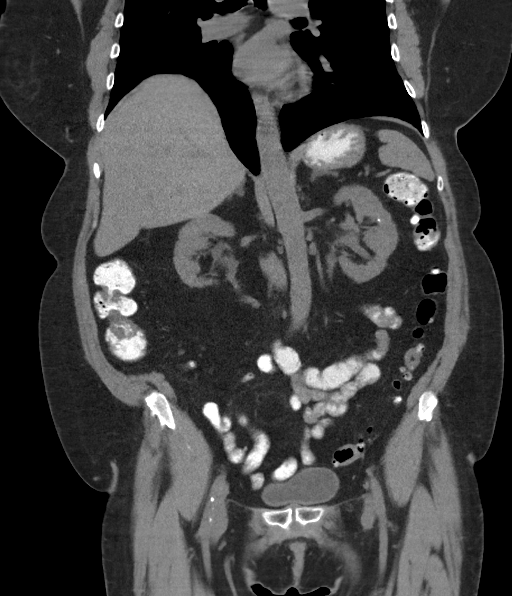

[15 of 46 positions shown; findings below may reference images not displayed]

FINDINGS: Lower chest: Linear areas of scarring in the right middle and lower
lobes. Small left-sided Bochdalek's hernia. Prominent borderline
enlarged anterior mediastinal lymph node measuring 1.1 cm in short
axis, similar to prior study from 3380, nonspecific but presumably
benign.

Hepatobiliary: No suspicious cystic or solid hepatic lesions are
confidently identified on today's noncontrast CT examination.
Unenhanced appearance of the gallbladder is unremarkable.

Pancreas: No definite pancreatic mass or peripancreatic fluid
collections or inflammatory changes are noted on today's noncontrast
CT examination.

Spleen: Unremarkable.

Adrenals/Urinary Tract: Multiple nonobstructive calculi are noted
within the left renal collecting system measuring up to 7 mm in the
upper pole. In addition, in the left renal pelvis (coronal image 60
of series 5) there is a 1 cm calculus which is associated with mild
left hydronephrosis and demonstrates surrounding urothelial
thickening and haziness suggesting acute inflammation. No additional
calculi are noted along the course of either ureter or within the
lumen of the urinary bladder. No right hydroureteronephrosis. 1.2 cm
low-attenuation lesion in the interpolar region of the left kidney,
incompletely characterized on today's non-contrast CT examination,
but statistically likely to represent a cyst. Unenhanced appearance
of the right kidney and bilateral adrenal glands is otherwise
unremarkable. Unenhanced appearance of the urinary bladder is
normal. Tiny 1 mm nonobstructive calculus in the upper pole
collecting system of the right kidney also noted.

Stomach/Bowel: Unenhanced appearance of the stomach is normal. No
pathologic dilatation of small bowel or colon. Numerous colonic
diverticulae are noted, without surrounding inflammatory changes to
suggest an acute diverticulitis at this time. Normal appendix.

Vascular/Lymphatic: Aortic atherosclerosis. No lymphadenopathy noted
in the abdomen or pelvis.

Reproductive: Status post hysterectomy.  Ovaries are atrophic.

Other: No significant volume of ascites.  No pneumoperitoneum.

Musculoskeletal: There are no aggressive appearing lytic or blastic
lesions noted in the visualized portions of the skeleton.
IMPRESSION: 1. In addition to multiple nonobstructive calculi in the collecting
systems of both kidneys (left greater than right) there is a 1 cm
calculus in the left renal pelvis which demonstrates surrounding
inflammatory changes and is associated with mild proximal
hydronephrosis indicating obstruction.
2. Aortic atherosclerosis.
3. Colonic diverticulosis without evidence of acute diverticulitis.
4. Additional incidental findings, similar to prior studies, as
above.

## 2022-06-02 IMAGING — DX DG ABDOMEN 1V
1 series · 1 of 1 positions shown · non-contrast
Comparison: 12/17/2019

CLINICAL DATA: Right lower quadrant pain for 1 month. History of
nephrolithiasis

EXAM:
ABDOMEN - 1 VIEW

[abdomen kub]
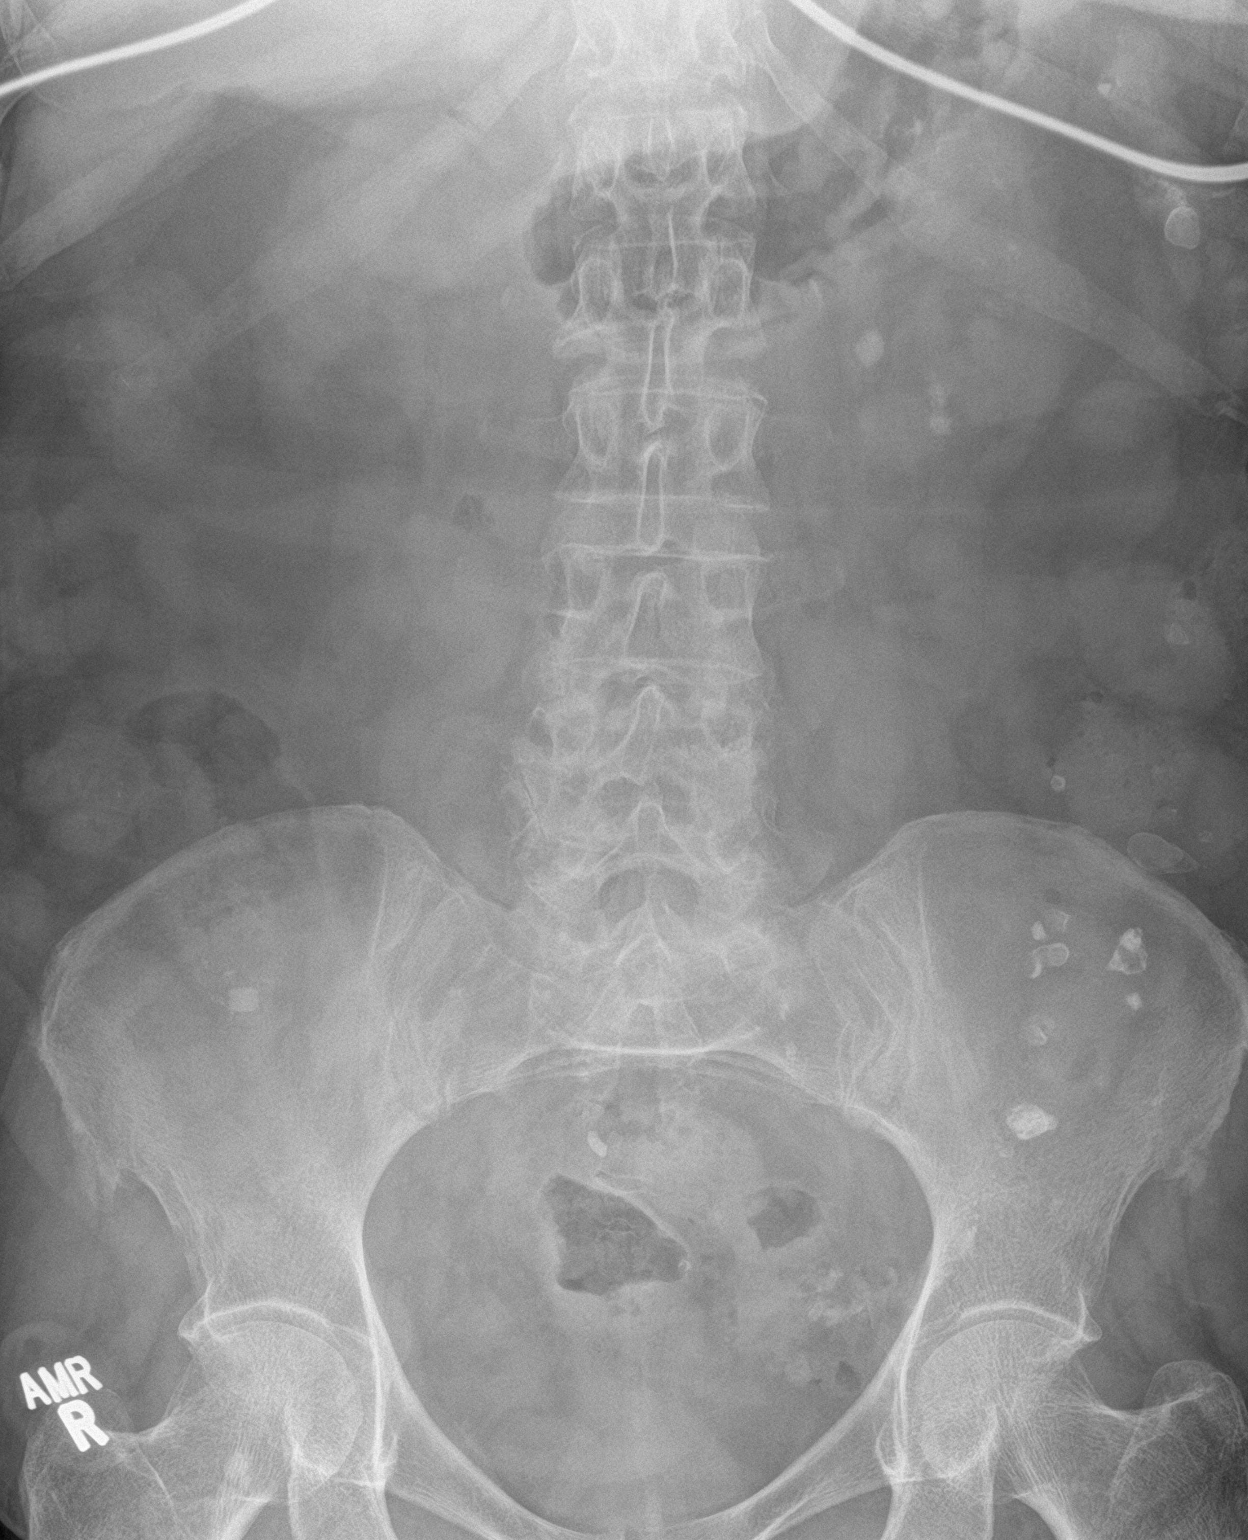

[1 of 1 positions shown; findings below may reference images not displayed]

FINDINGS: Nonobstructive bowel gas pattern. Numerous calcifications project
over the left renal shadow including a 9 mm calcification projecting
in the region of the renal pelvis seen on previous CT. No discrete
right renal calculi. Multiple nonspecific soft tissue calcifications
project over the left hemiabdomen.
IMPRESSION: Multiple left-sided renal stones including a 9 mm calcification
projecting in the region of the renal pelvis seen on previous CT.

## 2022-06-13 ENCOUNTER — Inpatient Hospital Stay: Payer: 59 | Attending: Physician Assistant

## 2022-06-13 ENCOUNTER — Other Ambulatory Visit (HOSPITAL_COMMUNITY): Payer: Medicare Other

## 2022-06-13 ENCOUNTER — Ambulatory Visit (HOSPITAL_COMMUNITY): Payer: Medicare Other

## 2022-06-13 DIAGNOSIS — C9000 Multiple myeloma not having achieved remission: Secondary | ICD-10-CM | POA: Diagnosis not present

## 2022-06-13 DIAGNOSIS — D472 Monoclonal gammopathy: Secondary | ICD-10-CM

## 2022-06-13 DIAGNOSIS — D509 Iron deficiency anemia, unspecified: Secondary | ICD-10-CM

## 2022-06-13 DIAGNOSIS — R0902 Hypoxemia: Secondary | ICD-10-CM

## 2022-06-13 LAB — CBC WITH DIFFERENTIAL/PLATELET
Abs Immature Granulocytes: 0.02 10*3/uL (ref 0.00–0.07)
Basophils Absolute: 0 10*3/uL (ref 0.0–0.1)
Basophils Relative: 0 %
Eosinophils Absolute: 0.2 10*3/uL (ref 0.0–0.5)
Eosinophils Relative: 3 %
HCT: 37.3 % (ref 36.0–46.0)
Hemoglobin: 11.7 g/dL — ABNORMAL LOW (ref 12.0–15.0)
Immature Granulocytes: 0 %
Lymphocytes Relative: 15 %
Lymphs Abs: 1.2 10*3/uL (ref 0.7–4.0)
MCH: 31.7 pg (ref 26.0–34.0)
MCHC: 31.4 g/dL (ref 30.0–36.0)
MCV: 101.1 fL — ABNORMAL HIGH (ref 80.0–100.0)
Monocytes Absolute: 0.5 10*3/uL (ref 0.1–1.0)
Monocytes Relative: 6 %
Neutro Abs: 6 10*3/uL (ref 1.7–7.7)
Neutrophils Relative %: 76 %
Platelets: 325 10*3/uL (ref 150–400)
RBC: 3.69 MIL/uL — ABNORMAL LOW (ref 3.87–5.11)
RDW: 15 % (ref 11.5–15.5)
WBC: 8 10*3/uL (ref 4.0–10.5)
nRBC: 0 % (ref 0.0–0.2)

## 2022-06-13 LAB — COMPREHENSIVE METABOLIC PANEL
ALT: 12 U/L (ref 0–44)
AST: 17 U/L (ref 15–41)
Albumin: 4.2 g/dL (ref 3.5–5.0)
Alkaline Phosphatase: 82 U/L (ref 38–126)
Anion gap: 10 (ref 5–15)
BUN: 16 mg/dL (ref 8–23)
CO2: 26 mmol/L (ref 22–32)
Calcium: 10.1 mg/dL (ref 8.9–10.3)
Chloride: 101 mmol/L (ref 98–111)
Creatinine, Ser: 1.5 mg/dL — ABNORMAL HIGH (ref 0.44–1.00)
GFR, Estimated: 36 mL/min — ABNORMAL LOW (ref >60)
Glucose, Bld: 169 mg/dL — ABNORMAL HIGH (ref 70–99)
Potassium: 3.6 mmol/L (ref 3.5–5.1)
Sodium: 137 mmol/L (ref 135–145)
Total Bilirubin: 0.6 mg/dL (ref 0.3–1.2)
Total Protein: 8.6 g/dL — ABNORMAL HIGH (ref 6.5–8.1)

## 2022-06-13 LAB — FERRITIN: Ferritin: 12 ng/mL (ref 11–307)

## 2022-06-13 LAB — IRON AND TIBC
Iron: 50 ug/dL (ref 28–170)
Saturation Ratios: 11 % (ref 10.4–31.8)
TIBC: 465 ug/dL — ABNORMAL HIGH (ref 250–450)
UIBC: 415 ug/dL

## 2022-06-13 LAB — LACTATE DEHYDROGENASE: LDH: 101 U/L (ref 98–192)

## 2022-06-14 LAB — KAPPA/LAMBDA LIGHT CHAINS
Kappa free light chain: 51.7 mg/L — ABNORMAL HIGH (ref 3.3–19.4)
Kappa, lambda light chain ratio: 3.25 — ABNORMAL HIGH (ref 0.26–1.65)
Lambda free light chains: 15.9 mg/L (ref 5.7–26.3)

## 2022-06-15 LAB — PROTEIN ELECTROPHORESIS, SERUM
A/G Ratio: 1.1 (ref 0.7–1.7)
Albumin ELP: 4.3 g/dL (ref 2.9–4.4)
Alpha-1-Globulin: 0.2 g/dL (ref 0.0–0.4)
Alpha-2-Globulin: 0.9 g/dL (ref 0.4–1.0)
Beta Globulin: 1 g/dL (ref 0.7–1.3)
Gamma Globulin: 1.9 g/dL — ABNORMAL HIGH (ref 0.4–1.8)
Globulin, Total: 4 g/dL — ABNORMAL HIGH (ref 2.2–3.9)
M-Spike, %: 1.3 g/dL — ABNORMAL HIGH
Total Protein ELP: 8.3 g/dL (ref 6.0–8.5)

## 2022-06-17 ENCOUNTER — Other Ambulatory Visit: Payer: Self-pay | Admitting: Orthopedic Surgery

## 2022-06-17 ENCOUNTER — Other Ambulatory Visit: Payer: Self-pay | Admitting: Family Medicine

## 2022-06-18 NOTE — Progress Notes (Deleted)
Cheryl Abbott, Cheryl Abbott   CLINIC:  Medical Oncology/Hematology  PCP:  Derek Jack, [place] [address] [phone]   REASON FOR VISIT:  Follow-up for IgG kappa smoldering myeloma & iron deficiency anemia   CURRENT THERAPY: Intermittent IV iron infusions (last in July 2023)  INTERVAL HISTORY:   Cheryl Abbott 77 y.o. female returns for routine follow-up of IgG kappa smoldering myeloma and her iron deficiency anemia.  She was last seen by Tarri Abernethy PA-C on 03/14/2022.   At today's visit, she reports feeling at her usual baseline.  *** No recent hospitalizations, surgeries, or changes in baseline health status.   SMOLDERING MYELOMA: She has not noted any new symptoms.  She continues to have pain in her left shoulder which she says she has seen neurosurgeon for and is related to a pinched nerve in her neck.  *** *** She denies any new bone pain or fractures.   She denies any fever, chills, night sweats, or unintentional weight loss.  She admits to occasional tinnitus as well as numbness in her feet.   No new onset hearing loss, blurred vision, headache, or dizziness.  No thromboembolic events.  No new masses or lymphadenopathy.   IRON DEFICIENCY ANEMIA: She is symptomatic with ongoing fatigue that was slightly improved by last IV iron in July 2023.  *** *** She did have gross hematuria for about 36 hours following ureteral stent removal in June 2023, but has not had any recurrent hematuria.    *** She denies any recent rectal bleeding, but has had scant hemorrhoidal bleeding when she is constipated. She denies any  hematemesis, melena, or epistaxis.     *** She has symptoms of restless legs and dyspnea on exertion.  No pica, chest pain, lightheadedness, or syncope.   She has ***% energy and ***% appetite. She endorses that she is maintaining a stable weight.   ASSESSMENT & PLAN:  1.  IgG kappa smoldering  myeloma: - Diagnosed in September 2018 after nephrology work-up found M spike in urine (8.3 mg per 24 hours) and serum (1.5 g/dL) - Immunofixation shows IgG monoclonal protein with kappa light chain specificity - Bone marrow biopsy on 02/19/2017 showed trilineage hematopoiesis, plasma cells 14%, cytogenetics normal, FISH panel normal - Skeletal survey on 02/01/2020 did not show any lytic lesions. - Skeletal survey (02/16/2021): 2 adjacent 4 mm lucencies on the right scapula at the base of the glenoid, which were not seen on prior exam.  No other focal bone lesion. - Scapula x-ray (02/21/2021): Small lucent lesions in the scapula at the base of the glenoid, better seen on skeletal survey - Skeletal survey (11/29/2021): Stable lucencies in scapula on the right, no new lytic or destructive lesion is seen. - Most recent myeloma panel (06/13/2022): M spike 1.3, stable within patient's baseline range.  Stable light chains with elevated kappa 51.7, normal lambda, elevated ratio 3.25.  LDH normal. - Additional labs (06/13/2022): Creatinine 1.50 (baseline CKD stage IIIb/IV), normal calcium 10.1, normal Hgb 11.7. - No B symptoms or new bone pain.  Chronic right leg pain, chronic left shoulder pain from pinched nerve in neck*** - Per [facility] 20/20/2 guidelines, patient is not high risk SMM, no treatment indicated at this time, only continued close observation - PLAN: Repeat myeloma panel and office visit in 3 months -- Next skeletal survey due in June 2024  2.  Normocytic anemia with iron deficiency: - She had  a colonoscopy on 09/22/2019 which showed polyps, diverticulosis, and external and internal hemorrhoids.  EGD (02/26/2017) with small hiatal hernia and mild gastritis. - Has required intermittent IV iron infusions (unable to tolerate oral iron due to severe constipation); last IV iron with Feraheme x2 on 12/18/2021 and 12/25/2021 - Most recent labs (06/13/2022): Hgb 11.7, ferritin 12, iron saturation 1% with TIBC  465 - No recent signs of GI bleeding or hematuria*** - Symptomatic with chronic fatigue and dyspnea on exertion   *** - PLAN: Recommend IV Feraheme x 2. - Repeat CBC and iron panel in 3 months.   PLAN SUMMARY: >> Labs in 3 months (CBC/D, CMP, SPEP, light chains, LDH, ferritin, iron/TIBC) >> OFFICE visit in 3 months, 1 week after labs >> ***   REVIEW OF SYSTEMS: ***  Review of Systems - Oncology   PHYSICAL EXAM:  ECOG PERFORMANCE STATUS: {CHL ONC ECOG IO:[id number]} *** There were no vitals filed for this visit. There were no vitals filed for this visit. Physical Exam  PAST MEDICAL/SURGICAL HISTORY:  Past Medical History:  Diagnosis Date   Anxiety disorder    Arthritis    Chronic back pain    Chronic pain of both shoulders    per pt told due to cervical spine pinched nerve   CKD (chronic kidney disease), stage III [facility])    nephrologist--- dr Theador Hawthorne;   due to tubular necrosis   Claustrophobia    Depression    Edema of both lower extremities    GERD (gastroesophageal reflux disease)    Headache    Hemorrhoids    w/ intermittant bleeding   History of adenomatous polyp of colon    History of kidney stones    Hx-TIA (transient ischemic attack) 02/26/2016   per pt no residual   Hydronephrosis, left    followed by urologist--- dr Diona Fanti;   persistant   Hyperlipidemia    Hypertension    Iron deficiency    followed by [facility] w/ hx iron infusions   Kappa light chain myeloma ([place]) 02/2017   oncologist--- [facility];  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, active survillence   Lactose intolerance in adult 02/01/2016   Left ureteral calculus    Lumbar disc disease with radiculopathy    Nephrolithiasis    per CT 08-11-2021  left > right   Neuropathy, peripheral    Nocturia more than twice per night    Normocytic anemia    OSA (obstructive sleep apnea)    per study 2007;   per pt no Bipap use since 2021   Renal atrophy, bilateral    Renal cyst,  right    Sciatica    Sigmoid diverticulosis    Type 2 diabetes mellitus ([place])    Past Surgical History:  Procedure Laterality Date   BIOPSY N/A 03/17/2013   Procedure: GASTRIC BIOPSIES;  Surgeon: Danie Binder, MD;  Location: [facility];  Service: Endoscopy;  Laterality: N/A;   BIOPSY  06/10/2015   Procedure: BIOPSY;  Surgeon: Danie Binder, MD;  [facility];  Service: Endoscopy;;  gastric biopsy   CARDIAC CATHETERIZATION  05/11/2003   @ [facility] by dr t. Claiborne Billings;  Abnormal cardiolite/   Normal coronary arteries and normal LVF,  ef  63%   CARDIOVASCULAR STRESS TEST  01/01/2014   normal lexiscan cardiolite/  no ischemia/ infarct/  normal LV function and wall motion ,  ef 81%   COLONOSCOPY WITH PROPOFOL N/A 02/26/2017  Procedure: COLONOSCOPY WITH PROPOFOL;  Surgeon: Danie Binder, MD;  [facility];  Service: Endoscopy;  Laterality: N/A;  11:00am   COLONOSCOPY WITH PROPOFOL N/A 09/22/2019   Procedure: COLONOSCOPY WITH PROPOFOL;  Surgeon: Danie Binder, MD;  [facility];  Service: Endoscopy;  Laterality: N/A;  1:15   CYSTO/   RIGHT URETEROSOCOPY LASER LITHOTRIPSY STONE EXTRACTION  10/13/2004   CYSTO/  RIGHT RETROGRADE PYELOGRAM/  PLACMENT RIGHT URETERAL STENT  01/10/2010   CYSTOSCOPY W/ URETERAL STENT PLACEMENT Right 08/19/2015   Procedure: CYSTOSCOPY WITH RETROGRADE PYELOGRAM/URETERAL STENT PLACEMENT;  Surgeon: Franchot Gallo, MD;  Location: [facility];  Service: Urology;  Laterality: Right;   CYSTOSCOPY W/ URETERAL STENT PLACEMENT Left 02/23/2020   Procedure: CYSTOSCOPY LEFT  RETROGRADE PYELOGRAM LEFT URETEROSCOPY URETERAL STENT PLACEMENT;  Surgeon: Franchot Gallo, MD;  Location: [facility];  Service: Urology;  Laterality: Left;   CYSTOSCOPY WITH RETROGRADE PYELOGRAM, URETEROSCOPY AND STENT PLACEMENT Left 10/21/2014   Procedure: CYSTOSCOPY WITH RETROGRADE PYELOGRAM, URETEROSCOPY AND STENT PLACEMENT;  Surgeon: Franchot Gallo, MD;   Location[facility];  Service: Urology;  Laterality: Left;   CYSTOSCOPY WITH RETROGRADE PYELOGRAM, URETEROSCOPY AND STENT PLACEMENT Right 11/22/2015   Procedure: CYSTOSCOPY, RIGHT URETERAL STENT REMOVAL; RIGHT RETROGRADE PYELOGRAM, RIGHT URETEROSCOPY WITH BALLOON DILATION; RIGHT URETERAL STENT PLACEMENT;  Surgeon: Franchot Gallo, MD;  [facility]: [facility];  Service: Urology;  Laterality: Right;   CYSTOSCOPY WITH RETROGRADE PYELOGRAM, URETEROSCOPY AND STENT PLACEMENT Left 10/18/2020   Procedure: CYSTOSCOPY WITH LEFT RETROGRADE PYELOGRAM, LEFT URETEROSCOPY  WITH LASER AND LEFT URETERAL STENT PLACEMENT;  Surgeon: Franchot Gallo, MD;  [facility];  Service: Urology;  Laterality: Left;   CYSTOSCOPY WITH RETROGRADE PYELOGRAM, URETEROSCOPY AND STENT PLACEMENT Left 10/26/2021   Procedure: CYSTOSCOPY WITH RETROGRADE PYELOGRAM, URETEROSCOPY, STONE EXTRACTION  AND STENT PLACEMENT;  Surgeon: Franchot Gallo, MD;  Location: [facility];  Service: Urology;  Laterality: Left;   CYSTOSCOPY/RETROGRADE/URETEROSCOPY/STONE EXTRACTION WITH BASKET Bilateral 08/02/2015   Procedure: CYSTOSCOPY; BILATERAL RETROGRADE PYELOGRAMS; BILATERAL URETEROSCOPY, STONE EXTRACTION WITH BASKET;  Surgeon: Franchot Gallo, MD;  [facility];  Service: Urology;  Laterality: Bilateral;   CYSTOSCOPY/RETROGRADE/URETEROSCOPY/STONE EXTRACTION WITH BASKET Right 06/28/2015   Procedure: CYSTOSCOPY/RETROGRADE/URETEROSCOPY/STONE EXTRACTION WITH BASKET, RIGHT URETERAL DOUBLE J STENT PLACEMENT;  Surgeon: Franchot Gallo, MD;  [facility];  Service: Urology;  Laterality: Right;   ESOPHAGOGASTRODUODENOSCOPY (EGD) WITH PROPOFOL N/A 03/17/2013   Procedure: ESOPHAGOGASTRODUODENOSCOPY (EGD) WITH PROPOFOL;  Surgeon: Danie Binder, MD;  [facility];  Service: Endoscopy;  Laterality: N/A;   ESOPHAGOGASTRODUODENOSCOPY (EGD) WITH PROPOFOL N/A 06/10/2015   Distal gastritis, distal esophageal stricture s/p  dilation   ESOPHAGOGASTRODUODENOSCOPY (EGD) WITH PROPOFOL N/A 02/26/2017   Procedure: ESOPHAGOGASTRODUODENOSCOPY (EGD) WITH PROPOFOL;  Surgeon: Danie Binder, MD;  [facility];  Service: Endoscopy;  Laterality: N/A;   FLEXIBLE SIGMOIDOSCOPY  09/11/2011   IRC:VELFYBOF Internal hemorrhoids   HOLMIUM LASER APPLICATION Left 75/03/2584   Procedure: HOLMIUM LASER APPLICATION;  Surgeon: Franchot Gallo, MD;  [facility];  Service: Urology;  Laterality: Left;   HOLMIUM LASER APPLICATION Right 27/78/2423   Procedure: HOLMIUM LASER APPLICATION;  Surgeon: Franchot Gallo, MD;  [facility];  Service: Urology;  Laterality: Right;   HOLMIUM LASER APPLICATION  53/61/4431   Procedure: HOLMIUM LASER APPLICATION;  Surgeon: Franchot Gallo, MD;  [facility];  Service: Urology;;   HOLMIUM LASER APPLICATION Left 54/00/8676   Procedure: HOLMIUM LASER APPLICATION;  Surgeon: Franchot Gallo, MD;  [facility];  Service: Urology;  Laterality: Left;   MASS EXCISION Left 03/04/2015   Procedure: EXCISION OF SOFT TISSUE NEOPLASM LEFT ARM;  Surgeon: Aviva Signs, MD;  Location: [facility];  Service: General;  Laterality: Left;   PERCUTANEOUS NEPHROSTOLITHOTOMY Bilateral 05/2010   '@[facility]'$    POLYPECTOMY N/A 03/17/2013   Procedure: GASTRIC POLYPECTOMY;  Surgeon: Danie Binder, MD;  [facility];  Service: Endoscopy;  Laterality: N/A;   POLYPECTOMY  02/26/2017   Procedure: POLYPECTOMY;  Surgeon: Danie Binder, MD;  [facility];  Service: Endoscopy;;  polyp at cecum, ascending colon polyps x3, hepatic flexure polyps x8, transverse colon polyps x8    POLYPECTOMY  09/22/2019   Procedure: POLYPECTOMY;  Surgeon: Danie Binder, MD;  [facility];  Service: Endoscopy;;   REMOVAL RIGHT THIGH CYST  2006   SAVORY DILATION N/A 03/17/2013   Procedure: SAVORY DILATION;  Surgeon: Danie Binder, MD;  [facility];  Service: Endoscopy;   Laterality: N/A;  #12.8, 14, 15, 16 dilators used   SAVORY DILATION N/A 06/10/2015   Procedure: SAVORY DILATION;  Surgeon: Danie Binder, MD;  [facility];  Service: Endoscopy;  Laterality: N/A;   VAGINAL HYSTERECTOMY  1970's    SOCIAL HISTORY:  Social History   Socioeconomic History   Marital status: Divorced    Spouse name: Not on file   Number of children: 2   Years of education: 12   Highest education level: 12th grade  Occupational History   Occupation: retired   Tobacco Use   Smoking status: Former    Packs/day: 1.00    Years: 20.00    Total pack years: 20.00    Types: Cigarettes    Start date: 09/21/1974    Quit date: 09/20/1988    Years since quitting: 33.7    Passive exposure: Past   Smokeless tobacco: Never  Vaping Use   Vaping Use: Never used  Substance and Sexual Activity   Alcohol use: No    Alcohol/week: 0.0 standard drinks of alcohol   Drug use: No   Sexual activity: Not Currently    Partners: Male    Birth control/protection: Surgical  Other Topics Concern   Not on file  Social History Narrative   Patient is right handed   Patient drinks some caffeine daily.   Social Determinants of Health   Financial Resource Strain: Low Risk  (04/02/2022)   Overall Financial Resource Strain (CARDIA)    Difficulty of Paying Living Expenses: Not hard at all  Food Insecurity: No Food Insecurity (04/02/2022)   Hunger Vital Sign    Worried About Running Out of Food in the Last Year: Never true    Ran Out of Food in the Last Year: Never true  Transportation Needs: No Transportation Needs (04/02/2022)   PRAPARE - Hydrologist (Medical): No    Lack of Transportation (Non-Medical): No  Physical Activity: Inactive (04/02/2022)   Exercise Vital Sign    Days of Exercise per Week: 0 days    Minutes of Exercise per Session: 0 min  Stress: No Stress Concern Present (04/02/2022)   [place]    Feeling of Stress : Not at all  Social Connections: Moderately Isolated (04/02/2022)   Social Connection and Isolation Panel [NHANES]    Frequency of Communication with Friends and Family: More than three times a week    Frequency of Social Gatherings with Friends and Family: More than  three times a week    Attends Religious Services: More than 4 times per year    Active Member of Clubs or Organizations: No    Attends Archivist Meetings: Never    Marital Status: Divorced  Human resources officer Violence: Not At Risk (04/02/2022)   Humiliation, Afraid, Rape, and Kick questionnaire    Fear of Current or Ex-Partner: No    Emotionally Abused: No    Physically Abused: No    Sexually Abused: No    FAMILY HISTORY:  Family History  Problem Relation Age of Onset   Hypertension Mother    Diabetes Mother    Heart failure Mother    Dementia Mother    Emphysema Father    Hypertension Father    Diabetes Brother    GER disease Brother    Hypertension Sister    Hypertension Sister    Cancer Other        family history    Diabetes Other        family history    Heart defect Other        famiily history    Arthritis Other        family history    Anesthesia problems Neg Hx    Hypotension Neg Hx    Malignant hyperthermia Neg Hx    Pseudochol deficiency Neg Hx    Colon cancer Neg Hx     CURRENT MEDICATIONS:  Outpatient Encounter Medications as of 06/19/2022  Medication Sig   acetaminophen (TYLENOL) 500 MG tablet Take 1,000 mg by mouth every 6 (six) hours as needed for mild pain.   amLODipine (NORVASC) 10 MG tablet TAKE 1 TABLET BY MOUTH EVERY DAY   aspirin EC 81 MG tablet Take 81 mg by mouth daily. Swallow whole.   diclofenac Sodium (VOLTAREN) 1 % GEL APPLY 4 G TOPICALLY 4 (FOUR) TIMES DAILY AS NEEDED (PAIN).   diphenhydramine-acetaminophen (TYLENOL PM) 25-500 MG TABS tablet Take 2 tablets by mouth at bedtime.   Fluticasone-Umeclidin-Vilant  (TRELEGY ELLIPTA) 100-62.5-25 MCG/ACT AEPB Inhale 1 puff into the lungs daily.   gabapentin (NEURONTIN) 300 MG capsule Take 300 mg by mouth at bedtime.   nitroGLYCERIN (NITROSTAT) 0.4 MG SL tablet Place 1 tablet (0.4 mg total) under the tongue every 5 (five) minutes as needed for chest pain. Up to 3 tablets, then call 911.   OneTouch Delica Lancets 08M MISC Once daily testing dx e11.9   ONETOUCH ULTRA test strip USE AS INSTRUCTED ONCE DAILY DX E11.9   pantoprazole (PROTONIX) 40 MG tablet TAKE 1 TABLET BY MOUTH TWICE A DAY   PARoxetine (PAXIL) 40 MG tablet TAKE 1 TABLET (40 MG TOTAL) BY MOUTH IN THE MORNING   rosuvastatin (CRESTOR) 20 MG tablet TAKE 1 TABLET BY MOUTH EVERY DAY (Patient taking differently: Take 20 mg by mouth.)   SYMBICORT 80-4.5 MCG/ACT inhaler INHALE 2 PUFFS INTO THE LUNGS TWICE A DAY   TRADJENTA 5 MG TABS tablet TAKE 1 TABLET BY MOUTH EVERY DAY   triamcinolone cream (KENALOG) 0.1 % Apply 1 application topically 2 (two) times daily. (Patient taking differently: Apply 1 application  topically 2 (two) times daily as needed.)   TRULICITY 5.78 IO/9.6EX SOPN INJECT 0.75 MG INTO THE SKIN WEEKLY   UNABLE TO FIND Diabetic shoes x 1 pair, inserts x 3 pair  DX E11.9   VITAMIN D, CHOLECALCIFEROL, PO Take 1 tablet by mouth daily.   No facility-administered encounter medications on file as of 06/19/2022.    ALLERGIES:  Allergies  Allergen Reactions   Ace Inhibitors Cough    Patient doesn't recall   Keflex [Cephalexin] Diarrhea and Nausea And Vomiting   Nitrofurantoin Diarrhea and Nausea And Vomiting   Penicillins Hives and Other (See Comments)    Reaction:  Blisters on hands/feet  Has patient had a PCN reaction causing immediate rash, facial/tongue/throat swelling, SOB or lightheadedness with hypotension: No Has patient had a PCN reaction causing severe rash involving mucus membranes or skin necrosis: No Has patient had a PCN reaction that required hospitalization No Has patient had  a PCN reaction occurring within the last 10 years: No If all of the above answers are "NO", then may proceed with Cephalosporin use.    LABORATORY DATA:  I have reviewed the labs as listed.  CBC    Component Value Date/Time   WBC 8.0 06/13/2022 1117   RBC 3.69 (L) 06/13/2022 1117   HGB 11.7 (L) 06/13/2022 1117   HCT 37.3 06/13/2022 1117   PLT 325 06/13/2022 1117   MCV 101.1 (H) 06/13/2022 1117   MCH 31.7 06/13/2022 1117   MCHC 31.4 06/13/2022 1117   RDW 15.0 06/13/2022 1117   LYMPHSABS 1.2 06/13/2022 1117   MONOABS 0.5 06/13/2022 1117   EOSABS 0.2 06/13/2022 1117   BASOSABS 0.0 06/13/2022 1117      Latest Ref Rng & Units 06/13/2022   11:17 AM 03/21/2022    9:41 AM 03/06/2022   11:04 AM  CMP  Glucose 70 - 99 mg/dL 169  115  116   BUN 8 - 23 mg/dL '16  18  14   '$ Creatinine 0.44 - 1.00 mg/dL 1.50  1.34  1.69   Sodium 135 - 145 mmol/L 137  141  136   Potassium 3.5 - 5.1 mmol/L 3.6  4.7  3.9   Chloride 98 - 111 mmol/L 101  103  103   CO2 22 - 32 mmol/L '26  22  24   '$ Calcium 8.9 - 10.3 mg/dL 10.1  10.3  9.8   Total Protein 6.5 - 8.1 g/dL 8.6  8.3  8.9   Total Bilirubin 0.3 - 1.2 mg/dL 0.6  0.3  0.4   Alkaline Phos 38 - 126 U/L 82  99  82   AST 15 - 41 U/L '17  14  15   '$ ALT 0 - 44 U/L '12  13  16     '$ DIAGNOSTIC IMAGING:  I have independently reviewed the relevant imaging and discussed with the patient.   WRAP UP:  All questions were answered. The patient knows to call the clinic with any problems, questions or concerns.  Medical decision making: ***  Time spent on visit: I spent *** minutes counseling the patient face to face. The total time spent in the appointment was *** minutes and more than 50% was on counseling.  Harriett Rush, PA-C  ***

## 2022-06-19 ENCOUNTER — Telehealth: Payer: Self-pay | Admitting: Orthopedic Surgery

## 2022-06-19 ENCOUNTER — Inpatient Hospital Stay: Payer: 59 | Admitting: Physician Assistant

## 2022-06-19 DIAGNOSIS — R0902 Hypoxemia: Secondary | ICD-10-CM | POA: Diagnosis not present

## 2022-06-19 DIAGNOSIS — M25512 Pain in left shoulder: Secondary | ICD-10-CM

## 2022-06-21 ENCOUNTER — Encounter (HOSPITAL_COMMUNITY): Payer: Self-pay | Admitting: Hematology

## 2022-06-25 ENCOUNTER — Ambulatory Visit (HOSPITAL_COMMUNITY)
Admission: RE | Admit: 2022-06-25 | Discharge: 2022-06-25 | Disposition: A | Discharge status: Home / Self Care | Payer: 59 | Source: Ambulatory Visit | Attending: Family Medicine | Admitting: Family Medicine

## 2022-06-25 DIAGNOSIS — Z1231 Encounter for screening mammogram for malignant neoplasm of breast: Secondary | ICD-10-CM | POA: Diagnosis not present

## 2022-06-28 ENCOUNTER — Encounter: Payer: Self-pay | Admitting: Orthopedic Surgery

## 2022-06-28 ENCOUNTER — Ambulatory Visit (INDEPENDENT_AMBULATORY_CARE_PROVIDER_SITE_OTHER): Payer: 59 | Admitting: Orthopedic Surgery

## 2022-06-28 DIAGNOSIS — M25512 Pain in left shoulder: Secondary | ICD-10-CM | POA: Diagnosis not present

## 2022-06-28 DIAGNOSIS — M25511 Pain in right shoulder: Secondary | ICD-10-CM | POA: Diagnosis not present

## 2022-06-28 DIAGNOSIS — G8929 Other chronic pain: Secondary | ICD-10-CM

## 2022-06-28 MED ORDER — GABAPENTIN 300 MG PO CAPS: 300.0000 mg | ORAL_CAPSULE | Freq: Three times a day (TID) | ORAL | 0 refills | Status: DC | PRN

## 2022-06-28 MED ORDER — METHYLPREDNISOLONE ACETATE 40 MG/ML IJ SUSP
40.0000 mg | Freq: Once | INTRAMUSCULAR | Status: AC
  Administered 2022-06-28: 40 mg via INTRA_ARTICULAR

## 2022-06-28 NOTE — Addendum Note (Signed)
Addended by: Elizabeth Sauer on: 06/28/2022 03:49 PM   Modules accepted: Orders

## 2022-06-28 NOTE — Progress Notes (Signed)
FOLLOW UP   Encounter Diagnoses  Name Primary?   Chronic left shoulder pain Yes   Chronic right shoulder pain      Chief Complaint  Patient presents with   Shoulder Pain    Left / felt better after injection last visit    77 year old female with cervical disc disease bilateral shoulder pain previously seen by neurosurgery who recommended gabapentin comes in with bilateral shoulder pain left worse than right improved with injection  Prior MRI showed nonsurgical disease in the rotator cuffs  Reexamination reveals painful forward elevation and abduction flexion with pain also with external rotation of both shoulders with palpable tenderness in the trapezius muscle and lateral deltoid  PRIOR TREATMENT: injections   Since she is not a surgical candidate I recommend injections and repeat dosing of gabapentin  Meds ordered this encounter  Medications   gabapentin (NEURONTIN) 300 MG capsule    Sig: Take 1 capsule (300 mg total) by mouth 3 (three) times daily as needed.    Dispense:  90 capsule    Refill:  0     Procedure injection right shoulder subacromial joint and left shoulder subacromial joint   procedure note the subacromial injection shoulder RIGHT  Verbal consent was obtained to inject the  RIGHT   Shoulder  Timeout was completed to confirm the injection site is a subacromial space of the  RIGHT  shoulder   Medication used Depo-Medrol 40 mg and lidocaine 1% 3 cc  Anesthesia was provided by ethyl chloride  The injection was performed in the RIGHT  posterior subacromial space. After pinning the skin with alcohol and anesthetized the skin with ethyl chloride the subacromial space was injected using a 20-gauge needle. There were no complications  Sterile dressing was applied.    Procedure note the subacromial injection shoulder left   Verbal consent was obtained to inject the  Left   Shoulder  Timeout was completed to confirm the injection site is a subacromial  space of the  left  shoulder  Medication used Depo-Medrol 40 mg and lidocaine 1% 3 cc  Anesthesia was provided by ethyl chloride  The injection was performed in the left  posterior subacromial space. After pinning the skin with alcohol and anesthetized the skin with ethyl chloride the subacromial space was injected using a 20-gauge needle. There were no complications  Sterile dressing was applied.

## 2022-06-28 NOTE — Patient Instructions (Signed)

## 2022-07-03 ENCOUNTER — Telehealth: Payer: Self-pay | Admitting: Orthopedic Surgery

## 2022-07-03 NOTE — Telephone Encounter (Signed)
Patient lvm stating she was seen last week and got an injection in both shoulders.  She stated her left shoulder is hurting her so bad, pain going down in her arm.  Before she schedules she wants to know if he thinks there is anything else he can do for her.  Pt's # [phone]

## 2022-07-03 NOTE — Telephone Encounter (Signed)
I called her and told her she should get in contact with the doctor about her neck she said she called [facility] and they told her she needs to pick another doctor since hers left she said she needs to call them back and she will do that today  I told her I did send message to Dr Aline Brochure as well if anything else he can offer he will let us know.

## 2022-07-06 ENCOUNTER — Telehealth: Payer: Self-pay | Admitting: Family Medicine

## 2022-07-06 ENCOUNTER — Ambulatory Visit (INDEPENDENT_AMBULATORY_CARE_PROVIDER_SITE_OTHER): Payer: 59

## 2022-07-06 VITALS — Ht 66.0 in | Wt 187.0 lb

## 2022-07-06 DIAGNOSIS — Z0000 Encounter for general adult medical examination without abnormal findings: Secondary | ICD-10-CM | POA: Diagnosis not present

## 2022-07-06 NOTE — Telephone Encounter (Signed)
Pt wants to know if she can please get an MRI of her left shoulder

## 2022-07-06 NOTE — Progress Notes (Signed)
Subjective:   Cheryl Abbott is a 77 y.o. female who presents for Medicare Annual (Subsequent) preventive examination.  Review of Systems     Objective:    There were no vitals filed for this visit. There is no height or weight on file to calculate BMI.     04/02/2022    3:32 PM 03/14/2022    9:14 AM 12/25/2021    2:34 PM 12/18/2021    1:45 PM 12/06/2021   10:54 AM 11/19/2021    7:07 PM 10/26/2021    1:06 PM  Advanced Directives  Does Patient Have a Medical Advance Directive? No No Yes Yes Yes No Yes  Type of Advance Directive   Living will Living will Living will  [place];Living will  Does patient want to make changes to medical advance directive?    No - Patient declined No - Patient declined    Copy of [place] in Chart?   No - copy requested No - copy requested     Would patient like information on creating a medical advance directive? No - Patient declined No - Patient declined No - Patient declined No - Patient declined No - Patient declined No - Patient declined     Current Medications (verified) Outpatient Encounter Medications as of 07/06/2022  Medication Sig   acetaminophen (TYLENOL) 500 MG tablet Take 1,000 mg by mouth every 6 (six) hours as needed for mild pain.   amLODipine (NORVASC) 10 MG tablet TAKE 1 TABLET BY MOUTH EVERY DAY   aspirin EC 81 MG tablet Take 81 mg by mouth daily. Swallow whole.   diclofenac Sodium (VOLTAREN) 1 % GEL APPLY 4 G TOPICALLY 4 (FOUR) TIMES DAILY AS NEEDED (PAIN).   diphenhydramine-acetaminophen (TYLENOL PM) 25-500 MG TABS tablet Take 2 tablets by mouth at bedtime.   Fluticasone-Umeclidin-Vilant (TRELEGY ELLIPTA) 100-62.5-25 MCG/ACT AEPB Inhale 1 puff into the lungs daily.   gabapentin (NEURONTIN) 300 MG capsule Take 1 capsule (300 mg total) by mouth 3 (three) times daily as needed.   nitroGLYCERIN (NITROSTAT) 0.4 MG SL tablet Place 1 tablet (0.4 mg total) under the tongue every 5 (five) minutes as  needed for chest pain. Up to 3 tablets, then call 911.   OneTouch Delica Lancets 16W MISC Once daily testing dx e11.9   ONETOUCH ULTRA test strip USE AS INSTRUCTED ONCE DAILY DX E11.9   pantoprazole (PROTONIX) 40 MG tablet TAKE 1 TABLET BY MOUTH TWICE A DAY   PARoxetine (PAXIL) 40 MG tablet TAKE 1 TABLET (40 MG TOTAL) BY MOUTH IN THE MORNING   rosuvastatin (CRESTOR) 20 MG tablet TAKE 1 TABLET BY MOUTH EVERY DAY (Patient taking differently: Take 20 mg by mouth.)   SYMBICORT 80-4.5 MCG/ACT inhaler INHALE 2 PUFFS INTO THE LUNGS TWICE A DAY   TRADJENTA 5 MG TABS tablet TAKE 1 TABLET BY MOUTH EVERY DAY   traMADol (ULTRAM) 50 MG tablet TAKE 1 TABLET BY MOUTH EVERY 6 HOURS AS NEEDED   triamcinolone cream (KENALOG) 0.1 % Apply 1 application topically 2 (two) times daily. (Patient taking differently: Apply 1 application  topically 2 (two) times daily as needed.)   TRULICITY 7.37 TG/6.2IR SOPN INJECT 0.75 MG INTO THE SKIN WEEKLY   UNABLE TO FIND Diabetic shoes x 1 pair, inserts x 3 pair  DX E11.9   VITAMIN D, CHOLECALCIFEROL, PO Take 1 tablet by mouth daily.   No facility-administered encounter medications on file as of 07/06/2022.    Allergies (verified) Ace inhibitors,  Keflex [cephalexin], Nitrofurantoin, and Penicillins   History: Past Medical History:  Diagnosis Date   Anxiety disorder    Arthritis    Chronic back pain    Chronic pain of both shoulders    per pt told due to cervical spine pinched nerve   CKD (chronic kidney disease), stage III [facility])    nephrologist--- dr Theador Hawthorne;   due to tubular necrosis   Claustrophobia    Depression    Edema of both lower extremities    GERD (gastroesophageal reflux disease)    Headache    Hemorrhoids    w/ intermittant bleeding   History of adenomatous polyp of colon    History of kidney stones    Hx-TIA (transient ischemic attack) 02/26/2016   per pt no residual   Hydronephrosis, left    followed by urologist--- dr Diona Fanti;   persistant    Hyperlipidemia    Hypertension    Iron deficiency    followed by [facility] w/ hx iron infusions   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   oncologist--- [facility];  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, active survillence   Lactose intolerance in adult 02/01/2016   Left ureteral calculus    Lumbar disc disease with radiculopathy    Nephrolithiasis    per CT 08-11-2021  left > right   Neuropathy, peripheral    Nocturia more than twice per night    Normocytic anemia    OSA (obstructive sleep apnea)    per study 2007;   per pt no Bipap use since 2021   Renal atrophy, bilateral    Renal cyst, right    Sciatica    Sigmoid diverticulosis    Type 2 diabetes mellitus ([place])    Past Surgical History:  Procedure Laterality Date   BIOPSY N/A 03/17/2013   Procedure: GASTRIC BIOPSIES;  Surgeon: Danie Binder, MD;  Location: [facility];  Service: Endoscopy;  Laterality: N/A;   BIOPSY  06/10/2015   Procedure: BIOPSY;  Surgeon: Danie Binder, MD;  [facility];  Service: Endoscopy;;  gastric biopsy   CARDIAC CATHETERIZATION  05/11/2003   @ [facility] by dr t. Claiborne Billings;  Abnormal cardiolite/   Normal coronary arteries and normal LVF,  ef  63%   CARDIOVASCULAR STRESS TEST  01/01/2014   normal lexiscan cardiolite/  no ischemia/ infarct/  normal LV function and wall motion ,  ef 81%   COLONOSCOPY WITH PROPOFOL N/A 02/26/2017   Procedure: COLONOSCOPY WITH PROPOFOL;  Surgeon: Danie Binder, MD;  [facility];  Service: Endoscopy;  Laterality: N/A;  11:00am   COLONOSCOPY WITH PROPOFOL N/A 09/22/2019   Procedure: COLONOSCOPY WITH PROPOFOL;  Surgeon: Danie Binder, MD;  [facility];  Service: Endoscopy;  Laterality: N/A;  1:15   CYSTO/   RIGHT URETEROSOCOPY LASER LITHOTRIPSY STONE EXTRACTION  10/13/2004   CYSTO/  RIGHT RETROGRADE PYELOGRAM/  PLACMENT RIGHT URETERAL STENT  01/10/2010   CYSTOSCOPY W/ URETERAL STENT PLACEMENT  Right 08/19/2015   Procedure: CYSTOSCOPY WITH RETROGRADE PYELOGRAM/URETERAL STENT PLACEMENT;  Surgeon: Franchot Gallo, MD;  [facility];  Service: Urology;  Laterality: Right;   CYSTOSCOPY W/ URETERAL STENT PLACEMENT Left 02/23/2020   Procedure: CYSTOSCOPY LEFT  RETROGRADE PYELOGRAM LEFT URETEROSCOPY URETERAL STENT PLACEMENT;  Surgeon: Franchot Gallo, MD;  Location[facility];  Service: Urology;  Laterality: Left;   CYSTOSCOPY WITH RETROGRADE PYELOGRAM, URETEROSCOPY AND STENT PLACEMENT Left 10/21/2014   Procedure: CYSTOSCOPY  WITH RETROGRADE PYELOGRAM, URETEROSCOPY AND STENT PLACEMENT;  Surgeon: Franchot Gallo, MD;  Location[facility];  Service: Urology;  Laterality: Left;   CYSTOSCOPY WITH RETROGRADE PYELOGRAM, URETEROSCOPY AND STENT PLACEMENT Right 11/22/2015   Procedure: CYSTOSCOPY, RIGHT URETERAL STENT REMOVAL; RIGHT RETROGRADE PYELOGRAM, RIGHT URETEROSCOPY WITH BALLOON DILATION; RIGHT URETERAL STENT PLACEMENT;  Surgeon: Franchot Gallo, MD;  [facility];  Service: Urology;  Laterality: Right;   CYSTOSCOPY WITH RETROGRADE PYELOGRAM, URETEROSCOPY AND STENT PLACEMENT Left 10/18/2020   Procedure: CYSTOSCOPY WITH LEFT RETROGRADE PYELOGRAM, LEFT URETEROSCOPY  WITH LASER AND LEFT URETERAL STENT PLACEMENT;  Surgeon: Franchot Gallo, MD;  [facility];  Service: Urology;  Laterality: Left;   CYSTOSCOPY WITH RETROGRADE PYELOGRAM, URETEROSCOPY AND STENT PLACEMENT Left 10/26/2021   Procedure: CYSTOSCOPY WITH RETROGRADE PYELOGRAM, URETEROSCOPY, STONE EXTRACTION  AND STENT PLACEMENT;  Surgeon: Franchot Gallo, MD;  [facility];  Service: Urology;  Laterality: Left;   CYSTOSCOPY/RETROGRADE/URETEROSCOPY/STONE EXTRACTION WITH BASKET Bilateral 08/02/2015   Procedure: CYSTOSCOPY; BILATERAL RETROGRADE PYELOGRAMS; BILATERAL URETEROSCOPY, STONE EXTRACTION WITH BASKET;  Surgeon: Franchot Gallo, MD;  [facility];  Service: Urology;  Laterality:  Bilateral;   CYSTOSCOPY/RETROGRADE/URETEROSCOPY/STONE EXTRACTION WITH BASKET Right 06/28/2015   Procedure: CYSTOSCOPY/RETROGRADE/URETEROSCOPY/STONE EXTRACTION WITH BASKET, RIGHT URETERAL DOUBLE J STENT PLACEMENT;  Surgeon: Franchot Gallo, MD;  [facility];  Service: Urology;  Laterality: Right;   ESOPHAGOGASTRODUODENOSCOPY (EGD) WITH PROPOFOL N/A 03/17/2013   Procedure: ESOPHAGOGASTRODUODENOSCOPY (EGD) WITH PROPOFOL;  Surgeon: Danie Binder, MD;  [facility];  Service: Endoscopy;  Laterality: N/A;   ESOPHAGOGASTRODUODENOSCOPY (EGD) WITH PROPOFOL N/A 06/10/2015   Distal gastritis, distal esophageal stricture s/p dilation   ESOPHAGOGASTRODUODENOSCOPY (EGD) WITH PROPOFOL N/A 02/26/2017   Procedure: ESOPHAGOGASTRODUODENOSCOPY (EGD) WITH PROPOFOL;  Surgeon: Danie Binder, MD;  [facility];  Service: Endoscopy;  Laterality: N/A;   FLEXIBLE SIGMOIDOSCOPY  09/11/2011   WUJ:[id number] Internal hemorrhoids   HOLMIUM LASER APPLICATION Left 29/56/2130   Procedure: HOLMIUM LASER APPLICATION;  Surgeon: Franchot Gallo, MD;  Location: [facility];  Service: Urology;  Laterality: Left;   HOLMIUM LASER APPLICATION Right 86/57/8469   Procedure: HOLMIUM LASER APPLICATION;  Surgeon: Franchot Gallo, MD;  [facility];  Service: Urology;  Laterality: Right;   HOLMIUM LASER APPLICATION  62/95/2841   Procedure: HOLMIUM LASER APPLICATION;  Surgeon: Franchot Gallo, MD;  [facility];  Service: Urology;;   HOLMIUM LASER APPLICATION Left 32/44/0102   Procedure: HOLMIUM LASER APPLICATION;  Surgeon: Franchot Gallo, MD;  [facility];  Service: Urology;  Laterality: Left;   MASS EXCISION Left 03/04/2015   Procedure: EXCISION OF SOFT TISSUE NEOPLASM LEFT ARM;  Surgeon: Aviva Signs, MD;  Location: [facility];  Service: General;  Laterality: Left;   PERCUTANEOUS NEPHROSTOLITHOTOMY Bilateral 05/2010   '@[facility]'$    POLYPECTOMY N/A 03/17/2013   Procedure: GASTRIC  POLYPECTOMY;  Surgeon: Danie Binder, MD;  [facility];  Service: Endoscopy;  Laterality: N/A;   POLYPECTOMY  02/26/2017   Procedure: POLYPECTOMY;  Surgeon: Danie Binder, MD;  [facility];  Service: Endoscopy;;  polyp at cecum, ascending colon polyps x3, hepatic flexure polyps x8, transverse colon polyps x8    POLYPECTOMY  09/22/2019   Procedure: POLYPECTOMY;  Surgeon: Danie Binder, MD;  [facility];  Service: Endoscopy;;   REMOVAL RIGHT THIGH CYST  2006   SAVORY DILATION N/A 03/17/2013   Procedure: SAVORY DILATION;  Surgeon: Danie Binder, MD;  [facility];  Service: Endoscopy;  Laterality: N/A;  #  12.8, 14, 15, 16 dilators used   SAVORY DILATION N/A 06/10/2015   Procedure: SAVORY DILATION;  Surgeon: Danie Binder, MD;  [facility];  Service: Endoscopy;  Laterality: N/A;   VAGINAL HYSTERECTOMY  47's   Family History  Problem Relation Age of Onset   Hypertension Mother    Diabetes Mother    Heart failure Mother    Dementia Mother    Emphysema Father    Hypertension Father    Diabetes Brother    GER disease Brother    Hypertension Sister    Hypertension Sister    Cancer Other        family history    Diabetes Other        family history    Heart defect Other        famiily history    Arthritis Other        family history    Anesthesia problems Neg Hx    Hypotension Neg Hx    Malignant hyperthermia Neg Hx    Pseudochol deficiency Neg Hx    Colon cancer Neg Hx    Social History   Socioeconomic History   Marital status: Divorced    Spouse name: Not on file   Number of children: 2   Years of education: 12   Highest education level: 12th grade  Occupational History   Occupation: retired   Tobacco Use   Smoking status: Former    Packs/day: 1.00    Years: 20.00    Total pack years: 20.00    Types: Cigarettes    Start date: 09/21/1974    Quit date: 09/20/1988    Years since quitting: 33.8    Passive exposure: Past    Smokeless tobacco: Never  Vaping Use   Vaping Use: Never used  Substance and Sexual Activity   Alcohol use: No    Alcohol/week: 0.0 standard drinks of alcohol   Drug use: No   Sexual activity: Not Currently    Partners: Male    Birth control/protection: Surgical  Other Topics Concern   Not on file  Social History Narrative   Patient is right handed   Patient drinks some caffeine daily.   Social Determinants of Health   Financial Resource Strain: Low Risk  (04/02/2022)   Overall Financial Resource Strain (CARDIA)    Difficulty of Paying Living Expenses: Not hard at all  Food Insecurity: No Food Insecurity (04/02/2022)   Hunger Vital Sign    Worried About Running Out of Food in the Last Year: Never true    Ran Out of Food in the Last Year: Never true  Transportation Needs: No Transportation Needs (04/02/2022)   PRAPARE - Hydrologist (Medical): No    Lack of Transportation (Non-Medical): No  Physical Activity: Inactive (04/02/2022)   Exercise Vital Sign    Days of Exercise per Week: 0 days    Minutes of Exercise per Session: 0 min  Stress: No Stress Concern Present (04/02/2022)   [place]    Feeling of Stress : Not at all  Social Connections: Moderately Isolated (04/02/2022)   Social Connection and Isolation Panel [NHANES]    Frequency of Communication with Friends and Family: More than three times a week    Frequency of Social Gatherings with Friends and Family: More than three times a week    Attends Religious Services: More than 4 times per year  Active Member of Clubs or Organizations: No    Attends Archivist Meetings: Never    Marital Status: Divorced    Tobacco Counseling Counseling given: Not Answered   Clinical Intake:  Diabetic? Yes   Activities of Daily Living    04/02/2022    3:31 PM 10/26/2021    1:20 PM  In your present state of health,  do you have any difficulty performing the following activities:  Hearing? 0 0  Vision? 0 0  Difficulty concentrating or making decisions? 0 0  Walking or climbing stairs? 1 1  Comment Ankle Pain   Dressing or bathing? 1 0  Comment Ankle Pain   Doing errands, shopping? 0   Preparing Food and eating ? N   Using the Toilet? N   In the past six months, have you accidently leaked urine? N   Do you have problems with loss of bowel control? N   Managing your Medications? N   Managing your Finances? N   Housekeeping or managing your Housekeeping? N     Patient Care Team: Derek Jack, MD as PCP - General (Hematology) Harl Bowie Alphonse Guild, MD as PCP - Cardiology (Cardiology) Danie Binder, MD (Inactive) (Gastroenterology) Irine Seal, MD as Attending Physician (Urology) Kathrynn Ducking, MD (Inactive) (Neurology) Franchot Gallo, MD as Consulting Physician (Urology) Rutherford Guys, MD as Consulting Physician (Ophthalmology)  Indicate any recent Medical Services you may have received from other than [facility] providers in the past year (date may be approximate).     Assessment:   This is a routine wellness examination for [place].  Hearing/Vision screen No results found.  Dietary issues and exercise activities discussed:     Goals Addressed   None   Depression Screen    04/02/2022    3:27 PM 03/20/2022   10:47 AM 07/28/2021   11:08 AM 07/06/2021    2:14 PM 04/17/2021   10:33 AM 04/17/2021   10:29 AM 02/17/2021    2:56 PM  PHQ 2/9 Scores  PHQ - 2 Score 0 0 0 0 0 0 0    Fall Risk    04/02/2022    3:31 PM 04/02/2022    3:19 PM 03/20/2022   10:47 AM 07/28/2021   11:08 AM 07/06/2021    2:13 PM  Fall Risk   Falls in the past year? 0 0 0 0 1  Number falls in past yr: 0 0 0 0 0  Injury with Fall? 0 0 0 0 0  Risk for fall due to : No Fall Risks No Fall Risks No Fall Risks No Fall Risks Impaired balance/gait  Follow up Falls prevention discussed;Education provided;Falls  evaluation completed   Falls evaluation completed     FALL RISK PREVENTION PERTAINING TO THE HOME:  Any stairs in or around the home? Yes  If so, are there any without handrails? Yes  Home free of loose throw rugs in walkways, pet beds, electrical cords, etc? Yes  Adequate lighting in your home to reduce risk of falls? Yes   ASSISTIVE DEVICES UTILIZED TO PREVENT FALLS:  Life alert? No  Use of a cane, walker or w/c? Yes  Grab bars in the bathroom? No  Shower chair or bench in shower? Yes  Elevated toilet seat or a handicapped toilet? No    Cognitive Function:    04/17/2021   10:34 AM  MMSE - Mini Mental State Exam  Not completed: Unable to complete        04/17/2021  10:35 AM 04/01/2020   10:27 AM 04/01/2019    9:09 AM 03/24/2018    3:10 PM 03/20/2017    2:40 PM  6CIT Screen  What Year? 0 points 0 points 0 points 0 points 0 points  What month? 0 points 0 points 0 points 0 points 0 points  What time? 3 points 0 points 0 points 0 points 0 points  Count back from 20 0 points 0 points 0 points 0 points 0 points  Months in reverse 0 points 2 points 0 points 0 points 0 points  Repeat phrase 0 points 0 points 0 points 2 points 0 points  Total Score 3 points 2 points 0 points 2 points 0 points    Immunizations Immunization History  Administered Date(s) Administered   Fluad Quad(high Dose 65+) 03/21/2020, 02/17/2021, 03/06/2022   Influenza Split 03/17/2015   Influenza Whole 04/15/2007, 03/17/2009   Influenza, High Dose Seasonal PF 02/02/2019   Influenza,inj,Quad PF,6+ Mos 02/04/2014, 02/12/2017, 02/17/2018   Influenza-Unspecified 02/02/2019   Moderna SARS-COV2 Booster Vaccination 03/06/2022   Moderna Sars-Covid-2 Vaccination 07/22/2019, 08/19/2019, 04/23/2020, 10/13/2020   Pneumococcal Conjugate-13 12/21/2013   Pneumococcal Polysaccharide-23 01/19/2004, 05/23/2010, 02/02/2019   Td 06/07/2003   Tdap 06/14/2011   Zoster Recombinat (Shingrix) 04/01/2020, 05/31/2020    Zoster, Live 02/02/2008    TDAP status: Due, Education has been provided regarding the importance of this vaccine. Advised may receive this vaccine at local pharmacy or Health Dept. Aware to provide a copy of the vaccination record if obtained from local pharmacy or Health Dept. Verbalized acceptance and understanding.  Flu Vaccine status: Up to date  Pneumococcal vaccine status: Up to date  Covid-19 vaccine status: Declined, Education has been provided regarding the importance of this vaccine but patient still declined. Advised may receive this vaccine at local pharmacy or Health Dept.or vaccine clinic. Aware to provide a copy of the vaccination record if obtained from local pharmacy or Health Dept. Verbalized acceptance and understanding.  Qualifies for Shingles Vaccine? No   Zostavax completed No   Shingrix Completed?: Yes  Screening Tests Health Maintenance  Topic Date Due   DTaP/Tdap/Td (3 - Td or Tdap) 06/13/2021   COLONOSCOPY (Pts 45-50yr Insurance coverage will need to be confirmed)  09/21/2021   COVID-19 Vaccine (5 - 2023-24 season) 05/01/2022   OPHTHALMOLOGY EXAM  08/16/2022   HEMOGLOBIN A1C  09/20/2022   Diabetic kidney evaluation - Urine ACR  03/21/2023   FOOT EXAM  03/21/2023   Diabetic kidney evaluation - eGFR measurement  06/14/2023   Medicare Annual Wellness (AWV)  07/07/2023   Pneumonia Vaccine 77 Years old  Completed   INFLUENZA VACCINE  Completed   DEXA SCAN  Completed   Hepatitis C Screening  Completed   Zoster Vaccines- Shingrix  Completed   HPV VACCINES  Aged Out    Health Maintenance  Health Maintenance Due  Topic Date Due   DTaP/Tdap/Td (3 - Td or Tdap) 06/13/2021   COLONOSCOPY (Pts 45-4108yrInsurance coverage will need to be confirmed)  09/21/2021   COVID-19 Vaccine (5 - 2023-24 season) 05/01/2022    Colorectal cancer screening: Type of screening: Colonoscopy. Completed 2021. Repeat every 2 years  Mammogram status: Completed 2024. Repeat every  year  Bone Density status: Completed 2023. Results reflect: Bone density results: OSTEOPENIA. Repeat every 2 years.  Lung Cancer Screening: (Low Dose CT Chest recommended if Age 78-80ears, 30 pack-year currently smoking OR have quit w/in 15years.) does not qualify.   Lung Cancer Screening Referral: NA  Additional Screening:  Hepatitis C Screening: does not qualify; Completed   Vision Screening: Recommended annual ophthalmology exams for early detection of glaucoma and other disorders of the eye. Is the patient up to date with their annual eye exam?  Yes  Who is the provider or what is the name of the office in which the patient attends annual eye exams? [facility]. If pt is not established with a provider, would they like to be referred to a provider to establish care? No .   Dental Screening: Recommended annual dental exams for proper oral hygiene  Community Resource Referral / Chronic Care Management: CRR required this visit?  No   CCM required this visit?  No      Plan:     I have personally reviewed and noted the following in the patient's chart:   Medical and social history Use of alcohol, tobacco or illicit drugs  Current medications and supplements including opioid prescriptions. Patient is currently taking opioid prescriptions. Information provided to patient regarding non-opioid alternatives. Patient advised to discuss non-opioid treatment plan with their provider. Functional ability and status Nutritional status Physical activity Advanced directives List of other physicians Hospitalizations, surgeries, and ER visits in previous 12 months Vitals Screenings to include cognitive, depression, and falls Referrals and appointments  In addition, I have reviewed and discussed with patient certain preventive protocols, quality metrics, and best practice recommendations. A written personalized care plan for preventive services as well as general preventive health  recommendations were provided to patient.     Smitty Knudsen, CMA   07/06/2022

## 2022-07-06 NOTE — Patient Instructions (Signed)
  [name] , Thank you for taking time to come for your Medicare Wellness Visit. I appreciate your ongoing commitment to your health goals. Please review the following plan we discussed and let me know if I can assist you in the future.   These are the goals we discussed:  Goals      Activity and Exercise Increased     Wants to do more around the house and get it organized and be more active.      Blood Pressure < 140/90     HEMOGLOBIN A1C < 7.0     LDL CALC < 100     Patient Stated     Per pt. 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)        This is a list of the screening recommended for you and due dates:  Health Maintenance  Topic Date Due   DTaP/Tdap/Td vaccine (3 - Td or Tdap) 06/13/2021   Colon Cancer Screening  09/21/2021   COVID-19 Vaccine (5 - 2023-24 season) 05/01/2022   Eye exam for diabetics  08/16/2022   Hemoglobin A1C  09/20/2022   Yearly kidney health urinalysis for diabetes  03/21/2023   Complete foot exam   03/21/2023   Yearly kidney function blood test for diabetes  06/14/2023   Medicare Annual Wellness Visit  07/07/2023   Pneumonia Vaccine  Completed   Flu Shot  Completed   DEXA scan (bone density measurement)  Completed   Hepatitis C Screening: USPSTF Recommendation to screen - Ages 2-79 yo.  Completed   Zoster (Shingles) Vaccine  Completed   HPV Vaccine  Aged Out

## 2022-07-10 ENCOUNTER — Encounter: Payer: Self-pay | Admitting: Family Medicine

## 2022-07-10 ENCOUNTER — Ambulatory Visit (INDEPENDENT_AMBULATORY_CARE_PROVIDER_SITE_OTHER): Payer: 59 | Admitting: Family Medicine

## 2022-07-10 VITALS — BP 122/74 | HR 81 | Ht 66.0 in | Wt 183.0 lb

## 2022-07-10 DIAGNOSIS — M25511 Pain in right shoulder: Secondary | ICD-10-CM | POA: Diagnosis not present

## 2022-07-10 DIAGNOSIS — G8929 Other chronic pain: Secondary | ICD-10-CM

## 2022-07-10 DIAGNOSIS — Z0001 Encounter for general adult medical examination with abnormal findings: Secondary | ICD-10-CM

## 2022-07-10 DIAGNOSIS — E1121 Type 2 diabetes mellitus with diabetic nephropathy: Secondary | ICD-10-CM | POA: Diagnosis not present

## 2022-07-10 DIAGNOSIS — M25561 Pain in right knee: Secondary | ICD-10-CM | POA: Diagnosis not present

## 2022-07-10 DIAGNOSIS — M67432 Ganglion, left wrist: Secondary | ICD-10-CM

## 2022-07-10 MED ORDER — FREESTYLE LIBRE 2 SENSOR MISC: 12 refills | Status: DC

## 2022-07-10 MED ORDER — FREESTYLE LIBRE 2 READER DEVI: 12 refills | Status: DC

## 2022-07-10 NOTE — Progress Notes (Signed)
Cheryl Abbott     MRN: [id number]      DOB: 14-Apr-1946  HPI: Patient is in for annual physical exam. 2 week h/o painful knot just proximal to to left wrist Uncontrolled severe left soulder pain with marked limitation in mobility and right knee painful and swollen, feels unstable Recent labs,  are reviewed. Immunization is reviewed , and  updated if needed.   PE: BP 122/74 (BP Location: Right Arm, Patient Position: Sitting, Cuff Size: Large)   Pulse 81   Ht '5\' 6"'$  (1.676 m)   Wt 183 lb 0.6 oz (83 kg)   SpO2 90%   BMI 29.54 kg/m   Pleasant  female, alert and oriented x 3, in no cardio-pulmonary distress. Afebrile. HEENT No facial trauma or asymetry. Sinuses non tender.  Extra occullar muscles intact.. External ears normal, . Neck: supple, no adenopathy,JVD or thyromegaly.No bruits.  Chest: Clear to ascultation bilaterally.No crackles or wheezes. Non tender to palpation   Cardiovascular system; Heart sounds normal,  S1 and  S2 ,no S3.  No murmur, or thrill. Peripheral pulses normal.  Abdomen: Soft, non tender, Uterus normal size, no adnexal masses, no cervical motion or adnexal tenderness.   Musculoskeletal exam: Decreased though adequate ROM of spine, hips ,markedly reduced in right  shoulder and normal In  knees.  deformity ,swelling and  crepitus noted in right knee Nodule at left wrist, tender No muscle wasting or atrophy.   Neurologic: Cranial nerves 2 to 12 intact. Power, tone ,sensation and reflexes normal throughout. No disturbance in gait. No tremor.  Skin: Intact, no ulceration, erythema , scaling or rash noted. Pigmentation normal throughout  Psych; Normal mood and affect. Judgement and concentration normal   Assessment & Plan:  Encounter for Medicare annual examination with abnormal findings Annual exam as documented. Counseling done  re healthy lifestyle involving commitment to 150 minutes exercise per week, heart healthy diet, and  attaining healthy weight.The importance of adequate sleep also discussed. . Immunization and cancer screening needs are specifically addressed at this visit.   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   The need to take medication as prescribed, test blood sugar as directed, and to call between visits if there is a concern that blood sugar is uncontrolled is also discussed.   Cheryl Abbott is reminded of the importance of daily foot exam, annual eye examination, and good blood sugar, blood pressure and cholesterol control. No change in management, controlled      Latest Ref Rng & Units 07/10/2022    3:12 PM 06/13/2022   11:17 AM 03/21/2022    9:41 AM 03/20/2022   11:45 AM 03/06/2022   11:04 AM  Diabetic Labs  HbA1c 4.8 - 5.6 % 6.3   6.3     Micro/Creat Ratio 0 - 29 mg/g creat    48    Chol 100 - 199 mg/dL   150     HDL >39 mg/dL   41     Calc LDL 0 - 99 mg/dL   89     Triglycerides 0 - 149 mg/dL   111     Creatinine 0.57 - 1.00 mg/dL 1.63  1.50  1.34   1.69       07/10/2022  2:08 PM 07/06/2022   10:22 AM 05/08/2022    3:29 PM 03/28/2022    9:18 AM 03/20/2022   10:44 AM 03/14/2022    9:15 AM 01/26/2022   10:54 AM  BP/Weight  Systolic BP 852  778 242 353 614 431  Diastolic BP 74  69 77 78 73 70  Wt. (Lbs) 183.04 187 193 189 187 186.07 187.6  BMI 29.54 kg/m2 30.18 kg/m2 30.68 kg/m2 30.51 kg/m2 30.18 kg/m2 30.03 kg/m2 30.28 kg/m2      Latest Ref Rng & Units 08/15/2021   12:00 AM 03/08/2021   12:00 AM  Foot/eye exam completion dates  Eye Exam No Retinopathy No Retinopathy     No Retinopathy         This result is from an external source.        Chronic right shoulder pain Worsening with limitation in movement, Ortho to eval  Ganglion cyst of dorsum of left  wrist Ortho to eval and manage  Knee pain, right Increased pain,  swelling and limitation in mobility, no recent trauma, refer Ortho

## 2022-07-10 NOTE — Patient Instructions (Addendum)
Follow-up in 4-1/2 to 5 months, call if you need me sooner.  You are referred to Dr. Aline Brochure me ongoing right  shoulder pain as well as new left wrist nodule.  I am also referring you for right knee pain and instability.  You will be referred to the nephrologist Dr. Marylene Buerger for follow-up of chronic kidney disease.  Nurse to prescribe freestyle libre for blood sugar testing.  HBA1C, cmp and EGFR today  No prescription from me for Golo diet, I recommend following diet recommended by diabetic educator Thanks for choosing [facility], we consider it a privelige to serve you.

## 2022-07-11 ENCOUNTER — Other Ambulatory Visit: Payer: Self-pay | Admitting: Orthopedic Surgery

## 2022-07-11 ENCOUNTER — Other Ambulatory Visit: Payer: Self-pay | Admitting: Family Medicine

## 2022-07-11 ENCOUNTER — Telehealth: Payer: Self-pay | Admitting: Gastroenterology

## 2022-07-11 DIAGNOSIS — G8929 Other chronic pain: Secondary | ICD-10-CM

## 2022-07-11 LAB — CMP14+EGFR
ALT: 10 IU/L (ref 0–32)
AST: 14 IU/L (ref 0–40)
Albumin/Globulin Ratio: 1.3 (ref 1.2–2.2)
Albumin: 4.8 g/dL (ref 3.8–4.8)
Alkaline Phosphatase: 92 IU/L (ref 44–121)
BUN/Creatinine Ratio: 12 (ref 12–28)
BUN: 19 mg/dL (ref 8–27)
Bilirubin Total: <0.2 mg/dL (ref 0.0–1.2)
CO2: 23 mmol/L (ref 20–29)
Calcium: 10.6 mg/dL — ABNORMAL HIGH (ref 8.7–10.3)
Chloride: 96 mmol/L (ref 96–106)
Creatinine, Ser: 1.63 mg/dL — ABNORMAL HIGH (ref 0.57–1.00)
Globulin, Total: 3.6 g/dL (ref 1.5–4.5)
Glucose: 93 mg/dL (ref 70–99)
Potassium: 4.6 mmol/L (ref 3.5–5.2)
Sodium: 135 mmol/L (ref 134–144)
Total Protein: 8.4 g/dL (ref 6.0–8.5)
eGFR: 32 mL/min/{1.73_m2} — ABNORMAL LOW (ref >59)

## 2022-07-11 LAB — HEMOGLOBIN A1C
Est. average glucose Bld gHb Est-mCnc: 134 mg/dL
Hgb A1c MFr Bld: 6.3 % — ABNORMAL HIGH (ref 4.8–5.6)

## 2022-07-11 NOTE — Telephone Encounter (Signed)
Tried to call patient, twice someone picked up but would not speak, but hung up.   Please let patient know that I spoke with Dr. Abbey Chatters regarding having another colonoscopy. Technically she has "aged out" of surveillance colonoscopies. He is ok with her holding off on further colonoscopies BUT she has a history of numerous colon polyps on the last two colonoscopies and we cannot guarantee that she does not make more polyps that with time could become cancer.   We could offer her a colonoscopy if she desires. She is not due until 09/2022 (3 year follow up).   If she desires colonoscopy, schedule for march. ASA 3 Hold Trulicity one week before Day of prep: tradjenta 1/2 tablet Am of TCS: hold tradjenta Use trilyte

## 2022-07-11 NOTE — Telephone Encounter (Signed)
Pt was made aware and verbalized understanding. Pt will call if she decides to have one done.

## 2022-07-12 ENCOUNTER — Encounter (HOSPITAL_COMMUNITY): Payer: Self-pay | Admitting: Hematology

## 2022-07-12 ENCOUNTER — Telehealth: Payer: Self-pay | Admitting: Orthopaedic Surgery

## 2022-07-12 DIAGNOSIS — M25512 Pain in left shoulder: Secondary | ICD-10-CM

## 2022-07-13 ENCOUNTER — Encounter: Payer: Self-pay | Admitting: Family Medicine

## 2022-07-13 DIAGNOSIS — Z0001 Encounter for general adult medical examination with abnormal findings: Secondary | ICD-10-CM | POA: Insufficient documentation

## 2022-07-13 DIAGNOSIS — M67432 Ganglion, left wrist: Secondary | ICD-10-CM | POA: Insufficient documentation

## 2022-07-13 DIAGNOSIS — M25561 Pain in right knee: Secondary | ICD-10-CM | POA: Insufficient documentation

## 2022-07-13 IMAGING — DX DG BONE SURVEY MET
9 of 10 series · 9 of 10 positions shown · non-contrast
Comparison: 11/21/2018

CLINICAL DATA: Multiple myeloma low back pain, proximal leg pain

EXAM:
METASTATIC BONE SURVEY

[skull lat]
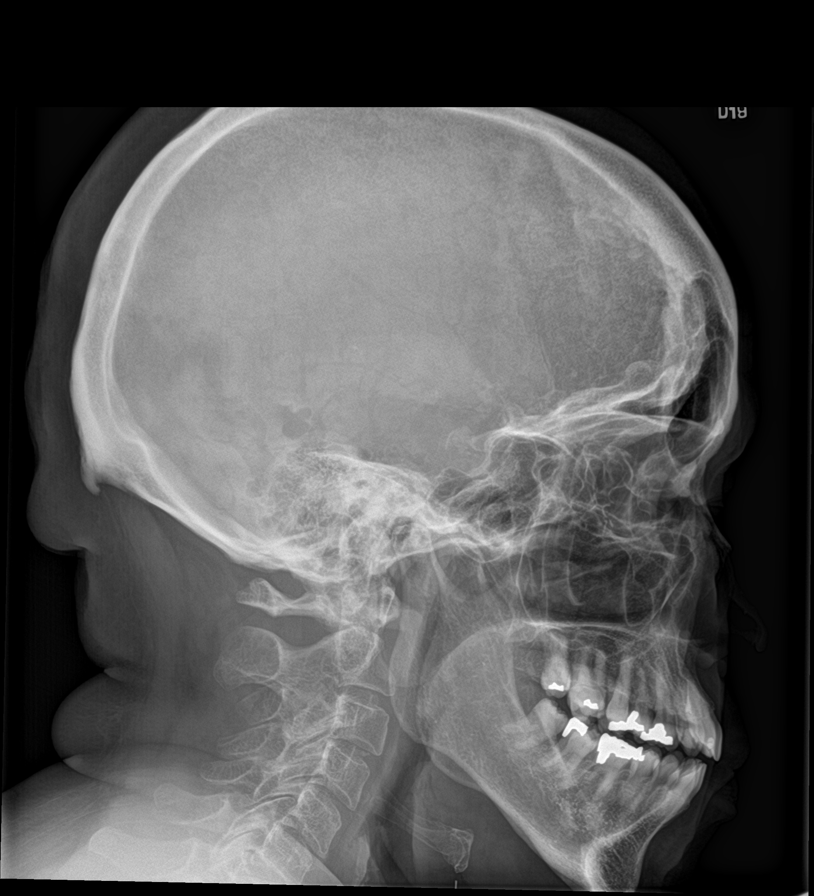

[shoulder ap (1 of 2)]
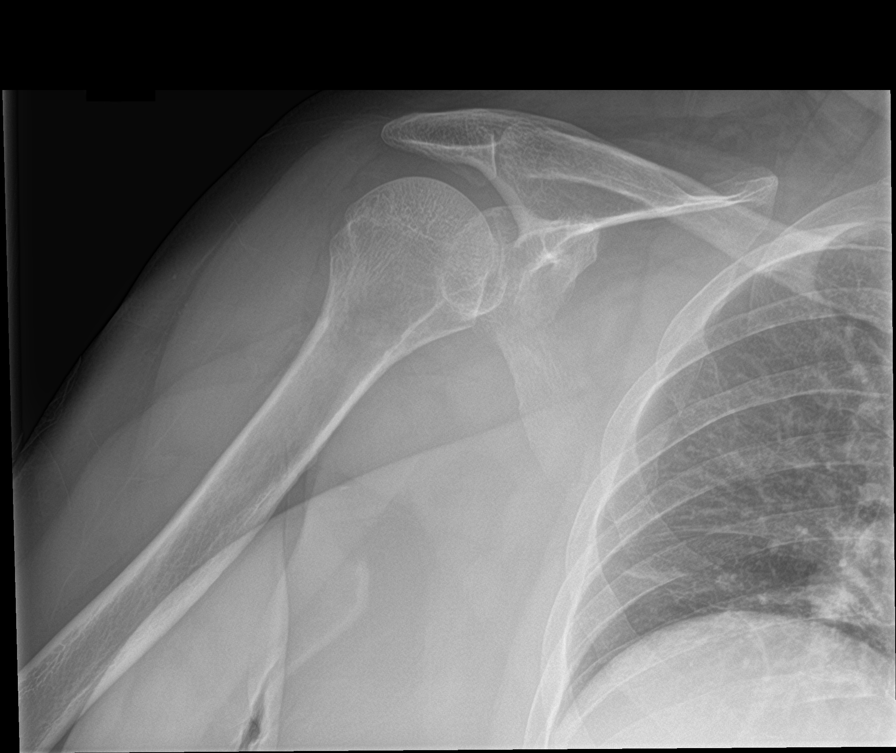

[shoulder ap (2 of 2)]
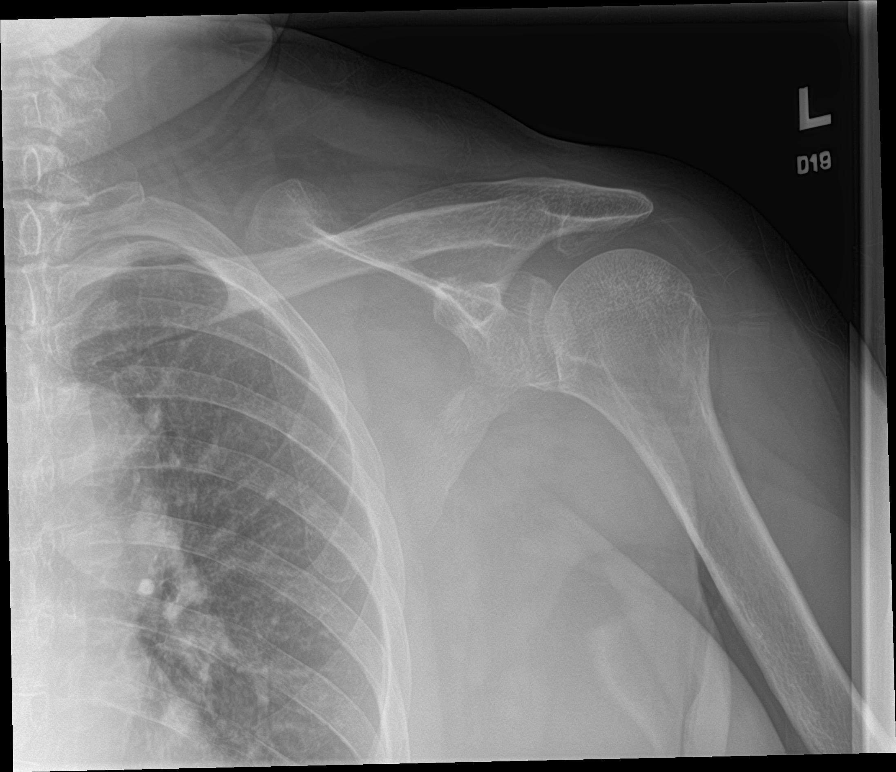

[humerus ap (1 of 2)]
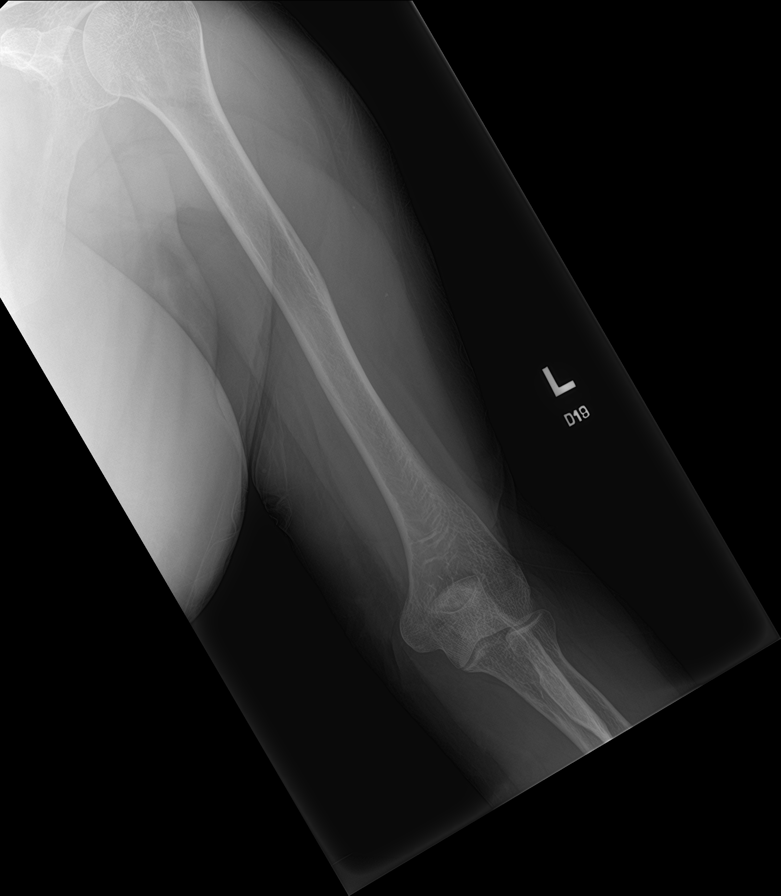

[humerus ap (2 of 2)]
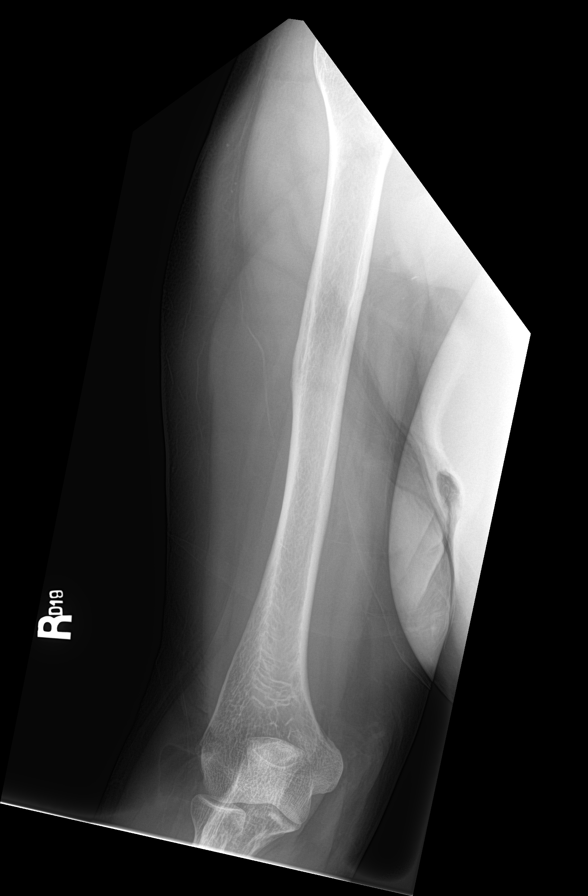

[forearm ap (1 of 2)]
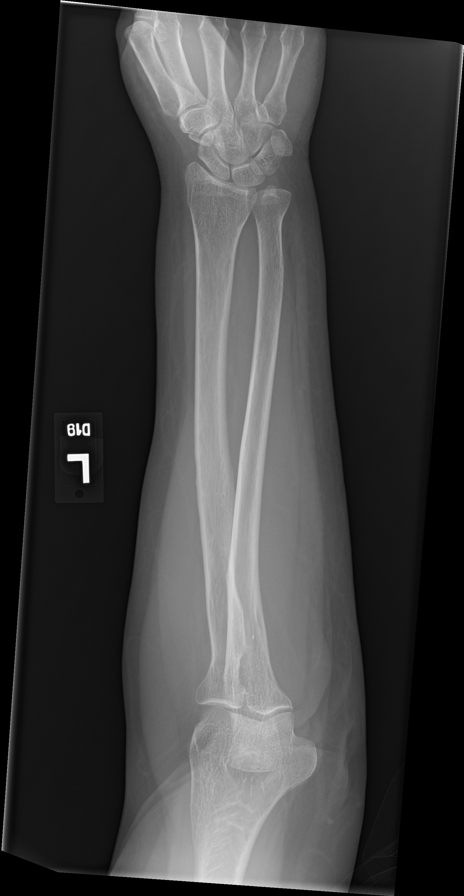

[forearm ap (2 of 2)]
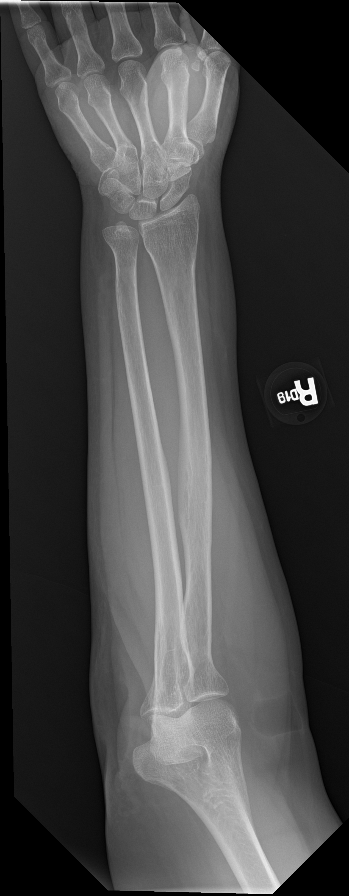

[c-spine ap]
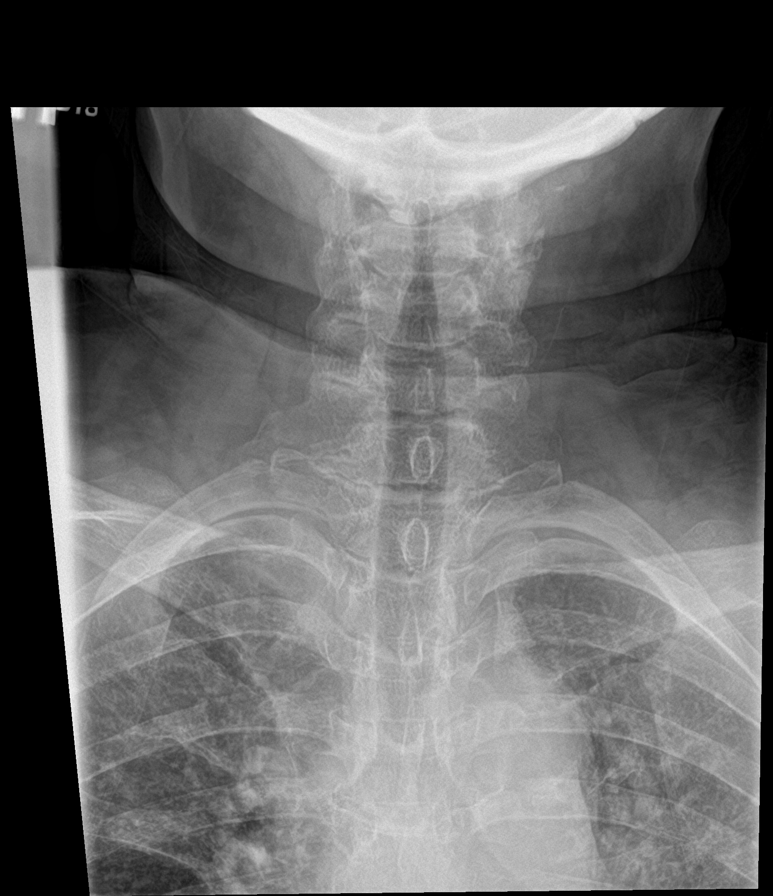

[c-spine lat]
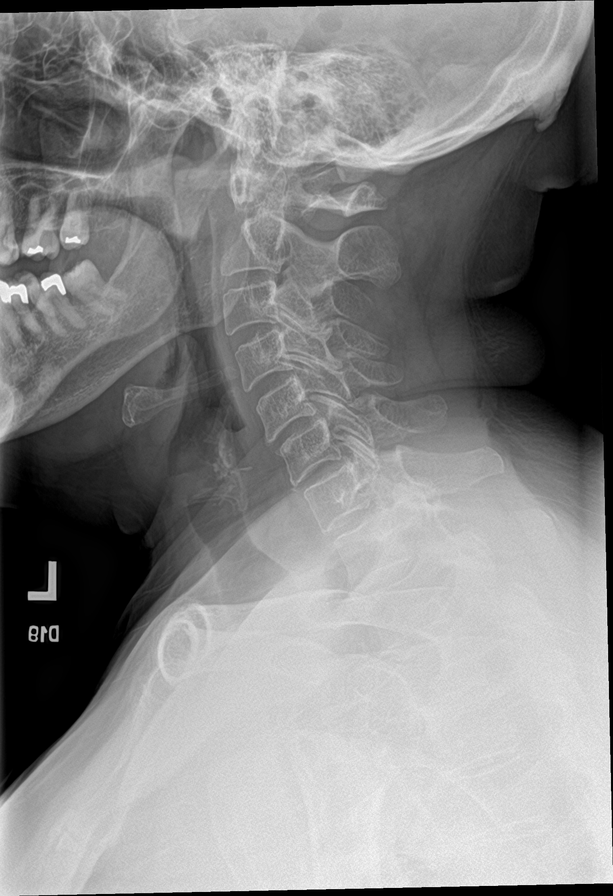

[9 of 10 positions shown; findings below may reference images not displayed]

FINDINGS: Minimal left basilar atelectasis. Lungs are otherwise clear. Cardiac
size within normal limits. Mild degenerative changes are again
identified within the cervical, thoracic, and lumbar spine. 10 mm
calculus overlies the expected left renal pelvis and demonstrates
slight interval increase in size since prior examination. Several
additional calcifications overlie the lower pole of the left kidney.
No focal lytic or blastic bone lesions are identified.
IMPRESSION: 1. Slight interval increase in size of left renal calculi, as above.
2. No focal lytic or blastic bone lesions are identified.

## 2022-07-13 NOTE — Assessment & Plan Note (Signed)
Increased pain,  swelling and limitation in mobility, no recent trauma, refer Ortho

## 2022-07-13 NOTE — Assessment & Plan Note (Signed)
Worsening with limitation in movement, Ortho to eval

## 2022-07-13 NOTE — Assessment & Plan Note (Signed)
Ortho to eval and manage

## 2022-07-13 NOTE — Assessment & Plan Note (Signed)
Annual exam as documented. Counseling done  re healthy lifestyle involving commitment to 150 minutes exercise per week, heart healthy diet, and attaining healthy weight.The importance of adequate sleep also discussed.  Immunization and cancer screening needs are specifically addressed at this visit.  

## 2022-07-13 NOTE — Assessment & Plan Note (Signed)
Cheryl Abbott is reminded of the importance of commitment to daily physical activity for 30 minutes or more, as able and the need to limit carbohydrate intake to 30 to 60 grams per meal to help with blood sugar control.   The need to take medication as prescribed, test blood sugar as directed, and to call between visits if there is a concern that blood sugar is uncontrolled is also discussed.   Cheryl Abbott is reminded of the importance of daily foot exam, annual eye examination, and good blood sugar, blood pressure and cholesterol control. No change in management, controlled      Latest Ref Rng & Units 07/10/2022    3:12 PM 06/13/2022   11:17 AM 03/21/2022    9:41 AM 03/20/2022   11:45 AM 03/06/2022   11:04 AM  Diabetic Labs  HbA1c 4.8 - 5.6 % 6.3   6.3     Micro/Creat Ratio 0 - 29 mg/g creat    48    Chol 100 - 199 mg/dL   150     HDL >39 mg/dL   41     Calc LDL 0 - 99 mg/dL   89     Triglycerides 0 - 149 mg/dL   111     Creatinine 0.57 - 1.00 mg/dL 1.63  1.50  1.34   1.69       07/10/2022    2:08 PM 07/06/2022   10:22 AM 05/08/2022    3:29 PM 03/28/2022    9:18 AM 03/20/2022   10:44 AM 03/14/2022    9:15 AM 01/26/2022   10:54 AM  BP/Weight  Systolic BP 128  786 767 209 470 962  Diastolic BP 74  69 77 78 73 70  Wt. (Lbs) 183.04 187 193 189 187 186.07 187.6  BMI 29.54 kg/m2 30.18 kg/m2 30.68 kg/m2 30.51 kg/m2 30.18 kg/m2 30.03 kg/m2 30.28 kg/m2      Latest Ref Rng & Units 08/15/2021   12:00 AM 03/08/2021   12:00 AM  Foot/eye exam completion dates  Eye Exam No Retinopathy No Retinopathy     No Retinopathy         This result is from an external source.

## 2022-07-20 DIAGNOSIS — R0902 Hypoxemia: Secondary | ICD-10-CM | POA: Diagnosis not present

## 2022-08-04 IMAGING — DX DG ABDOMEN 1V
1 series · 1 of 1 positions shown · non-contrast
Comparison: 12/22/2019

CLINICAL DATA: Preop

EXAM:
ABDOMEN - 1 VIEW

[abdomen kub]
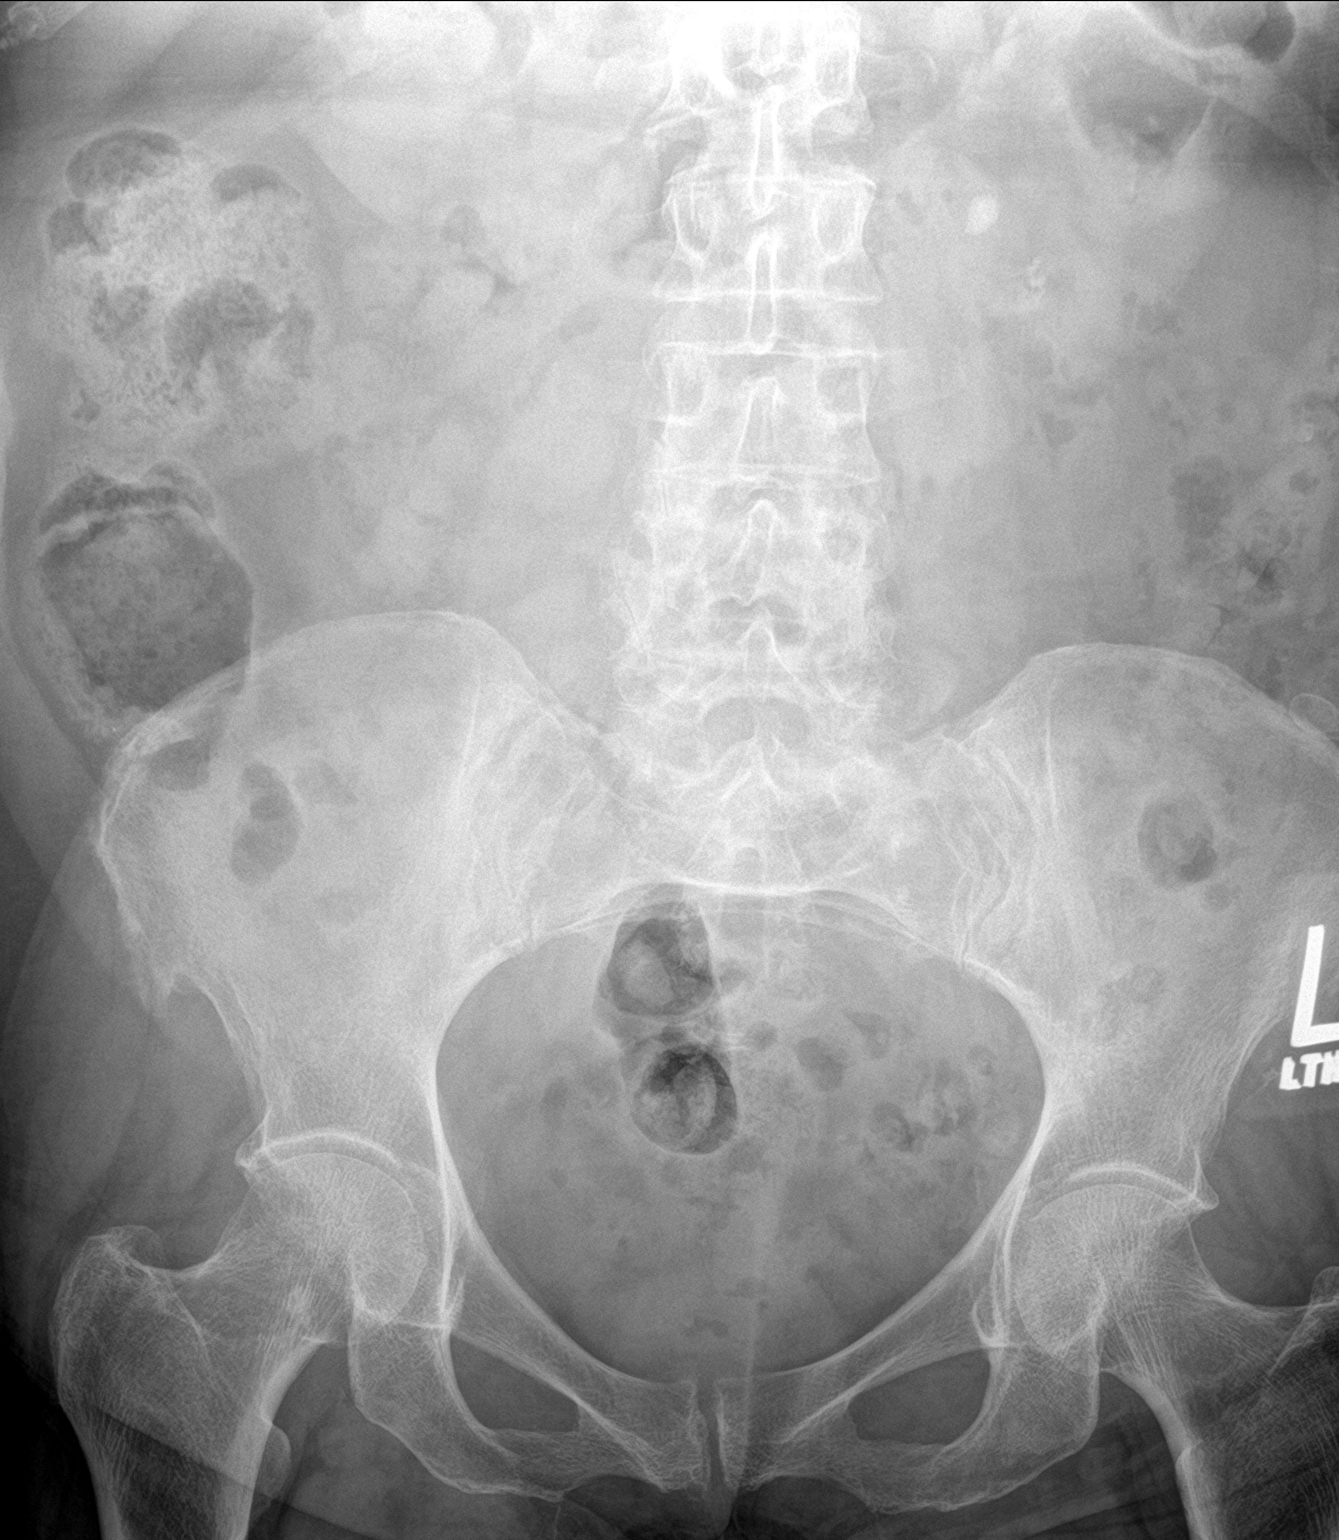

[1 of 1 positions shown; findings below may reference images not displayed]

FINDINGS: Left nephrolithiasis noted. 10 mm stone projects in the region of
the left UPJ. Nonobstructive bowel gas pattern. No acute bony
abnormality.
IMPRESSION: Left nephrolithiasis.  10 mm left UPJ stone.

## 2022-08-08 ENCOUNTER — Encounter (HOSPITAL_COMMUNITY): Payer: Self-pay | Admitting: Hematology

## 2022-08-09 ENCOUNTER — Encounter: Payer: 59 | Admitting: Orthopedic Surgery

## 2022-08-12 ENCOUNTER — Other Ambulatory Visit: Payer: Self-pay | Admitting: Orthopaedic Surgery

## 2022-08-12 ENCOUNTER — Other Ambulatory Visit: Payer: Self-pay | Admitting: Family Medicine

## 2022-08-12 DIAGNOSIS — M25512 Pain in left shoulder: Secondary | ICD-10-CM

## 2022-08-13 DIAGNOSIS — R519 Headache, unspecified: Secondary | ICD-10-CM | POA: Diagnosis not present

## 2022-08-13 DIAGNOSIS — R030 Elevated blood-pressure reading, without diagnosis of hypertension: Secondary | ICD-10-CM | POA: Diagnosis not present

## 2022-08-14 ENCOUNTER — Ambulatory Visit: Payer: 59 | Attending: Cardiology | Admitting: Cardiology

## 2022-08-14 ENCOUNTER — Encounter: Payer: Self-pay | Admitting: Cardiology

## 2022-08-14 VITALS — BP 114/82 | HR 107 | Wt 184.0 lb

## 2022-08-14 DIAGNOSIS — R0789 Other chest pain: Secondary | ICD-10-CM | POA: Diagnosis not present

## 2022-08-14 DIAGNOSIS — I10 Essential (primary) hypertension: Secondary | ICD-10-CM | POA: Diagnosis not present

## 2022-08-14 DIAGNOSIS — E782 Mixed hyperlipidemia: Secondary | ICD-10-CM | POA: Diagnosis not present

## 2022-08-14 MED ORDER — ISOSORBIDE MONONITRATE ER 30 MG PO TB24: 15.0000 mg | ORAL_TABLET | Freq: Every day | ORAL | 3 refills | Status: DC

## 2022-08-14 MED ORDER — ROSUVASTATIN CALCIUM 40 MG PO TABS: 40.0000 mg | ORAL_TABLET | Freq: Every day | ORAL | 3 refills | Status: DC

## 2022-08-14 NOTE — Progress Notes (Signed)
Clinical Summary Cheryl Abbott is a 77 y.o.female seen today for follow up of the following medical problems.    1. Chest pain/SOB - echo 02/2016 LVEF 60-65%, grade I diastoilc dysfunction - 12/2013 nuclear stress without ischemia, low risk.  07/2016 PFTs: mild restrictive disease   03/2018 LVEF 55-60%, no WMAs, indeterminate diastolic function - still some SOB at times, overall stable. No recent chest pain     01/2022 nuclear stress: no ischemia Some chest pains at times, often with anxiety - last week at home watching TV onset of chest pain. Throbbing pain midchest/epigastric pain, 9/10 in severity. Got diaphoretic, nauseous. Pain lasted 15-20 minutes - symptoms occur about 1-2 times per month. Improves with NG x2.      2. History of TIA  On ASA/Clopidogrel per neurolgoy     3. History of multiple myeloma      4. HTN - compliant with meds, controlled at other recent visits.     5. OSA Not comfortable wearing cpap -      6. HL - 05/2018 TC 172 HDL 47 TG 120 LDL 103 - 11/2018 TC 162 TG 121 HDL 47 LDL 91 - compliant with statin Jan 2023 TC 129 TG 152 HDL 41 LDL 58 03/2022 TC 150 TG 111 HDL 41 LDL 89   7.History of recurrent kidney stones Past Medical History:  Diagnosis Date   Anxiety disorder    Arthritis    Chronic back pain    Chronic pain of both shoulders    per pt told due to cervical spine pinched nerve   CKD (chronic kidney disease), stage III [facility])    nephrologist--- dr Theador Hawthorne;   due to tubular necrosis   Claustrophobia    Depression    Edema of both lower extremities    GERD (gastroesophageal reflux disease)    Headache    Hemorrhoids    w/ intermittant bleeding   History of adenomatous polyp of colon    History of kidney stones    Hx-TIA (transient ischemic attack) 02/26/2016   per pt no residual   Hydronephrosis, left    followed by urologist--- dr Diona Fanti;   persistant   Hyperlipidemia    Hypertension    Iron deficiency    followed  by [facility] w/ hx iron infusions   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   oncologist--- [facility];  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, active survillence   Lactose intolerance in adult 02/01/2016   Left ureteral calculus    Lumbar disc disease with radiculopathy    Nephrolithiasis    per CT 08-11-2021  left > right   Neuropathy, peripheral    Nocturia more than twice per night    Normocytic anemia    OSA (obstructive sleep apnea)    per study 2007;   per pt no Bipap use since 2021   Renal atrophy, bilateral    Renal cyst, right    Sciatica    Sigmoid diverticulosis    Type 2 diabetes mellitus (HCC)      Allergies  Allergen Reactions   Ace Inhibitors Cough    Patient doesn't recall   Keflex [Cephalexin] Diarrhea and Nausea And Vomiting   Nitrofurantoin Diarrhea and Nausea And Vomiting   Penicillins Hives and Other (See Comments)    Reaction:  Blisters on hands/feet  Has patient had a PCN reaction causing immediate rash, facial/tongue/throat swelling, SOB or lightheadedness with hypotension: No Has patient  had a PCN reaction causing severe rash involving mucus membranes or skin necrosis: No Has patient had a PCN reaction that required hospitalization No Has patient had a PCN reaction occurring within the last 10 years: No If all of the above answers are "NO", then may proceed with Cephalosporin use.     Current Outpatient Medications  Medication Sig Dispense Refill   acetaminophen (TYLENOL) 500 MG tablet Take 1,000 mg by mouth every 6 (six) hours as needed for mild pain.     amLODipine (NORVASC) 10 MG tablet TAKE 1 TABLET BY MOUTH EVERY DAY 90 tablet 1   aspirin EC 81 MG tablet Take 81 mg by mouth daily. Swallow whole.     Continuous Blood Gluc Receiver (FREESTYLE LIBRE 2 READER) DEVI Use to test daily. Dx: E11.21 1 each 12   Continuous Blood Gluc Sensor (FREESTYLE LIBRE 2 SENSOR) MISC Use to test daily dx: E11.21 1 each 12   diclofenac Sodium  (VOLTAREN) 1 % GEL APPLY 4 G TOPICALLY 4 (FOUR) TIMES DAILY AS NEEDED (PAIN). 400 g 5   diphenhydramine-acetaminophen (TYLENOL PM) 25-500 MG TABS tablet Take 2 tablets by mouth at bedtime.     Dulaglutide (TRULICITY) [id number] 0000000 SOPN INJECT 0.75 MG INTO THE SKIN WEEKLY 6 mL 1   Fluticasone-Umeclidin-Vilant (TRELEGY ELLIPTA) 100-62.5-25 MCG/ACT AEPB Inhale 1 puff into the lungs daily. 1 each 11   gabapentin (NEURONTIN) 300 MG capsule TAKE 1 CAPSULE BY MOUTH 3 TIMES DAILY AS NEEDED. 90 capsule 0   glucose blood (ONETOUCH ULTRA) test strip USE AS DIRECTED 100 strip 5   nitroGLYCERIN (NITROSTAT) 0.4 MG SL tablet Place 1 tablet (0.4 mg total) under the tongue every 5 (five) minutes as needed for chest pain. Up to 3 tablets, then call 911. 25 tablet 3   OneTouch Delica Lancets 99991111 MISC Once daily testing dx e11.9 100 each 5   pantoprazole (PROTONIX) 40 MG tablet TAKE 1 TABLET BY MOUTH TWICE A DAY 180 tablet 1   PARoxetine (PAXIL) 40 MG tablet TAKE 1 TABLET (40 MG TOTAL) BY MOUTH IN THE MORNING 90 tablet 1   rosuvastatin (CRESTOR) 20 MG tablet TAKE 1 TABLET BY MOUTH EVERY DAY 90 tablet 3   SYMBICORT 80-4.5 MCG/ACT inhaler INHALE 2 PUFFS INTO THE LUNGS TWICE A DAY 10.2 each 12   TRADJENTA 5 MG TABS tablet TAKE 1 TABLET BY MOUTH EVERY DAY 90 tablet 1   traMADol (ULTRAM) 50 MG tablet TAKE 1 TABLET BY MOUTH EVERY 6 HOURS AS NEEDED 60 tablet 0   triamcinolone cream (KENALOG) 0.1 % Apply 1 application topically 2 (two) times daily. (Patient taking differently: Apply 1 application  topically 2 (two) times daily as needed.) 30 g 0   UNABLE TO FIND Diabetic shoes x 1 pair, inserts x 3 pair  DX E11.9 1 each 0   VITAMIN D, CHOLECALCIFEROL, PO Take 1 tablet by mouth daily.     No current facility-administered medications for this visit.     Past Surgical History:  Procedure Laterality Date   BIOPSY N/A 03/17/2013   Procedure: GASTRIC BIOPSIES;  Surgeon: Danie Binder, MD;  [facility]: [facility];  Service:  Endoscopy;  Laterality: N/A;   BIOPSY  06/10/2015   Procedure: BIOPSY;  Surgeon: Danie Binder, MD;  [facility];  Service: Endoscopy;;  gastric biopsy   CARDIAC CATHETERIZATION  05/11/2003   @ [facility] by dr t. Claiborne Billings;  Abnormal cardiolite/   Normal coronary arteries and normal LVF,  ef  63%   CARDIOVASCULAR STRESS TEST  01/01/2014   normal lexiscan cardiolite/  no ischemia/ infarct/  normal LV function and wall motion ,  ef 81%   COLONOSCOPY WITH PROPOFOL N/A 02/26/2017   Procedure: COLONOSCOPY WITH PROPOFOL;  Surgeon: Danie Binder, MD;  [facility];  Service: Endoscopy;  Laterality: N/A;  11:00am   COLONOSCOPY WITH PROPOFOL N/A 09/22/2019   Procedure: COLONOSCOPY WITH PROPOFOL;  Surgeon: Danie Binder, MD;  [facility];  Service: Endoscopy;  Laterality: N/A;  1:15   CYSTO/   RIGHT URETEROSOCOPY LASER LITHOTRIPSY STONE EXTRACTION  10/13/2004   CYSTO/  RIGHT RETROGRADE PYELOGRAM/  PLACMENT RIGHT URETERAL STENT  01/10/2010   CYSTOSCOPY W/ URETERAL STENT PLACEMENT Right 08/19/2015   Procedure: CYSTOSCOPY WITH RETROGRADE PYELOGRAM/URETERAL STENT PLACEMENT;  Surgeon: Franchot Gallo, MD;  [facility]: [facility];  Service: Urology;  Laterality: Right;   CYSTOSCOPY W/ URETERAL STENT PLACEMENT Left 02/23/2020   Procedure: CYSTOSCOPY LEFT  RETROGRADE PYELOGRAM LEFT URETEROSCOPY URETERAL STENT PLACEMENT;  Surgeon: Franchot Gallo, MD;  [facility];  Service: Urology;  Laterality: Left;   CYSTOSCOPY WITH RETROGRADE PYELOGRAM, URETEROSCOPY AND STENT PLACEMENT Left 10/21/2014   Procedure: CYSTOSCOPY WITH RETROGRADE PYELOGRAM, URETEROSCOPY AND STENT PLACEMENT;  Surgeon: Franchot Gallo, MD;  Location: [facility];  Service: Urology;  Laterality: Left;   CYSTOSCOPY WITH RETROGRADE PYELOGRAM, URETEROSCOPY AND STENT PLACEMENT Right 11/22/2015   Procedure: CYSTOSCOPY, RIGHT URETERAL STENT REMOVAL; RIGHT RETROGRADE PYELOGRAM,  RIGHT URETEROSCOPY WITH BALLOON DILATION; RIGHT URETERAL STENT PLACEMENT;  Surgeon: Franchot Gallo, MD;  [facility];  Service: Urology;  Laterality: Right;   CYSTOSCOPY WITH RETROGRADE PYELOGRAM, URETEROSCOPY AND STENT PLACEMENT Left 10/18/2020   Procedure: CYSTOSCOPY WITH LEFT RETROGRADE PYELOGRAM, LEFT URETEROSCOPY  WITH LASER AND LEFT URETERAL STENT PLACEMENT;  Surgeon: Franchot Gallo, MD;  [facility];  Service: Urology;  Laterality: Left;   CYSTOSCOPY WITH RETROGRADE PYELOGRAM, URETEROSCOPY AND STENT PLACEMENT Left 10/26/2021   Procedure: CYSTOSCOPY WITH RETROGRADE PYELOGRAM, URETEROSCOPY, STONE EXTRACTION  AND STENT PLACEMENT;  Surgeon: Franchot Gallo, MD;  Location[facility];  Service: Urology;  Laterality: Left;   CYSTOSCOPY/RETROGRADE/URETEROSCOPY/STONE EXTRACTION WITH BASKET Bilateral 08/02/2015   Procedure: CYSTOSCOPY; BILATERAL RETROGRADE PYELOGRAMS; BILATERAL URETEROSCOPY, STONE EXTRACTION WITH BASKET;  Surgeon: Franchot Gallo, MD;  [facility];  Service: Urology;  Laterality: Bilateral;   CYSTOSCOPY/RETROGRADE/URETEROSCOPY/STONE EXTRACTION WITH BASKET Right 06/28/2015   Procedure: CYSTOSCOPY/RETROGRADE/URETEROSCOPY/STONE EXTRACTION WITH BASKET, RIGHT URETERAL DOUBLE J STENT PLACEMENT;  Surgeon: Franchot Gallo, MD;  [facility];  Service: Urology;  Laterality: Right;   ESOPHAGOGASTRODUODENOSCOPY (EGD) WITH PROPOFOL N/A 03/17/2013   Procedure: ESOPHAGOGASTRODUODENOSCOPY (EGD) WITH PROPOFOL;  Surgeon: Danie Binder, MD;  [facility];  Service: Endoscopy;  Laterality: N/A;   ESOPHAGOGASTRODUODENOSCOPY (EGD) WITH PROPOFOL N/A 06/10/2015   Distal gastritis, distal esophageal stricture s/p dilation   ESOPHAGOGASTRODUODENOSCOPY (EGD) WITH PROPOFOL N/A 02/26/2017   Procedure: ESOPHAGOGASTRODUODENOSCOPY (EGD) WITH PROPOFOL;  Surgeon: Danie Binder, MD;  [facility];  Service: Endoscopy;  Laterality: N/A;   FLEXIBLE  SIGMOIDOSCOPY  09/11/2011   [id number] Internal hemorrhoids   HOLMIUM LASER APPLICATION Left [id number]   Procedure: HOLMIUM LASER APPLICATION;  Surgeon: Franchot Gallo, MD;  Location: [facility];  Service: Urology;  Laterality: Left;   HOLMIUM LASER APPLICATION Right [id number]   Procedure: HOLMIUM LASER APPLICATION;  Surgeon: Franchot Gallo, MD;  [facility];  Service: Urology;  Laterality: Right;   HOLMIUM LASER APPLICATION  0000000   Procedure: HOLMIUM  LASER APPLICATION;  Surgeon: Franchot Gallo, MD;  [facility];  Service: Urology;;   HOLMIUM LASER APPLICATION Left [id number]   Procedure: HOLMIUM LASER APPLICATION;  Surgeon: Franchot Gallo, MD;  [facility];  Service: Urology;  Laterality: Left;   MASS EXCISION Left 03/04/2015   Procedure: EXCISION OF SOFT TISSUE NEOPLASM LEFT ARM;  Surgeon: Aviva Signs, MD;  Location: [facility];  Service: General;  Laterality: Left;   PERCUTANEOUS NEPHROSTOLITHOTOMY Bilateral 05/2010   '@[facility]'$    POLYPECTOMY N/A 03/17/2013   Procedure: GASTRIC POLYPECTOMY;  Surgeon: Danie Binder, MD;  [facility];  Service: Endoscopy;  Laterality: N/A;   POLYPECTOMY  02/26/2017   Procedure: POLYPECTOMY;  Surgeon: Danie Binder, MD;  [facility];  Service: Endoscopy;;  polyp at cecum, ascending colon polyps x3, hepatic flexure polyps x8, transverse colon polyps x8    POLYPECTOMY  09/22/2019   Procedure: POLYPECTOMY;  Surgeon: Danie Binder, MD;  [facility];  Service: Endoscopy;;   REMOVAL RIGHT THIGH CYST  2006   SAVORY DILATION N/A 03/17/2013   Procedure: SAVORY DILATION;  Surgeon: Danie Binder, MD;  [facility];  Service: Endoscopy;  Laterality: N/A;  #12.8, 14, 15, 16 dilators used   SAVORY DILATION N/A 06/10/2015   Procedure: SAVORY DILATION;  Surgeon: Danie Binder, MD;  [facility];  Service: Endoscopy;  Laterality: N/A;   VAGINAL HYSTERECTOMY  1970's      Allergies  Allergen Reactions   Ace Inhibitors Cough    Patient doesn't recall   Keflex [Cephalexin] Diarrhea and Nausea And Vomiting   Nitrofurantoin Diarrhea and Nausea And Vomiting   Penicillins Hives and Other (See Comments)    Reaction:  Blisters on hands/feet  Has patient had a PCN reaction causing immediate rash, facial/tongue/throat swelling, SOB or lightheadedness with hypotension: No Has patient had a PCN reaction causing severe rash involving mucus membranes or skin necrosis: No Has patient had a PCN reaction that required hospitalization No Has patient had a PCN reaction occurring within the last 10 years: No If all of the above answers are "NO", then may proceed with Cephalosporin use.      Family History  Problem Relation Age of Onset   Hypertension Mother    Diabetes Mother    Heart failure Mother    Dementia Mother    Emphysema Father    Hypertension Father    Diabetes Brother    GER disease Brother    Hypertension Sister    Hypertension Sister    Cancer Other        family history    Diabetes Other        family history    Heart defect Other        famiily history    Arthritis Other        family history    Anesthesia problems Neg Hx    Hypotension Neg Hx    Malignant hyperthermia Neg Hx    Pseudochol deficiency Neg Hx    Colon cancer Neg Hx      Social History Cheryl Abbott reports that she quit smoking about 33 years ago. Her smoking use included cigarettes. She started smoking about 47 years ago. She has a 20.00 pack-year smoking history. She has been exposed to tobacco smoke. She has never used smokeless tobacco. Cheryl Abbott reports no history of alcohol use.   Review of Systems CONSTITUTIONAL: No weight loss, fever, chills, weakness or fatigue.  HEENT:  Eyes: No visual loss, blurred vision, double vision or yellow sclerae.No hearing loss, sneezing, congestion, runny nose or sore throat.  SKIN: No rash or itching.  CARDIOVASCULAR: per  hpi RESPIRATORY: No shortness of breath, cough or sputum.  GASTROINTESTINAL: No anorexia, nausea, vomiting or diarrhea. No abdominal pain or blood.  GENITOURINARY: No burning on urination, no polyuria NEUROLOGICAL: No headache, dizziness, syncope, paralysis, ataxia, numbness or tingling in the extremities. No change in bowel or bladder control.  MUSCULOSKELETAL: No muscle, back pain, joint pain or stiffness.  LYMPHATICS: No enlarged nodes. No history of splenectomy.  PSYCHIATRIC: No history of depression or anxiety.  ENDOCRINOLOGIC: No reports of sweating, cold or heat intolerance. No polyuria or polydipsia.  Marland Kitchen   Physical Examination Today's Vitals   08/14/22 1115  BP: 114/82  Pulse: (!) 107  SpO2: 95%  Weight: 184 lb (83.5 kg)   Body mass index is 29.7 kg/m.  Gen: resting comfortably, no acute distress HEENT: no scleral icterus, pupils equal round and reactive, no palptable cervical adenopathy,  CV: RRR, no m/rg, no jvd Resp: Clear to auscultation bilaterally GI: abdomen is soft, non-tender, non-distended, normal bowel sounds, no hepatosplenomegaly MSK: extremities are warm, no edema.  Skin: warm, no rash Neuro:  no focal deficits Psych: appropriate affect   Diagnostic Studies  03/2018 echo Study Conclusions   - Left ventricle: The cavity size was normal. Wall thickness was   increased in a pattern of mild LVH. Systolic function was normal.   The estimated ejection fraction was in the range of 55% to 60%.   Wall motion was normal; there were no regional wall motion   abnormalities. Indeterminate diastolic function. - Right ventricle: The cavity size was mildly dilated. - Right atrium: Central venous pressure (est): 3 mm Hg. - Atrial septum: No defect or patent foramen ovale was identified. - Tricuspid valve: There was trivial regurgitation. - Pulmonary arteries: PA peak pressure: 21 mm Hg (S). - Pericardium, extracardiac: There was no pericardial effusion.  01/2022  nuclear stress The study is normal. There are no perfusion defects consistent with prior infarct or current ischemia.  The study is low risk.   No ST deviation was noted.   Left ventricular function is normal. Nuclear stress EF: 64 %. The left ventricular ejection fraction is normal (55-65%). End diastolic cavity size is normal.    Assessment and Plan   1. Chest pain -recent negative stress test - symptoms improve with SL NG, try starting imdur '15mg'$  daily - EKG today shows sinus tach 107   2. HTN - at goal, continue current meds   3. Hyperlipidemia - given prior TIA would try to get LDL <70, we will increase crestor to '40mg'$  daily  F/u 6 months   Arnoldo Lenis, M.D.

## 2022-08-14 NOTE — Patient Instructions (Signed)
Medication Instructions:  Your physician has recommended you make the following change in your medication:  -Start Imdur 15 mg tablets once daily -Increase Crestor to 40 mg tablets once dailyN  *If you need a refill on your cardiac medications before your next appointment, please call your pharmacy*   Lab Work: None If you have labs (blood work) drawn today and your tests are completely normal, you will receive your results only by: [place] (if you have MyChart) OR A paper copy in the mail If you have any lab test that is abnormal or we need to change your treatment, we will call you to review the results.   Testing/Procedures: None   Follow-Up: At [facility], you and your health needs are our priority.  As part of our continuing mission to provide you with exceptional heart care, we have created designated Provider Care Teams.  These Care Teams include your primary Cardiologist (physician) and Advanced Practice Providers (APPs -  Physician Assistants and Nurse Practitioners) who all work together to provide you with the care you need, when you need it.  We recommend signing up for the patient portal called "MyChart".  Sign up information is provided on this After Visit Summary.  MyChart is used to connect with patients for Virtual Visits (Telemedicine).  Patients are able to view lab/test results, encounter notes, upcoming appointments, etc.  Non-urgent messages can be sent to your provider as well.   To learn more about what you can do with MyChart, go to [email or web address].    Your next appointment:   6 month(s)  Provider:   Carlyle Dolly, MD    Other Instructions

## 2022-08-18 DIAGNOSIS — R0902 Hypoxemia: Secondary | ICD-10-CM | POA: Diagnosis not present

## 2022-08-20 LAB — HM DIABETES EYE EXAM

## 2022-09-03 ENCOUNTER — Ambulatory Visit (INDEPENDENT_AMBULATORY_CARE_PROVIDER_SITE_OTHER): Payer: 59 | Admitting: Orthopedic Surgery

## 2022-09-03 ENCOUNTER — Other Ambulatory Visit: Payer: Self-pay | Admitting: Orthopedic Surgery

## 2022-09-03 ENCOUNTER — Other Ambulatory Visit: Payer: Self-pay | Admitting: Family Medicine

## 2022-09-03 ENCOUNTER — Encounter: Payer: Self-pay | Admitting: Orthopedic Surgery

## 2022-09-03 ENCOUNTER — Other Ambulatory Visit: Payer: Self-pay

## 2022-09-03 VITALS — BP 147/96 | HR 101 | Ht 66.5 in | Wt 181.6 lb

## 2022-09-03 DIAGNOSIS — M67432 Ganglion, left wrist: Secondary | ICD-10-CM | POA: Diagnosis not present

## 2022-09-03 DIAGNOSIS — G8929 Other chronic pain: Secondary | ICD-10-CM

## 2022-09-03 DIAGNOSIS — M25512 Pain in left shoulder: Secondary | ICD-10-CM

## 2022-09-03 MED ORDER — GABAPENTIN 300 MG PO CAPS: ORAL_CAPSULE | ORAL | 0 refills | Status: DC

## 2022-09-03 MED ORDER — ACETAMINOPHEN 500 MG PO TABS: 1000.0000 mg | ORAL_TABLET | Freq: Four times a day (QID) | ORAL | 0 refills | Status: DC | PRN

## 2022-09-03 MED ORDER — TRAMADOL HCL 50 MG PO TABS: 50.0000 mg | ORAL_TABLET | Freq: Four times a day (QID) | ORAL | 0 refills | Status: DC | PRN

## 2022-09-03 NOTE — Telephone Encounter (Signed)
Dr. Ruthe Mannan pt - patient called, lvm requesting a refill on Gabapentin 300mg , 90 quantity, one 3 times a day and Tramadol 50mg , 60 quantity, one every 6 hours prn to be sent to [facility] in [place].

## 2022-09-03 NOTE — Progress Notes (Signed)
Chief Complaint  Patient presents with   New Problem    Ganglion cyst of dorsum of left wrist Painful if she hits it or touches it     This is a new problem  This is a 77 year old female presents with a volar wrist ganglion which is tender to touch.  However she does not want it aspirated, she does not want surgery.  She does not want anything done to it.  Examination of the left wrist shows full range of motion no atrophy there is a volar wrist ganglion with an intact radial artery pulse normal Allen's test the area is tender  Since she does not want anything done to it we are going to leave it alone we did offer her aspiration and surgical excision

## 2022-09-03 NOTE — Progress Notes (Signed)
Meds ordered this encounter  Medications   traMADol (ULTRAM) 50 MG tablet    Sig: Take 1 tablet (50 mg total) by mouth every 6 (six) hours as needed.    Dispense:  60 tablet    Refill:  0    Not to exceed 5 additional fills before 01/12/2023

## 2022-09-18 DIAGNOSIS — R0902 Hypoxemia: Secondary | ICD-10-CM | POA: Diagnosis not present

## 2022-09-23 ENCOUNTER — Emergency Department (HOSPITAL_COMMUNITY): Payer: 59

## 2022-09-23 ENCOUNTER — Encounter (HOSPITAL_COMMUNITY): Payer: Self-pay | Admitting: Emergency Medicine

## 2022-09-23 ENCOUNTER — Inpatient Hospital Stay (HOSPITAL_COMMUNITY)
Admission: EM | Admit: 2022-09-23 | Discharge: 2022-09-27 | DRG: 854 | Disposition: A | Discharge status: Home Health | Payer: 59 | Attending: Internal Medicine | Admitting: Internal Medicine

## 2022-09-23 ENCOUNTER — Other Ambulatory Visit: Payer: Self-pay

## 2022-09-23 DIAGNOSIS — Z833 Family history of diabetes mellitus: Secondary | ICD-10-CM | POA: Diagnosis not present

## 2022-09-23 DIAGNOSIS — C9000 Multiple myeloma not having achieved remission: Secondary | ICD-10-CM | POA: Diagnosis not present

## 2022-09-23 DIAGNOSIS — N201 Calculus of ureter: Principal | ICD-10-CM | POA: Diagnosis present

## 2022-09-23 DIAGNOSIS — F32A Depression, unspecified: Secondary | ICD-10-CM | POA: Diagnosis not present

## 2022-09-23 DIAGNOSIS — G4733 Obstructive sleep apnea (adult) (pediatric): Secondary | ICD-10-CM | POA: Diagnosis present

## 2022-09-23 DIAGNOSIS — E663 Overweight: Secondary | ICD-10-CM | POA: Diagnosis present

## 2022-09-23 DIAGNOSIS — N3000 Acute cystitis without hematuria: Secondary | ICD-10-CM

## 2022-09-23 DIAGNOSIS — R531 Weakness: Secondary | ICD-10-CM | POA: Diagnosis not present

## 2022-09-23 DIAGNOSIS — I129 Hypertensive chronic kidney disease with stage 1 through stage 4 chronic kidney disease, or unspecified chronic kidney disease: Secondary | ICD-10-CM | POA: Diagnosis not present

## 2022-09-23 DIAGNOSIS — Z825 Family history of asthma and other chronic lower respiratory diseases: Secondary | ICD-10-CM | POA: Diagnosis not present

## 2022-09-23 DIAGNOSIS — K219 Gastro-esophageal reflux disease without esophagitis: Secondary | ICD-10-CM | POA: Diagnosis not present

## 2022-09-23 DIAGNOSIS — Z8673 Personal history of transient ischemic attack (TIA), and cerebral infarction without residual deficits: Secondary | ICD-10-CM

## 2022-09-23 DIAGNOSIS — N132 Hydronephrosis with renal and ureteral calculous obstruction: Secondary | ICD-10-CM | POA: Diagnosis present

## 2022-09-23 DIAGNOSIS — F411 Generalized anxiety disorder: Secondary | ICD-10-CM | POA: Diagnosis present

## 2022-09-23 DIAGNOSIS — E1122 Type 2 diabetes mellitus with diabetic chronic kidney disease: Secondary | ICD-10-CM | POA: Diagnosis not present

## 2022-09-23 DIAGNOSIS — Z880 Allergy status to penicillin: Secondary | ICD-10-CM

## 2022-09-23 DIAGNOSIS — E1151 Type 2 diabetes mellitus with diabetic peripheral angiopathy without gangrene: Secondary | ICD-10-CM | POA: Diagnosis not present

## 2022-09-23 DIAGNOSIS — A419 Sepsis, unspecified organism: Secondary | ICD-10-CM | POA: Diagnosis not present

## 2022-09-23 DIAGNOSIS — E876 Hypokalemia: Secondary | ICD-10-CM | POA: Diagnosis not present

## 2022-09-23 DIAGNOSIS — N1330 Unspecified hydronephrosis: Secondary | ICD-10-CM | POA: Diagnosis not present

## 2022-09-23 DIAGNOSIS — G4730 Sleep apnea, unspecified: Secondary | ICD-10-CM | POA: Diagnosis not present

## 2022-09-23 DIAGNOSIS — R000 Tachycardia, unspecified: Secondary | ICD-10-CM | POA: Diagnosis not present

## 2022-09-23 DIAGNOSIS — Z1152 Encounter for screening for COVID-19: Secondary | ICD-10-CM | POA: Diagnosis not present

## 2022-09-23 DIAGNOSIS — I10 Essential (primary) hypertension: Secondary | ICD-10-CM | POA: Diagnosis present

## 2022-09-23 DIAGNOSIS — Z87891 Personal history of nicotine dependence: Secondary | ICD-10-CM | POA: Diagnosis not present

## 2022-09-23 DIAGNOSIS — Z8579 Personal history of other malignant neoplasms of lymphoid, hematopoietic and related tissues: Secondary | ICD-10-CM

## 2022-09-23 DIAGNOSIS — M129 Arthropathy, unspecified: Secondary | ICD-10-CM | POA: Diagnosis present

## 2022-09-23 DIAGNOSIS — N136 Pyonephrosis: Secondary | ICD-10-CM | POA: Diagnosis present

## 2022-09-23 DIAGNOSIS — Z7951 Long term (current) use of inhaled steroids: Secondary | ICD-10-CM

## 2022-09-23 DIAGNOSIS — N390 Urinary tract infection, site not specified: Secondary | ICD-10-CM | POA: Diagnosis present

## 2022-09-23 DIAGNOSIS — Z7984 Long term (current) use of oral hypoglycemic drugs: Secondary | ICD-10-CM | POA: Diagnosis not present

## 2022-09-23 DIAGNOSIS — Z8249 Family history of ischemic heart disease and other diseases of the circulatory system: Secondary | ICD-10-CM

## 2022-09-23 DIAGNOSIS — N1832 Chronic kidney disease, stage 3b: Secondary | ICD-10-CM | POA: Diagnosis not present

## 2022-09-23 DIAGNOSIS — Z79899 Other long term (current) drug therapy: Secondary | ICD-10-CM

## 2022-09-23 DIAGNOSIS — W19XXXA Unspecified fall, initial encounter: Secondary | ICD-10-CM | POA: Diagnosis present

## 2022-09-23 DIAGNOSIS — F419 Anxiety disorder, unspecified: Secondary | ICD-10-CM | POA: Diagnosis present

## 2022-09-23 DIAGNOSIS — Z7982 Long term (current) use of aspirin: Secondary | ICD-10-CM | POA: Diagnosis not present

## 2022-09-23 DIAGNOSIS — Y92009 Unspecified place in unspecified non-institutional (private) residence as the place of occurrence of the external cause: Secondary | ICD-10-CM | POA: Diagnosis not present

## 2022-09-23 DIAGNOSIS — Z6828 Body mass index (BMI) 28.0-28.9, adult: Secondary | ICD-10-CM

## 2022-09-23 DIAGNOSIS — I700 Atherosclerosis of aorta: Secondary | ICD-10-CM | POA: Diagnosis not present

## 2022-09-23 DIAGNOSIS — E1121 Type 2 diabetes mellitus with diabetic nephropathy: Secondary | ICD-10-CM | POA: Diagnosis not present

## 2022-09-23 DIAGNOSIS — E785 Hyperlipidemia, unspecified: Secondary | ICD-10-CM | POA: Diagnosis not present

## 2022-09-23 DIAGNOSIS — E669 Obesity, unspecified: Secondary | ICD-10-CM | POA: Diagnosis present

## 2022-09-23 DIAGNOSIS — Z7985 Long-term (current) use of injectable non-insulin antidiabetic drugs: Secondary | ICD-10-CM

## 2022-09-23 DIAGNOSIS — M545 Low back pain, unspecified: Secondary | ICD-10-CM | POA: Diagnosis not present

## 2022-09-23 DIAGNOSIS — N200 Calculus of kidney: Secondary | ICD-10-CM

## 2022-09-23 DIAGNOSIS — N134 Hydroureter: Secondary | ICD-10-CM | POA: Diagnosis not present

## 2022-09-23 DIAGNOSIS — M79603 Pain in arm, unspecified: Secondary | ICD-10-CM | POA: Diagnosis not present

## 2022-09-23 DIAGNOSIS — G8929 Other chronic pain: Secondary | ICD-10-CM | POA: Diagnosis present

## 2022-09-23 DIAGNOSIS — I739 Peripheral vascular disease, unspecified: Secondary | ICD-10-CM | POA: Diagnosis present

## 2022-09-23 DIAGNOSIS — Z888 Allergy status to other drugs, medicaments and biological substances status: Secondary | ICD-10-CM

## 2022-09-23 LAB — COMPREHENSIVE METABOLIC PANEL
ALT: 15 U/L (ref 0–44)
AST: 18 U/L (ref 15–41)
Albumin: 3.4 g/dL — ABNORMAL LOW (ref 3.5–5.0)
Alkaline Phosphatase: 71 U/L (ref 38–126)
Anion gap: 8 (ref 5–15)
BUN: 15 mg/dL (ref 8–23)
CO2: 25 mmol/L (ref 22–32)
Calcium: 9.4 mg/dL (ref 8.9–10.3)
Chloride: 101 mmol/L (ref 98–111)
Creatinine, Ser: 1.53 mg/dL — ABNORMAL HIGH (ref 0.44–1.00)
GFR, Estimated: 35 mL/min — ABNORMAL LOW (ref >60)
Glucose, Bld: 108 mg/dL — ABNORMAL HIGH (ref 70–99)
Potassium: 3.5 mmol/L (ref 3.5–5.1)
Sodium: 134 mmol/L — ABNORMAL LOW (ref 135–145)
Total Bilirubin: 0.3 mg/dL (ref 0.3–1.2)
Total Protein: 8.5 g/dL — ABNORMAL HIGH (ref 6.5–8.1)

## 2022-09-23 LAB — LACTIC ACID, PLASMA: Lactic Acid, Venous: 1 mmol/L (ref 0.5–1.9)

## 2022-09-23 LAB — CBC WITH DIFFERENTIAL/PLATELET
Abs Immature Granulocytes: 0.06 10*3/uL (ref 0.00–0.07)
Basophils Absolute: 0 10*3/uL (ref 0.0–0.1)
Basophils Relative: 0 %
Eosinophils Absolute: 0.1 10*3/uL (ref 0.0–0.5)
Eosinophils Relative: 1 %
HCT: 35 % — ABNORMAL LOW (ref 36.0–46.0)
Hemoglobin: 11.4 g/dL — ABNORMAL LOW (ref 12.0–15.0)
Immature Granulocytes: 1 %
Lymphocytes Relative: 6 %
Lymphs Abs: 0.8 10*3/uL (ref 0.7–4.0)
MCH: 29.8 pg (ref 26.0–34.0)
MCHC: 32.6 g/dL (ref 30.0–36.0)
MCV: 91.6 fL (ref 80.0–100.0)
Monocytes Absolute: 1.3 10*3/uL — ABNORMAL HIGH (ref 0.1–1.0)
Monocytes Relative: 10 %
Neutro Abs: 10.5 10*3/uL — ABNORMAL HIGH (ref 1.7–7.7)
Neutrophils Relative %: 82 %
Platelets: 304 10*3/uL (ref 150–400)
RBC: 3.82 MIL/uL — ABNORMAL LOW (ref 3.87–5.11)
RDW: 16.6 % — ABNORMAL HIGH (ref 11.5–15.5)
WBC: 12.8 10*3/uL — ABNORMAL HIGH (ref 4.0–10.5)
nRBC: 0 % (ref 0.0–0.2)

## 2022-09-23 LAB — URINALYSIS, ROUTINE W REFLEX MICROSCOPIC
Bilirubin Urine: NEGATIVE
Glucose, UA: NEGATIVE mg/dL
Ketones, ur: NEGATIVE mg/dL
Nitrite: NEGATIVE
Protein, ur: 30 mg/dL — AB
Specific Gravity, Urine: 1.006 (ref 1.005–1.030)
WBC, UA: >50 WBC/hpf (ref 0–5)
pH: 8 (ref 5.0–8.0)

## 2022-09-23 LAB — CBG MONITORING, ED: Glucose-Capillary: 114 mg/dL — ABNORMAL HIGH (ref 70–99)

## 2022-09-23 LAB — CK: Total CK: 210 U/L (ref 38–234)

## 2022-09-23 LAB — SARS CORONAVIRUS 2 BY RT PCR: SARS Coronavirus 2 by RT PCR: NEGATIVE

## 2022-09-23 LAB — GLUCOSE, CAPILLARY: Glucose-Capillary: 118 mg/dL — ABNORMAL HIGH (ref 70–99)

## 2022-09-23 MED ORDER — GABAPENTIN 300 MG PO CAPS
300.0000 mg | ORAL_CAPSULE | Freq: Three times a day (TID) | ORAL | Status: DC | PRN
  Administered 2022-09-23 – 2022-09-27 (×7): 300 mg via ORAL
  Filled 2022-09-23 (×7): qty 1

## 2022-09-23 MED ORDER — LINAGLIPTIN 5 MG PO TABS
5.0000 mg | ORAL_TABLET | Freq: Every day | ORAL | Status: DC
  Administered 2022-09-25 – 2022-09-27 (×3): 5 mg via ORAL
  Filled 2022-09-23 (×4): qty 1

## 2022-09-23 MED ORDER — SODIUM CHLORIDE 0.9 % IV BOLUS
1000.0000 mL | Freq: Once | INTRAVENOUS | Status: AC
  Administered 2022-09-23: 1000 mL via INTRAVENOUS

## 2022-09-23 MED ORDER — PAROXETINE HCL 20 MG PO TABS
40.0000 mg | ORAL_TABLET | Freq: Every morning | ORAL | Status: DC
  Administered 2022-09-24 – 2022-09-27 (×4): 40 mg via ORAL
  Filled 2022-09-23 (×4): qty 2

## 2022-09-23 MED ORDER — ROSUVASTATIN CALCIUM 20 MG PO TABS
40.0000 mg | ORAL_TABLET | Freq: Every day | ORAL | Status: DC
  Administered 2022-09-23 – 2022-09-27 (×4): 40 mg via ORAL
  Filled 2022-09-23 (×5): qty 2

## 2022-09-23 MED ORDER — NITROGLYCERIN 0.4 MG SL SUBL: 0.4000 mg | SUBLINGUAL_TABLET | SUBLINGUAL | Status: DC | PRN

## 2022-09-23 MED ORDER — AMLODIPINE BESYLATE 5 MG PO TABS
10.0000 mg | ORAL_TABLET | Freq: Every day | ORAL | Status: DC
  Administered 2022-09-23 – 2022-09-27 (×4): 10 mg via ORAL
  Filled 2022-09-23 (×5): qty 2

## 2022-09-23 MED ORDER — UMECLIDINIUM BROMIDE 62.5 MCG/ACT IN AEPB: 1.0000 | INHALATION_SPRAY | Freq: Every day | RESPIRATORY_TRACT | Status: DC

## 2022-09-23 MED ORDER — FLUTICASONE FUROATE-VILANTEROL 100-25 MCG/ACT IN AEPB: 1.0000 | INHALATION_SPRAY | Freq: Every day | RESPIRATORY_TRACT | Status: DC

## 2022-09-23 MED ORDER — SODIUM CHLORIDE 0.9 % IV SOLN
1.0000 g | Freq: Once | INTRAVENOUS | Status: AC
  Administered 2022-09-23: 1 g via INTRAVENOUS
  Filled 2022-09-23: qty 10

## 2022-09-23 MED ORDER — GABAPENTIN 300 MG PO CAPS
300.0000 mg | ORAL_CAPSULE | Freq: Once | ORAL | Status: AC
  Administered 2022-09-23: 300 mg via ORAL
  Filled 2022-09-23: qty 1

## 2022-09-23 MED ORDER — TRAMADOL HCL 50 MG PO TABS
50.0000 mg | ORAL_TABLET | Freq: Once | ORAL | Status: AC
  Administered 2022-09-23: 50 mg via ORAL
  Filled 2022-09-23: qty 1

## 2022-09-23 MED ORDER — ISOSORBIDE MONONITRATE ER 30 MG PO TB24
15.0000 mg | ORAL_TABLET | Freq: Every day | ORAL | Status: DC
  Administered 2022-09-25 – 2022-09-27 (×3): 15 mg via ORAL
  Filled 2022-09-23 (×4): qty 1

## 2022-09-23 MED ORDER — ENOXAPARIN SODIUM 40 MG/0.4ML IJ SOSY
40.0000 mg | PREFILLED_SYRINGE | INTRAMUSCULAR | Status: DC
  Administered 2022-09-23 – 2022-09-26 (×4): 40 mg via SUBCUTANEOUS
  Filled 2022-09-23 (×4): qty 0.4

## 2022-09-23 MED ORDER — ONDANSETRON HCL 4 MG PO TABS: 4.0000 mg | ORAL_TABLET | Freq: Four times a day (QID) | ORAL | Status: DC | PRN

## 2022-09-23 MED ORDER — INSULIN ASPART 100 UNIT/ML IJ SOLN
0.0000 [IU] | Freq: Three times a day (TID) | INTRAMUSCULAR | Status: DC
  Administered 2022-09-24: 3 [IU] via SUBCUTANEOUS
  Administered 2022-09-25: 5 [IU] via SUBCUTANEOUS
  Administered 2022-09-25: 3 [IU] via SUBCUTANEOUS

## 2022-09-23 MED ORDER — PANTOPRAZOLE SODIUM 40 MG PO TBEC
40.0000 mg | DELAYED_RELEASE_TABLET | Freq: Two times a day (BID) | ORAL | Status: DC
  Administered 2022-09-23 – 2022-09-27 (×7): 40 mg via ORAL
  Filled 2022-09-23 (×8): qty 1

## 2022-09-23 MED ORDER — MOMETASONE FURO-FORMOTEROL FUM 100-5 MCG/ACT IN AERO
2.0000 | INHALATION_SPRAY | Freq: Two times a day (BID) | RESPIRATORY_TRACT | Status: DC
  Administered 2022-09-24 – 2022-09-27 (×7): 2 via RESPIRATORY_TRACT
  Filled 2022-09-23: qty 8.8

## 2022-09-23 MED ORDER — POTASSIUM CHLORIDE IN NACL 20-0.9 MEQ/L-% IV SOLN: INTRAVENOUS | Status: DC

## 2022-09-23 MED ORDER — TRAMADOL HCL 50 MG PO TABS
50.0000 mg | ORAL_TABLET | Freq: Four times a day (QID) | ORAL | Status: DC | PRN
  Administered 2022-09-23 – 2022-09-26 (×6): 50 mg via ORAL
  Filled 2022-09-23 (×6): qty 1

## 2022-09-23 MED ORDER — IOHEXOL 300 MG/ML  SOLN
80.0000 mL | Freq: Once | INTRAMUSCULAR | Status: AC | PRN
  Administered 2022-09-23: 80 mL via INTRAVENOUS

## 2022-09-23 MED ORDER — DULAGLUTIDE 0.75 MG/0.5ML ~~LOC~~ SOAJ: 0.7500 mg | SUBCUTANEOUS | Status: DC

## 2022-09-23 MED ORDER — ACETAMINOPHEN 500 MG PO TABS
1000.0000 mg | ORAL_TABLET | Freq: Four times a day (QID) | ORAL | Status: DC | PRN
  Administered 2022-09-23 – 2022-09-26 (×2): 1000 mg via ORAL
  Filled 2022-09-23 (×2): qty 2

## 2022-09-23 MED ORDER — ONDANSETRON HCL 4 MG/2ML IJ SOLN: 4.0000 mg | Freq: Four times a day (QID) | INTRAMUSCULAR | Status: DC | PRN

## 2022-09-23 MED ORDER — INSULIN ASPART 100 UNIT/ML IJ SOLN: 0.0000 [IU] | Freq: Every day | INTRAMUSCULAR | Status: DC

## 2022-09-23 NOTE — ED Notes (Signed)
Patient transported to CT 

## 2022-09-23 NOTE — Progress Notes (Signed)
Patient pulled out her IV access. Patient stated, "I thought I was leaving and going home, what time is my surgery in the morning". Patient educated. Plan of care ongoing.

## 2022-09-23 NOTE — ED Provider Notes (Signed)
[place] EMERGENCY DEPARTMENT AT [facility] Provider Note   CSN: [id number] Arrival date & time: 09/23/22  0734     History  Chief Complaint  Patient presents with   Weakness    Cheryl Abbott is a 77 y.o. female.  Pt is a 77 yo female with pmhx significant for anxiety, hld, htn, depression, dm2, chronic pain, arthritis, osa, kidney stones, and ckd.  Pt said she has been feeling weak for months.  She feels like it's getting worse.  She feels like her knees buckle when she stands up.  Today, she got out of bed and fell.  She did hit her head, but denies loc.  No blood thinners.  Pt feels like she may have a UTI.  She also has worsening back pain since fall.       Home Medications Prior to Admission medications   Medication Sig Start Date End Date Taking? Authorizing Provider  acetaminophen (TYLENOL) 500 MG tablet Take 2 tablets (1,000 mg total) by mouth every 6 (six) hours as needed for mild pain. 09/03/22   Vickki Hearing, MD  amLODipine (NORVASC) 10 MG tablet TAKE 1 TABLET BY MOUTH EVERY DAY 05/11/22   Kerri Perches, MD  aspirin EC 81 MG tablet Take 81 mg by mouth daily. Swallow whole.    [provider]  Continuous Blood Gluc Receiver (FREESTYLE LIBRE 2 READER) DEVI Use to test daily. Dx: E11.21 07/10/22   Kerri Perches, MD  Continuous Blood Gluc Sensor (FREESTYLE [place] 2 SENSOR) MISC Use to test daily dx: E11.21 07/10/22   Kerri Perches, MD  diclofenac Sodium (VOLTAREN) 1 % GEL APPLY 4 G TOPICALLY 4 (FOUR) TIMES DAILY AS NEEDED (PAIN). 06/18/22   Vickki Hearing, MD  diphenhydramine-acetaminophen (TYLENOL PM) 25-500 MG TABS tablet Take 2 tablets by mouth at bedtime.    [provider]  Dulaglutide (TRULICITY) 0.75 MG/0.5ML SOPN INJECT 0.75 MG INTO THE SKIN WEEKLY 07/12/22   Kerri Perches, MD  Fluticasone-Umeclidin-Vilant (TRELEGY ELLIPTA) 100-62.5-25 MCG/ACT AEPB Inhale 1 puff into the lungs daily. 07/20/21   Kerri Perches, MD  gabapentin (NEURONTIN) 300 MG capsule TAKE 1 CAPSULE BY MOUTH 3 TIMES DAILY AS NEEDED. 09/03/22   Vickki Hearing, MD  glucose blood [facility] ULTRA) test strip USE AS DIRECTED 08/13/22   Kerri Perches, MD  isosorbide mononitrate (IMDUR) 30 MG 24 hr tablet Take 0.5 tablets (15 mg total) by mouth daily. 08/14/22 08/09/23  Antoine Poche, MD  nitroGLYCERIN (NITROSTAT) 0.4 MG SL tablet Place 1 tablet (0.4 mg total) under the tongue every 5 (five) minutes as needed for chest pain. Up to 3 tablets, then call 911. 07/19/21 01/27/79  [place] Ouch, Lennart Pall, PA-C  OneTouch Delica Lancets 30G MISC Once daily testing dx e11.9 01/07/20   Kerri Perches, MD  pantoprazole (PROTONIX) 40 MG tablet TAKE 1 TABLET BY MOUTH TWICE A DAY 07/12/22   Kerri Perches, MD  PARoxetine (PAXIL) 40 MG tablet TAKE 1 TABLET (40 MG TOTAL) BY MOUTH IN THE MORNING 09/03/22   Kerri Perches, MD  rosuvastatin (CRESTOR) 40 MG tablet Take 1 tablet (40 mg total) by mouth daily. 08/14/22 08/09/23  Antoine Poche, MD  SYMBICORT 80-4.5 MCG/ACT inhaler INHALE 2 PUFFS INTO THE LUNGS TWICE A DAY 05/21/22   Kerri Perches, MD  TRADJENTA 5 MG TABS tablet TAKE 1 TABLET BY MOUTH EVERY DAY 06/18/22   Kerri Perches, MD  traMADol Janean Sark) 50  MG tablet Take 1 tablet (50 mg total) by mouth every 6 (six) hours as needed. 09/03/22   Vickki Hearing, MD  triamcinolone cream (KENALOG) 0.1 % Apply 1 application topically 2 (two) times daily. Patient taking differently: Apply 1 application  topically 2 (two) times daily as needed. 07/28/21   Donell Beers, FNP  UNABLE TO FIND Diabetic shoes x 1 pair, inserts x 3 pair  DX E11.9 05/09/21   Kerri Perches, MD  VITAMIN D, CHOLECALCIFEROL, PO Take 1 tablet by mouth daily.    [provider]      Allergies    Ace inhibitors, Cephalexin, Nitrofurantoin, and Penicillins    Review of Systems   Review of Systems  Genitourinary:  Positive for dysuria.   Musculoskeletal:  Positive for back pain.  Neurological:  Positive for weakness.  All other systems reviewed and are negative.   Physical Exam Updated Vital Signs BP 126/76   Pulse 98   Temp 98.6 F (37 C) (Oral)   Resp (!) 25   Ht 5' 6.5" (1.689 m)   Wt 82 kg   SpO2 93%   BMI 28.74 kg/m  Physical Exam Vitals and nursing note reviewed.  Constitutional:      Appearance: Normal appearance.  HENT:     Head: Normocephalic and atraumatic.     Right Ear: External ear normal.     Left Ear: External ear normal.     Nose: Nose normal.     Mouth/Throat:     Mouth: Mucous membranes are moist.     Pharynx: Oropharynx is clear.  Eyes:     Extraocular Movements: Extraocular movements intact.     Conjunctiva/sclera: Conjunctivae normal.     Pupils: Pupils are equal, round, and reactive to light.  Cardiovascular:     Rate and Rhythm: Normal rate and regular rhythm.     Pulses: Normal pulses.     Heart sounds: Normal heart sounds.  Pulmonary:     Effort: Pulmonary effort is normal.     Breath sounds: Normal breath sounds.  Abdominal:     General: Abdomen is flat. Bowel sounds are normal.     Palpations: Abdomen is soft.  Musculoskeletal:        General: Normal range of motion.     Cervical back: Normal range of motion and neck supple.  Skin:    General: Skin is warm.     Capillary Refill: Capillary refill takes less than 2 seconds.  Neurological:     General: No focal deficit present.     Mental Status: She is alert and oriented to person, place, and time.  Psychiatric:        Mood and Affect: Mood normal.        Behavior: Behavior normal.     ED Results / Procedures / Treatments   Labs (all labs ordered are listed, but only abnormal results are displayed) Labs Reviewed  CBC WITH DIFFERENTIAL/PLATELET - Abnormal; Notable for the following components:      Result Value   WBC 12.8 (*)    RBC 3.82 (*)    Hemoglobin 11.4 (*)    HCT 35.0 (*)    RDW 16.6 (*)    Neutro  Abs 10.5 (*)    Monocytes Absolute 1.3 (*)    All other components within normal limits  COMPREHENSIVE METABOLIC PANEL - Abnormal; Notable for the following components:   Sodium 134 (*)    Glucose, Bld 108 (*)  Creatinine, Ser 1.53 (*)    Total Protein 8.5 (*)    Albumin 3.4 (*)    GFR, Estimated 35 (*)    All other components within normal limits  URINALYSIS, ROUTINE W REFLEX MICROSCOPIC - Abnormal; Notable for the following components:   APPearance HAZY (*)    Hgb urine dipstick MODERATE (*)    Protein, ur 30 (*)    Leukocytes,Ua LARGE (*)    Bacteria, UA RARE (*)    All other components within normal limits  CBG MONITORING, ED - Abnormal; Notable for the following components:   Glucose-Capillary 114 (*)    All other components within normal limits  SARS CORONAVIRUS 2 BY RT PCR  URINE CULTURE  LACTIC ACID, PLASMA  CK    EKG EKG Interpretation  Date/Time:  Sunday September 23 2022 07:48:11 EDT Ventricular Rate:  100 PR Interval:  170 QRS Duration: 77 QT Interval:  340 QTC Calculation: 439 R Axis:   -30 Text Interpretation: Sinus tachycardia Left axis deviation Low voltage, precordial leads Since last tracing rate faster Confirmed by Jacalyn Lefevre [phone]) on 09/23/2022 2:21:02 PM  Radiology CT L-SPINE NO CHARGE  Result Date: 09/23/2022 CLINICAL DATA:  Fall, back pain EXAM: CT LUMBAR SPINE WITHOUT CONTRAST TECHNIQUE: Multidetector CT imaging of the lumbar spine was performed without intravenous contrast administration. Multiplanar CT image reconstructions were also generated. RADIATION DOSE REDUCTION: This exam was performed according to the departmental dose-optimization program which includes automated exposure control, adjustment of the mA and/or kV according to patient size and/or use of iterative reconstruction technique. COMPARISON:  02/13/2022 FINDINGS: Segmentation: 5 lumbar type vertebrae. Alignment: Normal. Vertebrae: No acute fracture or focal pathologic process.  Paraspinal and other soft tissues: See dedicated abdominopelvic CT report for assessment of the intra-abdominal findings. No paravertebral inflammatory changes or fluid collections. Disc levels: Intervertebral disc heights are preserved. Moderate bilateral facet arthropathy most pronounced at L3-L4 and L4-L5. Foraminal narrowing appears most pronounced at L4-L5 on the right. No evidence to suggest high-grade canal stenosis by CT. IMPRESSION: 1. No acute fracture or traumatic malalignment of the lumbar spine. 2. Moderate bilateral facet arthropathy most pronounced at L3-L4 and L4-L5. Foraminal narrowing appears most pronounced at L4-L5 on the right. Electronically Signed   By: Duanne Guess D.O.   On: 09/23/2022 13:52   CT ABDOMEN PELVIS W CONTRAST  Result Date: 09/23/2022 CLINICAL DATA:  Weakness, pyelonephritis. EXAM: CT ABDOMEN AND PELVIS WITH CONTRAST TECHNIQUE: Multidetector CT imaging of the abdomen and pelvis was performed using the standard protocol following bolus administration of intravenous contrast. RADIATION DOSE REDUCTION: This exam was performed according to the departmental dose-optimization program which includes automated exposure control, adjustment of the mA and/or kV according to patient size and/or use of iterative reconstruction technique. CONTRAST:  80mL OMNIPAQUE IOHEXOL 300 MG/ML  SOLN COMPARISON:  February 13, 2022. FINDINGS: Lower chest: Minimal bibasilar subsegmental atelectasis. Hepatobiliary: No focal liver abnormality is seen. No gallstones, gallbladder wall thickening, or biliary dilatation. Pancreas: Unremarkable. No pancreatic ductal dilatation or surrounding inflammatory changes. Spleen: Normal in size without focal abnormality. Adrenals/Urinary Tract: Adrenal glands appear normal. Mild nonobstructive left nephrolithiasis. Minimal left hydroureteronephrosis is noted without obstructing calculus. Moderate to severe right hydroureteronephrosis is noted secondary to 6 mm  calculus in midportion of right ureter. Urinary bladder is unremarkable. Stomach/Bowel: Stomach is within normal limits. Appendix appears normal. No evidence of bowel wall thickening, distention, or inflammatory changes. Diverticulosis of descending and sigmoid colon is noted without inflammation. Vascular/Lymphatic: Aortic atherosclerosis. No enlarged  abdominal or pelvic lymph nodes. Reproductive: Status post hysterectomy. No adnexal masses. Other: No abdominal wall hernia or abnormality. No abdominopelvic ascites. Musculoskeletal: No acute or significant osseous findings. IMPRESSION: Moderate to severe right hydroureteronephrosis is noted secondary to 6 mm calculus in midportion of right ureter. Nonobstructive left nephrolithiasis. Minimal left hydronephrosis is noted without obstructing calculus. Diverticulosis of descending and sigmoid colon without inflammation. Aortic Atherosclerosis (ICD10-I70.0). Electronically Signed   By: Lupita Raider M.D.   On: 09/23/2022 13:50   DG Chest Portable 1 View  Result Date: 09/23/2022 CLINICAL DATA:  Altered mental status, generalized weakness. EXAM: PORTABLE CHEST 1 VIEW COMPARISON:  Chest x-rays dated 03/21/2022 and 06/07/2021. FINDINGS: Heart size and mediastinal contours are stable. Lungs are clear. No pleural effusion or pneumothorax is seen. Chronic elevation of the RIGHT hemidiaphragm. Osseous structures about the chest are unremarkable. IMPRESSION: No active disease. No evidence of pneumonia or pulmonary edema. Electronically Signed   By: Bary Richard M.D.   On: 09/23/2022 09:09   CT Head Wo Contrast  Result Date: 09/23/2022 CLINICAL DATA:  Mental status change with unknown cause. EXAM: CT HEAD WITHOUT CONTRAST TECHNIQUE: Contiguous axial images were obtained from the base of the skull through the vertex without intravenous contrast. RADIATION DOSE REDUCTION: This exam was performed according to the departmental dose-optimization program which includes  automated exposure control, adjustment of the mA and/or kV according to patient size and/or use of iterative reconstruction technique. COMPARISON:  03/07/2016 FINDINGS: Brain: No evidence of acute infarction, hemorrhage, hydrocephalus, extra-axial collection or mass lesion/mass effect. Generalized cerebral volume loss with mild chronic small vessel ischemia. Vascular: No hyperdense vessel or unexpected calcification. Skull: Normal. Negative for fracture or focal lesion. Sinuses/Orbits: Negative IMPRESSION: No acute finding.  Stable head CT when compared to 2017. Electronically Signed   By: Tiburcio Pea M.D.   On: 09/23/2022 09:05    Procedures Procedures    Medications Ordered in ED Medications  sodium chloride 0.9 % bolus 1,000 mL (1,000 mLs Intravenous Bolus 09/23/22 0906)  traMADol (ULTRAM) tablet 50 mg (50 mg Oral Given 09/23/22 1200)  cefTRIAXone (ROCEPHIN) 1 g in sodium chloride 0.9 % 100 mL IVPB (0 g Intravenous Stopped 09/23/22 1231)  gabapentin (NEURONTIN) capsule 300 mg (300 mg Oral Given 09/23/22 1200)  iohexol (OMNIPAQUE) 300 MG/ML solution 80 mL (80 mLs Intravenous Contrast Given 09/23/22 1256)    ED Course/ Medical Decision Making/ A&P                             Medical Decision Making Amount and/or Complexity of Data Reviewed Labs: ordered. Radiology: ordered.  Risk Prescription drug management.   This patient presents to the ED for concern of weakness, this involves an extensive number of treatment options, and is a complaint that carries with it a high risk of complications and morbidity.  The differential diagnosis includes infection, electrolyte abn   Co morbidities that complicate the patient evaluation  anxiety, hld, htn, depression, dm2, chronic pain, arthritis, osa, kidney stones, and ckd   Additional history obtained:  Additional history obtained from epic chart review External records from outside source obtained and reviewed including EMS  reprot   Lab Tests:  I Ordered, and personally interpreted labs.  The pertinent results include:  cbc with wbc elevated at 12.8, hgb 11.4 (chronic), cmp with cr sl elevated at 1.53 (chronic), covid neg, ck neg, lactic nl, ua + protein, lg LE, >50 wbcs  Imaging Studies ordered:  I ordered imaging studies including ct head, CXR, ct abd/pelvis  I independently visualized and interpreted imaging which showed  CT head: No acute finding.  Stable head CT when compared to 2017.  CXR: No active disease. No evidence of pneumonia or pulmonary edema.  CT abd/pelvis: Moderate to severe right hydroureteronephrosis is noted secondary to  6 mm calculus in midportion of right ureter.    Nonobstructive left nephrolithiasis. Minimal left hydronephrosis is  noted without obstructing calculus.    Diverticulosis of descending and sigmoid colon without inflammation.    Aortic Atherosclerosis (ICD10-I70.0).  CT lumbar spine:  No acute fracture or traumatic malalignment of the lumbar spine.  2. Moderate bilateral facet arthropathy most pronounced at L3-L4 and  L4-L5. Foraminal narrowing appears most pronounced at L4-L5 on the  right.   I agree with the radiologist interpretation   Cardiac Monitoring:  The patient was maintained on a cardiac monitor.  I personally viewed and interpreted the cardiac monitored which showed an underlying rhythm of: nsr   Medicines ordered and prescription drug management:  I ordered medication including rocephin  for uti, IVFs for dehydration, tramadol and neurontin for pain Reevaluation of the patient after these medicines showed that the patient improved I have reviewed the patients home medicines and have made adjustments as needed   Test Considered:  ct   Critical Interventions:  Pain control/abx   Consultations Obtained:  I requested consultation with the urologist (Dr. Ronne Binning),  and discussed lab and imaging findings as well as pertinent plan -  he recommends admission to the hospitalists for IV abx, npo after midnight, and stent placement in am Pt d/w Dr. Darnelle Catalan ([facility]) for admission.   Problem List / ED Course:  Obstructive kidney stone with uti:  pt started on rocephin.  Urology will place a stent in am.  Hospitalists to admit.   Reevaluation:  After the interventions noted above, I reevaluated the patient and found that they have :improved   Social Determinants of Health:  Lives at home   Dispostion:  After consideration of the diagnostic results and the patients response to treatment, I feel that the patent would benefit from admission.          Final Clinical Impression(s) / ED Diagnoses Final diagnoses:  Ureteral calculus, right  Acute cystitis without hematuria    Rx / DC Orders ED Discharge Orders     None         Jacalyn Lefevre, MD 09/23/22 1424

## 2022-09-23 NOTE — ED Notes (Signed)
800 mL noted in canister; changed pts urine canister to a new one

## 2022-09-23 NOTE — ED Triage Notes (Signed)
Pt arrived by [facility]. Pt states she has had generalized weakness for a few months that has progressively gotten worse. States her knees become weak when she stands.   Pt states she slipped out of bed this morning striking her head to her nightstand. Denies LOC. Denies taking thinners.   Pt also states she thinks she has a UTI.

## 2022-09-23 NOTE — H&P (Signed)
History and Physical:    SISTER CARBONE   UEA:[id number] DOB: 11-21-1945 DOA: 09/23/2022  Referring MD/provider: Dr. Particia Nearing PCP: Kerri Perches, MD   Patient coming from: Home via EMS  Chief Complaint: Fall/weakness  History of Present Illness:   Cheryl Abbott is an 77 y.o. female with a past medical history of stage III CKD, chronic shoulder pain, chronic back pain, osteoarthritis, anxiety disorder, hypertension, hyperlipidemia, type 2 diabetes, nephrolithiasis and UTIs who presented to the ED today for evaluation of weakness and falls at home.  Patient reportedly had a fall today where her legs gave out on her.  She lives alone and was brought to the ED via EMS.  She endorses some upper respiratory congestion and a nonproductive cough as well as some nausea.  She has chronic shoulder pain but it is worse since the fall.  She also reports that she hit her knees and her head during her fall.  ED Course: Argelia was noted to be mildly hypertensive, tachycardic and tachypneic on arrival.  Labs are notable for a WBC of 12.8, hemoglobin 11.4, sodium 134, glucose 108, creatinine 1.53/GFR 35 (consistent with her usual baseline renal function), COVID-negative, CK2 110, lactic acid 1.0, urinalysis with a large amount of leukocytes and culture pending.  Chest x-ray was negative.  CT of the head was stable with no acute findings.  CT of the lumbar spine showed no acute fracture or cause for her weakness.  She did have arthropathy and some foraminal narrowing but no frank spinal stenosis.  CT of the abdomen and pelvis showed moderate to severe right hydroureteronephrosis with a 6 mm calculus in the right ureter.  Because of these findings, the ED physician consulted urology who recommended admission for IV antibiotics and plan for stent placement tomorrow.  ROS:   Review of Systems  Constitutional:  Positive for malaise/fatigue.  HENT:  Positive for congestion.   Eyes: Negative.    Respiratory:  Positive for cough.   Cardiovascular: Negative.   Gastrointestinal:  Positive for nausea and vomiting.  Genitourinary:  Positive for dysuria and flank pain.  Musculoskeletal:  Positive for falls and joint pain.  Skin: Negative.   Neurological:  Positive for weakness.  Endo/Heme/Allergies: Negative.   Psychiatric/Behavioral: Negative.      Past Medical History:   Past Medical History:  Diagnosis Date   Anxiety disorder    Arthritis    Chronic back pain    Chronic pain of both shoulders    per pt told due to cervical spine pinched nerve   CKD (chronic kidney disease), stage III    nephrologist--- dr Wolfgang Phoenix;   due to tubular necrosis   Claustrophobia    Depression    Edema of both lower extremities    GERD (gastroesophageal reflux disease)    Headache    Hemorrhoids    w/ intermittant bleeding   History of adenomatous polyp of colon    History of kidney stones    Hx-TIA (transient ischemic attack) 02/26/2016   per pt no residual   Hydronephrosis, left    followed by urologist--- dr Retta Diones;   persistant   Hyperlipidemia    Hypertension    Iron deficiency    followed by [facility] w/ hx iron infusions   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   oncologist--- [facility];  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, active survillence   Lactose intolerance in adult 02/01/2016   Left ureteral calculus  Lumbar disc disease with radiculopathy    Nephrolithiasis    per CT 08-11-2021  left > right   Neuropathy, peripheral    Nocturia more than twice per night    Normocytic anemia    OSA (obstructive sleep apnea)    per study 2007;   per pt no Bipap use since 2021   Renal atrophy, bilateral    Renal cyst, right    Sciatica    Sigmoid diverticulosis    Type 2 diabetes mellitus     Past Surgical History:   Past Surgical History:  Procedure Laterality Date   BIOPSY N/A 03/17/2013   Procedure: GASTRIC BIOPSIES;  Surgeon: West Bali, MD;   Location: [facility];  Service: Endoscopy;  Laterality: N/A;   BIOPSY  06/10/2015   Procedure: BIOPSY;  Surgeon: West Bali, MD;  [facility];  Service: Endoscopy;;  gastric biopsy   CARDIAC CATHETERIZATION  05/11/2003   @ [facility] by dr t. Tresa Endo;  Abnormal cardiolite/   Normal coronary arteries and normal LVF,  ef  63%   CARDIOVASCULAR STRESS TEST  01/01/2014   normal lexiscan cardiolite/  no ischemia/ infarct/  normal LV function and wall motion ,  ef 81%   COLONOSCOPY WITH PROPOFOL N/A 02/26/2017   Procedure: COLONOSCOPY WITH PROPOFOL;  Surgeon: West Bali, MD;  [facility];  Service: Endoscopy;  Laterality: N/A;  11:00am   COLONOSCOPY WITH PROPOFOL N/A 09/22/2019   Procedure: COLONOSCOPY WITH PROPOFOL;  Surgeon: West Bali, MD;  [facility];  Service: Endoscopy;  Laterality: N/A;  1:15   CYSTO/   RIGHT URETEROSOCOPY LASER LITHOTRIPSY STONE EXTRACTION  10/13/2004   CYSTO/  RIGHT RETROGRADE PYELOGRAM/  PLACMENT RIGHT URETERAL STENT  01/10/2010   CYSTOSCOPY W/ URETERAL STENT PLACEMENT Right 08/19/2015   Procedure: CYSTOSCOPY WITH RETROGRADE PYELOGRAM/URETERAL STENT PLACEMENT;  Surgeon: Marcine Matar, MD;  [facility]: [facility];  Service: Urology;  Laterality: Right;   CYSTOSCOPY W/ URETERAL STENT PLACEMENT Left 02/23/2020   Procedure: CYSTOSCOPY LEFT  RETROGRADE PYELOGRAM LEFT URETEROSCOPY URETERAL STENT PLACEMENT;  Surgeon: Marcine Matar, MD;  Location: [facility];  Service: Urology;  Laterality: Left;   CYSTOSCOPY WITH RETROGRADE PYELOGRAM, URETEROSCOPY AND STENT PLACEMENT Left 10/21/2014   Procedure: CYSTOSCOPY WITH RETROGRADE PYELOGRAM, URETEROSCOPY AND STENT PLACEMENT;  Surgeon: Marcine Matar, MD;  Location[facility];  Service: Urology;  Laterality: Left;   CYSTOSCOPY WITH RETROGRADE PYELOGRAM, URETEROSCOPY AND STENT PLACEMENT Right 11/22/2015   Procedure: CYSTOSCOPY, RIGHT URETERAL STENT REMOVAL;  RIGHT RETROGRADE PYELOGRAM, RIGHT URETEROSCOPY WITH BALLOON DILATION; RIGHT URETERAL STENT PLACEMENT;  Surgeon: Marcine Matar, MD;  [facility];  Service: Urology;  Laterality: Right;   CYSTOSCOPY WITH RETROGRADE PYELOGRAM, URETEROSCOPY AND STENT PLACEMENT Left 10/18/2020   Procedure: CYSTOSCOPY WITH LEFT RETROGRADE PYELOGRAM, LEFT URETEROSCOPY  WITH LASER AND LEFT URETERAL STENT PLACEMENT;  Surgeon: Marcine Matar, MD;  [facility];  Service: Urology;  Laterality: Left;   CYSTOSCOPY WITH RETROGRADE PYELOGRAM, URETEROSCOPY AND STENT PLACEMENT Left 10/26/2021   Procedure: CYSTOSCOPY WITH RETROGRADE PYELOGRAM, URETEROSCOPY, STONE EXTRACTION  AND STENT PLACEMENT;  Surgeon: Marcine Matar, MD;  Location: [facility];  Service: Urology;  Laterality: Left;   CYSTOSCOPY/RETROGRADE/URETEROSCOPY/STONE EXTRACTION WITH BASKET Bilateral 08/02/2015   Procedure: CYSTOSCOPY; BILATERAL RETROGRADE PYELOGRAMS; BILATERAL URETEROSCOPY, STONE EXTRACTION WITH BASKET;  Surgeon: Marcine Matar, MD;  [facility];  Service: Urology;  Laterality: Bilateral;   CYSTOSCOPY/RETROGRADE/URETEROSCOPY/STONE EXTRACTION WITH BASKET Right 06/28/2015   Procedure: CYSTOSCOPY/RETROGRADE/URETEROSCOPY/STONE EXTRACTION  WITH BASKET, RIGHT URETERAL DOUBLE J STENT PLACEMENT;  Surgeon: Marcine Matar, MD;  [facility];  Service: Urology;  Laterality: Right;   ESOPHAGOGASTRODUODENOSCOPY (EGD) WITH PROPOFOL N/A 03/17/2013   Procedure: ESOPHAGOGASTRODUODENOSCOPY (EGD) WITH PROPOFOL;  Surgeon: West Bali, MD;  [facility];  Service: Endoscopy;  Laterality: N/A;   ESOPHAGOGASTRODUODENOSCOPY (EGD) WITH PROPOFOL N/A 06/10/2015   Distal gastritis, distal esophageal stricture s/p dilation   ESOPHAGOGASTRODUODENOSCOPY (EGD) WITH PROPOFOL N/A 02/26/2017   Procedure: ESOPHAGOGASTRODUODENOSCOPY (EGD) WITH PROPOFOL;  Surgeon: West Bali, MD;  [facility];  Service: Endoscopy;   Laterality: N/A;   FLEXIBLE SIGMOIDOSCOPY  09/11/2011   [facility]:QIONGEXB Internal hemorrhoids   HOLMIUM LASER APPLICATION Left 10/21/2014   Procedure: HOLMIUM LASER APPLICATION;  Surgeon: Marcine Matar, MD;  Location: [facility];  Service: Urology;  Laterality: Left;   HOLMIUM LASER APPLICATION Right 06/28/2015   Procedure: HOLMIUM LASER APPLICATION;  Surgeon: Marcine Matar, MD;  [facility];  Service: Urology;  Laterality: Right;   HOLMIUM LASER APPLICATION  02/23/2020   Procedure: HOLMIUM LASER APPLICATION;  Surgeon: Marcine Matar, MD;  [facility];  Service: Urology;;   HOLMIUM LASER APPLICATION Left 10/18/2020   Procedure: HOLMIUM LASER APPLICATION;  Surgeon: Marcine Matar, MD;  [facility];  Service: Urology;  Laterality: Left;   MASS EXCISION Left 03/04/2015   Procedure: EXCISION OF SOFT TISSUE NEOPLASM LEFT ARM;  Surgeon: Franky Macho, MD;  Location: [facility];  Service: General;  Laterality: Left;   PERCUTANEOUS NEPHROSTOLITHOTOMY Bilateral 05/2010   @[facility]    POLYPECTOMY N/A 03/17/2013   Procedure: GASTRIC POLYPECTOMY;  Surgeon: West Bali, MD;  [facility];  Service: Endoscopy;  Laterality: N/A;   POLYPECTOMY  02/26/2017   Procedure: POLYPECTOMY;  Surgeon: West Bali, MD;  [facility];  Service: Endoscopy;;  polyp at cecum, ascending colon polyps x3, hepatic flexure polyps x8, transverse colon polyps x8    POLYPECTOMY  09/22/2019   Procedure: POLYPECTOMY;  Surgeon: West Bali, MD;  [facility];  Service: Endoscopy;;   REMOVAL RIGHT THIGH CYST  2006   SAVORY DILATION N/A 03/17/2013   Procedure: SAVORY DILATION;  Surgeon: West Bali, MD;  Location[facility];  Service: Endoscopy;  Laterality: N/A;  #12.8, 14, 15, 16 dilators used   SAVORY DILATION N/A 06/10/2015   Procedure: SAVORY DILATION;  Surgeon: West Bali, MD;  [facility];  Service: Endoscopy;  Laterality: N/A;   VAGINAL  HYSTERECTOMY  1970's    Social History:   Social History   Socioeconomic History   Marital status: Divorced    Spouse name: Not on file   Number of children: 2   Years of education: 12   Highest education level: 12th grade  Occupational History   Occupation: retired   Tobacco Use   Smoking status: Former    Packs/day: 1.00    Years: 20.00    Additional pack years: 0.00    Total pack years: 20.00    Types: Cigarettes    Start date: 09/21/1974    Quit date: 09/20/1988    Years since quitting: 34.0    Passive exposure: Past   Smokeless tobacco: Never  Vaping Use   Vaping Use: Never used  Substance and Sexual Activity   Alcohol use: No    Alcohol/week: 0.0 standard drinks of alcohol   Drug use: No   Sexual activity: Not Currently    Partners: Male    Birth  control/protection: Surgical  Other Topics Concern   Not on file  Social History Narrative   Patient is right handed   Patient drinks some caffeine daily.   Social Determinants of Health   Financial Resource Strain: Low Risk  (07/06/2022)   Overall Financial Resource Strain (CARDIA)    Difficulty of Paying Living Expenses: Not hard at all  Food Insecurity: No Food Insecurity (07/06/2022)   Hunger Vital Sign    Worried About Running Out of Food in the Last Year: Never true    Ran Out of Food in the Last Year: Never true  Transportation Needs: No Transportation Needs (07/06/2022)   PRAPARE - Administrator, Civil Service (Medical): No    Lack of Transportation (Non-Medical): No  Physical Activity: Inactive (07/06/2022)   Exercise Vital Sign    Days of Exercise per Week: 0 days    Minutes of Exercise per Session: 0 min  Stress: No Stress Concern Present (07/06/2022)   [employer] of Occupational Health - Occupational Stress Questionnaire    Feeling of Stress : Not at all  Social Connections: Moderately Isolated (07/06/2022)   Social Connection and Isolation Panel [NHANES]    Frequency of  Communication with Friends and Family: More than three times a week    Frequency of Social Gatherings with Friends and Family: Once a week    Attends Religious Services: More than 4 times per year    Active Member of [employer] or Organizations: No    Attends Banker Meetings: Never    Marital Status: Divorced  Catering manager Violence: Not At Risk (07/06/2022)   Humiliation, Afraid, Rape, and Kick questionnaire    Fear of Current or Ex-Partner: No    Emotionally Abused: No    Physically Abused: No    Sexually Abused: No    Allergies   Ace inhibitors, Cephalexin, Nitrofurantoin, and Penicillins  Family history:   Family History  Problem Relation Age of Onset   Hypertension Mother    Diabetes Mother    Heart failure Mother    Dementia Mother    Emphysema Father    Hypertension Father    Diabetes Brother    GER disease Brother    Hypertension Sister    Hypertension Sister    Cancer Other        family history    Diabetes Other        family history    Heart defect Other        famiily history    Arthritis Other        family history    Anesthesia problems Neg Hx    Hypotension Neg Hx    Malignant hyperthermia Neg Hx    Pseudochol deficiency Neg Hx    Colon cancer Neg Hx     Current Medications:   Prior to Admission medications   Medication Sig Start Date End Date Taking? Authorizing Provider  acetaminophen (TYLENOL) 500 MG tablet Take 2 tablets (1,000 mg total) by mouth every 6 (six) hours as needed for mild pain. 09/03/22   Vickki Hearing, MD  amLODipine (NORVASC) 10 MG tablet TAKE 1 TABLET BY MOUTH EVERY DAY 05/11/22   Kerri Perches, MD  aspirin EC 81 MG tablet Take 81 mg by mouth daily. Swallow whole.    [provider]  Continuous Blood Gluc Receiver (FREESTYLE LIBRE 2 READER) DEVI Use to test daily. Dx: E11.21 07/10/22   Kerri Perches, MD  Continuous Blood Gluc  Sensor (FREESTYLE LIBRE 2 SENSOR) MISC Use to test daily dx: E11.21  07/10/22   Kerri Perches, MD  diclofenac Sodium (VOLTAREN) 1 % GEL APPLY 4 G TOPICALLY 4 (FOUR) TIMES DAILY AS NEEDED (PAIN). 06/18/22   Vickki Hearing, MD  diphenhydramine-acetaminophen (TYLENOL PM) 25-500 MG TABS tablet Take 2 tablets by mouth at bedtime.    [provider]  Dulaglutide (TRULICITY) 0.75 MG/0.5ML SOPN INJECT 0.75 MG INTO THE SKIN WEEKLY 07/12/22   Kerri Perches, MD  Fluticasone-Umeclidin-Vilant (TRELEGY ELLIPTA) 100-62.5-25 MCG/ACT AEPB Inhale 1 puff into the lungs daily. 07/20/21   Kerri Perches, MD  gabapentin (NEURONTIN) 300 MG capsule TAKE 1 CAPSULE BY MOUTH 3 TIMES DAILY AS NEEDED. 09/03/22   Vickki Hearing, MD  glucose blood [facility] ULTRA) test strip USE AS DIRECTED 08/13/22   Kerri Perches, MD  isosorbide mononitrate (IMDUR) 30 MG 24 hr tablet Take 0.5 tablets (15 mg total) by mouth daily. 08/14/22 08/09/23  Antoine Poche, MD  nitroGLYCERIN (NITROSTAT) 0.4 MG SL tablet Place 1 tablet (0.4 mg total) under the tongue every 5 (five) minutes as needed for chest pain. Up to 3 tablets, then call 911. 07/19/21 01/27/79  Iran Ouch, Lennart Pall, PA-C  OneTouch Delica Lancets 30G MISC Once daily testing dx e11.9 01/07/20   Kerri Perches, MD  pantoprazole (PROTONIX) 40 MG tablet TAKE 1 TABLET BY MOUTH TWICE A DAY 07/12/22   Kerri Perches, MD  PARoxetine (PAXIL) 40 MG tablet TAKE 1 TABLET (40 MG TOTAL) BY MOUTH IN THE MORNING 09/03/22   Kerri Perches, MD  rosuvastatin (CRESTOR) 40 MG tablet Take 1 tablet (40 mg total) by mouth daily. 08/14/22 08/09/23  Antoine Poche, MD  SYMBICORT 80-4.5 MCG/ACT inhaler INHALE 2 PUFFS INTO THE LUNGS TWICE A DAY 05/21/22   Kerri Perches, MD  TRADJENTA 5 MG TABS tablet TAKE 1 TABLET BY MOUTH EVERY DAY 06/18/22   Kerri Perches, MD  traMADol (ULTRAM) 50 MG tablet Take 1 tablet (50 mg total) by mouth every 6 (six) hours as needed. 09/03/22   Vickki Hearing, MD  triamcinolone cream (KENALOG) 0.1 %  Apply 1 application topically 2 (two) times daily. Patient taking differently: Apply 1 application  topically 2 (two) times daily as needed. 07/28/21   Donell Beers, FNP  UNABLE TO FIND Diabetic shoes x 1 pair, inserts x 3 pair  DX E11.9 05/09/21   Kerri Perches, MD  VITAMIN D, CHOLECALCIFEROL, PO Take 1 tablet by mouth daily.    [provider]    Physical Exam:   Vitals:   09/23/22 0747 09/23/22 0800 09/23/22 1130 09/23/22 1200  BP:  (!) 134/96 (!) 133/91 126/76  Pulse:  100 94 98  Resp:  13 (!) 21 (!) 25  Temp: 98.6 F (37 C)     TempSrc: Oral     SpO2:  93% 93% 93%  Weight: 82 kg     Height: 5' 6.5" (1.689 m)        Physical Exam: Blood pressure 126/76, pulse 98, temperature 98.6 F (37 C), temperature source Oral, resp. rate (!) 25, height 5' 6.5" (1.689 m), weight 82 kg, SpO2 93 %. Gen: No acute distress. Head: Normocephalic, atraumatic. Eyes: Pupils equal, round and reactive to light. Extraocular movements intact.  Sclerae nonicteric. No lid lag. Mouth: Oropharynx reveals dry mucous membranes.  Neck: Supple, no thyromegaly, no lymphadenopathy, no jugular venous distention. Chest: Lungs are clear to auscultation with good  air movement. No rales, rhonchi or wheezes.  CV: Heart sounds are regular with an S1, S2. No murmurs, rubs, clicks, or gallops.  Abdomen: Soft, nontender, nondistended with normal active bowel sounds. No hepatosplenomegaly or palpable masses. Extremities: Extremities are without clubbing, or cyanosis. No edema. Pedal pulses 2+.  Small areas of erythema about the knees bilaterally consistent with her history of falling and scraping them. Skin: Warm and dry. No rashes, lesions or wounds. Neuro: Alert and oriented times 3; grossly nonfocal.  Psych: Insight is good and judgment is appropriate. Mood and affect depressed/flat.   Data Review:    Labs: Basic Metabolic Panel: Recent Labs  Lab 09/23/22 0759  NA 134*  K 3.5  CL 101   CO2 25  GLUCOSE 108*  BUN 15  CREATININE 1.53*  CALCIUM 9.4   Liver Function Tests: Recent Labs  Lab 09/23/22 0759  AST 18  ALT 15  ALKPHOS 71  BILITOT 0.3  PROT 8.5*  ALBUMIN 3.4*   CBC: Recent Labs  Lab 09/23/22 0759  WBC 12.8*  NEUTROABS 10.5*  HGB 11.4*  HCT 35.0*  MCV 91.6  PLT 304   Cardiac Enzymes: Recent Labs  Lab 09/23/22 0830  CKTOTAL 210    BNP (last 3 results) No results for input(s): "PROBNP" in the last 8760 hours. CBG: Recent Labs  Lab 09/23/22 0806  GLUCAP 114*    Urinalysis    Component Value Date/Time   COLORURINE YELLOW 09/23/2022 1109   APPEARANCEUR HAZY (A) 09/23/2022 1109   APPEARANCEUR Clear 01/11/2022 1408   LABSPEC 1.006 09/23/2022 1109   PHURINE 8.0 09/23/2022 1109   GLUCOSEU NEGATIVE 09/23/2022 1109   HGBUR MODERATE (A) 09/23/2022 1109   HGBUR moderate 02/07/2010 0907   BILIRUBINUR NEGATIVE 09/23/2022 1109   BILIRUBINUR Negative 01/11/2022 1408   KETONESUR NEGATIVE 09/23/2022 1109   PROTEINUR 30 (A) 09/23/2022 1109   UROBILINOGEN 0.2 09/06/2015 1401   UROBILINOGEN 0.2 03/01/2015 1600   NITRITE NEGATIVE 09/23/2022 1109   LEUKOCYTESUR LARGE (A) 09/23/2022 1109      Radiographic Studies: CT L-SPINE NO CHARGE  Result Date: 09/23/2022 CLINICAL DATA:  Fall, back pain EXAM: CT LUMBAR SPINE WITHOUT CONTRAST TECHNIQUE: Multidetector CT imaging of the lumbar spine was performed without intravenous contrast administration. Multiplanar CT image reconstructions were also generated. RADIATION DOSE REDUCTION: This exam was performed according to the departmental dose-optimization program which includes automated exposure control, adjustment of the mA and/or kV according to patient size and/or use of iterative reconstruction technique. COMPARISON:  02/13/2022 FINDINGS: Segmentation: 5 lumbar type vertebrae. Alignment: Normal. Vertebrae: No acute fracture or focal pathologic process. Paraspinal and other soft tissues: See dedicated  abdominopelvic CT report for assessment of the intra-abdominal findings. No paravertebral inflammatory changes or fluid collections. Disc levels: Intervertebral disc heights are preserved. Moderate bilateral facet arthropathy most pronounced at L3-L4 and L4-L5. Foraminal narrowing appears most pronounced at L4-L5 on the right. No evidence to suggest high-grade canal stenosis by CT. IMPRESSION: 1. No acute fracture or traumatic malalignment of the lumbar spine. 2. Moderate bilateral facet arthropathy most pronounced at L3-L4 and L4-L5. Foraminal narrowing appears most pronounced at L4-L5 on the right. Electronically Signed   By: Duanne Guess D.O.   On: 09/23/2022 13:52   CT ABDOMEN PELVIS W CONTRAST  Result Date: 09/23/2022 CLINICAL DATA:  Weakness, pyelonephritis. EXAM: CT ABDOMEN AND PELVIS WITH CONTRAST TECHNIQUE: Multidetector CT imaging of the abdomen and pelvis was performed using the standard protocol following bolus administration of intravenous contrast. RADIATION DOSE  REDUCTION: This exam was performed according to the departmental dose-optimization program which includes automated exposure control, adjustment of the mA and/or kV according to patient size and/or use of iterative reconstruction technique. CONTRAST:  80mL OMNIPAQUE IOHEXOL 300 MG/ML  SOLN COMPARISON:  February 13, 2022. FINDINGS: Lower chest: Minimal bibasilar subsegmental atelectasis. Hepatobiliary: No focal liver abnormality is seen. No gallstones, gallbladder wall thickening, or biliary dilatation. Pancreas: Unremarkable. No pancreatic ductal dilatation or surrounding inflammatory changes. Spleen: Normal in size without focal abnormality. Adrenals/Urinary Tract: Adrenal glands appear normal. Mild nonobstructive left nephrolithiasis. Minimal left hydroureteronephrosis is noted without obstructing calculus. Moderate to severe right hydroureteronephrosis is noted secondary to 6 mm calculus in midportion of right ureter. Urinary  bladder is unremarkable. Stomach/Bowel: Stomach is within normal limits. Appendix appears normal. No evidence of bowel wall thickening, distention, or inflammatory changes. Diverticulosis of descending and sigmoid colon is noted without inflammation. Vascular/Lymphatic: Aortic atherosclerosis. No enlarged abdominal or pelvic lymph nodes. Reproductive: Status post hysterectomy. No adnexal masses. Other: No abdominal wall hernia or abnormality. No abdominopelvic ascites. Musculoskeletal: No acute or significant osseous findings. IMPRESSION: Moderate to severe right hydroureteronephrosis is noted secondary to 6 mm calculus in midportion of right ureter. Nonobstructive left nephrolithiasis. Minimal left hydronephrosis is noted without obstructing calculus. Diverticulosis of descending and sigmoid colon without inflammation. Aortic Atherosclerosis (ICD10-I70.0). Electronically Signed   By: Lupita Raider M.D.   On: 09/23/2022 13:50   DG Chest Portable 1 View  Result Date: 09/23/2022 CLINICAL DATA:  Altered mental status, generalized weakness. EXAM: PORTABLE CHEST 1 VIEW COMPARISON:  Chest x-rays dated 03/21/2022 and 06/07/2021. FINDINGS: Heart size and mediastinal contours are stable. Lungs are clear. No pleural effusion or pneumothorax is seen. Chronic elevation of the RIGHT hemidiaphragm. Osseous structures about the chest are unremarkable. IMPRESSION: No active disease. No evidence of pneumonia or pulmonary edema. Electronically Signed   By: Bary Richard M.D.   On: 09/23/2022 09:09   CT Head Wo Contrast  Result Date: 09/23/2022 CLINICAL DATA:  Mental status change with unknown cause. EXAM: CT HEAD WITHOUT CONTRAST TECHNIQUE: Contiguous axial images were obtained from the base of the skull through the vertex without intravenous contrast. RADIATION DOSE REDUCTION: This exam was performed according to the departmental dose-optimization program which includes automated exposure control, adjustment of the mA  and/or kV according to patient size and/or use of iterative reconstruction technique. COMPARISON:  03/07/2016 FINDINGS: Brain: No evidence of acute infarction, hemorrhage, hydrocephalus, extra-axial collection or mass lesion/mass effect. Generalized cerebral volume loss with mild chronic small vessel ischemia. Vascular: No hyperdense vessel or unexpected calcification. Skull: Normal. Negative for fracture or focal lesion. Sinuses/Orbits: Negative IMPRESSION: No acute finding.  Stable head CT when compared to 2017. Electronically Signed   By: Tiburcio Pea M.D.   On: 09/23/2022 09:05    EKG: Independently reviewed.  Sinus tachycardia at 100 bpm.  No ischemic changes appreciated.   Assessment/Plan:   Principal Problem:   Hydronephrosis with urinary obstruction due to ureteral calculus in the setting of a history of renal calculus and complicated by UTI CT scan consistent with acute right hydroureteronephrosis with obstructive nephrolithiasis and UTI.  Urologist consulted by ED physician and plans to see [facility] tomorrow for stent placement.  In the meantime, we will place her on Rocephin to cover urinary tract infection pending urine culture results.  Suspect her weakness and fall was related to the infection and will need a physical therapy evaluation when she is a bit more stable.  Will hold  aspirin tonight in anticipation of stent placement tomorrow.  Active Problems:   Type 2 diabetes with nephropathy Managed with Trulicity, Tradjenta at home.  Will continue these medications and add sliding scale insulin, moderate scale before every meal and at bedtime.    Hyperlipidemia  Continue Crestor.    Hypertension goal BP (blood pressure) < 130/80 Continue Norvasc, isosorbide.  Of note, follows with cardiology.  Nuclear stress test in 2015 was without ischemia and an echocardiogram done in 2017 showed preserved EF of 60-65% with grade 1 diastolic dysfunction.  Repeat nuclear stress done in 01/2022 was  also negative for ischemia.  Takes nitroglycerin as needed for chest pain.  EKG is nonacute.    Sleep apnea    Anxiety and depression Continue Paxil.    Fall at home, initial encounter CT of the lumbar spine and of the head do not reveal any acute fractures.  Does have some erythema to her bilateral knees and will need a physical therapy evaluation when a bit more stable.    Multiple myeloma History of, does not appear to be on active treatment at this time.    Obesity (BMI 30.0-34.9) Body mass index is 28.74 kg/m.  Other information:   DVT prophylaxis: Lovenox ordered. Code Status: Full code. Family Communication: No family at the bedside.  Lives alone. Disposition Plan: Home when stable, likely 3-4 days. Consults called: Urology. Admission status: Inpatient.  This patient has increased complexity given her comorbidities, and has a complicated urinary tract infection requiring IV antibiotics and placement of a stent due to hydroureteronephrosis.  The medical decision making on this patient was of high complexity and the patient is at high risk for clinical deterioration, therefore this is a level 3 visit.   Trula Ore Asheton Viramontes [facility] Pager [phone]   How to contact the [facility] Attending or Consulting provider 7A - 7P or covering provider during after hours 7P -7A, for this patient?   Check the care team in [facility] and look for a) attending/consulting [facility] provider listed and b) the [facility] team listed Log into [email or web address] and use [place]'s universal password to access. If you do not have the password, please contact the hospital operator. Locate the [facility] provider you are looking for under [facility] and page to a number that you can be directly reached. If you still have difficulty reaching the provider, please page the [facility] (Director on Call) for the Hospitalists listed on amion for assistance.  09/23/2022, 3:31 PM

## 2022-09-24 ENCOUNTER — Inpatient Hospital Stay (HOSPITAL_COMMUNITY): Payer: 59 | Admitting: Anesthesiology

## 2022-09-24 ENCOUNTER — Encounter (HOSPITAL_COMMUNITY)
Admission: EM | Disposition: A | Discharge status: Home Health | Payer: Self-pay | Source: Home / Self Care | Attending: Internal Medicine

## 2022-09-24 ENCOUNTER — Encounter (HOSPITAL_COMMUNITY): Payer: Self-pay | Admitting: Internal Medicine

## 2022-09-24 ENCOUNTER — Inpatient Hospital Stay (HOSPITAL_COMMUNITY): Payer: 59

## 2022-09-24 DIAGNOSIS — Z7984 Long term (current) use of oral hypoglycemic drugs: Secondary | ICD-10-CM

## 2022-09-24 DIAGNOSIS — E1151 Type 2 diabetes mellitus with diabetic peripheral angiopathy without gangrene: Secondary | ICD-10-CM | POA: Diagnosis not present

## 2022-09-24 DIAGNOSIS — N390 Urinary tract infection, site not specified: Secondary | ICD-10-CM

## 2022-09-24 DIAGNOSIS — N132 Hydronephrosis with renal and ureteral calculous obstruction: Secondary | ICD-10-CM | POA: Diagnosis not present

## 2022-09-24 DIAGNOSIS — N201 Calculus of ureter: Secondary | ICD-10-CM | POA: Diagnosis not present

## 2022-09-24 DIAGNOSIS — I10 Essential (primary) hypertension: Secondary | ICD-10-CM | POA: Diagnosis not present

## 2022-09-24 DIAGNOSIS — Z87891 Personal history of nicotine dependence: Secondary | ICD-10-CM

## 2022-09-24 HISTORY — PX: CYSTOSCOPY W/ URETERAL STENT PLACEMENT: SHX1429

## 2022-09-24 LAB — GLUCOSE, CAPILLARY
Glucose-Capillary: 101 mg/dL — ABNORMAL HIGH (ref 70–99)
Glucose-Capillary: 104 mg/dL — ABNORMAL HIGH (ref 70–99)
Glucose-Capillary: 91 mg/dL (ref 70–99)
Glucose-Capillary: 155 mg/dL — ABNORMAL HIGH (ref 70–99)
Glucose-Capillary: 132 mg/dL — ABNORMAL HIGH (ref 70–99)

## 2022-09-24 LAB — BASIC METABOLIC PANEL
Anion gap: 8 (ref 5–15)
BUN: 11 mg/dL (ref 8–23)
CO2: 25 mmol/L (ref 22–32)
Calcium: 9.4 mg/dL (ref 8.9–10.3)
Chloride: 103 mmol/L (ref 98–111)
Creatinine, Ser: 1.3 mg/dL — ABNORMAL HIGH (ref 0.44–1.00)
GFR, Estimated: 42 mL/min — ABNORMAL LOW (ref >60)
Glucose, Bld: 94 mg/dL (ref 70–99)
Potassium: 3.3 mmol/L — ABNORMAL LOW (ref 3.5–5.1)
Sodium: 136 mmol/L (ref 135–145)

## 2022-09-24 LAB — CBC
HCT: 35.5 % — ABNORMAL LOW (ref 36.0–46.0)
Hemoglobin: 11.2 g/dL — ABNORMAL LOW (ref 12.0–15.0)
MCH: 29.3 pg (ref 26.0–34.0)
MCHC: 31.5 g/dL (ref 30.0–36.0)
MCV: 92.9 fL (ref 80.0–100.0)
Platelets: 320 10*3/uL (ref 150–400)
RBC: 3.82 MIL/uL — ABNORMAL LOW (ref 3.87–5.11)
RDW: 17 % — ABNORMAL HIGH (ref 11.5–15.5)
WBC: 11.3 10*3/uL — ABNORMAL HIGH (ref 4.0–10.5)
nRBC: 0 % (ref 0.0–0.2)

## 2022-09-24 LAB — CULTURE, BLOOD (ROUTINE X 2)

## 2022-09-24 SURGERY — CYSTOSCOPY, WITH RETROGRADE PYELOGRAM AND URETERAL STENT INSERTION
Anesthesia: General | Site: Ureter | Laterality: Right

## 2022-09-24 MED ORDER — PROPOFOL 10 MG/ML IV BOLUS
INTRAVENOUS | Status: DC | PRN
  Administered 2022-09-24: 160 mg via INTRAVENOUS
  Administered 2022-09-24: 40 mg via INTRAVENOUS

## 2022-09-24 MED ORDER — SODIUM CHLORIDE 0.9 % IV SOLN
1.0000 g | INTRAVENOUS | Status: DC
  Administered 2022-09-24 – 2022-09-27 (×4): 1 g via INTRAVENOUS
  Filled 2022-09-24 (×4): qty 10

## 2022-09-24 MED ORDER — ALBUTEROL SULFATE HFA 108 (90 BASE) MCG/ACT IN AERS
INHALATION_SPRAY | RESPIRATORY_TRACT | Status: DC | PRN
  Administered 2022-09-24: 4 via RESPIRATORY_TRACT

## 2022-09-24 MED ORDER — METOCLOPRAMIDE HCL 5 MG/ML IJ SOLN
10.0000 mg | Freq: Once | INTRAMUSCULAR | Status: AC
  Administered 2022-09-24: 10 mg via INTRAVENOUS

## 2022-09-24 MED ORDER — LACTATED RINGERS IV SOLN: INTRAVENOUS | Status: DC

## 2022-09-24 MED ORDER — LIDOCAINE HCL (CARDIAC) PF 100 MG/5ML IV SOSY
PREFILLED_SYRINGE | INTRAVENOUS | Status: DC | PRN
  Administered 2022-09-24: 60 mg via INTRATRACHEAL

## 2022-09-24 MED ORDER — IPRATROPIUM-ALBUTEROL 0.5-2.5 (3) MG/3ML IN SOLN
3.0000 mL | Freq: Once | RESPIRATORY_TRACT | Status: AC
  Administered 2022-09-24: 3 mL via RESPIRATORY_TRACT
  Filled 2022-09-24: qty 3

## 2022-09-24 MED ORDER — PHENYLEPHRINE 80 MCG/ML (10ML) SYRINGE FOR IV PUSH (FOR BLOOD PRESSURE SUPPORT)
PREFILLED_SYRINGE | INTRAVENOUS | Status: AC
  Filled 2022-09-24: qty 10

## 2022-09-24 MED ORDER — PROPOFOL 10 MG/ML IV BOLUS
INTRAVENOUS | Status: AC
  Filled 2022-09-24: qty 20

## 2022-09-24 MED ORDER — HYDROMORPHONE HCL 1 MG/ML IJ SOLN: 0.2500 mg | INTRAMUSCULAR | Status: DC | PRN

## 2022-09-24 MED ORDER — ROCURONIUM BROMIDE 10 MG/ML (PF) SYRINGE
PREFILLED_SYRINGE | INTRAVENOUS | Status: AC
  Filled 2022-09-24: qty 20

## 2022-09-24 MED ORDER — SODIUM CHLORIDE 0.9 % IR SOLN
Status: DC | PRN
  Administered 2022-09-24: 3000 mL

## 2022-09-24 MED ORDER — ROCURONIUM BROMIDE 100 MG/10ML IV SOLN
INTRAVENOUS | Status: DC | PRN
  Administered 2022-09-24: 20 mg via INTRAVENOUS

## 2022-09-24 MED ORDER — SUCCINYLCHOLINE CHLORIDE 200 MG/10ML IV SOSY
PREFILLED_SYRINGE | INTRAVENOUS | Status: AC
  Filled 2022-09-24: qty 10

## 2022-09-24 MED ORDER — DIATRIZOATE MEGLUMINE 30 % UR SOLN
URETHRAL | Status: AC
  Filled 2022-09-24: qty 100

## 2022-09-24 MED ORDER — ONDANSETRON HCL 4 MG/2ML IJ SOLN
INTRAMUSCULAR | Status: DC | PRN
  Administered 2022-09-24: 4 mg via INTRAVENOUS

## 2022-09-24 MED ORDER — SUGAMMADEX SODIUM 200 MG/2ML IV SOLN
INTRAVENOUS | Status: DC | PRN
  Administered 2022-09-24: 200 mg via INTRAVENOUS

## 2022-09-24 MED ORDER — DIATRIZOATE MEGLUMINE 30 % UR SOLN
URETHRAL | Status: DC | PRN
  Administered 2022-09-24: 7 mL via URETHRAL

## 2022-09-24 MED ORDER — METOCLOPRAMIDE HCL 5 MG/ML IJ SOLN
INTRAMUSCULAR | Status: AC
  Filled 2022-09-24: qty 2

## 2022-09-24 MED ORDER — STERILE WATER FOR IRRIGATION IR SOLN
Status: DC | PRN
  Administered 2022-09-24: 1000 mL

## 2022-09-24 MED ORDER — FENTANYL CITRATE (PF) 100 MCG/2ML IJ SOLN
INTRAMUSCULAR | Status: AC
  Filled 2022-09-24: qty 2

## 2022-09-24 MED ORDER — ALBUTEROL SULFATE HFA 108 (90 BASE) MCG/ACT IN AERS
INHALATION_SPRAY | RESPIRATORY_TRACT | Status: AC
  Filled 2022-09-24: qty 6.7

## 2022-09-24 MED ORDER — DEXAMETHASONE SODIUM PHOSPHATE 10 MG/ML IJ SOLN
INTRAMUSCULAR | Status: DC | PRN
  Administered 2022-09-24: 10 mg via INTRAVENOUS

## 2022-09-24 MED ORDER — FENTANYL CITRATE (PF) 100 MCG/2ML IJ SOLN
INTRAMUSCULAR | Status: DC | PRN
  Administered 2022-09-24 (×2): 50 ug via INTRAVENOUS

## 2022-09-24 MED ORDER — ORAL CARE MOUTH RINSE: 15.0000 mL | Freq: Once | OROMUCOSAL | Status: AC

## 2022-09-24 MED ORDER — SUCCINYLCHOLINE CHLORIDE 200 MG/10ML IV SOSY
PREFILLED_SYRINGE | INTRAVENOUS | Status: DC | PRN
  Administered 2022-09-24: 140 mg via INTRAVENOUS

## 2022-09-24 MED ORDER — CHLORHEXIDINE GLUCONATE 0.12 % MT SOLN
15.0000 mL | Freq: Once | OROMUCOSAL | Status: AC
  Administered 2022-09-24: 15 mL via OROMUCOSAL

## 2022-09-24 SURGICAL SUPPLY — 20 items
BAG DRAIN URO TABLE W/ADPT NS (BAG) ×2 IMPLANT
BAG DRN 8 ADPR NS SKTRN CSTL (BAG) ×1
BAG HAMPER (MISCELLANEOUS) ×2 IMPLANT
CATH INTERMIT  6FR 70CM (CATHETERS) ×2 IMPLANT
CLOTH BEACON ORANGE TIMEOUT ST (SAFETY) ×2 IMPLANT
DECANTER SPIKE VIAL GLASS SM (MISCELLANEOUS) ×2 IMPLANT
GLOVE BIO SURGEON STRL SZ8 (GLOVE) ×2 IMPLANT
GLOVE BIOGEL PI IND STRL 7.0 (GLOVE) ×4 IMPLANT
GOWN STRL REUS W/TWL LRG LVL3 (GOWN DISPOSABLE) ×2 IMPLANT
GOWN STRL REUS W/TWL XL LVL3 (GOWN DISPOSABLE) ×2 IMPLANT
GUIDEWIRE STR ZIPWIRE 035X150 (MISCELLANEOUS) ×2 IMPLANT
IV NS IRRIG 3000ML ARTHROMATIC (IV SOLUTION) ×2 IMPLANT
KIT TURNOVER CYSTO (KITS) ×2 IMPLANT
MANIFOLD NEPTUNE II (INSTRUMENTS) ×2 IMPLANT
PACK CYSTO (CUSTOM PROCEDURE TRAY) ×2 IMPLANT
PAD ARMBOARD 7.5X6 YLW CONV (MISCELLANEOUS) ×2 IMPLANT
STENT URET 6FRX26 CONTOUR (STENTS) IMPLANT
SYR 10ML LL (SYRINGE) ×2 IMPLANT
TOWEL OR 17X26 4PK STRL BLUE (TOWEL DISPOSABLE) ×2 IMPLANT
WATER STERILE IRR 500ML POUR (IV SOLUTION) ×2 IMPLANT

## 2022-09-24 NOTE — Anesthesia Postprocedure Evaluation (Signed)
Anesthesia Post Note  Patient: Cheryl Abbott  Procedure(s) Performed: CYSTOSCOPY WITH RETROGRADE PYELOGRAM/URETERAL STENT PLACEMENT (Right: Ureter)  Patient location during evaluation: Phase II Anesthesia Type: General Level of consciousness: awake and alert and oriented Pain management: pain level controlled Vital Signs Assessment: post-procedure vital signs reviewed and stable Respiratory status: spontaneous breathing, nonlabored ventilation and respiratory function stable Cardiovascular status: blood pressure returned to baseline and stable Postop Assessment: no apparent nausea or vomiting Anesthetic complications: no  No notable events documented.   Last Vitals:  Vitals:   09/24/22 1615 09/24/22 1630  BP: 136/77 123/83  Pulse: (!) 103 (!) 105  Resp: 16 13  Temp: 36.8 C   SpO2: 94% 95%    Last Pain:  Vitals:   09/24/22 1615  TempSrc:   PainSc: 0-No pain                 Talvin Christianson C Isiaih Hollenbach

## 2022-09-24 NOTE — Progress Notes (Signed)
Patient slept during the night. Patient is alert and oriented x3, with episodes of disorientation.  Patient is easily reoriented. IV access restated by Wallace Cullens, charge RN. Prn medications given twice during this shift.

## 2022-09-24 NOTE — Transfer of Care (Signed)
Immediate Anesthesia Transfer of Care Note  Patient: Cheryl Abbott  Procedure(s) Performed: CYSTOSCOPY WITH RETROGRADE PYELOGRAM/URETERAL STENT PLACEMENT (Right: Ureter)  Patient Location: PACU  Anesthesia Type:General  Level of Consciousness: confused  Airway & Oxygen Therapy: Patient Spontanous Breathing and Patient connected to face mask oxygen  Post-op Assessment: Report given to RN and Post -op Vital signs reviewed and stable  Post vital signs: Reviewed and stable  Last Vitals:  Vitals Value Taken Time  BP 136/77 09/24/22 1615  Temp 98.2   Pulse 109 09/24/22 1623  Resp 30 09/24/22 1623  SpO2 92 % 09/24/22 1623  Vitals shown include unvalidated device data.  Last Pain:  Vitals:   09/24/22 1352  TempSrc: Oral  PainSc: 0-No pain         Complications: No notable events documented.

## 2022-09-24 NOTE — Progress Notes (Signed)
Mobility Specialist Progress Note:   09/24/22 1205  Mobility  Activity Ambulated with assistance in room  Level of Assistance Contact guard assist, steadying assist  Assistive Device Front wheel walker  Distance Ambulated (ft) 20 ft  Activity Response Tolerated well  Mobility Referral Yes  $Mobility charge 1 Mobility   Pt agreeable to mobility session. Received sitting EOB, eager for surgery. Ambulated in room via RW, required MinA to stand, CGA during ambulation for safety. Tolerated well,  c/o weakness d/t NPO. Returned pt to EOB, all needs met.   Feliciana Rossetti Mobility Specialist Please contact via Special educational needs teacher or  Rehab office at [phone]

## 2022-09-24 NOTE — Anesthesia Procedure Notes (Signed)
Procedure Name: Intubation Date/Time: 09/24/2022 3:38 PM  Performed by: Lorin Glass, CRNAPre-anesthesia Checklist: Patient identified, Emergency Drugs available, Suction available and Patient being monitored Patient Re-evaluated:Patient Re-evaluated prior to induction Oxygen Delivery Method: Circle system utilized Preoxygenation: Pre-oxygenation with 100% oxygen Induction Type: IV induction, Cricoid Pressure applied and Rapid sequence Laryngoscope Size: Mac and 3 Grade View: Grade I Tube type: Oral Tube size: 7.0 mm Number of attempts: 1 Airway Equipment and Method: Stylet Placement Confirmation: ETT inserted through vocal cords under direct vision, positive ETCO2 and breath sounds checked- equal and bilateral Secured at: 21 cm Tube secured with: Tape Dental Injury: Teeth and Oropharynx as per pre-operative assessment

## 2022-09-24 NOTE — Anesthesia Preprocedure Evaluation (Addendum)
Anesthesia Evaluation  Patient identified by MRN, date of birth, ID band Patient awake    Reviewed: Allergy & Precautions, H&P , NPO status , Patient's Chart, lab work & pertinent test results  Airway Mallampati: II  TM Distance: >3 FB Neck ROM: Full    Dental  (+) Dental Advisory Given, Missing   Pulmonary sleep apnea , former smoker   Pulmonary exam normal breath sounds clear to auscultation       Cardiovascular hypertension, Pt. on medications + Peripheral Vascular Disease  Normal cardiovascular exam Rhythm:Regular Rate:Normal     Neuro/Psych  Headaches PSYCHIATRIC DISORDERS Anxiety Depression     Neuromuscular disease    GI/Hepatic Neg liver ROS,GERD  Medicated and Poorly Controlled,,  Endo/Other  diabetes (took trulicity on 09/20/22), Well Controlled, Type 2, Oral Hypoglycemic Agents    Renal/GU Renal InsufficiencyRenal disease (stones)  negative genitourinary   Musculoskeletal  (+) Arthritis , Osteoarthritis,    Abdominal   Peds negative pediatric ROS (+)  Hematology  (+) Blood dyscrasia, anemia   Anesthesia Other Findings   Reproductive/Obstetrics negative OB ROS                             Anesthesia Physical Anesthesia Plan  ASA: 3  Anesthesia Plan: General   Post-op Pain Management: Dilaudid IV   Induction: Intravenous and Rapid sequence  PONV Risk Score and Plan: 4 or greater and Ondansetron and Dexamethasone  Airway Management Planned: Oral ETT  Additional Equipment:   Intra-op Plan:   Post-operative Plan: Extubation in OR  Informed Consent: I have reviewed the patients History and Physical, chart, labs and discussed the procedure including the risks, benefits and alternatives for the proposed anesthesia with the patient or authorized representative who has indicated his/her understanding and acceptance.     Dental advisory given  Plan Discussed with: CRNA and  Surgeon  Anesthesia Plan Comments:         Anesthesia Quick Evaluation

## 2022-09-24 NOTE — TOC Progression Note (Signed)
Transition of Care [facility]) - Progression Note    Patient Details  Name: SEAN DEFREECE MRN: [id number] Date of Birth: April 15, 1946  Transition of Care [facility]) CM/SW Contact  Karn Cassis, [place] Phone Number: 09/24/2022, 10:17 AM  Clinical Narrative:   Transition of Care [facility]) Screening Note   Patient Details  Name: VERBLE PEEKS Date of Birth: 1945/07/01   Transition of Care [facility]) CM/SW Contact:    Karn Cassis, LCSW Phone Number: 09/24/2022, 10:17 AM    Transition of Care Department [facility]) has reviewed patient and no TOC needs have been identified at this time. We will continue to monitor patient advancement through interdisciplinary progression rounds. If new patient transition needs arise, please place a TOC consult.         Barriers to Discharge: Continued Medical Work up  Expected Discharge Plan and Services                                               Social Determinants of Health (SDOH) Interventions SDOH Screenings   Food Insecurity: No Food Insecurity (07/06/2022)  Housing: Low Risk  (07/06/2022)  Transportation Needs: No Transportation Needs (07/06/2022)  Utilities: Not At Risk (07/06/2022)  Alcohol Screen: Low Risk  (07/06/2022)  Depression (PHQ2-9): Medium Risk (07/10/2022)  Financial Resource Strain: Low Risk  (07/06/2022)  Physical Activity: Inactive (07/06/2022)  Social Connections: Moderately Isolated (07/06/2022)  Stress: No Stress Concern Present (07/06/2022)  Tobacco Use: Medium Risk (09/23/2022)    Readmission Risk Interventions     No data to display

## 2022-09-24 NOTE — Op Note (Signed)
.  Preoperative diagnosis: right ureteral stone, UTI  Postoperative diagnosis: Same  Procedure: 1 cystoscopy 2. right retrograde pyelography 3.  Intraoperative fluoroscopy, under one hour, with interpretation 4. right 6 x 26 JJ stent placement  Attending: Wilkie Aye  Anesthesia: General  Estimated blood loss: None  Drains: Right 6 x 26 JJ ureteral stent without tether, 16 French foley catheter  Specimens: none  Antibiotics: rocephin  Findings: right mid ureteral stone. Moderate hydronephrosis. No masses/lesions in the bladder. Ureteral orifices in normal anatomic location.  Indications: Patient is a 77 year old female with a history of right ureteral stone and UTI.  After discussing treatment options, they decided proceed with right stent placement.  Procedure in detail: The patient was brought to the operating room and a brief timeout was done to ensure correct patient, correct procedure, correct site.  General anesthesia was administered patient was placed in dorsal lithotomy position.  Their genitalia was then prepped and draped in usual sterile fashion.  A rigid 22 French cystoscope was passed in the urethra and the bladder.  Bladder was inspected free masses or lesions.  the ureteral orifices were in the normal orthotopic locations.  a 6 french ureteral catheter was then instilled into the right ureteral orifice.  a gentle retrograde was obtained and findings noted above.  we then placed a zip wire through the ureteral catheter and advanced up to the renal pelvis.    We then placed a 6 x 26 double-j ureteral stent over the original zip wire.  We then removed the wire and good coil was noted in the the renal pelvis under fluoroscopy and the bladder under direct vision.  A foley catheter was then placed. the bladder was then drained and this concluded the procedure which was well tolerated by patient.  Complications: None  Condition: Stable, extubated, transferred to PACU  Plan:  Patient is to be admitted for IV antibiotics. She will have his stone extraction in 2 weeks.

## 2022-09-24 NOTE — Consult Note (Signed)
Urology Consult  Referring physician: Dr. Particia Nearing Reason for referral: right ureteral calculus, UTI  Chief Complaint: right flank pain  History of Present Illness: Cheryl Abbott is a 77yo with a history of nephrolithiasis who presented to the ER yesterday with a 5 day history of worsening right flank pain. She had been having intermittent right flank pain for several months but 5 days ago the pain became more severe. The pain yesterday was sharp, constant severe and nonradiatinting. She had nausea but no vomiting. She denies any urinary urgency, frequency or dysuria. UA shows rare bacteria. No fevers since hospitalization but she believed she had a fever at home yesterday. CT obtained in the ER shows a 12mm mid ureteral calculus with moderate to severe hydronephrosis.   Past Medical History:  Diagnosis Date   Anxiety disorder    Arthritis    Chronic back pain    Chronic pain of both shoulders    per pt told due to cervical spine pinched nerve   CKD (chronic kidney disease), stage III    nephrologist--- dr Wolfgang Phoenix;   due to tubular necrosis   Claustrophobia    Depression    Edema of both lower extremities    GERD (gastroesophageal reflux disease)    Headache    Hemorrhoids    w/ intermittant bleeding   History of adenomatous polyp of colon    History of kidney stones    Hx-TIA (transient ischemic attack) 02/26/2016   per pt no residual   Hydronephrosis, left    followed by urologist--- dr Retta Diones;   persistant   Hyperlipidemia    Hypertension    Iron deficiency    followed by [facility] w/ hx iron infusions   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   oncologist--- [facility];  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, active survillence   Lactose intolerance in adult 02/01/2016   Left ureteral calculus    Lumbar disc disease with radiculopathy    Nephrolithiasis    per CT 08-11-2021  left > right   Neuropathy, peripheral    Nocturia more than twice per night     Normocytic anemia    OSA (obstructive sleep apnea)    per study 2007;   per pt no Bipap use since 2021   Renal atrophy, bilateral    Renal cyst, right    Sciatica    Sigmoid diverticulosis    Type 2 diabetes mellitus    Past Surgical History:  Procedure Laterality Date   BIOPSY N/A 03/17/2013   Procedure: GASTRIC BIOPSIES;  Surgeon: West Bali, MD;  [facility]: [facility];  Service: Endoscopy;  Laterality: N/A;   BIOPSY  06/10/2015   Procedure: BIOPSY;  Surgeon: West Bali, MD;  [facility];  Service: Endoscopy;;  gastric biopsy   CARDIAC CATHETERIZATION  05/11/2003   @ [facility] by dr t. Tresa Endo;  Abnormal cardiolite/   Normal coronary arteries and normal LVF,  ef  63%   CARDIOVASCULAR STRESS TEST  01/01/2014   normal lexiscan cardiolite/  no ischemia/ infarct/  normal LV function and wall motion ,  ef 81%   COLONOSCOPY WITH PROPOFOL N/A 02/26/2017   Procedure: COLONOSCOPY WITH PROPOFOL;  Surgeon: West Bali, MD;  [facility];  Service: Endoscopy;  Laterality: N/A;  11:00am   COLONOSCOPY WITH PROPOFOL N/A 09/22/2019   Procedure: COLONOSCOPY WITH PROPOFOL;  Surgeon: West Bali, MD;  [facility];  Service: Endoscopy;  Laterality: N/A;  1:15   CYSTO/   RIGHT URETEROSOCOPY LASER LITHOTRIPSY STONE EXTRACTION  10/13/2004   CYSTO/  RIGHT RETROGRADE PYELOGRAM/  PLACMENT RIGHT URETERAL STENT  01/10/2010   CYSTOSCOPY W/ URETERAL STENT PLACEMENT Right 08/19/2015   Procedure: CYSTOSCOPY WITH RETROGRADE PYELOGRAM/URETERAL STENT PLACEMENT;  Surgeon: Marcine Matar, MD;  [facility];  Service: Urology;  Laterality: Right;   CYSTOSCOPY W/ URETERAL STENT PLACEMENT Left 02/23/2020   Procedure: CYSTOSCOPY LEFT  RETROGRADE PYELOGRAM LEFT URETEROSCOPY URETERAL STENT PLACEMENT;  Surgeon: Marcine Matar, MD;  [facility];  Service: Urology;  Laterality: Left;   CYSTOSCOPY WITH RETROGRADE PYELOGRAM, URETEROSCOPY AND STENT  PLACEMENT Left 10/21/2014   Procedure: CYSTOSCOPY WITH RETROGRADE PYELOGRAM, URETEROSCOPY AND STENT PLACEMENT;  Surgeon: Marcine Matar, MD;  Location: [facility];  Service: Urology;  Laterality: Left;   CYSTOSCOPY WITH RETROGRADE PYELOGRAM, URETEROSCOPY AND STENT PLACEMENT Right 11/22/2015   Procedure: CYSTOSCOPY, RIGHT URETERAL STENT REMOVAL; RIGHT RETROGRADE PYELOGRAM, RIGHT URETEROSCOPY WITH BALLOON DILATION; RIGHT URETERAL STENT PLACEMENT;  Surgeon: Marcine Matar, MD;  [facility];  Service: Urology;  Laterality: Right;   CYSTOSCOPY WITH RETROGRADE PYELOGRAM, URETEROSCOPY AND STENT PLACEMENT Left 10/18/2020   Procedure: CYSTOSCOPY WITH LEFT RETROGRADE PYELOGRAM, LEFT URETEROSCOPY  WITH LASER AND LEFT URETERAL STENT PLACEMENT;  Surgeon: Marcine Matar, MD;  [facility];  Service: Urology;  Laterality: Left;   CYSTOSCOPY WITH RETROGRADE PYELOGRAM, URETEROSCOPY AND STENT PLACEMENT Left 10/26/2021   Procedure: CYSTOSCOPY WITH RETROGRADE PYELOGRAM, URETEROSCOPY, STONE EXTRACTION  AND STENT PLACEMENT;  Surgeon: Marcine Matar, MD;  Location[facility];  Service: Urology;  Laterality: Left;   CYSTOSCOPY/RETROGRADE/URETEROSCOPY/STONE EXTRACTION WITH BASKET Bilateral 08/02/2015   Procedure: CYSTOSCOPY; BILATERAL RETROGRADE PYELOGRAMS; BILATERAL URETEROSCOPY, STONE EXTRACTION WITH BASKET;  Surgeon: Marcine Matar, MD;  [facility];  Service: Urology;  Laterality: Bilateral;   CYSTOSCOPY/RETROGRADE/URETEROSCOPY/STONE EXTRACTION WITH BASKET Right 06/28/2015   Procedure: CYSTOSCOPY/RETROGRADE/URETEROSCOPY/STONE EXTRACTION WITH BASKET, RIGHT URETERAL DOUBLE J STENT PLACEMENT;  Surgeon: Marcine Matar, MD;  [facility];  Service: Urology;  Laterality: Right;   ESOPHAGOGASTRODUODENOSCOPY (EGD) WITH PROPOFOL N/A 03/17/2013   Procedure: ESOPHAGOGASTRODUODENOSCOPY (EGD) WITH PROPOFOL;  Surgeon: West Bali, MD;  [facility]: [facility];  Service:  Endoscopy;  Laterality: N/A;   ESOPHAGOGASTRODUODENOSCOPY (EGD) WITH PROPOFOL N/A 06/10/2015   Distal gastritis, distal esophageal stricture s/p dilation   ESOPHAGOGASTRODUODENOSCOPY (EGD) WITH PROPOFOL N/A 02/26/2017   Procedure: ESOPHAGOGASTRODUODENOSCOPY (EGD) WITH PROPOFOL;  Surgeon: West Bali, MD;  [facility];  Service: Endoscopy;  Laterality: N/A;   FLEXIBLE SIGMOIDOSCOPY  09/11/2011   [id number] Internal hemorrhoids   HOLMIUM LASER APPLICATION Left 10/21/2014   Procedure: HOLMIUM LASER APPLICATION;  Surgeon: Marcine Matar, MD;  Location: [facility];  Service: Urology;  Laterality: Left;   HOLMIUM LASER APPLICATION Right 06/28/2015   Procedure: HOLMIUM LASER APPLICATION;  Surgeon: Marcine Matar, MD;  [facility];  Service: Urology;  Laterality: Right;   HOLMIUM LASER APPLICATION  02/23/2020   Procedure: HOLMIUM LASER APPLICATION;  Surgeon: Marcine Matar, MD;  Location: [facility];  Service: Urology;;   HOLMIUM LASER APPLICATION Left 10/18/2020   Procedure: HOLMIUM LASER APPLICATION;  Surgeon: Marcine Matar, MD;  [facility];  Service: Urology;  Laterality: Left;   MASS EXCISION Left 03/04/2015   Procedure: EXCISION OF SOFT TISSUE NEOPLASM LEFT ARM;  Surgeon: Franky Macho, MD;  Location: [facility];  Service: General;  Laterality: Left;   PERCUTANEOUS NEPHROSTOLITHOTOMY Bilateral 05/2010   @[facility]    POLYPECTOMY N/A 03/17/2013   Procedure:  GASTRIC POLYPECTOMY;  Surgeon: West Bali, MD;  [facility];  Service: Endoscopy;  Laterality: N/A;   POLYPECTOMY  02/26/2017   Procedure: POLYPECTOMY;  Surgeon: West Bali, MD;  [facility];  Service: Endoscopy;;  polyp at cecum, ascending colon polyps x3, hepatic flexure polyps x8, transverse colon polyps x8    POLYPECTOMY  09/22/2019   Procedure: POLYPECTOMY;  Surgeon: West Bali, MD;  [facility];  Service: Endoscopy;;   REMOVAL RIGHT THIGH CYST   2006   SAVORY DILATION N/A 03/17/2013   Procedure: SAVORY DILATION;  Surgeon: West Bali, MD;  Location: [facility];  Service: Endoscopy;  Laterality: N/A;  #12.8, 14, 15, 16 dilators used   SAVORY DILATION N/A 06/10/2015   Procedure: SAVORY DILATION;  Surgeon: West Bali, MD;  [facility];  Service: Endoscopy;  Laterality: N/A;   VAGINAL HYSTERECTOMY  1970's    Medications: I have reviewed the patient's current medications. Allergies:  Allergies  Allergen Reactions   Ace Inhibitors Cough    Patient doesn't recall  Other Reaction(s): GI Intolerance   Cephalexin Diarrhea and Nausea And Vomiting    Other Reaction(s): GI Intolerance   Nitrofurantoin Diarrhea and Nausea And Vomiting   Penicillins Hives and Other (See Comments)    Family History  Problem Relation Age of Onset   Hypertension Mother    Diabetes Mother    Heart failure Mother    Dementia Mother    Emphysema Father    Hypertension Father    Diabetes Brother    GER disease Brother    Hypertension Sister    Hypertension Sister    Cancer Other        family history    Diabetes Other        family history    Heart defect Other        famiily history    Arthritis Other        family history    Anesthesia problems Neg Hx    Hypotension Neg Hx    Malignant hyperthermia Neg Hx    Pseudochol deficiency Neg Hx    Colon cancer Neg Hx    Social History:  reports that she quit smoking about 34 years ago. Her smoking use included cigarettes. She started smoking about 48 years ago. She has a 20.00 pack-year smoking history. She has been exposed to tobacco smoke. She has never used smokeless tobacco. She reports that she does not drink alcohol and does not use drugs.  Review of Systems  Genitourinary:  Positive for flank pain.  All other systems reviewed and are negative.   Physical Exam:  Vital signs in last 24 hours: Temp:  [98.5 F (36.9 C)-98.6 F (37 C)] 98.6 F (37 C) (04/15 0342) Pulse Rate:   [93-103] 93 (04/15 0342) Resp:  [17-21] 19 (04/15 0342) BP: (122-132)/(66-79) 124/79 (04/15 0342) SpO2:  [96 %] 96 % (04/15 0342) FiO2 (%):  [0 %] 0 % (04/14 1953) Physical Exam Vitals reviewed.  Constitutional:      Appearance: Normal appearance.  HENT:     Head: Normocephalic and atraumatic.     Nose: Nose normal. No congestion.     Mouth/Throat:     Mouth: Mucous membranes are dry.  Eyes:     Extraocular Movements: Extraocular movements intact.     Pupils: Pupils are equal, round, and reactive to light.  Cardiovascular:     Rate and Rhythm: Normal rate and regular  rhythm.  Pulmonary:     Effort: Pulmonary effort is normal. No respiratory distress.  Abdominal:     General: Abdomen is flat. There is no distension.  Musculoskeletal:        General: No swelling. Normal range of motion.     Cervical back: Normal range of motion and neck supple.  Skin:    General: Skin is warm and dry.  Neurological:     General: No focal deficit present.     Mental Status: She is alert and oriented to person, place, and time.  Psychiatric:        Mood and Affect: Mood normal.        Behavior: Behavior normal.        Thought Content: Thought content normal.        Judgment: Judgment normal.     Laboratory Data:  Results for orders placed or performed during the hospital encounter of 09/23/22 (from the past 72 hour(s))  CBC with Differential     Status: Abnormal   Collection Time: 09/23/22  7:59 AM  Result Value Ref Range   WBC 12.8 (H) 4.0 - 10.5 K/uL   RBC 3.82 (L) 3.87 - 5.11 MIL/uL   Hemoglobin 11.4 (L) 12.0 - 15.0 g/dL   HCT 16.1 (L) 09.6 - 04.5 %   MCV 91.6 80.0 - 100.0 fL   MCH 29.8 26.0 - 34.0 pg   MCHC 32.6 30.0 - 36.0 g/dL   RDW 40.9 (H) 81.1 - 91.4 %   Platelets 304 150 - 400 K/uL   nRBC 0.0 0.0 - 0.2 %   Neutrophils Relative % 82 %   Neutro Abs 10.5 (H) 1.7 - 7.7 K/uL   Lymphocytes Relative 6 %   Lymphs Abs 0.8 0.7 - 4.0 K/uL   Monocytes Relative 10 %   Monocytes  Absolute 1.3 (H) 0.1 - 1.0 K/uL   Eosinophils Relative 1 %   Eosinophils Absolute 0.1 0.0 - 0.5 K/uL   Basophils Relative 0 %   Basophils Absolute 0.0 0.0 - 0.1 K/uL   Immature Granulocytes 1 %   Abs Immature Granulocytes 0.06 0.00 - 0.07 K/uL    Comment: Performed at [facility], [address] [place], [place] [address]  Comprehensive metabolic panel     Status: Abnormal   Collection Time: 09/23/22  7:59 AM  Result Value Ref Range   Sodium 134 (L) 135 - 145 mmol/L   Potassium 3.5 3.5 - 5.1 mmol/L   Chloride 101 98 - 111 mmol/L   CO2 25 22 - 32 mmol/L   Glucose, Bld 108 (H) 70 - 99 mg/dL    Comment: Glucose reference range applies only to samples taken after fasting for at least 8 hours.   BUN 15 8 - 23 mg/dL   Creatinine, Ser 6.21 (H) 0.44 - 1.00 mg/dL   Calcium 9.4 8.9 - 30.8 mg/dL   Total Protein 8.5 (H) 6.5 - 8.1 g/dL   Albumin 3.4 (L) 3.5 - 5.0 g/dL   AST 18 15 - 41 U/L   ALT 15 0 - 44 U/L   Alkaline Phosphatase 71 38 - 126 U/L   Total Bilirubin 0.3 0.3 - 1.2 mg/dL   GFR, Estimated 35 (L) >60 mL/min    Comment: (NOTE) Calculated using the CKD-EPI Creatinine Equation (2021)    Anion gap 8 5 - 15    Comment: Performed at [facility], [address] [place], [place] [address]  SARS Coronavirus 2 by RT PCR (hospital  order, performed in [facility] hospital lab) *cepheid single result test* Anterior Nasal Swab     Status: None   Collection Time: 09/23/22  8:03 AM   Specimen: Anterior Nasal Swab  Result Value Ref Range   SARS Coronavirus 2 by RT PCR NEGATIVE NEGATIVE    Comment: (NOTE) SARS-CoV-2 target nucleic acids are NOT DETECTED.  The SARS-CoV-2 RNA is generally detectable in upper and lower respiratory specimens during the acute phase of infection. The lowest concentration of SARS-CoV-2 viral copies this assay can detect is 250 copies / mL. A negative result does not preclude SARS-CoV-2 infection and should not be used as the sole basis for treatment or  other patient management decisions.  A negative result may occur with improper specimen collection / handling, submission of specimen other than nasopharyngeal swab, presence of viral mutation(s) within the areas targeted by this assay, and inadequate number of viral copies (<250 copies / mL). A negative result must be combined with clinical observations, patient history, and epidemiological information.  Fact Sheet for Patients:   RoadLapTop.co.za  Fact Sheet for Healthcare Providers: http://kim-miller.com/  This test is not yet approved or  cleared by the [place] FDA and has been authorized for detection and/or diagnosis of SARS-CoV-2 by FDA under an Emergency Use Authorization (EUA).  This EUA will remain in effect (meaning this test can be used) for the duration of the COVID-19 declaration under Section 564(b)(1) of the Act, 21 U.S.C. section 360bbb-3(b)(1), unless the authorization is terminated or revoked sooner.  Performed at [facility], [address], [place], [place] [address]   CBG monitoring, ED     Status: Abnormal   Collection Time: 09/23/22  8:06 AM  Result Value Ref Range   Glucose-Capillary 114 (H) 70 - 99 mg/dL    Comment: Glucose reference range applies only to samples taken after fasting for at least 8 hours.  CK     Status: None   Collection Time: 09/23/22  8:30 AM  Result Value Ref Range   Total CK 210 38 - 234 U/L    Comment: Performed at [facility], [address] [place], [place] [address]  Lactic acid, plasma     Status: None   Collection Time: 09/23/22  9:09 AM  Result Value Ref Range   Lactic Acid, Venous 1.0 0.5 - 1.9 mmol/L    Comment: Performed at [facility], [address], [place], [place] [address]  Urinalysis, Routine w reflex microscopic -Urine, Clean Catch     Status: Abnormal   Collection Time: 09/23/22 11:09 AM  Result Value Ref Range   Color, Urine YELLOW YELLOW   APPearance HAZY  (A) CLEAR   Specific Gravity, Urine 1.006 1.005 - 1.030   pH 8.0 5.0 - 8.0   Glucose, UA NEGATIVE NEGATIVE mg/dL   Hgb urine dipstick MODERATE (A) NEGATIVE   Bilirubin Urine NEGATIVE NEGATIVE   Ketones, ur NEGATIVE NEGATIVE mg/dL   Protein, ur 30 (A) NEGATIVE mg/dL   Nitrite NEGATIVE NEGATIVE   Leukocytes,Ua LARGE (A) NEGATIVE   RBC / HPF 0-5 0 - 5 RBC/hpf   WBC, UA >50 0 - 5 WBC/hpf   Bacteria, UA RARE (A) NONE SEEN   Squamous Epithelial / HPF 0-5 0 - 5 /HPF    Comment: Performed at [facility], [address] [place], [place] [address]  Glucose, capillary     Status: Abnormal   Collection Time: 09/23/22  9:32 PM  Result Value Ref Range   Glucose-Capillary 118 (  H) 70 - 99 mg/dL    Comment: Glucose reference range applies only to samples taken after fasting for at least 8 hours.  CBC     Status: Abnormal   Collection Time: 09/24/22  5:17 AM  Result Value Ref Range   WBC 11.3 (H) 4.0 - 10.5 K/uL   RBC 3.82 (L) 3.87 - 5.11 MIL/uL   Hemoglobin 11.2 (L) 12.0 - 15.0 g/dL   HCT 16.1 (L) 09.6 - 04.5 %   MCV 92.9 80.0 - 100.0 fL   MCH 29.3 26.0 - 34.0 pg   MCHC 31.5 30.0 - 36.0 g/dL   RDW 40.9 (H) 81.1 - 91.4 %   Platelets 320 150 - 400 K/uL   nRBC 0.0 0.0 - 0.2 %    Comment: Performed at [facility], [address], [place], [place] [address]  Basic metabolic panel     Status: Abnormal   Collection Time: 09/24/22  5:17 AM  Result Value Ref Range   Sodium 136 135 - 145 mmol/L   Potassium 3.3 (L) 3.5 - 5.1 mmol/L   Chloride 103 98 - 111 mmol/L   CO2 25 22 - 32 mmol/L   Glucose, Bld 94 70 - 99 mg/dL    Comment: Glucose reference range applies only to samples taken after fasting for at least 8 hours.   BUN 11 8 - 23 mg/dL   Creatinine, Ser 6.21 (H) 0.44 - 1.00 mg/dL   Calcium 9.4 8.9 - 30.8 mg/dL   GFR, Estimated 42 (L) >60 mL/min    Comment: (NOTE) Calculated using the CKD-EPI Creatinine Equation (2021)    Anion gap 8 5 - 15    Comment: Performed at [facility], [address], [place], [place] [address]  Glucose, capillary     Status: Abnormal   Collection Time: 09/24/22  7:58 AM  Result Value Ref Range   Glucose-Capillary 101 (H) 70 - 99 mg/dL    Comment: Glucose reference range applies only to samples taken after fasting for at least 8 hours.  Glucose, capillary     Status: Abnormal   Collection Time: 09/24/22 12:11 PM  Result Value Ref Range   Glucose-Capillary 104 (H) 70 - 99 mg/dL    Comment: Glucose reference range applies only to samples taken after fasting for at least 8 hours.   *Note: Due to a large number of results and/or encounters for the requested time period, some results have not been displayed. A complete set of results can be found in Results Review.   Recent Results (from the past 240 hour(s))  SARS Coronavirus 2 by RT PCR (hospital order, performed in [facility] hospital lab) *cepheid single result test* Anterior Nasal Swab     Status: None   Collection Time: 09/23/22  8:03 AM   Specimen: Anterior Nasal Swab  Result Value Ref Range Status   SARS Coronavirus 2 by RT PCR NEGATIVE NEGATIVE Final    Comment: (NOTE) SARS-CoV-2 target nucleic acids are NOT DETECTED.  The SARS-CoV-2 RNA is generally detectable in upper and lower respiratory specimens during the acute phase of infection. The lowest concentration of SARS-CoV-2 viral copies this assay can detect is 250 copies / mL. A negative result does not preclude SARS-CoV-2 infection and should not be used as the sole basis for treatment or other patient management decisions.  A negative result may occur with improper specimen collection / handling, submission of specimen other than nasopharyngeal swab, presence of viral mutation(s) within the areas targeted by  this assay, and inadequate number of viral copies (<250 copies / mL). A negative result must be combined with clinical observations, patient history, and epidemiological information.  Fact Sheet for Patients:    RoadLapTop.co.za  Fact Sheet for Healthcare Providers: http://kim-miller.com/  This test is not yet approved or  cleared by the [place] FDA and has been authorized for detection and/or diagnosis of SARS-CoV-2 by FDA under an Emergency Use Authorization (EUA).  This EUA will remain in effect (meaning this test can be used) for the duration of the COVID-19 declaration under Section 564(b)(1) of the Act, 21 U.S.C. section 360bbb-3(b)(1), unless the authorization is terminated or revoked sooner.  Performed at [facility], [address] [place], [place] [address]    Creatinine: Recent Labs    09/23/22 0759 09/24/22 0517  CREATININE 1.53* 1.30*   Baseline Creatinine: 1  Impression/Assessment:  77yo with right ureteral calculus, UTI  Plan:  -We discussed the management of kidney stones. These options include observation, ureteroscopy, shockwave lithotripsy (ESWL) and percutaneous nephrolithotomy (PCNL). We discussed which options are relevant to the patient's stone(s). We discussed the natural history of kidney stones as well as the complications of untreated stones and the impact on quality of life without treatment as well as with each of the above listed treatments. We also discussed the efficacy of each treatment in its ability to clear the stone burden. With any of these management options I discussed the signs and symptoms of infection and the need for emergent treatment should these be experienced. For each option we discussed the ability of each procedure to clear the patient of their stone burden.   For observation I described the risks which include but are not limited to silent renal damage, life-threatening infection, need for emergent surgery, failure to pass stone and pain.   For ureteroscopy I described the risks which include bleeding, infection, damage to contiguous structures, positioning injury, ureteral stricture,  ureteral avulsion, ureteral injury, need for prolonged ureteral stent, inability to perform ureteroscopy, need for an interval procedure, inability to clear stone burden, stent discomfort/pain, heart attack, stroke, pulmonary embolus and the inherent risks with general anesthesia.   For shockwave lithotripsy I described the risks which include arrhythmia, kidney contusion, kidney hemorrhage, need for transfusion, pain, inability to adequately break up stone, inability to pass stone fragments, Steinstrasse, infection associated with obstructing stones, need for alternate surgical procedure, need for repeat shockwave lithotripsy, MI, CVA, PE and the inherent risks with anesthesia/conscious sedation.   For PCNL I described the risks including positioning injury, pneumothorax, hydrothorax, need for chest tube, inability to clear stone burden, renal laceration, arterial venous fistula or malformation, need for embolization of kidney, loss of kidney or renal function, need for repeat procedure, need for prolonged nephrostomy tube, ureteral avulsion, MI, CVA, PE and the inherent risks of general anesthesia.   - The patient would like to proceed with right ureteral stent placement  Wilkie Aye 09/24/2022, 12:55 PM

## 2022-09-24 NOTE — Progress Notes (Signed)
PROGRESS NOTE    Cheryl Abbott  PVV:[id number] DOB: April 11, 1946 DOA: 09/23/2022 PCP: Kerri Perches, MD     Brief Narrative:  Cheryl Abbott is an 77 y.o. female with a past medical history of stage III CKD, chronic shoulder pain, chronic back pain, osteoarthritis, anxiety disorder, hypertension, hyperlipidemia, type 2 diabetes, nephrolithiasis and UTIs who presented to the ED today for evaluation of weakness and falls at home.  Patient reportedly had a fall today where her legs gave out on her.  She lives alone and was brought to the ED via EMS.  She endorses some upper respiratory congestion and a nonproductive cough as well as some nausea.  She has chronic shoulder pain but it is worse since the fall.  She also reports that she hit her knees and her head during her fall.   She was noted to be mildly hypertensive, tachycardic and tachypneic on arrival. Labs are notable for a WBC of 12.8, hemoglobin 11.4, sodium 134, glucose 108, creatinine 1.53/GFR 35 (consistent with her usual baseline renal function), COVID-negative, CK2 110, lactic acid 1.0, urinalysis with a large amount of leukocytes and culture pending. Chest x-ray was negative. CT of the head was stable with no acute findings. CT of the lumbar spine showed no acute fracture or cause for her weakness. She did have arthropathy and some foraminal narrowing but no frank spinal stenosis. CT of the abdomen and pelvis showed moderate to severe right hydroureteronephrosis with a 6 mm calculus in the right ureter. Because of these findings, the ED physician consulted urology who recommended admission for IV antibiotics and plan for stent placement.  New events last 24 hours / Subjective: Patient sitting at the side of bed, her main complaint is hunger.  Currently awaiting stent placement which is scheduled for this afternoon.  Assessment & Plan:   Principal Problem:   Hydronephrosis with urinary obstruction due to ureteral  calculus Active Problems:   Type 2 diabetes with nephropathy   Hyperlipidemia LDL goal <100   Hypertension goal BP (blood pressure) < 130/80   RENAL CALCULUS   Sleep apnea   Anxiety and depression   Generalized anxiety disorder   Obesity (BMI 30.0-34.9)   Fall at home, initial encounter   Multiple myeloma   Urinary tract infection   PVD (peripheral vascular disease)   Hydroureteronephrosis   Right side hydronephrosis with urinary obstruction secondary to ureteral calculus, sepsis secondary to complicated UTI -Sepsis present on admission with leukocytosis, tachycardia, tachypnea -Urology consulted, stent placement scheduled for today -Rocephin -Urine culture is pending -Obtain blood culture  CKD stage IIIb -Baseline creatinine 1.6  Diabetes mellitus type 2 with nephropathy -Sliding scale insulin, takes Trulicity, Tradjenta at home  Hyperlipidemia -Crestor  Hypertension -Norvasc, isosorbide  Anxiety and depression -Paxil  Fall at home -Without acute injuries -She will need PT  History of multiple myeloma  Overweight -BMI 28.7  Hypokalemia -Replace  DVT prophylaxis:  Place and maintain sequential compression device Start: 09/24/22 1052 enoxaparin (LOVENOX) injection 40 mg Start: 09/23/22 2100  Code Status: Full code Family Communication: No family at bedside Disposition Plan: Home when clinically improved Status is: Inpatient Remains inpatient appropriate because: IV antibiotics   Antimicrobials:  Anti-infectives (From admission, onward)    Start     Dose/Rate Route Frequency Ordered Stop   09/24/22 1000  cefTRIAXone (ROCEPHIN) 1 g in sodium chloride 0.9 % 100 mL IVPB        1 g 200 mL/hr over 30 Minutes Intravenous Every  24 hours 09/24/22 0816     09/23/22 1145  cefTRIAXone (ROCEPHIN) 1 g in sodium chloride 0.9 % 100 mL IVPB        1 g 200 mL/hr over 30 Minutes Intravenous  Once 09/23/22 1140 09/23/22 1231        Objective: Vitals:    09/23/22 1953 09/23/22 1955 09/23/22 2323 09/24/22 0342  BP:  122/71 129/66 124/79  Pulse:  (!) 103 93 93  Resp:  20 (!) 21 19  Temp:  98.5 F (36.9 C) 98.5 F (36.9 C) 98.6 F (37 C)  TempSrc:  Oral Oral Oral  SpO2: 96% 96% 96% 96%  Weight:      Height:        Intake/Output Summary (Last 24 hours) at 09/24/2022 1214 Last data filed at 09/24/2022 0500 Gross per 24 hour  Intake --  Output 750 ml  Net -750 ml   Filed Weights   09/23/22 0747  Weight: 82 kg    Examination:  General exam: Appears calm and comfortable  Respiratory system: Clear to auscultation. Respiratory effort normal. No respiratory distress. No conversational dyspnea.  Cardiovascular system: S1 & S2 heard, RRR. No murmurs. No pedal edema. Gastrointestinal system: Abdomen is nondistended, soft and nontender. Normal bowel sounds heard. Central nervous system: Alert and oriented. No focal neurological deficits. Speech clear.  Extremities: Symmetric in appearance  Skin: No rashes, lesions or ulcers on exposed skin  Psychiatry: Judgement and insight appear normal. Mood & affect appropriate.   Data Reviewed: I have personally reviewed following labs and imaging studies  CBC: Recent Labs  Lab 09/23/22 0759 09/24/22 0517  WBC 12.8* 11.3*  NEUTROABS 10.5*  --   HGB 11.4* 11.2*  HCT 35.0* 35.5*  MCV 91.6 92.9  PLT 304 320   Basic Metabolic Panel: Recent Labs  Lab 09/23/22 0759 09/24/22 0517  NA 134* 136  K 3.5 3.3*  CL 101 103  CO2 25 25  GLUCOSE 108* 94  BUN 15 11  CREATININE 1.53* 1.30*  CALCIUM 9.4 9.4   GFR: Estimated Creatinine Clearance: 38.9 mL/min (A) (by C-G formula based on SCr of 1.3 mg/dL (H)). Liver Function Tests: Recent Labs  Lab 09/23/22 0759  AST 18  ALT 15  ALKPHOS 71  BILITOT 0.3  PROT 8.5*  ALBUMIN 3.4*   No results for input(s): "LIPASE", "AMYLASE" in the last 168 hours. No results for input(s): "AMMONIA" in the last 168 hours. Coagulation Profile: No results for  input(s): "INR", "PROTIME" in the last 168 hours. Cardiac Enzymes: Recent Labs  Lab 09/23/22 0830  CKTOTAL 210   BNP (last 3 results) No results for input(s): "PROBNP" in the last 8760 hours. HbA1C: No results for input(s): "HGBA1C" in the last 72 hours. CBG: Recent Labs  Lab 09/23/22 0806 09/23/22 2132 09/24/22 0758 09/24/22 1211  GLUCAP 114* 118* 101* 104*   Lipid Profile: No results for input(s): "CHOL", "HDL", "LDLCALC", "TRIG", "CHOLHDL", "LDLDIRECT" in the last 72 hours. Thyroid Function Tests: No results for input(s): "TSH", "T4TOTAL", "FREET4", "T3FREE", "THYROIDAB" in the last 72 hours. Anemia Panel: No results for input(s): "VITAMINB12", "FOLATE", "FERRITIN", "TIBC", "IRON", "RETICCTPCT" in the last 72 hours. Sepsis Labs: Recent Labs  Lab 09/23/22 0909  LATICACIDVEN 1.0    Recent Results (from the past 240 hour(s))  SARS Coronavirus 2 by RT PCR (hospital order, performed in [facility] hospital lab) *cepheid single result test* Anterior Nasal Swab     Status: None   Collection Time: 09/23/22  8:03 AM  Specimen: Anterior Nasal Swab  Result Value Ref Range Status   SARS Coronavirus 2 by RT PCR NEGATIVE NEGATIVE Final    Comment: (NOTE) SARS-CoV-2 target nucleic acids are NOT DETECTED.  The SARS-CoV-2 RNA is generally detectable in upper and lower respiratory specimens during the acute phase of infection. The lowest concentration of SARS-CoV-2 viral copies this assay can detect is 250 copies / mL. A negative result does not preclude SARS-CoV-2 infection and should not be used as the sole basis for treatment or other patient management decisions.  A negative result may occur with improper specimen collection / handling, submission of specimen other than nasopharyngeal swab, presence of viral mutation(s) within the areas targeted by this assay, and inadequate number of viral copies (<250 copies / mL). A negative result must be combined with  clinical observations, patient history, and epidemiological information.  Fact Sheet for Patients:   RoadLapTop.co.za  Fact Sheet for Healthcare Providers: http://kim-miller.com/  This test is not yet approved or  cleared by the [place] FDA and has been authorized for detection and/or diagnosis of SARS-CoV-2 by FDA under an Emergency Use Authorization (EUA).  This EUA will remain in effect (meaning this test can be used) for the duration of the COVID-19 declaration under Section 564(b)(1) of the Act, 21 U.S.C. section 360bbb-3(b)(1), unless the authorization is terminated or revoked sooner.  Performed at [facility], [address], [place], [place] [address]       Radiology Studies: CT L-SPINE NO CHARGE  Result Date: 09/23/2022 CLINICAL DATA:  Fall, back pain EXAM: CT LUMBAR SPINE WITHOUT CONTRAST TECHNIQUE: Multidetector CT imaging of the lumbar spine was performed without intravenous contrast administration. Multiplanar CT image reconstructions were also generated. RADIATION DOSE REDUCTION: This exam was performed according to the departmental dose-optimization program which includes automated exposure control, adjustment of the mA and/or kV according to patient size and/or use of iterative reconstruction technique. COMPARISON:  02/13/2022 FINDINGS: Segmentation: 5 lumbar type vertebrae. Alignment: Normal. Vertebrae: No acute fracture or focal pathologic process. Paraspinal and other soft tissues: See dedicated abdominopelvic CT report for assessment of the intra-abdominal findings. No paravertebral inflammatory changes or fluid collections. Disc levels: Intervertebral disc heights are preserved. Moderate bilateral facet arthropathy most pronounced at L3-L4 and L4-L5. Foraminal narrowing appears most pronounced at L4-L5 on the right. No evidence to suggest high-grade canal stenosis by CT. IMPRESSION: 1. No acute fracture or traumatic  malalignment of the lumbar spine. 2. Moderate bilateral facet arthropathy most pronounced at L3-L4 and L4-L5. Foraminal narrowing appears most pronounced at L4-L5 on the right. Electronically Signed   By: Duanne Guess D.O.   On: 09/23/2022 13:52   CT ABDOMEN PELVIS W CONTRAST  Result Date: 09/23/2022 CLINICAL DATA:  Weakness, pyelonephritis. EXAM: CT ABDOMEN AND PELVIS WITH CONTRAST TECHNIQUE: Multidetector CT imaging of the abdomen and pelvis was performed using the standard protocol following bolus administration of intravenous contrast. RADIATION DOSE REDUCTION: This exam was performed according to the departmental dose-optimization program which includes automated exposure control, adjustment of the mA and/or kV according to patient size and/or use of iterative reconstruction technique. CONTRAST:  80mL OMNIPAQUE IOHEXOL 300 MG/ML  SOLN COMPARISON:  February 13, 2022. FINDINGS: Lower chest: Minimal bibasilar subsegmental atelectasis. Hepatobiliary: No focal liver abnormality is seen. No gallstones, gallbladder wall thickening, or biliary dilatation. Pancreas: Unremarkable. No pancreatic ductal dilatation or surrounding inflammatory changes. Spleen: Normal in size without focal abnormality. Adrenals/Urinary Tract: Adrenal glands appear normal. Mild nonobstructive left nephrolithiasis. Minimal left hydroureteronephrosis is noted  without obstructing calculus. Moderate to severe right hydroureteronephrosis is noted secondary to 6 mm calculus in midportion of right ureter. Urinary bladder is unremarkable. Stomach/Bowel: Stomach is within normal limits. Appendix appears normal. No evidence of bowel wall thickening, distention, or inflammatory changes. Diverticulosis of descending and sigmoid colon is noted without inflammation. Vascular/Lymphatic: Aortic atherosclerosis. No enlarged abdominal or pelvic lymph nodes. Reproductive: Status post hysterectomy. No adnexal masses. Other: No abdominal wall hernia or  abnormality. No abdominopelvic ascites. Musculoskeletal: No acute or significant osseous findings. IMPRESSION: Moderate to severe right hydroureteronephrosis is noted secondary to 6 mm calculus in midportion of right ureter. Nonobstructive left nephrolithiasis. Minimal left hydronephrosis is noted without obstructing calculus. Diverticulosis of descending and sigmoid colon without inflammation. Aortic Atherosclerosis (ICD10-I70.0). Electronically Signed   By: Lupita Raider M.D.   On: 09/23/2022 13:50   DG Chest Portable 1 View  Result Date: 09/23/2022 CLINICAL DATA:  Altered mental status, generalized weakness. EXAM: PORTABLE CHEST 1 VIEW COMPARISON:  Chest x-rays dated 03/21/2022 and 06/07/2021. FINDINGS: Heart size and mediastinal contours are stable. Lungs are clear. No pleural effusion or pneumothorax is seen. Chronic elevation of the RIGHT hemidiaphragm. Osseous structures about the chest are unremarkable. IMPRESSION: No active disease. No evidence of pneumonia or pulmonary edema. Electronically Signed   By: Bary Richard M.D.   On: 09/23/2022 09:09   CT Head Wo Contrast  Result Date: 09/23/2022 CLINICAL DATA:  Mental status change with unknown cause. EXAM: CT HEAD WITHOUT CONTRAST TECHNIQUE: Contiguous axial images were obtained from the base of the skull through the vertex without intravenous contrast. RADIATION DOSE REDUCTION: This exam was performed according to the departmental dose-optimization program which includes automated exposure control, adjustment of the mA and/or kV according to patient size and/or use of iterative reconstruction technique. COMPARISON:  03/07/2016 FINDINGS: Brain: No evidence of acute infarction, hemorrhage, hydrocephalus, extra-axial collection or mass lesion/mass effect. Generalized cerebral volume loss with mild chronic small vessel ischemia. Vascular: No hyperdense vessel or unexpected calcification. Skull: Normal. Negative for fracture or focal lesion.  Sinuses/Orbits: Negative IMPRESSION: No acute finding.  Stable head CT when compared to 2017. Electronically Signed   By: Tiburcio Pea M.D.   On: 09/23/2022 09:05      Scheduled Meds:  amLODipine  10 mg Oral Daily   enoxaparin (LOVENOX) injection  40 mg Subcutaneous Q24H   insulin aspart  0-15 Units Subcutaneous TID WC   insulin aspart  0-5 Units Subcutaneous QHS   isosorbide mononitrate  15 mg Oral Daily   linagliptin  5 mg Oral Daily   mometasone-formoterol  2 puff Inhalation BID   pantoprazole  40 mg Oral BID   PARoxetine  40 mg Oral q AM   rosuvastatin  40 mg Oral Daily   Continuous Infusions:  0.9 % NaCl with KCl 20 mEq / L 75 mL/hr at 09/23/22 2354   cefTRIAXone (ROCEPHIN)  IV 1 g (09/24/22 1002)     LOS: 1 day   Time spent: 25 minutes   Noralee Stain, DO [facility] 09/24/2022, 12:14 PM   Available via Epic secure chat 7am-7pm After these hours, please refer to coverage provider listed on [email or web address]

## 2022-09-25 DIAGNOSIS — N132 Hydronephrosis with renal and ureteral calculous obstruction: Secondary | ICD-10-CM | POA: Diagnosis not present

## 2022-09-25 LAB — URINE CULTURE: Culture: 40000 — AB

## 2022-09-25 LAB — BASIC METABOLIC PANEL
Anion gap: 5 (ref 5–15)
BUN: 14 mg/dL (ref 8–23)
CO2: 25 mmol/L (ref 22–32)
Calcium: 9.9 mg/dL (ref 8.9–10.3)
Chloride: 104 mmol/L (ref 98–111)
Creatinine, Ser: 1.32 mg/dL — ABNORMAL HIGH (ref 0.44–1.00)
GFR, Estimated: 41 mL/min — ABNORMAL LOW (ref >60)
Glucose, Bld: 128 mg/dL — ABNORMAL HIGH (ref 70–99)
Potassium: 4.3 mmol/L (ref 3.5–5.1)
Sodium: 134 mmol/L — ABNORMAL LOW (ref 135–145)

## 2022-09-25 LAB — CBC
HCT: 35.2 % — ABNORMAL LOW (ref 36.0–46.0)
Hemoglobin: 11.2 g/dL — ABNORMAL LOW (ref 12.0–15.0)
MCH: 29.6 pg (ref 26.0–34.0)
MCHC: 31.8 g/dL (ref 30.0–36.0)
MCV: 93.1 fL (ref 80.0–100.0)
Platelets: 344 10*3/uL (ref 150–400)
RBC: 3.78 MIL/uL — ABNORMAL LOW (ref 3.87–5.11)
RDW: 16.7 % — ABNORMAL HIGH (ref 11.5–15.5)
WBC: 12.3 10*3/uL — ABNORMAL HIGH (ref 4.0–10.5)
nRBC: 0 % (ref 0.0–0.2)

## 2022-09-25 LAB — GLUCOSE, CAPILLARY
Glucose-Capillary: 165 mg/dL — ABNORMAL HIGH (ref 70–99)
Glucose-Capillary: 231 mg/dL — ABNORMAL HIGH (ref 70–99)
Glucose-Capillary: 95 mg/dL (ref 70–99)
Glucose-Capillary: 98 mg/dL (ref 70–99)

## 2022-09-25 MED ORDER — CHLORHEXIDINE GLUCONATE CLOTH 2 % EX PADS
6.0000 | MEDICATED_PAD | Freq: Every day | CUTANEOUS | Status: DC
  Administered 2022-09-25 – 2022-09-27 (×3): 6 via TOPICAL

## 2022-09-25 NOTE — Progress Notes (Signed)
PROGRESS NOTE    Cheryl Abbott  WUJ:[id number] DOB: Mar 29, 1946 DOA: 09/23/2022 PCP: Kerri Perches, MD     Brief Narrative:  Cheryl Abbott is an 77 y.o. female with a past medical history of stage III CKD, chronic shoulder pain, chronic back pain, osteoarthritis, anxiety disorder, hypertension, hyperlipidemia, type 2 diabetes, nephrolithiasis and UTIs who presented to the ED today for evaluation of weakness and falls at home.  Patient reportedly had a fall today where her legs gave out on her.  She lives alone and was brought to the ED via EMS.  She endorses some upper respiratory congestion and a nonproductive cough as well as some nausea.  She has chronic shoulder pain but it is worse since the fall.  She also reports that she hit her knees and her head during her fall.   She was noted to be mildly hypertensive, tachycardic and tachypneic on arrival. Labs are notable for a WBC of 12.8, hemoglobin 11.4, sodium 134, glucose 108, creatinine 1.53/GFR 35 (consistent with her usual baseline renal function), COVID-negative, CK2 110, lactic acid 1.0, urinalysis with a large amount of leukocytes and culture pending. Chest x-ray was negative. CT of the head was stable with no acute findings. CT of the lumbar spine showed no acute fracture or cause for her weakness. She did have arthropathy and some foraminal narrowing but no frank spinal stenosis. CT of the abdomen and pelvis showed moderate to severe right hydroureteronephrosis with a 6 mm calculus in the right ureter. Because of these findings, the ED physician consulted urology who recommended admission for IV antibiotics and plan for stent placement.  Patient underwent cystoscopy, right JJ stent placement by Dr. Ronne Binning 4/15.  New events last 24 hours / Subjective: Feeling well this morning.  Eating breakfast.  Foley catheter is in place.  Assessment & Plan:   Principal Problem:   Hydronephrosis with urinary obstruction due to ureteral  calculus Active Problems:   Type 2 diabetes with nephropathy   Hyperlipidemia LDL goal <100   Hypertension goal BP (blood pressure) < 130/80   RENAL CALCULUS   Sleep apnea   Anxiety and depression   Generalized anxiety disorder   Obesity (BMI 30.0-34.9)   Fall at home, initial encounter   Multiple myeloma   Urinary tract infection   Ureteral calculus, right   PVD (peripheral vascular disease)   Hydroureteronephrosis   Right side hydronephrosis with urinary obstruction secondary to ureteral calculus, sepsis secondary to complicated UTI -Sepsis present on admission with leukocytosis, tachycardia, tachypnea -Urology consulted, status post right JJ stent placement 4/15 -Rocephin -Urine culture pending -Blood culture pending  CKD stage IIIb -Baseline creatinine 1.6 -Stable  Diabetes mellitus type 2 with nephropathy -Sliding scale insulin, takes Trulicity, Tradjenta at home  Hyperlipidemia -Crestor  Hypertension -Norvasc, isosorbide  Anxiety and depression -Paxil  Fall at home -Without acute injuries -She will need PT  History of multiple myeloma  Overweight -BMI 28.7  Hypokalemia -Replace  DVT prophylaxis:  Place and maintain sequential compression device Start: 09/24/22 1052 enoxaparin (LOVENOX) injection 40 mg Start: 09/23/22 2100  Code Status: Full code Family Communication: No family at bedside Disposition Plan: Home when clinically improved Status is: Inpatient Remains inpatient appropriate because: Awaiting discharge clearance by urology team.  Urine culture is pending.  Remains on IV Rocephin.  Need PT evaluation.   Antimicrobials:  Anti-infectives (From admission, onward)    Start     Dose/Rate Route Frequency Ordered Stop   09/24/22 1000  cefTRIAXone (  ROCEPHIN) 1 g in sodium chloride 0.9 % 100 mL IVPB        1 g 200 mL/hr over 30 Minutes Intravenous Every 24 hours 09/24/22 0816     09/23/22 1145  cefTRIAXone (ROCEPHIN) 1 g in sodium chloride  0.9 % 100 mL IVPB        1 g 200 mL/hr over 30 Minutes Intravenous  Once 09/23/22 1140 09/23/22 1231        Objective: Vitals:   09/24/22 2122 09/25/22 0425 09/25/22 0829 09/25/22 0840  BP: 134/86 135/82 128/80   Pulse: 99 88    Resp: 16 20    Temp: 98.6 F (37 C) 98.6 F (37 C)    TempSrc: Oral Oral    SpO2: 94% 100%  93%  Weight:      Height:        Intake/Output Summary (Last 24 hours) at 09/25/2022 1052 Last data filed at 09/25/2022 0700 Gross per 24 hour  Intake 1160 ml  Output 1100 ml  Net 60 ml    Filed Weights   09/23/22 0747  Weight: 82 kg    Examination:  General exam: Appears calm and comfortable  Respiratory system: Clear to auscultation. Respiratory effort normal. No respiratory distress. No conversational dyspnea.  Cardiovascular system: S1 & S2 heard, RRR. No murmurs. No pedal edema. Gastrointestinal system: Abdomen is nondistended, soft and nontender. Normal bowel sounds heard. Central nervous system: Alert and oriented. No focal neurological deficits. Speech clear.  Extremities: Symmetric in appearance  Skin: No rashes, lesions or ulcers on exposed skin  Psychiatry: Judgement and insight appear normal. Mood & affect appropriate.   Data Reviewed: I have personally reviewed following labs and imaging studies  CBC: Recent Labs  Lab 09/23/22 0759 09/24/22 0517 09/25/22 0417  WBC 12.8* 11.3* 12.3*  NEUTROABS 10.5*  --   --   HGB 11.4* 11.2* 11.2*  HCT 35.0* 35.5* 35.2*  MCV 91.6 92.9 93.1  PLT 304 320 344    Basic Metabolic Panel: Recent Labs  Lab 09/23/22 0759 09/24/22 0517 09/25/22 0417  NA 134* 136 134*  K 3.5 3.3* 4.3  CL 101 103 104  CO2 GLUCOSE 108* 94 128*  BUN CREATININE 1.53* 1.30* 1.32*  CALCIUM 9.4 9.4 9.9    GFR: Estimated Creatinine Clearance: 38.3 mL/min (A) (by C-G formula based on SCr of 1.32 mg/dL (H)). Liver Function Tests: Recent Labs  Lab 09/23/22 0759  AST 18  ALT 15  ALKPHOS 71   BILITOT 0.3  PROT 8.5*  ALBUMIN 3.4*    No results for input(s): "LIPASE", "AMYLASE" in the last 168 hours. No results for input(s): "AMMONIA" in the last 168 hours. Coagulation Profile: No results for input(s): "INR", "PROTIME" in the last 168 hours. Cardiac Enzymes: Recent Labs  Lab 09/23/22 0830  CKTOTAL 210    BNP (last 3 results) No results for input(s): "PROBNP" in the last 8760 hours. HbA1C: No results for input(s): "HGBA1C" in the last 72 hours. CBG: Recent Labs  Lab 09/24/22 1211 09/24/22 1357 09/24/22 1707 09/24/22 2127 09/25/22 0659  GLUCAP 104* 91 155* 132* 165*    Lipid Profile: No results for input(s): "CHOL", "HDL", "LDLCALC", "TRIG", "CHOLHDL", "LDLDIRECT" in the last 72 hours. Thyroid Function Tests: No results for input(s): "TSH", "T4TOTAL", "FREET4", "T3FREE", "THYROIDAB" in the last 72 hours. Anemia Panel: No results for input(s): "VITAMINB12", "FOLATE", "FERRITIN", "TIBC", "IRON", "RETICCTPCT" in the last 72 hours. Sepsis Labs: Recent Labs  Lab 09/23/22 0909  LATICACIDVEN 1.0     Recent Results (from the past 240 hour(s))  SARS Coronavirus 2 by RT PCR (hospital order, performed in [facility] hospital lab) *cepheid single result test* Anterior Nasal Swab     Status: None   Collection Time: 09/23/22  8:03 AM   Specimen: Anterior Nasal Swab  Result Value Ref Range Status   SARS Coronavirus 2 by RT PCR NEGATIVE NEGATIVE Final    Comment: (NOTE) SARS-CoV-2 target nucleic acids are NOT DETECTED.  The SARS-CoV-2 RNA is generally detectable in upper and lower respiratory specimens during the acute phase of infection. The lowest concentration of SARS-CoV-2 viral copies this assay can detect is 250 copies / mL. A negative result does not preclude SARS-CoV-2 infection and should not be used as the sole basis for treatment or other patient management decisions.  A negative result may occur with improper specimen collection / handling, submission  of specimen other than nasopharyngeal swab, presence of viral mutation(s) within the areas targeted by this assay, and inadequate number of viral copies (<250 copies / mL). A negative result must be combined with clinical observations, patient history, and epidemiological information.  Fact Sheet for Patients:   RoadLapTop.co.za  Fact Sheet for Healthcare Providers: http://kim-miller.com/  This test is not yet approved or  cleared by the [place] FDA and has been authorized for detection and/or diagnosis of SARS-CoV-2 by FDA under an Emergency Use Authorization (EUA).  This EUA will remain in effect (meaning this test can be used) for the duration of the COVID-19 declaration under Section 564(b)(1) of the Act, 21 U.S.C. section 360bbb-3(b)(1), unless the authorization is terminated or revoked sooner.  Performed at [facility], [address] [place], [place] [address]   Urine Culture     Status: None (Preliminary result)   Collection Time: 09/23/22 11:09 AM   Specimen: Urine, Clean Catch  Result Value Ref Range Status   Specimen Description   Final    URINE, CLEAN CATCH Performed at [facility], [address], [place], [place] [address]    Special Requests   Final    NONE Performed at [facility], [address], [place], [place] [address]    Culture   Final    CULTURE REINCUBATED FOR BETTER GROWTH Performed at [facility], [address], [place], [place] [address]    Report Status PENDING  Incomplete  Culture, blood (Routine X 2) w Reflex to ID Panel     Status: None (Preliminary result)   Collection Time: 09/24/22  5:57 PM   Specimen: BLOOD RIGHT HAND  Result Value Ref Range Status   Specimen Description BLOOD RIGHT HAND  Final   Special Requests   Final    BOTTLES DRAWN AEROBIC AND ANAEROBIC Blood Culture results may not be optimal due to an excessive volume of blood received in culture bottles Performed at  [facility], [address], [place], [place] [address]    Culture PENDING  Incomplete   Report Status PENDING  Incomplete  Culture, blood (Routine X 2) w Reflex to ID Panel     Status: None (Preliminary result)   Collection Time: 09/24/22  5:57 PM   Specimen: BLOOD LEFT HAND  Result Value Ref Range Status   Specimen Description BLOOD LEFT HAND AEROBIC BOTTLE ONLY  Final   Special Requests   Final    Blood Culture results may not be optimal due to an excessive volume of blood received in culture bottles  Performed at [facility], [address] [place], [place] [address]    Culture PENDING  Incomplete   Report Status PENDING  Incomplete      Radiology Studies: DG C-Arm 1-60 Min-No Report  Result Date: 09/24/2022 Fluoroscopy was utilized by the requesting physician.  No radiographic interpretation.   CT L-SPINE NO CHARGE  Result Date: 09/23/2022 CLINICAL DATA:  Fall, back pain EXAM: CT LUMBAR SPINE WITHOUT CONTRAST TECHNIQUE: Multidetector CT imaging of the lumbar spine was performed without intravenous contrast administration. Multiplanar CT image reconstructions were also generated. RADIATION DOSE REDUCTION: This exam was performed according to the departmental dose-optimization program which includes automated exposure control, adjustment of the mA and/or kV according to patient size and/or use of iterative reconstruction technique. COMPARISON:  02/13/2022 FINDINGS: Segmentation: 5 lumbar type vertebrae. Alignment: Normal. Vertebrae: No acute fracture or focal pathologic process. Paraspinal and other soft tissues: See dedicated abdominopelvic CT report for assessment of the intra-abdominal findings. No paravertebral inflammatory changes or fluid collections. Disc levels: Intervertebral disc heights are preserved. Moderate bilateral facet arthropathy most pronounced at L3-L4 and L4-L5. Foraminal narrowing appears most pronounced at L4-L5 on the right. No evidence to suggest high-grade canal  stenosis by CT. IMPRESSION: 1. No acute fracture or traumatic malalignment of the lumbar spine. 2. Moderate bilateral facet arthropathy most pronounced at L3-L4 and L4-L5. Foraminal narrowing appears most pronounced at L4-L5 on the right. Electronically Signed   By: Duanne Guess D.O.   On: 09/23/2022 13:52   CT ABDOMEN PELVIS W CONTRAST  Result Date: 09/23/2022 CLINICAL DATA:  Weakness, pyelonephritis. EXAM: CT ABDOMEN AND PELVIS WITH CONTRAST TECHNIQUE: Multidetector CT imaging of the abdomen and pelvis was performed using the standard protocol following bolus administration of intravenous contrast. RADIATION DOSE REDUCTION: This exam was performed according to the departmental dose-optimization program which includes automated exposure control, adjustment of the mA and/or kV according to patient size and/or use of iterative reconstruction technique. CONTRAST:  80mL OMNIPAQUE IOHEXOL 300 MG/ML  SOLN COMPARISON:  February 13, 2022. FINDINGS: Lower chest: Minimal bibasilar subsegmental atelectasis. Hepatobiliary: No focal liver abnormality is seen. No gallstones, gallbladder wall thickening, or biliary dilatation. Pancreas: Unremarkable. No pancreatic ductal dilatation or surrounding inflammatory changes. Spleen: Normal in size without focal abnormality. Adrenals/Urinary Tract: Adrenal glands appear normal. Mild nonobstructive left nephrolithiasis. Minimal left hydroureteronephrosis is noted without obstructing calculus. Moderate to severe right hydroureteronephrosis is noted secondary to 6 mm calculus in midportion of right ureter. Urinary bladder is unremarkable. Stomach/Bowel: Stomach is within normal limits. Appendix appears normal. No evidence of bowel wall thickening, distention, or inflammatory changes. Diverticulosis of descending and sigmoid colon is noted without inflammation. Vascular/Lymphatic: Aortic atherosclerosis. No enlarged abdominal or pelvic lymph nodes. Reproductive: Status post  hysterectomy. No adnexal masses. Other: No abdominal wall hernia or abnormality. No abdominopelvic ascites. Musculoskeletal: No acute or significant osseous findings. IMPRESSION: Moderate to severe right hydroureteronephrosis is noted secondary to 6 mm calculus in midportion of right ureter. Nonobstructive left nephrolithiasis. Minimal left hydronephrosis is noted without obstructing calculus. Diverticulosis of descending and sigmoid colon without inflammation. Aortic Atherosclerosis (ICD10-I70.0). Electronically Signed   By: Lupita Raider M.D.   On: 09/23/2022 13:50      Scheduled Meds:  amLODipine  10 mg Oral Daily   enoxaparin (LOVENOX) injection  40 mg Subcutaneous Q24H   insulin aspart  0-15 Units Subcutaneous TID WC   insulin aspart  0-5 Units Subcutaneous QHS   isosorbide mononitrate  15 mg Oral Daily   linagliptin  5 mg Oral Daily   mometasone-formoterol  2 puff Inhalation BID   pantoprazole  40 mg Oral BID   PARoxetine  40 mg Oral q AM   rosuvastatin  40 mg Oral Daily   Continuous Infusions:  0.9 % NaCl with KCl 20 mEq / L 75 mL/hr at 09/25/22 0454   cefTRIAXone (ROCEPHIN)  IV 1 g (09/25/22 0842)     LOS: 2 days   Time spent: 25 minutes   Noralee Stain, DO [facility] 09/25/2022, 10:52 AM   Available via Epic secure chat 7am-7pm After these hours, please refer to coverage provider listed on [email or web address]

## 2022-09-25 NOTE — Progress Notes (Signed)
Mobility Specialist Progress Note:   09/25/22 0945  Mobility  Activity Repositioned in chair (*bed)  Level of Assistance Minimal assist, patient does 75% or more  Assistive Device None  Activity Response Tolerated well  Mobility Referral Yes  $Mobility charge 1 Mobility   Pt requested assistance shifting up in bed.  MinA required to reposition. Left pt with all needs met, call bell in reach.   Feliciana Rossetti Mobility Specialist Please contact via Special educational needs teacher or  Rehab office at [phone]

## 2022-09-25 NOTE — TOC Initial Note (Signed)
Transition of Care [facility]) - Initial/Assessment Note    Patient Details  Name: Cheryl Abbott MRN: [id number] Date of Birth: [birth year]  [facility]) CM/SW Contact:    Karn Cassis, LCSW Phone Number: 09/25/2022, 11:45 AM  Clinical Narrative:  Pt reports she lives alone. She has someone who helps with dishes and groceries and her children pay a friend to help with transportation and other tasks. PT evaluated pt and recommend home health. Discussed with pt who states she has used [facility] in the past and would like to use them again. Morrie Sheldon with [facility] accepts for HHPT/OT. MD notified for orders.                   Expected Discharge Plan: Home w Home Health Services Barriers to Discharge: Continued Medical Work up   Patient Goals and CMS Choice Patient states their goals for this hospitalization and ongoing recovery are:: return home   Choice offered to / list presented to : Patient [place] ownership interest in [facility].provided to::  (n/a)    Expected Discharge Plan and Services In-house Referral: Clinical Social Work   Post Acute Care Choice: Home Health Living arrangements for the past 2 months: Single Family Home                           HH Arranged: PT, OT HH Agency: [facility] Date HH Agency Contacted: 09/25/22 Time HH Agency Contacted: 1144 Representative spoke with at [facility] [place] Agency: Morrie Sheldon  Prior Living Arrangements/Services Living arrangements for the past 2 months: Single Family Home Lives with:: Self Patient language and need for interpreter reviewed:: Yes Do you feel safe going back to the place where you live?: Yes          Current home services: DME (Cane, walker) Criminal Activity/Legal Involvement Pertinent to Current Situation/Hospitalization: No - Comment as needed  Activities of Daily Living Home Assistive Devices/Equipment: Cane (specify quad or straight) (quad per patient) ADL Screening  (condition at time of admission) Patient's cognitive ability adequate to safely complete daily activities?: No Is the patient deaf or have difficulty hearing?: No (hallucinations) Does the patient have difficulty seeing, even when wearing glasses/contacts?: No Does the patient have difficulty concentrating, remembering, or making decisions?: No Patient able to express need for assistance with ADLs?: No Does the patient have difficulty dressing or bathing?: No Independently performs ADLs?: No Communication: Independent Dressing (OT): Independent Grooming: Needs assistance Is this a change from baseline?: Pre-admission baseline Feeding: Independent Bathing: Needs assistance Is this a change from baseline?: Pre-admission baseline Toileting: Independent In/Out Bed: Needs assistance Is this a change from baseline?: Pre-admission baseline Walks in Home: Independent Does the patient have difficulty walking or climbing stairs?: Yes (uses a cane) Weakness of Legs: Both Weakness of Arms/Hands: Both  Permission Sought/Granted                  Emotional Assessment     Affect (typically observed): Appropriate Orientation: : Oriented to Self, Oriented to Place, Oriented to  Time, Oriented to Situation Alcohol / Substance Use: Not Applicable Psych Involvement: No (comment)  Admission diagnosis:  Hydroureteronephrosis [N13.30] Ureteral calculus, right [N20.1] Acute cystitis without hematuria [N30.00] Patient Active Problem List   Diagnosis Date Noted   Hydroureteronephrosis 09/23/2022   Encounter for Medicare annual examination with abnormal findings 07/13/2022   Ganglion cyst of dorsum of left wrist 07/13/2022   Knee pain, right 07/13/2022  Hx of adenomatous colonic polyps 05/08/2022   PVD (peripheral vascular disease) 03/21/2022   Ingrown nail of great toe 07/28/2021   Tick bite of ankle 07/28/2021   Fatigue 07/06/2021   Hypokalemia 06/12/2021   Ureteral calculus, right  05/30/2021   Hydronephrosis with urinary obstruction due to ureteral calculus 05/30/2021   Abdominal distension 05/03/2021   Nasal congestion 03/27/2021   Herniated nucleus pulposus, C5-6 12/26/2020   Herniated nucleus pulposus, C6-7 12/26/2020   Cervical radiculitis 11/14/2020   Numbness of hand 11/14/2020   Rash and nonspecific skin eruption 06/25/2020   Iron deficiency anemia 02/10/2020   Flank pain 01/11/2020   Pelvic pressure in female 11/17/2019   Adenomatous polyps    Cervical spondylosis 07/13/2019   Cervical spondylosis with radiculopathy 07/13/2019   Muscle cramps 07/01/2019   Depression, major, single episode, in partial remission 03/13/2019   Eyelid cyst, right 01/15/2019   Urinary tract infection 01/04/2019   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   Chronic left shoulder pain 02/28/2018   Palpable mass of neck 02/28/2018   Insomnia 11/09/2017   Multiple myeloma 03/15/2017   Urinary frequency 02/28/2017   Monoclonal paraproteinemia 02/15/2017   Fall at home, initial encounter 08/19/2015   CKD (chronic kidney disease) stage 3, GFR 30-59 ml/min (HCC) 07/12/2015   Anemia 10/14/2014   Hypoxia 02/04/2014   Obesity (BMI 30.0-34.9) 01/02/2014   Generalized anxiety disorder 05/20/2013   Chronic right shoulder pain 02/02/2013   Anxiety and depression 08/13/2012   Low back pain with left-sided sciatica 12/22/2010   Colon adenomas 05/11/2009   GERD 03/15/2009   FATTY LIVER DISEASE 03/15/2009   RENAL CALCULUS 03/15/2009   Chest pain 01/19/2009   Sleep apnea 09/15/2008   Type 2 diabetes with nephropathy 02/02/2008   Hyperlipidemia LDL goal <100 09/17/2007   Hypertension goal BP (blood pressure) < 130/80 09/17/2007   PCP:  Kerri Perches, MD Pharmacy:   [facility] [phone] - [place], [place] - [address] [facility] [address] [place] [address] Phone: [phone] Fax: [phone]     Social Determinants of Health (SDOH) Social History: SDOH  Screenings   Food Insecurity: No Food Insecurity (07/06/2022)  Housing: Low Risk  (07/06/2022)  Transportation Needs: No Transportation Needs (07/06/2022)  Utilities: Not At Risk (07/06/2022)  Alcohol Screen: Low Risk  (07/06/2022)  Depression (PHQ2-9): Medium Risk (07/10/2022)  Financial Resource Strain: Low Risk  (07/06/2022)  Physical Activity: Inactive (07/06/2022)  Social Connections: Moderately Isolated (07/06/2022)  Stress: No Stress Concern Present (07/06/2022)  Tobacco Use: Medium Risk (09/24/2022)   SDOH Interventions:     Readmission Risk Interventions     No data to display

## 2022-09-25 NOTE — Progress Notes (Signed)
PT reported to this writer that they ambulated patient in the hallway on room air, oxygen saturation stayed above 90% on room air. Noted while patient sitting in chair oxygen saturation 94% at room air. MD Alvino Chapel made aware.

## 2022-09-25 NOTE — Evaluation (Signed)
Physical Therapy Evaluation Patient Details Name: Cheryl Abbott MRN: [id number] DOB: 1945-06-16 Today's Date: 09/25/2022  History of Present Illness  Cheryl Abbott is an 77 y.o. female with a past medical history of stage III CKD, chronic shoulder pain, chronic back pain, osteoarthritis, anxiety disorder, hypertension, hyperlipidemia, type 2 diabetes, nephrolithiasis and UTIs who presented to the ED today for evaluation of weakness and falls at home.  Patient reportedly had a fall today where her legs gave out on her.  She lives alone and was brought to the ED via EMS.  She endorses some upper respiratory congestion and a nonproductive cough as well as some nausea.  She has chronic shoulder pain but it is worse since the fall.  She also reports that she hit her knees and her head during her fall.   Clinical Impression  Patient demonstrates slow labored movement with fair/good return for sitting up at bedside with [facility] flat, good return for transferring to/from chair without AD and using SPC, tolerated walking in hallway with mostly step-to pattern using cane without loss of balance, but limited mostly due to c/o fatigue.  Patient tolerated sitting up in chair after therapy - nurse aware.  Patient will benefit from continued skilled physical therapy in hospital and recommended venue below to increase strength, balance, endurance for safe ADLs and gait.         Recommendations for follow up therapy are one component of a multi-disciplinary discharge planning process, led by the attending physician.  Recommendations may be updated based on patient status, additional functional criteria and insurance authorization.  Follow Up Recommendations       Assistance Recommended at Discharge Set up Supervision/Assistance  Patient can return home with the following  A little help with walking and/or transfers;A little help with bathing/dressing/bathroom;Help with stairs or ramp for entrance;Assistance  with cooking/housework    Equipment Recommendations None recommended by PT  Recommendations for Other Services       Functional Status Assessment Patient has had a recent decline in their functional status and demonstrates the ability to make significant improvements in function in a reasonable and predictable amount of time.     Precautions / Restrictions Precautions Precautions: Fall Restrictions Weight Bearing Restrictions: No      Mobility  Bed Mobility Overal bed mobility: Needs Assistance Bed Mobility: Supine to Sit     Supine to sit: Supervision, Min guard     General bed mobility comments: increased time with labored movement    Transfers Overall transfer level: Needs assistance Equipment used: None, Straight cane Transfers: Sit to/from Stand, Bed to chair/wheelchair/BSC Sit to Stand: Supervision, Min guard   Step pivot transfers: Supervision, Min guard       General transfer comment: good return for transferring to chair without AD and using SPC    Ambulation/Gait Ambulation/Gait assistance: Min guard Gait Distance (Feet): 45 Feet Assistive device: Straight cane Gait Pattern/deviations: Decreased step length - right, Decreased step length - left, Step-to pattern, Decreased stride length Gait velocity: decreased     General Gait Details: slow labored cadence with mostly step-to pattern using SPC without loss of balance, limited mostly due to fatigue  Stairs            Wheelchair Mobility    Modified Rankin (Stroke Patients Only)       Balance Overall balance assessment: Needs assistance Sitting-balance support: Feet supported, No upper extremity supported Sitting balance-Leahy Scale: Good Sitting balance - Comments: seated at EOB   Standing  balance support: During functional activity, No upper extremity supported Standing balance-Leahy Scale: Fair Standing balance comment: fair/good using SPC                              Pertinent Vitals/Pain Pain Assessment Pain Assessment: No/denies pain    Home Living Family/patient expects to be discharged to:: Private residence Living Arrangements: Alone Available Help at Discharge: Family;Friend(s);Available PRN/intermittently Type of Home: Mobile home Home Access: Stairs to enter Entrance Stairs-Rails: Right;Left;Can reach both Entrance Stairs-Number of Steps: 4-5   Home Layout: One level Home Equipment: Agricultural consultant (2 wheels);Cane - single point;BSC/3in1;Shower seat      Prior Function Prior Level of Function : Independent/Modified Independent             Mobility Comments: Household and short distanced community ambulator using [facility] ADLs Comments: Independent with household ADL's, assisted by friends/family for community ADL's     Hand Dominance   Dominant Hand: Right    Extremity/Trunk Assessment   Upper Extremity Assessment Upper Extremity Assessment: Overall WFL for tasks assessed    Lower Extremity Assessment Lower Extremity Assessment: Generalized weakness    Cervical / Trunk Assessment Cervical / Trunk Assessment: Normal  Communication   Communication: No difficulties  Cognition Arousal/Alertness: Awake/alert Behavior During Therapy: WFL for tasks assessed/performed Overall Cognitive Status: Within Functional Limits for tasks assessed                                          General Comments      Exercises     Assessment/Plan    PT Assessment Patient needs continued PT services  PT Problem List Decreased strength;Decreased activity tolerance;Decreased balance;Decreased mobility       PT Treatment Interventions DME instruction;Gait training;Stair training;Functional mobility training;Therapeutic activities;Therapeutic exercise;Patient/family education;Balance training    PT Goals (Current goals can be found in the Care Plan section)  Acute Rehab PT Goals Patient Stated Goal: return home with  family, friends to assist PT Goal Formulation: With patient Time For Goal Achievement: 09/29/22 Potential to Achieve Goals: Good    Frequency Min 3X/week     Co-evaluation               AM-PAC PT "6 Clicks" Mobility  Outcome Measure Help needed turning from your back to your side while in a flat bed without using bedrails?: None Help needed moving from lying on your back to sitting on the side of a flat bed without using bedrails?: A Little Help needed moving to and from a bed to a chair (including a wheelchair)?: A Little Help needed standing up from a chair using your arms (e.g., wheelchair or bedside chair)?: A Little Help needed to walk in hospital room?: A Little Help needed climbing 3-5 steps with a railing? : A Little 6 Click Score: 19    End of Session   Activity Tolerance: Patient tolerated treatment well;Patient limited by fatigue Patient left: in chair;with call bell/phone within reach;with chair alarm set Nurse Communication: Mobility status PT Visit Diagnosis: Unsteadiness on feet (R26.81);Other abnormalities of gait and mobility (R26.89);Muscle weakness (generalized) (M62.81)    Time: 0762-2633 PT Time Calculation (min) (ACUTE ONLY): 32 min   Charges:   PT Evaluation $PT Eval Moderate Complexity: 1 Mod PT Treatments $Therapeutic Activity: 23-37 mins        2:52 PM, 09/25/22  Lonell Grandchild, MPT Physical Therapist with [facility] [phone] office [phone] mobile phone

## 2022-09-25 NOTE — Plan of Care (Signed)
  Problem: Acute Rehab PT Goals(only PT should resolve) Goal: Pt Will Go Supine/Side To Sit Outcome: Progressing Flowsheets (Taken 09/25/2022 1453) Pt will go Supine/Side to Sit:  Independently  with modified independence Goal: Patient Will Transfer Sit To/From Stand Outcome: Progressing Flowsheets (Taken 09/25/2022 1453) Patient will transfer sit to/from stand: with modified independence Goal: Pt Will Transfer Bed To Chair/Chair To Bed Outcome: Progressing Flowsheets (Taken 09/25/2022 1453) Pt will Transfer Bed to Chair/Chair to Bed: with modified independence Goal: Pt Will Ambulate Outcome: Progressing Flowsheets (Taken 09/25/2022 1453) Pt will Ambulate:  100 feet  with least restrictive assistive device  with modified independence  with supervision   3:05 PM, 09/25/22 Ocie Bob, MPT Physical Therapist with [facility] [phone] office [phone] mobile phone

## 2022-09-26 DIAGNOSIS — N1330 Unspecified hydronephrosis: Secondary | ICD-10-CM

## 2022-09-26 DIAGNOSIS — E1121 Type 2 diabetes mellitus with diabetic nephropathy: Secondary | ICD-10-CM

## 2022-09-26 DIAGNOSIS — C9000 Multiple myeloma not having achieved remission: Secondary | ICD-10-CM

## 2022-09-26 DIAGNOSIS — N201 Calculus of ureter: Secondary | ICD-10-CM | POA: Diagnosis not present

## 2022-09-26 LAB — BASIC METABOLIC PANEL
Anion gap: 9 (ref 5–15)
BUN: 18 mg/dL (ref 8–23)
CO2: 25 mmol/L (ref 22–32)
Calcium: 9.5 mg/dL (ref 8.9–10.3)
Chloride: 103 mmol/L (ref 98–111)
Creatinine, Ser: 1.43 mg/dL — ABNORMAL HIGH (ref 0.44–1.00)
GFR, Estimated: 38 mL/min — ABNORMAL LOW (ref >60)
Glucose, Bld: 90 mg/dL (ref 70–99)
Potassium: 3.5 mmol/L (ref 3.5–5.1)
Sodium: 137 mmol/L (ref 135–145)

## 2022-09-26 LAB — CBC
HCT: 33.9 % — ABNORMAL LOW (ref 36.0–46.0)
Hemoglobin: 10.7 g/dL — ABNORMAL LOW (ref 12.0–15.0)
MCH: 29.4 pg (ref 26.0–34.0)
MCHC: 31.6 g/dL (ref 30.0–36.0)
MCV: 93.1 fL (ref 80.0–100.0)
Platelets: 371 10*3/uL (ref 150–400)
RBC: 3.64 MIL/uL — ABNORMAL LOW (ref 3.87–5.11)
RDW: 16.6 % — ABNORMAL HIGH (ref 11.5–15.5)
WBC: 13.7 10*3/uL — ABNORMAL HIGH (ref 4.0–10.5)
nRBC: 0 % (ref 0.0–0.2)

## 2022-09-26 LAB — URINALYSIS, COMPLETE (UACMP) WITH MICROSCOPIC
Bilirubin Urine: NEGATIVE
Glucose, UA: NEGATIVE mg/dL
Ketones, ur: NEGATIVE mg/dL
Nitrite: NEGATIVE
Protein, ur: 100 mg/dL — AB
Specific Gravity, Urine: 1.011 (ref 1.005–1.030)
WBC, UA: >50 WBC/hpf (ref 0–5)
pH: 6 (ref 5.0–8.0)

## 2022-09-26 LAB — GLUCOSE, CAPILLARY
Glucose-Capillary: 95 mg/dL (ref 70–99)
Glucose-Capillary: 92 mg/dL (ref 70–99)
Glucose-Capillary: 104 mg/dL — ABNORMAL HIGH (ref 70–99)
Glucose-Capillary: 98 mg/dL (ref 70–99)

## 2022-09-26 LAB — T4, FREE: Free T4: 1.04 ng/dL (ref 0.61–1.12)

## 2022-09-26 LAB — VITAMIN B12: Vitamin B-12: 206 pg/mL (ref 180–914)

## 2022-09-26 LAB — MAGNESIUM: Magnesium: 1.9 mg/dL (ref 1.7–2.4)

## 2022-09-26 LAB — CULTURE, BLOOD (ROUTINE X 2)

## 2022-09-26 LAB — FOLATE: Folate: 4.1 ng/mL — ABNORMAL LOW (ref >5.9)

## 2022-09-26 LAB — TSH: TSH: 2.918 u[IU]/mL (ref 0.350–4.500)

## 2022-09-26 LAB — CK: Total CK: 459 U/L — ABNORMAL HIGH (ref 38–234)

## 2022-09-26 MED ORDER — VANCOMYCIN HCL 750 MG/150ML IV SOLN: 750.0000 mg | INTRAVENOUS | Status: DC

## 2022-09-26 MED ORDER — VANCOMYCIN HCL 1500 MG/300ML IV SOLN
1500.0000 mg | Freq: Once | INTRAVENOUS | Status: DC
  Filled 2022-09-26: qty 300

## 2022-09-26 NOTE — Progress Notes (Addendum)
Pharmacy Antibiotic Note  Cheryl Abbott is a 77 y.o. female admitted on 09/23/2022 with UTI.  Pharmacy has been consulted for vancomycin dosing.  Patient has hx of UTI, CKD Stage III, HTN, Hyperlipidemia, T2DM. Labs today for basic metabolic panel are WNL expect Scr 1.43 and GFR 38. Temp 98.1 F, RR 22, BP 137/92. Suspected complicated UTI with obstructed stone in right ureter  Plan: Vancomycin loading dose: 1500 mg X 1 dose, then  Vancomycin 750 mg IV Q 24 hrs. Goal AUC 400-550. Expected AUC: 422.3 SCr used: 1.43  F/U cxs and clinical progress Monitor V/S, labs and levels as indicated  Height: 5' 6.5" (168.9 cm) Weight: 82 kg (180 lb 12.4 oz) IBW/kg (Calculated) : 60.45 Adj BW: 69.07 kg  Temp (24hrs), Avg:98.5 F (36.9 C), Min:98.1 F (36.7 C), Max:98.7 F (37.1 C)  Recent Labs  Lab 09/23/22 0759 09/23/22 0909 09/24/22 0517 09/25/22 0417 09/26/22 0408  WBC 12.8*  --  11.3* 12.3* 13.7*  CREATININE 1.53*  --  1.30* 1.32* 1.43*  LATICACIDVEN  --  1.0  --   --   --     Estimated Creatinine Clearance: 35.4 mL/min (A) (by C-G formula based on SCr of 1.43 mg/dL (H)).    Allergies  Allergen Reactions   Ace Inhibitors Cough    Patient doesn't recall  Other Reaction(s): GI Intolerance   Cephalexin Diarrhea and Nausea And Vomiting    Other Reaction(s): GI Intolerance   Nitrofurantoin Diarrhea and Nausea And Vomiting   Penicillins Hives and Other (See Comments)    Antimicrobials this admission: Vanc 4/17 >>  Ceftriaxone 4/15>>  Microbiology results: 4/15 BCX: ngtd 09/23/22 UCx: Positive for Corynebacterium Species   Thank you for allowing pharmacy to be a part of this patient's care.  Nil Patel 09/26/2022 10:40 AM  Reviewed/updated  Elder Cyphers, BS Pharm D, BCPS Clinical Pharmacist

## 2022-09-26 NOTE — Hospital Course (Signed)
Cheryl Abbott is an 78 y.o. female with a past medical history of stage III CKD, chronic shoulder pain, chronic back pain, osteoarthritis, anxiety disorder, hypertension, hyperlipidemia, type 2 diabetes, nephrolithiasis and UTIs who presented to the ED today for evaluation of weakness and falls at home.  Patient reportedly had a fall today where her legs gave out on her.  She lives alone and was brought to the ED via EMS.  She endorses some upper respiratory congestion and a nonproductive cough as well as some nausea.  She has chronic shoulder pain but it is worse since the fall.  She also reports that she hit her knees and her head during her fall.    She was noted to be mildly hypertensive, tachycardic and tachypneic on arrival. Labs are notable for a WBC of 12.8, hemoglobin 11.4, sodium 134, glucose 108, creatinine 1.53/GFR 35 (consistent with her usual baseline renal function), COVID-negative, CK2 110, lactic acid 1.0, urinalysis with a large amount of leukocytes and culture pending. Chest x-ray was negative. CT of the head was stable with no acute findings. CT of the lumbar spine showed no acute fracture or cause for her weakness. She did have arthropathy and some foraminal narrowing but no frank spinal stenosis. CT of the abdomen and pelvis showed moderate to severe right hydroureteronephrosis with a 6 mm calculus in the right ureter. Because of these findings, the ED physician consulted urology who recommended admission for IV antibiotics and plan for stent placement.   Patient underwent cystoscopy, right JJ stent placement by Dr. Ronne Binning 4/15.

## 2022-09-26 NOTE — Progress Notes (Signed)
Patient slept through the night tramadol given once for pain. No further complaints continued to monitor.

## 2022-09-26 NOTE — Progress Notes (Signed)
Mobility Specialist Progress Note:    09/26/22 0952  Mobility  Activity Transferred from bed to chair  Level of Assistance Minimal assist, patient does 75% or more  Assistive Device Front wheel walker  Distance Ambulated (ft) 4 ft  Activity Response Tolerated well  Mobility Referral Yes  $Mobility charge 1 Mobility   Pt agreeable to mobility session. Received in bed, transferred to chair. Tolerated well, c/o pain in stomach and arms. Nurse notified. Left pt in room with nurse, all needs met, chair alarm on.   Feliciana Rossetti Mobility Specialist Please contact via Special educational needs teacher or  Rehab office at [phone]

## 2022-09-26 NOTE — Progress Notes (Signed)
PROGRESS NOTE  Cheryl Abbott:[id number] DOB: 11-18-1945 DOA: 09/23/2022 PCP: Kerri Perches, MD  Brief History:  Cheryl Abbott is an 77 y.o. female with a past medical history of stage III CKD, chronic shoulder pain, chronic back pain, osteoarthritis, anxiety disorder, hypertension, hyperlipidemia, type 2 diabetes, nephrolithiasis and UTIs who presented to the ED today for evaluation of weakness and falls at home.  Patient reportedly had a fall today where her legs gave out on her.  She lives alone and was brought to the ED via EMS.  She endorses some upper respiratory congestion and a nonproductive cough as well as some nausea.  She has chronic shoulder pain but it is worse since the fall.  She also reports that she hit her knees and her head during her fall.    She was noted to be mildly hypertensive, tachycardic and tachypneic on arrival. Labs are notable for a WBC of 12.8, hemoglobin 11.4, sodium 134, glucose 108, creatinine 1.53/GFR 35 (consistent with her usual baseline renal function), COVID-negative, CK2 110, lactic acid 1.0, urinalysis with a large amount of leukocytes and culture pending. Chest x-ray was negative. CT of the head was stable with no acute findings. CT of the lumbar spine showed no acute fracture or cause for her weakness. She did have arthropathy and some foraminal narrowing but no frank spinal stenosis. CT of the abdomen and pelvis showed moderate to severe right hydroureteronephrosis with a 6 mm calculus in the right ureter. Because of these findings, the ED physician consulted urology who recommended admission for IV antibiotics and plan for stent placement.   Patient underwent cystoscopy, right JJ stent placement by Dr. Ronne Binning 4/15.   Assessment/Plan: Sepsis -present on admission -due to complicated UTI with obstructed stone in Right ureter -presented with leukocytosis and tachycardia -blood cultures neg to date -continue ceftriaxone -WBC  gradually increasing  Complicated UTI with ureteral calculus/R-hydronephrosis -add vancomycin to cover Corynebacterium urealyticum with increasing WBC -continue ceftriaxone for now -appreciate Dr. 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  Denies n/v/d.  Denies f/c, cp, sob.  Has some dysuria  Objective: Vitals:   09/25/22 2011 09/25/22 2055 09/26/22 0343 09/26/22 0714  BP:  (!) 117/96 (!) 137/92   Pulse:  93 93   Resp:  20 (!) 22   Temp:  98.7 F (37.1 C) 98.1 F (36.7 C)   TempSrc:  Oral    SpO2: 90% 93% 97% 98%  Weight:      Height:        Intake/Output Summary (Last 24 hours) at 09/26/2022 1032 Last data filed at 09/26/2022 0900 Gross per  24 hour  Intake 1240 ml  Output 2850 ml  Net -1610 ml   Weight change:  Exam:  General:  Pt is alert, follows commands appropriately, not in acute distress HEENT: No icterus, No thrush, No neck mass, [place]/AT Cardiovascular: RRR, S1/S2, no rubs, no gallops Respiratory: bibasilar rales.  No wheeze Abdomen: Soft/+BS, non tender, non distended, no guarding Extremities: trace LE edema, No lymphangitis, No petechiae, No rashes, no  synovitis   Data Reviewed: I have personally reviewed following labs and imaging studies Basic Metabolic Panel: Recent Labs  Lab 09/23/22 0759 09/24/22 0517 09/25/22 0417 09/26/22 0408  NA 134* 136 134* 137  K 3.5 3.3* 4.3 3.5  CL 101 103 104 103  CO2 25 25 25 25   GLUCOSE 108* 94 128* 90  BUN 15 11 14 18   CREATININE 1.53* 1.30* 1.32* 1.43*  CALCIUM 9.4 9.4 9.9 9.5   Liver Function Tests: Recent Labs  Lab 09/23/22 0759  AST 18  ALT 15  ALKPHOS 71  BILITOT 0.3  PROT 8.5*  ALBUMIN 3.4*   No results for input(s): "LIPASE", "AMYLASE" in the last 168 hours. No results for input(s): "AMMONIA" in the last 168 hours. Coagulation Profile: No results for input(s): "INR", "PROTIME" in the last 168 hours. CBC: Recent Labs  Lab 09/23/22 0759 09/24/22 0517 09/25/22 0417 09/26/22 0408  WBC 12.8* 11.3* 12.3* 13.7*  NEUTROABS 10.5*  --   --   --   HGB 11.4* 11.2* 11.2* 10.7*  HCT 35.0* 35.5* 35.2* 33.9*  MCV 91.6 92.9 93.1 93.1  PLT 304 320 344 371   Cardiac Enzymes: Recent Labs  Lab 09/23/22 0830  CKTOTAL 210   BNP: Invalid input(s): "POCBNP" CBG: Recent Labs  Lab 09/25/22 0659 09/25/22 1100 09/25/22 1647 09/25/22 2058 09/26/22 0808  GLUCAP 165* 231* 95 98 95   HbA1C: No results for input(s): "HGBA1C" in the last 72 hours. Urine analysis:    Component Value Date/Time   COLORURINE YELLOW 09/23/2022 1109   APPEARANCEUR HAZY (A) 09/23/2022 1109   APPEARANCEUR Clear 01/11/2022 1408   LABSPEC 1.006 09/23/2022 1109   PHURINE 8.0 09/23/2022 1109   GLUCOSEU NEGATIVE 09/23/2022 1109   HGBUR MODERATE (A) 09/23/2022 1109   HGBUR moderate 02/07/2010 0907   BILIRUBINUR NEGATIVE 09/23/2022 1109   BILIRUBINUR Negative 01/11/2022 1408   KETONESUR NEGATIVE 09/23/2022 1109   PROTEINUR 30 (A) 09/23/2022 1109   UROBILINOGEN 0.2 09/06/2015 1401   UROBILINOGEN 0.2 03/01/2015 1600   NITRITE NEGATIVE 09/23/2022 1109   LEUKOCYTESUR LARGE (A) 09/23/2022 1109   Sepsis  Labs: @LABRCNTIP (procalcitonin:4,lacticidven:4) ) Recent Results (from the past 240 hour(s))  SARS Coronavirus 2 by RT PCR (hospital order, performed in [facility] hospital lab) *cepheid single result test* Anterior Nasal Swab     Status: None   Collection Time: 09/23/22  8:03 AM   Specimen: Anterior Nasal Swab  Result Value Ref Range Status   SARS Coronavirus 2 by RT PCR NEGATIVE NEGATIVE Final    Comment: (NOTE) SARS-CoV-2 target nucleic acids are NOT DETECTED.  The SARS-CoV-2 RNA is generally detectable in upper and lower respiratory specimens during the acute phase of infection. The lowest concentration of SARS-CoV-2 viral copies this assay can detect is 250 copies / mL. A negative result does not preclude SARS-CoV-2 infection and should not be used as the sole basis for treatment or other patient management decisions.  A negative result may occur with improper specimen collection / handling, submission of specimen other than nasopharyngeal swab, presence of viral  mutation(s) within the areas targeted by this assay, and inadequate number of viral copies (<250 copies / mL). A negative result must be combined with clinical observations, patient history, and epidemiological information.  Fact Sheet for Patients:   RoadLapTop.co.za  Fact Sheet for Healthcare Providers: http://kim-miller.com/  This test is not yet approved or  cleared by the [place] FDA and has been authorized for detection and/or diagnosis of SARS-CoV-2 by FDA under an Emergency Use Authorization (EUA).  This EUA will remain in effect (meaning this test can be used) for the duration of the COVID-19 declaration under Section 564(b)(1) of the Act, 21 U.S.C. section 360bbb-3(b)(1), unless the authorization is terminated or revoked sooner.  Performed at [facility], [address] [place], [place] [address]   Urine Culture     Status: Abnormal   Collection Time:  09/23/22 11:09 AM   Specimen: Urine, Clean Catch  Result Value Ref Range Status   Specimen Description   Final    URINE, CLEAN CATCH Performed at [facility], [address], [place], [place] [address]    Special Requests   Final    NONE Performed at [facility], [address], [place], [place] [address]    Culture (A)  Final    40,000 COLONIES/mL DIPHTHEROIDS(CORYNEBACTERIUM SPECIES) Standardized susceptibility testing for this organism is not available. Performed at [facility], [address] [place], [place] [address]    Report Status 09/25/2022 FINAL  Final  Culture, blood (Routine X 2) w Reflex to ID Panel     Status: None (Preliminary result)   Collection Time: 09/24/22  5:57 PM   Specimen: BLOOD RIGHT HAND  Result Value Ref Range Status   Specimen Description BLOOD RIGHT HAND  Final   Special Requests   Final    BOTTLES DRAWN AEROBIC AND ANAEROBIC Blood Culture results may not be optimal due to an excessive volume of blood received in culture bottles   Culture   Final    NO GROWTH 2 DAYS Performed at [facility], [address] [place], [place] [address]    Report Status PENDING  Incomplete  Culture, blood (Routine X 2) w Reflex to ID Panel     Status: None (Preliminary result)   Collection Time: 09/24/22  5:57 PM   Specimen: BLOOD LEFT HAND  Result Value Ref Range Status   Specimen Description BLOOD LEFT HAND AEROBIC BOTTLE ONLY  Final   Special Requests   Final    Blood Culture results may not be optimal due to an excessive volume of blood received in culture bottles   Culture   Final    NO GROWTH 2 DAYS Performed at [facility], [address] [place], [place] [address]    Report Status PENDING  Incomplete     Scheduled Meds:  amLODipine  10 mg Oral Daily   Chlorhexidine Gluconate Cloth  6 each Topical Daily   enoxaparin (LOVENOX) injection  40 mg Subcutaneous Q24H   insulin aspart  0-15 Units Subcutaneous TID WC   insulin aspart  0-5 Units  Subcutaneous QHS   isosorbide mononitrate  15 mg Oral Daily   linagliptin  5 mg Oral Daily   mometasone-formoterol  2 puff Inhalation BID   pantoprazole  40 mg Oral BID   PARoxetine  40 mg Oral q AM   rosuvastatin  40 mg Oral Daily   Continuous Infusions:  cefTRIAXone (ROCEPHIN)  IV 1 g (09/26/22 0947)    Procedures/Studies: DG C-Arm 1-60 Min-No  Report  Result Date: 09/24/2022 Fluoroscopy was utilized by the requesting physician.  No radiographic interpretation.   CT L-SPINE NO CHARGE  Result Date: 09/23/2022 CLINICAL DATA:  Fall, back pain EXAM: CT LUMBAR SPINE WITHOUT CONTRAST TECHNIQUE: Multidetector CT imaging of the lumbar spine was performed without intravenous contrast administration. Multiplanar CT image reconstructions were also generated. RADIATION DOSE REDUCTION: This exam was performed according to the departmental dose-optimization program which includes automated exposure control, adjustment of the mA and/or kV according to patient size and/or use of iterative reconstruction technique. COMPARISON:  02/13/2022 FINDINGS: Segmentation: 5 lumbar type vertebrae. Alignment: Normal. Vertebrae: No acute fracture or focal pathologic process. Paraspinal and other soft tissues: See dedicated abdominopelvic CT report for assessment of the intra-abdominal findings. No paravertebral inflammatory changes or fluid collections. Disc levels: Intervertebral disc heights are preserved. Moderate bilateral facet arthropathy most pronounced at L3-L4 and L4-L5. Foraminal narrowing appears most pronounced at L4-L5 on the right. No evidence to suggest high-grade canal stenosis by CT. IMPRESSION: 1. No acute fracture or traumatic malalignment of the lumbar spine. 2. Moderate bilateral facet arthropathy most pronounced at L3-L4 and L4-L5. Foraminal narrowing appears most pronounced at L4-L5 on the right. Electronically Signed   By: Duanne Guess D.O.   On: 09/23/2022 13:52   CT ABDOMEN PELVIS W  CONTRAST  Result Date: 09/23/2022 CLINICAL DATA:  Weakness, pyelonephritis. EXAM: CT ABDOMEN AND PELVIS WITH CONTRAST TECHNIQUE: Multidetector CT imaging of the abdomen and pelvis was performed using the standard protocol following bolus administration of intravenous contrast. RADIATION DOSE REDUCTION: This exam was performed according to the departmental dose-optimization program which includes automated exposure control, adjustment of the mA and/or kV according to patient size and/or use of iterative reconstruction technique. CONTRAST:  80mL OMNIPAQUE IOHEXOL 300 MG/ML  SOLN COMPARISON:  February 13, 2022. FINDINGS: Lower chest: Minimal bibasilar subsegmental atelectasis. Hepatobiliary: No focal liver abnormality is seen. No gallstones, gallbladder wall thickening, or biliary dilatation. Pancreas: Unremarkable. No pancreatic ductal dilatation or surrounding inflammatory changes. Spleen: Normal in size without focal abnormality. Adrenals/Urinary Tract: Adrenal glands appear normal. Mild nonobstructive left nephrolithiasis. Minimal left hydroureteronephrosis is noted without obstructing calculus. Moderate to severe right hydroureteronephrosis is noted secondary to 6 mm calculus in midportion of right ureter. Urinary bladder is unremarkable. Stomach/Bowel: Stomach is within normal limits. Appendix appears normal. No evidence of bowel wall thickening, distention, or inflammatory changes. Diverticulosis of descending and sigmoid colon is noted without inflammation. Vascular/Lymphatic: Aortic atherosclerosis. No enlarged abdominal or pelvic lymph nodes. Reproductive: Status post hysterectomy. No adnexal masses. Other: No abdominal wall hernia or abnormality. No abdominopelvic ascites. Musculoskeletal: No acute or significant osseous findings. IMPRESSION: Moderate to severe right hydroureteronephrosis is noted secondary to 6 mm calculus in midportion of right ureter. Nonobstructive left nephrolithiasis. Minimal left  hydronephrosis is noted without obstructing calculus. Diverticulosis of descending and sigmoid colon without inflammation. Aortic Atherosclerosis (ICD10-I70.0). Electronically Signed   By: Lupita Raider M.D.   On: 09/23/2022 13:50   DG Chest Portable 1 View  Result Date: 09/23/2022 CLINICAL DATA:  Altered mental status, generalized weakness. EXAM: PORTABLE CHEST 1 VIEW COMPARISON:  Chest x-rays dated 03/21/2022 and 06/07/2021. FINDINGS: Heart size and mediastinal contours are stable. Lungs are clear. No pleural effusion or pneumothorax is seen. Chronic elevation of the RIGHT hemidiaphragm. Osseous structures about the chest are unremarkable. IMPRESSION: No active disease. No evidence of pneumonia or pulmonary edema. Electronically Signed   By: Bary Richard M.D.   On: 09/23/2022 09:09   CT Head  Wo Contrast  Result Date: 09/23/2022 CLINICAL DATA:  Mental status change with unknown cause. EXAM: CT HEAD WITHOUT CONTRAST TECHNIQUE: Contiguous axial images were obtained from the base of the skull through the vertex without intravenous contrast. RADIATION DOSE REDUCTION: This exam was performed according to the departmental dose-optimization program which includes automated exposure control, adjustment of the mA and/or kV according to patient size and/or use of iterative reconstruction technique. COMPARISON:  03/07/2016 FINDINGS: Brain: No evidence of acute infarction, hemorrhage, hydrocephalus, extra-axial collection or mass lesion/mass effect. Generalized cerebral volume loss with mild chronic small vessel ischemia. Vascular: No hyperdense vessel or unexpected calcification. Skull: Normal. Negative for fracture or focal lesion. Sinuses/Orbits: Negative IMPRESSION: No acute finding.  Stable head CT when compared to 2017. Electronically Signed   By: Tiburcio Pea M.D.   On: 09/23/2022 09:05    Catarina Hartshorn, DO  [facility]  If 7PM-7AM, please contact night-coverage [email or web address] Password  [email or web address] 09/26/2022, 10:32 AM   LOS: 3 days

## 2022-09-27 DIAGNOSIS — E669 Obesity, unspecified: Secondary | ICD-10-CM

## 2022-09-27 DIAGNOSIS — A419 Sepsis, unspecified organism: Secondary | ICD-10-CM | POA: Diagnosis not present

## 2022-09-27 DIAGNOSIS — N132 Hydronephrosis with renal and ureteral calculous obstruction: Secondary | ICD-10-CM | POA: Diagnosis not present

## 2022-09-27 LAB — BASIC METABOLIC PANEL
Anion gap: 9 (ref 5–15)
BUN: 15 mg/dL (ref 8–23)
CO2: 26 mmol/L (ref 22–32)
Calcium: 9.6 mg/dL (ref 8.9–10.3)
Chloride: 102 mmol/L (ref 98–111)
Creatinine, Ser: 1.45 mg/dL — ABNORMAL HIGH (ref 0.44–1.00)
GFR, Estimated: 37 mL/min — ABNORMAL LOW (ref >60)
Glucose, Bld: 89 mg/dL (ref 70–99)
Potassium: 3.5 mmol/L (ref 3.5–5.1)
Sodium: 137 mmol/L (ref 135–145)

## 2022-09-27 LAB — CBC
HCT: 35.1 % — ABNORMAL LOW (ref 36.0–46.0)
Hemoglobin: 11.1 g/dL — ABNORMAL LOW (ref 12.0–15.0)
MCH: 29.1 pg (ref 26.0–34.0)
MCHC: 31.6 g/dL (ref 30.0–36.0)
MCV: 92.1 fL (ref 80.0–100.0)
Platelets: 382 10*3/uL (ref 150–400)
RBC: 3.81 MIL/uL — ABNORMAL LOW (ref 3.87–5.11)
RDW: 16.7 % — ABNORMAL HIGH (ref 11.5–15.5)
WBC: 10.3 10*3/uL (ref 4.0–10.5)
nRBC: 0 % (ref 0.0–0.2)

## 2022-09-27 LAB — GLUCOSE, CAPILLARY
Glucose-Capillary: 105 mg/dL — ABNORMAL HIGH (ref 70–99)
Glucose-Capillary: 100 mg/dL — ABNORMAL HIGH (ref 70–99)

## 2022-09-27 LAB — URINE CULTURE: Culture: NO GROWTH

## 2022-09-27 LAB — MAGNESIUM: Magnesium: 1.9 mg/dL (ref 1.7–2.4)

## 2022-09-27 LAB — CULTURE, BLOOD (ROUTINE X 2): Culture: NO GROWTH

## 2022-09-27 MED ORDER — CYANOCOBALAMIN 500 MCG PO TABS: 500.0000 ug | ORAL_TABLET | Freq: Every day | ORAL | Status: AC

## 2022-09-27 MED ORDER — FOLIC ACID 1 MG PO TABS
1.0000 mg | ORAL_TABLET | Freq: Every day | ORAL | Status: DC
  Administered 2022-09-27: 1 mg via ORAL
  Filled 2022-09-27: qty 1

## 2022-09-27 MED ORDER — CEFDINIR 300 MG PO CAPS: 300.0000 mg | ORAL_CAPSULE | Freq: Two times a day (BID) | ORAL | Status: DC

## 2022-09-27 MED ORDER — CEFDINIR 300 MG PO CAPS: 300.0000 mg | ORAL_CAPSULE | Freq: Two times a day (BID) | ORAL | 0 refills | Status: DC

## 2022-09-27 MED ORDER — VITAMIN B-12 100 MCG PO TABS
500.0000 ug | ORAL_TABLET | Freq: Every day | ORAL | Status: DC
  Administered 2022-09-27: 500 ug via ORAL
  Filled 2022-09-27: qty 5

## 2022-09-27 MED ORDER — FOLIC ACID 1 MG PO TABS: 1.0000 mg | ORAL_TABLET | Freq: Every day | ORAL | Status: AC

## 2022-09-27 NOTE — Progress Notes (Signed)
Patient received Tramadol and gabapentin this shift. She had a bowel movement and slept the rest of the night. Continued to monitor.

## 2022-09-27 NOTE — Discharge Summary (Addendum)
Physician Discharge Summary   Patient: Cheryl Abbott MRN: [id number] DOB: 1946-05-11  Admit date:     09/23/2022  Discharge date: 09/27/22  Discharge Physician: Onalee Hua Alucard Fearnow   PCP: Kerri Perches, MD   Recommendations at discharge:   Please follow up with primary care provider within 1-2 weeks  Please repeat BMP and CBC in one week    Hospital Course: Cheryl Abbott is an 77 y.o. female with a past medical history of stage III CKD, chronic shoulder pain, chronic back pain, osteoarthritis, anxiety disorder, hypertension, hyperlipidemia, type 2 diabetes, nephrolithiasis and UTIs who presented to the ED today for evaluation of weakness and falls at home.  Patient reportedly had a fall today where her legs gave out on her.  She lives alone and was brought to the ED via EMS.  She endorses some upper respiratory congestion and a nonproductive cough as well as some nausea.  She has chronic shoulder pain but it is worse since the fall.  She also reports that she hit her knees and her head during her fall.    She was noted to be mildly hypertensive, tachycardic and tachypneic on arrival. Labs are notable for a WBC of 12.8, hemoglobin 11.4, sodium 134, glucose 108, creatinine 1.53/GFR 35 (consistent with her usual baseline renal function), COVID-negative, CK2 110, lactic acid 1.0, urinalysis with a large amount of leukocytes and culture pending. Chest x-ray was negative. CT of the head was stable with no acute findings. CT of the lumbar spine showed no acute fracture or cause for her weakness. She did have arthropathy and some foraminal narrowing but no frank spinal stenosis. CT of the abdomen and pelvis showed moderate to severe right hydroureteronephrosis with a 6 mm calculus in the right ureter. Because of these findings, the ED physician consulted urology who recommended admission for IV antibiotics and plan for stent placement.   Patient underwent cystoscopy, right JJ stent placement by Dr.  Ronne Binning 4/15.  The patient gradually improved clinically.  Her abd and back pain improved and generalized weakness also improved.  Her WBC improved and she remained afebrile and hemodynamically stable.  She will d/c home with 6 more days cefdinir  Assessment and Plan: Sepsis -present on admission -due to complicated UTI with obstructed stone in Right ureter -presented with leukocytosis and tachycardia -blood cultures neg to date -continue ceftriaxone -WBC gradually increasing   Complicated UTI with ureteral calculus/R-hydronephrosis -urine culture Corynebacterium sp>>unclear significance at pt is not immunocompromised and she is clinically improving with normalized WBC and is afebrile and hemodynamically stable -continue ceftriaxone for now -appreciate Dr. Ronne Binning -s/p JJ stent on 09/24/22 -outpt follow up with Dr. 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 after nephrology work-up found M spike in urine (8.3 mg per 24 hours) and serum (1.5 g/dL) - managed by observation at [facility] -due for skeletal survey June 2024        Consultants: urology--McKenzie Procedures performed: JJ stent right ureter  Disposition: Home Diet recommendation:  Carb modified diet DISCHARGE MEDICATION: Allergies as  of 09/27/2022       Reactions   Ace Inhibitors Cough   Patient doesn't recall Other Reaction(s): GI Intolerance   Cephalexin Diarrhea, Nausea And Vomiting   Other Reaction(s): GI Intolerance    Nitrofurantoin Diarrhea, Nausea And Vomiting   Penicillins Hives, Other (See Comments)        Medication List     STOP taking these medications    Trelegy Ellipta 100-62.5-25 MCG/ACT Aepb Generic drug: Fluticasone-Umeclidin-Vilant   triamcinolone cream 0.1 % Commonly known as: KENALOG       TAKE these medications    acetaminophen 500 MG tablet Commonly known as: TYLENOL Take 2 tablets (1,000 mg total) by mouth every 6 (six) hours as needed for mild pain.   amLODipine 10 MG tablet Commonly known as: NORVASC TAKE 1 TABLET BY MOUTH EVERY DAY   aspirin EC 81 MG tablet Take 81 mg by mouth daily. Swallow whole.   cefdinir 300 MG capsule Commonly known as: OMNICEF Take 1 capsule (300 mg total) by mouth every 12 (twelve) hours.   cyanocobalamin 500 MCG tablet Commonly known as: VITAMIN B12 Take 1 tablet (500 mcg total) by mouth daily. Start taking on: September 28, 2022   diclofenac Sodium 1 % Gel Commonly known as: VOLTAREN APPLY 4 G TOPICALLY 4 (FOUR) TIMES DAILY AS NEEDED (PAIN).   diphenhydramine-acetaminophen 25-500 MG Tabs tablet Commonly known as: TYLENOL PM Take 2 tablets by mouth at bedtime.   folic acid 1 MG tablet Commonly known as: FOLVITE Take 1 tablet (1 mg total) by mouth daily. Start taking on: September 28, 2022   FreeStyle Libre 2 Reader Hardie Pulley Use to test daily. Dx: E11.21   FreeStyle Libre 2 Sensor Misc Use to test daily dx: E11.21   gabapentin 300 MG capsule Commonly known as: NEURONTIN TAKE 1 CAPSULE BY MOUTH 3 TIMES DAILY AS NEEDED. What changed:  how much to take how to take this when to take this reasons to take this   isosorbide mononitrate 30 MG 24 hr tablet Commonly known as: IMDUR Take 0.5 tablets (15 mg total) by mouth daily.   nitroGLYCERIN 0.4 MG SL tablet Commonly known as: NITROSTAT Place 1 tablet (0.4 mg total) under the tongue every 5 (five) minutes as needed for chest pain. Up to 3 tablets, then call 911.   OneTouch  Delica Lancets 30G Misc Once daily testing dx e11.9   OneTouch Ultra test strip Generic drug: glucose blood USE AS DIRECTED   pantoprazole 40 MG tablet Commonly known as: PROTONIX TAKE 1 TABLET BY MOUTH TWICE A DAY   PARoxetine 40 MG tablet Commonly known as: PAXIL TAKE 1 TABLET (40 MG TOTAL) BY MOUTH IN THE MORNING   rosuvastatin 40 MG tablet Commonly known as: CRESTOR Take 1 tablet (40 mg total) by mouth daily.   Symbicort 80-4.5 MCG/ACT inhaler Generic drug: budesonide-formoterol INHALE 2 PUFFS INTO THE LUNGS TWICE A DAY What changed: See the new instructions.   Tradjenta 5 MG Tabs tablet Generic drug: linagliptin TAKE 1 TABLET BY MOUTH EVERY DAY   traMADol 50 MG tablet Commonly known as: ULTRAM Take 1 tablet (50 mg total) by mouth every 6 (six) hours as needed. What changed: reasons to take this   Trulicity 0.75 MG/0.5ML Sopn Generic drug: Dulaglutide INJECT 0.75 MG INTO THE SKIN WEEKLY What changed: See the new instructions.   UNABLE TO FIND Diabetic shoes x 1 pair, inserts x 3 pair  DX E11.9        Follow-up Information  [facility] Follow up.   Why: Will contact you to schedule home health visits.        McKenzie, Mardene Celeste, MD. Schedule an appointment as soon as possible for a visit in 1 week(s).   Specialty: Urology Contact information: [address]  [place] [address] [phone]                Discharge Exam: Ceasar Mons Weights   09/23/22 0747  Weight: 82 kg   HEENT:  [place]/AT, No thrush, no icterus CV:  RRR, no rub, no S3, no S4 Lung:  CTA, no wheeze, no rhonchi Abd:  soft/+BS, NT Ext:  No edema, no lymphangitis, no synovitis, no rash   Condition at discharge: stable  The results of significant diagnostics from this hospitalization (including imaging, microbiology, ancillary and laboratory) are listed below for reference.   Imaging Studies: DG C-Arm 1-60 Min-No Report  Result Date:  09/24/2022 Fluoroscopy was utilized by the requesting physician.  No radiographic interpretation.   CT L-SPINE NO CHARGE  Result Date: 09/23/2022 CLINICAL DATA:  Fall, back pain EXAM: CT LUMBAR SPINE WITHOUT CONTRAST TECHNIQUE: Multidetector CT imaging of the lumbar spine was performed without intravenous contrast administration. Multiplanar CT image reconstructions were also generated. RADIATION DOSE REDUCTION: This exam was performed according to the departmental dose-optimization program which includes automated exposure control, adjustment of the mA and/or kV according to patient size and/or use of iterative reconstruction technique. COMPARISON:  02/13/2022 FINDINGS: Segmentation: 5 lumbar type vertebrae. Alignment: Normal. Vertebrae: No acute fracture or focal pathologic process. Paraspinal and other soft tissues: See dedicated abdominopelvic CT report for assessment of the intra-abdominal findings. No paravertebral inflammatory changes or fluid collections. Disc levels: Intervertebral disc heights are preserved. Moderate bilateral facet arthropathy most pronounced at L3-L4 and L4-L5. Foraminal narrowing appears most pronounced at L4-L5 on the right. No evidence to suggest high-grade canal stenosis by CT. IMPRESSION: 1. No acute fracture or traumatic malalignment of the lumbar spine. 2. Moderate bilateral facet arthropathy most pronounced at L3-L4 and L4-L5. Foraminal narrowing appears most pronounced at L4-L5 on the right. Electronically Signed   By: Duanne Guess D.O.   On: 09/23/2022 13:52   CT ABDOMEN PELVIS W CONTRAST  Result Date: 09/23/2022 CLINICAL DATA:  Weakness, pyelonephritis. EXAM: CT ABDOMEN AND PELVIS WITH CONTRAST TECHNIQUE: Multidetector CT imaging of the abdomen and pelvis was performed using the standard protocol following bolus administration of intravenous contrast. RADIATION DOSE REDUCTION: This exam was performed according to the departmental dose-optimization program which  includes automated exposure control, adjustment of the mA and/or kV according to patient size and/or use of iterative reconstruction technique. CONTRAST:  80mL OMNIPAQUE IOHEXOL 300 MG/ML  SOLN COMPARISON:  February 13, 2022. FINDINGS: Lower chest: Minimal bibasilar subsegmental atelectasis. Hepatobiliary: No focal liver abnormality is seen. No gallstones, gallbladder wall thickening, or biliary dilatation. Pancreas: Unremarkable. No pancreatic ductal dilatation or surrounding inflammatory changes. Spleen: Normal in size without focal abnormality. Adrenals/Urinary Tract: Adrenal glands appear normal. Mild nonobstructive left nephrolithiasis. Minimal left hydroureteronephrosis is noted without obstructing calculus. Moderate to severe right hydroureteronephrosis is noted secondary to 6 mm calculus in midportion of right ureter. Urinary bladder is unremarkable. Stomach/Bowel: Stomach is within normal limits. Appendix appears normal. No evidence of bowel wall thickening, distention, or inflammatory changes. Diverticulosis of descending and sigmoid colon is noted without inflammation. Vascular/Lymphatic: Aortic atherosclerosis. No enlarged abdominal or pelvic lymph nodes. Reproductive: Status post hysterectomy. No adnexal masses. Other: No abdominal wall hernia or abnormality. No  abdominopelvic ascites. Musculoskeletal: No acute or significant osseous findings. IMPRESSION: Moderate to severe right hydroureteronephrosis is noted secondary to 6 mm calculus in midportion of right ureter. Nonobstructive left nephrolithiasis. Minimal left hydronephrosis is noted without obstructing calculus. Diverticulosis of descending and sigmoid colon without inflammation. Aortic Atherosclerosis (ICD10-I70.0). Electronically Signed   By: Lupita Raider M.D.   On: 09/23/2022 13:50   DG Chest Portable 1 View  Result Date: 09/23/2022 CLINICAL DATA:  Altered mental status, generalized weakness. EXAM: PORTABLE CHEST 1 VIEW COMPARISON:   Chest x-rays dated 03/21/2022 and 06/07/2021. FINDINGS: Heart size and mediastinal contours are stable. Lungs are clear. No pleural effusion or pneumothorax is seen. Chronic elevation of the RIGHT hemidiaphragm. Osseous structures about the chest are unremarkable. IMPRESSION: No active disease. No evidence of pneumonia or pulmonary edema. Electronically Signed   By: Bary Richard M.D.   On: 09/23/2022 09:09   CT Head Wo Contrast  Result Date: 09/23/2022 CLINICAL DATA:  Mental status change with unknown cause. EXAM: CT HEAD WITHOUT CONTRAST TECHNIQUE: Contiguous axial images were obtained from the base of the skull through the vertex without intravenous contrast. RADIATION DOSE REDUCTION: This exam was performed according to the departmental dose-optimization program which includes automated exposure control, adjustment of the mA and/or kV according to patient size and/or use of iterative reconstruction technique. COMPARISON:  03/07/2016 FINDINGS: Brain: No evidence of acute infarction, hemorrhage, hydrocephalus, extra-axial collection or mass lesion/mass effect. Generalized cerebral volume loss with mild chronic small vessel ischemia. Vascular: No hyperdense vessel or unexpected calcification. Skull: Normal. Negative for fracture or focal lesion. Sinuses/Orbits: Negative IMPRESSION: No acute finding.  Stable head CT when compared to 2017. Electronically Signed   By: Tiburcio Pea M.D.   On: 09/23/2022 09:05    Microbiology: Results for orders placed or performed during the hospital encounter of 09/23/22  SARS Coronavirus 2 by RT PCR (hospital order, performed in [facility] hospital lab) *cepheid single result test* Anterior Nasal Swab     Status: None   Collection Time: 09/23/22  8:03 AM   Specimen: Anterior Nasal Swab  Result Value Ref Range Status   SARS Coronavirus 2 by RT PCR NEGATIVE NEGATIVE Final    Comment: (NOTE) SARS-CoV-2 target nucleic acids are NOT DETECTED.  The SARS-CoV-2 RNA is  generally detectable in upper and lower respiratory specimens during the acute phase of infection. The lowest concentration of SARS-CoV-2 viral copies this assay can detect is 250 copies / mL. A negative result does not preclude SARS-CoV-2 infection and should not be used as the sole basis for treatment or other patient management decisions.  A negative result may occur with improper specimen collection / handling, submission of specimen other than nasopharyngeal swab, presence of viral mutation(s) within the areas targeted by this assay, and inadequate number of viral copies (<250 copies / mL). A negative result must be combined with clinical observations, patient history, and epidemiological information.  Fact Sheet for Patients:   RoadLapTop.co.za  Fact Sheet for Healthcare Providers: http://kim-miller.com/  This test is not yet approved or  cleared by the [place] FDA and has been authorized for detection and/or diagnosis of SARS-CoV-2 by FDA under an Emergency Use Authorization (EUA).  This EUA will remain in effect (meaning this test can be used) for the duration of the COVID-19 declaration under Section 564(b)(1) of the Act, 21 U.S.C. section 360bbb-3(b)(1), unless the authorization is terminated or revoked sooner.  Performed at [facility], [address], [place], [place] [address]  Urine Culture     Status: Abnormal   Collection Time: 09/23/22 11:09 AM   Specimen: Urine, Clean Catch  Result Value Ref Range Status   Specimen Description   Final    URINE, CLEAN CATCH Performed at [facility], [address] [place], [place] [address]    Special Requests   Final    NONE Performed at [facility], [address] [place], [place] [address]    Culture (A)  Final    40,000 COLONIES/mL DIPHTHEROIDS(CORYNEBACTERIUM SPECIES) Standardized susceptibility testing for this organism is not available. Performed at [facility], [address], [place], [place] [address]    Report Status 09/25/2022 FINAL  Final  Culture, blood (Routine X 2) w Reflex to ID Panel     Status: None (Preliminary result)   Collection Time: 09/24/22  5:57 PM   Specimen: BLOOD RIGHT HAND  Result Value Ref Range Status   Specimen Description BLOOD RIGHT HAND  Final   Special Requests   Final    BOTTLES DRAWN AEROBIC AND ANAEROBIC Blood Culture results may not be optimal due to an excessive volume of blood received in culture bottles   Culture   Final    NO GROWTH 3 DAYS Performed at [facility], [address], [place], [place] [address]    Report Status PENDING  Incomplete  Culture, blood (Routine X 2) w Reflex to ID Panel     Status: None (Preliminary result)   Collection Time: 09/24/22  5:57 PM   Specimen: BLOOD LEFT HAND  Result Value Ref Range Status   Specimen Description BLOOD LEFT HAND AEROBIC BOTTLE ONLY  Final   Special Requests   Final    Blood Culture results may not be optimal due to an excessive volume of blood received in culture bottles   Culture   Final    NO GROWTH 3 DAYS Performed at [facility], [address], [place], [place] [address]    Report Status PENDING  Incomplete   *Note: Due to a large number of results and/or encounters for the requested time period, some results have not been displayed. A complete set of results can be found in Results Review.    Labs: CBC: Recent Labs  Lab 09/23/22 0759 09/24/22 0517 09/25/22 0417 09/26/22 0408 09/27/22 0426  WBC 12.8* 11.3* 12.3* 13.7* 10.3  NEUTROABS 10.5*  --   --   --   --   HGB 11.4* 11.2* 11.2* 10.7* 11.1*  HCT 35.0* 35.5* 35.2* 33.9* 35.1*  MCV 91.6 92.9 93.1 93.1 92.1  PLT 304 320 344 371 382   Basic Metabolic Panel: Recent Labs  Lab 09/23/22 0759 09/24/22 0517 09/25/22 0417 09/26/22 0408 09/26/22 1109 09/27/22 0426  NA 134* 136 134* 137  --  137  K 3.5 3.3* 4.3 3.5  --  3.5  CL 101 103 104 103  --  102  CO2 25 25 25 25   --   26  GLUCOSE 108* 94 128* 90  --  89  BUN 15 11 14 18   --  15  CREATININE 1.53* 1.30* 1.32* 1.43*  --  1.45*  CALCIUM 9.4 9.4 9.9 9.5  --  9.6  MG  --   --   --   --  1.9 1.9   Liver Function Tests: Recent Labs  Lab 09/23/22 0759  AST 18  ALT 15  ALKPHOS 71  BILITOT 0.3  PROT 8.5*  ALBUMIN 3.4*   CBG: Recent Labs  Lab 09/26/22 1142 09/26/22 1714 09/26/22 2011 09/27/22 0732 09/27/22 1109  GLUCAP 92 104* 98 105* 100*    Discharge time spent: greater than 30 minutes.  Signed: Catarina Hartshorn, MD [facility] 09/27/2022

## 2022-09-27 NOTE — TOC Transition Note (Signed)
Transition of Care [facility]) - CM/SW Discharge Note   Patient Details  Name: Cheryl Abbott MRN: [id number] Date of Birth: 24-Apr-1946  Transition of Care [facility]) CM/SW Contact:  Elliot Gault, LCSW Phone Number: 09/27/2022, 11:19 AM   Clinical Narrative:     Pt stable for dc home with HHPT/OT today per MD. Pt remains in agreement with dc plan. Updated Morrie Sheldon at [facility] and they will follow up with pt at home.  No other TOC needs identified for dc.  Final next level of care: Home w Home Health Services Barriers to Discharge: Barriers Resolved   Patient Goals and CMS Choice   Choice offered to / list presented to : Patient  Discharge Placement                         Discharge Plan and Services Additional resources added to the After Visit Summary for   In-house Referral: Clinical Social Work   Post Acute Care Choice: Home Health                    HH Arranged: PT, OT [facility] Agency: [facility] Date HH Agency Contacted: 09/25/22 Time HH Agency Contacted: 1144 Representative spoke with at [facility] Agency: Morrie Sheldon  Social Determinants of Health (SDOH) Interventions SDOH Screenings   Food Insecurity: No Food Insecurity (07/06/2022)  Housing: Low Risk  (07/06/2022)  Transportation Needs: No Transportation Needs (07/06/2022)  Utilities: Not At Risk (07/06/2022)  Alcohol Screen: Low Risk  (07/06/2022)  Depression (PHQ2-9): Medium Risk (07/10/2022)  Financial Resource Strain: Low Risk  (07/06/2022)  Physical Activity: Inactive (07/06/2022)  Social Connections: Moderately Isolated (07/06/2022)  Stress: No Stress Concern Present (07/06/2022)  Tobacco Use: Medium Risk (09/24/2022)     Readmission Risk Interventions     No data to display

## 2022-09-27 NOTE — Care Management Important Message (Signed)
Important Message  Patient Details  Name: Cheryl Abbott MRN: [id number] Date of Birth: 1945-09-11   Medicare Important Message Given:  Yes     Corey Harold 09/27/2022, 11:14 AM

## 2022-09-28 ENCOUNTER — Encounter (HOSPITAL_COMMUNITY): Payer: Self-pay | Admitting: Urology

## 2022-09-28 ENCOUNTER — Telehealth: Payer: Self-pay

## 2022-09-28 DIAGNOSIS — G4733 Obstructive sleep apnea (adult) (pediatric): Secondary | ICD-10-CM | POA: Diagnosis not present

## 2022-09-28 DIAGNOSIS — N1832 Chronic kidney disease, stage 3b: Secondary | ICD-10-CM | POA: Diagnosis not present

## 2022-09-28 DIAGNOSIS — K5730 Diverticulosis of large intestine without perforation or abscess without bleeding: Secondary | ICD-10-CM | POA: Diagnosis not present

## 2022-09-28 DIAGNOSIS — Z7982 Long term (current) use of aspirin: Secondary | ICD-10-CM | POA: Diagnosis not present

## 2022-09-28 DIAGNOSIS — Z7985 Long-term (current) use of injectable non-insulin antidiabetic drugs: Secondary | ICD-10-CM | POA: Diagnosis not present

## 2022-09-28 DIAGNOSIS — M5430 Sciatica, unspecified side: Secondary | ICD-10-CM | POA: Diagnosis not present

## 2022-09-28 DIAGNOSIS — M25511 Pain in right shoulder: Secondary | ICD-10-CM | POA: Diagnosis not present

## 2022-09-28 DIAGNOSIS — K219 Gastro-esophageal reflux disease without esophagitis: Secondary | ICD-10-CM | POA: Diagnosis not present

## 2022-09-28 DIAGNOSIS — M25512 Pain in left shoulder: Secondary | ICD-10-CM | POA: Diagnosis not present

## 2022-09-28 DIAGNOSIS — E1151 Type 2 diabetes mellitus with diabetic peripheral angiopathy without gangrene: Secondary | ICD-10-CM | POA: Diagnosis not present

## 2022-09-28 DIAGNOSIS — E1142 Type 2 diabetes mellitus with diabetic polyneuropathy: Secondary | ICD-10-CM | POA: Diagnosis not present

## 2022-09-28 DIAGNOSIS — E785 Hyperlipidemia, unspecified: Secondary | ICD-10-CM | POA: Diagnosis not present

## 2022-09-28 DIAGNOSIS — E611 Iron deficiency: Secondary | ICD-10-CM | POA: Diagnosis not present

## 2022-09-28 DIAGNOSIS — Z79891 Long term (current) use of opiate analgesic: Secondary | ICD-10-CM | POA: Diagnosis not present

## 2022-09-28 DIAGNOSIS — A419 Sepsis, unspecified organism: Secondary | ICD-10-CM | POA: Diagnosis not present

## 2022-09-28 DIAGNOSIS — G8929 Other chronic pain: Secondary | ICD-10-CM | POA: Diagnosis not present

## 2022-09-28 DIAGNOSIS — N136 Pyonephrosis: Secondary | ICD-10-CM | POA: Diagnosis not present

## 2022-09-28 DIAGNOSIS — M1990 Unspecified osteoarthritis, unspecified site: Secondary | ICD-10-CM | POA: Diagnosis not present

## 2022-09-28 DIAGNOSIS — C9000 Multiple myeloma not having achieved remission: Secondary | ICD-10-CM | POA: Diagnosis not present

## 2022-09-28 DIAGNOSIS — I129 Hypertensive chronic kidney disease with stage 1 through stage 4 chronic kidney disease, or unspecified chronic kidney disease: Secondary | ICD-10-CM | POA: Diagnosis not present

## 2022-09-28 DIAGNOSIS — E1122 Type 2 diabetes mellitus with diabetic chronic kidney disease: Secondary | ICD-10-CM | POA: Diagnosis not present

## 2022-09-28 DIAGNOSIS — F32A Depression, unspecified: Secondary | ICD-10-CM | POA: Diagnosis not present

## 2022-09-28 DIAGNOSIS — M5116 Intervertebral disc disorders with radiculopathy, lumbar region: Secondary | ICD-10-CM | POA: Diagnosis not present

## 2022-09-28 NOTE — Transitions of Care (Post Inpatient/ED Visit) (Signed)
   09/28/2022  Name: Cheryl Abbott MRN: [id number] DOB: [birth year]  Today's TOC FU Call Status: Today's TOC FU Call Status:: Successful TOC FU Call Competed TOC FU Call Complete Date: 09/28/22  Transition Care Management Follow-up Telephone Call Date of Discharge: 09/27/22 [facility] Type of Discharge: Inpatient Admission Primary Inpatient Discharge Diagnosis:: Sepsis How have you been since you were released from the hospital?: Better Any questions or concerns?: No  Items Reviewed: Did you receive and understand the discharge instructions provided?: Yes Medications obtained and verified?: Yes (Medications Reviewed) Any new allergies since your discharge?: No Dietary orders reviewed?: Yes Type of Diet Ordered:: Heart Healthy Do you have support at home?: Yes People in Home: child(ren), adult Name of Support/Comfort Primary Source: Cheryl Abbott-daughter  Home Care and Equipment/Supplies: Were Home Health Services Ordered?: Yes Name of Home Health Agency:: [facility] Has Agency set up a time to come to your home?: Yes First Home Health Visit Date: 09/28/22 Any new equipment or medical supplies ordered?: No  Functional Questionnaire: Do you need assistance with bathing/showering or dressing?: No Do you need assistance with meal preparation?: No Do you need assistance with eating?: No Do you have difficulty maintaining continence: Yes (patient had episodes of incontinence after foley removed) Do you need assistance with getting out of bed/getting out of a chair/moving?: No Do you have difficulty managing or taking your medications?: No  Follow up appointments reviewed: PCP Follow-up appointment confirmed?: Yes Date of PCP follow-up appointment?: 10/09/22 Follow-up Provider: Dr. Lodema Hong Specialist [facility] Follow-up appointment confirmed?: No Reason Specialist Follow-Up Not Confirmed: Patient has Specialist Provider Number and will Call for Appointment Do  you need transportation to your follow-up appointment?: No Do you understand care options if your condition(s) worsen?: Yes-patient verbalized understanding Spoke with Herbert Seta at [facility] [phone]) and verified patient to be seen today.    SDOH Interventions Today    Flowsheet Row Most Recent Value  SDOH Interventions   Food Insecurity Interventions Intervention Not Indicated  Housing Interventions Intervention Not Indicated  Transportation Interventions Intervention Not Indicated      Interventions Today    Flowsheet Row Most Recent Value  Chronic Disease   Chronic disease during today's visit Other  [Sepsis]  General Interventions   General Interventions Discussed/Reviewed General Interventions Discussed  Pharmacy Interventions   Pharmacy Dicussed/Reviewed Pharmacy Topics Discussed       Jodelle Gross, RN, BSN, CCM Care Management Coordinator [facility] Phone: [phone]/Fax: [phone]

## 2022-09-29 LAB — CULTURE, BLOOD (ROUTINE X 2)

## 2022-10-01 ENCOUNTER — Telehealth: Payer: Self-pay

## 2022-10-01 DIAGNOSIS — G8929 Other chronic pain: Secondary | ICD-10-CM | POA: Diagnosis not present

## 2022-10-01 DIAGNOSIS — M25511 Pain in right shoulder: Secondary | ICD-10-CM | POA: Diagnosis not present

## 2022-10-01 DIAGNOSIS — E1122 Type 2 diabetes mellitus with diabetic chronic kidney disease: Secondary | ICD-10-CM | POA: Diagnosis not present

## 2022-10-01 DIAGNOSIS — K5730 Diverticulosis of large intestine without perforation or abscess without bleeding: Secondary | ICD-10-CM | POA: Diagnosis not present

## 2022-10-01 DIAGNOSIS — M5430 Sciatica, unspecified side: Secondary | ICD-10-CM | POA: Diagnosis not present

## 2022-10-01 DIAGNOSIS — Z7982 Long term (current) use of aspirin: Secondary | ICD-10-CM | POA: Diagnosis not present

## 2022-10-01 DIAGNOSIS — K219 Gastro-esophageal reflux disease without esophagitis: Secondary | ICD-10-CM | POA: Diagnosis not present

## 2022-10-01 DIAGNOSIS — Z7985 Long-term (current) use of injectable non-insulin antidiabetic drugs: Secondary | ICD-10-CM | POA: Diagnosis not present

## 2022-10-01 DIAGNOSIS — E611 Iron deficiency: Secondary | ICD-10-CM | POA: Diagnosis not present

## 2022-10-01 DIAGNOSIS — C9000 Multiple myeloma not having achieved remission: Secondary | ICD-10-CM | POA: Diagnosis not present

## 2022-10-01 DIAGNOSIS — G4733 Obstructive sleep apnea (adult) (pediatric): Secondary | ICD-10-CM | POA: Diagnosis not present

## 2022-10-01 DIAGNOSIS — E1142 Type 2 diabetes mellitus with diabetic polyneuropathy: Secondary | ICD-10-CM | POA: Diagnosis not present

## 2022-10-01 DIAGNOSIS — I129 Hypertensive chronic kidney disease with stage 1 through stage 4 chronic kidney disease, or unspecified chronic kidney disease: Secondary | ICD-10-CM | POA: Diagnosis not present

## 2022-10-01 DIAGNOSIS — M1990 Unspecified osteoarthritis, unspecified site: Secondary | ICD-10-CM | POA: Diagnosis not present

## 2022-10-01 DIAGNOSIS — N136 Pyonephrosis: Secondary | ICD-10-CM | POA: Diagnosis not present

## 2022-10-01 DIAGNOSIS — E785 Hyperlipidemia, unspecified: Secondary | ICD-10-CM | POA: Diagnosis not present

## 2022-10-01 DIAGNOSIS — M5116 Intervertebral disc disorders with radiculopathy, lumbar region: Secondary | ICD-10-CM | POA: Diagnosis not present

## 2022-10-01 DIAGNOSIS — N1832 Chronic kidney disease, stage 3b: Secondary | ICD-10-CM | POA: Diagnosis not present

## 2022-10-01 DIAGNOSIS — M25512 Pain in left shoulder: Secondary | ICD-10-CM | POA: Diagnosis not present

## 2022-10-01 DIAGNOSIS — A419 Sepsis, unspecified organism: Secondary | ICD-10-CM | POA: Diagnosis not present

## 2022-10-01 DIAGNOSIS — F32A Depression, unspecified: Secondary | ICD-10-CM | POA: Diagnosis not present

## 2022-10-01 DIAGNOSIS — E1151 Type 2 diabetes mellitus with diabetic peripheral angiopathy without gangrene: Secondary | ICD-10-CM | POA: Diagnosis not present

## 2022-10-01 DIAGNOSIS — Z79891 Long term (current) use of opiate analgesic: Secondary | ICD-10-CM | POA: Diagnosis not present

## 2022-10-01 NOTE — Telephone Encounter (Signed)
Patient left voicemail needing to have second surgery scheduled.   Patient called and notified of repeat surgery needed. Patient voiced understanding.

## 2022-10-02 DIAGNOSIS — E1122 Type 2 diabetes mellitus with diabetic chronic kidney disease: Secondary | ICD-10-CM | POA: Diagnosis not present

## 2022-10-02 DIAGNOSIS — K5730 Diverticulosis of large intestine without perforation or abscess without bleeding: Secondary | ICD-10-CM | POA: Diagnosis not present

## 2022-10-02 DIAGNOSIS — A419 Sepsis, unspecified organism: Secondary | ICD-10-CM | POA: Diagnosis not present

## 2022-10-02 DIAGNOSIS — E611 Iron deficiency: Secondary | ICD-10-CM | POA: Diagnosis not present

## 2022-10-02 DIAGNOSIS — G4733 Obstructive sleep apnea (adult) (pediatric): Secondary | ICD-10-CM | POA: Diagnosis not present

## 2022-10-02 DIAGNOSIS — M5430 Sciatica, unspecified side: Secondary | ICD-10-CM | POA: Diagnosis not present

## 2022-10-02 DIAGNOSIS — E785 Hyperlipidemia, unspecified: Secondary | ICD-10-CM | POA: Diagnosis not present

## 2022-10-02 DIAGNOSIS — G8929 Other chronic pain: Secondary | ICD-10-CM | POA: Diagnosis not present

## 2022-10-02 DIAGNOSIS — M25511 Pain in right shoulder: Secondary | ICD-10-CM | POA: Diagnosis not present

## 2022-10-02 DIAGNOSIS — N136 Pyonephrosis: Secondary | ICD-10-CM | POA: Diagnosis not present

## 2022-10-02 DIAGNOSIS — Z7985 Long-term (current) use of injectable non-insulin antidiabetic drugs: Secondary | ICD-10-CM | POA: Diagnosis not present

## 2022-10-02 DIAGNOSIS — E1151 Type 2 diabetes mellitus with diabetic peripheral angiopathy without gangrene: Secondary | ICD-10-CM | POA: Diagnosis not present

## 2022-10-02 DIAGNOSIS — Z79891 Long term (current) use of opiate analgesic: Secondary | ICD-10-CM | POA: Diagnosis not present

## 2022-10-02 DIAGNOSIS — M25512 Pain in left shoulder: Secondary | ICD-10-CM | POA: Diagnosis not present

## 2022-10-02 DIAGNOSIS — I129 Hypertensive chronic kidney disease with stage 1 through stage 4 chronic kidney disease, or unspecified chronic kidney disease: Secondary | ICD-10-CM | POA: Diagnosis not present

## 2022-10-02 DIAGNOSIS — Z7982 Long term (current) use of aspirin: Secondary | ICD-10-CM | POA: Diagnosis not present

## 2022-10-02 DIAGNOSIS — K219 Gastro-esophageal reflux disease without esophagitis: Secondary | ICD-10-CM | POA: Diagnosis not present

## 2022-10-02 DIAGNOSIS — M5116 Intervertebral disc disorders with radiculopathy, lumbar region: Secondary | ICD-10-CM | POA: Diagnosis not present

## 2022-10-02 DIAGNOSIS — E1142 Type 2 diabetes mellitus with diabetic polyneuropathy: Secondary | ICD-10-CM | POA: Diagnosis not present

## 2022-10-02 DIAGNOSIS — F32A Depression, unspecified: Secondary | ICD-10-CM | POA: Diagnosis not present

## 2022-10-02 DIAGNOSIS — M1990 Unspecified osteoarthritis, unspecified site: Secondary | ICD-10-CM | POA: Diagnosis not present

## 2022-10-02 DIAGNOSIS — N1832 Chronic kidney disease, stage 3b: Secondary | ICD-10-CM | POA: Diagnosis not present

## 2022-10-02 DIAGNOSIS — C9000 Multiple myeloma not having achieved remission: Secondary | ICD-10-CM | POA: Diagnosis not present

## 2022-10-02 IMAGING — US US RENAL
1 series · 14 of 25 positions shown · non-contrast
Comparison: CT abdomen pelvis 12/17/2019

CLINICAL DATA: Ureteral calculus

EXAM:
RENAL / URINARY TRACT ULTRASOUND COMPLETE

[Series 1: us renal · 0.22mm/px · 14 of 114 slices shown]
[im 1/114]
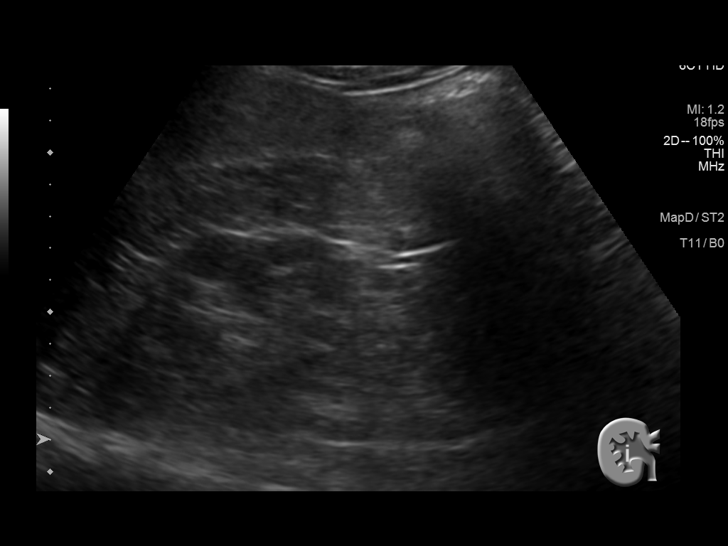
[im 10/114]
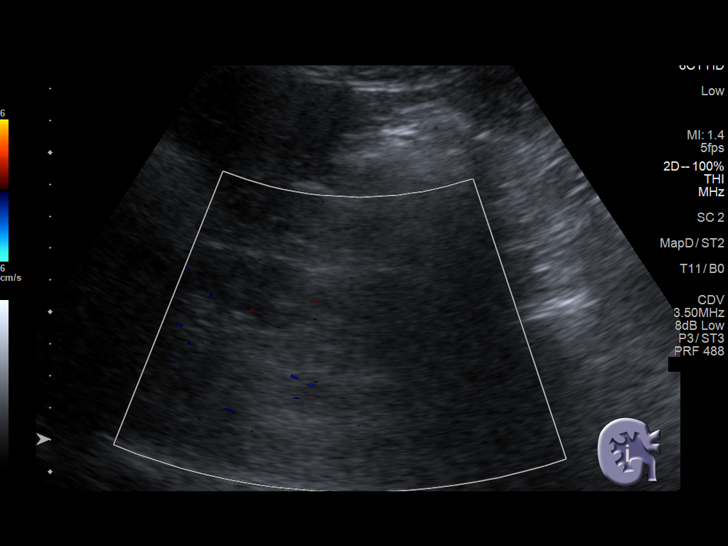
[im 19/114]
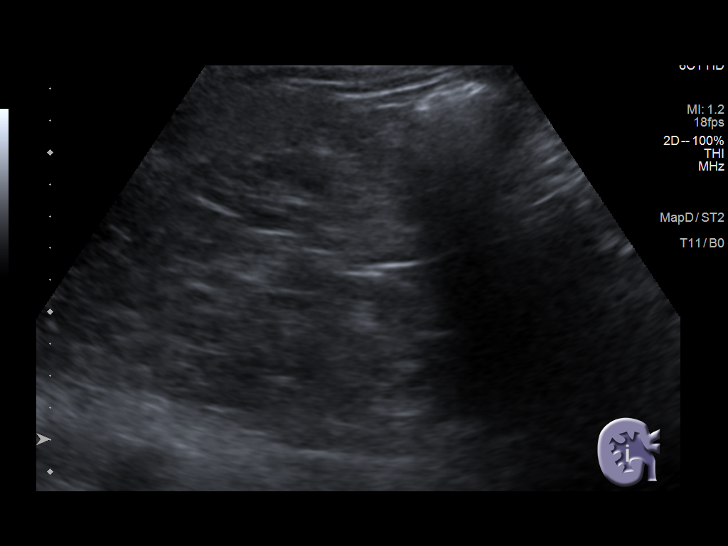
[im 29/114]
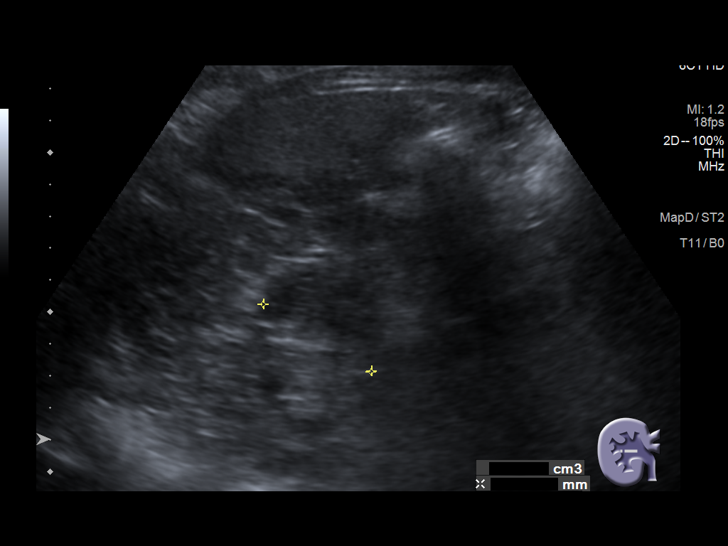
[im 38/114]
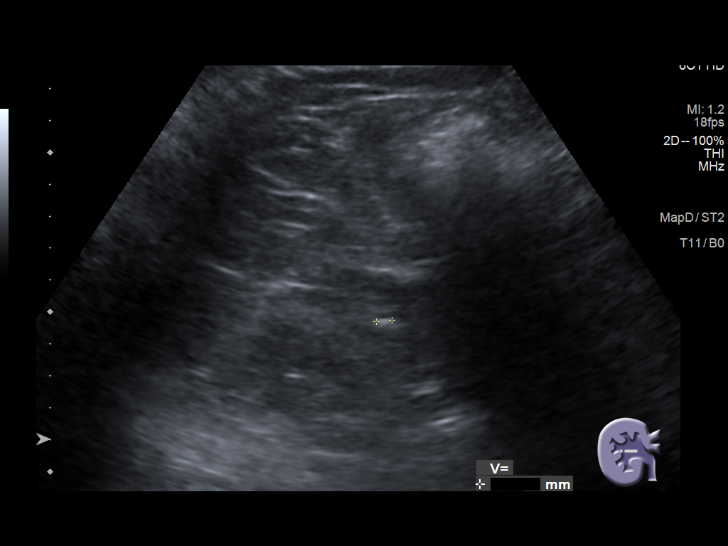
[im 43/114]
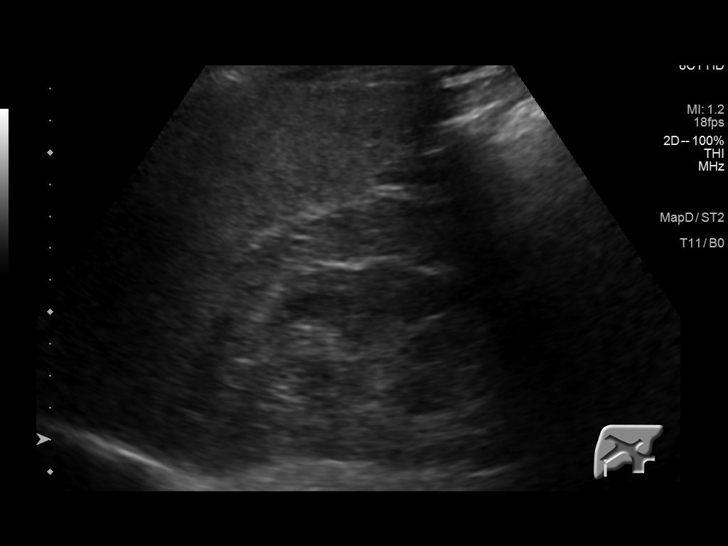
[im 52/114]
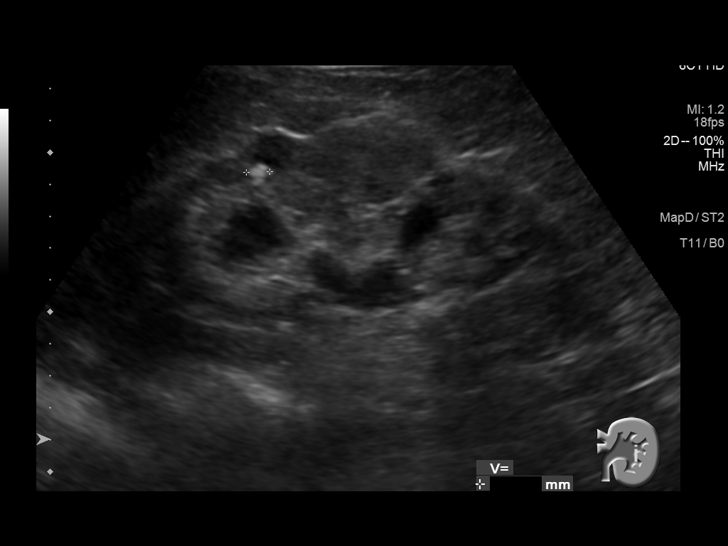
[im 62/114]
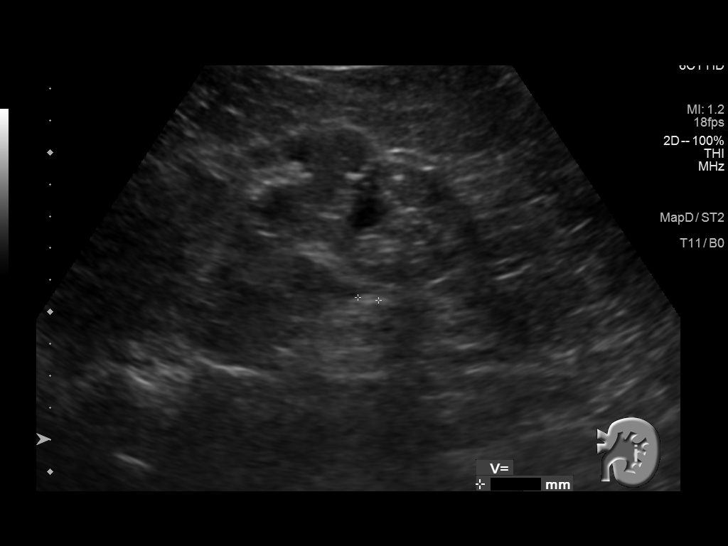
[im 71/114]
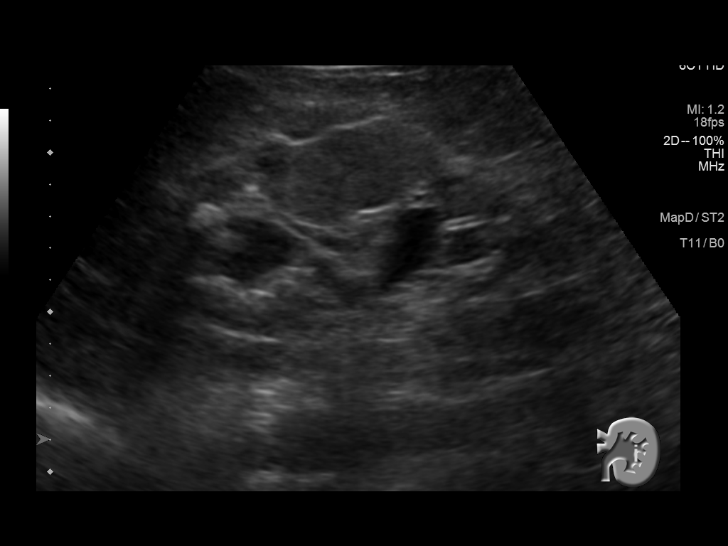
[im 76/114]
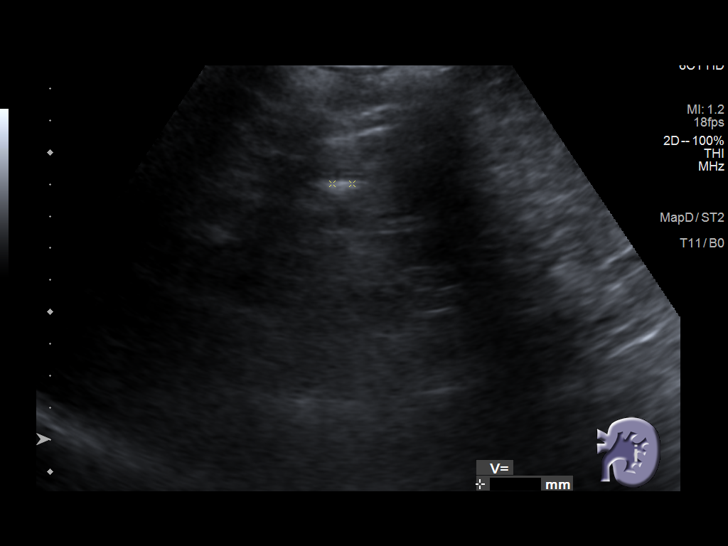
[im 85/114]
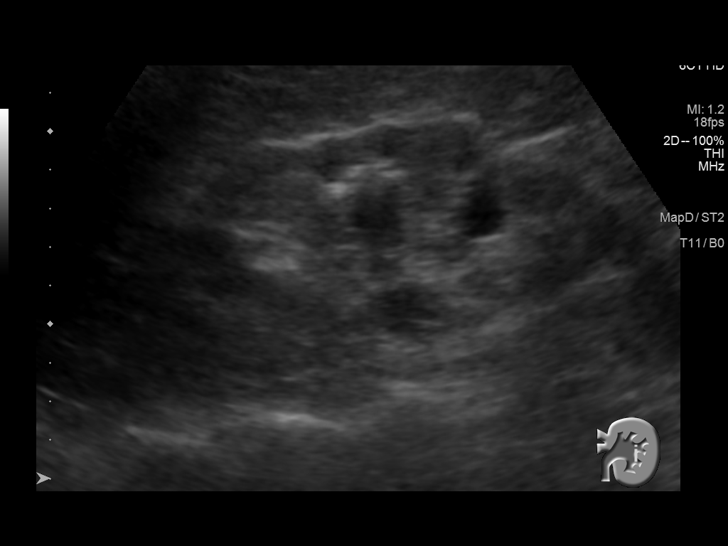
[im 95/114]
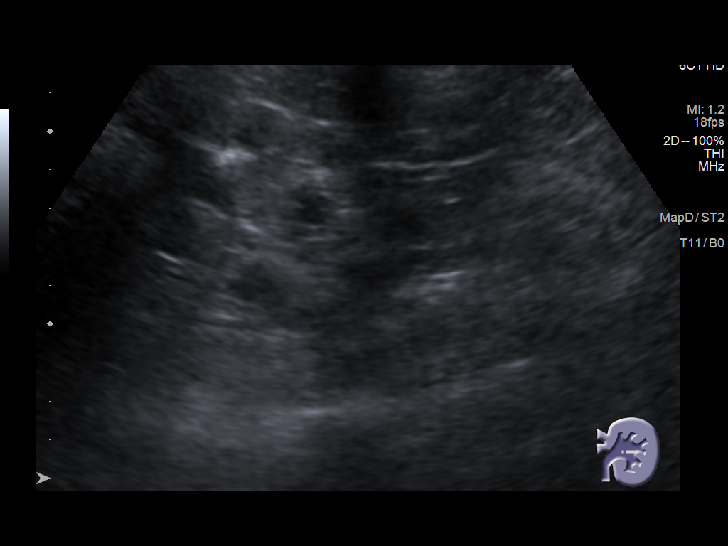
[im 104/114]
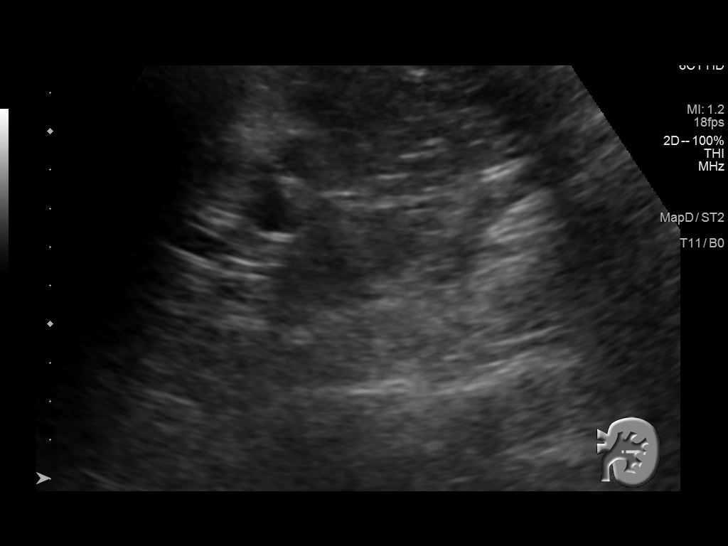
[im 114/114]
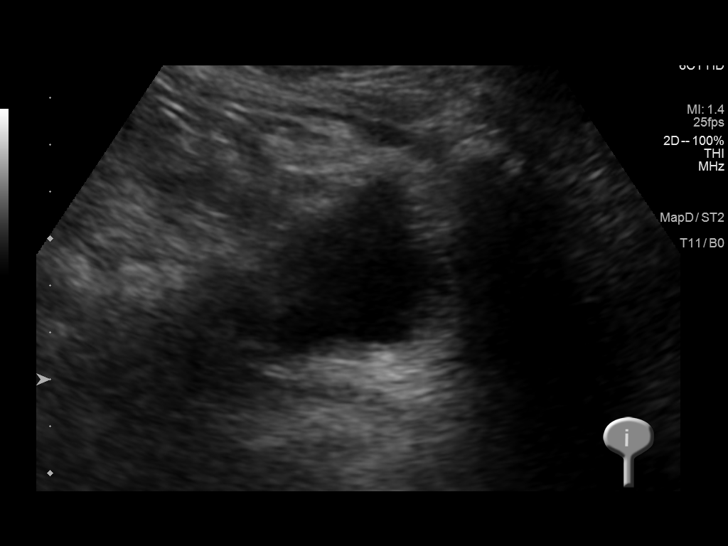

[14 of 25 positions shown; findings below may reference images not displayed]

FINDINGS: Right Kidney:

Renal measurements: 13.0 x 6.7 x 4.0 cm = volume: 183 mL. Cortical
thinning. Poorly visualized due to body habitus and increased
cortical echogenicity. No mass or hydronephrosis. Tiny nonspecific
nonshadowing echogenic focus identified.

Left Kidney:

Renal measurements: 12.7 x 7.1 x 5.5 cm = volume: 259 mL. Cortical
thinning. Increased cortical echogenicity. Focal area of cortical
thickening at mid kidney likely reflects a large column of Bertin
and residual retained cortical thickness. No definite mass
identified. Mild collecting system dilatation. Shadowing calculi
present corresponding to numerous stones on CT, largest at upper
pole 7 mm diameter.

Bladder:

Appears normal for partial degree of bladder distention.

Other:

N/A
IMPRESSION: Cortical thinning and medical renal disease changes of both kidneys.

Nonobstructing LEFT renal calculi largest 7 mm at upper pole.

Mild LEFT renal collecting system dilatation.

## 2022-10-03 ENCOUNTER — Other Ambulatory Visit: Payer: Self-pay | Admitting: Orthopedic Surgery

## 2022-10-03 DIAGNOSIS — M25512 Pain in left shoulder: Secondary | ICD-10-CM

## 2022-10-04 DIAGNOSIS — Z7982 Long term (current) use of aspirin: Secondary | ICD-10-CM | POA: Diagnosis not present

## 2022-10-04 DIAGNOSIS — E1142 Type 2 diabetes mellitus with diabetic polyneuropathy: Secondary | ICD-10-CM | POA: Diagnosis not present

## 2022-10-04 DIAGNOSIS — E611 Iron deficiency: Secondary | ICD-10-CM | POA: Diagnosis not present

## 2022-10-04 DIAGNOSIS — G4733 Obstructive sleep apnea (adult) (pediatric): Secondary | ICD-10-CM | POA: Diagnosis not present

## 2022-10-04 DIAGNOSIS — M25511 Pain in right shoulder: Secondary | ICD-10-CM | POA: Diagnosis not present

## 2022-10-04 DIAGNOSIS — E1122 Type 2 diabetes mellitus with diabetic chronic kidney disease: Secondary | ICD-10-CM | POA: Diagnosis not present

## 2022-10-04 DIAGNOSIS — K219 Gastro-esophageal reflux disease without esophagitis: Secondary | ICD-10-CM | POA: Diagnosis not present

## 2022-10-04 DIAGNOSIS — N136 Pyonephrosis: Secondary | ICD-10-CM | POA: Diagnosis not present

## 2022-10-04 DIAGNOSIS — C9000 Multiple myeloma not having achieved remission: Secondary | ICD-10-CM | POA: Diagnosis not present

## 2022-10-04 DIAGNOSIS — Z7985 Long-term (current) use of injectable non-insulin antidiabetic drugs: Secondary | ICD-10-CM | POA: Diagnosis not present

## 2022-10-04 DIAGNOSIS — F32A Depression, unspecified: Secondary | ICD-10-CM | POA: Diagnosis not present

## 2022-10-04 DIAGNOSIS — N1832 Chronic kidney disease, stage 3b: Secondary | ICD-10-CM | POA: Diagnosis not present

## 2022-10-04 DIAGNOSIS — M5430 Sciatica, unspecified side: Secondary | ICD-10-CM | POA: Diagnosis not present

## 2022-10-04 DIAGNOSIS — G8929 Other chronic pain: Secondary | ICD-10-CM | POA: Diagnosis not present

## 2022-10-04 DIAGNOSIS — A419 Sepsis, unspecified organism: Secondary | ICD-10-CM | POA: Diagnosis not present

## 2022-10-04 DIAGNOSIS — K5730 Diverticulosis of large intestine without perforation or abscess without bleeding: Secondary | ICD-10-CM | POA: Diagnosis not present

## 2022-10-04 DIAGNOSIS — Z79891 Long term (current) use of opiate analgesic: Secondary | ICD-10-CM | POA: Diagnosis not present

## 2022-10-04 DIAGNOSIS — M25512 Pain in left shoulder: Secondary | ICD-10-CM | POA: Diagnosis not present

## 2022-10-04 DIAGNOSIS — I129 Hypertensive chronic kidney disease with stage 1 through stage 4 chronic kidney disease, or unspecified chronic kidney disease: Secondary | ICD-10-CM | POA: Diagnosis not present

## 2022-10-04 DIAGNOSIS — M5116 Intervertebral disc disorders with radiculopathy, lumbar region: Secondary | ICD-10-CM | POA: Diagnosis not present

## 2022-10-04 DIAGNOSIS — E1151 Type 2 diabetes mellitus with diabetic peripheral angiopathy without gangrene: Secondary | ICD-10-CM | POA: Diagnosis not present

## 2022-10-04 DIAGNOSIS — M1990 Unspecified osteoarthritis, unspecified site: Secondary | ICD-10-CM | POA: Diagnosis not present

## 2022-10-04 DIAGNOSIS — E785 Hyperlipidemia, unspecified: Secondary | ICD-10-CM | POA: Diagnosis not present

## 2022-10-08 ENCOUNTER — Telehealth: Payer: Self-pay

## 2022-10-08 DIAGNOSIS — N1832 Chronic kidney disease, stage 3b: Secondary | ICD-10-CM | POA: Diagnosis not present

## 2022-10-08 DIAGNOSIS — G8929 Other chronic pain: Secondary | ICD-10-CM | POA: Diagnosis not present

## 2022-10-08 DIAGNOSIS — F32A Depression, unspecified: Secondary | ICD-10-CM | POA: Diagnosis not present

## 2022-10-08 DIAGNOSIS — M5430 Sciatica, unspecified side: Secondary | ICD-10-CM | POA: Diagnosis not present

## 2022-10-08 DIAGNOSIS — M1990 Unspecified osteoarthritis, unspecified site: Secondary | ICD-10-CM | POA: Diagnosis not present

## 2022-10-08 DIAGNOSIS — I129 Hypertensive chronic kidney disease with stage 1 through stage 4 chronic kidney disease, or unspecified chronic kidney disease: Secondary | ICD-10-CM | POA: Diagnosis not present

## 2022-10-08 DIAGNOSIS — E785 Hyperlipidemia, unspecified: Secondary | ICD-10-CM | POA: Diagnosis not present

## 2022-10-08 DIAGNOSIS — K219 Gastro-esophageal reflux disease without esophagitis: Secondary | ICD-10-CM | POA: Diagnosis not present

## 2022-10-08 DIAGNOSIS — M25511 Pain in right shoulder: Secondary | ICD-10-CM | POA: Diagnosis not present

## 2022-10-08 DIAGNOSIS — E1122 Type 2 diabetes mellitus with diabetic chronic kidney disease: Secondary | ICD-10-CM | POA: Diagnosis not present

## 2022-10-08 DIAGNOSIS — M5116 Intervertebral disc disorders with radiculopathy, lumbar region: Secondary | ICD-10-CM | POA: Diagnosis not present

## 2022-10-08 DIAGNOSIS — C9000 Multiple myeloma not having achieved remission: Secondary | ICD-10-CM | POA: Diagnosis not present

## 2022-10-08 DIAGNOSIS — E1151 Type 2 diabetes mellitus with diabetic peripheral angiopathy without gangrene: Secondary | ICD-10-CM | POA: Diagnosis not present

## 2022-10-08 DIAGNOSIS — Z7985 Long-term (current) use of injectable non-insulin antidiabetic drugs: Secondary | ICD-10-CM | POA: Diagnosis not present

## 2022-10-08 DIAGNOSIS — G4733 Obstructive sleep apnea (adult) (pediatric): Secondary | ICD-10-CM | POA: Diagnosis not present

## 2022-10-08 DIAGNOSIS — M25512 Pain in left shoulder: Secondary | ICD-10-CM | POA: Diagnosis not present

## 2022-10-08 DIAGNOSIS — A419 Sepsis, unspecified organism: Secondary | ICD-10-CM | POA: Diagnosis not present

## 2022-10-08 DIAGNOSIS — K5730 Diverticulosis of large intestine without perforation or abscess without bleeding: Secondary | ICD-10-CM | POA: Diagnosis not present

## 2022-10-08 DIAGNOSIS — E611 Iron deficiency: Secondary | ICD-10-CM | POA: Diagnosis not present

## 2022-10-08 DIAGNOSIS — Z79891 Long term (current) use of opiate analgesic: Secondary | ICD-10-CM | POA: Diagnosis not present

## 2022-10-08 DIAGNOSIS — E1142 Type 2 diabetes mellitus with diabetic polyneuropathy: Secondary | ICD-10-CM | POA: Diagnosis not present

## 2022-10-08 DIAGNOSIS — N136 Pyonephrosis: Secondary | ICD-10-CM | POA: Diagnosis not present

## 2022-10-08 DIAGNOSIS — Z7982 Long term (current) use of aspirin: Secondary | ICD-10-CM | POA: Diagnosis not present

## 2022-10-08 DIAGNOSIS — F411 Generalized anxiety disorder: Secondary | ICD-10-CM

## 2022-10-08 NOTE — Telephone Encounter (Signed)
I spoke with Cheryl Abbott. We have discussed possible surgery dates and 10/25/2022 was agreed upon by all parties. Patient given information about surgery date, what to expect pre-operatively and post operatively.    We discussed that a pre-op nurse will be calling to set up the pre-op visit that will take place prior to surgery. Informed patient that our office will communicate any additional care to be provided after surgery.    Patients questions or concerns were discussed during our call. Advised to call our office should there be any additional information, questions or concerns that arise. Patient verbalized understanding.

## 2022-10-09 ENCOUNTER — Inpatient Hospital Stay: Payer: 59 | Admitting: Family Medicine

## 2022-10-10 ENCOUNTER — Inpatient Hospital Stay: Payer: 59 | Admitting: Family Medicine

## 2022-10-10 ENCOUNTER — Encounter (HOSPITAL_COMMUNITY): Payer: Self-pay | Admitting: Hematology

## 2022-10-11 ENCOUNTER — Encounter: Payer: Self-pay | Admitting: Family Medicine

## 2022-10-11 ENCOUNTER — Ambulatory Visit (INDEPENDENT_AMBULATORY_CARE_PROVIDER_SITE_OTHER): Payer: 59 | Admitting: Family Medicine

## 2022-10-11 VITALS — BP 120/70 | HR 91 | Ht 66.0 in | Wt 184.0 lb

## 2022-10-11 DIAGNOSIS — F32A Depression, unspecified: Secondary | ICD-10-CM | POA: Diagnosis not present

## 2022-10-11 DIAGNOSIS — E1142 Type 2 diabetes mellitus with diabetic polyneuropathy: Secondary | ICD-10-CM | POA: Diagnosis not present

## 2022-10-11 DIAGNOSIS — N1832 Chronic kidney disease, stage 3b: Secondary | ICD-10-CM | POA: Diagnosis not present

## 2022-10-11 DIAGNOSIS — R300 Dysuria: Secondary | ICD-10-CM

## 2022-10-11 DIAGNOSIS — E611 Iron deficiency: Secondary | ICD-10-CM | POA: Diagnosis not present

## 2022-10-11 DIAGNOSIS — M5116 Intervertebral disc disorders with radiculopathy, lumbar region: Secondary | ICD-10-CM | POA: Diagnosis not present

## 2022-10-11 DIAGNOSIS — E1122 Type 2 diabetes mellitus with diabetic chronic kidney disease: Secondary | ICD-10-CM | POA: Diagnosis not present

## 2022-10-11 DIAGNOSIS — G8929 Other chronic pain: Secondary | ICD-10-CM | POA: Diagnosis not present

## 2022-10-11 DIAGNOSIS — Z7689 Persons encountering health services in other specified circumstances: Secondary | ICD-10-CM

## 2022-10-11 DIAGNOSIS — K219 Gastro-esophageal reflux disease without esophagitis: Secondary | ICD-10-CM | POA: Diagnosis not present

## 2022-10-11 DIAGNOSIS — N136 Pyonephrosis: Secondary | ICD-10-CM | POA: Diagnosis not present

## 2022-10-11 DIAGNOSIS — M5430 Sciatica, unspecified side: Secondary | ICD-10-CM | POA: Diagnosis not present

## 2022-10-11 DIAGNOSIS — N132 Hydronephrosis with renal and ureteral calculous obstruction: Secondary | ICD-10-CM

## 2022-10-11 DIAGNOSIS — I129 Hypertensive chronic kidney disease with stage 1 through stage 4 chronic kidney disease, or unspecified chronic kidney disease: Secondary | ICD-10-CM | POA: Diagnosis not present

## 2022-10-11 DIAGNOSIS — E1159 Type 2 diabetes mellitus with other circulatory complications: Secondary | ICD-10-CM | POA: Diagnosis not present

## 2022-10-11 DIAGNOSIS — I152 Hypertension secondary to endocrine disorders: Secondary | ICD-10-CM

## 2022-10-11 DIAGNOSIS — A419 Sepsis, unspecified organism: Secondary | ICD-10-CM | POA: Diagnosis not present

## 2022-10-11 DIAGNOSIS — M1990 Unspecified osteoarthritis, unspecified site: Secondary | ICD-10-CM | POA: Diagnosis not present

## 2022-10-11 DIAGNOSIS — G4733 Obstructive sleep apnea (adult) (pediatric): Secondary | ICD-10-CM | POA: Diagnosis not present

## 2022-10-11 DIAGNOSIS — C9000 Multiple myeloma not having achieved remission: Secondary | ICD-10-CM | POA: Diagnosis not present

## 2022-10-11 DIAGNOSIS — E785 Hyperlipidemia, unspecified: Secondary | ICD-10-CM | POA: Diagnosis not present

## 2022-10-11 DIAGNOSIS — K5730 Diverticulosis of large intestine without perforation or abscess without bleeding: Secondary | ICD-10-CM | POA: Diagnosis not present

## 2022-10-11 DIAGNOSIS — M25512 Pain in left shoulder: Secondary | ICD-10-CM | POA: Diagnosis not present

## 2022-10-11 DIAGNOSIS — M25511 Pain in right shoulder: Secondary | ICD-10-CM | POA: Diagnosis not present

## 2022-10-11 DIAGNOSIS — E1151 Type 2 diabetes mellitus with diabetic peripheral angiopathy without gangrene: Secondary | ICD-10-CM | POA: Diagnosis not present

## 2022-10-11 DIAGNOSIS — Z7982 Long term (current) use of aspirin: Secondary | ICD-10-CM | POA: Diagnosis not present

## 2022-10-11 DIAGNOSIS — Z79891 Long term (current) use of opiate analgesic: Secondary | ICD-10-CM | POA: Diagnosis not present

## 2022-10-11 DIAGNOSIS — Z7985 Long-term (current) use of injectable non-insulin antidiabetic drugs: Secondary | ICD-10-CM | POA: Diagnosis not present

## 2022-10-11 LAB — CBC WITH DIFFERENTIAL/PLATELET
Basophils Absolute: 0.1 10*3/uL (ref 0.0–0.2)
Basos: 1 %
EOS (ABSOLUTE): 0.3 10*3/uL (ref 0.0–0.4)
Eos: 3 %
Hematocrit: 35.5 % (ref 34.0–46.6)
Hemoglobin: 11.7 g/dL (ref 11.1–15.9)
Immature Grans (Abs): 0.1 10*3/uL (ref 0.0–0.1)
Immature Granulocytes: 1 %
Lymphocytes Absolute: 1.6 10*3/uL (ref 0.7–3.1)
Lymphs: 16 %
MCH: 29.9 pg (ref 26.6–33.0)
MCHC: 33 g/dL (ref 31.5–35.7)
MCV: 91 fL (ref 79–97)
Monocytes Absolute: 0.8 10*3/uL (ref 0.1–0.9)
Monocytes: 8 %
Neutrophils Absolute: 7.4 10*3/uL — ABNORMAL HIGH (ref 1.4–7.0)
Neutrophils: 71 %
Platelets: 349 10*3/uL (ref 150–450)
RBC: 3.91 x10E6/uL (ref 3.77–5.28)
RDW: 16 % — ABNORMAL HIGH (ref 11.7–15.4)
WBC: 10.2 10*3/uL (ref 3.4–10.8)

## 2022-10-11 LAB — BMP8+EGFR
BUN/Creatinine Ratio: 10 — ABNORMAL LOW (ref 12–28)
BUN: 15 mg/dL (ref 8–27)
CO2: 22 mmol/L (ref 20–29)
Calcium: 10 mg/dL (ref 8.7–10.3)
Chloride: 101 mmol/L (ref 96–106)
Creatinine, Ser: 1.5 mg/dL — ABNORMAL HIGH (ref 0.57–1.00)
Glucose: 102 mg/dL — ABNORMAL HIGH (ref 70–99)
Potassium: 3.9 mmol/L (ref 3.5–5.2)
Sodium: 138 mmol/L (ref 134–144)
eGFR: 35 mL/min/{1.73_m2} — ABNORMAL LOW (ref >59)

## 2022-10-11 NOTE — Patient Instructions (Signed)
F/U in 2 monhts, call if youneedme sooner  Stat CBC and diff , chem 7 and EGFR today  CCUA with reflex c/s in office today  if able , if not she may bring specimen back in am  No changes in medication   Careful not to fall  Thanks for choosing [facility], we consider it a privelige to serve you.

## 2022-10-12 ENCOUNTER — Other Ambulatory Visit: Payer: Self-pay

## 2022-10-12 DIAGNOSIS — E1151 Type 2 diabetes mellitus with diabetic peripheral angiopathy without gangrene: Secondary | ICD-10-CM | POA: Diagnosis not present

## 2022-10-12 DIAGNOSIS — I129 Hypertensive chronic kidney disease with stage 1 through stage 4 chronic kidney disease, or unspecified chronic kidney disease: Secondary | ICD-10-CM | POA: Diagnosis not present

## 2022-10-12 DIAGNOSIS — G4733 Obstructive sleep apnea (adult) (pediatric): Secondary | ICD-10-CM | POA: Diagnosis not present

## 2022-10-12 DIAGNOSIS — E1142 Type 2 diabetes mellitus with diabetic polyneuropathy: Secondary | ICD-10-CM | POA: Diagnosis not present

## 2022-10-12 DIAGNOSIS — E785 Hyperlipidemia, unspecified: Secondary | ICD-10-CM | POA: Diagnosis not present

## 2022-10-12 DIAGNOSIS — F32A Depression, unspecified: Secondary | ICD-10-CM | POA: Diagnosis not present

## 2022-10-12 DIAGNOSIS — R300 Dysuria: Secondary | ICD-10-CM | POA: Diagnosis not present

## 2022-10-12 DIAGNOSIS — C9000 Multiple myeloma not having achieved remission: Secondary | ICD-10-CM | POA: Diagnosis not present

## 2022-10-12 DIAGNOSIS — M5430 Sciatica, unspecified side: Secondary | ICD-10-CM | POA: Diagnosis not present

## 2022-10-12 DIAGNOSIS — K219 Gastro-esophageal reflux disease without esophagitis: Secondary | ICD-10-CM | POA: Diagnosis not present

## 2022-10-12 DIAGNOSIS — M25512 Pain in left shoulder: Secondary | ICD-10-CM | POA: Diagnosis not present

## 2022-10-12 DIAGNOSIS — M25511 Pain in right shoulder: Secondary | ICD-10-CM | POA: Diagnosis not present

## 2022-10-12 DIAGNOSIS — E1122 Type 2 diabetes mellitus with diabetic chronic kidney disease: Secondary | ICD-10-CM | POA: Diagnosis not present

## 2022-10-12 DIAGNOSIS — Z7982 Long term (current) use of aspirin: Secondary | ICD-10-CM | POA: Diagnosis not present

## 2022-10-12 DIAGNOSIS — E611 Iron deficiency: Secondary | ICD-10-CM | POA: Diagnosis not present

## 2022-10-12 DIAGNOSIS — N136 Pyonephrosis: Secondary | ICD-10-CM | POA: Diagnosis not present

## 2022-10-12 DIAGNOSIS — G8929 Other chronic pain: Secondary | ICD-10-CM | POA: Diagnosis not present

## 2022-10-12 DIAGNOSIS — M5116 Intervertebral disc disorders with radiculopathy, lumbar region: Secondary | ICD-10-CM | POA: Diagnosis not present

## 2022-10-12 DIAGNOSIS — A419 Sepsis, unspecified organism: Secondary | ICD-10-CM | POA: Diagnosis not present

## 2022-10-12 DIAGNOSIS — M1990 Unspecified osteoarthritis, unspecified site: Secondary | ICD-10-CM | POA: Diagnosis not present

## 2022-10-12 DIAGNOSIS — Z79891 Long term (current) use of opiate analgesic: Secondary | ICD-10-CM | POA: Diagnosis not present

## 2022-10-12 DIAGNOSIS — N1832 Chronic kidney disease, stage 3b: Secondary | ICD-10-CM | POA: Diagnosis not present

## 2022-10-12 DIAGNOSIS — K5730 Diverticulosis of large intestine without perforation or abscess without bleeding: Secondary | ICD-10-CM | POA: Diagnosis not present

## 2022-10-12 DIAGNOSIS — Z7985 Long-term (current) use of injectable non-insulin antidiabetic drugs: Secondary | ICD-10-CM | POA: Diagnosis not present

## 2022-10-12 LAB — POCT URINALYSIS DIP (CLINITEK)
Bilirubin, UA: NEGATIVE
Glucose, UA: NEGATIVE mg/dL
Ketones, POC UA: NEGATIVE mg/dL
Nitrite, UA: NEGATIVE
POC PROTEIN,UA: 100 — AB
Spec Grav, UA: 1.02 (ref 1.010–1.025)
Urobilinogen, UA: 0.2 E.U./dL
pH, UA: 7.5 (ref 5.0–8.0)

## 2022-10-13 NOTE — Progress Notes (Signed)
   Cheryl Abbott     MRN: [id number]      DOB: June 30, 1945  Chief Complaint  Patient presents with   Follow-up    Toc follow up d/c on 04/18, pt reports feeling low in energy and appetite is not all that good, noticed blood in her urine yesterday only, also feels sob.     HPI Cheryl Abbott is here for [facility] visit following hospitalizationfrom 4/14 to 09/17/2022  ROS Denies recent fever or chills. Denies sinus pressure, nasal congestion, ear pain or sore throat. Denies chest congestion, productive cough or wheezing. Denies chest pains, palpitations and leg swelling Denies abdominal pain, nausea, vomiting,diarrhea or constipation.   Denies dysuria, frequency, hesitancy or incontinence. Denies joint pain, swelling and limitation in mobility. Denies headaches, seizures, numbness, or tingling. Denies depression, anxiety or insomnia. Denies skin break down or rash.   PE  BP 120/70   Pulse 91   Ht 5\' 6"  (1.676 m)   Wt 184 lb (83.5 kg)   SpO2 91%   BMI 29.70 kg/m   Patient alert and oriented and in no cardiopulmonary distress.  HEENT: No facial asymmetry, EOMI,     Neck supple .  Chest: Clear to auscultation bilaterally.  CVS: S1, S2 no murmurs, no S3.Regular rate.  ABD: Soft non tender.   Ext: No edema  MS: Adequate ROM spine, shoulders, hips and knees.  Skin: Intact, no ulcerations or rash noted.  Psych: Good eye contact, normal affect. Memory intact not anxious or depressed appearing.  CNS: CN 2-12 intact, power,  normal throughout.no focal deficits noted.   Assessment & Plan  No problem-specific Assessment & Plan notes found for this encounter.

## 2022-10-14 ENCOUNTER — Encounter: Payer: Self-pay | Admitting: Family Medicine

## 2022-10-14 DIAGNOSIS — Z7689 Persons encountering health services in other specified circumstances: Secondary | ICD-10-CM | POA: Insufficient documentation

## 2022-10-14 LAB — URINE CULTURE

## 2022-10-14 NOTE — Assessment & Plan Note (Signed)
Patient in for follow up of recent hospitalization. Discharge summary, and laboratory and radiology data are reviewed, and any questions or concerns  are discussed. Specific issues requiring follow up are specifically addressed.  

## 2022-10-14 NOTE — Assessment & Plan Note (Signed)
Check CBC, and kidney function

## 2022-10-16 DIAGNOSIS — M1990 Unspecified osteoarthritis, unspecified site: Secondary | ICD-10-CM | POA: Diagnosis not present

## 2022-10-16 DIAGNOSIS — N1832 Chronic kidney disease, stage 3b: Secondary | ICD-10-CM | POA: Diagnosis not present

## 2022-10-16 DIAGNOSIS — E1151 Type 2 diabetes mellitus with diabetic peripheral angiopathy without gangrene: Secondary | ICD-10-CM | POA: Diagnosis not present

## 2022-10-16 DIAGNOSIS — C9000 Multiple myeloma not having achieved remission: Secondary | ICD-10-CM | POA: Diagnosis not present

## 2022-10-16 DIAGNOSIS — M5116 Intervertebral disc disorders with radiculopathy, lumbar region: Secondary | ICD-10-CM | POA: Diagnosis not present

## 2022-10-16 DIAGNOSIS — K219 Gastro-esophageal reflux disease without esophagitis: Secondary | ICD-10-CM | POA: Diagnosis not present

## 2022-10-16 DIAGNOSIS — N136 Pyonephrosis: Secondary | ICD-10-CM | POA: Diagnosis not present

## 2022-10-16 DIAGNOSIS — K5730 Diverticulosis of large intestine without perforation or abscess without bleeding: Secondary | ICD-10-CM | POA: Diagnosis not present

## 2022-10-16 DIAGNOSIS — E1142 Type 2 diabetes mellitus with diabetic polyneuropathy: Secondary | ICD-10-CM | POA: Diagnosis not present

## 2022-10-16 DIAGNOSIS — Z79891 Long term (current) use of opiate analgesic: Secondary | ICD-10-CM | POA: Diagnosis not present

## 2022-10-16 DIAGNOSIS — E1122 Type 2 diabetes mellitus with diabetic chronic kidney disease: Secondary | ICD-10-CM | POA: Diagnosis not present

## 2022-10-16 DIAGNOSIS — G4733 Obstructive sleep apnea (adult) (pediatric): Secondary | ICD-10-CM | POA: Diagnosis not present

## 2022-10-16 DIAGNOSIS — M25512 Pain in left shoulder: Secondary | ICD-10-CM | POA: Diagnosis not present

## 2022-10-16 DIAGNOSIS — I129 Hypertensive chronic kidney disease with stage 1 through stage 4 chronic kidney disease, or unspecified chronic kidney disease: Secondary | ICD-10-CM | POA: Diagnosis not present

## 2022-10-16 DIAGNOSIS — A419 Sepsis, unspecified organism: Secondary | ICD-10-CM | POA: Diagnosis not present

## 2022-10-16 DIAGNOSIS — E785 Hyperlipidemia, unspecified: Secondary | ICD-10-CM | POA: Diagnosis not present

## 2022-10-16 DIAGNOSIS — Z7982 Long term (current) use of aspirin: Secondary | ICD-10-CM | POA: Diagnosis not present

## 2022-10-16 DIAGNOSIS — M25511 Pain in right shoulder: Secondary | ICD-10-CM | POA: Diagnosis not present

## 2022-10-16 DIAGNOSIS — E611 Iron deficiency: Secondary | ICD-10-CM | POA: Diagnosis not present

## 2022-10-16 DIAGNOSIS — G8929 Other chronic pain: Secondary | ICD-10-CM | POA: Diagnosis not present

## 2022-10-16 DIAGNOSIS — Z7985 Long-term (current) use of injectable non-insulin antidiabetic drugs: Secondary | ICD-10-CM | POA: Diagnosis not present

## 2022-10-16 DIAGNOSIS — F32A Depression, unspecified: Secondary | ICD-10-CM | POA: Diagnosis not present

## 2022-10-16 DIAGNOSIS — M5430 Sciatica, unspecified side: Secondary | ICD-10-CM | POA: Diagnosis not present

## 2022-10-17 ENCOUNTER — Other Ambulatory Visit: Payer: Self-pay

## 2022-10-17 ENCOUNTER — Encounter (HOSPITAL_COMMUNITY): Payer: Self-pay | Admitting: Emergency Medicine

## 2022-10-17 ENCOUNTER — Inpatient Hospital Stay (HOSPITAL_COMMUNITY)
Admission: EM | Admit: 2022-10-17 | Discharge: 2022-10-19 | DRG: 690 | Disposition: A | Discharge status: Home Health | Payer: 59 | Attending: Family Medicine | Admitting: Family Medicine

## 2022-10-17 ENCOUNTER — Emergency Department (HOSPITAL_COMMUNITY): Payer: 59

## 2022-10-17 DIAGNOSIS — Z881 Allergy status to other antibiotic agents status: Secondary | ICD-10-CM

## 2022-10-17 DIAGNOSIS — E785 Hyperlipidemia, unspecified: Secondary | ICD-10-CM | POA: Diagnosis not present

## 2022-10-17 DIAGNOSIS — D472 Monoclonal gammopathy: Secondary | ICD-10-CM | POA: Diagnosis present

## 2022-10-17 DIAGNOSIS — Z87442 Personal history of urinary calculi: Secondary | ICD-10-CM | POA: Diagnosis not present

## 2022-10-17 DIAGNOSIS — K219 Gastro-esophageal reflux disease without esophagitis: Secondary | ICD-10-CM | POA: Diagnosis not present

## 2022-10-17 DIAGNOSIS — Z880 Allergy status to penicillin: Secondary | ICD-10-CM | POA: Diagnosis not present

## 2022-10-17 DIAGNOSIS — K5730 Diverticulosis of large intestine without perforation or abscess without bleeding: Secondary | ICD-10-CM | POA: Diagnosis not present

## 2022-10-17 DIAGNOSIS — N132 Hydronephrosis with renal and ureteral calculous obstruction: Secondary | ICD-10-CM | POA: Diagnosis not present

## 2022-10-17 DIAGNOSIS — Z825 Family history of asthma and other chronic lower respiratory diseases: Secondary | ICD-10-CM

## 2022-10-17 DIAGNOSIS — R109 Unspecified abdominal pain: Secondary | ICD-10-CM | POA: Diagnosis not present

## 2022-10-17 DIAGNOSIS — Z7982 Long term (current) use of aspirin: Secondary | ICD-10-CM | POA: Diagnosis not present

## 2022-10-17 DIAGNOSIS — R0902 Hypoxemia: Secondary | ICD-10-CM | POA: Diagnosis not present

## 2022-10-17 DIAGNOSIS — Z743 Need for continuous supervision: Secondary | ICD-10-CM | POA: Diagnosis not present

## 2022-10-17 DIAGNOSIS — F419 Anxiety disorder, unspecified: Secondary | ICD-10-CM | POA: Diagnosis present

## 2022-10-17 DIAGNOSIS — E1122 Type 2 diabetes mellitus with diabetic chronic kidney disease: Secondary | ICD-10-CM | POA: Diagnosis not present

## 2022-10-17 DIAGNOSIS — G8929 Other chronic pain: Secondary | ICD-10-CM | POA: Diagnosis not present

## 2022-10-17 DIAGNOSIS — N1832 Chronic kidney disease, stage 3b: Secondary | ICD-10-CM | POA: Diagnosis present

## 2022-10-17 DIAGNOSIS — Z8673 Personal history of transient ischemic attack (TIA), and cerebral infarction without residual deficits: Secondary | ICD-10-CM | POA: Diagnosis not present

## 2022-10-17 DIAGNOSIS — N136 Pyonephrosis: Secondary | ICD-10-CM | POA: Diagnosis not present

## 2022-10-17 DIAGNOSIS — Z87891 Personal history of nicotine dependence: Secondary | ICD-10-CM

## 2022-10-17 DIAGNOSIS — G4733 Obstructive sleep apnea (adult) (pediatric): Secondary | ICD-10-CM | POA: Diagnosis not present

## 2022-10-17 DIAGNOSIS — Z833 Family history of diabetes mellitus: Secondary | ICD-10-CM | POA: Diagnosis not present

## 2022-10-17 DIAGNOSIS — Z7951 Long term (current) use of inhaled steroids: Secondary | ICD-10-CM

## 2022-10-17 DIAGNOSIS — N1330 Unspecified hydronephrosis: Secondary | ICD-10-CM | POA: Diagnosis present

## 2022-10-17 DIAGNOSIS — Z888 Allergy status to other drugs, medicaments and biological substances status: Secondary | ICD-10-CM

## 2022-10-17 DIAGNOSIS — N183 Chronic kidney disease, stage 3 unspecified: Secondary | ICD-10-CM | POA: Diagnosis present

## 2022-10-17 DIAGNOSIS — Z809 Family history of malignant neoplasm, unspecified: Secondary | ICD-10-CM | POA: Diagnosis not present

## 2022-10-17 DIAGNOSIS — Z8249 Family history of ischemic heart disease and other diseases of the circulatory system: Secondary | ICD-10-CM

## 2022-10-17 DIAGNOSIS — N201 Calculus of ureter: Principal | ICD-10-CM

## 2022-10-17 DIAGNOSIS — B9689 Other specified bacterial agents as the cause of diseases classified elsewhere: Secondary | ICD-10-CM | POA: Diagnosis not present

## 2022-10-17 DIAGNOSIS — Z87440 Personal history of urinary (tract) infections: Secondary | ICD-10-CM | POA: Diagnosis not present

## 2022-10-17 DIAGNOSIS — I129 Hypertensive chronic kidney disease with stage 1 through stage 4 chronic kidney disease, or unspecified chronic kidney disease: Secondary | ICD-10-CM | POA: Diagnosis present

## 2022-10-17 DIAGNOSIS — R319 Hematuria, unspecified: Secondary | ICD-10-CM | POA: Diagnosis not present

## 2022-10-17 DIAGNOSIS — Z7984 Long term (current) use of oral hypoglycemic drugs: Secondary | ICD-10-CM

## 2022-10-17 DIAGNOSIS — F32A Depression, unspecified: Secondary | ICD-10-CM | POA: Diagnosis present

## 2022-10-17 DIAGNOSIS — Z79899 Other long term (current) drug therapy: Secondary | ICD-10-CM | POA: Diagnosis not present

## 2022-10-17 DIAGNOSIS — N390 Urinary tract infection, site not specified: Secondary | ICD-10-CM | POA: Diagnosis not present

## 2022-10-17 DIAGNOSIS — Z7985 Long-term (current) use of injectable non-insulin antidiabetic drugs: Secondary | ICD-10-CM

## 2022-10-17 LAB — URINALYSIS, ROUTINE W REFLEX MICROSCOPIC
Bacteria, UA: NONE SEEN
Bilirubin Urine: NEGATIVE
Glucose, UA: NEGATIVE mg/dL
Ketones, ur: NEGATIVE mg/dL
Nitrite: NEGATIVE
Protein, ur: 100 mg/dL — AB
RBC / HPF: >50 RBC/hpf (ref 0–5)
Specific Gravity, Urine: 1.012 (ref 1.005–1.030)
WBC, UA: >50 WBC/hpf (ref 0–5)
pH: 7 (ref 5.0–8.0)

## 2022-10-17 LAB — CBC WITH DIFFERENTIAL/PLATELET
Abs Immature Granulocytes: 0.05 10*3/uL (ref 0.00–0.07)
Basophils Absolute: 0 10*3/uL (ref 0.0–0.1)
Basophils Relative: 0 %
Eosinophils Absolute: 0.6 10*3/uL — ABNORMAL HIGH (ref 0.0–0.5)
Eosinophils Relative: 5 %
HCT: 35.1 % — ABNORMAL LOW (ref 36.0–46.0)
Hemoglobin: 11.1 g/dL — ABNORMAL LOW (ref 12.0–15.0)
Immature Granulocytes: 0 %
Lymphocytes Relative: 11 %
Lymphs Abs: 1.3 10*3/uL (ref 0.7–4.0)
MCH: 30 pg (ref 26.0–34.0)
MCHC: 31.6 g/dL (ref 30.0–36.0)
MCV: 94.9 fL (ref 80.0–100.0)
Monocytes Absolute: 1.2 10*3/uL — ABNORMAL HIGH (ref 0.1–1.0)
Monocytes Relative: 10 %
Neutro Abs: 9.3 10*3/uL — ABNORMAL HIGH (ref 1.7–7.7)
Neutrophils Relative %: 74 %
Platelets: 263 10*3/uL (ref 150–400)
RBC: 3.7 MIL/uL — ABNORMAL LOW (ref 3.87–5.11)
RDW: 18.1 % — ABNORMAL HIGH (ref 11.5–15.5)
WBC: 12.5 10*3/uL — ABNORMAL HIGH (ref 4.0–10.5)
nRBC: 0 % (ref 0.0–0.2)

## 2022-10-17 LAB — URINE CULTURE

## 2022-10-17 LAB — GLUCOSE, CAPILLARY: Glucose-Capillary: 101 mg/dL — ABNORMAL HIGH (ref 70–99)

## 2022-10-17 LAB — COMPREHENSIVE METABOLIC PANEL
ALT: 14 U/L (ref 0–44)
AST: 15 U/L (ref 15–41)
Albumin: 3.6 g/dL (ref 3.5–5.0)
Alkaline Phosphatase: 77 U/L (ref 38–126)
Anion gap: 9 (ref 5–15)
BUN: 18 mg/dL (ref 8–23)
CO2: 22 mmol/L (ref 22–32)
Calcium: 9.4 mg/dL (ref 8.9–10.3)
Chloride: 101 mmol/L (ref 98–111)
Creatinine, Ser: 1.6 mg/dL — ABNORMAL HIGH (ref 0.44–1.00)
GFR, Estimated: 33 mL/min — ABNORMAL LOW (ref >60)
Glucose, Bld: 123 mg/dL — ABNORMAL HIGH (ref 70–99)
Potassium: 3.6 mmol/L (ref 3.5–5.1)
Sodium: 132 mmol/L — ABNORMAL LOW (ref 135–145)
Total Bilirubin: 0.3 mg/dL (ref 0.3–1.2)
Total Protein: 8.4 g/dL — ABNORMAL HIGH (ref 6.5–8.1)

## 2022-10-17 LAB — CBG MONITORING, ED: Glucose-Capillary: 80 mg/dL (ref 70–99)

## 2022-10-17 LAB — LIPASE, BLOOD: Lipase: 39 U/L (ref 11–51)

## 2022-10-17 MED ORDER — PAROXETINE HCL 20 MG PO TABS
40.0000 mg | ORAL_TABLET | Freq: Every morning | ORAL | Status: DC
  Administered 2022-10-18 – 2022-10-19 (×2): 40 mg via ORAL
  Filled 2022-10-17 (×2): qty 2

## 2022-10-17 MED ORDER — PANTOPRAZOLE SODIUM 40 MG PO TBEC
40.0000 mg | DELAYED_RELEASE_TABLET | Freq: Two times a day (BID) | ORAL | Status: DC
  Administered 2022-10-17 – 2022-10-19 (×4): 40 mg via ORAL
  Filled 2022-10-17 (×4): qty 1

## 2022-10-17 MED ORDER — CIPROFLOXACIN HCL 500 MG PO TABS: 500.0000 mg | ORAL_TABLET | Freq: Two times a day (BID) | ORAL | 0 refills | Status: AC

## 2022-10-17 MED ORDER — SODIUM CHLORIDE 0.9 % IV SOLN
1.0000 g | INTRAVENOUS | Status: DC
  Administered 2022-10-17 – 2022-10-18 (×2): 1 g via INTRAVENOUS
  Filled 2022-10-17 (×2): qty 10

## 2022-10-17 MED ORDER — ASPIRIN 81 MG PO TBEC
81.0000 mg | DELAYED_RELEASE_TABLET | Freq: Every day | ORAL | Status: DC
  Administered 2022-10-18 – 2022-10-19 (×2): 81 mg via ORAL
  Filled 2022-10-17 (×2): qty 1

## 2022-10-17 MED ORDER — MORPHINE SULFATE (PF) 4 MG/ML IV SOLN
4.0000 mg | Freq: Once | INTRAVENOUS | Status: AC
  Administered 2022-10-17: 4 mg via INTRAVENOUS
  Filled 2022-10-17: qty 1

## 2022-10-17 MED ORDER — MOMETASONE FURO-FORMOTEROL FUM 100-5 MCG/ACT IN AERO
2.0000 | INHALATION_SPRAY | Freq: Two times a day (BID) | RESPIRATORY_TRACT | Status: DC
  Administered 2022-10-17 – 2022-10-19 (×4): 2 via RESPIRATORY_TRACT
  Filled 2022-10-17: qty 8.8

## 2022-10-17 MED ORDER — ACETAMINOPHEN 325 MG PO TABS
650.0000 mg | ORAL_TABLET | Freq: Four times a day (QID) | ORAL | Status: DC | PRN
  Administered 2022-10-18 – 2022-10-19 (×2): 650 mg via ORAL
  Filled 2022-10-17 (×2): qty 2

## 2022-10-17 MED ORDER — OXYCODONE HCL 5 MG PO TABS
5.0000 mg | ORAL_TABLET | ORAL | Status: DC | PRN
  Administered 2022-10-17 – 2022-10-18 (×2): 5 mg via ORAL
  Filled 2022-10-17 (×2): qty 1

## 2022-10-17 MED ORDER — SODIUM CHLORIDE 0.9 % IV SOLN: INTRAVENOUS | Status: DC

## 2022-10-17 MED ORDER — AMLODIPINE BESYLATE 5 MG PO TABS
10.0000 mg | ORAL_TABLET | Freq: Every day | ORAL | Status: DC
  Administered 2022-10-18 – 2022-10-19 (×2): 10 mg via ORAL
  Filled 2022-10-17 (×2): qty 2

## 2022-10-17 MED ORDER — LEVOFLOXACIN IN D5W 500 MG/100ML IV SOLN
500.0000 mg | Freq: Once | INTRAVENOUS | Status: AC
  Administered 2022-10-17: 500 mg via INTRAVENOUS
  Filled 2022-10-17: qty 100

## 2022-10-17 MED ORDER — ROSUVASTATIN CALCIUM 20 MG PO TABS
40.0000 mg | ORAL_TABLET | Freq: Every day | ORAL | Status: DC
  Administered 2022-10-17 – 2022-10-18 (×2): 40 mg via ORAL
  Filled 2022-10-17 (×2): qty 2

## 2022-10-17 MED ORDER — VITAMIN B-12 100 MCG PO TABS
500.0000 ug | ORAL_TABLET | Freq: Every day | ORAL | Status: DC
  Administered 2022-10-18 – 2022-10-19 (×2): 500 ug via ORAL
  Filled 2022-10-17 (×2): qty 5

## 2022-10-17 MED ORDER — ACETAMINOPHEN 650 MG RE SUPP: 650.0000 mg | Freq: Four times a day (QID) | RECTAL | Status: DC | PRN

## 2022-10-17 MED ORDER — HYDRALAZINE HCL 20 MG/ML IJ SOLN: 5.0000 mg | INTRAMUSCULAR | Status: DC | PRN

## 2022-10-17 MED ORDER — INSULIN ASPART 100 UNIT/ML IJ SOLN: 0.0000 [IU] | Freq: Every day | INTRAMUSCULAR | Status: DC

## 2022-10-17 MED ORDER — GABAPENTIN 300 MG PO CAPS
300.0000 mg | ORAL_CAPSULE | Freq: Three times a day (TID) | ORAL | Status: DC | PRN
  Administered 2022-10-17 – 2022-10-19 (×3): 300 mg via ORAL
  Filled 2022-10-17 (×3): qty 1

## 2022-10-17 MED ORDER — FOLIC ACID 1 MG PO TABS
1.0000 mg | ORAL_TABLET | Freq: Every day | ORAL | Status: DC
  Administered 2022-10-18 – 2022-10-19 (×2): 1 mg via ORAL
  Filled 2022-10-17 (×2): qty 1

## 2022-10-17 MED ORDER — SODIUM CHLORIDE 0.9 % IV BOLUS
500.0000 mL | Freq: Once | INTRAVENOUS | Status: AC
  Administered 2022-10-17: 500 mL via INTRAVENOUS

## 2022-10-17 MED ORDER — NITROGLYCERIN 0.4 MG SL SUBL: 0.4000 mg | SUBLINGUAL_TABLET | SUBLINGUAL | Status: DC | PRN

## 2022-10-17 MED ORDER — ISOSORBIDE MONONITRATE ER 30 MG PO TB24
15.0000 mg | ORAL_TABLET | Freq: Every day | ORAL | Status: DC
  Filled 2022-10-17: qty 1

## 2022-10-17 MED ORDER — ONDANSETRON HCL 4 MG/2ML IJ SOLN
4.0000 mg | Freq: Once | INTRAMUSCULAR | Status: AC
  Administered 2022-10-17: 4 mg via INTRAVENOUS
  Filled 2022-10-17: qty 2

## 2022-10-17 MED ORDER — INSULIN ASPART 100 UNIT/ML IJ SOLN: 0.0000 [IU] | Freq: Three times a day (TID) | INTRAMUSCULAR | Status: DC

## 2022-10-17 MED ORDER — MORPHINE SULFATE (PF) 2 MG/ML IV SOLN: 2.0000 mg | INTRAVENOUS | Status: DC | PRN

## 2022-10-17 MED ORDER — HEPARIN SODIUM (PORCINE) 5000 UNIT/ML IJ SOLN
5000.0000 [IU] | Freq: Three times a day (TID) | INTRAMUSCULAR | Status: DC
  Administered 2022-10-18 – 2022-10-19 (×2): 5000 [IU] via SUBCUTANEOUS
  Filled 2022-10-17 (×2): qty 1

## 2022-10-17 NOTE — Addendum Note (Signed)
Addended by: Kerri Perches on: 10/17/2022 12:51 PM   Modules accepted: Orders

## 2022-10-17 NOTE — ED Provider Triage Note (Signed)
Emergency Medicine Provider Triage Evaluation Note  NECHAMA BOEHM , a 77 y.o. female  was evaluated in triage.  Pt complains of flank pain.  Started at 9 AM this morning when she woke up sleep.  Located in the left flank.  Radiates to the left abdomen.  Endorses associated hematuria.  Denies nausea vomiting diarrhea.  No other urinary symptoms.  Denies fever and chills.  Review of Systems  Positive: See above Negative: See above  Physical Exam  BP 120/76 (BP Location: Right Arm)   Pulse 99   Temp 98.4 F (36.9 C) (Oral)   Resp 16   SpO2 92%  Gen:   Awake, no distress   Resp:  Normal effort  MSK:   Moves extremities without difficulty  Other:    Medical Decision Making  Medically screening exam initiated at 2:03 PM.  Appropriate orders placed.  BISSIE DINGEE was informed that the remainder of the evaluation will be completed by another provider, this initial triage assessment does not replace that evaluation, and the importance of remaining in the ED until their evaluation is complete.  Work up started   Gareth Eagle, PA-C 10/17/22 1404

## 2022-10-17 NOTE — ED Triage Notes (Addendum)
Pt from home bib [facility]. Pt c/o LLQ and flank pain. Recent UTI. A/o to person/place/situation, Not time. Nad at this time. Cbg 182 in route.

## 2022-10-17 NOTE — H&P (Signed)
TRH H&P   Patient Demographics:    Cheryl Abbott, is a 77 y.o. female  MRN: [id number]   DOB - 12/16/1945  Admit Date - 10/17/2022  Outpatient Primary MD for the patient is Kerri Perches, MD  Referring MD/NP/PA: Dr Roxan Hockey    Patient coming from: home  Chief Complaint  Patient presents with   Flank Pain      HPI:    Cheryl Abbott  is a 77 y.o. female, with a past medical history of stage III CKD, chronic shoulder pain, chronic back pain, osteoarthritis, anxiety disorder, hypertension, hyperlipidemia, type 2 diabetes, nephrolithiasis and UTIs with recent hospitalization due to complicated UTI from right hydronephrosis, status post double-J stent placement. -Patient presents to ED today secondary to flank pain, she reports recent right stent placement, with no complaints, but reports this morning when she woke up from sleep, she has been complaining of left flank pain, radiating to left abdomen and groin, as well she does report hematuria, she denies nausea, vomiting and diarrhea, no fever or chills -In ED her workup was significant for hematuria, pyuria, as well CT renal protocol has been obtained which was significant for new left ureteral hydronephrosis with kidney stone, so she was started on IV antibiotics, and [facility] consulted to admit.    Review of systems:      A full 10 point Review of Systems was done, except as stated above, all other Review of Systems were negative.   With Past History of the following :    Past Medical History:  Diagnosis Date   Anxiety disorder    Arthritis    Chronic back pain    Chronic pain of both shoulders    per pt told due to cervical spine pinched nerve   CKD (chronic kidney disease), stage III [facility])    nephrologist--- dr Wolfgang Phoenix;   due to tubular necrosis   Claustrophobia    Depression    Edema of both lower  extremities    GERD (gastroesophageal reflux disease)    Headache    Hemorrhoids    w/ intermittant bleeding   History of adenomatous polyp of colon    History of kidney stones    Hx-TIA (transient ischemic attack) 02/26/2016   per pt no residual   Hydronephrosis, left    followed by urologist--- dr Retta Diones;   persistant   Hyperlipidemia    Hypertension    Iron deficiency    followed by [facility] w/ hx iron infusions   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   oncologist--- [facility];  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, active survillence   Lactose intolerance in adult 02/01/2016   Left ureteral calculus    Lumbar disc disease with radiculopathy    Nephrolithiasis    per CT 08-11-2021  left > right   Neuropathy, peripheral  Nocturia more than twice per night    Normocytic anemia    OSA (obstructive sleep apnea)    per study 2007;   per pt no Bipap use since 2021   Renal atrophy, bilateral    Renal cyst, right    Sciatica    Sigmoid diverticulosis    Type 2 diabetes mellitus (HCC)       Past Surgical History:  Procedure Laterality Date   BIOPSY N/A 03/17/2013   Procedure: GASTRIC BIOPSIES;  Surgeon: West Bali, MD;  Location: [facility];  Service: Endoscopy;  Laterality: N/A;   BIOPSY  06/10/2015   Procedure: BIOPSY;  Surgeon: West Bali, MD;  [facility];  Service: Endoscopy;;  gastric biopsy   CARDIAC CATHETERIZATION  05/11/2003   @ [facility] by dr t. Tresa Endo;  Abnormal cardiolite/   Normal coronary arteries and normal LVF,  ef  63%   CARDIOVASCULAR STRESS TEST  01/01/2014   normal lexiscan cardiolite/  no ischemia/ infarct/  normal LV function and wall motion ,  ef 81%   COLONOSCOPY WITH PROPOFOL N/A 02/26/2017   Procedure: COLONOSCOPY WITH PROPOFOL;  Surgeon: West Bali, MD;  [facility];  Service: Endoscopy;  Laterality: N/A;  11:00am   COLONOSCOPY WITH PROPOFOL N/A 09/22/2019    Procedure: COLONOSCOPY WITH PROPOFOL;  Surgeon: West Bali, MD;  [facility];  Service: Endoscopy;  Laterality: N/A;  1:15   CYSTO/   RIGHT URETEROSOCOPY LASER LITHOTRIPSY STONE EXTRACTION  10/13/2004   CYSTO/  RIGHT RETROGRADE PYELOGRAM/  PLACMENT RIGHT URETERAL STENT  01/10/2010   CYSTOSCOPY W/ URETERAL STENT PLACEMENT Right 08/19/2015   Procedure: CYSTOSCOPY WITH RETROGRADE PYELOGRAM/URETERAL STENT PLACEMENT;  Surgeon: Marcine Matar, MD;  [facility];  Service: Urology;  Laterality: Right;   CYSTOSCOPY W/ URETERAL STENT PLACEMENT Left 02/23/2020   Procedure: CYSTOSCOPY LEFT  RETROGRADE PYELOGRAM LEFT URETEROSCOPY URETERAL STENT PLACEMENT;  Surgeon: Marcine Matar, MD;  [facility];  Service: Urology;  Laterality: Left;   CYSTOSCOPY W/ URETERAL STENT PLACEMENT Right 09/24/2022   Procedure: CYSTOSCOPY WITH RETROGRADE PYELOGRAM/URETERAL STENT PLACEMENT;  Surgeon: Malen Gauze, MD;  [facility];  Service: Urology;  Laterality: Right;   CYSTOSCOPY WITH RETROGRADE PYELOGRAM, URETEROSCOPY AND STENT PLACEMENT Left 10/21/2014   Procedure: CYSTOSCOPY WITH RETROGRADE PYELOGRAM, URETEROSCOPY AND STENT PLACEMENT;  Surgeon: Marcine Matar, MD;  Location[facility];  Service: Urology;  Laterality: Left;   CYSTOSCOPY WITH RETROGRADE PYELOGRAM, URETEROSCOPY AND STENT PLACEMENT Right 11/22/2015   Procedure: CYSTOSCOPY, RIGHT URETERAL STENT REMOVAL; RIGHT RETROGRADE PYELOGRAM, RIGHT URETEROSCOPY WITH BALLOON DILATION; RIGHT URETERAL STENT PLACEMENT;  Surgeon: Marcine Matar, MD;  [facility];  Service: Urology;  Laterality: Right;   CYSTOSCOPY WITH RETROGRADE PYELOGRAM, URETEROSCOPY AND STENT PLACEMENT Left 10/18/2020   Procedure: CYSTOSCOPY WITH LEFT RETROGRADE PYELOGRAM, LEFT URETEROSCOPY  WITH LASER AND LEFT URETERAL STENT PLACEMENT;  Surgeon: Marcine Matar, MD;  [facility];  Service: Urology;  Laterality: Left;   CYSTOSCOPY WITH  RETROGRADE PYELOGRAM, URETEROSCOPY AND STENT PLACEMENT Left 10/26/2021   Procedure: CYSTOSCOPY WITH RETROGRADE PYELOGRAM, URETEROSCOPY, STONE EXTRACTION  AND STENT PLACEMENT;  Surgeon: Marcine Matar, MD;  Location: [facility];  Service: Urology;  Laterality: Left;   CYSTOSCOPY/RETROGRADE/URETEROSCOPY/STONE EXTRACTION WITH BASKET Bilateral 08/02/2015   Procedure: CYSTOSCOPY; BILATERAL RETROGRADE PYELOGRAMS; BILATERAL URETEROSCOPY, STONE EXTRACTION WITH BASKET;  Surgeon: Marcine Matar, MD;  [facility];  Service: Urology;  Laterality: Bilateral;   CYSTOSCOPY/RETROGRADE/URETEROSCOPY/STONE EXTRACTION WITH BASKET Right  06/28/2015   Procedure: CYSTOSCOPY/RETROGRADE/URETEROSCOPY/STONE EXTRACTION WITH BASKET, RIGHT URETERAL DOUBLE J STENT PLACEMENT;  Surgeon: Marcine Matar, MD;  [facility];  Service: Urology;  Laterality: Right;   ESOPHAGOGASTRODUODENOSCOPY (EGD) WITH PROPOFOL N/A 03/17/2013   Procedure: ESOPHAGOGASTRODUODENOSCOPY (EGD) WITH PROPOFOL;  Surgeon: West Bali, MD;  [facility];  Service: Endoscopy;  Laterality: N/A;   ESOPHAGOGASTRODUODENOSCOPY (EGD) WITH PROPOFOL N/A 06/10/2015   Distal gastritis, distal esophageal stricture s/p dilation   ESOPHAGOGASTRODUODENOSCOPY (EGD) WITH PROPOFOL N/A 02/26/2017   Procedure: ESOPHAGOGASTRODUODENOSCOPY (EGD) WITH PROPOFOL;  Surgeon: West Bali, MD;  [facility];  Service: Endoscopy;  Laterality: N/A;   FLEXIBLE SIGMOIDOSCOPY  09/11/2011   [id number] Internal hemorrhoids   HOLMIUM LASER APPLICATION Left 10/21/2014   Procedure: HOLMIUM LASER APPLICATION;  Surgeon: Marcine Matar, MD;  Location: [facility];  Service: Urology;  Laterality: Left;   HOLMIUM LASER APPLICATION Right 06/28/2015   Procedure: HOLMIUM LASER APPLICATION;  Surgeon: Marcine Matar, MD;  [facility];  Service: Urology;  Laterality: Right;   HOLMIUM LASER APPLICATION  02/23/2020   Procedure:  HOLMIUM LASER APPLICATION;  Surgeon: Marcine Matar, MD;  [facility];  Service: Urology;;   HOLMIUM LASER APPLICATION Left 10/18/2020   Procedure: HOLMIUM LASER APPLICATION;  Surgeon: Marcine Matar, MD;  [facility];  Service: Urology;  Laterality: Left;   MASS EXCISION Left 03/04/2015   Procedure: EXCISION OF SOFT TISSUE NEOPLASM LEFT ARM;  Surgeon: Franky Macho, MD;  Location[facility];  Service: General;  Laterality: Left;   PERCUTANEOUS NEPHROSTOLITHOTOMY Bilateral 05/2010   @[facility]    POLYPECTOMY N/A 03/17/2013   Procedure: GASTRIC POLYPECTOMY;  Surgeon: West Bali, MD;  [facility];  Service: Endoscopy;  Laterality: N/A;   POLYPECTOMY  02/26/2017   Procedure: POLYPECTOMY;  Surgeon: West Bali, MD;  [facility];  Service: Endoscopy;;  polyp at cecum, ascending colon polyps x3, hepatic flexure polyps x8, transverse colon polyps x8    POLYPECTOMY  09/22/2019   Procedure: POLYPECTOMY;  Surgeon: West Bali, MD;  [facility];  Service: Endoscopy;;   REMOVAL RIGHT THIGH CYST  2006   SAVORY DILATION N/A 03/17/2013   Procedure: SAVORY DILATION;  Surgeon: West Bali, MD;  [facility];  Service: Endoscopy;  Laterality: N/A;  #12.8, 14, 15, 16 dilators used   SAVORY DILATION N/A 06/10/2015   Procedure: SAVORY DILATION;  Surgeon: West Bali, MD;  [facility];  Service: Endoscopy;  Laterality: N/A;   VAGINAL HYSTERECTOMY  1970's      Social History:     Social History   Tobacco Use   Smoking status: Former    Packs/day: 1.00    Years: 20.00    Additional pack years: 0.00    Total pack years: 20.00    Types: Cigarettes    Start date: 09/21/1974    Quit date: 09/20/1988    Years since quitting: 34.0    Passive exposure: Past   Smokeless tobacco: Never  Substance Use Topics   Alcohol use: No    Alcohol/week: 0.0 standard drinks of alcohol       Family History :     Family History  Problem Relation  Age of Onset   Hypertension Mother    Diabetes Mother    Heart failure Mother    Dementia Mother    Emphysema Father    Hypertension Father    Diabetes Brother    GER disease Brother  Hypertension Sister    Hypertension Sister    Cancer Other        family history    Diabetes Other        family history    Heart defect Other        famiily history    Arthritis Other        family history    Anesthesia problems Neg Hx    Hypotension Neg Hx    Malignant hyperthermia Neg Hx    Pseudochol deficiency Neg Hx    Colon cancer Neg Hx       Home Medications:   Prior to Admission medications   Medication Sig Start Date End Date Taking? Authorizing Provider  acetaminophen (TYLENOL) 500 MG tablet Take 2 tablets (1,000 mg total) by mouth every 6 (six) hours as needed for mild pain. 09/03/22   Vickki Hearing, MD  amLODipine (NORVASC) 10 MG tablet TAKE 1 TABLET BY MOUTH EVERY DAY Patient taking differently: Take 10 mg by mouth daily. 05/11/22   Kerri Perches, MD  aspirin EC 81 MG tablet Take 81 mg by mouth daily. Swallow whole.    [provider]  cefdinir (OMNICEF) 300 MG capsule Take 1 capsule (300 mg total) by mouth every 12 (twelve) hours. 09/27/22   Catarina Hartshorn, MD  ciprofloxacin (CIPRO) 500 MG tablet Take 1 tablet (500 mg total) by mouth 2 (two) times daily for 14 days. 10/17/22 10/31/22  Kerri Perches, MD  Continuous Blood Gluc Receiver (FREESTYLE LIBRE 2 READER) DEVI Use to test daily. Dx: E11.21 07/10/22   Kerri Perches, MD  Continuous Blood Gluc Sensor (FREESTYLE [place] 2 SENSOR) MISC Use to test daily dx: E11.21 07/10/22   Kerri Perches, MD  diclofenac Sodium (VOLTAREN) 1 % GEL APPLY 4 G TOPICALLY 4 (FOUR) TIMES DAILY AS NEEDED (PAIN). 06/18/22   Vickki Hearing, MD  diphenhydramine-acetaminophen (TYLENOL PM) 25-500 MG TABS tablet Take 2 tablets by mouth at bedtime.    [provider]  Dulaglutide (TRULICITY) 0.75 MG/0.5ML SOPN INJECT 0.75  MG INTO THE SKIN WEEKLY Patient taking differently: Inject 0.75 mg into the skin every Thursday. 07/12/22   Kerri Perches, MD  folic acid (FOLVITE) 1 MG tablet Take 1 tablet (1 mg total) by mouth daily. 09/28/22   Catarina Hartshorn, MD  gabapentin (NEURONTIN) 300 MG capsule TAKE 1 CAPSULE BY MOUTH 3 TIMES DAILY AS NEEDED. Patient taking differently: Take 300 mg by mouth 3 (three) times daily as needed (pain). TAKE 1 CAPSULE BY MOUTH 3 TIMES DAILY AS NEEDED. 09/03/22   Vickki Hearing, MD  glucose blood [facility] ULTRA) test strip USE AS DIRECTED 08/13/22   Kerri Perches, MD  isosorbide mononitrate (IMDUR) 30 MG 24 hr tablet Take 0.5 tablets (15 mg total) by mouth daily. 08/14/22 08/09/23  Antoine Poche, MD  nitroGLYCERIN (NITROSTAT) 0.4 MG SL tablet Place 1 tablet (0.4 mg total) under the tongue every 5 (five) minutes as needed for chest pain. Up to 3 tablets, then call 911. 07/19/21 01/27/79  Strader, Lennart Pall, PA-C  OneTouch Delica Lancets 30G MISC Once daily testing dx e11.9 01/07/20   Kerri Perches, MD  pantoprazole (PROTONIX) 40 MG tablet TAKE 1 TABLET BY MOUTH TWICE A DAY 07/12/22   Kerri Perches, MD  PARoxetine (PAXIL) 40 MG tablet TAKE 1 TABLET (40 MG TOTAL) BY MOUTH IN THE MORNING 09/03/22   Kerri Perches, MD  rosuvastatin (CRESTOR) 40 MG tablet Take 1  tablet (40 mg total) by mouth daily. 08/14/22 08/09/23  Antoine Poche, MD  SYMBICORT 80-4.5 MCG/ACT inhaler INHALE 2 PUFFS INTO THE LUNGS TWICE A DAY Patient taking differently: Inhale 2 puffs into the lungs 2 (two) times daily. 05/21/22   Kerri Perches, MD  TRADJENTA 5 MG TABS tablet TAKE 1 TABLET BY MOUTH EVERY DAY 06/18/22   Kerri Perches, MD  traMADol (ULTRAM) 50 MG tablet TAKE 1 TABLET BY MOUTH EVERY 6 HOURS AS NEEDED. 10/04/22   Vickki Hearing, MD  UNABLE TO FIND Diabetic shoes x 1 pair, inserts x 3 pair  DX E11.9 05/09/21   Kerri Perches, MD  vitamin B-12 (VITAMIN B12) 500 MCG tablet Take 1  tablet (500 mcg total) by mouth daily. 09/28/22   Catarina Hartshorn, MD     Allergies:     Allergies  Allergen Reactions   Ace Inhibitors Cough    Patient doesn't recall  Other Reaction(s): GI Intolerance   Cephalexin Diarrhea and Nausea And Vomiting    Other Reaction(s): GI Intolerance   Nitrofurantoin Diarrhea and Nausea And Vomiting   Penicillins Hives and Other (See Comments)     Physical Exam:   Vitals  Blood pressure 136/89, pulse 89, temperature 98.4 F (36.9 C), temperature source Oral, resp. rate 16, SpO2 90 %.   1. General well developed female,  lying in bed in NAD,    2. Normal affect and insight, Not Suicidal or Homicidal, Awake Alert, Oriented X 3.  3. No F.N deficits, ALL C.Nerves Intact, Strength 5/5 all 4 extremities, Sensation intact all 4 extremities, Plantars down going.  4. Ears and Eyes appear Normal, Conjunctivae clear, PERRLA. Moist Oral Mucosa.  5. Supple Neck, No JVD, No cervical lymphadenopathy appriciated, No Carotid Bruits.  6. Symmetrical Chest wall movement, Good air movement bilaterally, CTAB.  7. RRR, No Gallops, Rubs or Murmurs, No Parasternal Heave.  8. Positive Bowel Sounds, Abdomen Soft, No tenderness, No organomegaly appriciated,No rebound -guarding or rigidity.  9.  No Cyanosis, Normal Skin Turgor, No Skin Rash or Bruise.  10. Good muscle tone,  joints appear normal , no effusions, Normal ROM.   Data Review:    CBC Recent Labs  Lab 10/11/22 1109 10/17/22 1334  WBC 10.2 12.5*  HGB 11.7 11.1*  HCT 35.5 35.1*  PLT 349 263  MCV 91 94.9  MCH 29.9 30.0  MCHC 33.0 31.6  RDW 16.0* 18.1*  LYMPHSABS 1.6 1.3  MONOABS  --  1.2*  EOSABS 0.3 0.6*  BASOSABS 0.1 0.0   ------------------------------------------------------------------------------------------------------------------  Chemistries  Recent Labs  Lab 10/11/22 1109 10/17/22 1334  NA 138 132*  K 3.9 3.6  CL 101 101  CO2 22 22  GLUCOSE 102* 123*  BUN 15 18   CREATININE 1.50* 1.60*  CALCIUM 10.0 9.4  AST  --  15  ALT  --  14  ALKPHOS  --  77  BILITOT  --  0.3   ------------------------------------------------------------------------------------------------------------------ estimated creatinine clearance is 31.6 mL/min (A) (by C-G formula based on SCr of 1.6 mg/dL (H)). ------------------------------------------------------------------------------------------------------------------ No results for input(s): "TSH", "T4TOTAL", "T3FREE", "THYROIDAB" in the last 72 hours.  Invalid input(s): "FREET3"  Coagulation profile No results for input(s): "INR", "PROTIME" in the last 168 hours. ------------------------------------------------------------------------------------------------------------------- No results for input(s): "DDIMER" in the last 72 hours. -------------------------------------------------------------------------------------------------------------------  Cardiac Enzymes No results for input(s): "CKMB", "TROPONINI", "MYOGLOBIN" in the last 168 hours.  Invalid input(s): "CK" ------------------------------------------------------------------------------------------------------------------ No results found for: "BNP"   ---------------------------------------------------------------------------------------------------------------  Urinalysis  Component Value Date/Time   COLORURINE YELLOW 10/17/2022 1658   APPEARANCEUR CLOUDY (A) 10/17/2022 1658   APPEARANCEUR Clear 01/11/2022 1408   LABSPEC 1.012 10/17/2022 1658   PHURINE 7.0 10/17/2022 1658   GLUCOSEU NEGATIVE 10/17/2022 1658   HGBUR LARGE (A) 10/17/2022 1658   HGBUR moderate 02/07/2010 0907   BILIRUBINUR NEGATIVE 10/17/2022 1658   BILIRUBINUR negative 10/12/2022 0953   BILIRUBINUR Negative 01/11/2022 1408   KETONESUR NEGATIVE 10/17/2022 1658   PROTEINUR 100 (A) 10/17/2022 1658   UROBILINOGEN 0.2 10/12/2022 0953   UROBILINOGEN 0.2 03/01/2015 1600   NITRITE NEGATIVE  10/17/2022 1658   LEUKOCYTESUR LARGE (A) 10/17/2022 1658    ----------------------------------------------------------------------------------------------------------------   Imaging Results:    CT RENAL STONE STUDY  Result Date: 10/17/2022 CLINICAL DATA:  Left lower quadrant abdominal/flank pain. Recent urinary tract infection. EXAM: CT ABDOMEN AND PELVIS WITHOUT CONTRAST TECHNIQUE: Multidetector CT imaging of the abdomen and pelvis was performed following the standard protocol without IV contrast. RADIATION DOSE REDUCTION: This exam was performed according to the departmental dose-optimization program which includes automated exposure control, adjustment of the mA and/or kV according to patient size and/or use of iterative reconstruction technique. COMPARISON:  CT 09/23/2022, 02/13/2022 FINDINGS: Lower chest: Bandlike subsegmental atelectasis in the right middle and lower lobe related to elevated hemidiaphragm. 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 No acute airspace disease. Hepatobiliary: Unremarkable unenhanced appearance of the liver. Gallbladder physiologically distended, no calcified stone. No biliary dilatation. Pancreas: No ductal dilatation or inflammation. Spleen: Normal in size without focal abnormality. Adrenals/Urinary Tract: No adrenal nodule. Placement of right nephroureteral stent since prior exam. Proximal pigtails coiled in the dilated renal pelvis, the distal pigtail is coiled in the bladder. 3 mm stone fragment adjacent to the distal stent series 2, image 72. There is diminished right hydronephrosis from prior exam with persistent dilatation of the renal pelvis and ureter. Mild background thinning of right renal parenchyma. There is no evidence of renal fluid collection. Hypodense right renal lesion on prior exam is not well seen in the absence of IV contrast. Progressive left hydroureteronephrosis from prior. There is a punctate stone in the distal left ureter, series 5,  image 77 causing proximal obstruction. Multiple additional punctate nonobstructing left intrarenal calculi. There is mild thinning of the left renal parenchyma. Stomach/Bowel: Unremarkable appearance of the stomach. Small duodenal diverticulum. There is no bowel obstruction or inflammation. Normal appendix. Left colonic diverticulosis, most prominent in the sigmoid. No diverticulitis. Vascular/Lymphatic: Mild aortic atherosclerosis. No aneurysm. No enlarged lymph nodes in the abdomen or pelvis. Reproductive: Status post hysterectomy. No adnexal masses. Other: No free air or free fluid. Scattered subcutaneous densities in the lower anterior abdominal wall are typical of medication injection sites. Tiny fat containing umbilical hernia. There is minimal fat in both inguinal canals. Musculoskeletal: Lumbar facet hypertrophy. There are no acute or suspicious osseous abnormalities. Left gluteal subcutaneous granulomas. IMPRESSION: 1. Punctate distal left ureteric stone causing moderate proximal hydroureteronephrosis. Increased hydronephrosis from prior exam. Multiple additional punctate nonobstructing left intrarenal calculi. 2. Placement of right nephroureteral stent since prior exam. Diminished right hydronephrosis with persistent renal pelvis dilatation. 3 mm stone is seen adjacent to the distal portion of the stent. 3. Colonic diverticulosis without diverticulitis. Aortic Atherosclerosis (ICD10-I70.0). Electronically Signed   By: Narda Rutherford M.D.   On: 10/17/2022 15:06       Assessment & Plan:    Principal Problem:   Complicated UTI (urinary tract infection) Active Problems:   Anxiety and depression   CKD (  chronic kidney disease) stage 3, GFR 30-59 ml/min (HCC)   Hydroureteronephrosis   Complicated UTI with new left ureteral hydronephrosis -With recent hospitalization for complicated UTI with right hydronephrosis, status post JJ stent placement. -Appears to be with new left-sided  hydronephrosis -Follow on urine cultures. -started on IV Rocephin. 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  Code Status Full  Likely DC to  home  Condition GUARDED    Consults called: urology requested in EPIC    Admission status: inpatient    Time spent in minutes : 70 minutes   Huey Bienenstock M.D on 10/17/2022 at 8:20 PM   [facility] - Office  [phone]

## 2022-10-17 NOTE — ED Provider Notes (Signed)
[place] [facility] AT [facility] Provider Note   CSN: [id number] Arrival date & time: 10/17/22  1255     History  Chief Complaint  Patient presents with   Flank Pain   HPI Cheryl Abbott is a 77 y.o. female with CKD, diabetes, recent history of right sided kidney stone with stent presenting for flank pain  Started at 9 AM this morning when she woke up sleep.  Located in the left flank.  Radiates to the left abdomen.  Endorses associated hematuria.  Denies nausea vomiting diarrhea.  No other urinary symptoms.  Denies fever and chills.    Flank Pain       Home Medications Prior to Admission medications   Medication Sig Start Date End Date Taking? Authorizing Provider  acetaminophen (TYLENOL) 500 MG tablet Take 2 tablets (1,000 mg total) by mouth every 6 (six) hours as needed for mild pain. 09/03/22   Vickki Hearing, MD  amLODipine (NORVASC) 10 MG tablet TAKE 1 TABLET BY MOUTH EVERY DAY Patient taking differently: Take 10 mg by mouth daily. 05/11/22   Kerri Perches, MD  aspirin EC 81 MG tablet Take 81 mg by mouth daily. Swallow whole.    [provider]  cefdinir (OMNICEF) 300 MG capsule Take 1 capsule (300 mg total) by mouth every 12 (twelve) hours. 09/27/22   Catarina Hartshorn, MD  ciprofloxacin (CIPRO) 500 MG tablet Take 1 tablet (500 mg total) by mouth 2 (two) times daily for 14 days. 10/17/22 10/31/22  Kerri Perches, MD  Continuous Blood Gluc Receiver (FREESTYLE LIBRE 2 READER) DEVI Use to test daily. Dx: E11.21 07/10/22   Kerri Perches, MD  Continuous Blood Gluc Sensor (FREESTYLE [place] 2 SENSOR) MISC Use to test daily dx: E11.21 07/10/22   Kerri Perches, MD  diclofenac Sodium (VOLTAREN) 1 % GEL APPLY 4 G TOPICALLY 4 (FOUR) TIMES DAILY AS NEEDED (PAIN). 06/18/22   Vickki Hearing, MD  diphenhydramine-acetaminophen (TYLENOL PM) 25-500 MG TABS tablet Take 2 tablets by mouth at bedtime.    [provider]  Dulaglutide  (TRULICITY) 0.75 MG/0.5ML SOPN INJECT 0.75 MG INTO THE SKIN WEEKLY Patient taking differently: Inject 0.75 mg into the skin every Thursday. 07/12/22   Kerri Perches, MD  folic acid (FOLVITE) 1 MG tablet Take 1 tablet (1 mg total) by mouth daily. 09/28/22   Catarina Hartshorn, MD  gabapentin (NEURONTIN) 300 MG capsule TAKE 1 CAPSULE BY MOUTH 3 TIMES DAILY AS NEEDED. Patient taking differently: Take 300 mg by mouth 3 (three) times daily as needed (pain). TAKE 1 CAPSULE BY MOUTH 3 TIMES DAILY AS NEEDED. 09/03/22   Vickki Hearing, MD  glucose blood [facility] ULTRA) test strip USE AS DIRECTED 08/13/22   Kerri Perches, MD  isosorbide mononitrate (IMDUR) 30 MG 24 hr tablet Take 0.5 tablets (15 mg total) by mouth daily. 08/14/22 08/09/23  Antoine Poche, MD  nitroGLYCERIN (NITROSTAT) 0.4 MG SL tablet Place 1 tablet (0.4 mg total) under the tongue every 5 (five) minutes as needed for chest pain. Up to 3 tablets, then call 911. 07/19/21 01/27/79  [place] Ouch, Lennart Pall, PA-C  OneTouch Delica Lancets 30G MISC Once daily testing dx e11.9 01/07/20   Kerri Perches, MD  pantoprazole (PROTONIX) 40 MG tablet TAKE 1 TABLET BY MOUTH TWICE A DAY 07/12/22   Kerri Perches, MD  PARoxetine (PAXIL) 40 MG tablet TAKE 1 TABLET (40 MG TOTAL) BY MOUTH IN THE MORNING 09/03/22  Kerri Perches, MD  rosuvastatin (CRESTOR) 40 MG tablet Take 1 tablet (40 mg total) by mouth daily. 08/14/22 08/09/23  Antoine Poche, MD  SYMBICORT 80-4.5 MCG/ACT inhaler INHALE 2 PUFFS INTO THE LUNGS TWICE A DAY Patient taking differently: Inhale 2 puffs into the lungs 2 (two) times daily. 05/21/22   Kerri Perches, MD  TRADJENTA 5 MG TABS tablet TAKE 1 TABLET BY MOUTH EVERY DAY 06/18/22   Kerri Perches, MD  traMADol (ULTRAM) 50 MG tablet TAKE 1 TABLET BY MOUTH EVERY 6 HOURS AS NEEDED. 10/04/22   Vickki Hearing, MD  UNABLE TO FIND Diabetic shoes x 1 pair, inserts x 3 pair  DX E11.9 05/09/21   Kerri Perches, MD  vitamin  B-12 (VITAMIN B12) 500 MCG tablet Take 1 tablet (500 mcg total) by mouth daily. 09/28/22   Catarina Hartshorn, MD      Allergies    Ace inhibitors, Cephalexin, Nitrofurantoin, and Penicillins    Review of Systems   Review of Systems  Genitourinary:  Positive for flank pain.    Physical Exam   Vitals:   10/17/22 1700 10/17/22 1730  BP: 133/88 136/89  Pulse: 99 89  Resp:    Temp:    SpO2: (!) 68% 90%    CONSTITUTIONAL:  well-appearing, NAD NEURO:  Alert and oriented x 3, CN 3-12 grossly intact EYES:  eyes equal and reactive ENT/NECK:  Supple, no stridor  CARDIO:  regular rate and rhythm, appears well-perfused  PULM:  No respiratory distress, CTAB GI/GU:  non-distended, soft, non tender, no cva tenderness MSK/SPINE:  No gross deformities, no edema, moves all extremities  SKIN:  no rash, atraumatic   *Additional and/or pertinent findings included in MDM below    ED Results / Procedures / Treatments   Labs (all labs ordered are listed, but only abnormal results are displayed) Labs Reviewed  CBC WITH DIFFERENTIAL/PLATELET - Abnormal; Notable for the following components:      Result Value   WBC 12.5 (*)    RBC 3.70 (*)    Hemoglobin 11.1 (*)    HCT 35.1 (*)    RDW 18.1 (*)    Neutro Abs 9.3 (*)    Monocytes Absolute 1.2 (*)    Eosinophils Absolute 0.6 (*)    All other components within normal limits  COMPREHENSIVE METABOLIC PANEL - Abnormal; Notable for the following components:   Sodium 132 (*)    Glucose, Bld 123 (*)    Creatinine, Ser 1.60 (*)    Total Protein 8.4 (*)    GFR, Estimated 33 (*)    All other components within normal limits  URINALYSIS, ROUTINE W REFLEX MICROSCOPIC - Abnormal; Notable for the following components:   APPearance CLOUDY (*)    Hgb urine dipstick LARGE (*)    Protein, ur 100 (*)    Leukocytes,Ua LARGE (*)    All other components within normal limits  URINE CULTURE  LIPASE, BLOOD  CBG MONITORING, ED    EKG None  Radiology CT RENAL  STONE STUDY  Result Date: 10/17/2022 CLINICAL DATA:  Left lower quadrant abdominal/flank pain. Recent urinary tract infection. EXAM: CT ABDOMEN AND PELVIS WITHOUT CONTRAST TECHNIQUE: Multidetector CT imaging of the abdomen and pelvis was performed following the standard protocol without IV contrast. RADIATION DOSE REDUCTION: This exam was performed according to the departmental dose-optimization program which includes automated exposure control, adjustment of the mA and/or kV according to patient size and/or use of iterative reconstruction technique. COMPARISON:  CT 09/23/2022, 02/13/2022 FINDINGS: Lower chest: Bandlike subsegmental atelectasis in the right middle and lower lobe related to elevated hemidiaphragm. 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 No acute airspace disease. Hepatobiliary: Unremarkable unenhanced appearance of the liver. Gallbladder physiologically distended, no calcified stone. No biliary dilatation. Pancreas: No ductal dilatation or inflammation. Spleen: Normal in size without focal abnormality. Adrenals/Urinary Tract: No adrenal nodule. Placement of right nephroureteral stent since prior exam. Proximal pigtails coiled in the dilated renal pelvis, the distal pigtail is coiled in the bladder. 3 mm stone fragment adjacent to the distal stent series 2, image 72. There is diminished right hydronephrosis from prior exam with persistent dilatation of the renal pelvis and ureter. Mild background thinning of right renal parenchyma. There is no evidence of renal fluid collection. Hypodense right renal lesion on prior exam is not well seen in the absence of IV contrast. Progressive left hydroureteronephrosis from prior. There is a punctate stone in the distal left ureter, series 5, image 77 causing proximal obstruction. Multiple additional punctate nonobstructing left intrarenal calculi. There is mild thinning of the left renal parenchyma. Stomach/Bowel: Unremarkable appearance of the  stomach. Small duodenal diverticulum. There is no bowel obstruction or inflammation. Normal appendix. Left colonic diverticulosis, most prominent in the sigmoid. No diverticulitis. Vascular/Lymphatic: Mild aortic atherosclerosis. No aneurysm. No enlarged lymph nodes in the abdomen or pelvis. Reproductive: Status post hysterectomy. No adnexal masses. Other: No free air or free fluid. Scattered subcutaneous densities in the lower anterior abdominal wall are typical of medication injection sites. Tiny fat containing umbilical hernia. There is minimal fat in both inguinal canals. Musculoskeletal: Lumbar facet hypertrophy. There are no acute or suspicious osseous abnormalities. Left gluteal subcutaneous granulomas. IMPRESSION: 1. Punctate distal left ureteric stone causing moderate proximal hydroureteronephrosis. Increased hydronephrosis from prior exam. Multiple additional punctate nonobstructing left intrarenal calculi. 2. Placement of right nephroureteral stent since prior exam. Diminished right hydronephrosis with persistent renal pelvis dilatation. 3 mm stone is seen adjacent to the distal portion of the stent. 3. Colonic diverticulosis without diverticulitis. Aortic Atherosclerosis (ICD10-I70.0). Electronically Signed   By: Narda Rutherford M.D.   On: 10/17/2022 15:06    Procedures Procedures    Medications Ordered in ED Medications  sodium chloride 0.9 % bolus 500 mL (0 mLs Intravenous Stopped 10/17/22 1629)  morphine (PF) 4 MG/ML injection 4 mg (4 mg Intravenous Given 10/17/22 1603)  ondansetron (ZOFRAN) injection 4 mg (4 mg Intravenous Given 10/17/22 1603)  levofloxacin (LEVAQUIN) IVPB 500 mg (500 mg Intravenous New Bag/Given 10/17/22 1806)  morphine (PF) 4 MG/ML injection 4 mg (4 mg Intravenous Given 10/17/22 1807)    ED Course/ Medical Decision Making/ A&P                             Medical Decision Making Amount and/or Complexity of Data Reviewed Labs: ordered. Radiology:  ordered.  Risk Prescription drug management. Decision regarding hospitalization.   Initial Impression and Ddx 77 year old female who is well-appearing presenting for flank pain.  Exam was unremarkable.  DDx includes stone with or without obstruction, hydronephrosis, pyelonephritis, other UTI, other intra-abdominal infection Patient PMH that increases complexity of ED encounter:    Interpretation of Diagnostics I independent reviewed and interpreted the labs as followed: Hematuria, pyuria, leukocytosis, hyponatremia  - I independently visualized the following imaging with scope of interpretation limited to determining acute life threatening conditions related to emergency care: CT renal stone study, which revealed punctate stone in the  distal left ureter with associated proximal moderate hydroureteronephrosis  Patient Reassessment and Ultimate Disposition/Management Patient states she felt that pain was minimally improved after treatment.  Did reach out to urology given concerns for infection on UA with evidence of obstruction secondary to left stone in the distal ureter.  Spoke to Dr. Pete Glatter who advised that at this time there is nothing emergent to do an UA findings could be related more to her recent stent placed in the right ureter given that she does not have bacteriuria at this time.  Advised that if pain is not well-controlled to admit to hospital and that urology could reevaluate her in the morning.  Admitted to hospital service with Dr. Randol Kern.  Did start her on Levaquin.  Patient management required discussion with the following services or consulting groups:  Hospitalist Service and Urology  Complexity of Problems Addressed Acute complicated illness or Injury  Additional Data Reviewed and Analyzed Further history obtained from: Past medical history and medications listed in the EMR, Prior ED visit notes, and Recent discharge summary  Patient Encounter Risk  Assessment Consideration of hospitalization         Final Clinical Impression(s) / ED Diagnoses Final diagnoses:  Ureterolithiasis    Rx / DC Orders ED Discharge Orders     None         Vaughan Browner 10/17/22 2013    Jacalyn Lefevre, MD 10/17/22 2137

## 2022-10-18 ENCOUNTER — Telehealth: Payer: Self-pay | Admitting: Family Medicine

## 2022-10-18 DIAGNOSIS — N390 Urinary tract infection, site not specified: Secondary | ICD-10-CM | POA: Diagnosis not present

## 2022-10-18 DIAGNOSIS — N201 Calculus of ureter: Secondary | ICD-10-CM

## 2022-10-18 DIAGNOSIS — N1330 Unspecified hydronephrosis: Secondary | ICD-10-CM | POA: Diagnosis not present

## 2022-10-18 DIAGNOSIS — B9689 Other specified bacterial agents as the cause of diseases classified elsewhere: Secondary | ICD-10-CM

## 2022-10-18 DIAGNOSIS — N183 Chronic kidney disease, stage 3 unspecified: Secondary | ICD-10-CM | POA: Diagnosis not present

## 2022-10-18 LAB — CBC
HCT: 32.9 % — ABNORMAL LOW (ref 36.0–46.0)
Hemoglobin: 10.2 g/dL — ABNORMAL LOW (ref 12.0–15.0)
MCH: 29.5 pg (ref 26.0–34.0)
MCHC: 31 g/dL (ref 30.0–36.0)
MCV: 95.1 fL (ref 80.0–100.0)
Platelets: 250 10*3/uL (ref 150–400)
RBC: 3.46 MIL/uL — ABNORMAL LOW (ref 3.87–5.11)
RDW: 18.3 % — ABNORMAL HIGH (ref 11.5–15.5)
WBC: 10.2 10*3/uL (ref 4.0–10.5)
nRBC: 0 % (ref 0.0–0.2)

## 2022-10-18 LAB — BASIC METABOLIC PANEL
Anion gap: 7 (ref 5–15)
BUN: 16 mg/dL (ref 8–23)
CO2: 24 mmol/L (ref 22–32)
Calcium: 9.6 mg/dL (ref 8.9–10.3)
Chloride: 105 mmol/L (ref 98–111)
Creatinine, Ser: 1.59 mg/dL — ABNORMAL HIGH (ref 0.44–1.00)
GFR, Estimated: 33 mL/min — ABNORMAL LOW (ref >60)
Glucose, Bld: 94 mg/dL (ref 70–99)
Potassium: 4.2 mmol/L (ref 3.5–5.1)
Sodium: 136 mmol/L (ref 135–145)

## 2022-10-18 LAB — URINE CULTURE: Culture: NO GROWTH

## 2022-10-18 LAB — GLUCOSE, CAPILLARY
Glucose-Capillary: 86 mg/dL (ref 70–99)
Glucose-Capillary: 85 mg/dL (ref 70–99)
Glucose-Capillary: 85 mg/dL (ref 70–99)
Glucose-Capillary: 129 mg/dL — ABNORMAL HIGH (ref 70–99)

## 2022-10-18 MED ORDER — TRAMADOL HCL 50 MG PO TABS
100.0000 mg | ORAL_TABLET | Freq: Once | ORAL | Status: AC
  Administered 2022-10-18: 100 mg via ORAL
  Filled 2022-10-18: qty 2

## 2022-10-18 NOTE — Progress Notes (Addendum)
PROGRESS NOTE   Cheryl Abbott  ZOX:[id number] DOB: 1946/02/06 DOA: 10/17/2022 PCP: Kerri Perches, MD   Chief Complaint  Patient presents with   Flank Pain   Level of care: Telemetry  Brief Admission History:   77 y.o. female, with a past medical history of stage III CKD, chronic shoulder pain, chronic back pain, osteoarthritis, anxiety disorder, hypertension, hyperlipidemia, type 2 diabetes, nephrolithiasis and UTIs with recent hospitalization due to complicated UTI from right hydronephrosis, status post double-J stent placement. -Patient presents to ED today secondary to flank pain, she reports recent right stent placement, with no complaints, but reports this morning when she woke up from sleep, she has been complaining of left flank pain, radiating to left abdomen and groin, as well she does report hematuria, she denies nausea, vomiting and diarrhea, no fever or chills -In ED her workup was significant for hematuria, pyuria, as well CT renal protocol has been obtained which was significant for new left ureteral hydronephrosis with kidney stone, so she was started on IV antibiotics, and [facility] hospitalist consulted to admit.  Assessment and Plan:  Complicated UTI with new left ureteral hydronephrosis -With recent hospitalization for complicated UTI with right hydronephrosis, status post JJ stent placement. -Appears to be with new left-sided hydronephrosis -Follow urine culture -continue IV ceftriaxone -Urology consulted Dr. Annabell Howells to see 5/9 -outpatient plan for stent placement 5/16 already arranged prior to arrival    CKD stage IIIb Renal function at baseline, continue to monitor, avoid nephrotoxic medications, continue gentle hydration   Diabetes mellitus type 2 with nephropathy -On insulin sliding scale during hospital stay, will hold Trulicity and Tradjenta  CBG (last 3)  Recent Labs    10/17/22 2121 10/18/22 0749 10/18/22 1128  GLUCAP 101* 86 85    Hyperlipidemia -Continue with statin   Hypertension -Continue with Norvasc and Imdur, will add as needed hydralazine   Anxiety and depression -Continue with Paxil    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 Annabell Howells  Procedures:   Antimicrobials:  Ceftriaxone IV 1 gm daily    Subjective: Pt reports that she has improvement in pain control.   Objective: Vitals:   10/18/22 0402 10/18/22 0409 10/18/22 0833 10/18/22 1516  BP: 105/65   130/68  Pulse: (!) 102   92  Resp:    19  Temp: 98.8 F (37.1 C)   98.6 F (37 C)  TempSrc:    Oral  SpO2: (!) 88% 93% 90% 94%  Weight:      Height:        Intake/Output Summary (Last 24 hours) at 10/18/2022 1527 Last data filed at 10/18/2022 0900 Gross per 24 hour  Intake 1474.63 ml  Output 1650 ml  Net -175.37 ml   Filed Weights   10/17/22 2100  Weight: 82.7 kg   Examination:  General exam: Appears calm and comfortable  Respiratory system: Clear to auscultation. Respiratory effort normal. Cardiovascular system: normal S1 & S2 heard. No JVD, murmurs, rubs, gallops or clicks. No pedal edema. Gastrointestinal system: Abdomen is nondistended, soft and nontender. No organomegaly or masses felt. Normal bowel sounds heard. Central nervous system:  Alert and oriented. No focal neurological deficits. Extremities: Symmetric 5 x 5 power. Skin: No rashes, lesions or ulcers. Psychiatry: Judgement and insight appear normal. Mood & affect appropriate.   Data Reviewed: I have personally reviewed following labs and imaging studies  CBC: Recent Labs  Lab  10/17/22 1334 10/18/22 0505  WBC 12.5* 10.2  NEUTROABS 9.3*  --   HGB 11.1* 10.2*  HCT 35.1* 32.9*  MCV 94.9 95.1  PLT 263 250    Basic Metabolic Panel: Recent Labs  Lab 10/17/22 1334 10/18/22 0505  NA 132* 136  K 3.6 4.2  CL 101 105  CO2 22 24  GLUCOSE 123* 94  BUN 18 16  CREATININE 1.60* 1.59*  CALCIUM 9.4 9.6    CBG: Recent Labs  Lab 10/17/22 1804 10/17/22 2121 10/18/22 0749 10/18/22 1128  GLUCAP 80 101* 86 85    Recent Results (from the past 240 hour(s))  Urine Culture     Status: Abnormal   Collection Time: 10/12/22 11:02 AM   Specimen: Urine   UR  Result Value Ref Range Status   Urine Culture, Routine Final report (A)  Final   Organism ID, Bacteria Enterococcus faecalis (A)  Final    Comment: For Enterococcus species, aminoglycosides (except for high-level resistance screening), cephalosporins, clindamycin, and trimethoprim-sulfamethoxazole are not effective clinically. (CLSI, M100-S26, 2016) Greater than 100,000 colony forming units per mL    Antimicrobial Susceptibility Comment  Final    Comment:       ** S = Susceptible; I = Intermediate; R = Resistant **                    P = Positive; N = Negative             MICS are expressed in micrograms per mL    Antibiotic                 RSLT#1    RSLT#2    RSLT#3    RSLT#4 Ciprofloxacin                  S Levofloxacin                   S Nitrofurantoin                 S Penicillin                     S Tetracycline                   R Vancomycin                     S      Radiology Studies: CT RENAL STONE STUDY  Result Date: 10/17/2022 CLINICAL DATA:  Left lower quadrant abdominal/flank pain. Recent urinary tract infection. EXAM: CT ABDOMEN AND PELVIS WITHOUT CONTRAST TECHNIQUE: Multidetector CT imaging of the abdomen and pelvis was performed following the standard protocol without IV contrast. RADIATION DOSE REDUCTION: This exam was performed according to the departmental dose-optimization program  which includes automated exposure control, adjustment of the mA and/or kV according to patient size and/or use of iterative reconstruction technique. COMPARISON:  CT 09/23/2022, 02/13/2022 FINDINGS: Lower chest: Bandlike subsegmental atelectasis in the right middle and lower lobe related to elevated hemidiaphragm. 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 No acute airspace disease. Hepatobiliary: Unremarkable unenhanced appearance of the liver. Gallbladder physiologically distended, no calcified stone. No  biliary dilatation. Pancreas: No ductal dilatation or inflammation. Spleen: Normal in size without focal abnormality. Adrenals/Urinary Tract: No adrenal nodule. Placement of right nephroureteral stent since prior exam. Proximal pigtails coiled in the dilated renal pelvis, the distal pigtail is coiled in the bladder. 3 mm stone fragment adjacent to the distal stent series 2, image 72. There is diminished right hydronephrosis from prior exam with persistent dilatation of the renal pelvis and ureter. Mild background thinning of right renal parenchyma. There is no evidence of renal fluid collection. Hypodense right renal lesion on prior exam is not well seen in the absence of IV contrast. Progressive left hydroureteronephrosis from prior. There is a punctate stone in the distal left ureter, series 5, image 77 causing proximal obstruction. Multiple additional punctate nonobstructing left intrarenal calculi. There is mild thinning of the left renal parenchyma. Stomach/Bowel: Unremarkable appearance of the stomach. Small duodenal diverticulum. There is no bowel obstruction or inflammation. Normal appendix. Left colonic diverticulosis, most prominent in the sigmoid. No diverticulitis. Vascular/Lymphatic: Mild aortic atherosclerosis. No aneurysm. No enlarged lymph nodes in the abdomen or pelvis. Reproductive: Status post hysterectomy. No adnexal masses. Other: No free air or free fluid. Scattered subcutaneous  densities in the lower anterior abdominal wall are typical of medication injection sites. Tiny fat containing umbilical hernia. There is minimal fat in both inguinal canals. Musculoskeletal: Lumbar facet hypertrophy. There are no acute or suspicious osseous abnormalities. Left gluteal subcutaneous granulomas. IMPRESSION: 1. Punctate distal left ureteric stone causing moderate proximal hydroureteronephrosis. Increased hydronephrosis from prior exam. Multiple additional punctate nonobstructing left intrarenal calculi. 2. Placement of right nephroureteral stent since prior exam. Diminished right hydronephrosis with persistent renal pelvis dilatation. 3 mm stone is seen adjacent to the distal portion of the stent. 3. Colonic diverticulosis without diverticulitis. Aortic Atherosclerosis (ICD10-I70.0). Electronically Signed   By: Narda Rutherford M.D.   On: 10/17/2022 15:06    Scheduled Meds:  amLODipine  10 mg Oral Daily   aspirin EC  81 mg Oral Daily   folic acid  1 mg Oral Daily   heparin  5,000 Units Subcutaneous Q8H   insulin aspart  0-15 Units Subcutaneous TID WC   insulin aspart  0-5 Units Subcutaneous QHS   isosorbide mononitrate  15 mg Oral Daily   mometasone-formoterol  2 puff Inhalation BID   pantoprazole  40 mg Oral BID   PARoxetine  40 mg Oral q AM   rosuvastatin  40 mg Oral Daily   cyanocobalamin  500 mcg Oral Daily   Continuous Infusions:  sodium chloride 50 mL/hr at 10/18/22 0719   cefTRIAXone (ROCEPHIN)  IV 1 g (10/17/22 2242)     LOS: 1 day   Time spent: 38 mins  Alara Daniel Laural Benes, MD How to contact the [facility] Attending or Consulting provider 7A - 7P or covering provider during after hours 7P -7A, for this patient?  Check the care team in [facility] and look for a) attending/consulting [facility] provider listed and b) the [place] team listed Log into [email or web address] and use [place]'s universal password to access. If you do not have the password, please contact the hospital operator. Locate the  [facility] provider you are looking for under [facility] and page to a number that you can be directly reached. If you still have difficulty reaching the provider, please page the [facility] (Director on Call) for the Hospitalists listed on amion for assistance.  10/18/2022, 3:27 PM

## 2022-10-18 NOTE — H&P (View-Only) (Signed)
Subjective: 1. Ureterolithiasis      Consult requested by Dr. Clanford Johnson.   Cheryl Abbott is a 77 yo female with a history of recurrent urolithaisis who had placement of a right ureteral stent for a 3mm right distal stone with possible infection on 09/24/22.  She is scheduled for ureteroscopy on 10/25/22.  She was seen by Dr. Simpson on 5/2 for dark urine and painful urination and a culture grew enterococcus.  She was started on Cipro on 10/17/22.  She was seen in the ER yesterday with left flank pain and a CT showed a punctate left distal stone with hydro and the right stent in good position with reduced dilation.  She has had improvement in her pain with medication.  She has no nausea.  She denies fever or chills.  She had a WBC count of 12.5 on admission but that has normalized.  Her urine has pyuria and hematuria but she has a stent.  Her Cr is up from baseline of about 1.3 to 1.6.    ROS:  Review of Systems  Constitutional:  Negative for chills and fever.  Gastrointestinal:  Positive for abdominal pain. Negative for nausea and vomiting.  Genitourinary:  Positive for flank pain.    Allergies  Allergen Reactions   Ace Inhibitors Cough    Patient doesn't recall  Other Reaction(s): GI Intolerance   Cephalexin Diarrhea and Nausea And Vomiting    Other Reaction(s): GI Intolerance   Nitrofurantoin Diarrhea and Nausea And Vomiting   Penicillins Hives and Other (See Comments)    Past Medical History:  Diagnosis Date   Anxiety disorder    Arthritis    Chronic back pain    Chronic pain of both shoulders    per pt told due to cervical spine pinched nerve   CKD (chronic kidney disease), stage III (HCC)    nephrologist--- dr bhutani;   due to tubular necrosis   Claustrophobia    Depression    Edema of both lower extremities    GERD (gastroesophageal reflux disease)    Headache    Hemorrhoids    w/ intermittant bleeding   History of adenomatous polyp of colon    History of kidney  stones    Hx-TIA (transient ischemic attack) 02/26/2016   per pt no residual   Hydronephrosis, left    followed by urologist--- dr dahlstedt;   persistant   Hyperlipidemia    Hypertension    Iron deficiency    followed by [facility] w/ hx iron infusions   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   oncologist--- [facility];  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, active survillence   Lactose intolerance in adult 02/01/2016   Left ureteral calculus    Lumbar disc disease with radiculopathy    Nephrolithiasis    per CT 08-11-2021  left > right   Neuropathy, peripheral    Nocturia more than twice per night    Normocytic anemia    OSA (obstructive sleep apnea)    per study 2007;   per pt no Bipap use since 2021   Renal atrophy, bilateral    Renal cyst, right    Sciatica    Sigmoid diverticulosis    Type 2 diabetes mellitus (HCC)     Past Surgical History:  Procedure Laterality Date   BIOPSY N/A 03/17/2013   Procedure: GASTRIC BIOPSIES;  Surgeon: Sandi L Fields, MD;  Location: [facility];  Service: Endoscopy;  Laterality: N/A;   BIOPSY    06/10/2015   Procedure: BIOPSY;  Surgeon: Sandi L Fields, MD;  [facility];  Service: Endoscopy;;  gastric biopsy   CARDIAC CATHETERIZATION  05/11/2003   @ [facility] by dr t. kelly;  Abnormal cardiolite/   Normal coronary arteries and normal LVF,  ef  63%   CARDIOVASCULAR STRESS TEST  01/01/2014   normal lexiscan cardiolite/  no ischemia/ infarct/  normal LV function and wall motion ,  ef 81%   COLONOSCOPY WITH PROPOFOL N/A 02/26/2017   Procedure: COLONOSCOPY WITH PROPOFOL;  Surgeon: Fields, Sandi L, MD;  [facility];  Service: Endoscopy;  Laterality: N/A;  11:00am   COLONOSCOPY WITH PROPOFOL N/A 09/22/2019   Procedure: COLONOSCOPY WITH PROPOFOL;  Surgeon: Fields, Sandi L, MD;  [facility];  Service: Endoscopy;  Laterality: N/A;  1:15   CYSTO/   RIGHT URETEROSOCOPY LASER  LITHOTRIPSY STONE EXTRACTION  10/13/2004   CYSTO/  RIGHT RETROGRADE PYELOGRAM/  PLACMENT RIGHT URETERAL STENT  01/10/2010   CYSTOSCOPY W/ URETERAL STENT PLACEMENT Right 08/19/2015   Procedure: CYSTOSCOPY WITH RETROGRADE PYELOGRAM/URETERAL STENT PLACEMENT;  Surgeon: Stephen Dahlstedt, MD;  Location: [facility];  Service: Urology;  Laterality: Right;   CYSTOSCOPY W/ URETERAL STENT PLACEMENT Left 02/23/2020   Procedure: CYSTOSCOPY LEFT  RETROGRADE PYELOGRAM LEFT URETEROSCOPY URETERAL STENT PLACEMENT;  Surgeon: Dahlstedt, Stephen, MD;  Location: [facility];  Service: Urology;  Laterality: Left;   CYSTOSCOPY W/ URETERAL STENT PLACEMENT Right 09/24/2022   Procedure: CYSTOSCOPY WITH RETROGRADE PYELOGRAM/URETERAL STENT PLACEMENT;  Surgeon: McKenzie, Patrick L, MD;  Location: [facility];  Service: Urology;  Laterality: Right;   CYSTOSCOPY WITH RETROGRADE PYELOGRAM, URETEROSCOPY AND STENT PLACEMENT Left 10/21/2014   Procedure: CYSTOSCOPY WITH RETROGRADE PYELOGRAM, URETEROSCOPY AND STENT PLACEMENT;  Surgeon: Stephen Dahlstedt, MD;  Location: [facility];  Service: Urology;  Laterality: Left;   CYSTOSCOPY WITH RETROGRADE PYELOGRAM, URETEROSCOPY AND STENT PLACEMENT Right 11/22/2015   Procedure: CYSTOSCOPY, RIGHT URETERAL STENT REMOVAL; RIGHT RETROGRADE PYELOGRAM, RIGHT URETEROSCOPY WITH BALLOON DILATION; RIGHT URETERAL STENT PLACEMENT;  Surgeon: Stephen Dahlstedt, MD;  [facility]: [facility];  Service: Urology;  Laterality: Right;   CYSTOSCOPY WITH RETROGRADE PYELOGRAM, URETEROSCOPY AND STENT PLACEMENT Left 10/18/2020   Procedure: CYSTOSCOPY WITH LEFT RETROGRADE PYELOGRAM, LEFT URETEROSCOPY  WITH LASER AND LEFT URETERAL STENT PLACEMENT;  Surgeon: Dahlstedt, Stephen, MD;  [facility];  Service: Urology;  Laterality: Left;   CYSTOSCOPY WITH RETROGRADE PYELOGRAM, URETEROSCOPY AND STENT PLACEMENT Left 10/26/2021   Procedure: CYSTOSCOPY WITH RETROGRADE PYELOGRAM, URETEROSCOPY, STONE EXTRACTION  AND STENT PLACEMENT;   Surgeon: Dahlstedt, Stephen, MD;  Location: [facility];  Service: Urology;  Laterality: Left;   CYSTOSCOPY/RETROGRADE/URETEROSCOPY/STONE EXTRACTION WITH BASKET Bilateral 08/02/2015   Procedure: CYSTOSCOPY; BILATERAL RETROGRADE PYELOGRAMS; BILATERAL URETEROSCOPY, STONE EXTRACTION WITH BASKET;  Surgeon: Stephen Dahlstedt, MD;  [facility]: [facility];  Service: Urology;  Laterality: Bilateral;   CYSTOSCOPY/RETROGRADE/URETEROSCOPY/STONE EXTRACTION WITH BASKET Right 06/28/2015   Procedure: CYSTOSCOPY/RETROGRADE/URETEROSCOPY/STONE EXTRACTION WITH BASKET, RIGHT URETERAL DOUBLE J STENT PLACEMENT;  Surgeon: Stephen Dahlstedt, MD;  [facility]: [facility];  Service: Urology;  Laterality: Right;   ESOPHAGOGASTRODUODENOSCOPY (EGD) WITH PROPOFOL N/A 03/17/2013   Procedure: ESOPHAGOGASTRODUODENOSCOPY (EGD) WITH PROPOFOL;  Surgeon: Sandi L Fields, MD;  [facility]: [facility];  Service: Endoscopy;  Laterality: N/A;   ESOPHAGOGASTRODUODENOSCOPY (EGD) WITH PROPOFOL N/A 06/10/2015   Distal gastritis, distal esophageal stricture s/p dilation   ESOPHAGOGASTRODUODENOSCOPY (EGD) WITH PROPOFOL N/A 02/26/2017   Procedure: ESOPHAGOGASTRODUODENOSCOPY (EGD) WITH PROPOFOL;  Surgeon: Fields, Sandi L, MD;  [facility];    Service: Endoscopy;  Laterality: N/A;   FLEXIBLE SIGMOIDOSCOPY  09/11/2011   SLF:MODERATE Internal hemorrhoids   HOLMIUM LASER APPLICATION Left 10/21/2014   Procedure: HOLMIUM LASER APPLICATION;  Surgeon: Stephen Dahlstedt, MD;  Location: [facility];  Service: Urology;  Laterality: Left;   HOLMIUM LASER APPLICATION Right 06/28/2015   Procedure: HOLMIUM LASER APPLICATION;  Surgeon: Stephen Dahlstedt, MD;  [facility];  Service: Urology;  Laterality: Right;   HOLMIUM LASER APPLICATION  02/23/2020   Procedure: HOLMIUM LASER APPLICATION;  Surgeon: Dahlstedt, Stephen, MD;  Location: [facility];  Service: Urology;;   HOLMIUM LASER APPLICATION Left 10/18/2020   Procedure: HOLMIUM LASER  APPLICATION;  Surgeon: Dahlstedt, Stephen, MD;  Location: [facility];  Service: Urology;  Laterality: Left;   MASS EXCISION Left 03/04/2015   Procedure: EXCISION OF SOFT TISSUE NEOPLASM LEFT ARM;  Surgeon: Mark Jenkins, MD;  Location: [facility];  Service: General;  Laterality: Left;   PERCUTANEOUS NEPHROSTOLITHOTOMY Bilateral 05/2010   @[facility]   POLYPECTOMY N/A 03/17/2013   Procedure: GASTRIC POLYPECTOMY;  Surgeon: Sandi L Fields, MD;  [facility]: [facility];  Service: Endoscopy;  Laterality: N/A;   POLYPECTOMY  02/26/2017   Procedure: POLYPECTOMY;  Surgeon: Fields, Sandi L, MD;  [facility];  Service: Endoscopy;;  polyp at cecum, ascending colon polyps x3, hepatic flexure polyps x8, transverse colon polyps x8    POLYPECTOMY  09/22/2019   Procedure: POLYPECTOMY;  Surgeon: Fields, Sandi L, MD;  [facility];  Service: Endoscopy;;   REMOVAL RIGHT THIGH CYST  2006   SAVORY DILATION N/A 03/17/2013   Procedure: SAVORY DILATION;  Surgeon: Sandi L Fields, MD;  Location: [facility];  Service: Endoscopy;  Laterality: N/A;  #12.8, 14, 15, 16 dilators used   SAVORY DILATION N/A 06/10/2015   Procedure: SAVORY DILATION;  Surgeon: Sandi L Fields, MD;  [facility];  Service: Endoscopy;  Laterality: N/A;   VAGINAL HYSTERECTOMY  1970's    Social History   Socioeconomic History   Marital status: Divorced    Spouse name: Not on file   Number of children: 2   Years of education: 12   Highest education level: 12th grade  Occupational History   Occupation: retired   Tobacco Use   Smoking status: Former    Packs/day: 1.00    Years: 20.00    Additional pack years: 0.00    Total pack years: 20.00    Types: Cigarettes    Start date: 09/21/1974    Quit date: 09/20/1988    Years since quitting: 34.0    Passive exposure: Past   Smokeless tobacco: Never  Vaping Use   Vaping Use: Never used  Substance and Sexual Activity   Alcohol use: No    Alcohol/week: 0.0 standard drinks of alcohol    Drug use: No   Sexual activity: Not Currently    Partners: Male    Birth control/protection: Surgical  Other Topics Concern   Not on file  Social History Narrative   Patient is right handed   Patient drinks some caffeine daily.   Social Determinants of Health   Financial Resource Strain: Low Risk  (07/06/2022)   Overall Financial Resource Strain (CARDIA)    Difficulty of Paying Living Expenses: Not hard at all  Food Insecurity: No Food Insecurity (10/17/2022)   Hunger Vital Sign    Worried About Running Out of Food in the Last Year: Never true    Ran Out of Food in the Last Year: Never true    Transportation Needs: No Transportation Needs (10/17/2022)   PRAPARE - Transportation    Lack of Transportation (Medical): No    Lack of Transportation (Non-Medical): No  Physical Activity: Inactive (07/06/2022)   Exercise Vital Sign    Days of Exercise per Week: 0 days    Minutes of Exercise per Session: 0 min  Stress: No Stress Concern Present (07/06/2022)   [employer] - Occupational Stress Questionnaire    Feeling of Stress : Not at all  Social Connections: Moderately Isolated (07/06/2022)   Social Connection and Isolation Panel [NHANES]    Frequency of Communication with Friends and Family: More than three times a week    Frequency of Social Gatherings with Friends and Family: Once a week    Attends Religious Services: More than 4 times per year    Active Member of Clubs or Organizations: No    Attends Club or Organization Meetings: Never    Marital Status: Divorced  Intimate Partner Violence: Not At Risk (10/17/2022)   Humiliation, Afraid, Rape, and Kick questionnaire    Fear of Current or Ex-Partner: No    Emotionally Abused: No    Physically Abused: No    Sexually Abused: No    Family History  Problem Relation Age of Onset   Hypertension Mother    Diabetes Mother    Heart failure Mother    Dementia Mother    Emphysema Father    Hypertension Father     Diabetes Brother    GER disease Brother    Hypertension Sister    Hypertension Sister    Cancer Other        family history    Diabetes Other        family history    Heart defect Other        famiily history    Arthritis Other        family history    Anesthesia problems Neg Hx    Hypotension Neg Hx    Malignant hyperthermia Neg Hx    Pseudochol deficiency Neg Hx    Colon cancer Neg Hx     Anti-infectives: Anti-infectives (From admission, onward)    Start     Dose/Rate Route Frequency Ordered Stop   10/17/22 2200  cefTRIAXone (ROCEPHIN) 1 g in sodium chloride 0.9 % 100 mL IVPB        1 g 200 mL/hr over 30 Minutes Intravenous Every 24 hours 10/17/22 2017     10/17/22 1800  levofloxacin (LEVAQUIN) IVPB 500 mg        500 mg 100 mL/hr over 60 Minutes Intravenous  Once 10/17/22 1747 10/17/22 1906       Current Facility-Administered Medications  Medication Dose Route Frequency Provider Last Rate Last Admin   0.9 %  sodium chloride infusion   Intravenous Continuous Elgergawy, Dawood S, MD 50 mL/hr at 10/18/22 0719 Infusion Verify at 10/18/22 0719   acetaminophen (TYLENOL) tablet 650 mg  650 mg Oral Q6H PRN Elgergawy, Dawood S, MD       Or   acetaminophen (TYLENOL) suppository 650 mg  650 mg Rectal Q6H PRN Elgergawy, Dawood S, MD       amLODipine (NORVASC) tablet 10 mg  10 mg Oral Daily Elgergawy, Dawood S, MD   10 mg at 10/18/22 0901   aspirin EC tablet 81 mg  81 mg Oral Daily Elgergawy, Dawood S, MD   81 mg at 10/18/22 0901   cefTRIAXone (ROCEPHIN) 1 g in sodium chloride   0.9 % 100 mL IVPB  1 g Intravenous Q24H Elgergawy, Dawood S, MD 200 mL/hr at 10/17/22 2242 1 g at 10/17/22 2242   folic acid (FOLVITE) tablet 1 mg  1 mg Oral Daily Elgergawy, Dawood S, MD   1 mg at 10/18/22 0901   gabapentin (NEURONTIN) capsule 300 mg  300 mg Oral TID PRN Elgergawy, Dawood S, MD   300 mg at 10/17/22 2251   heparin injection 5,000 Units  5,000 Units Subcutaneous Q8H Elgergawy, Dawood S, MD        hydrALAZINE (APRESOLINE) injection 5 mg  5 mg Intravenous Q4H PRN Elgergawy, Dawood S, MD       insulin aspart (novoLOG) injection 0-15 Units  0-15 Units Subcutaneous TID WC Elgergawy, Dawood S, MD       insulin aspart (novoLOG) injection 0-5 Units  0-5 Units Subcutaneous QHS Elgergawy, Dawood S, MD       isosorbide mononitrate (IMDUR) 24 hr tablet 15 mg  15 mg Oral Daily Elgergawy, Dawood S, MD       mometasone-formoterol (DULERA) 100-5 MCG/ACT inhaler 2 puff  2 puff Inhalation BID Elgergawy, Dawood S, MD   2 puff at 10/18/22 0830   morphine (PF) 2 MG/ML injection 2 mg  2 mg Intravenous Q4H PRN Elgergawy, Dawood S, MD       nitroGLYCERIN (NITROSTAT) SL tablet 0.4 mg  0.4 mg Sublingual Q5 min PRN Elgergawy, Dawood S, MD       oxyCODONE (Oxy IR/ROXICODONE) immediate release tablet 5 mg  5 mg Oral Q4H PRN Elgergawy, Dawood S, MD   5 mg at 10/18/22 0409   pantoprazole (PROTONIX) EC tablet 40 mg  40 mg Oral BID Elgergawy, Dawood S, MD   40 mg at 10/18/22 0901   PARoxetine (PAXIL) tablet 40 mg  40 mg Oral q AM Elgergawy, Dawood S, MD   40 mg at 10/18/22 0657   rosuvastatin (CRESTOR) tablet 40 mg  40 mg Oral Daily Elgergawy, Dawood S, MD   40 mg at 10/18/22 0900   vitamin B-12 (CYANOCOBALAMIN) tablet 500 mcg  500 mcg Oral Daily Elgergawy, Dawood S, MD   500 mcg at 10/18/22 0901     Objective: Vital signs in last 24 hours: BP 105/65 (BP Location: Right Arm)   Pulse (!) 102   Temp 98.8 F (37.1 C)   Resp 20   Ht 5' 6" (1.676 m)   Wt 82.7 kg   SpO2 90%   BMI 29.44 kg/m   Intake/Output from previous day: 05/08 0701 - 05/09 0700 In: 1080.5 [P.O.:240; I.V.:240.5; IV Piggyback:600] Out: 1300 [Urine:1300] Intake/Output this shift: Total I/O In: 394.1 [P.O.:240; I.V.:154.1] Out: 350 [Urine:350]   Physical Exam Vitals reviewed.  Constitutional:      Appearance: Normal appearance.  Abdominal:     Palpations: Abdomen is soft.     Tenderness: There is no abdominal tenderness. There is  no right CVA tenderness or left CVA tenderness.  Neurological:     Mental Status: She is alert.    Lab Results:  Results for orders placed or performed during the hospital encounter of 10/17/22 (from the past 24 hour(s))  CBC with Differential     Status: Abnormal   Collection Time: 10/17/22  1:34 PM  Result Value Ref Range   WBC 12.5 (H) 4.0 - 10.5 K/uL   RBC 3.70 (L) 3.87 - 5.11 MIL/uL   Hemoglobin 11.1 (L) 12.0 - 15.0 g/dL   HCT 35.1 (L) 36.0 - 46.0 %     MCV 94.9 80.0 - 100.0 fL   MCH 30.0 26.0 - 34.0 pg   MCHC 31.6 30.0 - 36.0 g/dL   RDW 18.1 (H) 11.5 - 15.5 %   Platelets 263 150 - 400 K/uL   nRBC 0.0 0.0 - 0.2 %   Neutrophils Relative % 74 %   Neutro Abs 9.3 (H) 1.7 - 7.7 K/uL   Lymphocytes Relative 11 %   Lymphs Abs 1.3 0.7 - 4.0 K/uL   Monocytes Relative 10 %   Monocytes Absolute 1.2 (H) 0.1 - 1.0 K/uL   Eosinophils Relative 5 %   Eosinophils Absolute 0.6 (H) 0.0 - 0.5 K/uL   Basophils Relative 0 %   Basophils Absolute 0.0 0.0 - 0.1 K/uL   Immature Granulocytes 0 %   Abs Immature Granulocytes 0.05 0.00 - 0.07 K/uL  Comprehensive metabolic panel     Status: Abnormal   Collection Time: 10/17/22  1:34 PM  Result Value Ref Range   Sodium 132 (L) 135 - 145 mmol/L   Potassium 3.6 3.5 - 5.1 mmol/L   Chloride 101 98 - 111 mmol/L   CO2 22 22 - 32 mmol/L   Glucose, Bld 123 (H) 70 - 99 mg/dL   BUN 18 8 - 23 mg/dL   Creatinine, Ser 1.60 (H) 0.44 - 1.00 mg/dL   Calcium 9.4 8.9 - 10.3 mg/dL   Total Protein 8.4 (H) 6.5 - 8.1 g/dL   Albumin 3.6 3.5 - 5.0 g/dL   AST 15 15 - 41 U/L   ALT 14 0 - 44 U/L   Alkaline Phosphatase 77 38 - 126 U/L   Total Bilirubin 0.3 0.3 - 1.2 mg/dL   GFR, Estimated 33 (L) >60 mL/min   Anion gap 9 5 - 15  Lipase, blood     Status: None   Collection Time: 10/17/22  1:34 PM  Result Value Ref Range   Lipase 39 11 - 51 U/L  Urinalysis, Routine w reflex microscopic -Urine, Catheterized     Status: Abnormal   Collection Time: 10/17/22  4:58 PM   Result Value Ref Range   Color, Urine YELLOW YELLOW   APPearance CLOUDY (A) CLEAR   Specific Gravity, Urine 1.012 1.005 - 1.030   pH 7.0 5.0 - 8.0   Glucose, UA NEGATIVE NEGATIVE mg/dL   Hgb urine dipstick LARGE (A) NEGATIVE   Bilirubin Urine NEGATIVE NEGATIVE   Ketones, ur NEGATIVE NEGATIVE mg/dL   Protein, ur 100 (A) NEGATIVE mg/dL   Nitrite NEGATIVE NEGATIVE   Leukocytes,Ua LARGE (A) NEGATIVE   RBC / HPF >50 0 - 5 RBC/hpf   WBC, UA >50 0 - 5 WBC/hpf   Bacteria, UA NONE SEEN NONE SEEN   Squamous Epithelial / HPF 0-5 0 - 5 /HPF   WBC Clumps PRESENT   CBG monitoring, ED     Status: None   Collection Time: 10/17/22  6:04 PM  Result Value Ref Range   Glucose-Capillary 80 70 - 99 mg/dL   Comment 1 Notify RN    Comment 2 Document in Chart   Glucose, capillary     Status: Abnormal   Collection Time: 10/17/22  9:21 PM  Result Value Ref Range   Glucose-Capillary 101 (H) 70 - 99 mg/dL   Comment 1 Notify RN    Comment 2 Document in Chart   Basic metabolic panel     Status: Abnormal   Collection Time: 10/18/22  5:05 AM  Result Value Ref Range   Sodium 136 135 -   145 mmol/L   Potassium 4.2 3.5 - 5.1 mmol/L   Chloride 105 98 - 111 mmol/L   CO2 24 22 - 32 mmol/L   Glucose, Bld 94 70 - 99 mg/dL   BUN 16 8 - 23 mg/dL   Creatinine, Ser 1.59 (H) 0.44 - 1.00 mg/dL   Calcium 9.6 8.9 - 10.3 mg/dL   GFR, Estimated 33 (L) >60 mL/min   Anion gap 7 5 - 15  CBC     Status: Abnormal   Collection Time: 10/18/22  5:05 AM  Result Value Ref Range   WBC 10.2 4.0 - 10.5 K/uL   RBC 3.46 (L) 3.87 - 5.11 MIL/uL   Hemoglobin 10.2 (L) 12.0 - 15.0 g/dL   HCT 32.9 (L) 36.0 - 46.0 %   MCV 95.1 80.0 - 100.0 fL   MCH 29.5 26.0 - 34.0 pg   MCHC 31.0 30.0 - 36.0 g/dL   RDW 18.3 (H) 11.5 - 15.5 %   Platelets 250 150 - 400 K/uL   nRBC 0.0 0.0 - 0.2 %  Glucose, capillary     Status: None   Collection Time: 10/18/22  7:49 AM  Result Value Ref Range   Glucose-Capillary 86 70 - 99 mg/dL  Glucose, capillary      Status: None   Collection Time: 10/18/22 11:28 AM  Result Value Ref Range   Glucose-Capillary 85 70 - 99 mg/dL   *Note: Due to a large number of results and/or encounters for the requested time period, some results have not been displayed. A complete set of results can be found in Results Review.    BMET Recent Labs    10/17/22 1334 10/18/22 0505  NA 132* 136  K 3.6 4.2  CL 101 105  CO2 22 24  GLUCOSE 123* 94  BUN 18 16  CREATININE 1.60* 1.59*  CALCIUM 9.4 9.6   PT/INR No results for input(s): "LABPROT", "INR" in the last 72 hours. ABG No results for input(s): "PHART", "HCO3" in the last 72 hours.  Invalid input(s): "PCO2", "PO2"  Studies/Results: CT RENAL STONE STUDY  Result Date: 10/17/2022 CLINICAL DATA:  Left lower quadrant abdominal/flank pain. Recent urinary tract infection. EXAM: CT ABDOMEN AND PELVIS WITHOUT CONTRAST TECHNIQUE: Multidetector CT imaging of the abdomen and pelvis was performed following the standard protocol without IV contrast. RADIATION DOSE REDUCTION: This exam was performed according to the departmental dose-optimization program which includes automated exposure control, adjustment of the mA and/or kV according to patient size and/or use of iterative reconstruction technique. COMPARISON:  CT 09/23/2022, 02/13/2022 FINDINGS: Lower chest: Bandlike subsegmental atelectasis in the right middle and lower lobe related to elevated hemidiaphragm. 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 No acute airspace disease. Hepatobiliary: Unremarkable unenhanced appearance of the liver. Gallbladder physiologically distended, no calcified stone. No biliary dilatation. Pancreas: No ductal dilatation or inflammation. Spleen: Normal in size without focal abnormality. Adrenals/Urinary Tract: No adrenal nodule. Placement of right nephroureteral stent since prior exam. Proximal pigtails coiled in the dilated renal pelvis, the distal pigtail is coiled in the bladder. 3 mm  stone fragment adjacent to the distal stent series 2, image 72. There is diminished right hydronephrosis from prior exam with persistent dilatation of the renal pelvis and ureter. Mild background thinning of right renal parenchyma. There is no evidence of renal fluid collection. Hypodense right renal lesion on prior exam is not well seen in the absence of IV contrast. Progressive left hydroureteronephrosis from prior. There is a punctate stone in the   distal left ureter, series 5, image 77 causing proximal obstruction. Multiple additional punctate nonobstructing left intrarenal calculi. There is mild thinning of the left renal parenchyma. Stomach/Bowel: Unremarkable appearance of the stomach. Small duodenal diverticulum. There is no bowel obstruction or inflammation. Normal appendix. Left colonic diverticulosis, most prominent in the sigmoid. No diverticulitis. Vascular/Lymphatic: Mild aortic atherosclerosis. No aneurysm. No enlarged lymph nodes in the abdomen or pelvis. Reproductive: Status post hysterectomy. No adnexal masses. Other: No free air or free fluid. Scattered subcutaneous densities in the lower anterior abdominal wall are typical of medication injection sites. Tiny fat containing umbilical hernia. There is minimal fat in both inguinal canals. Musculoskeletal: Lumbar facet hypertrophy. There are no acute or suspicious osseous abnormalities. Left gluteal subcutaneous granulomas. IMPRESSION: 1. Punctate distal left ureteric stone causing moderate proximal hydroureteronephrosis. Increased hydronephrosis from prior exam. Multiple additional punctate nonobstructing left intrarenal calculi. 2. Placement of right nephroureteral stent since prior exam. Diminished right hydronephrosis with persistent renal pelvis dilatation. 3 mm stone is seen adjacent to the distal portion of the stent. 3. Colonic diverticulosis without diverticulitis. Aortic Atherosclerosis (ICD10-I70.0). Electronically Signed   By: Melanie   Sanford M.D.   On: 10/17/2022 15:06     Assessment/Plan: Punctate left ureteral stone with obstruction.  Her pain is controlled at this time.  She is scheduled for cystoscopy with right ureteroscopy for 10/25/22 and it would be reasonable to do a left retrograde pyelogram and ureteroscopy for the left ureteral stone at that time if her pain remains controlled and she doesn't have signs of sepsis.  Right ureteral stone with stent.  She will continue with planned procedure next week.  UTI.  She had enterococcus on her culture on 5/3 and is on Cipro since yesterday. She remains afebrile and should continue that pending upcoming procedure.   CKD with acute exacerbation.  Her Cr is up slightly from baseline but not enough to merit acute intervention with planned procedure next week.       She is scheduled for cystoscopy with ureteroscopy on 5/16.   CC: Dr. Clanford Johnson.      Phoenicia Pirie 10/18/2022   

## 2022-10-18 NOTE — Telephone Encounter (Signed)
FMLA  Copied Noted Sleeved  Call patient daughter when completed [phone] and fax to [phone].  Patient daughter lives in [place].

## 2022-10-18 NOTE — Consult Note (Signed)
Subjective: 1. Ureterolithiasis      Consult requested by Dr. Standley Dakins.   Cheryl Abbott is a 77 yo female with a history of recurrent urolithaisis who had placement of a right ureteral stent for a 3mm right distal stone with possible infection on 09/24/22.  She is scheduled for ureteroscopy on 10/25/22.  She was seen by Dr. Lodema Hong on 5/2 for dark urine and painful urination and a culture grew enterococcus.  She was started on Cipro on 10/17/22.  She was seen in the ER yesterday with left flank pain and a CT showed a punctate left distal stone with hydro and the right stent in good position with reduced dilation.  She has had improvement in her pain with medication.  She has no nausea.  She denies fever or chills.  She had a WBC count of 12.5 on admission but that has normalized.  Her urine has pyuria and hematuria but she has a stent.  Her Cr is up from baseline of about 1.3 to 1.6.    ROS:  Review of Systems  Constitutional:  Negative for chills and fever.  Gastrointestinal:  Positive for abdominal pain. Negative for nausea and vomiting.  Genitourinary:  Positive for flank pain.    Allergies  Allergen Reactions   Ace Inhibitors Cough    Patient doesn't recall  Other Reaction(s): GI Intolerance   Cephalexin Diarrhea and Nausea And Vomiting    Other Reaction(s): GI Intolerance   Nitrofurantoin Diarrhea and Nausea And Vomiting   Penicillins Hives and Other (See Comments)    Past Medical History:  Diagnosis Date   Anxiety disorder    Arthritis    Chronic back pain    Chronic pain of both shoulders    per pt told due to cervical spine pinched nerve   CKD (chronic kidney disease), stage III [facility])    nephrologist--- dr Wolfgang Phoenix;   due to tubular necrosis   Claustrophobia    Depression    Edema of both lower extremities    GERD (gastroesophageal reflux disease)    Headache    Hemorrhoids    w/ intermittant bleeding   History of adenomatous polyp of colon    History of kidney  stones    Hx-TIA (transient ischemic attack) 02/26/2016   per pt no residual   Hydronephrosis, left    followed by urologist--- dr Retta Diones;   persistant   Hyperlipidemia    Hypertension    Iron deficiency    followed by [facility] w/ hx iron infusions   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   oncologist--- [facility];  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, active survillence   Lactose intolerance in adult 02/01/2016   Left ureteral calculus    Lumbar disc disease with radiculopathy    Nephrolithiasis    per CT 08-11-2021  left > right   Neuropathy, peripheral    Nocturia more than twice per night    Normocytic anemia    OSA (obstructive sleep apnea)    per study 2007;   per pt no Bipap use since 2021   Renal atrophy, bilateral    Renal cyst, right    Sciatica    Sigmoid diverticulosis    Type 2 diabetes mellitus (HCC)     Past Surgical History:  Procedure Laterality Date   BIOPSY N/A 03/17/2013   Procedure: GASTRIC BIOPSIES;  Surgeon: West Bali, MD;  Location: [facility];  Service: Endoscopy;  Laterality: N/A;   BIOPSY  06/10/2015   Procedure: BIOPSY;  Surgeon: West Bali, MD;  [facility];  Service: Endoscopy;;  gastric biopsy   CARDIAC CATHETERIZATION  05/11/2003   @ [facility] by dr t. Tresa Endo;  Abnormal cardiolite/   Normal coronary arteries and normal LVF,  ef  63%   CARDIOVASCULAR STRESS TEST  01/01/2014   normal lexiscan cardiolite/  no ischemia/ infarct/  normal LV function and wall motion ,  ef 81%   COLONOSCOPY WITH PROPOFOL N/A 02/26/2017   Procedure: COLONOSCOPY WITH PROPOFOL;  Surgeon: West Bali, MD;  [facility];  Service: Endoscopy;  Laterality: N/A;  11:00am   COLONOSCOPY WITH PROPOFOL N/A 09/22/2019   Procedure: COLONOSCOPY WITH PROPOFOL;  Surgeon: West Bali, MD;  [facility];  Service: Endoscopy;  Laterality: N/A;  1:15   CYSTO/   RIGHT URETEROSOCOPY LASER  LITHOTRIPSY STONE EXTRACTION  10/13/2004   CYSTO/  RIGHT RETROGRADE PYELOGRAM/  PLACMENT RIGHT URETERAL STENT  01/10/2010   CYSTOSCOPY W/ URETERAL STENT PLACEMENT Right 08/19/2015   Procedure: CYSTOSCOPY WITH RETROGRADE PYELOGRAM/URETERAL STENT PLACEMENT;  Surgeon: Marcine Matar, MD;  [facility]: [facility];  Service: Urology;  Laterality: Right;   CYSTOSCOPY W/ URETERAL STENT PLACEMENT Left 02/23/2020   Procedure: CYSTOSCOPY LEFT  RETROGRADE PYELOGRAM LEFT URETEROSCOPY URETERAL STENT PLACEMENT;  Surgeon: Marcine Matar, MD;  [facility]: [facility];  Service: Urology;  Laterality: Left;   CYSTOSCOPY W/ URETERAL STENT PLACEMENT Right 09/24/2022   Procedure: CYSTOSCOPY WITH RETROGRADE PYELOGRAM/URETERAL STENT PLACEMENT;  Surgeon: Malen Gauze, MD;  [facility]: [facility];  Service: Urology;  Laterality: Right;   CYSTOSCOPY WITH RETROGRADE PYELOGRAM, URETEROSCOPY AND STENT PLACEMENT Left 10/21/2014   Procedure: CYSTOSCOPY WITH RETROGRADE PYELOGRAM, URETEROSCOPY AND STENT PLACEMENT;  Surgeon: Marcine Matar, MD;  Location[facility];  Service: Urology;  Laterality: Left;   CYSTOSCOPY WITH RETROGRADE PYELOGRAM, URETEROSCOPY AND STENT PLACEMENT Right 11/22/2015   Procedure: CYSTOSCOPY, RIGHT URETERAL STENT REMOVAL; RIGHT RETROGRADE PYELOGRAM, RIGHT URETEROSCOPY WITH BALLOON DILATION; RIGHT URETERAL STENT PLACEMENT;  Surgeon: Marcine Matar, MD;  [facility];  Service: Urology;  Laterality: Right;   CYSTOSCOPY WITH RETROGRADE PYELOGRAM, URETEROSCOPY AND STENT PLACEMENT Left 10/18/2020   Procedure: CYSTOSCOPY WITH LEFT RETROGRADE PYELOGRAM, LEFT URETEROSCOPY  WITH LASER AND LEFT URETERAL STENT PLACEMENT;  Surgeon: Marcine Matar, MD;  [facility];  Service: Urology;  Laterality: Left;   CYSTOSCOPY WITH RETROGRADE PYELOGRAM, URETEROSCOPY AND STENT PLACEMENT Left 10/26/2021   Procedure: CYSTOSCOPY WITH RETROGRADE PYELOGRAM, URETEROSCOPY, STONE EXTRACTION  AND STENT PLACEMENT;   Surgeon: Marcine Matar, MD;  Location: [facility];  Service: Urology;  Laterality: Left;   CYSTOSCOPY/RETROGRADE/URETEROSCOPY/STONE EXTRACTION WITH BASKET Bilateral 08/02/2015   Procedure: CYSTOSCOPY; BILATERAL RETROGRADE PYELOGRAMS; BILATERAL URETEROSCOPY, STONE EXTRACTION WITH BASKET;  Surgeon: Marcine Matar, MD;  [facility];  Service: Urology;  Laterality: Bilateral;   CYSTOSCOPY/RETROGRADE/URETEROSCOPY/STONE EXTRACTION WITH BASKET Right 06/28/2015   Procedure: CYSTOSCOPY/RETROGRADE/URETEROSCOPY/STONE EXTRACTION WITH BASKET, RIGHT URETERAL DOUBLE J STENT PLACEMENT;  Surgeon: Marcine Matar, MD;  [facility];  Service: Urology;  Laterality: Right;   ESOPHAGOGASTRODUODENOSCOPY (EGD) WITH PROPOFOL N/A 03/17/2013   Procedure: ESOPHAGOGASTRODUODENOSCOPY (EGD) WITH PROPOFOL;  Surgeon: West Bali, MD;  [facility]: [facility];  Service: Endoscopy;  Laterality: N/A;   ESOPHAGOGASTRODUODENOSCOPY (EGD) WITH PROPOFOL N/A 06/10/2015   Distal gastritis, distal esophageal stricture s/p dilation   ESOPHAGOGASTRODUODENOSCOPY (EGD) WITH PROPOFOL N/A 02/26/2017   Procedure: ESOPHAGOGASTRODUODENOSCOPY (EGD) WITH PROPOFOL;  Surgeon: West Bali, MD;  [facility];  Service: Endoscopy;  Laterality: N/A;   FLEXIBLE SIGMOIDOSCOPY  09/11/2011   WUJ:[id number] Internal hemorrhoids   HOLMIUM LASER APPLICATION Left 10/21/2014   Procedure: HOLMIUM LASER APPLICATION;  Surgeon: Marcine Matar, MD;  Location: [facility];  Service: Urology;  Laterality: Left;   HOLMIUM LASER APPLICATION Right 06/28/2015   Procedure: HOLMIUM LASER APPLICATION;  Surgeon: Marcine Matar, MD;  [facility];  Service: Urology;  Laterality: Right;   HOLMIUM LASER APPLICATION  02/23/2020   Procedure: HOLMIUM LASER APPLICATION;  Surgeon: Marcine Matar, MD;  [facility];  Service: Urology;;   HOLMIUM LASER APPLICATION Left 10/18/2020   Procedure: HOLMIUM LASER  APPLICATION;  Surgeon: Marcine Matar, MD;  [facility];  Service: Urology;  Laterality: Left;   MASS EXCISION Left 03/04/2015   Procedure: EXCISION OF SOFT TISSUE NEOPLASM LEFT ARM;  Surgeon: Franky Macho, MD;  Location: [facility];  Service: General;  Laterality: Left;   PERCUTANEOUS NEPHROSTOLITHOTOMY Bilateral 05/2010   @[facility]    POLYPECTOMY N/A 03/17/2013   Procedure: GASTRIC POLYPECTOMY;  Surgeon: West Bali, MD;  [facility];  Service: Endoscopy;  Laterality: N/A;   POLYPECTOMY  02/26/2017   Procedure: POLYPECTOMY;  Surgeon: West Bali, MD;  [facility];  Service: Endoscopy;;  polyp at cecum, ascending colon polyps x3, hepatic flexure polyps x8, transverse colon polyps x8    POLYPECTOMY  09/22/2019   Procedure: POLYPECTOMY;  Surgeon: West Bali, MD;  [facility];  Service: Endoscopy;;   REMOVAL RIGHT THIGH CYST  2006   SAVORY DILATION N/A 03/17/2013   Procedure: SAVORY DILATION;  Surgeon: West Bali, MD;  Location[facility];  Service: Endoscopy;  Laterality: N/A;  #12.8, 14, 15, 16 dilators used   SAVORY DILATION N/A 06/10/2015   Procedure: SAVORY DILATION;  Surgeon: West Bali, MD;  [facility];  Service: Endoscopy;  Laterality: N/A;   VAGINAL HYSTERECTOMY  1970's    Social History   Socioeconomic History   Marital status: Divorced    Spouse name: Not on file   Number of children: 2   Years of education: 12   Highest education level: 12th grade  Occupational History   Occupation: retired   Tobacco Use   Smoking status: Former    Packs/day: 1.00    Years: 20.00    Additional pack years: 0.00    Total pack years: 20.00    Types: Cigarettes    Start date: 09/21/1974    Quit date: 09/20/1988    Years since quitting: 34.0    Passive exposure: Past   Smokeless tobacco: Never  Vaping Use   Vaping Use: Never used  Substance and Sexual Activity   Alcohol use: No    Alcohol/week: 0.0 standard drinks of alcohol    Drug use: No   Sexual activity: Not Currently    Partners: Male    Birth control/protection: Surgical  Other Topics Concern   Not on file  Social History Narrative   Patient is right handed   Patient drinks some caffeine daily.   Social Determinants of Health   Financial Resource Strain: Low Risk  (07/06/2022)   Overall Financial Resource Strain (CARDIA)    Difficulty of Paying Living Expenses: Not hard at all  Food Insecurity: No Food Insecurity (10/17/2022)   Hunger Vital Sign    Worried About Running Out of Food in the Last Year: Never true    Ran Out of Food in the Last Year: Never true  Transportation Needs: No Transportation Needs (10/17/2022)   PRAPARE - Administrator, Civil Service (Medical): No    Lack of Transportation (Non-Medical): No  Physical Activity: Inactive (07/06/2022)   Exercise Vital Sign    Days of Exercise per Week: 0 days    Minutes of Exercise per Session: 0 min  Stress: No Stress Concern Present (07/06/2022)   [employer] of Occupational Health - Occupational Stress Questionnaire    Feeling of Stress : Not at all  Social Connections: Moderately Isolated (07/06/2022)   Social Connection and Isolation Panel [NHANES]    Frequency of Communication with Friends and Family: More than three times a week    Frequency of Social Gatherings with Friends and Family: Once a week    Attends Religious Services: More than 4 times per year    Active Member of [employer] or Organizations: No    Attends Banker Meetings: Never    Marital Status: Divorced  Catering manager Violence: Not At Risk (10/17/2022)   Humiliation, Afraid, Rape, and Kick questionnaire    Fear of Current or Ex-Partner: No    Emotionally Abused: No    Physically Abused: No    Sexually Abused: No    Family History  Problem Relation Age of Onset   Hypertension Mother    Diabetes Mother    Heart failure Mother    Dementia Mother    Emphysema Father    Hypertension Father     Diabetes Brother    GER disease Brother    Hypertension Sister    Hypertension Sister    Cancer Other        family history    Diabetes Other        family history    Heart defect Other        famiily history    Arthritis Other        family history    Anesthesia problems Neg Hx    Hypotension Neg Hx    Malignant hyperthermia Neg Hx    Pseudochol deficiency Neg Hx    Colon cancer Neg Hx     Anti-infectives: Anti-infectives (From admission, onward)    Start     Dose/Rate Route Frequency Ordered Stop   10/17/22 2200  cefTRIAXone (ROCEPHIN) 1 g in sodium chloride 0.9 % 100 mL IVPB        1 g 200 mL/hr over 30 Minutes Intravenous Every 24 hours 10/17/22 2017     10/17/22 1800  levofloxacin (LEVAQUIN) IVPB 500 mg        500 mg 100 mL/hr over 60 Minutes Intravenous  Once 10/17/22 1747 10/17/22 1906       Current Facility-Administered Medications  Medication Dose Route Frequency Provider Last Rate Last Admin   0.9 %  sodium chloride infusion   Intravenous Continuous Elgergawy, Leana Roe, MD 50 mL/hr at 10/18/22 0719 Infusion Verify at 10/18/22 0719   acetaminophen (TYLENOL) tablet 650 mg  650 mg Oral Q6H PRN Elgergawy, Leana Roe, MD       Or   acetaminophen (TYLENOL) suppository 650 mg  650 mg Rectal Q6H PRN Elgergawy, Leana Roe, MD       amLODipine (NORVASC) tablet 10 mg  10 mg Oral Daily Elgergawy, Leana Roe, MD   10 mg at 10/18/22 0901   aspirin EC tablet 81 mg  81 mg Oral Daily Elgergawy, Leana Roe, MD   81 mg at 10/18/22 0901   cefTRIAXone (ROCEPHIN) 1 g in sodium chloride  0.9 % 100 mL IVPB  1 g Intravenous Q24H Elgergawy, Leana Roe, MD 200 mL/hr at 10/17/22 2242 1 g at 10/17/22 2242   folic acid (FOLVITE) tablet 1 mg  1 mg Oral Daily Elgergawy, Leana Roe, MD   1 mg at 10/18/22 0901   gabapentin (NEURONTIN) capsule 300 mg  300 mg Oral TID PRN Elgergawy, Leana Roe, MD   300 mg at 10/17/22 2251   heparin injection 5,000 Units  5,000 Units Subcutaneous Q8H Elgergawy, Leana Roe, MD        hydrALAZINE (APRESOLINE) injection 5 mg  5 mg Intravenous Q4H PRN Elgergawy, Leana Roe, MD       insulin aspart (novoLOG) injection 0-15 Units  0-15 Units Subcutaneous TID WC Elgergawy, Leana Roe, MD       insulin aspart (novoLOG) injection 0-5 Units  0-5 Units Subcutaneous QHS Elgergawy, Leana Roe, MD       isosorbide mononitrate (IMDUR) 24 hr tablet 15 mg  15 mg Oral Daily Elgergawy, Leana Roe, MD       mometasone-formoterol (DULERA) 100-5 MCG/ACT inhaler 2 puff  2 puff Inhalation BID Elgergawy, Leana Roe, MD   2 puff at 10/18/22 0830   morphine (PF) 2 MG/ML injection 2 mg  2 mg Intravenous Q4H PRN Elgergawy, Leana Roe, MD       nitroGLYCERIN (NITROSTAT) SL tablet 0.4 mg  0.4 mg Sublingual Q5 min PRN Elgergawy, Leana Roe, MD       oxyCODONE (Oxy IR/ROXICODONE) immediate release tablet 5 mg  5 mg Oral Q4H PRN Elgergawy, Leana Roe, MD   5 mg at 10/18/22 0409   pantoprazole (PROTONIX) EC tablet 40 mg  40 mg Oral BID Elgergawy, Leana Roe, MD   40 mg at 10/18/22 0901   PARoxetine (PAXIL) tablet 40 mg  40 mg Oral q AM Elgergawy, Leana Roe, MD   40 mg at 10/18/22 0657   rosuvastatin (CRESTOR) tablet 40 mg  40 mg Oral Daily Elgergawy, Leana Roe, MD   40 mg at 10/18/22 0900   vitamin B-12 (CYANOCOBALAMIN) tablet 500 mcg  500 mcg Oral Daily Elgergawy, Leana Roe, MD   500 mcg at 10/18/22 0901     Objective: Vital signs in last 24 hours: BP 105/65 (BP Location: Right Arm)   Pulse (!) 102   Temp 98.8 F (37.1 C)   Resp 20   Ht 5\' 6"  (1.676 m)   Wt 82.7 kg   SpO2 90%   BMI 29.44 kg/m   Intake/Output from previous day: 05/08 0701 - 05/09 0700 In: 1080.5 [P.O.:240; I.V.:240.5; IV Piggyback:600] Out: 1300 [Urine:1300] Intake/Output this shift: Total I/O In: 394.1 [P.O.:240; I.V.:154.1] Out: 350 [Urine:350]   Physical Exam Vitals reviewed.  Constitutional:      Appearance: Normal appearance.  Abdominal:     Palpations: Abdomen is soft.     Tenderness: There is no abdominal tenderness. There is  no right CVA tenderness or left CVA tenderness.  Neurological:     Mental Status: She is alert.    Lab Results:  Results for orders placed or performed during the hospital encounter of 10/17/22 (from the past 24 hour(s))  CBC with Differential     Status: Abnormal   Collection Time: 10/17/22  1:34 PM  Result Value Ref Range   WBC 12.5 (H) 4.0 - 10.5 K/uL   RBC 3.70 (L) 3.87 - 5.11 MIL/uL   Hemoglobin 11.1 (L) 12.0 - 15.0 g/dL   HCT 57.8 (L) 46.9 - 62.9 %  MCV 94.9 80.0 - 100.0 fL   MCH 30.0 26.0 - 34.0 pg   MCHC 31.6 30.0 - 36.0 g/dL   RDW 87.5 (H) 64.3 - 32.9 %   Platelets 263 150 - 400 K/uL   nRBC 0.0 0.0 - 0.2 %   Neutrophils Relative % 74 %   Neutro Abs 9.3 (H) 1.7 - 7.7 K/uL   Lymphocytes Relative 11 %   Lymphs Abs 1.3 0.7 - 4.0 K/uL   Monocytes Relative 10 %   Monocytes Absolute 1.2 (H) 0.1 - 1.0 K/uL   Eosinophils Relative 5 %   Eosinophils Absolute 0.6 (H) 0.0 - 0.5 K/uL   Basophils Relative 0 %   Basophils Absolute 0.0 0.0 - 0.1 K/uL   Immature Granulocytes 0 %   Abs Immature Granulocytes 0.05 0.00 - 0.07 K/uL  Comprehensive metabolic panel     Status: Abnormal   Collection Time: 10/17/22  1:34 PM  Result Value Ref Range   Sodium 132 (L) 135 - 145 mmol/L   Potassium 3.6 3.5 - 5.1 mmol/L   Chloride 101 98 - 111 mmol/L   CO2 22 22 - 32 mmol/L   Glucose, Bld 123 (H) 70 - 99 mg/dL   BUN 18 8 - 23 mg/dL   Creatinine, Ser 5.18 (H) 0.44 - 1.00 mg/dL   Calcium 9.4 8.9 - 84.1 mg/dL   Total Protein 8.4 (H) 6.5 - 8.1 g/dL   Albumin 3.6 3.5 - 5.0 g/dL   AST 15 15 - 41 U/L   ALT 14 0 - 44 U/L   Alkaline Phosphatase 77 38 - 126 U/L   Total Bilirubin 0.3 0.3 - 1.2 mg/dL   GFR, Estimated 33 (L) >60 mL/min   Anion gap 9 5 - 15  Lipase, blood     Status: None   Collection Time: 10/17/22  1:34 PM  Result Value Ref Range   Lipase 39 11 - 51 U/L  Urinalysis, Routine w reflex microscopic -Urine, Catheterized     Status: Abnormal   Collection Time: 10/17/22  4:58 PM   Result Value Ref Range   Color, Urine YELLOW YELLOW   APPearance CLOUDY (A) CLEAR   Specific Gravity, Urine 1.012 1.005 - 1.030   pH 7.0 5.0 - 8.0   Glucose, UA NEGATIVE NEGATIVE mg/dL   Hgb urine dipstick LARGE (A) NEGATIVE   Bilirubin Urine NEGATIVE NEGATIVE   Ketones, ur NEGATIVE NEGATIVE mg/dL   Protein, ur 660 (A) NEGATIVE mg/dL   Nitrite NEGATIVE NEGATIVE   Leukocytes,Ua LARGE (A) NEGATIVE   RBC / HPF >50 0 - 5 RBC/hpf   WBC, UA >50 0 - 5 WBC/hpf   Bacteria, UA NONE SEEN NONE SEEN   Squamous Epithelial / HPF 0-5 0 - 5 /HPF   WBC Clumps PRESENT   CBG monitoring, ED     Status: None   Collection Time: 10/17/22  6:04 PM  Result Value Ref Range   Glucose-Capillary 80 70 - 99 mg/dL   Comment 1 Notify RN    Comment 2 Document in Chart   Glucose, capillary     Status: Abnormal   Collection Time: 10/17/22  9:21 PM  Result Value Ref Range   Glucose-Capillary 101 (H) 70 - 99 mg/dL   Comment 1 Notify RN    Comment 2 Document in Chart   Basic metabolic panel     Status: Abnormal   Collection Time: 10/18/22  5:05 AM  Result Value Ref Range   Sodium 136 135 -  145 mmol/L   Potassium 4.2 3.5 - 5.1 mmol/L   Chloride 105 98 - 111 mmol/L   CO2 24 22 - 32 mmol/L   Glucose, Bld 94 70 - 99 mg/dL   BUN 16 8 - 23 mg/dL   Creatinine, Ser 7.82 (H) 0.44 - 1.00 mg/dL   Calcium 9.6 8.9 - 95.6 mg/dL   GFR, Estimated 33 (L) >60 mL/min   Anion gap 7 5 - 15  CBC     Status: Abnormal   Collection Time: 10/18/22  5:05 AM  Result Value Ref Range   WBC 10.2 4.0 - 10.5 K/uL   RBC 3.46 (L) 3.87 - 5.11 MIL/uL   Hemoglobin 10.2 (L) 12.0 - 15.0 g/dL   HCT 21.3 (L) 08.6 - 57.8 %   MCV 95.1 80.0 - 100.0 fL   MCH 29.5 26.0 - 34.0 pg   MCHC 31.0 30.0 - 36.0 g/dL   RDW 46.9 (H) 62.9 - 52.8 %   Platelets 250 150 - 400 K/uL   nRBC 0.0 0.0 - 0.2 %  Glucose, capillary     Status: None   Collection Time: 10/18/22  7:49 AM  Result Value Ref Range   Glucose-Capillary 86 70 - 99 mg/dL  Glucose, capillary      Status: None   Collection Time: 10/18/22 11:28 AM  Result Value Ref Range   Glucose-Capillary 85 70 - 99 mg/dL   *Note: Due to a large number of results and/or encounters for the requested time period, some results have not been displayed. A complete set of results can be found in Results Review.    BMET Recent Labs    10/17/22 1334 10/18/22 0505  NA 132* 136  K 3.6 4.2  CL 101 105  CO2 22 24  GLUCOSE 123* 94  BUN 18 16  CREATININE 1.60* 1.59*  CALCIUM 9.4 9.6   PT/INR No results for input(s): "LABPROT", "INR" in the last 72 hours. ABG No results for input(s): "PHART", "HCO3" in the last 72 hours.  Invalid input(s): "PCO2", "PO2"  Studies/Results: CT RENAL STONE STUDY  Result Date: 10/17/2022 CLINICAL DATA:  Left lower quadrant abdominal/flank pain. Recent urinary tract infection. EXAM: CT ABDOMEN AND PELVIS WITHOUT CONTRAST TECHNIQUE: Multidetector CT imaging of the abdomen and pelvis was performed following the standard protocol without IV contrast. RADIATION DOSE REDUCTION: This exam was performed according to the departmental dose-optimization program which includes automated exposure control, adjustment of the mA and/or kV according to patient size and/or use of iterative reconstruction technique. COMPARISON:  CT 09/23/2022, 02/13/2022 FINDINGS: Lower chest: Bandlike subsegmental atelectasis in the right middle and lower lobe related to elevated hemidiaphragm. 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 No acute airspace disease. Hepatobiliary: Unremarkable unenhanced appearance of the liver. Gallbladder physiologically distended, no calcified stone. No biliary dilatation. Pancreas: No ductal dilatation or inflammation. Spleen: Normal in size without focal abnormality. Adrenals/Urinary Tract: No adrenal nodule. Placement of right nephroureteral stent since prior exam. Proximal pigtails coiled in the dilated renal pelvis, the distal pigtail is coiled in the bladder. 3 mm  stone fragment adjacent to the distal stent series 2, image 72. There is diminished right hydronephrosis from prior exam with persistent dilatation of the renal pelvis and ureter. Mild background thinning of right renal parenchyma. There is no evidence of renal fluid collection. Hypodense right renal lesion on prior exam is not well seen in the absence of IV contrast. Progressive left hydroureteronephrosis from prior. There is a punctate stone in the  distal left ureter, series 5, image 77 causing proximal obstruction. Multiple additional punctate nonobstructing left intrarenal calculi. There is mild thinning of the left renal parenchyma. Stomach/Bowel: Unremarkable appearance of the stomach. Small duodenal diverticulum. There is no bowel obstruction or inflammation. Normal appendix. Left colonic diverticulosis, most prominent in the sigmoid. No diverticulitis. Vascular/Lymphatic: Mild aortic atherosclerosis. No aneurysm. No enlarged lymph nodes in the abdomen or pelvis. Reproductive: Status post hysterectomy. No adnexal masses. Other: No free air or free fluid. Scattered subcutaneous densities in the lower anterior abdominal wall are typical of medication injection sites. Tiny fat containing umbilical hernia. There is minimal fat in both inguinal canals. Musculoskeletal: Lumbar facet hypertrophy. There are no acute or suspicious osseous abnormalities. Left gluteal subcutaneous granulomas. IMPRESSION: 1. Punctate distal left ureteric stone causing moderate proximal hydroureteronephrosis. Increased hydronephrosis from prior exam. Multiple additional punctate nonobstructing left intrarenal calculi. 2. Placement of right nephroureteral stent since prior exam. Diminished right hydronephrosis with persistent renal pelvis dilatation. 3 mm stone is seen adjacent to the distal portion of the stent. 3. Colonic diverticulosis without diverticulitis. Aortic Atherosclerosis (ICD10-I70.0). Electronically Signed   By: Narda Rutherford M.D.   On: 10/17/2022 15:06     Assessment/Plan: Punctate left ureteral stone with obstruction.  Her pain is controlled at this time.  She is scheduled for cystoscopy with right ureteroscopy for 10/25/22 and it would be reasonable to do a left retrograde pyelogram and ureteroscopy for the left ureteral stone at that time if her pain remains controlled and she doesn't have signs of sepsis.  Right ureteral stone with stent.  She will continue with planned procedure next week.  UTI.  She had enterococcus on her culture on 5/3 and is on Cipro since yesterday. She remains afebrile and should continue that pending upcoming procedure.   CKD with acute exacerbation.  Her Cr is up slightly from baseline but not enough to merit acute intervention with planned procedure next week.       She is scheduled for cystoscopy with ureteroscopy on 5/16.   CC: Dr. Standley Dakins.      Bjorn Pippin 10/18/2022

## 2022-10-18 NOTE — TOC Initial Note (Signed)
Transition of Care [facility]) - Initial/Assessment Note    Patient Details  Name: Cheryl Abbott MRN: [id number] Date of Birth: 1945/11/01  Transition of Care [facility]) CM/SW Contact:    Elliot Gault, LCSW Phone Number: 10/18/2022, 10:51 AM  Clinical Narrative:                  Pt admitted from home. Pt known to TOC from recent admission. When pt discharged last month, she went home with [facility] for PT/OT and they are still active with her at home.  Pt lives alone. She has someone who helps with dishes and groceries and her children pay a friend to help with transportation and other tasks.   Pt plans to return home at dc with continued PT/OT from [facility].   TOC will follow and assist with dc planning.  Expected Discharge Plan: Home w Home Health Services Barriers to Discharge: Continued Medical Work up   Patient Goals and CMS Choice Patient states their goals for this hospitalization and ongoing recovery are:: return home CMS Medicare.gov Compare Post Acute Care list provided to:: Patient        Expected Discharge Plan and Services In-house Referral: Clinical Social Work   Post Acute Care Choice: Resumption of Svcs/PTA Provider Living arrangements for the past 2 months: Single Family Home                                      Prior Living Arrangements/Services Living arrangements for the past 2 months: Single Family Home Lives with:: Self Patient language and need for interpreter reviewed:: Yes Do you feel safe going back to the place where you live?: Yes      Need for Family Participation in Patient Care: No (Comment)   Current home services: DME, Home PT, Home OT Criminal Activity/Legal Involvement Pertinent to Current Situation/Hospitalization: No - Comment as needed  Activities of Daily Living Home Assistive Devices/Equipment: Cane (specify quad or straight), CBG Meter, Eyeglasses ADL Screening (condition at time of admission) Patient's cognitive ability adequate to  safely complete daily activities?: No Is the patient deaf or have difficulty hearing?: No Does the patient have difficulty seeing, even when wearing glasses/contacts?: No Does the patient have difficulty concentrating, remembering, or making decisions?: No Patient able to express need for assistance with ADLs?: Yes Does the patient have difficulty dressing or bathing?: No Independently performs ADLs?: Yes (appropriate for developmental age) Communication: Independent Dressing (OT): Independent Grooming: Independent Feeding: Independent Bathing: Needs assistance Is this a change from baseline?: Pre-admission baseline Toileting: Needs assistance Is this a change from baseline?: Pre-admission baseline In/Out Bed: Needs assistance Is this a change from baseline?: Pre-admission baseline Walks in Home: Independent with device (comment) Does the patient have difficulty walking or climbing stairs?: No Weakness of Legs: Both Weakness of Arms/Hands: Both  Permission Sought/Granted                  Emotional Assessment Appearance:: Appears stated age Attitude/Demeanor/Rapport: Engaged Affect (typically observed): Pleasant Orientation: : Oriented to Self, Oriented to Place, Oriented to  Time, Oriented to Situation Alcohol / Substance Use: Not Applicable Psych Involvement: No (comment)  Admission diagnosis:  Ureterolithiasis [N20.1] Complicated UTI (urinary tract infection) [N39.0] Patient Active Problem List   Diagnosis Date Noted   Complicated UTI (urinary tract infection) 10/17/2022   Encounter for support and coordination of transition of care 10/14/2022   Hydroureteronephrosis 09/23/2022  Encounter for Medicare annual examination with abnormal findings 07/13/2022   Ganglion cyst of dorsum of left wrist 07/13/2022   Knee pain, right 07/13/2022   Hx of adenomatous colonic polyps 05/08/2022   PVD (peripheral vascular disease) (HCC) 03/21/2022   Ingrown nail of great toe  07/28/2021   Tick bite of ankle 07/28/2021   Fatigue 07/06/2021   Hypokalemia 06/12/2021   Ureteral calculus, right 05/30/2021   Hydronephrosis with urinary obstruction due to ureteral calculus 05/30/2021   Abdominal distension 05/03/2021   Nasal congestion 03/27/2021   Herniated nucleus pulposus, C5-6 12/26/2020   Herniated nucleus pulposus, C6-7 12/26/2020   Cervical radiculitis 11/14/2020   Numbness of hand 11/14/2020   Rash and nonspecific skin eruption 06/25/2020   Iron deficiency anemia 02/10/2020   Flank pain 01/11/2020   Pelvic pressure in female 11/17/2019   Adenomatous polyps    Cervical spondylosis 07/13/2019   Cervical spondylosis with radiculopathy 07/13/2019   Muscle cramps 07/01/2019   Depression, major, single episode, in partial remission (HCC) 03/13/2019   Eyelid cyst, right 01/15/2019   Urinary tract infection 01/04/2019   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   Chronic left shoulder pain 02/28/2018   Palpable mass of neck 02/28/2018   Insomnia 11/09/2017   Multiple myeloma (HCC) 03/15/2017   Urinary frequency 02/28/2017   Monoclonal paraproteinemia 02/15/2017   Fall at home, initial encounter 08/19/2015   CKD (chronic kidney disease) stage 3, GFR 30-59 ml/min (HCC) 07/12/2015   Dysuria 01/11/2015   Anemia 10/14/2014   Hypoxia 02/04/2014   Obesity (BMI 30.0-34.9) 01/02/2014   Generalized anxiety disorder 05/20/2013   Chronic right shoulder pain 02/02/2013   Anxiety and depression 08/13/2012   Low back pain with left-sided sciatica 12/22/2010   Colon adenomas 05/11/2009   GERD 03/15/2009   FATTY LIVER DISEASE 03/15/2009   RENAL CALCULUS 03/15/2009   Chest pain 01/19/2009   Sleep apnea 09/15/2008   Type 2 diabetes with nephropathy (HCC) 02/02/2008   Hyperlipidemia LDL goal <100 09/17/2007   Hypertension goal BP (blood pressure) < 130/80 09/17/2007   PCP:  Kerri Perches, MD Pharmacy:   [facility] [phone] - [place], [place] - [address] AT [facility] [address] [place] [address] Phone: [phone] Fax: [phone]     Social Determinants of Health (SDOH) Social History: SDOH Screenings   Food Insecurity: No Food Insecurity (10/17/2022)  Housing: Low Risk  (10/17/2022)  Transportation Needs: No Transportation Needs (10/17/2022)  Utilities: Not At Risk (10/17/2022)  Alcohol Screen: Low Risk  (07/06/2022)  Depression (PHQ2-9): Low Risk  (10/11/2022)  Financial Resource Strain: Low Risk  (07/06/2022)  Physical Activity: Inactive (07/06/2022)  Social Connections: Moderately Isolated (07/06/2022)  Stress: No Stress Concern Present (07/06/2022)  Tobacco Use: Medium Risk (10/17/2022)   SDOH Interventions:     Readmission Risk Interventions     No data to display

## 2022-10-19 DIAGNOSIS — N183 Chronic kidney disease, stage 3 unspecified: Secondary | ICD-10-CM | POA: Diagnosis not present

## 2022-10-19 DIAGNOSIS — N390 Urinary tract infection, site not specified: Secondary | ICD-10-CM | POA: Diagnosis not present

## 2022-10-19 DIAGNOSIS — N1330 Unspecified hydronephrosis: Secondary | ICD-10-CM | POA: Diagnosis not present

## 2022-10-19 LAB — BASIC METABOLIC PANEL
Anion gap: 8 (ref 5–15)
BUN: 13 mg/dL (ref 8–23)
CO2: 24 mmol/L (ref 22–32)
Calcium: 9.8 mg/dL (ref 8.9–10.3)
Chloride: 105 mmol/L (ref 98–111)
Creatinine, Ser: 1.44 mg/dL — ABNORMAL HIGH (ref 0.44–1.00)
GFR, Estimated: 37 mL/min — ABNORMAL LOW (ref >60)
Glucose, Bld: 105 mg/dL — ABNORMAL HIGH (ref 70–99)
Potassium: 3.9 mmol/L (ref 3.5–5.1)
Sodium: 137 mmol/L (ref 135–145)

## 2022-10-19 LAB — CBC
HCT: 32.6 % — ABNORMAL LOW (ref 36.0–46.0)
Hemoglobin: 10.3 g/dL — ABNORMAL LOW (ref 12.0–15.0)
MCH: 29.7 pg (ref 26.0–34.0)
MCHC: 31.6 g/dL (ref 30.0–36.0)
MCV: 93.9 fL (ref 80.0–100.0)
Platelets: 247 10*3/uL (ref 150–400)
RBC: 3.47 MIL/uL — ABNORMAL LOW (ref 3.87–5.11)
RDW: 18.2 % — ABNORMAL HIGH (ref 11.5–15.5)
WBC: 7.6 10*3/uL (ref 4.0–10.5)
nRBC: 0 % (ref 0.0–0.2)

## 2022-10-19 LAB — GLUCOSE, CAPILLARY
Glucose-Capillary: 100 mg/dL — ABNORMAL HIGH (ref 70–99)
Glucose-Capillary: 103 mg/dL — ABNORMAL HIGH (ref 70–99)

## 2022-10-19 LAB — MAGNESIUM: Magnesium: 2 mg/dL (ref 1.7–2.4)

## 2022-10-19 MED ORDER — GABAPENTIN 300 MG PO CAPS
300.0000 mg | ORAL_CAPSULE | Freq: Three times a day (TID) | ORAL | Status: DC | PRN
Start: 2022-10-19 — End: 2022-11-12

## 2022-10-19 MED ORDER — ACETAMINOPHEN 500 MG PO TABS: 1000.0000 mg | ORAL_TABLET | Freq: Every day | ORAL | Status: AC

## 2022-10-19 MED ORDER — MORPHINE SULFATE (PF) 2 MG/ML IV SOLN: 1.0000 mg | INTRAVENOUS | Status: DC | PRN

## 2022-10-19 MED ORDER — ROSUVASTATIN CALCIUM 20 MG PO TABS
20.0000 mg | ORAL_TABLET | Freq: Every day | ORAL | Status: DC
  Administered 2022-10-19: 20 mg via ORAL
  Filled 2022-10-19: qty 1

## 2022-10-19 NOTE — Discharge Instructions (Signed)
IMPORTANT INFORMATION: PAY CLOSE ATTENTION   PHYSICIAN DISCHARGE INSTRUCTIONS  Follow with Primary care provider  Simpson, Margaret E, MD  and other consultants as instructed by your Hospitalist Physician  SEEK MEDICAL CARE OR RETURN TO EMERGENCY ROOM IF SYMPTOMS COME BACK, WORSEN OR NEW PROBLEM DEVELOPS   Please note: You were cared for by a hospitalist during your hospital stay. Every effort will be made to forward records to your primary care provider.  You can request that your primary care provider send for your hospital records if they have not received them.  Once you are discharged, your primary care physician will handle any further medical issues. Please note that NO REFILLS for any discharge medications will be authorized once you are discharged, as it is imperative that you return to your primary care physician (or establish a relationship with a primary care physician if you do not have one) for your post hospital discharge needs so that they can reassess your need for medications and monitor your lab values.  Please get a complete blood count and chemistry panel checked by your Primary MD at your next visit, and again as instructed by your Primary MD.  Get Medicines reviewed and adjusted: Please take all your medications with you for your next visit with your Primary MD  Laboratory/radiological data: Please request your Primary MD to go over all hospital tests and procedure/radiological results at the follow up, please ask your primary care provider to get all Hospital records sent to his/her office.  In some cases, they will be blood work, cultures and biopsy results pending at the time of your discharge. Please request that your primary care provider follow up on these results.  If you are diabetic, please bring your blood sugar readings with you to your follow up appointment with primary care.    Please call and make your follow up appointments as soon as possible.    Also  Note the following: If you experience worsening of your admission symptoms, develop shortness of breath, life threatening emergency, suicidal or homicidal thoughts you must seek medical attention immediately by calling 911 or calling your MD immediately  if symptoms less severe.  You must read complete instructions/literature along with all the possible adverse reactions/side effects for all the Medicines you take and that have been prescribed to you. Take any new Medicines after you have completely understood and accpet all the possible adverse reactions/side effects.   Do not drive when taking Pain medications or sleeping medications (Benzodiazepines)  Do not take more than prescribed Pain, Sleep and Anxiety Medications. It is not advisable to combine anxiety,sleep and pain medications without talking with your primary care practitioner  Special Instructions: If you have smoked or chewed Tobacco  in the last 2 yrs please stop smoking, stop any regular Alcohol  and or any Recreational drug use.  Wear Seat belts while driving.  Do not drive if taking any narcotic, mind altering or controlled substances or recreational drugs or alcohol.       

## 2022-10-19 NOTE — Care Management Important Message (Signed)
Important Message  Patient Details  Name: Cheryl Abbott MRN: [id number] Date of Birth: Jan 29, 1946   Medicare Important Message Given:  N/A - LOS <3 / Initial given by admissions     Corey Harold 10/19/2022, 9:50 AM

## 2022-10-19 NOTE — Consult Note (Signed)
   [facility] Inpatient Consult   10/19/2022  AURIA DEMBEK [birth year] [id number]  Primary Care Provider:  Dr. Syliva Overman  Patient is currently active with [facility] [[facility]] Care Management for chronic disease management services.  Patient has been engaged by a [facility].  Our community based plan of care has focused on disease management and community resource support.   Patient will receive a post hospital call and will be evaluated for assessments and disease process education.   Plan: Pt will discharge today and resume [facility].  Inpatient Transition Of Care [TOC] team member to make aware that [facility] Care Management following.  Of note, [facility] Care Management services does not replace or interfere with any services that are needed or arranged by inpatient [facility] care management team.   For additional questions or referrals please contact:  Elliot Cousin, RN, BSN [facility] Liaison [place]   [facility]  Population Health Office Hours MTWF 8:00 am to 6 pm off on Thursday [phone] mobile [phone] [Office toll free line][facility] Office Hours are M-F 8:30 - 5 pm 24 hour nurse advise line [phone] Conceirge  [email or web address]

## 2022-10-19 NOTE — TOC Transition Note (Addendum)
Transition of Care [facility]) - CM/SW Discharge Note   Patient Details  Name: Cheryl Abbott MRN: [id number] Date of Birth: 08-05-1945  Transition of Care [facility]) CM/SW Contact:  Elliot Gault, LCSW Phone Number: 10/19/2022, 10:16 AM   Clinical Narrative:     Pt medically stable for dc today per MD. Plan remains for dc home with resumption of HH from [facility]. Spoke with pt who is aware and in agreement.  Gean Quint at [facility]. They will follow up with pt at home.  No other TOC needs for dc.  Final next level of care: Home w Home Health Services Barriers to Discharge: Barriers Resolved   Patient Goals and CMS Choice CMS Medicare.gov Compare Post Acute Care list provided to:: Patient    Discharge Placement                         Discharge Plan and Services Additional resources added to the After Visit Summary for   In-house Referral: Clinical Social Work   Post Acute Care Choice: Resumption of Svcs/PTA Provider                               Social Determinants of Health (SDOH) Interventions SDOH Screenings   Food Insecurity: No Food Insecurity (10/17/2022)  Housing: Low Risk  (10/17/2022)  Transportation Needs: No Transportation Needs (10/17/2022)  Utilities: Not At Risk (10/17/2022)  Alcohol Screen: Low Risk  (07/06/2022)  Depression (PHQ2-9): Low Risk  (10/11/2022)  Financial Resource Strain: Low Risk  (07/06/2022)  Physical Activity: Inactive (07/06/2022)  Social Connections: Moderately Isolated (07/06/2022)  Stress: No Stress Concern Present (07/06/2022)  Tobacco Use: Medium Risk (10/17/2022)     Readmission Risk Interventions     No data to display

## 2022-10-19 NOTE — Discharge Summary (Addendum)
Physician Discharge Summary  Cheryl Abbott:[id number] DOB: 1945-11-04 DOA: 10/17/2022  PCP: Cheryl Perches, MD Urologist: Dr. Ronne Binning  Admit date: 10/17/2022 Discharge date: 10/19/2022  Admitted From:  Home with HH  Disposition: Home with [facility]   Recommendations for Outpatient Follow-up:  Follow up with PCP in 1 weeks Follow up with scheduled procedure with urology on 10/25/22 as already arranged  Home Health:  PT/OT   Discharge Condition: STABLE   CODE STATUS: FULL DIET: resume prior home diet    Brief Hospitalization Summary: Please see all hospital notes, images, labs for full details of the hospitalization. ADMISSION HPI:    77 y.o. female, with a past medical history of stage III CKD, chronic shoulder pain, chronic back pain, osteoarthritis, anxiety disorder, hypertension, hyperlipidemia, type 2 diabetes, nephrolithiasis and UTIs with recent hospitalization due to complicated UTI from right hydronephrosis, status post double-J stent placement. -Patient presents to ED today secondary to flank pain, she reports recent right stent placement, with no complaints, but reports this morning when she woke up from sleep, she has been complaining of left flank pain, radiating to left abdomen and groin, as well she does report hematuria, she denies nausea, vomiting and diarrhea, no fever or chills -In ED her workup was significant for hematuria, pyuria, as well CT renal protocol has been obtained which was significant for new left ureteral hydronephrosis with kidney stone, so she was started on IV antibiotics, and [facility] hospitalist consulted to admit.    HOSPITAL COURSE BY PROBLEM   Complicated UTI with new left ureteral hydronephrosis -With recent hospitalization for complicated UTI with right hydronephrosis, status post JJ stent placement. -Appears to be with new left-sided hydronephrosis -Follow urine culture -continue IV ceftriaxone -Urology consulted Dr. Annabell Howells saw 5/9 -outpatient  plan for stent placement 5/16 already arranged prior to arrival -Dr. Annabell Howells recommended pt keep appt for planned procedure on 5/16, nothing needed inpatient at this time -pt is feeling better, will discharge home today. Outpatient follow up as arranged.   -no sepsis physiology, sepsis ruled out  -Dr. Annabell Howells said for patient to continue ciprofloxacin that her been previously ordered for her     CKD stage IIIb Renal function at baseline, continue to monitor outpatient, avoid nephrotoxic medications, treated with gentle hydration   Diabetes mellitus type 2 with nephropathy -On insulin sliding scale during hospital stay, will hold Trulicity and Tradjenta  CBG (last 3)  Recent Labs (last 2 labs)       Recent Labs    10/17/22 2121 10/18/22 0749 10/18/22 1128  GLUCAP 101* 86 85      Hyperlipidemia -Continue with home statin   Hypertension -Continue with Norvasc and Imdur   Anxiety and depression -Continue with Paxil    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 Annabell Howells  Procedures:    Antimicrobials:  Ceftriaxone IV 1 gm daily in hospital   Discharge Diagnoses:  Principal Problem:   Complicated UTI (urinary tract infection) Active Problems:   Anxiety and depression   CKD (chronic kidney disease) stage 3, GFR 30-59 ml/min (HCC)   Hydroureteronephrosis   Discharge Instructions:  Allergies as  of 10/19/2022       Reactions   Ace Inhibitors Cough   Patient doesn't recall Other Reaction(s): GI Intolerance   Cephalexin Diarrhea, Nausea And Vomiting   Other Reaction(s): GI Intolerance    Nitrofurantoin Diarrhea, Nausea And Vomiting   Penicillins Hives, Other (See Comments)        Medication List     STOP taking these medications    cefdinir 300 MG capsule Commonly known as: OMNICEF   FreeStyle Libre 2 Reader [employer] 2 Sensor Misc   isosorbide mononitrate 30 MG 24 hr tablet Commonly known as: IMDUR       TAKE these medications    acetaminophen 500 MG tablet Commonly known as: TYLENOL Take 2 tablets (1,000 mg total) by mouth at bedtime.   amLODipine 10 MG tablet Commonly known as: NORVASC TAKE 1 TABLET BY MOUTH EVERY DAY   aspirin EC 81 MG tablet Take 81 mg by mouth daily. Swallow whole.   ciprofloxacin 500 MG tablet Commonly known as: Cipro Take 1 tablet (500 mg total) by mouth 2 (two) times daily for 14 days.   cyanocobalamin 500 MCG tablet Commonly known as: VITAMIN B12 Take 1 tablet (500 mcg total) by mouth daily.   diclofenac Sodium 1 % Gel Commonly known as: VOLTAREN APPLY 4 G TOPICALLY 4 (FOUR) TIMES DAILY AS NEEDED (PAIN).   diphenhydramine-acetaminophen 25-500 MG Tabs tablet Commonly known as: TYLENOL PM Take 2 tablets by mouth at bedtime.   folic acid 1 MG tablet Commonly known as: FOLVITE Take 1 tablet (1 mg total) by mouth daily.   gabapentin 300 MG capsule Commonly known as: NEURONTIN Take 1 capsule (300 mg total) by mouth 3 (three) times daily as needed (pain).   nitroGLYCERIN 0.4 MG SL tablet Commonly known as: NITROSTAT Place 1 tablet (0.4 mg total) under the tongue every 5 (five) minutes as needed for chest pain. Up to 3 tablets, then call 911.   OneTouch Delica Lancets 30G Misc Once daily testing dx e11.9   OneTouch Ultra test strip Generic drug: glucose blood USE AS DIRECTED   pantoprazole 40 MG tablet Commonly known as: PROTONIX TAKE 1 TABLET BY MOUTH TWICE A DAY   PARoxetine 40 MG tablet Commonly known as: PAXIL TAKE 1 TABLET (40 MG TOTAL) BY MOUTH IN THE MORNING   rosuvastatin 20 MG  tablet Commonly known as: CRESTOR Take 20 mg by mouth daily. What changed: Another medication with the same name was removed. Continue taking this medication, and follow the directions you see here.   Symbicort 80-4.5 MCG/ACT inhaler Generic drug: budesonide-formoterol INHALE 2 PUFFS INTO THE LUNGS TWICE A DAY What changed: See the new instructions.   Tradjenta 5 MG Tabs tablet Generic drug: linagliptin TAKE 1 TABLET BY MOUTH EVERY DAY   traMADol 50 MG tablet Commonly known as: ULTRAM TAKE 1 TABLET BY MOUTH EVERY 6 HOURS AS NEEDED.   Trulicity 0.75 MG/0.5ML Sopn Generic drug: Dulaglutide INJECT 0.75 MG INTO THE SKIN WEEKLY What changed: See the new instructions.   UNABLE TO FIND Diabetic shoes x 1 pair, inserts x 3 pair  DX E11.9        Follow-up Information     Cheryl Perches, MD. Schedule an appointment as soon as possible for a visit in 1 week(s).   Specialty: Family Medicine Why: Hospital Follow Up Contact information: [address], [address] [place] [address] [phone]         Malen Gauze, MD.  Go on 10/25/2022.   Specialty: Urology Why: as scheduled for procedure Contact information: [address]  [address] [place] [address] [phone]                Allergies  Allergen Reactions   Ace Inhibitors Cough    Patient doesn't recall  Other Reaction(s): GI Intolerance   Cephalexin Diarrhea and Nausea And Vomiting    Other Reaction(s): GI Intolerance   Nitrofurantoin Diarrhea and Nausea And Vomiting   Penicillins Hives and Other (See Comments)   Allergies as of 10/19/2022       Reactions   Ace Inhibitors Cough   Patient doesn't recall Other Reaction(s): GI Intolerance   Cephalexin Diarrhea, Nausea And Vomiting   Other Reaction(s): GI Intolerance   Nitrofurantoin Diarrhea, Nausea And Vomiting   Penicillins Hives, Other (See Comments)        Medication List     STOP taking these medications     cefdinir 300 MG capsule Commonly known as: OMNICEF   FreeStyle Libre 2 Reader [employer] 2 Sensor Misc   isosorbide mononitrate 30 MG 24 hr tablet Commonly known as: IMDUR       TAKE these medications    acetaminophen 500 MG tablet Commonly known as: TYLENOL Take 2 tablets (1,000 mg total) by mouth at bedtime.   amLODipine 10 MG tablet Commonly known as: NORVASC TAKE 1 TABLET BY MOUTH EVERY DAY   aspirin EC 81 MG tablet Take 81 mg by mouth daily. Swallow whole.   ciprofloxacin 500 MG tablet Commonly known as: Cipro Take 1 tablet (500 mg total) by mouth 2 (two) times daily for 14 days.   cyanocobalamin 500 MCG tablet Commonly known as: VITAMIN B12 Take 1 tablet (500 mcg total) by mouth daily.   diclofenac Sodium 1 % Gel Commonly known as: VOLTAREN APPLY 4 G TOPICALLY 4 (FOUR) TIMES DAILY AS NEEDED (PAIN).   diphenhydramine-acetaminophen 25-500 MG Tabs tablet Commonly known as: TYLENOL PM Take 2 tablets by mouth at bedtime.   folic acid 1 MG tablet Commonly known as: FOLVITE Take 1 tablet (1 mg total) by mouth daily.   gabapentin 300 MG capsule Commonly known as: NEURONTIN Take 1 capsule (300 mg total) by mouth 3 (three) times daily as needed (pain).   nitroGLYCERIN 0.4 MG SL tablet Commonly known as: NITROSTAT Place 1 tablet (0.4 mg total) under the tongue every 5 (five) minutes as needed for chest pain. Up to 3 tablets, then call 911.   OneTouch Delica Lancets 30G Misc Once daily testing dx e11.9   OneTouch Ultra test strip Generic drug: glucose blood USE AS DIRECTED   pantoprazole 40 MG tablet Commonly known as: PROTONIX TAKE 1 TABLET BY MOUTH TWICE A DAY   PARoxetine 40 MG tablet Commonly known as: PAXIL TAKE 1 TABLET (40 MG TOTAL) BY MOUTH IN THE MORNING   rosuvastatin 20 MG tablet Commonly known as: CRESTOR Take 20 mg by mouth daily. What changed: Another medication with the same name was removed. Continue taking this  medication, and follow the directions you see here.   Symbicort 80-4.5 MCG/ACT inhaler Generic drug: budesonide-formoterol INHALE 2 PUFFS INTO THE LUNGS TWICE A DAY What changed: See the new instructions.   Tradjenta 5 MG Tabs tablet Generic drug: linagliptin TAKE 1 TABLET BY MOUTH EVERY DAY   traMADol 50 MG tablet Commonly known as: ULTRAM TAKE 1 TABLET BY MOUTH EVERY 6 HOURS AS NEEDED.   Trulicity 0.75 MG/0.5ML Sopn Generic drug:  Dulaglutide INJECT 0.75 MG INTO THE SKIN WEEKLY What changed: See the new instructions.   UNABLE TO FIND Diabetic shoes x 1 pair, inserts x 3 pair  DX E11.9        Procedures/Studies: CT RENAL STONE STUDY  Result Date: 10/17/2022 CLINICAL DATA:  Left lower quadrant abdominal/flank pain. Recent urinary tract infection. EXAM: CT ABDOMEN AND PELVIS WITHOUT CONTRAST TECHNIQUE: Multidetector CT imaging of the abdomen and pelvis was performed following the standard protocol without IV contrast. RADIATION DOSE REDUCTION: This exam was performed according to the departmental dose-optimization program which includes automated exposure control, adjustment of the mA and/or kV according to patient size and/or use of iterative reconstruction technique. COMPARISON:  CT 09/23/2022, 02/13/2022 FINDINGS: Lower chest: Bandlike subsegmental atelectasis in the right middle and lower lobe related to elevated hemidiaphragm. 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 No acute airspace disease. Hepatobiliary: Unremarkable unenhanced appearance of the liver. Gallbladder physiologically distended, no calcified stone. No biliary dilatation. Pancreas: No ductal dilatation or inflammation. Spleen: Normal in size without focal abnormality. Adrenals/Urinary Tract: No adrenal nodule. Placement of right nephroureteral stent since prior exam. Proximal pigtails coiled in the dilated renal pelvis, the distal pigtail is coiled in the bladder. 3 mm stone fragment adjacent to the distal  stent series 2, image 72. There is diminished right hydronephrosis from prior exam with persistent dilatation of the renal pelvis and ureter. Mild background thinning of right renal parenchyma. There is no evidence of renal fluid collection. Hypodense right renal lesion on prior exam is not well seen in the absence of IV contrast. Progressive left hydroureteronephrosis from prior. There is a punctate stone in the distal left ureter, series 5, image 77 causing proximal obstruction. Multiple additional punctate nonobstructing left intrarenal calculi. There is mild thinning of the left renal parenchyma. Stomach/Bowel: Unremarkable appearance of the stomach. Small duodenal diverticulum. There is no bowel obstruction or inflammation. Normal appendix. Left colonic diverticulosis, most prominent in the sigmoid. No diverticulitis. Vascular/Lymphatic: Mild aortic atherosclerosis. No aneurysm. No enlarged lymph nodes in the abdomen or pelvis. Reproductive: Status post hysterectomy. No adnexal masses. Other: No free air or free fluid. Scattered subcutaneous densities in the lower anterior abdominal wall are typical of medication injection sites. Tiny fat containing umbilical hernia. There is minimal fat in both inguinal canals. Musculoskeletal: Lumbar facet hypertrophy. There are no acute or suspicious osseous abnormalities. Left gluteal subcutaneous granulomas. IMPRESSION: 1. Punctate distal left ureteric stone causing moderate proximal hydroureteronephrosis. Increased hydronephrosis from prior exam. Multiple additional punctate nonobstructing left intrarenal calculi. 2. Placement of right nephroureteral stent since prior exam. Diminished right hydronephrosis with persistent renal pelvis dilatation. 3 mm stone is seen adjacent to the distal portion of the stent. 3. Colonic diverticulosis without diverticulitis. Aortic Atherosclerosis (ICD10-I70.0). Electronically Signed   By: Narda Rutherford M.D.   On: 10/17/2022 15:06    DG C-Arm 1-60 Min-No Report  Result Date: 09/24/2022 Fluoroscopy was utilized by the requesting physician.  No radiographic interpretation.   CT L-SPINE NO CHARGE  Result Date: 09/23/2022 CLINICAL DATA:  Fall, back pain EXAM: CT LUMBAR SPINE WITHOUT CONTRAST TECHNIQUE: Multidetector CT imaging of the lumbar spine was performed without intravenous contrast administration. Multiplanar CT image reconstructions were also generated. RADIATION DOSE REDUCTION: This exam was performed according to the departmental dose-optimization program which includes automated exposure control, adjustment of the mA and/or kV according to patient size and/or use of iterative reconstruction technique. COMPARISON:  02/13/2022 FINDINGS: Segmentation: 5 lumbar type vertebrae. Alignment: Normal. Vertebrae: No  acute fracture or focal pathologic process. Paraspinal and other soft tissues: See dedicated abdominopelvic CT report for assessment of the intra-abdominal findings. No paravertebral inflammatory changes or fluid collections. Disc levels: Intervertebral disc heights are preserved. Moderate bilateral facet arthropathy most pronounced at L3-L4 and L4-L5. Foraminal narrowing appears most pronounced at L4-L5 on the right. No evidence to suggest high-grade canal stenosis by CT. IMPRESSION: 1. No acute fracture or traumatic malalignment of the lumbar spine. 2. Moderate bilateral facet arthropathy most pronounced at L3-L4 and L4-L5. Foraminal narrowing appears most pronounced at L4-L5 on the right. Electronically Signed   By: Duanne Guess D.O.   On: 09/23/2022 13:52   CT ABDOMEN PELVIS W CONTRAST  Result Date: 09/23/2022 CLINICAL DATA:  Weakness, pyelonephritis. EXAM: CT ABDOMEN AND PELVIS WITH CONTRAST TECHNIQUE: Multidetector CT imaging of the abdomen and pelvis was performed using the standard protocol following bolus administration of intravenous contrast. RADIATION DOSE REDUCTION: This exam was performed according to the  departmental dose-optimization program which includes automated exposure control, adjustment of the mA and/or kV according to patient size and/or use of iterative reconstruction technique. CONTRAST:  80mL OMNIPAQUE IOHEXOL 300 MG/ML  SOLN COMPARISON:  February 13, 2022. FINDINGS: Lower chest: Minimal bibasilar subsegmental atelectasis. Hepatobiliary: No focal liver abnormality is seen. No gallstones, gallbladder wall thickening, or biliary dilatation. Pancreas: Unremarkable. No pancreatic ductal dilatation or surrounding inflammatory changes. Spleen: Normal in size without focal abnormality. Adrenals/Urinary Tract: Adrenal glands appear normal. Mild nonobstructive left nephrolithiasis. Minimal left hydroureteronephrosis is noted without obstructing calculus. Moderate to severe right hydroureteronephrosis is noted secondary to 6 mm calculus in midportion of right ureter. Urinary bladder is unremarkable. Stomach/Bowel: Stomach is within normal limits. Appendix appears normal. No evidence of bowel wall thickening, distention, or inflammatory changes. Diverticulosis of descending and sigmoid colon is noted without inflammation. Vascular/Lymphatic: Aortic atherosclerosis. No enlarged abdominal or pelvic lymph nodes. Reproductive: Status post hysterectomy. No adnexal masses. Other: No abdominal wall hernia or abnormality. No abdominopelvic ascites. Musculoskeletal: No acute or significant osseous findings. IMPRESSION: Moderate to severe right hydroureteronephrosis is noted secondary to 6 mm calculus in midportion of right ureter. Nonobstructive left nephrolithiasis. Minimal left hydronephrosis is noted without obstructing calculus. Diverticulosis of descending and sigmoid colon without inflammation. Aortic Atherosclerosis (ICD10-I70.0). Electronically Signed   By: Lupita Raider M.D.   On: 09/23/2022 13:50   DG Chest Portable 1 View  Result Date: 09/23/2022 CLINICAL DATA:  Altered mental status, generalized weakness.  EXAM: PORTABLE CHEST 1 VIEW COMPARISON:  Chest x-rays dated 03/21/2022 and 06/07/2021. FINDINGS: Heart size and mediastinal contours are stable. Lungs are clear. No pleural effusion or pneumothorax is seen. Chronic elevation of the RIGHT hemidiaphragm. Osseous structures about the chest are unremarkable. IMPRESSION: No active disease. No evidence of pneumonia or pulmonary edema. Electronically Signed   By: Bary Richard M.D.   On: 09/23/2022 09:09   CT Head Wo Contrast  Result Date: 09/23/2022 CLINICAL DATA:  Mental status change with unknown cause. EXAM: CT HEAD WITHOUT CONTRAST TECHNIQUE: Contiguous axial images were obtained from the base of the skull through the vertex without intravenous contrast. RADIATION DOSE REDUCTION: This exam was performed according to the departmental dose-optimization program which includes automated exposure control, adjustment of the mA and/or kV according to patient size and/or use of iterative reconstruction technique. COMPARISON:  03/07/2016 FINDINGS: Brain: No evidence of acute infarction, hemorrhage, hydrocephalus, extra-axial collection or mass lesion/mass effect. Generalized cerebral volume loss with mild chronic small vessel ischemia. Vascular: No hyperdense vessel or unexpected  calcification. Skull: Normal. Negative for fracture or focal lesion. Sinuses/Orbits: Negative IMPRESSION: No acute finding.  Stable head CT when compared to 2017. Electronically Signed   By: Tiburcio Pea M.D.   On: 09/23/2022 09:05     Subjective: Pt says she is feeling anxious and really wants to go home.  She is agreeable to going to scheduled ureteral stent placement on 10/25/22 as already scheduled and appreciated speaking with Dr. Annabell Howells.    Discharge Exam: Vitals:   10/19/22 0311 10/19/22 0835  BP: 134/72   Pulse: (!) 110   Resp: 20   Temp: 98.7 F (37.1 C)   SpO2: 95% 90%   Vitals:   10/18/22 2014 10/18/22 2021 10/19/22 0311 10/19/22 0835  BP:  127/73 134/72   Pulse:  (!)  103 (!) 110   Resp:  18 20   Temp:  98.2 F (36.8 C) 98.7 F (37.1 C)   TempSrc:      SpO2: 90% 93% 95% 90%  Weight:      Height:       General: Pt is alert, awake, not in acute distress Cardiovascular: normal S1/S2 +, no rubs, no gallops Respiratory: CTA bilaterally, no wheezing, no rhonchi Abdominal: Soft, NT, ND, bowel sounds + Extremities: no edema, no cyanosis   The results of significant diagnostics from this hospitalization (including imaging, microbiology, ancillary and laboratory) are listed below for reference.     Microbiology: Recent Results (from the past 240 hour(s))  Urine Culture     Status: Abnormal   Collection Time: 10/12/22 11:02 AM   Specimen: Urine   UR  Result Value Ref Range Status   Urine Culture, Routine Final report (A)  Final   Organism ID, Bacteria Enterococcus faecalis (A)  Final    Comment: For Enterococcus species, aminoglycosides (except for high-level resistance screening), cephalosporins, clindamycin, and trimethoprim-sulfamethoxazole are not effective clinically. (CLSI, M100-S26, 2016) Greater than 100,000 colony forming units per mL    Antimicrobial Susceptibility Comment  Final    Comment:       ** S = Susceptible; I = Intermediate; R = Resistant **                    P = Positive; N = Negative             MICS are expressed in micrograms per mL    Antibiotic                 RSLT#1    RSLT#2    RSLT#3    RSLT#4 Ciprofloxacin                  S Levofloxacin                   S Nitrofurantoin                 S Penicillin                     S Tetracycline                   R Vancomycin                     S   Urine Culture     Status: None   Collection Time: 10/17/22  4:58 PM   Specimen: Urine, Clean Catch  Result Value Ref Range Status   Specimen Description   Final  URINE, CLEAN CATCH Performed at [facility], [address], [place], [place] [address]    Special Requests   Final    NONE Performed at [facility],  [address] [place], [place] [address]    Culture   Final    NO GROWTH Performed at [facility], [address] [place], [place] [address]    Report Status 10/18/2022 FINAL  Final     Labs: BNP (last 3 results) No results for input(s): "BNP" in the last 8760 hours. Basic Metabolic Panel: Recent Labs  Lab 10/17/22 1334 10/18/22 0505 10/19/22 0409  NA 132* 136 137  K 3.6 4.2 3.9  CL 101 105 105  CO2 22 24 24   GLUCOSE 123* 94 105*  BUN 18 16 13   CREATININE 1.60* 1.59* 1.44*  CALCIUM 9.4 9.6 9.8  MG  --   --  2.0   Liver Function Tests: Recent Labs  Lab 10/17/22 1334  AST 15  ALT 14  ALKPHOS 77  BILITOT 0.3  PROT 8.4*  ALBUMIN 3.6   Recent Labs  Lab 10/17/22 1334  LIPASE 39   No results for input(s): "AMMONIA" in the last 168 hours. CBC: Recent Labs  Lab 10/17/22 1334 10/18/22 0505 10/19/22 0409  WBC 12.5* 10.2 7.6  NEUTROABS 9.3*  --   --   HGB 11.1* 10.2* 10.3*  HCT 35.1* 32.9* 32.6*  MCV 94.9 95.1 93.9  PLT 263 250 247   Cardiac Enzymes: No results for input(s): "CKTOTAL", "CKMB", "CKMBINDEX", "TROPONINI" in the last 168 hours. BNP: Invalid input(s): "POCBNP" CBG: Recent Labs  Lab 10/18/22 0749 10/18/22 1128 10/18/22 1653 10/18/22 2019 10/19/22 0716  GLUCAP 86 85 85 129* 100*   D-Dimer No results for input(s): "DDIMER" in the last 72 hours. Hgb A1c No results for input(s): "HGBA1C" in the last 72 hours. Lipid Profile No results for input(s): "CHOL", "HDL", "LDLCALC", "TRIG", "CHOLHDL", "LDLDIRECT" in the last 72 hours. Thyroid function studies No results for input(s): "TSH", "T4TOTAL", "T3FREE", "THYROIDAB" in the last 72 hours.  Invalid input(s): "FREET3" Anemia work up No results for input(s): "VITAMINB12", "FOLATE", "FERRITIN", "TIBC", "IRON", "RETICCTPCT" in the last 72 hours. Urinalysis    Component Value Date/Time   COLORURINE YELLOW 10/17/2022 1658   APPEARANCEUR CLOUDY (A) 10/17/2022 1658   APPEARANCEUR Clear  01/11/2022 1408   LABSPEC 1.012 10/17/2022 1658   PHURINE 7.0 10/17/2022 1658   GLUCOSEU NEGATIVE 10/17/2022 1658   HGBUR LARGE (A) 10/17/2022 1658   HGBUR moderate 02/07/2010 0907   BILIRUBINUR NEGATIVE 10/17/2022 1658   BILIRUBINUR negative 10/12/2022 0953   BILIRUBINUR Negative 01/11/2022 1408   KETONESUR NEGATIVE 10/17/2022 1658   PROTEINUR 100 (A) 10/17/2022 1658   UROBILINOGEN 0.2 10/12/2022 0953   UROBILINOGEN 0.2 03/01/2015 1600   NITRITE NEGATIVE 10/17/2022 1658   LEUKOCYTESUR LARGE (A) 10/17/2022 1658   Sepsis Labs Recent Labs  Lab 10/17/22 1334 10/18/22 0505 10/19/22 0409  WBC 12.5* 10.2 7.6   Microbiology Recent Results (from the past 240 hour(s))  Urine Culture     Status: Abnormal   Collection Time: 10/12/22 11:02 AM   Specimen: Urine   UR  Result Value Ref Range Status   Urine Culture, Routine Final report (A)  Final   Organism ID, Bacteria Enterococcus faecalis (A)  Final    Comment: For Enterococcus species, aminoglycosides (except for high-level resistance screening), cephalosporins, clindamycin, and trimethoprim-sulfamethoxazole are not effective clinically. (CLSI, M100-S26, 2016) Greater than 100,000 colony forming units per mL  Antimicrobial Susceptibility Comment  Final    Comment:       ** S = Susceptible; I = Intermediate; R = Resistant **                    P = Positive; N = Negative             MICS are expressed in micrograms per mL    Antibiotic                 RSLT#1    RSLT#2    RSLT#3    RSLT#4 Ciprofloxacin                  S Levofloxacin                   S Nitrofurantoin                 S Penicillin                     S Tetracycline                   R Vancomycin                     S   Urine Culture     Status: None   Collection Time: 10/17/22  4:58 PM   Specimen: Urine, Clean Catch  Result Value Ref Range Status   Specimen Description   Final    URINE, CLEAN CATCH Performed at [facility], [address]  [place], [place] [address]    Special Requests   Final    NONE Performed at [facility], [address] [place], [place] [address]    Culture   Final    NO GROWTH Performed at [facility], [address], [place], [place] [address]    Report Status 10/18/2022 FINAL  Final    Time coordinating discharge: 37 mins  SIGNED:  Standley Dakins, MD  [facility] 10/19/2022, 10:10 AM How to contact the [facility] Attending or Consulting provider 7A - 7P or covering provider during after hours 7P -7A, for this patient?  Check the care team in [facility] and look for a) attending/consulting [facility] provider listed and b) the [facility] team listed Log into [email or web address] and use 's universal password to access. If you do not have the password, please contact the hospital operator. Locate the [facility] provider you are looking for under [facility] and page to a number that you can be directly reached. If you still have difficulty reaching the provider, please page the [facility] (Director on Call) for the Hospitalists listed on amion for assistance.

## 2022-10-22 ENCOUNTER — Encounter (HOSPITAL_COMMUNITY)
Admission: RE | Admit: 2022-10-22 | Discharge: 2022-10-22 | Disposition: A | Discharge status: Home / Self Care | Payer: 59 | Source: Ambulatory Visit | Attending: Urology | Admitting: Urology

## 2022-10-22 ENCOUNTER — Telehealth: Payer: Self-pay

## 2022-10-22 NOTE — Pre-Procedure Instructions (Signed)
Attempted pre-op phone call. Call states that the call could not be completed at this time, try again later. Could not leave a VM.

## 2022-10-22 NOTE — Transitions of Care (Post Inpatient/ED Visit) (Signed)
   10/22/2022  Name: Cheryl Abbott MRN: [id number] DOB: [birth year]  Today's TOC FU Call Status: Today's TOC FU Call Status:: Unsuccessul Call (1st Attempt) Unsuccessful Call (1st Attempt) Date: 10/22/22  Attempted to reach the patient regarding the most recent Inpatient/ED visit.  Follow Up Plan: Additional outreach attempts will be made to reach the patient to complete the Transitions of Care (Post Inpatient/ED visit) call.   Jodelle Gross, RN, BSN, CCM Care Management Coordinator [facility]

## 2022-10-23 ENCOUNTER — Telehealth: Payer: Self-pay

## 2022-10-23 ENCOUNTER — Other Ambulatory Visit: Payer: Self-pay | Admitting: Orthopedic Surgery

## 2022-10-23 DIAGNOSIS — E1122 Type 2 diabetes mellitus with diabetic chronic kidney disease: Secondary | ICD-10-CM | POA: Diagnosis not present

## 2022-10-23 DIAGNOSIS — Z7985 Long-term (current) use of injectable non-insulin antidiabetic drugs: Secondary | ICD-10-CM | POA: Diagnosis not present

## 2022-10-23 DIAGNOSIS — M25512 Pain in left shoulder: Secondary | ICD-10-CM

## 2022-10-23 DIAGNOSIS — I129 Hypertensive chronic kidney disease with stage 1 through stage 4 chronic kidney disease, or unspecified chronic kidney disease: Secondary | ICD-10-CM | POA: Diagnosis not present

## 2022-10-23 DIAGNOSIS — Z7982 Long term (current) use of aspirin: Secondary | ICD-10-CM | POA: Diagnosis not present

## 2022-10-23 DIAGNOSIS — M5116 Intervertebral disc disorders with radiculopathy, lumbar region: Secondary | ICD-10-CM | POA: Diagnosis not present

## 2022-10-23 DIAGNOSIS — K5730 Diverticulosis of large intestine without perforation or abscess without bleeding: Secondary | ICD-10-CM | POA: Diagnosis not present

## 2022-10-23 DIAGNOSIS — N1832 Chronic kidney disease, stage 3b: Secondary | ICD-10-CM | POA: Diagnosis not present

## 2022-10-23 DIAGNOSIS — E1142 Type 2 diabetes mellitus with diabetic polyneuropathy: Secondary | ICD-10-CM | POA: Diagnosis not present

## 2022-10-23 DIAGNOSIS — E1151 Type 2 diabetes mellitus with diabetic peripheral angiopathy without gangrene: Secondary | ICD-10-CM | POA: Diagnosis not present

## 2022-10-23 DIAGNOSIS — N136 Pyonephrosis: Secondary | ICD-10-CM | POA: Diagnosis not present

## 2022-10-23 DIAGNOSIS — E611 Iron deficiency: Secondary | ICD-10-CM | POA: Diagnosis not present

## 2022-10-23 DIAGNOSIS — M5430 Sciatica, unspecified side: Secondary | ICD-10-CM | POA: Diagnosis not present

## 2022-10-23 DIAGNOSIS — F32A Depression, unspecified: Secondary | ICD-10-CM | POA: Diagnosis not present

## 2022-10-23 DIAGNOSIS — C9000 Multiple myeloma not having achieved remission: Secondary | ICD-10-CM | POA: Diagnosis not present

## 2022-10-23 DIAGNOSIS — M1990 Unspecified osteoarthritis, unspecified site: Secondary | ICD-10-CM | POA: Diagnosis not present

## 2022-10-23 DIAGNOSIS — A419 Sepsis, unspecified organism: Secondary | ICD-10-CM | POA: Diagnosis not present

## 2022-10-23 DIAGNOSIS — M25511 Pain in right shoulder: Secondary | ICD-10-CM | POA: Diagnosis not present

## 2022-10-23 DIAGNOSIS — G8929 Other chronic pain: Secondary | ICD-10-CM | POA: Diagnosis not present

## 2022-10-23 DIAGNOSIS — E785 Hyperlipidemia, unspecified: Secondary | ICD-10-CM | POA: Diagnosis not present

## 2022-10-23 DIAGNOSIS — Z79891 Long term (current) use of opiate analgesic: Secondary | ICD-10-CM | POA: Diagnosis not present

## 2022-10-23 DIAGNOSIS — G4733 Obstructive sleep apnea (adult) (pediatric): Secondary | ICD-10-CM | POA: Diagnosis not present

## 2022-10-23 DIAGNOSIS — K219 Gastro-esophageal reflux disease without esophagitis: Secondary | ICD-10-CM | POA: Diagnosis not present

## 2022-10-23 NOTE — Transitions of Care (Post Inpatient/ED Visit) (Signed)
   10/23/2022  Name: LAKIYAH KOLINSKY MRN: [id number] DOB: Jan 01, 1946  Today's TOC FU Call Status: Today's TOC FU Call Status:: Unsuccessful Call (2nd Attempt) Unsuccessful Call (2nd Attempt) Date: 10/23/22  Attempted to reach the patient regarding the most recent Inpatient/ED visit.  Follow Up Plan: Additional outreach attempts will be made to reach the patient to complete the Transitions of Care (Post Inpatient/ED visit) call.   Jodelle Gross, RN, BSN, CCM Care Management Coordinator [place] Phone: [phone]/Fax: [phone]

## 2022-10-23 NOTE — Pre-Procedure Instructions (Signed)
Message Kourtney at Dr Macario Carls office to let her know we have been unable to contact patient.

## 2022-10-23 NOTE — Pre-Procedure Instructions (Signed)
Attempted pre-op phone call again. Once again states that call cannot be completed at this time and to try again later. Could not leave VM.

## 2022-10-23 NOTE — Telephone Encounter (Signed)
I called patient to inform her that [facility] pre op had been trying to contact her for her upcoming surgery.  Patient aware and provided alternate contact number.  [phone]

## 2022-10-24 ENCOUNTER — Other Ambulatory Visit: Payer: Self-pay

## 2022-10-24 ENCOUNTER — Encounter (HOSPITAL_COMMUNITY): Payer: Self-pay

## 2022-10-24 ENCOUNTER — Telehealth: Payer: Self-pay

## 2022-10-24 DIAGNOSIS — E1142 Type 2 diabetes mellitus with diabetic polyneuropathy: Secondary | ICD-10-CM | POA: Diagnosis not present

## 2022-10-24 DIAGNOSIS — I129 Hypertensive chronic kidney disease with stage 1 through stage 4 chronic kidney disease, or unspecified chronic kidney disease: Secondary | ICD-10-CM | POA: Diagnosis not present

## 2022-10-24 DIAGNOSIS — Z7982 Long term (current) use of aspirin: Secondary | ICD-10-CM | POA: Diagnosis not present

## 2022-10-24 DIAGNOSIS — N1832 Chronic kidney disease, stage 3b: Secondary | ICD-10-CM | POA: Diagnosis not present

## 2022-10-24 DIAGNOSIS — F32A Depression, unspecified: Secondary | ICD-10-CM | POA: Diagnosis not present

## 2022-10-24 DIAGNOSIS — C9000 Multiple myeloma not having achieved remission: Secondary | ICD-10-CM | POA: Diagnosis not present

## 2022-10-24 DIAGNOSIS — M5430 Sciatica, unspecified side: Secondary | ICD-10-CM | POA: Diagnosis not present

## 2022-10-24 DIAGNOSIS — E785 Hyperlipidemia, unspecified: Secondary | ICD-10-CM | POA: Diagnosis not present

## 2022-10-24 DIAGNOSIS — G4733 Obstructive sleep apnea (adult) (pediatric): Secondary | ICD-10-CM | POA: Diagnosis not present

## 2022-10-24 DIAGNOSIS — N136 Pyonephrosis: Secondary | ICD-10-CM | POA: Diagnosis not present

## 2022-10-24 DIAGNOSIS — Z7985 Long-term (current) use of injectable non-insulin antidiabetic drugs: Secondary | ICD-10-CM | POA: Diagnosis not present

## 2022-10-24 DIAGNOSIS — Z79891 Long term (current) use of opiate analgesic: Secondary | ICD-10-CM | POA: Diagnosis not present

## 2022-10-24 DIAGNOSIS — M5116 Intervertebral disc disorders with radiculopathy, lumbar region: Secondary | ICD-10-CM | POA: Diagnosis not present

## 2022-10-24 DIAGNOSIS — E1122 Type 2 diabetes mellitus with diabetic chronic kidney disease: Secondary | ICD-10-CM | POA: Diagnosis not present

## 2022-10-24 DIAGNOSIS — G8929 Other chronic pain: Secondary | ICD-10-CM | POA: Diagnosis not present

## 2022-10-24 DIAGNOSIS — K5730 Diverticulosis of large intestine without perforation or abscess without bleeding: Secondary | ICD-10-CM | POA: Diagnosis not present

## 2022-10-24 DIAGNOSIS — A419 Sepsis, unspecified organism: Secondary | ICD-10-CM | POA: Diagnosis not present

## 2022-10-24 DIAGNOSIS — M25511 Pain in right shoulder: Secondary | ICD-10-CM | POA: Diagnosis not present

## 2022-10-24 DIAGNOSIS — K219 Gastro-esophageal reflux disease without esophagitis: Secondary | ICD-10-CM | POA: Diagnosis not present

## 2022-10-24 DIAGNOSIS — E611 Iron deficiency: Secondary | ICD-10-CM | POA: Diagnosis not present

## 2022-10-24 DIAGNOSIS — E1151 Type 2 diabetes mellitus with diabetic peripheral angiopathy without gangrene: Secondary | ICD-10-CM | POA: Diagnosis not present

## 2022-10-24 DIAGNOSIS — M25512 Pain in left shoulder: Secondary | ICD-10-CM | POA: Diagnosis not present

## 2022-10-24 DIAGNOSIS — M1990 Unspecified osteoarthritis, unspecified site: Secondary | ICD-10-CM | POA: Diagnosis not present

## 2022-10-24 NOTE — Transitions of Care (Post Inpatient/ED Visit) (Signed)
10/24/2022  Name: Cheryl Abbott MRN: [id number] DOB: 1945/09/10  Today's TOC FU Call Status: Today's TOC FU Call Status:: Successful TOC FU Call Competed TOC FU Call Complete Date: 10/24/22  Transition Care Management Follow-up Telephone Call Date of Discharge: 10/19/22 [facility] Type of Discharge: Inpatient Admission Primary Inpatient Discharge Diagnosis:: Complicated Urinary Tract Infection How have you been since you were released from the hospital?: Better (Patient notes she is tired and achey but somewhat better) Any questions or concerns?: No  Items Reviewed: Did you receive and understand the discharge instructions provided?: Yes Medications obtained,verified, and reconciled?: Yes (Medications Reviewed) Any new allergies since your discharge?: No Dietary orders reviewed?: No Do you have support at home?: Yes People in Home: sibling(s) Name of Support/Comfort Primary Source: Patients brother assists  Medications Reviewed Today: Medications Reviewed Today     Reviewed by Jodelle Gross, RN (Case Manager) on 10/24/22 at 1043  Med List Status: <None>   Medication Order Taking? Sig Documenting Provider Last Dose Status Informant  acetaminophen (TYLENOL) 500 MG tablet [id number] Yes Take 2 tablets (1,000 mg total) by mouth at bedtime. Johnson, Clanford L, MD Taking Active   amLODipine (NORVASC) 10 MG tablet [id number] Yes TAKE 1 TABLET BY MOUTH EVERY DAY  Patient taking differently: Take 10 mg by mouth daily.   Kerri Perches, MD Taking Active Self, Pharmacy Records  aspirin EC 81 MG tablet [id number] No Take 81 mg by mouth daily. Swallow whole. [provider] Unknown Active Self, Pharmacy Records  ciprofloxacin (CIPRO) 500 MG tablet [id number]  Take 1 tablet (500 mg total) by mouth 2 (two) times daily for 14 days. Kerri Perches, MD  Active Self, Pharmacy Records           Med Note Mayford Knife, [place] S   Wed Oct 17, 2022  8:31 PM) Pt  has not started yet  diclofenac Sodium (VOLTAREN) 1 % GEL [id number]  APPLY 4 G TOPICALLY 4 (FOUR) TIMES DAILY AS NEEDED (PAIN). Vickki Hearing, MD  Active Self, Pharmacy Records  diphenhydramine-acetaminophen (TYLENOL PM) 25-500 MG TABS tablet [id number]  Take 2 tablets by mouth at bedtime. [provider]  Active Self, Pharmacy Records  Dulaglutide (TRULICITY) 0.75 MG/0.5ML SOPN [id number]  INJECT 0.75 MG INTO THE SKIN WEEKLY  Patient taking differently: Inject 0.75 mg into the skin every Thursday.   Kerri Perches, MD  Active Self, Pharmacy Records           Med Note Roma Schanz Oct 24, 2022  9:25 AM) Last dose was May 1. Per patient  folic acid (FOLVITE) 1 MG tablet [id number]  Take 1 tablet (1 mg total) by mouth daily. Catarina Hartshorn, MD  Active Self, Pharmacy Records  gabapentin (NEURONTIN) 300 MG capsule [id number]  Take 1 capsule (300 mg total) by mouth 3 (three) times daily as needed (pain). Cleora Fleet, MD  Active   glucose blood [facility] ULTRA) test strip [id number]  USE AS DIRECTED Kerri Perches, MD  Active Self, Pharmacy Records  nitroGLYCERIN (NITROSTAT) 0.4 MG SL tablet [id number]  Place 1 tablet (0.4 mg total) under the tongue every 5 (five) minutes as needed for chest pain. Up to 3 tablets, then call 911. Ellsworth Lennox, PA-C  Active Self, Pharmacy Records           Med Note Nedra Hai, Lannie Fields Sep 23, 2022  3:04 PM) Has on hand if needed for chest pain.  OneTouch Delica Lancets 30G MISC [id number]  Once daily testing dx e11.9 Kerri Perches, MD  Active Self, Pharmacy Records  pantoprazole (PROTONIX) 40 MG tablet [id number]  TAKE 1 TABLET BY MOUTH TWICE A DAY Kerri Perches, MD  Active Self, Pharmacy Records  PARoxetine (PAXIL) 40 MG tablet [id number]  TAKE 1 TABLET (40 MG TOTAL) BY MOUTH IN THE MORNING Kerri Perches, MD  Active Self, Pharmacy Records  rosuvastatin (CRESTOR) 20 MG tablet [id number]  Take 20 mg by mouth daily.  [provider]  Active Self, Pharmacy Records  SYMBICORT 80-4.5 MCG/ACT inhaler [id number]  INHALE 2 PUFFS INTO THE LUNGS TWICE A DAY  Patient taking differently: Inhale 2 puffs into the lungs 2 (two) times daily.   Kerri Perches, MD  Active Self, Pharmacy Records  TRADJENTA 5 [facility] TABS tablet [id number]  TAKE 1 TABLET BY MOUTH EVERY DAY Kerri Perches, MD  Active Self, Pharmacy Records           Med Note Patsi Sears, Donnella Sham Oct 24, 2022  9:25 AM) Last dose was 5/14, per patient.  traMADol (ULTRAM) 50 MG tablet [id number]  TAKE 1 TABLET BY MOUTH EVERY 6 HOURS AS NEEDED. Vickki Hearing, MD  Active Self, Pharmacy Records  UNABLE TO FIND [id number]  Diabetic shoes x 1 pair, inserts x 3 pair  DX E11.9 Kerri Perches, MD  Active Self, Pharmacy Records  vitamin B-12 (VITAMIN B12) 500 MCG tablet [id number]  Take 1 tablet (500 mcg total) by mouth daily. Catarina Hartshorn, MD  Active Self, Pharmacy Records            Home Care and Equipment/Supplies: Were Home Health Services Ordered?: Yes Name of Home Health Agency:: [facility] Has Agency set up a time to come to your home?: Yes First Home Health Visit Date: 10/24/22 Any new equipment or medical supplies ordered?: No  Functional Questionnaire: Do you need assistance with bathing/showering or dressing?: No Do you need assistance with meal preparation?: No Do you need assistance with eating?: No Do you have difficulty maintaining continence: No Do you need assistance with getting out of bed/getting out of a chair/moving?: No Do you have difficulty managing or taking your medications?: No  Follow up appointments reviewed: PCP Follow-up appointment confirmed?: NA Specialist Hospital Follow-up appointment confirmed?: Yes Date of Specialist follow-up appointment?: 10/25/22 Follow-Up Specialty Provider:: Dr. Ronne Binning Do you need transportation to your follow-up appointment?: No Do you understand care options if your condition(s)  worsen?: Yes-patient verbalized understanding  SDOH Interventions Today    Flowsheet Row Most Recent Value  SDOH Interventions   Food Insecurity Interventions Intervention Not Indicated  Housing Interventions Intervention Not Indicated  Transportation Interventions Intervention Not Indicated  Financial Strain Interventions Intervention Not Indicated     Jodelle Gross, RN, BSN, CCM Care Management Coordinator [facility] Phone: [phone]/Fax: [phone]

## 2022-10-25 ENCOUNTER — Ambulatory Visit (HOSPITAL_BASED_OUTPATIENT_CLINIC_OR_DEPARTMENT_OTHER): Payer: 59 | Admitting: Anesthesiology

## 2022-10-25 ENCOUNTER — Telehealth: Payer: Self-pay

## 2022-10-25 ENCOUNTER — Ambulatory Visit (HOSPITAL_COMMUNITY): Payer: 59

## 2022-10-25 ENCOUNTER — Ambulatory Visit (HOSPITAL_COMMUNITY): Payer: 59 | Admitting: Anesthesiology

## 2022-10-25 ENCOUNTER — Encounter (HOSPITAL_COMMUNITY): Payer: Self-pay | Admitting: Urology

## 2022-10-25 ENCOUNTER — Encounter (HOSPITAL_COMMUNITY)
Admission: RE | Disposition: A | Discharge status: Home / Self Care | Payer: Self-pay | Source: Home / Self Care | Attending: Urology

## 2022-10-25 ENCOUNTER — Ambulatory Visit (HOSPITAL_COMMUNITY)
Admission: RE | Admit: 2022-10-25 | Discharge: 2022-10-25 | Disposition: A | Discharge status: Home / Self Care | Payer: 59 | Attending: Urology | Admitting: Urology

## 2022-10-25 DIAGNOSIS — I10 Essential (primary) hypertension: Secondary | ICD-10-CM | POA: Diagnosis not present

## 2022-10-25 DIAGNOSIS — Z7984 Long term (current) use of oral hypoglycemic drugs: Secondary | ICD-10-CM | POA: Insufficient documentation

## 2022-10-25 DIAGNOSIS — N189 Chronic kidney disease, unspecified: Secondary | ICD-10-CM | POA: Diagnosis not present

## 2022-10-25 DIAGNOSIS — E1122 Type 2 diabetes mellitus with diabetic chronic kidney disease: Secondary | ICD-10-CM | POA: Diagnosis not present

## 2022-10-25 DIAGNOSIS — Z87891 Personal history of nicotine dependence: Secondary | ICD-10-CM | POA: Diagnosis not present

## 2022-10-25 DIAGNOSIS — N183 Chronic kidney disease, stage 3 unspecified: Secondary | ICD-10-CM | POA: Diagnosis not present

## 2022-10-25 DIAGNOSIS — N132 Hydronephrosis with renal and ureteral calculous obstruction: Secondary | ICD-10-CM | POA: Diagnosis not present

## 2022-10-25 DIAGNOSIS — Z79899 Other long term (current) drug therapy: Secondary | ICD-10-CM | POA: Diagnosis not present

## 2022-10-25 DIAGNOSIS — I129 Hypertensive chronic kidney disease with stage 1 through stage 4 chronic kidney disease, or unspecified chronic kidney disease: Secondary | ICD-10-CM | POA: Diagnosis not present

## 2022-10-25 DIAGNOSIS — K219 Gastro-esophageal reflux disease without esophagitis: Secondary | ICD-10-CM | POA: Insufficient documentation

## 2022-10-25 DIAGNOSIS — N201 Calculus of ureter: Secondary | ICD-10-CM | POA: Diagnosis not present

## 2022-10-25 DIAGNOSIS — E1151 Type 2 diabetes mellitus with diabetic peripheral angiopathy without gangrene: Secondary | ICD-10-CM | POA: Diagnosis not present

## 2022-10-25 DIAGNOSIS — Z09 Encounter for follow-up examination after completed treatment for conditions other than malignant neoplasm: Secondary | ICD-10-CM | POA: Insufficient documentation

## 2022-10-25 DIAGNOSIS — M25512 Pain in left shoulder: Secondary | ICD-10-CM

## 2022-10-25 HISTORY — PX: CYSTOSCOPY W/ URETERAL STENT PLACEMENT: SHX1429

## 2022-10-25 HISTORY — PX: CYSTOSCOPY WITH RETROGRADE PYELOGRAM, URETEROSCOPY AND STENT PLACEMENT: SHX5789

## 2022-10-25 HISTORY — PX: HOLMIUM LASER APPLICATION: SHX5852

## 2022-10-25 LAB — GLUCOSE, CAPILLARY
Glucose-Capillary: 103 mg/dL — ABNORMAL HIGH (ref 70–99)
Glucose-Capillary: 102 mg/dL — ABNORMAL HIGH (ref 70–99)

## 2022-10-25 SURGERY — CYSTOURETEROSCOPY, WITH RETROGRADE PYELOGRAM AND STENT INSERTION
Anesthesia: General | Site: Ureter | Laterality: Right

## 2022-10-25 MED ORDER — LACTATED RINGERS IV SOLN: INTRAVENOUS | Status: DC

## 2022-10-25 MED ORDER — LIDOCAINE HCL (PF) 2 % IJ SOLN
INTRAMUSCULAR | Status: AC
  Filled 2022-10-25: qty 5

## 2022-10-25 MED ORDER — DIATRIZOATE MEGLUMINE 30 % UR SOLN
URETHRAL | Status: DC | PRN
  Administered 2022-10-25: 20 mL via URETHRAL

## 2022-10-25 MED ORDER — WATER FOR IRRIGATION, STERILE IR SOLN
Status: DC | PRN
  Administered 2022-10-25: 1000 mL

## 2022-10-25 MED ORDER — CEFAZOLIN SODIUM-DEXTROSE 2-4 GM/100ML-% IV SOLN
INTRAVENOUS | Status: AC
  Filled 2022-10-25: qty 100

## 2022-10-25 MED ORDER — HYDROCODONE-ACETAMINOPHEN 5-325 MG PO TABS: 1.0000 | ORAL_TABLET | Freq: Four times a day (QID) | ORAL | 0 refills | Status: DC | PRN

## 2022-10-25 MED ORDER — FENTANYL CITRATE (PF) 100 MCG/2ML IJ SOLN
INTRAMUSCULAR | Status: DC | PRN
  Administered 2022-10-25: 50 ug via INTRAVENOUS

## 2022-10-25 MED ORDER — FENTANYL CITRATE (PF) 100 MCG/2ML IJ SOLN
INTRAMUSCULAR | Status: AC
  Filled 2022-10-25: qty 2

## 2022-10-25 MED ORDER — DIATRIZOATE MEGLUMINE 30 % UR SOLN
URETHRAL | Status: AC
  Filled 2022-10-25: qty 100

## 2022-10-25 MED ORDER — CEFAZOLIN SODIUM-DEXTROSE 2-4 GM/100ML-% IV SOLN
2.0000 g | INTRAVENOUS | Status: AC
  Administered 2022-10-25: 2 g via INTRAVENOUS

## 2022-10-25 MED ORDER — HYDROMORPHONE HCL 1 MG/ML IJ SOLN: 0.2500 mg | INTRAMUSCULAR | Status: DC | PRN

## 2022-10-25 MED ORDER — SUGAMMADEX SODIUM 500 MG/5ML IV SOLN
INTRAVENOUS | Status: DC | PRN
  Administered 2022-10-25: 200 mg via INTRAVENOUS

## 2022-10-25 MED ORDER — ONDANSETRON HCL 4 MG/2ML IJ SOLN
INTRAMUSCULAR | Status: AC
  Filled 2022-10-25: qty 2

## 2022-10-25 MED ORDER — DEXAMETHASONE SODIUM PHOSPHATE 10 MG/ML IJ SOLN
INTRAMUSCULAR | Status: DC | PRN
  Administered 2022-10-25: 5 mg via INTRAVENOUS

## 2022-10-25 MED ORDER — ONDANSETRON HCL 4 MG/2ML IJ SOLN
INTRAMUSCULAR | Status: DC | PRN
  Administered 2022-10-25: 4 mg via INTRAVENOUS

## 2022-10-25 MED ORDER — SODIUM CHLORIDE 0.9 % IR SOLN
Status: DC | PRN
  Administered 2022-10-25 (×2): 3000 mL

## 2022-10-25 MED ORDER — SULFAMETHOXAZOLE-TRIMETHOPRIM 800-160 MG PO TABS: 1.0000 | ORAL_TABLET | Freq: Two times a day (BID) | ORAL | 0 refills | Status: DC

## 2022-10-25 MED ORDER — PROPOFOL 10 MG/ML IV BOLUS
INTRAVENOUS | Status: AC
  Filled 2022-10-25: qty 20

## 2022-10-25 MED ORDER — CHLORHEXIDINE GLUCONATE 0.12 % MT SOLN
15.0000 mL | Freq: Once | OROMUCOSAL | Status: AC
  Administered 2022-10-25: 15 mL via OROMUCOSAL

## 2022-10-25 MED ORDER — ONDANSETRON HCL 4 MG/2ML IJ SOLN: 4.0000 mg | Freq: Once | INTRAMUSCULAR | Status: DC | PRN

## 2022-10-25 MED ORDER — ALBUTEROL SULFATE HFA 108 (90 BASE) MCG/ACT IN AERS
INHALATION_SPRAY | RESPIRATORY_TRACT | Status: DC | PRN
  Administered 2022-10-25: 3 via RESPIRATORY_TRACT

## 2022-10-25 MED ORDER — PROPOFOL 10 MG/ML IV BOLUS
INTRAVENOUS | Status: DC | PRN
  Administered 2022-10-25: 150 mg via INTRAVENOUS

## 2022-10-25 MED ORDER — ROCURONIUM BROMIDE 100 MG/10ML IV SOLN
INTRAVENOUS | Status: DC | PRN
  Administered 2022-10-25: 60 mg via INTRAVENOUS

## 2022-10-25 MED ORDER — DEXAMETHASONE SODIUM PHOSPHATE 10 MG/ML IJ SOLN
INTRAMUSCULAR | Status: AC
  Filled 2022-10-25: qty 1

## 2022-10-25 MED ORDER — ROCURONIUM BROMIDE 10 MG/ML (PF) SYRINGE
PREFILLED_SYRINGE | INTRAVENOUS | Status: AC
  Filled 2022-10-25: qty 10

## 2022-10-25 MED ORDER — ORAL CARE MOUTH RINSE: 15.0000 mL | Freq: Once | OROMUCOSAL | Status: AC

## 2022-10-25 SURGICAL SUPPLY — 26 items
BAG DRAIN URO TABLE W/ADPT NS (BAG) ×3 IMPLANT
BAG DRN 8 ADPR NS SKTRN CSTL (BAG) ×2
BAG HAMPER (MISCELLANEOUS) ×3 IMPLANT
CATH INTERMIT  6FR 70CM (CATHETERS) ×3 IMPLANT
CLOTH BEACON ORANGE TIMEOUT ST (SAFETY) ×3 IMPLANT
DECANTER SPIKE VIAL GLASS SM (MISCELLANEOUS) ×3 IMPLANT
EXTRACTOR STONE NITINOL NGAGE (UROLOGICAL SUPPLIES) IMPLANT
GLOVE BIO SURGEON STRL SZ8 (GLOVE) ×3 IMPLANT
GLOVE BIOGEL PI IND STRL 7.0 (GLOVE) ×6 IMPLANT
GOWN STRL REUS W/TWL LRG LVL3 (GOWN DISPOSABLE) ×3 IMPLANT
GOWN STRL REUS W/TWL XL LVL3 (GOWN DISPOSABLE) ×3 IMPLANT
GUIDEWIRE STR DUAL SENSOR (WIRE) ×3 IMPLANT
GUIDEWIRE STR ZIPWIRE 035X150 (MISCELLANEOUS) ×3 IMPLANT
IV NS IRRIG 3000ML ARTHROMATIC (IV SOLUTION) ×6 IMPLANT
KIT TURNOVER CYSTO (KITS) ×3 IMPLANT
MANIFOLD NEPTUNE II (INSTRUMENTS) ×3 IMPLANT
PACK CYSTO (CUSTOM PROCEDURE TRAY) ×3 IMPLANT
PAD ARMBOARD 7.5X6 YLW CONV (MISCELLANEOUS) ×3 IMPLANT
SHEATH URETERAL 12FRX35CM (MISCELLANEOUS) IMPLANT
STENT URET 6FRX26 CONTOUR (STENTS) IMPLANT
SYR 10ML LL (SYRINGE) ×3 IMPLANT
SYR CONTROL 10ML LL (SYRINGE) ×3 IMPLANT
TOWEL OR 17X26 4PK STRL BLUE (TOWEL DISPOSABLE) ×3 IMPLANT
TRACTIP FLEXIVA PULS ID 200XHI (Laser) IMPLANT
TRACTIP FLEXIVA PULSE ID 200 (Laser) ×2
WATER STERILE IRR 500ML POUR (IV SOLUTION) ×3 IMPLANT

## 2022-10-25 NOTE — Anesthesia Procedure Notes (Signed)
Procedure Name: Intubation Date/Time: 10/25/2022 9:42 AM  Performed by: Franco Nones, CRNAPre-anesthesia Checklist: Patient identified, Patient being monitored, Timeout performed, Emergency Drugs available and Suction available Patient Re-evaluated:Patient Re-evaluated prior to induction Oxygen Delivery Method: Circle system utilized Preoxygenation: Pre-oxygenation with 100% oxygen Induction Type: IV induction Ventilation: Mask ventilation without difficulty Laryngoscope Size: Mac and 3 Grade View: Grade I Tube type: Oral Tube size: 7.0 mm Number of attempts: 1 Airway Equipment and Method: Stylet Placement Confirmation: ETT inserted through vocal cords under direct vision, positive ETCO2 and breath sounds checked- equal and bilateral Secured at: 21 cm Tube secured with: Tape Dental Injury: Teeth and Oropharynx as per pre-operative assessment

## 2022-10-25 NOTE — Telephone Encounter (Signed)
Patient left voicemail with surgery questions.  Message sent to surgery scheduler to return patient call.

## 2022-10-25 NOTE — Interval H&P Note (Signed)
History and Physical Interval Note:  10/25/2022 9:26 AM  Cheryl Abbott  has presented today for surgery, with the diagnosis of Right ureteral calculus.  The various methods of treatment have been discussed with the patient and family. After consideration of risks, benefits and other options for treatment, the patient has consented to  Procedure(s): CYSTOSCOPY WITH RETROGRADE PYELOGRAM, URETEROSCOPY AND STENT PLACEMENT- stent exchange (Bilateral) HOLMIUM LASER APPLICATION (Bilateral) as a surgical intervention.  The patient's history has been reviewed, patient examined, no change in status, stable for surgery.  I have reviewed the patient's chart and labs.  Questions were answered to the patient's satisfaction.     Wilkie Aye

## 2022-10-25 NOTE — Op Note (Signed)
.  Preoperative diagnosis: bilateral ureteral stone  Postoperative diagnosis: right ureteral calculus  Procedure: 1 cystoscopy 2. Bilateral retrograde pyelography 3.  Intraoperative fluoroscopy, under one hour, with interpretation 4.  right ureteroscopic stone manipulation with laser lithotripsy 5.  Left diagnostic ureteroscopy 6. Right 6 x 26 JJ stent placement  Attending: Cleda Mccreedy  Anesthesia: General  Estimated blood loss: None  Drains: right 6 x 26 JJ ureteral stent with tether  Specimens: stone for analysis  Antibiotics: ancef  Findings: right proximal ureteral calculus. No left ureteral calculus.  Mild left hydronephrosis Moderate right hydronephrosis. No masses/lesions in the bladder. Ureteral orifices in normal anatomic location.  Indications: Patient is a 77 year old female with a history of bilateral distal ureteral stone. After discussing treatment options, they decided proceed with bilateral ureteroscopic stone manipulation.  Procedure in detail: The patient was brought to the operating room and a brief timeout was done to ensure correct patient, correct procedure, correct site.  General anesthesia was administered patient was placed in dorsal lithotomy position.  Her genitalia was then prepped and draped in usual sterile fashion.  A rigid 22 French cystoscope was passed in the urethra and the bladder.  Bladder was inspected free masses or lesions.  the ureteral orifices were in the normal orthotopic locations. a 6 french ureteral catheter was then instilled into the left ureteral orifice.  a gentle retrograde was obtained and findings noted above.  we then removed the cystoscope and cannulated the left ureteral orifice with a semirigid ureteroscope.  We located no stone in the ureter. There was edema of the distal ureter consistent with a recently passed calculus. We then turned out attention to the right side. Using a grasper the ureteral stent was brought to the  urethral meatus. Through the stent a zipwire was advanced up to the renal pelvis. The stent was then removed. a 6 french ureteral catheter was then instilled into the right ureteral orifice.  a gentle retrograde was obtained and findings noted above.  we then removed the cystoscope and cannulated the left ureteral orifice with a semirigid ureteroscope.  We located the stone in the proximal ureter. Using a 242 nm laser fiber and fragmented the stone into smaller pieces.  the pieces were then removed with a engage basket.   once all stone fragments were removed we then placed a 6 x 26 double-j ureteral stent over the original zip wire.  We then removed the wire and good coil was noted in the the renal pelvis under fluoroscopy and the bladder under direct vision. the stone fragments were then removed from the bladder and sent for analysis.   the bladder was then drained and this concluded the procedure which was well tolerated by patient.  Complications: None  Condition: Stable, extubated, transferred to PACU  Plan: Patient is to be discharged home as to follow-up in 1 week. She is to remove her stent in 72 hours by pulling the tether

## 2022-10-25 NOTE — Telephone Encounter (Signed)
I returned her call, pt states she had already had her surgery and did not have any questions at this time.

## 2022-10-25 NOTE — Anesthesia Postprocedure Evaluation (Signed)
Anesthesia Post Note  Patient: Cheryl Abbott  Procedure(s) Performed: CYSTOSCOPY WITH RETROGRADE PYELOGRAM, URETEROSCOPY (Bilateral: Ureter) HOLMIUM LASER APPLICATION (Right: Ureter) RIGHT URETERAL STENT EXCHANGE (Right: Ureter)  Patient location during evaluation: Phase II Anesthesia Type: General Level of consciousness: awake and alert and oriented Pain management: pain level controlled Vital Signs Assessment: post-procedure vital signs reviewed and stable Respiratory status: spontaneous breathing, nonlabored ventilation and respiratory function stable Cardiovascular status: blood pressure returned to baseline and stable Postop Assessment: no apparent nausea or vomiting Anesthetic complications: no  No notable events documented.   Last Vitals:  Vitals:   10/25/22 1059 10/25/22 1101  BP:  114/86  Pulse: 99 98  Resp: (!) 21 20  Temp:    SpO2: (!) 82% 92%    Last Pain:  Vitals:   10/25/22 1045  TempSrc:   PainSc: 0-No pain                 Katelan Hirt C [place]

## 2022-10-25 NOTE — Telephone Encounter (Signed)
Returned call with no answer, unable to leave voicemail.  Will try again at a later time.

## 2022-10-25 NOTE — Interval H&P Note (Signed)
History and Physical Interval Note:  10/25/2022 8:59 AM  Cheryl Abbott  has presented today for surgery, with the diagnosis of Right ureteral calculus.  The various methods of treatment have been discussed with the patient and family. After consideration of risks, benefits and other options for treatment, the patient has consented to  Procedure(s): CYSTOSCOPY WITH RETROGRADE PYELOGRAM, URETEROSCOPY AND STENT PLACEMENT- stent exchange (Right) HOLMIUM LASER APPLICATION (Right) as a surgical intervention.  The patient's history has been reviewed, patient examined, no change in status, stable for surgery.  I have reviewed the patient's chart and labs.  Questions were answered to the patient's satisfaction.     Wilkie Aye

## 2022-10-25 NOTE — Transfer of Care (Signed)
Immediate Anesthesia Transfer of Care Note  Patient: Cheryl Abbott  Procedure(s) Performed: CYSTOSCOPY WITH RETROGRADE PYELOGRAM, URETEROSCOPY (Bilateral: Ureter) HOLMIUM LASER APPLICATION (Right: Ureter) RIGHT URETERAL STENT EXCHANGE (Right: Ureter)  Patient Location: PACU  Anesthesia Type:General  Level of Consciousness: awake and alert   Airway & Oxygen Therapy: Patient Spontanous Breathing and non-rebreather face mask  Post-op Assessment: Report given to RN and Post -op Vital signs reviewed and stable  Post vital signs: Reviewed and stable  Last Vitals:  Vitals Value Taken Time  BP 128/65 10/25/22 1035  Temp 98.4 10/25/22 1036  Pulse 104 10/25/22 1035  Resp 27 10/25/22 1035  SpO2 93 % 10/25/22 1035  Vitals shown include unvalidated device data.  Last Pain:  Vitals:   10/25/22 0759  TempSrc: Oral  PainSc: 0-No pain         Complications: No notable events documented.

## 2022-10-25 NOTE — Anesthesia Preprocedure Evaluation (Signed)
Anesthesia Evaluation  Patient identified by MRN, date of birth, ID band Patient awake    Reviewed: Allergy & Precautions, H&P , NPO status , Patient's Chart, lab work & pertinent test results  Airway Mallampati: II  TM Distance: >3 FB Neck ROM: Full    Dental no notable dental hx. (+) Dental Advisory Given, Teeth Intact   Pulmonary sleep apnea , former smoker   Pulmonary exam normal breath sounds clear to auscultation       Cardiovascular hypertension, Pt. on medications + Peripheral Vascular Disease  Normal cardiovascular exam Rhythm:Regular Rate:Normal     Neuro/Psych  Headaches PSYCHIATRIC DISORDERS Anxiety Depression     Neuromuscular disease    GI/Hepatic Neg liver ROS,GERD  Medicated and Controlled,,  Endo/Other  diabetes, Well Controlled, Type 2, Oral Hypoglycemic Agents    Renal/GU Renal InsufficiencyRenal disease  negative genitourinary   Musculoskeletal  (+) Arthritis , Osteoarthritis,    Abdominal   Peds negative pediatric ROS (+)  Hematology  (+) Blood dyscrasia (myeloma), anemia   Anesthesia Other Findings   Reproductive/Obstetrics negative OB ROS                             Anesthesia Physical Anesthesia Plan  ASA: 3  Anesthesia Plan: General   Post-op Pain Management: Dilaudid IV   Induction: Intravenous and Rapid sequence  PONV Risk Score and Plan: 4 or greater and Ondansetron and Dexamethasone  Airway Management Planned: Oral ETT  Additional Equipment:   Intra-op Plan:   Post-operative Plan: Extubation in OR  Informed Consent: I have reviewed the patients History and Physical, chart, labs and discussed the procedure including the risks, benefits and alternatives for the proposed anesthesia with the patient or authorized representative who has indicated his/her understanding and acceptance.     Dental advisory given  Plan Discussed with: CRNA and  Surgeon  Anesthesia Plan Comments:         Anesthesia Quick Evaluation

## 2022-10-29 ENCOUNTER — Encounter: Payer: Self-pay | Admitting: Family Medicine

## 2022-10-29 ENCOUNTER — Telehealth: Payer: Self-pay | Admitting: Family Medicine

## 2022-10-29 DIAGNOSIS — Z0279 Encounter for issue of other medical certificate: Secondary | ICD-10-CM

## 2022-10-29 NOTE — Telephone Encounter (Signed)
Message left on tele number for Cheryl  Abbott whose FMLA paperwork is to be completed at a different number and surname that are in patient record I am calling [place] , her daughter is Urbano Heir and is the person and correct tele number I am trying to contact Spoke with Cheryl Dunnb, form completed and sleeved

## 2022-10-30 ENCOUNTER — Encounter (HOSPITAL_COMMUNITY): Payer: Self-pay | Admitting: Urology

## 2022-10-30 NOTE — Telephone Encounter (Signed)
Form completed and charge dropped for form fee. Paperwork ready for pick up

## 2022-10-31 LAB — CALCULI, WITH PHOTOGRAPH (CLINICAL LAB)
Calcium Oxalate Dihydrate: 40 %
Calcium Oxalate Monohydrate: 60 %
Weight Calculi: 40 mg

## 2022-10-31 NOTE — Telephone Encounter (Signed)
Patient daughter Urbano Heir called asked to fax again and forms were faxed and give copy to patient at her next appointment to give to [facility]. Daughter Rosey Bath paid for the forms over phone

## 2022-11-02 DIAGNOSIS — E785 Hyperlipidemia, unspecified: Secondary | ICD-10-CM | POA: Diagnosis not present

## 2022-11-02 DIAGNOSIS — K219 Gastro-esophageal reflux disease without esophagitis: Secondary | ICD-10-CM | POA: Diagnosis not present

## 2022-11-02 DIAGNOSIS — N136 Pyonephrosis: Secondary | ICD-10-CM | POA: Diagnosis not present

## 2022-11-02 DIAGNOSIS — C9000 Multiple myeloma not having achieved remission: Secondary | ICD-10-CM | POA: Diagnosis not present

## 2022-11-02 DIAGNOSIS — G4733 Obstructive sleep apnea (adult) (pediatric): Secondary | ICD-10-CM | POA: Diagnosis not present

## 2022-11-02 DIAGNOSIS — K5730 Diverticulosis of large intestine without perforation or abscess without bleeding: Secondary | ICD-10-CM | POA: Diagnosis not present

## 2022-11-02 DIAGNOSIS — Z7982 Long term (current) use of aspirin: Secondary | ICD-10-CM | POA: Diagnosis not present

## 2022-11-02 DIAGNOSIS — E1151 Type 2 diabetes mellitus with diabetic peripheral angiopathy without gangrene: Secondary | ICD-10-CM | POA: Diagnosis not present

## 2022-11-02 DIAGNOSIS — M1990 Unspecified osteoarthritis, unspecified site: Secondary | ICD-10-CM | POA: Diagnosis not present

## 2022-11-02 DIAGNOSIS — G8929 Other chronic pain: Secondary | ICD-10-CM | POA: Diagnosis not present

## 2022-11-02 DIAGNOSIS — F32A Depression, unspecified: Secondary | ICD-10-CM | POA: Diagnosis not present

## 2022-11-02 DIAGNOSIS — M25512 Pain in left shoulder: Secondary | ICD-10-CM | POA: Diagnosis not present

## 2022-11-02 DIAGNOSIS — Z79891 Long term (current) use of opiate analgesic: Secondary | ICD-10-CM | POA: Diagnosis not present

## 2022-11-02 DIAGNOSIS — M5116 Intervertebral disc disorders with radiculopathy, lumbar region: Secondary | ICD-10-CM | POA: Diagnosis not present

## 2022-11-02 DIAGNOSIS — M5430 Sciatica, unspecified side: Secondary | ICD-10-CM | POA: Diagnosis not present

## 2022-11-02 DIAGNOSIS — E1142 Type 2 diabetes mellitus with diabetic polyneuropathy: Secondary | ICD-10-CM | POA: Diagnosis not present

## 2022-11-02 DIAGNOSIS — M25511 Pain in right shoulder: Secondary | ICD-10-CM | POA: Diagnosis not present

## 2022-11-02 DIAGNOSIS — Z7985 Long-term (current) use of injectable non-insulin antidiabetic drugs: Secondary | ICD-10-CM | POA: Diagnosis not present

## 2022-11-02 DIAGNOSIS — E611 Iron deficiency: Secondary | ICD-10-CM | POA: Diagnosis not present

## 2022-11-02 DIAGNOSIS — A419 Sepsis, unspecified organism: Secondary | ICD-10-CM | POA: Diagnosis not present

## 2022-11-02 DIAGNOSIS — I129 Hypertensive chronic kidney disease with stage 1 through stage 4 chronic kidney disease, or unspecified chronic kidney disease: Secondary | ICD-10-CM | POA: Diagnosis not present

## 2022-11-02 DIAGNOSIS — E1122 Type 2 diabetes mellitus with diabetic chronic kidney disease: Secondary | ICD-10-CM | POA: Diagnosis not present

## 2022-11-02 DIAGNOSIS — N1832 Chronic kidney disease, stage 3b: Secondary | ICD-10-CM | POA: Diagnosis not present

## 2022-11-06 DIAGNOSIS — M25512 Pain in left shoulder: Secondary | ICD-10-CM | POA: Diagnosis not present

## 2022-11-06 DIAGNOSIS — E611 Iron deficiency: Secondary | ICD-10-CM | POA: Diagnosis not present

## 2022-11-06 DIAGNOSIS — M5430 Sciatica, unspecified side: Secondary | ICD-10-CM | POA: Diagnosis not present

## 2022-11-06 DIAGNOSIS — C9000 Multiple myeloma not having achieved remission: Secondary | ICD-10-CM | POA: Diagnosis not present

## 2022-11-06 DIAGNOSIS — E785 Hyperlipidemia, unspecified: Secondary | ICD-10-CM | POA: Diagnosis not present

## 2022-11-06 DIAGNOSIS — M5116 Intervertebral disc disorders with radiculopathy, lumbar region: Secondary | ICD-10-CM | POA: Diagnosis not present

## 2022-11-06 DIAGNOSIS — K219 Gastro-esophageal reflux disease without esophagitis: Secondary | ICD-10-CM | POA: Diagnosis not present

## 2022-11-06 DIAGNOSIS — Z7982 Long term (current) use of aspirin: Secondary | ICD-10-CM | POA: Diagnosis not present

## 2022-11-06 DIAGNOSIS — E1142 Type 2 diabetes mellitus with diabetic polyneuropathy: Secondary | ICD-10-CM | POA: Diagnosis not present

## 2022-11-06 DIAGNOSIS — I129 Hypertensive chronic kidney disease with stage 1 through stage 4 chronic kidney disease, or unspecified chronic kidney disease: Secondary | ICD-10-CM | POA: Diagnosis not present

## 2022-11-06 DIAGNOSIS — M1990 Unspecified osteoarthritis, unspecified site: Secondary | ICD-10-CM | POA: Diagnosis not present

## 2022-11-06 DIAGNOSIS — G8929 Other chronic pain: Secondary | ICD-10-CM | POA: Diagnosis not present

## 2022-11-06 DIAGNOSIS — E1151 Type 2 diabetes mellitus with diabetic peripheral angiopathy without gangrene: Secondary | ICD-10-CM | POA: Diagnosis not present

## 2022-11-06 DIAGNOSIS — K5730 Diverticulosis of large intestine without perforation or abscess without bleeding: Secondary | ICD-10-CM | POA: Diagnosis not present

## 2022-11-06 DIAGNOSIS — N136 Pyonephrosis: Secondary | ICD-10-CM | POA: Diagnosis not present

## 2022-11-06 DIAGNOSIS — Z7985 Long-term (current) use of injectable non-insulin antidiabetic drugs: Secondary | ICD-10-CM | POA: Diagnosis not present

## 2022-11-06 DIAGNOSIS — M25511 Pain in right shoulder: Secondary | ICD-10-CM | POA: Diagnosis not present

## 2022-11-06 DIAGNOSIS — A419 Sepsis, unspecified organism: Secondary | ICD-10-CM | POA: Diagnosis not present

## 2022-11-06 DIAGNOSIS — Z79891 Long term (current) use of opiate analgesic: Secondary | ICD-10-CM | POA: Diagnosis not present

## 2022-11-06 DIAGNOSIS — G4733 Obstructive sleep apnea (adult) (pediatric): Secondary | ICD-10-CM | POA: Diagnosis not present

## 2022-11-06 DIAGNOSIS — N1832 Chronic kidney disease, stage 3b: Secondary | ICD-10-CM | POA: Diagnosis not present

## 2022-11-06 DIAGNOSIS — E1122 Type 2 diabetes mellitus with diabetic chronic kidney disease: Secondary | ICD-10-CM | POA: Diagnosis not present

## 2022-11-06 DIAGNOSIS — F32A Depression, unspecified: Secondary | ICD-10-CM | POA: Diagnosis not present

## 2022-11-08 DIAGNOSIS — N1832 Chronic kidney disease, stage 3b: Secondary | ICD-10-CM | POA: Diagnosis not present

## 2022-11-08 DIAGNOSIS — M25512 Pain in left shoulder: Secondary | ICD-10-CM | POA: Diagnosis not present

## 2022-11-08 DIAGNOSIS — Z7982 Long term (current) use of aspirin: Secondary | ICD-10-CM | POA: Diagnosis not present

## 2022-11-08 DIAGNOSIS — E611 Iron deficiency: Secondary | ICD-10-CM | POA: Diagnosis not present

## 2022-11-08 DIAGNOSIS — M1990 Unspecified osteoarthritis, unspecified site: Secondary | ICD-10-CM | POA: Diagnosis not present

## 2022-11-08 DIAGNOSIS — A419 Sepsis, unspecified organism: Secondary | ICD-10-CM | POA: Diagnosis not present

## 2022-11-08 DIAGNOSIS — M25511 Pain in right shoulder: Secondary | ICD-10-CM | POA: Diagnosis not present

## 2022-11-08 DIAGNOSIS — E785 Hyperlipidemia, unspecified: Secondary | ICD-10-CM | POA: Diagnosis not present

## 2022-11-08 DIAGNOSIS — G8929 Other chronic pain: Secondary | ICD-10-CM | POA: Diagnosis not present

## 2022-11-08 DIAGNOSIS — I129 Hypertensive chronic kidney disease with stage 1 through stage 4 chronic kidney disease, or unspecified chronic kidney disease: Secondary | ICD-10-CM | POA: Diagnosis not present

## 2022-11-08 DIAGNOSIS — Z7985 Long-term (current) use of injectable non-insulin antidiabetic drugs: Secondary | ICD-10-CM | POA: Diagnosis not present

## 2022-11-08 DIAGNOSIS — N136 Pyonephrosis: Secondary | ICD-10-CM | POA: Diagnosis not present

## 2022-11-08 DIAGNOSIS — M5430 Sciatica, unspecified side: Secondary | ICD-10-CM | POA: Diagnosis not present

## 2022-11-08 DIAGNOSIS — E1122 Type 2 diabetes mellitus with diabetic chronic kidney disease: Secondary | ICD-10-CM | POA: Diagnosis not present

## 2022-11-08 DIAGNOSIS — E1151 Type 2 diabetes mellitus with diabetic peripheral angiopathy without gangrene: Secondary | ICD-10-CM | POA: Diagnosis not present

## 2022-11-08 DIAGNOSIS — G4733 Obstructive sleep apnea (adult) (pediatric): Secondary | ICD-10-CM | POA: Diagnosis not present

## 2022-11-08 DIAGNOSIS — K5730 Diverticulosis of large intestine without perforation or abscess without bleeding: Secondary | ICD-10-CM | POA: Diagnosis not present

## 2022-11-08 DIAGNOSIS — C9000 Multiple myeloma not having achieved remission: Secondary | ICD-10-CM | POA: Diagnosis not present

## 2022-11-08 DIAGNOSIS — K219 Gastro-esophageal reflux disease without esophagitis: Secondary | ICD-10-CM | POA: Diagnosis not present

## 2022-11-08 DIAGNOSIS — M5116 Intervertebral disc disorders with radiculopathy, lumbar region: Secondary | ICD-10-CM | POA: Diagnosis not present

## 2022-11-08 DIAGNOSIS — Z79891 Long term (current) use of opiate analgesic: Secondary | ICD-10-CM | POA: Diagnosis not present

## 2022-11-08 DIAGNOSIS — E1142 Type 2 diabetes mellitus with diabetic polyneuropathy: Secondary | ICD-10-CM | POA: Diagnosis not present

## 2022-11-08 DIAGNOSIS — F32A Depression, unspecified: Secondary | ICD-10-CM | POA: Diagnosis not present

## 2022-11-09 ENCOUNTER — Other Ambulatory Visit: Payer: Self-pay | Admitting: Orthopedic Surgery

## 2022-11-09 DIAGNOSIS — G8929 Other chronic pain: Secondary | ICD-10-CM

## 2022-11-09 DIAGNOSIS — M25512 Pain in left shoulder: Secondary | ICD-10-CM

## 2022-11-12 ENCOUNTER — Telehealth: Payer: Self-pay

## 2022-11-12 NOTE — Telephone Encounter (Signed)
Tried to call patient to see if she ever had a post op apt after her surgery.  I scheduled a f/u for 06/12.  Patient did not answer or leave a voicemail.  Will try back at a later time.

## 2022-11-13 NOTE — Telephone Encounter (Signed)
Unable to reach patient again or leave a voicemail.  Apt reminder sent via mail.

## 2022-11-14 DIAGNOSIS — A419 Sepsis, unspecified organism: Secondary | ICD-10-CM | POA: Diagnosis not present

## 2022-11-14 DIAGNOSIS — Z7982 Long term (current) use of aspirin: Secondary | ICD-10-CM | POA: Diagnosis not present

## 2022-11-14 DIAGNOSIS — E1142 Type 2 diabetes mellitus with diabetic polyneuropathy: Secondary | ICD-10-CM | POA: Diagnosis not present

## 2022-11-14 DIAGNOSIS — K5730 Diverticulosis of large intestine without perforation or abscess without bleeding: Secondary | ICD-10-CM | POA: Diagnosis not present

## 2022-11-14 DIAGNOSIS — E611 Iron deficiency: Secondary | ICD-10-CM | POA: Diagnosis not present

## 2022-11-14 DIAGNOSIS — M25512 Pain in left shoulder: Secondary | ICD-10-CM | POA: Diagnosis not present

## 2022-11-14 DIAGNOSIS — G4733 Obstructive sleep apnea (adult) (pediatric): Secondary | ICD-10-CM | POA: Diagnosis not present

## 2022-11-14 DIAGNOSIS — M5430 Sciatica, unspecified side: Secondary | ICD-10-CM | POA: Diagnosis not present

## 2022-11-14 DIAGNOSIS — M5116 Intervertebral disc disorders with radiculopathy, lumbar region: Secondary | ICD-10-CM | POA: Diagnosis not present

## 2022-11-14 DIAGNOSIS — N1832 Chronic kidney disease, stage 3b: Secondary | ICD-10-CM | POA: Diagnosis not present

## 2022-11-14 DIAGNOSIS — M25511 Pain in right shoulder: Secondary | ICD-10-CM | POA: Diagnosis not present

## 2022-11-14 DIAGNOSIS — Z79891 Long term (current) use of opiate analgesic: Secondary | ICD-10-CM | POA: Diagnosis not present

## 2022-11-14 DIAGNOSIS — C9000 Multiple myeloma not having achieved remission: Secondary | ICD-10-CM | POA: Diagnosis not present

## 2022-11-14 DIAGNOSIS — G8929 Other chronic pain: Secondary | ICD-10-CM | POA: Diagnosis not present

## 2022-11-14 DIAGNOSIS — K219 Gastro-esophageal reflux disease without esophagitis: Secondary | ICD-10-CM | POA: Diagnosis not present

## 2022-11-14 DIAGNOSIS — E1151 Type 2 diabetes mellitus with diabetic peripheral angiopathy without gangrene: Secondary | ICD-10-CM | POA: Diagnosis not present

## 2022-11-14 DIAGNOSIS — I129 Hypertensive chronic kidney disease with stage 1 through stage 4 chronic kidney disease, or unspecified chronic kidney disease: Secondary | ICD-10-CM | POA: Diagnosis not present

## 2022-11-14 DIAGNOSIS — M1990 Unspecified osteoarthritis, unspecified site: Secondary | ICD-10-CM | POA: Diagnosis not present

## 2022-11-14 DIAGNOSIS — Z7985 Long-term (current) use of injectable non-insulin antidiabetic drugs: Secondary | ICD-10-CM | POA: Diagnosis not present

## 2022-11-14 DIAGNOSIS — N136 Pyonephrosis: Secondary | ICD-10-CM | POA: Diagnosis not present

## 2022-11-14 DIAGNOSIS — F32A Depression, unspecified: Secondary | ICD-10-CM | POA: Diagnosis not present

## 2022-11-14 DIAGNOSIS — E785 Hyperlipidemia, unspecified: Secondary | ICD-10-CM | POA: Diagnosis not present

## 2022-11-14 DIAGNOSIS — E1122 Type 2 diabetes mellitus with diabetic chronic kidney disease: Secondary | ICD-10-CM | POA: Diagnosis not present

## 2022-11-16 ENCOUNTER — Other Ambulatory Visit: Payer: Self-pay | Admitting: Orthopedic Surgery

## 2022-11-18 DIAGNOSIS — R0902 Hypoxemia: Secondary | ICD-10-CM | POA: Diagnosis not present

## 2022-11-20 ENCOUNTER — Encounter: Payer: Self-pay | Admitting: Family Medicine

## 2022-11-20 ENCOUNTER — Ambulatory Visit (INDEPENDENT_AMBULATORY_CARE_PROVIDER_SITE_OTHER): Payer: 59 | Admitting: Family Medicine

## 2022-11-20 VITALS — BP 113/72 | HR 102 | Ht 66.0 in | Wt 181.1 lb

## 2022-11-20 DIAGNOSIS — I10 Essential (primary) hypertension: Secondary | ICD-10-CM

## 2022-11-20 DIAGNOSIS — E1159 Type 2 diabetes mellitus with other circulatory complications: Secondary | ICD-10-CM | POA: Diagnosis not present

## 2022-11-20 DIAGNOSIS — E1121 Type 2 diabetes mellitus with diabetic nephropathy: Secondary | ICD-10-CM | POA: Diagnosis not present

## 2022-11-20 DIAGNOSIS — E785 Hyperlipidemia, unspecified: Secondary | ICD-10-CM

## 2022-11-20 DIAGNOSIS — K219 Gastro-esophageal reflux disease without esophagitis: Secondary | ICD-10-CM

## 2022-11-20 DIAGNOSIS — D472 Monoclonal gammopathy: Secondary | ICD-10-CM | POA: Diagnosis not present

## 2022-11-20 DIAGNOSIS — E663 Overweight: Secondary | ICD-10-CM

## 2022-11-20 DIAGNOSIS — I152 Hypertension secondary to endocrine disorders: Secondary | ICD-10-CM | POA: Diagnosis not present

## 2022-11-20 DIAGNOSIS — G4733 Obstructive sleep apnea (adult) (pediatric): Secondary | ICD-10-CM | POA: Diagnosis not present

## 2022-11-20 DIAGNOSIS — F411 Generalized anxiety disorder: Secondary | ICD-10-CM

## 2022-11-20 DIAGNOSIS — F324 Major depressive disorder, single episode, in partial remission: Secondary | ICD-10-CM

## 2022-11-20 MED ORDER — LINAGLIPTIN 5 MG PO TABS: 5.0000 mg | ORAL_TABLET | Freq: Every day | ORAL | 1 refills | Status: DC

## 2022-11-20 NOTE — Patient Instructions (Addendum)
F/U in 4. 5 to 5 months, call if you need me sooner  Fasting lipid, cmp and EGFR and HBA1C tomorrow morning  It is important that you exercise regularly at least 30 minutes 5 times a week. If you develop chest pain, have severe difficulty breathing, or feel very tired, stop exercising immediately and seek medical attention     Think about what you will eat, plan ahead. Choose " clean, green, fresh or frozen" over canned, processed or packaged foods which are more sugary, salty and fatty. 70 to 75% of food eaten should be vegetables and fruit. Three meals at set times with snacks allowed between meals, but they must be fruit or vegetables. Aim to eat over a 12 hour period , example 7 am to 7 pm, and STOP after  your last meal of the day. Drink water,generally about 64 ounces per day, no other drink is as healthy. Fruit juice is best enjoyed in a healthy way, by EATING the fruit.

## 2022-11-21 ENCOUNTER — Ambulatory Visit (INDEPENDENT_AMBULATORY_CARE_PROVIDER_SITE_OTHER): Payer: 59 | Admitting: Urology

## 2022-11-21 VITALS — BP 119/71 | HR 109 | Ht 66.0 in | Wt 181.2 lb

## 2022-11-21 DIAGNOSIS — N132 Hydronephrosis with renal and ureteral calculous obstruction: Secondary | ICD-10-CM

## 2022-11-21 DIAGNOSIS — E1121 Type 2 diabetes mellitus with diabetic nephropathy: Secondary | ICD-10-CM | POA: Diagnosis not present

## 2022-11-21 DIAGNOSIS — Z09 Encounter for follow-up examination after completed treatment for conditions other than malignant neoplasm: Secondary | ICD-10-CM

## 2022-11-21 DIAGNOSIS — E785 Hyperlipidemia, unspecified: Secondary | ICD-10-CM | POA: Diagnosis not present

## 2022-11-21 DIAGNOSIS — I152 Hypertension secondary to endocrine disorders: Secondary | ICD-10-CM | POA: Diagnosis not present

## 2022-11-21 DIAGNOSIS — Z87442 Personal history of urinary calculi: Secondary | ICD-10-CM

## 2022-11-21 DIAGNOSIS — E1159 Type 2 diabetes mellitus with other circulatory complications: Secondary | ICD-10-CM | POA: Diagnosis not present

## 2022-11-21 LAB — URINALYSIS, ROUTINE W REFLEX MICROSCOPIC
Bilirubin, UA: NEGATIVE
Glucose, UA: NEGATIVE
Ketones, UA: NEGATIVE
Nitrite, UA: NEGATIVE
RBC, UA: NEGATIVE
Specific Gravity, UA: 1.02 (ref 1.005–1.030)
Urobilinogen, Ur: 0.2 mg/dL (ref 0.2–1.0)
pH, UA: 6 (ref 5.0–7.5)

## 2022-11-21 LAB — MICROSCOPIC EXAMINATION: Bacteria, UA: NONE SEEN

## 2022-11-21 MED ORDER — MAGNESIUM 400 MG PO CAPS
1.0000 | ORAL_CAPSULE | Freq: Every day | ORAL | 11 refills | Status: AC
Start: 2022-11-21 — End: ?

## 2022-11-21 NOTE — Progress Notes (Signed)
11/21/2022 10:58 AM   Cheryl Abbott May 17, 1946 [id number]  Referring provider: Kerri Perches, MD [address], [address] [place],  [place] [address]  Followup nephrolithiasis   HPI: [name] is a 77yo here for followup for nephrolithiasis. She removed her stent POD#3. She has been doing well since then. No stone events. No flank pain. No other complaints today   PMH: Past Medical History:  Diagnosis Date   Anxiety disorder    Arthritis    Chronic back pain    Chronic pain of both shoulders    per pt told due to cervical spine pinched nerve   CKD (chronic kidney disease), stage III [facility])    nephrologist--- dr Wolfgang Phoenix;   due to tubular necrosis   Claustrophobia    Depression    Edema of both lower extremities    GERD (gastroesophageal reflux disease)    Headache    Hemorrhoids    w/ intermittant bleeding   History of adenomatous polyp of colon    History of kidney stones    Hx-TIA (transient ischemic attack) 02/26/2016   per pt no residual   Hydronephrosis, left    followed by urologist--- dr Retta Diones;   persistant   Hyperlipidemia    Hypertension    Iron deficiency    followed by [facility] w/ hx iron infusions   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   oncologist--- [facility];  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, active survillence   Lactose intolerance in adult 02/01/2016   Left ureteral calculus    Lumbar disc disease with radiculopathy    Nephrolithiasis    per CT 08-11-2021  left > right   Neuropathy, peripheral    Nocturia more than twice per night    Normocytic anemia    OSA (obstructive sleep apnea)    per study 2007;   per pt no Bipap use since 2021   Renal atrophy, bilateral    Renal cyst, right    Sciatica    Sigmoid diverticulosis    Type 2 diabetes mellitus (HCC)     Surgical History: Past Surgical History:  Procedure Laterality Date   BIOPSY N/A 03/17/2013   Procedure: GASTRIC BIOPSIES;  Surgeon: West Bali, MD;  [facility]: [facility];  Service: Endoscopy;  Laterality: N/A;   BIOPSY  06/10/2015   Procedure: BIOPSY;  Surgeon: West Bali, MD;  [facility];  Service: Endoscopy;;  gastric biopsy   CARDIAC CATHETERIZATION  05/11/2003   @ [facility] by dr t. Tresa Endo;  Abnormal cardiolite/   Normal coronary arteries and normal LVF,  ef  63%   CARDIOVASCULAR STRESS TEST  01/01/2014   normal lexiscan cardiolite/  no ischemia/ infarct/  normal LV function and wall motion ,  ef 81%   COLONOSCOPY WITH PROPOFOL N/A 02/26/2017   Procedure: COLONOSCOPY WITH PROPOFOL;  Surgeon: West Bali, MD;  [facility];  Service: Endoscopy;  Laterality: N/A;  11:00am   COLONOSCOPY WITH PROPOFOL N/A 09/22/2019   Procedure: COLONOSCOPY WITH PROPOFOL;  Surgeon: West Bali, MD;  [facility];  Service: Endoscopy;  Laterality: N/A;  1:15   CYSTO/   RIGHT URETEROSOCOPY LASER LITHOTRIPSY STONE EXTRACTION  10/13/2004   CYSTO/  RIGHT RETROGRADE PYELOGRAM/  PLACMENT RIGHT URETERAL STENT  01/10/2010   CYSTOSCOPY W/ URETERAL STENT PLACEMENT Right 08/19/2015   Procedure: CYSTOSCOPY WITH RETROGRADE PYELOGRAM/URETERAL STENT PLACEMENT;  Surgeon: Marcine Matar, MD;  [facility];  Service: Urology;  Laterality: Right;   CYSTOSCOPY W/ URETERAL STENT PLACEMENT Left 02/23/2020   Procedure: CYSTOSCOPY LEFT  RETROGRADE PYELOGRAM LEFT URETEROSCOPY URETERAL STENT PLACEMENT;  Surgeon: Marcine Matar, MD;  [facility]: [facility];  Service: Urology;  Laterality: Left;   CYSTOSCOPY W/ URETERAL STENT PLACEMENT Right 09/24/2022   Procedure: CYSTOSCOPY WITH RETROGRADE PYELOGRAM/URETERAL STENT PLACEMENT;  Surgeon: Malen Gauze, MD;  [facility];  Service: Urology;  Laterality: Right;   CYSTOSCOPY W/ URETERAL STENT PLACEMENT Right 10/25/2022   Procedure: RIGHT URETERAL STENT EXCHANGE;  Surgeon: Malen Gauze, MD;  [facility];  Service: Urology;  Laterality:  Right;   CYSTOSCOPY WITH RETROGRADE PYELOGRAM, URETEROSCOPY AND STENT PLACEMENT Left 10/21/2014   Procedure: CYSTOSCOPY WITH RETROGRADE PYELOGRAM, URETEROSCOPY AND STENT PLACEMENT;  Surgeon: Marcine Matar, MD;  Location: [facility];  Service: Urology;  Laterality: Left;   CYSTOSCOPY WITH RETROGRADE PYELOGRAM, URETEROSCOPY AND STENT PLACEMENT Right 11/22/2015   Procedure: CYSTOSCOPY, RIGHT URETERAL STENT REMOVAL; RIGHT RETROGRADE PYELOGRAM, RIGHT URETEROSCOPY WITH BALLOON DILATION; RIGHT URETERAL STENT PLACEMENT;  Surgeon: Marcine Matar, MD;  [facility];  Service: Urology;  Laterality: Right;   CYSTOSCOPY WITH RETROGRADE PYELOGRAM, URETEROSCOPY AND STENT PLACEMENT Left 10/18/2020   Procedure: CYSTOSCOPY WITH LEFT RETROGRADE PYELOGRAM, LEFT URETEROSCOPY  WITH LASER AND LEFT URETERAL STENT PLACEMENT;  Surgeon: Marcine Matar, MD;  [facility];  Service: Urology;  Laterality: Left;   CYSTOSCOPY WITH RETROGRADE PYELOGRAM, URETEROSCOPY AND STENT PLACEMENT Left 10/26/2021   Procedure: CYSTOSCOPY WITH RETROGRADE PYELOGRAM, URETEROSCOPY, STONE EXTRACTION  AND STENT PLACEMENT;  Surgeon: Marcine Matar, MD;  Location: [facility];  Service: Urology;  Laterality: Left;   CYSTOSCOPY WITH RETROGRADE PYELOGRAM, URETEROSCOPY AND STENT PLACEMENT Bilateral 10/25/2022   Procedure: CYSTOSCOPY WITH RETROGRADE PYELOGRAM, URETEROSCOPY;  Surgeon: Malen Gauze, MD;  [facility];  Service: Urology;  Laterality: Bilateral;   CYSTOSCOPY/RETROGRADE/URETEROSCOPY/STONE EXTRACTION WITH BASKET Bilateral 08/02/2015   Procedure: CYSTOSCOPY; BILATERAL RETROGRADE PYELOGRAMS; BILATERAL URETEROSCOPY, STONE EXTRACTION WITH BASKET;  Surgeon: Marcine Matar, MD;  [facility];  Service: Urology;  Laterality: Bilateral;   CYSTOSCOPY/RETROGRADE/URETEROSCOPY/STONE EXTRACTION WITH BASKET Right 06/28/2015   Procedure: CYSTOSCOPY/RETROGRADE/URETEROSCOPY/STONE EXTRACTION WITH  BASKET, RIGHT URETERAL DOUBLE J STENT PLACEMENT;  Surgeon: Marcine Matar, MD;  [facility];  Service: Urology;  Laterality: Right;   ESOPHAGOGASTRODUODENOSCOPY (EGD) WITH PROPOFOL N/A 03/17/2013   Procedure: ESOPHAGOGASTRODUODENOSCOPY (EGD) WITH PROPOFOL;  Surgeon: West Bali, MD;  [facility]: [facility];  Service: Endoscopy;  Laterality: N/A;   ESOPHAGOGASTRODUODENOSCOPY (EGD) WITH PROPOFOL N/A 06/10/2015   Distal gastritis, distal esophageal stricture s/p dilation   ESOPHAGOGASTRODUODENOSCOPY (EGD) WITH PROPOFOL N/A 02/26/2017   Procedure: ESOPHAGOGASTRODUODENOSCOPY (EGD) WITH PROPOFOL;  Surgeon: West Bali, MD;  [facility];  Service: Endoscopy;  Laterality: N/A;   FLEXIBLE SIGMOIDOSCOPY  09/11/2011   [id number] Internal hemorrhoids   HOLMIUM LASER APPLICATION Left 10/21/2014   Procedure: HOLMIUM LASER APPLICATION;  Surgeon: Marcine Matar, MD;  Location: [facility];  Service: Urology;  Laterality: Left;   HOLMIUM LASER APPLICATION Right 06/28/2015   Procedure: HOLMIUM LASER APPLICATION;  Surgeon: Marcine Matar, MD;  [facility];  Service: Urology;  Laterality: Right;   HOLMIUM LASER APPLICATION  02/23/2020   Procedure: HOLMIUM LASER APPLICATION;  Surgeon: Marcine Matar, MD;  [facility];  Service: Urology;;   HOLMIUM LASER APPLICATION Left 10/18/2020   Procedure: HOLMIUM LASER APPLICATION;  Surgeon: Marcine Matar, MD;  [facility];  Service: Urology;  Laterality: Left;  HOLMIUM LASER APPLICATION Right 10/25/2022   Procedure: HOLMIUM LASER APPLICATION;  Surgeon: Malen Gauze, MD;  [facility];  Service: Urology;  Laterality: Right;   MASS EXCISION Left 03/04/2015   Procedure: EXCISION OF SOFT TISSUE NEOPLASM LEFT ARM;  Surgeon: Franky Macho, MD;  Location: [facility];  Service: General;  Laterality: Left;   PERCUTANEOUS NEPHROSTOLITHOTOMY Bilateral 05/2010   @[facility]    POLYPECTOMY N/A 03/17/2013   Procedure:  GASTRIC POLYPECTOMY;  Surgeon: West Bali, MD;  [facility];  Service: Endoscopy;  Laterality: N/A;   POLYPECTOMY  02/26/2017   Procedure: POLYPECTOMY;  Surgeon: West Bali, MD;  [facility];  Service: Endoscopy;;  polyp at cecum, ascending colon polyps x3, hepatic flexure polyps x8, transverse colon polyps x8    POLYPECTOMY  09/22/2019   Procedure: POLYPECTOMY;  Surgeon: West Bali, MD;  [facility];  Service: Endoscopy;;   REMOVAL RIGHT THIGH CYST  2006   SAVORY DILATION N/A 03/17/2013   Procedure: SAVORY DILATION;  Surgeon: West Bali, MD;  [facility];  Service: Endoscopy;  Laterality: N/A;  #12.8, 14, 15, 16 dilators used   SAVORY DILATION N/A 06/10/2015   Procedure: SAVORY DILATION;  Surgeon: West Bali, MD;  [facility];  Service: Endoscopy;  Laterality: N/A;   VAGINAL HYSTERECTOMY  1970's    Home Medications:  Allergies as of 11/21/2022       Reactions   Ace Inhibitors Cough   Patient doesn't recall Other Reaction(s): GI Intolerance   Cephalexin Diarrhea, Nausea And Vomiting   Other Reaction(s): GI Intolerance   Nitrofurantoin Diarrhea, Nausea And Vomiting   Penicillins Hives, Other (See Comments)        Medication List        Accurate as of November 21, 2022 10:58 AM. If you have any questions, ask your nurse or doctor.          acetaminophen 500 MG tablet Commonly known as: TYLENOL Take 2 tablets (1,000 mg total) by mouth at bedtime.   amLODipine 10 MG tablet Commonly known as: NORVASC TAKE 1 TABLET BY MOUTH EVERY DAY   aspirin EC 81 MG tablet Take 81 mg by mouth daily. Swallow whole.   cyanocobalamin 500 MCG tablet Commonly known as: VITAMIN B12 Take 1 tablet (500 mcg total) by mouth daily.   diclofenac Sodium 1 % Gel Commonly known as: VOLTAREN APPLY 4 G TOPICALLY 4 (FOUR) TIMES DAILY AS NEEDED (PAIN).   diphenhydramine-acetaminophen 25-500 MG Tabs tablet Commonly known as: TYLENOL  PM Take 2 tablets by mouth at bedtime.   folic acid 1 MG tablet Commonly known as: FOLVITE Take 1 tablet (1 mg total) by mouth daily.   gabapentin 300 MG capsule Commonly known as: NEURONTIN TAKE 1 CAPSULE BY MOUTH THREE TIMES A DAY AS NEEDED   HYDROcodone-acetaminophen 5-325 MG tablet Commonly known as: Norco Take 1 tablet by mouth every 6 (six) hours as needed for moderate pain.   linagliptin 5 MG Tabs tablet Commonly known as: Tradjenta Take 1 tablet (5 mg total) by mouth daily.   nitroGLYCERIN 0.4 MG SL tablet Commonly known as: NITROSTAT Place 1 tablet (0.4 mg total) under the tongue every 5 (five) minutes as needed for chest pain. Up to 3 tablets, then call 911.   OneTouch Delica Lancets 30G Misc Once daily testing dx e11.9   OneTouch Ultra test strip Generic drug: glucose blood USE AS DIRECTED   pantoprazole 40 MG tablet Commonly known  as: PROTONIX TAKE 1 TABLET BY MOUTH TWICE A DAY   PARoxetine 40 MG tablet Commonly known as: PAXIL TAKE 1 TABLET (40 MG TOTAL) BY MOUTH IN THE MORNING   rosuvastatin 20 MG tablet Commonly known as: CRESTOR Take 20 mg by mouth daily.   sulfamethoxazole-trimethoprim 800-160 MG tablet Commonly known as: BACTRIM DS Take 1 tablet by mouth 2 (two) times daily.   Symbicort 80-4.5 MCG/ACT inhaler Generic drug: budesonide-formoterol INHALE 2 PUFFS INTO THE LUNGS TWICE A DAY What changed: See the new instructions.   traMADol 50 MG tablet Commonly known as: ULTRAM TAKE 1 TABLET BY MOUTH EVERY 6 HOURS AS NEEDED   Trulicity 0.75 MG/0.5ML Sopn Generic drug: Dulaglutide INJECT 0.75 MG INTO THE SKIN WEEKLY What changed: See the new instructions.   UNABLE TO FIND Diabetic shoes x 1 pair, inserts x 3 pair  DX E11.9        Allergies:  Allergies  Allergen Reactions   Ace Inhibitors Cough    Patient doesn't recall  Other Reaction(s): GI Intolerance   Cephalexin Diarrhea and Nausea And Vomiting    Other Reaction(s): GI  Intolerance   Nitrofurantoin Diarrhea and Nausea And Vomiting   Penicillins Hives and Other (See Comments)    Family History: Family History  Problem Relation Age of Onset   Hypertension Mother    Diabetes Mother    Heart failure Mother    Dementia Mother    Emphysema Father    Hypertension Father    Diabetes Brother    GER disease Brother    Hypertension Sister    Hypertension Sister    Cancer Other        family history    Diabetes Other        family history    Heart defect Other        famiily history    Arthritis Other        family history    Anesthesia problems Neg Hx    Hypotension Neg Hx    Malignant hyperthermia Neg Hx    Pseudochol deficiency Neg Hx    Colon cancer Neg Hx     Social History:  reports that she quit smoking about 34 years ago. Her smoking use included cigarettes. She started smoking about 48 years ago. She has a 20.00 pack-year smoking history. She has been exposed to tobacco smoke. She has never used smokeless tobacco. She reports that she does not drink alcohol and does not use drugs.  ROS: All other review of systems were reviewed and are negative except what is noted above in HPI  Physical Exam: BP 119/71   Pulse (!) 109   Ht 5\' 6"  (1.676 m)   Wt 181 lb 3.2 oz (82.2 kg)   BMI 29.25 kg/m   Constitutional:  Alert and oriented, No acute distress. HEENT: [place] AT, moist mucus membranes.  Trachea midline, no masses. Cardiovascular: No clubbing, cyanosis, or edema. Respiratory: Normal respiratory effort, no increased work of breathing. GI: Abdomen is soft, nontender, nondistended, no abdominal masses GU: No CVA tenderness.  Lymph: No cervical or inguinal lymphadenopathy. Skin: No rashes, bruises or suspicious lesions. Neurologic: Grossly intact, no focal deficits, moving all 4 extremities. Psychiatric: Normal mood and affect.  Laboratory Data: Lab Results  Component Value Date   WBC 7.6 10/19/2022   HGB 10.3 (L) 10/19/2022   HCT 32.6  (L) 10/19/2022   MCV 93.9 10/19/2022   PLT 247 10/19/2022    Lab Results  Component Value  Date   CREATININE 1.44 (H) 10/19/2022    No results found for: "PSA"  No results found for: "TESTOSTERONE"  Lab Results  Component Value Date   HGBA1C 6.3 (H) 07/10/2022    Urinalysis    Component Value Date/Time   COLORURINE YELLOW 10/17/2022 1658   APPEARANCEUR CLOUDY (A) 10/17/2022 1658   APPEARANCEUR Clear 01/11/2022 1408   LABSPEC 1.012 10/17/2022 1658   PHURINE 7.0 10/17/2022 1658   GLUCOSEU NEGATIVE 10/17/2022 1658   HGBUR LARGE (A) 10/17/2022 1658   HGBUR moderate 02/07/2010 0907   BILIRUBINUR NEGATIVE 10/17/2022 1658   BILIRUBINUR negative 10/12/2022 0953   BILIRUBINUR Negative 01/11/2022 1408   KETONESUR NEGATIVE 10/17/2022 1658   PROTEINUR 100 (A) 10/17/2022 1658   UROBILINOGEN 0.2 10/12/2022 0953   UROBILINOGEN 0.2 03/01/2015 1600   NITRITE NEGATIVE 10/17/2022 1658   LEUKOCYTESUR LARGE (A) 10/17/2022 1658    Lab Results  Component Value Date   LABMICR 37.7 03/20/2022   WBCUA 11-30 (A) 01/11/2022   LABEPIT 0-10 01/11/2022   MUCUS Present 01/11/2022   BACTERIA NONE SEEN 10/17/2022    Pertinent Imaging:  Results for orders placed during the hospital encounter of 12/13/20  DG Abd 1 View  Narrative CLINICAL DATA:  Kidney stone status post removal. Question migrated stent.  EXAM: ABDOMEN - 1 VIEW  COMPARISON:  X-ray abdomen 09/28/2020,  FINDINGS: Left ureteral stent with proximal pigtail overlying the expected region of the renal shadow and distal pigtail overlying the expected region of the urinary bladder. The bowel gas pattern is normal. No radio-opaque calculi or other significant radiographic abnormality are seen.  IMPRESSION: Left ureteral stent with proximal pigtail overlying the expected region of the left renal shadow and distal pigtail overlying the expected region of the urinary bladder. Markedly limited evaluation of exact positioning  due to bowel gas overlying the abdomen and pelvis as well as no post stent placement comparison images. If continued concern, recommend CT pelvis for further evaluation.   Electronically Signed By: Tish Frederickson M.D. On: 12/13/2020 21:09  No results found for this or any previous visit.  No results found for this or any previous visit.  No results found for this or any previous visit.  Results for orders placed during the hospital encounter of 12/05/21  US RENAL  Narrative CLINICAL DATA:  Follow-up prior bilateral renal stones.  EXAM: RENAL / URINARY TRACT ULTRASOUND COMPLETE  COMPARISON:  CT scan of the abdomen and pelvis November 19, 2021. Renal ultrasound July 05, 2021.  FINDINGS: Right Kidney:  Renal measurements: 11.8 x 5.7 x 5.1 cm = volume: 177 mL. There is a 1.8 cm simple cyst in the right kidney. No follow-up imaging recommended for the cyst. No obvious renal stones identified.  Left Kidney:  Renal measurements: 12.4 x 5.6 x 6.0 cm = volume: 215 mL. Moderate hydronephrosis persists, similar in the interval. No renal stones identified. There appears to be a 6 mm stone in the left ureter.  Bladder:  Appears normal for degree of bladder distention.  Other:  None.  IMPRESSION: 1. The right kidney is unremarkable with no obvious stones. No hydronephrosis. 2. Stable left hydronephrosis on the left. No renal stones identified in the left kidney on this study. 3. Apparent 6 mm stone in the left ureter. 4. No other abnormalities.   Electronically Signed By: Gerome Sam III M.D. On: 12/06/2021 09:08  No valid procedures specified. No results found for this or any previous visit.  Results for orders placed during  the hospital encounter of 10/17/22  CT RENAL STONE STUDY  Narrative CLINICAL DATA:  Left lower quadrant abdominal/flank pain. Recent urinary tract infection.  EXAM: CT ABDOMEN AND PELVIS WITHOUT  CONTRAST  TECHNIQUE: Multidetector CT imaging of the abdomen and pelvis was performed following the standard protocol without IV contrast.  RADIATION DOSE REDUCTION: This exam was performed according to the departmental dose-optimization program which includes automated exposure control, adjustment of the mA and/or kV according to patient size and/or use of iterative reconstruction technique.  COMPARISON:  CT 09/23/2022, 02/13/2022  FINDINGS: Lower chest: Bandlike subsegmental atelectasis in the right middle and lower lobe related to elevated hemidiaphragm. 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 No acute airspace disease.  Hepatobiliary: Unremarkable unenhanced appearance of the liver. Gallbladder physiologically distended, no calcified stone. No biliary dilatation.  Pancreas: No ductal dilatation or inflammation.  Spleen: Normal in size without focal abnormality.  Adrenals/Urinary Tract: No adrenal nodule. Placement of right nephroureteral stent since prior exam. Proximal pigtails coiled in the dilated renal pelvis, the distal pigtail is coiled in the bladder. 3 mm stone fragment adjacent to the distal stent series 2, image 72. There is diminished right hydronephrosis from prior exam with persistent dilatation of the renal pelvis and ureter. Mild background thinning of right renal parenchyma. There is no evidence of renal fluid collection. Hypodense right renal lesion on prior exam is not well seen in the absence of IV contrast.  Progressive left hydroureteronephrosis from prior. There is a punctate stone in the distal left ureter, series 5, image 77 causing proximal obstruction. Multiple additional punctate nonobstructing left intrarenal calculi. There is mild thinning of the left renal parenchyma.  Stomach/Bowel: Unremarkable appearance of the stomach. Small duodenal diverticulum. There is no bowel obstruction or inflammation. Normal appendix. Left colonic  diverticulosis, most prominent in the sigmoid. No diverticulitis.  Vascular/Lymphatic: Mild aortic atherosclerosis. No aneurysm. No enlarged lymph nodes in the abdomen or pelvis.  Reproductive: Status post hysterectomy. No adnexal masses.  Other: No free air or free fluid. Scattered subcutaneous densities in the lower anterior abdominal wall are typical of medication injection sites. Tiny fat containing umbilical hernia. There is minimal fat in both inguinal canals.  Musculoskeletal: Lumbar facet hypertrophy. There are no acute or suspicious osseous abnormalities. Left gluteal subcutaneous granulomas.  IMPRESSION: 1. Punctate distal left ureteric stone causing moderate proximal hydroureteronephrosis. Increased hydronephrosis from prior exam. Multiple additional punctate nonobstructing left intrarenal calculi. 2. Placement of right nephroureteral stent since prior exam. Diminished right hydronephrosis with persistent renal pelvis dilatation. 3 mm stone is seen adjacent to the distal portion of the stent. 3. Colonic diverticulosis without diverticulitis.  Aortic Atherosclerosis (ICD10-I70.0).   Electronically Signed By: Narda Rutherford M.D. On: 10/17/2022 15:06   Assessment & Plan:    1. Hydronephrosis with renal and ureteral calculus obstruction -followup 3 months with renal US - Urinalysis, Routine w reflex microscopic   No follow-ups on file.  Wilkie Aye, MD  [facility] [place]

## 2022-11-22 LAB — CMP14+EGFR
ALT: 15 IU/L (ref 0–32)
AST: 17 IU/L (ref 0–40)
Albumin/Globulin Ratio: 1.3
Albumin: 4.5 g/dL (ref 3.8–4.8)
Alkaline Phosphatase: 96 IU/L (ref 44–121)
BUN/Creatinine Ratio: 8 — ABNORMAL LOW (ref 12–28)
BUN: 10 mg/dL (ref 8–27)
Bilirubin Total: <0.2 mg/dL (ref 0.0–1.2)
CO2: 22 mmol/L (ref 20–29)
Calcium: 9.7 mg/dL (ref 8.7–10.3)
Chloride: 103 mmol/L (ref 96–106)
Creatinine, Ser: 1.29 mg/dL — ABNORMAL HIGH (ref 0.57–1.00)
Globulin, Total: 3.6 g/dL (ref 1.5–4.5)
Glucose: 107 mg/dL — ABNORMAL HIGH (ref 70–99)
Potassium: 3.9 mmol/L (ref 3.5–5.2)
Sodium: 141 mmol/L (ref 134–144)
Total Protein: 8.1 g/dL (ref 6.0–8.5)
eGFR: 42 mL/min/{1.73_m2} — ABNORMAL LOW (ref >59)

## 2022-11-22 LAB — HEMOGLOBIN A1C
Est. average glucose Bld gHb Est-mCnc: 134 mg/dL
Hgb A1c MFr Bld: 6.3 % — ABNORMAL HIGH (ref 4.8–5.6)

## 2022-11-22 LAB — LIPID PANEL
Chol/HDL Ratio: 2.7 ratio (ref 0.0–4.4)
Cholesterol, Total: 125 mg/dL (ref 100–199)
HDL: 47 mg/dL (ref >39)
LDL Chol Calc (NIH): 60 mg/dL (ref 0–99)
Triglycerides: 95 mg/dL (ref 0–149)
VLDL Cholesterol Cal: 18 mg/dL (ref 5–40)

## 2022-11-23 DIAGNOSIS — A419 Sepsis, unspecified organism: Secondary | ICD-10-CM | POA: Diagnosis not present

## 2022-11-23 DIAGNOSIS — M25511 Pain in right shoulder: Secondary | ICD-10-CM | POA: Diagnosis not present

## 2022-11-23 DIAGNOSIS — Z7982 Long term (current) use of aspirin: Secondary | ICD-10-CM | POA: Diagnosis not present

## 2022-11-23 DIAGNOSIS — E1122 Type 2 diabetes mellitus with diabetic chronic kidney disease: Secondary | ICD-10-CM | POA: Diagnosis not present

## 2022-11-23 DIAGNOSIS — C9000 Multiple myeloma not having achieved remission: Secondary | ICD-10-CM | POA: Diagnosis not present

## 2022-11-23 DIAGNOSIS — N1832 Chronic kidney disease, stage 3b: Secondary | ICD-10-CM | POA: Diagnosis not present

## 2022-11-23 DIAGNOSIS — E1142 Type 2 diabetes mellitus with diabetic polyneuropathy: Secondary | ICD-10-CM | POA: Diagnosis not present

## 2022-11-23 DIAGNOSIS — M5116 Intervertebral disc disorders with radiculopathy, lumbar region: Secondary | ICD-10-CM | POA: Diagnosis not present

## 2022-11-23 DIAGNOSIS — G8929 Other chronic pain: Secondary | ICD-10-CM | POA: Diagnosis not present

## 2022-11-23 DIAGNOSIS — M25512 Pain in left shoulder: Secondary | ICD-10-CM | POA: Diagnosis not present

## 2022-11-23 DIAGNOSIS — M5430 Sciatica, unspecified side: Secondary | ICD-10-CM | POA: Diagnosis not present

## 2022-11-23 DIAGNOSIS — K5730 Diverticulosis of large intestine without perforation or abscess without bleeding: Secondary | ICD-10-CM | POA: Diagnosis not present

## 2022-11-23 DIAGNOSIS — E785 Hyperlipidemia, unspecified: Secondary | ICD-10-CM | POA: Diagnosis not present

## 2022-11-23 DIAGNOSIS — G4733 Obstructive sleep apnea (adult) (pediatric): Secondary | ICD-10-CM | POA: Diagnosis not present

## 2022-11-23 DIAGNOSIS — I129 Hypertensive chronic kidney disease with stage 1 through stage 4 chronic kidney disease, or unspecified chronic kidney disease: Secondary | ICD-10-CM | POA: Diagnosis not present

## 2022-11-23 DIAGNOSIS — Z7985 Long-term (current) use of injectable non-insulin antidiabetic drugs: Secondary | ICD-10-CM | POA: Diagnosis not present

## 2022-11-23 DIAGNOSIS — Z79891 Long term (current) use of opiate analgesic: Secondary | ICD-10-CM | POA: Diagnosis not present

## 2022-11-23 DIAGNOSIS — K219 Gastro-esophageal reflux disease without esophagitis: Secondary | ICD-10-CM | POA: Diagnosis not present

## 2022-11-23 DIAGNOSIS — M1990 Unspecified osteoarthritis, unspecified site: Secondary | ICD-10-CM | POA: Diagnosis not present

## 2022-11-23 DIAGNOSIS — E611 Iron deficiency: Secondary | ICD-10-CM | POA: Diagnosis not present

## 2022-11-23 DIAGNOSIS — N136 Pyonephrosis: Secondary | ICD-10-CM | POA: Diagnosis not present

## 2022-11-23 DIAGNOSIS — E1151 Type 2 diabetes mellitus with diabetic peripheral angiopathy without gangrene: Secondary | ICD-10-CM | POA: Diagnosis not present

## 2022-11-23 DIAGNOSIS — F32A Depression, unspecified: Secondary | ICD-10-CM | POA: Diagnosis not present

## 2022-11-27 ENCOUNTER — Encounter: Payer: Self-pay | Admitting: Urology

## 2022-11-27 ENCOUNTER — Encounter: Payer: Self-pay | Admitting: Family Medicine

## 2022-11-27 NOTE — Assessment & Plan Note (Signed)
Stable, managed by Oncology

## 2022-11-27 NOTE — Progress Notes (Signed)
Cheryl Abbott     MRN: [id number]      DOB: 1946/04/03  Chief Complaint  Patient presents with   Follow-up    Follow up    HPI Cheryl Abbott is here for follow up and re-evaluation of chronic medical conditions, medication management and review of any available recent lab and radiology data.  Preventive health is updated, specifically  Cancer screening and Immunization.   Questions or concerns regarding consultations or procedures which the PT has had in the interim are  addressed. The PT denies any adverse reactions to current medications since the last visit.  There are no new concerns.  There are no specific complaints   ROS Denies recent fever or chills. Denies sinus pressure, nasal congestion, ear pain or sore throat. Denies chest congestion, productive cough or wheezing. Denies chest pains, palpitations and leg swelling Denies abdominal pain, nausea, vomiting,diarrhea or constipation.   Denies dysuria, frequency, hesitancy or incontinence. Denies joint pain, swelling and limitation in mobility. Denies headaches, seizures, numbness, or tingling. Denies depression, anxiety or insomnia. Denies skin break down or rash.   PE  BP 113/72 (BP Location: Right Arm, Patient Position: Sitting, Cuff Size: Large)   Pulse (!) 102   Ht 5\' 6"  (1.676 m)   Wt 181 lb 1.9 oz (82.2 kg)   SpO2 91%   BMI 29.23 kg/m   Patient alert and oriented and in no cardiopulmonary distress.  HEENT: No facial asymmetry, EOMI,     Neck supple .  Chest: Clear to auscultation bilaterally.  CVS: S1, S2 no murmurs, no S3.Regular rate.  ABD: Soft non tender.   Ext: No edema  MS: Adequate ROM spine, shoulders, hips and knees.  Skin: Intact, no ulcerations or rash noted.  Psych: Good eye contact, normal affect. Memory intact not anxious or depressed appearing.  CNS: CN 2-12 intact, power,  normal throughout.no focal deficits noted.   Assessment & Plan  Type 2 diabetes with nephropathy  (HCC) Controlled, no change in management Cheryl Abbott is reminded of the importance of commitment to daily physical activity for 30 minutes or more, as able and the need to limit carbohydrate intake to 30 to 60 grams per meal to help with blood sugar control.   Cheryl Abbott is reminded of the importance of daily foot exam, annual eye examination, and good blood sugar, blood pressure and cholesterol control.     Latest Ref Rng & Units 11/21/2022    8:00 AM 10/19/2022    4:09 AM 10/18/2022    5:05 AM 10/17/2022    1:34 PM 10/11/2022   11:09 AM  Diabetic Labs  HbA1c 4.8 - 5.6 % 6.3       Chol 100 - 199 mg/dL 956       HDL >21 mg/dL 47       Calc LDL 0 - 99 mg/dL 60       Triglycerides 0 - 149 mg/dL 95       Creatinine 3.08 - 1.00 mg/dL 6.57  8.46  9.62  9.52  1.50       11/21/2022   10:34 AM 11/20/2022   10:24 AM 10/25/2022   11:37 AM 10/25/2022   11:16 AM 10/25/2022   11:01 AM 10/25/2022   10:45 AM 10/25/2022   10:35 AM  BP/Weight  Systolic BP 119 113 119 121 114 133 128  Diastolic BP 71 72 74 75 86 80 65  Wt. (Lbs) 181.2 181.12       BMI  29.25 kg/m2 29.23 kg/m2           Latest Ref Rng & Units 08/20/2022   12:00 AM 08/15/2021   12:00 AM  Foot/eye exam completion dates  Eye Exam No Retinopathy No Retinopathy     No Retinopathy         This result is from an external source.        Hypertension goal BP (blood pressure) < 130/80 Controlled, no change in medication DASH diet and commitment to daily physical activity for a minimum of 30 minutes discussed and encouraged, as a part of hypertension management. The importance of attaining a healthy weight is also discussed.     11/21/2022   10:34 AM 11/20/2022   10:24 AM 10/25/2022   11:37 AM 10/25/2022   11:16 AM 10/25/2022   11:01 AM 10/25/2022   10:45 AM 10/25/2022   10:35 AM  BP/Weight  Systolic BP 119 113 119 121 114 133 128  Diastolic BP 71 72 74 75 86 80 65  Wt. (Lbs) 181.2 181.12       BMI 29.25 kg/m2 29.23 kg/m2             Hyperlipidemia LDL goal <100 Hyperlipidemia:Low fat diet discussed and encouraged.   Lipid Panel  Lab Results  Component Value Date   CHOL 125 11/21/2022   HDL 47 11/21/2022   LDLCALC 60 11/21/2022   TRIG 95 11/21/2022   CHOLHDL 2.7 11/21/2022     Controlled, no change in medication   Generalized anxiety disorder Controlled, no change in medication   Overweight (BMI 25.0-29.9)  Patient re-educated about  the importance of commitment to a  minimum of 150 minutes of exercise per week as able.  The importance of healthy food choices with portion control discussed, as well as eating regularly and within a 12 hour window most days. The need to choose "clean , green" food 50 to 75% of the time is discussed, as well as to make water the primary drink and set a goal of 64 ounces water daily.       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

## 2022-11-27 NOTE — Assessment & Plan Note (Signed)
  Patient re-educated about  the importance of commitment to a  minimum of 150 minutes of exercise per week as able.  The importance of healthy food choices with portion control discussed, as well as eating regularly and within a 12 hour window most days. The need to choose "clean , green" food 50 to 75% of the time is discussed, as well as to make water the primary drink and set a goal of 64 ounces water daily.       11/21/2022   10:34 AM 11/20/2022   10:24 AM 10/25/2022    7:59 AM  Weight /BMI  Weight 181 lb 3.2 oz 181 lb 1.9 oz 182 lb 5.1 oz  Height 5\' 6"  (1.676 m) 5\' 6"  (1.676 m) 5\' 6"  (1.676 m)  BMI 29.25 kg/m2 29.23 kg/m2 29.43 kg/m2

## 2022-11-27 NOTE — Assessment & Plan Note (Signed)
Hyperlipidemia:Low fat diet discussed and encouraged.   Lipid Panel  Lab Results  Component Value Date   CHOL 125 11/21/2022   HDL 47 11/21/2022   LDLCALC 60 11/21/2022   TRIG 95 11/21/2022   CHOLHDL 2.7 11/21/2022     Controlled, no change in medication

## 2022-11-27 NOTE — Assessment & Plan Note (Signed)
Controlled, no change in medication DASH diet and commitment to daily physical activity for a minimum of 30 minutes discussed and encouraged, as a part of hypertension management. The importance of attaining a healthy weight is also discussed.     11/21/2022   10:34 AM 11/20/2022   10:24 AM 10/25/2022   11:37 AM 10/25/2022   11:16 AM 10/25/2022   11:01 AM 10/25/2022   10:45 AM 10/25/2022   10:35 AM  BP/Weight  Systolic BP 119 113 119 121 114 133 128  Diastolic BP 71 72 74 75 86 80 65  Wt. (Lbs) 181.2 181.12       BMI 29.25 kg/m2 29.23 kg/m2

## 2022-11-27 NOTE — Patient Instructions (Signed)
Dietary Guidelines to Help Prevent Kidney Stones Kidney stones are deposits of minerals and salts that form inside your kidneys. Your risk of developing kidney stones may be greater depending on your diet, your lifestyle, the medicines you take, and whether you have certain medical conditions. Most people can lower their risks of developing kidney stones by following these dietary guidelines. Your dietitian may give you more specific instructions depending on your overall health and the type of kidney stones you tend to develop. What are tips for following this plan? Reading food labels  Choose foods with "no salt added" or "low-salt" labels. Limit your salt (sodium) intake to less than 1,500 mg a day. Choose foods with calcium for each meal and snack. Try to eat about 300 mg of calcium at each meal. Foods that contain 200-500 mg of calcium a serving include: 8 oz (237 mL) of milk, calcium-fortifiednon-dairy milk, and calcium-fortifiedfruit juice. Calcium-fortified means that calcium has been added to these drinks. 8 oz (237 mL) of kefir, yogurt, and soy yogurt. 4 oz (114 g) of tofu. 1 oz (28 g) of cheese. 1 cup (150 g) of dried figs. 1 cup (91 g) of cooked broccoli. One 3 oz (85 g) can of sardines or mackerel. Most people need 1,000-1,500 mg of calcium a day. Talk to your dietitian about how much calcium is recommended for you. Shopping Buy plenty of fresh fruits and vegetables. Most people do not need to avoid fruits and vegetables, even if these foods contain nutrients that may contribute to kidney stones. When shopping for convenience foods, choose: Whole pieces of fruit. Pre-made salads with dressing on the side. Low-fat fruit and yogurt smoothies. Avoid buying frozen meals or prepared deli foods. These can be high in sodium. Look for foods with live cultures, such as yogurt and kefir. Choose high-fiber grains, such as whole-wheat breads, oat bran, and wheat cereals. Cooking Do not add  salt to food when cooking. Place a salt shaker on the table and allow each person to add their own salt to taste. Use vegetable protein, such as beans, textured vegetable protein (TVP), or tofu, instead of meat in pasta, casseroles, and soups. Meal planning Eat less salt, if told by your dietitian. To do this: Avoid eating processed or pre-made food. Avoid eating fast food. Eat less animal protein, including cheese, meat, poultry, or fish, if told by your dietitian. To do this: Limit the number of times you have meat, poultry, fish, or cheese each week. Eat a diet free of meat at least 2 days a week. Eat only one serving each day of meat, poultry, fish, or seafood. When you prepare animal proteins, cut pieces into small portion sizes. For most meat and fish, one serving is about the size of the palm of your hand. Eat at least five servings of fresh fruits and vegetables each day. To do this: Keep fruits and vegetables on hand for snacks. Eat one piece of fruit or a handful of berries with breakfast. Have a salad and fruit at lunch. Have two kinds of vegetables at dinner. You may be told to limit foods that are high in a substance called oxalate. These include: Spinach (cooked), rhubarb, beets, sweet potatoes, and Swiss chard. Peanuts. Potato chips, french fries, and baked potatoes with skin on. Nuts and nut products. Chocolate. If you regularly take a diuretic medicine, make sure to eat at least 1 or 2 servings of fruits or vegetables that are high in potassium each day. These include: Avocado.   Banana. Orange, prune, carrot, or tomato juice. Baked potato. Cabbage. Beans and split peas. Lifestyle  Drink enough fluid to keep your urine pale yellow. This is the most important thing you can do. Spread your fluid intake throughout the day. If you drink alcohol: Limit how much you have to: 0-1 drink a day for women who are not pregnant. 0-2 drinks a day for men. Know how much alcohol is  in your drink. In the [place] one drink equals one 12 oz bottle of beer (355 mL), one 5 oz glass of wine (148 mL), or one 1 oz glass of hard liquor (44 mL). Lose weight if told by your health care provider. Work with your dietitian to find an eating plan and weight loss strategies that work best for you. General information Talk to your health care provider and dietitian about taking daily supplements. Depending on your health and the cause of your kidney stones, you may be told: Do not take high-dose supplements of vitamin C (1,000 mg a day or more). To take a calcium supplement. To take a daily probiotic supplement. To take other supplements such as magnesium, fish oil, or vitamin B6. Take over-the-counter and prescription medicines only as told by your health care provider. These include supplements. What foods should I limit? Limit your intake of the following foods, or eat them as told by your dietitian. Vegetables Spinach. Rhubarb. Beets. Canned vegetables. Pickles. Olives. Baked potatoes with skin. Grains Wheat bran. Baked goods. Salted crackers. Cereals high in sugar. Meats and other proteins Nuts. Nut butters. Large portions of meat, poultry, or fish. Salted, precooked, or cured meats, such as sausages, meat loaves, and hot dogs. Dairy Cheeses. Beverages Regular soft drinks. Regular vegetable juice. Seasonings and condiments Seasoning blends with salt. Salad dressings. Soy sauce. Ketchup. Barbecue sauce. Other foods Canned soups. Canned pasta sauce. Casseroles. Pizza. Lasagna. Frozen meals. Potato chips. French fries. The items listed above may not be a complete list of foods and beverages you should limit. Contact a dietitian for more information. What foods should I avoid? Talk to your dietitian about specific foods you should avoid based on the type of kidney stones you have and your overall health. Fruits Grapefruit. The item listed above may not be a complete list of foods  and beverages you should avoid. Contact a dietitian for more information. Summary Kidney stones are deposits of minerals and salts that form inside your kidneys. You can lower your risk of kidney stones by making changes to your diet. The most important thing you can do is drink enough fluid. Drink enough fluid to keep your urine pale yellow. Talk to your dietitian about how much calcium you should have each day, and eat less salt and animal protein as told by your dietitian. This information is not intended to replace advice given to you by your health care provider. Make sure you discuss any questions you have with your health care provider. Document Revised: 09/07/2021 Document Reviewed: 09/07/2021 Elsevier Patient Education  2024 [employer]  

## 2022-11-27 NOTE — Assessment & Plan Note (Addendum)
Controlled, no change in management Cheryl Abbott is reminded of the importance of commitment to daily physical activity for 30 minutes or more, as able and the need to limit carbohydrate intake to 30 to 60 grams per meal to help with blood sugar control.   Cheryl Abbott is reminded of the importance of daily foot exam, annual eye examination, and good blood sugar, blood pressure and cholesterol control.     Latest Ref Rng & Units 11/21/2022    8:00 AM 10/19/2022    4:09 AM 10/18/2022    5:05 AM 10/17/2022    1:34 PM 10/11/2022   11:09 AM  Diabetic Labs  HbA1c 4.8 - 5.6 % 6.3       Chol 100 - 199 mg/dL 161       HDL >09 mg/dL 47       Calc LDL 0 - 99 mg/dL 60       Triglycerides 0 - 149 mg/dL 95       Creatinine 6.04 - 1.00 mg/dL 5.40  9.81  1.91  4.78  1.50       11/21/2022   10:34 AM 11/20/2022   10:24 AM 10/25/2022   11:37 AM 10/25/2022   11:16 AM 10/25/2022   11:01 AM 10/25/2022   10:45 AM 10/25/2022   10:35 AM  BP/Weight  Systolic BP 119 113 119 121 114 133 128  Diastolic BP 71 72 74 75 86 80 65  Wt. (Lbs) 181.2 181.12       BMI 29.25 kg/m2 29.23 kg/m2           Latest Ref Rng & Units 08/20/2022   12:00 AM 08/15/2021   12:00 AM  Foot/eye exam completion dates  Eye Exam No Retinopathy No Retinopathy     No Retinopathy         This result is from an external source.

## 2022-11-27 NOTE — Assessment & Plan Note (Signed)
Controlled, no change in medication  

## 2022-11-27 NOTE — Assessment & Plan Note (Addendum)
unControlled, no change in medication, recent stress of kidney stone with obstruction and infection the triggers

## 2022-11-27 NOTE — Assessment & Plan Note (Signed)
Compliant 

## 2022-11-29 DIAGNOSIS — Z7985 Long-term (current) use of injectable non-insulin antidiabetic drugs: Secondary | ICD-10-CM | POA: Diagnosis not present

## 2022-11-29 DIAGNOSIS — E611 Iron deficiency: Secondary | ICD-10-CM | POA: Diagnosis not present

## 2022-11-29 DIAGNOSIS — E1151 Type 2 diabetes mellitus with diabetic peripheral angiopathy without gangrene: Secondary | ICD-10-CM | POA: Diagnosis not present

## 2022-11-29 DIAGNOSIS — M1990 Unspecified osteoarthritis, unspecified site: Secondary | ICD-10-CM | POA: Diagnosis not present

## 2022-11-29 DIAGNOSIS — C9000 Multiple myeloma not having achieved remission: Secondary | ICD-10-CM | POA: Diagnosis not present

## 2022-11-29 DIAGNOSIS — F32A Depression, unspecified: Secondary | ICD-10-CM | POA: Diagnosis not present

## 2022-11-29 DIAGNOSIS — E1122 Type 2 diabetes mellitus with diabetic chronic kidney disease: Secondary | ICD-10-CM | POA: Diagnosis not present

## 2022-11-29 DIAGNOSIS — E1142 Type 2 diabetes mellitus with diabetic polyneuropathy: Secondary | ICD-10-CM | POA: Diagnosis not present

## 2022-11-29 DIAGNOSIS — G8929 Other chronic pain: Secondary | ICD-10-CM | POA: Diagnosis not present

## 2022-11-29 DIAGNOSIS — N2889 Other specified disorders of kidney and ureter: Secondary | ICD-10-CM | POA: Diagnosis not present

## 2022-11-29 DIAGNOSIS — Z7984 Long term (current) use of oral hypoglycemic drugs: Secondary | ICD-10-CM | POA: Diagnosis not present

## 2022-11-29 DIAGNOSIS — K219 Gastro-esophageal reflux disease without esophagitis: Secondary | ICD-10-CM | POA: Diagnosis not present

## 2022-11-29 DIAGNOSIS — N136 Pyonephrosis: Secondary | ICD-10-CM | POA: Diagnosis not present

## 2022-11-29 DIAGNOSIS — E785 Hyperlipidemia, unspecified: Secondary | ICD-10-CM | POA: Diagnosis not present

## 2022-11-29 DIAGNOSIS — I129 Hypertensive chronic kidney disease with stage 1 through stage 4 chronic kidney disease, or unspecified chronic kidney disease: Secondary | ICD-10-CM | POA: Diagnosis not present

## 2022-11-29 DIAGNOSIS — Z7982 Long term (current) use of aspirin: Secondary | ICD-10-CM | POA: Diagnosis not present

## 2022-11-29 DIAGNOSIS — K5730 Diverticulosis of large intestine without perforation or abscess without bleeding: Secondary | ICD-10-CM | POA: Diagnosis not present

## 2022-11-29 DIAGNOSIS — Z79891 Long term (current) use of opiate analgesic: Secondary | ICD-10-CM | POA: Diagnosis not present

## 2022-11-29 DIAGNOSIS — G4733 Obstructive sleep apnea (adult) (pediatric): Secondary | ICD-10-CM | POA: Diagnosis not present

## 2022-11-29 DIAGNOSIS — N1832 Chronic kidney disease, stage 3b: Secondary | ICD-10-CM | POA: Diagnosis not present

## 2022-11-29 DIAGNOSIS — M5430 Sciatica, unspecified side: Secondary | ICD-10-CM | POA: Diagnosis not present

## 2022-11-29 DIAGNOSIS — M5116 Intervertebral disc disorders with radiculopathy, lumbar region: Secondary | ICD-10-CM | POA: Diagnosis not present

## 2022-12-03 ENCOUNTER — Other Ambulatory Visit: Payer: Self-pay | Admitting: Orthopedic Surgery

## 2022-12-03 DIAGNOSIS — G8929 Other chronic pain: Secondary | ICD-10-CM

## 2022-12-03 DIAGNOSIS — M25512 Pain in left shoulder: Secondary | ICD-10-CM

## 2022-12-06 DIAGNOSIS — K5730 Diverticulosis of large intestine without perforation or abscess without bleeding: Secondary | ICD-10-CM | POA: Diagnosis not present

## 2022-12-06 DIAGNOSIS — Z7985 Long-term (current) use of injectable non-insulin antidiabetic drugs: Secondary | ICD-10-CM | POA: Diagnosis not present

## 2022-12-06 DIAGNOSIS — N1832 Chronic kidney disease, stage 3b: Secondary | ICD-10-CM | POA: Diagnosis not present

## 2022-12-06 DIAGNOSIS — E1151 Type 2 diabetes mellitus with diabetic peripheral angiopathy without gangrene: Secondary | ICD-10-CM | POA: Diagnosis not present

## 2022-12-06 DIAGNOSIS — K219 Gastro-esophageal reflux disease without esophagitis: Secondary | ICD-10-CM | POA: Diagnosis not present

## 2022-12-06 DIAGNOSIS — F32A Depression, unspecified: Secondary | ICD-10-CM | POA: Diagnosis not present

## 2022-12-06 DIAGNOSIS — M5116 Intervertebral disc disorders with radiculopathy, lumbar region: Secondary | ICD-10-CM | POA: Diagnosis not present

## 2022-12-06 DIAGNOSIS — M5430 Sciatica, unspecified side: Secondary | ICD-10-CM | POA: Diagnosis not present

## 2022-12-06 DIAGNOSIS — N2889 Other specified disorders of kidney and ureter: Secondary | ICD-10-CM | POA: Diagnosis not present

## 2022-12-06 DIAGNOSIS — E1122 Type 2 diabetes mellitus with diabetic chronic kidney disease: Secondary | ICD-10-CM | POA: Diagnosis not present

## 2022-12-06 DIAGNOSIS — G4733 Obstructive sleep apnea (adult) (pediatric): Secondary | ICD-10-CM | POA: Diagnosis not present

## 2022-12-06 DIAGNOSIS — C9000 Multiple myeloma not having achieved remission: Secondary | ICD-10-CM | POA: Diagnosis not present

## 2022-12-06 DIAGNOSIS — I129 Hypertensive chronic kidney disease with stage 1 through stage 4 chronic kidney disease, or unspecified chronic kidney disease: Secondary | ICD-10-CM | POA: Diagnosis not present

## 2022-12-06 DIAGNOSIS — N136 Pyonephrosis: Secondary | ICD-10-CM | POA: Diagnosis not present

## 2022-12-06 DIAGNOSIS — E785 Hyperlipidemia, unspecified: Secondary | ICD-10-CM | POA: Diagnosis not present

## 2022-12-06 DIAGNOSIS — Z79891 Long term (current) use of opiate analgesic: Secondary | ICD-10-CM | POA: Diagnosis not present

## 2022-12-06 DIAGNOSIS — E1142 Type 2 diabetes mellitus with diabetic polyneuropathy: Secondary | ICD-10-CM | POA: Diagnosis not present

## 2022-12-06 DIAGNOSIS — E611 Iron deficiency: Secondary | ICD-10-CM | POA: Diagnosis not present

## 2022-12-06 DIAGNOSIS — G8929 Other chronic pain: Secondary | ICD-10-CM | POA: Diagnosis not present

## 2022-12-06 DIAGNOSIS — Z7982 Long term (current) use of aspirin: Secondary | ICD-10-CM | POA: Diagnosis not present

## 2022-12-06 DIAGNOSIS — Z7984 Long term (current) use of oral hypoglycemic drugs: Secondary | ICD-10-CM | POA: Diagnosis not present

## 2022-12-06 DIAGNOSIS — M1990 Unspecified osteoarthritis, unspecified site: Secondary | ICD-10-CM | POA: Diagnosis not present

## 2022-12-11 DIAGNOSIS — N2889 Other specified disorders of kidney and ureter: Secondary | ICD-10-CM | POA: Diagnosis not present

## 2022-12-11 DIAGNOSIS — M5116 Intervertebral disc disorders with radiculopathy, lumbar region: Secondary | ICD-10-CM | POA: Diagnosis not present

## 2022-12-11 DIAGNOSIS — K5730 Diverticulosis of large intestine without perforation or abscess without bleeding: Secondary | ICD-10-CM | POA: Diagnosis not present

## 2022-12-11 DIAGNOSIS — C9000 Multiple myeloma not having achieved remission: Secondary | ICD-10-CM | POA: Diagnosis not present

## 2022-12-11 DIAGNOSIS — E1151 Type 2 diabetes mellitus with diabetic peripheral angiopathy without gangrene: Secondary | ICD-10-CM | POA: Diagnosis not present

## 2022-12-11 DIAGNOSIS — E611 Iron deficiency: Secondary | ICD-10-CM | POA: Diagnosis not present

## 2022-12-11 DIAGNOSIS — G4733 Obstructive sleep apnea (adult) (pediatric): Secondary | ICD-10-CM | POA: Diagnosis not present

## 2022-12-11 DIAGNOSIS — K219 Gastro-esophageal reflux disease without esophagitis: Secondary | ICD-10-CM | POA: Diagnosis not present

## 2022-12-11 DIAGNOSIS — M1990 Unspecified osteoarthritis, unspecified site: Secondary | ICD-10-CM | POA: Diagnosis not present

## 2022-12-11 DIAGNOSIS — E785 Hyperlipidemia, unspecified: Secondary | ICD-10-CM | POA: Diagnosis not present

## 2022-12-11 DIAGNOSIS — Z7984 Long term (current) use of oral hypoglycemic drugs: Secondary | ICD-10-CM | POA: Diagnosis not present

## 2022-12-11 DIAGNOSIS — Z79891 Long term (current) use of opiate analgesic: Secondary | ICD-10-CM | POA: Diagnosis not present

## 2022-12-11 DIAGNOSIS — G8929 Other chronic pain: Secondary | ICD-10-CM | POA: Diagnosis not present

## 2022-12-11 DIAGNOSIS — E1142 Type 2 diabetes mellitus with diabetic polyneuropathy: Secondary | ICD-10-CM | POA: Diagnosis not present

## 2022-12-11 DIAGNOSIS — N136 Pyonephrosis: Secondary | ICD-10-CM | POA: Diagnosis not present

## 2022-12-11 DIAGNOSIS — N1832 Chronic kidney disease, stage 3b: Secondary | ICD-10-CM | POA: Diagnosis not present

## 2022-12-11 DIAGNOSIS — E1122 Type 2 diabetes mellitus with diabetic chronic kidney disease: Secondary | ICD-10-CM | POA: Diagnosis not present

## 2022-12-11 DIAGNOSIS — Z7982 Long term (current) use of aspirin: Secondary | ICD-10-CM | POA: Diagnosis not present

## 2022-12-11 DIAGNOSIS — I129 Hypertensive chronic kidney disease with stage 1 through stage 4 chronic kidney disease, or unspecified chronic kidney disease: Secondary | ICD-10-CM | POA: Diagnosis not present

## 2022-12-11 DIAGNOSIS — F32A Depression, unspecified: Secondary | ICD-10-CM | POA: Diagnosis not present

## 2022-12-11 DIAGNOSIS — Z7985 Long-term (current) use of injectable non-insulin antidiabetic drugs: Secondary | ICD-10-CM | POA: Diagnosis not present

## 2022-12-11 DIAGNOSIS — M5430 Sciatica, unspecified side: Secondary | ICD-10-CM | POA: Diagnosis not present

## 2022-12-18 DIAGNOSIS — R0902 Hypoxemia: Secondary | ICD-10-CM | POA: Diagnosis not present

## 2022-12-21 DIAGNOSIS — F32A Depression, unspecified: Secondary | ICD-10-CM | POA: Diagnosis not present

## 2022-12-21 DIAGNOSIS — E1151 Type 2 diabetes mellitus with diabetic peripheral angiopathy without gangrene: Secondary | ICD-10-CM | POA: Diagnosis not present

## 2022-12-21 DIAGNOSIS — Z7982 Long term (current) use of aspirin: Secondary | ICD-10-CM | POA: Diagnosis not present

## 2022-12-21 DIAGNOSIS — Z79891 Long term (current) use of opiate analgesic: Secondary | ICD-10-CM | POA: Diagnosis not present

## 2022-12-21 DIAGNOSIS — Z7985 Long-term (current) use of injectable non-insulin antidiabetic drugs: Secondary | ICD-10-CM | POA: Diagnosis not present

## 2022-12-21 DIAGNOSIS — G4733 Obstructive sleep apnea (adult) (pediatric): Secondary | ICD-10-CM | POA: Diagnosis not present

## 2022-12-21 DIAGNOSIS — E1122 Type 2 diabetes mellitus with diabetic chronic kidney disease: Secondary | ICD-10-CM | POA: Diagnosis not present

## 2022-12-21 DIAGNOSIS — G8929 Other chronic pain: Secondary | ICD-10-CM | POA: Diagnosis not present

## 2022-12-21 DIAGNOSIS — C9000 Multiple myeloma not having achieved remission: Secondary | ICD-10-CM | POA: Diagnosis not present

## 2022-12-21 DIAGNOSIS — E611 Iron deficiency: Secondary | ICD-10-CM | POA: Diagnosis not present

## 2022-12-21 DIAGNOSIS — E1142 Type 2 diabetes mellitus with diabetic polyneuropathy: Secondary | ICD-10-CM | POA: Diagnosis not present

## 2022-12-21 DIAGNOSIS — K219 Gastro-esophageal reflux disease without esophagitis: Secondary | ICD-10-CM | POA: Diagnosis not present

## 2022-12-21 DIAGNOSIS — E785 Hyperlipidemia, unspecified: Secondary | ICD-10-CM | POA: Diagnosis not present

## 2022-12-21 DIAGNOSIS — I129 Hypertensive chronic kidney disease with stage 1 through stage 4 chronic kidney disease, or unspecified chronic kidney disease: Secondary | ICD-10-CM | POA: Diagnosis not present

## 2022-12-21 DIAGNOSIS — K5730 Diverticulosis of large intestine without perforation or abscess without bleeding: Secondary | ICD-10-CM | POA: Diagnosis not present

## 2022-12-21 DIAGNOSIS — N2889 Other specified disorders of kidney and ureter: Secondary | ICD-10-CM | POA: Diagnosis not present

## 2022-12-21 DIAGNOSIS — N1832 Chronic kidney disease, stage 3b: Secondary | ICD-10-CM | POA: Diagnosis not present

## 2022-12-21 DIAGNOSIS — Z7984 Long term (current) use of oral hypoglycemic drugs: Secondary | ICD-10-CM | POA: Diagnosis not present

## 2022-12-21 DIAGNOSIS — N136 Pyonephrosis: Secondary | ICD-10-CM | POA: Diagnosis not present

## 2022-12-21 DIAGNOSIS — M5116 Intervertebral disc disorders with radiculopathy, lumbar region: Secondary | ICD-10-CM | POA: Diagnosis not present

## 2022-12-21 DIAGNOSIS — M5430 Sciatica, unspecified side: Secondary | ICD-10-CM | POA: Diagnosis not present

## 2022-12-21 DIAGNOSIS — M1990 Unspecified osteoarthritis, unspecified site: Secondary | ICD-10-CM | POA: Diagnosis not present

## 2022-12-24 DIAGNOSIS — E1142 Type 2 diabetes mellitus with diabetic polyneuropathy: Secondary | ICD-10-CM | POA: Diagnosis not present

## 2022-12-24 DIAGNOSIS — E611 Iron deficiency: Secondary | ICD-10-CM | POA: Diagnosis not present

## 2022-12-24 DIAGNOSIS — G4733 Obstructive sleep apnea (adult) (pediatric): Secondary | ICD-10-CM | POA: Diagnosis not present

## 2022-12-24 DIAGNOSIS — E1151 Type 2 diabetes mellitus with diabetic peripheral angiopathy without gangrene: Secondary | ICD-10-CM | POA: Diagnosis not present

## 2022-12-24 DIAGNOSIS — Z79891 Long term (current) use of opiate analgesic: Secondary | ICD-10-CM | POA: Diagnosis not present

## 2022-12-24 DIAGNOSIS — C9000 Multiple myeloma not having achieved remission: Secondary | ICD-10-CM | POA: Diagnosis not present

## 2022-12-24 DIAGNOSIS — M5116 Intervertebral disc disorders with radiculopathy, lumbar region: Secondary | ICD-10-CM | POA: Diagnosis not present

## 2022-12-24 DIAGNOSIS — N1832 Chronic kidney disease, stage 3b: Secondary | ICD-10-CM | POA: Diagnosis not present

## 2022-12-24 DIAGNOSIS — K219 Gastro-esophageal reflux disease without esophagitis: Secondary | ICD-10-CM | POA: Diagnosis not present

## 2022-12-24 DIAGNOSIS — N2889 Other specified disorders of kidney and ureter: Secondary | ICD-10-CM | POA: Diagnosis not present

## 2022-12-24 DIAGNOSIS — N136 Pyonephrosis: Secondary | ICD-10-CM | POA: Diagnosis not present

## 2022-12-24 DIAGNOSIS — G8929 Other chronic pain: Secondary | ICD-10-CM | POA: Diagnosis not present

## 2022-12-24 DIAGNOSIS — E1122 Type 2 diabetes mellitus with diabetic chronic kidney disease: Secondary | ICD-10-CM | POA: Diagnosis not present

## 2022-12-24 DIAGNOSIS — K5730 Diverticulosis of large intestine without perforation or abscess without bleeding: Secondary | ICD-10-CM | POA: Diagnosis not present

## 2022-12-24 DIAGNOSIS — F32A Depression, unspecified: Secondary | ICD-10-CM | POA: Diagnosis not present

## 2022-12-24 DIAGNOSIS — Z7984 Long term (current) use of oral hypoglycemic drugs: Secondary | ICD-10-CM | POA: Diagnosis not present

## 2022-12-24 DIAGNOSIS — Z7985 Long-term (current) use of injectable non-insulin antidiabetic drugs: Secondary | ICD-10-CM | POA: Diagnosis not present

## 2022-12-24 DIAGNOSIS — Z7982 Long term (current) use of aspirin: Secondary | ICD-10-CM | POA: Diagnosis not present

## 2022-12-24 DIAGNOSIS — E785 Hyperlipidemia, unspecified: Secondary | ICD-10-CM | POA: Diagnosis not present

## 2022-12-24 DIAGNOSIS — M1990 Unspecified osteoarthritis, unspecified site: Secondary | ICD-10-CM | POA: Diagnosis not present

## 2022-12-24 DIAGNOSIS — I129 Hypertensive chronic kidney disease with stage 1 through stage 4 chronic kidney disease, or unspecified chronic kidney disease: Secondary | ICD-10-CM | POA: Diagnosis not present

## 2022-12-24 DIAGNOSIS — M5430 Sciatica, unspecified side: Secondary | ICD-10-CM | POA: Diagnosis not present

## 2022-12-27 ENCOUNTER — Other Ambulatory Visit: Payer: Self-pay | Admitting: Family Medicine

## 2022-12-27 ENCOUNTER — Other Ambulatory Visit: Payer: Self-pay | Admitting: Orthopedic Surgery

## 2022-12-27 DIAGNOSIS — G8929 Other chronic pain: Secondary | ICD-10-CM

## 2022-12-29 ENCOUNTER — Other Ambulatory Visit: Payer: Self-pay | Admitting: Family Medicine

## 2022-12-29 ENCOUNTER — Other Ambulatory Visit: Payer: Self-pay | Admitting: Orthopedic Surgery

## 2022-12-29 DIAGNOSIS — M25512 Pain in left shoulder: Secondary | ICD-10-CM

## 2023-01-03 DIAGNOSIS — M5430 Sciatica, unspecified side: Secondary | ICD-10-CM | POA: Diagnosis not present

## 2023-01-03 DIAGNOSIS — G8929 Other chronic pain: Secondary | ICD-10-CM | POA: Diagnosis not present

## 2023-01-03 DIAGNOSIS — N1832 Chronic kidney disease, stage 3b: Secondary | ICD-10-CM | POA: Diagnosis not present

## 2023-01-03 DIAGNOSIS — E785 Hyperlipidemia, unspecified: Secondary | ICD-10-CM | POA: Diagnosis not present

## 2023-01-03 DIAGNOSIS — M1990 Unspecified osteoarthritis, unspecified site: Secondary | ICD-10-CM | POA: Diagnosis not present

## 2023-01-03 DIAGNOSIS — C9000 Multiple myeloma not having achieved remission: Secondary | ICD-10-CM | POA: Diagnosis not present

## 2023-01-03 DIAGNOSIS — I129 Hypertensive chronic kidney disease with stage 1 through stage 4 chronic kidney disease, or unspecified chronic kidney disease: Secondary | ICD-10-CM | POA: Diagnosis not present

## 2023-01-03 DIAGNOSIS — Z79891 Long term (current) use of opiate analgesic: Secondary | ICD-10-CM | POA: Diagnosis not present

## 2023-01-03 DIAGNOSIS — N136 Pyonephrosis: Secondary | ICD-10-CM | POA: Diagnosis not present

## 2023-01-03 DIAGNOSIS — Z7984 Long term (current) use of oral hypoglycemic drugs: Secondary | ICD-10-CM | POA: Diagnosis not present

## 2023-01-03 DIAGNOSIS — M5116 Intervertebral disc disorders with radiculopathy, lumbar region: Secondary | ICD-10-CM | POA: Diagnosis not present

## 2023-01-03 DIAGNOSIS — G4733 Obstructive sleep apnea (adult) (pediatric): Secondary | ICD-10-CM | POA: Diagnosis not present

## 2023-01-03 DIAGNOSIS — E1122 Type 2 diabetes mellitus with diabetic chronic kidney disease: Secondary | ICD-10-CM | POA: Diagnosis not present

## 2023-01-03 DIAGNOSIS — N2889 Other specified disorders of kidney and ureter: Secondary | ICD-10-CM | POA: Diagnosis not present

## 2023-01-03 DIAGNOSIS — K5730 Diverticulosis of large intestine without perforation or abscess without bleeding: Secondary | ICD-10-CM | POA: Diagnosis not present

## 2023-01-03 DIAGNOSIS — Z7982 Long term (current) use of aspirin: Secondary | ICD-10-CM | POA: Diagnosis not present

## 2023-01-03 DIAGNOSIS — E611 Iron deficiency: Secondary | ICD-10-CM | POA: Diagnosis not present

## 2023-01-03 DIAGNOSIS — Z7985 Long-term (current) use of injectable non-insulin antidiabetic drugs: Secondary | ICD-10-CM | POA: Diagnosis not present

## 2023-01-03 DIAGNOSIS — K219 Gastro-esophageal reflux disease without esophagitis: Secondary | ICD-10-CM | POA: Diagnosis not present

## 2023-01-03 DIAGNOSIS — E1142 Type 2 diabetes mellitus with diabetic polyneuropathy: Secondary | ICD-10-CM | POA: Diagnosis not present

## 2023-01-03 DIAGNOSIS — E1151 Type 2 diabetes mellitus with diabetic peripheral angiopathy without gangrene: Secondary | ICD-10-CM | POA: Diagnosis not present

## 2023-01-10 DIAGNOSIS — E611 Iron deficiency: Secondary | ICD-10-CM | POA: Diagnosis not present

## 2023-01-10 DIAGNOSIS — C9000 Multiple myeloma not having achieved remission: Secondary | ICD-10-CM | POA: Diagnosis not present

## 2023-01-10 DIAGNOSIS — N2889 Other specified disorders of kidney and ureter: Secondary | ICD-10-CM | POA: Diagnosis not present

## 2023-01-10 DIAGNOSIS — N136 Pyonephrosis: Secondary | ICD-10-CM | POA: Diagnosis not present

## 2023-01-10 DIAGNOSIS — M5116 Intervertebral disc disorders with radiculopathy, lumbar region: Secondary | ICD-10-CM | POA: Diagnosis not present

## 2023-01-10 DIAGNOSIS — E1142 Type 2 diabetes mellitus with diabetic polyneuropathy: Secondary | ICD-10-CM | POA: Diagnosis not present

## 2023-01-10 DIAGNOSIS — E1122 Type 2 diabetes mellitus with diabetic chronic kidney disease: Secondary | ICD-10-CM | POA: Diagnosis not present

## 2023-01-10 DIAGNOSIS — Z7985 Long-term (current) use of injectable non-insulin antidiabetic drugs: Secondary | ICD-10-CM | POA: Diagnosis not present

## 2023-01-10 DIAGNOSIS — G8929 Other chronic pain: Secondary | ICD-10-CM | POA: Diagnosis not present

## 2023-01-10 DIAGNOSIS — G4733 Obstructive sleep apnea (adult) (pediatric): Secondary | ICD-10-CM | POA: Diagnosis not present

## 2023-01-10 DIAGNOSIS — E785 Hyperlipidemia, unspecified: Secondary | ICD-10-CM | POA: Diagnosis not present

## 2023-01-10 DIAGNOSIS — M5430 Sciatica, unspecified side: Secondary | ICD-10-CM | POA: Diagnosis not present

## 2023-01-10 DIAGNOSIS — K219 Gastro-esophageal reflux disease without esophagitis: Secondary | ICD-10-CM | POA: Diagnosis not present

## 2023-01-10 DIAGNOSIS — Z7984 Long term (current) use of oral hypoglycemic drugs: Secondary | ICD-10-CM | POA: Diagnosis not present

## 2023-01-10 DIAGNOSIS — Z79891 Long term (current) use of opiate analgesic: Secondary | ICD-10-CM | POA: Diagnosis not present

## 2023-01-10 DIAGNOSIS — E1151 Type 2 diabetes mellitus with diabetic peripheral angiopathy without gangrene: Secondary | ICD-10-CM | POA: Diagnosis not present

## 2023-01-10 DIAGNOSIS — N1832 Chronic kidney disease, stage 3b: Secondary | ICD-10-CM | POA: Diagnosis not present

## 2023-01-10 DIAGNOSIS — I129 Hypertensive chronic kidney disease with stage 1 through stage 4 chronic kidney disease, or unspecified chronic kidney disease: Secondary | ICD-10-CM | POA: Diagnosis not present

## 2023-01-10 DIAGNOSIS — M1990 Unspecified osteoarthritis, unspecified site: Secondary | ICD-10-CM | POA: Diagnosis not present

## 2023-01-10 DIAGNOSIS — K5730 Diverticulosis of large intestine without perforation or abscess without bleeding: Secondary | ICD-10-CM | POA: Diagnosis not present

## 2023-01-10 DIAGNOSIS — Z7982 Long term (current) use of aspirin: Secondary | ICD-10-CM | POA: Diagnosis not present

## 2023-01-18 DIAGNOSIS — G4733 Obstructive sleep apnea (adult) (pediatric): Secondary | ICD-10-CM | POA: Diagnosis not present

## 2023-01-18 DIAGNOSIS — M5430 Sciatica, unspecified side: Secondary | ICD-10-CM | POA: Diagnosis not present

## 2023-01-18 DIAGNOSIS — I129 Hypertensive chronic kidney disease with stage 1 through stage 4 chronic kidney disease, or unspecified chronic kidney disease: Secondary | ICD-10-CM | POA: Diagnosis not present

## 2023-01-18 DIAGNOSIS — E785 Hyperlipidemia, unspecified: Secondary | ICD-10-CM | POA: Diagnosis not present

## 2023-01-18 DIAGNOSIS — K5730 Diverticulosis of large intestine without perforation or abscess without bleeding: Secondary | ICD-10-CM | POA: Diagnosis not present

## 2023-01-18 DIAGNOSIS — N1832 Chronic kidney disease, stage 3b: Secondary | ICD-10-CM | POA: Diagnosis not present

## 2023-01-18 DIAGNOSIS — E1142 Type 2 diabetes mellitus with diabetic polyneuropathy: Secondary | ICD-10-CM | POA: Diagnosis not present

## 2023-01-18 DIAGNOSIS — Z7985 Long-term (current) use of injectable non-insulin antidiabetic drugs: Secondary | ICD-10-CM | POA: Diagnosis not present

## 2023-01-18 DIAGNOSIS — R0902 Hypoxemia: Secondary | ICD-10-CM | POA: Diagnosis not present

## 2023-01-18 DIAGNOSIS — E611 Iron deficiency: Secondary | ICD-10-CM | POA: Diagnosis not present

## 2023-01-18 DIAGNOSIS — E1151 Type 2 diabetes mellitus with diabetic peripheral angiopathy without gangrene: Secondary | ICD-10-CM | POA: Diagnosis not present

## 2023-01-18 DIAGNOSIS — M5116 Intervertebral disc disorders with radiculopathy, lumbar region: Secondary | ICD-10-CM | POA: Diagnosis not present

## 2023-01-18 DIAGNOSIS — N136 Pyonephrosis: Secondary | ICD-10-CM | POA: Diagnosis not present

## 2023-01-18 DIAGNOSIS — G8929 Other chronic pain: Secondary | ICD-10-CM | POA: Diagnosis not present

## 2023-01-18 DIAGNOSIS — E1122 Type 2 diabetes mellitus with diabetic chronic kidney disease: Secondary | ICD-10-CM | POA: Diagnosis not present

## 2023-01-18 DIAGNOSIS — N2889 Other specified disorders of kidney and ureter: Secondary | ICD-10-CM | POA: Diagnosis not present

## 2023-01-18 DIAGNOSIS — M1990 Unspecified osteoarthritis, unspecified site: Secondary | ICD-10-CM | POA: Diagnosis not present

## 2023-01-18 DIAGNOSIS — K219 Gastro-esophageal reflux disease without esophagitis: Secondary | ICD-10-CM | POA: Diagnosis not present

## 2023-01-18 DIAGNOSIS — C9000 Multiple myeloma not having achieved remission: Secondary | ICD-10-CM | POA: Diagnosis not present

## 2023-01-18 DIAGNOSIS — Z79891 Long term (current) use of opiate analgesic: Secondary | ICD-10-CM | POA: Diagnosis not present

## 2023-01-18 DIAGNOSIS — Z7982 Long term (current) use of aspirin: Secondary | ICD-10-CM | POA: Diagnosis not present

## 2023-01-18 DIAGNOSIS — Z7984 Long term (current) use of oral hypoglycemic drugs: Secondary | ICD-10-CM | POA: Diagnosis not present

## 2023-01-23 ENCOUNTER — Other Ambulatory Visit: Payer: Self-pay | Admitting: Orthopedic Surgery

## 2023-01-23 DIAGNOSIS — M25512 Pain in left shoulder: Secondary | ICD-10-CM

## 2023-01-24 DIAGNOSIS — N2889 Other specified disorders of kidney and ureter: Secondary | ICD-10-CM | POA: Diagnosis not present

## 2023-01-24 DIAGNOSIS — Z7985 Long-term (current) use of injectable non-insulin antidiabetic drugs: Secondary | ICD-10-CM | POA: Diagnosis not present

## 2023-01-24 DIAGNOSIS — Z7984 Long term (current) use of oral hypoglycemic drugs: Secondary | ICD-10-CM | POA: Diagnosis not present

## 2023-01-24 DIAGNOSIS — K5730 Diverticulosis of large intestine without perforation or abscess without bleeding: Secondary | ICD-10-CM | POA: Diagnosis not present

## 2023-01-24 DIAGNOSIS — N136 Pyonephrosis: Secondary | ICD-10-CM | POA: Diagnosis not present

## 2023-01-24 DIAGNOSIS — N1832 Chronic kidney disease, stage 3b: Secondary | ICD-10-CM | POA: Diagnosis not present

## 2023-01-24 DIAGNOSIS — M5116 Intervertebral disc disorders with radiculopathy, lumbar region: Secondary | ICD-10-CM | POA: Diagnosis not present

## 2023-01-24 DIAGNOSIS — E611 Iron deficiency: Secondary | ICD-10-CM | POA: Diagnosis not present

## 2023-01-24 DIAGNOSIS — Z79891 Long term (current) use of opiate analgesic: Secondary | ICD-10-CM | POA: Diagnosis not present

## 2023-01-24 DIAGNOSIS — M5430 Sciatica, unspecified side: Secondary | ICD-10-CM | POA: Diagnosis not present

## 2023-01-24 DIAGNOSIS — I129 Hypertensive chronic kidney disease with stage 1 through stage 4 chronic kidney disease, or unspecified chronic kidney disease: Secondary | ICD-10-CM | POA: Diagnosis not present

## 2023-01-24 DIAGNOSIS — G8929 Other chronic pain: Secondary | ICD-10-CM | POA: Diagnosis not present

## 2023-01-24 DIAGNOSIS — E1151 Type 2 diabetes mellitus with diabetic peripheral angiopathy without gangrene: Secondary | ICD-10-CM | POA: Diagnosis not present

## 2023-01-24 DIAGNOSIS — M1990 Unspecified osteoarthritis, unspecified site: Secondary | ICD-10-CM | POA: Diagnosis not present

## 2023-01-24 DIAGNOSIS — E785 Hyperlipidemia, unspecified: Secondary | ICD-10-CM | POA: Diagnosis not present

## 2023-01-24 DIAGNOSIS — K219 Gastro-esophageal reflux disease without esophagitis: Secondary | ICD-10-CM | POA: Diagnosis not present

## 2023-01-24 DIAGNOSIS — E1122 Type 2 diabetes mellitus with diabetic chronic kidney disease: Secondary | ICD-10-CM | POA: Diagnosis not present

## 2023-01-24 DIAGNOSIS — G4733 Obstructive sleep apnea (adult) (pediatric): Secondary | ICD-10-CM | POA: Diagnosis not present

## 2023-01-24 DIAGNOSIS — C9000 Multiple myeloma not having achieved remission: Secondary | ICD-10-CM | POA: Diagnosis not present

## 2023-01-24 DIAGNOSIS — E1142 Type 2 diabetes mellitus with diabetic polyneuropathy: Secondary | ICD-10-CM | POA: Diagnosis not present

## 2023-01-24 DIAGNOSIS — Z7982 Long term (current) use of aspirin: Secondary | ICD-10-CM | POA: Diagnosis not present

## 2023-01-27 IMAGING — MR MR CERVICAL SPINE W/O CM
5 series · 33 of 48 positions shown · non-contrast
Comparison: None.

CLINICAL DATA: Neck pain radiating into the right arm

EXAM:
MRI CERVICAL SPINE WITHOUT CONTRAST
TECHNIQUE: Multiplanar, multisequence MR imaging of the cervical spine was
performed. No intravenous contrast was administered.

[Series 5: t2_tse_sag_fast · sagittal · 3.0mm · 0.43mm/px · 6 of 15 slices shown]
[im 1/15]
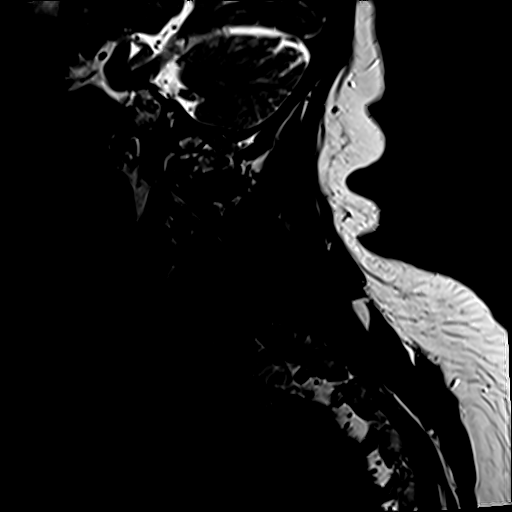
[im 3/15]
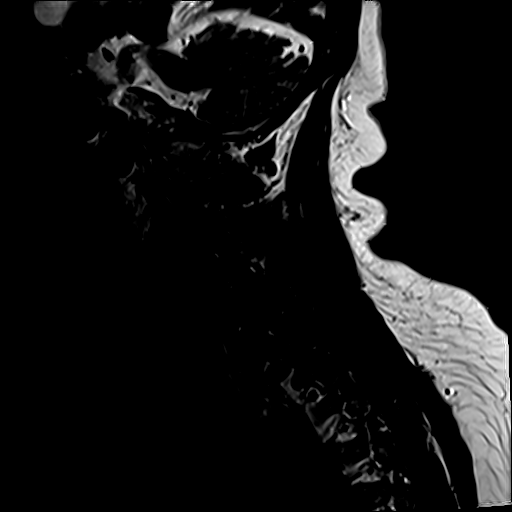
[im 6/15]
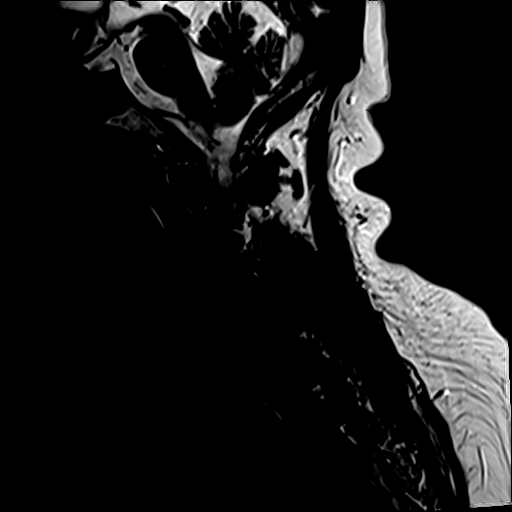
[im 9/15]
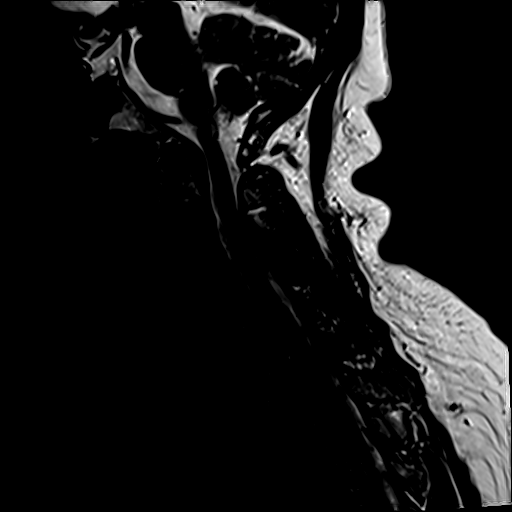
[im 12/15]
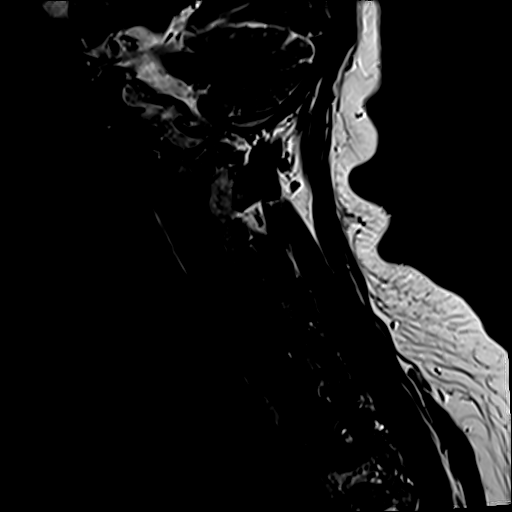
[im 15/15]
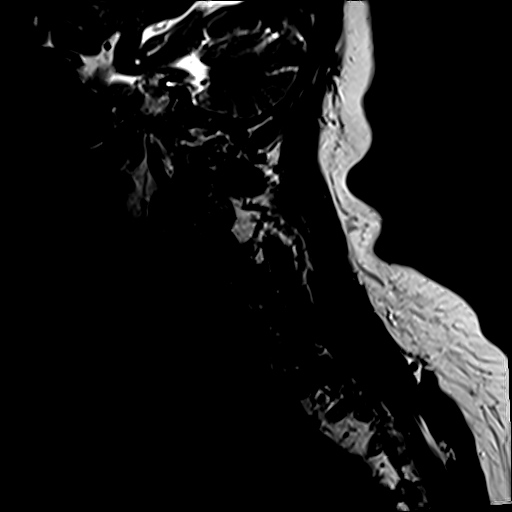

[Series 6: t1_tse_sag_fast · sagittal · 3.0mm · 0.43mm/px · 7 of 15 slices shown]
[im 1/15]
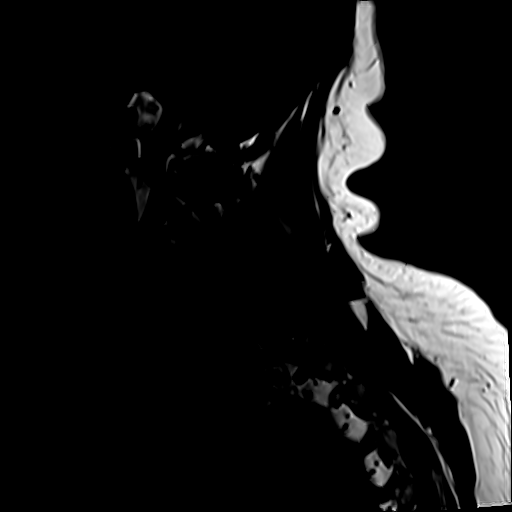
[im 3/15]
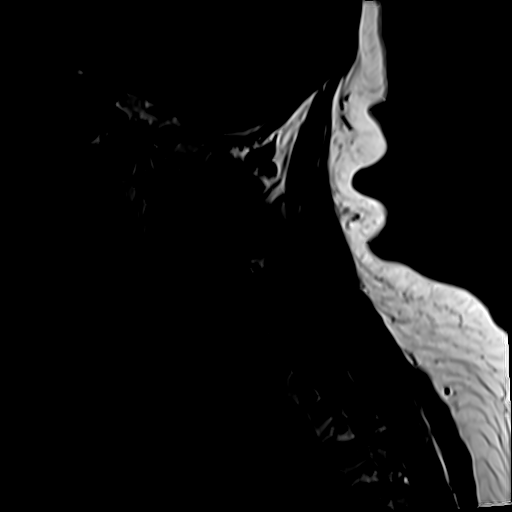
[im 5/15]
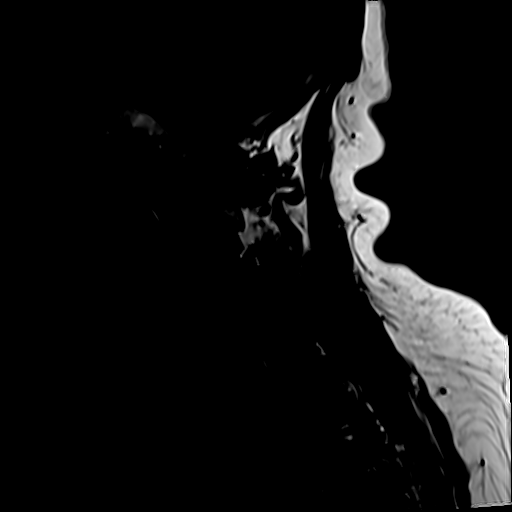
[im 8/15]
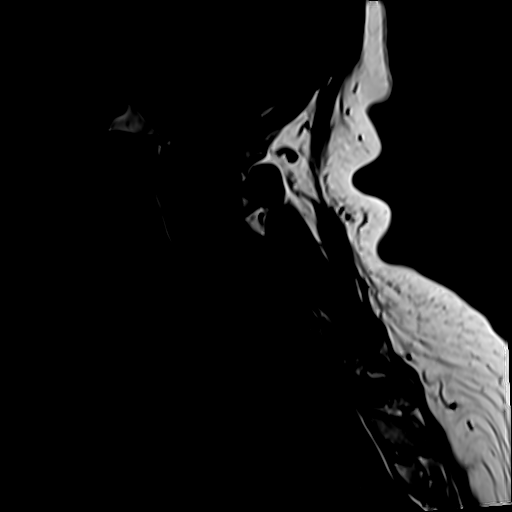
[im 10/15]
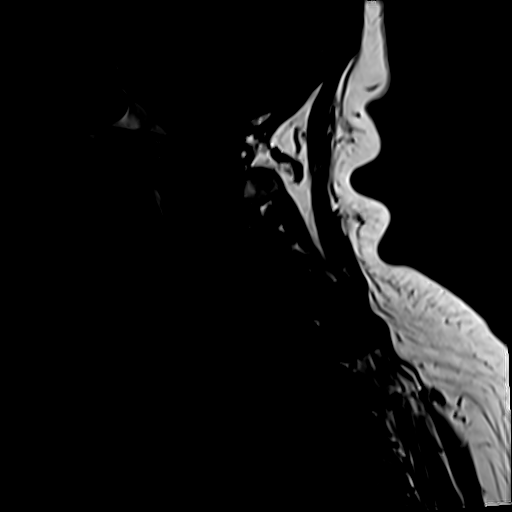
[im 12/15]
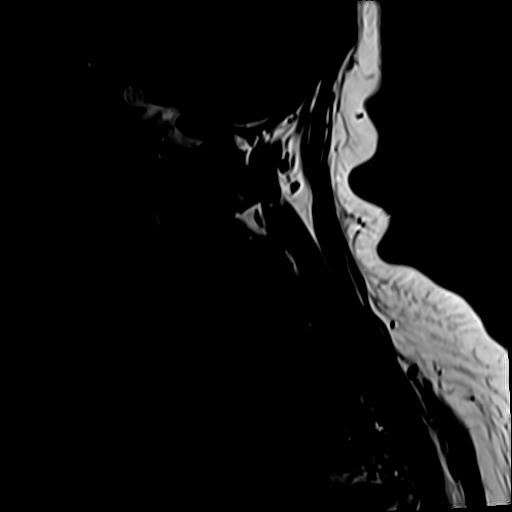
[im 15/15]
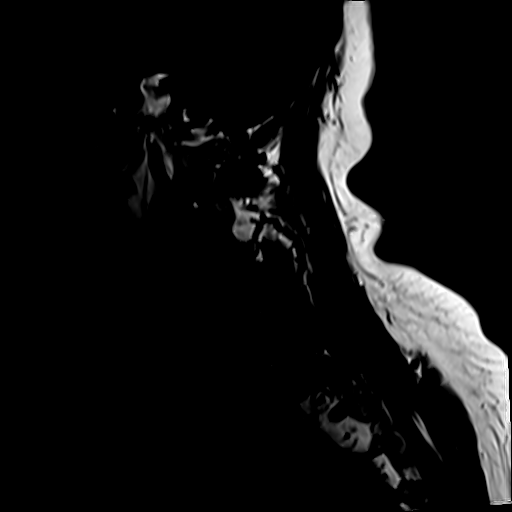

[Series 7: STIR · sagittal · 3.0mm · 0.86mm/px · 7 of 15 slices shown]
[im 1/15]
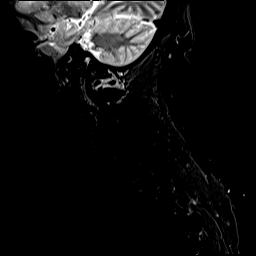
[im 3/15]
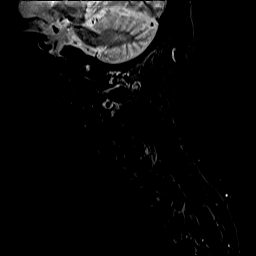
[im 5/15]
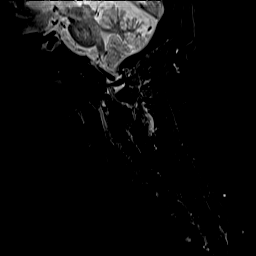
[im 8/15]
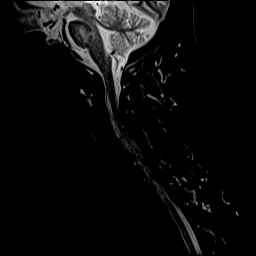
[im 10/15]
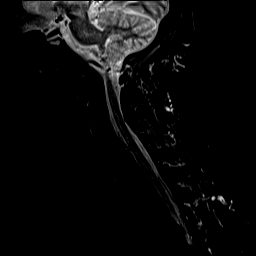
[im 12/15]
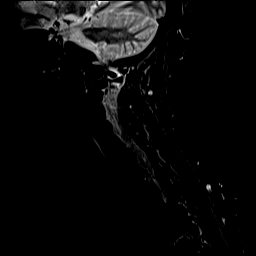
[im 15/15]
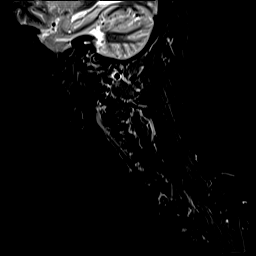

[Series 8: t2_tse_tra_fast · axial · 3.0mm · 0.78mm/px · z∈[-85,+7]mm · 8 of 32 slices shown]
[im 1/32]
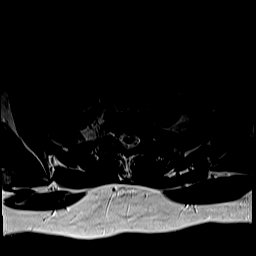
[im 5/32]
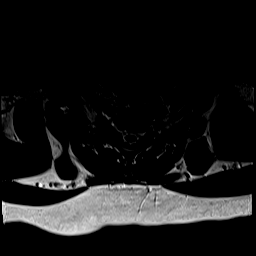
[im 10/32]
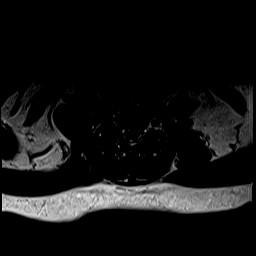
[im 15/32]
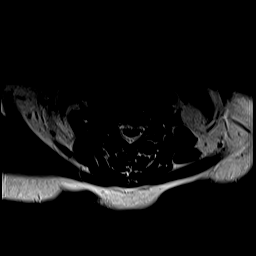
[im 17/32]
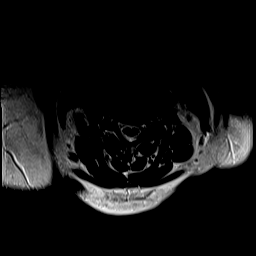
[im 22/32]
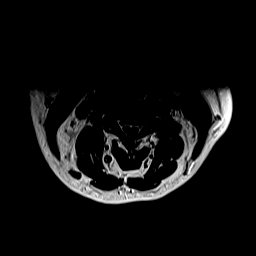
[im 27/32]
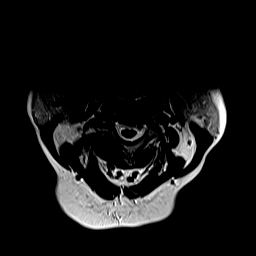
[im 32/32]
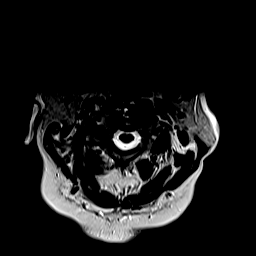

[Series 9: GRE · axial · 3.0mm · 0.78mm/px · z∈[-85,-38]mm · 5 of 32 slices shown]
[im 1/32]
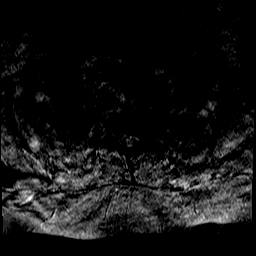
[im 5/32]
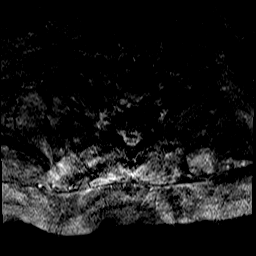
[im 10/32]
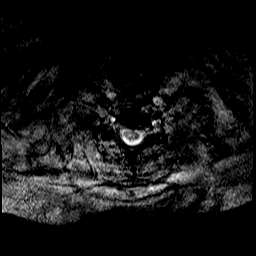
[im 15/32]
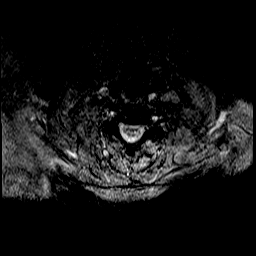
[im 17/32]
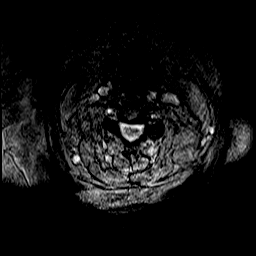

[33 of 48 positions shown; findings below may reference images not displayed]

FINDINGS: Alignment: There is straightening of the normal cervical lordosis.

Vertebrae: The vertebral body heights are well maintained. No
fracture, marrow edema,or pathologic marrow infiltration.

Cord: Normal signal and morphology.

Posterior Fossa, vertebral arteries, paraspinal tissues:

The visualized portion of the posterior fossa is unremarkable.
Normal flow voids seen within the vertebral arteries. The paraspinal
soft tissues are unremarkable.

Disc levels:

C1-C2: Atlanto-axial junction is normal, without canal narrowing

C2-C3: No significant spinal canal or neural foraminal narrowing

C3-C4: There is a disc osteophyte complex and uncovertebral
osteophytes which causes severe bilateral neural foraminal
narrowing.

C4-C5: Uncovertebral osteophytes causes mild left neural foraminal
narrowing.

C5-C6: Disc osteophyte complex and uncovertebral osteophytes causes
moderate bilateral neural foraminal narrowing and mild central canal
stenosis.

C6-C7: Disc osteophyte complex and uncovertebral osteophytes causes
severe right and mild left neural foraminal narrowing.

C7-T1: No significant spinal canal or neural foraminal narrowing
IMPRESSION: Cervical spine spondylosis most notable at C3-C4 with severe
bilateral neural foraminal narrowing and C5-C6 with moderate
bilateral neural foraminal narrowing and mild central canal
stenosis.

## 2023-01-30 ENCOUNTER — Encounter (HOSPITAL_COMMUNITY): Payer: Self-pay | Admitting: Hematology

## 2023-02-04 ENCOUNTER — Ambulatory Visit (HOSPITAL_COMMUNITY)
Admission: RE | Admit: 2023-02-04 | Discharge: 2023-02-04 | Disposition: A | Discharge status: Home / Self Care | Payer: 59 | Source: Ambulatory Visit | Attending: Urology | Admitting: Urology

## 2023-02-04 DIAGNOSIS — N132 Hydronephrosis with renal and ureteral calculous obstruction: Secondary | ICD-10-CM | POA: Diagnosis not present

## 2023-02-04 DIAGNOSIS — N200 Calculus of kidney: Secondary | ICD-10-CM | POA: Diagnosis not present

## 2023-02-04 DIAGNOSIS — N261 Atrophy of kidney (terminal): Secondary | ICD-10-CM | POA: Diagnosis not present

## 2023-02-04 DIAGNOSIS — R93421 Abnormal radiologic findings on diagnostic imaging of right kidney: Secondary | ICD-10-CM | POA: Diagnosis not present

## 2023-02-07 ENCOUNTER — Other Ambulatory Visit: Payer: Self-pay | Admitting: Cardiology

## 2023-02-13 ENCOUNTER — Encounter (HOSPITAL_COMMUNITY): Payer: Self-pay | Admitting: Hematology

## 2023-02-13 ENCOUNTER — Ambulatory Visit (INDEPENDENT_AMBULATORY_CARE_PROVIDER_SITE_OTHER): Payer: 59 | Admitting: Urology

## 2023-02-13 ENCOUNTER — Encounter: Payer: Self-pay | Admitting: Urology

## 2023-02-13 ENCOUNTER — Ambulatory Visit: Payer: 59 | Admitting: Urology

## 2023-02-13 VITALS — BP 109/66 | HR 106

## 2023-02-13 DIAGNOSIS — N200 Calculus of kidney: Secondary | ICD-10-CM | POA: Diagnosis not present

## 2023-02-13 DIAGNOSIS — N1330 Unspecified hydronephrosis: Secondary | ICD-10-CM | POA: Diagnosis not present

## 2023-02-13 DIAGNOSIS — N132 Hydronephrosis with renal and ureteral calculous obstruction: Secondary | ICD-10-CM

## 2023-02-13 NOTE — Progress Notes (Signed)
02/13/2023 11:16 AM   Cheryl Abbott 01-Nov-1945 [id number]  Referring provider: Kerri Perches, MD [address] [place],  [place] [address]  nephrolithiasis   HPI: Cheryl Abbott is a 77yo here for followup for nephrolithiasis and left hydronephrosis. No stone events since last visit. She has chronic intermittent bilateral flank pain. Renal US 8/26 showed improvement in left hydronephrosis and stable bilateral renal calculi   PMH: Past Medical History:  Diagnosis Date   Anxiety disorder    Arthritis    Chronic back pain    Chronic pain of both shoulders    per pt told due to cervical spine pinched nerve   CKD (chronic kidney disease), stage III [facility])    nephrologist--- dr Wolfgang Phoenix;   due to tubular necrosis   Claustrophobia    Depression    Edema of both lower extremities    GERD (gastroesophageal reflux disease)    Headache    Hemorrhoids    w/ intermittant bleeding   History of adenomatous polyp of colon    History of kidney stones    Hx-TIA (transient ischemic attack) 02/26/2016   per pt no residual   Hydronephrosis, left    followed by urologist--- dr Retta Diones;   persistant   Hyperlipidemia    Hypertension    Iron deficiency    followed by [facility] w/ hx iron infusions   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   oncologist--- [facility];  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, active survillence   Lactose intolerance in adult 02/01/2016   Left ureteral calculus    Lumbar disc disease with radiculopathy    Nephrolithiasis    per CT 08-11-2021  left > right   Neuropathy, peripheral    Nocturia more than twice per night    Normocytic anemia    OSA (obstructive sleep apnea)    per study 2007;   per pt no Bipap use since 2021   Renal atrophy, bilateral    Renal cyst, right    Sciatica    Sigmoid diverticulosis    Type 2 diabetes mellitus (HCC)     Surgical History: Past Surgical History:  Procedure Laterality Date    BIOPSY N/A 03/17/2013   Procedure: GASTRIC BIOPSIES;  Surgeon: West Bali, MD;  Location: [facility];  Service: Endoscopy;  Laterality: N/A;   BIOPSY  06/10/2015   Procedure: BIOPSY;  Surgeon: West Bali, MD;  [facility];  Service: Endoscopy;;  gastric biopsy   CARDIAC CATHETERIZATION  05/11/2003   @ [facility] by dr t. Tresa Endo;  Abnormal cardiolite/   Normal coronary arteries and normal LVF,  ef  63%   CARDIOVASCULAR STRESS TEST  01/01/2014   normal lexiscan cardiolite/  no ischemia/ infarct/  normal LV function and wall motion ,  ef 81%   COLONOSCOPY WITH PROPOFOL N/A 02/26/2017   Procedure: COLONOSCOPY WITH PROPOFOL;  Surgeon: West Bali, MD;  [facility];  Service: Endoscopy;  Laterality: N/A;  11:00am   COLONOSCOPY WITH PROPOFOL N/A 09/22/2019   Procedure: COLONOSCOPY WITH PROPOFOL;  Surgeon: West Bali, MD;  [facility];  Service: Endoscopy;  Laterality: N/A;  1:15   CYSTO/   RIGHT URETEROSOCOPY LASER LITHOTRIPSY STONE EXTRACTION  10/13/2004   CYSTO/  RIGHT RETROGRADE PYELOGRAM/  PLACMENT RIGHT URETERAL STENT  01/10/2010   CYSTOSCOPY W/ URETERAL STENT PLACEMENT Right 08/19/2015   Procedure: CYSTOSCOPY WITH RETROGRADE PYELOGRAM/URETERAL STENT  PLACEMENT;  Surgeon: Marcine Matar, MD;  Location: [facility];  Service: Urology;  Laterality: Right;   CYSTOSCOPY W/ URETERAL STENT PLACEMENT Left 02/23/2020   Procedure: CYSTOSCOPY LEFT  RETROGRADE PYELOGRAM LEFT URETEROSCOPY URETERAL STENT PLACEMENT;  Surgeon: Marcine Matar, MD;  [facility]: [facility];  Service: Urology;  Laterality: Left;   CYSTOSCOPY W/ URETERAL STENT PLACEMENT Right 09/24/2022   Procedure: CYSTOSCOPY WITH RETROGRADE PYELOGRAM/URETERAL STENT PLACEMENT;  Surgeon: Malen Gauze, MD;  [facility]: [facility];  Service: Urology;  Laterality: Right;   CYSTOSCOPY W/ URETERAL STENT PLACEMENT Right 10/25/2022   Procedure: RIGHT URETERAL STENT EXCHANGE;  Surgeon:  Malen Gauze, MD;  Location: [facility];  Service: Urology;  Laterality: Right;   CYSTOSCOPY WITH RETROGRADE PYELOGRAM, URETEROSCOPY AND STENT PLACEMENT Left 10/21/2014   Procedure: CYSTOSCOPY WITH RETROGRADE PYELOGRAM, URETEROSCOPY AND STENT PLACEMENT;  Surgeon: Marcine Matar, MD;  Location[facility];  Service: Urology;  Laterality: Left;   CYSTOSCOPY WITH RETROGRADE PYELOGRAM, URETEROSCOPY AND STENT PLACEMENT Right 11/22/2015   Procedure: CYSTOSCOPY, RIGHT URETERAL STENT REMOVAL; RIGHT RETROGRADE PYELOGRAM, RIGHT URETEROSCOPY WITH BALLOON DILATION; RIGHT URETERAL STENT PLACEMENT;  Surgeon: Marcine Matar, MD;  [facility];  Service: Urology;  Laterality: Right;   CYSTOSCOPY WITH RETROGRADE PYELOGRAM, URETEROSCOPY AND STENT PLACEMENT Left 10/18/2020   Procedure: CYSTOSCOPY WITH LEFT RETROGRADE PYELOGRAM, LEFT URETEROSCOPY  WITH LASER AND LEFT URETERAL STENT PLACEMENT;  Surgeon: Marcine Matar, MD;  [facility];  Service: Urology;  Laterality: Left;   CYSTOSCOPY WITH RETROGRADE PYELOGRAM, URETEROSCOPY AND STENT PLACEMENT Left 10/26/2021   Procedure: CYSTOSCOPY WITH RETROGRADE PYELOGRAM, URETEROSCOPY, STONE EXTRACTION  AND STENT PLACEMENT;  Surgeon: Marcine Matar, MD;  Location[facility];  Service: Urology;  Laterality: Left;   CYSTOSCOPY WITH RETROGRADE PYELOGRAM, URETEROSCOPY AND STENT PLACEMENT Bilateral 10/25/2022   Procedure: CYSTOSCOPY WITH RETROGRADE PYELOGRAM, URETEROSCOPY;  Surgeon: Malen Gauze, MD;  [facility];  Service: Urology;  Laterality: Bilateral;   CYSTOSCOPY/RETROGRADE/URETEROSCOPY/STONE EXTRACTION WITH BASKET Bilateral 08/02/2015   Procedure: CYSTOSCOPY; BILATERAL RETROGRADE PYELOGRAMS; BILATERAL URETEROSCOPY, STONE EXTRACTION WITH BASKET;  Surgeon: Marcine Matar, MD;  [facility]: [facility];  Service: Urology;  Laterality: Bilateral;   CYSTOSCOPY/RETROGRADE/URETEROSCOPY/STONE EXTRACTION WITH BASKET Right  06/28/2015   Procedure: CYSTOSCOPY/RETROGRADE/URETEROSCOPY/STONE EXTRACTION WITH BASKET, RIGHT URETERAL DOUBLE J STENT PLACEMENT;  Surgeon: Marcine Matar, MD;  [facility];  Service: Urology;  Laterality: Right;   ESOPHAGOGASTRODUODENOSCOPY (EGD) WITH PROPOFOL N/A 03/17/2013   Procedure: ESOPHAGOGASTRODUODENOSCOPY (EGD) WITH PROPOFOL;  Surgeon: West Bali, MD;  [facility]: [facility];  Service: Endoscopy;  Laterality: N/A;   ESOPHAGOGASTRODUODENOSCOPY (EGD) WITH PROPOFOL N/A 06/10/2015   Distal gastritis, distal esophageal stricture s/p dilation   ESOPHAGOGASTRODUODENOSCOPY (EGD) WITH PROPOFOL N/A 02/26/2017   Procedure: ESOPHAGOGASTRODUODENOSCOPY (EGD) WITH PROPOFOL;  Surgeon: West Bali, MD;  [facility];  Service: Endoscopy;  Laterality: N/A;   FLEXIBLE SIGMOIDOSCOPY  09/11/2011   [id number] Internal hemorrhoids   HOLMIUM LASER APPLICATION Left 10/21/2014   Procedure: HOLMIUM LASER APPLICATION;  Surgeon: Marcine Matar, MD;  Location: [facility];  Service: Urology;  Laterality: Left;   HOLMIUM LASER APPLICATION Right 06/28/2015   Procedure: HOLMIUM LASER APPLICATION;  Surgeon: Marcine Matar, MD;  [facility];  Service: Urology;  Laterality: Right;   HOLMIUM LASER APPLICATION  02/23/2020   Procedure: HOLMIUM LASER APPLICATION;  Surgeon: Marcine Matar, MD;  [facility];  Service: Urology;;   HOLMIUM LASER APPLICATION Left 10/18/2020   Procedure: HOLMIUM LASER APPLICATION;  Surgeon: Marcine Matar, MD;  [facility];  Service: Urology;  Laterality: Left;   HOLMIUM LASER APPLICATION Right 10/25/2022   Procedure: HOLMIUM LASER APPLICATION;  Surgeon: Malen Gauze, MD;  [facility];  Service: Urology;  Laterality: Right;   MASS EXCISION Left 03/04/2015   Procedure: EXCISION OF SOFT TISSUE NEOPLASM LEFT ARM;  Surgeon: Franky Macho, MD;  Location: [facility];  Service: General;  Laterality: Left;   PERCUTANEOUS  NEPHROSTOLITHOTOMY Bilateral 05/2010   @[facility]    POLYPECTOMY N/A 03/17/2013   Procedure: GASTRIC POLYPECTOMY;  Surgeon: West Bali, MD;  [facility];  Service: Endoscopy;  Laterality: N/A;   POLYPECTOMY  02/26/2017   Procedure: POLYPECTOMY;  Surgeon: West Bali, MD;  [facility];  Service: Endoscopy;;  polyp at cecum, ascending colon polyps x3, hepatic flexure polyps x8, transverse colon polyps x8    POLYPECTOMY  09/22/2019   Procedure: POLYPECTOMY;  Surgeon: West Bali, MD;  [facility];  Service: Endoscopy;;   REMOVAL RIGHT THIGH CYST  2006   SAVORY DILATION N/A 03/17/2013   Procedure: SAVORY DILATION;  Surgeon: West Bali, MD;  [facility];  Service: Endoscopy;  Laterality: N/A;  #12.8, 14, 15, 16 dilators used   SAVORY DILATION N/A 06/10/2015   Procedure: SAVORY DILATION;  Surgeon: West Bali, MD;  [facility];  Service: Endoscopy;  Laterality: N/A;   VAGINAL HYSTERECTOMY  1970's    Home Medications:  Allergies as of 02/13/2023       Reactions   Ace Inhibitors Cough   Patient doesn't recall Other Reaction(s): GI Intolerance   Cephalexin Diarrhea, Nausea And Vomiting   Other Reaction(s): GI Intolerance   Nitrofurantoin Diarrhea, Nausea And Vomiting   Penicillins Hives, Other (See Comments)        Medication List        Accurate as of February 13, 2023 11:16 AM. If you have any questions, ask your nurse or doctor.          acetaminophen 500 MG tablet Commonly known as: TYLENOL Take 2 tablets (1,000 mg total) by mouth at bedtime.   amLODipine 10 MG tablet Commonly known as: NORVASC TAKE 1 TABLET BY MOUTH EVERY DAY   aspirin EC 81 MG tablet Take 81 mg by mouth daily. Swallow whole.   cyanocobalamin 500 MCG tablet Commonly known as: VITAMIN B12 Take 1 tablet (500 mcg total) by mouth daily.   diclofenac Sodium 1 % Gel Commonly known as: VOLTAREN APPLY 4 G TOPICALLY 4 (FOUR) TIMES DAILY AS NEEDED  (PAIN).   diphenhydramine-acetaminophen 25-500 MG Tabs tablet Commonly known as: TYLENOL PM Take 2 tablets by mouth at bedtime.   folic acid 1 MG tablet Commonly known as: FOLVITE Take 1 tablet (1 mg total) by mouth daily.   gabapentin 300 MG capsule Commonly known as: NEURONTIN TAKE 1 CAPSULE BY MOUTH THREE TIMES A DAY AS NEEDED   linagliptin 5 MG Tabs tablet Commonly known as: Tradjenta Take 1 tablet (5 mg total) by mouth daily.   Magnesium 400 MG Caps Take 1 tablet by mouth daily.   nitroGLYCERIN 0.4 MG SL tablet Commonly known as: NITROSTAT PLACE 1 TABLET (0.4 MG TOTAL) UNDER THE TONGUE EVERY 5 (FIVE) MINUTES AS NEEDED FOR CHEST PAIN. UP TO 3 TABLETS, THEN CALL 911.   OneTouch Delica Lancets 30G Misc Once daily testing dx e11.9   OneTouch Ultra test strip Generic drug: glucose blood USE AS DIRECTED   pantoprazole 40 MG tablet Commonly known as: PROTONIX TAKE 1 TABLET  BY MOUTH TWICE A DAY   PARoxetine 40 MG tablet Commonly known as: PAXIL TAKE 1 TABLET (40 MG TOTAL) BY MOUTH IN THE MORNING   rosuvastatin 20 MG tablet Commonly known as: CRESTOR Take 20 mg by mouth daily.   Symbicort 80-4.5 MCG/ACT inhaler Generic drug: budesonide-formoterol INHALE 2 PUFFS INTO THE LUNGS TWICE A DAY What changed: See the new instructions.   traMADol 50 MG tablet Commonly known as: ULTRAM TAKE 1 TABLET BY MOUTH EVERY 6 HOURS AS NEEDED   Trulicity 0.75 MG/0.5ML Sopn Generic drug: Dulaglutide INJECT 0.75 MG INTO THE SKIN WEEKLY   UNABLE TO FIND Diabetic shoes x 1 pair, inserts x 3 pair  DX E11.9        Allergies:  Allergies  Allergen Reactions   Ace Inhibitors Cough    Patient doesn't recall  Other Reaction(s): GI Intolerance   Cephalexin Diarrhea and Nausea And Vomiting    Other Reaction(s): GI Intolerance   Nitrofurantoin Diarrhea and Nausea And Vomiting   Penicillins Hives and Other (See Comments)    Family History: Family History  Problem Relation Age  of Onset   Hypertension Mother    Diabetes Mother    Heart failure Mother    Dementia Mother    Emphysema Father    Hypertension Father    Diabetes Brother    GER disease Brother    Hypertension Sister    Hypertension Sister    Cancer Other        family history    Diabetes Other        family history    Heart defect Other        famiily history    Arthritis Other        family history    Anesthesia problems Neg Hx    Hypotension Neg Hx    Malignant hyperthermia Neg Hx    Pseudochol deficiency Neg Hx    Colon cancer Neg Hx     Social History:  reports that she quit smoking about 34 years ago. Her smoking use included cigarettes. She started smoking about 48 years ago. She has a 20 pack-year smoking history. She has been exposed to tobacco smoke. She has never used smokeless tobacco. She reports that she does not drink alcohol and does not use drugs.  ROS: All other review of systems were reviewed and are negative except what is noted above in HPI  Physical Exam: BP 109/66   Pulse (!) 106   Constitutional:  Alert and oriented, No acute distress. HEENT: [place] AT, moist mucus membranes.  Trachea midline, no masses. Cardiovascular: No clubbing, cyanosis, or edema. Respiratory: Normal respiratory effort, no increased work of breathing. GI: Abdomen is soft, nontender, nondistended, no abdominal masses GU: No CVA tenderness.  Lymph: No cervical or inguinal lymphadenopathy. Skin: No rashes, bruises or suspicious lesions. Neurologic: Grossly intact, no focal deficits, moving all 4 extremities. Psychiatric: Normal mood and affect.  Laboratory Data: Lab Results  Component Value Date   WBC 7.6 10/19/2022   HGB 10.3 (L) 10/19/2022   HCT 32.6 (L) 10/19/2022   MCV 93.9 10/19/2022   PLT 247 10/19/2022    Lab Results  Component Value Date   CREATININE 1.29 (H) 11/21/2022    No results found for: "PSA"  No results found for: "TESTOSTERONE"  Lab Results  Component Value  Date   HGBA1C 6.3 (H) 11/21/2022    Urinalysis    Component Value Date/Time   COLORURINE YELLOW 10/17/2022 1658  APPEARANCEUR Clear 11/21/2022 1033   LABSPEC 1.012 10/17/2022 1658   PHURINE 7.0 10/17/2022 1658   GLUCOSEU Negative 11/21/2022 1033   HGBUR LARGE (A) 10/17/2022 1658   HGBUR moderate 02/07/2010 0907   BILIRUBINUR Negative 11/21/2022 1033   KETONESUR NEGATIVE 10/17/2022 1658   PROTEINUR 1+ (A) 11/21/2022 1033   PROTEINUR 100 (A) 10/17/2022 1658   UROBILINOGEN 0.2 10/12/2022 0953   UROBILINOGEN 0.2 03/01/2015 1600   NITRITE Negative 11/21/2022 1033   NITRITE NEGATIVE 10/17/2022 1658   LEUKOCYTESUR Trace (A) 11/21/2022 1033   LEUKOCYTESUR LARGE (A) 10/17/2022 1658    Lab Results  Component Value Date   LABMICR See below: 11/21/2022   WBCUA 6-10 (A) 11/21/2022   LABEPIT 0-10 11/21/2022   MUCUS Present 01/11/2022   BACTERIA None seen 11/21/2022    Pertinent Imaging: Renal US 8/26: Images reviewed and discussed with the patient  Results for orders placed during the hospital encounter of 12/13/20  DG Abd 1 View  Narrative CLINICAL DATA:  Kidney stone status post removal. Question migrated stent.  EXAM: ABDOMEN - 1 VIEW  COMPARISON:  X-ray abdomen 09/28/2020,  FINDINGS: Left ureteral stent with proximal pigtail overlying the expected region of the renal shadow and distal pigtail overlying the expected region of the urinary bladder. The bowel gas pattern is normal. No radio-opaque calculi or other significant radiographic abnormality are seen.  IMPRESSION: Left ureteral stent with proximal pigtail overlying the expected region of the left renal shadow and distal pigtail overlying the expected region of the urinary bladder. Markedly limited evaluation of exact positioning due to bowel gas overlying the abdomen and pelvis as well as no post stent placement comparison images. If continued concern, recommend CT pelvis for further  evaluation.   Electronically Signed By: Tish Frederickson M.D. On: 12/13/2020 21:09  No results found for this or any previous visit.  No results found for this or any previous visit.  No results found for this or any previous visit.  Results for orders placed during the hospital encounter of 02/04/23  Ultrasound renal complete  Narrative CLINICAL DATA:  Nephrolithiasis.  EXAM: RENAL / URINARY TRACT ULTRASOUND COMPLETE  COMPARISON:  CT 10/17/2022.  Older ultrasound  FINDINGS: Right Kidney:  Renal measurements: 11.0 x 4.9 x 6.5 cm = volume: 156.1 mL. Global mild atrophy of the right kidney. No collecting system dilatation. Portions are obscured by overlapping bowel gas. 15 mm upper pole hypoechoic focus identified. Not clearly a simple cyst. Small echogenic foci identified in the right kidney as well. Possible small stones.  Left Kidney:  Renal measurements: 12.1 x 5.2 x 4.9 cm = volume: 163.5 mL. Few echogenic areas identified consistent with small stones as seen on prior CT. There is also some small hypoechoic areas identified which are felt to be likely cysts but have internal echoes and not clearly simple by strict criteria. The collecting system dilatation has improved.  Bladder:  Appears normal for degree of bladder distention.  Other:  None.  IMPRESSION: Bilateral renal atrophy.  Improved left-sided renal collecting system dilatation.  Bilateral calcifications are seen, left-greater-than-right.  There is also some small cystic foci in each kidney which are obscured by the overlapping bowel gas and soft tissue not clearly simple on this examination. Please correlate with previous exams or follow-up   Electronically Signed By: Karen Kays M.D. On: 02/10/2023 11:51  No valid procedures specified. No results found for this or any previous visit.  Results for orders placed during the hospital encounter of  10/17/22  CT RENAL STONE  STUDY  Narrative CLINICAL DATA:  Left lower quadrant abdominal/flank pain. Recent urinary tract infection.  EXAM: CT ABDOMEN AND PELVIS WITHOUT CONTRAST  TECHNIQUE: Multidetector CT imaging of the abdomen and pelvis was performed following the standard protocol without IV contrast.  RADIATION DOSE REDUCTION: This exam was performed according to the departmental dose-optimization program which includes automated exposure control, adjustment of the mA and/or kV according to patient size and/or use of iterative reconstruction technique.  COMPARISON:  CT 09/23/2022, 02/13/2022  FINDINGS: Lower chest: Bandlike subsegmental atelectasis in the right middle and lower lobe related to elevated hemidiaphragm. 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 No acute airspace disease.  Hepatobiliary: Unremarkable unenhanced appearance of the liver. Gallbladder physiologically distended, no calcified stone. No biliary dilatation.  Pancreas: No ductal dilatation or inflammation.  Spleen: Normal in size without focal abnormality.  Adrenals/Urinary Tract: No adrenal nodule. Placement of right nephroureteral stent since prior exam. Proximal pigtails coiled in the dilated renal pelvis, the distal pigtail is coiled in the bladder. 3 mm stone fragment adjacent to the distal stent series 2, image 72. There is diminished right hydronephrosis from prior exam with persistent dilatation of the renal pelvis and ureter. Mild background thinning of right renal parenchyma. There is no evidence of renal fluid collection. Hypodense right renal lesion on prior exam is not well seen in the absence of IV contrast.  Progressive left hydroureteronephrosis from prior. There is a punctate stone in the distal left ureter, series 5, image 77 causing proximal obstruction. Multiple additional punctate nonobstructing left intrarenal calculi. There is mild thinning of the left  renal parenchyma.  Stomach/Bowel: Unremarkable appearance of the stomach. Small duodenal diverticulum. There is no bowel obstruction or inflammation. Normal appendix. Left colonic diverticulosis, most prominent in the sigmoid. No diverticulitis.  Vascular/Lymphatic: Mild aortic atherosclerosis. No aneurysm. No enlarged lymph nodes in the abdomen or pelvis.  Reproductive: Status post hysterectomy. No adnexal masses.  Other: No free air or free fluid. Scattered subcutaneous densities in the lower anterior abdominal wall are typical of medication injection sites. Tiny fat containing umbilical hernia. There is minimal fat in both inguinal canals.  Musculoskeletal: Lumbar facet hypertrophy. There are no acute or suspicious osseous abnormalities. Left gluteal subcutaneous granulomas.  IMPRESSION: 1. Punctate distal left ureteric stone causing moderate proximal hydroureteronephrosis. Increased hydronephrosis from prior exam. Multiple additional punctate nonobstructing left intrarenal calculi. 2. Placement of right nephroureteral stent since prior exam. Diminished right hydronephrosis with persistent renal pelvis dilatation. 3 mm stone is seen adjacent to the distal portion of the stent. 3. Colonic diverticulosis without diverticulitis.  Aortic Atherosclerosis (ICD10-I70.0).   Electronically Signed By: Narda Rutherford M.D. On: 10/17/2022 15:06   Assessment & Plan:    1. Calculus of kidney -Dietary handout given -followup 6 months with renal US - Urinalysis, Routine w reflex microscopic  2. Hydronephrosis with urinary obstruction due to ureteral calculus -improved -followup 6 months with renal US   No follow-ups on file.  Wilkie Aye, MD  [facility] [place]

## 2023-02-13 NOTE — Patient Instructions (Signed)
Dietary Guidelines to Help Prevent Kidney Stones Kidney stones are deposits of minerals and salts that form inside your kidneys. Your risk of developing kidney stones may be greater depending on your diet, your lifestyle, the medicines you take, and whether you have certain medical conditions. Most people can lower their risks of developing kidney stones by following these dietary guidelines. Your dietitian may give you more specific instructions depending on your overall health and the type of kidney stones you tend to develop. What are tips for following this plan? Reading food labels  Choose foods with "no salt added" or "low-salt" labels. Limit your salt (sodium) intake to less than 1,500 mg a day. Choose foods with calcium for each meal and snack. Try to eat about 300 mg of calcium at each meal. Foods that contain 200-500 mg of calcium a serving include: 8 oz (237 mL) of milk, calcium-fortifiednon-dairy milk, and calcium-fortifiedfruit juice. Calcium-fortified means that calcium has been added to these drinks. 8 oz (237 mL) of kefir, yogurt, and soy yogurt. 4 oz (114 g) of tofu. 1 oz (28 g) of cheese. 1 cup (150 g) of dried figs. 1 cup (91 g) of cooked broccoli. One 3 oz (85 g) can of sardines or mackerel. Most people need 1,000-1,500 mg of calcium a day. Talk to your dietitian about how much calcium is recommended for you. Shopping Buy plenty of fresh fruits and vegetables. Most people do not need to avoid fruits and vegetables, even if these foods contain nutrients that may contribute to kidney stones. When shopping for convenience foods, choose: Whole pieces of fruit. Pre-made salads with dressing on the side. Low-fat fruit and yogurt smoothies. Avoid buying frozen meals or prepared deli foods. These can be high in sodium. Look for foods with live cultures, such as yogurt and kefir. Choose high-fiber grains, such as whole-wheat breads, oat bran, and wheat cereals. Cooking Do not add  salt to food when cooking. Place a salt shaker on the table and allow each person to add their own salt to taste. Use vegetable protein, such as beans, textured vegetable protein (TVP), or tofu, instead of meat in pasta, casseroles, and soups. Meal planning Eat less salt, if told by your dietitian. To do this: Avoid eating processed or pre-made food. Avoid eating fast food. Eat less animal protein, including cheese, meat, poultry, or fish, if told by your dietitian. To do this: Limit the number of times you have meat, poultry, fish, or cheese each week. Eat a diet free of meat at least 2 days a week. Eat only one serving each day of meat, poultry, fish, or seafood. When you prepare animal proteins, cut pieces into small portion sizes. For most meat and fish, one serving is about the size of the palm of your hand. Eat at least five servings of fresh fruits and vegetables each day. To do this: Keep fruits and vegetables on hand for snacks. Eat one piece of fruit or a handful of berries with breakfast. Have a salad and fruit at lunch. Have two kinds of vegetables at dinner. You may be told to limit foods that are high in a substance called oxalate. These include: Spinach (cooked), rhubarb, beets, sweet potatoes, and Swiss chard. Peanuts. Potato chips, french fries, and baked potatoes with skin on. Nuts and nut products. Chocolate. If you regularly take a diuretic medicine, make sure to eat at least 1 or 2 servings of fruits or vegetables that are high in potassium each day. These include: Avocado.   Banana. Orange, prune, carrot, or tomato juice. Baked potato. Cabbage. Beans and split peas. Lifestyle  Drink enough fluid to keep your urine pale yellow. This is the most important thing you can do. Spread your fluid intake throughout the day. If you drink alcohol: Limit how much you have to: 0-1 drink a day for women who are not pregnant. 0-2 drinks a day for men. Know how much alcohol is  in your drink. In the [place] one drink equals one 12 oz bottle of beer (355 mL), one 5 oz glass of wine (148 mL), or one 1 oz glass of hard liquor (44 mL). Lose weight if told by your health care provider. Work with your dietitian to find an eating plan and weight loss strategies that work best for you. General information Talk to your health care provider and dietitian about taking daily supplements. Depending on your health and the cause of your kidney stones, you may be told: Do not take high-dose supplements of vitamin C (1,000 mg a day or more). To take a calcium supplement. To take a daily probiotic supplement. To take other supplements such as magnesium, fish oil, or vitamin B6. Take over-the-counter and prescription medicines only as told by your health care provider. These include supplements. What foods should I limit? Limit your intake of the following foods, or eat them as told by your dietitian. Vegetables Spinach. Rhubarb. Beets. Canned vegetables. Pickles. Olives. Baked potatoes with skin. Grains Wheat bran. Baked goods. Salted crackers. Cereals high in sugar. Meats and other proteins Nuts. Nut butters. Large portions of meat, poultry, or fish. Salted, precooked, or cured meats, such as sausages, meat loaves, and hot dogs. Dairy Cheeses. Beverages Regular soft drinks. Regular vegetable juice. Seasonings and condiments Seasoning blends with salt. Salad dressings. Soy sauce. Ketchup. Barbecue sauce. Other foods Canned soups. Canned pasta sauce. Casseroles. Pizza. Lasagna. Frozen meals. Potato chips. French fries. The items listed above may not be a complete list of foods and beverages you should limit. Contact a dietitian for more information. What foods should I avoid? Talk to your dietitian about specific foods you should avoid based on the type of kidney stones you have and your overall health. Fruits Grapefruit. The item listed above may not be a complete list of foods  and beverages you should avoid. Contact a dietitian for more information. Summary Kidney stones are deposits of minerals and salts that form inside your kidneys. You can lower your risk of kidney stones by making changes to your diet. The most important thing you can do is drink enough fluid. Drink enough fluid to keep your urine pale yellow. Talk to your dietitian about how much calcium you should have each day, and eat less salt and animal protein as told by your dietitian. This information is not intended to replace advice given to you by your health care provider. Make sure you discuss any questions you have with your health care provider. Document Revised: 09/07/2021 Document Reviewed: 09/07/2021 Elsevier Patient Education  2024 [employer]  

## 2023-02-13 NOTE — Addendum Note (Signed)
Addended by: Wilkie Aye L on: 02/13/2023 11:20 AM   Modules accepted: Orders

## 2023-02-18 ENCOUNTER — Other Ambulatory Visit: Payer: Self-pay | Admitting: Orthopedic Surgery

## 2023-02-18 DIAGNOSIS — R0902 Hypoxemia: Secondary | ICD-10-CM | POA: Diagnosis not present

## 2023-02-18 DIAGNOSIS — M25512 Pain in left shoulder: Secondary | ICD-10-CM

## 2023-02-19 ENCOUNTER — Other Ambulatory Visit: Payer: Self-pay | Admitting: Orthopedic Surgery

## 2023-02-19 DIAGNOSIS — G8929 Other chronic pain: Secondary | ICD-10-CM

## 2023-02-25 ENCOUNTER — Encounter: Payer: Self-pay | Admitting: Cardiology

## 2023-02-25 ENCOUNTER — Ambulatory Visit: Payer: 59 | Attending: Cardiology | Admitting: Cardiology

## 2023-02-25 VITALS — BP 130/72 | HR 93 | Ht 66.5 in | Wt 185.2 lb

## 2023-02-25 DIAGNOSIS — R0789 Other chest pain: Secondary | ICD-10-CM

## 2023-02-25 DIAGNOSIS — I10 Essential (primary) hypertension: Secondary | ICD-10-CM

## 2023-02-25 DIAGNOSIS — E782 Mixed hyperlipidemia: Secondary | ICD-10-CM | POA: Diagnosis not present

## 2023-02-25 NOTE — Progress Notes (Signed)
Clinical Summary Ms. Mehrtens is a 77 y.o.female seen today for follow up of the following medical problems.    1. Chest pain/SOB - echo 02/2016 LVEF 60-65%, grade I diastoilc dysfunction - 12/2013 nuclear stress without ischemia, low risk.  07/2016 PFTs: mild restrictive disease   03/2018 LVEF 55-60%, no WMAs, indeterminate diastolic function - still some SOB at times, overall stable. No recent chest pain      01/2022 nuclear stress: no ischemia Some chest pains at times, often with anxiety  - chronic chest pains ongoing unchanged. Occurs 1-2 times per week. Often comes on with anxiety. Midhchest, pressure like feeling. 5/10 in severity, no other associated symptoms. Lasts few minutes. Better with NG.  - reports some nausea on imdur which we started last visit,she stopped taking. - she is not interested in considering management of her anxiety.        2. History of TIA       3. History of multiple myeloma       4. HTN - compliant with meds, controlled at other recent visits.     5. OSA Not comfortable wearing cpap      6. HL - 05/2018 TC 172 HDL 47 TG 120 LDL 103 - 11/2018 TC 162 TG 121 HDL 47 LDL 91 - compliant with statin Jan 2023 TC 129 TG 152 HDL 41 LDL 58 03/2022 TC 150 TG 111 HDL 41 LDL 89 11/2022 TC 125 TG 95 HDL 47 LDL 60   7.History of recurrent kidney stones Past Medical History:  Diagnosis Date   Anxiety disorder    Arthritis    Chronic back pain    Chronic pain of both shoulders    per pt told due to cervical spine pinched nerve   CKD (chronic kidney disease), stage III [facility])    nephrologist--- dr Wolfgang Phoenix;   due to tubular necrosis   Claustrophobia    Depression    Edema of both lower extremities    GERD (gastroesophageal reflux disease)    Headache    Hemorrhoids    w/ intermittant bleeding   History of adenomatous polyp of colon    History of kidney stones    Hx-TIA (transient ischemic attack) 02/26/2016   per pt no residual    Hydronephrosis, left    followed by urologist--- dr Retta Diones;   persistant   Hyperlipidemia    Hypertension    Iron deficiency    followed by [facility] w/ hx iron infusions   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   oncologist--- [facility];  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, active survillence   Lactose intolerance in adult 02/01/2016   Left ureteral calculus    Lumbar disc disease with radiculopathy    Nephrolithiasis    per CT 08-11-2021  left > right   Neuropathy, peripheral    Nocturia more than twice per night    Normocytic anemia    OSA (obstructive sleep apnea)    per study 2007;   per pt no Bipap use since 2021   Renal atrophy, bilateral    Renal cyst, right    Sciatica    Sigmoid diverticulosis    Type 2 diabetes mellitus (HCC)      Allergies  Allergen Reactions   Ace Inhibitors Cough    Patient doesn't recall  Other Reaction(s): GI Intolerance   Cephalexin Diarrhea and Nausea And Vomiting    Other Reaction(s): GI Intolerance  Nitrofurantoin Diarrhea and Nausea And Vomiting   Penicillins Hives and Other (See Comments)     Current Outpatient Medications  Medication Sig Dispense Refill   acetaminophen (TYLENOL) 500 MG tablet Take 2 tablets (1,000 mg total) by mouth at bedtime.     amLODipine (NORVASC) 10 MG tablet TAKE 1 TABLET BY MOUTH EVERY DAY (Patient taking differently: Take 10 mg by mouth daily.) 90 tablet 1   aspirin EC 81 MG tablet Take 81 mg by mouth daily. Swallow whole.     diclofenac Sodium (VOLTAREN) 1 % GEL APPLY 4 G TOPICALLY 4 (FOUR) TIMES DAILY AS NEEDED (PAIN). 400 g 5   diphenhydramine-acetaminophen (TYLENOL PM) 25-500 MG TABS tablet Take 2 tablets by mouth at bedtime.     Dulaglutide (TRULICITY) 0.75 MG/0.5ML SOPN INJECT 0.75 MG INTO THE SKIN WEEKLY 6 mL 1   folic acid (FOLVITE) 1 MG tablet Take 1 tablet (1 mg total) by mouth daily.     gabapentin (NEURONTIN) 300 MG capsule TAKE 1 CAPSULE BY MOUTH THREE TIMES A DAY  AS NEEDED 90 capsule 0   glucose blood (ONETOUCH ULTRA) test strip USE AS DIRECTED 100 strip 5   linagliptin (TRADJENTA) 5 MG TABS tablet Take 1 tablet (5 mg total) by mouth daily. 90 tablet 1   Magnesium 400 MG CAPS Take 1 tablet by mouth daily. 30 capsule 11   nitroGLYCERIN (NITROSTAT) 0.4 MG SL tablet PLACE 1 TABLET (0.4 MG TOTAL) UNDER THE TONGUE EVERY 5 (FIVE) MINUTES AS NEEDED FOR CHEST PAIN. UP TO 3 TABLETS, THEN CALL 911. 25 tablet 3   OneTouch Delica Lancets 30G MISC Once daily testing dx e11.9 100 each 5   pantoprazole (PROTONIX) 40 MG tablet TAKE 1 TABLET BY MOUTH TWICE A DAY 180 tablet 1   PARoxetine (PAXIL) 40 MG tablet TAKE 1 TABLET (40 MG TOTAL) BY MOUTH IN THE MORNING 90 tablet 1   rosuvastatin (CRESTOR) 20 MG tablet Take 20 mg by mouth daily.     SYMBICORT 80-4.5 MCG/ACT inhaler INHALE 2 PUFFS INTO THE LUNGS TWICE A DAY (Patient taking differently: Inhale 2 puffs into the lungs 2 (two) times daily.) 10.2 each 12   traMADol (ULTRAM) 50 MG tablet TAKE 1 TABLET BY MOUTH EVERY 6 HOURS AS NEEDED 60 tablet 0   UNABLE TO FIND Diabetic shoes x 1 pair, inserts x 3 pair  DX E11.9 1 each 0   vitamin B-12 (VITAMIN B12) 500 MCG tablet Take 1 tablet (500 mcg total) by mouth daily.     No current facility-administered medications for this visit.     Past Surgical History:  Procedure Laterality Date   BIOPSY N/A 03/17/2013   Procedure: GASTRIC BIOPSIES;  Surgeon: West Bali, MD;  [facility]: [facility];  Service: Endoscopy;  Laterality: N/A;   BIOPSY  06/10/2015   Procedure: BIOPSY;  Surgeon: West Bali, MD;  [facility];  Service: Endoscopy;;  gastric biopsy   CARDIAC CATHETERIZATION  05/11/2003   @ [facility] by dr t. Tresa Endo;  Abnormal cardiolite/   Normal coronary arteries and normal LVF,  ef  63%   CARDIOVASCULAR STRESS TEST  01/01/2014   normal lexiscan cardiolite/  no ischemia/ infarct/  normal LV function and wall motion ,  ef 81%    COLONOSCOPY WITH PROPOFOL N/A 02/26/2017   Procedure: COLONOSCOPY WITH PROPOFOL;  Surgeon: West Bali, MD;  [facility];  Service: Endoscopy;  Laterality: N/A;  11:00am   COLONOSCOPY WITH PROPOFOL  N/A 09/22/2019   Procedure: COLONOSCOPY WITH PROPOFOL;  Surgeon: West Bali, MD;  [facility];  Service: Endoscopy;  Laterality: N/A;  1:15   CYSTO/   RIGHT URETEROSOCOPY LASER LITHOTRIPSY STONE EXTRACTION  10/13/2004   CYSTO/  RIGHT RETROGRADE PYELOGRAM/  PLACMENT RIGHT URETERAL STENT  01/10/2010   CYSTOSCOPY W/ URETERAL STENT PLACEMENT Right 08/19/2015   Procedure: CYSTOSCOPY WITH RETROGRADE PYELOGRAM/URETERAL STENT PLACEMENT;  Surgeon: Marcine Matar, MD;  [facility];  Service: Urology;  Laterality: Right;   CYSTOSCOPY W/ URETERAL STENT PLACEMENT Left 02/23/2020   Procedure: CYSTOSCOPY LEFT  RETROGRADE PYELOGRAM LEFT URETEROSCOPY URETERAL STENT PLACEMENT;  Surgeon: Marcine Matar, MD;  Location: [facility];  Service: Urology;  Laterality: Left;   CYSTOSCOPY W/ URETERAL STENT PLACEMENT Right 09/24/2022   Procedure: CYSTOSCOPY WITH RETROGRADE PYELOGRAM/URETERAL STENT PLACEMENT;  Surgeon: Malen Gauze, MD;  Location: [facility];  Service: Urology;  Laterality: Right;   CYSTOSCOPY W/ URETERAL STENT PLACEMENT Right 10/25/2022   Procedure: RIGHT URETERAL STENT EXCHANGE;  Surgeon: Malen Gauze, MD;  Location: [facility];  Service: Urology;  Laterality: Right;   CYSTOSCOPY WITH RETROGRADE PYELOGRAM, URETEROSCOPY AND STENT PLACEMENT Left 10/21/2014   Procedure: CYSTOSCOPY WITH RETROGRADE PYELOGRAM, URETEROSCOPY AND STENT PLACEMENT;  Surgeon: Marcine Matar, MD;  Location: [facility];  Service: Urology;  Laterality: Left;   CYSTOSCOPY WITH RETROGRADE PYELOGRAM, URETEROSCOPY AND STENT PLACEMENT Right 11/22/2015   Procedure: CYSTOSCOPY, RIGHT URETERAL STENT REMOVAL; RIGHT RETROGRADE PYELOGRAM, RIGHT URETEROSCOPY WITH BALLOON DILATION; RIGHT URETERAL  STENT PLACEMENT;  Surgeon: Marcine Matar, MD;  [facility];  Service: Urology;  Laterality: Right;   CYSTOSCOPY WITH RETROGRADE PYELOGRAM, URETEROSCOPY AND STENT PLACEMENT Left 10/18/2020   Procedure: CYSTOSCOPY WITH LEFT RETROGRADE PYELOGRAM, LEFT URETEROSCOPY  WITH LASER AND LEFT URETERAL STENT PLACEMENT;  Surgeon: Marcine Matar, MD;  [facility];  Service: Urology;  Laterality: Left;   CYSTOSCOPY WITH RETROGRADE PYELOGRAM, URETEROSCOPY AND STENT PLACEMENT Left 10/26/2021   Procedure: CYSTOSCOPY WITH RETROGRADE PYELOGRAM, URETEROSCOPY, STONE EXTRACTION  AND STENT PLACEMENT;  Surgeon: Marcine Matar, MD;  Location: [facility];  Service: Urology;  Laterality: Left;   CYSTOSCOPY WITH RETROGRADE PYELOGRAM, URETEROSCOPY AND STENT PLACEMENT Bilateral 10/25/2022   Procedure: CYSTOSCOPY WITH RETROGRADE PYELOGRAM, URETEROSCOPY;  Surgeon: Malen Gauze, MD;  [facility];  Service: Urology;  Laterality: Bilateral;   CYSTOSCOPY/RETROGRADE/URETEROSCOPY/STONE EXTRACTION WITH BASKET Bilateral 08/02/2015   Procedure: CYSTOSCOPY; BILATERAL RETROGRADE PYELOGRAMS; BILATERAL URETEROSCOPY, STONE EXTRACTION WITH BASKET;  Surgeon: Marcine Matar, MD;  [facility];  Service: Urology;  Laterality: Bilateral;   CYSTOSCOPY/RETROGRADE/URETEROSCOPY/STONE EXTRACTION WITH BASKET Right 06/28/2015   Procedure: CYSTOSCOPY/RETROGRADE/URETEROSCOPY/STONE EXTRACTION WITH BASKET, RIGHT URETERAL DOUBLE J STENT PLACEMENT;  Surgeon: Marcine Matar, MD;  [facility];  Service: Urology;  Laterality: Right;   ESOPHAGOGASTRODUODENOSCOPY (EGD) WITH PROPOFOL N/A 03/17/2013   Procedure: ESOPHAGOGASTRODUODENOSCOPY (EGD) WITH PROPOFOL;  Surgeon: West Bali, MD;  [facility];  Service: Endoscopy;  Laterality: N/A;   ESOPHAGOGASTRODUODENOSCOPY (EGD) WITH PROPOFOL N/A 06/10/2015   Distal gastritis, distal esophageal stricture s/p dilation   ESOPHAGOGASTRODUODENOSCOPY (EGD) WITH  PROPOFOL N/A 02/26/2017   Procedure: ESOPHAGOGASTRODUODENOSCOPY (EGD) WITH PROPOFOL;  Surgeon: West Bali, MD;  [facility];  Service: Endoscopy;  Laterality: N/A;   FLEXIBLE SIGMOIDOSCOPY  09/11/2011   UXL:[id number] Internal hemorrhoids   HOLMIUM LASER APPLICATION Left 10/21/2014   Procedure: HOLMIUM LASER APPLICATION;  Surgeon: Marcine Matar, MD;  Location: [facility];  Service: Urology;  Laterality: Left;   HOLMIUM LASER  APPLICATION Right 06/28/2015   Procedure: HOLMIUM LASER APPLICATION;  Surgeon: Marcine Matar, MD;  [facility];  Service: Urology;  Laterality: Right;   HOLMIUM LASER APPLICATION  02/23/2020   Procedure: HOLMIUM LASER APPLICATION;  Surgeon: Marcine Matar, MD;  [facility];  Service: Urology;;   HOLMIUM LASER APPLICATION Left 10/18/2020   Procedure: HOLMIUM LASER APPLICATION;  Surgeon: Marcine Matar, MD;  [facility];  Service: Urology;  Laterality: Left;   HOLMIUM LASER APPLICATION Right 10/25/2022   Procedure: HOLMIUM LASER APPLICATION;  Surgeon: Malen Gauze, MD;  [facility];  Service: Urology;  Laterality: Right;   MASS EXCISION Left 03/04/2015   Procedure: EXCISION OF SOFT TISSUE NEOPLASM LEFT ARM;  Surgeon: Franky Macho, MD;  Location: [facility];  Service: General;  Laterality: Left;   PERCUTANEOUS NEPHROSTOLITHOTOMY Bilateral 05/2010   @[facility]    POLYPECTOMY N/A 03/17/2013   Procedure: GASTRIC POLYPECTOMY;  Surgeon: West Bali, MD;  [facility];  Service: Endoscopy;  Laterality: N/A;   POLYPECTOMY  02/26/2017   Procedure: POLYPECTOMY;  Surgeon: West Bali, MD;  [facility];  Service: Endoscopy;;  polyp at cecum, ascending colon polyps x3, hepatic flexure polyps x8, transverse colon polyps x8    POLYPECTOMY  09/22/2019   Procedure: POLYPECTOMY;  Surgeon: West Bali, MD;  [facility];  Service: Endoscopy;;   REMOVAL RIGHT THIGH CYST  2006   SAVORY  DILATION N/A 03/17/2013   Procedure: SAVORY DILATION;  Surgeon: West Bali, MD;  Location[facility];  Service: Endoscopy;  Laterality: N/A;  #12.8, 14, 15, 16 dilators used   SAVORY DILATION N/A 06/10/2015   Procedure: SAVORY DILATION;  Surgeon: West Bali, MD;  [facility];  Service: Endoscopy;  Laterality: N/A;   VAGINAL HYSTERECTOMY  1970's     Allergies  Allergen Reactions   Ace Inhibitors Cough    Patient doesn't recall  Other Reaction(s): GI Intolerance   Cephalexin Diarrhea and Nausea And Vomiting    Other Reaction(s): GI Intolerance   Nitrofurantoin Diarrhea and Nausea And Vomiting   Penicillins Hives and Other (See Comments)      Family History  Problem Relation Age of Onset   Hypertension Mother    Diabetes Mother    Heart failure Mother    Dementia Mother    Emphysema Father    Hypertension Father    Diabetes Brother    GER disease Brother    Hypertension Sister    Hypertension Sister    Cancer Other        family history    Diabetes Other        family history    Heart defect Other        famiily history    Arthritis Other        family history    Anesthesia problems Neg Hx    Hypotension Neg Hx    Malignant hyperthermia Neg Hx    Pseudochol deficiency Neg Hx    Colon cancer Neg Hx      Social History Ms. Schank reports that she quit smoking about 34 years ago. Her smoking use included cigarettes. She started smoking about 48 years ago. She has a 20 pack-year smoking history. She has been exposed to tobacco smoke. She has never used smokeless tobacco. Ms. Deschaine reports no history of alcohol use.   Review of Systems CONSTITUTIONAL: No weight loss, fever, chills, weakness or fatigue.  HEENT: Eyes: No visual loss,  blurred vision, double vision or yellow sclerae.No hearing loss, sneezing, congestion, runny nose or sore throat.  SKIN: No rash or itching.  CARDIOVASCULAR: per hpi RESPIRATORY: No shortness of breath, cough or  sputum.  GASTROINTESTINAL: No anorexia, nausea, vomiting or diarrhea. No abdominal pain or blood.  GENITOURINARY: No burning on urination, no polyuria NEUROLOGICAL: No headache, dizziness, syncope, paralysis, ataxia, numbness or tingling in the extremities. No change in bowel or bladder control.  MUSCULOSKELETAL: No muscle, back pain, joint pain or stiffness.  LYMPHATICS: No enlarged nodes. No history of splenectomy.  PSYCHIATRIC: No history of depression or anxiety.  ENDOCRINOLOGIC: No reports of sweating, cold or heat intolerance. No polyuria or polydipsia.  Marland Kitchen   Physical Examination Today's Vitals   02/25/23 1129  BP: 130/72  Pulse: 93  SpO2: 95%  Weight: 185 lb 3.2 oz (84 kg)  Height: 5' 6.5" (1.689 m)   Body mass index is 29.44 kg/m.  Gen: resting comfortably, no acute distress HEENT: no scleral icterus, pupils equal round and reactive, no palptable cervical adenopathy,  CV: RRR, no mrg, no jvd Resp: Clear to auscultation bilaterally GI: abdomen is soft, non-tender, non-distended, normal bowel sounds, no hepatosplenomegaly MSK: extremities are warm, no edema.  Skin: warm, no rash Neuro:  no focal deficits Psych: appropriate affect   Diagnostic Studies 03/2018 echo Study Conclusions   - Left ventricle: The cavity size was normal. Wall thickness was   increased in a pattern of mild LVH. Systolic function was normal.   The estimated ejection fraction was in the range of 55% to 60%.   Wall motion was normal; there were no regional wall motion   abnormalities. Indeterminate diastolic function. - Right ventricle: The cavity size was mildly dilated. - Right atrium: Central venous pressure (est): 3 mm Hg. - Atrial septum: No defect or patent foramen ovale was identified. - Tricuspid valve: There was trivial regurgitation. - Pulmonary arteries: PA peak pressure: 21 mm Hg (S). - Pericardium, extracardiac: There was no pericardial effusion.   01/2022 nuclear stress The  study is normal. There are no perfusion defects consistent with prior infarct or current ischemia.  The study is low risk.   No ST deviation was noted.   Left ventricular function is normal. Nuclear stress EF: 64 %. The left ventricular ejection fraction is normal (55-65%). End diastolic cavity size is normal.    Assessment and Plan   1. Chest pain -long history of chest pains at least the last 10 years.  - multiple stress tests have been benign including last year - symptoms typically come on with anxiety, but can be better with SL NG - tried imdur but reported nausea and stopped. SHe is on norvasc - discussed addressing the anxiety with her pcp, she prefers not to at this time - no further cardiac testing at this time.     2. HTN - she is at goal, continue current meds   3. Hyperlipidemia - at goal, continue current meds   F/u 6 months    Antoine Poche, M.D.

## 2023-02-25 NOTE — Patient Instructions (Signed)
Medication Instructions:  Continue all current medications.  Labwork: none  Testing/Procedures: none  Follow-Up: 6 months   Any Other Special Instructions Will Be Listed Below (If Applicable).  If you need a refill on your cardiac medications before your next appointment, please call your pharmacy.  

## 2023-03-02 IMAGING — US US RENAL
1 series · 13 of 25 positions shown · non-contrast
Comparison: 04/22/2020

CLINICAL DATA: Acute kidney failure with tubular necrosis, history
hypertension, type II diabetes mellitus

EXAM:
RENAL / URINARY TRACT ULTRASOUND COMPLETE

[Series 1: us renal · 13 of 86 slices shown]
[im 1/86]
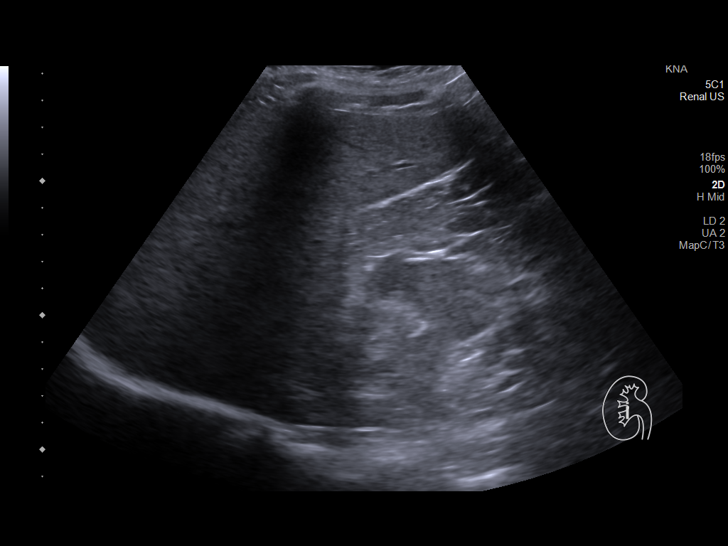
[im 8/86]
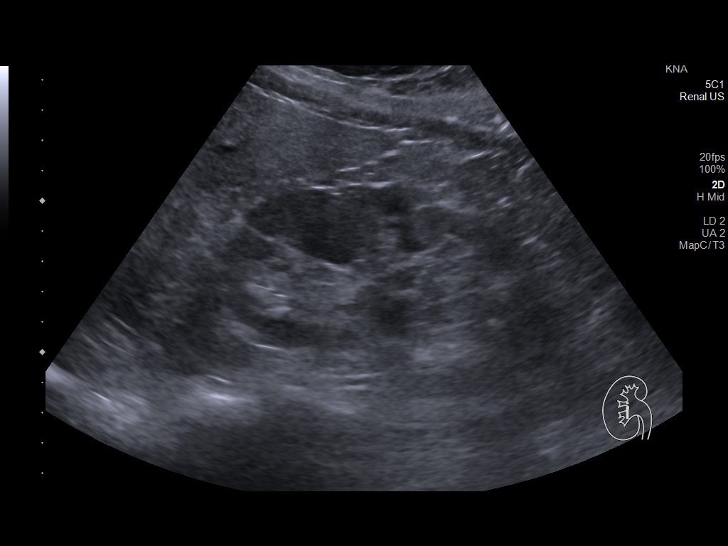
[im 15/86]
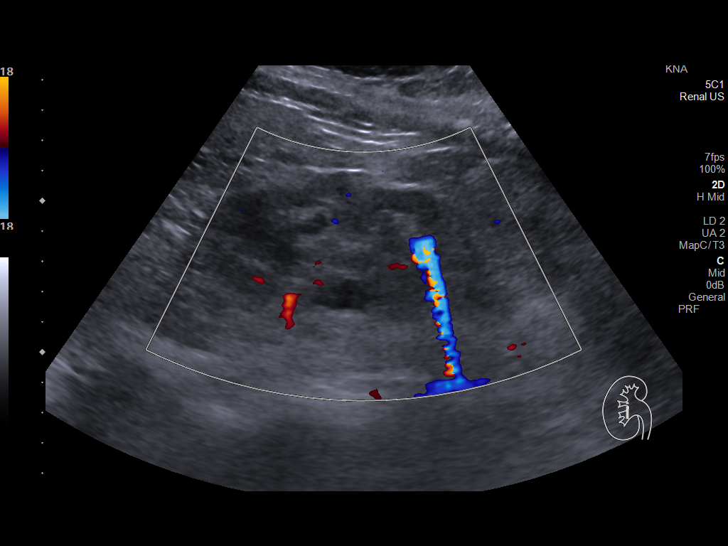
[im 22/86]
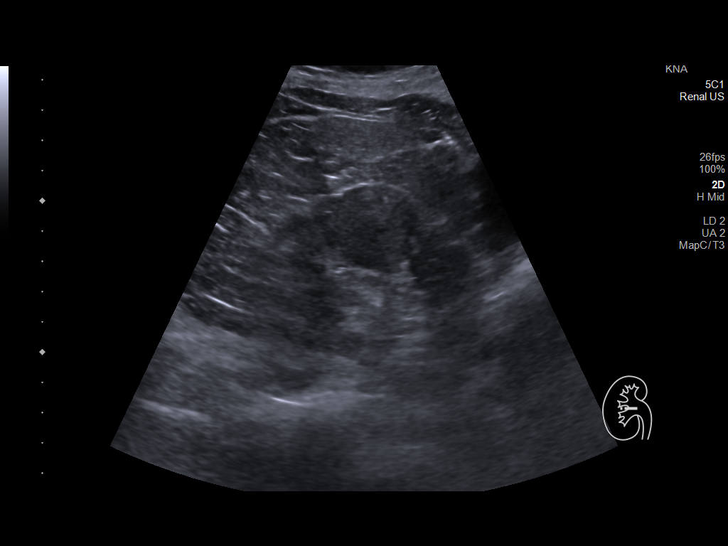
[im 29/86]
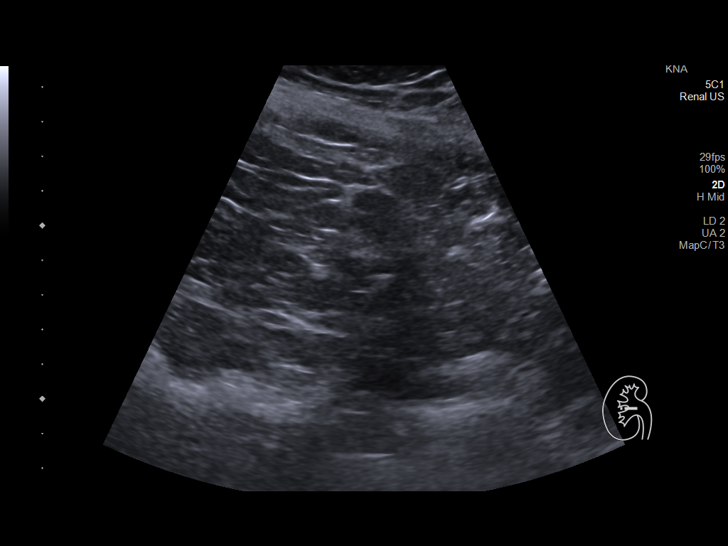
[im 36/86]
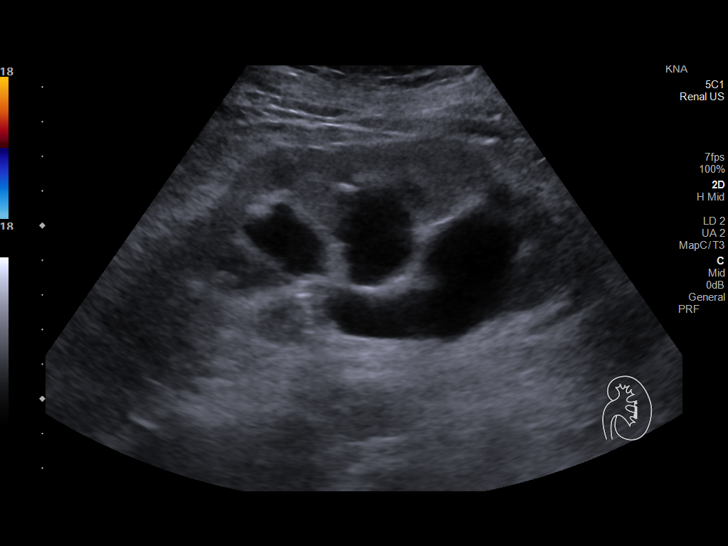
[im 43/86]
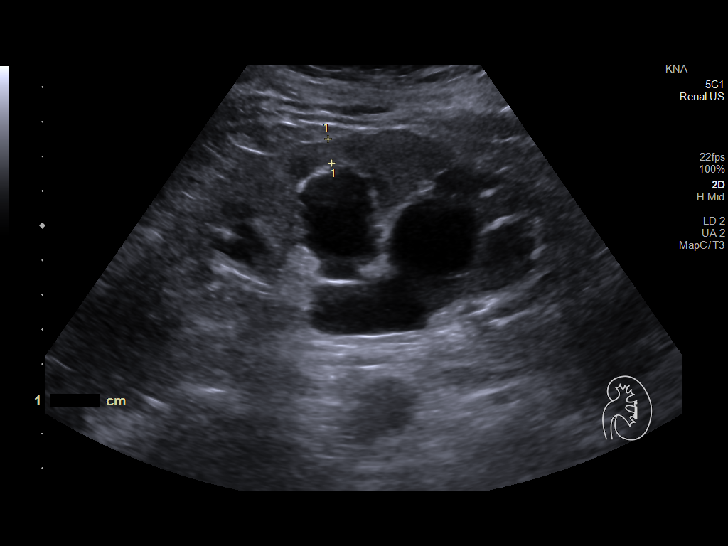
[im 50/86]
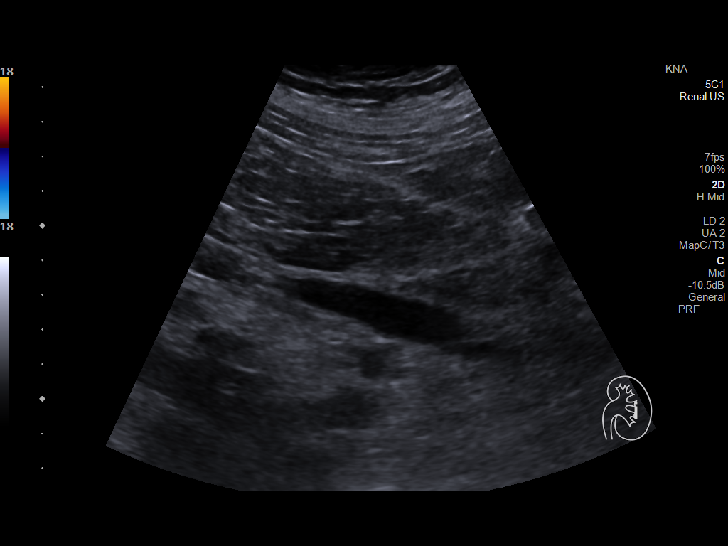
[im 57/86]
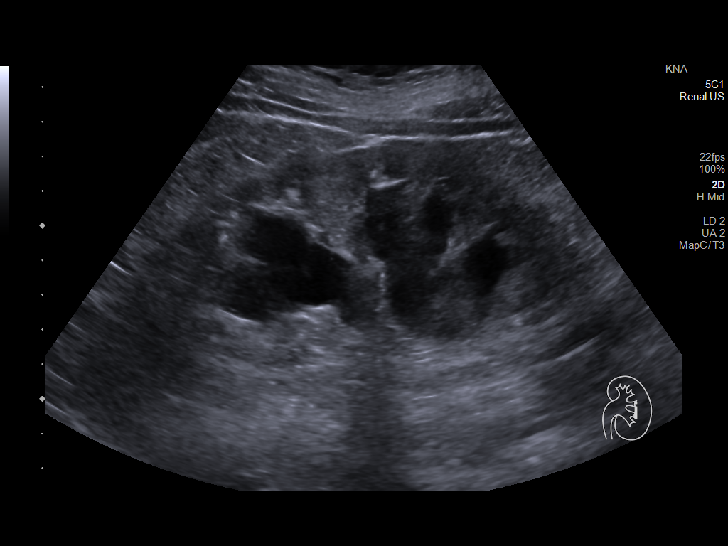
[im 64/86]
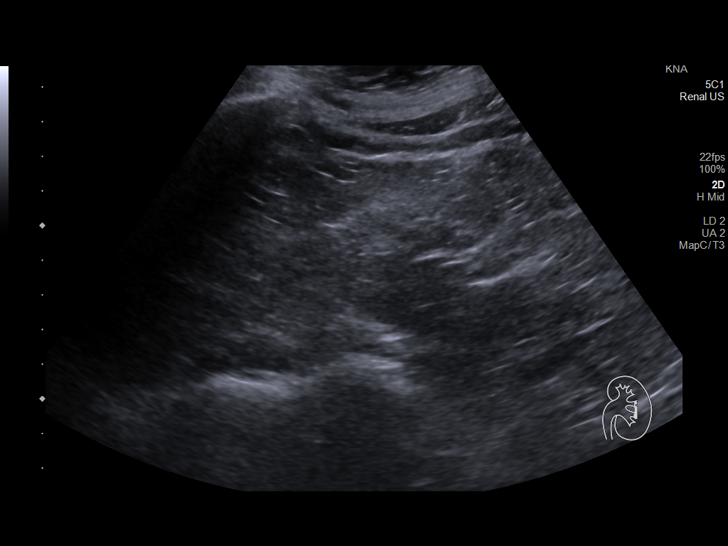
[im 71/86]
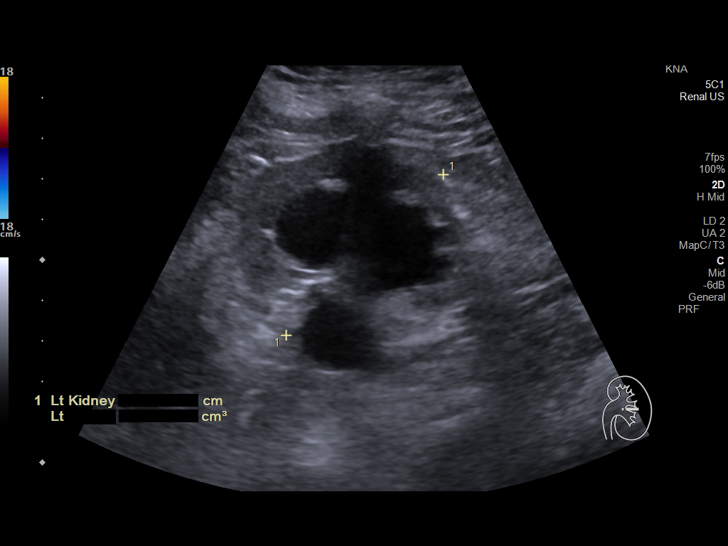
[im 78/86]
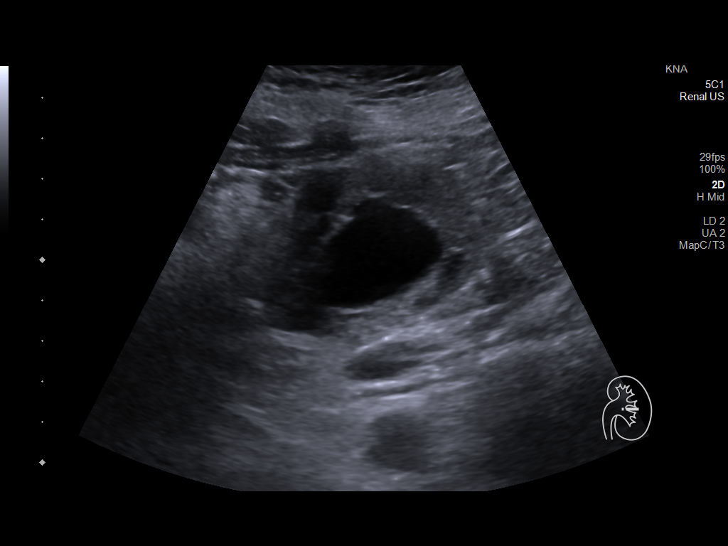
[im 86/86]
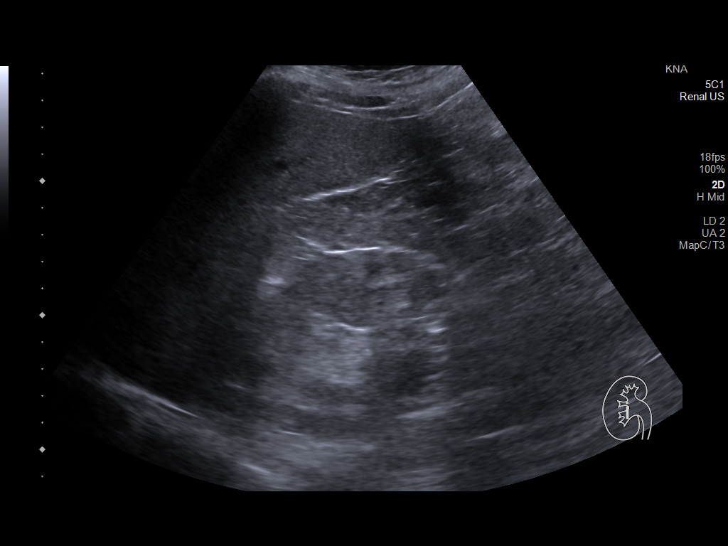

[13 of 25 positions shown; findings below may reference images not displayed]

FINDINGS: Right Kidney:

Renal measurements: 10.8 x 5.5 x 5.4 cm = volume: 166 mL. Cortical
thinning and slight lobulation. Increased cortical echogenicity. No
mass or hydronephrosis. Shadowing calcification at inferior pole 7
mm diameter likely calculus.

Left Kidney:

Renal measurements: 13.9 x 6.4 x 5.6 cm = volume: 259 mL. Cortical
thinning. Moderate hydronephrosis. No definite renal mass. Echogenic
focus 10 mm diameter seen at the mid kidney, without definite
shadowing, uncertain if represents a calculus or an artifact.
Dilatation of the LEFT ureter is visualized though this is not seen
in its entirety to the urinary bladder. No point of obstruction or
calculus identified.

Bladder:

Appears normal for degree of bladder distention. Uterus jets were
noted. Patient was unable to void.

Other:

N/A
IMPRESSION: Moderate LEFT hydronephrosis and hydroureter.

Suspected 7 mm nonobstructing inferior pole RIGHT renal calculus.

Question 10 mm LEFT renal calculus versus artifact.

Medical renal disease changes and cortical thinning of both kidneys.

## 2023-03-10 IMAGING — DX DG ABDOMEN 1V
2 series · 2 of 2 positions shown · non-contrast
Comparison: 02/23/2020

CLINICAL DATA: Bilateral renal calculi

EXAM:
ABDOMEN - 1 VIEW

[abdomen kub (1 of 2)]
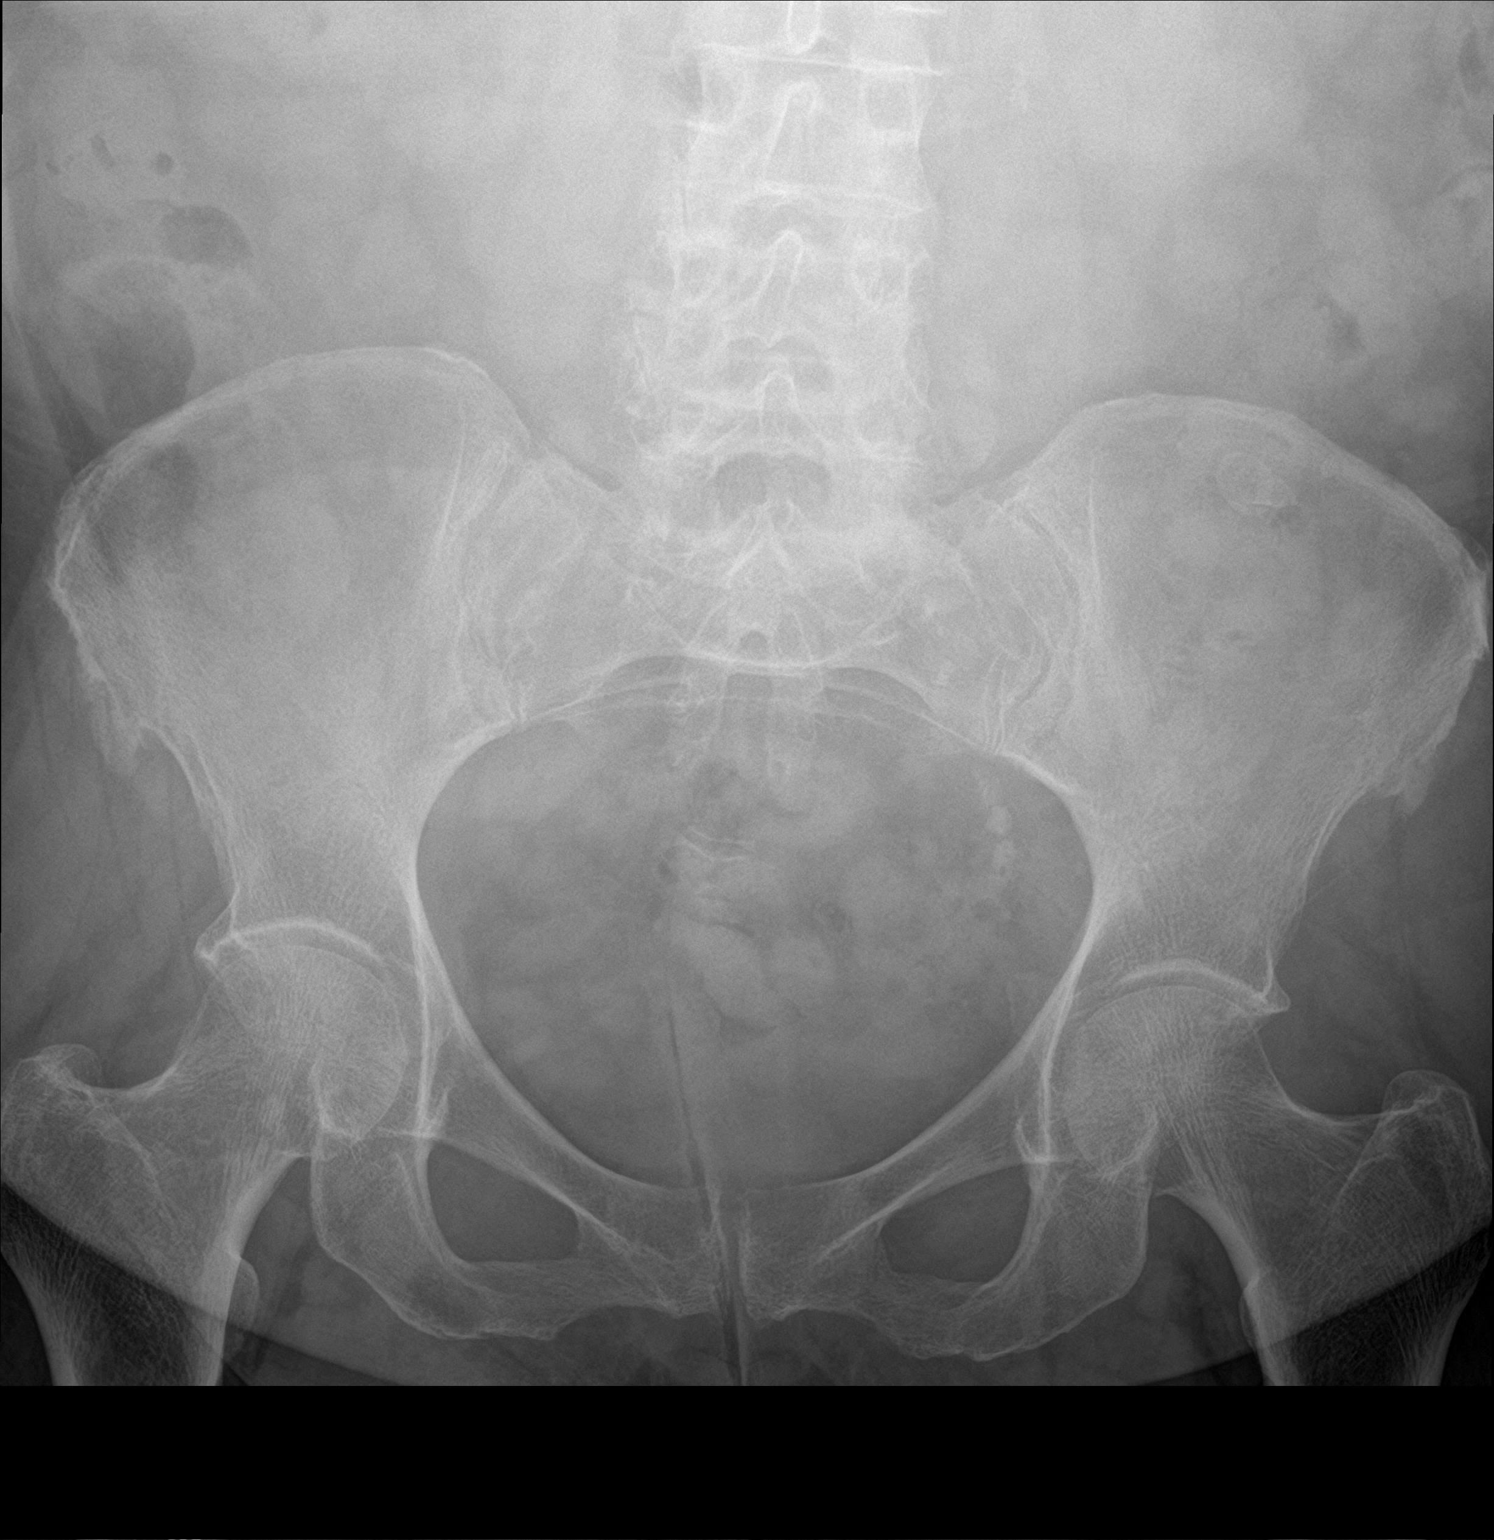

[abdomen kub (2 of 2)]
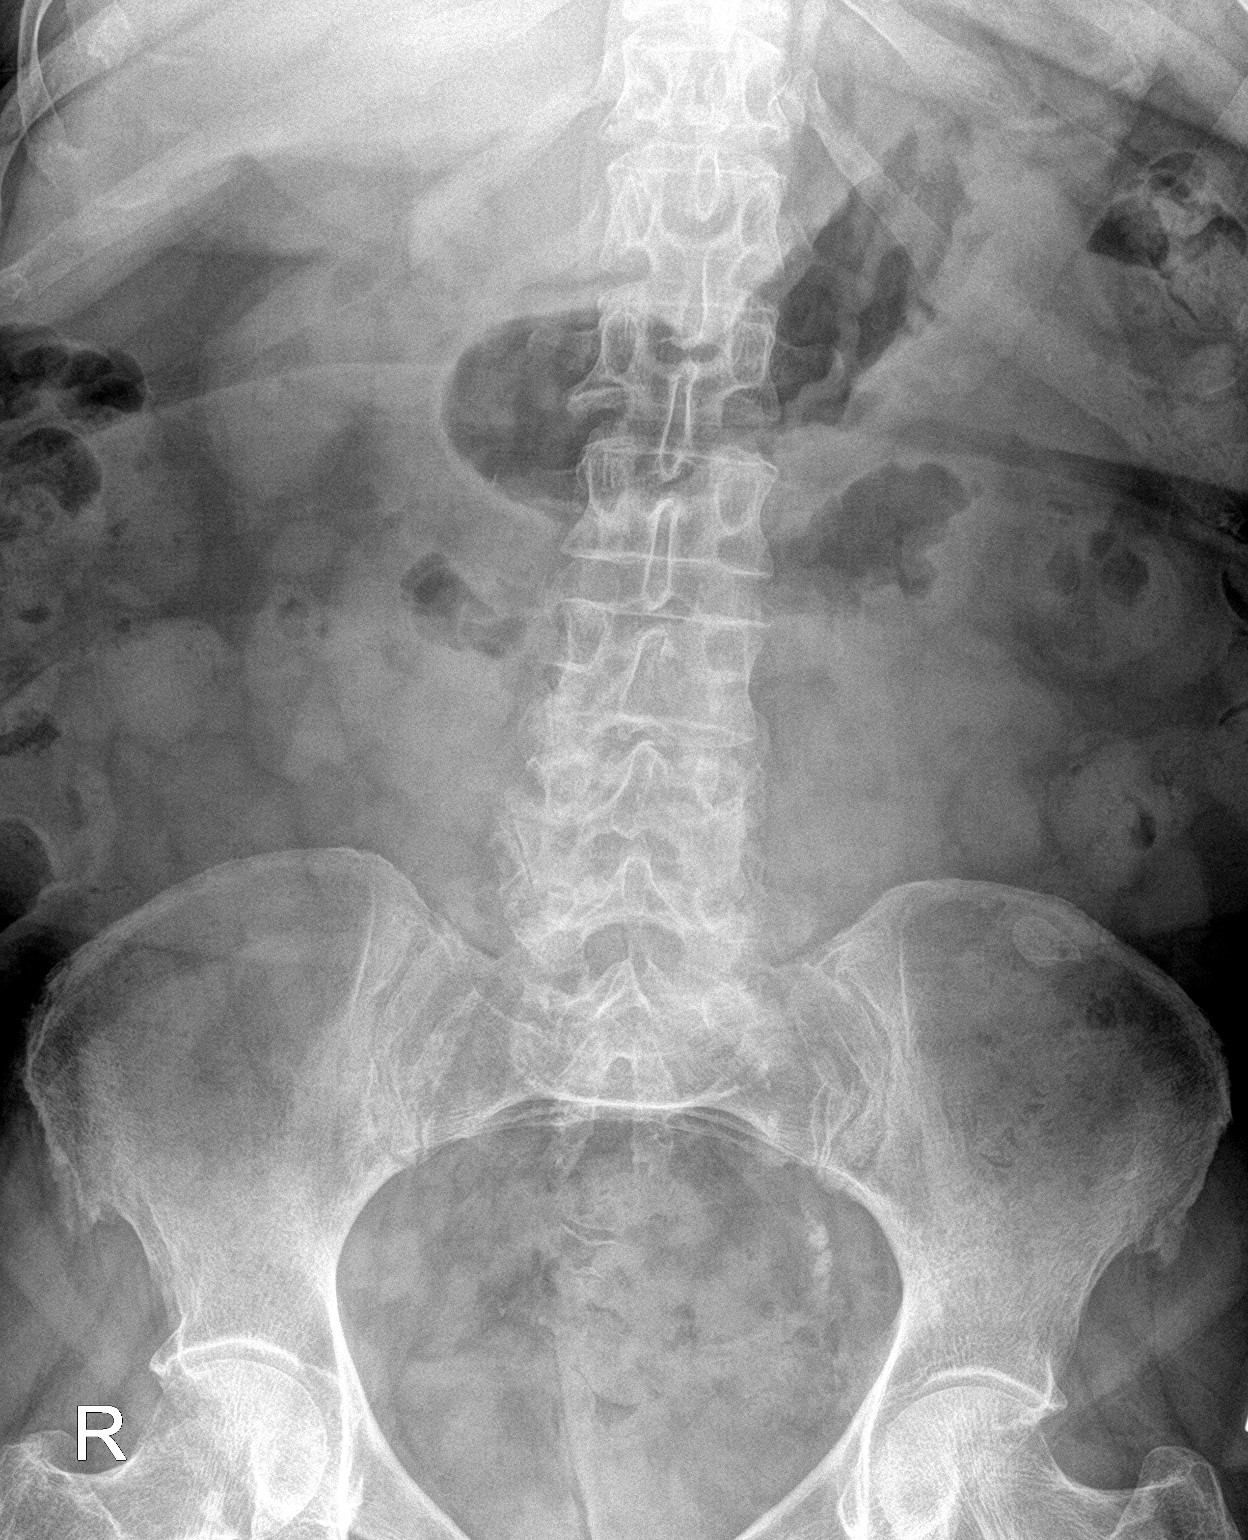

[2 of 2 positions shown; findings below may reference images not displayed]

FINDINGS: 2 supine frontal views of the abdomen and pelvis are obtained. The
bilateral flanks and hemidiaphragms are excluded by collimation.
There are multiple rounded calculi projecting over the distal left
ureter in the left hemipelvis, largest measuring approximately 6 mm
in size, which may reflect fragmented calculi and Steinstrasse.
There are 2 punctate less than 2 mm calcifications overlying the
left renal silhouette. No definite right-sided calculi. No acute
bony abnormalities. Bowel gas pattern is unremarkable.
IMPRESSION: 1. Multiple calcifications overlying the region of the distal left
ureter, measuring up to 6 mm in size, compatible with Steinstrasse.
2. Punctate less than 2 mm left renal calculi as above.

## 2023-03-16 ENCOUNTER — Other Ambulatory Visit: Payer: Self-pay | Admitting: Family Medicine

## 2023-03-16 ENCOUNTER — Other Ambulatory Visit: Payer: Self-pay | Admitting: Orthopedic Surgery

## 2023-03-16 DIAGNOSIS — G8929 Other chronic pain: Secondary | ICD-10-CM

## 2023-03-20 DIAGNOSIS — R0902 Hypoxemia: Secondary | ICD-10-CM | POA: Diagnosis not present

## 2023-03-22 ENCOUNTER — Other Ambulatory Visit: Payer: Self-pay | Admitting: Orthopedic Surgery

## 2023-03-22 DIAGNOSIS — M25512 Pain in left shoulder: Secondary | ICD-10-CM

## 2023-03-30 ENCOUNTER — Other Ambulatory Visit: Payer: Self-pay | Admitting: Family Medicine

## 2023-04-05 IMAGING — MR MR SHOULDER*R* W/O CM
5 series · 40 of 40 positions shown · non-contrast
Comparison: Radiographs 06/23/2020

CLINICAL DATA: Right shoulder pain for 2 months.

EXAM:
MRI OF THE RIGHT SHOULDER WITHOUT CONTRAST
TECHNIQUE: Multiplanar, multisequence MR imaging of the shoulder was performed.
No intravenous contrast was administered.

[Series 5: T2 fat-sat · axial · right · 4.0mm · 0.47mm/px · z∈[-90,+17]mm · 10 of 26 slices shown (1 of 3)]
[im 1/26]
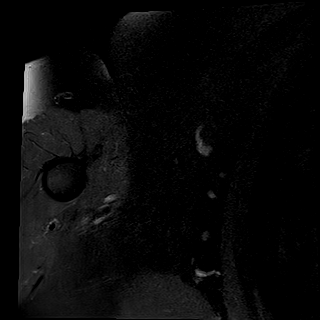
[im 3/26]
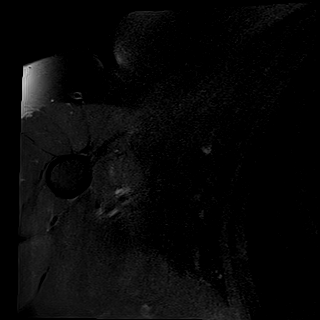
[im 6/26]
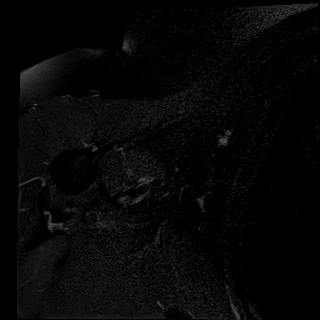
[im 9/26]
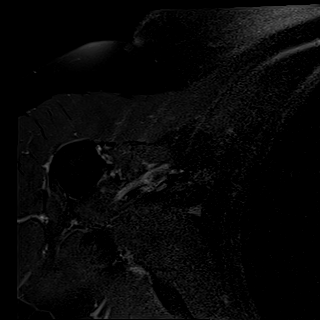
[im 12/26]
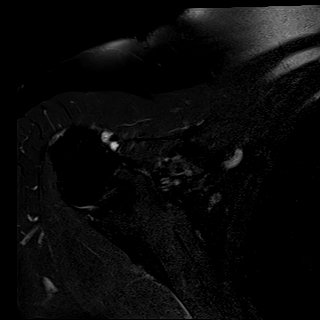
[im 14/26]
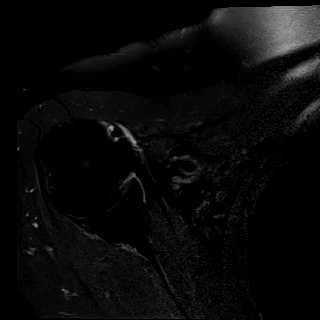
[im 17/26]
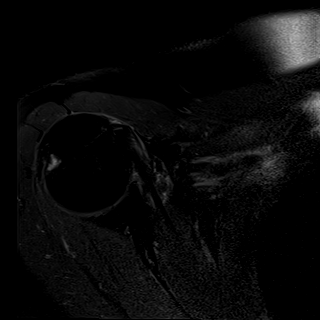
[im 20/26]
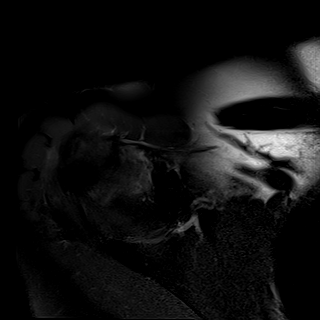
[im 23/26]
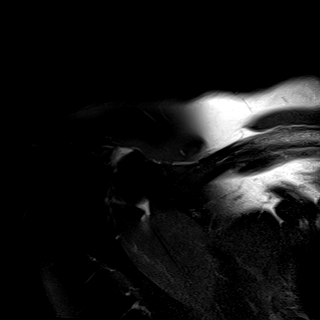
[im 26/26]
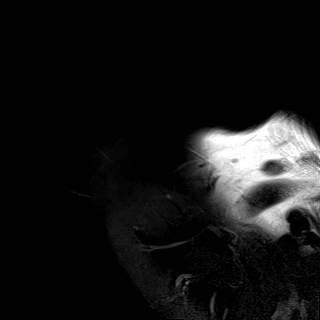

[Series 6: T2 fat-sat · oblique · right · 4.0mm · 0.47mm/px · 7 of 22 slices shown (2 of 3)]
[im 1/22]
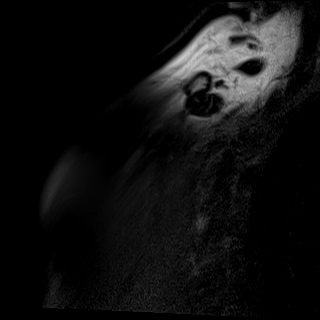
[im 4/22]
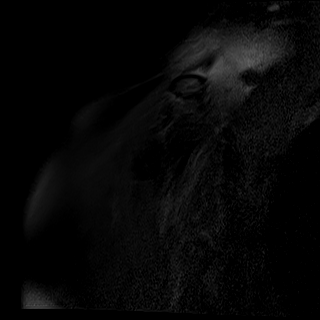
[im 8/22]
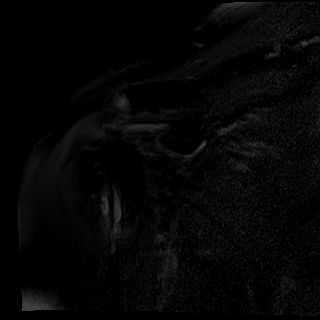
[im 11/22]
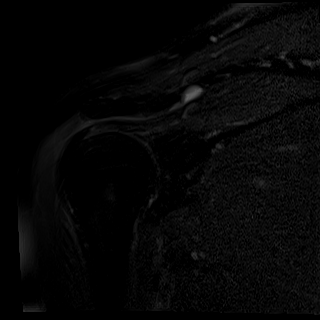
[im 15/22]
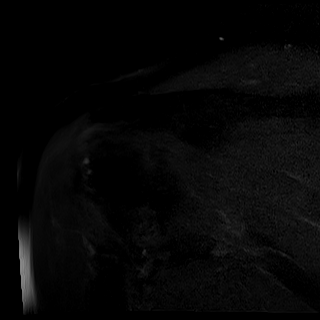
[im 18/22]
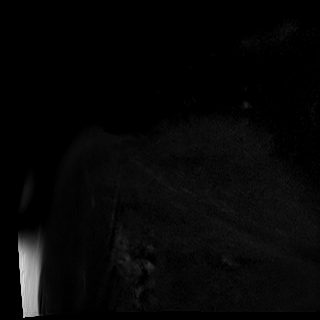
[im 22/22]
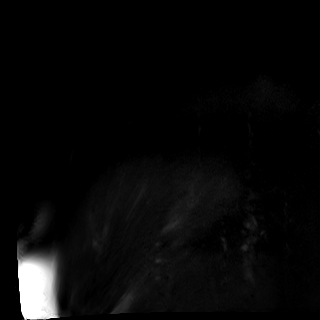

[Series 7: PD · oblique · right · 4.0mm · 0.47mm/px · 7 of 22 slices shown]
[im 1/22]
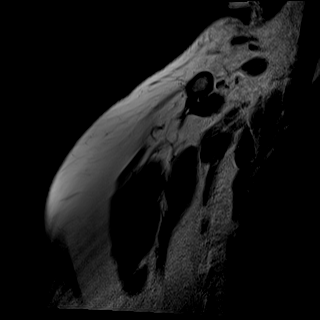
[im 4/22]
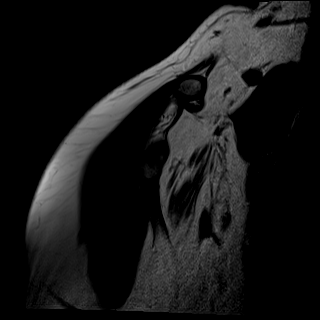
[im 8/22]
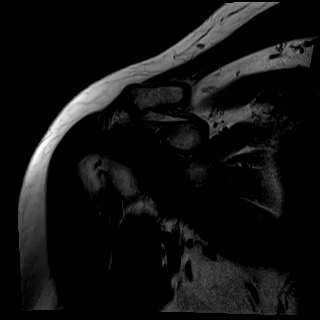
[im 11/22]
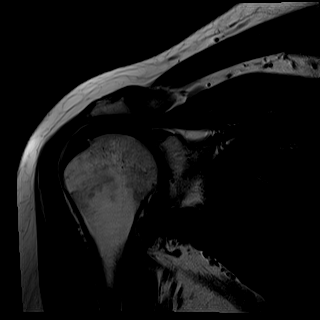
[im 15/22]
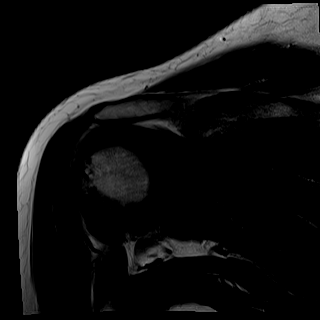
[im 18/22]
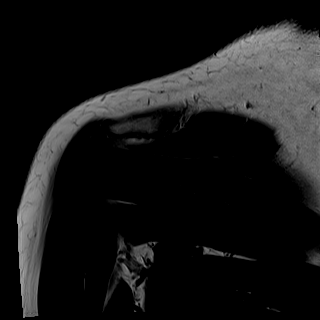
[im 22/22]
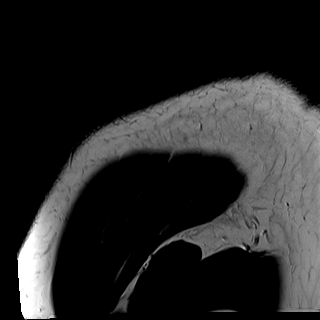

[Series 8: T2 fat-sat · oblique · right · 4.0mm · 0.44mm/px · 8 of 25 slices shown (3 of 3)]
[im 1/25]
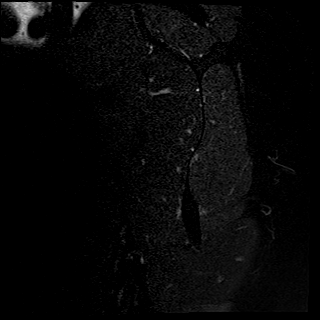
[im 4/25]
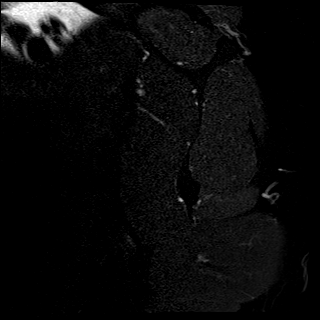
[im 7/25]
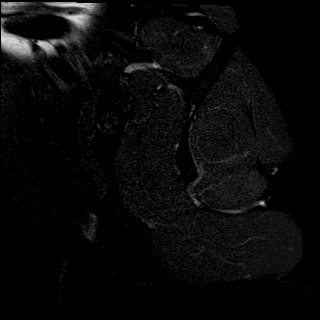
[im 11/25]
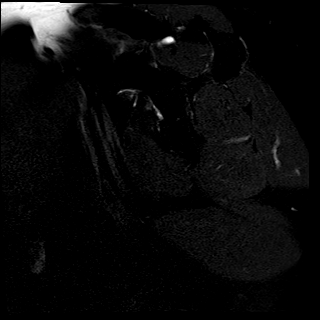
[im 14/25]
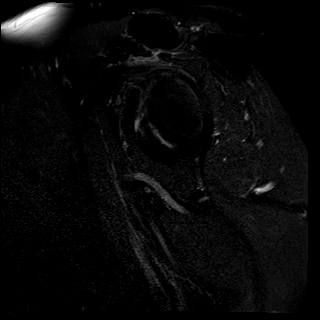
[im 18/25]
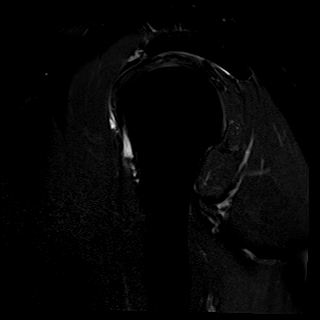
[im 21/25]
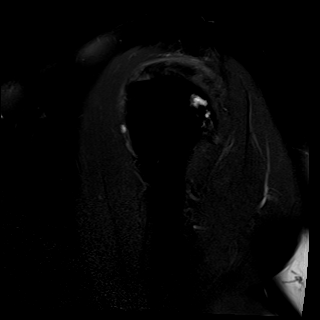
[im 25/25]
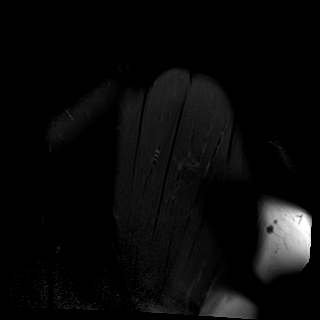

[Series 9: T1 · oblique · right · 4.0mm · 0.47mm/px · 8 of 25 slices shown]
[im 1/25]
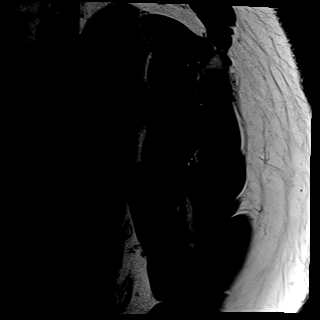
[im 4/25]
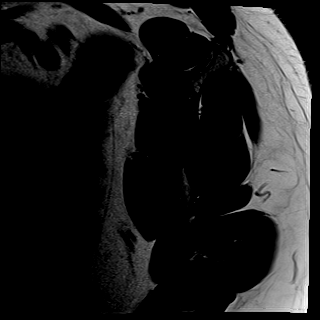
[im 7/25]
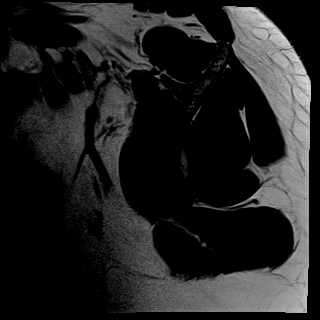
[im 11/25]
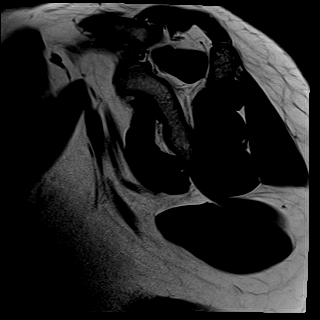
[im 14/25]
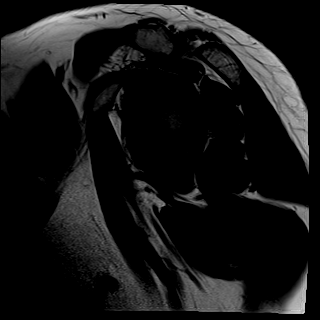
[im 18/25]
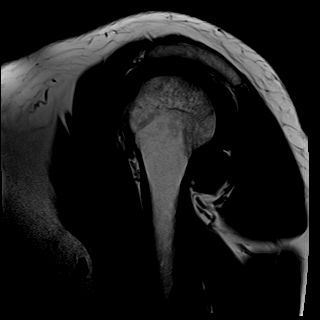
[im 21/25]
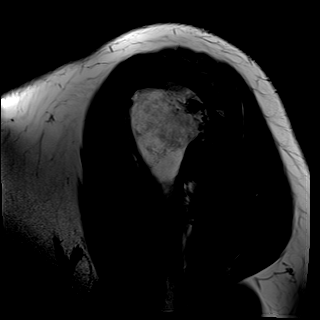
[im 25/25]
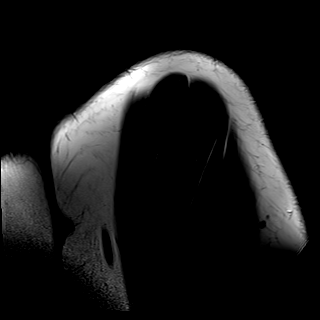

[40 of 40 positions shown; findings below may reference images not displayed]

FINDINGS: Examination is limited due to patient motion. Patient was
uncooperative.

Rotator cuff: Moderate supraspinatus and infraspinatus
tendinopathy/tendinosis with small interstitial tears. There is also
an articular surface tear involving the supraspinatus tendon with
laminar retraction of the articular fibers estimated at 11 mm. This
involves approximately 50% of the depth of the tendon. No discrete
full-thickness retracted tear is identified.

Muscles: There is a ganglion cyst associated with the
musculotendinous junction region of the supraspinatus typically seen
with articular surface tears. No muscle tear or myositis.

Biceps long head: Grossly intact. Tendinopathy involving the
intra-articular portion.

Acromioclavicular Joint: Mild degenerative changes. Type 2-3
acromion. No lateral downsloping or subacromial spurring.

Glenohumeral Joint: Mild degenerative changes with degenerative
chondrosis, early joint space narrowing and early spurring. No joint
effusion or synovitis.

Labrum:  Labral degenerative changes without obvious tear.

Bones:  No acute bony findings.

Other: Mild subacromial/subdeltoid bursitis.
IMPRESSION: 1. Moderate supraspinatus and infraspinatus tendinopathy/tendinosis.
There is also an articular surface tear involving the supraspinatus
tendon with laminar retraction of the articular fibers estimated at
11 mm. No discrete full-thickness retracted tear.
2. Tendinopathy involving the intra-articular portion of the long
head biceps tendon.
3. Labral degenerative changes without obvious tear.
4. Mild glenohumeral joint degenerative changes.
5. Mild subacromial/subdeltoid bursitis.

## 2023-04-20 DIAGNOSIS — R0902 Hypoxemia: Secondary | ICD-10-CM | POA: Diagnosis not present

## 2023-04-23 ENCOUNTER — Ambulatory Visit: Payer: 59 | Admitting: Family Medicine

## 2023-04-25 ENCOUNTER — Ambulatory Visit (INDEPENDENT_AMBULATORY_CARE_PROVIDER_SITE_OTHER): Payer: 59 | Admitting: Family Medicine

## 2023-04-25 ENCOUNTER — Other Ambulatory Visit (HOSPITAL_COMMUNITY): Payer: Self-pay | Admitting: Family Medicine

## 2023-04-25 ENCOUNTER — Encounter: Payer: Self-pay | Admitting: Family Medicine

## 2023-04-25 VITALS — BP 110/68 | HR 96 | Ht 66.0 in | Wt 182.0 lb

## 2023-04-25 DIAGNOSIS — F411 Generalized anxiety disorder: Secondary | ICD-10-CM

## 2023-04-25 DIAGNOSIS — Z23 Encounter for immunization: Secondary | ICD-10-CM | POA: Diagnosis not present

## 2023-04-25 DIAGNOSIS — E1121 Type 2 diabetes mellitus with diabetic nephropathy: Secondary | ICD-10-CM | POA: Diagnosis not present

## 2023-04-25 DIAGNOSIS — Z1231 Encounter for screening mammogram for malignant neoplasm of breast: Secondary | ICD-10-CM

## 2023-04-25 DIAGNOSIS — F324 Major depressive disorder, single episode, in partial remission: Secondary | ICD-10-CM

## 2023-04-25 DIAGNOSIS — I10 Essential (primary) hypertension: Secondary | ICD-10-CM | POA: Diagnosis not present

## 2023-04-25 DIAGNOSIS — I152 Hypertension secondary to endocrine disorders: Secondary | ICD-10-CM

## 2023-04-25 DIAGNOSIS — E1159 Type 2 diabetes mellitus with other circulatory complications: Secondary | ICD-10-CM | POA: Diagnosis not present

## 2023-04-25 DIAGNOSIS — E785 Hyperlipidemia, unspecified: Secondary | ICD-10-CM | POA: Diagnosis not present

## 2023-04-25 NOTE — Patient Instructions (Addendum)
Annual exam   early February, call if you need me sooner  Good foot exam today  Flu vaccine today  Labs today as ordered  Mammogram due in January , pls schedule at checkout  Thanks for choosing [facility], we consider it a privelige to serve you.

## 2023-04-27 LAB — HEMOGLOBIN A1C
Est. average glucose Bld gHb Est-mCnc: 123 mg/dL
Hgb A1c MFr Bld: 5.9 % — ABNORMAL HIGH (ref 4.8–5.6)

## 2023-04-27 LAB — CBC
Hematocrit: 33 % — ABNORMAL LOW (ref 34.0–46.6)
Hemoglobin: 10.3 g/dL — ABNORMAL LOW (ref 11.1–15.9)
MCH: 28.9 pg (ref 26.6–33.0)
MCHC: 31.2 g/dL — ABNORMAL LOW (ref 31.5–35.7)
MCV: 92 fL (ref 79–97)
Platelets: 395 10*3/uL (ref 150–450)
RBC: 3.57 x10E6/uL — ABNORMAL LOW (ref 3.77–5.28)
RDW: 18.1 % — ABNORMAL HIGH (ref 11.7–15.4)
WBC: 9.8 10*3/uL (ref 3.4–10.8)

## 2023-04-27 LAB — CMP14+EGFR
ALT: 11 IU/L (ref 0–32)
AST: 16 [IU]/L (ref 0–40)
Albumin: 4 g/dL (ref 3.8–4.8)
Alkaline Phosphatase: 105 IU/L (ref 44–121)
BUN/Creatinine Ratio: 10 — ABNORMAL LOW (ref 12–28)
BUN: 16 mg/dL (ref 8–27)
Bilirubin Total: <0.2 mg/dL (ref 0.0–1.2)
CO2: 22 mmol/L (ref 20–29)
Calcium: 10.1 mg/dL (ref 8.7–10.3)
Chloride: 101 mmol/L (ref 96–106)
Creatinine, Ser: 1.68 mg/dL — ABNORMAL HIGH (ref 0.57–1.00)
Globulin, Total: 4.1 g/dL (ref 1.5–4.5)
Glucose: 98 mg/dL (ref 70–99)
Potassium: 4 mmol/L (ref 3.5–5.2)
Sodium: 138 mmol/L (ref 134–144)
Total Protein: 8.1 g/dL (ref 6.0–8.5)
eGFR: 31 mL/min/{1.73_m2} — ABNORMAL LOW (ref >59)

## 2023-04-27 LAB — LIPID PANEL
Chol/HDL Ratio: 3.1 ratio (ref 0.0–4.4)
Cholesterol, Total: 137 mg/dL (ref 100–199)
HDL: 44 mg/dL (ref >39)
LDL Chol Calc (NIH): 44 mg/dL (ref 0–99)
Triglycerides: 331 mg/dL — ABNORMAL HIGH (ref 0–149)
VLDL Cholesterol Cal: 49 mg/dL — ABNORMAL HIGH (ref 5–40)

## 2023-04-27 LAB — MICROALBUMIN / CREATININE URINE RATIO
Creatinine, Urine: 114.5 mg/dL
Microalb/Creat Ratio: 32 mg/g{creat} — ABNORMAL HIGH (ref 0–29)
Microalbumin, Urine: 37.1 ug/mL

## 2023-04-27 MED ORDER — EZETIMIBE 10 MG PO TABS
10.0000 mg | ORAL_TABLET | Freq: Every day | ORAL | 1 refills | Status: DC
Start: 2023-04-30 — End: 2023-09-16

## 2023-04-29 DIAGNOSIS — Z23 Encounter for immunization: Secondary | ICD-10-CM | POA: Insufficient documentation

## 2023-04-29 NOTE — Addendum Note (Signed)
Addended by: Syliva Overman E on: 04/29/2023 12:41 AM   Modules accepted: Level of Service

## 2023-04-29 NOTE — Assessment & Plan Note (Signed)
Controlled, no change in medication DASH diet and commitment to daily physical activity for a minimum of 30 minutes discussed and encouraged, as a part of hypertension management. The importance of attaining a healthy weight is also discussed.     04/25/2023    4:06 PM 02/25/2023   11:29 AM 02/13/2023   11:09 AM 11/21/2022   10:34 AM 11/20/2022   10:24 AM 10/25/2022   11:37 AM 10/25/2022   11:16 AM  BP/Weight  Systolic BP 110 130 109 119 113 119 121  Diastolic BP 68 72 66 71 72 74 75  Wt. (Lbs) 182.04 185.2  181.2 181.12    BMI 29.38 kg/m2 29.44 kg/m2  29.25 kg/m2 29.23 kg/m2

## 2023-04-29 NOTE — Assessment & Plan Note (Signed)
Controlled, no change in medication  

## 2023-04-29 NOTE — Assessment & Plan Note (Signed)
Hyperlipidemia:Low fat diet discussed and encouraged.   Lipid Panel  Lab Results  Component Value Date   CHOL 137 04/25/2023   HDL 44 04/25/2023   LDLCALC 44 04/25/2023   TRIG 331 (H) 04/25/2023   CHOLHDL 3.1 04/25/2023     TG markedly elvated , need to add zetia

## 2023-04-29 NOTE — Assessment & Plan Note (Addendum)
Cheryl Abbott is reminded of the importance of commitment to daily physical activity for 30 minutes or more, as able and the need to limit carbohydrate intake to 30 to 60 grams per meal to help with blood sugar control.  Improved, no med change  The need to take medication as prescribed, test blood sugar as directed, and to call between visits if there is a concern that blood sugar is uncontrolled is also discussed.   Cheryl Abbott is reminded of the importance of daily foot exam, annual eye examination, and good blood sugar, blood pressure and cholesterol control.     Latest Ref Rng & Units 04/25/2023    4:48 PM 11/21/2022    8:00 AM 10/19/2022    4:09 AM 10/18/2022    5:05 AM 10/17/2022    1:34 PM  Diabetic Labs  HbA1c 4.8 - 5.6 % 5.9  6.3      Micro/Creat Ratio 0 - 29 mg/g creat 32       Chol 100 - 199 mg/dL 409  811      HDL >91 mg/dL 44  47      Calc LDL 0 - 99 mg/dL 44  60      Triglycerides 0 - 149 mg/dL 478  95      Creatinine 0.57 - 1.00 mg/dL 2.95  6.21  3.08  6.57  1.60       04/25/2023    4:06 PM 02/25/2023   11:29 AM 02/13/2023   11:09 AM 11/21/2022   10:34 AM 11/20/2022   10:24 AM 10/25/2022   11:37 AM 10/25/2022   11:16 AM  BP/Weight  Systolic BP 110 130 109 119 113 119 121  Diastolic BP 68 72 66 71 72 74 75  Wt. (Lbs) 182.04 185.2  181.2 181.12    BMI 29.38 kg/m2 29.44 kg/m2  29.25 kg/m2 29.23 kg/m2        Latest Ref Rng & Units 08/20/2022   12:00 AM 08/15/2021   12:00 AM  Foot/eye exam completion dates  Eye Exam No Retinopathy No Retinopathy     No Retinopathy         This result is from an external source.

## 2023-04-29 NOTE — Progress Notes (Addendum)
Cheryl Abbott     MRN: [id number]      DOB: 19-Jan-1946  Chief Complaint  Patient presents with   Follow-up    Follow up tired    HPI [name] is here for follow up and re-evaluation of chronic medical conditions, medication management and review of any available recent lab and radiology data.  Preventive health is updated, specifically  Cancer screening and Immunization.   Questions or concerns regarding consultations or procedures which the PT has had in the interim are  addressed. The PT denies any adverse reactions to current medications since the last visit.  There are no new concerns.  There are no specific complaints   ROS Denies recent fever or chills.c/o fatigue Denies sinus pressure, nasal congestion, ear pain or sore throat. Denies chest congestion, productive cough or wheezing. Denies chest pains, palpitations and leg swelling Denies abdominal pain, nausea, vomiting,diarrhea or constipation.   Denies dysuria, frequency, hesitancy or incontinence. Denies uncontrolled  joint pain, swelling and limitation in mobility. Denies headaches, seizures, numbness, or tingling. Denies  uncontrolled depression, anxiety or insomnia. Denies skin break down or rash.   PE  BP 110/68 (BP Location: Right Arm, Patient Position: Sitting, Cuff Size: Large)   Pulse 96   Ht 5\' 6"  (1.676 m)   Wt 182 lb 0.6 oz (82.6 kg)   SpO2 98%   BMI 29.38 kg/m   Patient alert and oriented and in no cardiopulmonary distress.  HEENT: No facial asymmetry, EOMI,     Neck supple .  Chest: Clear to auscultation bilaterally.  CVS: S1, S2 no murmurs, no S3.Regular rate.  ABD: Soft non tender.   Ext: No edema  MS: Decreased  ROM spine,  normal in shoulders, hips and knees.  Skin: Intact, no ulcerations or rash noted.  Psych: Good eye contact, flat  affect. Memory intact not anxious or depressed appearing.  CNS: CN 2-12 intact, power,  normal throughout.no focal deficits  noted.   Assessment & Plan  Encounter for Medicare annual examination with abnormal findings Annual exam as documented.  Immunization and cancer screening needs are specifically addressed at this visit.   Encounter for immunization After obtaining informed consent, the influenza vaccine is  administered , with no adverse effect noted at the time of administration.   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 is reminded of the importance of commitment to daily physical activity for 30 minutes or more, as able and the need to limit carbohydrate intake to 30 to 60 grams per meal to help with blood sugar control.  Improved, no med change  The need to take medication as prescribed, test blood sugar as directed, and to call between visits if there is a concern that blood sugar is uncontrolled is also discussed.   [name] is reminded of the importance of daily foot exam, annual eye examination, and good blood sugar, blood pressure and cholesterol control.     Latest Ref Rng & Units 04/25/2023    4:48 PM 11/21/2022    8:00 AM 10/19/2022    4:09 AM 10/18/2022    5:05 AM 10/17/2022    1:34 PM  Diabetic Labs  HbA1c 4.8 - 5.6 % 5.9  6.3      Micro/Creat Ratio 0 - 29 mg/g creat 32       Chol 100 - 199 mg/dL 696  295      HDL >28 mg/dL 44  47      Calc LDL 0 -  99 mg/dL 44  60      Triglycerides 0 - 149 mg/dL 742  95      Creatinine 0.57 - 1.00 mg/dL 5.95  6.38  7.56  4.33  1.60       04/25/2023    4:06 PM 02/25/2023   11:29 AM 02/13/2023   11:09 AM 11/21/2022   10:34 AM 11/20/2022   10:24 AM 10/25/2022   11:37 AM 10/25/2022   11:16 AM  BP/Weight  Systolic BP 110 130 109 119 113 119 121  Diastolic BP 68 72 66 71 72 74 75  Wt. (Lbs) 182.04 185.2  181.2 181.12    BMI 29.38 kg/m2 29.44 kg/m2  29.25 kg/m2 29.23 kg/m2        Latest Ref Rng & Units 08/20/2022   12:00 AM 08/15/2021   12:00 AM  Foot/eye exam completion dates  Eye Exam No Retinopathy No Retinopathy     No Retinopathy          This result is from an external source.        Hypertension goal BP (blood pressure) < 130/80 Controlled, no change in medication DASH diet and commitment to daily physical activity for a minimum of 30 minutes discussed and encouraged, as a part of hypertension management. The importance of attaining a healthy weight is also discussed.     04/25/2023    4:06 PM 02/25/2023   11:29 AM 02/13/2023   11:09 AM 11/21/2022   10:34 AM 11/20/2022   10:24 AM 10/25/2022   11:37 AM 10/25/2022   11:16 AM  BP/Weight  Systolic BP 110 130 109 119 113 119 121  Diastolic BP 68 72 66 71 72 74 75  Wt. (Lbs) 182.04 185.2  181.2 181.12    BMI 29.38 kg/m2 29.44 kg/m2  29.25 kg/m2 29.23 kg/m2         Hyperlipidemia LDL goal <100 Hyperlipidemia:Low fat diet discussed and encouraged.   Lipid Panel  Lab Results  Component Value Date   CHOL 137 04/25/2023   HDL 44 04/25/2023   LDLCALC 44 04/25/2023   TRIG 331 (H) 04/25/2023   CHOLHDL 3.1 04/25/2023     TG markedly elvated , need to add zetia  Depression, major, single episode, in partial remission (HCC) Controlled, no change in medication   Generalized anxiety disorder Controlled, no change in medication

## 2023-04-29 NOTE — Assessment & Plan Note (Signed)
Annual exam as documented. . Immunization and cancer screening needs are specifically addressed at this visit.  

## 2023-04-29 NOTE — Assessment & Plan Note (Signed)
After obtaining informed consent, the influenza vaccine is  administered , with no adverse effect noted at the time of administration.

## 2023-05-02 ENCOUNTER — Other Ambulatory Visit: Payer: Self-pay | Admitting: Orthopedic Surgery

## 2023-05-02 DIAGNOSIS — L708 Other acne: Secondary | ICD-10-CM | POA: Diagnosis not present

## 2023-05-02 DIAGNOSIS — M25512 Pain in left shoulder: Secondary | ICD-10-CM

## 2023-05-18 ENCOUNTER — Other Ambulatory Visit: Payer: Self-pay | Admitting: Cardiology

## 2023-05-20 DIAGNOSIS — R0902 Hypoxemia: Secondary | ICD-10-CM | POA: Diagnosis not present

## 2023-05-25 IMAGING — DX DG ABDOMEN 1V
2 series · 2 of 2 positions shown · non-contrast
Comparison: X-ray abdomen 09/28/2020,

CLINICAL DATA: Kidney stone status post removal. Question migrated
stent.

EXAM:
ABDOMEN - 1 VIEW

[abdomen kub (1 of 2)]
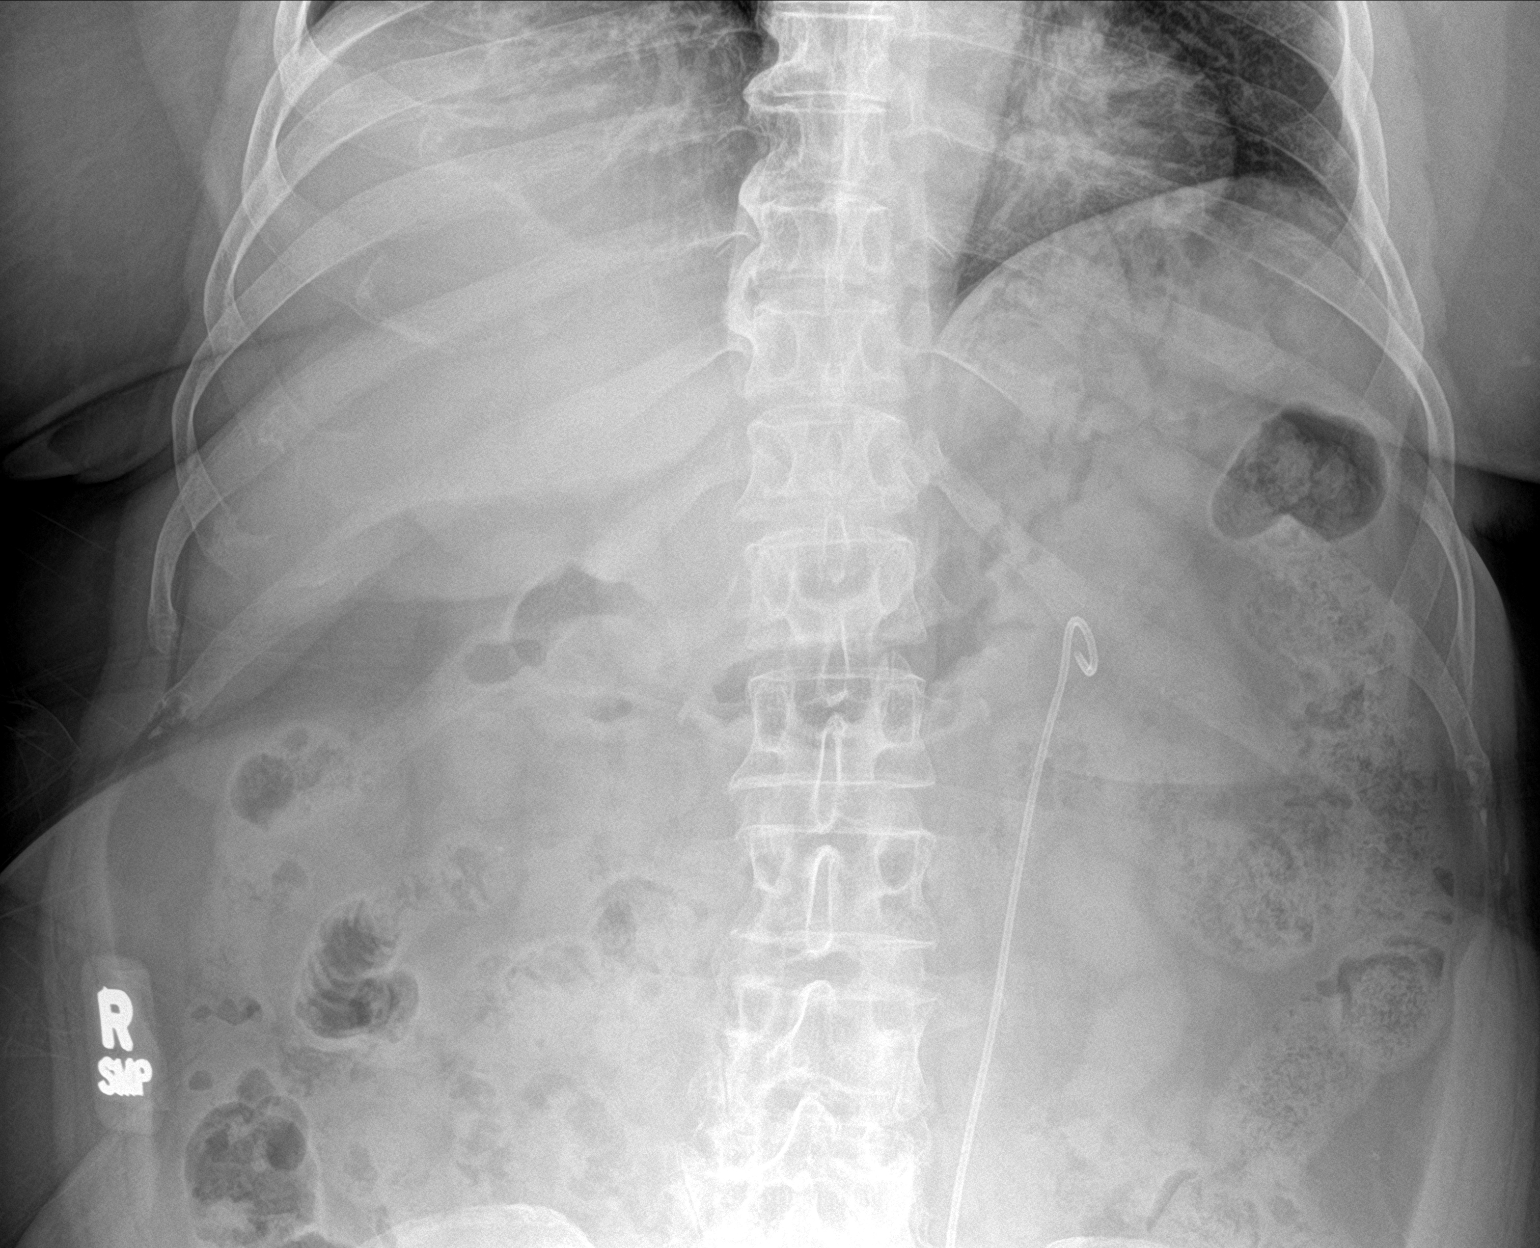

[abdomen kub (2 of 2)]
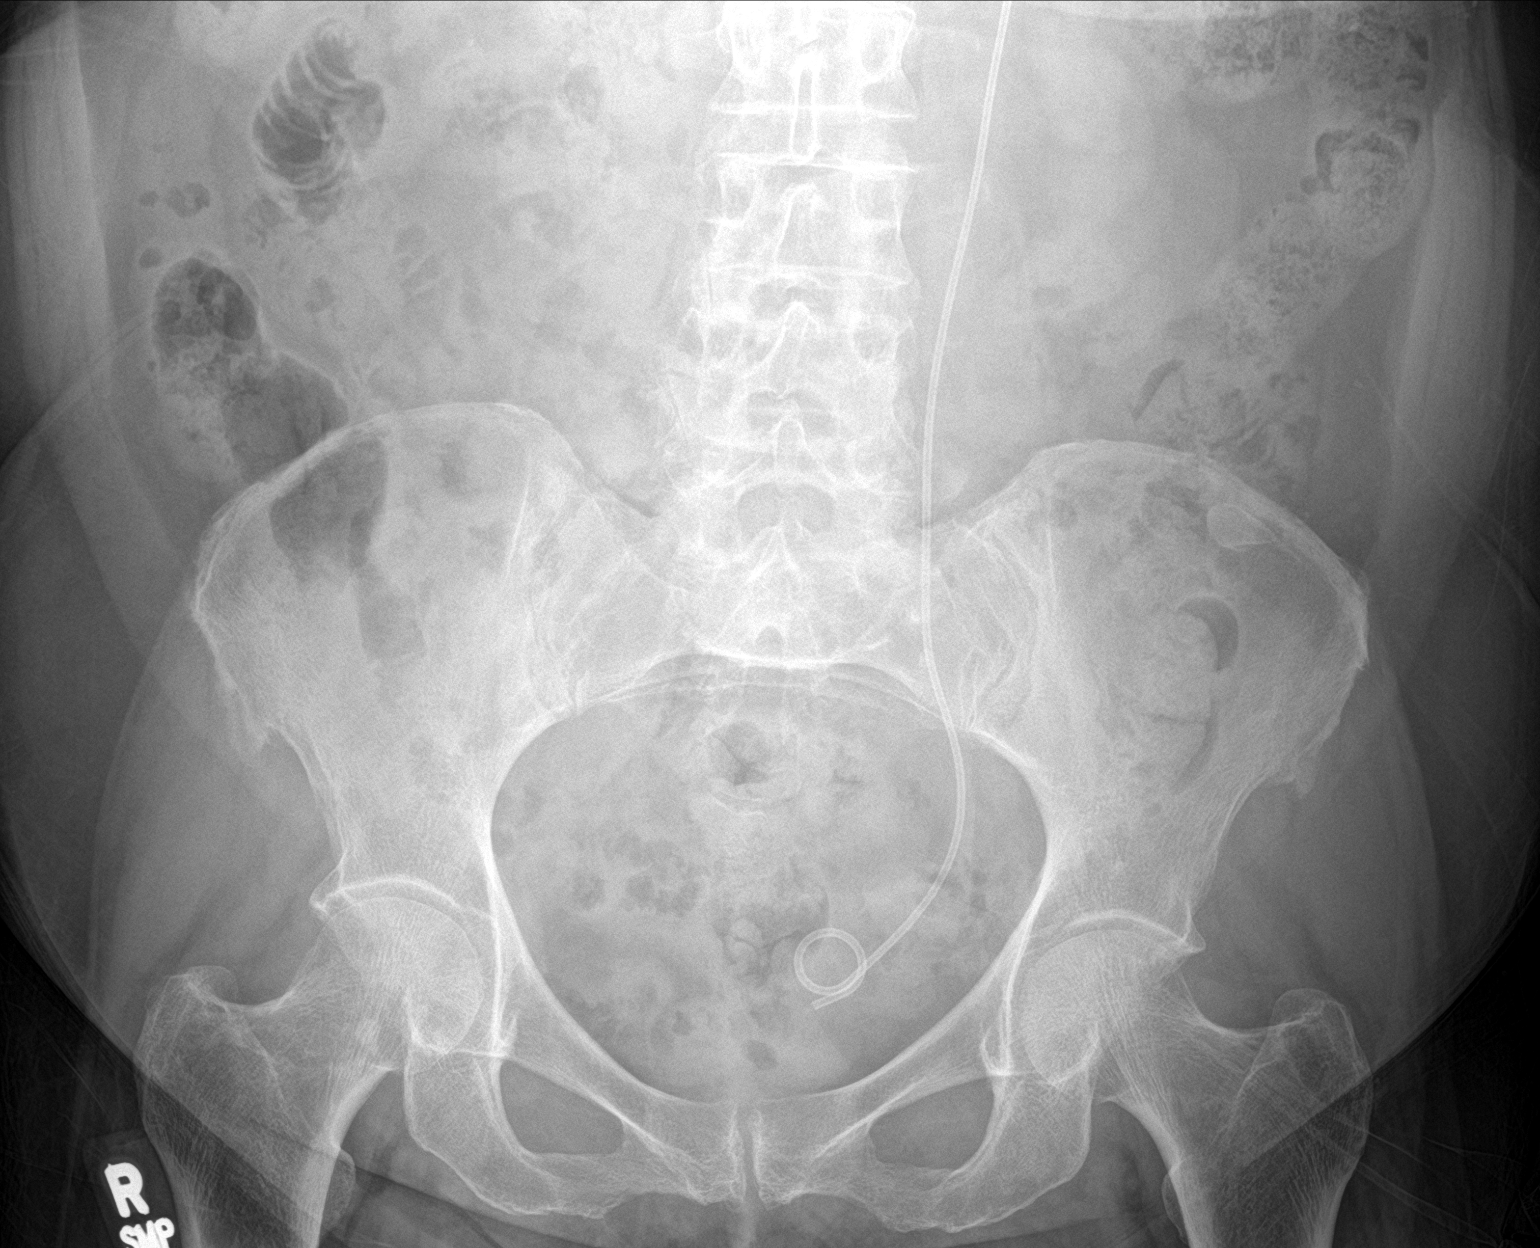

[2 of 2 positions shown; findings below may reference images not displayed]

FINDINGS: Left ureteral stent with proximal pigtail overlying the expected
region of the renal shadow and distal pigtail overlying the expected
region of the urinary bladder. The bowel gas pattern is normal. No
radio-opaque calculi or other significant radiographic abnormality
are seen.
IMPRESSION: Left ureteral stent with proximal pigtail overlying the expected
region of the left renal shadow and distal pigtail overlying the
expected region of the urinary bladder. Markedly limited evaluation
of exact positioning due to bowel gas overlying the abdomen and
pelvis as well as no post stent placement comparison images. If
continued concern, recommend CT pelvis for further evaluation.

## 2023-06-17 ENCOUNTER — Other Ambulatory Visit: Payer: Self-pay | Admitting: Orthopedic Surgery

## 2023-06-17 DIAGNOSIS — M25512 Pain in left shoulder: Secondary | ICD-10-CM

## 2023-06-17 MED ORDER — TRAMADOL HCL 50 MG PO TABS
50.0000 mg | ORAL_TABLET | Freq: Four times a day (QID) | ORAL | 0 refills | Status: DC | PRN
Start: 2023-06-17 — End: 2023-06-19

## 2023-06-19 ENCOUNTER — Encounter: Payer: Self-pay | Admitting: Internal Medicine

## 2023-06-19 ENCOUNTER — Telehealth: Payer: Self-pay | Admitting: Family Medicine

## 2023-06-19 ENCOUNTER — Ambulatory Visit (HOSPITAL_COMMUNITY)
Admission: RE | Admit: 2023-06-19 | Discharge: 2023-06-19 | Disposition: A | Discharge status: Home / Self Care | Payer: 59 | Source: Ambulatory Visit | Attending: Internal Medicine | Admitting: Internal Medicine

## 2023-06-19 ENCOUNTER — Other Ambulatory Visit: Payer: Self-pay | Admitting: Orthopedic Surgery

## 2023-06-19 ENCOUNTER — Ambulatory Visit (INDEPENDENT_AMBULATORY_CARE_PROVIDER_SITE_OTHER): Payer: 59 | Admitting: Internal Medicine

## 2023-06-19 VITALS — BP 121/74 | HR 97 | Ht 66.0 in | Wt 181.4 lb

## 2023-06-19 DIAGNOSIS — K5730 Diverticulosis of large intestine without perforation or abscess without bleeding: Secondary | ICD-10-CM | POA: Diagnosis not present

## 2023-06-19 DIAGNOSIS — N200 Calculus of kidney: Secondary | ICD-10-CM | POA: Diagnosis not present

## 2023-06-19 DIAGNOSIS — R1031 Right lower quadrant pain: Secondary | ICD-10-CM | POA: Insufficient documentation

## 2023-06-19 DIAGNOSIS — K5909 Other constipation: Secondary | ICD-10-CM | POA: Insufficient documentation

## 2023-06-19 DIAGNOSIS — M25512 Pain in left shoulder: Secondary | ICD-10-CM

## 2023-06-19 DIAGNOSIS — N183 Chronic kidney disease, stage 3 unspecified: Secondary | ICD-10-CM | POA: Diagnosis not present

## 2023-06-19 DIAGNOSIS — N132 Hydronephrosis with renal and ureteral calculous obstruction: Secondary | ICD-10-CM | POA: Diagnosis not present

## 2023-06-19 DIAGNOSIS — R109 Unspecified abdominal pain: Secondary | ICD-10-CM | POA: Diagnosis not present

## 2023-06-19 MED ORDER — TRAMADOL HCL 50 MG PO TABS
50.0000 mg | ORAL_TABLET | Freq: Four times a day (QID) | ORAL | 0 refills | Status: DC | PRN
Start: 2023-06-19 — End: 2023-08-07

## 2023-06-19 MED ORDER — KETOROLAC TROMETHAMINE 60 MG/2ML IM SOLN
30.0000 mg | Freq: Once | INTRAMUSCULAR | Status: AC
  Administered 2023-06-19: 30 mg via INTRAMUSCULAR

## 2023-06-19 MED ORDER — SENNA-DOCUSATE SODIUM 8.6-50 MG PO TABS: 1.0000 | ORAL_TABLET | Freq: Every day | ORAL | 2 refills | Status: DC

## 2023-06-19 NOTE — Assessment & Plan Note (Signed)
 Differentials include nephrolithiasis/hydronephrosis, pyelonephritis, chronic constipation, appendicitis Check CT renal for now as she has h/o recurrent nephrolithiasis Toradol IM today

## 2023-06-19 NOTE — Assessment & Plan Note (Signed)
 Last BMP reviewed Maintain adequate hydration Followed by nephrology

## 2023-06-19 NOTE — Telephone Encounter (Signed)
 Patient was seen this morning by Dr Allena Katz. When the CT scan is schedule call her on this phone number [phone].

## 2023-06-19 NOTE — Patient Instructions (Signed)
 Please take at least 64 ounces of fluid in a day.  Please take Senokot as needed for constipation.

## 2023-06-19 NOTE — Progress Notes (Signed)
 Acute Office Visit  Subjective:    Patient ID: Cheryl Abbott, female    DOB: 06/27/1945, 78 y.o.   MRN: [id number]  Chief Complaint  Patient presents with   Flank Pain    Side pain radiating into stomach and back     HPI Patient is in today for complaint of right-sided flank pain, for the last 1 week, which is intermittent, colicky, sharp, radiating towards back and suprapubic area.  She has history of recurrent nephrolithiasis, requiring lithotripsy and ureteral stent placement in the past.  Denies dysuria or hematuria currently.  Denies fever or chills.  She has chronic nausea in the morning time, which could be related to Trulicity .  She also reports chronic constipation, and has to take Colace for it.  She does not have BM for 4 days sometimes.  Denies melena or hematochezia currently.  Past Medical History:  Diagnosis Date   Anxiety disorder    Arthritis    Chronic back pain    Chronic pain of both shoulders    per pt told due to cervical spine pinched nerve   CKD (chronic kidney disease), stage III [facility])    nephrologist--- dr rachele;   due to tubular necrosis   Claustrophobia    Depression    Edema of both lower extremities    GERD (gastroesophageal reflux disease)    Headache    Hemorrhoids    w/ intermittant bleeding   History of adenomatous polyp of colon    History of kidney stones    Hx-TIA (transient ischemic attack) 02/26/2016   per pt no residual   Hydronephrosis, left    followed by urologist--- dr matilda;   persistant   Hyperlipidemia    Hypertension    Iron  deficiency    followed by [facility] w/ hx iron  infusions   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   oncologist--- [facility];  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, active survillence   Lactose intolerance in adult 02/01/2016   Left ureteral calculus    Lumbar disc disease with radiculopathy    Nephrolithiasis    per CT 08-11-2021  left > right   Neuropathy,  peripheral    Nocturia more than twice per night    Normocytic anemia    OSA (obstructive sleep apnea)    per study 2007;   per pt no Bipap use since 2021   Renal atrophy, bilateral    Renal cyst, right    Sciatica    Sigmoid diverticulosis    Type 2 diabetes mellitus (HCC)     Past Surgical History:  Procedure Laterality Date   BIOPSY N/A 03/17/2013   Procedure: GASTRIC BIOPSIES;  Surgeon: Margo LITTIE Haddock, MD;  [facility];  Service: Endoscopy;  Laterality: N/A;   BIOPSY  06/10/2015   Procedure: BIOPSY;  Surgeon: Margo LITTIE Haddock, MD;  [facility];  Service: Endoscopy;;  gastric biopsy   CARDIAC CATHETERIZATION  05/11/2003   @ [facility] by dr t. burnard;  Abnormal cardiolite /   Normal coronary arteries and normal LVF,  ef  63%   CARDIOVASCULAR STRESS TEST  01/01/2014   normal lexiscan  cardiolite /  no ischemia/ infarct/  normal LV function and wall motion ,  ef 81%   COLONOSCOPY WITH PROPOFOL  N/A 02/26/2017   Procedure: COLONOSCOPY WITH PROPOFOL ;  Surgeon: Haddock Margo LITTIE, MD;  [facility];  Service: Endoscopy;  Laterality: N/A;  11:00am   COLONOSCOPY  WITH PROPOFOL  N/A 09/22/2019   Procedure: COLONOSCOPY WITH PROPOFOL ;  Surgeon: Harvey Margo CROME, MD;  [facility];  Service: Endoscopy;  Laterality: N/A;  1:15   CYSTO/   RIGHT URETEROSOCOPY LASER LITHOTRIPSY STONE EXTRACTION  10/13/2004   CYSTO/  RIGHT RETROGRADE PYELOGRAM/  PLACMENT RIGHT URETERAL STENT  01/10/2010   CYSTOSCOPY W/ URETERAL STENT PLACEMENT Right 08/19/2015   Procedure: CYSTOSCOPY WITH RETROGRADE PYELOGRAM/URETERAL STENT PLACEMENT;  Surgeon: Garnette Shack, MD;  [facility];  Service: Urology;  Laterality: Right;   CYSTOSCOPY W/ URETERAL STENT PLACEMENT Left 02/23/2020   Procedure: CYSTOSCOPY LEFT  RETROGRADE PYELOGRAM LEFT URETEROSCOPY URETERAL STENT PLACEMENT;  Surgeon: Shack Garnette, MD;  [facility];  Service: Urology;  Laterality: Left;    CYSTOSCOPY W/ URETERAL STENT PLACEMENT Right 09/24/2022   Procedure: CYSTOSCOPY WITH RETROGRADE PYELOGRAM/URETERAL STENT PLACEMENT;  Surgeon: Sherrilee Belvie CROME, MD;  [facility];  Service: Urology;  Laterality: Right;   CYSTOSCOPY W/ URETERAL STENT PLACEMENT Right 10/25/2022   Procedure: RIGHT URETERAL STENT EXCHANGE;  Surgeon: Sherrilee Belvie CROME, MD;  [facility];  Service: Urology;  Laterality: Right;   CYSTOSCOPY WITH RETROGRADE PYELOGRAM, URETEROSCOPY AND STENT PLACEMENT Left 10/21/2014   Procedure: CYSTOSCOPY WITH RETROGRADE PYELOGRAM, URETEROSCOPY AND STENT PLACEMENT;  Surgeon: Garnette Shack, MD;  Location: [facility];  Service: Urology;  Laterality: Left;   CYSTOSCOPY WITH RETROGRADE PYELOGRAM, URETEROSCOPY AND STENT PLACEMENT Right 11/22/2015   Procedure: CYSTOSCOPY, RIGHT URETERAL STENT REMOVAL; RIGHT RETROGRADE PYELOGRAM, RIGHT URETEROSCOPY WITH BALLOON DILATION; RIGHT URETERAL STENT PLACEMENT;  Surgeon: Garnette Shack, MD;  [facility];  Service: Urology;  Laterality: Right;   CYSTOSCOPY WITH RETROGRADE PYELOGRAM, URETEROSCOPY AND STENT PLACEMENT Left 10/18/2020   Procedure: CYSTOSCOPY WITH LEFT RETROGRADE PYELOGRAM, LEFT URETEROSCOPY  WITH LASER AND LEFT URETERAL STENT PLACEMENT;  Surgeon: Shack Garnette, MD;  [facility];  Service: Urology;  Laterality: Left;   CYSTOSCOPY WITH RETROGRADE PYELOGRAM, URETEROSCOPY AND STENT PLACEMENT Left 10/26/2021   Procedure: CYSTOSCOPY WITH RETROGRADE PYELOGRAM, URETEROSCOPY, STONE EXTRACTION  AND STENT PLACEMENT;  Surgeon: Shack Garnette, MD;  Location: [facility];  Service: Urology;  Laterality: Left;   CYSTOSCOPY WITH RETROGRADE PYELOGRAM, URETEROSCOPY AND STENT PLACEMENT Bilateral 10/25/2022   Procedure: CYSTOSCOPY WITH RETROGRADE PYELOGRAM, URETEROSCOPY;  Surgeon: Sherrilee Belvie CROME, MD;  [facility];  Service: Urology;  Laterality: Bilateral;    CYSTOSCOPY/RETROGRADE/URETEROSCOPY/STONE EXTRACTION WITH BASKET Bilateral 08/02/2015   Procedure: CYSTOSCOPY; BILATERAL RETROGRADE PYELOGRAMS; BILATERAL URETEROSCOPY, STONE EXTRACTION WITH BASKET;  Surgeon: Garnette Shack, MD;  [facility];  Service: Urology;  Laterality: Bilateral;   CYSTOSCOPY/RETROGRADE/URETEROSCOPY/STONE EXTRACTION WITH BASKET Right 06/28/2015   Procedure: CYSTOSCOPY/RETROGRADE/URETEROSCOPY/STONE EXTRACTION WITH BASKET, RIGHT URETERAL DOUBLE J STENT PLACEMENT;  Surgeon: Garnette Shack, MD;  [facility];  Service: Urology;  Laterality: Right;   ESOPHAGOGASTRODUODENOSCOPY (EGD) WITH PROPOFOL  N/A 03/17/2013   Procedure: ESOPHAGOGASTRODUODENOSCOPY (EGD) WITH PROPOFOL ;  Surgeon: Margo CROME Harvey, MD;  [facility];  Service: Endoscopy;  Laterality: N/A;   ESOPHAGOGASTRODUODENOSCOPY (EGD) WITH PROPOFOL  N/A 06/10/2015   Distal gastritis, distal esophageal stricture s/p dilation   ESOPHAGOGASTRODUODENOSCOPY (EGD) WITH PROPOFOL  N/A 02/26/2017   Procedure: ESOPHAGOGASTRODUODENOSCOPY (EGD) WITH PROPOFOL ;  Surgeon: Harvey Margo CROME, MD;  [facility];  Service: Endoscopy;  Laterality: N/A;   FLEXIBLE SIGMOIDOSCOPY  09/11/2011   DOQ:[id number] Internal hemorrhoids   HOLMIUM LASER APPLICATION Left 10/21/2014   Procedure: HOLMIUM LASER APPLICATION;  Surgeon: Garnette Shack, MD;  Location: [facility];  Service: Urology;  Laterality: Left;  HOLMIUM LASER APPLICATION Right 06/28/2015   Procedure: HOLMIUM LASER APPLICATION;  Surgeon: Garnette Shack, MD;  [facility];  Service: Urology;  Laterality: Right;   HOLMIUM LASER APPLICATION  02/23/2020   Procedure: HOLMIUM LASER APPLICATION;  Surgeon: Shack Garnette, MD;  [facility];  Service: Urology;;   HOLMIUM LASER APPLICATION Left 10/18/2020   Procedure: HOLMIUM LASER APPLICATION;  Surgeon: Shack Garnette, MD;  [facility];  Service: Urology;  Laterality: Left;   HOLMIUM  LASER APPLICATION Right 10/25/2022   Procedure: HOLMIUM LASER APPLICATION;  Surgeon: Sherrilee Belvie CROME, MD;  [facility];  Service: Urology;  Laterality: Right;   MASS EXCISION Left 03/04/2015   Procedure: EXCISION OF SOFT TISSUE NEOPLASM LEFT ARM;  Surgeon: Oneil Budge, MD;  Location: [facility];  Service: General;  Laterality: Left;   PERCUTANEOUS NEPHROSTOLITHOTOMY Bilateral 05/2010   @[facility]    POLYPECTOMY N/A 03/17/2013   Procedure: GASTRIC POLYPECTOMY;  Surgeon: Margo CROME Haddock, MD;  [facility];  Service: Endoscopy;  Laterality: N/A;   POLYPECTOMY  02/26/2017   Procedure: POLYPECTOMY;  Surgeon: Haddock Margo CROME, MD;  [facility];  Service: Endoscopy;;  polyp at cecum, ascending colon polyps x3, hepatic flexure polyps x8, transverse colon polyps x8    POLYPECTOMY  09/22/2019   Procedure: POLYPECTOMY;  Surgeon: Haddock Margo CROME, MD;  [facility];  Service: Endoscopy;;   REMOVAL RIGHT THIGH CYST  2006   SAVORY DILATION N/A 03/17/2013   Procedure: SAVORY DILATION;  Surgeon: Margo CROME Haddock, MD;  [facility];  Service: Endoscopy;  Laterality: N/A;  #12.8, 14, 15, 16 dilators used   SAVORY DILATION N/A 06/10/2015   Procedure: SAVORY DILATION;  Surgeon: Margo CROME Haddock, MD;  [facility];  Service: Endoscopy;  Laterality: N/A;   VAGINAL HYSTERECTOMY  1970's    Family History  Problem Relation Age of Onset   Hypertension Mother    Diabetes Mother    Heart failure Mother    Dementia Mother    Emphysema Father    Hypertension Father    Diabetes Brother    GER disease Brother    Hypertension Sister    Hypertension Sister    Cancer Other        family history    Diabetes Other        family history    Heart defect Other        famiily history    Arthritis Other        family history    Anesthesia problems Neg Hx    Hypotension Neg Hx    Malignant hyperthermia Neg Hx    Pseudochol deficiency Neg Hx    Colon cancer Neg Hx     Social  History   Socioeconomic History   Marital status: Divorced    Spouse name: Not on file   Number of children: 2   Years of education: 12   Highest education level: 12th grade  Occupational History   Occupation: retired   Tobacco Use   Smoking status: Former    Current packs/day: 0.00    Average packs/day: 1 pack/day for 20.0 years (20.0 ttl pk-yrs)    Types: Cigarettes    Start date: 09/21/1974    Quit date: 09/20/1988    Years since quitting: 34.7    Passive exposure: Past   Smokeless tobacco: Never  Vaping Use   Vaping status: Never Used  Substance and Sexual Activity   Alcohol use: No  Alcohol/week: 0.0 standard drinks of alcohol   Drug use: No   Sexual activity: Not Currently    Partners: Male    Birth control/protection: Surgical  Other Topics Concern   Not on file  Social History Narrative   Patient is right handed   Patient drinks some caffeine daily.   Social Drivers of Corporate Investment Banker Strain: Low Risk  (10/24/2022)   Overall Financial Resource Strain (CARDIA)    Difficulty of Paying Living Expenses: Not very hard  Food Insecurity: No Food Insecurity (10/24/2022)   Hunger Vital Sign    Worried About Running Out of Food in the Last Year: Never true    Ran Out of Food in the Last Year: Never true  Transportation Needs: No Transportation Needs (10/24/2022)   PRAPARE - Administrator, Civil Service (Medical): No    Lack of Transportation (Non-Medical): No  Physical Activity: Inactive (07/06/2022)   Exercise Vital Sign    Days of Exercise per Week: 0 days    Minutes of Exercise per Session: 0 min  Stress: No Stress Concern Present (07/06/2022)   [employer] of Occupational Health - Occupational Stress Questionnaire    Feeling of Stress : Not at all  Social Connections: Moderately Isolated (07/06/2022)   Social Connection and Isolation Panel [NHANES]    Frequency of Communication with Friends and Family: More than three times a week     Frequency of Social Gatherings with Friends and Family: Once a week    Attends Religious Services: More than 4 times per year    Active Member of [employer] or Organizations: No    Attends Banker Meetings: Never    Marital Status: Divorced  Catering Manager Violence: Not At Risk (10/17/2022)   Humiliation, Afraid, Rape, and Kick questionnaire    Fear of Current or Ex-Partner: No    Emotionally Abused: No    Physically Abused: No    Sexually Abused: No    Outpatient Medications Prior to Visit  Medication Sig Dispense Refill   acetaminophen  (TYLENOL ) 500 MG tablet Take 2 tablets (1,000 mg total) by mouth at bedtime.     amLODipine  (NORVASC ) 10 MG tablet TAKE 1 TABLET BY MOUTH EVERY DAY 90 tablet 1   aspirin  EC 81 MG tablet Take 81 mg by mouth daily. Swallow whole.     diclofenac  Sodium (VOLTAREN ) 1 % GEL APPLY 4 G TOPICALLY 4 (FOUR) TIMES DAILY AS NEEDED (PAIN). 400 g 5   diphenhydramine -acetaminophen  (TYLENOL  PM) 25-500 MG TABS tablet Take 2 tablets by mouth at bedtime.     Dulaglutide  (TRULICITY ) 0.75 MG/0.5ML SOPN INJECT 0.75 MG INTO THE SKIN WEEKLY 6 mL 1   ezetimibe  (ZETIA ) 10 MG tablet Take 1 tablet (10 mg total) by mouth daily. 90 tablet 1   folic acid  (FOLVITE ) 1 MG tablet Take 1 tablet (1 mg total) by mouth daily.     gabapentin  (NEURONTIN ) 300 MG capsule TAKE 1 CAPSULE BY MOUTH THREE TIMES A DAY AS NEEDED 90 capsule 5   glucose blood (ONETOUCH ULTRA) test strip USE AS DIRECTED 100 strip 5   Magnesium  400 MG CAPS Take 1 tablet by mouth daily. 30 capsule 11   nitroGLYCERIN  (NITROSTAT ) 0.4 MG SL tablet PLACE 1 TABLET (0.4 MG TOTAL) UNDER THE TONGUE EVERY 5 (FIVE) MINUTES AS NEEDED FOR CHEST PAIN. UP TO 3 TABLETS, THEN CALL 911. 25 tablet 3   OneTouch Delica Lancets 30G MISC Once daily testing dx e11.9 100 each 5  pantoprazole  (PROTONIX ) 40 MG tablet TAKE 1 TABLET BY MOUTH TWICE A DAY 180 tablet 1   PARoxetine  (PAXIL ) 40 MG tablet TAKE 1 TABLET (40 MG TOTAL) BY MOUTH IN THE  MORNING 90 tablet 1   rosuvastatin  (CRESTOR ) 20 MG tablet Take 20 mg by mouth daily.     SYMBICORT  80-4.5 MCG/ACT inhaler INHALE 2 PUFFS INTO THE LUNGS TWICE A DAY 30.6 each 4   TRADJENTA  5 MG TABS tablet TAKE 1 TABLET (5 MG TOTAL) BY MOUTH DAILY. 90 tablet 1   traMADol  (ULTRAM ) 50 MG tablet Take 1 tablet (50 mg total) by mouth every 6 (six) hours as needed. 60 tablet 0   UNABLE TO FIND Diabetic shoes x 1 pair, inserts x 3 pair  DX E11.9 1 each 0   vitamin B-12 (VITAMIN B12) 500 MCG tablet Take 1 tablet (500 mcg total) by mouth daily.     No facility-administered medications prior to visit.    Allergies  Allergen Reactions   Ace Inhibitors Cough    Patient doesn't recall  Other Reaction(s): GI Intolerance   Cephalexin Diarrhea and Nausea And Vomiting    Other Reaction(s): GI Intolerance   Nitrofurantoin  Diarrhea and Nausea And Vomiting   Penicillins Hives and Other (See Comments)    Review of Systems  Constitutional:  Negative for chills and fever.  HENT:  Negative for congestion, sinus pressure, sinus pain and sore throat.   Eyes:  Negative for pain and discharge.  Respiratory:  Negative for cough and shortness of breath.   Cardiovascular:  Negative for chest pain and palpitations.  Gastrointestinal:  Positive for constipation. Negative for diarrhea, nausea and vomiting.  Endocrine: Negative for polydipsia and polyuria.  Genitourinary:  Positive for difficulty urinating and flank pain. Negative for dysuria and hematuria.  Musculoskeletal:  Positive for arthralgias and back pain. Negative for neck pain and neck stiffness.  Skin:  Negative for rash.  Neurological:  Negative for dizziness and weakness.  Psychiatric/Behavioral:  Negative for agitation and behavioral problems.        Objective:    Physical Exam Vitals reviewed.  Constitutional:      General: She is not in acute distress.    Appearance: She is not diaphoretic.  HENT:     Head: Normocephalic and atraumatic.      Nose: Nose normal.     Mouth/Throat:     Mouth: Mucous membranes are moist.  Eyes:     General: No scleral icterus.    Extraocular Movements: Extraocular movements intact.  Cardiovascular:     Rate and Rhythm: Normal rate and regular rhythm.     Heart sounds: Normal heart sounds. No murmur heard. Pulmonary:     Breath sounds: Normal breath sounds. No wheezing or rales.  Abdominal:     Palpations: Abdomen is soft.     Tenderness: There is abdominal tenderness (RLQ and flank). There is no right CVA tenderness or left CVA tenderness.  Musculoskeletal:     Right lower leg: No edema.     Left lower leg: No edema.  Skin:    General: Skin is warm.     Findings: No rash.  Neurological:     General: No focal deficit present.     Mental Status: She is alert and oriented to person, place, and time.  Psychiatric:        Mood and Affect: Mood normal.        Behavior: Behavior normal.     BP 121/74 (BP Location: Right  Arm, Patient Position: Sitting, Cuff Size: Normal)   Pulse 97   Ht 5' 6 (1.676 m)   Wt 181 lb 6.4 oz (82.3 kg)   SpO2 91%   BMI 29.28 kg/m  Wt Readings from Last 3 Encounters:  06/19/23 181 lb 6.4 oz (82.3 kg)  04/25/23 182 lb 0.6 oz (82.6 kg)  02/25/23 185 lb 3.2 oz (84 kg)        Assessment & Plan:   Problem List Items Addressed This Visit       Digestive   Chronic constipation   Chronic constipation can also cause abdominal pain/discomfort Advised to take Senokot-S for constipation Maintain adequate hydration      Relevant Medications   sennosides-docusate sodium  (SENOKOT-S) 8.6-50 MG tablet     Genitourinary   Recurrent nephrolithiasis - Primary   Considering history of recurrent nephrolithiasis and recent hydronephrosis due to ureteral stone, will have to check CT renal to rule out nephrolithiasis Toradol  30 mg IM today Maintain at least 64 ounces of fluid intake in a day Followed by urology - will contact if CT renal shows nephrolithiasis to  be etiology for her current symptoms Check UA      Relevant Orders   UA/M w/rflx Culture, Routine   CKD (chronic kidney disease) stage 3, GFR 30-59 ml/min (HCC)   Last BMP reviewed Maintain adequate hydration Followed by nephrology        Other   Right lower quadrant abdominal pain   Differentials include nephrolithiasis/hydronephrosis, pyelonephritis, chronic constipation, appendicitis Check CT renal for now as she has h/o recurrent nephrolithiasis Toradol  IM today      Relevant Orders   CT RENAL STONE STUDY     Meds ordered this encounter  Medications   sennosides-docusate sodium  (SENOKOT-S) 8.6-50 MG tablet    Sig: Take 1 tablet by mouth daily.    Dispense:  30 tablet    Refill:  2   ketorolac  (TORADOL ) injection 30 mg     Suzzane MARLA Blanch, MD

## 2023-06-19 NOTE — Assessment & Plan Note (Addendum)
 Considering history of recurrent nephrolithiasis and recent hydronephrosis due to ureteral stone, will have to check CT renal to rule out nephrolithiasis Toradol  30 mg IM today Maintain at least 64 ounces of fluid intake in a day Followed by urology - will contact if CT renal shows nephrolithiasis to be etiology for her current symptoms Check UA

## 2023-06-19 NOTE — Assessment & Plan Note (Signed)
 Chronic constipation can also cause abdominal pain/discomfort Advised to take Senokot-S for constipation Maintain adequate hydration

## 2023-06-20 DIAGNOSIS — R0902 Hypoxemia: Secondary | ICD-10-CM | POA: Diagnosis not present

## 2023-06-24 ENCOUNTER — Other Ambulatory Visit: Payer: Self-pay | Admitting: Family Medicine

## 2023-06-24 ENCOUNTER — Other Ambulatory Visit: Payer: Self-pay | Admitting: Cardiology

## 2023-06-25 ENCOUNTER — Other Ambulatory Visit: Payer: Self-pay | Admitting: Family Medicine

## 2023-06-25 NOTE — Telephone Encounter (Signed)
 Copied from CRM [id number]. Topic: Clinical - Medication Refill >> Jun 25, 2023  4:38 PM Elle L wrote: Most Recent Primary Care Visit:  Provider: TOBIE SUZZANE POUR  Department: [facility]  Visit Type: ACUTE  Date: 06/19/2023  Medication: TRADJENTA  5 MG TABS tablet  Has the patient contacted their pharmacy? Yes  Is this the correct pharmacy for this prescription? Yes If no, delete pharmacy and type the correct one.  This is the patient's preferred pharmacy:  [id number] - [place], [place] - [address] AT [facility] [address] [place]  [address] Phone: [phone] Fax: [phone]   Has the prescription been filled recently? Yes  Is the patient out of the medication? Yes  Has the patient been seen for an appointment in the last year OR does the patient have an upcoming appointment? Yes  Can we respond through MyChart? No  Agent: Please be advised that Rx refills may take up to 3 business days. We ask that you follow-up with your pharmacy.

## 2023-06-26 MED ORDER — LINAGLIPTIN 5 MG PO TABS: 5.0000 mg | ORAL_TABLET | Freq: Every day | ORAL | 1 refills | Status: DC

## 2023-06-28 ENCOUNTER — Encounter (HOSPITAL_COMMUNITY): Payer: Self-pay

## 2023-06-28 ENCOUNTER — Ambulatory Visit (HOSPITAL_COMMUNITY)
Admission: RE | Admit: 2023-06-28 | Discharge: 2023-06-28 | Disposition: A | Discharge status: Home / Self Care | Payer: 59 | Source: Ambulatory Visit | Attending: Family Medicine | Admitting: Family Medicine

## 2023-06-28 DIAGNOSIS — Z1231 Encounter for screening mammogram for malignant neoplasm of breast: Secondary | ICD-10-CM | POA: Diagnosis not present

## 2023-07-01 ENCOUNTER — Other Ambulatory Visit (HOSPITAL_COMMUNITY): Payer: Self-pay | Admitting: Family Medicine

## 2023-07-01 DIAGNOSIS — R928 Other abnormal and inconclusive findings on diagnostic imaging of breast: Secondary | ICD-10-CM

## 2023-07-04 ENCOUNTER — Other Ambulatory Visit: Payer: Self-pay | Admitting: Family Medicine

## 2023-07-08 ENCOUNTER — Other Ambulatory Visit: Payer: Self-pay | Admitting: Family Medicine

## 2023-07-08 NOTE — Telephone Encounter (Signed)
Copied from CRM [id number]. Topic: Clinical - Medication Refill >> Jul 08, 2023 12:11 PM Ivette P wrote: Most Recent Primary Care Visit:  Provider: Anabel Halon  Department: [facility]  Visit Type: ACUTE  Date: 06/19/2023  Medication: linagliptin (TRADJENTA) 5 MG TABS tablet  Has the patient contacted their pharmacy? Yes (Agent: If no, request that the patient contact the pharmacy for the refill. If patient does not wish to contact the pharmacy document the reason why and proceed with request.) (Agent: If yes, when and what did the pharmacy advise?)  Is this the correct pharmacy for this prescription? Yes If no, delete pharmacy and type the correct one.  This is the patient's preferred pharmacy:  [id number] - [place], [place] - [address] AT [facility] [address] [place] [address] Phone: [phone] Fax: [phone]   Has the prescription been filled recently? No  Is the patient out of the medication? Yes  Has the patient been seen for an appointment in the last year OR does the patient have an upcoming appointment? Yes  Can we respond through MyChart? No  Agent: Please be advised that Rx refills may take up to 3 business days. We ask that you follow-up with your pharmacy.

## 2023-07-16 ENCOUNTER — Ambulatory Visit (INDEPENDENT_AMBULATORY_CARE_PROVIDER_SITE_OTHER): Payer: 59 | Admitting: Family Medicine

## 2023-07-16 ENCOUNTER — Encounter: Payer: Self-pay | Admitting: Family Medicine

## 2023-07-16 VITALS — BP 107/65 | HR 98 | Ht 66.0 in | Wt 180.0 lb

## 2023-07-16 DIAGNOSIS — Z0001 Encounter for general adult medical examination with abnormal findings: Secondary | ICD-10-CM

## 2023-07-16 DIAGNOSIS — E785 Hyperlipidemia, unspecified: Secondary | ICD-10-CM

## 2023-07-16 DIAGNOSIS — I10 Essential (primary) hypertension: Secondary | ICD-10-CM

## 2023-07-16 NOTE — Patient Instructions (Addendum)
 F/U end June call if you need me sooner  Fasting lipid, cmp and eGFR last week in February  You need to contact your Dentist   I recommend salt water  gargle for sore throat , no sign of throat infection  Happy birthday and many more  Thanks for choosing [facility], we consider it a privelige to serve you.

## 2023-07-20 ENCOUNTER — Other Ambulatory Visit: Payer: Self-pay | Admitting: Cardiology

## 2023-07-21 DIAGNOSIS — R0902 Hypoxemia: Secondary | ICD-10-CM | POA: Diagnosis not present

## 2023-07-23 NOTE — Assessment & Plan Note (Signed)
Annual exam as documented. . Immunization and cancer screening needs are specifically addressed at this visit.

## 2023-07-23 NOTE — Progress Notes (Signed)
    Cheryl Abbott     MRN: [id number]      DOB: [birth year]  Chief Complaint  Patient presents with   Annual Exam    CPE sore throat x 1 week    HPI: Patient is in for annual physical exam. C/o sore throat and has dental caries , will reach out to Dentiist. Immunization is reviewed , and  updated if needed.   PE: BP 107/65 (BP Location: Right Arm, Patient Position: Sitting, Cuff Size: Large)   Pulse 98   Ht 5' 6 (1.676 m)   Wt 180 lb (81.6 kg)   SpO2 90%   BMI 29.05 kg/m   Pleasant  female, alert and oriented x 3, in no cardio-pulmonary distress. Afebrile. HEENT No facial trauma or asymetry. Sinuses non tender. Dental caries right  Extra occullar muscles intact.. External ears normal, . Neck: supple, no adenopathy,JVD or thyromegaly.No bruits.  Chest: Clear to ascultation bilaterally.No crackles or wheezes. Non tender to palpation  Cardiovascular system; Heart sounds normal,  S1 and  S2 ,no S3.  No murmur, or thrill. Apical beat not displaced Peripheral pulses normal.  Abdomen: Soft, non tender, no organomegaly or masses. No bruits. Bowel sounds normal. No guarding, tenderness or rebound.    Musculoskeletal exam: Decreased though adequate ROM of spine, hips , shoulders and knees. No deformity ,swelling or crepitus noted. No muscle wasting or atrophy.   Neurologic: Cranial nerves 2 to 12 intact. Power, tone ,sensation  normal throughout. No disturbance in gait. No tremor.  Skin: Intact, no ulceration, erythema , scaling or rash noted. Pigmentation normal throughout  Psych; Normal mood and affect. Judgement and concentration normal   Assessment & Plan:  Encounter for Medicare annual examination with abnormal findings Annual exam as documented. . Immunization and cancer screening needs are specifically addressed at this visit.

## 2023-07-29 ENCOUNTER — Ambulatory Visit (HOSPITAL_COMMUNITY)
Admission: RE | Admit: 2023-07-29 | Discharge: 2023-07-29 | Disposition: A | Discharge status: Home / Self Care | Payer: 59 | Source: Ambulatory Visit | Attending: Urology | Admitting: Urology

## 2023-07-29 DIAGNOSIS — N200 Calculus of kidney: Secondary | ICD-10-CM | POA: Insufficient documentation

## 2023-07-29 DIAGNOSIS — N281 Cyst of kidney, acquired: Secondary | ICD-10-CM | POA: Diagnosis not present

## 2023-07-29 DIAGNOSIS — N132 Hydronephrosis with renal and ureteral calculous obstruction: Secondary | ICD-10-CM | POA: Diagnosis present

## 2023-07-29 DIAGNOSIS — N2889 Other specified disorders of kidney and ureter: Secondary | ICD-10-CM | POA: Diagnosis not present

## 2023-07-29 IMAGING — DX DG BONE SURVEY MET
9 of 10 series · 9 of 10 positions shown · non-contrast
Comparison: Myeloma survey 02/01/2020, 11/21/2018

CLINICAL DATA: Smoldering myeloma.

EXAM:
METASTATIC BONE SURVEY

[skull lat]
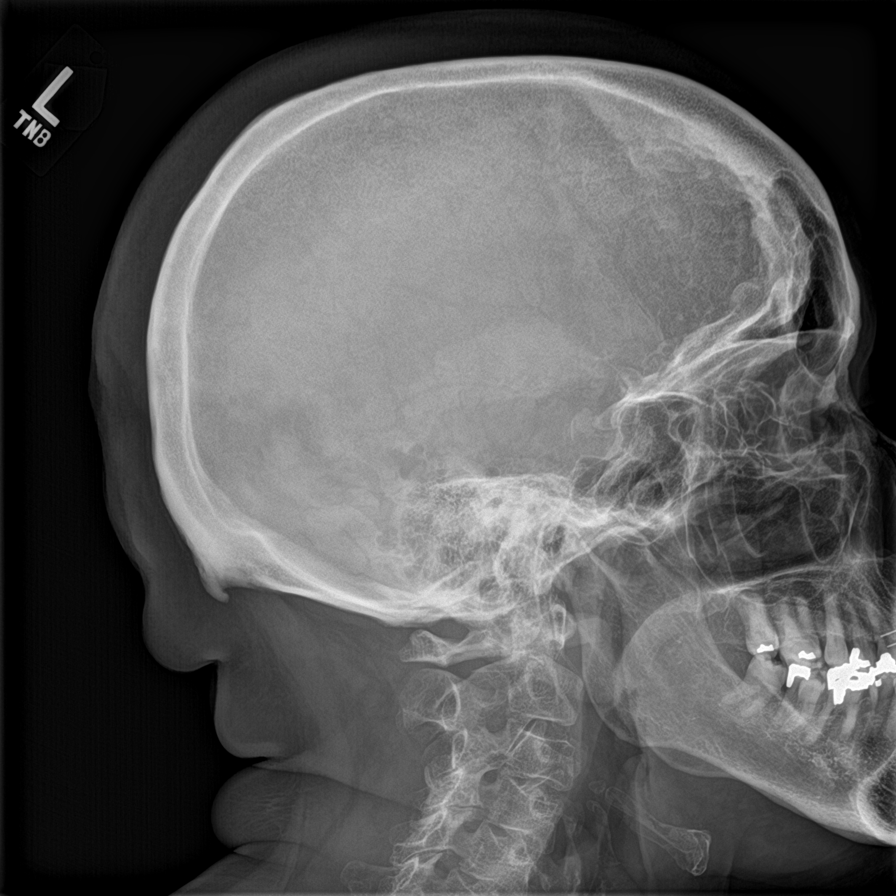

[shoulder ap (1 of 2)]
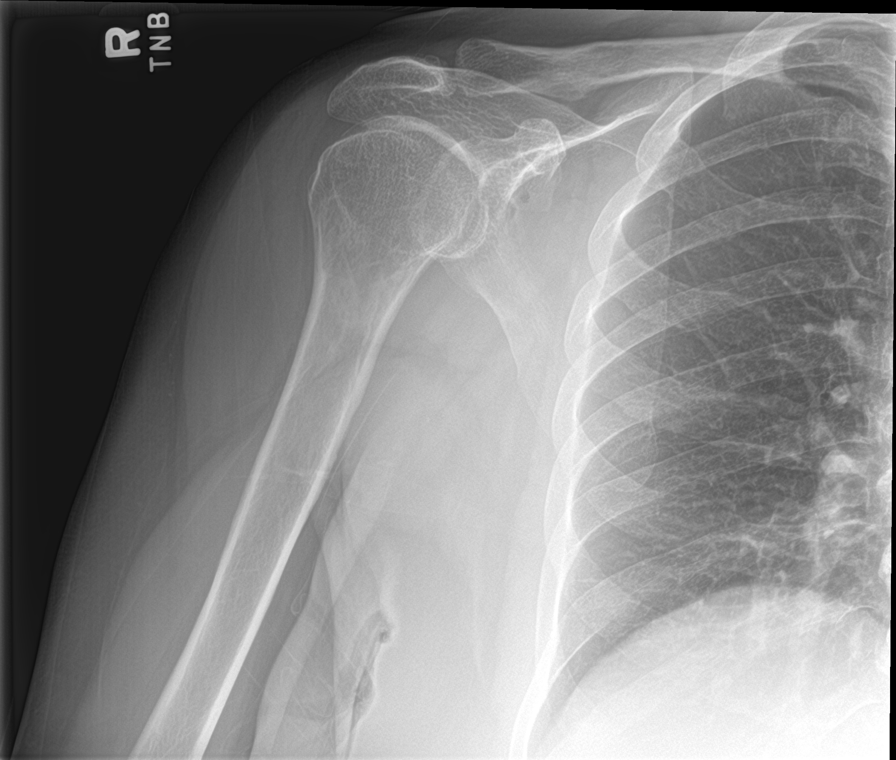

[shoulder ap (2 of 2)]
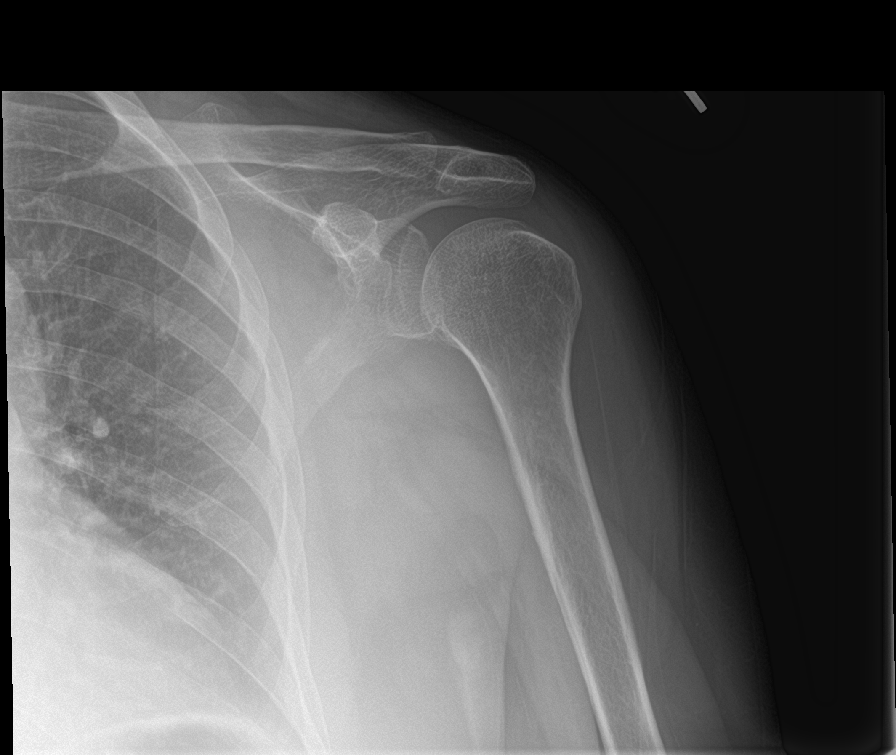

[humerus ap (1 of 2)]
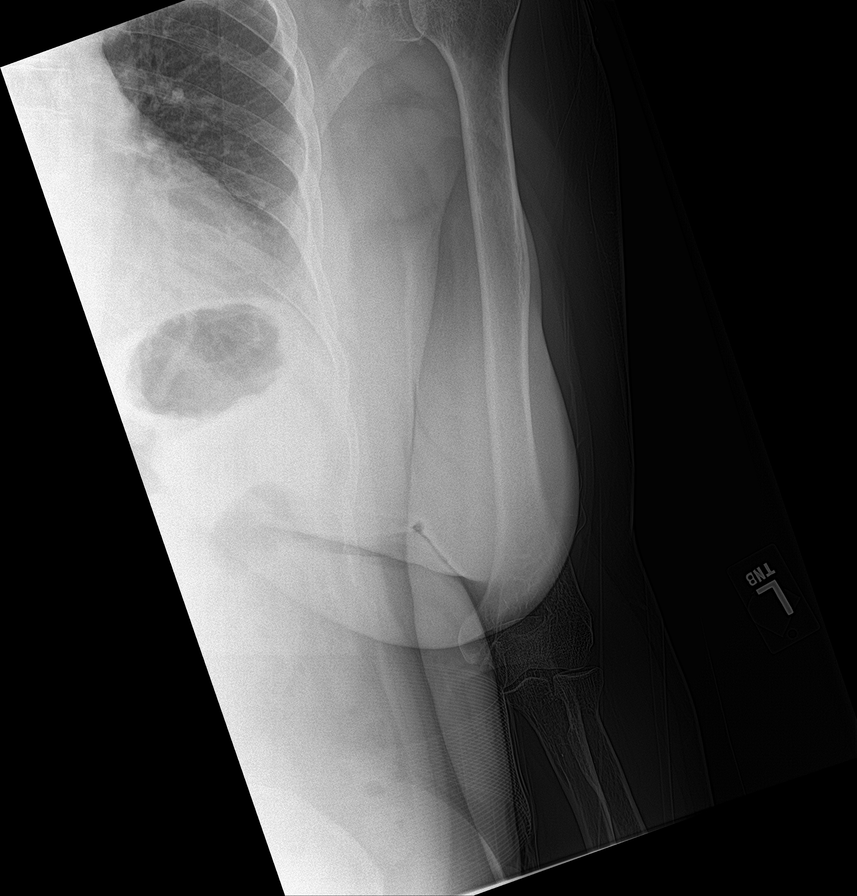

[humerus ap (2 of 2)]
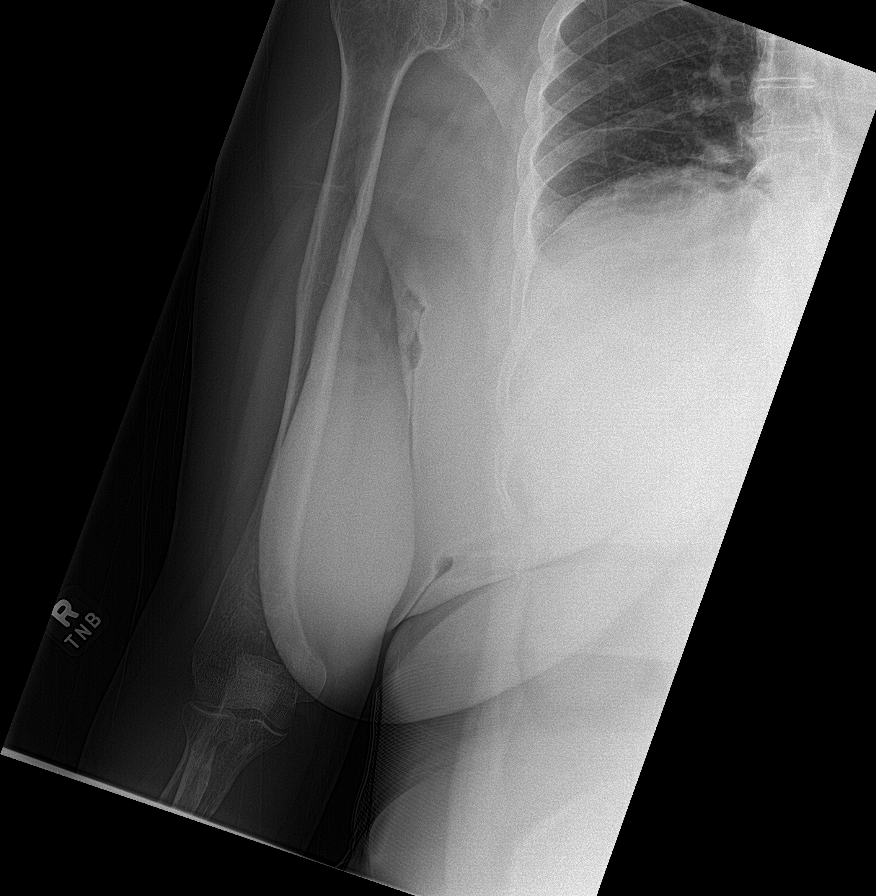

[forearm ap (1 of 2)]
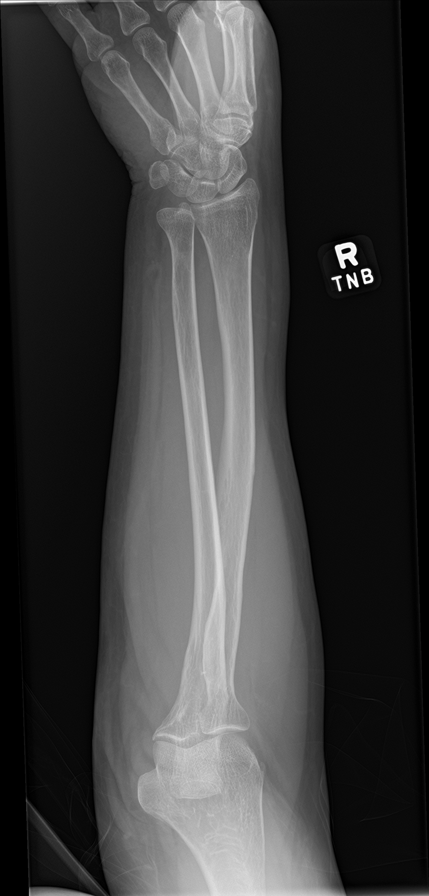

[forearm ap (2 of 2)]
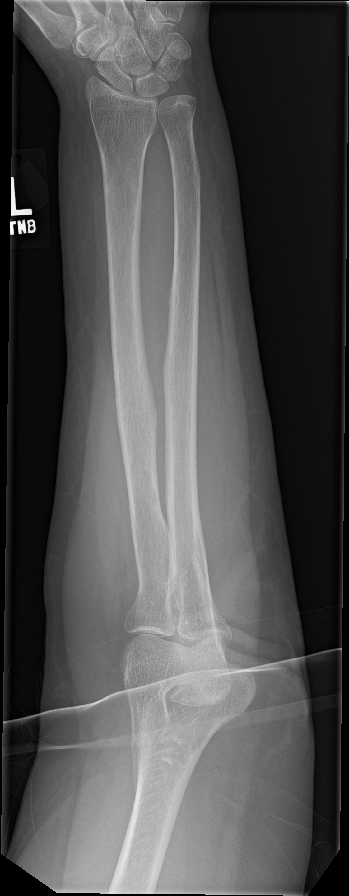

[c-spine ap]
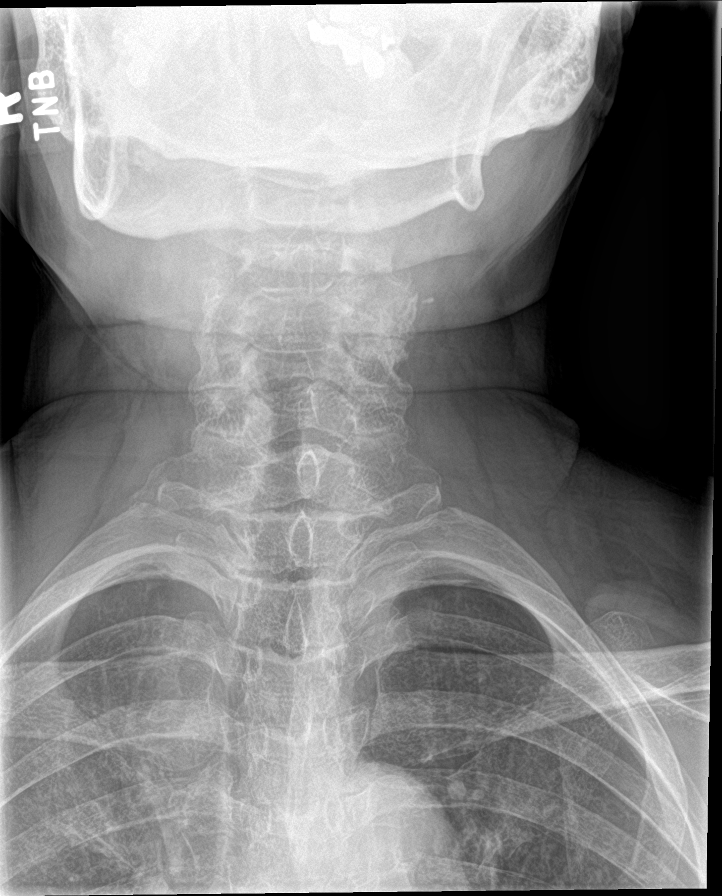

[c-spine lat]
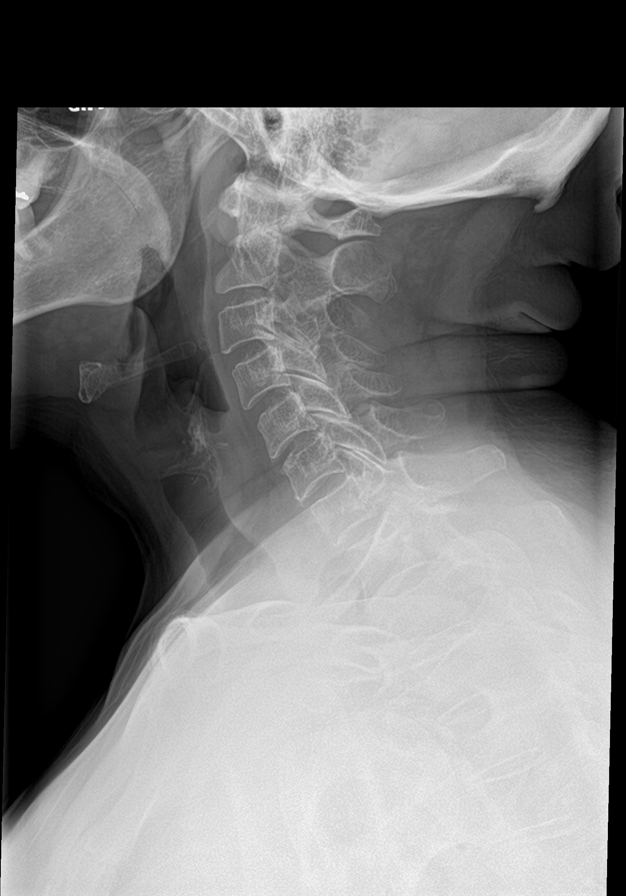

[9 of 10 positions shown; findings below may reference images not displayed]

FINDINGS: Two adjacent 4 mm lucencies are seen within the right scapula at the
base of the glenoid. These were not seen on prior exam. No other
focal bone lesion. Diffuse spondylosis of the cervical, thoracic,
and lumbar spine. Mild scoliosis. No evidence of vertebral body
compression or fracture. Previous left renal calculi is not seen on
the current exam. Mild degenerative change of both knees.
IMPRESSION: Two adjacent 4 mm lucencies within the right scapula at the base of
the glenoid, which were not seen on prior exam.

## 2023-07-29 IMAGING — MR MR SHOULDER*L* W/O CM
5 series · 40 of 40 positions shown · non-contrast
Comparison: None.

CLINICAL DATA: Left shoulder pain for 1 year.

EXAM:
MRI OF THE LEFT SHOULDER WITHOUT CONTRAST
TECHNIQUE: Multiplanar, multisequence MR imaging of the shoulder was performed.
No intravenous contrast was administered.

[Series 5: T2 fat-sat · axial · left · 4.0mm · 0.47mm/px · z∈[-81,+39]mm · 8 of 26 slices shown (1 of 3)]
[im 1/26]
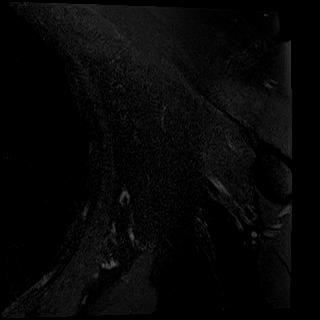
[im 4/26]
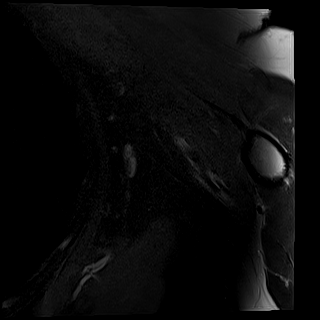
[im 8/26]
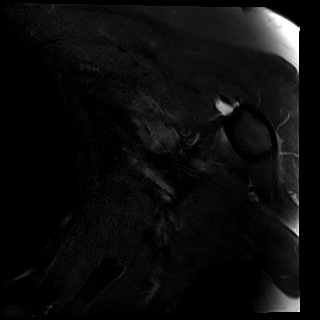
[im 11/26]
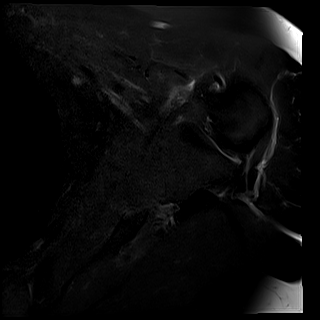
[im 15/26]
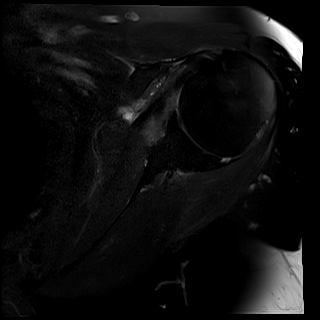
[im 18/26]
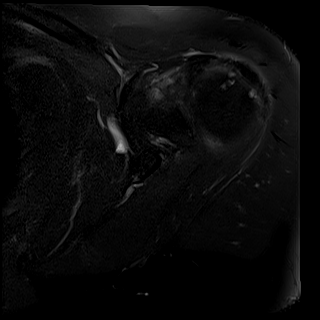
[im 22/26]
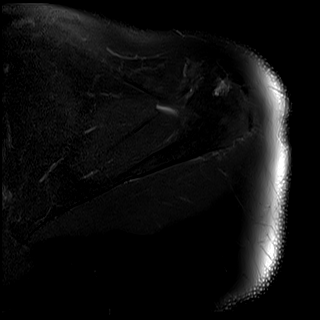
[im 26/26]
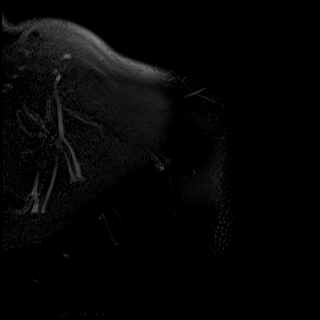

[Series 6: T2 fat-sat · coronal · left · 4.0mm · 0.47mm/px · 8 of 26 slices shown (2 of 3)]
[im 1/26]
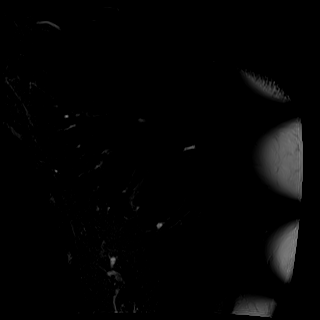
[im 4/26]
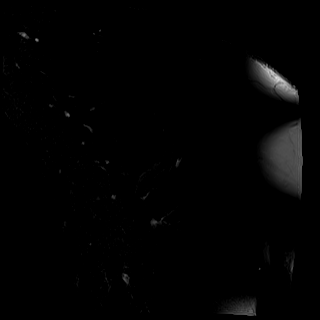
[im 8/26]
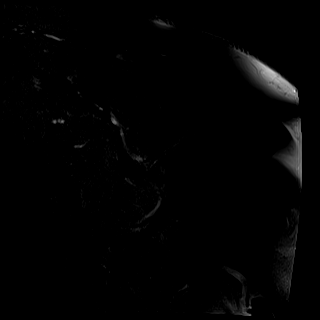
[im 11/26]
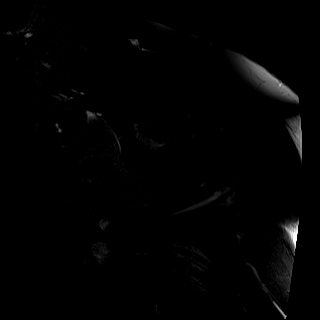
[im 15/26]
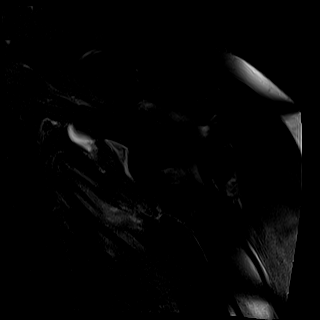
[im 18/26]
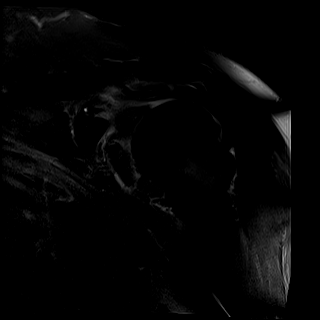
[im 22/26]
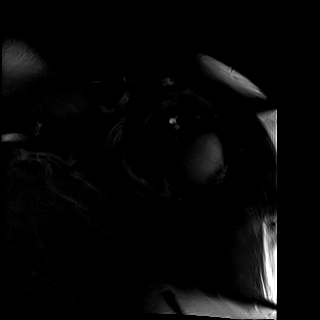
[im 26/26]
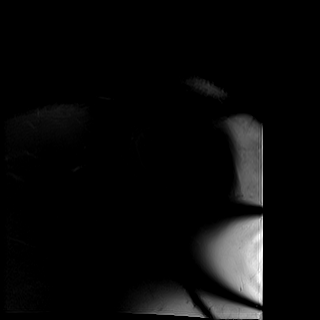

[Series 7: PD · coronal · left · 4.0mm · 0.47mm/px · 8 of 26 slices shown]
[im 1/26]
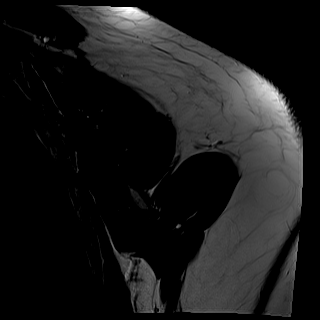
[im 4/26]
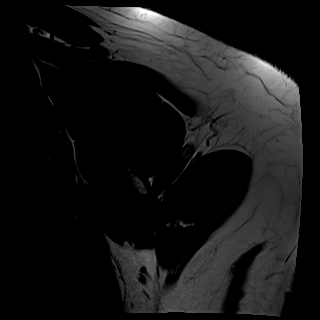
[im 8/26]
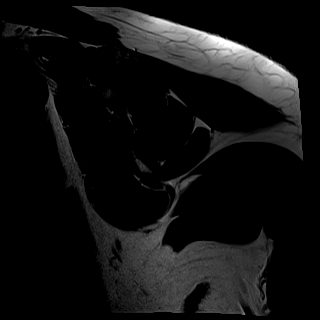
[im 11/26]
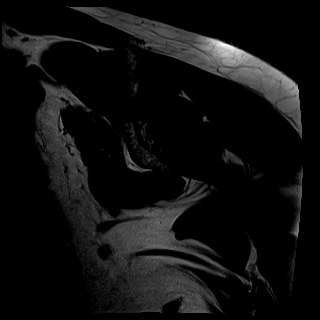
[im 15/26]
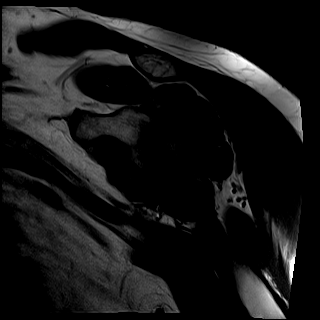
[im 18/26]
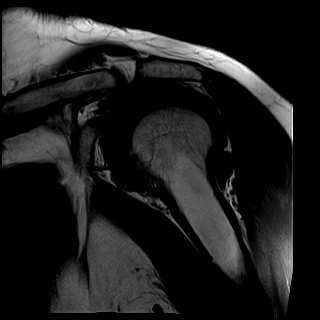
[im 22/26]
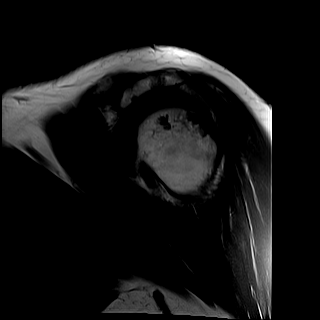
[im 26/26]
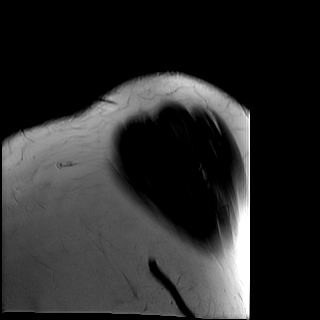

[Series 9: T1 · oblique · left · 4.0mm · 0.41mm/px · 8 of 25 slices shown]
[im 1/25]
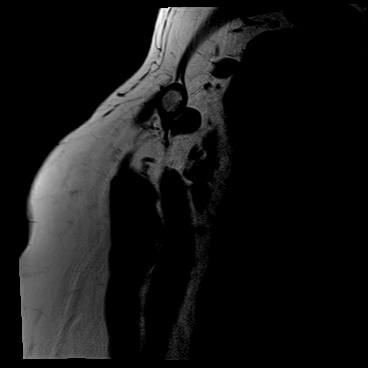
[im 4/25]
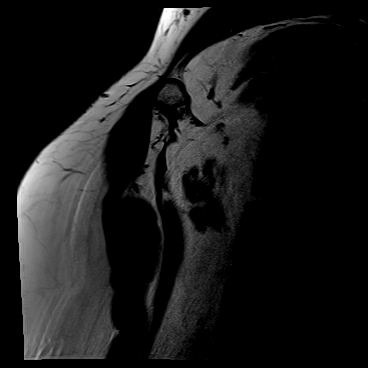
[im 7/25]
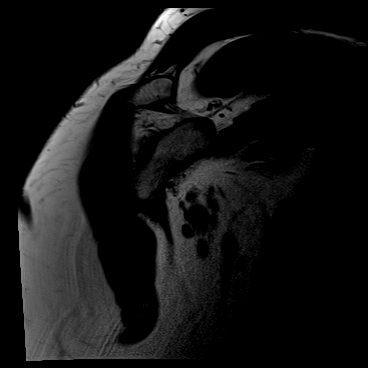
[im 11/25]
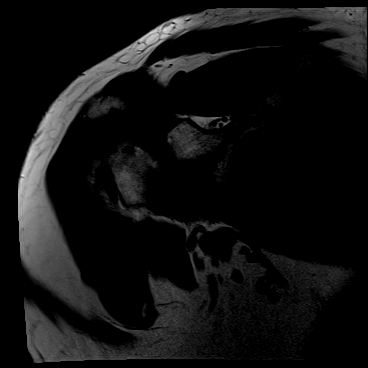
[im 14/25]
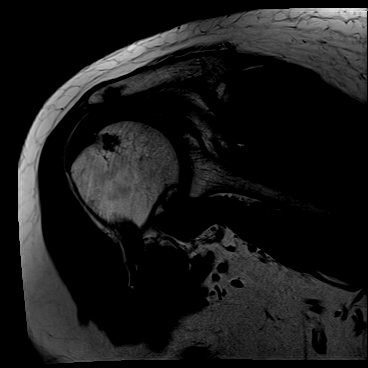
[im 18/25]
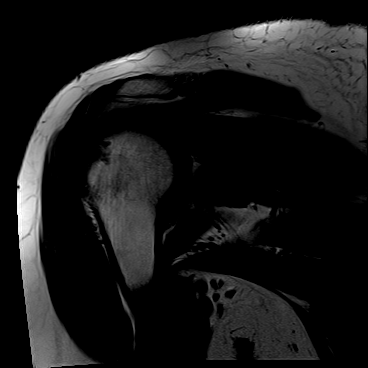
[im 21/25]
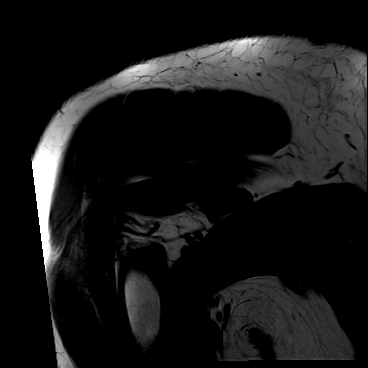
[im 25/25]
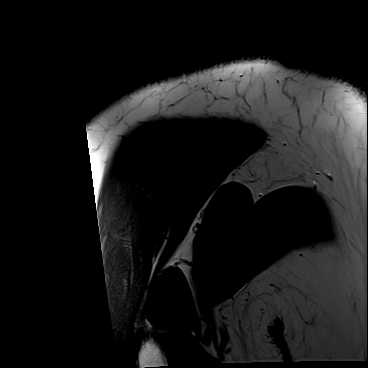

[Series 10: T2 fat-sat · sagittal · left · 4.0mm · 0.44mm/px · 8 of 25 slices shown (3 of 3)]
[im 1/25]
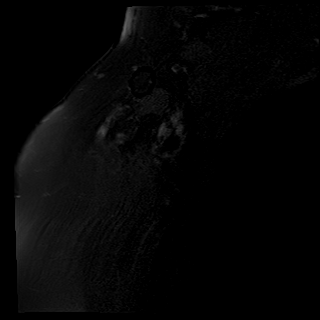
[im 4/25]
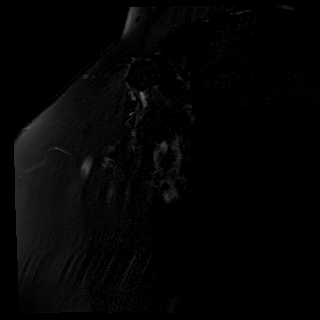
[im 7/25]
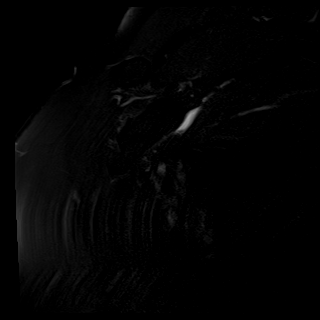
[im 11/25]
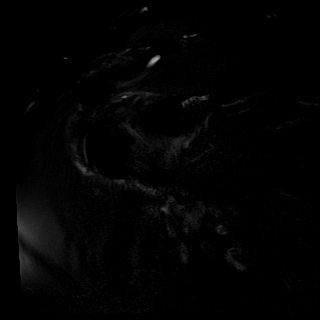
[im 14/25]
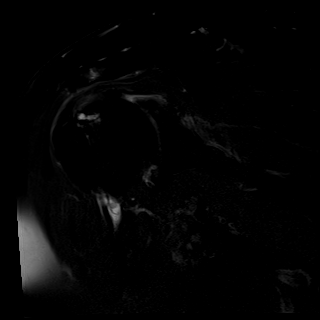
[im 18/25]
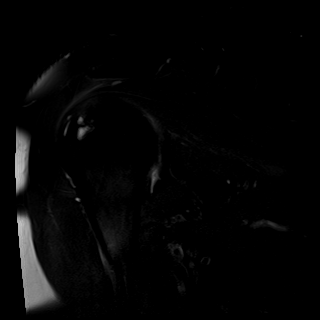
[im 21/25]
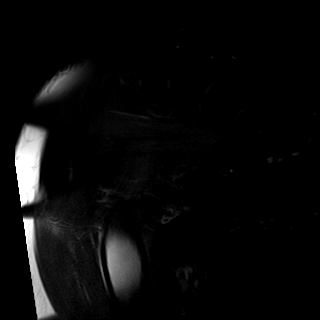
[im 25/25]
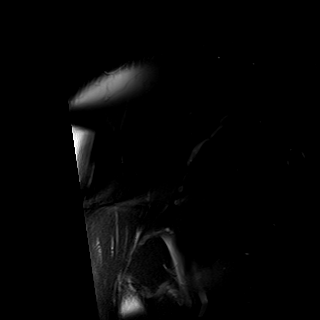

[40 of 40 positions shown; findings below may reference images not displayed]

FINDINGS: Rotator cuff: Moderate rotator cuff tendinopathy/tendinosis with
interstitial tears and areas of bursal and articular surface fraying
and fibrillation. No full-thickness retracted tear is identified.

Muscles:  No significant findings.

Biceps long head: Intact. Tendinopathy involving the intra-articular
portion.

Acromioclavicular Joint: Mild degenerative changes. Type 3 acromion.
Mild lateral downsloping but no subacromial spurring.

Glenohumeral Joint: Moderate degenerative changes with cartilage
thinning, joint space narrowing and early spurring. Small joint
effusion. Rotator interval synovitis noted.

Labrum:  Labral degenerative changes without obvious tear.

Bones:  No acute bony findings.

Other: Mild subacromial/subdeltoid bursitis.
IMPRESSION: 1. Moderate rotator cuff tendinopathy/tendinosis with interstitial
tears and areas of bursal and articular surface fraying and
fibrillation. No full-thickness retracted tear.
2. Intact long head biceps tendon and grossly normal glenoid labrum.
3. Moderate glenohumeral joint degenerative changes.
4. Mild subacromial/subdeltoid bursitis.

## 2023-08-02 ENCOUNTER — Other Ambulatory Visit: Payer: Self-pay | Admitting: Family Medicine

## 2023-08-02 MED ORDER — PAROXETINE HCL 40 MG PO TABS: 40.0000 mg | ORAL_TABLET | Freq: Every morning | ORAL | 1 refills | Status: DC

## 2023-08-02 NOTE — Telephone Encounter (Signed)
Pt called and reports that she needs this asap, says she has been out of this for 2 weeks when she initially requested this from the pharmacy.

## 2023-08-02 NOTE — Telephone Encounter (Signed)
Copied from CRM [id number]. Topic: Clinical - Medication Refill >> Aug 02, 2023  2:02 PM Thomes Dinning wrote: Most Recent Primary Care Visit:  Provider: Syliva Overman E  Department: [facility]  Visit Type: PHYSICAL  Date: 07/16/2023  Medication:  PARoxetine (PAXIL) 40 MG tablet  Has the patient contacted their pharmacy? Yes (Agent: If no, request that the patient contact the pharmacy for the refill. If patient does not wish to contact the pharmacy document the reason why and proceed with request.) (Agent: If yes, when and what did the pharmacy advise?)  Is this the correct pharmacy for this prescription? Yes If no, delete pharmacy and type the correct one.  This is the patient's preferred pharmacy:  [id number] - [place], [place] - [address] AT [facility] [address] [place] [address] Phone: [phone] Fax: [phone]   Has the prescription been filled recently? No  Is the patient out of the medication? Yes  Has the patient been seen for an appointment in the last year OR does the patient have an upcoming appointment? Yes  Can we respond through MyChart? No  Agent: Please be advised that Rx refills may take up to 3 business days. We ask that you follow-up with your pharmacy.

## 2023-08-03 IMAGING — US US RENAL
1 series · 14 of 25 positions shown · non-contrast
Comparison: Ultrasound 09/20/2020

CLINICAL DATA: Follow-up kidney stone

EXAM:
RENAL / URINARY TRACT ULTRASOUND COMPLETE

[Series 1: us renal · 14 of 45 slices shown]
[im 1/45]
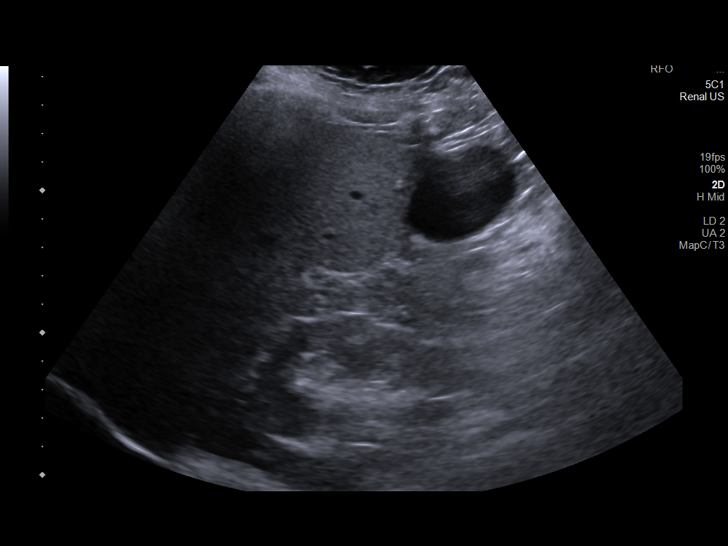
[im 4/45]
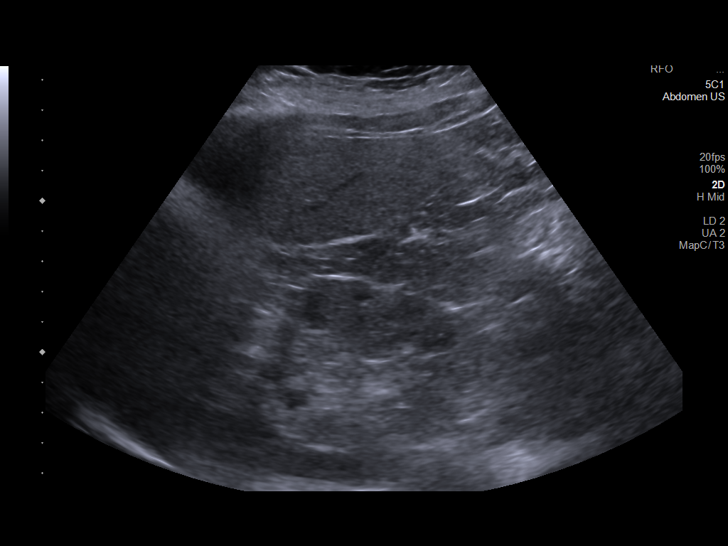
[im 8/45]
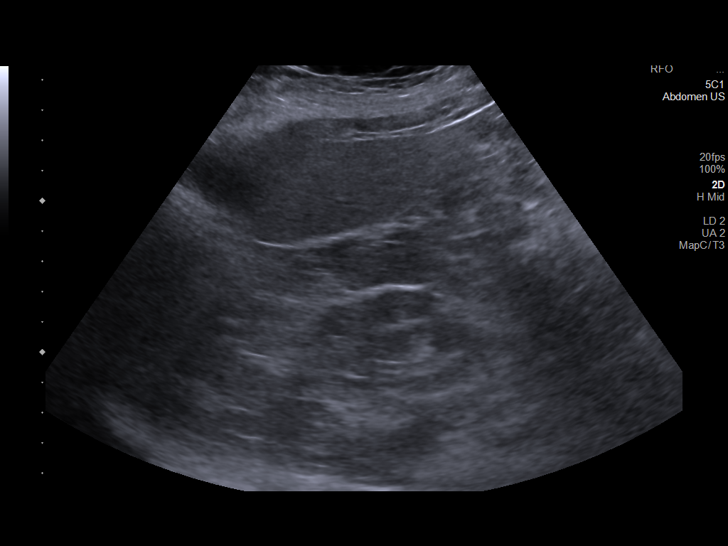
[im 12/45]
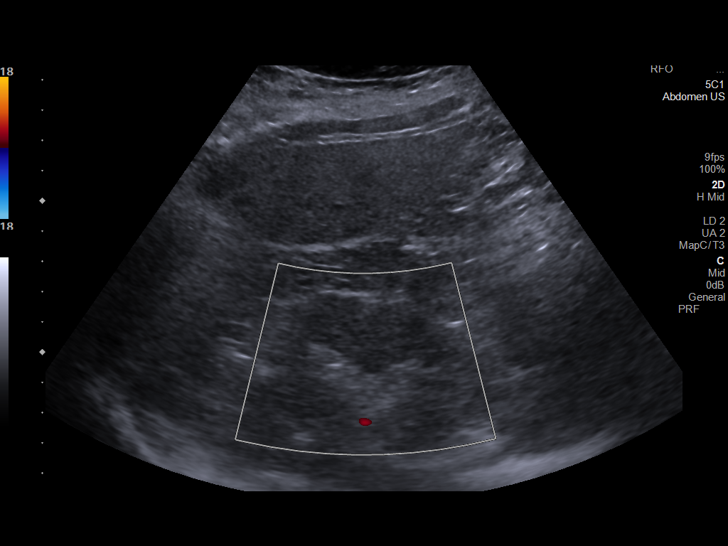
[im 15/45]
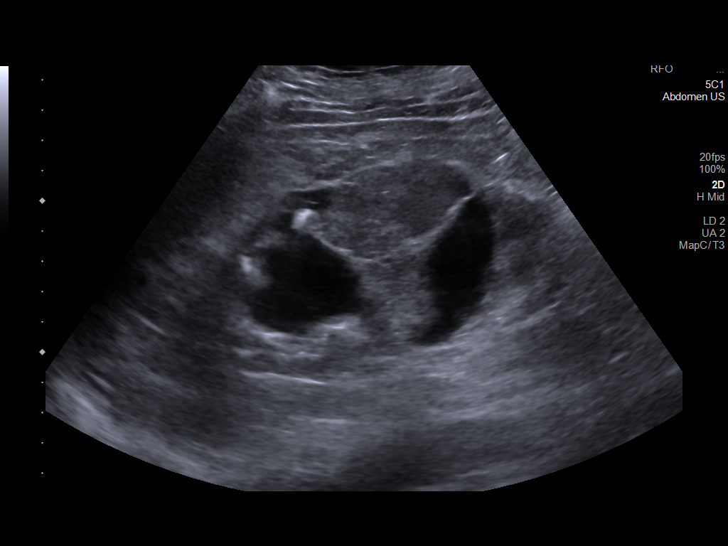
[im 17/45]
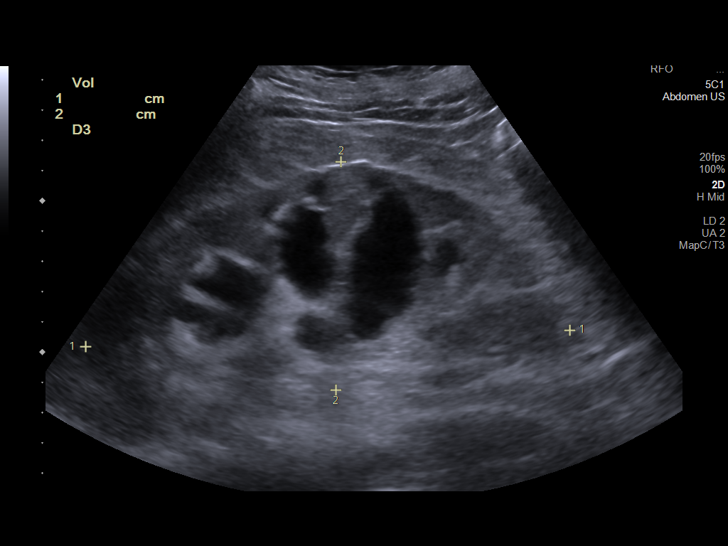
[im 21/45]
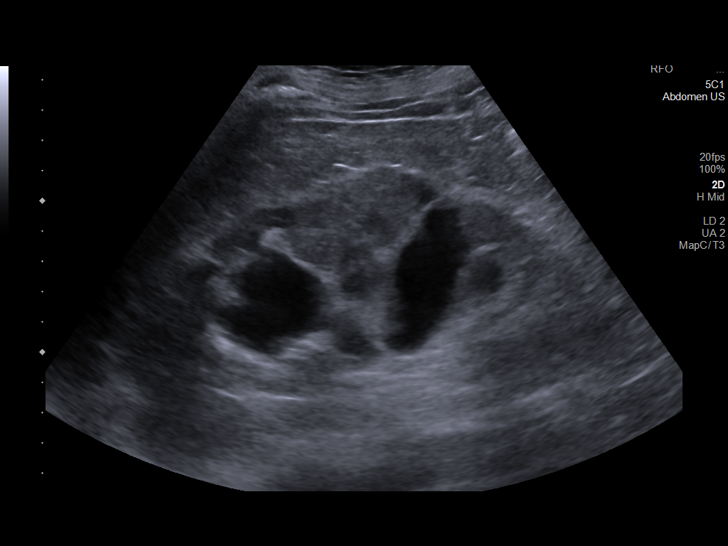
[im 24/45]
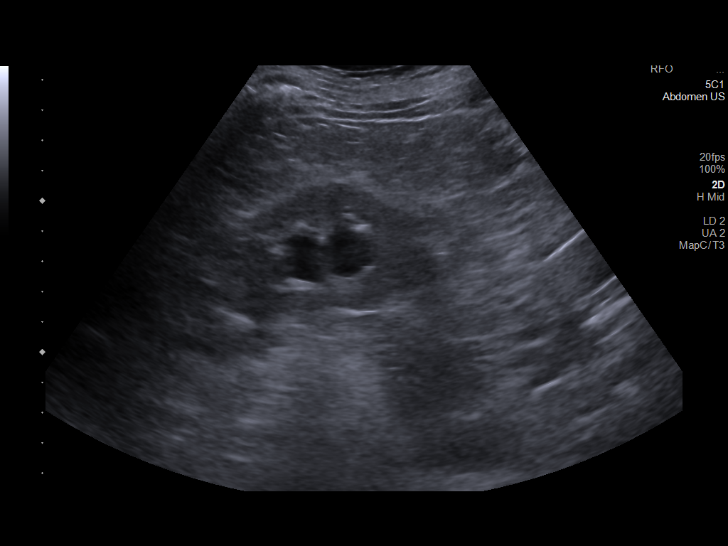
[im 28/45]
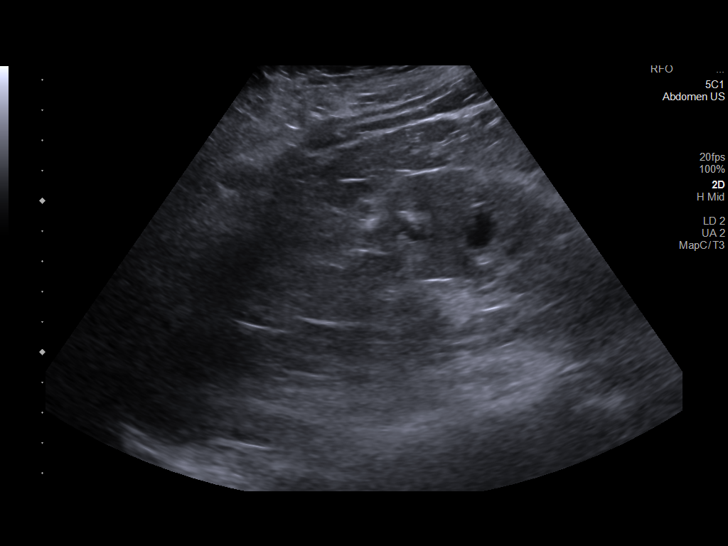
[im 30/45]
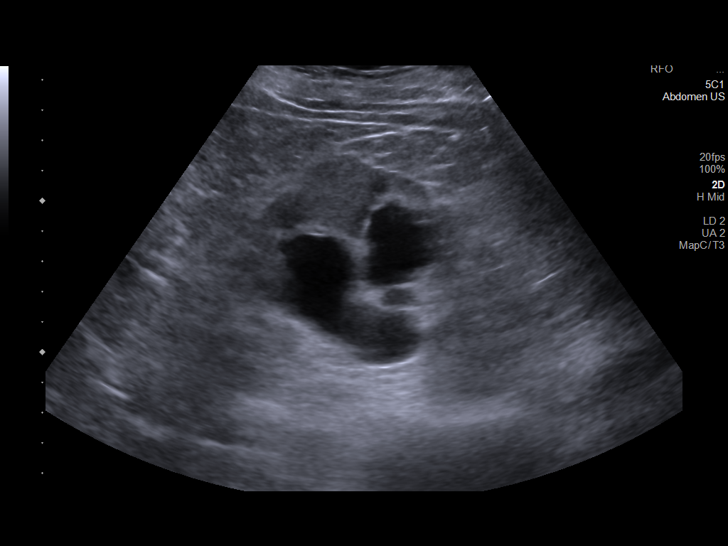
[im 34/45]
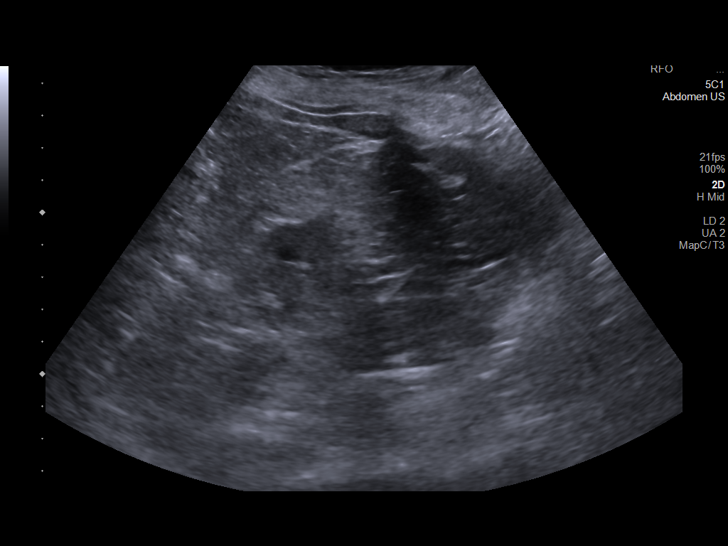
[im 37/45]
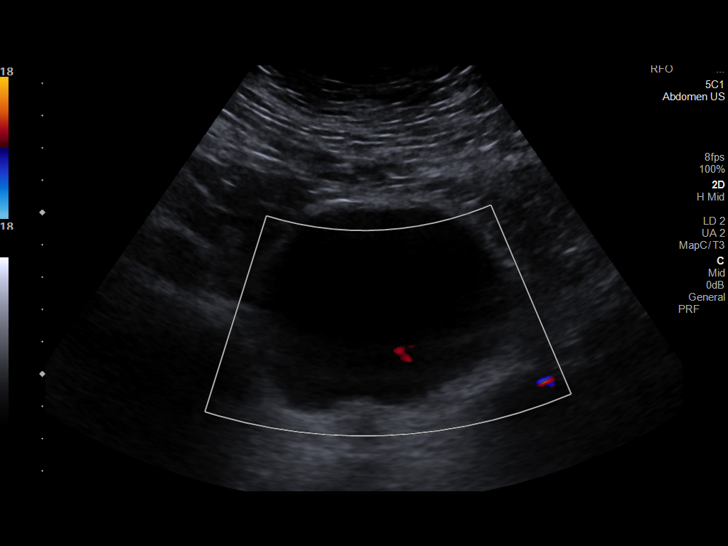
[im 41/45]
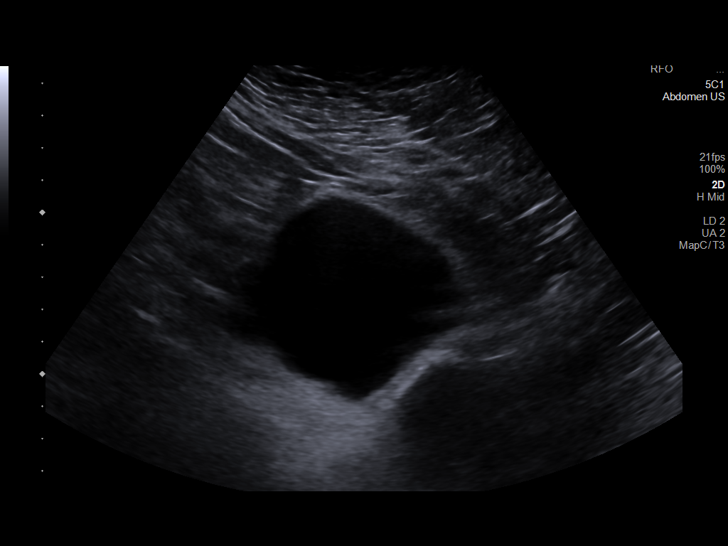
[im 45/45]
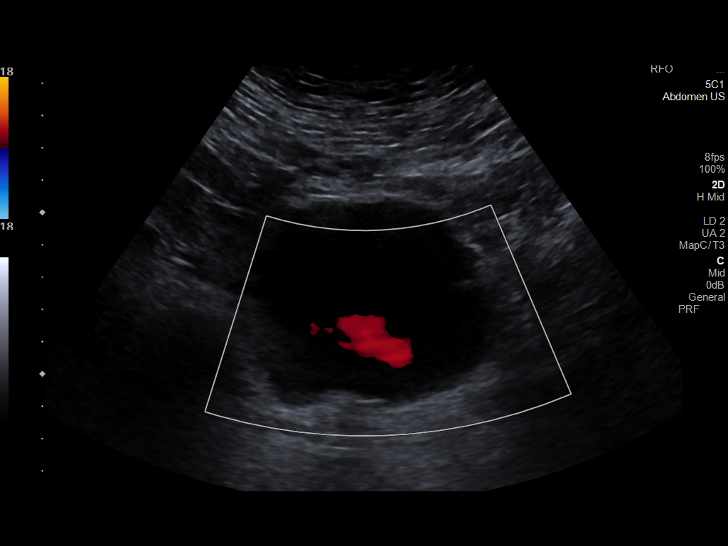

[14 of 25 positions shown; findings below may reference images not displayed]

FINDINGS: Right Kidney:

Renal measurements: 8.4 x 5.9 x 4.7 cm = volume: 121 mL. Cortex
appears slightly echogenic. No or hydronephrosis

Left Kidney:

Renal measurements: 16 x 7.5 x 7.4 cm = volume: 403 mL. Cortex
appears echogenic. Moderate hydronephrosis which is stable to mildly
decreased

Bladder:

Appears normal for degree of bladder distention.

Other:

None.
IMPRESSION: 1. Echogenic kidneys bilaterally.
2. Moderate left hydronephrosis, stable to minimal decreased.
3. No shadowing stones identified within either kidney on today's
study

## 2023-08-03 IMAGING — DX DG SCAPULA*R*
2 series · 2 of 2 positions shown · non-contrast
Comparison: 02/16/2021

CLINICAL DATA: Possible lytic lesions

EXAM:
RIGHT SCAPULA - 2+ VIEWS

[scapula ap]
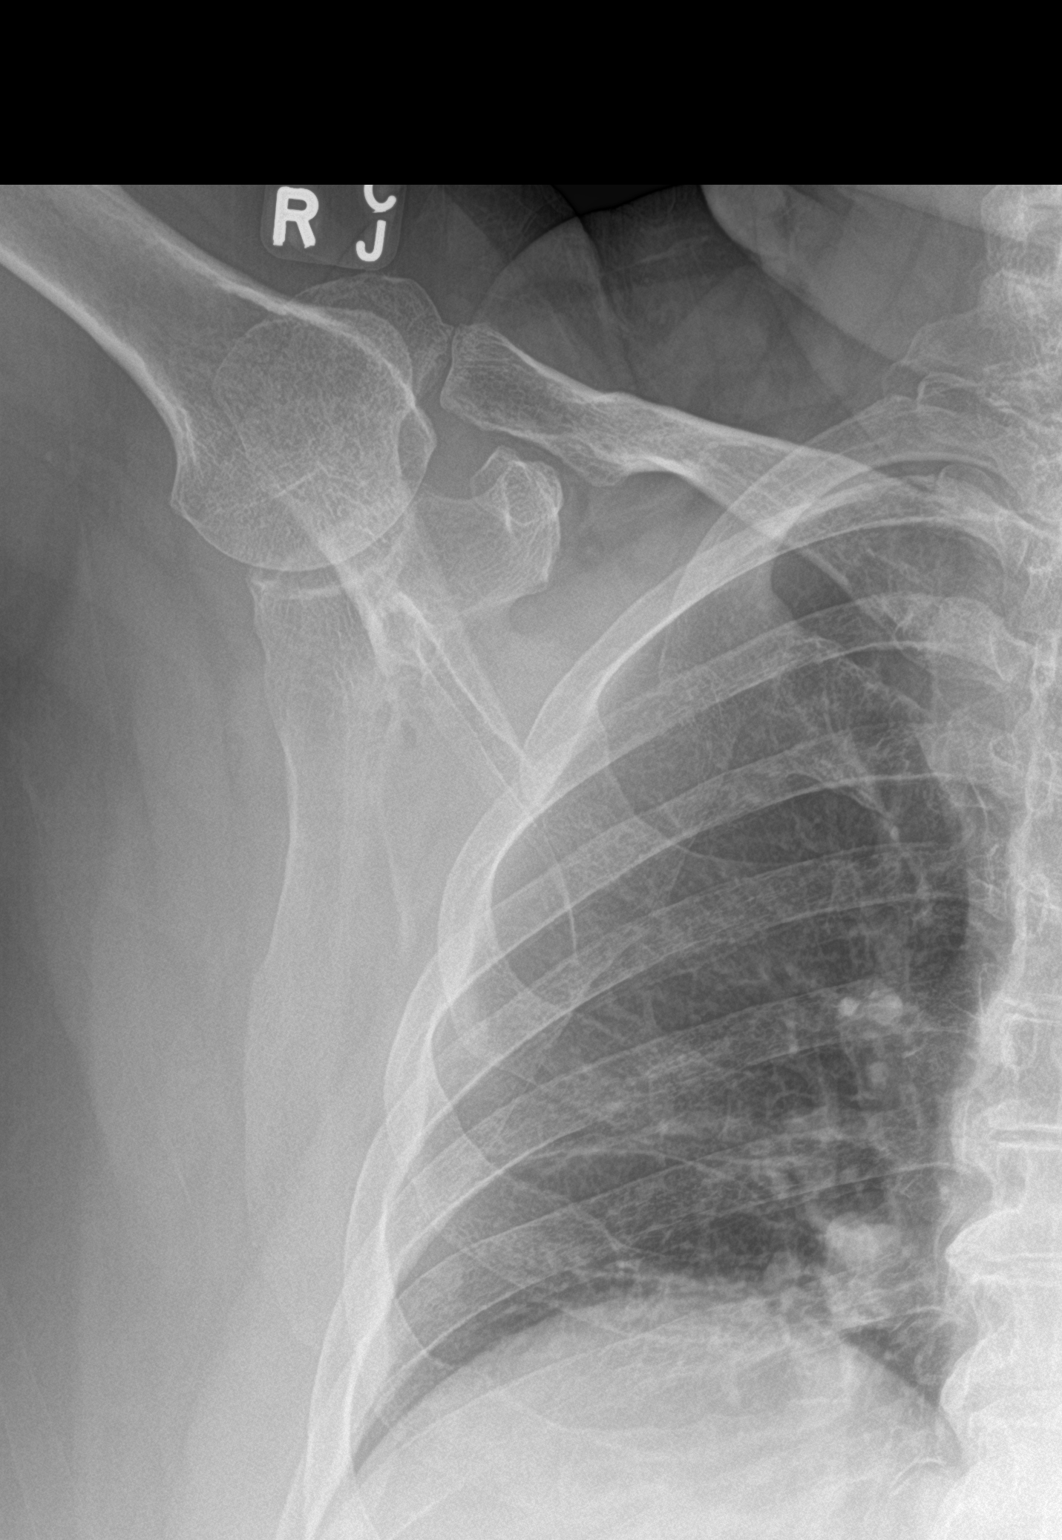

[scapula lat]
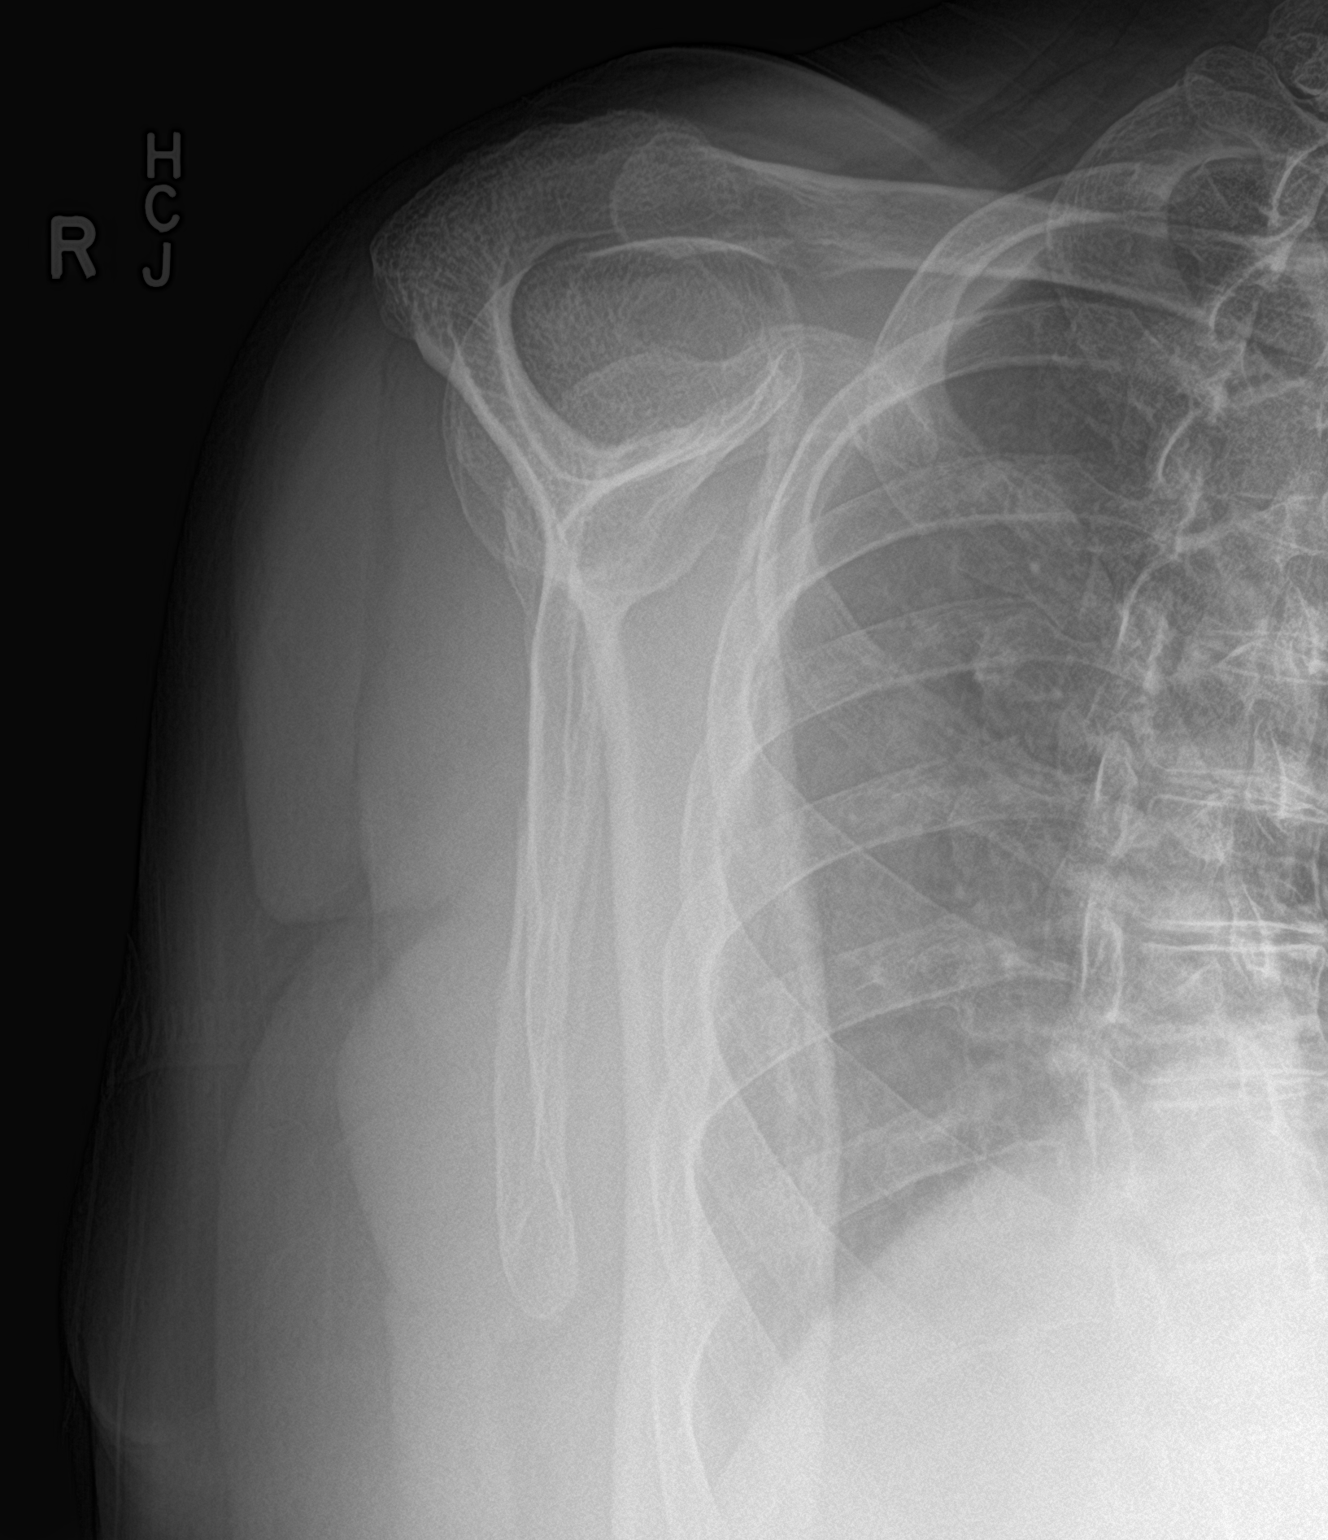

[2 of 2 positions shown; findings below may reference images not displayed]

FINDINGS: Mild AC joint degenerative change. No fracture or malalignment.
Small lucencies at the scapula are better seen on the skeletal
survey. The right lung is clear.
IMPRESSION: Small lucent lesions in the scapula at the base of the glenoid are
faintly visible but better seen on the skeletal survey.

## 2023-08-05 DIAGNOSIS — I10 Essential (primary) hypertension: Secondary | ICD-10-CM | POA: Diagnosis not present

## 2023-08-05 DIAGNOSIS — E785 Hyperlipidemia, unspecified: Secondary | ICD-10-CM | POA: Diagnosis not present

## 2023-08-06 ENCOUNTER — Ambulatory Visit (HOSPITAL_COMMUNITY)
Admission: RE | Admit: 2023-08-06 | Discharge: 2023-08-06 | Disposition: A | Discharge status: Home / Self Care | Payer: 59 | Source: Ambulatory Visit | Attending: Family Medicine | Admitting: Family Medicine

## 2023-08-06 ENCOUNTER — Encounter (HOSPITAL_COMMUNITY): Payer: Self-pay

## 2023-08-06 ENCOUNTER — Other Ambulatory Visit (HOSPITAL_COMMUNITY): Payer: Self-pay | Admitting: Family Medicine

## 2023-08-06 ENCOUNTER — Other Ambulatory Visit: Payer: Self-pay | Admitting: Orthopedic Surgery

## 2023-08-06 DIAGNOSIS — R928 Other abnormal and inconclusive findings on diagnostic imaging of breast: Secondary | ICD-10-CM | POA: Diagnosis not present

## 2023-08-06 DIAGNOSIS — N6315 Unspecified lump in the right breast, overlapping quadrants: Secondary | ICD-10-CM | POA: Diagnosis not present

## 2023-08-06 DIAGNOSIS — R92321 Mammographic fibroglandular density, right breast: Secondary | ICD-10-CM | POA: Diagnosis not present

## 2023-08-06 DIAGNOSIS — N6311 Unspecified lump in the right breast, upper outer quadrant: Secondary | ICD-10-CM | POA: Diagnosis not present

## 2023-08-06 DIAGNOSIS — M25512 Pain in left shoulder: Secondary | ICD-10-CM

## 2023-08-06 LAB — CMP14+EGFR
ALT: 11 [IU]/L (ref 0–32)
AST: 16 [IU]/L (ref 0–40)
Albumin: 4.3 g/dL (ref 3.8–4.8)
Alkaline Phosphatase: 93 [IU]/L (ref 44–121)
BUN/Creatinine Ratio: 10 — ABNORMAL LOW (ref 12–28)
BUN: 14 mg/dL (ref 8–27)
Bilirubin Total: 0.2 mg/dL (ref 0.0–1.2)
CO2: 22 mmol/L (ref 20–29)
Calcium: 10.3 mg/dL (ref 8.7–10.3)
Chloride: 104 mmol/L (ref 96–106)
Creatinine, Ser: 1.39 mg/dL — ABNORMAL HIGH (ref 0.57–1.00)
Globulin, Total: 3.7 g/dL (ref 1.5–4.5)
Glucose: 95 mg/dL (ref 70–99)
Potassium: 4 mmol/L (ref 3.5–5.2)
Sodium: 142 mmol/L (ref 134–144)
Total Protein: 8 g/dL (ref 6.0–8.5)
eGFR: 39 mL/min/{1.73_m2} — ABNORMAL LOW (ref >59)

## 2023-08-06 LAB — LIPID PANEL
Chol/HDL Ratio: 2.8 {ratio} (ref 0.0–4.4)
Cholesterol, Total: 105 mg/dL (ref 100–199)
HDL: 37 mg/dL — ABNORMAL LOW (ref >39)
LDL Chol Calc (NIH): 46 mg/dL (ref 0–99)
Triglycerides: 124 mg/dL (ref 0–149)
VLDL Cholesterol Cal: 22 mg/dL (ref 5–40)

## 2023-08-07 ENCOUNTER — Other Ambulatory Visit: Payer: Self-pay | Admitting: Orthopedic Surgery

## 2023-08-07 DIAGNOSIS — M25512 Pain in left shoulder: Secondary | ICD-10-CM

## 2023-08-07 MED ORDER — TRAMADOL HCL 50 MG PO TABS: 50.0000 mg | ORAL_TABLET | Freq: Four times a day (QID) | ORAL | 0 refills | Status: DC | PRN

## 2023-08-09 ENCOUNTER — Other Ambulatory Visit (HOSPITAL_COMMUNITY): Payer: Self-pay | Admitting: Family Medicine

## 2023-08-09 DIAGNOSIS — R928 Other abnormal and inconclusive findings on diagnostic imaging of breast: Secondary | ICD-10-CM

## 2023-08-12 ENCOUNTER — Ambulatory Visit: Payer: 59 | Admitting: Cardiology

## 2023-08-14 ENCOUNTER — Ambulatory Visit: Payer: 59 | Admitting: Urology

## 2023-08-14 VITALS — BP 116/73 | HR 91

## 2023-08-14 DIAGNOSIS — N200 Calculus of kidney: Secondary | ICD-10-CM | POA: Diagnosis not present

## 2023-08-14 DIAGNOSIS — R351 Nocturia: Secondary | ICD-10-CM

## 2023-08-14 MED ORDER — MIRABEGRON ER 25 MG PO TB24: 25.0000 mg | ORAL_TABLET | Freq: Every day | ORAL | Status: AC

## 2023-08-14 NOTE — Progress Notes (Signed)
 08/14/2023 1:27 PM   Cheryl Abbott Dec 22, 1945 [id number]  Referring provider: Kerri Perches, MD [address], [address] [place],  [place] [address]  Followup nephrolithiasis   HPI: Cheryl Abbott is a 78yo here for followup for nephrolithiasis and for evaluation of worsening nocturia. No stone events since last visit. CT from 06/19/23 shows bilateral 1mm renal calculi. She has chronic lower back pain. She has nocturia 3-4x. No issues with daytime.    PMH: Past Medical History:  Diagnosis Date   Anxiety disorder    Arthritis    Chronic back pain    Chronic pain of both shoulders    per pt told due to cervical spine pinched nerve   CKD (chronic kidney disease), stage III [facility])    nephrologist--- dr Wolfgang Phoenix;   due to tubular necrosis   Claustrophobia    Depression    Edema of both lower extremities    GERD (gastroesophageal reflux disease)    Headache    Hemorrhoids    w/ intermittant bleeding   History of adenomatous polyp of colon    History of kidney stones    Hx-TIA (transient ischemic attack) 02/26/2016   per pt no residual   Hydronephrosis, left    followed by urologist--- dr Retta Diones;   persistant   Hyperlipidemia    Hypertension    Iron deficiency    followed by [facility] w/ hx iron infusions   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   oncologist--- [facility];  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, active survillence   Lactose intolerance in adult 02/01/2016   Left ureteral calculus    Lumbar disc disease with radiculopathy    Nephrolithiasis    per CT 08-11-2021  left > right   Neuropathy, peripheral    Nocturia more than twice per night    Normocytic anemia    OSA (obstructive sleep apnea)    per study 2007;   per pt no Bipap use since 2021   Renal atrophy, bilateral    Renal cyst, right    Sciatica    Sigmoid diverticulosis    Type 2 diabetes mellitus (HCC)     Surgical History: Past Surgical History:  Procedure  Laterality Date   BIOPSY N/A 03/17/2013   Procedure: GASTRIC BIOPSIES;  Surgeon: West Bali, MD;  Location: [facility];  Service: Endoscopy;  Laterality: N/A;   BIOPSY  06/10/2015   Procedure: BIOPSY;  Surgeon: West Bali, MD;  [facility];  Service: Endoscopy;;  gastric biopsy   CARDIAC CATHETERIZATION  05/11/2003   @ [facility] by dr t. Tresa Endo;  Abnormal cardiolite/   Normal coronary arteries and normal LVF,  ef  63%   CARDIOVASCULAR STRESS TEST  01/01/2014   normal lexiscan cardiolite/  no ischemia/ infarct/  normal LV function and wall motion ,  ef 81%   COLONOSCOPY WITH PROPOFOL N/A 02/26/2017   Procedure: COLONOSCOPY WITH PROPOFOL;  Surgeon: West Bali, MD;  [facility];  Service: Endoscopy;  Laterality: N/A;  11:00am   COLONOSCOPY WITH PROPOFOL N/A 09/22/2019   Procedure: COLONOSCOPY WITH PROPOFOL;  Surgeon: West Bali, MD;  [facility];  Service: Endoscopy;  Laterality: N/A;  1:15   CYSTO/   RIGHT URETEROSOCOPY LASER LITHOTRIPSY STONE EXTRACTION  10/13/2004   CYSTO/  RIGHT RETROGRADE PYELOGRAM/  PLACMENT RIGHT URETERAL STENT  01/10/2010   CYSTOSCOPY W/ URETERAL STENT PLACEMENT Right 08/19/2015  Procedure: CYSTOSCOPY WITH RETROGRADE PYELOGRAM/URETERAL STENT PLACEMENT;  Surgeon: Marcine Matar, MD;  [facility];  Service: Urology;  Laterality: Right;   CYSTOSCOPY W/ URETERAL STENT PLACEMENT Left 02/23/2020   Procedure: CYSTOSCOPY LEFT  RETROGRADE PYELOGRAM LEFT URETEROSCOPY URETERAL STENT PLACEMENT;  Surgeon: Marcine Matar, MD;  [facility]: [facility];  Service: Urology;  Laterality: Left;   CYSTOSCOPY W/ URETERAL STENT PLACEMENT Right 09/24/2022   Procedure: CYSTOSCOPY WITH RETROGRADE PYELOGRAM/URETERAL STENT PLACEMENT;  Surgeon: Malen Gauze, MD;  [facility];  Service: Urology;  Laterality: Right;   CYSTOSCOPY W/ URETERAL STENT PLACEMENT Right 10/25/2022   Procedure: RIGHT URETERAL STENT  EXCHANGE;  Surgeon: Malen Gauze, MD;  [facility];  Service: Urology;  Laterality: Right;   CYSTOSCOPY WITH RETROGRADE PYELOGRAM, URETEROSCOPY AND STENT PLACEMENT Left 10/21/2014   Procedure: CYSTOSCOPY WITH RETROGRADE PYELOGRAM, URETEROSCOPY AND STENT PLACEMENT;  Surgeon: Marcine Matar, MD;  Location: [facility];  Service: Urology;  Laterality: Left;   CYSTOSCOPY WITH RETROGRADE PYELOGRAM, URETEROSCOPY AND STENT PLACEMENT Right 11/22/2015   Procedure: CYSTOSCOPY, RIGHT URETERAL STENT REMOVAL; RIGHT RETROGRADE PYELOGRAM, RIGHT URETEROSCOPY WITH BALLOON DILATION; RIGHT URETERAL STENT PLACEMENT;  Surgeon: Marcine Matar, MD;  [facility];  Service: Urology;  Laterality: Right;   CYSTOSCOPY WITH RETROGRADE PYELOGRAM, URETEROSCOPY AND STENT PLACEMENT Left 10/18/2020   Procedure: CYSTOSCOPY WITH LEFT RETROGRADE PYELOGRAM, LEFT URETEROSCOPY  WITH LASER AND LEFT URETERAL STENT PLACEMENT;  Surgeon: Marcine Matar, MD;  [facility];  Service: Urology;  Laterality: Left;   CYSTOSCOPY WITH RETROGRADE PYELOGRAM, URETEROSCOPY AND STENT PLACEMENT Left 10/26/2021   Procedure: CYSTOSCOPY WITH RETROGRADE PYELOGRAM, URETEROSCOPY, STONE EXTRACTION  AND STENT PLACEMENT;  Surgeon: Marcine Matar, MD;  Location: [facility];  Service: Urology;  Laterality: Left;   CYSTOSCOPY WITH RETROGRADE PYELOGRAM, URETEROSCOPY AND STENT PLACEMENT Bilateral 10/25/2022   Procedure: CYSTOSCOPY WITH RETROGRADE PYELOGRAM, URETEROSCOPY;  Surgeon: Malen Gauze, MD;  [facility];  Service: Urology;  Laterality: Bilateral;   CYSTOSCOPY/RETROGRADE/URETEROSCOPY/STONE EXTRACTION WITH BASKET Bilateral 08/02/2015   Procedure: CYSTOSCOPY; BILATERAL RETROGRADE PYELOGRAMS; BILATERAL URETEROSCOPY, STONE EXTRACTION WITH BASKET;  Surgeon: Marcine Matar, MD;  [facility];  Service: Urology;  Laterality: Bilateral;   CYSTOSCOPY/RETROGRADE/URETEROSCOPY/STONE EXTRACTION WITH  BASKET Right 06/28/2015   Procedure: CYSTOSCOPY/RETROGRADE/URETEROSCOPY/STONE EXTRACTION WITH BASKET, RIGHT URETERAL DOUBLE J STENT PLACEMENT;  Surgeon: Marcine Matar, MD;  [facility];  Service: Urology;  Laterality: Right;   ESOPHAGOGASTRODUODENOSCOPY (EGD) WITH PROPOFOL N/A 03/17/2013   Procedure: ESOPHAGOGASTRODUODENOSCOPY (EGD) WITH PROPOFOL;  Surgeon: West Bali, MD;  [facility]: [facility];  Service: Endoscopy;  Laterality: N/A;   ESOPHAGOGASTRODUODENOSCOPY (EGD) WITH PROPOFOL N/A 06/10/2015   Distal gastritis, distal esophageal stricture s/p dilation   ESOPHAGOGASTRODUODENOSCOPY (EGD) WITH PROPOFOL N/A 02/26/2017   Procedure: ESOPHAGOGASTRODUODENOSCOPY (EGD) WITH PROPOFOL;  Surgeon: West Bali, MD;  [facility];  Service: Endoscopy;  Laterality: N/A;   FLEXIBLE SIGMOIDOSCOPY  09/11/2011   [id number] Internal hemorrhoids   HOLMIUM LASER APPLICATION Left 10/21/2014   Procedure: HOLMIUM LASER APPLICATION;  Surgeon: Marcine Matar, MD;  Location: [facility];  Service: Urology;  Laterality: Left;   HOLMIUM LASER APPLICATION Right 06/28/2015   Procedure: HOLMIUM LASER APPLICATION;  Surgeon: Marcine Matar, MD;  [facility];  Service: Urology;  Laterality: Right;   HOLMIUM LASER APPLICATION  02/23/2020   Procedure: HOLMIUM LASER APPLICATION;  Surgeon: Marcine Matar, MD;  [facility];  Service: Urology;;   HOLMIUM LASER APPLICATION Left 10/18/2020   Procedure: HOLMIUM LASER APPLICATION;  Surgeon: Retta Diones,  Jeannett Senior, MD;  [facility];  Service: Urology;  Laterality: Left;   HOLMIUM LASER APPLICATION Right 10/25/2022   Procedure: HOLMIUM LASER APPLICATION;  Surgeon: Malen Gauze, MD;  [facility];  Service: Urology;  Laterality: Right;   MASS EXCISION Left 03/04/2015   Procedure: EXCISION OF SOFT TISSUE NEOPLASM LEFT ARM;  Surgeon: Franky Macho, MD;  Location: [facility];  Service: General;  Laterality: Left;    PERCUTANEOUS NEPHROSTOLITHOTOMY Bilateral 05/2010   @[facility]    POLYPECTOMY N/A 03/17/2013   Procedure: GASTRIC POLYPECTOMY;  Surgeon: West Bali, MD;  [facility];  Service: Endoscopy;  Laterality: N/A;   POLYPECTOMY  02/26/2017   Procedure: POLYPECTOMY;  Surgeon: West Bali, MD;  [facility];  Service: Endoscopy;;  polyp at cecum, ascending colon polyps x3, hepatic flexure polyps x8, transverse colon polyps x8    POLYPECTOMY  09/22/2019   Procedure: POLYPECTOMY;  Surgeon: West Bali, MD;  [facility];  Service: Endoscopy;;   REMOVAL RIGHT THIGH CYST  2006   SAVORY DILATION N/A 03/17/2013   Procedure: SAVORY DILATION;  Surgeon: West Bali, MD;  [facility];  Service: Endoscopy;  Laterality: N/A;  #12.8, 14, 15, 16 dilators used   SAVORY DILATION N/A 06/10/2015   Procedure: SAVORY DILATION;  Surgeon: West Bali, MD;  [facility];  Service: Endoscopy;  Laterality: N/A;   VAGINAL HYSTERECTOMY  1970's    Home Medications:  Allergies as of 08/14/2023       Reactions   Ace Inhibitors Cough   Patient doesn't recall Other Reaction(s): GI Intolerance   Cephalexin Diarrhea, Nausea And Vomiting   Other Reaction(s): GI Intolerance   Nitrofurantoin Diarrhea, Nausea And Vomiting   Penicillins Hives, Other (See Comments)        Medication List        Accurate as of August 14, 2023  1:27 PM. If you have any questions, ask your nurse or doctor.          acetaminophen 500 MG tablet Commonly known as: TYLENOL Take 2 tablets (1,000 mg total) by mouth at bedtime.   amLODipine 10 MG tablet Commonly known as: NORVASC TAKE 1 TABLET BY MOUTH EVERY DAY   aspirin EC 81 MG tablet Take 81 mg by mouth daily. Swallow whole.   cyanocobalamin 500 MCG tablet Commonly known as: VITAMIN B12 Take 1 tablet (500 mcg total) by mouth daily.   diclofenac Sodium 1 % Gel Commonly known as: VOLTAREN APPLY 4 G TOPICALLY 4 (FOUR) TIMES DAILY  AS NEEDED (PAIN).   diphenhydramine-acetaminophen 25-500 MG Tabs tablet Commonly known as: TYLENOL PM Take 2 tablets by mouth at bedtime.   ezetimibe 10 MG tablet Commonly known as: Zetia Take 1 tablet (10 mg total) by mouth daily.   folic acid 1 MG tablet Commonly known as: FOLVITE Take 1 tablet (1 mg total) by mouth daily.   gabapentin 300 MG capsule Commonly known as: NEURONTIN TAKE 1 CAPSULE BY MOUTH THREE TIMES A DAY AS NEEDED   Magnesium 400 MG Caps Take 1 tablet by mouth daily.   nitroGLYCERIN 0.4 MG SL tablet Commonly known as: NITROSTAT PLACE 1 TABLET (0.4 MG TOTAL) UNDER THE TONGUE EVERY 5 (FIVE) MINUTES AS NEEDED FOR CHEST PAIN. UP TO 3 TABLETS, THEN CALL 911.   OneTouch Delica Lancets 30G Misc Once daily testing dx e11.9   OneTouch Ultra test strip Generic drug: glucose blood USE AS DIRECTED   pantoprazole 40 MG tablet  Commonly known as: PROTONIX TAKE 1 TABLET BY MOUTH TWICE A DAY   PARoxetine 40 MG tablet Commonly known as: PAXIL Take 1 tablet (40 mg total) by mouth in the morning.   rosuvastatin 20 MG tablet Commonly known as: CRESTOR TAKE 1 TABLET BY MOUTH EVERY DAY   sennosides-docusate sodium 8.6-50 MG tablet Commonly known as: SENOKOT-S Take 1 tablet by mouth daily.   Symbicort 80-4.5 MCG/ACT inhaler Generic drug: budesonide-formoterol INHALE 2 PUFFS INTO THE LUNGS TWICE A DAY   Tradjenta 5 MG Tabs tablet Generic drug: linagliptin Take 1 tablet (5 mg total) by mouth daily.   traMADol 50 MG tablet Commonly known as: ULTRAM Take 1 tablet (50 mg total) by mouth every 6 (six) hours as needed.   Trulicity 0.75 MG/0.5ML Soaj Generic drug: Dulaglutide INJECT 0.75 MG INTO THE SKIN WEEKLY   UNABLE TO FIND Diabetic shoes x 1 pair, inserts x 3 pair  DX E11.9        Allergies:  Allergies  Allergen Reactions   Ace Inhibitors Cough    Patient doesn't recall  Other Reaction(s): GI Intolerance   Cephalexin Diarrhea and Nausea And  Vomiting    Other Reaction(s): GI Intolerance   Nitrofurantoin Diarrhea and Nausea And Vomiting   Penicillins Hives and Other (See Comments)    Family History: Family History  Problem Relation Age of Onset   Hypertension Mother    Diabetes Mother    Heart failure Mother    Dementia Mother    Emphysema Father    Hypertension Father    Diabetes Brother    GER disease Brother    Hypertension Sister    Hypertension Sister    Cancer Other        family history    Diabetes Other        family history    Heart defect Other        famiily history    Arthritis Other        family history    Anesthesia problems Neg Hx    Hypotension Neg Hx    Malignant hyperthermia Neg Hx    Pseudochol deficiency Neg Hx    Colon cancer Neg Hx     Social History:  reports that she quit smoking about 34 years ago. Her smoking use included cigarettes. She started smoking about 48 years ago. She has a 20 pack-year smoking history. She has been exposed to tobacco smoke. She has never used smokeless tobacco. She reports that she does not drink alcohol and does not use drugs.  ROS: All other review of systems were reviewed and are negative except what is noted above in HPI  Physical Exam: BP 116/73   Pulse 91   Constitutional:  Alert and oriented, No acute distress. HEENT: [place] AT, moist mucus membranes.  Trachea midline, no masses. Cardiovascular: No clubbing, cyanosis, or edema. Respiratory: Normal respiratory effort, no increased work of breathing. GI: Abdomen is soft, nontender, nondistended, no abdominal masses GU: No CVA tenderness.  Lymph: No cervical or inguinal lymphadenopathy. Skin: No rashes, bruises or suspicious lesions. Neurologic: Grossly intact, no focal deficits, moving all 4 extremities. Psychiatric: Normal mood and affect.  Laboratory Data: Lab Results  Component Value Date   WBC 9.8 04/25/2023   HGB 10.3 (L) 04/25/2023   HCT 33.0 (L) 04/25/2023   MCV 92 04/25/2023   PLT  395 04/25/2023    Lab Results  Component Value Date   CREATININE 1.39 (H) 08/05/2023    No  results found for: "PSA"  No results found for: "TESTOSTERONE"  Lab Results  Component Value Date   HGBA1C 5.9 (H) 04/25/2023    Urinalysis    Component Value Date/Time   COLORURINE YELLOW 10/17/2022 1658   APPEARANCEUR Clear 11/21/2022 1033   LABSPEC 1.012 10/17/2022 1658   PHURINE 7.0 10/17/2022 1658   GLUCOSEU Negative 11/21/2022 1033   HGBUR LARGE (A) 10/17/2022 1658   HGBUR moderate 02/07/2010 0907   BILIRUBINUR Negative 11/21/2022 1033   KETONESUR NEGATIVE 10/17/2022 1658   PROTEINUR 1+ (A) 11/21/2022 1033   PROTEINUR 100 (A) 10/17/2022 1658   UROBILINOGEN 0.2 10/12/2022 0953   UROBILINOGEN 0.2 03/01/2015 1600   NITRITE Negative 11/21/2022 1033   NITRITE NEGATIVE 10/17/2022 1658   LEUKOCYTESUR Trace (A) 11/21/2022 1033   LEUKOCYTESUR LARGE (A) 10/17/2022 1658    Lab Results  Component Value Date   LABMICR 37.1 04/25/2023   WBCUA 6-10 (A) 11/21/2022   LABEPIT 0-10 11/21/2022   MUCUS Present 01/11/2022   BACTERIA None seen 11/21/2022    Pertinent Imaging: Renal US 07/29/2023: Images reviewed and discussed with the patient Results for orders placed during the hospital encounter of 12/13/20  DG Abd 1 View  Narrative CLINICAL DATA:  Kidney stone status post removal. Question migrated stent.  EXAM: ABDOMEN - 1 VIEW  COMPARISON:  X-ray abdomen 09/28/2020,  FINDINGS: Left ureteral stent with proximal pigtail overlying the expected region of the renal shadow and distal pigtail overlying the expected region of the urinary bladder. The bowel gas pattern is normal. No radio-opaque calculi or other significant radiographic abnormality are seen.  IMPRESSION: Left ureteral stent with proximal pigtail overlying the expected region of the left renal shadow and distal pigtail overlying the expected region of the urinary bladder. Markedly limited evaluation of exact  positioning due to bowel gas overlying the abdomen and pelvis as well as no post stent placement comparison images. If continued concern, recommend CT pelvis for further evaluation.   Electronically Signed By: Tish Frederickson M.D. On: 12/13/2020 21:09  No results found for this or any previous visit.  No results found for this or any previous visit.  No results found for this or any previous visit.  Results for orders placed during the hospital encounter of 07/29/23  US RENAL  Narrative : PROCEDURE: US RENAL  HISTORY: Patient is a 78 y/o  F with nephrolithiasis.  COMPARISON: CT renal 06/19/2023, U/S renal 02/04/2023.  TECHNIQUE: Two-dimensional grayscale and color Doppler ultrasound of the kidneys was performed.  FINDINGS: The urinary bladder demonstrates normal anechoic echogenicity. The bilateral ureteral jets are visualized.  The right kidney measures 10.5 x 6.4 x 4.5 cm. Renal cortical echotexture is increased. There is no hydronephrosis. There are no stones. There is a simple cyst measuring 2.2 cm at the superior pole.  The left kidney measures 11.2 x 5.8 x 4.8 cm. Renal cortical echotexture is increased. There is mild caliectasis. There is a possible stone measuring 0.9 cm at the midpole. There is a simple cyst measuring 0.8 cm at the midpole.  IMPRESSION: 1. Increased echogenicity of the bilateral renal cortices with multiple cysts, consistent with medical renal disease.  2.  Possible left renal calculus and mild caliectasis.  Thank you for allowing [place] to assist in the care of this patient.   Electronically Signed By: Lestine Box M.D. On: 07/30/2023 23:24  No results found for this or any previous visit.  No results found for this or any previous visit.  Results for  orders placed during the hospital encounter of 06/19/23  CT RENAL STONE STUDY  Narrative CLINICAL DATA:  Right-sided abdominal/flank pain. Concern for kidney stone.  EXAM: CT  ABDOMEN AND PELVIS WITHOUT CONTRAST  TECHNIQUE: Multidetector CT imaging of the abdomen and pelvis was performed following the standard protocol without IV contrast.  RADIATION DOSE REDUCTION: This exam was performed according to the departmental dose-optimization program which includes automated exposure control, adjustment of the mA and/or kV according to patient size and/or use of iterative reconstruction technique.  COMPARISON:  CT abdomen pelvis dated 10/17/2022.  FINDINGS: Evaluation of this exam is limited in the absence of intravenous contrast.  Lower chest: There is eventration of the right hemidiaphragm with minimal right lung base atelectasis. The visualized left lung base is clear.  No intra-abdominal free air or free fluid.  Hepatobiliary: The liver is unremarkable. No biliary ductal dilatation. The gallbladder is unremarkable.  Pancreas: Unremarkable. No pancreatic ductal dilatation or surrounding inflammatory changes.  Spleen: Normal in size without focal abnormality.  Adrenals/Urinary Tract: The adrenal glands unremarkable. Multiple small nonobstructing bilateral renal calculi measure up to 3 mm. The there is mild left hydronephrosis. No obstructing stone. There is an extrarenal pelvis on the right with mild pelviectasis. There is no hydronephrosis on the right. The visualized ureters and urinary bladder appear unremarkable.  Stomach/Bowel: There is sigmoid diverticulosis and scattered colonic diverticula. There is no bowel obstruction or active inflammation. The appendix is normal.  Vascular/Lymphatic: Mild aortoiliac atherosclerotic disease. The IVC is unremarkable. No portal venous gas. There is no adenopathy.  Reproductive: Hysterectomy.  No suspicious adnexal masses.  Other: None  Musculoskeletal: Degenerative changes of the spine. No acute osseous pathology.  IMPRESSION: 1. Mild left hydronephrosis. No obstructing stone. 2. Multiple small  nonobstructing bilateral renal calculi. 3. Colonic diverticulosis. No bowel obstruction. Normal appendix. 4.  Aortic Atherosclerosis (ICD10-I70.0).   Electronically Signed By: Elgie Collard M.D. On: 06/19/2023 15:46   Assessment & Plan:    1. Calculus of kidney (Primary) Followup 6 months with  renal US - Urinalysis, Routine w reflex microscopic  2. Nocturia We will trial mirabegron 25mg  daily   No follow-ups on file.  Wilkie Aye, MD  [facility] [place]

## 2023-08-18 DIAGNOSIS — R0902 Hypoxemia: Secondary | ICD-10-CM | POA: Diagnosis not present

## 2023-08-20 ENCOUNTER — Ambulatory Visit (HOSPITAL_COMMUNITY)
Admission: RE | Admit: 2023-08-20 | Discharge: 2023-08-20 | Disposition: A | Discharge status: Home / Self Care | Payer: 59 | Source: Ambulatory Visit | Attending: Family Medicine | Admitting: Family Medicine

## 2023-08-20 ENCOUNTER — Encounter (HOSPITAL_COMMUNITY): Payer: Self-pay

## 2023-08-20 ENCOUNTER — Encounter: Payer: Self-pay | Admitting: Urology

## 2023-08-20 DIAGNOSIS — N6315 Unspecified lump in the right breast, overlapping quadrants: Secondary | ICD-10-CM | POA: Diagnosis not present

## 2023-08-20 DIAGNOSIS — R928 Other abnormal and inconclusive findings on diagnostic imaging of breast: Secondary | ICD-10-CM

## 2023-08-20 DIAGNOSIS — C50811 Malignant neoplasm of overlapping sites of right female breast: Secondary | ICD-10-CM | POA: Diagnosis not present

## 2023-08-20 DIAGNOSIS — R921 Mammographic calcification found on diagnostic imaging of breast: Secondary | ICD-10-CM | POA: Diagnosis not present

## 2023-08-20 HISTORY — PX: BREAST BIOPSY: SHX20

## 2023-08-20 MED ORDER — LIDOCAINE HCL (PF) 2 % IJ SOLN
INTRAMUSCULAR | Status: AC
  Filled 2023-08-20: qty 10

## 2023-08-20 MED ORDER — LIDOCAINE HCL (PF) 2 % IJ SOLN
10.0000 mL | Freq: Once | INTRAMUSCULAR | Status: AC
  Administered 2023-08-20: 10 mL

## 2023-08-20 MED ORDER — LIDOCAINE-EPINEPHRINE (PF) 1 %-1:200000 IJ SOLN
10.0000 mL | Freq: Once | INTRAMUSCULAR | Status: AC
  Administered 2023-08-20: 10 mL via INTRADERMAL

## 2023-08-20 NOTE — Patient Instructions (Signed)

## 2023-08-22 LAB — SURGICAL PATHOLOGY

## 2023-08-23 ENCOUNTER — Other Ambulatory Visit: Payer: Self-pay | Admitting: *Deleted

## 2023-08-23 ENCOUNTER — Encounter (HOSPITAL_COMMUNITY): Payer: Self-pay | Admitting: Hematology

## 2023-08-23 DIAGNOSIS — C50919 Malignant neoplasm of unspecified site of unspecified female breast: Secondary | ICD-10-CM

## 2023-08-25 NOTE — Progress Notes (Signed)
 [facility] [address] [place] [address]   Clinic Day:  08/26/2023  Referring physician: Kerri Perches, MD  Patient Care Team: Kerri Perches, MD as PCP - General (Family Medicine) Wyline Mood Dorothe Pea, MD as PCP - Cardiology (Cardiology) West Bali, MD (Inactive) (Gastroenterology) Bjorn Pippin, MD as Attending Physician (Urology) York Spaniel, MD (Inactive) (Neurology) Marcine Matar, MD as Consulting Physician (Urology) Jethro Bolus, MD as Consulting Physician (Ophthalmology) Doreatha Massed, MD as Medical Oncologist (Medical Oncology) Therese Sarah, RN as Oncology Nurse Navigator (Medical Oncology)   ASSESSMENT & PLAN:   Assessment:  1.  Stage I (T1a N0 M0 G2) right breast UOQ invasive lobular carcinoma: - Abnormal screening mammogram on 06/28/2023 - Right breast diagnostic mammogram/ultrasound: Oval hypoechoic mass with slight margin irregularity in the right breast at 9 o'clock position, 8 cm from nipple measuring 0.3 x 0.3 x 0.3 cm.  Normal lymph nodes in the right axilla. - Right breast 9:00 biopsy (08/20/2023): Grade 2 invasive lobular carcinoma, E-cadherin negative.  Associated DCIS.  ER 60% positive, weak staining, PR 70% positive, weak to moderate staining, HER2 (0), Ki-67 <1%.  2.  Social/family history: - She lives by herself at home and is independent of ADLs and IADLs.  Quit smoking 30 years ago. - 2 maternal first cousins had breast cancer.  Niece had breast cancer.  Maternal aunt had "cancer in the stomach".   3.  IgG kappa smoldering myeloma: - Diagnosed in September 2018 after nephrology work-up found M spike in urine (8.3 mg per 24 hours) and serum (1.5 g/dL) - Immunofixation shows IgG monoclonal protein with kappa light chain specificity - Bone marrow biopsy on 02/19/2017 showed trilineage hematopoiesis, plasma cells 14%, cytogenetics normal, FISH panel normal - Skeletal survey on 02/01/2020 did not show  any lytic lesions. - Skeletal survey (02/16/2021): 2 adjacent 4 mm lucencies on the right scapula at the base of the glenoid, which were not seen on prior exam.  No other focal bone lesion. - Scapula x-ray (02/21/2021): Small lucent lesions in the scapula at the base of the glenoid, better seen on skeletal survey - Skeletal survey (11/29/2021): Stable lucencies in scapula on the right, no new lytic or destructive lesion is seen.   Plan:  1.  Stage I (T1a N0 M0 G2 ER/PR+, HER2-) right breast UOQ invasive lobular carcinoma: - We have reviewed results of the imaging and discussed pathology report in detail. - She has an appointment to see Dr. Lovell Sheehan for lumpectomy.  Given her advanced age, and low-grade small sized tumor, sentinel lymph node biopsy may be omitted. - Recommend follow-up in 3 to 4 weeks after surgery.  She will need 5 years of antiestrogen therapy.  2.  IgG kappa smoldering myeloma: - She did not have recent myeloma labs.  Denies any new onset bone pains. - Will check SPEP, immunofixation, chemistries and skeletal survey.  3.  Anemia from iron deficiency: - Will check CBC, ferritin, iron panel, B12 and folic acid levels today. - Will plan to give INFeD 1 g IV x 1 if iron levels are low.    Orders Placed This Encounter  Procedures   DG Bone Survey Met    Standing Status:   Future    Number of Occurrences:   1    Expected Date:   09/02/2023    Expiration Date:   08/25/2024    Reason for Exam (SYMPTOM  OR DIAGNOSIS REQUIRED):   smoldering myeloma  Preferred imaging location?:   [facility]    Release to patient:   Immediate   DG Bone Density    Standing Status:   Future    Expected Date:   09/02/2023    Expiration Date:   08/25/2024    Reason for Exam (SYMPTOM  OR DIAGNOSIS REQUIRED):   AI therapy evaluation    Preferred imaging location?:   [facility]    Release to patient:   Immediate   CBC with Differential    Standing Status:   Future    Number of  Occurrences:   1    Expected Date:   08/26/2023    Expiration Date:   08/25/2024   Comprehensive metabolic panel    Standing Status:   Future    Number of Occurrences:   1    Expected Date:   08/26/2023    Expiration Date:   08/25/2024   Protein electrophoresis, serum    Standing Status:   Future    Number of Occurrences:   1    Expected Date:   08/26/2023    Expiration Date:   08/25/2024   Kappa/lambda light chains    Standing Status:   Future    Number of Occurrences:   1    Expected Date:   08/26/2023    Expiration Date:   08/25/2024   Ferritin    Standing Status:   Future    Number of Occurrences:   1    Expected Date:   08/26/2023    Expiration Date:   08/25/2024   Iron and TIBC ([facility] ONLY)    Standing Status:   Future    Number of Occurrences:   1    Expected Date:   08/26/2023    Expiration Date:   08/25/2024   Vitamin B12    Standing Status:   Future    Number of Occurrences:   1    Expected Date:   08/26/2023    Expiration Date:   08/25/2024   Folate    Standing Status:   Future    Number of Occurrences:   1    Expected Date:   08/26/2023    Expiration Date:   08/25/2024      I,Katie Daubenspeck,acting as a scribe for Doreatha Massed, MD.,have documented all relevant documentation on the behalf of Doreatha Massed, MD,as directed by  Doreatha Massed, MD while in the presence of Doreatha Massed, MD.   I, Doreatha Massed MD, have reviewed the above documentation for accuracy and completeness, and I agree with the above.   Doreatha Massed, MD   3/17/20251:24 PM  CHIEF COMPLAINT/PURPOSE OF CONSULT:   Diagnosis: lobular right breast cancer; history of IgG kappa smoldering myeloma & iron deficiency anemia    Cancer Staging  Breast cancer of upper-outer quadrant of right female breast [facility]) Staging form: Breast, AJCC 8th Edition - Clinical stage from 08/26/2023: Stage IA (cT1a, cN0, cM0, G2, ER+, PR+, HER2-) - Unsigned     Prior Therapy: Intermittent IV iron infusions (last in July 2023)   Current Therapy: Surgery being planned   HISTORY OF PRESENT ILLNESS:   Oncology History   No history exists.      Cheryl Abbott is a 78 y.o. female presenting to clinic today for evaluation of lobular right breast cancer at the request of breast center. She was previously seen by PA Rebekah for smoldering myeloma and IDA, last visit on 03/14/22.  She had routine screening mammogram  on 06/28/23, which showed suspicious right breast findings. Right diagnostic mammogram and [place] on 08/06/23 showed an indeterminate 0.3 cm mass at 9 o'clock. Biopsy of the mass on 08/20/23 revealed invasive lobular carcinoma, grade 2, and intermediate grade DCIS. Prognostic panel showed ER 60% weakly positive, PR 70% weakly to moderately positive, HER2 negative (0), and Ki-67 <1%.  Today, she states that she is doing well overall. Her appetite level is at 40%. Her energy level is at 10%.  PAST MEDICAL HISTORY:   Past Medical History: Past Medical History:  Diagnosis Date   Anxiety disorder    Arthritis    Chronic back pain    Chronic pain of both shoulders    per pt told due to cervical spine pinched nerve   CKD (chronic kidney disease), stage III [facility])    nephrologist--- dr Wolfgang Phoenix;   due to tubular necrosis   Claustrophobia    Depression    Edema of both lower extremities    GERD (gastroesophageal reflux disease)    Headache    Hemorrhoids    w/ intermittant bleeding   History of adenomatous polyp of colon    History of kidney stones    Hx-TIA (transient ischemic attack) 02/26/2016   per pt no residual   Hydronephrosis, left    followed by urologist--- dr Retta Diones;   persistant   Hyperlipidemia    Hypertension    Iron deficiency    followed by [facility] w/ hx iron infusions   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   oncologist--- [facility];  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, active survillence   Lactose  intolerance in adult 02/01/2016   Left ureteral calculus    Lumbar disc disease with radiculopathy    Nephrolithiasis    per CT 08-11-2021  left > right   Neuropathy, peripheral    Nocturia more than twice per night    Normocytic anemia    OSA (obstructive sleep apnea)    per study 2007;   per pt no Bipap use since 2021   Renal atrophy, bilateral    Renal cyst, right    Sciatica    Sigmoid diverticulosis    Type 2 diabetes mellitus (HCC)     Surgical History: Past Surgical History:  Procedure Laterality Date   BIOPSY N/A 03/17/2013   Procedure: GASTRIC BIOPSIES;  Surgeon: West Bali, MD;  Location: [facility];  Service: Endoscopy;  Laterality: N/A;   BIOPSY  06/10/2015   Procedure: BIOPSY;  Surgeon: West Bali, MD;  Location: [facility];  Service: Endoscopy;;  gastric biopsy   BREAST BIOPSY Right 08/20/2023   path pending   BREAST BIOPSY Right 08/20/2023   [place] RT BREAST BX W LOC DEV 1ST LESION IMG BX SPEC US GUIDE 08/20/2023 Edwin Cap, MD [facility]-ULTRASOUND   CARDIAC CATHETERIZATION  05/11/2003   @ [facility] by dr t. Tresa Endo;  Abnormal cardiolite/   Normal coronary arteries and normal LVF,  ef  63%   CARDIOVASCULAR STRESS TEST  01/01/2014   normal lexiscan cardiolite/  no ischemia/ infarct/  normal LV function and wall motion ,  ef 81%   COLONOSCOPY WITH PROPOFOL N/A 02/26/2017   Procedure: COLONOSCOPY WITH PROPOFOL;  Surgeon: West Bali, MD;  [facility];  Service: Endoscopy;  Laterality: N/A;  11:00am   COLONOSCOPY WITH PROPOFOL N/A 09/22/2019   Procedure: COLONOSCOPY WITH PROPOFOL;  Surgeon: West Bali, MD;  [facility];  Service: Endoscopy;  Laterality: N/A;  1:15   CYSTO/   RIGHT URETEROSOCOPY LASER LITHOTRIPSY STONE EXTRACTION  10/13/2004   CYSTO/  RIGHT RETROGRADE PYELOGRAM/  PLACMENT RIGHT URETERAL STENT  01/10/2010   CYSTOSCOPY W/ URETERAL STENT PLACEMENT Right 08/19/2015   Procedure: CYSTOSCOPY WITH  RETROGRADE PYELOGRAM/URETERAL STENT PLACEMENT;  Surgeon: Marcine Matar, MD;  Location: [facility];  Service: Urology;  Laterality: Right;   CYSTOSCOPY W/ URETERAL STENT PLACEMENT Left 02/23/2020   Procedure: CYSTOSCOPY LEFT  RETROGRADE PYELOGRAM LEFT URETEROSCOPY URETERAL STENT PLACEMENT;  Surgeon: Marcine Matar, MD;  Location: [facility];  Service: Urology;  Laterality: Left;   CYSTOSCOPY W/ URETERAL STENT PLACEMENT Right 09/24/2022   Procedure: CYSTOSCOPY WITH RETROGRADE PYELOGRAM/URETERAL STENT PLACEMENT;  Surgeon: Malen Gauze, MD;  Location: [facility];  Service: Urology;  Laterality: Right;   CYSTOSCOPY W/ URETERAL STENT PLACEMENT Right 10/25/2022   Procedure: RIGHT URETERAL STENT EXCHANGE;  Surgeon: Malen Gauze, MD;  Location: [facility];  Service: Urology;  Laterality: Right;   CYSTOSCOPY WITH RETROGRADE PYELOGRAM, URETEROSCOPY AND STENT PLACEMENT Left 10/21/2014   Procedure: CYSTOSCOPY WITH RETROGRADE PYELOGRAM, URETEROSCOPY AND STENT PLACEMENT;  Surgeon: Marcine Matar, MD;  Location[facility];  Service: Urology;  Laterality: Left;   CYSTOSCOPY WITH RETROGRADE PYELOGRAM, URETEROSCOPY AND STENT PLACEMENT Right 11/22/2015   Procedure: CYSTOSCOPY, RIGHT URETERAL STENT REMOVAL; RIGHT RETROGRADE PYELOGRAM, RIGHT URETEROSCOPY WITH BALLOON DILATION; RIGHT URETERAL STENT PLACEMENT;  Surgeon: Marcine Matar, MD;  Location: [facility];  Service: Urology;  Laterality: Right;   CYSTOSCOPY WITH RETROGRADE PYELOGRAM, URETEROSCOPY AND STENT PLACEMENT Left 10/18/2020   Procedure: CYSTOSCOPY WITH LEFT RETROGRADE PYELOGRAM, LEFT URETEROSCOPY  WITH LASER AND LEFT URETERAL STENT PLACEMENT;  Surgeon: Marcine Matar, MD;  Location: [facility];  Service: Urology;  Laterality: Left;   CYSTOSCOPY WITH RETROGRADE PYELOGRAM, URETEROSCOPY AND STENT PLACEMENT Left 10/26/2021   Procedure: CYSTOSCOPY WITH RETROGRADE PYELOGRAM, URETEROSCOPY, STONE EXTRACTION  AND STENT PLACEMENT;  Surgeon:  Marcine Matar, MD;  Location: [facility];  Service: Urology;  Laterality: Left;   CYSTOSCOPY WITH RETROGRADE PYELOGRAM, URETEROSCOPY AND STENT PLACEMENT Bilateral 10/25/2022   Procedure: CYSTOSCOPY WITH RETROGRADE PYELOGRAM, URETEROSCOPY;  Surgeon: Malen Gauze, MD;  Location: [facility];  Service: Urology;  Laterality: Bilateral;   CYSTOSCOPY/RETROGRADE/URETEROSCOPY/STONE EXTRACTION WITH BASKET Bilateral 08/02/2015   Procedure: CYSTOSCOPY; BILATERAL RETROGRADE PYELOGRAMS; BILATERAL URETEROSCOPY, STONE EXTRACTION WITH BASKET;  Surgeon: Marcine Matar, MD;  Location: [facility];  Service: Urology;  Laterality: Bilateral;   CYSTOSCOPY/RETROGRADE/URETEROSCOPY/STONE EXTRACTION WITH BASKET Right 06/28/2015   Procedure: CYSTOSCOPY/RETROGRADE/URETEROSCOPY/STONE EXTRACTION WITH BASKET, RIGHT URETERAL DOUBLE J STENT PLACEMENT;  Surgeon: Marcine Matar, MD;  Location: [facility];  Service: Urology;  Laterality: Right;   ESOPHAGOGASTRODUODENOSCOPY (EGD) WITH PROPOFOL N/A 03/17/2013   Procedure: ESOPHAGOGASTRODUODENOSCOPY (EGD) WITH PROPOFOL;  Surgeon: West Bali, MD;  Location: [facility];  Service: Endoscopy;  Laterality: N/A;   ESOPHAGOGASTRODUODENOSCOPY (EGD) WITH PROPOFOL N/A 06/10/2015   Distal gastritis, distal esophageal stricture s/p dilation   ESOPHAGOGASTRODUODENOSCOPY (EGD) WITH PROPOFOL N/A 02/26/2017   Procedure: ESOPHAGOGASTRODUODENOSCOPY (EGD) WITH PROPOFOL;  Surgeon: West Bali, MD;  [facility];  Service: Endoscopy;  Laterality: N/A;   FLEXIBLE SIGMOIDOSCOPY  09/11/2011   WUJ:[id number] Internal hemorrhoids   HOLMIUM LASER APPLICATION Left 10/21/2014   Procedure: HOLMIUM LASER APPLICATION;  Surgeon: Marcine Matar, MD;  Location: [facility];  Service: Urology;  Laterality: Left;   HOLMIUM LASER APPLICATION Right 06/28/2015   Procedure: HOLMIUM LASER APPLICATION;  Surgeon: Marcine Matar, MD;  Location[facility];  Service:  Urology;   Laterality: Right;   HOLMIUM LASER APPLICATION  02/23/2020   Procedure: HOLMIUM LASER APPLICATION;  Surgeon: Marcine Matar, MD;  Location: [facility];  Service: Urology;;   HOLMIUM LASER APPLICATION Left 10/18/2020   Procedure: HOLMIUM LASER APPLICATION;  Surgeon: Marcine Matar, MD;  Location: [facility];  Service: Urology;  Laterality: Left;   HOLMIUM LASER APPLICATION Right 10/25/2022   Procedure: HOLMIUM LASER APPLICATION;  Surgeon: Malen Gauze, MD;  Location: [facility];  Service: Urology;  Laterality: Right;   MASS EXCISION Left 03/04/2015   Procedure: EXCISION OF SOFT TISSUE NEOPLASM LEFT ARM;  Surgeon: Franky Macho, MD;  Location: [facility];  Service: General;  Laterality: Left;   PERCUTANEOUS NEPHROSTOLITHOTOMY Bilateral 05/2010   @[facility]    POLYPECTOMY N/A 03/17/2013   Procedure: GASTRIC POLYPECTOMY;  Surgeon: West Bali, MD;  Location: [facility];  Service: Endoscopy;  Laterality: N/A;   POLYPECTOMY  02/26/2017   Procedure: POLYPECTOMY;  Surgeon: West Bali, MD;  [facility];  Service: Endoscopy;;  polyp at cecum, ascending colon polyps x3, hepatic flexure polyps x8, transverse colon polyps x8    POLYPECTOMY  09/22/2019   Procedure: POLYPECTOMY;  Surgeon: West Bali, MD;  [facility];  Service: Endoscopy;;   REMOVAL RIGHT THIGH CYST  2006   SAVORY DILATION N/A 03/17/2013   Procedure: SAVORY DILATION;  Surgeon: West Bali, MD;  Location: [facility];  Service: Endoscopy;  Laterality: N/A;  #12.8, 14, 15, 16 dilators used   SAVORY DILATION N/A 06/10/2015   Procedure: SAVORY DILATION;  Surgeon: West Bali, MD;  [facility];  Service: Endoscopy;  Laterality: N/A;   VAGINAL HYSTERECTOMY  1970's    Social History: Social History   Socioeconomic History   Marital status: Divorced    Spouse name: Not on file   Number of children: 2   Years of education: 12   Highest education level: 12th grade  Occupational History   Occupation:  retired   Tobacco Use   Smoking status: Former    Current packs/day: 0.00    Average packs/day: 1 pack/day for 20.0 years (20.0 ttl pk-yrs)    Types: Cigarettes    Start date: 09/21/1974    Quit date: 09/20/1988    Years since quitting: 34.9    Passive exposure: Past   Smokeless tobacco: Never  Vaping Use   Vaping status: Never Used  Substance and Sexual Activity   Alcohol use: No    Alcohol/week: 0.0 standard drinks of alcohol   Drug use: No   Sexual activity: Not Currently    Partners: Male    Birth control/protection: Surgical  Other Topics Concern   Not on file  Social History Narrative   Patient is right handed   Patient drinks some caffeine daily.   Social Drivers of Corporate investment banker Strain: Low Risk  (10/24/2022)   Overall Financial Resource Strain (CARDIA)    Difficulty of Paying Living Expenses: Not very hard  Food Insecurity: No Food Insecurity (10/24/2022)   Hunger Vital Sign    Worried About Running Out of Food in the Last Year: Never true    Ran Out of Food in the Last Year: Never true  Transportation Needs: No Transportation Needs (10/24/2022)   PRAPARE - Administrator, Civil Service (Medical): No    Lack of Transportation (Non-Medical): No  Physical Activity: Inactive (07/06/2022)   Exercise Vital Sign    Days of Exercise per  Week: 0 days    Minutes of Exercise per Session: 0 min  Stress: No Stress Concern Present (07/06/2022)   [employer] of Occupational Health - Occupational Stress Questionnaire    Feeling of Stress : Not at all  Social Connections: Moderately Isolated (07/06/2022)   Social Connection and Isolation Panel [NHANES]    Frequency of Communication with Friends and Family: More than three times a week    Frequency of Social Gatherings with Friends and Family: Once a week    Attends Religious Services: More than 4 times per year    Active Member of [employer] or Organizations: No    Attends Banker Meetings:  Never    Marital Status: Divorced  Catering manager Violence: Not At Risk (10/17/2022)   Humiliation, Afraid, Rape, and Kick questionnaire    Fear of Current or Ex-Partner: No    Emotionally Abused: No    Physically Abused: No    Sexually Abused: No    Family History: Family History  Problem Relation Age of Onset   Hypertension Mother    Diabetes Mother    Heart failure Mother    Dementia Mother    Emphysema Father    Hypertension Father    Diabetes Brother    GER disease Brother    Hypertension Sister    Hypertension Sister    Cancer Other        family history    Diabetes Other        family history    Heart defect Other        famiily history    Arthritis Other        family history    Anesthesia problems Neg Hx    Hypotension Neg Hx    Malignant hyperthermia Neg Hx    Pseudochol deficiency Neg Hx    Colon cancer Neg Hx     Current Medications:  Current Outpatient Medications:    acetaminophen (TYLENOL) 500 MG tablet, Take 2 tablets (1,000 mg total) by mouth at bedtime., Disp: , Rfl:    amLODipine (NORVASC) 10 MG tablet, TAKE 1 TABLET BY MOUTH EVERY DAY, Disp: 90 tablet, Rfl: 1   aspirin EC 81 MG tablet, Take 81 mg by mouth daily. Swallow whole., Disp: , Rfl:    diclofenac Sodium (VOLTAREN) 1 % GEL, APPLY 4 G TOPICALLY 4 (FOUR) TIMES DAILY AS NEEDED (PAIN)., Disp: 400 g, Rfl: 5   diphenhydramine-acetaminophen (TYLENOL PM) 25-500 MG TABS tablet, Take 2 tablets by mouth at bedtime., Disp: , Rfl:    Dulaglutide (TRULICITY) 0.75 MG/0.5ML SOAJ, INJECT 0.75 MG INTO THE SKIN WEEKLY, Disp: 2 mL, Rfl: 1   ezetimibe (ZETIA) 10 MG tablet, Take 1 tablet (10 mg total) by mouth daily., Disp: 90 tablet, Rfl: 1   folic acid (FOLVITE) 1 MG tablet, Take 1 tablet (1 mg total) by mouth daily., Disp: , Rfl:    gabapentin (NEURONTIN) 300 MG capsule, TAKE 1 CAPSULE BY MOUTH THREE TIMES A DAY AS NEEDED, Disp: 90 capsule, Rfl: 5   glucose blood (ONETOUCH ULTRA) test strip, USE AS DIRECTED,  Disp: 100 strip, Rfl: 5   linagliptin (TRADJENTA) 5 MG TABS tablet, Take 1 tablet (5 mg total) by mouth daily., Disp: , Rfl:    Magnesium 400 MG CAPS, Take 1 tablet by mouth daily., Disp: 30 capsule, Rfl: 11   mirabegron ER (MYRBETRIQ) 25 MG TB24 tablet, Take 1 tablet (25 mg total) by mouth daily., Disp: , Rfl:    OneTouch Delica  Lancets 30G MISC, Once daily testing dx e11.9, Disp: 100 each, Rfl: 5   pantoprazole (PROTONIX) 40 MG tablet, TAKE 1 TABLET BY MOUTH TWICE A DAY, Disp: 180 tablet, Rfl: 1   PARoxetine (PAXIL) 40 MG tablet, Take 1 tablet (40 mg total) by mouth in the morning., Disp: 90 tablet, Rfl: 1   rosuvastatin (CRESTOR) 20 MG tablet, TAKE 1 TABLET BY MOUTH EVERY DAY, Disp: 90 tablet, Rfl: 3   sennosides-docusate sodium (SENOKOT-S) 8.6-50 MG tablet, Take 1 tablet by mouth daily., Disp: 30 tablet, Rfl: 2   SYMBICORT 80-4.5 MCG/ACT inhaler, INHALE 2 PUFFS INTO THE LUNGS TWICE A DAY, Disp: 30.6 each, Rfl: 4   traMADol (ULTRAM) 50 MG tablet, Take 1 tablet (50 mg total) by mouth every 6 (six) hours as needed., Disp: 60 tablet, Rfl: 0   UNABLE TO FIND, Diabetic shoes x 1 pair, inserts x 3 pair  DX E11.9, Disp: 1 each, Rfl: 0   vitamin B-12 (VITAMIN B12) 500 MCG tablet, Take 1 tablet (500 mcg total) by mouth daily., Disp: , Rfl:    nitroGLYCERIN (NITROSTAT) 0.4 MG SL tablet, PLACE 1 TABLET (0.4 MG TOTAL) UNDER THE TONGUE EVERY 5 (FIVE) MINUTES AS NEEDED FOR CHEST PAIN. UP TO 3 TABLETS, THEN CALL 911., Disp: 25 tablet, Rfl: 3   Allergies: Allergies  Allergen Reactions   Ace Inhibitors Cough    Patient doesn't recall  Other Reaction(s): GI Intolerance   Cephalexin Diarrhea and Nausea And Vomiting    Other Reaction(s): GI Intolerance   Nitrofurantoin Diarrhea and Nausea And Vomiting   Penicillins Hives and Other (See Comments)    REVIEW OF SYSTEMS:   Review of Systems  Constitutional:  Negative for chills, fatigue and fever.  HENT:   Negative for lump/mass, mouth sores, nosebleeds,  sore throat and trouble swallowing.   Eyes:  Negative for eye problems.  Respiratory:  Negative for cough and shortness of breath.   Cardiovascular:  Negative for chest pain, leg swelling and palpitations.  Gastrointestinal:  Positive for constipation and nausea. Negative for abdominal pain, diarrhea and vomiting.  Genitourinary:  Negative for bladder incontinence, difficulty urinating, dysuria, frequency, hematuria and nocturia.   Musculoskeletal:  Negative for arthralgias, back pain, flank pain, myalgias and neck pain.  Skin:  Negative for itching and rash.  Neurological:  Positive for headaches. Negative for dizziness and numbness.  Hematological:  Does not bruise/bleed easily.  Psychiatric/Behavioral:  Positive for sleep disturbance. Negative for depression and suicidal ideas. The patient is not nervous/anxious.   All other systems reviewed and are negative.    VITALS:   Blood pressure (!) 150/82, pulse 89, temperature 98.3 F (36.8 C), temperature source Oral, resp. rate 18, height 5\' 6"  (1.676 m), weight 174 lb 14.4 oz (79.3 kg), SpO2 92%.  Wt Readings from Last 3 Encounters:  08/26/23 174 lb 14.4 oz (79.3 kg)  07/16/23 180 lb (81.6 kg)  06/19/23 181 lb 6.4 oz (82.3 kg)    Body mass index is 28.23 kg/m.  Performance status (ECOG): 1 - Symptomatic but completely ambulatory  PHYSICAL EXAM:   Physical Exam Vitals and nursing note reviewed. Exam conducted with a chaperone present.  Constitutional:      Appearance: Normal appearance.  Cardiovascular:     Rate and Rhythm: Normal rate and regular rhythm.     Pulses: Normal pulses.     Heart sounds: Normal heart sounds.  Pulmonary:     Effort: Pulmonary effort is normal.     Breath sounds: Normal  breath sounds.  Abdominal:     Palpations: Abdomen is soft. There is no hepatomegaly, splenomegaly or mass.     Tenderness: There is no abdominal tenderness.  Musculoskeletal:     Right lower leg: No edema.     Left lower leg: No  edema.  Lymphadenopathy:     Cervical: No cervical adenopathy.     Right cervical: No superficial, deep or posterior cervical adenopathy.    Left cervical: No superficial, deep or posterior cervical adenopathy.     Upper Body:     Right upper body: No supraclavicular or axillary adenopathy.     Left upper body: No supraclavicular or axillary adenopathy.  Neurological:     General: No focal deficit present.     Mental Status: She is alert and oriented to person, place, and time.  Psychiatric:        Mood and Affect: Mood normal.        Behavior: Behavior normal.   Breast: No palpable masses.  No palpable adenopathy.  LABS:   CBC    Component Value Date/Time   WBC 9.0 08/26/2023 0855   RBC 4.18 08/26/2023 0855   HGB 11.5 (L) 08/26/2023 0855   HGB 10.3 (L) 04/25/2023 1648   HCT 37.4 08/26/2023 0855   HCT 33.0 (L) 04/25/2023 1648   PLT 356 08/26/2023 0855   PLT 395 04/25/2023 1648   MCV 89.5 08/26/2023 0855   MCV 92 04/25/2023 1648   MCH 27.5 08/26/2023 0855   MCHC 30.7 08/26/2023 0855   RDW 19.5 (H) 08/26/2023 0855   RDW 18.1 (H) 04/25/2023 1648   LYMPHSABS 1.6 08/26/2023 0855   LYMPHSABS 1.6 10/11/2022 1109   MONOABS 0.9 08/26/2023 0855   EOSABS 0.2 08/26/2023 0855   EOSABS 0.3 10/11/2022 1109   BASOSABS 0.0 08/26/2023 0855   BASOSABS 0.1 10/11/2022 1109    CMP    Component Value Date/Time   NA 136 08/26/2023 0855   NA 142 08/05/2023 0909   K 3.8 08/26/2023 0855   CL 101 08/26/2023 0855   CO2 23 08/26/2023 0855   GLUCOSE 112 (H) 08/26/2023 0855   BUN 21 08/26/2023 0855   BUN 14 08/05/2023 0909   CREATININE 1.91 (H) 08/26/2023 0855   CREATININE 1.53 (H) 11/18/2019 0910   CALCIUM 10.3 08/26/2023 0855   CALCIUM 10.5 11/28/2010 1057   PROT 9.1 (H) 08/26/2023 0855   PROT 8.0 08/05/2023 0909   ALBUMIN 4.1 08/26/2023 0855   ALBUMIN 4.3 08/05/2023 0909   AST 17 08/26/2023 0855   ALT 13 08/26/2023 0855   ALKPHOS 77 08/26/2023 0855   BILITOT 0.4 08/26/2023 0855    BILITOT 0.2 08/05/2023 0909   GFRNONAA 26 (L) 08/26/2023 0855   GFRNONAA 33 (L) 11/18/2019 0910   GFRAA 41 (L) 05/13/2020 0946   GFRAA 38 (L) 11/18/2019 0910     No results found for: "CEA1", "CEA" / No results found for: "CEA1", "CEA" No results found for: "PSA1" No results found for: "CAN199" No results found for: "CAN125"  Lab Results  Component Value Date   TOTALPROTELP 8.3 06/13/2022   ALBUMINELP 4.3 06/13/2022   A1GS 0.2 06/13/2022   A2GS 0.9 06/13/2022   BETS 1.0 06/13/2022   BETA2SER 6.9 (H) 11/22/2010   GAMS 1.9 (H) 06/13/2022   MSPIKE 1.3 (H) 06/13/2022   SPEI Comment 06/13/2022   Lab Results  Component Value Date   TIBC 514 (H) 08/26/2023   TIBC 465 (H) 06/13/2022   TIBC 351 03/06/2022  FERRITIN 5 (L) 08/26/2023   FERRITIN 12 06/13/2022   FERRITIN 103 03/06/2022   IRONPCTSAT 12 08/26/2023   IRONPCTSAT 11 06/13/2022   IRONPCTSAT 21 03/06/2022   Lab Results  Component Value Date   LDH 101 06/13/2022   LDH 102 03/06/2022   LDH 123 11/29/2021     STUDIES:   [place] RT BREAST BX W LOC DEV 1ST LESION IMG BX SPEC US GUIDE Addendum Date: 08/21/2023 ADDENDUM REPORT: 08/21/2023 15:11 ADDENDUM: PATHOLOGY revealed: A. 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 Overall Grade: 2. Lymphovascular invasion: Not identified. Cancer Length: 0.3 cm. Calcifications: Identified. Pathology results are CONCORDANT with imaging findings, per Dr. Edwin Cap. Pathology results and recommendations were discussed with patient via telephone on 08/21/2023 by Randa Lynn RN. Patient reported biopsy site doing well with no adverse symptoms, and only slight tenderness at the site. Post biopsy care instructions were reviewed, questions were answered and my direct phone number was provided. Patient was instructed to call [facility] Mammography  Department for any additional questions or concerns related to biopsy site. RECOMMENDATION: Surgical and oncological consultation. Request for surgical and oncological consultation was relayed to Ellin Mayhew RT at [facility] Mammography Department by Randa Lynn RN on 08/21/2023. Pathology results reported by Randa Lynn RN on 08/21/2023. Electronically Signed   By: Edwin Cap M.D.   On: 08/21/2023 15:11   Result Date: 08/21/2023 CLINICAL DATA:  Patient presents for ultrasound-guided core biopsy of a 0.3 cm mass in the right breast at 9 o'clock 6 cm from nipple. EXAM: ULTRASOUND GUIDED RIGHT BREAST CORE NEEDLE BIOPSY COMPARISON:  Previous exam(s). PROCEDURE: I met with the patient and we discussed the procedure of ultrasound-guided biopsy, including benefits and alternatives. We discussed the high likelihood of a successful procedure. We discussed the risks of the procedure, including infection, bleeding, tissue injury, clip migration, and inadequate sampling. Informed written consent was given. The usual time-out protocol was performed immediately prior to the procedure. Lesion quadrant: Upper outer Using sterile technique and 1% Lidocaine as local anesthetic, under direct ultrasound visualization, a 14 gauge spring-loaded device was used to perform biopsy of the mass in the right breast at the 9 o'clock position using a lateral to medial approach. At the conclusion of the procedure a ribbon shaped tissue marker clip was deployed into the biopsy cavity. Follow up 2 view mammogram was performed and dictated separately. IMPRESSION: Ultrasound guided biopsy of the mass in the right breast at the 9 o'clock position. No apparent complications. Electronically Signed: By: Edwin Cap M.D. On: 08/20/2023 13:42   MM CLIP PLACEMENT RIGHT Result Date: 08/20/2023 CLINICAL DATA:  Post ultrasound-guided core biopsy of a 0.3 cm mass in the right breast at 9 o'clock 8 cm from nipple. EXAM: 3D DIAGNOSTIC  RIGHT MAMMOGRAM POST ULTRASOUND BIOPSY COMPARISON:  Previous exam(s). ACR Breast Density Category b: There are scattered areas of fibroglandular density. FINDINGS: 3D Mammographic images were obtained following ultrasound-guided core biopsy of a mass in the right breast at the 9 o'clock position. A ribbon shaped biopsy marking clip is present at the site of the biopsied mass in the right breast at the 9 o'clock position. IMPRESSION: Ribbon shaped biopsy marking clip at site of biopsied mass in the right breast at the 9 o'clock position. Final Assessment: Post Procedure Mammograms for Marker Placement Electronically Signed   By: Edwin Cap  M.D.   On: 08/20/2023 13:45   MM 3D DIAGNOSTIC MAMMOGRAM UNILATERAL RIGHT BREAST Result Date: 08/06/2023 CLINICAL DATA:  Screening recall for right breast mass. EXAM: DIGITAL DIAGNOSTIC UNILATERAL RIGHT MAMMOGRAM WITH TOMOSYNTHESIS AND CAD TECHNIQUE: Right digital diagnostic mammography and breast tomosynthesis was performed. The images were evaluated with computer-aided detection. COMPARISON:  Previous exam(s). ACR Breast Density Category b: There are scattered areas of fibroglandular density. FINDINGS: Additional tomosynthesis images were performed of the right breast. There is an oval mass in the outer right breast measuring 0.3 cm. Targeted ultrasound of the right breast was performed. There is an oval hypoechoic mass with slight margin irregularity in the right breast at 9 o'clock 8 cm from nipple measuring 0.3 x 0.3 x 0.3 cm. This corresponds well with the mass seen in the upper-outer right breast at mammography. Normal lymph nodes are present in the right axilla. IMPRESSION: Indeterminate 0.3 cm mass in the right breast at the 9 o'clock position. RECOMMENDATION: Recommend ultrasound-guided core biopsy of the mass in the right breast at the 9 o'clock position. I have discussed the findings and recommendations with the patient. If applicable, a reminder letter will be  sent to the patient regarding the next appointment. BI-RADS CATEGORY  4: Suspicious. Electronically Signed   By: Edwin Cap M.D.   On: 08/06/2023 11:43   [place] LIMITED ULTRASOUND INCLUDING AXILLA RIGHT BREAST Result Date: 08/06/2023 CLINICAL DATA:  Screening recall for right breast mass. EXAM: DIGITAL DIAGNOSTIC UNILATERAL RIGHT MAMMOGRAM WITH TOMOSYNTHESIS AND CAD TECHNIQUE: Right digital diagnostic mammography and breast tomosynthesis was performed. The images were evaluated with computer-aided detection. COMPARISON:  Previous exam(s). ACR Breast Density Category b: There are scattered areas of fibroglandular density. FINDINGS: Additional tomosynthesis images were performed of the right breast. There is an oval mass in the outer right breast measuring 0.3 cm. Targeted ultrasound of the right breast was performed. There is an oval hypoechoic mass with slight margin irregularity in the right breast at 9 o'clock 8 cm from nipple measuring 0.3 x 0.3 x 0.3 cm. This corresponds well with the mass seen in the upper-outer right breast at mammography. Normal lymph nodes are present in the right axilla. IMPRESSION: Indeterminate 0.3 cm mass in the right breast at the 9 o'clock position. RECOMMENDATION: Recommend ultrasound-guided core biopsy of the mass in the right breast at the 9 o'clock position. I have discussed the findings and recommendations with the patient. If applicable, a reminder letter will be sent to the patient regarding the next appointment. BI-RADS CATEGORY  4: Suspicious. Electronically Signed   By: Edwin Cap M.D.   On: 08/06/2023 11:43   US RENAL Result Date: 07/30/2023 : PROCEDURE: US RENAL HISTORY: Patient is a 78 y/o  F with nephrolithiasis. COMPARISON: CT renal 06/19/2023, U/S renal 02/04/2023. TECHNIQUE: Two-dimensional grayscale and color Doppler ultrasound of the kidneys was performed. FINDINGS: The urinary bladder demonstrates normal anechoic echogenicity. The bilateral ureteral  jets are visualized. The right kidney measures 10.5 x 6.4 x 4.5 cm. Renal cortical echotexture is increased. There is no hydronephrosis. There are no stones. There is a simple cyst measuring 2.2 cm at the superior pole. The left kidney measures 11.2 x 5.8 x 4.8 cm. Renal cortical echotexture is increased. There is mild caliectasis. There is a possible stone measuring 0.9 cm at the midpole. There is a simple cyst measuring 0.8 cm at the midpole. IMPRESSION: 1. Increased echogenicity of the bilateral renal cortices with multiple cysts, consistent with medical renal disease. 2.  Possible  left renal calculus and mild caliectasis. Thank you for allowing [place] to assist in the care of this patient. Electronically Signed   By: Lestine Box M.D.   On: 07/30/2023 23:24

## 2023-08-26 ENCOUNTER — Ambulatory Visit (HOSPITAL_COMMUNITY)
Admission: RE | Admit: 2023-08-26 | Discharge: 2023-08-26 | Disposition: A | Discharge status: Home / Self Care | Source: Ambulatory Visit | Attending: Hematology | Admitting: Hematology

## 2023-08-26 ENCOUNTER — Inpatient Hospital Stay: Attending: Hematology | Admitting: Hematology

## 2023-08-26 ENCOUNTER — Inpatient Hospital Stay

## 2023-08-26 VITALS — BP 150/82 | HR 89 | Temp 98.3°F | Resp 18 | Ht 66.0 in | Wt 174.9 lb

## 2023-08-26 DIAGNOSIS — G8929 Other chronic pain: Secondary | ICD-10-CM | POA: Diagnosis not present

## 2023-08-26 DIAGNOSIS — C50411 Malignant neoplasm of upper-outer quadrant of right female breast: Secondary | ICD-10-CM | POA: Diagnosis present

## 2023-08-26 DIAGNOSIS — Z8673 Personal history of transient ischemic attack (TIA), and cerebral infarction without residual deficits: Secondary | ICD-10-CM | POA: Diagnosis not present

## 2023-08-26 DIAGNOSIS — Z7982 Long term (current) use of aspirin: Secondary | ICD-10-CM | POA: Insufficient documentation

## 2023-08-26 DIAGNOSIS — I129 Hypertensive chronic kidney disease with stage 1 through stage 4 chronic kidney disease, or unspecified chronic kidney disease: Secondary | ICD-10-CM | POA: Diagnosis not present

## 2023-08-26 DIAGNOSIS — Z87442 Personal history of urinary calculi: Secondary | ICD-10-CM | POA: Diagnosis not present

## 2023-08-26 DIAGNOSIS — Z7951 Long term (current) use of inhaled steroids: Secondary | ICD-10-CM | POA: Diagnosis not present

## 2023-08-26 DIAGNOSIS — G4733 Obstructive sleep apnea (adult) (pediatric): Secondary | ICD-10-CM | POA: Insufficient documentation

## 2023-08-26 DIAGNOSIS — E1140 Type 2 diabetes mellitus with diabetic neuropathy, unspecified: Secondary | ICD-10-CM | POA: Insufficient documentation

## 2023-08-26 DIAGNOSIS — N183 Chronic kidney disease, stage 3 unspecified: Secondary | ICD-10-CM | POA: Insufficient documentation

## 2023-08-26 DIAGNOSIS — E785 Hyperlipidemia, unspecified: Secondary | ICD-10-CM | POA: Diagnosis not present

## 2023-08-26 DIAGNOSIS — Z7984 Long term (current) use of oral hypoglycemic drugs: Secondary | ICD-10-CM | POA: Insufficient documentation

## 2023-08-26 DIAGNOSIS — M170 Bilateral primary osteoarthritis of knee: Secondary | ICD-10-CM | POA: Diagnosis not present

## 2023-08-26 DIAGNOSIS — D509 Iron deficiency anemia, unspecified: Secondary | ICD-10-CM | POA: Diagnosis not present

## 2023-08-26 DIAGNOSIS — Z170 Estrogen receptor positive status [ER+]: Secondary | ICD-10-CM | POA: Insufficient documentation

## 2023-08-26 DIAGNOSIS — Z7985 Long-term (current) use of injectable non-insulin antidiabetic drugs: Secondary | ICD-10-CM | POA: Diagnosis not present

## 2023-08-26 DIAGNOSIS — Z79899 Other long term (current) drug therapy: Secondary | ICD-10-CM | POA: Insufficient documentation

## 2023-08-26 DIAGNOSIS — Z87891 Personal history of nicotine dependence: Secondary | ICD-10-CM | POA: Diagnosis not present

## 2023-08-26 DIAGNOSIS — C9000 Multiple myeloma not having achieved remission: Secondary | ICD-10-CM | POA: Insufficient documentation

## 2023-08-26 DIAGNOSIS — Z860101 Personal history of adenomatous and serrated colon polyps: Secondary | ICD-10-CM | POA: Insufficient documentation

## 2023-08-26 DIAGNOSIS — E1122 Type 2 diabetes mellitus with diabetic chronic kidney disease: Secondary | ICD-10-CM | POA: Insufficient documentation

## 2023-08-26 LAB — COMPREHENSIVE METABOLIC PANEL
ALT: 13 U/L (ref 0–44)
AST: 17 U/L (ref 15–41)
Albumin: 4.1 g/dL (ref 3.5–5.0)
Alkaline Phosphatase: 77 U/L (ref 38–126)
Anion gap: 12 (ref 5–15)
BUN: 21 mg/dL (ref 8–23)
CO2: 23 mmol/L (ref 22–32)
Calcium: 10.3 mg/dL (ref 8.9–10.3)
Chloride: 101 mmol/L (ref 98–111)
Creatinine, Ser: 1.91 mg/dL — ABNORMAL HIGH (ref 0.44–1.00)
GFR, Estimated: 26 mL/min — ABNORMAL LOW (ref >60)
Glucose, Bld: 112 mg/dL — ABNORMAL HIGH (ref 70–99)
Potassium: 3.8 mmol/L (ref 3.5–5.1)
Sodium: 136 mmol/L (ref 135–145)
Total Bilirubin: 0.4 mg/dL (ref 0.0–1.2)
Total Protein: 9.1 g/dL — ABNORMAL HIGH (ref 6.5–8.1)

## 2023-08-26 LAB — CBC WITH DIFFERENTIAL/PLATELET
Abs Immature Granulocytes: 0.03 10*3/uL (ref 0.00–0.07)
Basophils Absolute: 0 10*3/uL (ref 0.0–0.1)
Basophils Relative: 0 %
Eosinophils Absolute: 0.2 10*3/uL (ref 0.0–0.5)
Eosinophils Relative: 2 %
HCT: 37.4 % (ref 36.0–46.0)
Hemoglobin: 11.5 g/dL — ABNORMAL LOW (ref 12.0–15.0)
Immature Granulocytes: 0 %
Lymphocytes Relative: 18 %
Lymphs Abs: 1.6 10*3/uL (ref 0.7–4.0)
MCH: 27.5 pg (ref 26.0–34.0)
MCHC: 30.7 g/dL (ref 30.0–36.0)
MCV: 89.5 fL (ref 80.0–100.0)
Monocytes Absolute: 0.9 10*3/uL (ref 0.1–1.0)
Monocytes Relative: 10 %
Neutro Abs: 6.3 10*3/uL (ref 1.7–7.7)
Neutrophils Relative %: 70 %
Platelets: 356 10*3/uL (ref 150–400)
RBC: 4.18 MIL/uL (ref 3.87–5.11)
RDW: 19.5 % — ABNORMAL HIGH (ref 11.5–15.5)
WBC: 9 10*3/uL (ref 4.0–10.5)
nRBC: 0 % (ref 0.0–0.2)

## 2023-08-26 LAB — VITAMIN B12: Vitamin B-12: 528 pg/mL (ref 180–914)

## 2023-08-26 LAB — FERRITIN: Ferritin: 5 ng/mL — ABNORMAL LOW (ref 11–307)

## 2023-08-26 LAB — IRON AND TIBC
Iron: 61 ug/dL (ref 28–170)
Saturation Ratios: 12 % (ref 10.4–31.8)
TIBC: 514 ug/dL — ABNORMAL HIGH (ref 250–450)
UIBC: 453 ug/dL

## 2023-08-26 LAB — FOLATE: Folate: 12.7 ng/mL (ref >5.9)

## 2023-08-26 NOTE — Patient Instructions (Addendum)
 [facility]  Discharge Instructions  You were seen and examined today by Dr. Ellin Saba. Dr. Ellin Saba is a medical oncologist, meaning that he specializes in the treatment of cancer diagnoses. Dr. Ellin Saba discussed your past medical history, family history of cancers, and the events that led to you being here today.  You were referred back to Dr. 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 This is typically treated with surgery followed by anti-estrogen pills to be taken once daily for at least 5 years.   Your cancer, as noted on the mammogram, is quite small. There was no spread of the cancer to the lymph nodes. Please proceed with your appointment with the surgeon as scheduled so that you may proceed with surgery.  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 Ellin Saba has recommended additional labs today as well as a skeletal survey.  In preparation for anti-estrogen therapy, Dr. Ellin Saba has recommended a bone density scan.  Dr. Ellin Saba will see you about one month after surgery.  Thank you for choosing  [facility] to provide your oncology and hematology care.   To afford each patient quality time with our provider, please arrive at least 15 minutes before your scheduled appointment time. You may need to reschedule your appointment if you arrive late (10 or more minutes). Arriving late affects you and other patients whose appointments are after yours.  Also, if you miss three or more appointments without notifying the office, you may be dismissed from the clinic at the provider's discretion.    Again, thank you for choosing [facility].  Our hope is that these requests will decrease the amount of time that you wait before being seen by our physicians.   If you have a  lab appointment with the Cancer Center - please note that after April 8th, all labs will be drawn in the cancer center.  You do not have to check in or register with the main entrance as you have in the past but will complete your check-in at the cancer center.            _____________________________________________________________  Should you have questions after your visit to [facility], please contact our office at [phone] and follow the prompts.  Our office hours are 8:00 a.m. to 4:30 p.m. Monday - Thursday and 8:00 a.m. to 2:30 p.m. Friday.  Please note that voicemails left after 4:00 p.m. may not be returned until the following business day.  We are closed weekends and all major holidays.  You do have access to a nurse 24-7, just call the main number to the clinic [phone] and do not press any options, hold on the line and a nurse will answer the phone.    For prescription refill requests, have your pharmacy contact our office and allow 72 hours.    Masks are no longer required in the cancer centers. If you would like for your care team to wear a mask while they are taking care of you, please let them know. You may have one support person who is at least 79 years old accompany you for your appointments.

## 2023-08-27 LAB — KAPPA/LAMBDA LIGHT CHAINS
Kappa free light chain: 42.5 mg/L — ABNORMAL HIGH (ref 3.3–19.4)
Kappa, lambda light chain ratio: 2.28 — ABNORMAL HIGH (ref 0.26–1.65)
Lambda free light chains: 18.6 mg/L (ref 5.7–26.3)

## 2023-08-28 LAB — PROTEIN ELECTROPHORESIS, SERUM
A/G Ratio: 0.9 (ref 0.7–1.7)
Albumin ELP: 4 g/dL (ref 2.9–4.4)
Alpha-1-Globulin: 0.2 g/dL (ref 0.0–0.4)
Alpha-2-Globulin: 1.1 g/dL — ABNORMAL HIGH (ref 0.4–1.0)
Beta Globulin: 1.3 g/dL (ref 0.7–1.3)
Gamma Globulin: 2.1 g/dL — ABNORMAL HIGH (ref 0.4–1.8)
Globulin, Total: 4.7 g/dL — ABNORMAL HIGH (ref 2.2–3.9)
M-Spike, %: 1.6 g/dL — ABNORMAL HIGH
Total Protein ELP: 8.7 g/dL — ABNORMAL HIGH (ref 6.0–8.5)

## 2023-08-29 ENCOUNTER — Other Ambulatory Visit (HOSPITAL_COMMUNITY): Payer: Self-pay | Admitting: General Surgery

## 2023-08-29 ENCOUNTER — Ambulatory Visit (INDEPENDENT_AMBULATORY_CARE_PROVIDER_SITE_OTHER): Admitting: General Surgery

## 2023-08-29 ENCOUNTER — Encounter: Payer: Self-pay | Admitting: General Surgery

## 2023-08-29 VITALS — BP 151/80 | HR 94 | Temp 98.2°F | Resp 16 | Ht 66.0 in | Wt 181.0 lb

## 2023-08-29 DIAGNOSIS — C50911 Malignant neoplasm of unspecified site of right female breast: Secondary | ICD-10-CM

## 2023-08-29 DIAGNOSIS — C50919 Malignant neoplasm of unspecified site of unspecified female breast: Secondary | ICD-10-CM | POA: Diagnosis not present

## 2023-08-29 DIAGNOSIS — Z170 Estrogen receptor positive status [ER+]: Secondary | ICD-10-CM | POA: Diagnosis not present

## 2023-08-30 NOTE — Progress Notes (Signed)
 Cheryl Abbott; [id number]; 03/14/1946   HPI Patient is a 78 year old black female who was referred to my care by Syliva Overman for evaluation treatment of a right breast cancer.  This was found on routine mammography.  Patient denies feeling a lump.  She has had no nipple discharge.  A core biopsy of the mass in the right breast reveals invasive mammary carcinoma.  The mass measured only 0.3 cm in size.  Patient has been seen by Dr. Ellin Saba of oncology. Past Medical History:  Diagnosis Date   Anxiety disorder    Arthritis    Chronic back pain    Chronic pain of both shoulders    per pt told due to cervical spine pinched nerve   CKD (chronic kidney disease), stage III [facility])    nephrologist--- dr Wolfgang Phoenix;   due to tubular necrosis   Claustrophobia    Depression    Edema of both lower extremities    GERD (gastroesophageal reflux disease)    Headache    Hemorrhoids    w/ intermittant bleeding   History of adenomatous polyp of colon    History of kidney stones    Hx-TIA (transient ischemic attack) 02/26/2016   per pt no residual   Hydronephrosis, left    followed by urologist--- dr Retta Diones;   persistant   Hyperlipidemia    Hypertension    Iron deficiency    followed by [facility] w/ hx iron infusions   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   oncologist--- [facility];  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, active survillence   Lactose intolerance in adult 02/01/2016   Left ureteral calculus    Lumbar disc disease with radiculopathy    Nephrolithiasis    per CT 08-11-2021  left > right   Neuropathy, peripheral    Nocturia more than twice per night    Normocytic anemia    OSA (obstructive sleep apnea)    per study 2007;   per pt no Bipap use since 2021   Renal atrophy, bilateral    Renal cyst, right    Sciatica    Sigmoid diverticulosis    Type 2 diabetes mellitus (HCC)     Past Surgical History:  Procedure Laterality Date   BIOPSY N/A  03/17/2013   Procedure: GASTRIC BIOPSIES;  Surgeon: West Bali, MD;  Location: [facility];  Service: Endoscopy;  Laterality: N/A;   BIOPSY  06/10/2015   Procedure: BIOPSY;  Surgeon: West Bali, MD;  [facility];  Service: Endoscopy;;  gastric biopsy   BREAST BIOPSY Right 08/20/2023   path pending   BREAST BIOPSY Right 08/20/2023   [place] RT BREAST BX W LOC DEV 1ST LESION IMG BX SPEC US GUIDE 08/20/2023 Edwin Cap, MD [facility]   CARDIAC CATHETERIZATION  05/11/2003   @ [facility] by dr t. Tresa Endo;  Abnormal cardiolite/   Normal coronary arteries and normal LVF,  ef  63%   CARDIOVASCULAR STRESS TEST  01/01/2014   normal lexiscan cardiolite/  no ischemia/ infarct/  normal LV function and wall motion ,  ef 81%   COLONOSCOPY WITH PROPOFOL N/A 02/26/2017   Procedure: COLONOSCOPY WITH PROPOFOL;  Surgeon: West Bali, MD;  [facility];  Service: Endoscopy;  Laterality: N/A;  11:00am   COLONOSCOPY WITH PROPOFOL N/A 09/22/2019   Procedure: COLONOSCOPY WITH PROPOFOL;  Surgeon: West Bali, MD;  [facility];  Service: Endoscopy;  Laterality: N/A;  1:15   CYSTO/   RIGHT URETEROSOCOPY LASER LITHOTRIPSY STONE EXTRACTION  10/13/2004   CYSTO/  RIGHT RETROGRADE PYELOGRAM/  PLACMENT RIGHT URETERAL STENT  01/10/2010   CYSTOSCOPY W/ URETERAL STENT PLACEMENT Right 08/19/2015   Procedure: CYSTOSCOPY WITH RETROGRADE PYELOGRAM/URETERAL STENT PLACEMENT;  Surgeon: Marcine Matar, MD;  Location: [facility];  Service: Urology;  Laterality: Right;   CYSTOSCOPY W/ URETERAL STENT PLACEMENT Left 02/23/2020   Procedure: CYSTOSCOPY LEFT  RETROGRADE PYELOGRAM LEFT URETEROSCOPY URETERAL STENT PLACEMENT;  Surgeon: Marcine Matar, MD;  Location: [facility];  Service: Urology;  Laterality: Left;   CYSTOSCOPY W/ URETERAL STENT PLACEMENT Right 09/24/2022   Procedure: CYSTOSCOPY WITH RETROGRADE PYELOGRAM/URETERAL STENT PLACEMENT;  Surgeon: Malen Gauze, MD;   Location: [facility];  Service: Urology;  Laterality: Right;   CYSTOSCOPY W/ URETERAL STENT PLACEMENT Right 10/25/2022   Procedure: RIGHT URETERAL STENT EXCHANGE;  Surgeon: Malen Gauze, MD;  [facility]: [facility];  Service: Urology;  Laterality: Right;   CYSTOSCOPY WITH RETROGRADE PYELOGRAM, URETEROSCOPY AND STENT PLACEMENT Left 10/21/2014   Procedure: CYSTOSCOPY WITH RETROGRADE PYELOGRAM, URETEROSCOPY AND STENT PLACEMENT;  Surgeon: Marcine Matar, MD;  Location: [facility];  Service: Urology;  Laterality: Left;   CYSTOSCOPY WITH RETROGRADE PYELOGRAM, URETEROSCOPY AND STENT PLACEMENT Right 11/22/2015   Procedure: CYSTOSCOPY, RIGHT URETERAL STENT REMOVAL; RIGHT RETROGRADE PYELOGRAM, RIGHT URETEROSCOPY WITH BALLOON DILATION; RIGHT URETERAL STENT PLACEMENT;  Surgeon: Marcine Matar, MD;  [facility];  Service: Urology;  Laterality: Right;   CYSTOSCOPY WITH RETROGRADE PYELOGRAM, URETEROSCOPY AND STENT PLACEMENT Left 10/18/2020   Procedure: CYSTOSCOPY WITH LEFT RETROGRADE PYELOGRAM, LEFT URETEROSCOPY  WITH LASER AND LEFT URETERAL STENT PLACEMENT;  Surgeon: Marcine Matar, MD;  [facility];  Service: Urology;  Laterality: Left;   CYSTOSCOPY WITH RETROGRADE PYELOGRAM, URETEROSCOPY AND STENT PLACEMENT Left 10/26/2021   Procedure: CYSTOSCOPY WITH RETROGRADE PYELOGRAM, URETEROSCOPY, STONE EXTRACTION  AND STENT PLACEMENT;  Surgeon: Marcine Matar, MD;  Location: [facility];  Service: Urology;  Laterality: Left;   CYSTOSCOPY WITH RETROGRADE PYELOGRAM, URETEROSCOPY AND STENT PLACEMENT Bilateral 10/25/2022   Procedure: CYSTOSCOPY WITH RETROGRADE PYELOGRAM, URETEROSCOPY;  Surgeon: Malen Gauze, MD;  [facility];  Service: Urology;  Laterality: Bilateral;   CYSTOSCOPY/RETROGRADE/URETEROSCOPY/STONE EXTRACTION WITH BASKET Bilateral 08/02/2015   Procedure: CYSTOSCOPY; BILATERAL RETROGRADE PYELOGRAMS; BILATERAL URETEROSCOPY, STONE EXTRACTION WITH BASKET;   Surgeon: Marcine Matar, MD;  [facility];  Service: Urology;  Laterality: Bilateral;   CYSTOSCOPY/RETROGRADE/URETEROSCOPY/STONE EXTRACTION WITH BASKET Right 06/28/2015   Procedure: CYSTOSCOPY/RETROGRADE/URETEROSCOPY/STONE EXTRACTION WITH BASKET, RIGHT URETERAL DOUBLE J STENT PLACEMENT;  Surgeon: Marcine Matar, MD;  [facility];  Service: Urology;  Laterality: Right;   ESOPHAGOGASTRODUODENOSCOPY (EGD) WITH PROPOFOL N/A 03/17/2013   Procedure: ESOPHAGOGASTRODUODENOSCOPY (EGD) WITH PROPOFOL;  Surgeon: West Bali, MD;  [facility]: [facility];  Service: Endoscopy;  Laterality: N/A;   ESOPHAGOGASTRODUODENOSCOPY (EGD) WITH PROPOFOL N/A 06/10/2015   Distal gastritis, distal esophageal stricture s/p dilation   ESOPHAGOGASTRODUODENOSCOPY (EGD) WITH PROPOFOL N/A 02/26/2017   Procedure: ESOPHAGOGASTRODUODENOSCOPY (EGD) WITH PROPOFOL;  Surgeon: West Bali, MD;  [facility];  Service: Endoscopy;  Laterality: N/A;   FLEXIBLE SIGMOIDOSCOPY  09/11/2011   [id number] Internal hemorrhoids   HOLMIUM LASER APPLICATION Left 10/21/2014   Procedure: HOLMIUM LASER APPLICATION;  Surgeon: Marcine Matar, MD;  Location: [facility];  Service: Urology;  Laterality: Left;   HOLMIUM LASER APPLICATION Right 06/28/2015   Procedure: HOLMIUM LASER APPLICATION;  Surgeon: Marcine Matar, MD;  [facility]: [facility];  Service: Urology;  Laterality: Right;  HOLMIUM LASER APPLICATION  02/23/2020   Procedure: HOLMIUM LASER APPLICATION;  Surgeon: Marcine Matar, MD;  [facility];  Service: Urology;;   HOLMIUM LASER APPLICATION Left 10/18/2020   Procedure: HOLMIUM LASER APPLICATION;  Surgeon: Marcine Matar, MD;  [facility]: [facility];  Service: Urology;  Laterality: Left;   HOLMIUM LASER APPLICATION Right 10/25/2022   Procedure: HOLMIUM LASER APPLICATION;  Surgeon: Malen Gauze, MD;  Location: [facility];  Service: Urology;  Laterality: Right;   MASS EXCISION Left 03/04/2015    Procedure: EXCISION OF SOFT TISSUE NEOPLASM LEFT ARM;  Surgeon: Franky Macho, MD;  Location: [facility];  Service: General;  Laterality: Left;   PERCUTANEOUS NEPHROSTOLITHOTOMY Bilateral 05/2010   @[facility]    POLYPECTOMY N/A 03/17/2013   Procedure: GASTRIC POLYPECTOMY;  Surgeon: West Bali, MD;  [facility];  Service: Endoscopy;  Laterality: N/A;   POLYPECTOMY  02/26/2017   Procedure: POLYPECTOMY;  Surgeon: West Bali, MD;  [facility];  Service: Endoscopy;;  polyp at cecum, ascending colon polyps x3, hepatic flexure polyps x8, transverse colon polyps x8    POLYPECTOMY  09/22/2019   Procedure: POLYPECTOMY;  Surgeon: West Bali, MD;  [facility];  Service: Endoscopy;;   REMOVAL RIGHT THIGH CYST  2006   SAVORY DILATION N/A 03/17/2013   Procedure: SAVORY DILATION;  Surgeon: West Bali, MD;  Location: [facility];  Service: Endoscopy;  Laterality: N/A;  #12.8, 14, 15, 16 dilators used   SAVORY DILATION N/A 06/10/2015   Procedure: SAVORY DILATION;  Surgeon: West Bali, MD;  [facility];  Service: Endoscopy;  Laterality: N/A;   VAGINAL HYSTERECTOMY  1970's    Family History  Problem Relation Age of Onset   Hypertension Mother    Diabetes Mother    Heart failure Mother    Dementia Mother    Emphysema Father    Hypertension Father    Diabetes Brother    GER disease Brother    Hypertension Sister    Hypertension Sister    Cancer Other        family history    Diabetes Other        family history    Heart defect Other        famiily history    Arthritis Other        family history    Anesthesia problems Neg Hx    Hypotension Neg Hx    Malignant hyperthermia Neg Hx    Pseudochol deficiency Neg Hx    Colon cancer Neg Hx     Current Outpatient Medications on File Prior to Visit  Medication Sig Dispense Refill   acetaminophen (TYLENOL) 500 MG tablet Take 2 tablets (1,000 mg total) by mouth at bedtime.     amLODipine (NORVASC) 10  MG tablet TAKE 1 TABLET BY MOUTH EVERY DAY 90 tablet 1   aspirin EC 81 MG tablet Take 81 mg by mouth daily. Swallow whole.     diclofenac Sodium (VOLTAREN) 1 % GEL APPLY 4 G TOPICALLY 4 (FOUR) TIMES DAILY AS NEEDED (PAIN). 400 g 5   diphenhydramine-acetaminophen (TYLENOL PM) 25-500 MG TABS tablet Take 2 tablets by mouth at bedtime.     Dulaglutide (TRULICITY) 0.75 MG/0.5ML SOAJ INJECT 0.75 MG INTO THE SKIN WEEKLY 2 mL 1   ezetimibe (ZETIA) 10 MG tablet Take 1 tablet (10 mg total) by mouth daily. 90 tablet 1   folic acid (FOLVITE) 1 MG tablet Take 1 tablet (1 mg total)  by mouth daily.     gabapentin (NEURONTIN) 300 MG capsule TAKE 1 CAPSULE BY MOUTH THREE TIMES A DAY AS NEEDED 90 capsule 5   glucose blood (ONETOUCH ULTRA) test strip USE AS DIRECTED 100 strip 5   linagliptin (TRADJENTA) 5 MG TABS tablet Take 1 tablet (5 mg total) by mouth daily.     Magnesium 400 MG CAPS Take 1 tablet by mouth daily. 30 capsule 11   mirabegron ER (MYRBETRIQ) 25 MG TB24 tablet Take 1 tablet (25 mg total) by mouth daily.     OneTouch Delica Lancets 30G MISC Once daily testing dx e11.9 100 each 5   pantoprazole (PROTONIX) 40 MG tablet TAKE 1 TABLET BY MOUTH TWICE A DAY 180 tablet 1   PARoxetine (PAXIL) 40 MG tablet Take 1 tablet (40 mg total) by mouth in the morning. 90 tablet 1   rosuvastatin (CRESTOR) 20 MG tablet TAKE 1 TABLET BY MOUTH EVERY DAY 90 tablet 3   sennosides-docusate sodium (SENOKOT-S) 8.6-50 MG tablet Take 1 tablet by mouth daily. 30 tablet 2   SYMBICORT 80-4.5 MCG/ACT inhaler INHALE 2 PUFFS INTO THE LUNGS TWICE A DAY 30.6 each 4   traMADol (ULTRAM) 50 MG tablet Take 1 tablet (50 mg total) by mouth every 6 (six) hours as needed. 60 tablet 0   UNABLE TO FIND Diabetic shoes x 1 pair, inserts x 3 pair  DX E11.9 1 each 0   vitamin B-12 (VITAMIN B12) 500 MCG tablet Take 1 tablet (500 mcg total) by mouth daily.     nitroGLYCERIN (NITROSTAT) 0.4 MG SL tablet PLACE 1 TABLET (0.4 MG TOTAL) UNDER THE TONGUE  EVERY 5 (FIVE) MINUTES AS NEEDED FOR CHEST PAIN. UP TO 3 TABLETS, THEN CALL 911. 25 tablet 3   No current facility-administered medications on file prior to visit.    Allergies  Allergen Reactions   Ace Inhibitors Cough    Patient doesn't recall  Other Reaction(s): GI Intolerance   Cephalexin Diarrhea and Nausea And Vomiting    Other Reaction(s): GI Intolerance   Nitrofurantoin Diarrhea and Nausea And Vomiting   Penicillins Hives and Other (See Comments)    Social History   Substance and Sexual Activity  Alcohol Use No   Alcohol/week: 0.0 standard drinks of alcohol    Social History   Tobacco Use  Smoking Status Former   Current packs/day: 0.00   Average packs/day: 1 pack/day for 20.0 years (20.0 ttl pk-yrs)   Types: Cigarettes   Start date: 09/21/1974   Quit date: 09/20/1988   Years since quitting: 34.9   Passive exposure: Past  Smokeless Tobacco Never    Review of Systems  Constitutional: Negative.   HENT: Negative.    Eyes: Negative.   Respiratory: Negative.    Cardiovascular: Negative.   Gastrointestinal: Negative.   Genitourinary: Negative.   Musculoskeletal: Negative.   Skin: Negative.   Neurological:  Positive for dizziness.  Endo/Heme/Allergies: Negative.   Psychiatric/Behavioral: Negative.      Objective   Vitals:   08/29/23 1039  BP: (!) 151/80  Pulse: 94  Resp: 16  Temp: 98.2 F (36.8 C)  SpO2: 94%    Physical Exam Vitals reviewed.  Constitutional:      Appearance: Normal appearance. She is not ill-appearing.  HENT:     Head: Normocephalic and atraumatic.  Cardiovascular:     Rate and Rhythm: Normal rate and regular rhythm.     Heart sounds: Normal heart sounds. No murmur heard.    No friction rub.  No gallop.  Pulmonary:     Effort: Pulmonary effort is normal. No respiratory distress.     Breath sounds: Normal breath sounds. No stridor. No wheezing, rhonchi or rales.  Musculoskeletal:     Cervical back: Neck supple.   Lymphadenopathy:     Cervical: No cervical adenopathy.  Skin:    General: Skin is warm and dry.  Neurological:     Mental Status: She is alert and oriented to person, place, and time.   Breast: No dominant mass, nipple discharge, or dimpling noted in either breast.  Both axillas are negative for palpable nodes.  Mammography and pathology reports reviewed Dr. Marice Potter note reviewed  Assessment  Invasive mammary carcinoma right breast, 0.3 cm in its greatest diameter.  Given the patient's age and size of the tumor, I do think she would benefit from excision of the tumor.  There is no need for sentinel lymph node biopsy at the present time. Plan  Patient is scheduled for a right partial mastectomy after radiofrequency tag placement on 09/11/2023.  The risks and benefits of the procedure including bleeding, infection, and the possibility of unclear margins were fully explained to the patient, who gave informed consent.  Tag placement has been scheduled for 09/10/2023.

## 2023-08-30 NOTE — H&P (Signed)
 Cheryl Abbott; [id number]; 03/14/1946   HPI Patient is a 78 year old black female who was referred to my care by Syliva Overman for evaluation treatment of a right breast cancer.  This was found on routine mammography.  Patient denies feeling a lump.  She has had no nipple discharge.  A core biopsy of the mass in the right breast reveals invasive mammary carcinoma.  The mass measured only 0.3 cm in size.  Patient has been seen by Dr. Ellin Saba of oncology. Past Medical History:  Diagnosis Date   Anxiety disorder    Arthritis    Chronic back pain    Chronic pain of both shoulders    per pt told due to cervical spine pinched nerve   CKD (chronic kidney disease), stage III [facility])    nephrologist--- dr Wolfgang Phoenix;   due to tubular necrosis   Claustrophobia    Depression    Edema of both lower extremities    GERD (gastroesophageal reflux disease)    Headache    Hemorrhoids    w/ intermittant bleeding   History of adenomatous polyp of colon    History of kidney stones    Hx-TIA (transient ischemic attack) 02/26/2016   per pt no residual   Hydronephrosis, left    followed by urologist--- dr Retta Diones;   persistant   Hyperlipidemia    Hypertension    Iron deficiency    followed by [facility] w/ hx iron infusions   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   oncologist--- [facility];  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, active survillence   Lactose intolerance in adult 02/01/2016   Left ureteral calculus    Lumbar disc disease with radiculopathy    Nephrolithiasis    per CT 08-11-2021  left > right   Neuropathy, peripheral    Nocturia more than twice per night    Normocytic anemia    OSA (obstructive sleep apnea)    per study 2007;   per pt no Bipap use since 2021   Renal atrophy, bilateral    Renal cyst, right    Sciatica    Sigmoid diverticulosis    Type 2 diabetes mellitus (HCC)     Past Surgical History:  Procedure Laterality Date   BIOPSY N/A  03/17/2013   Procedure: GASTRIC BIOPSIES;  Surgeon: West Bali, MD;  Location: [facility];  Service: Endoscopy;  Laterality: N/A;   BIOPSY  06/10/2015   Procedure: BIOPSY;  Surgeon: West Bali, MD;  [facility];  Service: Endoscopy;;  gastric biopsy   BREAST BIOPSY Right 08/20/2023   path pending   BREAST BIOPSY Right 08/20/2023   [place] RT BREAST BX W LOC DEV 1ST LESION IMG BX SPEC US GUIDE 08/20/2023 Edwin Cap, MD [facility]   CARDIAC CATHETERIZATION  05/11/2003   @ [facility] by dr t. Tresa Endo;  Abnormal cardiolite/   Normal coronary arteries and normal LVF,  ef  63%   CARDIOVASCULAR STRESS TEST  01/01/2014   normal lexiscan cardiolite/  no ischemia/ infarct/  normal LV function and wall motion ,  ef 81%   COLONOSCOPY WITH PROPOFOL N/A 02/26/2017   Procedure: COLONOSCOPY WITH PROPOFOL;  Surgeon: West Bali, MD;  [facility];  Service: Endoscopy;  Laterality: N/A;  11:00am   COLONOSCOPY WITH PROPOFOL N/A 09/22/2019   Procedure: COLONOSCOPY WITH PROPOFOL;  Surgeon: West Bali, MD;  [facility];  Service: Endoscopy;  Laterality: N/A;  1:15   CYSTO/   RIGHT URETEROSOCOPY LASER LITHOTRIPSY STONE EXTRACTION  10/13/2004   CYSTO/  RIGHT RETROGRADE PYELOGRAM/  PLACMENT RIGHT URETERAL STENT  01/10/2010   CYSTOSCOPY W/ URETERAL STENT PLACEMENT Right 08/19/2015   Procedure: CYSTOSCOPY WITH RETROGRADE PYELOGRAM/URETERAL STENT PLACEMENT;  Surgeon: Marcine Matar, MD;  Location: [facility];  Service: Urology;  Laterality: Right;   CYSTOSCOPY W/ URETERAL STENT PLACEMENT Left 02/23/2020   Procedure: CYSTOSCOPY LEFT  RETROGRADE PYELOGRAM LEFT URETEROSCOPY URETERAL STENT PLACEMENT;  Surgeon: Marcine Matar, MD;  Location: [facility];  Service: Urology;  Laterality: Left;   CYSTOSCOPY W/ URETERAL STENT PLACEMENT Right 09/24/2022   Procedure: CYSTOSCOPY WITH RETROGRADE PYELOGRAM/URETERAL STENT PLACEMENT;  Surgeon: Malen Gauze, MD;   Location: [facility];  Service: Urology;  Laterality: Right;   CYSTOSCOPY W/ URETERAL STENT PLACEMENT Right 10/25/2022   Procedure: RIGHT URETERAL STENT EXCHANGE;  Surgeon: Malen Gauze, MD;  Location: [facility];  Service: Urology;  Laterality: Right;   CYSTOSCOPY WITH RETROGRADE PYELOGRAM, URETEROSCOPY AND STENT PLACEMENT Left 10/21/2014   Procedure: CYSTOSCOPY WITH RETROGRADE PYELOGRAM, URETEROSCOPY AND STENT PLACEMENT;  Surgeon: Marcine Matar, MD;  Location: [facility];  Service: Urology;  Laterality: Left;   CYSTOSCOPY WITH RETROGRADE PYELOGRAM, URETEROSCOPY AND STENT PLACEMENT Right 11/22/2015   Procedure: CYSTOSCOPY, RIGHT URETERAL STENT REMOVAL; RIGHT RETROGRADE PYELOGRAM, RIGHT URETEROSCOPY WITH BALLOON DILATION; RIGHT URETERAL STENT PLACEMENT;  Surgeon: Marcine Matar, MD;  [facility];  Service: Urology;  Laterality: Right;   CYSTOSCOPY WITH RETROGRADE PYELOGRAM, URETEROSCOPY AND STENT PLACEMENT Left 10/18/2020   Procedure: CYSTOSCOPY WITH LEFT RETROGRADE PYELOGRAM, LEFT URETEROSCOPY  WITH LASER AND LEFT URETERAL STENT PLACEMENT;  Surgeon: Marcine Matar, MD;  [facility];  Service: Urology;  Laterality: Left;   CYSTOSCOPY WITH RETROGRADE PYELOGRAM, URETEROSCOPY AND STENT PLACEMENT Left 10/26/2021   Procedure: CYSTOSCOPY WITH RETROGRADE PYELOGRAM, URETEROSCOPY, STONE EXTRACTION  AND STENT PLACEMENT;  Surgeon: Marcine Matar, MD;  Location: [facility];  Service: Urology;  Laterality: Left;   CYSTOSCOPY WITH RETROGRADE PYELOGRAM, URETEROSCOPY AND STENT PLACEMENT Bilateral 10/25/2022   Procedure: CYSTOSCOPY WITH RETROGRADE PYELOGRAM, URETEROSCOPY;  Surgeon: Malen Gauze, MD;  [facility];  Service: Urology;  Laterality: Bilateral;   CYSTOSCOPY/RETROGRADE/URETEROSCOPY/STONE EXTRACTION WITH BASKET Bilateral 08/02/2015   Procedure: CYSTOSCOPY; BILATERAL RETROGRADE PYELOGRAMS; BILATERAL URETEROSCOPY, STONE EXTRACTION WITH BASKET;   Surgeon: Marcine Matar, MD;  [facility];  Service: Urology;  Laterality: Bilateral;   CYSTOSCOPY/RETROGRADE/URETEROSCOPY/STONE EXTRACTION WITH BASKET Right 06/28/2015   Procedure: CYSTOSCOPY/RETROGRADE/URETEROSCOPY/STONE EXTRACTION WITH BASKET, RIGHT URETERAL DOUBLE J STENT PLACEMENT;  Surgeon: Marcine Matar, MD;  [facility];  Service: Urology;  Laterality: Right;   ESOPHAGOGASTRODUODENOSCOPY (EGD) WITH PROPOFOL N/A 03/17/2013   Procedure: ESOPHAGOGASTRODUODENOSCOPY (EGD) WITH PROPOFOL;  Surgeon: West Bali, MD;  [facility]: [facility];  Service: Endoscopy;  Laterality: N/A;   ESOPHAGOGASTRODUODENOSCOPY (EGD) WITH PROPOFOL N/A 06/10/2015   Distal gastritis, distal esophageal stricture s/p dilation   ESOPHAGOGASTRODUODENOSCOPY (EGD) WITH PROPOFOL N/A 02/26/2017   Procedure: ESOPHAGOGASTRODUODENOSCOPY (EGD) WITH PROPOFOL;  Surgeon: West Bali, MD;  [facility];  Service: Endoscopy;  Laterality: N/A;   FLEXIBLE SIGMOIDOSCOPY  09/11/2011   [id number] Internal hemorrhoids   HOLMIUM LASER APPLICATION Left 10/21/2014   Procedure: HOLMIUM LASER APPLICATION;  Surgeon: Marcine Matar, MD;  Location: [facility];  Service: Urology;  Laterality: Left;   HOLMIUM LASER APPLICATION Right 06/28/2015   Procedure: HOLMIUM LASER APPLICATION;  Surgeon: Marcine Matar, MD;  [facility];  Service: Urology;  Laterality: Right;  HOLMIUM LASER APPLICATION  02/23/2020   Procedure: HOLMIUM LASER APPLICATION;  Surgeon: Marcine Matar, MD;  [facility];  Service: Urology;;   HOLMIUM LASER APPLICATION Left 10/18/2020   Procedure: HOLMIUM LASER APPLICATION;  Surgeon: Marcine Matar, MD;  [facility]: [facility];  Service: Urology;  Laterality: Left;   HOLMIUM LASER APPLICATION Right 10/25/2022   Procedure: HOLMIUM LASER APPLICATION;  Surgeon: Malen Gauze, MD;  Location: [facility];  Service: Urology;  Laterality: Right;   MASS EXCISION Left 03/04/2015    Procedure: EXCISION OF SOFT TISSUE NEOPLASM LEFT ARM;  Surgeon: Franky Macho, MD;  Location: [facility];  Service: General;  Laterality: Left;   PERCUTANEOUS NEPHROSTOLITHOTOMY Bilateral 05/2010   @[facility]    POLYPECTOMY N/A 03/17/2013   Procedure: GASTRIC POLYPECTOMY;  Surgeon: West Bali, MD;  [facility];  Service: Endoscopy;  Laterality: N/A;   POLYPECTOMY  02/26/2017   Procedure: POLYPECTOMY;  Surgeon: West Bali, MD;  [facility];  Service: Endoscopy;;  polyp at cecum, ascending colon polyps x3, hepatic flexure polyps x8, transverse colon polyps x8    POLYPECTOMY  09/22/2019   Procedure: POLYPECTOMY;  Surgeon: West Bali, MD;  [facility];  Service: Endoscopy;;   REMOVAL RIGHT THIGH CYST  2006   SAVORY DILATION N/A 03/17/2013   Procedure: SAVORY DILATION;  Surgeon: West Bali, MD;  Location[facility];  Service: Endoscopy;  Laterality: N/A;  #12.8, 14, 15, 16 dilators used   SAVORY DILATION N/A 06/10/2015   Procedure: SAVORY DILATION;  Surgeon: West Bali, MD;  [facility];  Service: Endoscopy;  Laterality: N/A;   VAGINAL HYSTERECTOMY  1970's    Family History  Problem Relation Age of Onset   Hypertension Mother    Diabetes Mother    Heart failure Mother    Dementia Mother    Emphysema Father    Hypertension Father    Diabetes Brother    GER disease Brother    Hypertension Sister    Hypertension Sister    Cancer Other        family history    Diabetes Other        family history    Heart defect Other        famiily history    Arthritis Other        family history    Anesthesia problems Neg Hx    Hypotension Neg Hx    Malignant hyperthermia Neg Hx    Pseudochol deficiency Neg Hx    Colon cancer Neg Hx     Current Outpatient Medications on File Prior to Visit  Medication Sig Dispense Refill   acetaminophen (TYLENOL) 500 MG tablet Take 2 tablets (1,000 mg total) by mouth at bedtime.     amLODipine (NORVASC) 10  MG tablet TAKE 1 TABLET BY MOUTH EVERY DAY 90 tablet 1   aspirin EC 81 MG tablet Take 81 mg by mouth daily. Swallow whole.     diclofenac Sodium (VOLTAREN) 1 % GEL APPLY 4 G TOPICALLY 4 (FOUR) TIMES DAILY AS NEEDED (PAIN). 400 g 5   diphenhydramine-acetaminophen (TYLENOL PM) 25-500 MG TABS tablet Take 2 tablets by mouth at bedtime.     Dulaglutide (TRULICITY) 0.75 MG/0.5ML SOAJ INJECT 0.75 MG INTO THE SKIN WEEKLY 2 mL 1   ezetimibe (ZETIA) 10 MG tablet Take 1 tablet (10 mg total) by mouth daily. 90 tablet 1   folic acid (FOLVITE) 1 MG tablet Take 1 tablet (1 mg total)  by mouth daily.     gabapentin (NEURONTIN) 300 MG capsule TAKE 1 CAPSULE BY MOUTH THREE TIMES A DAY AS NEEDED 90 capsule 5   glucose blood (ONETOUCH ULTRA) test strip USE AS DIRECTED 100 strip 5   linagliptin (TRADJENTA) 5 MG TABS tablet Take 1 tablet (5 mg total) by mouth daily.     Magnesium 400 MG CAPS Take 1 tablet by mouth daily. 30 capsule 11   mirabegron ER (MYRBETRIQ) 25 MG TB24 tablet Take 1 tablet (25 mg total) by mouth daily.     OneTouch Delica Lancets 30G MISC Once daily testing dx e11.9 100 each 5   pantoprazole (PROTONIX) 40 MG tablet TAKE 1 TABLET BY MOUTH TWICE A DAY 180 tablet 1   PARoxetine (PAXIL) 40 MG tablet Take 1 tablet (40 mg total) by mouth in the morning. 90 tablet 1   rosuvastatin (CRESTOR) 20 MG tablet TAKE 1 TABLET BY MOUTH EVERY DAY 90 tablet 3   sennosides-docusate sodium (SENOKOT-S) 8.6-50 MG tablet Take 1 tablet by mouth daily. 30 tablet 2   SYMBICORT 80-4.5 MCG/ACT inhaler INHALE 2 PUFFS INTO THE LUNGS TWICE A DAY 30.6 each 4   traMADol (ULTRAM) 50 MG tablet Take 1 tablet (50 mg total) by mouth every 6 (six) hours as needed. 60 tablet 0   UNABLE TO FIND Diabetic shoes x 1 pair, inserts x 3 pair  DX E11.9 1 each 0   vitamin B-12 (VITAMIN B12) 500 MCG tablet Take 1 tablet (500 mcg total) by mouth daily.     nitroGLYCERIN (NITROSTAT) 0.4 MG SL tablet PLACE 1 TABLET (0.4 MG TOTAL) UNDER THE TONGUE  EVERY 5 (FIVE) MINUTES AS NEEDED FOR CHEST PAIN. UP TO 3 TABLETS, THEN CALL 911. 25 tablet 3   No current facility-administered medications on file prior to visit.    Allergies  Allergen Reactions   Ace Inhibitors Cough    Patient doesn't recall  Other Reaction(s): GI Intolerance   Cephalexin Diarrhea and Nausea And Vomiting    Other Reaction(s): GI Intolerance   Nitrofurantoin Diarrhea and Nausea And Vomiting   Penicillins Hives and Other (See Comments)    Social History   Substance and Sexual Activity  Alcohol Use No   Alcohol/week: 0.0 standard drinks of alcohol    Social History   Tobacco Use  Smoking Status Former   Current packs/day: 0.00   Average packs/day: 1 pack/day for 20.0 years (20.0 ttl pk-yrs)   Types: Cigarettes   Start date: 09/21/1974   Quit date: 09/20/1988   Years since quitting: 34.9   Passive exposure: Past  Smokeless Tobacco Never    Review of Systems  Constitutional: Negative.   HENT: Negative.    Eyes: Negative.   Respiratory: Negative.    Cardiovascular: Negative.   Gastrointestinal: Negative.   Genitourinary: Negative.   Musculoskeletal: Negative.   Skin: Negative.   Neurological:  Positive for dizziness.  Endo/Heme/Allergies: Negative.   Psychiatric/Behavioral: Negative.      Objective   Vitals:   08/29/23 1039  BP: (!) 151/80  Pulse: 94  Resp: 16  Temp: 98.2 F (36.8 C)  SpO2: 94%    Physical Exam Vitals reviewed.  Constitutional:      Appearance: Normal appearance. She is not ill-appearing.  HENT:     Head: Normocephalic and atraumatic.  Cardiovascular:     Rate and Rhythm: Normal rate and regular rhythm.     Heart sounds: Normal heart sounds. No murmur heard.    No friction rub.  No gallop.  Pulmonary:     Effort: Pulmonary effort is normal. No respiratory distress.     Breath sounds: Normal breath sounds. No stridor. No wheezing, rhonchi or rales.  Musculoskeletal:     Cervical back: Neck supple.   Lymphadenopathy:     Cervical: No cervical adenopathy.  Skin:    General: Skin is warm and dry.  Neurological:     Mental Status: She is alert and oriented to person, place, and time.   Breast: No dominant mass, nipple discharge, or dimpling noted in either breast.  Both axillas are negative for palpable nodes.  Mammography and pathology reports reviewed Dr. Marice Potter note reviewed  Assessment  Invasive mammary carcinoma right breast, 0.3 cm in its greatest diameter.  Given the patient's age and size of the tumor, I do think she would benefit from excision of the tumor.  There is no need for sentinel lymph node biopsy at the present time. Plan  Patient is scheduled for a right partial mastectomy after radiofrequency tag placement on 09/11/2023.  The risks and benefits of the procedure including bleeding, infection, and the possibility of unclear margins were fully explained to the patient, who gave informed consent.  Tag placement has been scheduled for 09/10/2023.

## 2023-09-02 ENCOUNTER — Telehealth: Payer: Self-pay | Admitting: *Deleted

## 2023-09-02 NOTE — Telephone Encounter (Signed)
 Received call from patient [phone]~ telephone.   Patient reports increased episodes of insomnia. States that she used to take Tylenol PM to help her sleep, but it is not effective now.   Reports that she will only get 1-2 nights of "good" sleep per week. States that it has worsened with breast cancer Dx.  Advised that surgeon will not treat insomnia and advised to discuss with PCP.

## 2023-09-03 MED ORDER — TEMAZEPAM 7.5 MG PO CAPS: 7.5000 mg | ORAL_CAPSULE | Freq: Every evening | ORAL | 2 refills | Status: DC | PRN

## 2023-09-03 NOTE — Addendum Note (Signed)
 Addended by: Kerri Perches on: 09/03/2023 05:39 PM   Modules accepted: Orders

## 2023-09-03 NOTE — Telephone Encounter (Signed)
 Pls advise pt that restoril has been prescribed for sleep. Also needs to practice good sleep hygiene Light and sound off at a certain time Get into bed for sleep No stimulant  like coffee near to bedtime Practice good routine of relaxing music or reading in a quiet/ special place in her home for 30 mins to 1 hr before she gets into bed. Pls review tthis with her

## 2023-09-04 ENCOUNTER — Emergency Department (HOSPITAL_COMMUNITY)
Admission: EM | Admit: 2023-09-04 | Discharge: 2023-09-04 | Disposition: A | Discharge status: Home / Self Care | Attending: Emergency Medicine | Admitting: Emergency Medicine

## 2023-09-04 ENCOUNTER — Other Ambulatory Visit: Payer: Self-pay | Admitting: Family Medicine

## 2023-09-04 ENCOUNTER — Encounter (HOSPITAL_COMMUNITY): Payer: Self-pay | Admitting: Emergency Medicine

## 2023-09-04 ENCOUNTER — Emergency Department (HOSPITAL_COMMUNITY)

## 2023-09-04 ENCOUNTER — Other Ambulatory Visit: Payer: Self-pay

## 2023-09-04 DIAGNOSIS — W19XXXA Unspecified fall, initial encounter: Secondary | ICD-10-CM | POA: Diagnosis not present

## 2023-09-04 DIAGNOSIS — Z87891 Personal history of nicotine dependence: Secondary | ICD-10-CM | POA: Insufficient documentation

## 2023-09-04 DIAGNOSIS — Z79899 Other long term (current) drug therapy: Secondary | ICD-10-CM | POA: Insufficient documentation

## 2023-09-04 DIAGNOSIS — J9811 Atelectasis: Secondary | ICD-10-CM | POA: Diagnosis not present

## 2023-09-04 DIAGNOSIS — D631 Anemia in chronic kidney disease: Secondary | ICD-10-CM | POA: Insufficient documentation

## 2023-09-04 DIAGNOSIS — I129 Hypertensive chronic kidney disease with stage 1 through stage 4 chronic kidney disease, or unspecified chronic kidney disease: Secondary | ICD-10-CM | POA: Diagnosis not present

## 2023-09-04 DIAGNOSIS — E1122 Type 2 diabetes mellitus with diabetic chronic kidney disease: Secondary | ICD-10-CM | POA: Insufficient documentation

## 2023-09-04 DIAGNOSIS — M1612 Unilateral primary osteoarthritis, left hip: Secondary | ICD-10-CM | POA: Diagnosis not present

## 2023-09-04 DIAGNOSIS — Y92002 Bathroom of unspecified non-institutional (private) residence single-family (private) house as the place of occurrence of the external cause: Secondary | ICD-10-CM | POA: Diagnosis not present

## 2023-09-04 DIAGNOSIS — Z8673 Personal history of transient ischemic attack (TIA), and cerebral infarction without residual deficits: Secondary | ICD-10-CM | POA: Insufficient documentation

## 2023-09-04 DIAGNOSIS — Z853 Personal history of malignant neoplasm of breast: Secondary | ICD-10-CM | POA: Insufficient documentation

## 2023-09-04 DIAGNOSIS — S76012A Strain of muscle, fascia and tendon of left hip, initial encounter: Secondary | ICD-10-CM | POA: Insufficient documentation

## 2023-09-04 DIAGNOSIS — N183 Chronic kidney disease, stage 3 unspecified: Secondary | ICD-10-CM | POA: Insufficient documentation

## 2023-09-04 DIAGNOSIS — I10 Essential (primary) hypertension: Secondary | ICD-10-CM | POA: Diagnosis not present

## 2023-09-04 DIAGNOSIS — W010XXA Fall on same level from slipping, tripping and stumbling without subsequent striking against object, initial encounter: Secondary | ICD-10-CM | POA: Diagnosis not present

## 2023-09-04 DIAGNOSIS — R9389 Abnormal findings on diagnostic imaging of other specified body structures: Secondary | ICD-10-CM | POA: Diagnosis not present

## 2023-09-04 DIAGNOSIS — Z043 Encounter for examination and observation following other accident: Secondary | ICD-10-CM | POA: Diagnosis not present

## 2023-09-04 DIAGNOSIS — Z7982 Long term (current) use of aspirin: Secondary | ICD-10-CM | POA: Insufficient documentation

## 2023-09-04 DIAGNOSIS — Z743 Need for continuous supervision: Secondary | ICD-10-CM | POA: Diagnosis not present

## 2023-09-04 DIAGNOSIS — M25552 Pain in left hip: Secondary | ICD-10-CM | POA: Diagnosis not present

## 2023-09-04 LAB — CBC WITH DIFFERENTIAL/PLATELET
Abs Immature Granulocytes: 0.02 10*3/uL (ref 0.00–0.07)
Basophils Absolute: 0.1 10*3/uL (ref 0.0–0.1)
Basophils Relative: 1 %
Eosinophils Absolute: 0.2 10*3/uL (ref 0.0–0.5)
Eosinophils Relative: 3 %
HCT: 34.2 % — ABNORMAL LOW (ref 36.0–46.0)
Hemoglobin: 10.6 g/dL — ABNORMAL LOW (ref 12.0–15.0)
Immature Granulocytes: 0 %
Lymphocytes Relative: 19 %
Lymphs Abs: 1.6 10*3/uL (ref 0.7–4.0)
MCH: 27.8 pg (ref 26.0–34.0)
MCHC: 31 g/dL (ref 30.0–36.0)
MCV: 89.8 fL (ref 80.0–100.0)
Monocytes Absolute: 1 10*3/uL (ref 0.1–1.0)
Monocytes Relative: 12 %
Neutro Abs: 5.4 10*3/uL (ref 1.7–7.7)
Neutrophils Relative %: 65 %
Platelets: 305 10*3/uL (ref 150–400)
RBC: 3.81 MIL/uL — ABNORMAL LOW (ref 3.87–5.11)
RDW: 19.6 % — ABNORMAL HIGH (ref 11.5–15.5)
WBC: 8.2 10*3/uL (ref 4.0–10.5)
nRBC: 0 % (ref 0.0–0.2)

## 2023-09-04 LAB — RESP PANEL BY RT-PCR (RSV, FLU A&B, COVID)  RVPGX2
Influenza A by PCR: NEGATIVE
Influenza B by PCR: NEGATIVE
Resp Syncytial Virus by PCR: NEGATIVE
SARS Coronavirus 2 by RT PCR: NEGATIVE

## 2023-09-04 LAB — BASIC METABOLIC PANEL
Anion gap: 9 (ref 5–15)
BUN: 18 mg/dL (ref 8–23)
CO2: 24 mmol/L (ref 22–32)
Calcium: 9.8 mg/dL (ref 8.9–10.3)
Chloride: 104 mmol/L (ref 98–111)
Creatinine, Ser: 1.52 mg/dL — ABNORMAL HIGH (ref 0.44–1.00)
GFR, Estimated: 35 mL/min — ABNORMAL LOW (ref >60)
Glucose, Bld: 111 mg/dL — ABNORMAL HIGH (ref 70–99)
Potassium: 3.7 mmol/L (ref 3.5–5.1)
Sodium: 137 mmol/L (ref 135–145)

## 2023-09-04 MED ORDER — ALBUTEROL SULFATE HFA 108 (90 BASE) MCG/ACT IN AERS
2.0000 | INHALATION_SPRAY | Freq: Once | RESPIRATORY_TRACT | Status: AC
  Administered 2023-09-04: 2 via RESPIRATORY_TRACT
  Filled 2023-09-04: qty 6.7

## 2023-09-04 MED ORDER — HYDROCODONE-ACETAMINOPHEN 5-325 MG PO TABS
1.0000 | ORAL_TABLET | Freq: Once | ORAL | Status: AC
  Administered 2023-09-04: 1 via ORAL
  Filled 2023-09-04: qty 1

## 2023-09-04 NOTE — ED Provider Notes (Signed)
 [place] EMERGENCY DEPARTMENT AT [facility] Provider Note   CSN: [id number] Arrival date & time: 09/04/23  0155     History  Chief Complaint  Patient presents with   Cheryl Abbott is a 78 y.o. female.  The history is provided by the patient.  Patient history of breast cancer, CKD, hypertension presents after accidental fall. Patient reports she was getting up to go the bathroom when she slipped and fell landing on her left hip.  No head injury or LOC reported.  Reports most of the pain is in her left hip and shoulders.  No headache or neck pain.  No chest or abdominal pain.  She is not on anticoagulation   Patient reports overall she is felt "lousy" has been having nausea and congestion in the mornings No chest pain or shortness of breath.  No vomiting Past Medical History:  Diagnosis Date   Anxiety disorder    Arthritis    Chronic back pain    Chronic pain of both shoulders    per pt told due to cervical spine pinched nerve   CKD (chronic kidney disease), stage III [facility])    nephrologist--- dr Wolfgang Phoenix;   due to tubular necrosis   Claustrophobia    Depression    Edema of both lower extremities    GERD (gastroesophageal reflux disease)    Headache    Hemorrhoids    w/ intermittant bleeding   History of adenomatous polyp of colon    History of kidney stones    Hx-TIA (transient ischemic attack) 02/26/2016   per pt no residual   Hydronephrosis, left    followed by urologist--- dr Retta Diones;   persistant   Hyperlipidemia    Hypertension    Iron deficiency    followed by [facility] w/ hx iron infusions   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   oncologist--- [facility];  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 Authorizing Provider  acetaminophen (TYLENOL) 500 MG tablet Take 2 tablets (1,000 mg total) by mouth at bedtime. 10/19/22   Johnson, Clanford L, MD  amLODipine (NORVASC) 10 MG tablet TAKE 1 TABLET BY MOUTH EVERY DAY 06/25/23   Kerri Perches, MD  aspirin EC 81 MG tablet Take 81 mg by mouth daily. Swallow whole.    [provider]  diclofenac Sodium (VOLTAREN) 1 % GEL APPLY 4 G TOPICALLY 4 (FOUR) TIMES DAILY AS NEEDED (PAIN). 11/19/22   Vickki Hearing, MD  diphenhydramine-acetaminophen (TYLENOL PM) 25-500 MG TABS tablet Take 2 tablets by mouth at bedtime.    [provider]  Dulaglutide (TRULICITY) 0.75 MG/0.5ML SOAJ INJECT 0.75 MG INTO THE SKIN WEEKLY 07/05/23   Kerri Perches, MD  ezetimibe (ZETIA) 10  MG tablet Take 1 tablet (10 mg total) by mouth daily. 04/30/23   Kerri Perches, MD  folic acid (FOLVITE) 1 MG tablet Take 1 tablet (1 mg total) by mouth daily. 09/28/22   Catarina Hartshorn, MD  gabapentin (NEURONTIN) 300 MG capsule TAKE 1 CAPSULE BY MOUTH THREE TIMES A DAY AS NEEDED 03/18/23   Vickki Hearing, MD  glucose blood [facility] ULTRA) test strip USE AS DIRECTED 08/13/22   Kerri Perches, MD  linagliptin (TRADJENTA) 5 MG TABS tablet Take 1 tablet (5 mg total) by mouth daily. 07/16/23   Kerri Perches, MD  Magnesium 400 MG CAPS Take 1 tablet by mouth daily. 11/21/22   McKenzie, Mardene Celeste, MD  mirabegron ER (MYRBETRIQ) 25 MG TB24 tablet Take 1 tablet (25 mg total) by mouth daily. 08/14/23   McKenzie, Mardene Celeste, MD  nitroGLYCERIN (NITROSTAT) 0.4 MG SL tablet PLACE 1 TABLET (0.4 MG TOTAL) UNDER THE  TONGUE EVERY 5 (FIVE) MINUTES AS NEEDED FOR CHEST PAIN. UP TO 3 TABLETS, THEN CALL 911. 02/07/23 05/08/23  Iran Ouch, Lennart Pall, PA-C  OneTouch Delica Lancets 30G MISC Once daily testing dx e11.9 01/07/20   Kerri Perches, MD  pantoprazole (PROTONIX) 40 MG tablet TAKE 1 TABLET BY MOUTH TWICE A DAY 06/25/23   Kerri Perches, MD  PARoxetine (PAXIL) 40 MG tablet Take 1 tablet (40 mg total) by mouth in the morning. 08/02/23   Kerri Perches, MD  rosuvastatin (CRESTOR) 20 MG tablet TAKE 1 TABLET BY MOUTH EVERY DAY 07/05/23   Kerri Perches, MD  sennosides-docusate sodium (SENOKOT-S) 8.6-50 MG tablet Take 1 tablet by mouth daily. 06/19/23   Anabel Halon, MD  SYMBICORT 80-4.5 MCG/ACT inhaler INHALE 2 PUFFS INTO THE LUNGS TWICE A DAY 04/01/23   Kerri Perches, MD  temazepam (RESTORIL) 7.5 MG capsule Take 1 capsule (7.5 mg total) by mouth at bedtime as needed for sleep. 09/03/23   Kerri Perches, MD  traMADol (ULTRAM) 50 MG tablet Take 1 tablet (50 mg total) by mouth every 6 (six) hours as needed. 08/07/23   Vickki Hearing, MD  UNABLE TO FIND Diabetic shoes x 1 pair, inserts x 3 pair  DX E11.9 05/09/21   Kerri Perches, MD  vitamin B-12 (VITAMIN B12) 500 MCG tablet Take 1 tablet (500 mcg total) by mouth daily. 09/28/22   Catarina Hartshorn, MD      Allergies    Ace inhibitors, Cephalexin, Nitrofurantoin, and Penicillins    Review of Systems   Review of Systems  Musculoskeletal:  Positive for arthralgias.  Neurological:  Negative for headaches.    Physical Exam Updated Vital Signs BP 126/74 (BP Location: Right Arm)   Pulse 79   Temp 98.3 F (36.8 C) (Oral)   Resp 19   Ht 1.676 m (5\' 6" )   Wt 82.1 kg   SpO2 92%   BMI 29.21 kg/m  Physical Exam CONSTITUTIONAL: Elderly, flat affect HEAD: Normocephalic/atraumatic, no visible trauma EYES: EOMI/PERRL ENMT: Mucous membranes moist, no facial trauma NECK: supple no meningeal signs SPINE/BACK:entire spine nontender No  bruising/crepitance/stepoffs noted to spine CV: S1/S2 noted, no murmurs/rubs/gallops noted LUNGS: Lungs are clear to auscultation bilaterally, no apparent distress ABDOMEN: soft, nontender GU:no cva tenderness NEURO: Pt is awake/alert/appropriate, moves all extremitiesx4.  No facial droop.  GCS 15 EXTREMITIES: pulses normal/equal, full ROM Mild tenderness to range of motion of left hip, no deformities All other extremities/joints palpated/ranged and nontender SKIN: warm, color normal PSYCH: Flat affect  ED Results / Procedures / Treatments   Labs (all labs ordered are listed, but only abnormal results are displayed) Labs Reviewed  CBC WITH DIFFERENTIAL/PLATELET - Abnormal; Notable for the following components:      Result Value   RBC 3.81 (*)    Hemoglobin 10.6 (*)    HCT 34.2 (*)    RDW 19.6 (*)    All other components within normal limits  BASIC METABOLIC PANEL - Abnormal; Notable for the following components:   Glucose, Bld 111 (*)    Creatinine, Ser 1.52 (*)    GFR, Estimated 35 (*)    All other components within normal limits  RESP PANEL BY RT-PCR (RSV, FLU A&B, COVID)  [id number]    EKG EKG Interpretation Date/Time:  Wednesday September 04 2023 02:51:22 EDT Ventricular Rate:  85 PR Interval:  187 QRS Duration:  88 QT Interval:  383 QTC Calculation: 456 R Axis:   -25  Text Interpretation: Sinus rhythm Borderline left axis deviation Low voltage, precordial leads No significant change since last tracing Confirmed by Zadie Rhine ([id number]) on 09/04/2023 3:00:18 AM  Radiology DG Chest Portable 1 View Result Date: 09/04/2023 CLINICAL DATA:  Recent fall EXAM: PORTABLE CHEST 1 VIEW COMPARISON:  09/23/2018 FINDINGS: Cardiac shadow is within normal limits. Elevation of the right hemidiaphragm is noted. Stable right basilar atelectasis is seen. No bony abnormality is noted. IMPRESSION: No acute abnormality noted. Electronically Signed   By: Alcide Clever M.D.   On: 09/04/2023 02:48    DG Hip Unilat W or Wo Pelvis 2-3 Views Left Result Date: 09/04/2023 CLINICAL DATA:  Recent fall with left hip pain, initial encounter EXAM: DG HIP (WITH OR WITHOUT PELVIS) 2-3V LEFT COMPARISON:  None Available. FINDINGS: Pelvic ring is intact. Degenerative changes of the hip joints are seen. Proximal femur appears within normal limits. No acute fracture or dislocation is noted. No soft tissue changes are seen. IMPRESSION: No acute abnormality noted. Electronically Signed   By: Alcide Clever M.D.   On: 09/04/2023 02:48    Procedures Procedures    Medications Ordered in ED Medications  HYDROcodone-acetaminophen (NORCO/VICODIN) 5-325 MG per tablet 1 tablet (1 tablet Oral Given 09/04/23 0249)  albuterol (VENTOLIN HFA) 108 (90 Base) MCG/ACT inhaler 2 puff (2 puffs Inhalation Given 09/04/23 0356)    ED Course/ Medical Decision Making/ A&P Clinical Course as of 09/04/23 0357  Wed Sep 04, 2023  0237 Hemoglobin(!): 10.6 Chronic mild anemia [DW]  0237 Patient presents after accidental fall landing on her left hip.  She is in no acute distress.  No obvious extremity deformities.  Patient also reports she has not felt well recently but does not have any overall specific complaints.  Will expand her workup with labs  Patient does have a history of breast cancer, is having lumpectomy soon [DW]  0258 Creatinine(!): 1.52 Mild renal insufficiency that is improved [DW]  0322 Has brief drops in her O2 sat, that quickly corrected.  She is in no acute distress, denies any chest pain or shortness of breath. Sounds are clear. [DW]  0346 Overall workup was unremarkable No acute fractures noted on imaging.  Patient is ambulatory without difficulty.  Low suspicion for occult fracture. [DW]  [phone] Patient does have brief drops in her pulse ox but likely due to shallow breathing because as soon as I have her take deep breaths at all corrects.  She reports she did not have her albuterol tonight, will give her a dose  here [DW]  [phone]  I feel patient is safer discharge home.  She has had increased anxiety recently due to her impending surgery and breast cancer diagnosis.  She has had some decreased sleep.  Advised her to follow-up with her PCP for any new medications for sleep [DW]    Clinical Course User Index [DW] Zadie Rhine, MD           Glasgow Coma Scale Score: 15      NEXUS Criteria Score: 0                Medical Decision Making Amount and/or Complexity of Data Reviewed Labs: ordered. Decision-making details documented in ED Course. Radiology: ordered. ECG/medicine tests: ordered.  Risk Prescription drug management.   This patient presents to the ED for concern of fall with hip pain, this involves an extensive number of treatment options, and is a complaint that carries with it a high risk of complications and morbidity.  The differential diagnosis includes but is not limited to muscle strain, hip fracture, hip dislocation, lumbar fracture pelvic ring fracture  Comorbidities that complicate the patient evaluation: Patient's presentation is complicated by their history of breast cancer  Social Determinants of Health: Patient's  former tobacco use   increases the complexity of managing their presentation  Additional history obtained: Records reviewed  outpatient records reviewed  Lab Tests: I Ordered, and personally interpreted labs.  The pertinent results include: Chronic renal insufficiency  Imaging Studies ordered: I ordered imaging studies including X-ray chest and left hip   I independently visualized and interpreted imaging which showed no acute findings I agree with the radiologist interpretation  Medicines ordered and prescription drug management: I ordered medication including Vicodin for pain Reevaluation of the patient after these medicines showed that the patient    improved   Reevaluation: After the interventions noted above, I reevaluated the patient and found  that they have :improved  Complexity of problems addressed: Patient's presentation is most consistent with  acute presentation with potential threat to life or bodily function  Disposition: After consideration of the diagnostic results and the patient's response to treatment,  I feel that the patent would benefit from discharge   .           Final Clinical Impression(s) / ED Diagnoses Final diagnoses:  Fall, initial encounter  Hip strain, left, initial encounter    Rx / DC Orders ED Discharge Orders     None         Zadie Rhine, MD 09/04/23 [phone]

## 2023-09-04 NOTE — Telephone Encounter (Signed)
Pt aware.

## 2023-09-04 NOTE — ED Notes (Signed)
 [facility] called at this time. Donalyn Schneeberger

## 2023-09-04 NOTE — Telephone Encounter (Signed)
 Copied from CRM [id number]. Topic: Clinical - Medication Refill >> Sep 04, 2023  4:24 PM Gildardo Pounds wrote: Most Recent Primary Care Visit:  Provider: Syliva Overman E  Department: [facility]  Visit Type: PHYSICAL  Date: 07/16/2023  Medication: temazepam (RESTORIL) 7.5 MG capsule   Has the patient contacted their pharmacy? Yes (Agent: If no, request that the patient contact the pharmacy for the refill. If patient does not wish to contact the pharmacy document the reason why and proceed with request.) (Agent: If yes, when and what did the pharmacy advise?)  Is this the correct pharmacy for this prescription? Yes If no, delete pharmacy and type the correct one.  This is the patient's preferred pharmacy:  [id number] - [place], [place] - [address] AT [facility] [address] [place] [address] Phone: [phone] Fax: [phone]   Has the prescription been filled recently? No  Is the patient out of the medication? Yes  Has the patient been seen for an appointment in the last year OR does the patient have an upcoming appointment? Yes  Can we respond through MyChart? No  Agent: Please be advised that Rx refills may take up to 3 business days. We ask that you follow-up with your pharmacy.

## 2023-09-04 NOTE — Telephone Encounter (Signed)
 Copied from CRM [id number]. Topic: Clinical - Prescription Issue >> Sep 04, 2023  4:25 PM Gildardo Pounds wrote: Reason for CRM: [facility] states that a prior authorization is needed for temazepam (RESTORIL) 7.5 MG capsule. Callback number is [phone]

## 2023-09-04 NOTE — Patient Instructions (Signed)
 Cheryl Abbott  09/04/2023     @PREFPERIOPPHARMACY @   Your procedure is scheduled on 09/11/2023.   Report to [facility] at 10:50 [facility].M.   Call this number if you have problems the morning of surgery:  [phone]  If you experience any cold or flu symptoms such as cough, fever, chills, shortness of breath, etc. between now and your scheduled surgery, please notify us at the above number.   Remember:   Do not eat after midnight.   You may drink clear liquids until 8:50 AM the morning of the procedure .    Clear liquids allowed are:                    Water, Juice (No red color; non-citric and without pulp; diabetics please choose diet or no sugar options), Carbonated beverages (diabetics please choose diet or no sugar options), Clear Tea (No creamer, milk, or cream, including half & half and powdered creamer), Black Coffee Only (No creamer, milk or cream, including half & half and powdered creamer), and Clear Sports drink (No red color; diabetics please choose diet or no sugar options)    Take these medicines the morning of surgery with A SIP OF WATER : Norvasc, Neurontin, Mirabegron Protonix Paxil and Ultram (if needed)   Last dose of Trulicity should be on 09/03/23 or before.     Do not wear jewelry, make-up or nail polish, including gel polish,  artificial nails, or any other type of covering on natural nails (fingers and  toes).  Do not wear lotions, powders, or perfumes, or deodorant.  Do not shave 48 hours prior to surgery.  Men may shave face and neck.  Do not bring valuables to the hospital.  [facility] is not responsible for any belongings or valuables.  Contacts, dentures or bridgework may not be worn into surgery.  Leave your suitcase in the car.  After surgery it may be brought to your room.  For patients admitted to the hospital, discharge time will be determined by your treatment team.  Patients discharged the day of surgery will not be allowed to drive home.    Name and phone number of your driver:   Family  Special instructions:  N/A  Please read over the following fact sheets that you were given.  Care and Recovery After Surgery  Lumpectomy  A lumpectomy, sometimes called a partial mastectomy, is surgery to remove a cancerous tumor or mass (the lump) from a breast. It is a form of breast-conserving or breast-preservation surgery. This means that the cancerous tissue is removed but the breast remains intact. During a lumpectomy, the portion of the breast that contains the tumor is removed. Lymph nodes under your arm may also be removed. Lymph nodes are part of the body's disease-fighting system (immune system) and are usually the first place where breast cancer spreads. Tell a health care provider about: Any allergies you have. All medicines you are taking, including vitamins, herbs, eye drops, creams, and over-the-counter medicines. Any problems you or family members have had with anesthetic medicines. Any bleeding problems you have. Any surgeries you have had. Any medical conditions you have. Whether you are pregnant or may be pregnant. What are the risks? Generally, this is a safe procedure. However, problems may occur, including: Bleeding. Infection. Allergic reaction to medicines. Pain, swelling, weakness, or numbness in the arm on the side of your surgery. Temporary swelling. Change in the shape of the breast, especially if a  large portion is removed. Scar tissue that forms at the surgical site and feels hard to the touch. Blood clots. What happens before the procedure? When to stop eating and drinking Follow instructions from your health care provider about what you may eat and drink. These may include: 8 hours before your procedure Stop eating most foods. Do not eat meat, fried foods, or fatty foods. Eat only light foods, such as toast or crackers. All liquids are okay except energy drinks and alcohol. 6 hours before your  procedure Stop eating. Drink only clear liquids, such as water, clear fruit juice, black coffee, plain tea, and sports drinks. Do not drink energy drinks or alcohol. 2 hours before your procedure Stop drinking all liquids. You may be allowed to take medicines with small sips of water. If you do not follow your health care provider's instructions, your procedure may be delayed or canceled. Medicines Ask your health care provider about: Changing or stopping your regular medicines. These include any diabetes medicines or blood thinners you take. Taking medicines such as aspirin and ibuprofen. These medicines can thin your blood. Do not take them unless your health care provider tells you to. Taking over-the-counter medicines, vitamins, herbs, and supplements. Surgery safety Ask your health care provider how your surgery site will be marked. A procedure may be done to locate and mark the tumor area in the breast (localization). This will guide the surgeon to where the incision will be made. This may be done with: Imaging, such as a mammogram, ultrasound, or MRI. Insertion of a small wire, clip, or seed, or an implant that will reflect a radar signal. Also, ask what steps will be taken to help prevent infection. These may include: Washing skin with a germ-killing soap. Taking antibiotic medicine. General instructions You may have screening tests or exams to get normal measurements of your arm, also called baseline measurements. These can be compared to measurements done after surgery to monitor for swelling (lymphedema) that can develop after having lymph nodes removed. If you will be going home right after the procedure, plan to have a responsible adult: Take you home from the hospital or clinic. You will not be allowed to drive. Care for you for the time you are told. What happens during the procedure?  An IV will be inserted into one of your veins. You may be given: A sedative. This helps  you relax. Anesthesia. This will: Numb certain areas of your body. Make you fall asleep for surgery. An electric scalpel will be used to reduce bleeding (electrocautery knife). A curved incision that follows the natural curve of your breast will be made. The tumor will be removed along with some of the tissue around it. This will be sent to the lab for testing. Your health care provider may also remove lymph nodes at this time if needed. If the tumor is close to the muscles over your chest, some muscle tissue may also be removed. A small drain tube may be inserted into your breast area or armpit to collect fluid that may build up after surgery. This tube will be connected to a suction bulb on the outside of your body to remove the fluid. The incision will be closed with stitches (sutures). A bandage (dressing) may be placed over the incision. The procedure may vary among health care providers and hospitals. What happens after the procedure? Your blood pressure, heart rate, breathing rate, and blood oxygen level will be monitored until you leave the hospital or  clinic. You will be given medicine for pain as needed. You will be encouraged to get up and walk as soon as you can. This will improve blood flow and breathing. Ask for help if you feel weak or unsteady. You may have a drain tube in place for 2-3 days to prevent a collection of blood (hematoma) from developing in the breast. You may have a pressure bandage applied for 1-2 days to prevent bleeding or swelling. Ask your health care provider how to care for your bandage at home. You may be given a tight sleeve to wear over your arm on the side of your surgery. Wear the sleeve as told by your health care provider. Do not drive or operate machinery until your health care provider says that it is safe. Where to find more information American Cancer Society: cancer.org National Cancer Institute: cancer.gov Summary A lumpectomy, sometimes called  a partial mastectomy, is surgery to remove a cancerous tumor or mass (the lump) from a breast. During a lumpectomy, the portion of the breast that contains the tumor is removed. Plan to have someone take you home from the hospital or clinic. You may have a drain tube in place for 2-3 days to prevent a collection of blood (hematoma) from developing in the breast. This information is not intended to replace advice given to you by your health care provider. Make sure you discuss any questions you have with your health care provider. Document Revised: 08/21/2021 Document Reviewed: 08/06/2021 Elsevier Patient Education  2024 Elsevier Inc.  General Anesthesia, Adult General anesthesia is the use of medicine to make you fall asleep (unconscious) for a medical procedure. General anesthesia must be used for certain procedures. It is often recommended for surgery or procedures that: Last a long time. Require you to be still or in an unusual position. Are major and can cause blood loss. Affect your breathing. The medicines used for general anesthesia are called general anesthetics. During general anesthesia, these medicines are given along with medicines that: Prevent pain. Control your blood pressure. Relax your muscles. Prevent nausea and vomiting after the procedure. Tell a health care provider about: Any allergies you have. All medicines you are taking, including vitamins, herbs, eye drops, creams, and over-the-counter medicines. Your history of any: Medical conditions you have, including: High blood pressure. Bleeding problems. Diabetes. Heart or lung conditions, such as: Heart failure. Sleep apnea. Asthma. Chronic obstructive pulmonary disease (COPD). Current or recent illnesses, such as: Upper respiratory, chest, or ear infections. Cough or fever. Tobacco or drug use, including marijuana or alcohol use. Depression or anxiety. Surgeries and types of anesthetics you have  had. Problems you or family members have had with anesthetic medicines. Whether you are pregnant or may be pregnant. Whether you have any chipped or loose teeth, dentures, caps, bridgework, or issues with your mouth, swallowing, or choking. What are the risks? Your health care provider will talk with you about risks. These may include: Allergic reaction to the medicines. Lung and heart problems. Inhaling food or liquid from the stomach into the lungs (aspiration). Nerve injury. Injury to the lips, mouth, teeth, or gums. Stroke. Waking up during your procedure and being unable to move. This is rare. These problems are more likely to develop if you are having a major surgery or if you have an advanced or serious medical condition. You can prevent some of these complications by answering all of your health care provider's questions thoroughly and by following all instructions before your procedure. General  anesthesia can cause side effects, including: Nausea or vomiting. A sore throat or hoarseness from the breathing tube. Wheezing or coughing. Shaking chills or feeling cold. Body aches. Sleepiness. Confusion, agitation (delirium), or anxiety. What happens before the procedure? When to stop eating and drinking Follow instructions from your health care provider about what you may eat and drink before your procedure. If you do not follow your health care provider's instructions, your procedure may be delayed or canceled. Medicines Ask your health care provider about: Changing or stopping your regular medicines. These include any diabetes medicines or blood thinners you take. Taking medicines such as aspirin and ibuprofen. These medicines can thin your blood. Do not take them unless your health care provider tells you to. Taking over-the-counter medicines, vitamins, herbs, and supplements. General instructions Do not use any products that contain nicotine or tobacco for at least 4 weeks  before the procedure. These products include cigarettes, chewing tobacco, and vaping devices, such as e-cigarettes. If you need help quitting, ask your health care provider. If you brush your teeth on the morning of the procedure, make sure to spit out all of the water and toothpaste. If told by your health care provider, bring your sleep apnea device with you to surgery (if applicable). If you will be going home right after the procedure, plan to have a responsible adult: Take you home from the hospital or clinic. You will not be allowed to drive. Care for you for the time you are told. What happens during the procedure?  An IV will be inserted into one of your veins. You will be given one or more of the following through a face mask or IV: A sedative. This helps you relax. Anesthesia. This will: Numb certain areas of your body. Make you fall asleep for surgery. After you are unconscious, a breathing tube may be inserted down your throat to help you breathe. This will be removed before you wake up. An anesthesia provider, such as an anesthesiologist, will stay with you throughout your procedure. The anesthesia provider will: Keep you comfortable and safe by continuing to give you medicines and adjusting the amount of medicine that you get. Monitor your blood pressure, heart rate, and oxygen levels to make sure that the anesthetics do not cause any problems. The procedure may vary among health care providers and hospitals. What happens after the procedure? Your blood pressure, temperature, heart rate, breathing rate, and blood oxygen level will be monitored until you leave the hospital or clinic. You will wake up in a recovery area. You may wake up slowly. You may be given medicine to help you with pain, nausea, or any other side effects from the anesthesia. Summary General anesthesia is the use of medicine to make you fall asleep (unconscious) for a medical procedure. Follow your health  care provider's instructions about when to stop eating, drinking, or taking certain medicines before your procedure. Plan to have a responsible adult take you home from the hospital or clinic. This information is not intended to replace advice given to you by your health care provider. Make sure you discuss any questions you have with your health care provider. Document Revised: 08/24/2021 Document Reviewed: 08/24/2021 Elsevier Patient Education  2024 Elsevier Inc.  How to Use Chlorhexidine at Home in the Shower Chlorhexidine gluconate (CHG) is a germ-killing (antiseptic) wash that's used to clean the skin. It can get rid of the germs that normally live on the skin and can keep them away for about  24 hours. If you're having surgery, you may be told to shower with CHG at home the night before surgery. This can help lower your risk for infection. To use CHG wash in the shower, follow the steps below. Supplies needed: CHG body wash. Clean washcloth. Clean towel. How to use CHG in the shower Follow these steps unless you're told to use CHG in a different way: Start the shower. Use your normal soap and shampoo to wash your face and hair. Turn off the shower or move out of the shower stream. Pour CHG onto a clean washcloth. Do not use any type of brush or rough sponge. Start at your neck, washing your body down to your toes. Make sure you: Wash the part of your body where the surgery will be done for at least 1 minute. Do not scrub. Do not use CHG on your head or face unless your health care provider tells you to. If it gets into your ears or eyes, rinse them well with water. Do not wash your genitals with CHG. Wash your back and under your arms. Make sure to wash skin folds. Let the CHG sit on your skin for 1-2 minutes or as long as told. Rinse your entire body in the shower, including all body creases and folds. Turn off the shower. Dry off with a clean towel. Do not put anything on your  skin afterward, such as powder, lotion, or perfume. Put on clean clothes or pajamas. If it's the night before surgery, sleep in clean sheets. General tips Use CHG only as told, and follow the instructions on the label. Use the full amount of CHG as told. This is often one bottle. Do not smoke and stay away from flames after using CHG. Your skin may feel sticky after using CHG. This is normal. The sticky feeling will go away as the CHG dries. Do not use CHG: If you have a chlorhexidine allergy or have reacted to chlorhexidine in the past. On open wounds or areas of skin that have broken skin, cuts, or scrapes. On babies younger than 77 months of age. Contact a health care provider if: You have questions about using CHG. Your skin gets irritated or itchy. You have a rash after using CHG. You swallow any CHG. Call your local poison control center [phone] in the [place] Your eyes itch badly, or they become very red or swollen. Your hearing changes. You have trouble seeing. If you can't reach your provider, go to an urgent care or emergency room. Do not drive yourself. Get help right away if: You have swelling or tingling in your mouth or throat. You make high-pitched whistling sounds when you breathe, most often when you breathe out (wheeze). You have trouble breathing. These symptoms may be an emergency. Call 911 right away. Do not wait to see if the symptoms will go away. Do not drive yourself to the hospital. This information is not intended to replace advice given to you by your health care provider. Make sure you discuss any questions you have with your health care provider. Document Revised: 12/11/2022 Document Reviewed: 12/07/2021 Elsevier Patient Education  2024 [employer].

## 2023-09-04 NOTE — ED Notes (Signed)
 ED Provider at bedside.

## 2023-09-04 NOTE — ED Notes (Signed)
 Pt is unable to use the restroom and give a urine sample. RN and MD made aware.

## 2023-09-04 NOTE — ED Triage Notes (Signed)
 Pt fell getting up to bathroom and she c/o left hip pain. Pt was ambulatory on scene.

## 2023-09-05 ENCOUNTER — Telehealth: Payer: Self-pay

## 2023-09-05 NOTE — Transitions of Care (Post Inpatient/ED Visit) (Signed)
 09/05/2023  Name: Cheryl Abbott MRN: [id number] DOB: 1946-04-29  Today's TOC FU Call Status: Today's TOC FU Call Status:: Successful TOC FU Call Completed TOC FU Call Complete Date: 09/05/23 Patient's Name and Date of Birth confirmed.  Transition Care Management Follow-up Telephone Call Date of Discharge: 09/04/23 [facility] Type of Discharge: Emergency Department Reason for ED Visit: Orthopedic Conditions (Fall) Orthopedic/Injury Diagnosis: Sprain or Strain How have you been since you were released from the hospital?: Better Any questions or concerns?: No  Items Reviewed: Did you receive and understand the discharge instructions provided?: Yes Medications obtained,verified, and reconciled?: Yes (Medications Reviewed) Any new allergies since your discharge?: No Dietary orders reviewed?: NA  Medications Reviewed Today: Medications Reviewed Today     Reviewed by Anthoney Harada, LPN (Licensed Practical Nurse) on 09/05/23 at [phone]  Med List Status: <None>   Medication Order Taking? Sig Documenting Provider Last Dose Status Informant  acetaminophen (TYLENOL) 500 MG tablet [id number] Yes Take 2 tablets (1,000 mg total) by mouth at bedtime.  Patient taking differently: Take 1,000 mg by mouth every 6 (six) hours as needed for moderate pain (pain score 4-6).   Cleora Fleet, MD Taking Active Self  amLODipine (NORVASC) 10 MG tablet [id number] Yes TAKE 1 TABLET BY MOUTH EVERY DAY Kerri Perches, MD Taking Active Self  aspirin EC 81 MG tablet [id number] Yes Take 81 mg by mouth daily. Swallow whole. [provider] Taking Active Self  cholecalciferol (VITAMIN D3) 25 MCG (1000 UNIT) tablet [id number] Yes Take 1,000 Units by mouth daily. [provider] Taking Active Self  diclofenac Sodium (VOLTAREN) 1 % GEL [id number] Yes APPLY 4 G TOPICALLY 4 (FOUR) TIMES DAILY AS NEEDED (PAIN). Vickki Hearing, MD Taking Active Self   diphenhydramine-acetaminophen (TYLENOL PM) 25-500 MG TABS tablet [id number] Yes Take 2 tablets by mouth at bedtime. [provider] Taking Active Self  Dulaglutide (TRULICITY) 0.75 MG/0.5ML Ivory Broad [id number] Yes INJECT 0.75 MG INTO THE SKIN WEEKLY Kerri Perches, MD Taking Active Self           Med Note Anne Hahn, SANDY B   Thu Aug 29, 2023 10:47 AM) Leanora Ivanoff on Sundays - Last shot was 08/23/2023  ezetimibe (ZETIA) 10 MG tablet [id number] Yes Take 1 tablet (10 mg total) by mouth daily. Kerri Perches, MD Taking Active Self  folic acid (FOLVITE) 1 MG tablet [id number] No Take 1 tablet (1 mg total) by mouth daily.  Patient not taking: Reported on 09/05/2023   Tat, Onalee Hua, MD Not Taking Active Self  gabapentin (NEURONTIN) 300 MG capsule [id number] Yes TAKE 1 CAPSULE BY MOUTH THREE TIMES A DAY AS NEEDED  Patient taking differently: Take 300 mg by mouth 2 (two) times daily.   Vickki Hearing, MD Taking Active Self  glucose blood [facility] ULTRA) test strip [id number] Yes USE AS DIRECTED Kerri Perches, MD Taking Active Self  linagliptin (TRADJENTA) 5 MG TABS tablet [id number] Yes Take 1 tablet (5 mg total) by mouth daily. Kerri Perches, MD Taking Active Self  Magnesium 400 MG CAPS [id number] Yes Take 1 tablet by mouth daily. McKenzie, Mardene Celeste, MD Taking Active Self  mirabegron ER (MYRBETRIQ) 25 MG TB24 tablet [id number] No Take 1 tablet (25 mg total) by mouth daily.  Patient not taking: Reported on 09/05/2023   Malen Gauze, MD Not Taking Active Self  nitroGLYCERIN (NITROSTAT) 0.4 MG SL tablet [id number] Yes PLACE 1 TABLET (0.4 MG TOTAL) UNDER THE TONGUE EVERY 5 (  FIVE) MINUTES AS NEEDED FOR CHEST PAIN. UP TO 3 TABLETS, THEN CALL 911. Ellsworth Lennox, PA-C Taking Active Self  OneTouch Delica Lancets 30G MISC [id number] Yes Once daily testing dx e11.9 Kerri Perches, MD Taking Active Self  pantoprazole (PROTONIX) 40 MG tablet [id number] Yes TAKE 1 TABLET BY MOUTH TWICE A  DAY Kerri Perches, MD Taking Active Self  PARoxetine (PAXIL) 40 MG tablet [id number] Yes Take 1 tablet (40 mg total) by mouth in the morning. Kerri Perches, MD Taking Active Self  rosuvastatin (CRESTOR) 20 MG tablet [id number] Yes TAKE 1 TABLET BY MOUTH EVERY DAY Kerri Perches, MD Taking Active Self  sennosides-docusate sodium (SENOKOT-S) 8.6-50 MG tablet [id number] Yes Take 1 tablet by mouth daily. Anabel Halon, MD Taking Active Self  SYMBICORT 80-4.5 MCG/ACT inhaler [id number] Yes INHALE 2 PUFFS INTO THE LUNGS TWICE A DAY Kerri Perches, MD Taking Active Self  temazepam (RESTORIL) 7.5 MG capsule [id number] No Take 1 capsule (7.5 mg total) by mouth at bedtime as needed for sleep.  Patient not taking: Reported on 09/05/2023   Kerri Perches, MD Not Taking Active Self           Med Note Richardean Canal Sep 05, 2023  9:12 AM) Has not picked up yet  traMADol (ULTRAM) 50 MG tablet [id number] No Take 1 tablet (50 mg total) by mouth every 6 (six) hours as needed.  Patient not taking: Reported on 09/05/2023   Vickki Hearing, MD Not Taking Active Self  UNABLE TO FIND [id number] Yes Diabetic shoes x 1 pair, inserts x 3 pair  DX E11.9 Kerri Perches, MD Taking Active Self  vitamin B-12 (VITAMIN B12) 500 MCG tablet [id number] No Take 1 tablet (500 mcg total) by mouth daily.  Patient not taking: Reported on 09/05/2023   Tat, Onalee Hua, MD Not Taking Active Self            Home Care and Equipment/Supplies: Were Home Health Services Ordered?: NA Any new equipment or medical supplies ordered?: NA  Functional Questionnaire: Do you need assistance with bathing/showering or dressing?: No Do you need assistance with meal preparation?: No Do you need assistance with eating?: No Do you have difficulty maintaining continence: No Do you need assistance with getting out of bed/getting out of a chair/moving?: No Do you have difficulty managing or taking your medications?:  No  Follow up appointments reviewed: PCP Follow-up appointment confirmed?: NA Specialist Hospital Follow-up appointment confirmed?: NA Do you need transportation to your follow-up appointment?: No Do you understand care options if your condition(s) worsen?: Yes-patient verbalized understanding    SIGNATURE Kandis Fantasia, LPN [facility] Advisor [place] [facility] You Are. We Are. [facility] Direct Dial [phone]

## 2023-09-06 ENCOUNTER — Encounter (HOSPITAL_COMMUNITY)
Admission: RE | Admit: 2023-09-06 | Discharge: 2023-09-06 | Disposition: A | Discharge status: Home / Self Care | Source: Ambulatory Visit | Attending: General Surgery | Admitting: General Surgery

## 2023-09-06 ENCOUNTER — Encounter (HOSPITAL_COMMUNITY): Payer: Self-pay

## 2023-09-06 DIAGNOSIS — Z01818 Encounter for other preprocedural examination: Secondary | ICD-10-CM | POA: Diagnosis not present

## 2023-09-10 ENCOUNTER — Encounter (HOSPITAL_COMMUNITY): Payer: Self-pay

## 2023-09-10 ENCOUNTER — Other Ambulatory Visit (HOSPITAL_COMMUNITY): Payer: Self-pay | Admitting: General Surgery

## 2023-09-10 ENCOUNTER — Ambulatory Visit (HOSPITAL_COMMUNITY)
Admission: RE | Admit: 2023-09-10 | Discharge: 2023-09-10 | Disposition: A | Discharge status: Home / Self Care | Source: Ambulatory Visit | Attending: General Surgery | Admitting: General Surgery

## 2023-09-10 ENCOUNTER — Telehealth: Payer: Self-pay | Admitting: *Deleted

## 2023-09-10 DIAGNOSIS — C50911 Malignant neoplasm of unspecified site of right female breast: Secondary | ICD-10-CM

## 2023-09-10 DIAGNOSIS — C50811 Malignant neoplasm of overlapping sites of right female breast: Secondary | ICD-10-CM | POA: Diagnosis not present

## 2023-09-10 DIAGNOSIS — N644 Mastodynia: Secondary | ICD-10-CM | POA: Diagnosis not present

## 2023-09-10 MED ORDER — LIDOCAINE HCL (PF) 2 % IJ SOLN
INTRAMUSCULAR | Status: AC
  Filled 2023-09-10: qty 10

## 2023-09-10 NOTE — Telephone Encounter (Signed)
 Received return call from patient daughter, Corrie Dandy [phone]~ telephone.   Reports that patient would like to proceed with treatment instead of excision at this time.   Offered appointment with provider to discuss options with patient and daughter.

## 2023-09-10 NOTE — Telephone Encounter (Signed)
 Received call from [facility]. Reports that patient was unable to obtain radiofrequency tag for upcoming surgery.   Dr. Lovell Sheehan made aware and advised to cancel upcoming surgery. Requested to have patient return to office for visit to discuss next steps.   Call placed to patient. LMTRC.

## 2023-09-10 NOTE — Progress Notes (Signed)
 The patient's anxiety was currently interfering with the physician's ability to safely proceed with the breast tag placement today. General surgeon (Dr. Lovell Sheehan) made aware and will make an in person follow up visit at his office to discuss other options. Patient verbalized understanding of why procedure could not be attempted again today and understood she would be following up with the surgeon in the office to discuss other options for right breast tag placement if she wishes to proceed with surgery. Patient left via wheelchair with family with no acute distress noted.

## 2023-09-11 ENCOUNTER — Ambulatory Visit (HOSPITAL_COMMUNITY): Admission: RE | Admit: 2023-09-11 | Source: Home / Self Care | Admitting: General Surgery

## 2023-09-11 ENCOUNTER — Encounter (HOSPITAL_COMMUNITY)

## 2023-09-11 ENCOUNTER — Encounter (HOSPITAL_COMMUNITY): Admission: RE | Payer: Self-pay | Source: Home / Self Care

## 2023-09-11 SURGERY — BREAST LUMPECTOMY WITH RADIO FREQUENCY LOCALIZER
Anesthesia: General | Site: Breast | Laterality: Right

## 2023-09-12 ENCOUNTER — Encounter: Payer: Self-pay | Admitting: General Surgery

## 2023-09-12 ENCOUNTER — Ambulatory Visit (INDEPENDENT_AMBULATORY_CARE_PROVIDER_SITE_OTHER): Admitting: General Surgery

## 2023-09-12 VITALS — BP 108/69 | HR 110 | Temp 98.3°F | Resp 16 | Ht 66.0 in | Wt 179.0 lb

## 2023-09-12 DIAGNOSIS — C50919 Malignant neoplasm of unspecified site of unspecified female breast: Secondary | ICD-10-CM | POA: Diagnosis not present

## 2023-09-12 NOTE — Progress Notes (Signed)
 Patient was unable to tolerate radiofrequency tag placement and mammography.  She states it was too painful and she was tired of being poked and prodded.  She would like to just do hormonal therapy at the present time.  She will call Dr. Marice Potter office for a follow-up appointment.  I told her that if she changed her mind, I could try to give her an anxiolytic prior to the procedure.  Will follow expectantly with Dr. Ellin Saba.

## 2023-09-14 ENCOUNTER — Other Ambulatory Visit: Payer: Self-pay | Admitting: Family Medicine

## 2023-09-15 ENCOUNTER — Other Ambulatory Visit: Payer: Self-pay | Admitting: Orthopedic Surgery

## 2023-09-15 DIAGNOSIS — M25512 Pain in left shoulder: Secondary | ICD-10-CM

## 2023-09-17 NOTE — Progress Notes (Signed)
 [facility] [address], [place] [address]    Clinic Day:  09/18/2023  Referring physician: Kerri Perches, MD  Patient Care Team: Kerri Perches, MD as PCP - General (Family Medicine) Wyline Mood Dorothe Pea, MD as PCP - Cardiology (Cardiology) West Bali, MD (Inactive) (Gastroenterology) Bjorn Pippin, MD as Attending Physician (Urology) York Spaniel, MD (Inactive) (Neurology) Marcine Matar, MD as Consulting Physician (Urology) Jethro Bolus, MD as Consulting Physician (Ophthalmology) Doreatha Massed, MD as Medical Oncologist (Medical Oncology) Therese Sarah, RN as Oncology Nurse Navigator (Medical Oncology)   ASSESSMENT & PLAN:   Assessment: 1.  Stage I (T1a N0 M0 G2) right breast UOQ invasive lobular carcinoma: - Abnormal screening mammogram on 06/28/2023 - Right breast diagnostic mammogram/ultrasound: Oval hypoechoic mass with slight margin irregularity in the right breast at 9 o'clock position, 8 cm from nipple measuring 0.3 x 0.3 x 0.3 cm.  Normal lymph nodes in the right axilla. - Right breast 9:00 biopsy (08/20/2023): Grade 2 invasive lobular carcinoma, E-cadherin negative.  Associated DCIS.  ER 60% positive, weak staining, PR 70% positive, weak to moderate staining, HER2 (0), Ki-67 <1%.   2.  Social/family history: - She lives by herself at home and is independent of ADLs and IADLs.  Quit smoking 30 years ago. - 2 maternal first cousins had breast cancer.  Niece had breast cancer.  Maternal aunt had "cancer in the stomach".   3.  IgG kappa smoldering myeloma: - Diagnosed in September 2018 after nephrology work-up found M spike in urine (8.3 mg per 24 hours) and serum (1.5 g/dL) - Immunofixation shows IgG monoclonal protein with kappa light chain specificity - Bone marrow biopsy on 02/19/2017 showed trilineage hematopoiesis, plasma cells 14%, cytogenetics normal, FISH panel normal - Skeletal survey on 02/01/2020 did not show  any lytic lesions. - Skeletal survey (02/16/2021): 2 adjacent 4 mm lucencies on the right scapula at the base of the glenoid, which were not seen on prior exam.  No other focal bone lesion. - Scapula x-ray (02/21/2021): Small lucent lesions in the scapula at the base of the glenoid, better seen on skeletal survey - Skeletal survey (11/29/2021): Stable lucencies in scapula on the right, no new lytic or destructive lesion is seen.    Plan: 1.  Stage I (T1a N0 M0 G2 ER/PR+, HER2-) right breast UOQ invasive lobular carcinoma: - She was evaluated by breast radiology and Dr. Lovell Sheehan.  She was unable to tolerate radiofrequency tag placement and mammography.  Hence she is not moving forward with surgery. - We discussed role of antiestrogen therapy to control her cancer and possibly shrink it.  She will then be reevaluated for surgery.  We talked about starting her on anastrozole 1 mg daily.  We discussed side effects including vasomotor symptoms, musculoskeletal symptoms among others. - We have sent prescription anastrozole to her pharmacy.  She will come back in 3 months to see how she is tolerating it.  We may repeat mammogram and ultrasound in 6 months to evaluate response.   2.  IgG kappa smoldering myeloma: - Reviewed labs from 08/26/2023.  M spike is stable at 1.6 g.  Creatinine and calcium are at her baseline.  Hemoglobin is 11.5. - Skeletal survey on 08/26/2023 shows faint small rounded lucencies in the right humeral neck, not seen on prior study.  Stable nonspecific small lucencies within the right scapula at the base of the glenoid, equivocal. - I have recommended that we follow-up on right  humeral lesions with repeat x-ray in 6 months.   3.  Anemia from iron deficiency: - Reviewed labs from 08/26/2023.  Ferritin is low at 5.  B12 and folic acid normal.  Hemoglobin 11.5. - Recommend INFeD 1 g IV x 1.  Will repeat CBC, ferritin and iron panel in 3 months.  4.  Osteopenia: - DEXA scan on 03/30/2022:  T-score -2.0. - Will closely monitor and repeat DEXA scan towards end of the year.  Will also check vitamin D levels at some point.    Orders Placed This Encounter  Procedures   CBC with Differential    Standing Status:   Future    Expected Date:   12/16/2023    Expiration Date:   09/17/2024   Iron and TIBC ([facility] ONLY)    Standing Status:   Future    Expected Date:   12/16/2023    Expiration Date:   09/17/2024   Ferritin    Standing Status:   Future    Expected Date:   12/16/2023    Expiration Date:   09/17/2024      I,Katie Daubenspeck,acting as a scribe for Doreatha Massed, MD.,have documented all relevant documentation on the behalf of Doreatha Massed, MD,as directed by  Doreatha Massed, MD while in the presence of Doreatha Massed, MD.   I, Doreatha Massed MD, have reviewed the above documentation for accuracy and completeness, and I agree with the above.   Doreatha Massed, MD   4/9/202511:11 AM  CHIEF COMPLAINT:   Diagnosis: lobular right breast cancer; history of IgG kappa smoldering myeloma & iron deficiency anemia    Cancer Staging  Breast cancer of upper-outer quadrant of right female breast [facility]) Staging form: Breast, AJCC 8th Edition - Clinical stage from 08/26/2023: Stage IA (cT1a, cN0, cM0, G2, ER+, PR+, HER2-) - Unsigned    Prior Therapy: Intermittent IV iron infusions (last in July 2023)   Current Therapy: Anastrozole   HISTORY OF PRESENT ILLNESS:   Oncology History   No history exists.     INTERVAL HISTORY:   Cheryl Abbott is a 78 y.o. female presenting to clinic today for follow up of lobular right breast cancer; history of IgG kappa smoldering myeloma & iron deficiency anemia. She was last seen by me on 08/26/23.  Today, she states that she is doing well overall. Her appetite level is at 50%. Her energy level is at 25%.  PAST MEDICAL HISTORY:   Past Medical History: Past Medical History:  Diagnosis Date    Anxiety disorder    Arthritis    Chronic back pain    Chronic pain of both shoulders    per pt told due to cervical spine pinched nerve   CKD (chronic kidney disease), stage III [facility])    nephrologist--- dr Wolfgang Phoenix;   due to tubular necrosis   Claustrophobia    Depression    Edema of both lower extremities    GERD (gastroesophageal reflux disease)    Headache    Hemorrhoids    w/ intermittant bleeding   History of adenomatous polyp of colon    History of kidney stones    Hx-TIA (transient ischemic attack) 02/26/2016   per pt no residual   Hydronephrosis, left    followed by urologist--- dr Retta Diones;   persistant   Hyperlipidemia    Hypertension    Iron deficiency    followed by [facility] w/ hx iron infusions   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   oncologist--- [facility];  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, active survillence   Lactose intolerance in adult 02/01/2016   Left ureteral calculus    Lumbar disc disease with radiculopathy    Nephrolithiasis    per CT 08-11-2021  left > right   Neuropathy, peripheral    Nocturia more than twice per night    Normocytic anemia    OSA (obstructive sleep apnea)    per study 2007;   per pt no Bipap use since 2021   Renal atrophy, bilateral    Renal cyst, right    Sciatica    Sigmoid diverticulosis    Type 2 diabetes mellitus (HCC)     Surgical History: Past Surgical History:  Procedure Laterality Date   BIOPSY N/A 03/17/2013   Procedure: GASTRIC BIOPSIES;  Surgeon: West Bali, MD;  Location: [facility];  Service: Endoscopy;  Laterality: N/A;   BIOPSY  06/10/2015   Procedure: BIOPSY;  Surgeon: West Bali, MD;  [facility];  Service: Endoscopy;;  gastric biopsy   BREAST BIOPSY Right 08/20/2023   path pending   BREAST BIOPSY Right 08/20/2023   [place] RT BREAST BX W LOC DEV 1ST LESION IMG BX SPEC US GUIDE 08/20/2023 Edwin Cap, MD [facility]-UL[facility]ASOUND   CARDIAC CATHETERIZATION  05/11/2003    @ [facility] by dr t. Tresa Endo;  Abnormal cardiolite/   Normal coronary arteries and normal LVF,  ef  63%   CARDIOVASCULAR STRESS TEST  01/01/2014   normal lexiscan cardiolite/  no ischemia/ infarct/  normal LV function and wall motion ,  ef 81%   COLONOSCOPY WITH PROPOFOL N/A 02/26/2017   Procedure: COLONOSCOPY WITH PROPOFOL;  Surgeon: West Bali, MD;  [facility];  Service: Endoscopy;  Laterality: N/A;  11:00am   COLONOSCOPY WITH PROPOFOL N/A 09/22/2019   Procedure: COLONOSCOPY WITH PROPOFOL;  Surgeon: West Bali, MD;  [facility]: [facility];  Service: Endoscopy;  Laterality: N/A;  1:15   CYSTO/   RIGHT URETEROSOCOPY LASER LITHOTRIPSY STONE EXTRACTION  10/13/2004   CYSTO/  RIGHT RETROGRADE PYELOGRAM/  PLACMENT RIGHT URETERAL STENT  01/10/2010   CYSTOSCOPY W/ URETERAL STENT PLACEMENT Right 08/19/2015   Procedure: CYSTOSCOPY WITH RETROGRADE PYELOGRAM/URETERAL STENT PLACEMENT;  Surgeon: Marcine Matar, MD;  Location: [facility];  Service: Urology;  Laterality: Right;   CYSTOSCOPY W/ URETERAL STENT PLACEMENT Left 02/23/2020   Procedure: CYSTOSCOPY LEFT  RETROGRADE PYELOGRAM LEFT URETEROSCOPY URETERAL STENT PLACEMENT;  Surgeon: Marcine Matar, MD;  Location: [facility];  Service: Urology;  Laterality: Left;   CYSTOSCOPY W/ URETERAL STENT PLACEMENT Right 09/24/2022   Procedure: CYSTOSCOPY WITH RETROGRADE PYELOGRAM/URETERAL STENT PLACEMENT;  Surgeon: Malen Gauze, MD;  Location: [facility];  Service: Urology;  Laterality: Right;   CYSTOSCOPY W/ URETERAL STENT PLACEMENT Right 10/25/2022   Procedure: RIGHT URETERAL STENT EXCHANGE;  Surgeon: Malen Gauze, MD;  Location: [facility];  Service: Urology;  Laterality: Right;   CYSTOSCOPY WITH RETROGRADE PYELOGRAM, URETEROSCOPY AND STENT PLACEMENT Left 10/21/2014   Procedure: CYSTOSCOPY WITH RETROGRADE PYELOGRAM, URETEROSCOPY AND STENT PLACEMENT;  Surgeon: Marcine Matar, MD;  Location: [facility];  Service: Urology;  Laterality: Left;   CYSTOSCOPY WITH RETROGRADE PYELOGRAM, URETEROSCOPY AND STENT PLACEMENT Right 11/22/2015   Procedure: CYSTOSCOPY, RIGHT URETERAL STENT REMOVAL; RIGHT RETROGRADE PYELOGRAM, RIGHT URETEROSCOPY WITH BALLOON DILATION; RIGHT URETERAL STENT PLACEMENT;  Surgeon: Marcine Matar, MD;  [facility];  Service: Urology;  Laterality: Right;   CYSTOSCOPY WITH RETROGRADE PYELOGRAM, URETEROSCOPY AND STENT  PLACEMENT Left 10/18/2020   Procedure: CYSTOSCOPY WITH LEFT RETROGRADE PYELOGRAM, LEFT URETEROSCOPY  WITH LASER AND LEFT URETERAL STENT PLACEMENT;  Surgeon: Marcine Matar, MD;  [facility];  Service: Urology;  Laterality: Left;   CYSTOSCOPY WITH RETROGRADE PYELOGRAM, URETEROSCOPY AND STENT PLACEMENT Left 10/26/2021   Procedure: CYSTOSCOPY WITH RETROGRADE PYELOGRAM, URETEROSCOPY, STONE EXTRACTION  AND STENT PLACEMENT;  Surgeon: Marcine Matar, MD;  Location[facility];  Service: Urology;  Laterality: Left;   CYSTOSCOPY WITH RETROGRADE PYELOGRAM, URETEROSCOPY AND STENT PLACEMENT Bilateral 10/25/2022   Procedure: CYSTOSCOPY WITH RETROGRADE PYELOGRAM, URETEROSCOPY;  Surgeon: Malen Gauze, MD;  [facility]: [facility];  Service: Urology;  Laterality: Bilateral;   CYSTOSCOPY/RETROGRADE/URETEROSCOPY/STONE EXTRACTION WITH BASKET Bilateral 08/02/2015   Procedure: CYSTOSCOPY; BILATERAL RETROGRADE PYELOGRAMS; BILATERAL URETEROSCOPY, STONE EXTRACTION WITH BASKET;  Surgeon: Marcine Matar, MD;  [facility]: [facility];  Service: Urology;  Laterality: Bilateral;   CYSTOSCOPY/RETROGRADE/URETEROSCOPY/STONE EXTRACTION WITH BASKET Right 06/28/2015   Procedure: CYSTOSCOPY/RETROGRADE/URETEROSCOPY/STONE EXTRACTION WITH BASKET, RIGHT URETERAL DOUBLE J STENT PLACEMENT;  Surgeon: Marcine Matar, MD;  [facility]: [facility];  Service: Urology;  Laterality: Right;   ESOPHAGOGASTRODUODENOSCOPY (EGD) WITH PROPOFOL N/A 03/17/2013   Procedure: ESOPHAGOGASTRODUODENOSCOPY  (EGD) WITH PROPOFOL;  Surgeon: West Bali, MD;  Location: [facility];  Service: Endoscopy;  Laterality: N/A;   ESOPHAGOGASTRODUODENOSCOPY (EGD) WITH PROPOFOL N/A 06/10/2015   Distal gastritis, distal esophageal stricture s/p dilation   ESOPHAGOGASTRODUODENOSCOPY (EGD) WITH PROPOFOL N/A 02/26/2017   Procedure: ESOPHAGOGASTRODUODENOSCOPY (EGD) WITH PROPOFOL;  Surgeon: West Bali, MD;  [facility];  Service: Endoscopy;  Laterality: N/A;   FLEXIBLE SIGMOIDOSCOPY  09/11/2011   [id number] Internal hemorrhoids   HOLMIUM LASER APPLICATION Left 10/21/2014   Procedure: HOLMIUM LASER APPLICATION;  Surgeon: Marcine Matar, MD;  Location: [facility];  Service: Urology;  Laterality: Left;   HOLMIUM LASER APPLICATION Right 06/28/2015   Procedure: HOLMIUM LASER APPLICATION;  Surgeon: Marcine Matar, MD;  [facility];  Service: Urology;  Laterality: Right;   HOLMIUM LASER APPLICATION  02/23/2020   Procedure: HOLMIUM LASER APPLICATION;  Surgeon: Marcine Matar, MD;  [facility];  Service: Urology;;   HOLMIUM LASER APPLICATION Left 10/18/2020   Procedure: HOLMIUM LASER APPLICATION;  Surgeon: Marcine Matar, MD;  [facility]: [facility];  Service: Urology;  Laterality: Left;   HOLMIUM LASER APPLICATION Right 10/25/2022   Procedure: HOLMIUM LASER APPLICATION;  Surgeon: Malen Gauze, MD;  [facility]: [facility];  Service: Urology;  Laterality: Right;   MASS EXCISION Left 03/04/2015   Procedure: EXCISION OF SOFT TISSUE NEOPLASM LEFT ARM;  Surgeon: Franky Macho, MD;  Location: [facility];  Service: General;  Laterality: Left;   PERCUTANEOUS NEPHROSTOLITHOTOMY Bilateral 05/2010   @[facility]    POLYPECTOMY N/A 03/17/2013   Procedure: GASTRIC POLYPECTOMY;  Surgeon: West Bali, MD;  Location[facility];  Service: Endoscopy;  Laterality: N/A;   POLYPECTOMY  02/26/2017   Procedure: POLYPECTOMY;  Surgeon: West Bali, MD;  [facility];  Service: Endoscopy;;   polyp at cecum, ascending colon polyps x3, hepatic flexure polyps x8, transverse colon polyps x8    POLYPECTOMY  09/22/2019   Procedure: POLYPECTOMY;  Surgeon: West Bali, MD;  [facility];  Service: Endoscopy;;   REMOVAL RIGHT THIGH CYST  2006   SAVORY DILATION N/A 03/17/2013   Procedure: SAVORY DILATION;  Surgeon: West Bali, MD;  Location: [facility];  Service: Endoscopy;  Laterality: N/A;  #12.8, 14, 15, 16 dilators used   SAVORY DILATION N/A 06/10/2015   Procedure:  SAVORY DILATION;  Surgeon: West Bali, MD;  [facility];  Service: Endoscopy;  Laterality: N/A;   VAGINAL HYSTERECTOMY  1970's    Social History: Social History   Socioeconomic History   Marital status: Divorced    Spouse name: Not on file   Number of children: 2   Years of education: 12   Highest education level: 12th grade  Occupational History   Occupation: retired   Tobacco Use   Smoking status: Former    Current packs/day: 0.00    Average packs/day: 1 pack/day for 20.0 years (20.0 ttl pk-yrs)    Types: Cigarettes    Start date: 09/21/1974    Quit date: 09/20/1988    Years since quitting: 35.0    Passive exposure: Past   Smokeless tobacco: Never  Vaping Use   Vaping status: Never Used  Substance and Sexual Activity   Alcohol use: No    Alcohol/week: 0.0 standard drinks of alcohol   Drug use: No   Sexual activity: Not Currently    Partners: Male    Birth control/protection: Surgical  Other Topics Concern   Not on file  Social History Narrative   Patient is right handed   Patient drinks some caffeine daily.   Social Drivers of Corporate investment banker Strain: Low Risk  (10/24/2022)   Overall Financial Resource Strain (CARDIA)    Difficulty of Paying Living Expenses: Not very hard  Food Insecurity: No Food Insecurity (10/24/2022)   Hunger Vital Sign    Worried About Running Out of Food in the Last Year: Never true    Ran Out of Food in the Last Year: Never true   Transportation Needs: No Transportation Needs (10/24/2022)   PRAPARE - Administrator, Civil Service (Medical): No    Lack of Transportation (Non-Medical): No  Physical Activity: Inactive (07/06/2022)   Exercise Vital Sign    Days of Exercise per Week: 0 days    Minutes of Exercise per Session: 0 min  Stress: No Stress Concern Present (07/06/2022)   [employer] of Occupational Health - Occupational Stress Questionnaire    Feeling of Stress : Not at all  Social Connections: Moderately Isolated (07/06/2022)   Social Connection and Isolation Panel [NHANES]    Frequency of Communication with Friends and Family: More than three times a week    Frequency of Social Gatherings with Friends and Family: Once a week    Attends Religious Services: More than 4 times per year    Active Member of [employer] or Organizations: No    Attends Banker Meetings: Never    Marital Status: Divorced  Catering manager Violence: Not At Risk (10/17/2022)   Humiliation, Afraid, Rape, and Kick questionnaire    Fear of Current or Ex-Partner: No    Emotionally Abused: No    Physically Abused: No    Sexually Abused: No    Family History: Family History  Problem Relation Age of Onset   Hypertension Mother    Diabetes Mother    Heart failure Mother    Dementia Mother    Emphysema Father    Hypertension Father    Diabetes Brother    GER disease Brother    Hypertension Sister    Hypertension Sister    Cancer Other        family history    Diabetes Other        family history    Heart defect Other  famiily history    Arthritis Other        family history    Anesthesia problems Neg Hx    Hypotension Neg Hx    Malignant hyperthermia Neg Hx    Pseudochol deficiency Neg Hx    Colon cancer Neg Hx     Current Medications:  Current Outpatient Medications:    acetaminophen (TYLENOL) 500 MG tablet, Take 2 tablets (1,000 mg total) by mouth at bedtime. (Patient taking differently:  Take 1,000 mg by mouth every 6 (six) hours as needed for moderate pain (pain score 4-6).), Disp: , Rfl:    amLODipine (NORVASC) 10 MG tablet, TAKE 1 TABLET BY MOUTH EVERY DAY, Disp: 90 tablet, Rfl: 1   anastrozole (ARIMIDEX) 1 MG tablet, Take 1 tablet (1 mg total) by mouth daily., Disp: 90 tablet, Rfl: 3   aspirin EC 81 MG tablet, Take 81 mg by mouth daily. Swallow whole., Disp: , Rfl:    cholecalciferol (VITAMIN D3) 25 MCG (1000 UNIT) tablet, Take 1,000 Units by mouth daily., Disp: , Rfl:    diclofenac Sodium (VOLTAREN) 1 % GEL, APPLY 4 G TOPICALLY 4 (FOUR) TIMES DAILY AS NEEDED (PAIN)., Disp: 400 g, Rfl: 5   diphenhydramine-acetaminophen (TYLENOL PM) 25-500 MG TABS tablet, Take 2 tablets by mouth at bedtime., Disp: , Rfl:    Dulaglutide (TRULICITY) 0.75 MG/0.5ML SOAJ, INJECT 0.75 MG INTO THE SKIN WEEKLY, Disp: 2 mL, Rfl: 1   ezetimibe (ZETIA) 10 MG tablet, TAKE 1 TABLET BY MOUTH EVERY DAY, Disp: 90 tablet, Rfl: 1   folic acid (FOLVITE) 1 MG tablet, Take 1 tablet (1 mg total) by mouth daily., Disp: , Rfl:    gabapentin (NEURONTIN) 300 MG capsule, TAKE 1 CAPSULE BY MOUTH THREE TIMES A DAY AS NEEDED (Patient taking differently: Take 300 mg by mouth 2 (two) times daily.), Disp: 90 capsule, Rfl: 5   glucose blood (ONETOUCH ULTRA) test strip, USE AS DIRECTED, Disp: 100 strip, Rfl: 5   linagliptin (TRADJENTA) 5 MG TABS tablet, Take 1 tablet (5 mg total) by mouth daily., Disp: , Rfl:    Magnesium 400 MG CAPS, Take 1 tablet by mouth daily., Disp: 30 capsule, Rfl: 11   mirabegron ER (MYRBETRIQ) 25 MG TB24 tablet, Take 1 tablet (25 mg total) by mouth daily., Disp: , Rfl:    OneTouch Delica Lancets 30G MISC, Once daily testing dx e11.9, Disp: 100 each, Rfl: 5   pantoprazole (PROTONIX) 40 MG tablet, TAKE 1 TABLET BY MOUTH TWICE A DAY, Disp: 180 tablet, Rfl: 1   PARoxetine (PAXIL) 40 MG tablet, Take 1 tablet (40 mg total) by mouth in the morning., Disp: 90 tablet, Rfl: 1   rosuvastatin (CRESTOR) 20 MG tablet,  TAKE 1 TABLET BY MOUTH EVERY DAY, Disp: 90 tablet, Rfl: 3   sennosides-docusate sodium (SENOKOT-S) 8.6-50 MG tablet, Take 1 tablet by mouth daily., Disp: 30 tablet, Rfl: 2   SYMBICORT 80-4.5 MCG/ACT inhaler, INHALE 2 PUFFS INTO THE LUNGS TWICE A DAY, Disp: 30.6 each, Rfl: 4   temazepam (RESTORIL) 7.5 MG capsule, Take 1 capsule (7.5 mg total) by mouth at bedtime as needed for sleep., Disp: 30 capsule, Rfl: 2   traMADol (ULTRAM) 50 MG tablet, TAKE 1 TABLET BY MOUTH EVERY 6 HOURS AS NEEDED., Disp: 60 tablet, Rfl: 0   UNABLE TO FIND, Diabetic shoes x 1 pair, inserts x 3 pair  DX E11.9, Disp: 1 each, Rfl: 0   vitamin B-12 (VITAMIN B12) 500 MCG tablet, Take 1 tablet (500 mcg total)  by mouth daily., Disp: , Rfl:    nitroGLYCERIN (NITROSTAT) 0.4 MG SL tablet, PLACE 1 TABLET (0.4 MG TOTAL) UNDER THE TONGUE EVERY 5 (FIVE) MINUTES AS NEEDED FOR CHEST PAIN. UP TO 3 TABLETS, THEN CALL 911., Disp: 25 tablet, Rfl: 3   Allergies: Allergies  Allergen Reactions   Ace Inhibitors Cough    Patient doesn't recall  Other Reaction(s): GI Intolerance   Cephalexin Diarrhea and Nausea And Vomiting    Other Reaction(s): GI Intolerance   Nitrofurantoin Diarrhea and Nausea And Vomiting   Penicillins Hives and Other (See Comments)    REVIEW OF SYSTEMS:   Review of Systems  Constitutional:  Negative for chills, fatigue and fever.  HENT:   Negative for lump/mass, mouth sores, nosebleeds, sore throat and trouble swallowing.   Eyes:  Negative for eye problems.  Respiratory:  Negative for cough and shortness of breath.   Cardiovascular:  Negative for leg swelling and palpitations.  Gastrointestinal:  Positive for constipation and nausea. Negative for abdominal pain, diarrhea and vomiting.  Genitourinary:  Negative for bladder incontinence, difficulty urinating, dysuria, frequency, hematuria and nocturia.   Musculoskeletal:  Negative for arthralgias, back pain, flank pain, myalgias and neck pain.  Skin:  Negative for  itching and rash.  Neurological:  Positive for dizziness. Negative for headaches and numbness.  Hematological:  Does not bruise/bleed easily.  Psychiatric/Behavioral:  Positive for depression. Negative for sleep disturbance and suicidal ideas. The patient is nervous/anxious.   All other systems reviewed and are negative.    VITALS:   Blood pressure 104/83, pulse 94, temperature 98 F (36.7 C), temperature source Tympanic, weight 180 lb 5.4 oz (81.8 kg), SpO2 94%.  Wt Readings from Last 3 Encounters:  09/18/23 180 lb 5.4 oz (81.8 kg)  09/12/23 179 lb (81.2 kg)  09/06/23 181 lb (82.1 kg)    Body mass index is 29.11 kg/m.  Performance status (ECOG): 1 - Symptomatic but completely ambulatory  PHYSICAL EXAM:   Physical Exam Vitals and nursing note reviewed. Exam conducted with a chaperone present.  Constitutional:      Appearance: Normal appearance.  Cardiovascular:     Rate and Rhythm: Normal rate and regular rhythm.     Pulses: Normal pulses.     Heart sounds: Normal heart sounds.  Pulmonary:     Effort: Pulmonary effort is normal.     Breath sounds: Normal breath sounds.  Abdominal:     Palpations: Abdomen is soft. There is no hepatomegaly, splenomegaly or mass.     Tenderness: There is no abdominal tenderness.  Musculoskeletal:     Right lower leg: No edema.     Left lower leg: No edema.  Lymphadenopathy:     Cervical: No cervical adenopathy.     Right cervical: No superficial, deep or posterior cervical adenopathy.    Left cervical: No superficial, deep or posterior cervical adenopathy.     Upper Body:     Right upper body: No supraclavicular or axillary adenopathy.     Left upper body: No supraclavicular or axillary adenopathy.  Neurological:     General: No focal deficit present.     Mental Status: She is alert and oriented to person, place, and time.  Psychiatric:        Mood and Affect: Mood normal.        Behavior: Behavior normal.     LABS:   CBC      Component Value Date/Time   WBC 8.2 09/04/2023 0230   RBC 3.81 (  L) 09/04/2023 0230   HGB 10.6 (L) 09/04/2023 0230   HGB 10.3 (L) 04/25/2023 1648   HCT 34.2 (L) 09/04/2023 0230   HCT 33.0 (L) 04/25/2023 1648   PLT 305 09/04/2023 0230   PLT 395 04/25/2023 1648   MCV 89.8 09/04/2023 0230   MCV 92 04/25/2023 1648   MCH 27.8 09/04/2023 0230   MCHC 31.0 09/04/2023 0230   RDW 19.6 (H) 09/04/2023 0230   RDW 18.1 (H) 04/25/2023 1648   LYMPHSABS 1.6 09/04/2023 0230   LYMPHSABS 1.6 10/11/2022 1109   MONOABS 1.0 09/04/2023 0230   EOSABS 0.2 09/04/2023 0230   EOSABS 0.3 10/11/2022 1109   BASOSABS 0.1 09/04/2023 0230   BASOSABS 0.1 10/11/2022 1109    CMP      Component Value Date/Time   NA 137 09/04/2023 0230   NA 142 08/05/2023 0909   K 3.7 09/04/2023 0230   CL 104 09/04/2023 0230   CO2 24 09/04/2023 0230   GLUCOSE 111 (H) 09/04/2023 0230   BUN 18 09/04/2023 0230   BUN 14 08/05/2023 0909   CREATININE 1.52 (H) 09/04/2023 0230   CREATININE 1.53 (H) 11/18/2019 0910   CALCIUM 9.8 09/04/2023 0230   CALCIUM 10.5 11/28/2010 1057   PROT 9.1 (H) 08/26/2023 0855   PROT 8.0 08/05/2023 0909   ALBUMIN 4.1 08/26/2023 0855   ALBUMIN 4.3 08/05/2023 0909   AST 17 08/26/2023 0855   ALT 13 08/26/2023 0855   ALKPHOS 77 08/26/2023 0855   BILITOT 0.4 08/26/2023 0855   BILITOT 0.2 08/05/2023 0909   GFRNONAA 35 (L) 09/04/2023 0230   GFRNONAA 33 (L) 11/18/2019 0910   GFRAA 41 (L) 05/13/2020 0946   GFRAA 38 (L) 11/18/2019 0910     No results found for: "CEA1", "CEA" / No results found for: "CEA1", "CEA" No results found for: "PSA1" No results found for: "ZOX096" No results found for: "EAV409"  Lab Results  Component Value Date   TOTALPROTELP 8.7 (H) 08/26/2023   ALBUMINELP 4.0 08/26/2023   A1GS 0.2 08/26/2023   A2GS 1.1 (H) 08/26/2023   BETS 1.3 08/26/2023   BETA2SER 6.9 (H) 11/22/2010   GAMS 2.1 (H) 08/26/2023   MSPIKE 1.6 (H) 08/26/2023   SPEI Comment 08/26/2023   Lab Results   Component Value Date   TIBC 514 (H) 08/26/2023   TIBC 465 (H) 06/13/2022   TIBC 351 03/06/2022   FERRITIN 5 (L) 08/26/2023   FERRITIN 12 06/13/2022   FERRITIN 103 03/06/2022   IRONPCTSAT 12 08/26/2023   IRONPCTSAT 11 06/13/2022   IRONPCTSAT 21 03/06/2022   Lab Results  Component Value Date   LDH 101 06/13/2022   LDH 102 03/06/2022   LDH 123 11/29/2021     STUDIES:   [place] LIMITED ULTRASOUND INCLUDING AXILLA RIGHT BREAST Result Date: 09/10/2023 CLINICAL DATA:  78 year old female with 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 Initially, attempt at radiofrequency tag placement under mammography guidance was unsuccessful due to patient movement, anxiety and discomfort with breast compression and lidocaine numbing. The patient was unable to hold still and was not happy being upright for the procedure and therefore it was decided to attempt to find the biopsied mass under ultrasound so that the tag could be placed. EXAM: ULTRASOUND OF THE RIGHT BREAST COMPARISON:  Previous exams. FINDINGS: Targeted ultrasound of the outer right breast was performed. The patient tolerated sonographic evaluation of  the right breast poorly today, moving and shaking and crying as well as turning away from the provider. The mass in the right breast at 9 o'clock 8 cm from nipple was not clearly identified due to small size and likely presence of the biopsy marking clip in the mass. Therefore attempt at ultrasound-guided radiofrequency tag placement was aborted. IMPRESSION: Radiofrequency tag not placed at the site of biopsy proven malignancy in the right breast at the 9 o'clock position. RECOMMENDATION: Recommend the patient receive anti anxiety medication prior to any subsequent attempts at radiofrequency tag placement. All this was discussed with the patient's surgeon, Dr. Lovell Sheehan. Dr.  Lovell Sheehan will bring the patient back to his office to discuss further options. This was relayed to the patient that she would be hearing from Dr. Lovell Sheehan office to discuss next steps. I have discussed the findings and recommendations with the patient. If applicable, a reminder letter will be sent to the patient regarding the next appointment. BI-RADS CATEGORY  6: Known biopsy-proven malignancy. Electronically Signed   By: Edwin Cap M.D.   On: 09/10/2023 10:52   DG Chest Portable 1 View Result Date: 09/04/2023 CLINICAL DATA:  Recent fall EXAM: PORTABLE CHEST 1 VIEW COMPARISON:  09/23/2018 FINDINGS: Cardiac shadow is within normal limits. Elevation of the right hemidiaphragm is noted. Stable right basilar atelectasis is seen. No bony abnormality is noted. IMPRESSION: No acute abnormality noted. Electronically Signed   By: Alcide Clever M.D.   On: 09/04/2023 02:48   DG Hip Unilat W or Wo Pelvis 2-3 Views Left Result Date: 09/04/2023 CLINICAL DATA:  Recent fall with left hip pain, initial encounter EXAM: DG HIP (WITH OR WITHOUT PELVIS) 2-3V LEFT COMPARISON:  None Available. FINDINGS: Pelvic ring is intact. Degenerative changes of the hip joints are seen. Proximal femur appears within normal limits. No acute fracture or dislocation is noted. No soft tissue changes are seen. IMPRESSION: No acute abnormality noted. Electronically Signed   By: Alcide Clever M.D.   On: 09/04/2023 02:48   DG Bone Survey Met Result Date: 08/27/2023 CLINICAL DATA:  Multiple myeloma EXAM: METASTATIC BONE SURVEY COMPARISON:  11/09/2021 FINDINGS: Standard bone survey was performed. Lateral skull: No acute or destructive bony abnormalities. Upper extremities: Frontal views of the upper extremities are obtained from the shoulders through the wrists. The small lucencies within the right scapula at the base of the gland are again seen, nonspecific. Faint small rounded lucencies are seen within the right humeral neck, new since prior  study. No other acute or destructive bony abnormalities. Spine: Frontal and lateral views of the spine are obtained. There are no acute or destructive bony abnormalities. Mild diffuse spondylosis and facet hypertrophy, greatest in the lower lumbar spine. Chest: No acute or destructive bony abnormalities. The lungs are clear. Cardiac silhouette is unremarkable. Pelvis: No acute or destructive bony abnormalities. Lower extremities: Frontal views are obtained from the hips through the ankles. There are no acute or destructive bony abnormalities. Mild degenerative changes of the bilateral knees. IMPRESSION: 1. Faint small rounded lucencies within the right humeral neck, not seen on prior study, which could reflect sequela of myeloma. 2. Stable nonspecific small lucencies within the right scapula at the base of the glenoid, equivocal for myeloma. Electronically Signed   By: Sharlet Salina M.D.   On: 08/27/2023 15:45   [place] RT BREAST BX W LOC DEV 1ST LESION IMG BX SPEC US GUIDE Addendum Date: 08/21/2023 ADDENDUM REPORT: 08/21/2023 15:11 ADDENDUM: PATHOLOGY revealed: A. BREAST, RIGHT, 9 OC MASS,  NEEDLE CORE BIOPSY: - Invasive mammary carcinoma. 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 Overall Grade: 2. Lymphovascular invasion: Not identified. Cancer Length: 0.3 cm. Calcifications: Identified. Pathology results are CONCORDANT with imaging findings, per Dr. Edwin Cap. Pathology results and recommendations were discussed with patient via telephone on 08/21/2023 by Randa Lynn RN. Patient reported biopsy site doing well with no adverse symptoms, and only slight tenderness at the site. Post biopsy care instructions were reviewed, questions were answered and my direct phone number was provided. Patient was instructed to call [facility] Mammography Department for any additional questions or concerns related to biopsy site. RECOMMENDATION: Surgical and  oncological consultation. Request for surgical and oncological consultation was relayed to Ellin Mayhew RT at [facility] Mammography Department by Randa Lynn RN on 08/21/2023. Pathology results reported by Randa Lynn RN on 08/21/2023. Electronically Signed   By: Edwin Cap M.D.   On: 08/21/2023 15:11   Result Date: 08/21/2023 CLINICAL DATA:  Patient presents for ultrasound-guided core biopsy of a 0.3 cm mass in the right breast at 9 o'clock 6 cm from nipple. EXAM: ULTRASOUND GUIDED RIGHT BREAST CORE NEEDLE BIOPSY COMPARISON:  Previous exam(s). PROCEDURE: I met with the patient and we discussed the procedure of ultrasound-guided biopsy, including benefits and alternatives. We discussed the high likelihood of a successful procedure. We discussed the risks of the procedure, including infection, bleeding, tissue injury, clip migration, and inadequate sampling. Informed written consent was given. The usual time-out protocol was performed immediately prior to the procedure. Lesion quadrant: Upper outer Using sterile technique and 1% Lidocaine as local anesthetic, under direct ultrasound visualization, a 14 gauge spring-loaded device was used to perform biopsy of the mass in the right breast at the 9 o'clock position using a lateral to medial approach. At the conclusion of the procedure a ribbon shaped tissue marker clip was deployed into the biopsy cavity. Follow up 2 view mammogram was performed and dictated separately. IMPRESSION: Ultrasound guided biopsy of the mass in the right breast at the 9 o'clock position. No apparent complications. Electronically Signed: By: Edwin Cap M.D. On: 08/20/2023 13:42   MM CLIP PLACEMENT RIGHT Result Date: 08/20/2023 CLINICAL DATA:  Post ultrasound-guided core biopsy of a 0.3 cm mass in the right breast at 9 o'clock 8 cm from nipple. EXAM: 3D DIAGNOSTIC RIGHT MAMMOGRAM POST ULTRASOUND BIOPSY COMPARISON:  Previous exam(s). ACR Breast Density Category b:  There are scattered areas of fibroglandular density. FINDINGS: 3D Mammographic images were obtained following ultrasound-guided core biopsy of a mass in the right breast at the 9 o'clock position. A ribbon shaped biopsy marking clip is present at the site of the biopsied mass in the right breast at the 9 o'clock position. IMPRESSION: Ribbon shaped biopsy marking clip at site of biopsied mass in the right breast at the 9 o'clock position. Final Assessment: Post Procedure Mammograms for Marker Placement Electronically Signed   By: Edwin Cap M.D.   On: 08/20/2023 13:45

## 2023-09-18 ENCOUNTER — Inpatient Hospital Stay: Attending: Hematology | Admitting: Hematology

## 2023-09-18 VITALS — BP 104/83 | HR 94 | Temp 98.0°F | Wt 180.3 lb

## 2023-09-18 DIAGNOSIS — C50411 Malignant neoplasm of upper-outer quadrant of right female breast: Secondary | ICD-10-CM | POA: Diagnosis present

## 2023-09-18 DIAGNOSIS — M47816 Spondylosis without myelopathy or radiculopathy, lumbar region: Secondary | ICD-10-CM | POA: Insufficient documentation

## 2023-09-18 DIAGNOSIS — D509 Iron deficiency anemia, unspecified: Secondary | ICD-10-CM | POA: Diagnosis not present

## 2023-09-18 DIAGNOSIS — Z8673 Personal history of transient ischemic attack (TIA), and cerebral infarction without residual deficits: Secondary | ICD-10-CM | POA: Insufficient documentation

## 2023-09-18 DIAGNOSIS — Z87891 Personal history of nicotine dependence: Secondary | ICD-10-CM | POA: Diagnosis not present

## 2023-09-18 DIAGNOSIS — Z860101 Personal history of adenomatous and serrated colon polyps: Secondary | ICD-10-CM | POA: Diagnosis not present

## 2023-09-18 DIAGNOSIS — I10 Essential (primary) hypertension: Secondary | ICD-10-CM | POA: Insufficient documentation

## 2023-09-18 DIAGNOSIS — E1122 Type 2 diabetes mellitus with diabetic chronic kidney disease: Secondary | ICD-10-CM | POA: Insufficient documentation

## 2023-09-18 DIAGNOSIS — Z79899 Other long term (current) drug therapy: Secondary | ICD-10-CM | POA: Diagnosis not present

## 2023-09-18 DIAGNOSIS — Z79811 Long term (current) use of aromatase inhibitors: Secondary | ICD-10-CM | POA: Diagnosis not present

## 2023-09-18 DIAGNOSIS — Z7985 Long-term (current) use of injectable non-insulin antidiabetic drugs: Secondary | ICD-10-CM | POA: Diagnosis not present

## 2023-09-18 DIAGNOSIS — Z170 Estrogen receptor positive status [ER+]: Secondary | ICD-10-CM | POA: Diagnosis not present

## 2023-09-18 DIAGNOSIS — C9000 Multiple myeloma not having achieved remission: Secondary | ICD-10-CM | POA: Insufficient documentation

## 2023-09-18 DIAGNOSIS — R0902 Hypoxemia: Secondary | ICD-10-CM | POA: Diagnosis not present

## 2023-09-18 DIAGNOSIS — I129 Hypertensive chronic kidney disease with stage 1 through stage 4 chronic kidney disease, or unspecified chronic kidney disease: Secondary | ICD-10-CM | POA: Insufficient documentation

## 2023-09-18 DIAGNOSIS — K219 Gastro-esophageal reflux disease without esophagitis: Secondary | ICD-10-CM | POA: Insufficient documentation

## 2023-09-18 DIAGNOSIS — Z7984 Long term (current) use of oral hypoglycemic drugs: Secondary | ICD-10-CM | POA: Diagnosis not present

## 2023-09-18 DIAGNOSIS — N183 Chronic kidney disease, stage 3 unspecified: Secondary | ICD-10-CM | POA: Diagnosis not present

## 2023-09-18 DIAGNOSIS — Z7982 Long term (current) use of aspirin: Secondary | ICD-10-CM | POA: Diagnosis not present

## 2023-09-18 DIAGNOSIS — M129 Arthropathy, unspecified: Secondary | ICD-10-CM | POA: Insufficient documentation

## 2023-09-18 DIAGNOSIS — Z809 Family history of malignant neoplasm, unspecified: Secondary | ICD-10-CM | POA: Insufficient documentation

## 2023-09-18 DIAGNOSIS — E785 Hyperlipidemia, unspecified: Secondary | ICD-10-CM | POA: Insufficient documentation

## 2023-09-18 DIAGNOSIS — M8580 Other specified disorders of bone density and structure, unspecified site: Secondary | ICD-10-CM | POA: Insufficient documentation

## 2023-09-18 DIAGNOSIS — E1140 Type 2 diabetes mellitus with diabetic neuropathy, unspecified: Secondary | ICD-10-CM | POA: Diagnosis not present

## 2023-09-18 MED ORDER — ANASTROZOLE 1 MG PO TABS: 1.0000 mg | ORAL_TABLET | Freq: Every day | ORAL | 3 refills | Status: DC

## 2023-09-18 NOTE — Patient Instructions (Signed)
 [facility] at [facility] Discharge Instructions   You were seen and examined today by Dr. Ellin Saba.  He reviewed the results of your recent lab work which are mostly normal/stable. Your iron was very low. We will arrange for you to have an iron infusion here in the clinic to correct this.   We will start you on an anti-estrogen pill called anastrozole. It is taken every day. This is treatment for your breast cancer.   We will see you back in   Return as scheduled.    Thank you for choosing [facility] at [facility] to provide your oncology and hematology care.  To afford each patient quality time with our provider, please arrive at least 15 minutes before your scheduled appointment time.   If you have a lab appointment with the Cancer Center please come in thru the Main Entrance and check in at the main information desk.  You need to re-schedule your appointment should you arrive 10 or more minutes late.  We strive to give you quality time with our providers, and arriving late affects you and other patients whose appointments are after yours.  Also, if you no show three or more times for appointments you may be dismissed from the clinic at the providers discretion.     Again, thank you for choosing [facility].  Our hope is that these requests will decrease the amount of time that you wait before being seen by our physicians.       _____________________________________________________________  Should you have questions after your visit to [facility], please contact our office at [phone] and follow the prompts.  Our office hours are 8:00 a.m. and 4:30 p.m. Monday - Friday.  Please note that voicemails left after 4:00 p.m. may not be returned until the following business day.  We are closed weekends and major holidays.  You do have access to a nurse 24-7, just call the main number to the clinic [phone] and do  not press any options, hold on the line and a nurse will answer the phone.    For prescription refill requests, have your pharmacy contact our office and allow 72 hours.    Due to Covid, you will need to wear a mask upon entering the hospital. If you do not have a mask, a mask will be given to you at the Main Entrance upon arrival. For doctor visits, patients may have 1 support person age 30 or older with them. For treatment visits, patients can not have anyone with them due to social distancing guidelines and our immunocompromised population.

## 2023-09-19 ENCOUNTER — Inpatient Hospital Stay

## 2023-09-19 VITALS — BP 131/76 | HR 99 | Temp 98.4°F | Resp 20

## 2023-09-19 DIAGNOSIS — Z7982 Long term (current) use of aspirin: Secondary | ICD-10-CM | POA: Diagnosis not present

## 2023-09-19 DIAGNOSIS — M129 Arthropathy, unspecified: Secondary | ICD-10-CM | POA: Diagnosis not present

## 2023-09-19 DIAGNOSIS — Z79899 Other long term (current) drug therapy: Secondary | ICD-10-CM | POA: Diagnosis not present

## 2023-09-19 DIAGNOSIS — Z7984 Long term (current) use of oral hypoglycemic drugs: Secondary | ICD-10-CM | POA: Diagnosis not present

## 2023-09-19 DIAGNOSIS — M8580 Other specified disorders of bone density and structure, unspecified site: Secondary | ICD-10-CM | POA: Diagnosis not present

## 2023-09-19 DIAGNOSIS — Z809 Family history of malignant neoplasm, unspecified: Secondary | ICD-10-CM | POA: Diagnosis not present

## 2023-09-19 DIAGNOSIS — Z87891 Personal history of nicotine dependence: Secondary | ICD-10-CM | POA: Diagnosis not present

## 2023-09-19 DIAGNOSIS — I129 Hypertensive chronic kidney disease with stage 1 through stage 4 chronic kidney disease, or unspecified chronic kidney disease: Secondary | ICD-10-CM | POA: Diagnosis not present

## 2023-09-19 DIAGNOSIS — C50411 Malignant neoplasm of upper-outer quadrant of right female breast: Secondary | ICD-10-CM | POA: Diagnosis not present

## 2023-09-19 DIAGNOSIS — E1122 Type 2 diabetes mellitus with diabetic chronic kidney disease: Secondary | ICD-10-CM | POA: Diagnosis not present

## 2023-09-19 DIAGNOSIS — D509 Iron deficiency anemia, unspecified: Secondary | ICD-10-CM | POA: Diagnosis not present

## 2023-09-19 DIAGNOSIS — I10 Essential (primary) hypertension: Secondary | ICD-10-CM | POA: Diagnosis not present

## 2023-09-19 DIAGNOSIS — Z79811 Long term (current) use of aromatase inhibitors: Secondary | ICD-10-CM | POA: Diagnosis not present

## 2023-09-19 DIAGNOSIS — D508 Other iron deficiency anemias: Secondary | ICD-10-CM

## 2023-09-19 DIAGNOSIS — Z860101 Personal history of adenomatous and serrated colon polyps: Secondary | ICD-10-CM | POA: Diagnosis not present

## 2023-09-19 DIAGNOSIS — Z7985 Long-term (current) use of injectable non-insulin antidiabetic drugs: Secondary | ICD-10-CM | POA: Diagnosis not present

## 2023-09-19 DIAGNOSIS — E1140 Type 2 diabetes mellitus with diabetic neuropathy, unspecified: Secondary | ICD-10-CM | POA: Diagnosis not present

## 2023-09-19 DIAGNOSIS — E785 Hyperlipidemia, unspecified: Secondary | ICD-10-CM | POA: Diagnosis not present

## 2023-09-19 DIAGNOSIS — Z170 Estrogen receptor positive status [ER+]: Secondary | ICD-10-CM | POA: Diagnosis not present

## 2023-09-19 DIAGNOSIS — K219 Gastro-esophageal reflux disease without esophagitis: Secondary | ICD-10-CM | POA: Diagnosis not present

## 2023-09-19 DIAGNOSIS — C9000 Multiple myeloma not having achieved remission: Secondary | ICD-10-CM | POA: Diagnosis not present

## 2023-09-19 DIAGNOSIS — Z8673 Personal history of transient ischemic attack (TIA), and cerebral infarction without residual deficits: Secondary | ICD-10-CM | POA: Diagnosis not present

## 2023-09-19 DIAGNOSIS — N183 Chronic kidney disease, stage 3 unspecified: Secondary | ICD-10-CM | POA: Diagnosis not present

## 2023-09-19 DIAGNOSIS — M47816 Spondylosis without myelopathy or radiculopathy, lumbar region: Secondary | ICD-10-CM | POA: Diagnosis not present

## 2023-09-19 MED ORDER — FAMOTIDINE IN NACL 20-0.9 MG/50ML-% IV SOLN
20.0000 mg | Freq: Once | INTRAVENOUS | Status: AC
Start: 2023-09-19 — End: 2023-09-19
  Administered 2023-09-19: 20 mg via INTRAVENOUS
  Filled 2023-09-19: qty 50

## 2023-09-19 MED ORDER — ACETAMINOPHEN 325 MG PO TABS
650.0000 mg | ORAL_TABLET | Freq: Once | ORAL | Status: AC
  Administered 2023-09-19: 650 mg via ORAL
  Filled 2023-09-19: qty 2

## 2023-09-19 MED ORDER — SODIUM CHLORIDE 0.9 % IV SOLN
950.0000 mg | Freq: Once | INTRAVENOUS | Status: AC
  Administered 2023-09-19: 950 mg via INTRAVENOUS
  Filled 2023-09-19: qty 19

## 2023-09-19 MED ORDER — SODIUM CHLORIDE 0.9 % IV SOLN
50.0000 mg | Freq: Once | INTRAVENOUS | Status: AC
  Administered 2023-09-19: 50 mg via INTRAVENOUS
  Filled 2023-09-19: qty 1

## 2023-09-19 MED ORDER — METHYLPREDNISOLONE SODIUM SUCC 125 MG IJ SOLR
125.0000 mg | Freq: Once | INTRAMUSCULAR | Status: AC
  Administered 2023-09-19: 125 mg via INTRAVENOUS
  Filled 2023-09-19: qty 2

## 2023-09-19 MED ORDER — SODIUM CHLORIDE 0.9 % IV SOLN
INTRAVENOUS | Status: AC
Start: 2023-09-19 — End: ?

## 2023-09-19 MED ORDER — CETIRIZINE HCL 10 MG/ML IV SOLN
10.0000 mg | Freq: Once | INTRAVENOUS | Status: AC
  Administered 2023-09-19: 10 mg via INTRAVENOUS
  Filled 2023-09-19: qty 1

## 2023-09-26 ENCOUNTER — Other Ambulatory Visit: Payer: Self-pay | Admitting: Cardiology

## 2023-10-01 ENCOUNTER — Inpatient Hospital Stay: Admitting: Hematology

## 2023-10-10 ENCOUNTER — Other Ambulatory Visit: Payer: Self-pay | Admitting: Family Medicine

## 2023-10-17 ENCOUNTER — Encounter: Payer: Self-pay | Admitting: Cardiology

## 2023-10-17 ENCOUNTER — Other Ambulatory Visit: Payer: Self-pay | Admitting: Orthopedic Surgery

## 2023-10-17 ENCOUNTER — Ambulatory Visit: Attending: Cardiology | Admitting: Cardiology

## 2023-10-17 VITALS — BP 120/68 | HR 86 | Ht 66.0 in | Wt 178.0 lb

## 2023-10-17 DIAGNOSIS — E782 Mixed hyperlipidemia: Secondary | ICD-10-CM

## 2023-10-17 DIAGNOSIS — R0789 Other chest pain: Secondary | ICD-10-CM | POA: Diagnosis not present

## 2023-10-17 DIAGNOSIS — I10 Essential (primary) hypertension: Secondary | ICD-10-CM

## 2023-10-17 DIAGNOSIS — M25512 Pain in left shoulder: Secondary | ICD-10-CM

## 2023-10-17 NOTE — Progress Notes (Signed)
 Clinical Summary Ms. Henniger is a 78 y.o.female seen today for follow up of the following medical problems.    1. Chest pain/SOB - echo 02/2016 LVEF 60-65%, grade I diastoilc dysfunction - 12/2013 nuclear stress without ischemia, low risk.  07/2016 PFTs: mild restrictive disease   03/2018 LVEF 55-60%, no WMAs, indeterminate diastolic function - still some SOB at times, overall stable. No recent chest pain      01/2022 nuclear stress: no ischemia Some chest pains at times, often with anxiety     Midchest/epigastric pains worst with bending over.Sometimes after eating. No exertional pains   2. History of TIA      3. History of multiple myeloma      4. HTN -she is compliant with meds   5. OSA Not comfortable wearing cpap       6. HLD 11/2022 TC 125 TG 95 HDL 47 LDL 60 - 07/2023 TC 161 TG 096 HDL 37 LDL 46 -compliant with meds   7.History of recurrent kidney stones  8. Breast cancer - followed by oncology   Past Medical History:  Diagnosis Date   Anxiety disorder    Arthritis    Chronic back pain    Chronic pain of both shoulders    per pt told due to cervical spine pinched nerve   CKD (chronic kidney disease), stage III [facility])    nephrologist--- dr Carrolyn Clan;   due to tubular necrosis   Claustrophobia    Depression    Edema of both lower extremities    GERD (gastroesophageal reflux disease)    Headache    Hemorrhoids    w/ intermittant bleeding   History of adenomatous polyp of colon    History of kidney stones    Hx-TIA (transient ischemic attack) 02/26/2016   per pt no residual   Hydronephrosis, left    followed by urologist--- dr Joie Narrow;   persistant   Hyperlipidemia    Hypertension    Iron  deficiency    followed by [facility] w/ hx iron  infusions   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   oncologist--- [facility];  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, active survillence   Lactose intolerance in adult 02/01/2016   Left  ureteral calculus    Lumbar disc disease with radiculopathy    Nephrolithiasis    per CT 08-11-2021  left > right   Neuropathy, peripheral    Nocturia more than twice per night    Normocytic anemia    OSA (obstructive sleep apnea)    per study 2007;   per pt no Bipap use since 2021   Renal atrophy, bilateral    Renal cyst, right    Sciatica    Sigmoid diverticulosis    Type 2 diabetes mellitus (HCC)      Allergies  Allergen Reactions   Ace Inhibitors Cough    Patient doesn't recall  Other Reaction(s): GI Intolerance   Cephalexin Diarrhea and Nausea And Vomiting    Other Reaction(s): GI Intolerance   Nitrofurantoin  Diarrhea and Nausea And Vomiting   Penicillins Hives and Other (See Comments)     Current Outpatient Medications  Medication Sig Dispense Refill   acetaminophen  (TYLENOL ) 500 MG tablet Take 2 tablets (1,000 mg total) by mouth at bedtime. (Patient taking differently: Take 1,000 mg by mouth every 6 (six) hours as needed for moderate pain (pain score 4-6).)     amLODipine  (NORVASC ) 10 MG tablet TAKE 1 TABLET BY  MOUTH EVERY DAY 90 tablet 1   anastrozole  (ARIMIDEX ) 1 MG tablet Take 1 tablet (1 mg total) by mouth daily. 90 tablet 3   aspirin  EC 81 MG tablet Take 81 mg by mouth daily. Swallow whole.     cholecalciferol (VITAMIN D3) 25 MCG (1000 UNIT) tablet Take 1,000 Units by mouth daily.     diclofenac  Sodium (VOLTAREN ) 1 % GEL APPLY 4 G TOPICALLY 4 (FOUR) TIMES DAILY AS NEEDED (PAIN). 400 g 5   diphenhydramine -acetaminophen  (TYLENOL  PM) 25-500 MG TABS tablet Take 2 tablets by mouth at bedtime.     Dulaglutide  (TRULICITY ) 0.75 MG/0.5ML SOAJ INJECT 0.75 MG INTO THE SKIN WEEKLY 2 mL 1   ezetimibe  (ZETIA ) 10 MG tablet TAKE 1 TABLET BY MOUTH EVERY DAY 90 tablet 1   folic acid  (FOLVITE ) 1 MG tablet Take 1 tablet (1 mg total) by mouth daily.     gabapentin  (NEURONTIN ) 300 MG capsule TAKE 1 CAPSULE BY MOUTH THREE TIMES A DAY AS NEEDED (Patient taking differently: Take 300 mg  by mouth 2 (two) times daily.) 90 capsule 5   glucose blood (ONETOUCH ULTRA) test strip USE AS DIRECTED 100 strip 5   Magnesium  400 MG CAPS Take 1 tablet by mouth daily. 30 capsule 11   mirabegron  ER (MYRBETRIQ ) 25 MG TB24 tablet Take 1 tablet (25 mg total) by mouth daily.     nitroGLYCERIN  (NITROSTAT ) 0.4 MG SL tablet PLACE 1 TABLET (0.4 MG TOTAL) UNDER THE TONGUE EVERY 5 (FIVE) MINUTES AS NEEDED FOR CHEST PAIN. UP TO 3 TABLETS, THEN CALL 911. 25 tablet 3   OneTouch Delica Lancets 30G MISC Once daily testing dx e11.9 100 each 5   pantoprazole  (PROTONIX ) 40 MG tablet TAKE 1 TABLET BY MOUTH TWICE A DAY 180 tablet 1   PARoxetine  (PAXIL ) 40 MG tablet Take 1 tablet (40 mg total) by mouth in the morning. 90 tablet 1   rosuvastatin  (CRESTOR ) 20 MG tablet TAKE 1 TABLET BY MOUTH EVERY DAY 90 tablet 3   sennosides-docusate sodium  (SENOKOT-S) 8.6-50 MG tablet Take 1 tablet by mouth daily. 30 tablet 2   SYMBICORT  80-4.5 MCG/ACT inhaler INHALE 2 PUFFS INTO THE LUNGS TWICE A DAY 30.6 each 4   temazepam  (RESTORIL ) 7.5 MG capsule Take 1 capsule (7.5 mg total) by mouth at bedtime as needed for sleep. 30 capsule 2   TRADJENTA  5 MG TABS tablet TAKE 1 TABLET (5 MG TOTAL) BY MOUTH DAILY. 90 tablet 1   traMADol  (ULTRAM ) 50 MG tablet TAKE 1 TABLET BY MOUTH EVERY 6 HOURS AS NEEDED. 60 tablet 0   UNABLE TO FIND Diabetic shoes x 1 pair, inserts x 3 pair  DX E11.9 1 each 0   vitamin B-12 (VITAMIN B12) 500 MCG tablet Take 1 tablet (500 mcg total) by mouth daily.     No current facility-administered medications for this visit.   Facility-Administered Medications Ordered in Other Visits  Medication Dose Route Frequency Provider Last Rate Last Admin   0.9 %  sodium chloride  infusion   Intravenous Continuous Paulett Boros, MD   Stopped at 09/19/23 1343     Past Surgical History:  Procedure Laterality Date   BIOPSY N/A 03/17/2013   Procedure: GASTRIC BIOPSIES;  Surgeon: Alyce Jubilee, MD;  Location: [facility];   Service: Endoscopy;  Laterality: N/A;   BIOPSY  06/10/2015   Procedure: BIOPSY;  Surgeon: Alyce Jubilee, MD;  [facility];  Service: Endoscopy;;  gastric biopsy   BREAST BIOPSY Right 08/20/2023  path pending   BREAST BIOPSY Right 08/20/2023   US  RT BREAST BX W LOC DEV 1ST LESION IMG BX SPEC US  GUIDE 08/20/2023 Alger Infield, MD [facility]   CARDIAC CATHETERIZATION  05/11/2003   @ [facility] by dr t. Loetta Ringer;  Abnormal cardiolite /   Normal coronary arteries and normal LVF,  ef  63%   CARDIOVASCULAR STRESS TEST  01/01/2014   normal lexiscan  cardiolite /  no ischemia/ infarct/  normal LV function and wall motion ,  ef 81%   COLONOSCOPY WITH PROPOFOL  N/A 02/26/2017   Procedure: COLONOSCOPY WITH PROPOFOL ;  Surgeon: Alyce Jubilee, MD;  [facility];  Service: Endoscopy;  Laterality: N/A;  11:00am   COLONOSCOPY WITH PROPOFOL  N/A 09/22/2019   Procedure: COLONOSCOPY WITH PROPOFOL ;  Surgeon: Alyce Jubilee, MD;  [facility];  Service: Endoscopy;  Laterality: N/A;  1:15   CYSTO/   RIGHT URETEROSOCOPY LASER LITHOTRIPSY STONE EXTRACTION  10/13/2004   CYSTO/  RIGHT RETROGRADE PYELOGRAM/  PLACMENT RIGHT URETERAL STENT  01/10/2010   CYSTOSCOPY W/ URETERAL STENT PLACEMENT Right 08/19/2015   Procedure: CYSTOSCOPY WITH RETROGRADE PYELOGRAM/URETERAL STENT PLACEMENT;  Surgeon: Trent Frizzle, MD;  Location: [facility];  Service: Urology;  Laterality: Right;   CYSTOSCOPY W/ URETERAL STENT PLACEMENT Left 02/23/2020   Procedure: CYSTOSCOPY LEFT  RETROGRADE PYELOGRAM LEFT URETEROSCOPY URETERAL STENT PLACEMENT;  Surgeon: Trent Frizzle, MD;  Location: [facility];  Service: Urology;  Laterality: Left;   CYSTOSCOPY W/ URETERAL STENT PLACEMENT Right 09/24/2022   Procedure: CYSTOSCOPY WITH RETROGRADE PYELOGRAM/URETERAL STENT PLACEMENT;  Surgeon: Marco Severs, MD;  Location: [facility];  Service: Urology;  Laterality: Right;   CYSTOSCOPY W/ URETERAL STENT  PLACEMENT Right 10/25/2022   Procedure: RIGHT URETERAL STENT EXCHANGE;  Surgeon: Marco Severs, MD;  Location: [facility];  Service: Urology;  Laterality: Right;   CYSTOSCOPY WITH RETROGRADE PYELOGRAM, URETEROSCOPY AND STENT PLACEMENT Left 10/21/2014   Procedure: CYSTOSCOPY WITH RETROGRADE PYELOGRAM, URETEROSCOPY AND STENT PLACEMENT;  Surgeon: Trent Frizzle, MD;  Location: [facility];  Service: Urology;  Laterality: Left;   CYSTOSCOPY WITH RETROGRADE PYELOGRAM, URETEROSCOPY AND STENT PLACEMENT Right 11/22/2015   Procedure: CYSTOSCOPY, RIGHT URETERAL STENT REMOVAL; RIGHT RETROGRADE PYELOGRAM, RIGHT URETEROSCOPY WITH BALLOON DILATION; RIGHT URETERAL STENT PLACEMENT;  Surgeon: Trent Frizzle, MD;  Location: [facility];  Service: Urology;  Laterality: Right;   CYSTOSCOPY WITH RETROGRADE PYELOGRAM, URETEROSCOPY AND STENT PLACEMENT Left 10/18/2020   Procedure: CYSTOSCOPY WITH LEFT RETROGRADE PYELOGRAM, LEFT URETEROSCOPY  WITH LASER AND LEFT URETERAL STENT PLACEMENT;  Surgeon: Trent Frizzle, MD;  [facility];  Service: Urology;  Laterality: Left;   CYSTOSCOPY WITH RETROGRADE PYELOGRAM, URETEROSCOPY AND STENT PLACEMENT Left 10/26/2021   Procedure: CYSTOSCOPY WITH RETROGRADE PYELOGRAM, URETEROSCOPY, STONE EXTRACTION  AND STENT PLACEMENT;  Surgeon: Trent Frizzle, MD;  Location[facility];  Service: Urology;  Laterality: Left;   CYSTOSCOPY WITH RETROGRADE PYELOGRAM, URETEROSCOPY AND STENT PLACEMENT Bilateral 10/25/2022   Procedure: CYSTOSCOPY WITH RETROGRADE PYELOGRAM, URETEROSCOPY;  Surgeon: Marco Severs, MD;  [facility]: [facility];  Service: Urology;  Laterality: Bilateral;   CYSTOSCOPY/RETROGRADE/URETEROSCOPY/STONE EXTRACTION WITH BASKET Bilateral 08/02/2015   Procedure: CYSTOSCOPY; BILATERAL RETROGRADE PYELOGRAMS; BILATERAL URETEROSCOPY, STONE EXTRACTION WITH BASKET;  Surgeon: Trent Frizzle, MD;  Location: [facility];  Service: Urology;  Laterality:  Bilateral;   CYSTOSCOPY/RETROGRADE/URETEROSCOPY/STONE EXTRACTION WITH BASKET Right 06/28/2015   Procedure: CYSTOSCOPY/RETROGRADE/URETEROSCOPY/STONE EXTRACTION WITH BASKET, RIGHT URETERAL DOUBLE J STENT PLACEMENT;  Surgeon: Trent Frizzle, MD;  Location[facility];  Service: Urology;  Laterality: Right;  ESOPHAGOGASTRODUODENOSCOPY (EGD) WITH PROPOFOL  N/A 03/17/2013   Procedure: ESOPHAGOGASTRODUODENOSCOPY (EGD) WITH PROPOFOL ;  Surgeon: Alyce Jubilee, MD;  Location: [facility];  Service: Endoscopy;  Laterality: N/A;   ESOPHAGOGASTRODUODENOSCOPY (EGD) WITH PROPOFOL  N/A 06/10/2015   Distal gastritis, distal esophageal stricture s/p dilation   ESOPHAGOGASTRODUODENOSCOPY (EGD) WITH PROPOFOL  N/A 02/26/2017   Procedure: ESOPHAGOGASTRODUODENOSCOPY (EGD) WITH PROPOFOL ;  Surgeon: Alyce Jubilee, MD;  [facility];  Service: Endoscopy;  Laterality: N/A;   FLEXIBLE SIGMOIDOSCOPY  09/11/2011   [id number] Internal hemorrhoids   HOLMIUM LASER APPLICATION Left 10/21/2014   Procedure: HOLMIUM LASER APPLICATION;  Surgeon: Trent Frizzle, MD;  Location: [facility];  Service: Urology;  Laterality: Left;   HOLMIUM LASER APPLICATION Right 06/28/2015   Procedure: HOLMIUM LASER APPLICATION;  Surgeon: Trent Frizzle, MD;  Location: [facility];  Service: Urology;  Laterality: Right;   HOLMIUM LASER APPLICATION  02/23/2020   Procedure: HOLMIUM LASER APPLICATION;  Surgeon: Trent Frizzle, MD;  Location: [facility];  Service: Urology;;   HOLMIUM LASER APPLICATION Left 10/18/2020   Procedure: HOLMIUM LASER APPLICATION;  Surgeon: Trent Frizzle, MD;  Location: [facility];  Service: Urology;  Laterality: Left;   HOLMIUM LASER APPLICATION Right 10/25/2022   Procedure: HOLMIUM LASER APPLICATION;  Surgeon: Marco Severs, MD;  Location: [facility];  Service: Urology;  Laterality: Right;   MASS EXCISION Left 03/04/2015   Procedure: EXCISION OF SOFT TISSUE NEOPLASM LEFT ARM;  Surgeon: Alanda Allegra, MD;   Location: [facility];  Service: General;  Laterality: Left;   PERCUTANEOUS NEPHROSTOLITHOTOMY Bilateral 05/2010   @[facility]    POLYPECTOMY N/A 03/17/2013   Procedure: GASTRIC POLYPECTOMY;  Surgeon: Alyce Jubilee, MD;  Location[facility];  Service: Endoscopy;  Laterality: N/A;   POLYPECTOMY  02/26/2017   Procedure: POLYPECTOMY;  Surgeon: Alyce Jubilee, MD;  [facility];  Service: Endoscopy;;  polyp at cecum, ascending colon polyps x3, hepatic flexure polyps x8, transverse colon polyps x8    POLYPECTOMY  09/22/2019   Procedure: POLYPECTOMY;  Surgeon: Alyce Jubilee, MD;  [facility];  Service: Endoscopy;;   REMOVAL RIGHT THIGH CYST  2006   SAVORY DILATION N/A 03/17/2013   Procedure: SAVORY DILATION;  Surgeon: Alyce Jubilee, MD;  Location[facility];  Service: Endoscopy;  Laterality: N/A;  #12.8, 14, 15, 16 dilators used   SAVORY DILATION N/A 06/10/2015   Procedure: SAVORY DILATION;  Surgeon: Alyce Jubilee, MD;  [facility];  Service: Endoscopy;  Laterality: N/A;   VAGINAL HYSTERECTOMY  1970's     Allergies  Allergen Reactions   Ace Inhibitors Cough    Patient doesn't recall  Other Reaction(s): GI Intolerance   Cephalexin Diarrhea and Nausea And Vomiting    Other Reaction(s): GI Intolerance   Nitrofurantoin  Diarrhea and Nausea And Vomiting   Penicillins Hives and Other (See Comments)      Family History  Problem Relation Age of Onset   Hypertension Mother    Diabetes Mother    Heart failure Mother    Dementia Mother    Emphysema Father    Hypertension Father    Diabetes Brother    GER disease Brother    Hypertension Sister    Hypertension Sister    Cancer Other        family history    Diabetes Other        family history    Heart defect Other        famiily history  Arthritis Other        family history    Anesthesia problems Neg Hx    Hypotension Neg Hx    Malignant hyperthermia Neg Hx    Pseudochol deficiency Neg Hx    Colon  cancer Neg Hx      Social History Ms. Tappan reports that she quit smoking about 35 years ago. Her smoking use included cigarettes. She started smoking about 49 years ago. She has a 20 pack-year smoking history. She has been exposed to tobacco smoke. She has never used smokeless tobacco. Ms. Fierstein reports no history of alcohol use.    Physical Examination Today's Vitals   10/17/23 1131  BP: 120/68  Pulse: 86  SpO2: 96%  Weight: 178 lb (80.7 kg)  Height: 5\' 6"  (1.676 m)   Body mass index is 28.73 kg/m.  Gen: resting comfortably, no acute distress HEENT: no scleral icterus, pupils equal round and reactive, no palptable cervical adenopathy,  CV: RRR, no m/rg, no jvd Resp: Clear to auscultation bilaterally GI: abdomen is soft, non-tender, non-distended, normal bowel sounds, no hepatosplenomegaly MSK: extremities are warm, no edema.  Skin: warm, no rash Neuro:  no focal deficits Psych: appropriate affect   Diagnostic Studies   03/2018 echo Study Conclusions   - Left ventricle: The cavity size was normal. Wall thickness was   increased in a pattern of mild LVH. Systolic function was normal.   The estimated ejection fraction was in the range of 55% to 60%.   Wall motion was normal; there were no regional wall motion   abnormalities. Indeterminate diastolic function. - Right ventricle: The cavity size was mildly dilated. - Right atrium: Central venous pressure (est): 3 mm Hg. - Atrial septum: No defect or patent foramen ovale was identified. - Tricuspid valve: There was trivial regurgitation. - Pulmonary arteries: PA peak pressure: 21 mm Hg (S). - Pericardium, extracardiac: There was no pericardial effusion.   01/2022 nuclear stress The study is normal. There are no perfusion defects consistent with prior infarct or current ischemia.  The study is low risk.   No ST deviation was noted.   Left ventricular function is normal. Nuclear stress EF: 64 %. The left ventricular  ejection fraction is normal (55-65%). End diastolic cavity size is normal.    Assessment and Plan   1. Chest pain -long history of chest pains at least the last 10 years.  - multiple stress tests have been benign  - no recent cadiac like chest pains, continue to monitor at this time.      2. HTN - well controlled, continue current meds   3. Hyperlipidemia - lipids are at goal, continue current meds   F/u 1 year.      Laurann Pollock, M.D.

## 2023-10-17 NOTE — Patient Instructions (Addendum)
 Medication Instructions:  Continue all current medications.   Labwork: none  Testing/Procedures: none  Follow-Up: Your physician wants you to follow up in:  1 year.  You should receive a recall letter in the mail about 2 months prior to the time you are due.  If you don't receive this, please call our office to schedule your follow up appointment.      Any Other Special Instructions Will Be Listed Below (If Applicable).   If you need a refill on your cardiac medications before your next appointment, please call your pharmacy.

## 2023-10-18 DIAGNOSIS — R0902 Hypoxemia: Secondary | ICD-10-CM | POA: Diagnosis not present

## 2023-11-08 IMAGING — CT CT ABD-PELV W/O CM
2 of 3 series · 16 of 46 positions shown, 18 images · non-contrast
Comparison: December 17, 2019.

CLINICAL DATA: Abdominal distension.

EXAM:
CT ABDOMEN AND PELVIS WITHOUT CONTRAST
TECHNIQUE: Multidetector CT imaging of the abdomen and pelvis was performed
following the standard protocol without IV contrast.

[Series 2: axial st · axial · 0.86mm/px · z∈[-188,+257]mm · 13 of 103 slices shown, 15 images]
[im 7/103  soft-tissue]
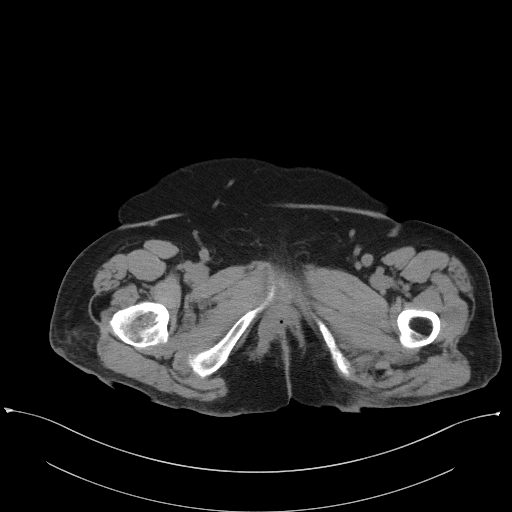
[im 7/103  bone]
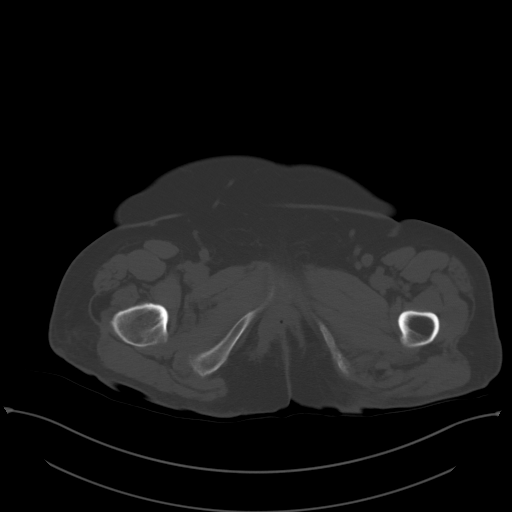
[im 14/103  soft-tissue]
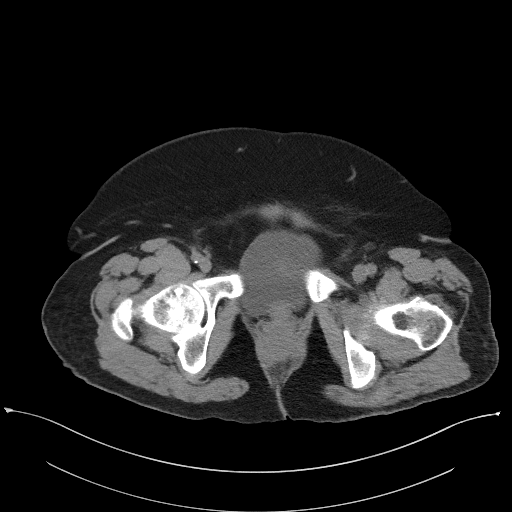
[im 20/103  soft-tissue]
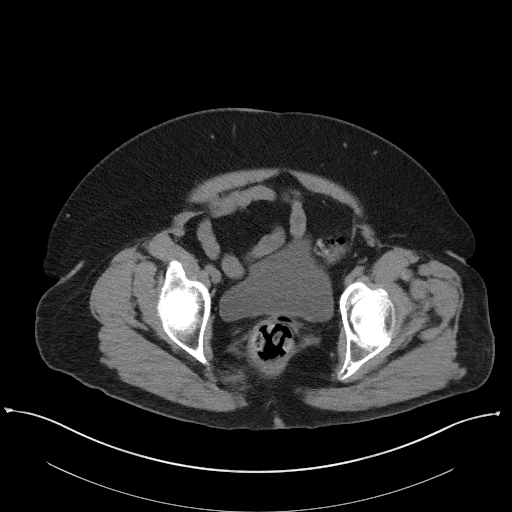
[im 30/103  soft-tissue]
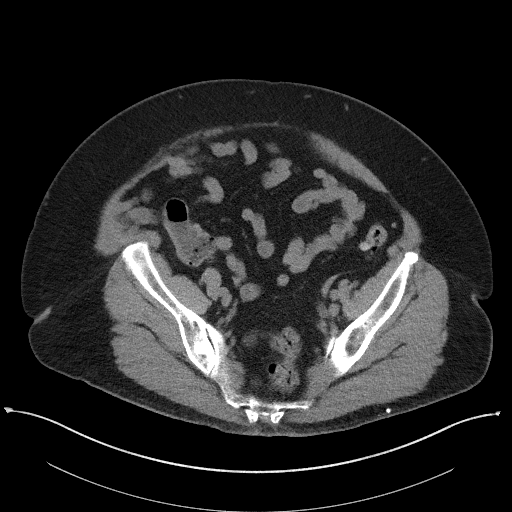
[im 37/103  soft-tissue]
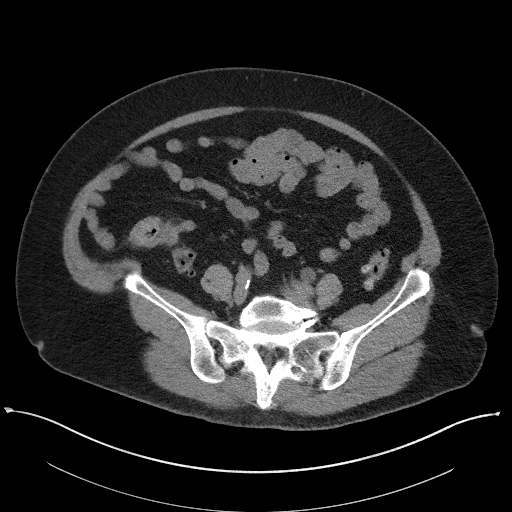
[im 43/103  soft-tissue]
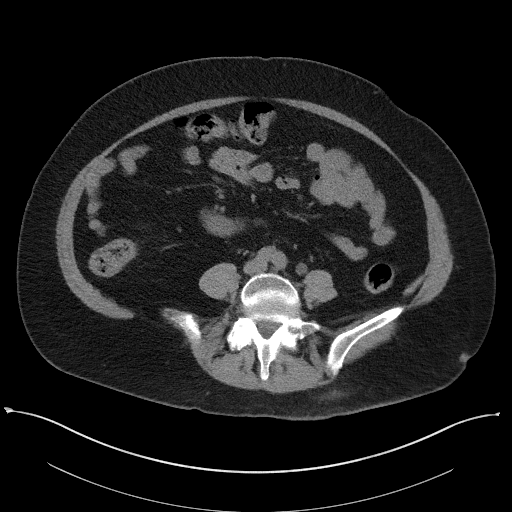
[im 53/103  soft-tissue]
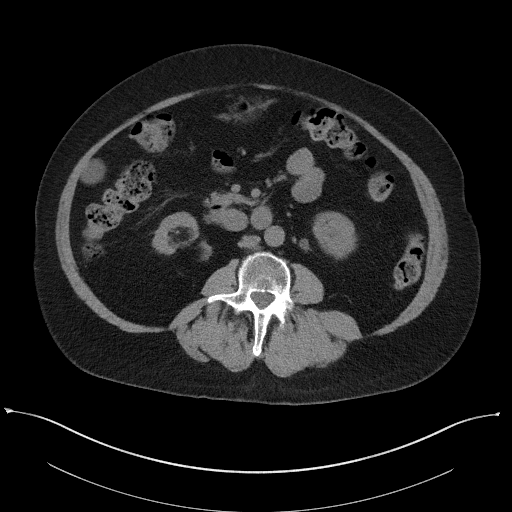
[im 60/103  soft-tissue]
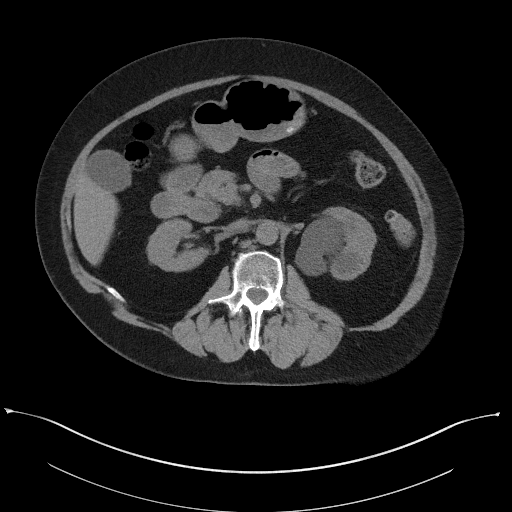
[im 66/103  soft-tissue]
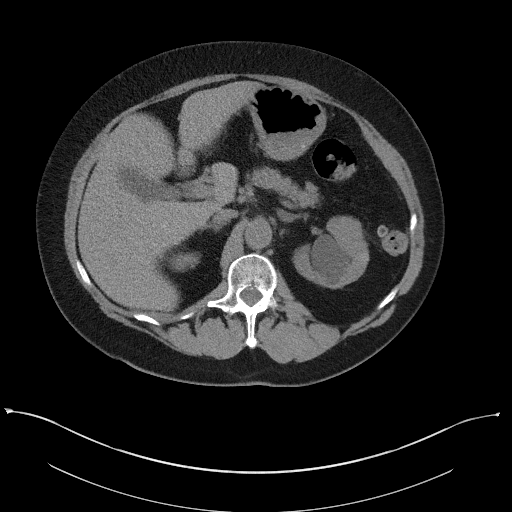
[im 66/103  bone]
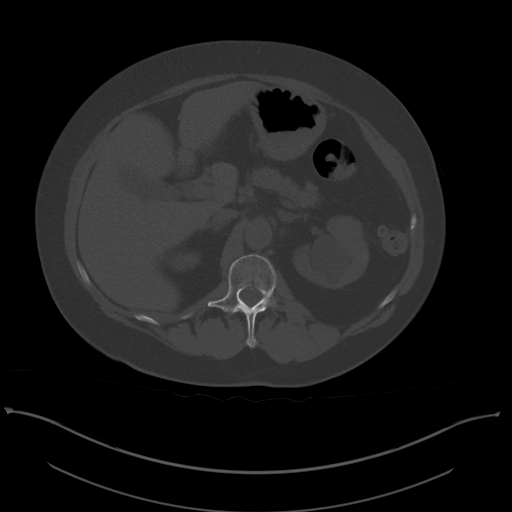
[im 73/103  soft-tissue]
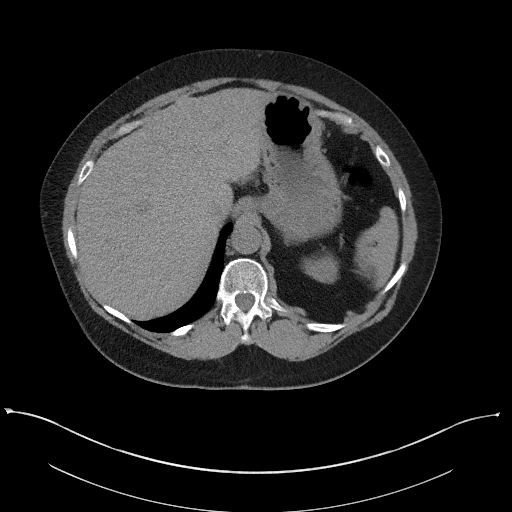
[im 83/103  soft-tissue]
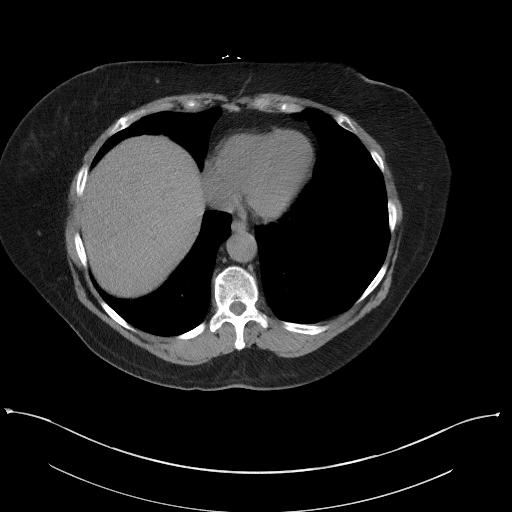
[im 89/103  soft-tissue]
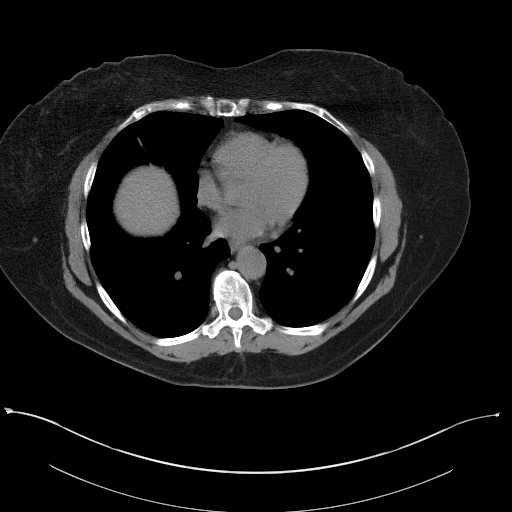
[im 96/103  soft-tissue]
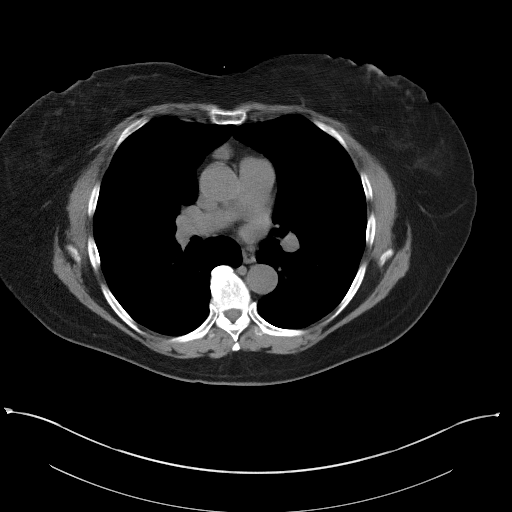

[Series 5: coronal st · coronal · 0.77mm/px · 3 of 142 slices shown]
[im 48/142  soft-tissue]
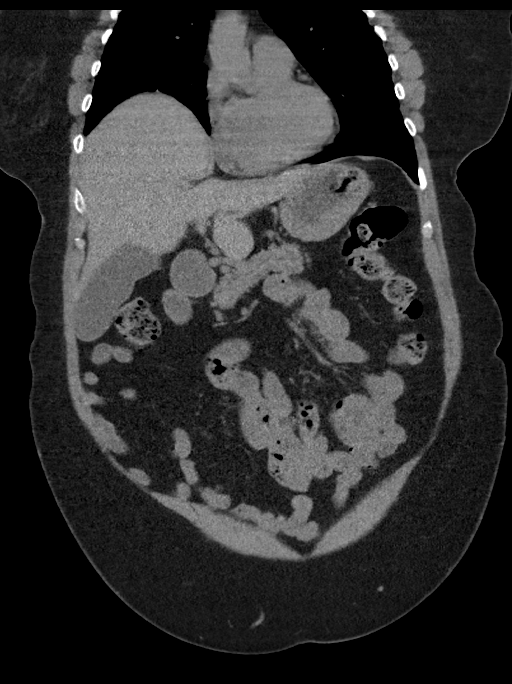
[im 63/142  soft-tissue]
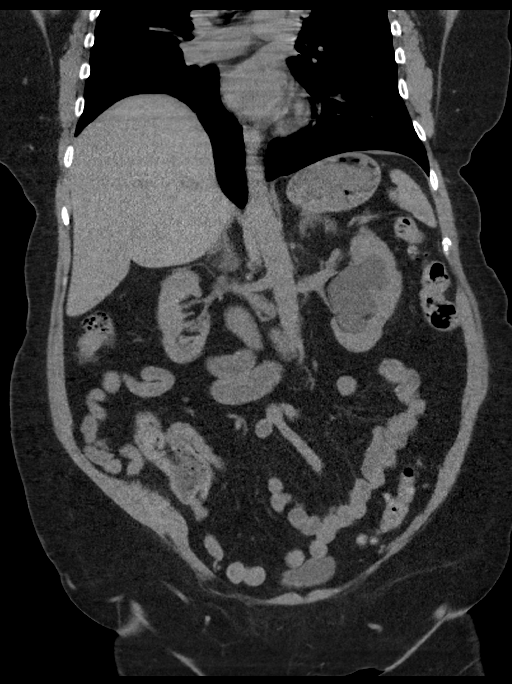
[im 79/142  soft-tissue]
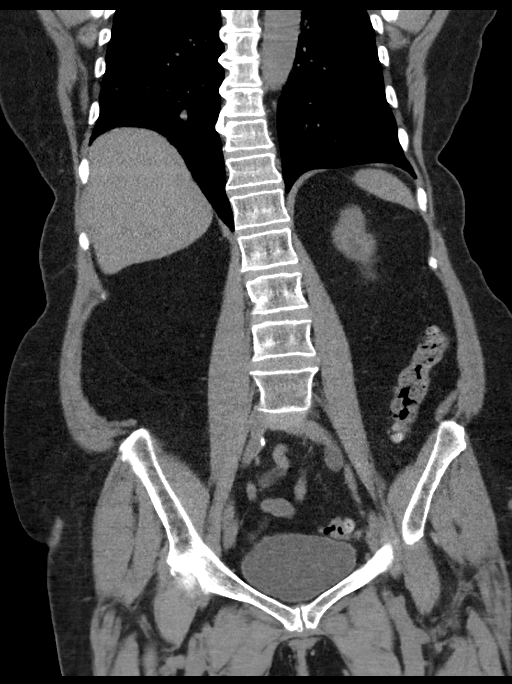

[16 of 46 positions shown; findings below may reference images not displayed]

FINDINGS: Lower chest: No acute abnormality.

Hepatobiliary: No focal liver abnormality is seen. No gallstones,
gallbladder wall thickening, or biliary dilatation.

Pancreas: Unremarkable. No pancreatic ductal dilatation or
surrounding inflammatory changes.

Spleen: Normal in size without focal abnormality.

Adrenals/Urinary Tract: Adrenal glands appear normal. Bilateral
nonobstructive nephrolithiasis is noted. Moderate left
hydroureteronephrosis is noted secondary to 5 mm calculus in distal
left ureter. Urinary bladder is unremarkable.

Stomach/Bowel: Stomach is within normal limits. Appendix appears
normal. No evidence of bowel wall thickening, distention, or
inflammatory changes. Sigmoid diverticulosis is noted without
inflammation.

Vascular/Lymphatic: Aortic atherosclerosis. No enlarged abdominal or
pelvic lymph nodes.

Reproductive: Status post hysterectomy. No adnexal masses.

Other: No abdominal wall hernia or abnormality. No abdominopelvic
ascites.

Musculoskeletal: No acute or significant osseous findings.
IMPRESSION: Bilateral nephrolithiasis. Moderate left hydroureteronephrosis is
noted secondary to 5 mm calculus in distal left ureter.

Sigmoid diverticulosis without inflammation.

Aortic Atherosclerosis (MK7XC-T37.7).

## 2023-11-17 ENCOUNTER — Other Ambulatory Visit: Payer: Self-pay | Admitting: Cardiology

## 2023-11-17 ENCOUNTER — Other Ambulatory Visit: Payer: Self-pay | Admitting: Orthopedic Surgery

## 2023-11-17 ENCOUNTER — Other Ambulatory Visit: Payer: Self-pay | Admitting: Family Medicine

## 2023-11-17 DIAGNOSIS — G8929 Other chronic pain: Secondary | ICD-10-CM

## 2023-11-17 IMAGING — DX DG CHEST 1V PORT
1 series · 1 of 1 positions shown · non-contrast
Comparison: 07/16/2018

CLINICAL DATA: Chest pain radiating down left arm.

EXAM:
PORTABLE CHEST 1 VIEW

[chest ap]
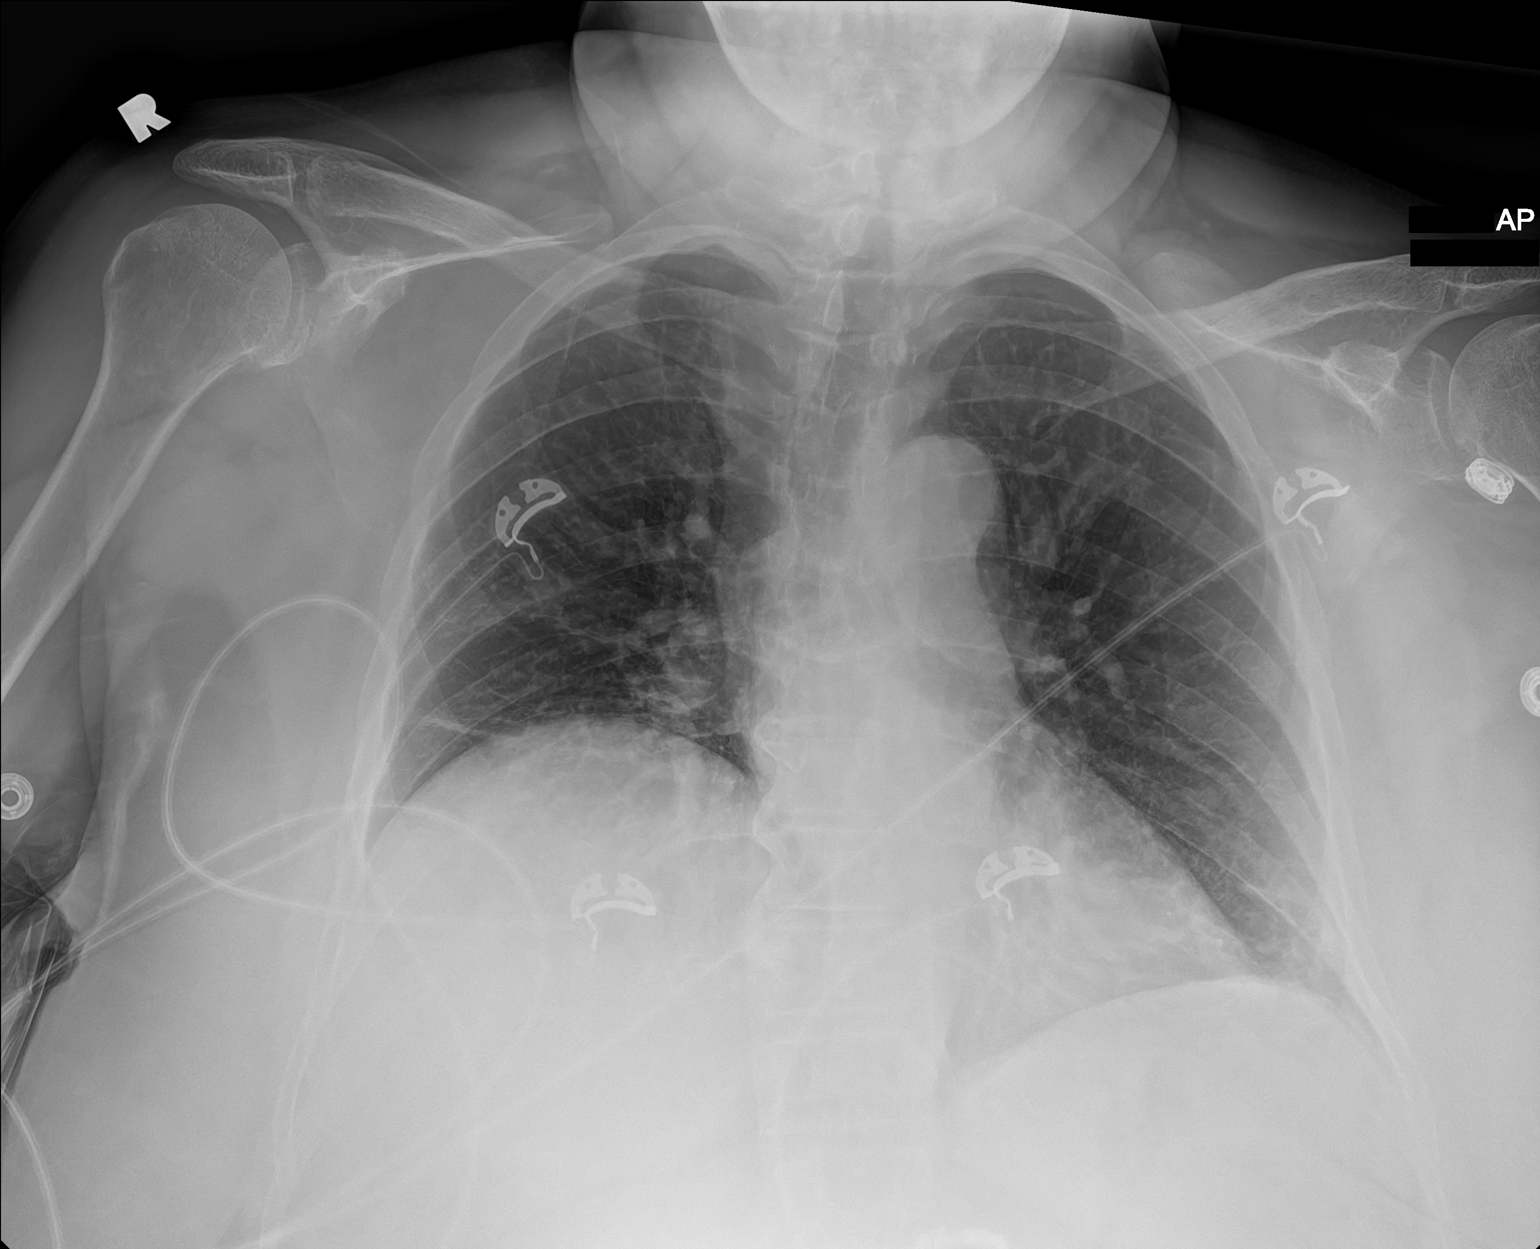

[1 of 1 positions shown; findings below may reference images not displayed]

FINDINGS: Normal heart size. Chronic asymmetric elevation of right
hemidiaphragm with overlying scar like opacity in the right lower
lobe. No signs of pleural effusion or edema. No airspace densities
identified. Spondylosis identified within the thoracic spine.
IMPRESSION: 1. No acute cardiopulmonary abnormalities.
2. Chronic asymmetric elevation of right hemidiaphragm.

## 2023-11-18 DIAGNOSIS — R0902 Hypoxemia: Secondary | ICD-10-CM | POA: Diagnosis not present

## 2023-11-19 IMAGING — MG MM DIGITAL SCREENING BILAT W/ TOMO AND CAD
6 of 10 series · 6 of 30 positions shown · non-contrast
Comparison: Previous exam(s).

CLINICAL DATA: Screening.

EXAM:
DIGITAL SCREENING BILATERAL MAMMOGRAM WITH TOMOSYNTHESIS AND CAD
TECHNIQUE: Bilateral screening digital craniocaudal and mediolateral oblique
mammograms were obtained. Bilateral screening digital breast
tomosynthesis was performed. The images were evaluated with
computer-aided detection.

[L CC synth-2D]
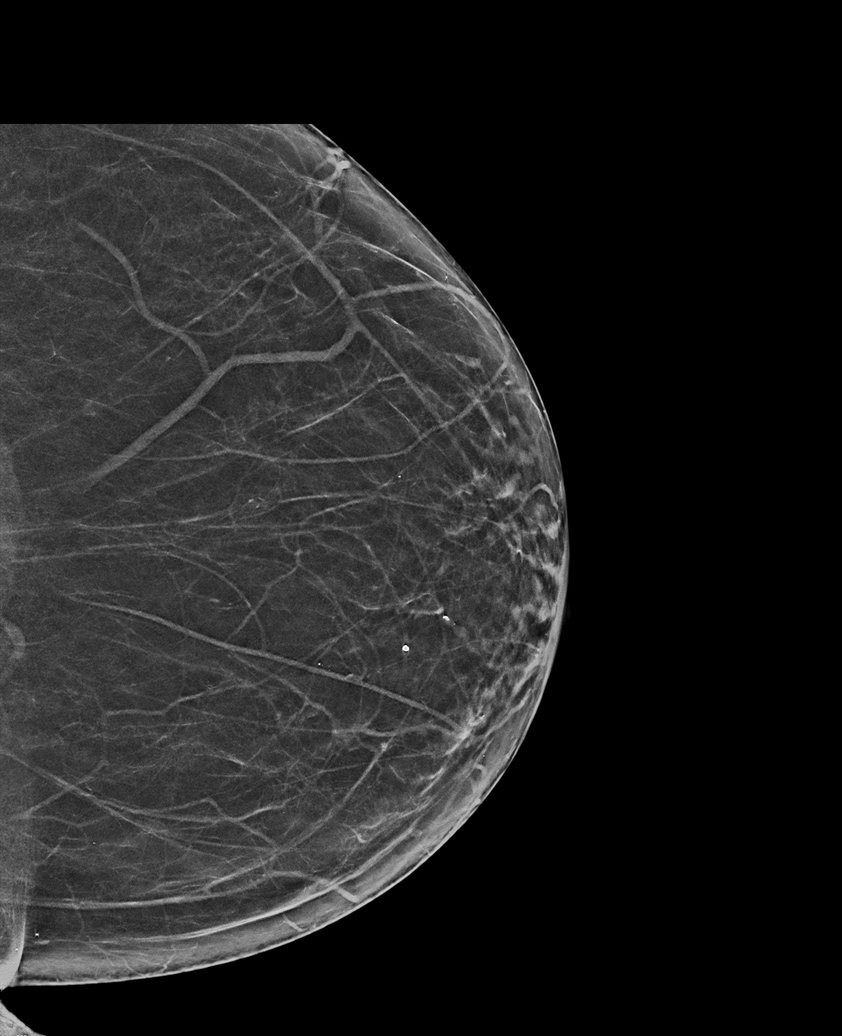

[R CC synth-2D]
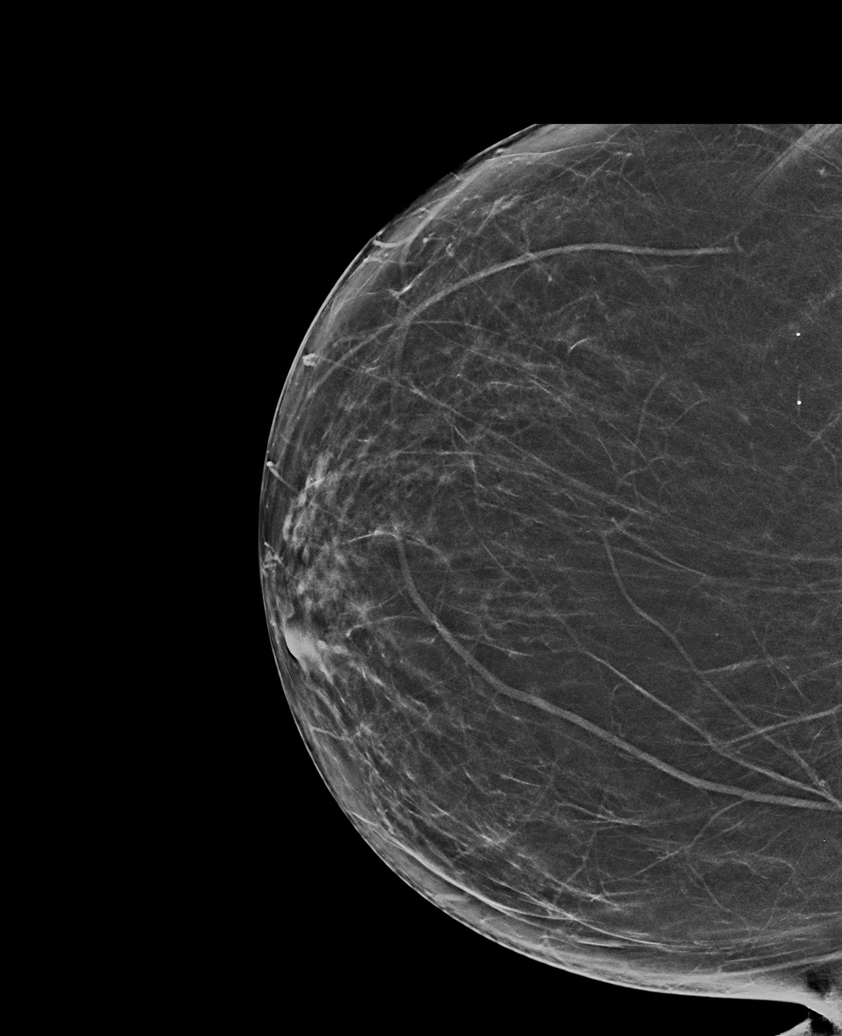

[R MLO synth-2D]
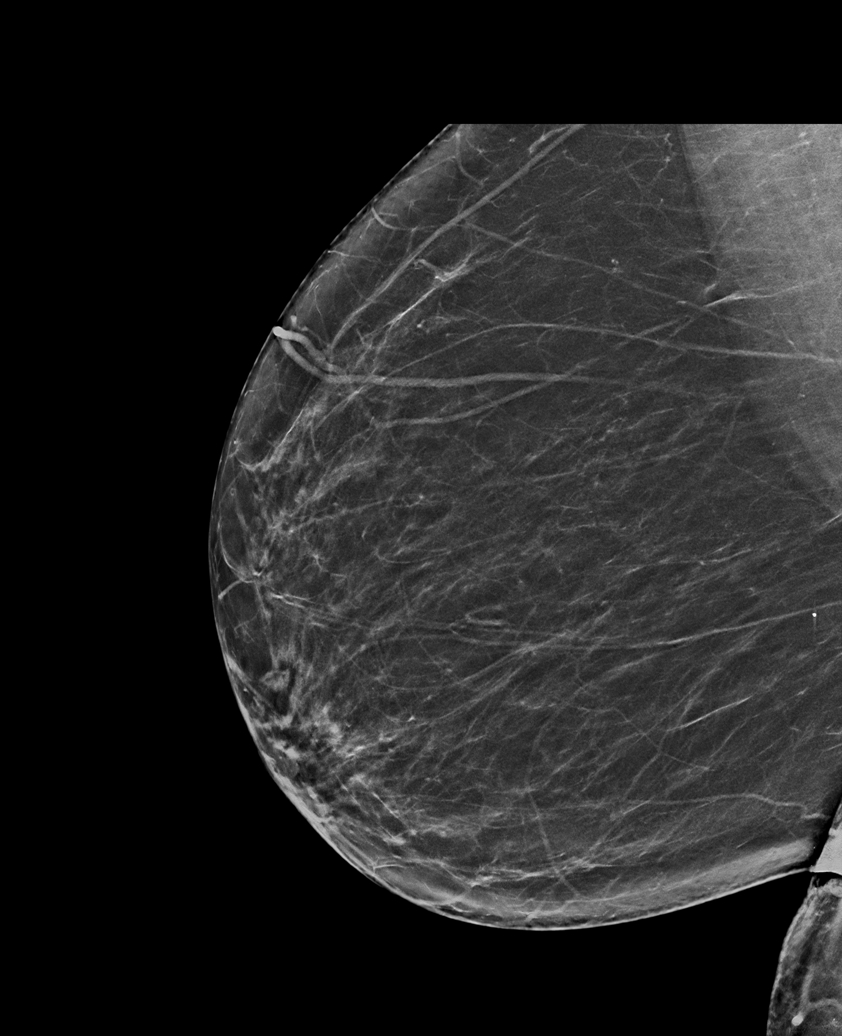

[L MLO synth-2D]
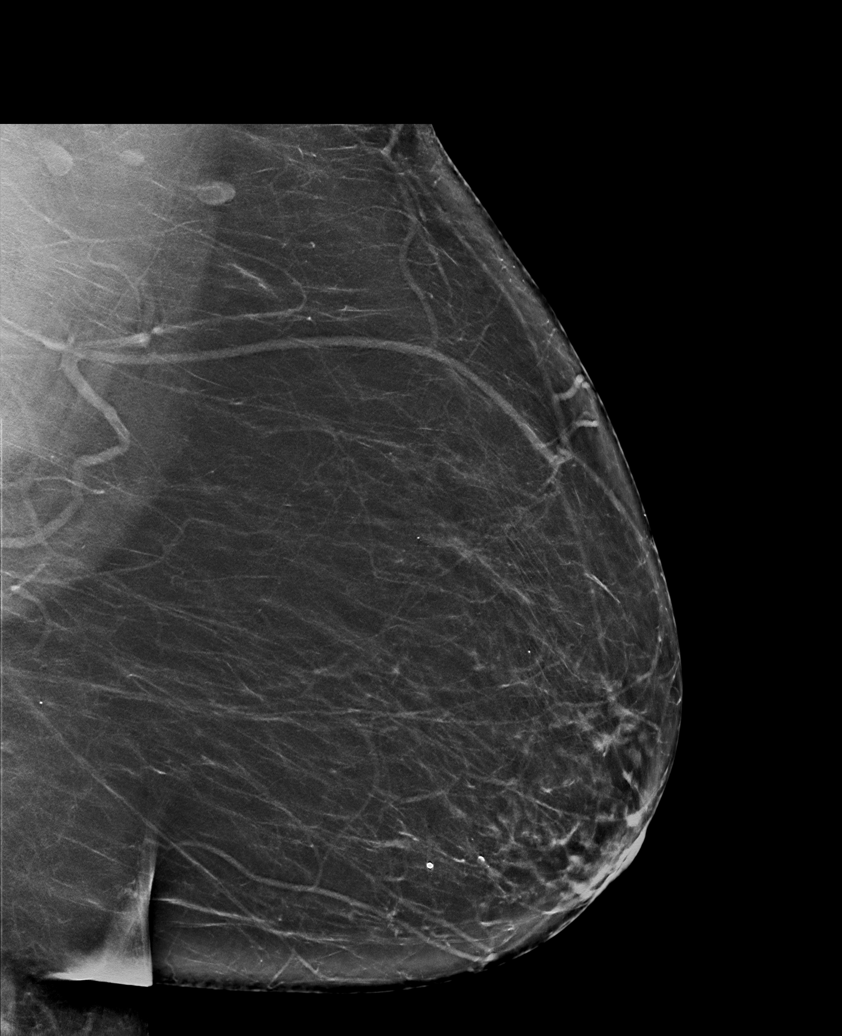

[L XCCL synth-2D]
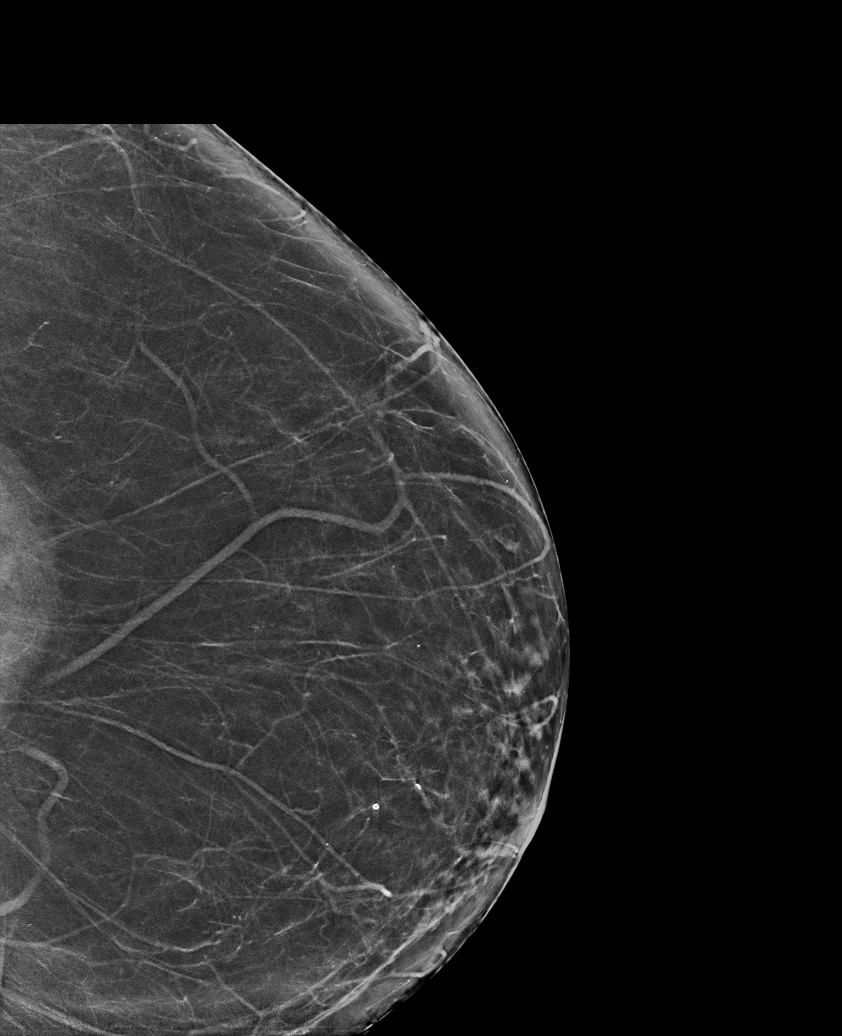

[R MLO tomo · tomo slice 35/70.0]
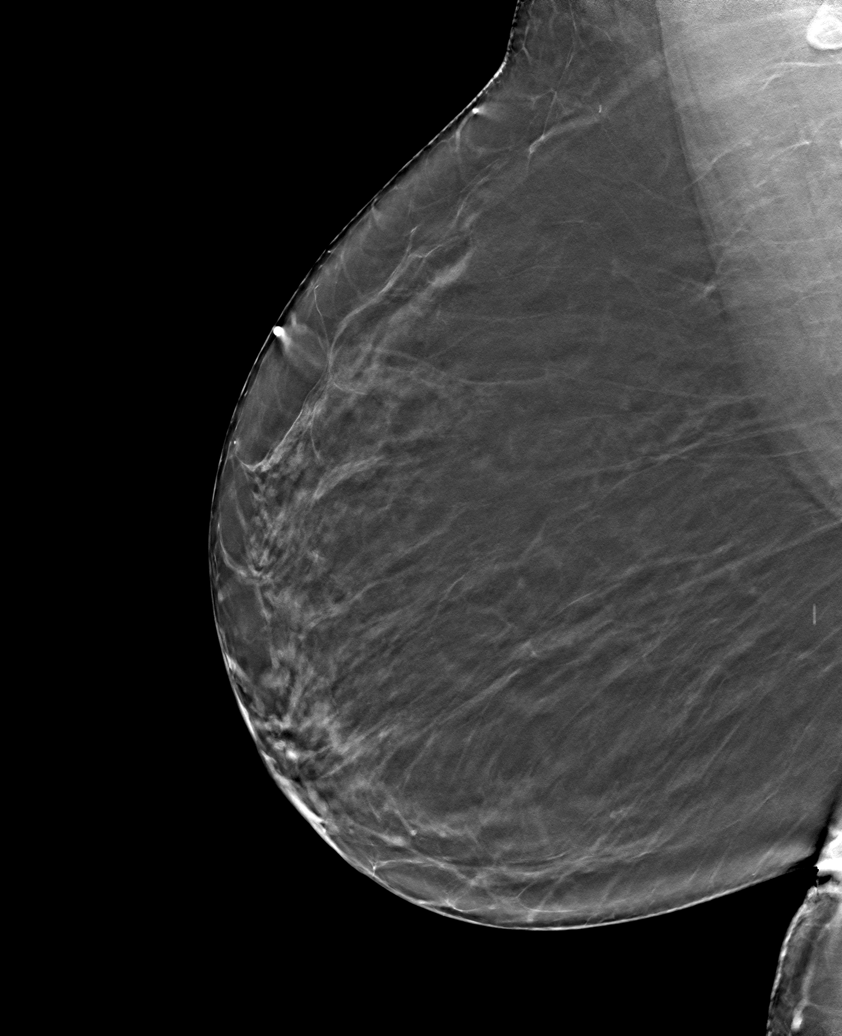

[6 of 30 positions shown; findings below may reference images not displayed]

ACR Breast Density Category b: There are scattered areas of
fibroglandular density.
FINDINGS: There are no findings suspicious for malignancy.
IMPRESSION: No mammographic evidence of malignancy. A result letter of this
screening mammogram will be mailed directly to the patient.

RECOMMENDATION:
Screening mammogram in one year. (Code:51-O-LD2)

BI-RADS CATEGORY  1: Negative.

## 2023-11-22 ENCOUNTER — Other Ambulatory Visit: Payer: Self-pay | Admitting: Orthopedic Surgery

## 2023-11-22 DIAGNOSIS — M25512 Pain in left shoulder: Secondary | ICD-10-CM

## 2023-11-26 ENCOUNTER — Ambulatory Visit: Payer: 59 | Admitting: Cardiology

## 2023-11-30 ENCOUNTER — Other Ambulatory Visit: Payer: Self-pay | Admitting: Orthopedic Surgery

## 2023-11-30 ENCOUNTER — Other Ambulatory Visit: Payer: Self-pay | Admitting: Family Medicine

## 2023-12-06 ENCOUNTER — Encounter: Payer: Self-pay | Admitting: Family Medicine

## 2023-12-06 ENCOUNTER — Telehealth: Payer: Self-pay | Admitting: Family Medicine

## 2023-12-06 ENCOUNTER — Ambulatory Visit (INDEPENDENT_AMBULATORY_CARE_PROVIDER_SITE_OTHER): Payer: 59 | Admitting: Family Medicine

## 2023-12-06 VITALS — BP 110/67 | HR 105 | Resp 18 | Ht 66.5 in | Wt 179.1 lb

## 2023-12-06 DIAGNOSIS — E1121 Type 2 diabetes mellitus with diabetic nephropathy: Secondary | ICD-10-CM | POA: Diagnosis not present

## 2023-12-06 DIAGNOSIS — E559 Vitamin D deficiency, unspecified: Secondary | ICD-10-CM | POA: Diagnosis not present

## 2023-12-06 DIAGNOSIS — E785 Hyperlipidemia, unspecified: Secondary | ICD-10-CM | POA: Diagnosis not present

## 2023-12-06 DIAGNOSIS — D369 Benign neoplasm, unspecified site: Secondary | ICD-10-CM | POA: Diagnosis not present

## 2023-12-06 DIAGNOSIS — D472 Monoclonal gammopathy: Secondary | ICD-10-CM

## 2023-12-06 DIAGNOSIS — I10 Essential (primary) hypertension: Secondary | ICD-10-CM

## 2023-12-06 DIAGNOSIS — T733XXD Exhaustion due to excessive exertion, subsequent encounter: Secondary | ICD-10-CM

## 2023-12-06 DIAGNOSIS — N1832 Chronic kidney disease, stage 3b: Secondary | ICD-10-CM | POA: Diagnosis not present

## 2023-12-06 DIAGNOSIS — F419 Anxiety disorder, unspecified: Secondary | ICD-10-CM

## 2023-12-06 DIAGNOSIS — F32A Depression, unspecified: Secondary | ICD-10-CM

## 2023-12-06 DIAGNOSIS — C9000 Multiple myeloma not having achieved remission: Secondary | ICD-10-CM

## 2023-12-06 DIAGNOSIS — R32 Unspecified urinary incontinence: Secondary | ICD-10-CM

## 2023-12-06 NOTE — Patient Instructions (Addendum)
 F/U in 41/2  months, call if you need me sooner  Labs today, lipid, cmp and eGFr, HBA1C, TSH ad free T4 and vit D   You are referred to Kidney doc re medical kidney disease  It is important that you exercise regularly at least 30 minutes 5 times a week. If you develop chest pain, have severe difficulty breathing, or feel very tired, stop exercising immediately and seek medical attention   Thanks for choosing [facility], we consider it a privelige to serve you.

## 2023-12-06 NOTE — Telephone Encounter (Signed)
 Copied from CRM [id number]. Topic: Clinical - Order For Equipment >> Dec 06, 2023  4:07 PM Antwanette L wrote: Reason for CRM: Patient is requesting a blood glucose meter. Patient can be contacted by [phone].   Preferred Pharmacy [facility] [address] [place] [address] Phone: [phone] Fax: [phone]

## 2023-12-07 ENCOUNTER — Ambulatory Visit: Payer: Self-pay | Admitting: Family Medicine

## 2023-12-07 ENCOUNTER — Encounter: Payer: Self-pay | Admitting: Family Medicine

## 2023-12-07 DIAGNOSIS — R32 Unspecified urinary incontinence: Secondary | ICD-10-CM | POA: Insufficient documentation

## 2023-12-07 LAB — LIPID PANEL
Chol/HDL Ratio: 5.3 ratio — ABNORMAL HIGH (ref 0.0–4.4)
Cholesterol, Total: 228 mg/dL — ABNORMAL HIGH (ref 100–199)
HDL: 43 mg/dL (ref >39)
LDL Chol Calc (NIH): 133 mg/dL — ABNORMAL HIGH (ref 0–99)
Triglycerides: 290 mg/dL — ABNORMAL HIGH (ref 0–149)
VLDL Cholesterol Cal: 52 mg/dL — ABNORMAL HIGH (ref 5–40)

## 2023-12-07 LAB — CMP14+EGFR
ALT: 8 IU/L (ref 0–32)
AST: 12 IU/L (ref 0–40)
Albumin: 4.4 g/dL (ref 3.8–4.8)
Alkaline Phosphatase: 109 IU/L (ref 44–121)
BUN/Creatinine Ratio: 8 — ABNORMAL LOW (ref 12–28)
BUN: 15 mg/dL (ref 8–27)
Bilirubin Total: <0.2 mg/dL (ref 0.0–1.2)
CO2: 19 mmol/L — ABNORMAL LOW (ref 20–29)
Calcium: 10.3 mg/dL (ref 8.7–10.3)
Chloride: 98 mmol/L (ref 96–106)
Creatinine, Ser: 1.92 mg/dL — ABNORMAL HIGH (ref 0.57–1.00)
Globulin, Total: 3.6 g/dL (ref 1.5–4.5)
Glucose: 93 mg/dL (ref 70–99)
Potassium: 4.6 mmol/L (ref 3.5–5.2)
Sodium: 136 mmol/L (ref 134–144)
Total Protein: 8 g/dL (ref 6.0–8.5)
eGFR: 26 mL/min/{1.73_m2} — ABNORMAL LOW (ref >59)

## 2023-12-07 LAB — HEMOGLOBIN A1C
Est. average glucose Bld gHb Est-mCnc: 117 mg/dL
Hgb A1c MFr Bld: 5.7 % — ABNORMAL HIGH (ref 4.8–5.6)

## 2023-12-07 LAB — VITAMIN D 25 HYDROXY (VIT D DEFICIENCY, FRACTURES): Vit D, 25-Hydroxy: 39.5 ng/mL (ref 30.0–100.0)

## 2023-12-07 LAB — TSH+FREE T4
Free T4: 0.87 ng/dL (ref 0.82–1.77)
TSH: 1.93 u[IU]/mL (ref 0.450–4.500)

## 2023-12-07 NOTE — Progress Notes (Signed)
 Cheryl Abbott     MRN: [id number]      DOB: 20-Nov-1945  Chief Complaint  Patient presents with   Medical Management of Chronic Issues    Follow up     HPI Cheryl Abbott is here for follow up and re-evaluation of chronic medical conditions, medication management and review of any available recent lab and radiology data.  Preventive health is updated, specifically  Cancer screening and Immunization.   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 The PT denies any adverse reactions to current medications since the last visit. Wa nts to keep tradjenta  for as needed use, however I explained that it is similar to  trulicity   and her blood sugar is near normal so this is being discontinued  Has had recurrence with kidney stone since last visit also ROS Denies recent fever or chills. Denies sinus pressure, nasal congestion, ear pain or sore throat. Denies chest congestion, productive cough or wheezing. Denies chest pains, palpitations and leg swelling Denies abdominal pain, nausea, vomiting,diarrhea or constipation.   Denies dysuria, frequency, hesitancy has controled onn medication,  incontinence. C/o mild to moderate intermittent  joint pain, and limitation in mobility. Denies headaches, seizures, numbness, or tingling. Denies uncontrolled depression, anxiety or insomnia. Denies skin break down or rash.   PE  BP 110/67   Pulse (!) 105   Resp 18   Ht 5' 6.5 (1.689 m)   Wt 179 lb 1.9 oz (81.2 kg)   SpO2 93%   BMI 28.48 kg/m   Patient alert and oriented and in no cardiopulmonary distress.  HEENT: No facial asymmetry, EOMI,     Neck supple .  Chest: Clear to auscultation bilaterally.  CVS: S1, S2 no murmurs, no S3.Regular rate.  ABD: Soft non tender.   Ext: No edema  MS: Adequate ROM spine, shoulders, hips and knees.  Skin: Intact, no ulcerations or rash noted.  Psych: Good eye contact, normal affect.  Memory intact not anxious or depressed appearing.  CNS: CN 2-12 intact, power,  normal throughout.no focal deficits noted.   Assessment & Plan  Anxiety and depression Controlled, no change in medication   CKD (chronic kidney disease) stage 3, GFR 30-59 ml/min (HCC) Needs foillow up appointment with nephrology will refer as seems she has not been for some time per her re call  Hyperlipidemia LDL goal <100 Hyperlipidemia:Low fat diet discussed and encouraged.   Lipid Panel  Lab Results  Component Value Date   CHOL 228 (H) 12/06/2023   HDL 43 12/06/2023   LDLCALC 133 (H) 12/06/2023   TRIG 290 (H) 12/06/2023   CHOLHDL 5.3 (H) 12/06/2023     Unconmtrolled , meed to verify medication she is currently taking and med adjustment neded  Hypertension goal BP (blood pressure) < 130/80 .conq1 DASH diet and commitment to daily physical activity for a minimum of 30 minutes discussed and encouraged, as a part of hypertension management. The importance of attaining a healthy weight is also discussed.     12/06/2023    1:02 PM 10/17/2023   11:31 AM 09/19/2023    1:44 PM 09/19/2023    8:07 AM 09/18/2023    9:44 AM 09/12/2023   11:55 AM 09/06/2023    1:56 PM  BP/Weight  Systolic BP 110 120 131 126 104 108 126  Diastolic BP 67 68 76 74 83 69 74  Wt. (Lbs) 179.12 178   180.34 179 181  BMI 28.48 kg/m2 28.73 kg/m2   29.11 kg/m2 28.89 kg/m2 29.21 kg/m2       Multiple myeloma (HCC) Most recent oncology note in 2025, smoldering  Smoldering myeloma (HCC) Reportesd in Oncology note in 2025 as smoldering  Type 2 diabetes with nephropathy (HCC) Diabetes associated with hypertension, hyperlipidemia, CKD, and depression  Cheryl Abbott is reminded of the importance of commitment to daily physical activity for 30 minutes or more, as able and the need to limit carbohydrate intake to 30 to 60 grams per meal to help with blood sugar control.   The need to take medication as prescribed, test blood  sugar as directed, and to call between visits if there is a concern that blood sugar is uncontrolled is also discussed.   Cheryl Abbott is reminded of the importance of daily foot exam, annual eye examination, and good blood sugar, blood pressure and cholesterol control.     Latest Ref Rng & Units 12/06/2023    1:49 PM 09/04/2023    2:30 AM 08/26/2023    8:55 AM 08/05/2023    9:09 AM 04/25/2023    4:48 PM  Diabetic Labs  HbA1c 4.8 - 5.6 % 5.7     5.9   Micro/Creat Ratio 0 - 29 mg/g creat     32   Chol 100 - 199 mg/dL 771    894  862   HDL >60 mg/dL 43    37  44   Calc LDL 0 - 99 mg/dL 866    46  44   Triglycerides 0 - 149 mg/dL 709    875  668   Creatinine 0.57 - 1.00 mg/dL 8.07  8.47  8.08  8.60  1.68       12/06/2023    1:02 PM 10/17/2023   11:31 AM 09/19/2023    1:44 PM 09/19/2023    8:07 AM 09/18/2023    9:44 AM 09/12/2023   11:55 AM 09/06/2023    1:56 PM  BP/Weight  Systolic BP 110 120 131 126 104 108 126  Diastolic BP 67 68 76 74 83 69 74  Wt. (Lbs) 179.12 178   180.34 179 181  BMI 28.48 kg/m2 28.73 kg/m2   29.11 kg/m2 28.89 kg/m2 29.21 kg/m2      Latest Ref Rng & Units 08/20/2022   12:00 AM 08/15/2021   12:00 AM  Foot/eye exam completion dates  Eye Exam No Retinopathy No Retinopathy     No Retinopathy         This result is from an external source.    Controlled , stop tradjenta     Urinary incontinence Controlled, no change in medication   Adenomatous polyps Multiple tubular adenomas of colon , rept colonoscopy past due. Discussed with pt and she wants colonoscopy, will refer to GI  again

## 2023-12-07 NOTE — Assessment & Plan Note (Signed)
 Hyperlipidemia:Low fat diet discussed and encouraged.   Lipid Panel  Lab Results  Component Value Date   CHOL 228 (H) 12/06/2023   HDL 43 12/06/2023   LDLCALC 133 (H) 12/06/2023   TRIG 290 (H) 12/06/2023   CHOLHDL 5.3 (H) 12/06/2023     Unconmtrolled , meed to verify medication she is currently taking and med adjustment neded

## 2023-12-07 NOTE — Assessment & Plan Note (Signed)
 Diabetes associated with hypertension, hyperlipidemia, CKD, and depression  Cheryl Abbott is reminded of the importance of commitment to daily physical activity for 30 minutes or more, as able and the need to limit carbohydrate intake to 30 to 60 grams per meal to help with blood sugar control.   The need to take medication as prescribed, test blood sugar as directed, and to call between visits if there is a concern that blood sugar is uncontrolled is also discussed.   Ms. Chesnut is reminded of the importance of daily foot exam, annual eye examination, and good blood sugar, blood pressure and cholesterol control.     Latest Ref Rng & Units 12/06/2023    1:49 PM 09/04/2023    2:30 AM 08/26/2023    8:55 AM 08/05/2023    9:09 AM 04/25/2023    4:48 PM  Diabetic Labs  HbA1c 4.8 - 5.6 % 5.7     5.9   Micro/Creat Ratio 0 - 29 mg/g creat     32   Chol 100 - 199 mg/dL 771    894  862   HDL >60 mg/dL 43    37  44   Calc LDL 0 - 99 mg/dL 866    46  44   Triglycerides 0 - 149 mg/dL 709    875  668   Creatinine 0.57 - 1.00 mg/dL 8.07  8.47  8.08  8.60  1.68       12/06/2023    1:02 PM 10/17/2023   11:31 AM 09/19/2023    1:44 PM 09/19/2023    8:07 AM 09/18/2023    9:44 AM 09/12/2023   11:55 AM 09/06/2023    1:56 PM  BP/Weight  Systolic BP 110 120 131 126 104 108 126  Diastolic BP 67 68 76 74 83 69 74  Wt. (Lbs) 179.12 178   180.34 179 181  BMI 28.48 kg/m2 28.73 kg/m2   29.11 kg/m2 28.89 kg/m2 29.21 kg/m2      Latest Ref Rng & Units 08/20/2022   12:00 AM 08/15/2021   12:00 AM  Foot/eye exam completion dates  Eye Exam No Retinopathy No Retinopathy     No Retinopathy         This result is from an external source.    Controlled , stop tradjenta 

## 2023-12-07 NOTE — Assessment & Plan Note (Signed)
 Controlled, no change in medication

## 2023-12-07 NOTE — Assessment & Plan Note (Signed)
 Reportesd in Oncology note in 2025 as smoldering

## 2023-12-07 NOTE — Assessment & Plan Note (Signed)
 Multiple tubular adenomas of colon , rept colonoscopy past due. Discussed with pt and she wants colonoscopy, will refer to GI  again

## 2023-12-07 NOTE — Assessment & Plan Note (Signed)
.  conq1 DASH diet and commitment to daily physical activity for a minimum of 30 minutes discussed and encouraged, as a part of hypertension management. The importance of attaining a healthy weight is also discussed.     12/06/2023    1:02 PM 10/17/2023   11:31 AM 09/19/2023    1:44 PM 09/19/2023    8:07 AM 09/18/2023    9:44 AM 09/12/2023   11:55 AM 09/06/2023    1:56 PM  BP/Weight  Systolic BP 110 120 131 126 104 108 126  Diastolic BP 67 68 76 74 83 69 74  Wt. (Lbs) 179.12 178   180.34 179 181  BMI 28.48 kg/m2 28.73 kg/m2   29.11 kg/m2 28.89 kg/m2 29.21 kg/m2

## 2023-12-07 NOTE — Assessment & Plan Note (Signed)
 Needs foillow up appointment with nephrology will refer as seems she has not been for some time per her re call

## 2023-12-07 NOTE — Assessment & Plan Note (Signed)
 Most recent oncology note in 2025, smoldering

## 2023-12-09 ENCOUNTER — Other Ambulatory Visit: Payer: Self-pay

## 2023-12-09 ENCOUNTER — Telehealth: Payer: Self-pay | Admitting: Family Medicine

## 2023-12-09 ENCOUNTER — Ambulatory Visit: Payer: Self-pay

## 2023-12-09 MED ORDER — BLOOD GLUCOSE TEST VI STRP: 1.0000 | ORAL_STRIP | Freq: Every day | 0 refills | Status: DC

## 2023-12-09 MED ORDER — LANCETS MISC. MISC: 1.0000 | Freq: Every day | 0 refills | Status: AC

## 2023-12-09 MED ORDER — LANCET DEVICE MISC: 1.0000 | Freq: Every day | 0 refills | Status: DC

## 2023-12-09 MED ORDER — BLOOD GLUCOSE MONITORING SUPPL DEVI
0 refills | Status: DC
Start: 2023-12-09 — End: 2024-01-13

## 2023-12-09 NOTE — Telephone Encounter (Signed)
 Copied from CRM [id number]. Topic: Referral - Question >> Dec 09, 2023 10:21 AM Kevelyn M wrote: Reason for CRM: Patient is requesting and MRI for right breast. Oncologist is requesting this.

## 2023-12-09 NOTE — Telephone Encounter (Signed)
 Ordered. Sent to [facility]

## 2023-12-10 ENCOUNTER — Other Ambulatory Visit: Payer: Self-pay

## 2023-12-10 ENCOUNTER — Other Ambulatory Visit: Payer: Self-pay | Admitting: *Deleted

## 2023-12-10 DIAGNOSIS — E785 Hyperlipidemia, unspecified: Secondary | ICD-10-CM

## 2023-12-10 DIAGNOSIS — N183 Chronic kidney disease, stage 3 unspecified: Secondary | ICD-10-CM

## 2023-12-10 MED ORDER — ANASTROZOLE 1 MG PO TABS: 1.0000 mg | ORAL_TABLET | Freq: Every day | ORAL | 3 refills | Status: AC

## 2023-12-10 NOTE — Telephone Encounter (Signed)
Pt informed

## 2023-12-12 ENCOUNTER — Ambulatory Visit (INDEPENDENT_AMBULATORY_CARE_PROVIDER_SITE_OTHER)

## 2023-12-12 DIAGNOSIS — E1121 Type 2 diabetes mellitus with diabetic nephropathy: Secondary | ICD-10-CM | POA: Diagnosis not present

## 2023-12-12 LAB — HM DIABETES EYE EXAM

## 2023-12-12 NOTE — Progress Notes (Signed)
 Arrived on 12/12/2023 and has given verbal consent to obtain images and complete their overdue diabetic retinal screening.  The images have been sent to an ophthalmologist or optometrist for review and interpretation.  Results will be sent back to [facility] for review.  Patient has been informed they will be contacted when we receive the results via telephone or MyChart.

## 2023-12-15 IMAGING — US US RENAL
1 series · 14 of 25 positions shown · non-contrast
Comparison: CT scan May 29, 2021

CLINICAL DATA: Follow-up left hydronephrosis. History of renal
stones.

EXAM:
RENAL / URINARY TRACT ULTRASOUND COMPLETE

[Series 1: us renal · 0.24mm/px · 14 of 73 slices shown]
[im 1/73]
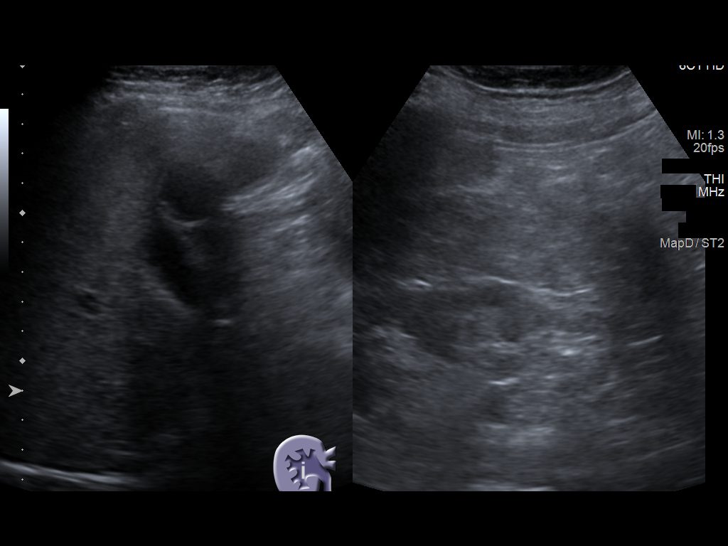
[im 7/73]
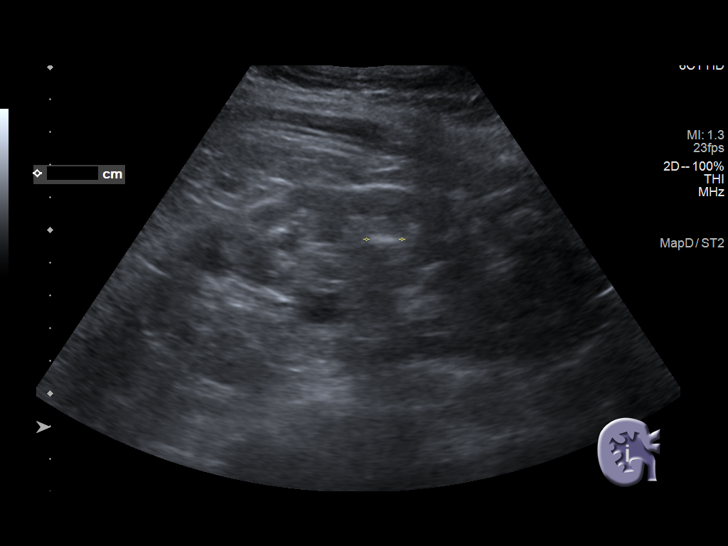
[im 13/73]
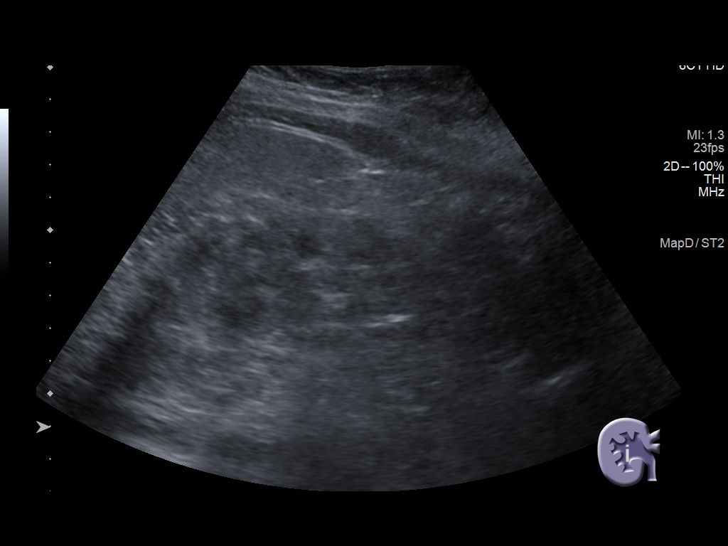
[im 19/73]
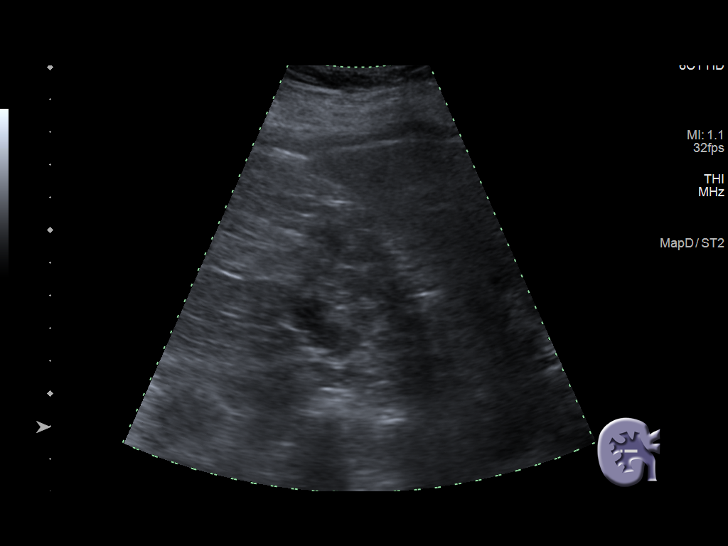
[im 25/73]
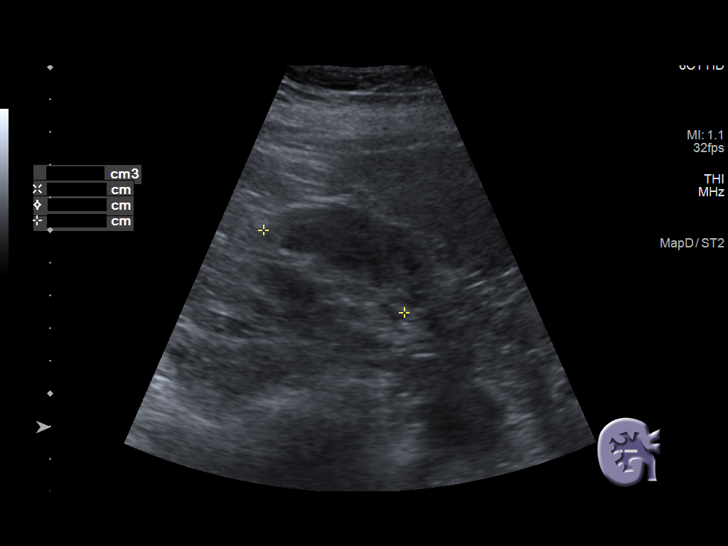
[im 28/73]
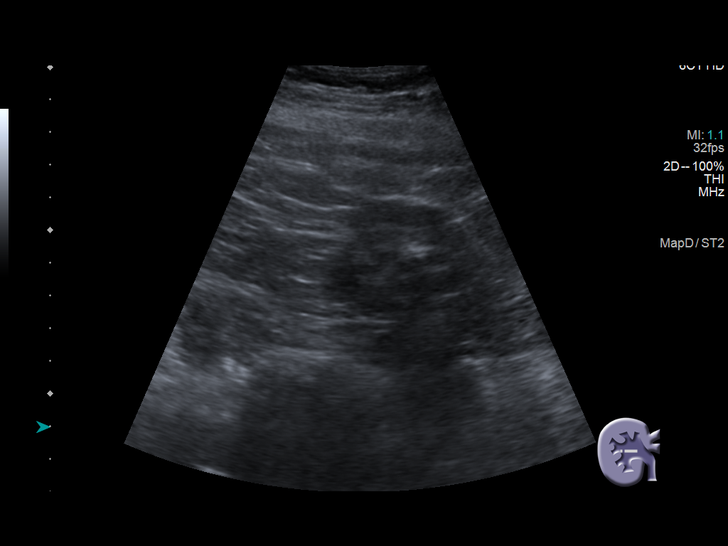
[im 34/73]
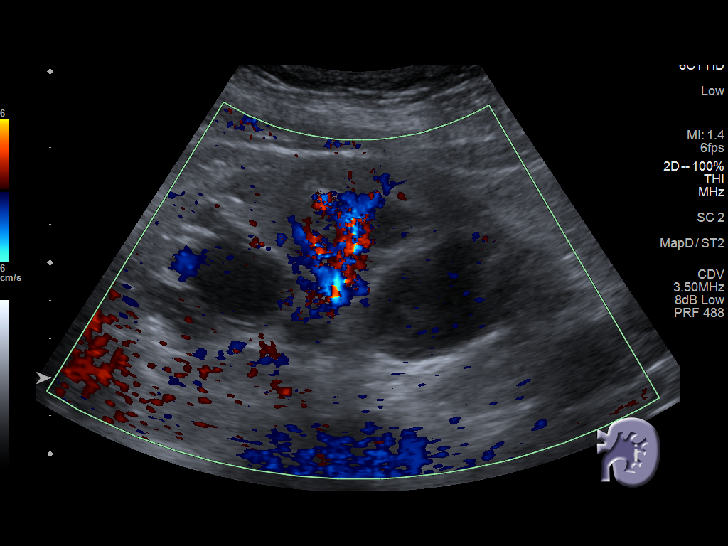
[im 40/73]
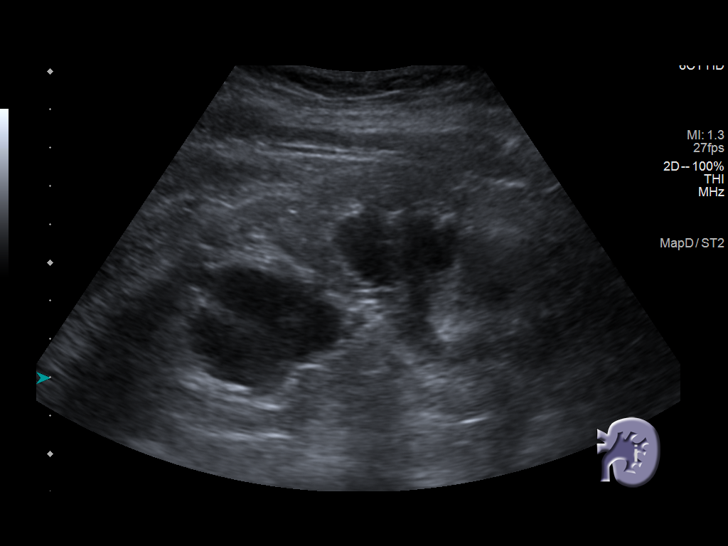
[im 46/73]
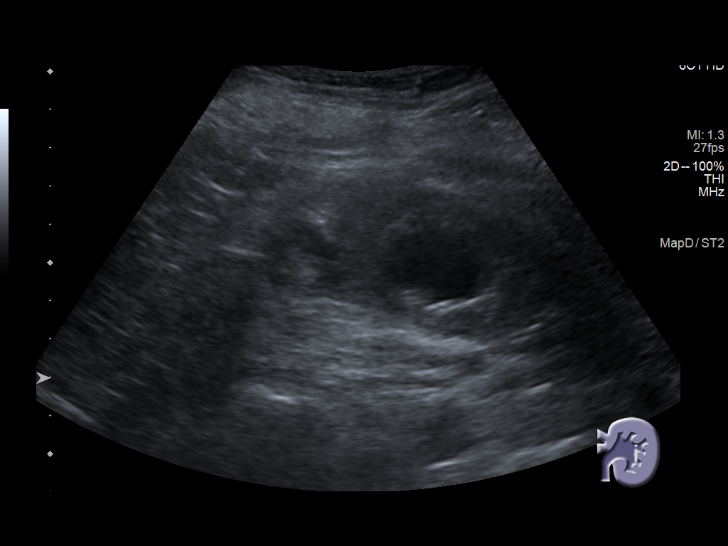
[im 49/73]
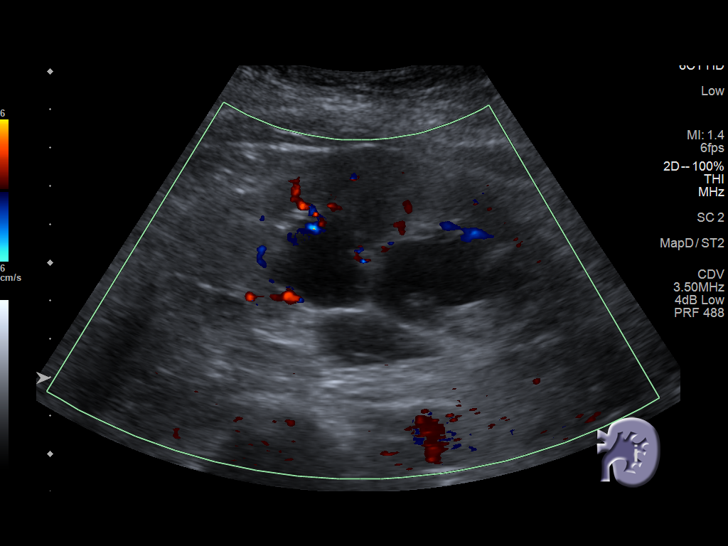
[im 55/73]
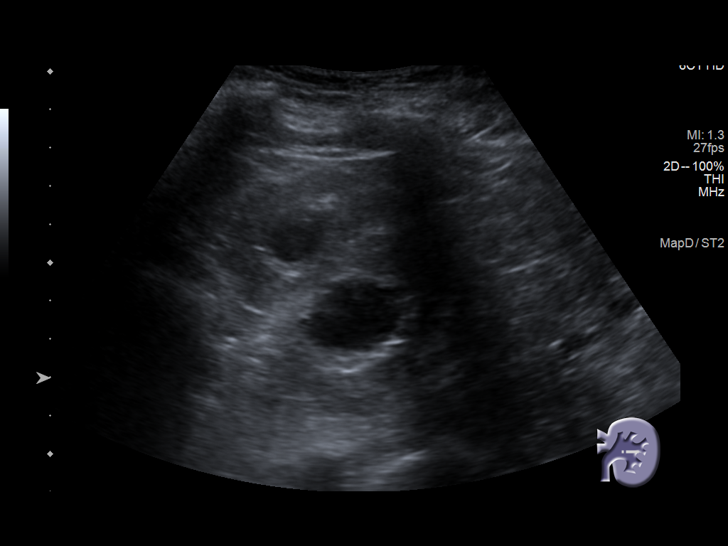
[im 61/73]
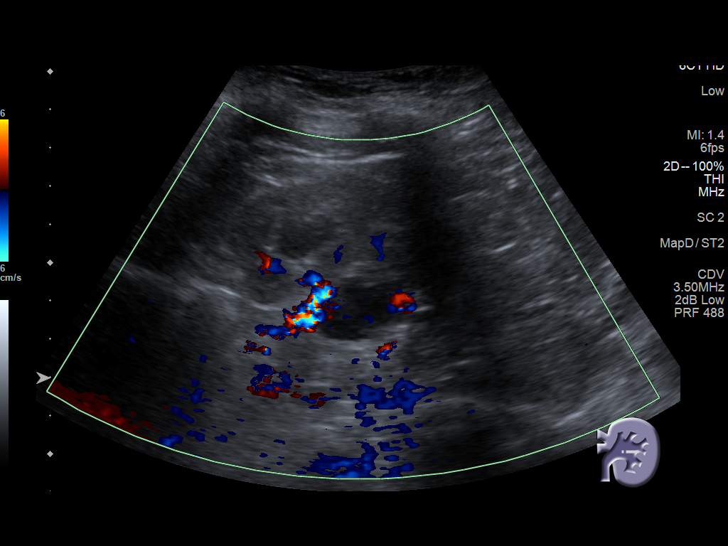
[im 67/73]
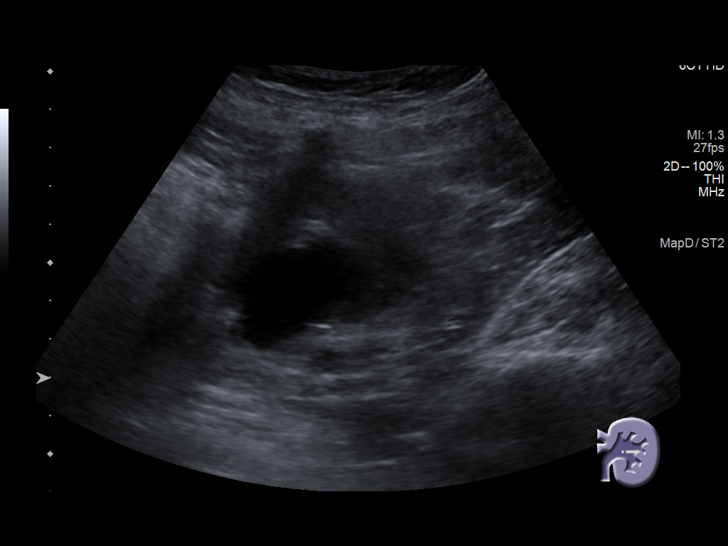
[im 73/73]
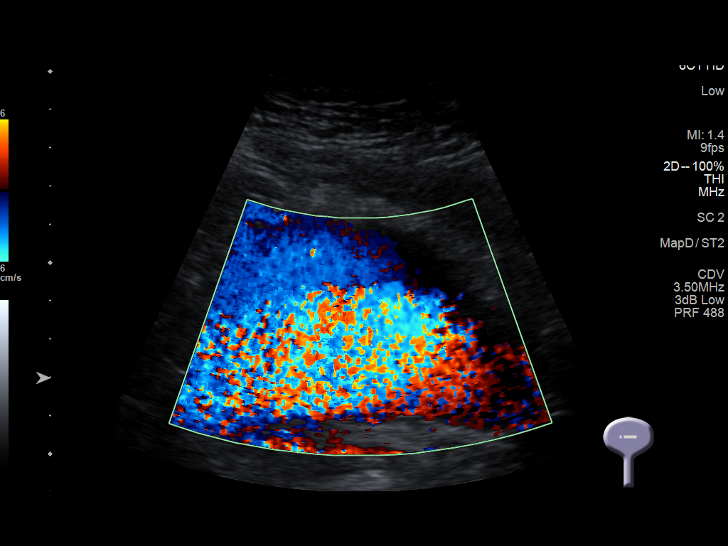

[14 of 25 positions shown; findings below may reference images not displayed]

FINDINGS: Right Kidney:

Renal measurements: 9.7 x 5.6 x 5.0 cm = volume: 141 mL. No
hydronephrosis. There is a 10.9 mm nonobstructive stone. There is a
17 mm cyst in the right kidney. Increased cortical echogenicity.

Left Kidney:

Renal measurements: 12.4 x 6.6 x 5.1 cm = volume: 216 mL. Persistent
moderate hydronephrosis. Several stones in the left kidney with the
largest measuring nearly 9 mm. Increased cortical echogenicity.

Bladder:

Appears normal for degree of bladder distention.

Other:

None.
IMPRESSION: 1. Moderate persistent hydronephrosis on the left. Bilateral renal
stones as above. 17 mm cyst in the right kidney.

## 2023-12-16 ENCOUNTER — Ambulatory Visit: Admitting: Gastroenterology

## 2023-12-16 ENCOUNTER — Encounter: Payer: Self-pay | Admitting: Gastroenterology

## 2023-12-16 VITALS — BP 101/57 | HR 106 | Temp 98.1°F | Ht 66.5 in | Wt 184.4 lb

## 2023-12-16 DIAGNOSIS — K5900 Constipation, unspecified: Secondary | ICD-10-CM | POA: Diagnosis not present

## 2023-12-16 DIAGNOSIS — K5909 Other constipation: Secondary | ICD-10-CM

## 2023-12-16 DIAGNOSIS — D509 Iron deficiency anemia, unspecified: Secondary | ICD-10-CM | POA: Diagnosis not present

## 2023-12-16 DIAGNOSIS — K219 Gastro-esophageal reflux disease without esophagitis: Secondary | ICD-10-CM | POA: Diagnosis not present

## 2023-12-16 DIAGNOSIS — D126 Benign neoplasm of colon, unspecified: Secondary | ICD-10-CM

## 2023-12-16 DIAGNOSIS — Z860101 Personal history of adenomatous and serrated colon polyps: Secondary | ICD-10-CM

## 2023-12-16 MED ORDER — SENNOSIDES 8.6 MG PO CAPS: ORAL_CAPSULE | ORAL | 3 refills | Status: DC

## 2023-12-16 NOTE — Progress Notes (Unsigned)
 GI Office Note    Referring Provider: Antonetta Rollene BRAVO, MD Primary Care Physician:  Antonetta Rollene BRAVO, MD  Primary Gastroenterologist: formerly Dr. Harvey  Chief Complaint   Chief Complaint  Patient presents with   Colonoscopy     History of Present Illness   Cheryl Abbott is a 78 y.o. female presenting today at the request of Dr. Antonetta to schedule surveillance colonoscopy. Last colonoscopy in 2021. She had one serrated polyp, six adenomas removed. Advised routine screen for polyps not advised after age of 56. She had colonoscopy in 2018 with 19 simple adenomas removed.   She has chronic anemia dating back to 06/2022. Hgb in the 10-11 range.   Diagnosed with right breast cancer in 08/2023. Unable to tolerate radiofrequency tag placement and mammography therefore not moving forward with surgery. Started on anastrozole  with plan to return in 12/2023.  Also with IgG kappa smoldering myeloma. Skeletal survey 08/2023 with new faint small rounded lucencies in right humeral neck. Plans to follow up xray in six months. Has anemia with iron  deficiency. Receives iron  infusions.   Today: Biggest problem is constipation. Has not been in 3-4 days. Chronic issues. Previously senokot-s was helpful but no longer on it (it was on medication list but she says she ran out couple of months ago). She takes magnesium  citrate at times if needed. Has hemorrhoid issues. They prolapse out at times. Hard to have BM due to hemorrhoids. No melena, brbpr. No abdominal pain. Heartburn controlled. No dysphagia. No vomiting.   Goes for labs with hematology this week. Has appt with Dr. Rogers.   ***any heart ***tcs???/egd??? Do we do one for ida    ***check egd ***bhunati   Medications   Current Outpatient Medications  Medication Sig Dispense Refill   acetaminophen  (TYLENOL ) 500 MG tablet Take 2 tablets (1,000 mg total) by mouth at bedtime.     amLODipine  (NORVASC ) 10 MG tablet TAKE 1  TABLET BY MOUTH EVERY DAY 90 tablet 1   anastrozole  (ARIMIDEX ) 1 MG tablet Take 1 tablet (1 mg total) by mouth daily. 90 tablet 3   aspirin  EC 81 MG tablet Take 81 mg by mouth daily. Swallow whole.     Blood Glucose Monitoring Suppl (ONE TOUCH ULTRA 2) w/Device KIT daily.     Blood Glucose Monitoring Suppl DEVI For once daily testing. May substitute to any manufacturer covered by patient's insurance. Dx E11.65 1 each 0   cholecalciferol (VITAMIN D3) 25 MCG (1000 UNIT) tablet Take 1,000 Units by mouth daily.     diclofenac  Sodium (VOLTAREN ) 1 % GEL APPLY 4 G TOPICALLY 4 (FOUR) TIMES DAILY AS NEEDED (PAIN). 400 g 5   diphenhydramine -acetaminophen  (TYLENOL  PM) 25-500 MG TABS tablet Take 2 tablets by mouth at bedtime.     Dulaglutide  (TRULICITY ) 0.75 MG/0.5ML SOAJ INJECT 0.75 MG INTO THE SKIN WEEKLY 2 mL 1   ezetimibe  (ZETIA ) 10 MG tablet TAKE 1 TABLET BY MOUTH EVERY DAY 90 tablet 1   folic acid  (FOLVITE ) 1 MG tablet Take 1 tablet (1 mg total) by mouth daily.     gabapentin  (NEURONTIN ) 300 MG capsule Take 1 capsule (300 mg total) by mouth 2 (two) times daily. 90 capsule 5   Glucose Blood (BLOOD GLUCOSE TEST STRIPS) STRP 1 each by In Vitro route daily. May substitute to any manufacturer covered by patient's insurance. DX E11.65 100 strip 0   Lancets Misc. MISC 1 each by Does not apply route daily. May substitute to any  manufacturer covered by [employer]. DX E11.65 100 each 0   Magnesium  400 MG CAPS Take 1 tablet by mouth daily. 30 capsule 11   mirabegron  ER (MYRBETRIQ ) 25 MG TB24 tablet Take 1 tablet (25 mg total) by mouth daily.     nitroGLYCERIN  (NITROSTAT ) 0.4 MG SL tablet PLACE 1 TABLET (0.4 MG TOTAL) UNDER THE TONGUE EVERY 5 (FIVE) MINUTES AS NEEDED FOR CHEST PAIN. UP TO 3 TABLETS, THEN CALL 911. 25 tablet 3   ONETOUCH ULTRA test strip USE AS DIRECTED 100 strip 5   pantoprazole  (PROTONIX ) 40 MG tablet TAKE 1 TABLET BY MOUTH TWICE A DAY 180 tablet 1   PARoxetine  (PAXIL ) 40 MG tablet TAKE 1  TABLET (40 MG TOTAL) BY MOUTH IN THE MORNING 90 tablet 1   sennosides-docusate sodium  (SENOKOT-S) 8.6-50 MG tablet Take 1 tablet by mouth daily. 30 tablet 2   SYMBICORT  80-4.5 MCG/ACT inhaler INHALE 2 PUFFS INTO THE LUNGS TWICE A DAY 30.6 each 4   traMADol  (ULTRAM ) 50 MG tablet TAKE 1 TABLET BY MOUTH EVERY 6 HOURS AS NEEDED 60 tablet 0   UNABLE TO FIND Diabetic shoes x 1 pair, inserts x 3 pair  DX E11.9 1 each 0   vitamin B-12 (VITAMIN B12) 500 MCG tablet Take 1 tablet (500 mcg total) by mouth daily.     No current facility-administered medications for this visit.   Facility-Administered Medications Ordered in Other Visits  Medication Dose Route Frequency Provider Last Rate Last Admin   0.9 %  sodium chloride  infusion   Intravenous Continuous Rogers Hai, MD   Stopped at 09/19/23 1343    Allergies   Allergies as of 12/16/2023 - Review Complete 12/16/2023  Allergen Reaction Noted   Ace inhibitors Cough 12/11/2010   Cephalexin Diarrhea and Nausea And Vomiting 07/24/2011   Nitrofurantoin  Diarrhea and Nausea And Vomiting 09/08/2009   Penicillins Hives and Other (See Comments) 09/17/2007    Past Medical History   Past Medical History:  Diagnosis Date   Anxiety disorder    Arthritis    Chronic back pain    Chronic pain of both shoulders    per pt told due to cervical spine pinched nerve   CKD (chronic kidney disease), stage III [facility])    nephrologist--- dr rachele;   due to tubular necrosis   Claustrophobia    Depression    Edema of both lower extremities    GERD (gastroesophageal reflux disease)    Headache    Hemorrhoids    w/ intermittant bleeding   History of adenomatous polyp of colon    History of kidney stones    Hx-TIA (transient ischemic attack) 02/26/2016   per pt no residual   Hydronephrosis, left    followed by urologist--- dr matilda;   persistant   Hyperlipidemia    Hypertension    Iron  deficiency    followed by [facility] w/ hx iron   infusions   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   oncologist--- [facility];  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, active survillence   Lactose intolerance in adult 02/01/2016   Left ureteral calculus    Lumbar disc disease with radiculopathy    Nephrolithiasis    per CT 08-11-2021  left > right   Neuropathy, peripheral    Nocturia more than twice per night    Normocytic anemia    OSA (obstructive sleep apnea)    per study 2007;   per pt no Bipap use since 2021  Renal atrophy, bilateral    Renal cyst, right    Sciatica    Sigmoid diverticulosis    Tick bite of ankle 07/28/2021   Type 2 diabetes mellitus (HCC)     Past Surgical History   Past Surgical History:  Procedure Laterality Date   BIOPSY N/A 03/17/2013   Procedure: GASTRIC BIOPSIES;  Surgeon: Margo LITTIE Haddock, MD;  Location: [facility];  Service: Endoscopy;  Laterality: N/A;   BIOPSY  06/10/2015   Procedure: BIOPSY;  Surgeon: Margo LITTIE Haddock, MD;  [facility];  Service: Endoscopy;;  gastric biopsy   BREAST BIOPSY Right 08/20/2023   path pending   BREAST BIOPSY Right 08/20/2023   US  RT BREAST BX W LOC DEV 1ST LESION IMG BX SPEC US  GUIDE 08/20/2023 Lennon Nest, MD [facility]   CARDIAC CATHETERIZATION  05/11/2003   @ [facility] by dr t. burnard;  Abnormal cardiolite /   Normal coronary arteries and normal LVF,  ef  63%   CARDIOVASCULAR STRESS TEST  01/01/2014   normal lexiscan  cardiolite /  no ischemia/ infarct/  normal LV function and wall motion ,  ef 81%   COLONOSCOPY WITH PROPOFOL  N/A 02/26/2017   Procedure: COLONOSCOPY WITH PROPOFOL ;  Surgeon: Haddock Margo LITTIE, MD;  [facility];  Service: Endoscopy;  Laterality: N/A;  11:00am   COLONOSCOPY WITH PROPOFOL  N/A 09/22/2019   Procedure: COLONOSCOPY WITH PROPOFOL ;  Surgeon: Haddock Margo LITTIE, MD;  [facility];  Service: Endoscopy;  Laterality: N/A;  1:15   CYSTO/   RIGHT URETEROSOCOPY LASER LITHOTRIPSY  STONE EXTRACTION  10/13/2004   CYSTO/  RIGHT RETROGRADE PYELOGRAM/  PLACMENT RIGHT URETERAL STENT  01/10/2010   CYSTOSCOPY W/ URETERAL STENT PLACEMENT Right 08/19/2015   Procedure: CYSTOSCOPY WITH RETROGRADE PYELOGRAM/URETERAL STENT PLACEMENT;  Surgeon: Garnette Shack, MD;  Location: [facility];  Service: Urology;  Laterality: Right;   CYSTOSCOPY W/ URETERAL STENT PLACEMENT Left 02/23/2020   Procedure: CYSTOSCOPY LEFT  RETROGRADE PYELOGRAM LEFT URETEROSCOPY URETERAL STENT PLACEMENT;  Surgeon: Shack Garnette, MD;  Location: [facility];  Service: Urology;  Laterality: Left;   CYSTOSCOPY W/ URETERAL STENT PLACEMENT Right 09/24/2022   Procedure: CYSTOSCOPY WITH RETROGRADE PYELOGRAM/URETERAL STENT PLACEMENT;  Surgeon: Sherrilee Belvie LITTIE, MD;  Location: [facility];  Service: Urology;  Laterality: Right;   CYSTOSCOPY W/ URETERAL STENT PLACEMENT Right 10/25/2022   Procedure: RIGHT URETERAL STENT EXCHANGE;  Surgeon: Sherrilee Belvie LITTIE, MD;  Location: [facility];  Service: Urology;  Laterality: Right;   CYSTOSCOPY WITH RETROGRADE PYELOGRAM, URETEROSCOPY AND STENT PLACEMENT Left 10/21/2014   Procedure: CYSTOSCOPY WITH RETROGRADE PYELOGRAM, URETEROSCOPY AND STENT PLACEMENT;  Surgeon: Garnette Shack, MD;  Location[facility];  Service: Urology;  Laterality: Left;   CYSTOSCOPY WITH RETROGRADE PYELOGRAM, URETEROSCOPY AND STENT PLACEMENT Right 11/22/2015   Procedure: CYSTOSCOPY, RIGHT URETERAL STENT REMOVAL; RIGHT RETROGRADE PYELOGRAM, RIGHT URETEROSCOPY WITH BALLOON DILATION; RIGHT URETERAL STENT PLACEMENT;  Surgeon: Garnette Shack, MD;  [facility]: [facility];  Service: Urology;  Laterality: Right;   CYSTOSCOPY WITH RETROGRADE PYELOGRAM, URETEROSCOPY AND STENT PLACEMENT Left 10/18/2020   Procedure: CYSTOSCOPY WITH LEFT RETROGRADE PYELOGRAM, LEFT URETEROSCOPY  WITH LASER AND LEFT URETERAL STENT PLACEMENT;  Surgeon: Shack Garnette, MD;  [facility]: [facility];  Service: Urology;  Laterality: Left;    CYSTOSCOPY WITH RETROGRADE PYELOGRAM, URETEROSCOPY AND STENT PLACEMENT Left 10/26/2021   Procedure: CYSTOSCOPY WITH RETROGRADE PYELOGRAM, URETEROSCOPY, STONE EXTRACTION  AND STENT PLACEMENT;  Surgeon: Shack Garnette, MD;  Location[facility];  Service: Urology;  Laterality:  Left;   CYSTOSCOPY WITH RETROGRADE PYELOGRAM, URETEROSCOPY AND STENT PLACEMENT Bilateral 10/25/2022   Procedure: CYSTOSCOPY WITH RETROGRADE PYELOGRAM, URETEROSCOPY;  Surgeon: Sherrilee Belvie CROME, MD;  [facility]: [facility];  Service: Urology;  Laterality: Bilateral;   CYSTOSCOPY/RETROGRADE/URETEROSCOPY/STONE EXTRACTION WITH BASKET Bilateral 08/02/2015   Procedure: CYSTOSCOPY; BILATERAL RETROGRADE PYELOGRAMS; BILATERAL URETEROSCOPY, STONE EXTRACTION WITH BASKET;  Surgeon: Garnette Shack, MD;  [facility];  Service: Urology;  Laterality: Bilateral;   CYSTOSCOPY/RETROGRADE/URETEROSCOPY/STONE EXTRACTION WITH BASKET Right 06/28/2015   Procedure: CYSTOSCOPY/RETROGRADE/URETEROSCOPY/STONE EXTRACTION WITH BASKET, RIGHT URETERAL DOUBLE J STENT PLACEMENT;  Surgeon: Garnette Shack, MD;  [facility]: [facility];  Service: Urology;  Laterality: Right;   ESOPHAGOGASTRODUODENOSCOPY (EGD) WITH PROPOFOL  N/A 03/17/2013   Procedure: ESOPHAGOGASTRODUODENOSCOPY (EGD) WITH PROPOFOL ;  Surgeon: Margo CROME Haddock, MD;  [facility]: [facility];  Service: Endoscopy;  Laterality: N/A;   ESOPHAGOGASTRODUODENOSCOPY (EGD) WITH PROPOFOL  N/A 06/10/2015   Distal gastritis, distal esophageal stricture s/p dilation   ESOPHAGOGASTRODUODENOSCOPY (EGD) WITH PROPOFOL  N/A 02/26/2017   Procedure: ESOPHAGOGASTRODUODENOSCOPY (EGD) WITH PROPOFOL ;  Surgeon: Haddock Margo CROME, MD;  [facility];  Service: Endoscopy;  Laterality: N/A;   FLEXIBLE SIGMOIDOSCOPY  09/11/2011   DOQ:[id number] Internal hemorrhoids   HOLMIUM LASER APPLICATION Left 10/21/2014   Procedure: HOLMIUM LASER APPLICATION;  Surgeon: Garnette Shack, MD;  Location: [facility];   Service: Urology;  Laterality: Left;   HOLMIUM LASER APPLICATION Right 06/28/2015   Procedure: HOLMIUM LASER APPLICATION;  Surgeon: Garnette Shack, MD;  [facility];  Service: Urology;  Laterality: Right;   HOLMIUM LASER APPLICATION  02/23/2020   Procedure: HOLMIUM LASER APPLICATION;  Surgeon: Shack Garnette, MD;  [facility];  Service: Urology;;   HOLMIUM LASER APPLICATION Left 10/18/2020   Procedure: HOLMIUM LASER APPLICATION;  Surgeon: Shack Garnette, MD;  [facility]: [facility];  Service: Urology;  Laterality: Left;   HOLMIUM LASER APPLICATION Right 10/25/2022   Procedure: HOLMIUM LASER APPLICATION;  Surgeon: Sherrilee Belvie CROME, MD;  [facility];  Service: Urology;  Laterality: Right;   MASS EXCISION Left 03/04/2015   Procedure: EXCISION OF SOFT TISSUE NEOPLASM LEFT ARM;  Surgeon: Oneil Budge, MD;  Location: [facility];  Service: General;  Laterality: Left;   PERCUTANEOUS NEPHROSTOLITHOTOMY Bilateral 05/2010   @[facility]    POLYPECTOMY N/A 03/17/2013   Procedure: GASTRIC POLYPECTOMY;  Surgeon: Margo CROME Haddock, MD;  [facility]: [facility];  Service: Endoscopy;  Laterality: N/A;   POLYPECTOMY  02/26/2017   Procedure: POLYPECTOMY;  Surgeon: Haddock Margo CROME, MD;  [facility];  Service: Endoscopy;;  polyp at cecum, ascending colon polyps x3, hepatic flexure polyps x8, transverse colon polyps x8    POLYPECTOMY  09/22/2019   Procedure: POLYPECTOMY;  Surgeon: Haddock Margo CROME, MD;  [facility];  Service: Endoscopy;;   REMOVAL RIGHT THIGH CYST  2006   SAVORY DILATION N/A 03/17/2013   Procedure: SAVORY DILATION;  Surgeon: Margo CROME Haddock, MD;  [facility]: [facility];  Service: Endoscopy;  Laterality: N/A;  #12.8, 14, 15, 16 dilators used   SAVORY DILATION N/A 06/10/2015   Procedure: SAVORY DILATION;  Surgeon: Margo CROME Haddock, MD;  [facility];  Service: Endoscopy;  Laterality: N/A;   VAGINAL HYSTERECTOMY  1970's    Past Family History   Family History   Problem Relation Age of Onset   Hypertension Mother    Diabetes Mother    Heart failure Mother    Dementia Mother    Emphysema Father    Hypertension Father    Diabetes Brother  GER disease Brother    Hypertension Sister    Hypertension Sister    Cancer Other        family history    Diabetes Other        family history    Heart defect Other        famiily history    Arthritis Other        family history    Anesthesia problems Neg Hx    Hypotension Neg Hx    Malignant hyperthermia Neg Hx    Pseudochol deficiency Neg Hx    Colon cancer Neg Hx     Past Social History   Social History   Socioeconomic History   Marital status: Divorced    Spouse name: Not on file   Number of children: 2   Years of education: 12   Highest education level: 12th grade  Occupational History   Occupation: retired   Tobacco Use   Smoking status: Former    Current packs/day: 0.00    Average packs/day: 1 pack/day for 20.0 years (20.0 ttl pk-yrs)    Types: Cigarettes    Start date: 09/21/1974    Quit date: 09/20/1988    Years since quitting: 35.2    Passive exposure: Past   Smokeless tobacco: Never  Vaping Use   Vaping status: Never Used  Substance and Sexual Activity   Alcohol use: No    Alcohol/week: 0.0 standard drinks of alcohol   Drug use: No   Sexual activity: Not Currently    Partners: Male    Birth control/protection: Surgical  Other Topics Concern   Not on file  Social History Narrative   Patient is right handed   Patient drinks some caffeine daily.   Social Drivers of Corporate investment banker Strain: Low Risk  (10/24/2022)   Overall Financial Resource Strain (CARDIA)    Difficulty of Paying Living Expenses: Not very hard  Food Insecurity: No Food Insecurity (10/24/2022)   Hunger Vital Sign    Worried About Running Out of Food in the Last Year: Never true    Ran Out of Food in the Last Year: Never true  Transportation Needs: No Transportation Needs (10/24/2022)    PRAPARE - Administrator, Civil Service (Medical): No    Lack of Transportation (Non-Medical): No  Physical Activity: Inactive (07/06/2022)   Exercise Vital Sign    Days of Exercise per Week: 0 days    Minutes of Exercise per Session: 0 min  Stress: No Stress Concern Present (07/06/2022)   [employer] of Occupational Health - Occupational Stress Questionnaire    Feeling of Stress : Not at all  Social Connections: Moderately Isolated (07/06/2022)   Social Connection and Isolation Panel    Frequency of Communication with Friends and Family: More than three times a week    Frequency of Social Gatherings with Friends and Family: Once a week    Attends Religious Services: More than 4 times per year    Active Member of [employer] or Organizations: No    Attends Banker Meetings: Never    Marital Status: Divorced  Catering manager Violence: Not At Risk (10/17/2022)   Humiliation, Afraid, Rape, and Kick questionnaire    Fear of Current or Ex-Partner: No    Emotionally Abused: No    Physically Abused: No    Sexually Abused: No    Review of Systems   General: Negative for anorexia, weight loss, fever, chills, fatigue, weakness Eyes: Negative  for vision changes.  ENT: Negative for hoarseness, difficulty swallowing , nasal congestion. CV: Negative for chest pain, angina, palpitations, dyspnea on exertion, peripheral edema.  Respiratory: Negative for dyspnea at rest, dyspnea on exertion, cough, sputum, wheezing.  GI: See history of present illness. GU:  Negative for dysuria, hematuria, urinary incontinence, urinary frequency, nocturnal urination.  MS: Negative for joint pain, low back pain.  Derm: Negative for rash or itching.  Neuro: Negative for weakness, abnormal sensation, seizure, frequent headaches, memory loss,  confusion.  Psych: Negative for anxiety, depression, suicidal ideation, hallucinations.  Endo: Negative for unusual weight change.  Heme: Negative for  bruising or bleeding. Allergy: Negative for rash or hives.  Physical Exam   BP (!) 101/57 (BP Location: Right Arm, Patient Position: Sitting, Cuff Size: Normal)   Pulse (!) 106   Temp 98.1 F (36.7 C) (Oral)   Ht 5' 6.5 (1.689 m)   Wt 184 lb 6.4 oz (83.6 kg)   SpO2 93%   BMI 29.32 kg/m    General: Well-nourished, well-developed in no acute distress. Sleepy today. Alert and oriented. Did not rest well last night.  Head: Normocephalic, atraumatic.   Eyes: Conjunctiva pink, no icterus. Mouth: Oropharyngeal mucosa moist and pink  Neck: Supple without thyromegaly, masses, or lymphadenopathy.  Lungs: Clear to auscultation bilaterally.  Heart: Regular rate and rhythm, no murmurs rubs or gallops.  Abdomen: Bowel sounds are normal, nontender, nondistended, no hepatosplenomegaly or masses,  no abdominal bruits or hernia, no rebound or guarding.   Rectal: not performed Extremities: No lower extremity edema. No clubbing or deformities.  Neuro: Alert and oriented x 4 , grossly normal neurologically.  Skin: Warm and dry, no rash or jaundice.   Psych: Alert and cooperative, normal mood and affect.  Labs   Lab Results  Component Value Date   TSH 1.930 12/06/2023   Lab Results  Component Value Date   HGBA1C 5.7 (H) 12/06/2023   Lab Results  Component Value Date   WBC 8.2 09/04/2023   HGB 10.6 (L) 09/04/2023   HCT 34.2 (L) 09/04/2023   MCV 89.8 09/04/2023   PLT 305 09/04/2023   Lab Results  Component Value Date   NA 136 12/06/2023   CL 98 12/06/2023   K 4.6 12/06/2023   CO2 19 (L) 12/06/2023   BUN 15 12/06/2023   CREATININE 1.92 (H) 12/06/2023   EGFR 26 (L) 12/06/2023   CALCIUM  10.3 12/06/2023   PHOS 2.7 09/20/2020   ALBUMIN 4.4 12/06/2023   GLUCOSE 93 12/06/2023   Lab Results  Component Value Date   ALT 8 12/06/2023   AST 12 12/06/2023   ALKPHOS 109 12/06/2023   BILITOT <0.2 12/06/2023   Lab Results  Component Value Date   IRON  61 08/26/2023   TIBC 514 (H)  08/26/2023   FERRITIN 5 (L) 08/26/2023    Imaging Studies   No results found.  Assessment/Plan:       PIERRETTE Sonny RAMAN. Ezzard, MHS, PA-C [facility]

## 2023-12-16 NOTE — Patient Instructions (Signed)
 Start back on sennosides two at bedtime for constipation. I sent in RX at [facility] but if insurance does not pay they can direct you to the over the counter sennosides.   We will reach out next week after your labs have resulted and see how your constipation is doing. May move forward with colonoscopy at that time.

## 2023-12-18 ENCOUNTER — Inpatient Hospital Stay: Attending: Hematology

## 2023-12-18 DIAGNOSIS — M129 Arthropathy, unspecified: Secondary | ICD-10-CM | POA: Diagnosis not present

## 2023-12-18 DIAGNOSIS — Z1721 Progesterone receptor positive status: Secondary | ICD-10-CM | POA: Diagnosis not present

## 2023-12-18 DIAGNOSIS — E1122 Type 2 diabetes mellitus with diabetic chronic kidney disease: Secondary | ICD-10-CM | POA: Insufficient documentation

## 2023-12-18 DIAGNOSIS — Z7951 Long term (current) use of inhaled steroids: Secondary | ICD-10-CM | POA: Diagnosis not present

## 2023-12-18 DIAGNOSIS — I129 Hypertensive chronic kidney disease with stage 1 through stage 4 chronic kidney disease, or unspecified chronic kidney disease: Secondary | ICD-10-CM | POA: Diagnosis not present

## 2023-12-18 DIAGNOSIS — Z7982 Long term (current) use of aspirin: Secondary | ICD-10-CM | POA: Insufficient documentation

## 2023-12-18 DIAGNOSIS — Z860101 Personal history of adenomatous and serrated colon polyps: Secondary | ICD-10-CM | POA: Diagnosis not present

## 2023-12-18 DIAGNOSIS — Z7984 Long term (current) use of oral hypoglycemic drugs: Secondary | ICD-10-CM | POA: Diagnosis not present

## 2023-12-18 DIAGNOSIS — E785 Hyperlipidemia, unspecified: Secondary | ICD-10-CM | POA: Diagnosis not present

## 2023-12-18 DIAGNOSIS — C50411 Malignant neoplasm of upper-outer quadrant of right female breast: Secondary | ICD-10-CM | POA: Insufficient documentation

## 2023-12-18 DIAGNOSIS — N183 Chronic kidney disease, stage 3 unspecified: Secondary | ICD-10-CM | POA: Diagnosis not present

## 2023-12-18 DIAGNOSIS — Z170 Estrogen receptor positive status [ER+]: Secondary | ICD-10-CM | POA: Diagnosis not present

## 2023-12-18 DIAGNOSIS — G8929 Other chronic pain: Secondary | ICD-10-CM | POA: Insufficient documentation

## 2023-12-18 DIAGNOSIS — C9000 Multiple myeloma not having achieved remission: Secondary | ICD-10-CM | POA: Insufficient documentation

## 2023-12-18 DIAGNOSIS — Z7985 Long-term (current) use of injectable non-insulin antidiabetic drugs: Secondary | ICD-10-CM | POA: Diagnosis not present

## 2023-12-18 DIAGNOSIS — R0902 Hypoxemia: Secondary | ICD-10-CM | POA: Diagnosis not present

## 2023-12-18 DIAGNOSIS — Z8673 Personal history of transient ischemic attack (TIA), and cerebral infarction without residual deficits: Secondary | ICD-10-CM | POA: Diagnosis not present

## 2023-12-18 DIAGNOSIS — M8580 Other specified disorders of bone density and structure, unspecified site: Secondary | ICD-10-CM | POA: Diagnosis not present

## 2023-12-18 DIAGNOSIS — Z79899 Other long term (current) drug therapy: Secondary | ICD-10-CM | POA: Insufficient documentation

## 2023-12-18 DIAGNOSIS — Z87891 Personal history of nicotine dependence: Secondary | ICD-10-CM | POA: Insufficient documentation

## 2023-12-18 DIAGNOSIS — G4733 Obstructive sleep apnea (adult) (pediatric): Secondary | ICD-10-CM | POA: Insufficient documentation

## 2023-12-18 DIAGNOSIS — D509 Iron deficiency anemia, unspecified: Secondary | ICD-10-CM | POA: Insufficient documentation

## 2023-12-18 DIAGNOSIS — R609 Edema, unspecified: Secondary | ICD-10-CM | POA: Insufficient documentation

## 2023-12-18 DIAGNOSIS — Z803 Family history of malignant neoplasm of breast: Secondary | ICD-10-CM | POA: Insufficient documentation

## 2023-12-18 DIAGNOSIS — R232 Flushing: Secondary | ICD-10-CM | POA: Insufficient documentation

## 2023-12-18 DIAGNOSIS — Z79811 Long term (current) use of aromatase inhibitors: Secondary | ICD-10-CM | POA: Insufficient documentation

## 2023-12-18 LAB — CBC WITH DIFFERENTIAL/PLATELET
Abs Immature Granulocytes: 0.02 K/uL (ref 0.00–0.07)
Basophils Absolute: 0 K/uL (ref 0.0–0.1)
Basophils Relative: 0 %
Eosinophils Absolute: 0.3 K/uL (ref 0.0–0.5)
Eosinophils Relative: 3 %
HCT: 36.1 % (ref 36.0–46.0)
Hemoglobin: 11.7 g/dL — ABNORMAL LOW (ref 12.0–15.0)
Immature Granulocytes: 0 %
Lymphocytes Relative: 8 %
Lymphs Abs: 0.8 K/uL (ref 0.7–4.0)
MCH: 32.6 pg (ref 26.0–34.0)
MCHC: 32.4 g/dL (ref 30.0–36.0)
MCV: 100.6 fL — ABNORMAL HIGH (ref 80.0–100.0)
Monocytes Absolute: 0.8 K/uL (ref 0.1–1.0)
Monocytes Relative: 9 %
Neutro Abs: 7.4 K/uL (ref 1.7–7.7)
Neutrophils Relative %: 80 %
Platelets: 295 K/uL (ref 150–400)
RBC: 3.59 MIL/uL — ABNORMAL LOW (ref 3.87–5.11)
RDW: 16.8 % — ABNORMAL HIGH (ref 11.5–15.5)
WBC: 9.3 K/uL (ref 4.0–10.5)
nRBC: 0 % (ref 0.0–0.2)

## 2023-12-18 LAB — IRON AND TIBC
Iron: 64 ug/dL (ref 28–170)
Saturation Ratios: 14 % (ref 10.4–31.8)
TIBC: 450 ug/dL (ref 250–450)
UIBC: 386 ug/dL

## 2023-12-18 LAB — FERRITIN: Ferritin: 13 ng/mL (ref 11–307)

## 2023-12-19 ENCOUNTER — Telehealth: Payer: Self-pay | Admitting: Gastroenterology

## 2023-12-19 DIAGNOSIS — R062 Wheezing: Secondary | ICD-10-CM | POA: Diagnosis not present

## 2023-12-19 DIAGNOSIS — R059 Cough, unspecified: Secondary | ICD-10-CM | POA: Diagnosis not present

## 2023-12-19 NOTE — Telephone Encounter (Signed)
 Please let patient know that her iron  and hgb improved some since her iron  infusion.  Offer colonoscopy with Dr. Cindie: Dx: h/o adenomatous colon polyps and IDA ASA 3, rm 1,2 ok Hold Trulicity  7 days Bisacodyl  10mg  daily for 3 days before prep starts Due to CKD, she has to have trilyte 

## 2023-12-23 NOTE — Telephone Encounter (Signed)
 LMOVM to call back

## 2023-12-24 MED ORDER — PEG 3350-KCL-NA BICARB-NACL 420 G PO SOLR
4000.0000 mL | Freq: Once | ORAL | 0 refills | Status: AC
Start: 2023-12-24 — End: 2023-12-24

## 2023-12-24 NOTE — Telephone Encounter (Signed)
 Called pt and made aware. She stated when she was in the office she was told it probably wasn't a good idea to have another one because of her kidneys. She stated she doesn't want this to cause her to go into renal failure. Please advise leslie thanks!

## 2023-12-24 NOTE — Progress Notes (Signed)
 [facility] - Closed [address] [place] [address]    Clinic Day:  12/25/2023  Referring physician: Antonetta Rollene BRAVO, MD  Patient Care Team: Antonetta Rollene BRAVO, MD as PCP - General (Family Medicine) Alvan Dorn FALCON, MD as PCP - Cardiology (Cardiology) Harvey Margo CROME, MD (Inactive) (Gastroenterology) Watt Rush, MD as Attending Physician (Urology) Jenel Carlin POUR, MD (Inactive) (Neurology) Matilda Senior, MD as Consulting Physician (Urology) Roz Anes, MD as Consulting Physician (Ophthalmology) Rogers Hai, MD as Medical Oncologist (Medical Oncology) Celestia Joesph SQUIBB, RN as Oncology Nurse Navigator (Medical Oncology)   ASSESSMENT & PLAN:   Assessment: 1.  Stage I (T1a N0 M0 G2) right breast UOQ invasive lobular carcinoma: - Abnormal screening mammogram on 06/28/2023 - Right breast diagnostic mammogram/ultrasound: Oval hypoechoic mass with slight margin irregularity in the right breast at 9 o'clock position, 8 cm from nipple measuring 0.3 x 0.3 x 0.3 cm.  Normal lymph nodes in the right axilla. - Right breast 9:00 biopsy (08/20/2023): Grade 2 invasive lobular carcinoma, E-cadherin negative.  Associated DCIS.  ER 60% positive, weak staining, PR 70% positive, weak to moderate staining, HER2 (0), Ki-67 <1%. - She was evaluated by breast radiology and Dr. Mavis.  She was unable to tolerate radiofrequency tag placement and mammography.  Hence she decided against surgery. - Anastrozole  to control her cancer was started on 09/18/2023.   2.  Social/family history: - She lives by herself at home and is independent of ADLs and IADLs.  Quit smoking 30 years ago. - 2 maternal first cousins had breast cancer.  Niece had breast cancer.  Maternal aunt had cancer in the stomach.   3.  IgG kappa smoldering myeloma: - Diagnosed in September 2018 after nephrology work-up found M spike in urine (8.3 mg per 24 hours) and serum (1.5 g/dL) - Immunofixation  shows IgG monoclonal protein with kappa light chain specificity - Bone marrow biopsy on 02/19/2017 showed trilineage hematopoiesis, plasma cells 14%, cytogenetics normal, FISH panel normal - Skeletal survey on 02/01/2020 did not show any lytic lesions. - Skeletal survey (02/16/2021): 2 adjacent 4 mm lucencies on the right scapula at the base of the glenoid, which were not seen on prior exam.  No other focal bone lesion. - Scapula x-ray (02/21/2021): Small lucent lesions in the scapula at the base of the glenoid, better seen on skeletal survey - Skeletal survey (11/29/2021): Stable lucencies in scapula on the right, no new lytic or destructive lesion is seen.    Plan: 1.  Stage I (T1a N0 M0 G2 ER/PR+, HER2-) right breast UOQ invasive lobular carcinoma: - She started anastrozole  on 09/18/2023. - She has some hot flashes but otherwise tolerating it very well. - Continue anastrozole  daily.  RTC 3 months for follow-up.  Will arrange for right breast diagnostic mammogram and ultrasound to evaluate response as the primary lesion was sub-5 mm.   2.  IgG kappa smoldering myeloma: - Myeloma labs on 08/26/2023: M spike is stable at 1.6 g.  Creatinine and calcium  are at her baseline. - Skeletal survey showed faint small rounded lucencies in the right humeral neck not seen on prior study.  Stable nonspecific small lucencies within the right scapula at the base of the glenoid, equivocal. - I will repeat a x-ray of the right humerus to follow-up on these lesions in 3 months.  Will also repeat myeloma labs.    3.  Anemia from iron  deficiency: - She received INFeD  on 09/19/2023.  Repeat labs show ferritin  improved to 13 from 5.  Hemoglobin is 11.7.  She still has some fatigue.  She denies any BRBPR/melena.  I have recommended 1 more infusion of INFeD  1 g.  We will repeat ferritin and iron  panel at next visit.  4.  Osteopenia: - DEXA scan on 03/30/2022: T-score -2.0. - Last vitamin D  level is 39.5.  Continue calcium   and vitamin D  supplements.  Will order a repeat DEXA scan in 1 year.    Orders Placed This Encounter  Procedures   DG Humerus Right    Standing Status:   Future    Expected Date:   12/25/2023    Expiration Date:   12/24/2024    Reason for Exam (SYMPTOM  OR DIAGNOSIS REQUIRED):   lucent lesions right humerus - MGUS    Preferred imaging location?:   [facility]   MM DIAG BREAST TOMO UNI RIGHT    Standing Status:   Future    Expected Date:   03/26/2024    Expiration Date:   12/24/2024    Reason for Exam (SYMPTOM  OR DIAGNOSIS REQUIRED):   breast cancer right breast    Preferred imaging location?:   [facility]   CBC with Differential    Standing Status:   Future    Expected Date:   03/23/2024    Expiration Date:   06/21/2024   Comprehensive metabolic panel    Standing Status:   Future    Expected Date:   03/23/2024    Expiration Date:   06/21/2024   Iron  and TIBC ([facility] ONLY)    Standing Status:   Future    Expected Date:   03/23/2024    Expiration Date:   06/21/2024   Ferritin    Standing Status:   Future    Expected Date:   03/23/2024    Expiration Date:   06/21/2024   Kappa/lambda light chains    Standing Status:   Future    Expected Date:   03/23/2024    Expiration Date:   06/21/2024   Protein electrophoresis, serum    Standing Status:   Future    Expected Date:   03/23/2024    Expiration Date:   06/21/2024      LILLETTE Hummingbird R Teague,acting as a scribe for Alean Stands, MD.,have documented all relevant documentation on the behalf of Alean Stands, MD,as directed by  Alean Stands, MD while in the presence of Alean Stands, MD.  I, Alean Stands MD, have reviewed the above documentation for accuracy and completeness, and I agree with the above.    Alean Stands, MD   7/16/202512:39 PM  CHIEF COMPLAINT:   Diagnosis: lobular right breast cancer; history of IgG kappa smoldering myeloma & iron  deficiency  anemia    Cancer Staging  Breast cancer of upper-outer quadrant of right female breast [facility]) Staging form: Breast, AJCC 8th Edition - Clinical stage from 08/26/2023: Stage IA (cT1a, cN0, cM0, G2, ER+, PR+, HER2-) - Unsigned    Prior Therapy: Intermittent IV iron  infusions (last in July 2023)   Current Therapy: Anastrozole    HISTORY OF PRESENT ILLNESS:   Oncology History   No history exists.     INTERVAL HISTORY:   Cheryl Abbott is a 78 y.o. female presenting to clinic today for follow up of lobular right breast cancer; history of IgG kappa smoldering myeloma & iron  deficiency anemia. She was last seen by me on 09/18/2023.  Today, she states that she is doing well overall. Her appetite level  is at 25%. Her energy level is at 25%.  PAST MEDICAL HISTORY:   Past Medical History: Past Medical History:  Diagnosis Date   Anxiety disorder    Arthritis    Chronic back pain    Chronic pain of both shoulders    per pt told due to cervical spine pinched nerve   CKD (chronic kidney disease), stage III [facility])    nephrologist--- dr rachele;   due to tubular necrosis   Claustrophobia    Depression    Edema of both lower extremities    GERD (gastroesophageal reflux disease)    Headache    Hemorrhoids    w/ intermittant bleeding   History of adenomatous polyp of colon    History of kidney stones    Hx-TIA (transient ischemic attack) 02/26/2016   per pt no residual   Hydronephrosis, left    followed by urologist--- dr matilda;   persistant   Hyperlipidemia    Hypertension    Iron  deficiency    followed by [facility] w/ hx iron  infusions   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   oncologist--- [facility];  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, active survillence   Lactose intolerance in adult 02/01/2016   Left ureteral calculus    Lumbar disc disease with radiculopathy    Nephrolithiasis    per CT 08-11-2021  left > right   Neuropathy, peripheral    Nocturia more  than twice per night    Normocytic anemia    OSA (obstructive sleep apnea)    per study 2007;   per pt no Bipap use since 2021   Renal atrophy, bilateral    Renal cyst, right    Sciatica    Sigmoid diverticulosis    Tick bite of ankle 07/28/2021   Type 2 diabetes mellitus (HCC)     Surgical History: Past Surgical History:  Procedure Laterality Date   BIOPSY N/A 03/17/2013   Procedure: GASTRIC BIOPSIES;  Surgeon: Margo LITTIE Haddock, MD;  Location: [facility];  Service: Endoscopy;  Laterality: N/A;   BIOPSY  06/10/2015   Procedure: BIOPSY;  Surgeon: Margo LITTIE Haddock, MD;  [facility];  Service: Endoscopy;;  gastric biopsy   BREAST BIOPSY Right 08/20/2023   path pending   BREAST BIOPSY Right 08/20/2023   US  RT BREAST BX W LOC DEV 1ST LESION IMG BX SPEC US  GUIDE 08/20/2023 Lennon Nest, MD [facility]-ULTRAS[facility]   CARDIAC CATHETERIZATION  05/11/2003   @ [facility] by dr t. burnard;  Abnormal cardiolite /   Normal coronary arteries and normal LVF,  ef  63%   CARDIOVASCULAR STRESS TEST  01/01/2014   normal lexiscan  cardiolite /  no ischemia/ infarct/  normal LV function and wall motion ,  ef 81%   COLONOSCOPY WITH PROPOFOL  N/A 02/26/2017   Procedure: COLONOSCOPY WITH PROPOFOL ;  Surgeon: Haddock Margo LITTIE, MD;  [facility];  Service: Endoscopy;  Laterality: N/A;  11:00am   COLONOSCOPY WITH PROPOFOL  N/A 09/22/2019   Procedure: COLONOSCOPY WITH PROPOFOL ;  Surgeon: Haddock Margo LITTIE, MD;  [facility];  Service: Endoscopy;  Laterality: N/A;  1:15   CYSTO/   RIGHT URETEROSOCOPY LASER LITHOTRIPSY STONE EXTRACTION  10/13/2004   CYSTO/  RIGHT RETROGRADE PYELOGRAM/  PLACMENT RIGHT URETERAL STENT  01/10/2010   CYSTOSCOPY W/ URETERAL STENT PLACEMENT Right 08/19/2015   Procedure: CYSTOSCOPY WITH RETROGRADE PYELOGRAM/URETERAL STENT PLACEMENT;  Surgeon: Garnette matilda, MD;  Location: [facility];  Service: Urology;  Laterality: Right;   CYSTOSCOPY W/ URETERAL STENT  PLACEMENT Left 02/23/2020   Procedure: CYSTOSCOPY LEFT  RETROGRADE PYELOGRAM LEFT URETEROSCOPY URETERAL STENT PLACEMENT;  Surgeon: Matilda Senior, MD;  Location: [facility];  Service: Urology;  Laterality: Left;   CYSTOSCOPY W/ URETERAL STENT PLACEMENT Right 09/24/2022   Procedure: CYSTOSCOPY WITH RETROGRADE PYELOGRAM/URETERAL STENT PLACEMENT;  Surgeon: Sherrilee Belvie CROME, MD;  Location: [facility];  Service: Urology;  Laterality: Right;   CYSTOSCOPY W/ URETERAL STENT PLACEMENT Right 10/25/2022   Procedure: RIGHT URETERAL STENT EXCHANGE;  Surgeon: Sherrilee Belvie CROME, MD;  Location: [facility];  Service: Urology;  Laterality: Right;   CYSTOSCOPY WITH RETROGRADE PYELOGRAM, URETEROSCOPY AND STENT PLACEMENT Left 10/21/2014   Procedure: CYSTOSCOPY WITH RETROGRADE PYELOGRAM, URETEROSCOPY AND STENT PLACEMENT;  Surgeon: Senior Matilda, MD;  Location: [facility];  Service: Urology;  Laterality: Left;   CYSTOSCOPY WITH RETROGRADE PYELOGRAM, URETEROSCOPY AND STENT PLACEMENT Right 11/22/2015   Procedure: CYSTOSCOPY, RIGHT URETERAL STENT REMOVAL; RIGHT RETROGRADE PYELOGRAM, RIGHT URETEROSCOPY WITH BALLOON DILATION; RIGHT URETERAL STENT PLACEMENT;  Surgeon: Senior Matilda, MD;  Location: [facility];  Service: Urology;  Laterality: Right;   CYSTOSCOPY WITH RETROGRADE PYELOGRAM, URETEROSCOPY AND STENT PLACEMENT Left 10/18/2020   Procedure: CYSTOSCOPY WITH LEFT RETROGRADE PYELOGRAM, LEFT URETEROSCOPY  WITH LASER AND LEFT URETERAL STENT PLACEMENT;  Surgeon: Matilda Senior, MD;  Location: [facility];  Service: Urology;  Laterality: Left;   CYSTOSCOPY WITH RETROGRADE PYELOGRAM, URETEROSCOPY AND STENT PLACEMENT Left 10/26/2021   Procedure: CYSTOSCOPY WITH RETROGRADE PYELOGRAM, URETEROSCOPY, STONE EXTRACTION  AND STENT PLACEMENT;  Surgeon: Matilda Senior, MD;  Location[facility];  Service: Urology;  Laterality: Left;   CYSTOSCOPY WITH RETROGRADE PYELOGRAM, URETEROSCOPY AND STENT PLACEMENT  Bilateral 10/25/2022   Procedure: CYSTOSCOPY WITH RETROGRADE PYELOGRAM, URETEROSCOPY;  Surgeon: Sherrilee Belvie CROME, MD;  Location: [facility];  Service: Urology;  Laterality: Bilateral;   CYSTOSCOPY/RETROGRADE/URETEROSCOPY/STONE EXTRACTION WITH BASKET Bilateral 08/02/2015   Procedure: CYSTOSCOPY; BILATERAL RETROGRADE PYELOGRAMS; BILATERAL URETEROSCOPY, STONE EXTRACTION WITH BASKET;  Surgeon: Senior Matilda, MD;  Location: [facility];  Service: Urology;  Laterality: Bilateral;   CYSTOSCOPY/RETROGRADE/URETEROSCOPY/STONE EXTRACTION WITH BASKET Right 06/28/2015   Procedure: CYSTOSCOPY/RETROGRADE/URETEROSCOPY/STONE EXTRACTION WITH BASKET, RIGHT URETERAL DOUBLE J STENT PLACEMENT;  Surgeon: Senior Matilda, MD;  Location: [facility];  Service: Urology;  Laterality: Right;   ESOPHAGOGASTRODUODENOSCOPY (EGD) WITH PROPOFOL  N/A 03/17/2013   Procedure: ESOPHAGOGASTRODUODENOSCOPY (EGD) WITH PROPOFOL ;  Surgeon: Margo CROME Haddock, MD;  Location: [facility];  Service: Endoscopy;  Laterality: N/A;   ESOPHAGOGASTRODUODENOSCOPY (EGD) WITH PROPOFOL  N/A 06/10/2015   Distal gastritis, distal esophageal stricture s/p dilation   ESOPHAGOGASTRODUODENOSCOPY (EGD) WITH PROPOFOL  N/A 02/26/2017   Procedure: ESOPHAGOGASTRODUODENOSCOPY (EGD) WITH PROPOFOL ;  Surgeon: Haddock Margo CROME, MD;  [facility];  Service: Endoscopy;  Laterality: N/A;   FLEXIBLE SIGMOIDOSCOPY  09/11/2011   DOQ:[id number] Internal hemorrhoids   HOLMIUM LASER APPLICATION Left 10/21/2014   Procedure: HOLMIUM LASER APPLICATION;  Surgeon: Senior Matilda, MD;  Location: [facility];  Service: Urology;  Laterality: Left;   HOLMIUM LASER APPLICATION Right 06/28/2015   Procedure: HOLMIUM LASER APPLICATION;  Surgeon: Senior Matilda, MD;  Location: [facility];  Service: Urology;  Laterality: Right;   HOLMIUM LASER APPLICATION  02/23/2020   Procedure: HOLMIUM LASER APPLICATION;  Surgeon: Matilda Senior, MD;  [facility];  Service: Urology;;    HOLMIUM LASER APPLICATION Left 10/18/2020   Procedure: HOLMIUM LASER APPLICATION;  Surgeon: Matilda Senior, MD;  Location: [facility];  Service: Urology;  Laterality: Left;   HOLMIUM LASER APPLICATION Right 10/25/2022  Procedure: HOLMIUM LASER APPLICATION;  Surgeon: Sherrilee Belvie CROME, MD;  Location: [facility];  Service: Urology;  Laterality: Right;   MASS EXCISION Left 03/04/2015   Procedure: EXCISION OF SOFT TISSUE NEOPLASM LEFT ARM;  Surgeon: Oneil Budge, MD;  Location: [facility];  Service: General;  Laterality: Left;   PERCUTANEOUS NEPHROSTOLITHOTOMY Bilateral 05/2010   @[facility]    POLYPECTOMY N/A 03/17/2013   Procedure: GASTRIC POLYPECTOMY;  Surgeon: Margo CROME Haddock, MD;  [facility]: [facility];  Service: Endoscopy;  Laterality: N/A;   POLYPECTOMY  02/26/2017   Procedure: POLYPECTOMY;  Surgeon: Haddock Margo CROME, MD;  [facility];  Service: Endoscopy;;  polyp at cecum, ascending colon polyps x3, hepatic flexure polyps x8, transverse colon polyps x8    POLYPECTOMY  09/22/2019   Procedure: POLYPECTOMY;  Surgeon: Haddock Margo CROME, MD;  [facility];  Service: Endoscopy;;   REMOVAL RIGHT THIGH CYST  2006   SAVORY DILATION N/A 03/17/2013   Procedure: SAVORY DILATION;  Surgeon: Margo CROME Haddock, MD;  Location: [facility];  Service: Endoscopy;  Laterality: N/A;  #12.8, 14, 15, 16 dilators used   SAVORY DILATION N/A 06/10/2015   Procedure: SAVORY DILATION;  Surgeon: Margo CROME Haddock, MD;  [facility];  Service: Endoscopy;  Laterality: N/A;   VAGINAL HYSTERECTOMY  1970's    Social History: Social History   Socioeconomic History   Marital status: Divorced    Spouse name: Not on file   Number of children: 2   Years of education: 12   Highest education level: 12th grade  Occupational History   Occupation: retired   Tobacco Use   Smoking status: Former    Current packs/day: 0.00    Average packs/day: 1 pack/day for 20.0 years (20.0 ttl pk-yrs)    Types: Cigarettes    Start  date: 09/21/1974    Quit date: 09/20/1988    Years since quitting: 35.2    Passive exposure: Past   Smokeless tobacco: Never  Vaping Use   Vaping status: Never Used  Substance and Sexual Activity   Alcohol use: No    Alcohol/week: 0.0 standard drinks of alcohol   Drug use: No   Sexual activity: Not Currently    Partners: Male    Birth control/protection: Surgical  Other Topics Concern   Not on file  Social History Narrative   Patient is right handed   Patient drinks some caffeine daily.   Social Drivers of Corporate investment banker Strain: Low Risk  (10/24/2022)   Overall Financial Resource Strain (CARDIA)    Difficulty of Paying Living Expenses: Not very hard  Food Insecurity: No Food Insecurity (10/24/2022)   Hunger Vital Sign    Worried About Running Out of Food in the Last Year: Never true    Ran Out of Food in the Last Year: Never true  Transportation Needs: No Transportation Needs (10/24/2022)   PRAPARE - Administrator, Civil Service (Medical): No    Lack of Transportation (Non-Medical): No  Physical Activity: Inactive (07/06/2022)   Exercise Vital Sign    Days of Exercise per Week: 0 days    Minutes of Exercise per Session: 0 min  Stress: No Stress Concern Present (07/06/2022)   [employer] of Occupational Health - Occupational Stress Questionnaire    Feeling of Stress : Not at all  Social Connections: Moderately Isolated (07/06/2022)   Social Connection and Isolation Panel    Frequency of Communication with Friends and Family: More than three  times a week    Frequency of Social Gatherings with Friends and Family: Once a week    Attends Religious Services: More than 4 times per year    Active Member of [employer] or Organizations: No    Attends Banker Meetings: Never    Marital Status: Divorced  Catering manager Violence: Not At Risk (10/17/2022)   Humiliation, Afraid, Rape, and Kick questionnaire    Fear of Current or Ex-Partner: No     Emotionally Abused: No    Physically Abused: No    Sexually Abused: No    Family History: Family History  Problem Relation Age of Onset   Hypertension Mother    Diabetes Mother    Heart failure Mother    Dementia Mother    Emphysema Father    Hypertension Father    Diabetes Brother    GER disease Brother    Hypertension Sister    Hypertension Sister    Cancer Other        family history    Diabetes Other        family history    Heart defect Other        famiily history    Arthritis Other        family history    Anesthesia problems Neg Hx    Hypotension Neg Hx    Malignant hyperthermia Neg Hx    Pseudochol deficiency Neg Hx    Colon cancer Neg Hx     Current Medications:  Current Outpatient Medications:    acetaminophen  (TYLENOL ) 500 MG tablet, Take 2 tablets (1,000 mg total) by mouth at bedtime., Disp: , Rfl:    albuterol  (VENTOLIN  HFA) 108 (90 Base) MCG/ACT inhaler, Inhale 2 puffs into the lungs 2 (two) times daily., Disp: , Rfl:    amLODipine  (NORVASC ) 10 MG tablet, TAKE 1 TABLET BY MOUTH EVERY DAY, Disp: 90 tablet, Rfl: 1   anastrozole  (ARIMIDEX ) 1 MG tablet, Take 1 tablet (1 mg total) by mouth daily., Disp: 90 tablet, Rfl: 3   aspirin  EC 81 MG tablet, Take 81 mg by mouth daily. Swallow whole., Disp: , Rfl:    benzonatate  (TESSALON ) 200 MG capsule, Take 200 mg by mouth 2 (two) times daily., Disp: , Rfl:    Blood Glucose Monitoring Suppl (ONE TOUCH ULTRA 2) w/Device KIT, daily., Disp: , Rfl:    Blood Glucose Monitoring Suppl DEVI, For once daily testing. May substitute to any manufacturer covered by patient's insurance. Dx E11.65, Disp: 1 each, Rfl: 0   cholecalciferol (VITAMIN D3) 25 MCG (1000 UNIT) tablet, Take 1,000 Units by mouth daily., Disp: , Rfl:    diclofenac  Sodium (VOLTAREN ) 1 % GEL, APPLY 4 G TOPICALLY 4 (FOUR) TIMES DAILY AS NEEDED (PAIN)., Disp: 400 g, Rfl: 5   diphenhydramine -acetaminophen  (TYLENOL  PM) 25-500 MG TABS tablet, Take 2 tablets by mouth at  bedtime., Disp: , Rfl:    Dulaglutide  (TRULICITY ) 0.75 MG/0.5ML SOAJ, INJECT 0.75 MG INTO THE SKIN WEEKLY, Disp: 2 mL, Rfl: 1   ezetimibe  (ZETIA ) 10 MG tablet, TAKE 1 TABLET BY MOUTH EVERY DAY, Disp: 90 tablet, Rfl: 1   folic acid  (FOLVITE ) 1 MG tablet, Take 1 tablet (1 mg total) by mouth daily., Disp: , Rfl:    gabapentin  (NEURONTIN ) 300 MG capsule, Take 1 capsule (300 mg total) by mouth 2 (two) times daily., Disp: 90 capsule, Rfl: 5   Glucose Blood (BLOOD GLUCOSE TEST STRIPS) STRP, 1 each by In Vitro route daily. May substitute to any  manufacturer covered by [employer]. DX E11.65, Disp: 100 strip, Rfl: 0   Lancets Misc. MISC, 1 each by Does not apply route daily. May substitute to any manufacturer covered by patient's insurance. DX E11.65, Disp: 100 each, Rfl: 0   Magnesium  400 MG CAPS, Take 1 tablet by mouth daily., Disp: 30 capsule, Rfl: 11   mirabegron  ER (MYRBETRIQ ) 25 MG TB24 tablet, Take 1 tablet (25 mg total) by mouth daily., Disp: , Rfl:    nitroGLYCERIN  (NITROSTAT ) 0.4 MG SL tablet, PLACE 1 TABLET (0.4 MG TOTAL) UNDER THE TONGUE EVERY 5 (FIVE) MINUTES AS NEEDED FOR CHEST PAIN. UP TO 3 TABLETS, THEN CALL 911., Disp: 25 tablet, Rfl: 3   ONETOUCH ULTRA test strip, USE AS DIRECTED, Disp: 100 strip, Rfl: 5   pantoprazole  (PROTONIX ) 40 MG tablet, TAKE 1 TABLET BY MOUTH TWICE A DAY, Disp: 180 tablet, Rfl: 1   PARoxetine  (PAXIL ) 40 MG tablet, TAKE 1 TABLET (40 MG TOTAL) BY MOUTH IN THE MORNING, Disp: 90 tablet, Rfl: 1   polyethylene glycol-electrolytes (NULYTELY ) 420 g solution, Take by mouth., Disp: , Rfl:    promethazine -dextromethorphan (PROMETHAZINE -DM) 6.25-15 MG/5ML syrup, SMARTSIG:5 Milliliter(s) By Mouth 1-3 Times Daily PRN, Disp: , Rfl:    Sennosides 8.6 MG CAPS, Take two tablets at bedtime, Disp: 60 capsule, Rfl: 3   SYMBICORT  80-4.5 MCG/ACT inhaler, INHALE 2 PUFFS INTO THE LUNGS TWICE A DAY, Disp: 30.6 each, Rfl: 4   TRADJENTA  5 MG TABS tablet, Take 5 mg by mouth daily., Disp:  , Rfl:    traMADol  (ULTRAM ) 50 MG tablet, TAKE 1 TABLET BY MOUTH EVERY 6 HOURS AS NEEDED, Disp: 60 tablet, Rfl: 0   UNABLE TO FIND, Diabetic shoes x 1 pair, inserts x 3 pair  DX E11.9, Disp: 1 each, Rfl: 0   vitamin B-12 (VITAMIN B12) 500 MCG tablet, Take 1 tablet (500 mcg total) by mouth daily., Disp: , Rfl:  No current facility-administered medications for this visit.  Facility-Administered Medications Ordered in Other Visits:    0.9 %  sodium chloride  infusion, , Intravenous, Continuous, Rogers Hai, MD, Stopped at 09/19/23 1343   Allergies: Allergies  Allergen Reactions   Ace Inhibitors Cough    Patient doesn't recall  Other Reaction(s): GI Intolerance   Cephalexin Diarrhea and Nausea And Vomiting    Other Reaction(s): GI Intolerance   Nitrofurantoin  Diarrhea and Nausea And Vomiting   Penicillins Hives and Other (See Comments)    REVIEW OF SYSTEMS:   Review of Systems  Constitutional:  Negative for chills, fatigue and fever.  HENT:   Negative for lump/mass, mouth sores, nosebleeds, sore throat and trouble swallowing.   Eyes:  Negative for eye problems.  Respiratory:  Positive for cough and shortness of breath.   Cardiovascular:  Negative for chest pain, leg swelling and palpitations.  Gastrointestinal:  Positive for constipation and nausea. Negative for abdominal pain, diarrhea and vomiting.  Genitourinary:  Negative for bladder incontinence, difficulty urinating, dysuria, frequency, hematuria and nocturia.   Musculoskeletal:  Negative for arthralgias, back pain, flank pain, myalgias and neck pain.  Skin:  Negative for itching and rash.  Neurological:  Positive for headaches and numbness. Negative for dizziness.  Hematological:  Does not bruise/bleed easily.  Psychiatric/Behavioral:  Negative for depression, sleep disturbance and suicidal ideas. The patient is not nervous/anxious.   All other systems reviewed and are negative.    VITALS:   Blood pressure 112/71,  pulse 98, temperature 98.2 F (36.8 C), temperature source Oral, resp. rate 18, weight  183 lb 6.8 oz (83.2 kg), SpO2 100%.  Wt Readings from Last 3 Encounters:  12/25/23 183 lb 6.8 oz (83.2 kg)  12/16/23 184 lb 6.4 oz (83.6 kg)  12/06/23 179 lb 1.9 oz (81.2 kg)    Body mass index is 29.16 kg/m.  Performance status (ECOG): 1 - Symptomatic but completely ambulatory  PHYSICAL EXAM:   Physical Exam Vitals and nursing note reviewed. Exam conducted with a chaperone present.  Constitutional:      Appearance: Normal appearance.  Cardiovascular:     Rate and Rhythm: Normal rate and regular rhythm.     Pulses: Normal pulses.     Heart sounds: Normal heart sounds.  Pulmonary:     Effort: Pulmonary effort is normal.     Breath sounds: Normal breath sounds.  Abdominal:     Palpations: Abdomen is soft. There is no hepatomegaly, splenomegaly or mass.     Tenderness: There is no abdominal tenderness.  Musculoskeletal:     Right lower leg: No edema.     Left lower leg: No edema.  Lymphadenopathy:     Cervical: No cervical adenopathy.     Right cervical: No superficial, deep or posterior cervical adenopathy.    Left cervical: No superficial, deep or posterior cervical adenopathy.     Upper Body:     Right upper body: No supraclavicular or axillary adenopathy.     Left upper body: No supraclavicular or axillary adenopathy.  Neurological:     General: No focal deficit present.     Mental Status: She is alert and oriented to person, place, and time.  Psychiatric:        Mood and Affect: Mood normal.        Behavior: Behavior normal.     LABS:   CBC     Component Value Date/Time   WBC 9.3 12/18/2023 1030   RBC 3.59 (L) 12/18/2023 1030   HGB 11.7 (L) 12/18/2023 1030   HGB 10.3 (L) 04/25/2023 1648   HCT 36.1 12/18/2023 1030   HCT 33.0 (L) 04/25/2023 1648   PLT 295 12/18/2023 1030   PLT 395 04/25/2023 1648   MCV 100.6 (H) 12/18/2023 1030   MCV 92 04/25/2023 1648   MCH 32.6  12/18/2023 1030   MCHC 32.4 12/18/2023 1030   RDW 16.8 (H) 12/18/2023 1030   RDW 18.1 (H) 04/25/2023 1648   LYMPHSABS 0.8 12/18/2023 1030   LYMPHSABS 1.6 10/11/2022 1109   MONOABS 0.8 12/18/2023 1030   EOSABS 0.3 12/18/2023 1030   EOSABS 0.3 10/11/2022 1109   BASOSABS 0.0 12/18/2023 1030   BASOSABS 0.1 10/11/2022 1109    CMP      Component Value Date/Time   NA 136 12/06/2023 1349   K 4.6 12/06/2023 1349   CL 98 12/06/2023 1349   CO2 19 (L) 12/06/2023 1349   GLUCOSE 93 12/06/2023 1349   GLUCOSE 111 (H) 09/04/2023 0230   BUN 15 12/06/2023 1349   CREATININE 1.92 (H) 12/06/2023 1349   CREATININE 1.53 (H) 11/18/2019 0910   CALCIUM  10.3 12/06/2023 1349   CALCIUM  10.5 11/28/2010 1057   PROT 8.0 12/06/2023 1349   ALBUMIN 4.4 12/06/2023 1349   AST 12 12/06/2023 1349   ALT 8 12/06/2023 1349   ALKPHOS 109 12/06/2023 1349   BILITOT <0.2 12/06/2023 1349   GFRNONAA 35 (L) 09/04/2023 0230   GFRNONAA 33 (L) 11/18/2019 0910   GFRAA 41 (L) 05/13/2020 0946   GFRAA 38 (L) 11/18/2019 0910     No results  found for: CEA1, CEA / No results found for: CEA1, CEA No results found for: PSA1 No results found for: CAN199 No results found for: RJW874  Lab Results  Component Value Date   TOTALPROTELP 8.7 (H) 08/26/2023   ALBUMINELP 4.0 08/26/2023   A1GS 0.2 08/26/2023   A2GS 1.1 (H) 08/26/2023   BETS 1.3 08/26/2023   BETA2SER 6.9 (H) 11/22/2010   GAMS 2.1 (H) 08/26/2023   MSPIKE 1.6 (H) 08/26/2023   SPEI Comment 08/26/2023   Lab Results  Component Value Date   TIBC 450 12/18/2023   TIBC 514 (H) 08/26/2023   TIBC 465 (H) 06/13/2022   FERRITIN 13 12/18/2023   FERRITIN 5 (L) 08/26/2023   FERRITIN 12 06/13/2022   IRONPCTSAT 14 12/18/2023   IRONPCTSAT 12 08/26/2023   IRONPCTSAT 11 06/13/2022   Lab Results  Component Value Date   LDH 101 06/13/2022   LDH 102 03/06/2022   LDH 123 11/29/2021     STUDIES:   No results found.

## 2023-12-24 NOTE — Addendum Note (Signed)
 Addended by: JEANELL GRAEME RAMAN on: 12/24/2023 02:42 PM   Modules accepted: Orders

## 2023-12-24 NOTE — Telephone Encounter (Signed)
 Spoke with pt. She agree'd to colonoscopy. Scheduled for 8/12 as she has been sick for a week and wants to get better 1st. Aware will send instructions to her in the mail and rx for prep to the pharmacy.

## 2023-12-24 NOTE — Telephone Encounter (Signed)
 When she was in office we did discuss that with her chronic kidney disease we have to be careful with bowel prep choices.   She has history of numerous polyps on prior colonoscopies, 19 adenomas in 2018, 6 adenomas in 2021.   She technically aged out of surveillance colonoscopies but PCP referred back in 2023 and 2025 for history of polyps.   Given recent needs for iron  infusion, patient and I discussed colonoscopy could be considered.   Let her know if she does not want a colonoscopy we can hold off. She should let her PCP know that she had decided against colonoscopy. Cannot rule out possibility of significant colon lesion contributing to her iron  deficiency.

## 2023-12-25 ENCOUNTER — Inpatient Hospital Stay (HOSPITAL_BASED_OUTPATIENT_CLINIC_OR_DEPARTMENT_OTHER): Admitting: Hematology

## 2023-12-25 ENCOUNTER — Other Ambulatory Visit (HOSPITAL_COMMUNITY): Payer: Self-pay | Admitting: Hematology

## 2023-12-25 VITALS — BP 112/71 | HR 98 | Temp 98.2°F | Resp 18 | Wt 183.4 lb

## 2023-12-25 DIAGNOSIS — Z8673 Personal history of transient ischemic attack (TIA), and cerebral infarction without residual deficits: Secondary | ICD-10-CM | POA: Diagnosis not present

## 2023-12-25 DIAGNOSIS — D509 Iron deficiency anemia, unspecified: Secondary | ICD-10-CM | POA: Diagnosis not present

## 2023-12-25 DIAGNOSIS — Z170 Estrogen receptor positive status [ER+]: Secondary | ICD-10-CM | POA: Diagnosis not present

## 2023-12-25 DIAGNOSIS — E785 Hyperlipidemia, unspecified: Secondary | ICD-10-CM | POA: Diagnosis not present

## 2023-12-25 DIAGNOSIS — R609 Edema, unspecified: Secondary | ICD-10-CM | POA: Diagnosis not present

## 2023-12-25 DIAGNOSIS — Z79811 Long term (current) use of aromatase inhibitors: Secondary | ICD-10-CM | POA: Diagnosis not present

## 2023-12-25 DIAGNOSIS — D508 Other iron deficiency anemias: Secondary | ICD-10-CM

## 2023-12-25 DIAGNOSIS — R232 Flushing: Secondary | ICD-10-CM | POA: Diagnosis not present

## 2023-12-25 DIAGNOSIS — D472 Monoclonal gammopathy: Secondary | ICD-10-CM

## 2023-12-25 DIAGNOSIS — C50411 Malignant neoplasm of upper-outer quadrant of right female breast: Secondary | ICD-10-CM | POA: Diagnosis not present

## 2023-12-25 DIAGNOSIS — Z7985 Long-term (current) use of injectable non-insulin antidiabetic drugs: Secondary | ICD-10-CM | POA: Diagnosis not present

## 2023-12-25 DIAGNOSIS — Z7982 Long term (current) use of aspirin: Secondary | ICD-10-CM | POA: Diagnosis not present

## 2023-12-25 DIAGNOSIS — G8929 Other chronic pain: Secondary | ICD-10-CM | POA: Diagnosis not present

## 2023-12-25 DIAGNOSIS — Z7951 Long term (current) use of inhaled steroids: Secondary | ICD-10-CM | POA: Diagnosis not present

## 2023-12-25 DIAGNOSIS — Z860101 Personal history of adenomatous and serrated colon polyps: Secondary | ICD-10-CM | POA: Diagnosis not present

## 2023-12-25 DIAGNOSIS — Z87891 Personal history of nicotine dependence: Secondary | ICD-10-CM | POA: Diagnosis not present

## 2023-12-25 DIAGNOSIS — M129 Arthropathy, unspecified: Secondary | ICD-10-CM | POA: Diagnosis not present

## 2023-12-25 DIAGNOSIS — Z853 Personal history of malignant neoplasm of breast: Secondary | ICD-10-CM

## 2023-12-25 DIAGNOSIS — M8580 Other specified disorders of bone density and structure, unspecified site: Secondary | ICD-10-CM | POA: Diagnosis not present

## 2023-12-25 DIAGNOSIS — G4733 Obstructive sleep apnea (adult) (pediatric): Secondary | ICD-10-CM | POA: Diagnosis not present

## 2023-12-25 DIAGNOSIS — I129 Hypertensive chronic kidney disease with stage 1 through stage 4 chronic kidney disease, or unspecified chronic kidney disease: Secondary | ICD-10-CM | POA: Diagnosis not present

## 2023-12-25 DIAGNOSIS — C9000 Multiple myeloma not having achieved remission: Secondary | ICD-10-CM | POA: Diagnosis not present

## 2023-12-25 DIAGNOSIS — N183 Chronic kidney disease, stage 3 unspecified: Secondary | ICD-10-CM | POA: Diagnosis not present

## 2023-12-25 DIAGNOSIS — E1122 Type 2 diabetes mellitus with diabetic chronic kidney disease: Secondary | ICD-10-CM | POA: Diagnosis not present

## 2023-12-25 DIAGNOSIS — Z1721 Progesterone receptor positive status: Secondary | ICD-10-CM | POA: Diagnosis not present

## 2023-12-25 DIAGNOSIS — Z79899 Other long term (current) drug therapy: Secondary | ICD-10-CM | POA: Diagnosis not present

## 2023-12-25 DIAGNOSIS — Z7984 Long term (current) use of oral hypoglycemic drugs: Secondary | ICD-10-CM | POA: Diagnosis not present

## 2023-12-25 NOTE — Patient Instructions (Addendum)
[facility] at [facility] Discharge Instructions   You were seen and examined today by Dr. Ellin Saba.  He reviewed the results of your lab work which are normal/stable.   We will see you back in 3 months.   Return as scheduled.    Thank you for choosing [facility] at [facility] to provide your oncology and hematology care.  To afford each patient quality time with our provider, please arrive at least 15 minutes before your scheduled appointment time.   If you have a lab appointment with the Cancer Center please come in thru the Main Entrance and check in at the main information desk.  You need to re-schedule your appointment should you arrive 10 or more minutes late.  We strive to give you quality time with our providers, and arriving late affects you and other patients whose appointments are after yours.  Also, if you no show three or more times for appointments you may be dismissed from the clinic at the providers discretion.     Again, thank you for choosing [facility].  Our hope is that these requests will decrease the amount of time that you wait before being seen by our physicians.       _____________________________________________________________  Should you have questions after your visit to [facility], please contact our office at [phone] and follow the prompts.  Our office hours are 8:00 a.m. and 4:30 p.m. Monday - Friday.  Please note that voicemails left after 4:00 p.m. may not be returned until the following business day.  We are closed weekends and major holidays.  You do have access to a nurse 24-7, just call the main number to the clinic [phone] and do not press any options, hold on the line and a nurse will answer the phone.    For prescription refill requests, have your pharmacy contact our office and allow 72 hours.    Due to Covid, you will need to wear a mask upon entering the  hospital. If you do not have a mask, a mask will be given to you at the Main Entrance upon arrival. For doctor visits, patients may have 1 support person age 28 or older with them. For treatment visits, patients can not have anyone with them due to social distancing guidelines and our immunocompromised population.

## 2023-12-26 ENCOUNTER — Encounter: Payer: Self-pay | Admitting: Hematology

## 2023-12-27 ENCOUNTER — Encounter: Payer: Self-pay | Admitting: Hematology

## 2023-12-31 ENCOUNTER — Other Ambulatory Visit: Payer: Self-pay | Admitting: Cardiology

## 2023-12-31 ENCOUNTER — Other Ambulatory Visit: Payer: Self-pay | Admitting: Family Medicine

## 2024-01-02 ENCOUNTER — Inpatient Hospital Stay

## 2024-01-02 VITALS — BP 130/68 | HR 90 | Temp 98.0°F | Resp 18

## 2024-01-02 DIAGNOSIS — R232 Flushing: Secondary | ICD-10-CM | POA: Diagnosis not present

## 2024-01-02 DIAGNOSIS — Z860101 Personal history of adenomatous and serrated colon polyps: Secondary | ICD-10-CM | POA: Diagnosis not present

## 2024-01-02 DIAGNOSIS — Z79811 Long term (current) use of aromatase inhibitors: Secondary | ICD-10-CM | POA: Diagnosis not present

## 2024-01-02 DIAGNOSIS — G8929 Other chronic pain: Secondary | ICD-10-CM | POA: Diagnosis not present

## 2024-01-02 DIAGNOSIS — Z79899 Other long term (current) drug therapy: Secondary | ICD-10-CM | POA: Diagnosis not present

## 2024-01-02 DIAGNOSIS — I129 Hypertensive chronic kidney disease with stage 1 through stage 4 chronic kidney disease, or unspecified chronic kidney disease: Secondary | ICD-10-CM | POA: Diagnosis not present

## 2024-01-02 DIAGNOSIS — Z8673 Personal history of transient ischemic attack (TIA), and cerebral infarction without residual deficits: Secondary | ICD-10-CM | POA: Diagnosis not present

## 2024-01-02 DIAGNOSIS — D508 Other iron deficiency anemias: Secondary | ICD-10-CM

## 2024-01-02 DIAGNOSIS — Z7951 Long term (current) use of inhaled steroids: Secondary | ICD-10-CM | POA: Diagnosis not present

## 2024-01-02 DIAGNOSIS — D509 Iron deficiency anemia, unspecified: Secondary | ICD-10-CM | POA: Diagnosis not present

## 2024-01-02 DIAGNOSIS — M129 Arthropathy, unspecified: Secondary | ICD-10-CM | POA: Diagnosis not present

## 2024-01-02 DIAGNOSIS — M8580 Other specified disorders of bone density and structure, unspecified site: Secondary | ICD-10-CM | POA: Diagnosis not present

## 2024-01-02 DIAGNOSIS — N183 Chronic kidney disease, stage 3 unspecified: Secondary | ICD-10-CM | POA: Diagnosis not present

## 2024-01-02 DIAGNOSIS — Z7982 Long term (current) use of aspirin: Secondary | ICD-10-CM | POA: Diagnosis not present

## 2024-01-02 DIAGNOSIS — E785 Hyperlipidemia, unspecified: Secondary | ICD-10-CM | POA: Diagnosis not present

## 2024-01-02 DIAGNOSIS — Z7985 Long-term (current) use of injectable non-insulin antidiabetic drugs: Secondary | ICD-10-CM | POA: Diagnosis not present

## 2024-01-02 DIAGNOSIS — Z1721 Progesterone receptor positive status: Secondary | ICD-10-CM | POA: Diagnosis not present

## 2024-01-02 DIAGNOSIS — E1122 Type 2 diabetes mellitus with diabetic chronic kidney disease: Secondary | ICD-10-CM | POA: Diagnosis not present

## 2024-01-02 DIAGNOSIS — C9000 Multiple myeloma not having achieved remission: Secondary | ICD-10-CM | POA: Diagnosis not present

## 2024-01-02 DIAGNOSIS — Z87891 Personal history of nicotine dependence: Secondary | ICD-10-CM | POA: Diagnosis not present

## 2024-01-02 DIAGNOSIS — R609 Edema, unspecified: Secondary | ICD-10-CM | POA: Diagnosis not present

## 2024-01-02 DIAGNOSIS — Z7984 Long term (current) use of oral hypoglycemic drugs: Secondary | ICD-10-CM | POA: Diagnosis not present

## 2024-01-02 DIAGNOSIS — Z170 Estrogen receptor positive status [ER+]: Secondary | ICD-10-CM | POA: Diagnosis not present

## 2024-01-02 DIAGNOSIS — G4733 Obstructive sleep apnea (adult) (pediatric): Secondary | ICD-10-CM | POA: Diagnosis not present

## 2024-01-02 DIAGNOSIS — C50411 Malignant neoplasm of upper-outer quadrant of right female breast: Secondary | ICD-10-CM | POA: Diagnosis not present

## 2024-01-02 MED ORDER — CETIRIZINE HCL 10 MG/ML IV SOLN
10.0000 mg | Freq: Once | INTRAVENOUS | Status: AC
  Administered 2024-01-02: 10 mg via INTRAVENOUS
  Filled 2024-01-02: qty 1

## 2024-01-02 MED ORDER — SODIUM CHLORIDE 0.9 % IV SOLN
1000.0000 mg | Freq: Once | INTRAVENOUS | Status: AC
  Administered 2024-01-02: 1000 mg via INTRAVENOUS
  Filled 2024-01-02: qty 20

## 2024-01-02 MED ORDER — SODIUM CHLORIDE 0.9 % IV SOLN
INTRAVENOUS | Status: DC
Start: 2024-01-02 — End: 2024-01-02

## 2024-01-02 MED ORDER — ACETAMINOPHEN 325 MG PO TABS
650.0000 mg | ORAL_TABLET | Freq: Once | ORAL | Status: AC
  Administered 2024-01-02: 650 mg via ORAL
  Filled 2024-01-02: qty 2

## 2024-01-02 MED ORDER — METHYLPREDNISOLONE SODIUM SUCC 125 MG IJ SOLR
125.0000 mg | Freq: Once | INTRAMUSCULAR | Status: AC
  Administered 2024-01-02: 125 mg via INTRAVENOUS
  Filled 2024-01-02: qty 2

## 2024-01-02 MED ORDER — FAMOTIDINE IN NACL 20-0.9 MG/50ML-% IV SOLN
20.0000 mg | Freq: Once | INTRAVENOUS | Status: AC
  Administered 2024-01-02: 20 mg via INTRAVENOUS
  Filled 2024-01-02: qty 50

## 2024-01-02 NOTE — Patient Instructions (Signed)
 Iron Dextran Injection What is this medication? IRON DEXTRAN (EYE ern DEX tran) treats low levels of iron in your body. Iron is a mineral that plays an important role in making red blood cells, which carry oxygen from your lungs to the rest of your body. This medicine may be used for other purposes; ask your health care provider or pharmacist if you have questions. COMMON BRAND NAME(S): Dexferrum, INFeD What should I tell my care team before I take this medication? They need to know if you have any of these conditions: Anemia not caused by low iron levels Heart disease High levels of iron in the blood Kidney disease Liver disease An unusual or allergic reaction to iron, other medications, foods, dyes, or preservatives Pregnant or trying to get pregnant Breastfeeding How should I use this medication? This medication is injected into a vein or a muscle. It is given by your care team in a hospital or clinic setting. Talk to your care team about the use of this medication in children. While it may be prescribed for children as young as 4 months for selected conditions, precautions do apply. Overdosage: If you think you have taken too much of this medicine contact a poison control center or emergency room at once. NOTE: This medicine is only for you. Do not share this medicine with others. What if I miss a dose? Keep appointments for follow-up doses. It is important not to miss your dose. Call your care team if you are unable to keep an appointment. What may interact with this medication? Do not take this medication with any of the following: Deferoxamine Dimercaprol Other iron products This medication may also interact with the following: Chloramphenicol Deferasirox This list may not describe all possible interactions. Give your health care provider a list of all the medicines, herbs, non-prescription drugs, or dietary supplements you use. Also tell them if you smoke, drink alcohol, or use  illegal drugs. Some items may interact with your medicine. What should I watch for while using this medication? Visit your care team for regular checks on your progress. Tell your care team if your symptoms do not start to get better or if they get worse. You may need blood work while taking this medication. You may need to eat more foods that contain iron. Talk to your care team. Foods that contain iron include whole grains/cereals, dried fruits, beans, peas, leafy green vegetables, and organ meats (liver, kidney). Long-term use of this medication may increase your risk of some cancers. Talk to your care team about your risk of cancer. What side effects may I notice from receiving this medication? Side effects that you should report to your care team as soon as possible: Allergic reactions--skin rash, itching, hives, swelling of the face, lips, tongue, or throat Low blood pressure--dizziness, feeling faint or lightheaded, blurry vision Shortness of breath Side effects that usually do not require medical attention (report to your care team if they continue or are bothersome): Flushing Headache Joint pain Muscle pain Nausea Pain, redness, or irritation at injection site This list may not describe all possible side effects. Call your doctor for medical advice about side effects. You may report side effects to FDA at 1-800-FDA-1088. Where should I keep my medication? This medication is given in a hospital or clinic. It will not be stored at home. NOTE: This sheet is a summary. It may not cover all possible information. If you have questions about this medicine, talk to your doctor, pharmacist, or health  care provider.  2024 Elsevier/Gold Standard (2023-01-16 00:00:00)

## 2024-01-02 NOTE — Progress Notes (Signed)
 Patient tolerated iron infusion with no complaints voiced.  Peripheral IV site clean and dry with good blood return noted before and after infusion.  Band aid applied. Pt observed for 30 minutes post iron infusion without any complications.  VSS with discharge and left in satisfactory condition with no s/s of distress noted. All follow ups as scheduled.   Cheryl Abbott

## 2024-01-07 ENCOUNTER — Encounter: Payer: Self-pay | Admitting: Hematology

## 2024-01-08 ENCOUNTER — Other Ambulatory Visit: Payer: Self-pay | Admitting: Orthopedic Surgery

## 2024-01-08 DIAGNOSIS — M25512 Pain in left shoulder: Secondary | ICD-10-CM

## 2024-01-11 ENCOUNTER — Other Ambulatory Visit: Payer: Self-pay | Admitting: Family Medicine

## 2024-01-15 ENCOUNTER — Other Ambulatory Visit: Payer: Self-pay | Admitting: Orthopedic Surgery

## 2024-01-15 DIAGNOSIS — M25512 Pain in left shoulder: Secondary | ICD-10-CM

## 2024-01-18 DIAGNOSIS — R0902 Hypoxemia: Secondary | ICD-10-CM | POA: Diagnosis not present

## 2024-01-20 NOTE — Telephone Encounter (Signed)
 Pt called in to cancel procedure for tomorrow. Did not want to reschedule at this time. Message sent to endo making aware.

## 2024-01-21 ENCOUNTER — Encounter (HOSPITAL_COMMUNITY): Admission: RE | Payer: Self-pay | Source: Home / Self Care

## 2024-01-21 ENCOUNTER — Ambulatory Visit (HOSPITAL_COMMUNITY): Admission: RE | Admit: 2024-01-21 | Source: Home / Self Care | Admitting: Internal Medicine

## 2024-01-21 IMAGING — CT CT ABD-PELV W/ CM
2 of 5 series · 16 of 46 positions shown, 18 images · IV contrast (Omnipaque or Isovue)
Comparison: CT abdomen and pelvis 05/29/2021

CLINICAL DATA: Hydronephrosis

EXAM:
CT ABDOMEN AND PELVIS WITH CONTRAST
TECHNIQUE: Multidetector CT imaging of the abdomen and pelvis was performed
using the standard protocol following bolus administration of
intravenous contrast.

[Series 2: axial st · axial · 0.91mm/px · z∈[+623,+1083]mm · 13 of 106 slices shown, 15 images]
[im 7/106  soft-tissue]
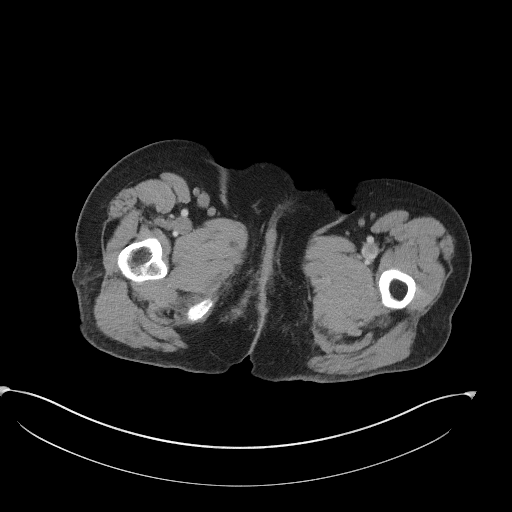
[im 7/106  bone]
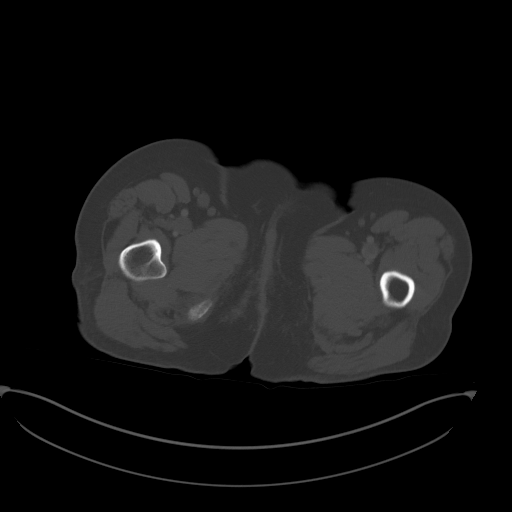
[im 13/106  soft-tissue]
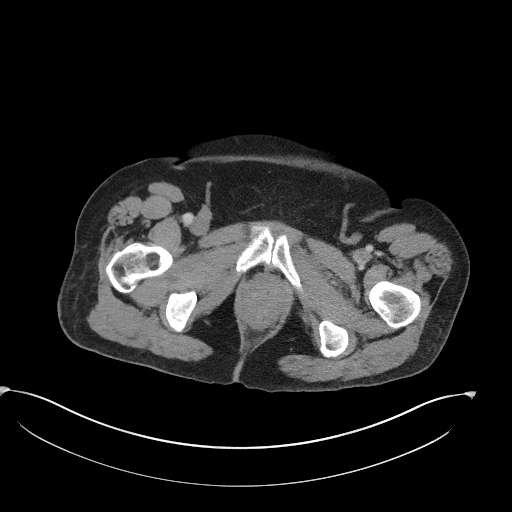
[im 25/106  soft-tissue]
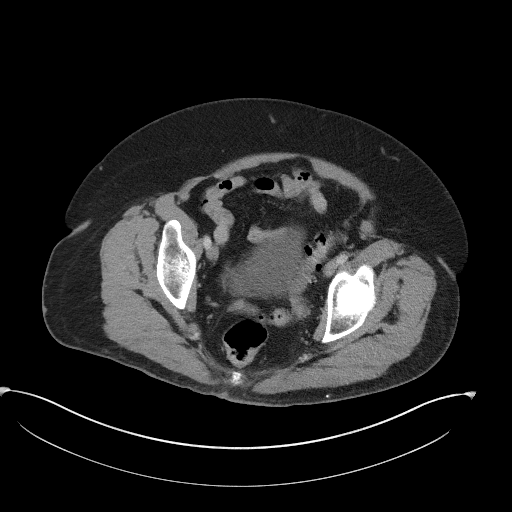
[im 31/106  soft-tissue]
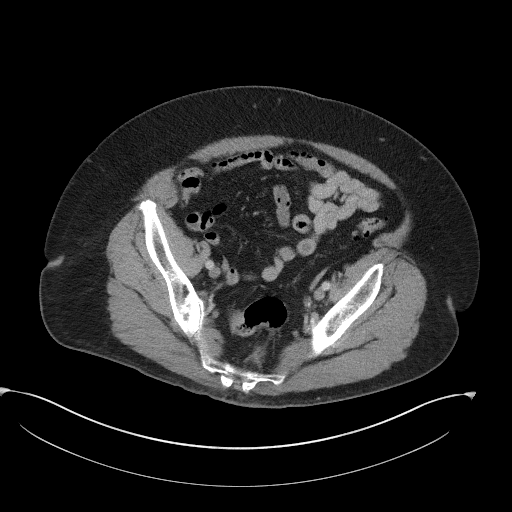
[im 38/106  soft-tissue]
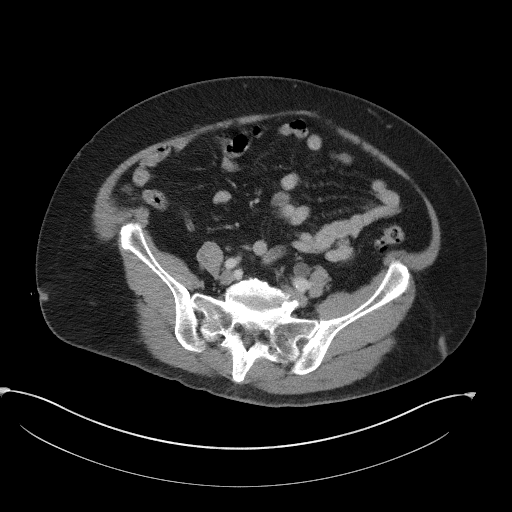
[im 44/106  soft-tissue]
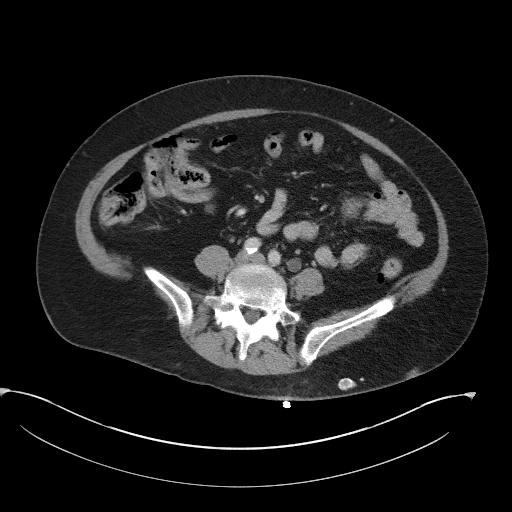
[im 56/106  soft-tissue]
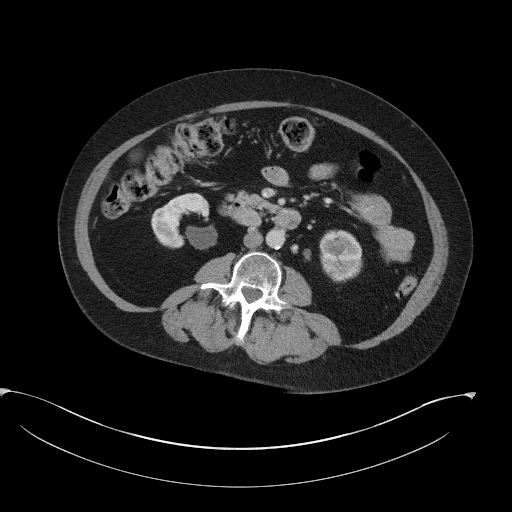
[im 62/106  soft-tissue]
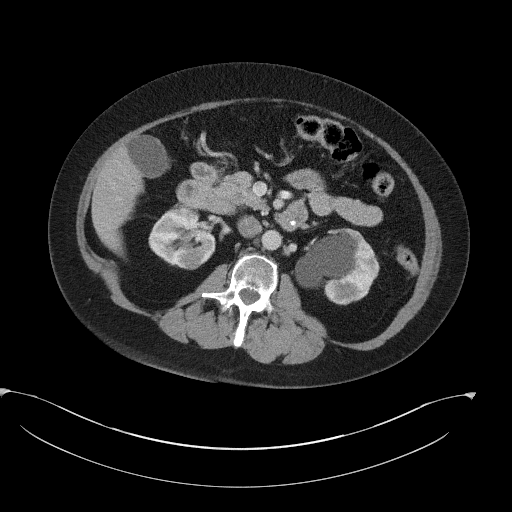
[im 68/106  soft-tissue]
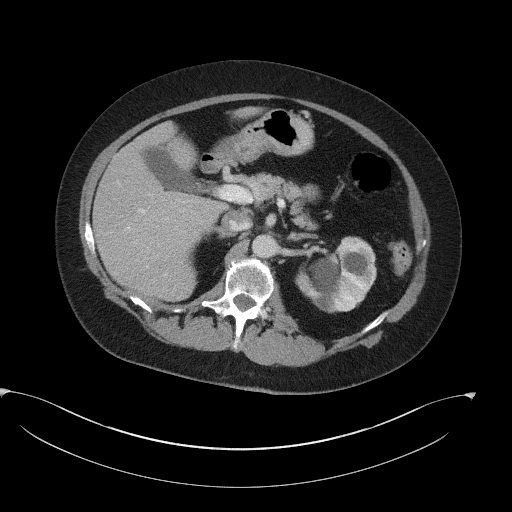
[im 68/106  bone]
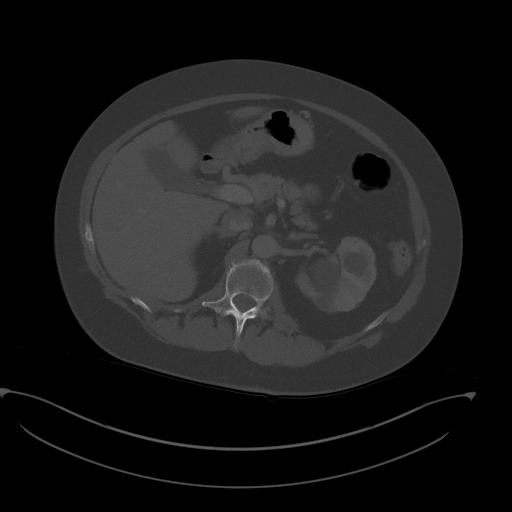
[im 75/106  soft-tissue]
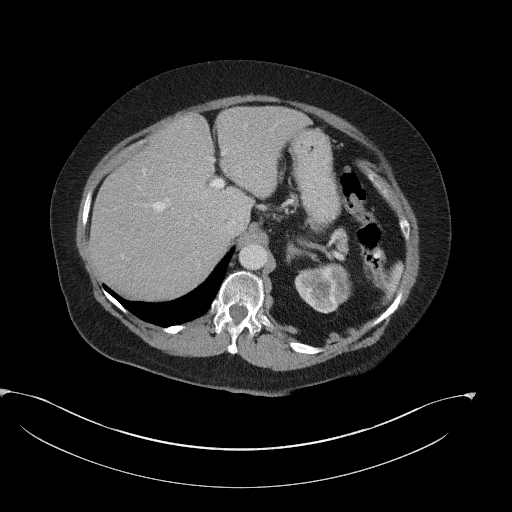
[im 81/106  soft-tissue]
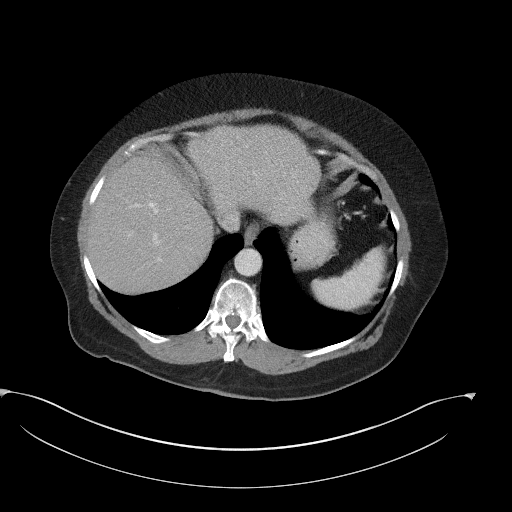
[im 93/106  soft-tissue]
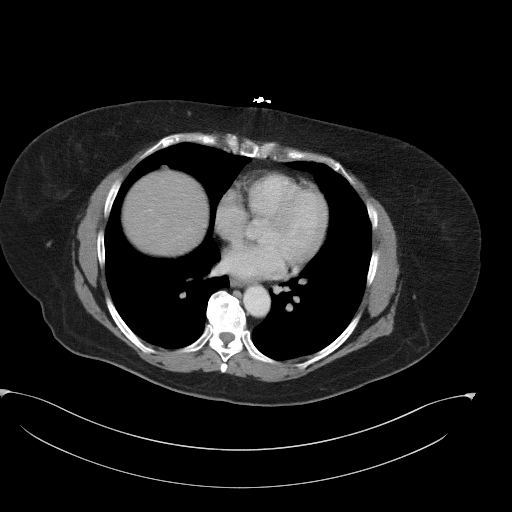
[im 99/106  soft-tissue]
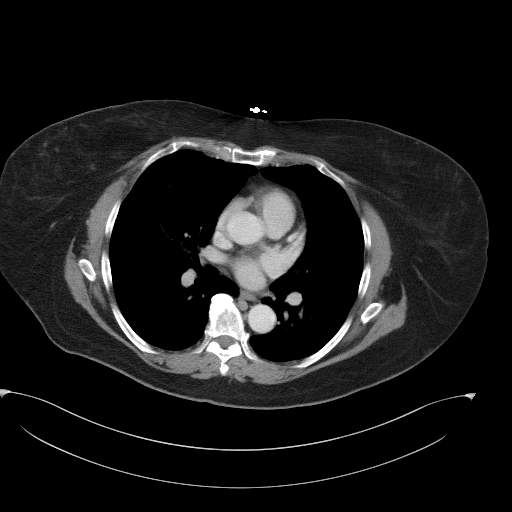

[Series 5: coronal st · coronal · 0.88mm/px · 3 of 118 slices shown]
[im 40/118  soft-tissue]
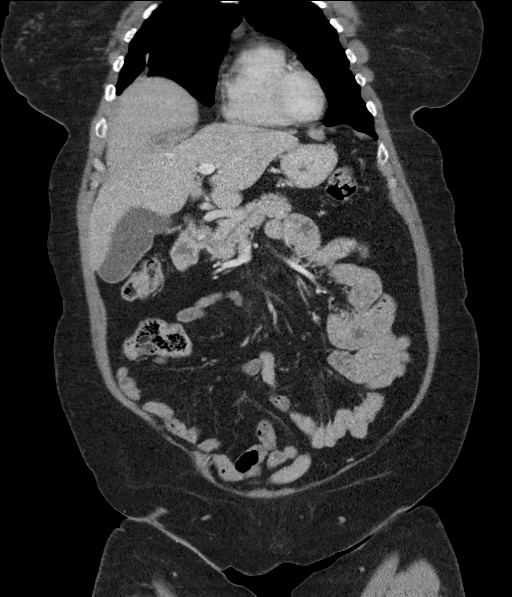
[im 53/118  soft-tissue]
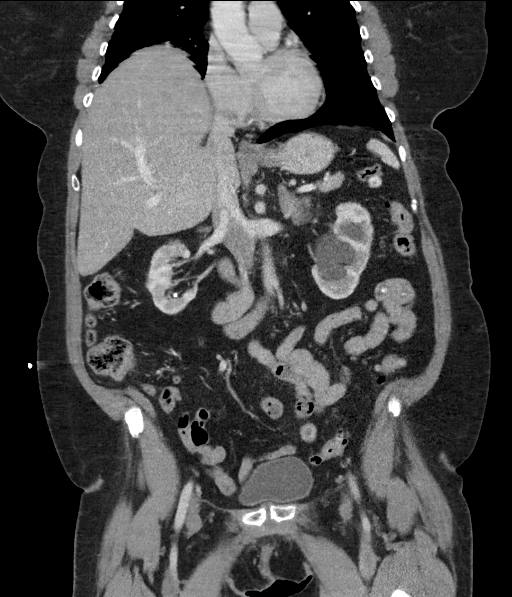
[im 66/118  soft-tissue]
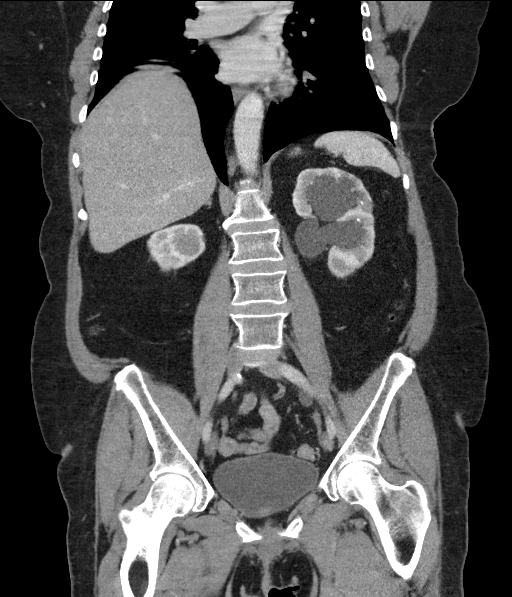

[16 of 46 positions shown; findings below may reference images not displayed]

RADIATION DOSE REDUCTION: This exam was performed according to the
departmental dose-optimization program which includes automated
exposure control, adjustment of the mA and/or kV according to
patient size and/or use of iterative reconstruction technique.

CONTRAST:  75mL OMNIPAQUE IOHEXOL 300 MG/ML  SOLN
FINDINGS: Lower chest: Minor subsegmental atelectatic changes in the lung
bases.

Hepatobiliary: Liver is enlarged measuring 20.5 cm in length. No
suspicious hepatic mass identified. Gallbladder appears grossly
normal. No biliary ductal dilatation identified.

Pancreas: Unremarkable. No pancreatic ductal dilatation or
surrounding inflammatory changes.

Spleen: Normal in size without focal abnormality.

Adrenals/Urinary Tract: Adrenal glands appear normal.
Mild-to-moderate renal atrophy and mild lobulation bilaterally,
right greater than left. 1.9 cm mildly hypodense lesion in the upper
pole right kidney which appeared as a cyst on recent ultrasound. 3
mm calculus in the lower pole right kidney. Several small
nonobstructing calculi in the left kidney measuring up to 3 mm in
the upper pole. There is a persistent chronic 3 mm calculus in the
distal left ureter, similar location as on previous study.
Associated moderate hydroureter and severe hydronephrosis on the
left. Urinary bladder appears normal.

Stomach/Bowel: No bowel obstruction, free air or pneumatosis.
Colonic diverticulosis. No bowel wall edema identified. Appendix is
normal.

Vascular/Lymphatic: Aortic atherosclerosis. No enlarged abdominal or
pelvic lymph nodes.

Reproductive: Status post hysterectomy. No adnexal masses.

Other: No abdominal wall hernia or abnormality. No abdominopelvic
ascites.

Musculoskeletal: No acute or significant osseous findings.
IMPRESSION: 1. No significant change since previous study. Stable to slightly
worsened severe left hydronephrosis and moderate hydroureter
secondary to a chronic 3 mm obstructing calculus in the distal
ureter.
2. Bilateral nephrolithiasis, left-greater-than-right.
3. Bilateral renal atrophy.
4. Mildly complex right renal cyst.
5. Hepatomegaly.
6. Colonic diverticulosis.

## 2024-01-21 SURGERY — COLONOSCOPY
Anesthesia: Choice

## 2024-01-26 ENCOUNTER — Other Ambulatory Visit: Payer: Self-pay | Admitting: Cardiology

## 2024-01-29 ENCOUNTER — Ambulatory Visit

## 2024-01-29 VITALS — BP 124/76 | HR 93 | Resp 12 | Ht 66.0 in | Wt 182.1 lb

## 2024-01-29 DIAGNOSIS — Z0000 Encounter for general adult medical examination without abnormal findings: Secondary | ICD-10-CM | POA: Diagnosis not present

## 2024-01-29 NOTE — Patient Instructions (Signed)
 Cheryl Abbott , Thank you for taking time out of your busy schedule to complete your Annual Wellness Visit with me. I enjoyed our conversation and look forward to speaking with you again next year. I, as well as your care team,  appreciate your ongoing commitment to your health goals. Please review the following plan we discussed and let me know if I can assist you in the future.  Your Game plan/ To Do List   Follow up Visits: We will see or speak with you next year for your Next Medicare AWV with our clinical staff   Clinician Recommendations:  Aim for 30 minutes of exercise or brisk walking, 6-8 glasses of water , and 5 servings of fruits and vegetables each day.    Wishing you many blessings and good health during the next year until our next visit.  -Ghazal Pevey   This is a list of the screenings recommended for you:  Health Maintenance  Topic Date Due   Colon Cancer Screening  09/21/2021   COVID-19 Vaccine (5 - 2024-25 season) 02/10/2023   Flu Shot  01/10/2024   DEXA scan (bone density measurement)  03/30/2024   Yearly kidney health urinalysis for diabetes  04/24/2024   Complete foot exam   04/28/2024   Hemoglobin A1C  06/06/2024   Mammogram  06/27/2024   Yearly kidney function blood test for diabetes  12/05/2024   Eye exam for diabetics  12/11/2024   Medicare Annual Wellness Visit  01/28/2025   DTaP/Tdap/Td vaccine (4 - Td or Tdap) 03/06/2032   Pneumococcal Vaccine for age over 64  Completed   Hepatitis C Screening  Completed   Zoster (Shingles) Vaccine  Completed   HPV Vaccine  Aged Out   Meningitis B Vaccine  Aged Out    Advanced directives: (Provided) Advance directive discussed with you today. I have provided a copy for you to complete at home and have notarized. Once this is complete, please bring a copy in to our office so we can scan it into your chart.  Advance Care Planning is important because it:  [x]  Makes sure you receive the medical care that is consistent with your  values, goals, and preferences  [x]  It provides guidance to your family and loved ones and reduces their decisional burden about whether or not they are making the right decisions based on your wishes.  Follow the link provided in your after visit summary or read over the paperwork we have mailed to you to help you started getting your Advance Directives in place. If you need assistance in completing these, please reach out to us  so that we can help you!  See attachments for Preventive Care and Fall Prevention Tips.

## 2024-01-29 NOTE — Progress Notes (Signed)
 Please attest and cosign this visit due to patients primary care provider not being in the office at the time the visit was completed.  Subjective:   Cheryl Abbott is a 78 y.o. who presents for a Medicare Wellness preventive visit.  As a reminder, Annual Wellness Visits don't include a physical exam, and some assessments may be limited, especially if this visit is performed virtually. We may recommend an in-person follow-up visit with your provider if needed.  Visit Complete: In person  Persons Participating in Visit: Patient.  AWV Questionnaire: No: Patient Medicare AWV questionnaire was not completed prior to this visit.  Cardiac Risk Factors include: advanced age (>13men, >74 women);diabetes mellitus;dyslipidemia;hypertension;sedentary lifestyle     Objective:    Today's Vitals   01/29/24 1436  BP: 124/76  Pulse: 93  Resp: 12  SpO2: 94%  Weight: 182 lb 1.9 oz (82.6 kg)  Height: 5' 6 (1.676 m)   Body mass index is 29.39 kg/m.     01/29/2024    2:24 PM 12/25/2023   11:38 AM 09/18/2023    9:51 AM 09/06/2023    2:17 PM 09/04/2023    1:59 AM 08/26/2023    8:19 AM 10/24/2022    9:27 AM  Advanced Directives  Does Patient Have a Medical Advance Directive? No No No No No No Yes  Type of Advance Directive       Living will  Does patient want to make changes to medical advance directive?       No - Patient declined  Would patient like information on creating a medical advance directive? Yes (MAU/Ambulatory/Procedural Areas - Information given) No - Patient declined No - Patient declined No - Patient declined No - Patient declined No - Patient declined     Current Medications (verified) Outpatient Encounter Medications as of 01/29/2024  Medication Sig   acetaminophen  (TYLENOL ) 500 MG tablet Take 2 tablets (1,000 mg total) by mouth at bedtime.   albuterol  (VENTOLIN  HFA) 108 (90 Base) MCG/ACT inhaler Inhale 2 puffs into the lungs 2 (two) times daily.   amLODipine  (NORVASC ) 10  MG tablet TAKE 1 TABLET BY MOUTH EVERY DAY   anastrozole  (ARIMIDEX ) 1 MG tablet Take 1 tablet (1 mg total) by mouth daily.   aspirin  EC 81 MG tablet Take 81 mg by mouth daily. Swallow whole.   benzonatate  (TESSALON ) 200 MG capsule Take 200 mg by mouth 2 (two) times daily.   Blood Glucose Monitoring Suppl (ONE TOUCH ULTRA 2) w/Device KIT daily.   Blood Glucose Monitoring Suppl (ONE TOUCH ULTRA 2) w/Device KIT FOR ONCE DAILY TESTING. MAY SUBSTITUTE TO ANY MANUFACTURER COVERED BY PATIENT'S INSURANCE. DX E11.65   cholecalciferol (VITAMIN D3) 25 MCG (1000 UNIT) tablet Take 1,000 Units by mouth daily.   diclofenac  Sodium (VOLTAREN ) 1 % GEL APPLY 4 G TOPICALLY 4 (FOUR) TIMES DAILY AS NEEDED (PAIN).   diphenhydramine -acetaminophen  (TYLENOL  PM) 25-500 MG TABS tablet Take 2 tablets by mouth at bedtime.   Dulaglutide  (TRULICITY ) 0.75 MG/0.5ML SOAJ INJECT 0.75 MG INTO THE SKIN WEEKLY   ezetimibe  (ZETIA ) 10 MG tablet TAKE 1 TABLET BY MOUTH EVERY DAY   folic acid  (FOLVITE ) 1 MG tablet Take 1 tablet (1 mg total) by mouth daily.   gabapentin  (NEURONTIN ) 300 MG capsule Take 1 capsule (300 mg total) by mouth 2 (two) times daily.   Glucose Blood (BLOOD GLUCOSE TEST STRIPS) STRP 1 each by In Vitro route daily. May substitute to any manufacturer covered by patient's insurance. DX E11.65  Magnesium  400 MG CAPS Take 1 tablet by mouth daily.   mirabegron  ER (MYRBETRIQ ) 25 MG TB24 tablet Take 1 tablet (25 mg total) by mouth daily.   nitroGLYCERIN  (NITROSTAT ) 0.4 MG SL tablet PLACE 1 TABLET (0.4 MG TOTAL) UNDER THE TONGUE EVERY 5 (FIVE) MINUTES AS NEEDED FOR CHEST PAIN. UP TO 3 TABLETS, THEN CALL 911.   ONETOUCH ULTRA test strip USE AS DIRECTED   pantoprazole  (PROTONIX ) 40 MG tablet TAKE 1 TABLET BY MOUTH TWICE A DAY   PARoxetine  (PAXIL ) 40 MG tablet TAKE 1 TABLET (40 MG TOTAL) BY MOUTH IN THE MORNING   polyethylene glycol-electrolytes (NULYTELY ) 420 g solution Take by mouth.   promethazine -dextromethorphan  (PROMETHAZINE -DM) 6.25-15 MG/5ML syrup SMARTSIG:5 Milliliter(s) By Mouth 1-3 Times Daily PRN   Sennosides 8.6 MG CAPS Take two tablets at bedtime   SYMBICORT  80-4.5 MCG/ACT inhaler INHALE 2 PUFFS INTO THE LUNGS TWICE A DAY   TRADJENTA  5 MG TABS tablet Take 5 mg by mouth daily.   traMADol  (ULTRAM ) 50 MG tablet TAKE 1 TABLET BY MOUTH EVERY 6 HOURS AS NEEDED   UNABLE TO FIND Diabetic shoes x 1 pair, inserts x 3 pair  DX E11.9   vitamin B-12 (VITAMIN B12) 500 MCG tablet Take 1 tablet (500 mcg total) by mouth daily.   Facility-Administered Encounter Medications as of 01/29/2024  Medication   0.9 %  sodium chloride  infusion    Allergies (verified) Ace inhibitors, Cephalexin, Nitrofurantoin , and Penicillins   History: Past Medical History:  Diagnosis Date   Anxiety disorder    Arthritis    Chronic back pain    Chronic pain of both shoulders    per pt told due to cervical spine pinched nerve   CKD (chronic kidney disease), stage III [facility])    nephrologist--- dr rachele;   due to tubular necrosis   Claustrophobia    Depression    Edema of both lower extremities    GERD (gastroesophageal reflux disease)    Headache    Hemorrhoids    w/ intermittant bleeding   History of adenomatous polyp of colon    History of kidney stones    Hx-TIA (transient ischemic attack) 02/26/2016   per pt no residual   Hydronephrosis, left    followed by urologist--- dr matilda;   persistant   Hyperlipidemia    Hypertension    Iron  deficiency    followed by [facility] w/ hx iron  infusions   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   oncologist--- [facility];  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, active survillence   Lactose intolerance in adult 02/01/2016   Left ureteral calculus    Lumbar disc disease with radiculopathy    Nephrolithiasis    per CT 08-11-2021  left > right   Neuropathy, peripheral    Nocturia more than twice per night    Normocytic anemia    OSA (obstructive  sleep apnea)    per study 2007;   per pt no Bipap use since 2021   Renal atrophy, bilateral    Renal cyst, right    Sciatica    Sigmoid diverticulosis    Tick bite of ankle 07/28/2021   Type 2 diabetes mellitus (HCC)    Past Surgical History:  Procedure Laterality Date   BIOPSY N/A 03/17/2013   Procedure: GASTRIC BIOPSIES;  Surgeon: Margo LITTIE Haddock, MD;  Location: [facility];  Service: Endoscopy;  Laterality: N/A;   BIOPSY  06/10/2015   Procedure: BIOPSY;  Surgeon:  Margo LITTIE Haddock, MD;  [facility];  Service: Endoscopy;;  gastric biopsy   BREAST BIOPSY Right 08/20/2023   path pending   BREAST BIOPSY Right 08/20/2023   US  RT BREAST BX W LOC DEV 1ST LESION IMG BX SPEC US  GUIDE 08/20/2023 Lennon Nest, MD [facility]   CARDIAC CATHETERIZATION  05/11/2003   @ [facility] by dr t. burnard;  Abnormal cardiolite /   Normal coronary arteries and normal LVF,  ef  63%   CARDIOVASCULAR STRESS TEST  01/01/2014   normal lexiscan  cardiolite /  no ischemia/ infarct/  normal LV function and wall motion ,  ef 81%   COLONOSCOPY WITH PROPOFOL  N/A 02/26/2017   Procedure: COLONOSCOPY WITH PROPOFOL ;  Surgeon: Haddock Margo LITTIE, MD;  [facility];  Service: Endoscopy;  Laterality: N/A;  11:00am   COLONOSCOPY WITH PROPOFOL  N/A 09/22/2019   Procedure: COLONOSCOPY WITH PROPOFOL ;  Surgeon: Haddock Margo LITTIE, MD;  [facility];  Service: Endoscopy;  Laterality: N/A;  1:15   CYSTO/   RIGHT URETEROSOCOPY LASER LITHOTRIPSY STONE EXTRACTION  10/13/2004   CYSTO/  RIGHT RETROGRADE PYELOGRAM/  PLACMENT RIGHT URETERAL STENT  01/10/2010   CYSTOSCOPY W/ URETERAL STENT PLACEMENT Right 08/19/2015   Procedure: CYSTOSCOPY WITH RETROGRADE PYELOGRAM/URETERAL STENT PLACEMENT;  Surgeon: Garnette Shack, MD;  [facility]: [facility];  Service: Urology;  Laterality: Right;   CYSTOSCOPY W/ URETERAL STENT PLACEMENT Left 02/23/2020   Procedure: CYSTOSCOPY LEFT  RETROGRADE PYELOGRAM LEFT  URETEROSCOPY URETERAL STENT PLACEMENT;  Surgeon: Shack Garnette, MD;  Location: [facility];  Service: Urology;  Laterality: Left;   CYSTOSCOPY W/ URETERAL STENT PLACEMENT Right 09/24/2022   Procedure: CYSTOSCOPY WITH RETROGRADE PYELOGRAM/URETERAL STENT PLACEMENT;  Surgeon: Sherrilee Belvie LITTIE, MD;  Location: [facility];  Service: Urology;  Laterality: Right;   CYSTOSCOPY W/ URETERAL STENT PLACEMENT Right 10/25/2022   Procedure: RIGHT URETERAL STENT EXCHANGE;  Surgeon: Sherrilee Belvie LITTIE, MD;  [facility];  Service: Urology;  Laterality: Right;   CYSTOSCOPY WITH RETROGRADE PYELOGRAM, URETEROSCOPY AND STENT PLACEMENT Left 10/21/2014   Procedure: CYSTOSCOPY WITH RETROGRADE PYELOGRAM, URETEROSCOPY AND STENT PLACEMENT;  Surgeon: Garnette Shack, MD;  Location: [facility];  Service: Urology;  Laterality: Left;   CYSTOSCOPY WITH RETROGRADE PYELOGRAM, URETEROSCOPY AND STENT PLACEMENT Right 11/22/2015   Procedure: CYSTOSCOPY, RIGHT URETERAL STENT REMOVAL; RIGHT RETROGRADE PYELOGRAM, RIGHT URETEROSCOPY WITH BALLOON DILATION; RIGHT URETERAL STENT PLACEMENT;  Surgeon: Garnette Shack, MD;  [facility];  Service: Urology;  Laterality: Right;   CYSTOSCOPY WITH RETROGRADE PYELOGRAM, URETEROSCOPY AND STENT PLACEMENT Left 10/18/2020   Procedure: CYSTOSCOPY WITH LEFT RETROGRADE PYELOGRAM, LEFT URETEROSCOPY  WITH LASER AND LEFT URETERAL STENT PLACEMENT;  Surgeon: Shack Garnette, MD;  [facility];  Service: Urology;  Laterality: Left;   CYSTOSCOPY WITH RETROGRADE PYELOGRAM, URETEROSCOPY AND STENT PLACEMENT Left 10/26/2021   Procedure: CYSTOSCOPY WITH RETROGRADE PYELOGRAM, URETEROSCOPY, STONE EXTRACTION  AND STENT PLACEMENT;  Surgeon: Shack Garnette, MD;  Location: [facility];  Service: Urology;  Laterality: Left;   CYSTOSCOPY WITH RETROGRADE PYELOGRAM, URETEROSCOPY AND STENT PLACEMENT Bilateral 10/25/2022   Procedure: CYSTOSCOPY WITH RETROGRADE PYELOGRAM,  URETEROSCOPY;  Surgeon: Sherrilee Belvie LITTIE, MD;  [facility];  Service: Urology;  Laterality: Bilateral;   CYSTOSCOPY/RETROGRADE/URETEROSCOPY/STONE EXTRACTION WITH BASKET Bilateral 08/02/2015   Procedure: CYSTOSCOPY; BILATERAL RETROGRADE PYELOGRAMS; BILATERAL URETEROSCOPY, STONE EXTRACTION WITH BASKET;  Surgeon: Garnette Shack, MD;  [facility];  Service: Urology;  Laterality: Bilateral;   CYSTOSCOPY/RETROGRADE/URETEROSCOPY/STONE EXTRACTION WITH BASKET Right 06/28/2015   Procedure: CYSTOSCOPY/RETROGRADE/URETEROSCOPY/STONE EXTRACTION  WITH BASKET, RIGHT URETERAL DOUBLE J STENT PLACEMENT;  Surgeon: Garnette Shack, MD;  [facility];  Service: Urology;  Laterality: Right;   ESOPHAGOGASTRODUODENOSCOPY (EGD) WITH PROPOFOL  N/A 03/17/2013   Procedure: ESOPHAGOGASTRODUODENOSCOPY (EGD) WITH PROPOFOL ;  Surgeon: Margo LITTIE Haddock, MD;  [facility];  Service: Endoscopy;  Laterality: N/A;   ESOPHAGOGASTRODUODENOSCOPY (EGD) WITH PROPOFOL  N/A 06/10/2015   Distal gastritis, distal esophageal stricture s/p dilation   ESOPHAGOGASTRODUODENOSCOPY (EGD) WITH PROPOFOL  N/A 02/26/2017   Procedure: ESOPHAGOGASTRODUODENOSCOPY (EGD) WITH PROPOFOL ;  Surgeon: Haddock Margo LITTIE, MD;  [facility];  Service: Endoscopy;  Laterality: N/A;   FLEXIBLE SIGMOIDOSCOPY  09/11/2011   DOQ:[id number] Internal hemorrhoids   HOLMIUM LASER APPLICATION Left 10/21/2014   Procedure: HOLMIUM LASER APPLICATION;  Surgeon: Garnette Shack, MD;  Location: [facility];  Service: Urology;  Laterality: Left;   HOLMIUM LASER APPLICATION Right 06/28/2015   Procedure: HOLMIUM LASER APPLICATION;  Surgeon: Garnette Shack, MD;  [facility];  Service: Urology;  Laterality: Right;   HOLMIUM LASER APPLICATION  02/23/2020   Procedure: HOLMIUM LASER APPLICATION;  Surgeon: Shack Garnette, MD;  [facility];  Service: Urology;;   HOLMIUM LASER APPLICATION Left 10/18/2020   Procedure: HOLMIUM LASER  APPLICATION;  Surgeon: Shack Garnette, MD;  [facility];  Service: Urology;  Laterality: Left;   HOLMIUM LASER APPLICATION Right 10/25/2022   Procedure: HOLMIUM LASER APPLICATION;  Surgeon: Sherrilee Belvie LITTIE, MD;  [facility];  Service: Urology;  Laterality: Right;   MASS EXCISION Left 03/04/2015   Procedure: EXCISION OF SOFT TISSUE NEOPLASM LEFT ARM;  Surgeon: Oneil Budge, MD;  Location[facility];  Service: General;  Laterality: Left;   PERCUTANEOUS NEPHROSTOLITHOTOMY Bilateral 05/2010   @[facility]    POLYPECTOMY N/A 03/17/2013   Procedure: GASTRIC POLYPECTOMY;  Surgeon: Margo LITTIE Haddock, MD;  [facility];  Service: Endoscopy;  Laterality: N/A;   POLYPECTOMY  02/26/2017   Procedure: POLYPECTOMY;  Surgeon: Haddock Margo LITTIE, MD;  [facility];  Service: Endoscopy;;  polyp at cecum, ascending colon polyps x3, hepatic flexure polyps x8, transverse colon polyps x8    POLYPECTOMY  09/22/2019   Procedure: POLYPECTOMY;  Surgeon: Haddock Margo LITTIE, MD;  [facility];  Service: Endoscopy;;   REMOVAL RIGHT THIGH CYST  2006   SAVORY DILATION N/A 03/17/2013   Procedure: SAVORY DILATION;  Surgeon: Margo LITTIE Haddock, MD;  [facility];  Service: Endoscopy;  Laterality: N/A;  #12.8, 14, 15, 16 dilators used   SAVORY DILATION N/A 06/10/2015   Procedure: SAVORY DILATION;  Surgeon: Margo LITTIE Haddock, MD;  [facility];  Service: Endoscopy;  Laterality: N/A;   VAGINAL HYSTERECTOMY  1970's   Family History  Problem Relation Age of Onset   Hypertension Mother    Diabetes Mother    Heart failure Mother    Dementia Mother    Emphysema Father    Hypertension Father    Diabetes Brother    GER disease Brother    Hypertension Sister    Hypertension Sister    Cancer Other        family history    Diabetes Other        family history    Heart defect Other        famiily history    Arthritis Other        family history    Anesthesia problems Neg Hx    Hypotension  Neg Hx    Malignant hyperthermia Neg Hx  Pseudochol deficiency Neg Hx    Colon cancer Neg Hx    Social History   Socioeconomic History   Marital status: Divorced    Spouse name: Not on file   Number of children: 2   Years of education: 12   Highest education level: 12th grade  Occupational History   Occupation: retired   Tobacco Use   Smoking status: Former    Current packs/day: 0.00    Average packs/day: 1 pack/day for 20.0 years (20.0 ttl pk-yrs)    Types: Cigarettes    Start date: 09/21/1974    Quit date: 09/20/1988    Years since quitting: 35.3    Passive exposure: Past   Smokeless tobacco: Never  Vaping Use   Vaping status: Never Used  Substance and Sexual Activity   Alcohol use: No    Alcohol/week: 0.0 standard drinks of alcohol   Drug use: No   Sexual activity: Not Currently    Partners: Male    Birth control/protection: Surgical  Other Topics Concern   Not on file  Social History Narrative   Patient is right handed   Patient drinks some caffeine daily.   Social Drivers of Corporate investment banker Strain: Low Risk  (01/29/2024)   Overall Financial Resource Strain (CARDIA)    Difficulty of Paying Living Expenses: Not hard at all  Food Insecurity: No Food Insecurity (01/29/2024)   Hunger Vital Sign    Worried About Running Out of Food in the Last Year: Never true    Ran Out of Food in the Last Year: Never true  Transportation Needs: No Transportation Needs (01/29/2024)   PRAPARE - Administrator, Civil Service (Medical): No    Lack of Transportation (Non-Medical): No  Physical Activity: Inactive (01/29/2024)   Exercise Vital Sign    Days of Exercise per Week: 0 days    Minutes of Exercise per Session: 0 min  Stress: No Stress Concern Present (01/29/2024)   [employer] of Occupational Health - Occupational Stress Questionnaire    Feeling of Stress: Not at all  Social Connections: Moderately Isolated (01/29/2024)   Social Connection and  Isolation Panel    Frequency of Communication with Friends and Family: More than three times a week    Frequency of Social Gatherings with Friends and Family: More than three times a week    Attends Religious Services: More than 4 times per year    Active Member of [employer] or Organizations: No    Attends Engineer, structural: Never    Marital Status: Divorced    Tobacco Counseling Counseling given: Yes    Clinical Intake:  Pre-visit preparation completed: Yes  Pain : No/denies pain     BMI - recorded: 29.39 Nutritional Status: BMI 25 -29 Overweight Diabetes: Yes CBG done?: No Did pt. bring in CBG monitor from home?: No  Lab Results  Component Value Date   HGBA1C 5.7 (H) 12/06/2023   HGBA1C 5.9 (H) 04/25/2023   HGBA1C 6.3 (H) 11/21/2022     How often do you need to have someone help you when you read instructions, pamphlets, or other written materials from your doctor or pharmacy?: 1 - Never  Interpreter Needed?: No  Information entered by :: Ciji Boston W CMA (AAMA)   Activities of Daily Living     01/29/2024    2:44 PM 09/06/2023    1:57 PM  In your present state of health, do you have any difficulty performing the following activities:  Hearing? 0   Vision? 0   Difficulty concentrating or making decisions? 0   Walking or climbing stairs? 1   Comment uses cane   Dressing or bathing? 0   Doing errands, shopping? 1 0  Comment home health aid assists with this   Preparing Food and eating ? N   Using the Toilet? N   In the past six months, have you accidently leaked urine? N   Do you have problems with loss of bowel control? N   Managing your Medications? N   Managing your Finances? N   Housekeeping or managing your Housekeeping? Y   Comment home health aid assists with house keeping     Patient Care Team: Antonetta Rollene BRAVO, MD as PCP - General (Family Medicine) Alvan, Dorn FALCON, MD as PCP - Cardiology (Cardiology) Harvey Margo CROME, MD (Inactive)  (Gastroenterology) Watt Rush, MD as Attending Physician (Urology) Jenel Carlin POUR, MD (Inactive) (Neurology) Matilda Senior, MD as Consulting Physician (Urology) Roz Anes, MD as Consulting Physician (Ophthalmology) Celestia Joesph SQUIBB, RN as Oncology Nurse Navigator (Medical Oncology)  I have updated your Care Teams any recent Medical Services you may have received from other providers in the past year.     Assessment:   This is a routine wellness examination for Cheryl Abbott.  Hearing/Vision screen Hearing Screening - Comments:: Patient denies any hearing difficulties.   Vision Screening - Comments:: Patient wears reading glasses only. Up to date with yearly exams.  Sees Dr. Anes Kawasaki at [facility] location    Goals Addressed               This Visit's Progress     Weight (lb) < 200 lb (90.7 kg) (pt-stated)   182 lb 1.9 oz (82.6 kg)     I want to lose at least 20-25 pounds       Depression Screen     01/29/2024    2:47 PM 12/25/2023   11:35 AM 12/06/2023    1:03 PM 07/16/2023    2:34 PM 06/19/2023   11:07 AM 04/25/2023    4:08 PM 11/20/2022   10:25 AM  PHQ 2/9 Scores  PHQ - 2 Score 0 0 2 0 0 0 4  PHQ- 9 Score 0 0 3    14    Fall Risk     01/29/2024    2:43 PM 12/06/2023    1:03 PM 07/16/2023    2:34 PM 06/19/2023   11:07 AM 04/25/2023    4:07 PM  Fall Risk   Falls in the past year? 0 0 0 1 1  Number falls in past yr: 0 0 0 0 0  Injury with Fall? 0 0 0 0 0  Risk for fall due to : Impaired balance/gait;Impaired mobility  No Fall Risks Impaired balance/gait Impaired balance/gait  Follow up Falls prevention discussed;Education provided;Falls evaluation completed Falls evaluation completed Falls evaluation completed Falls evaluation completed Falls evaluation completed    MEDICARE RISK AT HOME:  Medicare Risk at Home Any stairs in or around the home?: Yes If so, are there any without handrails?: No Home free of loose throw rugs in walkways, pet  beds, electrical cords, etc?: Yes Adequate lighting in your home to reduce risk of falls?: Yes Life alert?: No Use of a cane, walker or w/c?: Yes Grab bars in the bathroom?: Yes Shower chair or bench in shower?: Yes Elevated toilet seat or a handicapped toilet?: Yes  TIMED UP AND GO:  Was  the test performed?  Yes  Length of time to ambulate 10 feet: 5 sec Gait steady and fast with assistive device  Cognitive Function: 6CIT completed    04/17/2021   10:34 AM  MMSE - Mini Mental State Exam  Not completed: Unable to complete        01/29/2024    2:46 PM 07/06/2022   10:34 AM 04/17/2021   10:35 AM 04/01/2020   10:27 AM 04/01/2019    9:09 AM  6CIT Screen  What Year? 0 points 0 points 0 points 0 points 0 points  What month? 0 points 0 points 0 points 0 points 0 points  What time? 0 points 0 points 3 points 0 points 0 points  Count back from 20 0 points 0 points 0 points 0 points 0 points  Months in reverse 0 points 0 points 0 points 2 points 0 points  Repeat phrase 0 points 2 points 0 points 0 points 0 points  Total Score 0 points 2 points 3 points 2 points 0 points    Immunizations Immunization History  Administered Date(s) Administered   Fluad Quad(high Dose 65+) 03/21/2020, 02/17/2021, 03/06/2022   Fluad Trivalent(High Dose 65+) 04/25/2023   Influenza Split 03/17/2015   Influenza Whole 04/15/2007, 03/17/2009   Influenza, High Dose Seasonal PF 02/02/2019   Influenza,inj,Quad PF,6+ Mos 02/04/2014, 02/12/2017, 02/17/2018   Influenza-Unspecified 02/02/2019   Moderna SARS-COV2 Booster Vaccination 03/06/2022   Moderna Sars-Covid-2 Vaccination 07/22/2019, 08/19/2019, 04/23/2020, 10/13/2020   Pneumococcal Conjugate-13 12/21/2013   Pneumococcal Polysaccharide-23 01/19/2004, 05/23/2010, 02/02/2019   Td 06/07/2003   Tdap 06/14/2011, 03/06/2022   Zoster Recombinant(Shingrix) 04/01/2020, 05/31/2020   Zoster, Live 02/02/2008    Screening Tests Health Maintenance  Topic Date  Due   Colonoscopy  09/21/2021   COVID-19 Vaccine (5 - 2024-25 season) 02/10/2023   INFLUENZA VACCINE  01/10/2024   Diabetic kidney evaluation - Urine ACR  04/24/2024   FOOT EXAM  04/28/2024   HEMOGLOBIN A1C  06/06/2024   Diabetic kidney evaluation - eGFR measurement  12/05/2024   Medicare Annual Wellness (AWV)  12/06/2024   OPHTHALMOLOGY EXAM  12/11/2024   DTaP/Tdap/Td (4 - Td or Tdap) 03/06/2032   Pneumococcal Vaccine: 50+ Years  Completed   DEXA SCAN  Completed   Hepatitis C Screening  Completed   Zoster Vaccines- Shingrix  Completed   HPV VACCINES  Aged Out   Meningococcal B Vaccine  Aged Out    Health Maintenance  Health Maintenance Due  Topic Date Due   Colonoscopy  09/21/2021   COVID-19 Vaccine (5 - 2024-25 season) 02/10/2023   INFLUENZA VACCINE  01/10/2024   Health Maintenance Items Addressed:  Patient was seen by Gastroenterology. She decided against having a follow up colonoscopy. Otherwise she is up to date on routine screenings. Patient advised of recommended vaccines and where to obtain those vaccines with verbal understanding   Additional Screening:  Vision Screening: Recommended annual ophthalmology exams for early detection of glaucoma and other disorders of the eye. Would you like a referral to an eye doctor? No    Dental Screening: Recommended annual dental exams for proper oral hygiene  Community Resource Referral / Chronic Care Management: CRR required this visit?  No   CCM required this visit?  No   Plan:    I have personally reviewed and noted the following in the patient's chart:   Medical and social history Use of alcohol, tobacco or illicit drugs  Current medications and supplements including opioid prescriptions. Patient is currently taking  opioid prescriptions. Information provided to patient regarding non-opioid alternatives. Patient advised to discuss non-opioid treatment plan with their provider. Functional ability and  status Nutritional status Physical activity Advanced directives List of other physicians Hospitalizations, surgeries, and ER visits in previous 12 months Vitals Screenings to include cognitive, depression, and falls Referrals and appointments  In addition, I have reviewed and discussed with patient certain preventive protocols, quality metrics, and best practice recommendations. A written personalized care plan for preventive services as well as general preventive health recommendations were provided to patient.   Elijha Dedman, CMA   01/29/2024   After Visit Summary: (In Person-Printed) AVS printed and given to the patient  Notes: Nothing significant to report at this time.

## 2024-02-02 ENCOUNTER — Other Ambulatory Visit: Payer: Self-pay | Admitting: Family Medicine

## 2024-02-12 ENCOUNTER — Ambulatory Visit (HOSPITAL_COMMUNITY)
Admission: RE | Admit: 2024-02-12 | Discharge: 2024-02-12 | Disposition: A | Discharge status: Home / Self Care | Source: Ambulatory Visit | Attending: Urology | Admitting: Urology

## 2024-02-12 DIAGNOSIS — N132 Hydronephrosis with renal and ureteral calculous obstruction: Secondary | ICD-10-CM | POA: Diagnosis not present

## 2024-02-12 DIAGNOSIS — N200 Calculus of kidney: Secondary | ICD-10-CM | POA: Diagnosis not present

## 2024-02-12 DIAGNOSIS — N281 Cyst of kidney, acquired: Secondary | ICD-10-CM | POA: Diagnosis not present

## 2024-02-13 DIAGNOSIS — N184 Chronic kidney disease, stage 4 (severe): Secondary | ICD-10-CM | POA: Diagnosis not present

## 2024-02-13 DIAGNOSIS — I129 Hypertensive chronic kidney disease with stage 1 through stage 4 chronic kidney disease, or unspecified chronic kidney disease: Secondary | ICD-10-CM | POA: Diagnosis not present

## 2024-02-13 DIAGNOSIS — I5032 Chronic diastolic (congestive) heart failure: Secondary | ICD-10-CM | POA: Diagnosis not present

## 2024-02-13 DIAGNOSIS — E1122 Type 2 diabetes mellitus with diabetic chronic kidney disease: Secondary | ICD-10-CM | POA: Diagnosis not present

## 2024-02-19 ENCOUNTER — Encounter: Payer: Self-pay | Admitting: Urology

## 2024-02-19 ENCOUNTER — Ambulatory Visit: Admitting: Urology

## 2024-02-19 VITALS — BP 99/65 | HR 103

## 2024-02-19 DIAGNOSIS — N200 Calculus of kidney: Secondary | ICD-10-CM | POA: Diagnosis not present

## 2024-02-19 DIAGNOSIS — R3911 Hesitancy of micturition: Secondary | ICD-10-CM

## 2024-02-19 DIAGNOSIS — R351 Nocturia: Secondary | ICD-10-CM

## 2024-02-19 MED ORDER — TAMSULOSIN HCL 0.4 MG PO CAPS: 0.4000 mg | ORAL_CAPSULE | Freq: Every day | ORAL | 11 refills | Status: AC

## 2024-02-19 NOTE — Patient Instructions (Signed)

## 2024-02-19 NOTE — Progress Notes (Signed)
 02/19/2024 2:56 PM   Cheryl Abbott 03-09-1946 [id number]  Referring provider: Antonetta Rollene BRAVO, MD [address] [place],  [place] [address]  Followup nocturia and nephrolithiasis   HPI: Ms Cheryl Abbott is a 78yo here for followup for nephrolithiasis and nocturia. Renal US  shows bilateral small renal calculi. She denies nay flank pain. No stone events since last visit. She notes worsening urinary hesitancy and nocturia 3-4x. She was given rx for mirabegron  last visit but she never filled the prescription.    PMH: Past Medical History:  Diagnosis Date   Anxiety disorder    Arthritis    Chronic back pain    Chronic pain of both shoulders    per pt told due to cervical spine pinched nerve   CKD (chronic kidney disease), stage III [facility])    nephrologist--- dr rachele;   due to tubular necrosis   Claustrophobia    Depression    Edema of both lower extremities    GERD (gastroesophageal reflux disease)    Headache    Hemorrhoids    w/ intermittant bleeding   History of adenomatous polyp of colon    History of kidney stones    Hx-TIA (transient ischemic attack) 02/26/2016   per pt no residual   Hydronephrosis, left    followed by urologist--- dr matilda;   persistant   Hyperlipidemia    Hypertension    Iron  deficiency    followed by [facility] w/ hx iron  infusions   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   oncologist--- [facility];  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, active survillence   Lactose intolerance in adult 02/01/2016   Left ureteral calculus    Lumbar disc disease with radiculopathy    Nephrolithiasis    per CT 08-11-2021  left > right   Neuropathy, peripheral    Nocturia more than twice per night    Normocytic anemia    OSA (obstructive sleep apnea)    per study 2007;   per pt no Bipap use since 2021   Renal atrophy, bilateral    Renal cyst, right    Sciatica    Sigmoid diverticulosis    Tick bite of ankle 07/28/2021    Type 2 diabetes mellitus (HCC)     Surgical History: Past Surgical History:  Procedure Laterality Date   BIOPSY N/A 03/17/2013   Procedure: GASTRIC BIOPSIES;  Surgeon: Cheryl Abbott Haddock, MD;  Location: [facility];  Service: Endoscopy;  Laterality: N/A;   BIOPSY  06/10/2015   Procedure: BIOPSY;  Surgeon: Cheryl Abbott Haddock, MD;  [facility];  Service: Endoscopy;;  gastric biopsy   BREAST BIOPSY Right 08/20/2023   path pending   BREAST BIOPSY Right 08/20/2023   US  RT BREAST BX W LOC DEV 1ST LESION IMG BX SPEC US  GUIDE 08/20/2023 Cheryl Nest, MD [facility]   CARDIAC CATHETERIZATION  05/11/2003   @ [facility] by dr t. burnard;  Abnormal cardiolite /   Normal coronary arteries and normal LVF,  ef  63%   CARDIOVASCULAR STRESS TEST  01/01/2014   normal lexiscan  cardiolite /  no ischemia/ infarct/  normal LV function and wall motion ,  ef 81%   COLONOSCOPY WITH PROPOFOL  N/A 02/26/2017   Procedure: COLONOSCOPY WITH PROPOFOL ;  Surgeon: Abbott Cheryl LITTIE, MD;  [facility];  Service: Endoscopy;  Laterality: N/A;  11:00am   COLONOSCOPY WITH PROPOFOL  N/A 09/22/2019   Procedure: COLONOSCOPY WITH PROPOFOL ;  Surgeon:  Fields, Cheryl CROME, MD;  [facility];  Service: Endoscopy;  Laterality: N/A;  1:15   CYSTO/   RIGHT URETEROSOCOPY LASER LITHOTRIPSY STONE EXTRACTION  10/13/2004   CYSTO/  RIGHT RETROGRADE PYELOGRAM/  PLACMENT RIGHT URETERAL STENT  01/10/2010   CYSTOSCOPY W/ URETERAL STENT PLACEMENT Right 08/19/2015   Procedure: CYSTOSCOPY WITH RETROGRADE PYELOGRAM/URETERAL STENT PLACEMENT;  Surgeon: Cheryl Shack, MD;  Location: [facility];  Service: Urology;  Laterality: Right;   CYSTOSCOPY W/ URETERAL STENT PLACEMENT Left 02/23/2020   Procedure: CYSTOSCOPY LEFT  RETROGRADE PYELOGRAM LEFT URETEROSCOPY URETERAL STENT PLACEMENT;  Surgeon: Abbott Garnette, MD;  Location: [facility];  Service: Urology;  Laterality: Left;   CYSTOSCOPY W/ URETERAL STENT PLACEMENT Right  09/24/2022   Procedure: CYSTOSCOPY WITH RETROGRADE PYELOGRAM/URETERAL STENT PLACEMENT;  Surgeon: Cheryl Belvie CROME, MD;  Location: [facility];  Service: Urology;  Laterality: Right;   CYSTOSCOPY W/ URETERAL STENT PLACEMENT Right 10/25/2022   Procedure: RIGHT URETERAL STENT EXCHANGE;  Surgeon: Cheryl Belvie CROME, MD;  Location: [facility];  Service: Urology;  Laterality: Right;   CYSTOSCOPY WITH RETROGRADE PYELOGRAM, URETEROSCOPY AND STENT PLACEMENT Left 10/21/2014   Procedure: CYSTOSCOPY WITH RETROGRADE PYELOGRAM, URETEROSCOPY AND STENT PLACEMENT;  Surgeon: Cheryl Shack, MD;  Location: [facility];  Service: Urology;  Laterality: Left;   CYSTOSCOPY WITH RETROGRADE PYELOGRAM, URETEROSCOPY AND STENT PLACEMENT Right 11/22/2015   Procedure: CYSTOSCOPY, RIGHT URETERAL STENT REMOVAL; RIGHT RETROGRADE PYELOGRAM, RIGHT URETEROSCOPY WITH BALLOON DILATION; RIGHT URETERAL STENT PLACEMENT;  Surgeon: Cheryl Shack, MD;  [facility]: [facility];  Service: Urology;  Laterality: Right;   CYSTOSCOPY WITH RETROGRADE PYELOGRAM, URETEROSCOPY AND STENT PLACEMENT Left 10/18/2020   Procedure: CYSTOSCOPY WITH LEFT RETROGRADE PYELOGRAM, LEFT URETEROSCOPY  WITH LASER AND LEFT URETERAL STENT PLACEMENT;  Surgeon: Abbott Garnette, MD;  [facility];  Service: Urology;  Laterality: Left;   CYSTOSCOPY WITH RETROGRADE PYELOGRAM, URETEROSCOPY AND STENT PLACEMENT Left 10/26/2021   Procedure: CYSTOSCOPY WITH RETROGRADE PYELOGRAM, URETEROSCOPY, STONE EXTRACTION  AND STENT PLACEMENT;  Surgeon: Abbott Garnette, MD;  Location: [facility];  Service: Urology;  Laterality: Left;   CYSTOSCOPY WITH RETROGRADE PYELOGRAM, URETEROSCOPY AND STENT PLACEMENT Bilateral 10/25/2022   Procedure: CYSTOSCOPY WITH RETROGRADE PYELOGRAM, URETEROSCOPY;  Surgeon: Cheryl Belvie CROME, MD;  Location: [facility];  Service: Urology;  Laterality: Bilateral;   CYSTOSCOPY/RETROGRADE/URETEROSCOPY/STONE EXTRACTION WITH BASKET Bilateral  08/02/2015   Procedure: CYSTOSCOPY; BILATERAL RETROGRADE PYELOGRAMS; BILATERAL URETEROSCOPY, STONE EXTRACTION WITH BASKET;  Surgeon: Cheryl Shack, MD;  Location: [facility];  Service: Urology;  Laterality: Bilateral;   CYSTOSCOPY/RETROGRADE/URETEROSCOPY/STONE EXTRACTION WITH BASKET Right 06/28/2015   Procedure: CYSTOSCOPY/RETROGRADE/URETEROSCOPY/STONE EXTRACTION WITH BASKET, RIGHT URETERAL DOUBLE J STENT PLACEMENT;  Surgeon: Cheryl Shack, MD;  [facility]: [facility];  Service: Urology;  Laterality: Right;   ESOPHAGOGASTRODUODENOSCOPY (EGD) WITH PROPOFOL  N/A 03/17/2013   Procedure: ESOPHAGOGASTRODUODENOSCOPY (EGD) WITH PROPOFOL ;  Surgeon: Cheryl Abbott Haddock, MD;  [facility]: [facility];  Service: Endoscopy;  Laterality: N/A;   ESOPHAGOGASTRODUODENOSCOPY (EGD) WITH PROPOFOL  N/A 06/10/2015   Distal gastritis, distal esophageal stricture s/p dilation   ESOPHAGOGASTRODUODENOSCOPY (EGD) WITH PROPOFOL  N/A 02/26/2017   Procedure: ESOPHAGOGASTRODUODENOSCOPY (EGD) WITH PROPOFOL ;  Surgeon: Abbott Cheryl CROME, MD;  [facility];  Service: Endoscopy;  Laterality: N/A;   FLEXIBLE SIGMOIDOSCOPY  09/11/2011   DOQ:[id number] Internal hemorrhoids   HOLMIUM LASER APPLICATION Left 10/21/2014   Procedure: HOLMIUM LASER APPLICATION;  Surgeon: Cheryl Shack, MD;  Location: [facility];  Service: Urology;  Laterality: Left;   HOLMIUM LASER APPLICATION Right 06/28/2015   Procedure: HOLMIUM LASER APPLICATION;  Surgeon: Cheryl Shack, MD;  [facility];  Service: Urology;  Laterality: Right;   HOLMIUM LASER APPLICATION  02/23/2020   Procedure: HOLMIUM LASER APPLICATION;  Surgeon: Abbott Garnette, MD;  Location[facility];  Service: Urology;;   HOLMIUM LASER APPLICATION Left 10/18/2020   Procedure: HOLMIUM LASER APPLICATION;  Surgeon: Abbott Garnette, MD;  Location: [facility];  Service: Urology;  Laterality: Left;   HOLMIUM LASER APPLICATION Right 10/25/2022   Procedure: HOLMIUM LASER APPLICATION;   Surgeon: Cheryl Belvie CROME, MD;  Location: [facility];  Service: Urology;  Laterality: Right;   MASS EXCISION Left 03/04/2015   Procedure: EXCISION OF SOFT TISSUE NEOPLASM LEFT ARM;  Surgeon: Oneil Budge, MD;  Location: [facility];  Service: General;  Laterality: Left;   PERCUTANEOUS NEPHROSTOLITHOTOMY Bilateral 05/2010   @[facility]    POLYPECTOMY N/A 03/17/2013   Procedure: GASTRIC POLYPECTOMY;  Surgeon: Cheryl Abbott Haddock, MD;  [facility];  Service: Endoscopy;  Laterality: N/A;   POLYPECTOMY  02/26/2017   Procedure: POLYPECTOMY;  Surgeon: Abbott Cheryl CROME, MD;  [facility];  Service: Endoscopy;;  polyp at cecum, ascending colon polyps x3, hepatic flexure polyps x8, transverse colon polyps x8    POLYPECTOMY  09/22/2019   Procedure: POLYPECTOMY;  Surgeon: Abbott Cheryl CROME, MD;  [facility];  Service: Endoscopy;;   REMOVAL RIGHT THIGH CYST  2006   SAVORY DILATION N/A 03/17/2013   Procedure: SAVORY DILATION;  Surgeon: Cheryl Abbott Haddock, MD;  [facility];  Service: Endoscopy;  Laterality: N/A;  #12.8, 14, 15, 16 dilators used   SAVORY DILATION N/A 06/10/2015   Procedure: SAVORY DILATION;  Surgeon: Cheryl Abbott Haddock, MD;  [facility];  Service: Endoscopy;  Laterality: N/A;   VAGINAL HYSTERECTOMY  1970's    Home Medications:  Allergies as of 02/19/2024       Reactions   Ace Inhibitors Cough   Patient doesn't recall Other Reaction(s): GI Intolerance   Cephalexin Diarrhea, Nausea And Vomiting   Other Reaction(s): GI Intolerance   Nitrofurantoin  Diarrhea, Nausea And Vomiting   Penicillins Hives, Other (See Comments)        Medication List        Accurate as of February 19, 2024  2:56 PM. If you have any questions, ask your nurse or doctor.          acetaminophen  500 MG tablet Commonly known as: TYLENOL  Take 2 tablets (1,000 mg total) by mouth at bedtime.   albuterol  108 (90 Base) MCG/ACT inhaler Commonly known as: VENTOLIN  HFA Inhale 2 puffs into  the lungs 2 (two) times daily.   amLODipine  10 MG tablet Commonly known as: NORVASC  TAKE 1 TABLET BY MOUTH EVERY DAY   anastrozole  1 MG tablet Commonly known as: ARIMIDEX  Take 1 tablet (1 mg total) by mouth daily.   aspirin  EC 81 MG tablet Take 81 mg by mouth daily. Swallow whole.   benzonatate  200 MG capsule Commonly known as: TESSALON  Take 200 mg by mouth 2 (two) times daily.   cholecalciferol 25 MCG (1000 UNIT) tablet Commonly known as: VITAMIN D3 Take 1,000 Units by mouth daily.   cyanocobalamin  500 MCG tablet Commonly known as: VITAMIN B12 Take 1 tablet (500 mcg total) by mouth daily.   diclofenac  Sodium 1 % Gel Commonly known as: VOLTAREN  APPLY 4 G TOPICALLY 4 (FOUR) TIMES DAILY AS NEEDED (PAIN).   diphenhydramine -acetaminophen  25-500 MG Tabs tablet Commonly known as: TYLENOL  PM Take 2 tablets by mouth at bedtime.   ezetimibe  10  MG tablet Commonly known as: ZETIA  TAKE 1 TABLET BY MOUTH EVERY DAY   folic acid  1 MG tablet Commonly known as: FOLVITE  Take 1 tablet (1 mg total) by mouth daily.   gabapentin  300 MG capsule Commonly known as: NEURONTIN  Take 1 capsule (300 mg total) by mouth 2 (two) times daily.   Magnesium  400 MG Caps Take 1 tablet by mouth daily.   mirabegron  ER 25 MG Tb24 tablet Commonly known as: MYRBETRIQ  Take 1 tablet (25 mg total) by mouth daily.   nitroGLYCERIN  0.4 MG SL tablet Commonly known as: NITROSTAT  PLACE 1 TABLET (0.4 MG TOTAL) UNDER THE TONGUE EVERY 5 (FIVE) MINUTES AS NEEDED FOR CHEST PAIN. UP TO 3 TABLETS, THEN CALL 911.   ONE TOUCH ULTRA 2 w/Device Kit daily.   ONE TOUCH ULTRA 2 w/Device Kit FOR ONCE DAILY TESTING. MAY SUBSTITUTE TO ANY MANUFACTURER COVERED BY PATIENT'S INSURANCE. DX E11.65   OneTouch Ultra test strip Generic drug: glucose blood USE AS DIRECTED   BLOOD GLUCOSE TEST STRIPS Strp 1 each by In Vitro route daily. May substitute to any manufacturer covered by patient's insurance. DX E11.65   pantoprazole   40 MG tablet Commonly known as: PROTONIX  TAKE 1 TABLET BY MOUTH TWICE A DAY   PARoxetine  40 MG tablet Commonly known as: PAXIL  TAKE 1 TABLET (40 MG TOTAL) BY MOUTH IN THE MORNING   polyethylene glycol-electrolytes 420 g solution Commonly known as: NuLYTELY  Take by mouth.   promethazine -dextromethorphan 6.25-15 MG/5ML syrup Commonly known as: PROMETHAZINE -DM SMARTSIG:5 Milliliter(s) By Mouth 1-3 Times Daily PRN   Sennosides 8.6 MG Caps Take two tablets at bedtime   Symbicort  80-4.5 MCG/ACT inhaler Generic drug: budesonide -formoterol  INHALE 2 PUFFS INTO THE LUNGS TWICE A DAY   Tradjenta  5 MG Tabs tablet Generic drug: linagliptin  Take 5 mg by mouth daily.   traMADol  50 MG tablet Commonly known as: ULTRAM  TAKE 1 TABLET BY MOUTH EVERY 6 HOURS AS NEEDED   Trulicity  0.75 MG/0.5ML Soaj Generic drug: Dulaglutide  INJECT 0.75 MG INTO THE SKIN WEEKLY   UNABLE TO FIND Diabetic shoes x 1 pair, inserts x 3 pair  DX E11.9        Allergies:  Allergies  Allergen Reactions   Ace Inhibitors Cough    Patient doesn't recall  Other Reaction(s): GI Intolerance   Cephalexin Diarrhea and Nausea And Vomiting    Other Reaction(s): GI Intolerance   Nitrofurantoin  Diarrhea and Nausea And Vomiting   Penicillins Hives and Other (See Comments)    Family History: Family History  Problem Relation Age of Onset   Hypertension Mother    Diabetes Mother    Heart failure Mother    Dementia Mother    Emphysema Father    Hypertension Father    Diabetes Brother    GER disease Brother    Hypertension Sister    Hypertension Sister    Cancer Other        family history    Diabetes Other        family history    Heart defect Other        famiily history    Arthritis Other        family history    Anesthesia problems Neg Hx    Hypotension Neg Hx    Malignant hyperthermia Neg Hx    Pseudochol deficiency Neg Hx    Colon cancer Neg Hx     Social History:  reports that she quit  smoking about 35 years ago. Her smoking use included  cigarettes. She started smoking about 49 years ago. She has a 20 pack-year smoking history. She has been exposed to tobacco smoke. She has never used smokeless tobacco. She reports that she does not drink alcohol and does not use drugs.  ROS: All other review of systems were reviewed and are negative except what is noted above in HPI  Physical Exam: BP 99/65   Pulse (!) 103   Constitutional:  Alert and oriented, No acute distress. HEENT: [place] AT, moist mucus membranes.  Trachea midline, no masses. Cardiovascular: No clubbing, cyanosis, or edema. Respiratory: Normal respiratory effort, no increased work of breathing. GI: Abdomen is soft, nontender, nondistended, no abdominal masses GU: No CVA tenderness.  Lymph: No cervical or inguinal lymphadenopathy. Skin: No rashes, bruises or suspicious lesions. Neurologic: Grossly intact, no focal deficits, moving all 4 extremities. Psychiatric: Normal mood and affect.  Laboratory Data: Lab Results  Component Value Date   WBC 9.3 12/18/2023   HGB 11.7 (L) 12/18/2023   HCT 36.1 12/18/2023   MCV 100.6 (H) 12/18/2023   PLT 295 12/18/2023    Lab Results  Component Value Date   CREATININE 1.92 (H) 12/06/2023    No results found for: PSA  No results found for: TESTOSTERONE  Lab Results  Component Value Date   HGBA1C 5.7 (H) 12/06/2023    Urinalysis    Component Value Date/Time   COLORURINE YELLOW 10/17/2022 1658   APPEARANCEUR Clear 11/21/2022 1033   LABSPEC 1.012 10/17/2022 1658   PHURINE 7.0 10/17/2022 1658   GLUCOSEU Negative 11/21/2022 1033   HGBUR LARGE (A) 10/17/2022 1658   HGBUR moderate 02/07/2010 0907   BILIRUBINUR Negative 11/21/2022 1033   KETONESUR NEGATIVE 10/17/2022 1658   PROTEINUR 1+ (A) 11/21/2022 1033   PROTEINUR 100 (A) 10/17/2022 1658   UROBILINOGEN 0.2 10/12/2022 0953   UROBILINOGEN 0.2 03/01/2015 1600   NITRITE Negative 11/21/2022 1033   NITRITE  NEGATIVE 10/17/2022 1658   LEUKOCYTESUR Trace (A) 11/21/2022 1033   LEUKOCYTESUR LARGE (A) 10/17/2022 1658    Lab Results  Component Value Date   LABMICR 37.1 04/25/2023   WBCUA 6-10 (A) 11/21/2022   LABEPIT 0-10 11/21/2022   MUCUS Present 01/11/2022   BACTERIA None seen 11/21/2022    Pertinent Imaging: Renal US  02/12/2024: Images reviewed and discussed with the patient  Results for orders placed during the hospital encounter of 12/13/20  DG Abd 1 View  Narrative CLINICAL DATA:  Kidney stone status post removal. Question migrated stent.  EXAM: ABDOMEN - 1 VIEW  COMPARISON:  X-ray abdomen 09/28/2020,  FINDINGS: Left ureteral stent with proximal pigtail overlying the expected region of the renal shadow and distal pigtail overlying the expected region of the urinary bladder. The bowel gas pattern is normal. No radio-opaque calculi or other significant radiographic abnormality are seen.  IMPRESSION: Left ureteral stent with proximal pigtail overlying the expected region of the left renal shadow and distal pigtail overlying the expected region of the urinary bladder. Markedly limited evaluation of exact positioning due to bowel gas overlying the abdomen and pelvis as well as no post stent placement comparison images. If continued concern, recommend CT pelvis for further evaluation.   Electronically Signed By: Morgane  Naveau M.D. On: 12/13/2020 21:09  No results found for this or any previous visit.  No results found for this or any previous visit.  No results found for this or any previous visit.  Results for orders placed during the hospital encounter of 07/29/23  US  RENAL  Narrative : PROCEDURE:  US  RENAL  HISTORY: Patient is a 78 y/o  F with nephrolithiasis.  COMPARISON: CT renal 06/19/2023, U/S renal 02/04/2023.  TECHNIQUE: Two-dimensional grayscale and color Doppler ultrasound of the kidneys was performed.  FINDINGS: The urinary bladder  demonstrates normal anechoic echogenicity. The bilateral ureteral jets are visualized.  The right kidney measures 10.5 x 6.4 x 4.5 cm. Renal cortical echotexture is increased. There is no hydronephrosis. There are no stones. There is a simple cyst measuring 2.2 cm at the superior pole.  The left kidney measures 11.2 x 5.8 x 4.8 cm. Renal cortical echotexture is increased. There is mild caliectasis. There is a possible stone measuring 0.9 cm at the midpole. There is a simple cyst measuring 0.8 cm at the midpole.  IMPRESSION: 1. Increased echogenicity of the bilateral renal cortices with multiple cysts, consistent with medical renal disease.  2.  Possible left renal calculus and mild caliectasis.  Thank you for allowing us  to assist in the care of this patient.   Electronically Signed By: Lynwood Mains M.D. On: 07/30/2023 23:24  No results found for this or any previous visit.  No results found for this or any previous visit.  Results for orders placed during the hospital encounter of 06/19/23  CT RENAL STONE STUDY  Narrative CLINICAL DATA:  Right-sided abdominal/flank pain. Concern for kidney stone.  EXAM: CT ABDOMEN AND PELVIS WITHOUT CONTRAST  TECHNIQUE: Multidetector CT imaging of the abdomen and pelvis was performed following the standard protocol without IV contrast.  RADIATION DOSE REDUCTION: This exam was performed according to the departmental dose-optimization program which includes automated exposure control, adjustment of the mA and/or kV according to patient size and/or use of iterative reconstruction technique.  COMPARISON:  CT abdomen pelvis dated 10/17/2022.  FINDINGS: Evaluation of this exam is limited in the absence of intravenous contrast.  Lower chest: There is eventration of the right hemidiaphragm with minimal right lung base atelectasis. The visualized left lung base is clear.  No intra-abdominal free air or free fluid.  Hepatobiliary:  The liver is unremarkable. No biliary ductal dilatation. The gallbladder is unremarkable.  Pancreas: Unremarkable. No pancreatic ductal dilatation or surrounding inflammatory changes.  Spleen: Normal in size without focal abnormality.  Adrenals/Urinary Tract: The adrenal glands unremarkable. Multiple small nonobstructing bilateral renal calculi measure up to 3 mm. The there is mild left hydronephrosis. No obstructing stone. There is an extrarenal pelvis on the right with mild pelviectasis. There is no hydronephrosis on the right. The visualized ureters and urinary bladder appear unremarkable.  Stomach/Bowel: There is sigmoid diverticulosis and scattered colonic diverticula. There is no bowel obstruction or active inflammation. The appendix is normal.  Vascular/Lymphatic: Mild aortoiliac atherosclerotic disease. The IVC is unremarkable. No portal venous gas. There is no adenopathy.  Reproductive: Hysterectomy.  No suspicious adnexal masses.  Other: None  Musculoskeletal: Degenerative changes of the spine. No acute osseous pathology.  IMPRESSION: 1. Mild left hydronephrosis. No obstructing stone. 2. Multiple small nonobstructing bilateral renal calculi. 3. Colonic diverticulosis. No bowel obstruction. Normal appendix. 4.  Aortic Atherosclerosis (ICD10-I70.0).   Electronically Signed By: Vanetta Chou M.D. On: 06/19/2023 15:46   Assessment & Plan:    1. Calculus of kidney (Primary) -followup 6 months with renal US   2. Urinary hesitancy -flomax  0.4mg  qhs   No follow-ups on file.  Belvie Clara, MD  [facility] [place]

## 2024-02-27 ENCOUNTER — Telehealth: Payer: Self-pay | Admitting: Family Medicine

## 2024-02-27 ENCOUNTER — Other Ambulatory Visit: Payer: Self-pay | Admitting: Internal Medicine

## 2024-02-27 DIAGNOSIS — F419 Anxiety disorder, unspecified: Secondary | ICD-10-CM

## 2024-02-27 MED ORDER — DIAZEPAM 5 MG PO TABS: 5.0000 mg | ORAL_TABLET | Freq: Two times a day (BID) | ORAL | 0 refills | Status: AC | PRN

## 2024-02-27 NOTE — Telephone Encounter (Signed)
 Patient came by the office asked if provider can send in 2 or 3 valium  pills in she has breast cancer going through 2 painful test asking if she can get 2 or 3  valium  pills to relax her during these procedure. Scheduled October.   Pharmacy: [facility] [place]

## 2024-02-28 NOTE — Telephone Encounter (Signed)
Pt informed

## 2024-03-10 ENCOUNTER — Telehealth: Payer: Self-pay | Admitting: *Deleted

## 2024-03-10 NOTE — Telephone Encounter (Signed)
 Patient came to office requesting letter from provider to have her knocked out for future tests.   Upon much discussion and chart review, noted the following: Patient see in clinic by Dr. 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 Radiofrequency tag placement was scheduled for 09/10/2023. Partial mastectomy was scheduled for 09/11/2023. Patient was unable to tolerate pain in breast for radiofrequency tag placement on 09/10/2023. Mastectomy was cancelled per provider request.  Patient was seen in clinic by Dr. Mavis on 09/12/2023. Patient elected hormonal therapy instead of surgery at that time.  Dr. Mavis recommended anxiolytic prior to future  any invasive procedures.  Noted that patient is scheduled for follow up mammogram and breast US  on 03/31/2024. Patient is adamant that imaging will be invasive and she will need to be knocked out.   Noted that patient received Rx on 02/27/2024 for Valium  5mg  from PCP for upcoming tests that were scheduled in October for her breast cancer.   Educated that anesthesia is not typically used with imaging. Advised that topical or local anesthesia is used with biopsy sites or tag placement.   Educated that diagnostic mammogram and breast/ axilla US  is not invasive.   Patient continues to have much anxiety regarding upcoming testing.

## 2024-03-12 ENCOUNTER — Other Ambulatory Visit: Payer: Self-pay | Admitting: Orthopedic Surgery

## 2024-03-12 DIAGNOSIS — M25512 Pain in left shoulder: Secondary | ICD-10-CM

## 2024-03-19 DIAGNOSIS — R0902 Hypoxemia: Secondary | ICD-10-CM | POA: Diagnosis not present

## 2024-03-25 ENCOUNTER — Other Ambulatory Visit (HOSPITAL_COMMUNITY): Payer: Self-pay | Admitting: Oncology

## 2024-03-25 DIAGNOSIS — D508 Other iron deficiency anemias: Secondary | ICD-10-CM

## 2024-03-25 DIAGNOSIS — Z853 Personal history of malignant neoplasm of breast: Secondary | ICD-10-CM

## 2024-03-25 DIAGNOSIS — C50411 Malignant neoplasm of upper-outer quadrant of right female breast: Secondary | ICD-10-CM

## 2024-03-25 DIAGNOSIS — D472 Monoclonal gammopathy: Secondary | ICD-10-CM

## 2024-03-31 ENCOUNTER — Inpatient Hospital Stay: Attending: Hematology

## 2024-03-31 ENCOUNTER — Ambulatory Visit (HOSPITAL_COMMUNITY)
Admission: RE | Admit: 2024-03-31 | Discharge: 2024-03-31 | Disposition: A | Discharge status: Home / Self Care | Source: Ambulatory Visit | Attending: Hematology | Admitting: Hematology

## 2024-03-31 ENCOUNTER — Encounter (HOSPITAL_COMMUNITY): Payer: Self-pay

## 2024-03-31 DIAGNOSIS — Z170 Estrogen receptor positive status [ER+]: Secondary | ICD-10-CM | POA: Insufficient documentation

## 2024-03-31 DIAGNOSIS — Z79811 Long term (current) use of aromatase inhibitors: Secondary | ICD-10-CM | POA: Diagnosis not present

## 2024-03-31 DIAGNOSIS — K5900 Constipation, unspecified: Secondary | ICD-10-CM | POA: Insufficient documentation

## 2024-03-31 DIAGNOSIS — R232 Flushing: Secondary | ICD-10-CM | POA: Insufficient documentation

## 2024-03-31 DIAGNOSIS — Z1732 Human epidermal growth factor receptor 2 negative status: Secondary | ICD-10-CM | POA: Diagnosis not present

## 2024-03-31 DIAGNOSIS — Z1721 Progesterone receptor positive status: Secondary | ICD-10-CM | POA: Insufficient documentation

## 2024-03-31 DIAGNOSIS — C50411 Malignant neoplasm of upper-outer quadrant of right female breast: Secondary | ICD-10-CM | POA: Diagnosis present

## 2024-03-31 DIAGNOSIS — D472 Monoclonal gammopathy: Secondary | ICD-10-CM | POA: Insufficient documentation

## 2024-03-31 DIAGNOSIS — Z7982 Long term (current) use of aspirin: Secondary | ICD-10-CM | POA: Diagnosis not present

## 2024-03-31 DIAGNOSIS — R5383 Other fatigue: Secondary | ICD-10-CM | POA: Diagnosis not present

## 2024-03-31 DIAGNOSIS — R92321 Mammographic fibroglandular density, right breast: Secondary | ICD-10-CM | POA: Diagnosis not present

## 2024-03-31 DIAGNOSIS — Z7951 Long term (current) use of inhaled steroids: Secondary | ICD-10-CM | POA: Insufficient documentation

## 2024-03-31 DIAGNOSIS — E611 Iron deficiency: Secondary | ICD-10-CM | POA: Diagnosis not present

## 2024-03-31 DIAGNOSIS — N6315 Unspecified lump in the right breast, overlapping quadrants: Secondary | ICD-10-CM | POA: Diagnosis not present

## 2024-03-31 DIAGNOSIS — Z7984 Long term (current) use of oral hypoglycemic drugs: Secondary | ICD-10-CM | POA: Diagnosis not present

## 2024-03-31 DIAGNOSIS — D508 Other iron deficiency anemias: Secondary | ICD-10-CM

## 2024-03-31 DIAGNOSIS — Z853 Personal history of malignant neoplasm of breast: Secondary | ICD-10-CM | POA: Insufficient documentation

## 2024-03-31 DIAGNOSIS — Z79899 Other long term (current) drug therapy: Secondary | ICD-10-CM | POA: Diagnosis not present

## 2024-03-31 LAB — CBC WITH DIFFERENTIAL/PLATELET
Abs Immature Granulocytes: 0.02 K/uL (ref 0.00–0.07)
Basophils Absolute: 0 K/uL (ref 0.0–0.1)
Basophils Relative: 1 %
Eosinophils Absolute: 0.2 K/uL (ref 0.0–0.5)
Eosinophils Relative: 3 %
HCT: 38.3 % (ref 36.0–46.0)
Hemoglobin: 12.1 g/dL (ref 12.0–15.0)
Immature Granulocytes: 0 %
Lymphocytes Relative: 15 %
Lymphs Abs: 1 K/uL (ref 0.7–4.0)
MCH: 32 pg (ref 26.0–34.0)
MCHC: 31.6 g/dL (ref 30.0–36.0)
MCV: 101.3 fL — ABNORMAL HIGH (ref 80.0–100.0)
Monocytes Absolute: 0.7 K/uL (ref 0.1–1.0)
Monocytes Relative: 10 %
Neutro Abs: 5 K/uL (ref 1.7–7.7)
Neutrophils Relative %: 71 %
Platelets: 331 K/uL (ref 150–400)
RBC: 3.78 MIL/uL — ABNORMAL LOW (ref 3.87–5.11)
RDW: 15.2 % (ref 11.5–15.5)
WBC: 7.1 K/uL (ref 4.0–10.5)
nRBC: 0 % (ref 0.0–0.2)

## 2024-03-31 LAB — COMPREHENSIVE METABOLIC PANEL WITH GFR
ALT: 9 U/L (ref 0–44)
AST: 18 U/L (ref 15–41)
Albumin: 4.6 g/dL (ref 3.5–5.0)
Alkaline Phosphatase: 87 U/L (ref 38–126)
Anion gap: 11 (ref 5–15)
BUN: 16 mg/dL (ref 8–23)
CO2: 25 mmol/L (ref 22–32)
Calcium: 9.9 mg/dL (ref 8.9–10.3)
Chloride: 100 mmol/L (ref 98–111)
Creatinine, Ser: 1.92 mg/dL — ABNORMAL HIGH (ref 0.44–1.00)
GFR, Estimated: 26 mL/min — ABNORMAL LOW (ref >60)
Glucose, Bld: 109 mg/dL — ABNORMAL HIGH (ref 70–99)
Potassium: 3.7 mmol/L (ref 3.5–5.1)
Sodium: 136 mmol/L (ref 135–145)
Total Bilirubin: 0.6 mg/dL (ref 0.0–1.2)
Total Protein: 8.6 g/dL — ABNORMAL HIGH (ref 6.5–8.1)

## 2024-03-31 LAB — IRON AND TIBC
Iron: 59 ug/dL (ref 28–170)
Saturation Ratios: 14 % (ref 10.4–31.8)
TIBC: 424 ug/dL (ref 250–450)
UIBC: 365 ug/dL

## 2024-03-31 LAB — FERRITIN: Ferritin: 21 ng/mL (ref 11–307)

## 2024-04-01 ENCOUNTER — Ambulatory Visit (HOSPITAL_COMMUNITY)
Admission: RE | Admit: 2024-04-01 | Discharge: 2024-04-01 | Disposition: A | Discharge status: Home / Self Care | Source: Ambulatory Visit | Attending: Hematology | Admitting: Hematology

## 2024-04-01 DIAGNOSIS — M8588 Other specified disorders of bone density and structure, other site: Secondary | ICD-10-CM | POA: Diagnosis not present

## 2024-04-01 DIAGNOSIS — Z780 Asymptomatic menopausal state: Secondary | ICD-10-CM | POA: Diagnosis not present

## 2024-04-01 DIAGNOSIS — C50411 Malignant neoplasm of upper-outer quadrant of right female breast: Secondary | ICD-10-CM | POA: Insufficient documentation

## 2024-04-01 DIAGNOSIS — Z170 Estrogen receptor positive status [ER+]: Secondary | ICD-10-CM | POA: Insufficient documentation

## 2024-04-01 LAB — KAPPA/LAMBDA LIGHT CHAINS
Kappa free light chain: 36.4 mg/L — ABNORMAL HIGH (ref 3.3–19.4)
Kappa, lambda light chain ratio: 2.22 — ABNORMAL HIGH (ref 0.26–1.65)
Lambda free light chains: 16.4 mg/L (ref 5.7–26.3)

## 2024-04-02 LAB — PROTEIN ELECTROPHORESIS, SERUM
A/G Ratio: 0.9 (ref 0.7–1.7)
Albumin ELP: 3.6 g/dL (ref 2.9–4.4)
Alpha-1-Globulin: 0.2 g/dL (ref 0.0–0.4)
Alpha-2-Globulin: 0.9 g/dL (ref 0.4–1.0)
Beta Globulin: 1 g/dL (ref 0.7–1.3)
Gamma Globulin: 1.9 g/dL — ABNORMAL HIGH (ref 0.4–1.8)
Globulin, Total: 3.9 g/dL (ref 2.2–3.9)
M-Spike, %: 1.5 g/dL — ABNORMAL HIGH
Total Protein ELP: 7.5 g/dL (ref 6.0–8.5)

## 2024-04-07 ENCOUNTER — Inpatient Hospital Stay: Admitting: Oncology

## 2024-04-07 VITALS — BP 128/76 | HR 98 | Temp 99.1°F | Resp 19 | Ht 66.5 in | Wt 177.0 lb

## 2024-04-07 DIAGNOSIS — Z170 Estrogen receptor positive status [ER+]: Secondary | ICD-10-CM | POA: Diagnosis not present

## 2024-04-07 DIAGNOSIS — C50411 Malignant neoplasm of upper-outer quadrant of right female breast: Secondary | ICD-10-CM | POA: Diagnosis not present

## 2024-04-07 DIAGNOSIS — D509 Iron deficiency anemia, unspecified: Secondary | ICD-10-CM

## 2024-04-07 DIAGNOSIS — D472 Monoclonal gammopathy: Secondary | ICD-10-CM | POA: Diagnosis not present

## 2024-04-07 NOTE — Progress Notes (Signed)
 Patient Care Team: Antonetta Rollene BRAVO, MD as PCP - General (Family Medicine) Alvan, Dorn FALCON, MD as PCP - Cardiology (Cardiology) Harvey Margo CROME, MD (Inactive) (Gastroenterology) Watt Rush, MD as Attending Physician (Urology) Jenel Carlin POUR, MD (Inactive) (Neurology) Matilda Senior, MD as Consulting Physician (Urology) Roz Anes, MD as Consulting Physician (Ophthalmology) Celestia Joesph SQUIBB, RN as Oncology Nurse Navigator (Medical Oncology)  Clinic Day:  04/07/2024  Referring physician: Antonetta Rollene BRAVO, MD   CHIEF COMPLAINT:  CC: Stage I (T1a N0 M0 G2 ER/PR+, HER2-) right breast UOQ invasive lobular carcinoma and IgG kappa smoldering myeloma   Cheryl Abbott 78 y.o. female was transferred to my care after her prior physician has left.   ASSESSMENT & PLAN:   Assessment & Plan: Cheryl Abbott  is a 78 y.o. female with right breast invasive lobular carcinoma and IgG kappa smoldering myeloma. Assessment & Plan Smoldering myeloma     The patient understands the plans discussed today and is in agreement with them.  She knows to contact our office if she develops concerns prior to her next appointment.  *** minutes of total time was spent for this patient encounter, including preparation,review of records,  face-to-face counseling with the patient and coordination of care, physical exam, and documentation of the encounter.    Cheryl Abbott,acting as a neurosurgeon for Mickiel Dry, MD.,have documented all relevant documentation on the behalf of Mickiel Dry, MD,as directed by  Mickiel Dry, MD while in the presence of Mickiel Dry, MD.  ***  Mickiel Dry, MD  [facility] [place] - A DEPT OF [facility] [address] [place]  [place] [address] Dept: [phone] Dept Fax: [phone]   Orders Placed This Encounter  Procedures   CBC with Differential    Standing Status:   Future     Expected Date:   07/06/2024    Expiration Date:   10/04/2024   Comprehensive metabolic panel    Standing Status:   Future    Expected Date:   07/06/2024    Expiration Date:   10/04/2024   Kappa/lambda light chains    Standing Status:   Future    Expected Date:   07/06/2024    Expiration Date:   10/04/2024   Protein electrophoresis, serum    Standing Status:   Future    Expected Date:   07/06/2024    Expiration Date:   10/04/2024     ONCOLOGY HISTORY:   I have reviewed her chart and materials related to her cancer extensively and collaborated history with the patient. Summary of oncologic history is as follows:   Diagnosis: Stage I (T1a N0 M0 G2 ER/PR+, HER2-) right breast UOQ invasive lobular carcinoma   -06/28/2023: Screening Mammogram: Further evaluation is suggested for possible asymmetry in the right breast. -08/06/2023: Diagnostic Right Breast Mammogram and US : Indeterminate 0.3 cm mass in the right breast at the 9 o'clock position. -08/20/2023: Right breast mass biopsy.  Pathology: Invasive lobular carcinoma in situ, solid and cribriform, intermediate grade. Tumor spans 0.3 cm. No lymphovascular invasion and no calcification present. Tumor cells are ER positive (60%), PR positive (70%), and Her2 negative (0). Ki-67 <1%. -Patient evaluated by radiology and general surgery [Dr. Jenkins], but was unable to tolerate radiofrequency tag placement and mammography, thus did not proceed with surgery.  -09/18/2023-current: Anastrozole   -03/31/2024: Diagnostic Right breast Mammogram and US : Biopsy-proven RIGHT breast malignancy has not significantly changed in size measuring 0.4 x  0.3 x 0.3 cm on the current exam. No new suspicious abnormality of the RIGHT breast.   Diagnosis: IgG kappa smoldering myeloma   -12/10/2016: UPEP: M-spike of 8.4 mg/24 hours -12/10/2016: SPEP: M-spike 1.5.  -02/15/2017: MM Panel: M-spike 1.5. IgG 2,230. KFLC elevated at 44.6 with ratio of 2.48. Immunofixation shows  IgG monoclonal protein with kappa light chain specificity. Beta-2  Microglobulin 3.6. Serum EPO 27.2. Normal reticulocytes.  -02/18/2017: Creatinine 1.45 (at baseline) -02/19/2017: Bone Marrow Biopsy.  Pathology: Trilineage hematopoiesis with non-specific changes, but there is increased number of plasma cells representing 14% of all cells with occasional atypical forms. Immunohistochemical stains were performed and highlight the plasma cell component in relatively limited bone marrow areas which appear to show kappa light chain excess. In the presence of a monoclonal protein with kappa light specificity, the limited findings favor plasma cell neoplasm. Chromosome Analysis: Normal female karyotype.  FISH Panel: Normal -02/01/2020: Skeletal Survey: No focal lytic or blastic bone lesions are identified.  -02/16/2021: Skeletal Survey: Two adjacent 4 mm lucencies within the right scapula at the base of the glenoid, which were not seen on prior exam. -02/21/2021: Scapula x-ray: Small lucent lesions in the scapula at the base of the glenoid are faintly visible but better seen on the skeletal survey. -2023- current: Relatively stable disease per myeloma labs and skeletal surveys -04/02/2022: Bone Density scan with T-score of -2.0, which is considered osteopenic.  -08/26/2023: Skeletal Survey: Faint small rounded lucencies within the right humeral neck, not seen on prior study, which could reflect sequela of myeloma. Stable nonspecific small lucencies within the right scapula at the base of the glenoid, equivocal for myeloma.   Current Treatment:  ***  INTERVAL HISTORY:   Cheryl Abbott is here today for follow up and to establish care with me for ***. Patient is accompanied by *** .     I have reviewed the past medical history, past surgical history, social history and family history with the patient and they are unchanged from previous note.  ALLERGIES:  is allergic to ace inhibitors, cephalexin,  nitrofurantoin , and penicillins.  MEDICATIONS:  Current Outpatient Medications  Medication Sig Dispense Refill   traMADol  (ULTRAM ) 50 MG tablet TAKE 1 TABLET BY MOUTH EVERY 6 HOURS AS NEEDED 60 tablet 0   acetaminophen  (TYLENOL ) 500 MG tablet Take 2 tablets (1,000 mg total) by mouth at bedtime.     albuterol  (VENTOLIN  HFA) 108 (90 Base) MCG/ACT inhaler Inhale 2 puffs into the lungs 2 (two) times daily.     amLODipine  (NORVASC ) 10 MG tablet TAKE 1 TABLET BY MOUTH EVERY DAY 90 tablet 1   anastrozole  (ARIMIDEX ) 1 MG tablet Take 1 tablet (1 mg total) by mouth daily. 90 tablet 3   aspirin  EC 81 MG tablet Take 81 mg by mouth daily. Swallow whole.     benzonatate  (TESSALON ) 200 MG capsule Take 200 mg by mouth 2 (two) times daily.     Blood Glucose Monitoring Suppl (ONE TOUCH ULTRA 2) w/Device KIT daily.     Blood Glucose Monitoring Suppl (ONE TOUCH ULTRA 2) w/Device KIT FOR ONCE DAILY TESTING. MAY SUBSTITUTE TO ANY MANUFACTURER COVERED BY PATIENT'S INSURANCE. DX E11.65 1 kit 0   cholecalciferol (VITAMIN D3) 25 MCG (1000 UNIT) tablet Take 1,000 Units by mouth daily.     diazepam  (VALIUM ) 5 MG tablet Take 1 tablet (5 mg total) by mouth every 12 (twelve) hours as needed for anxiety. 1 day prior to procedure. 2 tablet 0   diclofenac  Sodium (  VOLTAREN ) 1 % GEL APPLY 4 G TOPICALLY 4 (FOUR) TIMES DAILY AS NEEDED (PAIN). 400 g 5   diphenhydramine -acetaminophen  (TYLENOL  PM) 25-500 MG TABS tablet Take 2 tablets by mouth at bedtime.     Dulaglutide  (TRULICITY ) 0.75 MG/0.5ML SOAJ INJECT 0.75 MG INTO THE SKIN WEEKLY 2 mL 1   ezetimibe  (ZETIA ) 10 MG tablet TAKE 1 TABLET BY MOUTH EVERY DAY 90 tablet 1   folic acid  (FOLVITE ) 1 MG tablet Take 1 tablet (1 mg total) by mouth daily.     gabapentin  (NEURONTIN ) 300 MG capsule Take 1 capsule (300 mg total) by mouth 2 (two) times daily. 90 capsule 5   Glucose Blood (BLOOD GLUCOSE TEST STRIPS) STRP 1 each by In Vitro route daily. May substitute to any manufacturer covered by  patient's insurance. DX E11.65 100 strip 0   Magnesium  400 MG CAPS Take 1 tablet by mouth daily. 30 capsule 11   mirabegron  ER (MYRBETRIQ ) 25 MG TB24 tablet Take 1 tablet (25 mg total) by mouth daily.     nitroGLYCERIN  (NITROSTAT ) 0.4 MG SL tablet PLACE 1 TABLET (0.4 MG TOTAL) UNDER THE TONGUE EVERY 5 (FIVE) MINUTES AS NEEDED FOR CHEST PAIN. UP TO 3 TABLETS, THEN CALL 911. 25 tablet 3   ONETOUCH ULTRA test strip USE AS DIRECTED 100 strip 5   pantoprazole  (PROTONIX ) 40 MG tablet TAKE 1 TABLET BY MOUTH TWICE A DAY 180 tablet 1   PARoxetine  (PAXIL ) 40 MG tablet TAKE 1 TABLET (40 MG TOTAL) BY MOUTH IN THE MORNING 90 tablet 1   polyethylene glycol-electrolytes (NULYTELY ) 420 g solution Take by mouth.     promethazine -dextromethorphan (PROMETHAZINE -DM) 6.25-15 MG/5ML syrup SMARTSIG:5 Milliliter(s) By Mouth 1-3 Times Daily PRN     Sennosides 8.6 MG CAPS Take two tablets at bedtime 60 capsule 3   SYMBICORT  80-4.5 MCG/ACT inhaler INHALE 2 PUFFS INTO THE LUNGS TWICE A DAY 30.6 each 4   tamsulosin  (FLOMAX ) 0.4 MG CAPS capsule Take 1 capsule (0.4 mg total) by mouth daily after supper. 30 capsule 11   TRADJENTA  5 MG TABS tablet Take 5 mg by mouth daily.     UNABLE TO FIND Diabetic shoes x 1 pair, inserts x 3 pair  DX E11.9 1 each 0   vitamin B-12 (VITAMIN B12) 500 MCG tablet Take 1 tablet (500 mcg total) by mouth daily.     No current facility-administered medications for this visit.   Facility-Administered Medications Ordered in Other Visits  Medication Dose Route Frequency Provider Last Rate Last Admin   0.9 %  sodium chloride  infusion   Intravenous Continuous Rogers Hai, MD   Stopped at 09/19/23 1343    REVIEW OF SYSTEMS:   Constitutional: Denies fevers, chills or abnormal weight loss Eyes: Denies blurriness of vision Ears, nose, mouth, throat, and face: Denies mucositis or sore throat Respiratory: Denies cough, dyspnea or wheezes Cardiovascular: Denies palpitation, chest discomfort or  lower extremity swelling Gastrointestinal:  Denies nausea, heartburn or change in bowel habits Skin: Denies abnormal skin rashes Lymphatics: Denies new lymphadenopathy or easy bruising Neurological:Denies numbness, tingling or new weaknesses Behavioral/Psych: Mood is stable, no new changes  All other systems were reviewed with the patient and are negative.   VITALS:  Blood pressure 128/76, pulse 98, temperature 99.1 F (37.3 C), temperature source Tympanic, resp. rate 19, height 5' 6.5 (1.689 m), weight 177 lb (80.3 kg), SpO2 95%.  Wt Readings from Last 3 Encounters:  04/07/24 177 lb (80.3 kg)  01/29/24 182 lb 1.9 oz (82.6 kg)  12/25/23 183 lb 6.8 oz (83.2 kg)    Body mass index is 28.14 kg/m.  Performance status (ECOG): {CHL ONC [id number]  PHYSICAL EXAM:   GENERAL:alert, no distress and comfortable SKIN: skin color, texture, turgor are normal, no rashes or significant lesions EYES: normal, Conjunctiva are pink and non-injected, sclera clear OROPHARYNX:no exudate, no erythema and lips, buccal mucosa, and tongue normal  NECK: supple, thyroid  normal size, non-tender, without nodularity LYMPH:  no palpable lymphadenopathy in the cervical, axillary or inguinal LUNGS: clear to auscultation and percussion with normal breathing effort HEART: regular rate & rhythm and no murmurs and no lower extremity edema ABDOMEN:abdomen soft, non-tender and normal bowel sounds Musculoskeletal:no cyanosis of digits and no clubbing  NEURO: alert & oriented x 3 with fluent speech, no focal motor/sensory deficits  LABORATORY DATA:  I have reviewed the data as listed  No results found for: SPEP, UPEP  Lab Results  Component Value Date   WBC 7.1 03/31/2024   NEUTROABS 5.0 03/31/2024   HGB 12.1 03/31/2024   HCT 38.3 03/31/2024   MCV 101.3 (H) 03/31/2024   PLT 331 03/31/2024    @LASTCHEMISTRY @   RADIOGRAPHIC STUDIES: I have personally reviewed the radiological images as listed and  agreed with the findings in the report. DG Bone Density EXAM: DUAL X-RAY ABSORPTIOMETRY (DXA) FOR BONE MINERAL DENSITY 04/01/2024 10:07 am  CLINICAL DATA:  78 year old Female Postmenopausal. AI therapy evaluation  TECHNIQUE: An axial (e.g., hips, spine) and/or appendicular (e.g., radius) exam was performed, as appropriate, using [employer] [facility]. Images are obtained for bone mineral density measurement and are not obtained for diagnostic purposes. [id number]  Exclusions: L3-L4  COMPARISON:  03/30/2022  FINDINGS: Scan quality: Good.  LUMBAR SPINE (L1-L2):  BMD (in g/cm2): 0.981  T-score: -1.5  Z-score: -0.4  Rate of change from previous exam: No significant rate of change from previous exam.  LEFT FEMORAL NECK:  BMD (in g/cm2): 0.770  T-score: -1.9  Z-score: -0.7  LEFT TOTAL HIP:  BMD (in g/cm2): 0.826  T-score: -1.4  Z-score: -0.4  RIGHT FEMORAL NECK:  BMD (in g/cm2): 0.757  T-score: -2.0  Z-score: -0.8  RIGHT TOTAL HIP:  BMD (in g/cm2): 0.837  T-score: -1.4  Z-score: -0.3  DUAL-FEMUR TOTAL MEAN:  Rate of change from previous exam: No significant rate of change from previous exam.  FRAX 10-YEAR PROBABILITY OF FRACTURE:  10-year fracture risk is performed using the [facility] FRAX calculator based on patient-reported risk factors.  Major osteoporotic fracture: 10.5% Hip fracture: 2.6%  Other situations known to alter the reliability of the FRAX score should be considered when making treatment decisions, including chronic glucocorticoid use and past treatments. Further guidance on treatment can be found at the [facility] Osteoporosis Foundation's website [email or web address]  IMPRESSION: Osteopenia based on BMD.  Fracture risk is increased. Increased risk is based on low BMD.  RECOMMENDATIONS: 1. All patients should optimize calcium  and vitamin D  intake.  2. Consider FDA-approved  medical therapies in postmenopausal women and men aged 86 years and older, based on the following:  - A hip or vertebral (clinical or morphometric) fracture  - T-score less than or equal to -2.5 and secondary causes have been excluded.  - Low bone mass (T-score between -1.0 and -2.5) and a 10-year probability of a hip fracture greater than or equal to 3% or a 10-year probability of a major osteoporosis-related fracture greater than or equal to 20% based on the [place] -adapted WHO algorithm.  -  Clinician judgment and/or patient preferences may indicate treatment for people with 10-year fracture probabilities above or below these levels  3. Patients with diagnosis of osteoporosis or at high risk for fracture should have regular bone mineral density tests. For patients eligible for Medicare, routine testing is allowed once every 2 years. The testing frequency can be increased to one year for patients who have rapidly progressing disease, those who are receiving or discontinuing medical therapy to restore bone mass, or have additional risk factors.  Electronically Signed   By: Dina  Arceo M.D.   On: 04/01/2024 12:43

## 2024-04-07 NOTE — Progress Notes (Signed)
 Cheryl Abbott                                          MRN: [id number]   04/07/2024   The VBCI Quality Team Specialist reviewed this patient medical record for the purposes of chart review for care gap closure. The following were reviewed: chart review for care gap closure-kidney health evaluation for diabetes:eGFR  and uACR.    VBCI Quality Team

## 2024-04-07 NOTE — Patient Instructions (Addendum)
 [facility] at [facility] Discharge Instructions   You were seen and examined today by Dr. Davonna.  She reviewed the results of your lab work which are normal/stable.   We will see you back in 3 months. We will repeat lab work prior to this visit.   Return as scheduled.    Thank you for choosing [facility] at [facility] to provide your oncology and hematology care.  To afford each patient quality time with our provider, please arrive at least 15 minutes before your scheduled appointment time.   If you have a lab appointment with the Cancer Center please come in thru the Main Entrance and check in at the main information desk.  You need to re-schedule your appointment should you arrive 10 or more minutes late.  We strive to give you quality time with our providers, and arriving late affects you and other patients whose appointments are after yours.  Also, if you no show three or more times for appointments you may be dismissed from the clinic at the providers discretion.     Again, thank you for choosing [facility].  Our hope is that these requests will decrease the amount of time that you wait before being seen by our physicians.       _____________________________________________________________  Should you have questions after your visit to [facility], please contact our office at [phone] and follow the prompts.  Our office hours are 8:00 a.m. and 4:30 p.m. Monday - Friday.  Please note that voicemails left after 4:00 p.m. may not be returned until the following business day.  We are closed weekends and major holidays.  You do have access to a nurse 24-7, just call the main number to the clinic [phone] and do not press any options, hold on the line and a nurse will answer the phone.    For prescription refill requests, have your pharmacy contact our office and allow 72 hours.    Due to Covid, you will  need to wear a mask upon entering the hospital. If you do not have a mask, a mask will be given to you at the Main Entrance upon arrival. For doctor visits, patients may have 1 support person age 53 or older with them. For treatment visits, patients can not have anyone with them due to social distancing guidelines and our immunocompromised population.

## 2024-04-15 ENCOUNTER — Encounter: Payer: Self-pay | Admitting: Oncology

## 2024-04-15 ENCOUNTER — Other Ambulatory Visit (HOSPITAL_COMMUNITY): Payer: Self-pay | Admitting: Family Medicine

## 2024-04-15 ENCOUNTER — Ambulatory Visit: Admitting: Family Medicine

## 2024-04-15 ENCOUNTER — Encounter: Payer: Self-pay | Admitting: Family Medicine

## 2024-04-15 VITALS — BP 122/76 | HR 99 | Resp 16 | Ht 66.5 in | Wt 178.0 lb

## 2024-04-15 DIAGNOSIS — F322 Major depressive disorder, single episode, severe without psychotic features: Secondary | ICD-10-CM

## 2024-04-15 DIAGNOSIS — R32 Unspecified urinary incontinence: Secondary | ICD-10-CM

## 2024-04-15 DIAGNOSIS — Z853 Personal history of malignant neoplasm of breast: Secondary | ICD-10-CM

## 2024-04-15 DIAGNOSIS — E1121 Type 2 diabetes mellitus with diabetic nephropathy: Secondary | ICD-10-CM

## 2024-04-15 DIAGNOSIS — I10 Essential (primary) hypertension: Secondary | ICD-10-CM

## 2024-04-15 DIAGNOSIS — E1169 Type 2 diabetes mellitus with other specified complication: Secondary | ICD-10-CM | POA: Diagnosis not present

## 2024-04-15 DIAGNOSIS — E785 Hyperlipidemia, unspecified: Secondary | ICD-10-CM

## 2024-04-15 DIAGNOSIS — Z23 Encounter for immunization: Secondary | ICD-10-CM | POA: Diagnosis not present

## 2024-04-15 NOTE — Patient Instructions (Addendum)
 F/u in 5 months  Nurse visit 11/17 to review with pt how to use her new glucometer and test strips  Pals get covid vaccine in next 1 to 2 weeks  It is important that you exercise regularly at least 30 minutes 5 times a week. If you develop chest pain, have severe difficulty breathing, or feel very tired, stop exercising immediately and seek medical attention   Think about what you will eat, plan ahead. Choose  clean, green, fresh or frozen over canned, processed or packaged foods which are more sugary, salty and fatty. 70 to 75% of food eaten should be vegetables and fruit. Three meals at set times with snacks allowed between meals, but they must be fruit or vegetables. Aim to eat over a 12 hour period , example 7 am to 7 pm, and STOP after  your last meal of the day. Drink water ,generally about 64 ounces per day, no other drink is as healthy. Fruit juice is best enjoyed in a healthy way, by EATING the fruit.  SABRA  Pls schedule left mammogram screeening jan 2026 at checkout  Good foot exam, no infection or retained glass  Microalb and screening bloodwork ordered in July on Nov 17 or shortly after, pls print for pt   Thanks for choosing [facility], we consider it a privelige to serve you.

## 2024-04-27 ENCOUNTER — Ambulatory Visit

## 2024-04-28 ENCOUNTER — Other Ambulatory Visit: Payer: Self-pay | Admitting: Orthopedic Surgery

## 2024-04-28 DIAGNOSIS — M25512 Pain in left shoulder: Secondary | ICD-10-CM

## 2024-04-30 IMAGING — CT CT RENAL STONE PROTOCOL
2 of 4 series · 16 of 46 positions shown, 18 images · non-contrast
Comparison: 08/11/2021

CLINICAL DATA: Flank pain, renal calculus suspected, recent left
ureteral stenting



[Series 2: axial st · axial · 0.82mm/px · z∈[+718,+1178]mm · 13 of 108 slices shown, 15 images]
[im 8/108  soft-tissue]
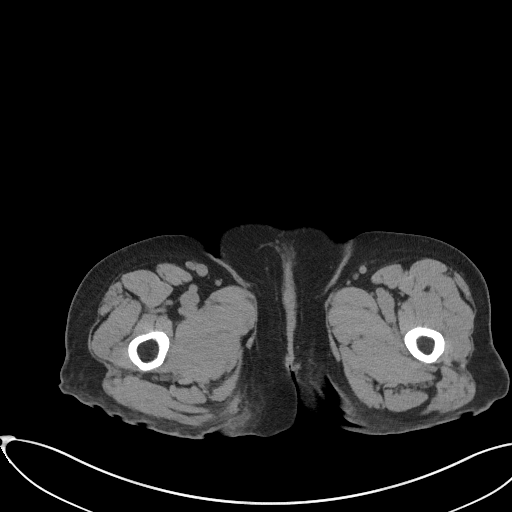
[im 8/108  bone]
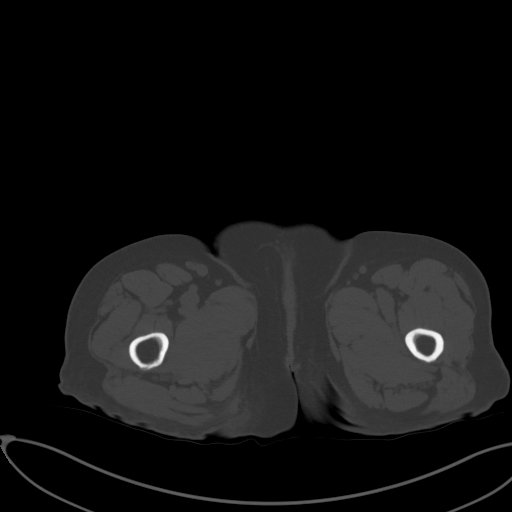
[im 16/108  soft-tissue]
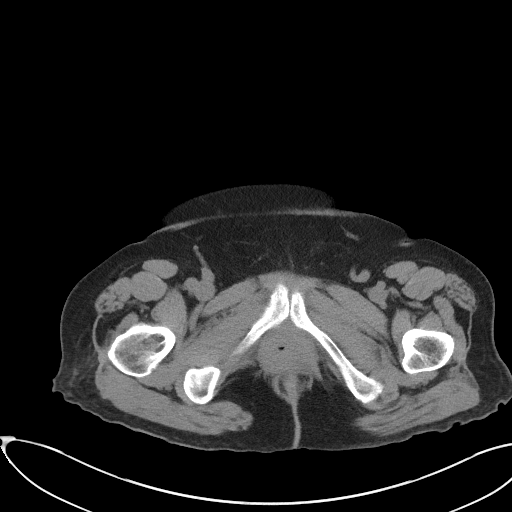
[im 23/108  soft-tissue]
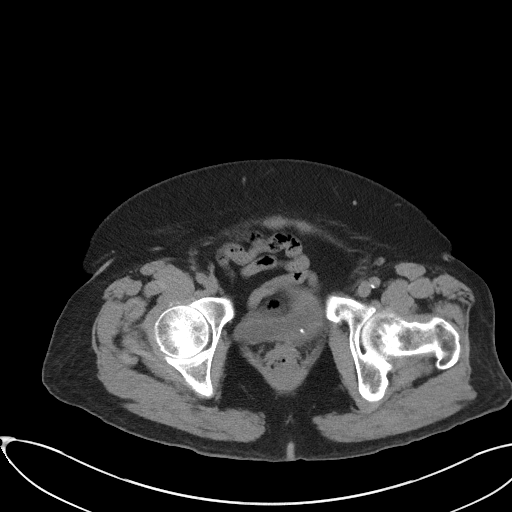
[im 31/108  soft-tissue]
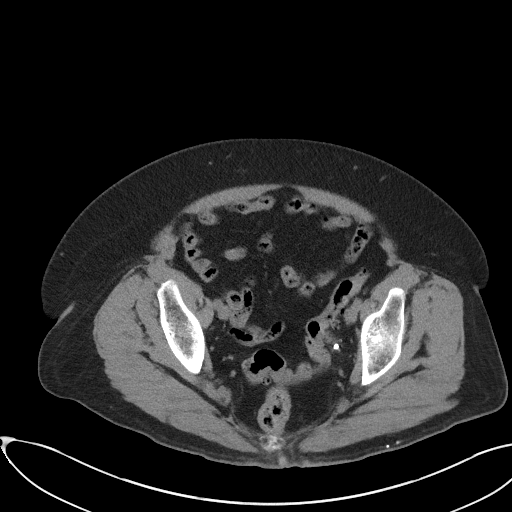
[im 39/108  soft-tissue]
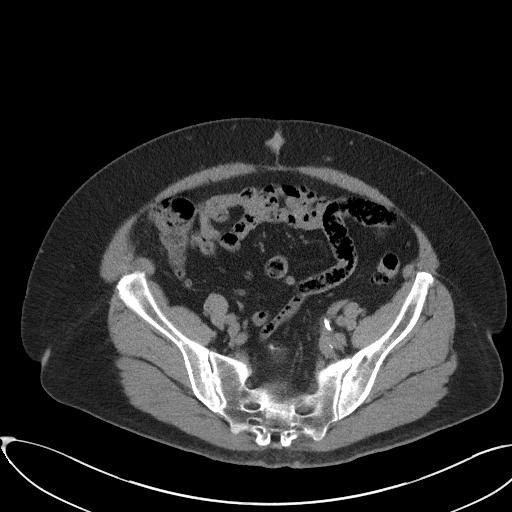
[im 46/108  soft-tissue]
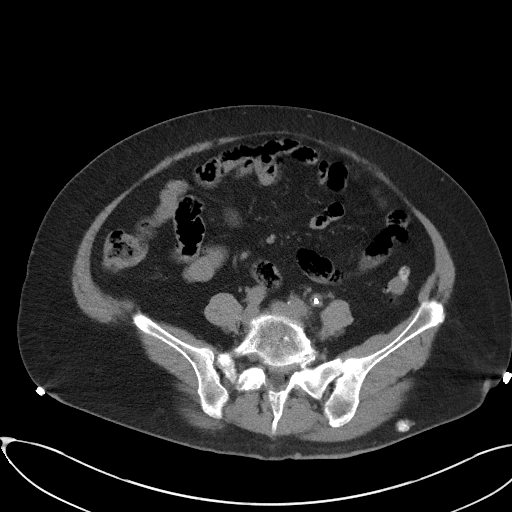
[im 54/108  soft-tissue]
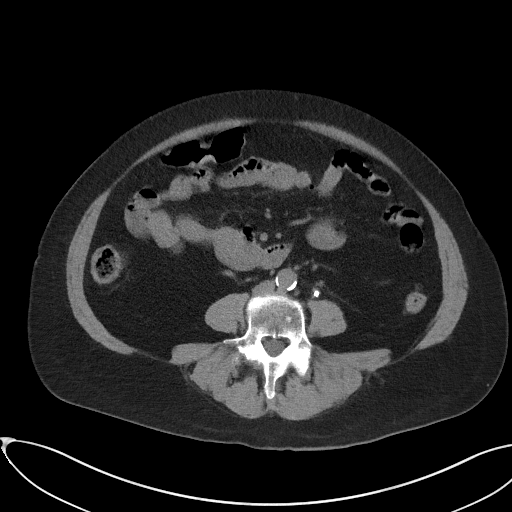
[im 62/108  soft-tissue]
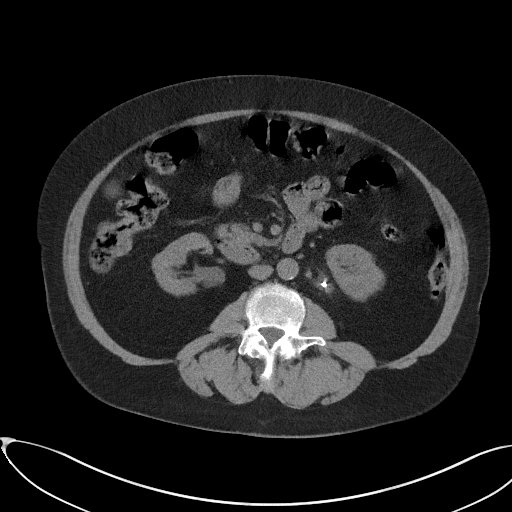
[im 69/108  soft-tissue]
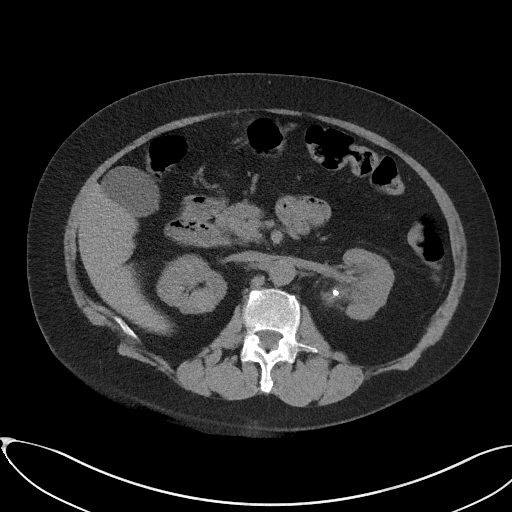
[im 69/108  bone]
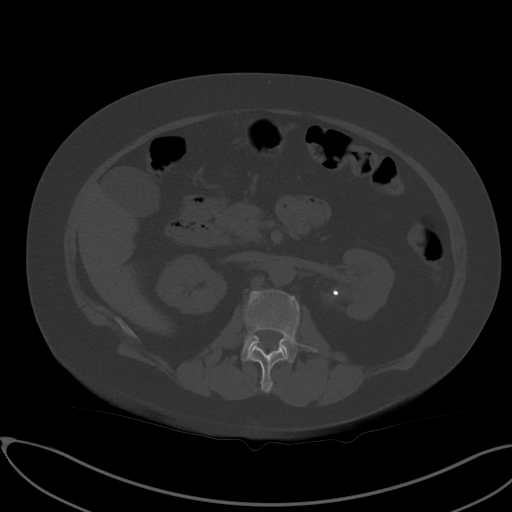
[im 77/108  soft-tissue]
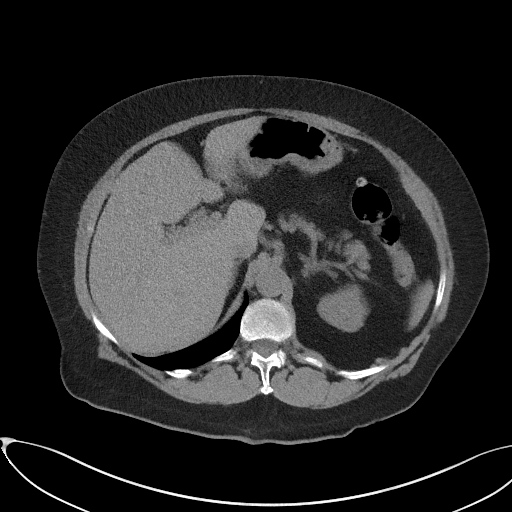
[im 85/108  soft-tissue]
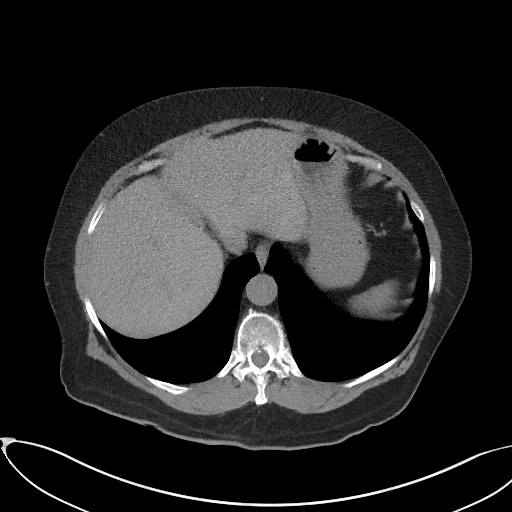
[im 92/108  soft-tissue]
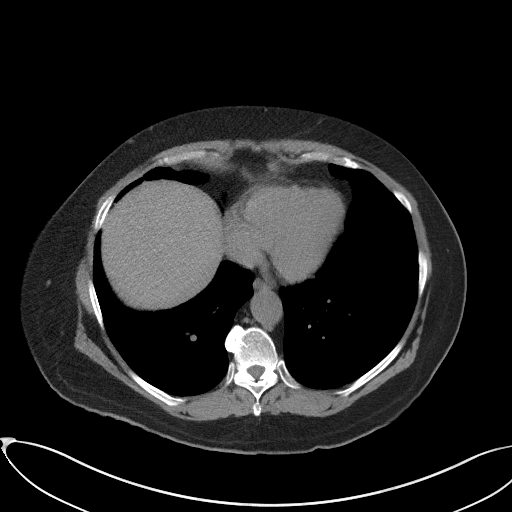
[im 100/108  soft-tissue]
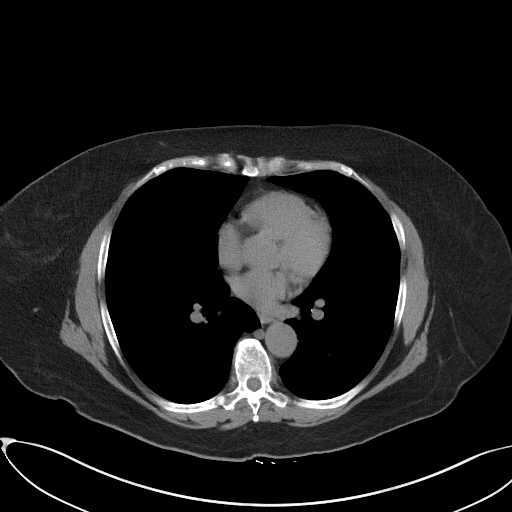

[Series 5: coronal st · coronal · 0.89mm/px · 3 of 117 slices shown]
[im 39/117  soft-tissue]
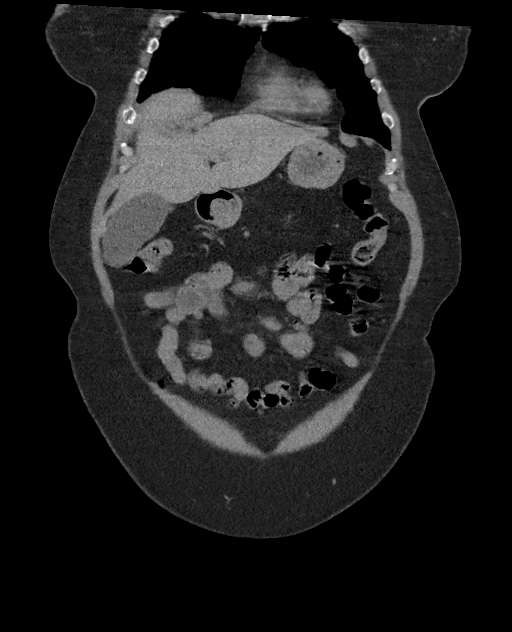
[im 52/117  soft-tissue]
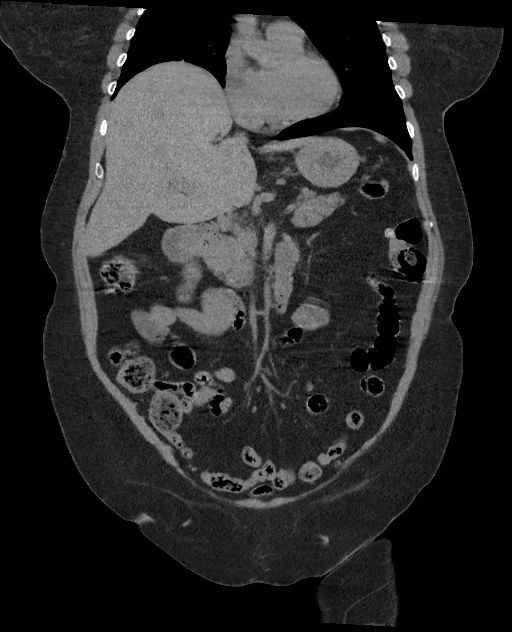
[im 65/117  soft-tissue]
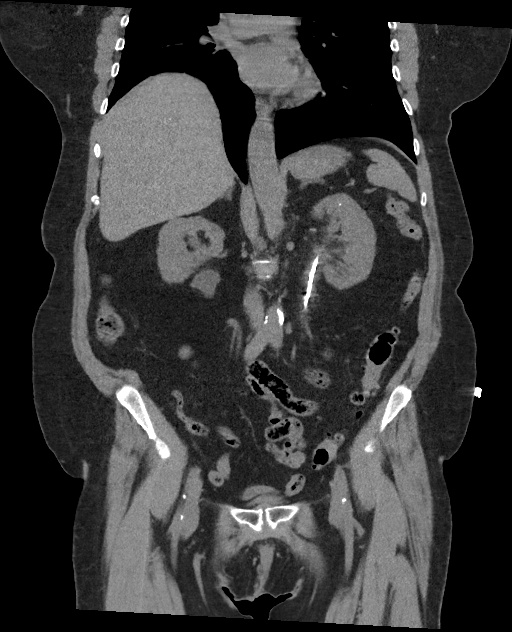

[16 of 46 positions shown; findings below may reference images not displayed]

FINDINGS: Lower chest: No acute abnormality. Unchanged elevation of the right
hemidiaphragm.

Hepatobiliary: No solid liver abnormality is seen. No gallstones,
gallbladder wall thickening, or biliary dilatation.

Pancreas: Unremarkable. No pancreatic ductal dilatation or
surrounding inflammatory changes.

Spleen: Normal in size without significant abnormality.

Adrenals/Urinary Tract: Adrenal glands are unremarkable. Multiple
small bilateral renal calculi, more numerous on the left. Interval
placement of a left-sided double-J ureteral stent, with formed
pigtails in the superior pole of the left kidney and urinary
bladder. There is new fat stranding about the ureter (series 2,
image 43). Bladder is unremarkable.

Stomach/Bowel: Stomach is within normal limits. Appendix appears
normal. No evidence of bowel wall thickening, distention, or
inflammatory changes. Descending and sigmoid diverticulosis.

Vascular/Lymphatic: Aortic atherosclerosis. No enlarged abdominal or
pelvic lymph nodes.

Reproductive: Status post hysterectomy.

Other: No abdominal wall hernia or abnormality. No ascites.

Musculoskeletal: No acute or significant osseous findings.
IMPRESSION: 1. Interval placement of a left-sided double-J ureteral stent, with
formed pigtails in the superior pole of the left kidney and urinary
bladder. Previously seen hydronephrosis and hydroureter is resolved,
however there is new fat stranding about the ureter suggesting
infection or inflammation.
2. Bilateral nonobstructive nephrolithiasis.
3. Descending and sigmoid diverticulosis without evidence of acute
diverticulitis.

Aortic Atherosclerosis ([id number]).

## 2024-05-03 ENCOUNTER — Other Ambulatory Visit: Payer: Self-pay | Admitting: Gastroenterology

## 2024-05-04 DIAGNOSIS — Z23 Encounter for immunization: Secondary | ICD-10-CM | POA: Insufficient documentation

## 2024-05-04 DIAGNOSIS — F322 Major depressive disorder, single episode, severe without psychotic features: Secondary | ICD-10-CM | POA: Insufficient documentation

## 2024-05-04 NOTE — Assessment & Plan Note (Signed)
 Hyperlipidemia:Low fat diet discussed and encouraged.   Lipid Panel  Lab Results  Component Value Date   CHOL 228 (H) 12/06/2023   HDL 43 12/06/2023   LDLCALC 133 (H) 12/06/2023   TRIG 290 (H) 12/06/2023   CHOLHDL 5.3 (H) 12/06/2023     Needs to reduce fat in diet Updated lab needed at/ before next visit.

## 2024-05-04 NOTE — Assessment & Plan Note (Signed)
 Controlled, no change in medication

## 2024-05-04 NOTE — Progress Notes (Signed)
 Cheryl Abbott     MRN: [id number]      DOB: July 07, 1945  Chief Complaint  Patient presents with   Medical Management of Chronic Issues    4 month follow up    Foot Injury    Pt stepped on piece of glass with left foot. Wants to make sure it is okay. No bleeding or swelling or redness     HPI Cheryl Abbott is here for follow up and re-evaluation of chronic medical conditions, medication management and review of any available recent lab and radiology data.  Preventive health is updated, specifically  Cancer screening and Immunization.   Questions or concerns regarding consultations or procedures which the PT has had in the interim are  addressed. The PT denies any adverse reactions to current medications since the last visit.  Denies polyuria, polydipsia, blurred vision , or hypoglycemic episodes. Concern as above ROS Denies recent fever or chills. Denies sinus pressure, nasal congestion, ear pain or sore throat. Denies chest congestion, productive cough or wheezing. Denies chest pains, palpitations and leg swelling Denies abdominal pain, nausea, vomiting,diarrhea or constipation.   Denies dysuria, frequency, hesitancy or incontinence. Denies uncontrolled  joint pain, swelling and limitation in mobility. Denies headaches, seizures, numbness, or tingling. Denies uncontrolled  depression, anxiety or insomnia. Denies skin break down or rash.   PE  BP 122/76   Pulse 99   Resp 16   Ht 5' 6.5 (1.689 m)   Wt 178 lb (80.7 kg)   SpO2 92%   BMI 28.30 kg/m   Patient alert and oriented and in no cardiopulmonary distress.  HEENT: No facial asymmetry, EOMI,     Neck supple .  Chest: Clear to auscultation bilaterally.  CVS: S1, S2 no murmurs, no S3.Regular rate.  ABD: Soft non tender.   Ext: No edema  MS: Adequate ROM spine, shoulders, hips and knees.  Skin: Intact, no ulcerations or rash noted.  Psych: Good eye contact, normal affect. Memory intact not anxious or depressed  appearing.  CNS: CN 2-12 intact, power,  normal throughout.no focal deficits noted.   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   The need to take medication as prescribed, test blood sugar as directed, and to call between visits if there is a concern that blood sugar is uncontrolled is also discussed.   Cheryl Abbott is reminded of the importance of daily foot exam, annual eye examination, and good blood sugar, blood pressure and cholesterol control.     Latest Ref Rng & Units 03/31/2024   12:28 PM 12/06/2023    1:49 PM 09/04/2023    2:30 AM 08/26/2023    8:55 AM 08/05/2023    9:09 AM  Diabetic Labs  HbA1c 4.8 - 5.6 %  5.7      Chol 100 - 199 mg/dL  771    894   HDL >60 mg/dL  43    37   Calc LDL 0 - 99 mg/dL  866    46   Triglycerides 0 - 149 mg/dL  709    875   Creatinine 0.44 - 1.00 mg/dL 8.07  8.07  8.47  8.08  1.39  04/15/2024    1:34 PM 04/07/2024    2:39 PM 02/19/2024    2:26 PM 01/29/2024    2:36 PM 01/02/2024    1:23 PM 01/02/2024    9:39 AM 12/25/2023   11:37 AM  BP/Weight  Systolic BP 122 128 99 124 130 119 112  Diastolic BP 76 76 65 76 68 92 71  Wt. (Lbs) 178 177  182.12   183.42  BMI 28.3 kg/m2 28.14 kg/m2  29.39 kg/m2   29.16 kg/m2      Latest Ref Rng & Units 04/15/2024    1:40 PM 12/12/2023   12:00 AM  Foot/eye exam completion dates  Eye Exam No Retinopathy  No Retinopathy      Foot Form Completion  Done      This result is from an external source.      Controlled, no change in medication   Depression, major, single episode, severe (HCC) Controlled, no change in medication   Hypertension goal BP (blood pressure) < 130/80 Controlled,  no change in medication DASH diet and commitment to daily physical activity for a minimum of 30 minutes discussed and encouraged, as a part of hypertension management. The importance of attaining a healthy weight is also discussed.     04/15/2024    1:34 PM 04/07/2024    2:39 PM 02/19/2024    2:26 PM 01/29/2024    2:36 PM 01/02/2024    1:23 PM 01/02/2024    9:39 AM 12/25/2023   11:37 AM  BP/Weight  Systolic BP 122 128 99 124 130 119 112  Diastolic BP 76 76 65 76 68 92 71  Wt. (Lbs) 178 177  182.12   183.42  BMI 28.3 kg/m2 28.14 kg/m2  29.39 kg/m2   29.16 kg/m2       Urinary incontinence Controlled, no change in medication   Hyperlipidemia LDL goal <100 Hyperlipidemia:Low fat diet discussed and encouraged.   Lipid Panel  Lab Results  Component Value Date   CHOL 228 (H) 12/06/2023   HDL 43 12/06/2023   LDLCALC 133 (H) 12/06/2023   TRIG 290 (H) 12/06/2023   CHOLHDL 5.3 (H) 12/06/2023     Needs to reduce fat in diet Updated lab needed at/ before next visit.   Influenza vaccination administered at current visit After obtaining informed consent, the influenza  vaccine is  administered , with no adverse effect noted at the time of administration.

## 2024-05-04 NOTE — Assessment & Plan Note (Signed)
 Controlled, no change in medication DASH diet and commitment to daily physical activity for a minimum of 30 minutes discussed and encouraged, as a part of hypertension management. The importance of attaining a healthy weight is also discussed.     04/15/2024    1:34 PM 04/07/2024    2:39 PM 02/19/2024    2:26 PM 01/29/2024    2:36 PM 01/02/2024    1:23 PM 01/02/2024    9:39 AM 12/25/2023   11:37 AM  BP/Weight  Systolic BP 122 128 99 124 130 119 112  Diastolic BP 76 76 65 76 68 92 71  Wt. (Lbs) 178 177  182.12   183.42  BMI 28.3 kg/m2 28.14 kg/m2  29.39 kg/m2   29.16 kg/m2

## 2024-05-04 NOTE — Assessment & Plan Note (Signed)
 After obtaining informed consent, the influenza vaccine is  administered , with no adverse effect noted at the time of administration.

## 2024-05-04 NOTE — Assessment & Plan Note (Signed)
 Diabetes associated with hypertension, hyperlipidemia, arthritis, and depression  Cheryl Abbott is reminded of the importance of commitment to daily physical activity for 30 minutes or more, as able and the need to limit carbohydrate intake to 30 to 60 grams per meal to help with blood sugar control.   The need to take medication as prescribed, test blood sugar as directed, and to call between visits if there is a concern that blood sugar is uncontrolled is also discussed.   Cheryl Abbott is reminded of the importance of daily foot exam, annual eye examination, and good blood sugar, blood pressure and cholesterol control.     Latest Ref Rng & Units 03/31/2024   12:28 PM 12/06/2023    1:49 PM 09/04/2023    2:30 AM 08/26/2023    8:55 AM 08/05/2023    9:09 AM  Diabetic Labs  HbA1c 4.8 - 5.6 %  5.7      Chol 100 - 199 mg/dL  771    894   HDL >60 mg/dL  43    37   Calc LDL 0 - 99 mg/dL  866    46   Triglycerides 0 - 149 mg/dL  709    875   Creatinine 0.44 - 1.00 mg/dL 8.07  8.07  8.47  8.08  1.39       04/15/2024    1:34 PM 04/07/2024    2:39 PM 02/19/2024    2:26 PM 01/29/2024    2:36 PM 01/02/2024    1:23 PM 01/02/2024    9:39 AM 12/25/2023   11:37 AM  BP/Weight  Systolic BP 122 128 99 124 130 119 112  Diastolic BP 76 76 65 76 68 92 71  Wt. (Lbs) 178 177  182.12   183.42  BMI 28.3 kg/m2 28.14 kg/m2  29.39 kg/m2   29.16 kg/m2      Latest Ref Rng & Units 04/15/2024    1:40 PM 12/12/2023   12:00 AM  Foot/eye exam completion dates  Eye Exam No Retinopathy  No Retinopathy      Foot Form Completion  Done      This result is from an external source.      Controlled, no change in medication

## 2024-05-10 IMAGING — DX DG BONE SURVEY MET
10 series · 10 of 10 positions shown · non-contrast
Comparison: 02/16/2021.

CLINICAL DATA: Smoldering myeloma.

EXAM:
METASTATIC BONE SURVEY

[skull lat]
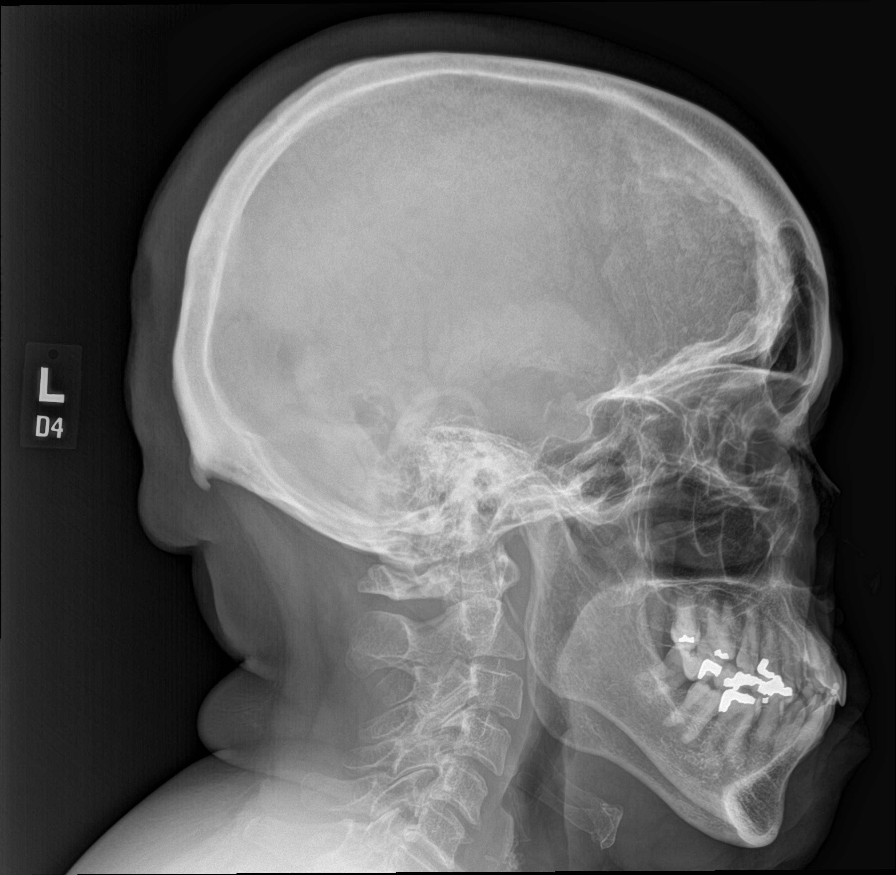

[shoulder ap (1 of 2)]
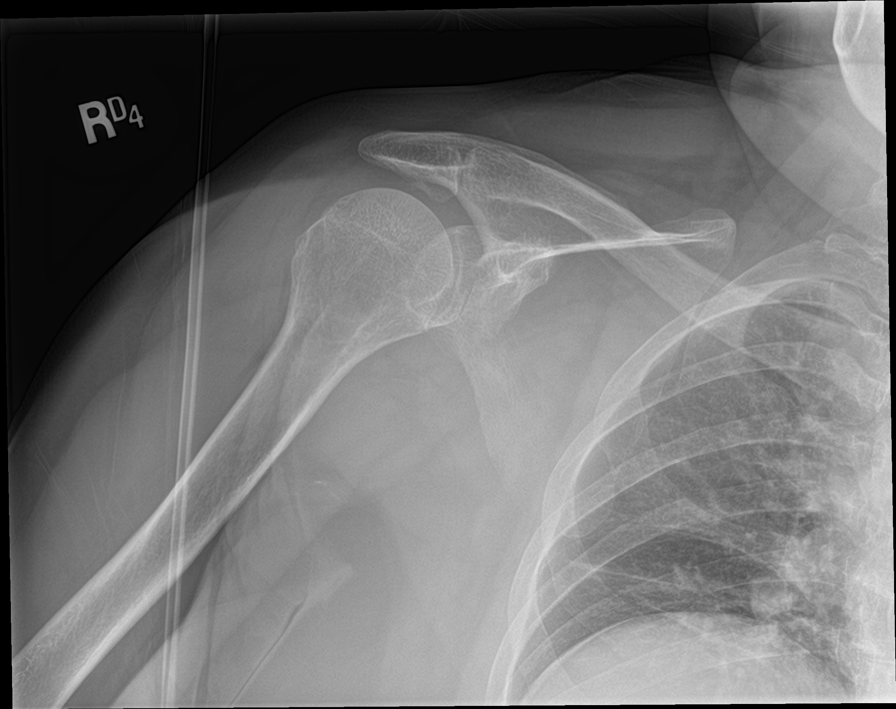

[shoulder ap (2 of 2)]
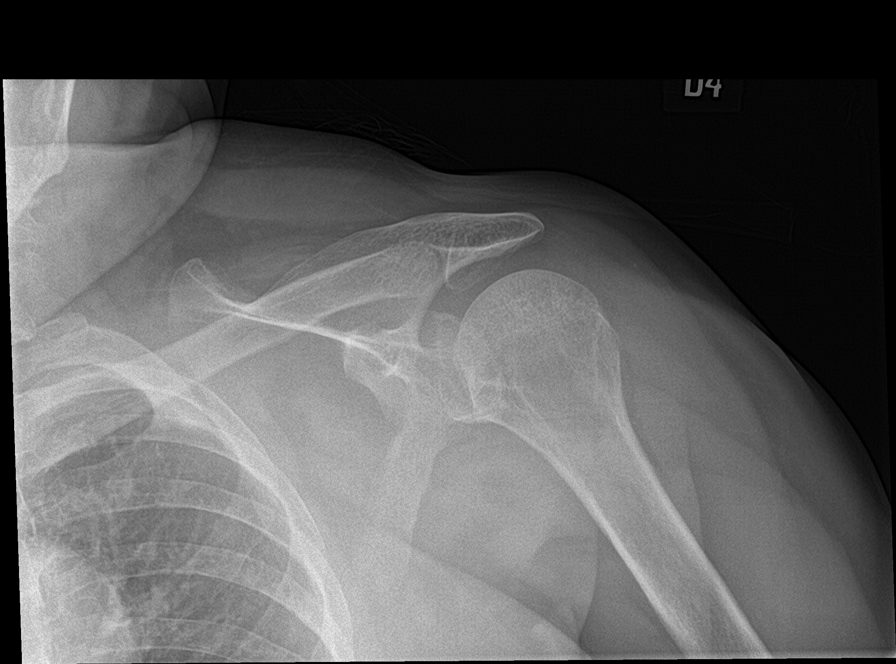

[humerus ap (1 of 2)]
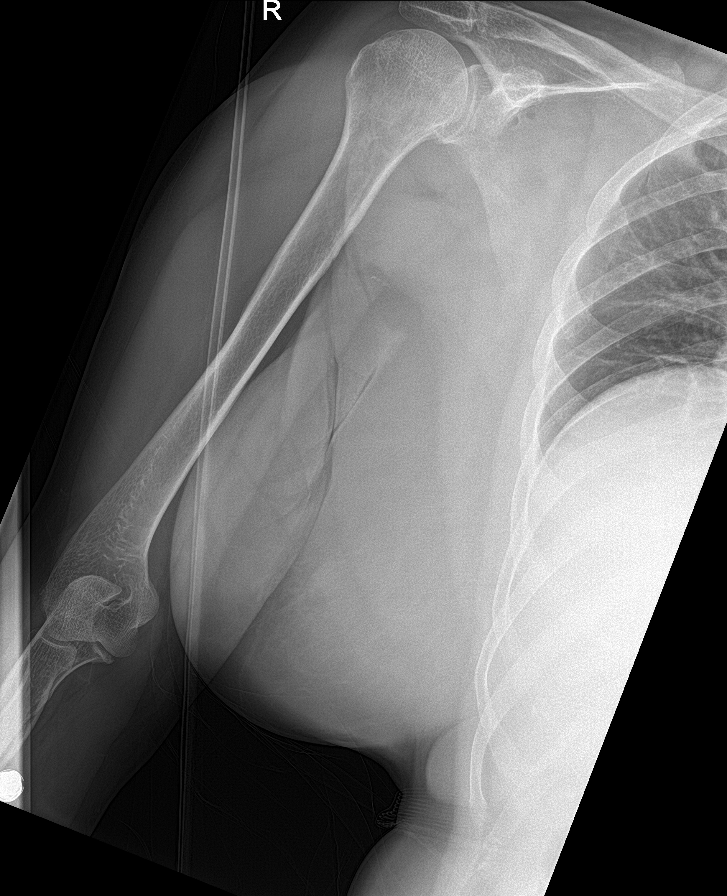

[humerus ap (2 of 2)]
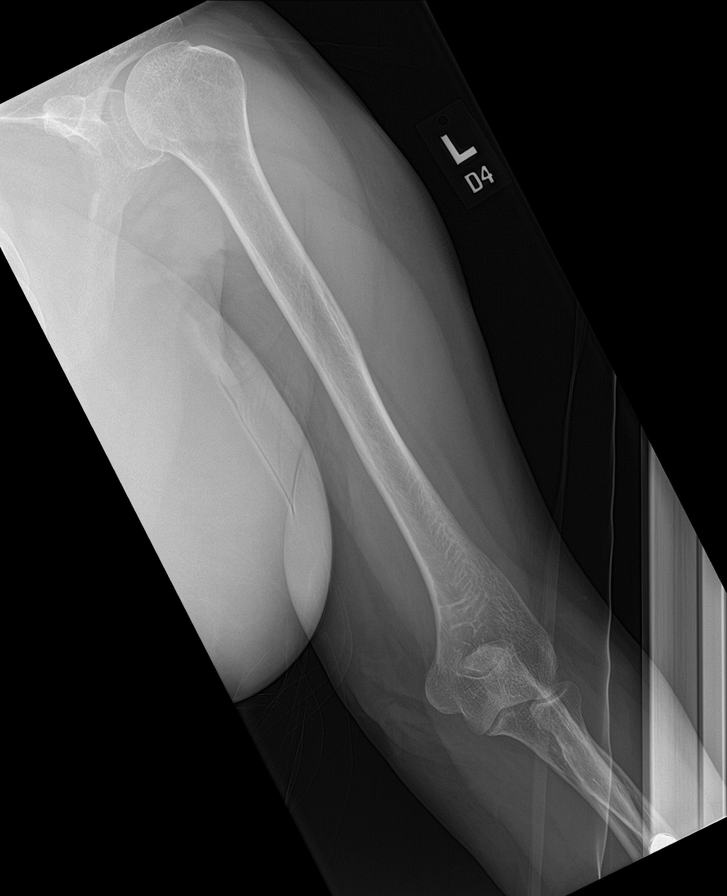

[forearm ap (1 of 2)]
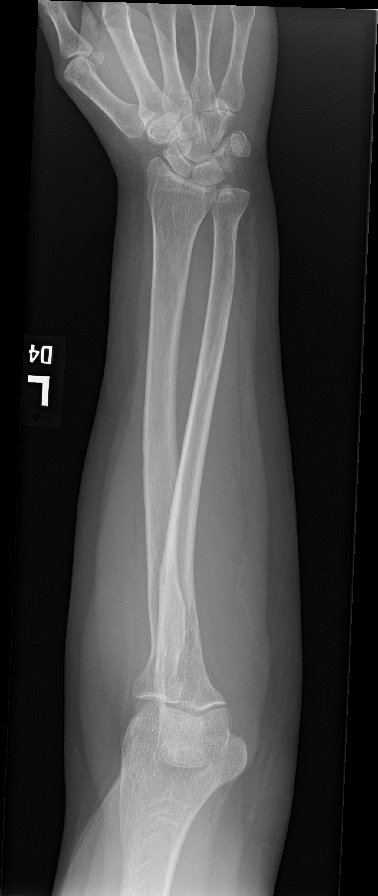

[forearm ap (2 of 2)]
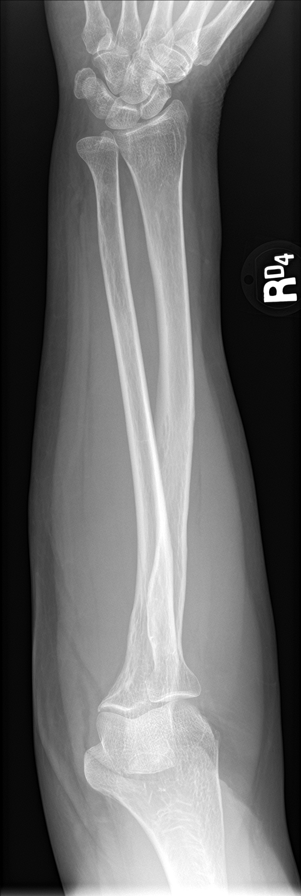

[c-spine ap]
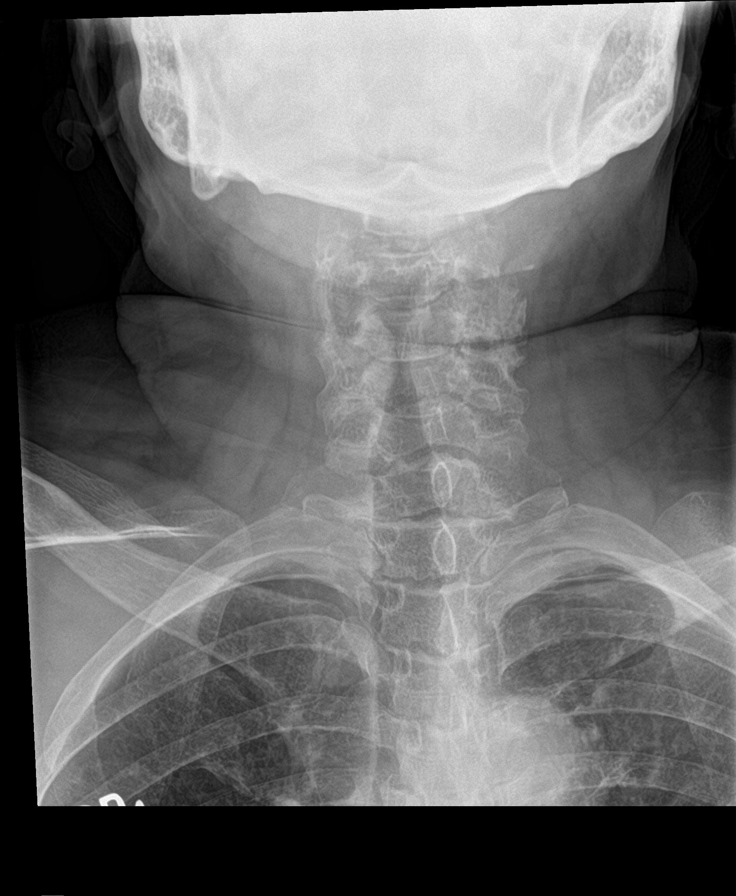

[c-spine lat]
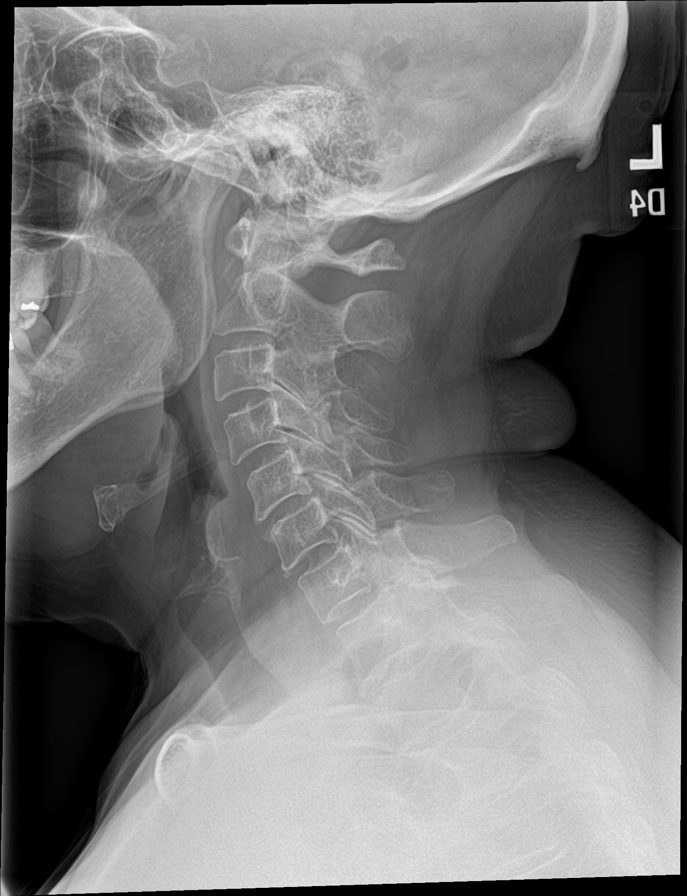

[t-spine lat]
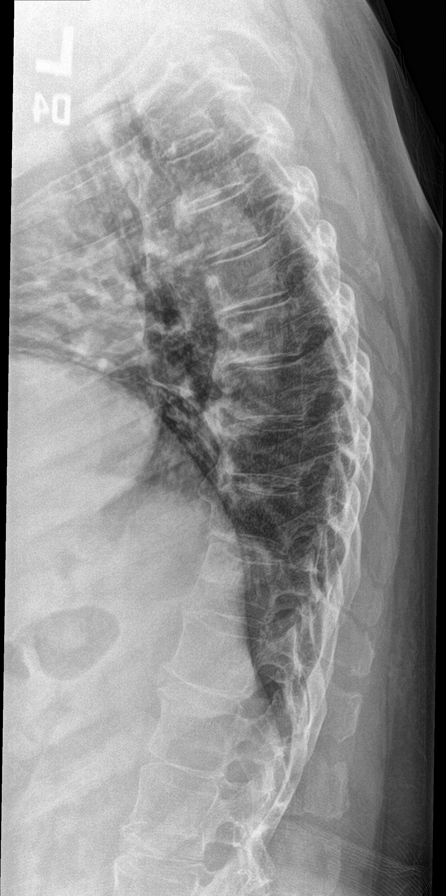

[10 of 10 positions shown; findings below may reference images not displayed]

FINDINGS: There is redemonstration of a vague lucencies at the base of the
glenoid on the right measuring up to 4 mm and unchanged from the
prior exam. No new lytic or destructive lesion is seen in the bones.
Degenerative changes are present in the cervical, thoracic, and
lumbar spine. There is atherosclerotic calcification of the aorta.
No acute abnormality is identified.
IMPRESSION: Stable lucencies in the scapula on the right. No new lytic or
destructive lesion is seen.

## 2024-05-11 ENCOUNTER — Other Ambulatory Visit: Payer: Self-pay | Admitting: Family Medicine

## 2024-05-13 ENCOUNTER — Telehealth: Payer: Self-pay | Admitting: Family Medicine

## 2024-05-13 NOTE — Telephone Encounter (Signed)
 Prescription Request  05/13/2024  LOV: 04/15/2024  What is the name of the medication or equipment? Needles for TRULICITY   0.75 mg refill  senna (SENOKOT) 8.6 MG TABS tablet [[id number]]   Have you contacted your pharmacy to request a refill? Yes   Which pharmacy would you like this sent to?  [id number] - [place], [place] - [address] AT [facility] [address] [place] [address] Phone: [phone] Fax: [phone]    Patient notified that their request is being sent to the clinical staff for review and that they should receive a response within 2 business days.   Please advise at walked into office

## 2024-05-14 ENCOUNTER — Telehealth: Payer: Self-pay

## 2024-05-14 ENCOUNTER — Other Ambulatory Visit: Payer: Self-pay | Admitting: Family Medicine

## 2024-05-14 ENCOUNTER — Other Ambulatory Visit: Payer: Self-pay

## 2024-05-14 DIAGNOSIS — E1121 Type 2 diabetes mellitus with diabetic nephropathy: Secondary | ICD-10-CM

## 2024-05-14 DIAGNOSIS — E785 Hyperlipidemia, unspecified: Secondary | ICD-10-CM

## 2024-05-14 DIAGNOSIS — N183 Chronic kidney disease, stage 3 unspecified: Secondary | ICD-10-CM

## 2024-05-14 NOTE — Telephone Encounter (Signed)
 Lvm to cb. Senokot sent 11/24 with 3 refills by [facility]. Trulicity  comes with its on pen needle. Needles will not be sent.

## 2024-05-14 NOTE — Telephone Encounter (Signed)
 I do n ot see trulicity  on c

## 2024-05-14 NOTE — Telephone Encounter (Signed)
 Copied from CRM #[id number]. Topic: General - Other >> May 14, 2024 12:01 PM Antony RAMAN wrote: Reason for CRM: returning a call for Vicci Stefano MATSU, CMA

## 2024-05-14 NOTE — Telephone Encounter (Signed)
 P[ls check pharmacy re trulicty, when last filled and the dose not seeing it on current med list, ok trulicity  only and no tradjenta , will remove tradjenta  after I hear from  you

## 2024-05-15 ENCOUNTER — Encounter: Payer: Self-pay | Admitting: Family Medicine

## 2024-05-15 ENCOUNTER — Ambulatory Visit: Payer: Self-pay | Admitting: Family Medicine

## 2024-05-15 ENCOUNTER — Other Ambulatory Visit: Payer: Self-pay | Admitting: Family Medicine

## 2024-05-15 LAB — LIPID PANEL
Chol/HDL Ratio: 4.5 ratio — ABNORMAL HIGH (ref 0.0–4.4)
Cholesterol, Total: 206 mg/dL — ABNORMAL HIGH (ref 100–199)
HDL: 46 mg/dL (ref >39)
LDL Chol Calc (NIH): 136 mg/dL — ABNORMAL HIGH (ref 0–99)
Triglycerides: 133 mg/dL (ref 0–149)
VLDL Cholesterol Cal: 24 mg/dL (ref 5–40)

## 2024-05-15 LAB — HEPATIC FUNCTION PANEL
ALT: 10 IU/L (ref 0–32)
AST: 14 IU/L (ref 0–40)
Albumin: 4.4 g/dL (ref 3.8–4.8)
Alkaline Phosphatase: 86 IU/L (ref 49–135)
Bilirubin Total: 0.4 mg/dL (ref 0.0–1.2)
Bilirubin, Direct: <0.08 mg/dL (ref 0.00–0.40)
Total Protein: 8 g/dL (ref 6.0–8.5)

## 2024-05-15 MED ORDER — ROSUVASTATIN CALCIUM 10 MG PO TABS: 10.0000 mg | ORAL_TABLET | Freq: Every day | ORAL | 4 refills | Status: AC

## 2024-05-15 NOTE — Progress Notes (Signed)
 New is crestor  , will recheck in 4 months

## 2024-05-16 LAB — MICROALBUMIN / CREATININE URINE RATIO
Creatinine, Urine: 55.2 mg/dL
Microalb/Creat Ratio: 76 mg/g{creat} — ABNORMAL HIGH (ref 0–29)
Microalbumin, Urine: 41.7 ug/mL

## 2024-05-18 MED ORDER — LINAGLIPTIN 5 MG PO TABS: 5.0000 mg | ORAL_TABLET | Freq: Every day | ORAL | 5 refills | Status: AC

## 2024-05-21 NOTE — Progress Notes (Unsigned)
 TRtrulicity discontinued , pt on tradjemta only

## 2024-05-25 ENCOUNTER — Other Ambulatory Visit: Payer: Self-pay | Admitting: Family Medicine

## 2024-05-27 ENCOUNTER — Other Ambulatory Visit (HOSPITAL_COMMUNITY): Admission: RE | Admit: 2024-05-27 | Source: Ambulatory Visit | Admitting: Nephrology

## 2024-06-15 ENCOUNTER — Other Ambulatory Visit (HOSPITAL_COMMUNITY)
Admission: RE | Admit: 2024-06-15 | Discharge: 2024-06-15 | Disposition: A | Discharge status: Home / Self Care | Source: Ambulatory Visit | Attending: Nephrology | Admitting: Nephrology

## 2024-06-15 ENCOUNTER — Other Ambulatory Visit: Payer: Self-pay | Admitting: Orthopedic Surgery

## 2024-06-15 ENCOUNTER — Other Ambulatory Visit: Payer: Self-pay | Admitting: Internal Medicine

## 2024-06-15 ENCOUNTER — Other Ambulatory Visit: Payer: Self-pay | Admitting: Family Medicine

## 2024-06-15 DIAGNOSIS — F419 Anxiety disorder, unspecified: Secondary | ICD-10-CM

## 2024-06-15 DIAGNOSIS — M25512 Pain in left shoulder: Secondary | ICD-10-CM

## 2024-06-15 DIAGNOSIS — N184 Chronic kidney disease, stage 4 (severe): Secondary | ICD-10-CM | POA: Insufficient documentation

## 2024-06-15 LAB — RENAL FUNCTION PANEL
Albumin: 4.3 g/dL (ref 3.5–5.0)
Anion gap: 7 (ref 5–15)
BUN: 19 mg/dL (ref 8–23)
CO2: 26 mmol/L (ref 22–32)
Calcium: 9.6 mg/dL (ref 8.9–10.3)
Chloride: 105 mmol/L (ref 98–111)
Creatinine, Ser: 1.58 mg/dL — ABNORMAL HIGH (ref 0.44–1.00)
GFR, Estimated: 33 mL/min — ABNORMAL LOW
Glucose, Bld: 95 mg/dL (ref 70–99)
Phosphorus: 3.5 mg/dL (ref 2.5–4.6)
Potassium: 4.6 mmol/L (ref 3.5–5.1)
Sodium: 138 mmol/L (ref 135–145)

## 2024-06-30 ENCOUNTER — Ambulatory Visit (HOSPITAL_COMMUNITY)
Admission: RE | Admit: 2024-06-30 | Discharge: 2024-06-30 | Disposition: A | Discharge status: Home / Self Care | Source: Ambulatory Visit | Attending: Family Medicine | Admitting: Family Medicine

## 2024-06-30 ENCOUNTER — Other Ambulatory Visit: Payer: Self-pay | Admitting: Family Medicine

## 2024-06-30 ENCOUNTER — Other Ambulatory Visit (HOSPITAL_COMMUNITY): Payer: Self-pay | Admitting: Family Medicine

## 2024-06-30 ENCOUNTER — Ambulatory Visit (HOSPITAL_COMMUNITY)
Admission: RE | Admit: 2024-06-30 | Discharge: 2024-06-30 | Disposition: A | Source: Ambulatory Visit | Attending: Family Medicine | Admitting: Family Medicine

## 2024-06-30 ENCOUNTER — Encounter (HOSPITAL_COMMUNITY): Payer: Self-pay

## 2024-06-30 DIAGNOSIS — Z853 Personal history of malignant neoplasm of breast: Secondary | ICD-10-CM | POA: Insufficient documentation

## 2024-07-07 ENCOUNTER — Inpatient Hospital Stay: Attending: Hematology

## 2024-07-14 ENCOUNTER — Inpatient Hospital Stay: Admitting: Oncology

## 2024-07-15 ENCOUNTER — Other Ambulatory Visit: Payer: Self-pay | Admitting: Orthopedic Surgery

## 2024-07-15 DIAGNOSIS — M25512 Pain in left shoulder: Secondary | ICD-10-CM

## 2024-08-11 ENCOUNTER — Other Ambulatory Visit (HOSPITAL_COMMUNITY)

## 2024-08-21 ENCOUNTER — Ambulatory Visit: Admitting: Urology

## 2024-09-16 ENCOUNTER — Ambulatory Visit: Admitting: Family Medicine

## 2025-02-02 ENCOUNTER — Ambulatory Visit
